# Patient Record
Sex: Male | Born: 1965
Health system: Southern US, Community
[De-identification: ages and names within clinical notes are randomized; demographics above are authoritative.]

## PROBLEM LIST (undated history)

## (undated) DIAGNOSIS — L309 Dermatitis, unspecified: Secondary | ICD-10-CM

## (undated) DIAGNOSIS — E11621 Type 2 diabetes mellitus with foot ulcer: Secondary | ICD-10-CM

## (undated) DIAGNOSIS — I219 Acute myocardial infarction, unspecified: Secondary | ICD-10-CM

## (undated) DIAGNOSIS — M86171 Other acute osteomyelitis, right ankle and foot: Secondary | ICD-10-CM

## (undated) DIAGNOSIS — A419 Sepsis, unspecified organism: Secondary | ICD-10-CM

## (undated) DIAGNOSIS — T8859XA Other complications of anesthesia, initial encounter: Secondary | ICD-10-CM

## (undated) DIAGNOSIS — K219 Gastro-esophageal reflux disease without esophagitis: Secondary | ICD-10-CM

## (undated) DIAGNOSIS — M869 Osteomyelitis, unspecified: Secondary | ICD-10-CM

## (undated) DIAGNOSIS — Z22322 Carrier or suspected carrier of Methicillin resistant Staphylococcus aureus: Secondary | ICD-10-CM

## (undated) DIAGNOSIS — E162 Hypoglycemia, unspecified: Secondary | ICD-10-CM

## (undated) DIAGNOSIS — I739 Peripheral vascular disease, unspecified: Secondary | ICD-10-CM

## (undated) DIAGNOSIS — L97514 Non-pressure chronic ulcer of other part of right foot with necrosis of bone: Secondary | ICD-10-CM

## (undated) DIAGNOSIS — D649 Anemia, unspecified: Secondary | ICD-10-CM

## (undated) DIAGNOSIS — L03115 Cellulitis of right lower limb: Secondary | ICD-10-CM

## (undated) DIAGNOSIS — R5383 Other fatigue: Secondary | ICD-10-CM

## (undated) DIAGNOSIS — L97909 Non-pressure chronic ulcer of unspecified part of unspecified lower leg with unspecified severity: Secondary | ICD-10-CM

## (undated) DIAGNOSIS — T4145XA Adverse effect of unspecified anesthetic, initial encounter: Secondary | ICD-10-CM

## (undated) DIAGNOSIS — R6883 Chills (without fever): Secondary | ICD-10-CM

## (undated) DIAGNOSIS — N186 End stage renal disease: Secondary | ICD-10-CM

## (undated) DIAGNOSIS — I350 Nonrheumatic aortic (valve) stenosis: Secondary | ICD-10-CM

## (undated) DIAGNOSIS — I2510 Atherosclerotic heart disease of native coronary artery without angina pectoris: Secondary | ICD-10-CM

## (undated) DIAGNOSIS — E11628 Type 2 diabetes mellitus with other skin complications: Secondary | ICD-10-CM

## (undated) DIAGNOSIS — N289 Disorder of kidney and ureter, unspecified: Secondary | ICD-10-CM

## (undated) DIAGNOSIS — M719 Bursopathy, unspecified: Secondary | ICD-10-CM

## (undated) DIAGNOSIS — R112 Nausea with vomiting, unspecified: Secondary | ICD-10-CM

## (undated) DIAGNOSIS — R011 Cardiac murmur, unspecified: Secondary | ICD-10-CM

## (undated) DIAGNOSIS — Z9889 Other specified postprocedural states: Secondary | ICD-10-CM

## (undated) DIAGNOSIS — L089 Local infection of the skin and subcutaneous tissue, unspecified: Secondary | ICD-10-CM

## (undated) DIAGNOSIS — R0989 Other specified symptoms and signs involving the circulatory and respiratory systems: Secondary | ICD-10-CM

## (undated) DIAGNOSIS — I10 Essential (primary) hypertension: Secondary | ICD-10-CM

## (undated) DIAGNOSIS — I96 Gangrene, not elsewhere classified: Secondary | ICD-10-CM

## (undated) DIAGNOSIS — I272 Pulmonary hypertension, unspecified: Secondary | ICD-10-CM

## (undated) DIAGNOSIS — Z992 Dependence on renal dialysis: Secondary | ICD-10-CM

## (undated) DIAGNOSIS — N185 Chronic kidney disease, stage 5: Secondary | ICD-10-CM

## (undated) DIAGNOSIS — H338 Other retinal detachments: Secondary | ICD-10-CM

## (undated) DIAGNOSIS — R51 Headache: Secondary | ICD-10-CM

## (undated) DIAGNOSIS — Z8489 Family history of other specified conditions: Secondary | ICD-10-CM

## (undated) DIAGNOSIS — Z9289 Personal history of other medical treatment: Secondary | ICD-10-CM

## (undated) DIAGNOSIS — R9431 Abnormal electrocardiogram [ECG] [EKG]: Secondary | ICD-10-CM

## (undated) DIAGNOSIS — N2581 Secondary hyperparathyroidism of renal origin: Secondary | ICD-10-CM

## (undated) DIAGNOSIS — E785 Hyperlipidemia, unspecified: Secondary | ICD-10-CM

## (undated) DIAGNOSIS — L02611 Cutaneous abscess of right foot: Secondary | ICD-10-CM

## (undated) DIAGNOSIS — B957 Other staphylococcus as the cause of diseases classified elsewhere: Secondary | ICD-10-CM

## (undated) DIAGNOSIS — E669 Obesity, unspecified: Secondary | ICD-10-CM

## (undated) DIAGNOSIS — N259 Disorder resulting from impaired renal tubular function, unspecified: Secondary | ICD-10-CM

## (undated) DIAGNOSIS — E11319 Type 2 diabetes mellitus with unspecified diabetic retinopathy without macular edema: Secondary | ICD-10-CM

## (undated) DIAGNOSIS — M1990 Unspecified osteoarthritis, unspecified site: Secondary | ICD-10-CM

## (undated) DIAGNOSIS — E119 Type 2 diabetes mellitus without complications: Secondary | ICD-10-CM

## (undated) DIAGNOSIS — R079 Chest pain, unspecified: Secondary | ICD-10-CM

## (undated) DIAGNOSIS — R739 Hyperglycemia, unspecified: Secondary | ICD-10-CM

## (undated) HISTORY — DX: Cutaneous abscess of right foot: L02.611

## (undated) HISTORY — DX: Essential (primary) hypertension: I10

## (undated) HISTORY — DX: Carrier or suspected carrier of methicillin resistant Staphylococcus aureus: Z22.322

## (undated) HISTORY — DX: Other fatigue: R53.83

## (undated) HISTORY — PX: CARDIAC CATHETERIZATION: SHX172

## (undated) HISTORY — DX: Other specified symptoms and signs involving the circulatory and respiratory systems: R09.89

## (undated) HISTORY — DX: Non-pressure chronic ulcer of other part of right foot with necrosis of bone: L97.514

## (undated) HISTORY — DX: Chills (without fever): R68.83

## (undated) HISTORY — PX: EYE SURGERY: SHX253

## (undated) HISTORY — PX: SOFT TISSUE MASS EXCISION: SHX2419

## (undated) HISTORY — DX: Gangrene, not elsewhere classified: I96

## (undated) HISTORY — DX: Type 2 diabetes mellitus with other skin complications: E11.628

## (undated) HISTORY — DX: Type 2 diabetes mellitus with other skin complications: L08.9

## (undated) HISTORY — DX: Local infection of the skin and subcutaneous tissue, unspecified: L08.9

## (undated) SURGERY — Surgical Case
Anesthesia: *Unknown

---
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
## 2007-02-24 ENCOUNTER — Encounter: Payer: Self-pay | Admitting: Endocrinology

## 2007-08-16 ENCOUNTER — Ambulatory Visit (HOSPITAL_COMMUNITY): Admission: RE | Admit: 2007-08-16 | Discharge: 2007-08-16 | Payer: Self-pay | Admitting: Ophthalmology

## 2007-08-30 ENCOUNTER — Ambulatory Visit (HOSPITAL_COMMUNITY): Admission: RE | Admit: 2007-08-30 | Discharge: 2007-08-31 | Payer: Self-pay | Admitting: Ophthalmology

## 2007-09-01 ENCOUNTER — Emergency Department (HOSPITAL_COMMUNITY): Admission: EM | Admit: 2007-09-01 | Discharge: 2007-09-01 | Payer: Self-pay | Admitting: Emergency Medicine

## 2007-09-02 ENCOUNTER — Encounter: Payer: Self-pay | Admitting: Endocrinology

## 2007-09-02 ENCOUNTER — Emergency Department (HOSPITAL_COMMUNITY): Admission: EM | Admit: 2007-09-02 | Discharge: 2007-09-02 | Payer: Self-pay | Admitting: Family Medicine

## 2007-10-18 ENCOUNTER — Ambulatory Visit: Payer: Self-pay | Admitting: Endocrinology

## 2007-10-18 DIAGNOSIS — E119 Type 2 diabetes mellitus without complications: Secondary | ICD-10-CM

## 2007-10-18 DIAGNOSIS — I10 Essential (primary) hypertension: Secondary | ICD-10-CM | POA: Insufficient documentation

## 2007-10-18 DIAGNOSIS — B957 Other staphylococcus as the cause of diseases classified elsewhere: Secondary | ICD-10-CM

## 2007-10-18 HISTORY — DX: Essential (primary) hypertension: I10

## 2007-10-18 HISTORY — DX: Other staphylococcus as the cause of diseases classified elsewhere: B95.7

## 2007-10-18 LAB — CONVERTED CEMR LAB
BUN: 31 mg/dL — ABNORMAL HIGH (ref 6–23)
Chloride: 103 meq/L (ref 96–112)
Cholesterol: 226 mg/dL (ref 0–200)
Direct LDL: 92.5 mg/dL
GFR calc Af Amer: 57 mL/min
Glucose, Bld: 305 mg/dL — ABNORMAL HIGH (ref 70–99)
HDL: 42.5 mg/dL (ref >39.0)
Hgb A1c MFr Bld: 11.2 % — ABNORMAL HIGH (ref 4.6–6.0)
Potassium: 4.9 meq/L (ref 3.5–5.1)
TSH: 1.17 microintl units/mL (ref 0.35–5.50)
Total CHOL/HDL Ratio: 5.3

## 2007-11-01 ENCOUNTER — Ambulatory Visit: Payer: Self-pay | Admitting: Endocrinology

## 2007-11-01 LAB — CONVERTED CEMR LAB
Protein, U semiquant: >=300
WBC Urine, dipstick: NEGATIVE
pH: 5

## 2008-05-10 ENCOUNTER — Encounter: Payer: Self-pay | Admitting: Internal Medicine

## 2008-05-16 ENCOUNTER — Ambulatory Visit: Payer: Self-pay | Admitting: Internal Medicine

## 2008-05-16 DIAGNOSIS — E785 Hyperlipidemia, unspecified: Secondary | ICD-10-CM

## 2008-05-16 DIAGNOSIS — E782 Mixed hyperlipidemia: Secondary | ICD-10-CM | POA: Insufficient documentation

## 2008-05-16 HISTORY — DX: Hyperlipidemia, unspecified: E78.5

## 2008-05-17 LAB — CONVERTED CEMR LAB
ALT: 22 units/L (ref 0–53)
AST: 16 units/L (ref 0–37)
Alkaline Phosphatase: 121 units/L — ABNORMAL HIGH (ref 39–117)
Bacteria, UA: NEGATIVE
Bilirubin Urine: NEGATIVE
Bilirubin, Direct: 0.1 mg/dL (ref 0.0–0.3)
CO2: 27 meq/L (ref 19–32)
Chloride: 102 meq/L (ref 96–112)
Cholesterol: 302 mg/dL (ref 0–200)
GFR calc non Af Amer: 42 mL/min
HDL: 45.5 mg/dL (ref >39.0)
Hemoglobin: 12 g/dL — ABNORMAL LOW (ref 13.0–17.0)
Hgb A1c MFr Bld: 10.1 % — ABNORMAL HIGH (ref 4.6–6.0)
Leukocytes, UA: NEGATIVE
Mucus, UA: NEGATIVE
PSA: 0.21 ng/mL (ref 0.10–4.00)
RBC: 4.37 M/uL (ref 4.22–5.81)
Total Bilirubin: 0.7 mg/dL (ref 0.3–1.2)
Total Protein: 6.9 g/dL (ref 6.0–8.3)
Urobilinogen, UA: 0.2 (ref 0.0–1.0)
VLDL: 127 mg/dL — ABNORMAL HIGH (ref 0–40)
pH: 5.5 (ref 5.0–8.0)

## 2008-05-19 LAB — CONVERTED CEMR LAB: Vit D, 1,25-Dihydroxy: 11 — ABNORMAL LOW (ref 30–89)

## 2008-06-29 ENCOUNTER — Ambulatory Visit: Payer: Self-pay | Admitting: Endocrinology

## 2008-06-29 DIAGNOSIS — L97909 Non-pressure chronic ulcer of unspecified part of unspecified lower leg with unspecified severity: Secondary | ICD-10-CM

## 2008-06-29 HISTORY — DX: Non-pressure chronic ulcer of unspecified part of unspecified lower leg with unspecified severity: L97.909

## 2008-07-03 ENCOUNTER — Telehealth (INDEPENDENT_AMBULATORY_CARE_PROVIDER_SITE_OTHER): Payer: Self-pay | Admitting: *Deleted

## 2008-07-06 ENCOUNTER — Telehealth (INDEPENDENT_AMBULATORY_CARE_PROVIDER_SITE_OTHER): Payer: Self-pay | Admitting: *Deleted

## 2008-07-06 ENCOUNTER — Emergency Department (HOSPITAL_COMMUNITY): Admission: EM | Admit: 2008-07-06 | Discharge: 2008-07-07 | Payer: Self-pay | Admitting: Emergency Medicine

## 2008-07-28 ENCOUNTER — Emergency Department (HOSPITAL_COMMUNITY): Admission: EM | Admit: 2008-07-28 | Discharge: 2008-07-28 | Payer: Self-pay | Admitting: Emergency Medicine

## 2008-07-29 ENCOUNTER — Telehealth (INDEPENDENT_AMBULATORY_CARE_PROVIDER_SITE_OTHER): Payer: Self-pay | Admitting: *Deleted

## 2008-07-29 ENCOUNTER — Emergency Department (HOSPITAL_COMMUNITY): Admission: EM | Admit: 2008-07-29 | Discharge: 2008-07-29 | Payer: Self-pay | Admitting: Emergency Medicine

## 2008-07-31 ENCOUNTER — Telehealth (INDEPENDENT_AMBULATORY_CARE_PROVIDER_SITE_OTHER): Payer: Self-pay | Admitting: *Deleted

## 2008-07-31 ENCOUNTER — Ambulatory Visit: Payer: Self-pay | Admitting: Internal Medicine

## 2008-07-31 DIAGNOSIS — N259 Disorder resulting from impaired renal tubular function, unspecified: Secondary | ICD-10-CM | POA: Insufficient documentation

## 2008-08-01 LAB — CONVERTED CEMR LAB
Cholesterol: 207 mg/dL (ref 0–200)
Direct LDL: 83.6 mg/dL
GFR calc Af Amer: 45 mL/min
GFR calc non Af Amer: 37 mL/min
HDL: 41.8 mg/dL (ref >39.0)
Hgb A1c MFr Bld: 10.6 % — ABNORMAL HIGH (ref 4.6–6.0)
Sodium: 137 meq/L (ref 135–145)
Triglycerides: 421 mg/dL (ref 0–149)

## 2008-08-09 ENCOUNTER — Encounter (INDEPENDENT_AMBULATORY_CARE_PROVIDER_SITE_OTHER): Payer: Self-pay | Admitting: Emergency Medicine

## 2008-08-09 ENCOUNTER — Ambulatory Visit: Payer: Self-pay | Admitting: Internal Medicine

## 2008-08-09 ENCOUNTER — Ambulatory Visit: Payer: Self-pay | Admitting: Vascular Surgery

## 2008-08-09 ENCOUNTER — Inpatient Hospital Stay (HOSPITAL_COMMUNITY): Admission: EM | Admit: 2008-08-09 | Discharge: 2008-08-13 | Payer: Self-pay | Admitting: Emergency Medicine

## 2008-08-13 ENCOUNTER — Encounter: Payer: Self-pay | Admitting: Endocrinology

## 2008-09-13 ENCOUNTER — Telehealth: Payer: Self-pay | Admitting: Internal Medicine

## 2008-10-05 ENCOUNTER — Encounter: Payer: Self-pay | Admitting: Internal Medicine

## 2008-11-13 ENCOUNTER — Ambulatory Visit: Payer: Self-pay | Admitting: Internal Medicine

## 2008-11-13 DIAGNOSIS — E162 Hypoglycemia, unspecified: Secondary | ICD-10-CM

## 2008-11-13 HISTORY — DX: Hypoglycemia, unspecified: E16.2

## 2008-12-11 LAB — CONVERTED CEMR LAB
Cholesterol: 265 mg/dL (ref 0–200)
Creatinine, Ser: 2.2 mg/dL — ABNORMAL HIGH (ref 0.4–1.5)
GFR calc Af Amer: 42 mL/min
GFR calc non Af Amer: 35 mL/min
HDL: 50.5 mg/dL (ref >39.0)
Hgb A1c MFr Bld: 8.5 % — ABNORMAL HIGH (ref 4.6–6.0)

## 2009-02-17 ENCOUNTER — Emergency Department (HOSPITAL_COMMUNITY): Admission: EM | Admit: 2009-02-17 | Discharge: 2009-02-17 | Payer: Self-pay | Admitting: Emergency Medicine

## 2009-02-17 ENCOUNTER — Encounter (INDEPENDENT_AMBULATORY_CARE_PROVIDER_SITE_OTHER): Payer: Self-pay | Admitting: Emergency Medicine

## 2009-02-17 ENCOUNTER — Ambulatory Visit: Payer: Self-pay | Admitting: Vascular Surgery

## 2009-05-22 ENCOUNTER — Telehealth: Payer: Self-pay | Admitting: Internal Medicine

## 2009-08-28 ENCOUNTER — Inpatient Hospital Stay (HOSPITAL_COMMUNITY): Admission: EM | Admit: 2009-08-28 | Discharge: 2009-09-04 | Payer: Self-pay | Admitting: Emergency Medicine

## 2009-09-03 ENCOUNTER — Ambulatory Visit: Payer: Self-pay | Admitting: Vascular Surgery

## 2009-09-03 ENCOUNTER — Encounter (INDEPENDENT_AMBULATORY_CARE_PROVIDER_SITE_OTHER): Payer: Self-pay | Admitting: Nephrology

## 2010-03-20 ENCOUNTER — Telehealth (INDEPENDENT_AMBULATORY_CARE_PROVIDER_SITE_OTHER): Payer: Self-pay | Admitting: *Deleted

## 2010-11-19 NOTE — Progress Notes (Signed)
  Phone Note Other Incoming   Request: Send information Summary of Call: Request for records received from [facility]. Request forwarded to [facility].     

## 2011-01-22 LAB — CBC
HCT: 24.1 % — ABNORMAL LOW (ref 39.0–52.0)
HCT: 25.7 % — ABNORMAL LOW (ref 39.0–52.0)
Hemoglobin: 8.5 g/dL — ABNORMAL LOW (ref 13.0–17.0)
Hemoglobin: 8.2 g/dL — ABNORMAL LOW (ref 13.0–17.0)
Hemoglobin: 8.9 g/dL — ABNORMAL LOW (ref 13.0–17.0)
Hemoglobin: 9.2 g/dL — ABNORMAL LOW (ref 13.0–17.0)
MCHC: 34.2 g/dL (ref 30.0–36.0)
MCHC: 34.3 g/dL (ref 30.0–36.0)
MCHC: 35 g/dL (ref 30.0–36.0)
MCHC: 35.1 g/dL (ref 30.0–36.0)
MCHC: 35.7 g/dL (ref 30.0–36.0)
MCV: 81.2 fL (ref 78.0–100.0)
MCV: 80.3 fL (ref 78.0–100.0)
MCV: 80.9 fL (ref 78.0–100.0)
MCV: 81.3 fL (ref 78.0–100.0)
MCV: 85.3 fL (ref 78.0–100.0)
MCV: 88.5 fL (ref 78.0–100.0)
Platelets: 560 10*3/uL — ABNORMAL HIGH (ref 150–400)
Platelets: 400 10*3/uL (ref 150–400)
Platelets: 418 10*3/uL — ABNORMAL HIGH (ref 150–400)
Platelets: 419 10*3/uL — ABNORMAL HIGH (ref 150–400)
Platelets: 458 10*3/uL — ABNORMAL HIGH (ref 150–400)
RBC: 3.16 MIL/uL — ABNORMAL LOW (ref 4.22–5.81)
RBC: 2.7 MIL/uL — ABNORMAL LOW (ref 4.22–5.81)
RBC: 2.92 MIL/uL — ABNORMAL LOW (ref 4.22–5.81)
RBC: 2.96 MIL/uL — ABNORMAL LOW (ref 4.22–5.81)
RBC: 3.33 MIL/uL — ABNORMAL LOW (ref 4.22–5.81)
RDW: 14.9 % (ref 11.5–15.5)
RDW: 13.9 % (ref 11.5–15.5)
RDW: 14.6 % (ref 11.5–15.5)
RDW: 14.9 % (ref 11.5–15.5)
RDW: 15.2 % (ref 11.5–15.5)
WBC: 10.4 10*3/uL (ref 4.0–10.5)
WBC: 11.7 10*3/uL — ABNORMAL HIGH (ref 4.0–10.5)
WBC: 12.3 10*3/uL — ABNORMAL HIGH (ref 4.0–10.5)
WBC: 14.1 10*3/uL — ABNORMAL HIGH (ref 4.0–10.5)
WBC: 16 10*3/uL — ABNORMAL HIGH (ref 4.0–10.5)

## 2011-01-22 LAB — URINALYSIS, ROUTINE W REFLEX MICROSCOPIC
Leukocytes, UA: NEGATIVE
Nitrite: NEGATIVE
Urobilinogen, UA: 0.2 mg/dL (ref 0.0–1.0)
pH: 5 (ref 5.0–8.0)

## 2011-01-22 LAB — GLUCOSE, CAPILLARY
Glucose-Capillary: 176 mg/dL — ABNORMAL HIGH (ref 70–99)
Glucose-Capillary: 184 mg/dL — ABNORMAL HIGH (ref 70–99)
Glucose-Capillary: 190 mg/dL — ABNORMAL HIGH (ref 70–99)
Glucose-Capillary: 133 mg/dL — ABNORMAL HIGH (ref 70–99)
Glucose-Capillary: 143 mg/dL — ABNORMAL HIGH (ref 70–99)
Glucose-Capillary: 148 mg/dL — ABNORMAL HIGH (ref 70–99)
Glucose-Capillary: 153 mg/dL — ABNORMAL HIGH (ref 70–99)
Glucose-Capillary: 155 mg/dL — ABNORMAL HIGH (ref 70–99)
Glucose-Capillary: 164 mg/dL — ABNORMAL HIGH (ref 70–99)
Glucose-Capillary: 165 mg/dL — ABNORMAL HIGH (ref 70–99)
Glucose-Capillary: 176 mg/dL — ABNORMAL HIGH (ref 70–99)
Glucose-Capillary: 177 mg/dL — ABNORMAL HIGH (ref 70–99)
Glucose-Capillary: 184 mg/dL — ABNORMAL HIGH (ref 70–99)
Glucose-Capillary: 186 mg/dL — ABNORMAL HIGH (ref 70–99)
Glucose-Capillary: 191 mg/dL — ABNORMAL HIGH (ref 70–99)
Glucose-Capillary: 198 mg/dL — ABNORMAL HIGH (ref 70–99)
Glucose-Capillary: 229 mg/dL — ABNORMAL HIGH (ref 70–99)
Glucose-Capillary: 243 mg/dL — ABNORMAL HIGH (ref 70–99)

## 2011-01-22 LAB — POCT I-STAT, CHEM 8
BUN: 49 mg/dL — ABNORMAL HIGH (ref 6–23)
Chloride: 104 mEq/L (ref 96–112)
Glucose, Bld: 183 mg/dL — ABNORMAL HIGH (ref 70–99)
Potassium: 5.2 mEq/L — ABNORMAL HIGH (ref 3.5–5.1)
Sodium: 131 mEq/L — ABNORMAL LOW (ref 135–145)

## 2011-01-22 LAB — COMPREHENSIVE METABOLIC PANEL
ALT: 26 U/L (ref 0–53)
ALT: 26 U/L (ref 0–53)
ALT: 33 U/L (ref 0–53)
ALT: 35 U/L (ref 0–53)
AST: 16 U/L (ref 0–37)
AST: 16 U/L (ref 0–37)
AST: 24 U/L (ref 0–37)
Albumin: 2.3 g/dL — ABNORMAL LOW (ref 3.5–5.2)
Albumin: 1.8 g/dL — ABNORMAL LOW (ref 3.5–5.2)
Albumin: 2.1 g/dL — ABNORMAL LOW (ref 3.5–5.2)
Albumin: 2.2 g/dL — ABNORMAL LOW (ref 3.5–5.2)
Albumin: 2.3 g/dL — ABNORMAL LOW (ref 3.5–5.2)
Alkaline Phosphatase: 166 U/L — ABNORMAL HIGH (ref 39–117)
BUN: 40 mg/dL — ABNORMAL HIGH (ref 6–23)
BUN: 41 mg/dL — ABNORMAL HIGH (ref 6–23)
BUN: 42 mg/dL — ABNORMAL HIGH (ref 6–23)
BUN: 47 mg/dL — ABNORMAL HIGH (ref 6–23)
CO2: 22 mEq/L (ref 19–32)
CO2: 22 mEq/L (ref 19–32)
CO2: 22 mEq/L (ref 19–32)
CO2: 23 mEq/L (ref 19–32)
Calcium: 9.2 mg/dL (ref 8.4–10.5)
Calcium: 8.6 mg/dL (ref 8.4–10.5)
Calcium: 8.7 mg/dL (ref 8.4–10.5)
Calcium: 8.7 mg/dL (ref 8.4–10.5)
Calcium: 8.8 mg/dL (ref 8.4–10.5)
Calcium: 8.8 mg/dL (ref 8.4–10.5)
Chloride: 102 mEq/L (ref 96–112)
Chloride: 104 mEq/L (ref 96–112)
Chloride: 105 mEq/L (ref 96–112)
Creatinine, Ser: 3.65 mg/dL — ABNORMAL HIGH (ref 0.4–1.5)
Creatinine, Ser: 3.87 mg/dL — ABNORMAL HIGH (ref 0.4–1.5)
Creatinine, Ser: 4.26 mg/dL — ABNORMAL HIGH (ref 0.4–1.5)
Creatinine, Ser: 4.34 mg/dL — ABNORMAL HIGH (ref 0.4–1.5)
Creatinine, Ser: 4.51 mg/dL — ABNORMAL HIGH (ref 0.4–1.5)
GFR calc Af Amer: 17 mL/min — ABNORMAL LOW (ref >60)
GFR calc Af Amer: 18 mL/min — ABNORMAL LOW (ref >60)
GFR calc Af Amer: 19 mL/min — ABNORMAL LOW (ref >60)
GFR calc Af Amer: 21 mL/min — ABNORMAL LOW (ref >60)
GFR calc non Af Amer: 14 mL/min — ABNORMAL LOW (ref >60)
GFR calc non Af Amer: 14 mL/min — ABNORMAL LOW (ref >60)
GFR calc non Af Amer: 15 mL/min — ABNORMAL LOW (ref >60)
Glucose, Bld: 191 mg/dL — ABNORMAL HIGH (ref 70–99)
Glucose, Bld: 151 mg/dL — ABNORMAL HIGH (ref 70–99)
Glucose, Bld: 183 mg/dL — ABNORMAL HIGH (ref 70–99)
Glucose, Bld: 217 mg/dL — ABNORMAL HIGH (ref 70–99)
Potassium: 4.4 mEq/L (ref 3.5–5.1)
Potassium: 4.6 mEq/L (ref 3.5–5.1)
Sodium: 131 mEq/L — ABNORMAL LOW (ref 135–145)
Sodium: 132 mEq/L — ABNORMAL LOW (ref 135–145)
Sodium: 133 mEq/L — ABNORMAL LOW (ref 135–145)
Sodium: 134 mEq/L — ABNORMAL LOW (ref 135–145)
Total Bilirubin: 0.5 mg/dL (ref 0.3–1.2)
Total Bilirubin: 0.9 mg/dL (ref 0.3–1.2)
Total Protein: 6.2 g/dL (ref 6.0–8.3)
Total Protein: 6.6 g/dL (ref 6.0–8.3)

## 2011-01-22 LAB — DIFFERENTIAL
Basophils Absolute: 0 10*3/uL (ref 0.0–0.1)
Basophils Relative: 0 % (ref 0–1)
Eosinophils Absolute: 0.3 10*3/uL (ref 0.0–0.7)
Eosinophils Absolute: 0.1 10*3/uL (ref 0.0–0.7)
Eosinophils Absolute: 0.2 10*3/uL (ref 0.0–0.7)
Eosinophils Absolute: 0.3 10*3/uL (ref 0.0–0.7)
Eosinophils Relative: 1 % (ref 0–5)
Eosinophils Relative: 1 % (ref 0–5)
Eosinophils Relative: 2 % (ref 0–5)
Lymphocytes Relative: 11 % — ABNORMAL LOW (ref 12–46)
Lymphocytes Relative: 9 % — ABNORMAL LOW (ref 12–46)
Lymphocytes Relative: 9 % — ABNORMAL LOW (ref 12–46)
Lymphs Abs: 0.8 10*3/uL (ref 0.7–4.0)
Lymphs Abs: 1.2 10*3/uL (ref 0.7–4.0)
Lymphs Abs: 1.4 10*3/uL (ref 0.7–4.0)
Lymphs Abs: 1.4 10*3/uL (ref 0.7–4.0)
Monocytes Absolute: 0.8 10*3/uL (ref 0.1–1.0)
Monocytes Absolute: 0.8 10*3/uL (ref 0.1–1.0)
Monocytes Absolute: 0.9 10*3/uL (ref 0.1–1.0)
Monocytes Absolute: 1.1 10*3/uL — ABNORMAL HIGH (ref 0.1–1.0)
Monocytes Relative: 5 % (ref 3–12)
Monocytes Relative: 6 % (ref 3–12)
Monocytes Relative: 7 % (ref 3–12)
Neutro Abs: 9.4 10*3/uL — ABNORMAL HIGH (ref 1.7–7.7)
Neutro Abs: 10.1 10*3/uL — ABNORMAL HIGH (ref 1.7–7.7)
Neutro Abs: 12 10*3/uL — ABNORMAL HIGH (ref 1.7–7.7)
Neutrophils Relative %: 81 % — ABNORMAL HIGH (ref 43–77)
Neutrophils Relative %: 83 % — ABNORMAL HIGH (ref 43–77)
Neutrophils Relative %: 84 % — ABNORMAL HIGH (ref 43–77)

## 2011-01-22 LAB — MAGNESIUM
Magnesium: 1.7 mg/dL (ref 1.5–2.5)
Magnesium: 1.7 mg/dL (ref 1.5–2.5)

## 2011-01-22 LAB — BRAIN NATRIURETIC PEPTIDE: Pro B Natriuretic peptide (BNP): 48.3 pg/mL (ref 0.0–100.0)

## 2011-01-22 LAB — PHOSPHORUS
Phosphorus: 4.7 mg/dL — ABNORMAL HIGH (ref 2.3–4.6)
Phosphorus: 4.4 mg/dL (ref 2.3–4.6)
Phosphorus: 5 mg/dL — ABNORMAL HIGH (ref 2.3–4.6)

## 2011-01-22 LAB — IRON AND TIBC
Iron: 22 ug/dL — ABNORMAL LOW (ref 42–135)
Saturation Ratios: 18 % — ABNORMAL LOW (ref 20–55)
TIBC: 121 ug/dL — ABNORMAL LOW (ref 215–435)
UIBC: 99 ug/dL

## 2011-01-22 LAB — LIPASE, BLOOD: Lipase: 16 U/L (ref 11–59)

## 2011-01-22 LAB — LIPID PANEL: VLDL: 30 mg/dL (ref 0–40)

## 2011-01-22 LAB — HEMOGLOBIN A1C: Hgb A1c MFr Bld: 7.2 % — ABNORMAL HIGH (ref 4.6–6.1)

## 2011-01-22 LAB — URINE CULTURE
Colony Count: NO GROWTH
Culture: NO GROWTH
Special Requests: NEGATIVE

## 2011-01-22 LAB — PTH, INTACT AND CALCIUM: Calcium, Total (PTH): 7.9 mg/dL — ABNORMAL LOW (ref 8.4–10.5)

## 2011-01-22 LAB — URINE MICROSCOPIC-ADD ON

## 2011-01-22 LAB — FERRITIN: Ferritin: 1050 ng/mL — ABNORMAL HIGH (ref 22–322)

## 2011-01-22 LAB — HEPATIC FUNCTION PANEL
Albumin: 2.5 g/dL — ABNORMAL LOW (ref 3.5–5.2)
Indirect Bilirubin: 0.6 mg/dL (ref 0.3–0.9)
Total Protein: 6.8 g/dL (ref 6.0–8.3)

## 2011-01-22 LAB — TSH: TSH: 1.369 u[IU]/mL (ref 0.350–4.500)

## 2011-01-22 LAB — CREATININE, URINE, RANDOM: Creatinine, Urine: 262.7 mg/dL

## 2011-03-04 NOTE — Op Note (Signed)
NAMERONAV, CLARY NO.:  [id number]   MEDICAL RECORD NO.:  [id number]          PATIENT TYPE:  AMB   LOCATION:  SDS                          FACILITY:  [facility]   PHYSICIAN:  Clent Demark. Rankin, M.D.   DATE OF BIRTH:  08-Nov-1965   DATE OF PROCEDURE:  08/16/2007  DATE OF DISCHARGE:                               OPERATIVE REPORT   PREOPERATIVE DIAGNOSES:  1. Eccentric macular hole, left eye.  2. Epiretinal membrane, which appears to be epiretinal.  3. History of progressive proliferative diabetic retinopathy, left      eye, with previous vitrectomy done elsewhere in [place].   POSTOPERATIVE DIAGNOSES:  1. Eccentric macular hole, left eye.  2. Epiretinal membrane, which appears to be epiretinal.  3. History of progressive proliferative diabetic retinopathy, left      eye, with previous vitrectomy done elsewhere in [place].   PROCEDURES:  1. Posterior vitrectomy and membrane peel, epiretinal membrane, left      eye 25 gauge.  2. Endolaser pan photocoagulation, left eye.  3. Injection of vitreous substitute, 20%, left eye.   SURGEON:  Clent Demark. Rankin, M.D.   ANESTHESIA:  Local retrobulbar anesthesia control.   INDICATIONS FOR PROCEDURE:  The patient is a 45 year old man who has  profound visual loss 20/250 because of eccentric macular hole with  severe topographic distortion from an epiretinal membrane centered over  the fovea with the macular hole along the inferior portion of the  papillomacular bundle.  The hole was approximately 800 microns by 400  microns in size.  There is subretinal fluid through the macular region  and fovea region because of this reason.  The patient understands this  is an attempt to remove the topographical distortion so as to allow  flattening of the foveal region and potential laser photocoagulation  with enlarged scatoma  in an attempt to try to flatten the foveal  region.  The patient understands the risks of anesthesia,  including the  remote occurrence of death, loss of the eye, including but not limited  to hemorrhage, infection, scarring, need for further surgery, no change  in vision, loss of vision, and progression of disease despite  intervention.  Appropriate signed consent was obtained.  The patient  taken to the operating room.   DESCRIPTION OF PROCEDURE:  In the operating room, appropriate monitors  followed by mild sedation.  Marcaine 0.75% was delivered 5 mL  retrobulbar to the left eye, followed by additional 5 mL laterally in  the fashion of a modified [employer].  The left periocular region was  sterilely prepped and draped in the usual ophthalmic fashion.  A lid  speculum was applied.  A 25-gauge trocar placed to inferotemporal  quadrant.  There was some discomfort so 1% Xylocaine, no epinephrine was  placed in the remaining superior quadrant.  The superior trocar was  applied.  Core vitrectomy was not necessary as it had been previously  done.  Vitrector was placed in the eye and there was no residual  vitreous noted.  A 25-guage horseshoe was then used to  engage a very  white, fibrotic, dense epiretinal tissue largely centered on the fovea  but inducing severe topographic distortion and enlargement of the  macular hole eccentrically placed in the fovea.  Excellent complete  removal of the membrane was noted.  This enhanced mobility of the foveal  macular region.  Fluid-air exchange completed.  Subretinal fluid was  aspirated using the flute needle.  At this time, endolaser  photocoagulation placed peripherally as well as around the macula hole  simply to help induce chorioretinal scarring.  Fluid-air exchange was  again completed and then an air-SF6, 20% exchange completed.  Superior  trocars were removed.  The infusion was removed.  Wounds were secure.  Subconjunctival Decadron applied.  A sterile patch and Fox shield  applied.  The patient was taken to the PACU in good and stable   condition.      Clent Demark Rankin, M.D.  Electronically Signed     [name]/MEDQ  D:  08/16/2007  T:  08/17/2007  Job:  [id number]

## 2011-03-04 NOTE — Discharge Summary (Signed)
NAMEJODEN, STOUDT NO.:  [id number]   MEDICAL RECORD NO.:  [id number]          PATIENT TYPE:  INP   LOCATION:  44                         [facility]ACILITY:  [facility]   PHYSICIAN:  Sean A. Loanne Drilling, MD    DATE OF BIRTH:  August 31, 1966   DATE OF ADMISSION:  08/09/2008  DATE OF DISCHARGE:  08/13/2008                               DISCHARGE SUMMARY   REASON FOR ADMISSION:  Cellulitis.   HISTORY OF PRESENT ILLNESS:  A 45 year old man admitted on August 09, 2008 with cellulitis of the left thigh.  Please refer to the dictated  history and physical for details.   HOSPITAL COURSE:  The patient was admitted and treated with intravenous  vancomycin, in view of his previous history of serious MRSA infection of  the left thigh.  He steadily improved and by August 13, 2008 he was  alert, oriented, afebrile, ambulatory and eating his usual diet and was  thus discharged home in good condition.   His diabetes was controlled with insulin during his hospitalization.  I  discussed with him the fact that he would need insulin at home.  And he  says he knows how to inject it.  He will start taking NovoLog 5 units  with each meal and continue his oral agents for now.   Other chronic medical problems were not active during this  hospitalization.   DIAGNOSES AT THE TIME OF DISCHARGE:  1. Cellulitis of the left thigh.  2. Other chronic medical problems as noted in history and physical.   DISCHARGE MEDICATIONS:  1. Doxycycline 100 mg twice a day 10 days.  2. NovoLog 5 units three times a day before meals.  3. Otherwise same as on history and physical.   FOLLOW UP:  Follow-up will be with me, Dr. Loanne Drilling, in 3-5 days.   SPECIAL DIET:  A reasonable diabetic diet is advised.   ACTIVITY:  Increase slowly.   SPECIAL INSTRUCTIONS:  The patient is to check blood sugar twice a day,  varying the time of day      Sean A. Loanne Drilling, MD  Electronically Signed    SAE/MEDQ  D:  08/13/2008   T:  08/13/2008  Job:  [id number]

## 2011-03-04 NOTE — Op Note (Signed)
[name], [name] NO.:  [id number]   MEDICAL RECORD NO.:  [id number]          PATIENT TYPE:  OIB   LOCATION:  5727                         FACILITY:  [facility]   PHYSICIAN:  [name], M.D.   DATE OF BIRTH:  04/10/1966   DATE OF PROCEDURE:  08/30/2007  DATE OF DISCHARGE:                               OPERATIVE REPORT   PREOPERATIVE DIAGNOSES:  1. Rhegmatogenous detachment, macula on, left eye, peripheral.  2. Status post recent successful closure of macular retinal detachment      and closure of large eccentric macular hole, left eye, with      improvement in visual acuity.   PROCEDURES:  1. Posterior vitrectomy and focal laser photocoagulation, air-fluid      exchange to repair complex retinal detachment, 25-gauge.  2. Injection of vitreous substitute, C3F8 10%   SURGEON:  Dominica Severin A. [name], M.D.   ANESTHESIA:  General endotracheal anesthesia.   INDICATIONS FOR PROCEDURE:  The patient is a 45 year old man who has  profound peripheral vision loss, threatening complete retinal detachment  of the left eye on the basis of peripheral retinal detachment.  He does  have excellent panretinal photocoagulation, which limited the spread  posteriorly; however, the peripheral retinal detachment extends from the  4:30 position nasally to the nasal 9 o'clock position in his left eye.  All this is anterior to the equator.  The patient understands this is an  attempt to reattach the retina.  He understands the possibility of  needing a scleral buckle as well as a vitrectomy.  The patient  understands the risks of anesthesia including the rare occurrence of  death but also to the eye from the condition as well as surgical repair,  including but not limited to hemorrhage, infection, scarring, need for  another surgery, no change in vision, loss of vision, and progression of  disease despite intervention.   After appropriate signed consent was obtained, the patient was  taken to  the operating room.  In the operating room appropriate monitoring was  followed by general endotracheal anesthesia.  The left periocular region  was sterilely prepped and draped in the usual ophthalmic fashion.  A lid  speculum applied.  A 25-gauge trocar placed in the inferotemporal  quadrant for the infusion.  Infusion turned on.  Superior trocar was  applied.  A core vitrectomy had been previously done.  At this time  scleral depression was then used inferiorly to assist in trimming the  vitreous base.  Significant pigmentary deposits were noted inferiorly.  Excellent mobilization of the retina was obtained.  Near the posterior  edge of the retinal detachment at the 6 o'clock position and also at the  4 o'clock position, small retinotomies were fashioned with the  vitrectomy port.  No complications occurred.  Fluid-fluid exchange  carried out.  This was followed by fluid-air exchange.  Thereafter a  soft-tip extrusion needle was then used to remove the subretinal fluid.  Preretinal and subretinal fluid was removed on repeated occasions.  This  allowed for excellent laser photocoagulation and retinopexy in the bed  of the attachment as well as around the retinotomy sites.  The retina  reattached nicely.  No complications occurred.  No residual traction was  noted.  At this time the superonasal trocar  was removed.  An air-C3F8 10% exchange completed.  The remaining  superior trocar removed as well as the infusion cannula.  Subconjunctival Decadron applied.  A sterile patch and Fox shield  applied.  Intraocular pressure assessed and found to be adequate.  The  patient was taken to the PACU in good, stable condition.      [name] Demark [name], M.D.  Electronically Signed     [name]/MEDQ  D:  08/30/2007  T:  08/31/2007  Job:  [id number]

## 2011-03-04 NOTE — H&P (Signed)
[name], [name] NO.:  [id number]   MEDICAL RECORD NO.:  [id number]          PATIENT TYPE:  EMS   LOCATION:  ED                           FACILITY:  [facility]   PHYSICIAN:  Farris Has, MDDATE OF BIRTH:  26-Apr-1966   DATE OF ADMISSION:  08/09/2008  DATE OF DISCHARGE:                              HISTORY & PHYSICAL   PRIMARY CARE [name]:  Dr. Jenny Reichmann.   CHIEF COMPLAINT:  Left leg pain and swelling.   The patient is a 45 year old gentleman with history of recurrent MRSA  infections, required abscess drainage in the past, who presents with a 1-  week history of left lower leg swelling and traveling redness.  This  started about 1-week ago.  At first, he had swelling in his distal lower  extremity, then it crept up, up to the mid thigh with now an area of  distinct induration right behind his knee with overall generalized  swelling of his leg, pain, warmth and redness in the particular area.  He has not had any fever, but has had some chills, and the week before  he had some nausea and had overall poor appetite, but otherwise review  of systems unremarkable.  No chest pain, no shortness of breath.   PAST MEDICAL HISTORY:  1. Diabetes.  2. Hypertension.  3. Hyperlipidemia.  4. Recurrent MRSA infections.  5. Chronic renal insufficiency, baseline creatinine around 2.   SOCIAL HISTORY:  The patient never smoked, does not drink.  Lives at  home, currently works from home, has wife.   FAMILY HISTORY:  Noncontributory.   ALLERGIES:  DILAUDID.   MEDICATIONS:  1. Crestor 20 mg p.o. daily.  2. Diltiazem, patient unsure of dose.  3. Metformin 500 twice a day.  4. Januvia 100 once a day.  5. Hyzaar 100/25 once a day.  6. Carvedilol 3.125 twice a day.  7. Glimepiride 4 once a day.   PHYSICAL EXAMINATION:  VITAL SIGNS:  Temperature 97.9, blood pressure  150/87, pulse 101, respirations 18.  Satting 97% on room air, currently  a rate down to 87.  GENERAL:   This is a slightly obese male sitting down in bed.  HEENT:  Head nontraumatic.  Somewhat dry mucous membranes.  SKIN:  Normal skin turgor.  NECK:  Supple.  No lymphadenopathy noted.  LUNGS:  Clear to auscultation bilaterally.  HEART:  Regular rate and rhythm.  No murmurs, rubs or gallops.  ABDOMEN:  Soft, nontender, nondistended.  LOWER EXTREMITIES:  Left side with trace edema.  There is an area of  induration and redness behind the left knee.  On the left foot, there is  a metatarsal head ulcer which does not appear to be infected.  NEUROLOGIC:  Otherwise intact.   LABORATORY DATA:  White blood cell count and 19.2, hemoglobin 11.9,  potassium initially was 5.8, now 4.4.  I think the first level was  hemolyzed.  Creatinine 2.2.   Chest x-ray within normal limits.  Left femur film cannot rule out  potential osteomyelitis in the left femur, possibly a chronic finding.  The patient had lower extremity venous Doppler, which did not show any  evidence of PE or DVT.   ASSESSMENT/PLAN:  This is a 45 year old gentleman with possible  cellulitis, could not rule out chronic osteomyelitis which is underlying  and explained the recurrence of methicillin resistant Staphylococcus  aureus infections.   1. Cellulitis versus osteomyelitis.  Will admit for IV antibiotics,      obtain MRI of the leg.  Obtain sed rate and CRP.  If true      osteomyelitis is noted, may need an infectious disease consult, but      will likely need at least 6 weeks of IV antibiotics and a      peripherally inserted central catheter line.  Will get blood      cultures.  At this point, I do not think there is a drainable      abscess, although some areas of induration in the future may need      surgical consult.  2. Diabetes.  Will do sliding scale.  Hold metformin in case he needs      some kind of imaging studies with contrast.  Hold glipizide as the      patient has had decreased p.o. intake.  Continue Januvia.   3. Hypertension.  Hold Hyzaar as the patient came in with somewhat      elevated potassium and seems to be a little dry, although I cannot      tell if potassium is actually __________.  His creatinine is also      coming up now to 2.2.  He has chronic renal insufficiency.  Will      add Norvasc and continue carvedilol.  I did not continue diltiazem      as he does not know his dose, and it is not as helpful for      hypertension management.  4. Chronic renal insufficiency.  It appears that BUN is slightly above      baseline.  Will give IV fluids and follow creatinine and BUN.  The      patient has been endorsing he had some decreased p.o. intake.  5. Hyperlipidemia.  Continue Crestor.  6. Prophylaxis.  Protonix plus Lovenox.   Dr. Gwendolyn Grant will resume care in the morning.      Farris Has, MD  Electronically Signed     AVD/MEDQ  D:  08/09/2008  T:  08/09/2008  Job:  [id number]   cc:   Jenny Reichmann, MD

## 2011-05-14 ENCOUNTER — Encounter (INDEPENDENT_AMBULATORY_CARE_PROVIDER_SITE_OTHER): Payer: Self-pay | Admitting: Surgery

## 2011-05-14 ENCOUNTER — Ambulatory Visit (INDEPENDENT_AMBULATORY_CARE_PROVIDER_SITE_OTHER): Payer: BC Managed Care – PPO | Admitting: Surgery

## 2011-05-14 VITALS — BP 178/102 | HR 80 | Temp 96.7°F | Ht 70.0 in | Wt 252.6 lb

## 2011-05-14 DIAGNOSIS — N259 Disorder resulting from impaired renal tubular function, unspecified: Secondary | ICD-10-CM

## 2011-05-14 DIAGNOSIS — K4090 Unilateral inguinal hernia, without obstruction or gangrene, not specified as recurrent: Secondary | ICD-10-CM

## 2011-05-14 NOTE — Patient Instructions (Signed)
Peritoneal Dialysis Patient Instructions Dialysis can be done using a machine outside of the body (hemodialysis). Or, it can be done inside the body (peritoneal dialysis). The word "peritoneal" refers to the lining or membrane of the belly (abdominal cavity). The peritoneal membrane is a thin, plastic-like lining inside the belly that covers the organs and fits in the abdominal or peritoneal cavity, such as the stomach, liver and the kidneys. This lining works like a filter. It will allow certain things to pass from your blood through the lining and into a special solution that has been placed into your belly. In this type of dialysis, the peritoneum is used to help clean the blood.  If you need dialysis, your kidneys are not working right. Healthy kidneys take out extra water and waste products, which becomes urine. When the kidneys do not do this, serious problems can develop. The waste and water build up in the blood. Your hands and feet might swell. You may feel tired, weak or sick to your stomach. Also, your blood pressure may rise. If not treated, you could die. Dialysis is a treatment that does the work that your kidneys would do if they were healthy.  It cleans your blood.   It will make sure your body has the right amount of certain chemicals that it needs. They include potassium, sodium and bicarbonate.   It will help control your blood pressure.  UNDERSTANDING PERITONEAL DIALYSIS  Here is how peritoneal dialysis works:   First, you will have surgery to put a soft plastic tube (catheter) into your belly (abdomen). This will allow you to easily connect yourself to special tubing, which will then let a special dialysis solution to be placed into your abdomen.   For each treatment, you will need at least one bag of dialysis solution (a liquid called dialysate). It is a mix of water that is pure and free of germs (sterile), sugar (dextrose) and the nutrients and minerals found in your blood.  Sometimes, more than one bag is needed to get the right amount of fluid for your abdomen. Your caregiver will explain what size and how many bags you will need.   The dialysate is slowly put through the catheter to fill the abdomen (called the peritoneal cavity). This dialysate will need to stay in your body for 3-4 hours. This is known as the dwell time.   The solution is working to clean the blood and remove wastes from your body. At the end of this time, the solution is drained from your body through tubing into an empty bag. It is then replaced with a fresh dialysate.   The draining and replacing of the dialysate is called an exchange or cycle. The catheter is capped after each exchange. Once the solution is in your body, you are then free to do whatever you would like until the next exchange. Most people will need to do 4-5 exchanges each day.   There are two different methods that can be used.   Continuous ambulatory peritoneal dialysis (CAPD): You put the solution into your abdomen, cap your catheter and then go about your day. Several hours later, you reconnect to a tubing set up, drain out the solution and then put more solution in. This is done several times a day. No machine is needed.   Continuous cycler-assisted peritoneal dialysis (CCPD): A machine is used, which fills the abdomen with dialysate and then drains it. This happens several times. It usually is done at night  while you are sleeping. When you wake up, you can disconnect from the machine and are free to go to go about your day.  PREPARING FOR EXCHANGES  Discuss the details of the procedure with your caregivers. You will be working with a nurse who is specially trained in doing dialysis. Make sure you understand:   How to do an exchange.   How much solution you need.   What type of solution you will need.   How often you should do an exchange. Ask:  1. How many times each day?  2. When? At meals? At bedtime?   Always  keep the dialysate bags and other supplies in a cool, clean and dry place.   Keeping everything clean is very important.   The catheter and its cap must be free from germs (sterile)   The adapter also must be sterile. It attaches the dialysis bag and tubing to the catheter.   Clean the area of your body around the catheter every day. Use a chemical that fights infection (antiseptic).   Wash your hands thoroughly before starting an exchange.   You may be taught to wear a mask to cover your nose and mouth. This makes infection less likely to happen.   You may be taught to close doors, windows and turn off any fans before doing an exchange.   Check the dialysate bag very carefully.   Make sure it is the right size bag for you. This information is on the label.   Also, make sure it is the right mixture. For some people, the dialysate contents vary. For instance, the mixture might be a stronger solution for overnight.   Check the expiration date (the last date you can use the bag). It also is on the label. If the date has gone by, throw away the bag.   The solution should be clear. You should be able to see any writing on the side of the bag clearly through the solution. Do not use a cloudy solution.   Gently squeeze the bag to make sure there are no leaks.   Use a dry heating pad to warm the dialysate in the bag. Leave the cover on the bag while you do this.   This is for comfort. You can skip this step if you want.   Never place the bag of solution under warm or hot water. Water from a faucet is not sterile and could cause germs to get into the bag. Infection could then result.  PERFORMING AN EXCHANGE  For continuous ambulatory dialysis:   Attach the dialysis bag and tubing to your catheter. Hang the bag so that gravity (the natural downward pull) draws the solution down and into your abdomen once the clamps are opened. This should take about 10 minutes.   Remove the bag and  tubing from the catheter. Cap the catheter.   The solution stays in the abdomen for 3-4 hours (dwell time). The solution is working to clean the blood and remove wastes from your body.   When you are ready to drain the solution for another exchange, take the cap off the catheter. Then, attach the catheter to tubing, which is attached to an empty bag. Place this empty bag below the abdomen or on the floor or stool and undo the clamps.   Gravity helps pull the fluid out of the abdomen and into the bag. The fluid in the bag may look yellow and clear, like urine. It usually takes  about 20 minutes to drain the fluid out of the abdomen.   When the solution has drained, start the process again by infusing a new bag of dialysate and then capping the catheter.   This should continue until you have used all of the solution that you are to use each day.   Sometimes, a small machine is used overnight. It is called a mini-cycler. This is done if the body cannot go all night without an exchange. The machine lets you sleep without having to get up and do an exchange.   For continuous cycler-assisted dialysis:   You will be taught how to set up or program your machine.   When you are ready for bed, put the dialysate bags onto the cycler machine. Put on exactly the number of bags that your caregiver said to use.   Connect your catheter to the machine and turn the cycler machine on.   Overnight, the cycler will do several exchanges. It often does three to five, sometimes more.   Solution that is in your abdomen in the morning will stay during the day. The machine is set to make the daytime solution stronger, if that is needed.   In the morning, you will disconnect from the machine and cap your catheter and go about your day.   Sometimes, an extra exchange is done during the day. This may be needed to remove excess waste or fluid.  IMPORTANT REMINDERS  You will need to follow a very strict schedule.  Every step of the dialysis procedure must be done every day. Sometimes, several times a day. Altogether, this might take an extra 2 hours or more. However, you must stick to the routine. Do not skip a day. Do not skip a procedure.   Some people find it helpful to work with a Social worker or Education officer, museum in addition to the renal (kidney) nurse. They can help you figure out how to change your daily routine to fit in the dialysis sessions.   You may need to change your diet. Ask your caregiver for advice, or talk with a nutritionist about what you should and should not eat.   You will need to weigh yourself every day and keep track of what your weight is.   You may be taught how to check your blood pressure before every exchange. Your blood pressure reading will help determine what type of solution to use. If your blood pressure is too high, you may need a stronger solution.  RISKS AND COMPLICATIONS Possible problems vary, depending on the method you use. Your overall health also can have an effect. Problems that could develop because of dialysis include:  Infection. This is the most common problem. It could occur:   In the peritoneum. This is called peritonitis.   Around the catheter.   Weight gain. The dialysate contains a type of sugar known as dextrose. Dextrose has a lot of calories. The body takes in several hundred calories from this sugar each day.   Weakened muscles in the abdomen. This can result from all of the fluid that your body has to hold in the abdomen.   Catheter replacement. Sometimes, a new one has to be put in.   Change in dialysis method. Due to some complications, you may need to change to hemodialysis for a short time and have your dialysis done at a center.   Trouble adjusting to your new lifestyle. In some people, this leads to depression.   Sleep problems.  Dialysis-related amyloidosis. This sometimes occurs after 5 years of dialysis. Protein builds up in the  blood. This can cause painful deposits on bones, joints and tendons (which connect muscle to bone). Or, it can cause hollow spots in bones that make them more likely to break.   Excess fluid. Your body may absorb too much of the fluid that is held in the abdomen. This can lead to heart or lung problems.  SEEK MEDICAL CARE IF:  You have any problems with an exchange.   The area around the catheter becomes red or painful.   The catheter seems loose, or it feels like it is coming out.   A bag of dialysate looks cloudy. Or, the liquid is an unusual color.   Abdominal pain or discomfort.   You feel sick to your stomach (nauseous) or throw up (vomit).   You develop a fever of more than 101.  SEEK IMMEDIATE MEDICAL CARE IF:  You develop a fever of more than 101.  Document Released: 08/03/2009 Document Re-Released: 10/28/2009 [facility] Patient Information 2011 [place].

## 2011-05-14 NOTE — Progress Notes (Signed)
Subjective:     Patient ID: [name], male   DOB: 25-May-1966, 45 y.o.   MRN: [id number]  HPI  The patient was sent to me from by Dr. Edrick Oh with nephrology for consideration of perineal dialysis catheter placement.  Patient has a history of hypertension, diabetes, nephritic syndrome. He's had worsening kidney function.  He is trying to get on the kidney transplant was [facility]. He had a mother who died of kidney failure and wishes to avoid any hemodialysis at this time. However he is interested in considering peritoneal dialysis. Therefore Dr. Justin Mend sent the patient for evaluation.  The patient has not had any abdominal surgeries. He does make urine. He occasionally of some mild chest pains but it is not exertional. He can walk 30 minutes without difficulty. He does have moderate leg swelling, but he uses diuretics. He comes today with his wife and children.  Review of Systems  Constitutional: Negative for fever, chills and diaphoresis.  HENT: Negative for sore throat and trouble swallowing.   Eyes: Negative for photophobia and visual disturbance.  Respiratory: Negative for choking and shortness of breath.   Cardiovascular: Negative for chest pain and palpitations.  Gastrointestinal: Negative for nausea, vomiting, abdominal pain, diarrhea, constipation, blood in stool, abdominal distention, anal bleeding and rectal pain.  Genitourinary: Negative for dysuria, urgency, frequency, hematuria, decreased urine volume, difficulty urinating and testicular pain.  Musculoskeletal: Positive for back pain. Negative for myalgias, arthralgias and gait problem.       Chronic LL back pain  Skin: Negative for color change and rash.       MRSA R arm infection 2007.  No infections since  Neurological: Negative for dizziness, speech difficulty, weakness and numbness.  Hematological: Negative for adenopathy.  Psychiatric/Behavioral: Negative for hallucinations, confusion and agitation.      Objective:   Physical Exam  Constitutional: He is oriented to person, place, and time. He appears well-developed and well-nourished. No distress.  HENT:  Head: Normocephalic.  Mouth/Throat: Oropharynx is clear and moist. No oropharyngeal exudate.  Eyes: Conjunctivae and EOM are normal. Pupils are equal, round, and reactive to light. No scleral icterus.  Neck: Normal range of motion. Neck supple. No tracheal deviation present.  Cardiovascular: Normal rate, regular rhythm and intact distal pulses.   Pulmonary/Chest: Effort normal and breath sounds normal. No respiratory distress.  Abdominal: Soft. He exhibits no distension. There is no tenderness. Hernia confirmed negative in the right inguinal area.       Beltline infraumb ~3cm at ASIS  Genitourinary:       Probable left inguinal hernia on Valsalva  Musculoskeletal: Normal range of motion. He exhibits edema. He exhibits no tenderness.       R forearm old scar.  No cellulitis.  3+ pitting edema BLE  Lymphadenopathy:    He has no cervical adenopathy.       Right: No inguinal adenopathy present.       Left: No inguinal adenopathy present.  Neurological: He is alert and oriented to person, place, and time. No cranial nerve deficit. He exhibits normal muscle tone. Coordination normal.  Skin: Skin is warm and dry. No rash noted. He is not diaphoretic. No erythema. No pallor.  Psychiatric: He has a normal mood and affect. His behavior is normal. Judgment and thought content normal.       Assessment:     Chronic kidney disease with worsening renal function. Need for dialysis or transplant imminent. Reasonable candidate for peritoneal dialysis.  Plan:     The patient Has received video education. I think he is a reasonable candidate can consider placement of a peritoneal dialysis catheter. I discussed the placement. I usually do this laparoscopically. Technique for this, risks  benefits alternatives were discussed. I stressed that the  long-term risks are associated with occlusion and infection. Need for hemodialysis as temporary or permanent support as is often needed. I cautioned him that he may need hemodialysis some point.   With the possibility of having a hernia I would start out with diagnostic laparoscopy. If he does have a hernia, I could repair it laparoscopically. Hopefully just on the left side or nonexistent.  If I do not repair that, then it will get worse with CAPD.  Given the fact that he is not dialysis dependent yet, hopefully we can get this set up and ready before he needs hemodialysis to save his life.   Continue to pursue possibility of transplant. Work on improving compliance with medications to minimize the progression of his kidney disease.Marland Kitchen

## 2011-05-23 ENCOUNTER — Other Ambulatory Visit (INDEPENDENT_AMBULATORY_CARE_PROVIDER_SITE_OTHER): Payer: Self-pay | Admitting: Surgery

## 2011-05-23 ENCOUNTER — Encounter (HOSPITAL_COMMUNITY)
Admission: RE | Admit: 2011-05-23 | Discharge: 2011-05-23 | Disposition: A | Discharge status: Home / Self Care | Payer: BC Managed Care – PPO | Source: Ambulatory Visit | Attending: Surgery | Admitting: Surgery

## 2011-05-23 ENCOUNTER — Ambulatory Visit (HOSPITAL_COMMUNITY)
Admission: RE | Admit: 2011-05-23 | Discharge: 2011-05-23 | Disposition: A | Discharge status: Home / Self Care | Payer: BC Managed Care – PPO | Source: Ambulatory Visit | Attending: Surgery | Admitting: Surgery

## 2011-05-23 DIAGNOSIS — Z01818 Encounter for other preprocedural examination: Secondary | ICD-10-CM | POA: Insufficient documentation

## 2011-05-23 DIAGNOSIS — Z01812 Encounter for preprocedural laboratory examination: Secondary | ICD-10-CM | POA: Insufficient documentation

## 2011-05-23 DIAGNOSIS — Q619 Cystic kidney disease, unspecified: Secondary | ICD-10-CM | POA: Insufficient documentation

## 2011-05-23 DIAGNOSIS — N189 Chronic kidney disease, unspecified: Secondary | ICD-10-CM

## 2011-05-23 LAB — BASIC METABOLIC PANEL
BUN: 74 mg/dL — ABNORMAL HIGH (ref 6–23)
CO2: 21 mEq/L (ref 19–32)
Calcium: 8.1 mg/dL — ABNORMAL LOW (ref 8.4–10.5)
Creatinine, Ser: 9.17 mg/dL — ABNORMAL HIGH (ref 0.50–1.35)
Glucose, Bld: 82 mg/dL (ref 70–99)

## 2011-05-23 LAB — CBC
MCH: 26.1 pg (ref 26.0–34.0)
MCV: 79 fL (ref 78.0–100.0)
Platelets: 220 10*3/uL (ref 150–400)
RBC: 2.72 MIL/uL — ABNORMAL LOW (ref 4.22–5.81)

## 2011-05-23 LAB — SURGICAL PCR SCREEN: MRSA, PCR: POSITIVE — AB

## 2011-05-26 ENCOUNTER — Other Ambulatory Visit: Payer: Self-pay | Admitting: Nephrology

## 2011-05-26 ENCOUNTER — Ambulatory Visit (HOSPITAL_COMMUNITY): Payer: BC Managed Care – PPO

## 2011-05-27 ENCOUNTER — Ambulatory Visit (HOSPITAL_COMMUNITY)
Admission: RE | Admit: 2011-05-27 | Discharge: 2011-05-27 | Disposition: A | Discharge status: Home / Self Care | Payer: BC Managed Care – PPO | Source: Ambulatory Visit | Attending: Surgery | Admitting: Surgery

## 2011-05-27 ENCOUNTER — Other Ambulatory Visit: Payer: Self-pay | Admitting: Nephrology

## 2011-05-27 DIAGNOSIS — I129 Hypertensive chronic kidney disease with stage 1 through stage 4 chronic kidney disease, or unspecified chronic kidney disease: Secondary | ICD-10-CM | POA: Insufficient documentation

## 2011-05-27 DIAGNOSIS — Z01810 Encounter for preprocedural cardiovascular examination: Secondary | ICD-10-CM | POA: Insufficient documentation

## 2011-05-27 DIAGNOSIS — Z01812 Encounter for preprocedural laboratory examination: Secondary | ICD-10-CM | POA: Insufficient documentation

## 2011-05-27 DIAGNOSIS — E119 Type 2 diabetes mellitus without complications: Secondary | ICD-10-CM | POA: Insufficient documentation

## 2011-05-27 DIAGNOSIS — N186 End stage renal disease: Secondary | ICD-10-CM

## 2011-05-27 DIAGNOSIS — Z01818 Encounter for other preprocedural examination: Secondary | ICD-10-CM | POA: Insufficient documentation

## 2011-05-27 DIAGNOSIS — N189 Chronic kidney disease, unspecified: Secondary | ICD-10-CM | POA: Insufficient documentation

## 2011-05-27 LAB — CROSSMATCH: ABO/RH(D): O POS

## 2011-05-27 LAB — POCT I-STAT 4, (NA,K, GLUC, HGB,HCT)
Glucose, Bld: 86 mg/dL (ref 70–99)
HCT: 21 % — ABNORMAL LOW (ref 39.0–52.0)
Hemoglobin: 7.1 g/dL — ABNORMAL LOW (ref 13.0–17.0)
Potassium: 4.8 mEq/L (ref 3.5–5.1)

## 2011-05-27 LAB — GLUCOSE, CAPILLARY: Glucose-Capillary: 119 mg/dL — ABNORMAL HIGH (ref 70–99)

## 2011-05-30 NOTE — Op Note (Signed)
[name], [name] NO.:  [id number]  MEDICAL RECORD NO.:  [id number]  LOCATION:  [facility]                         FACILITY:  [place] [facility]  PHYSICIAN:  Adin Hector, MD     DATE OF BIRTH:  Nov 06, 1965  DATE OF PROCEDURE:  05/27/2011 DATE OF DISCHARGE:                              OPERATIVE REPORT   NEPHROLOGIST:  Dr. Edrick Oh with [facility].  DIALYSIS NURSE:  Charlynn Court.  SURGEON:  Adin Hector, MD  ASSISTANT:  RN.  PREOPERATIVE DIAGNOSES: 1. 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 2. Possible left inguinal hernia.  POSTOPERATIVE DIAGNOSES: 1. 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 2. Possible left inguinal hernia. 3. No evidence of hernias.  PROCEDURE PERFORMED: 1. Diagnostic laparoscopy. 2. Laparoscopic-assisted placement of peritoneal dialysis catheter     ([place] shepherd's crook, right lower quadrant to pelvis).  SPECIMENS:  None.  DRAINS:  Peritoneal dialysis catheter as described above.  ESTIMATED BLOOD LOSS:  Minimal.  COMPLICATIONS:  None apparent.  INDICATIONS:  Mr. Schartner is a 45 year old male with hypertension, diabetes, and nephrotic syndrome who has had worsening kidney function over the years.  He is trying to get a kidney transplant through [facility].  He is trying to avoid hemodialysis.  He was interested in peritoneal dialysis.  He received education through [employer] and was felt to be a good candidate for placement of peritoneal dialysis catheter.  TECHNIQUE:  We discussed risk of injury, bleeding, infection, possible need for hemodialysis is discussed.  On exam, I was concerned of bulging that may be a hernia.  I recommended strongly diagnostic laparoscopy to make sure there are no hernias and if so they will need to be repaired as peritoneal dialysis will make this worse.  Risks, benefits, and alternatives discussed.   Questions answered.  He and his family agreed to proceed.  OPERATIVE FINDINGS:  No evidence of any inguinal hernias.  [place] peritoneal dialysis catheter.  Sheperd's crook rest in the true pelvis.  DESCRIPTION OF PROCEDURE:  Informed consent was confirmed.  The patient received IV cefazolin as he had not a MRSA infection in over 5 years. He underwent anesthesia without any difficulty.  He was positioned supine with arms tucked.  His abdomen and mons pubis were clipped, prepped, and draped in sterile fashion.  A surgical time-out confirmed our plan.  I placed a #5 mm port in the left mid abdomen using optical entry technique.  Entry was clean.  I reduced carbon dioxide insufflation. Under position, I placed a 5-mm port in the right mid abdomen.  I placed the patient in head down.  Diagnostic laparoscopy revealed no intraabdominal adhesions.  He had symmetrical direct space mild laxity but no true hernias.  No indirect hernias.  No femoral or obturator hernia.  Therefore I did not feel he required any hernia repairs with mesh.  I placed a 10-mm port in the right lower quadrant in the midclavicular line.  I angled it inferiorly and medially.  Tunnel was in oblique angle.  I placed a [place] catheter after it was flushed and placed that through the 10-mm dilating port  and emptied down to the pelvis.  It easily reached.  I had the cuff in the port and pulled that down.  I gently milked the catheter out such that the white ball rested in the peritoneal cavity and the wider cuff rested in the preperitoneal space with known exposure into the peritoneal cavity.  The catheter rested well.  I took a tunneler and tunneled the catheter from the right lower quadrant to the right suprapubic region and area premarked below his belt line.  I did have to open up the skin incision little bit to get the catheter come through as it would not come through with the tunneler by itself and I had to  grab it with a clamp.    The catheter aspirated and flushed well.  I made sure the small intestines were out of the pelvis and the catheter rested in the cul-de-sac and the true pelvis well. The catheter was angled way such that it would stay down there and not slip back up.  I went ahead and attached the rest of the catheter to the tubing.    I evacuated carbon dioxide.  I removed the 5- mm and 10-mm ports.  The 10-mm port was plugged up with the cuff and the catheter and I did not do more aggressive closure around it.  It was dilating the port anyway.  I closed the skin using 4-0 Monocryl stitch at the port site but put no stitches around the tubing.  I placed sterile dressings at the port site and at the exit point of the tubing. I then wrapped the catheter around with some gauze and placed Tegaderm on top of that.  The patient is being extubated and taken to recovery room in stable condition.  Because he is not on hemodialysis and has normal potassium levels and his anemia is stable and there is no bleeding; I feel safe for him to be discharged home as long as pain is under control.  He wishes that as well.  Anesthesia seem to agree at this point.  We will have him follow up with the peritoneal dialysis team, especially with Ms. Idolina Primer and their phone number which is [phone].     Adin Hector, MD     SCG/MEDQ  D:  05/27/2011  T:  05/27/2011  Job:  [id number]  Electronically Signed by Michael Boston MD on 05/30/2011 07:58:02 AM

## 2011-06-01 ENCOUNTER — Emergency Department (HOSPITAL_COMMUNITY): Payer: BC Managed Care – PPO

## 2011-06-01 ENCOUNTER — Emergency Department (HOSPITAL_COMMUNITY)
Admission: EM | Admit: 2011-06-01 | Discharge: 2011-06-02 | Disposition: A | Discharge status: Home / Self Care | Payer: BC Managed Care – PPO | Attending: Emergency Medicine | Admitting: Emergency Medicine

## 2011-06-01 DIAGNOSIS — N509 Disorder of male genital organs, unspecified: Secondary | ICD-10-CM | POA: Insufficient documentation

## 2011-06-01 DIAGNOSIS — N189 Chronic kidney disease, unspecified: Secondary | ICD-10-CM | POA: Insufficient documentation

## 2011-06-01 DIAGNOSIS — I129 Hypertensive chronic kidney disease with stage 1 through stage 4 chronic kidney disease, or unspecified chronic kidney disease: Secondary | ICD-10-CM | POA: Insufficient documentation

## 2011-06-01 DIAGNOSIS — R109 Unspecified abdominal pain: Secondary | ICD-10-CM | POA: Insufficient documentation

## 2011-06-01 DIAGNOSIS — E7800 Pure hypercholesterolemia, unspecified: Secondary | ICD-10-CM | POA: Insufficient documentation

## 2011-06-01 DIAGNOSIS — E119 Type 2 diabetes mellitus without complications: Secondary | ICD-10-CM | POA: Insufficient documentation

## 2011-06-01 DIAGNOSIS — R110 Nausea: Secondary | ICD-10-CM | POA: Insufficient documentation

## 2011-06-01 LAB — BASIC METABOLIC PANEL
BUN: 85 mg/dL — ABNORMAL HIGH (ref 6–23)
CO2: 19 mEq/L (ref 19–32)
Calcium: 8.4 mg/dL (ref 8.4–10.5)
GFR calc non Af Amer: 6 mL/min — ABNORMAL LOW (ref >60)
Glucose, Bld: 100 mg/dL — ABNORMAL HIGH (ref 70–99)

## 2011-06-01 LAB — URINALYSIS, ROUTINE W REFLEX MICROSCOPIC
Glucose, UA: NEGATIVE mg/dL
Ketones, ur: NEGATIVE mg/dL
Leukocytes, UA: NEGATIVE
Protein, ur: >300 mg/dL — AB
pH: 5.5 (ref 5.0–8.0)

## 2011-06-01 LAB — CBC
MCH: 27.4 pg (ref 26.0–34.0)
Platelets: 250 10*3/uL (ref 150–400)
RBC: 2.7 MIL/uL — ABNORMAL LOW (ref 4.22–5.81)
RDW: 15.5 % (ref 11.5–15.5)
WBC: 11.4 10*3/uL — ABNORMAL HIGH (ref 4.0–10.5)

## 2011-06-01 LAB — DIFFERENTIAL
Basophils Absolute: 0 10*3/uL (ref 0.0–0.1)
Eosinophils Absolute: 0 10*3/uL (ref 0.0–0.7)
Lymphocytes Relative: 4 % — ABNORMAL LOW (ref 12–46)
Monocytes Absolute: 0.5 10*3/uL (ref 0.1–1.0)
Neutrophils Relative %: 92 % — ABNORMAL HIGH (ref 43–77)

## 2011-06-01 LAB — URINE MICROSCOPIC-ADD ON

## 2011-06-01 LAB — HEPATIC FUNCTION PANEL
ALT: 5 U/L (ref 0–53)
AST: 14 U/L (ref 0–37)
Total Protein: 6.8 g/dL (ref 6.0–8.3)

## 2011-06-03 ENCOUNTER — Telehealth (INDEPENDENT_AMBULATORY_CARE_PROVIDER_SITE_OTHER): Payer: Self-pay

## 2011-06-03 NOTE — Telephone Encounter (Signed)
Called pt to find out how he was doing after being seen at the ER over the weekend with abdominal pain. The pt states he is feeling better and he doesn't feel that he needs an appt with Dr Johney Maine this wk but the pt has an appt with the dialysis center on Friday. I told the pt that I would talk to Dr Johney Maine this week and let him know how he is doing now and that if Dr Johney Maine wants to see him I went ahead and scheduled an appt for 06-10-11.Freida Busman

## 2011-06-05 ENCOUNTER — Telehealth (INDEPENDENT_AMBULATORY_CARE_PROVIDER_SITE_OTHER): Payer: Self-pay

## 2011-06-05 NOTE — Telephone Encounter (Signed)
LMOM for pt notifying him that I did discuss his sitiuation that occurred last weekend with going to the ER with abdominal pain. Per Dr Johney Maine if the catherter is working well and the dialysis ctr doesn't have any concerns about the cath then the pt doesn't need to see Dr Johney Maine next wk. I just left it up to the pt to call and cancel appt next wk after he is seen at the dialysis ctr.Freida Busman

## 2011-06-06 ENCOUNTER — Inpatient Hospital Stay (HOSPITAL_COMMUNITY)
Admission: EM | Admit: 2011-06-06 | Discharge: 2011-06-19 | DRG: 583 | Disposition: A | Discharge status: Home / Self Care | Payer: BC Managed Care – PPO | Attending: Nephrology | Admitting: Nephrology

## 2011-06-06 ENCOUNTER — Emergency Department (HOSPITAL_COMMUNITY): Payer: BC Managed Care – PPO

## 2011-06-06 DIAGNOSIS — D631 Anemia in chronic kidney disease: Secondary | ICD-10-CM | POA: Diagnosis present

## 2011-06-06 DIAGNOSIS — I120 Hypertensive chronic kidney disease with stage 5 chronic kidney disease or end stage renal disease: Secondary | ICD-10-CM | POA: Diagnosis present

## 2011-06-06 DIAGNOSIS — B9689 Other specified bacterial agents as the cause of diseases classified elsewhere: Secondary | ICD-10-CM | POA: Diagnosis present

## 2011-06-06 DIAGNOSIS — Y849 Medical procedure, unspecified as the cause of abnormal reaction of the patient, or of later complication, without mention of misadventure at the time of the procedure: Secondary | ICD-10-CM | POA: Diagnosis present

## 2011-06-06 DIAGNOSIS — E119 Type 2 diabetes mellitus without complications: Secondary | ICD-10-CM | POA: Diagnosis present

## 2011-06-06 DIAGNOSIS — N186 End stage renal disease: Secondary | ICD-10-CM | POA: Diagnosis present

## 2011-06-06 DIAGNOSIS — N2581 Secondary hyperparathyroidism of renal origin: Secondary | ICD-10-CM | POA: Diagnosis present

## 2011-06-06 DIAGNOSIS — K658 Other peritonitis: Secondary | ICD-10-CM | POA: Diagnosis present

## 2011-06-06 DIAGNOSIS — T8571XA Infection and inflammatory reaction due to peritoneal dialysis catheter, initial encounter: Principal | ICD-10-CM | POA: Diagnosis present

## 2011-06-06 DIAGNOSIS — H109 Unspecified conjunctivitis: Secondary | ICD-10-CM | POA: Diagnosis present

## 2011-06-06 DIAGNOSIS — B965 Pseudomonas (aeruginosa) (mallei) (pseudomallei) as the cause of diseases classified elsewhere: Secondary | ICD-10-CM | POA: Diagnosis present

## 2011-06-06 DIAGNOSIS — E785 Hyperlipidemia, unspecified: Secondary | ICD-10-CM | POA: Diagnosis present

## 2011-06-06 DIAGNOSIS — K660 Peritoneal adhesions (postprocedural) (postinfection): Secondary | ICD-10-CM | POA: Diagnosis present

## 2011-06-06 LAB — URINE MICROSCOPIC-ADD ON

## 2011-06-06 LAB — DIFFERENTIAL
Basophils Absolute: 0 10*3/uL (ref 0.0–0.1)
Basophils Relative: 0 % (ref 0–1)
Eosinophils Absolute: 0.1 10*3/uL (ref 0.0–0.7)
Neutro Abs: 3.3 10*3/uL (ref 1.7–7.7)
Neutrophils Relative %: 85 % — ABNORMAL HIGH (ref 43–77)

## 2011-06-06 LAB — POCT I-STAT, CHEM 8
BUN: 107 mg/dL — ABNORMAL HIGH (ref 6–23)
Calcium, Ion: 1.05 mmol/L — ABNORMAL LOW (ref 1.12–1.32)
HCT: 25 % — ABNORMAL LOW (ref 39.0–52.0)
Sodium: 138 mEq/L (ref 135–145)
TCO2: 20 mmol/L (ref 0–100)

## 2011-06-06 LAB — URINALYSIS, ROUTINE W REFLEX MICROSCOPIC
Bilirubin Urine: NEGATIVE
Nitrite: NEGATIVE
Specific Gravity, Urine: 1.021 (ref 1.005–1.030)
Urobilinogen, UA: 0.2 mg/dL (ref 0.0–1.0)
pH: 5 (ref 5.0–8.0)

## 2011-06-06 LAB — CBC
Hemoglobin: 8.2 g/dL — ABNORMAL LOW (ref 13.0–17.0)
Platelets: 345 10*3/uL (ref 150–400)
RBC: 3.08 MIL/uL — ABNORMAL LOW (ref 4.22–5.81)
WBC: 3.9 10*3/uL — ABNORMAL LOW (ref 4.0–10.5)

## 2011-06-06 LAB — GLUCOSE, CAPILLARY: Glucose-Capillary: 86 mg/dL (ref 70–99)

## 2011-06-06 LAB — MRSA PCR SCREENING: MRSA by PCR: NEGATIVE

## 2011-06-07 LAB — BASIC METABOLIC PANEL
Calcium: 8.4 mg/dL (ref 8.4–10.5)
GFR calc Af Amer: 6 mL/min — ABNORMAL LOW (ref >60)
GFR calc non Af Amer: 5 mL/min — ABNORMAL LOW (ref >60)
Sodium: 140 mEq/L (ref 135–145)

## 2011-06-07 LAB — CBC
HCT: 21.4 % — ABNORMAL LOW (ref 39.0–52.0)
Hemoglobin: 7 g/dL — ABNORMAL LOW (ref 13.0–17.0)
MCH: 27.2 pg (ref 26.0–34.0)
MCHC: 32.7 g/dL (ref 30.0–36.0)
MCHC: 33.9 g/dL (ref 30.0–36.0)
MCV: 80.1 fL (ref 78.0–100.0)
Platelets: 315 10*3/uL (ref 150–400)
RBC: 2.79 MIL/uL — ABNORMAL LOW (ref 4.22–5.81)

## 2011-06-07 LAB — GLUCOSE, CAPILLARY: Glucose-Capillary: 103 mg/dL — ABNORMAL HIGH (ref 70–99)

## 2011-06-07 LAB — FERRITIN: Ferritin: 365 ng/mL — ABNORMAL HIGH (ref 22–322)

## 2011-06-07 LAB — COMPREHENSIVE METABOLIC PANEL
ALT: 5 U/L (ref 0–53)
Alkaline Phosphatase: 122 U/L — ABNORMAL HIGH (ref 39–117)
BUN: 89 mg/dL — ABNORMAL HIGH (ref 6–23)
CO2: 18 mEq/L — ABNORMAL LOW (ref 19–32)
Calcium: 7.8 mg/dL — ABNORMAL LOW (ref 8.4–10.5)
GFR calc Af Amer: 6 mL/min — ABNORMAL LOW (ref >60)
GFR calc non Af Amer: 5 mL/min — ABNORMAL LOW (ref >60)
Glucose, Bld: 114 mg/dL — ABNORMAL HIGH (ref 70–99)
Sodium: 138 mEq/L (ref 135–145)

## 2011-06-07 LAB — HEPATITIS B CORE ANTIBODY, IGM: Hep B C IgM: NEGATIVE

## 2011-06-07 LAB — IRON: Iron: <10 ug/dL — ABNORMAL LOW (ref 42–135)

## 2011-06-08 ENCOUNTER — Inpatient Hospital Stay (HOSPITAL_COMMUNITY): Payer: BC Managed Care – PPO

## 2011-06-08 LAB — IRON AND TIBC
Iron: 15 ug/dL — ABNORMAL LOW (ref 42–135)
UIBC: 106 ug/dL

## 2011-06-08 LAB — BASIC METABOLIC PANEL
CO2: 21 mEq/L (ref 19–32)
Calcium: 8.2 mg/dL — ABNORMAL LOW (ref 8.4–10.5)
GFR calc non Af Amer: 5 mL/min — ABNORMAL LOW (ref >60)
Sodium: 140 mEq/L (ref 135–145)

## 2011-06-08 LAB — GLUCOSE, CAPILLARY: Glucose-Capillary: 93 mg/dL (ref 70–99)

## 2011-06-08 LAB — CBC
MCH: 26.7 pg (ref 26.0–34.0)
Platelets: 266 10*3/uL (ref 150–400)
RBC: 2.36 MIL/uL — ABNORMAL LOW (ref 4.22–5.81)

## 2011-06-08 LAB — VANCOMYCIN, RANDOM: Vancomycin Rm: 15.4 ug/mL

## 2011-06-09 LAB — CBC
Platelets: 300 10*3/uL (ref 150–400)
RBC: 2.36 MIL/uL — ABNORMAL LOW (ref 4.22–5.81)
WBC: 10.6 10*3/uL — ABNORMAL HIGH (ref 4.0–10.5)

## 2011-06-09 LAB — URINE CULTURE: Special Requests: NEGATIVE

## 2011-06-09 LAB — RENAL FUNCTION PANEL
CO2: 19 mEq/L (ref 19–32)
Chloride: 105 mEq/L (ref 96–112)
GFR calc Af Amer: 6 mL/min — ABNORMAL LOW (ref >60)
GFR calc non Af Amer: 5 mL/min — ABNORMAL LOW (ref >60)
Potassium: 4.8 mEq/L (ref 3.5–5.1)
Sodium: 138 mEq/L (ref 135–145)

## 2011-06-10 ENCOUNTER — Encounter (INDEPENDENT_AMBULATORY_CARE_PROVIDER_SITE_OTHER): Payer: BC Managed Care – PPO | Admitting: Surgery

## 2011-06-10 ENCOUNTER — Inpatient Hospital Stay (HOSPITAL_COMMUNITY): Payer: BC Managed Care – PPO

## 2011-06-10 DIAGNOSIS — R109 Unspecified abdominal pain: Secondary | ICD-10-CM

## 2011-06-10 DIAGNOSIS — K650 Generalized (acute) peritonitis: Secondary | ICD-10-CM

## 2011-06-10 DIAGNOSIS — K660 Peritoneal adhesions (postprocedural) (postinfection): Secondary | ICD-10-CM

## 2011-06-10 LAB — PROTIME-INR: INR: 1.4 (ref 0.00–1.49)

## 2011-06-10 LAB — CBC
HCT: 20.3 % — ABNORMAL LOW (ref 39.0–52.0)
Hemoglobin: 6.8 g/dL — CL (ref 13.0–17.0)
MCH: 26.9 pg (ref 26.0–34.0)
MCHC: 33.5 g/dL (ref 30.0–36.0)

## 2011-06-10 LAB — GLUCOSE, CAPILLARY: Glucose-Capillary: 118 mg/dL — ABNORMAL HIGH (ref 70–99)

## 2011-06-10 LAB — VANCOMYCIN, RANDOM: Vancomycin Rm: 29.4 ug/mL

## 2011-06-11 ENCOUNTER — Inpatient Hospital Stay (HOSPITAL_COMMUNITY): Payer: BC Managed Care – PPO

## 2011-06-11 LAB — CBC
MCH: 26.3 pg (ref 26.0–34.0)
MCHC: 32.7 g/dL (ref 30.0–36.0)
Platelets: 397 10*3/uL (ref 150–400)

## 2011-06-11 LAB — RENAL FUNCTION PANEL
Calcium: 8.5 mg/dL (ref 8.4–10.5)
GFR calc Af Amer: 6 mL/min — ABNORMAL LOW (ref >60)
GFR calc non Af Amer: 5 mL/min — ABNORMAL LOW (ref >60)
Glucose, Bld: 75 mg/dL (ref 70–99)
Phosphorus: 7.5 mg/dL — ABNORMAL HIGH (ref 2.3–4.6)
Sodium: 139 mEq/L (ref 135–145)

## 2011-06-11 LAB — BODY FLUID CULTURE

## 2011-06-12 ENCOUNTER — Inpatient Hospital Stay (HOSPITAL_COMMUNITY): Payer: BC Managed Care – PPO

## 2011-06-12 DIAGNOSIS — N186 End stage renal disease: Secondary | ICD-10-CM

## 2011-06-12 DIAGNOSIS — I120 Hypertensive chronic kidney disease with stage 5 chronic kidney disease or end stage renal disease: Secondary | ICD-10-CM

## 2011-06-12 LAB — CROSSMATCH
ABO/RH(D): O POS
Unit division: 0

## 2011-06-12 LAB — RENAL FUNCTION PANEL
Albumin: 2.2 g/dL — ABNORMAL LOW (ref 3.5–5.2)
Calcium: 8.5 mg/dL (ref 8.4–10.5)
Chloride: 104 mEq/L (ref 96–112)
Creatinine, Ser: 10.56 mg/dL — ABNORMAL HIGH (ref 0.50–1.35)
GFR calc non Af Amer: 5 mL/min — ABNORMAL LOW (ref >60)
Phosphorus: 7 mg/dL — ABNORMAL HIGH (ref 2.3–4.6)

## 2011-06-12 LAB — BODY FLUID CULTURE

## 2011-06-12 LAB — GLUCOSE, CAPILLARY: Glucose-Capillary: 105 mg/dL — ABNORMAL HIGH (ref 70–99)

## 2011-06-12 LAB — CBC
MCV: 81.3 fL (ref 78.0–100.0)
Platelets: 373 10*3/uL (ref 150–400)
RDW: 15.3 % (ref 11.5–15.5)
WBC: 12.2 10*3/uL — ABNORMAL HIGH (ref 4.0–10.5)

## 2011-06-13 ENCOUNTER — Inpatient Hospital Stay (HOSPITAL_COMMUNITY): Payer: BC Managed Care – PPO

## 2011-06-13 DIAGNOSIS — N186 End stage renal disease: Secondary | ICD-10-CM

## 2011-06-13 DIAGNOSIS — T82898A Other specified complication of vascular prosthetic devices, implants and grafts, initial encounter: Secondary | ICD-10-CM

## 2011-06-13 DIAGNOSIS — I120 Hypertensive chronic kidney disease with stage 5 chronic kidney disease or end stage renal disease: Secondary | ICD-10-CM

## 2011-06-13 DIAGNOSIS — N19 Unspecified kidney failure: Secondary | ICD-10-CM

## 2011-06-13 LAB — RENAL FUNCTION PANEL
Albumin: 2.3 g/dL — ABNORMAL LOW (ref 3.5–5.2)
BUN: 51 mg/dL — ABNORMAL HIGH (ref 6–23)
Calcium: 8.5 mg/dL (ref 8.4–10.5)
Creatinine, Ser: 8.91 mg/dL — ABNORMAL HIGH (ref 0.50–1.35)
Phosphorus: 6.2 mg/dL — ABNORMAL HIGH (ref 2.3–4.6)

## 2011-06-13 LAB — CBC
HCT: 24.4 % — ABNORMAL LOW (ref 39.0–52.0)
MCH: 26.2 pg (ref 26.0–34.0)
MCHC: 32 g/dL (ref 30.0–36.0)
MCV: 81.9 fL (ref 78.0–100.0)
RDW: 15 % (ref 11.5–15.5)

## 2011-06-13 LAB — GLUCOSE, CAPILLARY
Glucose-Capillary: 112 mg/dL — ABNORMAL HIGH (ref 70–99)
Glucose-Capillary: 115 mg/dL — ABNORMAL HIGH (ref 70–99)

## 2011-06-13 LAB — CARDIAC PANEL(CRET KIN+CKTOT+MB+TROPI): Relative Index: INVALID (ref 0.0–2.5)

## 2011-06-14 DIAGNOSIS — N186 End stage renal disease: Secondary | ICD-10-CM

## 2011-06-14 DIAGNOSIS — I120 Hypertensive chronic kidney disease with stage 5 chronic kidney disease or end stage renal disease: Secondary | ICD-10-CM

## 2011-06-14 LAB — CARDIAC PANEL(CRET KIN+CKTOT+MB+TROPI)
Relative Index: INVALID (ref 0.0–2.5)
Total CK: 44 U/L (ref 7–232)
Troponin I: <0.3 ng/mL (ref <0.30)
Troponin I: <0.3 ng/mL (ref <0.30)

## 2011-06-14 LAB — GLUCOSE, CAPILLARY: Glucose-Capillary: 118 mg/dL — ABNORMAL HIGH (ref 70–99)

## 2011-06-15 LAB — GLUCOSE, CAPILLARY
Glucose-Capillary: 94 mg/dL (ref 70–99)
Glucose-Capillary: 86 mg/dL (ref 70–99)

## 2011-06-15 LAB — CATH TIP CULTURE

## 2011-06-16 ENCOUNTER — Inpatient Hospital Stay (HOSPITAL_COMMUNITY): Payer: BC Managed Care – PPO

## 2011-06-16 ENCOUNTER — Telehealth (INDEPENDENT_AMBULATORY_CARE_PROVIDER_SITE_OTHER): Payer: Self-pay

## 2011-06-16 LAB — BASIC METABOLIC PANEL
BUN: 44 mg/dL — ABNORMAL HIGH (ref 6–23)
Calcium: 8.7 mg/dL (ref 8.4–10.5)
GFR calc Af Amer: 8 mL/min — ABNORMAL LOW (ref >60)
GFR calc non Af Amer: 6 mL/min — ABNORMAL LOW (ref >60)
Glucose, Bld: 92 mg/dL (ref 70–99)
Potassium: 3.9 mEq/L (ref 3.5–5.1)

## 2011-06-16 LAB — CBC
HCT: 24.1 % — ABNORMAL LOW (ref 39.0–52.0)
MCH: 26.5 pg (ref 26.0–34.0)
MCHC: 32 g/dL (ref 30.0–36.0)
RDW: 14.8 % (ref 11.5–15.5)

## 2011-06-16 NOTE — Telephone Encounter (Signed)
LMOM trying to check on pt per Dr Clyda Greener request b/c pt had to have his PD cath removed last wk.Requested pt to call me.Freida Busman

## 2011-06-16 NOTE — Consult Note (Addendum)
  [name], [name] NO.:  [id number]  MEDICAL RECORD NO.:  [id number]  LOCATION:  [id number]                         FACILITY:  [facility]  PHYSICIAN:  Theotis Burrow IV, MDDATE OF BIRTH:  02/02/1966  DATE OF CONSULTATION:  06/12/2011 DATE OF DISCHARGE:                                CONSULTATION   CHIEF COMPLAINT:  End-stage renal disease and peritonitis from PD catheter.  HISTORY OF PRESENT ILLNESS:  [name] is a 45 year old gentleman who is now on hemodialysis for end-stage renal disease through a right IJ tunneled dialysis catheter put in by Interventional Radiology.  The patient was admitted with peritonitis and had his PD catheter removed on June 10, 2011.  The patient was now amenable to hemodialysis and will need a more permanent access.  The patient is right-hand dominant.  He has a large surgical scar on the right forearm which he says is secondary to a MRSA infection.  PAST MEDICAL HISTORY: 1. Type 2 diabetes. 2. Hypertension. 3. End-stage renal disease. 4. Hypercholesterolemia. 5. History of MRSA.  MEDICATIONS:  As listed in HPI.  ALLERGIES:  PERCOCET causes itching.  SOCIAL HISTORY:  He is married.  He does not smoke, use alcohol, or drugs.  FAMILY HISTORY:  Positive for diabetes and hypertension.  REVIEW OF SYSTEMS:  On admission was positive for abdominal pain, nausea, fatigue, diarrhea, and lower extremity edema.  Negative for fever, chills, shortness of breath, chest pain, melena, or numbness or tingling in upper or lower extremities.  PHYSICAL EXAMINATION:  GENERAL:  This is a well-developed, well- nourished gentleman in no acute distress. VITAL SIGNS:  Temperature was 98.4, his blood pressure was 156/50, sats were 96% on room air. HEART:  Rate and rhythm was regular. EXTREMITIES:  The right forearm had a large surgical scar which was well- healed in the forearm.  Left upper extremity was warm and well-perfused. He had 2+ radial  pulse.  ASSESSMENT AND PLAN:  End-stage renal disease in a patient with peritonitis in the peritoneal dialysis catheter which was removed on this admission.  The patient has a history of methicillin-resistant Staphylococcus aureus infection with a large surgical scar in the anterior forearm on the right side.  We would like to use vein for an arteriovenous fistula if possible and secondary to the patient's history of methicillin-resistant Staphylococcus aureus infection and also his diabetes, vein mapping has been ordered for the left upper extremity and right upper arm.  Once the vein mapping is done, we will be able to assess whether the patient will need an arteriovenous fistula versus arteriovenous graft.  All of his and his wife's questions were answered regarding these procedures.  He will continue to dialyze through his tunnel catheter.  We will schedule this procedure once vein mapping is done.  This can also be scheduled as an outpatient as well.     Wray Kearns, PA-C   ______________________________ Clayton Bibles. Leia Alf, MD    RR/MEDQ  D:  06/12/2011  T:  06/13/2011  Job:  [id number]  Electronically Signed by Wray Kearns PA on 06/16/2011 10:23:24 AM Electronically Signed by Orvan Falconer IV MD on 06/20/2011 12:03:17 AM

## 2011-06-17 ENCOUNTER — Inpatient Hospital Stay (HOSPITAL_COMMUNITY): Payer: BC Managed Care – PPO

## 2011-06-17 DIAGNOSIS — I120 Hypertensive chronic kidney disease with stage 5 chronic kidney disease or end stage renal disease: Secondary | ICD-10-CM

## 2011-06-17 DIAGNOSIS — N186 End stage renal disease: Secondary | ICD-10-CM

## 2011-06-17 LAB — GLUCOSE, CAPILLARY: Glucose-Capillary: 104 mg/dL — ABNORMAL HIGH (ref 70–99)

## 2011-06-17 NOTE — H&P (Signed)
[name], VENZOR NO.:  [id number]  MEDICAL RECORD NO.:  [id number]  LOCATION:  [id number]                         FACILITY:  [facility]  PHYSICIAN:  Louis Meckel, M.D.DATE OF BIRTH:  08-23-1966  DATE OF ADMISSION:  06/06/2011 DATE OF DISCHARGE:                             HISTORY & PHYSICAL   HISTORY OF PRESENT ILLNESS:  [name] is a 45 year old black male with past medical history significant for diabetes mellitus, hypertension, hyperlipidemia, progressive CKD who underwent placement of a PDA catheter per [name] on May 27, 2011, in preparation to start PD. Apparently when the patient presented for flushing either dressing or something was not intact, started on p.o. antibiotics.  He then presented to the emergency department with abdominal pain on the 12th, had a CT scan which showed some edema along with mesenteric edema, probably just related to PDA catheter placement, but could not rule out diverticulitis.  His antibiotics were switched to Cipro and Flagyl. Today, when the patient presented for flushing, had excruciating pain with attempted flush.  Able to flush with fluid, but then unable to drain.  He has continued abdominal pain, went to the emergency department.  He denies fevers, but is having chills.  White blood count is 3.9.  The patient is anemic with a hemoglobin in the 8s and also has an elevated BUN and creatinine.  PAST MEDICAL HISTORY: 1. Diabetes mellitus, no meds. 2. Hypertension. 3. Hyperlipidemia. 4. New ESRD as above.  MEDICATIONS AT HOME: 1. Amlodipine 10 mg b.i.d. 2. Coreg 25 b.i.d. 3. Lasix 80 b.i.d. 4. Hydralazine 50 t.i.d. 5. Zemplar 1 mcg p.o. daily. 6. Kayexalate p.r.n. 7. Reglan 10 mg q.6 h. p.r.n. 8. Cipro. 9. Flagyl.  ALLERGIES:  To PERCOCET gives itching.  SOCIAL HISTORY:  No tobacco, alcohol or drug use.  FAMILY HISTORY:  Positive for diabetes and hypertension.  REVIEW OF SYSTEMS:  Positive for  abdominal pain, nausea, fatigue, diarrhea and lower extremity edema.  Negative for fevers, chills, shortness of breath, chest pain, melena or hematochezia.  Otherwise, review of systems is negative.  PHYSICAL EXAMINATION:  VITAL SIGNS:  Temperature 98.6, blood pressure 132/71, heart rate 75, respirations 18 and oxygen saturation is 100% on room air. GENERAL:  The patient seems to be uncomfortable, mental status plus 8 mg of morphine, still sleepy. HEENT:  Pupils are equal, round and reactive to light.  Extraocular motions are intact.  Mucous membranes are moist. NECK:  There is no jugular venous distention. LUNGS:  Mostly clear. CARDIOVASCULAR:  Tachycardic.  No murmur, gallop or rub. ABDOMEN:  Decreased breath sounds, diffusely tender, but mostly right lower quadrant in the area PDA cath exit site.  No fluctuation, no drainage. EXTREMITIES:  Mild lower extremity edema.  LABORATORY DATA:  White blood count 3.9, hemoglobin 8.2, potassium 5.3, BUN and creatinine 107 and 11.9.  ASSESSMENT:  A 45 year old black male with new end-stage renal disease now with nonfunctional PD catheter which may be infected?  He is also fairly uremic. 1. PDA cath unsure if infected, cannot give fluid to check.  We will     broaden antibiotics in attempt again in a.m. to see if we  can get     PDA catheter to flush and drain.  I called Dr. [name] with     [facility] surgery.  He was stated that it would not be     possible to manipulate PDA catheter this weekend and suggested to     follow up with [name]. 2. Anemia.  We will check iron stores, start Aranesp, the patient     trying to get transplant also.  We will try to avoid transfusion. 3. Uremia.  I am not sure indications to begin dialysis.  We will     reassess in the a.m. keep n.p.o. for possible p.c. placement. 4. Gastrointestinal will continue with p.r.n. pain and nausea     medications.           ______________________________ Louis Meckel, M.D.     KAG/MEDQ  D:  06/06/2011  T:  06/07/2011  Job:  [id number]  Electronically Signed by Corliss Parish M.D. on 06/17/2011 07:55:33 PM

## 2011-06-18 DIAGNOSIS — I120 Hypertensive chronic kidney disease with stage 5 chronic kidney disease or end stage renal disease: Secondary | ICD-10-CM

## 2011-06-18 DIAGNOSIS — N186 End stage renal disease: Secondary | ICD-10-CM

## 2011-06-18 HISTORY — PX: AV FISTULA PLACEMENT: SHX1204

## 2011-06-18 LAB — BASIC METABOLIC PANEL
BUN: 19 mg/dL (ref 6–23)
Chloride: 102 mEq/L (ref 96–112)
GFR calc Af Amer: 13 mL/min — ABNORMAL LOW (ref >60)
Glucose, Bld: 95 mg/dL (ref 70–99)
Potassium: 3.7 mEq/L (ref 3.5–5.1)

## 2011-06-18 LAB — CBC
HCT: 27 % — ABNORMAL LOW (ref 39.0–52.0)
Hemoglobin: 8.8 g/dL — ABNORMAL LOW (ref 13.0–17.0)
WBC: 10 10*3/uL (ref 4.0–10.5)

## 2011-06-18 LAB — GLUCOSE, CAPILLARY

## 2011-06-18 LAB — PROTIME-INR: INR: 1.14 (ref 0.00–1.49)

## 2011-06-18 LAB — APTT: aPTT: 46 seconds — ABNORMAL HIGH (ref 24–37)

## 2011-06-18 LAB — HEPATIC FUNCTION PANEL
AST: 14 U/L (ref 0–37)
Albumin: 2.4 g/dL — ABNORMAL LOW (ref 3.5–5.2)
Total Protein: 6.5 g/dL (ref 6.0–8.3)

## 2011-06-19 ENCOUNTER — Inpatient Hospital Stay (HOSPITAL_COMMUNITY): Payer: BC Managed Care – PPO

## 2011-06-19 LAB — CBC
Hemoglobin: 8.3 g/dL — ABNORMAL LOW (ref 13.0–17.0)
MCH: 26.3 pg (ref 26.0–34.0)
MCHC: 32.2 g/dL (ref 30.0–36.0)

## 2011-06-19 LAB — RENAL FUNCTION PANEL
BUN: 24 mg/dL — ABNORMAL HIGH (ref 6–23)
Calcium: 9 mg/dL (ref 8.4–10.5)
Creatinine, Ser: 6.78 mg/dL — ABNORMAL HIGH (ref 0.50–1.35)
GFR calc Af Amer: 11 mL/min — ABNORMAL LOW (ref >60)
Glucose, Bld: 88 mg/dL (ref 70–99)
Phosphorus: 4.9 mg/dL — ABNORMAL HIGH (ref 2.3–4.6)
Sodium: 140 mEq/L (ref 135–145)

## 2011-06-19 LAB — GLUCOSE, CAPILLARY: Glucose-Capillary: 93 mg/dL (ref 70–99)

## 2011-06-19 NOTE — Op Note (Signed)
[name], [name] NO.:  [id number]  MEDICAL RECORD NO.:  [id number]  LOCATION:  [id number]                         FACILITY:  [facility]  PHYSICIAN:  Adin Hector, MD     DATE OF BIRTH:  [birth year]  DATE OF PROCEDURE: DATE OF DISCHARGE:                              OPERATIVE REPORT   NEPHROLOGIST:  Sherril Croon, MD  SUBJECTIVE:  Adin Hector, MD  ASSISTANT:  RN.  PREOPERATIVE DIAGNOSIS:  Functional peritoneal dialysis catheter with pain probable peritonitis.  POSTOPERATIVE DIAGNOSIS:  Functional peritoneal dialysis catheter with pain probable peritonitis.  PROCEDURE PERFORMED: 1. Diagnostic laparoscopy. 2. Removal of peritoneal dialysis catheter. 3. Abdominal washout and lysis of adhesion.  ANESTHESIA: 1. General anesthesia. 2. Local anesthetic and field block.  SPECIMENS:  Peritoneal dialysis catheter sent for culture.  DRAINS:  None.  ESTIMATED BLOOD LOSS:  Minimal.  COMPLICATIONS:  None apparent.INDICATIONS:  Mr. Hippler is a 45 year old male who has had worsening renal function due to diabetes and hypertension.  His need for dialysis is deemed to be eminent by the [employer] primarily through Edrick Oh, MD.  The patient refused hemodialysis, but wished to have peritoneal dialysis.  After a long discussion, I placed it in laparoscopically a few weeks ago.  He had problems with catheter cap leaks and pain and inflammation.  While he is culture negative, he has purulence and he has not been able to tolerate any flushes.  At this point, they are concerned clinically to have infection and after not being able to preserve the catheter, with persistent inflammation after several days.  Dr. Justin Mend requested removal and the patient relented. Technique and removal was discussed.  Risks, benefits, alternatives were discussed.  Questions were answered, and the patient agreed to proceed.  OPERATIVE FINDINGS:  He had some adhesions on the  cecal mesentery near the peritoneal dialysis catheter.  The corkscrew rested in the true pelvis.  There were some inflammation and inflammatory pill suspicious for peritonitis.  He had no significant intraabdominal peritonitis, nor any major adhesions above the pelvic brim.  His colon was not involved. There was no evidence of any perforation or other abnormality.  He did not have any large abscess pocket.  DESCRIPTION OF THE PROCEDURE:  Informed consent was confirmed.  The patient underwent general anesthesia without any difficulty.  He was already on IV antibiotics.  He was positioned supine with left arm tucked.  His abdomen was prepped and draped and the peritoneal dialysis catheter was prepped and sealed.  A surgical time-out was placed and confirmed our plan.  I placed a #5-mm port in the left mid abdomen through the prior 5 mm puncture site using optimal entry technique.  Entry was clean.  I introduced carbon dioxide for insufflation.  Under direct visualization, I placed 5-mm ports through the umbilicus and 5-mm in the  right midabdomen through the prior port side.  I could see cecum adherent to the right anterior pelvis bladder.  I peeled that off, I could see the exit point at the entry point.  A peritoneal dialysis catheter coming through the abdominal wall, and easily pulled that out without any  resistance at all.  He had a little mild inflammation with the terminal ileum mesentry adherent more to sigmoid colon in an attempt to sort of wall off the lower pelvis.  I gently broke this up using more dissection and general blunt dissection due to free this off.  I irrigated the pelvis with over 6 liters of crystalloid.  He had minimal oozing.  I did not have any major abscess pocket, but I grossly irrigated where the catheter had been just to help clear it up.  I closed the PD catheter port site using 0 Vicryl  stitches and laparoscopic suture __________ leaking of air just to  ensure no later hernias.  I did camera inspection, there was no injury.  I evacuated carbon dioxide and removed the rest of the ports.  I closed the skin using 4-0 Monocryl stitch. Sterile dressing is applied.  The patient is being extubated and sent to recovery room in stable condition.  I am about to discuss the operative findings with the patient's wife.     Adin Hector, MD     SCG/MEDQ  D:  06/10/2011  T:  06/11/2011  Job:  [id number]  cc:   Sherril Croon, M.D.  Electronically Signed by Michael Boston MD on 06/19/2011 01:36:59 PM

## 2011-06-21 DIAGNOSIS — D509 Iron deficiency anemia, unspecified: Secondary | ICD-10-CM | POA: Insufficient documentation

## 2011-06-23 DIAGNOSIS — E7800 Pure hypercholesterolemia, unspecified: Secondary | ICD-10-CM | POA: Insufficient documentation

## 2011-06-23 NOTE — Op Note (Signed)
NAMECORNELIUS, ZEITZ NO.:  [id number]  MEDICAL RECORD NO.:  [id number]  LOCATION:  [id number]                         FACILITY:  [facility]  PHYSICIAN:  Conrad [place], MD       DATE OF BIRTH:  May 19, 1966  DATE OF PROCEDURE:  06/18/2011 DATE OF DISCHARGE:                              OPERATIVE REPORT   PROCEDURE:  Right brachiocephalic arteriovenous fistula placement.  PREOPERATIVE DIAGNOSES:  End-stage renal disease, requiring hemodialysis.  POSTOPERATIVE DIAGNOSES:  End-stage renal disease, requiring hemodialysis.  SURGEON:  Aaron Edelman L. Bridgett Larsson, M.D.  ASSISTANTS: 1. Evorn Gong, PA-C. 2. Wray Kearns, PA-C.  FINDINGS:  In this case included a palpable radial pulse and a palpable upper arm thrill at the end of the case.  SPECIMENS:  None.  ESTIMATED BLOOD LOSS:  Minimal.  INDICATIONS:  This is a 45 year old patient with end-stage renal disease.  Previously, he has already had his right tunneled dialysis catheter placed.  Vein mapping demonstrated only possibility of a fistula to the right upper arm.  He was subsequently recommended to proceed forward with a right brachiocephalic arteriovenous fistula.  He is aware of the risks of this procedure including bleeding, infection, possible steal syndrome, possible ischemic monomeric neuropathy, possible nerve damage, possible failure to mature, and possible need for additional procedures.  He is aware of these risks and agreed to proceed forward.  DESCRIPTION OF OPERATION:  After full informed written consent was obtained from the patient, he was brought back to the operating and placed supine upon the operating table.  Prior to induction, he received IV antibiotics, then prepped and draped in standard fashion for right arm access procedure.  I turned my attention first to the antecubital. I had previously marked out the brachial artery and what I saw was a cubital branch raising towards his cephalic vein.   Distance between the two was too wide to do this procedure with a single longitudinal incision, subsequently I elected to make a transverse incision on antecubital after injecting in total about 20 mL of a 1:1 mixture of 0.5% Marcaine without epinephrine 1% lidocaine with epinephrine.  I made the incision at the antecubital that was allowed me exposure to these two vessels.  Using blunt dissection and electrocautery, I developed some veins down to the brachial arteries.  This dissected out proximally and distally.  Note, there was a significant mount of scar tissue in this area. The adjacent brachial veins were quite small and thus a forearm loop graft will not be successful in this gentleman.  I then controlled this artery with vessel loops proximally and distally.  The brachial artery was about 4 mm in diameter externally.  I then dissected the subcutaneous tissue more laterally eventually came across the cubital vein.  This was dissected proximally and distally.  Note, there was a significant amount of scar tissue around this vein.  I basically had a cut it out of this scar, dissected down to the forearm to get some adequate length and then I clamped it distally, transected and tied off the distal vein with 2-0 silk then dissected this vein more proximally. Note that there was absolutely no blood  coming from this vein.  I serially interrogated this vein with serial dilators.  I accepted up to a 4 mm with room to spare.  Subsequently, I felt it was acceptable for a outflow vein, then subsequently reset my exposure to visualize the brachial artery.  I placed the brachial artery in tension proximally and distally with vessel loops and made arteriotomy with 11 blade and then I extended this arteriotomy proximally and distally for about a 4.5-mm arteriotomy.  The vein was then sewn in an end-to-side configuration with running stitch of 7-0 Prolene.  Prior to completing this anastomosis,  allowed the artery to back bleed proximally and distally. There was no obvious clot.  There continued to be no venous backbleeding.  I irrigated out this vein with heparinized saline and anastomosis completed.  Anastomosis was in the usual fashion and then I released the clamp.  Immediately, there was a strong thrill in the venous outflow.  Distally, there was a palpable radial pulse.  I placed thrombin and Gelfoam in the wound.  Controlled the few bleeding points with electrocautery;; but at the end of the case, there were no more active bleedings.  Subcutaneous tissue was reapproximated with running stitch of 3-0 Vicryl.  The skin was then reapproximated running subcuticular 4-0 Monocryl.  Skin closure was then reinforced with Dermabond.  The patient tolerated the procedure fine.  CONDITION:  Stable.  COMPLICATIONS:  None.     Conrad [place]ton, MD     BLC/MEDQ  D:  06/18/2011  T:  06/18/2011  Job:  [id number]  Electronically Signed by Adele Barthel MD on 06/23/2011 09:45:33 PM

## 2011-07-17 ENCOUNTER — Encounter: Payer: Self-pay | Admitting: Vascular Surgery

## 2011-07-18 ENCOUNTER — Encounter: Payer: Self-pay | Admitting: Vascular Surgery

## 2011-07-18 ENCOUNTER — Ambulatory Visit (INDEPENDENT_AMBULATORY_CARE_PROVIDER_SITE_OTHER): Payer: BC Managed Care – PPO | Admitting: Vascular Surgery

## 2011-07-18 VITALS — BP 134/80 | HR 76 | Temp 97.7°F | Ht 70.0 in | Wt 209.0 lb

## 2011-07-18 DIAGNOSIS — N186 End stage renal disease: Secondary | ICD-10-CM | POA: Insufficient documentation

## 2011-07-18 DIAGNOSIS — Z992 Dependence on renal dialysis: Secondary | ICD-10-CM

## 2011-07-18 HISTORY — DX: End stage renal disease: Z99.2

## 2011-07-18 HISTORY — DX: End stage renal disease: N18.6

## 2011-07-18 NOTE — Progress Notes (Signed)
[facility]  Postoperative Access Visit  History of Present Illness  [name] is a 45 y.o. year old male who presents for postoperative follow-up for: R BC AVF (Date: 06/18/2011 ).  The patient's wounds are  healed.  The patient notes no steal symptoms.  The patient is  able to complete their activities of daily living.  The patient's current symptoms are: none.  Physical Examination  Filed Vitals:   07/18/11 1321  BP: 134/80  Pulse: 76  Temp: 97.7 F (36.5 C)   RUE: Incision is healed, skin feels warm, hand grip is 5/5, sensation in digits is  intact, palpable thrill, bruit can  be auscultated, L BC AVF is > 6.0 mm throughout  [name] is a 45 y.o. year old male who presents s/p successful R BC AVF.  The patient's access will be ready for use in 2 weeks.  The patient's tunneled dialysis catheter can be removed after two successful cannulations and completed dialysis treatments.  Thank you for allowing [place] to participate in this patient's care.  Adele Barthel, MD [facility] Office: [phone] Pager: [phone]

## 2011-07-21 LAB — CBC
HCT: 34.2 — ABNORMAL LOW
HCT: 32.7 — ABNORMAL LOW
Hemoglobin: 10.8 — ABNORMAL LOW
MCHC: 34
MCHC: 33.2
MCV: 79.4
MCV: 80.5
Platelets: 199
Platelets: 262
RBC: 4.06 — ABNORMAL LOW
RDW: 14.6
RDW: 14.4
WBC: 8.9

## 2011-07-21 LAB — DIFFERENTIAL
Basophils Absolute: 0
Basophils Relative: 0
Eosinophils Absolute: 0
Eosinophils Relative: 0
Lymphocytes Relative: 6 — ABNORMAL LOW
Lymphocytes Relative: 6 — ABNORMAL LOW
Lymphs Abs: 0.6 — ABNORMAL LOW
Monocytes Absolute: 0.6
Monocytes Absolute: 0.3
Monocytes Relative: 4
Monocytes Relative: 3
Neutro Abs: 16.1 — ABNORMAL HIGH
Neutro Abs: 8.1 — ABNORMAL HIGH
Neutrophils Relative %: 91 — ABNORMAL HIGH
Neutrophils Relative %: 90 — ABNORMAL HIGH

## 2011-07-21 LAB — POCT I-STAT, CHEM 8
BUN: 25 — ABNORMAL HIGH
Chloride: 106
Creatinine, Ser: 1.9 — ABNORMAL HIGH
Potassium: 5.6 — ABNORMAL HIGH
Sodium: 135

## 2011-07-21 LAB — COMPREHENSIVE METABOLIC PANEL
Albumin: 3.6
BUN: 31 — ABNORMAL HIGH
Calcium: 9.3
Creatinine, Ser: 2.16 — ABNORMAL HIGH
Total Protein: 7.6

## 2011-07-21 LAB — BASIC METABOLIC PANEL
BUN: 22
CO2: 24
Calcium: 9.1
Chloride: 101
Creatinine, Ser: 1.73 — ABNORMAL HIGH
GFR calc Af Amer: 53 — ABNORMAL LOW
GFR calc non Af Amer: 44 — ABNORMAL LOW
Glucose, Bld: 354 — ABNORMAL HIGH
Potassium: 5.3 — ABNORMAL HIGH
Sodium: 132 — ABNORMAL LOW

## 2011-07-21 LAB — GLUCOSE, CAPILLARY
Glucose-Capillary: 278 — ABNORMAL HIGH
Glucose-Capillary: 292 — ABNORMAL HIGH

## 2011-07-21 LAB — URINALYSIS, ROUTINE W REFLEX MICROSCOPIC
Bilirubin Urine: NEGATIVE
Leukocytes, UA: NEGATIVE
Nitrite: NEGATIVE
Specific Gravity, Urine: 1.025
Urobilinogen, UA: 0.2

## 2011-07-21 LAB — URINE MICROSCOPIC-ADD ON

## 2011-07-22 LAB — POCT I-STAT, CHEM 8
Chloride: 110
Creatinine, Ser: 2.2 — ABNORMAL HIGH
Glucose, Bld: 114 — ABNORMAL HIGH
HCT: 35 — ABNORMAL LOW
Hemoglobin: 11.9 — ABNORMAL LOW
Potassium: 5.8 — ABNORMAL HIGH
Sodium: 137

## 2011-07-22 LAB — CULTURE, BLOOD (ROUTINE X 2): Culture: NO GROWTH

## 2011-07-22 LAB — GLUCOSE, CAPILLARY
Glucose-Capillary: 116 — ABNORMAL HIGH
Glucose-Capillary: 142 — ABNORMAL HIGH
Glucose-Capillary: 162 — ABNORMAL HIGH
Glucose-Capillary: 164 — ABNORMAL HIGH
Glucose-Capillary: 165 — ABNORMAL HIGH
Glucose-Capillary: 166 — ABNORMAL HIGH
Glucose-Capillary: 170 — ABNORMAL HIGH
Glucose-Capillary: 189 — ABNORMAL HIGH
Glucose-Capillary: 195 — ABNORMAL HIGH

## 2011-07-22 LAB — CK: Total CK: 47

## 2011-07-22 LAB — COMPREHENSIVE METABOLIC PANEL
ALT: 17
AST: 13
Albumin: 3.3 — ABNORMAL LOW
Alkaline Phosphatase: 86
Chloride: 104
GFR calc Af Amer: 48 — ABNORMAL LOW
Potassium: 4.4
Sodium: 135
Total Protein: 6.6

## 2011-07-22 LAB — CBC
Hemoglobin: 11.3 — ABNORMAL LOW
Hemoglobin: 9.5 — ABNORMAL LOW
MCHC: 33.2
MCHC: 34.4
Platelets: 270
RBC: 3.63 — ABNORMAL LOW
RBC: 4.23
RDW: 14.4
WBC: 7.6

## 2011-07-22 LAB — DIFFERENTIAL
Basophils Absolute: 0.2 — ABNORMAL HIGH
Eosinophils Relative: 0
Lymphocytes Relative: 9 — ABNORMAL LOW
Lymphs Abs: 1.7
Monocytes Relative: 2 — ABNORMAL LOW
Neutro Abs: 16.9 — ABNORMAL HIGH

## 2011-07-22 LAB — PROTIME-INR: INR: 1

## 2011-07-22 LAB — LIPID PANEL
Cholesterol: 139
HDL: 31 — ABNORMAL LOW
LDL Cholesterol: UNDETERMINED
Triglycerides: 497 — ABNORMAL HIGH

## 2011-07-22 LAB — VANCOMYCIN, TROUGH: Vancomycin Tr: 28

## 2011-07-22 LAB — BASIC METABOLIC PANEL
Calcium: 8.7
GFR calc Af Amer: 53 — ABNORMAL LOW
GFR calc non Af Amer: 44 — ABNORMAL LOW
Glucose, Bld: 169 — ABNORMAL HIGH
Potassium: 4.7
Sodium: 139

## 2011-07-29 LAB — CBC
Hemoglobin: 12.4 — ABNORMAL LOW
MCHC: 34.6
Platelets: 266
RDW: 15.3

## 2011-07-29 LAB — POCT I-STAT CREATININE: Creatinine, Ser: 2.1 — ABNORMAL HIGH

## 2011-07-29 LAB — I-STAT 8, (EC8 V) (CONVERTED LAB)
BUN: 47 — ABNORMAL HIGH
Bicarbonate: 27.1 — ABNORMAL HIGH
Glucose, Bld: 352 — ABNORMAL HIGH
Operator id: 247071
Sodium: 132 — ABNORMAL LOW
TCO2: 29
pCO2, Ven: 49.1

## 2011-07-29 LAB — BASIC METABOLIC PANEL
BUN: 41 — ABNORMAL HIGH
CO2: 25
Calcium: 9.3
GFR calc non Af Amer: 41 — ABNORMAL LOW
Glucose, Bld: 365 — ABNORMAL HIGH
Sodium: 130 — ABNORMAL LOW

## 2011-07-30 LAB — BASIC METABOLIC PANEL
CO2: 27
Chloride: 103
GFR calc Af Amer: 56 — ABNORMAL LOW
Potassium: 5.1

## 2011-07-30 LAB — CBC
HCT: 35.6 — ABNORMAL LOW
Hemoglobin: 12.1 — ABNORMAL LOW
MCHC: 34
MCV: 80
RBC: 4.46
WBC: 5.4

## 2011-08-13 NOTE — Discharge Summary (Signed)
[name], [name] NO.:  [id number]  MEDICAL RECORD NO.:  [id number]  LOCATION:  6712                         FACILITY:  [facility]  PHYSICIAN:  Donato Heinz, M.D.DATE OF BIRTH:  1966-07-05  DATE OF ADMISSION:  06/06/2011 DATE OF DISCHARGE:  06/19/2011                              DISCHARGE SUMMARY   DISCHARGE DIAGNOSES: 1. End-stage renal disease. 2. Peritonitis secondary to peritoneal dialysis catheter infection. 3. Diabetes mellitus type 2. 4. Hypertension. 5. Hyperlipidemia. 6. Conjunctivitis in the left eye.  DISCHARGE MEDICATIONS: 1. Tylenol 325 mg tablets, take 1-2 tablets every 6 hours as needed     for pain. 2. Calcium acetate 667 mg, take 1 tablet by mouth 3 times daily with     meals. 3. Ceftazidime 1 gram injection give with Tuesday, Thursday, Saturday     dialysis, last dose to be given on June 28, 2011 at dialysis. 4. Gatifloxacin 0.5% drop ophthalmology solution, 1 drop in left eye     twice daily, last dose on June 23, 2011. 5. Nepro 1 can 3 times daily as needed. 6. Protonix 40 mg tablets, take 1 tablet daily by mouth. 7. Tramadol 50 mg tablets, take 1-2 tablets by mouth every 6 hours as     needed for pain. 8. Amlodipine 10 mg tablet, take 1 tablet twice daily. 9. Coreg 25 mg, take 1 tablet twice daily. 10.Hydralazine 50 mg tablets, take 1 tablet 3 times daily by mouth. 11.Zemplar 1 mcg 1 tablet by mouth daily.  DISPOSITION AND FOLLOWUP:  [name] was discharged from [facility] in stable and improved condition.  He will have dialysis as an outpatient, his first treatment on Saturday, June 21, 2011 at the [facility].  At that time, he should be receiving his Tressie Ellis 1 gram with each dialysis session with the last dose given on June 28, 2011.  He has a followup appointment with Dr. Michael Boston in 2 weeks for followup of his PD catheter removal.  He also has a followup in 6  weeks with Dr. Bridgett Larsson in his office for a followup on his AV fistula placement.  He also should have a followup p.r.n. as needed with his nephrologist.  CONSULTATIONS DURING THIS ADMISSION: 1. [facility], Dr. Bridgett Larsson. 2. Dr. Michael Boston.  PROCEDURES PERFORMED:  He had a placement of a central catheter line by Dr. Bridgett Larsson done on June 10, 2011.  He had a portable abdominal x-ray which noted the peritoneal dialysis catheter coiled in the pelvis and unchanged in position since his prior CT.  He also had a portable chest x-ray done which showed markedly suboptimal inspiration and no acute cardiopulmonary disease.  He also underwent placement of an AV fistula in his right upper arm on June 18, 2011 by Dr. Bridgett Larsson.  ADMISSION HISTORY OF PRESENT ILLNESS:  [name] is a 45 year old black man with a past medical history significant for diabetes mellitus, hypertension, hyperlipidemia, and progressive CKD who underwent placement of a peritoneal dialysis catheter per [name] on May 27, 2011 in preparation for study properitoneal dialysis.  Apparently, when the patient presented for flushing, either the dressing or  stumping was not intact and was started on p.o. antibiotics.  He then presented to the emergency room with abdominal pain on June 01, 2011, had a CT scan which showed edema along with mesenteric edema probably related to the PD catheter placement, could not rule out diverticulitis.  His antibiotics were switched to Cipro and Flagyl.  Today, when the patient presented for flushing, had excruciating pain with attempted flush fluid but unable to drain.  He had continued abdominal pain and went to the emergency department.  He denied any fevers but does have chills.  White count is 3.9.  The patient is anemic with the hemoglobin in the 8s and also has an elevated BUN and creatinine.  ADMISSION PHYSICAL EXAMINATION:  VITAL SIGNS:  Temperature is 98.6, blood pressure is  132/71, heart rate is 75, respirations 18, oxygen saturation is 100% on room air. GENERAL:  The patient is uncomfortable. MENTAL STATUS:  Alert and oriented even with 8 mg of morphine and he is mildly sleepy. HEENT:  Pupils are equal, round, and reactive to light.  Extraocular movements are intact.  Mucous membranes are moist. NECK:  No JVD. LUNGS:  Mostly clear. CARDIOVASCULAR:  Tachycardic.  No murmurs, rubs, or gallops. ABDOMEN:  Decreased sounds, diffusely tender but centered mostly in the right lower quadrant to the area of the PD catheter exit site.  There is no fluctuance or drainage. EXTREMITIES:  Mild lower extremity edema.  ADMISSION LABORATORY DATA:  White count was 3.9 and hemoglobin 8.2. Potassium 5.3, BUN was 170, and creatinine was 11.9.  HOSPITAL COURSE BY PROBLEMS: 1. End-stage renal disease.  [name] on presentation was planning on     getting ready to start peritoneal analysis but was having a problem     with malfunctioning catheter.  He had previously refused to start     hemodialysis secondary to family members he has seen go through it     and did not want to go through it.  Thus, upon discussion with his     primary nephrologist, Dr. Justin Mend, he did relent and agreed to have a     central catheter placed for dialysis which was started on June 11, 2011.  He has undergone dialysis 6 times since admission with     the last on the morning of discharge and has tolerated dialysis     quite well.  He originally was refusing to have the AV fistula     placed for permanent access site for dialysis but when he was told     that he would not be accepted at a dialysis center here in     [place] without the AV fistula in place, he relented and allowed     [place] to place the AV fistula in his right upper arm on June 18, 2011.  He will be discharged to dialysis at the [facility] on a Tuesday, Thursday, Saturday schedule with his     first  dialysis treatment being June 21, 2011.  He is aware of     this.  He will be followed by his nephrologist there. 2. Peritonitis secondary to peritoneal dialysis catheter infection.     On admission, he was having exquisite pain in his right lower     quadrant and there was some purulence that was noted in the PD     catheter site.  Aspirates from the PD catheter  did not actually     grow anything but they continued to have problems with flushing and     pain, so the catheter was actually removed on June 10, 2011.     Catheter tip and cultures as well as cultures taken at the time of     removal of the catheter grew up abundant Serratia and Pseudomonas,     both of which were sensitive to ceftazidime.  He was originally     started on vancomycin and Zosyn on presentation but once the     sensitivities were back was changed over to [place].  He will plan     on getting Fortaz on Tuesday, Thursday, Saturday with dialysis 1     gram with his last dose being June 28, 2011 for treatment of     the infection that was noted.  At the time of discharge, his     abdominal pain has completely resolved and his wounds from the PD     catheter site have been healing quite well. 3. Diabetes.  He was monitored with q.6 t.i.d. and at bedtime blood     sugars and actually has not required any insulin during his stay.     He is not on any outside medications, so he is basically diet     controlled. 4. Hypertension.  Hypertension has improved greatly with dialysis.  He     continues on Norvasc 10 mg b.i.d., Coreg 25 mg b.i.d. as well as     hydralazine 50 mg t.i.d. p.o.  His blood pressures have been     fluctuating with his dialysis and likely to improve as more fluid     was removed.  He is down to 99.2 kg which is down from 114 kg on     admission. 5. Conjunctivitis.  On the morning of June 17, 2011, he complained     of some left eye pain with exudate as well as some itching.     Conjunctiva  was red and consistent with a conjunctivitis.  He was     started on gatifloxacin eye drops and this resolved by the next     day.  He is to continue the gatifloxacin eye drops 1 drop into his     left eye b.i.d. with his last dose being on June 23, 2011. 6. Secondary hyperparathyroidism.  He was maintained on Zemplar     throughout his admission 1 mcg and will be continued to monitor as     to address his dialysis unit. 7. Anemia secondary to chronic kidney disease.  On admission, his     hemoglobin was 8.2.  His iron stores were decreased and he was     given iron during his dialysis treatment IV and will need to have     this continually monitored at his outpatient dialysis center.  DISCHARGE VITAL SIGNS:  T-max was 98.6, blood pressure was 160/101, pulse was 89, respirations 20, and saturating 97% on room air.  DISCHARGE LABORATORY DATA:  A renal panel which showed sodium of 140, potassium 3.8, chloride 102, bicarb 28, glucose 88, BUN was 24, creatinine 6.78, albumin 2.5, calcium 9.0, and phosphorus 4.9.  CBC with white count of 9.0, hemoglobin 8.3, hematocrit 25.8, and the platelet count of 400.    ______________________________ Trish Fountain, MD   ______________________________ Donato Heinz, M.D.    CP/MEDQ  D:  06/19/2011  T:  06/19/2011  Job:  [id number]  cc:  [place]  Electronically Signed by Trish Fountain MD on 08/06/2011 01:38:32 PM Electronically Signed by Donato Heinz M.D. on 08/13/2011 08:39:12 AM

## 2011-10-15 ENCOUNTER — Other Ambulatory Visit: Payer: Self-pay

## 2011-10-15 DIAGNOSIS — T82898A Other specified complication of vascular prosthetic devices, implants and grafts, initial encounter: Secondary | ICD-10-CM

## 2011-10-29 ENCOUNTER — Encounter: Payer: Self-pay | Admitting: Thoracic Diseases

## 2011-10-30 ENCOUNTER — Ambulatory Visit (INDEPENDENT_AMBULATORY_CARE_PROVIDER_SITE_OTHER): Payer: BC Managed Care – PPO | Admitting: Vascular Surgery

## 2011-10-30 ENCOUNTER — Ambulatory Visit (INDEPENDENT_AMBULATORY_CARE_PROVIDER_SITE_OTHER): Payer: BC Managed Care – PPO | Admitting: Thoracic Diseases

## 2011-10-30 ENCOUNTER — Encounter: Payer: Self-pay | Admitting: Thoracic Diseases

## 2011-10-30 VITALS — BP 151/81 | HR 78 | Resp 20 | Ht 70.0 in | Wt 221.5 lb

## 2011-10-30 DIAGNOSIS — T82898A Other specified complication of vascular prosthetic devices, implants and grafts, initial encounter: Secondary | ICD-10-CM

## 2011-10-30 DIAGNOSIS — N186 End stage renal disease: Secondary | ICD-10-CM

## 2011-10-30 NOTE — Progress Notes (Signed)
[facility] [place]  Follow-Up hemodialysis access H&P   Date of Surgery: 06/18/11 Right brachiocephalic AVF Surgeon: Eudelia Bunch, MD HD Center: [address], MWF  HPI: THALES TREADWELL is a 46 y.o. male who is 4.5 months S/P creation/revision of right upper extremity Hemodialysis access. The patient states that some of the technicians have had difficulty accessing the fistula and caused a hematoma. He does have a catheter through which they can dialyze him. The patient denies symptoms of numbness, tingling, weakness and denies pain in the operative limb. Patient is here for post -op evaluation to assess healing and maturation of right AVF .  Pt is on hemodialysis through  right [facility] AVF or IJ catheter on MWF  Past Medical History  Diagnosis Date  . Chills     at night - sometimes  . Fatigue     loss of fatigue  . MRSA (methicillin resistant staph aureus) culture positive   . Diabetes mellitus   . Hypertension   . Poor circulation    Current Outpatient Prescriptions  Medication Sig Dispense Refill  . amLODipine (NORVASC) 10 MG tablet Take 10 mg by mouth 2 (two) times daily.        . carvedilol (COREG) 25 MG tablet Take 25 mg by mouth 2 (two) times daily with a meal.        . Cholecalciferol (VITAMIN D PO) Take 5,000 Int'l Units by mouth.        . fish oil-omega-3 fatty acids 1000 MG capsule Take 1,500 mg by mouth daily.        . hydrALAZINE (APRESOLINE) 50 MG tablet Take 50 mg by mouth 3 (three) times daily.        Marland Kitchen amoxicillin-clavulanate (AUGMENTIN) 500-125 MG per tablet       . ciprofloxacin (CIPRO) 500 MG tablet       . furosemide (LASIX) 80 MG tablet Take 80 mg by mouth 2 (two) times daily.        Marland Kitchen gentamicin (GARAMYCIN) 0.1 % cream       . HYDROcodone-acetaminophen (NORCO) 5-325 MG per tablet       . metoCLOPramide (REGLAN) 10 MG tablet       . metroNIDAZOLE (FLAGYL) 500 MG tablet       . mupirocin (BACTROBAN) 2 % ointment       . pantoprazole (PROTONIX)  40 MG tablet       . Sodium Polystyrene Sulfonate (KALEXATE) POWD       . traMADol (ULTRAM) 50 MG tablet        No Changes in social, family history or ROS since last surgical intervention.Marland Kitchen  Physical Examination  Filed Vitals:   10/30/11 1611  BP: 151/81  Pulse: 78  Resp: 20   A&O x 3 WDWN male in NAD. Gait Normal HEENT WNL Lungs clear ,no wheezes, rales or rhonchi Heart RRR without murmur or rub right upper extremity Incision is healed Skin color is normal, no cyanosis, jaundice, pallor or bruising, normal   2+ radial pulse - palpable Right hand is warm and well perfused Hand grip is 5/5 and sensation in digits is intact; There is a good thrill and good bruit in the AVF. The graft/fistula is easily palpable and of adequate size  Duplex 10/30/2011  Diameter > 0.7 Depth  < 0.42  There are 2 stenoses with increased velocities 1,007 just above the anastomosis and 594 - mid fistula with one branch of 35mm  More proximally  Assessment/Plan MUHAMMED STRUB is a 46 y.o. year old who is s/p creation/revision of left upper extremity Hemodialysis access. Difficulty accessing fistula per Dr. Marval Regal in this fistula with one competing branch and 2 areas of stenosis Will Schedule fistulogram of RUE AVF with Dr. Bridgett Larsson next week - may need revision vs angioplasty HD to use catheter until after Fistulogram  Clinic MD: [facility]

## 2011-10-30 NOTE — Progress Notes (Signed)
Rt UE brachiocephalic AVF duplex performed @ [facility] on 10/30/2011

## 2011-10-31 ENCOUNTER — Other Ambulatory Visit: Payer: Self-pay

## 2011-11-05 NOTE — Procedures (Unsigned)
VASCULAR LAB EXAM  INDICATION:  Nonmaturing arterial venous fistula.  HISTORY: Diabetes: Cardiac: Hypertension:  EXAM:  Right brachiocephalic arteriovenous fistula duplex.  IMPRESSION: 1. Elevated velocities present involving the distal upper arm venous     outflow with peak systolic velocity of A999333 cm/s. 2. Several areas of elevated velocity at the mid upper arm venous     outflow involving valve sinuses. 3. Branch present measuring 0.25 cm in diameter with peak systolic     velocity of 123456 cm/s. 4. Remainder of venous outflow appears patent.  ___________________________________________ Conrad Waynoka, MD  SH/MEDQ  D:  10/30/2011  T:  10/30/2011  Job:  [id number]

## 2011-11-06 ENCOUNTER — Ambulatory Visit (HOSPITAL_COMMUNITY)
Admission: RE | Admit: 2011-11-06 | Discharge: 2011-11-06 | Disposition: A | Discharge status: Home / Self Care | Payer: BC Managed Care – PPO | Source: Ambulatory Visit | Attending: Vascular Surgery | Admitting: Vascular Surgery

## 2011-11-06 ENCOUNTER — Other Ambulatory Visit: Payer: Self-pay

## 2011-11-06 ENCOUNTER — Encounter (HOSPITAL_COMMUNITY)
Admission: RE | Disposition: A | Discharge status: Home / Self Care | Payer: Self-pay | Source: Ambulatory Visit | Attending: Vascular Surgery

## 2011-11-06 DIAGNOSIS — I871 Compression of vein: Secondary | ICD-10-CM | POA: Insufficient documentation

## 2011-11-06 DIAGNOSIS — Y832 Surgical operation with anastomosis, bypass or graft as the cause of abnormal reaction of the patient, or of later complication, without mention of misadventure at the time of the procedure: Secondary | ICD-10-CM | POA: Insufficient documentation

## 2011-11-06 DIAGNOSIS — T82898A Other specified complication of vascular prosthetic devices, implants and grafts, initial encounter: Secondary | ICD-10-CM

## 2011-11-06 DIAGNOSIS — T82598A Other mechanical complication of other cardiac and vascular devices and implants, initial encounter: Secondary | ICD-10-CM | POA: Insufficient documentation

## 2011-11-06 HISTORY — PX: FISTULOGRAM: SHX5832

## 2011-11-06 SURGERY — PTA VENOUS
Laterality: Right

## 2011-11-06 MED ORDER — SODIUM CHLORIDE 0.9 % IV SOLN: 250.0000 mL | INTRAVENOUS | Status: DC | PRN

## 2011-11-06 MED ORDER — ACETAMINOPHEN 325 MG PO TABS: 650.0000 mg | ORAL_TABLET | ORAL | Status: DC | PRN

## 2011-11-06 MED ORDER — LIDOCAINE HCL (PF) 1 % IJ SOLN
INTRAMUSCULAR | Status: AC
  Filled 2011-11-06: qty 30

## 2011-11-06 MED ORDER — SODIUM CHLORIDE 0.9 % IJ SOLN: 3.0000 mL | INTRAMUSCULAR | Status: DC | PRN

## 2011-11-06 MED ORDER — HEPARIN (PORCINE) IN NACL 2-0.9 UNIT/ML-% IJ SOLN
INTRAMUSCULAR | Status: AC
  Filled 2011-11-06: qty 1000

## 2011-11-06 MED ORDER — SODIUM CHLORIDE 0.9 % IJ SOLN: 3.0000 mL | Freq: Two times a day (BID) | INTRAMUSCULAR | Status: DC

## 2011-11-06 MED ORDER — ONDANSETRON HCL 4 MG/2ML IJ SOLN: 4.0000 mg | Freq: Four times a day (QID) | INTRAMUSCULAR | Status: DC | PRN

## 2011-11-06 NOTE — H&P (View-Only) (Signed)
[facility] [place]  Follow-Up hemodialysis access H&P   Date of Surgery: 06/18/11 Right brachiocephalic AVF Surgeon: Eudelia Bunch, MD HD Center: [address], MWF  HPI: [name] is a 46 y.o. male who is 4.5 months S/P creation/revision of right upper extremity Hemodialysis access. The patient states that some of the technicians have had difficulty accessing the fistula and caused a hematoma. He does have a catheter through which they can dialyze him. The patient denies symptoms of numbness, tingling, weakness and denies pain in the operative limb. Patient is here for post -op evaluation to assess healing and maturation of right AVF .  Pt is on hemodialysis through  right [facility] AVF or IJ catheter on MWF  Past Medical History  Diagnosis Date  . Chills     at night - sometimes  . Fatigue     loss of fatigue  . MRSA (methicillin resistant staph aureus) culture positive   . Diabetes mellitus   . Hypertension   . Poor circulation    Current Outpatient Prescriptions  Medication Sig Dispense Refill  . amLODipine (NORVASC) 10 MG tablet Take 10 mg by mouth 2 (two) times daily.        . carvedilol (COREG) 25 MG tablet Take 25 mg by mouth 2 (two) times daily with a meal.        . Cholecalciferol (VITAMIN D PO) Take 5,000 Int'l Units by mouth.        . fish oil-omega-3 fatty acids 1000 MG capsule Take 1,500 mg by mouth daily.        . hydrALAZINE (APRESOLINE) 50 MG tablet Take 50 mg by mouth 3 (three) times daily.        Marland Kitchen amoxicillin-clavulanate (AUGMENTIN) 500-125 MG per tablet       . ciprofloxacin (CIPRO) 500 MG tablet       . furosemide (LASIX) 80 MG tablet Take 80 mg by mouth 2 (two) times daily.        Marland Kitchen gentamicin (GARAMYCIN) 0.1 % cream       . HYDROcodone-acetaminophen (NORCO) 5-325 MG per tablet       . metoCLOPramide (REGLAN) 10 MG tablet       . metroNIDAZOLE (FLAGYL) 500 MG tablet       . mupirocin (BACTROBAN) 2 % ointment       . pantoprazole (PROTONIX)  40 MG tablet       . Sodium Polystyrene Sulfonate (KALEXATE) POWD       . traMADol (ULTRAM) 50 MG tablet        No Changes in social, family history or ROS since last surgical intervention.Marland Kitchen  Physical Examination  Filed Vitals:   10/30/11 1611  BP: 151/81  Pulse: 78  Resp: 20   A&O x 3 WDWN male in NAD. Gait Normal HEENT WNL Lungs clear ,no wheezes, rales or rhonchi Heart RRR without murmur or rub right upper extremity Incision is healed Skin color is normal, no cyanosis, jaundice, pallor or bruising, normal   2+ radial pulse - palpable Right hand is warm and well perfused Hand grip is 5/5 and sensation in digits is intact; There is a good thrill and good bruit in the AVF. The graft/fistula is easily palpable and of adequate size  Duplex 10/30/2011  Diameter > 0.7 Depth  < 0.42  There are 2 stenoses with increased velocities 1,007 just above the anastomosis and 594 - mid fistula with one branch of 14mm  More proximally  Assessment/Plan [name] is a 46 y.o. year old who is s/p creation/revision of left upper extremity Hemodialysis access. Difficulty accessing fistula per Dr. Marval Regal in this fistula with one competing branch and 2 areas of stenosis Will Schedule fistulogram of RUE AVF with Dr. Bridgett Larsson next week - may need revision vs angioplasty HD to use catheter until after Fistulogram  Clinic MD: [facility]

## 2011-11-06 NOTE — Interval H&P Note (Signed)
--    [facility] [place]  History and Physical Update  The patient was interviewed and re-examined.  The patient's History and Physical has been reviewed and is unchanged.  There is no change in the plan of care.  Adele Barthel, MD [facility] [place] Office: [phone] Pager: [phone]  11/06/2011, 7:07 AM

## 2011-11-06 NOTE — Op Note (Addendum)
OPERATIVE NOTE   PROCEDURE: 1. right brachiocephalic arm arteriovenous fistula cannulation under ultrasound guidance 2. right arm fistulogram 3. Angioplasty of cephalic vein x 3 (6 mm x 40 mm, 8 mm x 20 mm)  PRE-OPERATIVE DIAGNOSIS: Malfunctioning right arteriovenous fistula  POST-OPERATIVE DIAGNOSIS: same as above   SURGEON: Adele Barthel, MD  ANESTHESIA: local  ESTIMATED BLOOD LOSS: 5 cc  FINDING(S): 1. Patent brachiocephalic fistula >6 mm throughout except one stenosis in mid-segment (resolved after angioplasty) 2. Two competing side branches  3. Widely patent right axillary, subclavian, and innominate veins 4. Patent superior vena cava  SPECIMEN(S):  None  CONTRAST: 40 cc  INDICATIONS: [name] is a 46 y.o. male who  presents with malfunctioning right brachiocephalic arteriovenous fistula.  The patient is scheduled for right arm fistulogram.  The patient is aware the risks include but are not limited to: bleeding, infection, thrombosis of the cannulated access, and possible anaphylactic reaction to the contrast.  The patient is aware of the risks of the procedure and elects to proceed forward.  DESCRIPTION: After full informed written consent was obtained, the patient was brought back to the angiography suite and placed supine upon the angiography table.  The patient was connected to monitoring equipment.  The right arm was prepped and draped in the standard fashion for a right arm fistulogram.  Under ultrasound guidance, the right brachiocephalic arteriovenous fistula was cannulated with a micropuncture needle.  The microwire was advanced into the fistula and the needle was exchanged for the a microsheath, which was lodged 2 cm into the access.  The wire was removed and the sheath was connected to the IV extension tubing.  Hand injections were completed to image the access from the antecubitum up to the level of axilla.  The central venous structures were also imaged by hand  injections.  Based on the images, this patient will need: possibly angioplasty mid-segment stenosis.  A Benson wire was passed through the sheath into the axillary vein and the sheath was exchanged for a 6-Fr short sheath.  The mid-segment stenosis was treated with a 6 mm x 40 mm angioplasty balloon at 15 atm for 1 minute.  There was residual stenosis >30% so I exchanged the balloon for 8 mm x 20 mm which was used to treat the stenosis at 10 atm for 1 minute.  There was residual stenosis >30% so I reinflated the balloon at 18 atm for 1 minute.  The stenosis was resolved on repeat hand injection.  A 4-0 Monocryl purse-string suture was sewn around the sheath.  The sheath was removed while tying down the suture.  A sterile bandage was applied to the puncture site.  Based on the images, I don't see any defects which make it difficult to cannulate this fistula.  COMPLICATIONS: none  CONDITION: stable  Adele Barthel, MD [facility] [place] Office: [phone] Pager: [phone]  11/06/2011 7:56 AM

## 2011-11-07 ENCOUNTER — Encounter (HOSPITAL_COMMUNITY): Payer: Self-pay

## 2011-11-07 LAB — POCT I-STAT, CHEM 8
Chloride: 103 mEq/L (ref 96–112)
HCT: 32 % — ABNORMAL LOW (ref 39.0–52.0)
Hemoglobin: 10.9 g/dL — ABNORMAL LOW (ref 13.0–17.0)
Potassium: 4.7 mEq/L (ref 3.5–5.1)
Sodium: 138 mEq/L (ref 135–145)

## 2011-12-19 ENCOUNTER — Other Ambulatory Visit (HOSPITAL_COMMUNITY): Payer: Self-pay | Admitting: Nephrology

## 2011-12-19 DIAGNOSIS — N186 End stage renal disease: Secondary | ICD-10-CM

## 2011-12-23 ENCOUNTER — Ambulatory Visit (HOSPITAL_COMMUNITY)
Admission: RE | Admit: 2011-12-23 | Discharge: 2011-12-23 | Disposition: A | Discharge status: Home / Self Care | Payer: BC Managed Care – PPO | Source: Ambulatory Visit | Attending: Nephrology | Admitting: Nephrology

## 2011-12-23 DIAGNOSIS — N186 End stage renal disease: Secondary | ICD-10-CM | POA: Insufficient documentation

## 2011-12-23 DIAGNOSIS — Z4901 Encounter for fitting and adjustment of extracorporeal dialysis catheter: Secondary | ICD-10-CM | POA: Insufficient documentation

## 2011-12-23 MED ORDER — CHLORHEXIDINE GLUCONATE 4 % EX LIQD
CUTANEOUS | Status: AC
  Filled 2011-12-23: qty 30

## 2011-12-23 NOTE — Procedures (Signed)
Right internal jugular tunneled hemodialysis catheter removal performed without immediate complications. Medications utilized- 1% lidocaine into skin and subcutaneous tissue. Gauze dressing applied to site following removal.

## 2011-12-25 ENCOUNTER — Telehealth (HOSPITAL_COMMUNITY): Payer: Self-pay

## 2012-01-20 ENCOUNTER — Encounter (HOSPITAL_COMMUNITY): Payer: Self-pay | Admitting: *Deleted

## 2012-01-20 ENCOUNTER — Other Ambulatory Visit: Payer: Self-pay

## 2012-01-20 ENCOUNTER — Emergency Department (HOSPITAL_COMMUNITY)
Admission: EM | Admit: 2012-01-20 | Discharge: 2012-01-20 | Disposition: A | Discharge status: Home / Self Care | Payer: BC Managed Care – PPO | Attending: Emergency Medicine | Admitting: Emergency Medicine

## 2012-01-20 DIAGNOSIS — I10 Essential (primary) hypertension: Secondary | ICD-10-CM | POA: Insufficient documentation

## 2012-01-20 DIAGNOSIS — N289 Disorder of kidney and ureter, unspecified: Secondary | ICD-10-CM | POA: Insufficient documentation

## 2012-01-20 DIAGNOSIS — R109 Unspecified abdominal pain: Secondary | ICD-10-CM | POA: Insufficient documentation

## 2012-01-20 DIAGNOSIS — R51 Headache: Secondary | ICD-10-CM

## 2012-01-20 DIAGNOSIS — R112 Nausea with vomiting, unspecified: Secondary | ICD-10-CM | POA: Insufficient documentation

## 2012-01-20 DIAGNOSIS — E119 Type 2 diabetes mellitus without complications: Secondary | ICD-10-CM | POA: Insufficient documentation

## 2012-01-20 DIAGNOSIS — Z79899 Other long term (current) drug therapy: Secondary | ICD-10-CM | POA: Insufficient documentation

## 2012-01-20 DIAGNOSIS — R0682 Tachypnea, not elsewhere classified: Secondary | ICD-10-CM | POA: Insufficient documentation

## 2012-01-20 HISTORY — DX: Disorder of kidney and ureter, unspecified: N28.9

## 2012-01-20 MED ORDER — ONDANSETRON 8 MG PO TBDP: 8.0000 mg | ORAL_TABLET | Freq: Two times a day (BID) | ORAL | Status: AC | PRN

## 2012-01-20 MED ORDER — DIPHENHYDRAMINE HCL 50 MG/ML IJ SOLN
25.0000 mg | Freq: Once | INTRAMUSCULAR | Status: AC
  Administered 2012-01-20: 25 mg via INTRAVENOUS
  Filled 2012-01-20: qty 1

## 2012-01-20 MED ORDER — METOCLOPRAMIDE HCL 5 MG/ML IJ SOLN
10.0000 mg | Freq: Once | INTRAMUSCULAR | Status: AC
  Administered 2012-01-20: 10 mg via INTRAVENOUS
  Filled 2012-01-20: qty 2

## 2012-01-20 MED ORDER — SODIUM CHLORIDE 0.9 % IV BOLUS (SEPSIS)
250.0000 mL | Freq: Once | INTRAVENOUS | Status: AC
  Administered 2012-01-20: 16:00:00 via INTRAVENOUS

## 2012-01-20 MED ORDER — MORPHINE SULFATE 2 MG/ML IJ SOLN
2.0000 mg | Freq: Once | INTRAMUSCULAR | Status: AC
  Administered 2012-01-20: 2 mg via INTRAVENOUS
  Filled 2012-01-20: qty 1

## 2012-01-20 MED ORDER — MORPHINE SULFATE 4 MG/ML IJ SOLN
4.0000 mg | Freq: Once | INTRAMUSCULAR | Status: AC
  Administered 2012-01-20: 4 mg via INTRAVENOUS
  Filled 2012-01-20: qty 1

## 2012-01-20 NOTE — ED Notes (Signed)
Pt also has dialysis

## 2012-01-20 NOTE — ED Notes (Signed)
Pt has dialysis M, W, F.  Pt did go today b/c he missed dialysis yesterday.  States he had 10lbs pulled off.

## 2012-01-20 NOTE — ED Notes (Signed)
Pt states he started to have a migraine this morning. Pt state he has not had a migraine since about 2010. Pt state he is having light sensativity . Pt also states he had several emesis this morning

## 2012-01-20 NOTE — ED Notes (Addendum)
States headache is returning. 4.5/10 on pain scale. Reported to Dr. Marisa Cyphers

## 2012-01-20 NOTE — ED Notes (Signed)
Given  Ginger  Ale

## 2012-01-20 NOTE — ED Notes (Signed)
Given saltine crackers and ginger ale

## 2012-01-20 NOTE — Discharge Instructions (Signed)
Nausea and Vomiting  Nausea is a sick feeling that often comes before throwing up (vomiting). Vomiting is a reflex where stomach contents come out of your mouth. Vomiting can cause severe loss of body fluids (dehydration). Children and elderly adults can become dehydrated quickly, especially if they also have diarrhea. Nausea and vomiting are symptoms of a condition or disease. It is important to find the cause of your symptoms.  CAUSES    Direct irritation of the stomach lining. This irritation can result from increased acid production (gastroesophageal reflux disease), infection, food poisoning, taking certain medicines (such as nonsteroidal anti-inflammatory drugs), alcohol use, or tobacco use.   Signals from the brain.These signals could be caused by a headache, heat exposure, an inner ear disturbance, increased pressure in the brain from injury, infection, a tumor, or a concussion, pain, emotional stimulus, or metabolic problems.   An obstruction in the gastrointestinal tract (bowel obstruction).   Illnesses such as diabetes, hepatitis, gallbladder problems, appendicitis, kidney problems, cancer, sepsis, atypical symptoms of a heart attack, or eating disorders.   Medical treatments such as chemotherapy and radiation.   Receiving medicine that makes you sleep (general anesthetic) during surgery.  DIAGNOSIS  Your caregiver may ask for tests to be done if the problems do not improve after a few days. Tests may also be done if symptoms are severe or if the reason for the nausea and vomiting is not clear. Tests may include:   Urine tests.   Blood tests.   Stool tests.   Cultures (to look for evidence of infection).   X-rays or other imaging studies.  Test results can help your caregiver make decisions about treatment or the need for additional tests.  TREATMENT  You need to stay well hydrated. Drink frequently but in small amounts.You may wish to drink water, sports drinks, clear broth, or eat frozen  ice pops or gelatin dessert to help stay hydrated.When you eat, eating slowly may help prevent nausea.There are also some antinausea medicines that may help prevent nausea.  HOME CARE INSTRUCTIONS    Take all medicine as directed by your caregiver.   If you do not have an appetite, do not force yourself to eat. However, you must continue to drink fluids.   If you have an appetite, eat a normal diet unless your caregiver tells you differently.   Eat a variety of complex carbohydrates (rice, wheat, potatoes, bread), lean meats, yogurt, fruits, and vegetables.   Avoid high-fat foods because they are more difficult to digest.   Drink enough water and fluids to keep your urine clear or pale yellow.   If you are dehydrated, ask your caregiver for specific rehydration instructions. Signs of dehydration may include:   Severe thirst.   Dry lips and mouth.   Dizziness.   Dark urine.   Decreasing urine frequency and amount.   Confusion.   Rapid breathing or pulse.  SEEK IMMEDIATE MEDICAL CARE IF:    You have blood or brown flecks (like coffee grounds) in your vomit.   You have black or bloody stools.   You have a severe headache or stiff neck.   You are confused.   You have severe abdominal pain.   You have chest pain or trouble breathing.   You do not urinate at least once every 8 hours.   You develop cold or clammy skin.   You continue to vomit for longer than 24 to 48 hours.   You have a fever.  MAKE SURE YOU:      Understand these instructions.   Will watch your condition.   Will get help right away if you are not doing well or get worse.  Document Released: 10/06/2005 Document Revised: 09/25/2011 Document Reviewed: 03/05/2011  [employer] Patient Information 2012 [employer].

## 2012-01-20 NOTE — ED Provider Notes (Signed)
History     CSN: [id number]  Arrival date & time 01/20/12  1345   First MD Initiated Contact with Patient 01/20/12 1505      Chief Complaint  Patient presents with  . Migraine  . Nausea  . Emesis    (Consider location/radiation/quality/duration/timing/severity/associated sxs/prior treatment) HPI Comments: Level 5 caveat due to patient condition. Patient reportedly woke up feeling "sick". Patient began having nausea and vomiting that has persisted through most of today. Patient apparently was dialyzed and took approximately 10 pounds off earlier today. He missed his session yesterday, is normally done on Monday Wednesday and Friday. Patient was begun on dialysis a few months ago. He developed worsening kidney disease to 2 history of hypertension and diabetes. Patient denied chest pain, back pain, abdominal pain until he arrived here at the [facility], he started to have some upper abdominal discomfort. Denies fever chills denies diarrhea. Patient is actively retching in the emergency department currently.  Patient is a 46 y.o. male presenting with migraine and vomiting. The history is provided by the patient and the spouse.  Migraine  Emesis     Past Medical History  Diagnosis Date  . Chills     at night - sometimes  . Fatigue     loss of fatigue  . MRSA (methicillin resistant staph aureus) culture positive   . Diabetes mellitus   . Hypertension   . Poor circulation   . Renal disorder     Past Surgical History  Procedure Date  . Av fistula placement 06-18-11    Right brachiocephalic AVF  . Soft tissue mass excision     Right arm, Left leg  for MRSA infection  . Eye surgery     left eye for Laser, diabetic retinopathy    History reviewed. No pertinent family history.  History  Substance Use Topics  . Smoking status: Never Smoker   . Smokeless tobacco: Not on file  . Alcohol Use: No      Review of Systems  Unable to perform ROS: Other  Gastrointestinal:  Positive for vomiting.    Allergies  Percocet  Home Medications   Current Outpatient Rx  Name Route Sig Dispense Refill  . AMLODIPINE BESYLATE 10 MG PO TABS Oral Take 10 mg by mouth at bedtime.     Marland Kitchen CALCIUM ACETATE 667 MG PO CAPS Oral Take 667 mg by mouth 3 (three) times daily with meals.    Marland Kitchen CARVEDILOL 25 MG PO TABS Oral Take 25 mg by mouth 2 (two) times daily with a meal.      . OMEGA-3 FATTY ACIDS 1000 MG PO CAPS Oral Take 1 g by mouth daily.     Marland Kitchen HYDRALAZINE HCL 50 MG PO TABS Oral Take 50 mg by mouth 3 (three) times daily.      Marland Kitchen ONDANSETRON 8 MG PO TBDP Oral Take 1 tablet (8 mg total) by mouth every 12 (twelve) hours as needed for nausea. 20 tablet 0    BP 183/93  Pulse 87  Temp(Src) 98.3 F (36.8 C) (Oral)  Resp 14  SpO2 97%  Physical Exam  Nursing note and vitals reviewed. Constitutional: He appears well-developed and well-nourished. He appears distressed.  HENT:  Head: Normocephalic and atraumatic.  Eyes: Pupils are equal, round, and reactive to light.  Neck: Normal range of motion. Neck supple.  Cardiovascular: Normal rate and regular rhythm.   Pulmonary/Chest: Tachypnea noted. He has no decreased breath sounds.  Abdominal: Soft. There is no rebound and  no guarding.       Actively vomiting, retching.  Neurological: He is alert. No cranial nerve deficit. Coordination normal.  Skin: Skin is warm and dry. No rash noted. He is not diaphoretic.  Psychiatric: He has a normal mood and affect.    ED Course  Procedures (including critical care time)  Labs Reviewed - No data to display No results found.   1. Headache   2. Nausea and vomiting     RA sat is 100% and normal.  EKG at time 1608, shows sinus rhythm at a rate of 84. There is nonspecific flattened T waves noted. Borderline QT prolongation is noted. No change compared to EKG from November 06, 2011   6:07 PM After IV morphine, HA is improved, he reports no further nausea.  abd is soft.  No guard or  rebound.  No CP.  If he can tolerate some PO's, will d/c home.    MDM   After IV Reglan and a small normal saline bolus since he is a dialysis patient, the patient's vomiting has improved dramatically. Unable to obtain more history. The patient has self diagnosed himself with migraines. He reports no photophobia or phonophobia, however he has a throbbing, pounding generalized headache associated with nausea and vomiting in the past. He has never been treated with specific migraine medications. Patient did wake up with a generalized headache that was mild has still gotten worse throughout the day. The patient did have a longer dialysis session than usual today having had 10 pounds removed because he missed his session yesterday. Patient reports the nausea and vomiting did not begin until towards the end of his dialysis session no diarrhea. Patient is afebrile here. No obvious known sick contacts. The patient did not have any chest pain or abdominal pain, his abdomen is soft. My plan is to continue to treat his symptoms, if his headache and nausea are improved and is able to tolerate by mouth's, I feel he is stable for discharge to home. Patient and family are agreeable to this plan        Saddie Benders. Dorna Mai, MD 01/20/12 [id number]

## 2012-01-20 NOTE — ED Notes (Signed)
Pt states nausea has improved but HA continues. Reported to Dr.Ghimm

## 2012-07-26 ENCOUNTER — Other Ambulatory Visit (HOSPITAL_COMMUNITY): Payer: Self-pay | Admitting: Nephrology

## 2012-07-26 DIAGNOSIS — N186 End stage renal disease: Secondary | ICD-10-CM

## 2012-08-03 ENCOUNTER — Ambulatory Visit (HOSPITAL_COMMUNITY): Admission: RE | Admit: 2012-08-03 | Payer: BC Managed Care – PPO | Source: Ambulatory Visit

## 2012-08-06 DIAGNOSIS — T82898D Other specified complication of vascular prosthetic devices, implants and grafts, subsequent encounter: Secondary | ICD-10-CM | POA: Insufficient documentation

## 2012-10-26 ENCOUNTER — Other Ambulatory Visit (HOSPITAL_COMMUNITY): Payer: Self-pay | Admitting: Nephrology

## 2012-10-26 DIAGNOSIS — N186 End stage renal disease: Secondary | ICD-10-CM

## 2012-10-28 ENCOUNTER — Other Ambulatory Visit (HOSPITAL_COMMUNITY): Payer: Self-pay | Admitting: Nephrology

## 2012-10-28 ENCOUNTER — Ambulatory Visit (HOSPITAL_COMMUNITY)
Admission: RE | Admit: 2012-10-28 | Discharge: 2012-10-28 | Disposition: A | Discharge status: Home / Self Care | Payer: BC Managed Care – PPO | Source: Ambulatory Visit | Attending: Nephrology | Admitting: Nephrology

## 2012-10-28 DIAGNOSIS — N186 End stage renal disease: Secondary | ICD-10-CM

## 2012-10-28 DIAGNOSIS — Y832 Surgical operation with anastomosis, bypass or graft as the cause of abnormal reaction of the patient, or of later complication, without mention of misadventure at the time of the procedure: Secondary | ICD-10-CM | POA: Insufficient documentation

## 2012-10-28 DIAGNOSIS — E119 Type 2 diabetes mellitus without complications: Secondary | ICD-10-CM | POA: Insufficient documentation

## 2012-10-28 DIAGNOSIS — T82898A Other specified complication of vascular prosthetic devices, implants and grafts, initial encounter: Secondary | ICD-10-CM | POA: Insufficient documentation

## 2012-10-28 DIAGNOSIS — I871 Compression of vein: Secondary | ICD-10-CM | POA: Insufficient documentation

## 2012-10-28 DIAGNOSIS — I120 Hypertensive chronic kidney disease with stage 5 chronic kidney disease or end stage renal disease: Secondary | ICD-10-CM | POA: Insufficient documentation

## 2012-10-28 DIAGNOSIS — Z992 Dependence on renal dialysis: Secondary | ICD-10-CM | POA: Insufficient documentation

## 2012-10-28 MED ORDER — IOHEXOL 300 MG/ML  SOLN
100.0000 mL | Freq: Once | INTRAMUSCULAR | Status: AC | PRN
  Administered 2012-10-28: 50 mL via INTRAVENOUS

## 2012-10-28 NOTE — H&P (Signed)
[name] is an 47 y.o. male.   Chief Complaint: pain and spasm of dialysis fistula HPI: Patient with history of ESRD , recent pain and spasm of right arm AVF and fistulagram revealing venous stenosis presents today for angioplasty/possible stenting of stenosis or placement of new catheter if needed.  Past Medical History  Diagnosis Date  . Chills     at night - sometimes  . Fatigue     loss of fatigue  . MRSA (methicillin resistant staph aureus) culture positive   . Diabetes mellitus   . Hypertension   . Poor circulation   . Renal disorder     Past Surgical History  Procedure Date  . Av fistula placement 06-18-11    Right brachiocephalic AVF  . Soft tissue mass excision     Right arm, Left leg  for MRSA infection  . Eye surgery     left eye for Laser, diabetic retinopathy    No family history on file. Social History:  reports that he has never smoked. He does not have any smokeless tobacco history on file. He reports that he does not drink alcohol or use illicit drugs.  Allergies:  Allergies  Allergen Reactions  . Percocet (Oxycodone-Acetaminophen) Itching and Other (See Comments)    Legs only. Itching and burning feeling.    Current outpatient prescriptions:amLODipine (NORVASC) 10 MG tablet, Take 10 mg by mouth at bedtime. , Disp: , Rfl: ;  calcium acetate (PHOSLO) 667 MG capsule, Take 667 mg by mouth 3 (three) times daily with meals., Disp: , Rfl: ;  carvedilol (COREG) 25 MG tablet, Take 25 mg by mouth 2 (two) times daily with a meal.  , Disp: , Rfl: ;  fish oil-omega-3 fatty acids 1000 MG capsule, Take 1 g by mouth daily. , Disp: , Rfl:  hydrALAZINE (APRESOLINE) 50 MG tablet, Take 50 mg by mouth 3 (three) times daily.  , Disp: , Rfl:   No results found for this or any previous visit (from the past 48 hour(s)). No results found.  Review of Systems  Constitutional: Negative for fever and chills.  Respiratory: Negative for cough and shortness of breath.     Cardiovascular: Negative for chest pain.  Gastrointestinal: Negative for nausea, vomiting and abdominal pain.  Musculoskeletal: Negative for back pain.  Neurological: Negative for headaches.  There were no vitals filed for this visit.  Physical Exam  Constitutional: He is oriented to person, place, and time. He appears well-developed and well-nourished.  Cardiovascular: Normal rate and regular rhythm.   Respiratory: Effort normal and breath sounds normal.  GI: Soft. Bowel sounds are normal. There is no tenderness.  Musculoskeletal: Normal range of motion. He exhibits no edema.  Neurological: He is alert and oriented to person, place, and time.     Assessment/Plan Pt with ESRD and venous stenosis of right arm AVF. Plan is for angioplasty/possible stenting of stenosis or placement of new catheter if needed. Details/risks of procedure d/w pt with his understanding and consent.  Jun Rightmyer,D KEVIN 10/28/2012, 9:42 AM

## 2012-10-28 NOTE — Procedures (Signed)
Fistulagram, 40mm venous PTA No complication No blood loss. See complete dictation in [facility].

## 2012-11-08 ENCOUNTER — Other Ambulatory Visit: Payer: Self-pay

## 2012-11-08 ENCOUNTER — Encounter: Payer: Self-pay | Admitting: Vascular Surgery

## 2012-11-08 DIAGNOSIS — N186 End stage renal disease: Secondary | ICD-10-CM

## 2012-11-08 DIAGNOSIS — T82598A Other mechanical complication of other cardiac and vascular devices and implants, initial encounter: Secondary | ICD-10-CM

## 2012-11-18 ENCOUNTER — Encounter: Payer: Self-pay | Admitting: Vascular Surgery

## 2012-11-19 ENCOUNTER — Encounter (INDEPENDENT_AMBULATORY_CARE_PROVIDER_SITE_OTHER): Payer: BC Managed Care – PPO | Admitting: *Deleted

## 2012-11-19 ENCOUNTER — Ambulatory Visit (INDEPENDENT_AMBULATORY_CARE_PROVIDER_SITE_OTHER): Payer: BC Managed Care – PPO | Admitting: Vascular Surgery

## 2012-11-19 ENCOUNTER — Encounter: Payer: Self-pay | Admitting: Vascular Surgery

## 2012-11-19 VITALS — BP 115/81 | HR 95 | Ht 70.0 in | Wt 233.6 lb

## 2012-11-19 DIAGNOSIS — N186 End stage renal disease: Secondary | ICD-10-CM

## 2012-11-19 DIAGNOSIS — T82598A Other mechanical complication of other cardiac and vascular devices and implants, initial encounter: Secondary | ICD-10-CM

## 2012-11-19 NOTE — Progress Notes (Signed)
[facility]   Established Dialysis Access  History of Present Illness  [name] is a 47 y.o. (01-19-1966) male who presents for re-evaluation of his R BC AVF.  Most recently, he had a R arm fistulogram with venoplasty of the cephalic vein.  Unfortunately, he continues to have poor flow rates on dialysis.  They have difficulties cannulating his fistula.  Past Medical History  Diagnosis Date  . Chills     at night - sometimes  . Fatigue     loss of fatigue  . MRSA (methicillin resistant staph aureus) culture positive   . Diabetes mellitus   . Hypertension   . Poor circulation   . Renal disorder     Past Surgical History  Procedure Date  . Av fistula placement 06-18-11    Right brachiocephalic AVF  . Soft tissue mass excision     Right arm, Left leg  for MRSA infection  . Eye surgery     left eye for Laser, diabetic retinopathy    History   Social History  . Marital Status: Married    Spouse Name: N/A    Number of Children: N/A  . Years of Education: N/A   Occupational History  . Not on file.   Social History Main Topics  . Smoking status: Never Smoker   . Smokeless tobacco: Not on file  . Alcohol Use: No  . Drug Use: No  . Sexually Active: Not on file   Other Topics Concern  . Not on file   Social History Narrative  . No narrative on file    History reviewed. No pertinent family history.  Current Outpatient Prescriptions on File Prior to Visit  Medication Sig Dispense Refill  . amLODipine (NORVASC) 10 MG tablet Take 10 mg by mouth at bedtime.       Marland Kitchen antiseptic oral rinse (BIOTENE) LIQD 15 mLs by Mouth Rinse route as needed.      Marland Kitchen atorvastatin (LIPITOR) 20 MG tablet Take 20 mg by mouth daily.      . B Complex-C-Folic Acid (NEPHRO-VITE PO) Take 1 tablet by mouth daily.      . calcium acetate (PHOSLO) 667 MG capsule Take 667 mg by mouth 3 (three) times daily with meals.      . carvedilol (COREG) 25 MG tablet Take 25 mg by  mouth 2 (two) times daily with a meal.        . cinacalcet (SENSIPAR) 30 MG tablet Take 30 mg by mouth daily.      . fish oil-omega-3 fatty acids 1000 MG capsule Take 1 g by mouth daily.       Marland Kitchen diltiazem (CARDIZEM CD) 240 MG 24 hr capsule Take 240 mg by mouth daily.      . hydrALAZINE (APRESOLINE) 50 MG tablet Take 50 mg by mouth 3 (three) times daily.        Marland Kitchen lanthanum (FOSRENOL) 1000 MG chewable tablet Chew 1,000 mg by mouth. 2 tablets tid with meals      . methocarbamol (ROBAXIN) 500 MG tablet Take 500 mg by mouth 2 (two) times daily.        Allergies  Allergen Reactions  . Amoxicillin Itching  . Percocet (Oxycodone-Acetaminophen) Itching and Other (See Comments)    Legs only. Itching and burning feeling.    Review of Systems (Positive items checked otherwise negative)  General: [ ]  Weight loss, [ ]  Weight gain, [ ]   Loss of appetite, [ ]  Fever  Neurologic: [ ]  Dizziness, [ ]  Blackouts, [ ]  Headaches, [ ]  Seizure  Ear/Nose/Throat: [ ]  Change in eyesight, [ ]  Change in hearing, [ ]  Nose bleeds, [ ]  Sore throat  Vascular: [ ]  Pain in legs with walking, [ ]  Pain in feet while lying flat, [ ]  Non-healing ulcer, Stroke, [ ]  "Mini stroke", [ ]  Slurred speech, [ ]  Temporary blindness, [ ]  Blood clot in vein, [ ]  Phlebitis  Pulmonary: [ ]  Home oxygen, [ ]  Productive cough, [ ]  Bronchitis, [ ]  Coughing up blood,  [ ]  Asthma, [ ]  Wheezing  Musculoskeletal: [ ]  Arthritis, [ ]  Joint pain, [ ]  Muscle pain  Cardiac: [ ]  Chest pain, [ ]  Chest tightness/pressure, [ ]  Shortness of breath when lying flat, [ ]  Shortness of breath with exertion, [ ]  Palpitations, [ ]  Heart murmur, [ ]  Arrythmia,  [ ]  Atrial fibrillation  Hematologic: [ ]  Bleeding problems, [ ]  Clotting disorder, [ ]  Anemia  Psychiatric:  [ ]  Depression, [ ]  Anxiety, [ ]  Attention deficit disorder  Gastrointestinal:  [ ]  Black stool,[ ]   Blood in stool, [ ]  Peptic ulcer disease, [ ]  Reflux, [ ]  Hiatal hernia, [ ]  Trouble  swallowing, [ ]  Diarrhea, [ ]  Constipation  Urinary:  [x]  Kidney disease, [ ]  Burning with urination, [ ]  Frequent urination, [ ]  Difficulty urinating  Skin: [ ]  Ulcers, [ ]  Rashes   Physical Examination  Filed Vitals:   11/19/12 1526  BP: 115/81  Pulse: 95  Height: 5\' 10"  (1.778 m)  Weight: 233 lb 9.6 oz (105.96 kg)  SpO2: 100%   Body mass index is 33.52 kg/(m^2).  General: A&O x 3, WDWN  Pulmonary: Sym exp, good air movt, CTAB, no rales, rhonchi, & wheezing  Cardiac: RRR, Nl S1, S2, no Murmurs, rubs or gallops  Gastrointestinal: soft, NTND, -G/R, - HSM, - masses, - CVAT B  Musculoskeletal: M/S 5/5 throughout , Extremities without  ischemic changes , prior cannulation scar clustered into two areas  Neurologic: CN 2-12 intact , Pain and light touch intact in extremities , Motor exam as listed above  Medical Decision Making  [name] is a 47 y.o. 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   After reviewing the fistulogram, I recommend replacing the stenotic segment with a short segment prosthetic graft versus vs. Excision of the stenotic segment and proximization of anastomosis via revision.     Depending on length of fistula lost, this second option may be a better option. Risk, benefits, and alternatives to access surgery were discussed.  The patient is aware the risks include but are not limited to: bleeding, infection, steal syndrome, nerve damage, ischemic monomelic neuropathy, failure to mature, need for additional procedures, death and stroke.   The patient agrees to proceed forward with the procedure on 18 FEB 14.  Adele Barthel, MD [facility] [place] Office: [phone] Pager: [phone]  11/19/2012, 4:49 PM

## 2012-11-23 ENCOUNTER — Other Ambulatory Visit: Payer: Self-pay

## 2012-12-03 ENCOUNTER — Encounter (HOSPITAL_COMMUNITY): Payer: Self-pay | Admitting: *Deleted

## 2012-12-06 MED ORDER — VANCOMYCIN HCL IN DEXTROSE 1-5 GM/200ML-% IV SOLN: 1000.0000 mg | INTRAVENOUS | Status: DC

## 2012-12-06 MED ORDER — SODIUM CHLORIDE 0.9 % IV SOLN: INTRAVENOUS | Status: DC

## 2012-12-07 ENCOUNTER — Ambulatory Visit (HOSPITAL_COMMUNITY)
Admission: RE | Admit: 2012-12-07 | Discharge: 2012-12-07 | Disposition: A | Discharge status: Home / Self Care | Payer: BC Managed Care – PPO | Source: Ambulatory Visit | Attending: Vascular Surgery | Admitting: Vascular Surgery

## 2012-12-07 ENCOUNTER — Encounter (HOSPITAL_COMMUNITY): Payer: Self-pay | Admitting: Anesthesiology

## 2012-12-07 ENCOUNTER — Encounter (HOSPITAL_COMMUNITY)
Admission: RE | Disposition: A | Discharge status: Home / Self Care | Payer: Self-pay | Source: Ambulatory Visit | Attending: Vascular Surgery

## 2012-12-07 ENCOUNTER — Ambulatory Visit (HOSPITAL_COMMUNITY): Payer: BC Managed Care – PPO

## 2012-12-07 DIAGNOSIS — I120 Hypertensive chronic kidney disease with stage 5 chronic kidney disease or end stage renal disease: Secondary | ICD-10-CM | POA: Insufficient documentation

## 2012-12-07 DIAGNOSIS — N186 End stage renal disease: Secondary | ICD-10-CM | POA: Insufficient documentation

## 2012-12-07 DIAGNOSIS — Z538 Procedure and treatment not carried out for other reasons: Secondary | ICD-10-CM | POA: Insufficient documentation

## 2012-12-07 DIAGNOSIS — E8779 Other fluid overload: Secondary | ICD-10-CM | POA: Insufficient documentation

## 2012-12-07 DIAGNOSIS — E119 Type 2 diabetes mellitus without complications: Secondary | ICD-10-CM | POA: Insufficient documentation

## 2012-12-07 HISTORY — DX: Other complications of anesthesia, initial encounter: T88.59XA

## 2012-12-07 HISTORY — DX: Dermatitis, unspecified: L30.9

## 2012-12-07 HISTORY — DX: Adverse effect of unspecified anesthetic, initial encounter: T41.45XA

## 2012-12-07 HISTORY — DX: Cardiac murmur, unspecified: R01.1

## 2012-12-07 HISTORY — DX: Nausea with vomiting, unspecified: R11.2

## 2012-12-07 HISTORY — DX: Personal history of other medical treatment: Z92.89

## 2012-12-07 HISTORY — DX: Headache: R51

## 2012-12-07 HISTORY — DX: Other specified postprocedural states: Z98.890

## 2012-12-07 LAB — POTASSIUM: Potassium: 7 mEq/L (ref 3.5–5.1)

## 2012-12-07 LAB — POCT I-STAT 4, (NA,K, GLUC, HGB,HCT)
HCT: 35 % — ABNORMAL LOW (ref 39.0–52.0)
Sodium: 135 mEq/L (ref 135–145)

## 2012-12-07 LAB — SURGICAL PCR SCREEN: Staphylococcus aureus: POSITIVE — AB

## 2012-12-07 SURGERY — CANCELLED PROCEDURE
Site: Arm Lower | Laterality: Right

## 2012-12-07 MED ORDER — DEXTROSE 50 % IV SOLN
25.0000 mL | Freq: Once | INTRAVENOUS | Status: DC
  Filled 2012-12-07: qty 50

## 2012-12-07 MED ORDER — SODIUM POLYSTYRENE SULFONATE 15 GM/60ML PO SUSP
15.0000 g | Freq: Once | ORAL | Status: DC
  Filled 2012-12-07: qty 60

## 2012-12-07 MED ORDER — MUPIROCIN 2 % EX OINT
TOPICAL_OINTMENT | Freq: Two times a day (BID) | CUTANEOUS | Status: DC
  Administered 2012-12-07: 07:00:00 via NASAL
  Filled 2012-12-07 (×2): qty 22

## 2012-12-07 MED ORDER — THROMBIN 20000 UNITS EX SOLR
CUTANEOUS | Status: AC
  Filled 2012-12-07: qty 20000

## 2012-12-07 MED ORDER — SODIUM CHLORIDE 0.9 % IR SOLN: Status: DC | PRN

## 2012-12-07 MED ORDER — DEXTROSE 50 % IV SOLN
INTRAVENOUS | Status: AC
  Filled 2012-12-07: qty 50

## 2012-12-07 MED ORDER — VANCOMYCIN HCL IN DEXTROSE 1-5 GM/200ML-% IV SOLN
INTRAVENOUS | Status: AC
  Filled 2012-12-07: qty 200

## 2012-12-07 MED ORDER — INSULIN REGULAR HUMAN 100 UNIT/ML IJ SOLN
10.0000 [IU] | Freq: Once | INTRAMUSCULAR | Status: DC
  Filled 2012-12-07: qty 0.1

## 2012-12-07 MED ORDER — LIDOCAINE-EPINEPHRINE (PF) 1 %-1:200000 IJ SOLN
INTRAMUSCULAR | Status: AC
  Filled 2012-12-07: qty 10

## 2012-12-07 MED ORDER — BUPIVACAINE HCL (PF) 0.5 % IJ SOLN
INTRAMUSCULAR | Status: AC
  Filled 2012-12-07: qty 30

## 2012-12-07 SURGICAL SUPPLY — 31 items
CANISTER SUCTION 2500CC (MISCELLANEOUS) ×2 IMPLANT
CLIP TI MEDIUM 6 (CLIP) ×2 IMPLANT
CLIP TI WIDE RED SMALL 6 (CLIP) ×2 IMPLANT
CLOTH BEACON ORANGE TIMEOUT ST (SAFETY) ×2 IMPLANT
COVER PROBE W GEL 5X96 (DRAPES) IMPLANT
COVER SURGICAL LIGHT HANDLE (MISCELLANEOUS) ×2 IMPLANT
DECANTER SPIKE VIAL GLASS SM (MISCELLANEOUS) ×2 IMPLANT
DERMABOND ADVANCED (GAUZE/BANDAGES/DRESSINGS) ×1
DERMABOND ADVANCED .7 DNX12 (GAUZE/BANDAGES/DRESSINGS) ×1 IMPLANT
DRAIN PENROSE 1/2X12 LTX STRL (WOUND CARE) IMPLANT
ELECT REM PT RETURN 9FT ADLT (ELECTROSURGICAL) ×2
ELECTRODE REM PT RTRN 9FT ADLT (ELECTROSURGICAL) ×1 IMPLANT
GLOVE BIO SURGEON STRL SZ7 (GLOVE) ×2 IMPLANT
GLOVE BIOGEL PI IND STRL 7.5 (GLOVE) ×1 IMPLANT
GLOVE BIOGEL PI INDICATOR 7.5 (GLOVE) ×1
GOWN STRL NON-REIN LRG LVL3 (GOWN DISPOSABLE) ×4 IMPLANT
KIT BASIN OR (CUSTOM PROCEDURE TRAY) ×2 IMPLANT
KIT ROOM TURNOVER OR (KITS) ×2 IMPLANT
NS IRRIG 1000ML POUR BTL (IV SOLUTION) ×2 IMPLANT
PACK CV ACCESS (CUSTOM PROCEDURE TRAY) ×2 IMPLANT
PAD ARMBOARD 7.5X6 YLW CONV (MISCELLANEOUS) ×4 IMPLANT
SPONGE SURGIFOAM ABS GEL 100 (HEMOSTASIS) IMPLANT
SUT MNCRL AB 4-0 PS2 18 (SUTURE) ×2 IMPLANT
SUT PROLENE 6 0 BV (SUTURE) IMPLANT
SUT PROLENE 7 0 BV 1 (SUTURE) ×2 IMPLANT
SUT VIC AB 3-0 SH 27 (SUTURE) ×1
SUT VIC AB 3-0 SH 27X BRD (SUTURE) ×1 IMPLANT
TOWEL OR 17X24 6PK STRL BLUE (TOWEL DISPOSABLE) ×2 IMPLANT
TOWEL OR 17X26 10 PK STRL BLUE (TOWEL DISPOSABLE) ×2 IMPLANT
UNDERPAD 30X30 INCONTINENT (UNDERPADS AND DIAPERS) ×2 IMPLANT
WATER STERILE IRR 1000ML POUR (IV SOLUTION) ×2 IMPLANT

## 2012-12-07 NOTE — Progress Notes (Addendum)
[facility]   Laboratory CBC    Component Value Date/Time   WBC 9.0 06/19/2011 0809   HGB 11.9* 12/07/2012 0641   HCT 35.0* 12/07/2012 0641   PLT 400 06/19/2011 0809    BMET    Component Value Date/Time   NA 135 12/07/2012 0641   K 6.9* 12/07/2012 0641   CL 103 11/06/2011 0618   CO2 28 06/19/2011 0810   GLUCOSE 110* 12/07/2012 0641   BUN 43* 11/06/2011 0618   CREATININE 7.50* 11/06/2011 0618   CALCIUM 9.0 06/19/2011 0810   CALCIUM 7.9* 08/31/2009 1524   GFRNONAA 9* 06/19/2011 0810   GFRAA 11* 06/19/2011 0810   - Will give pt 1/2 amp D50W and 10 U regular insulin - Kayexelate 15 g PO once - I have discussed with his dialysis center getting dialyzed ASAP - we will d/c pt to his dialysis center on this is setup - case will be rescheduled for the coming week  Adele Barthel, MD [facility] [place] Office: [phone] Pager: [phone]  12/07/2012, 7:40 AM

## 2012-12-07 NOTE — Progress Notes (Signed)
POTASSIUM WAS SENT DOWN TO BE VERIFIED AND WAS 7.0 (PER SHORTSTAY JAY AND SHAWN IN MAIN LAB).  RESULT WAS CALLED TO BOTH  DR. Linna Caprice AND DR. CHEN.  PATIENT WAS GIVEN IV 10 UNITS NOVOLOG INSULIN, 1/2 AMP D 50 IV, AND PO 15 GM KAEXALATE.  PER DR. CHEN PATIENT DID NOT NEED TO HAVE ANY LABS  AND PATIENT NEEDS TO GO TO HEMODIALYSIS FOR 1115 APPT.  PATIENT WAS INSTRUCTED TO GO ON STRAIGHT TO DIALYSIS FROM [facility].

## 2012-12-07 NOTE — Interval H&P Note (Signed)
History and Physical Interval Note:  12/07/2012 7:26 AM  [name]  has presented today for surgery, with the diagnosis of End Stage Renal Disease  The various methods of treatment have been discussed with the patient and family. After consideration of risks, benefits and other options for treatment, the patient has consented to  Procedure(s) with comments: REVISON OF ARTERIOVENOUS FISTULA (Right) - with Interposition as a surgical intervention .  The patient's history has been reviewed, patient examined, no change in status, stable for surgery.  I have reviewed the patient's chart and labs.  Questions were answered to the patient's satisfaction.     [name] LIANG-YU

## 2012-12-07 NOTE — Anesthesia Preprocedure Evaluation (Deleted)
Anesthesia Evaluation  Patient identified by MRN, date of birth, ID band Patient awake    Reviewed: Allergy & Precautions, H&P , NPO status , Patient's Chart, lab work & pertinent test results  History of Anesthesia Complications (+) PONV  Airway       Dental   Pulmonary neg pulmonary ROS,          Cardiovascular hypertension, Pt. on medications + Peripheral Vascular Disease     Neuro/Psych  Headaches, negative psych ROS   GI/Hepatic   Endo/Other  diabetes  Renal/GU ESRFRenal disease  negative genitourinary   Musculoskeletal negative musculoskeletal ROS (+)   Abdominal   Peds  Hematology  (+) Blood dyscrasia, anemia ,   Anesthesia Other Findings   Reproductive/Obstetrics                         Anesthesia Physical Anesthesia Plan Anesthesia Quick Evaluation

## 2012-12-07 NOTE — H&P (View-Only) (Signed)
[facility]  Established Dialysis Access  History of Present Illness  [name] is a 47 y.o. (09/05/1966) male who presents for re-evaluation of his R BC AVF.  Most recently, he had a R arm fistulogram with venoplasty of the cephalic vein.  Unfortunately, he continues to have poor flow rates on dialysis.  They have difficulties cannulating his fistula.  Past Medical History  Diagnosis Date  . Chills     at night - sometimes  . Fatigue     loss of fatigue  . MRSA (methicillin resistant staph aureus) culture positive   . Diabetes mellitus   . Hypertension   . Poor circulation   . Renal disorder     Past Surgical History  Procedure Date  . Av fistula placement 06-18-11    Right brachiocephalic AVF  . Soft tissue mass excision     Right arm, Left leg  for MRSA infection  . Eye surgery     left eye for Laser, diabetic retinopathy    History   Social History  . Marital Status: Married    Spouse Name: N/A    Number of Children: N/A  . Years of Education: N/A   Occupational History  . Not on file.   Social History Main Topics  . Smoking status: Never Smoker   . Smokeless tobacco: Not on file  . Alcohol Use: No  . Drug Use: No  . Sexually Active: Not on file   Other Topics Concern  . Not on file   Social History Narrative  . No narrative on file    History reviewed. No pertinent family history.  Current Outpatient Prescriptions on File Prior to Visit  Medication Sig Dispense Refill  . amLODipine (NORVASC) 10 MG tablet Take 10 mg by mouth at bedtime.       Marland Kitchen antiseptic oral rinse (BIOTENE) LIQD 15 mLs by Mouth Rinse route as needed.      Marland Kitchen atorvastatin (LIPITOR) 20 MG tablet Take 20 mg by mouth daily.      . B Complex-C-Folic Acid (NEPHRO-VITE PO) Take 1 tablet by mouth daily.      . calcium acetate (PHOSLO) 667 MG capsule Take 667 mg by mouth 3 (three) times daily with meals.      . carvedilol (COREG) 25 MG tablet Take 25 mg by  mouth 2 (two) times daily with a meal.        . cinacalcet (SENSIPAR) 30 MG tablet Take 30 mg by mouth daily.      . fish oil-omega-3 fatty acids 1000 MG capsule Take 1 g by mouth daily.       Marland Kitchen diltiazem (CARDIZEM CD) 240 MG 24 hr capsule Take 240 mg by mouth daily.      . hydrALAZINE (APRESOLINE) 50 MG tablet Take 50 mg by mouth 3 (three) times daily.        Marland Kitchen lanthanum (FOSRENOL) 1000 MG chewable tablet Chew 1,000 mg by mouth. 2 tablets tid with meals      . methocarbamol (ROBAXIN) 500 MG tablet Take 500 mg by mouth 2 (two) times daily.        Allergies  Allergen Reactions  . Amoxicillin Itching  . Percocet (Oxycodone-Acetaminophen) Itching and Other (See Comments)    Legs only. Itching and burning feeling.    Review of Systems (Positive items checked otherwise negative)  General: [ ]  Weight loss, [ ]  Weight gain, [ ]   Loss of appetite, [ ]  Fever  Neurologic: [ ]  Dizziness, [ ]  Blackouts, [ ]  Headaches, [ ]  Seizure  Ear/Nose/Throat: [ ]  Change in eyesight, [ ]  Change in hearing, [ ]  Nose bleeds, [ ]  Sore throat  Vascular: [ ]  Pain in legs with walking, [ ]  Pain in feet while lying flat, [ ]  Non-healing ulcer, Stroke, [ ]  "Mini stroke", [ ]  Slurred speech, [ ]  Temporary blindness, [ ]  Blood clot in vein, [ ]  Phlebitis  Pulmonary: [ ]  Home oxygen, [ ]  Productive cough, [ ]  Bronchitis, [ ]  Coughing up blood,  [ ]  Asthma, [ ]  Wheezing  Musculoskeletal: [ ]  Arthritis, [ ]  Joint pain, [ ]  Muscle pain  Cardiac: [ ]  Chest pain, [ ]  Chest tightness/pressure, [ ]  Shortness of breath when lying flat, [ ]  Shortness of breath with exertion, [ ]  Palpitations, [ ]  Heart murmur, [ ]  Arrythmia,  [ ]  Atrial fibrillation  Hematologic: [ ]  Bleeding problems, [ ]  Clotting disorder, [ ]  Anemia  Psychiatric:  [ ]  Depression, [ ]  Anxiety, [ ]  Attention deficit disorder  Gastrointestinal:  [ ]  Black stool,[ ]   Blood in stool, [ ]  Peptic ulcer disease, [ ]  Reflux, [ ]  Hiatal hernia, [ ]  Trouble  swallowing, [ ]  Diarrhea, [ ]  Constipation  Urinary:  [x]  Kidney disease, [ ]  Burning with urination, [ ]  Frequent urination, [ ]  Difficulty urinating  Skin: [ ]  Ulcers, [ ]  Rashes   Physical Examination  Filed Vitals:   11/19/12 1526  BP: 115/81  Pulse: 95  Height: 5\' 10"  (1.778 m)  Weight: 233 lb 9.6 oz (105.96 kg)  SpO2: 100%   Body mass index is 33.52 kg/(m^2).  General: A&O x 3, WDWN  Pulmonary: Sym exp, good air movt, CTAB, no rales, rhonchi, & wheezing  Cardiac: RRR, Nl S1, S2, no Murmurs, rubs or gallops  Gastrointestinal: soft, NTND, -G/R, - HSM, - masses, - CVAT B  Musculoskeletal: M/S 5/5 throughout , Extremities without  ischemic changes , prior cannulation scar clustered into two areas  Neurologic: CN 2-12 intact , Pain and light touch intact in extremities , Motor exam as listed above  Medical Decision Making  [name] is a 47 y.o. 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   After reviewing the fistulogram, I recommend replacing the stenotic segment with a short segment prosthetic graft versus vs. Excision of the stenotic segment and proximization of anastomosis via revision.     Depending on length of fistula lost, this second option may be a better option. Risk, benefits, and alternatives to access surgery were discussed.  The patient is aware the risks include but are not limited to: bleeding, infection, steal syndrome, nerve damage, ischemic monomelic neuropathy, failure to mature, need for additional procedures, death and stroke.   The patient agrees to proceed forward with the procedure on 18 FEB 14.  Adele Barthel, MD [facility] Office: [phone] Pager: [phone]  11/19/2012, 4:49 PM

## 2012-12-07 NOTE — Progress Notes (Signed)
PHARMACY IS UPDATING LIST OF MEDS.  PATIENT STATES HE IS ONLY TAKING AMLODIPINE, CALCIUM ACET, AND LIPITOR.

## 2012-12-07 NOTE — Progress Notes (Addendum)
CALLED AND LEFT MESSAGE FOR MR. [name] TO CALL RE + NASAL SWAB.  Mr. Plano returned call and will [pick up mupirocin and complete doses, inst sent with medicine.

## 2012-12-08 ENCOUNTER — Other Ambulatory Visit: Payer: Self-pay | Admitting: *Deleted

## 2012-12-13 ENCOUNTER — Encounter (HOSPITAL_COMMUNITY): Payer: Self-pay

## 2012-12-13 MED ORDER — VANCOMYCIN HCL 10 G IV SOLR
1500.0000 mg | INTRAVENOUS | Status: AC
  Administered 2012-12-14: 1500 mg via INTRAVENOUS
  Filled 2012-12-13: qty 1500

## 2012-12-14 ENCOUNTER — Encounter (HOSPITAL_COMMUNITY): Payer: Self-pay | Admitting: Anesthesiology

## 2012-12-14 ENCOUNTER — Telehealth: Payer: Self-pay | Admitting: Vascular Surgery

## 2012-12-14 ENCOUNTER — Ambulatory Visit (HOSPITAL_COMMUNITY): Payer: BC Managed Care – PPO

## 2012-12-14 ENCOUNTER — Encounter (HOSPITAL_COMMUNITY): Payer: Self-pay | Admitting: *Deleted

## 2012-12-14 ENCOUNTER — Encounter (HOSPITAL_COMMUNITY)
Admission: RE | Disposition: A | Discharge status: Home / Self Care | Payer: Self-pay | Source: Ambulatory Visit | Attending: Vascular Surgery

## 2012-12-14 ENCOUNTER — Ambulatory Visit (HOSPITAL_COMMUNITY)
Admission: RE | Admit: 2012-12-14 | Discharge: 2012-12-14 | Disposition: A | Discharge status: Home / Self Care | Payer: BC Managed Care – PPO | Source: Ambulatory Visit | Attending: Vascular Surgery | Admitting: Vascular Surgery

## 2012-12-14 ENCOUNTER — Ambulatory Visit (HOSPITAL_COMMUNITY): Payer: BC Managed Care – PPO | Admitting: Anesthesiology

## 2012-12-14 DIAGNOSIS — Y832 Surgical operation with anastomosis, bypass or graft as the cause of abnormal reaction of the patient, or of later complication, without mention of misadventure at the time of the procedure: Secondary | ICD-10-CM | POA: Insufficient documentation

## 2012-12-14 DIAGNOSIS — M7989 Other specified soft tissue disorders: Secondary | ICD-10-CM | POA: Insufficient documentation

## 2012-12-14 DIAGNOSIS — IMO0002 Reserved for concepts with insufficient information to code with codable children: Secondary | ICD-10-CM

## 2012-12-14 DIAGNOSIS — I120 Hypertensive chronic kidney disease with stage 5 chronic kidney disease or end stage renal disease: Secondary | ICD-10-CM | POA: Insufficient documentation

## 2012-12-14 DIAGNOSIS — Z992 Dependence on renal dialysis: Secondary | ICD-10-CM | POA: Insufficient documentation

## 2012-12-14 DIAGNOSIS — T82898A Other specified complication of vascular prosthetic devices, implants and grafts, initial encounter: Secondary | ICD-10-CM

## 2012-12-14 DIAGNOSIS — N186 End stage renal disease: Secondary | ICD-10-CM

## 2012-12-14 DIAGNOSIS — E119 Type 2 diabetes mellitus without complications: Secondary | ICD-10-CM | POA: Insufficient documentation

## 2012-12-14 DIAGNOSIS — I871 Compression of vein: Secondary | ICD-10-CM | POA: Insufficient documentation

## 2012-12-14 HISTORY — PX: HEMATOMA EVACUATION: SHX5118

## 2012-12-14 HISTORY — PX: REVISON OF ARTERIOVENOUS FISTULA: SHX6074

## 2012-12-14 HISTORY — PX: INSERTION OF DIALYSIS CATHETER: SHX1324

## 2012-12-14 LAB — GLUCOSE, CAPILLARY: Glucose-Capillary: 164 mg/dL — ABNORMAL HIGH (ref 70–99)

## 2012-12-14 LAB — POCT I-STAT 4, (NA,K, GLUC, HGB,HCT)
Glucose, Bld: 227 mg/dL — ABNORMAL HIGH (ref 70–99)
HCT: 33 % — ABNORMAL LOW (ref 39.0–52.0)
Hemoglobin: 11.2 g/dL — ABNORMAL LOW (ref 13.0–17.0)

## 2012-12-14 SURGERY — REVISON OF ARTERIOVENOUS FISTULA
Anesthesia: General | Site: Arm Lower | Laterality: Right | Wound class: Clean

## 2012-12-14 SURGERY — EVACUATION HEMATOMA
Anesthesia: General | Site: Arm Upper | Laterality: Right | Wound class: Clean

## 2012-12-14 MED ORDER — HEPARIN SODIUM (PORCINE) 1000 UNIT/ML IJ SOLN
INTRAMUSCULAR | Status: DC | PRN
  Administered 2012-12-14: 10 mL

## 2012-12-14 MED ORDER — SUCCINYLCHOLINE CHLORIDE 20 MG/ML IJ SOLN
INTRAMUSCULAR | Status: DC | PRN
  Administered 2012-12-14: 100 mg via INTRAVENOUS

## 2012-12-14 MED ORDER — THROMBIN 20000 UNITS EX SOLR
CUTANEOUS | Status: AC
  Filled 2012-12-14: qty 20000

## 2012-12-14 MED ORDER — ARTIFICIAL TEARS OP OINT
TOPICAL_OINTMENT | OPHTHALMIC | Status: DC | PRN
  Administered 2012-12-14: 1 via OPHTHALMIC

## 2012-12-14 MED ORDER — DIPHENHYDRAMINE HCL 50 MG/ML IJ SOLN
12.5000 mg | Freq: Once | INTRAMUSCULAR | Status: AC
  Administered 2012-12-14: 12.5 mg via INTRAVENOUS

## 2012-12-14 MED ORDER — ONDANSETRON HCL 4 MG/2ML IJ SOLN
4.0000 mg | Freq: Four times a day (QID) | INTRAMUSCULAR | Status: AC | PRN
  Administered 2012-12-14: 4 mg via INTRAVENOUS

## 2012-12-14 MED ORDER — MIDAZOLAM HCL 5 MG/5ML IJ SOLN
INTRAMUSCULAR | Status: DC | PRN
  Administered 2012-12-14: 1 mg via INTRAVENOUS

## 2012-12-14 MED ORDER — LIDOCAINE HCL 4 % MT SOLN
OROMUCOSAL | Status: DC | PRN
  Administered 2012-12-14: 4 mL via TOPICAL

## 2012-12-14 MED ORDER — ONDANSETRON HCL 4 MG/2ML IJ SOLN
INTRAMUSCULAR | Status: AC
  Filled 2012-12-14: qty 2

## 2012-12-14 MED ORDER — THROMBIN 20000 UNITS EX KIT
PACK | CUTANEOUS | Status: DC | PRN
  Administered 2012-12-14: 11:00:00 via TOPICAL

## 2012-12-14 MED ORDER — FENTANYL CITRATE 0.05 MG/ML IJ SOLN: 25.0000 ug | INTRAMUSCULAR | Status: DC | PRN

## 2012-12-14 MED ORDER — HYDROCODONE-ACETAMINOPHEN 5-325 MG PO TABS: 1.0000 | ORAL_TABLET | Freq: Four times a day (QID) | ORAL | Status: DC | PRN

## 2012-12-14 MED ORDER — PROTAMINE SULFATE 10 MG/ML IV SOLN
INTRAVENOUS | Status: DC | PRN
  Administered 2012-12-14: 15 mg via INTRAVENOUS

## 2012-12-14 MED ORDER — PROMETHAZINE HCL 25 MG/ML IJ SOLN
12.5000 mg | Freq: Once | INTRAMUSCULAR | Status: AC
  Administered 2012-12-14: 12.5 mg via INTRAVENOUS

## 2012-12-14 MED ORDER — SODIUM CHLORIDE 0.9 % IR SOLN
Status: DC | PRN
  Administered 2012-12-14: 1000 mL

## 2012-12-14 MED ORDER — LIDOCAINE-EPINEPHRINE (PF) 1 %-1:200000 IJ SOLN
INTRAMUSCULAR | Status: DC | PRN
  Administered 2012-12-14: 30 mL via INTRADERMAL

## 2012-12-14 MED ORDER — HEPARIN SODIUM (PORCINE) 1000 UNIT/ML IJ SOLN
INTRAMUSCULAR | Status: DC | PRN
  Administered 2012-12-14: 3000 [IU] via INTRAVENOUS

## 2012-12-14 MED ORDER — SODIUM CHLORIDE 0.9 % IV SOLN
INTRAVENOUS | Status: DC | PRN
  Administered 2012-12-14 (×2): via INTRAVENOUS

## 2012-12-14 MED ORDER — BUPIVACAINE HCL (PF) 0.5 % IJ SOLN
INTRAMUSCULAR | Status: AC
  Filled 2012-12-14: qty 30

## 2012-12-14 MED ORDER — ONDANSETRON HCL 4 MG/2ML IJ SOLN
INTRAMUSCULAR | Status: DC | PRN
  Administered 2012-12-14: 4 mg via INTRAVENOUS

## 2012-12-14 MED ORDER — FENTANYL CITRATE 0.05 MG/ML IJ SOLN
INTRAMUSCULAR | Status: DC | PRN
  Administered 2012-12-14: 25 ug via INTRAVENOUS
  Administered 2012-12-14: 50 ug via INTRAVENOUS
  Administered 2012-12-14: 25 ug via INTRAVENOUS
  Administered 2012-12-14 (×2): 50 ug via INTRAVENOUS
  Administered 2012-12-14 (×2): 25 ug via INTRAVENOUS

## 2012-12-14 MED ORDER — PROPOFOL 10 MG/ML IV BOLUS
INTRAVENOUS | Status: DC | PRN
  Administered 2012-12-14: 150 mg via INTRAVENOUS
  Administered 2012-12-14 (×3): 50 mg via INTRAVENOUS
  Administered 2012-12-14: 200 mg via INTRAVENOUS

## 2012-12-14 MED ORDER — ONDANSETRON HCL 4 MG/2ML IJ SOLN: 4.0000 mg | Freq: Four times a day (QID) | INTRAMUSCULAR | Status: DC | PRN

## 2012-12-14 MED ORDER — FENTANYL CITRATE 0.05 MG/ML IJ SOLN
INTRAMUSCULAR | Status: DC | PRN
  Administered 2012-12-14 (×2): 50 ug via INTRAVENOUS

## 2012-12-14 MED ORDER — LIDOCAINE-EPINEPHRINE (PF) 1 %-1:200000 IJ SOLN
INTRAMUSCULAR | Status: AC
  Filled 2012-12-14: qty 10

## 2012-12-14 MED ORDER — LIDOCAINE HCL (CARDIAC) 20 MG/ML IV SOLN
INTRAVENOUS | Status: DC | PRN
  Administered 2012-12-14: 80 mg via INTRAVENOUS
  Administered 2012-12-14: 10 mg via INTRAVENOUS

## 2012-12-14 MED ORDER — SODIUM CHLORIDE 0.9 % IV SOLN
INTRAVENOUS | Status: DC
  Administered 2012-12-14 (×3): via INTRAVENOUS

## 2012-12-14 MED ORDER — PROPOFOL 10 MG/ML IV BOLUS
INTRAVENOUS | Status: DC | PRN
  Administered 2012-12-14: 200 mg via INTRAVENOUS
  Administered 2012-12-14: 20 mg via INTRAVENOUS

## 2012-12-14 MED ORDER — LIDOCAINE HCL (CARDIAC) 20 MG/ML IV SOLN
INTRAVENOUS | Status: DC | PRN
  Administered 2012-12-14: 60 mg via INTRAVENOUS

## 2012-12-14 MED ORDER — DIPHENHYDRAMINE HCL 50 MG/ML IJ SOLN
INTRAMUSCULAR | Status: AC
  Filled 2012-12-14: qty 1

## 2012-12-14 MED ORDER — SODIUM CHLORIDE 0.9 % IR SOLN
Status: DC | PRN
  Administered 2012-12-14: 09:00:00

## 2012-12-14 MED ORDER — HEPARIN SODIUM (PORCINE) 1000 UNIT/ML IJ SOLN
INTRAMUSCULAR | Status: AC
  Filled 2012-12-14: qty 1

## 2012-12-14 MED ORDER — PROMETHAZINE HCL 25 MG/ML IJ SOLN
INTRAMUSCULAR | Status: AC
  Filled 2012-12-14: qty 1

## 2012-12-14 MED ORDER — THROMBIN 20000 UNITS EX SOLR
CUTANEOUS | Status: DC | PRN
  Administered 2012-12-14: 15:00:00 via TOPICAL

## 2012-12-14 SURGICAL SUPPLY — 53 items
CANISTER SUCTION 2500CC (MISCELLANEOUS) ×3 IMPLANT
CATH CANNON HEMO 15FR 23CM (HEMODIALYSIS SUPPLIES) IMPLANT
CATH CANNON HEMO 15FR 32CM (HEMODIALYSIS SUPPLIES) ×3 IMPLANT
CLIP TI MEDIUM 6 (CLIP) ×3 IMPLANT
CLIP TI WIDE RED SMALL 6 (CLIP) ×3 IMPLANT
CLOTH BEACON ORANGE TIMEOUT ST (SAFETY) ×3 IMPLANT
COVER PROBE W GEL 5X96 (DRAPES) ×6 IMPLANT
COVER SURGICAL LIGHT HANDLE (MISCELLANEOUS) ×3 IMPLANT
DECANTER SPIKE VIAL GLASS SM (MISCELLANEOUS) IMPLANT
DERMABOND ADHESIVE PROPEN (GAUZE/BANDAGES/DRESSINGS) ×1
DERMABOND ADVANCED (GAUZE/BANDAGES/DRESSINGS) ×1
DERMABOND ADVANCED .7 DNX12 (GAUZE/BANDAGES/DRESSINGS) ×2 IMPLANT
DERMABOND ADVANCED .7 DNX6 (GAUZE/BANDAGES/DRESSINGS) ×2 IMPLANT
DRAIN PENROSE 1/2X12 LTX STRL (WOUND CARE) IMPLANT
DRAPE C-ARM 42X72 X-RAY (DRAPES) ×3 IMPLANT
DRAPE CHEST BREAST 15X10 FENES (DRAPES) ×3 IMPLANT
ELECT REM PT RETURN 9FT ADLT (ELECTROSURGICAL) ×3
ELECTRODE REM PT RTRN 9FT ADLT (ELECTROSURGICAL) ×2 IMPLANT
GAUZE SPONGE 4X4 16PLY XRAY LF (GAUZE/BANDAGES/DRESSINGS) ×3 IMPLANT
GLOVE BIO SURGEON STRL SZ7 (GLOVE) ×3 IMPLANT
GLOVE BIOGEL PI IND STRL 6.5 (GLOVE) ×8 IMPLANT
GLOVE BIOGEL PI IND STRL 7.5 (GLOVE) ×4 IMPLANT
GLOVE BIOGEL PI INDICATOR 6.5 (GLOVE) ×4
GLOVE BIOGEL PI INDICATOR 7.5 (GLOVE) ×2
GLOVE ECLIPSE 6.5 STRL STRAW (GLOVE) ×3 IMPLANT
GLOVE SS BIOGEL STRL SZ 6 (GLOVE) ×4 IMPLANT
GLOVE SS BIOGEL STRL SZ 7 (GLOVE) ×2 IMPLANT
GLOVE SUPERSENSE BIOGEL SZ 6 (GLOVE) ×2
GLOVE SUPERSENSE BIOGEL SZ 7 (GLOVE) ×1
GOWN STRL NON-REIN LRG LVL3 (GOWN DISPOSABLE) ×6 IMPLANT
GRAFT GORETEX 6X10 (Vascular Products) ×3 IMPLANT
KIT BASIN OR (CUSTOM PROCEDURE TRAY) ×3 IMPLANT
KIT ROOM TURNOVER OR (KITS) ×3 IMPLANT
NEEDLE 18GX1X1/2 (RX/OR ONLY) (NEEDLE) ×3 IMPLANT
NEEDLE 22X1 1/2 (OR ONLY) (NEEDLE) ×3 IMPLANT
NS IRRIG 1000ML POUR BTL (IV SOLUTION) ×3 IMPLANT
PACK CV ACCESS (CUSTOM PROCEDURE TRAY) ×3 IMPLANT
PAD ARMBOARD 7.5X6 YLW CONV (MISCELLANEOUS) ×6 IMPLANT
SLEEVE SURGEON STRL (DRAPES) ×3 IMPLANT
SPONGE SURGIFOAM ABS GEL 100 (HEMOSTASIS) ×3 IMPLANT
SUT ETHILON 3 0 PS 1 (SUTURE) ×3 IMPLANT
SUT MNCRL AB 4-0 PS2 18 (SUTURE) ×6 IMPLANT
SUT PROLENE 6 0 BV (SUTURE) ×9 IMPLANT
SUT PROLENE 7 0 BV 1 (SUTURE) ×3 IMPLANT
SUT VIC AB 3-0 SH 27 (SUTURE) ×1
SUT VIC AB 3-0 SH 27X BRD (SUTURE) ×2 IMPLANT
SYR 3ML LL SCALE MARK (SYRINGE) ×3 IMPLANT
SYR 5ML LL (SYRINGE) ×3 IMPLANT
SYRINGE 10CC LL (SYRINGE) ×3 IMPLANT
TOWEL OR 17X24 6PK STRL BLUE (TOWEL DISPOSABLE) ×6 IMPLANT
TOWEL OR 17X26 10 PK STRL BLUE (TOWEL DISPOSABLE) ×3 IMPLANT
UNDERPAD 30X30 INCONTINENT (UNDERPADS AND DIAPERS) ×3 IMPLANT
WATER STERILE IRR 1000ML POUR (IV SOLUTION) ×3 IMPLANT

## 2012-12-14 SURGICAL SUPPLY — 49 items
BANDAGE ELASTIC 4 VELCRO ST LF (GAUZE/BANDAGES/DRESSINGS) ×2 IMPLANT
BANDAGE ESMARK 6X9 LF (GAUZE/BANDAGES/DRESSINGS) IMPLANT
BANDAGE GAUZE ELAST BULKY 4 IN (GAUZE/BANDAGES/DRESSINGS) ×2 IMPLANT
BNDG ESMARK 6X9 LF (GAUZE/BANDAGES/DRESSINGS)
CANISTER SUCTION 2500CC (MISCELLANEOUS) ×2 IMPLANT
CLOTH BEACON ORANGE TIMEOUT ST (SAFETY) ×2 IMPLANT
COVER SURGICAL LIGHT HANDLE (MISCELLANEOUS) ×2 IMPLANT
CUFF TOURNIQUET SINGLE 18IN (TOURNIQUET CUFF) IMPLANT
CUFF TOURNIQUET SINGLE 24IN (TOURNIQUET CUFF) IMPLANT
CUFF TOURNIQUET SINGLE 34IN LL (TOURNIQUET CUFF) IMPLANT
CUFF TOURNIQUET SINGLE 44IN (TOURNIQUET CUFF) IMPLANT
DERMABOND ADVANCED (GAUZE/BANDAGES/DRESSINGS) ×1
DERMABOND ADVANCED .7 DNX12 (GAUZE/BANDAGES/DRESSINGS) ×1 IMPLANT
DRAIN CHANNEL 15F RND FF W/TCR (WOUND CARE) IMPLANT
DRSG COVADERM 4X10 (GAUZE/BANDAGES/DRESSINGS) IMPLANT
DRSG COVADERM 4X8 (GAUZE/BANDAGES/DRESSINGS) IMPLANT
ELECT REM PT RETURN 9FT ADLT (ELECTROSURGICAL) ×2
ELECTRODE REM PT RTRN 9FT ADLT (ELECTROSURGICAL) ×1 IMPLANT
EVACUATOR SILICONE 100CC (DRAIN) IMPLANT
GLOVE BIO SURGEON STRL SZ 6.5 (GLOVE) ×4 IMPLANT
GLOVE BIO SURGEON STRL SZ7 (GLOVE) ×2 IMPLANT
GLOVE BIOGEL PI IND STRL 6.5 (GLOVE) ×6 IMPLANT
GLOVE BIOGEL PI IND STRL 7.5 (GLOVE) ×1 IMPLANT
GLOVE BIOGEL PI INDICATOR 6.5 (GLOVE) ×6
GLOVE BIOGEL PI INDICATOR 7.5 (GLOVE) ×1
GLOVE ECLIPSE 6.5 STRL STRAW (GLOVE) ×2 IMPLANT
GOWN STRL NON-REIN LRG LVL3 (GOWN DISPOSABLE) ×8 IMPLANT
KIT BASIN OR (CUSTOM PROCEDURE TRAY) ×2 IMPLANT
KIT ROOM TURNOVER OR (KITS) ×2 IMPLANT
NS IRRIG 1000ML POUR BTL (IV SOLUTION) ×2 IMPLANT
PACK CV ACCESS (CUSTOM PROCEDURE TRAY) ×2 IMPLANT
PACK PERIPHERAL VASCULAR (CUSTOM PROCEDURE TRAY) IMPLANT
PAD ARMBOARD 7.5X6 YLW CONV (MISCELLANEOUS) ×4 IMPLANT
PADDING CAST COTTON 6X4 STRL (CAST SUPPLIES) IMPLANT
SPONGE SURGIFOAM ABS GEL 100 (HEMOSTASIS) ×2 IMPLANT
STAPLER VISISTAT 35W (STAPLE) IMPLANT
SUT MNCRL AB 4-0 PS2 18 (SUTURE) ×2 IMPLANT
SUT PROLENE 5 0 C 1 24 (SUTURE) IMPLANT
SUT PROLENE 6 0 BV (SUTURE) ×2 IMPLANT
SUT PROLENE 7 0 BV 1 (SUTURE) ×2 IMPLANT
SUT VIC AB 2-0 CT1 27 (SUTURE)
SUT VIC AB 2-0 CT1 TAPERPNT 27 (SUTURE) IMPLANT
SUT VIC AB 3-0 SH 27 (SUTURE) ×2
SUT VIC AB 3-0 SH 27X BRD (SUTURE) ×2 IMPLANT
TOWEL OR 17X24 6PK STRL BLUE (TOWEL DISPOSABLE) ×4 IMPLANT
TOWEL OR 17X26 10 PK STRL BLUE (TOWEL DISPOSABLE) ×2 IMPLANT
TRAY FOLEY CATH 14FRSI W/METER (CATHETERS) IMPLANT
UNDERPAD 30X30 INCONTINENT (UNDERPADS AND DIAPERS) ×2 IMPLANT
WATER STERILE IRR 1000ML POUR (IV SOLUTION) IMPLANT

## 2012-12-14 NOTE — Anesthesia Postprocedure Evaluation (Signed)
Anesthesia Post Note  Patient: [name]  Procedure(s) Performed: Procedure(s) (LRB): REVISON OF upper arm ARTERIOVENOUS FISTULA using 81mmx10cm gortex graft (Right) INSERTION OF DIALYSIS CATHETER  Anesthesia type: General  Patient location: PACU  Post pain: Pain level controlled and Adequate analgesia  Post assessment: Post-op Vital signs reviewed, Patient's Cardiovascular Status Stable, Respiratory Function Stable, Patent Airway and Pain level controlled  Last Vitals:  Filed Vitals:   12/14/12 1400  BP:   Pulse:   Temp: 36.4 C  Resp:     Post vital signs: Reviewed and stable  Level of consciousness: awake, alert  and oriented  Complications: No apparent anesthesia complications

## 2012-12-14 NOTE — H&P (View-Only) (Signed)
[facility]  Established Dialysis Access  History of Present Illness  [name] is a 47 y.o. (29-Jun-1966) male who presents for re-evaluation of his R BC AVF.  Most recently, he had a R arm fistulogram with venoplasty of the cephalic vein.  Unfortunately, he continues to have poor flow rates on dialysis.  They have difficulties cannulating his fistula.  Past Medical History  Diagnosis Date  . Chills     at night - sometimes  . Fatigue     loss of fatigue  . MRSA (methicillin resistant staph aureus) culture positive   . Diabetes mellitus   . Hypertension   . Poor circulation   . Renal disorder     Past Surgical History  Procedure Date  . Av fistula placement 06-18-11    Right brachiocephalic AVF  . Soft tissue mass excision     Right arm, Left leg  for MRSA infection  . Eye surgery     left eye for Laser, diabetic retinopathy    History   Social History  . Marital Status: Married    Spouse Name: N/A    Number of Children: N/A  . Years of Education: N/A   Occupational History  . Not on file.   Social History Main Topics  . Smoking status: Never Smoker   . Smokeless tobacco: Not on file  . Alcohol Use: No  . Drug Use: No  . Sexually Active: Not on file   Other Topics Concern  . Not on file   Social History Narrative  . No narrative on file    History reviewed. No pertinent family history.  Current Outpatient Prescriptions on File Prior to Visit  Medication Sig Dispense Refill  . amLODipine (NORVASC) 10 MG tablet Take 10 mg by mouth at bedtime.       Marland Kitchen antiseptic oral rinse (BIOTENE) LIQD 15 mLs by Mouth Rinse route as needed.      Marland Kitchen atorvastatin (LIPITOR) 20 MG tablet Take 20 mg by mouth daily.      . B Complex-C-Folic Acid (NEPHRO-VITE PO) Take 1 tablet by mouth daily.      . calcium acetate (PHOSLO) 667 MG capsule Take 667 mg by mouth 3 (three) times daily with meals.      . carvedilol (COREG) 25 MG tablet Take 25 mg by  mouth 2 (two) times daily with a meal.        . cinacalcet (SENSIPAR) 30 MG tablet Take 30 mg by mouth daily.      . fish oil-omega-3 fatty acids 1000 MG capsule Take 1 g by mouth daily.       Marland Kitchen diltiazem (CARDIZEM CD) 240 MG 24 hr capsule Take 240 mg by mouth daily.      . hydrALAZINE (APRESOLINE) 50 MG tablet Take 50 mg by mouth 3 (three) times daily.        Marland Kitchen lanthanum (FOSRENOL) 1000 MG chewable tablet Chew 1,000 mg by mouth. 2 tablets tid with meals      . methocarbamol (ROBAXIN) 500 MG tablet Take 500 mg by mouth 2 (two) times daily.        Allergies  Allergen Reactions  . Amoxicillin Itching  . Percocet (Oxycodone-Acetaminophen) Itching and Other (See Comments)    Legs only. Itching and burning feeling.    Review of Systems (Positive items checked otherwise negative)  General: [ ]  Weight loss, [ ]  Weight gain, [ ]   Loss of appetite, [ ]  Fever  Neurologic: [ ]  Dizziness, [ ]  Blackouts, [ ]  Headaches, [ ]  Seizure  Ear/Nose/Throat: [ ]  Change in eyesight, [ ]  Change in hearing, [ ]  Nose bleeds, [ ]  Sore throat  Vascular: [ ]  Pain in legs with walking, [ ]  Pain in feet while lying flat, [ ]  Non-healing ulcer, Stroke, [ ]  "Mini stroke", [ ]  Slurred speech, [ ]  Temporary blindness, [ ]  Blood clot in vein, [ ]  Phlebitis  Pulmonary: [ ]  Home oxygen, [ ]  Productive cough, [ ]  Bronchitis, [ ]  Coughing up blood,  [ ]  Asthma, [ ]  Wheezing  Musculoskeletal: [ ]  Arthritis, [ ]  Joint pain, [ ]  Muscle pain  Cardiac: [ ]  Chest pain, [ ]  Chest tightness/pressure, [ ]  Shortness of breath when lying flat, [ ]  Shortness of breath with exertion, [ ]  Palpitations, [ ]  Heart murmur, [ ]  Arrythmia,  [ ]  Atrial fibrillation  Hematologic: [ ]  Bleeding problems, [ ]  Clotting disorder, [ ]  Anemia  Psychiatric:  [ ]  Depression, [ ]  Anxiety, [ ]  Attention deficit disorder  Gastrointestinal:  [ ]  Black stool,[ ]   Blood in stool, [ ]  Peptic ulcer disease, [ ]  Reflux, [ ]  Hiatal hernia, [ ]  Trouble  swallowing, [ ]  Diarrhea, [ ]  Constipation  Urinary:  [x]  Kidney disease, [ ]  Burning with urination, [ ]  Frequent urination, [ ]  Difficulty urinating  Skin: [ ]  Ulcers, [ ]  Rashes   Physical Examination  Filed Vitals:   11/19/12 1526  BP: 115/81  Pulse: 95  Height: 5\' 10"  (1.778 m)  Weight: 233 lb 9.6 oz (105.96 kg)  SpO2: 100%   Body mass index is 33.52 kg/(m^2).  General: A&O x 3, WDWN  Pulmonary: Sym exp, good air movt, CTAB, no rales, rhonchi, & wheezing  Cardiac: RRR, Nl S1, S2, no Murmurs, rubs or gallops  Gastrointestinal: soft, NTND, -G/R, - HSM, - masses, - CVAT B  Musculoskeletal: M/S 5/5 throughout , Extremities without  ischemic changes , prior cannulation scar clustered into two areas  Neurologic: CN 2-12 intact , Pain and light touch intact in extremities , Motor exam as listed above  Medical Decision Making  [name] is a 47 y.o. 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   After reviewing the fistulogram, I recommend replacing the stenotic segment with a short segment prosthetic graft versus vs. Excision of the stenotic segment and proximization of anastomosis via revision.     Depending on length of fistula lost, this second option may be a better option. Risk, benefits, and alternatives to access surgery were discussed.  The patient is aware the risks include but are not limited to: bleeding, infection, steal syndrome, nerve damage, ischemic monomelic neuropathy, failure to mature, need for additional procedures, death and stroke.   The patient agrees to proceed forward with the procedure on 18 FEB 14.  Adele Barthel, MD [facility] Office: [phone] Pager: [phone]  11/19/2012, 4:49 PM

## 2012-12-14 NOTE — Progress Notes (Signed)
CLIENT C/O RIGHT UPPER ARM FEELING MORE SWOLLEN AND RIGHT HAND PAINFUL; RIGHT RADIAL PULSE 2+; RIGHT HAND COOL AND COLOR PINK; CAPILLARY REFILL LESS THAN 3SEC; DR DICKSON NOTIFIED OF ABOVE AND NO NEW ORDERS NOTED AND OK TO D/C HOME AND CLIENT ADVISED PER DR DICKSON THAT HE CAN CALL THE OFFICE TOMORROW AND GET FOLLOWUP APPT WITH DR Bridgett Larsson AND CLIENT VOICED UNDERSTANDING

## 2012-12-14 NOTE — Interval H&P Note (Signed)
History and Physical Interval Note:  12/14/2012 8:14 AM  [name]  has presented today for surgery, with the diagnosis of ESRD  The various methods of treatment have been discussed with the patient and family. After consideration of risks, benefits and other options for treatment, the patient has consented to  Procedure(s) with comments: [employer] (Right) - POSSIBLE INTERPOSITION as a surgical intervention .  The patient's history has been reviewed, patient examined, no change in status, stable for surgery.  I have reviewed the patient's chart and labs.  Questions were answered to the patient's satisfaction.     CHEN,BRIAN LIANG-YU

## 2012-12-14 NOTE — Anesthesia Postprocedure Evaluation (Signed)
  Anesthesia Post-op Note  Patient: [name]  Procedure(s) Performed: Procedure(s): EVACUATION HEMATOMA (Right)  Patient Location: PACU  Anesthesia Type:General  Level of Consciousness: awake  Airway and Oxygen Therapy: Patient Spontanous Breathing  Post-op Pain: mild  Post-op Assessment: Post-op Vital signs reviewed  Post-op Vital Signs: Reviewed  Complications: No apparent anesthesia complications

## 2012-12-14 NOTE — Op Note (Signed)
OPERATIVE NOTE   PROCEDURE: 1. Right arm exploration  PRE-OPERATIVE DIAGNOSIS: Post-operative hematoma  POST-OPERATIVE DIAGNOSIS: Symptomatic soft tissue swelling  SURGEON: Adele Barthel, MD  ASSISTANT(S): Leontine Locket, PAC   ANESTHESIA: general  ESTIMATED BLOOD LOSS: 50 cc  FINDING(S): 1. No hematoma and little bleeding 2. Swelling in soft tissue with serous leakage from Goretex graft  SPECIMEN(S):  none  INDICATIONS:   [name] is a 47 y.o. male who earlier underwent revision of his right brachiocephalic arteriovenous fistula with an interposition graft.  In the holding area, he began developing swelling in the antecubitum suspicious for a symptomatic hematoma.  Due to the presence of fresh graft, I felt the risk of possibly seeding infection to the graft from adjacent hematoma merited evacuation of the hematoma.  Risk, benefits, and alternatives to access surgery were discussed.  The patient is aware the risks include but are not limited to: bleeding, infection, steal syndrome, nerve damage, ischemic monomelic neuropathy, failure to mature, need for additional procedures, death and stroke.  The patient agrees to proceed forward with the procedure.  DESCRIPTION: After obtaining full informed written consent, the patient was brought back to the operating room and placed supine upon the operating table.  The patient received IV antibiotics prior to induction.  After obtaining adequate anesthesia, the patient was prepped and draped in the standard fashion for: right arm access procedure.  I took off the previous Dermabond.  I sharply transected the three layer closure.  In this process, I drained no hematoma and minimal bleeding was noted.  The subcutaneous tissue looked more edematous that previously.  The graft demonstrated transgraft leak of serous fluid but nothing consistent with a frank graft allergy.  I washed out the entire surgical wound and applied thrombin and gelfoam.  I  washed out the gelfoam and no further active bleeding was noted.  The subcutaneous tissue immediately over the graft was reapproximated.  I then reapproximated the more superficial subcutaneous tissue.  The skin was reapproximated with a running subcuticular of 4-0 Monocryl.  The skin was clean, dried, and reapproximated with Dermabond.  The arm was wrapped with a Kerlix and then the upper arm was gently wrapped with an ACE wrap.  There was palpable thrill at the end of the case with dopplerable radial signal.  COMPLICATIONS: none  CONDITION: stable  Adele Barthel, MD [facility] Office: [phone] Pager: [phone]  12/14/2012, 3:28 PM

## 2012-12-14 NOTE — Anesthesia Preprocedure Evaluation (Signed)
Anesthesia Evaluation  Patient identified by MRN, date of birth, ID band Patient awake    Reviewed: Allergy & Precautions, H&P , NPO status , Patient's Chart, lab work & pertinent test results  History of Anesthesia Complications (+) PONV  Airway Mallampati: II  Neck ROM: full    Dental   Pulmonary          Cardiovascular hypertension, + Peripheral Vascular Disease     Neuro/Psych  Headaches,    GI/Hepatic   Endo/Other  diabetes, Type 2obese  Renal/GU ESRFRenal disease     Musculoskeletal   Abdominal   Peds  Hematology   Anesthesia Other Findings   Reproductive/Obstetrics                           Anesthesia Physical Anesthesia Plan  ASA: III  Anesthesia Plan: General   Post-op Pain Management:    Induction: Intravenous  Airway Management Planned: LMA  Additional Equipment:   Intra-op Plan:   Post-operative Plan:   Informed Consent: I have reviewed the patients History and Physical, chart, labs and discussed the procedure including the risks, benefits and alternatives for the proposed anesthesia with the patient or authorized representative who has indicated his/her understanding and acceptance.     Plan Discussed with: CRNA and Surgeon  Anesthesia Plan Comments:         Anesthesia Quick Evaluation

## 2012-12-14 NOTE — Op Note (Signed)
OPERATIVE NOTE   PROCEDURE: 1.  Right arm exploration 2.  Revision of fistula with interposition graft 3. Left internal jugular vein vein tunneled dialysis catheter placement 4. Left internal jugular vein vein cannulation under ultrasound guidance   PRE-OPERATIVE DIAGNOSIS: end stage renal disease, poor flow rates and cannulation of left brachiocephalic arteriovenous fistula    POST-OPERATIVE DIAGNOSIS: same as above   SURGEON: Adele Barthel, MD  ASSISTANT(S): Gerri Lins, PAC   ANESTHESIA: general  ESTIMATED BLOOD LOSS: 50 cc  FINDING(S): 1. Severe stenosis in distal fistula 2. Neointimal hyperplasia in angioplastied segment 3. Pseudoaneurysmal distal 1/3 of fistula 4. Palpable thrill and dopplerable radial signal at end of case 5. No pneumothorax on fluoroscopy 6. Tips of catheter in right atrium  SPECIMEN(S):  none  INDICATIONS:   [name] is a 47 y.o. male who presents with recurrent stenosis in mid-segment of right brachiocephalic arteriovenous fistula.  Despite successful venoplasty, the patient continues to have poor flow rates and difficulty cannulating this access.  I recommend an attempt at revision of the right brachiocephalic arteriovenous fistula with an interposition graft as an attempt at salvaging this fistula.  Risk, benefits, and alternatives to access surgery were discussed.  The patient is aware the risks include but are not limited to: bleeding, infection, steal syndrome, nerve damage, ischemic monomelic neuropathy, failure to mature, need for additional procedures, death and stroke.  The patient agrees to proceed forward with the procedure.   DESCRIPTION: After obtaining full informed written consent, the patient was brought back to the operating room and placed supine upon the operating table.  The patient received IV antibiotics prior to induction.  After obtaining adequate anesthesia, the patient was prepped and draped in the standard fashion for:  right access procedure.  I marked out the stenotic segment in the fistula under sonosite guidance.  I made an incision over this segment and using blunt dissection and electrocautery, I dissected out the right brachiocephalic arteriovenous fistula.  Immediately, it became evident there was severe inflow compromise, as I could essentially feel very little flow in the fistula even with compression.  I extended the incision distally and dissected out the distal segment of this fistula.  The distal segment of this fistula was severely stenosed and scarred.  I could barely feel a pulse in the fistula despite a normal blood pressure at this time.  I felt that revision of this fistula with an interposition graft with new anastomosis was going to be necessary.  I then dissected out the brachial artery more proximally with blunt dissection and electrocautery with some difficulty.  The brachial artery appeared externally 3 mm without significant disease.  I placed vessel loops proximally and distally.  I then gave the patient 3000 units of Heparin to obtain some degree of anticoagulation.  I tied off the fistula distally, leaving essentially a vein patch on the artery, with two 2-0 silk ties.  I then transected this fistula distally.  There was minimal lumen in this distal segment with extensive thickening.  I open the fistula longitudinally until I reached a segment that was relatively disease free.  I dissected just proximal to this segment and spatulated the vein to facilitate an end-to-end anastomosis.  I obtained a 6 mm Goretex graft and spatulated one end to facilitate the end-to-end anastomosis.  The graft was sewn to the proximal fistula with a running stitch of 6-0 prolene.  I released the clamp and there was excellent backbleeding present.  I reclamped the  graft distal to the anastomosis.  I then placed the brachial artery under tension proximally and distally with vessel loops.  I made an arteriotomy with a  11-blade and extended it with a Potts scissor.  The distal end of the graft was was spatulated to the geometry of the graft.  The graft was sewn to the brachial artery in an end-to-side configuration with a running stitch of 6-0 Prolene.  Prior to completing this anastomosis, I backbled the two ends of the brachial artery and allowed the graft to backbleed.  No thrombus was noted in any vessel.  I completed the anastomosis in the usual fashion.  I placed thrombin and gelfoam along the entire incision.  I made a 6 mm thick skin flap to cover the graft.  I washed out the incision and controlled residual bleeding with electrocautery.  I reapproximated the subcutaneous tissue over the graft and fistula with a double layer of 3-0 Vicryl.  The skin was then reapproximated with a running subcuticular of 4-0 Monocryl.  The skin was cleaned, dried, and reinforced with Dermabond.  At the end of this portion of this case, there was a palpable thrill and a dopperable radial signal.  The drapes were taken down and the surgical table was repositioned for a tunneled dialysis catheter placement.  The patient was prepped and draped in the standard fashion for a chest or neck tunneled dialysis catheter placement. Under ultrasound guidance, the left internal jugular vein vein was cannulated with the 18 gauge needle.  A J-wire was then placed down in the right ventricle under fluroscopic guidance.  The wire was then secured in place with a clamp to the drapes.  I then made stab incisions at the neck and exit sites.  I dissected from the chest to the neck and dilated the subcutaneous tunnel with a plastic dilator.  The wire was then unclamped and I removed the needle.  An end-hole catheter was loaded over the wire and advanced into the superior vena cava.  The wire was then exchanged for an Amplatz super stiff wire.  The catheter was then removed.  Over the wire, the skin tract and venotomy was dilated serially with dilators.   Finally, the dilator-sheath was placed over the wire under fluroscopic guidance into the superior vena cava.  The dilator was removed.  A 27 cm Diatek catheter was woven over the wire and advanced under fluoroscopic guidance down into the right atrium.  The wire was then removed, and the sheath was broken and peeled away while holding the catheter cuff at the level of the skin.  The back end of this catheter was transected, revealing the two lumens of this catheter.  The ports were docked onto these two lumens.  The catheter hub was then screwed into place.  Each port was tested by aspirating and flushing.  No resistance was noted.  Each port was then thoroughly flushed with heparinized saline.  The catheter was secured in placed with two interrupted stitches of 3-0 Nylon tied to the catheter.  The neck incision was closed with a U-stitch of 4-0 Monocryl.  The neck and chest incision were cleaned and sterile bandages applied.  Each port was then loaded with concentrated heparin (1000 Units/mL) at the manufacturer recommended volumes to each port.  Sterile caps were applied to each port.  On completion fluoroscopy, the tips of the catheter were in the right atrium, and there was no evidence of pneumothorax.  COMPLICATIONS: none   CONDITION:  stable  Adele Barthel, MD [facility] Office: [phone] Pager: [phone]  12/14/2012, 11:48 AM

## 2012-12-14 NOTE — Progress Notes (Signed)
Spoke with Melissa in Infection control. Pt. Stated he used mupirocin ointment once a day for 5 days. She stated to tell pt. When he returned home to use the ointment 2x a day for 5 days. She stated no treatment was needed prior to surgery.

## 2012-12-14 NOTE — Telephone Encounter (Addendum)
12/14/12- sent letter, dpm   Message copied by Gena Fray on Tue Dec 14, 2012  1:36 PM ------      Message from: Alfonso Patten      Created: Tue Dec 14, 2012  1:16 PM                   ----- Message -----         From: Conrad Inkster, MD         Sent: 12/14/2012  12:07 PM           To: Patrici Ranks, Alfonso Patten, RN            KHY RYBA      [id number]      [birth year]            PROCEDURE:      1.  Right arm exploration      2.  Revision of fistula with interposition graft      3. Left internal jugular vein vein tunneled dialysis catheter placement      4. Left internal jugular vein vein cannulation under ultrasound guidance            Asst: Gerri Lins, PAC             Follow-up: 4 weeks       ------

## 2012-12-14 NOTE — Transfer of Care (Signed)
Immediate Anesthesia Transfer of Care Note  Patient: [name]  Procedure(s) Performed: Procedure(s): EVACUATION HEMATOMA (Right)  Patient Location: PACU  Anesthesia Type:General  Level of Consciousness: patient cooperative, lethargic and responds to stimulation  Airway & Oxygen Therapy: Patient Spontanous Breathing and Patient connected to nasal cannula oxygen  Post-op Assessment: Report given to PACU RN, Post -op Vital signs reviewed and stable and Patient moving all extremities X 4  Post vital signs: Reviewed and stable  Complications: No apparent anesthesia complications

## 2012-12-14 NOTE — Anesthesia Preprocedure Evaluation (Signed)
Anesthesia Evaluation  Patient identified by MRN, date of birth, ID band Patient awake    Reviewed: Allergy & Precautions, H&P , NPO status , Patient's Chart, lab work & pertinent test results  History of Anesthesia Complications (+) PONV  Airway Mallampati: II  Neck ROM: full    Dental   Pulmonary          Cardiovascular hypertension, + Peripheral Vascular Disease     Neuro/Psych  Headaches,    GI/Hepatic   Endo/Other  diabetes, Type 2obese  Renal/GU ESRFRenal disease     Musculoskeletal   Abdominal   Peds  Hematology   Anesthesia Other Findings   Reproductive/Obstetrics                           Anesthesia Physical  Anesthesia Plan  ASA: III  Anesthesia Plan: General   Post-op Pain Management:    Induction: Intravenous  Airway Management Planned: LMA  Additional Equipment:   Intra-op Plan:   Post-operative Plan:   Informed Consent: I have reviewed the patients History and Physical, chart, labs and discussed the procedure including the risks, benefits and alternatives for the proposed anesthesia with the patient or authorized representative who has indicated his/her understanding and acceptance.     Plan Discussed with: CRNA and Surgeon  Anesthesia Plan Comments:         Anesthesia Quick Evaluation

## 2012-12-14 NOTE — Transfer of Care (Signed)
Immediate Anesthesia Transfer of Care Note  Patient: [name]  Procedure(s) Performed: Procedure(s): REVISON OF upper arm ARTERIOVENOUS FISTULA using 62mmx10cm gortex graft (Right) INSERTION OF DIALYSIS CATHETER  Patient Location: PACU  Anesthesia Type:General  Level of Consciousness: awake, oriented, patient cooperative and responds to stimulation  Airway & Oxygen Therapy: Patient Spontanous Breathing and Patient connected to nasal cannula oxygen  Post-op Assessment: Report given to PACU RN, Post -op Vital signs reviewed and stable and Patient moving all extremities X 4  Post vital signs: Reviewed and stable  Complications: No apparent anesthesia complications

## 2012-12-14 NOTE — OR Nursing (Addendum)
Revision of fistula Procedure 1: start time 0902, end time 1120  Procedure 2 Time out preformed @ 1129, Procedure 2: Start time 1131

## 2012-12-15 DIAGNOSIS — T829XXA Unspecified complication of cardiac and vascular prosthetic device, implant and graft, initial encounter: Secondary | ICD-10-CM | POA: Insufficient documentation

## 2012-12-16 ENCOUNTER — Encounter: Payer: Self-pay | Admitting: Neurosurgery

## 2012-12-16 ENCOUNTER — Ambulatory Visit (INDEPENDENT_AMBULATORY_CARE_PROVIDER_SITE_OTHER): Payer: BC Managed Care – PPO | Admitting: Neurosurgery

## 2012-12-16 VITALS — BP 123/84 | HR 102 | Temp 97.8°F | Resp 18 | Ht 70.5 in | Wt 239.4 lb

## 2012-12-16 DIAGNOSIS — N186 End stage renal disease: Secondary | ICD-10-CM

## 2012-12-16 NOTE — Progress Notes (Signed)
Subjective:     Patient ID: [name], male   DOB: 02-Sep-1966, 47 y.o.   MRN: [id number]  HPI: 47 year old male patient that underwent revision of a right brachiocephalic AV fistula with interposition graft and subsequently attempted evacuation for what is believed to be a hematoma in the operative area the same day which was favored 25th 2014 by Dr. Bridgett Larsson. The patient of the office due to some drainage and swelling was brought in for evaluation. The patient denies acute pain but does have a small amount of edema along the suture line.   Review of Systems: 12 point review of systems is notable for the difficulties described above otherwise unremarkable     Objective:   Physical Exam: Afebrile, vital signs are stable, the surgical incision is intact Dermabond is in place there is very mild redness at the distal portion of the incision line and currently no drainage. The patient reports "a few drops" of what appeared to be serous fluid from the mid incision line early today. Nothing since that time. There is audible flow through the fistula itself     Assessment:     Dr. Oneida Alar spoke with the patient at length and explained that his wound does look good considering he had 2 procedures in one day. He was instructed to keep the wound clean with soap and water and keep the arm elevated and followup with Dr. Bridgett Larsson in 2 weeks.    Plan:     The patient knows to call the office if he has increasing redness or edema. Otherwise she will followup with Dr. Bridgett Larsson for a wound check in 2 weeks. The patient's in agreement with this, his questions were encouraged and answered.  Beatris Ship ANP  Clinic M.D.: [place]

## 2012-12-30 ENCOUNTER — Encounter: Payer: Self-pay | Admitting: Vascular Surgery

## 2012-12-31 ENCOUNTER — Encounter: Payer: Self-pay | Admitting: Vascular Surgery

## 2012-12-31 ENCOUNTER — Ambulatory Visit (INDEPENDENT_AMBULATORY_CARE_PROVIDER_SITE_OTHER): Payer: BC Managed Care – PPO | Admitting: Vascular Surgery

## 2012-12-31 VITALS — BP 127/80 | HR 107 | Resp 16 | Ht 71.0 in | Wt 234.0 lb

## 2012-12-31 DIAGNOSIS — N186 End stage renal disease: Secondary | ICD-10-CM

## 2012-12-31 NOTE — Progress Notes (Signed)
[facility]  Postoperative Access Visit  History of Present Illness  [name] is a 47 y.o. year old male who presents for postoperative follow-up for:  1. Right arm exploration  2. Revision of fistula with interposition graft  3. Left internal jugular vein vein tunneled dialysis catheter placement  4. Left internal jugular vein vein cannulation under ultrasound guidance  (Date: 12/14/12).  The distal fistula severely compromised, requiring replacement with a segment of graft. The patient's wounds are healed.  The patient notes no steal symptoms.  The patient is able to complete their activities of daily living.  The patient's current symptoms are: none.  Physical Examination  Filed Vitals:   12/31/12 1559  BP: 127/80  Pulse: 107  Resp: 16   RUE: Incision is nearly healed, skin feels warm, hand grip is 5/5, sensation in digits is intact, strongly palpable thrill, strong bruit can be auscultated   Medical Decision Making  [name] is a 47 y.o. year old male who presents s/p revision of R BC AVF with interposition graft.  The patient's access will be ready for use in 1-2 weeks.  The patient's tunneled dialysis catheter can be removed after two successful cannulations and completed dialysis treatments.  Thank you for allowing [place] to participate in this patient's care.  Adele Barthel, MD [facility] Office: [phone] Pager: [phone]

## 2013-01-06 ENCOUNTER — Other Ambulatory Visit: Payer: Self-pay | Admitting: Nephrology

## 2013-01-06 ENCOUNTER — Ambulatory Visit
Admission: RE | Admit: 2013-01-06 | Discharge: 2013-01-06 | Disposition: A | Discharge status: Home / Self Care | Payer: BC Managed Care – PPO | Source: Ambulatory Visit | Attending: Nephrology | Admitting: Nephrology

## 2013-01-06 DIAGNOSIS — R059 Cough, unspecified: Secondary | ICD-10-CM

## 2013-01-06 DIAGNOSIS — R6883 Chills (without fever): Secondary | ICD-10-CM

## 2013-01-06 DIAGNOSIS — Z992 Dependence on renal dialysis: Secondary | ICD-10-CM

## 2013-01-09 ENCOUNTER — Ambulatory Visit: Payer: BC Managed Care – PPO | Admitting: Family Medicine

## 2013-01-09 VITALS — BP 139/74 | HR 72 | Temp 98.9°F | Resp 18 | Ht 69.25 in | Wt 231.0 lb

## 2013-01-09 DIAGNOSIS — J309 Allergic rhinitis, unspecified: Secondary | ICD-10-CM

## 2013-01-09 DIAGNOSIS — J019 Acute sinusitis, unspecified: Secondary | ICD-10-CM

## 2013-01-09 DIAGNOSIS — R05 Cough: Secondary | ICD-10-CM

## 2013-01-09 DIAGNOSIS — R059 Cough, unspecified: Secondary | ICD-10-CM

## 2013-01-09 MED ORDER — FLUTICASONE PROPIONATE 50 MCG/ACT NA SUSP: 2.0000 | Freq: Every day | NASAL | Status: DC

## 2013-01-09 MED ORDER — HYDROCODONE-HOMATROPINE 5-1.5 MG/5ML PO SYRP: 5.0000 mL | ORAL_SOLUTION | Freq: Three times a day (TID) | ORAL | Status: DC | PRN

## 2013-01-09 MED ORDER — PREDNISONE 10 MG PO TABS: ORAL_TABLET | ORAL | Status: DC

## 2013-01-09 MED ORDER — AZITHROMYCIN 250 MG PO TABS: ORAL_TABLET | ORAL | Status: DC

## 2013-01-09 NOTE — Progress Notes (Signed)
[facility]:  Office Visit  Chief Complaint:  Chief Complaint  Patient presents with  . Cough    all symptoms since tuesday  . Sore Throat  . Nasal Congestion    HPI: [name] is a 47 y.o. male who complains of  Sinus tenderness, low grade fever. Sorethroat. Feels tired. Also has productive white/yellow cough and allergy symptoms. Has tried otc cough meds and also tylenol for sinus HA. Has stage 3 renal disease from diabetes.   Past Medical History  Diagnosis Date  . Chills     at night - sometimes  . Fatigue     loss of fatigue  . MRSA (methicillin resistant staph aureus) culture positive   . Poor circulation   . Renal disorder   . Complication of anesthesia   . PONV (postoperative nausea and vomiting)   . Heart murmur     years ago  . History of blood transfusion   . Eczema   . Headache     Years ago  . Hypertension     sees Dr. Jaci Standard  . Diabetes mellitus     controlled by diet   Past Surgical History  Procedure Laterality Date  . Av fistula placement  06-18-11    Right brachiocephalic AVF  . Soft tissue mass excision      Right arm, Left leg  for MRSA infection  . Eye surgery      left eye for Laser, diabetic retinopathy  . Hematoma evacuation Right Feb. 25, 2014  . Revison of arteriovenous fistula Right 12/14/2012    Procedure: REVISON OF upper arm ARTERIOVENOUS FISTULA using 52mmx10cm gortex graft;  Surgeon: Conrad Moca, MD;  Location: [facility];  Service: Vascular;  Laterality: Right;  . Insertion of dialysis catheter  12/14/2012    Procedure: INSERTION OF DIALYSIS CATHETER;  Surgeon: Conrad [place], MD;  Location: [facility];  Service: Vascular;;  . Hematoma evacuation Right 12/14/2012    Procedure: EVACUATION HEMATOMA;  Surgeon: Conrad [place], MD;  Location: [facility];  Service: Vascular;  Laterality: Right;   History   Social History  . Marital Status: Married    Spouse Name: N/A    Number of Children: N/A  . Years of Education: N/A    Social History Main Topics  . Smoking status: Never Smoker   . Smokeless tobacco: Never Used  . Alcohol Use: No  . Drug Use: No  . Sexually Active: Yes   Other Topics Concern  . None   Social History Narrative  . None   Family History  Problem Relation Age of Onset  . Diabetes Mother    Allergies  Allergen Reactions  . Amoxicillin Itching  . Percocet (Oxycodone-Acetaminophen) Itching and Other (See Comments)    Legs only. Itching and burning feeling.   Prior to Admission medications   Medication Sig Start Date End Date Taking? Authorizing Provider  amLODipine (NORVASC) 10 MG tablet Take 10 mg by mouth at bedtime.    Yes Historical Provider, MD  atorvastatin (LIPITOR) 20 MG tablet daily. 11/16/12  Yes Historical Provider, MD  calcium acetate (PHOSLO) 667 MG capsule Take 667 mg by mouth 3 (three) times daily with meals.   Yes Historical Provider, MD  antiseptic oral rinse (BIOTENE) LIQD 15 mLs by Mouth Rinse route as needed.    Historical Provider, MD  B Complex-C-Folic Acid (NEPHRO-VITE PO) Take 1 tablet by mouth daily.    Historical Provider, MD  carvedilol (COREG) 25  MG tablet Take 25 mg by mouth 2 (two) times daily with a meal.      Historical Provider, MD  cinacalcet (SENSIPAR) 30 MG tablet Take 30 mg by mouth daily.    Historical Provider, MD  diltiazem (CARDIZEM CD) 240 MG 24 hr capsule Take 240 mg by mouth daily.    Historical Provider, MD  hydrALAZINE (APRESOLINE) 50 MG tablet Take 50 mg by mouth 3 (three) times daily.      Historical Provider, MD  HYDROcodone-acetaminophen (NORCO) 5-325 MG per tablet Take 1 tablet by mouth every 6 (six) hours as needed for pain. 12/14/12   Ulyses Amor, PA-C  lanthanum (FOSRENOL) 1000 MG chewable tablet Chew 1,000 mg by mouth. 2 tablets tid with meals    Historical Provider, MD  methocarbamol (ROBAXIN) 500 MG tablet Take 500 mg by mouth 2 (two) times daily.    Historical Provider, MD     ROS: The patient denies  night sweats,  unintentional weight loss, chest pain, palpitations, wheezing, dyspnea on exertion, nausea, vomiting, abdominal pain, dysuria, hematuria, melena, numbness, weakness, or tingling.   All other systems have been reviewed and were otherwise negative with the exception of those mentioned in the HPI and as above.    PHYSICAL EXAM: Filed Vitals:   01/09/13 0847  BP: 139/74  Pulse: 72  Temp: 98.9 F (37.2 C)  Resp: 18   Filed Vitals:   01/09/13 0847  Height: 5' 9.25" (1.759 m)  Weight: 231 lb (104.781 kg)   Body mass index is 33.86 kg/(m^2).  General: Alert, no acute distress HEENT:  Normocephalic, atraumatic, oropharynx patent. + PND, +max sinus tenderness, boggy nares.  Cardiovascular:  Regular rate and rhythm, no rubs murmurs or gallops.  No Carotid bruits, radial pulse intact. No pedal edema.  Respiratory: Clear to auscultation bilaterally.  No wheezes, rales, or rhonchi.  No cyanosis, no use of accessory musculature GI: No organomegaly, abdomen is soft and non-tender, positive bowel sounds.  No masses. Skin: No rashes. Neurologic: Facial musculature symmetric. Psychiatric: Patient is appropriate throughout our interaction. Lymphatic: No cervical lymphadenopathy Musculoskeletal: Gait intact.   LABS: Results for orders placed during the hospital encounter of 12/14/12  GLUCOSE, CAPILLARY      Result Value Range   Glucose-Capillary 212 (*) 70 - 99 mg/dL  GLUCOSE, CAPILLARY      Result Value Range   Glucose-Capillary 164 (*) 70 - 99 mg/dL   Comment 1 Notify RN    POCT I-STAT 4, (NA,K, GLUC, HGB,HCT)      Result Value Range   Sodium 134 (*) 135 - 145 mEq/L   Potassium 4.9  3.5 - 5.1 mEq/L   Glucose, Bld 227 (*) 70 - 99 mg/dL   HCT 33.0 (*) 39.0 - 52.0 %   Hemoglobin 11.2 (*) 13.0 - 17.0 g/dL     EKG/XRAY:   Primary read interpreted by Dr. Marin Comment at [facility].   ASSESSMENT/PLAN: Encounter Diagnoses  Name Primary?  . Allergic rhinitis   . Acute sinusitis Yes  . Cough    Rx  Prednisone, Z pack Rx Flonase Rx Hydromet No NSAIDs, may take tylenol F/u prn    Ethelyne Erich, [place], DO 01/09/2013 9:28 AM

## 2013-01-09 NOTE — Patient Instructions (Addendum)

## 2013-01-13 ENCOUNTER — Encounter: Payer: Self-pay | Admitting: Vascular Surgery

## 2013-01-14 ENCOUNTER — Ambulatory Visit: Payer: BC Managed Care – PPO | Admitting: Vascular Surgery

## 2013-02-10 ENCOUNTER — Other Ambulatory Visit: Payer: Self-pay | Admitting: *Deleted

## 2013-02-15 ENCOUNTER — Ambulatory Visit (HOSPITAL_COMMUNITY)
Admission: RE | Admit: 2013-02-15 | Discharge: 2013-02-15 | Disposition: A | Discharge status: Home / Self Care | Payer: BC Managed Care – PPO | Source: Ambulatory Visit | Attending: Vascular Surgery | Admitting: Vascular Surgery

## 2013-02-15 DIAGNOSIS — L259 Unspecified contact dermatitis, unspecified cause: Secondary | ICD-10-CM | POA: Insufficient documentation

## 2013-02-15 DIAGNOSIS — N186 End stage renal disease: Secondary | ICD-10-CM

## 2013-02-15 DIAGNOSIS — Z452 Encounter for adjustment and management of vascular access device: Secondary | ICD-10-CM | POA: Insufficient documentation

## 2013-02-15 DIAGNOSIS — IMO0002 Reserved for concepts with insufficient information to code with codable children: Secondary | ICD-10-CM | POA: Insufficient documentation

## 2013-02-15 DIAGNOSIS — E119 Type 2 diabetes mellitus without complications: Secondary | ICD-10-CM | POA: Insufficient documentation

## 2013-02-15 DIAGNOSIS — Z79899 Other long term (current) drug therapy: Secondary | ICD-10-CM | POA: Insufficient documentation

## 2013-02-15 DIAGNOSIS — Z992 Dependence on renal dialysis: Secondary | ICD-10-CM | POA: Insufficient documentation

## 2013-02-15 NOTE — Progress Notes (Signed)
[facility] Catheter Removal Procedure Note  Diagnosis: ESRD  Plan:  Remove left diatek catheter  Consent signed:  yes Time out completed:  yes Coumadin:  no PT/INR (if applicable):   Other labs:  Procedure: 1.  Sterile prepping and draping over catheter area 2. 6 ml 2% lidocaine plain instilled at removal site. 3.  left catheter removed in its entirety with cuff in tact. 4.  Complications:  None 5. Tip of catheter sent for culture:  no   Patient tolerated procedure well:  yes Pressure held, no bleeding noted, dressing applied Instructions given to the pt regarding wound care and bleeding.  Other:  Leontine Locket, PA-C 02/15/2013 12:16 PM

## 2013-02-15 NOTE — Progress Notes (Signed)
[facility] Catheter Removal Procedure Note  Diagnosis: ESRD with Functioning AVF/AVGG  Plan:  Remove left diatek catheter  Consent signed:  yes Time out completed:  yes Coumadin:  no PT/INR (if applicable):   Other labs:   Procedure: 1.  Sterile prepping and draping over catheter area 2. 3 ml 2% lidocaine plain instilled at removal site. 3.  left catheter removed in its entirety with cuff in tact. 4.  Complications: none  5. Tip of catheter sent for culture:  no   Patient tolerated procedure well:  yes Pressure held, no bleeding noted, dressing applied Instructions given to the pt regarding wound care and bleeding.  Other: Catheter removed by Leontine Locket, PA-C  Jewett J 02/15/2013 11:37 AM

## 2013-02-15 NOTE — H&P (Signed)
[facility] SHORT STAY H&P  CC: ESRD   HPI: [name] is a 47 y.o. male who has been on HD through  functioning Hemodialysis access in the right upper extremity. They are here for HD catheter removal. Pt. denies signs of steal syndrome. He states they have been successfully using fistula for 2 weeks. He states a "hard Knot" came up in the middle of fistula and they are avoiding this area during HD  Past Medical History  Diagnosis Date  . Chills     at night - sometimes  . Fatigue     loss of fatigue  . MRSA (methicillin resistant staph aureus) culture positive   . Poor circulation   . Renal disorder   . Complication of anesthesia   . PONV (postoperative nausea and vomiting)   . Heart murmur     years ago  . History of blood transfusion   . Eczema   . Headache     Years ago  . Hypertension     sees Dr. Jaci Standard  . Diabetes mellitus     controlled by diet    FH:  Non-Contributory  Social HX History  Substance Use Topics  . Smoking status: Never Smoker   . Smokeless tobacco: Never Used  . Alcohol Use: No    Allergies Allergies  Allergen Reactions  . Amoxicillin Itching  . Percocet (Oxycodone-Acetaminophen) Itching and Other (See Comments)    Legs only. Itching and burning feeling.    Medications Current Outpatient Prescriptions  Medication Sig Dispense Refill  . amLODipine (NORVASC) 10 MG tablet Take 10 mg by mouth at bedtime.       Marland Kitchen atorvastatin (LIPITOR) 20 MG tablet daily.      Marland Kitchen azithromycin (ZITHROMAX) 250 MG tablet Take 2 tabs po now then 1 tab po daily for 4 days  6 tablet  0  . calcium acetate (PHOSLO) 667 MG capsule Take 667 mg by mouth 3 (three) times daily with meals.      . fluticasone (FLONASE) 50 MCG/ACT nasal spray Place 2 sprays into the nose daily.  16 g  6  . HYDROcodone-homatropine (HYCODAN) 5-1.5 MG/5ML syrup Take 5 mLs by mouth every 8 (eight) hours as needed for cough.  120 mL  0  . predniSONE (DELTASONE) 10 MG tablet  Take 3 tabs po daily for 3 days, then 2 tabs po daily for 3 days, then 1 tab po daily for 3 days.  18 tablet  0   No current facility-administered medications for this encounter.    Labs COAG Lab Results  Component Value Date   INR 1.14 06/18/2011   INR 1.40 06/10/2011   INR 1.0 08/10/2008   No results found for this basename: PTT    PHYSICAL EXAM  Filed Vitals:   02/15/13 1056  BP: 153/86  Pulse: 91  Temp: 98.3 F (36.8 C)    General:  WDWN in NAD HENT: WNL Eyes: Pupils equal Pulmonary: normal non-labored breathing  Cardiac: RRR, Skin: normal, no cyanosis, jaundice, pallor or bruising Vascular Exam/Pulses: 2+ radial pulses in RIGHT upper extremity. Extremities without ischemic changes, no Gangrene , no cellulitis; no open wounds;   There is a good thrill and good bruit in the BVT. Hand grip is 5/5 and sensation in digits is intact;   Impression: This is a 47 y.o. male who has a functioning HD access.  Plan: Removal of Left IJ HD catheter Elver Stadler J 02/15/2013  11:34 AM

## 2013-02-17 NOTE — H&P (Signed)
Agree with above.  Deitra Mayo, MD, [place] [phone] 02/17/2013

## 2013-03-16 ENCOUNTER — Encounter (HOSPITAL_COMMUNITY): Payer: Self-pay | Admitting: Emergency Medicine

## 2013-03-16 ENCOUNTER — Emergency Department (HOSPITAL_COMMUNITY)
Admission: EM | Admit: 2013-03-16 | Discharge: 2013-03-17 | Disposition: A | Discharge status: Home / Self Care | Payer: BC Managed Care – PPO | Attending: Emergency Medicine | Admitting: Emergency Medicine

## 2013-03-16 DIAGNOSIS — Z79899 Other long term (current) drug therapy: Secondary | ICD-10-CM | POA: Insufficient documentation

## 2013-03-16 DIAGNOSIS — Z992 Dependence on renal dialysis: Secondary | ICD-10-CM | POA: Insufficient documentation

## 2013-03-16 DIAGNOSIS — R011 Cardiac murmur, unspecified: Secondary | ICD-10-CM | POA: Insufficient documentation

## 2013-03-16 DIAGNOSIS — E119 Type 2 diabetes mellitus without complications: Secondary | ICD-10-CM | POA: Insufficient documentation

## 2013-03-16 DIAGNOSIS — N186 End stage renal disease: Secondary | ICD-10-CM

## 2013-03-16 DIAGNOSIS — R519 Headache, unspecified: Secondary | ICD-10-CM

## 2013-03-16 DIAGNOSIS — Z880 Allergy status to penicillin: Secondary | ICD-10-CM | POA: Insufficient documentation

## 2013-03-16 DIAGNOSIS — R51 Headache: Secondary | ICD-10-CM | POA: Insufficient documentation

## 2013-03-16 DIAGNOSIS — Z9889 Other specified postprocedural states: Secondary | ICD-10-CM | POA: Insufficient documentation

## 2013-03-16 DIAGNOSIS — Z8614 Personal history of Methicillin resistant Staphylococcus aureus infection: Secondary | ICD-10-CM | POA: Insufficient documentation

## 2013-03-16 DIAGNOSIS — R109 Unspecified abdominal pain: Secondary | ICD-10-CM | POA: Insufficient documentation

## 2013-03-16 DIAGNOSIS — I120 Hypertensive chronic kidney disease with stage 5 chronic kidney disease or end stage renal disease: Secondary | ICD-10-CM | POA: Insufficient documentation

## 2013-03-16 DIAGNOSIS — Z872 Personal history of diseases of the skin and subcutaneous tissue: Secondary | ICD-10-CM | POA: Insufficient documentation

## 2013-03-16 DIAGNOSIS — IMO0002 Reserved for concepts with insufficient information to code with codable children: Secondary | ICD-10-CM | POA: Insufficient documentation

## 2013-03-16 DIAGNOSIS — R112 Nausea with vomiting, unspecified: Secondary | ICD-10-CM

## 2013-03-16 LAB — BASIC METABOLIC PANEL
BUN: 31 mg/dL — ABNORMAL HIGH (ref 6–23)
Creatinine, Ser: 8.67 mg/dL — ABNORMAL HIGH (ref 0.50–1.35)
GFR calc Af Amer: 7 mL/min — ABNORMAL LOW (ref >90)
GFR calc non Af Amer: 6 mL/min — ABNORMAL LOW (ref >90)
Glucose, Bld: 186 mg/dL — ABNORMAL HIGH (ref 70–99)

## 2013-03-16 LAB — HEPATIC FUNCTION PANEL
ALT: 16 U/L (ref 0–53)
Albumin: 4.2 g/dL (ref 3.5–5.2)
Alkaline Phosphatase: 102 U/L (ref 39–117)
Total Protein: 8.9 g/dL — ABNORMAL HIGH (ref 6.0–8.3)

## 2013-03-16 LAB — CBC WITH DIFFERENTIAL/PLATELET
Basophils Relative: 0 % (ref 0–1)
Eosinophils Absolute: 0 10*3/uL (ref 0.0–0.7)
HCT: 38.1 % — ABNORMAL LOW (ref 39.0–52.0)
Hemoglobin: 12.2 g/dL — ABNORMAL LOW (ref 13.0–17.0)
MCH: 28.5 pg (ref 26.0–34.0)
MCHC: 32 g/dL (ref 30.0–36.0)
MCV: 89 fL (ref 78.0–100.0)
Monocytes Absolute: 0.2 10*3/uL (ref 0.1–1.0)
Monocytes Relative: 4 % (ref 3–12)

## 2013-03-16 MED ORDER — MORPHINE SULFATE 4 MG/ML IJ SOLN
2.0000 mg | Freq: Once | INTRAMUSCULAR | Status: AC
  Administered 2013-03-16: 2 mg via INTRAVENOUS
  Filled 2013-03-16: qty 1

## 2013-03-16 MED ORDER — METOCLOPRAMIDE HCL 5 MG/ML IJ SOLN
10.0000 mg | Freq: Once | INTRAMUSCULAR | Status: AC
  Administered 2013-03-16: 10 mg via INTRAVENOUS
  Filled 2013-03-16: qty 2

## 2013-03-16 MED ORDER — HYDROCODONE-ACETAMINOPHEN 5-325 MG PO TABS: 1.0000 | ORAL_TABLET | ORAL | Status: DC | PRN

## 2013-03-16 MED ORDER — ONDANSETRON HCL 4 MG PO TABS: 4.0000 mg | ORAL_TABLET | Freq: Four times a day (QID) | ORAL | Status: DC

## 2013-03-16 NOTE — ED Notes (Signed)
Iv team at bedside  

## 2013-03-16 NOTE — ED Notes (Signed)
Pt here for c/o n/v/d x24 hrs

## 2013-03-16 NOTE — ED Notes (Signed)
IV attempted unsuccessful iv team called.

## 2013-03-16 NOTE — ED Notes (Signed)
Pt states pain and nausea are better  Ginger ale and crackers given per pt's request and EDP permission

## 2013-03-16 NOTE — ED Provider Notes (Signed)
History     CSN: [id number]  Arrival date & time 03/16/13  [id number]   First MD Initiated Contact with Patient 03/16/13 1839      Chief Complaint  Patient presents with  . Nausea  . Emesis  . Diarrhea    (Consider location/radiation/quality/duration/timing/severity/associated sxs/prior treatment) HPI  Patient is a 47 year old male past medical history significant for ESRD currently on dialysis Monday Wednesday, Friday presented to the emergency department for gradual onset of headache with nausea and non-bloody non-bilious vomiting that started while he was undergoing dialysis near the end of the appointment. Describes headache as a band of pressure around his forehead with gradual increase in pain up to 10 of 10. Has associated abdominal pain worse after episode of vomiting or dry heaving. Pt has been unable to tolerate PO d/t nausea and vomiting. No alleviating factors. Laying down aggravates nausea.  Patient receives his dialysis at [facility]. Patient states he did not go to his dialysis appointments last week and this Monday because he was out of town. Patient makes little to no urine. His nephrologist is Denies fevers, chill, diarrhea, sick contacts.    Past Medical History  Diagnosis Date  . Chills     at night - sometimes  . Fatigue     loss of fatigue  . MRSA (methicillin resistant staph aureus) culture positive   . Poor circulation   . Renal disorder   . Complication of anesthesia   . PONV (postoperative nausea and vomiting)   . Heart murmur     years ago  . History of blood transfusion   . Eczema   . Headache(784.0)     Years ago  . Hypertension     sees Dr. Jaci Standard  . Diabetes mellitus     controlled by diet    Past Surgical History  Procedure Laterality Date  . Av fistula placement  06-18-11    Right brachiocephalic AVF  . Soft tissue mass excision      Right arm, Left leg  for MRSA infection  . Eye surgery      left eye for Laser,  diabetic retinopathy  . Hematoma evacuation Right Feb. 25, 2014  . Revison of arteriovenous fistula Right 12/14/2012    Procedure: REVISON OF upper arm ARTERIOVENOUS FISTULA using 12mmx10cm gortex graft;  Surgeon: Conrad Kilmichael, MD;  Location: [facility] [facility];  Service: Vascular;  Laterality: Right;  . Insertion of dialysis catheter  12/14/2012    Procedure: INSERTION OF DIALYSIS CATHETER;  Surgeon: Conrad Livingston, MD;  Location: [place];  Service: Vascular;;  . Hematoma evacuation Right 12/14/2012    Procedure: EVACUATION HEMATOMA;  Surgeon: Conrad Purdin, MD;  Location: [place];  Service: Vascular;  Laterality: Right;    Family History  Problem Relation Age of [place]  . Diabetes Mother     History  Substance Use Topics  . Smoking status: Never Smoker   . Smokeless tobacco: Never Used  . Alcohol Use: No      Review of Systems  Constitutional: Negative for fever and chills.  HENT: Negative for neck pain and neck stiffness.   Eyes: Negative for photophobia, pain and visual disturbance.  Respiratory: Negative for shortness of breath.   Cardiovascular: Negative for chest pain.  Gastrointestinal: Positive for nausea, vomiting and abdominal pain. Negative for diarrhea and blood in stool.  Genitourinary: Negative.   Musculoskeletal: Negative for back pain.  Skin: Negative.   Neurological: Positive for headaches. Negative for dizziness,  syncope, weakness and numbness.    Allergies  Amoxicillin and Percocet  Home Medications   Current Outpatient Rx  Name  Route  Sig  Dispense  Refill  . amLODipine (NORVASC) 10 MG tablet   Oral   Take 10 mg by mouth at bedtime.          . calcium acetate (PHOSLO) 667 MG capsule   Oral   Take 667 mg by mouth 3 (three) times daily with meals.         . fluticasone (FLONASE) 50 MCG/ACT nasal spray   Nasal   Place 2 sprays into the nose daily.   16 g   6   . HYDROcodone-acetaminophen (NORCO/VICODIN) 5-325 MG per tablet   Oral   Take 1 tablet by mouth  every 4 (four) hours as needed for pain.   6 tablet   0   . ondansetron (ZOFRAN) 4 MG tablet   Oral   Take 1 tablet (4 mg total) by mouth every 6 (six) hours.   12 tablet   0     BP 135/80  Pulse 94  Temp(Src) 98.4 F (36.9 C) (Oral)  Resp 18  SpO2 100%  Physical Exam  Constitutional: He is oriented to person, place, and time. He appears well-developed and well-nourished.  HENT:  Head: Normocephalic and atraumatic.  Mouth/Throat: Oropharynx is clear and moist.  Eyes: Conjunctivae and EOM are normal. Pupils are equal, round, and reactive to light.  Neck: Neck supple.  Cardiovascular: Normal rate, regular rhythm, normal heart sounds and intact distal pulses.   Pulmonary/Chest: Effort normal and breath sounds normal. No respiratory distress.  Abdominal: Soft. Bowel sounds are normal.  Musculoskeletal: He exhibits no edema.  Neurological: He is alert and oriented to person, place, and time. He has normal strength. No cranial nerve deficit or sensory deficit. Coordination and gait normal.  Skin: Skin is warm and dry.  Psychiatric: He has a normal mood and affect.    ED Course  Procedures (including critical care time)  Medications  metoCLOPramide (REGLAN) injection 10 mg (10 mg Intravenous Given 03/16/13 1932)  morphine 4 MG/ML injection 2 mg (2 mg Intravenous Given 03/16/13 1929)  morphine 4 MG/ML injection 2 mg (2 mg Intravenous Given 03/16/13 2207)  ondansetron (ZOFRAN) injection 4 mg (4 mg Intravenous Given 03/17/13 0014)     Labs Reviewed  CBC WITH DIFFERENTIAL - Abnormal; Notable for the following:    Hemoglobin 12.2 (*)    HCT 38.1 (*)    RDW 16.0 (*)    Neutrophils Relative % 81 (*)    All other components within normal limits  BASIC METABOLIC PANEL - Abnormal; Notable for the following:    Potassium 6.1 (*)    Chloride 91 (*)    Glucose, Bld 186 (*)    BUN 31 (*)    Creatinine, Ser 8.67 (*)    GFR calc non Af Amer 6 (*)    GFR calc Af Amer 7 (*)    All  other components within normal limits  HEPATIC FUNCTION PANEL - Abnormal; Notable for the following:    Total Protein 8.9 (*)    AST 38 (*)    All other components within normal limits  LIPASE, BLOOD  POTASSIUM    Initial potassium elevated d/t hemolysis, repeat potassium wnl.   No results found.   1. Nausea & vomiting   2. Headache   3. ESRD (end stage renal disease) on dialysis  MDM  Pt HA and nausea treated and improved while in ED.  Presentation is like previous HAs for patient and non concerning for [facility], ICH, Meningitis, or temporal arteritis. Pt is afebrile with no focal neuro deficits, nuchal rigidity, or change in vision. Abdomen S/NT/ND with bowel sounds present. No concern for acute abdomen. VSS. Labs reviewed. Pt is advised to follow up with PCP. Pt verbalizes understanding and is agreeable with plan to dc. Patient d/w with Dr. Alvino Chapel, agrees with plan. Patient is stable at time of discharge.           [place], PA-C 03/17/13 0127

## 2013-03-17 MED ORDER — ONDANSETRON HCL 4 MG/2ML IJ SOLN
4.0000 mg | Freq: Once | INTRAMUSCULAR | Status: AC
  Administered 2013-03-17: 4 mg via INTRAVENOUS
  Filled 2013-03-17: qty 2

## 2013-03-18 NOTE — ED Provider Notes (Signed)
Medical screening examination/treatment/procedure(s) were performed by non-physician practitioner and as supervising physician I was immediately available for consultation/collaboration.  Jasper Riling. Alvino Chapel, MD 03/18/13 [phone]

## 2013-08-23 ENCOUNTER — Ambulatory Visit (INDEPENDENT_AMBULATORY_CARE_PROVIDER_SITE_OTHER): Payer: Medicare Other | Admitting: Cardiology

## 2013-08-23 ENCOUNTER — Encounter: Payer: Self-pay | Admitting: Cardiology

## 2013-08-23 VITALS — BP 140/84 | HR 107 | Ht 71.0 in | Wt 238.6 lb

## 2013-08-23 DIAGNOSIS — E669 Obesity, unspecified: Secondary | ICD-10-CM | POA: Insufficient documentation

## 2013-08-23 DIAGNOSIS — Z992 Dependence on renal dialysis: Secondary | ICD-10-CM

## 2013-08-23 DIAGNOSIS — R079 Chest pain, unspecified: Secondary | ICD-10-CM | POA: Insufficient documentation

## 2013-08-23 DIAGNOSIS — E119 Type 2 diabetes mellitus without complications: Secondary | ICD-10-CM | POA: Insufficient documentation

## 2013-08-23 DIAGNOSIS — E1129 Type 2 diabetes mellitus with other diabetic kidney complication: Secondary | ICD-10-CM

## 2013-08-23 DIAGNOSIS — N185 Chronic kidney disease, stage 5: Secondary | ICD-10-CM | POA: Insufficient documentation

## 2013-08-23 HISTORY — DX: Type 2 diabetes mellitus without complications: E11.9

## 2013-08-23 HISTORY — DX: Obesity, unspecified: E66.9

## 2013-08-23 HISTORY — DX: Chest pain, unspecified: R07.9

## 2013-08-23 NOTE — Patient Instructions (Addendum)
Your physician has requested that you have a lexiscan myoview. For further information please visit [email or web address]. Please follow instruction sheet, as given.  Follow-up based on procedure results.  Your physician recommends that you continue on your current medications as directed. Please refer to the Current Medication list given to you today.

## 2013-08-23 NOTE — Progress Notes (Signed)
[name]. [address], [address], [place]  [address] Phone: [phone] Fax:  [phone]  Date:  08/23/2013   ID:  [name], DOB 1966-07-14, MRN [id number]  PCP:  [name] Cower, MD   History of Present Illness: [name] is a 47 y.o. male here for further evaluation of chest pain. Has comorbidities of end-stage renal disease on dialysis secondary to diabetes and hypertension followed by Dr. Justin [name]. Also has anemia of chronic disease, secondary hyperparathyroidism and hyperlipidemia.  When walking in cold, chest seems to hurt. Trying to loose 30 pounds he states. Trying to get renal transplant. When straining can have a moderate chest tightness. No SOB, no palpiations, no syncope.   Mother - DM Father - ? Died  Non smoker. +DM now diet control.   Wt Readings from Last 3 Encounters:  08/23/13 238 lb 9.6 oz (108.228 kg)  02/15/13 230 lb (104.327 kg)  01/09/13 231 lb (104.781 kg)     Past Medical History  Diagnosis Date  . Chills     at night - sometimes  . Fatigue     loss of fatigue  . MRSA (methicillin resistant staph aureus) culture positive   . Poor circulation   . Renal disorder   . Complication of anesthesia   . PONV (postoperative nausea and vomiting)   . Heart murmur     years ago  . History of blood transfusion   . Eczema   . Headache(784.0)     Years ago  . Hypertension     sees Dr. Jaci [name]  . Diabetes mellitus     controlled by diet    Past Surgical History  Procedure Laterality Date  . Av fistula placement  06-18-11    Right brachiocephalic AVF  . Soft tissue mass excision      Right arm, Left leg  for MRSA infection  . Eye surgery      left eye for Laser, diabetic retinopathy  . Hematoma evacuation Right Feb. 25, 2014  . Revison of arteriovenous fistula Right 12/14/2012    Procedure: REVISON OF upper arm ARTERIOVENOUS FISTULA using 68mmx10cm gortex graft;  Surgeon: [name] Camp [place], MD;  Location: [facility];  Service: Vascular;  Laterality:  Right;  . Insertion of dialysis catheter  12/14/2012    Procedure: INSERTION OF DIALYSIS CATHETER;  Surgeon: [name] [place], MD;  Location: [place];  Service: Vascular;;  . Hematoma evacuation Right 12/14/2012    Procedure: EVACUATION HEMATOMA;  Surgeon: [name] [place], MD;  Location: [place];  Service: Vascular;  Laterality: Right;    Current Outpatient Prescriptions  Medication Sig Dispense Refill  . amLODipine (NORVASC) 10 MG tablet Take 10 mg by mouth at bedtime.       . sevelamer carbonate (RENVELA) 800 MG tablet Take 800 mg by mouth 3 (three) times daily with meals.       No current facility-administered medications for this visit.    Allergies:    Allergies  Allergen Reactions  . Amoxicillin Itching  . Percocet [Oxycodone-Acetaminophen] Itching and Other (See Comments)    Legs only. Itching and burning feeling.  Marland Kitchen Hydromorphone Itching    Patient states he may take with benadryl. Makes feet itch.  . Oxycodone-Acetaminophen Rash    Social History:  The patient  reports that he has never smoked. He has never used smokeless tobacco. He reports that he does not drink alcohol or use illicit drugs.   Family History  Problem Relation Age of [place]  . Diabetes Mother     ROS:  Please see the history of present illness.   Denies any strokelike symptoms, no fevers, no orthopnea, no PND, no rashes, no edema.   All other systems reviewed and negative.   PHYSICAL EXAM: VS:  BP 140/84  Pulse 107  Ht 5\' 11"  (1.803 m)  Wt 238 lb 9.6 oz (108.228 kg)  BMI 33.29 kg/m2 Well nourished, well developed, in no acute distress HEENT: normal, [name]/AT, EOMI Neck: no JVD, normal carotid upstroke, no bruit Cardiac:  normal S1, S2; RRR; no murmur Lungs:  clear to auscultation bilaterally, no wheezing, rhonchi or rales Abd: soft, nontender, no hepatomegaly, no bruits Ext: no edema, 2+ distal pulses, shunt intact Skin: warm and dry GU: deferred Neuro: no focal abnormalities noted, AAO x 3  EKG:  Sinus  tachycardia 107, nonspecific T-wave changes with T-wave inversion in the 2, 3, aVF as well as V5, V6. Prior medical records reviewed.     Hemoglobin 10.3, platelets 246, HDL 35, triglycerides 643  ASSESSMENT AND PLAN:  1. Chest pain-possible angina-with T-wave inversion on EKG and risk factors such as diet-controlled diabetes, hypertension, end-stage renal disease on hemodialysis, I will proceed with nuclear stress test for further risk stratification, detection of possible ischemia. He will  need this regardless for possible renal transplant in the future. 2. End-stage renal disease on hemodialysis-likely secondary from diabetes/hypertension. Per nephrology 3. Hypertension-he states that his blood pressures are quite labile and during hemodialysis they can drop quite significantly. No further agents added today. 4. Abnormal EKG/tachycardia-if stress test becomes high-risk, consider beta blocker. 5. Obesity-encourage weight loss. This will be helpful for him to see what his stress test shows a that he can safely go forward with exercise. 6. Hypertriglyceridemia-low fat diet, exercise, consider fish oil. With triglycerides greater than 500, at risk for pancreatitis.  Signed, Candee Furbish, MD [facility]  08/23/2013 10:17 AM

## 2013-09-08 ENCOUNTER — Ambulatory Visit (HOSPITAL_COMMUNITY): Payer: BC Managed Care – PPO | Attending: Cardiology | Admitting: Radiology

## 2013-09-08 VITALS — BP 146/93 | HR 82 | Ht 71.0 in | Wt 244.0 lb

## 2013-09-08 DIAGNOSIS — R9431 Abnormal electrocardiogram [ECG] [EKG]: Secondary | ICD-10-CM | POA: Insufficient documentation

## 2013-09-08 DIAGNOSIS — R079 Chest pain, unspecified: Secondary | ICD-10-CM | POA: Insufficient documentation

## 2013-09-08 DIAGNOSIS — E119 Type 2 diabetes mellitus without complications: Secondary | ICD-10-CM | POA: Insufficient documentation

## 2013-09-08 DIAGNOSIS — Z01810 Encounter for preprocedural cardiovascular examination: Secondary | ICD-10-CM | POA: Insufficient documentation

## 2013-09-08 DIAGNOSIS — E785 Hyperlipidemia, unspecified: Secondary | ICD-10-CM | POA: Insufficient documentation

## 2013-09-08 DIAGNOSIS — I10 Essential (primary) hypertension: Secondary | ICD-10-CM | POA: Insufficient documentation

## 2013-09-08 MED ORDER — TECHNETIUM TC 99M SESTAMIBI GENERIC - CARDIOLITE
11.0000 | Freq: Once | INTRAVENOUS | Status: AC | PRN
  Administered 2013-09-08: 11 via INTRAVENOUS

## 2013-09-08 MED ORDER — REGADENOSON 0.4 MG/5ML IV SOLN
0.4000 mg | Freq: Once | INTRAVENOUS | Status: AC
  Administered 2013-09-08: 0.4 mg via INTRAVENOUS

## 2013-09-08 MED ORDER — TECHNETIUM TC 99M SESTAMIBI GENERIC - CARDIOLITE
33.0000 | Freq: Once | INTRAVENOUS | Status: AC | PRN
  Administered 2013-09-08: 33 via INTRAVENOUS

## 2013-09-08 NOTE — Progress Notes (Signed)
[facility] [address] [place], [place] [address] [phone]    Cardiology Nuclear Med Study  [name] is a 47 y.o. male     MRN : [id number]     DOB: [birth year]  Procedure Date: 09/08/2013  Nuclear Med Background Indication for Stress Test:  Evaluation for Ischemia, Pending Surgical Clearance for Renal Transplant,and Abnormal EKG History:  No prior known history of CAD Cardiac Risk Factors: Hypertension, lipids, and NIDDM  Symptoms:  Chest Pain with/without exertion (last occurrence was last week)   Nuclear Pre-Procedure Caffeine/Decaff Intake:  None 12 hours NPO After: 7:00pm   Lungs:  clear O2 Sat: 94% on room air. IV 0.9% NS with Angio Cath:  22g  IV Site: L Antecubital  IV Started by:  Jennelle Human, CNMT  Chest Size (in):  46 Cup Size: n/a  Height: 5\' 11"  (1.803 m)  Weight:  244 lb (110.678 kg)  BMI:  Body mass index is 34.05 kg/(m^2). Tech Comments:  Norvasc last night    Nuclear Med Study 1 or 2 day study: 1 day  Stress Test Type:  Lexiscan  Reading MD: Candee Furbish, MD  Order Authorizing Provider:Devyne Hauger Marlou Porch, MD  Resting Radionuclide: Technetium 37m Sestamibi  Resting Radionuclide Dose: 11 mCi   Stress Radionuclide:  Technetium 91m Sestamibi  Stress Radionuclide Dose: 33.0 mCi           Stress Protocol Rest HR: 82 Stress HR: 103  Rest BP: 146/93 Stress BP: 137/71  Exercise Time (min): n/a METS: n/a   Predicted Max HR: 173 bpm % Max HR: 59.54 bpm Rate Pressure Product: 14111   Dose of Adenosine (mg):  n/a Dose of Lexiscan: 0.4 mg  Dose of Atropine (mg): n/a Dose of Dobutamine: n/a mcg/kg/min (at max HR)  Stress Test Technologist: Irven Baltimore, RN  Nuclear Technologist:  Charlton Amor, CNMT     Rest Procedure:  Myocardial perfusion imaging was performed at rest 45 minutes following the intravenous administration of Technetium 75m Sestamibi. Rest ECG: NSR - Normal EKG  Stress Procedure:  The patient received IV  Lexiscan 0.4 mg over 15-seconds.  Technetium 71m Sestamibi injected at 30-seconds.  The patient complained of chest tightness, and headache with Lexiscan. Quantitative spect images were obtained after a 45 minute delay. Stress ECG: Non specific T wave flattening.   QPS Raw Data Images:  Mild diaphragmatic attenuation.  Normal left ventricular size. Stress Images:  There is mild decrease uptake in inferior wall. (Diaphragmatic) Rest Images:  There is decreased uptake in the inferior wall. Subtraction (SDS):  Normal Transient Ischemic Dilatation (Normal <1.22):  0.98 Lung/Heart Ratio (Normal <0.45):  0.35  Quantitative Gated Spect Images QGS EDV:  139 ml QGS ESV:  59 ml  Impression Exercise Capacity:  Lexiscan with no exercise. BP Response:  Normal blood pressure response. Clinical Symptoms:  Typical with Lexiscan ECG Impression:  No significant ST segment change suggestive of ischemia. Comparison with Prior Nuclear Study: No previous nuclear study performed  Overall Impression:  Low risk stress nuclear study with inferior wall changes consistent with diaphragmatic attenuation. .  LV Ejection Fraction: 57%.  LV Wall Motion:  NL LV Function; NL Wall Motion

## 2013-10-16 ENCOUNTER — Encounter (HOSPITAL_COMMUNITY): Payer: Self-pay | Admitting: Emergency Medicine

## 2013-10-16 ENCOUNTER — Emergency Department (HOSPITAL_COMMUNITY): Payer: BC Managed Care – PPO

## 2013-10-16 ENCOUNTER — Emergency Department (HOSPITAL_COMMUNITY)
Admission: EM | Admit: 2013-10-16 | Discharge: 2013-10-16 | Disposition: A | Discharge status: Home / Self Care | Payer: BC Managed Care – PPO | Attending: Emergency Medicine | Admitting: Emergency Medicine

## 2013-10-16 DIAGNOSIS — N186 End stage renal disease: Secondary | ICD-10-CM | POA: Insufficient documentation

## 2013-10-16 DIAGNOSIS — E1129 Type 2 diabetes mellitus with other diabetic kidney complication: Secondary | ICD-10-CM | POA: Insufficient documentation

## 2013-10-16 DIAGNOSIS — Z992 Dependence on renal dialysis: Secondary | ICD-10-CM | POA: Insufficient documentation

## 2013-10-16 DIAGNOSIS — Z8614 Personal history of Methicillin resistant Staphylococcus aureus infection: Secondary | ICD-10-CM | POA: Insufficient documentation

## 2013-10-16 DIAGNOSIS — R198 Other specified symptoms and signs involving the digestive system and abdomen: Secondary | ICD-10-CM | POA: Insufficient documentation

## 2013-10-16 DIAGNOSIS — R197 Diarrhea, unspecified: Secondary | ICD-10-CM | POA: Insufficient documentation

## 2013-10-16 DIAGNOSIS — R112 Nausea with vomiting, unspecified: Secondary | ICD-10-CM | POA: Insufficient documentation

## 2013-10-16 DIAGNOSIS — R195 Other fecal abnormalities: Secondary | ICD-10-CM

## 2013-10-16 DIAGNOSIS — R109 Unspecified abdominal pain: Secondary | ICD-10-CM

## 2013-10-16 DIAGNOSIS — Z79899 Other long term (current) drug therapy: Secondary | ICD-10-CM | POA: Insufficient documentation

## 2013-10-16 DIAGNOSIS — Z880 Allergy status to penicillin: Secondary | ICD-10-CM | POA: Insufficient documentation

## 2013-10-16 DIAGNOSIS — R011 Cardiac murmur, unspecified: Secondary | ICD-10-CM | POA: Insufficient documentation

## 2013-10-16 DIAGNOSIS — R51 Headache: Secondary | ICD-10-CM | POA: Insufficient documentation

## 2013-10-16 DIAGNOSIS — I120 Hypertensive chronic kidney disease with stage 5 chronic kidney disease or end stage renal disease: Secondary | ICD-10-CM | POA: Insufficient documentation

## 2013-10-16 DIAGNOSIS — Z872 Personal history of diseases of the skin and subcutaneous tissue: Secondary | ICD-10-CM | POA: Insufficient documentation

## 2013-10-16 DIAGNOSIS — R1011 Right upper quadrant pain: Secondary | ICD-10-CM | POA: Insufficient documentation

## 2013-10-16 LAB — CBC WITH DIFFERENTIAL/PLATELET
Eosinophils Absolute: 0 10*3/uL (ref 0.0–0.7)
Eosinophils Relative: 0 % (ref 0–5)
Hemoglobin: 11.8 g/dL — ABNORMAL LOW (ref 13.0–17.0)
Lymphs Abs: 0.4 10*3/uL — ABNORMAL LOW (ref 0.7–4.0)
MCH: 28.7 pg (ref 26.0–34.0)
MCV: 88.6 fL (ref 78.0–100.0)
Monocytes Relative: 6 % (ref 3–12)
RBC: 4.11 MIL/uL — ABNORMAL LOW (ref 4.22–5.81)

## 2013-10-16 LAB — COMPREHENSIVE METABOLIC PANEL
ALT: 12 U/L (ref 0–53)
CO2: 22 mEq/L (ref 19–32)
Calcium: 8.2 mg/dL — ABNORMAL LOW (ref 8.4–10.5)
Creatinine, Ser: 10.51 mg/dL — ABNORMAL HIGH (ref 0.50–1.35)
GFR calc Af Amer: 6 mL/min — ABNORMAL LOW (ref >90)
GFR calc non Af Amer: 5 mL/min — ABNORMAL LOW (ref >90)
Glucose, Bld: 115 mg/dL — ABNORMAL HIGH (ref 70–99)

## 2013-10-16 MED ORDER — PROMETHAZINE HCL 25 MG PO TABS: 25.0000 mg | ORAL_TABLET | Freq: Four times a day (QID) | ORAL | Status: DC | PRN

## 2013-10-16 MED ORDER — DIPHENHYDRAMINE HCL 50 MG/ML IJ SOLN
12.5000 mg | Freq: Once | INTRAMUSCULAR | Status: AC
  Administered 2013-10-16: 12.5 mg via INTRAVENOUS
  Filled 2013-10-16: qty 1

## 2013-10-16 MED ORDER — HYDROMORPHONE HCL PF 1 MG/ML IJ SOLN
1.0000 mg | Freq: Once | INTRAMUSCULAR | Status: AC
  Administered 2013-10-16: 1 mg via INTRAVENOUS
  Filled 2013-10-16: qty 1

## 2013-10-16 MED ORDER — SODIUM CHLORIDE 0.9 % IV BOLUS (SEPSIS)
1000.0000 mL | Freq: Once | INTRAVENOUS | Status: AC
  Administered 2013-10-16: 500 mL via INTRAVENOUS

## 2013-10-16 MED ORDER — HYDROCODONE-ACETAMINOPHEN 5-325 MG PO TABS: 1.0000 | ORAL_TABLET | Freq: Four times a day (QID) | ORAL | Status: DC | PRN

## 2013-10-16 MED ORDER — PROMETHAZINE HCL 25 MG/ML IJ SOLN
25.0000 mg | Freq: Four times a day (QID) | INTRAMUSCULAR | Status: DC | PRN
  Administered 2013-10-16: 25 mg via INTRAVENOUS
  Filled 2013-10-16: qty 1

## 2013-10-16 NOTE — ED Notes (Signed)
Patient transported to Ultrasound 

## 2013-10-16 NOTE — ED Provider Notes (Signed)
CSN: [id number]     Arrival date & time 10/16/13  1117 History   First MD Initiated Contact with Patient 10/16/13 1143     Chief Complaint  Patient presents with  . Nausea  . Emesis   (Consider location/radiation/quality/duration/timing/severity/associated sxs/prior Treatment) HPI This is a 47 year old male with a past medical history of end-stage renal disease on hemodialysis who presents the emergency department chief complaint of nausea vomiting and diarrhea.  She was dialyzed today.  His dry weight is 107 kg.  He was up to 114 today and had a 5 kg of fluid removed.  The patient complains of nausea vomiting diarrhea starting 2 days ago.  He states that his vomiting is intermittent.  He denies any inciting factors such as foods.  No known contacts with similar symptoms, no recent foreign travel or ingestion of suspicious foods.  He states that he has bouts of severe nausea followed by several episodes of vomiting at which time his symptoms resolved for a short period of time.  Patient has been able to hold down ginger ale.  He has intermittent tenesmus.  He has had loose stools that are non-watery, non-bloody, no melena.  Vomitus is non-bilious and nonbloody. He also complains of global aching headache.  He has had similar symptoms at the ED before however he denies a history of loose stools.  The patient did not have any medications at home to treat his nausea.  Patient is tachycardic and hypertensive at triage  Past Medical History  Diagnosis Date  . Chills     at night - sometimes  . Fatigue     loss of fatigue  . MRSA (methicillin resistant staph aureus) culture positive   . Poor circulation   . Renal disorder   . Complication of anesthesia   . PONV (postoperative nausea and vomiting)   . Heart murmur     years ago  . History of blood transfusion   . Eczema   . Headache(784.0)     Years ago  . Hypertension     sees Dr. Jaci Standard  . Diabetes mellitus     controlled by diet  .  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 Av fistula placement  06-18-11    Right brachiocephalic AVF  . Soft tissue mass excision      Right arm, Left leg  for MRSA infection  . Eye surgery      left eye for Laser, diabetic retinopathy  . Hematoma evacuation Right Feb. 25, 2014  . Revison of arteriovenous fistula Right 12/14/2012    Procedure: REVISON OF upper arm ARTERIOVENOUS FISTULA using 39mmx10cm gortex graft;  Surgeon: Conrad Hatt[place]burg, MD;  Location: [place];  Service: Vascular;  Laterality: Right;  . Insertion of dialysis catheter  12/14/2012    Procedure: INSERTION OF DIALYSIS CATHETER;  Surgeon: Conrad Whitewater, MD;  Location: [place];  Service: Vascular;;  . Hematoma evacuation Right 12/14/2012    Procedure: EVACUATION HEMATOMA;  Surgeon: Conrad Grayson, MD;  Location: [place];  Service: Vascular;  Laterality: Right;   Family History  Problem Relation Age of Onset  . Diabetes Mother    History  Substance Use Topics  . Smoking status: Never Smoker   . Smokeless tobacco: Never Used  . Alcohol Use: No    Review of Systems Ten systems reviewed and are negative for acute change, except as noted in the HPI.   Allergies  Amoxicillin; Percocet; Hydromorphone; and Oxycodone-acetaminophen  Home Medications   Current Outpatient Rx  Name  Route  Sig  Dispense  Refill  . amLODipine (NORVASC) 10 MG tablet   Oral   Take 10 mg by mouth at bedtime.          . sevelamer carbonate (RENVELA) 800 MG tablet   Oral   Take 800 mg by mouth 3 (three) times daily with meals.          BP 179/86  Pulse 111  Temp(Src) 98 F (36.7 C) (Oral)  Resp 16  Ht 5\' 11"  (1.803 m)  Wt 240 lb (108.863 kg)  BMI 33.49 kg/m2  SpO2 100% Physical Exam Physical Exam  Nursing note and vitals reviewed. Constitutional: He appears well-developed and well-nourished. No distress.  HENT:  Head: Normocephalic and atraumatic.  Eyes: Conjunctivae normal  are normal. No scleral icterus.  Neck: Normal range of motion. Neck supple.  Cardiovascular: Normal rate, regular rhythm and normal heart sounds.   AV graft and right forearm with palpable thrill. Pulmonary/Chest: Effort normal and breath sounds normal. No respiratory distress.  Abdominal: Soft. tender to palpation right upper quadrant.  Negative Murphy's sign.  No guarding, rigidity, or peritoneal signs.   Musculoskeletal: He exhibits no edema.  Neurological: He is alert.  Skin: Skin is warm and dry. He is not diaphoretic.  Psychiatric: His behavior is normal.    ED Course  Procedures (including critical care time) Labs Review Labs Reviewed  CBC WITH DIFFERENTIAL  LIPASE, BLOOD  COMPREHENSIVE METABOLIC PANEL   Imaging Review No results found.  EKG Interpretation   None       MDM   1. Abdominal pain   2. Nausea and vomiting   3. Passage of loose stools    Patient here with complaint of nausea, vomiting, loose stools, headache.  He is tachycardic.  Feel he is mildly dehydrated.  She was not dialyzed to dry weight today due to his dehydration.  Patient also has right upper quadrant abdominal pain, nausea and vomiting.  No history of abdominal surgeries.  Will obtain ultrasound abdomen to rule out biliary colic or cholelithiasis.   2:21 PM Patient [place] positive for gallstones. Patient's wife states that he did eat fried fish, chocolate lasagna and whipped cream prior to this incident. I feel his symptoms are consistent with biliary colic. Patient c/o sever RUQ pain currently.   4:20 PM Filed Vitals:   10/16/13 1230 10/16/13 1458 10/16/13 1600 10/16/13 1615  BP: 163/89 159/96 179/96 175/104  Pulse: 103 110 103 102  Temp:      TempSrc:      Resp: 29 22 29 31   Height:      Weight:      SpO2: 99% 95% 96% 97%   Feel the patient sxs consistent with biliary colic. HR consistently above 100, but coming down after pain meds. Will d/c with pain meds/antiemetics and f/u with  [facility]. Patient is nontoxic, nonseptic appearing, in no apparent distress.  Patient's pain and other symptoms adequately managed in emergency department.  Fluid bolus given.  Labs, imaging and vitals reviewed.  Patient does not meet the SIRS or Sepsis criteria.  On repeat exam patient does not have a surgical abdomin and there are nor peritoneal signs.  No indication of appendicitis, bowel obstruction, bowel perforation, cholecystitis, diverticulitis,  Patient discharged home with symptomatic treatment and given strict instructions for follow-up with their primary care physician.  I have also discussed reasons to return immediately  to the ER.  Patient expresses understanding and agrees with plan.     Margarita Mail, PA-C 10/16/13 1628

## 2013-10-16 NOTE — ED Notes (Signed)
Pt reports n/v/d and headache x 2 days with chills. Denies body pain, sore throat, or cough. Dialysis pt, last tx was this am.

## 2013-10-19 NOTE — ED Provider Notes (Signed)
Medical screening examination/treatment/procedure(s) were performed by non-physician practitioner and as supervising physician I was immediately available for consultation/collaboration.  EKG Interpretation   None         Alfonzo Feller, DO 10/19/13 1235

## 2014-02-17 DIAGNOSIS — D631 Anemia in chronic kidney disease: Secondary | ICD-10-CM | POA: Diagnosis not present

## 2014-02-17 DIAGNOSIS — D509 Iron deficiency anemia, unspecified: Secondary | ICD-10-CM | POA: Diagnosis not present

## 2014-02-17 DIAGNOSIS — N2581 Secondary hyperparathyroidism of renal origin: Secondary | ICD-10-CM | POA: Diagnosis not present

## 2014-02-17 DIAGNOSIS — E1129 Type 2 diabetes mellitus with other diabetic kidney complication: Secondary | ICD-10-CM | POA: Diagnosis not present

## 2014-02-17 DIAGNOSIS — N186 End stage renal disease: Secondary | ICD-10-CM | POA: Diagnosis not present

## 2014-03-19 DIAGNOSIS — N186 End stage renal disease: Secondary | ICD-10-CM | POA: Diagnosis not present

## 2014-03-20 DIAGNOSIS — N2581 Secondary hyperparathyroidism of renal origin: Secondary | ICD-10-CM | POA: Diagnosis not present

## 2014-03-20 DIAGNOSIS — E1129 Type 2 diabetes mellitus with other diabetic kidney complication: Secondary | ICD-10-CM | POA: Diagnosis not present

## 2014-03-20 DIAGNOSIS — N186 End stage renal disease: Secondary | ICD-10-CM | POA: Diagnosis not present

## 2014-03-20 DIAGNOSIS — D509 Iron deficiency anemia, unspecified: Secondary | ICD-10-CM | POA: Diagnosis not present

## 2014-04-18 DIAGNOSIS — N186 End stage renal disease: Secondary | ICD-10-CM | POA: Diagnosis not present

## 2014-04-19 DIAGNOSIS — Z23 Encounter for immunization: Secondary | ICD-10-CM | POA: Insufficient documentation

## 2014-04-19 DIAGNOSIS — N2581 Secondary hyperparathyroidism of renal origin: Secondary | ICD-10-CM | POA: Diagnosis not present

## 2014-04-19 DIAGNOSIS — D631 Anemia in chronic kidney disease: Secondary | ICD-10-CM | POA: Diagnosis not present

## 2014-04-19 DIAGNOSIS — N186 End stage renal disease: Secondary | ICD-10-CM | POA: Diagnosis not present

## 2014-04-19 DIAGNOSIS — D509 Iron deficiency anemia, unspecified: Secondary | ICD-10-CM | POA: Diagnosis not present

## 2014-04-19 DIAGNOSIS — Z01810 Encounter for preprocedural cardiovascular examination: Secondary | ICD-10-CM | POA: Insufficient documentation

## 2014-04-25 DIAGNOSIS — D509 Iron deficiency anemia, unspecified: Secondary | ICD-10-CM | POA: Diagnosis not present

## 2014-04-25 DIAGNOSIS — N2581 Secondary hyperparathyroidism of renal origin: Secondary | ICD-10-CM | POA: Diagnosis not present

## 2014-04-25 DIAGNOSIS — N186 End stage renal disease: Secondary | ICD-10-CM | POA: Diagnosis not present

## 2014-04-27 DIAGNOSIS — N2581 Secondary hyperparathyroidism of renal origin: Secondary | ICD-10-CM | POA: Diagnosis not present

## 2014-04-27 DIAGNOSIS — N186 End stage renal disease: Secondary | ICD-10-CM | POA: Diagnosis not present

## 2014-04-27 DIAGNOSIS — D509 Iron deficiency anemia, unspecified: Secondary | ICD-10-CM | POA: Diagnosis not present

## 2014-05-10 DIAGNOSIS — E1129 Type 2 diabetes mellitus with other diabetic kidney complication: Secondary | ICD-10-CM | POA: Diagnosis not present

## 2014-05-19 DIAGNOSIS — N186 End stage renal disease: Secondary | ICD-10-CM | POA: Diagnosis not present

## 2014-05-22 DIAGNOSIS — N039 Chronic nephritic syndrome with unspecified morphologic changes: Secondary | ICD-10-CM | POA: Diagnosis not present

## 2014-05-22 DIAGNOSIS — N2581 Secondary hyperparathyroidism of renal origin: Secondary | ICD-10-CM | POA: Diagnosis not present

## 2014-05-22 DIAGNOSIS — Z23 Encounter for immunization: Secondary | ICD-10-CM | POA: Diagnosis not present

## 2014-05-22 DIAGNOSIS — N186 End stage renal disease: Secondary | ICD-10-CM | POA: Diagnosis not present

## 2014-05-22 DIAGNOSIS — E1129 Type 2 diabetes mellitus with other diabetic kidney complication: Secondary | ICD-10-CM | POA: Diagnosis not present

## 2014-05-22 DIAGNOSIS — D631 Anemia in chronic kidney disease: Secondary | ICD-10-CM | POA: Diagnosis not present

## 2014-05-24 DIAGNOSIS — D631 Anemia in chronic kidney disease: Secondary | ICD-10-CM | POA: Diagnosis not present

## 2014-05-24 DIAGNOSIS — Z23 Encounter for immunization: Secondary | ICD-10-CM | POA: Diagnosis not present

## 2014-05-24 DIAGNOSIS — N186 End stage renal disease: Secondary | ICD-10-CM | POA: Diagnosis not present

## 2014-05-24 DIAGNOSIS — E1129 Type 2 diabetes mellitus with other diabetic kidney complication: Secondary | ICD-10-CM | POA: Diagnosis not present

## 2014-05-24 DIAGNOSIS — N2581 Secondary hyperparathyroidism of renal origin: Secondary | ICD-10-CM | POA: Diagnosis not present

## 2014-05-26 DIAGNOSIS — E1129 Type 2 diabetes mellitus with other diabetic kidney complication: Secondary | ICD-10-CM | POA: Diagnosis not present

## 2014-05-26 DIAGNOSIS — N2581 Secondary hyperparathyroidism of renal origin: Secondary | ICD-10-CM | POA: Diagnosis not present

## 2014-05-26 DIAGNOSIS — Z23 Encounter for immunization: Secondary | ICD-10-CM | POA: Diagnosis not present

## 2014-05-26 DIAGNOSIS — N186 End stage renal disease: Secondary | ICD-10-CM | POA: Diagnosis not present

## 2014-05-26 DIAGNOSIS — D631 Anemia in chronic kidney disease: Secondary | ICD-10-CM | POA: Diagnosis not present

## 2014-05-29 DIAGNOSIS — D631 Anemia in chronic kidney disease: Secondary | ICD-10-CM | POA: Diagnosis not present

## 2014-05-29 DIAGNOSIS — E1129 Type 2 diabetes mellitus with other diabetic kidney complication: Secondary | ICD-10-CM | POA: Diagnosis not present

## 2014-05-29 DIAGNOSIS — N186 End stage renal disease: Secondary | ICD-10-CM | POA: Diagnosis not present

## 2014-05-29 DIAGNOSIS — N039 Chronic nephritic syndrome with unspecified morphologic changes: Secondary | ICD-10-CM | POA: Diagnosis not present

## 2014-05-29 DIAGNOSIS — N2581 Secondary hyperparathyroidism of renal origin: Secondary | ICD-10-CM | POA: Diagnosis not present

## 2014-05-29 DIAGNOSIS — Z23 Encounter for immunization: Secondary | ICD-10-CM | POA: Diagnosis not present

## 2014-05-30 DIAGNOSIS — T82898A Other specified complication of vascular prosthetic devices, implants and grafts, initial encounter: Secondary | ICD-10-CM | POA: Diagnosis not present

## 2014-05-30 DIAGNOSIS — N186 End stage renal disease: Secondary | ICD-10-CM | POA: Diagnosis not present

## 2014-05-30 DIAGNOSIS — Z4802 Encounter for removal of sutures: Secondary | ICD-10-CM | POA: Insufficient documentation

## 2014-05-30 DIAGNOSIS — I871 Compression of vein: Secondary | ICD-10-CM | POA: Diagnosis not present

## 2014-05-31 DIAGNOSIS — N039 Chronic nephritic syndrome with unspecified morphologic changes: Secondary | ICD-10-CM | POA: Diagnosis not present

## 2014-05-31 DIAGNOSIS — Z23 Encounter for immunization: Secondary | ICD-10-CM | POA: Diagnosis not present

## 2014-05-31 DIAGNOSIS — N2581 Secondary hyperparathyroidism of renal origin: Secondary | ICD-10-CM | POA: Diagnosis not present

## 2014-05-31 DIAGNOSIS — N186 End stage renal disease: Secondary | ICD-10-CM | POA: Diagnosis not present

## 2014-05-31 DIAGNOSIS — D631 Anemia in chronic kidney disease: Secondary | ICD-10-CM | POA: Diagnosis not present

## 2014-05-31 DIAGNOSIS — E1129 Type 2 diabetes mellitus with other diabetic kidney complication: Secondary | ICD-10-CM | POA: Diagnosis not present

## 2014-06-02 DIAGNOSIS — D631 Anemia in chronic kidney disease: Secondary | ICD-10-CM | POA: Diagnosis not present

## 2014-06-02 DIAGNOSIS — N186 End stage renal disease: Secondary | ICD-10-CM | POA: Diagnosis not present

## 2014-06-02 DIAGNOSIS — Z23 Encounter for immunization: Secondary | ICD-10-CM | POA: Diagnosis not present

## 2014-06-02 DIAGNOSIS — N2581 Secondary hyperparathyroidism of renal origin: Secondary | ICD-10-CM | POA: Diagnosis not present

## 2014-06-02 DIAGNOSIS — E1129 Type 2 diabetes mellitus with other diabetic kidney complication: Secondary | ICD-10-CM | POA: Diagnosis not present

## 2014-06-05 DIAGNOSIS — N186 End stage renal disease: Secondary | ICD-10-CM | POA: Diagnosis not present

## 2014-06-05 DIAGNOSIS — N2581 Secondary hyperparathyroidism of renal origin: Secondary | ICD-10-CM | POA: Diagnosis not present

## 2014-06-05 DIAGNOSIS — E1129 Type 2 diabetes mellitus with other diabetic kidney complication: Secondary | ICD-10-CM | POA: Diagnosis not present

## 2014-06-05 DIAGNOSIS — Z23 Encounter for immunization: Secondary | ICD-10-CM | POA: Diagnosis not present

## 2014-06-05 DIAGNOSIS — D631 Anemia in chronic kidney disease: Secondary | ICD-10-CM | POA: Diagnosis not present

## 2014-06-07 DIAGNOSIS — N186 End stage renal disease: Secondary | ICD-10-CM | POA: Diagnosis not present

## 2014-06-07 DIAGNOSIS — Z23 Encounter for immunization: Secondary | ICD-10-CM | POA: Diagnosis not present

## 2014-06-07 DIAGNOSIS — N2581 Secondary hyperparathyroidism of renal origin: Secondary | ICD-10-CM | POA: Diagnosis not present

## 2014-06-07 DIAGNOSIS — D631 Anemia in chronic kidney disease: Secondary | ICD-10-CM | POA: Diagnosis not present

## 2014-06-07 DIAGNOSIS — E1129 Type 2 diabetes mellitus with other diabetic kidney complication: Secondary | ICD-10-CM | POA: Diagnosis not present

## 2014-06-09 DIAGNOSIS — N186 End stage renal disease: Secondary | ICD-10-CM | POA: Diagnosis not present

## 2014-06-09 DIAGNOSIS — D631 Anemia in chronic kidney disease: Secondary | ICD-10-CM | POA: Diagnosis not present

## 2014-06-09 DIAGNOSIS — E1129 Type 2 diabetes mellitus with other diabetic kidney complication: Secondary | ICD-10-CM | POA: Diagnosis not present

## 2014-06-09 DIAGNOSIS — Z23 Encounter for immunization: Secondary | ICD-10-CM | POA: Diagnosis not present

## 2014-06-09 DIAGNOSIS — N2581 Secondary hyperparathyroidism of renal origin: Secondary | ICD-10-CM | POA: Diagnosis not present

## 2014-06-12 DIAGNOSIS — N2581 Secondary hyperparathyroidism of renal origin: Secondary | ICD-10-CM | POA: Diagnosis not present

## 2014-06-12 DIAGNOSIS — Z23 Encounter for immunization: Secondary | ICD-10-CM | POA: Diagnosis not present

## 2014-06-12 DIAGNOSIS — E1129 Type 2 diabetes mellitus with other diabetic kidney complication: Secondary | ICD-10-CM | POA: Diagnosis not present

## 2014-06-12 DIAGNOSIS — N186 End stage renal disease: Secondary | ICD-10-CM | POA: Diagnosis not present

## 2014-06-12 DIAGNOSIS — D631 Anemia in chronic kidney disease: Secondary | ICD-10-CM | POA: Diagnosis not present

## 2014-06-14 DIAGNOSIS — N186 End stage renal disease: Secondary | ICD-10-CM | POA: Diagnosis not present

## 2014-06-14 DIAGNOSIS — N2581 Secondary hyperparathyroidism of renal origin: Secondary | ICD-10-CM | POA: Diagnosis not present

## 2014-06-14 DIAGNOSIS — E1129 Type 2 diabetes mellitus with other diabetic kidney complication: Secondary | ICD-10-CM | POA: Diagnosis not present

## 2014-06-14 DIAGNOSIS — Z23 Encounter for immunization: Secondary | ICD-10-CM | POA: Diagnosis not present

## 2014-06-14 DIAGNOSIS — D631 Anemia in chronic kidney disease: Secondary | ICD-10-CM | POA: Diagnosis not present

## 2014-06-16 DIAGNOSIS — N186 End stage renal disease: Secondary | ICD-10-CM | POA: Diagnosis not present

## 2014-06-16 DIAGNOSIS — E1129 Type 2 diabetes mellitus with other diabetic kidney complication: Secondary | ICD-10-CM | POA: Diagnosis not present

## 2014-06-16 DIAGNOSIS — Z23 Encounter for immunization: Secondary | ICD-10-CM | POA: Diagnosis not present

## 2014-06-16 DIAGNOSIS — D631 Anemia in chronic kidney disease: Secondary | ICD-10-CM | POA: Diagnosis not present

## 2014-06-16 DIAGNOSIS — N2581 Secondary hyperparathyroidism of renal origin: Secondary | ICD-10-CM | POA: Diagnosis not present

## 2014-06-19 DIAGNOSIS — D631 Anemia in chronic kidney disease: Secondary | ICD-10-CM | POA: Diagnosis not present

## 2014-06-19 DIAGNOSIS — E1129 Type 2 diabetes mellitus with other diabetic kidney complication: Secondary | ICD-10-CM | POA: Diagnosis not present

## 2014-06-19 DIAGNOSIS — Z23 Encounter for immunization: Secondary | ICD-10-CM | POA: Diagnosis not present

## 2014-06-19 DIAGNOSIS — N186 End stage renal disease: Secondary | ICD-10-CM | POA: Diagnosis not present

## 2014-06-19 DIAGNOSIS — N2581 Secondary hyperparathyroidism of renal origin: Secondary | ICD-10-CM | POA: Diagnosis not present

## 2014-06-21 DIAGNOSIS — E1129 Type 2 diabetes mellitus with other diabetic kidney complication: Secondary | ICD-10-CM | POA: Diagnosis not present

## 2014-06-21 DIAGNOSIS — Z23 Encounter for immunization: Secondary | ICD-10-CM | POA: Diagnosis not present

## 2014-06-21 DIAGNOSIS — N2581 Secondary hyperparathyroidism of renal origin: Secondary | ICD-10-CM | POA: Diagnosis not present

## 2014-06-21 DIAGNOSIS — N039 Chronic nephritic syndrome with unspecified morphologic changes: Secondary | ICD-10-CM | POA: Diagnosis not present

## 2014-06-21 DIAGNOSIS — N186 End stage renal disease: Secondary | ICD-10-CM | POA: Diagnosis not present

## 2014-06-21 DIAGNOSIS — D631 Anemia in chronic kidney disease: Secondary | ICD-10-CM | POA: Diagnosis not present

## 2014-06-23 DIAGNOSIS — E1129 Type 2 diabetes mellitus with other diabetic kidney complication: Secondary | ICD-10-CM | POA: Diagnosis not present

## 2014-06-23 DIAGNOSIS — N039 Chronic nephritic syndrome with unspecified morphologic changes: Secondary | ICD-10-CM | POA: Diagnosis not present

## 2014-06-23 DIAGNOSIS — Z23 Encounter for immunization: Secondary | ICD-10-CM | POA: Diagnosis not present

## 2014-06-23 DIAGNOSIS — N2581 Secondary hyperparathyroidism of renal origin: Secondary | ICD-10-CM | POA: Diagnosis not present

## 2014-06-23 DIAGNOSIS — N186 End stage renal disease: Secondary | ICD-10-CM | POA: Diagnosis not present

## 2014-06-23 DIAGNOSIS — D631 Anemia in chronic kidney disease: Secondary | ICD-10-CM | POA: Diagnosis not present

## 2014-06-26 DIAGNOSIS — N186 End stage renal disease: Secondary | ICD-10-CM | POA: Diagnosis not present

## 2014-06-26 DIAGNOSIS — N2581 Secondary hyperparathyroidism of renal origin: Secondary | ICD-10-CM | POA: Diagnosis not present

## 2014-06-28 DIAGNOSIS — N2581 Secondary hyperparathyroidism of renal origin: Secondary | ICD-10-CM | POA: Diagnosis not present

## 2014-06-28 DIAGNOSIS — N186 End stage renal disease: Secondary | ICD-10-CM | POA: Diagnosis not present

## 2014-07-03 DIAGNOSIS — Z23 Encounter for immunization: Secondary | ICD-10-CM | POA: Diagnosis not present

## 2014-07-03 DIAGNOSIS — N186 End stage renal disease: Secondary | ICD-10-CM | POA: Diagnosis not present

## 2014-07-03 DIAGNOSIS — D631 Anemia in chronic kidney disease: Secondary | ICD-10-CM | POA: Diagnosis not present

## 2014-07-03 DIAGNOSIS — E1129 Type 2 diabetes mellitus with other diabetic kidney complication: Secondary | ICD-10-CM | POA: Diagnosis not present

## 2014-07-03 DIAGNOSIS — N2581 Secondary hyperparathyroidism of renal origin: Secondary | ICD-10-CM | POA: Diagnosis not present

## 2014-07-04 ENCOUNTER — Emergency Department (HOSPITAL_COMMUNITY)
Admission: EM | Admit: 2014-07-04 | Discharge: 2014-07-04 | Disposition: A | Discharge status: Home / Self Care | Payer: Medicare Other | Attending: Emergency Medicine | Admitting: Emergency Medicine

## 2014-07-04 ENCOUNTER — Encounter (HOSPITAL_COMMUNITY): Payer: Self-pay | Admitting: Emergency Medicine

## 2014-07-04 DIAGNOSIS — E1129 Type 2 diabetes mellitus with other diabetic kidney complication: Secondary | ICD-10-CM | POA: Insufficient documentation

## 2014-07-04 DIAGNOSIS — N186 End stage renal disease: Secondary | ICD-10-CM | POA: Diagnosis not present

## 2014-07-04 DIAGNOSIS — R112 Nausea with vomiting, unspecified: Secondary | ICD-10-CM | POA: Diagnosis not present

## 2014-07-04 DIAGNOSIS — Z79899 Other long term (current) drug therapy: Secondary | ICD-10-CM | POA: Diagnosis not present

## 2014-07-04 DIAGNOSIS — Z992 Dependence on renal dialysis: Secondary | ICD-10-CM | POA: Insufficient documentation

## 2014-07-04 DIAGNOSIS — R109 Unspecified abdominal pain: Secondary | ICD-10-CM | POA: Diagnosis not present

## 2014-07-04 DIAGNOSIS — Z880 Allergy status to penicillin: Secondary | ICD-10-CM | POA: Insufficient documentation

## 2014-07-04 DIAGNOSIS — Z872 Personal history of diseases of the skin and subcutaneous tissue: Secondary | ICD-10-CM | POA: Diagnosis not present

## 2014-07-04 DIAGNOSIS — R011 Cardiac murmur, unspecified: Secondary | ICD-10-CM | POA: Diagnosis not present

## 2014-07-04 DIAGNOSIS — Z8614 Personal history of Methicillin resistant Staphylococcus aureus infection: Secondary | ICD-10-CM | POA: Insufficient documentation

## 2014-07-04 DIAGNOSIS — E875 Hyperkalemia: Secondary | ICD-10-CM | POA: Diagnosis not present

## 2014-07-04 DIAGNOSIS — I10 Essential (primary) hypertension: Secondary | ICD-10-CM | POA: Diagnosis not present

## 2014-07-04 DIAGNOSIS — I120 Hypertensive chronic kidney disease with stage 5 chronic kidney disease or end stage renal disease: Secondary | ICD-10-CM | POA: Diagnosis not present

## 2014-07-04 LAB — CBC WITH DIFFERENTIAL/PLATELET
BASOS ABS: 0 10*3/uL (ref 0.0–0.1)
Basophils Relative: 1 % (ref 0–1)
EOS ABS: 0.1 10*3/uL (ref 0.0–0.7)
EOS PCT: 1 % (ref 0–5)
HEMATOCRIT: 31.1 % — AB (ref 39.0–52.0)
Hemoglobin: 10 g/dL — ABNORMAL LOW (ref 13.0–17.0)
LYMPHS ABS: 1 10*3/uL (ref 0.7–4.0)
LYMPHS PCT: 20 % (ref 12–46)
MCH: 27.9 pg (ref 26.0–34.0)
MCHC: 32.2 g/dL (ref 30.0–36.0)
MCV: 86.6 fL (ref 78.0–100.0)
MONO ABS: 0.2 10*3/uL (ref 0.1–1.0)
Monocytes Relative: 4 % (ref 3–12)
Neutro Abs: 3.6 10*3/uL (ref 1.7–7.7)
Neutrophils Relative %: 74 % (ref 43–77)
Platelets: 280 10*3/uL (ref 150–400)
RBC: 3.59 MIL/uL — ABNORMAL LOW (ref 4.22–5.81)
RDW: 16.1 % — AB (ref 11.5–15.5)
WBC: 4.8 10*3/uL (ref 4.0–10.5)

## 2014-07-04 LAB — COMPREHENSIVE METABOLIC PANEL
ALT: 23 U/L (ref 0–53)
ANION GAP: 22 — AB (ref 5–15)
AST: 13 U/L (ref 0–37)
Albumin: 4 g/dL (ref 3.5–5.2)
Alkaline Phosphatase: 89 U/L (ref 39–117)
BUN: 61 mg/dL — ABNORMAL HIGH (ref 6–23)
CALCIUM: 9.3 mg/dL (ref 8.4–10.5)
CO2: 23 meq/L (ref 19–32)
CREATININE: 12.3 mg/dL — AB (ref 0.50–1.35)
Chloride: 90 mEq/L — ABNORMAL LOW (ref 96–112)
GFR, EST AFRICAN AMERICAN: 5 mL/min — AB (ref >90)
GFR, EST NON AFRICAN AMERICAN: 4 mL/min — AB (ref >90)
GLUCOSE: 95 mg/dL (ref 70–99)
Potassium: 6.3 mEq/L — ABNORMAL HIGH (ref 3.7–5.3)
Sodium: 135 mEq/L — ABNORMAL LOW (ref 137–147)
TOTAL PROTEIN: 8.1 g/dL (ref 6.0–8.3)
Total Bilirubin: 0.4 mg/dL (ref 0.3–1.2)

## 2014-07-04 LAB — I-STAT CHEM 8, ED
BUN: 80 mg/dL — ABNORMAL HIGH (ref 6–23)
CALCIUM ION: 1 mmol/L — AB (ref 1.12–1.23)
Chloride: 97 mEq/L (ref 96–112)
Creatinine, Ser: 13.4 mg/dL — ABNORMAL HIGH (ref 0.50–1.35)
GLUCOSE: 69 mg/dL — AB (ref 70–99)
HCT: 33 % — ABNORMAL LOW (ref 39.0–52.0)
Hemoglobin: 11.2 g/dL — ABNORMAL LOW (ref 13.0–17.0)
Potassium: 6.1 mEq/L — ABNORMAL HIGH (ref 3.7–5.3)
Sodium: 133 mEq/L — ABNORMAL LOW (ref 137–147)
TCO2: 27 mmol/L (ref 0–100)

## 2014-07-04 LAB — I-STAT TROPONIN, ED
Troponin i, poc: 0 ng/mL (ref 0.00–0.08)
Troponin i, poc: 0.01 ng/mL (ref 0.00–0.08)

## 2014-07-04 LAB — LIPASE, BLOOD: LIPASE: 16 U/L (ref 11–59)

## 2014-07-04 MED ORDER — MORPHINE SULFATE 2 MG/ML IJ SOLN
2.0000 mg | Freq: Once | INTRAMUSCULAR | Status: AC
  Administered 2014-07-04: 2 mg via INTRAVENOUS
  Filled 2014-07-04: qty 1

## 2014-07-04 MED ORDER — ONDANSETRON 8 MG PO TBDP: 8.0000 mg | ORAL_TABLET | Freq: Three times a day (TID) | ORAL | Status: DC | PRN

## 2014-07-04 MED ORDER — SODIUM POLYSTYRENE SULFONATE 15 GM/60ML PO SUSP
60.0000 g | Freq: Once | ORAL | Status: AC
  Administered 2014-07-04: 60 g via ORAL
  Filled 2014-07-04: qty 240

## 2014-07-04 MED ORDER — ONDANSETRON HCL 4 MG/2ML IJ SOLN
4.0000 mg | Freq: Once | INTRAMUSCULAR | Status: AC
  Administered 2014-07-04: 4 mg via INTRAVENOUS
  Filled 2014-07-04: qty 2

## 2014-07-04 MED ORDER — ONDANSETRON 4 MG PO TBDP
8.0000 mg | ORAL_TABLET | Freq: Once | ORAL | Status: AC
  Administered 2014-07-04: 8 mg via ORAL
  Filled 2014-07-04: qty 2

## 2014-07-04 MED ORDER — SODIUM CHLORIDE 0.9 % IV BOLUS (SEPSIS)
500.0000 mL | Freq: Once | INTRAVENOUS | Status: AC
  Administered 2014-07-04: 500 mL via INTRAVENOUS

## 2014-07-04 NOTE — ED Notes (Signed)
Apologized to pt for wait time. Pt denies nausea. Pt in NAD.

## 2014-07-04 NOTE — ED Notes (Signed)
IV NURSE NOTIFIED TO START IV AFTER UNSUCCESSFUL ATTEMPTS TO ACCESS PERIPHERAL IV.

## 2014-07-04 NOTE — Discharge Instructions (Signed)
Hyperkalemia Hyperkalemia means you have too much potassium in your blood. Potassium is a type of salt in the blood (electrolyte). Normally, your kidneys remove potassium from the body. Too much potassium can be life-threatening. HOME CARE  Only take medicine as told by your doctor.  Do not take vitamins or natural products unless your doctor says they are okay.  Keep all doctor visits as told.  Follow diet instructions as told by your doctor. GET HELP RIGHT AWAY IF:  Your heartbeat is not regular or very slow.  You feel dizzy (lightheaded).  You feel weak.  You are short of breath.  You have chest pain.  You pass out (faint). MAKE SURE YOU:   Understand these instructions.  Will watch your condition.  Will get help right away if you are not doing well or get worse. Document Released: 10/06/2005 Document Revised: 12/29/2011 Document Reviewed: 01/11/2014 [facility] Patient Information 2015 [place], [place]. This information is not intended to replace advice given to you by your health care provider. Make sure you discuss any questions you have with your health care provider. Nausea and Vomiting Nausea means you feel sick to your stomach. Throwing up (vomiting) is a reflex where stomach contents come out of your mouth. HOME CARE   Take medicine as told by your doctor.  Do not force yourself to eat. However, you do need to drink fluids.  If you feel like eating, eat a normal diet as told by your doctor.  Eat rice, wheat, potatoes, bread, lean meats, yogurt, fruits, and vegetables.  Avoid high-fat foods.  Drink enough fluids to keep your pee (urine) clear or pale yellow.  Ask your doctor how to replace body fluid losses (rehydrate). Signs of body fluid loss (dehydration) include:  Feeling very thirsty.  Dry lips and mouth.  Feeling dizzy.  Dark pee.  Peeing less than normal.  Feeling confused.  Fast breathing or heart rate. GET HELP RIGHT AWAY IF:   You have  blood in your throw up.  You have black or bloody poop (stool).  You have a bad headache or stiff neck.  You feel confused.  You have bad belly (abdominal) pain.  You have chest pain or trouble breathing.  You do not pee at least once every 8 hours.  You have cold, clammy skin.  You keep throwing up after 24 to 48 hours.  You have a fever. MAKE SURE YOU:   Understand these instructions.  Will watch your condition.  Will get help right away if you are not doing well or get worse. Document Released: 03/24/2008 Document Revised: 12/29/2011 Document Reviewed: 03/07/2011 [facility] Patient Information 2015 [place], [place]. This information is not intended to replace advice given to you by your health care provider. Make sure you discuss any questions you have with your health care provider.

## 2014-07-04 NOTE — ED Provider Notes (Signed)
CSN: [id number]     Arrival date & time 07/04/14  1528 History   First MD Initiated Contact with Patient 07/04/14 2020     Chief Complaint  Patient presents with  . Nausea  . Abdominal Pain  . Emesis     (Consider location/radiation/quality/duration/timing/severity/associated sxs/prior Treatment) HPI 48 year old male on hemodialysis presents today complaining of nausea, vomiting, and abdominal cramping. He states that the symptoms began yesterday after dialysis. He has vomited multiple times and has been unable to keep down fluids by mouth. He describes the pain as diffuse and crampy and intermittent. It has improved. He has not vomited since he was given Zofran in the waiting. He makes very little urine and has not had any UTI symptoms. He denies having fever or chills. Denies lightheadedness or weakness. He states that his blood sugars have been controlled with diet. Past Medical History  Diagnosis Date  . Chills     at night - sometimes  . Fatigue     loss of fatigue  . MRSA (methicillin resistant staph aureus) culture positive   . Poor circulation   . Renal disorder   . Complication of anesthesia   . PONV (postoperative nausea and vomiting)   . Heart murmur     years ago  . History of blood transfusion   . Eczema   . Headache(784.0)     Years ago  . Hypertension     sees Dr. Jaci Standard  . Diabetes mellitus     controlled by diet  . 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 Av fistula placement  06-18-11    Right brachiocephalic AVF  . Soft tissue mass excision      Right arm, Left leg  for MRSA infection  . Eye surgery      left eye for Laser, diabetic retinopathy  . Hematoma evacuation Right Feb. 25, 2014  . Revison of arteriovenous fistula Right 12/14/2012    Procedure: REVISON OF upper arm ARTERIOVENOUS FISTULA using 6mmx10cm gortex graft;  Surgeon: Conrad Hemet, MD;  Location: [place];  Service: Vascular;   Laterality: Right;  . Insertion of dialysis catheter  12/14/2012    Procedure: INSERTION OF DIALYSIS CATHETER;  Surgeon: Conrad Mulat, MD;  Location: [place];  Service: Vascular;;  . Hematoma evacuation Right 12/14/2012    Procedure: EVACUATION HEMATOMA;  Surgeon: Conrad Bergoo, MD;  Location: [place];  Service: Vascular;  Laterality: Right;   Family History  Problem Relation Age of Onset  . Diabetes Mother    History  Substance Use Topics  . Smoking status: Never Smoker   . Smokeless tobacco: Never Used  . Alcohol Use: No    Review of Systems  All other systems reviewed and are negative.     Allergies  Amoxicillin; Percocet; Hydromorphone; and Oxycodone-acetaminophen  Home Medications   Prior to Admission medications   Medication Sig Start Date End Date Taking? Authorizing Provider  amLODipine (NORVASC) 10 MG tablet Take 10 mg by mouth at bedtime.    Yes Historical Provider, MD  calcium acetate (PHOSLO) 667 MG capsule Take 667 mg by mouth 3 (three) times daily.   Yes Historical Provider, MD  carvedilol (COREG) 25 MG tablet Take 25 mg by mouth 2 (two) times daily.   Yes Historical Provider, MD  sevelamer carbonate (RENVELA) 800 MG tablet Take 800 mg by mouth 3 (three) times daily with meals.   Yes Historical Provider, MD  BP 164/88  Pulse 97  Temp(Src) 97.8 F (36.6 C) (Oral)  Resp 16  SpO2 99% Physical Exam  Nursing note and vitals reviewed. Constitutional: He is oriented to person, place, and time. He appears well-developed and well-nourished.  HENT:  Head: Normocephalic and atraumatic.  Right Ear: External ear normal.  Left Ear: External ear normal.  Nose: Nose normal.  Mouth/Throat: Oropharynx is clear and moist.  Eyes: Conjunctivae and EOM are normal. Pupils are equal, round, and reactive to light.  Neck: Normal range of motion. Neck supple.  Cardiovascular: Normal rate, regular rhythm, normal heart sounds and intact distal pulses.   Pulmonary/Chest: Effort normal  and breath sounds normal. No respiratory distress. He has no wheezes. He exhibits no tenderness.  Abdominal: Soft. Bowel sounds are normal. He exhibits no distension and no mass. There is no tenderness. There is no guarding.  Musculoskeletal: Normal range of motion.  Patient with graft right upper extremity for dialysis.  Neurological: He is alert and oriented to person, place, and time. He has normal reflexes. He exhibits normal muscle tone. Coordination normal.  Skin: Skin is warm and dry.  Psychiatric: He has a normal mood and affect. His behavior is normal. Judgment and thought content normal.    ED Course  Procedures (including critical care time) Labs Review Labs Reviewed  CBC WITH DIFFERENTIAL - Abnormal; Notable for the following:    RBC 3.59 (*)    Hemoglobin 10.0 (*)    HCT 31.1 (*)    RDW 16.1 (*)    All other components within normal limits  COMPREHENSIVE METABOLIC PANEL - Abnormal; Notable for the following:    Sodium 135 (*)    Potassium 6.3 (*)    Chloride 90 (*)    BUN 61 (*)    Creatinine, Ser 12.30 (*)    GFR calc non Af Amer 4 (*)    GFR calc Af Amer 5 (*)    Anion gap 22 (*)    All other components within normal limits  LIPASE, BLOOD  URINALYSIS, ROUTINE W REFLEX MICROSCOPIC  I-STAT TROPOININ, ED  I-STAT TROPOININ, ED  I-STAT CHEM 8, ED    Imaging Review No results found.   EKG Interpretation   Date/Time:  Tuesday July 04 2014 20:05:09 EDT Ventricular Rate:  98 PR Interval:  157 QRS Duration: 81 QT Interval:  370 QTC Calculation: 472 R Axis:   48 Text Interpretation:  Normal sinus rhythm No significant change since last  tracing Confirmed by Thurma Priego MD, Andee Poles [id number]) on 07/04/2014 8:29:56 PM      MDM   Final diagnoses:  Non-intractable vomiting with nausea, vomiting of unspecified type  Hyperkalemia    Patient given IV fluids 500cc here. He has been taking by mouth without vomiting. Potassium is elevated here at 6.3. EKG is without  any acute changes consistent with hyperkalemia. His received IV fluids and has had Kayexalate by mouth. Repeat potassium is decreased to 6.1  He is due for dialysis in the morning. He has had no further vomiting and has tolerated fluids without difficulty. Patient is advised regarding return precautions and is discharged home in improved condition.   Shaune Pollack, MD 07/04/14 [phone]

## 2014-07-04 NOTE — ED Notes (Addendum)
Pt c/o mid upper abd pain n/v since yesterday. Denies diarrhea/fever. Pt sts he has also had a slight HA. sts that certain movements make the pain and nausea worse. Denies blood in emesis. Pt goes to dialysis MWF, last went yesterday and finished his treatment. sts that right after it is when he started to feel bad. Nad, skin warm and dry, resp e/u.

## 2014-07-04 NOTE — ED Notes (Addendum)
This tech requested urine sample from pt. Pt currently on Dialysis and makes little urine. Unable to give sample. RN notified.

## 2014-07-05 ENCOUNTER — Emergency Department (HOSPITAL_COMMUNITY)
Admission: EM | Admit: 2014-07-05 | Discharge: 2014-07-05 | Disposition: A | Discharge status: Home / Self Care | Payer: Medicare Other | Attending: Emergency Medicine | Admitting: Emergency Medicine

## 2014-07-05 ENCOUNTER — Encounter (HOSPITAL_COMMUNITY): Payer: Self-pay | Admitting: Emergency Medicine

## 2014-07-05 DIAGNOSIS — Z79899 Other long term (current) drug therapy: Secondary | ICD-10-CM | POA: Insufficient documentation

## 2014-07-05 DIAGNOSIS — R51 Headache: Secondary | ICD-10-CM | POA: Insufficient documentation

## 2014-07-05 DIAGNOSIS — Z880 Allergy status to penicillin: Secondary | ICD-10-CM | POA: Diagnosis not present

## 2014-07-05 DIAGNOSIS — E1129 Type 2 diabetes mellitus with other diabetic kidney complication: Secondary | ICD-10-CM | POA: Insufficient documentation

## 2014-07-05 DIAGNOSIS — Z23 Encounter for immunization: Secondary | ICD-10-CM | POA: Diagnosis not present

## 2014-07-05 DIAGNOSIS — Z8614 Personal history of Methicillin resistant Staphylococcus aureus infection: Secondary | ICD-10-CM | POA: Diagnosis not present

## 2014-07-05 DIAGNOSIS — D631 Anemia in chronic kidney disease: Secondary | ICD-10-CM | POA: Diagnosis not present

## 2014-07-05 DIAGNOSIS — N186 End stage renal disease: Secondary | ICD-10-CM | POA: Insufficient documentation

## 2014-07-05 DIAGNOSIS — Z992 Dependence on renal dialysis: Secondary | ICD-10-CM | POA: Diagnosis not present

## 2014-07-05 DIAGNOSIS — R109 Unspecified abdominal pain: Secondary | ICD-10-CM | POA: Insufficient documentation

## 2014-07-05 DIAGNOSIS — R011 Cardiac murmur, unspecified: Secondary | ICD-10-CM | POA: Diagnosis not present

## 2014-07-05 DIAGNOSIS — I120 Hypertensive chronic kidney disease with stage 5 chronic kidney disease or end stage renal disease: Secondary | ICD-10-CM | POA: Insufficient documentation

## 2014-07-05 DIAGNOSIS — I10 Essential (primary) hypertension: Secondary | ICD-10-CM | POA: Diagnosis not present

## 2014-07-05 DIAGNOSIS — N039 Chronic nephritic syndrome with unspecified morphologic changes: Secondary | ICD-10-CM | POA: Diagnosis not present

## 2014-07-05 DIAGNOSIS — N2581 Secondary hyperparathyroidism of renal origin: Secondary | ICD-10-CM | POA: Diagnosis not present

## 2014-07-05 DIAGNOSIS — Z872 Personal history of diseases of the skin and subcutaneous tissue: Secondary | ICD-10-CM | POA: Diagnosis not present

## 2014-07-05 DIAGNOSIS — G43909 Migraine, unspecified, not intractable, without status migrainosus: Secondary | ICD-10-CM | POA: Diagnosis not present

## 2014-07-05 DIAGNOSIS — G43809 Other migraine, not intractable, without status migrainosus: Secondary | ICD-10-CM | POA: Diagnosis not present

## 2014-07-05 LAB — COMPREHENSIVE METABOLIC PANEL
ALT: 16 U/L (ref 0–53)
AST: 9 U/L (ref 0–37)
Albumin: 3.9 g/dL (ref 3.5–5.2)
Alkaline Phosphatase: 84 U/L (ref 39–117)
Anion gap: 19 — ABNORMAL HIGH (ref 5–15)
BUN: 29 mg/dL — ABNORMAL HIGH (ref 6–23)
CALCIUM: 8.9 mg/dL (ref 8.4–10.5)
CO2: 25 mEq/L (ref 19–32)
Chloride: 91 mEq/L — ABNORMAL LOW (ref 96–112)
Creatinine, Ser: 8.51 mg/dL — ABNORMAL HIGH (ref 0.50–1.35)
GFR, EST AFRICAN AMERICAN: 8 mL/min — AB (ref >90)
GFR, EST NON AFRICAN AMERICAN: 7 mL/min — AB (ref >90)
GLUCOSE: 91 mg/dL (ref 70–99)
Potassium: 4.2 mEq/L (ref 3.7–5.3)
Sodium: 135 mEq/L — ABNORMAL LOW (ref 137–147)
Total Bilirubin: 0.3 mg/dL (ref 0.3–1.2)
Total Protein: 8 g/dL (ref 6.0–8.3)

## 2014-07-05 LAB — CBC WITH DIFFERENTIAL/PLATELET
BASOS ABS: 0 10*3/uL (ref 0.0–0.1)
BASOS PCT: 1 % (ref 0–1)
EOS PCT: 0 % (ref 0–5)
Eosinophils Absolute: 0 10*3/uL (ref 0.0–0.7)
HCT: 31 % — ABNORMAL LOW (ref 39.0–52.0)
Hemoglobin: 10.1 g/dL — ABNORMAL LOW (ref 13.0–17.0)
Lymphocytes Relative: 26 % (ref 12–46)
Lymphs Abs: 1.1 10*3/uL (ref 0.7–4.0)
MCH: 28.5 pg (ref 26.0–34.0)
MCHC: 32.6 g/dL (ref 30.0–36.0)
MCV: 87.6 fL (ref 78.0–100.0)
Monocytes Absolute: 0.2 10*3/uL (ref 0.1–1.0)
Monocytes Relative: 6 % (ref 3–12)
Neutro Abs: 2.8 10*3/uL (ref 1.7–7.7)
Neutrophils Relative %: 67 % (ref 43–77)
Platelets: 237 10*3/uL (ref 150–400)
RBC: 3.54 MIL/uL — ABNORMAL LOW (ref 4.22–5.81)
RDW: 15.8 % — AB (ref 11.5–15.5)
WBC: 4.1 10*3/uL (ref 4.0–10.5)

## 2014-07-05 LAB — LIPASE, BLOOD: Lipase: 15 U/L (ref 11–59)

## 2014-07-05 MED ORDER — METOCLOPRAMIDE HCL 5 MG/ML IJ SOLN
10.0000 mg | Freq: Once | INTRAMUSCULAR | Status: AC
  Administered 2014-07-05: 10 mg via INTRAVENOUS
  Filled 2014-07-05: qty 2

## 2014-07-05 MED ORDER — ONDANSETRON HCL 4 MG/2ML IJ SOLN
4.0000 mg | Freq: Once | INTRAMUSCULAR | Status: AC
  Administered 2014-07-05: 4 mg via INTRAVENOUS
  Filled 2014-07-05: qty 2

## 2014-07-05 MED ORDER — VALPROATE SODIUM 500 MG/5ML IV SOLN
500.0000 mg | Freq: Once | INTRAVENOUS | Status: AC
  Administered 2014-07-05: 500 mg via INTRAVENOUS
  Filled 2014-07-05: qty 5

## 2014-07-05 MED ORDER — DIPHENHYDRAMINE HCL 50 MG/ML IJ SOLN
25.0000 mg | Freq: Once | INTRAMUSCULAR | Status: AC
  Administered 2014-07-05: 25 mg via INTRAVENOUS
  Filled 2014-07-05: qty 1

## 2014-07-05 MED ORDER — METOCLOPRAMIDE HCL 10 MG PO TABS: 10.0000 mg | ORAL_TABLET | Freq: Four times a day (QID) | ORAL | Status: DC | PRN

## 2014-07-05 NOTE — ED Notes (Signed)
Pt reports being seen here last night for headache, was given iv meds which helped but then pt had dialysis today and then return of severe headache and n/v. No acute distress noted at triage.

## 2014-07-05 NOTE — ED Notes (Addendum)
Pt c/o abd pain and n/v. sts he was seen here yesterday for the same and was given medications then sent home. sts he was sent home with a prescription but hasn't gotten it filled yet. Pt sts he went to dialysis today and made it through the entire treatment. sts that after dialysis he started to feel the same way as yesterday. sts that he did miss his dialysis appointment this past Friday and in the past when he has missed his dialysis appointments he has had similar symptoms. Pt also c/o HA. Nad, skin warm and dry, resp e/u.

## 2014-07-05 NOTE — ED Provider Notes (Signed)
CSN: [id number]     Arrival date & time 07/05/14  1806 History   First MD Initiated Contact with Patient 07/05/14 1859     Chief Complaint  Patient presents with  . Headache  . Vomiting      HPI  Patient presents with headache and nausea. He states that Monday after dialysis he developed headache throbbing left-sided retro-orbital headache. Monday and Tuesday developed vomiting. States he was seen last night but mostly because of vomiting his abdomen has become sore "from all the vomiting". He had treatment here last night was feeling much improved. He went to dialysis morning. By the end of the session he felt like he was having the same symptoms again. Had recurrent headache. Had developing nausea and has had several episodes of emesis take his abdomen is "sore again". No hematemesis. No fevers. No neck pain. Has had prior similar throbbing headaches in the past.  Past Medical History  Diagnosis Date  . Chills     at night - sometimes  . Fatigue     loss of fatigue  . MRSA (methicillin resistant staph aureus) culture positive   . Poor circulation   . Renal disorder   . Complication of anesthesia   . PONV (postoperative nausea and vomiting)   . Heart murmur     years ago  . History of blood transfusion   . Eczema   . Headache(784.0)     Years ago  . Hypertension     sees Dr. Jaci Standard  . Diabetes mellitus     controlled by diet  . 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 Av fistula placement  06-18-11    Right brachiocephalic AVF  . Soft tissue mass excision      Right arm, Left leg  for MRSA infection  . Eye surgery      left eye for Laser, diabetic retinopathy  . Hematoma evacuation Right Feb. 25, 2014  . Revison of arteriovenous fistula Right 12/14/2012    Procedure: REVISON OF upper arm ARTERIOVENOUS FISTULA using 44mmx10cm gortex graft;  Surgeon: Conrad Alpharetta, MD;  Location: [place];  Service: Vascular;   Laterality: Right;  . Insertion of dialysis catheter  12/14/2012    Procedure: INSERTION OF DIALYSIS CATHETER;  Surgeon: Conrad Patchogue, MD;  Location: [place];  Service: Vascular;;  . Hematoma evacuation Right 12/14/2012    Procedure: EVACUATION HEMATOMA;  Surgeon: Conrad Mauriceville, MD;  Location: [place];  Service: Vascular;  Laterality: Right;   Family History  Problem Relation Age of Onset  . Diabetes Mother    History  Substance Use Topics  . Smoking status: Never Smoker   . Smokeless tobacco: Never Used  . Alcohol Use: No    Review of Systems  Constitutional: Negative for fever, chills, diaphoresis, appetite change and fatigue.  HENT: Negative for mouth sores, sore throat and trouble swallowing.   Eyes: Negative for visual disturbance.  Respiratory: Negative for cough, chest tightness, shortness of breath and wheezing.   Cardiovascular: Negative for chest pain.  Gastrointestinal: Positive for nausea, vomiting and abdominal pain. Negative for diarrhea and abdominal distention.  Endocrine: Negative for polydipsia, polyphagia and polyuria.  Genitourinary: Negative for dysuria, frequency and hematuria.  Musculoskeletal: Negative for gait problem.  Skin: Negative for color change, pallor and rash.  Neurological: Positive for headaches. Negative for dizziness, syncope and light-headedness.  Hematological: Does not bruise/bleed easily.  Psychiatric/Behavioral: Negative for behavioral problems  and confusion.      Allergies  Amoxicillin; Hydromorphone; and Percocet  Home Medications   Prior to Admission medications   Medication Sig Start Date End Date Taking? Authorizing Provider  calcium acetate (PHOSLO) 667 MG capsule Take 667 mg by mouth 3 (three) times daily.   Yes Historical Provider, MD  FOSRENOL 1000 MG chewable tablet Chew 1,000 mg by mouth 3 (three) times daily with meals. 03/30/14  Yes Historical Provider, MD  sevelamer carbonate (RENVELA) 800 MG tablet Take 800 mg by mouth 3  (three) times daily with meals.   Yes Historical Provider, MD  metoCLOPramide (REGLAN) 10 MG tablet Take 1 tablet (10 mg total) by mouth every 6 (six) hours as needed (Headache or nausea). 07/05/14   Tanna Furry, MD   BP 137/75  Pulse 96  Temp(Src) 98.7 F (37.1 C) (Oral)  Resp 13  Ht 5\' 10"  (1.778 m)  Wt 230 lb (104.327 kg)  BMI 33.00 kg/m2  SpO2 94% Physical Exam  Constitutional: He is oriented to person, place, and time. He appears well-developed and well-nourished. No distress.  HENT:  Head: Normocephalic.  Eyes: Conjunctivae are normal. Pupils are equal, round, and reactive to light. No scleral icterus.  Neck: Normal range of motion. Neck supple. No thyromegaly present.  Cardiovascular: Normal rate and regular rhythm.  Exam reveals no gallop and no friction rub.   No murmur heard. Pulmonary/Chest: Effort normal and breath sounds normal. No respiratory distress. He has no wheezes. He has no rales.  Abdominal: Soft. Bowel sounds are normal. He exhibits no distension. There is no tenderness. There is no rebound.  Soft. No specific area of tenderness. No peritoneal irritation. Negative Murphy sign.  Musculoskeletal: Normal range of motion.  Neurological: He is alert and oriented to person, place, and time.  Normal cranial nerves. No neck stiffness. Not altered. Normal extremity strength and sensation.  Skin: Skin is warm and dry. No rash noted.  Psychiatric: He has a normal mood and affect. His behavior is normal.    ED Course  Procedures (including critical care time) Labs Review Labs Reviewed  COMPREHENSIVE METABOLIC PANEL - Abnormal; Notable for the following:    Sodium 135 (*)    Chloride 91 (*)    BUN 29 (*)    Creatinine, Ser 8.51 (*)    GFR calc non Af Amer 7 (*)    GFR calc Af Amer 8 (*)    Anion gap 19 (*)    All other components within normal limits  CBC WITH DIFFERENTIAL - Abnormal; Notable for the following:    RBC 3.54 (*)    Hemoglobin 10.1 (*)    HCT 31.0  (*)    RDW 15.8 (*)    All other components within normal limits  LIPASE, BLOOD    Imaging Review No results found.   EKG Interpretation None      MDM   Final diagnoses:  Other migraine without status migrainosus, not intractable    Patient with complete relief of headache and nausea. Reassuring labs. Appropriate for discharge home. Follow up with primary care physician. When necessary Reglan with recurrence.    Tanna Furry, MD 07/05/14 2236

## 2014-07-05 NOTE — Discharge Instructions (Signed)

## 2014-07-07 DIAGNOSIS — N186 End stage renal disease: Secondary | ICD-10-CM | POA: Diagnosis not present

## 2014-07-07 DIAGNOSIS — N039 Chronic nephritic syndrome with unspecified morphologic changes: Secondary | ICD-10-CM | POA: Diagnosis not present

## 2014-07-07 DIAGNOSIS — E1129 Type 2 diabetes mellitus with other diabetic kidney complication: Secondary | ICD-10-CM | POA: Diagnosis not present

## 2014-07-07 DIAGNOSIS — D631 Anemia in chronic kidney disease: Secondary | ICD-10-CM | POA: Diagnosis not present

## 2014-07-07 DIAGNOSIS — N2581 Secondary hyperparathyroidism of renal origin: Secondary | ICD-10-CM | POA: Diagnosis not present

## 2014-07-07 DIAGNOSIS — Z23 Encounter for immunization: Secondary | ICD-10-CM | POA: Diagnosis not present

## 2014-07-07 DIAGNOSIS — D689 Coagulation defect, unspecified: Secondary | ICD-10-CM | POA: Insufficient documentation

## 2014-07-10 DIAGNOSIS — N186 End stage renal disease: Secondary | ICD-10-CM | POA: Diagnosis not present

## 2014-07-10 DIAGNOSIS — N039 Chronic nephritic syndrome with unspecified morphologic changes: Secondary | ICD-10-CM | POA: Diagnosis not present

## 2014-07-10 DIAGNOSIS — E1129 Type 2 diabetes mellitus with other diabetic kidney complication: Secondary | ICD-10-CM | POA: Diagnosis not present

## 2014-07-10 DIAGNOSIS — N2581 Secondary hyperparathyroidism of renal origin: Secondary | ICD-10-CM | POA: Diagnosis not present

## 2014-07-10 DIAGNOSIS — D631 Anemia in chronic kidney disease: Secondary | ICD-10-CM | POA: Diagnosis not present

## 2014-07-10 DIAGNOSIS — Z23 Encounter for immunization: Secondary | ICD-10-CM | POA: Diagnosis not present

## 2014-07-12 DIAGNOSIS — E1129 Type 2 diabetes mellitus with other diabetic kidney complication: Secondary | ICD-10-CM | POA: Diagnosis not present

## 2014-07-12 DIAGNOSIS — N2581 Secondary hyperparathyroidism of renal origin: Secondary | ICD-10-CM | POA: Diagnosis not present

## 2014-07-12 DIAGNOSIS — N186 End stage renal disease: Secondary | ICD-10-CM | POA: Diagnosis not present

## 2014-07-12 DIAGNOSIS — D631 Anemia in chronic kidney disease: Secondary | ICD-10-CM | POA: Diagnosis not present

## 2014-07-12 DIAGNOSIS — Z23 Encounter for immunization: Secondary | ICD-10-CM | POA: Diagnosis not present

## 2014-07-12 DIAGNOSIS — N039 Chronic nephritic syndrome with unspecified morphologic changes: Secondary | ICD-10-CM | POA: Diagnosis not present

## 2014-07-14 DIAGNOSIS — N186 End stage renal disease: Secondary | ICD-10-CM | POA: Diagnosis not present

## 2014-07-14 DIAGNOSIS — Z23 Encounter for immunization: Secondary | ICD-10-CM | POA: Diagnosis not present

## 2014-07-14 DIAGNOSIS — D631 Anemia in chronic kidney disease: Secondary | ICD-10-CM | POA: Diagnosis not present

## 2014-07-14 DIAGNOSIS — E1129 Type 2 diabetes mellitus with other diabetic kidney complication: Secondary | ICD-10-CM | POA: Diagnosis not present

## 2014-07-14 DIAGNOSIS — N2581 Secondary hyperparathyroidism of renal origin: Secondary | ICD-10-CM | POA: Diagnosis not present

## 2014-07-17 DIAGNOSIS — N186 End stage renal disease: Secondary | ICD-10-CM | POA: Diagnosis not present

## 2014-07-17 DIAGNOSIS — E1129 Type 2 diabetes mellitus with other diabetic kidney complication: Secondary | ICD-10-CM | POA: Diagnosis not present

## 2014-07-17 DIAGNOSIS — N039 Chronic nephritic syndrome with unspecified morphologic changes: Secondary | ICD-10-CM | POA: Diagnosis not present

## 2014-07-17 DIAGNOSIS — N2581 Secondary hyperparathyroidism of renal origin: Secondary | ICD-10-CM | POA: Diagnosis not present

## 2014-07-17 DIAGNOSIS — D631 Anemia in chronic kidney disease: Secondary | ICD-10-CM | POA: Diagnosis not present

## 2014-07-17 DIAGNOSIS — Z23 Encounter for immunization: Secondary | ICD-10-CM | POA: Diagnosis not present

## 2014-07-19 DIAGNOSIS — N2581 Secondary hyperparathyroidism of renal origin: Secondary | ICD-10-CM | POA: Diagnosis not present

## 2014-07-19 DIAGNOSIS — D631 Anemia in chronic kidney disease: Secondary | ICD-10-CM | POA: Diagnosis not present

## 2014-07-19 DIAGNOSIS — N186 End stage renal disease: Secondary | ICD-10-CM | POA: Diagnosis not present

## 2014-07-19 DIAGNOSIS — N039 Chronic nephritic syndrome with unspecified morphologic changes: Secondary | ICD-10-CM | POA: Diagnosis not present

## 2014-07-19 DIAGNOSIS — Z23 Encounter for immunization: Secondary | ICD-10-CM | POA: Diagnosis not present

## 2014-07-19 DIAGNOSIS — E1129 Type 2 diabetes mellitus with other diabetic kidney complication: Secondary | ICD-10-CM | POA: Diagnosis not present

## 2014-07-21 DIAGNOSIS — D631 Anemia in chronic kidney disease: Secondary | ICD-10-CM | POA: Diagnosis not present

## 2014-07-21 DIAGNOSIS — N2581 Secondary hyperparathyroidism of renal origin: Secondary | ICD-10-CM | POA: Diagnosis not present

## 2014-07-21 DIAGNOSIS — N186 End stage renal disease: Secondary | ICD-10-CM | POA: Diagnosis not present

## 2014-08-19 DIAGNOSIS — N186 End stage renal disease: Secondary | ICD-10-CM | POA: Diagnosis not present

## 2014-08-19 DIAGNOSIS — Z992 Dependence on renal dialysis: Secondary | ICD-10-CM | POA: Diagnosis not present

## 2014-08-21 DIAGNOSIS — N2581 Secondary hyperparathyroidism of renal origin: Secondary | ICD-10-CM | POA: Diagnosis not present

## 2014-08-21 DIAGNOSIS — N186 End stage renal disease: Secondary | ICD-10-CM | POA: Diagnosis not present

## 2014-08-21 DIAGNOSIS — D631 Anemia in chronic kidney disease: Secondary | ICD-10-CM | POA: Diagnosis not present

## 2014-08-21 DIAGNOSIS — D509 Iron deficiency anemia, unspecified: Secondary | ICD-10-CM | POA: Diagnosis not present

## 2014-09-18 DIAGNOSIS — N186 End stage renal disease: Secondary | ICD-10-CM | POA: Diagnosis not present

## 2014-09-18 DIAGNOSIS — Z992 Dependence on renal dialysis: Secondary | ICD-10-CM | POA: Diagnosis not present

## 2014-09-20 DIAGNOSIS — Z23 Encounter for immunization: Secondary | ICD-10-CM | POA: Diagnosis not present

## 2014-09-20 DIAGNOSIS — D509 Iron deficiency anemia, unspecified: Secondary | ICD-10-CM | POA: Diagnosis not present

## 2014-09-20 DIAGNOSIS — N186 End stage renal disease: Secondary | ICD-10-CM | POA: Diagnosis not present

## 2014-09-20 DIAGNOSIS — N2581 Secondary hyperparathyroidism of renal origin: Secondary | ICD-10-CM | POA: Diagnosis not present

## 2014-09-20 DIAGNOSIS — D631 Anemia in chronic kidney disease: Secondary | ICD-10-CM | POA: Diagnosis not present

## 2014-09-28 ENCOUNTER — Encounter (HOSPITAL_COMMUNITY): Payer: Self-pay | Admitting: Vascular Surgery

## 2014-10-19 DIAGNOSIS — N186 End stage renal disease: Secondary | ICD-10-CM | POA: Diagnosis not present

## 2014-10-19 DIAGNOSIS — Z992 Dependence on renal dialysis: Secondary | ICD-10-CM | POA: Diagnosis not present

## 2014-10-21 DIAGNOSIS — N2581 Secondary hyperparathyroidism of renal origin: Secondary | ICD-10-CM | POA: Diagnosis not present

## 2014-10-21 DIAGNOSIS — D509 Iron deficiency anemia, unspecified: Secondary | ICD-10-CM | POA: Diagnosis not present

## 2014-10-21 DIAGNOSIS — N186 End stage renal disease: Secondary | ICD-10-CM | POA: Diagnosis not present

## 2014-10-21 DIAGNOSIS — D631 Anemia in chronic kidney disease: Secondary | ICD-10-CM | POA: Diagnosis not present

## 2014-11-02 ENCOUNTER — Encounter (HOSPITAL_COMMUNITY): Payer: Self-pay | Admitting: Vascular Surgery

## 2014-11-19 DIAGNOSIS — N186 End stage renal disease: Secondary | ICD-10-CM | POA: Diagnosis not present

## 2014-11-19 DIAGNOSIS — Z992 Dependence on renal dialysis: Secondary | ICD-10-CM | POA: Diagnosis not present

## 2014-11-20 DIAGNOSIS — E1129 Type 2 diabetes mellitus with other diabetic kidney complication: Secondary | ICD-10-CM | POA: Diagnosis not present

## 2014-11-20 DIAGNOSIS — N186 End stage renal disease: Secondary | ICD-10-CM | POA: Diagnosis not present

## 2014-11-20 DIAGNOSIS — N2581 Secondary hyperparathyroidism of renal origin: Secondary | ICD-10-CM | POA: Diagnosis not present

## 2014-11-20 DIAGNOSIS — D631 Anemia in chronic kidney disease: Secondary | ICD-10-CM | POA: Diagnosis not present

## 2014-12-13 DIAGNOSIS — E1129 Type 2 diabetes mellitus with other diabetic kidney complication: Secondary | ICD-10-CM | POA: Diagnosis not present

## 2014-12-18 DIAGNOSIS — N186 End stage renal disease: Secondary | ICD-10-CM | POA: Diagnosis not present

## 2014-12-18 DIAGNOSIS — Z992 Dependence on renal dialysis: Secondary | ICD-10-CM | POA: Diagnosis not present

## 2014-12-20 DIAGNOSIS — N186 End stage renal disease: Secondary | ICD-10-CM | POA: Diagnosis not present

## 2014-12-20 DIAGNOSIS — N2581 Secondary hyperparathyroidism of renal origin: Secondary | ICD-10-CM | POA: Diagnosis not present

## 2014-12-20 DIAGNOSIS — D631 Anemia in chronic kidney disease: Secondary | ICD-10-CM | POA: Diagnosis not present

## 2014-12-20 DIAGNOSIS — E1129 Type 2 diabetes mellitus with other diabetic kidney complication: Secondary | ICD-10-CM | POA: Diagnosis not present

## 2015-01-18 DIAGNOSIS — Z992 Dependence on renal dialysis: Secondary | ICD-10-CM | POA: Diagnosis not present

## 2015-01-18 DIAGNOSIS — N186 End stage renal disease: Secondary | ICD-10-CM | POA: Diagnosis not present

## 2015-01-19 DIAGNOSIS — N2581 Secondary hyperparathyroidism of renal origin: Secondary | ICD-10-CM | POA: Diagnosis not present

## 2015-01-19 DIAGNOSIS — N186 End stage renal disease: Secondary | ICD-10-CM | POA: Diagnosis not present

## 2015-01-19 DIAGNOSIS — D631 Anemia in chronic kidney disease: Secondary | ICD-10-CM | POA: Diagnosis not present

## 2015-01-22 DIAGNOSIS — N2581 Secondary hyperparathyroidism of renal origin: Secondary | ICD-10-CM | POA: Diagnosis not present

## 2015-01-22 DIAGNOSIS — D631 Anemia in chronic kidney disease: Secondary | ICD-10-CM | POA: Diagnosis not present

## 2015-01-22 DIAGNOSIS — N186 End stage renal disease: Secondary | ICD-10-CM | POA: Diagnosis not present

## 2015-01-24 DIAGNOSIS — N186 End stage renal disease: Secondary | ICD-10-CM | POA: Diagnosis not present

## 2015-01-24 DIAGNOSIS — D631 Anemia in chronic kidney disease: Secondary | ICD-10-CM | POA: Diagnosis not present

## 2015-01-24 DIAGNOSIS — N2581 Secondary hyperparathyroidism of renal origin: Secondary | ICD-10-CM | POA: Diagnosis not present

## 2015-01-26 DIAGNOSIS — D631 Anemia in chronic kidney disease: Secondary | ICD-10-CM | POA: Diagnosis not present

## 2015-01-26 DIAGNOSIS — N186 End stage renal disease: Secondary | ICD-10-CM | POA: Diagnosis not present

## 2015-01-26 DIAGNOSIS — N2581 Secondary hyperparathyroidism of renal origin: Secondary | ICD-10-CM | POA: Diagnosis not present

## 2015-01-29 DIAGNOSIS — N2581 Secondary hyperparathyroidism of renal origin: Secondary | ICD-10-CM | POA: Diagnosis not present

## 2015-01-29 DIAGNOSIS — D631 Anemia in chronic kidney disease: Secondary | ICD-10-CM | POA: Diagnosis not present

## 2015-01-29 DIAGNOSIS — N186 End stage renal disease: Secondary | ICD-10-CM | POA: Diagnosis not present

## 2015-01-31 DIAGNOSIS — N2581 Secondary hyperparathyroidism of renal origin: Secondary | ICD-10-CM | POA: Diagnosis not present

## 2015-01-31 DIAGNOSIS — D631 Anemia in chronic kidney disease: Secondary | ICD-10-CM | POA: Diagnosis not present

## 2015-01-31 DIAGNOSIS — N186 End stage renal disease: Secondary | ICD-10-CM | POA: Diagnosis not present

## 2015-02-01 DIAGNOSIS — R197 Diarrhea, unspecified: Secondary | ICD-10-CM | POA: Insufficient documentation

## 2015-02-01 DIAGNOSIS — R52 Pain, unspecified: Secondary | ICD-10-CM | POA: Insufficient documentation

## 2015-02-01 DIAGNOSIS — R509 Fever, unspecified: Secondary | ICD-10-CM | POA: Insufficient documentation

## 2015-02-01 DIAGNOSIS — L299 Pruritus, unspecified: Secondary | ICD-10-CM | POA: Insufficient documentation

## 2015-02-02 DIAGNOSIS — D631 Anemia in chronic kidney disease: Secondary | ICD-10-CM | POA: Diagnosis not present

## 2015-02-02 DIAGNOSIS — N186 End stage renal disease: Secondary | ICD-10-CM | POA: Diagnosis not present

## 2015-02-02 DIAGNOSIS — N2581 Secondary hyperparathyroidism of renal origin: Secondary | ICD-10-CM | POA: Diagnosis not present

## 2015-02-05 DIAGNOSIS — N2581 Secondary hyperparathyroidism of renal origin: Secondary | ICD-10-CM | POA: Diagnosis not present

## 2015-02-05 DIAGNOSIS — D631 Anemia in chronic kidney disease: Secondary | ICD-10-CM | POA: Diagnosis not present

## 2015-02-05 DIAGNOSIS — N186 End stage renal disease: Secondary | ICD-10-CM | POA: Diagnosis not present

## 2015-02-07 DIAGNOSIS — N2581 Secondary hyperparathyroidism of renal origin: Secondary | ICD-10-CM | POA: Diagnosis not present

## 2015-02-07 DIAGNOSIS — D631 Anemia in chronic kidney disease: Secondary | ICD-10-CM | POA: Diagnosis not present

## 2015-02-07 DIAGNOSIS — N186 End stage renal disease: Secondary | ICD-10-CM | POA: Diagnosis not present

## 2015-02-09 DIAGNOSIS — D631 Anemia in chronic kidney disease: Secondary | ICD-10-CM | POA: Diagnosis not present

## 2015-02-09 DIAGNOSIS — N2581 Secondary hyperparathyroidism of renal origin: Secondary | ICD-10-CM | POA: Diagnosis not present

## 2015-02-09 DIAGNOSIS — N186 End stage renal disease: Secondary | ICD-10-CM | POA: Diagnosis not present

## 2015-02-12 DIAGNOSIS — N2581 Secondary hyperparathyroidism of renal origin: Secondary | ICD-10-CM | POA: Diagnosis not present

## 2015-02-12 DIAGNOSIS — N186 End stage renal disease: Secondary | ICD-10-CM | POA: Diagnosis not present

## 2015-02-12 DIAGNOSIS — D631 Anemia in chronic kidney disease: Secondary | ICD-10-CM | POA: Diagnosis not present

## 2015-02-13 DIAGNOSIS — I871 Compression of vein: Secondary | ICD-10-CM | POA: Diagnosis not present

## 2015-02-13 DIAGNOSIS — Z992 Dependence on renal dialysis: Secondary | ICD-10-CM | POA: Diagnosis not present

## 2015-02-13 DIAGNOSIS — T82858D Stenosis of vascular prosthetic devices, implants and grafts, subsequent encounter: Secondary | ICD-10-CM | POA: Diagnosis not present

## 2015-02-13 DIAGNOSIS — N186 End stage renal disease: Secondary | ICD-10-CM | POA: Diagnosis not present

## 2015-02-14 DIAGNOSIS — N2581 Secondary hyperparathyroidism of renal origin: Secondary | ICD-10-CM | POA: Diagnosis not present

## 2015-02-14 DIAGNOSIS — E1129 Type 2 diabetes mellitus with other diabetic kidney complication: Secondary | ICD-10-CM | POA: Diagnosis not present

## 2015-02-14 DIAGNOSIS — D631 Anemia in chronic kidney disease: Secondary | ICD-10-CM | POA: Diagnosis not present

## 2015-02-14 DIAGNOSIS — N186 End stage renal disease: Secondary | ICD-10-CM | POA: Diagnosis not present

## 2015-02-15 DIAGNOSIS — D631 Anemia in chronic kidney disease: Secondary | ICD-10-CM | POA: Diagnosis not present

## 2015-02-15 DIAGNOSIS — N186 End stage renal disease: Secondary | ICD-10-CM | POA: Diagnosis not present

## 2015-02-15 DIAGNOSIS — N2581 Secondary hyperparathyroidism of renal origin: Secondary | ICD-10-CM | POA: Diagnosis not present

## 2015-02-17 DIAGNOSIS — Z992 Dependence on renal dialysis: Secondary | ICD-10-CM | POA: Diagnosis not present

## 2015-02-17 DIAGNOSIS — E1129 Type 2 diabetes mellitus with other diabetic kidney complication: Secondary | ICD-10-CM | POA: Diagnosis not present

## 2015-02-17 DIAGNOSIS — N186 End stage renal disease: Secondary | ICD-10-CM | POA: Diagnosis not present

## 2015-02-19 DIAGNOSIS — N186 End stage renal disease: Secondary | ICD-10-CM | POA: Diagnosis not present

## 2015-02-19 DIAGNOSIS — D631 Anemia in chronic kidney disease: Secondary | ICD-10-CM | POA: Diagnosis not present

## 2015-02-19 DIAGNOSIS — N2581 Secondary hyperparathyroidism of renal origin: Secondary | ICD-10-CM | POA: Diagnosis not present

## 2015-03-20 DIAGNOSIS — E1129 Type 2 diabetes mellitus with other diabetic kidney complication: Secondary | ICD-10-CM | POA: Diagnosis not present

## 2015-03-20 DIAGNOSIS — N186 End stage renal disease: Secondary | ICD-10-CM | POA: Diagnosis not present

## 2015-03-20 DIAGNOSIS — Z992 Dependence on renal dialysis: Secondary | ICD-10-CM | POA: Diagnosis not present

## 2015-03-21 DIAGNOSIS — E1129 Type 2 diabetes mellitus with other diabetic kidney complication: Secondary | ICD-10-CM | POA: Diagnosis not present

## 2015-03-21 DIAGNOSIS — D631 Anemia in chronic kidney disease: Secondary | ICD-10-CM | POA: Diagnosis not present

## 2015-03-21 DIAGNOSIS — N186 End stage renal disease: Secondary | ICD-10-CM | POA: Diagnosis not present

## 2015-03-21 DIAGNOSIS — N2581 Secondary hyperparathyroidism of renal origin: Secondary | ICD-10-CM | POA: Diagnosis not present

## 2015-03-23 DIAGNOSIS — E1129 Type 2 diabetes mellitus with other diabetic kidney complication: Secondary | ICD-10-CM | POA: Diagnosis not present

## 2015-03-23 DIAGNOSIS — N2581 Secondary hyperparathyroidism of renal origin: Secondary | ICD-10-CM | POA: Diagnosis not present

## 2015-03-23 DIAGNOSIS — N186 End stage renal disease: Secondary | ICD-10-CM | POA: Diagnosis not present

## 2015-03-23 DIAGNOSIS — D631 Anemia in chronic kidney disease: Secondary | ICD-10-CM | POA: Diagnosis not present

## 2015-03-26 DIAGNOSIS — D631 Anemia in chronic kidney disease: Secondary | ICD-10-CM | POA: Diagnosis not present

## 2015-03-26 DIAGNOSIS — N186 End stage renal disease: Secondary | ICD-10-CM | POA: Diagnosis not present

## 2015-03-26 DIAGNOSIS — E1129 Type 2 diabetes mellitus with other diabetic kidney complication: Secondary | ICD-10-CM | POA: Diagnosis not present

## 2015-03-26 DIAGNOSIS — N2581 Secondary hyperparathyroidism of renal origin: Secondary | ICD-10-CM | POA: Diagnosis not present

## 2015-03-28 DIAGNOSIS — D631 Anemia in chronic kidney disease: Secondary | ICD-10-CM | POA: Diagnosis not present

## 2015-03-28 DIAGNOSIS — N186 End stage renal disease: Secondary | ICD-10-CM | POA: Diagnosis not present

## 2015-03-28 DIAGNOSIS — E1129 Type 2 diabetes mellitus with other diabetic kidney complication: Secondary | ICD-10-CM | POA: Diagnosis not present

## 2015-03-28 DIAGNOSIS — N2581 Secondary hyperparathyroidism of renal origin: Secondary | ICD-10-CM | POA: Diagnosis not present

## 2015-03-30 DIAGNOSIS — E1129 Type 2 diabetes mellitus with other diabetic kidney complication: Secondary | ICD-10-CM | POA: Diagnosis not present

## 2015-03-30 DIAGNOSIS — N186 End stage renal disease: Secondary | ICD-10-CM | POA: Diagnosis not present

## 2015-03-30 DIAGNOSIS — N2581 Secondary hyperparathyroidism of renal origin: Secondary | ICD-10-CM | POA: Diagnosis not present

## 2015-03-30 DIAGNOSIS — D631 Anemia in chronic kidney disease: Secondary | ICD-10-CM | POA: Diagnosis not present

## 2015-04-02 DIAGNOSIS — D631 Anemia in chronic kidney disease: Secondary | ICD-10-CM | POA: Diagnosis not present

## 2015-04-02 DIAGNOSIS — E1129 Type 2 diabetes mellitus with other diabetic kidney complication: Secondary | ICD-10-CM | POA: Diagnosis not present

## 2015-04-02 DIAGNOSIS — N186 End stage renal disease: Secondary | ICD-10-CM | POA: Diagnosis not present

## 2015-04-02 DIAGNOSIS — N2581 Secondary hyperparathyroidism of renal origin: Secondary | ICD-10-CM | POA: Diagnosis not present

## 2015-04-04 DIAGNOSIS — E1129 Type 2 diabetes mellitus with other diabetic kidney complication: Secondary | ICD-10-CM | POA: Diagnosis not present

## 2015-04-04 DIAGNOSIS — N2581 Secondary hyperparathyroidism of renal origin: Secondary | ICD-10-CM | POA: Diagnosis not present

## 2015-04-04 DIAGNOSIS — D631 Anemia in chronic kidney disease: Secondary | ICD-10-CM | POA: Diagnosis not present

## 2015-04-04 DIAGNOSIS — N186 End stage renal disease: Secondary | ICD-10-CM | POA: Diagnosis not present

## 2015-04-06 DIAGNOSIS — D631 Anemia in chronic kidney disease: Secondary | ICD-10-CM | POA: Diagnosis not present

## 2015-04-06 DIAGNOSIS — N186 End stage renal disease: Secondary | ICD-10-CM | POA: Diagnosis not present

## 2015-04-06 DIAGNOSIS — E1129 Type 2 diabetes mellitus with other diabetic kidney complication: Secondary | ICD-10-CM | POA: Diagnosis not present

## 2015-04-06 DIAGNOSIS — N2581 Secondary hyperparathyroidism of renal origin: Secondary | ICD-10-CM | POA: Diagnosis not present

## 2015-04-09 DIAGNOSIS — E1129 Type 2 diabetes mellitus with other diabetic kidney complication: Secondary | ICD-10-CM | POA: Diagnosis not present

## 2015-04-09 DIAGNOSIS — N2581 Secondary hyperparathyroidism of renal origin: Secondary | ICD-10-CM | POA: Diagnosis not present

## 2015-04-09 DIAGNOSIS — N186 End stage renal disease: Secondary | ICD-10-CM | POA: Diagnosis not present

## 2015-04-09 DIAGNOSIS — D631 Anemia in chronic kidney disease: Secondary | ICD-10-CM | POA: Diagnosis not present

## 2015-04-11 DIAGNOSIS — N186 End stage renal disease: Secondary | ICD-10-CM | POA: Diagnosis not present

## 2015-04-11 DIAGNOSIS — N2581 Secondary hyperparathyroidism of renal origin: Secondary | ICD-10-CM | POA: Diagnosis not present

## 2015-04-11 DIAGNOSIS — E1129 Type 2 diabetes mellitus with other diabetic kidney complication: Secondary | ICD-10-CM | POA: Diagnosis not present

## 2015-04-11 DIAGNOSIS — D631 Anemia in chronic kidney disease: Secondary | ICD-10-CM | POA: Diagnosis not present

## 2015-04-13 DIAGNOSIS — E1129 Type 2 diabetes mellitus with other diabetic kidney complication: Secondary | ICD-10-CM | POA: Diagnosis not present

## 2015-04-13 DIAGNOSIS — N186 End stage renal disease: Secondary | ICD-10-CM | POA: Diagnosis not present

## 2015-04-13 DIAGNOSIS — N2581 Secondary hyperparathyroidism of renal origin: Secondary | ICD-10-CM | POA: Diagnosis not present

## 2015-04-13 DIAGNOSIS — D631 Anemia in chronic kidney disease: Secondary | ICD-10-CM | POA: Diagnosis not present

## 2015-04-16 DIAGNOSIS — E1129 Type 2 diabetes mellitus with other diabetic kidney complication: Secondary | ICD-10-CM | POA: Diagnosis not present

## 2015-04-16 DIAGNOSIS — N2581 Secondary hyperparathyroidism of renal origin: Secondary | ICD-10-CM | POA: Diagnosis not present

## 2015-04-16 DIAGNOSIS — D631 Anemia in chronic kidney disease: Secondary | ICD-10-CM | POA: Diagnosis not present

## 2015-04-16 DIAGNOSIS — N186 End stage renal disease: Secondary | ICD-10-CM | POA: Diagnosis not present

## 2015-04-18 DIAGNOSIS — D631 Anemia in chronic kidney disease: Secondary | ICD-10-CM | POA: Diagnosis not present

## 2015-04-18 DIAGNOSIS — E1129 Type 2 diabetes mellitus with other diabetic kidney complication: Secondary | ICD-10-CM | POA: Diagnosis not present

## 2015-04-18 DIAGNOSIS — N2581 Secondary hyperparathyroidism of renal origin: Secondary | ICD-10-CM | POA: Diagnosis not present

## 2015-04-18 DIAGNOSIS — N186 End stage renal disease: Secondary | ICD-10-CM | POA: Diagnosis not present

## 2015-04-19 DIAGNOSIS — N186 End stage renal disease: Secondary | ICD-10-CM | POA: Diagnosis not present

## 2015-04-19 DIAGNOSIS — Z992 Dependence on renal dialysis: Secondary | ICD-10-CM | POA: Diagnosis not present

## 2015-04-19 DIAGNOSIS — D631 Anemia in chronic kidney disease: Secondary | ICD-10-CM | POA: Diagnosis not present

## 2015-04-19 DIAGNOSIS — E1129 Type 2 diabetes mellitus with other diabetic kidney complication: Secondary | ICD-10-CM | POA: Diagnosis not present

## 2015-04-19 DIAGNOSIS — N2581 Secondary hyperparathyroidism of renal origin: Secondary | ICD-10-CM | POA: Diagnosis not present

## 2015-04-23 DIAGNOSIS — N186 End stage renal disease: Secondary | ICD-10-CM | POA: Diagnosis not present

## 2015-04-23 DIAGNOSIS — N2581 Secondary hyperparathyroidism of renal origin: Secondary | ICD-10-CM | POA: Diagnosis not present

## 2015-04-25 DIAGNOSIS — N186 End stage renal disease: Secondary | ICD-10-CM | POA: Diagnosis not present

## 2015-04-25 DIAGNOSIS — N2581 Secondary hyperparathyroidism of renal origin: Secondary | ICD-10-CM | POA: Diagnosis not present

## 2015-04-27 DIAGNOSIS — N2581 Secondary hyperparathyroidism of renal origin: Secondary | ICD-10-CM | POA: Diagnosis not present

## 2015-04-27 DIAGNOSIS — N186 End stage renal disease: Secondary | ICD-10-CM | POA: Diagnosis not present

## 2015-04-30 DIAGNOSIS — D631 Anemia in chronic kidney disease: Secondary | ICD-10-CM | POA: Diagnosis not present

## 2015-04-30 DIAGNOSIS — E1129 Type 2 diabetes mellitus with other diabetic kidney complication: Secondary | ICD-10-CM | POA: Diagnosis not present

## 2015-04-30 DIAGNOSIS — N2581 Secondary hyperparathyroidism of renal origin: Secondary | ICD-10-CM | POA: Diagnosis not present

## 2015-04-30 DIAGNOSIS — N186 End stage renal disease: Secondary | ICD-10-CM | POA: Diagnosis not present

## 2015-05-02 DIAGNOSIS — D631 Anemia in chronic kidney disease: Secondary | ICD-10-CM | POA: Diagnosis not present

## 2015-05-02 DIAGNOSIS — N2581 Secondary hyperparathyroidism of renal origin: Secondary | ICD-10-CM | POA: Diagnosis not present

## 2015-05-02 DIAGNOSIS — N186 End stage renal disease: Secondary | ICD-10-CM | POA: Diagnosis not present

## 2015-05-02 DIAGNOSIS — E1129 Type 2 diabetes mellitus with other diabetic kidney complication: Secondary | ICD-10-CM | POA: Diagnosis not present

## 2015-05-04 DIAGNOSIS — Z885 Allergy status to narcotic agent status: Secondary | ICD-10-CM

## 2015-05-04 DIAGNOSIS — E1122 Type 2 diabetes mellitus with diabetic chronic kidney disease: Secondary | ICD-10-CM | POA: Diagnosis present

## 2015-05-04 DIAGNOSIS — E11319 Type 2 diabetes mellitus with unspecified diabetic retinopathy without macular edema: Secondary | ICD-10-CM | POA: Diagnosis present

## 2015-05-04 DIAGNOSIS — I120 Hypertensive chronic kidney disease with stage 5 chronic kidney disease or end stage renal disease: Secondary | ICD-10-CM | POA: Diagnosis present

## 2015-05-04 DIAGNOSIS — Z9111 Patient's noncompliance with dietary regimen: Secondary | ICD-10-CM | POA: Diagnosis present

## 2015-05-04 DIAGNOSIS — Z881 Allergy status to other antibiotic agents status: Secondary | ICD-10-CM

## 2015-05-04 DIAGNOSIS — E1165 Type 2 diabetes mellitus with hyperglycemia: Secondary | ICD-10-CM | POA: Diagnosis not present

## 2015-05-04 DIAGNOSIS — N186 End stage renal disease: Secondary | ICD-10-CM | POA: Diagnosis not present

## 2015-05-04 DIAGNOSIS — R079 Chest pain, unspecified: Secondary | ICD-10-CM | POA: Diagnosis not present

## 2015-05-04 DIAGNOSIS — E1129 Type 2 diabetes mellitus with other diabetic kidney complication: Secondary | ICD-10-CM | POA: Diagnosis not present

## 2015-05-04 DIAGNOSIS — E875 Hyperkalemia: Secondary | ICD-10-CM | POA: Diagnosis present

## 2015-05-04 DIAGNOSIS — D631 Anemia in chronic kidney disease: Secondary | ICD-10-CM | POA: Diagnosis present

## 2015-05-04 DIAGNOSIS — Z992 Dependence on renal dialysis: Secondary | ICD-10-CM

## 2015-05-04 DIAGNOSIS — R0789 Other chest pain: Principal | ICD-10-CM | POA: Diagnosis present

## 2015-05-04 DIAGNOSIS — E871 Hypo-osmolality and hyponatremia: Secondary | ICD-10-CM | POA: Diagnosis present

## 2015-05-04 DIAGNOSIS — N2581 Secondary hyperparathyroidism of renal origin: Secondary | ICD-10-CM | POA: Diagnosis not present

## 2015-05-05 ENCOUNTER — Encounter (HOSPITAL_COMMUNITY): Payer: Self-pay | Admitting: Emergency Medicine

## 2015-05-05 ENCOUNTER — Emergency Department (HOSPITAL_COMMUNITY): Payer: Medicare Other

## 2015-05-05 ENCOUNTER — Inpatient Hospital Stay (HOSPITAL_COMMUNITY)
Admission: EM | Admit: 2015-05-05 | Discharge: 2015-05-06 | DRG: 313 | Disposition: A | Discharge status: Home / Self Care | Payer: Medicare Other | Attending: Internal Medicine | Admitting: Internal Medicine

## 2015-05-05 DIAGNOSIS — R739 Hyperglycemia, unspecified: Secondary | ICD-10-CM | POA: Diagnosis not present

## 2015-05-05 DIAGNOSIS — Z992 Dependence on renal dialysis: Secondary | ICD-10-CM | POA: Diagnosis not present

## 2015-05-05 DIAGNOSIS — E11319 Type 2 diabetes mellitus with unspecified diabetic retinopathy without macular edema: Secondary | ICD-10-CM | POA: Diagnosis present

## 2015-05-05 DIAGNOSIS — N186 End stage renal disease: Secondary | ICD-10-CM | POA: Diagnosis not present

## 2015-05-05 DIAGNOSIS — E0821 Diabetes mellitus due to underlying condition with diabetic nephropathy: Secondary | ICD-10-CM | POA: Diagnosis not present

## 2015-05-05 DIAGNOSIS — N189 Chronic kidney disease, unspecified: Secondary | ICD-10-CM | POA: Diagnosis not present

## 2015-05-05 DIAGNOSIS — E1122 Type 2 diabetes mellitus with diabetic chronic kidney disease: Secondary | ICD-10-CM | POA: Diagnosis not present

## 2015-05-05 DIAGNOSIS — R079 Chest pain, unspecified: Secondary | ICD-10-CM | POA: Diagnosis not present

## 2015-05-05 DIAGNOSIS — E871 Hypo-osmolality and hyponatremia: Secondary | ICD-10-CM | POA: Diagnosis present

## 2015-05-05 DIAGNOSIS — E875 Hyperkalemia: Secondary | ICD-10-CM | POA: Diagnosis present

## 2015-05-05 DIAGNOSIS — D631 Anemia in chronic kidney disease: Secondary | ICD-10-CM | POA: Diagnosis present

## 2015-05-05 DIAGNOSIS — E1129 Type 2 diabetes mellitus with other diabetic kidney complication: Secondary | ICD-10-CM | POA: Diagnosis not present

## 2015-05-05 DIAGNOSIS — Z9111 Patient's noncompliance with dietary regimen: Secondary | ICD-10-CM | POA: Diagnosis present

## 2015-05-05 DIAGNOSIS — E1165 Type 2 diabetes mellitus with hyperglycemia: Secondary | ICD-10-CM | POA: Diagnosis present

## 2015-05-05 DIAGNOSIS — Z881 Allergy status to other antibiotic agents status: Secondary | ICD-10-CM | POA: Diagnosis not present

## 2015-05-05 DIAGNOSIS — I120 Hypertensive chronic kidney disease with stage 5 chronic kidney disease or end stage renal disease: Secondary | ICD-10-CM | POA: Diagnosis present

## 2015-05-05 DIAGNOSIS — Z885 Allergy status to narcotic agent status: Secondary | ICD-10-CM | POA: Diagnosis not present

## 2015-05-05 DIAGNOSIS — R0789 Other chest pain: Secondary | ICD-10-CM | POA: Diagnosis not present

## 2015-05-05 DIAGNOSIS — E119 Type 2 diabetes mellitus without complications: Secondary | ICD-10-CM | POA: Diagnosis present

## 2015-05-05 HISTORY — DX: Hyperglycemia, unspecified: R73.9

## 2015-05-05 LAB — BASIC METABOLIC PANEL
Anion gap: 12 (ref 5–15)
Anion gap: 14 (ref 5–15)
BUN: 32 mg/dL — AB (ref 6–20)
BUN: 34 mg/dL — AB (ref 6–20)
CHLORIDE: 86 mmol/L — AB (ref 101–111)
CO2: 26 mmol/L (ref 22–32)
CO2: 26 mmol/L (ref 22–32)
CREATININE: 8.63 mg/dL — AB (ref 0.61–1.24)
Calcium: 8.5 mg/dL — ABNORMAL LOW (ref 8.9–10.3)
Calcium: 9.1 mg/dL (ref 8.9–10.3)
Chloride: 94 mmol/L — ABNORMAL LOW (ref 101–111)
Creatinine, Ser: 7.8 mg/dL — ABNORMAL HIGH (ref 0.61–1.24)
GFR calc Af Amer: 8 mL/min — ABNORMAL LOW (ref >60)
GFR calc Af Amer: 7 mL/min — ABNORMAL LOW (ref >60)
GFR calc non Af Amer: 7 mL/min — ABNORMAL LOW (ref >60)
GFR calc non Af Amer: 6 mL/min — ABNORMAL LOW (ref >60)
GLUCOSE: 156 mg/dL — AB (ref 65–99)
GLUCOSE: 824 mg/dL — AB (ref 65–99)
POTASSIUM: 3.8 mmol/L (ref 3.5–5.1)
Potassium: 5.5 mmol/L — ABNORMAL HIGH (ref 3.5–5.1)
Sodium: 124 mmol/L — ABNORMAL LOW (ref 135–145)
Sodium: 134 mmol/L — ABNORMAL LOW (ref 135–145)

## 2015-05-05 LAB — GLUCOSE, CAPILLARY
GLUCOSE-CAPILLARY: 123 mg/dL — AB (ref 65–99)
GLUCOSE-CAPILLARY: 117 mg/dL — AB (ref 65–99)
Glucose-Capillary: 533 mg/dL — ABNORMAL HIGH (ref 65–99)
Glucose-Capillary: 379 mg/dL — ABNORMAL HIGH (ref 65–99)
Glucose-Capillary: 169 mg/dL — ABNORMAL HIGH (ref 65–99)
Glucose-Capillary: 153 mg/dL — ABNORMAL HIGH (ref 65–99)
Glucose-Capillary: 121 mg/dL — ABNORMAL HIGH (ref 65–99)
Glucose-Capillary: 146 mg/dL — ABNORMAL HIGH (ref 65–99)
Glucose-Capillary: 155 mg/dL — ABNORMAL HIGH (ref 65–99)
Glucose-Capillary: 156 mg/dL — ABNORMAL HIGH (ref 65–99)
Glucose-Capillary: 154 mg/dL — ABNORMAL HIGH (ref 65–99)

## 2015-05-05 LAB — I-STAT TROPONIN, ED: Troponin i, poc: 0.01 ng/mL (ref 0.00–0.08)

## 2015-05-05 LAB — TROPONIN I
Troponin I: <0.03 ng/mL (ref <0.031)
Troponin I: <0.03 ng/mL (ref <0.031)
Troponin I: <0.03 ng/mL (ref <0.031)

## 2015-05-05 LAB — CBG MONITORING, ED: Glucose-Capillary: >600 mg/dL (ref 65–99)

## 2015-05-05 LAB — CBC
HCT: 32.1 % — ABNORMAL LOW (ref 39.0–52.0)
Hemoglobin: 10.5 g/dL — ABNORMAL LOW (ref 13.0–17.0)
MCH: 29.7 pg (ref 26.0–34.0)
MCHC: 32.7 g/dL (ref 30.0–36.0)
MCV: 90.7 fL (ref 78.0–100.0)
PLATELETS: 247 10*3/uL (ref 150–400)
RBC: 3.54 MIL/uL — ABNORMAL LOW (ref 4.22–5.81)
RDW: 15.5 % (ref 11.5–15.5)
WBC: 7.8 10*3/uL (ref 4.0–10.5)

## 2015-05-05 LAB — MRSA PCR SCREENING: MRSA BY PCR: NEGATIVE

## 2015-05-05 MED ORDER — NITROGLYCERIN 0.4 MG SL SUBL
0.4000 mg | SUBLINGUAL_TABLET | SUBLINGUAL | Status: DC | PRN
  Filled 2015-05-05: qty 1

## 2015-05-05 MED ORDER — INSULIN ASPART 100 UNIT/ML ~~LOC~~ SOLN: 0.0000 [IU] | Freq: Every day | SUBCUTANEOUS | Status: DC

## 2015-05-05 MED ORDER — SODIUM CHLORIDE 0.9 % IV SOLN
INTRAVENOUS | Status: DC
  Administered 2015-05-05: 5.4 [IU]/h via INTRAVENOUS
  Administered 2015-05-05: 6.4 [IU]/h via INTRAVENOUS
  Filled 2015-05-05: qty 2.5

## 2015-05-05 MED ORDER — ONDANSETRON HCL 4 MG/2ML IJ SOLN
4.0000 mg | Freq: Four times a day (QID) | INTRAMUSCULAR | Status: DC | PRN
Start: 2015-05-05 — End: 2015-05-06

## 2015-05-05 MED ORDER — HEPARIN SODIUM (PORCINE) 5000 UNIT/ML IJ SOLN
5000.0000 [IU] | Freq: Three times a day (TID) | INTRAMUSCULAR | Status: DC
Start: 2015-05-05 — End: 2015-05-06
  Administered 2015-05-05 (×2): 5000 [IU] via SUBCUTANEOUS
  Filled 2015-05-05 (×7): qty 1

## 2015-05-05 MED ORDER — DEXTROSE 50 % IV SOLN: 25.0000 mL | INTRAVENOUS | Status: DC | PRN

## 2015-05-05 MED ORDER — INSULIN REGULAR BOLUS VIA INFUSION
0.0000 [IU] | Freq: Three times a day (TID) | INTRAVENOUS | Status: DC
  Administered 2015-05-05: 3 [IU] via INTRAVENOUS
  Filled 2015-05-05: qty 10

## 2015-05-05 MED ORDER — DEXTROSE-NACL 5-0.45 % IV SOLN
INTRAVENOUS | Status: DC
  Administered 2015-05-05: 07:00:00 via INTRAVENOUS

## 2015-05-05 MED ORDER — SODIUM CHLORIDE 0.9 % IV SOLN
INTRAVENOUS | Status: DC
Start: 2015-05-05 — End: 2015-05-05
  Administered 2015-05-05: 05:00:00 via INTRAVENOUS

## 2015-05-05 MED ORDER — ASPIRIN 81 MG PO CHEW
324.0000 mg | CHEWABLE_TABLET | Freq: Once | ORAL | Status: AC
  Administered 2015-05-05: 324 mg via ORAL
  Filled 2015-05-05: qty 4

## 2015-05-05 MED ORDER — INSULIN ASPART 100 UNIT/ML ~~LOC~~ SOLN
0.0000 [IU] | Freq: Three times a day (TID) | SUBCUTANEOUS | Status: DC
  Administered 2015-05-05 (×2): 2 [IU] via SUBCUTANEOUS
  Administered 2015-05-06: 1 [IU] via SUBCUTANEOUS

## 2015-05-05 MED ORDER — ACETAMINOPHEN 325 MG PO TABS
650.0000 mg | ORAL_TABLET | ORAL | Status: DC | PRN
  Administered 2015-05-05: 650 mg via ORAL
  Filled 2015-05-05: qty 2

## 2015-05-05 MED ORDER — INSULIN GLARGINE 100 UNIT/ML ~~LOC~~ SOLN
18.0000 [IU] | Freq: Every day | SUBCUTANEOUS | Status: DC
Start: 2015-05-05 — End: 2015-05-06
  Administered 2015-05-05 – 2015-05-06 (×2): 18 [IU] via SUBCUTANEOUS
  Filled 2015-05-05 (×4): qty 0.18

## 2015-05-05 NOTE — Progress Notes (Signed)
Pt insulin drip stopped per order. [name] Gaines Chaston Bradburn RN.

## 2015-05-05 NOTE — Consult Note (Signed)
CONSULTATION NOTE  Reason for Consult: Chest pain  Requesting Physician: Dr. Algis Liming  Cardiologist: Dr. Marlou Porch  HPI: This is a 49 y.o. male with a past medical history significant for type 2 diabetes and hypertension with the development of end-stage renal disease on dialysis. He is followed by Dr. Emmie Niemann and recently has been undergoing workup for renal transplant. He previously saw Dr. Marlou Porch and cardiology in 2014 and was having chest pain at that time. He underwent a nuclear stress test which was negative for ischemia. He's had recurrent sharp chest pain symptoms. Last year he underwent a stress echocardiogram as part of a renal transplant workup which was also negative for ischemia. He now presents with a sharp left chest pain which radiated to the left shoulder. It was worse when laying on his left side and worse when moving his left arm. He said he also felt bad laying on the stomach. It was associated with some shortness of breath, but that is now completely resolved. Troponins overnight were negative. EKG shows sinus rhythm with LVH by voltage but no ischemic changes. It is unchanged from prior EKG. Cardiology is asked to consult regarding management of chest pain.  PMHx:  Past Medical History  Diagnosis Date  . Chills     at night - sometimes  . Fatigue     loss of fatigue  . MRSA (methicillin resistant staph aureus) culture positive   . Poor circulation   . Renal disorder   . Complication of anesthesia   . PONV (postoperative nausea and vomiting)   . Heart murmur     years ago  . History of blood transfusion   . Eczema   . Headache(784.0)     Years ago  . Hypertension     sees Dr. Jaci Standard  . Diabetes mellitus     controlled by diet  . 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 Av fistula placement  06-18-11    Right brachiocephalic AVF  . Soft tissue mass excision      Right arm, Left leg  for MRSA  infection  . Eye surgery      left eye for Laser, diabetic retinopathy  . Hematoma evacuation Right Feb. 25, 2014  . Revison of arteriovenous fistula Right 12/14/2012    Procedure: REVISON OF upper arm ARTERIOVENOUS FISTULA using 32mx10cm gortex graft;  Surgeon: BConrad Lennox MD;  [place]  Service: Vascular;  Laterality: Right;  . Insertion of dialysis catheter  12/14/2012    Procedure: INSERTION OF DIALYSIS CATHETER;  Surgeon: BConrad Doffing MD;  [place]  Service: Vascular;;  . Hematoma evacuation Right 12/14/2012    Procedure: EVACUATION HEMATOMA;  Surgeon: BConrad Palmyra MD;  [place]  Service: Vascular;  Laterality: Right;  . Fistulogram Right 11/06/2011    Procedure: FISTULOGRAM;  Surgeon: BConrad Scotland MD;  [facility];  Service: Cardiovascular;  Laterality: Right;    FAMHx: Family History  Problem Relation Age of Onset  . Diabetes Mother     SOCHx:  reports that he has never smoked. He has never used smokeless tobacco. He reports that he does not drink alcohol or use illicit drugs.  ALLERGIES: Allergies  Allergen Reactions  . Amoxicillin Itching  . Hydromorphone Itching    Patient states he may take with benadryl. Makes feet itch.  .Marland KitchenPercocet [Oxycodone-Acetaminophen] Itching and Other (See Comments)    Legs  only. Itching and burning feeling.    ROS: A comprehensive review of systems was negative except for: Cardiovascular: positive for chest pain  HOME MEDICATIONS: Prescriptions prior to admission  Medication Sig Dispense Refill Last Dose  . sevelamer carbonate (RENVELA) 800 MG tablet Take 800 mg by mouth 3 (three) times daily with meals.   05/04/2015 at Unknown time  . metoCLOPramide (REGLAN) 10 MG tablet Take 1 tablet (10 mg total) by mouth every 6 (six) hours as needed (Headache or nausea). (Patient not taking: Reported on 05/05/2015) 30 tablet 0 Not Taking at Unknown time    HOSPITAL MEDICATIONS: I have reviewed the patient's current  medications.  VITALS: Blood pressure 146/75, pulse 91, temperature 97.8 F (36.6 C), temperature source Oral, resp. rate 18, height 5' 11" (1.803 m), weight 244 lb 8 oz (110.904 kg), SpO2 100 %.  PHYSICAL EXAM: General appearance: alert and no distress Neck: no carotid bruit and no JVD Lungs: clear to auscultation bilaterally Heart: regular rate and rhythm, S1, S2 normal, no murmur, click, rub or gallop Abdomen: soft, non-tender; bowel sounds normal; no masses,  no organomegaly Extremities: extremities normal, atraumatic, no cyanosis or edema Pulses: 2+ and symmetric Skin: Skin color, texture, turgor normal. No rashes or lesions Neurologic: Grossly normal Psych: Pleasant  LABS: Results for orders placed or performed during the hospital encounter of 05/05/15 (from the past 48 hour(s))  Basic metabolic panel     Status: Abnormal   Collection Time: 05/05/15 12:19 AM  Result Value Ref Range   Sodium 124 (L) 135 - 145 mmol/L   Potassium 5.5 (H) 3.5 - 5.1 mmol/L   Chloride 86 (L) 101 - 111 mmol/L   CO2 26 22 - 32 mmol/L   Glucose, Bld 824 (HH) 65 - 99 mg/dL    Comment: REPEATED TO VERIFY CRITICAL RESULT CALLED TO, READ BACK BY AND VERIFIED WITH: B BELCHER,RN [id number] WILDERK    BUN 32 (H) 6 - 20 mg/dL   Creatinine, Ser 7.80 (H) 0.61 - 1.24 mg/dL   Calcium 8.5 (L) 8.9 - 10.3 mg/dL   GFR calc non Af Amer 7 (L) >60 mL/min   GFR calc Af Amer 8 (L) >60 mL/min    Comment: (NOTE) The eGFR has been calculated using the CKD EPI equation. This calculation has not been validated in all clinical situations. eGFR's persistently <60 mL/min signify possible Chronic Kidney Disease.    Anion gap 12 5 - 15  CBC     Status: Abnormal   Collection Time: 05/05/15 12:19 AM  Result Value Ref Range   WBC 7.8 4.0 - 10.5 K/uL   RBC 3.54 (L) 4.22 - 5.81 MIL/uL   Hemoglobin 10.5 (L) 13.0 - 17.0 g/dL   HCT 32.1 (L) 39.0 - 52.0 %   MCV 90.7 78.0 - 100.0 fL   MCH 29.7 26.0 - 34.0 pg   MCHC 32.7  30.0 - 36.0 g/dL   RDW 15.5 11.5 - 15.5 %   Platelets 247 150 - 400 K/uL  I-stat troponin, ED     Status: None   Collection Time: 05/05/15 12:25 AM  Result Value Ref Range   Troponin i, poc 0.01 0.00 - 0.08 ng/mL   Comment 3            Comment: Due to the release kinetics of cTnI, a negative result within the first hours of the onset of symptoms does not rule out myocardial infarction with certainty. If myocardial infarction is still suspected, repeat the test  at appropriate intervals.   CBG monitoring, ED     Status: Abnormal   Collection Time: 05/05/15  1:16 AM  Result Value Ref Range   Glucose-Capillary >600 (HH) 65 - 99 mg/dL  Glucose, capillary     Status: Abnormal   Collection Time: 05/05/15  4:14 AM  Result Value Ref Range   Glucose-Capillary 533 (H) 65 - 99 mg/dL  Troponin I-serum (0, 3, 6 hours)     Status: None   Collection Time: 05/05/15  4:30 AM  Result Value Ref Range   Troponin I <0.03 <0.031 ng/mL    Comment:        NO INDICATION OF MYOCARDIAL INJURY.   Glucose, capillary     Status: Abnormal   Collection Time: 05/05/15  5:22 AM  Result Value Ref Range   Glucose-Capillary 379 (H) 65 - 99 mg/dL  Glucose, capillary     Status: Abnormal   Collection Time: 05/05/15  6:20 AM  Result Value Ref Range   Glucose-Capillary 169 (H) 65 - 99 mg/dL  MRSA PCR Screening     Status: None   Collection Time: 05/05/15  6:39 AM  Result Value Ref Range   MRSA by PCR NEGATIVE NEGATIVE    Comment:        The GeneXpert MRSA Assay (FDA approved for NASAL specimens only), is one component of a comprehensive MRSA colonization surveillance program. It is not intended to diagnose MRSA infection nor to guide or monitor treatment for MRSA infections.   Troponin I-serum (0, 3, 6 hours)     Status: None   Collection Time: 05/05/15  7:00 AM  Result Value Ref Range   Troponin I <0.03 <0.031 ng/mL    Comment:        NO INDICATION OF MYOCARDIAL INJURY.   Basic metabolic panel      Status: Abnormal   Collection Time: 05/05/15  7:00 AM  Result Value Ref Range   Sodium 134 (L) 135 - 145 mmol/L   Potassium 3.8 3.5 - 5.1 mmol/L   Chloride 94 (L) 101 - 111 mmol/L   CO2 26 22 - 32 mmol/L   Glucose, Bld 156 (H) 65 - 99 mg/dL   BUN 34 (H) 6 - 20 mg/dL   Creatinine, Ser 8.63 (H) 0.61 - 1.24 mg/dL   Calcium 9.1 8.9 - 10.3 mg/dL   GFR calc non Af Amer 6 (L) >60 mL/min   GFR calc Af Amer 7 (L) >60 mL/min    Comment: (NOTE) The eGFR has been calculated using the CKD EPI equation. This calculation has not been validated in all clinical situations. eGFR's persistently <60 mL/min signify possible Chronic Kidney Disease.    Anion gap 14 5 - 15  Glucose, capillary     Status: Abnormal   Collection Time: 05/05/15  7:25 AM  Result Value Ref Range   Glucose-Capillary 123 (H) 65 - 99 mg/dL  Glucose, capillary     Status: Abnormal   Collection Time: 05/05/15  8:35 AM  Result Value Ref Range   Glucose-Capillary 153 (H) 65 - 99 mg/dL   Comment 1 Notify RN     IMAGING: Dg Chest 2 View  05/05/2015   CLINICAL DATA:  49 year old male with chest pain and shortness of breath  EXAM: CHEST  2 VIEW  COMPARISON:  Chest radiograph dated 01/06/2013  FINDINGS: The heart size and mediastinal contours are within normal limits. Both lungs are clear. The visualized skeletal structures are unremarkable.  IMPRESSION: No active cardiopulmonary  disease.   Electronically Signed   By: Anner Crete M.D.   On: 05/05/2015 00:52    HOSPITAL DIAGNOSES: Principal Problem:   Hyperglycemia Active Problems:   End stage renal disease   Chest pain   Diabetes mellitus with renal manifestation   IMPRESSION: 1. Atypical, likely musculoskeletal chest pain  RECOMMENDATION: 1. Mr. Dahlen is describing what sounds like atypical, musculoskeletal chest pain which is worse with positional changes and left arm movement. Troponins of been negative 2. He had a negative Myoview in 2014 and a low risk stress  echocardiogram in 2015. This by cardiac risk factors, the symptoms are not typical for angina. No further cardiac workup is necessary at this time. I recommend follow-up with Dr. Marlou Porch in the office after discharge.  Thanks for the consultation.  Time Spent Directly with Patient: 30 minutes  Pixie Casino, MD, [facility] Attending Cardiologist [place] 05/05/2015, 9:21 AM

## 2015-05-05 NOTE — H&P (Signed)
[facility] History and Physical  ELLIS COWELL [id number] DOB: 1966/06/20 DOA: 05/05/2015  Referring physician: EDP PCP: Cathlean Cower, MD   Chief Complaint: Chest pain   HPI: [name] is a 49 y.o. male h/o DM2, ESRD on dialysis MWF.  Patient presents to the ED with sudden onset chest pain.  Located central left chest.  Onset at 6pm this afternoon.  CP worse with strenuous activity or palpation of his left chest.  Had dialysis earlier in the day prior to onset.  Does have pain radiation to left arm and SOB.  Patient does have a negative stress echo done in sept of last year at [facility] (results in care everywhere tab on Epic) as part of his work up as candidate for kidney transplant.  Regarding his DM2 he is currently "diet" controlled prior to today (although he is drinking a non-diet ginger ale in the room).  His BGL today is 824.  Review of Systems: Systems reviewed.  As above, otherwise negative  Past Medical History  Diagnosis Date  . Chills     at night - sometimes  . Fatigue     loss of fatigue  . MRSA (methicillin resistant staph aureus) culture positive   . Poor circulation   . Renal disorder   . Complication of anesthesia   . PONV (postoperative nausea and vomiting)   . Heart murmur     years ago  . History of blood transfusion   . Eczema   . Headache(784.0)     Years ago  . Hypertension     sees Dr. Jaci Standard  . Diabetes mellitus     controlled by diet  . 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 Av fistula placement  06-18-11    Right brachiocephalic AVF  . Soft tissue mass excision      Right arm, Left leg  for MRSA infection  . Eye surgery      left eye for Laser, diabetic retinopathy  . Hematoma evacuation Right Feb. 25, 2014  . Revison of arteriovenous fistula Right 12/14/2012    Procedure: REVISON OF upper arm ARTERIOVENOUS FISTULA using 51mmx10cm gortex graft;  Surgeon: Conrad Monroe,  MD;  Location: [place];  Service: Vascular;  Laterality: Right;  . Insertion of dialysis catheter  12/14/2012    Procedure: INSERTION OF DIALYSIS CATHETER;  Surgeon: Conrad [place], MD;  Location: [name];  Service: Vascular;;  . Hematoma evacuation Right 12/14/2012    Procedure: EVACUATION HEMATOMA;  Surgeon: Conrad [place]-Leto, MD;  Location: [place];  Service: Vascular;  Laterality: Right;  . Fistulogram Right 11/06/2011    Procedure: FISTULOGRAM;  Surgeon: Conrad Glendon, MD;  [facility];  Service: Cardiovascular;  Laterality: Right;   Social History:  reports that he has never smoked. He has never used smokeless tobacco. He reports that he does not drink alcohol or use illicit drugs.  Allergies  Allergen Reactions  . Amoxicillin Itching  . Hydromorphone Itching    Patient states he may take with benadryl. Makes feet itch.  Marland Kitchen Percocet [Oxycodone-Acetaminophen] Itching and Other (See Comments)    Legs only. Itching and burning feeling.    Family History  Problem Relation Age of Onset  . Diabetes Mother      Prior to Admission medications   Medication Sig Start Date End Date Taking? Authorizing Provider  sevelamer carbonate (RENVELA) 800 MG tablet Take 800 mg by mouth  3 (three) times daily with meals.   Yes Historical Provider, MD  metoCLOPramide (REGLAN) 10 MG tablet Take 1 tablet (10 mg total) by mouth every 6 (six) hours as needed (Headache or nausea). Patient not taking: Reported on 05/05/2015 07/05/14   Tanna Furry, MD   Physical Exam: Filed Vitals:   05/05/15 0230  BP: 149/83  Pulse: 87  Temp:   Resp: 18    BP 149/83 mmHg  Pulse 87  Temp(Src) 97.7 F (36.5 C) (Oral)  Resp 18  SpO2 100%  General Appearance:    Alert, oriented, no distress, appears stated age  Head:    Normocephalic, atraumatic  Eyes:    PERRL, EOMI, sclera non-icteric        Nose:   Nares without drainage or epistaxis. Mucosa, turbinates normal  Throat:   Moist mucous membranes. Oropharynx without  erythema or exudate.  Neck:   Supple. No carotid bruits.  No thyromegaly.  No lymphadenopathy.   Back:     No CVA tenderness, no spinal tenderness  Lungs:     Clear to auscultation bilaterally, without wheezes, rhonchi or rales  Chest wall:    No tenderness to palpitation  Heart:    Regular rate and rhythm without murmurs, gallops, rubs  Abdomen:     Soft, non-tender, nondistended, normal bowel sounds, no organomegaly  Genitalia:    deferred  Rectal:    deferred  Extremities:   No clubbing, cyanosis or edema.  Pulses:   2+ and symmetric all extremities  Skin:   Skin color, texture, turgor normal, no rashes or lesions  Lymph nodes:   Cervical, supraclavicular, and axillary nodes normal  Neurologic:   CNII-XII intact. Normal strength, sensation and reflexes      throughout    Labs on Admission:  Basic Metabolic Panel:  Recent Labs Lab 05/05/15 0019  NA 124*  K 5.5*  CL 86*  CO2 26  GLUCOSE 824*  BUN 32*  CREATININE 7.80*  CALCIUM 8.5*   Liver Function Tests: No results for input(s): AST, ALT, ALKPHOS, BILITOT, PROT, ALBUMIN in the last 168 hours. No results for input(s): LIPASE, AMYLASE in the last 168 hours. No results for input(s): AMMONIA in the last 168 hours. CBC:  Recent Labs Lab 05/05/15 0019  WBC 7.8  HGB 10.5*  HCT 32.1*  MCV 90.7  PLT 247   Cardiac Enzymes: No results for input(s): CKTOTAL, CKMB, CKMBINDEX, TROPONINI in the last 168 hours.  BNP (last 3 results) No results for input(s): PROBNP in the last 8760 hours. CBG:  Recent Labs Lab 05/05/15 0116  GLUCAP >600*    Radiological Exams on Admission: Dg Chest 2 View  05/05/2015   CLINICAL DATA:  49 year old male with chest pain and shortness of breath  EXAM: CHEST  2 VIEW  COMPARISON:  Chest radiograph dated 01/06/2013  FINDINGS: The heart size and mediastinal contours are within normal limits. Both lungs are clear. The visualized skeletal structures are unremarkable.  IMPRESSION: No active  cardiopulmonary disease.   Electronically Signed   By: Anner Crete M.D.   On: 05/05/2015 00:52    EKG: Independently reviewed.  Assessment/Plan Principal Problem:   Hyperglycemia Active Problems:   End stage renal disease   Chest pain   Diabetes mellitus with renal manifestation   1. Hyperglycemia - BGL 824 1. glucostabilizer 2. Q4H BMPs 3. Replace K PRN (currently slightly high at 5.5) 4. DM coordinator consult 5. Very light hydration given ESRD 2. ESRD -  1. Dialysis  MWF 2. Call nephrology if patient remains in hospital through Sunday for Monday dialysis 3. Chest pain - 1. Chest pain obs pathway 2. Serial trops 3. Tele monitor 4. Does have risk factors but also has negative stress test 10 months ago. 5. Consider talking with cards once issue #1 fixed above 6. Currently pain free    Code Status: Full Code  Family Communication: Family at bedside Disposition Plan: Admit to inpatient   Time spent: 68 min  GARDNER, [name] M. [facility] Pager [phone]  If 7AM-7PM, please contact the day team taking care of the patient [email or web address] Password [email or web address] 05/05/2015, 3:02 AM      ] \

## 2015-05-05 NOTE — ED Notes (Signed)
C/o pain to center of chest and L chest since 6pm with sob and nausea.  States pain is relieved at this time but returns with movement and exertion.

## 2015-05-05 NOTE — ED Notes (Addendum)
Admitting physician at bedside

## 2015-05-05 NOTE — Progress Notes (Addendum)
Inpatient Diabetes Program Recommendations  AACE/ADA: New Consensus Statement on Inpatient Glycemic Control (2013)  Target Ranges:  Prepandial:   less than 140 mg/dL      Peak postprandial:   less than 180 mg/dL (1-2 hours)      Critically ill patients:  140 - 180 mg/dL   Reason for Admission: CP/Hyperglycemia  Diabetes history: DM 2 Outpatient Diabetes medications: Diet Controlled Current orders for Inpatient glycemic control: IV insulin  Inpatient Diabetes Program Recommendations Insulin - Basal: When the patient is appropriate to transition off IV insulin, please consider starting with Levemir 18-20 units (less than 0.2 units/kg) HgbA1C: Due to admission glucose of 824, please consider ordering an A1c to assess glucose control over the past 2-3 months.  Note: Will call patient today  1115 am: Called patient to discuss glucose control at home. Patient reports that he goes to the clinic for management but he has not been in awhile. He also mentions that he use to be vegan and also weighed over 300 pounds. He lost weight and really controlled his glucose levels through his diet. He also was walking 5 miles a day and was very active. Here recently he stopped being so active and was not eating very well. He reports he knows what to do . He does mention that he does need a meter to better monitor his progress. Patient reports he was on a cloudy insulin (70/30) and had been on Humalog. He is familiar with how to administer insulin. Patient reports he has Medicare and has a Nurse, mental health supplement. Spoke with patient about being discharged on insulin for a short time until he can control his glucose through diet and exercise. Discussed with patient that A1c is ordered to be drawn and to find out the results before discharge if available.  MD Please order glucose meter kit at discharge (order # [id number])  Thanks,  [name] Headings RN, MSN, [facility] Inpatient Diabetes Coordinator Team Pager  [phone]

## 2015-05-05 NOTE — ED Notes (Signed)
IV Team at bedside 

## 2015-05-05 NOTE — ED Notes (Signed)
MD at bedside. 

## 2015-05-05 NOTE — Progress Notes (Signed)
PROGRESS NOTE    [name] [id number] DOB: June 26, 1966 DOA: 05/05/2015 PCP: Cathlean Cower, MD  HPI/Brief narrative 49 year old male patient with history of ESRD on MWF HD (dialyzed 05/04/15), type II DM (used to be on Lantus & Humalog and then on glimepiride while in [place] up to 2008-self discontinued-states that he used to have some low blood sugars) which she was trying to control by diet and exercise but has been noncompliant with this for greater than 2 years, has not checked his CBGs in greater than 2 years, HTN, presented with sharp left-sided chest pain with radiating to left shoulder. Patient gives history of strenuous activity/lifting lawn mover and mowing with brother a day prior. Transient dyspnea which resolved. In the ED, blood glucose 824, sodium 124, potassium 5.5, creatinine 7.8, hemoglobin 10.5 and normal anion gap. He was placed on insulin drip and admitted for management of poorly controlled type II DM and chest pain evaluation. Cardiology has seen and cleared-do not recommend any workup at this time.   Assessment/Plan:   Atypical/musculoskeletal type chest pain - Likely brought on by physical activity i.e. lifting and mowing lawn day prior chest pain onset - EKG without acute changes. Troponin 3 negative. - Cardiology consultation appreciated: Nuclear stress test 2014 negative. Stress echo last year as part of transplant workup negative. No further cardiac workup recommended. Recommend outpatient follow-up with Dr. Marlou Porch after discharge. - Chest pain resolved.  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 No DKA. - Improved after IV insulin drip. Transitioned to Lantus 18 units daily and NovoLog SSI. - Monitor additional 24 hours and make necessary adjustments to insulin's. - Patient has been counseled extensively regarding importance of compliance with  diet, medications and PCP follow-up.  - Diabetes education. - Follow A1c.  Hyponatremia - Most likely secondary to hyperglycemia. Improved after diabetes controlled  ESRD on MWF HD - Nephrology consulted  Essential hypertension - Mildly uncontrolled.  Mild hyperkalemia - Resolved.  Chronic anemia in CKD - Stable.   DVT prophylaxis: Subcutaneous heparin. Code Status:  Full Family Communication:  none at bedside Disposition Plan:  DC home possibly 7/17.   Consultants:  Cardiology  Nephrology  Procedures:   None  Antibiotics:   None   Subjective:  no further chest pains. Denies complaints.  Objective: Filed Vitals:   05/05/15 0018 05/05/15 0100 05/05/15 0230 05/05/15 0358  BP: 168/90 166/84 149/83 146/75  Pulse: 95 93 87 91  Temp: 97.7 F (36.5 C)   97.8 F (36.6 C)  TempSrc: Oral   Oral  Resp: 16 17 18 18   Height:    5\' 11"  (1.803 m)  Weight:    110.904 kg (244 lb 8 oz)  SpO2: 100% 100% 100% 100%   No intake or output data in the 24 hours ending 05/05/15 1302 Filed Weights   05/05/15 0358  Weight: 110.904 kg (244 lb 8 oz)     Exam:  General exam:  pleasant [place] male sitting up comfortably in bed. Respiratory system: Clear. No increased work of breathing. Cardiovascular system: S1 & S2 heard, RRR. No JVD, murmurs, gallops, clicks or pedal edema. telemetry: Sinus rhythm  Gastrointestinal system: Abdomen is nondistended, soft and nontender. Normal bowel sounds heard. Central nervous system: Alert and oriented. No focal neurological deficits. Extremities: Symmetric 5 x 5 power.   Data Reviewed: Basic Metabolic Panel:  Recent Labs Lab 05/05/15 0019 05/05/15  0700  NA 124* 134*  K 5.5* 3.8  CL 86* 94*  CO2 26 26  GLUCOSE 824* 156*  BUN 32* 34*  CREATININE 7.80* 8.63*  CALCIUM 8.5* 9.1   Liver Function Tests: No results for input(s): AST, ALT, ALKPHOS, BILITOT, PROT, ALBUMIN in the last 168 hours. No results for input(s): LIPASE,  AMYLASE in the last 168 hours. No results for input(s): AMMONIA in the last 168 hours. CBC:  Recent Labs Lab 05/05/15 0019  WBC 7.8  HGB 10.5*  HCT 32.1*  MCV 90.7  PLT 247   Cardiac Enzymes:  Recent Labs Lab 05/05/15 0430 05/05/15 0700 05/05/15 0949  TROPONINI <0.03 <0.03 <0.03   BNP (last 3 results) No results for input(s): PROBNP in the last 8760 hours. CBG:  Recent Labs Lab 05/05/15 0835 05/05/15 0933 05/05/15 1036 05/05/15 1147 05/05/15 1237  GLUCAP 153* 121* 146* 155* 117*    Recent Results (from the past 240 hour(s))  MRSA PCR Screening     Status: None   Collection Time: 05/05/15  6:39 AM  Result Value Ref Range Status   MRSA by PCR NEGATIVE NEGATIVE Final    Comment:        The GeneXpert MRSA Assay (FDA approved for NASAL specimens only), is one component of a comprehensive MRSA colonization surveillance program. It is not intended to diagnose MRSA infection nor to guide or monitor treatment for MRSA infections.          Studies: Dg Chest 2 View  05/05/2015   CLINICAL DATA:  49 year old male with chest pain and shortness of breath  EXAM: CHEST  2 VIEW  COMPARISON:  Chest radiograph dated 01/06/2013  FINDINGS: The heart size and mediastinal contours are within normal limits. Both lungs are clear. The visualized skeletal structures are unremarkable.  IMPRESSION: No active cardiopulmonary disease.   Electronically Signed   By: Anner Crete M.D.   On: 05/05/2015 00:52        Scheduled Meds: . heparin  5,000 Units Subcutaneous 3 times per day  . insulin aspart  0-5 Units Subcutaneous QHS  . insulin aspart  0-9 Units Subcutaneous TID WC  . insulin glargine  18 Units Subcutaneous Daily  . insulin regular  0-10 Units Intravenous TID WC   Continuous Infusions: . sodium chloride 75 mL/hr at 05/05/15 0508  . dextrose 5 % and 0.45% NaCl 50 mL/hr at 05/05/15 0637  . insulin (NOVOLIN-R) infusion 0.5 Units/hr (05/05/15 1040)    Principal  Problem:   Hyperglycemia Active Problems:   End stage renal disease   Chest pain   Diabetes mellitus with renal manifestation    Time spent:  40 minutes    [name],ANAND, MD, FACP, FHM. [facility] Pager [phone]  If 7PM-7AM, please contact night-coverage [email or web address] Password [email or web address] 05/05/2015, 1:02 PM    LOS: 0 days

## 2015-05-05 NOTE — ED Provider Notes (Signed)
TIME SEEN: 12:28 AM  CHIEF COMPLAINT: Chest pain  HPI:  HPI Comments: [name] is a 49 y.o. male with hx renal disorder on dialysis MWF who presents to the Emergency Department complaining of sudden onset, central chest pain that began this afternoon around 6 PM (approximately 6.5 hours ago). He notes that the chest pain is exacerbated with strenuous activity. Pt states that he felt fine upon waking up this morning. Reports he had full dialysis yesterday. Pt notes pain radiating to his left arm. He also complains of shortness of breath, mild diaphoresis, nausea, and vomiting. He notes that he has been coughing recently as well. Pt states he had stress test recently for kidney transplant workup with no acute findings. He has never had cardiac catheterization. Denies fever or any other symptoms. He is nonsmoker. Pt is unsure if there is a positive fhx of cardiac issues.   ROS: See HPI Constitutional: Diaphoresis. no fever  Eyes: no drainage  ENT: no runny nose   Cardiovascular:  chest pain  Resp: SOB  GI: Nausea. vomiting GU: no dysuria Integumentary: no rash  Allergy: no hives  Musculoskeletal: no leg swelling  Neurological: no slurred speech ROS otherwise negative  PAST MEDICAL HISTORY/PAST SURGICAL HISTORY:  Past Medical History  Diagnosis Date  . Chills     at night - sometimes  . Fatigue     loss of fatigue  . MRSA (methicillin resistant staph aureus) culture positive   . Poor circulation   . Renal disorder   . Complication of anesthesia   . PONV (postoperative nausea and vomiting)   . Heart murmur     years ago  . History of blood transfusion   . Eczema   . Headache(784.0)     Years ago  . Hypertension     sees Dr. Jaci Standard  . Diabetes mellitus     controlled by diet  . Diabetes with renal manifestations(250.4) 08/23/2013    MEDICATIONS:  Prior to Admission medications   Medication Sig Start Date End Date Taking? Authorizing Provider  calcium acetate (PHOSLO)  667 MG capsule Take 667 mg by mouth 3 (three) times daily.    Historical Provider, MD  FOSRENOL 1000 MG chewable tablet Chew 1,000 mg by mouth 3 (three) times daily with meals. 03/30/14   Historical Provider, MD  metoCLOPramide (REGLAN) 10 MG tablet Take 1 tablet (10 mg total) by mouth every 6 (six) hours as needed (Headache or nausea). 07/05/14   Tanna Furry, MD  sevelamer carbonate (RENVELA) 800 MG tablet Take 800 mg by mouth 3 (three) times daily with meals.    Historical Provider, MD    ALLERGIES:  Allergies  Allergen Reactions  . Amoxicillin Itching  . Hydromorphone Itching    Patient states he may take with benadryl. Makes feet itch.  Marland Kitchen Percocet [Oxycodone-Acetaminophen] Itching and Other (See Comments)    Legs only. Itching and burning feeling.    SOCIAL HISTORY:  History  Substance Use Topics  . Smoking status: Never Smoker   . Smokeless tobacco: Never Used  . Alcohol Use: No    FAMILY HISTORY: Family History  Problem Relation Age of Onset  . Diabetes Mother     EXAM: Triage Vitals: BP 168/90 mmHg  Pulse 95  Temp(Src) 97.7 F (36.5 C) (Oral)  Resp 16  SpO2 100%   CONSTITUTIONAL: Alert and oriented and responds appropriately to questions. Well-appearing; well-nourished HEAD: Normocephalic EYES: Conjunctivae clear, PERRL ENT: normal nose; no rhinorrhea; moist mucous membranes; pharynx  without lesions noted NECK: Supple, no meningismus, no LAD  CARD: RRR; S1 and S2 appreciated; no murmurs, no clicks, no rubs, no gallops RESP: Normal chest excursion without splinting or tachypnea; breath sounds clear and equal bilaterally; no wheezes, no rhonchi, no rales, no hypoxia or respiratory distress, speaking full sentences ABD/GI: Normal bowel sounds; non-distended; soft, non-tender, no rebound, no guarding, no peritoneal signs BACK:  The back appears normal and is non-tender to palpation, there is no CVA tenderness EXT: Normal ROM in all joints; non-tender to palpation; no  edema; normal capillary refill; no cyanosis, no calf tenderness or swelling. Right upper extremity fistula with good thrill and bruit. 2+ radial pulse on right side.  SKIN: Normal color for age and race; warm NEURO: Moves all extremities equally, sensation to light touch intact diffusely, cranial nerves II through XII intact PSYCH: The patient's mood and manner are appropriate. Grooming and personal hygiene are appropriate.  MEDICAL DECISION MAKING: Patient here with chest pain. Had recent negative stress test but has multiple risk factors for ACS. We'll obtain cardiac labs, chest x-ray. He is currently chest pain-free. We'll give aspirin.  ED PROGRESS: Patient's labs show glucose of 824. Normal anion gap. Troponin negative. We'll start insulin drip. He is not in DKA. Discussed with Dr. Alcario Drought with hospitalist service for admission. Will admit to step down.   EKG Interpretation  Date/Time:  Saturday May 05 2015 00:07:00 EDT Ventricular Rate:  97 PR Interval:  156 QRS Duration: 98 QT Interval:  358 QTC Calculation: [id number] R Axis:   21 Text Interpretation:  Normal sinus rhythm Minimal voltage criteria for LVH, may be normal variant Borderline ECG No significant change since last tracing Confirmed by Erdine Hulen,  DO, Harlan Vinal [id number]) on 05/05/2015 12:22:42 AM        CRITICAL CARE Performed by: Nyra Jabs   Total critical care time: 35 minutes  Critical care time was exclusive of separately billable procedures and treating other patients.  Critical care was necessary to treat or prevent imminent or life-threatening deterioration.  Critical care was time spent personally by me on the following activities: development of treatment plan with patient and/or surrogate as well as nursing, discussions with consultants, evaluation of patient's response to treatment, examination of patient, obtaining history from patient or surrogate, ordering and performing treatments and interventions, ordering  and review of laboratory studies, ordering and review of radiographic studies, pulse oximetry and re-evaluation of patient's condition.  I personally performed the services described in this documentation, which was scribed in my presence. The recorded information has been reviewed and is accurate.    [place], DO 05/05/15 [phone]

## 2015-05-05 NOTE — Progress Notes (Signed)
Utilization Review Completed.  

## 2015-05-06 DIAGNOSIS — R0789 Other chest pain: Principal | ICD-10-CM

## 2015-05-06 DIAGNOSIS — E1129 Type 2 diabetes mellitus with other diabetic kidney complication: Secondary | ICD-10-CM

## 2015-05-06 DIAGNOSIS — E1165 Type 2 diabetes mellitus with hyperglycemia: Secondary | ICD-10-CM

## 2015-05-06 DIAGNOSIS — D631 Anemia in chronic kidney disease: Secondary | ICD-10-CM

## 2015-05-06 DIAGNOSIS — N189 Chronic kidney disease, unspecified: Secondary | ICD-10-CM

## 2015-05-06 DIAGNOSIS — Z992 Dependence on renal dialysis: Secondary | ICD-10-CM

## 2015-05-06 DIAGNOSIS — N186 End stage renal disease: Secondary | ICD-10-CM

## 2015-05-06 LAB — GLUCOSE, CAPILLARY: GLUCOSE-CAPILLARY: 127 mg/dL — AB (ref 65–99)

## 2015-05-06 MED ORDER — INSULIN DETEMIR 100 UNIT/ML ~~LOC~~ SOLN: 18.0000 [IU] | Freq: Every day | SUBCUTANEOUS | Status: DC

## 2015-05-06 MED ORDER — BLOOD GLUCOSE METER KIT: PACK | Status: DC

## 2015-05-06 MED ORDER — INSULIN ASPART 100 UNIT/ML ~~LOC~~ SOLN: 0.0000 [IU] | Freq: Three times a day (TID) | SUBCUTANEOUS | Status: DC

## 2015-05-06 NOTE — Progress Notes (Signed)
Pt discharge education instructions completed with pt and spouse at bedside; both voices understanding and denies any questions. Pt IV and telemetry removed; pt handed his prescriptions for glucose meter; insulin detemir and insulin aspart. Pt discharge home with spouse to transport him home. Pt transported off unit via wheelchair with wife and belongings to the side. [name] Gaines Zakary Kimura RN.

## 2015-05-06 NOTE — Discharge Instructions (Signed)

## 2015-05-06 NOTE — Discharge Summary (Addendum)
Physician Discharge Summary  DAVIEON STOCKHAM ZRA:[id number] DOB: 20-Dec-1965 DOA: 05/05/2015  PCP: Cathlean Cower, MD  Admit date: 05/05/2015 Discharge date: 05/06/2015  Time spent: Greater than 30 minutes  Recommendations for Outpatient Follow-up:  1. Dr. Candee Furbish, Cardiology 2. Hemodialysis Center: patient advised to keep appointment for HD on MWF 3. [facility] in 5-7 days with repeat labs (CBC & BMP). Please follow up A1C that was sent from the hospital. CM assisting with appointment and medications. 4. Dr. Cathlean Cower, PCP: has not seen in a while and he will try to make appointment with him.  Discharge Diagnoses:  Principal Problem:   Hyperglycemia Active Problems:   End stage renal disease   Chest pain   Diabetes mellitus with renal manifestation   Discharge Condition: Improved & Stable  Diet recommendation: Heart Healthy & Diabetic diet.  Filed Weights   05/05/15 0358 05/06/15 0630  Weight: 110.904 kg (244 lb 8 oz) 110.8 kg (244 lb 4.3 oz)    History of present illness:  49 year old male patient with history of ESRD on MWF HD (dialyzed 05/04/15), type II DM (used to be on Lantus & Humalog and then on glimepiride while in [place] up to 2008-self discontinued-states that he used to have some low blood sugars) which she was trying to control by diet and exercise but has been noncompliant with this for greater than 2 years, has not checked his CBGs in greater than 2 years, HTN, presented with sharp left-sided chest pain with radiating to left shoulder. Patient gives history of strenuous activity/lifting lawn mover and mowing with brother a day prior. Transient dyspnea which resolved. In the ED, blood glucose 824, sodium 124, potassium 5.5, creatinine 7.8, hemoglobin 10.5 and normal anion gap. He was placed on insulin drip and admitted for management of poorly controlled type II DM and chest pain evaluation. Cardiology has seen and cleared-do not  recommend any workup at this time.  Hospital Course:   Atypical/musculoskeletal type chest pain - Likely brought on by physical activity i.e. lifting and mowing lawn day prior chest pain onset - EKG without acute changes. Troponin 3 negative. - Cardiology consultation appreciated: Nuclear stress test 2014 negative. Stress echo last year as part of transplant workup negative. No further cardiac workup recommended. Recommend outpatient follow-up with Dr. Marlou Porch after discharge. - Chest pain resolved.  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 No DKA. - Improved after IV insulin drip. Transitioned to Lantus 18 units daily and NovoLog SSI. - Patient has been counseled extensively regarding importance of compliance with diet, medications and PCP follow-up.  - Diabetes education. Diabetes Coordinator input appreciated. - Follow A1c: pending. - Reasonable inpatient control. - Will DC on Levemir (advised by DM coordinator) and Novolog SSI. Patient is well versed with all aspects of DM Mx including Insulin Mx.   Hyponatremia - Most likely secondary to hyperglycemia. Improved after diabetes controlled  ESRD on MWF HD - No need for Nephrology consult in patient. No acute HD needs. He can follow OP re his regular HD needs on MWF.  Essential hypertension - reasonably controlled.  Mild hyperkalemia - Resolved.  Chronic anemia in CKD - Stable.   Consultants:  Cardiology  Procedures:  None  Discharge Exam:  Complaints: Denies complaints and anxious to go home. Told nurse that if he was not discharged today, he would sign out AMA.  Filed Vitals:   05/05/15 1328 05/05/15 2104 05/06/15 0427 05/06/15 0630  BP: 117/73 137/89 154/84   Pulse: 88 85 85   Temp: 98 F (36.7 C) 98.3 F (36.8 C) 98.1 F (36.7 C)   TempSrc: Oral Oral Oral   Resp: '19 16 18   ' Height:       Weight:    110.8 kg (244 lb 4.3 oz)  SpO2: 100% 100% 98%     General exam: pleasant [place] male sitting up comfortably in chair this morning. Respiratory system: Clear. No increased work of breathing. Cardiovascular system: S1 & S2 heard, RRR. No JVD, murmurs, gallops, clicks or pedal edema. telemetry: Sinus rhythm  Gastrointestinal system: Abdomen is nondistended, soft and nontender. Normal bowel sounds heard. Central nervous system: Alert and oriented. No focal neurological deficits. Extremities: Symmetric 5 x 5 power.  Discharge Instructions      Discharge Instructions    Activity as tolerated - No restrictions    Complete by:  As directed      Call MD for:  difficulty breathing, headache or visual disturbances    Complete by:  As directed      Call MD for:  extreme fatigue    Complete by:  As directed      Call MD for:  persistant dizziness or light-headedness    Complete by:  As directed      Call MD for:  persistant nausea and vomiting    Complete by:  As directed      Call MD for:  severe uncontrolled pain    Complete by:  As directed      Call MD for:  temperature >100.4    Complete by:  As directed      Diet - low sodium heart healthy    Complete by:  As directed      Diet Carb Modified    Complete by:  As directed             Medication List    STOP taking these medications        metoCLOPramide 10 MG tablet  Commonly known as:  REGLAN      TAKE these medications        blood glucose meter kit and supplies  Dispense based on patient and insurance preference. Use up to four times daily as directed. (FOR ICD-9 250.00, 250.01).     insulin aspart 100 UNIT/ML injection  Commonly known as:  novoLOG  Inject 0-9 Units into the skin 3 (three) times daily with meals. CBG < 70: eat or drink something sweet and recheck, CBG 70 - 120: 0 units CBG 121 - 150: 1 unit CBG 151 - 200: 2 units CBG 201 - 250: 3 units CBG 251 - 300: 5 units CBG 301 - 350: 7 units CBG 351  - 400: 9 units CBG > 400: call MD.     insulin detemir 100 UNIT/ML injection  Commonly known as:  LEVEMIR  Inject 0.18 mLs (18 Units total) into the skin daily.  Start taking on:  05/07/2015     sevelamer carbonate 800 MG tablet  Commonly known as:  RENVELA  Take 800 mg by mouth 3 (three) times daily with meals.       Follow-up Information    Schedule an appointment as soon as possible for a visit with Candee Furbish, MD.   Specialty:  Cardiology   Contact information:   [address] [place] [address] [phone]  Follow up with [facility].   Why:  Keep regular dialysis appointments on Mondays/Weds/Fridays.      Follow up with [facility]    .   Why:  To be seen in 5-7 days with repeat labs (CBC & BMP).   Contact information:   [address] [place] [address] [phone]       The results of significant diagnostics from this hospitalization (including imaging, microbiology, ancillary and laboratory) are listed below for reference.    Significant Diagnostic Studies: Dg Chest 2 View  05/05/2015   CLINICAL DATA:  49 year old male with chest pain and shortness of breath  EXAM: CHEST  2 VIEW  COMPARISON:  Chest radiograph dated 01/06/2013  FINDINGS: The heart size and mediastinal contours are within normal limits. Both lungs are clear. The visualized skeletal structures are unremarkable.  IMPRESSION: No active cardiopulmonary disease.   Electronically Signed   By: Anner Crete M.D.   On: 05/05/2015 00:52    Microbiology: Recent Results (from the past 240 hour(s))  MRSA PCR Screening     Status: None   Collection Time: 05/05/15  6:39 AM  Result Value Ref Range Status   MRSA by PCR NEGATIVE NEGATIVE Final    Comment:        The GeneXpert MRSA Assay (FDA approved for NASAL specimens only), is one component of a comprehensive MRSA colonization surveillance program. It is  not intended to diagnose MRSA infection nor to guide or monitor treatment for MRSA infections.      Labs: Basic Metabolic Panel:  Recent Labs Lab 05/05/15 0019 05/05/15 0700  NA 124* 134*  K 5.5* 3.8  CL 86* 94*  CO2 26 26  GLUCOSE 824* 156*  BUN 32* 34*  CREATININE 7.80* 8.63*  CALCIUM 8.5* 9.1   Liver Function Tests: No results for input(s): AST, ALT, ALKPHOS, BILITOT, PROT, ALBUMIN in the last 168 hours. No results for input(s): LIPASE, AMYLASE in the last 168 hours. No results for input(s): AMMONIA in the last 168 hours. CBC:  Recent Labs Lab 05/05/15 0019  WBC 7.8  HGB 10.5*  HCT 32.1*  MCV 90.7  PLT 247   Cardiac Enzymes:  Recent Labs Lab 05/05/15 0430 05/05/15 0700 05/05/15 0949  TROPONINI <0.03 <0.03 <0.03   BNP: BNP (last 3 results) No results for input(s): BNP in the last 8760 hours.  ProBNP (last 3 results) No results for input(s): PROBNP in the last 8760 hours.  CBG:  Recent Labs Lab 05/05/15 1147 05/05/15 1237 05/05/15 1615 05/05/15 2103 05/06/15 0628  GLUCAP 155* 117* 156* 154* 127*        Signed:  Vernell Leep, MD, FACP, FHM. [facility] Pager [phone]  If 7PM-7AM, please contact night-coverage [email or web address] Password [facility] 05/06/2015, 12:05 PM

## 2015-05-07 DIAGNOSIS — N186 End stage renal disease: Secondary | ICD-10-CM | POA: Diagnosis not present

## 2015-05-07 DIAGNOSIS — N2581 Secondary hyperparathyroidism of renal origin: Secondary | ICD-10-CM | POA: Diagnosis not present

## 2015-05-07 DIAGNOSIS — D631 Anemia in chronic kidney disease: Secondary | ICD-10-CM | POA: Diagnosis not present

## 2015-05-07 DIAGNOSIS — E1129 Type 2 diabetes mellitus with other diabetic kidney complication: Secondary | ICD-10-CM | POA: Diagnosis not present

## 2015-05-07 LAB — HEMOGLOBIN A1C
Hgb A1c MFr Bld: 8.8 % — ABNORMAL HIGH (ref 4.8–5.6)
MEAN PLASMA GLUCOSE: 206 mg/dL

## 2015-05-09 DIAGNOSIS — N2581 Secondary hyperparathyroidism of renal origin: Secondary | ICD-10-CM | POA: Diagnosis not present

## 2015-05-09 DIAGNOSIS — N186 End stage renal disease: Secondary | ICD-10-CM | POA: Diagnosis not present

## 2015-05-09 DIAGNOSIS — D631 Anemia in chronic kidney disease: Secondary | ICD-10-CM | POA: Diagnosis not present

## 2015-05-09 DIAGNOSIS — E1129 Type 2 diabetes mellitus with other diabetic kidney complication: Secondary | ICD-10-CM | POA: Diagnosis not present

## 2015-05-10 ENCOUNTER — Ambulatory Visit: Payer: Medicare Other | Attending: Family Medicine | Admitting: Family Medicine

## 2015-05-10 ENCOUNTER — Encounter: Payer: Self-pay | Admitting: Family Medicine

## 2015-05-10 VITALS — BP 130/87 | HR 87 | Temp 98.2°F | Ht 70.0 in | Wt 235.0 lb

## 2015-05-10 DIAGNOSIS — E1122 Type 2 diabetes mellitus with diabetic chronic kidney disease: Secondary | ICD-10-CM

## 2015-05-10 DIAGNOSIS — E1129 Type 2 diabetes mellitus with other diabetic kidney complication: Secondary | ICD-10-CM | POA: Diagnosis not present

## 2015-05-10 DIAGNOSIS — N189 Chronic kidney disease, unspecified: Secondary | ICD-10-CM

## 2015-05-10 DIAGNOSIS — N185 Chronic kidney disease, stage 5: Secondary | ICD-10-CM

## 2015-05-10 DIAGNOSIS — Z992 Dependence on renal dialysis: Secondary | ICD-10-CM | POA: Diagnosis not present

## 2015-05-10 DIAGNOSIS — I10 Essential (primary) hypertension: Secondary | ICD-10-CM

## 2015-05-10 LAB — LIPID PANEL
Cholesterol: 355 mg/dL — ABNORMAL HIGH (ref 0–200)
HDL: 37 mg/dL — AB (ref >=40)
Total CHOL/HDL Ratio: 9.6 Ratio
Triglycerides: 801 mg/dL — ABNORMAL HIGH (ref <150)

## 2015-05-10 LAB — GLUCOSE, POCT (MANUAL RESULT ENTRY): POC GLUCOSE: 133 mg/dL — AB (ref 70–99)

## 2015-05-10 MED ORDER — GLUCOSE BLOOD VI STRP: ORAL_STRIP | Status: DC

## 2015-05-10 MED ORDER — ACCU-CHEK AVIVA DEVI: Status: AC

## 2015-05-10 MED ORDER — ACCU-CHEK SOFTCLIX LANCET DEV MISC: Status: DC

## 2015-05-10 NOTE — Progress Notes (Signed)
Patient ID: LAMARKUS NEBEL, male   DOB: 1966-08-31, 49 y.o.   MRN: [id number]    Naren Benally, is a 49 y.o. male  YHC:[id number]  DVV:[id number]  DOB - September 08, 1966   Admit date: 05/05/15 Discharge date: 05/06/15  CC:  Chief Complaint  Patient presents with  . Hospitalization Follow-up  . Diabetes       HPI: Lucious Zou is a 49 y.o. male with a history of type 2 diabetes mellitus (who stopped his medications and was attempting diet control), end-stage renal disease on hemodialysis, hypertension who presented to the ED with sharp left-sided chest pain with radiating to left shoulder.  He gave a history of strenuous activity/lifting lawn mover and mowing with brother a day prior. He had transient dyspnea which resolved. In the ED, blood glucose 824, sodium 124, potassium 5.5, creatinine 7.8, hemoglobin 10.5 and normal anion gap. He was placed on insulin drip and admitted for management of poorly controlled type II DM and chest pain evaluation. Troponins were negative 3, EKG showed no acute changes, cardiology consult was placed and due to and negative nuclear stress from 2014 and normal stress echo from 2015 (as part of transplant work up) no further cardiac workup was recommended and chest pain was thought to be atypical versus musculoskeletal. For his diabetes he was placed in an insulin drip and later commenced on Lantus and NovoLog sliding scale. He was seen by the diabetic coordinator, Lantus was switched to Levemir and advised to follow-up in the outpatient clinic.  Interval history: She reports doing fine today and has no complaints at this time. He will need a prescription for new meter and test strips and is motivated to work on his lifestyle changes and lose a few pounds. He is up-to-date with his eye exams but refuses a Pneumovax. Was previously followed by [facility]- Dr Jeneen Rinks who is his PCP but hasn't seen him in 2 years. Patient has No headache, No chest pain, No abdominal pain - No  Nausea, No new weakness tingling or numbness, No Cough - SOB.  Allergies  Allergen Reactions  . Amoxicillin Itching  . Hydromorphone Itching    Patient states he may take with benadryl. Makes feet itch.  Marland Kitchen Percocet [Oxycodone-Acetaminophen] Itching and Other (See Comments)    Legs only. Itching and burning feeling.   Past Medical History  Diagnosis Date  . Chills     at night - sometimes  . Fatigue     loss of fatigue  . MRSA (methicillin resistant staph aureus) culture positive   . Poor circulation   . Renal disorder   . Complication of anesthesia   . PONV (postoperative nausea and vomiting)   . Heart murmur     years ago  . History of blood transfusion   . Eczema   . Headache(784.0)     Years ago  . Hypertension     sees Dr. Jaci Standard  . Diabetes mellitus     controlled by diet  . Diabetes with renal manifestations(250.4) 08/23/2013   Current Outpatient Prescriptions on File Prior to Visit  Medication Sig Dispense Refill  . insulin aspart (NOVOLOG) 100 UNIT/ML injection Inject 0-9 Units into the skin 3 (three) times daily with meals. CBG < 70: eat or drink something sweet and recheck, CBG 70 - 120: 0 units CBG 121 - 150: 1 unit CBG 151 - 200: 2 units CBG 201 - 250: 3 units CBG 251 - 300: 5 units CBG 301 - 350: 7 units  CBG 351 - 400: 9 units CBG > 400: call MD. 10 mL 0  . insulin detemir (LEVEMIR) 100 UNIT/ML injection Inject 0.18 mLs (18 Units total) into the skin daily. 10 mL 0  . sevelamer carbonate (RENVELA) 800 MG tablet Take 800 mg by mouth 3 (three) times daily with meals.    . blood glucose meter kit and supplies Dispense based on patient and insurance preference. Use up to four times daily as directed. (FOR ICD-9 250.00, 250.01). (Patient not taking: Reported on 05/10/2015) 1 each 0   No current facility-administered medications on file prior to visit.   Family History  Problem Relation Age of Onset  . Diabetes Mother    History   Social History  .  Marital Status: Married    Spouse Name: N/A  . Number of Children: N/A  . Years of Education: N/A   Occupational History  . Not on file.   Social History Main Topics  . Smoking status: Never Smoker   . Smokeless tobacco: Never Used  . Alcohol Use: No  . Drug Use: No  . Sexual Activity: Yes   Other Topics Concern  . Not on file   Social History Narrative    Review of Systems: Constitutional: Negative for fever, chills, diaphoresis, activity change, appetite change and fatigue. HENT: Negative for ear pain, nosebleeds, congestion, facial swelling, rhinorrhea, neck pain, neck stiffness and ear discharge.  Eyes: Negative for pain, discharge, redness, itching and visual disturbance. Respiratory: Negative for cough, choking, chest tightness, shortness of breath, wheezing and stridor.  Cardiovascular: Negative for chest pain, palpitations and leg swelling. Gastrointestinal: Negative for abdominal distention. Genitourinary: Negative for dysuria, urgency, frequency, hematuria, flank pain, decreased urine volume, difficulty urinating and dyspareunia.  Musculoskeletal: Negative for back pain, joint swelling, arthralgia and gait problem. Neurological: Negative for dizziness, tremors, seizures, syncope, facial asymmetry, speech difficulty, weakness, light-headedness, numbness and headaches.  Hematological: Negative for adenopathy. Does not bruise/bleed easily. Skin: Negative for rash, ulcer. Psychiatric/Behavioral: Negative for hallucinations, behavioral problems, confusion, dysphoric mood, decreased concentration and agitation.    Objective:   Filed Vitals:   05/10/15 1057  BP: 130/87  Pulse: 87  Temp: 98.2 F (36.8 C)    Physical Exam: Constitutional: Patient appears well-developed and well-nourished. No distress. HENT: Normocephalic, atraumatic, External right and left ear normal. Oropharynx is clear and moist.  Eyes: Conjunctivae and EOM are normal. PERRLA, no scleral  icterus. Neck: Normal ROM, No JVD. No tracheal deviation. No thyromegaly. CVS: RRR, S1/S2 +, no murmurs, no gallops, no carotid bruit.  Pulmonary: Effort and breath sounds normal, no stridor, rhonchi, wheezes, rales.  Abdominal: Soft. BS +, no distension, tenderness, rebound or guarding.  Musculoskeletal: Normal range of motion. No edema and no tenderness.  Lymphadenopathy: No lymphadenopathy noted, cervical, inguinal or axillary Neuro: Alert. Normal reflexes, muscle tone coordination. No cranial nerve deficit. Skin: AV fistula in right upper medial arm with palpable thrill.  Psychiatric: Normal mood and affect. Behavior, judgment, thought content normal.  Lab Results  Component Value Date   WBC 7.8 05/05/2015   HGB 10.5* 05/05/2015   HCT 32.1* 05/05/2015   MCV 90.7 05/05/2015   PLT 247 05/05/2015   Lab Results  Component Value Date   CREATININE 8.63* 05/05/2015   BUN 34* 05/05/2015   NA 134* 05/05/2015   K 3.8 05/05/2015   CL 94* 05/05/2015   CO2 26 05/05/2015    Lab Results  Component Value Date   HGBA1C 8.8* 05/05/2015   Lipid Panel  Component Value Date/Time   CHOL  08/29/2009 0535    99        ATP III CLASSIFICATION:  <200     mg/dL   Desirable  200-239  mg/dL   Borderline High  >=240    mg/dL   High          TRIG 151* 08/29/2009 0535   HDL 22* 08/29/2009 0535   CHOLHDL 4.5 08/29/2009 0535   VLDL 30 08/29/2009 0535   LDLCALC  08/29/2009 0535    47        Total Cholesterol/HDL:CHD Risk Coronary Heart Disease Risk Table                     Men   Women  1/2 Average Risk   3.4   3.3  Average Risk       5.0   4.4  2 X Average Risk   9.6   7.1  3 X Average Risk  23.4   11.0        Use the calculated Patient Ratio above and the CHD Risk Table to determine the patient's CHD Risk.        ATP III CLASSIFICATION (LDL):  <100     mg/dL   Optimal  100-129  mg/dL   Near or Above                    Optimal  130-159  mg/dL   Borderline  160-189  mg/dL    High  >190     mg/dL   Very High       Assessment and plan:  49 year old male with a history of type 2 diabetes mellitus ( previously non compliant with medications), end-stage renal disease on hemodialysis, recently hospitalized for atypical chest pain and hyperglycemia.   Type 2 diabetes mellitus: A1c of 8.8, CBG of 133 He is noncompliant with his medications at this and so presented some improvement. Testing supplies sent to the pharmacy. He has expressed a desire to follow-up with his primary care physician Dr. Cecilio Asper who will be referring him to an endocrinologist if he so desires. Lipid panel sent off. . End-stage renal disease on hemodialysis: Continue hemodialysis Monday Wednesday and Friday as per protocol.   Hypertension: Controlled on diet.     This note has been created with Surveyor, quantity. Any transcriptional errors are unintentional.    Arnoldo Morale, MD. [facility] [phone] 05/10/2015, 11:17 AM

## 2015-05-10 NOTE — Patient Instructions (Signed)
Diabetes Mellitus and Food It is important for you to manage your blood sugar (glucose) level. Your blood glucose level can be greatly affected by what you eat. Eating healthier foods in the appropriate amounts throughout the day at about the same time each day will help you control your blood glucose level. It can also help slow or prevent worsening of your diabetes mellitus. Healthy eating may even help you improve the level of your blood pressure and reach or maintain a healthy weight.  HOW CAN FOOD AFFECT ME? Carbohydrates Carbohydrates affect your blood glucose level more than any other type of food. Your dietitian will help you determine how many carbohydrates to eat at each meal and teach you how to count carbohydrates. Counting carbohydrates is important to keep your blood glucose at a healthy level, especially if you are using insulin or taking certain medicines for diabetes mellitus. Alcohol Alcohol can cause sudden decreases in blood glucose (hypoglycemia), especially if you use insulin or take certain medicines for diabetes mellitus. Hypoglycemia can be a life-threatening condition. Symptoms of hypoglycemia (sleepiness, dizziness, and disorientation) are similar to symptoms of having too much alcohol.  If your health care provider has given you approval to drink alcohol, do so in moderation and use the following guidelines:  Women should not have more than one drink per day, and men should not have more than two drinks per day. One drink is equal to:  12 oz of beer.  5 oz of wine.  1 oz of hard liquor.  Do not drink on an empty stomach.  Keep yourself hydrated. Have water, diet soda, or unsweetened iced tea.  Regular soda, juice, and other mixers might contain a lot of carbohydrates and should be counted. WHAT FOODS ARE NOT RECOMMENDED? As you make food choices, it is important to remember that all foods are not the same. Some foods have fewer nutrients per serving than other  foods, even though they might have the same number of calories or carbohydrates. It is difficult to get your body what it needs when you eat foods with fewer nutrients. Examples of foods that you should avoid that are high in calories and carbohydrates but low in nutrients include:  Trans fats (most processed foods list trans fats on the Nutrition Facts label).  Regular soda.  Juice.  Candy.  Sweets, such as cake, pie, doughnuts, and cookies.  Fried foods. WHAT FOODS CAN I EAT? Have nutrient-rich foods, which will nourish your body and keep you healthy. The food you should eat also will depend on several factors, including:  The calories you need.  The medicines you take.  Your weight.  Your blood glucose level.  Your blood pressure level.  Your cholesterol level. You also should eat a variety of foods, including:  Protein, such as meat, poultry, fish, tofu, nuts, and seeds (lean animal proteins are best).  Fruits.  Vegetables.  Dairy products, such as milk, cheese, and yogurt (low fat is best).  Breads, grains, pasta, cereal, rice, and beans.  Fats such as olive oil, trans fat-free margarine, canola oil, avocado, and olives. DOES EVERYONE WITH DIABETES MELLITUS HAVE THE SAME MEAL PLAN? Because every person with diabetes mellitus is different, there is not one meal plan that works for everyone. It is very important that you meet with a dietitian who will help you create a meal plan that is just right for you. Document Released: 07/03/2005 Document Revised: 10/11/2013 Document Reviewed: 09/02/2013 Exit[employer] Patient Information 2015 [employer]. This   information is not intended to replace advice given to you by your health care provider. Make sure you discuss any questions you have with your health care provider.  

## 2015-05-10 NOTE — Progress Notes (Signed)
Patient here for follow up on his DM2 He is on dialysis MWF Bis CBG is 133 today He needs new meter and strips Patient asking for referral to endocrinologist

## 2015-05-11 ENCOUNTER — Other Ambulatory Visit: Payer: Self-pay | Admitting: Family Medicine

## 2015-05-11 ENCOUNTER — Telehealth: Payer: Self-pay | Admitting: *Deleted

## 2015-05-11 DIAGNOSIS — E1129 Type 2 diabetes mellitus with other diabetic kidney complication: Secondary | ICD-10-CM | POA: Diagnosis not present

## 2015-05-11 DIAGNOSIS — N2581 Secondary hyperparathyroidism of renal origin: Secondary | ICD-10-CM | POA: Diagnosis not present

## 2015-05-11 DIAGNOSIS — N186 End stage renal disease: Secondary | ICD-10-CM | POA: Diagnosis not present

## 2015-05-11 DIAGNOSIS — E785 Hyperlipidemia, unspecified: Secondary | ICD-10-CM | POA: Insufficient documentation

## 2015-05-11 DIAGNOSIS — D631 Anemia in chronic kidney disease: Secondary | ICD-10-CM | POA: Diagnosis not present

## 2015-05-11 MED ORDER — ATORVASTATIN CALCIUM 40 MG PO TABS: 40.0000 mg | ORAL_TABLET | Freq: Every day | ORAL | Status: DC

## 2015-05-11 NOTE — Telephone Encounter (Signed)
-----   Message from Arnoldo Morale, MD sent at 05/11/2015  8:40 AM EDT ----- Please inform him he has Hyperlipidemia for which i have sent a script to his pharmacy; advised to comply with low cholesterol diets.

## 2015-05-11 NOTE — Telephone Encounter (Signed)
Verified name and date of birth and gave information regarding hyperlipidemia and healthy food choices.  Patient verbalized understanding.

## 2015-05-14 DIAGNOSIS — N186 End stage renal disease: Secondary | ICD-10-CM | POA: Diagnosis not present

## 2015-05-14 DIAGNOSIS — D631 Anemia in chronic kidney disease: Secondary | ICD-10-CM | POA: Diagnosis not present

## 2015-05-14 DIAGNOSIS — E1129 Type 2 diabetes mellitus with other diabetic kidney complication: Secondary | ICD-10-CM | POA: Diagnosis not present

## 2015-05-14 DIAGNOSIS — N2581 Secondary hyperparathyroidism of renal origin: Secondary | ICD-10-CM | POA: Diagnosis not present

## 2015-05-16 DIAGNOSIS — N186 End stage renal disease: Secondary | ICD-10-CM | POA: Diagnosis not present

## 2015-05-16 DIAGNOSIS — N2581 Secondary hyperparathyroidism of renal origin: Secondary | ICD-10-CM | POA: Diagnosis not present

## 2015-05-16 DIAGNOSIS — E1129 Type 2 diabetes mellitus with other diabetic kidney complication: Secondary | ICD-10-CM | POA: Diagnosis not present

## 2015-05-16 DIAGNOSIS — D631 Anemia in chronic kidney disease: Secondary | ICD-10-CM | POA: Diagnosis not present

## 2015-05-18 DIAGNOSIS — E1129 Type 2 diabetes mellitus with other diabetic kidney complication: Secondary | ICD-10-CM | POA: Diagnosis not present

## 2015-05-18 DIAGNOSIS — N186 End stage renal disease: Secondary | ICD-10-CM | POA: Diagnosis not present

## 2015-05-18 DIAGNOSIS — D631 Anemia in chronic kidney disease: Secondary | ICD-10-CM | POA: Diagnosis not present

## 2015-05-18 DIAGNOSIS — N2581 Secondary hyperparathyroidism of renal origin: Secondary | ICD-10-CM | POA: Diagnosis not present

## 2015-05-20 DIAGNOSIS — Z992 Dependence on renal dialysis: Secondary | ICD-10-CM | POA: Diagnosis not present

## 2015-05-20 DIAGNOSIS — N186 End stage renal disease: Secondary | ICD-10-CM | POA: Diagnosis not present

## 2015-05-20 DIAGNOSIS — E1129 Type 2 diabetes mellitus with other diabetic kidney complication: Secondary | ICD-10-CM | POA: Diagnosis not present

## 2015-05-21 DIAGNOSIS — N186 End stage renal disease: Secondary | ICD-10-CM | POA: Diagnosis not present

## 2015-05-21 DIAGNOSIS — D631 Anemia in chronic kidney disease: Secondary | ICD-10-CM | POA: Diagnosis not present

## 2015-05-21 DIAGNOSIS — E1129 Type 2 diabetes mellitus with other diabetic kidney complication: Secondary | ICD-10-CM | POA: Diagnosis not present

## 2015-05-21 DIAGNOSIS — N2581 Secondary hyperparathyroidism of renal origin: Secondary | ICD-10-CM | POA: Diagnosis not present

## 2015-06-20 DIAGNOSIS — N186 End stage renal disease: Secondary | ICD-10-CM | POA: Diagnosis not present

## 2015-06-20 DIAGNOSIS — E1129 Type 2 diabetes mellitus with other diabetic kidney complication: Secondary | ICD-10-CM | POA: Diagnosis not present

## 2015-06-20 DIAGNOSIS — Z992 Dependence on renal dialysis: Secondary | ICD-10-CM | POA: Diagnosis not present

## 2015-06-22 DIAGNOSIS — E1129 Type 2 diabetes mellitus with other diabetic kidney complication: Secondary | ICD-10-CM | POA: Diagnosis not present

## 2015-06-22 DIAGNOSIS — N186 End stage renal disease: Secondary | ICD-10-CM | POA: Diagnosis not present

## 2015-06-22 DIAGNOSIS — N2581 Secondary hyperparathyroidism of renal origin: Secondary | ICD-10-CM | POA: Diagnosis not present

## 2015-06-22 DIAGNOSIS — D631 Anemia in chronic kidney disease: Secondary | ICD-10-CM | POA: Diagnosis not present

## 2015-06-25 DIAGNOSIS — N2581 Secondary hyperparathyroidism of renal origin: Secondary | ICD-10-CM | POA: Diagnosis not present

## 2015-06-25 DIAGNOSIS — N186 End stage renal disease: Secondary | ICD-10-CM | POA: Diagnosis not present

## 2015-06-27 DIAGNOSIS — N186 End stage renal disease: Secondary | ICD-10-CM | POA: Diagnosis not present

## 2015-06-27 DIAGNOSIS — N2581 Secondary hyperparathyroidism of renal origin: Secondary | ICD-10-CM | POA: Diagnosis not present

## 2015-06-29 DIAGNOSIS — N186 End stage renal disease: Secondary | ICD-10-CM | POA: Diagnosis not present

## 2015-06-29 DIAGNOSIS — N2581 Secondary hyperparathyroidism of renal origin: Secondary | ICD-10-CM | POA: Diagnosis not present

## 2015-07-20 DIAGNOSIS — Z992 Dependence on renal dialysis: Secondary | ICD-10-CM | POA: Diagnosis not present

## 2015-07-20 DIAGNOSIS — E1129 Type 2 diabetes mellitus with other diabetic kidney complication: Secondary | ICD-10-CM | POA: Diagnosis not present

## 2015-07-20 DIAGNOSIS — N186 End stage renal disease: Secondary | ICD-10-CM | POA: Diagnosis not present

## 2015-07-23 DIAGNOSIS — N186 End stage renal disease: Secondary | ICD-10-CM | POA: Diagnosis not present

## 2015-07-23 DIAGNOSIS — E1129 Type 2 diabetes mellitus with other diabetic kidney complication: Secondary | ICD-10-CM | POA: Diagnosis not present

## 2015-07-23 DIAGNOSIS — D509 Iron deficiency anemia, unspecified: Secondary | ICD-10-CM | POA: Diagnosis not present

## 2015-07-23 DIAGNOSIS — N2581 Secondary hyperparathyroidism of renal origin: Secondary | ICD-10-CM | POA: Diagnosis not present

## 2015-07-23 DIAGNOSIS — D631 Anemia in chronic kidney disease: Secondary | ICD-10-CM | POA: Diagnosis not present

## 2015-07-25 DIAGNOSIS — E1129 Type 2 diabetes mellitus with other diabetic kidney complication: Secondary | ICD-10-CM | POA: Diagnosis not present

## 2015-07-25 DIAGNOSIS — D509 Iron deficiency anemia, unspecified: Secondary | ICD-10-CM | POA: Diagnosis not present

## 2015-07-25 DIAGNOSIS — N186 End stage renal disease: Secondary | ICD-10-CM | POA: Diagnosis not present

## 2015-07-25 DIAGNOSIS — N2581 Secondary hyperparathyroidism of renal origin: Secondary | ICD-10-CM | POA: Diagnosis not present

## 2015-07-25 DIAGNOSIS — D631 Anemia in chronic kidney disease: Secondary | ICD-10-CM | POA: Diagnosis not present

## 2015-07-26 DIAGNOSIS — D631 Anemia in chronic kidney disease: Secondary | ICD-10-CM | POA: Diagnosis not present

## 2015-07-26 DIAGNOSIS — D509 Iron deficiency anemia, unspecified: Secondary | ICD-10-CM | POA: Diagnosis not present

## 2015-07-26 DIAGNOSIS — N186 End stage renal disease: Secondary | ICD-10-CM | POA: Diagnosis not present

## 2015-07-26 DIAGNOSIS — N2581 Secondary hyperparathyroidism of renal origin: Secondary | ICD-10-CM | POA: Diagnosis not present

## 2015-07-26 DIAGNOSIS — E1129 Type 2 diabetes mellitus with other diabetic kidney complication: Secondary | ICD-10-CM | POA: Diagnosis not present

## 2015-07-30 DIAGNOSIS — D631 Anemia in chronic kidney disease: Secondary | ICD-10-CM | POA: Diagnosis not present

## 2015-07-30 DIAGNOSIS — N2581 Secondary hyperparathyroidism of renal origin: Secondary | ICD-10-CM | POA: Diagnosis not present

## 2015-07-30 DIAGNOSIS — E1129 Type 2 diabetes mellitus with other diabetic kidney complication: Secondary | ICD-10-CM | POA: Diagnosis not present

## 2015-07-30 DIAGNOSIS — D509 Iron deficiency anemia, unspecified: Secondary | ICD-10-CM | POA: Diagnosis not present

## 2015-07-30 DIAGNOSIS — N186 End stage renal disease: Secondary | ICD-10-CM | POA: Diagnosis not present

## 2015-08-01 DIAGNOSIS — D631 Anemia in chronic kidney disease: Secondary | ICD-10-CM | POA: Diagnosis not present

## 2015-08-01 DIAGNOSIS — N186 End stage renal disease: Secondary | ICD-10-CM | POA: Diagnosis not present

## 2015-08-01 DIAGNOSIS — N2581 Secondary hyperparathyroidism of renal origin: Secondary | ICD-10-CM | POA: Diagnosis not present

## 2015-08-01 DIAGNOSIS — E1129 Type 2 diabetes mellitus with other diabetic kidney complication: Secondary | ICD-10-CM | POA: Diagnosis not present

## 2015-08-01 DIAGNOSIS — D509 Iron deficiency anemia, unspecified: Secondary | ICD-10-CM | POA: Diagnosis not present

## 2015-08-03 DIAGNOSIS — D509 Iron deficiency anemia, unspecified: Secondary | ICD-10-CM | POA: Diagnosis not present

## 2015-08-03 DIAGNOSIS — N186 End stage renal disease: Secondary | ICD-10-CM | POA: Diagnosis not present

## 2015-08-03 DIAGNOSIS — N2581 Secondary hyperparathyroidism of renal origin: Secondary | ICD-10-CM | POA: Diagnosis not present

## 2015-08-03 DIAGNOSIS — D631 Anemia in chronic kidney disease: Secondary | ICD-10-CM | POA: Diagnosis not present

## 2015-08-03 DIAGNOSIS — E1129 Type 2 diabetes mellitus with other diabetic kidney complication: Secondary | ICD-10-CM | POA: Diagnosis not present

## 2015-08-06 DIAGNOSIS — N2581 Secondary hyperparathyroidism of renal origin: Secondary | ICD-10-CM | POA: Diagnosis not present

## 2015-08-06 DIAGNOSIS — D631 Anemia in chronic kidney disease: Secondary | ICD-10-CM | POA: Diagnosis not present

## 2015-08-06 DIAGNOSIS — N186 End stage renal disease: Secondary | ICD-10-CM | POA: Diagnosis not present

## 2015-08-06 DIAGNOSIS — E1129 Type 2 diabetes mellitus with other diabetic kidney complication: Secondary | ICD-10-CM | POA: Diagnosis not present

## 2015-08-06 DIAGNOSIS — D509 Iron deficiency anemia, unspecified: Secondary | ICD-10-CM | POA: Diagnosis not present

## 2015-08-08 DIAGNOSIS — E1129 Type 2 diabetes mellitus with other diabetic kidney complication: Secondary | ICD-10-CM | POA: Diagnosis not present

## 2015-08-08 DIAGNOSIS — D509 Iron deficiency anemia, unspecified: Secondary | ICD-10-CM | POA: Diagnosis not present

## 2015-08-08 DIAGNOSIS — N186 End stage renal disease: Secondary | ICD-10-CM | POA: Diagnosis not present

## 2015-08-08 DIAGNOSIS — N2581 Secondary hyperparathyroidism of renal origin: Secondary | ICD-10-CM | POA: Diagnosis not present

## 2015-08-08 DIAGNOSIS — D631 Anemia in chronic kidney disease: Secondary | ICD-10-CM | POA: Diagnosis not present

## 2015-08-10 DIAGNOSIS — N186 End stage renal disease: Secondary | ICD-10-CM | POA: Diagnosis not present

## 2015-08-10 DIAGNOSIS — D631 Anemia in chronic kidney disease: Secondary | ICD-10-CM | POA: Diagnosis not present

## 2015-08-10 DIAGNOSIS — E1129 Type 2 diabetes mellitus with other diabetic kidney complication: Secondary | ICD-10-CM | POA: Diagnosis not present

## 2015-08-10 DIAGNOSIS — N2581 Secondary hyperparathyroidism of renal origin: Secondary | ICD-10-CM | POA: Diagnosis not present

## 2015-08-10 DIAGNOSIS — D509 Iron deficiency anemia, unspecified: Secondary | ICD-10-CM | POA: Diagnosis not present

## 2015-08-13 DIAGNOSIS — E1129 Type 2 diabetes mellitus with other diabetic kidney complication: Secondary | ICD-10-CM | POA: Diagnosis not present

## 2015-08-13 DIAGNOSIS — N2581 Secondary hyperparathyroidism of renal origin: Secondary | ICD-10-CM | POA: Diagnosis not present

## 2015-08-13 DIAGNOSIS — D631 Anemia in chronic kidney disease: Secondary | ICD-10-CM | POA: Diagnosis not present

## 2015-08-13 DIAGNOSIS — D509 Iron deficiency anemia, unspecified: Secondary | ICD-10-CM | POA: Diagnosis not present

## 2015-08-13 DIAGNOSIS — N186 End stage renal disease: Secondary | ICD-10-CM | POA: Diagnosis not present

## 2015-08-15 DIAGNOSIS — D631 Anemia in chronic kidney disease: Secondary | ICD-10-CM | POA: Diagnosis not present

## 2015-08-15 DIAGNOSIS — N2581 Secondary hyperparathyroidism of renal origin: Secondary | ICD-10-CM | POA: Diagnosis not present

## 2015-08-15 DIAGNOSIS — E1129 Type 2 diabetes mellitus with other diabetic kidney complication: Secondary | ICD-10-CM | POA: Diagnosis not present

## 2015-08-15 DIAGNOSIS — D509 Iron deficiency anemia, unspecified: Secondary | ICD-10-CM | POA: Diagnosis not present

## 2015-08-15 DIAGNOSIS — N186 End stage renal disease: Secondary | ICD-10-CM | POA: Diagnosis not present

## 2015-08-17 DIAGNOSIS — D631 Anemia in chronic kidney disease: Secondary | ICD-10-CM | POA: Diagnosis not present

## 2015-08-17 DIAGNOSIS — N186 End stage renal disease: Secondary | ICD-10-CM | POA: Diagnosis not present

## 2015-08-17 DIAGNOSIS — D509 Iron deficiency anemia, unspecified: Secondary | ICD-10-CM | POA: Diagnosis not present

## 2015-08-17 DIAGNOSIS — E1129 Type 2 diabetes mellitus with other diabetic kidney complication: Secondary | ICD-10-CM | POA: Diagnosis not present

## 2015-08-17 DIAGNOSIS — N2581 Secondary hyperparathyroidism of renal origin: Secondary | ICD-10-CM | POA: Diagnosis not present

## 2015-08-20 DIAGNOSIS — E1129 Type 2 diabetes mellitus with other diabetic kidney complication: Secondary | ICD-10-CM | POA: Diagnosis not present

## 2015-08-20 DIAGNOSIS — D509 Iron deficiency anemia, unspecified: Secondary | ICD-10-CM | POA: Diagnosis not present

## 2015-08-20 DIAGNOSIS — N186 End stage renal disease: Secondary | ICD-10-CM | POA: Diagnosis not present

## 2015-08-20 DIAGNOSIS — D631 Anemia in chronic kidney disease: Secondary | ICD-10-CM | POA: Diagnosis not present

## 2015-08-20 DIAGNOSIS — Z992 Dependence on renal dialysis: Secondary | ICD-10-CM | POA: Diagnosis not present

## 2015-08-20 DIAGNOSIS — N2581 Secondary hyperparathyroidism of renal origin: Secondary | ICD-10-CM | POA: Diagnosis not present

## 2015-08-22 DIAGNOSIS — N2581 Secondary hyperparathyroidism of renal origin: Secondary | ICD-10-CM | POA: Diagnosis not present

## 2015-08-22 DIAGNOSIS — D509 Iron deficiency anemia, unspecified: Secondary | ICD-10-CM | POA: Diagnosis not present

## 2015-08-22 DIAGNOSIS — E1129 Type 2 diabetes mellitus with other diabetic kidney complication: Secondary | ICD-10-CM | POA: Diagnosis not present

## 2015-08-22 DIAGNOSIS — D631 Anemia in chronic kidney disease: Secondary | ICD-10-CM | POA: Diagnosis not present

## 2015-08-22 DIAGNOSIS — N186 End stage renal disease: Secondary | ICD-10-CM | POA: Diagnosis not present

## 2015-08-23 DIAGNOSIS — D509 Iron deficiency anemia, unspecified: Secondary | ICD-10-CM | POA: Diagnosis not present

## 2015-08-23 DIAGNOSIS — N2581 Secondary hyperparathyroidism of renal origin: Secondary | ICD-10-CM | POA: Diagnosis not present

## 2015-08-23 DIAGNOSIS — N186 End stage renal disease: Secondary | ICD-10-CM | POA: Diagnosis not present

## 2015-08-23 DIAGNOSIS — E1129 Type 2 diabetes mellitus with other diabetic kidney complication: Secondary | ICD-10-CM | POA: Diagnosis not present

## 2015-08-23 DIAGNOSIS — D631 Anemia in chronic kidney disease: Secondary | ICD-10-CM | POA: Diagnosis not present

## 2015-08-27 DIAGNOSIS — D631 Anemia in chronic kidney disease: Secondary | ICD-10-CM | POA: Diagnosis not present

## 2015-08-27 DIAGNOSIS — N2581 Secondary hyperparathyroidism of renal origin: Secondary | ICD-10-CM | POA: Diagnosis not present

## 2015-08-27 DIAGNOSIS — N186 End stage renal disease: Secondary | ICD-10-CM | POA: Diagnosis not present

## 2015-08-27 DIAGNOSIS — E1129 Type 2 diabetes mellitus with other diabetic kidney complication: Secondary | ICD-10-CM | POA: Diagnosis not present

## 2015-08-27 DIAGNOSIS — D509 Iron deficiency anemia, unspecified: Secondary | ICD-10-CM | POA: Diagnosis not present

## 2015-08-29 DIAGNOSIS — N2581 Secondary hyperparathyroidism of renal origin: Secondary | ICD-10-CM | POA: Diagnosis not present

## 2015-08-29 DIAGNOSIS — N186 End stage renal disease: Secondary | ICD-10-CM | POA: Diagnosis not present

## 2015-08-29 DIAGNOSIS — D631 Anemia in chronic kidney disease: Secondary | ICD-10-CM | POA: Diagnosis not present

## 2015-08-29 DIAGNOSIS — E1129 Type 2 diabetes mellitus with other diabetic kidney complication: Secondary | ICD-10-CM | POA: Diagnosis not present

## 2015-08-29 DIAGNOSIS — D509 Iron deficiency anemia, unspecified: Secondary | ICD-10-CM | POA: Diagnosis not present

## 2015-08-31 DIAGNOSIS — D631 Anemia in chronic kidney disease: Secondary | ICD-10-CM | POA: Diagnosis not present

## 2015-08-31 DIAGNOSIS — N186 End stage renal disease: Secondary | ICD-10-CM | POA: Diagnosis not present

## 2015-08-31 DIAGNOSIS — N2581 Secondary hyperparathyroidism of renal origin: Secondary | ICD-10-CM | POA: Diagnosis not present

## 2015-08-31 DIAGNOSIS — D509 Iron deficiency anemia, unspecified: Secondary | ICD-10-CM | POA: Diagnosis not present

## 2015-08-31 DIAGNOSIS — E1129 Type 2 diabetes mellitus with other diabetic kidney complication: Secondary | ICD-10-CM | POA: Diagnosis not present

## 2015-09-03 DIAGNOSIS — D631 Anemia in chronic kidney disease: Secondary | ICD-10-CM | POA: Diagnosis not present

## 2015-09-03 DIAGNOSIS — N186 End stage renal disease: Secondary | ICD-10-CM | POA: Diagnosis not present

## 2015-09-03 DIAGNOSIS — D509 Iron deficiency anemia, unspecified: Secondary | ICD-10-CM | POA: Diagnosis not present

## 2015-09-03 DIAGNOSIS — E1129 Type 2 diabetes mellitus with other diabetic kidney complication: Secondary | ICD-10-CM | POA: Diagnosis not present

## 2015-09-03 DIAGNOSIS — N2581 Secondary hyperparathyroidism of renal origin: Secondary | ICD-10-CM | POA: Diagnosis not present

## 2015-09-05 DIAGNOSIS — D631 Anemia in chronic kidney disease: Secondary | ICD-10-CM | POA: Diagnosis not present

## 2015-09-05 DIAGNOSIS — E1129 Type 2 diabetes mellitus with other diabetic kidney complication: Secondary | ICD-10-CM | POA: Diagnosis not present

## 2015-09-05 DIAGNOSIS — N186 End stage renal disease: Secondary | ICD-10-CM | POA: Diagnosis not present

## 2015-09-05 DIAGNOSIS — N2581 Secondary hyperparathyroidism of renal origin: Secondary | ICD-10-CM | POA: Diagnosis not present

## 2015-09-05 DIAGNOSIS — D509 Iron deficiency anemia, unspecified: Secondary | ICD-10-CM | POA: Diagnosis not present

## 2015-09-07 DIAGNOSIS — D509 Iron deficiency anemia, unspecified: Secondary | ICD-10-CM | POA: Diagnosis not present

## 2015-09-07 DIAGNOSIS — N2581 Secondary hyperparathyroidism of renal origin: Secondary | ICD-10-CM | POA: Diagnosis not present

## 2015-09-07 DIAGNOSIS — N186 End stage renal disease: Secondary | ICD-10-CM | POA: Diagnosis not present

## 2015-09-07 DIAGNOSIS — E1129 Type 2 diabetes mellitus with other diabetic kidney complication: Secondary | ICD-10-CM | POA: Diagnosis not present

## 2015-09-07 DIAGNOSIS — D631 Anemia in chronic kidney disease: Secondary | ICD-10-CM | POA: Diagnosis not present

## 2015-09-10 DIAGNOSIS — D509 Iron deficiency anemia, unspecified: Secondary | ICD-10-CM | POA: Diagnosis not present

## 2015-09-10 DIAGNOSIS — N2581 Secondary hyperparathyroidism of renal origin: Secondary | ICD-10-CM | POA: Diagnosis not present

## 2015-09-10 DIAGNOSIS — N186 End stage renal disease: Secondary | ICD-10-CM | POA: Diagnosis not present

## 2015-09-10 DIAGNOSIS — D631 Anemia in chronic kidney disease: Secondary | ICD-10-CM | POA: Diagnosis not present

## 2015-09-10 DIAGNOSIS — E1129 Type 2 diabetes mellitus with other diabetic kidney complication: Secondary | ICD-10-CM | POA: Diagnosis not present

## 2015-09-12 DIAGNOSIS — N186 End stage renal disease: Secondary | ICD-10-CM | POA: Diagnosis not present

## 2015-09-12 DIAGNOSIS — D509 Iron deficiency anemia, unspecified: Secondary | ICD-10-CM | POA: Diagnosis not present

## 2015-09-12 DIAGNOSIS — E1129 Type 2 diabetes mellitus with other diabetic kidney complication: Secondary | ICD-10-CM | POA: Diagnosis not present

## 2015-09-12 DIAGNOSIS — D631 Anemia in chronic kidney disease: Secondary | ICD-10-CM | POA: Diagnosis not present

## 2015-09-12 DIAGNOSIS — N2581 Secondary hyperparathyroidism of renal origin: Secondary | ICD-10-CM | POA: Diagnosis not present

## 2015-09-14 DIAGNOSIS — N2581 Secondary hyperparathyroidism of renal origin: Secondary | ICD-10-CM | POA: Diagnosis not present

## 2015-09-14 DIAGNOSIS — D631 Anemia in chronic kidney disease: Secondary | ICD-10-CM | POA: Diagnosis not present

## 2015-09-14 DIAGNOSIS — E1129 Type 2 diabetes mellitus with other diabetic kidney complication: Secondary | ICD-10-CM | POA: Diagnosis not present

## 2015-09-14 DIAGNOSIS — N186 End stage renal disease: Secondary | ICD-10-CM | POA: Diagnosis not present

## 2015-09-14 DIAGNOSIS — D509 Iron deficiency anemia, unspecified: Secondary | ICD-10-CM | POA: Diagnosis not present

## 2015-09-17 DIAGNOSIS — E1129 Type 2 diabetes mellitus with other diabetic kidney complication: Secondary | ICD-10-CM | POA: Diagnosis not present

## 2015-09-17 DIAGNOSIS — D631 Anemia in chronic kidney disease: Secondary | ICD-10-CM | POA: Diagnosis not present

## 2015-09-17 DIAGNOSIS — N2581 Secondary hyperparathyroidism of renal origin: Secondary | ICD-10-CM | POA: Diagnosis not present

## 2015-09-17 DIAGNOSIS — N186 End stage renal disease: Secondary | ICD-10-CM | POA: Diagnosis not present

## 2015-09-17 DIAGNOSIS — D509 Iron deficiency anemia, unspecified: Secondary | ICD-10-CM | POA: Diagnosis not present

## 2015-09-19 DIAGNOSIS — E1129 Type 2 diabetes mellitus with other diabetic kidney complication: Secondary | ICD-10-CM | POA: Diagnosis not present

## 2015-09-19 DIAGNOSIS — Z992 Dependence on renal dialysis: Secondary | ICD-10-CM | POA: Diagnosis not present

## 2015-09-19 DIAGNOSIS — N2581 Secondary hyperparathyroidism of renal origin: Secondary | ICD-10-CM | POA: Diagnosis not present

## 2015-09-19 DIAGNOSIS — D631 Anemia in chronic kidney disease: Secondary | ICD-10-CM | POA: Diagnosis not present

## 2015-09-19 DIAGNOSIS — N186 End stage renal disease: Secondary | ICD-10-CM | POA: Diagnosis not present

## 2015-09-19 DIAGNOSIS — D509 Iron deficiency anemia, unspecified: Secondary | ICD-10-CM | POA: Diagnosis not present

## 2015-09-21 DIAGNOSIS — E1129 Type 2 diabetes mellitus with other diabetic kidney complication: Secondary | ICD-10-CM | POA: Diagnosis not present

## 2015-09-21 DIAGNOSIS — N186 End stage renal disease: Secondary | ICD-10-CM | POA: Diagnosis not present

## 2015-09-21 DIAGNOSIS — D631 Anemia in chronic kidney disease: Secondary | ICD-10-CM | POA: Diagnosis not present

## 2015-09-21 DIAGNOSIS — N2581 Secondary hyperparathyroidism of renal origin: Secondary | ICD-10-CM | POA: Diagnosis not present

## 2015-10-20 DIAGNOSIS — E1129 Type 2 diabetes mellitus with other diabetic kidney complication: Secondary | ICD-10-CM | POA: Diagnosis not present

## 2015-10-20 DIAGNOSIS — Z992 Dependence on renal dialysis: Secondary | ICD-10-CM | POA: Diagnosis not present

## 2015-10-20 DIAGNOSIS — N186 End stage renal disease: Secondary | ICD-10-CM | POA: Diagnosis not present

## 2015-10-22 DIAGNOSIS — E1129 Type 2 diabetes mellitus with other diabetic kidney complication: Secondary | ICD-10-CM | POA: Diagnosis not present

## 2015-10-22 DIAGNOSIS — N186 End stage renal disease: Secondary | ICD-10-CM | POA: Diagnosis not present

## 2015-10-22 DIAGNOSIS — D631 Anemia in chronic kidney disease: Secondary | ICD-10-CM | POA: Diagnosis not present

## 2015-10-22 DIAGNOSIS — N2581 Secondary hyperparathyroidism of renal origin: Secondary | ICD-10-CM | POA: Diagnosis not present

## 2015-11-14 DIAGNOSIS — E1129 Type 2 diabetes mellitus with other diabetic kidney complication: Secondary | ICD-10-CM | POA: Diagnosis not present

## 2015-11-20 DIAGNOSIS — E1129 Type 2 diabetes mellitus with other diabetic kidney complication: Secondary | ICD-10-CM | POA: Diagnosis not present

## 2015-11-20 DIAGNOSIS — N186 End stage renal disease: Secondary | ICD-10-CM | POA: Diagnosis not present

## 2015-11-20 DIAGNOSIS — Z992 Dependence on renal dialysis: Secondary | ICD-10-CM | POA: Diagnosis not present

## 2015-11-21 DIAGNOSIS — E1129 Type 2 diabetes mellitus with other diabetic kidney complication: Secondary | ICD-10-CM | POA: Diagnosis not present

## 2015-11-21 DIAGNOSIS — N186 End stage renal disease: Secondary | ICD-10-CM | POA: Diagnosis not present

## 2015-11-21 DIAGNOSIS — N2581 Secondary hyperparathyroidism of renal origin: Secondary | ICD-10-CM | POA: Diagnosis not present

## 2015-11-21 DIAGNOSIS — D631 Anemia in chronic kidney disease: Secondary | ICD-10-CM | POA: Diagnosis not present

## 2015-12-18 DIAGNOSIS — Z992 Dependence on renal dialysis: Secondary | ICD-10-CM | POA: Diagnosis not present

## 2015-12-18 DIAGNOSIS — N186 End stage renal disease: Secondary | ICD-10-CM | POA: Diagnosis not present

## 2015-12-18 DIAGNOSIS — E1129 Type 2 diabetes mellitus with other diabetic kidney complication: Secondary | ICD-10-CM | POA: Diagnosis not present

## 2015-12-19 DIAGNOSIS — N2581 Secondary hyperparathyroidism of renal origin: Secondary | ICD-10-CM | POA: Diagnosis not present

## 2015-12-19 DIAGNOSIS — E1129 Type 2 diabetes mellitus with other diabetic kidney complication: Secondary | ICD-10-CM | POA: Diagnosis not present

## 2015-12-19 DIAGNOSIS — N186 End stage renal disease: Secondary | ICD-10-CM | POA: Diagnosis not present

## 2015-12-19 DIAGNOSIS — D631 Anemia in chronic kidney disease: Secondary | ICD-10-CM | POA: Diagnosis not present

## 2016-01-18 DIAGNOSIS — N186 End stage renal disease: Secondary | ICD-10-CM | POA: Diagnosis not present

## 2016-01-18 DIAGNOSIS — Z992 Dependence on renal dialysis: Secondary | ICD-10-CM | POA: Diagnosis not present

## 2016-01-18 DIAGNOSIS — E1129 Type 2 diabetes mellitus with other diabetic kidney complication: Secondary | ICD-10-CM | POA: Diagnosis not present

## 2016-01-21 DIAGNOSIS — E1129 Type 2 diabetes mellitus with other diabetic kidney complication: Secondary | ICD-10-CM | POA: Diagnosis not present

## 2016-01-21 DIAGNOSIS — D631 Anemia in chronic kidney disease: Secondary | ICD-10-CM | POA: Diagnosis not present

## 2016-01-21 DIAGNOSIS — N186 End stage renal disease: Secondary | ICD-10-CM | POA: Diagnosis not present

## 2016-01-21 DIAGNOSIS — N2581 Secondary hyperparathyroidism of renal origin: Secondary | ICD-10-CM | POA: Diagnosis not present

## 2016-02-13 DIAGNOSIS — E1129 Type 2 diabetes mellitus with other diabetic kidney complication: Secondary | ICD-10-CM | POA: Diagnosis not present

## 2016-02-17 DIAGNOSIS — E1129 Type 2 diabetes mellitus with other diabetic kidney complication: Secondary | ICD-10-CM | POA: Diagnosis not present

## 2016-02-17 DIAGNOSIS — N186 End stage renal disease: Secondary | ICD-10-CM | POA: Diagnosis not present

## 2016-02-17 DIAGNOSIS — Z992 Dependence on renal dialysis: Secondary | ICD-10-CM | POA: Diagnosis not present

## 2016-02-18 DIAGNOSIS — E1129 Type 2 diabetes mellitus with other diabetic kidney complication: Secondary | ICD-10-CM | POA: Diagnosis not present

## 2016-02-18 DIAGNOSIS — N2581 Secondary hyperparathyroidism of renal origin: Secondary | ICD-10-CM | POA: Diagnosis not present

## 2016-02-18 DIAGNOSIS — N186 End stage renal disease: Secondary | ICD-10-CM | POA: Diagnosis not present

## 2016-02-18 DIAGNOSIS — D631 Anemia in chronic kidney disease: Secondary | ICD-10-CM | POA: Diagnosis not present

## 2016-02-20 DIAGNOSIS — N186 End stage renal disease: Secondary | ICD-10-CM | POA: Diagnosis not present

## 2016-02-20 DIAGNOSIS — E1129 Type 2 diabetes mellitus with other diabetic kidney complication: Secondary | ICD-10-CM | POA: Diagnosis not present

## 2016-02-20 DIAGNOSIS — D631 Anemia in chronic kidney disease: Secondary | ICD-10-CM | POA: Diagnosis not present

## 2016-02-20 DIAGNOSIS — N2581 Secondary hyperparathyroidism of renal origin: Secondary | ICD-10-CM | POA: Diagnosis not present

## 2016-02-22 DIAGNOSIS — N2581 Secondary hyperparathyroidism of renal origin: Secondary | ICD-10-CM | POA: Diagnosis not present

## 2016-02-22 DIAGNOSIS — D631 Anemia in chronic kidney disease: Secondary | ICD-10-CM | POA: Diagnosis not present

## 2016-02-22 DIAGNOSIS — E1129 Type 2 diabetes mellitus with other diabetic kidney complication: Secondary | ICD-10-CM | POA: Diagnosis not present

## 2016-02-22 DIAGNOSIS — N186 End stage renal disease: Secondary | ICD-10-CM | POA: Diagnosis not present

## 2016-02-25 DIAGNOSIS — N2581 Secondary hyperparathyroidism of renal origin: Secondary | ICD-10-CM | POA: Diagnosis not present

## 2016-02-25 DIAGNOSIS — D631 Anemia in chronic kidney disease: Secondary | ICD-10-CM | POA: Diagnosis not present

## 2016-02-25 DIAGNOSIS — N186 End stage renal disease: Secondary | ICD-10-CM | POA: Diagnosis not present

## 2016-02-25 DIAGNOSIS — E1129 Type 2 diabetes mellitus with other diabetic kidney complication: Secondary | ICD-10-CM | POA: Diagnosis not present

## 2016-02-27 DIAGNOSIS — D631 Anemia in chronic kidney disease: Secondary | ICD-10-CM | POA: Diagnosis not present

## 2016-02-27 DIAGNOSIS — N2581 Secondary hyperparathyroidism of renal origin: Secondary | ICD-10-CM | POA: Diagnosis not present

## 2016-02-27 DIAGNOSIS — E1129 Type 2 diabetes mellitus with other diabetic kidney complication: Secondary | ICD-10-CM | POA: Diagnosis not present

## 2016-02-27 DIAGNOSIS — N186 End stage renal disease: Secondary | ICD-10-CM | POA: Diagnosis not present

## 2016-02-29 DIAGNOSIS — N2581 Secondary hyperparathyroidism of renal origin: Secondary | ICD-10-CM | POA: Diagnosis not present

## 2016-02-29 DIAGNOSIS — D631 Anemia in chronic kidney disease: Secondary | ICD-10-CM | POA: Diagnosis not present

## 2016-02-29 DIAGNOSIS — E1129 Type 2 diabetes mellitus with other diabetic kidney complication: Secondary | ICD-10-CM | POA: Diagnosis not present

## 2016-02-29 DIAGNOSIS — N186 End stage renal disease: Secondary | ICD-10-CM | POA: Diagnosis not present

## 2016-03-03 DIAGNOSIS — N186 End stage renal disease: Secondary | ICD-10-CM | POA: Diagnosis not present

## 2016-03-03 DIAGNOSIS — E1129 Type 2 diabetes mellitus with other diabetic kidney complication: Secondary | ICD-10-CM | POA: Diagnosis not present

## 2016-03-03 DIAGNOSIS — N2581 Secondary hyperparathyroidism of renal origin: Secondary | ICD-10-CM | POA: Diagnosis not present

## 2016-03-03 DIAGNOSIS — D631 Anemia in chronic kidney disease: Secondary | ICD-10-CM | POA: Diagnosis not present

## 2016-03-05 DIAGNOSIS — E1129 Type 2 diabetes mellitus with other diabetic kidney complication: Secondary | ICD-10-CM | POA: Diagnosis not present

## 2016-03-05 DIAGNOSIS — D631 Anemia in chronic kidney disease: Secondary | ICD-10-CM | POA: Diagnosis not present

## 2016-03-05 DIAGNOSIS — N186 End stage renal disease: Secondary | ICD-10-CM | POA: Diagnosis not present

## 2016-03-05 DIAGNOSIS — N2581 Secondary hyperparathyroidism of renal origin: Secondary | ICD-10-CM | POA: Diagnosis not present

## 2016-03-07 DIAGNOSIS — N186 End stage renal disease: Secondary | ICD-10-CM | POA: Diagnosis not present

## 2016-03-07 DIAGNOSIS — E1129 Type 2 diabetes mellitus with other diabetic kidney complication: Secondary | ICD-10-CM | POA: Diagnosis not present

## 2016-03-07 DIAGNOSIS — D631 Anemia in chronic kidney disease: Secondary | ICD-10-CM | POA: Diagnosis not present

## 2016-03-07 DIAGNOSIS — N2581 Secondary hyperparathyroidism of renal origin: Secondary | ICD-10-CM | POA: Diagnosis not present

## 2016-03-10 DIAGNOSIS — D631 Anemia in chronic kidney disease: Secondary | ICD-10-CM | POA: Diagnosis not present

## 2016-03-10 DIAGNOSIS — N186 End stage renal disease: Secondary | ICD-10-CM | POA: Diagnosis not present

## 2016-03-10 DIAGNOSIS — E1129 Type 2 diabetes mellitus with other diabetic kidney complication: Secondary | ICD-10-CM | POA: Diagnosis not present

## 2016-03-10 DIAGNOSIS — N2581 Secondary hyperparathyroidism of renal origin: Secondary | ICD-10-CM | POA: Diagnosis not present

## 2016-03-12 DIAGNOSIS — N2581 Secondary hyperparathyroidism of renal origin: Secondary | ICD-10-CM | POA: Diagnosis not present

## 2016-03-12 DIAGNOSIS — E1129 Type 2 diabetes mellitus with other diabetic kidney complication: Secondary | ICD-10-CM | POA: Diagnosis not present

## 2016-03-12 DIAGNOSIS — N186 End stage renal disease: Secondary | ICD-10-CM | POA: Diagnosis not present

## 2016-03-12 DIAGNOSIS — D631 Anemia in chronic kidney disease: Secondary | ICD-10-CM | POA: Diagnosis not present

## 2016-03-14 DIAGNOSIS — D631 Anemia in chronic kidney disease: Secondary | ICD-10-CM | POA: Diagnosis not present

## 2016-03-14 DIAGNOSIS — N186 End stage renal disease: Secondary | ICD-10-CM | POA: Diagnosis not present

## 2016-03-14 DIAGNOSIS — N2581 Secondary hyperparathyroidism of renal origin: Secondary | ICD-10-CM | POA: Diagnosis not present

## 2016-03-14 DIAGNOSIS — E1129 Type 2 diabetes mellitus with other diabetic kidney complication: Secondary | ICD-10-CM | POA: Diagnosis not present

## 2016-03-17 DIAGNOSIS — N186 End stage renal disease: Secondary | ICD-10-CM | POA: Diagnosis not present

## 2016-03-17 DIAGNOSIS — D631 Anemia in chronic kidney disease: Secondary | ICD-10-CM | POA: Diagnosis not present

## 2016-03-17 DIAGNOSIS — N2581 Secondary hyperparathyroidism of renal origin: Secondary | ICD-10-CM | POA: Diagnosis not present

## 2016-03-17 DIAGNOSIS — E1129 Type 2 diabetes mellitus with other diabetic kidney complication: Secondary | ICD-10-CM | POA: Diagnosis not present

## 2016-03-19 DIAGNOSIS — D631 Anemia in chronic kidney disease: Secondary | ICD-10-CM | POA: Diagnosis not present

## 2016-03-19 DIAGNOSIS — Z992 Dependence on renal dialysis: Secondary | ICD-10-CM | POA: Diagnosis not present

## 2016-03-19 DIAGNOSIS — N2581 Secondary hyperparathyroidism of renal origin: Secondary | ICD-10-CM | POA: Diagnosis not present

## 2016-03-19 DIAGNOSIS — N186 End stage renal disease: Secondary | ICD-10-CM | POA: Diagnosis not present

## 2016-03-19 DIAGNOSIS — E1129 Type 2 diabetes mellitus with other diabetic kidney complication: Secondary | ICD-10-CM | POA: Diagnosis not present

## 2016-03-21 DIAGNOSIS — N2581 Secondary hyperparathyroidism of renal origin: Secondary | ICD-10-CM | POA: Diagnosis not present

## 2016-03-21 DIAGNOSIS — D631 Anemia in chronic kidney disease: Secondary | ICD-10-CM | POA: Diagnosis not present

## 2016-03-21 DIAGNOSIS — E1129 Type 2 diabetes mellitus with other diabetic kidney complication: Secondary | ICD-10-CM | POA: Diagnosis not present

## 2016-03-21 DIAGNOSIS — N186 End stage renal disease: Secondary | ICD-10-CM | POA: Diagnosis not present

## 2016-04-18 DIAGNOSIS — N186 End stage renal disease: Secondary | ICD-10-CM | POA: Diagnosis not present

## 2016-04-18 DIAGNOSIS — Z992 Dependence on renal dialysis: Secondary | ICD-10-CM | POA: Diagnosis not present

## 2016-04-18 DIAGNOSIS — E1129 Type 2 diabetes mellitus with other diabetic kidney complication: Secondary | ICD-10-CM | POA: Diagnosis not present

## 2016-04-21 DIAGNOSIS — N2581 Secondary hyperparathyroidism of renal origin: Secondary | ICD-10-CM | POA: Diagnosis not present

## 2016-04-21 DIAGNOSIS — N186 End stage renal disease: Secondary | ICD-10-CM | POA: Diagnosis not present

## 2016-04-23 DIAGNOSIS — N2581 Secondary hyperparathyroidism of renal origin: Secondary | ICD-10-CM | POA: Diagnosis not present

## 2016-04-23 DIAGNOSIS — N186 End stage renal disease: Secondary | ICD-10-CM | POA: Diagnosis not present

## 2016-04-25 DIAGNOSIS — N186 End stage renal disease: Secondary | ICD-10-CM | POA: Diagnosis not present

## 2016-04-25 DIAGNOSIS — N2581 Secondary hyperparathyroidism of renal origin: Secondary | ICD-10-CM | POA: Diagnosis not present

## 2016-04-28 DIAGNOSIS — N2581 Secondary hyperparathyroidism of renal origin: Secondary | ICD-10-CM | POA: Diagnosis not present

## 2016-04-28 DIAGNOSIS — E1129 Type 2 diabetes mellitus with other diabetic kidney complication: Secondary | ICD-10-CM | POA: Diagnosis not present

## 2016-04-28 DIAGNOSIS — D631 Anemia in chronic kidney disease: Secondary | ICD-10-CM | POA: Diagnosis not present

## 2016-04-28 DIAGNOSIS — N186 End stage renal disease: Secondary | ICD-10-CM | POA: Diagnosis not present

## 2016-04-30 DIAGNOSIS — N186 End stage renal disease: Secondary | ICD-10-CM | POA: Diagnosis not present

## 2016-04-30 DIAGNOSIS — N2581 Secondary hyperparathyroidism of renal origin: Secondary | ICD-10-CM | POA: Diagnosis not present

## 2016-04-30 DIAGNOSIS — E1129 Type 2 diabetes mellitus with other diabetic kidney complication: Secondary | ICD-10-CM | POA: Diagnosis not present

## 2016-04-30 DIAGNOSIS — D631 Anemia in chronic kidney disease: Secondary | ICD-10-CM | POA: Diagnosis not present

## 2016-05-02 DIAGNOSIS — N2581 Secondary hyperparathyroidism of renal origin: Secondary | ICD-10-CM | POA: Diagnosis not present

## 2016-05-02 DIAGNOSIS — E1129 Type 2 diabetes mellitus with other diabetic kidney complication: Secondary | ICD-10-CM | POA: Diagnosis not present

## 2016-05-02 DIAGNOSIS — N186 End stage renal disease: Secondary | ICD-10-CM | POA: Diagnosis not present

## 2016-05-02 DIAGNOSIS — D631 Anemia in chronic kidney disease: Secondary | ICD-10-CM | POA: Diagnosis not present

## 2016-05-05 DIAGNOSIS — N2581 Secondary hyperparathyroidism of renal origin: Secondary | ICD-10-CM | POA: Diagnosis not present

## 2016-05-05 DIAGNOSIS — E1129 Type 2 diabetes mellitus with other diabetic kidney complication: Secondary | ICD-10-CM | POA: Diagnosis not present

## 2016-05-05 DIAGNOSIS — N186 End stage renal disease: Secondary | ICD-10-CM | POA: Diagnosis not present

## 2016-05-05 DIAGNOSIS — D631 Anemia in chronic kidney disease: Secondary | ICD-10-CM | POA: Diagnosis not present

## 2016-05-07 DIAGNOSIS — N2581 Secondary hyperparathyroidism of renal origin: Secondary | ICD-10-CM | POA: Diagnosis not present

## 2016-05-07 DIAGNOSIS — N186 End stage renal disease: Secondary | ICD-10-CM | POA: Diagnosis not present

## 2016-05-07 DIAGNOSIS — D631 Anemia in chronic kidney disease: Secondary | ICD-10-CM | POA: Diagnosis not present

## 2016-05-07 DIAGNOSIS — E1129 Type 2 diabetes mellitus with other diabetic kidney complication: Secondary | ICD-10-CM | POA: Diagnosis not present

## 2016-05-09 DIAGNOSIS — E1129 Type 2 diabetes mellitus with other diabetic kidney complication: Secondary | ICD-10-CM | POA: Diagnosis not present

## 2016-05-09 DIAGNOSIS — N2581 Secondary hyperparathyroidism of renal origin: Secondary | ICD-10-CM | POA: Diagnosis not present

## 2016-05-09 DIAGNOSIS — N186 End stage renal disease: Secondary | ICD-10-CM | POA: Diagnosis not present

## 2016-05-09 DIAGNOSIS — D631 Anemia in chronic kidney disease: Secondary | ICD-10-CM | POA: Diagnosis not present

## 2016-05-12 DIAGNOSIS — D631 Anemia in chronic kidney disease: Secondary | ICD-10-CM | POA: Diagnosis not present

## 2016-05-12 DIAGNOSIS — E1129 Type 2 diabetes mellitus with other diabetic kidney complication: Secondary | ICD-10-CM | POA: Diagnosis not present

## 2016-05-12 DIAGNOSIS — N2581 Secondary hyperparathyroidism of renal origin: Secondary | ICD-10-CM | POA: Diagnosis not present

## 2016-05-12 DIAGNOSIS — N186 End stage renal disease: Secondary | ICD-10-CM | POA: Diagnosis not present

## 2016-05-14 DIAGNOSIS — N2581 Secondary hyperparathyroidism of renal origin: Secondary | ICD-10-CM | POA: Diagnosis not present

## 2016-05-14 DIAGNOSIS — E1129 Type 2 diabetes mellitus with other diabetic kidney complication: Secondary | ICD-10-CM | POA: Diagnosis not present

## 2016-05-14 DIAGNOSIS — N186 End stage renal disease: Secondary | ICD-10-CM | POA: Diagnosis not present

## 2016-05-14 DIAGNOSIS — D631 Anemia in chronic kidney disease: Secondary | ICD-10-CM | POA: Diagnosis not present

## 2016-05-16 DIAGNOSIS — E1129 Type 2 diabetes mellitus with other diabetic kidney complication: Secondary | ICD-10-CM | POA: Diagnosis not present

## 2016-05-16 DIAGNOSIS — N186 End stage renal disease: Secondary | ICD-10-CM | POA: Diagnosis not present

## 2016-05-16 DIAGNOSIS — D631 Anemia in chronic kidney disease: Secondary | ICD-10-CM | POA: Diagnosis not present

## 2016-05-16 DIAGNOSIS — N2581 Secondary hyperparathyroidism of renal origin: Secondary | ICD-10-CM | POA: Diagnosis not present

## 2016-05-19 DIAGNOSIS — Z992 Dependence on renal dialysis: Secondary | ICD-10-CM | POA: Diagnosis not present

## 2016-05-19 DIAGNOSIS — N186 End stage renal disease: Secondary | ICD-10-CM | POA: Diagnosis not present

## 2016-05-19 DIAGNOSIS — N2581 Secondary hyperparathyroidism of renal origin: Secondary | ICD-10-CM | POA: Diagnosis not present

## 2016-05-19 DIAGNOSIS — D631 Anemia in chronic kidney disease: Secondary | ICD-10-CM | POA: Diagnosis not present

## 2016-05-19 DIAGNOSIS — E1129 Type 2 diabetes mellitus with other diabetic kidney complication: Secondary | ICD-10-CM | POA: Diagnosis not present

## 2016-05-21 DIAGNOSIS — D631 Anemia in chronic kidney disease: Secondary | ICD-10-CM | POA: Diagnosis not present

## 2016-05-21 DIAGNOSIS — D509 Iron deficiency anemia, unspecified: Secondary | ICD-10-CM | POA: Diagnosis not present

## 2016-05-21 DIAGNOSIS — N186 End stage renal disease: Secondary | ICD-10-CM | POA: Diagnosis not present

## 2016-05-21 DIAGNOSIS — E1129 Type 2 diabetes mellitus with other diabetic kidney complication: Secondary | ICD-10-CM | POA: Diagnosis not present

## 2016-05-21 DIAGNOSIS — N2581 Secondary hyperparathyroidism of renal origin: Secondary | ICD-10-CM | POA: Diagnosis not present

## 2016-05-23 DIAGNOSIS — D631 Anemia in chronic kidney disease: Secondary | ICD-10-CM | POA: Diagnosis not present

## 2016-05-23 DIAGNOSIS — N186 End stage renal disease: Secondary | ICD-10-CM | POA: Diagnosis not present

## 2016-05-23 DIAGNOSIS — N2581 Secondary hyperparathyroidism of renal origin: Secondary | ICD-10-CM | POA: Diagnosis not present

## 2016-05-23 DIAGNOSIS — E1129 Type 2 diabetes mellitus with other diabetic kidney complication: Secondary | ICD-10-CM | POA: Diagnosis not present

## 2016-05-23 DIAGNOSIS — D509 Iron deficiency anemia, unspecified: Secondary | ICD-10-CM | POA: Diagnosis not present

## 2016-05-26 DIAGNOSIS — N2581 Secondary hyperparathyroidism of renal origin: Secondary | ICD-10-CM | POA: Diagnosis not present

## 2016-05-26 DIAGNOSIS — E1129 Type 2 diabetes mellitus with other diabetic kidney complication: Secondary | ICD-10-CM | POA: Diagnosis not present

## 2016-05-26 DIAGNOSIS — D631 Anemia in chronic kidney disease: Secondary | ICD-10-CM | POA: Diagnosis not present

## 2016-05-26 DIAGNOSIS — D509 Iron deficiency anemia, unspecified: Secondary | ICD-10-CM | POA: Diagnosis not present

## 2016-05-26 DIAGNOSIS — N186 End stage renal disease: Secondary | ICD-10-CM | POA: Diagnosis not present

## 2016-05-28 DIAGNOSIS — D631 Anemia in chronic kidney disease: Secondary | ICD-10-CM | POA: Diagnosis not present

## 2016-05-28 DIAGNOSIS — E1129 Type 2 diabetes mellitus with other diabetic kidney complication: Secondary | ICD-10-CM | POA: Diagnosis not present

## 2016-05-28 DIAGNOSIS — D509 Iron deficiency anemia, unspecified: Secondary | ICD-10-CM | POA: Diagnosis not present

## 2016-05-28 DIAGNOSIS — N186 End stage renal disease: Secondary | ICD-10-CM | POA: Diagnosis not present

## 2016-05-28 DIAGNOSIS — N2581 Secondary hyperparathyroidism of renal origin: Secondary | ICD-10-CM | POA: Diagnosis not present

## 2016-05-30 DIAGNOSIS — D509 Iron deficiency anemia, unspecified: Secondary | ICD-10-CM | POA: Diagnosis not present

## 2016-05-30 DIAGNOSIS — D631 Anemia in chronic kidney disease: Secondary | ICD-10-CM | POA: Diagnosis not present

## 2016-05-30 DIAGNOSIS — N2581 Secondary hyperparathyroidism of renal origin: Secondary | ICD-10-CM | POA: Diagnosis not present

## 2016-05-30 DIAGNOSIS — E1129 Type 2 diabetes mellitus with other diabetic kidney complication: Secondary | ICD-10-CM | POA: Diagnosis not present

## 2016-05-30 DIAGNOSIS — N186 End stage renal disease: Secondary | ICD-10-CM | POA: Diagnosis not present

## 2016-06-02 DIAGNOSIS — N186 End stage renal disease: Secondary | ICD-10-CM | POA: Diagnosis not present

## 2016-06-02 DIAGNOSIS — E1129 Type 2 diabetes mellitus with other diabetic kidney complication: Secondary | ICD-10-CM | POA: Diagnosis not present

## 2016-06-02 DIAGNOSIS — D631 Anemia in chronic kidney disease: Secondary | ICD-10-CM | POA: Diagnosis not present

## 2016-06-02 DIAGNOSIS — D509 Iron deficiency anemia, unspecified: Secondary | ICD-10-CM | POA: Diagnosis not present

## 2016-06-02 DIAGNOSIS — N2581 Secondary hyperparathyroidism of renal origin: Secondary | ICD-10-CM | POA: Diagnosis not present

## 2016-06-04 DIAGNOSIS — N186 End stage renal disease: Secondary | ICD-10-CM | POA: Diagnosis not present

## 2016-06-04 DIAGNOSIS — D631 Anemia in chronic kidney disease: Secondary | ICD-10-CM | POA: Diagnosis not present

## 2016-06-04 DIAGNOSIS — N2581 Secondary hyperparathyroidism of renal origin: Secondary | ICD-10-CM | POA: Diagnosis not present

## 2016-06-04 DIAGNOSIS — D509 Iron deficiency anemia, unspecified: Secondary | ICD-10-CM | POA: Diagnosis not present

## 2016-06-04 DIAGNOSIS — E1129 Type 2 diabetes mellitus with other diabetic kidney complication: Secondary | ICD-10-CM | POA: Diagnosis not present

## 2016-06-06 DIAGNOSIS — E1129 Type 2 diabetes mellitus with other diabetic kidney complication: Secondary | ICD-10-CM | POA: Diagnosis not present

## 2016-06-06 DIAGNOSIS — N2581 Secondary hyperparathyroidism of renal origin: Secondary | ICD-10-CM | POA: Diagnosis not present

## 2016-06-06 DIAGNOSIS — D509 Iron deficiency anemia, unspecified: Secondary | ICD-10-CM | POA: Diagnosis not present

## 2016-06-06 DIAGNOSIS — D631 Anemia in chronic kidney disease: Secondary | ICD-10-CM | POA: Diagnosis not present

## 2016-06-06 DIAGNOSIS — N186 End stage renal disease: Secondary | ICD-10-CM | POA: Diagnosis not present

## 2016-06-09 DIAGNOSIS — D509 Iron deficiency anemia, unspecified: Secondary | ICD-10-CM | POA: Diagnosis not present

## 2016-06-09 DIAGNOSIS — D631 Anemia in chronic kidney disease: Secondary | ICD-10-CM | POA: Diagnosis not present

## 2016-06-09 DIAGNOSIS — E1129 Type 2 diabetes mellitus with other diabetic kidney complication: Secondary | ICD-10-CM | POA: Diagnosis not present

## 2016-06-09 DIAGNOSIS — N2581 Secondary hyperparathyroidism of renal origin: Secondary | ICD-10-CM | POA: Diagnosis not present

## 2016-06-09 DIAGNOSIS — N186 End stage renal disease: Secondary | ICD-10-CM | POA: Diagnosis not present

## 2016-06-11 DIAGNOSIS — D631 Anemia in chronic kidney disease: Secondary | ICD-10-CM | POA: Diagnosis not present

## 2016-06-11 DIAGNOSIS — E1129 Type 2 diabetes mellitus with other diabetic kidney complication: Secondary | ICD-10-CM | POA: Diagnosis not present

## 2016-06-11 DIAGNOSIS — D509 Iron deficiency anemia, unspecified: Secondary | ICD-10-CM | POA: Diagnosis not present

## 2016-06-11 DIAGNOSIS — N186 End stage renal disease: Secondary | ICD-10-CM | POA: Diagnosis not present

## 2016-06-11 DIAGNOSIS — N2581 Secondary hyperparathyroidism of renal origin: Secondary | ICD-10-CM | POA: Diagnosis not present

## 2016-06-13 DIAGNOSIS — E1129 Type 2 diabetes mellitus with other diabetic kidney complication: Secondary | ICD-10-CM | POA: Diagnosis not present

## 2016-06-13 DIAGNOSIS — D509 Iron deficiency anemia, unspecified: Secondary | ICD-10-CM | POA: Diagnosis not present

## 2016-06-13 DIAGNOSIS — N186 End stage renal disease: Secondary | ICD-10-CM | POA: Diagnosis not present

## 2016-06-13 DIAGNOSIS — D631 Anemia in chronic kidney disease: Secondary | ICD-10-CM | POA: Diagnosis not present

## 2016-06-13 DIAGNOSIS — N2581 Secondary hyperparathyroidism of renal origin: Secondary | ICD-10-CM | POA: Diagnosis not present

## 2016-06-16 DIAGNOSIS — E1129 Type 2 diabetes mellitus with other diabetic kidney complication: Secondary | ICD-10-CM | POA: Diagnosis not present

## 2016-06-16 DIAGNOSIS — N186 End stage renal disease: Secondary | ICD-10-CM | POA: Diagnosis not present

## 2016-06-16 DIAGNOSIS — N2581 Secondary hyperparathyroidism of renal origin: Secondary | ICD-10-CM | POA: Diagnosis not present

## 2016-06-16 DIAGNOSIS — D631 Anemia in chronic kidney disease: Secondary | ICD-10-CM | POA: Diagnosis not present

## 2016-06-16 DIAGNOSIS — D509 Iron deficiency anemia, unspecified: Secondary | ICD-10-CM | POA: Diagnosis not present

## 2016-06-18 DIAGNOSIS — N186 End stage renal disease: Secondary | ICD-10-CM | POA: Diagnosis not present

## 2016-06-18 DIAGNOSIS — D509 Iron deficiency anemia, unspecified: Secondary | ICD-10-CM | POA: Diagnosis not present

## 2016-06-18 DIAGNOSIS — D631 Anemia in chronic kidney disease: Secondary | ICD-10-CM | POA: Diagnosis not present

## 2016-06-18 DIAGNOSIS — N2581 Secondary hyperparathyroidism of renal origin: Secondary | ICD-10-CM | POA: Diagnosis not present

## 2016-06-18 DIAGNOSIS — E1129 Type 2 diabetes mellitus with other diabetic kidney complication: Secondary | ICD-10-CM | POA: Diagnosis not present

## 2016-06-19 DIAGNOSIS — Z992 Dependence on renal dialysis: Secondary | ICD-10-CM | POA: Diagnosis not present

## 2016-06-19 DIAGNOSIS — N186 End stage renal disease: Secondary | ICD-10-CM | POA: Diagnosis not present

## 2016-06-19 DIAGNOSIS — E1129 Type 2 diabetes mellitus with other diabetic kidney complication: Secondary | ICD-10-CM | POA: Diagnosis not present

## 2016-06-20 DIAGNOSIS — D631 Anemia in chronic kidney disease: Secondary | ICD-10-CM | POA: Diagnosis not present

## 2016-06-20 DIAGNOSIS — D509 Iron deficiency anemia, unspecified: Secondary | ICD-10-CM | POA: Diagnosis not present

## 2016-06-20 DIAGNOSIS — N186 End stage renal disease: Secondary | ICD-10-CM | POA: Diagnosis not present

## 2016-06-20 DIAGNOSIS — N2581 Secondary hyperparathyroidism of renal origin: Secondary | ICD-10-CM | POA: Diagnosis not present

## 2016-06-20 DIAGNOSIS — E1129 Type 2 diabetes mellitus with other diabetic kidney complication: Secondary | ICD-10-CM | POA: Diagnosis not present

## 2016-06-24 DIAGNOSIS — N186 End stage renal disease: Secondary | ICD-10-CM | POA: Diagnosis not present

## 2016-06-24 DIAGNOSIS — D509 Iron deficiency anemia, unspecified: Secondary | ICD-10-CM | POA: Diagnosis not present

## 2016-06-24 DIAGNOSIS — N2581 Secondary hyperparathyroidism of renal origin: Secondary | ICD-10-CM | POA: Diagnosis not present

## 2016-06-26 DIAGNOSIS — N2581 Secondary hyperparathyroidism of renal origin: Secondary | ICD-10-CM | POA: Diagnosis not present

## 2016-06-26 DIAGNOSIS — N186 End stage renal disease: Secondary | ICD-10-CM | POA: Diagnosis not present

## 2016-06-26 DIAGNOSIS — D509 Iron deficiency anemia, unspecified: Secondary | ICD-10-CM | POA: Diagnosis not present

## 2016-07-19 DIAGNOSIS — E1129 Type 2 diabetes mellitus with other diabetic kidney complication: Secondary | ICD-10-CM | POA: Diagnosis not present

## 2016-07-19 DIAGNOSIS — Z992 Dependence on renal dialysis: Secondary | ICD-10-CM | POA: Diagnosis not present

## 2016-07-19 DIAGNOSIS — N186 End stage renal disease: Secondary | ICD-10-CM | POA: Diagnosis not present

## 2016-07-21 DIAGNOSIS — D509 Iron deficiency anemia, unspecified: Secondary | ICD-10-CM | POA: Diagnosis not present

## 2016-07-21 DIAGNOSIS — N186 End stage renal disease: Secondary | ICD-10-CM | POA: Diagnosis not present

## 2016-07-21 DIAGNOSIS — N2581 Secondary hyperparathyroidism of renal origin: Secondary | ICD-10-CM | POA: Diagnosis not present

## 2016-07-21 DIAGNOSIS — D631 Anemia in chronic kidney disease: Secondary | ICD-10-CM | POA: Diagnosis not present

## 2016-08-13 DIAGNOSIS — E1129 Type 2 diabetes mellitus with other diabetic kidney complication: Secondary | ICD-10-CM | POA: Diagnosis not present

## 2016-08-19 DIAGNOSIS — N186 End stage renal disease: Secondary | ICD-10-CM | POA: Diagnosis not present

## 2016-08-19 DIAGNOSIS — Z992 Dependence on renal dialysis: Secondary | ICD-10-CM | POA: Diagnosis not present

## 2016-08-19 DIAGNOSIS — E1129 Type 2 diabetes mellitus with other diabetic kidney complication: Secondary | ICD-10-CM | POA: Diagnosis not present

## 2016-08-20 DIAGNOSIS — N2581 Secondary hyperparathyroidism of renal origin: Secondary | ICD-10-CM | POA: Diagnosis not present

## 2016-08-20 DIAGNOSIS — E1129 Type 2 diabetes mellitus with other diabetic kidney complication: Secondary | ICD-10-CM | POA: Diagnosis not present

## 2016-08-20 DIAGNOSIS — D509 Iron deficiency anemia, unspecified: Secondary | ICD-10-CM | POA: Diagnosis not present

## 2016-08-20 DIAGNOSIS — D631 Anemia in chronic kidney disease: Secondary | ICD-10-CM | POA: Diagnosis not present

## 2016-08-20 DIAGNOSIS — N186 End stage renal disease: Secondary | ICD-10-CM | POA: Diagnosis not present

## 2016-08-22 DIAGNOSIS — N2581 Secondary hyperparathyroidism of renal origin: Secondary | ICD-10-CM | POA: Diagnosis not present

## 2016-08-22 DIAGNOSIS — E1129 Type 2 diabetes mellitus with other diabetic kidney complication: Secondary | ICD-10-CM | POA: Diagnosis not present

## 2016-08-22 DIAGNOSIS — N186 End stage renal disease: Secondary | ICD-10-CM | POA: Diagnosis not present

## 2016-08-22 DIAGNOSIS — D631 Anemia in chronic kidney disease: Secondary | ICD-10-CM | POA: Diagnosis not present

## 2016-08-22 DIAGNOSIS — D509 Iron deficiency anemia, unspecified: Secondary | ICD-10-CM | POA: Diagnosis not present

## 2016-08-25 DIAGNOSIS — N186 End stage renal disease: Secondary | ICD-10-CM | POA: Diagnosis not present

## 2016-08-25 DIAGNOSIS — E1129 Type 2 diabetes mellitus with other diabetic kidney complication: Secondary | ICD-10-CM | POA: Diagnosis not present

## 2016-08-25 DIAGNOSIS — D509 Iron deficiency anemia, unspecified: Secondary | ICD-10-CM | POA: Diagnosis not present

## 2016-08-25 DIAGNOSIS — N2581 Secondary hyperparathyroidism of renal origin: Secondary | ICD-10-CM | POA: Diagnosis not present

## 2016-08-25 DIAGNOSIS — D631 Anemia in chronic kidney disease: Secondary | ICD-10-CM | POA: Diagnosis not present

## 2016-08-27 DIAGNOSIS — E1129 Type 2 diabetes mellitus with other diabetic kidney complication: Secondary | ICD-10-CM | POA: Diagnosis not present

## 2016-08-27 DIAGNOSIS — D631 Anemia in chronic kidney disease: Secondary | ICD-10-CM | POA: Diagnosis not present

## 2016-08-27 DIAGNOSIS — D509 Iron deficiency anemia, unspecified: Secondary | ICD-10-CM | POA: Diagnosis not present

## 2016-08-27 DIAGNOSIS — N186 End stage renal disease: Secondary | ICD-10-CM | POA: Diagnosis not present

## 2016-08-27 DIAGNOSIS — N2581 Secondary hyperparathyroidism of renal origin: Secondary | ICD-10-CM | POA: Diagnosis not present

## 2016-08-29 DIAGNOSIS — D509 Iron deficiency anemia, unspecified: Secondary | ICD-10-CM | POA: Diagnosis not present

## 2016-08-29 DIAGNOSIS — N2581 Secondary hyperparathyroidism of renal origin: Secondary | ICD-10-CM | POA: Diagnosis not present

## 2016-08-29 DIAGNOSIS — E1129 Type 2 diabetes mellitus with other diabetic kidney complication: Secondary | ICD-10-CM | POA: Diagnosis not present

## 2016-08-29 DIAGNOSIS — D631 Anemia in chronic kidney disease: Secondary | ICD-10-CM | POA: Diagnosis not present

## 2016-08-29 DIAGNOSIS — N186 End stage renal disease: Secondary | ICD-10-CM | POA: Diagnosis not present

## 2016-09-01 DIAGNOSIS — N186 End stage renal disease: Secondary | ICD-10-CM | POA: Diagnosis not present

## 2016-09-01 DIAGNOSIS — D631 Anemia in chronic kidney disease: Secondary | ICD-10-CM | POA: Diagnosis not present

## 2016-09-01 DIAGNOSIS — E1129 Type 2 diabetes mellitus with other diabetic kidney complication: Secondary | ICD-10-CM | POA: Diagnosis not present

## 2016-09-01 DIAGNOSIS — N2581 Secondary hyperparathyroidism of renal origin: Secondary | ICD-10-CM | POA: Diagnosis not present

## 2016-09-01 DIAGNOSIS — D509 Iron deficiency anemia, unspecified: Secondary | ICD-10-CM | POA: Diagnosis not present

## 2016-09-03 DIAGNOSIS — N186 End stage renal disease: Secondary | ICD-10-CM | POA: Diagnosis not present

## 2016-09-03 DIAGNOSIS — N2581 Secondary hyperparathyroidism of renal origin: Secondary | ICD-10-CM | POA: Diagnosis not present

## 2016-09-03 DIAGNOSIS — D509 Iron deficiency anemia, unspecified: Secondary | ICD-10-CM | POA: Diagnosis not present

## 2016-09-03 DIAGNOSIS — D631 Anemia in chronic kidney disease: Secondary | ICD-10-CM | POA: Diagnosis not present

## 2016-09-03 DIAGNOSIS — E1129 Type 2 diabetes mellitus with other diabetic kidney complication: Secondary | ICD-10-CM | POA: Diagnosis not present

## 2016-09-05 DIAGNOSIS — N186 End stage renal disease: Secondary | ICD-10-CM | POA: Diagnosis not present

## 2016-09-05 DIAGNOSIS — D509 Iron deficiency anemia, unspecified: Secondary | ICD-10-CM | POA: Diagnosis not present

## 2016-09-05 DIAGNOSIS — D631 Anemia in chronic kidney disease: Secondary | ICD-10-CM | POA: Diagnosis not present

## 2016-09-05 DIAGNOSIS — N2581 Secondary hyperparathyroidism of renal origin: Secondary | ICD-10-CM | POA: Diagnosis not present

## 2016-09-05 DIAGNOSIS — E1129 Type 2 diabetes mellitus with other diabetic kidney complication: Secondary | ICD-10-CM | POA: Diagnosis not present

## 2016-09-07 DIAGNOSIS — E1129 Type 2 diabetes mellitus with other diabetic kidney complication: Secondary | ICD-10-CM | POA: Diagnosis not present

## 2016-09-07 DIAGNOSIS — D631 Anemia in chronic kidney disease: Secondary | ICD-10-CM | POA: Diagnosis not present

## 2016-09-07 DIAGNOSIS — N186 End stage renal disease: Secondary | ICD-10-CM | POA: Diagnosis not present

## 2016-09-07 DIAGNOSIS — N2581 Secondary hyperparathyroidism of renal origin: Secondary | ICD-10-CM | POA: Diagnosis not present

## 2016-09-07 DIAGNOSIS — D509 Iron deficiency anemia, unspecified: Secondary | ICD-10-CM | POA: Diagnosis not present

## 2016-09-09 DIAGNOSIS — N186 End stage renal disease: Secondary | ICD-10-CM | POA: Diagnosis not present

## 2016-09-09 DIAGNOSIS — E1129 Type 2 diabetes mellitus with other diabetic kidney complication: Secondary | ICD-10-CM | POA: Diagnosis not present

## 2016-09-09 DIAGNOSIS — D509 Iron deficiency anemia, unspecified: Secondary | ICD-10-CM | POA: Diagnosis not present

## 2016-09-09 DIAGNOSIS — D631 Anemia in chronic kidney disease: Secondary | ICD-10-CM | POA: Diagnosis not present

## 2016-09-09 DIAGNOSIS — N2581 Secondary hyperparathyroidism of renal origin: Secondary | ICD-10-CM | POA: Diagnosis not present

## 2016-09-12 DIAGNOSIS — D631 Anemia in chronic kidney disease: Secondary | ICD-10-CM | POA: Diagnosis not present

## 2016-09-12 DIAGNOSIS — N186 End stage renal disease: Secondary | ICD-10-CM | POA: Diagnosis not present

## 2016-09-12 DIAGNOSIS — D509 Iron deficiency anemia, unspecified: Secondary | ICD-10-CM | POA: Diagnosis not present

## 2016-09-12 DIAGNOSIS — N2581 Secondary hyperparathyroidism of renal origin: Secondary | ICD-10-CM | POA: Diagnosis not present

## 2016-09-12 DIAGNOSIS — E1129 Type 2 diabetes mellitus with other diabetic kidney complication: Secondary | ICD-10-CM | POA: Diagnosis not present

## 2016-09-15 DIAGNOSIS — D509 Iron deficiency anemia, unspecified: Secondary | ICD-10-CM | POA: Diagnosis not present

## 2016-09-15 DIAGNOSIS — N186 End stage renal disease: Secondary | ICD-10-CM | POA: Diagnosis not present

## 2016-09-15 DIAGNOSIS — D631 Anemia in chronic kidney disease: Secondary | ICD-10-CM | POA: Diagnosis not present

## 2016-09-15 DIAGNOSIS — E1129 Type 2 diabetes mellitus with other diabetic kidney complication: Secondary | ICD-10-CM | POA: Diagnosis not present

## 2016-09-15 DIAGNOSIS — N2581 Secondary hyperparathyroidism of renal origin: Secondary | ICD-10-CM | POA: Diagnosis not present

## 2016-09-17 DIAGNOSIS — N2581 Secondary hyperparathyroidism of renal origin: Secondary | ICD-10-CM | POA: Diagnosis not present

## 2016-09-17 DIAGNOSIS — E1129 Type 2 diabetes mellitus with other diabetic kidney complication: Secondary | ICD-10-CM | POA: Diagnosis not present

## 2016-09-17 DIAGNOSIS — D631 Anemia in chronic kidney disease: Secondary | ICD-10-CM | POA: Diagnosis not present

## 2016-09-17 DIAGNOSIS — D509 Iron deficiency anemia, unspecified: Secondary | ICD-10-CM | POA: Diagnosis not present

## 2016-09-17 DIAGNOSIS — N186 End stage renal disease: Secondary | ICD-10-CM | POA: Diagnosis not present

## 2016-09-18 DIAGNOSIS — E1129 Type 2 diabetes mellitus with other diabetic kidney complication: Secondary | ICD-10-CM | POA: Diagnosis not present

## 2016-09-18 DIAGNOSIS — N186 End stage renal disease: Secondary | ICD-10-CM | POA: Diagnosis not present

## 2016-09-18 DIAGNOSIS — Z992 Dependence on renal dialysis: Secondary | ICD-10-CM | POA: Diagnosis not present

## 2016-09-19 DIAGNOSIS — N186 End stage renal disease: Secondary | ICD-10-CM | POA: Diagnosis not present

## 2016-09-19 DIAGNOSIS — D631 Anemia in chronic kidney disease: Secondary | ICD-10-CM | POA: Diagnosis not present

## 2016-09-19 DIAGNOSIS — N2581 Secondary hyperparathyroidism of renal origin: Secondary | ICD-10-CM | POA: Diagnosis not present

## 2016-09-19 DIAGNOSIS — E1129 Type 2 diabetes mellitus with other diabetic kidney complication: Secondary | ICD-10-CM | POA: Diagnosis not present

## 2016-09-19 DIAGNOSIS — D509 Iron deficiency anemia, unspecified: Secondary | ICD-10-CM | POA: Diagnosis not present

## 2016-09-22 DIAGNOSIS — D631 Anemia in chronic kidney disease: Secondary | ICD-10-CM | POA: Diagnosis not present

## 2016-09-22 DIAGNOSIS — D509 Iron deficiency anemia, unspecified: Secondary | ICD-10-CM | POA: Diagnosis not present

## 2016-09-22 DIAGNOSIS — N2581 Secondary hyperparathyroidism of renal origin: Secondary | ICD-10-CM | POA: Diagnosis not present

## 2016-09-22 DIAGNOSIS — N186 End stage renal disease: Secondary | ICD-10-CM | POA: Diagnosis not present

## 2016-09-22 DIAGNOSIS — E1129 Type 2 diabetes mellitus with other diabetic kidney complication: Secondary | ICD-10-CM | POA: Diagnosis not present

## 2016-09-24 DIAGNOSIS — N186 End stage renal disease: Secondary | ICD-10-CM | POA: Diagnosis not present

## 2016-09-24 DIAGNOSIS — E1129 Type 2 diabetes mellitus with other diabetic kidney complication: Secondary | ICD-10-CM | POA: Diagnosis not present

## 2016-09-24 DIAGNOSIS — N2581 Secondary hyperparathyroidism of renal origin: Secondary | ICD-10-CM | POA: Diagnosis not present

## 2016-09-24 DIAGNOSIS — D631 Anemia in chronic kidney disease: Secondary | ICD-10-CM | POA: Diagnosis not present

## 2016-09-24 DIAGNOSIS — D509 Iron deficiency anemia, unspecified: Secondary | ICD-10-CM | POA: Diagnosis not present

## 2016-09-26 DIAGNOSIS — N2581 Secondary hyperparathyroidism of renal origin: Secondary | ICD-10-CM | POA: Diagnosis not present

## 2016-09-26 DIAGNOSIS — E1129 Type 2 diabetes mellitus with other diabetic kidney complication: Secondary | ICD-10-CM | POA: Diagnosis not present

## 2016-09-26 DIAGNOSIS — N186 End stage renal disease: Secondary | ICD-10-CM | POA: Diagnosis not present

## 2016-09-26 DIAGNOSIS — D631 Anemia in chronic kidney disease: Secondary | ICD-10-CM | POA: Diagnosis not present

## 2016-09-26 DIAGNOSIS — D509 Iron deficiency anemia, unspecified: Secondary | ICD-10-CM | POA: Diagnosis not present

## 2016-09-29 DIAGNOSIS — E1129 Type 2 diabetes mellitus with other diabetic kidney complication: Secondary | ICD-10-CM | POA: Diagnosis not present

## 2016-09-29 DIAGNOSIS — D509 Iron deficiency anemia, unspecified: Secondary | ICD-10-CM | POA: Diagnosis not present

## 2016-09-29 DIAGNOSIS — N2581 Secondary hyperparathyroidism of renal origin: Secondary | ICD-10-CM | POA: Diagnosis not present

## 2016-09-29 DIAGNOSIS — D631 Anemia in chronic kidney disease: Secondary | ICD-10-CM | POA: Diagnosis not present

## 2016-09-29 DIAGNOSIS — N186 End stage renal disease: Secondary | ICD-10-CM | POA: Diagnosis not present

## 2016-10-01 DIAGNOSIS — N2581 Secondary hyperparathyroidism of renal origin: Secondary | ICD-10-CM | POA: Diagnosis not present

## 2016-10-01 DIAGNOSIS — D631 Anemia in chronic kidney disease: Secondary | ICD-10-CM | POA: Diagnosis not present

## 2016-10-01 DIAGNOSIS — N186 End stage renal disease: Secondary | ICD-10-CM | POA: Diagnosis not present

## 2016-10-01 DIAGNOSIS — D509 Iron deficiency anemia, unspecified: Secondary | ICD-10-CM | POA: Diagnosis not present

## 2016-10-01 DIAGNOSIS — E1129 Type 2 diabetes mellitus with other diabetic kidney complication: Secondary | ICD-10-CM | POA: Diagnosis not present

## 2016-10-03 DIAGNOSIS — D631 Anemia in chronic kidney disease: Secondary | ICD-10-CM | POA: Diagnosis not present

## 2016-10-03 DIAGNOSIS — N2581 Secondary hyperparathyroidism of renal origin: Secondary | ICD-10-CM | POA: Diagnosis not present

## 2016-10-03 DIAGNOSIS — N186 End stage renal disease: Secondary | ICD-10-CM | POA: Diagnosis not present

## 2016-10-03 DIAGNOSIS — E1129 Type 2 diabetes mellitus with other diabetic kidney complication: Secondary | ICD-10-CM | POA: Diagnosis not present

## 2016-10-03 DIAGNOSIS — D509 Iron deficiency anemia, unspecified: Secondary | ICD-10-CM | POA: Diagnosis not present

## 2016-10-06 DIAGNOSIS — D631 Anemia in chronic kidney disease: Secondary | ICD-10-CM | POA: Diagnosis not present

## 2016-10-06 DIAGNOSIS — N2581 Secondary hyperparathyroidism of renal origin: Secondary | ICD-10-CM | POA: Diagnosis not present

## 2016-10-06 DIAGNOSIS — N186 End stage renal disease: Secondary | ICD-10-CM | POA: Diagnosis not present

## 2016-10-06 DIAGNOSIS — D509 Iron deficiency anemia, unspecified: Secondary | ICD-10-CM | POA: Diagnosis not present

## 2016-10-06 DIAGNOSIS — E1129 Type 2 diabetes mellitus with other diabetic kidney complication: Secondary | ICD-10-CM | POA: Diagnosis not present

## 2016-10-08 DIAGNOSIS — D509 Iron deficiency anemia, unspecified: Secondary | ICD-10-CM | POA: Diagnosis not present

## 2016-10-08 DIAGNOSIS — N186 End stage renal disease: Secondary | ICD-10-CM | POA: Diagnosis not present

## 2016-10-08 DIAGNOSIS — D631 Anemia in chronic kidney disease: Secondary | ICD-10-CM | POA: Diagnosis not present

## 2016-10-08 DIAGNOSIS — N2581 Secondary hyperparathyroidism of renal origin: Secondary | ICD-10-CM | POA: Diagnosis not present

## 2016-10-08 DIAGNOSIS — E1129 Type 2 diabetes mellitus with other diabetic kidney complication: Secondary | ICD-10-CM | POA: Diagnosis not present

## 2016-10-10 DIAGNOSIS — N186 End stage renal disease: Secondary | ICD-10-CM | POA: Diagnosis not present

## 2016-10-10 DIAGNOSIS — N2581 Secondary hyperparathyroidism of renal origin: Secondary | ICD-10-CM | POA: Diagnosis not present

## 2016-10-10 DIAGNOSIS — D509 Iron deficiency anemia, unspecified: Secondary | ICD-10-CM | POA: Diagnosis not present

## 2016-10-10 DIAGNOSIS — D631 Anemia in chronic kidney disease: Secondary | ICD-10-CM | POA: Diagnosis not present

## 2016-10-10 DIAGNOSIS — E1129 Type 2 diabetes mellitus with other diabetic kidney complication: Secondary | ICD-10-CM | POA: Diagnosis not present

## 2016-10-14 DIAGNOSIS — E1129 Type 2 diabetes mellitus with other diabetic kidney complication: Secondary | ICD-10-CM | POA: Diagnosis not present

## 2016-10-14 DIAGNOSIS — D509 Iron deficiency anemia, unspecified: Secondary | ICD-10-CM | POA: Diagnosis not present

## 2016-10-14 DIAGNOSIS — D631 Anemia in chronic kidney disease: Secondary | ICD-10-CM | POA: Diagnosis not present

## 2016-10-14 DIAGNOSIS — N186 End stage renal disease: Secondary | ICD-10-CM | POA: Diagnosis not present

## 2016-10-14 DIAGNOSIS — N2581 Secondary hyperparathyroidism of renal origin: Secondary | ICD-10-CM | POA: Diagnosis not present

## 2016-10-15 DIAGNOSIS — D631 Anemia in chronic kidney disease: Secondary | ICD-10-CM | POA: Diagnosis not present

## 2016-10-15 DIAGNOSIS — N2581 Secondary hyperparathyroidism of renal origin: Secondary | ICD-10-CM | POA: Diagnosis not present

## 2016-10-15 DIAGNOSIS — E1129 Type 2 diabetes mellitus with other diabetic kidney complication: Secondary | ICD-10-CM | POA: Diagnosis not present

## 2016-10-15 DIAGNOSIS — N186 End stage renal disease: Secondary | ICD-10-CM | POA: Diagnosis not present

## 2016-10-15 DIAGNOSIS — D509 Iron deficiency anemia, unspecified: Secondary | ICD-10-CM | POA: Diagnosis not present

## 2016-10-17 DIAGNOSIS — D631 Anemia in chronic kidney disease: Secondary | ICD-10-CM | POA: Diagnosis not present

## 2016-10-17 DIAGNOSIS — N2581 Secondary hyperparathyroidism of renal origin: Secondary | ICD-10-CM | POA: Diagnosis not present

## 2016-10-17 DIAGNOSIS — D509 Iron deficiency anemia, unspecified: Secondary | ICD-10-CM | POA: Diagnosis not present

## 2016-10-17 DIAGNOSIS — E1129 Type 2 diabetes mellitus with other diabetic kidney complication: Secondary | ICD-10-CM | POA: Diagnosis not present

## 2016-10-17 DIAGNOSIS — N186 End stage renal disease: Secondary | ICD-10-CM | POA: Diagnosis not present

## 2016-10-19 DIAGNOSIS — N186 End stage renal disease: Secondary | ICD-10-CM | POA: Diagnosis not present

## 2016-10-19 DIAGNOSIS — Z992 Dependence on renal dialysis: Secondary | ICD-10-CM | POA: Diagnosis not present

## 2016-10-19 DIAGNOSIS — N2581 Secondary hyperparathyroidism of renal origin: Secondary | ICD-10-CM | POA: Diagnosis not present

## 2016-10-19 DIAGNOSIS — E1129 Type 2 diabetes mellitus with other diabetic kidney complication: Secondary | ICD-10-CM | POA: Diagnosis not present

## 2016-10-19 DIAGNOSIS — D509 Iron deficiency anemia, unspecified: Secondary | ICD-10-CM | POA: Diagnosis not present

## 2016-10-19 DIAGNOSIS — D631 Anemia in chronic kidney disease: Secondary | ICD-10-CM | POA: Diagnosis not present

## 2016-10-22 DIAGNOSIS — E1129 Type 2 diabetes mellitus with other diabetic kidney complication: Secondary | ICD-10-CM | POA: Diagnosis not present

## 2016-10-22 DIAGNOSIS — D631 Anemia in chronic kidney disease: Secondary | ICD-10-CM | POA: Diagnosis not present

## 2016-10-22 DIAGNOSIS — N2581 Secondary hyperparathyroidism of renal origin: Secondary | ICD-10-CM | POA: Diagnosis not present

## 2016-10-22 DIAGNOSIS — N186 End stage renal disease: Secondary | ICD-10-CM | POA: Diagnosis not present

## 2016-10-24 DIAGNOSIS — E1129 Type 2 diabetes mellitus with other diabetic kidney complication: Secondary | ICD-10-CM | POA: Diagnosis not present

## 2016-10-24 DIAGNOSIS — D631 Anemia in chronic kidney disease: Secondary | ICD-10-CM | POA: Diagnosis not present

## 2016-10-24 DIAGNOSIS — N186 End stage renal disease: Secondary | ICD-10-CM | POA: Diagnosis not present

## 2016-10-24 DIAGNOSIS — N2581 Secondary hyperparathyroidism of renal origin: Secondary | ICD-10-CM | POA: Diagnosis not present

## 2016-10-27 DIAGNOSIS — E1129 Type 2 diabetes mellitus with other diabetic kidney complication: Secondary | ICD-10-CM | POA: Diagnosis not present

## 2016-10-27 DIAGNOSIS — N186 End stage renal disease: Secondary | ICD-10-CM | POA: Diagnosis not present

## 2016-10-27 DIAGNOSIS — N2581 Secondary hyperparathyroidism of renal origin: Secondary | ICD-10-CM | POA: Diagnosis not present

## 2016-10-27 DIAGNOSIS — D631 Anemia in chronic kidney disease: Secondary | ICD-10-CM | POA: Diagnosis not present

## 2016-10-29 DIAGNOSIS — N186 End stage renal disease: Secondary | ICD-10-CM | POA: Diagnosis not present

## 2016-10-29 DIAGNOSIS — D631 Anemia in chronic kidney disease: Secondary | ICD-10-CM | POA: Diagnosis not present

## 2016-10-29 DIAGNOSIS — E1129 Type 2 diabetes mellitus with other diabetic kidney complication: Secondary | ICD-10-CM | POA: Diagnosis not present

## 2016-10-29 DIAGNOSIS — N2581 Secondary hyperparathyroidism of renal origin: Secondary | ICD-10-CM | POA: Diagnosis not present

## 2016-10-31 DIAGNOSIS — N2581 Secondary hyperparathyroidism of renal origin: Secondary | ICD-10-CM | POA: Diagnosis not present

## 2016-10-31 DIAGNOSIS — N186 End stage renal disease: Secondary | ICD-10-CM | POA: Diagnosis not present

## 2016-10-31 DIAGNOSIS — E1129 Type 2 diabetes mellitus with other diabetic kidney complication: Secondary | ICD-10-CM | POA: Diagnosis not present

## 2016-10-31 DIAGNOSIS — D631 Anemia in chronic kidney disease: Secondary | ICD-10-CM | POA: Diagnosis not present

## 2016-11-03 DIAGNOSIS — D631 Anemia in chronic kidney disease: Secondary | ICD-10-CM | POA: Diagnosis not present

## 2016-11-03 DIAGNOSIS — N2581 Secondary hyperparathyroidism of renal origin: Secondary | ICD-10-CM | POA: Diagnosis not present

## 2016-11-03 DIAGNOSIS — E1129 Type 2 diabetes mellitus with other diabetic kidney complication: Secondary | ICD-10-CM | POA: Diagnosis not present

## 2016-11-03 DIAGNOSIS — N186 End stage renal disease: Secondary | ICD-10-CM | POA: Diagnosis not present

## 2016-11-05 DIAGNOSIS — D631 Anemia in chronic kidney disease: Secondary | ICD-10-CM | POA: Diagnosis not present

## 2016-11-05 DIAGNOSIS — N186 End stage renal disease: Secondary | ICD-10-CM | POA: Diagnosis not present

## 2016-11-05 DIAGNOSIS — E1129 Type 2 diabetes mellitus with other diabetic kidney complication: Secondary | ICD-10-CM | POA: Diagnosis not present

## 2016-11-05 DIAGNOSIS — N2581 Secondary hyperparathyroidism of renal origin: Secondary | ICD-10-CM | POA: Diagnosis not present

## 2016-11-06 DIAGNOSIS — N2581 Secondary hyperparathyroidism of renal origin: Secondary | ICD-10-CM | POA: Diagnosis not present

## 2016-11-06 DIAGNOSIS — E1129 Type 2 diabetes mellitus with other diabetic kidney complication: Secondary | ICD-10-CM | POA: Diagnosis not present

## 2016-11-06 DIAGNOSIS — D631 Anemia in chronic kidney disease: Secondary | ICD-10-CM | POA: Diagnosis not present

## 2016-11-06 DIAGNOSIS — N186 End stage renal disease: Secondary | ICD-10-CM | POA: Diagnosis not present

## 2016-11-10 DIAGNOSIS — N2581 Secondary hyperparathyroidism of renal origin: Secondary | ICD-10-CM | POA: Diagnosis not present

## 2016-11-10 DIAGNOSIS — E1129 Type 2 diabetes mellitus with other diabetic kidney complication: Secondary | ICD-10-CM | POA: Diagnosis not present

## 2016-11-10 DIAGNOSIS — D631 Anemia in chronic kidney disease: Secondary | ICD-10-CM | POA: Diagnosis not present

## 2016-11-10 DIAGNOSIS — N186 End stage renal disease: Secondary | ICD-10-CM | POA: Diagnosis not present

## 2016-11-12 DIAGNOSIS — N186 End stage renal disease: Secondary | ICD-10-CM | POA: Diagnosis not present

## 2016-11-12 DIAGNOSIS — D631 Anemia in chronic kidney disease: Secondary | ICD-10-CM | POA: Diagnosis not present

## 2016-11-12 DIAGNOSIS — N2581 Secondary hyperparathyroidism of renal origin: Secondary | ICD-10-CM | POA: Diagnosis not present

## 2016-11-12 DIAGNOSIS — E1129 Type 2 diabetes mellitus with other diabetic kidney complication: Secondary | ICD-10-CM | POA: Diagnosis not present

## 2016-11-14 DIAGNOSIS — E1129 Type 2 diabetes mellitus with other diabetic kidney complication: Secondary | ICD-10-CM | POA: Diagnosis not present

## 2016-11-14 DIAGNOSIS — N2581 Secondary hyperparathyroidism of renal origin: Secondary | ICD-10-CM | POA: Diagnosis not present

## 2016-11-14 DIAGNOSIS — D631 Anemia in chronic kidney disease: Secondary | ICD-10-CM | POA: Diagnosis not present

## 2016-11-14 DIAGNOSIS — N186 End stage renal disease: Secondary | ICD-10-CM | POA: Diagnosis not present

## 2016-11-17 DIAGNOSIS — E1129 Type 2 diabetes mellitus with other diabetic kidney complication: Secondary | ICD-10-CM | POA: Diagnosis not present

## 2016-11-17 DIAGNOSIS — N2581 Secondary hyperparathyroidism of renal origin: Secondary | ICD-10-CM | POA: Diagnosis not present

## 2016-11-17 DIAGNOSIS — N186 End stage renal disease: Secondary | ICD-10-CM | POA: Diagnosis not present

## 2016-11-17 DIAGNOSIS — D631 Anemia in chronic kidney disease: Secondary | ICD-10-CM | POA: Diagnosis not present

## 2016-11-19 DIAGNOSIS — Z992 Dependence on renal dialysis: Secondary | ICD-10-CM | POA: Diagnosis not present

## 2016-11-19 DIAGNOSIS — N2581 Secondary hyperparathyroidism of renal origin: Secondary | ICD-10-CM | POA: Diagnosis not present

## 2016-11-19 DIAGNOSIS — E1129 Type 2 diabetes mellitus with other diabetic kidney complication: Secondary | ICD-10-CM | POA: Diagnosis not present

## 2016-11-19 DIAGNOSIS — D631 Anemia in chronic kidney disease: Secondary | ICD-10-CM | POA: Diagnosis not present

## 2016-11-19 DIAGNOSIS — N186 End stage renal disease: Secondary | ICD-10-CM | POA: Diagnosis not present

## 2016-11-21 DIAGNOSIS — N186 End stage renal disease: Secondary | ICD-10-CM | POA: Diagnosis not present

## 2016-11-21 DIAGNOSIS — N2581 Secondary hyperparathyroidism of renal origin: Secondary | ICD-10-CM | POA: Diagnosis not present

## 2016-11-21 DIAGNOSIS — D631 Anemia in chronic kidney disease: Secondary | ICD-10-CM | POA: Diagnosis not present

## 2016-11-21 DIAGNOSIS — E1129 Type 2 diabetes mellitus with other diabetic kidney complication: Secondary | ICD-10-CM | POA: Diagnosis not present

## 2016-11-24 DIAGNOSIS — N2581 Secondary hyperparathyroidism of renal origin: Secondary | ICD-10-CM | POA: Diagnosis not present

## 2016-11-24 DIAGNOSIS — N186 End stage renal disease: Secondary | ICD-10-CM | POA: Diagnosis not present

## 2016-11-24 DIAGNOSIS — E1129 Type 2 diabetes mellitus with other diabetic kidney complication: Secondary | ICD-10-CM | POA: Diagnosis not present

## 2016-11-24 DIAGNOSIS — D631 Anemia in chronic kidney disease: Secondary | ICD-10-CM | POA: Diagnosis not present

## 2016-11-26 DIAGNOSIS — N186 End stage renal disease: Secondary | ICD-10-CM | POA: Diagnosis not present

## 2016-11-26 DIAGNOSIS — E1129 Type 2 diabetes mellitus with other diabetic kidney complication: Secondary | ICD-10-CM | POA: Diagnosis not present

## 2016-11-26 DIAGNOSIS — D631 Anemia in chronic kidney disease: Secondary | ICD-10-CM | POA: Diagnosis not present

## 2016-11-26 DIAGNOSIS — N2581 Secondary hyperparathyroidism of renal origin: Secondary | ICD-10-CM | POA: Diagnosis not present

## 2016-11-28 DIAGNOSIS — D631 Anemia in chronic kidney disease: Secondary | ICD-10-CM | POA: Diagnosis not present

## 2016-11-28 DIAGNOSIS — E1129 Type 2 diabetes mellitus with other diabetic kidney complication: Secondary | ICD-10-CM | POA: Diagnosis not present

## 2016-11-28 DIAGNOSIS — N2581 Secondary hyperparathyroidism of renal origin: Secondary | ICD-10-CM | POA: Diagnosis not present

## 2016-11-28 DIAGNOSIS — N186 End stage renal disease: Secondary | ICD-10-CM | POA: Diagnosis not present

## 2016-12-01 DIAGNOSIS — N2581 Secondary hyperparathyroidism of renal origin: Secondary | ICD-10-CM | POA: Diagnosis not present

## 2016-12-01 DIAGNOSIS — N186 End stage renal disease: Secondary | ICD-10-CM | POA: Diagnosis not present

## 2016-12-01 DIAGNOSIS — D631 Anemia in chronic kidney disease: Secondary | ICD-10-CM | POA: Diagnosis not present

## 2016-12-01 DIAGNOSIS — E1129 Type 2 diabetes mellitus with other diabetic kidney complication: Secondary | ICD-10-CM | POA: Diagnosis not present

## 2016-12-03 DIAGNOSIS — N2581 Secondary hyperparathyroidism of renal origin: Secondary | ICD-10-CM | POA: Diagnosis not present

## 2016-12-03 DIAGNOSIS — D631 Anemia in chronic kidney disease: Secondary | ICD-10-CM | POA: Diagnosis not present

## 2016-12-03 DIAGNOSIS — E1129 Type 2 diabetes mellitus with other diabetic kidney complication: Secondary | ICD-10-CM | POA: Diagnosis not present

## 2016-12-03 DIAGNOSIS — N186 End stage renal disease: Secondary | ICD-10-CM | POA: Diagnosis not present

## 2016-12-05 DIAGNOSIS — N186 End stage renal disease: Secondary | ICD-10-CM | POA: Diagnosis not present

## 2016-12-05 DIAGNOSIS — E1129 Type 2 diabetes mellitus with other diabetic kidney complication: Secondary | ICD-10-CM | POA: Diagnosis not present

## 2016-12-05 DIAGNOSIS — N2581 Secondary hyperparathyroidism of renal origin: Secondary | ICD-10-CM | POA: Diagnosis not present

## 2016-12-05 DIAGNOSIS — D631 Anemia in chronic kidney disease: Secondary | ICD-10-CM | POA: Diagnosis not present

## 2016-12-08 DIAGNOSIS — D631 Anemia in chronic kidney disease: Secondary | ICD-10-CM | POA: Diagnosis not present

## 2016-12-08 DIAGNOSIS — N2581 Secondary hyperparathyroidism of renal origin: Secondary | ICD-10-CM | POA: Diagnosis not present

## 2016-12-08 DIAGNOSIS — N186 End stage renal disease: Secondary | ICD-10-CM | POA: Diagnosis not present

## 2016-12-08 DIAGNOSIS — E1129 Type 2 diabetes mellitus with other diabetic kidney complication: Secondary | ICD-10-CM | POA: Diagnosis not present

## 2016-12-10 DIAGNOSIS — D631 Anemia in chronic kidney disease: Secondary | ICD-10-CM | POA: Diagnosis not present

## 2016-12-10 DIAGNOSIS — N186 End stage renal disease: Secondary | ICD-10-CM | POA: Diagnosis not present

## 2016-12-10 DIAGNOSIS — N2581 Secondary hyperparathyroidism of renal origin: Secondary | ICD-10-CM | POA: Diagnosis not present

## 2016-12-10 DIAGNOSIS — E1129 Type 2 diabetes mellitus with other diabetic kidney complication: Secondary | ICD-10-CM | POA: Diagnosis not present

## 2016-12-12 DIAGNOSIS — E1129 Type 2 diabetes mellitus with other diabetic kidney complication: Secondary | ICD-10-CM | POA: Diagnosis not present

## 2016-12-12 DIAGNOSIS — N186 End stage renal disease: Secondary | ICD-10-CM | POA: Diagnosis not present

## 2016-12-12 DIAGNOSIS — D631 Anemia in chronic kidney disease: Secondary | ICD-10-CM | POA: Diagnosis not present

## 2016-12-12 DIAGNOSIS — N2581 Secondary hyperparathyroidism of renal origin: Secondary | ICD-10-CM | POA: Diagnosis not present

## 2016-12-15 DIAGNOSIS — D631 Anemia in chronic kidney disease: Secondary | ICD-10-CM | POA: Diagnosis not present

## 2016-12-15 DIAGNOSIS — N186 End stage renal disease: Secondary | ICD-10-CM | POA: Diagnosis not present

## 2016-12-15 DIAGNOSIS — N2581 Secondary hyperparathyroidism of renal origin: Secondary | ICD-10-CM | POA: Diagnosis not present

## 2016-12-15 DIAGNOSIS — E1129 Type 2 diabetes mellitus with other diabetic kidney complication: Secondary | ICD-10-CM | POA: Diagnosis not present

## 2016-12-17 DIAGNOSIS — D631 Anemia in chronic kidney disease: Secondary | ICD-10-CM | POA: Diagnosis not present

## 2016-12-17 DIAGNOSIS — N2581 Secondary hyperparathyroidism of renal origin: Secondary | ICD-10-CM | POA: Diagnosis not present

## 2016-12-17 DIAGNOSIS — N186 End stage renal disease: Secondary | ICD-10-CM | POA: Diagnosis not present

## 2016-12-17 DIAGNOSIS — E1129 Type 2 diabetes mellitus with other diabetic kidney complication: Secondary | ICD-10-CM | POA: Diagnosis not present

## 2016-12-17 DIAGNOSIS — Z992 Dependence on renal dialysis: Secondary | ICD-10-CM | POA: Diagnosis not present

## 2016-12-19 DIAGNOSIS — E1129 Type 2 diabetes mellitus with other diabetic kidney complication: Secondary | ICD-10-CM | POA: Diagnosis not present

## 2016-12-19 DIAGNOSIS — D631 Anemia in chronic kidney disease: Secondary | ICD-10-CM | POA: Diagnosis not present

## 2016-12-19 DIAGNOSIS — N186 End stage renal disease: Secondary | ICD-10-CM | POA: Diagnosis not present

## 2016-12-19 DIAGNOSIS — Z23 Encounter for immunization: Secondary | ICD-10-CM | POA: Diagnosis not present

## 2016-12-19 DIAGNOSIS — N2581 Secondary hyperparathyroidism of renal origin: Secondary | ICD-10-CM | POA: Diagnosis not present

## 2016-12-22 DIAGNOSIS — E1129 Type 2 diabetes mellitus with other diabetic kidney complication: Secondary | ICD-10-CM | POA: Diagnosis not present

## 2016-12-22 DIAGNOSIS — N186 End stage renal disease: Secondary | ICD-10-CM | POA: Diagnosis not present

## 2016-12-22 DIAGNOSIS — N2581 Secondary hyperparathyroidism of renal origin: Secondary | ICD-10-CM | POA: Diagnosis not present

## 2016-12-22 DIAGNOSIS — Z23 Encounter for immunization: Secondary | ICD-10-CM | POA: Diagnosis not present

## 2016-12-22 DIAGNOSIS — D631 Anemia in chronic kidney disease: Secondary | ICD-10-CM | POA: Diagnosis not present

## 2016-12-24 DIAGNOSIS — E1129 Type 2 diabetes mellitus with other diabetic kidney complication: Secondary | ICD-10-CM | POA: Diagnosis not present

## 2016-12-24 DIAGNOSIS — D631 Anemia in chronic kidney disease: Secondary | ICD-10-CM | POA: Diagnosis not present

## 2016-12-24 DIAGNOSIS — Z23 Encounter for immunization: Secondary | ICD-10-CM | POA: Diagnosis not present

## 2016-12-24 DIAGNOSIS — N186 End stage renal disease: Secondary | ICD-10-CM | POA: Diagnosis not present

## 2016-12-24 DIAGNOSIS — N2581 Secondary hyperparathyroidism of renal origin: Secondary | ICD-10-CM | POA: Diagnosis not present

## 2016-12-26 DIAGNOSIS — Z23 Encounter for immunization: Secondary | ICD-10-CM | POA: Diagnosis not present

## 2016-12-26 DIAGNOSIS — N2581 Secondary hyperparathyroidism of renal origin: Secondary | ICD-10-CM | POA: Diagnosis not present

## 2016-12-26 DIAGNOSIS — E1129 Type 2 diabetes mellitus with other diabetic kidney complication: Secondary | ICD-10-CM | POA: Diagnosis not present

## 2016-12-26 DIAGNOSIS — N186 End stage renal disease: Secondary | ICD-10-CM | POA: Diagnosis not present

## 2016-12-26 DIAGNOSIS — D631 Anemia in chronic kidney disease: Secondary | ICD-10-CM | POA: Diagnosis not present

## 2016-12-29 DIAGNOSIS — Z23 Encounter for immunization: Secondary | ICD-10-CM | POA: Diagnosis not present

## 2016-12-29 DIAGNOSIS — D631 Anemia in chronic kidney disease: Secondary | ICD-10-CM | POA: Diagnosis not present

## 2016-12-29 DIAGNOSIS — E1129 Type 2 diabetes mellitus with other diabetic kidney complication: Secondary | ICD-10-CM | POA: Diagnosis not present

## 2016-12-29 DIAGNOSIS — N2581 Secondary hyperparathyroidism of renal origin: Secondary | ICD-10-CM | POA: Diagnosis not present

## 2016-12-29 DIAGNOSIS — N186 End stage renal disease: Secondary | ICD-10-CM | POA: Diagnosis not present

## 2016-12-31 DIAGNOSIS — N2581 Secondary hyperparathyroidism of renal origin: Secondary | ICD-10-CM | POA: Diagnosis not present

## 2016-12-31 DIAGNOSIS — E1129 Type 2 diabetes mellitus with other diabetic kidney complication: Secondary | ICD-10-CM | POA: Diagnosis not present

## 2016-12-31 DIAGNOSIS — Z23 Encounter for immunization: Secondary | ICD-10-CM | POA: Diagnosis not present

## 2016-12-31 DIAGNOSIS — N186 End stage renal disease: Secondary | ICD-10-CM | POA: Diagnosis not present

## 2016-12-31 DIAGNOSIS — D631 Anemia in chronic kidney disease: Secondary | ICD-10-CM | POA: Diagnosis not present

## 2017-01-02 DIAGNOSIS — D631 Anemia in chronic kidney disease: Secondary | ICD-10-CM | POA: Diagnosis not present

## 2017-01-02 DIAGNOSIS — E1129 Type 2 diabetes mellitus with other diabetic kidney complication: Secondary | ICD-10-CM | POA: Diagnosis not present

## 2017-01-02 DIAGNOSIS — N186 End stage renal disease: Secondary | ICD-10-CM | POA: Diagnosis not present

## 2017-01-02 DIAGNOSIS — Z23 Encounter for immunization: Secondary | ICD-10-CM | POA: Diagnosis not present

## 2017-01-02 DIAGNOSIS — N2581 Secondary hyperparathyroidism of renal origin: Secondary | ICD-10-CM | POA: Diagnosis not present

## 2017-01-05 DIAGNOSIS — Z23 Encounter for immunization: Secondary | ICD-10-CM | POA: Diagnosis not present

## 2017-01-05 DIAGNOSIS — D631 Anemia in chronic kidney disease: Secondary | ICD-10-CM | POA: Diagnosis not present

## 2017-01-05 DIAGNOSIS — N186 End stage renal disease: Secondary | ICD-10-CM | POA: Diagnosis not present

## 2017-01-05 DIAGNOSIS — E1129 Type 2 diabetes mellitus with other diabetic kidney complication: Secondary | ICD-10-CM | POA: Diagnosis not present

## 2017-01-05 DIAGNOSIS — N2581 Secondary hyperparathyroidism of renal origin: Secondary | ICD-10-CM | POA: Diagnosis not present

## 2017-01-07 DIAGNOSIS — N2581 Secondary hyperparathyroidism of renal origin: Secondary | ICD-10-CM | POA: Diagnosis not present

## 2017-01-07 DIAGNOSIS — D631 Anemia in chronic kidney disease: Secondary | ICD-10-CM | POA: Diagnosis not present

## 2017-01-07 DIAGNOSIS — E1129 Type 2 diabetes mellitus with other diabetic kidney complication: Secondary | ICD-10-CM | POA: Diagnosis not present

## 2017-01-07 DIAGNOSIS — Z23 Encounter for immunization: Secondary | ICD-10-CM | POA: Diagnosis not present

## 2017-01-07 DIAGNOSIS — N186 End stage renal disease: Secondary | ICD-10-CM | POA: Diagnosis not present

## 2017-01-09 DIAGNOSIS — N2581 Secondary hyperparathyroidism of renal origin: Secondary | ICD-10-CM | POA: Diagnosis not present

## 2017-01-09 DIAGNOSIS — N186 End stage renal disease: Secondary | ICD-10-CM | POA: Diagnosis not present

## 2017-01-09 DIAGNOSIS — Z23 Encounter for immunization: Secondary | ICD-10-CM | POA: Diagnosis not present

## 2017-01-09 DIAGNOSIS — E1129 Type 2 diabetes mellitus with other diabetic kidney complication: Secondary | ICD-10-CM | POA: Diagnosis not present

## 2017-01-09 DIAGNOSIS — D631 Anemia in chronic kidney disease: Secondary | ICD-10-CM | POA: Diagnosis not present

## 2017-01-12 DIAGNOSIS — N2581 Secondary hyperparathyroidism of renal origin: Secondary | ICD-10-CM | POA: Diagnosis not present

## 2017-01-12 DIAGNOSIS — D631 Anemia in chronic kidney disease: Secondary | ICD-10-CM | POA: Diagnosis not present

## 2017-01-12 DIAGNOSIS — E1129 Type 2 diabetes mellitus with other diabetic kidney complication: Secondary | ICD-10-CM | POA: Diagnosis not present

## 2017-01-12 DIAGNOSIS — Z23 Encounter for immunization: Secondary | ICD-10-CM | POA: Diagnosis not present

## 2017-01-12 DIAGNOSIS — N186 End stage renal disease: Secondary | ICD-10-CM | POA: Diagnosis not present

## 2017-01-14 DIAGNOSIS — N2581 Secondary hyperparathyroidism of renal origin: Secondary | ICD-10-CM | POA: Diagnosis not present

## 2017-01-14 DIAGNOSIS — Z23 Encounter for immunization: Secondary | ICD-10-CM | POA: Diagnosis not present

## 2017-01-14 DIAGNOSIS — D631 Anemia in chronic kidney disease: Secondary | ICD-10-CM | POA: Diagnosis not present

## 2017-01-14 DIAGNOSIS — E1129 Type 2 diabetes mellitus with other diabetic kidney complication: Secondary | ICD-10-CM | POA: Diagnosis not present

## 2017-01-14 DIAGNOSIS — N186 End stage renal disease: Secondary | ICD-10-CM | POA: Diagnosis not present

## 2017-01-16 DIAGNOSIS — Z23 Encounter for immunization: Secondary | ICD-10-CM | POA: Diagnosis not present

## 2017-01-16 DIAGNOSIS — N186 End stage renal disease: Secondary | ICD-10-CM | POA: Diagnosis not present

## 2017-01-16 DIAGNOSIS — N2581 Secondary hyperparathyroidism of renal origin: Secondary | ICD-10-CM | POA: Diagnosis not present

## 2017-01-16 DIAGNOSIS — E1129 Type 2 diabetes mellitus with other diabetic kidney complication: Secondary | ICD-10-CM | POA: Diagnosis not present

## 2017-01-16 DIAGNOSIS — D631 Anemia in chronic kidney disease: Secondary | ICD-10-CM | POA: Diagnosis not present

## 2017-01-17 DIAGNOSIS — N186 End stage renal disease: Secondary | ICD-10-CM | POA: Diagnosis not present

## 2017-01-17 DIAGNOSIS — E1129 Type 2 diabetes mellitus with other diabetic kidney complication: Secondary | ICD-10-CM | POA: Diagnosis not present

## 2017-01-17 DIAGNOSIS — Z992 Dependence on renal dialysis: Secondary | ICD-10-CM | POA: Diagnosis not present

## 2017-01-19 DIAGNOSIS — N2581 Secondary hyperparathyroidism of renal origin: Secondary | ICD-10-CM | POA: Diagnosis not present

## 2017-01-19 DIAGNOSIS — N186 End stage renal disease: Secondary | ICD-10-CM | POA: Diagnosis not present

## 2017-01-19 DIAGNOSIS — D631 Anemia in chronic kidney disease: Secondary | ICD-10-CM | POA: Diagnosis not present

## 2017-01-19 DIAGNOSIS — E1129 Type 2 diabetes mellitus with other diabetic kidney complication: Secondary | ICD-10-CM | POA: Diagnosis not present

## 2017-01-21 DIAGNOSIS — N2581 Secondary hyperparathyroidism of renal origin: Secondary | ICD-10-CM | POA: Diagnosis not present

## 2017-01-21 DIAGNOSIS — E1129 Type 2 diabetes mellitus with other diabetic kidney complication: Secondary | ICD-10-CM | POA: Diagnosis not present

## 2017-01-21 DIAGNOSIS — D631 Anemia in chronic kidney disease: Secondary | ICD-10-CM | POA: Diagnosis not present

## 2017-01-21 DIAGNOSIS — N186 End stage renal disease: Secondary | ICD-10-CM | POA: Diagnosis not present

## 2017-01-23 DIAGNOSIS — N2581 Secondary hyperparathyroidism of renal origin: Secondary | ICD-10-CM | POA: Diagnosis not present

## 2017-01-23 DIAGNOSIS — E1129 Type 2 diabetes mellitus with other diabetic kidney complication: Secondary | ICD-10-CM | POA: Diagnosis not present

## 2017-01-23 DIAGNOSIS — D631 Anemia in chronic kidney disease: Secondary | ICD-10-CM | POA: Diagnosis not present

## 2017-01-23 DIAGNOSIS — N186 End stage renal disease: Secondary | ICD-10-CM | POA: Diagnosis not present

## 2017-01-26 DIAGNOSIS — E1129 Type 2 diabetes mellitus with other diabetic kidney complication: Secondary | ICD-10-CM | POA: Diagnosis not present

## 2017-01-26 DIAGNOSIS — N2581 Secondary hyperparathyroidism of renal origin: Secondary | ICD-10-CM | POA: Diagnosis not present

## 2017-01-26 DIAGNOSIS — D631 Anemia in chronic kidney disease: Secondary | ICD-10-CM | POA: Diagnosis not present

## 2017-01-26 DIAGNOSIS — N186 End stage renal disease: Secondary | ICD-10-CM | POA: Diagnosis not present

## 2017-01-28 DIAGNOSIS — D631 Anemia in chronic kidney disease: Secondary | ICD-10-CM | POA: Diagnosis not present

## 2017-01-28 DIAGNOSIS — E1129 Type 2 diabetes mellitus with other diabetic kidney complication: Secondary | ICD-10-CM | POA: Diagnosis not present

## 2017-01-28 DIAGNOSIS — N186 End stage renal disease: Secondary | ICD-10-CM | POA: Diagnosis not present

## 2017-01-28 DIAGNOSIS — N2581 Secondary hyperparathyroidism of renal origin: Secondary | ICD-10-CM | POA: Diagnosis not present

## 2017-01-30 DIAGNOSIS — E1129 Type 2 diabetes mellitus with other diabetic kidney complication: Secondary | ICD-10-CM | POA: Diagnosis not present

## 2017-01-30 DIAGNOSIS — N186 End stage renal disease: Secondary | ICD-10-CM | POA: Diagnosis not present

## 2017-01-30 DIAGNOSIS — N2581 Secondary hyperparathyroidism of renal origin: Secondary | ICD-10-CM | POA: Diagnosis not present

## 2017-01-30 DIAGNOSIS — D631 Anemia in chronic kidney disease: Secondary | ICD-10-CM | POA: Diagnosis not present

## 2017-02-02 DIAGNOSIS — D631 Anemia in chronic kidney disease: Secondary | ICD-10-CM | POA: Diagnosis not present

## 2017-02-02 DIAGNOSIS — N2581 Secondary hyperparathyroidism of renal origin: Secondary | ICD-10-CM | POA: Diagnosis not present

## 2017-02-02 DIAGNOSIS — N186 End stage renal disease: Secondary | ICD-10-CM | POA: Diagnosis not present

## 2017-02-02 DIAGNOSIS — E1129 Type 2 diabetes mellitus with other diabetic kidney complication: Secondary | ICD-10-CM | POA: Diagnosis not present

## 2017-02-04 DIAGNOSIS — D631 Anemia in chronic kidney disease: Secondary | ICD-10-CM | POA: Diagnosis not present

## 2017-02-04 DIAGNOSIS — E1129 Type 2 diabetes mellitus with other diabetic kidney complication: Secondary | ICD-10-CM | POA: Diagnosis not present

## 2017-02-04 DIAGNOSIS — N2581 Secondary hyperparathyroidism of renal origin: Secondary | ICD-10-CM | POA: Diagnosis not present

## 2017-02-04 DIAGNOSIS — N186 End stage renal disease: Secondary | ICD-10-CM | POA: Diagnosis not present

## 2017-02-06 DIAGNOSIS — N2581 Secondary hyperparathyroidism of renal origin: Secondary | ICD-10-CM | POA: Diagnosis not present

## 2017-02-06 DIAGNOSIS — N186 End stage renal disease: Secondary | ICD-10-CM | POA: Diagnosis not present

## 2017-02-06 DIAGNOSIS — E1129 Type 2 diabetes mellitus with other diabetic kidney complication: Secondary | ICD-10-CM | POA: Diagnosis not present

## 2017-02-06 DIAGNOSIS — D631 Anemia in chronic kidney disease: Secondary | ICD-10-CM | POA: Diagnosis not present

## 2017-02-09 DIAGNOSIS — E1129 Type 2 diabetes mellitus with other diabetic kidney complication: Secondary | ICD-10-CM | POA: Diagnosis not present

## 2017-02-09 DIAGNOSIS — D631 Anemia in chronic kidney disease: Secondary | ICD-10-CM | POA: Diagnosis not present

## 2017-02-09 DIAGNOSIS — N186 End stage renal disease: Secondary | ICD-10-CM | POA: Diagnosis not present

## 2017-02-09 DIAGNOSIS — N2581 Secondary hyperparathyroidism of renal origin: Secondary | ICD-10-CM | POA: Diagnosis not present

## 2017-02-11 DIAGNOSIS — D631 Anemia in chronic kidney disease: Secondary | ICD-10-CM | POA: Diagnosis not present

## 2017-02-11 DIAGNOSIS — E1129 Type 2 diabetes mellitus with other diabetic kidney complication: Secondary | ICD-10-CM | POA: Diagnosis not present

## 2017-02-11 DIAGNOSIS — N2581 Secondary hyperparathyroidism of renal origin: Secondary | ICD-10-CM | POA: Diagnosis not present

## 2017-02-11 DIAGNOSIS — N186 End stage renal disease: Secondary | ICD-10-CM | POA: Diagnosis not present

## 2017-02-13 DIAGNOSIS — N2581 Secondary hyperparathyroidism of renal origin: Secondary | ICD-10-CM | POA: Diagnosis not present

## 2017-02-13 DIAGNOSIS — E1129 Type 2 diabetes mellitus with other diabetic kidney complication: Secondary | ICD-10-CM | POA: Diagnosis not present

## 2017-02-13 DIAGNOSIS — N186 End stage renal disease: Secondary | ICD-10-CM | POA: Diagnosis not present

## 2017-02-13 DIAGNOSIS — D631 Anemia in chronic kidney disease: Secondary | ICD-10-CM | POA: Diagnosis not present

## 2017-02-16 DIAGNOSIS — N2581 Secondary hyperparathyroidism of renal origin: Secondary | ICD-10-CM | POA: Diagnosis not present

## 2017-02-16 DIAGNOSIS — D631 Anemia in chronic kidney disease: Secondary | ICD-10-CM | POA: Diagnosis not present

## 2017-02-16 DIAGNOSIS — N186 End stage renal disease: Secondary | ICD-10-CM | POA: Diagnosis not present

## 2017-02-16 DIAGNOSIS — E1129 Type 2 diabetes mellitus with other diabetic kidney complication: Secondary | ICD-10-CM | POA: Diagnosis not present

## 2017-02-16 DIAGNOSIS — Z992 Dependence on renal dialysis: Secondary | ICD-10-CM | POA: Diagnosis not present

## 2017-02-18 DIAGNOSIS — E1129 Type 2 diabetes mellitus with other diabetic kidney complication: Secondary | ICD-10-CM | POA: Diagnosis not present

## 2017-02-18 DIAGNOSIS — D631 Anemia in chronic kidney disease: Secondary | ICD-10-CM | POA: Diagnosis not present

## 2017-02-18 DIAGNOSIS — N2581 Secondary hyperparathyroidism of renal origin: Secondary | ICD-10-CM | POA: Diagnosis not present

## 2017-02-18 DIAGNOSIS — N186 End stage renal disease: Secondary | ICD-10-CM | POA: Diagnosis not present

## 2017-02-20 DIAGNOSIS — D631 Anemia in chronic kidney disease: Secondary | ICD-10-CM | POA: Diagnosis not present

## 2017-02-20 DIAGNOSIS — N186 End stage renal disease: Secondary | ICD-10-CM | POA: Diagnosis not present

## 2017-02-20 DIAGNOSIS — E1129 Type 2 diabetes mellitus with other diabetic kidney complication: Secondary | ICD-10-CM | POA: Diagnosis not present

## 2017-02-20 DIAGNOSIS — N2581 Secondary hyperparathyroidism of renal origin: Secondary | ICD-10-CM | POA: Diagnosis not present

## 2017-02-23 DIAGNOSIS — D631 Anemia in chronic kidney disease: Secondary | ICD-10-CM | POA: Diagnosis not present

## 2017-02-23 DIAGNOSIS — E1129 Type 2 diabetes mellitus with other diabetic kidney complication: Secondary | ICD-10-CM | POA: Diagnosis not present

## 2017-02-23 DIAGNOSIS — N2581 Secondary hyperparathyroidism of renal origin: Secondary | ICD-10-CM | POA: Diagnosis not present

## 2017-02-23 DIAGNOSIS — N186 End stage renal disease: Secondary | ICD-10-CM | POA: Diagnosis not present

## 2017-02-25 DIAGNOSIS — N2581 Secondary hyperparathyroidism of renal origin: Secondary | ICD-10-CM | POA: Diagnosis not present

## 2017-02-25 DIAGNOSIS — D631 Anemia in chronic kidney disease: Secondary | ICD-10-CM | POA: Diagnosis not present

## 2017-02-25 DIAGNOSIS — N186 End stage renal disease: Secondary | ICD-10-CM | POA: Diagnosis not present

## 2017-02-25 DIAGNOSIS — E1129 Type 2 diabetes mellitus with other diabetic kidney complication: Secondary | ICD-10-CM | POA: Diagnosis not present

## 2017-02-27 DIAGNOSIS — D631 Anemia in chronic kidney disease: Secondary | ICD-10-CM | POA: Diagnosis not present

## 2017-02-27 DIAGNOSIS — E1129 Type 2 diabetes mellitus with other diabetic kidney complication: Secondary | ICD-10-CM | POA: Diagnosis not present

## 2017-02-27 DIAGNOSIS — N2581 Secondary hyperparathyroidism of renal origin: Secondary | ICD-10-CM | POA: Diagnosis not present

## 2017-02-27 DIAGNOSIS — N186 End stage renal disease: Secondary | ICD-10-CM | POA: Diagnosis not present

## 2017-03-02 DIAGNOSIS — N2581 Secondary hyperparathyroidism of renal origin: Secondary | ICD-10-CM | POA: Diagnosis not present

## 2017-03-02 DIAGNOSIS — N186 End stage renal disease: Secondary | ICD-10-CM | POA: Diagnosis not present

## 2017-03-02 DIAGNOSIS — E1129 Type 2 diabetes mellitus with other diabetic kidney complication: Secondary | ICD-10-CM | POA: Diagnosis not present

## 2017-03-02 DIAGNOSIS — D631 Anemia in chronic kidney disease: Secondary | ICD-10-CM | POA: Diagnosis not present

## 2017-03-04 DIAGNOSIS — N186 End stage renal disease: Secondary | ICD-10-CM | POA: Diagnosis not present

## 2017-03-04 DIAGNOSIS — E1129 Type 2 diabetes mellitus with other diabetic kidney complication: Secondary | ICD-10-CM | POA: Diagnosis not present

## 2017-03-04 DIAGNOSIS — D631 Anemia in chronic kidney disease: Secondary | ICD-10-CM | POA: Diagnosis not present

## 2017-03-04 DIAGNOSIS — N2581 Secondary hyperparathyroidism of renal origin: Secondary | ICD-10-CM | POA: Diagnosis not present

## 2017-03-06 DIAGNOSIS — N2581 Secondary hyperparathyroidism of renal origin: Secondary | ICD-10-CM | POA: Diagnosis not present

## 2017-03-06 DIAGNOSIS — E1129 Type 2 diabetes mellitus with other diabetic kidney complication: Secondary | ICD-10-CM | POA: Diagnosis not present

## 2017-03-06 DIAGNOSIS — D631 Anemia in chronic kidney disease: Secondary | ICD-10-CM | POA: Diagnosis not present

## 2017-03-06 DIAGNOSIS — N186 End stage renal disease: Secondary | ICD-10-CM | POA: Diagnosis not present

## 2017-03-09 DIAGNOSIS — D631 Anemia in chronic kidney disease: Secondary | ICD-10-CM | POA: Diagnosis not present

## 2017-03-09 DIAGNOSIS — N186 End stage renal disease: Secondary | ICD-10-CM | POA: Diagnosis not present

## 2017-03-09 DIAGNOSIS — E1129 Type 2 diabetes mellitus with other diabetic kidney complication: Secondary | ICD-10-CM | POA: Diagnosis not present

## 2017-03-09 DIAGNOSIS — N2581 Secondary hyperparathyroidism of renal origin: Secondary | ICD-10-CM | POA: Diagnosis not present

## 2017-03-11 DIAGNOSIS — D631 Anemia in chronic kidney disease: Secondary | ICD-10-CM | POA: Diagnosis not present

## 2017-03-11 DIAGNOSIS — E1129 Type 2 diabetes mellitus with other diabetic kidney complication: Secondary | ICD-10-CM | POA: Diagnosis not present

## 2017-03-11 DIAGNOSIS — N2581 Secondary hyperparathyroidism of renal origin: Secondary | ICD-10-CM | POA: Diagnosis not present

## 2017-03-11 DIAGNOSIS — N186 End stage renal disease: Secondary | ICD-10-CM | POA: Diagnosis not present

## 2017-03-13 DIAGNOSIS — D631 Anemia in chronic kidney disease: Secondary | ICD-10-CM | POA: Diagnosis not present

## 2017-03-13 DIAGNOSIS — N186 End stage renal disease: Secondary | ICD-10-CM | POA: Diagnosis not present

## 2017-03-13 DIAGNOSIS — N2581 Secondary hyperparathyroidism of renal origin: Secondary | ICD-10-CM | POA: Diagnosis not present

## 2017-03-13 DIAGNOSIS — E1129 Type 2 diabetes mellitus with other diabetic kidney complication: Secondary | ICD-10-CM | POA: Diagnosis not present

## 2017-03-16 DIAGNOSIS — N186 End stage renal disease: Secondary | ICD-10-CM | POA: Diagnosis not present

## 2017-03-16 DIAGNOSIS — D631 Anemia in chronic kidney disease: Secondary | ICD-10-CM | POA: Diagnosis not present

## 2017-03-16 DIAGNOSIS — E1129 Type 2 diabetes mellitus with other diabetic kidney complication: Secondary | ICD-10-CM | POA: Diagnosis not present

## 2017-03-16 DIAGNOSIS — N2581 Secondary hyperparathyroidism of renal origin: Secondary | ICD-10-CM | POA: Diagnosis not present

## 2017-03-18 DIAGNOSIS — N2581 Secondary hyperparathyroidism of renal origin: Secondary | ICD-10-CM | POA: Diagnosis not present

## 2017-03-18 DIAGNOSIS — D631 Anemia in chronic kidney disease: Secondary | ICD-10-CM | POA: Diagnosis not present

## 2017-03-18 DIAGNOSIS — N186 End stage renal disease: Secondary | ICD-10-CM | POA: Diagnosis not present

## 2017-03-18 DIAGNOSIS — E1129 Type 2 diabetes mellitus with other diabetic kidney complication: Secondary | ICD-10-CM | POA: Diagnosis not present

## 2017-03-19 DIAGNOSIS — N186 End stage renal disease: Secondary | ICD-10-CM | POA: Diagnosis not present

## 2017-03-19 DIAGNOSIS — E1129 Type 2 diabetes mellitus with other diabetic kidney complication: Secondary | ICD-10-CM | POA: Diagnosis not present

## 2017-03-19 DIAGNOSIS — Z992 Dependence on renal dialysis: Secondary | ICD-10-CM | POA: Diagnosis not present

## 2017-03-20 DIAGNOSIS — E1129 Type 2 diabetes mellitus with other diabetic kidney complication: Secondary | ICD-10-CM | POA: Diagnosis not present

## 2017-03-20 DIAGNOSIS — N2581 Secondary hyperparathyroidism of renal origin: Secondary | ICD-10-CM | POA: Diagnosis not present

## 2017-03-20 DIAGNOSIS — N186 End stage renal disease: Secondary | ICD-10-CM | POA: Diagnosis not present

## 2017-03-20 DIAGNOSIS — D631 Anemia in chronic kidney disease: Secondary | ICD-10-CM | POA: Diagnosis not present

## 2017-03-23 DIAGNOSIS — E1129 Type 2 diabetes mellitus with other diabetic kidney complication: Secondary | ICD-10-CM | POA: Diagnosis not present

## 2017-03-23 DIAGNOSIS — N2581 Secondary hyperparathyroidism of renal origin: Secondary | ICD-10-CM | POA: Diagnosis not present

## 2017-03-23 DIAGNOSIS — D631 Anemia in chronic kidney disease: Secondary | ICD-10-CM | POA: Diagnosis not present

## 2017-03-23 DIAGNOSIS — N186 End stage renal disease: Secondary | ICD-10-CM | POA: Diagnosis not present

## 2017-03-25 DIAGNOSIS — E1129 Type 2 diabetes mellitus with other diabetic kidney complication: Secondary | ICD-10-CM | POA: Diagnosis not present

## 2017-03-25 DIAGNOSIS — D631 Anemia in chronic kidney disease: Secondary | ICD-10-CM | POA: Diagnosis not present

## 2017-03-25 DIAGNOSIS — N2581 Secondary hyperparathyroidism of renal origin: Secondary | ICD-10-CM | POA: Diagnosis not present

## 2017-03-25 DIAGNOSIS — N186 End stage renal disease: Secondary | ICD-10-CM | POA: Diagnosis not present

## 2017-03-27 DIAGNOSIS — E1129 Type 2 diabetes mellitus with other diabetic kidney complication: Secondary | ICD-10-CM | POA: Diagnosis not present

## 2017-03-27 DIAGNOSIS — D631 Anemia in chronic kidney disease: Secondary | ICD-10-CM | POA: Diagnosis not present

## 2017-03-27 DIAGNOSIS — N186 End stage renal disease: Secondary | ICD-10-CM | POA: Diagnosis not present

## 2017-03-27 DIAGNOSIS — N2581 Secondary hyperparathyroidism of renal origin: Secondary | ICD-10-CM | POA: Diagnosis not present

## 2017-03-30 DIAGNOSIS — N2581 Secondary hyperparathyroidism of renal origin: Secondary | ICD-10-CM | POA: Diagnosis not present

## 2017-03-30 DIAGNOSIS — E1129 Type 2 diabetes mellitus with other diabetic kidney complication: Secondary | ICD-10-CM | POA: Diagnosis not present

## 2017-03-30 DIAGNOSIS — N186 End stage renal disease: Secondary | ICD-10-CM | POA: Diagnosis not present

## 2017-03-30 DIAGNOSIS — D631 Anemia in chronic kidney disease: Secondary | ICD-10-CM | POA: Diagnosis not present

## 2017-04-01 DIAGNOSIS — N2581 Secondary hyperparathyroidism of renal origin: Secondary | ICD-10-CM | POA: Diagnosis not present

## 2017-04-01 DIAGNOSIS — N186 End stage renal disease: Secondary | ICD-10-CM | POA: Diagnosis not present

## 2017-04-01 DIAGNOSIS — E1129 Type 2 diabetes mellitus with other diabetic kidney complication: Secondary | ICD-10-CM | POA: Diagnosis not present

## 2017-04-01 DIAGNOSIS — D631 Anemia in chronic kidney disease: Secondary | ICD-10-CM | POA: Diagnosis not present

## 2017-04-03 DIAGNOSIS — E1129 Type 2 diabetes mellitus with other diabetic kidney complication: Secondary | ICD-10-CM | POA: Diagnosis not present

## 2017-04-03 DIAGNOSIS — N2581 Secondary hyperparathyroidism of renal origin: Secondary | ICD-10-CM | POA: Diagnosis not present

## 2017-04-03 DIAGNOSIS — N186 End stage renal disease: Secondary | ICD-10-CM | POA: Diagnosis not present

## 2017-04-03 DIAGNOSIS — D631 Anemia in chronic kidney disease: Secondary | ICD-10-CM | POA: Diagnosis not present

## 2017-04-06 DIAGNOSIS — N186 End stage renal disease: Secondary | ICD-10-CM | POA: Diagnosis not present

## 2017-04-06 DIAGNOSIS — D631 Anemia in chronic kidney disease: Secondary | ICD-10-CM | POA: Diagnosis not present

## 2017-04-06 DIAGNOSIS — N2581 Secondary hyperparathyroidism of renal origin: Secondary | ICD-10-CM | POA: Diagnosis not present

## 2017-04-06 DIAGNOSIS — E1129 Type 2 diabetes mellitus with other diabetic kidney complication: Secondary | ICD-10-CM | POA: Diagnosis not present

## 2017-04-08 DIAGNOSIS — N2581 Secondary hyperparathyroidism of renal origin: Secondary | ICD-10-CM | POA: Diagnosis not present

## 2017-04-08 DIAGNOSIS — E1129 Type 2 diabetes mellitus with other diabetic kidney complication: Secondary | ICD-10-CM | POA: Diagnosis not present

## 2017-04-08 DIAGNOSIS — N186 End stage renal disease: Secondary | ICD-10-CM | POA: Diagnosis not present

## 2017-04-08 DIAGNOSIS — D631 Anemia in chronic kidney disease: Secondary | ICD-10-CM | POA: Diagnosis not present

## 2017-04-10 DIAGNOSIS — E1129 Type 2 diabetes mellitus with other diabetic kidney complication: Secondary | ICD-10-CM | POA: Diagnosis not present

## 2017-04-10 DIAGNOSIS — N186 End stage renal disease: Secondary | ICD-10-CM | POA: Diagnosis not present

## 2017-04-10 DIAGNOSIS — N2581 Secondary hyperparathyroidism of renal origin: Secondary | ICD-10-CM | POA: Diagnosis not present

## 2017-04-10 DIAGNOSIS — D631 Anemia in chronic kidney disease: Secondary | ICD-10-CM | POA: Diagnosis not present

## 2017-04-13 DIAGNOSIS — N2581 Secondary hyperparathyroidism of renal origin: Secondary | ICD-10-CM | POA: Diagnosis not present

## 2017-04-13 DIAGNOSIS — E1129 Type 2 diabetes mellitus with other diabetic kidney complication: Secondary | ICD-10-CM | POA: Diagnosis not present

## 2017-04-13 DIAGNOSIS — D631 Anemia in chronic kidney disease: Secondary | ICD-10-CM | POA: Diagnosis not present

## 2017-04-13 DIAGNOSIS — N186 End stage renal disease: Secondary | ICD-10-CM | POA: Diagnosis not present

## 2017-04-15 DIAGNOSIS — D631 Anemia in chronic kidney disease: Secondary | ICD-10-CM | POA: Diagnosis not present

## 2017-04-15 DIAGNOSIS — N186 End stage renal disease: Secondary | ICD-10-CM | POA: Diagnosis not present

## 2017-04-15 DIAGNOSIS — N2581 Secondary hyperparathyroidism of renal origin: Secondary | ICD-10-CM | POA: Diagnosis not present

## 2017-04-15 DIAGNOSIS — E1129 Type 2 diabetes mellitus with other diabetic kidney complication: Secondary | ICD-10-CM | POA: Diagnosis not present

## 2017-04-17 DIAGNOSIS — E1129 Type 2 diabetes mellitus with other diabetic kidney complication: Secondary | ICD-10-CM | POA: Diagnosis not present

## 2017-04-17 DIAGNOSIS — D631 Anemia in chronic kidney disease: Secondary | ICD-10-CM | POA: Diagnosis not present

## 2017-04-17 DIAGNOSIS — N2581 Secondary hyperparathyroidism of renal origin: Secondary | ICD-10-CM | POA: Diagnosis not present

## 2017-04-17 DIAGNOSIS — N186 End stage renal disease: Secondary | ICD-10-CM | POA: Diagnosis not present

## 2017-04-18 DIAGNOSIS — Z992 Dependence on renal dialysis: Secondary | ICD-10-CM | POA: Diagnosis not present

## 2017-04-18 DIAGNOSIS — N186 End stage renal disease: Secondary | ICD-10-CM | POA: Diagnosis not present

## 2017-04-18 DIAGNOSIS — E1129 Type 2 diabetes mellitus with other diabetic kidney complication: Secondary | ICD-10-CM | POA: Diagnosis not present

## 2017-04-20 DIAGNOSIS — E1129 Type 2 diabetes mellitus with other diabetic kidney complication: Secondary | ICD-10-CM | POA: Diagnosis not present

## 2017-04-20 DIAGNOSIS — D631 Anemia in chronic kidney disease: Secondary | ICD-10-CM | POA: Diagnosis not present

## 2017-04-20 DIAGNOSIS — N186 End stage renal disease: Secondary | ICD-10-CM | POA: Diagnosis not present

## 2017-04-20 DIAGNOSIS — N2581 Secondary hyperparathyroidism of renal origin: Secondary | ICD-10-CM | POA: Diagnosis not present

## 2017-04-22 DIAGNOSIS — N186 End stage renal disease: Secondary | ICD-10-CM | POA: Diagnosis not present

## 2017-04-22 DIAGNOSIS — N2581 Secondary hyperparathyroidism of renal origin: Secondary | ICD-10-CM | POA: Diagnosis not present

## 2017-04-22 DIAGNOSIS — D631 Anemia in chronic kidney disease: Secondary | ICD-10-CM | POA: Diagnosis not present

## 2017-04-22 DIAGNOSIS — E1129 Type 2 diabetes mellitus with other diabetic kidney complication: Secondary | ICD-10-CM | POA: Diagnosis not present

## 2017-04-24 DIAGNOSIS — N186 End stage renal disease: Secondary | ICD-10-CM | POA: Diagnosis not present

## 2017-04-24 DIAGNOSIS — N2581 Secondary hyperparathyroidism of renal origin: Secondary | ICD-10-CM | POA: Diagnosis not present

## 2017-04-24 DIAGNOSIS — D631 Anemia in chronic kidney disease: Secondary | ICD-10-CM | POA: Diagnosis not present

## 2017-04-24 DIAGNOSIS — E1129 Type 2 diabetes mellitus with other diabetic kidney complication: Secondary | ICD-10-CM | POA: Diagnosis not present

## 2017-04-27 DIAGNOSIS — N2581 Secondary hyperparathyroidism of renal origin: Secondary | ICD-10-CM | POA: Diagnosis not present

## 2017-04-27 DIAGNOSIS — N186 End stage renal disease: Secondary | ICD-10-CM | POA: Diagnosis not present

## 2017-04-27 DIAGNOSIS — E1129 Type 2 diabetes mellitus with other diabetic kidney complication: Secondary | ICD-10-CM | POA: Diagnosis not present

## 2017-04-27 DIAGNOSIS — D631 Anemia in chronic kidney disease: Secondary | ICD-10-CM | POA: Diagnosis not present

## 2017-04-29 DIAGNOSIS — N186 End stage renal disease: Secondary | ICD-10-CM | POA: Diagnosis not present

## 2017-04-29 DIAGNOSIS — N2581 Secondary hyperparathyroidism of renal origin: Secondary | ICD-10-CM | POA: Diagnosis not present

## 2017-04-29 DIAGNOSIS — D631 Anemia in chronic kidney disease: Secondary | ICD-10-CM | POA: Diagnosis not present

## 2017-04-29 DIAGNOSIS — E1129 Type 2 diabetes mellitus with other diabetic kidney complication: Secondary | ICD-10-CM | POA: Diagnosis not present

## 2017-05-01 DIAGNOSIS — N2581 Secondary hyperparathyroidism of renal origin: Secondary | ICD-10-CM | POA: Diagnosis not present

## 2017-05-01 DIAGNOSIS — D631 Anemia in chronic kidney disease: Secondary | ICD-10-CM | POA: Diagnosis not present

## 2017-05-01 DIAGNOSIS — N186 End stage renal disease: Secondary | ICD-10-CM | POA: Diagnosis not present

## 2017-05-01 DIAGNOSIS — E1129 Type 2 diabetes mellitus with other diabetic kidney complication: Secondary | ICD-10-CM | POA: Diagnosis not present

## 2017-05-04 DIAGNOSIS — N2581 Secondary hyperparathyroidism of renal origin: Secondary | ICD-10-CM | POA: Diagnosis not present

## 2017-05-04 DIAGNOSIS — D631 Anemia in chronic kidney disease: Secondary | ICD-10-CM | POA: Diagnosis not present

## 2017-05-04 DIAGNOSIS — E1129 Type 2 diabetes mellitus with other diabetic kidney complication: Secondary | ICD-10-CM | POA: Diagnosis not present

## 2017-05-04 DIAGNOSIS — N186 End stage renal disease: Secondary | ICD-10-CM | POA: Diagnosis not present

## 2017-05-06 DIAGNOSIS — N186 End stage renal disease: Secondary | ICD-10-CM | POA: Diagnosis not present

## 2017-05-06 DIAGNOSIS — N2581 Secondary hyperparathyroidism of renal origin: Secondary | ICD-10-CM | POA: Diagnosis not present

## 2017-05-06 DIAGNOSIS — D631 Anemia in chronic kidney disease: Secondary | ICD-10-CM | POA: Diagnosis not present

## 2017-05-06 DIAGNOSIS — E1129 Type 2 diabetes mellitus with other diabetic kidney complication: Secondary | ICD-10-CM | POA: Diagnosis not present

## 2017-05-08 DIAGNOSIS — D631 Anemia in chronic kidney disease: Secondary | ICD-10-CM | POA: Diagnosis not present

## 2017-05-08 DIAGNOSIS — N186 End stage renal disease: Secondary | ICD-10-CM | POA: Diagnosis not present

## 2017-05-08 DIAGNOSIS — E1129 Type 2 diabetes mellitus with other diabetic kidney complication: Secondary | ICD-10-CM | POA: Diagnosis not present

## 2017-05-08 DIAGNOSIS — N2581 Secondary hyperparathyroidism of renal origin: Secondary | ICD-10-CM | POA: Diagnosis not present

## 2017-05-11 DIAGNOSIS — N186 End stage renal disease: Secondary | ICD-10-CM | POA: Diagnosis not present

## 2017-05-11 DIAGNOSIS — N2581 Secondary hyperparathyroidism of renal origin: Secondary | ICD-10-CM | POA: Diagnosis not present

## 2017-05-11 DIAGNOSIS — D631 Anemia in chronic kidney disease: Secondary | ICD-10-CM | POA: Diagnosis not present

## 2017-05-11 DIAGNOSIS — E1129 Type 2 diabetes mellitus with other diabetic kidney complication: Secondary | ICD-10-CM | POA: Diagnosis not present

## 2017-05-13 DIAGNOSIS — N2581 Secondary hyperparathyroidism of renal origin: Secondary | ICD-10-CM | POA: Diagnosis not present

## 2017-05-13 DIAGNOSIS — D631 Anemia in chronic kidney disease: Secondary | ICD-10-CM | POA: Diagnosis not present

## 2017-05-13 DIAGNOSIS — N186 End stage renal disease: Secondary | ICD-10-CM | POA: Diagnosis not present

## 2017-05-13 DIAGNOSIS — E1129 Type 2 diabetes mellitus with other diabetic kidney complication: Secondary | ICD-10-CM | POA: Diagnosis not present

## 2017-05-15 DIAGNOSIS — N2581 Secondary hyperparathyroidism of renal origin: Secondary | ICD-10-CM | POA: Diagnosis not present

## 2017-05-15 DIAGNOSIS — D631 Anemia in chronic kidney disease: Secondary | ICD-10-CM | POA: Diagnosis not present

## 2017-05-15 DIAGNOSIS — E1129 Type 2 diabetes mellitus with other diabetic kidney complication: Secondary | ICD-10-CM | POA: Diagnosis not present

## 2017-05-15 DIAGNOSIS — N186 End stage renal disease: Secondary | ICD-10-CM | POA: Diagnosis not present

## 2017-05-18 DIAGNOSIS — D631 Anemia in chronic kidney disease: Secondary | ICD-10-CM | POA: Diagnosis not present

## 2017-05-18 DIAGNOSIS — E1129 Type 2 diabetes mellitus with other diabetic kidney complication: Secondary | ICD-10-CM | POA: Diagnosis not present

## 2017-05-18 DIAGNOSIS — N186 End stage renal disease: Secondary | ICD-10-CM | POA: Diagnosis not present

## 2017-05-18 DIAGNOSIS — N2581 Secondary hyperparathyroidism of renal origin: Secondary | ICD-10-CM | POA: Diagnosis not present

## 2017-05-19 DIAGNOSIS — Z992 Dependence on renal dialysis: Secondary | ICD-10-CM | POA: Diagnosis not present

## 2017-05-19 DIAGNOSIS — E1129 Type 2 diabetes mellitus with other diabetic kidney complication: Secondary | ICD-10-CM | POA: Diagnosis not present

## 2017-05-19 DIAGNOSIS — N186 End stage renal disease: Secondary | ICD-10-CM | POA: Diagnosis not present

## 2017-05-20 DIAGNOSIS — E1129 Type 2 diabetes mellitus with other diabetic kidney complication: Secondary | ICD-10-CM | POA: Diagnosis not present

## 2017-05-20 DIAGNOSIS — N186 End stage renal disease: Secondary | ICD-10-CM | POA: Diagnosis not present

## 2017-05-20 DIAGNOSIS — D631 Anemia in chronic kidney disease: Secondary | ICD-10-CM | POA: Diagnosis not present

## 2017-05-20 DIAGNOSIS — N2581 Secondary hyperparathyroidism of renal origin: Secondary | ICD-10-CM | POA: Diagnosis not present

## 2017-05-22 DIAGNOSIS — N186 End stage renal disease: Secondary | ICD-10-CM | POA: Diagnosis not present

## 2017-05-22 DIAGNOSIS — D631 Anemia in chronic kidney disease: Secondary | ICD-10-CM | POA: Diagnosis not present

## 2017-05-22 DIAGNOSIS — E1129 Type 2 diabetes mellitus with other diabetic kidney complication: Secondary | ICD-10-CM | POA: Diagnosis not present

## 2017-05-22 DIAGNOSIS — N2581 Secondary hyperparathyroidism of renal origin: Secondary | ICD-10-CM | POA: Diagnosis not present

## 2017-05-25 DIAGNOSIS — D631 Anemia in chronic kidney disease: Secondary | ICD-10-CM | POA: Diagnosis not present

## 2017-05-25 DIAGNOSIS — N186 End stage renal disease: Secondary | ICD-10-CM | POA: Diagnosis not present

## 2017-05-25 DIAGNOSIS — N2581 Secondary hyperparathyroidism of renal origin: Secondary | ICD-10-CM | POA: Diagnosis not present

## 2017-05-25 DIAGNOSIS — E1129 Type 2 diabetes mellitus with other diabetic kidney complication: Secondary | ICD-10-CM | POA: Diagnosis not present

## 2017-05-27 DIAGNOSIS — E1129 Type 2 diabetes mellitus with other diabetic kidney complication: Secondary | ICD-10-CM | POA: Diagnosis not present

## 2017-05-27 DIAGNOSIS — N186 End stage renal disease: Secondary | ICD-10-CM | POA: Diagnosis not present

## 2017-05-27 DIAGNOSIS — D631 Anemia in chronic kidney disease: Secondary | ICD-10-CM | POA: Diagnosis not present

## 2017-05-27 DIAGNOSIS — N2581 Secondary hyperparathyroidism of renal origin: Secondary | ICD-10-CM | POA: Diagnosis not present

## 2017-05-29 DIAGNOSIS — N2581 Secondary hyperparathyroidism of renal origin: Secondary | ICD-10-CM | POA: Diagnosis not present

## 2017-05-29 DIAGNOSIS — N186 End stage renal disease: Secondary | ICD-10-CM | POA: Diagnosis not present

## 2017-05-29 DIAGNOSIS — D631 Anemia in chronic kidney disease: Secondary | ICD-10-CM | POA: Diagnosis not present

## 2017-05-29 DIAGNOSIS — E1129 Type 2 diabetes mellitus with other diabetic kidney complication: Secondary | ICD-10-CM | POA: Diagnosis not present

## 2017-06-01 DIAGNOSIS — N2581 Secondary hyperparathyroidism of renal origin: Secondary | ICD-10-CM | POA: Diagnosis not present

## 2017-06-01 DIAGNOSIS — D631 Anemia in chronic kidney disease: Secondary | ICD-10-CM | POA: Diagnosis not present

## 2017-06-01 DIAGNOSIS — E1129 Type 2 diabetes mellitus with other diabetic kidney complication: Secondary | ICD-10-CM | POA: Diagnosis not present

## 2017-06-01 DIAGNOSIS — N186 End stage renal disease: Secondary | ICD-10-CM | POA: Diagnosis not present

## 2017-06-03 DIAGNOSIS — E1129 Type 2 diabetes mellitus with other diabetic kidney complication: Secondary | ICD-10-CM | POA: Diagnosis not present

## 2017-06-03 DIAGNOSIS — D631 Anemia in chronic kidney disease: Secondary | ICD-10-CM | POA: Diagnosis not present

## 2017-06-03 DIAGNOSIS — N2581 Secondary hyperparathyroidism of renal origin: Secondary | ICD-10-CM | POA: Diagnosis not present

## 2017-06-03 DIAGNOSIS — N186 End stage renal disease: Secondary | ICD-10-CM | POA: Diagnosis not present

## 2017-06-05 DIAGNOSIS — D631 Anemia in chronic kidney disease: Secondary | ICD-10-CM | POA: Diagnosis not present

## 2017-06-05 DIAGNOSIS — N2581 Secondary hyperparathyroidism of renal origin: Secondary | ICD-10-CM | POA: Diagnosis not present

## 2017-06-05 DIAGNOSIS — E1129 Type 2 diabetes mellitus with other diabetic kidney complication: Secondary | ICD-10-CM | POA: Diagnosis not present

## 2017-06-05 DIAGNOSIS — N186 End stage renal disease: Secondary | ICD-10-CM | POA: Diagnosis not present

## 2017-06-08 DIAGNOSIS — D631 Anemia in chronic kidney disease: Secondary | ICD-10-CM | POA: Diagnosis not present

## 2017-06-08 DIAGNOSIS — N186 End stage renal disease: Secondary | ICD-10-CM | POA: Diagnosis not present

## 2017-06-08 DIAGNOSIS — N2581 Secondary hyperparathyroidism of renal origin: Secondary | ICD-10-CM | POA: Diagnosis not present

## 2017-06-08 DIAGNOSIS — E1129 Type 2 diabetes mellitus with other diabetic kidney complication: Secondary | ICD-10-CM | POA: Diagnosis not present

## 2017-06-10 DIAGNOSIS — E1129 Type 2 diabetes mellitus with other diabetic kidney complication: Secondary | ICD-10-CM | POA: Diagnosis not present

## 2017-06-10 DIAGNOSIS — N186 End stage renal disease: Secondary | ICD-10-CM | POA: Diagnosis not present

## 2017-06-10 DIAGNOSIS — N2581 Secondary hyperparathyroidism of renal origin: Secondary | ICD-10-CM | POA: Diagnosis not present

## 2017-06-10 DIAGNOSIS — D631 Anemia in chronic kidney disease: Secondary | ICD-10-CM | POA: Diagnosis not present

## 2017-06-12 DIAGNOSIS — E1129 Type 2 diabetes mellitus with other diabetic kidney complication: Secondary | ICD-10-CM | POA: Diagnosis not present

## 2017-06-12 DIAGNOSIS — N186 End stage renal disease: Secondary | ICD-10-CM | POA: Diagnosis not present

## 2017-06-12 DIAGNOSIS — N2581 Secondary hyperparathyroidism of renal origin: Secondary | ICD-10-CM | POA: Diagnosis not present

## 2017-06-12 DIAGNOSIS — D631 Anemia in chronic kidney disease: Secondary | ICD-10-CM | POA: Diagnosis not present

## 2017-06-15 DIAGNOSIS — N2581 Secondary hyperparathyroidism of renal origin: Secondary | ICD-10-CM | POA: Diagnosis not present

## 2017-06-15 DIAGNOSIS — N186 End stage renal disease: Secondary | ICD-10-CM | POA: Diagnosis not present

## 2017-06-15 DIAGNOSIS — D631 Anemia in chronic kidney disease: Secondary | ICD-10-CM | POA: Diagnosis not present

## 2017-06-15 DIAGNOSIS — E1129 Type 2 diabetes mellitus with other diabetic kidney complication: Secondary | ICD-10-CM | POA: Diagnosis not present

## 2017-06-17 DIAGNOSIS — N2581 Secondary hyperparathyroidism of renal origin: Secondary | ICD-10-CM | POA: Diagnosis not present

## 2017-06-17 DIAGNOSIS — D631 Anemia in chronic kidney disease: Secondary | ICD-10-CM | POA: Diagnosis not present

## 2017-06-17 DIAGNOSIS — N186 End stage renal disease: Secondary | ICD-10-CM | POA: Diagnosis not present

## 2017-06-17 DIAGNOSIS — E1129 Type 2 diabetes mellitus with other diabetic kidney complication: Secondary | ICD-10-CM | POA: Diagnosis not present

## 2017-06-19 DIAGNOSIS — N186 End stage renal disease: Secondary | ICD-10-CM | POA: Diagnosis not present

## 2017-06-19 DIAGNOSIS — D631 Anemia in chronic kidney disease: Secondary | ICD-10-CM | POA: Diagnosis not present

## 2017-06-19 DIAGNOSIS — Z992 Dependence on renal dialysis: Secondary | ICD-10-CM | POA: Diagnosis not present

## 2017-06-19 DIAGNOSIS — N2581 Secondary hyperparathyroidism of renal origin: Secondary | ICD-10-CM | POA: Diagnosis not present

## 2017-06-19 DIAGNOSIS — E1129 Type 2 diabetes mellitus with other diabetic kidney complication: Secondary | ICD-10-CM | POA: Diagnosis not present

## 2017-06-22 DIAGNOSIS — D631 Anemia in chronic kidney disease: Secondary | ICD-10-CM | POA: Diagnosis not present

## 2017-06-22 DIAGNOSIS — E1129 Type 2 diabetes mellitus with other diabetic kidney complication: Secondary | ICD-10-CM | POA: Diagnosis not present

## 2017-06-22 DIAGNOSIS — N2581 Secondary hyperparathyroidism of renal origin: Secondary | ICD-10-CM | POA: Diagnosis not present

## 2017-06-22 DIAGNOSIS — N186 End stage renal disease: Secondary | ICD-10-CM | POA: Diagnosis not present

## 2017-06-24 DIAGNOSIS — D631 Anemia in chronic kidney disease: Secondary | ICD-10-CM | POA: Diagnosis not present

## 2017-06-24 DIAGNOSIS — N2581 Secondary hyperparathyroidism of renal origin: Secondary | ICD-10-CM | POA: Diagnosis not present

## 2017-06-24 DIAGNOSIS — N186 End stage renal disease: Secondary | ICD-10-CM | POA: Diagnosis not present

## 2017-06-24 DIAGNOSIS — E1129 Type 2 diabetes mellitus with other diabetic kidney complication: Secondary | ICD-10-CM | POA: Diagnosis not present

## 2017-06-25 DIAGNOSIS — N2581 Secondary hyperparathyroidism of renal origin: Secondary | ICD-10-CM | POA: Diagnosis not present

## 2017-06-25 DIAGNOSIS — D631 Anemia in chronic kidney disease: Secondary | ICD-10-CM | POA: Diagnosis not present

## 2017-06-25 DIAGNOSIS — E1129 Type 2 diabetes mellitus with other diabetic kidney complication: Secondary | ICD-10-CM | POA: Diagnosis not present

## 2017-06-25 DIAGNOSIS — N186 End stage renal disease: Secondary | ICD-10-CM | POA: Diagnosis not present

## 2017-06-29 DIAGNOSIS — N2581 Secondary hyperparathyroidism of renal origin: Secondary | ICD-10-CM | POA: Diagnosis not present

## 2017-06-29 DIAGNOSIS — N186 End stage renal disease: Secondary | ICD-10-CM | POA: Diagnosis not present

## 2017-07-01 DIAGNOSIS — N2581 Secondary hyperparathyroidism of renal origin: Secondary | ICD-10-CM | POA: Diagnosis not present

## 2017-07-01 DIAGNOSIS — N186 End stage renal disease: Secondary | ICD-10-CM | POA: Diagnosis not present

## 2017-07-01 DIAGNOSIS — D631 Anemia in chronic kidney disease: Secondary | ICD-10-CM | POA: Diagnosis not present

## 2017-07-01 DIAGNOSIS — E1129 Type 2 diabetes mellitus with other diabetic kidney complication: Secondary | ICD-10-CM | POA: Diagnosis not present

## 2017-07-03 DIAGNOSIS — N186 End stage renal disease: Secondary | ICD-10-CM | POA: Diagnosis not present

## 2017-07-03 DIAGNOSIS — D631 Anemia in chronic kidney disease: Secondary | ICD-10-CM | POA: Diagnosis not present

## 2017-07-03 DIAGNOSIS — E1129 Type 2 diabetes mellitus with other diabetic kidney complication: Secondary | ICD-10-CM | POA: Diagnosis not present

## 2017-07-03 DIAGNOSIS — N2581 Secondary hyperparathyroidism of renal origin: Secondary | ICD-10-CM | POA: Diagnosis not present

## 2017-07-06 DIAGNOSIS — N2581 Secondary hyperparathyroidism of renal origin: Secondary | ICD-10-CM | POA: Diagnosis not present

## 2017-07-06 DIAGNOSIS — E1129 Type 2 diabetes mellitus with other diabetic kidney complication: Secondary | ICD-10-CM | POA: Diagnosis not present

## 2017-07-06 DIAGNOSIS — N186 End stage renal disease: Secondary | ICD-10-CM | POA: Diagnosis not present

## 2017-07-06 DIAGNOSIS — D631 Anemia in chronic kidney disease: Secondary | ICD-10-CM | POA: Diagnosis not present

## 2017-07-08 DIAGNOSIS — E1129 Type 2 diabetes mellitus with other diabetic kidney complication: Secondary | ICD-10-CM | POA: Diagnosis not present

## 2017-07-08 DIAGNOSIS — N186 End stage renal disease: Secondary | ICD-10-CM | POA: Diagnosis not present

## 2017-07-08 DIAGNOSIS — D631 Anemia in chronic kidney disease: Secondary | ICD-10-CM | POA: Diagnosis not present

## 2017-07-08 DIAGNOSIS — N2581 Secondary hyperparathyroidism of renal origin: Secondary | ICD-10-CM | POA: Diagnosis not present

## 2017-07-10 DIAGNOSIS — D631 Anemia in chronic kidney disease: Secondary | ICD-10-CM | POA: Diagnosis not present

## 2017-07-10 DIAGNOSIS — N2581 Secondary hyperparathyroidism of renal origin: Secondary | ICD-10-CM | POA: Diagnosis not present

## 2017-07-10 DIAGNOSIS — E1129 Type 2 diabetes mellitus with other diabetic kidney complication: Secondary | ICD-10-CM | POA: Diagnosis not present

## 2017-07-10 DIAGNOSIS — N186 End stage renal disease: Secondary | ICD-10-CM | POA: Diagnosis not present

## 2017-07-13 DIAGNOSIS — N2581 Secondary hyperparathyroidism of renal origin: Secondary | ICD-10-CM | POA: Diagnosis not present

## 2017-07-13 DIAGNOSIS — E1129 Type 2 diabetes mellitus with other diabetic kidney complication: Secondary | ICD-10-CM | POA: Diagnosis not present

## 2017-07-13 DIAGNOSIS — D631 Anemia in chronic kidney disease: Secondary | ICD-10-CM | POA: Diagnosis not present

## 2017-07-13 DIAGNOSIS — N186 End stage renal disease: Secondary | ICD-10-CM | POA: Diagnosis not present

## 2017-07-15 DIAGNOSIS — N186 End stage renal disease: Secondary | ICD-10-CM | POA: Diagnosis not present

## 2017-07-15 DIAGNOSIS — E1129 Type 2 diabetes mellitus with other diabetic kidney complication: Secondary | ICD-10-CM | POA: Diagnosis not present

## 2017-07-15 DIAGNOSIS — N2581 Secondary hyperparathyroidism of renal origin: Secondary | ICD-10-CM | POA: Diagnosis not present

## 2017-07-15 DIAGNOSIS — D631 Anemia in chronic kidney disease: Secondary | ICD-10-CM | POA: Diagnosis not present

## 2017-07-17 DIAGNOSIS — N186 End stage renal disease: Secondary | ICD-10-CM | POA: Diagnosis not present

## 2017-07-17 DIAGNOSIS — D631 Anemia in chronic kidney disease: Secondary | ICD-10-CM | POA: Diagnosis not present

## 2017-07-17 DIAGNOSIS — N2581 Secondary hyperparathyroidism of renal origin: Secondary | ICD-10-CM | POA: Diagnosis not present

## 2017-07-17 DIAGNOSIS — E1129 Type 2 diabetes mellitus with other diabetic kidney complication: Secondary | ICD-10-CM | POA: Diagnosis not present

## 2017-07-19 DIAGNOSIS — Z992 Dependence on renal dialysis: Secondary | ICD-10-CM | POA: Diagnosis not present

## 2017-07-19 DIAGNOSIS — E1129 Type 2 diabetes mellitus with other diabetic kidney complication: Secondary | ICD-10-CM | POA: Diagnosis not present

## 2017-07-19 DIAGNOSIS — N186 End stage renal disease: Secondary | ICD-10-CM | POA: Diagnosis not present

## 2017-07-20 DIAGNOSIS — N2581 Secondary hyperparathyroidism of renal origin: Secondary | ICD-10-CM | POA: Diagnosis not present

## 2017-07-20 DIAGNOSIS — Z992 Dependence on renal dialysis: Secondary | ICD-10-CM | POA: Diagnosis not present

## 2017-07-20 DIAGNOSIS — N186 End stage renal disease: Secondary | ICD-10-CM | POA: Diagnosis not present

## 2017-07-20 DIAGNOSIS — D631 Anemia in chronic kidney disease: Secondary | ICD-10-CM | POA: Diagnosis not present

## 2017-07-20 DIAGNOSIS — E1129 Type 2 diabetes mellitus with other diabetic kidney complication: Secondary | ICD-10-CM | POA: Diagnosis not present

## 2017-07-22 DIAGNOSIS — D631 Anemia in chronic kidney disease: Secondary | ICD-10-CM | POA: Diagnosis not present

## 2017-07-22 DIAGNOSIS — E1129 Type 2 diabetes mellitus with other diabetic kidney complication: Secondary | ICD-10-CM | POA: Diagnosis not present

## 2017-07-22 DIAGNOSIS — N186 End stage renal disease: Secondary | ICD-10-CM | POA: Diagnosis not present

## 2017-07-22 DIAGNOSIS — Z992 Dependence on renal dialysis: Secondary | ICD-10-CM | POA: Diagnosis not present

## 2017-07-22 DIAGNOSIS — N2581 Secondary hyperparathyroidism of renal origin: Secondary | ICD-10-CM | POA: Diagnosis not present

## 2017-07-23 DIAGNOSIS — N186 End stage renal disease: Secondary | ICD-10-CM | POA: Diagnosis not present

## 2017-07-23 DIAGNOSIS — Z992 Dependence on renal dialysis: Secondary | ICD-10-CM | POA: Diagnosis not present

## 2017-07-23 DIAGNOSIS — T82858A Stenosis of vascular prosthetic devices, implants and grafts, initial encounter: Secondary | ICD-10-CM | POA: Diagnosis not present

## 2017-07-23 DIAGNOSIS — I871 Compression of vein: Secondary | ICD-10-CM | POA: Diagnosis not present

## 2017-07-24 DIAGNOSIS — E1129 Type 2 diabetes mellitus with other diabetic kidney complication: Secondary | ICD-10-CM | POA: Diagnosis not present

## 2017-07-24 DIAGNOSIS — Z992 Dependence on renal dialysis: Secondary | ICD-10-CM | POA: Diagnosis not present

## 2017-07-24 DIAGNOSIS — D631 Anemia in chronic kidney disease: Secondary | ICD-10-CM | POA: Diagnosis not present

## 2017-07-24 DIAGNOSIS — N2581 Secondary hyperparathyroidism of renal origin: Secondary | ICD-10-CM | POA: Diagnosis not present

## 2017-07-24 DIAGNOSIS — N186 End stage renal disease: Secondary | ICD-10-CM | POA: Diagnosis not present

## 2017-07-27 DIAGNOSIS — N186 End stage renal disease: Secondary | ICD-10-CM | POA: Diagnosis not present

## 2017-07-27 DIAGNOSIS — D631 Anemia in chronic kidney disease: Secondary | ICD-10-CM | POA: Diagnosis not present

## 2017-07-27 DIAGNOSIS — Z992 Dependence on renal dialysis: Secondary | ICD-10-CM | POA: Diagnosis not present

## 2017-07-27 DIAGNOSIS — N2581 Secondary hyperparathyroidism of renal origin: Secondary | ICD-10-CM | POA: Diagnosis not present

## 2017-07-27 DIAGNOSIS — E1129 Type 2 diabetes mellitus with other diabetic kidney complication: Secondary | ICD-10-CM | POA: Diagnosis not present

## 2017-07-29 DIAGNOSIS — D631 Anemia in chronic kidney disease: Secondary | ICD-10-CM | POA: Diagnosis not present

## 2017-07-29 DIAGNOSIS — N2581 Secondary hyperparathyroidism of renal origin: Secondary | ICD-10-CM | POA: Diagnosis not present

## 2017-07-29 DIAGNOSIS — N186 End stage renal disease: Secondary | ICD-10-CM | POA: Diagnosis not present

## 2017-07-29 DIAGNOSIS — E1129 Type 2 diabetes mellitus with other diabetic kidney complication: Secondary | ICD-10-CM | POA: Diagnosis not present

## 2017-07-29 DIAGNOSIS — Z992 Dependence on renal dialysis: Secondary | ICD-10-CM | POA: Diagnosis not present

## 2017-07-31 DIAGNOSIS — Z992 Dependence on renal dialysis: Secondary | ICD-10-CM | POA: Diagnosis not present

## 2017-07-31 DIAGNOSIS — N2581 Secondary hyperparathyroidism of renal origin: Secondary | ICD-10-CM | POA: Diagnosis not present

## 2017-07-31 DIAGNOSIS — D631 Anemia in chronic kidney disease: Secondary | ICD-10-CM | POA: Diagnosis not present

## 2017-07-31 DIAGNOSIS — N186 End stage renal disease: Secondary | ICD-10-CM | POA: Diagnosis not present

## 2017-07-31 DIAGNOSIS — E1129 Type 2 diabetes mellitus with other diabetic kidney complication: Secondary | ICD-10-CM | POA: Diagnosis not present

## 2017-08-03 DIAGNOSIS — Z992 Dependence on renal dialysis: Secondary | ICD-10-CM | POA: Diagnosis not present

## 2017-08-03 DIAGNOSIS — D631 Anemia in chronic kidney disease: Secondary | ICD-10-CM | POA: Diagnosis not present

## 2017-08-03 DIAGNOSIS — N186 End stage renal disease: Secondary | ICD-10-CM | POA: Diagnosis not present

## 2017-08-03 DIAGNOSIS — N2581 Secondary hyperparathyroidism of renal origin: Secondary | ICD-10-CM | POA: Diagnosis not present

## 2017-08-03 DIAGNOSIS — E1129 Type 2 diabetes mellitus with other diabetic kidney complication: Secondary | ICD-10-CM | POA: Diagnosis not present

## 2017-08-05 DIAGNOSIS — E1129 Type 2 diabetes mellitus with other diabetic kidney complication: Secondary | ICD-10-CM | POA: Diagnosis not present

## 2017-08-05 DIAGNOSIS — Z992 Dependence on renal dialysis: Secondary | ICD-10-CM | POA: Diagnosis not present

## 2017-08-05 DIAGNOSIS — D631 Anemia in chronic kidney disease: Secondary | ICD-10-CM | POA: Diagnosis not present

## 2017-08-05 DIAGNOSIS — N186 End stage renal disease: Secondary | ICD-10-CM | POA: Diagnosis not present

## 2017-08-05 DIAGNOSIS — N2581 Secondary hyperparathyroidism of renal origin: Secondary | ICD-10-CM | POA: Diagnosis not present

## 2017-08-07 DIAGNOSIS — D631 Anemia in chronic kidney disease: Secondary | ICD-10-CM | POA: Diagnosis not present

## 2017-08-07 DIAGNOSIS — E1129 Type 2 diabetes mellitus with other diabetic kidney complication: Secondary | ICD-10-CM | POA: Diagnosis not present

## 2017-08-07 DIAGNOSIS — N186 End stage renal disease: Secondary | ICD-10-CM | POA: Diagnosis not present

## 2017-08-07 DIAGNOSIS — Z992 Dependence on renal dialysis: Secondary | ICD-10-CM | POA: Diagnosis not present

## 2017-08-07 DIAGNOSIS — N2581 Secondary hyperparathyroidism of renal origin: Secondary | ICD-10-CM | POA: Diagnosis not present

## 2017-08-10 DIAGNOSIS — E1129 Type 2 diabetes mellitus with other diabetic kidney complication: Secondary | ICD-10-CM | POA: Diagnosis not present

## 2017-08-10 DIAGNOSIS — N2581 Secondary hyperparathyroidism of renal origin: Secondary | ICD-10-CM | POA: Diagnosis not present

## 2017-08-10 DIAGNOSIS — N186 End stage renal disease: Secondary | ICD-10-CM | POA: Diagnosis not present

## 2017-08-10 DIAGNOSIS — Z992 Dependence on renal dialysis: Secondary | ICD-10-CM | POA: Diagnosis not present

## 2017-08-10 DIAGNOSIS — D631 Anemia in chronic kidney disease: Secondary | ICD-10-CM | POA: Diagnosis not present

## 2017-08-12 DIAGNOSIS — D631 Anemia in chronic kidney disease: Secondary | ICD-10-CM | POA: Diagnosis not present

## 2017-08-12 DIAGNOSIS — N186 End stage renal disease: Secondary | ICD-10-CM | POA: Diagnosis not present

## 2017-08-12 DIAGNOSIS — E1129 Type 2 diabetes mellitus with other diabetic kidney complication: Secondary | ICD-10-CM | POA: Diagnosis not present

## 2017-08-12 DIAGNOSIS — N2581 Secondary hyperparathyroidism of renal origin: Secondary | ICD-10-CM | POA: Diagnosis not present

## 2017-08-12 DIAGNOSIS — Z992 Dependence on renal dialysis: Secondary | ICD-10-CM | POA: Diagnosis not present

## 2017-08-14 DIAGNOSIS — E1129 Type 2 diabetes mellitus with other diabetic kidney complication: Secondary | ICD-10-CM | POA: Diagnosis not present

## 2017-08-14 DIAGNOSIS — D631 Anemia in chronic kidney disease: Secondary | ICD-10-CM | POA: Diagnosis not present

## 2017-08-14 DIAGNOSIS — N2581 Secondary hyperparathyroidism of renal origin: Secondary | ICD-10-CM | POA: Diagnosis not present

## 2017-08-14 DIAGNOSIS — Z992 Dependence on renal dialysis: Secondary | ICD-10-CM | POA: Diagnosis not present

## 2017-08-14 DIAGNOSIS — N186 End stage renal disease: Secondary | ICD-10-CM | POA: Diagnosis not present

## 2017-08-17 DIAGNOSIS — N186 End stage renal disease: Secondary | ICD-10-CM | POA: Diagnosis not present

## 2017-08-17 DIAGNOSIS — N2581 Secondary hyperparathyroidism of renal origin: Secondary | ICD-10-CM | POA: Diagnosis not present

## 2017-08-17 DIAGNOSIS — Z992 Dependence on renal dialysis: Secondary | ICD-10-CM | POA: Diagnosis not present

## 2017-08-17 DIAGNOSIS — D631 Anemia in chronic kidney disease: Secondary | ICD-10-CM | POA: Diagnosis not present

## 2017-08-17 DIAGNOSIS — E1129 Type 2 diabetes mellitus with other diabetic kidney complication: Secondary | ICD-10-CM | POA: Diagnosis not present

## 2017-08-19 DIAGNOSIS — Z992 Dependence on renal dialysis: Secondary | ICD-10-CM | POA: Diagnosis not present

## 2017-08-19 DIAGNOSIS — N2581 Secondary hyperparathyroidism of renal origin: Secondary | ICD-10-CM | POA: Diagnosis not present

## 2017-08-19 DIAGNOSIS — E1129 Type 2 diabetes mellitus with other diabetic kidney complication: Secondary | ICD-10-CM | POA: Diagnosis not present

## 2017-08-19 DIAGNOSIS — N186 End stage renal disease: Secondary | ICD-10-CM | POA: Diagnosis not present

## 2017-08-19 DIAGNOSIS — D631 Anemia in chronic kidney disease: Secondary | ICD-10-CM | POA: Diagnosis not present

## 2017-08-21 DIAGNOSIS — D631 Anemia in chronic kidney disease: Secondary | ICD-10-CM | POA: Diagnosis not present

## 2017-08-21 DIAGNOSIS — N2581 Secondary hyperparathyroidism of renal origin: Secondary | ICD-10-CM | POA: Diagnosis not present

## 2017-08-21 DIAGNOSIS — D509 Iron deficiency anemia, unspecified: Secondary | ICD-10-CM | POA: Diagnosis not present

## 2017-08-21 DIAGNOSIS — E1129 Type 2 diabetes mellitus with other diabetic kidney complication: Secondary | ICD-10-CM | POA: Diagnosis not present

## 2017-08-21 DIAGNOSIS — N186 End stage renal disease: Secondary | ICD-10-CM | POA: Diagnosis not present

## 2017-08-24 DIAGNOSIS — E1129 Type 2 diabetes mellitus with other diabetic kidney complication: Secondary | ICD-10-CM | POA: Diagnosis not present

## 2017-08-24 DIAGNOSIS — D631 Anemia in chronic kidney disease: Secondary | ICD-10-CM | POA: Diagnosis not present

## 2017-08-24 DIAGNOSIS — N186 End stage renal disease: Secondary | ICD-10-CM | POA: Diagnosis not present

## 2017-08-24 DIAGNOSIS — N2581 Secondary hyperparathyroidism of renal origin: Secondary | ICD-10-CM | POA: Diagnosis not present

## 2017-08-24 DIAGNOSIS — D509 Iron deficiency anemia, unspecified: Secondary | ICD-10-CM | POA: Diagnosis not present

## 2017-08-26 DIAGNOSIS — D509 Iron deficiency anemia, unspecified: Secondary | ICD-10-CM | POA: Diagnosis not present

## 2017-08-26 DIAGNOSIS — N186 End stage renal disease: Secondary | ICD-10-CM | POA: Diagnosis not present

## 2017-08-26 DIAGNOSIS — E1129 Type 2 diabetes mellitus with other diabetic kidney complication: Secondary | ICD-10-CM | POA: Diagnosis not present

## 2017-08-26 DIAGNOSIS — N2581 Secondary hyperparathyroidism of renal origin: Secondary | ICD-10-CM | POA: Diagnosis not present

## 2017-08-26 DIAGNOSIS — D631 Anemia in chronic kidney disease: Secondary | ICD-10-CM | POA: Diagnosis not present

## 2017-08-28 DIAGNOSIS — D509 Iron deficiency anemia, unspecified: Secondary | ICD-10-CM | POA: Diagnosis not present

## 2017-08-28 DIAGNOSIS — N186 End stage renal disease: Secondary | ICD-10-CM | POA: Diagnosis not present

## 2017-08-28 DIAGNOSIS — N2581 Secondary hyperparathyroidism of renal origin: Secondary | ICD-10-CM | POA: Diagnosis not present

## 2017-08-28 DIAGNOSIS — D631 Anemia in chronic kidney disease: Secondary | ICD-10-CM | POA: Diagnosis not present

## 2017-08-28 DIAGNOSIS — E1129 Type 2 diabetes mellitus with other diabetic kidney complication: Secondary | ICD-10-CM | POA: Diagnosis not present

## 2017-08-31 DIAGNOSIS — E1129 Type 2 diabetes mellitus with other diabetic kidney complication: Secondary | ICD-10-CM | POA: Diagnosis not present

## 2017-08-31 DIAGNOSIS — D631 Anemia in chronic kidney disease: Secondary | ICD-10-CM | POA: Diagnosis not present

## 2017-08-31 DIAGNOSIS — N2581 Secondary hyperparathyroidism of renal origin: Secondary | ICD-10-CM | POA: Diagnosis not present

## 2017-08-31 DIAGNOSIS — N186 End stage renal disease: Secondary | ICD-10-CM | POA: Diagnosis not present

## 2017-08-31 DIAGNOSIS — D509 Iron deficiency anemia, unspecified: Secondary | ICD-10-CM | POA: Diagnosis not present

## 2017-09-02 DIAGNOSIS — D509 Iron deficiency anemia, unspecified: Secondary | ICD-10-CM | POA: Diagnosis not present

## 2017-09-02 DIAGNOSIS — N2581 Secondary hyperparathyroidism of renal origin: Secondary | ICD-10-CM | POA: Diagnosis not present

## 2017-09-02 DIAGNOSIS — D631 Anemia in chronic kidney disease: Secondary | ICD-10-CM | POA: Diagnosis not present

## 2017-09-02 DIAGNOSIS — N186 End stage renal disease: Secondary | ICD-10-CM | POA: Diagnosis not present

## 2017-09-02 DIAGNOSIS — E1129 Type 2 diabetes mellitus with other diabetic kidney complication: Secondary | ICD-10-CM | POA: Diagnosis not present

## 2017-09-04 DIAGNOSIS — D631 Anemia in chronic kidney disease: Secondary | ICD-10-CM | POA: Diagnosis not present

## 2017-09-04 DIAGNOSIS — D509 Iron deficiency anemia, unspecified: Secondary | ICD-10-CM | POA: Diagnosis not present

## 2017-09-04 DIAGNOSIS — N186 End stage renal disease: Secondary | ICD-10-CM | POA: Diagnosis not present

## 2017-09-04 DIAGNOSIS — E1129 Type 2 diabetes mellitus with other diabetic kidney complication: Secondary | ICD-10-CM | POA: Diagnosis not present

## 2017-09-04 DIAGNOSIS — N2581 Secondary hyperparathyroidism of renal origin: Secondary | ICD-10-CM | POA: Diagnosis not present

## 2017-09-06 DIAGNOSIS — N186 End stage renal disease: Secondary | ICD-10-CM | POA: Diagnosis not present

## 2017-09-06 DIAGNOSIS — D509 Iron deficiency anemia, unspecified: Secondary | ICD-10-CM | POA: Diagnosis not present

## 2017-09-06 DIAGNOSIS — E1129 Type 2 diabetes mellitus with other diabetic kidney complication: Secondary | ICD-10-CM | POA: Diagnosis not present

## 2017-09-06 DIAGNOSIS — D631 Anemia in chronic kidney disease: Secondary | ICD-10-CM | POA: Diagnosis not present

## 2017-09-06 DIAGNOSIS — N2581 Secondary hyperparathyroidism of renal origin: Secondary | ICD-10-CM | POA: Diagnosis not present

## 2017-09-08 DIAGNOSIS — N186 End stage renal disease: Secondary | ICD-10-CM | POA: Diagnosis not present

## 2017-09-08 DIAGNOSIS — N2581 Secondary hyperparathyroidism of renal origin: Secondary | ICD-10-CM | POA: Diagnosis not present

## 2017-09-08 DIAGNOSIS — E1129 Type 2 diabetes mellitus with other diabetic kidney complication: Secondary | ICD-10-CM | POA: Diagnosis not present

## 2017-09-08 DIAGNOSIS — D509 Iron deficiency anemia, unspecified: Secondary | ICD-10-CM | POA: Diagnosis not present

## 2017-09-08 DIAGNOSIS — D631 Anemia in chronic kidney disease: Secondary | ICD-10-CM | POA: Diagnosis not present

## 2017-09-11 DIAGNOSIS — N2581 Secondary hyperparathyroidism of renal origin: Secondary | ICD-10-CM | POA: Diagnosis not present

## 2017-09-11 DIAGNOSIS — N186 End stage renal disease: Secondary | ICD-10-CM | POA: Diagnosis not present

## 2017-09-11 DIAGNOSIS — D631 Anemia in chronic kidney disease: Secondary | ICD-10-CM | POA: Diagnosis not present

## 2017-09-11 DIAGNOSIS — E1129 Type 2 diabetes mellitus with other diabetic kidney complication: Secondary | ICD-10-CM | POA: Diagnosis not present

## 2017-09-11 DIAGNOSIS — D509 Iron deficiency anemia, unspecified: Secondary | ICD-10-CM | POA: Diagnosis not present

## 2017-09-14 DIAGNOSIS — N2581 Secondary hyperparathyroidism of renal origin: Secondary | ICD-10-CM | POA: Diagnosis not present

## 2017-09-14 DIAGNOSIS — E1129 Type 2 diabetes mellitus with other diabetic kidney complication: Secondary | ICD-10-CM | POA: Diagnosis not present

## 2017-09-14 DIAGNOSIS — D509 Iron deficiency anemia, unspecified: Secondary | ICD-10-CM | POA: Diagnosis not present

## 2017-09-14 DIAGNOSIS — D631 Anemia in chronic kidney disease: Secondary | ICD-10-CM | POA: Diagnosis not present

## 2017-09-14 DIAGNOSIS — N186 End stage renal disease: Secondary | ICD-10-CM | POA: Diagnosis not present

## 2017-09-16 DIAGNOSIS — N186 End stage renal disease: Secondary | ICD-10-CM | POA: Diagnosis not present

## 2017-09-16 DIAGNOSIS — D509 Iron deficiency anemia, unspecified: Secondary | ICD-10-CM | POA: Diagnosis not present

## 2017-09-16 DIAGNOSIS — E1129 Type 2 diabetes mellitus with other diabetic kidney complication: Secondary | ICD-10-CM | POA: Diagnosis not present

## 2017-09-16 DIAGNOSIS — N2581 Secondary hyperparathyroidism of renal origin: Secondary | ICD-10-CM | POA: Diagnosis not present

## 2017-09-16 DIAGNOSIS — D631 Anemia in chronic kidney disease: Secondary | ICD-10-CM | POA: Diagnosis not present

## 2017-09-18 DIAGNOSIS — N186 End stage renal disease: Secondary | ICD-10-CM | POA: Diagnosis not present

## 2017-09-18 DIAGNOSIS — D631 Anemia in chronic kidney disease: Secondary | ICD-10-CM | POA: Diagnosis not present

## 2017-09-18 DIAGNOSIS — E1129 Type 2 diabetes mellitus with other diabetic kidney complication: Secondary | ICD-10-CM | POA: Diagnosis not present

## 2017-09-18 DIAGNOSIS — N2581 Secondary hyperparathyroidism of renal origin: Secondary | ICD-10-CM | POA: Diagnosis not present

## 2017-09-18 DIAGNOSIS — D509 Iron deficiency anemia, unspecified: Secondary | ICD-10-CM | POA: Diagnosis not present

## 2017-09-18 DIAGNOSIS — Z992 Dependence on renal dialysis: Secondary | ICD-10-CM | POA: Diagnosis not present

## 2017-09-21 DIAGNOSIS — N2581 Secondary hyperparathyroidism of renal origin: Secondary | ICD-10-CM | POA: Diagnosis not present

## 2017-09-21 DIAGNOSIS — N186 End stage renal disease: Secondary | ICD-10-CM | POA: Diagnosis not present

## 2017-09-21 DIAGNOSIS — E1129 Type 2 diabetes mellitus with other diabetic kidney complication: Secondary | ICD-10-CM | POA: Diagnosis not present

## 2017-09-21 DIAGNOSIS — D631 Anemia in chronic kidney disease: Secondary | ICD-10-CM | POA: Diagnosis not present

## 2017-09-23 DIAGNOSIS — D631 Anemia in chronic kidney disease: Secondary | ICD-10-CM | POA: Diagnosis not present

## 2017-09-23 DIAGNOSIS — N2581 Secondary hyperparathyroidism of renal origin: Secondary | ICD-10-CM | POA: Diagnosis not present

## 2017-09-23 DIAGNOSIS — E1129 Type 2 diabetes mellitus with other diabetic kidney complication: Secondary | ICD-10-CM | POA: Diagnosis not present

## 2017-09-23 DIAGNOSIS — N186 End stage renal disease: Secondary | ICD-10-CM | POA: Diagnosis not present

## 2017-09-25 DIAGNOSIS — N2581 Secondary hyperparathyroidism of renal origin: Secondary | ICD-10-CM | POA: Diagnosis not present

## 2017-09-25 DIAGNOSIS — N186 End stage renal disease: Secondary | ICD-10-CM | POA: Diagnosis not present

## 2017-09-25 DIAGNOSIS — D631 Anemia in chronic kidney disease: Secondary | ICD-10-CM | POA: Diagnosis not present

## 2017-09-25 DIAGNOSIS — E1129 Type 2 diabetes mellitus with other diabetic kidney complication: Secondary | ICD-10-CM | POA: Diagnosis not present

## 2017-09-28 DIAGNOSIS — N2581 Secondary hyperparathyroidism of renal origin: Secondary | ICD-10-CM | POA: Diagnosis not present

## 2017-09-28 DIAGNOSIS — N186 End stage renal disease: Secondary | ICD-10-CM | POA: Diagnosis not present

## 2017-09-28 DIAGNOSIS — D631 Anemia in chronic kidney disease: Secondary | ICD-10-CM | POA: Diagnosis not present

## 2017-09-28 DIAGNOSIS — E1129 Type 2 diabetes mellitus with other diabetic kidney complication: Secondary | ICD-10-CM | POA: Diagnosis not present

## 2017-09-30 DIAGNOSIS — N2581 Secondary hyperparathyroidism of renal origin: Secondary | ICD-10-CM | POA: Diagnosis not present

## 2017-09-30 DIAGNOSIS — N186 End stage renal disease: Secondary | ICD-10-CM | POA: Diagnosis not present

## 2017-09-30 DIAGNOSIS — D631 Anemia in chronic kidney disease: Secondary | ICD-10-CM | POA: Diagnosis not present

## 2017-09-30 DIAGNOSIS — E1129 Type 2 diabetes mellitus with other diabetic kidney complication: Secondary | ICD-10-CM | POA: Diagnosis not present

## 2017-10-02 DIAGNOSIS — D631 Anemia in chronic kidney disease: Secondary | ICD-10-CM | POA: Diagnosis not present

## 2017-10-02 DIAGNOSIS — E1129 Type 2 diabetes mellitus with other diabetic kidney complication: Secondary | ICD-10-CM | POA: Diagnosis not present

## 2017-10-02 DIAGNOSIS — N2581 Secondary hyperparathyroidism of renal origin: Secondary | ICD-10-CM | POA: Diagnosis not present

## 2017-10-02 DIAGNOSIS — N186 End stage renal disease: Secondary | ICD-10-CM | POA: Diagnosis not present

## 2017-10-05 DIAGNOSIS — N186 End stage renal disease: Secondary | ICD-10-CM | POA: Diagnosis not present

## 2017-10-05 DIAGNOSIS — N2581 Secondary hyperparathyroidism of renal origin: Secondary | ICD-10-CM | POA: Diagnosis not present

## 2017-10-05 DIAGNOSIS — D631 Anemia in chronic kidney disease: Secondary | ICD-10-CM | POA: Diagnosis not present

## 2017-10-05 DIAGNOSIS — E1129 Type 2 diabetes mellitus with other diabetic kidney complication: Secondary | ICD-10-CM | POA: Diagnosis not present

## 2017-10-07 DIAGNOSIS — E1129 Type 2 diabetes mellitus with other diabetic kidney complication: Secondary | ICD-10-CM | POA: Diagnosis not present

## 2017-10-07 DIAGNOSIS — N186 End stage renal disease: Secondary | ICD-10-CM | POA: Diagnosis not present

## 2017-10-07 DIAGNOSIS — N2581 Secondary hyperparathyroidism of renal origin: Secondary | ICD-10-CM | POA: Diagnosis not present

## 2017-10-07 DIAGNOSIS — D631 Anemia in chronic kidney disease: Secondary | ICD-10-CM | POA: Diagnosis not present

## 2017-10-09 DIAGNOSIS — E1129 Type 2 diabetes mellitus with other diabetic kidney complication: Secondary | ICD-10-CM | POA: Diagnosis not present

## 2017-10-09 DIAGNOSIS — N186 End stage renal disease: Secondary | ICD-10-CM | POA: Diagnosis not present

## 2017-10-09 DIAGNOSIS — D631 Anemia in chronic kidney disease: Secondary | ICD-10-CM | POA: Diagnosis not present

## 2017-10-09 DIAGNOSIS — N2581 Secondary hyperparathyroidism of renal origin: Secondary | ICD-10-CM | POA: Diagnosis not present

## 2017-10-11 DIAGNOSIS — E1129 Type 2 diabetes mellitus with other diabetic kidney complication: Secondary | ICD-10-CM | POA: Diagnosis not present

## 2017-10-11 DIAGNOSIS — N2581 Secondary hyperparathyroidism of renal origin: Secondary | ICD-10-CM | POA: Diagnosis not present

## 2017-10-11 DIAGNOSIS — N186 End stage renal disease: Secondary | ICD-10-CM | POA: Diagnosis not present

## 2017-10-11 DIAGNOSIS — D631 Anemia in chronic kidney disease: Secondary | ICD-10-CM | POA: Diagnosis not present

## 2017-10-14 DIAGNOSIS — N186 End stage renal disease: Secondary | ICD-10-CM | POA: Diagnosis not present

## 2017-10-14 DIAGNOSIS — E1129 Type 2 diabetes mellitus with other diabetic kidney complication: Secondary | ICD-10-CM | POA: Diagnosis not present

## 2017-10-14 DIAGNOSIS — N2581 Secondary hyperparathyroidism of renal origin: Secondary | ICD-10-CM | POA: Diagnosis not present

## 2017-10-14 DIAGNOSIS — D631 Anemia in chronic kidney disease: Secondary | ICD-10-CM | POA: Diagnosis not present

## 2017-10-16 DIAGNOSIS — E1129 Type 2 diabetes mellitus with other diabetic kidney complication: Secondary | ICD-10-CM | POA: Diagnosis not present

## 2017-10-16 DIAGNOSIS — N2581 Secondary hyperparathyroidism of renal origin: Secondary | ICD-10-CM | POA: Diagnosis not present

## 2017-10-16 DIAGNOSIS — D631 Anemia in chronic kidney disease: Secondary | ICD-10-CM | POA: Diagnosis not present

## 2017-10-16 DIAGNOSIS — N186 End stage renal disease: Secondary | ICD-10-CM | POA: Diagnosis not present

## 2017-10-18 DIAGNOSIS — N186 End stage renal disease: Secondary | ICD-10-CM | POA: Diagnosis not present

## 2017-10-18 DIAGNOSIS — N2581 Secondary hyperparathyroidism of renal origin: Secondary | ICD-10-CM | POA: Diagnosis not present

## 2017-10-18 DIAGNOSIS — D631 Anemia in chronic kidney disease: Secondary | ICD-10-CM | POA: Diagnosis not present

## 2017-10-18 DIAGNOSIS — E1129 Type 2 diabetes mellitus with other diabetic kidney complication: Secondary | ICD-10-CM | POA: Diagnosis not present

## 2017-10-19 DIAGNOSIS — N186 End stage renal disease: Secondary | ICD-10-CM | POA: Diagnosis not present

## 2017-10-19 DIAGNOSIS — Z992 Dependence on renal dialysis: Secondary | ICD-10-CM | POA: Diagnosis not present

## 2017-10-19 DIAGNOSIS — E1129 Type 2 diabetes mellitus with other diabetic kidney complication: Secondary | ICD-10-CM | POA: Diagnosis not present

## 2017-10-21 DIAGNOSIS — D631 Anemia in chronic kidney disease: Secondary | ICD-10-CM | POA: Diagnosis not present

## 2017-10-21 DIAGNOSIS — E1129 Type 2 diabetes mellitus with other diabetic kidney complication: Secondary | ICD-10-CM | POA: Diagnosis not present

## 2017-10-21 DIAGNOSIS — N186 End stage renal disease: Secondary | ICD-10-CM | POA: Diagnosis not present

## 2017-10-21 DIAGNOSIS — D509 Iron deficiency anemia, unspecified: Secondary | ICD-10-CM | POA: Diagnosis not present

## 2017-10-21 DIAGNOSIS — N2581 Secondary hyperparathyroidism of renal origin: Secondary | ICD-10-CM | POA: Diagnosis not present

## 2017-10-23 DIAGNOSIS — D509 Iron deficiency anemia, unspecified: Secondary | ICD-10-CM | POA: Diagnosis not present

## 2017-10-23 DIAGNOSIS — N2581 Secondary hyperparathyroidism of renal origin: Secondary | ICD-10-CM | POA: Diagnosis not present

## 2017-10-23 DIAGNOSIS — N186 End stage renal disease: Secondary | ICD-10-CM | POA: Diagnosis not present

## 2017-10-23 DIAGNOSIS — D631 Anemia in chronic kidney disease: Secondary | ICD-10-CM | POA: Diagnosis not present

## 2017-10-23 DIAGNOSIS — E1129 Type 2 diabetes mellitus with other diabetic kidney complication: Secondary | ICD-10-CM | POA: Diagnosis not present

## 2017-10-26 DIAGNOSIS — N186 End stage renal disease: Secondary | ICD-10-CM | POA: Diagnosis not present

## 2017-10-26 DIAGNOSIS — N2581 Secondary hyperparathyroidism of renal origin: Secondary | ICD-10-CM | POA: Diagnosis not present

## 2017-10-26 DIAGNOSIS — D631 Anemia in chronic kidney disease: Secondary | ICD-10-CM | POA: Diagnosis not present

## 2017-10-26 DIAGNOSIS — D509 Iron deficiency anemia, unspecified: Secondary | ICD-10-CM | POA: Diagnosis not present

## 2017-10-26 DIAGNOSIS — E1129 Type 2 diabetes mellitus with other diabetic kidney complication: Secondary | ICD-10-CM | POA: Diagnosis not present

## 2017-10-28 DIAGNOSIS — N2581 Secondary hyperparathyroidism of renal origin: Secondary | ICD-10-CM | POA: Diagnosis not present

## 2017-10-28 DIAGNOSIS — D631 Anemia in chronic kidney disease: Secondary | ICD-10-CM | POA: Diagnosis not present

## 2017-10-28 DIAGNOSIS — D509 Iron deficiency anemia, unspecified: Secondary | ICD-10-CM | POA: Diagnosis not present

## 2017-10-28 DIAGNOSIS — N186 End stage renal disease: Secondary | ICD-10-CM | POA: Diagnosis not present

## 2017-10-28 DIAGNOSIS — E1129 Type 2 diabetes mellitus with other diabetic kidney complication: Secondary | ICD-10-CM | POA: Diagnosis not present

## 2017-10-30 DIAGNOSIS — D631 Anemia in chronic kidney disease: Secondary | ICD-10-CM | POA: Diagnosis not present

## 2017-10-30 DIAGNOSIS — N186 End stage renal disease: Secondary | ICD-10-CM | POA: Diagnosis not present

## 2017-10-30 DIAGNOSIS — D509 Iron deficiency anemia, unspecified: Secondary | ICD-10-CM | POA: Diagnosis not present

## 2017-10-30 DIAGNOSIS — E1129 Type 2 diabetes mellitus with other diabetic kidney complication: Secondary | ICD-10-CM | POA: Diagnosis not present

## 2017-10-30 DIAGNOSIS — N2581 Secondary hyperparathyroidism of renal origin: Secondary | ICD-10-CM | POA: Diagnosis not present

## 2017-11-02 DIAGNOSIS — D509 Iron deficiency anemia, unspecified: Secondary | ICD-10-CM | POA: Diagnosis not present

## 2017-11-02 DIAGNOSIS — E1129 Type 2 diabetes mellitus with other diabetic kidney complication: Secondary | ICD-10-CM | POA: Diagnosis not present

## 2017-11-02 DIAGNOSIS — N2581 Secondary hyperparathyroidism of renal origin: Secondary | ICD-10-CM | POA: Diagnosis not present

## 2017-11-02 DIAGNOSIS — N186 End stage renal disease: Secondary | ICD-10-CM | POA: Diagnosis not present

## 2017-11-02 DIAGNOSIS — D631 Anemia in chronic kidney disease: Secondary | ICD-10-CM | POA: Diagnosis not present

## 2017-11-04 DIAGNOSIS — D631 Anemia in chronic kidney disease: Secondary | ICD-10-CM | POA: Diagnosis not present

## 2017-11-04 DIAGNOSIS — N2581 Secondary hyperparathyroidism of renal origin: Secondary | ICD-10-CM | POA: Diagnosis not present

## 2017-11-04 DIAGNOSIS — E1129 Type 2 diabetes mellitus with other diabetic kidney complication: Secondary | ICD-10-CM | POA: Diagnosis not present

## 2017-11-04 DIAGNOSIS — D509 Iron deficiency anemia, unspecified: Secondary | ICD-10-CM | POA: Diagnosis not present

## 2017-11-04 DIAGNOSIS — N186 End stage renal disease: Secondary | ICD-10-CM | POA: Diagnosis not present

## 2017-11-06 DIAGNOSIS — N186 End stage renal disease: Secondary | ICD-10-CM | POA: Diagnosis not present

## 2017-11-06 DIAGNOSIS — D509 Iron deficiency anemia, unspecified: Secondary | ICD-10-CM | POA: Diagnosis not present

## 2017-11-06 DIAGNOSIS — E1129 Type 2 diabetes mellitus with other diabetic kidney complication: Secondary | ICD-10-CM | POA: Diagnosis not present

## 2017-11-06 DIAGNOSIS — N2581 Secondary hyperparathyroidism of renal origin: Secondary | ICD-10-CM | POA: Diagnosis not present

## 2017-11-06 DIAGNOSIS — D631 Anemia in chronic kidney disease: Secondary | ICD-10-CM | POA: Diagnosis not present

## 2017-11-09 DIAGNOSIS — E1129 Type 2 diabetes mellitus with other diabetic kidney complication: Secondary | ICD-10-CM | POA: Diagnosis not present

## 2017-11-09 DIAGNOSIS — N2581 Secondary hyperparathyroidism of renal origin: Secondary | ICD-10-CM | POA: Diagnosis not present

## 2017-11-09 DIAGNOSIS — N186 End stage renal disease: Secondary | ICD-10-CM | POA: Diagnosis not present

## 2017-11-09 DIAGNOSIS — D509 Iron deficiency anemia, unspecified: Secondary | ICD-10-CM | POA: Diagnosis not present

## 2017-11-09 DIAGNOSIS — D631 Anemia in chronic kidney disease: Secondary | ICD-10-CM | POA: Diagnosis not present

## 2017-11-11 DIAGNOSIS — D509 Iron deficiency anemia, unspecified: Secondary | ICD-10-CM | POA: Diagnosis not present

## 2017-11-11 DIAGNOSIS — D631 Anemia in chronic kidney disease: Secondary | ICD-10-CM | POA: Diagnosis not present

## 2017-11-11 DIAGNOSIS — E1129 Type 2 diabetes mellitus with other diabetic kidney complication: Secondary | ICD-10-CM | POA: Diagnosis not present

## 2017-11-11 DIAGNOSIS — N186 End stage renal disease: Secondary | ICD-10-CM | POA: Diagnosis not present

## 2017-11-11 DIAGNOSIS — N2581 Secondary hyperparathyroidism of renal origin: Secondary | ICD-10-CM | POA: Diagnosis not present

## 2017-11-13 DIAGNOSIS — N186 End stage renal disease: Secondary | ICD-10-CM | POA: Diagnosis not present

## 2017-11-13 DIAGNOSIS — D631 Anemia in chronic kidney disease: Secondary | ICD-10-CM | POA: Diagnosis not present

## 2017-11-13 DIAGNOSIS — E1129 Type 2 diabetes mellitus with other diabetic kidney complication: Secondary | ICD-10-CM | POA: Diagnosis not present

## 2017-11-13 DIAGNOSIS — D509 Iron deficiency anemia, unspecified: Secondary | ICD-10-CM | POA: Diagnosis not present

## 2017-11-13 DIAGNOSIS — N2581 Secondary hyperparathyroidism of renal origin: Secondary | ICD-10-CM | POA: Diagnosis not present

## 2017-11-16 DIAGNOSIS — D631 Anemia in chronic kidney disease: Secondary | ICD-10-CM | POA: Diagnosis not present

## 2017-11-16 DIAGNOSIS — E1129 Type 2 diabetes mellitus with other diabetic kidney complication: Secondary | ICD-10-CM | POA: Diagnosis not present

## 2017-11-16 DIAGNOSIS — N186 End stage renal disease: Secondary | ICD-10-CM | POA: Diagnosis not present

## 2017-11-16 DIAGNOSIS — N2581 Secondary hyperparathyroidism of renal origin: Secondary | ICD-10-CM | POA: Diagnosis not present

## 2017-11-16 DIAGNOSIS — D509 Iron deficiency anemia, unspecified: Secondary | ICD-10-CM | POA: Diagnosis not present

## 2017-11-18 DIAGNOSIS — E1129 Type 2 diabetes mellitus with other diabetic kidney complication: Secondary | ICD-10-CM | POA: Diagnosis not present

## 2017-11-18 DIAGNOSIS — D509 Iron deficiency anemia, unspecified: Secondary | ICD-10-CM | POA: Diagnosis not present

## 2017-11-18 DIAGNOSIS — N2581 Secondary hyperparathyroidism of renal origin: Secondary | ICD-10-CM | POA: Diagnosis not present

## 2017-11-18 DIAGNOSIS — N186 End stage renal disease: Secondary | ICD-10-CM | POA: Diagnosis not present

## 2017-11-18 DIAGNOSIS — D631 Anemia in chronic kidney disease: Secondary | ICD-10-CM | POA: Diagnosis not present

## 2017-11-19 DIAGNOSIS — N186 End stage renal disease: Secondary | ICD-10-CM | POA: Diagnosis not present

## 2017-11-19 DIAGNOSIS — Z992 Dependence on renal dialysis: Secondary | ICD-10-CM | POA: Diagnosis not present

## 2017-11-19 DIAGNOSIS — E1129 Type 2 diabetes mellitus with other diabetic kidney complication: Secondary | ICD-10-CM | POA: Diagnosis not present

## 2017-11-20 DIAGNOSIS — N186 End stage renal disease: Secondary | ICD-10-CM | POA: Diagnosis not present

## 2017-11-20 DIAGNOSIS — N2581 Secondary hyperparathyroidism of renal origin: Secondary | ICD-10-CM | POA: Diagnosis not present

## 2017-11-20 DIAGNOSIS — D631 Anemia in chronic kidney disease: Secondary | ICD-10-CM | POA: Diagnosis not present

## 2017-11-20 DIAGNOSIS — Z992 Dependence on renal dialysis: Secondary | ICD-10-CM | POA: Diagnosis not present

## 2017-11-20 DIAGNOSIS — E1129 Type 2 diabetes mellitus with other diabetic kidney complication: Secondary | ICD-10-CM | POA: Diagnosis not present

## 2017-11-20 DIAGNOSIS — D509 Iron deficiency anemia, unspecified: Secondary | ICD-10-CM | POA: Diagnosis not present

## 2017-11-23 DIAGNOSIS — N186 End stage renal disease: Secondary | ICD-10-CM | POA: Diagnosis not present

## 2017-11-23 DIAGNOSIS — N2581 Secondary hyperparathyroidism of renal origin: Secondary | ICD-10-CM | POA: Diagnosis not present

## 2017-11-23 DIAGNOSIS — E1129 Type 2 diabetes mellitus with other diabetic kidney complication: Secondary | ICD-10-CM | POA: Diagnosis not present

## 2017-11-23 DIAGNOSIS — D631 Anemia in chronic kidney disease: Secondary | ICD-10-CM | POA: Diagnosis not present

## 2017-11-23 DIAGNOSIS — Z992 Dependence on renal dialysis: Secondary | ICD-10-CM | POA: Diagnosis not present

## 2017-11-23 DIAGNOSIS — D509 Iron deficiency anemia, unspecified: Secondary | ICD-10-CM | POA: Diagnosis not present

## 2017-11-25 DIAGNOSIS — Z992 Dependence on renal dialysis: Secondary | ICD-10-CM | POA: Diagnosis not present

## 2017-11-25 DIAGNOSIS — D631 Anemia in chronic kidney disease: Secondary | ICD-10-CM | POA: Diagnosis not present

## 2017-11-25 DIAGNOSIS — E1129 Type 2 diabetes mellitus with other diabetic kidney complication: Secondary | ICD-10-CM | POA: Diagnosis not present

## 2017-11-25 DIAGNOSIS — N2581 Secondary hyperparathyroidism of renal origin: Secondary | ICD-10-CM | POA: Diagnosis not present

## 2017-11-25 DIAGNOSIS — N186 End stage renal disease: Secondary | ICD-10-CM | POA: Diagnosis not present

## 2017-11-25 DIAGNOSIS — D509 Iron deficiency anemia, unspecified: Secondary | ICD-10-CM | POA: Diagnosis not present

## 2017-11-27 DIAGNOSIS — D509 Iron deficiency anemia, unspecified: Secondary | ICD-10-CM | POA: Diagnosis not present

## 2017-11-27 DIAGNOSIS — D631 Anemia in chronic kidney disease: Secondary | ICD-10-CM | POA: Diagnosis not present

## 2017-11-27 DIAGNOSIS — Z992 Dependence on renal dialysis: Secondary | ICD-10-CM | POA: Diagnosis not present

## 2017-11-27 DIAGNOSIS — N2581 Secondary hyperparathyroidism of renal origin: Secondary | ICD-10-CM | POA: Diagnosis not present

## 2017-11-27 DIAGNOSIS — N186 End stage renal disease: Secondary | ICD-10-CM | POA: Diagnosis not present

## 2017-11-27 DIAGNOSIS — E1129 Type 2 diabetes mellitus with other diabetic kidney complication: Secondary | ICD-10-CM | POA: Diagnosis not present

## 2017-11-30 DIAGNOSIS — N2581 Secondary hyperparathyroidism of renal origin: Secondary | ICD-10-CM | POA: Diagnosis not present

## 2017-11-30 DIAGNOSIS — N186 End stage renal disease: Secondary | ICD-10-CM | POA: Diagnosis not present

## 2017-11-30 DIAGNOSIS — Z992 Dependence on renal dialysis: Secondary | ICD-10-CM | POA: Diagnosis not present

## 2017-11-30 DIAGNOSIS — E1129 Type 2 diabetes mellitus with other diabetic kidney complication: Secondary | ICD-10-CM | POA: Diagnosis not present

## 2017-11-30 DIAGNOSIS — D631 Anemia in chronic kidney disease: Secondary | ICD-10-CM | POA: Diagnosis not present

## 2017-11-30 DIAGNOSIS — D509 Iron deficiency anemia, unspecified: Secondary | ICD-10-CM | POA: Diagnosis not present

## 2017-12-02 DIAGNOSIS — D631 Anemia in chronic kidney disease: Secondary | ICD-10-CM | POA: Diagnosis not present

## 2017-12-02 DIAGNOSIS — N186 End stage renal disease: Secondary | ICD-10-CM | POA: Diagnosis not present

## 2017-12-02 DIAGNOSIS — D509 Iron deficiency anemia, unspecified: Secondary | ICD-10-CM | POA: Diagnosis not present

## 2017-12-02 DIAGNOSIS — N2581 Secondary hyperparathyroidism of renal origin: Secondary | ICD-10-CM | POA: Diagnosis not present

## 2017-12-02 DIAGNOSIS — Z992 Dependence on renal dialysis: Secondary | ICD-10-CM | POA: Diagnosis not present

## 2017-12-02 DIAGNOSIS — E1129 Type 2 diabetes mellitus with other diabetic kidney complication: Secondary | ICD-10-CM | POA: Diagnosis not present

## 2017-12-04 DIAGNOSIS — N186 End stage renal disease: Secondary | ICD-10-CM | POA: Diagnosis not present

## 2017-12-04 DIAGNOSIS — E1129 Type 2 diabetes mellitus with other diabetic kidney complication: Secondary | ICD-10-CM | POA: Diagnosis not present

## 2017-12-04 DIAGNOSIS — Z992 Dependence on renal dialysis: Secondary | ICD-10-CM | POA: Diagnosis not present

## 2017-12-04 DIAGNOSIS — D509 Iron deficiency anemia, unspecified: Secondary | ICD-10-CM | POA: Diagnosis not present

## 2017-12-04 DIAGNOSIS — D631 Anemia in chronic kidney disease: Secondary | ICD-10-CM | POA: Diagnosis not present

## 2017-12-04 DIAGNOSIS — N2581 Secondary hyperparathyroidism of renal origin: Secondary | ICD-10-CM | POA: Diagnosis not present

## 2017-12-07 DIAGNOSIS — E1129 Type 2 diabetes mellitus with other diabetic kidney complication: Secondary | ICD-10-CM | POA: Diagnosis not present

## 2017-12-07 DIAGNOSIS — N186 End stage renal disease: Secondary | ICD-10-CM | POA: Diagnosis not present

## 2017-12-07 DIAGNOSIS — Z992 Dependence on renal dialysis: Secondary | ICD-10-CM | POA: Diagnosis not present

## 2017-12-07 DIAGNOSIS — D509 Iron deficiency anemia, unspecified: Secondary | ICD-10-CM | POA: Diagnosis not present

## 2017-12-07 DIAGNOSIS — D631 Anemia in chronic kidney disease: Secondary | ICD-10-CM | POA: Diagnosis not present

## 2017-12-07 DIAGNOSIS — N2581 Secondary hyperparathyroidism of renal origin: Secondary | ICD-10-CM | POA: Diagnosis not present

## 2017-12-09 DIAGNOSIS — D509 Iron deficiency anemia, unspecified: Secondary | ICD-10-CM | POA: Diagnosis not present

## 2017-12-09 DIAGNOSIS — N186 End stage renal disease: Secondary | ICD-10-CM | POA: Diagnosis not present

## 2017-12-09 DIAGNOSIS — Z992 Dependence on renal dialysis: Secondary | ICD-10-CM | POA: Diagnosis not present

## 2017-12-09 DIAGNOSIS — E1129 Type 2 diabetes mellitus with other diabetic kidney complication: Secondary | ICD-10-CM | POA: Diagnosis not present

## 2017-12-09 DIAGNOSIS — D631 Anemia in chronic kidney disease: Secondary | ICD-10-CM | POA: Diagnosis not present

## 2017-12-09 DIAGNOSIS — N2581 Secondary hyperparathyroidism of renal origin: Secondary | ICD-10-CM | POA: Diagnosis not present

## 2017-12-11 DIAGNOSIS — Z992 Dependence on renal dialysis: Secondary | ICD-10-CM | POA: Diagnosis not present

## 2017-12-11 DIAGNOSIS — E1129 Type 2 diabetes mellitus with other diabetic kidney complication: Secondary | ICD-10-CM | POA: Diagnosis not present

## 2017-12-11 DIAGNOSIS — N186 End stage renal disease: Secondary | ICD-10-CM | POA: Diagnosis not present

## 2017-12-11 DIAGNOSIS — D509 Iron deficiency anemia, unspecified: Secondary | ICD-10-CM | POA: Diagnosis not present

## 2017-12-11 DIAGNOSIS — N2581 Secondary hyperparathyroidism of renal origin: Secondary | ICD-10-CM | POA: Diagnosis not present

## 2017-12-11 DIAGNOSIS — D631 Anemia in chronic kidney disease: Secondary | ICD-10-CM | POA: Diagnosis not present

## 2017-12-14 DIAGNOSIS — Z992 Dependence on renal dialysis: Secondary | ICD-10-CM | POA: Diagnosis not present

## 2017-12-14 DIAGNOSIS — N186 End stage renal disease: Secondary | ICD-10-CM | POA: Diagnosis not present

## 2017-12-14 DIAGNOSIS — D509 Iron deficiency anemia, unspecified: Secondary | ICD-10-CM | POA: Diagnosis not present

## 2017-12-14 DIAGNOSIS — D631 Anemia in chronic kidney disease: Secondary | ICD-10-CM | POA: Diagnosis not present

## 2017-12-14 DIAGNOSIS — E1129 Type 2 diabetes mellitus with other diabetic kidney complication: Secondary | ICD-10-CM | POA: Diagnosis not present

## 2017-12-14 DIAGNOSIS — N2581 Secondary hyperparathyroidism of renal origin: Secondary | ICD-10-CM | POA: Diagnosis not present

## 2017-12-16 DIAGNOSIS — E1129 Type 2 diabetes mellitus with other diabetic kidney complication: Secondary | ICD-10-CM | POA: Diagnosis not present

## 2017-12-16 DIAGNOSIS — N186 End stage renal disease: Secondary | ICD-10-CM | POA: Diagnosis not present

## 2017-12-16 DIAGNOSIS — N2581 Secondary hyperparathyroidism of renal origin: Secondary | ICD-10-CM | POA: Diagnosis not present

## 2017-12-16 DIAGNOSIS — D631 Anemia in chronic kidney disease: Secondary | ICD-10-CM | POA: Diagnosis not present

## 2017-12-16 DIAGNOSIS — Z992 Dependence on renal dialysis: Secondary | ICD-10-CM | POA: Diagnosis not present

## 2017-12-16 DIAGNOSIS — D509 Iron deficiency anemia, unspecified: Secondary | ICD-10-CM | POA: Diagnosis not present

## 2017-12-18 DIAGNOSIS — D631 Anemia in chronic kidney disease: Secondary | ICD-10-CM | POA: Diagnosis not present

## 2017-12-18 DIAGNOSIS — N186 End stage renal disease: Secondary | ICD-10-CM | POA: Diagnosis not present

## 2017-12-18 DIAGNOSIS — Z992 Dependence on renal dialysis: Secondary | ICD-10-CM | POA: Diagnosis not present

## 2017-12-18 DIAGNOSIS — N2581 Secondary hyperparathyroidism of renal origin: Secondary | ICD-10-CM | POA: Diagnosis not present

## 2017-12-18 DIAGNOSIS — E1129 Type 2 diabetes mellitus with other diabetic kidney complication: Secondary | ICD-10-CM | POA: Diagnosis not present

## 2017-12-21 DIAGNOSIS — N186 End stage renal disease: Secondary | ICD-10-CM | POA: Diagnosis not present

## 2017-12-21 DIAGNOSIS — Z992 Dependence on renal dialysis: Secondary | ICD-10-CM | POA: Diagnosis not present

## 2017-12-21 DIAGNOSIS — D631 Anemia in chronic kidney disease: Secondary | ICD-10-CM | POA: Diagnosis not present

## 2017-12-21 DIAGNOSIS — N2581 Secondary hyperparathyroidism of renal origin: Secondary | ICD-10-CM | POA: Diagnosis not present

## 2017-12-21 DIAGNOSIS — E1129 Type 2 diabetes mellitus with other diabetic kidney complication: Secondary | ICD-10-CM | POA: Diagnosis not present

## 2017-12-23 DIAGNOSIS — N2581 Secondary hyperparathyroidism of renal origin: Secondary | ICD-10-CM | POA: Diagnosis not present

## 2017-12-23 DIAGNOSIS — E1129 Type 2 diabetes mellitus with other diabetic kidney complication: Secondary | ICD-10-CM | POA: Diagnosis not present

## 2017-12-23 DIAGNOSIS — N186 End stage renal disease: Secondary | ICD-10-CM | POA: Diagnosis not present

## 2017-12-23 DIAGNOSIS — D631 Anemia in chronic kidney disease: Secondary | ICD-10-CM | POA: Diagnosis not present

## 2017-12-23 DIAGNOSIS — Z992 Dependence on renal dialysis: Secondary | ICD-10-CM | POA: Diagnosis not present

## 2017-12-26 DIAGNOSIS — N186 End stage renal disease: Secondary | ICD-10-CM | POA: Diagnosis not present

## 2017-12-28 DIAGNOSIS — D631 Anemia in chronic kidney disease: Secondary | ICD-10-CM | POA: Diagnosis not present

## 2017-12-28 DIAGNOSIS — E1129 Type 2 diabetes mellitus with other diabetic kidney complication: Secondary | ICD-10-CM | POA: Diagnosis not present

## 2017-12-28 DIAGNOSIS — N2581 Secondary hyperparathyroidism of renal origin: Secondary | ICD-10-CM | POA: Diagnosis not present

## 2017-12-28 DIAGNOSIS — Z992 Dependence on renal dialysis: Secondary | ICD-10-CM | POA: Diagnosis not present

## 2017-12-28 DIAGNOSIS — N186 End stage renal disease: Secondary | ICD-10-CM | POA: Diagnosis not present

## 2017-12-30 DIAGNOSIS — E1129 Type 2 diabetes mellitus with other diabetic kidney complication: Secondary | ICD-10-CM | POA: Diagnosis not present

## 2017-12-30 DIAGNOSIS — N186 End stage renal disease: Secondary | ICD-10-CM | POA: Diagnosis not present

## 2017-12-30 DIAGNOSIS — Z992 Dependence on renal dialysis: Secondary | ICD-10-CM | POA: Diagnosis not present

## 2017-12-30 DIAGNOSIS — D631 Anemia in chronic kidney disease: Secondary | ICD-10-CM | POA: Diagnosis not present

## 2017-12-30 DIAGNOSIS — N2581 Secondary hyperparathyroidism of renal origin: Secondary | ICD-10-CM | POA: Diagnosis not present

## 2018-01-01 DIAGNOSIS — D631 Anemia in chronic kidney disease: Secondary | ICD-10-CM | POA: Diagnosis not present

## 2018-01-01 DIAGNOSIS — E1129 Type 2 diabetes mellitus with other diabetic kidney complication: Secondary | ICD-10-CM | POA: Diagnosis not present

## 2018-01-01 DIAGNOSIS — Z992 Dependence on renal dialysis: Secondary | ICD-10-CM | POA: Diagnosis not present

## 2018-01-01 DIAGNOSIS — N2581 Secondary hyperparathyroidism of renal origin: Secondary | ICD-10-CM | POA: Diagnosis not present

## 2018-01-01 DIAGNOSIS — N186 End stage renal disease: Secondary | ICD-10-CM | POA: Diagnosis not present

## 2018-01-04 DIAGNOSIS — D631 Anemia in chronic kidney disease: Secondary | ICD-10-CM | POA: Diagnosis not present

## 2018-01-04 DIAGNOSIS — N186 End stage renal disease: Secondary | ICD-10-CM | POA: Diagnosis not present

## 2018-01-04 DIAGNOSIS — E1129 Type 2 diabetes mellitus with other diabetic kidney complication: Secondary | ICD-10-CM | POA: Diagnosis not present

## 2018-01-04 DIAGNOSIS — Z992 Dependence on renal dialysis: Secondary | ICD-10-CM | POA: Diagnosis not present

## 2018-01-04 DIAGNOSIS — N2581 Secondary hyperparathyroidism of renal origin: Secondary | ICD-10-CM | POA: Diagnosis not present

## 2018-01-06 DIAGNOSIS — N186 End stage renal disease: Secondary | ICD-10-CM | POA: Diagnosis not present

## 2018-01-06 DIAGNOSIS — Z992 Dependence on renal dialysis: Secondary | ICD-10-CM | POA: Diagnosis not present

## 2018-01-06 DIAGNOSIS — E1129 Type 2 diabetes mellitus with other diabetic kidney complication: Secondary | ICD-10-CM | POA: Diagnosis not present

## 2018-01-06 DIAGNOSIS — D631 Anemia in chronic kidney disease: Secondary | ICD-10-CM | POA: Diagnosis not present

## 2018-01-06 DIAGNOSIS — N2581 Secondary hyperparathyroidism of renal origin: Secondary | ICD-10-CM | POA: Diagnosis not present

## 2018-01-08 DIAGNOSIS — N2581 Secondary hyperparathyroidism of renal origin: Secondary | ICD-10-CM | POA: Diagnosis not present

## 2018-01-08 DIAGNOSIS — E1129 Type 2 diabetes mellitus with other diabetic kidney complication: Secondary | ICD-10-CM | POA: Diagnosis not present

## 2018-01-08 DIAGNOSIS — Z992 Dependence on renal dialysis: Secondary | ICD-10-CM | POA: Diagnosis not present

## 2018-01-08 DIAGNOSIS — N186 End stage renal disease: Secondary | ICD-10-CM | POA: Diagnosis not present

## 2018-01-08 DIAGNOSIS — D631 Anemia in chronic kidney disease: Secondary | ICD-10-CM | POA: Diagnosis not present

## 2018-01-11 DIAGNOSIS — N2581 Secondary hyperparathyroidism of renal origin: Secondary | ICD-10-CM | POA: Diagnosis not present

## 2018-01-11 DIAGNOSIS — Z992 Dependence on renal dialysis: Secondary | ICD-10-CM | POA: Diagnosis not present

## 2018-01-11 DIAGNOSIS — E039 Hypothyroidism, unspecified: Secondary | ICD-10-CM | POA: Insufficient documentation

## 2018-01-11 DIAGNOSIS — N186 End stage renal disease: Secondary | ICD-10-CM | POA: Diagnosis not present

## 2018-01-11 DIAGNOSIS — E1129 Type 2 diabetes mellitus with other diabetic kidney complication: Secondary | ICD-10-CM | POA: Diagnosis not present

## 2018-01-11 DIAGNOSIS — D631 Anemia in chronic kidney disease: Secondary | ICD-10-CM | POA: Diagnosis not present

## 2018-01-13 DIAGNOSIS — Z992 Dependence on renal dialysis: Secondary | ICD-10-CM | POA: Diagnosis not present

## 2018-01-13 DIAGNOSIS — E039 Hypothyroidism, unspecified: Secondary | ICD-10-CM | POA: Diagnosis not present

## 2018-01-13 DIAGNOSIS — D631 Anemia in chronic kidney disease: Secondary | ICD-10-CM | POA: Diagnosis not present

## 2018-01-13 DIAGNOSIS — E1129 Type 2 diabetes mellitus with other diabetic kidney complication: Secondary | ICD-10-CM | POA: Diagnosis not present

## 2018-01-13 DIAGNOSIS — N2581 Secondary hyperparathyroidism of renal origin: Secondary | ICD-10-CM | POA: Diagnosis not present

## 2018-01-13 DIAGNOSIS — N186 End stage renal disease: Secondary | ICD-10-CM | POA: Diagnosis not present

## 2018-01-15 DIAGNOSIS — D631 Anemia in chronic kidney disease: Secondary | ICD-10-CM | POA: Diagnosis not present

## 2018-01-15 DIAGNOSIS — E1129 Type 2 diabetes mellitus with other diabetic kidney complication: Secondary | ICD-10-CM | POA: Diagnosis not present

## 2018-01-15 DIAGNOSIS — Z992 Dependence on renal dialysis: Secondary | ICD-10-CM | POA: Diagnosis not present

## 2018-01-15 DIAGNOSIS — N2581 Secondary hyperparathyroidism of renal origin: Secondary | ICD-10-CM | POA: Diagnosis not present

## 2018-01-15 DIAGNOSIS — N186 End stage renal disease: Secondary | ICD-10-CM | POA: Diagnosis not present

## 2018-01-18 DIAGNOSIS — E1129 Type 2 diabetes mellitus with other diabetic kidney complication: Secondary | ICD-10-CM | POA: Diagnosis not present

## 2018-01-18 DIAGNOSIS — N2581 Secondary hyperparathyroidism of renal origin: Secondary | ICD-10-CM | POA: Diagnosis not present

## 2018-01-18 DIAGNOSIS — D631 Anemia in chronic kidney disease: Secondary | ICD-10-CM | POA: Diagnosis not present

## 2018-01-18 DIAGNOSIS — N186 End stage renal disease: Secondary | ICD-10-CM | POA: Diagnosis not present

## 2018-01-18 DIAGNOSIS — Z992 Dependence on renal dialysis: Secondary | ICD-10-CM | POA: Diagnosis not present

## 2018-01-20 DIAGNOSIS — N2581 Secondary hyperparathyroidism of renal origin: Secondary | ICD-10-CM | POA: Diagnosis not present

## 2018-01-20 DIAGNOSIS — E1129 Type 2 diabetes mellitus with other diabetic kidney complication: Secondary | ICD-10-CM | POA: Diagnosis not present

## 2018-01-20 DIAGNOSIS — N186 End stage renal disease: Secondary | ICD-10-CM | POA: Diagnosis not present

## 2018-01-20 DIAGNOSIS — D631 Anemia in chronic kidney disease: Secondary | ICD-10-CM | POA: Diagnosis not present

## 2018-01-20 DIAGNOSIS — Z992 Dependence on renal dialysis: Secondary | ICD-10-CM | POA: Diagnosis not present

## 2018-01-22 DIAGNOSIS — D631 Anemia in chronic kidney disease: Secondary | ICD-10-CM | POA: Diagnosis not present

## 2018-01-22 DIAGNOSIS — N186 End stage renal disease: Secondary | ICD-10-CM | POA: Diagnosis not present

## 2018-01-22 DIAGNOSIS — N2581 Secondary hyperparathyroidism of renal origin: Secondary | ICD-10-CM | POA: Diagnosis not present

## 2018-01-22 DIAGNOSIS — E1129 Type 2 diabetes mellitus with other diabetic kidney complication: Secondary | ICD-10-CM | POA: Diagnosis not present

## 2018-01-22 DIAGNOSIS — Z992 Dependence on renal dialysis: Secondary | ICD-10-CM | POA: Diagnosis not present

## 2018-01-25 DIAGNOSIS — N2581 Secondary hyperparathyroidism of renal origin: Secondary | ICD-10-CM | POA: Diagnosis not present

## 2018-01-25 DIAGNOSIS — E1129 Type 2 diabetes mellitus with other diabetic kidney complication: Secondary | ICD-10-CM | POA: Diagnosis not present

## 2018-01-25 DIAGNOSIS — N186 End stage renal disease: Secondary | ICD-10-CM | POA: Diagnosis not present

## 2018-01-25 DIAGNOSIS — Z992 Dependence on renal dialysis: Secondary | ICD-10-CM | POA: Diagnosis not present

## 2018-01-25 DIAGNOSIS — D631 Anemia in chronic kidney disease: Secondary | ICD-10-CM | POA: Diagnosis not present

## 2018-01-27 DIAGNOSIS — N2581 Secondary hyperparathyroidism of renal origin: Secondary | ICD-10-CM | POA: Diagnosis not present

## 2018-01-27 DIAGNOSIS — N186 End stage renal disease: Secondary | ICD-10-CM | POA: Diagnosis not present

## 2018-01-27 DIAGNOSIS — D631 Anemia in chronic kidney disease: Secondary | ICD-10-CM | POA: Diagnosis not present

## 2018-01-27 DIAGNOSIS — Z992 Dependence on renal dialysis: Secondary | ICD-10-CM | POA: Diagnosis not present

## 2018-01-27 DIAGNOSIS — E1129 Type 2 diabetes mellitus with other diabetic kidney complication: Secondary | ICD-10-CM | POA: Diagnosis not present

## 2018-01-29 DIAGNOSIS — D631 Anemia in chronic kidney disease: Secondary | ICD-10-CM | POA: Diagnosis not present

## 2018-01-29 DIAGNOSIS — N186 End stage renal disease: Secondary | ICD-10-CM | POA: Diagnosis not present

## 2018-01-29 DIAGNOSIS — N2581 Secondary hyperparathyroidism of renal origin: Secondary | ICD-10-CM | POA: Diagnosis not present

## 2018-01-29 DIAGNOSIS — E1129 Type 2 diabetes mellitus with other diabetic kidney complication: Secondary | ICD-10-CM | POA: Diagnosis not present

## 2018-01-29 DIAGNOSIS — Z992 Dependence on renal dialysis: Secondary | ICD-10-CM | POA: Diagnosis not present

## 2018-02-01 DIAGNOSIS — N2581 Secondary hyperparathyroidism of renal origin: Secondary | ICD-10-CM | POA: Diagnosis not present

## 2018-02-01 DIAGNOSIS — N186 End stage renal disease: Secondary | ICD-10-CM | POA: Diagnosis not present

## 2018-02-01 DIAGNOSIS — D631 Anemia in chronic kidney disease: Secondary | ICD-10-CM | POA: Diagnosis not present

## 2018-02-01 DIAGNOSIS — Z992 Dependence on renal dialysis: Secondary | ICD-10-CM | POA: Diagnosis not present

## 2018-02-01 DIAGNOSIS — E1129 Type 2 diabetes mellitus with other diabetic kidney complication: Secondary | ICD-10-CM | POA: Diagnosis not present

## 2018-02-03 DIAGNOSIS — E1129 Type 2 diabetes mellitus with other diabetic kidney complication: Secondary | ICD-10-CM | POA: Diagnosis not present

## 2018-02-03 DIAGNOSIS — D631 Anemia in chronic kidney disease: Secondary | ICD-10-CM | POA: Diagnosis not present

## 2018-02-03 DIAGNOSIS — N2581 Secondary hyperparathyroidism of renal origin: Secondary | ICD-10-CM | POA: Diagnosis not present

## 2018-02-03 DIAGNOSIS — N186 End stage renal disease: Secondary | ICD-10-CM | POA: Diagnosis not present

## 2018-02-03 DIAGNOSIS — Z992 Dependence on renal dialysis: Secondary | ICD-10-CM | POA: Diagnosis not present

## 2018-02-05 DIAGNOSIS — D631 Anemia in chronic kidney disease: Secondary | ICD-10-CM | POA: Diagnosis not present

## 2018-02-05 DIAGNOSIS — N2581 Secondary hyperparathyroidism of renal origin: Secondary | ICD-10-CM | POA: Diagnosis not present

## 2018-02-05 DIAGNOSIS — E1129 Type 2 diabetes mellitus with other diabetic kidney complication: Secondary | ICD-10-CM | POA: Diagnosis not present

## 2018-02-05 DIAGNOSIS — Z992 Dependence on renal dialysis: Secondary | ICD-10-CM | POA: Diagnosis not present

## 2018-02-05 DIAGNOSIS — N186 End stage renal disease: Secondary | ICD-10-CM | POA: Diagnosis not present

## 2018-02-08 DIAGNOSIS — N186 End stage renal disease: Secondary | ICD-10-CM | POA: Diagnosis not present

## 2018-02-08 DIAGNOSIS — E1129 Type 2 diabetes mellitus with other diabetic kidney complication: Secondary | ICD-10-CM | POA: Diagnosis not present

## 2018-02-08 DIAGNOSIS — Z992 Dependence on renal dialysis: Secondary | ICD-10-CM | POA: Diagnosis not present

## 2018-02-08 DIAGNOSIS — N2581 Secondary hyperparathyroidism of renal origin: Secondary | ICD-10-CM | POA: Diagnosis not present

## 2018-02-08 DIAGNOSIS — D631 Anemia in chronic kidney disease: Secondary | ICD-10-CM | POA: Diagnosis not present

## 2018-02-10 DIAGNOSIS — N186 End stage renal disease: Secondary | ICD-10-CM | POA: Diagnosis not present

## 2018-02-10 DIAGNOSIS — E1129 Type 2 diabetes mellitus with other diabetic kidney complication: Secondary | ICD-10-CM | POA: Diagnosis not present

## 2018-02-10 DIAGNOSIS — Z992 Dependence on renal dialysis: Secondary | ICD-10-CM | POA: Diagnosis not present

## 2018-02-10 DIAGNOSIS — N2581 Secondary hyperparathyroidism of renal origin: Secondary | ICD-10-CM | POA: Diagnosis not present

## 2018-02-10 DIAGNOSIS — D631 Anemia in chronic kidney disease: Secondary | ICD-10-CM | POA: Diagnosis not present

## 2018-02-12 DIAGNOSIS — N186 End stage renal disease: Secondary | ICD-10-CM | POA: Diagnosis not present

## 2018-02-12 DIAGNOSIS — D631 Anemia in chronic kidney disease: Secondary | ICD-10-CM | POA: Diagnosis not present

## 2018-02-12 DIAGNOSIS — Z992 Dependence on renal dialysis: Secondary | ICD-10-CM | POA: Diagnosis not present

## 2018-02-12 DIAGNOSIS — E1129 Type 2 diabetes mellitus with other diabetic kidney complication: Secondary | ICD-10-CM | POA: Diagnosis not present

## 2018-02-12 DIAGNOSIS — N2581 Secondary hyperparathyroidism of renal origin: Secondary | ICD-10-CM | POA: Diagnosis not present

## 2018-02-15 DIAGNOSIS — N2581 Secondary hyperparathyroidism of renal origin: Secondary | ICD-10-CM | POA: Diagnosis not present

## 2018-02-15 DIAGNOSIS — Z992 Dependence on renal dialysis: Secondary | ICD-10-CM | POA: Diagnosis not present

## 2018-02-15 DIAGNOSIS — D631 Anemia in chronic kidney disease: Secondary | ICD-10-CM | POA: Diagnosis not present

## 2018-02-15 DIAGNOSIS — N186 End stage renal disease: Secondary | ICD-10-CM | POA: Diagnosis not present

## 2018-02-15 DIAGNOSIS — E1129 Type 2 diabetes mellitus with other diabetic kidney complication: Secondary | ICD-10-CM | POA: Diagnosis not present

## 2018-02-17 DIAGNOSIS — N2581 Secondary hyperparathyroidism of renal origin: Secondary | ICD-10-CM | POA: Diagnosis not present

## 2018-02-17 DIAGNOSIS — Z992 Dependence on renal dialysis: Secondary | ICD-10-CM | POA: Diagnosis not present

## 2018-02-17 DIAGNOSIS — E1129 Type 2 diabetes mellitus with other diabetic kidney complication: Secondary | ICD-10-CM | POA: Diagnosis not present

## 2018-02-17 DIAGNOSIS — N186 End stage renal disease: Secondary | ICD-10-CM | POA: Diagnosis not present

## 2018-02-19 DIAGNOSIS — N186 End stage renal disease: Secondary | ICD-10-CM | POA: Diagnosis not present

## 2018-02-19 DIAGNOSIS — N2581 Secondary hyperparathyroidism of renal origin: Secondary | ICD-10-CM | POA: Diagnosis not present

## 2018-02-19 DIAGNOSIS — E1129 Type 2 diabetes mellitus with other diabetic kidney complication: Secondary | ICD-10-CM | POA: Diagnosis not present

## 2018-02-19 DIAGNOSIS — Z992 Dependence on renal dialysis: Secondary | ICD-10-CM | POA: Diagnosis not present

## 2018-02-22 DIAGNOSIS — N2581 Secondary hyperparathyroidism of renal origin: Secondary | ICD-10-CM | POA: Diagnosis not present

## 2018-02-22 DIAGNOSIS — Z992 Dependence on renal dialysis: Secondary | ICD-10-CM | POA: Diagnosis not present

## 2018-02-22 DIAGNOSIS — N186 End stage renal disease: Secondary | ICD-10-CM | POA: Diagnosis not present

## 2018-02-22 DIAGNOSIS — E1129 Type 2 diabetes mellitus with other diabetic kidney complication: Secondary | ICD-10-CM | POA: Diagnosis not present

## 2018-02-24 DIAGNOSIS — N2581 Secondary hyperparathyroidism of renal origin: Secondary | ICD-10-CM | POA: Diagnosis not present

## 2018-02-24 DIAGNOSIS — E1129 Type 2 diabetes mellitus with other diabetic kidney complication: Secondary | ICD-10-CM | POA: Diagnosis not present

## 2018-02-24 DIAGNOSIS — N186 End stage renal disease: Secondary | ICD-10-CM | POA: Diagnosis not present

## 2018-02-24 DIAGNOSIS — Z992 Dependence on renal dialysis: Secondary | ICD-10-CM | POA: Diagnosis not present

## 2018-02-26 DIAGNOSIS — E1129 Type 2 diabetes mellitus with other diabetic kidney complication: Secondary | ICD-10-CM | POA: Diagnosis not present

## 2018-02-26 DIAGNOSIS — Z992 Dependence on renal dialysis: Secondary | ICD-10-CM | POA: Diagnosis not present

## 2018-02-26 DIAGNOSIS — N186 End stage renal disease: Secondary | ICD-10-CM | POA: Diagnosis not present

## 2018-02-26 DIAGNOSIS — N2581 Secondary hyperparathyroidism of renal origin: Secondary | ICD-10-CM | POA: Diagnosis not present

## 2018-03-01 DIAGNOSIS — N186 End stage renal disease: Secondary | ICD-10-CM | POA: Diagnosis not present

## 2018-03-01 DIAGNOSIS — N2581 Secondary hyperparathyroidism of renal origin: Secondary | ICD-10-CM | POA: Diagnosis not present

## 2018-03-01 DIAGNOSIS — Z992 Dependence on renal dialysis: Secondary | ICD-10-CM | POA: Diagnosis not present

## 2018-03-01 DIAGNOSIS — E1129 Type 2 diabetes mellitus with other diabetic kidney complication: Secondary | ICD-10-CM | POA: Diagnosis not present

## 2018-03-05 DIAGNOSIS — N2581 Secondary hyperparathyroidism of renal origin: Secondary | ICD-10-CM | POA: Diagnosis not present

## 2018-03-05 DIAGNOSIS — N186 End stage renal disease: Secondary | ICD-10-CM | POA: Diagnosis not present

## 2018-03-05 DIAGNOSIS — Z992 Dependence on renal dialysis: Secondary | ICD-10-CM | POA: Diagnosis not present

## 2018-03-05 DIAGNOSIS — E1129 Type 2 diabetes mellitus with other diabetic kidney complication: Secondary | ICD-10-CM | POA: Diagnosis not present

## 2018-03-08 DIAGNOSIS — E1129 Type 2 diabetes mellitus with other diabetic kidney complication: Secondary | ICD-10-CM | POA: Diagnosis not present

## 2018-03-08 DIAGNOSIS — Z992 Dependence on renal dialysis: Secondary | ICD-10-CM | POA: Diagnosis not present

## 2018-03-08 DIAGNOSIS — N186 End stage renal disease: Secondary | ICD-10-CM | POA: Diagnosis not present

## 2018-03-08 DIAGNOSIS — N2581 Secondary hyperparathyroidism of renal origin: Secondary | ICD-10-CM | POA: Diagnosis not present

## 2018-03-10 DIAGNOSIS — N186 End stage renal disease: Secondary | ICD-10-CM | POA: Diagnosis not present

## 2018-03-10 DIAGNOSIS — E1129 Type 2 diabetes mellitus with other diabetic kidney complication: Secondary | ICD-10-CM | POA: Diagnosis not present

## 2018-03-10 DIAGNOSIS — N2581 Secondary hyperparathyroidism of renal origin: Secondary | ICD-10-CM | POA: Diagnosis not present

## 2018-03-10 DIAGNOSIS — Z992 Dependence on renal dialysis: Secondary | ICD-10-CM | POA: Diagnosis not present

## 2018-03-12 DIAGNOSIS — Z992 Dependence on renal dialysis: Secondary | ICD-10-CM | POA: Diagnosis not present

## 2018-03-12 DIAGNOSIS — E1129 Type 2 diabetes mellitus with other diabetic kidney complication: Secondary | ICD-10-CM | POA: Diagnosis not present

## 2018-03-12 DIAGNOSIS — N186 End stage renal disease: Secondary | ICD-10-CM | POA: Diagnosis not present

## 2018-03-12 DIAGNOSIS — N2581 Secondary hyperparathyroidism of renal origin: Secondary | ICD-10-CM | POA: Diagnosis not present

## 2018-03-15 DIAGNOSIS — E1129 Type 2 diabetes mellitus with other diabetic kidney complication: Secondary | ICD-10-CM | POA: Diagnosis not present

## 2018-03-15 DIAGNOSIS — Z992 Dependence on renal dialysis: Secondary | ICD-10-CM | POA: Diagnosis not present

## 2018-03-15 DIAGNOSIS — N2581 Secondary hyperparathyroidism of renal origin: Secondary | ICD-10-CM | POA: Diagnosis not present

## 2018-03-15 DIAGNOSIS — N186 End stage renal disease: Secondary | ICD-10-CM | POA: Diagnosis not present

## 2018-03-17 DIAGNOSIS — E1129 Type 2 diabetes mellitus with other diabetic kidney complication: Secondary | ICD-10-CM | POA: Diagnosis not present

## 2018-03-17 DIAGNOSIS — N186 End stage renal disease: Secondary | ICD-10-CM | POA: Diagnosis not present

## 2018-03-17 DIAGNOSIS — N2581 Secondary hyperparathyroidism of renal origin: Secondary | ICD-10-CM | POA: Diagnosis not present

## 2018-03-17 DIAGNOSIS — Z992 Dependence on renal dialysis: Secondary | ICD-10-CM | POA: Diagnosis not present

## 2018-03-19 DIAGNOSIS — N2581 Secondary hyperparathyroidism of renal origin: Secondary | ICD-10-CM | POA: Diagnosis not present

## 2018-03-19 DIAGNOSIS — N186 End stage renal disease: Secondary | ICD-10-CM | POA: Diagnosis not present

## 2018-03-19 DIAGNOSIS — E1129 Type 2 diabetes mellitus with other diabetic kidney complication: Secondary | ICD-10-CM | POA: Diagnosis not present

## 2018-03-19 DIAGNOSIS — Z992 Dependence on renal dialysis: Secondary | ICD-10-CM | POA: Diagnosis not present

## 2018-03-20 DIAGNOSIS — N186 End stage renal disease: Secondary | ICD-10-CM | POA: Diagnosis not present

## 2018-03-20 DIAGNOSIS — E1129 Type 2 diabetes mellitus with other diabetic kidney complication: Secondary | ICD-10-CM | POA: Diagnosis not present

## 2018-03-20 DIAGNOSIS — Z992 Dependence on renal dialysis: Secondary | ICD-10-CM | POA: Diagnosis not present

## 2018-03-22 DIAGNOSIS — Z992 Dependence on renal dialysis: Secondary | ICD-10-CM | POA: Diagnosis not present

## 2018-03-22 DIAGNOSIS — D631 Anemia in chronic kidney disease: Secondary | ICD-10-CM | POA: Diagnosis not present

## 2018-03-22 DIAGNOSIS — N2581 Secondary hyperparathyroidism of renal origin: Secondary | ICD-10-CM | POA: Diagnosis not present

## 2018-03-22 DIAGNOSIS — E1129 Type 2 diabetes mellitus with other diabetic kidney complication: Secondary | ICD-10-CM | POA: Diagnosis not present

## 2018-03-22 DIAGNOSIS — N186 End stage renal disease: Secondary | ICD-10-CM | POA: Diagnosis not present

## 2018-03-24 DIAGNOSIS — D631 Anemia in chronic kidney disease: Secondary | ICD-10-CM | POA: Diagnosis not present

## 2018-03-24 DIAGNOSIS — N2581 Secondary hyperparathyroidism of renal origin: Secondary | ICD-10-CM | POA: Diagnosis not present

## 2018-03-24 DIAGNOSIS — Z992 Dependence on renal dialysis: Secondary | ICD-10-CM | POA: Diagnosis not present

## 2018-03-24 DIAGNOSIS — N186 End stage renal disease: Secondary | ICD-10-CM | POA: Diagnosis not present

## 2018-03-24 DIAGNOSIS — E1129 Type 2 diabetes mellitus with other diabetic kidney complication: Secondary | ICD-10-CM | POA: Diagnosis not present

## 2018-03-26 DIAGNOSIS — E1129 Type 2 diabetes mellitus with other diabetic kidney complication: Secondary | ICD-10-CM | POA: Diagnosis not present

## 2018-03-26 DIAGNOSIS — D631 Anemia in chronic kidney disease: Secondary | ICD-10-CM | POA: Diagnosis not present

## 2018-03-26 DIAGNOSIS — N2581 Secondary hyperparathyroidism of renal origin: Secondary | ICD-10-CM | POA: Diagnosis not present

## 2018-03-26 DIAGNOSIS — N186 End stage renal disease: Secondary | ICD-10-CM | POA: Diagnosis not present

## 2018-03-26 DIAGNOSIS — Z992 Dependence on renal dialysis: Secondary | ICD-10-CM | POA: Diagnosis not present

## 2018-03-29 DIAGNOSIS — N2581 Secondary hyperparathyroidism of renal origin: Secondary | ICD-10-CM | POA: Diagnosis not present

## 2018-03-29 DIAGNOSIS — E1129 Type 2 diabetes mellitus with other diabetic kidney complication: Secondary | ICD-10-CM | POA: Diagnosis not present

## 2018-03-29 DIAGNOSIS — Z992 Dependence on renal dialysis: Secondary | ICD-10-CM | POA: Diagnosis not present

## 2018-03-29 DIAGNOSIS — N186 End stage renal disease: Secondary | ICD-10-CM | POA: Diagnosis not present

## 2018-03-29 DIAGNOSIS — D631 Anemia in chronic kidney disease: Secondary | ICD-10-CM | POA: Diagnosis not present

## 2018-03-31 DIAGNOSIS — N2581 Secondary hyperparathyroidism of renal origin: Secondary | ICD-10-CM | POA: Diagnosis not present

## 2018-03-31 DIAGNOSIS — Z992 Dependence on renal dialysis: Secondary | ICD-10-CM | POA: Diagnosis not present

## 2018-03-31 DIAGNOSIS — D631 Anemia in chronic kidney disease: Secondary | ICD-10-CM | POA: Diagnosis not present

## 2018-03-31 DIAGNOSIS — E1129 Type 2 diabetes mellitus with other diabetic kidney complication: Secondary | ICD-10-CM | POA: Diagnosis not present

## 2018-03-31 DIAGNOSIS — N186 End stage renal disease: Secondary | ICD-10-CM | POA: Diagnosis not present

## 2018-04-02 DIAGNOSIS — E1129 Type 2 diabetes mellitus with other diabetic kidney complication: Secondary | ICD-10-CM | POA: Diagnosis not present

## 2018-04-02 DIAGNOSIS — D631 Anemia in chronic kidney disease: Secondary | ICD-10-CM | POA: Diagnosis not present

## 2018-04-02 DIAGNOSIS — N186 End stage renal disease: Secondary | ICD-10-CM | POA: Diagnosis not present

## 2018-04-02 DIAGNOSIS — N2581 Secondary hyperparathyroidism of renal origin: Secondary | ICD-10-CM | POA: Diagnosis not present

## 2018-04-02 DIAGNOSIS — Z992 Dependence on renal dialysis: Secondary | ICD-10-CM | POA: Diagnosis not present

## 2018-04-07 DIAGNOSIS — N186 End stage renal disease: Secondary | ICD-10-CM | POA: Diagnosis not present

## 2018-04-07 DIAGNOSIS — Z992 Dependence on renal dialysis: Secondary | ICD-10-CM | POA: Diagnosis not present

## 2018-04-07 DIAGNOSIS — E1129 Type 2 diabetes mellitus with other diabetic kidney complication: Secondary | ICD-10-CM | POA: Diagnosis not present

## 2018-04-07 DIAGNOSIS — D631 Anemia in chronic kidney disease: Secondary | ICD-10-CM | POA: Diagnosis not present

## 2018-04-07 DIAGNOSIS — N2581 Secondary hyperparathyroidism of renal origin: Secondary | ICD-10-CM | POA: Diagnosis not present

## 2018-04-09 DIAGNOSIS — E1129 Type 2 diabetes mellitus with other diabetic kidney complication: Secondary | ICD-10-CM | POA: Diagnosis not present

## 2018-04-09 DIAGNOSIS — Z992 Dependence on renal dialysis: Secondary | ICD-10-CM | POA: Diagnosis not present

## 2018-04-09 DIAGNOSIS — N2581 Secondary hyperparathyroidism of renal origin: Secondary | ICD-10-CM | POA: Diagnosis not present

## 2018-04-09 DIAGNOSIS — N186 End stage renal disease: Secondary | ICD-10-CM | POA: Diagnosis not present

## 2018-04-09 DIAGNOSIS — D631 Anemia in chronic kidney disease: Secondary | ICD-10-CM | POA: Diagnosis not present

## 2018-04-12 DIAGNOSIS — Z992 Dependence on renal dialysis: Secondary | ICD-10-CM | POA: Diagnosis not present

## 2018-04-12 DIAGNOSIS — D631 Anemia in chronic kidney disease: Secondary | ICD-10-CM | POA: Diagnosis not present

## 2018-04-12 DIAGNOSIS — N186 End stage renal disease: Secondary | ICD-10-CM | POA: Diagnosis not present

## 2018-04-12 DIAGNOSIS — N2581 Secondary hyperparathyroidism of renal origin: Secondary | ICD-10-CM | POA: Diagnosis not present

## 2018-04-12 DIAGNOSIS — E1129 Type 2 diabetes mellitus with other diabetic kidney complication: Secondary | ICD-10-CM | POA: Diagnosis not present

## 2018-04-14 DIAGNOSIS — N2581 Secondary hyperparathyroidism of renal origin: Secondary | ICD-10-CM | POA: Diagnosis not present

## 2018-04-14 DIAGNOSIS — Z992 Dependence on renal dialysis: Secondary | ICD-10-CM | POA: Diagnosis not present

## 2018-04-14 DIAGNOSIS — N186 End stage renal disease: Secondary | ICD-10-CM | POA: Diagnosis not present

## 2018-04-14 DIAGNOSIS — D631 Anemia in chronic kidney disease: Secondary | ICD-10-CM | POA: Diagnosis not present

## 2018-04-14 DIAGNOSIS — E1129 Type 2 diabetes mellitus with other diabetic kidney complication: Secondary | ICD-10-CM | POA: Diagnosis not present

## 2018-04-16 DIAGNOSIS — D631 Anemia in chronic kidney disease: Secondary | ICD-10-CM | POA: Diagnosis not present

## 2018-04-16 DIAGNOSIS — N2581 Secondary hyperparathyroidism of renal origin: Secondary | ICD-10-CM | POA: Diagnosis not present

## 2018-04-16 DIAGNOSIS — Z992 Dependence on renal dialysis: Secondary | ICD-10-CM | POA: Diagnosis not present

## 2018-04-16 DIAGNOSIS — N186 End stage renal disease: Secondary | ICD-10-CM | POA: Diagnosis not present

## 2018-04-16 DIAGNOSIS — E1129 Type 2 diabetes mellitus with other diabetic kidney complication: Secondary | ICD-10-CM | POA: Diagnosis not present

## 2018-04-19 DIAGNOSIS — D631 Anemia in chronic kidney disease: Secondary | ICD-10-CM | POA: Diagnosis not present

## 2018-04-19 DIAGNOSIS — Z992 Dependence on renal dialysis: Secondary | ICD-10-CM | POA: Diagnosis not present

## 2018-04-19 DIAGNOSIS — N186 End stage renal disease: Secondary | ICD-10-CM | POA: Diagnosis not present

## 2018-04-19 DIAGNOSIS — E1129 Type 2 diabetes mellitus with other diabetic kidney complication: Secondary | ICD-10-CM | POA: Diagnosis not present

## 2018-04-19 DIAGNOSIS — N2581 Secondary hyperparathyroidism of renal origin: Secondary | ICD-10-CM | POA: Diagnosis not present

## 2018-04-21 DIAGNOSIS — Z992 Dependence on renal dialysis: Secondary | ICD-10-CM | POA: Diagnosis not present

## 2018-04-21 DIAGNOSIS — N2581 Secondary hyperparathyroidism of renal origin: Secondary | ICD-10-CM | POA: Diagnosis not present

## 2018-04-21 DIAGNOSIS — D631 Anemia in chronic kidney disease: Secondary | ICD-10-CM | POA: Diagnosis not present

## 2018-04-21 DIAGNOSIS — N186 End stage renal disease: Secondary | ICD-10-CM | POA: Diagnosis not present

## 2018-04-21 DIAGNOSIS — E1129 Type 2 diabetes mellitus with other diabetic kidney complication: Secondary | ICD-10-CM | POA: Diagnosis not present

## 2018-04-23 DIAGNOSIS — Z992 Dependence on renal dialysis: Secondary | ICD-10-CM | POA: Diagnosis not present

## 2018-04-23 DIAGNOSIS — N2581 Secondary hyperparathyroidism of renal origin: Secondary | ICD-10-CM | POA: Diagnosis not present

## 2018-04-23 DIAGNOSIS — N186 End stage renal disease: Secondary | ICD-10-CM | POA: Diagnosis not present

## 2018-04-23 DIAGNOSIS — D631 Anemia in chronic kidney disease: Secondary | ICD-10-CM | POA: Diagnosis not present

## 2018-04-23 DIAGNOSIS — E1129 Type 2 diabetes mellitus with other diabetic kidney complication: Secondary | ICD-10-CM | POA: Diagnosis not present

## 2018-04-26 DIAGNOSIS — D631 Anemia in chronic kidney disease: Secondary | ICD-10-CM | POA: Diagnosis not present

## 2018-04-26 DIAGNOSIS — N186 End stage renal disease: Secondary | ICD-10-CM | POA: Diagnosis not present

## 2018-04-26 DIAGNOSIS — E1129 Type 2 diabetes mellitus with other diabetic kidney complication: Secondary | ICD-10-CM | POA: Diagnosis not present

## 2018-04-26 DIAGNOSIS — N2581 Secondary hyperparathyroidism of renal origin: Secondary | ICD-10-CM | POA: Diagnosis not present

## 2018-04-26 DIAGNOSIS — Z992 Dependence on renal dialysis: Secondary | ICD-10-CM | POA: Diagnosis not present

## 2018-04-28 DIAGNOSIS — D631 Anemia in chronic kidney disease: Secondary | ICD-10-CM | POA: Diagnosis not present

## 2018-04-28 DIAGNOSIS — N2581 Secondary hyperparathyroidism of renal origin: Secondary | ICD-10-CM | POA: Diagnosis not present

## 2018-04-28 DIAGNOSIS — E1129 Type 2 diabetes mellitus with other diabetic kidney complication: Secondary | ICD-10-CM | POA: Diagnosis not present

## 2018-04-28 DIAGNOSIS — N186 End stage renal disease: Secondary | ICD-10-CM | POA: Diagnosis not present

## 2018-04-28 DIAGNOSIS — Z992 Dependence on renal dialysis: Secondary | ICD-10-CM | POA: Diagnosis not present

## 2018-04-30 DIAGNOSIS — N2581 Secondary hyperparathyroidism of renal origin: Secondary | ICD-10-CM | POA: Diagnosis not present

## 2018-04-30 DIAGNOSIS — Z992 Dependence on renal dialysis: Secondary | ICD-10-CM | POA: Diagnosis not present

## 2018-04-30 DIAGNOSIS — N186 End stage renal disease: Secondary | ICD-10-CM | POA: Diagnosis not present

## 2018-04-30 DIAGNOSIS — E1129 Type 2 diabetes mellitus with other diabetic kidney complication: Secondary | ICD-10-CM | POA: Diagnosis not present

## 2018-04-30 DIAGNOSIS — D631 Anemia in chronic kidney disease: Secondary | ICD-10-CM | POA: Diagnosis not present

## 2018-05-03 DIAGNOSIS — Z992 Dependence on renal dialysis: Secondary | ICD-10-CM | POA: Diagnosis not present

## 2018-05-03 DIAGNOSIS — N2581 Secondary hyperparathyroidism of renal origin: Secondary | ICD-10-CM | POA: Diagnosis not present

## 2018-05-03 DIAGNOSIS — E1129 Type 2 diabetes mellitus with other diabetic kidney complication: Secondary | ICD-10-CM | POA: Diagnosis not present

## 2018-05-03 DIAGNOSIS — D631 Anemia in chronic kidney disease: Secondary | ICD-10-CM | POA: Diagnosis not present

## 2018-05-03 DIAGNOSIS — N186 End stage renal disease: Secondary | ICD-10-CM | POA: Diagnosis not present

## 2018-05-05 DIAGNOSIS — N186 End stage renal disease: Secondary | ICD-10-CM | POA: Diagnosis not present

## 2018-05-05 DIAGNOSIS — E1129 Type 2 diabetes mellitus with other diabetic kidney complication: Secondary | ICD-10-CM | POA: Diagnosis not present

## 2018-05-05 DIAGNOSIS — D631 Anemia in chronic kidney disease: Secondary | ICD-10-CM | POA: Diagnosis not present

## 2018-05-05 DIAGNOSIS — Z992 Dependence on renal dialysis: Secondary | ICD-10-CM | POA: Diagnosis not present

## 2018-05-05 DIAGNOSIS — N2581 Secondary hyperparathyroidism of renal origin: Secondary | ICD-10-CM | POA: Diagnosis not present

## 2018-05-07 DIAGNOSIS — E1129 Type 2 diabetes mellitus with other diabetic kidney complication: Secondary | ICD-10-CM | POA: Diagnosis not present

## 2018-05-07 DIAGNOSIS — Z992 Dependence on renal dialysis: Secondary | ICD-10-CM | POA: Diagnosis not present

## 2018-05-07 DIAGNOSIS — N2581 Secondary hyperparathyroidism of renal origin: Secondary | ICD-10-CM | POA: Diagnosis not present

## 2018-05-07 DIAGNOSIS — D631 Anemia in chronic kidney disease: Secondary | ICD-10-CM | POA: Diagnosis not present

## 2018-05-07 DIAGNOSIS — N186 End stage renal disease: Secondary | ICD-10-CM | POA: Diagnosis not present

## 2018-05-10 DIAGNOSIS — D631 Anemia in chronic kidney disease: Secondary | ICD-10-CM | POA: Diagnosis not present

## 2018-05-10 DIAGNOSIS — E1129 Type 2 diabetes mellitus with other diabetic kidney complication: Secondary | ICD-10-CM | POA: Diagnosis not present

## 2018-05-10 DIAGNOSIS — Z992 Dependence on renal dialysis: Secondary | ICD-10-CM | POA: Diagnosis not present

## 2018-05-10 DIAGNOSIS — N2581 Secondary hyperparathyroidism of renal origin: Secondary | ICD-10-CM | POA: Diagnosis not present

## 2018-05-10 DIAGNOSIS — N186 End stage renal disease: Secondary | ICD-10-CM | POA: Diagnosis not present

## 2018-05-11 DIAGNOSIS — T82858A Stenosis of vascular prosthetic devices, implants and grafts, initial encounter: Secondary | ICD-10-CM | POA: Diagnosis not present

## 2018-05-11 DIAGNOSIS — I871 Compression of vein: Secondary | ICD-10-CM | POA: Diagnosis not present

## 2018-05-11 DIAGNOSIS — N186 End stage renal disease: Secondary | ICD-10-CM | POA: Diagnosis not present

## 2018-05-11 DIAGNOSIS — Z992 Dependence on renal dialysis: Secondary | ICD-10-CM | POA: Diagnosis not present

## 2018-05-12 DIAGNOSIS — D631 Anemia in chronic kidney disease: Secondary | ICD-10-CM | POA: Diagnosis not present

## 2018-05-12 DIAGNOSIS — E1129 Type 2 diabetes mellitus with other diabetic kidney complication: Secondary | ICD-10-CM | POA: Diagnosis not present

## 2018-05-12 DIAGNOSIS — N186 End stage renal disease: Secondary | ICD-10-CM | POA: Diagnosis not present

## 2018-05-12 DIAGNOSIS — N2581 Secondary hyperparathyroidism of renal origin: Secondary | ICD-10-CM | POA: Diagnosis not present

## 2018-05-12 DIAGNOSIS — Z992 Dependence on renal dialysis: Secondary | ICD-10-CM | POA: Diagnosis not present

## 2018-05-14 DIAGNOSIS — Z992 Dependence on renal dialysis: Secondary | ICD-10-CM | POA: Diagnosis not present

## 2018-05-14 DIAGNOSIS — N186 End stage renal disease: Secondary | ICD-10-CM | POA: Diagnosis not present

## 2018-05-14 DIAGNOSIS — D631 Anemia in chronic kidney disease: Secondary | ICD-10-CM | POA: Diagnosis not present

## 2018-05-14 DIAGNOSIS — N2581 Secondary hyperparathyroidism of renal origin: Secondary | ICD-10-CM | POA: Diagnosis not present

## 2018-05-14 DIAGNOSIS — E1129 Type 2 diabetes mellitus with other diabetic kidney complication: Secondary | ICD-10-CM | POA: Diagnosis not present

## 2018-05-17 DIAGNOSIS — N186 End stage renal disease: Secondary | ICD-10-CM | POA: Diagnosis not present

## 2018-05-17 DIAGNOSIS — N2581 Secondary hyperparathyroidism of renal origin: Secondary | ICD-10-CM | POA: Diagnosis not present

## 2018-05-17 DIAGNOSIS — D631 Anemia in chronic kidney disease: Secondary | ICD-10-CM | POA: Diagnosis not present

## 2018-05-17 DIAGNOSIS — Z992 Dependence on renal dialysis: Secondary | ICD-10-CM | POA: Diagnosis not present

## 2018-05-17 DIAGNOSIS — E1129 Type 2 diabetes mellitus with other diabetic kidney complication: Secondary | ICD-10-CM | POA: Diagnosis not present

## 2018-05-19 DIAGNOSIS — N2581 Secondary hyperparathyroidism of renal origin: Secondary | ICD-10-CM | POA: Diagnosis not present

## 2018-05-19 DIAGNOSIS — Z992 Dependence on renal dialysis: Secondary | ICD-10-CM | POA: Diagnosis not present

## 2018-05-19 DIAGNOSIS — E1129 Type 2 diabetes mellitus with other diabetic kidney complication: Secondary | ICD-10-CM | POA: Diagnosis not present

## 2018-05-19 DIAGNOSIS — D631 Anemia in chronic kidney disease: Secondary | ICD-10-CM | POA: Diagnosis not present

## 2018-05-19 DIAGNOSIS — N186 End stage renal disease: Secondary | ICD-10-CM | POA: Diagnosis not present

## 2018-05-20 DIAGNOSIS — N186 End stage renal disease: Secondary | ICD-10-CM | POA: Diagnosis not present

## 2018-05-20 DIAGNOSIS — E1129 Type 2 diabetes mellitus with other diabetic kidney complication: Secondary | ICD-10-CM | POA: Diagnosis not present

## 2018-05-20 DIAGNOSIS — Z992 Dependence on renal dialysis: Secondary | ICD-10-CM | POA: Diagnosis not present

## 2018-05-21 DIAGNOSIS — N2581 Secondary hyperparathyroidism of renal origin: Secondary | ICD-10-CM | POA: Diagnosis not present

## 2018-05-21 DIAGNOSIS — E1129 Type 2 diabetes mellitus with other diabetic kidney complication: Secondary | ICD-10-CM | POA: Diagnosis not present

## 2018-05-21 DIAGNOSIS — D631 Anemia in chronic kidney disease: Secondary | ICD-10-CM | POA: Diagnosis not present

## 2018-05-21 DIAGNOSIS — Z992 Dependence on renal dialysis: Secondary | ICD-10-CM | POA: Diagnosis not present

## 2018-05-21 DIAGNOSIS — N186 End stage renal disease: Secondary | ICD-10-CM | POA: Diagnosis not present

## 2018-05-24 DIAGNOSIS — N186 End stage renal disease: Secondary | ICD-10-CM | POA: Diagnosis not present

## 2018-05-24 DIAGNOSIS — N2581 Secondary hyperparathyroidism of renal origin: Secondary | ICD-10-CM | POA: Diagnosis not present

## 2018-05-24 DIAGNOSIS — Z992 Dependence on renal dialysis: Secondary | ICD-10-CM | POA: Diagnosis not present

## 2018-05-24 DIAGNOSIS — E1129 Type 2 diabetes mellitus with other diabetic kidney complication: Secondary | ICD-10-CM | POA: Diagnosis not present

## 2018-05-24 DIAGNOSIS — D631 Anemia in chronic kidney disease: Secondary | ICD-10-CM | POA: Diagnosis not present

## 2018-05-26 DIAGNOSIS — N186 End stage renal disease: Secondary | ICD-10-CM | POA: Diagnosis not present

## 2018-05-26 DIAGNOSIS — E1129 Type 2 diabetes mellitus with other diabetic kidney complication: Secondary | ICD-10-CM | POA: Diagnosis not present

## 2018-05-26 DIAGNOSIS — D631 Anemia in chronic kidney disease: Secondary | ICD-10-CM | POA: Diagnosis not present

## 2018-05-26 DIAGNOSIS — N2581 Secondary hyperparathyroidism of renal origin: Secondary | ICD-10-CM | POA: Diagnosis not present

## 2018-05-26 DIAGNOSIS — Z992 Dependence on renal dialysis: Secondary | ICD-10-CM | POA: Diagnosis not present

## 2018-05-28 DIAGNOSIS — N2581 Secondary hyperparathyroidism of renal origin: Secondary | ICD-10-CM | POA: Diagnosis not present

## 2018-05-28 DIAGNOSIS — Z992 Dependence on renal dialysis: Secondary | ICD-10-CM | POA: Diagnosis not present

## 2018-05-28 DIAGNOSIS — E1129 Type 2 diabetes mellitus with other diabetic kidney complication: Secondary | ICD-10-CM | POA: Diagnosis not present

## 2018-05-28 DIAGNOSIS — N186 End stage renal disease: Secondary | ICD-10-CM | POA: Diagnosis not present

## 2018-05-28 DIAGNOSIS — D631 Anemia in chronic kidney disease: Secondary | ICD-10-CM | POA: Diagnosis not present

## 2018-05-31 DIAGNOSIS — Z992 Dependence on renal dialysis: Secondary | ICD-10-CM | POA: Diagnosis not present

## 2018-05-31 DIAGNOSIS — E1129 Type 2 diabetes mellitus with other diabetic kidney complication: Secondary | ICD-10-CM | POA: Diagnosis not present

## 2018-05-31 DIAGNOSIS — N186 End stage renal disease: Secondary | ICD-10-CM | POA: Diagnosis not present

## 2018-05-31 DIAGNOSIS — D631 Anemia in chronic kidney disease: Secondary | ICD-10-CM | POA: Diagnosis not present

## 2018-05-31 DIAGNOSIS — N2581 Secondary hyperparathyroidism of renal origin: Secondary | ICD-10-CM | POA: Diagnosis not present

## 2018-06-02 DIAGNOSIS — N186 End stage renal disease: Secondary | ICD-10-CM | POA: Diagnosis not present

## 2018-06-02 DIAGNOSIS — D631 Anemia in chronic kidney disease: Secondary | ICD-10-CM | POA: Diagnosis not present

## 2018-06-02 DIAGNOSIS — E1129 Type 2 diabetes mellitus with other diabetic kidney complication: Secondary | ICD-10-CM | POA: Diagnosis not present

## 2018-06-02 DIAGNOSIS — Z992 Dependence on renal dialysis: Secondary | ICD-10-CM | POA: Diagnosis not present

## 2018-06-02 DIAGNOSIS — N2581 Secondary hyperparathyroidism of renal origin: Secondary | ICD-10-CM | POA: Diagnosis not present

## 2018-06-04 DIAGNOSIS — Z992 Dependence on renal dialysis: Secondary | ICD-10-CM | POA: Diagnosis not present

## 2018-06-04 DIAGNOSIS — N2581 Secondary hyperparathyroidism of renal origin: Secondary | ICD-10-CM | POA: Diagnosis not present

## 2018-06-04 DIAGNOSIS — D631 Anemia in chronic kidney disease: Secondary | ICD-10-CM | POA: Diagnosis not present

## 2018-06-04 DIAGNOSIS — E1129 Type 2 diabetes mellitus with other diabetic kidney complication: Secondary | ICD-10-CM | POA: Diagnosis not present

## 2018-06-04 DIAGNOSIS — N186 End stage renal disease: Secondary | ICD-10-CM | POA: Diagnosis not present

## 2018-06-07 DIAGNOSIS — E1129 Type 2 diabetes mellitus with other diabetic kidney complication: Secondary | ICD-10-CM | POA: Diagnosis not present

## 2018-06-07 DIAGNOSIS — D631 Anemia in chronic kidney disease: Secondary | ICD-10-CM | POA: Diagnosis not present

## 2018-06-07 DIAGNOSIS — Z992 Dependence on renal dialysis: Secondary | ICD-10-CM | POA: Diagnosis not present

## 2018-06-07 DIAGNOSIS — N2581 Secondary hyperparathyroidism of renal origin: Secondary | ICD-10-CM | POA: Diagnosis not present

## 2018-06-07 DIAGNOSIS — N186 End stage renal disease: Secondary | ICD-10-CM | POA: Diagnosis not present

## 2018-06-09 DIAGNOSIS — E1129 Type 2 diabetes mellitus with other diabetic kidney complication: Secondary | ICD-10-CM | POA: Diagnosis not present

## 2018-06-09 DIAGNOSIS — Z992 Dependence on renal dialysis: Secondary | ICD-10-CM | POA: Diagnosis not present

## 2018-06-09 DIAGNOSIS — N186 End stage renal disease: Secondary | ICD-10-CM | POA: Diagnosis not present

## 2018-06-09 DIAGNOSIS — D631 Anemia in chronic kidney disease: Secondary | ICD-10-CM | POA: Diagnosis not present

## 2018-06-09 DIAGNOSIS — N2581 Secondary hyperparathyroidism of renal origin: Secondary | ICD-10-CM | POA: Diagnosis not present

## 2018-06-11 DIAGNOSIS — D631 Anemia in chronic kidney disease: Secondary | ICD-10-CM | POA: Diagnosis not present

## 2018-06-11 DIAGNOSIS — N186 End stage renal disease: Secondary | ICD-10-CM | POA: Diagnosis not present

## 2018-06-11 DIAGNOSIS — Z992 Dependence on renal dialysis: Secondary | ICD-10-CM | POA: Diagnosis not present

## 2018-06-11 DIAGNOSIS — E1129 Type 2 diabetes mellitus with other diabetic kidney complication: Secondary | ICD-10-CM | POA: Diagnosis not present

## 2018-06-11 DIAGNOSIS — N2581 Secondary hyperparathyroidism of renal origin: Secondary | ICD-10-CM | POA: Diagnosis not present

## 2018-06-14 DIAGNOSIS — D631 Anemia in chronic kidney disease: Secondary | ICD-10-CM | POA: Diagnosis not present

## 2018-06-14 DIAGNOSIS — N186 End stage renal disease: Secondary | ICD-10-CM | POA: Diagnosis not present

## 2018-06-14 DIAGNOSIS — E1129 Type 2 diabetes mellitus with other diabetic kidney complication: Secondary | ICD-10-CM | POA: Diagnosis not present

## 2018-06-14 DIAGNOSIS — Z992 Dependence on renal dialysis: Secondary | ICD-10-CM | POA: Diagnosis not present

## 2018-06-14 DIAGNOSIS — N2581 Secondary hyperparathyroidism of renal origin: Secondary | ICD-10-CM | POA: Diagnosis not present

## 2018-06-16 DIAGNOSIS — N2581 Secondary hyperparathyroidism of renal origin: Secondary | ICD-10-CM | POA: Diagnosis not present

## 2018-06-16 DIAGNOSIS — E1129 Type 2 diabetes mellitus with other diabetic kidney complication: Secondary | ICD-10-CM | POA: Diagnosis not present

## 2018-06-16 DIAGNOSIS — N186 End stage renal disease: Secondary | ICD-10-CM | POA: Diagnosis not present

## 2018-06-16 DIAGNOSIS — D631 Anemia in chronic kidney disease: Secondary | ICD-10-CM | POA: Diagnosis not present

## 2018-06-16 DIAGNOSIS — Z992 Dependence on renal dialysis: Secondary | ICD-10-CM | POA: Diagnosis not present

## 2018-06-18 DIAGNOSIS — Z992 Dependence on renal dialysis: Secondary | ICD-10-CM | POA: Diagnosis not present

## 2018-06-18 DIAGNOSIS — E1129 Type 2 diabetes mellitus with other diabetic kidney complication: Secondary | ICD-10-CM | POA: Diagnosis not present

## 2018-06-18 DIAGNOSIS — D631 Anemia in chronic kidney disease: Secondary | ICD-10-CM | POA: Diagnosis not present

## 2018-06-18 DIAGNOSIS — N2581 Secondary hyperparathyroidism of renal origin: Secondary | ICD-10-CM | POA: Diagnosis not present

## 2018-06-18 DIAGNOSIS — N186 End stage renal disease: Secondary | ICD-10-CM | POA: Diagnosis not present

## 2018-06-20 DIAGNOSIS — N186 End stage renal disease: Secondary | ICD-10-CM | POA: Diagnosis not present

## 2018-06-20 DIAGNOSIS — Z992 Dependence on renal dialysis: Secondary | ICD-10-CM | POA: Diagnosis not present

## 2018-06-20 DIAGNOSIS — E1129 Type 2 diabetes mellitus with other diabetic kidney complication: Secondary | ICD-10-CM | POA: Diagnosis not present

## 2018-06-21 DIAGNOSIS — Z992 Dependence on renal dialysis: Secondary | ICD-10-CM | POA: Diagnosis not present

## 2018-06-21 DIAGNOSIS — N2581 Secondary hyperparathyroidism of renal origin: Secondary | ICD-10-CM | POA: Diagnosis not present

## 2018-06-21 DIAGNOSIS — E1129 Type 2 diabetes mellitus with other diabetic kidney complication: Secondary | ICD-10-CM | POA: Diagnosis not present

## 2018-06-21 DIAGNOSIS — N186 End stage renal disease: Secondary | ICD-10-CM | POA: Diagnosis not present

## 2018-06-21 DIAGNOSIS — D631 Anemia in chronic kidney disease: Secondary | ICD-10-CM | POA: Diagnosis not present

## 2018-06-23 DIAGNOSIS — E1129 Type 2 diabetes mellitus with other diabetic kidney complication: Secondary | ICD-10-CM | POA: Diagnosis not present

## 2018-06-23 DIAGNOSIS — N2581 Secondary hyperparathyroidism of renal origin: Secondary | ICD-10-CM | POA: Diagnosis not present

## 2018-06-23 DIAGNOSIS — Z992 Dependence on renal dialysis: Secondary | ICD-10-CM | POA: Diagnosis not present

## 2018-06-23 DIAGNOSIS — N186 End stage renal disease: Secondary | ICD-10-CM | POA: Diagnosis not present

## 2018-06-23 DIAGNOSIS — D631 Anemia in chronic kidney disease: Secondary | ICD-10-CM | POA: Diagnosis not present

## 2018-06-25 DIAGNOSIS — N186 End stage renal disease: Secondary | ICD-10-CM | POA: Diagnosis not present

## 2018-06-25 DIAGNOSIS — Z992 Dependence on renal dialysis: Secondary | ICD-10-CM | POA: Diagnosis not present

## 2018-06-25 DIAGNOSIS — D631 Anemia in chronic kidney disease: Secondary | ICD-10-CM | POA: Diagnosis not present

## 2018-06-25 DIAGNOSIS — N2581 Secondary hyperparathyroidism of renal origin: Secondary | ICD-10-CM | POA: Diagnosis not present

## 2018-06-25 DIAGNOSIS — E1129 Type 2 diabetes mellitus with other diabetic kidney complication: Secondary | ICD-10-CM | POA: Diagnosis not present

## 2018-06-28 DIAGNOSIS — D631 Anemia in chronic kidney disease: Secondary | ICD-10-CM | POA: Diagnosis not present

## 2018-06-28 DIAGNOSIS — Z992 Dependence on renal dialysis: Secondary | ICD-10-CM | POA: Diagnosis not present

## 2018-06-28 DIAGNOSIS — N2581 Secondary hyperparathyroidism of renal origin: Secondary | ICD-10-CM | POA: Diagnosis not present

## 2018-06-28 DIAGNOSIS — E1129 Type 2 diabetes mellitus with other diabetic kidney complication: Secondary | ICD-10-CM | POA: Diagnosis not present

## 2018-06-28 DIAGNOSIS — N186 End stage renal disease: Secondary | ICD-10-CM | POA: Diagnosis not present

## 2018-06-30 DIAGNOSIS — N186 End stage renal disease: Secondary | ICD-10-CM | POA: Diagnosis not present

## 2018-06-30 DIAGNOSIS — Z992 Dependence on renal dialysis: Secondary | ICD-10-CM | POA: Diagnosis not present

## 2018-06-30 DIAGNOSIS — N2581 Secondary hyperparathyroidism of renal origin: Secondary | ICD-10-CM | POA: Diagnosis not present

## 2018-06-30 DIAGNOSIS — D631 Anemia in chronic kidney disease: Secondary | ICD-10-CM | POA: Diagnosis not present

## 2018-06-30 DIAGNOSIS — E1129 Type 2 diabetes mellitus with other diabetic kidney complication: Secondary | ICD-10-CM | POA: Diagnosis not present

## 2018-07-02 DIAGNOSIS — D631 Anemia in chronic kidney disease: Secondary | ICD-10-CM | POA: Diagnosis not present

## 2018-07-02 DIAGNOSIS — N2581 Secondary hyperparathyroidism of renal origin: Secondary | ICD-10-CM | POA: Diagnosis not present

## 2018-07-02 DIAGNOSIS — Z992 Dependence on renal dialysis: Secondary | ICD-10-CM | POA: Diagnosis not present

## 2018-07-02 DIAGNOSIS — E1129 Type 2 diabetes mellitus with other diabetic kidney complication: Secondary | ICD-10-CM | POA: Diagnosis not present

## 2018-07-02 DIAGNOSIS — N186 End stage renal disease: Secondary | ICD-10-CM | POA: Diagnosis not present

## 2018-07-05 DIAGNOSIS — Z992 Dependence on renal dialysis: Secondary | ICD-10-CM | POA: Diagnosis not present

## 2018-07-05 DIAGNOSIS — N2581 Secondary hyperparathyroidism of renal origin: Secondary | ICD-10-CM | POA: Diagnosis not present

## 2018-07-05 DIAGNOSIS — N186 End stage renal disease: Secondary | ICD-10-CM | POA: Diagnosis not present

## 2018-07-05 DIAGNOSIS — E1129 Type 2 diabetes mellitus with other diabetic kidney complication: Secondary | ICD-10-CM | POA: Diagnosis not present

## 2018-07-05 DIAGNOSIS — D631 Anemia in chronic kidney disease: Secondary | ICD-10-CM | POA: Diagnosis not present

## 2018-07-07 DIAGNOSIS — E1129 Type 2 diabetes mellitus with other diabetic kidney complication: Secondary | ICD-10-CM | POA: Diagnosis not present

## 2018-07-07 DIAGNOSIS — N186 End stage renal disease: Secondary | ICD-10-CM | POA: Diagnosis not present

## 2018-07-07 DIAGNOSIS — N2581 Secondary hyperparathyroidism of renal origin: Secondary | ICD-10-CM | POA: Diagnosis not present

## 2018-07-07 DIAGNOSIS — D631 Anemia in chronic kidney disease: Secondary | ICD-10-CM | POA: Diagnosis not present

## 2018-07-07 DIAGNOSIS — Z992 Dependence on renal dialysis: Secondary | ICD-10-CM | POA: Diagnosis not present

## 2018-07-09 DIAGNOSIS — Z992 Dependence on renal dialysis: Secondary | ICD-10-CM | POA: Diagnosis not present

## 2018-07-09 DIAGNOSIS — N2581 Secondary hyperparathyroidism of renal origin: Secondary | ICD-10-CM | POA: Diagnosis not present

## 2018-07-09 DIAGNOSIS — N186 End stage renal disease: Secondary | ICD-10-CM | POA: Diagnosis not present

## 2018-07-09 DIAGNOSIS — D631 Anemia in chronic kidney disease: Secondary | ICD-10-CM | POA: Diagnosis not present

## 2018-07-09 DIAGNOSIS — E1129 Type 2 diabetes mellitus with other diabetic kidney complication: Secondary | ICD-10-CM | POA: Diagnosis not present

## 2018-07-12 DIAGNOSIS — Z992 Dependence on renal dialysis: Secondary | ICD-10-CM | POA: Diagnosis not present

## 2018-07-12 DIAGNOSIS — N2581 Secondary hyperparathyroidism of renal origin: Secondary | ICD-10-CM | POA: Diagnosis not present

## 2018-07-12 DIAGNOSIS — N186 End stage renal disease: Secondary | ICD-10-CM | POA: Diagnosis not present

## 2018-07-12 DIAGNOSIS — E1129 Type 2 diabetes mellitus with other diabetic kidney complication: Secondary | ICD-10-CM | POA: Diagnosis not present

## 2018-07-12 DIAGNOSIS — D631 Anemia in chronic kidney disease: Secondary | ICD-10-CM | POA: Diagnosis not present

## 2018-07-14 DIAGNOSIS — N2581 Secondary hyperparathyroidism of renal origin: Secondary | ICD-10-CM | POA: Diagnosis not present

## 2018-07-14 DIAGNOSIS — D631 Anemia in chronic kidney disease: Secondary | ICD-10-CM | POA: Diagnosis not present

## 2018-07-14 DIAGNOSIS — Z992 Dependence on renal dialysis: Secondary | ICD-10-CM | POA: Diagnosis not present

## 2018-07-14 DIAGNOSIS — E1129 Type 2 diabetes mellitus with other diabetic kidney complication: Secondary | ICD-10-CM | POA: Diagnosis not present

## 2018-07-14 DIAGNOSIS — N186 End stage renal disease: Secondary | ICD-10-CM | POA: Diagnosis not present

## 2018-07-16 DIAGNOSIS — N2581 Secondary hyperparathyroidism of renal origin: Secondary | ICD-10-CM | POA: Diagnosis not present

## 2018-07-16 DIAGNOSIS — E1129 Type 2 diabetes mellitus with other diabetic kidney complication: Secondary | ICD-10-CM | POA: Diagnosis not present

## 2018-07-16 DIAGNOSIS — D631 Anemia in chronic kidney disease: Secondary | ICD-10-CM | POA: Diagnosis not present

## 2018-07-16 DIAGNOSIS — Z992 Dependence on renal dialysis: Secondary | ICD-10-CM | POA: Diagnosis not present

## 2018-07-16 DIAGNOSIS — N186 End stage renal disease: Secondary | ICD-10-CM | POA: Diagnosis not present

## 2018-07-19 DIAGNOSIS — D631 Anemia in chronic kidney disease: Secondary | ICD-10-CM | POA: Diagnosis not present

## 2018-07-19 DIAGNOSIS — N186 End stage renal disease: Secondary | ICD-10-CM | POA: Diagnosis not present

## 2018-07-19 DIAGNOSIS — N2581 Secondary hyperparathyroidism of renal origin: Secondary | ICD-10-CM | POA: Diagnosis not present

## 2018-07-19 DIAGNOSIS — Z992 Dependence on renal dialysis: Secondary | ICD-10-CM | POA: Diagnosis not present

## 2018-07-19 DIAGNOSIS — E1129 Type 2 diabetes mellitus with other diabetic kidney complication: Secondary | ICD-10-CM | POA: Diagnosis not present

## 2018-07-20 DIAGNOSIS — Z992 Dependence on renal dialysis: Secondary | ICD-10-CM | POA: Diagnosis not present

## 2018-07-20 DIAGNOSIS — N186 End stage renal disease: Secondary | ICD-10-CM | POA: Diagnosis not present

## 2018-07-20 DIAGNOSIS — E1129 Type 2 diabetes mellitus with other diabetic kidney complication: Secondary | ICD-10-CM | POA: Diagnosis not present

## 2018-07-21 DIAGNOSIS — N186 End stage renal disease: Secondary | ICD-10-CM | POA: Diagnosis not present

## 2018-07-21 DIAGNOSIS — N2581 Secondary hyperparathyroidism of renal origin: Secondary | ICD-10-CM | POA: Diagnosis not present

## 2018-07-21 DIAGNOSIS — Z992 Dependence on renal dialysis: Secondary | ICD-10-CM | POA: Diagnosis not present

## 2018-07-21 DIAGNOSIS — D631 Anemia in chronic kidney disease: Secondary | ICD-10-CM | POA: Diagnosis not present

## 2018-07-21 DIAGNOSIS — E1129 Type 2 diabetes mellitus with other diabetic kidney complication: Secondary | ICD-10-CM | POA: Diagnosis not present

## 2018-07-23 DIAGNOSIS — N186 End stage renal disease: Secondary | ICD-10-CM | POA: Diagnosis not present

## 2018-07-23 DIAGNOSIS — D631 Anemia in chronic kidney disease: Secondary | ICD-10-CM | POA: Diagnosis not present

## 2018-07-23 DIAGNOSIS — N2581 Secondary hyperparathyroidism of renal origin: Secondary | ICD-10-CM | POA: Diagnosis not present

## 2018-07-23 DIAGNOSIS — E1129 Type 2 diabetes mellitus with other diabetic kidney complication: Secondary | ICD-10-CM | POA: Diagnosis not present

## 2018-07-23 DIAGNOSIS — Z992 Dependence on renal dialysis: Secondary | ICD-10-CM | POA: Diagnosis not present

## 2018-07-26 DIAGNOSIS — E1129 Type 2 diabetes mellitus with other diabetic kidney complication: Secondary | ICD-10-CM | POA: Diagnosis not present

## 2018-07-26 DIAGNOSIS — Z992 Dependence on renal dialysis: Secondary | ICD-10-CM | POA: Diagnosis not present

## 2018-07-26 DIAGNOSIS — D631 Anemia in chronic kidney disease: Secondary | ICD-10-CM | POA: Diagnosis not present

## 2018-07-26 DIAGNOSIS — N186 End stage renal disease: Secondary | ICD-10-CM | POA: Diagnosis not present

## 2018-07-26 DIAGNOSIS — N2581 Secondary hyperparathyroidism of renal origin: Secondary | ICD-10-CM | POA: Diagnosis not present

## 2018-07-28 DIAGNOSIS — E1129 Type 2 diabetes mellitus with other diabetic kidney complication: Secondary | ICD-10-CM | POA: Diagnosis not present

## 2018-07-28 DIAGNOSIS — D631 Anemia in chronic kidney disease: Secondary | ICD-10-CM | POA: Diagnosis not present

## 2018-07-28 DIAGNOSIS — Z992 Dependence on renal dialysis: Secondary | ICD-10-CM | POA: Diagnosis not present

## 2018-07-28 DIAGNOSIS — N186 End stage renal disease: Secondary | ICD-10-CM | POA: Diagnosis not present

## 2018-07-28 DIAGNOSIS — N2581 Secondary hyperparathyroidism of renal origin: Secondary | ICD-10-CM | POA: Diagnosis not present

## 2018-07-30 DIAGNOSIS — D631 Anemia in chronic kidney disease: Secondary | ICD-10-CM | POA: Diagnosis not present

## 2018-07-30 DIAGNOSIS — N2581 Secondary hyperparathyroidism of renal origin: Secondary | ICD-10-CM | POA: Diagnosis not present

## 2018-07-30 DIAGNOSIS — E1129 Type 2 diabetes mellitus with other diabetic kidney complication: Secondary | ICD-10-CM | POA: Diagnosis not present

## 2018-07-30 DIAGNOSIS — Z992 Dependence on renal dialysis: Secondary | ICD-10-CM | POA: Diagnosis not present

## 2018-07-30 DIAGNOSIS — N186 End stage renal disease: Secondary | ICD-10-CM | POA: Diagnosis not present

## 2018-08-02 DIAGNOSIS — D631 Anemia in chronic kidney disease: Secondary | ICD-10-CM | POA: Diagnosis not present

## 2018-08-02 DIAGNOSIS — Z992 Dependence on renal dialysis: Secondary | ICD-10-CM | POA: Diagnosis not present

## 2018-08-02 DIAGNOSIS — N186 End stage renal disease: Secondary | ICD-10-CM | POA: Diagnosis not present

## 2018-08-02 DIAGNOSIS — E1129 Type 2 diabetes mellitus with other diabetic kidney complication: Secondary | ICD-10-CM | POA: Diagnosis not present

## 2018-08-02 DIAGNOSIS — N2581 Secondary hyperparathyroidism of renal origin: Secondary | ICD-10-CM | POA: Diagnosis not present

## 2018-08-04 DIAGNOSIS — N2581 Secondary hyperparathyroidism of renal origin: Secondary | ICD-10-CM | POA: Diagnosis not present

## 2018-08-04 DIAGNOSIS — E1129 Type 2 diabetes mellitus with other diabetic kidney complication: Secondary | ICD-10-CM | POA: Diagnosis not present

## 2018-08-04 DIAGNOSIS — N186 End stage renal disease: Secondary | ICD-10-CM | POA: Diagnosis not present

## 2018-08-04 DIAGNOSIS — D631 Anemia in chronic kidney disease: Secondary | ICD-10-CM | POA: Diagnosis not present

## 2018-08-04 DIAGNOSIS — Z992 Dependence on renal dialysis: Secondary | ICD-10-CM | POA: Diagnosis not present

## 2018-08-05 DIAGNOSIS — Z992 Dependence on renal dialysis: Secondary | ICD-10-CM | POA: Diagnosis not present

## 2018-08-05 DIAGNOSIS — D631 Anemia in chronic kidney disease: Secondary | ICD-10-CM | POA: Diagnosis not present

## 2018-08-05 DIAGNOSIS — N186 End stage renal disease: Secondary | ICD-10-CM | POA: Diagnosis not present

## 2018-08-05 DIAGNOSIS — E1129 Type 2 diabetes mellitus with other diabetic kidney complication: Secondary | ICD-10-CM | POA: Diagnosis not present

## 2018-08-05 DIAGNOSIS — N2581 Secondary hyperparathyroidism of renal origin: Secondary | ICD-10-CM | POA: Diagnosis not present

## 2018-08-09 DIAGNOSIS — D631 Anemia in chronic kidney disease: Secondary | ICD-10-CM | POA: Diagnosis not present

## 2018-08-09 DIAGNOSIS — N2581 Secondary hyperparathyroidism of renal origin: Secondary | ICD-10-CM | POA: Diagnosis not present

## 2018-08-09 DIAGNOSIS — E1129 Type 2 diabetes mellitus with other diabetic kidney complication: Secondary | ICD-10-CM | POA: Diagnosis not present

## 2018-08-09 DIAGNOSIS — Z992 Dependence on renal dialysis: Secondary | ICD-10-CM | POA: Diagnosis not present

## 2018-08-09 DIAGNOSIS — N186 End stage renal disease: Secondary | ICD-10-CM | POA: Diagnosis not present

## 2018-08-11 DIAGNOSIS — E1129 Type 2 diabetes mellitus with other diabetic kidney complication: Secondary | ICD-10-CM | POA: Diagnosis not present

## 2018-08-11 DIAGNOSIS — N186 End stage renal disease: Secondary | ICD-10-CM | POA: Diagnosis not present

## 2018-08-11 DIAGNOSIS — N2581 Secondary hyperparathyroidism of renal origin: Secondary | ICD-10-CM | POA: Diagnosis not present

## 2018-08-11 DIAGNOSIS — D631 Anemia in chronic kidney disease: Secondary | ICD-10-CM | POA: Diagnosis not present

## 2018-08-11 DIAGNOSIS — Z992 Dependence on renal dialysis: Secondary | ICD-10-CM | POA: Diagnosis not present

## 2018-08-13 DIAGNOSIS — N186 End stage renal disease: Secondary | ICD-10-CM | POA: Diagnosis not present

## 2018-08-13 DIAGNOSIS — N2581 Secondary hyperparathyroidism of renal origin: Secondary | ICD-10-CM | POA: Diagnosis not present

## 2018-08-13 DIAGNOSIS — D631 Anemia in chronic kidney disease: Secondary | ICD-10-CM | POA: Diagnosis not present

## 2018-08-13 DIAGNOSIS — Z992 Dependence on renal dialysis: Secondary | ICD-10-CM | POA: Diagnosis not present

## 2018-08-13 DIAGNOSIS — E1129 Type 2 diabetes mellitus with other diabetic kidney complication: Secondary | ICD-10-CM | POA: Diagnosis not present

## 2018-08-16 DIAGNOSIS — E1129 Type 2 diabetes mellitus with other diabetic kidney complication: Secondary | ICD-10-CM | POA: Diagnosis not present

## 2018-08-16 DIAGNOSIS — N2581 Secondary hyperparathyroidism of renal origin: Secondary | ICD-10-CM | POA: Diagnosis not present

## 2018-08-16 DIAGNOSIS — D631 Anemia in chronic kidney disease: Secondary | ICD-10-CM | POA: Diagnosis not present

## 2018-08-16 DIAGNOSIS — N186 End stage renal disease: Secondary | ICD-10-CM | POA: Diagnosis not present

## 2018-08-16 DIAGNOSIS — Z992 Dependence on renal dialysis: Secondary | ICD-10-CM | POA: Diagnosis not present

## 2018-08-18 DIAGNOSIS — N186 End stage renal disease: Secondary | ICD-10-CM | POA: Diagnosis not present

## 2018-08-18 DIAGNOSIS — E1129 Type 2 diabetes mellitus with other diabetic kidney complication: Secondary | ICD-10-CM | POA: Diagnosis not present

## 2018-08-18 DIAGNOSIS — Z992 Dependence on renal dialysis: Secondary | ICD-10-CM | POA: Diagnosis not present

## 2018-08-18 DIAGNOSIS — N2581 Secondary hyperparathyroidism of renal origin: Secondary | ICD-10-CM | POA: Diagnosis not present

## 2018-08-18 DIAGNOSIS — D631 Anemia in chronic kidney disease: Secondary | ICD-10-CM | POA: Diagnosis not present

## 2018-08-20 DIAGNOSIS — N2581 Secondary hyperparathyroidism of renal origin: Secondary | ICD-10-CM | POA: Diagnosis not present

## 2018-08-20 DIAGNOSIS — D631 Anemia in chronic kidney disease: Secondary | ICD-10-CM | POA: Diagnosis not present

## 2018-08-20 DIAGNOSIS — N186 End stage renal disease: Secondary | ICD-10-CM | POA: Diagnosis not present

## 2018-08-20 DIAGNOSIS — E1129 Type 2 diabetes mellitus with other diabetic kidney complication: Secondary | ICD-10-CM | POA: Diagnosis not present

## 2018-08-20 DIAGNOSIS — Z992 Dependence on renal dialysis: Secondary | ICD-10-CM | POA: Diagnosis not present

## 2018-08-23 DIAGNOSIS — E1129 Type 2 diabetes mellitus with other diabetic kidney complication: Secondary | ICD-10-CM | POA: Diagnosis not present

## 2018-08-23 DIAGNOSIS — N2581 Secondary hyperparathyroidism of renal origin: Secondary | ICD-10-CM | POA: Diagnosis not present

## 2018-08-23 DIAGNOSIS — N186 End stage renal disease: Secondary | ICD-10-CM | POA: Diagnosis not present

## 2018-08-23 DIAGNOSIS — D631 Anemia in chronic kidney disease: Secondary | ICD-10-CM | POA: Diagnosis not present

## 2018-08-23 DIAGNOSIS — Z992 Dependence on renal dialysis: Secondary | ICD-10-CM | POA: Diagnosis not present

## 2018-08-25 DIAGNOSIS — D631 Anemia in chronic kidney disease: Secondary | ICD-10-CM | POA: Diagnosis not present

## 2018-08-25 DIAGNOSIS — E1129 Type 2 diabetes mellitus with other diabetic kidney complication: Secondary | ICD-10-CM | POA: Diagnosis not present

## 2018-08-25 DIAGNOSIS — Z992 Dependence on renal dialysis: Secondary | ICD-10-CM | POA: Diagnosis not present

## 2018-08-25 DIAGNOSIS — N2581 Secondary hyperparathyroidism of renal origin: Secondary | ICD-10-CM | POA: Diagnosis not present

## 2018-08-25 DIAGNOSIS — N186 End stage renal disease: Secondary | ICD-10-CM | POA: Diagnosis not present

## 2018-08-26 ENCOUNTER — Ambulatory Visit
Admission: RE | Admit: 2018-08-26 | Discharge: 2018-08-26 | Disposition: A | Discharge status: Home / Self Care | Payer: Medicare Other | Source: Ambulatory Visit | Attending: Family Medicine | Admitting: Family Medicine

## 2018-08-26 ENCOUNTER — Other Ambulatory Visit: Payer: Self-pay | Admitting: Family Medicine

## 2018-08-26 DIAGNOSIS — M19012 Primary osteoarthritis, left shoulder: Secondary | ICD-10-CM | POA: Diagnosis not present

## 2018-08-26 DIAGNOSIS — M25511 Pain in right shoulder: Secondary | ICD-10-CM

## 2018-08-26 DIAGNOSIS — M25512 Pain in left shoulder: Principal | ICD-10-CM

## 2018-08-26 DIAGNOSIS — M19011 Primary osteoarthritis, right shoulder: Secondary | ICD-10-CM | POA: Diagnosis not present

## 2018-08-27 DIAGNOSIS — D631 Anemia in chronic kidney disease: Secondary | ICD-10-CM | POA: Diagnosis not present

## 2018-08-27 DIAGNOSIS — N2581 Secondary hyperparathyroidism of renal origin: Secondary | ICD-10-CM | POA: Diagnosis not present

## 2018-08-27 DIAGNOSIS — E1129 Type 2 diabetes mellitus with other diabetic kidney complication: Secondary | ICD-10-CM | POA: Diagnosis not present

## 2018-08-27 DIAGNOSIS — Z992 Dependence on renal dialysis: Secondary | ICD-10-CM | POA: Diagnosis not present

## 2018-08-27 DIAGNOSIS — N186 End stage renal disease: Secondary | ICD-10-CM | POA: Diagnosis not present

## 2018-08-30 DIAGNOSIS — N186 End stage renal disease: Secondary | ICD-10-CM | POA: Diagnosis not present

## 2018-08-30 DIAGNOSIS — N2581 Secondary hyperparathyroidism of renal origin: Secondary | ICD-10-CM | POA: Diagnosis not present

## 2018-08-30 DIAGNOSIS — Z992 Dependence on renal dialysis: Secondary | ICD-10-CM | POA: Diagnosis not present

## 2018-08-30 DIAGNOSIS — E1129 Type 2 diabetes mellitus with other diabetic kidney complication: Secondary | ICD-10-CM | POA: Diagnosis not present

## 2018-08-30 DIAGNOSIS — D631 Anemia in chronic kidney disease: Secondary | ICD-10-CM | POA: Diagnosis not present

## 2018-09-01 DIAGNOSIS — Z992 Dependence on renal dialysis: Secondary | ICD-10-CM | POA: Diagnosis not present

## 2018-09-01 DIAGNOSIS — E1129 Type 2 diabetes mellitus with other diabetic kidney complication: Secondary | ICD-10-CM | POA: Diagnosis not present

## 2018-09-01 DIAGNOSIS — N2581 Secondary hyperparathyroidism of renal origin: Secondary | ICD-10-CM | POA: Diagnosis not present

## 2018-09-01 DIAGNOSIS — D631 Anemia in chronic kidney disease: Secondary | ICD-10-CM | POA: Diagnosis not present

## 2018-09-01 DIAGNOSIS — N186 End stage renal disease: Secondary | ICD-10-CM | POA: Diagnosis not present

## 2018-09-03 DIAGNOSIS — D631 Anemia in chronic kidney disease: Secondary | ICD-10-CM | POA: Diagnosis not present

## 2018-09-03 DIAGNOSIS — Z992 Dependence on renal dialysis: Secondary | ICD-10-CM | POA: Diagnosis not present

## 2018-09-03 DIAGNOSIS — N186 End stage renal disease: Secondary | ICD-10-CM | POA: Diagnosis not present

## 2018-09-03 DIAGNOSIS — E1129 Type 2 diabetes mellitus with other diabetic kidney complication: Secondary | ICD-10-CM | POA: Diagnosis not present

## 2018-09-03 DIAGNOSIS — N2581 Secondary hyperparathyroidism of renal origin: Secondary | ICD-10-CM | POA: Diagnosis not present

## 2018-09-06 DIAGNOSIS — D631 Anemia in chronic kidney disease: Secondary | ICD-10-CM | POA: Diagnosis not present

## 2018-09-06 DIAGNOSIS — N186 End stage renal disease: Secondary | ICD-10-CM | POA: Diagnosis not present

## 2018-09-06 DIAGNOSIS — E1129 Type 2 diabetes mellitus with other diabetic kidney complication: Secondary | ICD-10-CM | POA: Diagnosis not present

## 2018-09-06 DIAGNOSIS — N2581 Secondary hyperparathyroidism of renal origin: Secondary | ICD-10-CM | POA: Diagnosis not present

## 2018-09-06 DIAGNOSIS — Z992 Dependence on renal dialysis: Secondary | ICD-10-CM | POA: Diagnosis not present

## 2018-09-08 DIAGNOSIS — Z992 Dependence on renal dialysis: Secondary | ICD-10-CM | POA: Diagnosis not present

## 2018-09-08 DIAGNOSIS — E1129 Type 2 diabetes mellitus with other diabetic kidney complication: Secondary | ICD-10-CM | POA: Diagnosis not present

## 2018-09-08 DIAGNOSIS — N186 End stage renal disease: Secondary | ICD-10-CM | POA: Diagnosis not present

## 2018-09-08 DIAGNOSIS — N2581 Secondary hyperparathyroidism of renal origin: Secondary | ICD-10-CM | POA: Diagnosis not present

## 2018-09-08 DIAGNOSIS — D631 Anemia in chronic kidney disease: Secondary | ICD-10-CM | POA: Diagnosis not present

## 2018-09-10 DIAGNOSIS — E1129 Type 2 diabetes mellitus with other diabetic kidney complication: Secondary | ICD-10-CM | POA: Diagnosis not present

## 2018-09-10 DIAGNOSIS — N186 End stage renal disease: Secondary | ICD-10-CM | POA: Diagnosis not present

## 2018-09-10 DIAGNOSIS — Z992 Dependence on renal dialysis: Secondary | ICD-10-CM | POA: Diagnosis not present

## 2018-09-10 DIAGNOSIS — N2581 Secondary hyperparathyroidism of renal origin: Secondary | ICD-10-CM | POA: Diagnosis not present

## 2018-09-10 DIAGNOSIS — D631 Anemia in chronic kidney disease: Secondary | ICD-10-CM | POA: Diagnosis not present

## 2018-09-12 DIAGNOSIS — Z992 Dependence on renal dialysis: Secondary | ICD-10-CM | POA: Diagnosis not present

## 2018-09-12 DIAGNOSIS — N186 End stage renal disease: Secondary | ICD-10-CM | POA: Diagnosis not present

## 2018-09-12 DIAGNOSIS — D631 Anemia in chronic kidney disease: Secondary | ICD-10-CM | POA: Diagnosis not present

## 2018-09-12 DIAGNOSIS — E1129 Type 2 diabetes mellitus with other diabetic kidney complication: Secondary | ICD-10-CM | POA: Diagnosis not present

## 2018-09-12 DIAGNOSIS — N2581 Secondary hyperparathyroidism of renal origin: Secondary | ICD-10-CM | POA: Diagnosis not present

## 2018-09-14 DIAGNOSIS — D631 Anemia in chronic kidney disease: Secondary | ICD-10-CM | POA: Diagnosis not present

## 2018-09-14 DIAGNOSIS — Z992 Dependence on renal dialysis: Secondary | ICD-10-CM | POA: Diagnosis not present

## 2018-09-14 DIAGNOSIS — N186 End stage renal disease: Secondary | ICD-10-CM | POA: Diagnosis not present

## 2018-09-14 DIAGNOSIS — N2581 Secondary hyperparathyroidism of renal origin: Secondary | ICD-10-CM | POA: Diagnosis not present

## 2018-09-14 DIAGNOSIS — E1129 Type 2 diabetes mellitus with other diabetic kidney complication: Secondary | ICD-10-CM | POA: Diagnosis not present

## 2018-09-17 DIAGNOSIS — N2581 Secondary hyperparathyroidism of renal origin: Secondary | ICD-10-CM | POA: Diagnosis not present

## 2018-09-17 DIAGNOSIS — D631 Anemia in chronic kidney disease: Secondary | ICD-10-CM | POA: Diagnosis not present

## 2018-09-17 DIAGNOSIS — E1129 Type 2 diabetes mellitus with other diabetic kidney complication: Secondary | ICD-10-CM | POA: Diagnosis not present

## 2018-09-17 DIAGNOSIS — N186 End stage renal disease: Secondary | ICD-10-CM | POA: Diagnosis not present

## 2018-09-17 DIAGNOSIS — Z992 Dependence on renal dialysis: Secondary | ICD-10-CM | POA: Diagnosis not present

## 2018-09-19 DIAGNOSIS — Z992 Dependence on renal dialysis: Secondary | ICD-10-CM | POA: Diagnosis not present

## 2018-09-19 DIAGNOSIS — E1129 Type 2 diabetes mellitus with other diabetic kidney complication: Secondary | ICD-10-CM | POA: Diagnosis not present

## 2018-09-19 DIAGNOSIS — N186 End stage renal disease: Secondary | ICD-10-CM | POA: Diagnosis not present

## 2018-09-20 DIAGNOSIS — D631 Anemia in chronic kidney disease: Secondary | ICD-10-CM | POA: Diagnosis not present

## 2018-09-20 DIAGNOSIS — N186 End stage renal disease: Secondary | ICD-10-CM | POA: Diagnosis not present

## 2018-09-20 DIAGNOSIS — N2581 Secondary hyperparathyroidism of renal origin: Secondary | ICD-10-CM | POA: Diagnosis not present

## 2018-09-20 DIAGNOSIS — E1129 Type 2 diabetes mellitus with other diabetic kidney complication: Secondary | ICD-10-CM | POA: Diagnosis not present

## 2018-09-20 DIAGNOSIS — Z992 Dependence on renal dialysis: Secondary | ICD-10-CM | POA: Diagnosis not present

## 2018-09-22 DIAGNOSIS — D631 Anemia in chronic kidney disease: Secondary | ICD-10-CM | POA: Diagnosis not present

## 2018-09-22 DIAGNOSIS — Z992 Dependence on renal dialysis: Secondary | ICD-10-CM | POA: Diagnosis not present

## 2018-09-22 DIAGNOSIS — N2581 Secondary hyperparathyroidism of renal origin: Secondary | ICD-10-CM | POA: Diagnosis not present

## 2018-09-22 DIAGNOSIS — E1129 Type 2 diabetes mellitus with other diabetic kidney complication: Secondary | ICD-10-CM | POA: Diagnosis not present

## 2018-09-22 DIAGNOSIS — N186 End stage renal disease: Secondary | ICD-10-CM | POA: Diagnosis not present

## 2018-09-24 DIAGNOSIS — N186 End stage renal disease: Secondary | ICD-10-CM | POA: Diagnosis not present

## 2018-09-24 DIAGNOSIS — Z992 Dependence on renal dialysis: Secondary | ICD-10-CM | POA: Diagnosis not present

## 2018-09-24 DIAGNOSIS — E1129 Type 2 diabetes mellitus with other diabetic kidney complication: Secondary | ICD-10-CM | POA: Diagnosis not present

## 2018-09-24 DIAGNOSIS — D631 Anemia in chronic kidney disease: Secondary | ICD-10-CM | POA: Diagnosis not present

## 2018-09-24 DIAGNOSIS — N2581 Secondary hyperparathyroidism of renal origin: Secondary | ICD-10-CM | POA: Diagnosis not present

## 2018-09-27 DIAGNOSIS — D631 Anemia in chronic kidney disease: Secondary | ICD-10-CM | POA: Diagnosis not present

## 2018-09-27 DIAGNOSIS — E1129 Type 2 diabetes mellitus with other diabetic kidney complication: Secondary | ICD-10-CM | POA: Diagnosis not present

## 2018-09-27 DIAGNOSIS — Z992 Dependence on renal dialysis: Secondary | ICD-10-CM | POA: Diagnosis not present

## 2018-09-27 DIAGNOSIS — N2581 Secondary hyperparathyroidism of renal origin: Secondary | ICD-10-CM | POA: Diagnosis not present

## 2018-09-27 DIAGNOSIS — N186 End stage renal disease: Secondary | ICD-10-CM | POA: Diagnosis not present

## 2018-09-29 DIAGNOSIS — N186 End stage renal disease: Secondary | ICD-10-CM | POA: Diagnosis not present

## 2018-09-29 DIAGNOSIS — Z992 Dependence on renal dialysis: Secondary | ICD-10-CM | POA: Diagnosis not present

## 2018-09-29 DIAGNOSIS — D631 Anemia in chronic kidney disease: Secondary | ICD-10-CM | POA: Diagnosis not present

## 2018-09-29 DIAGNOSIS — N2581 Secondary hyperparathyroidism of renal origin: Secondary | ICD-10-CM | POA: Diagnosis not present

## 2018-09-29 DIAGNOSIS — E1129 Type 2 diabetes mellitus with other diabetic kidney complication: Secondary | ICD-10-CM | POA: Diagnosis not present

## 2018-10-01 DIAGNOSIS — D631 Anemia in chronic kidney disease: Secondary | ICD-10-CM | POA: Diagnosis not present

## 2018-10-01 DIAGNOSIS — E1129 Type 2 diabetes mellitus with other diabetic kidney complication: Secondary | ICD-10-CM | POA: Diagnosis not present

## 2018-10-01 DIAGNOSIS — N186 End stage renal disease: Secondary | ICD-10-CM | POA: Diagnosis not present

## 2018-10-01 DIAGNOSIS — N2581 Secondary hyperparathyroidism of renal origin: Secondary | ICD-10-CM | POA: Diagnosis not present

## 2018-10-01 DIAGNOSIS — Z992 Dependence on renal dialysis: Secondary | ICD-10-CM | POA: Diagnosis not present

## 2018-10-04 DIAGNOSIS — N186 End stage renal disease: Secondary | ICD-10-CM | POA: Diagnosis not present

## 2018-10-04 DIAGNOSIS — N2581 Secondary hyperparathyroidism of renal origin: Secondary | ICD-10-CM | POA: Diagnosis not present

## 2018-10-04 DIAGNOSIS — D631 Anemia in chronic kidney disease: Secondary | ICD-10-CM | POA: Diagnosis not present

## 2018-10-04 DIAGNOSIS — Z992 Dependence on renal dialysis: Secondary | ICD-10-CM | POA: Diagnosis not present

## 2018-10-04 DIAGNOSIS — E1129 Type 2 diabetes mellitus with other diabetic kidney complication: Secondary | ICD-10-CM | POA: Diagnosis not present

## 2018-10-06 DIAGNOSIS — N2581 Secondary hyperparathyroidism of renal origin: Secondary | ICD-10-CM | POA: Diagnosis not present

## 2018-10-06 DIAGNOSIS — Z992 Dependence on renal dialysis: Secondary | ICD-10-CM | POA: Diagnosis not present

## 2018-10-06 DIAGNOSIS — E1129 Type 2 diabetes mellitus with other diabetic kidney complication: Secondary | ICD-10-CM | POA: Diagnosis not present

## 2018-10-06 DIAGNOSIS — N186 End stage renal disease: Secondary | ICD-10-CM | POA: Diagnosis not present

## 2018-10-06 DIAGNOSIS — D631 Anemia in chronic kidney disease: Secondary | ICD-10-CM | POA: Diagnosis not present

## 2018-10-08 DIAGNOSIS — D631 Anemia in chronic kidney disease: Secondary | ICD-10-CM | POA: Diagnosis not present

## 2018-10-08 DIAGNOSIS — N186 End stage renal disease: Secondary | ICD-10-CM | POA: Diagnosis not present

## 2018-10-08 DIAGNOSIS — E1129 Type 2 diabetes mellitus with other diabetic kidney complication: Secondary | ICD-10-CM | POA: Diagnosis not present

## 2018-10-08 DIAGNOSIS — Z992 Dependence on renal dialysis: Secondary | ICD-10-CM | POA: Diagnosis not present

## 2018-10-08 DIAGNOSIS — N2581 Secondary hyperparathyroidism of renal origin: Secondary | ICD-10-CM | POA: Diagnosis not present

## 2018-10-12 DIAGNOSIS — D631 Anemia in chronic kidney disease: Secondary | ICD-10-CM | POA: Diagnosis not present

## 2018-10-12 DIAGNOSIS — N186 End stage renal disease: Secondary | ICD-10-CM | POA: Diagnosis not present

## 2018-10-12 DIAGNOSIS — N2581 Secondary hyperparathyroidism of renal origin: Secondary | ICD-10-CM | POA: Diagnosis not present

## 2018-10-12 DIAGNOSIS — E1129 Type 2 diabetes mellitus with other diabetic kidney complication: Secondary | ICD-10-CM | POA: Diagnosis not present

## 2018-10-12 DIAGNOSIS — Z992 Dependence on renal dialysis: Secondary | ICD-10-CM | POA: Diagnosis not present

## 2018-10-15 DIAGNOSIS — N2581 Secondary hyperparathyroidism of renal origin: Secondary | ICD-10-CM | POA: Diagnosis not present

## 2018-10-15 DIAGNOSIS — E1129 Type 2 diabetes mellitus with other diabetic kidney complication: Secondary | ICD-10-CM | POA: Diagnosis not present

## 2018-10-15 DIAGNOSIS — N186 End stage renal disease: Secondary | ICD-10-CM | POA: Diagnosis not present

## 2018-10-15 DIAGNOSIS — D631 Anemia in chronic kidney disease: Secondary | ICD-10-CM | POA: Diagnosis not present

## 2018-10-15 DIAGNOSIS — Z992 Dependence on renal dialysis: Secondary | ICD-10-CM | POA: Diagnosis not present

## 2018-10-17 DIAGNOSIS — Z992 Dependence on renal dialysis: Secondary | ICD-10-CM | POA: Diagnosis not present

## 2018-10-17 DIAGNOSIS — N186 End stage renal disease: Secondary | ICD-10-CM | POA: Diagnosis not present

## 2018-10-17 DIAGNOSIS — E1129 Type 2 diabetes mellitus with other diabetic kidney complication: Secondary | ICD-10-CM | POA: Diagnosis not present

## 2018-10-17 DIAGNOSIS — N2581 Secondary hyperparathyroidism of renal origin: Secondary | ICD-10-CM | POA: Diagnosis not present

## 2018-10-17 DIAGNOSIS — D631 Anemia in chronic kidney disease: Secondary | ICD-10-CM | POA: Diagnosis not present

## 2018-10-19 DIAGNOSIS — N2581 Secondary hyperparathyroidism of renal origin: Secondary | ICD-10-CM | POA: Diagnosis not present

## 2018-10-19 DIAGNOSIS — E1129 Type 2 diabetes mellitus with other diabetic kidney complication: Secondary | ICD-10-CM | POA: Diagnosis not present

## 2018-10-19 DIAGNOSIS — Z992 Dependence on renal dialysis: Secondary | ICD-10-CM | POA: Diagnosis not present

## 2018-10-19 DIAGNOSIS — D631 Anemia in chronic kidney disease: Secondary | ICD-10-CM | POA: Diagnosis not present

## 2018-10-19 DIAGNOSIS — N186 End stage renal disease: Secondary | ICD-10-CM | POA: Diagnosis not present

## 2018-10-20 DIAGNOSIS — E1129 Type 2 diabetes mellitus with other diabetic kidney complication: Secondary | ICD-10-CM | POA: Diagnosis not present

## 2018-10-20 DIAGNOSIS — Z992 Dependence on renal dialysis: Secondary | ICD-10-CM | POA: Diagnosis not present

## 2018-10-20 DIAGNOSIS — N186 End stage renal disease: Secondary | ICD-10-CM | POA: Diagnosis not present

## 2018-10-22 DIAGNOSIS — N2581 Secondary hyperparathyroidism of renal origin: Secondary | ICD-10-CM | POA: Diagnosis not present

## 2018-10-22 DIAGNOSIS — D631 Anemia in chronic kidney disease: Secondary | ICD-10-CM | POA: Diagnosis not present

## 2018-10-22 DIAGNOSIS — E1129 Type 2 diabetes mellitus with other diabetic kidney complication: Secondary | ICD-10-CM | POA: Diagnosis not present

## 2018-10-22 DIAGNOSIS — N186 End stage renal disease: Secondary | ICD-10-CM | POA: Diagnosis not present

## 2018-10-22 DIAGNOSIS — D509 Iron deficiency anemia, unspecified: Secondary | ICD-10-CM | POA: Diagnosis not present

## 2018-10-22 DIAGNOSIS — Z992 Dependence on renal dialysis: Secondary | ICD-10-CM | POA: Diagnosis not present

## 2018-10-25 DIAGNOSIS — N186 End stage renal disease: Secondary | ICD-10-CM | POA: Diagnosis not present

## 2018-10-25 DIAGNOSIS — D631 Anemia in chronic kidney disease: Secondary | ICD-10-CM | POA: Diagnosis not present

## 2018-10-25 DIAGNOSIS — N2581 Secondary hyperparathyroidism of renal origin: Secondary | ICD-10-CM | POA: Diagnosis not present

## 2018-10-25 DIAGNOSIS — Z992 Dependence on renal dialysis: Secondary | ICD-10-CM | POA: Diagnosis not present

## 2018-10-25 DIAGNOSIS — E1129 Type 2 diabetes mellitus with other diabetic kidney complication: Secondary | ICD-10-CM | POA: Diagnosis not present

## 2018-10-25 DIAGNOSIS — D509 Iron deficiency anemia, unspecified: Secondary | ICD-10-CM | POA: Diagnosis not present

## 2018-10-27 DIAGNOSIS — D631 Anemia in chronic kidney disease: Secondary | ICD-10-CM | POA: Diagnosis not present

## 2018-10-27 DIAGNOSIS — D509 Iron deficiency anemia, unspecified: Secondary | ICD-10-CM | POA: Diagnosis not present

## 2018-10-27 DIAGNOSIS — E1129 Type 2 diabetes mellitus with other diabetic kidney complication: Secondary | ICD-10-CM | POA: Diagnosis not present

## 2018-10-27 DIAGNOSIS — N2581 Secondary hyperparathyroidism of renal origin: Secondary | ICD-10-CM | POA: Diagnosis not present

## 2018-10-27 DIAGNOSIS — Z992 Dependence on renal dialysis: Secondary | ICD-10-CM | POA: Diagnosis not present

## 2018-10-27 DIAGNOSIS — N186 End stage renal disease: Secondary | ICD-10-CM | POA: Diagnosis not present

## 2018-10-29 DIAGNOSIS — D631 Anemia in chronic kidney disease: Secondary | ICD-10-CM | POA: Diagnosis not present

## 2018-10-29 DIAGNOSIS — D509 Iron deficiency anemia, unspecified: Secondary | ICD-10-CM | POA: Diagnosis not present

## 2018-10-29 DIAGNOSIS — N186 End stage renal disease: Secondary | ICD-10-CM | POA: Diagnosis not present

## 2018-10-29 DIAGNOSIS — Z992 Dependence on renal dialysis: Secondary | ICD-10-CM | POA: Diagnosis not present

## 2018-10-29 DIAGNOSIS — N2581 Secondary hyperparathyroidism of renal origin: Secondary | ICD-10-CM | POA: Diagnosis not present

## 2018-10-29 DIAGNOSIS — E1129 Type 2 diabetes mellitus with other diabetic kidney complication: Secondary | ICD-10-CM | POA: Diagnosis not present

## 2018-11-01 DIAGNOSIS — N186 End stage renal disease: Secondary | ICD-10-CM | POA: Diagnosis not present

## 2018-11-01 DIAGNOSIS — D631 Anemia in chronic kidney disease: Secondary | ICD-10-CM | POA: Diagnosis not present

## 2018-11-01 DIAGNOSIS — E1129 Type 2 diabetes mellitus with other diabetic kidney complication: Secondary | ICD-10-CM | POA: Diagnosis not present

## 2018-11-01 DIAGNOSIS — D509 Iron deficiency anemia, unspecified: Secondary | ICD-10-CM | POA: Diagnosis not present

## 2018-11-01 DIAGNOSIS — N2581 Secondary hyperparathyroidism of renal origin: Secondary | ICD-10-CM | POA: Diagnosis not present

## 2018-11-01 DIAGNOSIS — Z992 Dependence on renal dialysis: Secondary | ICD-10-CM | POA: Diagnosis not present

## 2018-11-03 DIAGNOSIS — E1129 Type 2 diabetes mellitus with other diabetic kidney complication: Secondary | ICD-10-CM | POA: Diagnosis not present

## 2018-11-03 DIAGNOSIS — D509 Iron deficiency anemia, unspecified: Secondary | ICD-10-CM | POA: Diagnosis not present

## 2018-11-03 DIAGNOSIS — D631 Anemia in chronic kidney disease: Secondary | ICD-10-CM | POA: Diagnosis not present

## 2018-11-03 DIAGNOSIS — N186 End stage renal disease: Secondary | ICD-10-CM | POA: Diagnosis not present

## 2018-11-03 DIAGNOSIS — Z992 Dependence on renal dialysis: Secondary | ICD-10-CM | POA: Diagnosis not present

## 2018-11-03 DIAGNOSIS — N2581 Secondary hyperparathyroidism of renal origin: Secondary | ICD-10-CM | POA: Diagnosis not present

## 2018-11-05 DIAGNOSIS — N186 End stage renal disease: Secondary | ICD-10-CM | POA: Diagnosis not present

## 2018-11-05 DIAGNOSIS — Z992 Dependence on renal dialysis: Secondary | ICD-10-CM | POA: Diagnosis not present

## 2018-11-05 DIAGNOSIS — E1129 Type 2 diabetes mellitus with other diabetic kidney complication: Secondary | ICD-10-CM | POA: Diagnosis not present

## 2018-11-05 DIAGNOSIS — N2581 Secondary hyperparathyroidism of renal origin: Secondary | ICD-10-CM | POA: Diagnosis not present

## 2018-11-05 DIAGNOSIS — D509 Iron deficiency anemia, unspecified: Secondary | ICD-10-CM | POA: Diagnosis not present

## 2018-11-05 DIAGNOSIS — D631 Anemia in chronic kidney disease: Secondary | ICD-10-CM | POA: Diagnosis not present

## 2018-11-08 DIAGNOSIS — D509 Iron deficiency anemia, unspecified: Secondary | ICD-10-CM | POA: Diagnosis not present

## 2018-11-08 DIAGNOSIS — D631 Anemia in chronic kidney disease: Secondary | ICD-10-CM | POA: Diagnosis not present

## 2018-11-08 DIAGNOSIS — N2581 Secondary hyperparathyroidism of renal origin: Secondary | ICD-10-CM | POA: Diagnosis not present

## 2018-11-08 DIAGNOSIS — E1129 Type 2 diabetes mellitus with other diabetic kidney complication: Secondary | ICD-10-CM | POA: Diagnosis not present

## 2018-11-08 DIAGNOSIS — N186 End stage renal disease: Secondary | ICD-10-CM | POA: Diagnosis not present

## 2018-11-08 DIAGNOSIS — Z992 Dependence on renal dialysis: Secondary | ICD-10-CM | POA: Diagnosis not present

## 2018-11-10 DIAGNOSIS — D631 Anemia in chronic kidney disease: Secondary | ICD-10-CM | POA: Diagnosis not present

## 2018-11-10 DIAGNOSIS — Z992 Dependence on renal dialysis: Secondary | ICD-10-CM | POA: Diagnosis not present

## 2018-11-10 DIAGNOSIS — E1129 Type 2 diabetes mellitus with other diabetic kidney complication: Secondary | ICD-10-CM | POA: Diagnosis not present

## 2018-11-10 DIAGNOSIS — D509 Iron deficiency anemia, unspecified: Secondary | ICD-10-CM | POA: Diagnosis not present

## 2018-11-10 DIAGNOSIS — N2581 Secondary hyperparathyroidism of renal origin: Secondary | ICD-10-CM | POA: Diagnosis not present

## 2018-11-10 DIAGNOSIS — N186 End stage renal disease: Secondary | ICD-10-CM | POA: Diagnosis not present

## 2018-11-12 DIAGNOSIS — D631 Anemia in chronic kidney disease: Secondary | ICD-10-CM | POA: Diagnosis not present

## 2018-11-12 DIAGNOSIS — N186 End stage renal disease: Secondary | ICD-10-CM | POA: Diagnosis not present

## 2018-11-12 DIAGNOSIS — Z992 Dependence on renal dialysis: Secondary | ICD-10-CM | POA: Diagnosis not present

## 2018-11-12 DIAGNOSIS — D509 Iron deficiency anemia, unspecified: Secondary | ICD-10-CM | POA: Diagnosis not present

## 2018-11-12 DIAGNOSIS — N2581 Secondary hyperparathyroidism of renal origin: Secondary | ICD-10-CM | POA: Diagnosis not present

## 2018-11-12 DIAGNOSIS — E1129 Type 2 diabetes mellitus with other diabetic kidney complication: Secondary | ICD-10-CM | POA: Diagnosis not present

## 2018-11-15 DIAGNOSIS — D631 Anemia in chronic kidney disease: Secondary | ICD-10-CM | POA: Diagnosis not present

## 2018-11-15 DIAGNOSIS — D509 Iron deficiency anemia, unspecified: Secondary | ICD-10-CM | POA: Diagnosis not present

## 2018-11-15 DIAGNOSIS — N186 End stage renal disease: Secondary | ICD-10-CM | POA: Diagnosis not present

## 2018-11-15 DIAGNOSIS — E1129 Type 2 diabetes mellitus with other diabetic kidney complication: Secondary | ICD-10-CM | POA: Diagnosis not present

## 2018-11-15 DIAGNOSIS — N2581 Secondary hyperparathyroidism of renal origin: Secondary | ICD-10-CM | POA: Diagnosis not present

## 2018-11-15 DIAGNOSIS — Z992 Dependence on renal dialysis: Secondary | ICD-10-CM | POA: Diagnosis not present

## 2018-11-17 DIAGNOSIS — D631 Anemia in chronic kidney disease: Secondary | ICD-10-CM | POA: Diagnosis not present

## 2018-11-17 DIAGNOSIS — E1129 Type 2 diabetes mellitus with other diabetic kidney complication: Secondary | ICD-10-CM | POA: Diagnosis not present

## 2018-11-17 DIAGNOSIS — N186 End stage renal disease: Secondary | ICD-10-CM | POA: Diagnosis not present

## 2018-11-17 DIAGNOSIS — D509 Iron deficiency anemia, unspecified: Secondary | ICD-10-CM | POA: Diagnosis not present

## 2018-11-17 DIAGNOSIS — N2581 Secondary hyperparathyroidism of renal origin: Secondary | ICD-10-CM | POA: Diagnosis not present

## 2018-11-17 DIAGNOSIS — Z992 Dependence on renal dialysis: Secondary | ICD-10-CM | POA: Diagnosis not present

## 2018-11-19 DIAGNOSIS — D509 Iron deficiency anemia, unspecified: Secondary | ICD-10-CM | POA: Diagnosis not present

## 2018-11-19 DIAGNOSIS — E1129 Type 2 diabetes mellitus with other diabetic kidney complication: Secondary | ICD-10-CM | POA: Diagnosis not present

## 2018-11-19 DIAGNOSIS — D631 Anemia in chronic kidney disease: Secondary | ICD-10-CM | POA: Diagnosis not present

## 2018-11-19 DIAGNOSIS — Z992 Dependence on renal dialysis: Secondary | ICD-10-CM | POA: Diagnosis not present

## 2018-11-19 DIAGNOSIS — N186 End stage renal disease: Secondary | ICD-10-CM | POA: Diagnosis not present

## 2018-11-19 DIAGNOSIS — N2581 Secondary hyperparathyroidism of renal origin: Secondary | ICD-10-CM | POA: Diagnosis not present

## 2018-11-20 DIAGNOSIS — E1129 Type 2 diabetes mellitus with other diabetic kidney complication: Secondary | ICD-10-CM | POA: Diagnosis not present

## 2018-11-20 DIAGNOSIS — N186 End stage renal disease: Secondary | ICD-10-CM | POA: Diagnosis not present

## 2018-11-20 DIAGNOSIS — Z992 Dependence on renal dialysis: Secondary | ICD-10-CM | POA: Diagnosis not present

## 2018-11-22 DIAGNOSIS — E1129 Type 2 diabetes mellitus with other diabetic kidney complication: Secondary | ICD-10-CM | POA: Diagnosis not present

## 2018-11-22 DIAGNOSIS — Z992 Dependence on renal dialysis: Secondary | ICD-10-CM | POA: Diagnosis not present

## 2018-11-22 DIAGNOSIS — D509 Iron deficiency anemia, unspecified: Secondary | ICD-10-CM | POA: Diagnosis not present

## 2018-11-22 DIAGNOSIS — N186 End stage renal disease: Secondary | ICD-10-CM | POA: Diagnosis not present

## 2018-11-22 DIAGNOSIS — N2581 Secondary hyperparathyroidism of renal origin: Secondary | ICD-10-CM | POA: Diagnosis not present

## 2018-11-24 DIAGNOSIS — D509 Iron deficiency anemia, unspecified: Secondary | ICD-10-CM | POA: Diagnosis not present

## 2018-11-24 DIAGNOSIS — Z992 Dependence on renal dialysis: Secondary | ICD-10-CM | POA: Diagnosis not present

## 2018-11-24 DIAGNOSIS — N2581 Secondary hyperparathyroidism of renal origin: Secondary | ICD-10-CM | POA: Diagnosis not present

## 2018-11-24 DIAGNOSIS — N186 End stage renal disease: Secondary | ICD-10-CM | POA: Diagnosis not present

## 2018-11-24 DIAGNOSIS — E1129 Type 2 diabetes mellitus with other diabetic kidney complication: Secondary | ICD-10-CM | POA: Diagnosis not present

## 2018-11-26 DIAGNOSIS — Z992 Dependence on renal dialysis: Secondary | ICD-10-CM | POA: Diagnosis not present

## 2018-11-26 DIAGNOSIS — E1129 Type 2 diabetes mellitus with other diabetic kidney complication: Secondary | ICD-10-CM | POA: Diagnosis not present

## 2018-11-26 DIAGNOSIS — N186 End stage renal disease: Secondary | ICD-10-CM | POA: Diagnosis not present

## 2018-11-26 DIAGNOSIS — D509 Iron deficiency anemia, unspecified: Secondary | ICD-10-CM | POA: Diagnosis not present

## 2018-11-26 DIAGNOSIS — N2581 Secondary hyperparathyroidism of renal origin: Secondary | ICD-10-CM | POA: Diagnosis not present

## 2018-11-29 DIAGNOSIS — E1129 Type 2 diabetes mellitus with other diabetic kidney complication: Secondary | ICD-10-CM | POA: Diagnosis not present

## 2018-11-29 DIAGNOSIS — N2581 Secondary hyperparathyroidism of renal origin: Secondary | ICD-10-CM | POA: Diagnosis not present

## 2018-11-29 DIAGNOSIS — D509 Iron deficiency anemia, unspecified: Secondary | ICD-10-CM | POA: Diagnosis not present

## 2018-11-29 DIAGNOSIS — Z992 Dependence on renal dialysis: Secondary | ICD-10-CM | POA: Diagnosis not present

## 2018-11-29 DIAGNOSIS — N186 End stage renal disease: Secondary | ICD-10-CM | POA: Diagnosis not present

## 2018-12-01 DIAGNOSIS — N186 End stage renal disease: Secondary | ICD-10-CM | POA: Diagnosis not present

## 2018-12-01 DIAGNOSIS — N2581 Secondary hyperparathyroidism of renal origin: Secondary | ICD-10-CM | POA: Diagnosis not present

## 2018-12-01 DIAGNOSIS — D509 Iron deficiency anemia, unspecified: Secondary | ICD-10-CM | POA: Diagnosis not present

## 2018-12-01 DIAGNOSIS — Z992 Dependence on renal dialysis: Secondary | ICD-10-CM | POA: Diagnosis not present

## 2018-12-01 DIAGNOSIS — E1129 Type 2 diabetes mellitus with other diabetic kidney complication: Secondary | ICD-10-CM | POA: Diagnosis not present

## 2018-12-03 DIAGNOSIS — D509 Iron deficiency anemia, unspecified: Secondary | ICD-10-CM | POA: Diagnosis not present

## 2018-12-03 DIAGNOSIS — E1129 Type 2 diabetes mellitus with other diabetic kidney complication: Secondary | ICD-10-CM | POA: Diagnosis not present

## 2018-12-03 DIAGNOSIS — N2581 Secondary hyperparathyroidism of renal origin: Secondary | ICD-10-CM | POA: Diagnosis not present

## 2018-12-03 DIAGNOSIS — Z992 Dependence on renal dialysis: Secondary | ICD-10-CM | POA: Diagnosis not present

## 2018-12-03 DIAGNOSIS — N186 End stage renal disease: Secondary | ICD-10-CM | POA: Diagnosis not present

## 2018-12-04 ENCOUNTER — Other Ambulatory Visit: Payer: Self-pay

## 2018-12-04 ENCOUNTER — Encounter (HOSPITAL_COMMUNITY): Payer: Self-pay

## 2018-12-04 ENCOUNTER — Ambulatory Visit (HOSPITAL_COMMUNITY)
Admission: EM | Admit: 2018-12-04 | Discharge: 2018-12-04 | Disposition: A | Discharge status: Home / Self Care | Payer: Medicare Other | Attending: Family Medicine | Admitting: Family Medicine

## 2018-12-04 DIAGNOSIS — K6289 Other specified diseases of anus and rectum: Secondary | ICD-10-CM | POA: Diagnosis not present

## 2018-12-04 MED ORDER — LIDOCAINE HCL 2 % EX GEL: 1.0000 "application " | CUTANEOUS | 1 refills | Status: DC | PRN

## 2018-12-04 NOTE — Discharge Instructions (Addendum)
Two things you can do at home to help with a possible anal fissure:  Fiber -- Fiber therapy prevents hard bowel movements, which could reinjure a healing fissure. Increasing dietary fiber and water intake is the best way to soften and bulk the stool. The recommended dietary fiber intake is between 20 and 35 grams per day.  Sitz Bath -- Warm sitz baths, which can relax the anal sphincter and improve blood flow to the anal mucosa, are recommended for patients with anal fissures. During a sitz bath, the anus is immersed in warm water for approximately 10 to 15 minutes two to three times daily. Sitz bath kits are available in most drugstores and portable bowls can be used at work or school. At home, it is also possible to use a bathtub for sitz bath by filling it with two to three inches of warm water. Additives such as soap and bubble bath are not recommended. After a sitz bath, it is important to towel or blow dry (with a hair dryer on low heat setting) the anal area well.

## 2018-12-04 NOTE — ED Provider Notes (Addendum)
[place]   [id number] 12/04/18 Arrival Time: [place] PLAN:  1. Rectal pain    Suspect anal fissure. No external hemorrhoids seen. No gross bleeding. Difficult exam secondary to reported pain. Benign abdominal exam.  Trial of: Meds ordered this encounter  Medications  . lidocaine (XYLOCAINE) 2 % jelly    Sig: Apply 1 application topically as needed.    Dispense:  30 mL    Refill:  1   Follow-up Information    Schedule an appointment as soon as possible for a visit  with [facility], [place].   Specialty:  General Surgery Contact information: [place] [address] [phone]            Discharge Instructions     Two things you can do at home to help with a possible anal fissure:  Fiber - Fiber therapy prevents hard bowel movements, which could reinjure a healing fissure. Increasing dietary fiber and water intake is the best way to soften and bulk the stool. The recommended dietary fiber intake is between 20 and 35 grams per day.  Sitz Bath - Warm sitz baths, which can relax the anal sphincter and improve blood flow to the anal mucosa, are recommended for patients with anal fissures. During a sitz bath, the anus is immersed in warm water for approximately 10 to 15 minutes two to three times daily. Sitz bath kits are available in most drugstores and portable bowls can be used at work or school. At home, it is also possible to use a bathtub for sitz bath by filling it with two to three inches of warm water. Additives such as soap and bubble bath are not recommended. After a sitz bath, it is important to towel or blow dry (with a hair dryer on low heat setting) the anal area well.   Reviewed expectations re: course of current medical issues. Questions answered. Outlined signs and symptoms indicating need for more acute intervention. Patient verbalized understanding. After Visit Summary given.   SUBJECTIVE: History  from: patient. [name] is a 53 y.o. male with DM and ESRD requiring dialysis who presents with complaint of persistent rectal pain since this morning. Reports intermittent rectal pain over the past two weeks. Usually after bowel movements and with fairly quick resolution. No rectal bleeding. Larger bowel movement this morning with the same pain that has not resolved. Described as sharp without radiation.  Admits hard bowel movements that cause him to strain and to sit on toilet for long periods of time. Fever: absent. Aggravating factors: include hard stools. Alleviating factors: have not been identified. He denies arthralgias, chills, diarrhea, dysuria, myalgias, nausea, sweats and vomiting. Appetite: normal. PO intake: normal. Ambulatory without assistance. Urinary symptoms: none. No specific abdominal pain or back pain reported. History of similar: no. OTC treatment: none reported.  Past Surgical History:  Procedure Laterality Date  . AV FISTULA PLACEMENT  06-18-11   Right brachiocephalic AVF  . EYE SURGERY     left eye for Laser, diabetic retinopathy  . FISTULOGRAM Right 11/06/2011   Procedure: FISTULOGRAM;  Surgeon: Conrad Donahue, MD;  Location: [facility] CATH LAB;  Service: Cardiovascular;  Laterality: Right;  . HEMATOMA EVACUATION Right Feb. 25, 2014  . HEMATOMA EVACUATION Right 12/14/2012   Procedure: EVACUATION HEMATOMA;  Surgeon: Conrad , MD;  Location: [place] [facility];  Service: Vascular;  Laterality: Right;  . INSERTION OF DIALYSIS CATHETER  12/14/2012   Procedure: INSERTION OF DIALYSIS  CATHETER;  Surgeon: Conrad Beason, MD;  Location: [place];  Service: Vascular;;  . REVISON OF ARTERIOVENOUS FISTULA Right 12/14/2012   Procedure: REVISON OF upper arm ARTERIOVENOUS FISTULA using 83mmx10cm gortex graft;  Surgeon: Conrad Coke, MD;  Location: [facility];  Service: Vascular;  Laterality: Right;  . SOFT TISSUE MASS EXCISION     Right arm, Left leg  for MRSA infection   ROS: As per HPI. All other systems  negative.  OBJECTIVE:  Vitals:   12/04/18 1013  Pulse: 98  Resp: 18  Temp: 97.8 F (36.6 C)  TempSrc: Oral  SpO2: 100%    General appearance: alert, oriented, no acute distress Lungs: clear to auscultation bilaterally; unlabored respirations Heart: regular rate and rhythm Abdomen: soft; without distention; no tenderness; normal bowel sounds; without masses or organomegaly; without guarding or rebound tenderness Rectal: (difficult exam secondary to reported intense pain) rectum appears normal; very painful to palpation anteriorly; no active bleeding; could not perform adequate digital rectal exam secondary to his pain Back: without CVA tenderness; FROM at waist Extremities: without LE edema; symmetrical; without gross deformities Skin: warm and dry Neurologic: normal gait Psychological: alert and cooperative; normal mood and affect   Allergies  Allergen Reactions  . Amoxicillin Itching  . Hydromorphone Itching    Patient states he may take with benadryl. Makes feet itch.  Marland Kitchen Percocet [Oxycodone-Acetaminophen] Itching and Other (See Comments)    Legs only. Itching and burning feeling.                                               Past Medical History:  Diagnosis Date  . Chills    at night - sometimes  . Complication of anesthesia   . Diabetes mellitus    controlled by diet  . 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 Eczema   . Fatigue    loss of fatigue  . Headache(784.0)    Years ago  . Heart murmur    years ago  . History of blood transfusion   . Hypertension    sees Dr. Jaci Standard  . MRSA (methicillin resistant staph aureus) culture positive   . PONV (postoperative nausea and vomiting)   . Poor circulation   . Renal disorder    Social History   Socioeconomic History  . Marital status: Married    Spouse name: Not on file  . Number of children: Not on file  . Years of education: Not on file  . Highest education level: Not on file  Occupational  History  . Not on file  Social Needs  . Financial resource strain: Not on file  . Food insecurity:    Worry: Not on file    Inability: Not on file  . Transportation needs:    Medical: Not on file    Non-medical: Not on file  Tobacco Use  . Smoking status: Never Smoker  . Smokeless tobacco: Never Used  Substance and Sexual Activity  . Alcohol use: No  . Drug use: No  . Sexual activity: Yes  Lifestyle  . Physical activity:    Days per week: Not on file    Minutes per session: Not on file  . Stress: Not on file  Relationships  . Social connections:    Talks on phone: Not on file    Gets together: Not on file  Attends religious service: Not on file    Active member of club or organization: Not on file    Attends meetings of clubs or organizations: Not on file    Relationship status: Not on file  . Intimate partner violence:    Fear of current or ex partner: Not on file    Emotionally abused: Not on file    Physically abused: Not on file    Forced sexual activity: Not on file  Other Topics Concern  . Not on file  Social History Narrative  . Not on file   Family History  Problem Relation Age of Onset  . Diabetes Mother      Vanessa Kick, MD 12/04/18 1131    Vanessa Kick, MD 12/04/18 1137

## 2018-12-04 NOTE — ED Triage Notes (Signed)
Pt cc hemorrhoids x 3 week. Pt pain is unable.

## 2018-12-06 DIAGNOSIS — N2581 Secondary hyperparathyroidism of renal origin: Secondary | ICD-10-CM | POA: Diagnosis not present

## 2018-12-06 DIAGNOSIS — E1129 Type 2 diabetes mellitus with other diabetic kidney complication: Secondary | ICD-10-CM | POA: Diagnosis not present

## 2018-12-06 DIAGNOSIS — D509 Iron deficiency anemia, unspecified: Secondary | ICD-10-CM | POA: Diagnosis not present

## 2018-12-06 DIAGNOSIS — Z992 Dependence on renal dialysis: Secondary | ICD-10-CM | POA: Diagnosis not present

## 2018-12-06 DIAGNOSIS — N186 End stage renal disease: Secondary | ICD-10-CM | POA: Diagnosis not present

## 2018-12-08 DIAGNOSIS — N2581 Secondary hyperparathyroidism of renal origin: Secondary | ICD-10-CM | POA: Diagnosis not present

## 2018-12-08 DIAGNOSIS — Z992 Dependence on renal dialysis: Secondary | ICD-10-CM | POA: Diagnosis not present

## 2018-12-08 DIAGNOSIS — N186 End stage renal disease: Secondary | ICD-10-CM | POA: Diagnosis not present

## 2018-12-08 DIAGNOSIS — D509 Iron deficiency anemia, unspecified: Secondary | ICD-10-CM | POA: Diagnosis not present

## 2018-12-08 DIAGNOSIS — E1129 Type 2 diabetes mellitus with other diabetic kidney complication: Secondary | ICD-10-CM | POA: Diagnosis not present

## 2018-12-10 DIAGNOSIS — N186 End stage renal disease: Secondary | ICD-10-CM | POA: Diagnosis not present

## 2018-12-10 DIAGNOSIS — N2581 Secondary hyperparathyroidism of renal origin: Secondary | ICD-10-CM | POA: Diagnosis not present

## 2018-12-10 DIAGNOSIS — D509 Iron deficiency anemia, unspecified: Secondary | ICD-10-CM | POA: Diagnosis not present

## 2018-12-10 DIAGNOSIS — E1129 Type 2 diabetes mellitus with other diabetic kidney complication: Secondary | ICD-10-CM | POA: Diagnosis not present

## 2018-12-10 DIAGNOSIS — Z992 Dependence on renal dialysis: Secondary | ICD-10-CM | POA: Diagnosis not present

## 2018-12-13 DIAGNOSIS — Z992 Dependence on renal dialysis: Secondary | ICD-10-CM | POA: Diagnosis not present

## 2018-12-13 DIAGNOSIS — D509 Iron deficiency anemia, unspecified: Secondary | ICD-10-CM | POA: Diagnosis not present

## 2018-12-13 DIAGNOSIS — N186 End stage renal disease: Secondary | ICD-10-CM | POA: Diagnosis not present

## 2018-12-13 DIAGNOSIS — N2581 Secondary hyperparathyroidism of renal origin: Secondary | ICD-10-CM | POA: Diagnosis not present

## 2018-12-13 DIAGNOSIS — E1129 Type 2 diabetes mellitus with other diabetic kidney complication: Secondary | ICD-10-CM | POA: Diagnosis not present

## 2018-12-15 DIAGNOSIS — D509 Iron deficiency anemia, unspecified: Secondary | ICD-10-CM | POA: Diagnosis not present

## 2018-12-15 DIAGNOSIS — Z992 Dependence on renal dialysis: Secondary | ICD-10-CM | POA: Diagnosis not present

## 2018-12-15 DIAGNOSIS — E1129 Type 2 diabetes mellitus with other diabetic kidney complication: Secondary | ICD-10-CM | POA: Diagnosis not present

## 2018-12-15 DIAGNOSIS — N2581 Secondary hyperparathyroidism of renal origin: Secondary | ICD-10-CM | POA: Diagnosis not present

## 2018-12-15 DIAGNOSIS — N186 End stage renal disease: Secondary | ICD-10-CM | POA: Diagnosis not present

## 2018-12-17 DIAGNOSIS — Z992 Dependence on renal dialysis: Secondary | ICD-10-CM | POA: Diagnosis not present

## 2018-12-17 DIAGNOSIS — N2581 Secondary hyperparathyroidism of renal origin: Secondary | ICD-10-CM | POA: Diagnosis not present

## 2018-12-17 DIAGNOSIS — D509 Iron deficiency anemia, unspecified: Secondary | ICD-10-CM | POA: Diagnosis not present

## 2018-12-17 DIAGNOSIS — E1129 Type 2 diabetes mellitus with other diabetic kidney complication: Secondary | ICD-10-CM | POA: Diagnosis not present

## 2018-12-17 DIAGNOSIS — N186 End stage renal disease: Secondary | ICD-10-CM | POA: Diagnosis not present

## 2018-12-19 DIAGNOSIS — Z992 Dependence on renal dialysis: Secondary | ICD-10-CM | POA: Diagnosis not present

## 2018-12-19 DIAGNOSIS — E1129 Type 2 diabetes mellitus with other diabetic kidney complication: Secondary | ICD-10-CM | POA: Diagnosis not present

## 2018-12-19 DIAGNOSIS — N186 End stage renal disease: Secondary | ICD-10-CM | POA: Diagnosis not present

## 2018-12-20 DIAGNOSIS — D509 Iron deficiency anemia, unspecified: Secondary | ICD-10-CM | POA: Diagnosis not present

## 2018-12-20 DIAGNOSIS — Z992 Dependence on renal dialysis: Secondary | ICD-10-CM | POA: Diagnosis not present

## 2018-12-20 DIAGNOSIS — N2581 Secondary hyperparathyroidism of renal origin: Secondary | ICD-10-CM | POA: Diagnosis not present

## 2018-12-20 DIAGNOSIS — E1129 Type 2 diabetes mellitus with other diabetic kidney complication: Secondary | ICD-10-CM | POA: Diagnosis not present

## 2018-12-20 DIAGNOSIS — N186 End stage renal disease: Secondary | ICD-10-CM | POA: Diagnosis not present

## 2018-12-22 DIAGNOSIS — N186 End stage renal disease: Secondary | ICD-10-CM | POA: Diagnosis not present

## 2018-12-22 DIAGNOSIS — E1129 Type 2 diabetes mellitus with other diabetic kidney complication: Secondary | ICD-10-CM | POA: Diagnosis not present

## 2018-12-22 DIAGNOSIS — N2581 Secondary hyperparathyroidism of renal origin: Secondary | ICD-10-CM | POA: Diagnosis not present

## 2018-12-22 DIAGNOSIS — D509 Iron deficiency anemia, unspecified: Secondary | ICD-10-CM | POA: Diagnosis not present

## 2018-12-22 DIAGNOSIS — Z992 Dependence on renal dialysis: Secondary | ICD-10-CM | POA: Diagnosis not present

## 2018-12-24 DIAGNOSIS — D509 Iron deficiency anemia, unspecified: Secondary | ICD-10-CM | POA: Diagnosis not present

## 2018-12-24 DIAGNOSIS — N2581 Secondary hyperparathyroidism of renal origin: Secondary | ICD-10-CM | POA: Diagnosis not present

## 2018-12-24 DIAGNOSIS — N186 End stage renal disease: Secondary | ICD-10-CM | POA: Diagnosis not present

## 2018-12-24 DIAGNOSIS — Z992 Dependence on renal dialysis: Secondary | ICD-10-CM | POA: Diagnosis not present

## 2018-12-24 DIAGNOSIS — E1129 Type 2 diabetes mellitus with other diabetic kidney complication: Secondary | ICD-10-CM | POA: Diagnosis not present

## 2018-12-27 DIAGNOSIS — D509 Iron deficiency anemia, unspecified: Secondary | ICD-10-CM | POA: Diagnosis not present

## 2018-12-27 DIAGNOSIS — N186 End stage renal disease: Secondary | ICD-10-CM | POA: Diagnosis not present

## 2018-12-27 DIAGNOSIS — N2581 Secondary hyperparathyroidism of renal origin: Secondary | ICD-10-CM | POA: Diagnosis not present

## 2018-12-27 DIAGNOSIS — Z992 Dependence on renal dialysis: Secondary | ICD-10-CM | POA: Diagnosis not present

## 2018-12-27 DIAGNOSIS — E1129 Type 2 diabetes mellitus with other diabetic kidney complication: Secondary | ICD-10-CM | POA: Diagnosis not present

## 2018-12-29 DIAGNOSIS — E1129 Type 2 diabetes mellitus with other diabetic kidney complication: Secondary | ICD-10-CM | POA: Diagnosis not present

## 2018-12-29 DIAGNOSIS — N186 End stage renal disease: Secondary | ICD-10-CM | POA: Diagnosis not present

## 2018-12-29 DIAGNOSIS — D509 Iron deficiency anemia, unspecified: Secondary | ICD-10-CM | POA: Diagnosis not present

## 2018-12-29 DIAGNOSIS — Z992 Dependence on renal dialysis: Secondary | ICD-10-CM | POA: Diagnosis not present

## 2018-12-29 DIAGNOSIS — N2581 Secondary hyperparathyroidism of renal origin: Secondary | ICD-10-CM | POA: Diagnosis not present

## 2018-12-31 DIAGNOSIS — Z992 Dependence on renal dialysis: Secondary | ICD-10-CM | POA: Diagnosis not present

## 2018-12-31 DIAGNOSIS — N2581 Secondary hyperparathyroidism of renal origin: Secondary | ICD-10-CM | POA: Diagnosis not present

## 2018-12-31 DIAGNOSIS — D509 Iron deficiency anemia, unspecified: Secondary | ICD-10-CM | POA: Diagnosis not present

## 2018-12-31 DIAGNOSIS — N186 End stage renal disease: Secondary | ICD-10-CM | POA: Diagnosis not present

## 2018-12-31 DIAGNOSIS — E1129 Type 2 diabetes mellitus with other diabetic kidney complication: Secondary | ICD-10-CM | POA: Diagnosis not present

## 2019-01-03 DIAGNOSIS — Z992 Dependence on renal dialysis: Secondary | ICD-10-CM | POA: Diagnosis not present

## 2019-01-03 DIAGNOSIS — E1129 Type 2 diabetes mellitus with other diabetic kidney complication: Secondary | ICD-10-CM | POA: Diagnosis not present

## 2019-01-03 DIAGNOSIS — N2581 Secondary hyperparathyroidism of renal origin: Secondary | ICD-10-CM | POA: Diagnosis not present

## 2019-01-03 DIAGNOSIS — N186 End stage renal disease: Secondary | ICD-10-CM | POA: Diagnosis not present

## 2019-01-03 DIAGNOSIS — D509 Iron deficiency anemia, unspecified: Secondary | ICD-10-CM | POA: Diagnosis not present

## 2019-01-05 DIAGNOSIS — E1129 Type 2 diabetes mellitus with other diabetic kidney complication: Secondary | ICD-10-CM | POA: Diagnosis not present

## 2019-01-05 DIAGNOSIS — Z992 Dependence on renal dialysis: Secondary | ICD-10-CM | POA: Diagnosis not present

## 2019-01-05 DIAGNOSIS — N2581 Secondary hyperparathyroidism of renal origin: Secondary | ICD-10-CM | POA: Diagnosis not present

## 2019-01-05 DIAGNOSIS — N186 End stage renal disease: Secondary | ICD-10-CM | POA: Diagnosis not present

## 2019-01-05 DIAGNOSIS — D509 Iron deficiency anemia, unspecified: Secondary | ICD-10-CM | POA: Diagnosis not present

## 2019-01-07 DIAGNOSIS — N2581 Secondary hyperparathyroidism of renal origin: Secondary | ICD-10-CM | POA: Diagnosis not present

## 2019-01-07 DIAGNOSIS — Z992 Dependence on renal dialysis: Secondary | ICD-10-CM | POA: Diagnosis not present

## 2019-01-07 DIAGNOSIS — N186 End stage renal disease: Secondary | ICD-10-CM | POA: Diagnosis not present

## 2019-01-07 DIAGNOSIS — E1129 Type 2 diabetes mellitus with other diabetic kidney complication: Secondary | ICD-10-CM | POA: Diagnosis not present

## 2019-01-07 DIAGNOSIS — D509 Iron deficiency anemia, unspecified: Secondary | ICD-10-CM | POA: Diagnosis not present

## 2019-01-10 DIAGNOSIS — E1129 Type 2 diabetes mellitus with other diabetic kidney complication: Secondary | ICD-10-CM | POA: Diagnosis not present

## 2019-01-10 DIAGNOSIS — N186 End stage renal disease: Secondary | ICD-10-CM | POA: Diagnosis not present

## 2019-01-10 DIAGNOSIS — N2581 Secondary hyperparathyroidism of renal origin: Secondary | ICD-10-CM | POA: Diagnosis not present

## 2019-01-10 DIAGNOSIS — D509 Iron deficiency anemia, unspecified: Secondary | ICD-10-CM | POA: Diagnosis not present

## 2019-01-10 DIAGNOSIS — Z992 Dependence on renal dialysis: Secondary | ICD-10-CM | POA: Diagnosis not present

## 2019-01-12 DIAGNOSIS — Z992 Dependence on renal dialysis: Secondary | ICD-10-CM | POA: Diagnosis not present

## 2019-01-12 DIAGNOSIS — D509 Iron deficiency anemia, unspecified: Secondary | ICD-10-CM | POA: Diagnosis not present

## 2019-01-12 DIAGNOSIS — E1129 Type 2 diabetes mellitus with other diabetic kidney complication: Secondary | ICD-10-CM | POA: Diagnosis not present

## 2019-01-12 DIAGNOSIS — N186 End stage renal disease: Secondary | ICD-10-CM | POA: Diagnosis not present

## 2019-01-12 DIAGNOSIS — N2581 Secondary hyperparathyroidism of renal origin: Secondary | ICD-10-CM | POA: Diagnosis not present

## 2019-01-14 DIAGNOSIS — N186 End stage renal disease: Secondary | ICD-10-CM | POA: Diagnosis not present

## 2019-01-14 DIAGNOSIS — D509 Iron deficiency anemia, unspecified: Secondary | ICD-10-CM | POA: Diagnosis not present

## 2019-01-14 DIAGNOSIS — N2581 Secondary hyperparathyroidism of renal origin: Secondary | ICD-10-CM | POA: Diagnosis not present

## 2019-01-14 DIAGNOSIS — E1129 Type 2 diabetes mellitus with other diabetic kidney complication: Secondary | ICD-10-CM | POA: Diagnosis not present

## 2019-01-14 DIAGNOSIS — Z992 Dependence on renal dialysis: Secondary | ICD-10-CM | POA: Diagnosis not present

## 2019-01-17 DIAGNOSIS — N2581 Secondary hyperparathyroidism of renal origin: Secondary | ICD-10-CM | POA: Diagnosis not present

## 2019-01-17 DIAGNOSIS — E1129 Type 2 diabetes mellitus with other diabetic kidney complication: Secondary | ICD-10-CM | POA: Diagnosis not present

## 2019-01-17 DIAGNOSIS — Z992 Dependence on renal dialysis: Secondary | ICD-10-CM | POA: Diagnosis not present

## 2019-01-17 DIAGNOSIS — N186 End stage renal disease: Secondary | ICD-10-CM | POA: Diagnosis not present

## 2019-01-17 DIAGNOSIS — D509 Iron deficiency anemia, unspecified: Secondary | ICD-10-CM | POA: Diagnosis not present

## 2019-01-19 DIAGNOSIS — D509 Iron deficiency anemia, unspecified: Secondary | ICD-10-CM | POA: Diagnosis not present

## 2019-01-19 DIAGNOSIS — Z992 Dependence on renal dialysis: Secondary | ICD-10-CM | POA: Diagnosis not present

## 2019-01-19 DIAGNOSIS — D631 Anemia in chronic kidney disease: Secondary | ICD-10-CM | POA: Diagnosis not present

## 2019-01-19 DIAGNOSIS — E1129 Type 2 diabetes mellitus with other diabetic kidney complication: Secondary | ICD-10-CM | POA: Diagnosis not present

## 2019-01-19 DIAGNOSIS — N186 End stage renal disease: Secondary | ICD-10-CM | POA: Diagnosis not present

## 2019-01-19 DIAGNOSIS — N2581 Secondary hyperparathyroidism of renal origin: Secondary | ICD-10-CM | POA: Diagnosis not present

## 2019-01-21 DIAGNOSIS — E1129 Type 2 diabetes mellitus with other diabetic kidney complication: Secondary | ICD-10-CM | POA: Diagnosis not present

## 2019-01-21 DIAGNOSIS — N2581 Secondary hyperparathyroidism of renal origin: Secondary | ICD-10-CM | POA: Diagnosis not present

## 2019-01-21 DIAGNOSIS — N186 End stage renal disease: Secondary | ICD-10-CM | POA: Diagnosis not present

## 2019-01-21 DIAGNOSIS — Z992 Dependence on renal dialysis: Secondary | ICD-10-CM | POA: Diagnosis not present

## 2019-01-21 DIAGNOSIS — D631 Anemia in chronic kidney disease: Secondary | ICD-10-CM | POA: Diagnosis not present

## 2019-01-21 DIAGNOSIS — D509 Iron deficiency anemia, unspecified: Secondary | ICD-10-CM | POA: Diagnosis not present

## 2019-01-24 DIAGNOSIS — E1129 Type 2 diabetes mellitus with other diabetic kidney complication: Secondary | ICD-10-CM | POA: Diagnosis not present

## 2019-01-24 DIAGNOSIS — D631 Anemia in chronic kidney disease: Secondary | ICD-10-CM | POA: Diagnosis not present

## 2019-01-24 DIAGNOSIS — N2581 Secondary hyperparathyroidism of renal origin: Secondary | ICD-10-CM | POA: Diagnosis not present

## 2019-01-24 DIAGNOSIS — D509 Iron deficiency anemia, unspecified: Secondary | ICD-10-CM | POA: Diagnosis not present

## 2019-01-24 DIAGNOSIS — N186 End stage renal disease: Secondary | ICD-10-CM | POA: Diagnosis not present

## 2019-01-24 DIAGNOSIS — Z992 Dependence on renal dialysis: Secondary | ICD-10-CM | POA: Diagnosis not present

## 2019-01-26 DIAGNOSIS — D509 Iron deficiency anemia, unspecified: Secondary | ICD-10-CM | POA: Diagnosis not present

## 2019-01-26 DIAGNOSIS — N2581 Secondary hyperparathyroidism of renal origin: Secondary | ICD-10-CM | POA: Diagnosis not present

## 2019-01-26 DIAGNOSIS — E1129 Type 2 diabetes mellitus with other diabetic kidney complication: Secondary | ICD-10-CM | POA: Diagnosis not present

## 2019-01-26 DIAGNOSIS — Z992 Dependence on renal dialysis: Secondary | ICD-10-CM | POA: Diagnosis not present

## 2019-01-26 DIAGNOSIS — D631 Anemia in chronic kidney disease: Secondary | ICD-10-CM | POA: Diagnosis not present

## 2019-01-26 DIAGNOSIS — N186 End stage renal disease: Secondary | ICD-10-CM | POA: Diagnosis not present

## 2019-01-28 DIAGNOSIS — N2581 Secondary hyperparathyroidism of renal origin: Secondary | ICD-10-CM | POA: Diagnosis not present

## 2019-01-28 DIAGNOSIS — Z992 Dependence on renal dialysis: Secondary | ICD-10-CM | POA: Diagnosis not present

## 2019-01-28 DIAGNOSIS — E1129 Type 2 diabetes mellitus with other diabetic kidney complication: Secondary | ICD-10-CM | POA: Diagnosis not present

## 2019-01-28 DIAGNOSIS — D509 Iron deficiency anemia, unspecified: Secondary | ICD-10-CM | POA: Diagnosis not present

## 2019-01-28 DIAGNOSIS — D631 Anemia in chronic kidney disease: Secondary | ICD-10-CM | POA: Diagnosis not present

## 2019-01-28 DIAGNOSIS — N186 End stage renal disease: Secondary | ICD-10-CM | POA: Diagnosis not present

## 2019-01-31 DIAGNOSIS — N2581 Secondary hyperparathyroidism of renal origin: Secondary | ICD-10-CM | POA: Diagnosis not present

## 2019-01-31 DIAGNOSIS — D509 Iron deficiency anemia, unspecified: Secondary | ICD-10-CM | POA: Diagnosis not present

## 2019-01-31 DIAGNOSIS — E1129 Type 2 diabetes mellitus with other diabetic kidney complication: Secondary | ICD-10-CM | POA: Diagnosis not present

## 2019-01-31 DIAGNOSIS — Z992 Dependence on renal dialysis: Secondary | ICD-10-CM | POA: Diagnosis not present

## 2019-01-31 DIAGNOSIS — D631 Anemia in chronic kidney disease: Secondary | ICD-10-CM | POA: Diagnosis not present

## 2019-01-31 DIAGNOSIS — N186 End stage renal disease: Secondary | ICD-10-CM | POA: Diagnosis not present

## 2019-02-02 DIAGNOSIS — E1129 Type 2 diabetes mellitus with other diabetic kidney complication: Secondary | ICD-10-CM | POA: Diagnosis not present

## 2019-02-02 DIAGNOSIS — N2581 Secondary hyperparathyroidism of renal origin: Secondary | ICD-10-CM | POA: Diagnosis not present

## 2019-02-02 DIAGNOSIS — N186 End stage renal disease: Secondary | ICD-10-CM | POA: Diagnosis not present

## 2019-02-02 DIAGNOSIS — D509 Iron deficiency anemia, unspecified: Secondary | ICD-10-CM | POA: Diagnosis not present

## 2019-02-02 DIAGNOSIS — Z992 Dependence on renal dialysis: Secondary | ICD-10-CM | POA: Diagnosis not present

## 2019-02-02 DIAGNOSIS — D631 Anemia in chronic kidney disease: Secondary | ICD-10-CM | POA: Diagnosis not present

## 2019-02-04 DIAGNOSIS — D631 Anemia in chronic kidney disease: Secondary | ICD-10-CM | POA: Diagnosis not present

## 2019-02-04 DIAGNOSIS — Z992 Dependence on renal dialysis: Secondary | ICD-10-CM | POA: Diagnosis not present

## 2019-02-04 DIAGNOSIS — N186 End stage renal disease: Secondary | ICD-10-CM | POA: Diagnosis not present

## 2019-02-04 DIAGNOSIS — D509 Iron deficiency anemia, unspecified: Secondary | ICD-10-CM | POA: Diagnosis not present

## 2019-02-04 DIAGNOSIS — N2581 Secondary hyperparathyroidism of renal origin: Secondary | ICD-10-CM | POA: Diagnosis not present

## 2019-02-04 DIAGNOSIS — E1129 Type 2 diabetes mellitus with other diabetic kidney complication: Secondary | ICD-10-CM | POA: Diagnosis not present

## 2019-02-07 DIAGNOSIS — N186 End stage renal disease: Secondary | ICD-10-CM | POA: Diagnosis not present

## 2019-02-07 DIAGNOSIS — D509 Iron deficiency anemia, unspecified: Secondary | ICD-10-CM | POA: Diagnosis not present

## 2019-02-07 DIAGNOSIS — D631 Anemia in chronic kidney disease: Secondary | ICD-10-CM | POA: Diagnosis not present

## 2019-02-07 DIAGNOSIS — Z992 Dependence on renal dialysis: Secondary | ICD-10-CM | POA: Diagnosis not present

## 2019-02-07 DIAGNOSIS — E1129 Type 2 diabetes mellitus with other diabetic kidney complication: Secondary | ICD-10-CM | POA: Diagnosis not present

## 2019-02-07 DIAGNOSIS — N2581 Secondary hyperparathyroidism of renal origin: Secondary | ICD-10-CM | POA: Diagnosis not present

## 2019-02-09 DIAGNOSIS — N186 End stage renal disease: Secondary | ICD-10-CM | POA: Diagnosis not present

## 2019-02-09 DIAGNOSIS — E1129 Type 2 diabetes mellitus with other diabetic kidney complication: Secondary | ICD-10-CM | POA: Diagnosis not present

## 2019-02-09 DIAGNOSIS — N2581 Secondary hyperparathyroidism of renal origin: Secondary | ICD-10-CM | POA: Diagnosis not present

## 2019-02-09 DIAGNOSIS — D631 Anemia in chronic kidney disease: Secondary | ICD-10-CM | POA: Diagnosis not present

## 2019-02-09 DIAGNOSIS — D509 Iron deficiency anemia, unspecified: Secondary | ICD-10-CM | POA: Diagnosis not present

## 2019-02-09 DIAGNOSIS — Z992 Dependence on renal dialysis: Secondary | ICD-10-CM | POA: Diagnosis not present

## 2019-02-11 DIAGNOSIS — N186 End stage renal disease: Secondary | ICD-10-CM | POA: Diagnosis not present

## 2019-02-11 DIAGNOSIS — Z992 Dependence on renal dialysis: Secondary | ICD-10-CM | POA: Diagnosis not present

## 2019-02-11 DIAGNOSIS — D631 Anemia in chronic kidney disease: Secondary | ICD-10-CM | POA: Diagnosis not present

## 2019-02-11 DIAGNOSIS — N2581 Secondary hyperparathyroidism of renal origin: Secondary | ICD-10-CM | POA: Diagnosis not present

## 2019-02-11 DIAGNOSIS — D509 Iron deficiency anemia, unspecified: Secondary | ICD-10-CM | POA: Diagnosis not present

## 2019-02-11 DIAGNOSIS — E1129 Type 2 diabetes mellitus with other diabetic kidney complication: Secondary | ICD-10-CM | POA: Diagnosis not present

## 2019-02-14 DIAGNOSIS — Z992 Dependence on renal dialysis: Secondary | ICD-10-CM | POA: Diagnosis not present

## 2019-02-14 DIAGNOSIS — D631 Anemia in chronic kidney disease: Secondary | ICD-10-CM | POA: Diagnosis not present

## 2019-02-14 DIAGNOSIS — E1129 Type 2 diabetes mellitus with other diabetic kidney complication: Secondary | ICD-10-CM | POA: Diagnosis not present

## 2019-02-14 DIAGNOSIS — D509 Iron deficiency anemia, unspecified: Secondary | ICD-10-CM | POA: Diagnosis not present

## 2019-02-14 DIAGNOSIS — N2581 Secondary hyperparathyroidism of renal origin: Secondary | ICD-10-CM | POA: Diagnosis not present

## 2019-02-14 DIAGNOSIS — N186 End stage renal disease: Secondary | ICD-10-CM | POA: Diagnosis not present

## 2019-02-16 DIAGNOSIS — N186 End stage renal disease: Secondary | ICD-10-CM | POA: Diagnosis not present

## 2019-02-16 DIAGNOSIS — E1129 Type 2 diabetes mellitus with other diabetic kidney complication: Secondary | ICD-10-CM | POA: Diagnosis not present

## 2019-02-16 DIAGNOSIS — D509 Iron deficiency anemia, unspecified: Secondary | ICD-10-CM | POA: Diagnosis not present

## 2019-02-16 DIAGNOSIS — N2581 Secondary hyperparathyroidism of renal origin: Secondary | ICD-10-CM | POA: Diagnosis not present

## 2019-02-16 DIAGNOSIS — Z992 Dependence on renal dialysis: Secondary | ICD-10-CM | POA: Diagnosis not present

## 2019-02-16 DIAGNOSIS — D631 Anemia in chronic kidney disease: Secondary | ICD-10-CM | POA: Diagnosis not present

## 2019-02-18 DIAGNOSIS — Z992 Dependence on renal dialysis: Secondary | ICD-10-CM | POA: Diagnosis not present

## 2019-02-18 DIAGNOSIS — N2581 Secondary hyperparathyroidism of renal origin: Secondary | ICD-10-CM | POA: Diagnosis not present

## 2019-02-18 DIAGNOSIS — E1129 Type 2 diabetes mellitus with other diabetic kidney complication: Secondary | ICD-10-CM | POA: Diagnosis not present

## 2019-02-18 DIAGNOSIS — D509 Iron deficiency anemia, unspecified: Secondary | ICD-10-CM | POA: Diagnosis not present

## 2019-02-18 DIAGNOSIS — N186 End stage renal disease: Secondary | ICD-10-CM | POA: Diagnosis not present

## 2019-02-21 DIAGNOSIS — Z992 Dependence on renal dialysis: Secondary | ICD-10-CM | POA: Diagnosis not present

## 2019-02-21 DIAGNOSIS — N2581 Secondary hyperparathyroidism of renal origin: Secondary | ICD-10-CM | POA: Diagnosis not present

## 2019-02-21 DIAGNOSIS — D509 Iron deficiency anemia, unspecified: Secondary | ICD-10-CM | POA: Diagnosis not present

## 2019-02-21 DIAGNOSIS — N186 End stage renal disease: Secondary | ICD-10-CM | POA: Diagnosis not present

## 2019-02-21 DIAGNOSIS — E1129 Type 2 diabetes mellitus with other diabetic kidney complication: Secondary | ICD-10-CM | POA: Diagnosis not present

## 2019-02-23 DIAGNOSIS — N2581 Secondary hyperparathyroidism of renal origin: Secondary | ICD-10-CM | POA: Diagnosis not present

## 2019-02-23 DIAGNOSIS — E1129 Type 2 diabetes mellitus with other diabetic kidney complication: Secondary | ICD-10-CM | POA: Diagnosis not present

## 2019-02-23 DIAGNOSIS — Z992 Dependence on renal dialysis: Secondary | ICD-10-CM | POA: Diagnosis not present

## 2019-02-23 DIAGNOSIS — N186 End stage renal disease: Secondary | ICD-10-CM | POA: Diagnosis not present

## 2019-02-23 DIAGNOSIS — D509 Iron deficiency anemia, unspecified: Secondary | ICD-10-CM | POA: Diagnosis not present

## 2019-02-25 DIAGNOSIS — N186 End stage renal disease: Secondary | ICD-10-CM | POA: Diagnosis not present

## 2019-02-25 DIAGNOSIS — N2581 Secondary hyperparathyroidism of renal origin: Secondary | ICD-10-CM | POA: Diagnosis not present

## 2019-02-25 DIAGNOSIS — Z992 Dependence on renal dialysis: Secondary | ICD-10-CM | POA: Diagnosis not present

## 2019-02-25 DIAGNOSIS — D509 Iron deficiency anemia, unspecified: Secondary | ICD-10-CM | POA: Diagnosis not present

## 2019-02-25 DIAGNOSIS — E1129 Type 2 diabetes mellitus with other diabetic kidney complication: Secondary | ICD-10-CM | POA: Diagnosis not present

## 2019-02-28 DIAGNOSIS — Z992 Dependence on renal dialysis: Secondary | ICD-10-CM | POA: Diagnosis not present

## 2019-02-28 DIAGNOSIS — N2581 Secondary hyperparathyroidism of renal origin: Secondary | ICD-10-CM | POA: Diagnosis not present

## 2019-02-28 DIAGNOSIS — D509 Iron deficiency anemia, unspecified: Secondary | ICD-10-CM | POA: Diagnosis not present

## 2019-02-28 DIAGNOSIS — E1129 Type 2 diabetes mellitus with other diabetic kidney complication: Secondary | ICD-10-CM | POA: Diagnosis not present

## 2019-02-28 DIAGNOSIS — N186 End stage renal disease: Secondary | ICD-10-CM | POA: Diagnosis not present

## 2019-03-02 DIAGNOSIS — N186 End stage renal disease: Secondary | ICD-10-CM | POA: Diagnosis not present

## 2019-03-02 DIAGNOSIS — E1129 Type 2 diabetes mellitus with other diabetic kidney complication: Secondary | ICD-10-CM | POA: Diagnosis not present

## 2019-03-02 DIAGNOSIS — D509 Iron deficiency anemia, unspecified: Secondary | ICD-10-CM | POA: Diagnosis not present

## 2019-03-02 DIAGNOSIS — N2581 Secondary hyperparathyroidism of renal origin: Secondary | ICD-10-CM | POA: Diagnosis not present

## 2019-03-02 DIAGNOSIS — Z992 Dependence on renal dialysis: Secondary | ICD-10-CM | POA: Diagnosis not present

## 2019-03-04 DIAGNOSIS — N2581 Secondary hyperparathyroidism of renal origin: Secondary | ICD-10-CM | POA: Diagnosis not present

## 2019-03-04 DIAGNOSIS — E1129 Type 2 diabetes mellitus with other diabetic kidney complication: Secondary | ICD-10-CM | POA: Diagnosis not present

## 2019-03-04 DIAGNOSIS — N186 End stage renal disease: Secondary | ICD-10-CM | POA: Diagnosis not present

## 2019-03-04 DIAGNOSIS — D509 Iron deficiency anemia, unspecified: Secondary | ICD-10-CM | POA: Diagnosis not present

## 2019-03-04 DIAGNOSIS — Z992 Dependence on renal dialysis: Secondary | ICD-10-CM | POA: Diagnosis not present

## 2019-03-07 DIAGNOSIS — N186 End stage renal disease: Secondary | ICD-10-CM | POA: Diagnosis not present

## 2019-03-07 DIAGNOSIS — E1129 Type 2 diabetes mellitus with other diabetic kidney complication: Secondary | ICD-10-CM | POA: Diagnosis not present

## 2019-03-07 DIAGNOSIS — N2581 Secondary hyperparathyroidism of renal origin: Secondary | ICD-10-CM | POA: Diagnosis not present

## 2019-03-07 DIAGNOSIS — Z992 Dependence on renal dialysis: Secondary | ICD-10-CM | POA: Diagnosis not present

## 2019-03-07 DIAGNOSIS — D509 Iron deficiency anemia, unspecified: Secondary | ICD-10-CM | POA: Diagnosis not present

## 2019-03-09 DIAGNOSIS — D509 Iron deficiency anemia, unspecified: Secondary | ICD-10-CM | POA: Diagnosis not present

## 2019-03-09 DIAGNOSIS — N2581 Secondary hyperparathyroidism of renal origin: Secondary | ICD-10-CM | POA: Diagnosis not present

## 2019-03-09 DIAGNOSIS — E1129 Type 2 diabetes mellitus with other diabetic kidney complication: Secondary | ICD-10-CM | POA: Diagnosis not present

## 2019-03-09 DIAGNOSIS — N186 End stage renal disease: Secondary | ICD-10-CM | POA: Diagnosis not present

## 2019-03-09 DIAGNOSIS — Z992 Dependence on renal dialysis: Secondary | ICD-10-CM | POA: Diagnosis not present

## 2019-03-11 DIAGNOSIS — N2581 Secondary hyperparathyroidism of renal origin: Secondary | ICD-10-CM | POA: Diagnosis not present

## 2019-03-11 DIAGNOSIS — N186 End stage renal disease: Secondary | ICD-10-CM | POA: Diagnosis not present

## 2019-03-11 DIAGNOSIS — Z992 Dependence on renal dialysis: Secondary | ICD-10-CM | POA: Diagnosis not present

## 2019-03-11 DIAGNOSIS — D509 Iron deficiency anemia, unspecified: Secondary | ICD-10-CM | POA: Diagnosis not present

## 2019-03-11 DIAGNOSIS — E1129 Type 2 diabetes mellitus with other diabetic kidney complication: Secondary | ICD-10-CM | POA: Diagnosis not present

## 2019-03-14 DIAGNOSIS — D509 Iron deficiency anemia, unspecified: Secondary | ICD-10-CM | POA: Diagnosis not present

## 2019-03-14 DIAGNOSIS — N2581 Secondary hyperparathyroidism of renal origin: Secondary | ICD-10-CM | POA: Diagnosis not present

## 2019-03-14 DIAGNOSIS — E1129 Type 2 diabetes mellitus with other diabetic kidney complication: Secondary | ICD-10-CM | POA: Diagnosis not present

## 2019-03-14 DIAGNOSIS — N186 End stage renal disease: Secondary | ICD-10-CM | POA: Diagnosis not present

## 2019-03-14 DIAGNOSIS — Z992 Dependence on renal dialysis: Secondary | ICD-10-CM | POA: Diagnosis not present

## 2019-03-16 DIAGNOSIS — N186 End stage renal disease: Secondary | ICD-10-CM | POA: Diagnosis not present

## 2019-03-16 DIAGNOSIS — E1129 Type 2 diabetes mellitus with other diabetic kidney complication: Secondary | ICD-10-CM | POA: Diagnosis not present

## 2019-03-16 DIAGNOSIS — Z992 Dependence on renal dialysis: Secondary | ICD-10-CM | POA: Diagnosis not present

## 2019-03-16 DIAGNOSIS — N2581 Secondary hyperparathyroidism of renal origin: Secondary | ICD-10-CM | POA: Diagnosis not present

## 2019-03-16 DIAGNOSIS — D509 Iron deficiency anemia, unspecified: Secondary | ICD-10-CM | POA: Diagnosis not present

## 2019-03-18 DIAGNOSIS — N186 End stage renal disease: Secondary | ICD-10-CM | POA: Diagnosis not present

## 2019-03-18 DIAGNOSIS — E1129 Type 2 diabetes mellitus with other diabetic kidney complication: Secondary | ICD-10-CM | POA: Diagnosis not present

## 2019-03-18 DIAGNOSIS — Z992 Dependence on renal dialysis: Secondary | ICD-10-CM | POA: Diagnosis not present

## 2019-03-18 DIAGNOSIS — D509 Iron deficiency anemia, unspecified: Secondary | ICD-10-CM | POA: Diagnosis not present

## 2019-03-18 DIAGNOSIS — N2581 Secondary hyperparathyroidism of renal origin: Secondary | ICD-10-CM | POA: Diagnosis not present

## 2019-03-21 DIAGNOSIS — E1129 Type 2 diabetes mellitus with other diabetic kidney complication: Secondary | ICD-10-CM | POA: Diagnosis not present

## 2019-03-21 DIAGNOSIS — N2581 Secondary hyperparathyroidism of renal origin: Secondary | ICD-10-CM | POA: Diagnosis not present

## 2019-03-21 DIAGNOSIS — Z992 Dependence on renal dialysis: Secondary | ICD-10-CM | POA: Diagnosis not present

## 2019-03-21 DIAGNOSIS — N186 End stage renal disease: Secondary | ICD-10-CM | POA: Diagnosis not present

## 2019-03-21 DIAGNOSIS — D631 Anemia in chronic kidney disease: Secondary | ICD-10-CM | POA: Diagnosis not present

## 2019-03-23 DIAGNOSIS — N186 End stage renal disease: Secondary | ICD-10-CM | POA: Diagnosis not present

## 2019-03-23 DIAGNOSIS — D631 Anemia in chronic kidney disease: Secondary | ICD-10-CM | POA: Diagnosis not present

## 2019-03-23 DIAGNOSIS — E1129 Type 2 diabetes mellitus with other diabetic kidney complication: Secondary | ICD-10-CM | POA: Diagnosis not present

## 2019-03-23 DIAGNOSIS — Z992 Dependence on renal dialysis: Secondary | ICD-10-CM | POA: Diagnosis not present

## 2019-03-23 DIAGNOSIS — N2581 Secondary hyperparathyroidism of renal origin: Secondary | ICD-10-CM | POA: Diagnosis not present

## 2019-03-25 DIAGNOSIS — D631 Anemia in chronic kidney disease: Secondary | ICD-10-CM | POA: Diagnosis not present

## 2019-03-25 DIAGNOSIS — N186 End stage renal disease: Secondary | ICD-10-CM | POA: Diagnosis not present

## 2019-03-25 DIAGNOSIS — Z992 Dependence on renal dialysis: Secondary | ICD-10-CM | POA: Diagnosis not present

## 2019-03-25 DIAGNOSIS — N2581 Secondary hyperparathyroidism of renal origin: Secondary | ICD-10-CM | POA: Diagnosis not present

## 2019-03-25 DIAGNOSIS — E1129 Type 2 diabetes mellitus with other diabetic kidney complication: Secondary | ICD-10-CM | POA: Diagnosis not present

## 2019-03-28 DIAGNOSIS — Z992 Dependence on renal dialysis: Secondary | ICD-10-CM | POA: Diagnosis not present

## 2019-03-28 DIAGNOSIS — N186 End stage renal disease: Secondary | ICD-10-CM | POA: Diagnosis not present

## 2019-03-28 DIAGNOSIS — N2581 Secondary hyperparathyroidism of renal origin: Secondary | ICD-10-CM | POA: Diagnosis not present

## 2019-03-28 DIAGNOSIS — D631 Anemia in chronic kidney disease: Secondary | ICD-10-CM | POA: Diagnosis not present

## 2019-03-28 DIAGNOSIS — E1129 Type 2 diabetes mellitus with other diabetic kidney complication: Secondary | ICD-10-CM | POA: Diagnosis not present

## 2019-03-30 DIAGNOSIS — Z992 Dependence on renal dialysis: Secondary | ICD-10-CM | POA: Diagnosis not present

## 2019-03-30 DIAGNOSIS — N186 End stage renal disease: Secondary | ICD-10-CM | POA: Diagnosis not present

## 2019-03-30 DIAGNOSIS — N2581 Secondary hyperparathyroidism of renal origin: Secondary | ICD-10-CM | POA: Diagnosis not present

## 2019-03-30 DIAGNOSIS — D631 Anemia in chronic kidney disease: Secondary | ICD-10-CM | POA: Diagnosis not present

## 2019-03-30 DIAGNOSIS — E1129 Type 2 diabetes mellitus with other diabetic kidney complication: Secondary | ICD-10-CM | POA: Diagnosis not present

## 2019-04-01 DIAGNOSIS — D631 Anemia in chronic kidney disease: Secondary | ICD-10-CM | POA: Diagnosis not present

## 2019-04-01 DIAGNOSIS — N2581 Secondary hyperparathyroidism of renal origin: Secondary | ICD-10-CM | POA: Diagnosis not present

## 2019-04-01 DIAGNOSIS — N186 End stage renal disease: Secondary | ICD-10-CM | POA: Diagnosis not present

## 2019-04-01 DIAGNOSIS — Z992 Dependence on renal dialysis: Secondary | ICD-10-CM | POA: Diagnosis not present

## 2019-04-01 DIAGNOSIS — E1129 Type 2 diabetes mellitus with other diabetic kidney complication: Secondary | ICD-10-CM | POA: Diagnosis not present

## 2019-04-04 DIAGNOSIS — N186 End stage renal disease: Secondary | ICD-10-CM | POA: Diagnosis not present

## 2019-04-04 DIAGNOSIS — D631 Anemia in chronic kidney disease: Secondary | ICD-10-CM | POA: Diagnosis not present

## 2019-04-04 DIAGNOSIS — N2581 Secondary hyperparathyroidism of renal origin: Secondary | ICD-10-CM | POA: Diagnosis not present

## 2019-04-04 DIAGNOSIS — E1129 Type 2 diabetes mellitus with other diabetic kidney complication: Secondary | ICD-10-CM | POA: Diagnosis not present

## 2019-04-04 DIAGNOSIS — Z992 Dependence on renal dialysis: Secondary | ICD-10-CM | POA: Diagnosis not present

## 2019-04-06 DIAGNOSIS — N186 End stage renal disease: Secondary | ICD-10-CM | POA: Diagnosis not present

## 2019-04-06 DIAGNOSIS — D631 Anemia in chronic kidney disease: Secondary | ICD-10-CM | POA: Diagnosis not present

## 2019-04-06 DIAGNOSIS — Z992 Dependence on renal dialysis: Secondary | ICD-10-CM | POA: Diagnosis not present

## 2019-04-06 DIAGNOSIS — N2581 Secondary hyperparathyroidism of renal origin: Secondary | ICD-10-CM | POA: Diagnosis not present

## 2019-04-06 DIAGNOSIS — E1129 Type 2 diabetes mellitus with other diabetic kidney complication: Secondary | ICD-10-CM | POA: Diagnosis not present

## 2019-04-08 DIAGNOSIS — N2581 Secondary hyperparathyroidism of renal origin: Secondary | ICD-10-CM | POA: Diagnosis not present

## 2019-04-08 DIAGNOSIS — Z992 Dependence on renal dialysis: Secondary | ICD-10-CM | POA: Diagnosis not present

## 2019-04-08 DIAGNOSIS — D631 Anemia in chronic kidney disease: Secondary | ICD-10-CM | POA: Diagnosis not present

## 2019-04-08 DIAGNOSIS — N186 End stage renal disease: Secondary | ICD-10-CM | POA: Diagnosis not present

## 2019-04-08 DIAGNOSIS — E1129 Type 2 diabetes mellitus with other diabetic kidney complication: Secondary | ICD-10-CM | POA: Diagnosis not present

## 2019-04-11 DIAGNOSIS — E1129 Type 2 diabetes mellitus with other diabetic kidney complication: Secondary | ICD-10-CM | POA: Diagnosis not present

## 2019-04-11 DIAGNOSIS — D631 Anemia in chronic kidney disease: Secondary | ICD-10-CM | POA: Diagnosis not present

## 2019-04-11 DIAGNOSIS — Z992 Dependence on renal dialysis: Secondary | ICD-10-CM | POA: Diagnosis not present

## 2019-04-11 DIAGNOSIS — N186 End stage renal disease: Secondary | ICD-10-CM | POA: Diagnosis not present

## 2019-04-11 DIAGNOSIS — N2581 Secondary hyperparathyroidism of renal origin: Secondary | ICD-10-CM | POA: Diagnosis not present

## 2019-04-13 DIAGNOSIS — E1129 Type 2 diabetes mellitus with other diabetic kidney complication: Secondary | ICD-10-CM | POA: Diagnosis not present

## 2019-04-13 DIAGNOSIS — Z992 Dependence on renal dialysis: Secondary | ICD-10-CM | POA: Diagnosis not present

## 2019-04-13 DIAGNOSIS — N2581 Secondary hyperparathyroidism of renal origin: Secondary | ICD-10-CM | POA: Diagnosis not present

## 2019-04-13 DIAGNOSIS — N186 End stage renal disease: Secondary | ICD-10-CM | POA: Diagnosis not present

## 2019-04-13 DIAGNOSIS — D631 Anemia in chronic kidney disease: Secondary | ICD-10-CM | POA: Diagnosis not present

## 2019-04-15 ENCOUNTER — Telehealth: Payer: Self-pay

## 2019-04-15 DIAGNOSIS — N2581 Secondary hyperparathyroidism of renal origin: Secondary | ICD-10-CM | POA: Diagnosis not present

## 2019-04-15 DIAGNOSIS — E1129 Type 2 diabetes mellitus with other diabetic kidney complication: Secondary | ICD-10-CM | POA: Diagnosis not present

## 2019-04-15 DIAGNOSIS — N186 End stage renal disease: Secondary | ICD-10-CM | POA: Diagnosis not present

## 2019-04-15 DIAGNOSIS — D631 Anemia in chronic kidney disease: Secondary | ICD-10-CM | POA: Diagnosis not present

## 2019-04-15 DIAGNOSIS — Z992 Dependence on renal dialysis: Secondary | ICD-10-CM | POA: Diagnosis not present

## 2019-04-15 NOTE — Telephone Encounter (Signed)
[place] [phone], SENT TO [facility]

## 2019-04-18 DIAGNOSIS — N2581 Secondary hyperparathyroidism of renal origin: Secondary | ICD-10-CM | POA: Diagnosis not present

## 2019-04-18 DIAGNOSIS — E1129 Type 2 diabetes mellitus with other diabetic kidney complication: Secondary | ICD-10-CM | POA: Diagnosis not present

## 2019-04-18 DIAGNOSIS — Z992 Dependence on renal dialysis: Secondary | ICD-10-CM | POA: Diagnosis not present

## 2019-04-18 DIAGNOSIS — N186 End stage renal disease: Secondary | ICD-10-CM | POA: Diagnosis not present

## 2019-04-18 DIAGNOSIS — D631 Anemia in chronic kidney disease: Secondary | ICD-10-CM | POA: Diagnosis not present

## 2019-04-20 DIAGNOSIS — E1129 Type 2 diabetes mellitus with other diabetic kidney complication: Secondary | ICD-10-CM | POA: Diagnosis not present

## 2019-04-20 DIAGNOSIS — Z992 Dependence on renal dialysis: Secondary | ICD-10-CM | POA: Diagnosis not present

## 2019-04-20 DIAGNOSIS — N186 End stage renal disease: Secondary | ICD-10-CM | POA: Diagnosis not present

## 2019-04-20 DIAGNOSIS — D509 Iron deficiency anemia, unspecified: Secondary | ICD-10-CM | POA: Diagnosis not present

## 2019-04-20 DIAGNOSIS — D631 Anemia in chronic kidney disease: Secondary | ICD-10-CM | POA: Diagnosis not present

## 2019-04-20 DIAGNOSIS — N2581 Secondary hyperparathyroidism of renal origin: Secondary | ICD-10-CM | POA: Diagnosis not present

## 2019-04-22 DIAGNOSIS — Z992 Dependence on renal dialysis: Secondary | ICD-10-CM | POA: Diagnosis not present

## 2019-04-22 DIAGNOSIS — N2581 Secondary hyperparathyroidism of renal origin: Secondary | ICD-10-CM | POA: Diagnosis not present

## 2019-04-22 DIAGNOSIS — D509 Iron deficiency anemia, unspecified: Secondary | ICD-10-CM | POA: Diagnosis not present

## 2019-04-22 DIAGNOSIS — N186 End stage renal disease: Secondary | ICD-10-CM | POA: Diagnosis not present

## 2019-04-22 DIAGNOSIS — D631 Anemia in chronic kidney disease: Secondary | ICD-10-CM | POA: Diagnosis not present

## 2019-04-22 DIAGNOSIS — E1129 Type 2 diabetes mellitus with other diabetic kidney complication: Secondary | ICD-10-CM | POA: Diagnosis not present

## 2019-04-25 DIAGNOSIS — N2581 Secondary hyperparathyroidism of renal origin: Secondary | ICD-10-CM | POA: Diagnosis not present

## 2019-04-25 DIAGNOSIS — D631 Anemia in chronic kidney disease: Secondary | ICD-10-CM | POA: Diagnosis not present

## 2019-04-25 DIAGNOSIS — D509 Iron deficiency anemia, unspecified: Secondary | ICD-10-CM | POA: Diagnosis not present

## 2019-04-25 DIAGNOSIS — N186 End stage renal disease: Secondary | ICD-10-CM | POA: Diagnosis not present

## 2019-04-25 DIAGNOSIS — E1129 Type 2 diabetes mellitus with other diabetic kidney complication: Secondary | ICD-10-CM | POA: Diagnosis not present

## 2019-04-25 DIAGNOSIS — Z992 Dependence on renal dialysis: Secondary | ICD-10-CM | POA: Diagnosis not present

## 2019-04-27 DIAGNOSIS — N2581 Secondary hyperparathyroidism of renal origin: Secondary | ICD-10-CM | POA: Diagnosis not present

## 2019-04-27 DIAGNOSIS — N186 End stage renal disease: Secondary | ICD-10-CM | POA: Diagnosis not present

## 2019-04-27 DIAGNOSIS — E1129 Type 2 diabetes mellitus with other diabetic kidney complication: Secondary | ICD-10-CM | POA: Diagnosis not present

## 2019-04-27 DIAGNOSIS — D509 Iron deficiency anemia, unspecified: Secondary | ICD-10-CM | POA: Diagnosis not present

## 2019-04-27 DIAGNOSIS — D631 Anemia in chronic kidney disease: Secondary | ICD-10-CM | POA: Diagnosis not present

## 2019-04-27 DIAGNOSIS — Z992 Dependence on renal dialysis: Secondary | ICD-10-CM | POA: Diagnosis not present

## 2019-04-29 DIAGNOSIS — N186 End stage renal disease: Secondary | ICD-10-CM | POA: Diagnosis not present

## 2019-04-29 DIAGNOSIS — N2581 Secondary hyperparathyroidism of renal origin: Secondary | ICD-10-CM | POA: Diagnosis not present

## 2019-04-29 DIAGNOSIS — D509 Iron deficiency anemia, unspecified: Secondary | ICD-10-CM | POA: Diagnosis not present

## 2019-04-29 DIAGNOSIS — Z992 Dependence on renal dialysis: Secondary | ICD-10-CM | POA: Diagnosis not present

## 2019-04-29 DIAGNOSIS — D631 Anemia in chronic kidney disease: Secondary | ICD-10-CM | POA: Diagnosis not present

## 2019-04-29 DIAGNOSIS — E1129 Type 2 diabetes mellitus with other diabetic kidney complication: Secondary | ICD-10-CM | POA: Diagnosis not present

## 2019-05-02 DIAGNOSIS — N186 End stage renal disease: Secondary | ICD-10-CM | POA: Diagnosis not present

## 2019-05-02 DIAGNOSIS — N2581 Secondary hyperparathyroidism of renal origin: Secondary | ICD-10-CM | POA: Diagnosis not present

## 2019-05-02 DIAGNOSIS — D509 Iron deficiency anemia, unspecified: Secondary | ICD-10-CM | POA: Diagnosis not present

## 2019-05-02 DIAGNOSIS — E1129 Type 2 diabetes mellitus with other diabetic kidney complication: Secondary | ICD-10-CM | POA: Diagnosis not present

## 2019-05-02 DIAGNOSIS — D631 Anemia in chronic kidney disease: Secondary | ICD-10-CM | POA: Diagnosis not present

## 2019-05-02 DIAGNOSIS — Z992 Dependence on renal dialysis: Secondary | ICD-10-CM | POA: Diagnosis not present

## 2019-05-04 DIAGNOSIS — N186 End stage renal disease: Secondary | ICD-10-CM | POA: Diagnosis not present

## 2019-05-04 DIAGNOSIS — N2581 Secondary hyperparathyroidism of renal origin: Secondary | ICD-10-CM | POA: Diagnosis not present

## 2019-05-04 DIAGNOSIS — Z992 Dependence on renal dialysis: Secondary | ICD-10-CM | POA: Diagnosis not present

## 2019-05-04 DIAGNOSIS — E1129 Type 2 diabetes mellitus with other diabetic kidney complication: Secondary | ICD-10-CM | POA: Diagnosis not present

## 2019-05-04 DIAGNOSIS — D509 Iron deficiency anemia, unspecified: Secondary | ICD-10-CM | POA: Diagnosis not present

## 2019-05-04 DIAGNOSIS — D631 Anemia in chronic kidney disease: Secondary | ICD-10-CM | POA: Diagnosis not present

## 2019-05-06 ENCOUNTER — Ambulatory Visit (INDEPENDENT_AMBULATORY_CARE_PROVIDER_SITE_OTHER): Payer: Medicare Other

## 2019-05-06 ENCOUNTER — Ambulatory Visit (INDEPENDENT_AMBULATORY_CARE_PROVIDER_SITE_OTHER): Payer: Medicare Other | Admitting: Podiatry

## 2019-05-06 ENCOUNTER — Other Ambulatory Visit: Payer: Self-pay | Admitting: Podiatry

## 2019-05-06 ENCOUNTER — Encounter: Payer: Self-pay | Admitting: Podiatry

## 2019-05-06 ENCOUNTER — Other Ambulatory Visit: Payer: Self-pay

## 2019-05-06 VITALS — BP 162/109 | Temp 99.5°F

## 2019-05-06 DIAGNOSIS — M25571 Pain in right ankle and joints of right foot: Secondary | ICD-10-CM

## 2019-05-06 DIAGNOSIS — M25371 Other instability, right ankle: Secondary | ICD-10-CM | POA: Diagnosis not present

## 2019-05-06 DIAGNOSIS — D509 Iron deficiency anemia, unspecified: Secondary | ICD-10-CM | POA: Diagnosis not present

## 2019-05-06 DIAGNOSIS — L97514 Non-pressure chronic ulcer of other part of right foot with necrosis of bone: Secondary | ICD-10-CM

## 2019-05-06 DIAGNOSIS — N186 End stage renal disease: Secondary | ICD-10-CM | POA: Diagnosis not present

## 2019-05-06 DIAGNOSIS — D631 Anemia in chronic kidney disease: Secondary | ICD-10-CM | POA: Diagnosis not present

## 2019-05-06 DIAGNOSIS — M8580 Other specified disorders of bone density and structure, unspecified site: Secondary | ICD-10-CM

## 2019-05-06 DIAGNOSIS — Z992 Dependence on renal dialysis: Secondary | ICD-10-CM | POA: Diagnosis not present

## 2019-05-06 DIAGNOSIS — M8610 Other acute osteomyelitis, unspecified site: Secondary | ICD-10-CM | POA: Diagnosis not present

## 2019-05-06 DIAGNOSIS — N2581 Secondary hyperparathyroidism of renal origin: Secondary | ICD-10-CM | POA: Diagnosis not present

## 2019-05-06 DIAGNOSIS — M869 Osteomyelitis, unspecified: Secondary | ICD-10-CM | POA: Diagnosis not present

## 2019-05-06 DIAGNOSIS — E1129 Type 2 diabetes mellitus with other diabetic kidney complication: Secondary | ICD-10-CM | POA: Diagnosis not present

## 2019-05-06 DIAGNOSIS — M86171 Other acute osteomyelitis, right ankle and foot: Secondary | ICD-10-CM | POA: Diagnosis not present

## 2019-05-06 MED ORDER — CLINDAMYCIN HCL 300 MG PO CAPS: 300.0000 mg | ORAL_CAPSULE | Freq: Two times a day (BID) | ORAL | 0 refills | Status: DC

## 2019-05-06 NOTE — Patient Instructions (Signed)
Pre-Operative Instructions  Congratulations, you have decided to take an important step towards improving your quality of life.  You can be assured that the doctors and staff at [facility] will be with you every step of the way.  Here are some important things you should know:  1. Plan to be at the surgery center/hospital at least 1 (one) hour prior to your scheduled time, unless otherwise directed by the surgical center/hospital staff.  You must have a responsible adult accompany you, remain during the surgery and drive you home.  Make sure you have directions to the surgical center/hospital to ensure you arrive on time. 2. If you are having surgery at [facility] or [facility] hospitals, you will need a copy of your medical history and physical form from your family physician within one month prior to the date of surgery. We will give you a form for your primary physician to complete.  3. We make every effort to accommodate the date you request for surgery.  However, there are times where surgery dates or times have to be moved.  We will contact you as soon as possible if a change in schedule is required.   4. No aspirin/ibuprofen for one week before surgery.  If you are on aspirin, any non-steroidal anti-inflammatory medications (Mobic, Aleve, Ibuprofen) should not be taken seven (7) days prior to your surgery.  You make take Tylenol for pain prior to surgery.  5. Medications - If you are taking daily heart and blood pressure medications, seizure, reflux, allergy, asthma, anxiety, pain or diabetes medications, make sure you notify the surgery center/hospital before the day of surgery so they can tell you which medications you should take or avoid the day of surgery. 6. No food or drink after midnight the night before surgery unless directed otherwise by surgical center/hospital staff. 7. No alcoholic beverages 24-hours prior to surgery.  No smoking 24-hours prior or 24-hours after  surgery. 8. Wear loose pants or shorts. They should be loose enough to fit over bandages, boots, and casts. 9. Don't wear slip-on shoes. Sneakers are preferred. 10. Bring your boot with you to the surgery center/hospital.  Also bring crutches or a walker if your physician has prescribed it for you.  If you do not have this equipment, it will be provided for you after surgery. 11. If you have not been contacted by the surgery center/hospital by the day before your surgery, call to confirm the date and time of your surgery. 12. Leave-time from work may vary depending on the type of surgery you have.  Appropriate arrangements should be made prior to surgery with your employer. 13. Prescriptions will be provided immediately following surgery by your doctor.  Fill these as soon as possible after surgery and take the medication as directed. Pain medications will not be refilled on weekends and must be approved by the doctor. 14. Remove nail polish on the operative foot and avoid getting pedicures prior to surgery. 15. Wash the night before surgery.  The night before surgery wash the foot and leg well with water and the antibacterial soap provided. Be sure to pay special attention to beneath the toenails and in between the toes.  Wash for at least three (3) minutes. Rinse thoroughly with water and dry well with a towel.  Perform this wash unless told not to do so by your physician.  Enclosed: 1 Ice pack (please put in freezer the night before surgery)   1 Hibiclens skin cleaner     Pre-op instructions  If you have any questions regarding the instructions, please do not hesitate to call our office.  [place]: [address], [place], [place] [address] -- [phone]  [place]: [address] [place], [place] [address] -- [phone]  [place]: [address]  [place], [place] [address] -- [phone]  [place]: [address], [address], [place], [place] [address] -- [phone]  Website:  [email or web address] 

## 2019-05-06 NOTE — Progress Notes (Signed)
Subjective:  Patient ID: [name], male    DOB: [birth year],  MRN: [id number]  No chief complaint on file.  53 y.o. male presents for wound care.  Reports history of wounds to the right great toe and second toe states that the wound started from injury sustained while mowing his grass.  Occurred about 2 weeks ago.  Has been treating the wounds himself.  Review of Systems: Negative except as noted in the HPI. Denies N/V/F/Ch.  Past Medical History:  Diagnosis Date  . Chills    at night - sometimes  . Complication of anesthesia   . Diabetes mellitus    controlled by diet  . 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 Eczema   . Fatigue    loss of fatigue  . Headache(784.0)    Years ago  . Heart murmur    years ago  . History of blood transfusion   . Hypertension    sees Dr. Jaci Standard  . MRSA (methicillin resistant staph aureus) culture positive   . PONV (postoperative nausea and vomiting)   . Poor circulation   . Renal disorder     Current Outpatient Medications:  .  blood glucose meter kit and supplies, Dispense based on patient and insurance preference. Use up to four times daily as directed. (FOR ICD-9 250.00, 250.01)., Disp: 1 each, Rfl: 0 .  glucose blood (ACCU-CHEK AVIVA PLUS) test strip, Use as instructed, Disp: 100 each, Rfl: 12 .  Lancet Devices (ACCU-CHEK SOFTCLIX) lancets, Use as instructed, Disp: 100 each, Rfl: 5 .  lidocaine (XYLOCAINE) 2 % jelly, Apply 1 application topically as needed. (Patient not taking: Reported on 05/12/2019), Disp: 30 mL, Rfl: 1 .  acetaminophen (TYLENOL) 500 MG tablet, Take 1,000 mg by mouth every 6 (six) hours as needed for moderate pain or headache., Disp: , Rfl:  .  clindamycin (CLEOCIN) 300 MG capsule, Take 1 capsule (300 mg total) by mouth 2 (two) times daily., Disp: 14 capsule, Rfl: 0 .  ferric citrate (AURYXIA) 1 GM 210 MG(Fe) tablet, Take 210-630 mg by mouth See admin instructions. Take 420 to 630 mg with each meal  depending on the size of the meal, and take 210 mg with each snack, Disp: , Rfl:  .  loratadine (CLARITIN) 10 MG tablet, Take 10 mg by mouth daily as needed for allergies., Disp: , Rfl:  .  Melatonin 5 MG CAPS, Take 10 mg by mouth at bedtime as needed (sleep)., Disp: , Rfl:  .  multivitamin (RENA-VIT) TABS tablet, Take 1 tablet by mouth every Monday, Wednesday, and Friday. After dialysis, Disp: , Rfl:   Social History   Tobacco Use  Smoking Status Never Smoker  Smokeless Tobacco Never Used    Allergies  Allergen Reactions  . Amoxicillin Itching    Did it involve swelling of the face/tongue/throat, SOB, or low BP? Unknown Did it involve sudden or severe rash/hives, skin peeling, or any reaction on the inside of your mouth or nose? Unknown Did you need to seek medical attention at a hospital or doctor's office? Unknown When did it last happen?20 years ago If all above answers are "NO", may proceed with cephalosporin use.   Marland Kitchen Hydromorphone Itching    Patient states he may take with benadryl. Makes feet itch.  Marland Kitchen Percocet [Oxycodone-Acetaminophen] Itching and Other (See Comments)    Legs only. Itching and burning feeling.   Objective:   Vitals:   05/06/19 1612  BP: (!) 162/109  Temp: 99.5 F (37.5  C)   There is no height or weight on file to calculate BMI. Constitutional Well developed. Well nourished.  Vascular Dorsalis pedis pulses palpable bilaterally. Posterior tibial pulses palpable bilaterally. Capillary refill normal to all digits.  No cyanosis or clubbing noted. Pedal hair growth normal.  Neurologic Normal speech. Oriented to person, place, and time. Protective sensation absent  Dermatologic Right second toe with distal ulcer with probe to soft bone.   Right hallux with plantar wound without probe to bone granular base with hyperkeratotic rim measuring 3 x 0.5.  Medial interphalangeal hyperkeratosis without open ulcer upon debridement.  Ingrown nail medial  aspect of the right hallux without signs of acute infection. Fluctuance noted at the central aspect of the distal phalanx.  Right hallux without warmth erythema signs of acute infection.  Orthopedic: No pain to palpation either foot. Pain to palpation about the right ATFL   Radiographs: Taken and reviewed.  Osteolysis of the distal phalanx of the hallux noted.  Osteolysis of the distal tuft of the second toe distal phalanx Assessment:   1. Right ankle instability   2. Skin ulcer of second toe of right foot with necrosis of bone ([place])   3. Osteomyelitis of great toe of right foot ([place])   4. Acute osteomyelitis of toe, right ([place])   5. Bone erosion determined by x-ray    Plan:  Patient was evaluated and treated and all questions answered.  Ulcer right second toe with acute osteomyelitis, ulcer right hallux with chronic osteomyelitis -X-rays reviewed as above -Following sterile skin prep with Betadine, the wound was debrided down to level of bone.  Post debridement the wound measured 1 x 1 x 0.5.  The distal phalanx bone was debrided as well with a tisuse nipper.  The distal phalanx bone was soft. -Order [facility] Wound care. Betadine WTD daily to the wound thrice weekly. -Will plan for surgery for partial amputation of the right second toe. Will plan for concomitant distal phalanx biopsy of the right great toe given lysis noted on XR.  -Though radiographically the right great toe appears severely lysed clinically the toe is without acute signs of infection.  Likely quiescent however given recent history concern for possible recurrence.  Will benefit from bone biopsy with plan for antibiotic therapy rather than amputation.  For the second toe however, given the exposed bone today amputation is indicated.  We will plan for the procedure performed follow-up next week to discuss the procedure -Immobilized in walking boot  Procedure: Excisional Debridement of Wound Rationale: Removal of non-viable soft  tissue from the wound to promote healing.  Anesthesia: none Pre-Debridement Wound Measurements: 0.5 cm x 0.5 cm x 0.5 cm  Post-Debridement Wound Measurements: 1 cm x 1 cm x 0.5 cm  Type of Debridement: Sharp Excisional Tissue Removed: Non-viable soft tissue Depth of Debridement: Bone Technique: Sharp excisional debridement to bleeding, viable wound base.  Dressing: Dry, sterile, compression dressing. Disposition: Patient tolerated procedure well. Patient to return in 1 week for follow-up.  Right ankle insufficiency -Pedal patient with a right ATFL.  Will immobilize in cam walker boot  Return in about 1 week (around 05/13/2019).

## 2019-05-08 ENCOUNTER — Telehealth: Payer: Self-pay | Admitting: Podiatry

## 2019-05-08 NOTE — Telephone Encounter (Signed)
Called to check on patient. States dressing came off foot.   States [facility] did not come yesterday and he has not heard from them. Will check in on status Will order IV Abx with HD. Culture not back yet so we can continue POs until results return.

## 2019-05-09 ENCOUNTER — Other Ambulatory Visit: Payer: Self-pay | Admitting: Podiatry

## 2019-05-09 DIAGNOSIS — I120 Hypertensive chronic kidney disease with stage 5 chronic kidney disease or end stage renal disease: Secondary | ICD-10-CM | POA: Diagnosis not present

## 2019-05-09 DIAGNOSIS — N2581 Secondary hyperparathyroidism of renal origin: Secondary | ICD-10-CM | POA: Diagnosis not present

## 2019-05-09 DIAGNOSIS — L97514 Non-pressure chronic ulcer of other part of right foot with necrosis of bone: Secondary | ICD-10-CM

## 2019-05-09 DIAGNOSIS — M86071 Acute hematogenous osteomyelitis, right ankle and foot: Secondary | ICD-10-CM | POA: Diagnosis not present

## 2019-05-09 DIAGNOSIS — Z992 Dependence on renal dialysis: Secondary | ICD-10-CM | POA: Diagnosis not present

## 2019-05-09 DIAGNOSIS — Z794 Long term (current) use of insulin: Secondary | ICD-10-CM | POA: Diagnosis not present

## 2019-05-09 DIAGNOSIS — E1129 Type 2 diabetes mellitus with other diabetic kidney complication: Secondary | ICD-10-CM | POA: Diagnosis not present

## 2019-05-09 DIAGNOSIS — L97516 Non-pressure chronic ulcer of other part of right foot with bone involvement without evidence of necrosis: Secondary | ICD-10-CM | POA: Diagnosis not present

## 2019-05-09 DIAGNOSIS — E1122 Type 2 diabetes mellitus with diabetic chronic kidney disease: Secondary | ICD-10-CM | POA: Diagnosis not present

## 2019-05-09 DIAGNOSIS — D509 Iron deficiency anemia, unspecified: Secondary | ICD-10-CM | POA: Diagnosis not present

## 2019-05-09 DIAGNOSIS — Z4800 Encounter for change or removal of nonsurgical wound dressing: Secondary | ICD-10-CM | POA: Diagnosis not present

## 2019-05-09 DIAGNOSIS — E1169 Type 2 diabetes mellitus with other specified complication: Secondary | ICD-10-CM | POA: Diagnosis not present

## 2019-05-09 DIAGNOSIS — N186 End stage renal disease: Secondary | ICD-10-CM | POA: Diagnosis not present

## 2019-05-09 DIAGNOSIS — E11621 Type 2 diabetes mellitus with foot ulcer: Secondary | ICD-10-CM | POA: Diagnosis not present

## 2019-05-09 DIAGNOSIS — D631 Anemia in chronic kidney disease: Secondary | ICD-10-CM | POA: Diagnosis not present

## 2019-05-09 LAB — WOUND CULTURE
MICRO NUMBER:: 680463
SPECIMEN QUALITY:: ADEQUATE

## 2019-05-09 LAB — CLIENT EDUCATION TRACKING

## 2019-05-10 ENCOUNTER — Telehealth: Payer: Self-pay

## 2019-05-10 LAB — PATHOLOGY REPORT

## 2019-05-10 LAB — TISSUE SPECIMEN

## 2019-05-10 NOTE — Telephone Encounter (Signed)
Pt. Called stating he is out of wound supplies and [facility] are coming until tomorrow. I advised the patient to come in the office to pick up the supplies he needs for his dressing change today. Also, patient states [facility] needs an order for wound supplies from Dr. March Rummage.

## 2019-05-11 ENCOUNTER — Telehealth: Payer: Self-pay | Admitting: *Deleted

## 2019-05-11 DIAGNOSIS — N186 End stage renal disease: Secondary | ICD-10-CM | POA: Diagnosis not present

## 2019-05-11 DIAGNOSIS — Z992 Dependence on renal dialysis: Secondary | ICD-10-CM | POA: Diagnosis not present

## 2019-05-11 DIAGNOSIS — M86071 Acute hematogenous osteomyelitis, right ankle and foot: Secondary | ICD-10-CM | POA: Diagnosis not present

## 2019-05-11 DIAGNOSIS — E1169 Type 2 diabetes mellitus with other specified complication: Secondary | ICD-10-CM | POA: Diagnosis not present

## 2019-05-11 DIAGNOSIS — D509 Iron deficiency anemia, unspecified: Secondary | ICD-10-CM | POA: Diagnosis not present

## 2019-05-11 DIAGNOSIS — Z4800 Encounter for change or removal of nonsurgical wound dressing: Secondary | ICD-10-CM | POA: Diagnosis not present

## 2019-05-11 DIAGNOSIS — E1129 Type 2 diabetes mellitus with other diabetic kidney complication: Secondary | ICD-10-CM | POA: Diagnosis not present

## 2019-05-11 DIAGNOSIS — N2581 Secondary hyperparathyroidism of renal origin: Secondary | ICD-10-CM | POA: Diagnosis not present

## 2019-05-11 DIAGNOSIS — L97516 Non-pressure chronic ulcer of other part of right foot with bone involvement without evidence of necrosis: Secondary | ICD-10-CM | POA: Diagnosis not present

## 2019-05-11 DIAGNOSIS — Z01818 Encounter for other preprocedural examination: Secondary | ICD-10-CM

## 2019-05-11 DIAGNOSIS — D631 Anemia in chronic kidney disease: Secondary | ICD-10-CM | POA: Diagnosis not present

## 2019-05-11 DIAGNOSIS — E1122 Type 2 diabetes mellitus with diabetic chronic kidney disease: Secondary | ICD-10-CM | POA: Diagnosis not present

## 2019-05-11 DIAGNOSIS — E11621 Type 2 diabetes mellitus with foot ulcer: Secondary | ICD-10-CM | POA: Diagnosis not present

## 2019-05-11 NOTE — Telephone Encounter (Signed)
I left him a message that his bag is at the front desk.  It has all the information in it.  I also informed him he must have a Covid test done on Friday.  I told him a pre-admission scheduler would call him to set up that appointment.

## 2019-05-11 NOTE — Telephone Encounter (Signed)
I am calling you in regards to your surgery.  Dr. March Rummage wants to know if Tues, July 28 will be okay.  "I don't see why it wouldn't.  I don't have dialysis that day."  Have you had a physical recently from your primary care doctor?  "My kidney doctor takes care of me."  Can you get him to complete your history and physical form?  "Yes, I can.  Where do I get the forms?"  You were not given the forms when you were here?  "I wasn't given anything but some soap and a gel pack.  I wasn't given anything about my surgery and no forms for [facility].  I was even there yesterday."  I'll leave the forms at the front desk for you.  I left a surgical kit, Covid-19 test instructions, and the history & physical forms at the front desk.

## 2019-05-12 ENCOUNTER — Telehealth: Payer: Self-pay | Admitting: *Deleted

## 2019-05-12 ENCOUNTER — Telehealth: Payer: Self-pay

## 2019-05-12 NOTE — Telephone Encounter (Signed)
[name] came by to pick up his surgical kit and forms.  He was not happy in regards to the lack of communication about his surgery.  He stated he was concerned about the whole process and was not feeling very secure about having Dr. March Rummage perform the surgery.  I reassured him that Dr. March Rummage would take care of him.  I apologized for the lack of communication from Dr. Eleanora Neighbor assistant for that day, for failing to give him the needed items and information to prepare for the surgery.  I told him I would address his concerns with Dr. March Rummage.  He's scheduled to have his physical on Monday.

## 2019-05-12 NOTE — Telephone Encounter (Addendum)
DOS 05/17/2019; 20220 - OPEN SUPERFICIAL BONE BIOPSY HALLUX LT. FOOT AND 11464 - AMPUTATION TOE INTERPHALANGEAL 2ND RT FOOT  [employer] MEDICARE: Effective Date - 09/19/2018 - 09/19/2019   Individual In-Network (Service Year) Deductible Deductible has been met  $0.00 remaining  $3,000.00 Plan Amt.   Out-of-Pocket Out-of-Pocket Maximum has been met  $0.00 remaining $7,900.00   This Passenger transport manager plan does not currently require a prior authorization for these services. If you have general questions about the prior authorization requirements, please call us at [phone] or visit [email or web address] > Clinician Resources > Advance and Admission Notification Requirements. The number above acknowledges your notification. Please write this number down for future reference. Notification is not a guarantee of coverage or payment.  Decision ID #:[id number]

## 2019-05-12 NOTE — Telephone Encounter (Signed)
Pt called stating he has a vein bulging from calf to hurt ankle and it's extremely sore. Please advise.

## 2019-05-12 NOTE — Telephone Encounter (Signed)
Called patient - will have him come in 4:15 tomorrow for follow-up

## 2019-05-12 NOTE — Telephone Encounter (Signed)
Can we have him come in tomorrow so we can check him out before surgery?

## 2019-05-13 ENCOUNTER — Ambulatory Visit (INDEPENDENT_AMBULATORY_CARE_PROVIDER_SITE_OTHER): Payer: Medicare Other | Admitting: Podiatry

## 2019-05-13 ENCOUNTER — Other Ambulatory Visit: Payer: Self-pay

## 2019-05-13 ENCOUNTER — Inpatient Hospital Stay (HOSPITAL_COMMUNITY)
Admission: EM | Admit: 2019-05-13 | Discharge: 2019-05-18 | DRG: 853 | Disposition: A | Discharge status: Home Health | Payer: Medicare Other | Attending: Internal Medicine | Admitting: Internal Medicine

## 2019-05-13 ENCOUNTER — Other Ambulatory Visit (HOSPITAL_COMMUNITY)
Admission: RE | Admit: 2019-05-13 | Discharge: 2019-05-13 | Disposition: A | Discharge status: Home / Self Care | Payer: Medicare Other | Source: Ambulatory Visit | Attending: Podiatry | Admitting: Podiatry

## 2019-05-13 VITALS — BP 194/103 | HR 100 | Temp 101.2°F

## 2019-05-13 DIAGNOSIS — K30 Functional dyspepsia: Secondary | ICD-10-CM | POA: Diagnosis not present

## 2019-05-13 DIAGNOSIS — I120 Hypertensive chronic kidney disease with stage 5 chronic kidney disease or end stage renal disease: Secondary | ICD-10-CM | POA: Diagnosis not present

## 2019-05-13 DIAGNOSIS — M86671 Other chronic osteomyelitis, right ankle and foot: Secondary | ICD-10-CM | POA: Diagnosis present

## 2019-05-13 DIAGNOSIS — E08621 Diabetes mellitus due to underlying condition with foot ulcer: Secondary | ICD-10-CM | POA: Diagnosis not present

## 2019-05-13 DIAGNOSIS — M869 Osteomyelitis, unspecified: Secondary | ICD-10-CM

## 2019-05-13 DIAGNOSIS — M86171 Other acute osteomyelitis, right ankle and foot: Secondary | ICD-10-CM | POA: Diagnosis not present

## 2019-05-13 DIAGNOSIS — Z8614 Personal history of Methicillin resistant Staphylococcus aureus infection: Secondary | ICD-10-CM

## 2019-05-13 DIAGNOSIS — Z881 Allergy status to other antibiotic agents status: Secondary | ICD-10-CM

## 2019-05-13 DIAGNOSIS — L97514 Non-pressure chronic ulcer of other part of right foot with necrosis of bone: Secondary | ICD-10-CM

## 2019-05-13 DIAGNOSIS — I10 Essential (primary) hypertension: Secondary | ICD-10-CM | POA: Diagnosis not present

## 2019-05-13 DIAGNOSIS — D509 Iron deficiency anemia, unspecified: Secondary | ICD-10-CM | POA: Diagnosis not present

## 2019-05-13 DIAGNOSIS — E669 Obesity, unspecified: Secondary | ICD-10-CM | POA: Diagnosis present

## 2019-05-13 DIAGNOSIS — Z6831 Body mass index (BMI) 31.0-31.9, adult: Secondary | ICD-10-CM | POA: Diagnosis not present

## 2019-05-13 DIAGNOSIS — I70203 Unspecified atherosclerosis of native arteries of extremities, bilateral legs: Secondary | ICD-10-CM | POA: Diagnosis present

## 2019-05-13 DIAGNOSIS — M79661 Pain in right lower leg: Secondary | ICD-10-CM | POA: Diagnosis present

## 2019-05-13 DIAGNOSIS — L0390 Cellulitis, unspecified: Secondary | ICD-10-CM | POA: Diagnosis not present

## 2019-05-13 DIAGNOSIS — E875 Hyperkalemia: Secondary | ICD-10-CM | POA: Diagnosis not present

## 2019-05-13 DIAGNOSIS — Z20828 Contact with and (suspected) exposure to other viral communicable diseases: Secondary | ICD-10-CM | POA: Diagnosis present

## 2019-05-13 DIAGNOSIS — A419 Sepsis, unspecified organism: Secondary | ICD-10-CM | POA: Diagnosis not present

## 2019-05-13 DIAGNOSIS — E785 Hyperlipidemia, unspecified: Secondary | ICD-10-CM | POA: Diagnosis present

## 2019-05-13 DIAGNOSIS — Z833 Family history of diabetes mellitus: Secondary | ICD-10-CM

## 2019-05-13 DIAGNOSIS — D631 Anemia in chronic kidney disease: Secondary | ICD-10-CM | POA: Diagnosis present

## 2019-05-13 DIAGNOSIS — K5900 Constipation, unspecified: Secondary | ICD-10-CM | POA: Diagnosis not present

## 2019-05-13 DIAGNOSIS — Z79899 Other long term (current) drug therapy: Secondary | ICD-10-CM

## 2019-05-13 DIAGNOSIS — E1152 Type 2 diabetes mellitus with diabetic peripheral angiopathy with gangrene: Secondary | ICD-10-CM | POA: Diagnosis not present

## 2019-05-13 DIAGNOSIS — Z880 Allergy status to penicillin: Secondary | ICD-10-CM

## 2019-05-13 DIAGNOSIS — D62 Acute posthemorrhagic anemia: Secondary | ICD-10-CM | POA: Diagnosis not present

## 2019-05-13 DIAGNOSIS — E11621 Type 2 diabetes mellitus with foot ulcer: Secondary | ICD-10-CM | POA: Diagnosis present

## 2019-05-13 DIAGNOSIS — E119 Type 2 diabetes mellitus without complications: Secondary | ICD-10-CM | POA: Diagnosis present

## 2019-05-13 DIAGNOSIS — L089 Local infection of the skin and subcutaneous tissue, unspecified: Secondary | ICD-10-CM | POA: Diagnosis not present

## 2019-05-13 DIAGNOSIS — M79609 Pain in unspecified limb: Secondary | ICD-10-CM | POA: Diagnosis not present

## 2019-05-13 DIAGNOSIS — S98921A Partial traumatic amputation of right foot, level unspecified, initial encounter: Secondary | ICD-10-CM | POA: Diagnosis not present

## 2019-05-13 DIAGNOSIS — N186 End stage renal disease: Secondary | ICD-10-CM | POA: Diagnosis not present

## 2019-05-13 DIAGNOSIS — I96 Gangrene, not elsewhere classified: Secondary | ICD-10-CM

## 2019-05-13 DIAGNOSIS — N2581 Secondary hyperparathyroidism of renal origin: Secondary | ICD-10-CM | POA: Diagnosis present

## 2019-05-13 DIAGNOSIS — B9620 Unspecified Escherichia coli [E. coli] as the cause of diseases classified elsewhere: Secondary | ICD-10-CM | POA: Diagnosis present

## 2019-05-13 DIAGNOSIS — L97509 Non-pressure chronic ulcer of other part of unspecified foot with unspecified severity: Secondary | ICD-10-CM | POA: Diagnosis present

## 2019-05-13 DIAGNOSIS — E1169 Type 2 diabetes mellitus with other specified complication: Secondary | ICD-10-CM | POA: Diagnosis present

## 2019-05-13 DIAGNOSIS — E1122 Type 2 diabetes mellitus with diabetic chronic kidney disease: Secondary | ICD-10-CM | POA: Diagnosis present

## 2019-05-13 DIAGNOSIS — E1129 Type 2 diabetes mellitus with other diabetic kidney complication: Secondary | ICD-10-CM | POA: Diagnosis not present

## 2019-05-13 DIAGNOSIS — Z03818 Encounter for observation for suspected exposure to other biological agents ruled out: Secondary | ICD-10-CM | POA: Diagnosis not present

## 2019-05-13 DIAGNOSIS — I82401 Acute embolism and thrombosis of unspecified deep veins of right lower extremity: Secondary | ICD-10-CM | POA: Diagnosis not present

## 2019-05-13 DIAGNOSIS — Z9889 Other specified postprocedural states: Secondary | ICD-10-CM

## 2019-05-13 DIAGNOSIS — M7989 Other specified soft tissue disorders: Secondary | ICD-10-CM | POA: Diagnosis not present

## 2019-05-13 DIAGNOSIS — M86071 Acute hematogenous osteomyelitis, right ankle and foot: Secondary | ICD-10-CM | POA: Diagnosis not present

## 2019-05-13 DIAGNOSIS — L97516 Non-pressure chronic ulcer of other part of right foot with bone involvement without evidence of necrosis: Secondary | ICD-10-CM | POA: Diagnosis not present

## 2019-05-13 DIAGNOSIS — Z4800 Encounter for change or removal of nonsurgical wound dressing: Secondary | ICD-10-CM | POA: Diagnosis not present

## 2019-05-13 DIAGNOSIS — Z992 Dependence on renal dialysis: Secondary | ICD-10-CM

## 2019-05-13 DIAGNOSIS — E11319 Type 2 diabetes mellitus with unspecified diabetic retinopathy without macular edema: Secondary | ICD-10-CM | POA: Diagnosis present

## 2019-05-13 DIAGNOSIS — Z1159 Encounter for screening for other viral diseases: Secondary | ICD-10-CM | POA: Insufficient documentation

## 2019-05-13 DIAGNOSIS — M868X7 Other osteomyelitis, ankle and foot: Secondary | ICD-10-CM | POA: Diagnosis not present

## 2019-05-13 DIAGNOSIS — Z885 Allergy status to narcotic agent status: Secondary | ICD-10-CM

## 2019-05-13 LAB — CBC WITH DIFFERENTIAL/PLATELET
Abs Immature Granulocytes: 0.03 10*3/uL (ref 0.00–0.07)
Basophils Absolute: 0 10*3/uL (ref 0.0–0.1)
Basophils Relative: 0 %
Eosinophils Absolute: 0 10*3/uL (ref 0.0–0.5)
Eosinophils Relative: 0 %
HCT: 28.7 % — ABNORMAL LOW (ref 39.0–52.0)
Hemoglobin: 8.6 g/dL — ABNORMAL LOW (ref 13.0–17.0)
Immature Granulocytes: 0 %
Lymphocytes Relative: 10 %
Lymphs Abs: 1 10*3/uL (ref 0.7–4.0)
MCH: 27.8 pg (ref 26.0–34.0)
MCHC: 30 g/dL (ref 30.0–36.0)
MCV: 92.9 fL (ref 80.0–100.0)
Monocytes Absolute: 0.7 10*3/uL (ref 0.1–1.0)
Monocytes Relative: 7 %
Neutro Abs: 8 10*3/uL — ABNORMAL HIGH (ref 1.7–7.7)
Neutrophils Relative %: 83 %
Platelets: 354 10*3/uL (ref 150–400)
RBC: 3.09 MIL/uL — ABNORMAL LOW (ref 4.22–5.81)
RDW: 16.9 % — ABNORMAL HIGH (ref 11.5–15.5)
WBC: 9.9 10*3/uL (ref 4.0–10.5)
nRBC: 0 % (ref 0.0–0.2)

## 2019-05-13 LAB — COMPREHENSIVE METABOLIC PANEL
ALT: 14 U/L (ref 0–44)
AST: 14 U/L — ABNORMAL LOW (ref 15–41)
Albumin: 3.1 g/dL — ABNORMAL LOW (ref 3.5–5.0)
Alkaline Phosphatase: 86 U/L (ref 38–126)
Anion gap: 14 (ref 5–15)
BUN: 17 mg/dL (ref 6–20)
CO2: 31 mmol/L (ref 22–32)
Calcium: 9.2 mg/dL (ref 8.9–10.3)
Chloride: 92 mmol/L — ABNORMAL LOW (ref 98–111)
Creatinine, Ser: 7.69 mg/dL — ABNORMAL HIGH (ref 0.61–1.24)
GFR calc Af Amer: 8 mL/min — ABNORMAL LOW (ref >60)
GFR calc non Af Amer: 7 mL/min — ABNORMAL LOW (ref >60)
Glucose, Bld: 136 mg/dL — ABNORMAL HIGH (ref 70–99)
Potassium: 4.2 mmol/L (ref 3.5–5.1)
Sodium: 137 mmol/L (ref 135–145)
Total Bilirubin: 0.6 mg/dL (ref 0.3–1.2)
Total Protein: 7.7 g/dL (ref 6.5–8.1)

## 2019-05-13 LAB — LACTIC ACID, PLASMA: Lactic Acid, Venous: 0.6 mmol/L (ref 0.5–1.9)

## 2019-05-13 NOTE — Progress Notes (Addendum)
Subjective:  Patient ID: [name], male    DOB: 1966/08/19,  MRN: [id number]  No chief complaint on file.  53 y.o. male presents for wound care. Complains of new odor to the right 2nd toe wound. States he noticed worsening odor starting last night otherwise the toe has been doing fine. Has had [facility] coming to dress the wound.  Complains of new warmth to the right leg and complaint of bulging vein.   Review of Systems: Negative except as noted in the HPI. Denies N/V/F/Ch.  Past Medical History:  Diagnosis Date  . Chills    at night - sometimes  . Complication of anesthesia   . Diabetes mellitus    controlled by diet  . 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 Eczema   . Fatigue    loss of fatigue  . Headache(784.0)    Years ago  . Heart murmur    years ago  . History of blood transfusion   . Hypertension    sees Dr. Jaci Standard  . MRSA (methicillin resistant staph aureus) culture positive   . PONV (postoperative nausea and vomiting)   . Poor circulation   . Renal disorder     Current Outpatient Medications:  .  acetaminophen (TYLENOL) 500 MG tablet, Take 1,000 mg by mouth every 6 (six) hours as needed for moderate pain or headache., Disp: , Rfl:  .  blood glucose meter kit and supplies, Dispense based on patient and insurance preference. Use up to four times daily as directed. (FOR ICD-9 250.00, 250.01)., Disp: 1 each, Rfl: 0 .  clindamycin (CLEOCIN) 300 MG capsule, Take 1 capsule (300 mg total) by mouth 2 (two) times daily., Disp: 14 capsule, Rfl: 0 .  ferric citrate (AURYXIA) 1 GM 210 MG(Fe) tablet, Take 210-630 mg by mouth See admin instructions. Take 420 to 630 mg with each meal depending on the size of the meal, and take 210 mg with each snack, Disp: , Rfl:  .  glucose blood (ACCU-CHEK AVIVA PLUS) test strip, Use as instructed, Disp: 100 each, Rfl: 12 .  Lancet Devices (ACCU-CHEK SOFTCLIX) lancets, Use as instructed, Disp: 100 each, Rfl: 5 .  lidocaine  (XYLOCAINE) 2 % jelly, Apply 1 application topically as needed. (Patient not taking: Reported on 05/12/2019), Disp: 30 mL, Rfl: 1 .  loratadine (CLARITIN) 10 MG tablet, Take 10 mg by mouth daily as needed for allergies., Disp: , Rfl:  .  Melatonin 5 MG CAPS, Take 10 mg by mouth at bedtime as needed (sleep)., Disp: , Rfl:  .  multivitamin (RENA-VIT) TABS tablet, Take 1 tablet by mouth every Monday, Wednesday, and Friday. After dialysis, Disp: , Rfl:   Social History   Tobacco Use  Smoking Status Never Smoker  Smokeless Tobacco Never Used    Allergies  Allergen Reactions  . Amoxicillin Itching    Did it involve swelling of the face/tongue/throat, SOB, or low BP? Unknown Did it involve sudden or severe rash/hives, skin peeling, or any reaction on the inside of your mouth or nose? Unknown Did you need to seek medical attention at a hospital or doctor's office? Unknown When did it last happen?20 years ago If all above answers are "NO", may proceed with cephalosporin use.   Marland Kitchen Hydromorphone Itching    Patient states he may take with benadryl. Makes feet itch.  Marland Kitchen Percocet [Oxycodone-Acetaminophen] Itching and Other (See Comments)    Legs only. Itching and burning feeling.   Objective:   Vitals:   05/13/19  1749  BP: (!) 194/103  Pulse: 100  Temp: (!) 101.2 F (38.4 C)   There is no height or weight on file to calculate BMI. Constitutional Well developed. Well nourished.  Vascular Dorsalis pedis pulses palpable bilaterally. Posterior tibial pulses palpable bilaterally. Capillary refill normal to all digits.  No cyanosis or clubbing noted. Pedal hair growth normal.  Neurologic Normal speech. Oriented to person, place, and time. Protective sensation absent  Dermatologic Right 2nd toe wound with significant distal necrosis, exposed distal phalanx. Desquamation. No purulence. No erythema. No excessive warmth.  Right hallux without evident open wounds.  Orthopedic: No pain to  palpation either foot.   Radiographs: none today. Assessment:   1. Osteomyelitis of toe of right foot ([place])   2. Ulcer of great toe, right, with necrosis of bone ([facility])   3. Gangrene of toe of right foot ([place])   4. Chronic osteomyelitis of toe of right foot ([place])   5. Acute deep vein thrombosis (DVT) of right lower extremity, unspecified vein ([facility])    Plan:  Patient was evaluated and treated and all questions answered.  Ulcer Right 2nd Toe -Worsening distal noted today. -Debrided and flushed with Dakin's solution. Dressed with Betadine WTD. -Given that patient is febrile and tachycardic will advise to present to ED for admission. Will plan for surgery in the AM. -Plan for partial vs total amputation right 2nd toe.  Procedure: Excisional Debridement of Wound Rationale: Removal of non-viable soft tissue from the wound to promote healing.  Anesthesia: none Pre-Debridement Wound Measurements: 1 cm x 1 cm x 0.5 cm  Post-Debridement Wound Measurements: 1.2 cm x 1 cm x 0.5 cm  Type of Debridement: Sharp Excisional Tissue Removed: Non-viable soft tissue Depth of Debridement: bone Technique: Sharp excisional debridement to bleeding, viable wound base.  Dressing: Dry, sterile, compression dressing. Disposition: Patient tolerated procedure well. Patient to return in 1 week for follow-up.  Chronic OM Right Hallux -Stable. Planning for bone biopsy at time of surgery.  Right leg edema -Will have patient worked up in the ED for possible DVT RLE  30 minutes of face to face time were spent with the patient. >50% of this was spent on counseling and coordination of care. Specifically discussed with patient the above diagnoses and overall treatment plan. This was exclusive of procedural time for debridement of the ulceration.  No follow-ups on file.

## 2019-05-13 NOTE — Addendum Note (Signed)
Addended by: Hardie Pulley on: 05/13/2019 06:06 PM   Modules accepted: Level of Service

## 2019-05-13 NOTE — ED Triage Notes (Signed)
C/o right foot pain + swelling; reported currently on ABX; reported hx of DM but not taking meds for it; HD q MWF

## 2019-05-14 ENCOUNTER — Ambulatory Visit (HOSPITAL_COMMUNITY): Admission: RE | Admit: 2019-05-14 | Payer: Medicare Other | Source: Home / Self Care | Admitting: Podiatry

## 2019-05-14 ENCOUNTER — Observation Stay (HOSPITAL_COMMUNITY): Payer: Medicare Other

## 2019-05-14 ENCOUNTER — Encounter (HOSPITAL_COMMUNITY)
Admission: EM | Disposition: A | Discharge status: Home Health | Payer: Self-pay | Source: Home / Self Care | Attending: Internal Medicine

## 2019-05-14 ENCOUNTER — Encounter (HOSPITAL_COMMUNITY): Payer: Self-pay | Admitting: Certified Registered"

## 2019-05-14 ENCOUNTER — Observation Stay (HOSPITAL_COMMUNITY): Payer: Medicare Other | Admitting: Certified Registered"

## 2019-05-14 ENCOUNTER — Observation Stay (HOSPITAL_BASED_OUTPATIENT_CLINIC_OR_DEPARTMENT_OTHER): Payer: Medicare Other

## 2019-05-14 ENCOUNTER — Other Ambulatory Visit: Payer: Self-pay

## 2019-05-14 DIAGNOSIS — L97514 Non-pressure chronic ulcer of other part of right foot with necrosis of bone: Secondary | ICD-10-CM | POA: Diagnosis not present

## 2019-05-14 DIAGNOSIS — M869 Osteomyelitis, unspecified: Secondary | ICD-10-CM | POA: Diagnosis not present

## 2019-05-14 DIAGNOSIS — N186 End stage renal disease: Secondary | ICD-10-CM | POA: Diagnosis not present

## 2019-05-14 DIAGNOSIS — E1129 Type 2 diabetes mellitus with other diabetic kidney complication: Secondary | ICD-10-CM

## 2019-05-14 DIAGNOSIS — M868X7 Other osteomyelitis, ankle and foot: Secondary | ICD-10-CM | POA: Diagnosis not present

## 2019-05-14 DIAGNOSIS — M79609 Pain in unspecified limb: Secondary | ICD-10-CM

## 2019-05-14 DIAGNOSIS — M7989 Other specified soft tissue disorders: Secondary | ICD-10-CM | POA: Diagnosis not present

## 2019-05-14 DIAGNOSIS — N2581 Secondary hyperparathyroidism of renal origin: Secondary | ICD-10-CM | POA: Diagnosis not present

## 2019-05-14 DIAGNOSIS — E08621 Diabetes mellitus due to underlying condition with foot ulcer: Secondary | ICD-10-CM

## 2019-05-14 DIAGNOSIS — I120 Hypertensive chronic kidney disease with stage 5 chronic kidney disease or end stage renal disease: Secondary | ICD-10-CM | POA: Diagnosis not present

## 2019-05-14 DIAGNOSIS — M86671 Other chronic osteomyelitis, right ankle and foot: Secondary | ICD-10-CM | POA: Diagnosis not present

## 2019-05-14 DIAGNOSIS — E11621 Type 2 diabetes mellitus with foot ulcer: Secondary | ICD-10-CM

## 2019-05-14 DIAGNOSIS — Z6831 Body mass index (BMI) 31.0-31.9, adult: Secondary | ICD-10-CM | POA: Diagnosis not present

## 2019-05-14 DIAGNOSIS — E1169 Type 2 diabetes mellitus with other specified complication: Secondary | ICD-10-CM | POA: Diagnosis not present

## 2019-05-14 DIAGNOSIS — D62 Acute posthemorrhagic anemia: Secondary | ICD-10-CM | POA: Diagnosis not present

## 2019-05-14 DIAGNOSIS — Z20828 Contact with and (suspected) exposure to other viral communicable diseases: Secondary | ICD-10-CM | POA: Diagnosis not present

## 2019-05-14 DIAGNOSIS — D631 Anemia in chronic kidney disease: Secondary | ICD-10-CM | POA: Diagnosis not present

## 2019-05-14 DIAGNOSIS — L089 Local infection of the skin and subcutaneous tissue, unspecified: Secondary | ICD-10-CM | POA: Diagnosis not present

## 2019-05-14 DIAGNOSIS — M86171 Other acute osteomyelitis, right ankle and foot: Secondary | ICD-10-CM | POA: Diagnosis not present

## 2019-05-14 DIAGNOSIS — I10 Essential (primary) hypertension: Secondary | ICD-10-CM

## 2019-05-14 DIAGNOSIS — K30 Functional dyspepsia: Secondary | ICD-10-CM | POA: Diagnosis not present

## 2019-05-14 DIAGNOSIS — A419 Sepsis, unspecified organism: Secondary | ICD-10-CM

## 2019-05-14 DIAGNOSIS — E1152 Type 2 diabetes mellitus with diabetic peripheral angiopathy with gangrene: Secondary | ICD-10-CM | POA: Diagnosis not present

## 2019-05-14 DIAGNOSIS — S98921A Partial traumatic amputation of right foot, level unspecified, initial encounter: Secondary | ICD-10-CM | POA: Diagnosis not present

## 2019-05-14 HISTORY — PX: BONE BIOPSY: SHX375

## 2019-05-14 HISTORY — DX: Sepsis, unspecified organism: A41.9

## 2019-05-14 HISTORY — DX: Type 2 diabetes mellitus with foot ulcer: E11.621

## 2019-05-14 HISTORY — DX: Osteomyelitis, unspecified: M86.9

## 2019-05-14 HISTORY — PX: AMPUTATION TOE: SHX6595

## 2019-05-14 LAB — POCT I-STAT 4, (NA,K, GLUC, HGB,HCT)
Glucose, Bld: 117 mg/dL — ABNORMAL HIGH (ref 70–99)
HCT: 29 % — ABNORMAL LOW (ref 39.0–52.0)
Hemoglobin: 9.9 g/dL — ABNORMAL LOW (ref 13.0–17.0)
Potassium: 4.3 mmol/L (ref 3.5–5.1)
Sodium: 134 mmol/L — ABNORMAL LOW (ref 135–145)

## 2019-05-14 LAB — GLUCOSE, CAPILLARY
Glucose-Capillary: 129 mg/dL — ABNORMAL HIGH (ref 70–99)
Glucose-Capillary: 112 mg/dL — ABNORMAL HIGH (ref 70–99)
Glucose-Capillary: 126 mg/dL — ABNORMAL HIGH (ref 70–99)
Glucose-Capillary: 118 mg/dL — ABNORMAL HIGH (ref 70–99)
Glucose-Capillary: 200 mg/dL — ABNORMAL HIGH (ref 70–99)
Glucose-Capillary: 153 mg/dL — ABNORMAL HIGH (ref 70–99)

## 2019-05-14 LAB — CBC
HCT: 32.1 % — ABNORMAL LOW (ref 39.0–52.0)
Hemoglobin: 9.7 g/dL — ABNORMAL LOW (ref 13.0–17.0)
MCH: 27.6 pg (ref 26.0–34.0)
MCHC: 30.2 g/dL (ref 30.0–36.0)
MCV: 91.2 fL (ref 80.0–100.0)
Platelets: 367 10*3/uL (ref 150–400)
RBC: 3.52 MIL/uL — ABNORMAL LOW (ref 4.22–5.81)
RDW: 16.8 % — ABNORMAL HIGH (ref 11.5–15.5)
WBC: 9.4 10*3/uL (ref 4.0–10.5)
nRBC: 0 % (ref 0.0–0.2)

## 2019-05-14 LAB — SARS CORONAVIRUS 2 BY RT PCR (HOSPITAL ORDER, PERFORMED IN ~~LOC~~ HOSPITAL LAB): SARS Coronavirus 2: NEGATIVE

## 2019-05-14 LAB — C-REACTIVE PROTEIN: CRP: 22.6 mg/dL — ABNORMAL HIGH (ref <1.0)

## 2019-05-14 LAB — SEDIMENTATION RATE: Sed Rate: 127 mm/hr — ABNORMAL HIGH (ref 0–16)

## 2019-05-14 LAB — MRSA PCR SCREENING: MRSA by PCR: NEGATIVE

## 2019-05-14 LAB — SARS CORONAVIRUS 2 (TAT 6-24 HRS): SARS Coronavirus 2: NEGATIVE

## 2019-05-14 SURGERY — AMPUTATION, TOE
Anesthesia: General | Site: Toe | Laterality: Right

## 2019-05-14 MED ORDER — FENTANYL CITRATE (PF) 100 MCG/2ML IJ SOLN: 25.0000 ug | INTRAMUSCULAR | Status: DC | PRN

## 2019-05-14 MED ORDER — MUSCLE RUB 10-15 % EX CREA
1.0000 "application " | TOPICAL_CREAM | CUTANEOUS | Status: DC | PRN
  Filled 2019-05-14: qty 85

## 2019-05-14 MED ORDER — 0.9 % SODIUM CHLORIDE (POUR BTL) OPTIME
TOPICAL | Status: DC | PRN
  Administered 2019-05-14: 1000 mL

## 2019-05-14 MED ORDER — PHENOL 1.4 % MT LIQD: 1.0000 | OROMUCOSAL | Status: DC | PRN

## 2019-05-14 MED ORDER — LIDOCAINE HCL 2 % IJ SOLN
INTRAMUSCULAR | Status: AC
  Filled 2019-05-14: qty 20

## 2019-05-14 MED ORDER — INSULIN ASPART 100 UNIT/ML ~~LOC~~ SOLN
0.0000 [IU] | Freq: Three times a day (TID) | SUBCUTANEOUS | Status: DC
  Administered 2019-05-14 – 2019-05-15 (×2): 2 [IU] via SUBCUTANEOUS

## 2019-05-14 MED ORDER — FERRIC CITRATE 1 GM 210 MG(FE) PO TABS
420.0000 mg | ORAL_TABLET | Freq: Three times a day (TID) | ORAL | Status: DC
  Filled 2019-05-14 (×5): qty 3

## 2019-05-14 MED ORDER — POLYETHYLENE GLYCOL 3350 17 G PO PACK: 17.0000 g | PACK | Freq: Every day | ORAL | Status: DC | PRN

## 2019-05-14 MED ORDER — ACETAMINOPHEN 500 MG PO TABS: 1000.0000 mg | ORAL_TABLET | Freq: Once | ORAL | Status: DC | PRN

## 2019-05-14 MED ORDER — DIPHENHYDRAMINE HCL 25 MG PO CAPS
25.0000 mg | ORAL_CAPSULE | Freq: Four times a day (QID) | ORAL | Status: DC | PRN
  Administered 2019-05-14 – 2019-05-18 (×8): 25 mg via ORAL
  Filled 2019-05-14 (×9): qty 1

## 2019-05-14 MED ORDER — SODIUM CHLORIDE 0.9 % IV SOLN
1.0000 g | INTRAVENOUS | Status: DC
  Administered 2019-05-14 – 2019-05-17 (×4): 1 g via INTRAVENOUS
  Filled 2019-05-14 (×2): qty 10
  Filled 2019-05-14: qty 1
  Filled 2019-05-14 (×3): qty 10

## 2019-05-14 MED ORDER — RENA-VITE PO TABS
1.0000 | ORAL_TABLET | ORAL | Status: DC
  Administered 2019-05-16: 1 via ORAL
  Filled 2019-05-14: qty 1

## 2019-05-14 MED ORDER — BUPIVACAINE HCL (PF) 0.5 % IJ SOLN
INTRAMUSCULAR | Status: DC | PRN
  Administered 2019-05-14: 5 mL

## 2019-05-14 MED ORDER — MIDAZOLAM HCL 5 MG/5ML IJ SOLN
INTRAMUSCULAR | Status: DC | PRN
  Administered 2019-05-14: 2 mg via INTRAVENOUS

## 2019-05-14 MED ORDER — EPHEDRINE SULFATE-NACL 50-0.9 MG/10ML-% IV SOSY
PREFILLED_SYRINGE | INTRAVENOUS | Status: DC | PRN
  Administered 2019-05-14 (×2): 5 mg via INTRAVENOUS

## 2019-05-14 MED ORDER — MORPHINE SULFATE (PF) 4 MG/ML IV SOLN
4.0000 mg | Freq: Once | INTRAVENOUS | Status: AC
  Administered 2019-05-14: 4 mg via INTRAVENOUS
  Filled 2019-05-14: qty 1

## 2019-05-14 MED ORDER — MORPHINE SULFATE (PF) 2 MG/ML IV SOLN
2.0000 mg | INTRAVENOUS | Status: DC | PRN
  Administered 2019-05-14 – 2019-05-16 (×6): 2 mg via INTRAVENOUS
  Filled 2019-05-14 (×7): qty 1

## 2019-05-14 MED ORDER — LIP MEDEX EX OINT
1.0000 "application " | TOPICAL_OINTMENT | CUTANEOUS | Status: DC | PRN
  Filled 2019-05-14: qty 7

## 2019-05-14 MED ORDER — BUPIVACAINE HCL (PF) 0.5 % IJ SOLN
INTRAMUSCULAR | Status: AC
  Filled 2019-05-14: qty 30

## 2019-05-14 MED ORDER — FERRIC CITRATE 1 GM 210 MG(FE) PO TABS
210.0000 mg | ORAL_TABLET | ORAL | Status: DC | PRN
  Filled 2019-05-14: qty 1

## 2019-05-14 MED ORDER — SODIUM CHLORIDE 0.9 % IR SOLN
Status: DC | PRN
  Administered 2019-05-14 (×2): 3000 mL

## 2019-05-14 MED ORDER — ONDANSETRON HCL 4 MG/2ML IJ SOLN: 4.0000 mg | Freq: Four times a day (QID) | INTRAMUSCULAR | Status: DC | PRN

## 2019-05-14 MED ORDER — ACETAMINOPHEN 160 MG/5ML PO SOLN: 1000.0000 mg | Freq: Once | ORAL | Status: DC | PRN

## 2019-05-14 MED ORDER — MELATONIN 3 MG PO TABS
9.0000 mg | ORAL_TABLET | Freq: Every evening | ORAL | Status: DC | PRN
  Filled 2019-05-14: qty 3

## 2019-05-14 MED ORDER — HYDROCORTISONE 1 % EX CREA
1.0000 "application " | TOPICAL_CREAM | Freq: Three times a day (TID) | CUTANEOUS | Status: DC | PRN
  Filled 2019-05-14: qty 28

## 2019-05-14 MED ORDER — SODIUM CHLORIDE 0.9 % IV SOLN
1.0000 g | Freq: Once | INTRAVENOUS | Status: AC
  Administered 2019-05-14: 1 g via INTRAVENOUS
  Filled 2019-05-14: qty 10

## 2019-05-14 MED ORDER — SALINE SPRAY 0.65 % NA SOLN
1.0000 | NASAL | Status: DC | PRN
  Filled 2019-05-14: qty 44

## 2019-05-14 MED ORDER — DEXAMETHASONE SODIUM PHOSPHATE 4 MG/ML IJ SOLN
INTRAMUSCULAR | Status: DC | PRN
  Administered 2019-05-14: 4 mg via INTRAVENOUS

## 2019-05-14 MED ORDER — INSULIN ASPART 100 UNIT/ML ~~LOC~~ SOLN
0.0000 [IU] | Freq: Every day | SUBCUTANEOUS | Status: DC
  Administered 2019-05-17: 2 [IU] via SUBCUTANEOUS

## 2019-05-14 MED ORDER — POLYVINYL ALCOHOL 1.4 % OP SOLN
1.0000 [drp] | OPHTHALMIC | Status: DC | PRN
  Filled 2019-05-14: qty 15

## 2019-05-14 MED ORDER — ONDANSETRON HCL 4 MG PO TABS: 4.0000 mg | ORAL_TABLET | Freq: Four times a day (QID) | ORAL | Status: DC | PRN

## 2019-05-14 MED ORDER — VANCOMYCIN HCL 10 G IV SOLR
2000.0000 mg | Freq: Once | INTRAVENOUS | Status: AC
  Administered 2019-05-14: 2000 mg via INTRAVENOUS
  Filled 2019-05-14: qty 2000

## 2019-05-14 MED ORDER — ACETAMINOPHEN 10 MG/ML IV SOLN: 1000.0000 mg | Freq: Once | INTRAVENOUS | Status: DC | PRN

## 2019-05-14 MED ORDER — SENNOSIDES-DOCUSATE SODIUM 8.6-50 MG PO TABS
1.0000 | ORAL_TABLET | Freq: Every evening | ORAL | Status: DC | PRN
  Filled 2019-05-14: qty 1

## 2019-05-14 MED ORDER — HYDROCORTISONE (PERIANAL) 2.5 % EX CREA
1.0000 "application " | TOPICAL_CREAM | Freq: Four times a day (QID) | CUTANEOUS | Status: DC | PRN
  Filled 2019-05-14: qty 28.35

## 2019-05-14 MED ORDER — LIDOCAINE 2% (20 MG/ML) 5 ML SYRINGE
INTRAMUSCULAR | Status: DC | PRN
  Administered 2019-05-14: 80 mg via INTRAVENOUS

## 2019-05-14 MED ORDER — FENTANYL CITRATE (PF) 100 MCG/2ML IJ SOLN
INTRAMUSCULAR | Status: DC | PRN
  Administered 2019-05-14: 50 ug via INTRAVENOUS

## 2019-05-14 MED ORDER — VANCOMYCIN HCL 1000 MG IV SOLR
INTRAVENOUS | Status: DC | PRN
  Administered 2019-05-14: 1000 mg via TOPICAL

## 2019-05-14 MED ORDER — HYDROCODONE-ACETAMINOPHEN 7.5-325 MG PO TABS: 1.0000 | ORAL_TABLET | Freq: Once | ORAL | Status: DC | PRN

## 2019-05-14 MED ORDER — SODIUM CHLORIDE 0.9 % IV SOLN
INTRAVENOUS | Status: DC
  Administered 2019-05-14: 10:00:00 via INTRAVENOUS

## 2019-05-14 MED ORDER — PHENYLEPHRINE 40 MCG/ML (10ML) SYRINGE FOR IV PUSH (FOR BLOOD PRESSURE SUPPORT)
PREFILLED_SYRINGE | INTRAVENOUS | Status: DC | PRN
  Administered 2019-05-14 (×7): 80 ug via INTRAVENOUS

## 2019-05-14 MED ORDER — ALUM & MAG HYDROXIDE-SIMETH 200-200-20 MG/5ML PO SUSP
30.0000 mL | ORAL | Status: DC | PRN
  Administered 2019-05-14 – 2019-05-17 (×3): 30 mL via ORAL
  Filled 2019-05-14 (×3): qty 30

## 2019-05-14 MED ORDER — ONDANSETRON HCL 4 MG/2ML IJ SOLN
INTRAMUSCULAR | Status: DC | PRN
  Administered 2019-05-14: 4 mg via INTRAVENOUS

## 2019-05-14 MED ORDER — LIDOCAINE HCL 2 % IJ SOLN
INTRAMUSCULAR | Status: DC | PRN
  Administered 2019-05-14: 5 mL

## 2019-05-14 MED ORDER — ONDANSETRON HCL 4 MG/2ML IJ SOLN
4.0000 mg | Freq: Once | INTRAMUSCULAR | Status: AC
  Administered 2019-05-14: 4 mg via INTRAVENOUS
  Filled 2019-05-14: qty 2

## 2019-05-14 MED ORDER — GLYCOPYRROLATE PF 0.2 MG/ML IJ SOSY
PREFILLED_SYRINGE | INTRAMUSCULAR | Status: DC | PRN
  Administered 2019-05-14: .1 mg via INTRAVENOUS

## 2019-05-14 MED ORDER — HYDRALAZINE HCL 20 MG/ML IJ SOLN: 5.0000 mg | INTRAMUSCULAR | Status: DC | PRN

## 2019-05-14 MED ORDER — PROPOFOL 10 MG/ML IV BOLUS
INTRAVENOUS | Status: DC | PRN
  Administered 2019-05-14: 140 mg via INTRAVENOUS

## 2019-05-14 MED ORDER — ACETAMINOPHEN 325 MG PO TABS
650.0000 mg | ORAL_TABLET | Freq: Four times a day (QID) | ORAL | Status: DC | PRN
  Administered 2019-05-16 (×2): 650 mg via ORAL
  Filled 2019-05-14 (×2): qty 2

## 2019-05-14 MED ORDER — LORATADINE 10 MG PO TABS: 10.0000 mg | ORAL_TABLET | Freq: Every day | ORAL | Status: DC | PRN

## 2019-05-14 MED ORDER — ACETAMINOPHEN 650 MG RE SUPP: 650.0000 mg | Freq: Four times a day (QID) | RECTAL | Status: DC | PRN

## 2019-05-14 SURGICAL SUPPLY — 29 items
BNDG ELASTIC 4X5.8 VLCR STR LF (GAUZE/BANDAGES/DRESSINGS) ×3 IMPLANT
BNDG ELASTIC 6X15 VLCR STRL LF (GAUZE/BANDAGES/DRESSINGS) ×3 IMPLANT
BNDG GAUZE ELAST 4 BULKY (GAUZE/BANDAGES/DRESSINGS) ×3 IMPLANT
CONT SPEC 4OZ CLIKSEAL STRL BL (MISCELLANEOUS) ×12 IMPLANT
COVER SURGICAL LIGHT HANDLE (MISCELLANEOUS) ×3 IMPLANT
COVER WAND RF STERILE (DRAPES) ×3 IMPLANT
ELECT REM PT RETURN 9FT ADLT (ELECTROSURGICAL) ×3
ELECTRODE REM PT RTRN 9FT ADLT (ELECTROSURGICAL) ×2 IMPLANT
GAUZE SPONGE 4X4 12PLY STRL (GAUZE/BANDAGES/DRESSINGS) ×3 IMPLANT
GAUZE XEROFORM 1X8 LF (GAUZE/BANDAGES/DRESSINGS) ×6 IMPLANT
GLOVE BIO SURGEON STRL SZ8 (GLOVE) ×3 IMPLANT
GLOVE BIOGEL PI IND STRL 8 (GLOVE) ×2 IMPLANT
GLOVE BIOGEL PI INDICATOR 8 (GLOVE) ×1
GOWN STRL REUS W/ TWL LRG LVL3 (GOWN DISPOSABLE) ×4 IMPLANT
GOWN STRL REUS W/TWL LRG LVL3 (GOWN DISPOSABLE) ×2
KIT BASIN OR (CUSTOM PROCEDURE TRAY) ×3 IMPLANT
KIT TURNOVER KIT B (KITS) ×3 IMPLANT
NEEDLE PRECISIONGLIDE 27X1.5 (NEEDLE) ×3 IMPLANT
NS IRRIG 1000ML POUR BTL (IV SOLUTION) ×3 IMPLANT
PACK ORTHO EXTREMITY (CUSTOM PROCEDURE TRAY) ×3 IMPLANT
SET CYSTO W/LG BORE CLAMP LF (SET/KITS/TRAYS/PACK) ×3 IMPLANT
SOL PREP POV-IOD 4OZ 10% (MISCELLANEOUS) ×6 IMPLANT
STAPLER VISISTAT 35W (STAPLE) ×3 IMPLANT
SUT ETHILON 4 0 PS 2 18 (SUTURE) ×3 IMPLANT
SYR CONTROL 10ML LL (SYRINGE) ×6 IMPLANT
TOWEL GREEN STERILE (TOWEL DISPOSABLE) ×3 IMPLANT
TOWEL GREEN STERILE FF (TOWEL DISPOSABLE) ×3 IMPLANT
TUBE CONNECTING 12X1/4 (SUCTIONS) ×3 IMPLANT
YANKAUER SUCT BULB TIP NO VENT (SUCTIONS) ×3 IMPLANT

## 2019-05-14 NOTE — Anesthesia Preprocedure Evaluation (Signed)
Anesthesia Evaluation  Patient identified by MRN, date of birth, ID band Patient awake    Reviewed: Allergy & Precautions, NPO status , Patient's Chart, lab work & pertinent test results  History of Anesthesia Complications (+) PONV and history of anesthetic complications  Airway Mallampati: III  TM Distance: >3 FB Neck ROM: Full    Dental  (+) Teeth Intact, Dental Advisory Given   Pulmonary neg shortness of breath, neg COPD, neg recent URI,    breath sounds clear to auscultation       Cardiovascular hypertension, (-) angina(-) Past MI and (-) CHF  Rhythm:Regular     Neuro/Psych  Headaches, negative psych ROS   GI/Hepatic negative GI ROS, Neg liver ROS,   Endo/Other  diabetes, Type 2  Renal/GU ESRF and DialysisRenal disease     Musculoskeletal   Abdominal   Peds  Hematology   Anesthesia Other Findings   Reproductive/Obstetrics                             Anesthesia Physical Anesthesia Plan  ASA: IV  Anesthesia Plan: General   Post-op Pain Management:    Induction: Intravenous  PONV Risk Score and Plan: 3 and Ondansetron and Dexamethasone  Airway Management Planned: Oral ETT and LMA  Additional Equipment: None  Intra-op Plan:   Post-operative Plan:   Informed Consent: I have reviewed the patients History and Physical, chart, labs and discussed the procedure including the risks, benefits and alternatives for the proposed anesthesia with the patient or authorized representative who has indicated his/her understanding and acceptance.     Dental advisory given  Plan Discussed with: CRNA and Surgeon  Anesthesia Plan Comments:         Anesthesia Quick Evaluation

## 2019-05-14 NOTE — Progress Notes (Signed)
  Subjective:  Patient ID: [name], male    DOB: 1966-01-11,  MRN: [id number]  No overnight events just had DVT ultrasound performed.  Denies pain or concerns this a.m.  Objective:   Vitals:   05/14/19 0400 05/14/19 0506  BP: 130/68 (!) 141/78  Pulse: 94 95  Resp:    Temp:  98.8 F (37.1 C)  SpO2: 98% 100%   General AA&O x3. Normal mood and affect.  Vascular Dorsalis pedis and posterior tibial pulses 2/4 bilat. Brisk capillary refill to all digits. Pedal hair present.  Neurologic Epicritic sensation grossly diminished.  Dermatologic  ulceration of distal tip of second toe right foot with exposed bone  Orthopedic: Motor intact distally   Assessment & Plan:  Patient was evaluated and treated and all questions answered.  R Leg Wet Gangrene Right 2nd Toe, Osteomyelitis, Osteomyelitis Right Great Toe -To OR today for right second toe partial versus total amputation, right great toe bone biopsy -DVT ultrasound reviewed noted no evidence of DVT  -Continue empiric antibiotics. -We will take cultures intraoperatively  -Will continue to follow.  Evelina Bucy, DPM  Accessible via secure chat for questions or concerns.

## 2019-05-14 NOTE — H&P (Signed)
History and Physical    [name] ZOX:[id number] DOB: [birth year] DOA: 05/13/2019  Referring MD/NP/PA:   PCP: Hayden Rasmussen, MD   Patient coming from:  The patient is coming from home.  At baseline, pt is independent for most of ADL.        Chief Complaint: right foot pain  HPI: [name] is a 53 y.o. male with medical history significant of hypertension, hyperlipidemia, diabetes mellitus, ESRD-HD (MWF), obesity, iron deficiency anemia, who presents with right foot pain.  Patient states that he injured second toe of left foot on July 4, since then he developed a blister which has been progressively worsening, and formed an ulcer, and got infected. He was treated with clindamycin from 7/17-7/14 by podiatrist, without significant improvement.  He has worsened pain with draining. The pain is constant, 8 out of 10 severity, sharp, nonradiating.  He was seen by podiatrist, Dr. March Rummage, who recommended patient be admitted and Dr. March Rummage will do surgery in the morning.  Patient does not have chills, but has fever of 101.2 today.  Denies chest pain, shortness breath, cough.  No nausea vomiting, diarrhea, abdominal pain, symptoms of UTI and unilateral weakness.  Patient also reports right ankle and calf pain recently.  ED Course: pt was found to have WBC 9.9, lactic acid of 0.6, pending COVID-19 test, potassium 4.2, bicarbonate 31, creatinine 7.69, BUN 17, temperature well 1.2, blood pressure 138/72, tachycardia, oxygen saturation 97 to 99% on room air.  Patient is placed on MedSurg bed for observation.  Review of Systems:   General: has fevers, no chills, no body weight gain, fatigue HEENT: no blurry vision, hearing changes or sore throat Respiratory: no dyspnea, coughing, wheezing CV: no chest pain, no palpitations GI: no nausea, vomiting, abdominal pain, diarrhea, constipation GU: no dysuria, burning on urination, increased urinary frequency, hematuria  Ext: no leg edema Neuro: no  unilateral weakness, numbness, or tingling, no vision change or hearing loss Skin: no rash, no skin tear. Has right second toe ulcer MSK: No muscle spasm, no deformity, no limitation of range of movement in spin Heme: No easy bruising.  Travel history: No recent long distant travel.  Allergy:  Allergies  Allergen Reactions  . Amoxicillin Itching    Did it involve swelling of the face/tongue/throat, SOB, or low BP? Unknown Did it involve sudden or severe rash/hives, skin peeling, or any reaction on the inside of your mouth or nose? Unknown Did you need to seek medical attention at a hospital or doctor's office? Unknown When did it last happen?20 years ago If all above answers are "NO", may proceed with cephalosporin use.   Marland Kitchen Hydromorphone Itching    Patient states he may take with benadryl. Makes feet itch.  Marland Kitchen Percocet [Oxycodone-Acetaminophen] Itching and Other (See Comments)    Legs only. Itching and burning feeling.    Past Medical History:  Diagnosis Date  . Chills    at night - sometimes  . Complication of anesthesia   . Diabetes mellitus    controlled by diet  . 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 Eczema   . Fatigue    loss of fatigue  . Headache(784.0)    Years ago  . Heart murmur    years ago  . History of blood transfusion   . Hypertension    sees Dr. Jaci Standard  . MRSA (methicillin resistant staph aureus) culture positive   . PONV (postoperative nausea and vomiting)   . Poor circulation   .  Renal disorder     Past Surgical History:  Procedure Laterality Date  . AV FISTULA PLACEMENT  06-18-11   Right brachiocephalic AVF  . EYE SURGERY     left eye for Laser, diabetic retinopathy  . FISTULOGRAM Right 11/06/2011   Procedure: FISTULOGRAM;  Surgeon: Conrad Benton, MD;  Location: [facility] CATH LAB;  Service: Cardiovascular;  Laterality: Right;  . HEMATOMA EVACUATION Right Feb. 25, 2014  . HEMATOMA EVACUATION Right 12/14/2012   Procedure:  EVACUATION HEMATOMA;  Surgeon: Conrad Sand Hill, MD;  Location: [place];  Service: Vascular;  Laterality: Right;  . INSERTION OF DIALYSIS CATHETER  12/14/2012   Procedure: INSERTION OF DIALYSIS CATHETER;  Surgeon: Conrad [place]ville, MD;  Location: [place];  Service: Vascular;;  . REVISON OF ARTERIOVENOUS FISTULA Right 12/14/2012   Procedure: REVISON OF upper arm ARTERIOVENOUS FISTULA using 56mx10cm gortex graft;  Surgeon: BConrad Richfield MD;  [place]  Service: Vascular;  Laterality: Right;  . SOFT TISSUE MASS EXCISION     Right arm, Left leg  for MRSA infection    Social History:  reports that he has never smoked. He has never used smokeless tobacco. He reports that he does not drink alcohol or use drugs.  Family History:  Family History  Problem Relation Age of Onset  . Diabetes Mother      Prior to Admission medications   Medication Sig Start Date End Date Taking? Authorizing Provider  acetaminophen (TYLENOL) 500 MG tablet Take 1,000 mg by mouth every 6 (six) hours as needed for moderate pain or headache.    [provider]  blood glucose meter kit and supplies Dispense based on patient and insurance preference. Use up to four times daily as directed. (FOR ICD-9 250.00, 250.01). 05/06/15   Hongalgi, ALenis Dickinson MD  clindamycin (CLEOCIN) 300 MG capsule Take 1 capsule (300 mg total) by mouth 2 (two) times daily. 05/06/19   PEvelina Bucy DPM  ferric citrate (AURYXIA) 1 GM 210 MG(Fe) tablet Take 210-630 mg by mouth See admin instructions. Take 420 to 630 mg with each meal depending on the size of the meal, and take 210 mg with each snack    [provider]  glucose blood (ACCU-CHEK AVIVA PLUS) test strip Use as instructed 05/10/15   NCharlott Rakes MD  Lancet Devices ([facility]K [facility] lancets Use as instructed 05/10/15   NCharlott Rakes MD  lidocaine (XYLOCAINE) 2 % jelly Apply 1 application topically as needed. Patient not taking: Reported on 05/12/2019 12/04/18   HVanessa Kick MD   loratadine (CLARITIN) 10 MG tablet Take 10 mg by mouth daily as needed for allergies.    [provider]  Melatonin 5 MG CAPS Take 10 mg by mouth at bedtime as needed (sleep).    [provider]  multivitamin (RENA-VIT) TABS tablet Take 1 tablet by mouth every Monday, Wednesday, and Friday. After dialysis    [provider]    Physical Exam: Vitals:   05/14/19 0300 05/14/19 0350 05/14/19 0400 05/14/19 0506  BP:  119/72 130/68 (!) 141/78  Pulse: 90 91 94 95  Resp:      Temp:    98.8 F (37.1 C)  TempSrc:    Oral  SpO2: 93% 92% 98% 100%  Weight:      Height:       General: Not in acute distress HEENT:       Eyes: PERRL, EOMI, no scleral icterus.       ENT: No discharge  from the ears and nose, no pharynx injection, no tonsillar enlargement.        Neck: No JVD, no bruit, no mass felt. Heme: No neck lymph node enlargement. Cardiac: S1/S2, RRR, No murmurs, No gallops or rubs. Respiratory: No rales, wheezing, rhonchi or rubs. GI: Soft, nondistended, nontender, no rebound pain, no organomegaly, BS present. GU: No hematuria Ext: No pitting leg edema bilaterally. 2+DP/PT pulse bilaterally. Musculoskeletal: No joint deformities, No joint redness or warmth, no limitation of ROM in spin. Skin: has ulcer in right great toe with purulent drainage, appears to have necrosis at the tip of the right second toe.  Patient also has swelling, tenderness to right ankle and left calf. Neuro: Alert, oriented X3, cranial nerves II-XII grossly intact, moves all extremities normally.  Psych: Patient is not psychotic, no suicidal or hemocidal ideation.  Labs on Admission: I have personally reviewed following labs and imaging studies  CBC: Recent Labs  Lab 05/13/19 1928  WBC 9.9  NEUTROABS 8.0*  HGB 8.6*  HCT 28.7*  MCV 92.9  PLT 295   Basic Metabolic Panel: Recent Labs  Lab 05/13/19 1928  NA 137  K 4.2  CL 92*  CO2 31  GLUCOSE 136*  BUN 17  CREATININE 7.69*   CALCIUM 9.2   GFR: Estimated Creatinine Clearance: 13 mL/min (A) (by C-G formula based on SCr of 7.69 mg/dL (H)). Liver Function Tests: Recent Labs  Lab 05/13/19 1928  AST 14*  ALT 14  ALKPHOS 86  BILITOT 0.6  PROT 7.7  ALBUMIN 3.1*   No results for input(s): LIPASE, AMYLASE in the last 168 hours. No results for input(s): AMMONIA in the last 168 hours. Coagulation Profile: No results for input(s): INR, PROTIME in the last 168 hours. Cardiac Enzymes: No results for input(s): CKTOTAL, CKMB, CKMBINDEX, TROPONINI in the last 168 hours. BNP (last 3 results) No results for input(s): PROBNP in the last 8760 hours. HbA1C: No results for input(s): HGBA1C in the last 72 hours. CBG: Recent Labs  Lab 05/14/19 0533  GLUCAP 129*   Lipid Profile: No results for input(s): CHOL, HDL, LDLCALC, TRIG, CHOLHDL, LDLDIRECT in the last 72 hours. Thyroid Function Tests: No results for input(s): TSH, T4TOTAL, FREET4, T3FREE, THYROIDAB in the last 72 hours. Anemia Panel: No results for input(s): VITAMINB12, FOLATE, FERRITIN, TIBC, IRON, RETICCTPCT in the last 72 hours. Urine analysis:    Component Value Date/Time   COLORURINE YELLOW 06/06/2011 2300   APPEARANCEUR CLOUDY (A) 06/06/2011 2300   LABSPEC 1.021 06/06/2011 2300   PHURINE 5.0 06/06/2011 2300   GLUCOSEU NEGATIVE 06/06/2011 2300   GLUCOSEU > or = 1000 mg/dL (AA) 05/16/2008 1215   HGBUR SMALL (A) 06/06/2011 2300   HGBUR moderate 11/01/2007 0931   BILIRUBINUR NEGATIVE 06/06/2011 2300   KETONESUR NEGATIVE 06/06/2011 2300   PROTEINUR >300 (A) 06/06/2011 2300   UROBILINOGEN 0.2 06/06/2011 2300   NITRITE NEGATIVE 06/06/2011 2300   LEUKOCYTESUR SMALL (A) 06/06/2011 2300   Sepsis Labs: _0 (procalcitonin:4,lacticidven:4) ) Recent Results (from the past 240 hour(s))  WOUND CULTURE     Status: Abnormal   Collection Time: 05/06/19  5:16 PM  Result Value Ref Range Status   MICRO NUMBER: [id number]  Final   SPECIMEN QUALITY:  Adequate  Final   SOURCE: NOT GIVEN  Final   STATUS: FINAL  Final   GRAM STAIN: Gram positive cocci in chains  Final    Comment: No white blood cells seen No epithelial cells seen Many Gram negative bacilli Few Gram positive  cocci in chains   ISOLATE 1: Escherichia coli (A)  Final    Comment: Heavy growth of Escherichia coli   ISOLATE 2: Streptococcus agalactiae (A)  Final    Comment: Moderate growth of Group B Streptococcus isolated Beta-hemolytic Streptococci are predictably susceptible to penicillin and other beta-lactams. Susceptibility testing not routinely performed.      Susceptibility   Escherichia coli - AEROBIC CULT, GRAM STAIN NEGATIVE 1    AMOX/CLAVULANIC 4 Sensitive     AMPICILLIN 4 Sensitive     AMPICILLIN/SULBACTAM 4 Sensitive     CEFAZOLIN* <=4 Not Reportable      * For infections other than uncomplicated UTIcaused by E. coli, K. pneumoniae or P. mirabilis:Cefazolin is resistant if MIC > or = 8 mcg/mL.(Distinguishing susceptible versus intermediatefor isolates with MIC < or = 4 mcg/mL requiresadditional testing.)    CEFEPIME <=1 Sensitive     CEFTRIAXONE <=1 Sensitive     CIPROFLOXACIN <=0.25 Sensitive     LEVOFLOXACIN <=0.12 Sensitive     ERTAPENEM <=0.5 Sensitive     GENTAMICIN <=1 Sensitive     IMIPENEM <=0.25 Sensitive     PIP/TAZO <=4 Sensitive     TOBRAMYCIN <=1 Sensitive     TRIMETH/SULFA* <=20 Sensitive      * For infections other than uncomplicated UTIcaused by E. coli, K. pneumoniae or P. mirabilis:Cefazolin is resistant if MIC > or = 8 mcg/mL.(Distinguishing susceptible versus intermediatefor isolates with MIC < or = 4 mcg/mL requiresadditional testing.)Legend:S = Susceptible  I = IntermediateR = Resistant  NS = Not susceptible* = Not tested  NR = Not reported**NN = See antimicrobic comments  SARS Coronavirus 2 (Performed in [place] hospital lab)     Status: None   Collection Time: 05/13/19  1:00 PM   Specimen: Nasal Swab  Result Value Ref Range Status    SARS Coronavirus 2 NEGATIVE NEGATIVE Final    Comment: (NOTE) SARS-CoV-2 target nucleic acids are NOT DETECTED. The SARS-CoV-2 RNA is generally detectable in upper and lower respiratory specimens during the acute phase of infection. Negative results do not preclude SARS-CoV-2 infection, do not rule out co-infections with other pathogens, and should not be used as the sole basis for treatment or other patient management decisions. Negative results must be combined with clinical observations, patient history, and epidemiological information. The expected result is Negative. Fact Sheet for Patients: SugarRoll.be Fact Sheet for Healthcare Providers: https://www.woods-mathews.com/ This test is not yet approved or cleared by the [place] FDA and  has been authorized for detection and/or diagnosis of SARS-CoV-2 by FDA under an Emergency Use Authorization (EUA). This EUA will remain  in effect (meaning this test can be used) for the duration of the COVID-19 declaration under Section 56 4(b)(1) of the Act, 21 U.S.C. section 360bbb-3(b)(1), unless the authorization is terminated or revoked sooner. Performed at [facility], [place] [address] [place], [place] [address]   SARS Coronavirus 2 (CEPHEID - Performed in [place] hospital lab), Hosp Order     Status: None   Collection Time: 05/14/19  2:32 AM   Specimen: Nasopharyngeal Swab  Result Value Ref Range Status   SARS Coronavirus 2 NEGATIVE NEGATIVE Final    Comment: (NOTE) If result is NEGATIVE SARS-CoV-2 target nucleic acids are NOT DETECTED. The SARS-CoV-2 RNA is generally detectable in upper and lower  respiratory specimens during the acute phase of infection. The lowest  concentration of SARS-CoV-2 viral copies this assay can detect is 250  copies / mL. A negative  result does not preclude SARS-CoV-2 infection  and should not be used as the sole basis for treatment or other  patient  management decisions.  A negative result may occur with  improper specimen collection / handling, submission of specimen other  than nasopharyngeal swab, presence of viral mutation(s) within the  areas targeted by this assay, and inadequate number of viral copies  (<250 copies / mL). A negative result must be combined with clinical  observations, patient history, and epidemiological information. If result is POSITIVE SARS-CoV-2 target nucleic acids are DETECTED. The SARS-CoV-2 RNA is generally detectable in upper and lower  respiratory specimens dur ing the acute phase of infection.  Positive  results are indicative of active infection with SARS-CoV-2.  Clinical  correlation with patient history and other diagnostic information is  necessary to determine patient infection status.  Positive results do  not rule out bacterial infection or co-infection with other viruses. If result is PRESUMPTIVE POSTIVE SARS-CoV-2 nucleic acids MAY BE PRESENT.   A presumptive positive result was obtained on the submitted specimen  and confirmed on repeat testing.  While 2019 novel coronavirus  (SARS-CoV-2) nucleic acids may be present in the submitted sample  additional confirmatory testing may be necessary for epidemiological  and / or clinical management purposes  to differentiate between  SARS-CoV-2 and other Sarbecovirus currently known to infect humans.  If clinically indicated additional testing with an alternate test  methodology [phone]) is advised. The SARS-CoV-2 RNA is generally  detectable in upper and lower respiratory sp ecimens during the acute  phase of infection. The expected result is Negative. Fact Sheet for Patients:  StrictlyIdeas.no Fact Sheet for Healthcare Providers: BankingDealers.co.za This test is not yet approved or cleared by the [place] FDA and has been authorized for detection and/or diagnosis of SARS-CoV-2 by FDA under  an Emergency Use Authorization (EUA).  This EUA will remain in effect (meaning this test can be used) for the duration of the COVID-19 declaration under Section 564(b)(1) of the Act, 21 U.S.C. section 360bbb-3(b)(1), unless the authorization is terminated or revoked sooner. Performed at [facility], [place] [address] [place], [place] [address]      Radiological Exams on Admission: No results found.   EKG: Not done in ED, will get one.   Assessment/Plan Principal Problem:   Right foot infection Active Problems:   Essential hypertension   ESRD on dialysis ([place])   Diabetes mellitus with renal manifestation ([facility])   Diabetic foot ulcer- right second toe   Sepsis ([place])  Sepsis due to right foot Infection and diabetic foot ulcer- right second toe: Has fever, no leukocytosis.  Patient meets critical for sepsis with fever and tachycardia. Lactic acid normal 0.6.  Currently hemodynamically stable.  Dr. March Rummage is planning to do surgery in the morning.  - will place on Med-surg bed for obs - Empiric antimicrobial treatment with vancomycin Rocephin - PRN Zofran for nausea, morphine for pain - Blood cultures x 2  - ESR and CRP - wound care consult - will get Procalcitonin and trend lactic acid levels per sepsis protocol. - IVF: Will not give any IV fluids due to normal lactic acid and ESRD - f/u right LE venous doppler to r/o DVT  Essential hypertension: Not taking medications at home.  Blood pressure 138/72 -IV hydralazine as needed  ESRD on dialysis ([facility]): -please consult renal in AM  Diabetes mellitus with renal manifestation ([place]): Last A1c 8.8, poorly controled. Patient is not taking medications at home.  Blood sugar 136. -SSI    DVT ppx: SCD only to left leg Code Status: Full code Family Communication: None at bed side.    Disposition Plan:  Anticipate discharge back to previous home environment Consults called: Podiatrist, Dr. March Rummage Admission status: medical  floor/obs      Date of Service 05/14/2019    [place]   If 7PM-7AM, please contact night-coverage [email or web address] Password [facility] 05/14/2019, 6:03 AM

## 2019-05-14 NOTE — Progress Notes (Addendum)
0900 Pt is A&O x4,  to short stay, NPO maint. Report was given to [place]. 10 Pt's wife Otila Kluver, updated of plan of care. 1230 Received pt from PACU, A&O x4. Right foot dressing dry and intact. Able to move toes.

## 2019-05-14 NOTE — Progress Notes (Signed)
Asked to see patient for HD Monday.  Labs / pt are stable, has not missed any HD. Will see tomorrow if going to be here for HD on Monday.   Kelly Splinter, MD 05/14/2019, 2:33 PM

## 2019-05-14 NOTE — Plan of Care (Signed)
  Problem: Education: Goal: Knowledge of General Education information will improve Description: Including pain rating scale, medication(s)/side effects and non-pharmacologic comfort measures Outcome: Progressing   Problem: Health Behavior/Discharge Planning: Goal: Ability to manage health-related needs will improve Outcome: Progressing   Problem: Clinical Measurements: Goal: Will remain free from infection Outcome: Progressing   Problem: Activity: Goal: Risk for activity intolerance will decrease Outcome: Progressing   Problem: Pain Managment: Goal: General experience of comfort will improve Outcome: Progressing   Problem: Skin Integrity: Goal: Risk for impaired skin integrity will decrease Outcome: Progressing

## 2019-05-14 NOTE — Progress Notes (Signed)
Pharmacy Antibiotic Note  [name] is a 53 y.o. male admitted on 05/13/2019 with diabetic foot infection after L foot injury. Pharmacy has been consulted for vancomycin dosing. Pt has hx of ESRD on HD MWF, last HD unknown.  Plan: -Vancomycin 2000mg  IV x1 already ordered by EDP -Vancomycin 1000mg  IV qHD - not scheduled, F/U HD schedule -Ceftriaxone 1g x1 by EDP   Height: 5\' 10"  (177.8 cm) Weight: 216 lb 0.8 oz (98 kg) IBW/kg (Calculated) : 73  Temp (24hrs), Avg:100.3 F (37.9 C), Min:99.3 F (37.4 C), Max:101.2 F (38.4 C)  Recent Labs  Lab 05/13/19 1928 05/13/19 1940  WBC 9.9  --   CREATININE 7.69*  --   LATICACIDVEN  --  0.6    Estimated Creatinine Clearance: 13 mL/min (A) (by C-G formula based on SCr of 7.69 mg/dL (H)).    Allergies  Allergen Reactions  . Amoxicillin Itching    Did it involve swelling of the face/tongue/throat, SOB, or low BP? Unknown Did it involve sudden or severe rash/hives, skin peeling, or any reaction on the inside of your mouth or nose? Unknown Did you need to seek medical attention at a hospital or doctor's office? Unknown When did it last happen?20 years ago If all above answers are "NO", may proceed with cephalosporin use.   Marland Kitchen Hydromorphone Itching    Patient states he may take with benadryl. Makes feet itch.  Marland Kitchen Percocet [Oxycodone-Acetaminophen] Itching and Other (See Comments)    Legs only. Itching and burning feeling.    Antimicrobials this admission: Ceftriaxone 7/24 x1 Vancomycin 7/25 >>   Dose adjustments this admission: none  Microbiology results: sent  Thank you for allowing pharmacy to be a part of this patient's care.   Arrie Senate, PharmD, BCPS Clinical Pharmacist Please check AMION for all [facility] Pharmacy numbers 05/14/2019

## 2019-05-14 NOTE — Progress Notes (Signed)
Patient admitted by Dr Blaine Hamper.  Patient admitted for osteomyelitis of the right great toe, second toe with some necrotic bone.  On IV antibiotics. Taken for bone biopsy and amputation of the great toe.  Vital signs stable  Postop require pain control.  Continue IV antibiotics until culture data becomes available.  Will tailor antibiotics accordingly.  Call with questions as needed.  [name] Ren MD [facility]

## 2019-05-14 NOTE — Transfer of Care (Signed)
Immediate Anesthesia Transfer of Care Note  Patient: [name]  Procedure(s) Performed: PARTIAL AMPUTATION SECOND TOE RIGHT FOOT (Right ) OPEN SUPERFICIAL BONE BIOPSY GREAT TOE (Right Toe)  Patient Location: PACU  Anesthesia Type:General  Level of Consciousness: awake and patient cooperative  Airway & Oxygen Therapy: Patient Spontanous Breathing and Patient connected to face mask oxygen  Post-op Assessment: Report given to RN and Post -op Vital signs reviewed and stable  Post vital signs: Reviewed and stable  Last Vitals:  Vitals Value Taken Time  BP    Temp    Pulse    Resp    SpO2      Last Pain:  Vitals:   05/14/19 0506  TempSrc: Oral  PainSc:          Complications: No apparent anesthesia complications

## 2019-05-14 NOTE — Op Note (Signed)
Patient Name: [name] DOB: 1966/05/05  MRN: [id number]   Date of Service: 05/13/2019 - 05/14/2019  Surgeon: Dr. Hardie Pulley, DPM Assistants: None Pre-operative Diagnosis:  1) Osteomyelitis Right Great Toe 2) Osteomyelitis Right 2nd Toe 3) Ulcer Right 2nd Toe with Necrosis of Bone Post-operative Diagnosis:  Sane Procedures:  1) right great toe bone biopsy  2) right great toe amputation interphalangeal joint  Pathology/Specimens: ID Type Source Tests Collected by Time Destination  1 : Bone 1st Phalanx Distal  Tissue Bone SURGICAL PATHOLOGY Evelina Bucy, DPM 05/14/2019 1116   2 : Right 2nd Toe  Amputation Toe, Right SURGICAL PATHOLOGY Evelina Bucy, DPM 05/14/2019 1118   A : Bone 1st Distal Phalanx Culture  Tissue Bone AEROBIC/ANAEROBIC CULTURE (SURGICAL/DEEP WOUND) Evelina Bucy, DPM 05/14/2019 1120   B : Soft Tissue Right Second Toe  Tissue Soft Tissue, Other AEROBIC/ANAEROBIC CULTURE (SURGICAL/DEEP WOUND) Evelina Bucy, DPM 05/14/2019 1121    Anesthesia: General Hemostasis: * No tourniquets in log * Estimated Blood Loss: 10 mL Materials: * No implants in log * Medications: 1 g vancomycin powder  Complications: None  Indications for Procedure:  This is a 53 y.o. male with with a necrotic wound to the right second toe with osteomyelitis and chronic osteomyelitis of the right great toe.  It was discussed the patient would benefit from amputation of the right second toe.  We discussed partial versus total amputation of the digit patient was amenable to either however we discussed trying to save part of the digit if possible.  We additionally discussed given signs of chronic osteomyelitis on x-ray performing a bone biopsy of the great toe for possible antibiotic therapy   Procedure in Detail: Patient was identified in pre-operative holding area. Formal consent was signed and the right lower extremity was marked. Patient was brought back to the operating room. Anesthesia was  induced. The extremity was prepped and draped in the usual sterile fashion. Timeout was taken to confirm patient name, laterality, and procedure prior to incision.   Attention was then directed to the right great toe.  A linear incision was made at the medial aspect of the distal phalanx the incision was opened with a hemostat.  A Jamshidi needle was used to take a plug of the distal phalanx bone.  The distal phalanx bone was incredibly soft and readily gave way to the Jamshidi needle.  The counts of the needle were emptied and additional sample was taken.  The samples were divided for both micro and pathology.  The incision was then copiously irrigated and closed with 4-0 nylon.  Attention was then directed to the right second toe.  A fishmouth incision was made about the distal aspect of the toe.  Dissection was carried down to skin subcu tissue with care to avoid all vital neurovascular structures.  The distal phalanx was black necrotic under significant necrotic tissue.  There was scant purulence.  Dissection carried down to level of the proximal phalangeal joint.  There was some necrosis of the subcutaneous tissues around the phalangeal joint however the proximal phalanx appeared healthy and viable.  The soft tissue was excisionally debrided with a rongeur.  The wound was irrigated with 3 L of normal saline.  A soft tissue culture was then collected with a rondure soft tissue.  The area was then further irrigated with additional 1.5 L of normal saline.  The wound was then packed with vancomycin powder and the skin edges were then remodeled.  Good  bleeding viable edges of the skin were noted.  The skin edges were then closed with 4-0 nylon and skin staples.  The foot was then dressed with Xeroform 4 x 4 Kerlix and Ace bandage. Patient tolerated the procedure well.   Disposition: Following a period of post-operative monitoring, patient will be transferred back to the floor.  He will benefit from  continued IV antibiotics.  We will follow the cultures for determination of what antibiotics he needs to be on for long-term antibiotic therapy

## 2019-05-14 NOTE — Anesthesia Procedure Notes (Signed)
Procedure Name: LMA Insertion Date/Time: 05/14/2019 10:24 AM Performed by: Orlie Dakin, CRNA Pre-anesthesia Checklist: Patient identified, Emergency Drugs available, Suction available and Patient being monitored Patient Re-evaluated:Patient Re-evaluated prior to induction Oxygen Delivery Method: Circle system utilized Preoxygenation: Pre-oxygenation with 100% oxygen Induction Type: IV induction Ventilation: Mask ventilation without difficulty LMA: LMA inserted LMA Size: 4.0 Tube type: Oral Number of attempts: 1 Placement Confirmation: positive ETCO2 Tube secured with: Tape Dental Injury: Teeth and Oropharynx as per pre-operative assessment

## 2019-05-14 NOTE — ED Provider Notes (Signed)
TIME SEEN: 2:21 AM  CHIEF COMPLAINT: Right foot pain, right calf pain  HPI: Patient is a 53 year old male with history of hypertension, diabetes controlled by diet, end-stage renal disease on hemodialysis who presents to the emergency department with complaints of right foot pain, right calf pain.  States on July 4 he injured the right second toe and had a blister that popped.  States that the wound progressively was getting worse and the patient's foot is hot, red and swollen with pain in the ankle and pain in the posterior calf.  He was seen by his podiatrist Dr. Hardie Pulley today who sent him to the emergency department for admission and plans to take him to the operating room for amputation of this toe tomorrow per patient's report.  He states he had x-rays in the office today that were unremarkable.  He has never had a DVT.  No chest pain or shortness of breath.  Did have fever of 101 today.  No cough.  ROS: See HPI Constitutional: no fever  Eyes: no drainage  ENT: no runny nose   Cardiovascular:  no chest pain  Resp: no SOB  GI: no vomiting GU: no dysuria Integumentary: no rash  Allergy: no hives  Musculoskeletal: no leg swelling  Neurological: no slurred speech ROS otherwise negative  PAST MEDICAL HISTORY/PAST SURGICAL HISTORY:  Past Medical History:  Diagnosis Date  . Chills    at night - sometimes  . Complication of anesthesia   . Diabetes mellitus    controlled by diet  . 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 Eczema   . Fatigue    loss of fatigue  . Headache(784.0)    Years ago  . Heart murmur    years ago  . History of blood transfusion   . Hypertension    sees Dr. Jaci Standard  . MRSA (methicillin resistant staph aureus) culture positive   . PONV (postoperative nausea and vomiting)   . Poor circulation   . Renal disorder     MEDICATIONS:  Prior to Admission medications   Medication Sig Start Date End Date Taking? Authorizing Provider   acetaminophen (TYLENOL) 500 MG tablet Take 1,000 mg by mouth every 6 (six) hours as needed for moderate pain or headache.    [provider]  blood glucose meter kit and supplies Dispense based on patient and insurance preference. Use up to four times daily as directed. (FOR ICD-9 250.00, 250.01). 05/06/15   Hongalgi, Lenis Dickinson, MD  clindamycin (CLEOCIN) 300 MG capsule Take 1 capsule (300 mg total) by mouth 2 (two) times daily. 05/06/19   Evelina Bucy, DPM  ferric citrate (AURYXIA) 1 GM 210 MG(Fe) tablet Take 210-630 mg by mouth See admin instructions. Take 420 to 630 mg with each meal depending on the size of the meal, and take 210 mg with each snack    [provider]  glucose blood (ACCU-CHEK AVIVA PLUS) test strip Use as instructed 05/10/15   Charlott Rakes, MD  Lancet Devices (ACCU-CHEK [facility]) lancets Use as instructed 05/10/15   Charlott Rakes, MD  lidocaine (XYLOCAINE) 2 % jelly Apply 1 application topically as needed. Patient not taking: Reported on 05/12/2019 12/04/18   Vanessa Kick, MD  loratadine (CLARITIN) 10 MG tablet Take 10 mg by mouth daily as needed for allergies.    [provider]  Melatonin 5 MG CAPS Take 10 mg by mouth at bedtime as needed (sleep).    [provider]  multivitamin (RENA-VIT) TABS tablet  Take 1 tablet by mouth every Monday, Wednesday, and Friday. After dialysis    [provider]    ALLERGIES:  Allergies  Allergen Reactions  . Amoxicillin Itching    Did it involve swelling of the face/tongue/throat, SOB, or low BP? Unknown Did it involve sudden or severe rash/hives, skin peeling, or any reaction on the inside of your mouth or nose? Unknown Did you need to seek medical attention at a hospital or doctor's office? Unknown When did it last happen?20 years ago If all above answers are "NO", may proceed with cephalosporin use.   Marland Kitchen Hydromorphone Itching    Patient states he may take with benadryl. Makes feet  itch.  Marland Kitchen Percocet [Oxycodone-Acetaminophen] Itching and Other (See Comments)    Legs only. Itching and burning feeling.    SOCIAL HISTORY:  Social History   Tobacco Use  . Smoking status: Never Smoker  . Smokeless tobacco: Never Used  Substance Use Topics  . Alcohol use: No    FAMILY HISTORY: Family History  Problem Relation Age of Onset  . Diabetes Mother     EXAM: BP 138/72   Pulse 90   Temp 99.3 F (37.4 C) (Oral)   Resp 18   Ht '5\' 10"'  (1.778 m)   Wt 98 kg   SpO2 99%   BMI 31.00 kg/m  CONSTITUTIONAL: Alert and oriented and responds appropriately to questions. Well-appearing; well-nourished HEAD: Normocephalic EYES: Conjunctivae clear, pupils appear equal, EOMI ENT: normal nose; moist mucous membranes NECK: Supple, no meningismus, no nuchal rigidity, no LAD  CARD: RRR; S1 and S2 appreciated; no murmurs, no clicks, no rubs, no gallops RESP: Normal chest excursion without splinting or tachypnea; breath sounds clear and equal bilaterally; no wheezes, no rhonchi, no rales, no hypoxia or respiratory distress, speaking full sentences ABD/GI: Normal bowel sounds; non-distended; soft, non-tender, no rebound, no guarding, no peritoneal signs, no hepatosplenomegaly BACK:  The back appears normal and is non-tender to palpation, there is no CVA tenderness EXT: Patient has significant swelling to the right foot, ankle and tenderness throughout the right posterior calf.  This area is red and warm.  He has superficial wound to the dorsal proximal aspect of the right second toe and the gauze that was surrounding his toe is covered in foul-smelling purulent and bloody drainage.  He does have dopplerable DP and PT pulses in this right leg.  He appears to have necrosis at the tip of the right second toe. SKIN: Normal color for age and race; warm; no rash NEURO: Moves all extremities equally PSYCH: The patient's mood and manner are appropriate. Grooming and personal hygiene are  appropriate.  MEDICAL DECISION MAKING: Patient here with what appears to be wet gangrene and cellulitis.  Sent here for admission and IV antibiotics.  Labs unremarkable other than chronic kidney disease and anemia of chronic kidney disease.  Will give vancomycin, Rocephin.  Will obtain blood cultures.  He reports x-rays were performed by his podiatrist today.  Will admit to medicine.  Will obtain venous Doppler of his right lower extremity in the morning when vascular ultrasound is available.  Given morphine for pain control.  ED PROGRESS:    2:48 AM Discussed patient's case with hospitalist, Dr. Blaine Hamper.  I have recommended admission and patient (and family if present) agree with this plan. Admitting physician will place admission orders.   I reviewed all nursing notes, vitals, pertinent previous records, EKGs, lab and urine results, imaging (as available).      Nataleigh Griffin,  Delice Bison, DO 05/14/19 [phone]

## 2019-05-14 NOTE — Progress Notes (Signed)
VASCULAR LAB PRELIMINARY  PRELIMINARY  PRELIMINARY  PRELIMINARY  Right lower extremity venous duplex completed.    Preliminary report:  See CV proc for preliminary results.   Artemis Loyal, RVT 05/14/2019, 9:04 AM

## 2019-05-15 ENCOUNTER — Inpatient Hospital Stay (HOSPITAL_COMMUNITY): Payer: Medicare Other

## 2019-05-15 DIAGNOSIS — Z20828 Contact with and (suspected) exposure to other viral communicable diseases: Secondary | ICD-10-CM | POA: Diagnosis present

## 2019-05-15 DIAGNOSIS — M79661 Pain in right lower leg: Secondary | ICD-10-CM | POA: Diagnosis present

## 2019-05-15 DIAGNOSIS — B9620 Unspecified Escherichia coli [E. coli] as the cause of diseases classified elsewhere: Secondary | ICD-10-CM | POA: Diagnosis present

## 2019-05-15 DIAGNOSIS — K30 Functional dyspepsia: Secondary | ICD-10-CM | POA: Diagnosis not present

## 2019-05-15 DIAGNOSIS — E785 Hyperlipidemia, unspecified: Secondary | ICD-10-CM | POA: Diagnosis present

## 2019-05-15 DIAGNOSIS — N2581 Secondary hyperparathyroidism of renal origin: Secondary | ICD-10-CM | POA: Diagnosis present

## 2019-05-15 DIAGNOSIS — E669 Obesity, unspecified: Secondary | ICD-10-CM | POA: Diagnosis present

## 2019-05-15 DIAGNOSIS — Z992 Dependence on renal dialysis: Secondary | ICD-10-CM | POA: Diagnosis not present

## 2019-05-15 DIAGNOSIS — M86171 Other acute osteomyelitis, right ankle and foot: Secondary | ICD-10-CM | POA: Diagnosis present

## 2019-05-15 DIAGNOSIS — L0390 Cellulitis, unspecified: Secondary | ICD-10-CM

## 2019-05-15 DIAGNOSIS — E11621 Type 2 diabetes mellitus with foot ulcer: Secondary | ICD-10-CM | POA: Diagnosis present

## 2019-05-15 DIAGNOSIS — Z6831 Body mass index (BMI) 31.0-31.9, adult: Secondary | ICD-10-CM | POA: Diagnosis not present

## 2019-05-15 DIAGNOSIS — E1169 Type 2 diabetes mellitus with other specified complication: Secondary | ICD-10-CM | POA: Diagnosis present

## 2019-05-15 DIAGNOSIS — M869 Osteomyelitis, unspecified: Secondary | ICD-10-CM | POA: Diagnosis not present

## 2019-05-15 DIAGNOSIS — D631 Anemia in chronic kidney disease: Secondary | ICD-10-CM | POA: Diagnosis present

## 2019-05-15 DIAGNOSIS — I10 Essential (primary) hypertension: Secondary | ICD-10-CM | POA: Diagnosis not present

## 2019-05-15 DIAGNOSIS — A419 Sepsis, unspecified organism: Secondary | ICD-10-CM | POA: Diagnosis present

## 2019-05-15 DIAGNOSIS — K5900 Constipation, unspecified: Secondary | ICD-10-CM | POA: Diagnosis not present

## 2019-05-15 DIAGNOSIS — M86071 Acute hematogenous osteomyelitis, right ankle and foot: Secondary | ICD-10-CM | POA: Diagnosis not present

## 2019-05-15 DIAGNOSIS — I70203 Unspecified atherosclerosis of native arteries of extremities, bilateral legs: Secondary | ICD-10-CM | POA: Diagnosis present

## 2019-05-15 DIAGNOSIS — N186 End stage renal disease: Secondary | ICD-10-CM | POA: Diagnosis present

## 2019-05-15 DIAGNOSIS — D509 Iron deficiency anemia, unspecified: Secondary | ICD-10-CM | POA: Diagnosis present

## 2019-05-15 DIAGNOSIS — M86671 Other chronic osteomyelitis, right ankle and foot: Secondary | ICD-10-CM | POA: Diagnosis present

## 2019-05-15 DIAGNOSIS — I120 Hypertensive chronic kidney disease with stage 5 chronic kidney disease or end stage renal disease: Secondary | ICD-10-CM | POA: Diagnosis present

## 2019-05-15 DIAGNOSIS — E1129 Type 2 diabetes mellitus with other diabetic kidney complication: Secondary | ICD-10-CM | POA: Diagnosis not present

## 2019-05-15 DIAGNOSIS — I96 Gangrene, not elsewhere classified: Secondary | ICD-10-CM

## 2019-05-15 DIAGNOSIS — D62 Acute posthemorrhagic anemia: Secondary | ICD-10-CM | POA: Diagnosis not present

## 2019-05-15 DIAGNOSIS — E875 Hyperkalemia: Secondary | ICD-10-CM | POA: Diagnosis not present

## 2019-05-15 DIAGNOSIS — E11319 Type 2 diabetes mellitus with unspecified diabetic retinopathy without macular edema: Secondary | ICD-10-CM | POA: Diagnosis present

## 2019-05-15 DIAGNOSIS — E1122 Type 2 diabetes mellitus with diabetic chronic kidney disease: Secondary | ICD-10-CM | POA: Diagnosis present

## 2019-05-15 DIAGNOSIS — E1152 Type 2 diabetes mellitus with diabetic peripheral angiopathy with gangrene: Secondary | ICD-10-CM | POA: Diagnosis present

## 2019-05-15 HISTORY — DX: Other acute osteomyelitis, right ankle and foot: M86.171

## 2019-05-15 LAB — CBC
HCT: 23.4 % — ABNORMAL LOW (ref 39.0–52.0)
Hemoglobin: 7.5 g/dL — ABNORMAL LOW (ref 13.0–17.0)
MCH: 28 pg (ref 26.0–34.0)
MCHC: 32.1 g/dL (ref 30.0–36.0)
MCV: 87.3 fL (ref 80.0–100.0)
Platelets: 303 10*3/uL (ref 150–400)
RBC: 2.68 MIL/uL — ABNORMAL LOW (ref 4.22–5.81)
RDW: 16.5 % — ABNORMAL HIGH (ref 11.5–15.5)
WBC: 8.6 10*3/uL (ref 4.0–10.5)
nRBC: 0 % (ref 0.0–0.2)

## 2019-05-15 LAB — HEMOGLOBIN A1C
Hgb A1c MFr Bld: 8 % — ABNORMAL HIGH (ref 4.8–5.6)
Mean Plasma Glucose: 182.9 mg/dL

## 2019-05-15 LAB — LIPID PANEL
Cholesterol: 231 mg/dL — ABNORMAL HIGH (ref 0–200)
HDL: 43 mg/dL (ref >40)
LDL Cholesterol: 141 mg/dL — ABNORMAL HIGH (ref 0–99)
Total CHOL/HDL Ratio: 5.4 RATIO
Triglycerides: 236 mg/dL — ABNORMAL HIGH (ref <150)
VLDL: 47 mg/dL — ABNORMAL HIGH (ref 0–40)

## 2019-05-15 LAB — HIV ANTIBODY (ROUTINE TESTING W REFLEX): HIV Screen 4th Generation wRfx: NONREACTIVE

## 2019-05-15 LAB — MAGNESIUM: Magnesium: 2.6 mg/dL — ABNORMAL HIGH (ref 1.7–2.4)

## 2019-05-15 LAB — GLUCOSE, CAPILLARY
Glucose-Capillary: 162 mg/dL — ABNORMAL HIGH (ref 70–99)
Glucose-Capillary: 108 mg/dL — ABNORMAL HIGH (ref 70–99)
Glucose-Capillary: 117 mg/dL — ABNORMAL HIGH (ref 70–99)
Glucose-Capillary: 151 mg/dL — ABNORMAL HIGH (ref 70–99)

## 2019-05-15 MED ORDER — VANCOMYCIN HCL IN DEXTROSE 1-5 GM/200ML-% IV SOLN
1000.0000 mg | INTRAVENOUS | Status: DC
  Administered 2019-05-16: 1000 mg via INTRAVENOUS
  Filled 2019-05-15: qty 200

## 2019-05-15 MED ORDER — DOCUSATE SODIUM 100 MG PO CAPS
100.0000 mg | ORAL_CAPSULE | Freq: Two times a day (BID) | ORAL | Status: DC
  Administered 2019-05-15 – 2019-05-16 (×3): 100 mg via ORAL
  Filled 2019-05-15 (×3): qty 1

## 2019-05-15 MED ORDER — DOXERCALCIFEROL 4 MCG/2ML IV SOLN
6.0000 ug | INTRAVENOUS | Status: DC
  Filled 2019-05-15 (×2): qty 4

## 2019-05-15 MED ORDER — HEPARIN SODIUM (PORCINE) 5000 UNIT/ML IJ SOLN
5000.0000 [IU] | Freq: Three times a day (TID) | INTRAMUSCULAR | Status: DC
  Administered 2019-05-15: 5000 [IU] via SUBCUTANEOUS
  Filled 2019-05-15 (×3): qty 1

## 2019-05-15 MED ORDER — PRO-STAT SUGAR FREE PO LIQD
30.0000 mL | Freq: Two times a day (BID) | ORAL | Status: DC
  Administered 2019-05-15 – 2019-05-16 (×2): 30 mL via ORAL
  Filled 2019-05-15 (×4): qty 30

## 2019-05-15 MED ORDER — SEVELAMER CARBONATE 2.4 G PO PACK
2.4000 g | PACK | Freq: Three times a day (TID) | ORAL | Status: DC
  Administered 2019-05-15 – 2019-05-18 (×3): 2.4 g via ORAL
  Filled 2019-05-15 (×11): qty 1

## 2019-05-15 MED ORDER — DARBEPOETIN ALFA 100 MCG/0.5ML IJ SOSY
100.0000 ug | PREFILLED_SYRINGE | INTRAMUSCULAR | Status: DC
  Filled 2019-05-15: qty 0.5

## 2019-05-15 MED ORDER — CHLORHEXIDINE GLUCONATE CLOTH 2 % EX PADS
6.0000 | MEDICATED_PAD | Freq: Every day | CUTANEOUS | Status: DC
  Administered 2019-05-15 – 2019-05-18 (×3): 6 via TOPICAL

## 2019-05-15 NOTE — Plan of Care (Signed)
  Problem: Education: Goal: Knowledge of General Education information will improve Description: Including pain rating scale, medication(s)/side effects and non-pharmacologic comfort measures Outcome: Progressing   Problem: Health Behavior/Discharge Planning: Goal: Ability to manage health-related needs will improve Outcome: Progressing   Problem: Clinical Measurements: Goal: Will remain free from infection Outcome: Progressing   Problem: Activity: Goal: Risk for activity intolerance will decrease Outcome: Progressing   Problem: Pain Managment: Goal: General experience of comfort will improve Outcome: Progressing   Problem: Skin Integrity: Goal: Risk for impaired skin integrity will decrease Outcome: Progressing   Problem: Safety: Goal: Ability to remain free from injury will improve Outcome: Progressing

## 2019-05-15 NOTE — Consult Note (Signed)
[facility] Nurse wound consult note Reason for Consult: Right foot second toe with wet gangrene, osteomyelitis or RGT.  Seen by Dr. March Rummage (Podiatric Medicine) and taken to OR yesterday. No role for WOC nursing at this time.  Please refer to Dr. March Rummage for post operative care and recommendations.  [facility] nursing team will not follow, but will remain available to this patient, the nursing and medical teams.  Please re-consult if needed. Thanks, Maudie Flakes, MSN, RN, Tat Momoli, Arther Abbott  Pager# [phone]

## 2019-05-15 NOTE — Consult Note (Addendum)
[facility] Renal Consultation Note    Indication for Consultation:  Management of ESRD/hemodialysis, anemia, hypertension/volume, and secondary hyperparathyroidism. PCP:  HPI: [name] is a 53 y.o. 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  Pt reports that he has been having R foot pains for nearly 1 mo now. Around the weekend of July 4th, he noted a "blister" on his R 2nd toe. Continued to walk on it and "doctor on it by myself". He was evaluated by podiatry on 7/17 and noted to have R 1st and 2nd toe ulcerations. Xray showed osteolysis of distal 1st/2nd phalanx. This wound was debrided and plan was for home health dressing changes. On 7/24, he returned for f/u appointment. Toe was worse in appearance with odor, and he was noted to be febrile and tachycardic -> he was sent to the ED for admission and planned amputation. Of note, he did not inform any of his dialysis care team of the above despite being evaluated by MD + PA's multiple times during this time frame and he refused RN monthly foot check - he reports "I didn't want any of the other patients knowing my business" regarding lack of privacy to discuss sensitive issues while on dialysis machine.  In the ED, labs showed Na 137, K 4.2, Ca 9.2, WBC 9.9, Hgb 8.6, CRP 22.6. COVID-19 testing negative. LE u/s negative for DVT. Blood and wound Cx collected and he was started on Vanc and Ceftriaxone.  He underwent partial amputation of R 2nd toe and R great toe bone biopsy on 7/25.  Today, he is having some R foot pain and he is upset with himself that he let it get this bad. Wants to make sure 100% that he has good blood flow to feet, worried about future amputations. Denies CP, dyspnea, N/V, diarrhea, or fever.  Dialyzes on MWF schedule at [facility] center. He is very compliant with his treatments, last HD Fri 7/24. He is due for next HD  tomorrow.  Past Medical History:  Diagnosis Date  . Chills    at night - sometimes  . Complication of anesthesia   . Diabetes mellitus    controlled by diet  . 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 Eczema   . Fatigue    loss of fatigue  . Headache(784.0)    Years ago  . Heart murmur    years ago  . History of blood transfusion   . Hypertension    sees Dr. Jaci Standard  . MRSA (methicillin resistant staph aureus) culture positive   . PONV (postoperative nausea and vomiting)   . Poor circulation   . Renal disorder    Past Surgical History:  Procedure Laterality Date  . AV FISTULA PLACEMENT  06-18-11   Right brachiocephalic AVF  . EYE SURGERY     left eye for Laser, diabetic retinopathy  . FISTULOGRAM Right 11/06/2011   Procedure: FISTULOGRAM;  Surgeon: Conrad Paint, MD;  Location: [facility] CATH LAB;  Service: Cardiovascular;  Laterality: Right;  . HEMATOMA EVACUATION Right Feb. 25, 2014  . HEMATOMA EVACUATION Right 12/14/2012   Procedure: EVACUATION HEMATOMA;  Surgeon: Conrad Belleville, MD;  Location: [place];  Service: Vascular;  Laterality: Right;  . INSERTION OF DIALYSIS CATHETER  12/14/2012   Procedure: INSERTION OF DIALYSIS CATHETER;  Surgeon: Conrad Dover, MD;  Location: [place];  Service: Vascular;;  . REVISON OF  ARTERIOVENOUS FISTULA Right 12/14/2012   Procedure: REVISON OF upper arm ARTERIOVENOUS FISTULA using 89mx10cm gortex graft;  Surgeon: BConrad Lake Holiday MD;  [place]  Service: Vascular;  Laterality: Right;  . SOFT TISSUE MASS EXCISION     Right arm, Left leg  for MRSA infection   Family History  Problem Relation Age of Onset  . Diabetes Mother    Social History:  reports that he has never smoked. He has never used smokeless tobacco. He reports that he does not drink alcohol or use drugs.  ROS: As per HPI otherwise negative.  Physical Exam: Vitals:   05/14/19 1924 05/15/19 0013 05/15/19 0420 05/15/19 0737  BP: 134/71 (!) 148/78 (!) 157/83 136/67   Pulse: (!) 101 (!) 102 (!) 102 95  Resp: _0 Temp: 98.8 F (37.1 C) 99.1 F (37.3 C) 98.4 F (36.9 C) 99.2 F (37.3 C)  TempSrc: Oral Oral Oral Oral  SpO2: 100% 97% 94% 97%  Weight:      Height:         General: Well developed, well nourished, in no acute distress. Head: Normocephalic, atraumatic, sclera non-icteric, mucus membranes are moist. Neck: Supple without lymphadenopathy/masses. JVD not elevated. Lungs: Clear bilaterally to auscultation without wheezes, rales, or rhonchi. Breathing is unlabored. Heart: RRR with normal S1, S2. No murmurs, rubs, or gallops appreciated. Abdomen: Soft, non-tender, non-distended with normoactive bowel sounds. No rebound/guarding. Musculoskeletal:  Strength and tone appear normal for age. Lower extremities: No LLE edema or wounds noted. RLE bandaged s/p partial R toe amp. Neuro: Alert and oriented X 3. Moves all extremities spontaneously. Psych:  Responds to questions appropriately with a normal affect. Dialysis Access: AVF + bruit/thrill  Allergies  Allergen Reactions  . Amoxicillin Itching    Did it involve swelling of the face/tongue/throat, SOB, or low BP? Unknown Did it involve sudden or severe rash/hives, skin peeling, or any reaction on the inside of your mouth or nose? Unknown Did you need to seek medical attention at a hospital or doctor's office? Unknown When did it last happen?20 years ago If all above answers are "NO", may proceed with cephalosporin use.   .Marland KitchenHydromorphone Itching    Patient states he may take with benadryl. Makes feet itch.  .Marland KitchenPercocet [Oxycodone-Acetaminophen] Itching and Other (See Comments)    Legs only. Itching and burning feeling.   Prior to Admission medications   Medication Sig Start Date End Date Taking? Authorizing Provider  acetaminophen (TYLENOL) 500 MG tablet Take 1,000 mg by mouth every 6 (six) hours as needed for moderate pain or headache.   Yes [provider]   clindamycin (CLEOCIN) 300 MG capsule Take 1 capsule (300 mg total) by mouth 2 (two) times daily. 05/06/19  Yes PEvelina Bucy DPM  ferric citrate (AURYXIA) 1 GM 210 MG(Fe) tablet Take 210-630 mg by mouth See admin instructions. Take 420 to 630 mg with each meal depending on the size of the meal, and take 210 mg with each snack   Yes [provider]  loratadine (CLARITIN) 10 MG tablet Take 10 mg by mouth daily as needed for allergies.   Yes [provider]  Melatonin 5 MG CAPS Take 10 mg by mouth at bedtime as needed (sleep).   Yes [provider]  multivitamin (RENA-VIT) TABS tablet Take 1 tablet by mouth every Monday, Wednesday, and Friday. After dialysis   Yes [provider]  blood glucose meter kit and supplies Dispense based on patient  and insurance preference. Use up to four times daily as directed. (FOR ICD-9 250.00, 250.01). 05/06/15   Hongalgi, Everlene Farrier D, MD  glucose blood (ACCU-CHEK AVIVA PLUS) test strip Use as instructed 05/10/15   Charlott Rakes, MD  Lancet Devices (ACCU-CHEK [facility]) lancets Use as instructed 05/10/15   Charlott Rakes, MD  lidocaine (XYLOCAINE) 2 % jelly Apply 1 application topically as needed. Patient not taking: Reported on 05/12/2019 12/04/18   Vanessa Kick, MD   Current Facility-Administered Medications  Medication Dose Route Frequency Provider Last Rate Last Dose  . acetaminophen (TYLENOL) tablet 650 mg  650 mg Oral Q6H PRN Evelina Bucy, DPM       Or  . acetaminophen (TYLENOL) suppository 650 mg  650 mg Rectal Q6H PRN Evelina Bucy, DPM      . alum & mag hydroxide-simeth (MAALOX/MYLANTA) 200-200-20 MG/5ML suspension 30 mL  30 mL Oral Q4H PRN Damita Lack, MD   30 mL at 05/15/19 0119  . cefTRIAXone (ROCEPHIN) 1 g in sodium chloride 0.9 % 100 mL IVPB  1 g Intravenous Q24H Evelina Bucy, DPM 200 mL/hr at 05/14/19 2246 1 g at 05/14/19 2246  . diphenhydrAMINE (BENADRYL) capsule 25 mg  25 mg Oral Q6H PRN Evelina Bucy, DPM   25 mg at 05/14/19 2239  . hydrALAZINE (APRESOLINE) injection 5 mg  5 mg Intravenous Q2H PRN Evelina Bucy, DPM      . hydrocortisone (ANUSOL-HC) 2.5 % rectal cream 1 application  1 application Topical QID PRN Amin, Ankit Chirag, MD      . hydrocortisone cream 1 % 1 application  1 application Topical TID PRN Amin, Ankit Chirag, MD      . insulin aspart (novoLOG) injection 0-5 Units  0-5 Units Subcutaneous QHS Evelina Bucy, DPM      . insulin aspart (novoLOG) injection 0-9 Units  0-9 Units Subcutaneous TID WC Evelina Bucy, DPM   2 Units at 05/15/19 [phone]  . lip balm (CARMEX) ointment 1 application  1 application Topical PRN Amin, Ankit Chirag, MD      . loratadine (CLARITIN) tablet 10 mg  10 mg Oral Daily PRN Evelina Bucy, DPM      . Melatonin TABS 9 mg  9 mg Oral QHS PRN Evelina Bucy, DPM      . morphine 2 MG/ML injection 2 mg  2 mg Intravenous Q4H PRN Evelina Bucy, DPM   2 mg at 05/14/19 2242  . [START ON 05/16/2019] multivitamin (RENA-VIT) tablet 1 tablet  1 tablet Oral Q M,W,F-1800 Price, Christian Mate, DPM      . Muscle Rub CREA 1 application  1 application Topical PRN Amin, Ankit Chirag, MD      . ondansetron (ZOFRAN) tablet 4 mg  4 mg Oral Q6H PRN Evelina Bucy, DPM       Or  . ondansetron (ZOFRAN) injection 4 mg  4 mg Intravenous Q6H PRN Evelina Bucy, DPM      . phenol (CHLORASEPTIC) mouth spray 1 spray  1 spray Mouth/Throat PRN Amin, Ankit Chirag, MD      . polyethylene glycol (MIRALAX / GLYCOLAX) packet 17 g  17 g Oral Daily PRN Amin, Ankit Chirag, MD      . polyvinyl alcohol (LIQUIFILM TEARS) 1.4 % ophthalmic solution 1 drop  1 drop Both Eyes PRN Amin, Ankit Chirag, MD      . senna-docusate (Senokot-S) tablet 1 tablet  1 tablet Oral QHS PRN Hardie Pulley  J, DPM      . sevelamer carbonate (RENVELA) powder PACK 2.4 g  2.4 g Oral TID WC Stovall, Kathryn R, PA-C      . sodium chloride (OCEAN) 0.65 % nasal spray 1 spray  1 spray Each Nare PRN Damita Lack, MD       Labs: Basic Metabolic Panel: Recent Labs  Lab 05/13/19 1928 05/14/19 0943 05/15/19 0507  NA 137 134* 133*  K 4.2 4.3 4.3  CL 92*  --  90*  CO2 31  --  27  GLUCOSE 136* 117* 190*  BUN 17  --  39*  CREATININE 7.69*  --  11.88*  CALCIUM 9.2  --  8.9   Liver Function Tests: Recent Labs  Lab 05/13/19 1928 05/15/19 0507  AST 14* 11*  ALT 14 13  ALKPHOS 86 80  BILITOT 0.6 0.6  PROT 7.7 6.1*  ALBUMIN 3.1* 2.4*   No results for input(s): LIPASE, AMYLASE in the last 168 hours. No results for input(s): AMMONIA in the last 168 hours. CBC: Recent Labs  Lab 05/13/19 1928 05/14/19 0736 05/14/19 0943 05/15/19 0507  WBC 9.9 9.4  --  8.6  NEUTROABS 8.0*  --   --   --   HGB 8.6* 9.7* 9.9* 7.5*  HCT 28.7* 32.1* 29.0* 23.4*  MCV 92.9 91.2  --  87.3  PLT 354 367  --  303   Cardiac Enzymes: No results for input(s): CKTOTAL, CKMB, CKMBINDEX, TROPONINI in the last 168 hours. CBG: Recent Labs  Lab 05/14/19 1120 05/14/19 1238 05/14/19 1634 05/14/19 2127 05/15/19 0729  GLUCAP 126* 118* 200* 153* 162*   Iron Studies: No results for input(s): IRON, TIBC, TRANSFERRIN, FERRITIN in the last 72 hours. Studies/Results: Dg Foot 2 Views Right  Result Date: 05/14/2019 CLINICAL DATA:  S/p partial amputation of the 2nd toe. EXAM: RIGHT FOOT - 2 VIEW COMPARISON:  05/06/2011 FINDINGS: Amputation of the 2nd digit from the PIP noted. Irregularity of the great toe distal phalanx is unchanged. No other significant abnormalities identified. 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 Electronically Signed   By: Margarette Canada M.D.   On: 05/14/2019 12:23   Vas [place] Lower Extremity Venous (dvt) (only [facility])  Result Date: 05/14/2019  Lower Venous Study Indications: Pain, Swelling, and infection of foot.  Limitations: Edema, shadowing, and leg elevation. Comparison Study: No prior study on file for comparison. Performing Technologist: Sharion Dove RVS  Examination  Guidelines: A complete evaluation includes B-mode imaging, spectral Doppler, color Doppler, and power Doppler as needed of all accessible portions of each vessel. Bilateral testing is considered an integral part of a complete examination. Limited examinations for reoccurring indications may be performed as noted.  +---------+---------------+---------+-----------+----------+-------------------+ RIGHT    CompressibilityPhasicitySpontaneityPropertiesSummary             +---------+---------------+---------+-----------+----------+-------------------+ CFV      Full           Yes      Yes                                      +---------+---------------+---------+-----------+----------+-------------------+ SFJ      Full                                                             +---------+---------------+---------+-----------+----------+-------------------+  FV Prox  Full                                                             +---------+---------------+---------+-----------+----------+-------------------+ FV Mid   Full                                                             +---------+---------------+---------+-----------+----------+-------------------+ FV DistalFull                                                             +---------+---------------+---------+-----------+----------+-------------------+ PFV      Full                                                             +---------+---------------+---------+-----------+----------+-------------------+ POP      Full           Yes      Yes                                      +---------+---------------+---------+-----------+----------+-------------------+ PTV                                                   visualized by color +---------+---------------+---------+-----------+----------+-------------------+ PERO                                                  visualized by color  +---------+---------------+---------+-----------+----------+-------------------+   +----+---------------+---------+-----------+----------+-------+ LEFTCompressibilityPhasicitySpontaneityPropertiesSummary +----+---------------+---------+-----------+----------+-------+ CFV Full           Yes      Yes                          +----+---------------+---------+-----------+----------+-------+     Summary: Right: There is no evidence of deep vein thrombosis in the lower extremity. However, portions of this examination were limited- see technologist comments above. Left: No evidence of common femoral vein obstruction.  *See table(s) above for measurements and observations. Electronically signed by Deitra Mayo MD on 05/14/2019 at 9:54:30 AM.    Final     Dialysis Orders:  MWF at [facility] 4hr, 450/800, EDW 98kg, 2K/2.25Ca, UFP #3, AVF, heparin 2000 bolus - Hectoral 76mg IV q HD - Mircera 523m IV q 2 weeks (last 7/15)  Assessment/Plan: 1.  R 1st/2nd toe osteomyelitis: S/p partial 2nd toe amputation and great toe bone Bx 7/25 -  pending. On empiric Vanc/Ceftriaxone. He is certainly high risk for PAD - consider vascular eval while here. 2.  ESRD:  Continue HD per usual MWF schedule - next HD 7/27. Holding heparin. 3.  Hypertension/volume: BP reasonable, will continue home EDW for now. 4.  Anemia: Hgb 7.5 - nearly due for ESA, last dose was fairly low - re-dose a little early with next HD. Transfuse prn. 5.  Metabolic bone disease: Ca ok, Phos pending. Lorin Picket on med list - says giving him constipation, requesting change to Renvela pwdr which is fine - ordered.  6.  Nutrition: Alb low, will order pro-stat supplements. 7.  Type 2 DM: Insulin per primary. 8.  Hyperlipidemia: Consider statin in future.  Veneta Penton, PA-C 05/15/2019, 10:57 AM  [facility] Pager: [phone]  Pt seen, examined and agree w A/P as above.  Kelly Splinter  MD 05/15/2019, 2:55 PM

## 2019-05-15 NOTE — Progress Notes (Signed)
Pharmacy Antibiotic Note  KAHLEEL FADELEY is a 53 y.o. male admitted on 05/13/2019 with diabetic foot infection after L foot injury. Pharmacy has been consulted for vancomycin dosing. Pt has hx of ESRD on HD MWF, last HD unknown.  Plan: -Vancomycin 2000mg  IV x1 already ordered by EDP -Vancomycin 1000mg  IV qHD -scheduled for MWF -Ceftriaxone 1g q24   Height: 5\' 10"  (177.8 cm) Weight: 216 lb 0.8 oz (98 kg) IBW/kg (Calculated) : 73  Temp (24hrs), Avg:98.9 F (37.2 C), Min:98.4 F (36.9 C), Max:99.2 F (37.3 C)  Recent Labs  Lab 05/13/19 1928 05/13/19 1940 05/14/19 0736 05/15/19 0507  WBC 9.9  --  9.4 8.6  CREATININE 7.69*  --   --  11.88*  LATICACIDVEN  --  0.6  --   --     Estimated Creatinine Clearance: 8.4 mL/min (A) (by C-G formula based on SCr of 11.88 mg/dL (H)).    Allergies  Allergen Reactions  . Amoxicillin Itching    Did it involve swelling of the face/tongue/throat, SOB, or low BP? Unknown Did it involve sudden or severe rash/hives, skin peeling, or any reaction on the inside of your mouth or nose? Unknown Did you need to seek medical attention at a hospital or doctor's office? Unknown When did it last happen?20 years ago If all above answers are "NO", may proceed with cephalosporin use.   Marland Kitchen Hydromorphone Itching    Patient states he may take with benadryl. Makes feet itch.  Marland Kitchen Percocet [Oxycodone-Acetaminophen] Itching and Other (See Comments)    Legs only. Itching and burning feeling.    Antimicrobials this admission: Ceftriaxone 7/24 >> Vancomycin 7/25 >>   Dose adjustments this admission: none  Microbiology results: 7/25 R toe tissueCx: rare GNR 7/25 Bone 1st distal toe: no organism on gram stain 7/25 MRSA PCR: neg 7/25 BCx: ngtd  Prev Cx: 7/17 WoundCx: E coli, Group B Strep  Thank you for allowing pharmacy to be a part of this patient's care.  Minda Ditto PharmD Clinical Pharmacist Please check AMION for all [facility]  numbers 05/15/2019

## 2019-05-15 NOTE — Progress Notes (Signed)
Subjective:  Patient ID: [name], male    DOB: 06-Dec-1965,  MRN: [id number]  Had some gas and indigestion after surgery. Otherwise pain controlled, denies complaints. Objective:   Vitals:   05/15/19 0420 05/15/19 0737  BP: (!) 157/83 136/67  Pulse: (!) 102 95  Resp: 14 16  Temp: 98.4 F (36.9 C) 99.2 F (37.3 C)  SpO2: 94% 97%   General AA&O x3. Normal mood and affect.  Vascular Right foot warm and well perfused.  Neurologic Epicritic sensation grossly intact.  Dermatologic Surgical wound healing well. Skin edges viable. No warmth, erythema, signs of acute infection.  Orthopedic: MMT 5/5 in dorsiflexion, plantarflexion, inversion, and eversion. Normal joint ROM without pain or crepitus.   Results for orders placed or performed during the hospital encounter of 05/13/19  SARS Coronavirus 2 (CEPHEID - Performed in [facility] hospital lab), Hosp Order     Status: None   Collection Time: 05/14/19  2:32 AM   Specimen: Nasopharyngeal Swab  Result Value Ref Range Status   SARS Coronavirus 2 NEGATIVE NEGATIVE Final    Comment: (NOTE) If result is NEGATIVE SARS-CoV-2 target nucleic acids are NOT DETECTED. The SARS-CoV-2 RNA is generally detectable in upper and lower  respiratory specimens during the acute phase of infection. The lowest  concentration of SARS-CoV-2 viral copies this assay can detect is 250  copies / mL. A negative result does not preclude SARS-CoV-2 infection  and should not be used as the sole basis for treatment or other  patient management decisions.  A negative result may occur with  improper specimen collection / handling, submission of specimen other  than nasopharyngeal swab, presence of viral mutation(s) within the  areas targeted by this assay, and inadequate number of viral copies  (<250 copies / mL). A negative result must be combined with clinical  observations, patient history, and epidemiological information. If result is POSITIVE SARS-CoV-2  target nucleic acids are DETECTED. The SARS-CoV-2 RNA is generally detectable in upper and lower  respiratory specimens dur ing the acute phase of infection.  Positive  results are indicative of active infection with SARS-CoV-2.  Clinical  correlation with patient history and other diagnostic information is  necessary to determine patient infection status.  Positive results do  not rule out bacterial infection or co-infection with other viruses. If result is PRESUMPTIVE POSTIVE SARS-CoV-2 nucleic acids MAY BE PRESENT.   A presumptive positive result was obtained on the submitted specimen  and confirmed on repeat testing.  While 2019 novel coronavirus  (SARS-CoV-2) nucleic acids may be present in the submitted sample  additional confirmatory testing may be necessary for epidemiological  and / or clinical management purposes  to differentiate between  SARS-CoV-2 and other Sarbecovirus currently known to infect humans.  If clinically indicated additional testing with an alternate test  methodology [phone]) is advised. The SARS-CoV-2 RNA is generally  detectable in upper and lower respiratory sp ecimens during the acute  phase of infection. The expected result is Negative. Fact Sheet for Patients:  StrictlyIdeas.no Fact Sheet for Healthcare Providers: BankingDealers.co.za This test is not yet approved or cleared by the [place] FDA and has been authorized for detection and/or diagnosis of SARS-CoV-2 by FDA under an Emergency Use Authorization (EUA).  This EUA will remain in effect (meaning this test can be used) for the duration of the COVID-19 declaration under Section 564(b)(1) of the Act, 21 U.S.C. section 360bbb-3(b)(1), unless the authorization is terminated or revoked sooner. Performed at [facility]  [facility], [address] [place], [place] [address]   Blood culture (routine x 2)     Status: None (Preliminary result)    Collection Time: 05/14/19  2:48 AM   Specimen: BLOOD  Result Value Ref Range Status   Specimen Description BLOOD LEFT ARM  Final   Special Requests   Final    BOTTLES DRAWN AEROBIC AND ANAEROBIC Blood Culture results may not be optimal due to an inadequate volume of blood received in culture bottles   Culture   Final    NO GROWTH 1 DAY Performed at [facility], [place] [address] [place], [place] [address]    Report Status PENDING  Incomplete  Blood culture (routine x 2)     Status: None (Preliminary result)   Collection Time: 05/14/19  2:55 AM   Specimen: BLOOD LEFT HAND  Result Value Ref Range Status   Specimen Description BLOOD LEFT HAND  Final   Special Requests   Final    BOTTLES DRAWN AEROBIC AND ANAEROBIC Blood Culture adequate volume   Culture   Final    NO GROWTH 1 DAY Performed at [facility], [place] [address], [place], [place] [address]    Report Status PENDING  Incomplete  MRSA PCR Screening     Status: None   Collection Time: 05/14/19  5:29 AM   Specimen: Nasal Mucosa; Nasopharyngeal  Result Value Ref Range Status   MRSA by PCR NEGATIVE NEGATIVE Final    Comment:        The GeneXpert MRSA Assay (FDA approved for NASAL specimens only), is one component of a comprehensive MRSA colonization surveillance program. It is not intended to diagnose MRSA infection nor to guide or monitor treatment for MRSA infections. Performed at [facility], [place] [address] [place], [place] [address]   Aerobic/Anaerobic Culture (surgical/deep wound)     Status: None (Preliminary result)   Collection Time: 05/14/19 11:20 AM   Specimen: Bone; Tissue  Result Value Ref Range Status   Specimen Description BONE 1ST DISTAL PHALANX  Final   Special Requests NONE  Final   Gram Stain   Final    RARE WBC PRESENT, PREDOMINANTLY PMN NO ORGANISMS SEEN Performed at [facility], [place] [address] [place], [place] [address]    Culture PENDING  Incomplete   Report Status  PENDING  Incomplete  Aerobic/Anaerobic Culture (surgical/deep wound)     Status: None (Preliminary result)   Collection Time: 05/14/19 11:21 AM   Specimen: Soft Tissue, Other  Result Value Ref Range Status   Specimen Description TISSUE RIGHT TOE 2ND  Final   Special Requests NONE  Final   Gram Stain   Final    RARE WBC PRESENT, PREDOMINANTLY PMN NO ORGANISMS SEEN Performed at [facility], [place] [address], [place], [place] [address]    Culture PENDING  Incomplete   Report Status PENDING  Incomplete    Results for orders placed or performed during the hospital encounter of 05/13/19 (from the past 24 hour(s))  Aerobic/Anaerobic Culture (surgical/deep wound)     Status: None (Preliminary result)   Collection Time: 05/14/19 11:20 AM   Specimen: Bone; Tissue  Result Value Ref Range   Specimen Description BONE 1ST DISTAL PHALANX    Special Requests NONE    Gram Stain      RARE WBC PRESENT, PREDOMINANTLY PMN NO ORGANISMS SEEN Performed at [place] [address], [place], [place] [address]  Culture PENDING    Report Status PENDING   Glucose, capillary     Status: Abnormal   Collection Time: 05/14/19 11:20 AM  Result Value Ref Range   Glucose-Capillary 126 (H) 70 - 99 mg/dL  Aerobic/Anaerobic Culture (surgical/deep wound)     Status: None (Preliminary result)   Collection Time: 05/14/19 11:21 AM   Specimen: Soft Tissue, Other  Result Value Ref Range   Specimen Description TISSUE RIGHT TOE 2ND    Special Requests NONE    Gram Stain      RARE WBC PRESENT, PREDOMINANTLY PMN NO ORGANISMS SEEN Performed at [facility], [place] [address], [place], [place] [address]    Culture PENDING    Report Status PENDING   Glucose, capillary     Status: Abnormal   Collection Time: 05/14/19 12:38 PM  Result Value Ref Range   Glucose-Capillary 118 (H) 70 - 99 mg/dL  Glucose, capillary     Status: Abnormal   Collection Time: 05/14/19  4:34 PM  Result Value Ref Range    Glucose-Capillary 200 (H) 70 - 99 mg/dL  Glucose, capillary     Status: Abnormal   Collection Time: 05/14/19  9:27 PM  Result Value Ref Range   Glucose-Capillary 153 (H) 70 - 99 mg/dL  CBC     Status: Abnormal   Collection Time: 05/15/19  5:07 AM  Result Value Ref Range   WBC 8.6 4.0 - 10.5 K/uL   RBC 2.68 (L) 4.22 - 5.81 MIL/uL   Hemoglobin 7.5 (L) 13.0 - 17.0 g/dL   HCT 23.4 (L) 39.0 - 52.0 %   MCV 87.3 80.0 - 100.0 fL   MCH 28.0 26.0 - 34.0 pg   MCHC 32.1 30.0 - 36.0 g/dL   RDW 16.5 (H) 11.5 - 15.5 %   Platelets 303 150 - 400 K/uL   nRBC 0.0 0.0 - 0.2 %  Magnesium     Status: Abnormal   Collection Time: 05/15/19  5:07 AM  Result Value Ref Range   Magnesium 2.6 (H) 1.7 - 2.4 mg/dL  Comprehensive metabolic panel     Status: Abnormal   Collection Time: 05/15/19  5:07 AM  Result Value Ref Range   Sodium 133 (L) 135 - 145 mmol/L   Potassium 4.3 3.5 - 5.1 mmol/L   Chloride 90 (L) 98 - 111 mmol/L   CO2 27 22 - 32 mmol/L   Glucose, Bld 190 (H) 70 - 99 mg/dL   BUN 39 (H) 6 - 20 mg/dL   Creatinine, Ser 11.88 (H) 0.61 - 1.24 mg/dL   Calcium 8.9 8.9 - 10.3 mg/dL   Total Protein 6.1 (L) 6.5 - 8.1 g/dL   Albumin 2.4 (L) 3.5 - 5.0 g/dL   AST 11 (L) 15 - 41 U/L   ALT 13 0 - 44 U/L   Alkaline Phosphatase 80 38 - 126 U/L   Total Bilirubin 0.6 0.3 - 1.2 mg/dL   GFR calc non Af Amer 4 (L) >60 mL/min   GFR calc Af Amer 5 (L) >60 mL/min   Anion gap 16 (H) 5 - 15  Glucose, capillary     Status: Abnormal   Collection Time: 05/15/19  7:29 AM  Result Value Ref Range   Glucose-Capillary 162 (H) 70 - 99 mg/dL  Hemoglobin A1c     Status: Abnormal   Collection Time: 05/15/19  9:31 AM  Result Value Ref Range   Hgb A1c MFr Bld 8.0 (H) 4.8 - 5.6 %  Mean Plasma Glucose 182.9 mg/dL  Lipid panel     Status: Abnormal   Collection Time: 05/15/19  9:31 AM  Result Value Ref Range   Cholesterol 231 (H) 0 - 200 mg/dL   Triglycerides 236 (H) <150 mg/dL   HDL 43 >40 mg/dL   Total CHOL/HDL Ratio 5.4  RATIO   VLDL 47 (H) 0 - 40 mg/dL   LDL Cholesterol 141 (H) 0 - 99 mg/dL    Assessment & Plan:  Patient was evaluated and treated and all questions answered.  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 NGTD -Continue Empiric Abx. Await cultures. 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 Will likely need extended IV abx with HD for distal phalanx OM depending upon culture. -Leave dressing intact to be changed only by surgeon at this time. -Continue HHC at D/c. Betadine WTD dressing qOD. -Pending ABI/TBI -Will continue to follow. -WBAT in boot.  Evelina Bucy, DPM  Accessible via secure chat for questions or concerns.

## 2019-05-15 NOTE — Progress Notes (Signed)
VASCULAR LAB PRELIMINARY  PRELIMINARY  PRELIMINARY  PRELIMINARY  ABIs completed.    Preliminary report:  See CV proc for preliminary results.   Burdette Forehand, RVT 05/15/2019, 4:47 PM

## 2019-05-15 NOTE — Progress Notes (Signed)
PROGRESS NOTE    [name]  HKV:[id number] DOB: 10-Sep-1966 DOA: 05/13/2019 PCP: Hayden Rasmussen, MD   Brief Narrative:  53 year old with essential hypertension, hyperlipidemia, diabetes mellitus type 2, ESRD on Monday Wednesday Friday hemodialysis, iron deficiency anemia came to the hospital with complains of right lower foot pain diagnosed with osteomyelitis.  Underwent right great toe bone biopsy and amputation of interphalangeal joint on 7/25.  Right lower extremity Dopplers was negative for DVT.  IV antibiotics were continued.   Assessment & Plan:   Principal Problem:   Osteomyelitis of great toe of right foot (HCC) Active Problems:   Essential hypertension   ESRD on dialysis ([place])   Diabetes mellitus with renal manifestation (HCC)   Diabetic foot ulcer- right second toe   Sepsis ([place])   Right foot osteomyelitis status post partial second toe amputation with bone biopsy -Culture data currently is pending. -Meantime continue IV antibiotics IV vancomycin and Rocephin. -Supportive care, pain control. - Suspecting peripheral vascular disease therefore will order ABI/TBI -Check hemoglobin A1c and lipid panel -Local dressing per podiatry. -Weightbearing as tolerated in the boot. -Right lower extremity Dopplers-negative  ESRD on hemodialysis Monday Wednesday Friday -Nephrology team following. - Continue iron asp during dialysis, Renvela and multivitamin.  Essential hypertension -Does not appear to be any home medications.  Anemia of chronic disease - Check his iron studies, B12, folate, TSH  Diabetes mellitus type 2 -Insulin sliding scale Accu-Chek. -Check hemoglobin A1c  Hyperlipidemia -Statin  DVT prophylaxis: Subcutaneous heparin Code Status: Full code Family Communication: None at bedside Disposition Plan: Patient has osteomyelitis requiring IV antibiotics, he is inpatient appropriate.  Needing further investigation for peripheral vascular  disease.  Consultants:   Podiatry  Procedures:   Right second toe amputation 7/25  Antimicrobials:   Vancomycin  Rocephin   Subjective: Reports of right lower extremity claudication prior to surgery.  No other complaints besides the pain at the surgical site.  Review of Systems Otherwise negative except as per HPI, including: General: Denies fever, chills, night sweats or unintended weight loss. Resp: Denies cough, wheezing, shortness of breath. Cardiac: Denies chest pain, palpitations, orthopnea, paroxysmal nocturnal dyspnea. GI: Denies abdominal pain, nausea, vomiting, diarrhea or constipation GU: Denies dysuria, frequency, hesitancy or incontinence MS: Denies muscle aches, joint pain or swelling Neuro: Denies headache, neurologic deficits (focal weakness, numbness, tingling), abnormal gait Psych: Denies anxiety, depression, SI/HI/AVH Skin: Denies new rashes or lesions ID: Denies sick contacts, exotic exposures, travel  Objective: Vitals:   05/14/19 1924 05/15/19 0013 05/15/19 0420 05/15/19 0737  BP: 134/71 (!) 148/78 (!) 157/83 136/67  Pulse: (!) 101 (!) 102 (!) 102 95  Resp: 14 16 14 16   Temp: 98.8 F (37.1 C) 99.1 F (37.3 C) 98.4 F (36.9 C) 99.2 F (37.3 C)  TempSrc: Oral Oral Oral Oral  SpO2: 100% 97% 94% 97%  Weight:      Height:        Intake/Output Summary (Last 24 hours) at 05/15/2019 1319 Last data filed at 05/15/2019 1300 Gross per 24 hour  Intake 1200 ml  Output 400 ml  Net 800 ml   Filed Weights   05/13/19 1905  Weight: 98 kg    Examination:  General exam: Appears calm and comfortable  Respiratory system: Clear to auscultation. Respiratory effort normal. Cardiovascular system: S1 & S2 heard, RRR. No JVD, murmurs, rubs, gallops or clicks. No pedal edema. Gastrointestinal system: Abdomen is nondistended, soft and nontender. No organomegaly or masses felt. Normal bowel sounds heard. Central  nervous system: Alert and oriented. No focal  neurological deficits. Extremities: Symmetric 5 x 5 power. Skin: Right lower extremity dressing noted with bandages in place without any evidence of obvious bleeding at this time. Psychiatry: Judgement and insight appear normal. Mood & affect appropriate.     Data Reviewed:   CBC: Recent Labs  Lab 05/13/19 1928 05/14/19 0736 05/14/19 0943 05/15/19 0507  WBC 9.9 9.4  --  8.6  NEUTROABS 8.0*  --   --   --   HGB 8.6* 9.7* 9.9* 7.5*  HCT 28.7* 32.1* 29.0* 23.4*  MCV 92.9 91.2  --  87.3  PLT 354 367  --  295   Basic Metabolic Panel: Recent Labs  Lab 05/13/19 1928 05/14/19 0943 05/15/19 0507  NA 137 134* 133*  K 4.2 4.3 4.3  CL 92*  --  90*  CO2 31  --  27  GLUCOSE 136* 117* 190*  BUN 17  --  39*  CREATININE 7.69*  --  11.88*  CALCIUM 9.2  --  8.9  MG  --   --  2.6*   GFR: Estimated Creatinine Clearance: 8.4 mL/min (A) (by C-G formula based on SCr of 11.88 mg/dL (H)). Liver Function Tests: Recent Labs  Lab 05/13/19 1928 05/15/19 0507  AST 14* 11*  ALT 14 13  ALKPHOS 86 80  BILITOT 0.6 0.6  PROT 7.7 6.1*  ALBUMIN 3.1* 2.4*   No results for input(s): LIPASE, AMYLASE in the last 168 hours. No results for input(s): AMMONIA in the last 168 hours. Coagulation Profile: No results for input(s): INR, PROTIME in the last 168 hours. Cardiac Enzymes: No results for input(s): CKTOTAL, CKMB, CKMBINDEX, TROPONINI in the last 168 hours. BNP (last 3 results) No results for input(s): PROBNP in the last 8760 hours. HbA1C: Recent Labs    05/15/19 0931  HGBA1C 8.0*   CBG: Recent Labs  Lab 05/14/19 1238 05/14/19 1634 05/14/19 2127 05/15/19 0729 05/15/19 1121  GLUCAP 118* 200* 153* 162* 108*   Lipid Profile: Recent Labs    05/15/19 0931  CHOL 231*  HDL 43  LDLCALC 141*  TRIG 236*  CHOLHDL 5.4   Thyroid Function Tests: No results for input(s): TSH, T4TOTAL, FREET4, T3FREE, THYROIDAB in the last 72 hours. Anemia Panel: No results for input(s): VITAMINB12,  FOLATE, FERRITIN, TIBC, IRON, RETICCTPCT in the last 72 hours. Sepsis Labs: Recent Labs  Lab 05/13/19 1940  LATICACIDVEN 0.6    Recent Results (from the past 240 hour(s))  WOUND CULTURE     Status: Abnormal   Collection Time: 05/06/19  5:16 PM  Result Value Ref Range Status   MICRO NUMBER: [id number]  Final   SPECIMEN QUALITY: Adequate  Final   SOURCE: NOT GIVEN  Final   STATUS: FINAL  Final   GRAM STAIN: Gram positive cocci in chains  Final    Comment: No white blood cells seen No epithelial cells seen Many Gram negative bacilli Few Gram positive cocci in chains   ISOLATE 1: Escherichia coli (A)  Final    Comment: Heavy growth of Escherichia coli   ISOLATE 2: Streptococcus agalactiae (A)  Final    Comment: Moderate growth of Group B Streptococcus isolated Beta-hemolytic Streptococci are predictably susceptible to penicillin and other beta-lactams. Susceptibility testing not routinely performed.      Susceptibility   Escherichia coli - AEROBIC CULT, GRAM STAIN NEGATIVE 1    AMOX/CLAVULANIC 4 Sensitive     AMPICILLIN 4 Sensitive     AMPICILLIN/SULBACTAM 4 Sensitive  CEFAZOLIN* <=4 Not Reportable      * For infections other than uncomplicated UTIcaused by E. coli, K. pneumoniae or P. mirabilis:Cefazolin is resistant if MIC > or = 8 mcg/mL.(Distinguishing susceptible versus intermediatefor isolates with MIC < or = 4 mcg/mL requiresadditional testing.)    CEFEPIME <=1 Sensitive     CEFTRIAXONE <=1 Sensitive     CIPROFLOXACIN <=0.25 Sensitive     LEVOFLOXACIN <=0.12 Sensitive     ERTAPENEM <=0.5 Sensitive     GENTAMICIN <=1 Sensitive     IMIPENEM <=0.25 Sensitive     PIP/TAZO <=4 Sensitive     TOBRAMYCIN <=1 Sensitive     TRIMETH/SULFA* <=20 Sensitive      * For infections other than uncomplicated UTIcaused by E. coli, K. pneumoniae or P. mirabilis:Cefazolin is resistant if MIC > or = 8 mcg/mL.(Distinguishing susceptible versus intermediatefor isolates with MIC < or = 4 mcg/mL  requiresadditional testing.)Legend:S = Susceptible  I = IntermediateR = Resistant  NS = Not susceptible* = Not tested  NR = Not reported**NN = See antimicrobic comments  SARS Coronavirus 2 (Performed in [facility] lab)     Status: None   Collection Time: 05/13/19  1:00 PM   Specimen: Nasal Swab  Result Value Ref Range Status   SARS Coronavirus 2 NEGATIVE NEGATIVE Final    Comment: (NOTE) SARS-CoV-2 target nucleic acids are NOT DETECTED. The SARS-CoV-2 RNA is generally detectable in upper and lower respiratory specimens during the acute phase of infection. Negative results do not preclude SARS-CoV-2 infection, do not rule out co-infections with other pathogens, and should not be used as the sole basis for treatment or other patient management decisions. Negative results must be combined with clinical observations, patient history, and epidemiological information. The expected result is Negative. Fact Sheet for Patients: SugarRoll.be Fact Sheet for Healthcare Providers: https://www.woods-mathews.com/ This test is not yet approved or cleared by the [place] FDA and  has been authorized for detection and/or diagnosis of SARS-CoV-2 by FDA under an Emergency Use Authorization (EUA). This EUA will remain  in effect (meaning this test can be used) for the duration of the COVID-19 declaration under Section 56 4(b)(1) of the Act, 21 U.S.C. section 360bbb-3(b)(1), unless the authorization is terminated or revoked sooner. Performed at [facility], [place] [address] [place], [place] [address]   SARS Coronavirus 2 (CEPHEID - Performed in [facility] lab), Hosp Order     Status: None   Collection Time: 05/14/19  2:32 AM   Specimen: Nasopharyngeal Swab  Result Value Ref Range Status   SARS Coronavirus 2 NEGATIVE NEGATIVE Final    Comment: (NOTE) If result is NEGATIVE SARS-CoV-2 target nucleic acids are NOT DETECTED. The  SARS-CoV-2 RNA is generally detectable in upper and lower  respiratory specimens during the acute phase of infection. The lowest  concentration of SARS-CoV-2 viral copies this assay can detect is 250  copies / mL. A negative result does not preclude SARS-CoV-2 infection  and should not be used as the sole basis for treatment or other  patient management decisions.  A negative result may occur with  improper specimen collection / handling, submission of specimen other  than nasopharyngeal swab, presence of viral mutation(s) within the  areas targeted by this assay, and inadequate number of viral copies  (<250 copies / mL). A negative result must be combined with clinical  observations, patient history, and epidemiological information. If result is POSITIVE SARS-CoV-2 target nucleic acids are DETECTED. The SARS-CoV-2 RNA  is generally detectable in upper and lower  respiratory specimens dur ing the acute phase of infection.  Positive  results are indicative of active infection with SARS-CoV-2.  Clinical  correlation with patient history and other diagnostic information is  necessary to determine patient infection status.  Positive results do  not rule out bacterial infection or co-infection with other viruses. If result is PRESUMPTIVE POSTIVE SARS-CoV-2 nucleic acids MAY BE PRESENT.   A presumptive positive result was obtained on the submitted specimen  and confirmed on repeat testing.  While 2019 novel coronavirus  (SARS-CoV-2) nucleic acids may be present in the submitted sample  additional confirmatory testing may be necessary for epidemiological  and / or clinical management purposes  to differentiate between  SARS-CoV-2 and other Sarbecovirus currently known to infect humans.  If clinically indicated additional testing with an alternate test  methodology [phone]) is advised. The SARS-CoV-2 RNA is generally  detectable in upper and lower respiratory sp ecimens during the acute    phase of infection. The expected result is Negative. Fact Sheet for Patients:  StrictlyIdeas.no Fact Sheet for Healthcare Providers: BankingDealers.co.za This test is not yet approved or cleared by the [place] FDA and has been authorized for detection and/or diagnosis of SARS-CoV-2 by FDA under an Emergency Use Authorization (EUA).  This EUA will remain in effect (meaning this test can be used) for the duration of the COVID-19 declaration under Section 564(b)(1) of the Act, 21 U.S.C. section 360bbb-3(b)(1), unless the authorization is terminated or revoked sooner. Performed at [facility], [place] [address] [place], [place] [address]   Blood culture (routine x 2)     Status: None (Preliminary result)   Collection Time: 05/14/19  2:48 AM   Specimen: BLOOD  Result Value Ref Range Status   Specimen Description BLOOD LEFT ARM  Final   Special Requests   Final    BOTTLES DRAWN AEROBIC AND ANAEROBIC Blood Culture results may not be optimal due to an inadequate volume of blood received in culture bottles   Culture   Final    NO GROWTH 1 DAY Performed at [facility], [place] [address], [place], [place] [address]    Report Status PENDING  Incomplete  Blood culture (routine x 2)     Status: None (Preliminary result)   Collection Time: 05/14/19  2:55 AM   Specimen: BLOOD LEFT HAND  Result Value Ref Range Status   Specimen Description BLOOD LEFT HAND  Final   Special Requests   Final    BOTTLES DRAWN AEROBIC AND ANAEROBIC Blood Culture adequate volume   Culture   Final    NO GROWTH 1 DAY Performed at [facility], [place] [address] [place], [place] [address]    Report Status PENDING  Incomplete  MRSA PCR Screening     Status: None   Collection Time: 05/14/19  5:29 AM   Specimen: Nasal Mucosa; Nasopharyngeal  Result Value Ref Range Status   MRSA by PCR NEGATIVE NEGATIVE Final    Comment:        The GeneXpert MRSA Assay  (FDA approved for NASAL specimens only), is one component of a comprehensive MRSA colonization surveillance program. It is not intended to diagnose MRSA infection nor to guide or monitor treatment for MRSA infections. Performed at [facility], [place] [address], [place], [place] [address]   Aerobic/Anaerobic Culture (surgical/deep wound)     Status: None (Preliminary result)   Collection Time: 05/14/19 11:20 AM  Specimen: Bone; Tissue  Result Value Ref Range Status   Specimen Description BONE 1ST DISTAL PHALANX  Final   Special Requests NONE  Final   Gram Stain   Final    RARE WBC PRESENT, PREDOMINANTLY PMN NO ORGANISMS SEEN    Culture   Final    NO GROWTH 1 DAY Performed at [facility], [address], [place], [place] [address]    Report Status PENDING  Incomplete  Aerobic/Anaerobic Culture (surgical/deep wound)     Status: None (Preliminary result)   Collection Time: 05/14/19 11:21 AM   Specimen: Soft Tissue, Other  Result Value Ref Range Status   Specimen Description TISSUE RIGHT TOE 2ND  Final   Special Requests NONE  Final   Gram Stain   Final    RARE WBC PRESENT, PREDOMINANTLY PMN RARE GRAM NEGATIVE RODS Performed at [facility], [place] [address], [place], [place] [address]    Culture RARE GRAM NEGATIVE RODS  Final   Report Status PENDING  Incomplete         Radiology Studies: Dg Foot 2 Views Right  Result Date: 05/14/2019 CLINICAL DATA:  S/p partial amputation of the 2nd toe. EXAM: RIGHT FOOT - 2 VIEW COMPARISON:  05/06/2011 FINDINGS: Amputation of the 2nd digit from the PIP noted. Irregularity of the great toe distal phalanx is unchanged. No other significant abnormalities identified. 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 Electronically Signed   By: Margarette Canada M.D.   On: 05/14/2019 12:23   Vas [place] Lower Extremity Venous (dvt) (only [facility])  Result Date: 05/14/2019  Lower Venous Study Indications: Pain, Swelling, and infection  of foot.  Limitations: Edema, shadowing, and leg elevation. Comparison Study: No prior study on file for comparison. Performing Technologist: Sharion Dove RVS  Examination Guidelines: A complete evaluation includes B-mode imaging, spectral Doppler, color Doppler, and power Doppler as needed of all accessible portions of each vessel. Bilateral testing is considered an integral part of a complete examination. Limited examinations for reoccurring indications may be performed as noted.  +---------+---------------+---------+-----------+----------+-------------------+  RIGHT     Compressibility Phasicity Spontaneity Properties Summary              +---------+---------------+---------+-----------+----------+-------------------+  CFV       Full            Yes       Yes                                         +---------+---------------+---------+-----------+----------+-------------------+  SFJ       Full                                                                  +---------+---------------+---------+-----------+----------+-------------------+  FV Prox   Full                                                                  +---------+---------------+---------+-----------+----------+-------------------+  FV  Mid    Full                                                                  +---------+---------------+---------+-----------+----------+-------------------+  FV Distal Full                                                                  +---------+---------------+---------+-----------+----------+-------------------+  PFV       Full                                                                  +---------+---------------+---------+-----------+----------+-------------------+  POP       Full            Yes       Yes                                         +---------+---------------+---------+-----------+----------+-------------------+  PTV                                                        visualized by color   +---------+---------------+---------+-----------+----------+-------------------+  PERO                                                       visualized by color  +---------+---------------+---------+-----------+----------+-------------------+   +----+---------------+---------+-----------+----------+-------+  LEFT Compressibility Phasicity Spontaneity Properties Summary  +----+---------------+---------+-----------+----------+-------+  CFV  Full            Yes       Yes                             +----+---------------+---------+-----------+----------+-------+     Summary: Right: There is no evidence of deep vein thrombosis in the lower extremity. However, portions of this examination were limited- see technologist comments above. Left: No evidence of common femoral vein obstruction.  *See table(s) above for measurements and observations. Electronically signed by Deitra Mayo MD on 05/14/2019 at 9:54:30 AM.    Final         Scheduled Meds:  Chlorhexidine Gluconate Cloth  6 each Topical Q0600   [START ON 05/16/2019] darbepoetin (ARANESP) injection - DIALYSIS  100 mcg Intravenous Q Mon-HD   [START ON 05/16/2019] doxercalciferol  6 mcg Intravenous Q M,W,F-HD   feeding supplement (PRO-STAT SUGAR FREE 64)  30 mL Oral BID   insulin aspart  0-5 Units  Subcutaneous QHS   insulin aspart  0-9 Units Subcutaneous TID WC   [START ON 05/16/2019] multivitamin  1 tablet Oral Q M,W,F-1800   sevelamer carbonate  2.4 g Oral TID WC   Continuous Infusions:  cefTRIAXone (ROCEPHIN)  IV 1 g (05/14/19 2246)     LOS: 0 days   Time spent= 35 mins    Danyela Posas Arsenio Loader, MD [facility]  If 7PM-7AM, please contact night-coverage [email or web address] 05/15/2019, 1:19 PM

## 2019-05-16 ENCOUNTER — Encounter (HOSPITAL_COMMUNITY): Payer: Self-pay | Admitting: Podiatry

## 2019-05-16 ENCOUNTER — Inpatient Hospital Stay (HOSPITAL_COMMUNITY): Payer: Medicare Other

## 2019-05-16 DIAGNOSIS — M86071 Acute hematogenous osteomyelitis, right ankle and foot: Secondary | ICD-10-CM

## 2019-05-16 DIAGNOSIS — I10 Essential (primary) hypertension: Secondary | ICD-10-CM

## 2019-05-16 DIAGNOSIS — Z992 Dependence on renal dialysis: Secondary | ICD-10-CM

## 2019-05-16 DIAGNOSIS — L0390 Cellulitis, unspecified: Secondary | ICD-10-CM

## 2019-05-16 DIAGNOSIS — N186 End stage renal disease: Secondary | ICD-10-CM

## 2019-05-16 DIAGNOSIS — I96 Gangrene, not elsewhere classified: Secondary | ICD-10-CM

## 2019-05-16 LAB — COMPREHENSIVE METABOLIC PANEL
ALT: 13 U/L (ref 0–44)
ALT: 18 U/L (ref 0–44)
AST: 11 U/L — ABNORMAL LOW (ref 15–41)
AST: 21 U/L (ref 15–41)
Albumin: 2.4 g/dL — ABNORMAL LOW (ref 3.5–5.0)
Albumin: 2.7 g/dL — ABNORMAL LOW (ref 3.5–5.0)
Alkaline Phosphatase: 80 U/L (ref 38–126)
Alkaline Phosphatase: 132 U/L — ABNORMAL HIGH (ref 38–126)
Anion gap: 16 — ABNORMAL HIGH (ref 5–15)
Anion gap: 18 — ABNORMAL HIGH (ref 5–15)
BUN: 39 mg/dL — ABNORMAL HIGH (ref 6–20)
BUN: 60 mg/dL — ABNORMAL HIGH (ref 6–20)
CO2: 27 mmol/L (ref 22–32)
CO2: 27 mmol/L (ref 22–32)
Calcium: 8.9 mg/dL (ref 8.9–10.3)
Calcium: 8.9 mg/dL (ref 8.9–10.3)
Chloride: 90 mmol/L — ABNORMAL LOW (ref 98–111)
Chloride: 89 mmol/L — ABNORMAL LOW (ref 98–111)
Creatinine, Ser: 11.88 mg/dL — ABNORMAL HIGH (ref 0.61–1.24)
Creatinine, Ser: 15.37 mg/dL — ABNORMAL HIGH (ref 0.61–1.24)
GFR calc Af Amer: 5 mL/min — ABNORMAL LOW (ref >60)
GFR calc Af Amer: 4 mL/min — ABNORMAL LOW (ref >60)
GFR calc non Af Amer: 4 mL/min — ABNORMAL LOW (ref >60)
GFR calc non Af Amer: 3 mL/min — ABNORMAL LOW (ref >60)
Glucose, Bld: 190 mg/dL — ABNORMAL HIGH (ref 70–99)
Glucose, Bld: 108 mg/dL — ABNORMAL HIGH (ref 70–99)
Potassium: 4.3 mmol/L (ref 3.5–5.1)
Potassium: 5.2 mmol/L — ABNORMAL HIGH (ref 3.5–5.1)
Sodium: 133 mmol/L — ABNORMAL LOW (ref 135–145)
Sodium: 134 mmol/L — ABNORMAL LOW (ref 135–145)
Total Bilirubin: 0.6 mg/dL (ref 0.3–1.2)
Total Bilirubin: 0.8 mg/dL (ref 0.3–1.2)
Total Protein: 6.1 g/dL — ABNORMAL LOW (ref 6.5–8.1)
Total Protein: 6 g/dL — ABNORMAL LOW (ref 6.5–8.1)

## 2019-05-16 LAB — GLUCOSE, CAPILLARY
Glucose-Capillary: 107 mg/dL — ABNORMAL HIGH (ref 70–99)
Glucose-Capillary: 91 mg/dL (ref 70–99)
Glucose-Capillary: 108 mg/dL — ABNORMAL HIGH (ref 70–99)
Glucose-Capillary: 106 mg/dL — ABNORMAL HIGH (ref 70–99)

## 2019-05-16 LAB — PHOSPHORUS: Phosphorus: 7.5 mg/dL — ABNORMAL HIGH (ref 2.5–4.6)

## 2019-05-16 LAB — CBC
HCT: 25.3 % — ABNORMAL LOW (ref 39.0–52.0)
Hemoglobin: 8.1 g/dL — ABNORMAL LOW (ref 13.0–17.0)
MCH: 27.7 pg (ref 26.0–34.0)
MCHC: 32 g/dL (ref 30.0–36.0)
MCV: 86.6 fL (ref 80.0–100.0)
Platelets: 389 10*3/uL (ref 150–400)
RBC: 2.92 MIL/uL — ABNORMAL LOW (ref 4.22–5.81)
RDW: 16.5 % — ABNORMAL HIGH (ref 11.5–15.5)
WBC: 9.7 10*3/uL (ref 4.0–10.5)
nRBC: 0 % (ref 0.0–0.2)

## 2019-05-16 LAB — IRON AND TIBC
Iron: 18 ug/dL — ABNORMAL LOW (ref 45–182)
Saturation Ratios: 17 % — ABNORMAL LOW (ref 17.9–39.5)
TIBC: 104 ug/dL — ABNORMAL LOW (ref 250–450)
UIBC: 86 ug/dL

## 2019-05-16 LAB — MAGNESIUM: Magnesium: 2.7 mg/dL — ABNORMAL HIGH (ref 1.7–2.4)

## 2019-05-16 LAB — FERRITIN: Ferritin: 1270 ng/mL — ABNORMAL HIGH (ref 24–336)

## 2019-05-16 LAB — TSH: TSH: 1.013 u[IU]/mL (ref 0.350–4.500)

## 2019-05-16 LAB — VITAMIN B12: Vitamin B-12: 1094 pg/mL — ABNORMAL HIGH (ref 180–914)

## 2019-05-16 MED ORDER — ATORVASTATIN CALCIUM 40 MG PO TABS
40.0000 mg | ORAL_TABLET | Freq: Every day | ORAL | Status: DC
  Filled 2019-05-16 (×2): qty 1

## 2019-05-16 MED ORDER — DARBEPOETIN ALFA 100 MCG/0.5ML IJ SOSY: 100.0000 ug | PREFILLED_SYRINGE | INTRAMUSCULAR | Status: DC

## 2019-05-16 MED ORDER — LIDOCAINE-PRILOCAINE 2.5-2.5 % EX CREA
1.0000 "application " | TOPICAL_CREAM | CUTANEOUS | Status: DC | PRN
  Filled 2019-05-16: qty 5

## 2019-05-16 MED ORDER — SODIUM CHLORIDE 0.9 % IV SOLN: 100.0000 mL | INTRAVENOUS | Status: DC | PRN

## 2019-05-16 MED ORDER — LIDOCAINE HCL (PF) 1 % IJ SOLN: 5.0000 mL | INTRAMUSCULAR | Status: DC | PRN

## 2019-05-16 MED ORDER — PENTAFLUOROPROP-TETRAFLUOROETH EX AERO: 1.0000 "application " | INHALATION_SPRAY | CUTANEOUS | Status: DC | PRN

## 2019-05-16 MED ORDER — LIVING WELL WITH DIABETES BOOK
Freq: Once | Status: AC
  Administered 2019-05-16: 17:00:00
  Filled 2019-05-16: qty 1

## 2019-05-16 MED ORDER — ALTEPLASE 2 MG IJ SOLR: 2.0000 mg | Freq: Once | INTRAMUSCULAR | Status: DC | PRN

## 2019-05-16 MED ORDER — SODIUM POLYSTYRENE SULFONATE 15 GM/60ML PO SUSP
15.0000 g | Freq: Once | ORAL | Status: DC
  Filled 2019-05-16: qty 60

## 2019-05-16 MED ORDER — HEPARIN SODIUM (PORCINE) 1000 UNIT/ML DIALYSIS
1000.0000 [IU] | INTRAMUSCULAR | Status: DC | PRN
  Filled 2019-05-16: qty 1

## 2019-05-16 MED ORDER — ASPIRIN EC 81 MG PO TBEC
81.0000 mg | DELAYED_RELEASE_TABLET | Freq: Every day | ORAL | Status: DC
  Administered 2019-05-16: 81 mg via ORAL
  Filled 2019-05-16: qty 1

## 2019-05-16 MED ORDER — VANCOMYCIN HCL IN DEXTROSE 1-5 GM/200ML-% IV SOLN
1000.0000 mg | INTRAVENOUS | Status: DC
  Filled 2019-05-16: qty 200

## 2019-05-16 NOTE — Progress Notes (Signed)
Living well with diabetes book was given to the patient.

## 2019-05-16 NOTE — Progress Notes (Signed)
Renal Navigator notified OP HD clinic/[facility] of patient negative COVID rapid test to provide continuity of care and safety.  Alphonzo Cruise, [place] Renal Navigator [phone]

## 2019-05-16 NOTE — Progress Notes (Signed)
[facility] Progress Note  Name: [name] MRN: [id number] DOB: 06/16/1966  Chief Complaint:  Right foot pain   Subjective:  Labs appear ordered but not obtained yet.  States that he asked if these could be drawn on HD.    Review of systems:  Denies shortness of breath or chest pain; no n/v  --------- Background on consult:  [name] is a 53 y.o. 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  Pt reports that he has been having R foot pains for nearly 1 mo now. Around the weekend of July 4th, he noted a "blister" on his R 2nd toe. Continued to walk on it and "doctor on it by myself". He was evaluated by podiatry on 7/17 and noted to have R 1st and 2nd toe ulcerations. Xray showed osteolysis of distal 1st/2nd phalanx. This wound was debrided and plan was for home health dressing changes. On 7/24, he returned for f/u appointment. Toe was worse in appearance with odor, and he was noted to be febrile and tachycardic -> he was sent to the ED for admission and planned amputation. Of note, he did not inform any of his dialysis care team of the above despite being evaluated by MD + PA's multiple times during this time frame and he refused RN monthly foot check - he reports "I didn't want any of the other patients knowing my business" regarding lack of privacy to discuss sensitive issues while on dialysis machine.  In the ED, labs showed Na 137, K 4.2, Ca 9.2, WBC 9.9, Hgb 8.6, CRP 22.6. COVID-19 testing negative. LE u/s negative for DVT. Blood and wound Cx collected and he was started on Vanc and Ceftriaxone.  He underwent partial amputation of R 2nd toe and R great toe bone biopsy on 7/25.  Today, he is having some R foot pain and he is upset with himself that he let it get this bad. Wants to make sure 100% that he has good blood flow to feet, worried about future amputations. Denies CP, dyspnea, N/V, diarrhea, or  fever.  Dialyzes on MWF schedule at [facility] center. He is very compliant with his treatments, last HD Fri 7/24. He is due for next HD tomorrow.   Intake/Output Summary (Last 24 hours) at 05/16/2019 1049 Last data filed at 05/16/2019 1037 Gross per 24 hour  Intake 480 ml  Output 200 ml  Net 280 ml    Vitals:  Vitals:   05/15/19 1936 05/15/19 1938 05/16/19 0431 05/16/19 0900  BP: (!) 175/84 (!) 171/87 (!) 141/79 (!) 158/89  Pulse: (!) 105 (!) 105 (!) 102 98  Resp: 14 14 14 18   Temp: 100 F (37.8 C) 99.5 F (37.5 C) 99.6 F (37.6 C) 99.1 F (37.3 C)  TempSrc: Oral Oral Oral Oral  SpO2:   98% 98%  Weight:      Height:         Physical Exam:  General adult male in bed in no acute distress HEENT normocephalic atraumatic extraocular movements intact sclera anicteric Neck supple trachea midline Lungs clear to auscultation bilaterally normal work of breathing at rest  Heart tachycardia; regular; no rub Abdomen soft nontender nondistended Extremities no lower extremity edema  Psych normal mood and affect Access RUE AVF with bruit and thrill    Medications reviewed   Labs:  BMP Latest Ref Rng & Units 05/15/2019 05/14/2019 05/13/2019  Glucose 70 - 99 mg/dL 190(H) 117(H) 136(H)  BUN 6 - 20 mg/dL 39(H) - 17  Creatinine 0.61 - 1.24 mg/dL 11.88(H) - 7.69(H)  Sodium 135 - 145 mmol/L 133(L) 134(L) 137  Potassium 3.5 - 5.1 mmol/L 4.3 4.3 4.2  Chloride 98 - 111 mmol/L 90(L) - 92(L)  CO2 22 - 32 mmol/L 27 - 31  Calcium 8.9 - 10.3 mg/dL 8.9 - 9.2   Dialysis Orders:  MWF at [facility] 4hr, 450/800, EDW 98kg, 2K/2.25Ca, UFP #3, AVF, heparin 2000 bolus - Hectoral 76mcg IV q HD - Mircera 64mcg IV q 2 weeks (last 7/15)   Assessment/Plan:   1.  R 1st/2nd toe osteomyelitis: S/p partial 2nd toe amputation and great toe bone Bx 7/25 - pending. On empiric Vanc/Ceftriaxone. He is certainly high risk for PAD - consider vascular eval while here. Please transition to alternate pain regimen  (instead of morphine) given his ESRD - he will accumulate metabolites from same  2.  ESRD:  Continue HD per usual MWF schedule.  Holding heparin.   3.  Hypertension/volume:  continue home EDW for now. 4.  Anemia: Hgb 7.5 - nearly due for ESA, last dose was fairly low - Aranesp is ordered. 5.  Secondary hyperparathyroidism calcium acceptable; phos ordered with renal profile - not obtained.   Auryxia on med list - says giving him constipation, requested to change to Renvela pwdr which is fine - ordered.  6.  Nutrition: Alb low, on pro-stat supplements. 7.  Type 2 DM: Insulin per primary. 8.  Hyperlipidemia: Consider statin in future.   Claudia Desanctis, MD 05/16/2019 10:49 AM

## 2019-05-16 NOTE — Anesthesia Postprocedure Evaluation (Signed)
Anesthesia Post Note  Patient: [name]  Procedure(s) Performed: PARTIAL AMPUTATION SECOND TOE RIGHT FOOT (Right ) OPEN SUPERFICIAL BONE BIOPSY GREAT TOE (Right Toe)     Patient location during evaluation: PACU Anesthesia Type: General Level of consciousness: awake and alert Pain management: pain level controlled Vital Signs Assessment: post-procedure vital signs reviewed and stable Respiratory status: spontaneous breathing, nonlabored ventilation, respiratory function stable and patient connected to nasal cannula oxygen Cardiovascular status: blood pressure returned to baseline and stable Postop Assessment: no apparent nausea or vomiting Anesthetic complications: no    Last Vitals:  Vitals:   05/16/19 0431 05/16/19 0900  BP: (!) 141/79 (!) 158/89  Pulse: (!) 102 98  Resp: 14 18  Temp: 37.6 C 37.3 C  SpO2: 98% 98%    Last Pain:  Vitals:   05/16/19 0909  TempSrc:   PainSc: 7                  Tamara Kenyon

## 2019-05-16 NOTE — Progress Notes (Signed)
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  Underwent right great toe bone biopsy and amputation of interphalangeal joint on 7/25.  Right lower extremity Dopplers was negative for DVT.  IV antibiotics were continued.   Assessment & Plan:   Principal Problem:   Osteomyelitis of great toe of right foot (HCC) Active Problems:   Essential hypertension   ESRD on dialysis ([place])   Diabetes mellitus with renal manifestation (HCC)   Diabetic foot ulcer- right second toe   Sepsis ([place])   Acute osteomyelitis of right foot ([place])   Right foot osteomyelitis status post partial second toe amputation with bone biopsy -Culture data identification is pending -Meantime continue IV antibiotics IV vancomycin and Rocephin. -Supportive care, pain control. - Abnormal ABI-discussed with vascular surgery, arterial Dopplers ordered.  They will follow-up. -Hemoglobin A1c 8.0, LDL 141. -Local dressing per podiatry. -Weightbearing as tolerated in the boot. -Right lower extremity Dopplers-negative  Peripheral vascular disease -Right-sided abnormal arterial Dopplers ordered -Consulted vascular surgery. -Aspirin and statin  Hyperlipidemia -Started atorvastatin 40 mg daily.  ESRD on hemodialysis Monday Wednesday Friday -Nephrology team following. - Continue iron asp during dialysis, Renvela and multivitamin.  Essential hypertension -Does not appear to be any home medications.  Anemia of chronic disease - Check his iron studies, B12, folate, TSH-pending  Diabetes mellitus type 2 -Insulin sliding scale Accu-Chek. -Hemoglobin A1c 8.1 -Diabetic coordinator consulted   DVT  prophylaxis: Subcutaneous heparin Code Status: Full code Family Communication: Spoke with his sister over the phone Disposition Plan: Patient has osteomyelitis requiring IV antibiotics, he is inpatient appropriate.  Needing further investigation for peripheral vascular disease.  Consultants:   Podiatry  Procedures:   Right second toe amputation 7/25  Antimicrobials:   Vancomycin  Rocephin   Subjective: Reports of lower extremity claudication with ambulation.  No other complaints.  He tells me that his cholesterol is high because of recent surgery despite of me explaining it does not have anything to do with his surgery.  Review of Systems Otherwise negative except as per HPI, including: General = no fevers, chills, dizziness, malaise, fatigue HEENT/EYES = negative for pain, redness, loss of vision, double vision, blurred vision, loss of hearing, sore throat, hoarseness, dysphagia Cardiovascular= negative for chest pain, palpitation, murmurs, lower extremity swelling Respiratory/lungs= negative for shortness of breath, cough, hemoptysis, wheezing, mucus production Gastrointestinal= negative for nausea, vomiting,, abdominal pain, melena, hematemesis Genitourinary= negative for Dysuria, Hematuria, Change in Urinary Frequency MSK = Negative for arthralgia, myalgias, Back Pain, Joint swelling  Neurology= Negative for headache, seizures, numbness, tingling  Psychiatry= Negative for anxiety, depression, suicidal and homocidal ideation Allergy/Immunology= Medication/Food allergy as listed  Skin= Negative for Rash, lesions, ulcers, itching  Objective: Vitals:   05/15/19 1938 05/16/19 0431 05/16/19 0900 05/16/19 1321  BP: (!) 171/87 (!) 141/79 (!) 158/89 (!) 154/88  Pulse: (!) 105 (!) 102 98 97  Resp: 14 14 18 18   Temp: 99.5 F (37.5 C) 99.6 F (37.6 C) 99.1 F (37.3 C) 98.2 F (36.8 C)  TempSrc: Oral Oral Oral Oral  SpO2:  98% 98% 100%  Weight:      Height:         Intake/Output Summary (Last 24 hours) at 05/16/2019 1357 Last data  filed at 05/16/2019 1037 Gross per 24 hour  Intake 240 ml  Output 200 ml  Net 40 ml   Filed Weights   05/13/19 1905  Weight: 98 kg    Examination: Constitutional: NAD, calm, comfortable Eyes: PERRL, lids and conjunctivae normal ENMT: Mucous membranes are moist. Posterior pharynx clear of any exudate or lesions.Normal dentition.  Neck: normal, supple, no masses, no thyromegaly Respiratory: clear to auscultation bilaterally, no wheezing, no crackles. Normal respiratory effort. No accessory muscle use.  Cardiovascular: Regular rate and rhythm, no murmurs / rubs / gallops. No extremity edema. 2+ pedal pulses. No carotid bruits.  Abdomen: no tenderness, no masses palpated. No hepatosplenomegaly. Bowel sounds positive.  Musculoskeletal: no clubbing / cyanosis. No joint deformity upper and lower extremities. Good ROM, no contractures. Normal muscle tone.  Skin: Right lower extremity dressing noted Neurologic: CN 2-12 grossly intact. Sensation intact, DTR normal. Strength 5/5 in all 4.  Psychiatric: Normal judgment and insight. Alert and oriented x 3. Normal mood.   Data Reviewed:   CBC: Recent Labs  Lab 05/13/19 1928 05/14/19 0736 05/14/19 0943 05/15/19 0507  WBC 9.9 9.4  --  8.6  NEUTROABS 8.0*  --   --   --   HGB 8.6* 9.7* 9.9* 7.5*  HCT 28.7* 32.1* 29.0* 23.4*  MCV 92.9 91.2  --  87.3  PLT 354 367  --  093   Basic Metabolic Panel: Recent Labs  Lab 05/13/19 1928 05/14/19 0943 05/15/19 0507  NA 137 134* 133*  K 4.2 4.3 4.3  CL 92*  --  90*  CO2 31  --  27  GLUCOSE 136* 117* 190*  BUN 17  --  39*  CREATININE 7.69*  --  11.88*  CALCIUM 9.2  --  8.9  MG  --   --  2.6*   GFR: Estimated Creatinine Clearance: 8.4 mL/min (A) (by C-G formula based on SCr of 11.88 mg/dL (H)). Liver Function Tests: Recent Labs  Lab 05/13/19 1928 05/15/19 0507  AST 14* 11*  ALT 14 13  ALKPHOS 86 80  BILITOT 0.6 0.6   PROT 7.7 6.1*  ALBUMIN 3.1* 2.4*   No results for input(s): LIPASE, AMYLASE in the last 168 hours. No results for input(s): AMMONIA in the last 168 hours. Coagulation Profile: No results for input(s): INR, PROTIME in the last 168 hours. Cardiac Enzymes: No results for input(s): CKTOTAL, CKMB, CKMBINDEX, TROPONINI in the last 168 hours. BNP (last 3 results) No results for input(s): PROBNP in the last 8760 hours. HbA1C: Recent Labs    05/15/19 0931  HGBA1C 8.0*   CBG: Recent Labs  Lab 05/15/19 1121 05/15/19 1554 05/15/19 2119 05/16/19 0635 05/16/19 1146  GLUCAP 108* 117* 151* 107* 91   Lipid Profile: Recent Labs    05/15/19 0931  CHOL 231*  HDL 43  LDLCALC 141*  TRIG 236*  CHOLHDL 5.4   Thyroid Function Tests: No results for input(s): TSH, T4TOTAL, FREET4, T3FREE, THYROIDAB in the last 72 hours. Anemia Panel: No results for input(s): VITAMINB12, FOLATE, FERRITIN, TIBC, IRON, RETICCTPCT in the last 72 hours. Sepsis Labs: Recent Labs  Lab 05/13/19 1940  LATICACIDVEN 0.6    Recent Results (from the past 240 hour(s))  WOUND CULTURE     Status: Abnormal   Collection Time: 05/06/19  5:16 PM  Result Value Ref Range Status   MICRO NUMBER: [id number]  Final   SPECIMEN QUALITY: Adequate  Final   SOURCE: NOT GIVEN  Final   STATUS: FINAL  Final  GRAM STAIN: Gram positive cocci in chains  Final    Comment: No white blood cells seen No epithelial cells seen Many Gram negative bacilli Few Gram positive cocci in chains   ISOLATE 1: Escherichia coli (A)  Final    Comment: Heavy growth of Escherichia coli   ISOLATE 2: Streptococcus agalactiae (A)  Final    Comment: Moderate growth of Group B Streptococcus isolated Beta-hemolytic Streptococci are predictably susceptible to penicillin and other beta-lactams. Susceptibility testing not routinely performed.      Susceptibility   Escherichia coli - AEROBIC CULT, GRAM STAIN NEGATIVE 1    AMOX/CLAVULANIC 4 Sensitive      AMPICILLIN 4 Sensitive     AMPICILLIN/SULBACTAM 4 Sensitive     CEFAZOLIN* <=4 Not Reportable      * For infections other than uncomplicated UTIcaused by E. coli, K. pneumoniae or P. mirabilis:Cefazolin is resistant if MIC > or = 8 mcg/mL.(Distinguishing susceptible versus intermediatefor isolates with MIC < or = 4 mcg/mL requiresadditional testing.)    CEFEPIME <=1 Sensitive     CEFTRIAXONE <=1 Sensitive     CIPROFLOXACIN <=0.25 Sensitive     LEVOFLOXACIN <=0.12 Sensitive     ERTAPENEM <=0.5 Sensitive     GENTAMICIN <=1 Sensitive     IMIPENEM <=0.25 Sensitive     PIP/TAZO <=4 Sensitive     TOBRAMYCIN <=1 Sensitive     TRIMETH/SULFA* <=20 Sensitive      * For infections other than uncomplicated UTIcaused by E. coli, K. pneumoniae or P. mirabilis:Cefazolin is resistant if MIC > or = 8 mcg/mL.(Distinguishing susceptible versus intermediatefor isolates with MIC < or = 4 mcg/mL requiresadditional testing.)Legend:S = Susceptible  I = IntermediateR = Resistant  NS = Not susceptible* = Not tested  NR = Not reported**NN = See antimicrobic comments  SARS Coronavirus 2 (Performed in [place] hospital lab)     Status: None   Collection Time: 05/13/19  1:00 PM   Specimen: Nasal Swab  Result Value Ref Range Status   SARS Coronavirus 2 NEGATIVE NEGATIVE Final    Comment: (NOTE) SARS-CoV-2 target nucleic acids are NOT DETECTED. The SARS-CoV-2 RNA is generally detectable in upper and lower respiratory specimens during the acute phase of infection. Negative results do not preclude SARS-CoV-2 infection, do not rule out co-infections with other pathogens, and should not be used as the sole basis for treatment or other patient management decisions. Negative results must be combined with clinical observations, patient history, and epidemiological information. The expected result is Negative. Fact Sheet for Patients: SugarRoll.be Fact Sheet for Healthcare Providers:  https://www.woods-mathews.com/ This test is not yet approved or cleared by the [place] FDA and  has been authorized for detection and/or diagnosis of SARS-CoV-2 by FDA under an Emergency Use Authorization (EUA). This EUA will remain  in effect (meaning this test can be used) for the duration of the COVID-19 declaration under Section 56 4(b)(1) of the Act, 21 U.S.C. section 360bbb-3(b)(1), unless the authorization is terminated or revoked sooner. Performed at [facility], [place] [address], [place], [place] [address]   SARS Coronavirus 2 (CEPHEID - Performed in [place] hospital lab), Hosp Order     Status: None   Collection Time: 05/14/19  2:32 AM   Specimen: Nasopharyngeal Swab  Result Value Ref Range Status   SARS Coronavirus 2 NEGATIVE NEGATIVE Final    Comment: (NOTE) If result is NEGATIVE SARS-CoV-2 target nucleic acids are NOT DETECTED. The SARS-CoV-2 RNA is generally detectable in upper and  lower  respiratory specimens during the acute phase of infection. The lowest  concentration of SARS-CoV-2 viral copies this assay can detect is 250  copies / mL. A negative result does not preclude SARS-CoV-2 infection  and should not be used as the sole basis for treatment or other  patient management decisions.  A negative result may occur with  improper specimen collection / handling, submission of specimen other  than nasopharyngeal swab, presence of viral mutation(s) within the  areas targeted by this assay, and inadequate number of viral copies  (<250 copies / mL). A negative result must be combined with clinical  observations, patient history, and epidemiological information. If result is POSITIVE SARS-CoV-2 target nucleic acids are DETECTED. The SARS-CoV-2 RNA is generally detectable in upper and lower  respiratory specimens dur ing the acute phase of infection.  Positive  results are indicative of active infection with SARS-CoV-2.  Clinical  correlation  with patient history and other diagnostic information is  necessary to determine patient infection status.  Positive results do  not rule out bacterial infection or co-infection with other viruses. If result is PRESUMPTIVE POSTIVE SARS-CoV-2 nucleic acids MAY BE PRESENT.   A presumptive positive result was obtained on the submitted specimen  and confirmed on repeat testing.  While 2019 novel coronavirus  (SARS-CoV-2) nucleic acids may be present in the submitted sample  additional confirmatory testing may be necessary for epidemiological  and / or clinical management purposes  to differentiate between  SARS-CoV-2 and other Sarbecovirus currently known to infect humans.  If clinically indicated additional testing with an alternate test  methodology [id number]) is advised. The SARS-CoV-2 RNA is generally  detectable in upper and lower respiratory sp ecimens during the acute  phase of infection. The expected result is Negative. Fact Sheet for Patients:  StrictlyIdeas.no Fact Sheet for Healthcare Providers: BankingDealers.co.za This test is not yet approved or cleared by the [place] FDA and has been authorized for detection and/or diagnosis of SARS-CoV-2 by FDA under an Emergency Use Authorization (EUA).  This EUA will remain in effect (meaning this test can be used) for the duration of the COVID-19 declaration under Section 564(b)(1) of the Act, 21 U.S.C. section 360bbb-3(b)(1), unless the authorization is terminated or revoked sooner. Performed at [facility], [place] [address], [place], [place] [address]   Blood culture (routine x 2)     Status: None (Preliminary result)   Collection Time: 05/14/19  2:48 AM   Specimen: BLOOD  Result Value Ref Range Status   Specimen Description BLOOD LEFT ARM  Final   Special Requests   Final    BOTTLES DRAWN AEROBIC AND ANAEROBIC Blood Culture results may not be optimal due to an inadequate  volume of blood received in culture bottles   Culture   Final    NO GROWTH 2 DAYS Performed at [facility], [place] [address], [place], [place] [address]    Report Status PENDING  Incomplete  Blood culture (routine x 2)     Status: None (Preliminary result)   Collection Time: 05/14/19  2:55 AM   Specimen: BLOOD LEFT HAND  Result Value Ref Range Status   Specimen Description BLOOD LEFT HAND  Final   Special Requests   Final    BOTTLES DRAWN AEROBIC AND ANAEROBIC Blood Culture adequate volume   Culture   Final    NO GROWTH 2 DAYS Performed at [facility], [place] [address], [place], [place] [address]  Report Status PENDING  Incomplete  MRSA PCR Screening     Status: None   Collection Time: 05/14/19  5:29 AM   Specimen: Nasal Mucosa; Nasopharyngeal  Result Value Ref Range Status   MRSA by PCR NEGATIVE NEGATIVE Final    Comment:        The GeneXpert MRSA Assay (FDA approved for NASAL specimens only), is one component of a comprehensive MRSA colonization surveillance program. It is not intended to diagnose MRSA infection nor to guide or monitor treatment for MRSA infections. Performed at [facility], [place] [address] [place], [place] [address]   Aerobic/Anaerobic Culture (surgical/deep wound)     Status: None (Preliminary result)   Collection Time: 05/14/19 11:20 AM   Specimen: Bone; Tissue  Result Value Ref Range Status   Specimen Description BONE 1ST DISTAL PHALANX  Final   Special Requests NONE  Final   Gram Stain   Final    RARE WBC PRESENT, PREDOMINANTLY PMN NO ORGANISMS SEEN    Culture   Final    NO GROWTH 2 DAYS NO ANAEROBES ISOLATED; CULTURE IN PROGRESS FOR 5 DAYS Performed at [facility], [place] [address] [place],  [address]    Report Status PENDING  Incomplete  Aerobic/Anaerobic Culture (surgical/deep wound)     Status: None (Preliminary result)   Collection Time: 05/14/19 11:21 AM   Specimen: Soft Tissue, Other  Result Value Ref  Range Status   Specimen Description TISSUE RIGHT TOE 2ND  Final   Special Requests NONE  Final   Gram Stain   Final    RARE WBC PRESENT, PREDOMINANTLY PMN RARE GRAM NEGATIVE RODS    Culture   Final    RARE ESCHERICHIA COLI CULTURE REINCUBATED FOR BETTER GROWTH HOLDING FOR POSSIBLE ANAEROBE Performed at [facility], [place] [address] [place], [place] [address]    Report Status PENDING  Incomplete   Organism ID, Bacteria ESCHERICHIA COLI  Final      Susceptibility   Escherichia coli - MIC*    AMPICILLIN 8 SENSITIVE Sensitive     CEFAZOLIN <=4 SENSITIVE Sensitive     CEFEPIME <=1 SENSITIVE Sensitive     CEFTAZIDIME <=1 SENSITIVE Sensitive     CEFTRIAXONE <=1 SENSITIVE Sensitive     CIPROFLOXACIN <=0.25 SENSITIVE Sensitive     GENTAMICIN <=1 SENSITIVE Sensitive     IMIPENEM <=0.25 SENSITIVE Sensitive     TRIMETH/SULFA <=20 SENSITIVE Sensitive     AMPICILLIN/SULBACTAM 4 SENSITIVE Sensitive     PIP/TAZO <=4 SENSITIVE Sensitive     Extended ESBL NEGATIVE Sensitive     * RARE ESCHERICHIA COLI         Radiology Studies: Vas [place] Abi With/wo Tbi  Result Date: 05/15/2019 LOWER EXTREMITY DOPPLER STUDY Indications: Ulceration, gangrene, and Osteomyelitis. Status post toe amputation              and right hallux bone biopsy 05/14/19 High Risk Factors: Hypertension, hyperlipidemia, Diabetes. Other Factors: ESRD on dialysis.  Limitations: Bandages, surgery 05/14/19 Comparison Study: No prior study on file for comparison. Performing Technologist: Sharion Dove RVS  Examination Guidelines: A complete evaluation includes at minimum, Doppler waveform signals and systolic blood pressure reading at the level of bilateral brachial, anterior tibial, and posterior tibial arteries, when vessel segments are accessible. Bilateral testing is considered an integral part of a complete examination. Photoelectric Plethysmograph (PPG) waveforms and toe systolic pressure readings are included as required and  additional duplex testing as needed. Limited examinations for  reoccurring indications may be performed as noted.  ABI Findings: +---------+------------------+-----+--------+---------------+ Right    Rt Pressure (mmHg)IndexWaveformComment         +---------+------------------+-----+--------+---------------+ Brachial                                Dialysis access +---------+------------------+-----+--------+---------------+ PTA      255               1.42 biphasic                +---------+------------------+-----+--------+---------------+ DP                                      bandages        +---------+------------------+-----+--------+---------------+ Great Toe                               bandages        +---------+------------------+-----+--------+---------------+ +---------+------------------+-----+----------+-------+ Left     Lt Pressure (mmHg)IndexWaveform  Comment +---------+------------------+-----+----------+-------+ Brachial 180                    triphasic         +---------+------------------+-----+----------+-------+ PTA      254               1.41 biphasic          +---------+------------------+-----+----------+-------+ DP       119               0.66 monophasic        +---------+------------------+-----+----------+-------+ Great Toe86                0.48                   +---------+------------------+-----+----------+-------+ +-------+-----------+-----------+------------+------------+ ABI/TBIToday's ABIToday's TBIPrevious ABIPrevious TBI +-------+-----------+-----------+------------+------------+ Right  1.4        bandages                            +-------+-----------+-----------+------------+------------+ Left   1.4        0.48                                +-------+-----------+-----------+------------+------------+ Arterial wall calcification precludes accurate ankle pressures and ABIs. No prior study on file for  comparison  Summary: Right: Resting right ankle-brachial index indicates noncompressible right lower extremity arteries. Left: Resting left ankle-brachial index indicates noncompressible left lower extremity arteries.The left toe-brachial index is abnormal.  *See table(s) above for measurements and observations.  Electronically signed by Deitra Mayo MD on 05/15/2019 at 7:07:21 PM.    Final    Vas [place] Lower Extremity Arterial Duplex  Result Date: 05/16/2019 LOWER EXTREMITY ARTERIAL DUPLEX STUDY Indications: Ulceration, gangrene, and Osteomyelitis. Status post toe amputation              and right hallux bone biopsy 05/14/19. High Risk Factors: Hypertension, hyperlipidemia, Diabetes.  Current ABI: 05/15/19 non compressible Comparison Study: no prior Performing Technologist: June Leap RDMS, RVT  Examination Guidelines: A complete evaluation includes B-mode imaging, spectral Doppler, color Doppler, and power Doppler as needed of all accessible portions of each vessel. Bilateral testing is considered an integral part of a complete examination. Limited examinations  for reoccurring indications may be performed as noted.  +-----------+--------+-----+--------+----------+-------------------------------+ RIGHT      PSV cm/sRatioStenosisWaveform  Comments                        +-----------+--------+-----+--------+----------+-------------------------------+ CFA Prox   100                  triphasic mild heterogenous plaque        +-----------+--------+-----+--------+----------+-------------------------------+ DFA        78                   triphasic                                 +-----------+--------+-----+--------+----------+-------------------------------+ SFA Prox   125                  triphasic                                 +-----------+--------+-----+--------+----------+-------------------------------+ SFA Mid    125                  triphasic                                  +-----------+--------+-----+--------+----------+-------------------------------+ SFA Distal 140                  triphasic calcified plaque with shadowing +-----------+--------+-----+--------+----------+-------------------------------+ POP Prox   112                  triphasic mild heterogenous plaque        +-----------+--------+-----+--------+----------+-------------------------------+ POP Distal 84                   biphasic                                  +-----------+--------+-----+--------+----------+-------------------------------+ ATA Distal 19                   biphasic  calcification of vessel walls   +-----------+--------+-----+--------+----------+-------------------------------+ PTA Distal 90                   monophasiccalcification of vessel walls   +-----------+--------+-----+--------+----------+-------------------------------+ PERO Distal                     absent    calcification of vessel walls   +-----------+--------+-----+--------+----------+-------------------------------+  Summary: Right: Calcification of pedal arteries with dampened flow. Absent peroneal artery flow. No focal stenosis or occlusion noted.  See table(s) above for measurements and observations.    Preliminary         Scheduled Meds: . Chlorhexidine Gluconate Cloth  6 each Topical Q0600  . darbepoetin (ARANESP) injection - DIALYSIS  100 mcg Intravenous Q Mon-HD  . docusate sodium  100 mg Oral BID  . doxercalciferol  6 mcg Intravenous Q M,W,F-HD  . feeding supplement (PRO-STAT SUGAR FREE 64)  30 mL Oral BID  . heparin injection (subcutaneous)  5,000 Units Subcutaneous Q8H  . insulin aspart  0-5 Units Subcutaneous QHS  . insulin aspart  0-9 Units Subcutaneous TID WC  . multivitamin  1 tablet Oral Q M,W,F-1800  . sevelamer carbonate  2.4 g  Oral TID WC   Continuous Infusions: . sodium chloride    . sodium chloride    . cefTRIAXone (ROCEPHIN)  IV 1 g (05/15/19 2140)   . vancomycin 1,000 mg (05/16/19 1200)     LOS: 1 day   Time spent= 35 mins    Dray Dente Arsenio Loader, MD [facility]  If 7PM-7AM, please contact night-coverage [email or web address] 05/16/2019, 1:57 PM

## 2019-05-16 NOTE — Progress Notes (Signed)
HD order modified to 05/17/2019 by Dr Royce Macadamia, Notified primary RN Caren Griffins at [phone]

## 2019-05-16 NOTE — H&P (View-Only) (Signed)
Referring Physician: Dr Reesa Chew hospitalist service  Patient name: [name] MRN: [id number] DOB: 29-Oct-1965 Sex: male  REASON FOR CONSULT: Osteomyelitis right foot with abnormal ABIs  HPI: [name] is a 53 y.o. male, POD2 right 2nd toe amp and first toe bone biopsy by Dr March Rummage.  I was called to see the patient after the patient had recent abnormal ABIs.  Patient states that he does not really know how his second toe became injured but he had some pain just above his right ankle and then a wound appeared on his right second toe.  He has no history of atrial fibrillation.  He has not had any prior procedures on his feet.  He has been on hemodialysis for 8 years.  He has had diabetes for similar amount of time.  He is a non-smoker.  He does not really describe claudication symptoms. Other medical problems include end-stage renal disease Monday Wednesday Friday dialysis, diabetes, hypertension all of which are currently stable.  He dialyzes via right upper arm AV fistula.  Past Medical History:  Diagnosis Date  . Chills    at night - sometimes  . Complication of anesthesia   . Diabetes mellitus    controlled by diet  . 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 Eczema   . Fatigue    loss of fatigue  . Headache(784.0)    Years ago  . Heart murmur    years ago  . History of blood transfusion   . Hypertension    sees Dr. Jaci Standard  . MRSA (methicillin resistant staph aureus) culture positive   . PONV (postoperative nausea and vomiting)   . Poor circulation   . Renal disorder    Past Surgical History:  Procedure Laterality Date  . AV FISTULA PLACEMENT  06-18-11   Right brachiocephalic AVF  . EYE SURGERY     left eye for Laser, diabetic retinopathy  . FISTULOGRAM Right 11/06/2011   Procedure: FISTULOGRAM;  Surgeon: Conrad Russell, MD;  Location: [facility] CATH LAB;  Service: Cardiovascular;  Laterality: Right;  . HEMATOMA EVACUATION Right Feb. 25, 2014  . HEMATOMA EVACUATION  Right 12/14/2012   Procedure: EVACUATION HEMATOMA;  Surgeon: Conrad Zeb, MD;  Location: [place];  Service: Vascular;  Laterality: Right;  . INSERTION OF DIALYSIS CATHETER  12/14/2012   Procedure: INSERTION OF DIALYSIS CATHETER;  Surgeon: Conrad [place], MD;  Location: [place];  Service: Vascular;;  . REVISON OF ARTERIOVENOUS FISTULA Right 12/14/2012   Procedure: REVISON OF upper arm ARTERIOVENOUS FISTULA using 51mmx10cm gortex graft;  Surgeon: Conrad , MD;  Location: [place];  Service: Vascular;  Laterality: Right;  . SOFT TISSUE MASS EXCISION     Right arm, Left leg  for MRSA infection    Family History  Problem Relation Age of Onset  . Diabetes Mother     SOCIAL HISTORY: Social History   Socioeconomic History  . Marital status: Married    Spouse name: Not on file  . Number of children: Not on file  . Years of education: Not on file  . Highest education level: Not on file  Occupational History  . Not on file  Social Needs  . Financial resource strain: Not on file  . Food insecurity    Worry: Not on file    Inability: Not on file  . Transportation needs    Medical: Not on file    Non-medical: Not on file  Tobacco Use  .  Smoking status: Never Smoker  . Smokeless tobacco: Never Used  Substance and Sexual Activity  . Alcohol use: No  . Drug use: No  . Sexual activity: Yes  Lifestyle  . Physical activity    Days per week: Not on file    Minutes per session: Not on file  . Stress: Not on file  Relationships  . Social Herbalist on phone: Not on file    Gets together: Not on file    Attends religious service: Not on file    Active member of club or organization: Not on file    Attends meetings of clubs or organizations: Not on file    Relationship status: Not on file  . Intimate partner violence    Fear of current or ex partner: Not on file    Emotionally abused: Not on file    Physically abused: Not on file    Forced sexual activity: Not on file  Other  Topics Concern  . Not on file  Social History Narrative  . Not on file    Allergies  Allergen Reactions  . Amoxicillin Itching    Did it involve swelling of the face/tongue/throat, SOB, or low BP? Unknown Did it involve sudden or severe rash/hives, skin peeling, or any reaction on the inside of your mouth or nose? Unknown Did you need to seek medical attention at a hospital or doctor's office? Unknown When did it last happen?20 years ago If all above answers are "NO", may proceed with cephalosporin use.   Marland Kitchen Hydromorphone Itching    Patient states he may take with benadryl. Makes feet itch.  Marland Kitchen Percocet [Oxycodone-Acetaminophen] Itching and Other (See Comments)    Legs only. Itching and burning feeling.    Current Facility-Administered Medications  Medication Dose Route Frequency Provider Last Rate Last Dose  . 0.9 %  sodium chloride infusion  100 mL Intravenous PRN Loren Racer, PA-C      . 0.9 %  sodium chloride infusion  100 mL Intravenous PRN Loren Racer, PA-C      . acetaminophen (TYLENOL) tablet 650 mg  650 mg Oral Q6H PRN Evelina Bucy, DPM   650 mg at 05/16/19 0919   Or  . acetaminophen (TYLENOL) suppository 650 mg  650 mg Rectal Q6H PRN Evelina Bucy, DPM      . alteplase (CATHFLO ACTIVASE) injection 2 mg  2 mg Intracatheter Once PRN Loren Racer, PA-C      . alum & mag hydroxide-simeth (MAALOX/MYLANTA) 200-200-20 MG/5ML suspension 30 mL  30 mL Oral Q4H PRN Damita Lack, MD   30 mL at 05/15/19 0119  . aspirin EC tablet 81 mg  81 mg Oral Daily Amin, Ankit Chirag, MD      . atorvastatin (LIPITOR) tablet 40 mg  40 mg Oral q1800 Amin, Ankit Chirag, MD      . cefTRIAXone (ROCEPHIN) 1 g in sodium chloride 0.9 % 100 mL IVPB  1 g Intravenous Q24H Evelina Bucy, DPM 200 mL/hr at 05/15/19 2140 1 g at 05/15/19 2140  . Chlorhexidine Gluconate Cloth 2 % PADS 6 each  6 each Topical Q0600 Loren Racer, PA-C   6 each at 05/15/19 1249  .  Darbepoetin Alfa (ARANESP) injection 100 mcg  100 mcg Intravenous Q Mon-HD Loren Racer, PA-C      . diphenhydrAMINE (BENADRYL) capsule 25 mg  25 mg Oral Q6H PRN Evelina Bucy, DPM   25 mg  at 05/15/19 1848  . docusate sodium (COLACE) capsule 100 mg  100 mg Oral BID Amin, Ankit Chirag, MD   100 mg at 05/16/19 0919  . doxercalciferol (HECTOROL) injection 6 mcg  6 mcg Intravenous Q M,W,F-HD Stovall, Woodfin Ganja, PA-C      . feeding supplement (PRO-STAT SUGAR FREE 64) liquid 30 mL  30 mL Oral BID Loren Racer, PA-C   30 mL at 05/16/19 0919  . heparin injection 1,000 Units  1,000 Units Dialysis PRN Loren Racer, PA-C      . heparin injection 5,000 Units  5,000 Units Subcutaneous Q8H Damita Lack, MD   5,000 Units at 05/15/19 1502  . hydrALAZINE (APRESOLINE) injection 5 mg  5 mg Intravenous Q2H PRN Evelina Bucy, DPM      . hydrocortisone (ANUSOL-HC) 2.5 % rectal cream 1 application  1 application Topical QID PRN Amin, Ankit Chirag, MD      . hydrocortisone cream 1 % 1 application  1 application Topical TID PRN Amin, Ankit Chirag, MD      . insulin aspart (novoLOG) injection 0-5 Units  0-5 Units Subcutaneous QHS Evelina Bucy, DPM      . insulin aspart (novoLOG) injection 0-9 Units  0-9 Units Subcutaneous TID WC Evelina Bucy, DPM   2 Units at 05/15/19 [phone]  . lidocaine (PF) (XYLOCAINE) 1 % injection 5 mL  5 mL Intradermal PRN Loren Racer, PA-C      . lidocaine-prilocaine (EMLA) cream 1 application  1 application Topical PRN Stovall, Woodfin Ganja, PA-C      . lip balm (CARMEX) ointment 1 application  1 application Topical PRN Amin, Ankit Chirag, MD      . loratadine (CLARITIN) tablet 10 mg  10 mg Oral Daily PRN Evelina Bucy, DPM      . Melatonin TABS 9 mg  9 mg Oral QHS PRN Evelina Bucy, DPM      . morphine 2 MG/ML injection 2 mg  2 mg Intravenous Q4H PRN Evelina Bucy, DPM   2 mg at 05/16/19 8588  . multivitamin (RENA-VIT) tablet 1 tablet  1 tablet Oral Q  M,W,F-1800 Evelina Bucy, DPM      . Muscle Rub CREA 1 application  1 application Topical PRN Amin, Ankit Chirag, MD      . ondansetron (ZOFRAN) tablet 4 mg  4 mg Oral Q6H PRN Evelina Bucy, DPM       Or  . ondansetron (ZOFRAN) injection 4 mg  4 mg Intravenous Q6H PRN Evelina Bucy, DPM      . pentafluoroprop-tetrafluoroeth (GEBAUERS) aerosol 1 application  1 application Topical PRN Stovall, Woodfin Ganja, PA-C      . phenol (CHLORASEPTIC) mouth spray 1 spray  1 spray Mouth/Throat PRN Amin, Ankit Chirag, MD      . polyethylene glycol (MIRALAX / GLYCOLAX) packet 17 g  17 g Oral Daily PRN Amin, Ankit Chirag, MD      . polyvinyl alcohol (LIQUIFILM TEARS) 1.4 % ophthalmic solution 1 drop  1 drop Both Eyes PRN Amin, Ankit Chirag, MD      . senna-docusate (Senokot-S) tablet 1 tablet  1 tablet Oral QHS PRN Evelina Bucy, DPM      . sevelamer carbonate (RENVELA) powder PACK 2.4 g  2.4 g Oral TID WC Stephania Fragmin R, PA-C   2.4 g at 05/15/19 1248  . sodium chloride (OCEAN) 0.65 % nasal spray 1 spray  1 spray Each Nare  PRN Damita Lack, MD      . vancomycin (VANCOCIN) IVPB 1000 mg/200 mL premix  1,000 mg Intravenous Q M,W,F-HD Nyoka Cowden, Terri L, RPH 200 mL/hr at 05/16/19 1200 1,000 mg at 05/16/19 1200    ROS:   General:  No weight loss, Fever, chills  HEENT: No recent headaches, no nasal bleeding, no visual changes, no sore throat  Neurologic: No dizziness, blackouts, seizures. No recent symptoms of stroke or mini- stroke. No recent episodes of slurred speech, or temporary blindness.  Cardiac: No recent episodes of chest pain/pressure, no shortness of breath at rest.  No shortness of breath with exertion.  Denies history of atrial fibrillation or irregular heartbeat  Vascular: No history of rest pain in feet.  No history of claudication.  No history of non-healing ulcer, No history of DVT   Pulmonary: No home oxygen, no productive cough, no hemoptysis,  No asthma or wheezing   Musculoskeletal:  [ ]  Arthritis, [ ]  Low back pain,  [ ]  Joint pain  Hematologic:No history of hypercoagulable state.  No history of easy bleeding.  No history of anemia  Gastrointestinal: No hematochezia or melena,  No gastroesophageal reflux, no trouble swallowing  Urinary: [X]  chronic Kidney disease, [X]  on HD - [X]  MWF or [ ]  TTHS, [ ]  Burning with urination, [ ]  Frequent urination, [ ]  Difficulty urinating;   Skin: No rashes  Psychological: No history of anxiety,  No history of depression   Physical Examination  Vitals:   05/15/19 1938 05/16/19 0431 05/16/19 0900 05/16/19 1321  BP: (!) 171/87 (!) 141/79 (!) 158/89 (!) 154/88  Pulse: (!) 105 (!) 102 98 97  Resp: 14 14 18 18   Temp: 99.5 F (37.5 C) 99.6 F (37.6 C) 99.1 F (37.3 C) 98.2 F (36.8 C)  TempSrc: Oral Oral Oral Oral  SpO2:  98% 98% 100%  Weight:      Height:        Body mass index is 31 kg/m.  General:  Alert and oriented, no acute distress HEENT: Normal Cardiac: Regular Rate and Rhythm without murmur Skin: No rash, suture lateral aspect right first toe, recent 2nd toe amp with staples all other digits intact Extremity Pulses:  2+ radial, brachial, femoral, 2+ right popliteal absent left popliteal pulse absent dorsalis pedis, posterior tibial pulses bilaterally Musculoskeletal: No deformity or edema other than above  Neurologic: Upper and lower extremity motor 5/5 and symmetric  DATA:  CBC    Component Value Date/Time   WBC 8.6 05/15/2019 0507   RBC 2.68 (L) 05/15/2019 0507   HGB 7.5 (L) 05/15/2019 0507   HCT 23.4 (L) 05/15/2019 0507   PLT 303 05/15/2019 0507   MCV 87.3 05/15/2019 0507   MCH 28.0 05/15/2019 0507   MCHC 32.1 05/15/2019 0507   RDW 16.5 (H) 05/15/2019 0507   LYMPHSABS 1.0 05/13/2019 1928   MONOABS 0.7 05/13/2019 1928   EOSABS 0.0 05/13/2019 1928   BASOSABS 0.0 05/13/2019 1928    BMET    Component Value Date/Time   NA 133 (L) 05/15/2019 0507   K 4.3 05/15/2019 0507   CL 90  (L) 05/15/2019 0507   CO2 27 05/15/2019 0507   GLUCOSE 190 (H) 05/15/2019 0507   BUN 39 (H) 05/15/2019 0507   CREATININE 11.88 (H) 05/15/2019 0507   CALCIUM 8.9 05/15/2019 0507   CALCIUM 7.9 (L) 08/31/2009 1524   GFRNONAA 4 (L) 05/15/2019 0507   GFRAA 5 (L) 05/15/2019 0507    ABI: 1.4 bilaterally  noncompressible calcified nondiagnostic  Duplex right leg triphasic flow all the way to the popliteal artery and biphasic to monophasic flow distal  ASSESSMENT: Patient with recent second toe amputation calcified vessels ABIs nondiagnostic duplex ultrasound suggest vessels are patent but none determining of whether or not he has significant distal tibial disease   PLAN: Abdominal aortogram lower extremity runoff possible intervention tomorrow  N.p.o. after midnight  Consent  Risk benefits possible complications of procedure details were discussed with the patient and his wife by phone today.  She understands he understands they both agree to proceed.   Ruta Hinds, MD [facility] Office: [phone] Pager: [phone]

## 2019-05-16 NOTE — Plan of Care (Signed)
  Problem: Education: Goal: Knowledge of General Education information will improve Description: Including pain rating scale, medication(s)/side effects and non-pharmacologic comfort measures 05/16/2019 0044 by Claire Shown, RN Outcome: Progressing 05/15/2019 2338 by Claire Shown, RN Outcome: Progressing   Problem: Health Behavior/Discharge Planning: Goal: Ability to manage health-related needs will improve 05/16/2019 0044 by Claire Shown, RN Outcome: Progressing 05/15/2019 2338 by Claire Shown, RN Outcome: Progressing   Problem: Clinical Measurements: Goal: Will remain free from infection 05/16/2019 0044 by Claire Shown, RN Outcome: Progressing 05/15/2019 2338 by Claire Shown, RN Outcome: Progressing   Problem: Activity: Goal: Risk for activity intolerance will decrease 05/16/2019 0044 by Claire Shown, RN Outcome: Progressing 05/15/2019 2338 by Claire Shown, RN Outcome: Progressing   Problem: Pain Managment: Goal: General experience of comfort will improve 05/16/2019 0044 by Claire Shown, RN Outcome: Progressing 05/15/2019 2338 by Claire Shown, RN Outcome: Progressing   Problem: Safety: Goal: Ability to remain free from injury will improve 05/16/2019 0044 by Claire Shown, RN Outcome: Progressing 05/15/2019 2338 by Claire Shown, RN Outcome: Progressing   Problem: Skin Integrity: Goal: Risk for impaired skin integrity will decrease 05/16/2019 0044 by Claire Shown, RN Outcome: Progressing 05/15/2019 2338 by Claire Shown, RN Outcome: Progressing

## 2019-05-16 NOTE — Progress Notes (Signed)
Dr Reesa Chew has been sent a text message chat regarding the patient refusing heparin today.  The patient seems irritated that his dialysis has been cancelled today.  He states that he does not want heparin because it will make him bleed more tomorrow in dialysis.  Lab is at the bedside- awaiting blood draw from the IV team person.

## 2019-05-16 NOTE — Progress Notes (Signed)
IV team consulted due to the patient co having painfu IV site.  Patient awaiting dialysis treatment

## 2019-05-16 NOTE — Progress Notes (Signed)
Inpatient Diabetes Program Recommendations  AACE/ADA: New Consensus Statement on Inpatient Glycemic Control (2015)  Target Ranges:  Prepandial:   less than 140 mg/dL      Peak postprandial:   less than 180 mg/dL (1-2 hours)      Critically ill patients:  140 - 180 mg/dL   Lab Results  Component Value Date   GLUCAP 91 05/16/2019   HGBA1C 8.0 (H) 05/15/2019    Review of Glycemic Control  Diabetes history: DM2 Outpatient Diabetes medications: Diet controlled Current orders for Inpatient glycemic control: Novolog correction sensitive tid  Inpatient Diabetes Program Recommendations:   Received consult. Noted A1c 8.0 and patient has been diet controlled. Ordered Living well with diabetes and plan to see pt in am.  Thank you, Nani Gasser. Billy Rocco, RN, MSN, CDE  Diabetes Coordinator Inpatient Glycemic Control Team Team Pager [phone] (8am-5pm) 05/16/2019 4:27 PM

## 2019-05-16 NOTE — Progress Notes (Signed)
Subjective:  Patient ID: [name], male    DOB: [birth year],  MRN: [id number]  Seen this AM. Denies complaints. Seen prior to HD. Objective:   Vitals:   05/16/19 1321 05/16/19 2020  BP: (!) 154/88 (!) 174/80  Pulse: 97 (!) 103  Resp: 18 18  Temp: 98.2 F (36.8 C) 99 F (37.2 C)  SpO2: 100% 98%   General AA&O x3. Normal mood and affect.  Vascular Right foot warm and well perfused.  Neurologic Epicritic sensation grossly intact.  Dermatologic Surgical wound appears to be healing without necrosis. No warmth, erythema, signs of acute infection.   Orthopedic: MMT 5/5 in dorsiflexion, plantarflexion, inversion, and eversion. Normal joint ROM without pain or crepitus.   Results for orders placed or performed during the hospital encounter of 05/13/19  SARS Coronavirus 2 (CEPHEID - Performed in [place] hospital lab), Hosp Order     Status: None   Collection Time: 05/14/19  2:32 AM   Specimen: Nasopharyngeal Swab  Result Value Ref Range Status   SARS Coronavirus 2 NEGATIVE NEGATIVE Final    Comment: (NOTE) If result is NEGATIVE SARS-CoV-2 target nucleic acids are NOT DETECTED. The SARS-CoV-2 RNA is generally detectable in upper and lower  respiratory specimens during the acute phase of infection. The lowest  concentration of SARS-CoV-2 viral copies this assay can detect is 250  copies / mL. A negative result does not preclude SARS-CoV-2 infection  and should not be used as the sole basis for treatment or other  patient management decisions.  A negative result may occur with  improper specimen collection / handling, submission of specimen other  than nasopharyngeal swab, presence of viral mutation(s) within the  areas targeted by this assay, and inadequate number of viral copies  (<250 copies / mL). A negative result must be combined with clinical  observations, patient history, and epidemiological information. If result is POSITIVE SARS-CoV-2 target nucleic acids are  DETECTED. The SARS-CoV-2 RNA is generally detectable in upper and lower  respiratory specimens dur ing the acute phase of infection.  Positive  results are indicative of active infection with SARS-CoV-2.  Clinical  correlation with patient history and other diagnostic information is  necessary to determine patient infection status.  Positive results do  not rule out bacterial infection or co-infection with other viruses. If result is PRESUMPTIVE POSTIVE SARS-CoV-2 nucleic acids MAY BE PRESENT.   A presumptive positive result was obtained on the submitted specimen  and confirmed on repeat testing.  While 2019 novel coronavirus  (SARS-CoV-2) nucleic acids may be present in the submitted sample  additional confirmatory testing may be necessary for epidemiological  and / or clinical management purposes  to differentiate between  SARS-CoV-2 and other Sarbecovirus currently known to infect humans.  If clinically indicated additional testing with an alternate test  methodology [id number]) is advised. The SARS-CoV-2 RNA is generally  detectable in upper and lower respiratory sp ecimens during the acute  phase of infection. The expected result is Negative. Fact Sheet for Patients:  StrictlyIdeas.no Fact Sheet for Healthcare Providers: BankingDealers.co.za This test is not yet approved or cleared by the [place] FDA and has been authorized for detection and/or diagnosis of SARS-CoV-2 by FDA under an Emergency Use Authorization (EUA).  This EUA will remain in effect (meaning this test can be used) for the duration of the COVID-19 declaration under Section 564(b)(1) of the Act, 21 U.S.C. section 360bbb-3(b)(1), unless the authorization is terminated or revoked sooner. Performed at [facility]  Lab, [address] [place], [place] [address]   Blood culture (routine x 2)     Status: None (Preliminary result)   Collection Time: 05/14/19  2:48  AM   Specimen: BLOOD  Result Value Ref Range Status   Specimen Description BLOOD LEFT ARM  Final   Special Requests   Final    BOTTLES DRAWN AEROBIC AND ANAEROBIC Blood Culture results may not be optimal due to an inadequate volume of blood received in culture bottles   Culture   Final    NO GROWTH 2 DAYS Performed at [facility], [place] [address], [place], [place] [address]    Report Status PENDING  Incomplete  Blood culture (routine x 2)     Status: None (Preliminary result)   Collection Time: 05/14/19  2:55 AM   Specimen: BLOOD LEFT HAND  Result Value Ref Range Status   Specimen Description BLOOD LEFT HAND  Final   Special Requests   Final    BOTTLES DRAWN AEROBIC AND ANAEROBIC Blood Culture adequate volume   Culture   Final    NO GROWTH 2 DAYS Performed at [facility], [place] [address] [place], [place] [address]    Report Status PENDING  Incomplete  MRSA PCR Screening     Status: None   Collection Time: 05/14/19  5:29 AM   Specimen: Nasal Mucosa; Nasopharyngeal  Result Value Ref Range Status   MRSA by PCR NEGATIVE NEGATIVE Final    Comment:        The GeneXpert MRSA Assay (FDA approved for NASAL specimens only), is one component of a comprehensive MRSA colonization surveillance program. It is not intended to diagnose MRSA infection nor to guide or monitor treatment for MRSA infections. Performed at [facility], [place] [address] [place], [place] [address]   Aerobic/Anaerobic Culture (surgical/deep wound)     Status: None (Preliminary result)   Collection Time: 05/14/19 11:20 AM   Specimen: Bone; Tissue  Result Value Ref Range Status   Specimen Description BONE 1ST DISTAL PHALANX  Final   Special Requests NONE  Final   Gram Stain   Final    RARE WBC PRESENT, PREDOMINANTLY PMN NO ORGANISMS SEEN    Culture   Final    NO GROWTH 2 DAYS NO ANAEROBES ISOLATED; CULTURE IN PROGRESS FOR 5 DAYS Performed at [facility], [place] [address]  [place], [place] [address]    Report Status PENDING  Incomplete  Aerobic/Anaerobic Culture (surgical/deep wound)     Status: None (Preliminary result)   Collection Time: 05/14/19 11:21 AM   Specimen: Soft Tissue, Other  Result Value Ref Range Status   Specimen Description TISSUE RIGHT TOE 2ND  Final   Special Requests NONE  Final   Gram Stain   Final    RARE WBC PRESENT, PREDOMINANTLY PMN RARE GRAM NEGATIVE RODS    Culture   Final    RARE ESCHERICHIA COLI RARE HAEMOPHILUS PARAINFLUENZAE BETA LACTAMASE NEGATIVE HOLDING FOR POSSIBLE ANAEROBE Performed at [facility], [place] [address], [place], [place] [address]    Report Status PENDING  Incomplete   Organism ID, Bacteria ESCHERICHIA COLI  Final      Susceptibility   Escherichia coli - MIC*    AMPICILLIN 8 SENSITIVE Sensitive     CEFAZOLIN <=4 SENSITIVE Sensitive     CEFEPIME <=1 SENSITIVE Sensitive     CEFTAZIDIME <=1 SENSITIVE Sensitive     CEFTRIAXONE <=1 SENSITIVE Sensitive  CIPROFLOXACIN <=0.25 SENSITIVE Sensitive     GENTAMICIN <=1 SENSITIVE Sensitive     IMIPENEM <=0.25 SENSITIVE Sensitive     TRIMETH/SULFA <=20 SENSITIVE Sensitive     AMPICILLIN/SULBACTAM 4 SENSITIVE Sensitive     PIP/TAZO <=4 SENSITIVE Sensitive     Extended ESBL NEGATIVE Sensitive     * RARE ESCHERICHIA COLI    Results for orders placed or performed during the hospital encounter of 05/13/19 (from the past 24 hour(s))  Glucose, capillary     Status: Abnormal   Collection Time: 05/15/19  9:19 PM  Result Value Ref Range   Glucose-Capillary 151 (H) 70 - 99 mg/dL  Glucose, capillary     Status: Abnormal   Collection Time: 05/16/19  6:35 AM  Result Value Ref Range   Glucose-Capillary 107 (H) 70 - 99 mg/dL  Glucose, capillary     Status: None   Collection Time: 05/16/19 11:46 AM  Result Value Ref Range   Glucose-Capillary 91 70 - 99 mg/dL  TSH     Status: None   Collection Time: 05/16/19  3:21 PM  Result Value Ref Range   TSH 1.013 0.350 -  4.500 uIU/mL  Ferritin     Status: Abnormal   Collection Time: 05/16/19  3:22 PM  Result Value Ref Range   Ferritin 1,270 (H) 24 - 336 ng/mL  Vitamin B12     Status: Abnormal   Collection Time: 05/16/19  3:22 PM  Result Value Ref Range   Vitamin B-12 1,094 (H) 180 - 914 pg/mL  Iron and TIBC     Status: Abnormal   Collection Time: 05/16/19  3:22 PM  Result Value Ref Range   Iron 18 (L) 45 - 182 ug/dL   TIBC 104 (L) 250 - 450 ug/dL   Saturation Ratios 17 (L) 17.9 - 39.5 %   UIBC 86 ug/dL  CBC     Status: Abnormal   Collection Time: 05/16/19  3:22 PM  Result Value Ref Range   WBC 9.7 4.0 - 10.5 K/uL   RBC 2.92 (L) 4.22 - 5.81 MIL/uL   Hemoglobin 8.1 (L) 13.0 - 17.0 g/dL   HCT 25.3 (L) 39.0 - 52.0 %   MCV 86.6 80.0 - 100.0 fL   MCH 27.7 26.0 - 34.0 pg   MCHC 32.0 30.0 - 36.0 g/dL   RDW 16.5 (H) 11.5 - 15.5 %   Platelets 389 150 - 400 K/uL   nRBC 0.0 0.0 - 0.2 %  Magnesium     Status: Abnormal   Collection Time: 05/16/19  3:22 PM  Result Value Ref Range   Magnesium 2.7 (H) 1.7 - 2.4 mg/dL  Comprehensive metabolic panel     Status: Abnormal   Collection Time: 05/16/19  3:22 PM  Result Value Ref Range   Sodium 134 (L) 135 - 145 mmol/L   Potassium 5.2 (H) 3.5 - 5.1 mmol/L   Chloride 89 (L) 98 - 111 mmol/L   CO2 27 22 - 32 mmol/L   Glucose, Bld 108 (H) 70 - 99 mg/dL   BUN 60 (H) 6 - 20 mg/dL   Creatinine, Ser 15.37 (H) 0.61 - 1.24 mg/dL   Calcium 8.9 8.9 - 10.3 mg/dL   Total Protein 6.0 (L) 6.5 - 8.1 g/dL   Albumin 2.7 (L) 3.5 - 5.0 g/dL   AST 21 15 - 41 U/L   ALT 18 0 - 44 U/L   Alkaline Phosphatase 132 (H) 38 - 126 U/L   Total Bilirubin 0.8 0.3 -  1.2 mg/dL   GFR calc non Af Amer 3 (L) >60 mL/min   GFR calc Af Amer 4 (L) >60 mL/min   Anion gap 18 (H) 5 - 15  Phosphorus     Status: Abnormal   Collection Time: 05/16/19  3:22 PM  Result Value Ref Range   Phosphorus 7.5 (H) 2.5 - 4.6 mg/dL  Glucose, capillary     Status: Abnormal   Collection Time: 05/16/19  4:23 PM   Result Value Ref Range   Glucose-Capillary 108 (H) 70 - 99 mg/dL  Glucose, capillary     Status: Abnormal   Collection Time: 05/16/19  8:34 PM  Result Value Ref Range   Glucose-Capillary 106 (H) 70 - 99 mg/dL    Assessment & Plan:  Patient was evaluated and treated and all questions answered.  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 Pan sensitive E coli, H parainfluenzae soft tissue 2nd toe. No growth distal phalanx of hallux. -Path pending. -Continue Empiric Abx. Await cultures. 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 Will likely need extended IV abx with HD for distal phalanx OM depending upon culture. -Daily dressing by nursing for Right foot wound. -Vascular studies reviewed. Vascular planning angio tomorrow. -Will continue to follow. -WBAT in boot.  Evelina Bucy, DPM  Accessible via secure chat for questions or concerns.

## 2019-05-16 NOTE — Consult Note (Signed)
Referring Physician: Dr Reesa Chew hospitalist service  Patient name: [name] MRN: [id number] DOB: 23-Nov-1965 Sex: male  REASON FOR CONSULT: Osteomyelitis right foot with abnormal ABIs  HPI: [name] is a 53 y.o. male, POD2 right 2nd toe amp and first toe bone biopsy by Dr March Rummage.  I was called to see the patient after the patient had recent abnormal ABIs.  Patient states that he does not really know how his second toe became injured but he had some pain just above his right ankle and then a wound appeared on his right second toe.  He has no history of atrial fibrillation.  He has not had any prior procedures on his feet.  He has been on hemodialysis for 8 years.  He has had diabetes for similar amount of time.  He is a non-smoker.  He does not really describe claudication symptoms. Other medical problems include end-stage renal disease Monday Wednesday Friday dialysis, diabetes, hypertension all of which are currently stable.  He dialyzes via right upper arm AV fistula.  Past Medical History:  Diagnosis Date  . Chills    at night - sometimes  . Complication of anesthesia   . Diabetes mellitus    controlled by diet  . 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 Eczema   . Fatigue    loss of fatigue  . Headache(784.0)    Years ago  . Heart murmur    years ago  . History of blood transfusion   . Hypertension    sees Dr. Jaci Standard  . MRSA (methicillin resistant staph aureus) culture positive   . PONV (postoperative nausea and vomiting)   . Poor circulation   . Renal disorder    Past Surgical History:  Procedure Laterality Date  . AV FISTULA PLACEMENT  06-18-11   Right brachiocephalic AVF  . EYE SURGERY     left eye for Laser, diabetic retinopathy  . FISTULOGRAM Right 11/06/2011   Procedure: FISTULOGRAM;  Surgeon: Conrad Nelson, MD;  Location: [facility] CATH LAB;  Service: Cardiovascular;  Laterality: Right;  . HEMATOMA EVACUATION Right Feb. 25, 2014  . HEMATOMA EVACUATION  Right 12/14/2012   Procedure: EVACUATION HEMATOMA;  Surgeon: Conrad [place], MD;  Location: [place];  Service: Vascular;  Laterality: Right;  . INSERTION OF DIALYSIS CATHETER  12/14/2012   Procedure: INSERTION OF DIALYSIS CATHETER;  Surgeon: Conrad Richton, MD;  Location: [place];  Service: Vascular;;  . REVISON OF ARTERIOVENOUS FISTULA Right 12/14/2012   Procedure: REVISON OF upper arm ARTERIOVENOUS FISTULA using 31mmx10cm gortex graft;  Surgeon: Conrad [place], MD;  Location: [place];  Service: Vascular;  Laterality: Right;  . SOFT TISSUE MASS EXCISION     Right arm, Left leg  for MRSA infection    Family History  Problem Relation Age of Onset  . Diabetes Mother     SOCIAL HISTORY: Social History   Socioeconomic History  . Marital status: Married    Spouse name: Not on file  . Number of children: Not on file  . Years of education: Not on file  . Highest education level: Not on file  Occupational History  . Not on file  Social Needs  . Financial resource strain: Not on file  . Food insecurity    Worry: Not on file    Inability: Not on file  . Transportation needs    Medical: Not on file    Non-medical: Not on file  Tobacco Use  .  Smoking status: Never Smoker  . Smokeless tobacco: Never Used  Substance and Sexual Activity  . Alcohol use: No  . Drug use: No  . Sexual activity: Yes  Lifestyle  . Physical activity    Days per week: Not on file    Minutes per session: Not on file  . Stress: Not on file  Relationships  . Social Herbalist on phone: Not on file    Gets together: Not on file    Attends religious service: Not on file    Active member of club or organization: Not on file    Attends meetings of clubs or organizations: Not on file    Relationship status: Not on file  . Intimate partner violence    Fear of current or ex partner: Not on file    Emotionally abused: Not on file    Physically abused: Not on file    Forced sexual activity: Not on file  Other  Topics Concern  . Not on file  Social History Narrative  . Not on file    Allergies  Allergen Reactions  . Amoxicillin Itching    Did it involve swelling of the face/tongue/throat, SOB, or low BP? Unknown Did it involve sudden or severe rash/hives, skin peeling, or any reaction on the inside of your mouth or nose? Unknown Did you need to seek medical attention at a hospital or doctor's office? Unknown When did it last happen?20 years ago If all above answers are "NO", may proceed with cephalosporin use.   Marland Kitchen Hydromorphone Itching    Patient states he may take with benadryl. Makes feet itch.  Marland Kitchen Percocet [Oxycodone-Acetaminophen] Itching and Other (See Comments)    Legs only. Itching and burning feeling.    Current Facility-Administered Medications  Medication Dose Route Frequency Provider Last Rate Last Dose  . 0.9 %  sodium chloride infusion  100 mL Intravenous PRN Loren Racer, PA-C      . 0.9 %  sodium chloride infusion  100 mL Intravenous PRN Loren Racer, PA-C      . acetaminophen (TYLENOL) tablet 650 mg  650 mg Oral Q6H PRN Evelina Bucy, DPM   650 mg at 05/16/19 0919   Or  . acetaminophen (TYLENOL) suppository 650 mg  650 mg Rectal Q6H PRN Evelina Bucy, DPM      . alteplase (CATHFLO ACTIVASE) injection 2 mg  2 mg Intracatheter Once PRN Loren Racer, PA-C      . alum & mag hydroxide-simeth (MAALOX/MYLANTA) 200-200-20 MG/5ML suspension 30 mL  30 mL Oral Q4H PRN Damita Lack, MD   30 mL at 05/15/19 0119  . aspirin EC tablet 81 mg  81 mg Oral Daily Amin, Ankit Chirag, MD      . atorvastatin (LIPITOR) tablet 40 mg  40 mg Oral q1800 Amin, Ankit Chirag, MD      . cefTRIAXone (ROCEPHIN) 1 g in sodium chloride 0.9 % 100 mL IVPB  1 g Intravenous Q24H Evelina Bucy, DPM 200 mL/hr at 05/15/19 2140 1 g at 05/15/19 2140  . Chlorhexidine Gluconate Cloth 2 % PADS 6 each  6 each Topical Q0600 Loren Racer, PA-C   6 each at 05/15/19 1249  .  Darbepoetin Alfa (ARANESP) injection 100 mcg  100 mcg Intravenous Q Mon-HD Loren Racer, PA-C      . diphenhydrAMINE (BENADRYL) capsule 25 mg  25 mg Oral Q6H PRN Evelina Bucy, DPM   25 mg  at 05/15/19 1848  . docusate sodium (COLACE) capsule 100 mg  100 mg Oral BID Amin, Ankit Chirag, MD   100 mg at 05/16/19 0919  . doxercalciferol (HECTOROL) injection 6 mcg  6 mcg Intravenous Q M,W,F-HD Stovall, Woodfin Ganja, PA-C      . feeding supplement (PRO-STAT SUGAR FREE 64) liquid 30 mL  30 mL Oral BID Loren Racer, PA-C   30 mL at 05/16/19 0919  . heparin injection 1,000 Units  1,000 Units Dialysis PRN Loren Racer, PA-C      . heparin injection 5,000 Units  5,000 Units Subcutaneous Q8H Damita Lack, MD   5,000 Units at 05/15/19 1502  . hydrALAZINE (APRESOLINE) injection 5 mg  5 mg Intravenous Q2H PRN Evelina Bucy, DPM      . hydrocortisone (ANUSOL-HC) 2.5 % rectal cream 1 application  1 application Topical QID PRN Amin, Ankit Chirag, MD      . hydrocortisone cream 1 % 1 application  1 application Topical TID PRN Amin, Ankit Chirag, MD      . insulin aspart (novoLOG) injection 0-5 Units  0-5 Units Subcutaneous QHS Evelina Bucy, DPM      . insulin aspart (novoLOG) injection 0-9 Units  0-9 Units Subcutaneous TID WC Evelina Bucy, DPM   2 Units at 05/15/19 [phone]  . lidocaine (PF) (XYLOCAINE) 1 % injection 5 mL  5 mL Intradermal PRN Loren Racer, PA-C      . lidocaine-prilocaine (EMLA) cream 1 application  1 application Topical PRN Stovall, Woodfin Ganja, PA-C      . lip balm (CARMEX) ointment 1 application  1 application Topical PRN Amin, Ankit Chirag, MD      . loratadine (CLARITIN) tablet 10 mg  10 mg Oral Daily PRN Evelina Bucy, DPM      . Melatonin TABS 9 mg  9 mg Oral QHS PRN Evelina Bucy, DPM      . morphine 2 MG/ML injection 2 mg  2 mg Intravenous Q4H PRN Evelina Bucy, DPM   2 mg at 05/16/19 7793  . multivitamin (RENA-VIT) tablet 1 tablet  1 tablet Oral Q  M,W,F-1800 Evelina Bucy, DPM      . Muscle Rub CREA 1 application  1 application Topical PRN Amin, Ankit Chirag, MD      . ondansetron (ZOFRAN) tablet 4 mg  4 mg Oral Q6H PRN Evelina Bucy, DPM       Or  . ondansetron (ZOFRAN) injection 4 mg  4 mg Intravenous Q6H PRN Evelina Bucy, DPM      . pentafluoroprop-tetrafluoroeth (GEBAUERS) aerosol 1 application  1 application Topical PRN Stovall, Woodfin Ganja, PA-C      . phenol (CHLORASEPTIC) mouth spray 1 spray  1 spray Mouth/Throat PRN Amin, Ankit Chirag, MD      . polyethylene glycol (MIRALAX / GLYCOLAX) packet 17 g  17 g Oral Daily PRN Amin, Ankit Chirag, MD      . polyvinyl alcohol (LIQUIFILM TEARS) 1.4 % ophthalmic solution 1 drop  1 drop Both Eyes PRN Amin, Ankit Chirag, MD      . senna-docusate (Senokot-S) tablet 1 tablet  1 tablet Oral QHS PRN Evelina Bucy, DPM      . sevelamer carbonate (RENVELA) powder PACK 2.4 g  2.4 g Oral TID WC Stephania Fragmin R, PA-C   2.4 g at 05/15/19 1248  . sodium chloride (OCEAN) 0.65 % nasal spray 1 spray  1 spray Each Nare  PRN Damita Lack, MD      . vancomycin (VANCOCIN) IVPB 1000 mg/200 mL premix  1,000 mg Intravenous Q M,W,F-HD Nyoka Cowden, Terri L, RPH 200 mL/hr at 05/16/19 1200 1,000 mg at 05/16/19 1200    ROS:   General:  No weight loss, Fever, chills  HEENT: No recent headaches, no nasal bleeding, no visual changes, no sore throat  Neurologic: No dizziness, blackouts, seizures. No recent symptoms of stroke or mini- stroke. No recent episodes of slurred speech, or temporary blindness.  Cardiac: No recent episodes of chest pain/pressure, no shortness of breath at rest.  No shortness of breath with exertion.  Denies history of atrial fibrillation or irregular heartbeat  Vascular: No history of rest pain in feet.  No history of claudication.  No history of non-healing ulcer, No history of DVT   Pulmonary: No home oxygen, no productive cough, no hemoptysis,  No asthma or wheezing   Musculoskeletal:  [ ]  Arthritis, [ ]  Low back pain,  [ ]  Joint pain  Hematologic:No history of hypercoagulable state.  No history of easy bleeding.  No history of anemia  Gastrointestinal: No hematochezia or melena,  No gastroesophageal reflux, no trouble swallowing  Urinary: [X]  chronic Kidney disease, [X]  on HD - [X]  MWF or [ ]  TTHS, [ ]  Burning with urination, [ ]  Frequent urination, [ ]  Difficulty urinating;   Skin: No rashes  Psychological: No history of anxiety,  No history of depression   Physical Examination  Vitals:   05/15/19 1938 05/16/19 0431 05/16/19 0900 05/16/19 1321  BP: (!) 171/87 (!) 141/79 (!) 158/89 (!) 154/88  Pulse: (!) 105 (!) 102 98 97  Resp: 14 14 18 18   Temp: 99.5 F (37.5 C) 99.6 F (37.6 C) 99.1 F (37.3 C) 98.2 F (36.8 C)  TempSrc: Oral Oral Oral Oral  SpO2:  98% 98% 100%  Weight:      Height:        Body mass index is 31 kg/m.  General:  Alert and oriented, no acute distress HEENT: Normal Cardiac: Regular Rate and Rhythm without murmur Skin: No rash, suture lateral aspect right first toe, recent 2nd toe amp with staples all other digits intact Extremity Pulses:  2+ radial, brachial, femoral, 2+ right popliteal absent left popliteal pulse absent dorsalis pedis, posterior tibial pulses bilaterally Musculoskeletal: No deformity or edema other than above  Neurologic: Upper and lower extremity motor 5/5 and symmetric  DATA:  CBC    Component Value Date/Time   WBC 8.6 05/15/2019 0507   RBC 2.68 (L) 05/15/2019 0507   HGB 7.5 (L) 05/15/2019 0507   HCT 23.4 (L) 05/15/2019 0507   PLT 303 05/15/2019 0507   MCV 87.3 05/15/2019 0507   MCH 28.0 05/15/2019 0507   MCHC 32.1 05/15/2019 0507   RDW 16.5 (H) 05/15/2019 0507   LYMPHSABS 1.0 05/13/2019 1928   MONOABS 0.7 05/13/2019 1928   EOSABS 0.0 05/13/2019 1928   BASOSABS 0.0 05/13/2019 1928    BMET    Component Value Date/Time   NA 133 (L) 05/15/2019 0507   K 4.3 05/15/2019 0507   CL 90  (L) 05/15/2019 0507   CO2 27 05/15/2019 0507   GLUCOSE 190 (H) 05/15/2019 0507   BUN 39 (H) 05/15/2019 0507   CREATININE 11.88 (H) 05/15/2019 0507   CALCIUM 8.9 05/15/2019 0507   CALCIUM 7.9 (L) 08/31/2009 1524   GFRNONAA 4 (L) 05/15/2019 0507   GFRAA 5 (L) 05/15/2019 0507    ABI: 1.4 bilaterally  noncompressible calcified nondiagnostic  Duplex right leg triphasic flow all the way to the popliteal artery and biphasic to monophasic flow distal  ASSESSMENT: Patient with recent second toe amputation calcified vessels ABIs nondiagnostic duplex ultrasound suggest vessels are patent but none determining of whether or not he has significant distal tibial disease   PLAN: Abdominal aortogram lower extremity runoff possible intervention tomorrow  N.p.o. after midnight  Consent  Risk benefits possible complications of procedure details were discussed with the patient and his wife by phone today.  She understands he understands they both agree to proceed.   Ruta Hinds, MD [facility] Office: [phone] Pager: [phone]

## 2019-05-16 NOTE — Progress Notes (Signed)
RLE arterial duplex       has been completed. Preliminary results can be found under CV proc through chart review. June Leap, BS, RDMS, RVT

## 2019-05-17 ENCOUNTER — Telehealth (INDEPENDENT_AMBULATORY_CARE_PROVIDER_SITE_OTHER): Payer: Self-pay | Admitting: Podiatry

## 2019-05-17 ENCOUNTER — Encounter (HOSPITAL_COMMUNITY): Payer: Self-pay | Admitting: Vascular Surgery

## 2019-05-17 ENCOUNTER — Encounter (HOSPITAL_COMMUNITY)
Admission: EM | Disposition: A | Discharge status: Home Health | Payer: Self-pay | Source: Home / Self Care | Attending: Internal Medicine

## 2019-05-17 DIAGNOSIS — N186 End stage renal disease: Secondary | ICD-10-CM

## 2019-05-17 DIAGNOSIS — M86171 Other acute osteomyelitis, right ankle and foot: Secondary | ICD-10-CM

## 2019-05-17 DIAGNOSIS — Z992 Dependence on renal dialysis: Secondary | ICD-10-CM

## 2019-05-17 HISTORY — PX: ABDOMINAL AORTOGRAM: CATH118222

## 2019-05-17 HISTORY — PX: LOWER EXTREMITY ANGIOGRAPHY: CATH118251

## 2019-05-17 LAB — CBC
HCT: 28.2 % — ABNORMAL LOW (ref 39.0–52.0)
Hemoglobin: 8.9 g/dL — ABNORMAL LOW (ref 13.0–17.0)
MCH: 27.5 pg (ref 26.0–34.0)
MCHC: 31.6 g/dL (ref 30.0–36.0)
MCV: 87 fL (ref 80.0–100.0)
Platelets: 362 10*3/uL (ref 150–400)
RBC: 3.24 MIL/uL — ABNORMAL LOW (ref 4.22–5.81)
RDW: 16.6 % — ABNORMAL HIGH (ref 11.5–15.5)
WBC: 9.5 10*3/uL (ref 4.0–10.5)
nRBC: 0 % (ref 0.0–0.2)

## 2019-05-17 LAB — AEROBIC/ANAEROBIC CULTURE W GRAM STAIN (SURGICAL/DEEP WOUND)

## 2019-05-17 LAB — COMPREHENSIVE METABOLIC PANEL
ALT: 19 U/L (ref 0–44)
AST: 26 U/L (ref 15–41)
Albumin: 2.6 g/dL — ABNORMAL LOW (ref 3.5–5.0)
Alkaline Phosphatase: 124 U/L (ref 38–126)
Anion gap: 19 — ABNORMAL HIGH (ref 5–15)
BUN: 69 mg/dL — ABNORMAL HIGH (ref 6–20)
CO2: 23 mmol/L (ref 22–32)
Calcium: 9 mg/dL (ref 8.9–10.3)
Chloride: 90 mmol/L — ABNORMAL LOW (ref 98–111)
Creatinine, Ser: 16.62 mg/dL — ABNORMAL HIGH (ref 0.61–1.24)
GFR calc Af Amer: 3 mL/min — ABNORMAL LOW (ref >60)
GFR calc non Af Amer: 3 mL/min — ABNORMAL LOW (ref >60)
Glucose, Bld: 93 mg/dL (ref 70–99)
Potassium: 5.8 mmol/L — ABNORMAL HIGH (ref 3.5–5.1)
Sodium: 132 mmol/L — ABNORMAL LOW (ref 135–145)
Total Bilirubin: 0.9 mg/dL (ref 0.3–1.2)
Total Protein: 6.8 g/dL (ref 6.5–8.1)

## 2019-05-17 LAB — FOLATE RBC
Folate, Hemolysate: 574 ng/mL
Folate, RBC: 2402 ng/mL (ref >498)
Hematocrit: 23.9 % — ABNORMAL LOW (ref 37.5–51.0)

## 2019-05-17 LAB — MAGNESIUM: Magnesium: 2.9 mg/dL — ABNORMAL HIGH (ref 1.7–2.4)

## 2019-05-17 LAB — GLUCOSE, CAPILLARY
Glucose-Capillary: 114 mg/dL — ABNORMAL HIGH (ref 70–99)
Glucose-Capillary: 133 mg/dL — ABNORMAL HIGH (ref 70–99)
Glucose-Capillary: 105 mg/dL — ABNORMAL HIGH (ref 70–99)
Glucose-Capillary: 227 mg/dL — ABNORMAL HIGH (ref 70–99)

## 2019-05-17 SURGERY — LOWER EXTREMITY ANGIOGRAPHY
Anesthesia: LOCAL

## 2019-05-17 MED ORDER — SODIUM CHLORIDE 0.9% FLUSH
3.0000 mL | Freq: Two times a day (BID) | INTRAVENOUS | Status: DC
  Administered 2019-05-17: 3 mL via INTRAVENOUS

## 2019-05-17 MED ORDER — LIDOCAINE HCL (PF) 1 % IJ SOLN
INTRAMUSCULAR | Status: AC
  Filled 2019-05-17: qty 30

## 2019-05-17 MED ORDER — SODIUM CHLORIDE 0.9 % IV SOLN: 250.0000 mL | INTRAVENOUS | Status: DC | PRN

## 2019-05-17 MED ORDER — DIPHENHYDRAMINE HCL 50 MG/ML IJ SOLN
INTRAMUSCULAR | Status: DC | PRN
  Administered 2019-05-17: 25 mg via INTRAVENOUS

## 2019-05-17 MED ORDER — HYDRALAZINE HCL 20 MG/ML IJ SOLN: 5.0000 mg | INTRAMUSCULAR | Status: DC | PRN

## 2019-05-17 MED ORDER — HEPARIN (PORCINE) IN NACL 1000-0.9 UT/500ML-% IV SOLN
INTRAVENOUS | Status: AC
  Filled 2019-05-17: qty 1000

## 2019-05-17 MED ORDER — FENTANYL CITRATE (PF) 100 MCG/2ML IJ SOLN
INTRAMUSCULAR | Status: AC
  Filled 2019-05-17: qty 2

## 2019-05-17 MED ORDER — LABETALOL HCL 5 MG/ML IV SOLN: 10.0000 mg | INTRAVENOUS | Status: DC | PRN

## 2019-05-17 MED ORDER — DIPHENHYDRAMINE HCL 50 MG/ML IJ SOLN
INTRAMUSCULAR | Status: AC
  Filled 2019-05-17: qty 1

## 2019-05-17 MED ORDER — SIMETHICONE 80 MG PO CHEW
160.0000 mg | CHEWABLE_TABLET | Freq: Three times a day (TID) | ORAL | Status: DC | PRN
  Administered 2019-05-17: 160 mg via ORAL
  Filled 2019-05-17: qty 2

## 2019-05-17 MED ORDER — FENTANYL CITRATE (PF) 100 MCG/2ML IJ SOLN
INTRAMUSCULAR | Status: DC | PRN
  Administered 2019-05-17: 25 ug via INTRAVENOUS

## 2019-05-17 MED ORDER — MIDAZOLAM HCL 2 MG/2ML IJ SOLN
INTRAMUSCULAR | Status: DC | PRN
  Administered 2019-05-17: 1 mg via INTRAVENOUS

## 2019-05-17 MED ORDER — LIDOCAINE HCL (PF) 1 % IJ SOLN
INTRAMUSCULAR | Status: DC | PRN
  Administered 2019-05-17: 18 mL

## 2019-05-17 MED ORDER — DIPHENHYDRAMINE HCL 25 MG PO CAPS
ORAL_CAPSULE | ORAL | Status: AC
  Administered 2019-05-17: 25 mg via ORAL
  Filled 2019-05-17: qty 1

## 2019-05-17 MED ORDER — LABETALOL HCL 5 MG/ML IV SOLN
INTRAVENOUS | Status: AC
  Filled 2019-05-17: qty 4

## 2019-05-17 MED ORDER — IODIXANOL 320 MG/ML IV SOLN
INTRAVENOUS | Status: DC | PRN
  Administered 2019-05-17: 155 mL via INTRAVENOUS

## 2019-05-17 MED ORDER — SODIUM CHLORIDE 0.9% FLUSH: 3.0000 mL | INTRAVENOUS | Status: DC | PRN

## 2019-05-17 MED ORDER — MIDAZOLAM HCL 2 MG/2ML IJ SOLN
INTRAMUSCULAR | Status: AC
  Filled 2019-05-17: qty 2

## 2019-05-17 MED ORDER — LABETALOL HCL 5 MG/ML IV SOLN
INTRAVENOUS | Status: DC | PRN
  Administered 2019-05-17: 10 mg via INTRAVENOUS

## 2019-05-17 MED ORDER — HYDROCODONE-ACETAMINOPHEN 10-325 MG PO TABS: 1.0000 | ORAL_TABLET | ORAL | 0 refills | Status: DC | PRN

## 2019-05-17 MED ORDER — HEPARIN (PORCINE) IN NACL 1000-0.9 UT/500ML-% IV SOLN
INTRAVENOUS | Status: DC | PRN
  Administered 2019-05-17 (×2): 500 mL

## 2019-05-17 SURGICAL SUPPLY — 10 items
CATH ANGIO 5F PIGTAIL 65CM (CATHETERS) ×1 IMPLANT
CATH CROSS OVER TEMPO 5F (CATHETERS) ×1 IMPLANT
CATH STRAIGHT 5FR 65CM (CATHETERS) ×1 IMPLANT
GUIDEWIRE ANGLED .035X150CM (WIRE) ×1 IMPLANT
KIT PV (KITS) ×3 IMPLANT
SHEATH PINNACLE 5F 10CM (SHEATH) ×1 IMPLANT
SYR MEDRAD MARK V 150ML (SYRINGE) ×1 IMPLANT
TRANSDUCER W/STOPCOCK (MISCELLANEOUS) ×3 IMPLANT
TRAY PV CATH (CUSTOM PROCEDURE TRAY) ×3 IMPLANT
WIRE HITORQ VERSACORE ST 145CM (WIRE) ×1 IMPLANT

## 2019-05-17 NOTE — Progress Notes (Signed)
[facility] Progress Note  Name: [name] MRN: [id number] DOB: 1966-01-28  Chief Complaint:  Right foot pain   Subjective:  Seen on HD at 4:00 pm.  Procedure supervised.  BP 140/68 and HR 97.  RUE AVF in use.  Tolerating treatment.  He states that he has been eating salads and vegetables and thinks this is why potassium is high.    Patient states that he did not refuse HD yesterday AM but he asked to have a few minutes to get ready yesterday AM.  His treatment was postponed to another shift and ultimately later moved to TTS schedule per inpatient volume.  He didn't take the ordered kayexalate last night because he knew he had treatment today.  He does not want to go HD two days in a row so will not come Wednesday, his regular day.  He thinks he may be discharged tomorrow.  He isn't willing to have treatment here or outpatient tomorrow (if discharged) due to coming today.  He has some kayexalate at home and is willing to take kayexalate 30 gram tomorrow if needed.   Spoke with primary team - he may be discharged tomorrow - they are not sure and narrowing down his antibiotics.     Review of systems:  Denies shortness of breath or chest pain; no n/v  --------- Background on consult:  [name] is a 53 y.o. 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  Pt reports that he has been having R foot pains for nearly 1 mo now. Around the weekend of July 4th, he noted a "blister" on his R 2nd toe. Continued to walk on it and "doctor on it by myself". He was evaluated by podiatry on 7/17 and noted to have R 1st and 2nd toe ulcerations. Xray showed osteolysis of distal 1st/2nd phalanx. This wound was debrided and plan was for home health dressing changes. On 7/24, he returned for f/u appointment. Toe was worse in appearance with odor, and he was noted to be febrile and tachycardic -> he was sent to the ED for  admission and planned amputation. Of note, he did not inform any of his dialysis care team of the above despite being evaluated by MD + PA's multiple times during this time frame and he refused RN monthly foot check - he reports "I didn't want any of the other patients knowing my business" regarding lack of privacy to discuss sensitive issues while on dialysis machine.  In the ED, labs showed Na 137, K 4.2, Ca 9.2, WBC 9.9, Hgb 8.6, CRP 22.6. COVID-19 testing negative. LE u/s negative for DVT. Blood and wound Cx collected and he was started on Vanc and Ceftriaxone.  He underwent partial amputation of R 2nd toe and R great toe bone biopsy on 7/25.  Today, he is having some R foot pain and he is upset with himself that he let it get this bad. Wants to make sure 100% that he has good blood flow to feet, worried about future amputations. Denies CP, dyspnea, N/V, diarrhea, or fever.  Dialyzes on MWF schedule at [facility] center. He is very compliant with his treatments, last HD Fri 7/24. He is due for next HD tomorrow.   Intake/Output Summary (Last 24 hours) at 05/17/2019 1601 Last data filed at 05/16/2019 1829 Gross per 24 hour  Intake 442.2 ml  Output -  Net 442.2  ml    Vitals:  Vitals:   05/17/19 1400 05/17/19 1430 05/17/19 1500 05/17/19 1530  BP: (!) 152/86 (!) 145/84 (!) 149/83 132/78  Pulse: 92 94 94   Resp:      Temp:      TempSrc:      SpO2:      Weight:      Height:         Physical Exam:  General adult male in bed in no acute distress HEENT normocephalic atraumatic extraocular movements intact sclera anicteric Neck supple trachea midline Lungs clear to auscultation bilaterally normal work of breathing at rest  Heart tachycardia; regular; no rub Abdomen soft nontender nondistended Extremities no lower extremity edema  Psych normal mood and affect Access RUE AVF in use    Medications reviewed   Labs:  BMP Latest Ref Rng & Units 05/17/2019 05/16/2019 05/15/2019  Glucose 70  - 99 mg/dL 93 108(H) 190(H)  BUN 6 - 20 mg/dL 69(H) 60(H) 39(H)  Creatinine 0.61 - 1.24 mg/dL 16.62(H) 15.37(H) 11.88(H)  Sodium 135 - 145 mmol/L 132(L) 134(L) 133(L)  Potassium 3.5 - 5.1 mmol/L 5.8(H) 5.2(H) 4.3  Chloride 98 - 111 mmol/L 90(L) 89(L) 90(L)  CO2 22 - 32 mmol/L 23 27 27   Calcium 8.9 - 10.3 mg/dL 9.0 8.9 8.9   Dialysis Orders:  MWF at [facility] 4hr, 450/800, EDW 98kg, 2K/2.25Ca, UFP #3, AVF, heparin 2000 bolus - Hectoral 80mcg IV q HD - Mircera 52mcg IV q 2 weeks (last 7/15)   Assessment/Plan:   1.  R 1st/2nd toe osteomyelitis: S/p partial 2nd toe amputation and great toe bone Bx 7/25 - pending. On empiric Vanc/Ceftriaxone.  Spoke with primary team and they are narrowing his antibiotics.  Please transition to alternate pain regimen (instead of morphine) given his ESRD - he will accumulate metabolites from same  2.  ESRD:  Normally MWF schedule but missed treatment yesterday ultimately 2/2 unit volume.  HD today.  Will plan for additional HD on 7/30 if he remains inpatient.  He is not willing to dialyze here tomorrow but is willing to go to his home unit if he is to be discharged.  Holding heparin.   3. Hyperkalemia - HD today, 7/28.  Refused kayexalate yesterday  4.  Hypertension/volume:  continue home EDW for now. 5.  Anemia: chronic disease and acute blood loss.  Aranesp is ordered. 6.  Secondary hyperparathyroidism calcium acceptable; hyperphos  Auryxia on med list - says giving him constipation, requested to change to Renvela pwdr which is fine - ordered.  7.  Nutrition: Alb low, on pro-stat supplements. 8.  Type 2 DM: Insulin per primary. 9.  Hyperlipidemia: Consider statin in future.   Claudia Desanctis, MD 05/17/2019 4:01 PM

## 2019-05-17 NOTE — Progress Notes (Signed)
Site area: Left groin a 5 french arterial sheath was removed  Site Prior to Removal:  Level 0  Pressure Applied For 20 MINUTES    Bedrest Beginning at 0900am  Manual:   Yes.    Patient Status During Pull:  stable  Post Pull Groin Site:  Level 0  Post Pull Instructions Given:  Yes.    Post Pull Pulses Present:  Yes.    Dressing Applied:  Yes.    Comments:  VS remain stable

## 2019-05-17 NOTE — Interval H&P Note (Signed)
History and Physical Interval Note:  05/17/2019 7:31 AM  [name]  has presented today for surgery, with the diagnosis of pvd.  The various methods of treatment have been discussed with the patient and family. After consideration of risks, benefits and other options for treatment, the patient has consented to  Procedure(s): LOWER EXTREMITY ANGIOGRAPHY (N/A) as a surgical intervention.  The patient's history has been reviewed, patient examined, no change in status, stable for surgery.  I have reviewed the patient's chart and labs.  Questions were answered to the patient's satisfaction.     Ruta Hinds

## 2019-05-17 NOTE — Progress Notes (Signed)
Pt picked up by cath lab. Hemodialysis postponed until after aortogram. Consent signed. Pt left floor on stretcher.

## 2019-05-17 NOTE — Progress Notes (Signed)
Pt refused heparin x2.  Educated about the purpose of blood thinners.  MD notified.

## 2019-05-17 NOTE — Progress Notes (Signed)
Renal Navigator notified by Nephrologist/Dr. Royce Macadamia that per Primary, patient may be discharged tomorrow, 05/18/19. Renal Navigator has arranged appointment at patient's [facility] HD clinic[facility] at 12:55pm. He needs to arrive at 12:45pm for treatment.  Renal Navigator will follow up in the am of 05/18/19 to confirm and assist with transportation arrangements as needed.  Alphonzo Cruise, [place] Renal Navigator  [phone]

## 2019-05-17 NOTE — Progress Notes (Signed)
Pt received from HD. VSS. LG level 0. CHG complete. Pt oriented to room and unit. Call light in reach. Will continue to monitor.  Clyde Canterbury, RN

## 2019-05-17 NOTE — Telephone Encounter (Signed)
Rx for Norco 10 sent to patient's pharmacy.

## 2019-05-17 NOTE — Progress Notes (Signed)
Subjective:  Patient ID: [name], male    DOB: 1966-07-20,  MRN: [id number]  Atttempted to see this AM was off floor for procedure. Seen this evening doing ok having gas again but pain controlled to foot. Had vascular study today told he only has one vessel supplying blood to his foot. Objective:   Vitals:   05/17/19 1814 05/17/19 2004  BP: 135/72 (!) 147/78  Pulse: (!) 110 (!) 112  Resp: 20 17  Temp: 98.8 F (37.1 C) 99.4 F (37.4 C)  SpO2: 97% 95%   General AA&O x3. Normal mood and affect.  Vascular Right foot warm and well perfused.  Neurologic Epicritic sensation grossly intact.  Dermatologic Surgical wound appears to be healing without necrosis. No warmth, erythema, signs of acute infection.  Orthopedic: MMT 5/5 in dorsiflexion, plantarflexion, inversion, and eversion. Normal joint ROM without pain or crepitus.   Results for orders placed or performed during the hospital encounter of 05/13/19  SARS Coronavirus 2 (CEPHEID - Performed in [place] hospital lab), Hosp Order     Status: None   Collection Time: 05/14/19  2:32 AM   Specimen: Nasopharyngeal Swab  Result Value Ref Range Status   SARS Coronavirus 2 NEGATIVE NEGATIVE Final    Comment: (NOTE) If result is NEGATIVE SARS-CoV-2 target nucleic acids are NOT DETECTED. The SARS-CoV-2 RNA is generally detectable in upper and lower  respiratory specimens during the acute phase of infection. The lowest  concentration of SARS-CoV-2 viral copies this assay can detect is 250  copies / mL. A negative result does not preclude SARS-CoV-2 infection  and should not be used as the sole basis for treatment or other  patient management decisions.  A negative result may occur with  improper specimen collection / handling, submission of specimen other  than nasopharyngeal swab, presence of viral mutation(s) within the  areas targeted by this assay, and inadequate number of viral copies  (<250 copies / mL). A negative result must  be combined with clinical  observations, patient history, and epidemiological information. If result is POSITIVE SARS-CoV-2 target nucleic acids are DETECTED. The SARS-CoV-2 RNA is generally detectable in upper and lower  respiratory specimens dur ing the acute phase of infection.  Positive  results are indicative of active infection with SARS-CoV-2.  Clinical  correlation with patient history and other diagnostic information is  necessary to determine patient infection status.  Positive results do  not rule out bacterial infection or co-infection with other viruses. If result is PRESUMPTIVE POSTIVE SARS-CoV-2 nucleic acids MAY BE PRESENT.   A presumptive positive result was obtained on the submitted specimen  and confirmed on repeat testing.  While 2019 novel coronavirus  (SARS-CoV-2) nucleic acids may be present in the submitted sample  additional confirmatory testing may be necessary for epidemiological  and / or clinical management purposes  to differentiate between  SARS-CoV-2 and other Sarbecovirus currently known to infect humans.  If clinically indicated additional testing with an alternate test  methodology [phone]) is advised. The SARS-CoV-2 RNA is generally  detectable in upper and lower respiratory sp ecimens during the acute  phase of infection. The expected result is Negative. Fact Sheet for Patients:  StrictlyIdeas.no Fact Sheet for Healthcare Providers: BankingDealers.co.za This test is not yet approved or cleared by the [place] FDA and has been authorized for detection and/or diagnosis of SARS-CoV-2 by FDA under an Emergency Use Authorization (EUA).  This EUA will remain in effect (meaning this test can be used) for the  duration of the COVID-19 declaration under Section 564(b)(1) of the Act, 21 U.S.C. section 360bbb-3(b)(1), unless the authorization is terminated or revoked sooner. Performed at [facility], [place] [address], [place], [place] [address]   Blood culture (routine x 2)     Status: None (Preliminary result)   Collection Time: 05/14/19  2:48 AM   Specimen: BLOOD  Result Value Ref Range Status   Specimen Description BLOOD LEFT ARM  Final   Special Requests   Final    BOTTLES DRAWN AEROBIC AND ANAEROBIC Blood Culture results may not be optimal due to an inadequate volume of blood received in culture bottles   Culture   Final    NO GROWTH 3 DAYS Performed at [facility], [place] [address], [place], [place] [address]    Report Status PENDING  Incomplete  Blood culture (routine x 2)     Status: None (Preliminary result)   Collection Time: 05/14/19  2:55 AM   Specimen: BLOOD LEFT HAND  Result Value Ref Range Status   Specimen Description BLOOD LEFT HAND  Final   Special Requests   Final    BOTTLES DRAWN AEROBIC AND ANAEROBIC Blood Culture adequate volume   Culture   Final    NO GROWTH 3 DAYS Performed at [facility], [name] [address] [place], [place] [address]    Report Status PENDING  Incomplete  MRSA PCR Screening     Status: None   Collection Time: 05/14/19  5:29 AM   Specimen: Nasal Mucosa; Nasopharyngeal  Result Value Ref Range Status   MRSA by PCR NEGATIVE NEGATIVE Final    Comment:        The GeneXpert MRSA Assay (FDA approved for NASAL specimens only), is one component of a comprehensive MRSA colonization surveillance program. It is not intended to diagnose MRSA infection nor to guide or monitor treatment for MRSA infections. Performed at [facility], [place] [address], [place], [place] [address]   Aerobic/Anaerobic Culture (surgical/deep wound)     Status: None (Preliminary result)   Collection Time: 05/14/19 11:20 AM   Specimen: Bone; Tissue  Result Value Ref Range Status   Specimen Description BONE 1ST DISTAL PHALANX  Final   Special Requests NONE  Final   Gram Stain   Final    RARE WBC PRESENT, PREDOMINANTLY PMN NO ORGANISMS SEEN     Culture   Final    NO GROWTH 3 DAYS NO ANAEROBES ISOLATED; CULTURE IN PROGRESS FOR 5 DAYS Performed at [facility], [place] [address], [place], [place] [address]    Report Status PENDING  Incomplete  Aerobic/Anaerobic Culture (surgical/deep wound)     Status: None   Collection Time: 05/14/19 11:21 AM   Specimen: Soft Tissue, Other  Result Value Ref Range Status   Specimen Description TISSUE RIGHT TOE 2ND  Final   Special Requests NONE  Final   Gram Stain   Final    RARE WBC PRESENT, PREDOMINANTLY PMN RARE GRAM NEGATIVE RODS    Culture   Final    RARE ESCHERICHIA COLI RARE HAEMOPHILUS PARAINFLUENZAE BETA LACTAMASE NEGATIVE FEW BACTEROIDES VULGATUS BETA LACTAMASE POSITIVE Performed at [facility], [place] [address] [place], [place] [address]    Report Status 05/17/2019 FINAL  Final   Organism ID, Bacteria ESCHERICHIA COLI  Final      Susceptibility   Escherichia coli - MIC*    AMPICILLIN 8 SENSITIVE Sensitive     CEFAZOLIN <=4  SENSITIVE Sensitive     CEFEPIME <=1 SENSITIVE Sensitive     CEFTAZIDIME <=1 SENSITIVE Sensitive     CEFTRIAXONE <=1 SENSITIVE Sensitive     CIPROFLOXACIN <=0.25 SENSITIVE Sensitive     GENTAMICIN <=1 SENSITIVE Sensitive     IMIPENEM <=0.25 SENSITIVE Sensitive     TRIMETH/SULFA <=20 SENSITIVE Sensitive     AMPICILLIN/SULBACTAM 4 SENSITIVE Sensitive     PIP/TAZO <=4 SENSITIVE Sensitive     Extended ESBL NEGATIVE Sensitive     * RARE ESCHERICHIA COLI    Results for orders placed or performed during the hospital encounter of 05/13/19 (from the past 24 hour(s))  CBC     Status: Abnormal   Collection Time: 05/17/19  3:53 AM  Result Value Ref Range   WBC 9.5 4.0 - 10.5 K/uL   RBC 3.24 (L) 4.22 - 5.81 MIL/uL   Hemoglobin 8.9 (L) 13.0 - 17.0 g/dL   HCT 28.2 (L) 39.0 - 52.0 %   MCV 87.0 80.0 - 100.0 fL   MCH 27.5 26.0 - 34.0 pg   MCHC 31.6 30.0 - 36.0 g/dL   RDW 16.6 (H) 11.5 - 15.5 %   Platelets 362 150 - 400 K/uL   nRBC 0.0 0.0 - 0.2 %   Magnesium     Status: Abnormal   Collection Time: 05/17/19  3:53 AM  Result Value Ref Range   Magnesium 2.9 (H) 1.7 - 2.4 mg/dL  Comprehensive metabolic panel     Status: Abnormal   Collection Time: 05/17/19  3:53 AM  Result Value Ref Range   Sodium 132 (L) 135 - 145 mmol/L   Potassium 5.8 (H) 3.5 - 5.1 mmol/L   Chloride 90 (L) 98 - 111 mmol/L   CO2 23 22 - 32 mmol/L   Glucose, Bld 93 70 - 99 mg/dL   BUN 69 (H) 6 - 20 mg/dL   Creatinine, Ser 16.62 (H) 0.61 - 1.24 mg/dL   Calcium 9.0 8.9 - 10.3 mg/dL   Total Protein 6.8 6.5 - 8.1 g/dL   Albumin 2.6 (L) 3.5 - 5.0 g/dL   AST 26 15 - 41 U/L   ALT 19 0 - 44 U/L   Alkaline Phosphatase 124 38 - 126 U/L   Total Bilirubin 0.9 0.3 - 1.2 mg/dL   GFR calc non Af Amer 3 (L) >60 mL/min   GFR calc Af Amer 3 (L) >60 mL/min   Anion gap 19 (H) 5 - 15  Glucose, capillary     Status: Abnormal   Collection Time: 05/17/19  8:49 AM  Result Value Ref Range   Glucose-Capillary 114 (H) 70 - 99 mg/dL  Glucose, capillary     Status: Abnormal   Collection Time: 05/17/19  1:00 PM  Result Value Ref Range   Glucose-Capillary 133 (H) 70 - 99 mg/dL  Glucose, capillary     Status: Abnormal   Collection Time: 05/17/19  6:41 PM  Result Value Ref Range   Glucose-Capillary 105 (H) 70 - 99 mg/dL  Glucose, capillary     Status: Abnormal   Collection Time: 05/17/19  9:42 PM  Result Value Ref Range   Glucose-Capillary 227 (H) 70 - 99 mg/dL   Comment 1 Notify RN    Comment 2 Document in Chart     Assessment & Plan:  Patient was evaluated and treated and all questions answered.  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 Pan sensitive E coli, H parainfluenzae soft tissue 2nd toe.  No growth distal phalanx of hallux. -Path pending.  -Continue Empiric Abx. Await cultures. 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 Will likely need extended IV abx with HD for distal phalanx OM depending  upon culture.  -Vascular status reviewed - one vessel runoff bilat. No plan for further intervention.  -Continue dressing changes per nursing. -WBAT in boot -Recommend ID consult for Abx regimen -F/u in the office this week for eval.  Podiatry to sign off. Please contact with questions or concerns.  Evelina Bucy, DPM  Accessible via secure chat for questions or concerns.

## 2019-05-17 NOTE — Op Note (Signed)
Procedure: Abdominal aortogram with bilateral lower extremity runoff, ultrasound left groin  Preoperative diagnosis: Nonhealing wound right foot  Postoperative diagnosis: Same  Anesthesia: Local with IV sedation  Operative findings: #1 one-vessel posterior tibial artery runoff bilateral foot  2.  Left femoral access 5 French sheath  Operative details: After team informed consent, the patient taken to the [place] lab.  The patient is placed in supine position Angie table.  Both groins were prepped and draped in usual sterile fashion.  Ultrasound was used to identify the left common femoral artery and femoral bifurcation.  These were patent.  Image was obtained for the patient's record.  Local anesthesia was infiltrated over the left common femoral artery.  Introducer needle was used to cannulate the left common femoral artery and an 035 versa core wire threaded up the abdominal aorta under fluoroscopic guidance.  Next 5 French sheath placed over the guidewire and left common femoral artery.  This was thoroughly flushed with heparinized saline.  5 French pigtail catheter was then advanced over the guidewire to the level of the renal arteries.  Abdominal aortogram was obtained in AP projection.  Left and right renal arteries are patent.  Infrarenal abdominal aorta is patent.  Left and right internal and external common iliac arteries are all widely patent.  At this point pigtail catheter was pulled down despite the aortic bifurcation.  Bilateral lower extremity runoff views were obtained through the pigtail catheter.  In the left lower extremity, the left common femoral profunda femoris and superficial femoral and popliteal arteries are all widely patent.  There is moderate calcification.  The anterior tibial artery is occluded just after its origin.  The tibioperoneal trunk is patent there is a stenosis at the origin of the posterior tibial and peroneal arteries.  The peroneal artery occludes in the mid  leg.  The posterior tibial artery is the only runoff vessel to the left foot.  In the right lower extremity the right common femoral profundofemoral superficial femoral-popliteal arteries are all widely patent.  Due to contrast timing initially there was not good opacification of all the tibial vessels in the right leg.  Therefore the pigtail catheter was removed and the versa core wire was placed through a 5 [place] crossover catheter to selectively catheterize first the right common external and then superficial femoral artery.  This was done with 035 angled Glidewire.  The crossover catheter was removed in place with a 5 French straight catheter and parked in the mid right superficial femoral artery.  Right lower extremity runoff views including lateral foot view were then performed.  The right anterior tibial artery is occluded 5 cm after its origin.  The tibioperoneal trunk is patent.  The peroneal artery occludes in the mid leg.  The posterior tibial artery is the single runoff vessel to the right foot.  There is an 80% stenosis extending over a very short distance of about 8 mm which is well collateralized in the posterior tibial artery mid leg.  I did not feel that this had flow-limiting significance that would change the amount of blood flow to the patient's right foot.  At this point the 5 French straight catheter was removed.  The 5 French sheath was left in place to be pulled in the holding area.  The patient tolerated procedure well and there were no complications.  The patient was taken the holding area in stable condition.  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  I do not believe angioplasty of the short 8 mm length segment in the posterior tibial artery would have significant flow benefits to the right foot.  Would continue IV antibiotics for  wound healing of his osteomyelitis.  If this fails to heal most likely he will need a below-knee amputation.   Ruta Hinds, MD [facility] Office: [phone] Pager: [phone]

## 2019-05-17 NOTE — Progress Notes (Signed)
Dressing change on pt's right foot, second toe. Applied aquacel, gauze between toes, wrapped first with kerlix, then with coban. Pt tolerated procedure well.

## 2019-05-17 NOTE — Progress Notes (Signed)
PROGRESS NOTE    [name]  GHW:[id number] DOB: [birth year] DOA: 05/13/2019 PCP: Hayden Rasmussen, MD   Brief Narrative:  53 year old with essential hypertension, hyperlipidemia, diabetes mellitus type 2, ESRD on Monday Wednesday Friday hemodialysis, iron deficiency anemia came to the hospital with complains of right lower foot pain diagnosed with osteomyelitis.  Underwent right great toe bone biopsy and amputation of interphalangeal joint on 7/25.  Right lower extremity Dopplers was negative for DVT.  IV antibiotics were continued. Assessment & Plan   Right foot osteomyelitis  -Presented with right foot pain and diagnosed with osteomyelitis -Blood culture showed no growth to date -Podiatry consulted and appreciated, status post partial second toe amputation with bone biopsy.  Weightbearing as tolerated -Culture showing rare E. coli, rare Haemophilus parainfluenza, beta-lactamase negative and negative, few bacteroids vulgatus-pansensitive -Hemoglobin A1c 8, LDL 141 -Patient placed on IV vancomycin and Rocephin -Continue pain control -ABI: Right indicates noncompressible right lower extremity arteries.  Left noncompressible left lower extremity arteries.  Left toe brachial index is abnormal.  Peripheral vascular disease -Lower extremity Dopplers: Right no evidence of DVT however portions of examination were limited.  Left no evidence of common femoral vein obstruction. -ABI as above -Right vascular lower extremity arterial duplex: Calcification of pedal arteries with dampened flow.  Absent peroneal artery flow.  No focal stenosis or occlusion. -Continue aspirin, statin -Vascular surgery consulted and appreciated.  Status post one-vessel posterior tibial artery runoff bilateral foot.  Vascular surgery recommended continuation of IV antibiotics for wound healing of his osteomyelitis.  If this fails, patient will likely need below-knee amputation  Hyperlipidemia -Lipid panel total  cholesterol 231, HDL 43, LDL 141, triglycerides 236 -Continue statin  ESRD  -Patient dialyzes Monday, Wednesday, Friday -Nephrology consulted and appreciated  Essential hypertension -Does not appear to be any home medications.  Anemia of chronic disease -Hemoglobin currently stable, 8.9 -Anemia panel shows iron of 18, ferritin 1270, saturation ratio of 17, folate 2402, vitamin B12 1094 -TSH 1.013 -Continue to monitor CBC  Diabetes mellitus type 2 -Hemoglobin A1c 8 -Continue insulin sliding scale CBG monitoring -Diabetes coordinator consulted and appreciated  DVT Prophylaxis  heparin  Code Status: Full  Family Communication: None at bedside  Disposition Plan: Admitted. Pending further recommendations from vascular and podiatry services.  Continue IV antibiotics.  Pending culture sensitivities.  Consultants Podiatry Vascular surgery  Procedures  Right second toe amputation on 05/14/2019 Lower extremity Doppler ABI Right lower extremity arterial duplex One-vessel posterior tibial artery runoff bilateral foot  Antibiotics   Anti-infectives (From admission, onward)   Start     Dose/Rate Route Frequency Ordered Stop   05/18/19 1200  vancomycin (VANCOCIN) IVPB 1000 mg/200 mL premix     1,000 mg 200 mL/hr over 60 Minutes Intravenous Every M-W-F (Hemodialysis) 05/16/19 1459     05/16/19 1200  vancomycin (VANCOCIN) IVPB 1000 mg/200 mL premix  Status:  Discontinued     1,000 mg 200 mL/hr over 60 Minutes Intravenous Every M-W-F (Hemodialysis) 05/15/19 1331 05/16/19 1459   05/14/19 2200  cefTRIAXone (ROCEPHIN) 1 g in sodium chloride 0.9 % 100 mL IVPB     1 g 200 mL/hr over 30 Minutes Intravenous Every 24 hours 05/14/19 0505     05/14/19 1053  vancomycin (VANCOCIN) powder  Status:  Discontinued       As needed 05/14/19 1057 05/14/19 1115   05/14/19 0230  cefTRIAXone (ROCEPHIN) 1 g in sodium chloride 0.9 % 100 mL IVPB     1 g 200 mL/hr  over 30 Minutes Intravenous  Once  05/14/19 0223 05/14/19 0351   05/14/19 0230  vancomycin (VANCOCIN) 2,000 mg in sodium chloride 0.9 % 500 mL IVPB     2,000 mg 250 mL/hr over 120 Minutes Intravenous  Once 05/14/19 0226 05/14/19 6712      Subjective:   [name] seen and examined today.  Patient seen in hemodialysis. Denies current chest pain, shortness of breath, abdominal pain, nausea, vomiting, dizziness or headache. Had some swelling in his groin after his procedure today.   Objective:   Vitals:   05/17/19 1400 05/17/19 1430 05/17/19 1500 05/17/19 1530  BP: (!) 152/86 (!) 145/84 (!) 149/83 132/78  Pulse: 92 94 94   Resp:      Temp:      TempSrc:      SpO2:      Weight:      Height:        Intake/Output Summary (Last 24 hours) at 05/17/2019 1605 Last data filed at 05/16/2019 1829 Gross per 24 hour  Intake 442.2 ml  Output --  Net 442.2 ml   Filed Weights   05/13/19 1905  Weight: 98 kg    Exam  General: Well developed, well nourished, NAD, appears stated age  HEENT: NCAT, mucous membranes moist.   Cardiovascular: S1 S2 auscultated, RRR, no murmur  Respiratory: Clear to auscultation bilaterally with equal chest rise  Abdomen: Soft, nontender, nondistended, + bowel sounds  Extremities: warm dry without cyanosis clubbing or edema. RLE dressing in place  Neuro: AAOx3, nonfocal  Psych: Normal affect and demeanor    Data Reviewed: I have personally reviewed following labs and imaging studies  CBC: Recent Labs  Lab 05/13/19 1928 05/14/19 0736 05/14/19 0943 05/15/19 0507 05/16/19 1522 05/17/19 0353  WBC 9.9 9.4  --  8.6 9.7 9.5  NEUTROABS 8.0*  --   --   --   --   --   HGB 8.6* 9.7* 9.9* 7.5* 8.1* 8.9*  HCT 28.7* 32.1* 29.0* 23.4* 25.3*   23.9* 28.2*  MCV 92.9 91.2  --  87.3 86.6 87.0  PLT 354 367  --  303 389 458   Basic Metabolic Panel: Recent Labs  Lab 05/13/19 1928 05/14/19 0943 05/15/19 0507 05/16/19 1522 05/17/19 0353  NA 137 134* 133* 134* 132*  K 4.2 4.3 4.3 5.2*  5.8*  CL 92*  --  90* 89* 90*  CO2 31  --  27 27 23   GLUCOSE 136* 117* 190* 108* 93  BUN 17  --  39* 60* 69*  CREATININE 7.69*  --  11.88* 15.37* 16.62*  CALCIUM 9.2  --  8.9 8.9 9.0  MG  --   --  2.6* 2.7* 2.9*  PHOS  --   --   --  7.5*  --    GFR: Estimated Creatinine Clearance: 6 mL/min (A) (by C-G formula based on SCr of 16.62 mg/dL (H)). Liver Function Tests: Recent Labs  Lab 05/13/19 1928 05/15/19 0507 05/16/19 1522 05/17/19 0353  AST 14* 11* 21 26  ALT 14 13 18 19   ALKPHOS 86 80 132* 124  BILITOT 0.6 0.6 0.8 0.9  PROT 7.7 6.1* 6.0* 6.8  ALBUMIN 3.1* 2.4* 2.7* 2.6*   No results for input(s): LIPASE, AMYLASE in the last 168 hours. No results for input(s): AMMONIA in the last 168 hours. Coagulation Profile: No results for input(s): INR, PROTIME in the last 168 hours. Cardiac Enzymes: No results for input(s): CKTOTAL, CKMB, CKMBINDEX, TROPONINI in the last  168 hours. BNP (last 3 results) No results for input(s): PROBNP in the last 8760 hours. HbA1C: Recent Labs    05/15/19 0931  HGBA1C 8.0*   CBG: Recent Labs  Lab 05/16/19 1146 05/16/19 1623 05/16/19 2034 05/17/19 0849 05/17/19 1300  GLUCAP 91 108* 106* 114* 133*   Lipid Profile: Recent Labs    05/15/19 0931  CHOL 231*  HDL 43  LDLCALC 141*  TRIG 236*  CHOLHDL 5.4   Thyroid Function Tests: Recent Labs    05/16/19 1521  TSH 1.013   Anemia Panel: Recent Labs    05/16/19 1522  VITAMINB12 1,094*  FERRITIN 1,270*  TIBC 104*  IRON 18*   Urine analysis:    Component Value Date/Time   COLORURINE YELLOW 06/06/2011 2300   APPEARANCEUR CLOUDY (A) 06/06/2011 2300   LABSPEC 1.021 06/06/2011 2300   PHURINE 5.0 06/06/2011 2300   GLUCOSEU NEGATIVE 06/06/2011 2300   GLUCOSEU > or = 1000 mg/dL (AA) 05/16/2008 1215   HGBUR SMALL (A) 06/06/2011 2300   HGBUR moderate 11/01/2007 0931   BILIRUBINUR NEGATIVE 06/06/2011 2300   KETONESUR NEGATIVE 06/06/2011 2300   PROTEINUR >300 (A) 06/06/2011 2300    UROBILINOGEN 0.2 06/06/2011 2300   NITRITE NEGATIVE 06/06/2011 2300   LEUKOCYTESUR SMALL (A) 06/06/2011 2300   Sepsis Labs: @LABRCNTIP (procalcitonin:4,lacticidven:4)  ) Recent Results (from the past 240 hour(s))  SARS Coronavirus 2 (Performed in [place] hospital lab)     Status: None   Collection Time: 05/13/19  1:00 PM   Specimen: Nasal Swab  Result Value Ref Range Status   SARS Coronavirus 2 NEGATIVE NEGATIVE Final    Comment: (NOTE) SARS-CoV-2 target nucleic acids are NOT DETECTED. The SARS-CoV-2 RNA is generally detectable in upper and lower respiratory specimens during the acute phase of infection. Negative results do not preclude SARS-CoV-2 infection, do not rule out co-infections with other pathogens, and should not be used as the sole basis for treatment or other patient management decisions. Negative results must be combined with clinical observations, patient history, and epidemiological information. The expected result is Negative. Fact Sheet for Patients: SugarRoll.be Fact Sheet for Healthcare Providers: https://www.woods-mathews.com/ This test is not yet approved or cleared by the [place] FDA and  has been authorized for detection and/or diagnosis of SARS-CoV-2 by FDA under an Emergency Use Authorization (EUA). This EUA will remain  in effect (meaning this test can be used) for the duration of the COVID-19 declaration under Section 56 4(b)(1) of the Act, 21 U.S.C. section 360bbb-3(b)(1), unless the authorization is terminated or revoked sooner. Performed at [facility], [place] [address] [place], [place] [address]   SARS Coronavirus 2 (CEPHEID - Performed in [place] hospital lab), Hosp Order     Status: None   Collection Time: 05/14/19  2:32 AM   Specimen: Nasopharyngeal Swab  Result Value Ref Range Status   SARS Coronavirus 2 NEGATIVE NEGATIVE Final    Comment: (NOTE) If result is  NEGATIVE SARS-CoV-2 target nucleic acids are NOT DETECTED. The SARS-CoV-2 RNA is generally detectable in upper and lower  respiratory specimens during the acute phase of infection. The lowest  concentration of SARS-CoV-2 viral copies this assay can detect is 250  copies / mL. A negative result does not preclude SARS-CoV-2 infection  and should not be used as the sole basis for treatment or other  patient management decisions.  A negative result may occur with  improper specimen collection / handling, submission of specimen other  than nasopharyngeal swab, presence  of viral mutation(s) within the  areas targeted by this assay, and inadequate number of viral copies  (<250 copies / mL). A negative result must be combined with clinical  observations, patient history, and epidemiological information. If result is POSITIVE SARS-CoV-2 target nucleic acids are DETECTED. The SARS-CoV-2 RNA is generally detectable in upper and lower  respiratory specimens dur ing the acute phase of infection.  Positive  results are indicative of active infection with SARS-CoV-2.  Clinical  correlation with patient history and other diagnostic information is  necessary to determine patient infection status.  Positive results do  not rule out bacterial infection or co-infection with other viruses. If result is PRESUMPTIVE POSTIVE SARS-CoV-2 nucleic acids MAY BE PRESENT.   A presumptive positive result was obtained on the submitted specimen  and confirmed on repeat testing.  While 2019 novel coronavirus  (SARS-CoV-2) nucleic acids may be present in the submitted sample  additional confirmatory testing may be necessary for epidemiological  and / or clinical management purposes  to differentiate between  SARS-CoV-2 and other Sarbecovirus currently known to infect humans.  If clinically indicated additional testing with an alternate test  methodology [phone]) is advised. The SARS-CoV-2 RNA is generally  detectable  in upper and lower respiratory sp ecimens during the acute  phase of infection. The expected result is Negative. Fact Sheet for Patients:  StrictlyIdeas.no Fact Sheet for Healthcare Providers: BankingDealers.co.za This test is not yet approved or cleared by the [place] FDA and has been authorized for detection and/or diagnosis of SARS-CoV-2 by FDA under an Emergency Use Authorization (EUA).  This EUA will remain in effect (meaning this test can be used) for the duration of the COVID-19 declaration under Section 564(b)(1) of the Act, 21 U.S.C. section 360bbb-3(b)(1), unless the authorization is terminated or revoked sooner. Performed at [facility], [place] [address] [place], [place] [address]   Blood culture (routine x 2)     Status: None (Preliminary result)   Collection Time: 05/14/19  2:48 AM   Specimen: BLOOD  Result Value Ref Range Status   Specimen Description BLOOD LEFT ARM  Final   Special Requests   Final    BOTTLES DRAWN AEROBIC AND ANAEROBIC Blood Culture results may not be optimal due to an inadequate volume of blood received in culture bottles   Culture   Final    NO GROWTH 3 DAYS Performed at [facility], [place] [address] [place], [place] [address]    Report Status PENDING  Incomplete  Blood culture (routine x 2)     Status: None (Preliminary result)   Collection Time: 05/14/19  2:55 AM   Specimen: BLOOD LEFT HAND  Result Value Ref Range Status   Specimen Description BLOOD LEFT HAND  Final   Special Requests   Final    BOTTLES DRAWN AEROBIC AND ANAEROBIC Blood Culture adequate volume   Culture   Final    NO GROWTH 3 DAYS Performed at [facility], [place] [address], [place],  [address]    Report Status PENDING  Incomplete  MRSA PCR Screening     Status: None   Collection Time: 05/14/19  5:29 AM   Specimen: Nasal Mucosa; Nasopharyngeal  Result Value Ref Range Status   MRSA by PCR  NEGATIVE NEGATIVE Final    Comment:        The GeneXpert MRSA Assay (FDA approved for NASAL specimens only), is one component of a comprehensive MRSA colonization surveillance program. It is  not intended to diagnose MRSA infection nor to guide or monitor treatment for MRSA infections. Performed at [facility], [place] [address] [place], [place] [address]   Aerobic/Anaerobic Culture (surgical/deep wound)     Status: None (Preliminary result)   Collection Time: 05/14/19 11:20 AM   Specimen: Bone; Tissue  Result Value Ref Range Status   Specimen Description BONE 1ST DISTAL PHALANX  Final   Special Requests NONE  Final   Gram Stain   Final    RARE WBC PRESENT, PREDOMINANTLY PMN NO ORGANISMS SEEN    Culture   Final    NO GROWTH 3 DAYS NO ANAEROBES ISOLATED; CULTURE IN PROGRESS FOR 5 DAYS Performed at [facility], [place] [address], [place], [place] [address]    Report Status PENDING  Incomplete  Aerobic/Anaerobic Culture (surgical/deep wound)     Status: None   Collection Time: 05/14/19 11:21 AM   Specimen: Soft Tissue, Other  Result Value Ref Range Status   Specimen Description TISSUE RIGHT TOE 2ND  Final   Special Requests NONE  Final   Gram Stain   Final    RARE WBC PRESENT, PREDOMINANTLY PMN RARE GRAM NEGATIVE RODS    Culture   Final    RARE ESCHERICHIA COLI RARE HAEMOPHILUS PARAINFLUENZAE BETA LACTAMASE NEGATIVE FEW BACTEROIDES VULGATUS BETA LACTAMASE POSITIVE Performed at [facility], [place] [address], [place], [place] [address]    Report Status 05/17/2019 FINAL  Final   Organism ID, Bacteria ESCHERICHIA COLI  Final      Susceptibility   Escherichia coli - MIC*    AMPICILLIN 8 SENSITIVE Sensitive     CEFAZOLIN <=4 SENSITIVE Sensitive     CEFEPIME <=1 SENSITIVE Sensitive     CEFTAZIDIME <=1 SENSITIVE Sensitive     CEFTRIAXONE <=1 SENSITIVE Sensitive     CIPROFLOXACIN <=0.25 SENSITIVE Sensitive     GENTAMICIN <=1 SENSITIVE Sensitive     IMIPENEM  <=0.25 SENSITIVE Sensitive     TRIMETH/SULFA <=20 SENSITIVE Sensitive     AMPICILLIN/SULBACTAM 4 SENSITIVE Sensitive     PIP/TAZO <=4 SENSITIVE Sensitive     Extended ESBL NEGATIVE Sensitive     * RARE ESCHERICHIA COLI      Radiology Studies: Vas [place] Abi With/wo Tbi  Result Date: 05/15/2019 LOWER EXTREMITY DOPPLER STUDY Indications: Ulceration, gangrene, and Osteomyelitis. Status post toe amputation              and right hallux bone biopsy 05/14/19 High Risk Factors: Hypertension, hyperlipidemia, Diabetes. Other Factors: ESRD on dialysis.  Limitations: Bandages, surgery 05/14/19 Comparison Study: No prior study on file for comparison. Performing Technologist: Sharion Dove RVS  Examination Guidelines: A complete evaluation includes at minimum, Doppler waveform signals and systolic blood pressure reading at the level of bilateral brachial, anterior tibial, and posterior tibial arteries, when vessel segments are accessible. Bilateral testing is considered an integral part of a complete examination. Photoelectric Plethysmograph (PPG) waveforms and toe systolic pressure readings are included as required and additional duplex testing as needed. Limited examinations for reoccurring indications may be performed as noted.  ABI Findings: +---------+------------------+-----+--------+---------------+  Right     Rt Pressure (mmHg) Index Waveform Comment          +---------+------------------+-----+--------+---------------+  Brachial                                    Dialysis access  +---------+------------------+-----+--------+---------------+  PTA  255                1.42  biphasic                  +---------+------------------+-----+--------+---------------+  DP                                          bandages         +---------+------------------+-----+--------+---------------+  Great Toe                                   bandages         +---------+------------------+-----+--------+---------------+  +---------+------------------+-----+----------+-------+  Left      Lt Pressure (mmHg) Index Waveform   Comment  +---------+------------------+-----+----------+-------+  Brachial  180                      triphasic           +---------+------------------+-----+----------+-------+  PTA       254                1.41  biphasic            +---------+------------------+-----+----------+-------+  DP        119                0.66  monophasic          +---------+------------------+-----+----------+-------+  Great Toe 86                 0.48                      +---------+------------------+-----+----------+-------+ +-------+-----------+-----------+------------+------------+  ABI/TBI Today's ABI Today's TBI Previous ABI Previous TBI  +-------+-----------+-----------+------------+------------+  Right   1.4         bandages                               +-------+-----------+-----------+------------+------------+  Left    1.4         0.48                                   +-------+-----------+-----------+------------+------------+ Arterial wall calcification precludes accurate ankle pressures and ABIs. No prior study on file for comparison  Summary: Right: Resting right ankle-brachial index indicates noncompressible right lower extremity arteries. Left: Resting left ankle-brachial index indicates noncompressible left lower extremity arteries.The left toe-brachial index is abnormal.  *See table(s) above for measurements and observations.  Electronically signed by Deitra Mayo MD on 05/15/2019 at 7:07:21 PM.    Final    Vas [place] Lower Extremity Arterial Duplex  Result Date: 05/16/2019 LOWER EXTREMITY ARTERIAL DUPLEX STUDY Indications: Ulceration, gangrene, and Osteomyelitis. Status post toe amputation              and right hallux bone biopsy 05/14/19. High Risk Factors: Hypertension, hyperlipidemia, Diabetes.  Current ABI: 05/15/19 non compressible Comparison Study: no prior Performing Technologist: June Leap RDMS, RVT   Examination Guidelines: A complete evaluation includes B-mode imaging, spectral Doppler, color Doppler, and power Doppler as needed of all accessible portions of each vessel. Bilateral testing is considered an integral part of a complete examination. Limited examinations for reoccurring indications  may be performed as noted.  +-----------+--------+-----+--------+----------+-------------------------------+  RIGHT       PSV cm/s Ratio Stenosis Waveform   Comments                         +-----------+--------+-----+--------+----------+-------------------------------+  CFA Prox    100                     triphasic  mild heterogenous plaque         +-----------+--------+-----+--------+----------+-------------------------------+  DFA         78                      triphasic                                   +-----------+--------+-----+--------+----------+-------------------------------+  SFA Prox    125                     triphasic                                   +-----------+--------+-----+--------+----------+-------------------------------+  SFA Mid     125                     triphasic                                   +-----------+--------+-----+--------+----------+-------------------------------+  SFA Distal  140                     triphasic  calcified plaque with shadowing  +-----------+--------+-----+--------+----------+-------------------------------+  POP Prox    112                     triphasic  mild heterogenous plaque         +-----------+--------+-----+--------+----------+-------------------------------+  POP Distal  84                      biphasic                                    +-----------+--------+-----+--------+----------+-------------------------------+  ATA Distal  19                      biphasic   calcification of vessel walls    +-----------+--------+-----+--------+----------+-------------------------------+  PTA Distal  90                      monophasic calcification of vessel walls     +-----------+--------+-----+--------+----------+-------------------------------+  PERO Distal                         absent     calcification of vessel walls    +-----------+--------+-----+--------+----------+-------------------------------+  Summary: Right: Calcification of pedal arteries with dampened flow. Absent peroneal artery flow. No focal stenosis or occlusion noted.  See table(s) above for measurements and observations. Electronically signed by Ruta Hinds MD on 05/16/2019 at 6:00:07 PM.    Final      Scheduled Meds:  aspirin EC  81 mg Oral Daily   atorvastatin  40 mg Oral q1800  Chlorhexidine Gluconate Cloth  6 each Topical Q0600   darbepoetin (ARANESP) injection - DIALYSIS  100 mcg Intravenous Q Mon-HD   docusate sodium  100 mg Oral BID   doxercalciferol  6 mcg Intravenous Q M,W,F-HD   feeding supplement (PRO-STAT SUGAR FREE 64)  30 mL Oral BID   heparin injection (subcutaneous)  5,000 Units Subcutaneous Q8H   insulin aspart  0-5 Units Subcutaneous QHS   insulin aspart  0-9 Units Subcutaneous TID WC   multivitamin  1 tablet Oral Q M,W,F-1800   sevelamer carbonate  2.4 g Oral TID WC   sodium chloride flush  3 mL Intravenous Q12H   sodium polystyrene  15 g Oral Once   Continuous Infusions:  sodium chloride     sodium chloride     cefTRIAXone (ROCEPHIN)  IV 1 g (05/16/19 2205)   [START ON 05/18/2019] vancomycin       LOS: 2 days   Time Spent in minutes   30 minutes  Yazir Koerber D.O. on 05/17/2019 at 4:05 PM  Between 7am to 7pm - Please see pager noted on [email or web address]  After 7pm go to [email or web address]  And look for the night coverage person covering for me after hours  [facility] Office  [phone]

## 2019-05-17 NOTE — Progress Notes (Signed)
Pt c/o gas verbal order received from [facility] MD  for simethicone 160mg  TID PRN, will continue to monitor.

## 2019-05-17 NOTE — Plan of Care (Signed)
Attempted to see patient, off floor for vascular procedure. Will f/u results.  Evelina Bucy, DPM  8:50 AM

## 2019-05-18 ENCOUNTER — Telehealth: Payer: Self-pay | Admitting: *Deleted

## 2019-05-18 ENCOUNTER — Other Ambulatory Visit: Payer: Self-pay | Admitting: Podiatry

## 2019-05-18 DIAGNOSIS — Z992 Dependence on renal dialysis: Secondary | ICD-10-CM | POA: Diagnosis not present

## 2019-05-18 DIAGNOSIS — D509 Iron deficiency anemia, unspecified: Secondary | ICD-10-CM | POA: Diagnosis not present

## 2019-05-18 DIAGNOSIS — N186 End stage renal disease: Secondary | ICD-10-CM | POA: Diagnosis not present

## 2019-05-18 DIAGNOSIS — D631 Anemia in chronic kidney disease: Secondary | ICD-10-CM | POA: Diagnosis not present

## 2019-05-18 DIAGNOSIS — E1129 Type 2 diabetes mellitus with other diabetic kidney complication: Secondary | ICD-10-CM | POA: Diagnosis not present

## 2019-05-18 DIAGNOSIS — N2581 Secondary hyperparathyroidism of renal origin: Secondary | ICD-10-CM | POA: Diagnosis not present

## 2019-05-18 LAB — RENAL FUNCTION PANEL
Albumin: 2.2 g/dL — ABNORMAL LOW (ref 3.5–5.0)
Anion gap: 13 (ref 5–15)
BUN: 31 mg/dL — ABNORMAL HIGH (ref 6–20)
CO2: 28 mmol/L (ref 22–32)
Calcium: 8.7 mg/dL — ABNORMAL LOW (ref 8.9–10.3)
Chloride: 94 mmol/L — ABNORMAL LOW (ref 98–111)
Creatinine, Ser: 9.91 mg/dL — ABNORMAL HIGH (ref 0.61–1.24)
GFR calc Af Amer: 6 mL/min — ABNORMAL LOW (ref >60)
GFR calc non Af Amer: 5 mL/min — ABNORMAL LOW (ref >60)
Glucose, Bld: 115 mg/dL — ABNORMAL HIGH (ref 70–99)
Phosphorus: 5.5 mg/dL — ABNORMAL HIGH (ref 2.5–4.6)
Potassium: 4.7 mmol/L (ref 3.5–5.1)
Sodium: 135 mmol/L (ref 135–145)

## 2019-05-18 LAB — CBC
HCT: 22.2 % — ABNORMAL LOW (ref 39.0–52.0)
Hemoglobin: 7 g/dL — ABNORMAL LOW (ref 13.0–17.0)
MCH: 27.7 pg (ref 26.0–34.0)
MCHC: 31.5 g/dL (ref 30.0–36.0)
MCV: 87.7 fL (ref 80.0–100.0)
Platelets: 336 10*3/uL (ref 150–400)
RBC: 2.53 MIL/uL — ABNORMAL LOW (ref 4.22–5.81)
RDW: 16.7 % — ABNORMAL HIGH (ref 11.5–15.5)
WBC: 8.7 10*3/uL (ref 4.0–10.5)
nRBC: 0 % (ref 0.0–0.2)

## 2019-05-18 LAB — GLUCOSE, CAPILLARY: Glucose-Capillary: 109 mg/dL — ABNORMAL HIGH (ref 70–99)

## 2019-05-18 LAB — HEMOGLOBIN AND HEMATOCRIT, BLOOD
HCT: 24.4 % — ABNORMAL LOW (ref 39.0–52.0)
Hemoglobin: 7.7 g/dL — ABNORMAL LOW (ref 13.0–17.0)

## 2019-05-18 LAB — MAGNESIUM: Magnesium: 2.6 mg/dL — ABNORMAL HIGH (ref 1.7–2.4)

## 2019-05-18 LAB — PREPARE RBC (CROSSMATCH)

## 2019-05-18 MED ORDER — BLOOD GLUCOSE METER KIT: PACK | 0 refills | Status: DC

## 2019-05-18 MED ORDER — ACCU-CHEK AVIVA PLUS VI STRP: ORAL_STRIP | 1 refills | Status: DC

## 2019-05-18 MED ORDER — HYDROCODONE-ACETAMINOPHEN 10-325 MG PO TABS: 1.0000 | ORAL_TABLET | Freq: Three times a day (TID) | ORAL | 0 refills | Status: DC | PRN

## 2019-05-18 MED ORDER — SODIUM CHLORIDE 0.9% IV SOLUTION: Freq: Once | INTRAVENOUS | Status: DC

## 2019-05-18 MED ORDER — CEPHALEXIN 500 MG PO CAPS: 500.0000 mg | ORAL_CAPSULE | Freq: Every day | ORAL | 0 refills | Status: DC

## 2019-05-18 MED ORDER — ACCU-CHEK SOFTCLIX LANCET DEV MISC: 5 refills | Status: DC

## 2019-05-18 MED ORDER — ASPIRIN 81 MG PO TBEC: 81.0000 mg | DELAYED_RELEASE_TABLET | Freq: Every day | ORAL | 2 refills | Status: DC

## 2019-05-18 MED ORDER — HYDROCODONE-ACETAMINOPHEN 10-325 MG PO TABS: 1.0000 | ORAL_TABLET | ORAL | Status: DC | PRN

## 2019-05-18 MED ORDER — ATORVASTATIN CALCIUM 40 MG PO TABS: 40.0000 mg | ORAL_TABLET | Freq: Every day | ORAL | 1 refills | Status: DC

## 2019-05-18 MED ORDER — PRO-STAT SUGAR FREE PO LIQD: 30.0000 mL | Freq: Two times a day (BID) | ORAL | 0 refills | Status: DC

## 2019-05-18 NOTE — Progress Notes (Signed)
[facility] Progress Note  Name: [name] MRN: [id number] DOB: [birth year]  Chief Complaint:  Right foot pain   Subjective:  Spoke with primary team and they are discharging today and they are planning on oral antibiotics after consulting with ID.  The patient is ok with getting a unit of blood today.   Review of systems:  Denies shortness of breath or chest pain; no n/v .  Denies blood per rectum or dark stools.   --------- Background on consult:  [name] is a 53 y.o. 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  Pt reports that he has been having R foot pains for nearly 1 mo now. Around the weekend of July 4th, he noted a "blister" on his R 2nd toe. Continued to walk on it and "doctor on it by myself". He was evaluated by podiatry on 7/17 and noted to have R 1st and 2nd toe ulcerations. Xray showed osteolysis of distal 1st/2nd phalanx. This wound was debrided and plan was for home health dressing changes. On 7/24, he returned for f/u appointment. Toe was worse in appearance with odor, and he was noted to be febrile and tachycardic -> he was sent to the ED for admission and planned amputation. Of note, he did not inform any of his dialysis care team of the above despite being evaluated by MD + PA's multiple times during this time frame and he refused RN monthly foot check - he reports "I didn't want any of the other patients knowing my business" regarding lack of privacy to discuss sensitive issues while on dialysis machine.  In the ED, labs showed Na 137, K 4.2, Ca 9.2, WBC 9.9, Hgb 8.6, CRP 22.6. COVID-19 testing negative. LE u/s negative for DVT. Blood and wound Cx collected and he was started on Vanc and Ceftriaxone.  He underwent partial amputation of R 2nd toe and R great toe bone biopsy on 7/25.  Today, he is having some R foot pain and he is upset with himself that he let it get this bad. Wants to  make sure 100% that he has good blood flow to feet, worried about future amputations. Denies CP, dyspnea, N/V, diarrhea, or fever.  Dialyzes on MWF schedule at [facility] center. He is very compliant with his treatments, last HD Fri 7/24. He is due for next HD tomorrow.   Intake/Output Summary (Last 24 hours) at 05/18/2019 0844 Last data filed at 05/18/2019 0000 Gross per 24 hour  Intake 408.16 ml  Output 1878 ml  Net -1469.84 ml    Vitals:  Vitals:   05/17/19 1814 05/17/19 2004 05/18/19 0004 05/18/19 0505  BP: 135/72 (!) 147/78 (!) 150/83 (!) 146/81  Pulse: (!) 110 (!) 112 (!) 105 99  Resp: 20 17 17 20   Temp: 98.8 F (37.1 C) 99.4 F (37.4 C) 99.2 F (37.3 C) 98.6 F (37 C)  TempSrc: Oral Oral Oral Oral  SpO2: 97% 95% 93% 97%  Weight:      Height:         Physical Exam:  General adult male in bed in no acute distress  HEENT normocephalic atraumatic extraocular movements intact sclera anicteric Neck supple trachea midline Lungs clear to auscultation bilaterally normal work of breathing at rest  Heart tachycardia; regular; no rub Abdomen soft nontender nondistended Extremities no lower extremity edema  Psych normal mood and affect Access RUE AVF  Medications reviewed   Labs:  BMP Latest Ref Rng & Units 05/18/2019 05/17/2019 05/16/2019  Glucose 70 - 99 mg/dL 115(H) 93 108(H)  BUN 6 - 20 mg/dL 31(H) 69(H) 60(H)  Creatinine 0.61 - 1.24 mg/dL 9.91(H) 16.62(H) 15.37(H)  Sodium 135 - 145 mmol/L 135 132(L) 134(L)  Potassium 3.5 - 5.1 mmol/L 4.7 5.8(H) 5.2(H)  Chloride 98 - 111 mmol/L 94(L) 90(L) 89(L)  CO2 22 - 32 mmol/L 28 23 27   Calcium 8.9 - 10.3 mg/dL 8.7(L) 9.0 8.9   Dialysis Orders:  MWF at [facility] 4hr, 450/800, EDW 98kg, 2K/2.25Ca, UFP #3, AVF, heparin 2000 bolus - Hectoral 81mcg IV q HD - Mircera 68mcg IV q 2 weeks (last 7/15)   Assessment/Plan:   1.  R 1st/2nd toe osteomyelitis: S/p partial 2nd toe amputation and great toe bone.  Spoke with primary team  and they have consulted with ID and are planning for oral antibiotic regimen.   2.  ESRD:  s/p HD on 7/28 off schedule. HD today planned at outpatient unit.  Holding heparin.   3. Hyperkalemia - resolved with HD 4.  Hypertension/volume:  continue home EDW for now. 5.  Anemia: chronic disease and acute blood loss. Packed red blood cells x 1 unit today - spoke with hospitalist and she has ordered stat as discharge pending.  Will initiate iron IV x 5 doses at outpatient dialysis.  Repeat iron profile at outpatient HD as ferritin may be elevated due to acute illness.  Due for mircera as he was not given his ordered dose of aranesp on 7/27.  Increase dose to 100 mcg.  Spoke with renal PA re: these plans, as well. 6.  Secondary hyperparathyroidism calcium acceptable; hyperphos  Auryxia on med list - says giving him constipation, requested to change to Renvela pwdr which is fine - ordered.  7.  Nutrition: Alb low, on pro-stat supplements. 8.  Type 2 DM: Insulin per primary. 9.  Hyperlipidemia: Consider statin in future.   Claudia Desanctis, MD 05/18/2019 8:44 AM

## 2019-05-18 NOTE — Telephone Encounter (Signed)
Changed please inform

## 2019-05-18 NOTE — Progress Notes (Signed)
Patient with discharge home. Blood transfusion completed without any reactions. IV and telemetry removed. IV catheter intact no swelling/redness/pain at site. Discharge instructions given with verbal understanding of follow-up appointment received. When going over discharge medications informed that several prescriptions called into home pharmacy. Patient informed that several discharge medications he will not take such as Aspirin and Atorvastatin. Wife informed of discharge earlier. Personal belongings packed by patient. Patient very appreciative of care received.

## 2019-05-18 NOTE — Progress Notes (Signed)
Norco prescribed again, changed to q8 hours

## 2019-05-18 NOTE — Telephone Encounter (Signed)
[place] pharmacist states the mmEq is greater than 50 mmEq of morphine and can not be filled, can be written for no more than 3 Norco 10/325mg  per day.

## 2019-05-18 NOTE — Progress Notes (Signed)
Inpatient Diabetes Program Recommendations  AACE/ADA: New Consensus Statement on Inpatient Glycemic Control (2015)  Target Ranges:  Prepandial:   less than 140 mg/dL      Peak postprandial:   less than 180 mg/dL (1-2 hours)      Critically ill patients:  140 - 180 mg/dL   Lab Results  Component Value Date   GLUCAP 109 (H) 05/18/2019   HGBA1C 8.0 (H) 05/15/2019    Review of Glycemic Control Results for DAIN, LASETER (MRN [id number]) as of 05/18/2019 11:50  Ref. Range 05/17/2019 18:41 05/17/2019 21:42 05/18/2019 06:19  Glucose-Capillary Latest Ref Range: 70 - 99 mg/dL 105 (H) 227 (H) 109 (H)   Diabetes history: Type 2 DM Outpatient Diabetes medications: none, diet controlled Current orders for Inpatient glycemic control: Novolog 0-5 units QHS, Novolog 0-9 units TID  Inpatient Diabetes Program Recommendations:    Spoke with patient regarding diabetes management. Patient has been diet controlled, however, he feels that his A1C is up because of infection.  Reviewed patient's current A1c of 8.0%. Explained what a A1c is and what it measures. Also reviewed goal A1c with patient, importance of good glucose control @ home, and blood sugar goals. Reviewed patho of DM, role of pancreas, glycemic control in setting of infection and risk of repetitive infections with poor glycemic control, vascular changes, neuropathy, and other comorbidites. Patient needs a glucose meter kit. Encouraged to begin checking 3-4 times per day while diet controlled; focusing on FSBG and with/2 hours following meals. Admits to drinking an occasional soda and noted how high this made his blood sugar. Also, reports how difficult exercise has been with Covid closing down gyms. Patient plans to get more active when able. Briefly reviewed carb counting and the plate method.  Freestyle Libre applied to patient prior to discharge. Product reviewed, benefits vs risk, how to use and independently apply, obtaining more sensors and cost.   Discussed the necessity of follow up with PCP in the next month to ensure blood sugars are within target goals. Patient agrees and has no further questions at this time.   Thanks, Bronson Curb, MSN, RNC-OB Diabetes Coordinator [phone] (8a-5p)

## 2019-05-18 NOTE — Discharge Instructions (Signed)
Osteomyelitis, Adult  Bone infections (osteomyelitis) occur when bacteria or other germs get inside a bone. This can happen if you have an infection in another part of your body that spreads through your blood. Germs from your skin or from outside of your body can also cause this type of infection if you have a wound or a broken bone (fracture) that breaks the skin. Bone infections need to be treated quickly to prevent bone damage and to prevent the infection from spreading to other areas of your body. What are the causes? Most bone infections are caused by bacteria. They can also be caused by other germs, such as viruses and funguses. What increases the risk? You are more likely to develop this condition if you:  Recently had surgery, especially bone or joint surgery.  Have a long-term (chronic) disease, such as: ? Diabetes. ? HIV (human immunodeficiency virus). ? Rheumatoid arthritis. ? Sickle cell anemia. ? Kidney disease that requires dialysis.  Are aged 60 years or older.  Have a condition or take medicines that block or weaken your body's defense system (immune system).  Have a condition that reduces your blood flow.  Have an artificial joint.  Have had a joint or bone repaired with plates or screws (surgical hardware).  Use IV drugs.  Have a central line for IV access.  Have had trauma, such as stepping on a nail or a broken bone that came through the skin. What are the signs or symptoms? Symptoms vary depending on the type and location of your infection. Common symptoms of bone infections include:  Fever and chills.  Skin redness and warmth.  Swelling.  Pain and stiffness.  Drainage of fluid or pus near the infection. How is this diagnosed? This condition may be diagnosed based on:  Your symptoms and medical history.  A physical exam.  Tests, such as: ? A sample of tissue, fluid, or blood taken to be examined under a microscope. ? Pus or discharge swabbed  from a wound for testing to identify germs and to determine what type of medicine will kill them (culture and sensitivity). ? Blood tests.  Imaging studies. These may include: ? X-rays. ? MRI. ? CT scan. ? Bone scan. ? Ultrasound. How is this treated? Treatment for this condition depends on the cause and type of infection. Antibiotic medicines are usually the first treatment for a bone infection. This may be done in a hospital at first. You may have to continue IV antibiotics at home or take antibiotics by mouth for several weeks after that. Other treatments may include surgery to remove:  Dead or dying tissue from a bone.  An infected artificial joint.  Infected plates or screws that were used to repair a broken bone. Follow these instructions at home: Medicines   Take over-the-counter and prescription medicines only as told by your health care provider.  Take your antibiotic medicine as told by your health care provider. Do not stop taking the antibiotic even if you start to feel better.  Follow instructions from your health care provider about how to take IV antibiotics at home. You may need to have a nurse come to your home to give you the IV antibiotics. General instructions   Ask your health care provider if you have any restrictions on your activities.  If directed, put ice on the affected area: ? Put ice in a plastic bag. ? Place a towel between your skin and the bag. ? Leave the ice on for 20   minutes, 2-3 times a day.  Wash your hands often with soap and water. If soap and water are not available, use hand sanitizer.  Do not use any products that contain nicotine or tobacco, such as cigarettes and e-cigarettes. These can delay bone healing. If you need help quitting, ask your health care provider.  Keep all follow-up visits as told by your health care provider. This is important. Contact a health care provider if:  You develop a fever or chills.  You have  redness, warmth, pain, or swelling that returns after treatment. Get help right away if:  You have rapid breathing or you have trouble breathing.  You have chest pain.  You cannot drink fluids or make urine.  The affected area swells, changes color, or turns blue.  You have numbness or severe pain in the affected area. Summary  Bone infections (osteomyelitis) occur when bacteria or other germs get inside a bone.  You may be more likely to get this type of infection if you have a condition, such as diabetes, that lowers your ability to fight infection or increases your chances of getting an infection.  Most bone infections are caused by bacteria. They can also be caused by other germs, such as viruses and funguses.  Treatment for this condition usually starts with taking antibiotics. Further treatment depends on the cause and type of infection. This information is not intended to replace advice given to you by your health care provider. Make sure you discuss any questions you have with your health care provider. Document Released: 10/06/2005 Document Revised: 10/22/2017 Document Reviewed: 10/15/2017 Elsevier Patient Education  2020 [employer]  

## 2019-05-18 NOTE — Telephone Encounter (Signed)
I informed [name] Dr. Eleanora Neighbor change of orders, and to call [facility] to see when it was available for pick up.

## 2019-05-18 NOTE — Discharge Summary (Signed)
Physician Discharge Summary  [name] XTG:[id number] DOB: 05-18-1966 DOA: 05/13/2019  PCP: Hayden Rasmussen, MD  Admit date: 05/13/2019 Discharge date: 05/18/2019  Time spent: 45 minutes  Recommendations for Outpatient Follow-up:  Patient will be discharged to home.  Patient will need to follow up with primary care provider within one week of discharge.  Continue hemodialysis. Follow up with podiatry, Dr. March Rummage. Patient should continue medications as prescribed.  Patient should follow a renal/carb modified diet.   Discharge Diagnoses:  Right foot osteomyelitis  Peripheral vascular disease Hyperlipidemia ESRD  Essential hypertension Anemia of chronic disease Diabetes mellitus type 2  Discharge Condition: Stable  Diet recommendation: Renal/carb modified   Filed Weights   05/13/19 1905 05/17/19 1743  Weight: 98 kg 98.5 kg    History of present illness:  On 05/14/2019 by Dr. Garen Grams is a 53 y.o. male with medical history significant of hypertension, hyperlipidemia, diabetes mellitus, ESRD-HD (MWF), obesity, iron deficiency anemia, who presents with right foot pain.  Patient states that he injured second toe of left foot on July 4, since then he developed a blister which has been progressively worsening, and formed an ulcer, and got infected. He was treated with clindamycin from 7/17-7/14 by podiatrist, without significant improvement.  He has worsened pain with draining. The pain is constant, 8 out of 10 severity, sharp, nonradiating.  He was seen by podiatrist, Dr. March Rummage, who recommended patient be admitted and Dr. March Rummage will do surgery in the morning.  Patient does not have chills, but has fever of 101.2 today.  Denies chest pain, shortness breath, cough.  No nausea vomiting, diarrhea, abdominal pain, symptoms of UTI and unilateral weakness.  Patient also reports right ankle and calf pain recently.  Hospital Course:  Right foot osteomyelitis  -Presented with  right foot pain and diagnosed with osteomyelitis -Blood culture showed no growth to date -Podiatry consulted and appreciated, status post partial second toe amputation with bone biopsy.  Weightbearing as tolerated -Culture showing rare E. coli, rare Haemophilus parainfluenza, beta-lactamase negative and negative, few bacteroids vulgatus-pansensitive -Hemoglobin A1c 8, LDL 141 -Patient was placed on IV vancomycin and Rocephin -Continue pain control -ABI: Right indicates noncompressible right lower extremity arteries.  Left noncompressible left lower extremity arteries.  Left toe brachial index is abnormal. 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 -Surgical patho pending -Discussed with Infectious disease, given that OM cured of 2nd toe, and chronic of great toe, oral antibiotics (augmentin) for a few days following surgery should be enough. Would not place on IV antibiotics as there is not other OM. If IV antibiotics were needed, ancef would cover Ecoli. -Will discharge with Keflex 556m q24 hours (patient has an allergy to augmentin but has received cephalosporin). On dialysis days, should be taken after completion of hemodialysis.  Discussed this with Dr. PMarch Rummage(podiatry), he is concerned about soft tissue infection and would like 2 weeks of antibiotics and will follow up closely with the patient.   Peripheral vascular disease -Lower extremity Dopplers: Right no evidence of DVT however portions of examination were limited.  Left no evidence of common femoral vein obstruction. -ABI as above -Right vascular lower extremity arterial duplex: Calcification of pedal arteries with dampened flow.  Absent peroneal artery flow.  No focal stenosis or occlusion. -Continue aspirin, statin -Vascular surgery consulted and appreciated.  Status post one-vessel posterior tibial artery runoff bilateral foot.  Vascular surgery recommended  continuation of IV antibiotics for wound healing of his osteomyelitis.  If this fails, patient will likely need below-knee amputation  Hyperlipidemia -Lipid panel total cholesterol 231, HDL 43, LDL 141, triglycerides 236 -Continue statin  ESRD  -Patient dialyzes Monday, Wednesday, Friday -Nephrology consulted and appreciated  Essential hypertension -Does not appear to be any home medications.  Acute on chronic anemia/ Anemia of chronic disease -Hemoglobin currently stable, 7 (repeat hemoglobin 7.7) -Anemia panel shows iron of 18, ferritin 1270, saturation ratio of 17, folate 2402, vitamin B12 1094 -TSH 1.013 -Transfused 1u PRBCs -Discussed iron with nephrology, will get iron with outpatient HD  Diabetes mellitus type 2 -Hemoglobin A1c 8- appears to be diet controlled -discussed with patient- he declined insulin or pills. Feels he can manage his diabetes via diet. Does want a refill on his meter, strips, etc -Diabetes coordinator consulted and appreciated  [facility]ult[facility] Podiatry Vascular surgery Infectious disease, Dr. Johnnye Sima, via phone  Procedures  Right second toe amputation on 05/14/2019 Lower extremity Doppler ABI Right lower extremity arterial duplex One-vessel posterior tibial artery runoff bilateral foot  Discharge Exam: Vitals:   05/18/19 0505 05/18/19 1030  BP: (!) 146/81 129/83  Pulse: 99 (!) 104  Resp: 20   Temp: 98.6 F (37 C) 99.1 F (37.3 C)  SpO2: 97% 97%     General: Well developed, well nourished, NAD, appears stated age  HEENT: NCAT, mucous membranes moist.  Cardiovascular: S1 S2 auscultated, RRR, no murmur  Respiratory: Clear to auscultation bilaterally   Abdomen: Soft, nontender, nondistended, + bowel sounds  Extremities: warm dry without cyanosis clubbing or edema. Right foot dressing in place  Neuro: AAOx3, nonfocal  Psych: Appropriate mood and affect  Discharge Instructions Discharge Instructions    Discharge instructions    Complete by: As directed    Patient will be discharged to home.  Patient will need to follow up with primary care provider within one week of discharge.  Continue hemodialysis. Follow up with podiatry, Dr. March Rummage. Patient should continue medications as prescribed.  Patient should follow a renal/carb modified diet.     Allergies as of 05/18/2019      Reactions   Amoxicillin Itching   Did it involve swelling of the face/tongue/throat, SOB, or low BP? Unknown Did it involve sudden or severe rash/hives, skin peeling, or any reaction on the inside of your mouth or nose? Unknown Did you need to seek medical attention at a hospital or doctor's office? Unknown When did it last happen?20 years ago If all above answers are NO, may proceed with cephalosporin use.   Hydromorphone Itching   Patient states he may take with benadryl. Makes feet itch.   Percocet [oxycodone-acetaminophen] Itching, Other (See Comments)   Legs only. Itching and burning feeling.      Medication List    STOP taking these medications   clindamycin 300 MG capsule Commonly known as: Cleocin     TAKE these medications   Accu-Chek Aviva Plus test strip Generic drug: glucose blood Use as instructed   accu-chek softclix lancets Use as instructed   acetaminophen 500 MG tablet Commonly known as: TYLENOL Take 1,000 mg by mouth every 6 (six) hours as needed for moderate pain or headache.   aspirin 81 MG EC tablet Take 1 tablet (81 mg total) by mouth daily.   atorvastatin 40 MG tablet Commonly known as: LIPITOR Take 1 tablet (40 mg total) by mouth daily at 6 PM.   Auryxia 1 GM 210 MG(Fe) tablet Generic drug: ferric citrate  Take 210-630 mg by mouth See admin instructions. Take 420 to 630 mg with each meal depending on the size of the meal, and take 210 mg with each snack   blood glucose meter kit and supplies Dispense based on patient and insurance preference. Use up to four times daily as directed. (FOR  ICD-10 E10.9, E11.9). What changed: You were already taking a medication with the same name, and this prescription was added. Make sure you understand how and when to take each.   blood glucose meter kit and supplies Dispense based on patient and insurance preference. Use up to four times daily as directed. (FOR ICD-9 250.00, 250.01). What changed: Another medication with the same name was added. Make sure you understand how and when to take each.   cephALEXin 500 MG capsule Commonly known as: KEFLEX Take 1 capsule (500 mg total) by mouth daily for 14 days. On dialysis days, take after completion of dialysis.   feeding supplement (PRO-STAT SUGAR FREE 64) Liqd Take 30 mLs by mouth 2 (two) times daily.   HYDROcodone-acetaminophen 10-325 MG tablet Commonly known as: NORCO Take 1 tablet by mouth every 4 (four) hours as needed.   lidocaine 2 % jelly Commonly known as: XYLOCAINE Apply 1 application topically as needed.   loratadine 10 MG tablet Commonly known as: CLARITIN Take 10 mg by mouth daily as needed for allergies.   Melatonin 5 MG Caps Take 10 mg by mouth at bedtime as needed (sleep).   multivitamin Tabs tablet Take 1 tablet by mouth every Monday, Wednesday, and Friday. After dialysis      Allergies  Allergen Reactions   Amoxicillin Itching    Did it involve swelling of the face/tongue/throat, SOB, or low BP? Unknown Did it involve sudden or severe rash/hives, skin peeling, or any reaction on the inside of your mouth or nose? Unknown Did you need to seek medical attention at a hospital or doctor's office? Unknown When did it last happen?20 years ago If all above answers are NO, may proceed with cephalosporin use.    Hydromorphone Itching    Patient states he may take with benadryl. Makes feet itch.   Percocet [Oxycodone-Acetaminophen] Itching and Other (See Comments)    Legs only. Itching and burning feeling.   Follow-up Information    Hayden Rasmussen,  MD. Schedule an appointment as soon as possible for a visit in 1 week(s).   Specialty: Family Medicine Why: Hospital follow up Contact information: [place] [address] [phone]        Evelina Bucy, DPM. Schedule an appointment as soon as possible for a visit in 1 week(s).   Specialty: Podiatry Why: Hospital follow up Contact information: [address] [place] [address] [phone]            The results of significant diagnostics from this hospitalization (including imaging, microbiology, ancillary and laboratory) are listed below for reference.    Significant Diagnostic Studies: Dg Ankle Complete Right  Result Date: 05/09/2019 Please see detailed radiograph report in office note.  Dg Foot 2 Views Right  Result Date: 05/14/2019 CLINICAL DATA:  S/p partial amputation of the 2nd toe. EXAM: RIGHT FOOT - 2 VIEW COMPARISON:  05/06/2011 FINDINGS: Amputation of the 2nd digit from the PIP noted. Irregularity of the great toe distal phalanx is unchanged. No other significant abnormalities identified. 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 Electronically Signed   By: Margarette Canada M.D.   On: 05/14/2019  12:23   Dg Foot 2 Views Right  Result Date: 05/09/2019 Please see detailed radiograph report in office note.  Vas [place] Burnard Bunting With/wo Tbi  Result Date: 05/15/2019 LOWER EXTREMITY DOPPLER STUDY Indications: Ulceration, gangrene, and Osteomyelitis. Status post toe amputation              and right hallux bone biopsy 05/14/19 High Risk Factors: Hypertension, hyperlipidemia, Diabetes. Other Factors: ESRD on dialysis.  Limitations: Bandages, surgery 05/14/19 Comparison Study: No prior study on file for comparison. Performing Technologist: Sharion Dove RVS  Examination Guidelines: A complete evaluation includes at minimum, Doppler waveform signals and systolic blood pressure reading at the level of bilateral brachial, anterior tibial, and  posterior tibial arteries, when vessel segments are accessible. Bilateral testing is considered an integral part of a complete examination. Photoelectric Plethysmograph (PPG) waveforms and toe systolic pressure readings are included as required and additional duplex testing as needed. Limited examinations for reoccurring indications may be performed as noted.  ABI Findings: +---------+------------------+-----+--------+---------------+  Right     Rt Pressure (mmHg) Index Waveform Comment          +---------+------------------+-----+--------+---------------+  Brachial                                    Dialysis access  +---------+------------------+-----+--------+---------------+  PTA       255                1.42  biphasic                  +---------+------------------+-----+--------+---------------+  DP                                          bandages         +---------+------------------+-----+--------+---------------+  Great Toe                                   bandages         +---------+------------------+-----+--------+---------------+ +---------+------------------+-----+----------+-------+  Left      Lt Pressure (mmHg) Index Waveform   Comment  +---------+------------------+-----+----------+-------+  Brachial  180                      triphasic           +---------+------------------+-----+----------+-------+  PTA       254                1.41  biphasic            +---------+------------------+-----+----------+-------+  DP        119                0.66  monophasic          +---------+------------------+-----+----------+-------+  Great Toe 86                 0.48                      +---------+------------------+-----+----------+-------+ +-------+-----------+-----------+------------+------------+  ABI/TBI Today's ABI Today's TBI Previous ABI Previous TBI  +-------+-----------+-----------+------------+------------+  Right   1.4         bandages                                +-------+-----------+-----------+------------+------------+  Left    1.4         0.48                                   +-------+-----------+-----------+------------+------------+ Arterial wall calcification precludes accurate ankle pressures and ABIs. No prior study on file for comparison  Summary: Right: Resting right ankle-brachial index indicates noncompressible right lower extremity arteries. Left: Resting left ankle-brachial index indicates noncompressible left lower extremity arteries.The left toe-brachial index is abnormal.  *See table(s) above for measurements and observations.  Electronically signed by Deitra Mayo MD on 05/15/2019 at 7:07:21 PM.    Final    Vas [place] Lower Extremity Arterial Duplex  Result Date: 05/16/2019 LOWER EXTREMITY ARTERIAL DUPLEX STUDY Indications: Ulceration, gangrene, and Osteomyelitis. Status post toe amputation              and right hallux bone biopsy 05/14/19. High Risk Factors: Hypertension, hyperlipidemia, Diabetes.  Current ABI: 05/15/19 non compressible Comparison Study: no prior Performing Technologist: June Leap RDMS, RVT  Examination Guidelines: A complete evaluation includes B-mode imaging, spectral Doppler, color Doppler, and power Doppler as needed of all accessible portions of each vessel. Bilateral testing is considered an integral part of a complete examination. Limited examinations for reoccurring indications may be performed as noted.  +-----------+--------+-----+--------+----------+-------------------------------+  RIGHT       PSV cm/s Ratio Stenosis Waveform   Comments                         +-----------+--------+-----+--------+----------+-------------------------------+  CFA Prox    100                     triphasic  mild heterogenous plaque         +-----------+--------+-----+--------+----------+-------------------------------+  DFA         78                      triphasic                                    +-----------+--------+-----+--------+----------+-------------------------------+  SFA Prox    125                     triphasic                                   +-----------+--------+-----+--------+----------+-------------------------------+  SFA Mid     125                     triphasic                                   +-----------+--------+-----+--------+----------+-------------------------------+  SFA Distal  140                     triphasic  calcified plaque with shadowing  +-----------+--------+-----+--------+----------+-------------------------------+  POP Prox    112                     triphasic  mild heterogenous plaque         +-----------+--------+-----+--------+----------+-------------------------------+  POP Distal  84  biphasic                                    +-----------+--------+-----+--------+----------+-------------------------------+  ATA Distal  19                      biphasic   calcification of vessel walls    +-----------+--------+-----+--------+----------+-------------------------------+  PTA Distal  90                      monophasic calcification of vessel walls    +-----------+--------+-----+--------+----------+-------------------------------+  PERO Distal                         absent     calcification of vessel walls    +-----------+--------+-----+--------+----------+-------------------------------+  Summary: Right: Calcification of pedal arteries with dampened flow. Absent peroneal artery flow. No focal stenosis or occlusion noted.  See table(s) above for measurements and observations. Electronically signed by Ruta Hinds MD on 05/16/2019 at 6:00:07 PM.    Final    Vas [place] Lower Extremity Venous (dvt) (only [place])  Result Date: 05/14/2019  Lower Venous Study Indications: Pain, Swelling, and infection of foot.  Limitations: Edema, shadowing, and leg elevation. Comparison Study: No prior study on file for comparison. Performing Technologist: Sharion Dove RVS  Examination Guidelines: A complete evaluation includes B-mode imaging, spectral Doppler, color Doppler, and power Doppler as needed of all accessible portions of each vessel. Bilateral testing is considered an integral part of a complete examination. Limited examinations for reoccurring indications may be performed as noted.  +---------+---------------+---------+-----------+----------+-------------------+  RIGHT     Compressibility Phasicity Spontaneity Properties Summary              +---------+---------------+---------+-----------+----------+-------------------+  CFV       Full            Yes       Yes                                         +---------+---------------+---------+-----------+----------+-------------------+  SFJ       Full                                                                  +---------+---------------+---------+-----------+----------+-------------------+  FV Prox   Full                                                                  +---------+---------------+---------+-----------+----------+-------------------+  FV Mid    Full                                                                  +---------+---------------+---------+-----------+----------+-------------------+  FV Distal Full                                                                  +---------+---------------+---------+-----------+----------+-------------------+  PFV       Full                                                                  +---------+---------------+---------+-----------+----------+-------------------+  POP       Full            Yes       Yes                                         +---------+---------------+---------+-----------+----------+-------------------+  PTV                                                        visualized by color  +---------+---------------+---------+-----------+----------+-------------------+  PERO                                                       visualized  by color  +---------+---------------+---------+-----------+----------+-------------------+   +----+---------------+---------+-----------+----------+-------+  LEFT Compressibility Phasicity Spontaneity Properties Summary  +----+---------------+---------+-----------+----------+-------+  CFV  Full            Yes       Yes                             +----+---------------+---------+-----------+----------+-------+     Summary: Right: There is no evidence of deep vein thrombosis in the lower extremity. However, portions of this examination were limited- see technologist comments above. Left: No evidence of common femoral vein obstruction.  *See table(s) above for measurements and observations. Electronically signed by Deitra Mayo MD on 05/14/2019 at 9:54:30 AM.    Final     Microbiology: Recent Results (from the past 240 hour(s))  SARS Coronavirus 2 (Performed in [place] hospital lab)     Status: None   Collection Time: 05/13/19  1:00 PM   Specimen: Nasal Swab  Result Value Ref Range Status   SARS Coronavirus 2 NEGATIVE NEGATIVE Final    Comment: (NOTE) SARS-CoV-2 target nucleic acids are NOT DETECTED. The SARS-CoV-2 RNA is generally detectable in upper and lower respiratory specimens during the acute phase of infection. Negative results do not preclude SARS-CoV-2 infection, do not rule out co-infections with other pathogens, and should not be used as the sole basis for treatment or other patient management decisions. Negative results must be combined with clinical observations, patient history, and epidemiological information. The expected result is Negative. Fact Sheet for Patients: SugarRoll.be  Fact Sheet for Healthcare Providers: https://www.woods-mathews.com/ This test is not yet approved or cleared by the [place] FDA and  has been authorized for detection and/or diagnosis of SARS-CoV-2 by FDA under an Emergency Use Authorization  (EUA). This EUA will remain  in effect (meaning this test can be used) for the duration of the COVID-19 declaration under Section 56 4(b)(1) of the Act, 21 U.S.C. section 360bbb-3(b)(1), unless the authorization is terminated or revoked sooner. Performed at [facility], [place] [address] [place], [place] [address]   SARS Coronavirus 2 (CEPHEID - Performed in [place] hospital lab), Hosp Order     Status: None   Collection Time: 05/14/19  2:32 AM   Specimen: Nasopharyngeal Swab  Result Value Ref Range Status   SARS Coronavirus 2 NEGATIVE NEGATIVE Final    Comment: (NOTE) If result is NEGATIVE SARS-CoV-2 target nucleic acids are NOT DETECTED. The SARS-CoV-2 RNA is generally detectable in upper and lower  respiratory specimens during the acute phase of infection. The lowest  concentration of SARS-CoV-2 viral copies this assay can detect is 250  copies / mL. A negative result does not preclude SARS-CoV-2 infection  and should not be used as the sole basis for treatment or other  patient management decisions.  A negative result may occur with  improper specimen collection / handling, submission of specimen other  than nasopharyngeal swab, presence of viral mutation(s) within the  areas targeted by this assay, and inadequate number of viral copies  (<250 copies / mL). A negative result must be combined with clinical  observations, patient history, and epidemiological information. If result is POSITIVE SARS-CoV-2 target nucleic acids are DETECTED. The SARS-CoV-2 RNA is generally detectable in upper and lower  respiratory specimens dur ing the acute phase of infection.  Positive  results are indicative of active infection with SARS-CoV-2.  Clinical  correlation with patient history and other diagnostic information is  necessary to determine patient infection status.  Positive results do  not rule out bacterial infection or co-infection with other viruses. If result is PRESUMPTIVE  POSTIVE SARS-CoV-2 nucleic acids MAY BE PRESENT.   A presumptive positive result was obtained on the submitted specimen  and confirmed on repeat testing.  While 2019 novel coronavirus  (SARS-CoV-2) nucleic acids may be present in the submitted sample  additional confirmatory testing may be necessary for epidemiological  and / or clinical management purposes  to differentiate between  SARS-CoV-2 and other Sarbecovirus currently known to infect humans.  If clinically indicated additional testing with an alternate test  methodology [phone]) is advised. The SARS-CoV-2 RNA is generally  detectable in upper and lower respiratory sp ecimens during the acute  phase of infection. The expected result is Negative. Fact Sheet for Patients:  StrictlyIdeas.no Fact Sheet for Healthcare Providers: BankingDealers.co.za This test is not yet approved or cleared by the [place] FDA and has been authorized for detection and/or diagnosis of SARS-CoV-2 by FDA under an Emergency Use Authorization (EUA).  This EUA will remain in effect (meaning this test can be used) for the duration of the COVID-19 declaration under Section 564(b)(1) of the Act, 21 U.S.C. section 360bbb-3(b)(1), unless the authorization is terminated or revoked sooner. Performed at [facility], [place] [address] [place], [place] [address]   Blood culture (routine x 2)     Status: None (Preliminary result)   Collection Time: 05/14/19  2:48 AM   Specimen: BLOOD  Result Value Ref Range Status   Specimen Description  BLOOD LEFT ARM  Final   Special Requests   Final    BOTTLES DRAWN AEROBIC AND ANAEROBIC Blood Culture results may not be optimal due to an inadequate volume of blood received in culture bottles   Culture   Final    NO GROWTH 4 DAYS Performed at [facility], [place] [address], [place], [place] [address]    Report Status PENDING  Incomplete  Blood culture (routine x 2)      Status: None (Preliminary result)   Collection Time: 05/14/19  2:55 AM   Specimen: BLOOD LEFT HAND  Result Value Ref Range Status   Specimen Description BLOOD LEFT HAND  Final   Special Requests   Final    BOTTLES DRAWN AEROBIC AND ANAEROBIC Blood Culture adequate volume   Culture   Final    NO GROWTH 4 DAYS Performed at [facility], [place] [address], [place], [place] [address]    Report Status PENDING  Incomplete  MRSA PCR Screening     Status: None   Collection Time: 05/14/19  5:29 AM   Specimen: Nasal Mucosa; Nasopharyngeal  Result Value Ref Range Status   MRSA by PCR NEGATIVE NEGATIVE Final    Comment:        The GeneXpert MRSA Assay (FDA approved for NASAL specimens only), is one component of a comprehensive MRSA colonization surveillance program. It is not intended to diagnose MRSA infection nor to guide or monitor treatment for MRSA infections. Performed at [facility], [place] [address], [place], [place] [address]   Aerobic/Anaerobic Culture (surgical/deep wound)     Status: None (Preliminary result)   Collection Time: 05/14/19 11:20 AM   Specimen: Bone; Tissue  Result Value Ref Range Status   Specimen Description BONE 1ST DISTAL PHALANX  Final   Special Requests NONE  Final   Gram Stain   Final    RARE WBC PRESENT, PREDOMINANTLY PMN NO ORGANISMS SEEN    Culture   Final    NO GROWTH 4 DAYS NO ANAEROBES ISOLATED; CULTURE IN PROGRESS FOR 5 DAYS Performed at [facility], [place] [address], [place], [place] [address]    Report Status PENDING  Incomplete  Aerobic/Anaerobic Culture (surgical/deep wound)     Status: None   Collection Time: 05/14/19 11:21 AM   Specimen: Soft Tissue, Other  Result Value Ref Range Status   Specimen Description TISSUE RIGHT TOE 2ND  Final   Special Requests NONE  Final   Gram Stain   Final    RARE WBC PRESENT, PREDOMINANTLY PMN RARE GRAM NEGATIVE RODS    Culture   Final    RARE ESCHERICHIA COLI RARE HAEMOPHILUS  PARAINFLUENZAE BETA LACTAMASE NEGATIVE FEW BACTEROIDES VULGATUS BETA LACTAMASE POSITIVE Performed at [facility], [place] [address], [place], [place] [address]    Report Status 05/17/2019 FINAL  Final   Organism ID, Bacteria ESCHERICHIA COLI  Final      Susceptibility   Escherichia coli - MIC*    AMPICILLIN 8 SENSITIVE Sensitive     CEFAZOLIN <=4 SENSITIVE Sensitive     CEFEPIME <=1 SENSITIVE Sensitive     CEFTAZIDIME <=1 SENSITIVE Sensitive     CEFTRIAXONE <=1 SENSITIVE Sensitive     CIPROFLOXACIN <=0.25 SENSITIVE Sensitive     GENTAMICIN <=1 SENSITIVE Sensitive     IMIPENEM <=0.25 SENSITIVE Sensitive     TRIMETH/SULFA <=20 SENSITIVE Sensitive     AMPICILLIN/SULBACTAM 4 SENSITIVE Sensitive     PIP/TAZO <=4 SENSITIVE  Sensitive     Extended ESBL NEGATIVE Sensitive     * RARE ESCHERICHIA COLI     Labs: Basic Metabolic Panel: Recent Labs  Lab 05/13/19 1928 05/14/19 0943 05/15/19 0507 05/16/19 1522 05/17/19 0353 05/18/19 0354  NA 137 134* 133* 134* 132* 135  K 4.2 4.3 4.3 5.2* 5.8* 4.7  CL 92*  --  90* 89* 90* 94*  CO2 31  --  '27 27 23 28  ' GLUCOSE 136* 117* 190* 108* 93 115*  BUN 17  --  39* 60* 69* 31*  CREATININE 7.69*  --  11.88* 15.37* 16.62* 9.91*  CALCIUM 9.2  --  8.9 8.9 9.0 8.7*  MG  --   --  2.6* 2.7* 2.9* 2.6*  PHOS  --   --   --  7.5*  --  5.5*   Liver Function Tests: Recent Labs  Lab 05/13/19 1928 05/15/19 0507 05/16/19 1522 05/17/19 0353 05/18/19 0354  AST 14* 11* 21 26  --   ALT '14 13 18 19  ' --   ALKPHOS 86 80 132* 124  --   BILITOT 0.6 0.6 0.8 0.9  --   PROT 7.7 6.1* 6.0* 6.8  --   ALBUMIN 3.1* 2.4* 2.7* 2.6* 2.2*   No results for input(s): LIPASE, AMYLASE in the last 168 hours. No results for input(s): AMMONIA in the last 168 hours. CBC: Recent Labs  Lab 05/13/19 1928 05/14/19 0736  05/15/19 0507 05/16/19 1522 05/17/19 0353 05/18/19 0354 05/18/19 0851  WBC 9.9 9.4  --  8.6 9.7 9.5 8.7  --   NEUTROABS 8.0*  --   --   --   --    --   --   --   HGB 8.6* 9.7*   < > 7.5* 8.1* 8.9* 7.0* 7.7*  HCT 28.7* 32.1*   < > 23.4* 25.3*   23.9* 28.2* 22.2* 24.4*  MCV 92.9 91.2  --  87.3 86.6 87.0 87.7  --   PLT 354 367  --  303 389 362 336  --    < > = values in this interval not displayed.   Cardiac Enzymes: No results for input(s): CKTOTAL, CKMB, CKMBINDEX, TROPONINI in the last 168 hours. BNP: BNP (last 3 results) No results for input(s): BNP in the last 8760 hours.  ProBNP (last 3 results) No results for input(s): PROBNP in the last 8760 hours.  CBG: Recent Labs  Lab 05/17/19 0849 05/17/19 1300 05/17/19 1841 05/17/19 2142 05/18/19 0619  GLUCAP 114* 133* 105* 227* 109*       Signed:  Lockie Bothun  [facility] 05/18/2019, 11:03 AM

## 2019-05-18 NOTE — Progress Notes (Signed)
Renal Navigator met with patient at bedside to ensure transportation to and from rescheduled OP HD appointment today. Patient states his wife can transport him and that he is very pleased that he will be able to receive his HD treatment at him home clinic today. He reports no concerns or needs at this time and understands that his HD clinic appointment is today at 12:55pm and should arrive 15-20 minutes early if possible.  Alphonzo Cruise, [place] Renal Navigator [phone]

## 2019-05-18 NOTE — Telephone Encounter (Signed)
Pt states [facility] states Dr. Eleanora Neighbor pain medication rx is over the recommended amount and they need a call from Dr. Eleanora Neighbor office.

## 2019-05-19 LAB — CULTURE, BLOOD (ROUTINE X 2)
Culture: NO GROWTH
Culture: NO GROWTH
Special Requests: ADEQUATE

## 2019-05-19 LAB — TYPE AND SCREEN
ABO/RH(D): O POS
Antibody Screen: NEGATIVE
Unit division: 0

## 2019-05-19 LAB — BPAM RBC
Blood Product Expiration Date: 202008242359
ISSUE DATE / TIME: 202007291037
Unit Type and Rh: 5100

## 2019-05-19 LAB — AEROBIC/ANAEROBIC CULTURE W GRAM STAIN (SURGICAL/DEEP WOUND): Culture: NO GROWTH

## 2019-05-20 ENCOUNTER — Ambulatory Visit (INDEPENDENT_AMBULATORY_CARE_PROVIDER_SITE_OTHER): Payer: Self-pay | Admitting: Podiatry

## 2019-05-20 ENCOUNTER — Other Ambulatory Visit: Payer: Self-pay

## 2019-05-20 DIAGNOSIS — Z4800 Encounter for change or removal of nonsurgical wound dressing: Secondary | ICD-10-CM | POA: Diagnosis not present

## 2019-05-20 DIAGNOSIS — E1122 Type 2 diabetes mellitus with diabetic chronic kidney disease: Secondary | ICD-10-CM | POA: Diagnosis not present

## 2019-05-20 DIAGNOSIS — D631 Anemia in chronic kidney disease: Secondary | ICD-10-CM | POA: Diagnosis not present

## 2019-05-20 DIAGNOSIS — E1169 Type 2 diabetes mellitus with other specified complication: Secondary | ICD-10-CM | POA: Diagnosis not present

## 2019-05-20 DIAGNOSIS — L97516 Non-pressure chronic ulcer of other part of right foot with bone involvement without evidence of necrosis: Secondary | ICD-10-CM | POA: Diagnosis not present

## 2019-05-20 DIAGNOSIS — M869 Osteomyelitis, unspecified: Secondary | ICD-10-CM

## 2019-05-20 DIAGNOSIS — Z992 Dependence on renal dialysis: Secondary | ICD-10-CM | POA: Diagnosis not present

## 2019-05-20 DIAGNOSIS — N186 End stage renal disease: Secondary | ICD-10-CM | POA: Diagnosis not present

## 2019-05-20 DIAGNOSIS — M86671 Other chronic osteomyelitis, right ankle and foot: Secondary | ICD-10-CM

## 2019-05-20 DIAGNOSIS — D509 Iron deficiency anemia, unspecified: Secondary | ICD-10-CM | POA: Diagnosis not present

## 2019-05-20 DIAGNOSIS — N2581 Secondary hyperparathyroidism of renal origin: Secondary | ICD-10-CM | POA: Diagnosis not present

## 2019-05-20 DIAGNOSIS — M86071 Acute hematogenous osteomyelitis, right ankle and foot: Secondary | ICD-10-CM | POA: Diagnosis not present

## 2019-05-20 DIAGNOSIS — E1129 Type 2 diabetes mellitus with other diabetic kidney complication: Secondary | ICD-10-CM | POA: Diagnosis not present

## 2019-05-20 DIAGNOSIS — E11621 Type 2 diabetes mellitus with foot ulcer: Secondary | ICD-10-CM | POA: Diagnosis not present

## 2019-05-20 MED ORDER — CLINDAMYCIN HCL 300 MG PO CAPS: 300.0000 mg | ORAL_CAPSULE | Freq: Two times a day (BID) | ORAL | 0 refills | Status: DC

## 2019-05-21 DIAGNOSIS — E1129 Type 2 diabetes mellitus with other diabetic kidney complication: Secondary | ICD-10-CM | POA: Diagnosis not present

## 2019-05-21 DIAGNOSIS — Z992 Dependence on renal dialysis: Secondary | ICD-10-CM | POA: Diagnosis not present

## 2019-05-21 DIAGNOSIS — N186 End stage renal disease: Secondary | ICD-10-CM | POA: Diagnosis not present

## 2019-05-22 ENCOUNTER — Inpatient Hospital Stay (HOSPITAL_COMMUNITY): Payer: Medicare Other

## 2019-05-22 ENCOUNTER — Encounter: Payer: Self-pay | Admitting: Podiatry

## 2019-05-22 ENCOUNTER — Inpatient Hospital Stay (HOSPITAL_COMMUNITY)
Admission: AD | Admit: 2019-05-22 | Discharge: 2019-05-31 | DRG: 617 | Disposition: A | Discharge status: Home Health | Payer: Medicare Other | Source: Ambulatory Visit | Attending: Internal Medicine | Admitting: Internal Medicine

## 2019-05-22 DIAGNOSIS — E1129 Type 2 diabetes mellitus with other diabetic kidney complication: Secondary | ICD-10-CM | POA: Diagnosis not present

## 2019-05-22 DIAGNOSIS — E11621 Type 2 diabetes mellitus with foot ulcer: Secondary | ICD-10-CM | POA: Diagnosis present

## 2019-05-22 DIAGNOSIS — E739 Lactose intolerance, unspecified: Secondary | ICD-10-CM | POA: Diagnosis present

## 2019-05-22 DIAGNOSIS — N186 End stage renal disease: Secondary | ICD-10-CM

## 2019-05-22 DIAGNOSIS — Z79899 Other long term (current) drug therapy: Secondary | ICD-10-CM

## 2019-05-22 DIAGNOSIS — Z992 Dependence on renal dialysis: Secondary | ICD-10-CM

## 2019-05-22 DIAGNOSIS — E1122 Type 2 diabetes mellitus with diabetic chronic kidney disease: Secondary | ICD-10-CM | POA: Diagnosis not present

## 2019-05-22 DIAGNOSIS — L97519 Non-pressure chronic ulcer of other part of right foot with unspecified severity: Secondary | ICD-10-CM | POA: Diagnosis not present

## 2019-05-22 DIAGNOSIS — M898X9 Other specified disorders of bone, unspecified site: Secondary | ICD-10-CM | POA: Diagnosis present

## 2019-05-22 DIAGNOSIS — E1152 Type 2 diabetes mellitus with diabetic peripheral angiopathy with gangrene: Secondary | ICD-10-CM | POA: Diagnosis not present

## 2019-05-22 DIAGNOSIS — L089 Local infection of the skin and subcutaneous tissue, unspecified: Secondary | ICD-10-CM | POA: Diagnosis not present

## 2019-05-22 DIAGNOSIS — E1169 Type 2 diabetes mellitus with other specified complication: Secondary | ICD-10-CM | POA: Diagnosis not present

## 2019-05-22 DIAGNOSIS — Z880 Allergy status to penicillin: Secondary | ICD-10-CM

## 2019-05-22 DIAGNOSIS — I10 Essential (primary) hypertension: Secondary | ICD-10-CM | POA: Diagnosis present

## 2019-05-22 DIAGNOSIS — E119 Type 2 diabetes mellitus without complications: Secondary | ICD-10-CM

## 2019-05-22 DIAGNOSIS — D631 Anemia in chronic kidney disease: Secondary | ICD-10-CM | POA: Diagnosis not present

## 2019-05-22 DIAGNOSIS — D62 Acute posthemorrhagic anemia: Secondary | ICD-10-CM | POA: Diagnosis not present

## 2019-05-22 DIAGNOSIS — L03115 Cellulitis of right lower limb: Secondary | ICD-10-CM | POA: Diagnosis present

## 2019-05-22 DIAGNOSIS — K8020 Calculus of gallbladder without cholecystitis without obstruction: Secondary | ICD-10-CM | POA: Diagnosis not present

## 2019-05-22 DIAGNOSIS — Z20828 Contact with and (suspected) exposure to other viral communicable diseases: Secondary | ICD-10-CM | POA: Diagnosis not present

## 2019-05-22 DIAGNOSIS — K219 Gastro-esophageal reflux disease without esophagitis: Secondary | ICD-10-CM | POA: Diagnosis present

## 2019-05-22 DIAGNOSIS — B961 Klebsiella pneumoniae [K. pneumoniae] as the cause of diseases classified elsewhere: Secondary | ICD-10-CM | POA: Diagnosis present

## 2019-05-22 DIAGNOSIS — E08621 Diabetes mellitus due to underlying condition with foot ulcer: Secondary | ICD-10-CM

## 2019-05-22 DIAGNOSIS — E785 Hyperlipidemia, unspecified: Secondary | ICD-10-CM | POA: Diagnosis not present

## 2019-05-22 DIAGNOSIS — I96 Gangrene, not elsewhere classified: Secondary | ICD-10-CM

## 2019-05-22 DIAGNOSIS — B9620 Unspecified Escherichia coli [E. coli] as the cause of diseases classified elsewhere: Secondary | ICD-10-CM | POA: Diagnosis present

## 2019-05-22 DIAGNOSIS — Z6831 Body mass index (BMI) 31.0-31.9, adult: Secondary | ICD-10-CM

## 2019-05-22 DIAGNOSIS — R748 Abnormal levels of other serum enzymes: Secondary | ICD-10-CM | POA: Diagnosis not present

## 2019-05-22 DIAGNOSIS — L03119 Cellulitis of unspecified part of limb: Secondary | ICD-10-CM | POA: Diagnosis not present

## 2019-05-22 DIAGNOSIS — E669 Obesity, unspecified: Secondary | ICD-10-CM | POA: Diagnosis present

## 2019-05-22 DIAGNOSIS — E871 Hypo-osmolality and hyponatremia: Secondary | ICD-10-CM | POA: Diagnosis present

## 2019-05-22 DIAGNOSIS — E878 Other disorders of electrolyte and fluid balance, not elsewhere classified: Secondary | ICD-10-CM | POA: Diagnosis present

## 2019-05-22 DIAGNOSIS — Z885 Allergy status to narcotic agent status: Secondary | ICD-10-CM

## 2019-05-22 DIAGNOSIS — Z7982 Long term (current) use of aspirin: Secondary | ICD-10-CM

## 2019-05-22 DIAGNOSIS — R945 Abnormal results of liver function studies: Secondary | ICD-10-CM

## 2019-05-22 DIAGNOSIS — Z89421 Acquired absence of other right toe(s): Secondary | ICD-10-CM

## 2019-05-22 DIAGNOSIS — M868X7 Other osteomyelitis, ankle and foot: Secondary | ICD-10-CM | POA: Diagnosis not present

## 2019-05-22 DIAGNOSIS — N2581 Secondary hyperparathyroidism of renal origin: Secondary | ICD-10-CM | POA: Diagnosis present

## 2019-05-22 DIAGNOSIS — I120 Hypertensive chronic kidney disease with stage 5 chronic kidney disease or end stage renal disease: Secondary | ICD-10-CM | POA: Diagnosis present

## 2019-05-22 DIAGNOSIS — M86171 Other acute osteomyelitis, right ankle and foot: Secondary | ICD-10-CM | POA: Diagnosis not present

## 2019-05-22 DIAGNOSIS — E11628 Type 2 diabetes mellitus with other skin complications: Principal | ICD-10-CM | POA: Diagnosis present

## 2019-05-22 DIAGNOSIS — Z9889 Other specified postprocedural states: Secondary | ICD-10-CM

## 2019-05-22 DIAGNOSIS — R188 Other ascites: Secondary | ICD-10-CM | POA: Diagnosis not present

## 2019-05-22 DIAGNOSIS — L02611 Cutaneous abscess of right foot: Secondary | ICD-10-CM | POA: Diagnosis not present

## 2019-05-22 DIAGNOSIS — Z833 Family history of diabetes mellitus: Secondary | ICD-10-CM

## 2019-05-22 DIAGNOSIS — L97514 Non-pressure chronic ulcer of other part of right foot with necrosis of bone: Secondary | ICD-10-CM

## 2019-05-22 DIAGNOSIS — R7989 Other specified abnormal findings of blood chemistry: Secondary | ICD-10-CM

## 2019-05-22 HISTORY — DX: Cellulitis of right lower limb: L03.115

## 2019-05-22 LAB — COMPREHENSIVE METABOLIC PANEL
ALT: 39 U/L (ref 0–44)
AST: 34 U/L (ref 15–41)
Albumin: 2.6 g/dL — ABNORMAL LOW (ref 3.5–5.0)
Alkaline Phosphatase: 192 U/L — ABNORMAL HIGH (ref 38–126)
Anion gap: 21 — ABNORMAL HIGH (ref 5–15)
BUN: 71 mg/dL — ABNORMAL HIGH (ref 6–20)
CO2: 23 mmol/L (ref 22–32)
Calcium: 9.4 mg/dL (ref 8.9–10.3)
Chloride: 89 mmol/L — ABNORMAL LOW (ref 98–111)
Creatinine, Ser: 13.26 mg/dL — ABNORMAL HIGH (ref 0.61–1.24)
GFR calc Af Amer: 4 mL/min — ABNORMAL LOW (ref >60)
GFR calc non Af Amer: 4 mL/min — ABNORMAL LOW (ref >60)
Glucose, Bld: 105 mg/dL — ABNORMAL HIGH (ref 70–99)
Potassium: 4.5 mmol/L (ref 3.5–5.1)
Sodium: 133 mmol/L — ABNORMAL LOW (ref 135–145)
Total Bilirubin: 0.7 mg/dL (ref 0.3–1.2)
Total Protein: 7.6 g/dL (ref 6.5–8.1)

## 2019-05-22 LAB — CBC
HCT: 38.6 % — ABNORMAL LOW (ref 39.0–52.0)
Hemoglobin: 12 g/dL — ABNORMAL LOW (ref 13.0–17.0)
MCH: 27.6 pg (ref 26.0–34.0)
MCHC: 31.1 g/dL (ref 30.0–36.0)
MCV: 88.7 fL (ref 80.0–100.0)
Platelets: 384 10*3/uL (ref 150–400)
RBC: 4.35 MIL/uL (ref 4.22–5.81)
RDW: 16.5 % — ABNORMAL HIGH (ref 11.5–15.5)
WBC: 12.3 10*3/uL — ABNORMAL HIGH (ref 4.0–10.5)
nRBC: 0 % (ref 0.0–0.2)

## 2019-05-22 LAB — SARS CORONAVIRUS 2 BY RT PCR (HOSPITAL ORDER, PERFORMED IN ~~LOC~~ HOSPITAL LAB): SARS Coronavirus 2: NEGATIVE

## 2019-05-22 LAB — SEDIMENTATION RATE: Sed Rate: 122 mm/hr — ABNORMAL HIGH (ref 0–16)

## 2019-05-22 LAB — C-REACTIVE PROTEIN: CRP: 41.3 mg/dL — ABNORMAL HIGH (ref <1.0)

## 2019-05-22 LAB — LACTIC ACID, PLASMA: Lactic Acid, Venous: 1 mmol/L (ref 0.5–1.9)

## 2019-05-22 MED ORDER — HYDRALAZINE HCL 20 MG/ML IJ SOLN: 5.0000 mg | INTRAMUSCULAR | Status: DC | PRN

## 2019-05-22 MED ORDER — HYDROCODONE-ACETAMINOPHEN 5-325 MG PO TABS
1.0000 | ORAL_TABLET | Freq: Once | ORAL | Status: AC
  Administered 2019-05-22: 1 via ORAL
  Filled 2019-05-22: qty 1

## 2019-05-22 MED ORDER — HYDRALAZINE HCL 20 MG/ML IJ SOLN: 10.0000 mg | INTRAMUSCULAR | Status: DC | PRN

## 2019-05-22 MED ORDER — SODIUM CHLORIDE 0.9 % IV SOLN: 2.0000 g | INTRAVENOUS | Status: DC

## 2019-05-22 MED ORDER — SIMETHICONE 80 MG PO CHEW
80.0000 mg | CHEWABLE_TABLET | Freq: Once | ORAL | Status: AC
  Administered 2019-05-22: 80 mg via ORAL
  Filled 2019-05-22: qty 1

## 2019-05-22 MED ORDER — CLINDAMYCIN PHOSPHATE 600 MG/50ML IV SOLN
600.0000 mg | Freq: Three times a day (TID) | INTRAVENOUS | Status: DC
  Administered 2019-05-22 – 2019-05-23 (×2): 600 mg via INTRAVENOUS
  Filled 2019-05-22 (×2): qty 50

## 2019-05-22 MED ORDER — VANCOMYCIN HCL 10 G IV SOLR
2000.0000 mg | Freq: Once | INTRAVENOUS | Status: AC
  Administered 2019-05-23: 2000 mg via INTRAVENOUS
  Filled 2019-05-22: qty 2000

## 2019-05-22 MED ORDER — INSULIN ASPART 100 UNIT/ML ~~LOC~~ SOLN
0.0000 [IU] | SUBCUTANEOUS | Status: DC
  Administered 2019-05-24 (×2): 2 [IU] via SUBCUTANEOUS
  Administered 2019-05-26 (×2): 1 [IU] via SUBCUTANEOUS

## 2019-05-22 MED ORDER — VANCOMYCIN HCL 10 G IV SOLR
2000.0000 mg | Freq: Once | INTRAVENOUS | Status: DC
  Filled 2019-05-22: qty 2000

## 2019-05-22 MED ORDER — ACETAMINOPHEN 650 MG RE SUPP: 650.0000 mg | Freq: Four times a day (QID) | RECTAL | Status: DC | PRN

## 2019-05-22 MED ORDER — SODIUM CHLORIDE 0.9 % IV SOLN
2.0000 g | INTRAVENOUS | Status: DC
  Administered 2019-05-22 – 2019-05-26 (×5): 2 g via INTRAVENOUS
  Filled 2019-05-22 (×5): qty 20

## 2019-05-22 MED ORDER — SODIUM CHLORIDE 0.9 % IV SOLN
INTRAVENOUS | Status: AC
  Administered 2019-05-22: 22:00:00 via INTRAVENOUS

## 2019-05-22 MED ORDER — DIPHENHYDRAMINE HCL 25 MG PO CAPS
25.0000 mg | ORAL_CAPSULE | Freq: Once | ORAL | Status: AC
  Administered 2019-05-22: 25 mg via ORAL
  Filled 2019-05-22: qty 1

## 2019-05-22 MED ORDER — CLINDAMYCIN PHOSPHATE 600 MG/50ML IV SOLN: 600.0000 mg | Freq: Three times a day (TID) | INTRAVENOUS | Status: DC

## 2019-05-22 MED ORDER — ACETAMINOPHEN 325 MG PO TABS: 650.0000 mg | ORAL_TABLET | Freq: Four times a day (QID) | ORAL | Status: DC | PRN

## 2019-05-22 NOTE — Progress Notes (Signed)
Subjective:  Patient ID: [name], male    DOB: [birth year],  MRN: [id number]  No chief complaint on file.  DOS: 05/14/2019 Procedures:              1) right great toe bone biopsy             2) right great toe amputation interphalangeal joint  53 y.o. male returns for post-op check.  Has been running a fever all day today.  Pain controlled otherwise feeling fine denies nausea vomiting fever chills  Review of Systems: Negative except as noted in the HPI. Denies N/V/F/Ch.  Past Medical History:  Diagnosis Date  . Chills    at night - sometimes  . Complication of anesthesia   . Diabetes mellitus    controlled by diet  . 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 Eczema   . Fatigue    loss of fatigue  . Headache(784.0)    Years ago  . Heart murmur    years ago  . History of blood transfusion   . Hypertension    sees Dr. Jaci Standard  . MRSA (methicillin resistant staph aureus) culture positive   . PONV (postoperative nausea and vomiting)   . Poor circulation   . Renal disorder     Current Outpatient Medications:  .  acetaminophen (TYLENOL) 500 MG tablet, Take 1,000 mg by mouth every 6 (six) hours as needed for moderate pain or headache., Disp: , Rfl:  .  Amino Acids-Protein Hydrolys (FEEDING SUPPLEMENT, PRO-STAT SUGAR FREE 64,) LIQD, Take 30 mLs by mouth 2 (two) times daily., Disp: 887 mL, Rfl: 0 .  aspirin EC 81 MG EC tablet, Take 1 tablet (81 mg total) by mouth daily., Disp: 30 tablet, Rfl: 2 .  atorvastatin (LIPITOR) 40 MG tablet, Take 1 tablet (40 mg total) by mouth daily at 6 PM., Disp: 30 tablet, Rfl: 1 .  blood glucose meter kit and supplies, Dispense based on patient and insurance preference. Use up to four times daily as directed. (FOR ICD-10 E10.9, E11.9)., Disp: 1 each, Rfl: 0 .  blood glucose meter kit and supplies, Dispense based on patient and insurance preference. Use up to four times daily as directed. (FOR ICD-9 250.00, 250.01)., Disp: 1 each, Rfl:  0 .  cephALEXin (KEFLEX) 500 MG capsule, Take 1 capsule (500 mg total) by mouth daily for 14 days. On dialysis days, take after completion of dialysis., Disp: 14 capsule, Rfl: 0 .  clindamycin (CLEOCIN) 300 MG capsule, Take 1 capsule (300 mg total) by mouth 2 (two) times a day., Disp: 14 capsule, Rfl: 0 .  ferric citrate (AURYXIA) 1 GM 210 MG(Fe) tablet, Take 210-630 mg by mouth See admin instructions. Take 420 to 630 mg with each meal depending on the size of the meal, and take 210 mg with each snack, Disp: , Rfl:  .  glucose blood (ACCU-CHEK AVIVA PLUS) test strip, Use as instructed, Disp: 100 each, Rfl: 1 .  HYDROcodone-acetaminophen (NORCO) 10-325 MG tablet, Take 1 tablet by mouth every 8 (eight) hours as needed., Disp: 20 tablet, Rfl: 0 .  Lancet Devices (ACCU-CHEK SOFTCLIX) lancets, Use as instructed, Disp: 100 each, Rfl: 5 .  lidocaine (XYLOCAINE) 2 % jelly, Apply 1 application topically as needed. (Patient not taking: Reported on 05/12/2019), Disp: 30 mL, Rfl: 1 .  loratadine (CLARITIN) 10 MG tablet, Take 10 mg by mouth daily as needed for allergies., Disp: , Rfl:  .  Melatonin 5 MG CAPS, Take 10 mg  by mouth at bedtime as needed (sleep)., Disp: , Rfl:  .  multivitamin (RENA-VIT) TABS tablet, Take 1 tablet by mouth every Monday, Wednesday, and Friday. After dialysis, Disp: , Rfl:   Social History   Tobacco Use  Smoking Status Never Smoker  Smokeless Tobacco Never Used    Allergies  Allergen Reactions  . Amoxicillin Itching    Did it involve swelling of the face/tongue/throat, SOB, or low BP? Unknown Did it involve sudden or severe rash/hives, skin peeling, or any reaction on the inside of your mouth or nose? Unknown Did you need to seek medical attention at a hospital or doctor's office? Unknown When did it last happen?20 years ago If all above answers are "NO", may proceed with cephalosporin use.   Marland Kitchen Hydromorphone Itching    Patient states he may take with benadryl. Makes  feet itch.  Marland Kitchen Percocet [Oxycodone-Acetaminophen] Itching and Other (See Comments)    Legs only. Itching and burning feeling.   Objective:  There were no vitals filed for this visit. There is no height or weight on file to calculate BMI. Constitutional Well developed. Well nourished.  Vascular Foot warm and well perfused. Capillary refill normal to all digits.   Neurologic Normal speech. Oriented to person, place, and time. Epicritic sensation to light touch grossly present bilaterally.  Dermatologic Right second toe surgical site appears with without excessive warmth erythema signs of acute infection skin edges appear viable without gangrenous changes.  Right great toe incision well coapted with intact suture  Orthopedic: Tenderness to palpation noted about the surgical site.   Radiographs: none Assessment:   1. Chronic osteomyelitis of toe of right foot ([place])   2. Osteomyelitis of great toe of right foot ([place])    Plan:  Patient was evaluated and treated and all questions answered.  S/p foot surgery right with chronic osteomyelitis -Progressing as expected post-operatively. -XR: none -WB Status: WBAT in boot -Sutures: intact. -I am concerned today about the patient running a fever.  Patient was concerned about his antibiotic regimen that he was not getting antibiotics at dialysis.  I reviewed his records in the hospital and discussed with him that this was as planned as per ID recommendations.  I am concerned that the antibiotic regimen he is on is not sufficiently controlling the anaerobes that previously were growing from his wound.  I spent considerable time calling dialysis center to see what IV options they had available and compared them to cultures.  Unfortunately there was not a good IV option for which to which we could switch that would offer extended anaerobic coverage.  We could not switch to Augmentin due to allergy. I did discuss with him adding on clindamycin today for  better anaerobic coverage which we sent to his pharmacy. -Medications: Rx clindamycin. -Foot redressed with betadine WTD.  -I advised that he call the on-call number should his fever reach 100.5 or greater, or should he develop systemic signs of infection.  Patient verbalized understanding.  Follow-up in 1 week for recheck  No follow-ups on file.

## 2019-05-22 NOTE — H&P (Signed)
History and Physical    [name] DDU:[id number] DOB: [birth year] DOA: 05/22/2019  PCP: Hayden Rasmussen, MD Patient coming from: Podiatry office  Chief Complaint: Cellulitis of right foot  HPI: [name] is a 53 y.o. male with medical history significant of diet-controlled type 2 diabetes, hypertension, ESRD on HD, obesity, iron deficiency anemia presenting to the hospital as a direct admit by Dr. March Rummage from podiatry.  Status post recent right foot second toe partial amputation for osteomyelitis on 7/25.  He was discharged with a 2-week course of Keflex.  ABIs revealed noncompressible right lower extremity arteries and noncompressible left lower extremity arteries.  Left toe brachial index abnormal.  Patient also underwent one-vessel posterior tibial artery runoff of bilateral foot.  Patient was seen by Dr. March Rummage today and found to have temperature 101.6 F.  Surgical site with delayed healing but without frank purulence.  He had erythema and swelling of the right lower extremity.  Given signs of infection he was sent to the hospital for IV antibiotics and possible surgical debridement.  Patient states he has been having low-grade fevers since he left the hospital.  Last night his temperature went up to 101 F range.  He started noticing erythema and swelling extending up from his foot to his ankle.  He is having some discomfort in his foot from wearing an Haematologist.  States he has not been getting Keflex at dialysis as planned.  States 2 days ago Dr. March Rummage switched him to clindamycin and he has been taking this medication.  Denies any shortness of breath, cough, nausea, vomiting, or abdominal pain.  Review of Systems:  All systems reviewed and apart from history of presenting illness, are negative.  Past Medical History:  Diagnosis Date  . Chills    at night - sometimes  . Complication of anesthesia   . Diabetes mellitus    controlled by diet  . Diabetes with renal manifestations(250.4)  08/23/2013  . Eczema   . Fatigue    loss of fatigue  . Headache(784.0)    Years ago  . Heart murmur    years ago  . History of blood transfusion   . Hypertension    sees Dr. Jaci Standard  . MRSA (methicillin resistant staph aureus) culture positive   . PONV (postoperative nausea and vomiting)   . Poor circulation   . Renal disorder     Past Surgical History:  Procedure Laterality Date  . ABDOMINAL AORTOGRAM N/A 05/17/2019   Procedure: ABDOMINAL AORTOGRAM;  Surgeon: Elam Dutch, MD;  Location: [facility];  Service: Cardiovascular;  Laterality: N/A;  . AMPUTATION TOE Right 05/14/2019   Procedure: PARTIAL AMPUTATION SECOND TOE RIGHT FOOT;  Surgeon: Evelina Bucy, DPM;  Location: [place];  Service: Podiatry;  Laterality: Right;  . AV FISTULA PLACEMENT  06-18-11   Right brachiocephalic AVF  . BONE BIOPSY Right 05/14/2019   Procedure: OPEN SUPERFICIAL BONE BIOPSY GREAT TOE;  Surgeon: Evelina Bucy, DPM;  Location: [place] [facility];  Service: Podiatry;  Laterality: Right;  . EYE SURGERY     left eye for Laser, diabetic retinopathy  . FISTULOGRAM Right 11/06/2011   Procedure: FISTULOGRAM;  Surgeon: Conrad Conover, MD;  Location: [facility];  Service: Cardiovascular;  Laterality: Right;  . HEMATOMA EVACUATION Right Feb. 25, 2014  . HEMATOMA EVACUATION Right 12/14/2012   Procedure: EVACUATION HEMATOMA;  Surgeon: Conrad Purcellville, MD;  Location: [place];  Service: Vascular;  Laterality: Right;  .  INSERTION OF DIALYSIS CATHETER  12/14/2012   Procedure: INSERTION OF DIALYSIS CATHETER;  Surgeon: Conrad Thurston, MD;  Location: [place];  Service: Vascular;;  . LOWER EXTREMITY ANGIOGRAPHY Bilateral 05/17/2019   Procedure: LOWER EXTREMITY ANGIOGRAPHY;  Surgeon: Elam Dutch, MD;  Location: [facility];  Service: Cardiovascular;  Laterality: Bilateral;  . REVISON OF ARTERIOVENOUS FISTULA Right 12/14/2012   Procedure: REVISON OF upper arm ARTERIOVENOUS FISTULA using 41mx10cm gortex graft;  Surgeon: BConrad Allen MD;  [place]  Service: Vascular;  Laterality: Right;  . SOFT TISSUE MASS EXCISION     Right arm, Left leg  for MRSA infection     reports that he has never smoked. He has never used smokeless tobacco. He reports that he does not drink alcohol or use drugs.  Allergies  Allergen Reactions  . Amoxicillin Itching    Did it involve swelling of the face/tongue/throat, SOB, or low BP? Unknown Did it involve sudden or severe rash/hives, skin peeling, or any reaction on the inside of your mouth or nose? Unknown Did you need to seek medical attention at a hospital or doctor's office? Unknown When did it last happen?20 years ago If all above answers are "NO", may proceed with cephalosporin use.   .Marland KitchenHydromorphone Itching    Patient states he may take with benadryl. Makes feet itch.  .Marland KitchenPercocet [Oxycodone-Acetaminophen] Itching and Other (See Comments)    Legs only. Itching and burning feeling.    Family History  Problem Relation Age of Onset  . Diabetes Mother     Prior to Admission medications   Medication Sig Start Date End Date Taking? Authorizing Provider  acetaminophen (TYLENOL) 500 MG tablet Take 1,000 mg by mouth every 6 (six) hours as needed for moderate pain or headache.    [provider]  Amino Acids-Protein Hydrolys (FEEDING SUPPLEMENT, PRO-STAT SUGAR FREE 64,) LIQD Take 30 mLs by mouth 2 (two) times daily. 05/18/19   MCristal Ford DO  aspirin EC 81 MG EC tablet Take 1 tablet (81 mg total) by mouth daily. 05/18/19   Mikhail, MVelta Addison DO  atorvastatin (LIPITOR) 40 MG tablet Take 1 tablet (40 mg total) by mouth daily at 6 PM. 05/18/19   MCristal Ford DO  blood glucose meter kit and supplies Dispense based on patient and insurance preference. Use up to four times daily as directed. (FOR ICD-10 E10.9, E11.9). 05/18/19   Mikhail, MVelta Addison DO  blood glucose meter kit and supplies Dispense based on patient and insurance preference. Use up to four times  daily as directed. (FOR ICD-9 250.00, 250.01). 05/18/19   MCristal Ford DO  cephALEXin (KEFLEX) 500 MG capsule Take 1 capsule (500 mg total) by mouth daily for 14 days. On dialysis days, take after completion of dialysis. 05/18/19 06/01/19  MCristal Ford DO  clindamycin (CLEOCIN) 300 MG capsule Take 1 capsule (300 mg total) by mouth 2 (two) times a day. 05/20/19   PEvelina Bucy DPM  ferric citrate (AURYXIA) 1 GM 210 MG(Fe) tablet Take 210-630 mg by mouth See admin instructions. Take 420 to 630 mg with each meal depending on the size of the meal, and take 210 mg with each snack    [provider]  glucose blood (ACCU-CHEK AVIVA PLUS) test strip Use as instructed 05/18/19   MCristal Ford DO  HYDROcodone-acetaminophen (NORCO) 10-325 MG tablet Take 1 tablet by mouth every 8 (eight) hours as needed. 05/18/19   PEvelina Bucy DPM  Lancet Devices (ACCU-CHEK  SOFTCLIX) lancets Use as instructed 05/18/19   Cristal Ford, DO  lidocaine (XYLOCAINE) 2 % jelly Apply 1 application topically as needed. Patient not taking: Reported on 05/12/2019 12/04/18   Vanessa Kick, MD  loratadine (CLARITIN) 10 MG tablet Take 10 mg by mouth daily as needed for allergies.    [provider]  Melatonin 5 MG CAPS Take 10 mg by mouth at bedtime as needed (sleep).    [provider]  multivitamin (RENA-VIT) TABS tablet Take 1 tablet by mouth every Monday, Wednesday, and Friday. After dialysis    [provider]    Physical Exam: Vitals:   05/22/19 2055  BP: (!) 193/90  Pulse: (!) 106  Temp: (!) 100.5 F (38.1 C)  TempSrc: Oral  SpO2: 100%    Physical Exam  Constitutional: He is oriented to person, place, and time. He appears well-developed and well-nourished. No distress.  HENT:  Head: Normocephalic.  Mouth/Throat: Oropharynx is clear and moist.  Eyes: Right eye exhibits no discharge. Left eye exhibits no discharge.  Neck: Neck supple.  Cardiovascular: Normal rate,  regular rhythm and intact distal pulses.  Pulmonary/Chest: Effort normal and breath sounds normal. No respiratory distress. He has no wheezes. He has no rales.  Abdominal: Soft. Bowel sounds are normal. He exhibits no distension. There is no abdominal tenderness. There is no guarding.  Musculoskeletal:     Comments: Right foot: Great toe appears swollen.  Amputation site of second toe without significant drainage.  Erythema extending from the dorsum of the foot to ankle/lower leg.  Neurological: He is alert and oriented to person, place, and time.  Skin: Skin is warm and dry. He is not diaphoretic.     Labs on Admission: I have personally reviewed following labs and imaging studies  CBC: Recent Labs  Lab 05/16/19 1522 05/17/19 0353 05/18/19 0354 05/18/19 0851  WBC 9.7 9.5 8.7  --   HGB 8.1* 8.9* 7.0* 7.7*  HCT 25.3*  23.9* 28.2* 22.2* 24.4*  MCV 86.6 87.0 87.7  --   PLT 389 362 336  --    Basic Metabolic Panel: Recent Labs  Lab 05/16/19 1522 05/17/19 0353 05/18/19 0354  NA 134* 132* 135  K 5.2* 5.8* 4.7  CL 89* 90* 94*  CO2 _0 GLUCOSE 108* 93 115*  BUN 60* 69* 31*  CREATININE 15.37* 16.62* 9.91*  CALCIUM 8.9 9.0 8.7*  MG 2.7* 2.9* 2.6*  PHOS 7.5*  --  5.5*   GFR: Estimated Creatinine Clearance: 10.1 mL/min (A) (by C-G formula based on SCr of 9.91 mg/dL (H)). Liver Function Tests: Recent Labs  Lab 05/16/19 1522 05/17/19 0353 05/18/19 0354  AST 21 26  --   ALT 18 19  --   ALKPHOS 132* 124  --   BILITOT 0.8 0.9  --   PROT 6.0* 6.8  --   ALBUMIN 2.7* 2.6* 2.2*   No results for input(s): LIPASE, AMYLASE in the last 168 hours. No results for input(s): AMMONIA in the last 168 hours. Coagulation Profile: No results for input(s): INR, PROTIME in the last 168 hours. Cardiac Enzymes: No results for input(s): CKTOTAL, CKMB, CKMBINDEX, TROPONINI in the last 168 hours. BNP (last 3 results) No results for input(s): PROBNP in the last 8760 hours. HbA1C: No  results for input(s): HGBA1C in the last 72 hours. CBG: Recent Labs  Lab 05/17/19 0849 05/17/19 1300 05/17/19 1841 05/17/19 2142 05/18/19 0619  GLUCAP 114* 133* 105* 227* 109*   Lipid Profile: No  results for input(s): CHOL, HDL, LDLCALC, TRIG, CHOLHDL, LDLDIRECT in the last 72 hours. Thyroid Function Tests: No results for input(s): TSH, T4TOTAL, FREET4, T3FREE, THYROIDAB in the last 72 hours. Anemia Panel: No results for input(s): VITAMINB12, FOLATE, FERRITIN, TIBC, IRON, RETICCTPCT in the last 72 hours. Urine analysis:    Component Value Date/Time   COLORURINE YELLOW 06/06/2011 2300   APPEARANCEUR CLOUDY (A) 06/06/2011 2300   LABSPEC 1.021 06/06/2011 2300   PHURINE 5.0 06/06/2011 2300   GLUCOSEU NEGATIVE 06/06/2011 2300   GLUCOSEU > or = 1000 mg/dL (AA) 05/16/2008 1215   HGBUR SMALL (A) 06/06/2011 2300   HGBUR moderate 11/01/2007 0931   BILIRUBINUR NEGATIVE 06/06/2011 2300   KETONESUR NEGATIVE 06/06/2011 2300   PROTEINUR >300 (A) 06/06/2011 2300   UROBILINOGEN 0.2 06/06/2011 2300   NITRITE NEGATIVE 06/06/2011 2300   LEUKOCYTESUR SMALL (A) 06/06/2011 2300    Radiological Exams on Admission: No results found.  Assessment/Plan Principal Problem:   Cellulitis of right foot Active Problems:   Essential hypertension   ESRD on dialysis ([place])   Type 2 diabetes mellitus (HCC)   Right foot cellulitis/ infection in the setting of recent second toe partial amputation for osteomyelitis on 7/25 Patient was seen by Dr. March Rummage today and noted to have erythema and swelling of his right foot.  Sent here as a direct admission for IV antibiotics and possible surgical debridement.  At podiatry office today, noted to have temperature 101.6 F, blood pressure 150/86, and pulse 99. -IV Vancomycin, ceftriaxone, and clindamycin based on patient's allergy profile.  Discussed with pharmacy. -Keep n.p.o. IV fluid hydration. -Tylenol PRN -CBC with differential -Lactic acid -ESR, CRP -Stat  right foot x-ray -Podiatry following, appreciate recommendations -Wound care   Type 2 diabetes Currently diet controlled.  A1c 8.0 on 7/26. -Sliding scale insulin and CBG checks every 4 hours as patient is currently n.p.o.  ESRD on HD MWF No signs of volume overload at this time. -Check electrolytes -Consult nephrology for routine dialysis in a.m.  Hypertension Not on any home medications. -Monitor blood pressure -Hydralazine PRN  Pharmacy medication reconciliation pending.  DVT prophylaxis: SCDs at this time Code Status: Full code Family Communication: Wife at bedside. Disposition Plan: Anticipate discharge after clinical improvement. Consults called: Podiatry Admission status: It is my clinical opinion that admission to INPATIENT is reasonable and necessary in this 53 y.o. male presenting with right foot cellulitis/ infection in the setting of recent second toe partial amputation for osteomyelitis on 7/25.  Needs IV antibiotics and possible surgical debridement.  Given the aforementioned, the predictability of an adverse outcome is felt to be significant. I expect that the patient will require at least 2 midnights in the hospital to treat this condition.   The medical decision making on this patient was of high complexity and the patient is at high risk for clinical deterioration, therefore this is a level 3 visit.  Shela Leff MD [facility] Pager [phone]  If 7PM-7AM, please contact night-coverage [email or web address] Password [email or web address]  05/22/2019, 8:59 PM

## 2019-05-22 NOTE — Progress Notes (Addendum)
I called patient this early evening as a follow-up to our visit this Friday to see how he was doing.  Unbeknownst to me he had just paged to the on-call provider as he has had persistent fevers today.  He states that they have been up to 101.6 as going up and down throughout the day.  He otherwise feels fine other than a bit of swelling in his foot.  I asked him to come to the office for evaluation.  Upon meeting with the office today I checked his temperature and it was 101.6.  Vitals 150/86. Pulse 99.  I removed the dressing. The surgical site does appear to have delayed healing but without frank purulence .  He has erythema and swelling of the right lower extremity.  Given signs of infection I would like to get him admitted to the hospital again for failure of outpatient therapy.  I think he needs to get started again on IV antibiotics and possible surgical debridement.  Patient amenable.  He last ate a protein shake at about 11:30 this morning.  I paged hospitalist on-call for possible admission.  Evelina Bucy, DPM  Addendum: spoke with Dr. Marthenia Rolling who agreed to admit.

## 2019-05-22 NOTE — Progress Notes (Signed)
Pharmacy Antibiotic Note  [name] is a 53 y.o. male admitted on 05/22/2019 with cellulitis.   Pharmacy has been consulted for Vancomycin and Zosyn dosing.  Plan: Rocephin 2g IV q24h Clindamycin 600 mg IV q8h Vancomycin Loading dose 2000 mg per HD protocol Follow nephrology for HD plan and post-HD Vanc dosing  D/c zosyn consult - augmentin allergy per podiatry    Temp (24hrs), Avg:100.5 F (38.1 C), Min:100.5 F (38.1 C), Max:100.5 F (38.1 C)  Recent Labs  Lab 05/16/19 1522 05/17/19 0353 05/18/19 0354  WBC 9.7 9.5 8.7  CREATININE 15.37* 16.62* 9.91*    Estimated Creatinine Clearance: 10.1 mL/min (A) (by C-G formula based on SCr of 9.91 mg/dL (H)).    Allergies  Allergen Reactions  . Amoxicillin Itching    Did it involve swelling of the face/tongue/throat, SOB, or low BP? Unknown Did it involve sudden or severe rash/hives, skin peeling, or any reaction on the inside of your mouth or nose? Unknown Did you need to seek medical attention at a hospital or doctor's office? Unknown When did it last happen?20 years ago If all above answers are "NO", may proceed with cephalosporin use.   Marland Kitchen Hydromorphone Itching    Patient states he may take with benadryl. Makes feet itch.  Marland Kitchen Percocet [Oxycodone-Acetaminophen] Itching and Other (See Comments)    Legs only. Itching and burning feeling.    Antimicrobials this admission:   Dose adjustments this admission:   Microbiology results:   Thank you for allowing pharmacy to be a part of this patient's care.  Lorel Monaco, PharmD PGY1 Ambulatory Care Resident [employer] # [phone]

## 2019-05-22 NOTE — Plan of Care (Signed)
XR reviewed. No SQ air or acute osteo. No urgent intervention tonight. Will plan for surgical debridement, possible conversion to MPJ amputation on Tuesday. Will continue to follow.

## 2019-05-23 ENCOUNTER — Encounter (HOSPITAL_COMMUNITY): Payer: Self-pay

## 2019-05-23 ENCOUNTER — Encounter (HOSPITAL_COMMUNITY)
Admission: AD | Disposition: A | Discharge status: Home Health | Payer: Self-pay | Source: Ambulatory Visit | Attending: Internal Medicine

## 2019-05-23 ENCOUNTER — Other Ambulatory Visit: Payer: Self-pay

## 2019-05-23 DIAGNOSIS — E1129 Type 2 diabetes mellitus with other diabetic kidney complication: Secondary | ICD-10-CM

## 2019-05-23 DIAGNOSIS — L03119 Cellulitis of unspecified part of limb: Secondary | ICD-10-CM

## 2019-05-23 DIAGNOSIS — I10 Essential (primary) hypertension: Secondary | ICD-10-CM

## 2019-05-23 DIAGNOSIS — N186 End stage renal disease: Secondary | ICD-10-CM

## 2019-05-23 DIAGNOSIS — Z992 Dependence on renal dialysis: Secondary | ICD-10-CM

## 2019-05-23 HISTORY — PX: AMPUTATION TOE: SHX6595

## 2019-05-23 HISTORY — PX: INCISION AND DRAINAGE: SHX5863

## 2019-05-23 LAB — COMPREHENSIVE METABOLIC PANEL
ALT: 77 U/L — ABNORMAL HIGH (ref 0–44)
AST: 82 U/L — ABNORMAL HIGH (ref 15–41)
Albumin: 2.2 g/dL — ABNORMAL LOW (ref 3.5–5.0)
Alkaline Phosphatase: 248 U/L — ABNORMAL HIGH (ref 38–126)
Anion gap: 17 — ABNORMAL HIGH (ref 5–15)
BUN: 81 mg/dL — ABNORMAL HIGH (ref 6–20)
CO2: 23 mmol/L (ref 22–32)
Calcium: 8.9 mg/dL (ref 8.9–10.3)
Chloride: 94 mmol/L — ABNORMAL LOW (ref 98–111)
Creatinine, Ser: 14.52 mg/dL — ABNORMAL HIGH (ref 0.61–1.24)
GFR calc Af Amer: 4 mL/min — ABNORMAL LOW (ref >60)
GFR calc non Af Amer: 3 mL/min — ABNORMAL LOW (ref >60)
Glucose, Bld: 144 mg/dL — ABNORMAL HIGH (ref 70–99)
Potassium: 4.7 mmol/L (ref 3.5–5.1)
Sodium: 134 mmol/L — ABNORMAL LOW (ref 135–145)
Total Bilirubin: 1.1 mg/dL (ref 0.3–1.2)
Total Protein: 6.6 g/dL (ref 6.5–8.1)

## 2019-05-23 LAB — CBC WITH DIFFERENTIAL/PLATELET
Abs Immature Granulocytes: 0.17 10*3/uL — ABNORMAL HIGH (ref 0.00–0.07)
Basophils Absolute: 0 10*3/uL (ref 0.0–0.1)
Basophils Relative: 0 %
Eosinophils Absolute: 0.3 10*3/uL (ref 0.0–0.5)
Eosinophils Relative: 2 %
HCT: 23.3 % — ABNORMAL LOW (ref 39.0–52.0)
Hemoglobin: 7.5 g/dL — ABNORMAL LOW (ref 13.0–17.0)
Immature Granulocytes: 1 %
Lymphocytes Relative: 6 %
Lymphs Abs: 0.9 10*3/uL (ref 0.7–4.0)
MCH: 28.2 pg (ref 26.0–34.0)
MCHC: 32.2 g/dL (ref 30.0–36.0)
MCV: 87.6 fL (ref 80.0–100.0)
Monocytes Absolute: 0.7 10*3/uL (ref 0.1–1.0)
Monocytes Relative: 4 %
Neutro Abs: 14.3 10*3/uL — ABNORMAL HIGH (ref 1.7–7.7)
Neutrophils Relative %: 87 %
Platelets: 386 10*3/uL (ref 150–400)
RBC: 2.66 MIL/uL — ABNORMAL LOW (ref 4.22–5.81)
RDW: 16.5 % — ABNORMAL HIGH (ref 11.5–15.5)
WBC: 16.4 10*3/uL — ABNORMAL HIGH (ref 4.0–10.5)
nRBC: 0 % (ref 0.0–0.2)

## 2019-05-23 LAB — PHOSPHORUS: Phosphorus: 7.9 mg/dL — ABNORMAL HIGH (ref 2.5–4.6)

## 2019-05-23 LAB — MAGNESIUM: Magnesium: 2.6 mg/dL — ABNORMAL HIGH (ref 1.7–2.4)

## 2019-05-23 SURGERY — INCISION AND DRAINAGE
Anesthesia: General | Site: Toe | Laterality: Right

## 2019-05-23 MED ORDER — VANCOMYCIN HCL IN DEXTROSE 750-5 MG/150ML-% IV SOLN
INTRAVENOUS | Status: AC
  Administered 2019-05-23: 750 mg via INTRAVENOUS
  Filled 2019-05-23: qty 150

## 2019-05-23 MED ORDER — DIPHENHYDRAMINE HCL 25 MG PO CAPS
25.0000 mg | ORAL_CAPSULE | Freq: Once | ORAL | Status: AC
  Administered 2019-05-23: 25 mg via ORAL
  Filled 2019-05-23: qty 1

## 2019-05-23 MED ORDER — METRONIDAZOLE IN NACL 5-0.79 MG/ML-% IV SOLN
500.0000 mg | Freq: Three times a day (TID) | INTRAVENOUS | Status: DC
  Administered 2019-05-23 – 2019-05-31 (×20): 500 mg via INTRAVENOUS
  Filled 2019-05-23 (×21): qty 100

## 2019-05-23 MED ORDER — VANCOMYCIN HCL IN DEXTROSE 750-5 MG/150ML-% IV SOLN
750.0000 mg | INTRAVENOUS | Status: AC
  Administered 2019-05-23: 750 mg via INTRAVENOUS

## 2019-05-23 MED ORDER — HYDROCODONE-ACETAMINOPHEN 5-325 MG PO TABS
1.0000 | ORAL_TABLET | Freq: Once | ORAL | Status: AC
  Administered 2019-05-23: 1 via ORAL
  Filled 2019-05-23: qty 1

## 2019-05-23 MED ORDER — SUFENTANIL CITRATE 50 MCG/ML IV SOLN
INTRAVENOUS | Status: AC
  Filled 2019-05-23: qty 1

## 2019-05-23 MED ORDER — MIDAZOLAM HCL 2 MG/2ML IJ SOLN
INTRAMUSCULAR | Status: AC
  Filled 2019-05-23: qty 2

## 2019-05-23 MED ORDER — CALCIUM CARBONATE ANTACID 1250 MG/5ML PO SUSP
500.0000 mg | Freq: Four times a day (QID) | ORAL | Status: DC | PRN
  Administered 2019-05-31: 500 mg via ORAL
  Filled 2019-05-23 (×3): qty 5

## 2019-05-23 MED ORDER — CHLORHEXIDINE GLUCONATE CLOTH 2 % EX PADS
6.0000 | MEDICATED_PAD | Freq: Every day | CUTANEOUS | Status: DC
  Administered 2019-05-27 – 2019-05-29 (×2): 6 via TOPICAL

## 2019-05-23 MED ORDER — ALUM & MAG HYDROXIDE-SIMETH 200-200-20 MG/5ML PO SUSP
30.0000 mL | Freq: Four times a day (QID) | ORAL | Status: DC | PRN
  Administered 2019-05-23: 30 mL via ORAL
  Filled 2019-05-23: qty 30

## 2019-05-23 MED ORDER — PROPOFOL 10 MG/ML IV BOLUS
INTRAVENOUS | Status: AC
  Filled 2019-05-23: qty 20

## 2019-05-23 MED ORDER — CHLORHEXIDINE GLUCONATE 4 % EX LIQD
CUTANEOUS | Status: AC
  Administered 2019-05-23: 21:00:00
  Filled 2019-05-23: qty 15

## 2019-05-23 MED ORDER — DIPHENHYDRAMINE HCL 25 MG PO CAPS
25.0000 mg | ORAL_CAPSULE | Freq: Four times a day (QID) | ORAL | Status: DC | PRN
  Administered 2019-05-23 – 2019-05-29 (×12): 25 mg via ORAL
  Filled 2019-05-23 (×11): qty 1

## 2019-05-23 MED ORDER — SODIUM CHLORIDE 0.9 % IV SOLN
INTRAVENOUS | Status: DC | PRN
  Administered 2019-05-23: via INTRAVENOUS

## 2019-05-23 MED ORDER — VANCOMYCIN VARIABLE DOSE PER UNSTABLE RENAL FUNCTION (PHARMACIST DOSING): Status: DC

## 2019-05-23 MED ORDER — SEVELAMER CARBONATE 2.4 G PO PACK
2.4000 g | PACK | Freq: Three times a day (TID) | ORAL | Status: DC
  Administered 2019-05-23 – 2019-05-31 (×11): 2.4 g via ORAL
  Filled 2019-05-23 (×25): qty 1

## 2019-05-23 MED ORDER — 0.9 % SODIUM CHLORIDE (POUR BTL) OPTIME
TOPICAL | Status: DC | PRN
  Administered 2019-05-23: 1000 mL

## 2019-05-23 MED ORDER — HEPARIN SODIUM (PORCINE) 1000 UNIT/ML DIALYSIS: 2000.0000 [IU] | Freq: Once | INTRAMUSCULAR | Status: DC

## 2019-05-23 SURGICAL SUPPLY — 48 items
BNDG ELASTIC 4X5.8 VLCR STR LF (GAUZE/BANDAGES/DRESSINGS) ×1 IMPLANT
BNDG ESMARK 4X9 LF (GAUZE/BANDAGES/DRESSINGS) IMPLANT
BNDG GAUZE ELAST 4 BULKY (GAUZE/BANDAGES/DRESSINGS) ×1 IMPLANT
CHLORAPREP W/TINT 26 (MISCELLANEOUS) ×2 IMPLANT
CONT SPEC STER OR (MISCELLANEOUS) ×1 IMPLANT
COVER SURGICAL LIGHT HANDLE (MISCELLANEOUS) ×3 IMPLANT
COVER WAND RF STERILE (DRAPES) ×2 IMPLANT
CUFF TOURN SGL QUICK 18X4 (TOURNIQUET CUFF) IMPLANT
CUFF TOURN SGL QUICK 34 (TOURNIQUET CUFF)
CUFF TRNQT CYL 34X4.125X (TOURNIQUET CUFF) IMPLANT
DRAPE U-SHAPE 47X51 STRL (DRAPES) ×2 IMPLANT
ELECT CAUTERY BLADE 6.4 (BLADE) ×2 IMPLANT
ELECT REM PT RETURN 9FT ADLT (ELECTROSURGICAL) ×3
ELECTRODE REM PT RTRN 9FT ADLT (ELECTROSURGICAL) ×2 IMPLANT
GAUZE PACKING IODOFORM 1/4X15 (GAUZE/BANDAGES/DRESSINGS) ×1 IMPLANT
GAUZE SPONGE 4X4 12PLY STRL (GAUZE/BANDAGES/DRESSINGS) IMPLANT
GAUZE SPONGE 4X4 12PLY STRL LF (GAUZE/BANDAGES/DRESSINGS) ×2 IMPLANT
GAUZE XEROFORM 5X9 LF (GAUZE/BANDAGES/DRESSINGS) ×1 IMPLANT
GLOVE BIO SURGEON STRL SZ7.5 (GLOVE) ×3 IMPLANT
GLOVE BIOGEL PI IND STRL 8 (GLOVE) ×2 IMPLANT
GLOVE BIOGEL PI INDICATOR 8 (GLOVE) ×3
GOWN STRL REUS W/ TWL LRG LVL3 (GOWN DISPOSABLE) ×2 IMPLANT
GOWN STRL REUS W/ TWL XL LVL3 (GOWN DISPOSABLE) ×2 IMPLANT
GOWN STRL REUS W/TWL LRG LVL3 (GOWN DISPOSABLE) ×1
GOWN STRL REUS W/TWL XL LVL3 (GOWN DISPOSABLE) ×1
HANDPIECE INTERPULSE COAX TIP (DISPOSABLE) ×1
KIT BASIN OR (CUSTOM PROCEDURE TRAY) ×3 IMPLANT
KIT TURNOVER KIT B (KITS) ×3 IMPLANT
MANIFOLD NEPTUNE II (INSTRUMENTS) ×3 IMPLANT
NDL BIOPSY JAMSHIDI 8X6 (NEEDLE) IMPLANT
NDL HYPO 25GX1X1/2 BEV (NEEDLE) IMPLANT
NEEDLE BIOPSY JAMSHIDI 8X6 (NEEDLE) IMPLANT
NEEDLE HYPO 25GX1X1/2 BEV (NEEDLE) ×6 IMPLANT
NS IRRIG 1000ML POUR BTL (IV SOLUTION) ×3 IMPLANT
PACK ORTHO EXTREMITY (CUSTOM PROCEDURE TRAY) ×3 IMPLANT
PAD ARMBOARD 7.5X6 YLW CONV (MISCELLANEOUS) ×5 IMPLANT
PROBE DEBRIDE SONICVAC MISONIX (TIP) IMPLANT
SET CYSTO W/LG BORE CLAMP LF (SET/KITS/TRAYS/PACK) ×2 IMPLANT
SET HNDPC FAN SPRY TIP SCT (DISPOSABLE) IMPLANT
SOL PREP POV-IOD 4OZ 10% (MISCELLANEOUS) ×5 IMPLANT
SUT ETHILON 2 0 FS 18 (SUTURE) ×2 IMPLANT
SWAB COLLECTION DEVICE MRSA (MISCELLANEOUS) ×1 IMPLANT
SWAB CULTURE ESWAB REG 1ML (MISCELLANEOUS) ×1 IMPLANT
SYR CONTROL 10ML LL (SYRINGE) ×2 IMPLANT
TOWEL GREEN STERILE (TOWEL DISPOSABLE) ×3 IMPLANT
TOWEL GREEN STERILE FF (TOWEL DISPOSABLE) ×2 IMPLANT
TUBE CONNECTING 12X1/4 (SUCTIONS) ×3 IMPLANT
YANKAUER SUCT BULB TIP NO VENT (SUCTIONS) ×3 IMPLANT

## 2019-05-23 NOTE — Procedures (Signed)
   I was present at this dialysis session, have reviewed the session itself and made  appropriate changes Kelly Splinter MD [facility] pager [phone]   05/23/2019, 1:46 PM

## 2019-05-23 NOTE — Anesthesia Preprocedure Evaluation (Addendum)
Anesthesia Evaluation  Patient identified by MRN, date of birth, ID band Patient awake    Reviewed: Allergy & Precautions, NPO status , Patient's Chart, lab work & pertinent test results  History of Anesthesia Complications (+) PONV and history of anesthetic complications  Airway Mallampati: II  TM Distance: >3 FB Neck ROM: Full    Dental  (+) Teeth Intact   Pulmonary neg pulmonary ROS,    Pulmonary exam normal        Cardiovascular hypertension, Normal cardiovascular exam     Neuro/Psych  Headaches, negative psych ROS   GI/Hepatic negative GI ROS,  Elevated LFTs    Endo/Other  diabetes, Type 2 Obesity   Renal/GU ESRF and DialysisRenal disease     Musculoskeletal negative musculoskeletal ROS (+)   Abdominal   Peds  Hematology  (+) anemia ,  Leukocytosis    Anesthesia Other Findings   Reproductive/Obstetrics                            Anesthesia Physical Anesthesia Plan  ASA: III and emergent  Anesthesia Plan: MAC   Post-op Pain Management:    Induction: Intravenous  PONV Risk Score and Plan: 2 and Treatment may vary due to age or medical condition, Ondansetron and Midazolam  Airway Management Planned: Natural Airway and Simple Face Mask  Additional Equipment:   Intra-op Plan:   Post-operative Plan:   Informed Consent: I have reviewed the patients History and Physical, chart, labs and discussed the procedure including the risks, benefits and alternatives for the proposed anesthesia with the patient or authorized representative who has indicated his/her understanding and acceptance.       Plan Discussed with: CRNA, Anesthesiologist and Surgeon  Anesthesia Plan Comments:        Anesthesia Quick Evaluation

## 2019-05-23 NOTE — Progress Notes (Signed)
Pharmacy Antibiotic Note  [name] is a 53 y.o. male admitted on 05/22/2019 with cellulitis.   Pharmacy has been consulted for Vancomycin dosing along with Rocephin + Clinda per MD.   The patient was recently admitted and received Vancomycin with only 2 outpatient HD sessions prior to readmission. Concerned for residual Vancomycin on board - will lower the maintenance dose today and check a Vancomycin random in the morning.   Plan: - Vancomycin 750 mg x 1 dose post HD today - No standing Vanc, will get a Vanc random in the morning - Rocephin + Clinda per MD - Will continue to follow HD schedule/duration, culture results, LOT, and antibiotic de-escalation plans    Temp (24hrs), Avg:99.2 F (37.3 C), Min:98.1 F (36.7 C), Max:100.5 F (38.1 C)  Recent Labs  Lab 05/16/19 1522 05/17/19 0353 05/18/19 0354 05/22/19 2016 05/22/19 2046 05/23/19 1139  WBC 9.7 9.5 8.7 12.3*  --  16.4*  CREATININE 15.37* 16.62* 9.91* 13.26*  --  14.52*  LATICACIDVEN  --   --   --   --  1.0  --     Estimated Creatinine Clearance: 7 mL/min (A) (by C-G formula based on SCr of 14.52 mg/dL (H)).    Allergies  Allergen Reactions  . Amoxicillin Itching    Did it involve swelling of the face/tongue/throat, SOB, or low BP? Unknown Did it involve sudden or severe rash/hives, skin peeling, or any reaction on the inside of your mouth or nose? Unknown Did you need to seek medical attention at a hospital or doctor's office? Unknown When did it last happen?20 years ago If all above answers are "NO", may proceed with cephalosporin use.   Marland Kitchen Hydromorphone Itching    Patient states he may take with benadryl. Makes feet itch.  Marland Kitchen Percocet [Oxycodone-Acetaminophen] Itching and Other (See Comments)    Legs only. Itching and burning feeling.   Keflex/Clinda PTA Vanc 7/25 >> 7/27 (last admit); restart 8/3 CTX 8/2 >> Clinda 8/2 >>  7/17 WCx >> E.coli (pan-S) + Strep agalactiae 7/25 WCx (R-toe) >> E.coli, H  influ (B-lactamase neg), B vulgatus (B-lactamase positive)  8/2 COVID >> neg 8/2 BCx >>  Thank you for allowing pharmacy to be a part of this patient's care.  Alycia Rossetti, PharmD, BCPS Clinical Pharmacist Clinical phone for 05/23/2019: [phone] 05/23/2019 2:26 PM   **Pharmacist phone directory can now be found on [email or web address] (PW [email or web address]).  Listed under [place].

## 2019-05-23 NOTE — Progress Notes (Addendum)
Subjective:  Patient ID: [name], male    DOB: 1966/07/29,  MRN: [id number]  Seen at bedside. Underwent emergent I&D last night. Swelling and pain in leg much better today. Objective:   Vitals:   05/23/19 1629 05/23/19 1707  BP: (!) 174/91 (!) 179/86  Pulse: (!) 104 (!) 103  Resp:    Temp:  98.5 F (36.9 C)  SpO2:  96%   General AA&O x3. Normal mood and affect.  Vascular Dorsalis pedis pulse palpable.  PT nonpalpable Right foot warm to touch.  Neurologic Epicritic sensation grossly diminished.  Dermatologic R foot incision and drainage site with ss drainage and without purulence. R foot and leg still edematous, cellulitic.   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   Results for orders placed or performed during the hospital encounter of 05/22/19 (from the past 24 hour(s))  CBC     Status: Abnormal   Collection Time: 05/22/19  8:16 PM  Result Value Ref Range   WBC 12.3 (H) 4.0 - 10.5 K/uL   RBC 4.35 4.22 - 5.81 MIL/uL   Hemoglobin 12.0 (L) 13.0 - 17.0 g/dL   HCT 38.6 (L) 39.0 - 52.0 %   MCV 88.7 80.0 - 100.0 fL   MCH 27.6 26.0 - 34.0 pg   MCHC 31.1 30.0 - 36.0 g/dL   RDW 16.5 (H) 11.5 - 15.5 %   Platelets 384 150 - 400 K/uL   nRBC 0.0 0.0 - 0.2 %  Comprehensive metabolic panel     Status: Abnormal   Collection Time: 05/22/19  8:16 PM  Result Value Ref Range   Sodium 133 (L) 135 - 145 mmol/L   Potassium 4.5 3.5 - 5.1 mmol/L   Chloride 89 (L) 98 - 111 mmol/L   CO2 23 22 - 32 mmol/L   Glucose, Bld 105 (H) 70 - 99 mg/dL   BUN 71 (H) 6 - 20 mg/dL   Creatinine, Ser 13.26 (H) 0.61 - 1.24 mg/dL   Calcium 9.4 8.9 - 10.3 mg/dL   Total Protein 7.6 6.5 - 8.1 g/dL   Albumin 2.6 (L) 3.5 - 5.0 g/dL   AST 34 15 - 41 U/L   ALT 39 0 - 44 U/L   Alkaline Phosphatase 192 (H) 38 - 126 U/L   Total Bilirubin 0.7 0.3 - 1.2 mg/dL   GFR calc non Af Amer 4 (L) >60 mL/min   GFR calc Af Amer 4 (L) >60 mL/min   Anion gap 21 (H) 5 - 15  Lactic acid, plasma     Status: None   Collection Time: 05/22/19  8:46 PM  Result Value Ref Range   Lactic Acid, Venous 1.0 0.5 - 1.9 mmol/L  Sedimentation rate     Status: Abnormal   Collection Time: 05/22/19  8:47 PM  Result Value Ref Range   Sed Rate 122 (H) 0 - 16 mm/hr  C-reactive protein     Status: Abnormal   Collection Time: 05/22/19  8:47 PM  Result Value Ref Range   CRP 41.3 (H) <1.0 mg/dL  SARS Coronavirus 2 [facility] order, Performed in [place] hospital lab)     Status: None   Collection Time: 05/22/19  8:48 PM  Result Value Ref Range   SARS Coronavirus 2 NEGATIVE NEGATIVE  CBC with Differential/Platelet     Status: Abnormal   Collection Time: 05/23/19 11:39 AM  Result Value Ref Range   WBC 16.4 (H) 4.0 - 10.5 K/uL   RBC 2.66 (L) 4.22 - 5.81  MIL/uL   Hemoglobin 7.5 (L) 13.0 - 17.0 g/dL   HCT 23.3 (L) 39.0 - 52.0 %   MCV 87.6 80.0 - 100.0 fL   MCH 28.2 26.0 - 34.0 pg   MCHC 32.2 30.0 - 36.0 g/dL   RDW 16.5 (H) 11.5 - 15.5 %   Platelets 386 150 - 400 K/uL   nRBC 0.0 0.0 - 0.2 %   Neutrophils Relative % 87 %   Neutro Abs 14.3 (H) 1.7 - 7.7 K/uL   Lymphocytes Relative 6 %   Lymphs Abs 0.9 0.7 - 4.0 K/uL   Monocytes Relative 4 %   Monocytes Absolute 0.7 0.1 - 1.0 K/uL   Eosinophils Relative 2 %   Eosinophils Absolute 0.3 0.0 - 0.5 K/uL   Basophils Relative 0 %   Basophils Absolute 0.0 0.0 - 0.1 K/uL   Immature Granulocytes 1 %   Abs Immature Granulocytes 0.17 (H) 0.00 - 0.07 K/uL  Comprehensive metabolic panel     Status: Abnormal   Collection Time: 05/23/19 11:39 AM  Result Value Ref Range   Sodium 134 (L) 135 - 145 mmol/L   Potassium 4.7 3.5 - 5.1 mmol/L   Chloride 94 (L) 98 - 111 mmol/L   CO2 23 22 - 32 mmol/L   Glucose, Bld 144 (H) 70 - 99 mg/dL   BUN 81 (H) 6 - 20 mg/dL   Creatinine, Ser 14.52 (H) 0.61 - 1.24 mg/dL   Calcium 8.9 8.9 - 10.3 mg/dL   Total Protein 6.6 6.5 - 8.1 g/dL   Albumin 2.2 (L) 3.5 - 5.0 g/dL   AST 82 (H) 15 - 41 U/L   ALT 77 (H) 0 - 44 U/L   Alkaline Phosphatase  248 (H) 38 - 126 U/L   Total Bilirubin 1.1 0.3 - 1.2 mg/dL   GFR calc non Af Amer 3 (L) >60 mL/min   GFR calc Af Amer 4 (L) >60 mL/min   Anion gap 17 (H) 5 - 15  Magnesium     Status: Abnormal   Collection Time: 05/23/19 11:39 AM  Result Value Ref Range   Magnesium 2.6 (H) 1.7 - 2.4 mg/dL  Phosphorus     Status: Abnormal   Collection Time: 05/23/19 11:39 AM  Result Value Ref Range   Phosphorus 7.9 (H) 2.5 - 4.6 mg/dL   Results for orders placed or performed during the hospital encounter of 05/22/19  SARS Coronavirus 2 [facility] order, Performed in [place] hospital lab)     Status: None   Collection Time: 05/22/19  8:48 PM  Result Value Ref Range Status   SARS Coronavirus 2 NEGATIVE NEGATIVE Final    Comment: (NOTE) If result is NEGATIVE SARS-CoV-2 target nucleic acids are NOT DETECTED. The SARS-CoV-2 RNA is generally detectable in upper and lower  respiratory specimens during the acute phase of infection. The lowest  concentration of SARS-CoV-2 viral copies this assay can detect is 250  copies / mL. A negative result does not preclude SARS-CoV-2 infection  and should not be used as the sole basis for treatment or other  patient management decisions.  A negative result may occur with  improper specimen collection / handling, submission of specimen other  than nasopharyngeal swab, presence of viral mutation(s) within the  areas targeted by this assay, and inadequate number of viral copies  (<250 copies / mL). A negative result must be combined with clinical  observations, patient history, and epidemiological information. If result is POSITIVE SARS-CoV-2 target nucleic acids are  DETECTED. The SARS-CoV-2 RNA is generally detectable in upper and lower  respiratory specimens dur ing the acute phase of infection.  Positive  results are indicative of active infection with SARS-CoV-2.  Clinical  correlation with patient history and other diagnostic information is  necessary to  determine patient infection status.  Positive results do  not rule out bacterial infection or co-infection with other viruses. If result is PRESUMPTIVE POSTIVE SARS-CoV-2 nucleic acids MAY BE PRESENT.   A presumptive positive result was obtained on the submitted specimen  and confirmed on repeat testing.  While 2019 novel coronavirus  (SARS-CoV-2) nucleic acids may be present in the submitted sample  additional confirmatory testing may be necessary for epidemiological  and / or clinical management purposes  to differentiate between  SARS-CoV-2 and other Sarbecovirus currently known to infect humans.  If clinically indicated additional testing with an alternate test  methodology [phone]) is advised. The SARS-CoV-2 RNA is generally  detectable in upper and lower respiratory sp ecimens during the acute  phase of infection. The expected result is Negative. Fact Sheet for Patients:  StrictlyIdeas.no Fact Sheet for Healthcare Providers: BankingDealers.co.za This test is not yet approved or cleared by the [place] FDA and has been authorized for detection and/or diagnosis of SARS-CoV-2 by FDA under an Emergency Use Authorization (EUA).  This EUA will remain in effect (meaning this test can be used) for the duration of the COVID-19 declaration under Section 564(b)(1) of the Act, 21 U.S.C. section 360bbb-3(b)(1), unless the authorization is terminated or revoked sooner. Performed at [facility], [place] [address] [place], [place] [address]   Aerobic/Anaerobic Culture (surgical/deep wound)     Status: None (Preliminary result)   Collection Time: 05/24/19 12:26 AM   Specimen: Wound; Abscess  Result Value Ref Range Status   Specimen Description WOUND RIGHT FOOT  Final   Special Requests ID A  Final   Gram Stain   Final    NO WBC SEEN RARE GRAM NEGATIVE RODS Performed at [facility], [place] [address], [place], [place] [address]     Culture PENDING  Incomplete   Report Status PENDING  Incomplete    Assessment & Plan:  Patient was evaluated and treated and all questions answered.  Right Foot Cellulitis, Abscess new deep space infection -Labs reviewed. WBC up today but could be 2/2 surgery. Will trend. -Cultures pending. GNRs on GS. -Incision and drainage site without continued purulence.Wound thoroughly flushed and packed with 1/4 in iodoform packing. -To OR tomorrow for repeat debridement and irrigation. Patient understands plan. Discussed with wife as well  Evelina Bucy, DPM  Accessible via secure chat for questions or concerns.

## 2019-05-23 NOTE — Progress Notes (Signed)
PROGRESS NOTE    [name]  FMB:[id number] DOB: [birth year] DOA: 05/22/2019 PCP: Hayden Rasmussen, MD   Brief Narrative:  HPI per Dr. Shela Leff on 05/22/2019 [name] is a 53 y.o. male with medical history significant of diet-controlled type 2 diabetes, hypertension, ESRD on HD, obesity, iron deficiency anemia presenting to the hospital as a direct admit by Dr. [place] Rummage from podiatry.  Status post recent right foot second toe partial amputation for osteomyelitis on 7/25.  He was discharged with a 2-week course of Keflex.  ABIs revealed noncompressible right lower extremity arteries and noncompressible left lower extremity arteries.  Left toe brachial index abnormal.  Patient also underwent one-vessel posterior tibial artery runoff of bilateral foot.  Patient was seen by Dr. [place] Rummage today and found to have temperature 101.6 F.  Surgical site with delayed healing but without frank purulence.  He had erythema and swelling of the right lower extremity.  Given signs of infection he was sent to the hospital for IV antibiotics and possible surgical debridement.  Patient states he has been having low-grade fevers since he left the hospital.  Last night his temperature went up to 101 F range.  He started noticing erythema and swelling extending up from his foot to his ankle.  He is having some discomfort in his foot from wearing an Haematologist.  States he has not been getting Keflex at dialysis as planned.  States 2 days ago Dr. [place] Rummage switched him to clindamycin and he has been taking this medication.  Denies any shortness of breath, cough, nausea, vomiting, or abdominal pain.  **Interim History  Patient's foot is improved per the patient but he is having significant amount of gas due to his lactose intolerance.  Patient is to be dialyzed today and likely will undergo surgical intervention.  Will defer further imaging studies to podiatry and may consider an MRI of the foot if patient does not improve  significantly.  Assessment & Plan:   Principal Problem:   Cellulitis of right foot Active Problems:   Essential hypertension   ESRD on dialysis ([place])   Type 2 diabetes mellitus ([place])    Right Foot Cellulitis/ infection in the setting of recent second toe partial amputation with concern for osteomyelitis on 7/25 -Patient was seen by Dr. [place] Rummage yesterday and noted to have erythema and swelling of his right foot.  Sent here as a direct admission for IV antibiotics and possible surgical debridement.   -At podiatry office today, noted to have temperature 101.6 F, blood pressure 150/86, and pulse 99. -IV Vancomycin, Ceftriaxone, and clindamycin based on patient's allergy profile started on Admission and will continue  -Keep n.p.o. IV fluid hydration now stopped; Was receiving 125 mL/hr x 12 hours -C/w Acetaminophen 650 mg po q6hprn Mild Pain/Fever >/= 101 -CBC with differential showed WBC of 12.3 and worsened to 16.4 -Lactic acid was 1.0 -ESR was 122, CRP was 41.3 -Stat right foot x-ray showed "Prior amputation of the right 2nd toe at the PIP joint. No acute bony abnormality. Specifically, no fracture, subluxation, or dislocation. No bone destruction to suggest osteomyelitis." -Podiatry following, appreciate recommendations -Wound care consult appreciated  -Podiatry recommending no urgent intervention and will Plan for Surgical Debridement and possible coversion to MPJ Ampuation on Tuesday 05/24/2019 -Patient had blood cultures done on 05/14/2019 but none repeated when he presented to the hospital yesterday. -We will obtain blood cultures now; patient's tissue culture of the right second toe grew out rare E. coli, rare  Haemophilus parainfluenza, beta-lactamase negative, few Bacteriodes Vulgatus, and Beta-Lactamase positive with E. coli that was pansensitive  Type 2 Diabetes -Currently diet controlled.  Blood Sugar on CMP this AM was 144 -A1c was 8.0 on 7/26. -C/w Sensitive Novolog SSI q4h for  now; on a Renal/Carb Modified Diet with 1200 mL Fluid Restriction   ESRD on HD MWF Elevated Anion Gap of 17 Hyperphosphatemia and Hypermagnesemia  -No signs of volume overload at this time.  Has a right upper arm fistula -Patient's BUN/Cr went from 31/9.91 -> 71/13.26 -> 81/14.52 -Continue sevelamer carbonate 2.4 g p.o. 3 times daily with meals -Nephrology consulted for Maintenance of Dialysis   Abnormal LFT's -AST went from 34 -> 82 -ALT went from 39 -> 77 -Check RUQ U/S and Acute Hepatitis Panel in AM -Continue to Monitor and Trend Hepatic Fxn Panel -Repeat CMP in AM   Hypertension -Not on any home medications. -Continue to Monitor blood pressure -C/w Hydralazine 5 mg q4hprn for SBP>160 -BP was 146/81 this AM   Normocytic Anemia/Anemia of Chronic Kidney Disease/ESRD -Patient's Hb/Hct on admission was 12.0/38.6 and ? If this was a spurious result and ? Dilutional Drop -Repeat Hgb/Hct was 7.5//23.3 -Check Anemia Panel in the AM -Continue to Monitor for S/Sx of Bleeding; Currently no Overt bleeding noted -Repeat CBC in AM   Obesity -Estimated body mass index is 31.16 kg/m as calculated from the following:   Height as of 05/13/19: _0  (1.778 m).   Weight as of 05/17/19: 98.5 kg. -Weight Loss and Dietary Counseling given  GERD/Reflux/Indigestion -C/w Calcium Carbonate 500 mg po q6hprn Indigestion and Heartburn -Given Simethicone 80 mg po x1  Hyponatremia/Hypochloremia -Patient's Na+ was 133 on admission and Chloride was 89 -Na+ today is 134 and repeat Chloride was 94 -Likely to be corrected in Dialysis -Repeat CMP in AM   DVT prophylaxis: SCDs Code Status: FULL CODE Family Communication: No family present at bedside Disposition Plan: Pending further workup and evaluation by Podiatry   Consultants:   Podiatry   Procedures:  None  Antimicrobials:  Anti-infectives (From admission, onward)   Start     Dose/Rate Route Frequency Ordered Stop   05/22/19 2200   cefTRIAXone (ROCEPHIN) 2 g in sodium chloride 0.9 % 100 mL IVPB  Status:  Discontinued     2 g 200 mL/hr over 30 Minutes Intravenous Every 24 hours 05/22/19 2105 05/22/19 2110   05/22/19 2200  clindamycin (CLEOCIN) IVPB 600 mg  Status:  Discontinued     600 mg 100 mL/hr over 30 Minutes Intravenous Every 8 hours 05/22/19 2105 05/22/19 2110   05/22/19 2200  vancomycin (VANCOCIN) 2,000 mg in sodium chloride 0.9 % 500 mL IVPB  Status:  Discontinued     2,000 mg 250 mL/hr over 120 Minutes Intravenous  Once 05/22/19 2105 05/22/19 2109   05/22/19 2200  vancomycin (VANCOCIN) 2,000 mg in sodium chloride 0.9 % 500 mL IVPB     2,000 mg 250 mL/hr over 120 Minutes Intravenous  Once 05/22/19 2109 05/23/19 0236   05/22/19 2200  clindamycin (CLEOCIN) IVPB 600 mg     600 mg 100 mL/hr over 30 Minutes Intravenous Every 8 hours 05/22/19 2110     05/22/19 2200  cefTRIAXone (ROCEPHIN) 2 g in sodium chloride 0.9 % 100 mL IVPB     2 g 200 mL/hr over 30 Minutes Intravenous Every 24 hours 05/22/19 2110       Subjective: And examined at bedside and states his right foot was extremely swollen and tender  yesterday but is improved today.  No nausea or vomiting.  Passing a lot of indigestion and gas from his breakfast.  No nausea or vomiting.  States he developed a rash from a CHG wipe last night.  No other concerns or complaints at this time.  Objective: Vitals:   05/22/19 2055 05/23/19 0117 05/23/19 0430  BP: (!) 193/90 136/73 (!) 146/81  Pulse: (!) 106 93 92  Resp:  16   Temp: (!) 100.5 F (38.1 C) 99.3 F (37.4 C) 98.8 F (37.1 C)  TempSrc: Oral Oral Oral  SpO2: 100% 93% 96%    Intake/Output Summary (Last 24 hours) at 05/23/2019 0810 Last data filed at 05/22/2019 2200 Gross per 24 hour  Intake 240 ml  Output -  Net 240 ml   There were no vitals filed for this visit.  Examination: Physical Exam:  Constitutional: WN/WD obese AAM in NAD and appears calm but slightly uncomfortable Eyes: Lids and  conjunctivae normal, sclerae anicteric  ENMT: External Ears, Nose appear normal. Grossly normal hearing. Mucous membranes are moist.  Neck: Appears normal, supple, no cervical masses, normal ROM, no appreciable thyromegaly; no JVD Respiratory: Diminished to auscultation bilaterally, no wheezing, rales, rhonchi or crackles. Normal respiratory effort and patient is not tachypenic. No accessory muscle use.  Cardiovascular: RRR but slightly on the faster side, no murmurs / rubs / gallops. S1 and S2 auscultated. 1+ LE extremity edema.  Abdomen: Soft, non-tender, Distended 2/2 body habitus. No masses palpated. No appreciable hepatosplenomegaly. Bowel sounds positive x4.  GU: Deferred. Musculoskeletal: No clubbing / cyanosis of digits/nails. Has a Right Upper Arm AVF with a palpable Thrill and Auscultated Bruit Skin: Right 2/2 Toe with Dry gangrene and Foot with Cellulitic appearance with warmth and erythema Neurologic: CN 2-12 grossly intact with no focal deficits. Romberg sign and cerebellar reflexes not assessed.  Psychiatric: Normal judgment and insight. Alert and oriented x 3. Slightly Anxious mood and appropriate affect.   Data Reviewed: I have personally reviewed following labs and imaging studies  CBC: Recent Labs  Lab 05/16/19 1522 05/17/19 0353 05/18/19 0354 05/18/19 0851 05/22/19 2016  WBC 9.7 9.5 8.7  --  12.3*  HGB 8.1* 8.9* 7.0* 7.7* 12.0*  HCT 25.3*  23.9* 28.2* 22.2* 24.4* 38.6*  MCV 86.6 87.0 87.7  --  88.7  PLT 389 362 336  --  903   Basic Metabolic Panel: Recent Labs  Lab 05/16/19 1522 05/17/19 0353 05/18/19 0354 05/22/19 2016  NA 134* 132* 135 133*  K 5.2* 5.8* 4.7 4.5  CL 89* 90* 94* 89*  CO2 _0 GLUCOSE 108* 93 115* 105*  BUN 60* 69* 31* 71*  CREATININE 15.37* 16.62* 9.91* 13.26*  CALCIUM 8.9 9.0 8.7* 9.4  MG 2.7* 2.9* 2.6*  --   PHOS 7.5*  --  5.5*  --    GFR: Estimated Creatinine Clearance: 7.6 mL/min (A) (by C-G formula based on SCr of 13.26  mg/dL (H)). Liver Function Tests: Recent Labs  Lab 05/16/19 1522 05/17/19 0353 05/18/19 0354 05/22/19 2016  AST 21 26  --  34  ALT 18 19  --  39  ALKPHOS 132* 124  --  192*  BILITOT 0.8 0.9  --  0.7  PROT 6.0* 6.8  --  7.6  ALBUMIN 2.7* 2.6* 2.2* 2.6*   No results for input(s): LIPASE, AMYLASE in the last 168 hours. No results for input(s): AMMONIA in the last 168 hours. Coagulation Profile: No results for input(s): INR, PROTIME  in the last 168 hours. Cardiac Enzymes: No results for input(s): CKTOTAL, CKMB, CKMBINDEX, TROPONINI in the last 168 hours. BNP (last 3 results) No results for input(s): PROBNP in the last 8760 hours. HbA1C: No results for input(s): HGBA1C in the last 72 hours. CBG: Recent Labs  Lab 05/17/19 0849 05/17/19 1300 05/17/19 1841 05/17/19 2142 05/18/19 0619  GLUCAP 114* 133* 105* 227* 109*   Lipid Profile: No results for input(s): CHOL, HDL, LDLCALC, TRIG, CHOLHDL, LDLDIRECT in the last 72 hours. Thyroid Function Tests: No results for input(s): TSH, T4TOTAL, FREET4, T3FREE, THYROIDAB in the last 72 hours. Anemia Panel: No results for input(s): VITAMINB12, FOLATE, FERRITIN, TIBC, IRON, RETICCTPCT in the last 72 hours. Sepsis Labs: Recent Labs  Lab 05/22/19 2046  LATICACIDVEN 1.0    Recent Results (from the past 240 hour(s))  SARS Coronavirus 2 (Performed in [place] hospital lab)     Status: None   Collection Time: 05/13/19  1:00 PM   Specimen: Nasal Swab  Result Value Ref Range Status   SARS Coronavirus 2 NEGATIVE NEGATIVE Final    Comment: (NOTE) SARS-CoV-2 target nucleic acids are NOT DETECTED. The SARS-CoV-2 RNA is generally detectable in upper and lower respiratory specimens during the acute phase of infection. Negative results do not preclude SARS-CoV-2 infection, do not rule out co-infections with other pathogens, and should not be used as the sole basis for treatment or other patient management decisions. Negative results must  be combined with clinical observations, patient history, and epidemiological information. The expected result is Negative. Fact Sheet for Patients: SugarRoll.be Fact Sheet for Healthcare Providers: https://www.woods-mathews.com/ This test is not yet approved or cleared by the [place] FDA and  has been authorized for detection and/or diagnosis of SARS-CoV-2 by FDA under an Emergency Use Authorization (EUA). This EUA will remain  in effect (meaning this test can be used) for the duration of the COVID-19 declaration under Section 56 4(b)(1) of the Act, 21 U.S.C. section 360bbb-3(b)(1), unless the authorization is terminated or revoked sooner. Performed at [facility], [place] [address], [place], [place] [address]   SARS Coronavirus 2 (CEPHEID - Performed in [place] hospital lab), Hosp Order     Status: None   Collection Time: 05/14/19  2:32 AM   Specimen: Nasopharyngeal Swab  Result Value Ref Range Status   SARS Coronavirus 2 NEGATIVE NEGATIVE Final    Comment: (NOTE) If result is NEGATIVE SARS-CoV-2 target nucleic acids are NOT DETECTED. The SARS-CoV-2 RNA is generally detectable in upper and lower  respiratory specimens during the acute phase of infection. The lowest  concentration of SARS-CoV-2 viral copies this assay can detect is 250  copies / mL. A negative result does not preclude SARS-CoV-2 infection  and should not be used as the sole basis for treatment or other  patient management decisions.  A negative result may occur with  improper specimen collection / handling, submission of specimen other  than nasopharyngeal swab, presence of viral mutation(s) within the  areas targeted by this assay, and inadequate number of viral copies  (<250 copies / mL). A negative result must be combined with clinical  observations, patient history, and epidemiological information. If result is POSITIVE SARS-CoV-2 target nucleic acids are  DETECTED. The SARS-CoV-2 RNA is generally detectable in upper and lower  respiratory specimens dur ing the acute phase of infection.  Positive  results are indicative of active infection with SARS-CoV-2.  Clinical  correlation with patient history and other diagnostic information is  necessary to determine patient infection status.  Positive results do  not rule out bacterial infection or co-infection with other viruses. If result is PRESUMPTIVE POSTIVE SARS-CoV-2 nucleic acids MAY BE PRESENT.   A presumptive positive result was obtained on the submitted specimen  and confirmed on repeat testing.  While 2019 novel coronavirus  (SARS-CoV-2) nucleic acids may be present in the submitted sample  additional confirmatory testing may be necessary for epidemiological  and / or clinical management purposes  to differentiate between  SARS-CoV-2 and other Sarbecovirus currently known to infect humans.  If clinically indicated additional testing with an alternate test  methodology [phone]) is advised. The SARS-CoV-2 RNA is generally  detectable in upper and lower respiratory sp ecimens during the acute  phase of infection. The expected result is Negative. Fact Sheet for Patients:  StrictlyIdeas.no Fact Sheet for Healthcare Providers: BankingDealers.co.za This test is not yet approved or cleared by the [place] FDA and has been authorized for detection and/or diagnosis of SARS-CoV-2 by FDA under an Emergency Use Authorization (EUA).  This EUA will remain in effect (meaning this test can be used) for the duration of the COVID-19 declaration under Section 564(b)(1) of the Act, 21 U.S.C. section 360bbb-3(b)(1), unless the authorization is terminated or revoked sooner. Performed at [facility], [place] [address], [place], [place] [address]   Blood culture (routine x 2)     Status: None   Collection Time: 05/14/19  2:48 AM   Specimen: BLOOD   Result Value Ref Range Status   Specimen Description BLOOD LEFT ARM  Final   Special Requests   Final    BOTTLES DRAWN AEROBIC AND ANAEROBIC Blood Culture results may not be optimal due to an inadequate volume of blood received in culture bottles   Culture   Final    NO GROWTH 5 DAYS Performed at [facility], [place] [address], [place], [place] [address]    Report Status 05/19/2019 FINAL  Final  Blood culture (routine x 2)     Status: None   Collection Time: 05/14/19  2:55 AM   Specimen: BLOOD LEFT HAND  Result Value Ref Range Status   Specimen Description BLOOD LEFT HAND  Final   Special Requests   Final    BOTTLES DRAWN AEROBIC AND ANAEROBIC Blood Culture adequate volume   Culture   Final    NO GROWTH 5 DAYS Performed at [facility], [place] [address] [place], [place] [address]    Report Status 05/19/2019 FINAL  Final  MRSA PCR Screening     Status: None   Collection Time: 05/14/19  5:29 AM   Specimen: Nasal Mucosa; Nasopharyngeal  Result Value Ref Range Status   MRSA by PCR NEGATIVE NEGATIVE Final    Comment:        The GeneXpert MRSA Assay (FDA approved for NASAL specimens only), is one component of a comprehensive MRSA colonization surveillance program. It is not intended to diagnose MRSA infection nor to guide or monitor treatment for MRSA infections. Performed at [facility], [place] [address] [place], [place] [address]   Aerobic/Anaerobic Culture (surgical/deep wound)     Status: None   Collection Time: 05/14/19 11:20 AM   Specimen: Bone; Tissue  Result Value Ref Range Status   Specimen Description BONE 1ST DISTAL PHALANX  Final   Special Requests NONE  Final   Gram Stain   Final    RARE WBC PRESENT, PREDOMINANTLY PMN NO ORGANISMS SEEN  Culture   Final    No growth aerobically or anaerobically. Performed at [facility], [place] [address] [place], [place] [address]    Report Status 05/19/2019 FINAL  Final  Aerobic/Anaerobic Culture  (surgical/deep wound)     Status: None   Collection Time: 05/14/19 11:21 AM   Specimen: Soft Tissue, Other  Result Value Ref Range Status   Specimen Description TISSUE RIGHT TOE 2ND  Final   Special Requests NONE  Final   Gram Stain   Final    RARE WBC PRESENT, PREDOMINANTLY PMN RARE GRAM NEGATIVE RODS    Culture   Final    RARE ESCHERICHIA COLI RARE HAEMOPHILUS PARAINFLUENZAE BETA LACTAMASE NEGATIVE FEW BACTEROIDES VULGATUS BETA LACTAMASE POSITIVE Performed at [facility], [place] [address], [place], [place] [address]    Report Status 05/17/2019 FINAL  Final   Organism ID, Bacteria ESCHERICHIA COLI  Final      Susceptibility   Escherichia coli - MIC*    AMPICILLIN 8 SENSITIVE Sensitive     CEFAZOLIN <=4 SENSITIVE Sensitive     CEFEPIME <=1 SENSITIVE Sensitive     CEFTAZIDIME <=1 SENSITIVE Sensitive     CEFTRIAXONE <=1 SENSITIVE Sensitive     CIPROFLOXACIN <=0.25 SENSITIVE Sensitive     GENTAMICIN <=1 SENSITIVE Sensitive     IMIPENEM <=0.25 SENSITIVE Sensitive     TRIMETH/SULFA <=20 SENSITIVE Sensitive     AMPICILLIN/SULBACTAM 4 SENSITIVE Sensitive     PIP/TAZO <=4 SENSITIVE Sensitive     Extended ESBL NEGATIVE Sensitive     * RARE ESCHERICHIA COLI  SARS Coronavirus 2 [facility] order, Performed in [place] hospital lab)     Status: None   Collection Time: 05/22/19  8:48 PM  Result Value Ref Range Status   SARS Coronavirus 2 NEGATIVE NEGATIVE Final    Comment: (NOTE) If result is NEGATIVE SARS-CoV-2 target nucleic acids are NOT DETECTED. The SARS-CoV-2 RNA is generally detectable in upper and lower  respiratory specimens during the acute phase of infection. The lowest  concentration of SARS-CoV-2 viral copies this assay can detect is 250  copies / mL. A negative result does not preclude SARS-CoV-2 infection  and should not be used as the sole basis for treatment or other  patient management decisions.  A negative result may occur with  improper specimen  collection / handling, submission of specimen other  than nasopharyngeal swab, presence of viral mutation(s) within the  areas targeted by this assay, and inadequate number of viral copies  (<250 copies / mL). A negative result must be combined with clinical  observations, patient history, and epidemiological information. If result is POSITIVE SARS-CoV-2 target nucleic acids are DETECTED. The SARS-CoV-2 RNA is generally detectable in upper and lower  respiratory specimens dur ing the acute phase of infection.  Positive  results are indicative of active infection with SARS-CoV-2.  Clinical  correlation with patient history and other diagnostic information is  necessary to determine patient infection status.  Positive results do  not rule out bacterial infection or co-infection with other viruses. If result is PRESUMPTIVE POSTIVE SARS-CoV-2 nucleic acids MAY BE PRESENT.   A presumptive positive result was obtained on the submitted specimen  and confirmed on repeat testing.  While 2019 novel coronavirus  (SARS-CoV-2) nucleic acids may be present in the submitted sample  additional confirmatory testing may be necessary for epidemiological  and / or clinical management purposes  to differentiate between  SARS-CoV-2 and other Sarbecovirus currently known to infect  humans.  If clinically indicated additional testing with an alternate test  methodology [phone]) is advised. The SARS-CoV-2 RNA is generally  detectable in upper and lower respiratory sp ecimens during the acute  phase of infection. The expected result is Negative. Fact Sheet for Patients:  StrictlyIdeas.no Fact Sheet for Healthcare Providers: BankingDealers.co.za This test is not yet approved or cleared by the [place] FDA and has been authorized for detection and/or diagnosis of SARS-CoV-2 by FDA under an Emergency Use Authorization (EUA).  This EUA will remain in effect  (meaning this test can be used) for the duration of the COVID-19 declaration under Section 564(b)(1) of the Act, 21 U.S.C. section 360bbb-3(b)(1), unless the authorization is terminated or revoked sooner. Performed at [facility], [place] [address] [place], [place] [address]     Radiology Studies: Dg Foot 2 Views Right  Result Date: 05/22/2019 CLINICAL DATA:  Diabetic ulcer of right foot EXAM: RIGHT FOOT - 2 VIEW COMPARISON:  05/14/2019 FINDINGS: Prior amputation of the right 2nd toe at the PIP joint. No acute bony abnormality. Specifically, no fracture, subluxation, or dislocation. No bone destruction to suggest osteomyelitis. IMPRESSION: Prior right toe amputation.  No acute bony abnormality. Electronically Signed   By: Rolm Baptise M.D.   On: 05/22/2019 23:20   Scheduled Meds: . Chlorhexidine Gluconate Cloth  6 each Topical Q0600  . insulin aspart  0-9 Units Subcutaneous Q4H   Continuous Infusions: . sodium chloride 125 mL/hr at 05/22/19 2217  . cefTRIAXone (ROCEPHIN)  IV 2 g (05/22/19 2222)  . clindamycin (CLEOCIN) IV 600 mg (05/23/19 0603)    LOS: 1 day   Kerney Elbe, DO [facility] PAGER is on [place]  If 7PM-7AM, please contact night-coverage [email or web address] Password [email or web address] 05/23/2019, 8:10 AM

## 2019-05-23 NOTE — Progress Notes (Signed)
Pt transported to OR via bed. Pt stable.

## 2019-05-23 NOTE — Consult Note (Addendum)
[facility] Renal Consultation Note    Indication for Consultation:  Management of ESRD/hemodialysis, anemia, hypertension/volume, and secondary hyperparathyroidism.  HPI: [name] is a 53 y.o. male with a history of ESRD on dialysis, DM, and HTN who prevented to the ED on 8/2 for R foot swelling and fevers. He was evaluated by podiatry on 7/17 and noted to have R 1st and 2nd toe ulcerations. Xray showed osteolysis of distal 1st/2nd phalanx. This wound was debrided and plan was for home health dressing changes. On 7/24, he returned for f/u appointment. Toe was worse in appearance with odor, and he was noted to be febrile and tachycardic -> he was sent to the ED for admission and planned amputation. Patient was previously admitted for osteomyelitis and underwent R second toe partial amputation on 7/25. He was initially discharged on keflex, then reportedly changed to clindamycin by Dr. March Rummage. However, he continued to experience low grade fevers and erythema/swelling of the R foot. T 101/6 on admission. Patient was started on vancomycin, ceftriaxone, and clindamycin. Per podiatry, plan for surgical debridement and possible MPJ amputation on Tuesday.  Patient normally dialyzes MWF at [facility]. He reports he did attend his regular dialysis on Friday but treatment was 30 minutes short due to clotting. He denies SOB, dyspnea, CP, palpitations, N/V/D. Reports he has some pressure in his abdomen from gas (reportedly ate butter accidentally this AM), but otherwise no abdominal pain. He feels the swelling in his foot is significantly improved. Wound care nurse currently present and wrapping foot.   Past Medical History:  Diagnosis Date  . Chills    at night - sometimes  . Complication of anesthesia   . Diabetes mellitus    controlled by diet  . 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 Eczema   . Fatigue    loss of fatigue  . Headache(784.0)    Years  ago  . Heart murmur    years ago  . History of blood transfusion   . Hypertension    sees Dr. Jaci Standard  . MRSA (methicillin resistant staph aureus) culture positive   . PONV (postoperative nausea and vomiting)   . Poor circulation   . Renal disorder    Past Surgical History:  Procedure Laterality Date  . ABDOMINAL AORTOGRAM N/A 05/17/2019   Procedure: ABDOMINAL AORTOGRAM;  Surgeon: Elam Dutch, MD;  Location: [place] [facility];  Service: Cardiovascular;  Laterality: N/A;  . AMPUTATION TOE Right 05/14/2019   Procedure: PARTIAL AMPUTATION SECOND TOE RIGHT FOOT;  Surgeon: Evelina Bucy, DPM;  Location: [place];  Service: Podiatry;  Laterality: Right;  . AV FISTULA PLACEMENT  06-18-11   Right brachiocephalic AVF  . BONE BIOPSY Right 05/14/2019   Procedure: OPEN SUPERFICIAL BONE BIOPSY GREAT TOE;  Surgeon: Evelina Bucy, DPM;  Location: [place];  Service: Podiatry;  Laterality: Right;  . EYE SURGERY     left eye for Laser, diabetic retinopathy  . FISTULOGRAM Right 11/06/2011   Procedure: FISTULOGRAM;  Surgeon: Conrad Milwaukee, MD;  Location: [facility];  Service: Cardiovascular;  Laterality: Right;  . HEMATOMA EVACUATION Right Feb. 25, 2014  . HEMATOMA EVACUATION Right 12/14/2012   Procedure: EVACUATION HEMATOMA;  Surgeon: Conrad Chapin, MD;  Location: [place];  Service: Vascular;  Laterality: Right;  . INSERTION OF DIALYSIS CATHETER  12/14/2012   Procedure: INSERTION OF DIALYSIS CATHETER;  Surgeon: Conrad Bandon, MD;  Location: [place];  Service: Vascular;;  .  LOWER EXTREMITY ANGIOGRAPHY Bilateral 05/17/2019   Procedure: LOWER EXTREMITY ANGIOGRAPHY;  Surgeon: Elam Dutch, MD;  Location: [name] [facility];  Service: Cardiovascular;  Laterality: Bilateral;  . REVISON OF ARTERIOVENOUS FISTULA Right 12/14/2012   Procedure: REVISON OF upper arm ARTERIOVENOUS FISTULA using 17mx10cm gortex graft;  Surgeon: BConrad Sherrill MD;  [place]  Service: Vascular;  Laterality: Right;  . SOFT TISSUE  MASS EXCISION     Right arm, Left leg  for MRSA infection   Family History  Problem Relation Age of Onset  . Diabetes Mother    Social History:  reports that he has never smoked. He has never used smokeless tobacco. He reports that he does not drink alcohol or use drugs.  ROS: As per HPI otherwise negative.   Physical Exam: Vitals:   05/22/19 2055 05/23/19 0117 05/23/19 0430  BP: (!) 193/90 136/73 (!) 146/81  Pulse: (!) 106 93 92  Resp:  16   Temp: (!) 100.5 F (38.1 C) 99.3 F (37.4 C) 98.8 F (37.1 C)  TempSrc: Oral Oral Oral  SpO2: 100% 93% 96%     General: Well developed, well nourished, in no acute distress. Head: Normocephalic, atraumatic, sclera non-icteric, mucus membranes are moist. Neck:  JVD not elevated. Lungs: Clear bilaterally to auscultation without wheezes, rales, or rhonchi. Breathing is unlabored. Heart: RRR with normal S1, S2. No murmurs, rubs, or gallops appreciated. Abdomen: Soft, non-tender ("pressure" on palpation), non-distended with normoactive bowel sounds. No rebound/guarding. No obvious abdominal masses. Musculoskeletal:  Strength and tone appear normal for age. Lower extremities: 1+ pedal edema R foot, erythema R second toe partial amputation Neuro: Alert and oriented X 3. Moves all extremities spontaneously. Psych:  Responds to questions appropriately with a normal affect. Dialysis Access: RUE AVF + thrill   Allergies  Allergen Reactions  . Amoxicillin Itching    Did it involve swelling of the face/tongue/throat, SOB, or low BP? Unknown Did it involve sudden or severe rash/hives, skin peeling, or any reaction on the inside of your mouth or nose? Unknown Did you need to seek medical attention at a hospital or doctor's office? Unknown When did it last happen?20 years ago If all above answers are "NO", may proceed with cephalosporin use.   .Marland KitchenHydromorphone Itching    Patient states he may take with benadryl. Makes feet itch.  .Marland KitchenPercocet  [Oxycodone-Acetaminophen] Itching and Other (See Comments)    Legs only. Itching and burning feeling.   Prior to Admission medications   Medication Sig Start Date End Date Taking? Authorizing Provider  acetaminophen (TYLENOL) 500 MG tablet Take 1,000 mg by mouth every 6 (six) hours as needed for moderate pain or headache.   Yes [provider]  cephALEXin (KEFLEX) 500 MG capsule Take 1 capsule (500 mg total) by mouth daily for 14 days. On dialysis days, take after completion of dialysis. Patient taking differently: Take 500 mg by mouth See admin instructions. Take one capsule (500 mg) by mouth on Monday, Wednesday, Friday after completion of dialysis 05/18/19 06/01/19 Yes Mikhail, [place] DO  clindamycin (CLEOCIN) 300 MG capsule Take 1 capsule (300 mg total) by mouth 2 (two) times a day. 05/20/19  Yes PEvelina Bucy DPM  ferric citrate (AURYXIA) 1 GM 210 MG(Fe) tablet Take 210-630 mg by mouth See admin instructions. Take 3 tablets (630 mg) by mouth up to three times daily with meals, take 1 tablet (210 mg) with snacks   Yes [provider]  HYDROcodone-acetaminophen (NMoclips 10-325  MG tablet Take 1 tablet by mouth every 8 (eight) hours as needed. Patient taking differently: Take 1 tablet by mouth every 8 (eight) hours as needed (pain).  05/18/19  Yes Evelina Bucy, DPM  Lanthanum Carbonate (FOSRENOL) 1000 MG PACK Take 1,000 mg by mouth 3 (three) times daily with meals.    Yes [provider]  loratadine (CLARITIN) 10 MG tablet Take 10 mg by mouth daily as needed for allergies.   Yes [provider]  Melatonin 5 MG CAPS Take 10 mg by mouth at bedtime as needed (sleep).   Yes [provider]  multivitamin (RENA-VIT) TABS tablet Take 1 tablet by mouth See admin instructions. Take one tablet by mouth on Monday, Wednesday, Friday after dialysis   Yes [provider]  OVER THE COUNTER MEDICATION Take 1 Bottle by mouth 2 (two) times a day. Vegan  Protein Shake   Yes [provider]  Amino Acids-Protein Hydrolys (FEEDING SUPPLEMENT, PRO-STAT SUGAR FREE 64,) LIQD Take 30 mLs by mouth 2 (two) times daily. Patient not taking: Reported on 05/22/2019 05/18/19   Cristal Ford, DO  aspirin EC 81 MG EC tablet Take 1 tablet (81 mg total) by mouth daily. Patient not taking: Reported on 05/22/2019 05/18/19   Cristal Ford, DO  atorvastatin (LIPITOR) 40 MG tablet Take 1 tablet (40 mg total) by mouth daily at 6 PM. Patient not taking: Reported on 05/22/2019 05/18/19   Cristal Ford, DO  blood glucose meter kit and supplies Dispense based on patient and insurance preference. Use up to four times daily as directed. (FOR ICD-10 E10.9, E11.9). 05/18/19   Mikhail, Velta Addison, DO  blood glucose meter kit and supplies Dispense based on patient and insurance preference. Use up to four times daily as directed. (FOR ICD-9 250.00, 250.01). 05/18/19   Cristal Ford, DO  glucose blood (ACCU-CHEK AVIVA PLUS) test strip Use as instructed 05/18/19   Cristal Ford, DO  Lancet Devices ([place]) lancets Use as instructed 05/18/19   Cristal Ford, DO  lidocaine (XYLOCAINE) 2 % jelly Apply 1 application topically as needed. Patient not taking: Reported on 05/12/2019 12/04/18   Vanessa Kick, MD   Current Facility-Administered Medications  Medication Dose Route Frequency Provider Last Rate Last Dose  . acetaminophen (TYLENOL) tablet 650 mg  650 mg Oral Q6H PRN Shela Leff, MD       Or  . acetaminophen (TYLENOL) suppository 650 mg  650 mg Rectal Q6H PRN Shela Leff, MD      . alum & mag hydroxide-simeth (MAALOX/MYLANTA) 200-200-20 MG/5ML suspension 30 mL  30 mL Oral Q6H PRN Raiford Noble Latif, DO   30 mL at 05/23/19 1013  . cefTRIAXone (ROCEPHIN) 2 g in sodium chloride 0.9 % 100 mL IVPB  2 g Intravenous Q24H Shela Leff, MD 200 mL/hr at 05/22/19 2222 2 g at 05/22/19 2222  . Chlorhexidine Gluconate Cloth 2 % PADS 6 each  6 each  Topical Q0600 Roney Jaffe, MD      . clindamycin (CLEOCIN) IVPB 600 mg  600 mg Intravenous Quay Burow, MD 100 mL/hr at 05/23/19 0603 600 mg at 05/23/19 0603  . diphenhydrAMINE (BENADRYL) capsule 25 mg  25 mg Oral Q6H PRN Raiford Noble [place], DO   25 mg at 05/23/19 0913  . hydrALAZINE (APRESOLINE) injection 5 mg  5 mg Intravenous Q4H PRN Shela Leff, MD      . insulin aspart (novoLOG) injection 0-9 Units  0-9 Units Subcutaneous Q4H Shela Leff, MD  Labs: Basic Metabolic Panel: Recent Labs  Lab 05/16/19 1522 05/17/19 0353 05/18/19 0354 05/22/19 2016  NA 134* 132* 135 133*  K 5.2* 5.8* 4.7 4.5  CL 89* 90* 94* 89*  CO2 '27 23 28 23  ' GLUCOSE 108* 93 115* 105*  BUN 60* 69* 31* 71*  CREATININE 15.37* 16.62* 9.91* 13.26*  CALCIUM 8.9 9.0 8.7* 9.4  PHOS 7.5*  --  5.5*  --    Liver Function Tests: Recent Labs  Lab 05/16/19 1522 05/17/19 0353 05/18/19 0354 05/22/19 2016  AST 21 26  --  34  ALT 18 19  --  39  ALKPHOS 132* 124  --  192*  BILITOT 0.8 0.9  --  0.7  PROT 6.0* 6.8  --  7.6  ALBUMIN 2.7* 2.6* 2.2* 2.6*   No results for input(s): LIPASE, AMYLASE in the last 168 hours. No results for input(s): AMMONIA in the last 168 hours. CBC: Recent Labs  Lab 05/16/19 1522 05/17/19 0353 05/18/19 0354 05/18/19 0851 05/22/19 2016  WBC 9.7 9.5 8.7  --  12.3*  HGB 8.1* 8.9* 7.0* 7.7* 12.0*  HCT 25.3*  23.9* 28.2* 22.2* 24.4* 38.6*  MCV 86.6 87.0 87.7  --  88.7  PLT 389 362 336  --  384   Cardiac Enzymes: No results for input(s): CKTOTAL, CKMB, CKMBINDEX, TROPONINI in the last 168 hours. CBG: Recent Labs  Lab 05/17/19 0849 05/17/19 1300 05/17/19 1841 05/17/19 2142 05/18/19 0619  GLUCAP 114* 133* 105* 227* 109*   Iron Studies: No results for input(s): IRON, TIBC, TRANSFERRIN, FERRITIN in the last 72 hours. Studies/Results: Dg Foot 2 Views Right  Result Date: 05/22/2019 CLINICAL DATA:  Diabetic ulcer of right foot EXAM: RIGHT FOOT - 2  VIEW COMPARISON:  05/14/2019 FINDINGS: Prior amputation of the right 2nd toe at the PIP joint. No acute bony abnormality. Specifically, no fracture, subluxation, or dislocation. No bone destruction to suggest osteomyelitis. IMPRESSION: Prior right toe amputation.  No acute bony abnormality. Electronically Signed   By: Rolm Baptise M.D.   On: 05/22/2019 23:20    Dialysis Orders:  MWF at [facility] 4hr, 450/800, EDW 98kg, 2K/2.25Ca, UFP #3, AVF, heparin 2000 bolus - Hectoral 15mg IV q HD - Mircera 584m IV q 2 weeks (last 7/15)  Assessment/Plan: 1. R foot cellulitis: S/p second toe partial amputation on 7/25. On IV antibiotics per primary. Planned for surgical debridement and possible amputation by podiatry tomorrow.  2.  ESRD:  Continue MWF schedule, next dialysis today. K+ 4.7.  3.  Hypertension/volume: BP elevated on presentation, now controlled. No SOB and minimal edema on exam. Planned for HD today with UF as tolerated.  4.  Anemia: Hgb 12.0. Increased compared to baseline, did receive 1 unit PRBC prior to last admission. No ESA indicated at this time.  5.  Metabolic bone disease: Calcium 9.4, corrected 10.5. Will hold hectorol, follow calcium. Phos 5.5, switched to renvela powder during last admission- continue same.  6.  Nutrition:  Renal diet with fluid restriction.  7. T2DM: Insulin per primary  SaAnice PaganiniPA-C 05/23/2019, 10:31 AM  [facility] Pager: [phone] seen, examined and agree w A/P as above.  RoKelly SplinterMD 05/23/2019, 1:45 PM

## 2019-05-23 NOTE — Plan of Care (Signed)
  Problem: Activity: Goal: Risk for activity intolerance will decrease Outcome: Progressing   Problem: Coping: Goal: Level of anxiety will decrease Outcome: Progressing   Problem: Pain Managment: Goal: General experience of comfort will improve Outcome: Progressing   Problem: Safety: Goal: Ability to remain free from injury will improve Outcome: Progressing   Problem: Clinical Measurements: Goal: Ability to avoid or minimize complications of infection will improve Outcome: Progressing

## 2019-05-24 ENCOUNTER — Inpatient Hospital Stay (HOSPITAL_COMMUNITY): Payer: Medicare Other

## 2019-05-24 ENCOUNTER — Telehealth: Payer: Self-pay | Admitting: Podiatry

## 2019-05-24 ENCOUNTER — Encounter (HOSPITAL_COMMUNITY): Payer: Self-pay | Admitting: Podiatry

## 2019-05-24 ENCOUNTER — Inpatient Hospital Stay (HOSPITAL_COMMUNITY): Payer: Medicare Other | Admitting: Certified Registered Nurse Anesthetist

## 2019-05-24 ENCOUNTER — Encounter (HOSPITAL_COMMUNITY)
Admission: AD | Disposition: A | Discharge status: Home Health | Payer: Self-pay | Source: Ambulatory Visit | Attending: Internal Medicine

## 2019-05-24 DIAGNOSIS — L089 Local infection of the skin and subcutaneous tissue, unspecified: Secondary | ICD-10-CM

## 2019-05-24 DIAGNOSIS — E11628 Type 2 diabetes mellitus with other skin complications: Secondary | ICD-10-CM

## 2019-05-24 DIAGNOSIS — L97514 Non-pressure chronic ulcer of other part of right foot with necrosis of bone: Secondary | ICD-10-CM

## 2019-05-24 DIAGNOSIS — L02611 Cutaneous abscess of right foot: Secondary | ICD-10-CM

## 2019-05-24 LAB — COMPREHENSIVE METABOLIC PANEL
ALT: 92 U/L — ABNORMAL HIGH (ref 0–44)
AST: 75 U/L — ABNORMAL HIGH (ref 15–41)
Albumin: 2.1 g/dL — ABNORMAL LOW (ref 3.5–5.0)
Alkaline Phosphatase: 330 U/L — ABNORMAL HIGH (ref 38–126)
Anion gap: 18 — ABNORMAL HIGH (ref 5–15)
BUN: 42 mg/dL — ABNORMAL HIGH (ref 6–20)
CO2: 25 mmol/L (ref 22–32)
Calcium: 8.9 mg/dL (ref 8.9–10.3)
Chloride: 92 mmol/L — ABNORMAL LOW (ref 98–111)
Creatinine, Ser: 9.5 mg/dL — ABNORMAL HIGH (ref 0.61–1.24)
GFR calc Af Amer: 7 mL/min — ABNORMAL LOW (ref >60)
GFR calc non Af Amer: 6 mL/min — ABNORMAL LOW (ref >60)
Glucose, Bld: 131 mg/dL — ABNORMAL HIGH (ref 70–99)
Potassium: 4.3 mmol/L (ref 3.5–5.1)
Sodium: 135 mmol/L (ref 135–145)
Total Bilirubin: 0.7 mg/dL (ref 0.3–1.2)
Total Protein: 6.4 g/dL — ABNORMAL LOW (ref 6.5–8.1)

## 2019-05-24 LAB — CBC WITH DIFFERENTIAL/PLATELET
Abs Immature Granulocytes: 0.25 10*3/uL — ABNORMAL HIGH (ref 0.00–0.07)
Basophils Absolute: 0 10*3/uL (ref 0.0–0.1)
Basophils Relative: 0 %
Eosinophils Absolute: 0.2 10*3/uL (ref 0.0–0.5)
Eosinophils Relative: 1 %
HCT: 25 % — ABNORMAL LOW (ref 39.0–52.0)
Hemoglobin: 7.9 g/dL — ABNORMAL LOW (ref 13.0–17.0)
Immature Granulocytes: 1 %
Lymphocytes Relative: 5 %
Lymphs Abs: 1 10*3/uL (ref 0.7–4.0)
MCH: 28 pg (ref 26.0–34.0)
MCHC: 31.6 g/dL (ref 30.0–36.0)
MCV: 88.7 fL (ref 80.0–100.0)
Monocytes Absolute: 1 10*3/uL (ref 0.1–1.0)
Monocytes Relative: 5 %
Neutro Abs: 16.4 10*3/uL — ABNORMAL HIGH (ref 1.7–7.7)
Neutrophils Relative %: 88 %
Platelets: 340 10*3/uL (ref 150–400)
RBC: 2.82 MIL/uL — ABNORMAL LOW (ref 4.22–5.81)
RDW: 16.6 % — ABNORMAL HIGH (ref 11.5–15.5)
WBC: 18.8 10*3/uL — ABNORMAL HIGH (ref 4.0–10.5)
nRBC: 0 % (ref 0.0–0.2)

## 2019-05-24 LAB — IRON AND TIBC: Iron: 16 ug/dL — ABNORMAL LOW (ref 45–182)

## 2019-05-24 LAB — FERRITIN: Ferritin: 2749 ng/mL — ABNORMAL HIGH (ref 24–336)

## 2019-05-24 LAB — FOLATE: Folate: 22.1 ng/mL (ref >5.9)

## 2019-05-24 LAB — RETICULOCYTES
Immature Retic Fract: 13.5 % (ref 2.3–15.9)
RBC.: 2.82 MIL/uL — ABNORMAL LOW (ref 4.22–5.81)
Retic Count, Absolute: 36.7 10*3/uL (ref 19.0–186.0)
Retic Ct Pct: 1.3 % (ref 0.4–3.1)

## 2019-05-24 LAB — VANCOMYCIN, RANDOM: Vancomycin Rm: 31

## 2019-05-24 LAB — MAGNESIUM: Magnesium: 2.3 mg/dL (ref 1.7–2.4)

## 2019-05-24 LAB — VITAMIN B12: Vitamin B-12: 1936 pg/mL — ABNORMAL HIGH (ref 180–914)

## 2019-05-24 LAB — GLUCOSE, CAPILLARY: Glucose-Capillary: 154 mg/dL — ABNORMAL HIGH (ref 70–99)

## 2019-05-24 LAB — PHOSPHORUS: Phosphorus: 6.6 mg/dL — ABNORMAL HIGH (ref 2.5–4.6)

## 2019-05-24 SURGERY — DEBRIDEMENT, WOUND
Anesthesia: Monitor Anesthesia Care | Laterality: Right

## 2019-05-24 MED ORDER — FENTANYL CITRATE (PF) 100 MCG/2ML IJ SOLN
INTRAMUSCULAR | Status: AC
  Administered 2019-05-24: 50 ug via INTRAVENOUS
  Filled 2019-05-24: qty 2

## 2019-05-24 MED ORDER — STERILE WATER FOR IRRIGATION IR SOLN
Status: DC | PRN
  Administered 2019-05-23: 200 mL

## 2019-05-24 MED ORDER — DIPHENHYDRAMINE HCL 50 MG/ML IJ SOLN
INTRAMUSCULAR | Status: DC | PRN
  Administered 2019-05-24: 25 mg via INTRAVENOUS

## 2019-05-24 MED ORDER — ONDANSETRON HCL 4 MG/2ML IJ SOLN: 4.0000 mg | Freq: Once | INTRAMUSCULAR | Status: DC | PRN

## 2019-05-24 MED ORDER — DIPHENHYDRAMINE HCL 50 MG/ML IJ SOLN
INTRAMUSCULAR | Status: AC
  Filled 2019-05-24: qty 1

## 2019-05-24 MED ORDER — SUFENTANIL CITRATE 50 MCG/ML IV SOLN
INTRAVENOUS | Status: DC | PRN
  Administered 2019-05-24 (×3): 10 ug via INTRAVENOUS

## 2019-05-24 MED ORDER — MIDAZOLAM HCL 2 MG/2ML IJ SOLN
INTRAMUSCULAR | Status: DC | PRN
  Administered 2019-05-24: 2 mg via INTRAVENOUS

## 2019-05-24 MED ORDER — ONDANSETRON HCL 4 MG/2ML IJ SOLN
INTRAMUSCULAR | Status: DC | PRN
  Administered 2019-05-24: 4 mg via INTRAVENOUS

## 2019-05-24 MED ORDER — VANCOMYCIN HCL 1000 MG IV SOLR
INTRAVENOUS | Status: AC
  Filled 2019-05-24: qty 1000

## 2019-05-24 MED ORDER — GLYCOPYRROLATE PF 0.2 MG/ML IJ SOSY
PREFILLED_SYRINGE | INTRAMUSCULAR | Status: AC
  Filled 2019-05-24: qty 1

## 2019-05-24 MED ORDER — VANCOMYCIN HCL IN DEXTROSE 1-5 GM/200ML-% IV SOLN
1000.0000 mg | INTRAVENOUS | Status: DC
  Administered 2019-05-25 (×2): 1000 mg via INTRAVENOUS
  Filled 2019-05-24 (×2): qty 200

## 2019-05-24 MED ORDER — BUPIVACAINE HCL (PF) 0.5 % IJ SOLN
INTRAMUSCULAR | Status: DC | PRN
  Administered 2019-05-24: 30 mL

## 2019-05-24 MED ORDER — BUPIVACAINE HCL (PF) 0.5 % IJ SOLN
INTRAMUSCULAR | Status: AC
  Filled 2019-05-24: qty 30

## 2019-05-24 MED ORDER — FENTANYL CITRATE (PF) 100 MCG/2ML IJ SOLN
25.0000 ug | INTRAMUSCULAR | Status: DC | PRN
  Administered 2019-05-24 (×2): 50 ug via INTRAVENOUS

## 2019-05-24 MED ORDER — SODIUM CHLORIDE (PF) 0.9 % IJ SOLN
INTRAMUSCULAR | Status: AC
  Filled 2019-05-24: qty 10

## 2019-05-24 MED ORDER — TRAMADOL HCL 50 MG PO TABS
50.0000 mg | ORAL_TABLET | Freq: Once | ORAL | Status: AC
  Administered 2019-05-24: 50 mg via ORAL
  Filled 2019-05-24: qty 1

## 2019-05-24 MED ORDER — ONDANSETRON HCL 4 MG/2ML IJ SOLN
INTRAMUSCULAR | Status: AC
  Filled 2019-05-24: qty 2

## 2019-05-24 MED ORDER — HYDROCODONE-ACETAMINOPHEN 10-325 MG PO TABS
1.0000 | ORAL_TABLET | Freq: Four times a day (QID) | ORAL | Status: DC | PRN
  Administered 2019-05-24 – 2019-05-31 (×18): 1 via ORAL
  Filled 2019-05-24 (×18): qty 1

## 2019-05-24 MED ORDER — VANCOMYCIN HCL 1000 MG IV SOLR
INTRAVENOUS | Status: DC | PRN
  Administered 2019-05-24: 1000 mg via TOPICAL

## 2019-05-24 MED ORDER — SODIUM CHLORIDE 0.9 % IR SOLN
Status: DC | PRN
  Administered 2019-05-23 – 2019-05-24 (×2): 3000 mL

## 2019-05-24 NOTE — Progress Notes (Signed)
Pharmacy Antibiotic Note  ANTWANN PREZIOSI is a 53 y.o. male admitted on 05/22/2019 with cellulitis.   Pharmacy has been consulted for Vancomycin dosing along with Rocephin + Flagyl per MD.  S/p second right toe amputation overnight. Gram stain from operative culture shows rare GNR.  The patient was recently admitted and received Vancomycin with only 2 outpatient HD sessions prior to readmission. Concerned for residual Vancomycin on board, so lower dose of  Vancomycin 750 mg IV was given after HD on 8/3 and random vanc level was done this am.    Random Vanc level is 31 mcg/ml.  Plan HD on 8/5.   Estimate Vanc level will drop to ~27 mcg/ml by 8/5 am, appropriate pre-HD level for re-dosing.  Plan:  Vancomycin 1gm IV after HD on MWF  Ceftriaxone 1 gm IV q24hrs  Flagyl 500 mg IV q8hrs  Follow culture date, HD schedule, antibiotic plans.  Temp (24hrs), Avg:99.3 F (37.4 C), Min:98.5 F (36.9 C), Max:100.5 F (38.1 C)  Recent Labs  Lab 05/18/19 0354 05/22/19 2016 05/22/19 2046 05/23/19 1139 05/24/19 0722  WBC 8.7 12.3*  --  16.4* 18.8*  CREATININE 9.91* 13.26*  --  14.52* 9.50*  LATICACIDVEN  --   --  1.0  --   --   VANCORANDOM  --   --   --   --  31    Estimated Creatinine Clearance: 10.6 mL/min (A) (by C-G formula based on SCr of 9.5 mg/dL (H)).    Allergies  Allergen Reactions  . Amoxicillin Itching    Did it involve swelling of the face/tongue/throat, SOB, or low BP? Unknown Did it involve sudden or severe rash/hives, skin peeling, or any reaction on the inside of your mouth or nose? Unknown Did you need to seek medical attention at a hospital or doctor's office? Unknown When did it last happen?20 years ago If all above answers are "NO", may proceed with cephalosporin use.   Marland Kitchen Hydromorphone Itching    Patient states he may take with benadryl. Makes feet itch.  Marland Kitchen Percocet [Oxycodone-Acetaminophen] Itching and Other (See Comments)    Legs only. Itching and burning feeling.    Keflex/Clinda PTA Vancomycin 7/25 >> 7/27 (last admit); restart 8/3>> Ceftriaxone 8/2 >> Clindamycin 8/2 >> 8/3 Metronidazole 8/3>>  7/17 WCx >> E.coli (pan-S) + Strep agalactiae 7/25 WCx (R-toe) >> E.coli, H influ (B-lactamase neg), B vulgatus (B-lactamase positive)   8/4 right foot wound (surgical culture): rare GNR  8/4 blood x 2:   8/2 COVID: negative  Thank you for allowing pharmacy to be a part of this patient's care.  Arty Baumgartner, [place] Pager; [phone] or phone: [phone] 05/24/2019 1:33 PM   **Pharmacist phone directory can now be found on [email or web address] ([email or web address]).  Listed under [place].

## 2019-05-24 NOTE — Plan of Care (Signed)
  Problem: Safety: Goal: Ability to remain free from injury will improve Outcome: Progressing   Problem: Skin Integrity: Goal: Risk for impaired skin integrity will decrease Outcome: Progressing   Problem: Skin Integrity: Goal: Skin integrity will improve Outcome: Progressing

## 2019-05-24 NOTE — Telephone Encounter (Signed)
Called wife and gave her post-op update.

## 2019-05-24 NOTE — Anesthesia Postprocedure Evaluation (Signed)
Anesthesia Post Note  Patient: [name]  Procedure(s) Performed: INCISION AND DRAINAGE OF RIGHT FOOT DEEP SPACE ABSCESS (Right Foot) AMPUTATION OF SECOND TOE METATARSAL PHALANGEAL JOINT (Right Toe)     Patient location during evaluation: PACU Anesthesia Type: MAC Level of consciousness: awake and alert Pain management: pain level controlled Vital Signs Assessment: post-procedure vital signs reviewed and stable Respiratory status: spontaneous breathing, nonlabored ventilation and respiratory function stable Cardiovascular status: stable, blood pressure returned to baseline and tachycardic Anesthetic complications: no    Last Vitals:  Vitals:   05/24/19 0217 05/24/19 0419  BP: 138/81 123/68  Pulse: (!) 102 94  Resp: 19 19  Temp: 37.5 C 37.1 C  SpO2: 95% 100%    Last Pain:  Vitals:   05/24/19 0419  TempSrc: Oral  PainSc:                  Audry Pili

## 2019-05-24 NOTE — Progress Notes (Signed)
PROGRESS NOTE    [name]  SKA:[id number] DOB: 05-26-1966 DOA: 05/22/2019 PCP: Hayden Rasmussen, MD   Brief Narrative:  HPI per Dr. Shela Leff on 05/22/2019 [name] is a 53 y.o. male with medical history significant of diet-controlled type 2 diabetes, hypertension, ESRD on HD, obesity, iron deficiency anemia presenting to the hospital as a direct admit by Dr. March Rummage from podiatry.  Status post recent right foot second toe partial amputation for osteomyelitis on 7/25.  He was discharged with a 2-week course of Keflex.  ABIs revealed noncompressible right lower extremity arteries and noncompressible left lower extremity arteries.  Left toe brachial index abnormal.  Patient also underwent one-vessel posterior tibial artery runoff of bilateral foot.  Patient was seen by Dr. March Rummage today and found to have temperature 101.6 F.  Surgical site with delayed healing but without frank purulence.  He had erythema and swelling of the right lower extremity.  Given signs of infection he was sent to the hospital for IV antibiotics and possible surgical debridement.  Patient states he has been having low-grade fevers since he left the hospital.  Last night his temperature went up to 101 F range.  He started noticing erythema and swelling extending up from his foot to his ankle.  He is having some discomfort in his foot from wearing an Haematologist.  States he has not been getting Keflex at dialysis as planned.  States 2 days ago Dr. March Rummage switched him to clindamycin and he has been taking this medication.  Denies any shortness of breath, cough, nausea, vomiting, or abdominal pain.  **Interim History  Patient's foot is improved per the patient but he is having significant amount of gas due to his lactose intolerance.  Patient was dialyzed today and likely will undergo surgical intervention.  Will defer further imaging studies to podiatry and may consider an MRI of the foot if patient does not improve  significantly.  Podiatry evaluated last evening and Dr. March Rummage felt that the patient had an abscess so he underwent emergent I&D last night with improved swelling and pain in the leg today.  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 March Rummage the day before yesterday and noted to have erythema and swelling of his right foot.  Sent here as a direct admission for IV antibiotics and possible surgical debridement.  Dr. March Rummage evaluated last night and felt the patient had an abscess and did not improve with antibiotics so he took the patient for emergent I&D  -At podiatry office today, noted to have temperature 101.6 F, blood pressure 150/86, and pulse 99. -IV Vancomycin, Ceftriaxone, and clindamycin based on patient's allergy profile started on Admission and will continue for now based on sensitivities -Keep n.p.o. IV fluid hydration now stopped; Was receiving 125 mL/hr x 12 hours -C/w Acetaminophen 650 mg po q6hprn Mild Pain/Fever >/= 101 -CBC with differential showed WBC of 12.3 and worsened to 16.4 -  Lactic acid was 1.0 -ESR was 122, CRP was 41.3 -WBC worsened and went from 16.4 is now 18.8 -Stat right foot x-ray showed "Prior amputation of the right 2nd toe at the PIP joint. No acute bony abnormality. Specifically, no fracture, subluxation, or dislocation. No bone destruction to suggest osteomyelitis." -Podiatry following, appreciate recommendations -Wound care  consult appreciated  -Patient had blood cultures done on 05/14/2019 but none repeated when he presented to the hospital yesterday. -We will obtain blood cultures now; patient's tissue culture of the right second toe grew out rare E. coli, rare Haemophilus parainfluenza, beta-lactamase negative, few Bacteriodes Vulgatus, and Beta-Lactamase positive with E. coli that was pansensitive -Blood cultures were not done until this morning and repeat aerobic/anaerobic Gram stain showed rare gram-negative rods with culture still pending -Continue IV antibiotics and de-escalate accordingly -Dr. March Rummage recommends repeating the debridement later this week and he has been made n.p.o. at midnight again  Type 2 Diabetes -Currently diet controlled.  Blood Sugar on CMP this AM was 131 -A1c was 8.0 on 7/26. -C/w Sensitive Novolog SSI q4h for now; on a Renal/Carb Modified Diet with 1200 mL Fluid Restriction  -Continue monitor CBGs carefully  ESRD on HD MWF Elevated Anion Gap of 18 Hyperphosphatemia Hypermagnesemia  -No signs of volume overload at this time.  Has a right upper arm fistula -Patient's BUN/Cr went from 31/9.91 -> 71/13.26 -> 81/14.52 -> 42/9.50 -She was dialyzed yesterday and will continue dialysis maintenance schedule per nephrology -Continue sevelamer carbonate 2.4 g p.o. 3 times daily with meals -Nephrology consulted for Maintenance of Dialysis   Abnormal LFT's, Elevated Alk Phos -AST went from 34 -> 82 -> 75 -ALT went from 39 -> 77 -> 92 Alk phos went from 248 is now 330 -Check RUQ U/S and Acute Hepatitis Panel in AM -Continue to Monitor and Trend Hepatic Fxn Panel -Repeat CMP in AM   Hypertension -Not on any home medications. -Continue to Monitor blood pressure -C/w Hydralazine 5 mg q4hprn for SBP>160 -BP was 145/81 this AM   Normocytic Anemia/Anemia of Chronic Kidney Disease/ESRD -Patient's Hb/Hct on admission was 12.0/38.6 and ? If this was a spurious result and ? Dilutional  Drop -Repeat Hgb/Hct was 7.9/25.0 -Check Anemia Panel in the AM and incomplete as iron level 16, ferritin levels 2749, folate levels to 21.1, and vitamin B12 levels 1936 -Continue to Monitor for S/Sx of Bleeding; Currently no Overt bleeding noted -Repeat CBC in AM   Obesity -Estimated body mass index is 31.06 kg/m as calculated from the following:   Height as of 05/13/19: _0  (1.778 m).   Weight as of this encounter: 98.2 kg. -Weight Loss and Dietary Counseling given  GERD/Reflux/Indigestion -C/w Calcium Carbonate 500 mg po q6hprn Indigestion and Heartburn -Given Simethicone 80 mg po x1  Hyponatremia/Hypochloremia -Patient's Na+ was 133 on admission and Chloride was 89 -Na+ today is 135 and repeat Chloride was 92 -Likely to be corrected in Dialysis -Repeat CMP in AM   DVT prophylaxis: SCDs Code Status: FULL CODE Family Communication: Discussed with wife at bedside Disposition Plan: Pending further workup and evaluation by Podiatry   Consultants:   Podiatry   Procedures: Done by Dr. March Rummage on 8 4        1) Incision and drainage of deep space abscess of the right foot             2) Right second toe amputation at the metatarsophalangeal joint  Antimicrobials:  Anti-infectives (From admission, onward)   Start  Dose/Rate Route Frequency Ordered Stop   05/25/19 1200  vancomycin (VANCOCIN) IVPB 1000 mg/200 mL premix     1,000 mg 200 mL/hr over 60 Minutes Intravenous Every M-W-F (Hemodialysis) 05/24/19 1325     05/24/19 0013  vancomycin (VANCOCIN) powder  Status:  Discontinued       As needed 05/24/19 0119 05/24/19 0124   05/23/19 1800  metroNIDAZOLE (FLAGYL) IVPB 500 mg     500 mg 100 mL/hr over 60 Minutes Intravenous Every 8 hours 05/23/19 1433     05/23/19 1425  vancomycin variable dose per unstable renal function (pharmacist dosing)  Status:  Discontinued      Does not apply See admin instructions 05/23/19 1425 05/24/19 1325   05/23/19 1415  vancomycin (VANCOCIN)  IVPB 750 mg/150 ml premix     750 mg 150 mL/hr over 60 Minutes Intravenous Every Mon (Hemodialysis) 05/23/19 1414 05/23/19 1723   05/22/19 2200  cefTRIAXone (ROCEPHIN) 2 g in sodium chloride 0.9 % 100 mL IVPB  Status:  Discontinued     2 g 200 mL/hr over 30 Minutes Intravenous Every 24 hours 05/22/19 2105 05/22/19 2110   05/22/19 2200  clindamycin (CLEOCIN) IVPB 600 mg  Status:  Discontinued     600 mg 100 mL/hr over 30 Minutes Intravenous Every 8 hours 05/22/19 2105 05/22/19 2110   05/22/19 2200  vancomycin (VANCOCIN) 2,000 mg in sodium chloride 0.9 % 500 mL IVPB  Status:  Discontinued     2,000 mg 250 mL/hr over 120 Minutes Intravenous  Once 05/22/19 2105 05/22/19 2109   05/22/19 2200  vancomycin (VANCOCIN) 2,000 mg in sodium chloride 0.9 % 500 mL IVPB     2,000 mg 250 mL/hr over 120 Minutes Intravenous  Once 05/22/19 2109 05/23/19 0236   05/22/19 2200  clindamycin (CLEOCIN) IVPB 600 mg  Status:  Discontinued     600 mg 100 mL/hr over 30 Minutes Intravenous Every 8 hours 05/22/19 2110 05/23/19 1433   05/22/19 2200  cefTRIAXone (ROCEPHIN) 2 g in sodium chloride 0.9 % 100 mL IVPB     2 g 200 mL/hr over 30 Minutes Intravenous Every 24 hours 05/22/19 2110       Subjective: Seen and examined at bedside states that he had a rough night and underwent surgical intervention early this morning.  Wanted to rest.  Was complaining of pain 7 out of 10 in severity.  No nausea or vomiting.  No other concerns or complaints at this time and states that Benadryl has helped his itching.  Objective: Vitals:   05/24/19 0419 05/24/19 0822 05/24/19 1422 05/24/19 2012  BP: 123/68 (!) 145/81 124/73 124/74  Pulse: 94 96 98 93  Resp: 19   19  Temp: 98.7 F (37.1 C) 98.5 F (36.9 C) 98.9 F (37.2 C) 98.5 F (36.9 C)  TempSrc: Oral Oral Oral Oral  SpO2: 100% 100% 96% 97%  Weight:        Intake/Output Summary (Last 24 hours) at 05/24/2019 2124 Last data filed at 05/24/2019 1721 Gross per 24 hour  Intake  1455 ml  Output 5 ml  Net 1450 ml   Filed Weights   05/23/19 1248 05/23/19 1707  Weight: 100.4 kg 98.2 kg    Examination: Physical Exam:  Constitutional: Well-nourished, well-developed obese African-American male who is appearing somewhat uncomfortable secondary to pain. Eyes: The conjunctive are normal.  Sclera anicteric ENMT: External ears nose appear normal.  Grossly normal hearing Neck: Appears supple no JVD Respiratory: Diminished auscultation bilaterally no patient wheezing,  rales or rhonchi.  Patient not tachypneic wheezing accessory muscle breathe Cardiovascular: Regular rate and rhythm.  Has 1+ lower extremity edema noted right before where his foot is wrapped Abdomen: Soft, nontender, distended second body habitus.  Vessels present GU: Deferred Musculoskeletal: As a right arm AV fistula with a palpable thrill and auscultated bruit Skin: Skin is warm and dry.  Right second toe is wrapped and could not evaluate the swelling and warmth  Neurologic: Cranial nerves II through XII gross intact no appreciable focal deficits. Psychiatric: Normal judgment insight.  Appears a little sleepy.  Not as anxious.  He is awake and oriented x3  Data Reviewed: I have personally reviewed following labs and imaging studies  CBC: Recent Labs  Lab 05/18/19 0354 05/18/19 0851 05/22/19 2016 05/23/19 1139 05/24/19 0722  WBC 8.7  --  12.3* 16.4* 18.8*  NEUTROABS  --   --   --  14.3* 16.4*  HGB 7.0* 7.7* 12.0* 7.5* 7.9*  HCT 22.2* 24.4* 38.6* 23.3* 25.0*  MCV 87.7  --  88.7 87.6 88.7  PLT 336  --  384 386 657   Basic Metabolic Panel: Recent Labs  Lab 05/18/19 0354 05/22/19 2016 05/23/19 1139 05/24/19 0722  NA 135 133* 134* 135  K 4.7 4.5 4.7 4.3  CL 94* 89* 94* 92*  CO2 _0 GLUCOSE 115* 105* 144* 131*  BUN 31* 71* 81* 42*  CREATININE 9.91* 13.26* 14.52* 9.50*  CALCIUM 8.7* 9.4 8.9 8.9  MG 2.6*  --  2.6* 2.3  PHOS 5.5*  --  7.9* 6.6*   GFR: Estimated Creatinine  Clearance: 10.6 mL/min (A) (by C-G formula based on SCr of 9.5 mg/dL (H)). Liver Function Tests: Recent Labs  Lab 05/18/19 0354 05/22/19 2016 05/23/19 1139 05/24/19 0722  AST  --  34 82* 75*  ALT  --  39 77* 92*  ALKPHOS  --  192* 248* 330*  BILITOT  --  0.7 1.1 0.7  PROT  --  7.6 6.6 6.4*  ALBUMIN 2.2* 2.6* 2.2* 2.1*   No results for input(s): LIPASE, AMYLASE in the last 168 hours. No results for input(s): AMMONIA in the last 168 hours. Coagulation Profile: No results for input(s): INR, PROTIME in the last 168 hours. Cardiac Enzymes: No results for input(s): CKTOTAL, CKMB, CKMBINDEX, TROPONINI in the last 168 hours. BNP (last 3 results) No results for input(s): PROBNP in the last 8760 hours. HbA1C: No results for input(s): HGBA1C in the last 72 hours. CBG: Recent Labs  Lab 05/17/19 2142 05/18/19 0619 05/24/19 0117  GLUCAP 227* 109* 154*   Lipid Profile: No results for input(s): CHOL, HDL, LDLCALC, TRIG, CHOLHDL, LDLDIRECT in the last 72 hours. Thyroid Function Tests: No results for input(s): TSH, T4TOTAL, FREET4, T3FREE, THYROIDAB in the last 72 hours. Anemia Panel: Recent Labs    05/24/19 0722  VITAMINB12 1,936*  FOLATE 22.1  FERRITIN 2,749*  TIBC NOT CALCULATED  IRON 16*  RETICCTPCT 1.3   Sepsis Labs: Recent Labs  Lab 05/22/19 2046  LATICACIDVEN 1.0    Recent Results (from the past 240 hour(s))  SARS Coronavirus 2 [facility] order, Performed in [place] hospital lab)     Status: None   Collection Time: 05/22/19  8:48 PM  Result Value Ref Range Status   SARS Coronavirus 2 NEGATIVE NEGATIVE Final    Comment: (NOTE) If result is NEGATIVE SARS-CoV-2 target nucleic acids are NOT DETECTED. The SARS-CoV-2 RNA is generally detectable in upper and lower  respiratory  specimens during the acute phase of infection. The lowest  concentration of SARS-CoV-2 viral copies this assay can detect is 250  copies / mL. A negative result does not preclude SARS-CoV-2  infection  and should not be used as the sole basis for treatment or other  patient management decisions.  A negative result may occur with  improper specimen collection / handling, submission of specimen other  than nasopharyngeal swab, presence of viral mutation(s) within the  areas targeted by this assay, and inadequate number of viral copies  (<250 copies / mL). A negative result must be combined with clinical  observations, patient history, and epidemiological information. If result is POSITIVE SARS-CoV-2 target nucleic acids are DETECTED. The SARS-CoV-2 RNA is generally detectable in upper and lower  respiratory specimens dur ing the acute phase of infection.  Positive  results are indicative of active infection with SARS-CoV-2.  Clinical  correlation with patient history and other diagnostic information is  necessary to determine patient infection status.  Positive results do  not rule out bacterial infection or co-infection with other viruses. If result is PRESUMPTIVE POSTIVE SARS-CoV-2 nucleic acids MAY BE PRESENT.   A presumptive positive result was obtained on the submitted specimen  and confirmed on repeat testing.  While 2019 novel coronavirus  (SARS-CoV-2) nucleic acids may be present in the submitted sample  additional confirmatory testing may be necessary for epidemiological  and / or clinical management purposes  to differentiate between  SARS-CoV-2 and other Sarbecovirus currently known to infect humans.  If clinically indicated additional testing with an alternate test  methodology [id number]) is advised. The SARS-CoV-2 RNA is generally  detectable in upper and lower respiratory sp ecimens during the acute  phase of infection. The expected result is Negative. Fact Sheet for Patients:  StrictlyIdeas.no Fact Sheet for Healthcare Providers: BankingDealers.co.za This test is not yet approved or cleared by the [place]  FDA and has been authorized for detection and/or diagnosis of SARS-CoV-2 by FDA under an Emergency Use Authorization (EUA).  This EUA will remain in effect (meaning this test can be used) for the duration of the COVID-19 declaration under Section 564(b)(1) of the Act, 21 U.S.C. section 360bbb-3(b)(1), unless the authorization is terminated or revoked sooner. Performed at [facility], [place] [address] [place], [place] [address]   Aerobic/Anaerobic Culture (surgical/deep wound)     Status: None (Preliminary result)   Collection Time: 05/24/19 12:26 AM   Specimen: Wound; Abscess  Result Value Ref Range Status   Specimen Description WOUND RIGHT FOOT  Final   Special Requests ID A  Final   Gram Stain   Final    NO WBC SEEN RARE GRAM NEGATIVE RODS Performed at [facility], [place] [address], [place], [place] [address]    Culture PENDING  Incomplete   Report Status PENDING  Incomplete    Radiology Studies: Dg Foot 2 Views Right  Result Date: 05/24/2019 CLINICAL DATA:  Cellulitis.  Abscess.  Second toe amputation. EXAM: RIGHT FOOT - 2 VIEW COMPARISON:  05/22/2019.  05/14/2019. FINDINGS: Right second digit amputation. Distal right second metatarsal head intact. Overlying gauze present. Deformity of the distal phalanx of the right great toe unchanged. Diffuse osteopenia and degenerative change. No radiopaque foreign body. IMPRESSION: 1. Right second digit amputation. Distal right second metatarsal head is intact. 2. Deformity of the distal phalanx of the right great toe again noted. Fracture and/or osteomyelitis of the distal phalanx of the right great could present in this  fashion. 3.  No radiopaque foreign body. Electronically Signed   By: Marcello Moores  Register   On: 05/24/2019 07:52   Dg Foot 2 Views Right  Result Date: 05/22/2019 CLINICAL DATA:  Diabetic ulcer of right foot EXAM: RIGHT FOOT - 2 VIEW COMPARISON:  05/14/2019 FINDINGS: Prior amputation of the right 2nd toe at the PIP joint.  No acute bony abnormality. Specifically, no fracture, subluxation, or dislocation. No bone destruction to suggest osteomyelitis. IMPRESSION: Prior right toe amputation.  No acute bony abnormality. Electronically Signed   By: Rolm Baptise M.D.   On: 05/22/2019 23:20   Scheduled Meds: . Chlorhexidine Gluconate Cloth  6 each Topical Q0600  . insulin aspart  0-9 Units Subcutaneous Q4H  . sevelamer carbonate  2.4 g Oral TID WC   Continuous Infusions: . cefTRIAXone (ROCEPHIN)  IV 2 g (05/23/19 2212)  . metronidazole 500 mg (05/24/19 1830)  . [START ON 05/25/2019] vancomycin      LOS: 2 days   Kerney Elbe, DO [facility] PAGER is on [place]  If 7PM-7AM, please contact night-coverage [email or web address] Password [facility] 05/24/2019, 9:24 PM

## 2019-05-24 NOTE — Transfer of Care (Signed)
Immediate Anesthesia Transfer of Care Note  Patient: [name]  Procedure(s) Performed: IRRIGATION AND DEBRIDEMENT EXTREMITY (Right Foot)  Patient Location: PACU  Anesthesia Type:MAC  Level of Consciousness: awake, alert , oriented, drowsy and patient cooperative  Airway & Oxygen Therapy: Patient Spontanous Breathing  Post-op Assessment: Report given to RN, Post -op Vital signs reviewed and stable and Patient moving all extremities X 4  Post vital signs: Reviewed and stable  Last Vitals:  Vitals Value Taken Time  BP    Temp    Pulse 117 05/24/19 0118  Resp 16 05/24/19 0118  SpO2 96 % 05/24/19 0118  Vitals shown include unvalidated device data.  Last Pain:  Vitals:   05/23/19 2017  TempSrc: Oral  PainSc:          Complications: No apparent anesthesia complications

## 2019-05-24 NOTE — Anesthesia Procedure Notes (Signed)
Procedure Name: MAC Date/Time: 05/24/2019 12:05 AM Performed by: Claris Che, CRNA Pre-anesthesia Checklist: Patient identified, Emergency Drugs available, Suction available, Patient being monitored and Timeout performed Oxygen Delivery Method: Simple face mask

## 2019-05-24 NOTE — Progress Notes (Addendum)
[name] Progress Note   Subjective:   Patient seen in room. Had urgent I&D or R foot abscess and R second toe amputation last night.  Patient is still coping with recent events and is upset with himself for "letting it get this bad." Emotional support provided. Otherwise feeling well and denies SOB, dyspnea, CP, palpitations, abdominal pain, N/V/D, dizziness, peripheral edema.   Objective Vitals:   05/24/19 0201 05/24/19 0217 05/24/19 0419 05/24/19 0822  BP: (!) 146/83 138/81 123/68 (!) 145/81  Pulse: (!) 102 (!) 102 94 96  Resp: (!) 21 19 19    Temp: 99.1 F (37.3 C) 99.5 F (37.5 C) 98.7 F (37.1 C) 98.5 F (36.9 C)  TempSrc:  Oral Oral Oral  SpO2: 95% 95% 100% 100%  Weight:       Physical Exam General: Well developed, well nourished male in NAD Heart: RRR, no murmurs, rubs or gallops appreciated Lungs: CTA bilaterally without wheezing, rhonchi or rales Abdomen: Soft, non-tender, non-distended. +BS Extremities: R foot wrapped. No peripheral edema L leg Dialysis Access:  RUE AVF + thrill  Additional Objective Labs: Basic Metabolic Panel: Recent Labs  Lab 05/18/19 0354 05/22/19 2016 05/23/19 1139 05/24/19 0722  NA 135 133* 134* 135  K 4.7 4.5 4.7 4.3  CL 94* 89* 94* 92*  CO2 28 23 23 25   GLUCOSE 115* 105* 144* 131*  BUN 31* 71* 81* 42*  CREATININE 9.91* 13.26* 14.52* 9.50*  CALCIUM 8.7* 9.4 8.9 8.9  PHOS 5.5*  --  7.9* 6.6*   Liver Function Tests: Recent Labs  Lab 05/22/19 2016 05/23/19 1139 05/24/19 0722  AST 34 82* 75*  ALT 39 77* 92*  ALKPHOS 192* 248* 330*  BILITOT 0.7 1.1 0.7  PROT 7.6 6.6 6.4*  ALBUMIN 2.6* 2.2* 2.1*   CBC: Recent Labs  Lab 05/18/19 0354  05/22/19 2016 05/23/19 1139 05/24/19 0722  WBC 8.7  --  12.3* 16.4* 18.8*  NEUTROABS  --   --   --  14.3* 16.4*  HGB 7.0*   < > 12.0* 7.5* 7.9*  HCT 22.2*   < > 38.6* 23.3* 25.0*  MCV 87.7  --  88.7 87.6 88.7  PLT 336  --  384 386 340   < > = values in this interval  not displayed.   Blood Culture    Component Value Date/Time   SDES WOUND RIGHT FOOT 05/24/2019 0026   SPECREQUEST ID A 05/24/2019 0026   CULT PENDING 05/24/2019 0026   REPTSTATUS PENDING 05/24/2019 0026    CBG: Recent Labs  Lab 05/17/19 1300 05/17/19 1841 05/17/19 2142 05/18/19 0619 05/24/19 0117  GLUCAP 133* 105* 227* 109* 154*   Iron Studies:  Recent Labs    05/24/19 0722  IRON 16*  TIBC NOT CALCULATED  FERRITIN 2,749*    Studies/Results: Dg Foot 2 Views Right  Result Date: 05/24/2019 CLINICAL DATA:  Cellulitis.  Abscess.  Second toe amputation. EXAM: RIGHT FOOT - 2 VIEW COMPARISON:  05/22/2019.  05/14/2019. FINDINGS: Right second digit amputation. Distal right second metatarsal head intact. Overlying gauze present. Deformity of the distal phalanx of the right great toe unchanged. Diffuse osteopenia and degenerative change. No radiopaque foreign body. IMPRESSION: 1. Right second digit amputation. Distal right second metatarsal head is intact. 2. Deformity of the distal phalanx of the right great toe again noted. Fracture and/or osteomyelitis of the distal phalanx of the right great could present in this fashion. 3.  No radiopaque foreign body. Electronically Signed   By:  Rotan   On: 05/24/2019 07:52   Dg Foot 2 Views Right  Result Date: 05/22/2019 CLINICAL DATA:  Diabetic ulcer of right foot EXAM: RIGHT FOOT - 2 VIEW COMPARISON:  05/14/2019 FINDINGS: Prior amputation of the right 2nd toe at the PIP joint. No acute bony abnormality. Specifically, no fracture, subluxation, or dislocation. No bone destruction to suggest osteomyelitis. IMPRESSION: Prior right toe amputation.  No acute bony abnormality. Electronically Signed   By: Rolm Baptise M.D.   On: 05/22/2019 23:20   Medications: . cefTRIAXone (ROCEPHIN)  IV 2 g (05/23/19 2212)  . metronidazole 500 mg (05/24/19 0915)   . Chlorhexidine Gluconate Cloth  6 each Topical Q0600  . insulin aspart  0-9 Units  Subcutaneous Q4H  . sevelamer carbonate  2.4 g Oral TID WC  . vancomycin variable dose per unstable renal function (pharmacist dosing)   Does not apply See admin instructions    Dialysis Orders: MWF at [facility] 4hr, 450/800, EDW 98kg, 2K/2.25Ca, UFP #3, AVF, heparin 2000 bolus - Hectoral 74mcg IV q HD - Mircera 68mcg IV q 2 weeks (last 7/15)   Assessment/Plan: 1. R foot cellulitis: S/p second toe partial amputation on 7/25, urgent I&D and second toe amputation on 05/23/2019. On IV antibiotics per primary. Per podiatry.  2.  ESRD:  Continue MWF schedule, next dialysis 8/5. K+ 4.3.  3.  Hypertension/volume: BP elevated on presentation, now controlled/slightly elevated. 0.2kg above his EDW. No SOB and minimal edema on exam. Planned for HD tomorrow with UF as tolerated.  4.  Anemia: Hgb 12.0 on admission- Increased compared to baseline, did receive 1 unit PRBC prior to last admission. Hemoglobin now 7.9 post-op. Will start aransep 87mcg IV q Wednesday. No iron due to infection/IV antibiotics. Transfuse PRN.  5.  Metabolic bone disease: Calcium 8.9, corrected 10.4. Will hold hectorol, follow calcium. Phos 5.5, switched to renvela powder during last admission- continue same. Phos 7.9 on admission, now improved to 6.6.  6.  Nutrition:  Renal diet with fluid restriction.  7. T2DM: Insulin per primary   Anice Paganini, PA-C 05/24/2019, 10:03 AM  [name] Pager: [phone]  Pt seen, examined and agree w A/P as above.  Kelly Splinter  MD 05/24/2019, 12:22 PM

## 2019-05-24 NOTE — Op Note (Signed)
Patient Name: [name] DOB: [birth year]  MRN: [id number]   Date of Service: 05/24/2019  Surgeon: Dr. Hardie Pulley, DPM Assistants: None Pre-operative Diagnosis:  Cellulitis and abscess of the right foot Post-operative Diagnosis:  Same, plus complex deep space infection Procedures:  1) Incision and drainage of deep space abscess of the right foot  2) Right second toe amputation at the metatarsophalangeal joint Pathology/Specimens: ID Type Source Tests Collected by Time Destination  1 : RIGHT SECOND TOE Amputation Toe, Right SURGICAL PATHOLOGY Evelina Bucy, DPM 05/24/2019 0032   A : AEROBIC AND ANAEROBIC CULTURES OF RIGHT FOOT Abscess Wound AEROBIC/ANAEROBIC CULTURE (SURGICAL/DEEP WOUND) Evelina Bucy, DPM 05/24/2019 0026    Anesthesia: MAC/local with 30 ccs marcaine 0.5% plain Hemostasis: * No tourniquets in log * Estimated Blood Loss: 5 mL Materials: * No implants in log * Medications: 1g Vancomycin powder Complications: none  Indications for Procedure:  This is a 53 y.o. male who previously underwent second amputation.  He was given p.o. antibiotics at discharge for this infection.  At his last postop visit he had fevers and the antibiotics were slightly broadened.  He had continued fevers and then he was admitted for observation.  On admission the foot did not have any evidence of abscess.  He was on antibiotics and on his hospital day 1 stay his white blood cell count was markedly elevated and he had developed a large abscess formation to the plantar aspect the foot.  I discussed the patient he would benefit from emergent incision and drainage due to the rapid progression and failure despite IV antibiotics.  All risk benefits alternatives surgery discussed the patient.   Procedure in Detail: Patient was identified in pre-operative holding area. Formal consent was signed and the right lower extremity was marked. Patient was brought back to the operating room. Anesthesia was  induced. The extremity was prepped and draped in the usual sterile fashion. Timeout was taken to confirm patient name, laterality, and procedure prior to incision.   Attention was then directed to the second toe.  An incision was made from the dorsal aspect of the second toe about the metatarsophalangeal joint to the distal aspect the toe.  Dissection was carried down to the metatarsophalangeal joint.  The toe was disarticulated at the metatarsophalangeal joint and passed for pathology.  The remaining second metatarsal head appeared healthy and viable.  An additional incision was made from the plantar aspect of the distal aspect of the toe to the fluctuant area of the sulcus proximately to the most proximal area of the fluctuance.  The area was then bluntly dissected with curved scissors.  Significant purulent drainage was noted at this area.  The wound was thoroughly explored to evacuate all purulence.  Manual expression was used to express all purulence.  The purulence was collected for culture.  The abscess pocket was then explored for communicating components. This abscess pocket communicated to the medial first MPJ area but did not appear to violate the joint capsule.  This was thoroughly explored and evacuated. The abscess pocket additionally communicated laterally about the superficial plane of the level third and fourth MPJs but additionally did not appear to violate any joint capsules.  These were similarly thoroughly explored and evacuated. The wound was then thoroughly irrigated with 6 L of normal saline via pulse lavage.  1 g vancomycin powder was then applied topically to the wound. The wound edges were loosely brought together with 2-0 nylon.  A large area of gapping  was left open plantarly for drainage and for packing. The wound was thoroughly packed with quarter inch iodoform plain packing material. The foot was then dressed with Xeroform 4 x 4 Kerlix and Ace bandage. Patient tolerated the  procedure well.   Disposition: Following a period of post-operative monitoring, patient will be transferred back to the floor.  He will benefit from return to the operating room for repeat debridement.  He may benefit from serial debridements prior to closure depending upon the improvement of the wound and any continued purulence.  At this time there does not appear to be any residual osteomyelitis.  He did have a severe soft tissue infection and thus continue IV antibiotics are warranted.  Based upon failure of previous p.o. antibiotics I think he would likely still need IV antibiotics with dialysis even at discharge.  We will continue to follow plan for repeat debridement later this week.

## 2019-05-25 ENCOUNTER — Inpatient Hospital Stay (HOSPITAL_COMMUNITY): Payer: Medicare Other

## 2019-05-25 ENCOUNTER — Telehealth: Payer: Self-pay | Admitting: Podiatry

## 2019-05-25 ENCOUNTER — Inpatient Hospital Stay (HOSPITAL_COMMUNITY): Payer: Medicare Other | Admitting: Certified Registered Nurse Anesthetist

## 2019-05-25 ENCOUNTER — Encounter (HOSPITAL_COMMUNITY)
Admission: AD | Disposition: A | Discharge status: Home Health | Payer: Self-pay | Source: Ambulatory Visit | Attending: Internal Medicine

## 2019-05-25 ENCOUNTER — Encounter (HOSPITAL_COMMUNITY): Payer: Self-pay | Admitting: Certified Registered Nurse Anesthetist

## 2019-05-25 DIAGNOSIS — L03115 Cellulitis of right lower limb: Secondary | ICD-10-CM

## 2019-05-25 DIAGNOSIS — E08621 Diabetes mellitus due to underlying condition with foot ulcer: Secondary | ICD-10-CM

## 2019-05-25 HISTORY — PX: IRRIGATION AND DEBRIDEMENT FOOT: SHX6602

## 2019-05-25 LAB — HEPATITIS PANEL, ACUTE
HCV Ab: <0.1 s/co ratio (ref 0.0–0.9)
Hep A IgM: NEGATIVE
Hep B C IgM: NEGATIVE
Hepatitis B Surface Ag: NEGATIVE

## 2019-05-25 LAB — CBC
HCT: 26.8 % — ABNORMAL LOW (ref 39.0–52.0)
Hemoglobin: 8.4 g/dL — ABNORMAL LOW (ref 13.0–17.0)
MCH: 27.7 pg (ref 26.0–34.0)
MCHC: 31.3 g/dL (ref 30.0–36.0)
MCV: 88.4 fL (ref 80.0–100.0)
Platelets: 334 10*3/uL (ref 150–400)
RBC: 3.03 MIL/uL — ABNORMAL LOW (ref 4.22–5.81)
RDW: 16.9 % — ABNORMAL HIGH (ref 11.5–15.5)
WBC: 11.3 10*3/uL — ABNORMAL HIGH (ref 4.0–10.5)
nRBC: 0 % (ref 0.0–0.2)

## 2019-05-25 LAB — RENAL FUNCTION PANEL
Albumin: 2.1 g/dL — ABNORMAL LOW (ref 3.5–5.0)
Anion gap: 17 — ABNORMAL HIGH (ref 5–15)
BUN: 67 mg/dL — ABNORMAL HIGH (ref 6–20)
CO2: 23 mmol/L (ref 22–32)
Calcium: 8.9 mg/dL (ref 8.9–10.3)
Chloride: 93 mmol/L — ABNORMAL LOW (ref 98–111)
Creatinine, Ser: 12.43 mg/dL — ABNORMAL HIGH (ref 0.61–1.24)
GFR calc Af Amer: 5 mL/min — ABNORMAL LOW (ref >60)
GFR calc non Af Amer: 4 mL/min — ABNORMAL LOW (ref >60)
Glucose, Bld: 132 mg/dL — ABNORMAL HIGH (ref 70–99)
Phosphorus: 7.7 mg/dL — ABNORMAL HIGH (ref 2.5–4.6)
Potassium: 4.2 mmol/L (ref 3.5–5.1)
Sodium: 133 mmol/L — ABNORMAL LOW (ref 135–145)

## 2019-05-25 LAB — GLUCOSE, CAPILLARY: Glucose-Capillary: 91 mg/dL (ref 70–99)

## 2019-05-25 LAB — MAGNESIUM: Magnesium: 2.5 mg/dL — ABNORMAL HIGH (ref 1.7–2.4)

## 2019-05-25 SURGERY — IRRIGATION AND DEBRIDEMENT FOOT
Anesthesia: Monitor Anesthesia Care | Laterality: Right

## 2019-05-25 MED ORDER — PROPOFOL 10 MG/ML IV BOLUS
INTRAVENOUS | Status: AC
  Filled 2019-05-25: qty 20

## 2019-05-25 MED ORDER — FENTANYL CITRATE (PF) 250 MCG/5ML IJ SOLN
INTRAMUSCULAR | Status: AC
  Filled 2019-05-25: qty 5

## 2019-05-25 MED ORDER — ONDANSETRON HCL 4 MG/2ML IJ SOLN
INTRAMUSCULAR | Status: AC
  Filled 2019-05-25: qty 2

## 2019-05-25 MED ORDER — PROPOFOL 10 MG/ML IV BOLUS
INTRAVENOUS | Status: DC | PRN
  Administered 2019-05-25: 30 mg via INTRAVENOUS

## 2019-05-25 MED ORDER — PROPOFOL 500 MG/50ML IV EMUL
INTRAVENOUS | Status: DC | PRN
  Administered 2019-05-25: 75 ug/kg/min via INTRAVENOUS

## 2019-05-25 MED ORDER — ONDANSETRON HCL 4 MG/2ML IJ SOLN
INTRAMUSCULAR | Status: DC | PRN
  Administered 2019-05-25: 4 mg via INTRAVENOUS

## 2019-05-25 MED ORDER — VANCOMYCIN HCL IN DEXTROSE 1-5 GM/200ML-% IV SOLN
INTRAVENOUS | Status: AC
  Filled 2019-05-25: qty 200

## 2019-05-25 MED ORDER — SODIUM CHLORIDE 0.9 % IR SOLN
Status: DC | PRN
  Administered 2019-05-25 (×2): 3000 mL

## 2019-05-25 MED ORDER — PROPOFOL 1000 MG/100ML IV EMUL
INTRAVENOUS | Status: AC
  Filled 2019-05-25: qty 100

## 2019-05-25 MED ORDER — LIDOCAINE 2% (20 MG/ML) 5 ML SYRINGE
INTRAMUSCULAR | Status: DC | PRN
  Administered 2019-05-25: 60 mg via INTRAVENOUS

## 2019-05-25 MED ORDER — VANCOMYCIN HCL 1000 MG IV SOLR
INTRAVENOUS | Status: AC
  Filled 2019-05-25: qty 1000

## 2019-05-25 MED ORDER — BACID PO TABS
2.0000 | ORAL_TABLET | Freq: Three times a day (TID) | ORAL | Status: DC
  Administered 2019-05-26 – 2019-05-29 (×4): 2 via ORAL
  Filled 2019-05-25 (×16): qty 2

## 2019-05-25 MED ORDER — BUPIVACAINE HCL (PF) 0.5 % IJ SOLN
INTRAMUSCULAR | Status: AC
  Filled 2019-05-25: qty 30

## 2019-05-25 MED ORDER — FENTANYL CITRATE (PF) 100 MCG/2ML IJ SOLN: 25.0000 ug | INTRAMUSCULAR | Status: DC | PRN

## 2019-05-25 MED ORDER — BUPIVACAINE HCL (PF) 0.5 % IJ SOLN
INTRAMUSCULAR | Status: DC | PRN
  Administered 2019-05-25: 10 mL

## 2019-05-25 MED ORDER — VANCOMYCIN HCL 1000 MG IV SOLR
INTRAVENOUS | Status: DC | PRN
  Administered 2019-05-25: 1000 mg

## 2019-05-25 MED ORDER — SODIUM CHLORIDE 0.9 % IV SOLN
INTRAVENOUS | Status: DC | PRN
  Administered 2019-05-25: 09:00:00 via INTRAVENOUS

## 2019-05-25 MED ORDER — LIDOCAINE 2% (20 MG/ML) 5 ML SYRINGE
INTRAMUSCULAR | Status: AC
  Filled 2019-05-25: qty 5

## 2019-05-25 MED ORDER — ONDANSETRON HCL 4 MG/2ML IJ SOLN: 4.0000 mg | Freq: Once | INTRAMUSCULAR | Status: DC | PRN

## 2019-05-25 MED ORDER — DIPHENHYDRAMINE HCL 25 MG PO CAPS
ORAL_CAPSULE | ORAL | Status: AC
  Filled 2019-05-25: qty 1

## 2019-05-25 MED ORDER — MIDAZOLAM HCL 2 MG/2ML IJ SOLN
INTRAMUSCULAR | Status: AC
  Filled 2019-05-25: qty 2

## 2019-05-25 MED ORDER — HYDROCODONE-ACETAMINOPHEN 5-325 MG PO TABS
2.0000 | ORAL_TABLET | Freq: Once | ORAL | Status: AC
  Administered 2019-05-25: 2 via ORAL

## 2019-05-25 MED ORDER — MIDAZOLAM HCL 5 MG/5ML IJ SOLN
INTRAMUSCULAR | Status: DC | PRN
  Administered 2019-05-25: 2 mg via INTRAVENOUS

## 2019-05-25 MED ORDER — HYDROCODONE-ACETAMINOPHEN 5-325 MG PO TABS
ORAL_TABLET | ORAL | Status: AC
  Filled 2019-05-25: qty 2

## 2019-05-25 SURGICAL SUPPLY — 44 items
BNDG ELASTIC 4X5.8 VLCR STR LF (GAUZE/BANDAGES/DRESSINGS) ×2 IMPLANT
BNDG ELASTIC 6X5.8 VLCR STR LF (GAUZE/BANDAGES/DRESSINGS) ×2 IMPLANT
BNDG ESMARK 4X9 LF (GAUZE/BANDAGES/DRESSINGS) IMPLANT
BNDG GAUZE ELAST 4 BULKY (GAUZE/BANDAGES/DRESSINGS) IMPLANT
CHLORAPREP W/TINT 26 (MISCELLANEOUS) ×2 IMPLANT
COVER SURGICAL LIGHT HANDLE (MISCELLANEOUS) ×2 IMPLANT
COVER WAND RF STERILE (DRAPES) ×2 IMPLANT
CUFF TOURN SGL QUICK 18X4 (TOURNIQUET CUFF) IMPLANT
CUFF TOURN SGL QUICK 34 (TOURNIQUET CUFF)
CUFF TRNQT CYL 34X4.125X (TOURNIQUET CUFF) IMPLANT
DRAPE U-SHAPE 47X51 STRL (DRAPES) ×2 IMPLANT
ELECT CAUTERY BLADE 6.4 (BLADE) ×2 IMPLANT
ELECT REM PT RETURN 9FT ADLT (ELECTROSURGICAL) ×2
ELECTRODE REM PT RTRN 9FT ADLT (ELECTROSURGICAL) ×1 IMPLANT
GAUZE IODOFORM PACK 1/2 7832 (GAUZE/BANDAGES/DRESSINGS) ×4 IMPLANT
GAUZE SPONGE 4X4 12PLY STRL (GAUZE/BANDAGES/DRESSINGS) ×2 IMPLANT
GLOVE BIO SURGEON STRL SZ 6.5 (GLOVE) ×2 IMPLANT
GLOVE BIO SURGEON STRL SZ7.5 (GLOVE) ×2 IMPLANT
GLOVE BIOGEL PI IND STRL 7.0 (GLOVE) ×1 IMPLANT
GLOVE BIOGEL PI IND STRL 8 (GLOVE) ×1 IMPLANT
GLOVE BIOGEL PI INDICATOR 7.0 (GLOVE) ×1
GLOVE BIOGEL PI INDICATOR 8 (GLOVE) ×1
GOWN STRL REUS W/ TWL LRG LVL3 (GOWN DISPOSABLE) ×1 IMPLANT
GOWN STRL REUS W/ TWL XL LVL3 (GOWN DISPOSABLE) ×1 IMPLANT
GOWN STRL REUS W/TWL LRG LVL3 (GOWN DISPOSABLE) ×1
GOWN STRL REUS W/TWL XL LVL3 (GOWN DISPOSABLE) ×1
KIT BASIN OR (CUSTOM PROCEDURE TRAY) ×2 IMPLANT
KIT TURNOVER KIT B (KITS) ×2 IMPLANT
MANIFOLD NEPTUNE II (INSTRUMENTS) ×2 IMPLANT
NEEDLE BIOPSY JAMSHIDI 8X6 (NEEDLE) IMPLANT
NEEDLE HYPO 25GX1X1/2 BEV (NEEDLE) IMPLANT
NS IRRIG 1000ML POUR BTL (IV SOLUTION) ×2 IMPLANT
PACK ORTHO EXTREMITY (CUSTOM PROCEDURE TRAY) ×2 IMPLANT
PAD ABD 8X10 STRL (GAUZE/BANDAGES/DRESSINGS) ×2 IMPLANT
PAD ARMBOARD 7.5X6 YLW CONV (MISCELLANEOUS) ×4 IMPLANT
PROBE DEBRIDE SONICVAC MISONIX (TIP) IMPLANT
SET CYSTO W/LG BORE CLAMP LF (SET/KITS/TRAYS/PACK) ×2 IMPLANT
SOL PREP POV-IOD 4OZ 10% (MISCELLANEOUS) ×4 IMPLANT
SUCTION FRAZIER TIP 10 FR DISP (SUCTIONS) ×2 IMPLANT
SYR CONTROL 10ML LL (SYRINGE) IMPLANT
TOWEL GREEN STERILE (TOWEL DISPOSABLE) ×2 IMPLANT
TOWEL GREEN STERILE FF (TOWEL DISPOSABLE) ×2 IMPLANT
TUBE CONNECTING 12X1/4 (SUCTIONS) ×2 IMPLANT
YANKAUER SUCT BULB TIP NO VENT (SUCTIONS) ×2 IMPLANT

## 2019-05-25 NOTE — Anesthesia Preprocedure Evaluation (Addendum)
Anesthesia Evaluation  Patient identified by MRN, date of birth, ID band Patient awake    Reviewed: Allergy & Precautions, NPO status , Patient's Chart, lab work & pertinent test results  History of Anesthesia Complications (+) PONV and history of anesthetic complications  Airway Mallampati: II  TM Distance: >3 FB Neck ROM: Full    Dental  (+) Dental Advisory Given   Pulmonary neg pulmonary ROS,    Pulmonary exam normal        Cardiovascular hypertension, Normal cardiovascular exam     Neuro/Psych  Headaches, negative psych ROS   GI/Hepatic negative GI ROS,  Elevated LFTs    Endo/Other  diabetes, Type 2 Obesity   Renal/GU ESRF and DialysisRenal disease     Musculoskeletal negative musculoskeletal ROS (+)   Abdominal   Peds  Hematology  (+) anemia ,  Leukocytosis    Anesthesia Other Findings   Reproductive/Obstetrics                            Anesthesia Physical  Anesthesia Plan  ASA: III  Anesthesia Plan: MAC   Post-op Pain Management:    Induction: Intravenous  PONV Risk Score and Plan: 2 and Treatment may vary due to age or medical condition and Propofol infusion  Airway Management Planned: Natural Airway and Simple Face Mask  Additional Equipment: None  Intra-op Plan:   Post-operative Plan:   Informed Consent: I have reviewed the patients History and Physical, chart, labs and discussed the procedure including the risks, benefits and alternatives for the proposed anesthesia with the patient or authorized representative who has indicated his/her understanding and acceptance.       Plan Discussed with: CRNA, Anesthesiologist and Surgeon  Anesthesia Plan Comments:      Anesthesia Quick Evaluation

## 2019-05-25 NOTE — Progress Notes (Signed)
Patient transported to HD via bed °

## 2019-05-25 NOTE — Op Note (Signed)
Patient Name: [name] DOB: 22-May-1966  MRN: [id number]   Date of Service: 05/22/2019 - 05/25/2019  Surgeon: Dr. Hardie Pulley, DPM Assistants: None Pre-operative Diagnosis:  Cellulitis and abscess of right foot, ulcer right foot right Post-operative Diagnosis:  Same Procedures:  1) Incision and drainage of right foot infection, multiple bursa Pathology/Specimens: ID Type Source Tests Collected by Time Destination  A : cultures right foot wound Wound Wound AEROBIC/ANAEROBIC CULTURE (SURGICAL/DEEP WOUND) Evelina Bucy, DPM 05/25/2019 9563    Anesthesia: MAC local Hemostasis: * No tourniquets in log * Estimated Blood Loss: 10 mLs Materials: * No implants in log * Medications: 1 g vancomycin powder Complications: None  Indications for Procedure:  This is a 53 y.o. male with a right lower extremity wound with cellulitis.  He presents today for repeat debridement after incision and drainage a couple days ago.   Procedure in Detail: Patient was identified in pre-operative holding area. Formal consent was signed and the right lower extremity was marked. Patient was brought back to the operating room. Anesthesia was induced. The extremity was prepped and draped in the usual sterile fashion. Timeout was taken to confirm patient name, laterality, and procedure prior to incision.   Attention was then directed to the right foot.  The retention sutures were incised and the area was inspected.  The proximal aspect of the wound was manually compressed and there was some purulence expressed with this area.  This was collected for culture.  The wound was then copiously irrigated with 3 L normal saline via pulse lavage.  After for thorough pulse lavage the proximal lateral incision was incised and bluntly explored via blunt dissection.  There was some continued purulence which was evacuated.  The surrounding tissues were thoroughly excisionally debrided with a 15 blade and forceps.  There was a tendon  tract that was inspected but no purulence was noted to be in this area.  The wound was sharply excisionally debrided of all nonviable tissue.  The lateral lesser MPJ area was additionally excisionally debrided of nonviable tissue.  The medial lateral wound edges of the previous incision and drainage were sharply incised until bleeding viable tissue was noted.  Following thorough excisional debridement the wound was again copiously irrigated with 3 L of normal saline via pulse lavage.  The tendon tract and the wound bed were then packed with half-inch iodoform packing. The foot was then dressed with ABD 4 x 4 Kerlix and Ace bandage. Patient tolerated the procedure well.   Disposition: Following a period of post-operative monitoring, patient will be transferred back to the floor.  He will benefit from repeat debridement to ensure that the wound bed is clean.  We will plan for the procedure likely Friday he will likely need wound VAC therapy to assist in closure.

## 2019-05-25 NOTE — Progress Notes (Signed)
CBG 91 in PACU.

## 2019-05-25 NOTE — Anesthesia Postprocedure Evaluation (Signed)
Anesthesia Post Note  Patient: [name]  Procedure(s) Performed: INCISION AND DRAINAGE FOOT (Right )     Patient location during evaluation: PACU Anesthesia Type: MAC Level of consciousness: awake and alert Pain management: pain level controlled Vital Signs Assessment: post-procedure vital signs reviewed and stable Respiratory status: spontaneous breathing, nonlabored ventilation and respiratory function stable Cardiovascular status: stable and blood pressure returned to baseline Anesthetic complications: no    Last Vitals:  Vitals:   05/25/19 1015 05/25/19 1026  BP: 128/76 (!) 143/82  Pulse: 94 94  Resp: 20 15  Temp: (!) 36.1 C (!) 36.2 C  SpO2: 96% 97%    Last Pain:  Vitals:   05/25/19 1100  TempSrc:   PainSc: 0-No pain                 Audry Pili

## 2019-05-25 NOTE — Telephone Encounter (Signed)
Called and gave wife post-op update.

## 2019-05-25 NOTE — Progress Notes (Addendum)
[facility] Progress Note   Subjective:   Patient seen in room. Planned for HD today per regular schedule. Patient is concerned because he was told a RUQ [place] was completed this AM but he does not think it was actually completed. His floor nurse is looking into this. He would also like labs to be drawn in HD instead of by phlebotomy when possible. Had I&D of R foot this AM. He denies SOB, dyspnea, CP, palpitations, abdominal pain, N/V/D, edema.   Objective Vitals:   05/24/19 2012 05/25/19 0422 05/25/19 1015 05/25/19 1026  BP: 124/74 132/80 128/76 (!) 143/82  Pulse: 93 93 94 94  Resp: 19  20 15   Temp: 98.5 F (36.9 C) 98.6 F (37 C) (!) 97 F (36.1 C) (!) 97.2 F (36.2 C)  TempSrc: Oral Oral    SpO2: 97% 91% 96% 97%  Weight:       Physical Exam General: Well developed, well nourished male in NAD Heart: RRR, no murmurs, rubs or gallops appreciated Lungs: CTA bilaterally without wheezing, rhonchi or rales Abdomen: Soft, non-tender, non-distended. +BS Extremities: R foot wrapped. No peripheral edema L leg Dialysis Access:  RUE AVF + thrill  Additional Objective Labs: Basic Metabolic Panel: Recent Labs  Lab 05/22/19 2016 05/23/19 1139 05/24/19 0722  NA 133* 134* 135  K 4.5 4.7 4.3  CL 89* 94* 92*  CO2 23 23 25   GLUCOSE 105* 144* 131*  BUN 71* 81* 42*  CREATININE 13.26* 14.52* 9.50*  CALCIUM 9.4 8.9 8.9  PHOS  --  7.9* 6.6*   Liver Function Tests: Recent Labs  Lab 05/22/19 2016 05/23/19 1139 05/24/19 0722  AST 34 82* 75*  ALT 39 77* 92*  ALKPHOS 192* 248* 330*  BILITOT 0.7 1.1 0.7  PROT 7.6 6.6 6.4*  ALBUMIN 2.6* 2.2* 2.1*   CBC: Recent Labs  Lab 05/22/19 2016 05/23/19 1139 05/24/19 0722  WBC 12.3* 16.4* 18.8*  NEUTROABS  --  14.3* 16.4*  HGB 12.0* 7.5* 7.9*  HCT 38.6* 23.3* 25.0*  MCV 88.7 87.6 88.7  PLT 384 386 340   Blood Culture    Component Value Date/Time   SDES WOUND RIGHT FOOT 05/25/2019 0925   SPECREQUEST NONE 05/25/2019  0925   CULT PENDING 05/25/2019 0925   REPTSTATUS PENDING 05/25/2019 0925    CBG: Recent Labs  Lab 05/24/19 0117  GLUCAP 154*   Iron Studies:  Recent Labs    05/24/19 0722  IRON 16*  TIBC NOT CALCULATED  FERRITIN 2,749*   @[email or web address] @ Studies/Results: Dg Foot 2 Views Right  Result Date: 05/24/2019 CLINICAL DATA:  Cellulitis.  Abscess.  Second toe amputation. EXAM: RIGHT FOOT - 2 VIEW COMPARISON:  05/22/2019.  05/14/2019. FINDINGS: Right second digit amputation. Distal right second metatarsal head intact. Overlying gauze present. Deformity of the distal phalanx of the right great toe unchanged. Diffuse osteopenia and degenerative change. No radiopaque foreign body. IMPRESSION: 1. Right second digit amputation. Distal right second metatarsal head is intact. 2. Deformity of the distal phalanx of the right great toe again noted. Fracture and/or osteomyelitis of the distal phalanx of the right great could present in this fashion. 3.  No radiopaque foreign body. Electronically Signed   By: Marcello Moores  Register   On: 05/24/2019 07:52   US Abdomen Limited Ruq  Result Date: 05/25/2019 CLINICAL DATA:  Abnormal LFTs. EXAM: ULTRASOUND ABDOMEN LIMITED RIGHT UPPER QUADRANT COMPARISON:  Ultrasound 10/06/2013. FINDINGS: Gallbladder: Multiple gallstones measuring up to 1.1 cm. Gallbladder wall thickness 2.6 mm. Negative  Murphy sign. Common bile duct: Diameter: 4.1 mm Liver: No focal lesion identified. Within normal limits in parenchymal echogenicity. Portal vein is patent on color Doppler imaging with normal direction of blood flow towards the liver. Other: Trace ascites. IMPRESSION: 1. Multiple gallstones. Gallbladder wall thickness 2.6 mm. Negative Murphy sign. No biliary distention. 2. Trace ascites. Electronically Signed   By: Marcello Moores  Register   On: 05/25/2019 08:44   Medications: . cefTRIAXone (ROCEPHIN)  IV 2 g (05/24/19 2303)  . metronidazole 500 mg (05/25/19 0208)  . vancomycin     . Chlorhexidine  Gluconate Cloth  6 each Topical Q0600  . insulin aspart  0-9 Units Subcutaneous Q4H  . lactobacillus acidophilus  2 tablet Oral TID  . sevelamer carbonate  2.4 g Oral TID WC    Dialysis Orders: MWF at [facility] 4hr, 450/800, EDW 98kg, 2K/2.25Ca, UFP #3, AVF, heparin 2000 bolus - Hectoral 64mcg IV q HD - Mircera 28mcg IV q 2 weeks (last 7/15)  Assessment/Plan: 1. R foot cellulitis:S/p second toe partial amputation on 7/25, urgent I&D and second toe amputation on 05/23/2019 and further debridement today. On IV ceftriaxone and vancomycin. Per podiatry.  2. ESRD:Continue MWF schedule, next dialysis 8/5. K+ 4.3 (no labs done this AM, will draw RFP with HD) 3. Hypertension/volume:BP elevated on presentation, now controlled/slightly elevated. 0.2kg above his EDW by weights yesterday. No SOB and minimal edema on exam. Planned for HD tomorrow with UF as tolerated. 4. Anemia:Hgb 12.0 on admission- Increased compared to baseline, did receive 1 unit PRBC prior to last admission. Hemoglobin now 7.9 yesterday. Will start aransep 37mcg IV q Wednesday. No iron due to infection/IV antibiotics. Transfuse PRN.  5. Metabolic bone disease:Calcium 8.9, corrected 10.4. Will hold hectorol, follow calcium. Phos 5.5, switched to renvela powder during last admission- continue same.Phos 7.9 on admission, now improved to 6.6.  6. Nutrition:Renal diet with fluid restriction.  7. T2DM: Insulin per primary  8. Elevated LFTs: LFTs trending upwards. RUQ [place] was ordered which was reportedly done this AM, but patient has no memory of this and thinks there was a mistake. His floor nurse is looking into this. Per primary.   Anice Paganini, PA-C 05/25/2019, 12:23 PM  [facility] Pager: [phone]  Pt seen, examined and agree w A/P as above.  Kelly Splinter  MD 05/25/2019, 2:27 PM

## 2019-05-25 NOTE — Progress Notes (Signed)
Pt stated to RN and MD this morning he does not remember being taken downstairs for ultrasound this morning. However, results were posted at [phone]. This RN called the supervisor for ultrasound Donavan Foil) to express patient's concern as he is alert and oriented x4. The supervisor stated that according to teletracking he was picked up at 0624 and dropped back off in room at 0638.

## 2019-05-25 NOTE — Progress Notes (Signed)
PROGRESS NOTE    [name]  BDZ:[id number] DOB: 13-Jan-1966 DOA: 05/22/2019 PCP: Hayden Rasmussen, MD   Brief Narrative:  HPI per Dr. Shela Leff on 05/22/2019 [name] is a 53 y.o. male with medical history significant of diet-controlled type 2 diabetes, hypertension, ESRD on HD, obesity, iron deficiency anemia presenting to the hospital as a direct admit by Dr. March Rummage from podiatry.  Status post recent right foot second toe partial amputation for osteomyelitis on 7/25.  He was discharged with a 2-week course of Keflex.  ABIs revealed noncompressible right lower extremity arteries and noncompressible left lower extremity arteries.  Left toe brachial index abnormal.  Patient also underwent one-vessel posterior tibial artery runoff of bilateral foot.  Patient was seen by Dr. March Rummage today and found to have temperature 101.6 F.  Surgical site with delayed healing but without frank purulence.  He had erythema and swelling of the right lower extremity.  Given signs of infection he was sent to the hospital for IV antibiotics and possible surgical debridement.  Patient states he has been having low-grade fevers since he left the hospital.  Last night his temperature went up to 101 F range.  He started noticing erythema and swelling extending up from his foot to his ankle.  He is having some discomfort in his foot from wearing an Haematologist.  States he has not been getting Keflex at dialysis as planned.  States 2 days ago Dr. March Rummage switched him to clindamycin and he has been taking this medication.  Denies any shortness of breath, cough, nausea, vomiting, or abdominal pain.  **Interim History  Patient's foot is improved per the patient but he is having significant amount of gas due to his lactose intolerance.  Patient was dialyzed today and likely will undergo surgical intervention.  Will defer further imaging studies to podiatry and may consider an MRI of the foot if patient does not improve  significantly.  Podiatry evaluated last evening and Dr. March Rummage felt that the patient had an abscess so he underwent emergent I&D last night with improved swelling and pain in the leg today.  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 March Rummage the day before yesterday and noted to have erythema and swelling of his right foot.  Sent here as a direct admission for IV antibiotics and possible surgical debridement.  Dr. March Rummage evaluated last night and felt the patient had an abscess and did not improve with antibiotics so he took the patient for emergent I&D  -At podiatry office today, noted to have temperature 101.6 F, blood pressure 150/86, and pulse 99. -IV Vancomycin, Ceftriaxone, and clindamycin based on patient's allergy profile started on Admission and will continue for now based on sensitivities   -Afebrile, normotensive -ESR was 122, CRP was 41.3,  -WBC worsened and went from 16.4 >>> 18.8 >. -Stat right foot x-ray showed "Prior amputation of the right 2nd toe at the PIP joint. No acute bony abnormality. Specifically, no fracture, subluxation, or dislocation. No  bone destruction to suggest osteomyelitis."  -Wound care consult appreciated  -Patient had blood cultures done on 05/14/2019 but none repeated when he presented to the hospital yesterday. --Repeat blood culture 05/24/2019  >> - patient's tissue culture of the right second toe grew out rare E. coli, rare Haemophilus parainfluenza, beta-lactamase negative, few  Bacteriodes Vulgatus, and Beta-Lactamase positive with E. coli that was pansensitive  -  Type 2 Diabetes -Checking blood sugar QA CHS, SSI -A1c was 8.0 on 7/26. - -Continue monitor CBGs carefully  ESRD  - on HD MWF Elevated Anion Gap of 18 -Nephrology Dr. Tessa Lerner following -Anticipating hemodialysis today  Hyperphosphatemia / Hypermagnesemia  -Monitoring anticipating improvement with hemodialysis  -Continue sevelamer carbonate 2.4 g p.o. 3 times daily with meals    Abnormal LFT's, Elevated Alk Phos -AST went from 34 -> 82 -> 75 -ALT went from 39 -> 77 -> 92 Alk phos went from 248 is now 330 -RUQ U/S and Acute Hepatitis :   IMPRESSION: 1. Multiple gallstones. Gallbladder wall thickness 2.6 mm. Negative Murphy sign. No biliary distention. 2. Trace ascites.  -Continue to Monitor and Trend Hepatic Fxn Panel   Hypertension -Not on any home medications. -Continue to Monitor blood pressure -C/w Hydralazine 5 mg q4hprn for SBP>160 -BP was 145/81 this AM   Normocytic Anemia/Anemia of Chronic Kidney Disease/ESRD -Patient's Hb/Hct on admission was 12.0/38.6 and ? If this was a spurious result and ? Dilutional Drop -Repeat Hgb/Hct was 7.9/25.0 -Check Anemia Panel in the AM and incomplete as iron level 16, ferritin levels 2749, folate levels to 21.1, and vitamin B12 levels 1936 -Continue to Monitor for S/Sx of Bleeding; Currently no Overt bleeding noted -Repeat CBC in AM   Obesity -Estimated body mass index is 31.06 kg/m as calculated from the following:   Height as of 05/13/19: 5' 10" (1.778 m).   Weight as of this encounter: 98.2 kg. -Weight Loss and Dietary Counseling given  GERD/Reflux/Indigestion -C/w Calcium Carbonate 500 mg po q6hprn Indigestion and Heartburn -Given Simethicone 80 mg po x1  Hyponatremia/Hypochloremia -Patient's Na+ was 133 on admission and Chloride was 89 -Na+ today is 135 and repeat Chloride was 92 -Likely to be corrected in  Dialysis -Repeat CMP in AM   DVT prophylaxis: SCDs Code Status: FULL CODE Family Communication: Discussed with wife at bedside Disposition Plan: Pending further workup and evaluation by Podiatry   Consultants:   Podiatry   Procedures:   Done by Dr. March Rummage on 8/  4        1) Incision and drainage of deep space abscess of the right foot             2) Right second toe amputation at the metatarsophalangeal joint  -05/14/2019 S/P second toe partial amputation -05/23/2019 urgent I&D, second toe amputation, debridement -05/25/2019 further debridement, irrigation of her right foot woun   RUQ U/S and Acute Hepatitis :   IMPRESSION: 1. Multiple gallstones. Gallbladder wall thickness 2.6 mm. Negative Murphy sign. No biliary distention. 2. Trace as  Antimicrobials:  Anti-infectives (From admission, onward)   Start     Dose/Rate Route Frequency Ordered Stop   05/25/19 1200  vancomycin (VANCOCIN) IVPB 1000 mg/200 mL premix     1,000 mg 200 mL/hr over 60 Minutes Intravenous Every M-W-F (Hemodialysis) 05/24/19 1325     05/25/19 0829  vancomycin (VANCOCIN) powder  Status:  Discontinued       As needed 05/25/19 0830 05/25/19 1006   05/24/19 0013  vancomycin (VANCOCIN) powder  Status:  Discontinued       As needed 05/24/19 0119 05/24/19 0124   05/23/19 1800  metroNIDAZOLE (FLAGYL) IVPB 500 mg     500 mg 100 mL/hr over 60 Minutes Intravenous Every 8 hours 05/23/19 1433     05/23/19 1425  vancomycin variable dose per unstable renal function (pharmacist dosing)  Status:  Discontinued      Does not apply See admin instructions 05/23/19 1425 05/24/19 1325   05/23/19 1415  vancomycin (VANCOCIN) IVPB 750 mg/150 ml premix     750 mg 150 mL/hr over 60 Minutes Intravenous Every Mon (Hemodialysis) 05/23/19 1414 05/23/19 1723   05/22/19 2200  cefTRIAXone (ROCEPHIN) 2 g in sodium chloride 0.9 % 100 mL IVPB  Status:  Discontinued     2 g 200 mL/hr over 30 Minutes Intravenous Every 24 hours 05/22/19 2105  05/22/19 2110   05/22/19 2200  clindamycin (CLEOCIN) IVPB 600 mg  Status:  Discontinued     600 mg 100 mL/hr over 30 Minutes Intravenous Every 8 hours 05/22/19 2105 05/22/19 2110   05/22/19 2200  vancomycin (VANCOCIN) 2,000 mg in sodium chloride 0.9 % 500 mL IVPB  Status:  Discontinued     2,000 mg 250 mL/hr over 120 Minutes Intravenous  Once 05/22/19 2105 05/22/19 2109   05/22/19 2200  vancomycin (VANCOCIN) 2,000 mg in sodium chloride 0.9 % 500 mL IVPB     2,000 mg 250 mL/hr over 120 Minutes Intravenous  Once 05/22/19 2109 05/23/19 0236   05/22/19 2200  clindamycin (CLEOCIN) IVPB 600 mg  Status:  Discontinued     600 mg 100 mL/hr over 30 Minutes Intravenous Every 8 hours 05/22/19 2110 05/23/19 1433   05/22/19 2200  cefTRIAXone (ROCEPHIN) 2 g in sodium chloride 0.9 % 100 mL IVPB     2 g 200 mL/hr over 30 Minutes Intravenous Every 24 hours 05/22/19 2110       Subjective:  Patient was seen and examined, stable Sleepy,  Postop day #0  Objective: Vitals:   05/24/19 2012 05/25/19 0422 05/25/19 1015 05/25/19 1026  BP: 124/74 132/80 128/76 (!) 143/82  Pulse: 93 93 94 94  Resp: _0 Temp: 98.5 F (36.9 C) 98.6 F (37 C) (!) 97 F (36.1 C) (!) 97.2 F (36.2 C)  TempSrc: Oral Oral    SpO2: 97% 91% 96% 97%  Weight:        Intake/Output Summary (Last 24 hours) at 05/25/2019 1459 Last data filed at 05/25/2019 1003 Gross per 24 hour  Intake 760 ml  Output 5 ml  Net 755 ml   Filed Weights   05/23/19 1248 05/23/19 1707  Weight: 100.4 kg 98.2 kg    Examination: BP (!) 143/82 (BP Location: Right Arm)    Pulse 94    Temp (!) 97.2 F (36.2 C)    Resp 15    Wt 98.2 kg    SpO2 97%    BMI 31.06 kg/m    Physical Exam  Constitution:  Alert, cooperative, no distress,  Psychiatric: Normal and stable mood and affect, cognition intact,   HEENT: Normocephalic, PERRL, otherwise with in Normal limits  Chest:Chest symmetric Cardio vascular:  S1/S2, RRR, No murmure, No Rubs or Gallops   pulmonary: Clear to auscultation bilaterally, respirations unlabored, negative wheezes / crackles Abdomen: Soft, non-tender, non-distended, bowel sounds,no masses, no organomegaly Neuro: CNII-XII intact. , normal motor and sensation, reflexes intact  Musculoskeletal: As a right arm AV fistula with a palpable thrill and auscultated bruit Skin:  Skin is warm and dry.  Right second toe is wrapped and could not evaluate the swelling and warmth  .  Data Reviewed: I have personally reviewed following labs and imaging studies  CBC: Recent Labs  Lab 05/22/19 2016 05/23/19 1139 05/24/19 0722  WBC 12.3* 16.4* 18.8*  NEUTROABS  --  14.3* 16.4*  HGB 12.0* 7.5* 7.9*  HCT 38.6* 23.3* 25.0*  MCV 88.7 87.6 88.7  PLT 384 386 564   Basic Metabolic Panel: Recent Labs  Lab 05/22/19 2016 05/23/19 1139 05/24/19 0722  NA 133* 134* 135  K 4.5 4.7 4.3  CL 89* 94* 92*  CO2 _0 GLUCOSE 105* 144* 131*  BUN 71* 81* 42*  CREATININE 13.26* 14.52* 9.50*  CALCIUM 9.4 8.9 8.9  MG  --  2.6* 2.3  PHOS  --  7.9* 6.6*   GFR: Estimated Creatinine Clearance: 10.6 mL/min (A) (by C-G formula based on SCr of 9.5 mg/dL (H)). Liver Function Tests: Recent Labs  Lab 05/22/19 2016 05/23/19 1139 05/24/19 0722  AST 34 82* 75*  ALT 39 77* 92*  ALKPHOS 192* 248* 330*  BILITOT 0.7 1.1 0.7  PROT 7.6 6.6 6.4*  ALBUMIN 2.6* 2.2* 2.1*  CBG: Recent Labs  Lab 05/24/19 0117  GLUCAP 154*    Recent Labs    05/24/19 0722  VITAMINB12 1,936*  FOLATE 22.1  FERRITIN 2,749*  TIBC NOT CALCULATED  IRON 16*  RETICCTPCT 1.3   Sepsis Labs: Recent Labs  Lab 05/22/19 2046  LATICACIDVEN 1.0    Recent Results (from the past 240 hour(s))  SARS Coronavirus 2 [facility] order, Performed in [place] hospital lab)     Status: None   Collection Time: 05/22/19  8:48 PM  Result Value Ref Range Status   SARS Coronavirus 2 NEGATIVE NEGATIVE Final    Comment: (NOTE) If result is NEGATIVE SARS-CoV-2 target  nucleic acids are NOT DETECTED. The SARS-CoV-2 RNA is generally detectable in upper and lower  respiratory specimens during the acute phase of infection. The lowest  concentration of SARS-CoV-2 viral copies this assay can detect is 250  copies / mL. A negative result does not preclude SARS-CoV-2 infection  and should not be used as the sole basis for treatment or other  patient management decisions.  A negative result may occur with  improper specimen collection / handling, submission of specimen other  than nasopharyngeal swab, presence of viral mutation(s) within the  areas targeted by this assay, and inadequate number of viral copies  (<250 copies / mL). A negative result must be combined with clinical  observations, patient history, and epidemiological information. If result is POSITIVE SARS-CoV-2 target nucleic acids are DETECTED. The SARS-CoV-2 RNA is generally detectable in upper and lower  respiratory specimens dur ing the acute phase of infection.  Positive  results are indicative of active infection with SARS-CoV-2.  Clinical  correlation with patient history and other diagnostic information is  necessary to determine patient infection status.  Positive results do  not rule out bacterial infection or co-infection with other viruses. If result is PRESUMPTIVE POSTIVE SARS-CoV-2 nucleic acids MAY BE PRESENT.   A presumptive positive result was obtained on the submitted specimen  and confirmed on repeat testing.  While 2019 novel coronavirus  (SARS-CoV-2) nucleic acids may be present in the submitted sample  additional confirmatory testing may be necessary for epidemiological  and / or clinical management purposes  to differentiate between  SARS-CoV-2 and other Sarbecovirus currently known to infect humans.  If clinically indicated additional testing with an alternate test  methodology [phone]) is advised. The SARS-CoV-2 RNA is generally  detectable in upper and lower  respiratory sp ecimens during the acute  phase of infection. The expected result is Negative. Fact Sheet for Patients:  StrictlyIdeas.no Fact Sheet for Healthcare Providers: BankingDealers.co.za This test is not yet approved or cleared by the [place] FDA and has been authorized for detection and/or diagnosis of SARS-CoV-2 by FDA under an Emergency Use Authorization (EUA).  This EUA will remain in effect (meaning this test can be used) for the duration of the COVID-19 declaration under Section 564(b)(1) of the Act, 21 U.S.C. section 360bbb-3(b)(1), unless the authorization is terminated or revoked sooner. Performed at [facility], [place] [address], [place], [place] [address]   Aerobic/Anaerobic Culture (surgical/deep wound)     Status: None (Preliminary result)   Collection Time: 05/24/19 12:26 AM   Specimen: Wound; Abscess  Result Value Ref Range Status   Specimen Description WOUND RIGHT FOOT  Final   Special Requests ID A  Final   Gram Stain NO WBC SEEN RARE GRAM NEGATIVE RODS   Final   Culture   Final    FEW ESCHERICHIA COLI SUSCEPTIBILITIES TO FOLLOW Performed at [facility], [place] [address], [place], [place] [address]    Report Status PENDING  Incomplete  Culture, blood (Routine X 2) w Reflex to ID Panel     Status: None (Preliminary result)   Collection Time: 05/24/19  7:25 AM   Specimen: BLOOD LEFT HAND  Result Value Ref Range Status   Specimen Description BLOOD LEFT HAND  Final   Special Requests   Final    BOTTLES DRAWN AEROBIC ONLY Blood Culture results may not be optimal due to an inadequate volume of blood received in culture bottles   Culture   Final    NO GROWTH 1 DAY Performed at [facility], [place] [address] [place], [place] [address]    Report Status PENDING  Incomplete  Culture, blood (Routine X 2) w Reflex to ID Panel     Status: None (Preliminary result)   Collection Time: 05/24/19   7:29 AM   Specimen: BLOOD LEFT HAND  Result Value Ref Range Status   Specimen Description BLOOD LEFT HAND  Final   Special Requests   Final    BOTTLES DRAWN AEROBIC ONLY Blood Culture results may not be optimal due to an inadequate volume of blood received in culture bottles   Culture   Final    NO GROWTH 1 DAY Performed at [facility], [place] [address] [place], [place] [address]    Report Status PENDING  Incomplete  Aerobic/Anaerobic Culture (surgical/deep wound)     Status: None (Preliminary result)   Collection Time: 05/25/19  9:25 AM   Specimen: Wound  Result Value Ref Range Status   Specimen Description WOUND RIGHT FOOT  Final   Special Requests NONE  Final   Gram Stain   Final    NO WBC SEEN NO ORGANISMS SEEN Performed at [facility], [address], [place], [place] [address]    Culture PENDING  Incomplete   Report Status PENDING  Incomplete    Radiology Studies: Dg Foot 2 Views Right  Result Date: 05/24/2019 CLINICAL DATA:  Cellulitis.  Abscess.  Second toe amputation. EXAM: RIGHT FOOT - 2 VIEW COMPARISON:  05/22/2019.  05/14/2019. FINDINGS: Right second digit amputation. Distal right second metatarsal head intact. Overlying  gauze present. Deformity of the distal phalanx of the right great toe unchanged. Diffuse osteopenia and degenerative change. No radiopaque foreign body. IMPRESSION: 1. Right second digit amputation. Distal right second metatarsal head is intact. 2. Deformity of the distal phalanx of the right great toe again noted. Fracture and/or osteomyelitis of the distal phalanx of the right great could present in this fashion. 3.  No radiopaque foreign body. Electronically Signed   By: Marcello Moores  Register   On: 05/24/2019 07:52   US Abdomen Limited Ruq  Result Date: 05/25/2019 CLINICAL DATA:  Abnormal LFTs. EXAM: ULTRASOUND ABDOMEN LIMITED RIGHT UPPER QUADRANT COMPARISON:  Ultrasound 10/06/2013. FINDINGS: Gallbladder: Multiple gallstones measuring up to 1.1  cm. Gallbladder wall thickness 2.6 mm. Negative Murphy sign. Common bile duct: Diameter: 4.1 mm Liver: No focal lesion identified. Within normal limits in parenchymal echogenicity. Portal vein is patent on color Doppler imaging with normal direction of blood flow towards the liver. Other: Trace ascites. IMPRESSION: 1. Multiple gallstones. Gallbladder wall thickness 2.6 mm. Negative Murphy sign. No biliary distention. 2. Trace ascites. Electronically Signed   By: Marcello Moores  Register   On: 05/25/2019 08:44   Scheduled Meds:  Chlorhexidine Gluconate Cloth  6 each Topical Q0600   insulin aspart  0-9 Units Subcutaneous Q4H   lactobacillus acidophilus  2 tablet Oral TID   sevelamer carbonate  2.4 g Oral TID WC   Continuous Infusions:  cefTRIAXone (ROCEPHIN)  IV 2 g (05/24/19 2303)   metronidazole 500 mg (05/25/19 0208)   vancomycin      LOS: 3 days   Deatra Jaylan, MD  [facility] PAGER is on AMION  If 7PM-7AM, please contact night-coverage [email or web address] Password [email or web address] 05/25/2019, 2:59 PM

## 2019-05-25 NOTE — Progress Notes (Signed)
Lab came to do blood work.  Patient became irritated because he said that his left arm is not a good place to get blood. Right arm is restricted with fistula.  He did allow lab to try but they were unsuccessful.  Patient refused any further sticking.  Labs were not done this morning.  Patient would like labs done when he goes to HD after surgery.

## 2019-05-25 NOTE — Transfer of Care (Signed)
Immediate Anesthesia Transfer of Care Note  Patient: [name]  Procedure(s) Performed: INCISION AND DRAINAGE FOOT (Right )  Patient Location: PACU  Anesthesia Type:MAC  Level of Consciousness: awake, alert  and oriented  Airway & Oxygen Therapy: Patient Spontanous Breathing and Patient connected to face mask oxygen  Post-op Assessment: Report given to RN and Post -op Vital signs reviewed and stable  Post vital signs: Reviewed and stable  Last Vitals:  Vitals Value Taken Time  BP 128/76 05/25/19 1012  Temp    Pulse 96 05/25/19 1012  Resp 18 05/25/19 1012  SpO2 96 % 05/25/19 1012  Vitals shown include unvalidated device data.  Last Pain:  Vitals:   05/25/19 0422  TempSrc: Oral  PainSc:       Patients Stated Pain Goal: 3 (20/80/22 3361)  Complications: No apparent anesthesia complications

## 2019-05-25 NOTE — Progress Notes (Signed)
Subjective:  Patient ID: [name], male    DOB: 11/20/1965,  MRN: [id number]  Seen in pre-op. Endorses compliance with NPO status. Understands plan for OR today. Pain continues to improve to the RLE. Objective:   Vitals:   05/24/19 2012 05/25/19 0422  BP: 124/74 132/80  Pulse: 93 93  Resp: 19   Temp: 98.5 F (36.9 C) 98.6 F (37 C)  SpO2: 97% 91%   General AA&O x3. Normal mood and affect.  Vascular Dorsalis pedis pulse palpable.  PT nonpalpable Right foot warm to touch.  Neurologic Epicritic sensation grossly diminished.  Dermatologic R foot dressing intact without strikethrough.  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   No results found for this or any previous visit (from the past 24 hour(s)). Results for orders placed or performed during the hospital encounter of 05/22/19  SARS Coronavirus 2 [facility] order, Performed in [place] hospital lab)     Status: None   Collection Time: 05/22/19  8:48 PM  Result Value Ref Range Status   SARS Coronavirus 2 NEGATIVE NEGATIVE Final    Comment: (NOTE) If result is NEGATIVE SARS-CoV-2 target nucleic acids are NOT DETECTED. The SARS-CoV-2 RNA is generally detectable in upper and lower  respiratory specimens during the acute phase of infection. The lowest  concentration of SARS-CoV-2 viral copies this assay can detect is 250  copies / mL. A negative result does not preclude SARS-CoV-2 infection  and should not be used as the sole basis for treatment or other  patient management decisions.  A negative result may occur with  improper specimen collection / handling, submission of specimen other  than nasopharyngeal swab, presence of viral mutation(s) within the  areas targeted by this assay, and inadequate number of viral copies  (<250 copies / mL). A negative result must be combined with clinical  observations, patient history, and epidemiological information. If result is POSITIVE SARS-CoV-2 target nucleic acids are  DETECTED. The SARS-CoV-2 RNA is generally detectable in upper and lower  respiratory specimens dur ing the acute phase of infection.  Positive  results are indicative of active infection with SARS-CoV-2.  Clinical  correlation with patient history and other diagnostic information is  necessary to determine patient infection status.  Positive results do  not rule out bacterial infection or co-infection with other viruses. If result is PRESUMPTIVE POSTIVE SARS-CoV-2 nucleic acids MAY BE PRESENT.   A presumptive positive result was obtained on the submitted specimen  and confirmed on repeat testing.  While 2019 novel coronavirus  (SARS-CoV-2) nucleic acids may be present in the submitted sample  additional confirmatory testing may be necessary for epidemiological  and / or clinical management purposes  to differentiate between  SARS-CoV-2 and other Sarbecovirus currently known to infect humans.  If clinically indicated additional testing with an alternate test  methodology [phone]) is advised. The SARS-CoV-2 RNA is generally  detectable in upper and lower respiratory sp ecimens during the acute  phase of infection. The expected result is Negative. Fact Sheet for Patients:  StrictlyIdeas.no Fact Sheet for Healthcare Providers: BankingDealers.co.za This test is not yet approved or cleared by the [place] FDA and has been authorized for detection and/or diagnosis of SARS-CoV-2 by FDA under an Emergency Use Authorization (EUA).  This EUA will remain in effect (meaning this test can be used) for the duration of the COVID-19 declaration under Section 564(b)(1) of the Act, 21 U.S.C. section 360bbb-3(b)(1), unless the authorization is terminated or revoked sooner. Performed at [facility], [place] [address], [place], [place] [address]   Aerobic/Anaerobic Culture (surgical/deep wound)     Status: None (Preliminary result)   Collection  Time: 05/24/19 12:26 AM   Specimen: Wound; Abscess  Result Value Ref Range Status   Specimen Description WOUND RIGHT FOOT  Final   Special Requests ID A  Final   Gram Stain   Final    NO WBC SEEN RARE GRAM NEGATIVE RODS Performed at [facility], [place] [address], [place], [place] [address]    Culture PENDING  Incomplete   Report Status PENDING  Incomplete    Assessment & Plan:  Patient was evaluated and treated and all questions answered.  Right Foot Cellulitis, Abscess new deep space infection -No new labs today, per patient they were unable to draw blood this AM. -Cultures pending. GNRs on GS. -To OR today for debridement and irrigation of right foot wounds. Right lower extremity marked. Consent reviewed and signed. -Will continue to follow -Continue empiric IV abx.  Evelina Bucy, DPM  Accessible via secure chat for questions or concerns.

## 2019-05-26 ENCOUNTER — Encounter (HOSPITAL_COMMUNITY): Payer: Self-pay | Admitting: Podiatry

## 2019-05-26 ENCOUNTER — Other Ambulatory Visit: Payer: Medicare Other

## 2019-05-26 LAB — COMPREHENSIVE METABOLIC PANEL
ALT: 51 U/L — ABNORMAL HIGH (ref 0–44)
AST: 18 U/L (ref 15–41)
Albumin: 2 g/dL — ABNORMAL LOW (ref 3.5–5.0)
Alkaline Phosphatase: 283 U/L — ABNORMAL HIGH (ref 38–126)
Anion gap: 17 — ABNORMAL HIGH (ref 5–15)
BUN: 34 mg/dL — ABNORMAL HIGH (ref 6–20)
CO2: 25 mmol/L (ref 22–32)
Calcium: 8.8 mg/dL — ABNORMAL LOW (ref 8.9–10.3)
Chloride: 91 mmol/L — ABNORMAL LOW (ref 98–111)
Creatinine, Ser: 7.87 mg/dL — ABNORMAL HIGH (ref 0.61–1.24)
GFR calc Af Amer: 8 mL/min — ABNORMAL LOW (ref >60)
GFR calc non Af Amer: 7 mL/min — ABNORMAL LOW (ref >60)
Glucose, Bld: 141 mg/dL — ABNORMAL HIGH (ref 70–99)
Potassium: 3.7 mmol/L (ref 3.5–5.1)
Sodium: 133 mmol/L — ABNORMAL LOW (ref 135–145)
Total Bilirubin: 0.8 mg/dL (ref 0.3–1.2)
Total Protein: 6.6 g/dL (ref 6.5–8.1)

## 2019-05-26 LAB — CBC WITH DIFFERENTIAL/PLATELET
Abs Immature Granulocytes: 0.31 10*3/uL — ABNORMAL HIGH (ref 0.00–0.07)
Basophils Absolute: 0 10*3/uL (ref 0.0–0.1)
Basophils Relative: 0 %
Eosinophils Absolute: 0.6 10*3/uL — ABNORMAL HIGH (ref 0.0–0.5)
Eosinophils Relative: 4 %
HCT: 24.6 % — ABNORMAL LOW (ref 39.0–52.0)
Hemoglobin: 7.7 g/dL — ABNORMAL LOW (ref 13.0–17.0)
Immature Granulocytes: 2 %
Lymphocytes Relative: 6 %
Lymphs Abs: 0.9 10*3/uL (ref 0.7–4.0)
MCH: 28.1 pg (ref 26.0–34.0)
MCHC: 31.3 g/dL (ref 30.0–36.0)
MCV: 89.8 fL (ref 80.0–100.0)
Monocytes Absolute: 0.8 10*3/uL (ref 0.1–1.0)
Monocytes Relative: 5 %
Neutro Abs: 12.9 10*3/uL — ABNORMAL HIGH (ref 1.7–7.7)
Neutrophils Relative %: 83 %
Platelets: 361 10*3/uL (ref 150–400)
RBC: 2.74 MIL/uL — ABNORMAL LOW (ref 4.22–5.81)
RDW: 16.9 % — ABNORMAL HIGH (ref 11.5–15.5)
WBC: 15.6 10*3/uL — ABNORMAL HIGH (ref 4.0–10.5)
nRBC: 0 % (ref 0.0–0.2)

## 2019-05-26 LAB — HEPATITIS PANEL, ACUTE
HCV Ab: 0.1 s/co ratio (ref 0.0–0.9)
Hep A IgM: NEGATIVE
Hep B C IgM: NEGATIVE
Hepatitis B Surface Ag: NEGATIVE

## 2019-05-26 LAB — GLUCOSE, CAPILLARY: Glucose-Capillary: 129 mg/dL — ABNORMAL HIGH (ref 70–99)

## 2019-05-26 MED ORDER — DAKINS (1/4 STRENGTH) 0.125 % EX SOLN
Freq: Once | CUTANEOUS | Status: AC
  Administered 2019-05-26: 20:00:00
  Filled 2019-05-26: qty 473

## 2019-05-26 NOTE — Progress Notes (Signed)
Met with patient to offer support and evaluate how he did last week with plan for HD at OP clinic on DOD. Patient states DOD felt rushed, but that he made it to his clinic a little late.  He spent at least 30 minutes talking about his health and what he has learned about caring for himself through his disease. He seemed to appreciate space given by Renal Navigator to share his experiences and feelings. He is adamant about his commitment to healing after this foot surgery and states he knows what he needs to do by resting and allowing others to care for him. It sounds like he has a good support system through his wife. He told Renal Navigator that they have a 55 year old son at home also. He enjoyed being able to go to his OP HD clinic at discharge and would like to do this again if he is to be discharged on a dialysis day again. Renal Navigator will arrange if there is knowledge of discharge in time and explained that this is what the goal is for all HD patients. Patient agrees to have Renal Navigator check back in on Monday if he is still in the hospital and thanked Renal Navigator for coming by to see him.  Alphonzo Cruise, [place] Renal Navigator [phone]

## 2019-05-26 NOTE — Progress Notes (Signed)
PROGRESS NOTE    [name]  EGB:[id number] DOB: [birth year] DOA: 05/22/2019 PCP: Hayden Rasmussen, MD   Brief Narrative:  HPI per Dr. Shela Leff on 05/22/2019 [name] is a 53 y.o. male with medical history significant of diet-controlled type 2 diabetes, hypertension, ESRD on HD, obesity, iron deficiency anemia presenting to the hospital as a direct admit by Dr. March Rummage from podiatry.  Status post recent right foot second toe partial amputation for osteomyelitis on 7/25.  He was discharged with a 2-week course of Keflex.  ABIs revealed noncompressible right lower extremity arteries and noncompressible left lower extremity arteries.  Left toe brachial index abnormal.  Patient also underwent one-vessel posterior tibial artery runoff of bilateral foot.  Patient was seen by Dr. March Rummage today and found to have temperature 101.6 F.  Surgical site with delayed healing but without frank purulence.  He had erythema and swelling of the right lower extremity.  Given signs of infection he was sent to the hospital for IV antibiotics and possible surgical debridement.  Patient states he has been having low-grade fevers since he left the hospital.  Last night his temperature went up to 101 F range.  He started noticing erythema and swelling extending up from his foot to his ankle.  He is having some discomfort in his foot from wearing an Haematologist.  States he has not been getting Keflex at dialysis as planned.  States 2 days ago Dr. March Rummage switched him to clindamycin and he has been taking this medication.  Denies any shortness of breath, cough, nausea, vomiting, or abdominal pain.  **Interim History  Patient's foot is improved per the patient but he is having significant amount of gas due to his lactose intolerance.  Patient was dialyzed today and likely will undergo surgical intervention.  Will defer further imaging studies to podiatry and may consider an MRI of the foot if patient does not improve  significantly.  Podiatry evaluated last evening and Dr. March Rummage felt that the patient had an abscess so he underwent emergent I&D last night with improved swelling and pain in the leg today.  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 March Rummage: s/p successful surgical debridement, further I&D. Intraoperative cultures-pending  -Continue-IV Vancomycin, Ceftriaxone, and clindamycin based on patient's allergy profile   -Afebrile, normotensive -ESR was 122, CRP was 41.3,  -WBC worsened and went from 16.4 >>> 18.8 >> 15.6 today . -Stat right foot x-ray showed "Prior amputation of the right 2nd toe at the PIP joint. No acute bony abnormality. Specifically, no fracture, subluxation, or dislocation. No bone destruction to suggest osteomyelitis."  -Wound care consult appreciated  -Patient had blood cultures done on 05/14/2019 but none repeated when he presented to the hospital yesterday. --Repeat blood culture 05/24/2019  >> - patient's tissue culture of the right second toe grew out rare E. coli, rare Haemophilus parainfluenza, beta-lactamase negative, few Bacteriodes Vulgatus, and Beta-Lactamase positive with  E. coli that was pansensitive  -  Type 2 Diabetes -Checking blood sugar QA CHS, SSI -A1c was 8.0 on 7/26. - -Continue monitor CBGs carefully  ESRD  - on HD MWF Elevated Anion Gap of 18 -Nephrology Dr. Tessa Lerner following -Anticipating  hemodialysis 04/24/2019  Hyperphosphatemia / Hypermagnesemia  -Monitoring anticipating improvement with hemodialysis  -Continue sevelamer carbonate 2.4 g p.o. 3 times daily with meals    Abnormal LFT's, -Steadily improving, avoiding nephrotoxins -AST went from 34 -> 82 -> 75 > 18 -ALT went from 39 -> 77 -> 92 >>51 Alk phos went from 248 >>> 330 >>>283 -Hepatitis panel A/B/C negative  -RUQ U/S and Acute Hepatitis :   IMPRESSION: 1. Multiple gallstones. Gallbladder wall thickness 2.6 mm. Negative Murphy sign. No biliary distention. 2. Trace ascites.  -Continue to Monitor and Trend Hepatic Fxn Panel   Hypertension -Not on any home medications. -Continue to Monitor blood pressure -C/w Hydralazine 5 mg q4hprn for SBP>160 -BP was 145/81 this AM   Normocytic Anemia/Anemia of Chronic Kidney Disease/ESRD -Patient's Hb/Hct on admission was 12.0/38.6 and ? If this was a spurious result and ? Dilutional Drop -Repeat Hgb/Hct was 7.9/25.0 -Check Anemia Panel in the AM and incomplete as iron level 16, ferritin levels 2749, folate levels to 21.1, and vitamin B12 levels 1936 -Continue to Monitor for S/Sx of Bleeding; Currently no Overt bleeding noted -Repeat CBC in AM   Obesity -Estimated body mass index is 30.94 kg/m as calculated from the following:   Height as of 05/13/19: _0  (1.778 m).   Weight as of this encounter: 97.8 kg. -Weight Loss and Dietary Counseling given  GERD/Reflux/Indigestion -C/w Calcium Carbonate 500 mg po q6hprn Indigestion and Heartburn -Given Simethicone 80 mg po x1  Hyponatremia/Hypochloremia -Patient's Na+ was 133 on admission and Chloride was 89 -Na+ today is 135 and repeat Chloride was 92 -Likely to be corrected in Dialysis -Repeat CMP in AM   DVT prophylaxis: SCDs Code Status: FULL CODE Family Communication: Discussed with wife at bedside Disposition Plan: Pending further workup and evaluation by Podiatry   Consultants:   Podiatry   Procedures:   Done by Dr. March Rummage on 8/ 4/20        1) Incision and drainage of deep space abscess of the right foot             2) Right second toe amputation at the metatarsophalangeal joint  -05/14/2019 S/P second toe partial amputation -05/23/2019 urgent I&D, second toe amputation, debridement -05/25/2019 further debridement, irrigation of her right foot woun   RUQ U/S for  Hepatitis :   IMPRESSION: 1. Multiple gallstones. Gallbladder wall thickness 2.6 mm. Negative Murphy sign. No biliary distention. 2. Trace as  Antimicrobials:  Anti-infectives (From admission, onward)   Start     Dose/Rate Route Frequency Ordered Stop   05/25/19 1747  vancomycin (VANCOCIN) 1-5 GM/200ML-% IVPB    Note to Pharmacy: Rodell Perna   : cabinet override      05/25/19 1747 05/25/19 1752   05/25/19 1200  vancomycin (VANCOCIN) IVPB 1000 mg/200 mL premix     1,000 mg 200 mL/hr over 60 Minutes Intravenous Every M-W-F (Hemodialysis) 05/24/19 1325     05/25/19 0829  vancomycin (VANCOCIN) powder  Status:  Discontinued       As needed 05/25/19 0830 05/25/19 1006   05/24/19 0013  vancomycin (VANCOCIN) powder  Status:  Discontinued       As needed 05/24/19 0119 05/24/19 0124   05/23/19 1800  metroNIDAZOLE (FLAGYL)  IVPB 500 mg     500 mg 100 mL/hr over 60 Minutes Intravenous Every 8 hours 05/23/19 1433     05/23/19 1425  vancomycin variable dose per unstable renal function (pharmacist dosing)  Status:  Discontinued      Does not apply See admin instructions 05/23/19 1425 05/24/19 1325   05/23/19 1415  vancomycin (VANCOCIN) IVPB 750 mg/150 ml premix     750 mg 150 mL/hr over 60 Minutes Intravenous Every Mon (Hemodialysis) 05/23/19 1414 05/23/19 1723   05/22/19 2200  cefTRIAXone (ROCEPHIN) 2 g in sodium chloride 0.9 % 100 mL IVPB  Status:  Discontinued     2 g 200 mL/hr over 30 Minutes Intravenous Every 24 hours 05/22/19 2105 05/22/19 2110   05/22/19 2200  clindamycin (CLEOCIN) IVPB 600 mg  Status:   Discontinued     600 mg 100 mL/hr over 30 Minutes Intravenous Every 8 hours 05/22/19 2105 05/22/19 2110   05/22/19 2200  vancomycin (VANCOCIN) 2,000 mg in sodium chloride 0.9 % 500 mL IVPB  Status:  Discontinued     2,000 mg 250 mL/hr over 120 Minutes Intravenous  Once 05/22/19 2105 05/22/19 2109   05/22/19 2200  vancomycin (VANCOCIN) 2,000 mg in sodium chloride 0.9 % 500 mL IVPB     2,000 mg 250 mL/hr over 120 Minutes Intravenous  Once 05/22/19 2109 05/23/19 0236   05/22/19 2200  clindamycin (CLEOCIN) IVPB 600 mg  Status:  Discontinued     600 mg 100 mL/hr over 30 Minutes Intravenous Every 8 hours 05/22/19 2110 05/23/19 1433   05/22/19 2200  cefTRIAXone (ROCEPHIN) 2 g in sodium chloride 0.9 % 100 mL IVPB     2 g 200 mL/hr over 30 Minutes Intravenous Every 24 hours 05/22/19 2110       Subjective:  Patient was seen and examined, stable Sleepy,  Postop day #0  Objective: Vitals:   05/25/19 1845 05/25/19 1942 05/26/19 0405 05/26/19 0801  BP: (!) 154/65 136/75 132/80 116/64  Pulse: 99 100 94 96  Resp: _0 Temp: 97.8 F (36.6 C) 98.1 F (36.7 C) 98.4 F (36.9 C) 98.2 F (36.8 C)  TempSrc: Oral Oral Oral Oral  SpO2: 95% (!) 57% 100% 99%  Weight: 97.8 kg       Intake/Output Summary (Last 24 hours) at 05/26/2019 1103 Last data filed at 05/26/2019 0900 Gross per 24 hour  Intake 480 ml  Output 2900 ml  Net -2420 ml   Filed Weights   05/23/19 1707 05/25/19 1450 05/25/19 1845  Weight: 98.2 kg 99.8 kg 97.8 kg   BP 116/64 (BP Location: Left Arm)   Pulse 96   Temp 98.2 F (36.8 C) (Oral)   Resp 16   Wt 97.8 kg   SpO2 99%   BMI 30.94 kg/m    Physical Exam  Constitution:  Alert, cooperative, no distress,  Psychiatric: Normal and stable mood and affect, cognition intact,   HEENT: Normocephalic, PERRL, otherwise with in Normal limits  Chest:Chest symmetric Cardio vascular:  S1/S2, RRR, No murmure, No Rubs or Gallops  pulmonary: Clear to auscultation bilaterally,  respirations unlabored, negative wheezes / crackles Abdomen: Soft, non-tender, non-distended, bowel sounds,no masses, no organomegaly Neuro: CNII-XII intact. , normal motor and sensation, reflexes intact  Musculoskeletal:  No changes. As a right arm AV fistula with a palpable thrill and auscultated bruit Skin: Dry warm to touch, right foot/second toe surgical wound, wrapped    .  Data Reviewed: I have personally reviewed following  labs and imaging studies  CBC: Recent Labs  Lab 05/22/19 2016 05/23/19 1139 05/24/19 0722 05/25/19 1606 05/26/19 0434  WBC 12.3* 16.4* 18.8* 11.3* 15.6*  NEUTROABS  --  14.3* 16.4*  --  12.9*  HGB 12.0* 7.5* 7.9* 8.4* 7.7*  HCT 38.6* 23.3* 25.0* 26.8* 24.6*  MCV 88.7 87.6 88.7 88.4 89.8  PLT 384 386 340 334 448   Basic Metabolic Panel: Recent Labs  Lab 05/22/19 2016 05/23/19 1139 05/24/19 0722 05/25/19 1605 05/26/19 0434  NA 133* 134* 135 133* 133*  K 4.5 4.7 4.3 4.2 3.7  CL 89* 94* 92* 93* 91*  CO2 _0 GLUCOSE 105* 144* 131* 132* 141*  BUN 71* 81* 42* 67* 34*  CREATININE 13.26* 14.52* 9.50* 12.43* 7.87*  CALCIUM 9.4 8.9 8.9 8.9 8.8*  MG  --  2.6* 2.3 2.5*  --   PHOS  --  7.9* 6.6* 7.7*  --    GFR: Estimated Creatinine Clearance: 12.7 mL/min (A) (by C-G formula based on SCr of 7.87 mg/dL (H)). Liver Function Tests: Recent Labs  Lab 05/22/19 2016 05/23/19 1139 05/24/19 0722 05/25/19 1605 05/26/19 0434  AST 34 82* 75*  --  18  ALT 39 77* 92*  --  51*  ALKPHOS 192* 248* 330*  --  283*  BILITOT 0.7 1.1 0.7  --  0.8  PROT 7.6 6.6 6.4*  --  6.6  ALBUMIN 2.6* 2.2* 2.1* 2.1* 2.0*  CBG: Recent Labs  Lab 05/24/19 0117 05/25/19 1021 05/26/19 0003  GLUCAP 154* 91 129*    Recent Labs    05/24/19 0722  VITAMINB12 1,936*  FOLATE 22.1  FERRITIN 2,749*  TIBC NOT CALCULATED  IRON 16*  RETICCTPCT 1.3   Sepsis Labs: Recent Labs  Lab 05/22/19 2046  LATICACIDVEN 1.0    Recent Results (from the past 240 hour(s))   SARS Coronavirus 2 [facility] order, Performed in [place] hospital lab)     Status: None   Collection Time: 05/22/19  8:48 PM  Result Value Ref Range Status   SARS Coronavirus 2 NEGATIVE NEGATIVE Final    Comment: (NOTE) If result is NEGATIVE SARS-CoV-2 target nucleic acids are NOT DETECTED. The SARS-CoV-2 RNA is generally detectable in upper and lower  respiratory specimens during the acute phase of infection. The lowest  concentration of SARS-CoV-2 viral copies this assay can detect is 250  copies / mL. A negative result does not preclude SARS-CoV-2 infection  and should not be used as the sole basis for treatment or other  patient management decisions.  A negative result may occur with  improper specimen collection / handling, submission of specimen other  than nasopharyngeal swab, presence of viral mutation(s) within the  areas targeted by this assay, and inadequate number of viral copies  (<250 copies / mL). A negative result must be combined with clinical  observations, patient history, and epidemiological information. If result is POSITIVE SARS-CoV-2 target nucleic acids are DETECTED. The SARS-CoV-2 RNA is generally detectable in upper and lower  respiratory specimens dur ing the acute phase of infection.  Positive  results are indicative of active infection with SARS-CoV-2.  Clinical  correlation with patient history and other diagnostic information is  necessary to determine patient infection status.  Positive results do  not rule out bacterial infection or co-infection with other viruses. If result is PRESUMPTIVE POSTIVE SARS-CoV-2 nucleic acids MAY BE PRESENT.   A presumptive positive result was obtained on the submitted specimen  and confirmed  on repeat testing.  While 2019 novel coronavirus  (SARS-CoV-2) nucleic acids may be present in the submitted sample  additional confirmatory testing may be necessary for epidemiological  and / or clinical management purposes  to  differentiate between  SARS-CoV-2 and other Sarbecovirus currently known to infect humans.  If clinically indicated additional testing with an alternate test  methodology [id number]) is advised. The SARS-CoV-2 RNA is generally  detectable in upper and lower respiratory sp ecimens during the acute  phase of infection. The expected result is Negative. Fact Sheet for Patients:  StrictlyIdeas.no Fact Sheet for Healthcare Providers: BankingDealers.co.za This test is not yet approved or cleared by the [place] FDA and has been authorized for detection and/or diagnosis of SARS-CoV-2 by FDA under an Emergency Use Authorization (EUA).  This EUA will remain in effect (meaning this test can be used) for the duration of the COVID-19 declaration under Section 564(b)(1) of the Act, 21 U.S.C. section 360bbb-3(b)(1), unless the authorization is terminated or revoked sooner. Performed at [facility], [place] [address] [place], [place] [address]   Aerobic/Anaerobic Culture (surgical/deep wound)     Status: None (Preliminary result)   Collection Time: 05/24/19 12:26 AM   Specimen: Wound; Abscess  Result Value Ref Range Status   Specimen Description WOUND RIGHT FOOT  Final   Special Requests ID A  Final   Gram Stain NO WBC SEEN RARE GRAM NEGATIVE RODS   Final   Culture   Final    FEW ESCHERICHIA COLI SUSCEPTIBILITIES TO FOLLOW Performed at [facility], [place] [address], [place], [place] [address]    Report Status PENDING  Incomplete  Culture, blood (Routine X 2) w Reflex to ID Panel     Status: None (Preliminary result)   Collection Time: 05/24/19  7:25 AM   Specimen: BLOOD LEFT HAND  Result Value Ref Range Status   Specimen Description BLOOD LEFT HAND  Final   Special Requests   Final    BOTTLES DRAWN AEROBIC ONLY Blood Culture results may not be optimal due to an inadequate volume of blood received in culture bottles   Culture    Final    NO GROWTH 1 DAY Performed at [facility], [place] [address], [place], [place] [address]    Report Status PENDING  Incomplete  Culture, blood (Routine X 2) w Reflex to ID Panel     Status: None (Preliminary result)   Collection Time: 05/24/19  7:29 AM   Specimen: BLOOD LEFT HAND  Result Value Ref Range Status   Specimen Description BLOOD LEFT HAND  Final   Special Requests   Final    BOTTLES DRAWN AEROBIC ONLY Blood Culture results may not be optimal due to an inadequate volume of blood received in culture bottles   Culture   Final    NO GROWTH 1 DAY Performed at [facility], [place] [address] [place], [place] [address]    Report Status PENDING  Incomplete  Aerobic/Anaerobic Culture (surgical/deep wound)     Status: None (Preliminary result)   Collection Time: 05/25/19  9:25 AM   Specimen: Wound  Result Value Ref Range Status   Specimen Description WOUND RIGHT FOOT  Final   Special Requests NONE  Final   Gram Stain   Final    NO WBC SEEN NO ORGANISMS SEEN Performed at [facility], [address], [place], [place] [address]    Culture RARE GRAM NEGATIVE RODS  Final  Report Status PENDING  Incomplete    Radiology Studies: US Abdomen Limited Ruq  Result Date: 05/25/2019 CLINICAL DATA:  Abnormal LFTs. EXAM: ULTRASOUND ABDOMEN LIMITED RIGHT UPPER QUADRANT COMPARISON:  Ultrasound 10/06/2013. FINDINGS: Gallbladder: Multiple gallstones measuring up to 1.1 cm. Gallbladder wall thickness 2.6 mm. Negative Murphy sign. Common bile duct: Diameter: 4.1 mm Liver: No focal lesion identified. Within normal limits in parenchymal echogenicity. Portal vein is patent on color Doppler imaging with normal direction of blood flow towards the liver. Other: Trace ascites. IMPRESSION: 1. Multiple gallstones. Gallbladder wall thickness 2.6 mm. Negative Murphy sign. No biliary distention. 2. Trace ascites. Electronically Signed   By: [place]   On: 05/25/2019 08:44   Scheduled  Meds: . Chlorhexidine Gluconate Cloth  6 each Topical Q0600  . insulin aspart  0-9 Units Subcutaneous Q4H  . lactobacillus acidophilus  2 tablet Oral TID  . sevelamer carbonate  2.4 g Oral TID WC   Continuous Infusions: . cefTRIAXone (ROCEPHIN)  IV 2 g (05/25/19 2240)  . metronidazole 500 mg (05/26/19 1021)  . vancomycin 1,000 mg (05/25/19 2110)    LOS: 4 days   Deatra Toddrick, MD  [facility] PAGER is on AMION  If 7PM-7AM, please contact night-coverage [email or web address] Password [email or web address] 05/26/2019, 11:03 AM

## 2019-05-26 NOTE — Care Management Important Message (Signed)
Important Message  Patient Details  Name: [name] MRN: [id number] Date of Birth: 1966/07/05   Medicare Important Message Given:  Yes     Waverley Krempasky 05/26/2019, 1:48 PM

## 2019-05-26 NOTE — Progress Notes (Signed)
Subjective:  Patient ID: CHRISTEPHER FRONHEISER, male    DOB: Sep 19, 1966,  MRN: [id number]  Still having pain in the foot. Some sharp pains. Occasional itching in foot. Objective:   Vitals:   05/26/19 0801 05/26/19 1504  BP: 116/64 (!) 141/74  Pulse: 96 95  Resp: 16 16  Temp: 98.2 F (36.8 C) 99.1 F (37.3 C)  SpO2: 99% 100%   General AA&O x3. Normal mood and affect.  Vascular Dorsalis pedis pulse palpable.  PT nonpalpable Right foot warm to touch.  Neurologic Epicritic sensation grossly diminished.  Dermatologic Right foot - wound bed appears with mixed fibrogranular base, mostly viable, no significant necrosis. There is a scant amount of purulence at the proximal aspect with local erythema.  Right 3rd toe with distal pallor, delayed capillary refill.  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   Results for orders placed or performed during the hospital encounter of 05/22/19 (from the past 24 hour(s))  Glucose, capillary     Status: Abnormal   Collection Time: 05/26/19 12:03 AM  Result Value Ref Range   Glucose-Capillary 129 (H) 70 - 99 mg/dL  CBC with Differential/Platelet     Status: Abnormal   Collection Time: 05/26/19  4:34 AM  Result Value Ref Range   WBC 15.6 (H) 4.0 - 10.5 K/uL   RBC 2.74 (L) 4.22 - 5.81 MIL/uL   Hemoglobin 7.7 (L) 13.0 - 17.0 g/dL   HCT 24.6 (L) 39.0 - 52.0 %   MCV 89.8 80.0 - 100.0 fL   MCH 28.1 26.0 - 34.0 pg   MCHC 31.3 30.0 - 36.0 g/dL   RDW 16.9 (H) 11.5 - 15.5 %   Platelets 361 150 - 400 K/uL   nRBC 0.0 0.0 - 0.2 %   Neutrophils Relative % 83 %   Neutro Abs 12.9 (H) 1.7 - 7.7 K/uL   Lymphocytes Relative 6 %   Lymphs Abs 0.9 0.7 - 4.0 K/uL   Monocytes Relative 5 %   Monocytes Absolute 0.8 0.1 - 1.0 K/uL   Eosinophils Relative 4 %   Eosinophils Absolute 0.6 (H) 0.0 - 0.5 K/uL   Basophils Relative 0 %   Basophils Absolute 0.0 0.0 - 0.1 K/uL   Immature Granulocytes 2 %   Abs Immature Granulocytes 0.31 (H) 0.00 - 0.07 K/uL  Comprehensive  metabolic panel     Status: Abnormal   Collection Time: 05/26/19  4:34 AM  Result Value Ref Range   Sodium 133 (L) 135 - 145 mmol/L   Potassium 3.7 3.5 - 5.1 mmol/L   Chloride 91 (L) 98 - 111 mmol/L   CO2 25 22 - 32 mmol/L   Glucose, Bld 141 (H) 70 - 99 mg/dL   BUN 34 (H) 6 - 20 mg/dL   Creatinine, Ser 7.87 (H) 0.61 - 1.24 mg/dL   Calcium 8.8 (L) 8.9 - 10.3 mg/dL   Total Protein 6.6 6.5 - 8.1 g/dL   Albumin 2.0 (L) 3.5 - 5.0 g/dL   AST 18 15 - 41 U/L   ALT 51 (H) 0 - 44 U/L   Alkaline Phosphatase 283 (H) 38 - 126 U/L   Total Bilirubin 0.8 0.3 - 1.2 mg/dL   GFR calc non Af Amer 7 (L) >60 mL/min   GFR calc Af Amer 8 (L) >60 mL/min   Anion gap 17 (H) 5 - 15   Results for orders placed or performed during the hospital encounter of 05/22/19  SARS Coronavirus 2 [facility] order, Performed in [facility]  hospital lab)     Status: None   Collection Time: 05/22/19  8:48 PM  Result Value Ref Range Status   SARS Coronavirus 2 NEGATIVE NEGATIVE Final    Comment: (NOTE) If result is NEGATIVE SARS-CoV-2 target nucleic acids are NOT DETECTED. The SARS-CoV-2 RNA is generally detectable in upper and lower  respiratory specimens during the acute phase of infection. The lowest  concentration of SARS-CoV-2 viral copies this assay can detect is 250  copies / mL. A negative result does not preclude SARS-CoV-2 infection  and should not be used as the sole basis for treatment or other  patient management decisions.  A negative result may occur with  improper specimen collection / handling, submission of specimen other  than nasopharyngeal swab, presence of viral mutation(s) within the  areas targeted by this assay, and inadequate number of viral copies  (<250 copies / mL). A negative result must be combined with clinical  observations, patient history, and epidemiological information. If result is POSITIVE SARS-CoV-2 target nucleic acids are DETECTED. The SARS-CoV-2 RNA is generally detectable in  upper and lower  respiratory specimens dur ing the acute phase of infection.  Positive  results are indicative of active infection with SARS-CoV-2.  Clinical  correlation with patient history and other diagnostic information is  necessary to determine patient infection status.  Positive results do  not rule out bacterial infection or co-infection with other viruses. If result is PRESUMPTIVE POSTIVE SARS-CoV-2 nucleic acids MAY BE PRESENT.   A presumptive positive result was obtained on the submitted specimen  and confirmed on repeat testing.  While 2019 novel coronavirus  (SARS-CoV-2) nucleic acids may be present in the submitted sample  additional confirmatory testing may be necessary for epidemiological  and / or clinical management purposes  to differentiate between  SARS-CoV-2 and other Sarbecovirus currently known to infect humans.  If clinically indicated additional testing with an alternate test  methodology [phone]) is advised. The SARS-CoV-2 RNA is generally  detectable in upper and lower respiratory sp ecimens during the acute  phase of infection. The expected result is Negative. Fact Sheet for Patients:  StrictlyIdeas.no Fact Sheet for Healthcare Providers: BankingDealers.co.za This test is not yet approved or cleared by the [place] FDA and has been authorized for detection and/or diagnosis of SARS-CoV-2 by FDA under an Emergency Use Authorization (EUA).  This EUA will remain in effect (meaning this test can be used) for the duration of the COVID-19 declaration under Section 564(b)(1) of the Act, 21 U.S.C. section 360bbb-3(b)(1), unless the authorization is terminated or revoked sooner. Performed at [facility], [place] [address], [place], [place] [address]   Aerobic/Anaerobic Culture (surgical/deep wound)     Status: None (Preliminary result)   Collection Time: 05/24/19 12:26 AM   Specimen: Wound; Abscess   Result Value Ref Range Status   Specimen Description WOUND RIGHT FOOT  Final   Special Requests ID A  Final   Gram Stain NO WBC SEEN RARE GRAM NEGATIVE RODS   Final   Culture   Final    FEW ESCHERICHIA COLI CULTURE REINCUBATED FOR BETTER GROWTH Performed at [facility], [address], [place], [place] [address]    Report Status PENDING  Incomplete   Organism ID, Bacteria ESCHERICHIA COLI  Final      Susceptibility   Escherichia coli - MIC*    AMPICILLIN 16 INTERMEDIATE Intermediate     CEFAZOLIN <=4 SENSITIVE Sensitive     CEFEPIME <=1 SENSITIVE Sensitive  CEFTAZIDIME <=1 SENSITIVE Sensitive     CEFTRIAXONE <=1 SENSITIVE Sensitive     CIPROFLOXACIN <=0.25 SENSITIVE Sensitive     GENTAMICIN <=1 SENSITIVE Sensitive     IMIPENEM <=0.25 SENSITIVE Sensitive     TRIMETH/SULFA <=20 SENSITIVE Sensitive     AMPICILLIN/SULBACTAM 4 SENSITIVE Sensitive     PIP/TAZO <=4 SENSITIVE Sensitive     Extended ESBL NEGATIVE Sensitive     * FEW ESCHERICHIA COLI  Culture, blood (Routine X 2) w Reflex to ID Panel     Status: None (Preliminary result)   Collection Time: 05/24/19  7:25 AM   Specimen: BLOOD LEFT HAND  Result Value Ref Range Status   Specimen Description BLOOD LEFT HAND  Final   Special Requests   Final    BOTTLES DRAWN AEROBIC ONLY Blood Culture results may not be optimal due to an inadequate volume of blood received in culture bottles   Culture   Final    NO GROWTH 2 DAYS Performed at [facility], [place] [address], [place], [place] [address]    Report Status PENDING  Incomplete  Culture, blood (Routine X 2) w Reflex to ID Panel     Status: None (Preliminary result)   Collection Time: 05/24/19  7:29 AM   Specimen: BLOOD LEFT HAND  Result Value Ref Range Status   Specimen Description BLOOD LEFT HAND  Final   Special Requests   Final    BOTTLES DRAWN AEROBIC ONLY Blood Culture results may not be optimal due to an inadequate volume of blood received in culture  bottles   Culture   Final    NO GROWTH 2 DAYS Performed at [facility], [place] [address] [place], [place] [address]    Report Status PENDING  Incomplete  Aerobic/Anaerobic Culture (surgical/deep wound)     Status: None (Preliminary result)   Collection Time: 05/25/19  9:25 AM   Specimen: Wound  Result Value Ref Range Status   Specimen Description WOUND RIGHT FOOT  Final   Special Requests NONE  Final   Gram Stain   Final    NO WBC SEEN NO ORGANISMS SEEN Performed at [facility], [address] [place], [place] [address]    Culture RARE GRAM NEGATIVE RODS  Final   Report Status PENDING  Incomplete    Assessment & Plan:  Patient was evaluated and treated and all questions answered.  Right Foot Cellulitis, abscess; complex right diabetic foot infection -Labs reviewed. WBC still elevated. -Cultures reviewed -Still with purulence and aiming for source control. As the infection does not appear to be under control plan for repeat debridement tomorrow. Discussed need for further proximal incision as the wound has continued purulence proximally. Discussed the new finding today of pallor to the third toe, concern for ischemia due to subcutaneous necrosis. Discussed possible need for amputation tomorrow. Will evaluate and plan accordingly. -Ultimate plan is once the wound bed is healthy and viable plan for wound VAC application. Once wound VAC is applied he can be discharged home with [facility] to assist in changing. -Continue empiric IV abx.  Evelina Bucy, DPM  Accessible via secure chat for questions or concerns.

## 2019-05-26 NOTE — Progress Notes (Addendum)
[facility] Progress Note   Subjective:   Patient seen in room. Reports he tolerated dailysis well yesterday. Denies edema, SOB, dyspnea, CP, palpitations, abdominal pain, N/V/D. Afebrile and reports foot pain is well controlled. He is still concerned that he does not remember RUQ occurring, and asks to limit blood draws to dialysis when possible. These concerns were discussed with his hospitalist.   Objective Vitals:   05/25/19 1845 05/25/19 1942 05/26/19 0405 05/26/19 0801  BP: (!) 154/65 136/75 132/80 116/64  Pulse: 99 100 94 96  Resp: 18 14 16 16   Temp: 97.8 F (36.6 C) 98.1 F (36.7 C) 98.4 F (36.9 C) 98.2 F (36.8 C)  TempSrc: Oral Oral Oral Oral  SpO2: 95% (!) 57% 100% 99%  Weight: 97.8 kg      Physical Exam General: Well developed male, sitting up in bed, in NAD Heart: RRR, no murmurs, rubs or gallops Lungs: CTA bilaterally without wheezing, rhonchi or rales Abdomen: Soft, non-tender, non-distended. + BS Extremities: R foot wrapped. No peripheral edema Dialysis Access:  RUE AVF + thrill  Additional Objective Labs: Basic Metabolic Panel: Recent Labs  Lab 05/23/19 1139 05/24/19 0722 05/25/19 1605 05/26/19 0434  NA 134* 135 133* 133*  K 4.7 4.3 4.2 3.7  CL 94* 92* 93* 91*  CO2 23 25 23 25   GLUCOSE 144* 131* 132* 141*  BUN 81* 42* 67* 34*  CREATININE 14.52* 9.50* 12.43* 7.87*  CALCIUM 8.9 8.9 8.9 8.8*  PHOS 7.9* 6.6* 7.7*  --    Liver Function Tests: Recent Labs  Lab 05/23/19 1139 05/24/19 0722 05/25/19 1605 05/26/19 0434  AST 82* 75*  --  18  ALT 77* 92*  --  51*  ALKPHOS 248* 330*  --  283*  BILITOT 1.1 0.7  --  0.8  PROT 6.6 6.4*  --  6.6  ALBUMIN 2.2* 2.1* 2.1* 2.0*   CBC: Recent Labs  Lab 05/22/19 2016 05/23/19 1139 05/24/19 0722 05/25/19 1606 05/26/19 0434  WBC 12.3* 16.4* 18.8* 11.3* 15.6*  NEUTROABS  --  14.3* 16.4*  --  12.9*  HGB 12.0* 7.5* 7.9* 8.4* 7.7*  HCT 38.6* 23.3* 25.0* 26.8* 24.6*  MCV 88.7 87.6 88.7  88.4 89.8  PLT 384 386 340 334 361   Blood Culture    Component Value Date/Time   SDES WOUND RIGHT FOOT 05/25/2019 0925   SPECREQUEST NONE 05/25/2019 0925   CULT PENDING 05/25/2019 0925   REPTSTATUS PENDING 05/25/2019 0925    Cardiac Enzymes: No results for input(s): CKTOTAL, CKMB, CKMBINDEX, TROPONINI in the last 168 hours. CBG: Recent Labs  Lab 05/24/19 0117 05/25/19 1021 05/26/19 0003  GLUCAP 154* 91 129*   Iron Studies:  Recent Labs    05/24/19 0722  IRON 16*  TIBC NOT CALCULATED  FERRITIN 2,749*   @lablastinr3 @ Studies/Results: US Abdomen Limited Ruq  Result Date: 05/25/2019 CLINICAL DATA:  Abnormal LFTs. EXAM: ULTRASOUND ABDOMEN LIMITED RIGHT UPPER QUADRANT COMPARISON:  Ultrasound 10/06/2013. FINDINGS: Gallbladder: Multiple gallstones measuring up to 1.1 cm. Gallbladder wall thickness 2.6 mm. Negative Murphy sign. Common bile duct: Diameter: 4.1 mm Liver: No focal lesion identified. Within normal limits in parenchymal echogenicity. Portal vein is patent on color Doppler imaging with normal direction of blood flow towards the liver. Other: Trace ascites. IMPRESSION: 1. Multiple gallstones. Gallbladder wall thickness 2.6 mm. Negative Murphy sign. No biliary distention. 2. Trace ascites. Electronically Signed   By: Marcello Moores  Register   On: 05/25/2019 08:44   Medications: . cefTRIAXone (ROCEPHIN)  IV  2 g (05/25/19 2240)  . metronidazole 500 mg (05/26/19 0222)  . vancomycin 1,000 mg (05/25/19 2110)   . Chlorhexidine Gluconate Cloth  6 each Topical Q0600  . insulin aspart  0-9 Units Subcutaneous Q4H  . lactobacillus acidophilus  2 tablet Oral TID  . sevelamer carbonate  2.4 g Oral TID WC    Dialysis Orders: MWF at [facility] 4hr, 450/800, EDW 98kg, 2K/2.25Ca, UFP #3, AVF, heparin 2000 bolus - Hectoral 80mcg IV q HD - Mircera 67mcg IV q 2 weeks (last 7/15)  Assessment/Plan: 1. R foot cellulitis:S/p second toe partial amputation on 7/25, urgent I&D and second toe amputation  on 05/23/2019 and further debridement 8/5. Feeling well, denies pain. On IV ceftriaxone, metronidazole and vancomycin. Per podiatry. 2. ESRD:Continue MWF schedule, next dialysis8/7. K+ 3.7. 3. Hypertension/volume:BP elevated on presentation, now controlled/slightly elevated.0.2kg below his EDW by weights yesterday after HD.No SOB or edema on exam. Planned for HD 8/7with UF as tolerated. 4. Anemia:Hgb 12.0on admission-Increased compared to baseline, did receive 1 unit PRBC prior to last admission.Hemoglobin now 7.7. Continue aransep 21mcg IV q Wednesday. No iron due to infection/IV antibiotics. Transfuse PRN. 5. Metabolic bone 99991111, corrected 10.4. Will hold hectorol, follow calcium. Phos 5.5, switched to renvela powder during last admission- continue same.Phos 7.7. Consider increasing renvela dose if phos does not improve.Requests labs be limited to HD when possible.  6. Nutrition:Renal diet with fluid restriction. 7.T2DM: Insulin per primary 8. Elevated LFTs: LFTs initially trending up. RUQ [place] was ordered which was reportedly completed, but patient has no memory of this and thinks there was a mistake.  Per primary.   Anice Paganini, PA-C 05/26/2019, 9:33 AM  [facility] Pager: [phone]  Pt seen, examined and agree w A/P as above.  Kelly Splinter  MD 05/26/2019, 1:19 PM

## 2019-05-27 ENCOUNTER — Inpatient Hospital Stay (HOSPITAL_COMMUNITY): Payer: Medicare Other | Admitting: Anesthesiology

## 2019-05-27 ENCOUNTER — Encounter (HOSPITAL_COMMUNITY)
Admission: AD | Disposition: A | Discharge status: Home Health | Payer: Self-pay | Source: Ambulatory Visit | Attending: Internal Medicine

## 2019-05-27 ENCOUNTER — Telehealth: Payer: Self-pay | Admitting: Podiatry

## 2019-05-27 ENCOUNTER — Encounter (HOSPITAL_COMMUNITY): Payer: Self-pay | Admitting: Orthopedic Surgery

## 2019-05-27 DIAGNOSIS — L97514 Non-pressure chronic ulcer of other part of right foot with necrosis of bone: Secondary | ICD-10-CM

## 2019-05-27 DIAGNOSIS — I96 Gangrene, not elsewhere classified: Secondary | ICD-10-CM

## 2019-05-27 HISTORY — PX: AMPUTATION TOE: SHX6595

## 2019-05-27 HISTORY — PX: IRRIGATION AND DEBRIDEMENT FOOT: SHX6602

## 2019-05-27 HISTORY — PX: APPLICATION OF WOUND VAC: SHX5189

## 2019-05-27 LAB — CBC WITH DIFFERENTIAL/PLATELET
Abs Immature Granulocytes: 0.44 10*3/uL — ABNORMAL HIGH (ref 0.00–0.07)
Basophils Absolute: 0 10*3/uL (ref 0.0–0.1)
Basophils Relative: 0 %
Eosinophils Absolute: 0.6 10*3/uL — ABNORMAL HIGH (ref 0.0–0.5)
Eosinophils Relative: 5 %
HCT: 22.5 % — ABNORMAL LOW (ref 39.0–52.0)
Hemoglobin: 6.9 g/dL — CL (ref 13.0–17.0)
Immature Granulocytes: 3 %
Lymphocytes Relative: 7 %
Lymphs Abs: 0.9 10*3/uL (ref 0.7–4.0)
MCH: 27.8 pg (ref 26.0–34.0)
MCHC: 30.7 g/dL (ref 30.0–36.0)
MCV: 90.7 fL (ref 80.0–100.0)
Monocytes Absolute: 0.7 10*3/uL (ref 0.1–1.0)
Monocytes Relative: 6 %
Neutro Abs: 10.3 10*3/uL — ABNORMAL HIGH (ref 1.7–7.7)
Neutrophils Relative %: 79 %
Platelets: 369 10*3/uL (ref 150–400)
RBC: 2.48 MIL/uL — ABNORMAL LOW (ref 4.22–5.81)
RDW: 16.7 % — ABNORMAL HIGH (ref 11.5–15.5)
WBC: 13 10*3/uL — ABNORMAL HIGH (ref 4.0–10.5)
nRBC: 0 % (ref 0.0–0.2)

## 2019-05-27 LAB — PREPARE RBC (CROSSMATCH)

## 2019-05-27 LAB — COMPREHENSIVE METABOLIC PANEL
ALT: 36 U/L (ref 0–44)
AST: 13 U/L — ABNORMAL LOW (ref 15–41)
Albumin: 2.1 g/dL — ABNORMAL LOW (ref 3.5–5.0)
Alkaline Phosphatase: 236 U/L — ABNORMAL HIGH (ref 38–126)
Anion gap: 18 — ABNORMAL HIGH (ref 5–15)
BUN: 51 mg/dL — ABNORMAL HIGH (ref 6–20)
CO2: 22 mmol/L (ref 22–32)
Calcium: 8.9 mg/dL (ref 8.9–10.3)
Chloride: 92 mmol/L — ABNORMAL LOW (ref 98–111)
Creatinine, Ser: 11.07 mg/dL — ABNORMAL HIGH (ref 0.61–1.24)
GFR calc Af Amer: 5 mL/min — ABNORMAL LOW (ref >60)
GFR calc non Af Amer: 5 mL/min — ABNORMAL LOW (ref >60)
Glucose, Bld: 134 mg/dL — ABNORMAL HIGH (ref 70–99)
Potassium: 3.5 mmol/L (ref 3.5–5.1)
Sodium: 132 mmol/L — ABNORMAL LOW (ref 135–145)
Total Bilirubin: 0.9 mg/dL (ref 0.3–1.2)
Total Protein: 6.5 g/dL (ref 6.5–8.1)

## 2019-05-27 LAB — GLUCOSE, CAPILLARY: Glucose-Capillary: 114 mg/dL — ABNORMAL HIGH (ref 70–99)

## 2019-05-27 SURGERY — IRRIGATION AND DEBRIDEMENT FOOT
Anesthesia: Monitor Anesthesia Care | Site: Foot | Laterality: Right

## 2019-05-27 MED ORDER — ONDANSETRON HCL 4 MG/2ML IJ SOLN
INTRAMUSCULAR | Status: DC | PRN
  Administered 2019-05-27: 4 mg via INTRAVENOUS

## 2019-05-27 MED ORDER — BUPIVACAINE HCL (PF) 0.5 % IJ SOLN
INTRAMUSCULAR | Status: AC
  Filled 2019-05-27: qty 30

## 2019-05-27 MED ORDER — FENTANYL CITRATE (PF) 100 MCG/2ML IJ SOLN
INTRAMUSCULAR | Status: AC
  Filled 2019-05-27: qty 2

## 2019-05-27 MED ORDER — FENTANYL CITRATE (PF) 250 MCG/5ML IJ SOLN
INTRAMUSCULAR | Status: AC
  Filled 2019-05-27: qty 5

## 2019-05-27 MED ORDER — CEFAZOLIN SODIUM-DEXTROSE 2-4 GM/100ML-% IV SOLN
2.0000 g | INTRAVENOUS | Status: DC
  Filled 2019-05-27 (×2): qty 100

## 2019-05-27 MED ORDER — DIPHENHYDRAMINE HCL 50 MG/ML IJ SOLN
25.0000 mg | Freq: Once | INTRAMUSCULAR | Status: AC
  Administered 2019-05-27: 25 mg via INTRAVENOUS

## 2019-05-27 MED ORDER — PROMETHAZINE HCL 25 MG/ML IJ SOLN
6.2500 mg | INTRAMUSCULAR | Status: DC | PRN
  Administered 2019-05-27: 12.5 mg via INTRAVENOUS

## 2019-05-27 MED ORDER — DIPHENHYDRAMINE HCL 50 MG/ML IJ SOLN
INTRAMUSCULAR | Status: AC
  Filled 2019-05-27: qty 1

## 2019-05-27 MED ORDER — DEXMEDETOMIDINE HCL IN NACL 200 MCG/50ML IV SOLN
INTRAVENOUS | Status: DC | PRN
  Administered 2019-05-27: 20 ug via INTRAVENOUS

## 2019-05-27 MED ORDER — BUPIVACAINE HCL (PF) 0.5 % IJ SOLN
INTRAMUSCULAR | Status: DC | PRN
  Administered 2019-05-27: 10 mL

## 2019-05-27 MED ORDER — FENTANYL CITRATE (PF) 100 MCG/2ML IJ SOLN
25.0000 ug | INTRAMUSCULAR | Status: DC | PRN
  Administered 2019-05-27 (×2): 25 ug via INTRAVENOUS
  Administered 2019-05-27: 50 ug via INTRAVENOUS

## 2019-05-27 MED ORDER — FENTANYL CITRATE (PF) 100 MCG/2ML IJ SOLN
INTRAMUSCULAR | Status: DC | PRN
  Administered 2019-05-27: 100 ug via INTRAVENOUS
  Administered 2019-05-27: 50 ug via INTRAVENOUS

## 2019-05-27 MED ORDER — PROMETHAZINE HCL 25 MG/ML IJ SOLN
INTRAMUSCULAR | Status: AC
  Filled 2019-05-27: qty 1

## 2019-05-27 MED ORDER — VANCOMYCIN VARIABLE DOSE PER UNSTABLE RENAL FUNCTION (PHARMACIST DOSING): Status: DC

## 2019-05-27 MED ORDER — VANCOMYCIN HCL 1000 MG IV SOLR
INTRAVENOUS | Status: AC
  Filled 2019-05-27: qty 1000

## 2019-05-27 MED ORDER — ACETAMINOPHEN 500 MG PO TABS
1000.0000 mg | ORAL_TABLET | Freq: Once | ORAL | Status: AC
  Administered 2019-05-27: 1000 mg via ORAL

## 2019-05-27 MED ORDER — 0.9 % SODIUM CHLORIDE (POUR BTL) OPTIME
TOPICAL | Status: DC | PRN
  Administered 2019-05-27: 1000 mL

## 2019-05-27 MED ORDER — SODIUM CHLORIDE 0.9 % IR SOLN
Status: DC | PRN
  Administered 2019-05-27: 1000 mL
  Administered 2019-05-27: 3000 mL

## 2019-05-27 MED ORDER — MIDAZOLAM HCL 2 MG/2ML IJ SOLN
0.5000 mg | INTRAMUSCULAR | Status: AC | PRN
  Administered 2019-05-27: 1 mg via INTRAVENOUS

## 2019-05-27 MED ORDER — SODIUM CHLORIDE 0.9 % IV SOLN
INTRAVENOUS | Status: DC
  Administered 2019-05-27: 22:00:00 via INTRAVENOUS

## 2019-05-27 MED ORDER — PROPOFOL 500 MG/50ML IV EMUL
INTRAVENOUS | Status: DC | PRN
  Administered 2019-05-27: 75 ug/kg/min via INTRAVENOUS

## 2019-05-27 MED ORDER — VANCOMYCIN HCL 1000 MG IV SOLR
INTRAVENOUS | Status: DC | PRN
  Administered 2019-05-27: 1000 mg via TOPICAL

## 2019-05-27 MED ORDER — SODIUM CHLORIDE 0.9% IV SOLUTION: Freq: Once | INTRAVENOUS | Status: DC

## 2019-05-27 MED ORDER — ACETAMINOPHEN 500 MG PO TABS
ORAL_TABLET | ORAL | Status: AC
  Administered 2019-05-27: 1000 mg via ORAL
  Filled 2019-05-27: qty 2

## 2019-05-27 MED ORDER — MIDAZOLAM HCL 2 MG/2ML IJ SOLN
INTRAMUSCULAR | Status: AC
  Filled 2019-05-27: qty 2

## 2019-05-27 MED ORDER — SODIUM CHLORIDE 0.9 % IV SOLN
INTRAVENOUS | Status: DC
  Administered 2019-05-27: 17:00:00 via INTRAVENOUS

## 2019-05-27 SURGICAL SUPPLY — 47 items
BNDG ELASTIC 4X5.8 VLCR STR LF (GAUZE/BANDAGES/DRESSINGS) ×1 IMPLANT
BNDG ESMARK 4X9 LF (GAUZE/BANDAGES/DRESSINGS) IMPLANT
BNDG GAUZE ELAST 4 BULKY (GAUZE/BANDAGES/DRESSINGS) ×1 IMPLANT
CANISTER WOUNDNEG PRESSURE 500 (CANNISTER) ×1 IMPLANT
CHLORAPREP W/TINT 26 (MISCELLANEOUS) ×2 IMPLANT
CONT SPEC 4OZ CLIKSEAL STRL BL (MISCELLANEOUS) ×1 IMPLANT
COVER SURGICAL LIGHT HANDLE (MISCELLANEOUS) ×2 IMPLANT
COVER WAND RF STERILE (DRAPES) ×1 IMPLANT
CUFF TOURN SGL QUICK 18X4 (TOURNIQUET CUFF) IMPLANT
CUFF TOURN SGL QUICK 34 (TOURNIQUET CUFF)
CUFF TRNQT CYL 34X4.125X (TOURNIQUET CUFF) IMPLANT
DRAPE U-SHAPE 47X51 STRL (DRAPES) ×2 IMPLANT
DRSG VAC ATS MED SENSATRAC (GAUZE/BANDAGES/DRESSINGS) ×1 IMPLANT
DRSG VERSA FOAM LRG 10X15 (GAUZE/BANDAGES/DRESSINGS) ×1 IMPLANT
ELECT CAUTERY BLADE 6.4 (BLADE) ×2 IMPLANT
ELECT REM PT RETURN 9FT ADLT (ELECTROSURGICAL) ×2
ELECTRODE REM PT RTRN 9FT ADLT (ELECTROSURGICAL) ×1 IMPLANT
GAUZE SPONGE 4X4 12PLY STRL (GAUZE/BANDAGES/DRESSINGS) ×1 IMPLANT
GLOVE BIO SURGEON STRL SZ7.5 (GLOVE) ×3 IMPLANT
GLOVE BIOGEL PI IND STRL 8 (GLOVE) ×1 IMPLANT
GLOVE BIOGEL PI INDICATOR 8 (GLOVE) ×1
GOWN STRL REUS W/ TWL LRG LVL3 (GOWN DISPOSABLE) ×1 IMPLANT
GOWN STRL REUS W/ TWL XL LVL3 (GOWN DISPOSABLE) ×1 IMPLANT
GOWN STRL REUS W/TWL LRG LVL3 (GOWN DISPOSABLE) ×1
GOWN STRL REUS W/TWL XL LVL3 (GOWN DISPOSABLE) ×1
HANDPIECE INTERPULSE COAX TIP (DISPOSABLE) ×1
KIT BASIN OR (CUSTOM PROCEDURE TRAY) ×2 IMPLANT
KIT TURNOVER KIT B (KITS) ×2 IMPLANT
MANIFOLD NEPTUNE II (INSTRUMENTS) ×2 IMPLANT
NDL BIOPSY JAMSHIDI 8X6 (NEEDLE) IMPLANT
NDL HYPO 25GX1X1/2 BEV (NEEDLE) IMPLANT
NEEDLE BIOPSY JAMSHIDI 8X6 (NEEDLE) IMPLANT
NEEDLE HYPO 25GX1X1/2 BEV (NEEDLE) IMPLANT
NS IRRIG 1000ML POUR BTL (IV SOLUTION) ×2 IMPLANT
PACK ORTHO EXTREMITY (CUSTOM PROCEDURE TRAY) ×2 IMPLANT
PAD ARMBOARD 7.5X6 YLW CONV (MISCELLANEOUS) ×4 IMPLANT
PROBE DEBRIDE SONICVAC MISONIX (TIP) ×1 IMPLANT
SET CYSTO W/LG BORE CLAMP LF (SET/KITS/TRAYS/PACK) ×1 IMPLANT
SET HNDPC FAN SPRY TIP SCT (DISPOSABLE) IMPLANT
SOL PREP POV-IOD 4OZ 10% (MISCELLANEOUS) ×4 IMPLANT
SYR CONTROL 10ML LL (SYRINGE) ×1 IMPLANT
TIP PROBE CYLINDRICAL MISONIX (TIP) ×1 IMPLANT
TOWEL GREEN STERILE (TOWEL DISPOSABLE) ×2 IMPLANT
TOWEL GREEN STERILE FF (TOWEL DISPOSABLE) ×2 IMPLANT
TUBE CONNECTING 12X1/4 (SUCTIONS) ×2 IMPLANT
TUBE IRRIGATION SET MISONIX (TUBING) ×1 IMPLANT
YANKAUER SUCT BULB TIP NO VENT (SUCTIONS) ×2 IMPLANT

## 2019-05-27 NOTE — Anesthesia Procedure Notes (Signed)
Procedure Name: MAC Date/Time: 05/27/2019 5:35 PM Performed by: Eligha Bridegroom, CRNA Pre-anesthesia Checklist: Patient identified, Emergency Drugs available, Suction available, Patient being monitored and Timeout performed Patient Re-evaluated:Patient Re-evaluated prior to induction Oxygen Delivery Method: Nasal cannula Preoxygenation: Pre-oxygenation with 100% oxygen Induction Type: IV induction

## 2019-05-27 NOTE — Progress Notes (Addendum)
[facility] Progress Note   Subjective: Seen on HD. Very pleasant, concerned about getting PRBCs. Going back to OR later this afternoon. Very optimistic for good outcome. No C/O pain R foot.      Objective Vitals:   05/27/19 0649 05/27/19 0700 05/27/19 0730 05/27/19 0800  BP: (!) 156/86 (!) 166/89 (!) 159/85 (!) 155/82  Pulse: 92 89 85 84  Resp:      Temp: 98.5 F (36.9 C)     TempSrc: Oral     SpO2:      Weight: 98.3 kg      Physical Exam General: Pleasant, NAD Heart: S1,S2 RRR Lungs: CTAB A/P Abdomen: S, NT Extremities: No LE edema. Gauze drsg R foot.  Dialysis Access: R AVF + bruit   Additional Objective Labs: Basic Metabolic Panel: Recent Labs  Lab 05/23/19 1139 05/24/19 0722 05/25/19 1605 05/26/19 0434 05/27/19 0500  NA 134* 135 133* 133* 132*  K 4.7 4.3 4.2 3.7 3.5  CL 94* 92* 93* 91* 92*  CO2 23 25 23 25 22   GLUCOSE 144* 131* 132* 141* 134*  BUN 81* 42* 67* 34* 51*  CREATININE 14.52* 9.50* 12.43* 7.87* 11.07*  CALCIUM 8.9 8.9 8.9 8.8* 8.9  PHOS 7.9* 6.6* 7.7*  --   --    Liver Function Tests: Recent Labs  Lab 05/24/19 0722 05/25/19 1605 05/26/19 0434 05/27/19 0500  AST 75*  --  18 13*  ALT 92*  --  51* 36  ALKPHOS 330*  --  283* 236*  BILITOT 0.7  --  0.8 0.9  PROT 6.4*  --  6.6 6.5  ALBUMIN 2.1* 2.1* 2.0* 2.1*   No results for input(s): LIPASE, AMYLASE in the last 168 hours. CBC: Recent Labs  Lab 05/23/19 1139 05/24/19 0722 05/25/19 1606 05/26/19 0434 05/27/19 0500  WBC 16.4* 18.8* 11.3* 15.6* 13.0*  NEUTROABS 14.3* 16.4*  --  12.9* 10.3*  HGB 7.5* 7.9* 8.4* 7.7* 6.9*  HCT 23.3* 25.0* 26.8* 24.6* 22.5*  MCV 87.6 88.7 88.4 89.8 90.7  PLT 386 340 334 361 369   Blood Culture    Component Value Date/Time   SDES WOUND RIGHT FOOT 05/25/2019 0925   SPECREQUEST NONE 05/25/2019 0925   CULT RARE GRAM NEGATIVE RODS 05/25/2019 0925   REPTSTATUS PENDING 05/25/2019 0925    Cardiac Enzymes: No results for input(s):  CKTOTAL, CKMB, CKMBINDEX, TROPONINI in the last 168 hours. CBG: Recent Labs  Lab 05/24/19 0117 05/25/19 1021 05/26/19 0003  GLUCAP 154* 91 129*   Iron Studies: No results for input(s): IRON, TIBC, TRANSFERRIN, FERRITIN in the last 72 hours. @lablastinr3 @ Studies/Results: No results found. Medications: . cefTRIAXone (ROCEPHIN)  IV 2 g (05/26/19 2240)  . metronidazole 500 mg (05/27/19 0155)  . vancomycin 1,000 mg (05/25/19 2110)   . sodium chloride   Intravenous Once  . Chlorhexidine Gluconate Cloth  6 each Topical Q0600  . insulin aspart  0-9 Units Subcutaneous Q4H  . lactobacillus acidophilus  2 tablet Oral TID  . sevelamer carbonate  2.4 g Oral TID WC     Dialysis Orders: MWF at [facility] 4hr, 450/800, EDW 98kg, 2K/2.25Ca, UFP #3, AVF, heparin 2000 bolus - Hectoral 16mcg IV q HD - Mircera 21mcg IV q 2 weeks (last 7/15)  Assessment/Plan: 1. R foot cellulitis:S/p second toe partial amputation on 7/25, urgent I&D and second toe amputation on 8/3/2020and further debridement 8/5. Supposed to go back to OR today for further debridement/wound vac placement. Feeling well, denies pain. On IVceftriaxone, metronidazole  and vancomycin. Per podiatry. 2. ESRD: HD MWF via AVF. K+ 3.5 Change to 4.0 k bath. Hold heparin.  3. Hypertension/volume:Not eating well in hospital, probably losing wt. BP high agrees to challenge current UFG 0.5 kg. Will need lower EDW on DC.  4. Anemia:Hgb 12.0on admission-HGB 6.9 getting 2 units of PRBCs with HD today. Continue ESA.  5. Metabolic bone 99991111, corrected 10.4. Will hold hectorol, follow calcium. Phos 5.5, switched to renvela powder during last admission- continue same.Phos 7.7. Consider increasing renvela dose if phos does not improve.Requests labs be limited to HD when possible.  6. Nutrition:Renal diet with fluid restriction. 7. T2DM: Insulin per primary 8.    Elevated LFTs: LFTs initially trending up now improving. Per  primary  Jimmye Norman. Brown NP-C 05/27/2019, 9:35 AM  [facility] [phone]  Pt seen, examined and agree w A/P as above.  Kelly Splinter  MD 05/27/2019, 2:40 PM

## 2019-05-27 NOTE — Progress Notes (Signed)
CRITICAL VALUE ALERT  Critical Value:  HGB 6.9  Date & Time Notied:  0748  Provider Notified: [phone]  Orders Received/Actions taken: Per Md Schertz ordered 2 units of blood

## 2019-05-27 NOTE — Anesthesia Postprocedure Evaluation (Signed)
Anesthesia Post Note  Patient: [name]  Procedure(s) Performed: IRRIGATION AND DEBRIDEMENT FOOT (Right Foot) Amputation Toe (Right Foot) Application Of Wound Vac (Right Foot)     Patient location during evaluation: PACU Anesthesia Type: MAC Level of consciousness: awake and alert Pain management: pain level controlled Vital Signs Assessment: post-procedure vital signs reviewed and stable Respiratory status: spontaneous breathing, nonlabored ventilation, respiratory function stable and patient connected to nasal cannula oxygen Cardiovascular status: stable and blood pressure returned to baseline Postop Assessment: no apparent nausea or vomiting Anesthetic complications: no    Last Vitals:  Vitals:   05/27/19 1947 05/27/19 2011  BP: (!) 160/70 (!) 158/88  Pulse: 90 88  Resp: 19 14  Temp: 37 C (!) 36.4 C  SpO2: 100% 100%    Last Pain:  Vitals:   05/27/19 2011  TempSrc: Oral  PainSc: 0-No pain                 Tiajuana Amass

## 2019-05-27 NOTE — Progress Notes (Signed)
Subjective:  Patient ID: [name], male    DOB: Sep 01, 1966,  MRN: [id number]  Seen in pre-op. Last ate at 0930. Denies complaints. Objective:   Vitals:   05/27/19 1123 05/27/19 1512  BP: (!) 168/83 (!) 168/93  Pulse: (!) 103 98  Resp:  16  Temp: 98.4 F (36.9 C) 98.4 F (36.9 C)  SpO2: 100% 100%   General AA&O x3. Normal mood and affect.  Vascular Dorsalis pedis pulse palpable.  PT nonpalpable Right foot warm to touch.  Neurologic Epicritic sensation grossly diminished.  Dermatologic Right foot dressing intact but slightly disheveled, third toe worsening ischemia today. Without capillary refill.  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   Results for orders placed or performed during the hospital encounter of 05/22/19 (from the past 24 hour(s))  CBC with Differential/Platelet     Status: Abnormal   Collection Time: 05/27/19  5:00 AM  Result Value Ref Range   WBC 13.0 (H) 4.0 - 10.5 K/uL   RBC 2.48 (L) 4.22 - 5.81 MIL/uL   Hemoglobin 6.9 (LL) 13.0 - 17.0 g/dL   HCT 22.5 (L) 39.0 - 52.0 %   MCV 90.7 80.0 - 100.0 fL   MCH 27.8 26.0 - 34.0 pg   MCHC 30.7 30.0 - 36.0 g/dL   RDW 16.7 (H) 11.5 - 15.5 %   Platelets 369 150 - 400 K/uL   nRBC 0.0 0.0 - 0.2 %   Neutrophils Relative % 79 %   Neutro Abs 10.3 (H) 1.7 - 7.7 K/uL   Lymphocytes Relative 7 %   Lymphs Abs 0.9 0.7 - 4.0 K/uL   Monocytes Relative 6 %   Monocytes Absolute 0.7 0.1 - 1.0 K/uL   Eosinophils Relative 5 %   Eosinophils Absolute 0.6 (H) 0.0 - 0.5 K/uL   Basophils Relative 0 %   Basophils Absolute 0.0 0.0 - 0.1 K/uL   Immature Granulocytes 3 %   Abs Immature Granulocytes 0.44 (H) 0.00 - 0.07 K/uL  Comprehensive metabolic panel     Status: Abnormal   Collection Time: 05/27/19  5:00 AM  Result Value Ref Range   Sodium 132 (L) 135 - 145 mmol/L   Potassium 3.5 3.5 - 5.1 mmol/L   Chloride 92 (L) 98 - 111 mmol/L   CO2 22 22 - 32 mmol/L   Glucose, Bld 134 (H) 70 - 99 mg/dL   BUN 51 (H) 6 - 20 mg/dL   Creatinine, Ser 11.07 (H) 0.61 - 1.24 mg/dL   Calcium 8.9 8.9 - 10.3 mg/dL   Total Protein 6.5 6.5 - 8.1 g/dL   Albumin 2.1 (L) 3.5 - 5.0 g/dL   AST 13 (L) 15 - 41 U/L   ALT 36 0 - 44 U/L   Alkaline Phosphatase 236 (H) 38 - 126 U/L   Total Bilirubin 0.9 0.3 - 1.2 mg/dL   GFR calc non Af Amer 5 (L) >60 mL/min   GFR calc Af Amer 5 (L) >60 mL/min   Anion gap 18 (H) 5 - 15  Type and screen Sharon     Status: None (Preliminary result)   Collection Time: 05/27/19  8:11 AM  Result Value Ref Range   ABO/RH(D) O POS    Antibody Screen NEG    Sample Expiration 05/30/2019,2359    Unit Number [id number]    Blood Component Type RED CELLS,LR    Unit division 00    Status of Unit ISSUED    Transfusion Status OK TO TRANSFUSE  Crossmatch Result      Compatible Performed at [facility], [place] [address] [place], [place] [address]    Unit Number [id number]    Blood Component Type RED CELLS,LR    Unit division 00    Status of Unit ISSUED    Transfusion Status OK TO TRANSFUSE    Crossmatch Result Compatible   Prepare RBC     Status: None   Collection Time: 05/27/19  8:11 AM  Result Value Ref Range   Order Confirmation      ORDER PROCESSED BY BLOOD BANK Performed at [facility], [place] [address], [place], [place] [address]    Results for orders placed or performed during the hospital encounter of 05/22/19  SARS Coronavirus 2 [facility] order, Performed in [place] hospital lab)     Status: None   Collection Time: 05/22/19  8:48 PM  Result Value Ref Range Status   SARS Coronavirus 2 NEGATIVE NEGATIVE Final    Comment: (NOTE) If result is NEGATIVE SARS-CoV-2 target nucleic acids are NOT DETECTED. The SARS-CoV-2 RNA is generally detectable in upper and lower  respiratory specimens during the acute phase of infection. The lowest  concentration of SARS-CoV-2 viral copies this assay can detect is 250  copies / mL. A negative result does not preclude  SARS-CoV-2 infection  and should not be used as the sole basis for treatment or other  patient management decisions.  A negative result may occur with  improper specimen collection / handling, submission of specimen other  than nasopharyngeal swab, presence of viral mutation(s) within the  areas targeted by this assay, and inadequate number of viral copies  (<250 copies / mL). A negative result must be combined with clinical  observations, patient history, and epidemiological information. If result is POSITIVE SARS-CoV-2 target nucleic acids are DETECTED. The SARS-CoV-2 RNA is generally detectable in upper and lower  respiratory specimens dur ing the acute phase of infection.  Positive  results are indicative of active infection with SARS-CoV-2.  Clinical  correlation with patient history and other diagnostic information is  necessary to determine patient infection status.  Positive results do  not rule out bacterial infection or co-infection with other viruses. If result is PRESUMPTIVE POSTIVE SARS-CoV-2 nucleic acids MAY BE PRESENT.   A presumptive positive result was obtained on the submitted specimen  and confirmed on repeat testing.  While 2019 novel coronavirus  (SARS-CoV-2) nucleic acids may be present in the submitted sample  additional confirmatory testing may be necessary for epidemiological  and / or clinical management purposes  to differentiate between  SARS-CoV-2 and other Sarbecovirus currently known to infect humans.  If clinically indicated additional testing with an alternate test  methodology [phone]) is advised. The SARS-CoV-2 RNA is generally  detectable in upper and lower respiratory sp ecimens during the acute  phase of infection. The expected result is Negative. Fact Sheet for Patients:  StrictlyIdeas.no Fact Sheet for Healthcare Providers: BankingDealers.co.za This test is not yet approved or cleared by the  [place] FDA and has been authorized for detection and/or diagnosis of SARS-CoV-2 by FDA under an Emergency Use Authorization (EUA).  This EUA will remain in effect (meaning this test can be used) for the duration of the COVID-19 declaration under Section 564(b)(1) of the Act, 21 U.S.C. section 360bbb-3(b)(1), unless the authorization is terminated or revoked sooner. Performed at [facility], [place] [address] [place], [place] [address]   Aerobic/Anaerobic Culture (surgical/deep wound)  Status: None (Preliminary result)   Collection Time: 05/24/19 12:26 AM   Specimen: Wound; Abscess  Result Value Ref Range Status   Specimen Description WOUND RIGHT FOOT  Final   Special Requests ID A  Final   Gram Stain NO WBC SEEN RARE GRAM NEGATIVE RODS   Final   Culture   Final    FEW ESCHERICHIA COLI FEW KLEBSIELLA PNEUMONIAE HOLDING FOR POSSIBLE ANAEROBE Performed at [facility], [place] [address] [place], [place] [address]    Report Status PENDING  Incomplete   Organism ID, Bacteria ESCHERICHIA COLI  Final   Organism ID, Bacteria KLEBSIELLA PNEUMONIAE  Final      Susceptibility   Escherichia coli - MIC*    AMPICILLIN 16 INTERMEDIATE Intermediate     CEFAZOLIN <=4 SENSITIVE Sensitive     CEFEPIME <=1 SENSITIVE Sensitive     CEFTAZIDIME <=1 SENSITIVE Sensitive     CEFTRIAXONE <=1 SENSITIVE Sensitive     CIPROFLOXACIN <=0.25 SENSITIVE Sensitive     GENTAMICIN <=1 SENSITIVE Sensitive     IMIPENEM <=0.25 SENSITIVE Sensitive     TRIMETH/SULFA <=20 SENSITIVE Sensitive     AMPICILLIN/SULBACTAM 4 SENSITIVE Sensitive     PIP/TAZO <=4 SENSITIVE Sensitive     Extended ESBL NEGATIVE Sensitive     * FEW ESCHERICHIA COLI   Klebsiella pneumoniae - MIC*    AMPICILLIN >=32 RESISTANT Resistant     CEFAZOLIN <=4 SENSITIVE Sensitive     CEFEPIME <=1 SENSITIVE Sensitive     CEFTAZIDIME <=1 SENSITIVE Sensitive     CEFTRIAXONE <=1 SENSITIVE Sensitive     CIPROFLOXACIN <=0.25 SENSITIVE  Sensitive     GENTAMICIN <=1 SENSITIVE Sensitive     IMIPENEM <=0.25 SENSITIVE Sensitive     TRIMETH/SULFA <=20 SENSITIVE Sensitive     AMPICILLIN/SULBACTAM 4 SENSITIVE Sensitive     PIP/TAZO 8 SENSITIVE Sensitive     Extended ESBL NEGATIVE Sensitive     * FEW KLEBSIELLA PNEUMONIAE  Culture, blood (Routine X 2) w Reflex to ID Panel     Status: None (Preliminary result)   Collection Time: 05/24/19  7:25 AM   Specimen: BLOOD LEFT HAND  Result Value Ref Range Status   Specimen Description BLOOD LEFT HAND  Final   Special Requests   Final    BOTTLES DRAWN AEROBIC ONLY Blood Culture results may not be optimal due to an inadequate volume of blood received in culture bottles   Culture   Final    NO GROWTH 3 DAYS Performed at [facility], [address], [place], [place] [address]    Report Status PENDING  Incomplete  Culture, blood (Routine X 2) w Reflex to ID Panel     Status: None (Preliminary result)   Collection Time: 05/24/19  7:29 AM   Specimen: BLOOD LEFT HAND  Result Value Ref Range Status   Specimen Description BLOOD LEFT HAND  Final   Special Requests   Final    BOTTLES DRAWN AEROBIC ONLY Blood Culture results may not be optimal due to an inadequate volume of blood received in culture bottles   Culture   Final    NO GROWTH 3 DAYS Performed at [facility], [place] [address], [place], [place] [address]    Report Status PENDING  Incomplete  Aerobic/Anaerobic Culture (surgical/deep wound)     Status: None (Preliminary result)   Collection Time: 05/25/19  9:25 AM   Specimen: Wound  Result Value Ref Range Status   Specimen Description WOUND  RIGHT FOOT  Final   Special Requests NONE  Final   Gram Stain   Final    NO WBC SEEN NO ORGANISMS SEEN Performed at [facility], [place] [address] [place], [place] [address]    Culture   Final    RARE ESCHERICHIA COLI RARE KLEBSIELLA PNEUMONIAE    Report Status PENDING  Incomplete   Organism ID, Bacteria ESCHERICHIA  COLI  Final      Susceptibility   Escherichia coli - MIC*    AMPICILLIN 16 INTERMEDIATE Intermediate     CEFAZOLIN <=4 SENSITIVE Sensitive     CEFEPIME <=1 SENSITIVE Sensitive     CEFTAZIDIME <=1 SENSITIVE Sensitive     CEFTRIAXONE <=1 SENSITIVE Sensitive     CIPROFLOXACIN <=0.25 SENSITIVE Sensitive     GENTAMICIN <=1 SENSITIVE Sensitive     IMIPENEM <=0.25 SENSITIVE Sensitive     TRIMETH/SULFA <=20 SENSITIVE Sensitive     AMPICILLIN/SULBACTAM 4 SENSITIVE Sensitive     PIP/TAZO <=4 SENSITIVE Sensitive     Extended ESBL NEGATIVE Sensitive     * RARE ESCHERICHIA COLI    Assessment & Plan:  Patient was evaluated and treated and all questions answered.  Right Foot Cellulitis, abscess; complex right diabetic foot infection -Right 3rd toe more ischemic today. Discussed possible need for amputation of the right 3rd toe today. Added on to consent. Patient initialed, witnessed by RN.  -To OR today for right foot debridement and irrigation, possible wound VAC application, possible 3rd toe amputation. -Will continue to follow post-operatively.  Evelina Bucy, DPM  Accessible via secure chat for questions or concerns.

## 2019-05-27 NOTE — Brief Op Note (Signed)
05/27/2019  6:41 PM  PATIENT:  [name]  53 y.o. male  PRE-OPERATIVE DIAGNOSIS:  Abscess foot right, ulcer right foot  POST-OPERATIVE DIAGNOSIS:  cellulitis and abscess, right foot  PROCEDURE:  Procedure(s) with comments: IRRIGATION AND DEBRIDEMENT FOOT (Right) Amputation Toe (Right) - right third toe Application Of Wound Vac (Right)  SURGEON:  Surgeon(s) and Role:    Evelina Bucy, DPM - Primary  PHYSICIAN ASSISTANT:   ASSISTANTS: none   ANESTHESIA:   none  EBL:  30 mL   BLOOD ADMINISTERED:none  DRAINS: Wound VAC   LOCAL MEDICATIONS USED:  MARCAINE    and Amount: 10 ml  SPECIMEN:   ID Type Source Tests Collected by Time Destination  1 : right third toe Amputation Toe, Right SURGICAL PATHOLOGY Evelina Bucy, DPM 05/27/2019 1758       DISPOSITION OF SPECIMEN:  PATHOLOGY  COUNTS:  YES  TOURNIQUET:  * No tourniquets in log *  DICTATION: .Dragon Dictation  PLAN OF CARE: transfer to floor  PATIENT DISPOSITION:  PACU - hemodynamically stable.   Delay start of Pharmacological VTE agent (>24hrs) due to surgical blood loss or risk of bleeding: not applicable

## 2019-05-27 NOTE — Progress Notes (Signed)
Pharmacy Antibiotic Note  [name] is a 53 y.o. male admitted on 05/22/2019 with cellulitis.   Discussed antibiotics with the MD today - given E.coli and Klebsiella isolated with noted sensitivies, the plan is to d/c the Vancomycin and Rocephin and transition to Cefazolin, to continue Flagyl for now.   Plan: - D/c Vanc/CTX - Start Cefazolin 2g on MWF @ 1800 - Continue Flagyl 500 mg IV every 8 hours - Will continue to follow HD schedule/duration, culture results, LOT, and antibiotic de-escalation plans    Temp (24hrs), Avg:98.5 F (36.9 C), Min:98.1 F (36.7 C), Max:99.1 F (37.3 C)  Recent Labs  Lab 05/22/19 2046 05/23/19 1139 05/24/19 0722 05/25/19 1605 05/25/19 1606 05/26/19 0434 05/27/19 0500  WBC  --  16.4* 18.8*  --  11.3* 15.6* 13.0*  CREATININE  --  14.52* 9.50* 12.43*  --  7.87* 11.07*  LATICACIDVEN 1.0  --   --   --   --   --   --   VANCORANDOM  --   --  31  --   --   --   --     Estimated Creatinine Clearance: 9.1 mL/min (A) (by C-G formula based on SCr of 11.07 mg/dL (H)).    Allergies  Allergen Reactions  . Amoxicillin Itching    Did it involve swelling of the face/tongue/throat, SOB, or low BP? Unknown Did it involve sudden or severe rash/hives, skin peeling, or any reaction on the inside of your mouth or nose? Unknown Did you need to seek medical attention at a hospital or doctor's office? Unknown When did it last happen?20 years ago If all above answers are "NO", may proceed with cephalosporin use.   Marland Kitchen Hydromorphone Itching    Patient states he may take with benadryl. Makes feet itch.  Marland Kitchen Percocet [Oxycodone-Acetaminophen] Itching and Other (See Comments)    Legs only. Itching and burning feeling.   Keflex/Clinda PTA Vanc 8/3 >>  8/7 CTX 8/2 >> 8/6 Clinda 8/2 >> 8/3 Flagyl 8/3 >> Cefazolin 8/7 >>  Last admit:  7/17 WCx >> E.coli (pan-S) + Strep agalactiae 7/25 WCx (R-toe) >> E.coli, H influ (B-lactamase neg), B vulgatus (B-lactamase  positive)  8/2 COVID >> neg 8/2 BCx >> ngtd 8/4 R-foot WCx (surgical) >> E.coli (pan-S except I-amp), K PNA (pan-S except R-amp), holding for anaerobes 8/5 R-foot WCx (surgical) >> E.coli (pan-S except I-amp)  Thank you for allowing pharmacy to be a part of this patient's care.  Alycia Rossetti, PharmD, BCPS Clinical Pharmacist Clinical phone for 05/27/2019: [phone] 05/27/2019 10:03 AM   **Pharmacist phone directory can now be found on [email or web address] ([email or web address]).  Listed under [place].

## 2019-05-27 NOTE — Anesthesia Preprocedure Evaluation (Signed)
Anesthesia Evaluation  Patient identified by MRN, date of birth, ID band Patient awake    Reviewed: Allergy & Precautions, NPO status , Patient's Chart, lab work & pertinent test results  History of Anesthesia Complications (+) PONV and history of anesthetic complications  Airway Mallampati: II  TM Distance: >3 FB Neck ROM: Full    Dental  (+) Dental Advisory Given   Pulmonary neg pulmonary ROS,    Pulmonary exam normal        Cardiovascular hypertension, Normal cardiovascular exam     Neuro/Psych  Headaches, negative psych ROS   GI/Hepatic negative GI ROS,  Elevated LFTs    Endo/Other  diabetes, Type 2 Obesity   Renal/GU ESRF and DialysisRenal disease     Musculoskeletal negative musculoskeletal ROS (+)   Abdominal   Peds  Hematology  (+) anemia ,  Leukocytosis    Anesthesia Other Findings   Reproductive/Obstetrics                             Anesthesia Physical  Anesthesia Plan  ASA: III  Anesthesia Plan: MAC   Post-op Pain Management:    Induction: Intravenous  PONV Risk Score and Plan: 2 and Treatment may vary due to age or medical condition and Propofol infusion  Airway Management Planned: Natural Airway and Simple Face Mask  Additional Equipment: None  Intra-op Plan:   Post-operative Plan:   Informed Consent: I have reviewed the patients History and Physical, chart, labs and discussed the procedure including the risks, benefits and alternatives for the proposed anesthesia with the patient or authorized representative who has indicated his/her understanding and acceptance.       Plan Discussed with: Anesthesiologist  Anesthesia Plan Comments:         Anesthesia Quick Evaluation

## 2019-05-27 NOTE — Op Note (Signed)
Patient Name: [name] DOB: 1966/01/17  MRN: [id number]   Date of Service: 05/27/2019 Surgeon: Dr. Hardie Pulley, DPM Assistants: None Pre-operative Diagnosis:  Cellulitis and abscess of right foot, gangrene of right third toe Post-operative Diagnosis:  Same Procedures:  1) Amputation right third toe metatarsophalangeal joint  2) Debridement irrigation of right foot down to bone  3) Application of wound VAC Pathology/Specimens: ID Type Source Tests Collected by Time Destination  1 : right third toe Amputation Toe, Right SURGICAL PATHOLOGY Evelina Bucy, DPM 05/27/2019 1758    Anesthesia: MAC local Hemostasis: * No tourniquets in log * Estimated Blood Loss: 30 mL Materials: * No implants in log * Medications: 1g Vancomycin powder Complications: none  Indications for Procedure:  This is a 53 y.o. male with a complex right foot infection.  He had continued purulence yesterday and discussed he would benefit from repeat debridement.  In preop the third toe was noted to have worsening discoloration and this was additionally discussed to proceed with amputation today.    Procedure in Detail: Patient was identified in pre-operative holding area. Formal consent was signed and the right lower extremity was marked. Patient was brought back to the operating room. Anesthesia was induced. The extremity was prepped and draped in the usual sterile fashion. Timeout was taken to confirm patient name, laterality, and procedure prior to incision.    Debridement of Right Foot Plantar Wound Attention was then directed to the right foot.  The previous sutures removed.  The foot was manually expressed but no purulence was expressible today.  The large plantar wound was copiously irrigated with pulse lavage.  The wound was then thoroughly sharply excisionally debrided with blade and forcep.  Debridement was carried down to the second metatarsal bone.  The plantar plates and tendons were excised from the  plantar aspect of the metatarsal head.  It was further copiously debrided with an ultrasonic debrider.  Following thorough debridement the wound measured 5 x 8 cm.  The wound was debrided down to level of the bone.  Right 3rd Toe Amputation Attention was then directed to the right third toe.  The toe appeared devitalized.  The surrounding tissue appeared friable and did bleed during debridement.  The toe was disarticulated the proximal interphalangeal joint.  No bleeding was noted.  The toe was then disarticulated the metatarsal phalangeal joint.  Better bleeding was noted at the skin margin.  The metatarsal head appeared healthy and viable.  1 g of vancomycin powder was then applied topically to the wound.  Application of Wound VAC The foot was then dressed with a wound VAC.  White foam was applied to the cover the metatarsals.  Black foam was then applied to the entire wound base.  This was adhered with transparent dressing.  The VAC was bridged to the left the pad was applied dorsally.  Patient tolerated the procedure well.   Disposition: Following a period of post-operative monitoring, patient will be transferred back to the floor.  No purulence was noted today although the third toe was devitalized and there was necrotic tissue at the third toe and surrounding area. At this point we are not planning for another surgery and instead will monitor for progress with wound VAC..  I will leave open the possibility for further surgery if he is amenable as I think he would benefit from either transmetatarsal amputation or second third metatarsal resection with possible closure.  In discussing today surgery with him in the postoperative area  he states he does not want to have more surgery and would rather go to hospice to die.  I am hopeful that this is just perioperative stress and the effects of anesthesia.

## 2019-05-27 NOTE — Plan of Care (Signed)
  Problem: Education: Goal: Knowledge of General Education information will improve Description Including pain rating scale, medication(s)/side effects and non-pharmacologic comfort measures Outcome: Progressing   

## 2019-05-27 NOTE — Progress Notes (Signed)
Gave report to [employer], RN in Hemodialysis

## 2019-05-27 NOTE — Telephone Encounter (Signed)
Called patient's wife for post-op update

## 2019-05-27 NOTE — Transfer of Care (Signed)
Immediate Anesthesia Transfer of Care Note  Patient: [name]  Procedure(s) Performed: IRRIGATION AND DEBRIDEMENT FOOT (Right Foot) Amputation Toe (Right Foot) Application Of Wound Vac (Right Foot)  Patient Location: PACU  Anesthesia Type:MAC  Level of Consciousness: awake and alert   Airway & Oxygen Therapy: Patient Spontanous Breathing  Post-op Assessment: Report given to RN and Post -op Vital signs reviewed and stable  Post vital signs: Reviewed and stable  Last Vitals:  Vitals Value Taken Time  BP 145/77 05/27/19 1847  Temp 36.6 C 05/27/19 1847  Pulse    Resp 20 05/27/19 1847  SpO2 99 % 05/27/19 1847    Last Pain:  Vitals:   05/27/19 1847  TempSrc:   PainSc: 5       Patients Stated Pain Goal: 3 (89/38/10 1751)  Complications: No apparent anesthesia complications

## 2019-05-27 NOTE — Progress Notes (Signed)
Pt transported off floor via bed to Dialysis.

## 2019-05-27 NOTE — Progress Notes (Signed)
Pt is insistent that MD told him not to drink anything prior to surgery. Declined to drink the gatorade.

## 2019-05-27 NOTE — Progress Notes (Signed)
PROGRESS NOTE    [name]  NAT:[id number] DOB: Feb 28, 1966 DOA: 05/22/2019 PCP: Hayden Rasmussen, MD   Brief Narrative:  HPI per Dr. Shela Leff on 05/22/2019 [name] is a 53 y.o. male with medical history significant of diet-controlled type 2 diabetes, hypertension, ESRD on HD, obesity, iron deficiency anemia presenting to the hospital as a direct admit by Dr. March Rummage from podiatry.  Status post recent right foot second toe partial amputation for osteomyelitis on 7/25.  He was discharged with a 2-week course of Keflex.  ABIs revealed noncompressible right lower extremity arteries and noncompressible left lower extremity arteries.  Left toe brachial index abnormal.  Patient also underwent one-vessel posterior tibial artery runoff of bilateral foot.  Patient was seen by Dr. March Rummage today and found to have temperature 101.6 F.  Surgical site with delayed healing but without frank purulence.  He had erythema and swelling of the right lower extremity.  Given signs of infection he was sent to the hospital for IV antibiotics and possible surgical debridement.  Patient states he has been having low-grade fevers since he left the hospital.  Last night his temperature went up to 101 F range.  He started noticing erythema and swelling extending up from his foot to his ankle.  He is having some discomfort in his foot from wearing an Haematologist.  States he has not been getting Keflex at dialysis as planned.  States 2 days ago Dr. March Rummage switched him to clindamycin and he has been taking this medication.  Denies any shortness of breath, cough, nausea, vomiting, or abdominal pain.  Interim History  Patient's foot is improved per the patient but he is having significant amount of gas due to his lactose intolerance.  Patient was dialyzed today and likely will undergo surgical intervention.  Will defer further imaging studies to podiatry and may consider an MRI of the foot if patient does not improve  significantly.  Podiatry evaluated last evening and Dr. March Rummage felt that the patient had an abscess so he underwent emergent I&D last night with improved swelling and pain in the leg .   Subjective:  Status post repeat debridement and I&D and wound placement on 05/25/2019. Patient hemoglobin noted to be 6.9 today, tachycardic at 103, blood pressure 168/83 Patient was ordered for 2 units of packed red blood cells to be transfused with dialysis.  He is currently on hemodialysis.  No issues overnight, currently remains stable denies any shortness of breath or chest pain.   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 March Rummage: s/p successful surgical debridement, further I&D. Intraoperative cultures-pending  -Continue-IV Vancomycin, Ceftriaxone, and clindamycin based on patient's allergy profile  Will be DC'd today 05/27/2019 -Preliminary wound cultures are growing Klebsiella pneumonia, E. Coli Due to presumed sensitivity, antibiotics will be adjusted >> to cefazolin and Flagyl Will be initiated today 05/27/2019   -WBC16.4 >>> 18.8 >> 15.6 >>13.0 today . -Stat  right foot x-ray showed "Prior amputation of the right 2nd toe. No bone destruction to suggest osteomyelitis."   -Patient had blood cultures done on 05/14/2019 and repeated  --Repeat blood culture 05/24/2019  >> no growth to date - patient's tissue culture of the right second toe grew  out rare E. coli, rare Haemophilus parainfluenza, beta-lactamase negative, few Bacteriodes Vulgatus, and Beta-Lactamase positive with E. coli that was pansensitive  Normocytic Anemia/Anemia of Chronic Kidney Disease/ESRD -Monitoring H&H -Hemoglobin 7.7>> 6.9 -transfusing 2U PRBC with HD today 05/27/2019>>   -Anemia Panel  iron level 16, ferritin levels 2749, folate levels to 21.1, and vitamin B12 levels 1936   Type 2 Diabetes -Checking blood sugar QA CHS, SSI -A1c was 8.0 on 7/26. - -Continue monitor CBGs carefully  ESRD  - on HD MWF Elevated Anion Gap of 18 -Nephrology Dr. Tessa Lerner following -On hemodialysis 05/27/2019  Hyperphosphatemia / Hypermagnesemia / Hyponatremia/Hypochloremia -Monitoring anticipating improvement with hemodialysis -Improving  -Continue sevelamer carbonate 2.4 g p.o. 3 times daily with meals    Abnormal LFT's, -Monitoring LFTs: AST/ALT/alk phos continue to improve -Hepatitis panel A/B/C negative  -RUQ U/S and Acute Hepatitis :   IMPRESSION: 1. Multiple gallstones. Gallbladder wall thickness 2.6 mm. Negative Murphy sign. No biliary distention. 2. Trace ascites.  -Continue to Monitor and Trend Hepatic Fxn Panel   Hypertension -Not on any home medications. -Continue to Monitor blood pressure -C/w Hydralazine 5 mg q4hprn for SBP>160 -BP was 145/81 this AM     Obesity -Estimated body mass index is 30.59 kg/m as calculated from the following:   Height as of 05/13/19: _0  (1.778 m).   Weight as of this encounter: 96.7 kg. -Weight Loss and Dietary Counseling given  GERD/Reflux/Indigestion -C/w Calcium Carbonate 500 mg po q6hprn Indigestion and Heartburn -Given Simethicone 80 mg po x1    DVT prophylaxis: SCDs Code Status: FULL CODE Family Communication: Discussed with wife at bedside Disposition Plan: Pending further workup and evaluation by Podiatry   Consultants:   Podiatry  Procedures:   Done by Dr. March Rummage on 8/ 4/20        1)  Incision and drainage of deep space abscess of the right foot             2) Right second toe amputation at the metatarsophalangeal joint  -05/14/2019 S/P second toe partial amputation -05/23/2019 urgent I&D, second toe amputation, debridement -05/25/2019 further debridement, irrigation of her right foot woun   RUQ U/S for  Hepatitis :   IMPRESSION: 1. Multiple gallstones. Gallbladder wall thickness 2.6 mm. Negative Murphy sign. No biliary distention. 2. Trace as  Antimicrobials:  -IV Vancomycin, Ceftriaxone, and clindamycin based on patient's allergy profile  Will be DC'd today 05/27/2019/  -05/27/2019 ABX changed to IV cefazolin and Flagyl  Blood/wound cultures: 05/25/2019 wound culture >> growing Klebsiella pneumonia, E. Coli  patient's tissue culture of the right second toe grew out rare E. coli, rare Haemophilus parainfluenza, beta-lactamase negative, few Bacteriodes Vulgatus, and Beta-Lactamase positive with E. coli that was pansensitive    Anti-infectives (From admission, onward)   Start     Dose/Rate Route Frequency Ordered Stop   05/27/19 2000  ceFAZolin (ANCEF) IVPB 2g/100 mL premix     2 g 200 mL/hr over 30 Minutes Intravenous Every M-W-F (2000) 05/27/19 1122     05/27/19 1055  vancomycin variable dose per unstable renal function (pharmacist dosing)  Status:  Discontinued      Does not apply See admin instructions 05/27/19 1055 05/27/19 1119  05/25/19 1747  vancomycin (VANCOCIN) 1-5 GM/200ML-% IVPB    Note to Pharmacy: Rodell Perna   : cabinet override      05/25/19 1747 05/25/19 1752   05/25/19 1200  vancomycin (VANCOCIN) IVPB 1000 mg/200 mL premix  Status:  Discontinued     1,000 mg 200 mL/hr over 60 Minutes Intravenous Every M-W-F (Hemodialysis) 05/24/19 1325 05/27/19 0958   05/25/19 0829  vancomycin (VANCOCIN) powder  Status:  Discontinued       As needed 05/25/19 0830 05/25/19 1006   05/24/19 0013  vancomycin (VANCOCIN) powder  Status:  Discontinued       As needed  05/24/19 0119 05/24/19 0124   05/23/19 1800  metroNIDAZOLE (FLAGYL) IVPB 500 mg     500 mg 100 mL/hr over 60 Minutes Intravenous Every 8 hours 05/23/19 1433     05/23/19 1425  vancomycin variable dose per unstable renal function (pharmacist dosing)  Status:  Discontinued      Does not apply See admin instructions 05/23/19 1425 05/24/19 1325   05/23/19 1415  vancomycin (VANCOCIN) IVPB 750 mg/150 ml premix     750 mg 150 mL/hr over 60 Minutes Intravenous Every Mon (Hemodialysis) 05/23/19 1414 05/23/19 1723   05/22/19 2200  cefTRIAXone (ROCEPHIN) 2 g in sodium chloride 0.9 % 100 mL IVPB  Status:  Discontinued     2 g 200 mL/hr over 30 Minutes Intravenous Every 24 hours 05/22/19 2105 05/22/19 2110   05/22/19 2200  clindamycin (CLEOCIN) IVPB 600 mg  Status:  Discontinued     600 mg 100 mL/hr over 30 Minutes Intravenous Every 8 hours 05/22/19 2105 05/22/19 2110   05/22/19 2200  vancomycin (VANCOCIN) 2,000 mg in sodium chloride 0.9 % 500 mL IVPB  Status:  Discontinued     2,000 mg 250 mL/hr over 120 Minutes Intravenous  Once 05/22/19 2105 05/22/19 2109   05/22/19 2200  vancomycin (VANCOCIN) 2,000 mg in sodium chloride 0.9 % 500 mL IVPB     2,000 mg 250 mL/hr over 120 Minutes Intravenous  Once 05/22/19 2109 05/23/19 0236   05/22/19 2200  clindamycin (CLEOCIN) IVPB 600 mg  Status:  Discontinued     600 mg 100 mL/hr over 30 Minutes Intravenous Every 8 hours 05/22/19 2110 05/23/19 1433   05/22/19 2200  cefTRIAXone (ROCEPHIN) 2 g in sodium chloride 0.9 % 100 mL IVPB  Status:  Discontinued     2 g 200 mL/hr over 30 Minutes Intravenous Every 24 hours 05/22/19 2110 05/27/19 1119       Objective: Vitals:   05/27/19 1020 05/27/19 1030 05/27/19 1100 05/27/19 1123  BP: (!) 162/83 136/66 (!) 160/78 (!) 168/83  Pulse: 94 92 81 (!) 103  Resp:      Temp: 98.5 F (36.9 C)   98.4 F (36.9 C)  TempSrc: Oral   Oral  SpO2:    100%  Weight:    96.7 kg    Intake/Output Summary (Last 24 hours) at  05/27/2019 1152 Last data filed at 05/27/2019 1107 Gross per 24 hour  Intake 1627.92 ml  Output 2304 ml  Net -676.08 ml   Filed Weights   05/25/19 1845 05/27/19 0649 05/27/19 1123  Weight: 97.8 kg 98.3 kg 96.7 kg   BP (!) 168/83 (BP Location: Left Arm)    Pulse (!) 103    Temp 98.4 F (36.9 C) (Oral)    Resp 16    Wt 96.7 kg Comment: stood to scale    SpO2 100%    BMI  30.59 kg/m    Physical Exam  Constitution:  Alert, cooperative, no distress,  Psychiatric: Normal and stable mood and affect, cognition intact,   HEENT: Normocephalic, PERRL, otherwise with in Normal limits  Chest:Chest symmetric Cardio vascular:  S1/S2, RRR, No murmure, No Rubs or Gallops  pulmonary: Clear to auscultation bilaterally, respirations unlabored, negative wheezes / crackles Abdomen: Soft, non-tender, non-distended, bowel sounds,no masses, no organomegaly Muscular skeletal: Limited exam - in bed, able to move all 4 extremities, Normal strength,  Neuro: CNII-XII intact. , normal motor and sensation, reflexes intact  Musculoskeletal: No changes, wound dressing still in place right foot. As a right arm AV fistula with a palpable thrill and auscultated bruit Skin: Dry warm to touch, right foot/second toe surgical wound, wrapped    .  Data Reviewed: I have personally reviewed following labs and imaging studies  CBC: Recent Labs  Lab 05/23/19 1139 05/24/19 0722 05/25/19 1606 05/26/19 0434 05/27/19 0500  WBC 16.4* 18.8* 11.3* 15.6* 13.0*  NEUTROABS 14.3* 16.4*  --  12.9* 10.3*  HGB 7.5* 7.9* 8.4* 7.7* 6.9*  HCT 23.3* 25.0* 26.8* 24.6* 22.5*  MCV 87.6 88.7 88.4 89.8 90.7  PLT 386 340 334 361 972   Basic Metabolic Panel: Recent Labs  Lab 05/23/19 1139 05/24/19 0722 05/25/19 1605 05/26/19 0434 05/27/19 0500  NA 134* 135 133* 133* 132*  K 4.7 4.3 4.2 3.7 3.5  CL 94* 92* 93* 91* 92*  CO2 _0 GLUCOSE 144* 131* 132* 141* 134*  BUN 81* 42* 67* 34* 51*  CREATININE 14.52* 9.50* 12.43*  7.87* 11.07*  CALCIUM 8.9 8.9 8.9 8.8* 8.9  MG 2.6* 2.3 2.5*  --   --   PHOS 7.9* 6.6* 7.7*  --   --    GFR: Estimated Creatinine Clearance: 9 mL/min (A) (by C-G formula based on SCr of 11.07 mg/dL (H)). Liver Function Tests: Recent Labs  Lab 05/22/19 2016 05/23/19 1139 05/24/19 0722 05/25/19 1605 05/26/19 0434 05/27/19 0500  AST 34 82* 75*  --  18 13*  ALT 39 77* 92*  --  51* 36  ALKPHOS 192* 248* 330*  --  283* 236*  BILITOT 0.7 1.1 0.7  --  0.8 0.9  PROT 7.6 6.6 6.4*  --  6.6 6.5  ALBUMIN 2.6* 2.2* 2.1* 2.1* 2.0* 2.1*  CBG: Recent Labs  Lab 05/24/19 0117 05/25/19 1021 05/26/19 0003  GLUCAP 154* 91 129*    No results for input(s): VITAMINB12, FOLATE, FERRITIN, TIBC, IRON, RETICCTPCT in the last 72 hours. Sepsis Labs: Recent Labs  Lab 05/22/19 2046  LATICACIDVEN 1.0    Recent Results (from the past 240 hour(s))  SARS Coronavirus 2 [facility] order, Performed in [place] hospital lab)     Status: None   Collection Time: 05/22/19  8:48 PM  Result Value Ref Range Status   SARS Coronavirus 2 NEGATIVE NEGATIVE Final       Aerobic/Anaerobic Culture (surgical/deep wound)     Status: None (Preliminary result)   Collection Time: 05/24/19 12:26 AM   Specimen: Wound; Abscess  Result Value Ref Range Status   Specimen Description WOUND RIGHT FOOT  Final   Special Requests ID A  Final   Gram Stain NO WBC SEEN RARE GRAM NEGATIVE RODS   Final   Culture   Final    FEW ESCHERICHIA COLI FEW KLEBSIELLA PNEUMONIAE HOLDING FOR POSSIBLE ANAEROBE Performed at [facility], [place] [address] [place], [place] [address]    Report Status  PENDING  Incomplete   Organism ID, Bacteria ESCHERICHIA COLI  Final   Organism ID, Bacteria KLEBSIELLA PNEUMONIAE  Final      Susceptibility   Escherichia coli - MIC*    AMPICILLIN 16 INTERMEDIATE Intermediate     CEFAZOLIN <=4 SENSITIVE Sensitive     CEFEPIME <=1 SENSITIVE Sensitive     CEFTAZIDIME <=1 SENSITIVE Sensitive      CEFTRIAXONE <=1 SENSITIVE Sensitive     CIPROFLOXACIN <=0.25 SENSITIVE Sensitive     GENTAMICIN <=1 SENSITIVE Sensitive     IMIPENEM <=0.25 SENSITIVE Sensitive     TRIMETH/SULFA <=20 SENSITIVE Sensitive     AMPICILLIN/SULBACTAM 4 SENSITIVE Sensitive     PIP/TAZO <=4 SENSITIVE Sensitive     Extended ESBL NEGATIVE Sensitive     * FEW ESCHERICHIA COLI   Klebsiella pneumoniae - MIC*    AMPICILLIN >=32 RESISTANT Resistant     CEFAZOLIN <=4 SENSITIVE Sensitive     CEFEPIME <=1 SENSITIVE Sensitive     CEFTAZIDIME <=1 SENSITIVE Sensitive     CEFTRIAXONE <=1 SENSITIVE Sensitive     CIPROFLOXACIN <=0.25 SENSITIVE Sensitive     GENTAMICIN <=1 SENSITIVE Sensitive     IMIPENEM <=0.25 SENSITIVE Sensitive     TRIMETH/SULFA <=20 SENSITIVE Sensitive     AMPICILLIN/SULBACTAM 4 SENSITIVE Sensitive     PIP/TAZO 8 SENSITIVE Sensitive     Extended ESBL NEGATIVE Sensitive     * FEW KLEBSIELLA PNEUMONIAE  Culture, blood (Routine X 2) w Reflex to ID Panel     Status: None (Preliminary result)   Collection Time: 05/24/19  7:25 AM   Specimen: BLOOD LEFT HAND  Result Value Ref Range Status   Specimen Description BLOOD LEFT HAND  Final   Special Requests   Final    BOTTLES DRAWN AEROBIC ONLY Blood Culture results may not be optimal due to an inadequate volume of blood received in culture bottles   Culture   Final    NO GROWTH 3 DAYS Performed at [facility], [address], [place], [place] [address]    Report Status PENDING  Incomplete  Culture, blood (Routine X 2) w Reflex to ID Panel     Status: None (Preliminary result)   Collection Time: 05/24/19  7:29 AM   Specimen: BLOOD LEFT HAND  Result Value Ref Range Status   Specimen Description BLOOD LEFT HAND  Final   Special Requests   Final    BOTTLES DRAWN AEROBIC ONLY Blood Culture results may not be optimal due to an inadequate volume of blood received in culture bottles   Culture   Final    NO GROWTH 3 DAYS Performed at [place], [place] [address] [place], [place] [address]    Report Status PENDING  Incomplete  Aerobic/Anaerobic Culture (surgical/deep wound)     Status: None (Preliminary result)   Collection Time: 05/25/19  9:25 AM   Specimen: Wound  Result Value Ref Range Status   Specimen Description WOUND RIGHT FOOT  Final   Special Requests NONE  Final   Gram Stain   Final    NO WBC SEEN NO ORGANISMS SEEN Performed at [facility], [address] [place],  [address]    Culture RARE ESCHERICHIA COLI  Final   Report Status PENDING  Incomplete   Organism ID, Bacteria ESCHERICHIA COLI  Final      Susceptibility   Escherichia coli - MIC*    AMPICILLIN 16 INTERMEDIATE Intermediate     CEFAZOLIN <=  4 SENSITIVE Sensitive     CEFEPIME <=1 SENSITIVE Sensitive     CEFTAZIDIME <=1 SENSITIVE Sensitive     CEFTRIAXONE <=1 SENSITIVE Sensitive     CIPROFLOXACIN <=0.25 SENSITIVE Sensitive     GENTAMICIN <=1 SENSITIVE Sensitive     IMIPENEM <=0.25 SENSITIVE Sensitive     TRIMETH/SULFA <=20 SENSITIVE Sensitive     AMPICILLIN/SULBACTAM 4 SENSITIVE Sensitive     PIP/TAZO <=4 SENSITIVE Sensitive     Extended ESBL NEGATIVE Sensitive     * RARE ESCHERICHIA COLI    Radiology Studies: No results found. Scheduled Meds:  sodium chloride   Intravenous Once   Chlorhexidine Gluconate Cloth  6 each Topical Q0600   insulin aspart  0-9 Units Subcutaneous Q4H   lactobacillus acidophilus  2 tablet Oral TID   sevelamer carbonate  2.4 g Oral TID WC   Continuous Infusions:   ceFAZolin (ANCEF) IV     metronidazole 500 mg (05/27/19 0155)    LOS: 5 days   Deatra Shayne, MD  [facility] PAGER is on AMION  If 7PM-7AM, please contact night-coverage [email or web address] Password [email or web address] 05/27/2019, 11:52 AM

## 2019-05-28 ENCOUNTER — Encounter (HOSPITAL_COMMUNITY): Payer: Self-pay | Admitting: Podiatry

## 2019-05-28 LAB — BPAM RBC
Blood Product Expiration Date: 202008142359
Blood Product Expiration Date: 202008312359
ISSUE DATE / TIME: 202008070927
ISSUE DATE / TIME: 202008070927
Unit Type and Rh: 5100
Unit Type and Rh: 5100

## 2019-05-28 LAB — TYPE AND SCREEN
ABO/RH(D): O POS
Antibody Screen: NEGATIVE
Unit division: 0
Unit division: 0

## 2019-05-28 MED ORDER — MORPHINE SULFATE (PF) 2 MG/ML IV SOLN
1.0000 mg | Freq: Once | INTRAVENOUS | Status: AC
  Administered 2019-05-28: 1 mg via INTRAVENOUS
  Filled 2019-05-28: qty 1

## 2019-05-28 NOTE — Progress Notes (Addendum)
[facility] Progress Note   Subjective:   Patient seen in room. Underwent amputation of R third toe and debridement of R foot yesterday. Says he is still adjusting- was not mentally prepared for this. Ongoing gas pain. Denies SOB, dyspnea, cough, CP, N/V/D.  Objective Vitals:   05/27/19 2011 05/28/19 0014 05/28/19 0347 05/28/19 0903  BP: (!) 158/88 (!) 160/89 (!) 154/80 (!) 151/85  Pulse: 88 91 95 91  Resp: 14 14 14    Temp: (!) 97.5 F (36.4 C) 98.4 F (36.9 C) 98.6 F (37 C) 98.8 F (37.1 C)  TempSrc: Oral Oral Oral Oral  SpO2: 100% 96% 98% 98%  Weight:      Height:       Physical Exam General: Well developed, alert and in NAD Heart: RRR, no murmurs, rubs or gallops Lungs: CTA bilaterally without wheezing, rhonchi or rales Abdomen: Soft, non-tender, non-distended Extremities: No edema. R foot wrapped Dialysis Access:  RUE AVF + bruit  Additional Objective Labs: Basic Metabolic Panel: Recent Labs  Lab 05/23/19 1139 05/24/19 0722 05/25/19 1605 05/26/19 0434 05/27/19 0500  NA 134* 135 133* 133* 132*  K 4.7 4.3 4.2 3.7 3.5  CL 94* 92* 93* 91* 92*  CO2 23 25 23 25 22   GLUCOSE 144* 131* 132* 141* 134*  BUN 81* 42* 67* 34* 51*  CREATININE 14.52* 9.50* 12.43* 7.87* 11.07*  CALCIUM 8.9 8.9 8.9 8.8* 8.9  PHOS 7.9* 6.6* 7.7*  --   --    Liver Function Tests: Recent Labs  Lab 05/24/19 0722 05/25/19 1605 05/26/19 0434 05/27/19 0500  AST 75*  --  18 13*  ALT 92*  --  51* 36  ALKPHOS 330*  --  283* 236*  BILITOT 0.7  --  0.8 0.9  PROT 6.4*  --  6.6 6.5  ALBUMIN 2.1* 2.1* 2.0* 2.1*   No results for input(s): LIPASE, AMYLASE in the last 168 hours. CBC: Recent Labs  Lab 05/23/19 1139 05/24/19 0722 05/25/19 1606 05/26/19 0434 05/27/19 0500  WBC 16.4* 18.8* 11.3* 15.6* 13.0*  NEUTROABS 14.3* 16.4*  --  12.9* 10.3*  HGB 7.5* 7.9* 8.4* 7.7* 6.9*  HCT 23.3* 25.0* 26.8* 24.6* 22.5*  MCV 87.6 88.7 88.4 89.8 90.7  PLT 386 340 334 361 369   Blood  Culture    Component Value Date/Time   SDES WOUND RIGHT FOOT 05/25/2019 0925   SPECREQUEST NONE 05/25/2019 0925   CULT  05/25/2019 0925    RARE ESCHERICHIA COLI RARE KLEBSIELLA PNEUMONIAE    REPTSTATUS PENDING 05/25/2019 [id number]    Cardiac Enzymes: No results for input(s): CKTOTAL, CKMB, CKMBINDEX, TROPONINI in the last 168 hours. CBG: Recent Labs  Lab 05/24/19 0117 05/25/19 1021 05/26/19 0003 05/27/19 1845  GLUCAP 154* 91 129* 114*   Iron Studies: No results for input(s): IRON, TIBC, TRANSFERRIN, FERRITIN in the last 72 hours. @lablastinr3 @ Studies/Results: No results found. Medications: . sodium chloride Stopped (05/28/19 0205)  . sodium chloride 10 mL/hr at 05/27/19 1714  .  ceFAZolin (ANCEF) IV    . metronidazole 500 mg (05/28/19 1024)   . sodium chloride   Intravenous Once  . Chlorhexidine Gluconate Cloth  6 each Topical Q0600  . insulin aspart  0-9 Units Subcutaneous Q4H  . lactobacillus acidophilus  2 tablet Oral TID  . sevelamer carbonate  2.4 g Oral TID WC    Dialysis Orders: MWF at [facility] 4hr, 450/800, EDW 98kg, 2K/2.25Ca, UFP #3, AVF, heparin 2000 bolus - Hectoral 69mcg IV q HD -  Mircera 73mcg IV q 2 weeks (last 7/15)   Assessment/Plan: 1. R foot cellulitis:S/p second toe partial amputation on 7/25, urgent I&D and second toe amputation on 8/3/2020and further debridement8/5.Further debridement and third toe amputated 8/7. Feeling well, denies pain.On IVceftriaxone, metronidazole. Per podiatry. 2. ESRD: HD MWF via AVF. K+ 3.5. Next HD 05/30/2019. Hold heparin.  3. Hypertension/volume:Not eating well in hospital, probably losing wt. BP high, now 1.3kg below EDW. Continue to challenge as tolerated with HD Monday.  4. Anemia:Hgb 12.0on admission-HGB 6.9 on 8/7 and received 2 units of PRBCs with HD - refused labs this AM, says he only wants them to be drawn with dialysis. Explained purpose of labs but says they are going to damage his veins. Continue  ESA.  5. Metabolic bone 99991111, corrected 10.4. Will hold hectorol, follow calcium. Phos 5.5, switched to renvela powder during last admission- continue same.Phos 7.7. Consider increasing renvela dose if phos does not improve.Requests labs be limited to HD when possible. 6. Nutrition:Renal diet with fluid restriction. 7. T2DM: Insulin per primary 8.    Elevated LFTs: LFTsinitiallytrending up now improving. Per primary  Anice Paganini, PA-C 05/28/2019, 10:54 AM  [facility] Pager: [phone]  Pt seen, examined and agree w A/P as above.  Kelly Splinter  MD 05/28/2019, 1:51 PM

## 2019-05-28 NOTE — Progress Notes (Signed)
Patient took his blood sugar 127.  No insulin needed at this time.  C/O #10 pain, not time for vicodin.  [facility] on call notified with orders to give morphine 1mg  x 1.  To follow progress.

## 2019-05-28 NOTE — Plan of Care (Signed)
  Problem: Skin Integrity: Goal: Skin integrity will improve Outcome: Progressing   Problem: Pain Managment: Goal: General experience of comfort will improve Outcome: Progressing

## 2019-05-28 NOTE — Progress Notes (Signed)
PROGRESS NOTE    [name]  VZC:[id number] DOB: [birth year] DOA: 05/22/2019 PCP: Hayden Rasmussen, MD   Brief Narrative:  [name] is a 53 y.o. male with medical history significant of diet-controlled type 2 diabetes, hypertension, ESRD on HD, obesity, iron deficiency anemia presenting to the hospital as a direct admit by Dr. March Rummage from podiatry.  Status post recent right foot second toe partial amputation for osteomyelitis on 7/25.  He was discharged with a 2-week course of Keflex.  ABIs revealed noncompressible right lower extremity arteries and noncompressible left lower extremity arteries.  Left toe brachial index abnormal.  Patient also underwent one-vessel posterior tibial artery runoff of bilateral foot.  Patient was seen by Dr. March Rummage today and found to have temperature 101.6 F.  Surgical site with delayed healing but without frank purulence.  He had erythema and swelling of the right lower extremity.  Given signs of infection he was sent to the hospital for IV antibiotics and possible surgical debridement. Patient states he has been having low-grade fevers since he left the hospital.  Last night his temperature went up to 101 F range.  He started noticing erythema and swelling extending up from his foot to his ankle.  He is having some discomfort in his foot from wearing an Haematologist.  States he has not been getting Keflex at dialysis as planned.  States 2 days ago Dr. March Rummage switched him to clindamycin and he has been taking this medication.  Denies any shortness of breath, cough, nausea, vomiting, or abdominal pain.  Interim History  Patient's foot is improved per the patient but he is having significant amount of gas due to his lactose intolerance.  Patient was dialyzed today and likely will undergo surgical intervention.  Will defer further imaging studies to podiatry and may consider an MRI of the foot if patient does not improve significantly.  Podiatry evaluated last evening and Dr.  March Rummage felt that the patient had an abscess so he underwent emergent I&D last night with improved swelling and pain in the leg .   Subjective: No acute issues or events overnight, patient's pain is moderately well controlled, denies any bleeding, chest pain, shortness of breath, nausea, vomiting, diarrhea, constipation, headache, fevers, chills.  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)   Gangrene of toe of right foot (HCC)   Ulcer of right foot, with necrosis of bone (HCC)    Right Foot Cellulitis, POA, failure of outpatient therapy -05/14/2019 S/P second toe partial amputation -05/23/2019 urgent I&D, second toe amputation, debridement -05/25/2019 further debridement, irrigation of her right foot wound -Wound cultures pending -Patient does not meet sepsis criteria - reportedly on clindamycin/cephalexin per outpatient documentation -Placed on Vancomycin/ceftriaxone/clindamycin at admission - now discontinued -De-escalated to cefazolin/flagyl per previous documentation -Preliminary wound cultures are growing Klebsiella pneumonia/E. Coli -WBC16.4 >>> 18.8 >> 15.6 >>13.0 -Stat right foot x-ray showed "Prior amputation of the right 2nd toe. No bone destruction to suggest osteomyelitis." -Blood culture 05/24/2019  >> no growth to date  Normocytic Anemia/Anemia of Chronic Kidney Disease/ESRD Likely post-op blood loss anemia concurrently -Monitoring with labs on dialysis days only per patient request to limit sticks -Hemoglobin downtrending - s/p 2u  PRBC 8/7 with dialysis -Anemia Panel  iron level 16, ferritin levels 2749, folate levels to 21.1, and vitamin B12 levels 1936  Type 2 Diabetes, non insulin dependent -Checking blood sugar QA CHS,  SSI -A1c was 8.0 on 7/26. -Continue monitor CBGs carefully  ESRD Dialysis MWF via RUE fistula -HD MWF Elevated Anion Gap of 18 - appears chronically elevated 2/2 BUN -Nephrology Dr. Tessa Lerner following  Hyperphosphatemia / Hypermagnesemia / Hyponatremia/Hypochloremia -Monitoring anticipating improvement with hemodialysis -Improving -Continue sevelamer carbonate 2.4 g p.o. 3 times daily with meals per nephro   Abnormal LFT's -Monitoring LFTs: AST/ALT/alk phos continue to improve -Hepatitis panel A/B/C negative -RUQ U/S and Acute Hepatitis :   IMPRESSION: 1. Multiple gallstones. Gallbladder wall thickness 2.6 mm. Negative Murphy sign. No biliary distention. 2. Trace ascites. -Downtrending - follow up outpatient for further evaluation/treatment as necessary  Hypertension -Not on any home medications. -Questionably 2/2 ESRD and volume status as above given improvement previously with dialysis earlier this week   Obesity -Estimated body mass index is 30.59 kg/m as calculated from the following:   Height as of this encounter: '5\' 10"'  (1.778 m).   Weight as of this encounter: 96.7 kg. -Weight Loss and Dietary Counseling given  GERD/Reflux/Indigestion -C/w Calcium Carbonate 500 mg po q6hprn Indigestion and Heartburn -Previously given simethicone 80 mg po x1  DVT prophylaxis: SCDs Code Status: FULL CODE Disposition Plan: Pending further workup and evaluation by Podiatry; possible need for prolonged IV abx  Consultants:   Podiatry  Procedures:   -05/25/2019 further debridement, irrigation of her right foot wound -05/24/19: 1) Incision and drainage of deep space abscess of the right foot 2) Right second toe amputation at the metatarsophalangeal joint -05/14/2019 S/P second toe partial amputation -05/23/2019 urgent I&D, second toe amputation, debridement   RUQ U/S for  Hepatitis :   IMPRESSION: 1. Multiple gallstones. Gallbladder wall thickness 2.6 mm. Negative Murphy sign. No  biliary distention. 2. Trace as  Microbiology: Recent Results (from the past 240 hour(s))  SARS Coronavirus 2 [facility] order, Performed in [place] hospital lab)     Status: None   Collection Time: 05/22/19  8:48 PM  Result Value Ref Range Status   SARS Coronavirus 2 NEGATIVE NEGATIVE Final    Comment: (NOTE) If result is NEGATIVE SARS-CoV-2 target nucleic acids are NOT DETECTED. The SARS-CoV-2 RNA is generally detectable in upper and lower  respiratory specimens during the acute phase of infection. The lowest  concentration of SARS-CoV-2 viral copies this assay can detect is 250  copies / mL. A negative result does not preclude SARS-CoV-2 infection  and should not be used as the sole basis for treatment or other  patient management decisions.  A negative result may occur with  improper specimen collection / handling, submission of specimen other  than nasopharyngeal swab, presence of viral mutation(s) within the  areas targeted by this assay, and inadequate number of viral copies  (<250 copies / mL). A negative result must be combined with clinical  observations, patient history, and epidemiological information. If result is POSITIVE SARS-CoV-2 target nucleic acids are DETECTED. The SARS-CoV-2 RNA is generally detectable in upper and lower  respiratory specimens dur ing the acute phase of infection.  Positive  results are indicative of active infection with SARS-CoV-2.  Clinical  correlation with patient history and other diagnostic information is  necessary to determine patient infection status.  Positive results do  not rule out bacterial infection or co-infection with other viruses. If result is PRESUMPTIVE POSTIVE  SARS-CoV-2 nucleic acids MAY BE PRESENT.   A presumptive positive result was obtained on the submitted specimen  and confirmed on repeat testing.  While 2019 novel coronavirus  (SARS-CoV-2) nucleic acids may be present in the submitted sample  additional  confirmatory testing may be necessary for epidemiological  and / or clinical management purposes  to differentiate between  SARS-CoV-2 and other Sarbecovirus currently known to infect humans.  If clinically indicated additional testing with an alternate test  methodology [phone]) is advised. The SARS-CoV-2 RNA is generally  detectable in upper and lower respiratory sp ecimens during the acute  phase of infection. The expected result is Negative. Fact Sheet for Patients:  StrictlyIdeas.no Fact Sheet for Healthcare Providers: BankingDealers.co.za This test is not yet approved or cleared by the [place] FDA and has been authorized for detection and/or diagnosis of SARS-CoV-2 by FDA under an Emergency Use Authorization (EUA).  This EUA will remain in effect (meaning this test can be used) for the duration of the COVID-19 declaration under Section 564(b)(1) of the Act, 21 U.S.C. section 360bbb-3(b)(1), unless the authorization is terminated or revoked sooner. Performed at [facility], [place] [address] [place], [place] [address]   Aerobic/Anaerobic Culture (surgical/deep wound)     Status: None (Preliminary result)   Collection Time: 05/24/19 12:26 AM   Specimen: Wound; Abscess  Result Value Ref Range Status   Specimen Description WOUND RIGHT FOOT  Final   Special Requests ID A  Final   Gram Stain NO WBC SEEN RARE GRAM NEGATIVE RODS   Final   Culture   Final    FEW ESCHERICHIA COLI FEW KLEBSIELLA PNEUMONIAE HOLDING FOR POSSIBLE ANAEROBE Performed at [facility], [place] [address], [place], [place] [address]    Report Status PENDING  Incomplete   Organism ID, Bacteria ESCHERICHIA COLI  Final   Organism ID, Bacteria KLEBSIELLA PNEUMONIAE  Final      Susceptibility   Escherichia coli - MIC*    AMPICILLIN 16 INTERMEDIATE Intermediate     CEFAZOLIN <=4 SENSITIVE Sensitive     CEFEPIME <=1 SENSITIVE Sensitive      CEFTAZIDIME <=1 SENSITIVE Sensitive     CEFTRIAXONE <=1 SENSITIVE Sensitive     CIPROFLOXACIN <=0.25 SENSITIVE Sensitive     GENTAMICIN <=1 SENSITIVE Sensitive     IMIPENEM <=0.25 SENSITIVE Sensitive     TRIMETH/SULFA <=20 SENSITIVE Sensitive     AMPICILLIN/SULBACTAM 4 SENSITIVE Sensitive     PIP/TAZO <=4 SENSITIVE Sensitive     Extended ESBL NEGATIVE Sensitive     * FEW ESCHERICHIA COLI   Klebsiella pneumoniae - MIC*    AMPICILLIN >=32 RESISTANT Resistant     CEFAZOLIN <=4 SENSITIVE Sensitive     CEFEPIME <=1 SENSITIVE Sensitive     CEFTAZIDIME <=1 SENSITIVE Sensitive     CEFTRIAXONE <=1 SENSITIVE Sensitive     CIPROFLOXACIN <=0.25 SENSITIVE Sensitive     GENTAMICIN <=1 SENSITIVE Sensitive     IMIPENEM <=0.25 SENSITIVE Sensitive     TRIMETH/SULFA <=20 SENSITIVE Sensitive     AMPICILLIN/SULBACTAM 4 SENSITIVE Sensitive     PIP/TAZO 8 SENSITIVE Sensitive     Extended ESBL NEGATIVE Sensitive     * FEW KLEBSIELLA PNEUMONIAE  Culture, blood (Routine X 2) w Reflex to ID Panel     Status: None (Preliminary result)   Collection Time: 05/24/19  7:25 AM   Specimen: BLOOD LEFT HAND  Result Value Ref Range Status   Specimen Description BLOOD LEFT HAND  Final  Special Requests   Final    BOTTLES DRAWN AEROBIC ONLY Blood Culture results may not be optimal due to an inadequate volume of blood received in culture bottles   Culture   Final    NO GROWTH 4 DAYS Performed at [facility], [place] [address] [place], [place] [address]    Report Status PENDING  Incomplete  Culture, blood (Routine X 2) w Reflex to ID Panel     Status: None (Preliminary result)   Collection Time: 05/24/19  7:29 AM   Specimen: BLOOD LEFT HAND  Result Value Ref Range Status   Specimen Description BLOOD LEFT HAND  Final   Special Requests   Final    BOTTLES DRAWN AEROBIC ONLY Blood Culture results may not be optimal due to an inadequate volume of blood received in culture bottles   Culture   Final    NO GROWTH 4  DAYS Performed at [facility], [place] [address], [place], [place] [address]    Report Status PENDING  Incomplete  Aerobic/Anaerobic Culture (surgical/deep wound)     Status: None (Preliminary result)   Collection Time: 05/25/19  9:25 AM   Specimen: Wound  Result Value Ref Range Status   Specimen Description WOUND RIGHT FOOT  Final   Special Requests NONE  Final   Gram Stain   Final    NO WBC SEEN NO ORGANISMS SEEN Performed at [facility], [address], [place], [place] [address]    Culture   Final    RARE ESCHERICHIA COLI RARE KLEBSIELLA PNEUMONIAE    Report Status PENDING  Incomplete   Organism ID, Bacteria ESCHERICHIA COLI  Final      Susceptibility   Escherichia coli - MIC*    AMPICILLIN 16 INTERMEDIATE Intermediate     CEFAZOLIN <=4 SENSITIVE Sensitive     CEFEPIME <=1 SENSITIVE Sensitive     CEFTAZIDIME <=1 SENSITIVE Sensitive     CEFTRIAXONE <=1 SENSITIVE Sensitive     CIPROFLOXACIN <=0.25 SENSITIVE Sensitive     GENTAMICIN <=1 SENSITIVE Sensitive     IMIPENEM <=0.25 SENSITIVE Sensitive     TRIMETH/SULFA <=20 SENSITIVE Sensitive     AMPICILLIN/SULBACTAM 4 SENSITIVE Sensitive     PIP/TAZO <=4 SENSITIVE Sensitive     Extended ESBL NEGATIVE Sensitive     * RARE ESCHERICHIA COLI    Anti-infectives (From admission, onward)   Start     Dose/Rate Route Frequency Ordered Stop   05/27/19 2000  ceFAZolin (ANCEF) IVPB 2g/100 mL premix     2 g 200 mL/hr over 30 Minutes Intravenous Every M-W-F (2000) 05/27/19 1122     05/27/19 1755  vancomycin (VANCOCIN) powder  Status:  Discontinued       As needed 05/27/19 1756 05/27/19 1843   05/27/19 1055  vancomycin variable dose per unstable renal function (pharmacist dosing)  Status:  Discontinued      Does not apply See admin instructions 05/27/19 1055 05/27/19 1119   05/25/19 1747  vancomycin (VANCOCIN) 1-5 GM/200ML-% IVPB    Note to Pharmacy: Rodell Perna   : cabinet override      05/25/19 1747 05/25/19 1752    05/25/19 1200  vancomycin (VANCOCIN) IVPB 1000 mg/200 mL premix  Status:  Discontinued     1,000 mg 200 mL/hr over 60 Minutes Intravenous Every M-W-F (Hemodialysis) 05/24/19 1325 05/27/19 0958   05/25/19 0829  vancomycin (VANCOCIN) powder  Status:  Discontinued       As needed 05/25/19 0830 05/25/19  1006   05/24/19 0013  vancomycin (VANCOCIN) powder  Status:  Discontinued       As needed 05/24/19 0119 05/24/19 0124   05/23/19 1800  metroNIDAZOLE (FLAGYL) IVPB 500 mg     500 mg 100 mL/hr over 60 Minutes Intravenous Every 8 hours 05/23/19 1433     05/23/19 1425  vancomycin variable dose per unstable renal function (pharmacist dosing)  Status:  Discontinued      Does not apply See admin instructions 05/23/19 1425 05/24/19 1325   05/23/19 1415  vancomycin (VANCOCIN) IVPB 750 mg/150 ml premix     750 mg 150 mL/hr over 60 Minutes Intravenous Every Mon (Hemodialysis) 05/23/19 1414 05/23/19 1723   05/22/19 2200  cefTRIAXone (ROCEPHIN) 2 g in sodium chloride 0.9 % 100 mL IVPB  Status:  Discontinued     2 g 200 mL/hr over 30 Minutes Intravenous Every 24 hours 05/22/19 2105 05/22/19 2110   05/22/19 2200  clindamycin (CLEOCIN) IVPB 600 mg  Status:  Discontinued     600 mg 100 mL/hr over 30 Minutes Intravenous Every 8 hours 05/22/19 2105 05/22/19 2110   05/22/19 2200  vancomycin (VANCOCIN) 2,000 mg in sodium chloride 0.9 % 500 mL IVPB  Status:  Discontinued     2,000 mg 250 mL/hr over 120 Minutes Intravenous  Once 05/22/19 2105 05/22/19 2109   05/22/19 2200  vancomycin (VANCOCIN) 2,000 mg in sodium chloride 0.9 % 500 mL IVPB     2,000 mg 250 mL/hr over 120 Minutes Intravenous  Once 05/22/19 2109 05/23/19 0236   05/22/19 2200  clindamycin (CLEOCIN) IVPB 600 mg  Status:  Discontinued     600 mg 100 mL/hr over 30 Minutes Intravenous Every 8 hours 05/22/19 2110 05/23/19 1433   05/22/19 2200  cefTRIAXone (ROCEPHIN) 2 g in sodium chloride 0.9 % 100 mL IVPB  Status:  Discontinued     2 g 200 mL/hr over  30 Minutes Intravenous Every 24 hours 05/22/19 2110 05/27/19 1119       Objective: Vitals:   05/27/19 2011 05/28/19 0014 05/28/19 0347 05/28/19 0903  BP: (!) 158/88 (!) 160/89 (!) 154/80 (!) 151/85  Pulse: 88 91 95 91  Resp: '14 14 14   ' Temp: (!) 97.5 F (36.4 C) 98.4 F (36.9 C) 98.6 F (37 C) 98.8 F (37.1 C)  TempSrc: Oral Oral Oral Oral  SpO2: 100% 96% 98% 98%  Weight:      Height:        Intake/Output Summary (Last 24 hours) at 05/28/2019 1018 Last data filed at 05/28/2019 0600 Gross per 24 hour  Intake 1022.02 ml  Output 2134 ml  Net -1111.98 ml   Filed Weights   05/27/19 0649 05/27/19 1123 05/27/19 1557  Weight: 98.3 kg 96.7 kg 96.7 kg   BP (!) 151/85 (BP Location: Left Arm)    Pulse 91    Temp 98.8 F (37.1 C) (Oral)    Resp 14    Ht '5\' 10"'  (1.778 m)    Wt 96.7 kg    SpO2 98%    BMI 30.59 kg/m    Physical Exam  Constitution:  Alert, cooperative, no distress,  Psychiatric: Normal and stable mood and affect, cognition intact,   HEENT: Normocephalic, PERRL, otherwise with in Normal limits  Chest:Chest symmetric Cardio vascular:  S1/S2, RRR, No murmure, No Rubs or Gallops  pulmonary: Clear to auscultation bilaterally, respirations unlabored, negative wheezes / crackles Abdomen: Soft, non-tender, non-distended, bowel sounds,no masses, no organomegaly Muscular skeletal: Limited exam -  in bed, able to move all 4 extremities, Normal strength,  Neuro: CNII-XII intact. , normal motor and sensation, reflexes intact  Musculoskeletal: No changes, wound dressing still in place right foot. As a right arm AV fistula with a palpable thrill and auscultated bruit Skin: Dry warm to touch, right foot/second toe surgical wound, wrapped     Data Reviewed: I have personally reviewed following labs and imaging studies  CBC: Recent Labs  Lab 05/23/19 1139 05/24/19 0722 05/25/19 1606 05/26/19 0434 05/27/19 0500  WBC 16.4* 18.8* 11.3* 15.6* 13.0*  NEUTROABS 14.3* 16.4*  --   12.9* 10.3*  HGB 7.5* 7.9* 8.4* 7.7* 6.9*  HCT 23.3* 25.0* 26.8* 24.6* 22.5*  MCV 87.6 88.7 88.4 89.8 90.7  PLT 386 340 334 361 076   Basic Metabolic Panel: Recent Labs  Lab 05/23/19 1139 05/24/19 0722 05/25/19 1605 05/26/19 0434 05/27/19 0500  NA 134* 135 133* 133* 132*  K 4.7 4.3 4.2 3.7 3.5  CL 94* 92* 93* 91* 92*  CO2 '23 25 23 25 22  ' GLUCOSE 144* 131* 132* 141* 134*  BUN 81* 42* 67* 34* 51*  CREATININE 14.52* 9.50* 12.43* 7.87* 11.07*  CALCIUM 8.9 8.9 8.9 8.8* 8.9  MG 2.6* 2.3 2.5*  --   --   PHOS 7.9* 6.6* 7.7*  --   --    GFR: Estimated Creatinine Clearance: 9 mL/min (A) (by C-G formula based on SCr of 11.07 mg/dL (H)). Liver Function Tests: Recent Labs  Lab 05/22/19 2016 05/23/19 1139 05/24/19 0722 05/25/19 1605 05/26/19 0434 05/27/19 0500  AST 34 82* 75*  --  18 13*  ALT 39 77* 92*  --  51* 36  ALKPHOS 192* 248* 330*  --  283* 236*  BILITOT 0.7 1.1 0.7  --  0.8 0.9  PROT 7.6 6.6 6.4*  --  6.6 6.5  ALBUMIN 2.6* 2.2* 2.1* 2.1* 2.0* 2.1*  CBG: Recent Labs  Lab 05/24/19 0117 05/25/19 1021 05/26/19 0003 05/27/19 1845  GLUCAP 154* 91 129* 114*    No results for input(s): VITAMINB12, FOLATE, FERRITIN, TIBC, IRON, RETICCTPCT in the last 72 hours.  Radiology Studies: No results found. Scheduled Meds:  sodium chloride   Intravenous Once   Chlorhexidine Gluconate Cloth  6 each Topical Q0600   insulin aspart  0-9 Units Subcutaneous Q4H   lactobacillus acidophilus  2 tablet Oral TID   sevelamer carbonate  2.4 g Oral TID WC   Continuous Infusions:  sodium chloride Stopped (05/28/19 0205)   sodium chloride 10 mL/hr at 05/27/19 1714    ceFAZolin (ANCEF) IV     metronidazole 100 mL/hr at 05/28/19 0300    LOS: 6 days   Little Ishikawa, DO  [facility] PAGER is on AMION  If 7PM-7AM, please contact night-coverage [email or web address] Password [email or web address] 05/28/2019, 10:18 AM

## 2019-05-28 NOTE — Progress Notes (Signed)
Covering MD Threasa Alpha made aware that patient refused morning labs. Pt prefers Labs to drawn in HD, however HD days are MWF. This was explained to patient and he verbalized understanding. Still refused to let lab draw CMP, CBC with diff. Day shift Nurse made aware.

## 2019-05-28 NOTE — Progress Notes (Signed)
Subjective:  Patient ID: [name], male    DOB: June 17, 1966,  MRN: [id number]  Seen bedside. Better mental spirits today. Pain controlled, did have pain and itching post-procedure. Objective:   Vitals:   05/28/19 0347 05/28/19 0903  BP: (!) 154/80 (!) 151/85  Pulse: 95 91  Resp: 14   Temp: 98.6 F (37 C) 98.8 F (37.1 C)  SpO2: 98% 98%   General AA&O x3. Normal mood and affect.  Vascular Dorsalis pedis pulse palpable.  PT nonpalpable Right foot warm to touch.  Neurologic Epicritic sensation grossly diminished.  Dermatologic Dressing intact. Wound VAC intact, minimal SS drainage in cannister. Discoloration distal tip of right 4th toe but the remainder of the toe warm with capillary refill  Orthopedic: S/p Right 2nd toe amputation.   Results for orders placed or performed during the hospital encounter of 05/22/19 (from the past 24 hour(s))  Glucose, capillary     Status: Abnormal   Collection Time: 05/27/19  6:45 PM  Result Value Ref Range   Glucose-Capillary 114 (H) 70 - 99 mg/dL   Results for orders placed or performed during the hospital encounter of 05/22/19  SARS Coronavirus 2 [facility] order, Performed in [place] hospital lab)     Status: None   Collection Time: 05/22/19  8:48 PM  Result Value Ref Range Status   SARS Coronavirus 2 NEGATIVE NEGATIVE Final    Comment: (NOTE) If result is NEGATIVE SARS-CoV-2 target nucleic acids are NOT DETECTED. The SARS-CoV-2 RNA is generally detectable in upper and lower  respiratory specimens during the acute phase of infection. The lowest  concentration of SARS-CoV-2 viral copies this assay can detect is 250  copies / mL. A negative result does not preclude SARS-CoV-2 infection  and should not be used as the sole basis for treatment or other  patient management decisions.  A negative result may occur with  improper specimen collection / handling, submission of specimen other  than nasopharyngeal swab, presence of viral  mutation(s) within the  areas targeted by this assay, and inadequate number of viral copies  (<250 copies / mL). A negative result must be combined with clinical  observations, patient history, and epidemiological information. If result is POSITIVE SARS-CoV-2 target nucleic acids are DETECTED. The SARS-CoV-2 RNA is generally detectable in upper and lower  respiratory specimens dur ing the acute phase of infection.  Positive  results are indicative of active infection with SARS-CoV-2.  Clinical  correlation with patient history and other diagnostic information is  necessary to determine patient infection status.  Positive results do  not rule out bacterial infection or co-infection with other viruses. If result is PRESUMPTIVE POSTIVE SARS-CoV-2 nucleic acids MAY BE PRESENT.   A presumptive positive result was obtained on the submitted specimen  and confirmed on repeat testing.  While 2019 novel coronavirus  (SARS-CoV-2) nucleic acids may be present in the submitted sample  additional confirmatory testing may be necessary for epidemiological  and / or clinical management purposes  to differentiate between  SARS-CoV-2 and other Sarbecovirus currently known to infect humans.  If clinically indicated additional testing with an alternate test  methodology [phone]) is advised. The SARS-CoV-2 RNA is generally  detectable in upper and lower respiratory sp ecimens during the acute  phase of infection. The expected result is Negative. Fact Sheet for Patients:  StrictlyIdeas.no Fact Sheet for Healthcare Providers: BankingDealers.co.za This test is not yet approved or cleared by the [place] FDA and has been authorized for detection and/or  diagnosis of SARS-CoV-2 by FDA under an Emergency Use Authorization (EUA).  This EUA will remain in effect (meaning this test can be used) for the duration of the COVID-19 declaration under Section 564(b)(1)  of the Act, 21 U.S.C. section 360bbb-3(b)(1), unless the authorization is terminated or revoked sooner. Performed at [facility], [place] [address] [place], [place] [address]   Aerobic/Anaerobic Culture (surgical/deep wound)     Status: None (Preliminary result)   Collection Time: 05/24/19 12:26 AM   Specimen: Wound; Abscess  Result Value Ref Range Status   Specimen Description WOUND RIGHT FOOT  Final   Special Requests ID A  Final   Gram Stain NO WBC SEEN RARE GRAM NEGATIVE RODS   Final   Culture   Final    FEW ESCHERICHIA COLI FEW KLEBSIELLA PNEUMONIAE HOLDING FOR POSSIBLE ANAEROBE Performed at [facility], [place] [address], [place], [place] [address]    Report Status PENDING  Incomplete   Organism ID, Bacteria ESCHERICHIA COLI  Final   Organism ID, Bacteria KLEBSIELLA PNEUMONIAE  Final      Susceptibility   Escherichia coli - MIC*    AMPICILLIN 16 INTERMEDIATE Intermediate     CEFAZOLIN <=4 SENSITIVE Sensitive     CEFEPIME <=1 SENSITIVE Sensitive     CEFTAZIDIME <=1 SENSITIVE Sensitive     CEFTRIAXONE <=1 SENSITIVE Sensitive     CIPROFLOXACIN <=0.25 SENSITIVE Sensitive     GENTAMICIN <=1 SENSITIVE Sensitive     IMIPENEM <=0.25 SENSITIVE Sensitive     TRIMETH/SULFA <=20 SENSITIVE Sensitive     AMPICILLIN/SULBACTAM 4 SENSITIVE Sensitive     PIP/TAZO <=4 SENSITIVE Sensitive     Extended ESBL NEGATIVE Sensitive     * FEW ESCHERICHIA COLI   Klebsiella pneumoniae - MIC*    AMPICILLIN >=32 RESISTANT Resistant     CEFAZOLIN <=4 SENSITIVE Sensitive     CEFEPIME <=1 SENSITIVE Sensitive     CEFTAZIDIME <=1 SENSITIVE Sensitive     CEFTRIAXONE <=1 SENSITIVE Sensitive     CIPROFLOXACIN <=0.25 SENSITIVE Sensitive     GENTAMICIN <=1 SENSITIVE Sensitive     IMIPENEM <=0.25 SENSITIVE Sensitive     TRIMETH/SULFA <=20 SENSITIVE Sensitive     AMPICILLIN/SULBACTAM 4 SENSITIVE Sensitive     PIP/TAZO 8 SENSITIVE Sensitive     Extended ESBL NEGATIVE Sensitive     * FEW  KLEBSIELLA PNEUMONIAE  Culture, blood (Routine X 2) w Reflex to ID Panel     Status: None (Preliminary result)   Collection Time: 05/24/19  7:25 AM   Specimen: BLOOD LEFT HAND  Result Value Ref Range Status   Specimen Description BLOOD LEFT HAND  Final   Special Requests   Final    BOTTLES DRAWN AEROBIC ONLY Blood Culture results may not be optimal due to an inadequate volume of blood received in culture bottles   Culture   Final    NO GROWTH 4 DAYS Performed at [facility] [address], [place], [place] [address]    Report Status PENDING  Incomplete  Culture, blood (Routine X 2) w Reflex to ID Panel     Status: None (Preliminary result)   Collection Time: 05/24/19  7:29 AM   Specimen: BLOOD LEFT HAND  Result Value Ref Range Status   Specimen Description BLOOD LEFT HAND  Final   Special Requests   Final    BOTTLES DRAWN AEROBIC ONLY Blood Culture results may not be optimal due to an inadequate volume of blood received  in culture bottles   Culture   Final    NO GROWTH 4 DAYS Performed at [facility], [place] [address] [place], [place] [address]    Report Status PENDING  Incomplete  Aerobic/Anaerobic Culture (surgical/deep wound)     Status: None (Preliminary result)   Collection Time: 05/25/19  9:25 AM   Specimen: Wound  Result Value Ref Range Status   Specimen Description WOUND RIGHT FOOT  Final   Special Requests NONE  Final   Gram Stain   Final    NO WBC SEEN NO ORGANISMS SEEN Performed at [facility], [address], [place],  [place] [address]    Culture   Final    RARE ESCHERICHIA COLI RARE KLEBSIELLA PNEUMONIAE    Report Status PENDING  Incomplete   Organism ID, Bacteria ESCHERICHIA COLI  Final      Susceptibility   Escherichia coli - MIC*    AMPICILLIN 16 INTERMEDIATE Intermediate     CEFAZOLIN <=4 SENSITIVE Sensitive     CEFEPIME <=1 SENSITIVE Sensitive     CEFTAZIDIME <=1 SENSITIVE Sensitive     CEFTRIAXONE <=1 SENSITIVE Sensitive      CIPROFLOXACIN <=0.25 SENSITIVE Sensitive     GENTAMICIN <=1 SENSITIVE Sensitive     IMIPENEM <=0.25 SENSITIVE Sensitive     TRIMETH/SULFA <=20 SENSITIVE Sensitive     AMPICILLIN/SULBACTAM 4 SENSITIVE Sensitive     PIP/TAZO <=4 SENSITIVE Sensitive     Extended ESBL NEGATIVE Sensitive     * RARE ESCHERICHIA COLI    Assessment & Plan:  Patient was evaluated and treated and all questions answered.  Right Foot Cellulitis, abscess; complex right diabetic foot infection -Discussed again the operative findings from yesterday. The infection does now appear under control. No further purulence was noted. Wound VAC intact. Will plan to remove tomorrow to eval wound site. If healthy and viable will plan for d/c Monday with [facility] for Wound Vac changes. Will need extended abx. No further surgical intervention planned at this time. There is some distal ischemic changes to the 4th toe however this may recover. Will monitor 4th toe closely for signs of further ischemia. -No new labs to trend today. -Will continue to monitor. -PT/OT for WB assistance and with ADLs. He can WB with boot to the RLE.   Plan for wound VAC change tomorrow.  Evelina Bucy, DPM  Accessible via secure chat for questions or concerns.

## 2019-05-28 NOTE — Plan of Care (Signed)
  Problem: Activity: Goal: Risk for activity intolerance will decrease Outcome: Progressing   Problem: Coping: Goal: Level of anxiety will decrease Outcome: Progressing   Problem: Pain Managment: Goal: General experience of comfort will improve Outcome: Progressing   Problem: Safety: Goal: Ability to remain free from injury will improve Outcome: Progressing   

## 2019-05-29 ENCOUNTER — Encounter (HOSPITAL_COMMUNITY): Payer: Self-pay | Admitting: *Deleted

## 2019-05-29 LAB — CULTURE, BLOOD (ROUTINE X 2)
Culture: NO GROWTH
Culture: NO GROWTH

## 2019-05-29 LAB — AEROBIC/ANAEROBIC CULTURE W GRAM STAIN (SURGICAL/DEEP WOUND): Gram Stain: NONE SEEN

## 2019-05-29 MED ORDER — CHLORHEXIDINE GLUCONATE CLOTH 2 % EX PADS
6.0000 | MEDICATED_PAD | Freq: Every day | CUTANEOUS | Status: DC
  Administered 2019-05-31: 6 via TOPICAL

## 2019-05-29 MED ORDER — BACID PO TABS
2.0000 | ORAL_TABLET | Freq: Three times a day (TID) | ORAL | Status: DC
  Administered 2019-05-29 – 2019-05-31 (×3): 2 via ORAL
  Filled 2019-05-29 (×7): qty 2

## 2019-05-29 NOTE — Progress Notes (Signed)
PROGRESS NOTE    [name]  MVH:[id number] DOB: 11-05-1965 DOA: 05/22/2019 PCP: Hayden Rasmussen, MD   Brief Narrative:  [name] is a 53 y.o. male with medical history significant of diet-controlled type 2 diabetes, hypertension, ESRD on HD, obesity, iron deficiency anemia presenting to the hospital as a direct admit by Dr. March Rummage from podiatry.  Status post recent right foot second toe partial amputation for osteomyelitis on 7/25.  He was discharged with a 2-week course of Keflex.  ABIs revealed noncompressible right lower extremity arteries and noncompressible left lower extremity arteries.  Left toe brachial index abnormal.  Patient also underwent one-vessel posterior tibial artery runoff of bilateral foot.  Patient was seen by Dr. March Rummage today and found to have temperature 101.6 F.  Surgical site with delayed healing but without frank purulence.  He had erythema and swelling of the right lower extremity.  Given signs of infection he was sent to the hospital for IV antibiotics and possible surgical debridement. Patient states he has been having low-grade fevers since he left the hospital.  Last night his temperature went up to 101 F range.  He started noticing erythema and swelling extending up from his foot to his ankle.  He is having some discomfort in his foot from wearing an Haematologist.  States he has not been getting Keflex at dialysis as planned.  States 2 days ago Dr. March Rummage switched him to clindamycin and he has been taking this medication.  Denies any shortness of breath, cough, nausea, vomiting, or abdominal pain.  Interim History  Patient's foot is improved per the patient but he is having significant amount of gas due to his lactose intolerance.  Patient was dialyzed today and likely will undergo surgical intervention.  Will defer further imaging studies to podiatry and may consider an MRI of the foot if patient does not improve significantly.  Podiatry evaluated and Dr. March Rummage felt  that the patient had an abscess so he underwent emergent I&D 8/3-8/5 with improved swelling and pain in the leg .  Subjective: No acute issues or events overnight, patient's pain is moderately well controlled, denies any bleeding, chest pain, shortness of breath, nausea, vomiting, diarrhea, constipation, headache, fevers, chills.  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)   Gangrene of toe of right foot (HCC)   Ulcer of right foot, with necrosis of bone (HCC)  Right Foot Cellulitis, POA, failure of outpatient therapy -05/14/2019 S/P second toe partial amputation -05/23/2019 urgent I&D, second toe amputation, debridement -05/25/2019 further debridement, irrigation of her right foot wound -Wound cultures pending -Patient does not meet sepsis criteria - reportedly on clindamycin/cephalexin per outpatient documentation prior to admission -Placed on Vancomycin/ceftriaxone/clindamycin at admission - now discontinued -De-escalated to cefazolin/flagyl per previous documentation -Preliminary wound cultures are growing Klebsiella pneumonia/E. Coli -WBC downtrending appropriately -Stat right foot x-ray showed "Prior amputation of the right 2nd toe. No bone destruction to suggest osteomyelitis" -Blood culture 05/24/2019  >> no growth to date -Podiatry to coordinate antibiotics with dialysis per patient. Happy to assist if necessary; likely to require at least 10 to 14 days of abx pending clean margins - if suspicious for osteomyelitis would likely recommend 4-6 weeks  of antibiotics.  Normocytic Anemia/Anemia of Chronic Kidney Disease/ESRD Likely post-op blood loss anemia concurrently -Monitoring with labs on dialysis days only per patient request to limit  sticks given poor vasculature -Hemoglobin downtrending previously - s/p 2u PRBC 8/7 with dialysis -Anemia Panel  iron level 16, ferritin levels 2749, folate levels to 21.1, and vitamin B12 levels 1936  Type 2 Diabetes, non insulin dependent -Checking blood sugar QA CHS, SSI -A1c was 8.0 on 7/26. -Continue monitor CBGs carefully  ESRD Dialysis MWF via RUE fistula -HD MWF Elevated Anion Gap of 18 - appears chronically elevated 2/2 BUN -Nephrology Dr. Tessa Lerner following  Hyperphosphatemia / Hypermagnesemia / Hyponatremia/Hypochloremia -Monitoring anticipating improvement with hemodialysis -Improving -Continue sevelamer carbonate 2.4 g p.o. 3 times daily with meals per nephro   Abnormal LFT's, resolving -AST/ALT/alk phos continue to improve -Hepatitis panel A/B/C negative -RUQ U/S and Acute Hepatitis :   IMPRESSION: 1. Multiple gallstones. Gallbladder wall thickness 2.6 mm. Negative Murphy sign. No biliary distention. 2. Trace ascites. -Downtrending - follow up outpatient for further evaluation/treatment as necessary - patient does indicate occasional post-prandial abdominal discomfort but this is non-specific, RUQ remains benign on exam  Hypertension -Not on any home medications. -Questionably 2/2 ESRD and volume status as above given improvement previously with dialysis earlier this week   Obesity -Estimated body mass index is 30.59 kg/m as calculated from the following:   Height as of this encounter: '5\' 10"'  (1.778 m).   Weight as of this encounter: 96.7 kg. -Weight Loss and Dietary Counseling given  GERD/Reflux/Indigestion -C/w Calcium Carbonate 500 mg po q6hprn Indigestion and Heartburn -Previously given simethicone 80 mg po x1  DVT prophylaxis: SCDs Code Status: FULL CODE Disposition Plan: Pending further workup and evaluation by Podiatry; possible need for prolonged IV abx  Consultants:   Podiatry  Procedures:   -05/25/2019 further debridement, irrigation of her  right foot wound -05/24/19: 1) Incision and drainage of deep space abscess of the right foot 2) Right second toe amputation at the metatarsophalangeal joint -05/23/2019 urgent I&D, second toe amputation, debridement -05/14/2019 S/P second toe partial amputation  RUQ U/S for  Hepatitis :   IMPRESSION: 1. Multiple gallstones. Gallbladder wall thickness 2.6 mm. Negative Murphy sign. No biliary distention.  2. Trace ascites  Microbiology: Recent Results (from the past 240 hour(s))  SARS Coronavirus 2 [facility] order, Performed in [place] hospital lab)     Status: None   Collection Time: 05/22/19  8:48 PM  Result Value Ref Range Status   SARS Coronavirus 2 NEGATIVE NEGATIVE Final    Comment: (NOTE) If result is NEGATIVE SARS-CoV-2 target nucleic acids are NOT DETECTED. The SARS-CoV-2 RNA is generally detectable in upper and lower  respiratory specimens during the acute phase of infection. The lowest  concentration of SARS-CoV-2 viral copies this assay can detect is 250  copies / mL. A negative result does not preclude SARS-CoV-2 infection  and should not be used as the sole basis for treatment or other  patient management decisions.  A negative result may occur with  improper specimen collection / handling, submission of specimen other  than nasopharyngeal swab, presence of viral mutation(s) within the  areas targeted by this assay, and inadequate number of viral copies  (<250 copies / mL). A negative result must be combined with clinical  observations, patient history, and epidemiological information. If result is POSITIVE SARS-CoV-2 target nucleic acids are DETECTED. The SARS-CoV-2 RNA is generally detectable in upper and lower  respiratory specimens dur ing the acute  phase of infection.  Positive  results are indicative of active infection with SARS-CoV-2.  Clinical  correlation with patient history and other diagnostic information is  necessary to determine patient infection  status.  Positive results do  not rule out bacterial infection or co-infection with other viruses. If result is PRESUMPTIVE POSTIVE SARS-CoV-2 nucleic acids MAY BE PRESENT.   A presumptive positive result was obtained on the submitted specimen  and confirmed on repeat testing.  While 2019 novel coronavirus  (SARS-CoV-2) nucleic acids may be present in the submitted sample  additional confirmatory testing may be necessary for epidemiological  and / or clinical management purposes  to differentiate between  SARS-CoV-2 and other Sarbecovirus currently known to infect humans.  If clinically indicated additional testing with an alternate test  methodology [phone]) is advised. The SARS-CoV-2 RNA is generally  detectable in upper and lower respiratory sp ecimens during the acute  phase of infection. The expected result is Negative. Fact Sheet for Patients:  StrictlyIdeas.no Fact Sheet for Healthcare Providers: BankingDealers.co.za This test is not yet approved or cleared by the [place] FDA and has been authorized for detection and/or diagnosis of SARS-CoV-2 by FDA under an Emergency Use Authorization (EUA).  This EUA will remain in effect (meaning this test can be used) for the duration of the COVID-19 declaration under Section 564(b)(1) of the Act, 21 U.S.C. section 360bbb-3(b)(1), unless the authorization is terminated or revoked sooner. Performed at [facility], [place] [address], [place], [name] [address]   Aerobic/Anaerobic Culture (surgical/deep wound)     Status: None (Preliminary result)   Collection Time: 05/24/19 12:26 AM   Specimen: Wound; Abscess  Result Value Ref Range Status   Specimen Description WOUND RIGHT FOOT  Final   Special Requests ID A  Final   Gram Stain NO WBC SEEN RARE GRAM NEGATIVE RODS   Final   Culture   Final    FEW ESCHERICHIA COLI FEW KLEBSIELLA PNEUMONIAE MODERATE BACTEROIDES VULGATUS BETA  LACTAMASE POSITIVE Performed at [facility], [place] [address] [place], [place] [address]    Report Status PENDING  Incomplete   Organism ID, Bacteria ESCHERICHIA COLI  Final   Organism ID, Bacteria KLEBSIELLA PNEUMONIAE  Final      Susceptibility   Escherichia coli - MIC*    AMPICILLIN 16 INTERMEDIATE Intermediate     CEFAZOLIN <=4 SENSITIVE Sensitive     CEFEPIME <=1 SENSITIVE Sensitive     CEFTAZIDIME <=1 SENSITIVE Sensitive     CEFTRIAXONE <=1 SENSITIVE Sensitive     CIPROFLOXACIN <=0.25 SENSITIVE Sensitive     GENTAMICIN <=1 SENSITIVE Sensitive     IMIPENEM <=0.25 SENSITIVE Sensitive     TRIMETH/SULFA <=20 SENSITIVE Sensitive     AMPICILLIN/SULBACTAM 4 SENSITIVE Sensitive     PIP/TAZO <=4 SENSITIVE Sensitive     Extended ESBL NEGATIVE Sensitive     * FEW ESCHERICHIA COLI   Klebsiella pneumoniae - MIC*    AMPICILLIN >=32 RESISTANT Resistant     CEFAZOLIN <=4 SENSITIVE Sensitive     CEFEPIME <=1 SENSITIVE Sensitive     CEFTAZIDIME <=1 SENSITIVE Sensitive     CEFTRIAXONE <=1 SENSITIVE Sensitive     CIPROFLOXACIN <=0.25 SENSITIVE Sensitive     GENTAMICIN <=1 SENSITIVE Sensitive     IMIPENEM <=0.25 SENSITIVE Sensitive     TRIMETH/SULFA <=20 SENSITIVE Sensitive     AMPICILLIN/SULBACTAM 4 SENSITIVE Sensitive     PIP/TAZO 8 SENSITIVE Sensitive     Extended ESBL NEGATIVE Sensitive     *  FEW KLEBSIELLA PNEUMONIAE  Culture, blood (Routine X 2) w Reflex to ID Panel     Status: None (Preliminary result)   Collection Time: 05/24/19  7:25 AM   Specimen: BLOOD LEFT HAND  Result Value Ref Range Status   Specimen Description BLOOD LEFT HAND  Final   Special Requests   Final    BOTTLES DRAWN AEROBIC ONLY Blood Culture results may not be optimal due to an inadequate volume of blood received in culture bottles   Culture   Final    NO GROWTH 4 DAYS Performed at [facility], [place] [address], [place], [place] [address]    Report Status PENDING  Incomplete  Culture, blood  (Routine X 2) w Reflex to ID Panel     Status: None (Preliminary result)   Collection Time: 05/24/19  7:29 AM   Specimen: BLOOD LEFT HAND  Result Value Ref Range Status   Specimen Description BLOOD LEFT HAND  Final   Special Requests   Final    BOTTLES DRAWN AEROBIC ONLY Blood Culture results may not be optimal due to an inadequate volume of blood received in culture bottles   Culture   Final    NO GROWTH 4 DAYS Performed at [facility], [place] [address], [place], [place] [address]    Report Status PENDING  Incomplete  Aerobic/Anaerobic Culture (surgical/deep wound)     Status: None (Preliminary result)   Collection Time: 05/25/19  9:25 AM   Specimen: Wound  Result Value Ref Range Status   Specimen Description WOUND RIGHT FOOT  Final   Special Requests NONE  Final   Gram Stain   Final    NO WBC SEEN NO ORGANISMS SEEN Performed at [facility], [address] [place], [place] [address]    Culture   Final    RARE ESCHERICHIA COLI RARE KLEBSIELLA PNEUMONIAE NO ANAEROBES ISOLATED; CULTURE IN PROGRESS FOR 5 DAYS    Report Status PENDING  Incomplete   Organism ID, Bacteria ESCHERICHIA COLI  Final   Organism ID, Bacteria KLEBSIELLA PNEUMONIAE  Final      Susceptibility   Escherichia coli - MIC*    AMPICILLIN 16 INTERMEDIATE Intermediate     CEFAZOLIN <=4 SENSITIVE Sensitive     CEFEPIME <=1 SENSITIVE Sensitive     CEFTAZIDIME <=1 SENSITIVE Sensitive     CEFTRIAXONE <=1 SENSITIVE Sensitive     CIPROFLOXACIN <=0.25 SENSITIVE Sensitive     GENTAMICIN <=1 SENSITIVE Sensitive     IMIPENEM <=0.25 SENSITIVE Sensitive     TRIMETH/SULFA <=20 SENSITIVE Sensitive     AMPICILLIN/SULBACTAM 4 SENSITIVE Sensitive     PIP/TAZO <=4 SENSITIVE Sensitive     Extended ESBL NEGATIVE Sensitive     * RARE ESCHERICHIA COLI   Klebsiella pneumoniae - MIC*    AMPICILLIN >=32 RESISTANT Resistant     CEFAZOLIN <=4 SENSITIVE Sensitive     CEFEPIME <=1 SENSITIVE Sensitive     CEFTAZIDIME <=1  SENSITIVE Sensitive     CEFTRIAXONE <=1 SENSITIVE Sensitive     CIPROFLOXACIN <=0.25 SENSITIVE Sensitive     GENTAMICIN <=1 SENSITIVE Sensitive     IMIPENEM <=0.25 SENSITIVE Sensitive     TRIMETH/SULFA <=20 SENSITIVE Sensitive     AMPICILLIN/SULBACTAM 4 SENSITIVE Sensitive     PIP/TAZO 8 SENSITIVE Sensitive     Extended ESBL NEGATIVE Sensitive     * RARE KLEBSIELLA PNEUMONIAE    Anti-infectives (From admission, onward)   Start     Dose/Rate Route  Frequency Ordered Stop   05/27/19 2000  ceFAZolin (ANCEF) IVPB 2g/100 mL premix     2 g 200 mL/hr over 30 Minutes Intravenous Every M-W-F (2000) 05/27/19 1122     05/27/19 1755  vancomycin (VANCOCIN) powder  Status:  Discontinued       As needed 05/27/19 1756 05/27/19 1843   05/27/19 1055  vancomycin variable dose per unstable renal function (pharmacist dosing)  Status:  Discontinued      Does not apply See admin instructions 05/27/19 1055 05/27/19 1119   05/25/19 1747  vancomycin (VANCOCIN) 1-5 GM/200ML-% IVPB    Note to Pharmacy: Rodell Perna   : cabinet override      05/25/19 1747 05/25/19 1752   05/25/19 1200  vancomycin (VANCOCIN) IVPB 1000 mg/200 mL premix  Status:  Discontinued     1,000 mg 200 mL/hr over 60 Minutes Intravenous Every M-W-F (Hemodialysis) 05/24/19 1325 05/27/19 0958   05/25/19 0829  vancomycin (VANCOCIN) powder  Status:  Discontinued       As needed 05/25/19 0830 05/25/19 1006   05/24/19 0013  vancomycin (VANCOCIN) powder  Status:  Discontinued       As needed 05/24/19 0119 05/24/19 0124   05/23/19 1800  metroNIDAZOLE (FLAGYL) IVPB 500 mg     500 mg 100 mL/hr over 60 Minutes Intravenous Every 8 hours 05/23/19 1433     05/23/19 1425  vancomycin variable dose per unstable renal function (pharmacist dosing)  Status:  Discontinued      Does not apply See admin instructions 05/23/19 1425 05/24/19 1325   05/23/19 1415  vancomycin (VANCOCIN) IVPB 750 mg/150 ml premix     750 mg 150 mL/hr over 60 Minutes Intravenous  Every Mon (Hemodialysis) 05/23/19 1414 05/23/19 1723   05/22/19 2200  cefTRIAXone (ROCEPHIN) 2 g in sodium chloride 0.9 % 100 mL IVPB  Status:  Discontinued     2 g 200 mL/hr over 30 Minutes Intravenous Every 24 hours 05/22/19 2105 05/22/19 2110   05/22/19 2200  clindamycin (CLEOCIN) IVPB 600 mg  Status:  Discontinued     600 mg 100 mL/hr over 30 Minutes Intravenous Every 8 hours 05/22/19 2105 05/22/19 2110   05/22/19 2200  vancomycin (VANCOCIN) 2,000 mg in sodium chloride 0.9 % 500 mL IVPB  Status:  Discontinued     2,000 mg 250 mL/hr over 120 Minutes Intravenous  Once 05/22/19 2105 05/22/19 2109   05/22/19 2200  vancomycin (VANCOCIN) 2,000 mg in sodium chloride 0.9 % 500 mL IVPB     2,000 mg 250 mL/hr over 120 Minutes Intravenous  Once 05/22/19 2109 05/23/19 0236   05/22/19 2200  clindamycin (CLEOCIN) IVPB 600 mg  Status:  Discontinued     600 mg 100 mL/hr over 30 Minutes Intravenous Every 8 hours 05/22/19 2110 05/23/19 1433   05/22/19 2200  cefTRIAXone (ROCEPHIN) 2 g in sodium chloride 0.9 % 100 mL IVPB  Status:  Discontinued     2 g 200 mL/hr over 30 Minutes Intravenous Every 24 hours 05/22/19 2110 05/27/19 1119       Objective: Vitals:   05/28/19 1813 05/28/19 1819 05/28/19 2046 05/29/19 0445  BP: (!) 206/107 (!) 168/85 (!) 148/81 (!) 151/76  Pulse: 93 90 93 88  Resp:   16 16  Temp: 98.1 F (36.7 C)  97.9 F (36.6 C) 97.9 F (36.6 C)  TempSrc: Oral  Oral Oral  SpO2: 99%  100% 99%  Weight:      Height:  Intake/Output Summary (Last 24 hours) at 05/29/2019 0726 Last data filed at 05/29/2019 0305 Gross per 24 hour  Intake 1077.7 ml  Output -  Net 1077.7 ml   Filed Weights   05/27/19 0649 05/27/19 1123 05/27/19 1557  Weight: 98.3 kg 96.7 kg 96.7 kg   BP (!) 151/76 (BP Location: Right Arm)   Pulse 88   Temp 97.9 F (36.6 C) (Oral)   Resp 16   Ht '5\' 10"'  (1.778 m)   Wt 96.7 kg   SpO2 99%   BMI 30.59 kg/m    Physical Exam  Constitution:  Alert, cooperative,  no distress,  Psychiatric: Normal and stable mood and affect, cognition intact,   HEENT: Normocephalic, PERRL, otherwise with in Normal limits  Chest:Chest symmetric Cardio vascular:  S1/S2, RRR, No murmure, No Rubs or Gallops  pulmonary: Clear to auscultation bilaterally, respirations unlabored, negative wheezes / crackles Abdomen: Soft, non-tender, non-distended, bowel sounds,no masses, no organomegaly, negative murphy's Muscular skeletal: Limited exam - in bed, able to move all 4 extremities, Normal strength,  Neuro: CNII-XII intact. , normal motor and sensation, reflexes intact  Musculoskeletal: No changes, wound vac/dressing still in place right foot. Right upper arm AV fistula with a palpable thrill and auscultated bruit Skin: Dry warm to touch, right foot/second toe surgical wound, wrapped     Data Reviewed: I have personally reviewed following labs and imaging studies  CBC: Recent Labs  Lab 05/23/19 1139 05/24/19 0722 05/25/19 1606 05/26/19 0434 05/27/19 0500  WBC 16.4* 18.8* 11.3* 15.6* 13.0*  NEUTROABS 14.3* 16.4*  --  12.9* 10.3*  HGB 7.5* 7.9* 8.4* 7.7* 6.9*  HCT 23.3* 25.0* 26.8* 24.6* 22.5*  MCV 87.6 88.7 88.4 89.8 90.7  PLT 386 340 334 361 962   Basic Metabolic Panel: Recent Labs  Lab 05/23/19 1139 05/24/19 0722 05/25/19 1605 05/26/19 0434 05/27/19 0500  NA 134* 135 133* 133* 132*  K 4.7 4.3 4.2 3.7 3.5  CL 94* 92* 93* 91* 92*  CO2 '23 25 23 25 22  ' GLUCOSE 144* 131* 132* 141* 134*  BUN 81* 42* 67* 34* 51*  CREATININE 14.52* 9.50* 12.43* 7.87* 11.07*  CALCIUM 8.9 8.9 8.9 8.8* 8.9  MG 2.6* 2.3 2.5*  --   --   PHOS 7.9* 6.6* 7.7*  --   --    GFR: Estimated Creatinine Clearance: 9 mL/min (A) (by C-G formula based on SCr of 11.07 mg/dL (H)). Liver Function Tests: Recent Labs  Lab 05/22/19 2016 05/23/19 1139 05/24/19 0722 05/25/19 1605 05/26/19 0434 05/27/19 0500  AST 34 82* 75*  --  18 13*  ALT 39 77* 92*  --  51* 36  ALKPHOS 192* 248* 330*  --   283* 236*  BILITOT 0.7 1.1 0.7  --  0.8 0.9  PROT 7.6 6.6 6.4*  --  6.6 6.5  ALBUMIN 2.6* 2.2* 2.1* 2.1* 2.0* 2.1*  CBG: Recent Labs  Lab 05/24/19 0117 05/25/19 1021 05/26/19 0003 05/27/19 1845  GLUCAP 154* 91 129* 114*    No results for input(s): VITAMINB12, FOLATE, FERRITIN, TIBC, IRON, RETICCTPCT in the last 72 hours.  Radiology Studies: No results found. Scheduled Meds: . sodium chloride   Intravenous Once  . Chlorhexidine Gluconate Cloth  6 each Topical Q0600  . insulin aspart  0-9 Units Subcutaneous Q4H  . lactobacillus acidophilus  2 tablet Oral TID  . sevelamer carbonate  2.4 g Oral TID WC   Continuous Infusions: . sodium chloride Stopped (05/29/19 0305)  . sodium chloride 10  mL/hr at 05/27/19 1714  .  ceFAZolin (ANCEF) IV    . metronidazole 500 mg (05/29/19 0305)    LOS: 7 days   Little Ishikawa, DO  [facility] PAGER is on [place]  If 7PM-7AM, please contact night-coverage [email or web address] Password [email or web address] 05/29/2019, 7:26 AM

## 2019-05-29 NOTE — Progress Notes (Addendum)
[facility] Progress Note   Subjective:   Patient is seen in room. No new concerns. Denies SOB, dyspnea, cough, CP, palpitations, abdominal pain, N/V/D. Afebrile.   Objective Vitals:   05/28/19 1819 05/28/19 2046 05/29/19 0445 05/29/19 1107  BP: (!) 168/85 (!) 148/81 (!) 151/76 (!) 158/95  Pulse: 90 93 88 89  Resp:  16 16   Temp:  97.9 F (36.6 C) 97.9 F (36.6 C) 98.4 F (36.9 C)  TempSrc:  Oral Oral Oral  SpO2:  100% 99% 100%  Weight:      Height:       Physical Exam General: Well developed, alert and in NAD Heart: RRR, no murmurs, rubs or gallops Lungs: CTA bilaterally without wheezing, rhonchi or rales Abdomen: Soft, non-tender, non-distended Extremities: No edema. R foot wrapped Dialysis Access:  RUE AVF + bruit   Additional Objective Labs: Basic Metabolic Panel: Recent Labs  Lab 05/23/19 1139 05/24/19 0722 05/25/19 1605 05/26/19 0434 05/27/19 0500  NA 134* 135 133* 133* 132*  K 4.7 4.3 4.2 3.7 3.5  CL 94* 92* 93* 91* 92*  CO2 23 25 23 25 22   GLUCOSE 144* 131* 132* 141* 134*  BUN 81* 42* 67* 34* 51*  CREATININE 14.52* 9.50* 12.43* 7.87* 11.07*  CALCIUM 8.9 8.9 8.9 8.8* 8.9  PHOS 7.9* 6.6* 7.7*  --   --    Liver Function Tests: Recent Labs  Lab 05/24/19 0722 05/25/19 1605 05/26/19 0434 05/27/19 0500  AST 75*  --  18 13*  ALT 92*  --  51* 36  ALKPHOS 330*  --  283* 236*  BILITOT 0.7  --  0.8 0.9  PROT 6.4*  --  6.6 6.5  ALBUMIN 2.1* 2.1* 2.0* 2.1*   No results for input(s): LIPASE, AMYLASE in the last 168 hours. CBC: Recent Labs  Lab 05/23/19 1139 05/24/19 0722 05/25/19 1606 05/26/19 0434 05/27/19 0500  WBC 16.4* 18.8* 11.3* 15.6* 13.0*  NEUTROABS 14.3* 16.4*  --  12.9* 10.3*  HGB 7.5* 7.9* 8.4* 7.7* 6.9*  HCT 23.3* 25.0* 26.8* 24.6* 22.5*  MCV 87.6 88.7 88.4 89.8 90.7  PLT 386 340 334 361 369   Blood Culture    Component Value Date/Time   SDES WOUND RIGHT FOOT 05/25/2019 0925   SPECREQUEST NONE 05/25/2019 0925   CULT  05/25/2019 0925    RARE ESCHERICHIA COLI RARE KLEBSIELLA PNEUMONIAE NO ANAEROBES ISOLATED; CULTURE IN PROGRESS FOR 5 DAYS    REPTSTATUS PENDING 05/25/2019 [id number]    Cardiac Enzymes: No results for input(s): CKTOTAL, CKMB, CKMBINDEX, TROPONINI in the last 168 hours. CBG: Recent Labs  Lab 05/24/19 0117 05/25/19 1021 05/26/19 0003 05/27/19 1845  GLUCAP 154* 91 129* 114*   Iron Studies: No results for input(s): IRON, TIBC, TRANSFERRIN, FERRITIN in the last 72 hours. @lablastinr3 @ Studies/Results: No results found. Medications: . sodium chloride Stopped (05/29/19 0305)  . sodium chloride 10 mL/hr at 05/27/19 1714  .  ceFAZolin (ANCEF) IV    . metronidazole 500 mg (05/29/19 1108)   . sodium chloride   Intravenous Once  . Chlorhexidine Gluconate Cloth  6 each Topical Q0600  . insulin aspart  0-9 Units Subcutaneous Q4H  . lactobacillus acidophilus  2 tablet Oral TID  . sevelamer carbonate  2.4 g Oral TID WC    Dialysis Orders: MWF at [facility] 4hr, 450/800, EDW 98kg, 2K/2.25Ca, UFP #3, AVF, heparin 2000 bolus - Hectoral 67mcg IV q HD - Mircera 66mcg IV q 2 weeks (last 7/15)  Assessment/Plan: 1. R  foot cellulitis:S/p second toe partial amputation on 7/25, urgent I&D and second toe amputation on 8/3/2020and further debridement8/5.Further debridement and third toe amputated 8/7.Feeling well, denies pain.On IVcefazolin and  metronidazole. Per podiatry. 2. ESRD:HD MWF via AVF. K+ 3.5. Next HD 05/30/2019. Hold heparin.No labs since 8/7- patient only allowing labs to be drawn on HD. 3. Hypertension/volume:Not eating well in hospital, probably losing wt. BP high, now 1.3kg below EDW. Continue to challenge as tolerated with HD Monday.  4. Anemia:Hgb 12.0on admission-HGB 6.9 on 8/7 and received 2 units of PRBCs with HD on 8/7- refused labs this AM, says he only wants them to be drawn with dialysis.  Continue ESA. 5. Metabolic bone [id number], corrected 10.4.  Will hold hectorol, follow calcium. Phos 5.5, switched to renvela powder during last admission- continue same.Phos 7.7. Consider increasing renvela dose if phos does not improve.Requests labs be limited to HD when possible. 6. Nutrition:Renal diet with fluid restriction. 7. T2DM: Insulin per primary 8. Elevated LFTs: LFTsinitiallytrending upnow improving. Per primary    Anice Paganini, PA-C 05/29/2019, 12:54 PM  [facility] Pager: [phone]  Pt seen, examined and agree w A/P as above.  Kelly Splinter  MD 05/29/2019, 3:39 PM

## 2019-05-29 NOTE — Procedures (Signed)
Wound VAC applied to the right foot.  Procedure: Wound VAC Application Location: Right plantar foot Wound Measurement: 8 cm x 5.5 cm x 3 cm  Technique: White foam to exposed bone, Black foam to wound base, followed by adherent dressing. Set to 125 mmHg with good seal noted. Disposition: Patient tolerated procedure well.

## 2019-05-29 NOTE — Evaluation (Signed)
Physical Therapy Evaluation Patient Details Name: [name] MRN: [id number] DOB: Apr 28, 1966 Today's Date: 05/29/2019   History of Present Illness  NICHLOUS HORNUNG is a 53 y.o. male with medical history significant of diet-controlled type 2 diabetes, hypertension, ESRD on HD MWF, obesity, iron deficiency anemia presenting to the hospital as a direct admit by Dr. March Rummage from podiatry.  Status post recent right foot second toe partial amputation for osteomyelitis on 7/25.  He was discharged with a 2-week course of Keflex; Admitted 8/2 with fever; 8/7 3rd toe amputation;   Clinical Impression   Patient is s/p above surgery resulting in functional limitations due to the deficits listed below (see PT Problem List). Independent prior to admission; Presents with modestly decr functional mobility, but Mr. Ketchem clearly has thought ahead and problem-solved re: managing mobility and ADLs; Overall moving well; will likely meet PT goals within the next 1-2 sessions;  Patient will benefit from skilled PT to increase their independence and safety with mobility to allow discharge to the venue listed below.       Follow Up Recommendations No PT follow up;Supervision - Intermittent  Eventual Oupt PT; The potential need for Outpatient PT can be addressed at Podiatry follow-up appointments.     Equipment Recommendations  Rolling walker with 5" wheels;3in1 (PT)    Recommendations for Other Services       Precautions / Restrictions Precautions Precautions: Fall Restrictions Weight Bearing Restrictions: Yes RLE Weight Bearing: Weight bearing as tolerated Other Position/Activity Restrictions: CAM boot      Mobility  Bed Mobility Overal bed mobility: Modified Independent                Transfers Overall transfer level: Needs assistance Equipment used: None Transfers: Sit to/from Stand Sit to Stand: Supervision         General transfer comment: supervision for safety, and management of  lines  Ambulation/Gait Ambulation/Gait assistance: Supervision Gait Distance (Feet): 150 Feet Assistive device: Rolling walker (2 wheeled);None Gait Pattern/deviations: Step-through pattern     General Gait Details: Tends to keep R foot externally rotated; Walked with and without the RW; RW seemed helpful for smoothness of gait and gave a bit more support to [place] R foot if it got painful  Stairs Stairs: Yes Stairs assistance: Supervision(for lines) Stair Management: One rail Left;Alternating pattern;Forwards(Step-to pattern descending) Number of Stairs: 5(x2)    Wheelchair Mobility    Modified Rankin (Stroke Patients Only)       Balance Overall balance assessment: Mild deficits observed, not formally tested                                           Pertinent Vitals/Pain Pain Assessment: Faces Faces Pain Scale: Hurts a little bit Pain Location: Right foot Pain Descriptors / Indicators: Constant;Discomfort Pain Intervention(s): Monitored during session;Premedicated before session    [place] expects to be discharged to:: Private residence Living Arrangements: Spouse/significant other Available Help at Discharge: Family;Available 24 hours/day Type of Home: House Home Access: Stairs to enter Entrance Stairs-Rails: None Entrance Stairs-Number of Steps: 2-3 Home Layout: Two level Home Equipment: None      Prior Function Level of Independence: Independent               Hand Dominance        Extremity/Trunk Assessment   Upper Extremity Assessment Upper Extremity Assessment: Defer to OT  evaluation;Overall [facility] for tasks assessed    Lower Extremity Assessment Lower Extremity Assessment: RLE deficits/detail RLE Deficits / Details: 8/7 3rd toe amputation; R foot dressed and Ace Wrapped; gave cues for helping with Cam boot fit; reiterated teh boot is for protection    Cervical / Trunk Assessment Cervical / Trunk  Assessment: Normal  Communication   Communication: No difficulties  Cognition Arousal/Alertness: Awake/alert Behavior During Therapy: WFL for tasks assessed/performed Overall Cognitive Status: Within Functional Limits for tasks assessed                                        General Comments General comments (skin integrity, edema, etc.): Daughter present    Exercises     Assessment/Plan    PT Assessment Patient needs continued PT services  PT Problem List Decreased activity tolerance;Decreased balance;Decreased knowledge of use of DME;Pain;Decreased skin integrity       PT Treatment Interventions DME instruction;Gait training;Stair training;Functional mobility training;Therapeutic activities;Therapeutic exercise;Balance training;Patient/family education    PT Goals (Current goals can be found in the Care Plan section)  Acute Rehab PT Goals Patient Stated Goal: "Go home" PT Goal Formulation: With patient Time For Goal Achievement: 06/12/19(Will likely meet goals and can dc acute PT next session) Potential to Achieve Goals: Good    Frequency Min 3X/week   Barriers to discharge        Co-evaluation PT/OT/SLP Co-Evaluation/Treatment: Yes Reason for Co-Treatment: To address functional/ADL transfers PT goals addressed during session: Mobility/safety with mobility         AM-PAC PT "6 Clicks" Mobility  Outcome Measure Help needed turning from your back to your side while in a flat bed without using bedrails?: None Help needed moving from lying on your back to sitting on the side of a flat bed without using bedrails?: None Help needed moving to and from a bed to a chair (including a wheelchair)?: None Help needed standing up from a chair using your arms (e.g., wheelchair or bedside chair)?: None Help needed to walk in hospital room?: None Help needed climbing 3-5 steps with a railing? : A Little 6 Click Score: 23    End of Session Equipment Utilized  During Treatment: Gait belt Activity Tolerance: Patient tolerated treatment well Patient left: in bed;with call bell/phone within reach Nurse Communication: Mobility status PT Visit Diagnosis: Other abnormalities of gait and mobility (R26.89)    Time: [id number] PT Time Calculation (min) (ACUTE ONLY): 36 min   Charges:   PT Evaluation $PT Eval Low Complexity: [place], PT  Acute Rehabilitation Services Pager [phone] Office [place] 05/29/2019, 1:42 PM

## 2019-05-29 NOTE — Progress Notes (Signed)
Subjective:  Patient ID: [name], male    DOB: [birth year],  MRN: [id number]  Seen bedside. Pain controlled, occasional itching to RLE Objective:   Vitals:   05/29/19 0445 05/29/19 1107  BP: (!) 151/76 (!) 158/95  Pulse: 88 89  Resp: 16   Temp: 97.9 F (36.6 C) 98.4 F (36.9 C)  SpO2: 99% 100%   General AA&O x3. Normal mood and affect.  Vascular Dorsalis pedis pulse palpable.  PT nonpalpable Right foot warm to touch.  Neurologic Epicritic sensation grossly diminished.  Dermatologic Dressing removed, wound bed healing, mostly granular. Exposed 2nd/third metatarsals. Appear viable. Right 4th toe plantar and medial eschar but toe warm and not gangrenous. Slight necrosis medial aspect of the 4th toe. Wound slightly macerated.  Orthopedic: S/p Right 2nd/3 toe amputation.   No results found for this or any previous visit (from the past 24 hour(s)). Results for orders placed or performed during the hospital encounter of 05/22/19  SARS Coronavirus 2 [name] order, Performed in [place] hospital lab)     Status: None   Collection Time: 05/22/19  8:48 PM  Result Value Ref Range Status   SARS Coronavirus 2 NEGATIVE NEGATIVE Final    Comment: (NOTE) If result is NEGATIVE SARS-CoV-2 target nucleic acids are NOT DETECTED. The SARS-CoV-2 RNA is generally detectable in upper and lower  respiratory specimens during the acute phase of infection. The lowest  concentration of SARS-CoV-2 viral copies this assay can detect is 250  copies / mL. A negative result does not preclude SARS-CoV-2 infection  and should not be used as the sole basis for treatment or other  patient management decisions.  A negative result may occur with  improper specimen collection / handling, submission of specimen other  than nasopharyngeal swab, presence of viral mutation(s) within the  areas targeted by this assay, and inadequate number of viral copies  (<250 copies / mL). A negative result must be combined  with clinical  observations, patient history, and epidemiological information. If result is POSITIVE SARS-CoV-2 target nucleic acids are DETECTED. The SARS-CoV-2 RNA is generally detectable in upper and lower  respiratory specimens dur ing the acute phase of infection.  Positive  results are indicative of active infection with SARS-CoV-2.  Clinical  correlation with patient history and other diagnostic information is  necessary to determine patient infection status.  Positive results do  not rule out bacterial infection or co-infection with other viruses. If result is PRESUMPTIVE POSTIVE SARS-CoV-2 nucleic acids MAY BE PRESENT.   A presumptive positive result was obtained on the submitted specimen  and confirmed on repeat testing.  While 2019 novel coronavirus  (SARS-CoV-2) nucleic acids may be present in the submitted sample  additional confirmatory testing may be necessary for epidemiological  and / or clinical management purposes  to differentiate between  SARS-CoV-2 and other Sarbecovirus currently known to infect humans.  If clinically indicated additional testing with an alternate test  methodology [phone]) is advised. The SARS-CoV-2 RNA is generally  detectable in upper and lower respiratory sp ecimens during the acute  phase of infection. The expected result is Negative. Fact Sheet for Patients:  StrictlyIdeas.no Fact Sheet for Healthcare Providers: BankingDealers.co.za This test is not yet approved or cleared by the [place] FDA and has been authorized for detection and/or diagnosis of SARS-CoV-2 by FDA under an Emergency Use Authorization (EUA).  This EUA will remain in effect (meaning this test can be used) for the duration of the COVID-19 declaration under Section  564(b)(1) of the Act, 21 U.S.C. section 360bbb-3(b)(1), unless the authorization is terminated or revoked sooner. Performed at [facility], [place] [address], [place], [place] [address]   Aerobic/Anaerobic Culture (surgical/deep wound)     Status: None   Collection Time: 05/24/19 12:26 AM   Specimen: Wound; Abscess  Result Value Ref Range Status   Specimen Description WOUND RIGHT FOOT  Final   Special Requests ID A  Final   Gram Stain NO WBC SEEN RARE GRAM NEGATIVE RODS   Final   Culture   Final    FEW ESCHERICHIA COLI FEW KLEBSIELLA PNEUMONIAE MODERATE BACTEROIDES VULGATUS BETA LACTAMASE POSITIVE Performed at [facility], [place] [address] [place], [place] [address]    Report Status 05/29/2019 FINAL  Final   Organism ID, Bacteria ESCHERICHIA COLI  Final   Organism ID, Bacteria KLEBSIELLA PNEUMONIAE  Final      Susceptibility   Escherichia coli - MIC*    AMPICILLIN 16 INTERMEDIATE Intermediate     CEFAZOLIN <=4 SENSITIVE Sensitive     CEFEPIME <=1 SENSITIVE Sensitive     CEFTAZIDIME <=1 SENSITIVE Sensitive     CEFTRIAXONE <=1 SENSITIVE Sensitive     CIPROFLOXACIN <=0.25 SENSITIVE Sensitive     GENTAMICIN <=1 SENSITIVE Sensitive     IMIPENEM <=0.25 SENSITIVE Sensitive     TRIMETH/SULFA <=20 SENSITIVE Sensitive     AMPICILLIN/SULBACTAM 4 SENSITIVE Sensitive     PIP/TAZO <=4 SENSITIVE Sensitive     Extended ESBL NEGATIVE Sensitive     * FEW ESCHERICHIA COLI   Klebsiella pneumoniae - MIC*    AMPICILLIN >=32 RESISTANT Resistant     CEFAZOLIN <=4 SENSITIVE Sensitive     CEFEPIME <=1 SENSITIVE Sensitive     CEFTAZIDIME <=1 SENSITIVE Sensitive     CEFTRIAXONE <=1 SENSITIVE Sensitive     CIPROFLOXACIN <=0.25 SENSITIVE Sensitive     GENTAMICIN <=1 SENSITIVE Sensitive     IMIPENEM <=0.25 SENSITIVE Sensitive     TRIMETH/SULFA <=20 SENSITIVE Sensitive     AMPICILLIN/SULBACTAM 4 SENSITIVE Sensitive     PIP/TAZO 8 SENSITIVE Sensitive     Extended ESBL NEGATIVE Sensitive     * FEW KLEBSIELLA PNEUMONIAE  Culture, blood (Routine X 2) w Reflex to ID Panel     Status: None   Collection Time: 05/24/19  7:25 AM   Specimen: BLOOD  LEFT HAND  Result Value Ref Range Status   Specimen Description BLOOD LEFT HAND  Final   Special Requests   Final    BOTTLES DRAWN AEROBIC ONLY Blood Culture results may not be optimal due to an inadequate volume of blood received in culture bottles   Culture   Final    NO GROWTH 5 DAYS Performed at [place] [address] [place], [place] [address]    Report Status 05/29/2019 FINAL  Final  Culture, blood (Routine X 2) w Reflex to ID Panel     Status: None   Collection Time: 05/24/19  7:29 AM   Specimen: BLOOD LEFT HAND  Result Value Ref Range Status   Specimen Description BLOOD LEFT HAND  Final   Special Requests   Final    BOTTLES DRAWN AEROBIC ONLY Blood Culture results may not be optimal due to an inadequate volume of blood received in culture bottles   Culture   Final    NO GROWTH 5 DAYS Performed at [facility], [place] [address], [place], [place] [address]    Report Status 05/29/2019  FINAL  Final  Aerobic/Anaerobic Culture (surgical/deep wound)     Status: None (Preliminary result)   Collection Time: 05/25/19  9:25 AM   Specimen: Wound  Result Value Ref Range Status   Specimen Description WOUND RIGHT FOOT  Final   Special Requests NONE  Final   Gram Stain   Final    NO WBC SEEN NO ORGANISMS SEEN Performed at [facility], [address], [place], [place] [address]    Culture   Final    RARE ESCHERICHIA COLI RARE KLEBSIELLA PNEUMONIAE NO ANAEROBES ISOLATED; CULTURE IN PROGRESS FOR 5 DAYS    Report Status PENDING  Incomplete   Organism ID, Bacteria ESCHERICHIA COLI  Final   Organism ID, Bacteria KLEBSIELLA PNEUMONIAE  Final      Susceptibility   Escherichia coli - MIC*    AMPICILLIN 16 INTERMEDIATE Intermediate     CEFAZOLIN <=4 SENSITIVE Sensitive     CEFEPIME <=1 SENSITIVE Sensitive     CEFTAZIDIME <=1 SENSITIVE Sensitive     CEFTRIAXONE <=1 SENSITIVE Sensitive     CIPROFLOXACIN <=0.25 SENSITIVE Sensitive     GENTAMICIN <=1 SENSITIVE  Sensitive     IMIPENEM <=0.25 SENSITIVE Sensitive     TRIMETH/SULFA <=20 SENSITIVE Sensitive     AMPICILLIN/SULBACTAM 4 SENSITIVE Sensitive     PIP/TAZO <=4 SENSITIVE Sensitive     Extended ESBL NEGATIVE Sensitive     * RARE ESCHERICHIA COLI   Klebsiella pneumoniae - MIC*    AMPICILLIN >=32 RESISTANT Resistant     CEFAZOLIN <=4 SENSITIVE Sensitive     CEFEPIME <=1 SENSITIVE Sensitive     CEFTAZIDIME <=1 SENSITIVE Sensitive     CEFTRIAXONE <=1 SENSITIVE Sensitive     CIPROFLOXACIN <=0.25 SENSITIVE Sensitive     GENTAMICIN <=1 SENSITIVE Sensitive     IMIPENEM <=0.25 SENSITIVE Sensitive     TRIMETH/SULFA <=20 SENSITIVE Sensitive     AMPICILLIN/SULBACTAM 4 SENSITIVE Sensitive     PIP/TAZO 8 SENSITIVE Sensitive     Extended ESBL NEGATIVE Sensitive     * RARE KLEBSIELLA PNEUMONIAE    Assessment & Plan:  Patient was evaluated and treated and all questions answered.  Right Foot Cellulitis, abscess; complex right diabetic foot infection -Infection does appear controlled. No continued necrosis. No excessive warmth, cellulitis decreasing. No new labs to trend.  -Recommend IV abx at discharge. Due to extensive infection and that he will be on extended wound VAC therapy with open wound would consider 4 weeks of IV Ancef with HD with PO flagyl given similar bioavailability. Patient has [facility] Dr. Marland KitchenPlaced order for Wound VAC reapplication. Will plan for Tues/Sat application with [place] with reapplication in my office on Thursdays. Order placed for [facility] -Order home Wound VAC. -Will send post-op pain medication for discharge to patient's pharmacy. -Will round tomorrow on patient if still in house.  Evelina Bucy, DPM  Accessible via secure chat for questions or concerns.

## 2019-05-29 NOTE — Consult Note (Signed)
[place] Nurse wound consult note Reason for Consult:Consult received for placement of NPWT  Wound type:Surgical Pressure Injury POA: N/A  Dr. March Rummage contacted via Secure Chat regarding orders for NPWT dressing placement for today as well as process for obtaining home unit for NPWT. He plans to place NPWT this afternoon. I have assisted the bedside RN Edd Arbour) with obtaining black and white foam dressing kits (x2) and new cannister for the hospital NPWT unit.  [employer] representative [employer] notified of need for home NPWT unit and two types of dressing kits (black foam and white foam). Bedside RN is notified of need to involve Case Manager so that paperwork can be completed and a home unit delivered to the hospital prior to discharge.  [place] nursing team will not follow, but will remain available to this patient, the nursing and medical teams.   Thanks, Maudie Flakes, MSN, RN, [place], Arther Abbott  Pager# [phone]

## 2019-05-29 NOTE — Evaluation (Signed)
Occupational Therapy Evaluation Patient Details Name: [name] MRN: [id number] DOB: 1966-07-31 Today's Date: 05/29/2019    History of Present Illness [name] is a 53 y.o. male with medical history significant of diet-controlled type 2 diabetes, hypertension, ESRD on HD MWF, obesity, iron deficiency anemia presenting to the hospital as a direct admit by Dr. March Rummage from podiatry.  Status post recent right foot second toe partial amputation for osteomyelitis on 7/25.  He was discharged with a 2-week course of Keflex; Admitted 8/2 with fever; 8/7 3rd toe amputation;    Clinical Impression   PTA, pt was living with his significant other and was independent. Pt currently performing ADLs and functional mobility at supervision level. Providing education on LB ADLs, toileting, and shower transfer with 3N1; pt demonstrated understanding. Answered all pt questions. Recommend dc home once medically stable per physician. All acute OT needs met and will sign off. Thank you.     Follow Up Recommendations  No OT follow up    Equipment Recommendations  3 in 1 bedside commode;Other (comment)(RW)    Recommendations for Other Services       Precautions / Restrictions Precautions Precautions: Fall Restrictions Weight Bearing Restrictions: Yes RLE Weight Bearing: Weight bearing as tolerated Other Position/Activity Restrictions: CAM boot      Mobility Bed Mobility Overal bed mobility: Modified Independent                Transfers Overall transfer level: Needs assistance Equipment used: None Transfers: Sit to/from Stand Sit to Stand: Supervision         General transfer comment: supervision for safety    Balance Overall balance assessment: Mild deficits observed, not formally tested                                         ADL either performed or assessed with clinical judgement   ADL Overall ADL's : Needs assistance/impaired                                       General ADL Comments: Pt performing ADLs and functional mobility with RW to Supervision level. Providing education on compensatory techniques for LB ADLs (with wound vac), toileting, and shower transfer with 3N1     Vision         Perception     Praxis      Pertinent Vitals/Pain Pain Assessment: Faces Faces Pain Scale: Hurts a little bit Pain Location: Right foot Pain Descriptors / Indicators: Constant;Discomfort Pain Intervention(s): Monitored during session;Limited activity within patient's tolerance;Repositioned     Hand Dominance     Extremity/Trunk Assessment Upper Extremity Assessment Upper Extremity Assessment: Overall WFL for tasks assessed   Lower Extremity Assessment Lower Extremity Assessment: Defer to PT evaluation;RLE deficits/detail RLE Deficits / Details: 8/7 3rd toe amputation   Cervical / Trunk Assessment Cervical / Trunk Assessment: Normal   Communication Communication Communication: No difficulties   Cognition Arousal/Alertness: Awake/alert Behavior During Therapy: WFL for tasks assessed/performed Overall Cognitive Status: Within Functional Limits for tasks assessed                                     General Comments  Daughter present    Exercises  Shoulder Instructions      Home Living Family/patient expects to be discharged to:: Private residence Living Arrangements: Spouse/significant other Available Help at Discharge: Family;Available 24 hours/day Type of Home: House Home Access: Stairs to enter [employer] of Steps: 2-3 Entrance Stairs-Rails: None Home Layout: Two level Alternate Level Stairs-Number of Steps: 15   Bathroom Shower/Tub: Occupational psychologist: Standard     Home Equipment: None          Prior Functioning/Environment Level of Independence: Independent                 OT Problem List: Decreased activity tolerance;Impaired balance (sitting  and/or standing);Decreased knowledge of precautions;Decreased knowledge of use of DME or AE;Pain      OT Treatment/Interventions:      OT Goals(Current goals can be found in the care plan section) Acute Rehab OT Goals Patient Stated Goal: "Go home" OT Goal Formulation: All assessment and education complete, DC therapy  OT Frequency:     Barriers to D/C:            Co-evaluation              AM-PAC OT "6 Clicks" Daily Activity     Outcome Measure Help from another person eating meals?: None Help from another person taking care of personal grooming?: None Help from another person toileting, which includes using toliet, bedpan, or urinal?: None Help from another person bathing (including washing, rinsing, drying)?: None Help from another person to put on and taking off regular upper body clothing?: None Help from another person to put on and taking off regular lower body clothing?: None 6 Click Score: 24   End of Session Equipment Utilized During Treatment: Rolling walker Nurse Communication: Mobility status;Weight bearing status  Activity Tolerance: Patient tolerated treatment well Patient left: in bed;with call bell/phone within reach;with family/visitor present  OT Visit Diagnosis: Unsteadiness on feet (R26.81);Other abnormalities of gait and mobility (R26.89);Pain Pain - Right/Left: Right Pain - part of body: Ankle and joints of foot                Time: 1164-3539 OT Time Calculation (min): 33 min Charges:  OT General Charges $OT Visit: 1 Visit OT Evaluation $OT Eval Low Complexity: 1 Low  Ori Trejos MSOT, OTR/L Acute Rehab Pager: [phone] Office: [place] 05/29/2019, 1:12 PM

## 2019-05-30 ENCOUNTER — Other Ambulatory Visit: Payer: Self-pay | Admitting: Podiatry

## 2019-05-30 LAB — CBC
HCT: 25.5 % — ABNORMAL LOW (ref 39.0–52.0)
Hemoglobin: 8 g/dL — ABNORMAL LOW (ref 13.0–17.0)
MCH: 27.7 pg (ref 26.0–34.0)
MCHC: 31.4 g/dL (ref 30.0–36.0)
MCV: 88.2 fL (ref 80.0–100.0)
Platelets: 352 10*3/uL (ref 150–400)
RBC: 2.89 MIL/uL — ABNORMAL LOW (ref 4.22–5.81)
RDW: 16.9 % — ABNORMAL HIGH (ref 11.5–15.5)
WBC: 9.7 10*3/uL (ref 4.0–10.5)
nRBC: 0 % (ref 0.0–0.2)

## 2019-05-30 LAB — COMPREHENSIVE METABOLIC PANEL
ALT: 12 U/L (ref 0–44)
AST: 24 U/L (ref 15–41)
Albumin: 2.2 g/dL — ABNORMAL LOW (ref 3.5–5.0)
Alkaline Phosphatase: 221 U/L — ABNORMAL HIGH (ref 38–126)
Anion gap: 19 — ABNORMAL HIGH (ref 5–15)
BUN: 52 mg/dL — ABNORMAL HIGH (ref 6–20)
CO2: 20 mmol/L — ABNORMAL LOW (ref 22–32)
Calcium: 8.9 mg/dL (ref 8.9–10.3)
Chloride: 97 mmol/L — ABNORMAL LOW (ref 98–111)
Creatinine, Ser: 11.88 mg/dL — ABNORMAL HIGH (ref 0.61–1.24)
GFR calc Af Amer: 5 mL/min — ABNORMAL LOW (ref >60)
GFR calc non Af Amer: 4 mL/min — ABNORMAL LOW (ref >60)
Glucose, Bld: 107 mg/dL — ABNORMAL HIGH (ref 70–99)
Potassium: 3.9 mmol/L (ref 3.5–5.1)
Sodium: 136 mmol/L (ref 135–145)
Total Bilirubin: 0.5 mg/dL (ref 0.3–1.2)
Total Protein: 6.4 g/dL — ABNORMAL LOW (ref 6.5–8.1)

## 2019-05-30 LAB — AEROBIC/ANAEROBIC CULTURE W GRAM STAIN (SURGICAL/DEEP WOUND): Gram Stain: NONE SEEN

## 2019-05-30 LAB — GLUCOSE, CAPILLARY
Glucose-Capillary: 105 mg/dL — ABNORMAL HIGH (ref 70–99)
Glucose-Capillary: 114 mg/dL — ABNORMAL HIGH (ref 70–99)
Glucose-Capillary: 131 mg/dL — ABNORMAL HIGH (ref 70–99)
Glucose-Capillary: 197 mg/dL — ABNORMAL HIGH (ref 70–99)
Glucose-Capillary: 87 mg/dL (ref 70–99)

## 2019-05-30 LAB — PHOSPHORUS: Phosphorus: 7.6 mg/dL — ABNORMAL HIGH (ref 2.5–4.6)

## 2019-05-30 MED ORDER — METRONIDAZOLE 500 MG PO TABS: 500.0000 mg | ORAL_TABLET | Freq: Three times a day (TID) | ORAL | 0 refills | Status: AC

## 2019-05-30 MED ORDER — CEFAZOLIN SODIUM-DEXTROSE 2-4 GM/100ML-% IV SOLN: 2.0000 g | INTRAVENOUS | 0 refills | Status: AC

## 2019-05-30 MED ORDER — CEFAZOLIN SODIUM-DEXTROSE 2-4 GM/100ML-% IV SOLN
2.0000 g | INTRAVENOUS | Status: DC
  Administered 2019-05-30 (×2): 2 g via INTRAVENOUS
  Filled 2019-05-30 (×2): qty 100

## 2019-05-30 MED ORDER — HYDROMORPHONE HCL 2 MG PO TABS: 2.0000 mg | ORAL_TABLET | ORAL | 0 refills | Status: DC | PRN

## 2019-05-30 NOTE — Progress Notes (Signed)
PT Cancellation Note  Patient Details Name: [name] MRN: [id number] DOB: August 15, 1966   Cancelled Treatment:    Reason Eval/Treat Not Completed: Patient declined, no reason specified- patient reports he is leaving, feels good about his mobility. No concerns or needs at this time.    Tabitha Tupper 05/30/2019, 3:18 PM

## 2019-05-30 NOTE — Discharge Summary (Addendum)
Physician Discharge Summary  TAMMIE ELLSWORTH ZOX:[id number] DOB: 1966/08/20 DOA: 05/22/2019  PCP: Hayden Rasmussen, MD  Admit date: 05/22/2019 Discharge date: 05/30/2019  Admitted From: Home Disposition: Home  Recommendations for Outpatient Follow-up:  1. Follow up with PCP in 1-2 weeks; podiatry as scheduled, dialysis Monday Wednesday Friday as scheduled 2. Please obtain BMP/CBC in one week  Equipment/Devices: Wound VAC  Discharge Condition: Guarded CODE STATUS: Full Diet recommendation: Renal, diabetic diet  Brief/Interim Summary: Dorothyann Peng a 53 y.o.malewith medical history significant ofdiet-controlled type 2 diabetes, hypertension, ESRD on HD, obesity, iron deficiency anemia presenting to the hospital as a direct admit by Dr. March Rummage from podiatry. Status post recent right foot second toe partial amputation for osteomyelitis on 7/25. He was discharged with a 2-week course of Keflex.ABIs revealed noncompressible right lower extremity arteries and noncompressible left lower extremity arteries. Left toe brachial index abnormal. Patient also underwent one-vessel posterior tibial artery runoff of bilateral foot. Patient was seen by Dr. March Rummage today and found to have temperature 101.6 F. Surgical site with delayed healing but without frank purulence. He had erythema and swelling of the right lower extremity. Given signs of infection he was sent to the hospital for IV antibiotics and possible surgical debridement. Patient states he has been having low-grade fevers since he left the hospital. Last night his temperature went up to 101 F range. He started noticing erythema and swelling extending up from his foot to his ankle. He is having some discomfort in his foot from wearing an Haematologist. States he has not been getting Keflex at dialysis as planned. States 2 days ago Dr. March Rummage switched him to clindamycin and he has been taking this medication. Denies any shortness of breath, cough,  nausea, vomiting, or abdominal pain.  Patient's foot is improved per the patient but he is having significant amount of gas due to his lactose intolerance.  Patient was dialyzed today and likely will undergo surgical intervention.  Will defer further imaging studies to podiatry and may consider an MRI of the foot if patient does not improve significantly.  Podiatry evaluated and Dr. March Rummage felt that the patient had an abscess so he underwent I&D w/ second toe amputation on 8/3 and subsequent debridement/irrigation on 8/5 with improved swelling and pain in the leg.  Patient's cultures were positive for E. coli and Klebsiella, sensitive to cefazolin and will be continued on this regiment Monday Wednesday Friday for a minimum of 4 weeks, podiatry requesting an additional 4 weeks of flagyl coverage as well - podiatry/nephro/PCP will continue to follow in the outpatient setting, may be necessary to prolong patient's antibiotic duration pending clinical improvement and wound evaluation in the future.  Patient otherwise stable and agreeable for discharge home.  Patient has wound VAC provided by podiatry will be followed in their clinic.  *Patient unable to discharge today due to equipment issue. Currently awaiting home wound vac delivery, otherwise medically stable for discharge.  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 Unknown Did it involve sudden or severe rash/hives, skin peeling, or any reaction on the inside of your mouth or nose?  Unknown Did you need to seek medical attention at a hospital or doctor's office? Unknown When did it last happen?20 years ago If all above answers are "NO", may proceed with cephalosporin use.   Hydromorphone Itching   Patient states he may take with benadryl. Makes feet itch.   Percocet [oxycodone-acetaminophen] Itching, Other (See Comments)   Legs only. Itching and burning feeling.      Medication List    STOP taking these medications   aspirin 81 MG EC tablet   atorvastatin 40 MG tablet Commonly known as: LIPITOR   cephALEXin 500 MG capsule Commonly known as: KEFLEX   clindamycin 300 MG capsule Commonly known as: Cleocin   feeding supplement (PRO-STAT SUGAR FREE 64) Liqd   lidocaine 2 % jelly Commonly known as: XYLOCAINE     TAKE these medications   Accu-Chek Aviva Plus test strip Generic drug: glucose blood Use as instructed   accu-chek softclix lancets Use as instructed   acetaminophen 500 MG tablet Commonly known as: TYLENOL Take 1,000 mg by mouth every 6 (six) hours as needed for moderate pain or headache.   Auryxia 1 GM 210 MG(Fe) tablet Generic drug: ferric citrate Take 210-630 mg by mouth See admin instructions. Take 3 tablets (630 mg) by mouth up to three times daily with meals, take 1 tablet (210 mg) with snacks   blood glucose meter kit and supplies Dispense based on patient and insurance preference. Use up to four times daily as directed. (FOR ICD-10 E10.9, E11.9).   blood glucose meter kit and supplies Dispense based on patient and insurance preference. Use up to four times daily as directed. (FOR ICD-9 250.00, 250.01).   ceFAZolin 2-4 GM/100ML-% IVPB Commonly known as: ANCEF Inject 100 mLs (2 g total) into the vein every Monday, Wednesday, and Friday at 8 PM for 12 doses. After dialysis   Fosrenol 1000 MG Pack Generic drug: Lanthanum Carbonate Take 1,000 mg by mouth 3 (three) times daily with meals.   HYDROcodone-acetaminophen 10-325  MG tablet Commonly known as: NORCO Take 1 tablet by mouth every 8 (eight) hours as needed. What changed: reasons to take this   loratadine 10 MG tablet Commonly known as: CLARITIN Take 10 mg by mouth daily as needed for allergies.   Melatonin 5 MG Caps Take 10 mg by mouth at bedtime as needed (sleep).   multivitamin Tabs tablet Take 1 tablet by mouth See admin instructions. Take one tablet by mouth on Monday, Wednesday, Friday after dialysis   OVER THE COUNTER MEDICATION Take 1 Bottle by mouth 2 (two) times a day. Vegan Protein Shake       Allergies  Allergen Reactions  . Amoxicillin Itching    Did it involve swelling of the face/tongue/throat, SOB, or low BP? Unknown Did it involve sudden or severe rash/hives, skin peeling, or any reaction on the inside of your mouth or nose? Unknown Did you need to seek medical attention at a hospital or doctor's office? Unknown When did it last happen?20 years ago If all above answers are "NO", may proceed with cephalosporin use.   Marland Kitchen Hydromorphone Itching    Patient states he may take with benadryl. Makes feet itch.  Marland Kitchen Percocet [Oxycodone-Acetaminophen] Itching and Other (See Comments)    Legs only. Itching and burning feeling.    Consultations:  Podiatry, Nephrology   Procedures/Studies: Dg Ankle Complete Right  Result Date: 05/09/2019 Please see detailed radiograph report in office note.  Dg Foot 2 Views Right  Result Date: 05/24/2019 CLINICAL DATA:  Cellulitis.  Abscess.  Second toe amputation. EXAM: RIGHT FOOT - 2 VIEW COMPARISON:  05/22/2019.  05/14/2019. FINDINGS: Right second digit amputation. Distal right second metatarsal head intact. Overlying gauze present. Deformity of the distal phalanx of the right great toe unchanged. Diffuse osteopenia and degenerative change. No radiopaque foreign body. IMPRESSION: 1. Right second digit amputation. Distal right second metatarsal head is intact. 2. Deformity of the distal  phalanx of the right great toe again noted. Fracture and/or osteomyelitis of the distal phalanx of the right great could present in this fashion. 3.  No radiopaque foreign body. Electronically Signed   By: Marcello Moores  Register   On: 05/24/2019 07:52   Dg Foot 2 Views Right  Result Date: 05/22/2019 CLINICAL DATA:  Diabetic ulcer of right foot EXAM: RIGHT FOOT - 2 VIEW COMPARISON:  05/14/2019 FINDINGS: Prior amputation of the right 2nd toe at the PIP joint. No acute bony abnormality. Specifically, no fracture, subluxation, or dislocation. No bone destruction to suggest osteomyelitis. IMPRESSION: Prior right toe amputation.  No acute bony abnormality. Electronically Signed   By: Rolm Baptise M.D.   On: 05/22/2019 23:20   Dg Foot 2 Views Right  Result Date: 05/14/2019 CLINICAL DATA:  S/p partial amputation of the 2nd toe. EXAM: RIGHT FOOT - 2 VIEW COMPARISON:  05/06/2011 FINDINGS: Amputation of the 2nd digit from the PIP noted. Irregularity of the great toe distal phalanx is unchanged. No other significant abnormalities identified. 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 Electronically Signed   By: Margarette Canada M.D.   On: 05/14/2019 12:23   Dg Foot 2 Views Right  Result Date: 05/09/2019 Please see detailed radiograph report in office note.  Vas [place] Burnard Bunting With/wo Tbi  Result Date: 05/15/2019 LOWER EXTREMITY DOPPLER STUDY Indications: Ulceration, gangrene, and Osteomyelitis. Status post toe amputation              and right hallux bone biopsy 05/14/19 High Risk Factors: Hypertension, hyperlipidemia, Diabetes. Other Factors: ESRD on dialysis.  Limitations: Bandages, surgery 05/14/19 Comparison Study: No prior study on file for comparison. Performing Technologist: Sharion Dove RVS  Examination Guidelines: A complete evaluation includes at minimum, Doppler waveform signals and systolic blood pressure reading at the level of bilateral brachial, anterior tibial, and posterior tibial arteries, when vessel  segments are accessible. Bilateral testing is considered an integral part of a complete examination. Photoelectric Plethysmograph (PPG) waveforms and toe systolic pressure readings are included as required and additional duplex testing as needed. Limited examinations for reoccurring indications may be performed as noted.  ABI Findings: +---------+------------------+-----+--------+---------------+ Right    Rt Pressure (mmHg)IndexWaveformComment         +---------+------------------+-----+--------+---------------+ Brachial                                Dialysis access +---------+------------------+-----+--------+---------------+ PTA      255               1.42 biphasic                +---------+------------------+-----+--------+---------------+ DP  bandages        +---------+------------------+-----+--------+---------------+ Great Toe                               bandages        +---------+------------------+-----+--------+---------------+ +---------+------------------+-----+----------+-------+ Left     Lt Pressure (mmHg)IndexWaveform  Comment +---------+------------------+-----+----------+-------+ Brachial 180                    triphasic         +---------+------------------+-----+----------+-------+ PTA      254               1.41 biphasic          +---------+------------------+-----+----------+-------+ DP       119               0.66 monophasic        +---------+------------------+-----+----------+-------+ Great Toe86                0.48                   +---------+------------------+-----+----------+-------+ +-------+-----------+-----------+------------+------------+ ABI/TBIToday's ABIToday's TBIPrevious ABIPrevious TBI +-------+-----------+-----------+------------+------------+ Right  1.4        bandages                            +-------+-----------+-----------+------------+------------+ Left   1.4         0.48                                +-------+-----------+-----------+------------+------------+ Arterial wall calcification precludes accurate ankle pressures and ABIs. No prior study on file for comparison  Summary: Right: Resting right ankle-brachial index indicates noncompressible right lower extremity arteries. Left: Resting left ankle-brachial index indicates noncompressible left lower extremity arteries.The left toe-brachial index is abnormal.  *See table(s) above for measurements and observations.  Electronically signed by Deitra Mayo MD on 05/15/2019 at 7:07:21 PM.    Final    Vas [place] Lower Extremity Arterial Duplex  Result Date: 05/16/2019 LOWER EXTREMITY ARTERIAL DUPLEX STUDY Indications: Ulceration, gangrene, and Osteomyelitis. Status post toe amputation              and right hallux bone biopsy 05/14/19. High Risk Factors: Hypertension, hyperlipidemia, Diabetes.  Current ABI: 05/15/19 non compressible Comparison Study: no prior Performing Technologist: June Leap RDMS, RVT  Examination Guidelines: A complete evaluation includes B-mode imaging, spectral Doppler, color Doppler, and power Doppler as needed of all accessible portions of each vessel. Bilateral testing is considered an integral part of a complete examination. Limited examinations for reoccurring indications may be performed as noted.  +-----------+--------+-----+--------+----------+-------------------------------+ RIGHT      PSV cm/sRatioStenosisWaveform  Comments                        +-----------+--------+-----+--------+----------+-------------------------------+ CFA Prox   100                  triphasic mild heterogenous plaque        +-----------+--------+-----+--------+----------+-------------------------------+ DFA        78                   triphasic                                 +-----------+--------+-----+--------+----------+-------------------------------+  SFA Prox   125                   triphasic                                 +-----------+--------+-----+--------+----------+-------------------------------+ SFA Mid    125                  triphasic                                 +-----------+--------+-----+--------+----------+-------------------------------+ SFA Distal 140                  triphasic calcified plaque with shadowing +-----------+--------+-----+--------+----------+-------------------------------+ POP Prox   112                  triphasic mild heterogenous plaque        +-----------+--------+-----+--------+----------+-------------------------------+ POP Distal 84                   biphasic                                  +-----------+--------+-----+--------+----------+-------------------------------+ ATA Distal 19                   biphasic  calcification of vessel walls   +-----------+--------+-----+--------+----------+-------------------------------+ PTA Distal 90                   monophasiccalcification of vessel walls   +-----------+--------+-----+--------+----------+-------------------------------+ PERO Distal                     absent    calcification of vessel walls   +-----------+--------+-----+--------+----------+-------------------------------+  Summary: Right: Calcification of pedal arteries with dampened flow. Absent peroneal artery flow. No focal stenosis or occlusion noted.  See table(s) above for measurements and observations. Electronically signed by Ruta Hinds MD on 05/16/2019 at 6:00:07 PM.    Final    Vas [place] Lower Extremity Venous (dvt) (only [place])  Result Date: 05/14/2019  Lower Venous Study Indications: Pain, Swelling, and infection of foot.  Limitations: Edema, shadowing, and leg elevation. Comparison Study: No prior study on file for comparison. Performing Technologist: Sharion Dove RVS  Examination Guidelines: A complete evaluation includes B-mode imaging, spectral Doppler, color Doppler, and  power Doppler as needed of all accessible portions of each vessel. Bilateral testing is considered an integral part of a complete examination. Limited examinations for reoccurring indications may be performed as noted.  +---------+---------------+---------+-----------+----------+-------------------+ RIGHT    CompressibilityPhasicitySpontaneityPropertiesSummary             +---------+---------------+---------+-----------+----------+-------------------+ CFV      Full           Yes      Yes                                      +---------+---------------+---------+-----------+----------+-------------------+ SFJ      Full                                                             +---------+---------------+---------+-----------+----------+-------------------+  FV Prox  Full                                                             +---------+---------------+---------+-----------+----------+-------------------+ FV Mid   Full                                                             +---------+---------------+---------+-----------+----------+-------------------+ FV DistalFull                                                             +---------+---------------+---------+-----------+----------+-------------------+ PFV      Full                                                             +---------+---------------+---------+-----------+----------+-------------------+ POP      Full           Yes      Yes                                      +---------+---------------+---------+-----------+----------+-------------------+ PTV                                                   visualized by color +---------+---------------+---------+-----------+----------+-------------------+ PERO                                                  visualized by color +---------+---------------+---------+-----------+----------+-------------------+    +----+---------------+---------+-----------+----------+-------+ LEFTCompressibilityPhasicitySpontaneityPropertiesSummary +----+---------------+---------+-----------+----------+-------+ CFV Full           Yes      Yes                          +----+---------------+---------+-----------+----------+-------+     Summary: Right: There is no evidence of deep vein thrombosis in the lower extremity. However, portions of this examination were limited- see technologist comments above. Left: No evidence of common femoral vein obstruction.  *See table(s) above for measurements and observations. Electronically signed by Deitra Mayo MD on 05/14/2019 at 9:54:30 AM.    Final    US Abdomen Limited Ruq  Result Date: 05/25/2019 CLINICAL DATA:  Abnormal LFTs. EXAM: ULTRASOUND ABDOMEN LIMITED RIGHT UPPER QUADRANT COMPARISON:  Ultrasound 10/06/2013. FINDINGS: Gallbladder: Multiple gallstones measuring up to 1.1 cm. Gallbladder wall thickness 2.6 mm. Negative Murphy sign. Common bile duct: Diameter: 4.1 mm Liver: No focal lesion identified. Within  normal limits in parenchymal echogenicity. Portal vein is patent on color Doppler imaging with normal direction of blood flow towards the liver. Other: Trace ascites. IMPRESSION: 1. Multiple gallstones. Gallbladder wall thickness 2.6 mm. Negative Murphy sign. No biliary distention. 2. Trace ascites. Electronically Signed   By: Marcello Moores  Register   On: 05/25/2019 08:44    Subjective: No acute issues or events overnight, tolerated bandage change and wound VAC placement quite well, tolerating dialysis now without issue.  Otherwise stable and agreeable for discharge home.   Discharge Exam: Vitals:   05/30/19 1030 05/30/19 1100  BP: (!) 163/102 (!) 156/99  Pulse: 92 93  Resp: 17 16  Temp:    SpO2:     Vitals:   05/30/19 0930 05/30/19 1000 05/30/19 1030 05/30/19 1100  BP: (!) 165/87 (!) 149/86 (!) 163/102 (!) 156/99  Pulse: 99 90 92 93  Resp: '16 17 17 16   ' Temp:      TempSrc:      SpO2:      Weight:      Height:       Physical Exam Constitution:  Alert, cooperative, no distress,  Psychiatric: Normal and stable mood and affect, cognition intact,   HEENT: Normocephalic, PERRL, otherwise with in Normal limits  Chest:Chest symmetric Cardio vascular: S1/S2, RRR, No murmure, No Rubs or Gallops  pulmonary: Clear to auscultation bilaterally, respirations unlabored, negative wheezes / crackles Abdomen: Soft, non-tender, non-distended, bowel sounds,no masses, no organomegaly, negative murphy's sign Muscular skeletal:Limited exam - in bed, able to move all 4 extremities, Normal strength,  Neuro: CNII-XII intact. , normal motor and sensation, reflexes intact  Musculoskeletal: No changes, wound vac/dressing still in place right foot. Right upper arm AV fistula currently accessed for dialysis Skin: Dry warm to touch, right foot/second toe surgical wound, wrapped -wound vac intact   The results of significant diagnostics from this hospitalization (including imaging, microbiology, ancillary and laboratory) are listed below for reference.     Microbiology: Recent Results (from the past 240 hour(s))  SARS Coronavirus 2 [facility] order, Performed in [place] hospital lab)     Status: None   Collection Time: 05/22/19  8:48 PM  Result Value Ref Range Status   SARS Coronavirus 2 NEGATIVE NEGATIVE Final    Comment: (NOTE) If result is NEGATIVE SARS-CoV-2 target nucleic acids are NOT DETECTED. The SARS-CoV-2 RNA is generally detectable in upper and lower  respiratory specimens during the acute phase of infection. The lowest  concentration of SARS-CoV-2 viral copies this assay can detect is 250  copies / mL. A negative result does not preclude SARS-CoV-2 infection  and should not be used as the sole basis for treatment or other  patient management decisions.  A negative result may occur with  improper specimen collection / handling, submission of  specimen other  than nasopharyngeal swab, presence of viral mutation(s) within the  areas targeted by this assay, and inadequate number of viral copies  (<250 copies / mL). A negative result must be combined with clinical  observations, patient history, and epidemiological information. If result is POSITIVE SARS-CoV-2 target nucleic acids are DETECTED. The SARS-CoV-2 RNA is generally detectable in upper and lower  respiratory specimens dur ing the acute phase of infection.  Positive  results are indicative of active infection with SARS-CoV-2.  Clinical  correlation with patient history and other diagnostic information is  necessary to determine patient infection status.  Positive results do  not rule out bacterial infection or co-infection with other viruses.  If result is PRESUMPTIVE POSTIVE SARS-CoV-2 nucleic acids MAY BE PRESENT.   A presumptive positive result was obtained on the submitted specimen  and confirmed on repeat testing.  While 2019 novel coronavirus  (SARS-CoV-2) nucleic acids may be present in the submitted sample  additional confirmatory testing may be necessary for epidemiological  and / or clinical management purposes  to differentiate between  SARS-CoV-2 and other Sarbecovirus currently known to infect humans.  If clinically indicated additional testing with an alternate test  methodology [phone]) is advised. The SARS-CoV-2 RNA is generally  detectable in upper and lower respiratory sp ecimens during the acute  phase of infection. The expected result is Negative. Fact Sheet for Patients:  StrictlyIdeas.no Fact Sheet for Healthcare Providers: BankingDealers.co.za This test is not yet approved or cleared by the [place] FDA and has been authorized for detection and/or diagnosis of SARS-CoV-2 by FDA under an Emergency Use Authorization (EUA).  This EUA will remain in effect (meaning this test can be used) for the  duration of the COVID-19 declaration under Section 564(b)(1) of the Act, 21 U.S.C. section 360bbb-3(b)(1), unless the authorization is terminated or revoked sooner. Performed at [facility], [place] [address], [place], [place] [address]   Aerobic/Anaerobic Culture (surgical/deep wound)     Status: None   Collection Time: 05/24/19 12:26 AM   Specimen: Wound; Abscess  Result Value Ref Range Status   Specimen Description WOUND RIGHT FOOT  Final   Special Requests ID A  Final   Gram Stain NO WBC SEEN RARE GRAM NEGATIVE RODS   Final   Culture   Final    FEW ESCHERICHIA COLI FEW KLEBSIELLA PNEUMONIAE MODERATE BACTEROIDES VULGATUS BETA LACTAMASE POSITIVE Performed at [facility], [place] [address], [place], [place] [address]    Report Status 05/29/2019 FINAL  Final   Organism ID, Bacteria ESCHERICHIA COLI  Final   Organism ID, Bacteria KLEBSIELLA PNEUMONIAE  Final      Susceptibility   Escherichia coli - MIC*    AMPICILLIN 16 INTERMEDIATE Intermediate     CEFAZOLIN <=4 SENSITIVE Sensitive     CEFEPIME <=1 SENSITIVE Sensitive     CEFTAZIDIME <=1 SENSITIVE Sensitive     CEFTRIAXONE <=1 SENSITIVE Sensitive     CIPROFLOXACIN <=0.25 SENSITIVE Sensitive     GENTAMICIN <=1 SENSITIVE Sensitive     IMIPENEM <=0.25 SENSITIVE Sensitive     TRIMETH/SULFA <=20 SENSITIVE Sensitive     AMPICILLIN/SULBACTAM 4 SENSITIVE Sensitive     PIP/TAZO <=4 SENSITIVE Sensitive     Extended ESBL NEGATIVE Sensitive     * FEW ESCHERICHIA COLI   Klebsiella pneumoniae - MIC*    AMPICILLIN >=32 RESISTANT Resistant     CEFAZOLIN <=4 SENSITIVE Sensitive     CEFEPIME <=1 SENSITIVE Sensitive     CEFTAZIDIME <=1 SENSITIVE Sensitive     CEFTRIAXONE <=1 SENSITIVE Sensitive     CIPROFLOXACIN <=0.25 SENSITIVE Sensitive     GENTAMICIN <=1 SENSITIVE Sensitive     IMIPENEM <=0.25 SENSITIVE Sensitive     TRIMETH/SULFA <=20 SENSITIVE Sensitive     AMPICILLIN/SULBACTAM 4 SENSITIVE Sensitive     PIP/TAZO 8  SENSITIVE Sensitive     Extended ESBL NEGATIVE Sensitive     * FEW KLEBSIELLA PNEUMONIAE  Culture, blood (Routine X 2) w Reflex to ID Panel     Status: None   Collection Time: 05/24/19  7:25 AM   Specimen: BLOOD LEFT HAND  Result Value Ref Range Status   Specimen Description BLOOD  LEFT HAND  Final   Special Requests   Final    BOTTLES DRAWN AEROBIC ONLY Blood Culture results may not be optimal due to an inadequate volume of blood received in culture bottles   Culture   Final    NO GROWTH 5 DAYS Performed at [facility], [place] [address] [place], [place] [address]    Report Status 05/29/2019 FINAL  Final  Culture, blood (Routine X 2) w Reflex to ID Panel     Status: None   Collection Time: 05/24/19  7:29 AM   Specimen: BLOOD LEFT HAND  Result Value Ref Range Status   Specimen Description BLOOD LEFT HAND  Final   Special Requests   Final    BOTTLES DRAWN AEROBIC ONLY Blood Culture results may not be optimal due to an inadequate volume of blood received in culture bottles   Culture   Final    NO GROWTH 5 DAYS Performed at [facility], [place] [address], [place], [place] [address]    Report Status 05/29/2019 FINAL  Final  Aerobic/Anaerobic Culture (surgical/deep wound)     Status: None (Preliminary result)   Collection Time: 05/25/19  9:25 AM   Specimen: Wound  Result Value Ref Range Status   Specimen Description WOUND RIGHT FOOT  Final   Special Requests NONE  Final   Gram Stain   Final    NO WBC SEEN NO ORGANISMS SEEN Performed at [facility], [place] [address] [place], [place] [address]    Culture   Final    RARE ESCHERICHIA COLI RARE KLEBSIELLA PNEUMONIAE NO ANAEROBES ISOLATED; CULTURE IN PROGRESS FOR 5 DAYS    Report Status PENDING  Incomplete   Organism ID, Bacteria ESCHERICHIA COLI  Final   Organism ID, Bacteria KLEBSIELLA PNEUMONIAE  Final      Susceptibility   Escherichia coli - MIC*    AMPICILLIN 16 INTERMEDIATE Intermediate     CEFAZOLIN <=4  SENSITIVE Sensitive     CEFEPIME <=1 SENSITIVE Sensitive     CEFTAZIDIME <=1 SENSITIVE Sensitive     CEFTRIAXONE <=1 SENSITIVE Sensitive     CIPROFLOXACIN <=0.25 SENSITIVE Sensitive     GENTAMICIN <=1 SENSITIVE Sensitive     IMIPENEM <=0.25 SENSITIVE Sensitive     TRIMETH/SULFA <=20 SENSITIVE Sensitive     AMPICILLIN/SULBACTAM 4 SENSITIVE Sensitive     PIP/TAZO <=4 SENSITIVE Sensitive     Extended ESBL NEGATIVE Sensitive     * RARE ESCHERICHIA COLI   Klebsiella pneumoniae - MIC*    AMPICILLIN >=32 RESISTANT Resistant     CEFAZOLIN <=4 SENSITIVE Sensitive     CEFEPIME <=1 SENSITIVE Sensitive     CEFTAZIDIME <=1 SENSITIVE Sensitive     CEFTRIAXONE <=1 SENSITIVE Sensitive     CIPROFLOXACIN <=0.25 SENSITIVE Sensitive     GENTAMICIN <=1 SENSITIVE Sensitive     IMIPENEM <=0.25 SENSITIVE Sensitive     TRIMETH/SULFA <=20 SENSITIVE Sensitive     AMPICILLIN/SULBACTAM 4 SENSITIVE Sensitive     PIP/TAZO 8 SENSITIVE Sensitive     Extended ESBL NEGATIVE Sensitive     * RARE KLEBSIELLA PNEUMONIAE     Labs: BNP (last 3 results) No results for input(s): BNP in the last 8760 hours. Basic Metabolic Panel: Recent Labs  Lab 05/23/19 1139 05/24/19 0722 05/25/19 1605 05/26/19 0434 05/27/19 0500 05/30/19 0837  NA 134* 135 133* 133* 132* 136  K 4.7 4.3 4.2 3.7 3.5 3.9  CL 94* 92* 93* 91* 92* 97*  CO2  '23 25 23 25 22 ' 20*  GLUCOSE 144* 131* 132* 141* 134* 107*  BUN 81* 42* 67* 34* 51* 52*  CREATININE 14.52* 9.50* 12.43* 7.87* 11.07* 11.88*  CALCIUM 8.9 8.9 8.9 8.8* 8.9 8.9  MG 2.6* 2.3 2.5*  --   --   --   PHOS 7.9* 6.6* 7.7*  --   --  7.6*   Liver Function Tests: Recent Labs  Lab 05/23/19 1139 05/24/19 0722 05/25/19 1605 05/26/19 0434 05/27/19 0500 05/30/19 0837  AST 82* 75*  --  18 13* 24  ALT 77* 92*  --  51* 36 12  ALKPHOS 248* 330*  --  283* 236* 221*  BILITOT 1.1 0.7  --  0.8 0.9 0.5  PROT 6.6 6.4*  --  6.6 6.5 6.4*  ALBUMIN 2.2* 2.1* 2.1* 2.0* 2.1* 2.2*   No results for  input(s): LIPASE, AMYLASE in the last 168 hours. No results for input(s): AMMONIA in the last 168 hours. CBC: Recent Labs  Lab 05/23/19 1139 05/24/19 0722 05/25/19 1606 05/26/19 0434 05/27/19 0500 05/30/19 0837  WBC 16.4* 18.8* 11.3* 15.6* 13.0* 9.7  NEUTROABS 14.3* 16.4*  --  12.9* 10.3*  --   HGB 7.5* 7.9* 8.4* 7.7* 6.9* 8.0*  HCT 23.3* 25.0* 26.8* 24.6* 22.5* 25.5*  MCV 87.6 88.7 88.4 89.8 90.7 88.2  PLT 386 340 334 361 369 352   Cardiac Enzymes: No results for input(s): CKTOTAL, CKMB, CKMBINDEX, TROPONINI in the last 168 hours. BNP: Invalid input(s): POCBNP CBG: Recent Labs  Lab 05/26/19 0003 05/27/19 1845 05/30/19 0002 05/30/19 0357 05/30/19 0657  GLUCAP 129* 114* 114* 87 105*   Urinalysis    Component Value Date/Time   COLORURINE YELLOW 06/06/2011 2300   APPEARANCEUR CLOUDY (A) 06/06/2011 2300   LABSPEC 1.021 06/06/2011 2300   PHURINE 5.0 06/06/2011 2300   GLUCOSEU NEGATIVE 06/06/2011 2300   GLUCOSEU > or = 1000 mg/dL (AA) 05/16/2008 1215   HGBUR SMALL (A) 06/06/2011 2300   HGBUR moderate 11/01/2007 0931   BILIRUBINUR NEGATIVE 06/06/2011 2300   KETONESUR NEGATIVE 06/06/2011 2300   PROTEINUR >300 (A) 06/06/2011 2300   UROBILINOGEN 0.2 06/06/2011 2300   NITRITE NEGATIVE 06/06/2011 2300   LEUKOCYTESUR SMALL (A) 06/06/2011 2300   Sepsis Labs Invalid input(s): PROCALCITONIN,  WBC,  LACTICIDVEN Microbiology Recent Results (from the past 240 hour(s))  SARS Coronavirus 2 [facility] order, Performed in [place] hospital lab)     Status: None   Collection Time: 05/22/19  8:48 PM  Result Value Ref Range Status   SARS Coronavirus 2 NEGATIVE NEGATIVE Final    Comment: (NOTE) If result is NEGATIVE SARS-CoV-2 target nucleic acids are NOT DETECTED. The SARS-CoV-2 RNA is generally detectable in upper and lower  respiratory specimens during the acute phase of infection. The lowest  concentration of SARS-CoV-2 viral copies this assay can detect is 250  copies /  mL. A negative result does not preclude SARS-CoV-2 infection  and should not be used as the sole basis for treatment or other  patient management decisions.  A negative result may occur with  improper specimen collection / handling, submission of specimen other  than nasopharyngeal swab, presence of viral mutation(s) within the  areas targeted by this assay, and inadequate number of viral copies  (<250 copies / mL). A negative result must be combined with clinical  observations, patient history, and epidemiological information. If result is POSITIVE SARS-CoV-2 target nucleic acids are DETECTED. The SARS-CoV-2 RNA is generally detectable in upper and lower  respiratory specimens dur ing the acute phase of infection.  Positive  results are indicative of active infection with SARS-CoV-2.  Clinical  correlation with patient history and other diagnostic information is  necessary to determine patient infection status.  Positive results do  not rule out bacterial infection or co-infection with other viruses. If result is PRESUMPTIVE POSTIVE SARS-CoV-2 nucleic acids MAY BE PRESENT.   A presumptive positive result was obtained on the submitted specimen  and confirmed on repeat testing.  While 2019 novel coronavirus  (SARS-CoV-2) nucleic acids may be present in the submitted sample  additional confirmatory testing may be necessary for epidemiological  and / or clinical management purposes  to differentiate between  SARS-CoV-2 and other Sarbecovirus currently known to infect humans.  If clinically indicated additional testing with an alternate test  methodology [phone]) is advised. The SARS-CoV-2 RNA is generally  detectable in upper and lower respiratory sp ecimens during the acute  phase of infection. The expected result is Negative. Fact Sheet for Patients:  StrictlyIdeas.no Fact Sheet for Healthcare Providers: BankingDealers.co.za This test is  not yet approved or cleared by the [place] FDA and has been authorized for detection and/or diagnosis of SARS-CoV-2 by FDA under an Emergency Use Authorization (EUA).  This EUA will remain in effect (meaning this test can be used) for the duration of the COVID-19 declaration under Section 564(b)(1) of the Act, 21 U.S.C. section 360bbb-3(b)(1), unless the authorization is terminated or revoked sooner. Performed at [facility], [place] [address] [place], [place] [address]   Aerobic/Anaerobic Culture (surgical/deep wound)     Status: None   Collection Time: 05/24/19 12:26 AM   Specimen: Wound; Abscess  Result Value Ref Range Status   Specimen Description WOUND RIGHT FOOT  Final   Special Requests ID A  Final   Gram Stain NO WBC SEEN RARE GRAM NEGATIVE RODS   Final   Culture   Final    FEW ESCHERICHIA COLI FEW KLEBSIELLA PNEUMONIAE MODERATE BACTEROIDES VULGATUS BETA LACTAMASE POSITIVE Performed at [facility], [place] [address], [place], [place] [address]    Report Status 05/29/2019 FINAL  Final   Organism ID, Bacteria ESCHERICHIA COLI  Final   Organism ID, Bacteria KLEBSIELLA PNEUMONIAE  Final      Susceptibility   Escherichia coli - MIC*    AMPICILLIN 16 INTERMEDIATE Intermediate     CEFAZOLIN <=4 SENSITIVE Sensitive     CEFEPIME <=1 SENSITIVE Sensitive     CEFTAZIDIME <=1 SENSITIVE Sensitive     CEFTRIAXONE <=1 SENSITIVE Sensitive     CIPROFLOXACIN <=0.25 SENSITIVE Sensitive     GENTAMICIN <=1 SENSITIVE Sensitive     IMIPENEM <=0.25 SENSITIVE Sensitive     TRIMETH/SULFA <=20 SENSITIVE Sensitive     AMPICILLIN/SULBACTAM 4 SENSITIVE Sensitive     PIP/TAZO <=4 SENSITIVE Sensitive     Extended ESBL NEGATIVE Sensitive     * FEW ESCHERICHIA COLI   Klebsiella pneumoniae - MIC*    AMPICILLIN >=32 RESISTANT Resistant     CEFAZOLIN <=4 SENSITIVE Sensitive     CEFEPIME <=1 SENSITIVE Sensitive     CEFTAZIDIME <=1 SENSITIVE Sensitive     CEFTRIAXONE <=1 SENSITIVE  Sensitive     CIPROFLOXACIN <=0.25 SENSITIVE Sensitive     GENTAMICIN <=1 SENSITIVE Sensitive     IMIPENEM <=0.25 SENSITIVE Sensitive     TRIMETH/SULFA <=20 SENSITIVE Sensitive     AMPICILLIN/SULBACTAM 4 SENSITIVE Sensitive     PIP/TAZO 8 SENSITIVE Sensitive  Extended ESBL NEGATIVE Sensitive     * FEW KLEBSIELLA PNEUMONIAE  Culture, blood (Routine X 2) w Reflex to ID Panel     Status: None   Collection Time: 05/24/19  7:25 AM   Specimen: BLOOD LEFT HAND  Result Value Ref Range Status   Specimen Description BLOOD LEFT HAND  Final   Special Requests   Final    BOTTLES DRAWN AEROBIC ONLY Blood Culture results may not be optimal due to an inadequate volume of blood received in culture bottles   Culture   Final    NO GROWTH 5 DAYS Performed at [facility], [place] [address], [place], [place] [address]    Report Status 05/29/2019 FINAL  Final  Culture, blood (Routine X 2) w Reflex to ID Panel     Status: None   Collection Time: 05/24/19  7:29 AM   Specimen: BLOOD LEFT HAND  Result Value Ref Range Status   Specimen Description BLOOD LEFT HAND  Final   Special Requests   Final    BOTTLES DRAWN AEROBIC ONLY Blood Culture results may not be optimal due to an inadequate volume of blood received in culture bottles   Culture   Final    NO GROWTH 5 DAYS Performed at [facility], [place] [address], [place], [place] [address]    Report Status 05/29/2019 FINAL  Final  Aerobic/Anaerobic Culture (surgical/deep wound)     Status: None (Preliminary result)   Collection Time: 05/25/19  9:25 AM   Specimen: Wound  Result Value Ref Range Status   Specimen Description WOUND RIGHT FOOT  Final   Special Requests NONE  Final   Gram Stain   Final    NO WBC SEEN NO ORGANISMS SEEN Performed at [facility], [place] [address], [place], [place] [address]    Culture   Final    RARE ESCHERICHIA COLI RARE KLEBSIELLA PNEUMONIAE NO ANAEROBES ISOLATED; CULTURE IN PROGRESS FOR 5 DAYS    Report  Status PENDING  Incomplete   Organism ID, Bacteria ESCHERICHIA COLI  Final   Organism ID, Bacteria KLEBSIELLA PNEUMONIAE  Final      Susceptibility   Escherichia coli - MIC*    AMPICILLIN 16 INTERMEDIATE Intermediate     CEFAZOLIN <=4 SENSITIVE Sensitive     CEFEPIME <=1 SENSITIVE Sensitive     CEFTAZIDIME <=1 SENSITIVE Sensitive     CEFTRIAXONE <=1 SENSITIVE Sensitive     CIPROFLOXACIN <=0.25 SENSITIVE Sensitive     GENTAMICIN <=1 SENSITIVE Sensitive     IMIPENEM <=0.25 SENSITIVE Sensitive     TRIMETH/SULFA <=20 SENSITIVE Sensitive     AMPICILLIN/SULBACTAM 4 SENSITIVE Sensitive     PIP/TAZO <=4 SENSITIVE Sensitive     Extended ESBL NEGATIVE Sensitive     * RARE ESCHERICHIA COLI   Klebsiella pneumoniae - MIC*    AMPICILLIN >=32 RESISTANT Resistant     CEFAZOLIN <=4 SENSITIVE Sensitive     CEFEPIME <=1 SENSITIVE Sensitive     CEFTAZIDIME <=1 SENSITIVE Sensitive     CEFTRIAXONE <=1 SENSITIVE Sensitive     CIPROFLOXACIN <=0.25 SENSITIVE Sensitive     GENTAMICIN <=1 SENSITIVE Sensitive     IMIPENEM <=0.25 SENSITIVE Sensitive     TRIMETH/SULFA <=20 SENSITIVE Sensitive     AMPICILLIN/SULBACTAM 4 SENSITIVE Sensitive     PIP/TAZO 8 SENSITIVE Sensitive     Extended ESBL NEGATIVE Sensitive     * RARE KLEBSIELLA PNEUMONIAE   Time coordinating discharge: Over 30 minutes  SIGNED:  Little Ishikawa, DO [facility] 05/30/2019, 11:21 AM Pager   If 7PM-7AM, please contact night-coverage [email or web address] Password [email or web address]

## 2019-05-30 NOTE — TOC Progression Note (Signed)
Transition of Care [facility]) - Progression Note    Patient Details  Name: [name] MRN: [id number] Date of Birth: April 12, 1966  [facility] CM/SW Contact  Loletha Grayer Beverely Pace, RN Phone Number: [phone] (working remotely) 05/30/2019, 5:15 PM  Clinical Narrative:   53 yr old gentleman s/p I & D of right foot abscess/ulcer with application of wound vac. Patient is active with [facility]. Case manager contacted Pollie Meyer with [facility] to confirm and to update on discharge plans. Case manager and bedside RN completed wound vac authorization form, Secretary faxed it to [employer] Fax: [phone]. Wound Vac requested for delivery on 8/11 AM. CM will follow.    Expected Discharge Plan: [place] Barriers to Discharge: No Barriers Identified  Expected Discharge Plan and Services Expected Discharge Plan: [place]   Discharge Planning Services: CM Consult Post Acute Care Choice: Durable Medical Equipment, Home Health Living arrangements for the past 2 months: Single Family Home Expected Discharge Date: 05/30/19               DME Arranged: Negative pressure wound device   Date DME Agency Contacted: 05/30/19 Time DME Agency Contacted: 87 Representative spoke with at DME Agency: Tommi Emery HH Arranged: RN [place] Agency: [facility] [place] Date [place]: 05/30/19 Time [place]: [place] Representative spoke with at [place]: [place] ([place]) Interventions    Readmission Risk Interventions No flowsheet data found.

## 2019-05-30 NOTE — Progress Notes (Signed)
Pt agreeable to finger stick CBG's he  Is concerned of going over his number of tests allowed per his monitoring sysem

## 2019-05-30 NOTE — Progress Notes (Signed)
Declined to use CHG wipes- states is "alergic to them, causes him to breakout."

## 2019-05-30 NOTE — Progress Notes (Signed)
Pt returned to room 5N08 after dialysis. Received report from Rodell Perna, RN in HD. Will continue to monitor.

## 2019-05-30 NOTE — Progress Notes (Signed)
Pain Rx sent to pharmacy.

## 2019-05-30 NOTE — Progress Notes (Signed)
[facility] Progress Note   Subjective: Seen on HD. Says he is losing weight, wanting to challenge EDW.    Objective Vitals:   05/29/19 1107 05/29/19 1815 05/29/19 1928 05/30/19 0356  BP: (!) 158/95 134/79 (!) 171/94 (!) 161/90  Pulse: 89 88 91 90  Resp:   16 16  Temp: 98.4 F (36.9 C) 97.6 F (36.4 C) 97.8 F (36.6 C) 97.8 F (36.6 C)  TempSrc: Oral Oral Oral Oral  SpO2: 100% 100% 100% 100%  Weight:      Height:       Physical Exam General: Pleasant, NAD Heart: S1,S2 RRR Lungs: CTAB A/P Abdomen: S, NT Extremities: No LE edema. Gauze drsg R foot with wound vac intact.  Dialysis Access: R AVF blood lines connected.     Additional Objective Labs: Basic Metabolic Panel: Recent Labs  Lab 05/23/19 1139 05/24/19 0722 05/25/19 1605 05/26/19 0434 05/27/19 0500  NA 134* 135 133* 133* 132*  K 4.7 4.3 4.2 3.7 3.5  CL 94* 92* 93* 91* 92*  CO2 23 25 23 25 22   GLUCOSE 144* 131* 132* 141* 134*  BUN 81* 42* 67* 34* 51*  CREATININE 14.52* 9.50* 12.43* 7.87* 11.07*  CALCIUM 8.9 8.9 8.9 8.8* 8.9  PHOS 7.9* 6.6* 7.7*  --   --    Liver Function Tests: Recent Labs  Lab 05/24/19 0722 05/25/19 1605 05/26/19 0434 05/27/19 0500  AST 75*  --  18 13*  ALT 92*  --  51* 36  ALKPHOS 330*  --  283* 236*  BILITOT 0.7  --  0.8 0.9  PROT 6.4*  --  6.6 6.5  ALBUMIN 2.1* 2.1* 2.0* 2.1*   No results for input(s): LIPASE, AMYLASE in the last 168 hours. CBC: Recent Labs  Lab 05/23/19 1139 05/24/19 0722 05/25/19 1606 05/26/19 0434 05/27/19 0500  WBC 16.4* 18.8* 11.3* 15.6* 13.0*  NEUTROABS 14.3* 16.4*  --  12.9* 10.3*  HGB 7.5* 7.9* 8.4* 7.7* 6.9*  HCT 23.3* 25.0* 26.8* 24.6* 22.5*  MCV 87.6 88.7 88.4 89.8 90.7  PLT 386 340 334 361 369   Blood Culture    Component Value Date/Time   SDES WOUND RIGHT FOOT 05/25/2019 0925   SPECREQUEST NONE 05/25/2019 0925   CULT  05/25/2019 0925    RARE ESCHERICHIA COLI RARE KLEBSIELLA PNEUMONIAE NO ANAEROBES ISOLATED;  CULTURE IN PROGRESS FOR 5 DAYS    REPTSTATUS PENDING 05/25/2019 [id number]    Cardiac Enzymes: No results for input(s): CKTOTAL, CKMB, CKMBINDEX, TROPONINI in the last 168 hours. CBG: Recent Labs  Lab 05/26/19 0003 05/27/19 1845 05/30/19 0002 05/30/19 0357 05/30/19 0657  GLUCAP 129* 114* 114* 87 105*   Iron Studies: No results for input(s): IRON, TIBC, TRANSFERRIN, FERRITIN in the last 72 hours. @lablastinr3 @ Studies/Results: No results found. Medications: . sodium chloride Stopped (05/29/19 0305)  . sodium chloride 10 mL/hr at 05/27/19 1714  .  ceFAZolin (ANCEF) IV    . metronidazole 500 mg (05/30/19 0210)   . sodium chloride   Intravenous Once  . Chlorhexidine Gluconate Cloth  6 each Topical Q0600  . insulin aspart  0-9 Units Subcutaneous Q4H  . lactobacillus acidophilus  2 tablet Oral TID  . sevelamer carbonate  2.4 g Oral TID WC     Dialysis Orders: MWF at [facility] 4hr, 450/800, EDW 98kg, 2K/2.25Ca, UFP #3, AVF, heparin 2000 bolus - Hectoral 84mcg IV q HD - Mircera 23mcg IV q 2 weeks (last 7/15)  Assessment/Plan: 1. R foot cellulitis:S/p second toe partial  amputation on 7/25, urgent I&D and second toe amputation on 8/3/2020and further debridement8/5.Further debridement and third toe amputated 8/7.Feeling well, denies pain.On IVcefazolin and  metronidazole. Per podiatry. 2. ESRD:HD MWF via AVF. K+ 3.5. HD today on schedule.  Hold heparin.No labs since 8/7- patient only allowing labs to be drawn on HD. Labs pending today.  3. Hypertension/volume:Not eating well in hospital, probably losing wt. BP high, now 1.3kg below EDW. Continue to challenge as tolerated with HD today. STANDING WT 98.8 kg UFG 2.5 liters. Lower EDW on DC.  4. Anemia:Hgb 12.0on admission-HGB 6.9on 8/7 and received2 units of PRBCs with HD on 8/7- refused labs this AM, says he only wants them to be drawn with dialysis.Continue ESA. 5. Metabolic bone [id number], corrected 10.4.  Will hold hectorol, follow calcium. Phos 5.5, switched to renvela powder during last admission- continue same.Phos 7.7. Consider increasing renvela dose if phos does not improve.Requests labs be limited to HD when possible. 6. Nutrition:Renal diet with fluid restriction. 7. T2DM: Insulin per primary 8. Elevated LFTs: LFTsinitiallytrending upnow improving. Per primary    Jimmye Norman.  NP-C 05/30/2019, 8:42 AM  [employer] [phone]

## 2019-05-30 NOTE — Progress Notes (Signed)
Pharmacy Antibiotic Note  [name] is a 53 y.o. male admitted on 05/22/2019 with cellulitis.   The patient has received several I&D and partial amputations this admission with podiatry plans to continue antibiotics for 4 weeks. Pharmacy is consulted for Cefazolin dosing along with Flagyl per MD.  The patient is ESRD-MWF, on schedule and receiving this AM. The patient is noted to have missed the Cefazolin dose on 8/7 due to going to the OR. SZP reported - scheduled to receive today on schedule. Appears stable despite missed dose.   Plan: - Continue Cefazolin 2g on MWF @ 2000 - Continue Flagyl 500 mg IV every 8 hours - Will continue to follow HD schedule/duration, culture results, LOT, and antibiotic de-escalation plans    Temp (24hrs), Avg:97.9 F (36.6 C), Min:97.6 F (36.4 C), Max:98.4 F (36.9 C)  Recent Labs  Lab 05/23/19 1139 05/24/19 0722 05/25/19 1605 05/25/19 1606 05/26/19 0434 05/27/19 0500 05/30/19 0837  WBC 16.4* 18.8*  --  11.3* 15.6* 13.0* 9.7  CREATININE 14.52* 9.50* 12.43*  --  7.87* 11.07*  --   VANCORANDOM  --  31  --   --   --   --   --     Estimated Creatinine Clearance: 9.1 mL/min (A) (by C-G formula based on SCr of 11.07 mg/dL (H)).    Allergies  Allergen Reactions  . Amoxicillin Itching    Did it involve swelling of the face/tongue/throat, SOB, or low BP? Unknown Did it involve sudden or severe rash/hives, skin peeling, or any reaction on the inside of your mouth or nose? Unknown Did you need to seek medical attention at a hospital or doctor's office? Unknown When did it last happen?20 years ago If all above answers are "NO", may proceed with cephalosporin use.   Marland Kitchen Hydromorphone Itching    Patient states he may take with benadryl. Makes feet itch.  Marland Kitchen Percocet [Oxycodone-Acetaminophen] Itching and Other (See Comments)    Legs only. Itching and burning feeling.   Keflex/Clinda PTA Vanc 8/3 >>  8/7 CTX 8/2 >> 8/6 Clinda 8/2 >> 8/3 Flagyl  8/3 >> Cefazolin 8/7 >>  Last admit:  7/17 WCx >> E.coli (pan-S) + Strep agalactiae 7/25 WCx (R-toe) >> E.coli, H influ (B-lactamase neg), B vulgatus (B-lactamase positive)  8/2 COVID >> neg 8/2 BCx >> ngtd 8/4 R-foot WCx (surgical) >> E.coli (pan-S except I-amp), K PNA (pan-S except R-amp),  Bacteroides vulgatus - B lactamase pos 8/5 R-foot WCx (surgical) >> E.coli (pan-S except I-amp)  Thank you for allowing pharmacy to be a part of this patient's care.  Alycia Rossetti, PharmD, BCPS Clinical Pharmacist Clinical phone for 05/30/2019: [phone] 05/30/2019 9:26 AM   **Pharmacist phone directory can now be found on [email or web address] (PW [email or web address]).  Listed under [place].

## 2019-05-30 NOTE — Plan of Care (Signed)

## 2019-05-30 NOTE — Progress Notes (Signed)
Subjective:  Patient ID: [name], male    DOB: [birth year],  MRN: [id number]  Seen bedside. Did not get wound VAC delivered today. Objective:   Vitals:   05/30/19 1212 05/30/19 1407  BP: (!) 158/98 (!) 124/93  Pulse: 95 97  Resp: 18 18  Temp: 98.4 F (36.9 C) 98.4 F (36.9 C)  SpO2: 99% 100%   General AA&O x3. Normal mood and affect.  Vascular Dorsalis pedis pulse palpable.  PT nonpalpable Right foot warm to touch.  Neurologic Epicritic sensation grossly diminished.  Dermatologic [place] intact. Minimal drainage in canister.  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   Results for orders placed or performed during the hospital encounter of 05/22/19 (from the past 24 hour(s))  Glucose, capillary     Status: Abnormal   Collection Time: 05/30/19 12:02 AM  Result Value Ref Range   Glucose-Capillary 114 (H) 70 - 99 mg/dL  Glucose, capillary     Status: None   Collection Time: 05/30/19  3:57 AM  Result Value Ref Range   Glucose-Capillary 87 70 - 99 mg/dL  Glucose, capillary     Status: Abnormal   Collection Time: 05/30/19  6:57 AM  Result Value Ref Range   Glucose-Capillary 105 (H) 70 - 99 mg/dL  CBC     Status: Abnormal   Collection Time: 05/30/19  8:37 AM  Result Value Ref Range   WBC 9.7 4.0 - 10.5 K/uL   RBC 2.89 (L) 4.22 - 5.81 MIL/uL   Hemoglobin 8.0 (L) 13.0 - 17.0 g/dL   HCT 25.5 (L) 39.0 - 52.0 %   MCV 88.2 80.0 - 100.0 fL   MCH 27.7 26.0 - 34.0 pg   MCHC 31.4 30.0 - 36.0 g/dL   RDW 16.9 (H) 11.5 - 15.5 %   Platelets 352 150 - 400 K/uL   nRBC 0.0 0.0 - 0.2 %  Comprehensive metabolic panel     Status: Abnormal   Collection Time: 05/30/19  8:37 AM  Result Value Ref Range   Sodium 136 135 - 145 mmol/L   Potassium 3.9 3.5 - 5.1 mmol/L   Chloride 97 (L) 98 - 111 mmol/L   CO2 20 (L) 22 - 32 mmol/L   Glucose, Bld 107 (H) 70 - 99 mg/dL   BUN 52 (H) 6 - 20 mg/dL   Creatinine, Ser 11.88 (H) 0.61 - 1.24 mg/dL   Calcium 8.9 8.9  - 10.3 mg/dL   Total Protein 6.4 (L) 6.5 - 8.1 g/dL   Albumin 2.2 (L) 3.5 - 5.0 g/dL   AST 24 15 - 41 U/L   ALT 12 0 - 44 U/L   Alkaline Phosphatase 221 (H) 38 - 126 U/L   Total Bilirubin 0.5 0.3 - 1.2 mg/dL   GFR calc non Af Amer 4 (L) >60 mL/min   GFR calc Af Amer 5 (L) >60 mL/min   Anion gap 19 (H) 5 - 15  Phosphorus     Status: Abnormal   Collection Time: 05/30/19  8:37 AM  Result Value Ref Range   Phosphorus 7.6 (H) 2.5 - 4.6 mg/dL  Glucose, capillary     Status: Abnormal   Collection Time: 05/30/19  5:02 PM  Result Value Ref Range   Glucose-Capillary 131 (H) 70 - 99 mg/dL   Results for orders placed or performed during the hospital encounter of 05/22/19  SARS Coronavirus 2 [facility] order, Performed in [facility] hospital lab)  Status: None   Collection Time: 05/22/19  8:48 PM  Result Value Ref Range Status   SARS Coronavirus 2 NEGATIVE NEGATIVE Final    Comment: (NOTE) If result is NEGATIVE SARS-CoV-2 target nucleic acids are NOT DETECTED. The SARS-CoV-2 RNA is generally detectable in upper and lower  respiratory specimens during the acute phase of infection. The lowest  concentration of SARS-CoV-2 viral copies this assay can detect is 250  copies / mL. A negative result does not preclude SARS-CoV-2 infection  and should not be used as the sole basis for treatment or other  patient management decisions.  A negative result may occur with  improper specimen collection / handling, submission of specimen other  than nasopharyngeal swab, presence of viral mutation(s) within the  areas targeted by this assay, and inadequate number of viral copies  (<250 copies / mL). A negative result must be combined with clinical  observations, patient history, and epidemiological information. If result is POSITIVE SARS-CoV-2 target nucleic acids are DETECTED. The SARS-CoV-2 RNA is generally detectable in upper and lower  respiratory specimens dur ing the acute phase of infection.   Positive  results are indicative of active infection with SARS-CoV-2.  Clinical  correlation with patient history and other diagnostic information is  necessary to determine patient infection status.  Positive results do  not rule out bacterial infection or co-infection with other viruses. If result is PRESUMPTIVE POSTIVE SARS-CoV-2 nucleic acids MAY BE PRESENT.   A presumptive positive result was obtained on the submitted specimen  and confirmed on repeat testing.  While 2019 novel coronavirus  (SARS-CoV-2) nucleic acids may be present in the submitted sample  additional confirmatory testing may be necessary for epidemiological  and / or clinical management purposes  to differentiate between  SARS-CoV-2 and other Sarbecovirus currently known to infect humans.  If clinically indicated additional testing with an alternate test  methodology [phone]) is advised. The SARS-CoV-2 RNA is generally  detectable in upper and lower respiratory sp ecimens during the acute  phase of infection. The expected result is Negative. Fact Sheet for Patients:  StrictlyIdeas.no Fact Sheet for Healthcare Providers: BankingDealers.co.za This test is not yet approved or cleared by the [place] FDA and has been authorized for detection and/or diagnosis of SARS-CoV-2 by FDA under an Emergency Use Authorization (EUA).  This EUA will remain in effect (meaning this test can be used) for the duration of the COVID-19 declaration under Section 564(b)(1) of the Act, 21 U.S.C. section 360bbb-3(b)(1), unless the authorization is terminated or revoked sooner. Performed at [facility], [place] [address], [place], [place] [address]   Aerobic/Anaerobic Culture (surgical/deep wound)     Status: None   Collection Time: 05/24/19 12:26 AM   Specimen: Wound; Abscess  Result Value Ref Range Status   Specimen Description WOUND RIGHT FOOT  Final   Special Requests ID A   Final   Gram Stain NO WBC SEEN RARE GRAM NEGATIVE RODS   Final   Culture   Final    FEW ESCHERICHIA COLI FEW KLEBSIELLA PNEUMONIAE MODERATE BACTEROIDES VULGATUS BETA LACTAMASE POSITIVE Performed at [facility], [place] [address], [place], [place] [address]    Report Status 05/29/2019 FINAL  Final   Organism ID, Bacteria ESCHERICHIA COLI  Final   Organism ID, Bacteria KLEBSIELLA PNEUMONIAE  Final      Susceptibility   Escherichia coli - MIC*    AMPICILLIN 16 INTERMEDIATE Intermediate     CEFAZOLIN <=4 SENSITIVE Sensitive  CEFEPIME <=1 SENSITIVE Sensitive     CEFTAZIDIME <=1 SENSITIVE Sensitive     CEFTRIAXONE <=1 SENSITIVE Sensitive     CIPROFLOXACIN <=0.25 SENSITIVE Sensitive     GENTAMICIN <=1 SENSITIVE Sensitive     IMIPENEM <=0.25 SENSITIVE Sensitive     TRIMETH/SULFA <=20 SENSITIVE Sensitive     AMPICILLIN/SULBACTAM 4 SENSITIVE Sensitive     PIP/TAZO <=4 SENSITIVE Sensitive     Extended ESBL NEGATIVE Sensitive     * FEW ESCHERICHIA COLI   Klebsiella pneumoniae - MIC*    AMPICILLIN >=32 RESISTANT Resistant     CEFAZOLIN <=4 SENSITIVE Sensitive     CEFEPIME <=1 SENSITIVE Sensitive     CEFTAZIDIME <=1 SENSITIVE Sensitive     CEFTRIAXONE <=1 SENSITIVE Sensitive     CIPROFLOXACIN <=0.25 SENSITIVE Sensitive     GENTAMICIN <=1 SENSITIVE Sensitive     IMIPENEM <=0.25 SENSITIVE Sensitive     TRIMETH/SULFA <=20 SENSITIVE Sensitive     AMPICILLIN/SULBACTAM 4 SENSITIVE Sensitive     PIP/TAZO 8 SENSITIVE Sensitive     Extended ESBL NEGATIVE Sensitive     * FEW KLEBSIELLA PNEUMONIAE  Culture, blood (Routine X 2) w Reflex to ID Panel     Status: None   Collection Time: 05/24/19  7:25 AM   Specimen: BLOOD LEFT HAND  Result Value Ref Range Status   Specimen Description BLOOD LEFT HAND  Final   Special Requests   Final    BOTTLES DRAWN AEROBIC ONLY Blood Culture results may not be optimal due to an inadequate volume of blood received in culture bottles   Culture   Final     NO GROWTH 5 DAYS Performed at [facility], [address], [place], [place] [address]    Report Status 05/29/2019 FINAL  Final  Culture, blood (Routine X 2) w Reflex to ID Panel     Status: None   Collection Time: 05/24/19  7:29 AM   Specimen: BLOOD LEFT HAND  Result Value Ref Range Status   Specimen Description BLOOD LEFT HAND  Final   Special Requests   Final    BOTTLES DRAWN AEROBIC ONLY Blood Culture results may not be optimal due to an inadequate volume of blood received in culture bottles   Culture   Final    NO GROWTH 5 DAYS Performed at [facility], [place] [address] [place], [place] [address]    Report Status 05/29/2019 FINAL  Final  Aerobic/Anaerobic Culture (surgical/deep wound)     Status: None   Collection Time: 05/25/19  9:25 AM   Specimen: Wound  Result Value Ref Range Status   Specimen Description WOUND RIGHT FOOT  Final   Special Requests NONE  Final   Gram Stain NO WBC SEEN NO ORGANISMS SEEN   Final   Culture   Final    RARE ESCHERICHIA COLI RARE KLEBSIELLA PNEUMONIAE NO ANAEROBES ISOLATED Performed at [facility], [place] [address], [place], [place] [address]    Report Status 05/30/2019 FINAL  Final   Organism ID, Bacteria ESCHERICHIA COLI  Final   Organism ID, Bacteria KLEBSIELLA PNEUMONIAE  Final      Susceptibility   Escherichia coli - MIC*    AMPICILLIN 16 INTERMEDIATE Intermediate     CEFAZOLIN <=4 SENSITIVE Sensitive     CEFEPIME <=1 SENSITIVE Sensitive     CEFTAZIDIME <=1 SENSITIVE Sensitive     CEFTRIAXONE <=1 SENSITIVE Sensitive     CIPROFLOXACIN <=0.25 SENSITIVE Sensitive     GENTAMICIN <=  1 SENSITIVE Sensitive     IMIPENEM <=0.25 SENSITIVE Sensitive     TRIMETH/SULFA <=20 SENSITIVE Sensitive     AMPICILLIN/SULBACTAM 4 SENSITIVE Sensitive     PIP/TAZO <=4 SENSITIVE Sensitive     Extended ESBL NEGATIVE Sensitive     * RARE ESCHERICHIA COLI   Klebsiella pneumoniae - MIC*    AMPICILLIN >=32 RESISTANT Resistant     CEFAZOLIN  <=4 SENSITIVE Sensitive     CEFEPIME <=1 SENSITIVE Sensitive     CEFTAZIDIME <=1 SENSITIVE Sensitive     CEFTRIAXONE <=1 SENSITIVE Sensitive     CIPROFLOXACIN <=0.25 SENSITIVE Sensitive     GENTAMICIN <=1 SENSITIVE Sensitive     IMIPENEM <=0.25 SENSITIVE Sensitive     TRIMETH/SULFA <=20 SENSITIVE Sensitive     AMPICILLIN/SULBACTAM 4 SENSITIVE Sensitive     PIP/TAZO 8 SENSITIVE Sensitive     Extended ESBL NEGATIVE Sensitive     * RARE KLEBSIELLA PNEUMONIAE    Assessment & Plan:  Patient was evaluated and treated and all questions answered.  Right Foot Cellulitis, abscess; complex right diabetic foot infection -Labs reviewed.  White blood cell count finally normalized -Recommend IV abx at discharge. Due to extensive infection and that he will be on extended wound VAC therapy with open wound would consider 4 weeks of IV Ancef with HD with PO flagyl given similar bioavailability. Patient has Dialysis at [facility] [address]. -Orders in for wound [facility] application with home health care.  To be changed Tuesdays and Thursdays with home health care. -Pending home wound VAC -Postop pain medication sent to patient's pharmacy.  Evelina Bucy, DPM  Accessible via secure chat for questions or concerns.

## 2019-05-31 DIAGNOSIS — M869 Osteomyelitis, unspecified: Secondary | ICD-10-CM | POA: Insufficient documentation

## 2019-05-31 LAB — GLUCOSE, CAPILLARY
Glucose-Capillary: 123 mg/dL — ABNORMAL HIGH (ref 70–99)
Glucose-Capillary: 105 mg/dL — ABNORMAL HIGH (ref 70–99)

## 2019-05-31 NOTE — Consult Note (Signed)
[place] Nurse wound consult note Consultation was completed by review of records, images and assistance from the bedside nurse/clinical staff.   Reason for Consult:change NPWT dressing to surgical wound on patient's foot Wound type:surgical  Pressure Injury POA: NA  Dressing procedure/placement/frequency: Discussed 4th toe with Dr. March Rummage via secure chat. Verified with CM that patient will receive home VAC unit today for DC. Discussed dressing change with bedside nurse, supplies in the room. She is comfortable with dressing change. Contact information given for [place] nurse should bedside nurse need to discuss further.  Requested CM make sure HHRN is aware of discoloration and skin changes on the 4th toe to avoid concern at the time of Dc.   [place], [place], [place]

## 2019-05-31 NOTE — Progress Notes (Signed)
PT Cancellation Note  Patient Details Name: [name] MRN: [id number] DOB: Nov 07, 1965   Cancelled Treatment:    Reason Eval/Treat Not Completed: Patient declined, no reason specified.  States he is fine, ready to go home.  Follow up at another time as pt will allow.   Ramond Dial 05/31/2019, 11:02 AM   Mee Hives, PT MS Acute Rehab Dept. Number: [place] and [place]

## 2019-05-31 NOTE — Progress Notes (Signed)
Physician Discharge Summary  AISEN CAPATI [id number] DOB: 02/15/1966 DOA: 05/22/2019  PCP: Hayden Rasmussen, MD  Admit date: 05/22/2019 Discharge date: 05/31/2019  Admitted From: Home Disposition: Home  Recommendations for Outpatient Follow-up:  1. Follow up with PCP in 1-2 weeks; podiatry as scheduled, dialysis Monday Wednesday Friday as scheduled 2. Please obtain BMP/CBC in one week  Equipment/Devices: Wound VAC  Discharge Condition: Guarded CODE STATUS: Full Diet recommendation: Renal, diabetic diet  Brief/Interim Summary: Dorothyann Peng a 53 y.o.malewith medical history significant ofdiet-controlled type 2 diabetes, hypertension, ESRD on HD, obesity, iron deficiency anemia presenting to the hospital as a direct admit by Dr. March Rummage from podiatry. Status post recent right foot second toe partial amputation for osteomyelitis on 7/25. He was discharged with a 2-week course of Keflex.ABIs revealed noncompressible right lower extremity arteries and noncompressible left lower extremity arteries. Left toe brachial index abnormal. Patient also underwent one-vessel posterior tibial artery runoff of bilateral foot. Patient was seen by Dr. March Rummage today and found to have temperature 101.6 F. Surgical site with delayed healing but without frank purulence. He had erythema and swelling of the right lower extremity. Given signs of infection he was sent to the hospital for IV antibiotics and possible surgical debridement. Patient states he has been having low-grade fevers since he left the hospital. Last night his temperature went up to 101 F range. He started noticing erythema and swelling extending up from his foot to his ankle. He is having some discomfort in his foot from wearing an Haematologist. States he has not been getting Keflex at dialysis as planned. States 2 days ago Dr. March Rummage switched him to clindamycin and he has been taking this medication. Denies any shortness of breath, cough,  nausea, vomiting, or abdominal pain.  Patient's foot is improved per the patient but he is having significant amount of gas due to his lactose intolerance.  Patient was dialyzed today and likely will undergo surgical intervention.  Will defer further imaging studies to podiatry and may consider an MRI of the foot if patient does not improve significantly.  Podiatry evaluated and Dr. March Rummage felt that the patient had an abscess so he underwent I&D w/ second toe amputation on 8/3 and subsequent debridement/irrigation on 8/5 with improved swelling and pain in the leg.  Patient's cultures were positive for E. coli and Klebsiella, sensitive to cefazolin and will be continued on this regiment Monday Wednesday Friday for a minimum of 4 weeks, podiatry requesting an additional 4 weeks of flagyl coverage as well - podiatry/nephro/PCP will continue to follow in the outpatient setting, may be necessary to prolong patient's antibiotic duration pending clinical improvement and wound evaluation in the future.  Patient otherwise stable and agreeable for discharge home.  Patient has wound VAC provided by podiatry will be followed in their clinic.  Patient remains medically stable for discharge, pending equipment delivery.   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 Amoxicillin Itching    Did it involve swelling of the face/tongue/throat, SOB, or low BP? Unknown Did it involve sudden or severe rash/hives, skin peeling, or any reaction on the inside of your mouth or nose? Unknown Did you need to seek medical attention at a hospital or doctor's office? Unknown When did it last happen?20 years ago If all above answers are "NO", may proceed with cephalosporin use.   Marland Kitchen Hydromorphone Itching    Patient states he may take with benadryl. Makes feet itch.  Marland Kitchen Percocet [Oxycodone-Acetaminophen] Itching and Other (See Comments)    Legs only. Itching and burning feeling.    Consultations:  Podiatry, Nephrology   Procedures/Studies: Dg Ankle Complete Right  Result Date: 05/09/2019 Please see detailed radiograph report in office note.  Dg Foot 2 Views Right  Result Date: 05/24/2019 CLINICAL DATA:  Cellulitis.  Abscess.  Second toe amputation. EXAM: RIGHT FOOT - 2 VIEW COMPARISON:  05/22/2019.  05/14/2019. FINDINGS: Right second digit amputation. Distal right second metatarsal head intact. Overlying gauze present. Deformity of the distal phalanx of the right great toe unchanged. Diffuse osteopenia and degenerative change. No radiopaque foreign body. IMPRESSION: 1. Right second digit amputation. Distal right second metatarsal head is intact. 2. Deformity of the distal phalanx of the right great toe again noted. Fracture and/or osteomyelitis of the distal phalanx of the right great could present in this fashion. 3.  No radiopaque foreign body. Electronically Signed   By: Marcello Moores  Register   On: 05/24/2019 07:52   Dg Foot 2 Views Right  Result Date: 05/22/2019 CLINICAL DATA:  Diabetic ulcer of right foot EXAM: RIGHT FOOT - 2 VIEW COMPARISON:  05/14/2019 FINDINGS: Prior amputation of the right  2nd toe at the PIP joint. No acute bony abnormality. Specifically, no fracture, subluxation, or dislocation. No bone destruction to suggest osteomyelitis. IMPRESSION: Prior right toe amputation.  No acute bony abnormality. Electronically Signed   By: Rolm Baptise M.D.   On: 05/22/2019 23:20   Dg Foot 2 Views Right  Result Date: 05/14/2019 CLINICAL DATA:  S/p partial amputation of the 2nd toe. EXAM: RIGHT FOOT - 2 VIEW COMPARISON:  05/06/2011 FINDINGS: Amputation of the 2nd digit from the PIP noted. Irregularity of the great toe distal phalanx is unchanged. No other significant abnormalities identified. 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 Electronically Signed   By: Margarette Canada M.D.   On: 05/14/2019 12:23   Dg Foot 2 Views Right  Result Date: 05/09/2019 Please see detailed radiograph report in office note.  Vas [place] Burnard Bunting With/wo Tbi  Result Date: 05/15/2019 LOWER EXTREMITY DOPPLER STUDY Indications: Ulceration, gangrene, and Osteomyelitis. Status post toe amputation              and right hallux bone biopsy 05/14/19 High Risk Factors: Hypertension, hyperlipidemia, Diabetes. Other Factors: ESRD on dialysis.  Limitations: Bandages, surgery 05/14/19 Comparison Study: No prior study on file for comparison. Performing Technologist: Sharion Dove RVS  Examination Guidelines: A complete evaluation includes at minimum, Doppler waveform signals and systolic blood pressure reading at the level  of bilateral brachial, anterior tibial, and posterior tibial arteries, when vessel segments are accessible. Bilateral testing is considered an integral part of a complete examination. Photoelectric Plethysmograph (PPG) waveforms and toe systolic pressure readings are included as required and additional duplex testing as needed. Limited examinations for reoccurring indications may be performed as noted.  ABI Findings: +---------+------------------+-----+--------+---------------+ Right    Rt Pressure  (mmHg)IndexWaveformComment         +---------+------------------+-----+--------+---------------+ Brachial                                Dialysis access +---------+------------------+-----+--------+---------------+ PTA      255               1.42 biphasic                +---------+------------------+-----+--------+---------------+ DP                                      bandages        +---------+------------------+-----+--------+---------------+ Great Toe                               bandages        +---------+------------------+-----+--------+---------------+ +---------+------------------+-----+----------+-------+ Left     Lt Pressure (mmHg)IndexWaveform  Comment +---------+------------------+-----+----------+-------+ Brachial 180                    triphasic         +---------+------------------+-----+----------+-------+ PTA      254               1.41 biphasic          +---------+------------------+-----+----------+-------+ DP       119               0.66 monophasic        +---------+------------------+-----+----------+-------+ Great Toe86                0.48                   +---------+------------------+-----+----------+-------+ +-------+-----------+-----------+------------+------------+ ABI/TBIToday's ABIToday's TBIPrevious ABIPrevious TBI +-------+-----------+-----------+------------+------------+ Right  1.4        bandages                            +-------+-----------+-----------+------------+------------+ Left   1.4        0.48                                +-------+-----------+-----------+------------+------------+ Arterial wall calcification precludes accurate ankle pressures and ABIs. No prior study on file for comparison  Summary: Right: Resting right ankle-brachial index indicates noncompressible right lower extremity arteries. Left: Resting left ankle-brachial index indicates noncompressible left lower extremity  arteries.The left toe-brachial index is abnormal.  *See table(s) above for measurements and observations.  Electronically signed by Deitra Mayo MD on 05/15/2019 at 7:07:21 PM.    Final    Vas [place] Lower Extremity Arterial Duplex  Result Date: 05/16/2019 LOWER EXTREMITY ARTERIAL DUPLEX STUDY Indications: Ulceration, gangrene, and Osteomyelitis. Status post toe amputation              and right hallux bone biopsy 05/14/19. High Risk Factors: Hypertension, hyperlipidemia, Diabetes.  Current ABI: 05/15/19 non compressible  Comparison Study: no prior Performing Technologist: June Leap RDMS, RVT  Examination Guidelines: A complete evaluation includes B-mode imaging, spectral Doppler, color Doppler, and power Doppler as needed of all accessible portions of each vessel. Bilateral testing is considered an integral part of a complete examination. Limited examinations for reoccurring indications may be performed as noted.  +-----------+--------+-----+--------+----------+-------------------------------+ RIGHT      PSV cm/sRatioStenosisWaveform  Comments                        +-----------+--------+-----+--------+----------+-------------------------------+ CFA Prox   100                  triphasic mild heterogenous plaque        +-----------+--------+-----+--------+----------+-------------------------------+ DFA        78                   triphasic                                 +-----------+--------+-----+--------+----------+-------------------------------+ SFA Prox   125                  triphasic                                 +-----------+--------+-----+--------+----------+-------------------------------+ SFA Mid    125                  triphasic                                 +-----------+--------+-----+--------+----------+-------------------------------+ SFA Distal 140                  triphasic calcified plaque with shadowing  +-----------+--------+-----+--------+----------+-------------------------------+ POP Prox   112                  triphasic mild heterogenous plaque        +-----------+--------+-----+--------+----------+-------------------------------+ POP Distal 84                   biphasic                                  +-----------+--------+-----+--------+----------+-------------------------------+ ATA Distal 19                   biphasic  calcification of vessel walls   +-----------+--------+-----+--------+----------+-------------------------------+ PTA Distal 90                   monophasiccalcification of vessel walls   +-----------+--------+-----+--------+----------+-------------------------------+ PERO Distal                     absent    calcification of vessel walls   +-----------+--------+-----+--------+----------+-------------------------------+  Summary: Right: Calcification of pedal arteries with dampened flow. Absent peroneal artery flow. No focal stenosis or occlusion noted.  See table(s) above for measurements and observations. Electronically signed by Ruta Hinds MD on 05/16/2019 at 6:00:07 PM.    Final    Vas [place] Lower Extremity Venous (dvt) (only [place])  Result Date: 05/14/2019  Lower Venous Study Indications: Pain, Swelling, and infection of foot.  Limitations: Edema, shadowing, and leg elevation. Comparison Study: No prior study on file for comparison. Performing Technologist: Sharion Dove RVS  Examination Guidelines: A complete evaluation includes B-mode imaging, spectral Doppler, color Doppler, and power Doppler as needed of all accessible portions of each vessel. Bilateral testing is considered an integral part of a complete examination. Limited examinations for reoccurring indications may be performed as noted.  +---------+---------------+---------+-----------+----------+-------------------+ RIGHT    CompressibilityPhasicitySpontaneityPropertiesSummary              +---------+---------------+---------+-----------+----------+-------------------+ CFV      Full           Yes      Yes                                      +---------+---------------+---------+-----------+----------+-------------------+ SFJ      Full                                                             +---------+---------------+---------+-----------+----------+-------------------+ FV Prox  Full                                                             +---------+---------------+---------+-----------+----------+-------------------+ FV Mid   Full                                                             +---------+---------------+---------+-----------+----------+-------------------+ FV DistalFull                                                             +---------+---------------+---------+-----------+----------+-------------------+ PFV      Full                                                             +---------+---------------+---------+-----------+----------+-------------------+ POP      Full           Yes      Yes                                      +---------+---------------+---------+-----------+----------+-------------------+ PTV                                                   visualized by color +---------+---------------+---------+-----------+----------+-------------------+ PERO  visualized by color +---------+---------------+---------+-----------+----------+-------------------+   +----+---------------+---------+-----------+----------+-------+ LEFTCompressibilityPhasicitySpontaneityPropertiesSummary +----+---------------+---------+-----------+----------+-------+ CFV Full           Yes      Yes                          +----+---------------+---------+-----------+----------+-------+     Summary: Right: There is no evidence of deep vein thrombosis in the lower  extremity. However, portions of this examination were limited- see technologist comments above. Left: No evidence of common femoral vein obstruction.  *See table(s) above for measurements and observations. Electronically signed by Deitra Mayo MD on 05/14/2019 at 9:54:30 AM.    Final    US Abdomen Limited Ruq  Result Date: 05/25/2019 CLINICAL DATA:  Abnormal LFTs. EXAM: ULTRASOUND ABDOMEN LIMITED RIGHT UPPER QUADRANT COMPARISON:  Ultrasound 10/06/2013. FINDINGS: Gallbladder: Multiple gallstones measuring up to 1.1 cm. Gallbladder wall thickness 2.6 mm. Negative Murphy sign. Common bile duct: Diameter: 4.1 mm Liver: No focal lesion identified. Within normal limits in parenchymal echogenicity. Portal vein is patent on color Doppler imaging with normal direction of blood flow towards the liver. Other: Trace ascites. IMPRESSION: 1. Multiple gallstones. Gallbladder wall thickness 2.6 mm. Negative Murphy sign. No biliary distention. 2. Trace ascites. Electronically Signed   By: Marcello Moores  Register   On: 05/25/2019 08:44    Subjective: No acute issues or events overnight, tolerated bandage change and wound VAC placement quite well, tolerating dialysis now without issue.  Otherwise stable and agreeable for discharge home.   Discharge Exam: Vitals:   05/31/19 0437 05/31/19 0745  BP: (!) 153/84 (!) 162/87  Pulse: 92 91  Resp: 16 16  Temp: 97.8 F (36.6 C) (!) 97.4 F (36.3 C)  SpO2: 99% 100%   Vitals:   05/30/19 1407 05/30/19 2122 05/31/19 0437 05/31/19 0745  BP: (!) 124/93 (!) 155/81 (!) 153/84 (!) 162/87  Pulse: 97 97 92 91  Resp: 18 16 16 16   Temp: 98.4 F (36.9 C) 98.7 F (37.1 C) 97.8 F (36.6 C) (!) 97.4 F (36.3 C)  TempSrc: Oral Oral Oral Oral  SpO2: 100% 100% 99% 100%  Weight:      Height:       Physical Exam Constitution:  Alert, cooperative, no distress,  Psychiatric: Normal and stable mood and affect, cognition intact,   HEENT: Normocephalic, PERRL, otherwise with in  Normal limits  Chest:Chest symmetric Cardio vascular: S1/S2, RRR, No murmure, No Rubs or Gallops  pulmonary: Clear to auscultation bilaterally, respirations unlabored, negative wheezes / crackles Abdomen: Soft, non-tender, non-distended, bowel sounds,no masses, no organomegaly, negative murphy's sign Muscular skeletal:Limited exam - in bed, able to move all 4 extremities, Normal strength,  Neuro: CNII-XII intact. , normal motor and sensation, reflexes intact  Musculoskeletal: No changes, wound vac/dressing still in place right foot. Right upper arm AV fistula currently accessed for dialysis Skin: Dry warm to touch, right foot/second toe surgical wound, wrapped -wound vac intact   The results of significant diagnostics from this hospitalization (including imaging, microbiology, ancillary and laboratory) are listed below for reference.     Microbiology: Recent Results (from the past 240 hour(s))  SARS Coronavirus 2 [facility] order, Performed in [place] hospital lab)     Status: None   Collection Time: 05/22/19  8:48 PM  Result Value Ref Range Status   SARS Coronavirus 2 NEGATIVE NEGATIVE Final    Comment: (NOTE) If result is NEGATIVE SARS-CoV-2 target nucleic acids are NOT DETECTED. The SARS-CoV-2 RNA is generally detectable in  upper and lower  respiratory specimens during the acute phase of infection. The lowest  concentration of SARS-CoV-2 viral copies this assay can detect is 250  copies / mL. A negative result does not preclude SARS-CoV-2 infection  and should not be used as the sole basis for treatment or other  patient management decisions.  A negative result may occur with  improper specimen collection / handling, submission of specimen other  than nasopharyngeal swab, presence of viral mutation(s) within the  areas targeted by this assay, and inadequate number of viral copies  (<250 copies / mL). A negative result must be combined with clinical  observations, patient  history, and epidemiological information. If result is POSITIVE SARS-CoV-2 target nucleic acids are DETECTED. The SARS-CoV-2 RNA is generally detectable in upper and lower  respiratory specimens dur ing the acute phase of infection.  Positive  results are indicative of active infection with SARS-CoV-2.  Clinical  correlation with patient history and other diagnostic information is  necessary to determine patient infection status.  Positive results do  not rule out bacterial infection or co-infection with other viruses. If result is PRESUMPTIVE POSTIVE SARS-CoV-2 nucleic acids MAY BE PRESENT.   A presumptive positive result was obtained on the submitted specimen  and confirmed on repeat testing.  While 2019 novel coronavirus  (SARS-CoV-2) nucleic acids may be present in the submitted sample  additional confirmatory testing may be necessary for epidemiological  and / or clinical management purposes  to differentiate between  SARS-CoV-2 and other Sarbecovirus currently known to infect humans.  If clinically indicated additional testing with an alternate test  methodology [phone]) is advised. The SARS-CoV-2 RNA is generally  detectable in upper and lower respiratory sp ecimens during the acute  phase of infection. The expected result is Negative. Fact Sheet for Patients:  StrictlyIdeas.no Fact Sheet for Healthcare Providers: BankingDealers.co.za This test is not yet approved or cleared by the [place] FDA and has been authorized for detection and/or diagnosis of SARS-CoV-2 by FDA under an Emergency Use Authorization (EUA).  This EUA will remain in effect (meaning this test can be used) for the duration of the COVID-19 declaration under Section 564(b)(1) of the Act, 21 U.S.C. section 360bbb-3(b)(1), unless the authorization is terminated or revoked sooner. Performed at [facility], [place] [address] [place], [place] [address]    Aerobic/Anaerobic Culture (surgical/deep wound)     Status: None   Collection Time: 05/24/19 12:26 AM   Specimen: Wound; Abscess  Result Value Ref Range Status   Specimen Description WOUND RIGHT FOOT  Final   Special Requests ID A  Final   Gram Stain NO WBC SEEN RARE GRAM NEGATIVE RODS   Final   Culture   Final    FEW ESCHERICHIA COLI FEW KLEBSIELLA PNEUMONIAE MODERATE BACTEROIDES VULGATUS BETA LACTAMASE POSITIVE Performed at [facility], [place] [address], [place], [place] [address]    Report Status 05/29/2019 FINAL  Final   Organism ID, Bacteria ESCHERICHIA COLI  Final   Organism ID, Bacteria KLEBSIELLA PNEUMONIAE  Final      Susceptibility   Escherichia coli - MIC*    AMPICILLIN 16 INTERMEDIATE Intermediate     CEFAZOLIN <=4 SENSITIVE Sensitive     CEFEPIME <=1 SENSITIVE Sensitive     CEFTAZIDIME <=1 SENSITIVE Sensitive     CEFTRIAXONE <=1 SENSITIVE Sensitive     CIPROFLOXACIN <=0.25 SENSITIVE Sensitive     GENTAMICIN <=1 SENSITIVE Sensitive     IMIPENEM <=0.25 SENSITIVE Sensitive  TRIMETH/SULFA <=20 SENSITIVE Sensitive     AMPICILLIN/SULBACTAM 4 SENSITIVE Sensitive     PIP/TAZO <=4 SENSITIVE Sensitive     Extended ESBL NEGATIVE Sensitive     * FEW ESCHERICHIA COLI   Klebsiella pneumoniae - MIC*    AMPICILLIN >=32 RESISTANT Resistant     CEFAZOLIN <=4 SENSITIVE Sensitive     CEFEPIME <=1 SENSITIVE Sensitive     CEFTAZIDIME <=1 SENSITIVE Sensitive     CEFTRIAXONE <=1 SENSITIVE Sensitive     CIPROFLOXACIN <=0.25 SENSITIVE Sensitive     GENTAMICIN <=1 SENSITIVE Sensitive     IMIPENEM <=0.25 SENSITIVE Sensitive     TRIMETH/SULFA <=20 SENSITIVE Sensitive     AMPICILLIN/SULBACTAM 4 SENSITIVE Sensitive     PIP/TAZO 8 SENSITIVE Sensitive     Extended ESBL NEGATIVE Sensitive     * FEW KLEBSIELLA PNEUMONIAE  Culture, blood (Routine X 2) w Reflex to ID Panel     Status: None   Collection Time: 05/24/19  7:25 AM   Specimen: BLOOD LEFT HAND  Result Value Ref Range  Status   Specimen Description BLOOD LEFT HAND  Final   Special Requests   Final    BOTTLES DRAWN AEROBIC ONLY Blood Culture results may not be optimal due to an inadequate volume of blood received in culture bottles   Culture   Final    NO GROWTH 5 DAYS Performed at [facility], [place] [address] [place], [place] [address]    Report Status 05/29/2019 FINAL  Final  Culture, blood (Routine X 2) w Reflex to ID Panel     Status: None   Collection Time: 05/24/19  7:29 AM   Specimen: BLOOD LEFT HAND  Result Value Ref Range Status   Specimen Description BLOOD LEFT HAND  Final   Special Requests   Final    BOTTLES DRAWN AEROBIC ONLY Blood Culture results may not be optimal due to an inadequate volume of blood received in culture bottles   Culture   Final    NO GROWTH 5 DAYS Performed at [facility], [place] [address] [place], [place] [address]    Report Status 05/29/2019 FINAL  Final  Aerobic/Anaerobic Culture (surgical/deep wound)     Status: None   Collection Time: 05/25/19  9:25 AM   Specimen: Wound  Result Value Ref Range Status   Specimen Description WOUND RIGHT FOOT  Final   Special Requests NONE  Final   Gram Stain NO WBC SEEN NO ORGANISMS SEEN   Final   Culture   Final    RARE ESCHERICHIA COLI RARE KLEBSIELLA PNEUMONIAE NO ANAEROBES ISOLATED Performed at [facility], [place] [address] [place], [place] [address]    Report Status 05/30/2019 FINAL  Final   Organism ID, Bacteria ESCHERICHIA COLI  Final   Organism ID, Bacteria KLEBSIELLA PNEUMONIAE  Final      Susceptibility   Escherichia coli - MIC*    AMPICILLIN 16 INTERMEDIATE Intermediate     CEFAZOLIN <=4 SENSITIVE Sensitive     CEFEPIME <=1 SENSITIVE Sensitive     CEFTAZIDIME <=1 SENSITIVE Sensitive     CEFTRIAXONE <=1 SENSITIVE Sensitive     CIPROFLOXACIN <=0.25 SENSITIVE Sensitive     GENTAMICIN <=1 SENSITIVE Sensitive     IMIPENEM <=0.25 SENSITIVE Sensitive     TRIMETH/SULFA <=20 SENSITIVE Sensitive      AMPICILLIN/SULBACTAM 4 SENSITIVE Sensitive     PIP/TAZO <=4 SENSITIVE Sensitive     Extended ESBL NEGATIVE Sensitive     *  RARE ESCHERICHIA COLI   Klebsiella pneumoniae - MIC*    AMPICILLIN >=32 RESISTANT Resistant     CEFAZOLIN <=4 SENSITIVE Sensitive     CEFEPIME <=1 SENSITIVE Sensitive     CEFTAZIDIME <=1 SENSITIVE Sensitive     CEFTRIAXONE <=1 SENSITIVE Sensitive     CIPROFLOXACIN <=0.25 SENSITIVE Sensitive     GENTAMICIN <=1 SENSITIVE Sensitive     IMIPENEM <=0.25 SENSITIVE Sensitive     TRIMETH/SULFA <=20 SENSITIVE Sensitive     AMPICILLIN/SULBACTAM 4 SENSITIVE Sensitive     PIP/TAZO 8 SENSITIVE Sensitive     Extended ESBL NEGATIVE Sensitive     * RARE KLEBSIELLA PNEUMONIAE     Labs: BNP (last 3 results) No results for input(s): BNP in the last 8760 hours. Basic Metabolic Panel: Recent Labs  Lab 05/25/19 1605 05/26/19 0434 05/27/19 0500 05/30/19 0837  NA 133* 133* 132* 136  K 4.2 3.7 3.5 3.9  CL 93* 91* 92* 97*  CO2 23 25 22  20*  GLUCOSE 132* 141* 134* 107*  BUN 67* 34* 51* 52*  CREATININE 12.43* 7.87* 11.07* 11.88*  CALCIUM 8.9 8.8* 8.9 8.9  MG 2.5*  --   --   --   PHOS 7.7*  --   --  7.6*   Liver Function Tests: Recent Labs  Lab 05/25/19 1605 05/26/19 0434 05/27/19 0500 05/30/19 0837  AST  --  18 13* 24  ALT  --  51* 36 12  ALKPHOS  --  283* 236* 221*  BILITOT  --  0.8 0.9 0.5  PROT  --  6.6 6.5 6.4*  ALBUMIN 2.1* 2.0* 2.1* 2.2*   No results for input(s): LIPASE, AMYLASE in the last 168 hours. No results for input(s): AMMONIA in the last 168 hours. CBC: Recent Labs  Lab 05/25/19 1606 05/26/19 0434 05/27/19 0500 05/30/19 0837  WBC 11.3* 15.6* 13.0* 9.7  NEUTROABS  --  12.9* 10.3*  --   HGB 8.4* 7.7* 6.9* 8.0*  HCT 26.8* 24.6* 22.5* 25.5*  MCV 88.4 89.8 90.7 88.2  PLT 334 361 369 352   Cardiac Enzymes: No results for input(s): CKTOTAL, CKMB, CKMBINDEX, TROPONINI in the last 168 hours. BNP: Invalid input(s): POCBNP CBG: Recent Labs   Lab 05/30/19 0357 05/30/19 0657 05/30/19 1702 05/30/19 2122 05/31/19 0741  GLUCAP 87 105* 131* 197* 123*   Urinalysis    Component Value Date/Time   COLORURINE YELLOW 06/06/2011 2300   APPEARANCEUR CLOUDY (A) 06/06/2011 2300   LABSPEC 1.021 06/06/2011 2300   PHURINE 5.0 06/06/2011 2300   GLUCOSEU NEGATIVE 06/06/2011 2300   GLUCOSEU > or = 1000 mg/dL (AA) 05/16/2008 1215   HGBUR SMALL (A) 06/06/2011 2300   HGBUR moderate 11/01/2007 0931   BILIRUBINUR NEGATIVE 06/06/2011 2300   KETONESUR NEGATIVE 06/06/2011 2300   PROTEINUR >300 (A) 06/06/2011 2300   UROBILINOGEN 0.2 06/06/2011 2300   NITRITE NEGATIVE 06/06/2011 2300   LEUKOCYTESUR SMALL (A) 06/06/2011 2300   Sepsis Labs Invalid input(s): PROCALCITONIN,  WBC,  LACTICIDVEN Microbiology Recent Results (from the past 240 hour(s))  SARS Coronavirus 2 [facility] order, Performed in [place] hospital lab)     Status: None   Collection Time: 05/22/19  8:48 PM  Result Value Ref Range Status   SARS Coronavirus 2 NEGATIVE NEGATIVE Final    Comment: (NOTE) If result is NEGATIVE SARS-CoV-2 target nucleic acids are NOT DETECTED. The SARS-CoV-2 RNA is generally detectable in upper and lower  respiratory specimens during the acute phase of infection. The lowest  concentration of  SARS-CoV-2 viral copies this assay can detect is 250  copies / mL. A negative result does not preclude SARS-CoV-2 infection  and should not be used as the sole basis for treatment or other  patient management decisions.  A negative result may occur with  improper specimen collection / handling, submission of specimen other  than nasopharyngeal swab, presence of viral mutation(s) within the  areas targeted by this assay, and inadequate number of viral copies  (<250 copies / mL). A negative result must be combined with clinical  observations, patient history, and epidemiological information. If result is POSITIVE SARS-CoV-2 target nucleic acids are  DETECTED. The SARS-CoV-2 RNA is generally detectable in upper and lower  respiratory specimens dur ing the acute phase of infection.  Positive  results are indicative of active infection with SARS-CoV-2.  Clinical  correlation with patient history and other diagnostic information is  necessary to determine patient infection status.  Positive results do  not rule out bacterial infection or co-infection with other viruses. If result is PRESUMPTIVE POSTIVE SARS-CoV-2 nucleic acids MAY BE PRESENT.   A presumptive positive result was obtained on the submitted specimen  and confirmed on repeat testing.  While 2019 novel coronavirus  (SARS-CoV-2) nucleic acids may be present in the submitted sample  additional confirmatory testing may be necessary for epidemiological  and / or clinical management purposes  to differentiate between  SARS-CoV-2 and other Sarbecovirus currently known to infect humans.  If clinically indicated additional testing with an alternate test  methodology [phone]) is advised. The SARS-CoV-2 RNA is generally  detectable in upper and lower respiratory sp ecimens during the acute  phase of infection. The expected result is Negative. Fact Sheet for Patients:  StrictlyIdeas.no Fact Sheet for Healthcare Providers: BankingDealers.co.za This test is not yet approved or cleared by the [place] FDA and has been authorized for detection and/or diagnosis of SARS-CoV-2 by FDA under an Emergency Use Authorization (EUA).  This EUA will remain in effect (meaning this test can be used) for the duration of the COVID-19 declaration under Section 564(b)(1) of the Act, 21 U.S.C. section 360bbb-3(b)(1), unless the authorization is terminated or revoked sooner. Performed at [facility], [place] [address] [place], [place] [address]   Aerobic/Anaerobic Culture (surgical/deep wound)     Status: None   Collection Time: 05/24/19 12:26 AM    Specimen: Wound; Abscess  Result Value Ref Range Status   Specimen Description WOUND RIGHT FOOT  Final   Special Requests ID A  Final   Gram Stain NO WBC SEEN RARE GRAM NEGATIVE RODS   Final   Culture   Final    FEW ESCHERICHIA COLI FEW KLEBSIELLA PNEUMONIAE MODERATE BACTEROIDES VULGATUS BETA LACTAMASE POSITIVE Performed at [facility], [place] [address] [place], [place] [address]    Report Status 05/29/2019 FINAL  Final   Organism ID, Bacteria ESCHERICHIA COLI  Final   Organism ID, Bacteria KLEBSIELLA PNEUMONIAE  Final      Susceptibility   Escherichia coli - MIC*    AMPICILLIN 16 INTERMEDIATE Intermediate     CEFAZOLIN <=4 SENSITIVE Sensitive     CEFEPIME <=1 SENSITIVE Sensitive     CEFTAZIDIME <=1 SENSITIVE Sensitive     CEFTRIAXONE <=1 SENSITIVE Sensitive     CIPROFLOXACIN <=0.25 SENSITIVE Sensitive     GENTAMICIN <=1 SENSITIVE Sensitive     IMIPENEM <=0.25 SENSITIVE Sensitive     TRIMETH/SULFA <=20 SENSITIVE Sensitive     AMPICILLIN/SULBACTAM 4 SENSITIVE Sensitive  PIP/TAZO <=4 SENSITIVE Sensitive     Extended ESBL NEGATIVE Sensitive     * FEW ESCHERICHIA COLI   Klebsiella pneumoniae - MIC*    AMPICILLIN >=32 RESISTANT Resistant     CEFAZOLIN <=4 SENSITIVE Sensitive     CEFEPIME <=1 SENSITIVE Sensitive     CEFTAZIDIME <=1 SENSITIVE Sensitive     CEFTRIAXONE <=1 SENSITIVE Sensitive     CIPROFLOXACIN <=0.25 SENSITIVE Sensitive     GENTAMICIN <=1 SENSITIVE Sensitive     IMIPENEM <=0.25 SENSITIVE Sensitive     TRIMETH/SULFA <=20 SENSITIVE Sensitive     AMPICILLIN/SULBACTAM 4 SENSITIVE Sensitive     PIP/TAZO 8 SENSITIVE Sensitive     Extended ESBL NEGATIVE Sensitive     * FEW KLEBSIELLA PNEUMONIAE  Culture, blood (Routine X 2) w Reflex to ID Panel     Status: None   Collection Time: 05/24/19  7:25 AM   Specimen: BLOOD LEFT HAND  Result Value Ref Range Status   Specimen Description BLOOD LEFT HAND  Final   Special Requests   Final    BOTTLES DRAWN AEROBIC  ONLY Blood Culture results may not be optimal due to an inadequate volume of blood received in culture bottles   Culture   Final    NO GROWTH 5 DAYS Performed at [facility], [place] [address], [place], [place] [address]    Report Status 05/29/2019 FINAL  Final  Culture, blood (Routine X 2) w Reflex to ID Panel     Status: None   Collection Time: 05/24/19  7:29 AM   Specimen: BLOOD LEFT HAND  Result Value Ref Range Status   Specimen Description BLOOD LEFT HAND  Final   Special Requests   Final    BOTTLES DRAWN AEROBIC ONLY Blood Culture results may not be optimal due to an inadequate volume of blood received in culture bottles   Culture   Final    NO GROWTH 5 DAYS Performed at [facility], [place] [address], [place], [place] [address]    Report Status 05/29/2019 FINAL  Final  Aerobic/Anaerobic Culture (surgical/deep wound)     Status: None   Collection Time: 05/25/19  9:25 AM   Specimen: Wound  Result Value Ref Range Status   Specimen Description WOUND RIGHT FOOT  Final   Special Requests NONE  Final   Gram Stain NO WBC SEEN NO ORGANISMS SEEN   Final   Culture   Final    RARE ESCHERICHIA COLI RARE KLEBSIELLA PNEUMONIAE NO ANAEROBES ISOLATED Performed at [facility], [place] [address] [place],  [address]    Report Status 05/30/2019 FINAL  Final   Organism ID, Bacteria ESCHERICHIA COLI  Final   Organism ID, Bacteria KLEBSIELLA PNEUMONIAE  Final      Susceptibility   Escherichia coli - MIC*    AMPICILLIN 16 INTERMEDIATE Intermediate     CEFAZOLIN <=4 SENSITIVE Sensitive     CEFEPIME <=1 SENSITIVE Sensitive     CEFTAZIDIME <=1 SENSITIVE Sensitive     CEFTRIAXONE <=1 SENSITIVE Sensitive     CIPROFLOXACIN <=0.25 SENSITIVE Sensitive     GENTAMICIN <=1 SENSITIVE Sensitive     IMIPENEM <=0.25 SENSITIVE Sensitive     TRIMETH/SULFA <=20 SENSITIVE Sensitive     AMPICILLIN/SULBACTAM 4 SENSITIVE Sensitive     PIP/TAZO <=4 SENSITIVE Sensitive     Extended ESBL  NEGATIVE Sensitive     * RARE ESCHERICHIA COLI   Klebsiella pneumoniae - MIC*    AMPICILLIN >=32 RESISTANT  Resistant     CEFAZOLIN <=4 SENSITIVE Sensitive     CEFEPIME <=1 SENSITIVE Sensitive     CEFTAZIDIME <=1 SENSITIVE Sensitive     CEFTRIAXONE <=1 SENSITIVE Sensitive     CIPROFLOXACIN <=0.25 SENSITIVE Sensitive     GENTAMICIN <=1 SENSITIVE Sensitive     IMIPENEM <=0.25 SENSITIVE Sensitive     TRIMETH/SULFA <=20 SENSITIVE Sensitive     AMPICILLIN/SULBACTAM 4 SENSITIVE Sensitive     PIP/TAZO 8 SENSITIVE Sensitive     Extended ESBL NEGATIVE Sensitive     * RARE KLEBSIELLA PNEUMONIAE   Time coordinating discharge: Over 30 minutes  SIGNED:   Little Ishikawa, DO [facility] 05/31/2019, 7:54 AM Pager   If 7PM-7AM, please contact night-coverage [email or web address] Password [email or web address]

## 2019-05-31 NOTE — Progress Notes (Signed)
Wound vac changed per orders. Is functioning with no leaks. Pt tolerated well. Waiting on home vac and DME to be delivered.

## 2019-05-31 NOTE — Progress Notes (Signed)
Pt given discharge instructions and gone over with him. Pt verbalized understanding. [employer] vac and supplies delivered to room along with walker and 3n1. All belongings gathered to be sent home, pt in no distress at discharge.

## 2019-05-31 NOTE — Plan of Care (Signed)
  Problem: Health Behavior/Discharge Planning: °Goal: Ability to manage health-related needs will improve °Outcome: Adequate for Discharge °  °Problem: Activity: °Goal: Risk for activity intolerance will decrease °Outcome: Adequate for Discharge °  °Problem: Pain Managment: °Goal: General experience of comfort will improve °Outcome: Adequate for Discharge °  °

## 2019-05-31 NOTE — Progress Notes (Signed)
[facility] Progress Note   Subjective:   Patient seen in room. Says "I'm good, I'm going home today." Denies and SOB, dyspnea, CP, abdominal pain, N/V/D. Afebrile today.   Objective Vitals:   05/30/19 1407 05/30/19 2122 05/31/19 0437 05/31/19 0745  BP: (!) 124/93 (!) 155/81 (!) 153/84 (!) 162/87  Pulse: 97 97 92 91  Resp: 18 16 16 16   Temp: 98.4 F (36.9 C) 98.7 F (37.1 C) 97.8 F (36.6 C) (!) 97.4 F (36.3 C)  TempSrc: Oral Oral Oral Oral  SpO2: 100% 100% 99% 100%  Weight:      Height:       Physical Exam General: well developed male, alert and in NAD Heart: RRR, no murmurs, rubs or gallops Lungs: CTA bilaterally without wheezing, rhonchi or rales Abdomen: Soft, non-tender, non-distended. + BS Extremities: Wound vac/dressing intact R foot. No peripheral edema Dialysis Access: RUE AVF + thrill  Additional Objective Labs: Basic Metabolic Panel: Recent Labs  Lab 05/25/19 1605 05/26/19 0434 05/27/19 0500 05/30/19 0837  NA 133* 133* 132* 136  K 4.2 3.7 3.5 3.9  CL 93* 91* 92* 97*  CO2 23 25 22  20*  GLUCOSE 132* 141* 134* 107*  BUN 67* 34* 51* 52*  CREATININE 12.43* 7.87* 11.07* 11.88*  CALCIUM 8.9 8.8* 8.9 8.9  PHOS 7.7*  --   --  7.6*   Liver Function Tests: Recent Labs  Lab 05/26/19 0434 05/27/19 0500 05/30/19 0837  AST 18 13* 24  ALT 51* 36 12  ALKPHOS 283* 236* 221*  BILITOT 0.8 0.9 0.5  PROT 6.6 6.5 6.4*  ALBUMIN 2.0* 2.1* 2.2*   No results for input(s): LIPASE, AMYLASE in the last 168 hours. CBC: Recent Labs  Lab 05/25/19 1606 05/26/19 0434 05/27/19 0500 05/30/19 0837  WBC 11.3* 15.6* 13.0* 9.7  NEUTROABS  --  12.9* 10.3*  --   HGB 8.4* 7.7* 6.9* 8.0*  HCT 26.8* 24.6* 22.5* 25.5*  MCV 88.4 89.8 90.7 88.2  PLT 334 361 369 352   Blood Culture    Component Value Date/Time   SDES WOUND RIGHT FOOT 05/25/2019 0925   SPECREQUEST NONE 05/25/2019 0925   CULT  05/25/2019 0925    RARE ESCHERICHIA COLI RARE KLEBSIELLA  PNEUMONIAE NO ANAEROBES ISOLATED Performed at [facility], [place] [address], [place], [place] [address]    REPTSTATUS 05/30/2019 FINAL 05/25/2019 [id number]    Cardiac Enzymes: No results for input(s): CKTOTAL, CKMB, CKMBINDEX, TROPONINI in the last 168 hours. CBG: Recent Labs  Lab 05/30/19 0357 05/30/19 0657 05/30/19 1702 05/30/19 2122 05/31/19 0741  GLUCAP 87 105* 131* 197* 123*   Medications: . sodium chloride Stopped (05/30/19 0513)  . sodium chloride 10 mL/hr at 05/27/19 1714  .  ceFAZolin (ANCEF) IV 2 g (05/30/19 2013)  . metronidazole 500 mg (05/31/19 0842)   . sodium chloride   Intravenous Once  . Chlorhexidine Gluconate Cloth  6 each Topical Q0600  . insulin aspart  0-9 Units Subcutaneous Q4H  . lactobacillus acidophilus  2 tablet Oral TID  . sevelamer carbonate  2.4 g Oral TID WC    Dialysis Orders: MWF at [facility] 4hr, 450/800, EDW 98kg, 2K/2.25Ca, UFP #3, AVF, heparin 2000 bolus - Hectoral 24mcg IV q HD - Mircera 33mcg IV q 2 weeks (last 7/15)  Assessment/Plan: 1. R foot cellulitis:S/p second toe partial amputation on 7/25, urgent I&D and second toe amputation on 8/3/2020and further debridement8/5.Further debridement and third toe amputated 8/7.Feeling well, denies pain.On IVcefazolin andmetronidazole. Per  podiatry.Per podiatry, continue IV cefazolin 2g with HD for 4 weeks, and PO flagyl for 4 weeks. Per podiatry notes, wound vac changes twice weekly by home health.  2. ESRD: MWF. Last HD yesterday. K+ 3.9. Continue dialysis at outpatient clinic tomorrow.  3. HTN/volume:   BP moderately elevated. He is now below his outpatient EDW, will continue to titrate down as tolerated at discharge.  4. Anemia:Hgb 12.0on admission-HGB 6.9on 8/7 and received2 units of PRBCs with HDon 8/7- refused labs this AM, says he only wants them to be drawn with dialysis.Continue ESA. 5. Metabolic bone [id number], corrected 10.4. Will hold hectorol, follow  calcium. Switched to renvela powder during last admission- continue same.Phos 7.7. Consider increasing renvela dose if phos does not improve.Requests labs be limited to HD when possible. 6. Nutrition:Renal diet with fluid restriction. 7. T2DM: Insulin per primary 8. Elevated LFTs: LFTsinitiallytrending up,now improving. Per primary  Anice Paganini, PA-C 05/31/2019, 9:19 AM  [facility] Pager: [phone]

## 2019-06-01 ENCOUNTER — Telehealth: Payer: Self-pay | Admitting: Podiatry

## 2019-06-01 ENCOUNTER — Other Ambulatory Visit: Payer: Self-pay | Admitting: Podiatry

## 2019-06-01 DIAGNOSIS — N186 End stage renal disease: Secondary | ICD-10-CM | POA: Diagnosis not present

## 2019-06-01 DIAGNOSIS — D509 Iron deficiency anemia, unspecified: Secondary | ICD-10-CM | POA: Diagnosis not present

## 2019-06-01 DIAGNOSIS — Z992 Dependence on renal dialysis: Secondary | ICD-10-CM | POA: Diagnosis not present

## 2019-06-01 DIAGNOSIS — D631 Anemia in chronic kidney disease: Secondary | ICD-10-CM | POA: Diagnosis not present

## 2019-06-01 DIAGNOSIS — M869 Osteomyelitis, unspecified: Secondary | ICD-10-CM | POA: Diagnosis not present

## 2019-06-01 DIAGNOSIS — N2581 Secondary hyperparathyroidism of renal origin: Secondary | ICD-10-CM | POA: Diagnosis not present

## 2019-06-01 NOTE — Telephone Encounter (Signed)
Called to check on patient no answer, VM left.

## 2019-06-02 ENCOUNTER — Encounter: Payer: Self-pay | Admitting: Podiatry

## 2019-06-02 ENCOUNTER — Telehealth: Payer: Self-pay | Admitting: *Deleted

## 2019-06-02 ENCOUNTER — Other Ambulatory Visit: Payer: Self-pay

## 2019-06-02 ENCOUNTER — Ambulatory Visit (INDEPENDENT_AMBULATORY_CARE_PROVIDER_SITE_OTHER): Payer: Medicare Other | Admitting: Podiatry

## 2019-06-02 VITALS — Temp 98.2°F

## 2019-06-02 DIAGNOSIS — E1169 Type 2 diabetes mellitus with other specified complication: Secondary | ICD-10-CM | POA: Diagnosis not present

## 2019-06-02 DIAGNOSIS — M869 Osteomyelitis, unspecified: Secondary | ICD-10-CM

## 2019-06-02 DIAGNOSIS — Z4800 Encounter for change or removal of nonsurgical wound dressing: Secondary | ICD-10-CM | POA: Diagnosis not present

## 2019-06-02 DIAGNOSIS — M86071 Acute hematogenous osteomyelitis, right ankle and foot: Secondary | ICD-10-CM | POA: Diagnosis not present

## 2019-06-02 DIAGNOSIS — E1122 Type 2 diabetes mellitus with diabetic chronic kidney disease: Secondary | ICD-10-CM | POA: Diagnosis not present

## 2019-06-02 DIAGNOSIS — E11621 Type 2 diabetes mellitus with foot ulcer: Secondary | ICD-10-CM | POA: Diagnosis not present

## 2019-06-02 DIAGNOSIS — Z09 Encounter for follow-up examination after completed treatment for conditions other than malignant neoplasm: Secondary | ICD-10-CM

## 2019-06-02 DIAGNOSIS — L97516 Non-pressure chronic ulcer of other part of right foot with bone involvement without evidence of necrosis: Secondary | ICD-10-CM | POA: Diagnosis not present

## 2019-06-02 NOTE — Telephone Encounter (Signed)
Left message for pt to bring the black and white sponges to appt 11:30am today. I informed pt I had left 2 messages.

## 2019-06-02 NOTE — Telephone Encounter (Signed)
Dr. March Rummage sent message by Secure Chat, requesting pt bring white sponge and black sponge. Left message requesting pt to bring white and black sponges to his 11:30am appt.

## 2019-06-03 ENCOUNTER — Ambulatory Visit (INDEPENDENT_AMBULATORY_CARE_PROVIDER_SITE_OTHER): Payer: Medicare Other | Admitting: Podiatry

## 2019-06-03 DIAGNOSIS — D509 Iron deficiency anemia, unspecified: Secondary | ICD-10-CM | POA: Diagnosis not present

## 2019-06-03 DIAGNOSIS — E08621 Diabetes mellitus due to underlying condition with foot ulcer: Secondary | ICD-10-CM | POA: Diagnosis not present

## 2019-06-03 DIAGNOSIS — M869 Osteomyelitis, unspecified: Secondary | ICD-10-CM | POA: Diagnosis not present

## 2019-06-03 DIAGNOSIS — L97513 Non-pressure chronic ulcer of other part of right foot with necrosis of muscle: Secondary | ICD-10-CM

## 2019-06-03 DIAGNOSIS — D631 Anemia in chronic kidney disease: Secondary | ICD-10-CM | POA: Diagnosis not present

## 2019-06-03 DIAGNOSIS — N2581 Secondary hyperparathyroidism of renal origin: Secondary | ICD-10-CM | POA: Diagnosis not present

## 2019-06-03 DIAGNOSIS — N186 End stage renal disease: Secondary | ICD-10-CM | POA: Diagnosis not present

## 2019-06-03 DIAGNOSIS — Z992 Dependence on renal dialysis: Secondary | ICD-10-CM | POA: Diagnosis not present

## 2019-06-03 NOTE — Progress Notes (Signed)
Subjective:  Patient ID: [name], male    DOB: [birth year],  MRN: [id number]  Chief Complaint  Patient presents with  . Routine Post Op    DOS 05/14/2019 AMPUTATION TOE INTERPHALANGEAL 2ND RT AND OPEN SUPERFICIAL BONE BIOPSY HALLUX LT " my foot seems to feel ok, I am concerned with the way the wound vac was put on in the hiospital"    Date of Service: 05/14/2019 Procedures:             1) right great toe bone biopsy             2) right great toe amputation interphalangeal joint  Date of Service: 05/24/2019 Procedures:             1) Incision and drainage of deep space abscess of the right foot             2) Right second toe amputation at the metatarsophalangeal joint  Date of Service: 05/25/2019 Procedures:             1) Incision and drainage of right foot infection, multiple bursa  Date of Service: 05/27/2019 Procedures:             1) Amputation right third toe metatarsophalangeal joint             2) Debridement irrigation of right foot down to bone             3) Application of wound VAC  53 y.o. male presents for wound care. Was concerned that his wound VAC was not put on the same way it was put on by the hospital RN as it was previously done by Dr. March Rummage. States the wound was not cleansed prior to application.   Could not get dose of IV abx at dialysis yesterday as his session was shortened because his fistula started to bleed and he was removed from the machine early, thus didn't get his Abx.  States he got his foot wet in the shower this AM prior to his visit.  Review of Systems: Negative except as noted in the HPI. Denies N/V/F/Ch.  Past Medical History:  Diagnosis Date  . Chills    at night - sometimes  . Complication of anesthesia   . Diabetes mellitus    controlled by diet  . 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 Eczema   . Fatigue    loss of fatigue  . Headache(784.0)    Years ago  . Heart murmur    years ago  . History of blood  transfusion   . Hypertension    sees Dr. Jaci Standard  . MRSA (methicillin resistant staph aureus) culture positive   . PONV (postoperative nausea and vomiting)   . Poor circulation   . Renal disorder     Current Outpatient Medications:  .  acetaminophen (TYLENOL) 500 MG tablet, Take 1,000 mg by mouth every 6 (six) hours as needed for moderate pain or headache., Disp: , Rfl:  .  blood glucose meter kit and supplies, Dispense based on patient and insurance preference. Use up to four times daily as directed. (FOR ICD-10 E10.9, E11.9)., Disp: 1 each, Rfl: 0 .  blood glucose meter kit and supplies, Dispense based on patient and insurance preference. Use up to four times daily as directed. (FOR ICD-9 250.00, 250.01)., Disp: 1 each, Rfl: 0 .  ceFAZolin (ANCEF) 2-4 GM/100ML-% IVPB, Inject 100 mLs (2 g total) into the vein every Monday, Wednesday, and Friday at  8 PM for 12 doses. After dialysis, Disp: 1 each, Rfl: 0 .  ferric citrate (AURYXIA) 1 GM 210 MG(Fe) tablet, Take 210-630 mg by mouth See admin instructions. Take 3 tablets (630 mg) by mouth up to three times daily with meals, take 1 tablet (210 mg) with snacks, Disp: , Rfl:  .  glucose blood (ACCU-CHEK AVIVA PLUS) test strip, Use as instructed, Disp: 100 each, Rfl: 1 .  HYDROcodone-acetaminophen (NORCO) 10-325 MG tablet, Take 1 tablet by mouth every 8 (eight) hours as needed. (Patient taking differently: Take 1 tablet by mouth every 8 (eight) hours as needed (pain). ), Disp: 20 tablet, Rfl: 0 .  Lancet Devices (ACCU-CHEK SOFTCLIX) lancets, Use as instructed, Disp: 100 each, Rfl: 5 .  Lanthanum Carbonate (FOSRENOL) 1000 MG PACK, Take 1,000 mg by mouth 3 (three) times daily with meals. , Disp: , Rfl:  .  loratadine (CLARITIN) 10 MG tablet, Take 10 mg by mouth daily as needed for allergies., Disp: , Rfl:  .  Melatonin 5 MG CAPS, Take 10 mg by mouth at bedtime as needed (sleep)., Disp: , Rfl:  .  metroNIDAZOLE (FLAGYL) 500 MG tablet, Take 1 tablet (500 mg  total) by mouth 3 (three) times daily for 28 days., Disp: 84 tablet, Rfl: 0 .  multivitamin (RENA-VIT) TABS tablet, Take 1 tablet by mouth See admin instructions. Take one tablet by mouth on Monday, Wednesday, Friday after dialysis, Disp: , Rfl:  .  OVER THE COUNTER MEDICATION, Take 1 Bottle by mouth 2 (two) times a day. Vegan Protein Shake, Disp: , Rfl:   Social History   Tobacco Use  Smoking Status Never Smoker  Smokeless Tobacco Never Used    Allergies  Allergen Reactions  . Amoxicillin Itching    Did it involve swelling of the face/tongue/throat, SOB, or low BP? Unknown Did it involve sudden or severe rash/hives, skin peeling, or any reaction on the inside of your mouth or nose? Unknown Did you need to seek medical attention at a hospital or doctor's office? Unknown When did it last happen?20 years ago If all above answers are "NO", may proceed with cephalosporin use.   Marland Kitchen Hydromorphone Itching    Patient states he may take with benadryl. Makes feet itch.  Marland Kitchen Percocet [Oxycodone-Acetaminophen] Itching and Other (See Comments)    Legs only. Itching and burning feeling.   Objective:   Vitals:   06/02/19 1204  Temp: 98.2 F (36.8 C)   There is no height or weight on file to calculate BMI. Constitutional Well developed. Well nourished.  Vascular Dorsalis pedis pulse palpable right. No cyanosis or clubbing noted. Pedal hair growth absent. Right 4th toe appears with darkened skin plantarly but healthy viable skin dorsally.  Neurologic Normal speech. Oriented to person, place, and time. Protective sensation absent  Dermatologic Right foot healing surgical wound, no purulence, no erythema, no signs of acute infection. Exposed metatarsal heads but they appear viable. Maceration noted about the 4th/5th toes.  Orthopedic: Slight pain to palpation about the wound.   Radiographs: None today. Assessment:   1. Osteomyelitis of great toe of right foot (Louise)   2. Surgery  follow-up    Plan:  Patient was evaluated and treated and all questions answered.  S/p Incision and Drainage of R Foot Infection, Amputation R 2nd/3rd Toes -Discussed that the wound VAC appeared to be applied correctly and there is variance in application. His HHC will likely apply the VAC slightly differently than I do. Discussed this is normal though  I would prefer they cleanse the wound prior to application. -Wound granulating in, some devitalized tissue today but no signs of infection. -Advised to take PO keflex he has since he did not get Abx at HD. -Continue IV Abx with HD. -Continue PO flagyl -Wound gently excisionally debrided today with tissue nipper and 312 blade. Covered under global. -Betadine WTD applied today. Betadine applied for maceration of the 4th/5th toes likely due to showering. Less likely from [facility] as good seal was noted. -Return tomorrow for wound VAC application. -Continue HHC. Wound VAC likely to be reapplied Tuesday.  No follow-ups on file.

## 2019-06-06 ENCOUNTER — Other Ambulatory Visit: Payer: Self-pay

## 2019-06-06 ENCOUNTER — Ambulatory Visit: Payer: Medicare Other

## 2019-06-06 DIAGNOSIS — D631 Anemia in chronic kidney disease: Secondary | ICD-10-CM | POA: Diagnosis not present

## 2019-06-06 DIAGNOSIS — D509 Iron deficiency anemia, unspecified: Secondary | ICD-10-CM | POA: Diagnosis not present

## 2019-06-06 DIAGNOSIS — Z992 Dependence on renal dialysis: Secondary | ICD-10-CM | POA: Diagnosis not present

## 2019-06-06 DIAGNOSIS — M869 Osteomyelitis, unspecified: Secondary | ICD-10-CM | POA: Diagnosis not present

## 2019-06-06 DIAGNOSIS — E08621 Diabetes mellitus due to underlying condition with foot ulcer: Secondary | ICD-10-CM

## 2019-06-06 DIAGNOSIS — Z09 Encounter for follow-up examination after completed treatment for conditions other than malignant neoplasm: Secondary | ICD-10-CM

## 2019-06-06 DIAGNOSIS — N2581 Secondary hyperparathyroidism of renal origin: Secondary | ICD-10-CM | POA: Diagnosis not present

## 2019-06-06 DIAGNOSIS — N186 End stage renal disease: Secondary | ICD-10-CM | POA: Diagnosis not present

## 2019-06-06 MED ORDER — HYDROCODONE-ACETAMINOPHEN 10-325 MG PO TABS: 1.0000 | ORAL_TABLET | Freq: Three times a day (TID) | ORAL | 0 refills | Status: DC | PRN

## 2019-06-07 DIAGNOSIS — M86071 Acute hematogenous osteomyelitis, right ankle and foot: Secondary | ICD-10-CM | POA: Diagnosis not present

## 2019-06-07 DIAGNOSIS — L97516 Non-pressure chronic ulcer of other part of right foot with bone involvement without evidence of necrosis: Secondary | ICD-10-CM | POA: Diagnosis not present

## 2019-06-07 DIAGNOSIS — E1122 Type 2 diabetes mellitus with diabetic chronic kidney disease: Secondary | ICD-10-CM | POA: Diagnosis not present

## 2019-06-07 DIAGNOSIS — E1169 Type 2 diabetes mellitus with other specified complication: Secondary | ICD-10-CM | POA: Diagnosis not present

## 2019-06-07 DIAGNOSIS — Z4800 Encounter for change or removal of nonsurgical wound dressing: Secondary | ICD-10-CM | POA: Diagnosis not present

## 2019-06-07 DIAGNOSIS — E11621 Type 2 diabetes mellitus with foot ulcer: Secondary | ICD-10-CM | POA: Diagnosis not present

## 2019-06-08 ENCOUNTER — Ambulatory Visit: Payer: Medicare Other

## 2019-06-08 ENCOUNTER — Other Ambulatory Visit: Payer: Self-pay

## 2019-06-08 DIAGNOSIS — Z4781 Encounter for orthopedic aftercare following surgical amputation: Secondary | ICD-10-CM | POA: Diagnosis not present

## 2019-06-08 DIAGNOSIS — Z4800 Encounter for change or removal of nonsurgical wound dressing: Secondary | ICD-10-CM | POA: Diagnosis not present

## 2019-06-08 DIAGNOSIS — E1169 Type 2 diabetes mellitus with other specified complication: Secondary | ICD-10-CM | POA: Diagnosis not present

## 2019-06-08 DIAGNOSIS — Z794 Long term (current) use of insulin: Secondary | ICD-10-CM | POA: Diagnosis not present

## 2019-06-08 DIAGNOSIS — M86071 Acute hematogenous osteomyelitis, right ankle and foot: Secondary | ICD-10-CM | POA: Diagnosis not present

## 2019-06-08 DIAGNOSIS — Z89421 Acquired absence of other right toe(s): Secondary | ICD-10-CM | POA: Diagnosis not present

## 2019-06-08 DIAGNOSIS — D631 Anemia in chronic kidney disease: Secondary | ICD-10-CM | POA: Diagnosis not present

## 2019-06-08 DIAGNOSIS — E1151 Type 2 diabetes mellitus with diabetic peripheral angiopathy without gangrene: Secondary | ICD-10-CM | POA: Diagnosis not present

## 2019-06-08 DIAGNOSIS — D509 Iron deficiency anemia, unspecified: Secondary | ICD-10-CM | POA: Diagnosis not present

## 2019-06-08 DIAGNOSIS — N186 End stage renal disease: Secondary | ICD-10-CM | POA: Diagnosis not present

## 2019-06-08 DIAGNOSIS — M869 Osteomyelitis, unspecified: Secondary | ICD-10-CM | POA: Diagnosis not present

## 2019-06-08 DIAGNOSIS — Z09 Encounter for follow-up examination after completed treatment for conditions other than malignant neoplasm: Secondary | ICD-10-CM

## 2019-06-08 DIAGNOSIS — E1122 Type 2 diabetes mellitus with diabetic chronic kidney disease: Secondary | ICD-10-CM | POA: Diagnosis not present

## 2019-06-08 DIAGNOSIS — Z992 Dependence on renal dialysis: Secondary | ICD-10-CM | POA: Diagnosis not present

## 2019-06-08 DIAGNOSIS — E08621 Diabetes mellitus due to underlying condition with foot ulcer: Secondary | ICD-10-CM

## 2019-06-08 DIAGNOSIS — I120 Hypertensive chronic kidney disease with stage 5 chronic kidney disease or end stage renal disease: Secondary | ICD-10-CM | POA: Diagnosis not present

## 2019-06-08 DIAGNOSIS — N2581 Secondary hyperparathyroidism of renal origin: Secondary | ICD-10-CM | POA: Diagnosis not present

## 2019-06-09 ENCOUNTER — Ambulatory Visit (INDEPENDENT_AMBULATORY_CARE_PROVIDER_SITE_OTHER): Payer: Medicare Other | Admitting: Podiatry

## 2019-06-09 DIAGNOSIS — Z09 Encounter for follow-up examination after completed treatment for conditions other than malignant neoplasm: Secondary | ICD-10-CM | POA: Diagnosis not present

## 2019-06-09 DIAGNOSIS — M86671 Other chronic osteomyelitis, right ankle and foot: Secondary | ICD-10-CM | POA: Diagnosis not present

## 2019-06-09 DIAGNOSIS — M869 Osteomyelitis, unspecified: Secondary | ICD-10-CM

## 2019-06-09 DIAGNOSIS — E08621 Diabetes mellitus due to underlying condition with foot ulcer: Secondary | ICD-10-CM

## 2019-06-09 DIAGNOSIS — L97513 Non-pressure chronic ulcer of other part of right foot with necrosis of muscle: Secondary | ICD-10-CM | POA: Diagnosis not present

## 2019-06-10 ENCOUNTER — Encounter: Payer: Medicare Other | Admitting: Podiatry

## 2019-06-10 DIAGNOSIS — N186 End stage renal disease: Secondary | ICD-10-CM | POA: Diagnosis not present

## 2019-06-10 DIAGNOSIS — D509 Iron deficiency anemia, unspecified: Secondary | ICD-10-CM | POA: Diagnosis not present

## 2019-06-10 DIAGNOSIS — M869 Osteomyelitis, unspecified: Secondary | ICD-10-CM | POA: Diagnosis not present

## 2019-06-10 DIAGNOSIS — D631 Anemia in chronic kidney disease: Secondary | ICD-10-CM | POA: Diagnosis not present

## 2019-06-10 DIAGNOSIS — N2581 Secondary hyperparathyroidism of renal origin: Secondary | ICD-10-CM | POA: Diagnosis not present

## 2019-06-10 DIAGNOSIS — Z992 Dependence on renal dialysis: Secondary | ICD-10-CM | POA: Diagnosis not present

## 2019-06-11 DIAGNOSIS — E1169 Type 2 diabetes mellitus with other specified complication: Secondary | ICD-10-CM | POA: Diagnosis not present

## 2019-06-11 DIAGNOSIS — E1122 Type 2 diabetes mellitus with diabetic chronic kidney disease: Secondary | ICD-10-CM | POA: Diagnosis not present

## 2019-06-11 DIAGNOSIS — M86071 Acute hematogenous osteomyelitis, right ankle and foot: Secondary | ICD-10-CM | POA: Diagnosis not present

## 2019-06-11 DIAGNOSIS — E1151 Type 2 diabetes mellitus with diabetic peripheral angiopathy without gangrene: Secondary | ICD-10-CM | POA: Diagnosis not present

## 2019-06-11 DIAGNOSIS — Z4800 Encounter for change or removal of nonsurgical wound dressing: Secondary | ICD-10-CM | POA: Diagnosis not present

## 2019-06-11 DIAGNOSIS — Z4781 Encounter for orthopedic aftercare following surgical amputation: Secondary | ICD-10-CM | POA: Diagnosis not present

## 2019-06-12 DIAGNOSIS — E1169 Type 2 diabetes mellitus with other specified complication: Secondary | ICD-10-CM | POA: Diagnosis not present

## 2019-06-12 DIAGNOSIS — M86071 Acute hematogenous osteomyelitis, right ankle and foot: Secondary | ICD-10-CM | POA: Diagnosis not present

## 2019-06-12 DIAGNOSIS — E1122 Type 2 diabetes mellitus with diabetic chronic kidney disease: Secondary | ICD-10-CM | POA: Diagnosis not present

## 2019-06-12 DIAGNOSIS — E1151 Type 2 diabetes mellitus with diabetic peripheral angiopathy without gangrene: Secondary | ICD-10-CM | POA: Diagnosis not present

## 2019-06-12 DIAGNOSIS — Z4781 Encounter for orthopedic aftercare following surgical amputation: Secondary | ICD-10-CM | POA: Diagnosis not present

## 2019-06-12 DIAGNOSIS — Z4800 Encounter for change or removal of nonsurgical wound dressing: Secondary | ICD-10-CM | POA: Diagnosis not present

## 2019-06-13 DIAGNOSIS — D509 Iron deficiency anemia, unspecified: Secondary | ICD-10-CM | POA: Diagnosis not present

## 2019-06-13 DIAGNOSIS — M869 Osteomyelitis, unspecified: Secondary | ICD-10-CM | POA: Diagnosis not present

## 2019-06-13 DIAGNOSIS — Z992 Dependence on renal dialysis: Secondary | ICD-10-CM | POA: Diagnosis not present

## 2019-06-13 DIAGNOSIS — N186 End stage renal disease: Secondary | ICD-10-CM | POA: Diagnosis not present

## 2019-06-13 DIAGNOSIS — N2581 Secondary hyperparathyroidism of renal origin: Secondary | ICD-10-CM | POA: Diagnosis not present

## 2019-06-13 DIAGNOSIS — D631 Anemia in chronic kidney disease: Secondary | ICD-10-CM | POA: Diagnosis not present

## 2019-06-14 ENCOUNTER — Other Ambulatory Visit: Payer: Medicare Other

## 2019-06-14 DIAGNOSIS — E1122 Type 2 diabetes mellitus with diabetic chronic kidney disease: Secondary | ICD-10-CM | POA: Diagnosis not present

## 2019-06-14 DIAGNOSIS — Z4781 Encounter for orthopedic aftercare following surgical amputation: Secondary | ICD-10-CM | POA: Diagnosis not present

## 2019-06-14 DIAGNOSIS — Z4800 Encounter for change or removal of nonsurgical wound dressing: Secondary | ICD-10-CM | POA: Diagnosis not present

## 2019-06-14 DIAGNOSIS — E1151 Type 2 diabetes mellitus with diabetic peripheral angiopathy without gangrene: Secondary | ICD-10-CM | POA: Diagnosis not present

## 2019-06-14 DIAGNOSIS — M86071 Acute hematogenous osteomyelitis, right ankle and foot: Secondary | ICD-10-CM | POA: Diagnosis not present

## 2019-06-14 DIAGNOSIS — E1169 Type 2 diabetes mellitus with other specified complication: Secondary | ICD-10-CM | POA: Diagnosis not present

## 2019-06-15 DIAGNOSIS — M869 Osteomyelitis, unspecified: Secondary | ICD-10-CM | POA: Diagnosis not present

## 2019-06-15 DIAGNOSIS — N2581 Secondary hyperparathyroidism of renal origin: Secondary | ICD-10-CM | POA: Diagnosis not present

## 2019-06-15 DIAGNOSIS — Z992 Dependence on renal dialysis: Secondary | ICD-10-CM | POA: Diagnosis not present

## 2019-06-15 DIAGNOSIS — N186 End stage renal disease: Secondary | ICD-10-CM | POA: Diagnosis not present

## 2019-06-15 DIAGNOSIS — D509 Iron deficiency anemia, unspecified: Secondary | ICD-10-CM | POA: Diagnosis not present

## 2019-06-15 DIAGNOSIS — D631 Anemia in chronic kidney disease: Secondary | ICD-10-CM | POA: Diagnosis not present

## 2019-06-16 ENCOUNTER — Encounter (HOSPITAL_BASED_OUTPATIENT_CLINIC_OR_DEPARTMENT_OTHER): Payer: Medicare Other | Attending: Internal Medicine

## 2019-06-16 ENCOUNTER — Other Ambulatory Visit: Payer: Self-pay

## 2019-06-16 ENCOUNTER — Ambulatory Visit (INDEPENDENT_AMBULATORY_CARE_PROVIDER_SITE_OTHER): Payer: Medicare Other | Admitting: Podiatry

## 2019-06-16 ENCOUNTER — Telehealth: Payer: Self-pay | Admitting: *Deleted

## 2019-06-16 DIAGNOSIS — Z992 Dependence on renal dialysis: Secondary | ICD-10-CM | POA: Insufficient documentation

## 2019-06-16 DIAGNOSIS — Z89421 Acquired absence of other right toe(s): Secondary | ICD-10-CM | POA: Diagnosis not present

## 2019-06-16 DIAGNOSIS — E1151 Type 2 diabetes mellitus with diabetic peripheral angiopathy without gangrene: Secondary | ICD-10-CM | POA: Insufficient documentation

## 2019-06-16 DIAGNOSIS — I96 Gangrene, not elsewhere classified: Secondary | ICD-10-CM | POA: Diagnosis not present

## 2019-06-16 DIAGNOSIS — E1152 Type 2 diabetes mellitus with diabetic peripheral angiopathy with gangrene: Secondary | ICD-10-CM | POA: Diagnosis not present

## 2019-06-16 DIAGNOSIS — M86471 Chronic osteomyelitis with draining sinus, right ankle and foot: Secondary | ICD-10-CM | POA: Diagnosis not present

## 2019-06-16 DIAGNOSIS — I120 Hypertensive chronic kidney disease with stage 5 chronic kidney disease or end stage renal disease: Secondary | ICD-10-CM | POA: Diagnosis not present

## 2019-06-16 DIAGNOSIS — T8131XA Disruption of external operation (surgical) wound, not elsewhere classified, initial encounter: Secondary | ICD-10-CM | POA: Insufficient documentation

## 2019-06-16 DIAGNOSIS — E1169 Type 2 diabetes mellitus with other specified complication: Secondary | ICD-10-CM | POA: Insufficient documentation

## 2019-06-16 DIAGNOSIS — Y838 Other surgical procedures as the cause of abnormal reaction of the patient, or of later complication, without mention of misadventure at the time of the procedure: Secondary | ICD-10-CM | POA: Diagnosis not present

## 2019-06-16 DIAGNOSIS — T8189XA Other complications of procedures, not elsewhere classified, initial encounter: Secondary | ICD-10-CM | POA: Diagnosis not present

## 2019-06-16 DIAGNOSIS — E1122 Type 2 diabetes mellitus with diabetic chronic kidney disease: Secondary | ICD-10-CM | POA: Insufficient documentation

## 2019-06-16 DIAGNOSIS — N186 End stage renal disease: Secondary | ICD-10-CM | POA: Diagnosis not present

## 2019-06-16 DIAGNOSIS — Z09 Encounter for follow-up examination after completed treatment for conditions other than malignant neoplasm: Secondary | ICD-10-CM

## 2019-06-16 MED ORDER — HYDROCODONE-ACETAMINOPHEN 10-325 MG PO TABS: 1.0000 | ORAL_TABLET | Freq: Three times a day (TID) | ORAL | 0 refills | Status: DC | PRN

## 2019-06-16 NOTE — Telephone Encounter (Addendum)
Pt states he is still established with [facility]. Faxed orders for Discontinue wound vac, cleanse wound, apply Hydrogel over wound base only not to the periwound, cover with 4 x 4 gauze, kerlix, tape, no ace wrap, two x week, to be seen in office once week.

## 2019-06-16 NOTE — Progress Notes (Signed)
Patient returns today for wound VAC application. Was seen yesterday but wound was too macerated for application. See yesterday's note for full details. Wound not macerated today. No signs of acute infection noted. VAC applied with good seal noted.  Procedure: Wound VAC Application Location: right foot Wound Measurement: 7 cm x 5.5 cm x 3 cm  Technique: white foam to exposed metatarsals, black foam to wound base, followed by adherent dressing. Set to 125 mmHg with good seal noted. Disposition: Patient tolerated procedure well.

## 2019-06-16 NOTE — Progress Notes (Signed)
Subjective:  Patient ID: [name], male    DOB: 1966/08/17,  MRN: [id number]  No chief complaint on file.  Date of Service: 05/14/2019 Procedures:             1) right great toe bone biopsy             2) right great toe amputation interphalangeal joint  Date of Service: 05/24/2019 Procedures:             1) Incision and drainage of deep space abscess of the right foot             2) Right second toe amputation at the metatarsophalangeal joint  Date of Service: 05/25/2019 Procedures:             1) Incision and drainage of right foot infection, multiple bursa  Date of Service: 05/27/2019 Procedures:             1) Amputation right third toe metatarsophalangeal joint             2) Debridement irrigation of right foot down to bone             3) Application of wound VAC  53 y.o. male presents for wound care.  States that his home health care has had difficulty forming a seal except the wound VAC machine itself is not functioning well.  Receiving antibiotics at dialysis.  Denies other complaints.  Review of Systems: Negative except as noted in the HPI. Denies N/V/F/Ch.  Past Medical History:  Diagnosis Date  . Chills    at night - sometimes  . Complication of anesthesia   . Diabetes mellitus    controlled by diet  . 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 Eczema   . Fatigue    loss of fatigue  . Headache(784.0)    Years ago  . Heart murmur    years ago  . History of blood transfusion   . Hypertension    sees Dr. Jaci Standard  . MRSA (methicillin resistant staph aureus) culture positive   . PONV (postoperative nausea and vomiting)   . Poor circulation   . Renal disorder     Current Outpatient Medications:  .  acetaminophen (TYLENOL) 500 MG tablet, Take 1,000 mg by mouth every 6 (six) hours as needed for moderate pain or headache., Disp: , Rfl:  .  blood glucose meter kit and supplies, Dispense based on patient and insurance preference. Use up to four times  daily as directed. (FOR ICD-10 E10.9, E11.9)., Disp: 1 each, Rfl: 0 .  blood glucose meter kit and supplies, Dispense based on patient and insurance preference. Use up to four times daily as directed. (FOR ICD-9 250.00, 250.01)., Disp: 1 each, Rfl: 0 .  ceFAZolin (ANCEF) 2-4 GM/100ML-% IVPB, Inject 100 mLs (2 g total) into the vein every Monday, Wednesday, and Friday at 8 PM for 12 doses. After dialysis, Disp: 1 each, Rfl: 0 .  ferric citrate (AURYXIA) 1 GM 210 MG(Fe) tablet, Take 210-630 mg by mouth See admin instructions. Take 3 tablets (630 mg) by mouth up to three times daily with meals, take 1 tablet (210 mg) with snacks, Disp: , Rfl:  .  glucose blood (ACCU-CHEK AVIVA PLUS) test strip, Use as instructed, Disp: 100 each, Rfl: 1 .  HYDROcodone-acetaminophen (NORCO) 10-325 MG tablet, Take 1 tablet by mouth every 8 (eight) hours as needed (pain)., Disp: 20 tablet, Rfl: 0 .  Lancet Devices (ACCU-CHEK SOFTCLIX) lancets, Use as  instructed, Disp: 100 each, Rfl: 5 .  Lanthanum Carbonate (FOSRENOL) 1000 MG PACK, Take 1,000 mg by mouth 3 (three) times daily with meals. , Disp: , Rfl:  .  loratadine (CLARITIN) 10 MG tablet, Take 10 mg by mouth daily as needed for allergies., Disp: , Rfl:  .  Melatonin 5 MG CAPS, Take 10 mg by mouth at bedtime as needed (sleep)., Disp: , Rfl:  .  metroNIDAZOLE (FLAGYL) 500 MG tablet, Take 1 tablet (500 mg total) by mouth 3 (three) times daily for 28 days., Disp: 84 tablet, Rfl: 0 .  multivitamin (RENA-VIT) TABS tablet, Take 1 tablet by mouth See admin instructions. Take one tablet by mouth on Monday, Wednesday, Friday after dialysis, Disp: , Rfl:  .  OVER THE COUNTER MEDICATION, Take 1 Bottle by mouth 2 (two) times a day. Vegan Protein Shake, Disp: , Rfl:   Social History   Tobacco Use  Smoking Status Never Smoker  Smokeless Tobacco Never Used    Allergies  Allergen Reactions  . Amoxicillin Itching    Did it involve swelling of the face/tongue/throat, SOB, or low  BP? Unknown Did it involve sudden or severe rash/hives, skin peeling, or any reaction on the inside of your mouth or nose? Unknown Did you need to seek medical attention at a hospital or doctor's office? Unknown When did it last happen?20 years ago If all above answers are "NO", may proceed with cephalosporin use.   Marland Kitchen Hydromorphone Itching    Patient states he may take with benadryl. Makes feet itch.  Marland Kitchen Percocet [Oxycodone-Acetaminophen] Itching and Other (See Comments)    Legs only. Itching and burning feeling.   Objective:   There were no vitals filed for this visit. There is no height or weight on file to calculate BMI. Constitutional Well developed. Well nourished.  Vascular Dorsalis pedis pulse palpable right. No cyanosis or clubbing noted. Pedal hair growth absent. Right 4th toe appears with darkened skin plantarly but healthy viable skin dorsally.  Neurologic Normal speech. Oriented to person, place, and time. Protective sensation absent  Dermatologic Right foot surgical wound, no purulence, no erythema, no signs of acute infection. Wound with devitalized areas plantar to the 4th toe. Exposed metatarsal heads but they appear viable.  Progressive darkening of the right fourth and fifth toes plantarly.  Dorsally did appear with viable skin and warm to touch but cool plantarly.  Orthopedic: Slight pain to palpation about the wound.   Radiographs: None today. Assessment:   1. Osteomyelitis of great toe of right foot ([place])   2. Surgery follow-up   3. Chronic osteomyelitis of toe of right foot ([place])   4. 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  S/p Incision and Drainage of R Foot Infection, Amputation R 2nd/3rd Toes -Continue IV Abx with HD. -Continue PO flagyl  -Wound gently excisionally debrided today  with tissue nipper and 312 blade. Covered under global. -Wound VAC applied but ultimately removed as seal was not able to be applied despite multiple attempts at taping. The wound is in a different area and the devascularized toes are making it difficult to seal the area off without risking further vascular insult. -Ordered change of wound VAC machine. Called [employer] to facilitate. While I do think it is difficult to form a seal due to the location of his wound I  think the canister is not holding to the machine well which is contributory to the lack of a good seal. -Discussed with patient that due to respiratory new digits he ultimately may benefit from conversion to transmetatarsal amputation.  Calcified vessels were noted during debridement of the wound.  Discussed with patient poor healing due to microvascular disease.  Discussed the conversion to transmetatarsal incision may provide more stable foot type and resolution of the wound as he has a palpable dorsalis pedis pulse and bringing the amputation back may help to heal the area where the vessels are larger.  Discussed that as long as there are no acute signs of infection he can think this over and we can discuss further next week. -Continue HHC.  Wound VAC to be reapplied over the weekend  No follow-ups on file.

## 2019-06-17 DIAGNOSIS — N186 End stage renal disease: Secondary | ICD-10-CM | POA: Diagnosis not present

## 2019-06-17 DIAGNOSIS — D631 Anemia in chronic kidney disease: Secondary | ICD-10-CM | POA: Diagnosis not present

## 2019-06-17 DIAGNOSIS — M869 Osteomyelitis, unspecified: Secondary | ICD-10-CM | POA: Diagnosis not present

## 2019-06-17 DIAGNOSIS — N2581 Secondary hyperparathyroidism of renal origin: Secondary | ICD-10-CM | POA: Diagnosis not present

## 2019-06-17 DIAGNOSIS — Z992 Dependence on renal dialysis: Secondary | ICD-10-CM | POA: Diagnosis not present

## 2019-06-17 DIAGNOSIS — D509 Iron deficiency anemia, unspecified: Secondary | ICD-10-CM | POA: Diagnosis not present

## 2019-06-18 DIAGNOSIS — E1169 Type 2 diabetes mellitus with other specified complication: Secondary | ICD-10-CM | POA: Diagnosis not present

## 2019-06-18 DIAGNOSIS — Z4800 Encounter for change or removal of nonsurgical wound dressing: Secondary | ICD-10-CM | POA: Diagnosis not present

## 2019-06-18 DIAGNOSIS — E1122 Type 2 diabetes mellitus with diabetic chronic kidney disease: Secondary | ICD-10-CM | POA: Diagnosis not present

## 2019-06-18 DIAGNOSIS — M86071 Acute hematogenous osteomyelitis, right ankle and foot: Secondary | ICD-10-CM | POA: Diagnosis not present

## 2019-06-18 DIAGNOSIS — Z4781 Encounter for orthopedic aftercare following surgical amputation: Secondary | ICD-10-CM | POA: Diagnosis not present

## 2019-06-18 DIAGNOSIS — E1151 Type 2 diabetes mellitus with diabetic peripheral angiopathy without gangrene: Secondary | ICD-10-CM | POA: Diagnosis not present

## 2019-06-20 DIAGNOSIS — N2581 Secondary hyperparathyroidism of renal origin: Secondary | ICD-10-CM | POA: Diagnosis not present

## 2019-06-20 DIAGNOSIS — Z992 Dependence on renal dialysis: Secondary | ICD-10-CM | POA: Diagnosis not present

## 2019-06-20 DIAGNOSIS — N186 End stage renal disease: Secondary | ICD-10-CM | POA: Diagnosis not present

## 2019-06-20 DIAGNOSIS — D509 Iron deficiency anemia, unspecified: Secondary | ICD-10-CM | POA: Diagnosis not present

## 2019-06-20 DIAGNOSIS — M869 Osteomyelitis, unspecified: Secondary | ICD-10-CM | POA: Diagnosis not present

## 2019-06-20 DIAGNOSIS — D631 Anemia in chronic kidney disease: Secondary | ICD-10-CM | POA: Diagnosis not present

## 2019-06-20 NOTE — Progress Notes (Signed)
Subjective:  Patient ID: [name], male    DOB: June 28, 1966,  MRN: [id number]  Chief Complaint  Patient presents with   Foot Pain    right foot f/u for wound care, pt states that his left toes has become blackpt states that he is having a hard time maintaining blood flow, pt is concerned that he is not healing right    Date of Service: 05/14/2019 Procedures:             1) right great toe bone biopsy             2) right great toe amputation interphalangeal joint  Date of Service: 05/24/2019 Procedures:             1) Incision and drainage of deep space abscess of the right foot             2) Right second toe amputation at the metatarsophalangeal joint  Date of Service: 05/25/2019 Procedures:             1) Incision and drainage of right foot infection, multiple bursa  Date of Service: 05/27/2019 Procedures:             1) Amputation right third toe metatarsophalangeal joint             2) Debridement irrigation of right foot down to bone             3) Application of wound VAC  53 y.o. male presents for wound care. Says that fourth and 5th toes have become darker.  States that he was seen at the wound care center as sent by his nephrologist for second opinion.  Says that he was told that he may need to see another vascular surgeon for a look at the blood flow.  Had issues with the wound VAC sealing and was additionally obtained hydrocolloid dressing and skin barrier prep which he states did help to which he sealed.  States that his wound VAC machine was exchanged out.    Review of Systems: Negative except as noted in the HPI. Denies N/V/F/Ch.  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 Jaci Standard   MRSA (methicillin resistant staph aureus) culture positive    PONV (postoperative nausea and vomiting)    Poor circulation    Renal disorder     Current Outpatient Medications:    acetaminophen (TYLENOL) 500 MG tablet, Take 1,000 mg by mouth every 6 (six) hours as needed for moderate pain or headache., Disp: , Rfl:    blood glucose meter kit and supplies, Dispense based on patient and insurance preference. Use up to four times daily as directed. (FOR ICD-10 E10.9, E11.9)., Disp: 1 each, Rfl: 0   blood glucose meter kit and supplies, Dispense based on patient and insurance preference. Use up to four times daily as directed. (FOR ICD-9 250.00, 250.01)., Disp: 1 each, Rfl: 0   ceFAZolin (ANCEF) 2-4 GM/100ML-% IVPB, Inject 100 mLs (2 g total) into the vein every Monday, Wednesday, and Friday at 8 PM for  12 doses. After dialysis, Disp: 1 each, Rfl: 0   ferric citrate (AURYXIA) 1 GM 210 MG(Fe) tablet, Take 210-630 mg by mouth See admin instructions. Take 3 tablets (630 mg) by mouth up to three times daily with meals, take 1 tablet (210 mg) with snacks, Disp: , Rfl:    glucose blood (ACCU-CHEK AVIVA PLUS) test strip, Use as instructed, Disp: 100 each, Rfl: 1   HYDROcodone-acetaminophen (NORCO) 10-325 MG tablet, Take 1 tablet by mouth every 8 (eight) hours as needed (pain)., Disp: 20 tablet, Rfl: 0   Lancet Devices (ACCU-CHEK SOFTCLIX) lancets, Use as instructed, Disp: 100 each, Rfl: 5   Lanthanum Carbonate (FOSRENOL) 1000 MG PACK, Take 1,000 mg by mouth 3 (three) times daily with meals. , Disp: , Rfl:    loratadine (CLARITIN) 10 MG tablet, Take 10 mg by mouth daily as needed for allergies., Disp: , Rfl:    Melatonin 5 MG CAPS, Take 10 mg by mouth at bedtime as needed (sleep)., Disp: , Rfl:    metroNIDAZOLE (FLAGYL) 500 MG tablet, Take 1 tablet (500 mg total) by mouth 3 (three) times daily for 28 days., Disp: 84 tablet, Rfl: 0    multivitamin (RENA-VIT) TABS tablet, Take 1 tablet by mouth See admin instructions. Take one tablet by mouth on Monday, Wednesday, Friday after dialysis, Disp: , Rfl:    OVER THE COUNTER MEDICATION, Take 1 Bottle by mouth 2 (two) times a day. Vegan Protein Shake, Disp: , Rfl:   Social History   Tobacco Use  Smoking Status Never Smoker  Smokeless Tobacco Never Used    Allergies  Allergen Reactions   Amoxicillin Itching    Did it involve swelling of the face/tongue/throat, SOB, or low BP? Unknown Did it involve sudden or severe rash/hives, skin peeling, or any reaction on the inside of your mouth or nose? Unknown Did you need to seek medical attention at a hospital or doctor's office? Unknown When did it last happen?20 years ago If all above answers are NO, may proceed with cephalosporin use.    Hydromorphone Itching    Patient states he may take with benadryl. Makes feet itch.   Percocet [Oxycodone-Acetaminophen] Itching and Other (See Comments)    Legs only. Itching and burning feeling.   Objective:   There were no vitals filed for this visit. There is no height or weight on file to calculate BMI. Constitutional Well developed. Well nourished.  Vascular Dorsalis pedis pulse palpable right. No cyanosis or clubbing noted. Pedal hair growth absent. Progressive gangrene   Neurologic Normal speech. Oriented to person, place, and time. Protective sensation absent  Dermatologic Right foot wound without warmth, erythema, or signs of acute infection.  Right fourth and fifth toes with progressive gangrene compared to prior plantar aspect of the wound without purulence and local signs of infection.  No significant healing since previous visit.    Orthopedic: Slight pain to palpation about the wound.   Radiographs: None today. Assessment:   1. Surgery follow-up   2. Diabetes mellitus type 2 with peripheral artery disease ([place])   3. ESRD (end stage renal disease) ([place])     Plan:  Patient was evaluated and treated and all questions answered.  Right foot wound status post right second and third toe amputation  - I had a lengthy discussion with patient that I agree with Dr. Dellia Nims findings that he may benefit from additional vascular eval.  I reviewed the vascular studies that he had previously.  I discussed with the patient  that he did have a short posterior tibial occlusion which did reconstitute and the posterior tibial tendon was patent at the ankle.  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  I discussed with patient that I do think he would benefit from at least evaluation by another vascular provider to see if they are interested in trying to improve his blood flow.  Ultimately, as I did discuss last visit I think that his issue was a vascular one; however, I am concerned about the microvasculature.  I think macrovascularly he does have some components of healing, although surely increasing the macrovascularture would be conducive to healing.  Ultimately, I think he will still benefit from transmetatarsal amputation in an attempt to prevent further or more proximal amputation.  We had lengthy discussion about discontinuing the wound VAC today as I do not think the wound VAC is going to provide much assistance as his wound is not being adequately perfused and tissue will not re-grow under the circumstances.  The patient is in agreement.  We discontinued and return to the wound VAC today.  The patients [employer] rep was present at the visit today and did accept the wound VAC for return.  Instead I applied hydrogel to the wound to moisten the wound bed as it was severely desiccated.  No hydrogel was applied to the fourth or fifth toes.  I discussed the patient that I will discuss with Dr. Dellia Nims his case and I do think he ultimately will benefit from vascular eval.  We  will discuss who he can see for evaluation although I allowed the patient for followup in one week for recheck.  Discussed that at this time though the foot does appear to be slow to heal due to vascular status, he does not have a sign of acute infection and no need for urgent surgical intervention.    Return in about 1 week (around 06/23/2019).

## 2019-06-21 ENCOUNTER — Telehealth: Payer: Self-pay | Admitting: *Deleted

## 2019-06-21 DIAGNOSIS — E1122 Type 2 diabetes mellitus with diabetic chronic kidney disease: Secondary | ICD-10-CM | POA: Diagnosis not present

## 2019-06-21 DIAGNOSIS — M86071 Acute hematogenous osteomyelitis, right ankle and foot: Secondary | ICD-10-CM | POA: Diagnosis not present

## 2019-06-21 DIAGNOSIS — E1129 Type 2 diabetes mellitus with other diabetic kidney complication: Secondary | ICD-10-CM | POA: Diagnosis not present

## 2019-06-21 DIAGNOSIS — D509 Iron deficiency anemia, unspecified: Secondary | ICD-10-CM | POA: Diagnosis not present

## 2019-06-21 DIAGNOSIS — E1169 Type 2 diabetes mellitus with other specified complication: Secondary | ICD-10-CM | POA: Diagnosis not present

## 2019-06-21 DIAGNOSIS — M869 Osteomyelitis, unspecified: Secondary | ICD-10-CM | POA: Diagnosis not present

## 2019-06-21 DIAGNOSIS — Z4800 Encounter for change or removal of nonsurgical wound dressing: Secondary | ICD-10-CM | POA: Diagnosis not present

## 2019-06-21 DIAGNOSIS — Z4781 Encounter for orthopedic aftercare following surgical amputation: Secondary | ICD-10-CM | POA: Diagnosis not present

## 2019-06-21 DIAGNOSIS — N2581 Secondary hyperparathyroidism of renal origin: Secondary | ICD-10-CM | POA: Diagnosis not present

## 2019-06-21 DIAGNOSIS — E1151 Type 2 diabetes mellitus with diabetic peripheral angiopathy without gangrene: Secondary | ICD-10-CM | POA: Diagnosis not present

## 2019-06-21 DIAGNOSIS — Z992 Dependence on renal dialysis: Secondary | ICD-10-CM | POA: Diagnosis not present

## 2019-06-21 DIAGNOSIS — D631 Anemia in chronic kidney disease: Secondary | ICD-10-CM | POA: Diagnosis not present

## 2019-06-21 DIAGNOSIS — N186 End stage renal disease: Secondary | ICD-10-CM | POA: Diagnosis not present

## 2019-06-21 NOTE — Telephone Encounter (Addendum)
Dr. March Rummage called states pt care may be transferred to [facility], unless pt requires more foot surgery. Letter faxed to [place] stating the above.

## 2019-06-21 NOTE — Telephone Encounter (Signed)
[place] states pt was referred from dialysis center, and is within the 90 day global period of surgery with Dr. March Rummage, they will need a letter from Dr. March Rummage transferring his care to [place].

## 2019-06-22 DIAGNOSIS — Z992 Dependence on renal dialysis: Secondary | ICD-10-CM | POA: Diagnosis not present

## 2019-06-22 DIAGNOSIS — N186 End stage renal disease: Secondary | ICD-10-CM | POA: Diagnosis not present

## 2019-06-22 DIAGNOSIS — N2581 Secondary hyperparathyroidism of renal origin: Secondary | ICD-10-CM | POA: Diagnosis not present

## 2019-06-22 DIAGNOSIS — D509 Iron deficiency anemia, unspecified: Secondary | ICD-10-CM | POA: Diagnosis not present

## 2019-06-22 DIAGNOSIS — M869 Osteomyelitis, unspecified: Secondary | ICD-10-CM | POA: Diagnosis not present

## 2019-06-22 DIAGNOSIS — D631 Anemia in chronic kidney disease: Secondary | ICD-10-CM | POA: Diagnosis not present

## 2019-06-23 ENCOUNTER — Encounter (HOSPITAL_BASED_OUTPATIENT_CLINIC_OR_DEPARTMENT_OTHER): Payer: Medicare Other | Attending: Internal Medicine

## 2019-06-23 ENCOUNTER — Ambulatory Visit (INDEPENDENT_AMBULATORY_CARE_PROVIDER_SITE_OTHER): Payer: Self-pay | Admitting: Podiatry

## 2019-06-23 ENCOUNTER — Other Ambulatory Visit: Payer: Self-pay

## 2019-06-23 VITALS — Temp 98.2°F

## 2019-06-23 DIAGNOSIS — M86671 Other chronic osteomyelitis, right ankle and foot: Secondary | ICD-10-CM

## 2019-06-23 DIAGNOSIS — N2581 Secondary hyperparathyroidism of renal origin: Secondary | ICD-10-CM

## 2019-06-23 DIAGNOSIS — T8189XA Other complications of procedures, not elsewhere classified, initial encounter: Secondary | ICD-10-CM | POA: Diagnosis not present

## 2019-06-23 DIAGNOSIS — D649 Anemia, unspecified: Secondary | ICD-10-CM | POA: Insufficient documentation

## 2019-06-23 DIAGNOSIS — E1151 Type 2 diabetes mellitus with diabetic peripheral angiopathy without gangrene: Secondary | ICD-10-CM | POA: Insufficient documentation

## 2019-06-23 DIAGNOSIS — E1169 Type 2 diabetes mellitus with other specified complication: Secondary | ICD-10-CM | POA: Insufficient documentation

## 2019-06-23 DIAGNOSIS — Z992 Dependence on renal dialysis: Secondary | ICD-10-CM | POA: Diagnosis not present

## 2019-06-23 DIAGNOSIS — E11621 Type 2 diabetes mellitus with foot ulcer: Secondary | ICD-10-CM | POA: Diagnosis not present

## 2019-06-23 DIAGNOSIS — E11319 Type 2 diabetes mellitus with unspecified diabetic retinopathy without macular edema: Secondary | ICD-10-CM

## 2019-06-23 DIAGNOSIS — H338 Other retinal detachments: Secondary | ICD-10-CM

## 2019-06-23 DIAGNOSIS — L97816 Non-pressure chronic ulcer of other part of right lower leg with bone involvement without evidence of necrosis: Secondary | ICD-10-CM | POA: Diagnosis not present

## 2019-06-23 DIAGNOSIS — Z09 Encounter for follow-up examination after completed treatment for conditions other than malignant neoplasm: Secondary | ICD-10-CM

## 2019-06-23 DIAGNOSIS — M86471 Chronic osteomyelitis with draining sinus, right ankle and foot: Secondary | ICD-10-CM | POA: Diagnosis not present

## 2019-06-23 HISTORY — DX: Type 2 diabetes mellitus with unspecified diabetic retinopathy without macular edema: E11.319

## 2019-06-23 HISTORY — DX: Anemia, unspecified: D64.9

## 2019-06-23 HISTORY — DX: Other retinal detachments: H33.8

## 2019-06-23 HISTORY — DX: Secondary hyperparathyroidism of renal origin: N25.81

## 2019-06-24 ENCOUNTER — Encounter: Payer: Self-pay | Admitting: Cardiovascular Disease

## 2019-06-24 DIAGNOSIS — M869 Osteomyelitis, unspecified: Secondary | ICD-10-CM | POA: Diagnosis not present

## 2019-06-24 DIAGNOSIS — N2581 Secondary hyperparathyroidism of renal origin: Secondary | ICD-10-CM | POA: Diagnosis not present

## 2019-06-24 DIAGNOSIS — Z992 Dependence on renal dialysis: Secondary | ICD-10-CM | POA: Diagnosis not present

## 2019-06-24 DIAGNOSIS — D631 Anemia in chronic kidney disease: Secondary | ICD-10-CM | POA: Diagnosis not present

## 2019-06-24 DIAGNOSIS — N186 End stage renal disease: Secondary | ICD-10-CM | POA: Diagnosis not present

## 2019-06-24 DIAGNOSIS — D509 Iron deficiency anemia, unspecified: Secondary | ICD-10-CM | POA: Diagnosis not present

## 2019-06-25 DIAGNOSIS — E1169 Type 2 diabetes mellitus with other specified complication: Secondary | ICD-10-CM | POA: Diagnosis not present

## 2019-06-25 DIAGNOSIS — E1151 Type 2 diabetes mellitus with diabetic peripheral angiopathy without gangrene: Secondary | ICD-10-CM | POA: Diagnosis not present

## 2019-06-25 DIAGNOSIS — E1122 Type 2 diabetes mellitus with diabetic chronic kidney disease: Secondary | ICD-10-CM | POA: Diagnosis not present

## 2019-06-25 DIAGNOSIS — M86071 Acute hematogenous osteomyelitis, right ankle and foot: Secondary | ICD-10-CM | POA: Diagnosis not present

## 2019-06-25 DIAGNOSIS — Z4800 Encounter for change or removal of nonsurgical wound dressing: Secondary | ICD-10-CM | POA: Diagnosis not present

## 2019-06-25 DIAGNOSIS — Z4781 Encounter for orthopedic aftercare following surgical amputation: Secondary | ICD-10-CM | POA: Diagnosis not present

## 2019-06-26 NOTE — Progress Notes (Signed)
Subjective:  Patient ID: [name], male    DOB: 16-Aug-1966,  MRN: [id number]  Chief Complaint  Patient presents with  . Wound Check    Right foot wound check. Pt states improvement. Denies fever/nausea/vomiting/chills. Pt denies drainage. Has no specific concerns.   Date of Service: 05/14/2019 Procedures:             1) right great toe bone biopsy             2) right great toe amputation interphalangeal joint  Date of Service: 05/24/2019 Procedures:             1) Incision and drainage of deep space abscess of the right foot             2) Right second toe amputation at the metatarsophalangeal joint  Date of Service: 05/25/2019 Procedures:             1) Incision and drainage of right foot infection, multiple bursa  Date of Service: 05/27/2019 Procedures:             1) Amputation right third toe metatarsophalangeal joint             2) Debridement irrigation of right foot down to bone             3) Application of wound VAC  53 y.o. male presents for wound care. States he is scheduled for an angiogram with anterograde access Tuesday. States he has come to terms with having a below knee amputation if the angiogram should not increase blood flow meaningfully.  Review of Systems: Negative except as noted in the HPI. Denies N/V/F/Ch.  Past Medical History:  Diagnosis Date  . Chills    at night - sometimes  . Complication of anesthesia   . Diabetes mellitus    controlled by diet  . 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 Eczema   . Fatigue    loss of fatigue  . Headache(784.0)    Years ago  . Heart murmur    years ago  . History of blood transfusion   . Hypertension    sees Dr. Jaci Standard  . MRSA (methicillin resistant staph aureus) culture positive   . PONV (postoperative nausea and vomiting)   . Poor circulation   . Renal disorder     Current Outpatient Medications:  .  acetaminophen (TYLENOL) 500 MG tablet, Take 1,000 mg by mouth every 6 (six) hours  as needed for moderate pain or headache., Disp: , Rfl:  .  blood glucose meter kit and supplies, Dispense based on patient and insurance preference. Use up to four times daily as directed. (FOR ICD-10 E10.9, E11.9)., Disp: 1 each, Rfl: 0 .  blood glucose meter kit and supplies, Dispense based on patient and insurance preference. Use up to four times daily as directed. (FOR ICD-9 250.00, 250.01)., Disp: 1 each, Rfl: 0 .  Continuous Blood Gluc Sensor (FREESTYLE LIBRE 14 DAY SENSOR) MISC, See admin instructions., Disp: , Rfl:  .  ferric citrate (AURYXIA) 1 GM 210 MG(Fe) tablet, Take 210-630 mg by mouth See admin instructions. Take 3 tablets (630 mg) by mouth up to three times daily with meals, take 1 tablet (210 mg) with snacks, Disp: , Rfl:  .  glucose blood (ACCU-CHEK AVIVA PLUS) test strip, Use as instructed, Disp: 100 each, Rfl: 1 .  HYDROcodone-acetaminophen (NORCO) 10-325 MG tablet, Take 1 tablet by mouth every 8 (eight) hours as needed (pain)., Disp: 20 tablet, Rfl:  0 .  HYDROmorphone (DILAUDID) 2 MG tablet, TAKE 1 TABLET BY MOUTH EVERY 4 HOURS AS NEEDED FOR SEVERE PAIN, Disp: , Rfl:  .  Lancet Devices (ACCU-CHEK SOFTCLIX) lancets, Use as instructed, Disp: 100 each, Rfl: 5 .  Lanthanum Carbonate (FOSRENOL) 1000 MG PACK, Take 1,000 mg by mouth 3 (three) times daily with meals. , Disp: , Rfl:  .  loratadine (CLARITIN) 10 MG tablet, Take 10 mg by mouth daily as needed for allergies., Disp: , Rfl:  .  Melatonin 5 MG CAPS, Take 10 mg by mouth at bedtime as needed (sleep)., Disp: , Rfl:  .  metroNIDAZOLE (FLAGYL) 500 MG tablet, Take 1 tablet (500 mg total) by mouth 3 (three) times daily for 28 days., Disp: 84 tablet, Rfl: 0 .  multivitamin (RENA-VIT) TABS tablet, Take 1 tablet by mouth See admin instructions. Take one tablet by mouth on Monday, Wednesday, Friday after dialysis, Disp: , Rfl:  .  OVER THE COUNTER MEDICATION, Take 1 Bottle by mouth 2 (two) times a day. Vegan Protein Shake, Disp: , Rfl:    Social History   Tobacco Use  Smoking Status Never Smoker  Smokeless Tobacco Never Used    Allergies  Allergen Reactions  . Amoxicillin Itching    Did it involve swelling of the face/tongue/throat, SOB, or low BP? Unknown Did it involve sudden or severe rash/hives, skin peeling, or any reaction on the inside of your mouth or nose? Unknown Did you need to seek medical attention at a hospital or doctor's office? Unknown When did it last happen?20 years ago If all above answers are "NO", may proceed with cephalosporin use.   Marland Kitchen Hydromorphone Itching    Patient states he may take with benadryl. Makes feet itch.  Marland Kitchen Percocet [Oxycodone-Acetaminophen] Itching and Other (See Comments)    Legs only. Itching and burning feeling.   Objective:   Vitals:   06/23/19 1113  Temp: 98.2 F (36.8 C)   There is no height or weight on file to calculate BMI. Constitutional Well developed. Well nourished.  Vascular Dorsalis pedis pulse palpable right. No cyanosis or clubbing noted. Pedal hair growth absent. Progressive gangrene   Neurologic Normal speech. Oriented to person, place, and time. Protective sensation absent  Dermatologic Right foot wound with exposed 2nd/3rd metatarsals, gangrenous 4th/5th toes.  Orthopedic: Slight pain to palpation about the wound.   Radiographs: None today. Assessment:   1. Surgery follow-up   2. Diabetes mellitus type 2 with peripheral artery disease ([place])   3. Chronic osteomyelitis of toe of right foot ([place])    Plan:  Patient was evaluated and treated and all questions answered.  Right foot wound status post right second and third toe amputation  - Will release patient to care with Dr. Dellia Nims at the [facility] ([address] - Pending vascular intervention by Drs. Berrey/Arrida. - F/u for surgical concerns should he wish to salvage part of his foot.  Left foot onychomycosis -Nails debrided x9 Return in about 9 weeks (around 08/25/2019) for Diabetic Foot Care.

## 2019-06-27 DIAGNOSIS — N2581 Secondary hyperparathyroidism of renal origin: Secondary | ICD-10-CM | POA: Diagnosis not present

## 2019-06-27 DIAGNOSIS — D509 Iron deficiency anemia, unspecified: Secondary | ICD-10-CM | POA: Diagnosis not present

## 2019-06-27 DIAGNOSIS — M869 Osteomyelitis, unspecified: Secondary | ICD-10-CM | POA: Diagnosis not present

## 2019-06-27 DIAGNOSIS — Z992 Dependence on renal dialysis: Secondary | ICD-10-CM | POA: Diagnosis not present

## 2019-06-27 DIAGNOSIS — N186 End stage renal disease: Secondary | ICD-10-CM | POA: Diagnosis not present

## 2019-06-27 DIAGNOSIS — D631 Anemia in chronic kidney disease: Secondary | ICD-10-CM | POA: Diagnosis not present

## 2019-06-28 ENCOUNTER — Ambulatory Visit (INDEPENDENT_AMBULATORY_CARE_PROVIDER_SITE_OTHER): Payer: Medicare Other | Admitting: Cardiovascular Disease

## 2019-06-28 ENCOUNTER — Other Ambulatory Visit: Payer: Self-pay

## 2019-06-28 ENCOUNTER — Telehealth: Payer: Self-pay | Admitting: Cardiovascular Disease

## 2019-06-28 ENCOUNTER — Encounter: Payer: Self-pay | Admitting: Cardiovascular Disease

## 2019-06-28 VITALS — BP 172/86 | HR 90 | Temp 97.2°F | Ht 70.0 in | Wt 213.5 lb

## 2019-06-28 DIAGNOSIS — I739 Peripheral vascular disease, unspecified: Secondary | ICD-10-CM | POA: Diagnosis not present

## 2019-06-28 DIAGNOSIS — E1122 Type 2 diabetes mellitus with diabetic chronic kidney disease: Secondary | ICD-10-CM | POA: Diagnosis not present

## 2019-06-28 DIAGNOSIS — M86071 Acute hematogenous osteomyelitis, right ankle and foot: Secondary | ICD-10-CM | POA: Diagnosis not present

## 2019-06-28 DIAGNOSIS — E785 Hyperlipidemia, unspecified: Secondary | ICD-10-CM

## 2019-06-28 DIAGNOSIS — Z4800 Encounter for change or removal of nonsurgical wound dressing: Secondary | ICD-10-CM | POA: Diagnosis not present

## 2019-06-28 DIAGNOSIS — Z4781 Encounter for orthopedic aftercare following surgical amputation: Secondary | ICD-10-CM | POA: Diagnosis not present

## 2019-06-28 DIAGNOSIS — Z01818 Encounter for other preprocedural examination: Secondary | ICD-10-CM

## 2019-06-28 DIAGNOSIS — E1151 Type 2 diabetes mellitus with diabetic peripheral angiopathy without gangrene: Secondary | ICD-10-CM | POA: Diagnosis not present

## 2019-06-28 DIAGNOSIS — E1169 Type 2 diabetes mellitus with other specified complication: Secondary | ICD-10-CM | POA: Diagnosis not present

## 2019-06-28 MED ORDER — ASPIRIN EC 81 MG PO TBEC: 81.0000 mg | DELAYED_RELEASE_TABLET | Freq: Every day | ORAL | 3 refills | Status: AC

## 2019-06-28 MED ORDER — ATORVASTATIN CALCIUM 40 MG PO TABS: 40.0000 mg | ORAL_TABLET | Freq: Every day | ORAL | 3 refills | Status: DC

## 2019-06-28 NOTE — Patient Instructions (Addendum)
Medication Instructions:   - START ASPIRIN  ( ENTERIC COATED ) 81 MG ONE TABLET DAILY - START TAKING ATORVASTATIN 40 MG  ONE TABLET AT BEDTIME   If you need a refill on your cardiac medications before your next appointment, please call your pharmacy.   Lab work: [place]  If you have labs (blood work) drawn today and your tests are completely normal, you will receive your results only by: Marland Kitchen MyChart Message (if you have MyChart) OR . A paper copy in the mail If you have any lab test that is abnormal or we need to change your treatment, we will call you to review the results.  Testing/Procedures: Will be schedule at [id number] . Your physician has requested that you have a peripheral vascular angiogram. This exam is performed at the hospital. During this exam IV contrast is used to look at arterial blood flow. Please review the information sheet given for details.  Follow-Up: At [facility], you and your health needs are our priority.  As part of our continuing mission to provide you with exceptional heart care, we have created designated Provider Care Teams.  These Care Teams include your primary Cardiologist (physician) and Advanced Practice Providers (APPs -  Physician Assistants and Nurse Practitioners) who all work together to provide you with the care you need, when you need it. You will need a follow up appointment in  [place].  Please call our office 2 months in advance to schedule this appointment.  You may see Dr [name] or one of the following Advanced Practice Providers on your designated Care Team:   Kerin Ransom, PA-C [place], [place] . Sande Rives, PA-C  Any Other Special Instructions Will Be Listed Below (If Applicable).      [place] [address] [place] [address] Dept: [phone] Loc: [place]  06/28/2019  You are scheduled for a Peripheral Angiogram on Wednesday, September 16 with Dr. Kathlyn Sacramento.  1. Please arrive at the [facility] (Main Entrance A) at [facility]: [address] [place], [place] [address] at 6:30 AM (This time is two hours before your procedure to ensure your preparation). Free valet parking service is available.   Special note: Every effort is made to have your procedure done on time. Please understand that emergencies sometimes delay scheduled procedures.  2. Diet: Do not eat solid foods after midnight.  The patient may have clear liquids until 5am upon the day of the procedure.  3. Labs: You will need to have blood drawn TODAY -- BMP,CBC  COVID TEST SCHEDULE ON 07/05/19 AT 1:40 PM - AT [place] 4. Medication instructions in preparation for your procedure:   On the morning of your procedure, take your Aspirin 81  and any morning medicines NOT listed above.  You may use sips of water.  5. Plan for one night stay--bring personal belongings. 6. Bring a current list of your medications and current insurance cards. 7. You MUST have a responsible person to drive you home. 8. Someone MUST be with you the first 24 hours after you arrive home or your discharge will be delayed. 9. Please wear clothes that are easy to get on and off and wear slip-on shoes.  Thank you for allowing [place] to care for you!   -- [facility]

## 2019-06-28 NOTE — Telephone Encounter (Signed)
New Message    Patient calling to find out about cholesterol.  States today the doctor told him it was high.  He would like to discuss the good and the bad of it so he can get it under control.  Please call patient back.

## 2019-06-28 NOTE — Telephone Encounter (Signed)
Spoke to patient - discuss medication aspirin and atorvastatin   patient also wanted to let Dr [name] be aware he is taking a medication over the counter called  Zymessec  - ( systemic enzyme)  Placed on medication list

## 2019-06-28 NOTE — Progress Notes (Signed)
Cardiology Office Note   Date:  06/28/2019   ID:  [name], DOB Jul 02, 1966, MRN [id number]  PCP:  Wellington Hampshire, MD  Cardiologist:   Kathlyn Sacramento, MD   Chief Complaint  Patient presents with   New Patient (Initial Visit)    referred by wound center. Meds reviewed verbally with paitent.       History of Present Illness: [name] is a 53 y.o. male who was referred by Dr. Dellia Nims for a second opinion regarding the management of peripheral arterial disease.  The patient has no prior cardiac history.  He has extensive medical problems including end-stage renal disease on hemodialysis Monday Wednesday and Friday for the last 8 to 9 years, diabetes mellitus, hypertension and hyperlipidemia.  He is not a smoker.  Family history is negative for coronary artery disease but his father did have a stroke. The patient cut his right toenail in May which led to bleeding followed by blister formation and then callus.  He remove the callus and developed an infection in that area.  He then saw Dr. March Rummage and was noted to have a black tip of the second toe.  Both second and third toe had to be resected but the patient did not heal the wound area.  He was seen by Dr. Oneida Alar for vascular evaluation.  He underwent an angiogram in July which showed no significant SFA or popliteal artery disease.  The right anterior and peroneal arteries were occluded.  The posterior tibial artery was patent with 90% stenosis in the midsegment.  The pedal arch reconstituted by the posterior tibial artery giving collaterals to the distal anterior tibial artery.  No revascularization was performed as it was felt that angioplasty of the posterior tibial artery would not increase the blood flow sufficiently enough to improve healing.  Wound care was advised with the possibility of amputation if healing does not happen.  The patient requested a second opinion and does not want to go through amputation at the present time if at  all possible.  He has no significant discomfort.    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 Jaci Standard   MRSA (methicillin resistant staph aureus) culture positive    PONV (postoperative nausea and vomiting)    Poor circulation    Renal disorder     Past Surgical History:  Procedure Laterality Date   ABDOMINAL AORTOGRAM N/A 05/17/2019   Procedure: ABDOMINAL AORTOGRAM;  Surgeon: Elam Dutch, MD;  Location: [place] [facility];  Service: Cardiovascular;  Laterality: N/A;   AMPUTATION TOE Right 05/14/2019   Procedure: PARTIAL AMPUTATION SECOND TOE RIGHT FOOT;  Surgeon: Evelina Bucy, DPM;  Location: [place];  Service: Podiatry;  Laterality: Right;   AMPUTATION TOE Right 05/23/2019   Procedure: AMPUTATION OF SECOND TOE METATARSAL PHALANGEAL JOINT;  Surgeon: Evelina Bucy, DPM;  Location: [place];  Service: Podiatry;  Laterality: Right;   AMPUTATION TOE Right 05/27/2019   Procedure: Amputation Toe;  Surgeon: Evelina Bucy, DPM;  Location: [place];  Service: Podiatry;  Laterality: Right;  right third toe   APPLICATION OF WOUND VAC Right 05/27/2019   Procedure: Application Of Wound Vac;  Surgeon: Evelina Bucy, DPM;  Location: [place];  Service: Podiatry;  Laterality: Right;   AV FISTULA PLACEMENT  06-18-11   Right brachiocephalic AVF   BONE BIOPSY Right 05/14/2019   Procedure: OPEN SUPERFICIAL BONE BIOPSY GREAT TOE;  Surgeon: Evelina Bucy, DPM;  Location: [place];  Service: Podiatry;  Laterality: Right;   EYE SURGERY     left eye for Laser, diabetic retinopathy   FISTULOGRAM Right 11/06/2011   Procedure: FISTULOGRAM;  Surgeon: Conrad Shirley, MD;   Location: [facility];  Service: Cardiovascular;  Laterality: Right;   HEMATOMA EVACUATION Right Feb. 25, 2014   HEMATOMA EVACUATION Right 12/14/2012   Procedure: EVACUATION HEMATOMA;  Surgeon: Conrad [place]field, MD;  Location: [place];  Service: Vascular;  Laterality: Right;   INCISION AND DRAINAGE Right 05/23/2019   Procedure: INCISION AND DRAINAGE OF RIGHT FOOT DEEP SPACE ABSCESS;  Surgeon: Evelina Bucy, DPM;  Location: [place];  Service: Podiatry;  Laterality: Right;   INSERTION OF DIALYSIS CATHETER  12/14/2012   Procedure: INSERTION OF DIALYSIS CATHETER;  Surgeon: Conrad [place], MD;  Location: [place];  Service: Vascular;;   IRRIGATION AND DEBRIDEMENT FOOT Right 05/25/2019   Procedure: INCISION AND DRAINAGE FOOT;  Surgeon: Evelina Bucy, DPM;  Location: [place];  Service: Podiatry;  Laterality: Right;   IRRIGATION AND DEBRIDEMENT FOOT Right 05/27/2019   Procedure: IRRIGATION AND DEBRIDEMENT FOOT;  Surgeon: Evelina Bucy, DPM;  Location: [place];  Service: Podiatry;  Laterality: Right;   LOWER EXTREMITY ANGIOGRAPHY Bilateral 05/17/2019   Procedure: LOWER EXTREMITY ANGIOGRAPHY;  Surgeon: Elam Dutch, MD;  Location: [place] [facility];  Service: Cardiovascular;  Laterality: Bilateral;   REVISON OF ARTERIOVENOUS FISTULA Right 12/14/2012   Procedure: REVISON OF upper arm ARTERIOVENOUS FISTULA using 44mx10cm gortex graft;  Surgeon: BConrad [place] MD;  Location: [facility];  Service: Vascular;  Laterality: Right;   SOFT TISSUE MASS EXCISION     Right arm, Left leg  for MRSA infection     Current Outpatient Medications  Medication Sig Dispense Refill   blood glucose meter kit and supplies Dispense based on patient and insurance preference. Use up to four times daily as directed. (FOR ICD-10 E10.9, E11.9). 1 each 0   blood glucose meter kit and supplies Dispense based on patient and insurance preference. Use up to four times daily as directed. (FOR ICD-9 250.00, 250.01). 1 each 0   Continuous Blood  Gluc Sensor (FREESTYLE LIBRE 14 DAY SENSOR) MISC See admin instructions.     ferric citrate (AURYXIA) 1 GM 210 MG(Fe) tablet Take 210-630 mg by mouth See admin instructions. Take 3 tablets (630 mg) by mouth up to three times daily with meals, take 1 tablet (210 mg) with snacks     glucose blood (ACCU-CHEK AVIVA PLUS) test strip Use as instructed 100 each 1   HYDROcodone-acetaminophen (NORCO) 10-325 MG tablet Take 1 tablet by mouth every 8 (eight) hours as needed (pain). 20 tablet 0   HYDROmorphone (DILAUDID) 2 MG tablet TAKE 1 TABLET BY MOUTH EVERY 4 HOURS AS NEEDED FOR SEVERE PAIN     Lancet Devices (ACCU-CHEK SOFTCLIX) lancets Use as instructed 100 each 5   Lanthanum Carbonate (FOSRENOL) 1000 MG PACK Take 1,000 mg by mouth 3 (three) times daily with meals.      loratadine (CLARITIN) 10  MG tablet Take 10 mg by mouth daily as needed for allergies.     Melatonin 5 MG CAPS Take 10 mg by mouth at bedtime as needed (sleep).     multivitamin (RENA-VIT) TABS tablet Take 1 tablet by mouth See admin instructions. Take one tablet by mouth on Monday, Wednesday, Friday after dialysis     OVER THE COUNTER MEDICATION Take 1 Bottle by mouth 2 (two) times a day. Vegan Protein Shake     No current facility-administered medications for this visit.     Allergies:   Amoxicillin, Hydromorphone, and Percocet [oxycodone-acetaminophen]    Social History:  The patient  reports that he has never smoked. He has never used smokeless tobacco. He reports that he does not drink alcohol or use drugs.   Family History:  The patient's family history includes Diabetes in his mother.    ROS:  Please see the history of present illness.   Otherwise, review of systems are positive for none.   All other systems are reviewed and negative.    PHYSICAL EXAM: VS:  BP (!) 172/86 (BP Location: Left Arm, Patient Position: Sitting, Cuff Size: Normal)    Pulse 90    Temp (!) 97.2 F (36.2 C)    Ht _0  (1.778 m)    Wt 213  lb 8 oz (96.8 kg)    BMI 30.63 kg/m  , BMI Body mass index is 30.63 kg/m. GEN: Well nourished, well developed, in no acute distress  HEENT: normal  Neck: no JVD, carotid bruits, or masses Cardiac: RRR; no rubs, or gallops, 2 out of 6 systolic murmur at the base. Respiratory:  clear to auscultation bilaterally, normal work of breathing GI: soft, nontender, nondistended, + BS MS: no deformity or atrophy  Skin: warm and dry, no rash Neuro:  Strength and sensation are intact Psych: euthymic mood, full affect Vascular: The patient has a fistula on the right arm for dialysis.  Femoral pulses normal bilaterally.  Distal pulses are not palpable.  I reviewed pictures of the wounds on his right lower extremity.  Both second and third toe were resected with an open area   EKG:  EKG is not ordered today.    Recent Labs: 05/16/2019: TSH 1.013 05/25/2019: Magnesium 2.5 05/30/2019: ALT 12; BUN 52; Creatinine, Ser 11.88; Hemoglobin 8.0; Platelets 352; Potassium 3.9; Sodium 136    Lipid Panel    Component Value Date/Time   CHOL 231 (H) 05/15/2019 0931   TRIG 236 (H) 05/15/2019 0931   HDL 43 05/15/2019 0931   CHOLHDL 5.4 05/15/2019 0931   VLDL 47 (H) 05/15/2019 0931   LDLCALC 141 (H) 05/15/2019 0931   LDLDIRECT 166.1 11/13/2008 1202      Wt Readings from Last 3 Encounters:  06/28/19 213 lb 8 oz (96.8 kg)  05/30/19 213 lb 6.5 oz (96.8 kg)  05/17/19 217 lb 2.5 oz (98.5 kg)       No flowsheet data found.    ASSESSMENT AND PLAN:  1.  Critical limb ischemia with gangrene and nonhealing surgical wound on the right foot: This is a limb threatening situation and given how deep the wound is, there is a possibility he might require below the knee amputation.  However, I do think it is worth attempting revascularization to give him at least a chance of healing.  The posterior tibial artery is approachable percutaneously.  Ideally, we should also attempt to open the anterior tibial artery which  will require retrograde access.  I discussed  with the patient that even with successful revascularization, we might not be able to salvage his right leg given other factors such as being on dialysis and also diabetic status.  The patient understands and he wants to proceed.  He understands all risks and benefits. Start aspirin 81 mg once daily.  2.  Hyperlipidemia: His LDL was 141.  Given diabetes status and peripheral arterial disease, I recommend treatment with a statin with a target LDL of less than 70.  I started atorvastatin 40 mg once daily.  3.  End-stage renal disease on hemodialysis on Monday Wednesday Friday.  I asked him to notify his dialysis center about his upcoming procedure.  His nephrologist is Dr. Hollie Salk.  4.  Elevated blood pressure: He reports that his blood pressure usually is not high.  He is currently not on antihypertensive medications.    Disposition:   FU with me in 1 month  Signed,  Kathlyn Sacramento, MD  06/28/2019 9:17 AM    [place]

## 2019-06-29 DIAGNOSIS — Z992 Dependence on renal dialysis: Secondary | ICD-10-CM | POA: Diagnosis not present

## 2019-06-29 DIAGNOSIS — D509 Iron deficiency anemia, unspecified: Secondary | ICD-10-CM | POA: Diagnosis not present

## 2019-06-29 DIAGNOSIS — N186 End stage renal disease: Secondary | ICD-10-CM | POA: Diagnosis not present

## 2019-06-29 DIAGNOSIS — M869 Osteomyelitis, unspecified: Secondary | ICD-10-CM | POA: Diagnosis not present

## 2019-06-29 DIAGNOSIS — N2581 Secondary hyperparathyroidism of renal origin: Secondary | ICD-10-CM | POA: Diagnosis not present

## 2019-06-29 DIAGNOSIS — D631 Anemia in chronic kidney disease: Secondary | ICD-10-CM | POA: Diagnosis not present

## 2019-06-29 LAB — BASIC METABOLIC PANEL
BUN/Creatinine Ratio: 3 — ABNORMAL LOW (ref 9–20)
BUN: 28 mg/dL — ABNORMAL HIGH (ref 6–24)
CO2: 26 mmol/L (ref 20–29)
Calcium: 9.2 mg/dL (ref 8.7–10.2)
Chloride: 95 mmol/L — ABNORMAL LOW (ref 96–106)
Creatinine, Ser: 8.91 mg/dL — ABNORMAL HIGH (ref 0.76–1.27)
GFR calc Af Amer: 7 mL/min/{1.73_m2} — ABNORMAL LOW (ref >59)
GFR calc non Af Amer: 6 mL/min/{1.73_m2} — ABNORMAL LOW (ref >59)
Glucose: 87 mg/dL (ref 65–99)
Potassium: 4.6 mmol/L (ref 3.5–5.2)
Sodium: 141 mmol/L (ref 134–144)

## 2019-06-29 LAB — CBC
Hematocrit: 27.8 % — ABNORMAL LOW (ref 37.5–51.0)
Hemoglobin: 8.6 g/dL — ABNORMAL LOW (ref 13.0–17.7)
MCH: 26.1 pg — ABNORMAL LOW (ref 26.6–33.0)
MCHC: 30.9 g/dL — ABNORMAL LOW (ref 31.5–35.7)
MCV: 85 fL (ref 79–97)
Platelets: 308 10*3/uL (ref 150–450)
RBC: 3.29 x10E6/uL — ABNORMAL LOW (ref 4.14–5.80)
RDW: 17.3 % — ABNORMAL HIGH (ref 11.6–15.4)
WBC: 5.9 10*3/uL (ref 3.4–10.8)

## 2019-06-30 DIAGNOSIS — L97512 Non-pressure chronic ulcer of other part of right foot with fat layer exposed: Secondary | ICD-10-CM | POA: Diagnosis not present

## 2019-06-30 DIAGNOSIS — I739 Peripheral vascular disease, unspecified: Secondary | ICD-10-CM | POA: Diagnosis not present

## 2019-06-30 DIAGNOSIS — I10 Essential (primary) hypertension: Secondary | ICD-10-CM | POA: Diagnosis not present

## 2019-06-30 DIAGNOSIS — E785 Hyperlipidemia, unspecified: Secondary | ICD-10-CM | POA: Diagnosis not present

## 2019-06-30 DIAGNOSIS — E11622 Type 2 diabetes mellitus with other skin ulcer: Secondary | ICD-10-CM | POA: Diagnosis not present

## 2019-07-01 ENCOUNTER — Telehealth: Payer: Self-pay | Admitting: Cardiovascular Disease

## 2019-07-01 DIAGNOSIS — Z992 Dependence on renal dialysis: Secondary | ICD-10-CM | POA: Diagnosis not present

## 2019-07-01 DIAGNOSIS — D631 Anemia in chronic kidney disease: Secondary | ICD-10-CM | POA: Diagnosis not present

## 2019-07-01 DIAGNOSIS — M869 Osteomyelitis, unspecified: Secondary | ICD-10-CM | POA: Diagnosis not present

## 2019-07-01 DIAGNOSIS — D509 Iron deficiency anemia, unspecified: Secondary | ICD-10-CM | POA: Diagnosis not present

## 2019-07-01 DIAGNOSIS — N2581 Secondary hyperparathyroidism of renal origin: Secondary | ICD-10-CM | POA: Diagnosis not present

## 2019-07-01 DIAGNOSIS — N186 End stage renal disease: Secondary | ICD-10-CM | POA: Diagnosis not present

## 2019-07-01 NOTE — Telephone Encounter (Signed)
Patient called to cancel his cardiac cath that is scheduled for 9/16. Does want to reschedule at this time.

## 2019-07-01 NOTE — Telephone Encounter (Signed)
Call returned to the patient. He stated that he has decided to get care at [facility] and will no longer need services with [facility]. The procedure on 9/16 has been canceled with Dr. [name].

## 2019-07-02 DIAGNOSIS — Z4800 Encounter for change or removal of nonsurgical wound dressing: Secondary | ICD-10-CM | POA: Diagnosis not present

## 2019-07-02 DIAGNOSIS — E1122 Type 2 diabetes mellitus with diabetic chronic kidney disease: Secondary | ICD-10-CM | POA: Diagnosis not present

## 2019-07-02 DIAGNOSIS — E1151 Type 2 diabetes mellitus with diabetic peripheral angiopathy without gangrene: Secondary | ICD-10-CM | POA: Diagnosis not present

## 2019-07-02 DIAGNOSIS — E1169 Type 2 diabetes mellitus with other specified complication: Secondary | ICD-10-CM | POA: Diagnosis not present

## 2019-07-02 DIAGNOSIS — M86071 Acute hematogenous osteomyelitis, right ankle and foot: Secondary | ICD-10-CM | POA: Diagnosis not present

## 2019-07-02 DIAGNOSIS — Z4781 Encounter for orthopedic aftercare following surgical amputation: Secondary | ICD-10-CM | POA: Diagnosis not present

## 2019-07-04 DIAGNOSIS — M869 Osteomyelitis, unspecified: Secondary | ICD-10-CM | POA: Diagnosis not present

## 2019-07-04 DIAGNOSIS — D509 Iron deficiency anemia, unspecified: Secondary | ICD-10-CM | POA: Diagnosis not present

## 2019-07-04 DIAGNOSIS — Z992 Dependence on renal dialysis: Secondary | ICD-10-CM | POA: Diagnosis not present

## 2019-07-04 DIAGNOSIS — N186 End stage renal disease: Secondary | ICD-10-CM | POA: Diagnosis not present

## 2019-07-04 DIAGNOSIS — N2581 Secondary hyperparathyroidism of renal origin: Secondary | ICD-10-CM | POA: Diagnosis not present

## 2019-07-04 DIAGNOSIS — D631 Anemia in chronic kidney disease: Secondary | ICD-10-CM | POA: Diagnosis not present

## 2019-07-05 ENCOUNTER — Other Ambulatory Visit (HOSPITAL_COMMUNITY): Payer: Medicare Other

## 2019-07-05 DIAGNOSIS — E1169 Type 2 diabetes mellitus with other specified complication: Secondary | ICD-10-CM | POA: Diagnosis not present

## 2019-07-05 DIAGNOSIS — E1122 Type 2 diabetes mellitus with diabetic chronic kidney disease: Secondary | ICD-10-CM | POA: Diagnosis not present

## 2019-07-05 DIAGNOSIS — E1151 Type 2 diabetes mellitus with diabetic peripheral angiopathy without gangrene: Secondary | ICD-10-CM | POA: Diagnosis not present

## 2019-07-05 DIAGNOSIS — M869 Osteomyelitis, unspecified: Secondary | ICD-10-CM | POA: Diagnosis not present

## 2019-07-05 DIAGNOSIS — Z992 Dependence on renal dialysis: Secondary | ICD-10-CM | POA: Diagnosis not present

## 2019-07-05 DIAGNOSIS — N186 End stage renal disease: Secondary | ICD-10-CM | POA: Diagnosis not present

## 2019-07-05 DIAGNOSIS — D631 Anemia in chronic kidney disease: Secondary | ICD-10-CM | POA: Diagnosis not present

## 2019-07-05 DIAGNOSIS — Z4781 Encounter for orthopedic aftercare following surgical amputation: Secondary | ICD-10-CM | POA: Diagnosis not present

## 2019-07-05 DIAGNOSIS — D509 Iron deficiency anemia, unspecified: Secondary | ICD-10-CM | POA: Diagnosis not present

## 2019-07-05 DIAGNOSIS — M86071 Acute hematogenous osteomyelitis, right ankle and foot: Secondary | ICD-10-CM | POA: Diagnosis not present

## 2019-07-05 DIAGNOSIS — N2581 Secondary hyperparathyroidism of renal origin: Secondary | ICD-10-CM | POA: Diagnosis not present

## 2019-07-05 DIAGNOSIS — Z4800 Encounter for change or removal of nonsurgical wound dressing: Secondary | ICD-10-CM | POA: Diagnosis not present

## 2019-07-06 ENCOUNTER — Ambulatory Visit (HOSPITAL_COMMUNITY): Admission: RE | Admit: 2019-07-06 | Payer: 59 | Source: Home / Self Care | Admitting: Cardiovascular Disease

## 2019-07-06 ENCOUNTER — Encounter (HOSPITAL_COMMUNITY): Admission: RE | Payer: Self-pay | Source: Home / Self Care

## 2019-07-06 DIAGNOSIS — E785 Hyperlipidemia, unspecified: Secondary | ICD-10-CM | POA: Diagnosis not present

## 2019-07-06 DIAGNOSIS — Z20828 Contact with and (suspected) exposure to other viral communicable diseases: Secondary | ICD-10-CM | POA: Diagnosis not present

## 2019-07-06 DIAGNOSIS — I998 Other disorder of circulatory system: Secondary | ICD-10-CM | POA: Diagnosis not present

## 2019-07-06 DIAGNOSIS — D631 Anemia in chronic kidney disease: Secondary | ICD-10-CM | POA: Diagnosis not present

## 2019-07-06 DIAGNOSIS — I70261 Atherosclerosis of native arteries of extremities with gangrene, right leg: Secondary | ICD-10-CM | POA: Diagnosis not present

## 2019-07-06 DIAGNOSIS — E11622 Type 2 diabetes mellitus with other skin ulcer: Secondary | ICD-10-CM | POA: Diagnosis not present

## 2019-07-06 DIAGNOSIS — I739 Peripheral vascular disease, unspecified: Secondary | ICD-10-CM | POA: Diagnosis not present

## 2019-07-06 DIAGNOSIS — Z9115 Patient's noncompliance with renal dialysis: Secondary | ICD-10-CM | POA: Diagnosis not present

## 2019-07-06 DIAGNOSIS — N250 Renal osteodystrophy: Secondary | ICD-10-CM | POA: Diagnosis not present

## 2019-07-06 DIAGNOSIS — I96 Gangrene, not elsewhere classified: Secondary | ICD-10-CM | POA: Diagnosis not present

## 2019-07-06 DIAGNOSIS — Z79899 Other long term (current) drug therapy: Secondary | ICD-10-CM | POA: Diagnosis not present

## 2019-07-06 DIAGNOSIS — E11621 Type 2 diabetes mellitus with foot ulcer: Secondary | ICD-10-CM | POA: Diagnosis not present

## 2019-07-06 DIAGNOSIS — Z992 Dependence on renal dialysis: Secondary | ICD-10-CM | POA: Diagnosis not present

## 2019-07-06 DIAGNOSIS — M86171 Other acute osteomyelitis, right ankle and foot: Secondary | ICD-10-CM | POA: Diagnosis not present

## 2019-07-06 DIAGNOSIS — I120 Hypertensive chronic kidney disease with stage 5 chronic kidney disease or end stage renal disease: Secondary | ICD-10-CM | POA: Diagnosis not present

## 2019-07-06 DIAGNOSIS — D62 Acute posthemorrhagic anemia: Secondary | ICD-10-CM | POA: Diagnosis not present

## 2019-07-06 DIAGNOSIS — Z7982 Long term (current) use of aspirin: Secondary | ICD-10-CM | POA: Diagnosis not present

## 2019-07-06 DIAGNOSIS — N186 End stage renal disease: Secondary | ICD-10-CM | POA: Diagnosis not present

## 2019-07-06 DIAGNOSIS — E1169 Type 2 diabetes mellitus with other specified complication: Secondary | ICD-10-CM | POA: Diagnosis not present

## 2019-07-06 DIAGNOSIS — G8918 Other acute postprocedural pain: Secondary | ICD-10-CM | POA: Diagnosis not present

## 2019-07-06 DIAGNOSIS — E11628 Type 2 diabetes mellitus with other skin complications: Secondary | ICD-10-CM | POA: Diagnosis not present

## 2019-07-06 DIAGNOSIS — E1152 Type 2 diabetes mellitus with diabetic peripheral angiopathy with gangrene: Secondary | ICD-10-CM | POA: Diagnosis not present

## 2019-07-06 DIAGNOSIS — M96810 Intraoperative hemorrhage and hematoma of a musculoskeletal structure complicating a musculoskeletal system procedure: Secondary | ICD-10-CM | POA: Diagnosis not present

## 2019-07-06 DIAGNOSIS — L97512 Non-pressure chronic ulcer of other part of right foot with fat layer exposed: Secondary | ICD-10-CM | POA: Diagnosis not present

## 2019-07-06 DIAGNOSIS — Z880 Allergy status to penicillin: Secondary | ICD-10-CM | POA: Diagnosis not present

## 2019-07-06 DIAGNOSIS — E1122 Type 2 diabetes mellitus with diabetic chronic kidney disease: Secondary | ICD-10-CM | POA: Diagnosis not present

## 2019-07-06 DIAGNOSIS — Z89421 Acquired absence of other right toe(s): Secondary | ICD-10-CM | POA: Diagnosis not present

## 2019-07-06 SURGERY — LOWER EXTREMITY ANGIOGRAPHY
Anesthesia: LOCAL | Laterality: Right

## 2019-07-12 DIAGNOSIS — E46 Unspecified protein-calorie malnutrition: Secondary | ICD-10-CM | POA: Insufficient documentation

## 2019-07-13 MED ORDER — Medication
1.00 | Status: DC
Start: ? — End: 2019-07-13

## 2019-07-13 MED ORDER — Medication
Status: DC
Start: ? — End: 2019-07-13

## 2019-07-13 MED ORDER — UPSPRINGBABY MULTIVITAMIN/IRON PO LIQD
6000.00 | ORAL | Status: DC
Start: ? — End: 2019-07-13

## 2019-07-13 MED ORDER — Medication
5.00 | Status: DC
Start: ? — End: 2019-07-13

## 2019-07-13 MED ORDER — EQUATE NICOTINE 4 MG MT GUM
4.00 | CHEWING_GUM | OROMUCOSAL | Status: DC
Start: ? — End: 2019-07-13

## 2019-07-13 MED ORDER — PROMETHAZINE HCL (BULK CHEMICALS - P'S)
25.00 | Status: DC
Start: ? — End: 2019-07-13

## 2019-07-13 MED ORDER — LOVENOX 150 MG/ML ~~LOC~~ SOLN
0.50 | SUBCUTANEOUS | Status: DC
Start: ? — End: 2019-07-13

## 2019-07-13 MED ORDER — TUSSI PRES-B 2-15-200 MG/5ML PO LIQD
0.50 | ORAL | Status: DC
Start: ? — End: 2019-07-13

## 2019-07-13 MED ORDER — SUBDUE PLUS PO LIQD
75.00 | ORAL | Status: DC
Start: 2019-07-14 — End: 2019-07-13

## 2019-07-13 MED ORDER — NUTREN RENAL PO LIQD
2400.00 | ORAL | Status: DC
Start: 2019-07-13 — End: 2019-07-13

## 2019-07-13 MED ORDER — CVS KIDPANT BOYS X-LARGE MISC
40.00 | Status: DC
Start: 2019-07-13 — End: 2019-07-13

## 2019-07-13 MED ORDER — PHENYLEPHRINE-GUAIFENESIN 20-375 MG PO CP12
10.00 | ORAL_CAPSULE | ORAL | Status: DC
Start: ? — End: 2019-07-13

## 2019-07-13 MED ORDER — PHENYLEPH-POT GUAIACOLSULF
81.00 | Status: DC
Start: 2019-07-13 — End: 2019-07-13

## 2019-07-13 MED ORDER — Medication
2.00 | Status: DC
Start: ? — End: 2019-07-13

## 2019-07-13 MED ORDER — Medication
3.38 | Status: DC
Start: 2019-07-13 — End: 2019-07-13

## 2019-07-13 MED ORDER — QUINERVA 260 MG PO TABS
650.00 | ORAL_TABLET | ORAL | Status: DC
Start: ? — End: 2019-07-13

## 2019-07-15 DIAGNOSIS — Z7982 Long term (current) use of aspirin: Secondary | ICD-10-CM | POA: Diagnosis not present

## 2019-07-15 DIAGNOSIS — M869 Osteomyelitis, unspecified: Secondary | ICD-10-CM | POA: Diagnosis not present

## 2019-07-15 DIAGNOSIS — N2581 Secondary hyperparathyroidism of renal origin: Secondary | ICD-10-CM | POA: Diagnosis not present

## 2019-07-15 DIAGNOSIS — D631 Anemia in chronic kidney disease: Secondary | ICD-10-CM | POA: Diagnosis not present

## 2019-07-15 DIAGNOSIS — D509 Iron deficiency anemia, unspecified: Secondary | ICD-10-CM | POA: Diagnosis not present

## 2019-07-15 DIAGNOSIS — Z7902 Long term (current) use of antithrombotics/antiplatelets: Secondary | ICD-10-CM | POA: Diagnosis not present

## 2019-07-15 DIAGNOSIS — E1151 Type 2 diabetes mellitus with diabetic peripheral angiopathy without gangrene: Secondary | ICD-10-CM | POA: Diagnosis not present

## 2019-07-15 DIAGNOSIS — Z992 Dependence on renal dialysis: Secondary | ICD-10-CM | POA: Diagnosis not present

## 2019-07-15 DIAGNOSIS — I120 Hypertensive chronic kidney disease with stage 5 chronic kidney disease or end stage renal disease: Secondary | ICD-10-CM | POA: Diagnosis not present

## 2019-07-15 DIAGNOSIS — E1122 Type 2 diabetes mellitus with diabetic chronic kidney disease: Secondary | ICD-10-CM | POA: Diagnosis not present

## 2019-07-15 DIAGNOSIS — Z4781 Encounter for orthopedic aftercare following surgical amputation: Secondary | ICD-10-CM | POA: Diagnosis not present

## 2019-07-15 DIAGNOSIS — Z79891 Long term (current) use of opiate analgesic: Secondary | ICD-10-CM | POA: Diagnosis not present

## 2019-07-15 DIAGNOSIS — Z89421 Acquired absence of other right toe(s): Secondary | ICD-10-CM | POA: Diagnosis not present

## 2019-07-15 DIAGNOSIS — Z792 Long term (current) use of antibiotics: Secondary | ICD-10-CM | POA: Diagnosis not present

## 2019-07-15 DIAGNOSIS — N186 End stage renal disease: Secondary | ICD-10-CM | POA: Diagnosis not present

## 2019-07-15 DIAGNOSIS — Z9181 History of falling: Secondary | ICD-10-CM | POA: Diagnosis not present

## 2019-07-15 NOTE — Progress Notes (Signed)
Patient is here today for postoperative dressing change.  Removed soiled bandages, applied dry sterile bandage.  He is to keep his current postop appointment.

## 2019-07-18 DIAGNOSIS — Z4781 Encounter for orthopedic aftercare following surgical amputation: Secondary | ICD-10-CM | POA: Diagnosis not present

## 2019-07-18 DIAGNOSIS — I120 Hypertensive chronic kidney disease with stage 5 chronic kidney disease or end stage renal disease: Secondary | ICD-10-CM | POA: Diagnosis not present

## 2019-07-18 DIAGNOSIS — N186 End stage renal disease: Secondary | ICD-10-CM | POA: Diagnosis not present

## 2019-07-18 DIAGNOSIS — M869 Osteomyelitis, unspecified: Secondary | ICD-10-CM | POA: Diagnosis not present

## 2019-07-18 DIAGNOSIS — E1151 Type 2 diabetes mellitus with diabetic peripheral angiopathy without gangrene: Secondary | ICD-10-CM | POA: Diagnosis not present

## 2019-07-18 DIAGNOSIS — E1122 Type 2 diabetes mellitus with diabetic chronic kidney disease: Secondary | ICD-10-CM | POA: Diagnosis not present

## 2019-07-18 DIAGNOSIS — D509 Iron deficiency anemia, unspecified: Secondary | ICD-10-CM | POA: Diagnosis not present

## 2019-07-18 DIAGNOSIS — Z992 Dependence on renal dialysis: Secondary | ICD-10-CM | POA: Diagnosis not present

## 2019-07-18 DIAGNOSIS — N2581 Secondary hyperparathyroidism of renal origin: Secondary | ICD-10-CM | POA: Diagnosis not present

## 2019-07-18 DIAGNOSIS — D631 Anemia in chronic kidney disease: Secondary | ICD-10-CM | POA: Diagnosis not present

## 2019-07-20 ENCOUNTER — Other Ambulatory Visit: Payer: Self-pay

## 2019-07-20 ENCOUNTER — Ambulatory Visit (INDEPENDENT_AMBULATORY_CARE_PROVIDER_SITE_OTHER): Payer: Medicare Other | Admitting: Cardiology

## 2019-07-20 ENCOUNTER — Encounter: Payer: Self-pay | Admitting: Cardiology

## 2019-07-20 ENCOUNTER — Encounter: Payer: Self-pay | Admitting: *Deleted

## 2019-07-20 VITALS — BP 148/80 | HR 89 | Ht 70.0 in | Wt 207.0 lb

## 2019-07-20 DIAGNOSIS — M869 Osteomyelitis, unspecified: Secondary | ICD-10-CM | POA: Diagnosis not present

## 2019-07-20 DIAGNOSIS — N186 End stage renal disease: Secondary | ICD-10-CM

## 2019-07-20 DIAGNOSIS — D509 Iron deficiency anemia, unspecified: Secondary | ICD-10-CM | POA: Diagnosis not present

## 2019-07-20 DIAGNOSIS — E1151 Type 2 diabetes mellitus with diabetic peripheral angiopathy without gangrene: Secondary | ICD-10-CM | POA: Diagnosis not present

## 2019-07-20 DIAGNOSIS — R079 Chest pain, unspecified: Secondary | ICD-10-CM | POA: Diagnosis not present

## 2019-07-20 DIAGNOSIS — N2581 Secondary hyperparathyroidism of renal origin: Secondary | ICD-10-CM | POA: Diagnosis not present

## 2019-07-20 DIAGNOSIS — I10 Essential (primary) hypertension: Secondary | ICD-10-CM

## 2019-07-20 DIAGNOSIS — Z4781 Encounter for orthopedic aftercare following surgical amputation: Secondary | ICD-10-CM | POA: Diagnosis not present

## 2019-07-20 DIAGNOSIS — D631 Anemia in chronic kidney disease: Secondary | ICD-10-CM | POA: Diagnosis not present

## 2019-07-20 DIAGNOSIS — I739 Peripheral vascular disease, unspecified: Secondary | ICD-10-CM

## 2019-07-20 DIAGNOSIS — E785 Hyperlipidemia, unspecified: Secondary | ICD-10-CM

## 2019-07-20 DIAGNOSIS — E119 Type 2 diabetes mellitus without complications: Secondary | ICD-10-CM

## 2019-07-20 DIAGNOSIS — R0789 Other chest pain: Secondary | ICD-10-CM

## 2019-07-20 DIAGNOSIS — I120 Hypertensive chronic kidney disease with stage 5 chronic kidney disease or end stage renal disease: Secondary | ICD-10-CM | POA: Diagnosis not present

## 2019-07-20 DIAGNOSIS — E1122 Type 2 diabetes mellitus with diabetic chronic kidney disease: Secondary | ICD-10-CM | POA: Diagnosis not present

## 2019-07-20 DIAGNOSIS — Z992 Dependence on renal dialysis: Secondary | ICD-10-CM | POA: Diagnosis not present

## 2019-07-20 NOTE — Progress Notes (Signed)
Cardiology Office Note:    Date:  07/20/2019   ID:  [name], DOB Jun 12, 1966, MRN [id number]  PCP:  Wellington Hampshire, MD  Cardiologist:  Candee Furbish, MD  Electrophysiologist:  None   Referring MD: Donato Heinz, MD     History of Present Illness:    [name] is a 53 y.o. male here for the evaluation of chest pain and palpitations at the request of Dr. Marval Regal.   He was recently seen on 06/28/2019 by Dr. [name] in our practice for peripheral arterial disease.  He has end-stage renal disease on hemodialysis Monday Wednesday Friday for the last 9 years diabetes with hypertension and hyperlipidemia, non-smoker with family history is negative for CAD but his father had a stroke.  Toe infection.  Angiogram in July showed no significant SFA or popliteal disease.  The right anterior and peroneal arteries were occluded.  It was felt that revascularization of the posterior tibial artery which was 90% would be insufficient.  He saw Dr. Velva Harman for possible second opinion and the approach was to try to open the anterior tibial the retrograde access.  He then called and canceled the procedure.  He called and decided get his care done at [facility] and stated that he no longer needs the services with heart care.  The procedure was canceled.  [facility] vascular team was able to open up the anterior lower extremity vessels he states.  He still quite upset about his previous amputations of his toes.  Chest pain occurred with some exertion, he thought maybe it was trapped gas.  Felt as though he was breathing in cold air, moderate in intensity type sensation.  Has been feeling better.  Because of his other vascular disease, he was asked to come back here for evaluation.  I saw him last in 2014 where a stress test was performed and was low risk but no ischemia.  Non-smoker, no early family history of CAD Takes atorvastatin and aspirin.  Denies any fevers chills nausea vomiting syncope bleeding   Past Medical History:  Diagnosis Date  . Abscess of right foot   . Acute osteomyelitis of right foot ([place]) 05/15/2019  . Anemia 06/23/2019  . Cellulitis of right foot 05/22/2019  . Chest pain 08/23/2013  . Chills    at night - sometimes  . Chronic kidney disease, stage V ([place]) 08/23/2013  . Complication of anesthesia   . Diabetes mellitus    controlled by diet  . 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 Diabetic foot ulcer- right second toe 05/14/2019  . Diabetic infection of right foot ([place])   . Diabetic retinopathy associated with type 2 diabetes mellitus ([place]) 06/23/2019  . Eczema   . ESRD on dialysis ([place]) 07/18/2011  . Essential hypertension 10/18/2007   Qualifier: Diagnosis of  By: Loanne Drilling MD, Jacelyn Pi   . Fatigue    loss of fatigue  . FOOT ULCER, LEFT 06/29/2008   Qualifier: Diagnosis of  By: Loanne Drilling MD, Sean A   . Gangrene of toe of right foot ([place])   . Headache(784.0)    Years ago  . Heart murmur    years ago  . Hemodialysis status ([place]) 08/23/2013  . History of blood transfusion   . Hyperglycemia 05/05/2015  . HYPERLIPIDEMIA 05/16/2008   Qualifier: Diagnosis of  By: Jenny Reichmann MD, Hunt Oris   . Hypertension    sees Dr. Jaci Standard  . Hypoglycemia, unspecified 11/13/2008   Qualifier: Diagnosis of  By: Jenny Reichmann MD,  Hunt Oris   . MRSA (methicillin resistant staph aureus) culture positive   . MRSA INFECTION 10/18/2007   Qualifier: Diagnosis of  By: Marca Ancona RMA, Lucy    . Obesity, unspecified 08/23/2013  . Osteomyelitis of great toe of right foot ([place]) 05/14/2019  . Other forms of retinal detachment(361.89) 06/23/2019  . PONV (postoperative nausea and vomiting)   . Poor circulation   . Renal disorder   . RENAL INSUFFICIENCY 07/31/2008   Qualifier: Diagnosis of  By: Jenny Reichmann MD, Hunt Oris   . Secondary hyperparathyroidism ([place]) 06/23/2019  . Sepsis ([place]) 05/14/2019  . Type 2 diabetes mellitus ([place]) 08/23/2013  . Ulcer of right foot, with necrosis of bone ([place])   . Ulcer of right second toe with  necrosis of bone [facility])     Past Surgical History:  Procedure Laterality Date  . ABDOMINAL AORTOGRAM N/A 05/17/2019   Procedure: ABDOMINAL AORTOGRAM;  Surgeon: Elam Dutch, MD;  Location: [place] [facility];  Service: Cardiovascular;  Laterality: N/A;  . AMPUTATION TOE Right 05/14/2019   Procedure: PARTIAL AMPUTATION SECOND TOE RIGHT FOOT;  Surgeon: Evelina Bucy, DPM;  Location: [place];  Service: Podiatry;  Laterality: Right;  . AMPUTATION TOE Right 05/23/2019   Procedure: AMPUTATION OF SECOND TOE METATARSAL PHALANGEAL JOINT;  Surgeon: Evelina Bucy, DPM;  Location: [place];  Service: Podiatry;  Laterality: Right;  . AMPUTATION TOE Right 05/27/2019   Procedure: Amputation Toe;  Surgeon: Evelina Bucy, DPM;  Location: [place];  Service: Podiatry;  Laterality: Right;  right third toe  . APPLICATION OF WOUND VAC Right 05/27/2019   Procedure: Application Of Wound Vac;  Surgeon: Evelina Bucy, DPM;  Location: [place];  Service: Podiatry;  Laterality: Right;  . AV FISTULA PLACEMENT  06-18-11   Right brachiocephalic AVF  . BONE BIOPSY Right 05/14/2019   Procedure: OPEN SUPERFICIAL BONE BIOPSY GREAT TOE;  Surgeon: Evelina Bucy, DPM;  Location: [place];  Service: Podiatry;  Laterality: Right;  . EYE SURGERY     left eye for Laser, diabetic retinopathy  . FISTULOGRAM Right 11/06/2011   Procedure: FISTULOGRAM;  Surgeon: Conrad [place]ville, MD;  Location: [facility] C[facility];  Service: Cardiovascular;  Laterality: Right;  . HEMATOMA EVACUATION Right Feb. 25, 2014  . HEMATOMA EVACUATION Right 12/14/2012   Procedure: EVACUATION HEMATOMA;  Surgeon: Conrad Clifton, MD;  Location: [place];  Service: Vascular;  Laterality: Right;  . INCISION AND DRAINAGE Right 05/23/2019   Procedure: INCISION AND DRAINAGE OF RIGHT FOOT DEEP SPACE ABSCESS;  Surgeon: Evelina Bucy, DPM;  Location: [place];  Service: Podiatry;  Laterality: Right;  . INSERTION OF DIALYSIS CATHETER  12/14/2012   Procedure: INSERTION OF DIALYSIS CATHETER;   Surgeon: Conrad [place], MD;  Location: [place];  Service: Vascular;;  . IRRIGATION AND DEBRIDEMENT FOOT Right 05/25/2019   Procedure: INCISION AND DRAINAGE FOOT;  Surgeon: Evelina Bucy, DPM;  Location: [place];  Service: Podiatry;  Laterality: Right;  . IRRIGATION AND DEBRIDEMENT FOOT Right 05/27/2019   Procedure: IRRIGATION AND DEBRIDEMENT FOOT;  Surgeon: Evelina Bucy, DPM;  Location: [place];  Service: Podiatry;  Laterality: Right;  . LOWER EXTREMITY ANGIOGRAPHY Bilateral 05/17/2019   Procedure: LOWER EXTREMITY ANGIOGRAPHY;  Surgeon: Elam Dutch, MD;  Location: [place] [facility];  Service: Cardiovascular;  Laterality: Bilateral;  . REVISON OF ARTERIOVENOUS FISTULA Right 12/14/2012   Procedure: REVISON OF upper arm ARTERIOVENOUS FISTULA using 17mx10cm gortex graft;  Surgeon: BConrad Wagram MD;  Location: [facility];  Service: Vascular;  Laterality: Right;  . SOFT TISSUE MASS EXCISION     Right arm, Left leg  for MRSA infection    Current Medications: Current Meds  Medication Sig  . aspirin EC 81 MG tablet Take 1 tablet (81 mg total) by mouth daily.  Marland Kitchen atorvastatin (LIPITOR) 40 MG tablet Take 1 tablet (40 mg total) by mouth daily.  . blood glucose meter kit and supplies Dispense based on patient and insurance preference. Use up to four times daily as directed. (FOR ICD-10 E10.9, E11.9).  . blood glucose meter kit and supplies Dispense based on patient and insurance preference. Use up to four times daily as directed. (FOR ICD-9 250.00, 250.01).  Marland Kitchen clopidogrel (PLAVIX) 75 MG tablet Take 75 mg by mouth daily.  . Continuous Blood Gluc Sensor (FREESTYLE LIBRE 14 DAY SENSOR) MISC See admin instructions.  Marland Kitchen doxycycline (VIBRAMYCIN) 100 MG capsule Take 100 mg by mouth 2 (two) times daily.  . ferric citrate (AURYXIA) 1 GM 210 MG(Fe) tablet Take 210-630 mg by mouth See admin instructions. Take 3 tablets (630 mg) by mouth up to three times daily with meals, take 1 tablet (210 mg) with snacks  . glucose  blood (ACCU-CHEK AVIVA PLUS) test strip Use as instructed  . HYDROcodone-acetaminophen (NORCO) 10-325 MG tablet Take 1 tablet by mouth every 8 (eight) hours as needed (pain).  Marland Kitchen HYDROmorphone (DILAUDID) 2 MG tablet TAKE 1 TABLET BY MOUTH EVERY 4 HOURS AS NEEDED FOR SEVERE PAIN  . Lancet Devices (ACCU-CHEK SOFTCLIX) lancets Use as instructed  . Lanthanum Carbonate (FOSRENOL) 1000 MG PACK Take 1,000 mg by mouth 3 (three) times daily with meals.   Marland Kitchen loratadine (CLARITIN) 10 MG tablet Take 10 mg by mouth daily as needed for allergies.  . Melatonin 5 MG CAPS Take 10 mg by mouth at bedtime as needed (sleep).  . multivitamin (RENA-VIT) TABS tablet Take 1 tablet by mouth See admin instructions. Take one tablet by mouth on Monday, Wednesday, Friday after dialysis  . OVER THE COUNTER MEDICATION Take 1 Bottle by mouth 2 (two) times a day. Vegan Protein Shake  . OVER THE COUNTER MEDICATION Take 1 tablet by mouth daily. ZYMESSEC (cholesterol reducing enzyme)     Allergies:   Amoxicillin, Hydromorphone, and Percocet [oxycodone-acetaminophen]   Social History   Socioeconomic History  . Marital status: Married    Spouse name: Not on file  . Number of children: Not on file  . Years of education: Not on file  . Highest education level: Not on file  Occupational History  . Not on file  Social Needs  . Financial resource strain: Not on file  . Food insecurity    Worry: Not on file    Inability: Not on file  . Transportation needs    Medical: Not on file    Non-medical: Not on file  Tobacco Use  . Smoking status: Never Smoker  . Smokeless tobacco: Never Used  Substance and Sexual Activity  . Alcohol use: No  . Drug use: No  . Sexual activity: Yes  Lifestyle  . Physical activity    Days per week: Not on file    Minutes per session: Not on file  . Stress: Not on file  Relationships  . Social Herbalist on phone: Not on file    Gets together: Not on file    Attends religious  service: Not on file    Active member of club or organization:  Not on file    Attends meetings of clubs or organizations: Not on file    Relationship status: Not on file  Other Topics Concern  . Not on file  Social History Narrative  . Not on file     Family History: The patient's family history includes Diabetes in his mother.  ROS:   Please see the history of present illness.     All other systems reviewed and are negative.  EKGs/Labs/Other Studies Reviewed:    The following studies were reviewed today: Prior angiogram stress test EKG  EKG: 05/14/2019-sinus rhythm 90 with no abnormalities personally reviewed  Recent Labs: 05/16/2019: TSH 1.013 05/25/2019: Magnesium 2.5 05/30/2019: ALT 12 06/28/2019: BUN 28; Creatinine, Ser 8.91; Hemoglobin 8.6; Platelets 308; Potassium 4.6; Sodium 141  Recent Lipid Panel    Component Value Date/Time   CHOL 231 (H) 05/15/2019 0931   TRIG 236 (H) 05/15/2019 0931   HDL 43 05/15/2019 0931   CHOLHDL 5.4 05/15/2019 0931   VLDL 47 (H) 05/15/2019 0931   LDLCALC 141 (H) 05/15/2019 0931   LDLDIRECT 166.1 11/13/2008 1202    Physical Exam:    VS:  BP (!) 148/80   Pulse 89   Ht '5\' 10"'  (1.778 m)   Wt 207 lb (93.9 kg)   SpO2 99%   BMI 29.70 kg/m     Wt Readings from Last 3 Encounters:  07/20/19 207 lb (93.9 kg)  06/28/19 213 lb 8 oz (96.8 kg)  05/30/19 213 lb 6.5 oz (96.8 kg)     GEN:  Well nourished, well developed in no acute distress HEENT: Normal NECK: No JVD; No carotid bruits LYMPHATICS: No lymphadenopathy CARDIAC: RRR, you can hear the amplification of the right arm of her fistula in the upper regions of his venous return of his heart.  No valvular murmurs, rubs, gallops RESPIRATORY:  Clear to auscultation without rales, wheezing or rhonchi  ABDOMEN: Soft, non-tender, non-distended MUSCULOSKELETAL: Lower extremity toe amputations noted, boot in place SKIN: Warm and dry NEUROLOGIC:  Alert and oriented x 3 PSYCHIATRIC:  Normal  affect   ASSESSMENT:    1. Atypical chest pain   2. Chest pain, unspecified type   3. PAD (peripheral artery disease) ([name])   4. Hyperlipidemia, unspecified hyperlipidemia type   5. Diabetes mellitus with coincident hypertension ([place])   6. End stage renal disease ([place])    PLAN:    In order of problems listed above:  Atypical chest pain -We will go ahead and perform a pharmacologic nuclear stress test.  He is unable to walk the treadmill.  Prior amputation of toes.  Risk factors include severe peripheral vascular disease, diabetes with hypertension, end-stage renal disease.  Prior stress test in 2014 was reassuring with no ischemia.  Peripheral vascular disease -Continue with aggressive secondary risk factor prevention, aspirin, statin.  Currently receiving [facility] with vascular at [facility].  End-stage renal disease - Followed by nephrology team, right upper arm fistula noted  Diabetes with hypertension -Under good control, primary team.  We will follow-up with results of study.   Medication Adjustments/Labs and Tests Ordered: Current medicines are reviewed at length with the patient today.  Concerns regarding medicines are outlined above.  Orders Placed This Encounter  Procedures  . MYOCARDIAL PERFUSION IMAGING   No orders of the defined types were placed in this encounter.   Patient Instructions  Medication Instructions:  No changes If you need a refill on your cardiac medications before your next appointment, please call your pharmacy.  Lab work: none If you have labs (blood work) drawn today and your tests are completely normal, you will receive your results only by: Marland Kitchen MyChart Message (if you have MyChart) OR . A paper copy in the mail If you have any lab test that is abnormal or we need to change your treatment, we will call you to review the results.  Testing/Procedures: Your physician has requested that you have a lexiscan myoview. For further information please  visit [email or web address]. Please follow instruction sheet, as given.   Follow-Up: Follow up with your physician will depend on test results.  Any Other Special Instructions Will Be Listed Below (If Applicable).       Signed, Candee Furbish, MD  07/20/2019 2:40 PM    [facility]

## 2019-07-20 NOTE — Patient Instructions (Signed)
Medication Instructions:  No changes If you need a refill on your cardiac medications before your next appointment, please call your pharmacy.   Lab work: none If you have labs (blood work) drawn today and your tests are completely normal, you will receive your results only by: Marland Kitchen MyChart Message (if you have MyChart) OR . A paper copy in the mail If you have any lab test that is abnormal or we need to change your treatment, we will call you to review the results.  Testing/Procedures: Your physician has requested that you have a lexiscan myoview. For further information please visit [email or web address]. Please follow instruction sheet, as given.   Follow-Up: Follow up with your physician will depend on test results.  Any Other Special Instructions Will Be Listed Below (If Applicable).

## 2019-07-21 DIAGNOSIS — Z992 Dependence on renal dialysis: Secondary | ICD-10-CM | POA: Diagnosis not present

## 2019-07-21 DIAGNOSIS — N186 End stage renal disease: Secondary | ICD-10-CM | POA: Diagnosis not present

## 2019-07-21 DIAGNOSIS — E1129 Type 2 diabetes mellitus with other diabetic kidney complication: Secondary | ICD-10-CM | POA: Diagnosis not present

## 2019-07-22 DIAGNOSIS — N186 End stage renal disease: Secondary | ICD-10-CM | POA: Diagnosis not present

## 2019-07-22 DIAGNOSIS — Z992 Dependence on renal dialysis: Secondary | ICD-10-CM | POA: Diagnosis not present

## 2019-07-22 DIAGNOSIS — N2581 Secondary hyperparathyroidism of renal origin: Secondary | ICD-10-CM | POA: Diagnosis not present

## 2019-07-22 DIAGNOSIS — D509 Iron deficiency anemia, unspecified: Secondary | ICD-10-CM | POA: Diagnosis not present

## 2019-07-22 DIAGNOSIS — E1122 Type 2 diabetes mellitus with diabetic chronic kidney disease: Secondary | ICD-10-CM | POA: Diagnosis not present

## 2019-07-22 DIAGNOSIS — D631 Anemia in chronic kidney disease: Secondary | ICD-10-CM | POA: Diagnosis not present

## 2019-07-22 DIAGNOSIS — I120 Hypertensive chronic kidney disease with stage 5 chronic kidney disease or end stage renal disease: Secondary | ICD-10-CM | POA: Diagnosis not present

## 2019-07-22 DIAGNOSIS — Z4781 Encounter for orthopedic aftercare following surgical amputation: Secondary | ICD-10-CM | POA: Diagnosis not present

## 2019-07-22 DIAGNOSIS — E1151 Type 2 diabetes mellitus with diabetic peripheral angiopathy without gangrene: Secondary | ICD-10-CM | POA: Diagnosis not present

## 2019-07-25 ENCOUNTER — Telehealth (HOSPITAL_COMMUNITY): Payer: Self-pay

## 2019-07-25 DIAGNOSIS — I120 Hypertensive chronic kidney disease with stage 5 chronic kidney disease or end stage renal disease: Secondary | ICD-10-CM | POA: Diagnosis not present

## 2019-07-25 DIAGNOSIS — N186 End stage renal disease: Secondary | ICD-10-CM | POA: Diagnosis not present

## 2019-07-25 DIAGNOSIS — E1122 Type 2 diabetes mellitus with diabetic chronic kidney disease: Secondary | ICD-10-CM | POA: Diagnosis not present

## 2019-07-25 DIAGNOSIS — N2581 Secondary hyperparathyroidism of renal origin: Secondary | ICD-10-CM | POA: Diagnosis not present

## 2019-07-25 DIAGNOSIS — Z4781 Encounter for orthopedic aftercare following surgical amputation: Secondary | ICD-10-CM | POA: Diagnosis not present

## 2019-07-25 DIAGNOSIS — E1151 Type 2 diabetes mellitus with diabetic peripheral angiopathy without gangrene: Secondary | ICD-10-CM | POA: Diagnosis not present

## 2019-07-25 DIAGNOSIS — D509 Iron deficiency anemia, unspecified: Secondary | ICD-10-CM | POA: Diagnosis not present

## 2019-07-25 DIAGNOSIS — Z992 Dependence on renal dialysis: Secondary | ICD-10-CM | POA: Diagnosis not present

## 2019-07-25 DIAGNOSIS — D631 Anemia in chronic kidney disease: Secondary | ICD-10-CM | POA: Diagnosis not present

## 2019-07-25 NOTE — Telephone Encounter (Signed)
Patient contacted, instructions given. The patient stated that he would be here. Asked to call back with any questions. S.Glendia Olshefski EMTP

## 2019-07-26 ENCOUNTER — Encounter (HOSPITAL_COMMUNITY): Payer: Medicare Other

## 2019-07-27 DIAGNOSIS — Z992 Dependence on renal dialysis: Secondary | ICD-10-CM | POA: Diagnosis not present

## 2019-07-27 DIAGNOSIS — N186 End stage renal disease: Secondary | ICD-10-CM | POA: Diagnosis not present

## 2019-07-27 DIAGNOSIS — N2581 Secondary hyperparathyroidism of renal origin: Secondary | ICD-10-CM | POA: Diagnosis not present

## 2019-07-27 DIAGNOSIS — E1129 Type 2 diabetes mellitus with other diabetic kidney complication: Secondary | ICD-10-CM | POA: Diagnosis not present

## 2019-07-27 DIAGNOSIS — D509 Iron deficiency anemia, unspecified: Secondary | ICD-10-CM | POA: Diagnosis not present

## 2019-07-27 DIAGNOSIS — E1151 Type 2 diabetes mellitus with diabetic peripheral angiopathy without gangrene: Secondary | ICD-10-CM | POA: Diagnosis not present

## 2019-07-27 DIAGNOSIS — Z4781 Encounter for orthopedic aftercare following surgical amputation: Secondary | ICD-10-CM | POA: Diagnosis not present

## 2019-07-27 DIAGNOSIS — D631 Anemia in chronic kidney disease: Secondary | ICD-10-CM | POA: Diagnosis not present

## 2019-07-27 DIAGNOSIS — I120 Hypertensive chronic kidney disease with stage 5 chronic kidney disease or end stage renal disease: Secondary | ICD-10-CM | POA: Diagnosis not present

## 2019-07-27 DIAGNOSIS — E1122 Type 2 diabetes mellitus with diabetic chronic kidney disease: Secondary | ICD-10-CM | POA: Diagnosis not present

## 2019-07-29 DIAGNOSIS — Z992 Dependence on renal dialysis: Secondary | ICD-10-CM | POA: Diagnosis not present

## 2019-07-29 DIAGNOSIS — N186 End stage renal disease: Secondary | ICD-10-CM | POA: Diagnosis not present

## 2019-07-29 DIAGNOSIS — N2581 Secondary hyperparathyroidism of renal origin: Secondary | ICD-10-CM | POA: Diagnosis not present

## 2019-07-29 DIAGNOSIS — D509 Iron deficiency anemia, unspecified: Secondary | ICD-10-CM | POA: Diagnosis not present

## 2019-07-29 DIAGNOSIS — E1122 Type 2 diabetes mellitus with diabetic chronic kidney disease: Secondary | ICD-10-CM | POA: Diagnosis not present

## 2019-07-29 DIAGNOSIS — D631 Anemia in chronic kidney disease: Secondary | ICD-10-CM | POA: Diagnosis not present

## 2019-07-29 DIAGNOSIS — E1151 Type 2 diabetes mellitus with diabetic peripheral angiopathy without gangrene: Secondary | ICD-10-CM | POA: Diagnosis not present

## 2019-07-29 DIAGNOSIS — I120 Hypertensive chronic kidney disease with stage 5 chronic kidney disease or end stage renal disease: Secondary | ICD-10-CM | POA: Diagnosis not present

## 2019-07-29 DIAGNOSIS — Z4781 Encounter for orthopedic aftercare following surgical amputation: Secondary | ICD-10-CM | POA: Diagnosis not present

## 2019-08-01 DIAGNOSIS — E1122 Type 2 diabetes mellitus with diabetic chronic kidney disease: Secondary | ICD-10-CM | POA: Diagnosis not present

## 2019-08-01 DIAGNOSIS — Z992 Dependence on renal dialysis: Secondary | ICD-10-CM | POA: Diagnosis not present

## 2019-08-01 DIAGNOSIS — E1151 Type 2 diabetes mellitus with diabetic peripheral angiopathy without gangrene: Secondary | ICD-10-CM | POA: Diagnosis not present

## 2019-08-01 DIAGNOSIS — N186 End stage renal disease: Secondary | ICD-10-CM | POA: Diagnosis not present

## 2019-08-01 DIAGNOSIS — D631 Anemia in chronic kidney disease: Secondary | ICD-10-CM | POA: Diagnosis not present

## 2019-08-01 DIAGNOSIS — N2581 Secondary hyperparathyroidism of renal origin: Secondary | ICD-10-CM | POA: Diagnosis not present

## 2019-08-01 DIAGNOSIS — I120 Hypertensive chronic kidney disease with stage 5 chronic kidney disease or end stage renal disease: Secondary | ICD-10-CM | POA: Diagnosis not present

## 2019-08-01 DIAGNOSIS — Z4781 Encounter for orthopedic aftercare following surgical amputation: Secondary | ICD-10-CM | POA: Diagnosis not present

## 2019-08-01 DIAGNOSIS — D509 Iron deficiency anemia, unspecified: Secondary | ICD-10-CM | POA: Diagnosis not present

## 2019-08-03 DIAGNOSIS — Z4781 Encounter for orthopedic aftercare following surgical amputation: Secondary | ICD-10-CM | POA: Diagnosis not present

## 2019-08-03 DIAGNOSIS — I120 Hypertensive chronic kidney disease with stage 5 chronic kidney disease or end stage renal disease: Secondary | ICD-10-CM | POA: Diagnosis not present

## 2019-08-03 DIAGNOSIS — E1122 Type 2 diabetes mellitus with diabetic chronic kidney disease: Secondary | ICD-10-CM | POA: Diagnosis not present

## 2019-08-03 DIAGNOSIS — D631 Anemia in chronic kidney disease: Secondary | ICD-10-CM | POA: Diagnosis not present

## 2019-08-03 DIAGNOSIS — D509 Iron deficiency anemia, unspecified: Secondary | ICD-10-CM | POA: Diagnosis not present

## 2019-08-03 DIAGNOSIS — N186 End stage renal disease: Secondary | ICD-10-CM | POA: Diagnosis not present

## 2019-08-03 DIAGNOSIS — N2581 Secondary hyperparathyroidism of renal origin: Secondary | ICD-10-CM | POA: Diagnosis not present

## 2019-08-03 DIAGNOSIS — Z992 Dependence on renal dialysis: Secondary | ICD-10-CM | POA: Diagnosis not present

## 2019-08-03 DIAGNOSIS — E1151 Type 2 diabetes mellitus with diabetic peripheral angiopathy without gangrene: Secondary | ICD-10-CM | POA: Diagnosis not present

## 2019-08-05 ENCOUNTER — Emergency Department (HOSPITAL_COMMUNITY)
Admission: EM | Admit: 2019-08-05 | Discharge: 2019-08-05 | Disposition: A | Discharge status: Home / Self Care | Payer: Medicare Other | Attending: Emergency Medicine | Admitting: Emergency Medicine

## 2019-08-05 ENCOUNTER — Emergency Department (HOSPITAL_COMMUNITY): Payer: Medicare Other

## 2019-08-05 ENCOUNTER — Other Ambulatory Visit: Payer: Self-pay

## 2019-08-05 DIAGNOSIS — Z7902 Long term (current) use of antithrombotics/antiplatelets: Secondary | ICD-10-CM | POA: Insufficient documentation

## 2019-08-05 DIAGNOSIS — D509 Iron deficiency anemia, unspecified: Secondary | ICD-10-CM | POA: Diagnosis not present

## 2019-08-05 DIAGNOSIS — E1122 Type 2 diabetes mellitus with diabetic chronic kidney disease: Secondary | ICD-10-CM | POA: Diagnosis not present

## 2019-08-05 DIAGNOSIS — E11319 Type 2 diabetes mellitus with unspecified diabetic retinopathy without macular edema: Secondary | ICD-10-CM | POA: Diagnosis not present

## 2019-08-05 DIAGNOSIS — I120 Hypertensive chronic kidney disease with stage 5 chronic kidney disease or end stage renal disease: Secondary | ICD-10-CM | POA: Diagnosis not present

## 2019-08-05 DIAGNOSIS — R079 Chest pain, unspecified: Secondary | ICD-10-CM | POA: Insufficient documentation

## 2019-08-05 DIAGNOSIS — Z7982 Long term (current) use of aspirin: Secondary | ICD-10-CM | POA: Insufficient documentation

## 2019-08-05 DIAGNOSIS — N186 End stage renal disease: Secondary | ICD-10-CM | POA: Diagnosis not present

## 2019-08-05 DIAGNOSIS — Z992 Dependence on renal dialysis: Secondary | ICD-10-CM | POA: Diagnosis not present

## 2019-08-05 DIAGNOSIS — Z79899 Other long term (current) drug therapy: Secondary | ICD-10-CM | POA: Insufficient documentation

## 2019-08-05 DIAGNOSIS — R0789 Other chest pain: Secondary | ICD-10-CM | POA: Diagnosis not present

## 2019-08-05 DIAGNOSIS — D631 Anemia in chronic kidney disease: Secondary | ICD-10-CM | POA: Diagnosis not present

## 2019-08-05 DIAGNOSIS — N2581 Secondary hyperparathyroidism of renal origin: Secondary | ICD-10-CM | POA: Diagnosis not present

## 2019-08-05 LAB — BASIC METABOLIC PANEL
Anion gap: 11 (ref 5–15)
BUN: 37 mg/dL — ABNORMAL HIGH (ref 6–20)
CO2: 31 mmol/L (ref 22–32)
Calcium: 9.2 mg/dL (ref 8.9–10.3)
Chloride: 96 mmol/L — ABNORMAL LOW (ref 98–111)
Creatinine, Ser: 9.64 mg/dL — ABNORMAL HIGH (ref 0.61–1.24)
GFR calc Af Amer: 6 mL/min — ABNORMAL LOW (ref >60)
GFR calc non Af Amer: 6 mL/min — ABNORMAL LOW (ref >60)
Glucose, Bld: 142 mg/dL — ABNORMAL HIGH (ref 70–99)
Potassium: 5.2 mmol/L — ABNORMAL HIGH (ref 3.5–5.1)
Sodium: 138 mmol/L (ref 135–145)

## 2019-08-05 LAB — CBC
HCT: 31.7 % — ABNORMAL LOW (ref 39.0–52.0)
Hemoglobin: 9.1 g/dL — ABNORMAL LOW (ref 13.0–17.0)
MCH: 25.1 pg — ABNORMAL LOW (ref 26.0–34.0)
MCHC: 28.7 g/dL — ABNORMAL LOW (ref 30.0–36.0)
MCV: 87.3 fL (ref 80.0–100.0)
Platelets: 338 10*3/uL (ref 150–400)
RBC: 3.63 MIL/uL — ABNORMAL LOW (ref 4.22–5.81)
RDW: 18.9 % — ABNORMAL HIGH (ref 11.5–15.5)
WBC: 6 10*3/uL (ref 4.0–10.5)
nRBC: 0 % (ref 0.0–0.2)

## 2019-08-05 LAB — TROPONIN I (HIGH SENSITIVITY)
Troponin I (High Sensitivity): 18 ng/L — ABNORMAL HIGH (ref <18)
Troponin I (High Sensitivity): 15 ng/L (ref <18)

## 2019-08-05 MED ORDER — SODIUM CHLORIDE 0.9% FLUSH: 3.0000 mL | Freq: Once | INTRAVENOUS | Status: DC

## 2019-08-05 NOTE — ED Notes (Signed)
Cheick Vert (pts wife) wants to be called when patient gets a room [phone].

## 2019-08-05 NOTE — Discharge Instructions (Signed)
Return here as needed.  Follow-up with

## 2019-08-05 NOTE — ED Provider Notes (Signed)
[facility] EMERGENCY DEPARTMENT Provider Note   CSN: [id number] Arrival date & time: 08/05/19  0507     History   Chief Complaint Chief Complaint  Patient presents with  . Chest Pain    HPI [name] is [name] 53 y.o. male.     HPI Patient presents to the emergency department with chest pain that started earlier this morning while eating breakfast.  The patient states the pain lasted for about 15 minutes.  He states he felt like his heart was pounding.  The patient states that he did not have any other symptoms.  Patient states that he wanted to come get checked out because he was going to dialysis but decided to come here instead.  Patient did not take any medications prior to arrival for his symptoms.  The patient denies  shortness of breath, headache,blurred vision, neck pain, fever, cough, weakness, numbness, dizziness, anorexia, edema, abdominal pain, nausea, vomiting, diarrhea, rash, back pain, dysuria, hematemesis, bloody stool, near syncope, or syncope. Past Medical History:  Diagnosis Date  . Abscess of right foot   . Acute osteomyelitis of right foot ([place]) 05/15/2019  . Anemia 06/23/2019  . Cellulitis of right foot 05/22/2019  . Chest pain 08/23/2013  . Chills    at night - sometimes  . Chronic kidney disease, stage V ([place]) 08/23/2013  . Complication of anesthesia   . Diabetes mellitus    controlled by diet  . 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 Diabetic foot ulcer- right second toe 05/14/2019  . Diabetic infection of right foot ([place])   . Diabetic retinopathy associated with type 2 diabetes mellitus ([place]) 06/23/2019  . Eczema   . ESRD on dialysis ([place]) 07/18/2011  . Essential hypertension 10/18/2007   Qualifier: Diagnosis of  By: [name] Drilling MD, [name]   . Fatigue    loss of fatigue  . FOOT ULCER, LEFT 06/29/2008   Qualifier: Diagnosis of  By: [name] Drilling MD, [name]   . Gangrene of toe of right foot ([place])   . Headache(784.0)    Years ago   . Heart murmur    years ago  . Hemodialysis status ([place]) 08/23/2013  . History of blood transfusion   . Hyperglycemia 05/05/2015  . HYPERLIPIDEMIA 05/16/2008   Qualifier: Diagnosis of  By: [name] Reichmann MD, Hunt Oris   . Hypertension    sees Dr. Jaci Standard  . Hypoglycemia, unspecified 11/13/2008   Qualifier: Diagnosis of  By: [name] Reichmann MD, Hunt Oris   . MRSA (methicillin resistant staph aureus) culture positive   . MRSA INFECTION 10/18/2007   Qualifier: Diagnosis of  By: [name] RMA, Lucy    . Obesity, unspecified 08/23/2013  . Osteomyelitis of great toe of right foot ([place]) 05/14/2019  . Other forms of retinal detachment(361.89) 06/23/2019  . PONV (postoperative nausea and vomiting)   . Poor circulation   . Renal disorder   . RENAL INSUFFICIENCY 07/31/2008   Qualifier: Diagnosis of  By: [name] Reichmann MD, Hunt Oris   . Secondary hyperparathyroidism ([place]) 06/23/2019  . Sepsis ([place]) 05/14/2019  . Type 2 diabetes mellitus ([place]) 08/23/2013  . Ulcer of right foot, with necrosis of bone ([place])   . Ulcer of right second toe with necrosis of bone [facility])     Patient Active Problem List   Diagnosis Date Noted  . Anemia 06/23/2019  . Diabetic retinopathy associated with type 2 diabetes mellitus ([place]) 06/23/2019  . Other forms of retinal detachment(361.89) 06/23/2019  . Secondary hyperparathyroidism ([name]) 06/23/2019  .  Gangrene of toe of right foot ([name])   . Ulcer of right foot, with necrosis of bone ([place])   . Abscess of right foot   . Diabetic infection of right foot ([place])   . Ulcer of right second toe with necrosis of bone ([place])   . Cellulitis of right foot 05/22/2019  . Acute osteomyelitis of right foot ([place]) 05/15/2019  . Osteomyelitis of great toe of right foot ([place]) 05/14/2019  . Diabetic foot ulcer- right second toe 05/14/2019  . Sepsis ([place]) 05/14/2019  . Hyperlipidemia 05/11/2015  . Hyperglycemia 05/05/2015  . Chest pain 08/23/2013  . Type 2 diabetes mellitus ([name]) 08/23/2013  . Hemodialysis status ([place]) 08/23/2013   . Chronic kidney disease, stage V ([place]) 08/23/2013  . Obesity, unspecified 08/23/2013  . ESRD on dialysis ([name]) 07/18/2011  . HYPOGLYCEMIA, UNSPECIFIED 11/13/2008  . RENAL INSUFFICIENCY 07/31/2008  . FOOT ULCER, LEFT 06/29/2008  . HYPERLIPIDEMIA 05/16/2008  . MRSA INFECTION 10/18/2007  . Essential hypertension 10/18/2007    Past Surgical History:  Procedure Laterality Date  . ABDOMINAL AORTOGRAM N/[name] 05/17/2019   Procedure: ABDOMINAL AORTOGRAM;  Surgeon: Elam Dutch, MD;  Location: [facility];  Service: Cardiovascular;  Laterality: N/[name];  . AMPUTATION TOE Right 05/14/2019   Procedure: PARTIAL AMPUTATION SECOND TOE RIGHT FOOT;  Surgeon: Evelina Bucy, DPM;  Location: [facility];  Service: Podiatry;  Laterality: Right;  . AMPUTATION TOE Right 05/23/2019   Procedure: AMPUTATION OF SECOND TOE METATARSAL PHALANGEAL JOINT;  Surgeon: Evelina Bucy, DPM;  Location: [facility];  Service: Podiatry;  Laterality: Right;  . AMPUTATION TOE Right 05/27/2019   Procedure: Amputation Toe;  Surgeon: Evelina Bucy, DPM;  Location: [facility];  Service: Podiatry;  Laterality: Right;  right third toe  . APPLICATION OF WOUND VAC Right 05/27/2019   Procedure: Application Of Wound Vac;  Surgeon: Evelina Bucy, DPM;  Location: [facility];  Service: Podiatry;  Laterality: Right;  . AV FISTULA PLACEMENT  06-18-11   Right brachiocephalic AVF  . BONE BIOPSY Right 05/14/2019   Procedure: OPEN SUPERFICIAL BONE BIOPSY GREAT TOE;  Surgeon: Evelina Bucy, DPM;  Location: [facility];  Service: Podiatry;  Laterality: Right;  . EYE SURGERY     left eye for Laser, diabetic retinopathy  . FISTULOGRAM Right 11/06/2011   Procedure: FISTULOGRAM;  Surgeon: Conrad [place], MD;  Location: [facility] CATH LAB;  Service: Cardiovascular;  Laterality: Right;  . HEMATOMA EVACUATION Right Feb. 25, 2014  . HEMATOMA EVACUATION Right 12/14/2012   Procedure: EVACUATION HEMATOMA;  Surgeon: Conrad [place], MD;  Location: [facility];  Service: Vascular;  Laterality:  Right;  . INCISION AND DRAINAGE Right 05/23/2019   Procedure: INCISION AND DRAINAGE OF RIGHT FOOT DEEP SPACE ABSCESS;  Surgeon: Evelina Bucy, DPM;  Location: [facility];  Service: Podiatry;  Laterality: Right;  . INSERTION OF DIALYSIS CATHETER  12/14/2012   Procedure: INSERTION OF DIALYSIS CATHETER;  Surgeon: Conrad [place], MD;  Location: [facility];  Service: Vascular;;  . IRRIGATION AND DEBRIDEMENT FOOT Right 05/25/2019   Procedure: INCISION AND DRAINAGE FOOT;  Surgeon: Evelina Bucy, DPM;  Location: [facility];  Service: Podiatry;  Laterality: Right;  . IRRIGATION AND DEBRIDEMENT FOOT Right 05/27/2019   Procedure: IRRIGATION AND DEBRIDEMENT FOOT;  Surgeon: Evelina Bucy, DPM;  Location: [facility];  Service: Podiatry;  Laterality: Right;  . LOWER EXTREMITY ANGIOGRAPHY Bilateral 05/17/2019   Procedure: LOWER EXTREMITY ANGIOGRAPHY;  Surgeon: Elam Dutch, MD;  Location: [name]  CV LAB;  Service: Cardiovascular;  Laterality: Bilateral;  . REVISON OF ARTERIOVENOUS FISTULA Right 12/14/2012   Procedure: REVISON OF upper arm ARTERIOVENOUS FISTULA using 29mx10cm gortex graft;  Surgeon: BConrad Cameron MD;  [place] [facility]  Service: Vascular;  Laterality: Right;  . SOFT TISSUE MASS EXCISION     Right arm, Left leg  for MRSA infection        Home Medications    Prior to Admission medications   Medication Sig Start Date End Date Taking? Authorizing Provider  aspirin EC 81 MG tablet Take 1 tablet (81 mg total) by mouth daily. 06/28/19   AWellington Hampshire MD  atorvastatin (LIPITOR) 40 MG tablet Take 1 tablet (40 mg total) by mouth daily. 06/28/19 09/26/19  AWellington Hampshire MD  blood glucose meter kit and supplies Dispense based on patient and insurance preference. Use up to four times daily as directed. (FOR ICD-10 E10.9, E11.9). 05/18/19   Mikhail, MVelta Addison DO  blood glucose meter kit and supplies Dispense based on patient and insurance preference. Use up to four times daily as directed. (FOR ICD-9 250.00,  250.01). 05/18/19   MCristal Ford DO  clopidogrel (PLAVIX) 75 MG tablet Take 75 mg by mouth daily. 07/12/19   [provider]  Continuous Blood Gluc Sensor (FREESTYLE LIBRE 14 DAY SENSOR) MISC See admin instructions. 06/20/19   [provider]  doxycycline (VIBRAMYCIN) 100 MG capsule Take 100 mg by mouth 2 (two) times daily. 07/12/19   [provider]  ferric citrate (AURYXIA) 1 GM 210 MG(Fe) tablet Take 210-630 mg by mouth See admin instructions. Take 3 tablets (630 mg) by mouth up to three times daily with meals, take 1 tablet (210 mg) with snacks    [provider]  glucose blood (ACCU-CHEK AVIVA PLUS) test strip Use as instructed 05/18/19   MCristal Ford DO  HYDROcodone-acetaminophen (NORCO) 10-325 MG tablet Take 1 tablet by mouth every 8 (eight) hours as needed (pain). 06/16/19   PEvelina Bucy DPM  HYDROmorphone (DILAUDID) 2 MG tablet TAKE 1 TABLET BY MOUTH EVERY 4 HOURS AS NEEDED FOR SEVERE PAIN 05/31/19   [provider]  Lancet Devices ([facility] lancets Use as instructed 05/18/19   MCristal Ford DO  Lanthanum Carbonate (FOSRENOL) 1000 MG PACK Take 1,000 mg by mouth 3 (three) times daily with meals.     [provider]  loratadine (CLARITIN) 10 MG tablet Take 10 mg by mouth daily as needed for allergies.    [provider]  Melatonin 5 MG CAPS Take 10 mg by mouth at bedtime as needed (sleep).    [provider]  multivitamin (RENA-VIT) TABS tablet Take 1 tablet by mouth See admin instructions. Take one tablet by mouth on Monday, Wednesday, Friday after dialysis    [provider]  OVER THE COUNTER MEDICATION Take 1 Bottle by mouth 2 (two) times [name] day. Vegan Protein Shake    [provider]  OVER THE COUNTER MEDICATION Take 1 tablet by mouth daily. ZYMESSEC (cholesterol reducing enzyme)    [provider]    Family History Family History  Problem Relation Age of Onset  .  Diabetes Mother     Social History Social History   Tobacco Use  . Smoking status: Never Smoker  . Smokeless tobacco: Never Used  Substance Use Topics  . Alcohol use: No  . Drug use: No     Allergies   Amoxicillin, Hydromorphone, and Percocet [oxycodone-acetaminophen]   Review of Systems  Review of Systems All other systems negative except as documented in the HPI. All pertinent positives and negatives as reviewed in the HPI.  Physical Exam Updated Vital Signs BP (!) 178/103   Pulse 88   Temp 98.5 F (36.9 C) (Oral)   Resp 13   SpO2 97%   Physical Exam Vitals signs and nursing note reviewed.  Constitutional:      General: He is not in acute distress.    Appearance: He is well-developed.  HENT:     Head: Normocephalic and atraumatic.  Eyes:     Pupils: Pupils are equal, round, and reactive to light.  Neck:     Musculoskeletal: Normal range of motion and neck supple.  Cardiovascular:     Rate and Rhythm: Normal rate and regular rhythm.     Heart sounds: Normal heart sounds. No murmur. No friction rub. No gallop.   Pulmonary:     Effort: Pulmonary effort is normal. No respiratory distress.     Breath sounds: Normal breath sounds. No wheezing.  Abdominal:     General: Bowel sounds are normal. There is no distension.     Palpations: Abdomen is soft.     Tenderness: There is no abdominal tenderness.  Skin:    General: Skin is warm and dry.     Capillary Refill: Capillary refill takes less than 2 seconds.     Findings: No erythema or rash.  Neurological:     Mental Status: He is alert and oriented to person, place, and time.     Motor: No abnormal muscle tone.     Coordination: Coordination normal.  Psychiatric:        Behavior: Behavior normal.      ED Treatments / Results  Labs (all labs ordered are listed, but only abnormal results are displayed) Labs Reviewed  BASIC METABOLIC PANEL - Abnormal; Notable for the following components:      Result Value    Potassium 5.2 (*)    Chloride 96 (*)    Glucose, Bld 142 (*)    BUN 37 (*)    Creatinine, Ser 9.64 (*)    GFR calc non Af Amer 6 (*)    GFR calc Af Amer 6 (*)    All other components within normal limits  CBC - Abnormal; Notable for the following components:   RBC 3.63 (*)    Hemoglobin 9.1 (*)    HCT 31.7 (*)    MCH 25.1 (*)    MCHC 28.7 (*)    RDW 18.9 (*)    All other components within normal limits  TROPONIN I (HIGH SENSITIVITY) - Abnormal; Notable for the following components:   Troponin I (High Sensitivity) 18 (*)    All other components within normal limits  TROPONIN I (HIGH SENSITIVITY)    EKG EKG Interpretation  Date/Time:  Friday August 05 2019 05:29:03 EDT Ventricular Rate:  97 PR Interval:  114 QRS Duration: 84 QT Interval:  342 QTC Calculation: 434 R Axis:   49 Text Interpretation:  Normal sinus rhythm with sinus arrhythmia Normal ECG Confirmed by Madalyn Rob [phone]) on 08/05/2019 8:50:42 AM   Radiology Dg Chest 2 View  Result Date: 08/05/2019 CLINICAL DATA:  Chest pain EXAM: CHEST - 2 VIEW COMPARISON:  05/05/2015 FINDINGS: Low volume chest, stable. Mild interstitial coarsening without Kerley lines, air bronchogram, effusion, or pneumothorax. Venous stenting the right axilla/subclavian. IMPRESSION: No evidence of acute disease. Electronically Signed   By: Monte Fantasia M.D.   On:  08/05/2019 05:52    Procedures Procedures (including critical care time)  Medications Ordered in ED Medications - No data to display   Initial Impression / Assessment and Plan / ED Course  I have reviewed the triage vital signs and the nursing notes.  Pertinent labs & imaging results that were available during my care of the patient were reviewed by me and considered in my medical decision making (see chart for details).        Patient's chest pain was atypical and has not reoccurred.  Patient will be discharged home and have advised him to follow-up with his  primary doctor soon as possible.  The patient agrees the plan and all questions were answered.  Dr. Roslynn Amble saw the patient as well and agrees with this plan.  The patient has 2 sets of negative high-sensitivity troponins.  Final Clinical Impressions(s) / ED Diagnoses   Final diagnoses:  Nonspecific chest pain    ED Discharge Orders    None       Dalia Heading, PA-C 08/05/19 1627    Lucrezia Starch, MD 08/06/19 1205

## 2019-08-05 NOTE — ED Triage Notes (Signed)
Patient reports sudden onset non-radiating, L-sided chest pain about 1 hour ago, lasting approximately 10-15 minutes along with feeling like his heart was beating hard. He denies history of same. States he was eating breakfast at table before his dialysis appointment. Resp e/u, skin w/d. Denies shortness of breath, dizziness, N/V.

## 2019-08-06 DIAGNOSIS — N186 End stage renal disease: Secondary | ICD-10-CM | POA: Diagnosis not present

## 2019-08-06 DIAGNOSIS — I120 Hypertensive chronic kidney disease with stage 5 chronic kidney disease or end stage renal disease: Secondary | ICD-10-CM | POA: Diagnosis not present

## 2019-08-06 DIAGNOSIS — E1122 Type 2 diabetes mellitus with diabetic chronic kidney disease: Secondary | ICD-10-CM | POA: Diagnosis not present

## 2019-08-06 DIAGNOSIS — Z4781 Encounter for orthopedic aftercare following surgical amputation: Secondary | ICD-10-CM | POA: Diagnosis not present

## 2019-08-06 DIAGNOSIS — E1151 Type 2 diabetes mellitus with diabetic peripheral angiopathy without gangrene: Secondary | ICD-10-CM | POA: Diagnosis not present

## 2019-08-06 DIAGNOSIS — Z992 Dependence on renal dialysis: Secondary | ICD-10-CM | POA: Diagnosis not present

## 2019-08-08 DIAGNOSIS — D631 Anemia in chronic kidney disease: Secondary | ICD-10-CM | POA: Diagnosis not present

## 2019-08-08 DIAGNOSIS — Z4781 Encounter for orthopedic aftercare following surgical amputation: Secondary | ICD-10-CM | POA: Diagnosis not present

## 2019-08-08 DIAGNOSIS — E1151 Type 2 diabetes mellitus with diabetic peripheral angiopathy without gangrene: Secondary | ICD-10-CM | POA: Diagnosis not present

## 2019-08-08 DIAGNOSIS — D509 Iron deficiency anemia, unspecified: Secondary | ICD-10-CM | POA: Diagnosis not present

## 2019-08-08 DIAGNOSIS — I120 Hypertensive chronic kidney disease with stage 5 chronic kidney disease or end stage renal disease: Secondary | ICD-10-CM | POA: Diagnosis not present

## 2019-08-08 DIAGNOSIS — N2581 Secondary hyperparathyroidism of renal origin: Secondary | ICD-10-CM | POA: Diagnosis not present

## 2019-08-08 DIAGNOSIS — N186 End stage renal disease: Secondary | ICD-10-CM | POA: Diagnosis not present

## 2019-08-08 DIAGNOSIS — Z992 Dependence on renal dialysis: Secondary | ICD-10-CM | POA: Diagnosis not present

## 2019-08-08 DIAGNOSIS — E1122 Type 2 diabetes mellitus with diabetic chronic kidney disease: Secondary | ICD-10-CM | POA: Diagnosis not present

## 2019-08-10 DIAGNOSIS — D631 Anemia in chronic kidney disease: Secondary | ICD-10-CM | POA: Diagnosis not present

## 2019-08-10 DIAGNOSIS — E1122 Type 2 diabetes mellitus with diabetic chronic kidney disease: Secondary | ICD-10-CM | POA: Diagnosis not present

## 2019-08-10 DIAGNOSIS — N2581 Secondary hyperparathyroidism of renal origin: Secondary | ICD-10-CM | POA: Diagnosis not present

## 2019-08-10 DIAGNOSIS — Z4781 Encounter for orthopedic aftercare following surgical amputation: Secondary | ICD-10-CM | POA: Diagnosis not present

## 2019-08-10 DIAGNOSIS — N186 End stage renal disease: Secondary | ICD-10-CM | POA: Diagnosis not present

## 2019-08-10 DIAGNOSIS — Z992 Dependence on renal dialysis: Secondary | ICD-10-CM | POA: Diagnosis not present

## 2019-08-10 DIAGNOSIS — E1151 Type 2 diabetes mellitus with diabetic peripheral angiopathy without gangrene: Secondary | ICD-10-CM | POA: Diagnosis not present

## 2019-08-10 DIAGNOSIS — I120 Hypertensive chronic kidney disease with stage 5 chronic kidney disease or end stage renal disease: Secondary | ICD-10-CM | POA: Diagnosis not present

## 2019-08-10 DIAGNOSIS — D509 Iron deficiency anemia, unspecified: Secondary | ICD-10-CM | POA: Diagnosis not present

## 2019-08-12 DIAGNOSIS — Z4781 Encounter for orthopedic aftercare following surgical amputation: Secondary | ICD-10-CM | POA: Diagnosis not present

## 2019-08-12 DIAGNOSIS — E1122 Type 2 diabetes mellitus with diabetic chronic kidney disease: Secondary | ICD-10-CM | POA: Diagnosis not present

## 2019-08-12 DIAGNOSIS — N2581 Secondary hyperparathyroidism of renal origin: Secondary | ICD-10-CM | POA: Diagnosis not present

## 2019-08-12 DIAGNOSIS — E1151 Type 2 diabetes mellitus with diabetic peripheral angiopathy without gangrene: Secondary | ICD-10-CM | POA: Diagnosis not present

## 2019-08-12 DIAGNOSIS — D509 Iron deficiency anemia, unspecified: Secondary | ICD-10-CM | POA: Diagnosis not present

## 2019-08-12 DIAGNOSIS — N186 End stage renal disease: Secondary | ICD-10-CM | POA: Diagnosis not present

## 2019-08-12 DIAGNOSIS — I120 Hypertensive chronic kidney disease with stage 5 chronic kidney disease or end stage renal disease: Secondary | ICD-10-CM | POA: Diagnosis not present

## 2019-08-12 DIAGNOSIS — Z992 Dependence on renal dialysis: Secondary | ICD-10-CM | POA: Diagnosis not present

## 2019-08-12 DIAGNOSIS — D631 Anemia in chronic kidney disease: Secondary | ICD-10-CM | POA: Diagnosis not present

## 2019-08-14 DIAGNOSIS — Z7902 Long term (current) use of antithrombotics/antiplatelets: Secondary | ICD-10-CM | POA: Diagnosis not present

## 2019-08-14 DIAGNOSIS — I120 Hypertensive chronic kidney disease with stage 5 chronic kidney disease or end stage renal disease: Secondary | ICD-10-CM | POA: Diagnosis not present

## 2019-08-14 DIAGNOSIS — Z7982 Long term (current) use of aspirin: Secondary | ICD-10-CM | POA: Diagnosis not present

## 2019-08-14 DIAGNOSIS — Z79891 Long term (current) use of opiate analgesic: Secondary | ICD-10-CM | POA: Diagnosis not present

## 2019-08-14 DIAGNOSIS — Z992 Dependence on renal dialysis: Secondary | ICD-10-CM | POA: Diagnosis not present

## 2019-08-14 DIAGNOSIS — N186 End stage renal disease: Secondary | ICD-10-CM | POA: Diagnosis not present

## 2019-08-14 DIAGNOSIS — Z4781 Encounter for orthopedic aftercare following surgical amputation: Secondary | ICD-10-CM | POA: Diagnosis not present

## 2019-08-14 DIAGNOSIS — Z792 Long term (current) use of antibiotics: Secondary | ICD-10-CM | POA: Diagnosis not present

## 2019-08-14 DIAGNOSIS — Z89421 Acquired absence of other right toe(s): Secondary | ICD-10-CM | POA: Diagnosis not present

## 2019-08-14 DIAGNOSIS — Z9181 History of falling: Secondary | ICD-10-CM | POA: Diagnosis not present

## 2019-08-14 DIAGNOSIS — E1122 Type 2 diabetes mellitus with diabetic chronic kidney disease: Secondary | ICD-10-CM | POA: Diagnosis not present

## 2019-08-14 DIAGNOSIS — E1151 Type 2 diabetes mellitus with diabetic peripheral angiopathy without gangrene: Secondary | ICD-10-CM | POA: Diagnosis not present

## 2019-08-15 DIAGNOSIS — Z992 Dependence on renal dialysis: Secondary | ICD-10-CM | POA: Diagnosis not present

## 2019-08-15 DIAGNOSIS — Z4781 Encounter for orthopedic aftercare following surgical amputation: Secondary | ICD-10-CM | POA: Diagnosis not present

## 2019-08-15 DIAGNOSIS — N186 End stage renal disease: Secondary | ICD-10-CM | POA: Diagnosis not present

## 2019-08-15 DIAGNOSIS — D509 Iron deficiency anemia, unspecified: Secondary | ICD-10-CM | POA: Diagnosis not present

## 2019-08-15 DIAGNOSIS — N2581 Secondary hyperparathyroidism of renal origin: Secondary | ICD-10-CM | POA: Diagnosis not present

## 2019-08-15 DIAGNOSIS — E1122 Type 2 diabetes mellitus with diabetic chronic kidney disease: Secondary | ICD-10-CM | POA: Diagnosis not present

## 2019-08-15 DIAGNOSIS — I120 Hypertensive chronic kidney disease with stage 5 chronic kidney disease or end stage renal disease: Secondary | ICD-10-CM | POA: Diagnosis not present

## 2019-08-15 DIAGNOSIS — E1151 Type 2 diabetes mellitus with diabetic peripheral angiopathy without gangrene: Secondary | ICD-10-CM | POA: Diagnosis not present

## 2019-08-15 DIAGNOSIS — D631 Anemia in chronic kidney disease: Secondary | ICD-10-CM | POA: Diagnosis not present

## 2019-08-16 DIAGNOSIS — G8918 Other acute postprocedural pain: Secondary | ICD-10-CM | POA: Diagnosis not present

## 2019-08-16 DIAGNOSIS — L97509 Non-pressure chronic ulcer of other part of unspecified foot with unspecified severity: Secondary | ICD-10-CM | POA: Diagnosis not present

## 2019-08-16 DIAGNOSIS — Z89431 Acquired absence of right foot: Secondary | ICD-10-CM | POA: Diagnosis not present

## 2019-08-16 DIAGNOSIS — M79671 Pain in right foot: Secondary | ICD-10-CM | POA: Diagnosis not present

## 2019-08-16 DIAGNOSIS — I739 Peripheral vascular disease, unspecified: Secondary | ICD-10-CM | POA: Diagnosis not present

## 2019-08-16 DIAGNOSIS — I10 Essential (primary) hypertension: Secondary | ICD-10-CM | POA: Diagnosis not present

## 2019-08-16 DIAGNOSIS — Z4889 Encounter for other specified surgical aftercare: Secondary | ICD-10-CM | POA: Insufficient documentation

## 2019-08-16 DIAGNOSIS — L97512 Non-pressure chronic ulcer of other part of right foot with fat layer exposed: Secondary | ICD-10-CM | POA: Diagnosis not present

## 2019-08-16 DIAGNOSIS — L98499 Non-pressure chronic ulcer of skin of other sites with unspecified severity: Secondary | ICD-10-CM | POA: Diagnosis not present

## 2019-08-16 DIAGNOSIS — E11621 Type 2 diabetes mellitus with foot ulcer: Secondary | ICD-10-CM | POA: Diagnosis not present

## 2019-08-16 DIAGNOSIS — E11622 Type 2 diabetes mellitus with other skin ulcer: Secondary | ICD-10-CM | POA: Diagnosis not present

## 2019-08-17 DIAGNOSIS — N2581 Secondary hyperparathyroidism of renal origin: Secondary | ICD-10-CM | POA: Diagnosis not present

## 2019-08-17 DIAGNOSIS — E1122 Type 2 diabetes mellitus with diabetic chronic kidney disease: Secondary | ICD-10-CM | POA: Diagnosis not present

## 2019-08-17 DIAGNOSIS — D509 Iron deficiency anemia, unspecified: Secondary | ICD-10-CM | POA: Diagnosis not present

## 2019-08-17 DIAGNOSIS — Z4781 Encounter for orthopedic aftercare following surgical amputation: Secondary | ICD-10-CM | POA: Diagnosis not present

## 2019-08-17 DIAGNOSIS — E1151 Type 2 diabetes mellitus with diabetic peripheral angiopathy without gangrene: Secondary | ICD-10-CM | POA: Diagnosis not present

## 2019-08-17 DIAGNOSIS — I120 Hypertensive chronic kidney disease with stage 5 chronic kidney disease or end stage renal disease: Secondary | ICD-10-CM | POA: Diagnosis not present

## 2019-08-17 DIAGNOSIS — Z992 Dependence on renal dialysis: Secondary | ICD-10-CM | POA: Diagnosis not present

## 2019-08-17 DIAGNOSIS — D631 Anemia in chronic kidney disease: Secondary | ICD-10-CM | POA: Diagnosis not present

## 2019-08-17 DIAGNOSIS — N186 End stage renal disease: Secondary | ICD-10-CM | POA: Diagnosis not present

## 2019-08-19 DIAGNOSIS — D631 Anemia in chronic kidney disease: Secondary | ICD-10-CM | POA: Diagnosis not present

## 2019-08-19 DIAGNOSIS — Z992 Dependence on renal dialysis: Secondary | ICD-10-CM | POA: Diagnosis not present

## 2019-08-19 DIAGNOSIS — N186 End stage renal disease: Secondary | ICD-10-CM | POA: Diagnosis not present

## 2019-08-19 DIAGNOSIS — E1151 Type 2 diabetes mellitus with diabetic peripheral angiopathy without gangrene: Secondary | ICD-10-CM | POA: Diagnosis not present

## 2019-08-19 DIAGNOSIS — E1122 Type 2 diabetes mellitus with diabetic chronic kidney disease: Secondary | ICD-10-CM | POA: Diagnosis not present

## 2019-08-19 DIAGNOSIS — N2581 Secondary hyperparathyroidism of renal origin: Secondary | ICD-10-CM | POA: Diagnosis not present

## 2019-08-19 DIAGNOSIS — Z4781 Encounter for orthopedic aftercare following surgical amputation: Secondary | ICD-10-CM | POA: Diagnosis not present

## 2019-08-19 DIAGNOSIS — D509 Iron deficiency anemia, unspecified: Secondary | ICD-10-CM | POA: Diagnosis not present

## 2019-08-19 DIAGNOSIS — I120 Hypertensive chronic kidney disease with stage 5 chronic kidney disease or end stage renal disease: Secondary | ICD-10-CM | POA: Diagnosis not present

## 2019-08-21 DIAGNOSIS — N186 End stage renal disease: Secondary | ICD-10-CM | POA: Diagnosis not present

## 2019-08-21 DIAGNOSIS — Z992 Dependence on renal dialysis: Secondary | ICD-10-CM | POA: Diagnosis not present

## 2019-08-21 DIAGNOSIS — E1129 Type 2 diabetes mellitus with other diabetic kidney complication: Secondary | ICD-10-CM | POA: Diagnosis not present

## 2019-08-22 DIAGNOSIS — E1122 Type 2 diabetes mellitus with diabetic chronic kidney disease: Secondary | ICD-10-CM | POA: Diagnosis not present

## 2019-08-22 DIAGNOSIS — I120 Hypertensive chronic kidney disease with stage 5 chronic kidney disease or end stage renal disease: Secondary | ICD-10-CM | POA: Diagnosis not present

## 2019-08-22 DIAGNOSIS — D509 Iron deficiency anemia, unspecified: Secondary | ICD-10-CM | POA: Diagnosis not present

## 2019-08-22 DIAGNOSIS — E1151 Type 2 diabetes mellitus with diabetic peripheral angiopathy without gangrene: Secondary | ICD-10-CM | POA: Diagnosis not present

## 2019-08-22 DIAGNOSIS — N2581 Secondary hyperparathyroidism of renal origin: Secondary | ICD-10-CM | POA: Diagnosis not present

## 2019-08-22 DIAGNOSIS — Z4781 Encounter for orthopedic aftercare following surgical amputation: Secondary | ICD-10-CM | POA: Diagnosis not present

## 2019-08-22 DIAGNOSIS — D631 Anemia in chronic kidney disease: Secondary | ICD-10-CM | POA: Diagnosis not present

## 2019-08-22 DIAGNOSIS — Z992 Dependence on renal dialysis: Secondary | ICD-10-CM | POA: Diagnosis not present

## 2019-08-22 DIAGNOSIS — N186 End stage renal disease: Secondary | ICD-10-CM | POA: Diagnosis not present

## 2019-08-23 ENCOUNTER — Ambulatory Visit: Payer: Medicare Other | Admitting: Cardiovascular Disease

## 2019-08-24 DIAGNOSIS — E1151 Type 2 diabetes mellitus with diabetic peripheral angiopathy without gangrene: Secondary | ICD-10-CM | POA: Diagnosis not present

## 2019-08-24 DIAGNOSIS — I120 Hypertensive chronic kidney disease with stage 5 chronic kidney disease or end stage renal disease: Secondary | ICD-10-CM | POA: Diagnosis not present

## 2019-08-24 DIAGNOSIS — Z992 Dependence on renal dialysis: Secondary | ICD-10-CM | POA: Diagnosis not present

## 2019-08-24 DIAGNOSIS — N2581 Secondary hyperparathyroidism of renal origin: Secondary | ICD-10-CM | POA: Diagnosis not present

## 2019-08-24 DIAGNOSIS — Z4781 Encounter for orthopedic aftercare following surgical amputation: Secondary | ICD-10-CM | POA: Diagnosis not present

## 2019-08-24 DIAGNOSIS — N186 End stage renal disease: Secondary | ICD-10-CM | POA: Diagnosis not present

## 2019-08-24 DIAGNOSIS — E1122 Type 2 diabetes mellitus with diabetic chronic kidney disease: Secondary | ICD-10-CM | POA: Diagnosis not present

## 2019-08-24 DIAGNOSIS — D631 Anemia in chronic kidney disease: Secondary | ICD-10-CM | POA: Diagnosis not present

## 2019-08-24 DIAGNOSIS — D509 Iron deficiency anemia, unspecified: Secondary | ICD-10-CM | POA: Diagnosis not present

## 2019-08-25 ENCOUNTER — Ambulatory Visit: Payer: Medicare Other | Admitting: Podiatry

## 2019-08-26 DIAGNOSIS — N186 End stage renal disease: Secondary | ICD-10-CM | POA: Diagnosis not present

## 2019-08-26 DIAGNOSIS — E1151 Type 2 diabetes mellitus with diabetic peripheral angiopathy without gangrene: Secondary | ICD-10-CM | POA: Diagnosis not present

## 2019-08-26 DIAGNOSIS — Z992 Dependence on renal dialysis: Secondary | ICD-10-CM | POA: Diagnosis not present

## 2019-08-26 DIAGNOSIS — Z4781 Encounter for orthopedic aftercare following surgical amputation: Secondary | ICD-10-CM | POA: Diagnosis not present

## 2019-08-26 DIAGNOSIS — N2581 Secondary hyperparathyroidism of renal origin: Secondary | ICD-10-CM | POA: Diagnosis not present

## 2019-08-26 DIAGNOSIS — D509 Iron deficiency anemia, unspecified: Secondary | ICD-10-CM | POA: Diagnosis not present

## 2019-08-26 DIAGNOSIS — E1122 Type 2 diabetes mellitus with diabetic chronic kidney disease: Secondary | ICD-10-CM | POA: Diagnosis not present

## 2019-08-26 DIAGNOSIS — D631 Anemia in chronic kidney disease: Secondary | ICD-10-CM | POA: Diagnosis not present

## 2019-08-26 DIAGNOSIS — I120 Hypertensive chronic kidney disease with stage 5 chronic kidney disease or end stage renal disease: Secondary | ICD-10-CM | POA: Diagnosis not present

## 2019-08-29 DIAGNOSIS — I120 Hypertensive chronic kidney disease with stage 5 chronic kidney disease or end stage renal disease: Secondary | ICD-10-CM | POA: Diagnosis not present

## 2019-08-29 DIAGNOSIS — N2581 Secondary hyperparathyroidism of renal origin: Secondary | ICD-10-CM | POA: Diagnosis not present

## 2019-08-29 DIAGNOSIS — E1122 Type 2 diabetes mellitus with diabetic chronic kidney disease: Secondary | ICD-10-CM | POA: Diagnosis not present

## 2019-08-29 DIAGNOSIS — N186 End stage renal disease: Secondary | ICD-10-CM | POA: Diagnosis not present

## 2019-08-29 DIAGNOSIS — D631 Anemia in chronic kidney disease: Secondary | ICD-10-CM | POA: Diagnosis not present

## 2019-08-29 DIAGNOSIS — Z4781 Encounter for orthopedic aftercare following surgical amputation: Secondary | ICD-10-CM | POA: Diagnosis not present

## 2019-08-29 DIAGNOSIS — E1151 Type 2 diabetes mellitus with diabetic peripheral angiopathy without gangrene: Secondary | ICD-10-CM | POA: Diagnosis not present

## 2019-08-29 DIAGNOSIS — Z992 Dependence on renal dialysis: Secondary | ICD-10-CM | POA: Diagnosis not present

## 2019-08-29 DIAGNOSIS — D509 Iron deficiency anemia, unspecified: Secondary | ICD-10-CM | POA: Diagnosis not present

## 2019-08-31 DIAGNOSIS — N186 End stage renal disease: Secondary | ICD-10-CM | POA: Diagnosis not present

## 2019-08-31 DIAGNOSIS — I120 Hypertensive chronic kidney disease with stage 5 chronic kidney disease or end stage renal disease: Secondary | ICD-10-CM | POA: Diagnosis not present

## 2019-08-31 DIAGNOSIS — Z4781 Encounter for orthopedic aftercare following surgical amputation: Secondary | ICD-10-CM | POA: Diagnosis not present

## 2019-08-31 DIAGNOSIS — D631 Anemia in chronic kidney disease: Secondary | ICD-10-CM | POA: Diagnosis not present

## 2019-08-31 DIAGNOSIS — D509 Iron deficiency anemia, unspecified: Secondary | ICD-10-CM | POA: Diagnosis not present

## 2019-08-31 DIAGNOSIS — N2581 Secondary hyperparathyroidism of renal origin: Secondary | ICD-10-CM | POA: Diagnosis not present

## 2019-08-31 DIAGNOSIS — E1151 Type 2 diabetes mellitus with diabetic peripheral angiopathy without gangrene: Secondary | ICD-10-CM | POA: Diagnosis not present

## 2019-08-31 DIAGNOSIS — Z992 Dependence on renal dialysis: Secondary | ICD-10-CM | POA: Diagnosis not present

## 2019-08-31 DIAGNOSIS — E1122 Type 2 diabetes mellitus with diabetic chronic kidney disease: Secondary | ICD-10-CM | POA: Diagnosis not present

## 2019-09-02 DIAGNOSIS — Z992 Dependence on renal dialysis: Secondary | ICD-10-CM | POA: Diagnosis not present

## 2019-09-02 DIAGNOSIS — N2581 Secondary hyperparathyroidism of renal origin: Secondary | ICD-10-CM | POA: Diagnosis not present

## 2019-09-02 DIAGNOSIS — N186 End stage renal disease: Secondary | ICD-10-CM | POA: Diagnosis not present

## 2019-09-02 DIAGNOSIS — D631 Anemia in chronic kidney disease: Secondary | ICD-10-CM | POA: Diagnosis not present

## 2019-09-02 DIAGNOSIS — D509 Iron deficiency anemia, unspecified: Secondary | ICD-10-CM | POA: Diagnosis not present

## 2019-09-03 DIAGNOSIS — E1151 Type 2 diabetes mellitus with diabetic peripheral angiopathy without gangrene: Secondary | ICD-10-CM | POA: Diagnosis not present

## 2019-09-03 DIAGNOSIS — Z4781 Encounter for orthopedic aftercare following surgical amputation: Secondary | ICD-10-CM | POA: Diagnosis not present

## 2019-09-03 DIAGNOSIS — I120 Hypertensive chronic kidney disease with stage 5 chronic kidney disease or end stage renal disease: Secondary | ICD-10-CM | POA: Diagnosis not present

## 2019-09-03 DIAGNOSIS — N186 End stage renal disease: Secondary | ICD-10-CM | POA: Diagnosis not present

## 2019-09-03 DIAGNOSIS — E1122 Type 2 diabetes mellitus with diabetic chronic kidney disease: Secondary | ICD-10-CM | POA: Diagnosis not present

## 2019-09-03 DIAGNOSIS — Z992 Dependence on renal dialysis: Secondary | ICD-10-CM | POA: Diagnosis not present

## 2019-09-05 DIAGNOSIS — E1122 Type 2 diabetes mellitus with diabetic chronic kidney disease: Secondary | ICD-10-CM | POA: Diagnosis not present

## 2019-09-05 DIAGNOSIS — D509 Iron deficiency anemia, unspecified: Secondary | ICD-10-CM | POA: Diagnosis not present

## 2019-09-05 DIAGNOSIS — E1151 Type 2 diabetes mellitus with diabetic peripheral angiopathy without gangrene: Secondary | ICD-10-CM | POA: Diagnosis not present

## 2019-09-05 DIAGNOSIS — Z4781 Encounter for orthopedic aftercare following surgical amputation: Secondary | ICD-10-CM | POA: Diagnosis not present

## 2019-09-05 DIAGNOSIS — N2581 Secondary hyperparathyroidism of renal origin: Secondary | ICD-10-CM | POA: Diagnosis not present

## 2019-09-05 DIAGNOSIS — N186 End stage renal disease: Secondary | ICD-10-CM | POA: Diagnosis not present

## 2019-09-05 DIAGNOSIS — I120 Hypertensive chronic kidney disease with stage 5 chronic kidney disease or end stage renal disease: Secondary | ICD-10-CM | POA: Diagnosis not present

## 2019-09-05 DIAGNOSIS — D631 Anemia in chronic kidney disease: Secondary | ICD-10-CM | POA: Diagnosis not present

## 2019-09-05 DIAGNOSIS — Z992 Dependence on renal dialysis: Secondary | ICD-10-CM | POA: Diagnosis not present

## 2019-09-06 DIAGNOSIS — Z1211 Encounter for screening for malignant neoplasm of colon: Secondary | ICD-10-CM | POA: Diagnosis not present

## 2019-09-06 DIAGNOSIS — Z789 Other specified health status: Secondary | ICD-10-CM | POA: Diagnosis not present

## 2019-09-07 DIAGNOSIS — E1122 Type 2 diabetes mellitus with diabetic chronic kidney disease: Secondary | ICD-10-CM | POA: Diagnosis not present

## 2019-09-07 DIAGNOSIS — I120 Hypertensive chronic kidney disease with stage 5 chronic kidney disease or end stage renal disease: Secondary | ICD-10-CM | POA: Diagnosis not present

## 2019-09-07 DIAGNOSIS — D631 Anemia in chronic kidney disease: Secondary | ICD-10-CM | POA: Diagnosis not present

## 2019-09-07 DIAGNOSIS — N186 End stage renal disease: Secondary | ICD-10-CM | POA: Diagnosis not present

## 2019-09-07 DIAGNOSIS — N2581 Secondary hyperparathyroidism of renal origin: Secondary | ICD-10-CM | POA: Diagnosis not present

## 2019-09-07 DIAGNOSIS — Z4781 Encounter for orthopedic aftercare following surgical amputation: Secondary | ICD-10-CM | POA: Diagnosis not present

## 2019-09-07 DIAGNOSIS — Z992 Dependence on renal dialysis: Secondary | ICD-10-CM | POA: Diagnosis not present

## 2019-09-07 DIAGNOSIS — D509 Iron deficiency anemia, unspecified: Secondary | ICD-10-CM | POA: Diagnosis not present

## 2019-09-07 DIAGNOSIS — E1151 Type 2 diabetes mellitus with diabetic peripheral angiopathy without gangrene: Secondary | ICD-10-CM | POA: Diagnosis not present

## 2019-09-09 DIAGNOSIS — N186 End stage renal disease: Secondary | ICD-10-CM | POA: Diagnosis not present

## 2019-09-09 DIAGNOSIS — D509 Iron deficiency anemia, unspecified: Secondary | ICD-10-CM | POA: Diagnosis not present

## 2019-09-09 DIAGNOSIS — D631 Anemia in chronic kidney disease: Secondary | ICD-10-CM | POA: Diagnosis not present

## 2019-09-09 DIAGNOSIS — N2581 Secondary hyperparathyroidism of renal origin: Secondary | ICD-10-CM | POA: Diagnosis not present

## 2019-09-09 DIAGNOSIS — Z992 Dependence on renal dialysis: Secondary | ICD-10-CM | POA: Diagnosis not present

## 2019-09-11 DIAGNOSIS — D509 Iron deficiency anemia, unspecified: Secondary | ICD-10-CM | POA: Diagnosis not present

## 2019-09-11 DIAGNOSIS — D631 Anemia in chronic kidney disease: Secondary | ICD-10-CM | POA: Diagnosis not present

## 2019-09-11 DIAGNOSIS — Z992 Dependence on renal dialysis: Secondary | ICD-10-CM | POA: Diagnosis not present

## 2019-09-11 DIAGNOSIS — N2581 Secondary hyperparathyroidism of renal origin: Secondary | ICD-10-CM | POA: Diagnosis not present

## 2019-09-11 DIAGNOSIS — N186 End stage renal disease: Secondary | ICD-10-CM | POA: Diagnosis not present

## 2019-09-12 DIAGNOSIS — I120 Hypertensive chronic kidney disease with stage 5 chronic kidney disease or end stage renal disease: Secondary | ICD-10-CM | POA: Diagnosis not present

## 2019-09-12 DIAGNOSIS — E1151 Type 2 diabetes mellitus with diabetic peripheral angiopathy without gangrene: Secondary | ICD-10-CM | POA: Diagnosis not present

## 2019-09-12 DIAGNOSIS — E1122 Type 2 diabetes mellitus with diabetic chronic kidney disease: Secondary | ICD-10-CM | POA: Diagnosis not present

## 2019-09-12 DIAGNOSIS — N186 End stage renal disease: Secondary | ICD-10-CM | POA: Diagnosis not present

## 2019-09-12 DIAGNOSIS — L97512 Non-pressure chronic ulcer of other part of right foot with fat layer exposed: Secondary | ICD-10-CM | POA: Diagnosis not present

## 2019-09-12 DIAGNOSIS — Z4781 Encounter for orthopedic aftercare following surgical amputation: Secondary | ICD-10-CM | POA: Diagnosis not present

## 2019-09-12 DIAGNOSIS — B351 Tinea unguium: Secondary | ICD-10-CM | POA: Diagnosis not present

## 2019-09-12 DIAGNOSIS — Z992 Dependence on renal dialysis: Secondary | ICD-10-CM | POA: Diagnosis not present

## 2019-09-12 DIAGNOSIS — E1159 Type 2 diabetes mellitus with other circulatory complications: Secondary | ICD-10-CM | POA: Diagnosis not present

## 2019-09-12 DIAGNOSIS — Z89431 Acquired absence of right foot: Secondary | ICD-10-CM | POA: Diagnosis not present

## 2019-09-13 DIAGNOSIS — Z7902 Long term (current) use of antithrombotics/antiplatelets: Secondary | ICD-10-CM | POA: Diagnosis not present

## 2019-09-13 DIAGNOSIS — Z792 Long term (current) use of antibiotics: Secondary | ICD-10-CM | POA: Diagnosis not present

## 2019-09-13 DIAGNOSIS — Z4781 Encounter for orthopedic aftercare following surgical amputation: Secondary | ICD-10-CM | POA: Diagnosis not present

## 2019-09-13 DIAGNOSIS — Z89421 Acquired absence of other right toe(s): Secondary | ICD-10-CM | POA: Diagnosis not present

## 2019-09-13 DIAGNOSIS — E1122 Type 2 diabetes mellitus with diabetic chronic kidney disease: Secondary | ICD-10-CM | POA: Diagnosis not present

## 2019-09-13 DIAGNOSIS — Z7982 Long term (current) use of aspirin: Secondary | ICD-10-CM | POA: Diagnosis not present

## 2019-09-13 DIAGNOSIS — E1151 Type 2 diabetes mellitus with diabetic peripheral angiopathy without gangrene: Secondary | ICD-10-CM | POA: Diagnosis not present

## 2019-09-13 DIAGNOSIS — N2581 Secondary hyperparathyroidism of renal origin: Secondary | ICD-10-CM | POA: Diagnosis not present

## 2019-09-13 DIAGNOSIS — D509 Iron deficiency anemia, unspecified: Secondary | ICD-10-CM | POA: Diagnosis not present

## 2019-09-13 DIAGNOSIS — D631 Anemia in chronic kidney disease: Secondary | ICD-10-CM | POA: Diagnosis not present

## 2019-09-13 DIAGNOSIS — I120 Hypertensive chronic kidney disease with stage 5 chronic kidney disease or end stage renal disease: Secondary | ICD-10-CM | POA: Diagnosis not present

## 2019-09-13 DIAGNOSIS — Z79891 Long term (current) use of opiate analgesic: Secondary | ICD-10-CM | POA: Diagnosis not present

## 2019-09-13 DIAGNOSIS — Z992 Dependence on renal dialysis: Secondary | ICD-10-CM | POA: Diagnosis not present

## 2019-09-13 DIAGNOSIS — N186 End stage renal disease: Secondary | ICD-10-CM | POA: Diagnosis not present

## 2019-09-14 DIAGNOSIS — N2581 Secondary hyperparathyroidism of renal origin: Secondary | ICD-10-CM | POA: Diagnosis not present

## 2019-09-14 DIAGNOSIS — Z992 Dependence on renal dialysis: Secondary | ICD-10-CM | POA: Diagnosis not present

## 2019-09-14 DIAGNOSIS — D631 Anemia in chronic kidney disease: Secondary | ICD-10-CM | POA: Diagnosis not present

## 2019-09-14 DIAGNOSIS — D509 Iron deficiency anemia, unspecified: Secondary | ICD-10-CM | POA: Diagnosis not present

## 2019-09-14 DIAGNOSIS — N186 End stage renal disease: Secondary | ICD-10-CM | POA: Diagnosis not present

## 2019-09-19 DIAGNOSIS — D631 Anemia in chronic kidney disease: Secondary | ICD-10-CM | POA: Diagnosis not present

## 2019-09-19 DIAGNOSIS — Z992 Dependence on renal dialysis: Secondary | ICD-10-CM | POA: Diagnosis not present

## 2019-09-19 DIAGNOSIS — N186 End stage renal disease: Secondary | ICD-10-CM | POA: Diagnosis not present

## 2019-09-19 DIAGNOSIS — N2581 Secondary hyperparathyroidism of renal origin: Secondary | ICD-10-CM | POA: Diagnosis not present

## 2019-09-19 DIAGNOSIS — D509 Iron deficiency anemia, unspecified: Secondary | ICD-10-CM | POA: Diagnosis not present

## 2019-09-20 DIAGNOSIS — E11621 Type 2 diabetes mellitus with foot ulcer: Secondary | ICD-10-CM | POA: Diagnosis not present

## 2019-09-20 DIAGNOSIS — Z992 Dependence on renal dialysis: Secondary | ICD-10-CM | POA: Diagnosis not present

## 2019-09-20 DIAGNOSIS — L97512 Non-pressure chronic ulcer of other part of right foot with fat layer exposed: Secondary | ICD-10-CM | POA: Diagnosis not present

## 2019-09-20 DIAGNOSIS — E1129 Type 2 diabetes mellitus with other diabetic kidney complication: Secondary | ICD-10-CM | POA: Diagnosis not present

## 2019-09-20 DIAGNOSIS — N186 End stage renal disease: Secondary | ICD-10-CM | POA: Diagnosis not present

## 2019-09-21 DIAGNOSIS — D631 Anemia in chronic kidney disease: Secondary | ICD-10-CM | POA: Diagnosis not present

## 2019-09-21 DIAGNOSIS — Z4781 Encounter for orthopedic aftercare following surgical amputation: Secondary | ICD-10-CM | POA: Diagnosis not present

## 2019-09-21 DIAGNOSIS — I120 Hypertensive chronic kidney disease with stage 5 chronic kidney disease or end stage renal disease: Secondary | ICD-10-CM | POA: Diagnosis not present

## 2019-09-21 DIAGNOSIS — N186 End stage renal disease: Secondary | ICD-10-CM | POA: Diagnosis not present

## 2019-09-21 DIAGNOSIS — D509 Iron deficiency anemia, unspecified: Secondary | ICD-10-CM | POA: Diagnosis not present

## 2019-09-21 DIAGNOSIS — Z992 Dependence on renal dialysis: Secondary | ICD-10-CM | POA: Diagnosis not present

## 2019-09-21 DIAGNOSIS — E1151 Type 2 diabetes mellitus with diabetic peripheral angiopathy without gangrene: Secondary | ICD-10-CM | POA: Diagnosis not present

## 2019-09-21 DIAGNOSIS — N2581 Secondary hyperparathyroidism of renal origin: Secondary | ICD-10-CM | POA: Diagnosis not present

## 2019-09-21 DIAGNOSIS — E1122 Type 2 diabetes mellitus with diabetic chronic kidney disease: Secondary | ICD-10-CM | POA: Diagnosis not present

## 2019-09-23 DIAGNOSIS — Z992 Dependence on renal dialysis: Secondary | ICD-10-CM | POA: Diagnosis not present

## 2019-09-23 DIAGNOSIS — I120 Hypertensive chronic kidney disease with stage 5 chronic kidney disease or end stage renal disease: Secondary | ICD-10-CM | POA: Diagnosis not present

## 2019-09-23 DIAGNOSIS — E1151 Type 2 diabetes mellitus with diabetic peripheral angiopathy without gangrene: Secondary | ICD-10-CM | POA: Diagnosis not present

## 2019-09-23 DIAGNOSIS — Z4781 Encounter for orthopedic aftercare following surgical amputation: Secondary | ICD-10-CM | POA: Diagnosis not present

## 2019-09-23 DIAGNOSIS — N186 End stage renal disease: Secondary | ICD-10-CM | POA: Diagnosis not present

## 2019-09-23 DIAGNOSIS — E1122 Type 2 diabetes mellitus with diabetic chronic kidney disease: Secondary | ICD-10-CM | POA: Diagnosis not present

## 2019-09-26 DIAGNOSIS — Z4781 Encounter for orthopedic aftercare following surgical amputation: Secondary | ICD-10-CM | POA: Diagnosis not present

## 2019-09-26 DIAGNOSIS — Z992 Dependence on renal dialysis: Secondary | ICD-10-CM | POA: Diagnosis not present

## 2019-09-26 DIAGNOSIS — E1122 Type 2 diabetes mellitus with diabetic chronic kidney disease: Secondary | ICD-10-CM | POA: Diagnosis not present

## 2019-09-26 DIAGNOSIS — N186 End stage renal disease: Secondary | ICD-10-CM | POA: Diagnosis not present

## 2019-09-26 DIAGNOSIS — E1151 Type 2 diabetes mellitus with diabetic peripheral angiopathy without gangrene: Secondary | ICD-10-CM | POA: Diagnosis not present

## 2019-09-26 DIAGNOSIS — I120 Hypertensive chronic kidney disease with stage 5 chronic kidney disease or end stage renal disease: Secondary | ICD-10-CM | POA: Diagnosis not present

## 2019-09-27 NOTE — Progress Notes (Signed)
[place], [place] ([id number]) Visit Report for 06/23/2019 Arrival Information Details Patient Name: Date of Service: [name], [name] 06/23/2019 9:00 AM Medical Record [id number] Patient Account Number: [id number] Date of Birth/Sex: Treating RN: September 22, 1966 (53 y.o. [name]) Dolores Lory, Morey Hummingbird Primary Care Atheena Spano: Hayden Rasmussen Other Clinician: Referring Conita Amenta: Treating Lynell Greenhouse/Extender:Robson, Shade Flood, Penelope Galas in Treatment: 1 Visit Information History Since Last Visit All ordered tests and consults were completed: No Patient Arrived: Ambulatory Added or deleted any medications: No Arrival Time: 09:17 Any new allergies or adverse reactions: No Accompanied By: self Had a fall or experienced change in No Transfer Assistance: None activities of daily living that may affect Patient Identification Verified: Yes risk of falls: Secondary Verification Process Yes Signs or symptoms of abuse/neglect since last No Completed: visito Patient Requires Transmission- No Hospitalized since last visit: No Based Precautions: Implantable device outside of the clinic excluding No Patient Has Alerts: Yes cellular tissue based products placed in the center Patient Alerts: R ABI non since last visit: compressible Has Dressing in Place as Prescribed: Yes Has Compression in Place as Prescribed: Yes Pain Present Now: No Electronic Signature(s) Signed: 09/27/2019 3:04:22 PM By: [name] RN Entered By: [name] on 06/23/2019 09:18:20 -------------------------------------------------------------------------------- Clinic Level of Care Assessment Details Patient Name: Date of Service: Giovonni, Zaugg 06/23/2019 9:00 AM Medical Record [id number] Patient Account Number: [id number] Date of Birth/Sex: Treating RN: January 02, 1966 (53 y.o. Lorette Ang, Tammi Klippel Primary Care Jasneet Schobert: Hayden Rasmussen Other Clinician: Referring Amire Leazer: Treating Margarett Viti/Extender:Robson, Shade Flood,  Penelope Galas in Treatment: 1 Clinic Level of Care Assessment Items TOOL 4 Quantity Score X - Use when only an EandM is performed on FOLLOW-UP visit 1 0 ASSESSMENTS - Nursing Assessment / Reassessment X - Reassessment of Co-morbidities (includes updates in patient status) 1 10 X - Reassessment of Adherence to Treatment Plan 1 5 ASSESSMENTS - Wound and Skin Assessment / Reassessment X - Simple Wound Assessment / Reassessment - one wound 1 5 []  - Complex Wound Assessment / Reassessment - multiple wounds 0 X - Dermatologic / Skin Assessment (not related to wound area) 1 10 ASSESSMENTS - Focused Assessment X - Circumferential Edema Measurements - multi extremities 1 5 []  - Nutritional Assessment / Counseling / Intervention 0 []  - Lower Extremity Assessment (monofilament, tuning fork, pulses) 0 []  - Peripheral Arterial Disease Assessment (using hand held doppler) 0 ASSESSMENTS - Ostomy and/or Continence Assessment and Care []  - Incontinence Assessment and Management 0 []  - Ostomy Care Assessment and Management (repouching, etc.) 0 PROCESS - Coordination of Care X - Simple Patient / Family Education for ongoing care 1 15 []  - Complex (extensive) Patient / Family Education for ongoing care 0 X - Staff obtains Programmer, systems, Records, Test Results / Process Orders 1 10 X - Staff telephones HHA, Nursing Homes / Clarify orders / etc 1 10 []  - Routine Transfer to another Facility (non-emergent condition) 0 []  - Routine Hospital Admission (non-emergent condition) 0 []  - New Admissions / Biomedical engineer / Ordering NPWT, Apligraf, etc. 0 []  - Emergency Hospital Admission (emergent condition) 0 X - Simple Discharge Coordination 1 10 []  - Complex (extensive) Discharge Coordination 0 PROCESS - Special Needs []  - Pediatric / Minor Patient Management 0 []  - Isolation Patient Management 0 []  - Hearing / Language / Visual special needs 0 []  - Assessment of Community assistance (transportation, D/C  planning, etc.) 0 []  - Additional assistance / Altered mentation 0 []  - Support Surface(s) Assessment (bed, cushion, seat, etc.) 0 INTERVENTIONS -  Wound Cleansing / Measurement X - Simple Wound Cleansing - one wound 1 5 []  - Complex Wound Cleansing - multiple wounds 0 X - Wound Imaging (photographs - any number of wounds) 1 5 []  - Wound Tracing (instead of photographs) 0 X - Simple Wound Measurement - one wound 1 5 []  - Complex Wound Measurement - multiple wounds 0 INTERVENTIONS - Wound Dressings []  - Small Wound Dressing one or multiple wounds 0 X - Medium Wound Dressing one or multiple wounds 1 15 []  - Large Wound Dressing one or multiple wounds 0 []  - Application of Medications - topical 0 []  - Application of Medications - injection 0 INTERVENTIONS - Miscellaneous []  - External ear exam 0 []  - Specimen Collection (cultures, biopsies, blood, body fluids, etc.) 0 []  - Specimen(s) / Culture(s) sent or taken to Lab for analysis 0 []  - Patient Transfer (multiple staff / Civil Service fast streamer / Similar devices) 0 []  - Simple Staple / Suture removal (25 or less) 0 []  - Complex Staple / Suture removal (26 or more) 0 []  - Hypo / Hyperglycemic Management (close monitor of Blood Glucose) 0 []  - Ankle / Brachial Index (ABI) - do not check if billed separately 0 X - Vital Signs 1 5 Has the patient been seen at the hospital within the last three years: Yes Total Score: 115 Level Of Care: New/Established - Level 3 Electronic Signature(s) Signed: 06/23/2019 5:44:03 PM By: Deon Pilling Entered By: Deon Pilling on 06/23/2019 09:35:52 -------------------------------------------------------------------------------- Lower Extremity Assessment Details Patient Name: Date of Service: Boden, Valleau 06/23/2019 9:00 AM Medical Record [id number] Patient Account Number: [id number] Date of Birth/Sex: Treating RN: 27-Jul-1966 (53 y.o. [name] Primary Care Ronell Boldin: Hayden Rasmussen Other  Clinician: Referring Jarah Pember: Treating Shyenne Maggard/Extender:Robson, Shade Flood, Penelope Galas in Treatment: 1 Edema Assessment Assessed: [Left: No] [Right: No] Edema: [Left: Ye] [Right: s] Calf Left: Right: Point of Measurement: cm From Medial Instep cm 39 cm Ankle Left: Right: Point of Measurement: cm From Medial Instep cm 26 cm Electronic Signature(s) Signed: 09/27/2019 3:04:22 PM By: [name] RN Entered By: [name] on 06/23/2019 09:19:58 -------------------------------------------------------------------------------- Multi Wound Chart Details Patient Name: Date of Service: Valla Leaver 06/23/2019 9:00 AM Medical Record [id number] Patient Account Number: [id number] Date of Birth/Sex: Treating RN: 1966/06/25 (53 y.o. Lorette Ang, Tammi Klippel Primary Care Alasdair Kleve: Hayden Rasmussen Other Clinician: Referring Michaella Imai: Treating Marrio Scribner/Extender:Robson, Shade Flood, Penelope Galas in Treatment: 1 Vital Signs Height(in): 70 Capillary Blood 95 Glucose(mg/dl): Weight(lbs): 210 Pulse(bpm): 101 Body Mass Index(BMI): 30 Blood Pressure(mmHg): 196/91 Temperature(F): 98.4 Respiratory 18 Rate(breaths/min): Photos: [1:No Photos] [N/A:N/A] Wound Location: [1:Right Foot - Plantar] [N/A:N/A] Wounding Event: [1:Surgical Injury] [N/A:N/A] Primary Etiology: [1:Diabetic Wound/Ulcer of the N/A Lower Extremity] Comorbid History: [1:Hypertension, Type II Diabetes, End Stage Renal Disease, Osteomyelitis, Neuropathy] [N/A:N/A] Date Acquired: [1:05/27/2019] [N/A:N/A] Weeks of Treatment: [1:1] [N/A:N/A] Wound Status: [1:Open] [N/A:N/A] Measurements L x W x D 9.5x5x2.7 [N/A:N/A] (cm) Area (cm) : [1:37.306] [N/A:N/A] Volume (cm) : [1:100.727] [N/A:N/A] % Reduction in Area: [1:-34.60%] [N/A:N/A] % Reduction in Volume: -179.60% [N/A:N/A] Starting Position 1 12 (o'clock): Ending Position 1 [1:12] (o'clock): Maximum Distance 1 [1:2] (cm): Undermining: [1:Yes]  [N/A:N/A] Classification: [1:Grade 4] [N/A:N/A] Exudate Amount: [1:Medium] [N/A:N/A] Exudate Type: [1:Serosanguineous] [N/A:N/A] Exudate Color: [1:red, brown] [N/A:N/A] Wound Margin: [1:Well defined, not attached N/A] Granulation Amount: [1:Small (1-33%)] [N/A:N/A] Granulation Quality: [1:Pink] [N/A:N/A] Necrotic Amount: [1:Large (67-100%)] [N/A:N/A] Necrotic Tissue: [1:Eschar, Adherent Slough N/A] Exposed Structures: [1:Fat Layer (Subcutaneous N/A Tissue) Exposed: Yes Tendon: Yes Muscle: Yes  Bone: Yes Fascia: No Joint: No None] [N/A:N/A] Treatment Notes Electronic Signature(s) Signed: 06/23/2019 5:10:59 PM By: Linton Ham MD Signed: 06/23/2019 5:44:03 PM By: Deon Pilling Entered By: Linton Ham on 06/23/2019 09:47:59 -------------------------------------------------------------------------------- Multi-Disciplinary Care Plan Details Patient Name: Date of Service: Valla Leaver. 06/23/2019 9:00 AM Medical Record [id number] Patient Account Number: [id number] Date of Birth/Sex: Treating RN: [birth year] (53 y.o. Hessie Diener Primary Care Cliffard Hair: Hayden Rasmussen Other Clinician: Referring Audia Amick: Treating Seville Brick/Extender:Robson, Shade Flood, Penelope Galas in Treatment: 1 Active Inactive Electronic Signature(s) Signed: 07/06/2019 4:26:44 PM By: Deon Pilling Previous Signature: 06/23/2019 5:44:03 PM Version By: Deon Pilling Entered By: Deon Pilling on 07/06/2019 16:26:44 -------------------------------------------------------------------------------- Pain Assessment Details Patient Name: Date of Service: Ermil, Saxman 06/23/2019 9:00 AM Medical Record [id number] Patient Account Number: [id number] Date of Birth/Sex: Treating RN: 09/27/1966 (53 y.o. [name] Primary Care Milus Fritze: Hayden Rasmussen Other Clinician: Referring Alydia Gosser: Treating Hassie Mandt/Extender:Robson, Shade Flood, Penelope Galas in Treatment: 1 Active Problems Location of  Pain Severity and Description of Pain Patient Has Paino No Site Locations Pain Management and Medication Current Pain Management: Electronic Signature(s) Signed: 09/27/2019 3:04:22 PM By: [name] RN Entered By: [name] on 06/23/2019 09:19:14 -------------------------------------------------------------------------------- Patient/Caregiver Education Details Patient Name: Date of Service: Valla Leaver 9/3/2020andnbsp9:00 AM Medical Record [id number] Patient Account Number: [id number] Date of Birth/Gender: Treating RN: Mar 08, 1966 (53 y.o. Hessie Diener Primary Care Physician: Hayden Rasmussen Other Clinician: Referring Physician: Treating Physician/Extender:Robson, Shade Flood, Penelope Galas in Treatment: 1 Education Assessment Education Provided To: Patient Education Topics Provided Elevated Blood Sugar/ Impact on Healing: Handouts: Elevated Blood Sugars: How Do They Affect Wound Healing Methods: Explain/Verbal, Printed Responses: Reinforcements needed Electronic Signature(s) Signed: 06/23/2019 5:44:03 PM By: Deon Pilling Entered By: Deon Pilling on 06/23/2019 08:55:19 -------------------------------------------------------------------------------- Wound Assessment Details Patient Name: Date of Service: Xadrian, Maloof 06/23/2019 9:00 AM Medical Record [id number] Patient Account Number: [id number] Date of Birth/Sex: Treating RN: 1966/03/22 (53 y.o. [name]) [name] Primary Care Tandy Grawe: Hayden Rasmussen Other Clinician: Referring Kayana Thoen: Treating Mable Dara/Extender:Robson, Shade Flood, Penelope Galas in Treatment: 1 Wound Status Wound Number: 1 Primary Diabetic Wound/Ulcer of the Lower Extremity Etiology: Wound Location: Right Foot - Plantar Wound Open Wounding Event: Surgical Injury Status: Date Acquired: 05/27/2019 Comorbid Hypertension, Type II Diabetes, End Stage Weeks Of Treatment: 1 History: Renal Disease, Osteomyelitis,  Neuropathy Clustered Wound: No Photos Wound Measurements Length: (cm) 9.5 Width: (cm) 5 Depth: (cm) 2.7 Area: (cm) 37.306 Volume: (cm) 100.727 % Reduction in Area: -34.6% % Reduction in Volume: -179.6% Epithelialization: None Tunneling: No Undermining: Yes Starting Position (o'clock): 12 Ending Position (o'clock): 12 Maximum Distance: (cm) 2 Wound Description Classification: Grade 4 Foul Odor A Wound Margin: Well defined, not attached Slough/Fibr Exudate Amount: Medium Exudate Type: Serosanguineous Exudate Color: red, brown Wound Bed Granulation Amount: Small (1-33%) Granulation Quality: Pink Fascia Expos Necrotic Amount: Large (67-100%) Fat Layer (S Necrotic Quality: Eschar, Adherent Slough Tendon Expos Muscle Expos Necrosi Joint Expose Bone Exposed fter Cleansing: No ino Yes Exposed Structure ed: No ubcutaneous Tissue) Exposed: Yes ed: Yes ed: Yes s of Muscle: No d: No : Yes Electronic Signature(s) Signed: 06/24/2019 4:32:29 PM By: Mikeal Hawthorne EMT/HBOT Signed: 09/27/2019 3:04:22 PM By: [name] RN Entered By: Mikeal Hawthorne on 06/24/2019 09:29:59 -------------------------------------------------------------------------------- Vitals Details Patient Name: Date of Service: Valla Leaver 06/23/2019 9:00 AM Medical Record [id number] Patient Account Number: [id number] Date of Birth/Sex: Treating RN: 07/16/1966 (53 y.o. [name] Primary Care Jerin Franzel: Horald Pollen  L Other Clinician: Referring Rilley Poulter: Treating Azara Gemme/Extender:Robson, Shade Flood, Penelope Galas in Treatment: 1 Vital Signs Time Taken: 09:18 Temperature (F): 98.4 Height (in): 70 Pulse (bpm): 101 Weight (lbs): 210 Respiratory Rate (breaths/min): 18 Body Mass Index (BMI): 30.1 Blood Pressure (mmHg): 196/91 Capillary Blood Glucose (mg/dl): 95 Reference Range: 80 - 120 mg / dl Notes CBG per patient Electronic Signature(s) Signed: 09/27/2019 3:04:22 PM By: [name] RN Entered By: [name] on 06/23/2019 09:19:05

## 2019-09-28 DIAGNOSIS — E1122 Type 2 diabetes mellitus with diabetic chronic kidney disease: Secondary | ICD-10-CM | POA: Diagnosis not present

## 2019-09-28 DIAGNOSIS — N186 End stage renal disease: Secondary | ICD-10-CM | POA: Diagnosis not present

## 2019-09-28 DIAGNOSIS — Z4781 Encounter for orthopedic aftercare following surgical amputation: Secondary | ICD-10-CM | POA: Diagnosis not present

## 2019-09-28 DIAGNOSIS — I120 Hypertensive chronic kidney disease with stage 5 chronic kidney disease or end stage renal disease: Secondary | ICD-10-CM | POA: Diagnosis not present

## 2019-09-28 DIAGNOSIS — E1151 Type 2 diabetes mellitus with diabetic peripheral angiopathy without gangrene: Secondary | ICD-10-CM | POA: Diagnosis not present

## 2019-09-28 DIAGNOSIS — Z992 Dependence on renal dialysis: Secondary | ICD-10-CM | POA: Diagnosis not present

## 2019-09-29 DIAGNOSIS — L97512 Non-pressure chronic ulcer of other part of right foot with fat layer exposed: Secondary | ICD-10-CM | POA: Diagnosis not present

## 2019-09-29 DIAGNOSIS — E11621 Type 2 diabetes mellitus with foot ulcer: Secondary | ICD-10-CM | POA: Diagnosis not present

## 2019-09-30 DIAGNOSIS — E1122 Type 2 diabetes mellitus with diabetic chronic kidney disease: Secondary | ICD-10-CM | POA: Diagnosis not present

## 2019-09-30 DIAGNOSIS — Z4781 Encounter for orthopedic aftercare following surgical amputation: Secondary | ICD-10-CM | POA: Diagnosis not present

## 2019-09-30 DIAGNOSIS — N186 End stage renal disease: Secondary | ICD-10-CM | POA: Diagnosis not present

## 2019-09-30 DIAGNOSIS — I120 Hypertensive chronic kidney disease with stage 5 chronic kidney disease or end stage renal disease: Secondary | ICD-10-CM | POA: Diagnosis not present

## 2019-09-30 DIAGNOSIS — E1151 Type 2 diabetes mellitus with diabetic peripheral angiopathy without gangrene: Secondary | ICD-10-CM | POA: Diagnosis not present

## 2019-09-30 DIAGNOSIS — Z992 Dependence on renal dialysis: Secondary | ICD-10-CM | POA: Diagnosis not present

## 2019-10-01 ENCOUNTER — Other Ambulatory Visit: Payer: Self-pay

## 2019-10-01 DIAGNOSIS — Z20822 Contact with and (suspected) exposure to covid-19: Secondary | ICD-10-CM

## 2019-10-02 LAB — NOVEL CORONAVIRUS, NAA: SARS-CoV-2, NAA: NOT DETECTED

## 2019-10-03 DIAGNOSIS — E1151 Type 2 diabetes mellitus with diabetic peripheral angiopathy without gangrene: Secondary | ICD-10-CM | POA: Diagnosis not present

## 2019-10-03 DIAGNOSIS — Z992 Dependence on renal dialysis: Secondary | ICD-10-CM | POA: Diagnosis not present

## 2019-10-03 DIAGNOSIS — I120 Hypertensive chronic kidney disease with stage 5 chronic kidney disease or end stage renal disease: Secondary | ICD-10-CM | POA: Diagnosis not present

## 2019-10-03 DIAGNOSIS — E1122 Type 2 diabetes mellitus with diabetic chronic kidney disease: Secondary | ICD-10-CM | POA: Diagnosis not present

## 2019-10-03 DIAGNOSIS — N186 End stage renal disease: Secondary | ICD-10-CM | POA: Diagnosis not present

## 2019-10-03 DIAGNOSIS — Z4781 Encounter for orthopedic aftercare following surgical amputation: Secondary | ICD-10-CM | POA: Diagnosis not present

## 2019-10-06 DIAGNOSIS — L97512 Non-pressure chronic ulcer of other part of right foot with fat layer exposed: Secondary | ICD-10-CM | POA: Diagnosis not present

## 2019-10-06 DIAGNOSIS — E11621 Type 2 diabetes mellitus with foot ulcer: Secondary | ICD-10-CM | POA: Diagnosis not present

## 2019-10-07 DIAGNOSIS — N186 End stage renal disease: Secondary | ICD-10-CM | POA: Diagnosis not present

## 2019-10-07 DIAGNOSIS — Z992 Dependence on renal dialysis: Secondary | ICD-10-CM | POA: Diagnosis not present

## 2019-10-07 DIAGNOSIS — I120 Hypertensive chronic kidney disease with stage 5 chronic kidney disease or end stage renal disease: Secondary | ICD-10-CM | POA: Diagnosis not present

## 2019-10-07 DIAGNOSIS — E1151 Type 2 diabetes mellitus with diabetic peripheral angiopathy without gangrene: Secondary | ICD-10-CM | POA: Diagnosis not present

## 2019-10-07 DIAGNOSIS — Z4781 Encounter for orthopedic aftercare following surgical amputation: Secondary | ICD-10-CM | POA: Diagnosis not present

## 2019-10-07 DIAGNOSIS — E1122 Type 2 diabetes mellitus with diabetic chronic kidney disease: Secondary | ICD-10-CM | POA: Diagnosis not present

## 2019-10-10 DIAGNOSIS — N186 End stage renal disease: Secondary | ICD-10-CM | POA: Diagnosis not present

## 2019-10-10 DIAGNOSIS — E1151 Type 2 diabetes mellitus with diabetic peripheral angiopathy without gangrene: Secondary | ICD-10-CM | POA: Diagnosis not present

## 2019-10-10 DIAGNOSIS — E1122 Type 2 diabetes mellitus with diabetic chronic kidney disease: Secondary | ICD-10-CM | POA: Diagnosis not present

## 2019-10-10 DIAGNOSIS — Z992 Dependence on renal dialysis: Secondary | ICD-10-CM | POA: Diagnosis not present

## 2019-10-10 DIAGNOSIS — I120 Hypertensive chronic kidney disease with stage 5 chronic kidney disease or end stage renal disease: Secondary | ICD-10-CM | POA: Diagnosis not present

## 2019-10-10 DIAGNOSIS — Z4781 Encounter for orthopedic aftercare following surgical amputation: Secondary | ICD-10-CM | POA: Diagnosis not present

## 2019-10-13 DIAGNOSIS — Z7982 Long term (current) use of aspirin: Secondary | ICD-10-CM | POA: Diagnosis not present

## 2019-10-13 DIAGNOSIS — E11621 Type 2 diabetes mellitus with foot ulcer: Secondary | ICD-10-CM | POA: Diagnosis not present

## 2019-10-13 DIAGNOSIS — L97512 Non-pressure chronic ulcer of other part of right foot with fat layer exposed: Secondary | ICD-10-CM | POA: Diagnosis not present

## 2019-10-13 DIAGNOSIS — E1122 Type 2 diabetes mellitus with diabetic chronic kidney disease: Secondary | ICD-10-CM | POA: Diagnosis not present

## 2019-10-13 DIAGNOSIS — Z7902 Long term (current) use of antithrombotics/antiplatelets: Secondary | ICD-10-CM | POA: Diagnosis not present

## 2019-10-13 DIAGNOSIS — Z89421 Acquired absence of other right toe(s): Secondary | ICD-10-CM | POA: Diagnosis not present

## 2019-10-13 DIAGNOSIS — Z992 Dependence on renal dialysis: Secondary | ICD-10-CM | POA: Diagnosis not present

## 2019-10-13 DIAGNOSIS — I120 Hypertensive chronic kidney disease with stage 5 chronic kidney disease or end stage renal disease: Secondary | ICD-10-CM | POA: Diagnosis not present

## 2019-10-13 DIAGNOSIS — Z792 Long term (current) use of antibiotics: Secondary | ICD-10-CM | POA: Diagnosis not present

## 2019-10-13 DIAGNOSIS — Z4781 Encounter for orthopedic aftercare following surgical amputation: Secondary | ICD-10-CM | POA: Diagnosis not present

## 2019-10-13 DIAGNOSIS — N186 End stage renal disease: Secondary | ICD-10-CM | POA: Diagnosis not present

## 2019-10-13 DIAGNOSIS — E1151 Type 2 diabetes mellitus with diabetic peripheral angiopathy without gangrene: Secondary | ICD-10-CM | POA: Diagnosis not present

## 2019-10-13 DIAGNOSIS — Z79891 Long term (current) use of opiate analgesic: Secondary | ICD-10-CM | POA: Diagnosis not present

## 2019-10-17 DIAGNOSIS — I120 Hypertensive chronic kidney disease with stage 5 chronic kidney disease or end stage renal disease: Secondary | ICD-10-CM | POA: Diagnosis not present

## 2019-10-17 DIAGNOSIS — E1122 Type 2 diabetes mellitus with diabetic chronic kidney disease: Secondary | ICD-10-CM | POA: Diagnosis not present

## 2019-10-17 DIAGNOSIS — Z992 Dependence on renal dialysis: Secondary | ICD-10-CM | POA: Diagnosis not present

## 2019-10-17 DIAGNOSIS — E1151 Type 2 diabetes mellitus with diabetic peripheral angiopathy without gangrene: Secondary | ICD-10-CM | POA: Diagnosis not present

## 2019-10-17 DIAGNOSIS — N186 End stage renal disease: Secondary | ICD-10-CM | POA: Diagnosis not present

## 2019-10-17 DIAGNOSIS — Z4781 Encounter for orthopedic aftercare following surgical amputation: Secondary | ICD-10-CM | POA: Diagnosis not present

## 2019-10-19 DIAGNOSIS — Z4781 Encounter for orthopedic aftercare following surgical amputation: Secondary | ICD-10-CM | POA: Diagnosis not present

## 2019-10-19 DIAGNOSIS — E1151 Type 2 diabetes mellitus with diabetic peripheral angiopathy without gangrene: Secondary | ICD-10-CM | POA: Diagnosis not present

## 2019-10-19 DIAGNOSIS — Z992 Dependence on renal dialysis: Secondary | ICD-10-CM | POA: Diagnosis not present

## 2019-10-19 DIAGNOSIS — E1122 Type 2 diabetes mellitus with diabetic chronic kidney disease: Secondary | ICD-10-CM | POA: Diagnosis not present

## 2019-10-19 DIAGNOSIS — N186 End stage renal disease: Secondary | ICD-10-CM | POA: Diagnosis not present

## 2019-10-19 DIAGNOSIS — I120 Hypertensive chronic kidney disease with stage 5 chronic kidney disease or end stage renal disease: Secondary | ICD-10-CM | POA: Diagnosis not present

## 2019-10-20 DIAGNOSIS — N186 End stage renal disease: Secondary | ICD-10-CM | POA: Diagnosis not present

## 2019-10-20 DIAGNOSIS — N2581 Secondary hyperparathyroidism of renal origin: Secondary | ICD-10-CM | POA: Diagnosis not present

## 2019-10-20 DIAGNOSIS — E8779 Other fluid overload: Secondary | ICD-10-CM | POA: Diagnosis not present

## 2019-10-20 DIAGNOSIS — Z992 Dependence on renal dialysis: Secondary | ICD-10-CM | POA: Diagnosis not present

## 2019-10-21 DIAGNOSIS — E1129 Type 2 diabetes mellitus with other diabetic kidney complication: Secondary | ICD-10-CM | POA: Diagnosis not present

## 2019-10-21 DIAGNOSIS — D509 Iron deficiency anemia, unspecified: Secondary | ICD-10-CM | POA: Diagnosis not present

## 2019-10-21 DIAGNOSIS — E1122 Type 2 diabetes mellitus with diabetic chronic kidney disease: Secondary | ICD-10-CM | POA: Diagnosis not present

## 2019-10-21 DIAGNOSIS — N186 End stage renal disease: Secondary | ICD-10-CM | POA: Diagnosis not present

## 2019-10-21 DIAGNOSIS — Z992 Dependence on renal dialysis: Secondary | ICD-10-CM | POA: Diagnosis not present

## 2019-10-21 DIAGNOSIS — E1151 Type 2 diabetes mellitus with diabetic peripheral angiopathy without gangrene: Secondary | ICD-10-CM | POA: Diagnosis not present

## 2019-10-21 DIAGNOSIS — N2581 Secondary hyperparathyroidism of renal origin: Secondary | ICD-10-CM | POA: Diagnosis not present

## 2019-10-21 DIAGNOSIS — I120 Hypertensive chronic kidney disease with stage 5 chronic kidney disease or end stage renal disease: Secondary | ICD-10-CM | POA: Diagnosis not present

## 2019-10-21 DIAGNOSIS — D631 Anemia in chronic kidney disease: Secondary | ICD-10-CM | POA: Diagnosis not present

## 2019-10-21 DIAGNOSIS — Z4781 Encounter for orthopedic aftercare following surgical amputation: Secondary | ICD-10-CM | POA: Diagnosis not present

## 2019-10-24 DIAGNOSIS — D631 Anemia in chronic kidney disease: Secondary | ICD-10-CM | POA: Diagnosis not present

## 2019-10-24 DIAGNOSIS — E1122 Type 2 diabetes mellitus with diabetic chronic kidney disease: Secondary | ICD-10-CM | POA: Diagnosis not present

## 2019-10-24 DIAGNOSIS — N2581 Secondary hyperparathyroidism of renal origin: Secondary | ICD-10-CM | POA: Diagnosis not present

## 2019-10-24 DIAGNOSIS — N186 End stage renal disease: Secondary | ICD-10-CM | POA: Diagnosis not present

## 2019-10-24 DIAGNOSIS — I120 Hypertensive chronic kidney disease with stage 5 chronic kidney disease or end stage renal disease: Secondary | ICD-10-CM | POA: Diagnosis not present

## 2019-10-24 DIAGNOSIS — Z4781 Encounter for orthopedic aftercare following surgical amputation: Secondary | ICD-10-CM | POA: Diagnosis not present

## 2019-10-24 DIAGNOSIS — D509 Iron deficiency anemia, unspecified: Secondary | ICD-10-CM | POA: Diagnosis not present

## 2019-10-24 DIAGNOSIS — E1151 Type 2 diabetes mellitus with diabetic peripheral angiopathy without gangrene: Secondary | ICD-10-CM | POA: Diagnosis not present

## 2019-10-24 DIAGNOSIS — Z992 Dependence on renal dialysis: Secondary | ICD-10-CM | POA: Diagnosis not present

## 2019-10-26 DIAGNOSIS — N2581 Secondary hyperparathyroidism of renal origin: Secondary | ICD-10-CM | POA: Diagnosis not present

## 2019-10-26 DIAGNOSIS — E1151 Type 2 diabetes mellitus with diabetic peripheral angiopathy without gangrene: Secondary | ICD-10-CM | POA: Diagnosis not present

## 2019-10-26 DIAGNOSIS — Z4781 Encounter for orthopedic aftercare following surgical amputation: Secondary | ICD-10-CM | POA: Diagnosis not present

## 2019-10-26 DIAGNOSIS — D509 Iron deficiency anemia, unspecified: Secondary | ICD-10-CM | POA: Diagnosis not present

## 2019-10-26 DIAGNOSIS — D631 Anemia in chronic kidney disease: Secondary | ICD-10-CM | POA: Diagnosis not present

## 2019-10-26 DIAGNOSIS — N186 End stage renal disease: Secondary | ICD-10-CM | POA: Diagnosis not present

## 2019-10-26 DIAGNOSIS — I120 Hypertensive chronic kidney disease with stage 5 chronic kidney disease or end stage renal disease: Secondary | ICD-10-CM | POA: Diagnosis not present

## 2019-10-26 DIAGNOSIS — E1129 Type 2 diabetes mellitus with other diabetic kidney complication: Secondary | ICD-10-CM | POA: Diagnosis not present

## 2019-10-26 DIAGNOSIS — E1122 Type 2 diabetes mellitus with diabetic chronic kidney disease: Secondary | ICD-10-CM | POA: Diagnosis not present

## 2019-10-26 DIAGNOSIS — Z992 Dependence on renal dialysis: Secondary | ICD-10-CM | POA: Diagnosis not present

## 2019-10-27 DIAGNOSIS — E11621 Type 2 diabetes mellitus with foot ulcer: Secondary | ICD-10-CM | POA: Diagnosis not present

## 2019-10-27 DIAGNOSIS — L97512 Non-pressure chronic ulcer of other part of right foot with fat layer exposed: Secondary | ICD-10-CM | POA: Diagnosis not present

## 2019-10-28 DIAGNOSIS — N2581 Secondary hyperparathyroidism of renal origin: Secondary | ICD-10-CM | POA: Diagnosis not present

## 2019-10-28 DIAGNOSIS — Z992 Dependence on renal dialysis: Secondary | ICD-10-CM | POA: Diagnosis not present

## 2019-10-28 DIAGNOSIS — E1122 Type 2 diabetes mellitus with diabetic chronic kidney disease: Secondary | ICD-10-CM | POA: Diagnosis not present

## 2019-10-28 DIAGNOSIS — D509 Iron deficiency anemia, unspecified: Secondary | ICD-10-CM | POA: Diagnosis not present

## 2019-10-28 DIAGNOSIS — N186 End stage renal disease: Secondary | ICD-10-CM | POA: Diagnosis not present

## 2019-10-28 DIAGNOSIS — Z4781 Encounter for orthopedic aftercare following surgical amputation: Secondary | ICD-10-CM | POA: Diagnosis not present

## 2019-10-28 DIAGNOSIS — D631 Anemia in chronic kidney disease: Secondary | ICD-10-CM | POA: Diagnosis not present

## 2019-10-28 DIAGNOSIS — E1151 Type 2 diabetes mellitus with diabetic peripheral angiopathy without gangrene: Secondary | ICD-10-CM | POA: Diagnosis not present

## 2019-10-28 DIAGNOSIS — I120 Hypertensive chronic kidney disease with stage 5 chronic kidney disease or end stage renal disease: Secondary | ICD-10-CM | POA: Diagnosis not present

## 2019-10-31 DIAGNOSIS — E1151 Type 2 diabetes mellitus with diabetic peripheral angiopathy without gangrene: Secondary | ICD-10-CM | POA: Diagnosis not present

## 2019-10-31 DIAGNOSIS — N2581 Secondary hyperparathyroidism of renal origin: Secondary | ICD-10-CM | POA: Diagnosis not present

## 2019-10-31 DIAGNOSIS — N186 End stage renal disease: Secondary | ICD-10-CM | POA: Diagnosis not present

## 2019-10-31 DIAGNOSIS — I120 Hypertensive chronic kidney disease with stage 5 chronic kidney disease or end stage renal disease: Secondary | ICD-10-CM | POA: Diagnosis not present

## 2019-10-31 DIAGNOSIS — E1122 Type 2 diabetes mellitus with diabetic chronic kidney disease: Secondary | ICD-10-CM | POA: Diagnosis not present

## 2019-10-31 DIAGNOSIS — Z992 Dependence on renal dialysis: Secondary | ICD-10-CM | POA: Diagnosis not present

## 2019-10-31 DIAGNOSIS — D509 Iron deficiency anemia, unspecified: Secondary | ICD-10-CM | POA: Diagnosis not present

## 2019-10-31 DIAGNOSIS — D631 Anemia in chronic kidney disease: Secondary | ICD-10-CM | POA: Diagnosis not present

## 2019-10-31 DIAGNOSIS — Z4781 Encounter for orthopedic aftercare following surgical amputation: Secondary | ICD-10-CM | POA: Diagnosis not present

## 2019-10-31 DIAGNOSIS — T82858A Stenosis of vascular prosthetic devices, implants and grafts, initial encounter: Secondary | ICD-10-CM | POA: Diagnosis not present

## 2019-10-31 DIAGNOSIS — I871 Compression of vein: Secondary | ICD-10-CM | POA: Diagnosis not present

## 2019-11-02 DIAGNOSIS — Z4781 Encounter for orthopedic aftercare following surgical amputation: Secondary | ICD-10-CM | POA: Diagnosis not present

## 2019-11-02 DIAGNOSIS — D631 Anemia in chronic kidney disease: Secondary | ICD-10-CM | POA: Diagnosis not present

## 2019-11-02 DIAGNOSIS — N2581 Secondary hyperparathyroidism of renal origin: Secondary | ICD-10-CM | POA: Diagnosis not present

## 2019-11-02 DIAGNOSIS — I120 Hypertensive chronic kidney disease with stage 5 chronic kidney disease or end stage renal disease: Secondary | ICD-10-CM | POA: Diagnosis not present

## 2019-11-02 DIAGNOSIS — D509 Iron deficiency anemia, unspecified: Secondary | ICD-10-CM | POA: Diagnosis not present

## 2019-11-02 DIAGNOSIS — N186 End stage renal disease: Secondary | ICD-10-CM | POA: Diagnosis not present

## 2019-11-02 DIAGNOSIS — Z992 Dependence on renal dialysis: Secondary | ICD-10-CM | POA: Diagnosis not present

## 2019-11-02 DIAGNOSIS — E1122 Type 2 diabetes mellitus with diabetic chronic kidney disease: Secondary | ICD-10-CM | POA: Diagnosis not present

## 2019-11-02 DIAGNOSIS — E1151 Type 2 diabetes mellitus with diabetic peripheral angiopathy without gangrene: Secondary | ICD-10-CM | POA: Diagnosis not present

## 2019-11-03 DIAGNOSIS — L97512 Non-pressure chronic ulcer of other part of right foot with fat layer exposed: Secondary | ICD-10-CM | POA: Diagnosis not present

## 2019-11-03 DIAGNOSIS — E11621 Type 2 diabetes mellitus with foot ulcer: Secondary | ICD-10-CM | POA: Diagnosis not present

## 2019-11-04 DIAGNOSIS — D631 Anemia in chronic kidney disease: Secondary | ICD-10-CM | POA: Diagnosis not present

## 2019-11-04 DIAGNOSIS — Z992 Dependence on renal dialysis: Secondary | ICD-10-CM | POA: Diagnosis not present

## 2019-11-04 DIAGNOSIS — I120 Hypertensive chronic kidney disease with stage 5 chronic kidney disease or end stage renal disease: Secondary | ICD-10-CM | POA: Diagnosis not present

## 2019-11-04 DIAGNOSIS — E1122 Type 2 diabetes mellitus with diabetic chronic kidney disease: Secondary | ICD-10-CM | POA: Diagnosis not present

## 2019-11-04 DIAGNOSIS — Z4781 Encounter for orthopedic aftercare following surgical amputation: Secondary | ICD-10-CM | POA: Diagnosis not present

## 2019-11-04 DIAGNOSIS — E1151 Type 2 diabetes mellitus with diabetic peripheral angiopathy without gangrene: Secondary | ICD-10-CM | POA: Diagnosis not present

## 2019-11-04 DIAGNOSIS — N186 End stage renal disease: Secondary | ICD-10-CM | POA: Diagnosis not present

## 2019-11-04 DIAGNOSIS — N2581 Secondary hyperparathyroidism of renal origin: Secondary | ICD-10-CM | POA: Diagnosis not present

## 2019-11-04 DIAGNOSIS — D509 Iron deficiency anemia, unspecified: Secondary | ICD-10-CM | POA: Diagnosis not present

## 2019-11-06 ENCOUNTER — Other Ambulatory Visit: Payer: Self-pay

## 2019-11-06 ENCOUNTER — Ambulatory Visit (HOSPITAL_COMMUNITY)
Admission: EM | Admit: 2019-11-06 | Discharge: 2019-11-06 | Disposition: A | Discharge status: Home / Self Care | Payer: 59 | Attending: Family Medicine | Admitting: Family Medicine

## 2019-11-06 ENCOUNTER — Encounter (HOSPITAL_COMMUNITY): Payer: Self-pay | Admitting: Emergency Medicine

## 2019-11-06 DIAGNOSIS — K121 Other forms of stomatitis: Secondary | ICD-10-CM

## 2019-11-06 MED ORDER — MUPIROCIN CALCIUM 2 % NA OINT: TOPICAL_OINTMENT | NASAL | 1 refills | Status: DC

## 2019-11-06 NOTE — ED Provider Notes (Signed)
[place]    CSN: [id number] Arrival date & time: 11/06/19  1013      History   Chief Complaint Chief Complaint  Patient presents with  . Oral Swelling    HPI [name] is a 54 y.o. male.   HPI Patient has a painful swelling on his lip, left side lower lip, for the last several days.  It is not getting bigger.  It is not going away.  He has been trying Chapstick and some lubricants to keep it from cracking. He does not recall any trauma, biting his lip, burning from hot food. He does have a history of some new medications prior to this breaking out, he did have a procedure done with IV dye.  He only has the 1 lesion. He has never had cold sores or herpes lesions of any kind. He has no other rash, no fever chills, no body aches, no other symptoms. He is on dialysis and goes Monday Wednesday and Friday.  His blood pressure is elevated.  He states his blood pressure is always elevated.  He has concerns about taking blood pressure medication and does not always take them.  He will address this with his nephrologist at dialysis tomorrow Past Medical History:  Diagnosis Date  . Abscess of right foot   . Acute osteomyelitis of right foot ([place]) 05/15/2019  . Anemia 06/23/2019  . Cellulitis of right foot 05/22/2019  . Chest pain 08/23/2013  . Chills    at night - sometimes  . Chronic kidney disease, stage V ([place]) 08/23/2013  . Complication of anesthesia   . Diabetes mellitus    controlled by diet  . 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 Diabetic foot ulcer- right second toe 05/14/2019  . Diabetic infection of right foot ([place])   . Diabetic retinopathy associated with type 2 diabetes mellitus ([place]) 06/23/2019  . Eczema   . ESRD on dialysis ([place]) 07/18/2011  . Essential hypertension 10/18/2007   Qualifier: Diagnosis of  By: Loanne Drilling MD, Jacelyn Pi   . Fatigue    loss of fatigue  . FOOT ULCER, LEFT 06/29/2008   Qualifier: Diagnosis of  By: Loanne Drilling MD, Sean A   .  Gangrene of toe of right foot ([place])   . Headache(784.0)    Years ago  . Heart murmur    years ago  . Hemodialysis status ([place]) 08/23/2013  . History of blood transfusion   . Hyperglycemia 05/05/2015  . HYPERLIPIDEMIA 05/16/2008   Qualifier: Diagnosis of  By: Jenny Reichmann MD, Hunt Oris   . Hypertension    sees Dr. Jaci Standard  . Hypoglycemia, unspecified 11/13/2008   Qualifier: Diagnosis of  By: Jenny Reichmann MD, Hunt Oris   . MRSA (methicillin resistant staph aureus) culture positive   . MRSA INFECTION 10/18/2007   Qualifier: Diagnosis of  By: Marca Ancona RMA, Lucy    . Obesity, unspecified 08/23/2013  . Osteomyelitis of great toe of right foot ([place]) 05/14/2019  . Other forms of retinal detachment(361.89) 06/23/2019  . PONV (postoperative nausea and vomiting)   . Poor circulation   . Renal disorder   . RENAL INSUFFICIENCY 07/31/2008   Qualifier: Diagnosis of  By: Jenny Reichmann MD, Hunt Oris   . Secondary hyperparathyroidism ([place]) 06/23/2019  . Sepsis ([place]) 05/14/2019  . Type 2 diabetes mellitus ([place]) 08/23/2013  . Ulcer of right foot, with necrosis of bone ([place])   . Ulcer of right second toe with necrosis of bone [facility])     Patient Active  Problem List   Diagnosis Date Noted  . Anemia 06/23/2019  . Diabetic retinopathy associated with type 2 diabetes mellitus ([place]) 06/23/2019  . Other forms of retinal detachment(361.89) 06/23/2019  . Secondary hyperparathyroidism ([place]) 06/23/2019  . Gangrene of toe of right foot ([place])   . Ulcer of right foot, with necrosis of bone ([place])   . Abscess of right foot   . Diabetic infection of right foot ([place])   . Ulcer of right second toe with necrosis of bone ([place])   . Cellulitis of right foot 05/22/2019  . Acute osteomyelitis of right foot ([place]) 05/15/2019  . Osteomyelitis of great toe of right foot ([place]) 05/14/2019  . Diabetic foot ulcer- right second toe 05/14/2019  . Sepsis ([place]) 05/14/2019  . Hyperlipidemia 05/11/2015  . Hyperglycemia 05/05/2015  . Chest pain 08/23/2013  . Type 2  diabetes mellitus ([place]) 08/23/2013  . Hemodialysis status ([place]) 08/23/2013  . Chronic kidney disease, stage V ([place]) 08/23/2013  . Obesity, unspecified 08/23/2013  . ESRD on dialysis ([place]) 07/18/2011  . HYPOGLYCEMIA, UNSPECIFIED 11/13/2008  . RENAL INSUFFICIENCY 07/31/2008  . FOOT ULCER, LEFT 06/29/2008  . HYPERLIPIDEMIA 05/16/2008  . MRSA INFECTION 10/18/2007  . Essential hypertension 10/18/2007    Past Surgical History:  Procedure Laterality Date  . ABDOMINAL AORTOGRAM N/A 05/17/2019   Procedure: ABDOMINAL AORTOGRAM;  Surgeon: Elam Dutch, MD;  Location: [place] [facility];  Service: Cardiovascular;  Laterality: N/A;  . AMPUTATION TOE Right 05/14/2019   Procedure: PARTIAL AMPUTATION SECOND TOE RIGHT FOOT;  Surgeon: Evelina Bucy, DPM;  Location: [place];  Service: Podiatry;  Laterality: Right;  . AMPUTATION TOE Right 05/23/2019   Procedure: AMPUTATION OF SECOND TOE METATARSAL PHALANGEAL JOINT;  Surgeon: Evelina Bucy, DPM;  Location: [place];  Service: Podiatry;  Laterality: Right;  . AMPUTATION TOE Right 05/27/2019   Procedure: Amputation Toe;  Surgeon: Evelina Bucy, DPM;  Location: [place];  Service: Podiatry;  Laterality: Right;  right third toe  . APPLICATION OF WOUND VAC Right 05/27/2019   Procedure: Application Of Wound Vac;  Surgeon: Evelina Bucy, DPM;  Location: [place];  Service: Podiatry;  Laterality: Right;  . AV FISTULA PLACEMENT  06-18-11   Right brachiocephalic AVF  . BONE BIOPSY Right 05/14/2019   Procedure: OPEN SUPERFICIAL BONE BIOPSY GREAT TOE;  Surgeon: Evelina Bucy, DPM;  Location: [place];  Service: Podiatry;  Laterality: Right;  . EYE SURGERY     left eye for Laser, diabetic retinopathy  . FISTULOGRAM Right 11/06/2011   Procedure: FISTULOGRAM;  Surgeon: Conrad Dumas, MD;  [facility];  Service: Cardiovascular;  Laterality: Right;  . HEMATOMA EVACUATION Right Feb. 25, 2014  . HEMATOMA EVACUATION Right 12/14/2012   Procedure: EVACUATION HEMATOMA;   Surgeon: Conrad Berthold, MD;  Location: [place];  Service: Vascular;  Laterality: Right;  . INCISION AND DRAINAGE Right 05/23/2019   Procedure: INCISION AND DRAINAGE OF RIGHT FOOT DEEP SPACE ABSCESS;  Surgeon: Evelina Bucy, DPM;  Location: [place];  Service: Podiatry;  Laterality: Right;  . INSERTION OF DIALYSIS CATHETER  12/14/2012   Procedure: INSERTION OF DIALYSIS CATHETER;  Surgeon: Conrad Hanna, MD;  Location: [place];  Service: Vascular;;  . IRRIGATION AND DEBRIDEMENT FOOT Right 05/25/2019   Procedure: INCISION AND DRAINAGE FOOT;  Surgeon: Evelina Bucy, DPM;  Location: [place];  Service: Podiatry;  Laterality: Right;  . IRRIGATION AND DEBRIDEMENT FOOT Right 05/27/2019   Procedure: IRRIGATION AND DEBRIDEMENT FOOT;  Surgeon: Evelina Bucy, DPM;  Location: [place];  Service: Podiatry;  Laterality: Right;  . LOWER EXTREMITY ANGIOGRAPHY Bilateral 05/17/2019   Procedure: LOWER EXTREMITY ANGIOGRAPHY;  Surgeon: Elam Dutch, MD;  Location: [place] [facility];  Service: Cardiovascular;  Laterality: Bilateral;  . REVISON OF ARTERIOVENOUS FISTULA Right 12/14/2012   Procedure: REVISON OF upper arm ARTERIOVENOUS FISTULA using 67mx10cm gortex graft;  Surgeon: BConrad Gregory MD;  [place]  Service: Vascular;  Laterality: Right;  . SOFT TISSUE MASS EXCISION     Right arm, Left leg  for MRSA infection       Home Medications    Prior to Admission medications   Medication Sig Start Date End Date Taking? Authorizing Provider  ferric citrate (AURYXIA) 1 GM 210 MG(Fe) tablet Take 210-630 mg by mouth See admin instructions. Take 3 tablets (630 mg) by mouth up to three times daily with meals, take 1 tablet (210 mg) with snacks   Yes [provider]  Lanthanum Carbonate (FOSRENOL) 1000 MG PACK Take 1,000 mg by mouth 3 (three) times daily with meals.    Yes [provider]  aspirin EC 81 MG tablet Take 1 tablet (81 mg total) by mouth daily. 06/28/19   AWellington Hampshire MD  atorvastatin  (LIPITOR) 40 MG tablet Take 1 tablet (40 mg total) by mouth daily. 06/28/19 09/26/19  AWellington Hampshire MD  blood glucose meter kit and supplies Dispense based on patient and insurance preference. Use up to four times daily as directed. (FOR ICD-10 E10.9, E11.9). 05/18/19   Mikhail, MVelta Addison DO  blood glucose meter kit and supplies Dispense based on patient and insurance preference. Use up to four times daily as directed. (FOR ICD-9 250.00, 250.01). 05/18/19   MCristal Ford DO  Continuous Blood Gluc Sensor (FREESTYLE LIBRE 14 DAY SENSOR) MISC See admin instructions. 06/20/19   [provider]  glucose blood (ACCU-CHEK AVIVA PLUS) test strip Use as instructed 05/18/19   MCristal Ford DO  HYDROcodone-acetaminophen (NORCO) 10-325 MG tablet Take 1 tablet by mouth every 8 (eight) hours as needed (pain). 06/16/19   PEvelina Bucy DPM  Lancet Devices (ACCU-CHEK [facility] lancets Use as instructed 05/18/19   MCristal Ford DO  Melatonin 5 MG CAPS Take 10 mg by mouth at bedtime as needed (sleep).    [provider]  mupirocin nasal ointment (BACTROBAN) 2 % Apply small amount to lip 1 - 2 x a day 11/06/19   NRaylene Everts MD  OVER THE COUNTER MEDICATION Take 1 Bottle by mouth 2 (two) times a day. Vegan Protein Shake    [provider]  OVER THE COUNTER MEDICATION Take 1 tablet by mouth daily. ZYMESSEC (cholesterol reducing enzyme)    [provider]  loratadine (CLARITIN) 10 MG tablet Take 10 mg by mouth daily as needed for allergies.  11/06/19  [provider]    Family History Family History  Problem Relation Age of Onset  . Diabetes Mother     Social History Social History   Tobacco Use  . Smoking status: Never Smoker  . Smokeless tobacco: Never Used  Substance Use Topics  . Alcohol use: No  . Drug use: No     Allergies   Amoxicillin, Hydromorphone, and Percocet [oxycodone-acetaminophen]   Review of Systems Review of Systems    Constitutional: Negative for chills and fever.  HENT: Positive for mouth sores.   Musculoskeletal: Negative for myalgias.  Neurological: Negative for headaches.  Physical Exam Triage Vital Signs ED Triage Vitals  Enc Vitals Group     BP 11/06/19 1040 (!) 191/102     Pulse Rate 11/06/19 1040 (!) 106     Resp 11/06/19 1040 (!) 23     Temp 11/06/19 1040 97.6 F (36.4 C)     Temp Source 11/06/19 1040 Oral     SpO2 11/06/19 1040 98 %     Weight --      Height --      Head Circumference --      Peak Flow --      Pain Score 11/06/19 1035 0     Pain Loc --      Pain Edu? --      Excl. in [place]? --    No data found.  Updated Vital Signs BP (!) 191/102 (BP Location: Left Arm) Comment: patient reports this is typical  Pulse (!) 106   Temp 97.6 F (36.4 C) (Oral)   Resp (!) 23   SpO2 98%       Physical Exam Constitutional:      General: He is not in acute distress.    Appearance: He is well-developed.  HENT:     Head: Normocephalic and atraumatic.     Mouth/Throat:     Comments: See photo Cardiovascular:     Rate and Rhythm: Normal rate.  Pulmonary:     Effort: Pulmonary effort is normal. No respiratory distress.  Musculoskeletal:        General: Normal range of motion.     Cervical back: Normal range of motion.  Skin:    General: Skin is warm and dry.     Findings: Lesion present.  Neurological:     Mental Status: He is alert.  Psychiatric:        Mood and Affect: Mood normal.        Behavior: Behavior normal.        UC Treatments / Results  Labs (all labs ordered are listed, but only abnormal results are displayed) Labs Reviewed - No data to display  EKG   Radiology No results found.  Procedures Procedures (including critical care time)  Medications Ordered in UC Medications - No data to display  Initial Impression / Assessment and Plan / UC Course  I have reviewed the triage vital signs and the nursing notes.  Pertinent labs & imaging  results that were available during my care of the patient were reviewed by me and considered in my medical decision making (see chart for details).     This looks like a ulcer with an eschar.  Because its moistness has a yellow appearance.  I do not see any obvious infection but will treat with mupirocin to prevent, history of MRSA.  Patient should see his PCP if not improved by next week Final Clinical Impressions(s) / UC Diagnoses   Final diagnoses:  Mouth ulcer     Discharge Instructions     Apply mupirocin to lip to reduce infection Call or return for problems     ED Prescriptions    Medication Sig Dispense Auth. Provider   mupirocin nasal ointment (BACTROBAN) 2 % Apply small amount to lip 1 - 2 x a day 1 g Raylene Everts, MD     PDMP not reviewed this encounter.   Raylene Everts, MD 11/06/19 1135

## 2019-11-06 NOTE — Discharge Instructions (Addendum)
Apply mupirocin to lip to reduce infection Call or return for problems

## 2019-11-06 NOTE — ED Triage Notes (Signed)
Patient had graft surgery in right arm on Monday.  Tuesday morning, patient reports swelling of left side of mouth and since then it dries and cracks.  Area is lower lip, left side of lip that is cracking and scabbing

## 2019-11-07 DIAGNOSIS — N186 End stage renal disease: Secondary | ICD-10-CM | POA: Diagnosis not present

## 2019-11-07 DIAGNOSIS — Z992 Dependence on renal dialysis: Secondary | ICD-10-CM | POA: Diagnosis not present

## 2019-11-07 DIAGNOSIS — D631 Anemia in chronic kidney disease: Secondary | ICD-10-CM | POA: Diagnosis not present

## 2019-11-07 DIAGNOSIS — E1122 Type 2 diabetes mellitus with diabetic chronic kidney disease: Secondary | ICD-10-CM | POA: Diagnosis not present

## 2019-11-07 DIAGNOSIS — E1151 Type 2 diabetes mellitus with diabetic peripheral angiopathy without gangrene: Secondary | ICD-10-CM | POA: Diagnosis not present

## 2019-11-07 DIAGNOSIS — N2581 Secondary hyperparathyroidism of renal origin: Secondary | ICD-10-CM | POA: Diagnosis not present

## 2019-11-07 DIAGNOSIS — I120 Hypertensive chronic kidney disease with stage 5 chronic kidney disease or end stage renal disease: Secondary | ICD-10-CM | POA: Diagnosis not present

## 2019-11-07 DIAGNOSIS — Z4781 Encounter for orthopedic aftercare following surgical amputation: Secondary | ICD-10-CM | POA: Diagnosis not present

## 2019-11-07 DIAGNOSIS — D509 Iron deficiency anemia, unspecified: Secondary | ICD-10-CM | POA: Diagnosis not present

## 2019-11-09 DIAGNOSIS — D509 Iron deficiency anemia, unspecified: Secondary | ICD-10-CM | POA: Diagnosis not present

## 2019-11-09 DIAGNOSIS — E1151 Type 2 diabetes mellitus with diabetic peripheral angiopathy without gangrene: Secondary | ICD-10-CM | POA: Diagnosis not present

## 2019-11-09 DIAGNOSIS — Z992 Dependence on renal dialysis: Secondary | ICD-10-CM | POA: Diagnosis not present

## 2019-11-09 DIAGNOSIS — I120 Hypertensive chronic kidney disease with stage 5 chronic kidney disease or end stage renal disease: Secondary | ICD-10-CM | POA: Diagnosis not present

## 2019-11-09 DIAGNOSIS — E1122 Type 2 diabetes mellitus with diabetic chronic kidney disease: Secondary | ICD-10-CM | POA: Diagnosis not present

## 2019-11-09 DIAGNOSIS — N2581 Secondary hyperparathyroidism of renal origin: Secondary | ICD-10-CM | POA: Diagnosis not present

## 2019-11-09 DIAGNOSIS — Z4781 Encounter for orthopedic aftercare following surgical amputation: Secondary | ICD-10-CM | POA: Diagnosis not present

## 2019-11-09 DIAGNOSIS — N186 End stage renal disease: Secondary | ICD-10-CM | POA: Diagnosis not present

## 2019-11-09 DIAGNOSIS — D631 Anemia in chronic kidney disease: Secondary | ICD-10-CM | POA: Diagnosis not present

## 2019-11-10 DIAGNOSIS — E11621 Type 2 diabetes mellitus with foot ulcer: Secondary | ICD-10-CM | POA: Diagnosis not present

## 2019-11-10 DIAGNOSIS — L97512 Non-pressure chronic ulcer of other part of right foot with fat layer exposed: Secondary | ICD-10-CM | POA: Diagnosis not present

## 2019-11-11 DIAGNOSIS — Z4781 Encounter for orthopedic aftercare following surgical amputation: Secondary | ICD-10-CM | POA: Diagnosis not present

## 2019-11-11 DIAGNOSIS — N186 End stage renal disease: Secondary | ICD-10-CM | POA: Diagnosis not present

## 2019-11-11 DIAGNOSIS — Z992 Dependence on renal dialysis: Secondary | ICD-10-CM | POA: Diagnosis not present

## 2019-11-11 DIAGNOSIS — D509 Iron deficiency anemia, unspecified: Secondary | ICD-10-CM | POA: Diagnosis not present

## 2019-11-11 DIAGNOSIS — I120 Hypertensive chronic kidney disease with stage 5 chronic kidney disease or end stage renal disease: Secondary | ICD-10-CM | POA: Diagnosis not present

## 2019-11-11 DIAGNOSIS — E1151 Type 2 diabetes mellitus with diabetic peripheral angiopathy without gangrene: Secondary | ICD-10-CM | POA: Diagnosis not present

## 2019-11-11 DIAGNOSIS — D631 Anemia in chronic kidney disease: Secondary | ICD-10-CM | POA: Diagnosis not present

## 2019-11-11 DIAGNOSIS — E1122 Type 2 diabetes mellitus with diabetic chronic kidney disease: Secondary | ICD-10-CM | POA: Diagnosis not present

## 2019-11-11 DIAGNOSIS — N2581 Secondary hyperparathyroidism of renal origin: Secondary | ICD-10-CM | POA: Diagnosis not present

## 2019-11-12 DIAGNOSIS — E1151 Type 2 diabetes mellitus with diabetic peripheral angiopathy without gangrene: Secondary | ICD-10-CM | POA: Diagnosis not present

## 2019-11-12 DIAGNOSIS — Z89421 Acquired absence of other right toe(s): Secondary | ICD-10-CM | POA: Diagnosis not present

## 2019-11-12 DIAGNOSIS — I120 Hypertensive chronic kidney disease with stage 5 chronic kidney disease or end stage renal disease: Secondary | ICD-10-CM | POA: Diagnosis not present

## 2019-11-12 DIAGNOSIS — Z4781 Encounter for orthopedic aftercare following surgical amputation: Secondary | ICD-10-CM | POA: Diagnosis not present

## 2019-11-12 DIAGNOSIS — N186 End stage renal disease: Secondary | ICD-10-CM | POA: Diagnosis not present

## 2019-11-12 DIAGNOSIS — Z7902 Long term (current) use of antithrombotics/antiplatelets: Secondary | ICD-10-CM | POA: Diagnosis not present

## 2019-11-12 DIAGNOSIS — Z992 Dependence on renal dialysis: Secondary | ICD-10-CM | POA: Diagnosis not present

## 2019-11-12 DIAGNOSIS — E1122 Type 2 diabetes mellitus with diabetic chronic kidney disease: Secondary | ICD-10-CM | POA: Diagnosis not present

## 2019-11-14 DIAGNOSIS — D631 Anemia in chronic kidney disease: Secondary | ICD-10-CM | POA: Diagnosis not present

## 2019-11-14 DIAGNOSIS — N2581 Secondary hyperparathyroidism of renal origin: Secondary | ICD-10-CM | POA: Diagnosis not present

## 2019-11-14 DIAGNOSIS — D509 Iron deficiency anemia, unspecified: Secondary | ICD-10-CM | POA: Diagnosis not present

## 2019-11-14 DIAGNOSIS — N186 End stage renal disease: Secondary | ICD-10-CM | POA: Diagnosis not present

## 2019-11-14 DIAGNOSIS — Z992 Dependence on renal dialysis: Secondary | ICD-10-CM | POA: Diagnosis not present

## 2019-11-16 DIAGNOSIS — N2581 Secondary hyperparathyroidism of renal origin: Secondary | ICD-10-CM | POA: Diagnosis not present

## 2019-11-16 DIAGNOSIS — N186 End stage renal disease: Secondary | ICD-10-CM | POA: Diagnosis not present

## 2019-11-16 DIAGNOSIS — D509 Iron deficiency anemia, unspecified: Secondary | ICD-10-CM | POA: Diagnosis not present

## 2019-11-16 DIAGNOSIS — Z992 Dependence on renal dialysis: Secondary | ICD-10-CM | POA: Diagnosis not present

## 2019-11-16 DIAGNOSIS — D631 Anemia in chronic kidney disease: Secondary | ICD-10-CM | POA: Diagnosis not present

## 2019-11-18 DIAGNOSIS — Z992 Dependence on renal dialysis: Secondary | ICD-10-CM | POA: Diagnosis not present

## 2019-11-18 DIAGNOSIS — E1122 Type 2 diabetes mellitus with diabetic chronic kidney disease: Secondary | ICD-10-CM | POA: Diagnosis not present

## 2019-11-18 DIAGNOSIS — D509 Iron deficiency anemia, unspecified: Secondary | ICD-10-CM | POA: Diagnosis not present

## 2019-11-18 DIAGNOSIS — D631 Anemia in chronic kidney disease: Secondary | ICD-10-CM | POA: Diagnosis not present

## 2019-11-18 DIAGNOSIS — E1151 Type 2 diabetes mellitus with diabetic peripheral angiopathy without gangrene: Secondary | ICD-10-CM | POA: Diagnosis not present

## 2019-11-18 DIAGNOSIS — Z4781 Encounter for orthopedic aftercare following surgical amputation: Secondary | ICD-10-CM | POA: Diagnosis not present

## 2019-11-18 DIAGNOSIS — N186 End stage renal disease: Secondary | ICD-10-CM | POA: Diagnosis not present

## 2019-11-18 DIAGNOSIS — I120 Hypertensive chronic kidney disease with stage 5 chronic kidney disease or end stage renal disease: Secondary | ICD-10-CM | POA: Diagnosis not present

## 2019-11-18 DIAGNOSIS — N2581 Secondary hyperparathyroidism of renal origin: Secondary | ICD-10-CM | POA: Diagnosis not present

## 2019-11-21 DIAGNOSIS — E1122 Type 2 diabetes mellitus with diabetic chronic kidney disease: Secondary | ICD-10-CM | POA: Diagnosis not present

## 2019-11-21 DIAGNOSIS — N2581 Secondary hyperparathyroidism of renal origin: Secondary | ICD-10-CM | POA: Diagnosis not present

## 2019-11-21 DIAGNOSIS — D509 Iron deficiency anemia, unspecified: Secondary | ICD-10-CM | POA: Diagnosis not present

## 2019-11-21 DIAGNOSIS — I120 Hypertensive chronic kidney disease with stage 5 chronic kidney disease or end stage renal disease: Secondary | ICD-10-CM | POA: Diagnosis not present

## 2019-11-21 DIAGNOSIS — E1129 Type 2 diabetes mellitus with other diabetic kidney complication: Secondary | ICD-10-CM | POA: Diagnosis not present

## 2019-11-21 DIAGNOSIS — N186 End stage renal disease: Secondary | ICD-10-CM | POA: Diagnosis not present

## 2019-11-21 DIAGNOSIS — L03115 Cellulitis of right lower limb: Secondary | ICD-10-CM | POA: Diagnosis not present

## 2019-11-21 DIAGNOSIS — D631 Anemia in chronic kidney disease: Secondary | ICD-10-CM | POA: Diagnosis not present

## 2019-11-21 DIAGNOSIS — Z992 Dependence on renal dialysis: Secondary | ICD-10-CM | POA: Diagnosis not present

## 2019-11-21 DIAGNOSIS — E1151 Type 2 diabetes mellitus with diabetic peripheral angiopathy without gangrene: Secondary | ICD-10-CM | POA: Diagnosis not present

## 2019-11-21 DIAGNOSIS — Z4781 Encounter for orthopedic aftercare following surgical amputation: Secondary | ICD-10-CM | POA: Diagnosis not present

## 2019-11-23 DIAGNOSIS — N186 End stage renal disease: Secondary | ICD-10-CM | POA: Diagnosis not present

## 2019-11-23 DIAGNOSIS — Z992 Dependence on renal dialysis: Secondary | ICD-10-CM | POA: Diagnosis not present

## 2019-11-23 DIAGNOSIS — E1122 Type 2 diabetes mellitus with diabetic chronic kidney disease: Secondary | ICD-10-CM | POA: Diagnosis not present

## 2019-11-23 DIAGNOSIS — Z4781 Encounter for orthopedic aftercare following surgical amputation: Secondary | ICD-10-CM | POA: Diagnosis not present

## 2019-11-23 DIAGNOSIS — E1151 Type 2 diabetes mellitus with diabetic peripheral angiopathy without gangrene: Secondary | ICD-10-CM | POA: Diagnosis not present

## 2019-11-23 DIAGNOSIS — N2581 Secondary hyperparathyroidism of renal origin: Secondary | ICD-10-CM | POA: Diagnosis not present

## 2019-11-23 DIAGNOSIS — L03115 Cellulitis of right lower limb: Secondary | ICD-10-CM | POA: Diagnosis not present

## 2019-11-23 DIAGNOSIS — D631 Anemia in chronic kidney disease: Secondary | ICD-10-CM | POA: Diagnosis not present

## 2019-11-23 DIAGNOSIS — D509 Iron deficiency anemia, unspecified: Secondary | ICD-10-CM | POA: Diagnosis not present

## 2019-11-23 DIAGNOSIS — I120 Hypertensive chronic kidney disease with stage 5 chronic kidney disease or end stage renal disease: Secondary | ICD-10-CM | POA: Diagnosis not present

## 2019-11-24 DIAGNOSIS — E11621 Type 2 diabetes mellitus with foot ulcer: Secondary | ICD-10-CM | POA: Diagnosis not present

## 2019-11-24 DIAGNOSIS — L97512 Non-pressure chronic ulcer of other part of right foot with fat layer exposed: Secondary | ICD-10-CM | POA: Diagnosis not present

## 2019-11-25 DIAGNOSIS — D509 Iron deficiency anemia, unspecified: Secondary | ICD-10-CM | POA: Diagnosis not present

## 2019-11-25 DIAGNOSIS — D631 Anemia in chronic kidney disease: Secondary | ICD-10-CM | POA: Diagnosis not present

## 2019-11-25 DIAGNOSIS — N186 End stage renal disease: Secondary | ICD-10-CM | POA: Diagnosis not present

## 2019-11-25 DIAGNOSIS — L03115 Cellulitis of right lower limb: Secondary | ICD-10-CM | POA: Diagnosis not present

## 2019-11-25 DIAGNOSIS — E1151 Type 2 diabetes mellitus with diabetic peripheral angiopathy without gangrene: Secondary | ICD-10-CM | POA: Diagnosis not present

## 2019-11-25 DIAGNOSIS — N2581 Secondary hyperparathyroidism of renal origin: Secondary | ICD-10-CM | POA: Diagnosis not present

## 2019-11-25 DIAGNOSIS — I120 Hypertensive chronic kidney disease with stage 5 chronic kidney disease or end stage renal disease: Secondary | ICD-10-CM | POA: Diagnosis not present

## 2019-11-25 DIAGNOSIS — Z992 Dependence on renal dialysis: Secondary | ICD-10-CM | POA: Diagnosis not present

## 2019-11-25 DIAGNOSIS — E1122 Type 2 diabetes mellitus with diabetic chronic kidney disease: Secondary | ICD-10-CM | POA: Diagnosis not present

## 2019-11-25 DIAGNOSIS — Z4781 Encounter for orthopedic aftercare following surgical amputation: Secondary | ICD-10-CM | POA: Diagnosis not present

## 2019-11-28 DIAGNOSIS — E1151 Type 2 diabetes mellitus with diabetic peripheral angiopathy without gangrene: Secondary | ICD-10-CM | POA: Diagnosis not present

## 2019-11-28 DIAGNOSIS — Z992 Dependence on renal dialysis: Secondary | ICD-10-CM | POA: Diagnosis not present

## 2019-11-28 DIAGNOSIS — I120 Hypertensive chronic kidney disease with stage 5 chronic kidney disease or end stage renal disease: Secondary | ICD-10-CM | POA: Diagnosis not present

## 2019-11-28 DIAGNOSIS — E1122 Type 2 diabetes mellitus with diabetic chronic kidney disease: Secondary | ICD-10-CM | POA: Diagnosis not present

## 2019-11-28 DIAGNOSIS — N2581 Secondary hyperparathyroidism of renal origin: Secondary | ICD-10-CM | POA: Diagnosis not present

## 2019-11-28 DIAGNOSIS — L03115 Cellulitis of right lower limb: Secondary | ICD-10-CM | POA: Diagnosis not present

## 2019-11-28 DIAGNOSIS — N186 End stage renal disease: Secondary | ICD-10-CM | POA: Diagnosis not present

## 2019-11-28 DIAGNOSIS — D631 Anemia in chronic kidney disease: Secondary | ICD-10-CM | POA: Diagnosis not present

## 2019-11-28 DIAGNOSIS — Z4781 Encounter for orthopedic aftercare following surgical amputation: Secondary | ICD-10-CM | POA: Diagnosis not present

## 2019-11-28 DIAGNOSIS — D509 Iron deficiency anemia, unspecified: Secondary | ICD-10-CM | POA: Diagnosis not present

## 2019-11-30 DIAGNOSIS — Z992 Dependence on renal dialysis: Secondary | ICD-10-CM | POA: Diagnosis not present

## 2019-11-30 DIAGNOSIS — N186 End stage renal disease: Secondary | ICD-10-CM | POA: Diagnosis not present

## 2019-11-30 DIAGNOSIS — L03115 Cellulitis of right lower limb: Secondary | ICD-10-CM | POA: Diagnosis not present

## 2019-11-30 DIAGNOSIS — D631 Anemia in chronic kidney disease: Secondary | ICD-10-CM | POA: Diagnosis not present

## 2019-11-30 DIAGNOSIS — N2581 Secondary hyperparathyroidism of renal origin: Secondary | ICD-10-CM | POA: Diagnosis not present

## 2019-11-30 DIAGNOSIS — D509 Iron deficiency anemia, unspecified: Secondary | ICD-10-CM | POA: Diagnosis not present

## 2019-12-02 DIAGNOSIS — Z4781 Encounter for orthopedic aftercare following surgical amputation: Secondary | ICD-10-CM | POA: Diagnosis not present

## 2019-12-02 DIAGNOSIS — N186 End stage renal disease: Secondary | ICD-10-CM | POA: Diagnosis not present

## 2019-12-02 DIAGNOSIS — I120 Hypertensive chronic kidney disease with stage 5 chronic kidney disease or end stage renal disease: Secondary | ICD-10-CM | POA: Diagnosis not present

## 2019-12-02 DIAGNOSIS — N2581 Secondary hyperparathyroidism of renal origin: Secondary | ICD-10-CM | POA: Diagnosis not present

## 2019-12-02 DIAGNOSIS — D631 Anemia in chronic kidney disease: Secondary | ICD-10-CM | POA: Diagnosis not present

## 2019-12-02 DIAGNOSIS — L03115 Cellulitis of right lower limb: Secondary | ICD-10-CM | POA: Diagnosis not present

## 2019-12-02 DIAGNOSIS — E1151 Type 2 diabetes mellitus with diabetic peripheral angiopathy without gangrene: Secondary | ICD-10-CM | POA: Diagnosis not present

## 2019-12-02 DIAGNOSIS — E1122 Type 2 diabetes mellitus with diabetic chronic kidney disease: Secondary | ICD-10-CM | POA: Diagnosis not present

## 2019-12-02 DIAGNOSIS — D509 Iron deficiency anemia, unspecified: Secondary | ICD-10-CM | POA: Diagnosis not present

## 2019-12-02 DIAGNOSIS — Z992 Dependence on renal dialysis: Secondary | ICD-10-CM | POA: Diagnosis not present

## 2019-12-05 DIAGNOSIS — I120 Hypertensive chronic kidney disease with stage 5 chronic kidney disease or end stage renal disease: Secondary | ICD-10-CM | POA: Diagnosis not present

## 2019-12-05 DIAGNOSIS — E1122 Type 2 diabetes mellitus with diabetic chronic kidney disease: Secondary | ICD-10-CM | POA: Diagnosis not present

## 2019-12-05 DIAGNOSIS — D509 Iron deficiency anemia, unspecified: Secondary | ICD-10-CM | POA: Diagnosis not present

## 2019-12-05 DIAGNOSIS — L03115 Cellulitis of right lower limb: Secondary | ICD-10-CM | POA: Diagnosis not present

## 2019-12-05 DIAGNOSIS — D631 Anemia in chronic kidney disease: Secondary | ICD-10-CM | POA: Diagnosis not present

## 2019-12-05 DIAGNOSIS — N2581 Secondary hyperparathyroidism of renal origin: Secondary | ICD-10-CM | POA: Diagnosis not present

## 2019-12-05 DIAGNOSIS — E1151 Type 2 diabetes mellitus with diabetic peripheral angiopathy without gangrene: Secondary | ICD-10-CM | POA: Diagnosis not present

## 2019-12-05 DIAGNOSIS — Z992 Dependence on renal dialysis: Secondary | ICD-10-CM | POA: Diagnosis not present

## 2019-12-05 DIAGNOSIS — Z4781 Encounter for orthopedic aftercare following surgical amputation: Secondary | ICD-10-CM | POA: Diagnosis not present

## 2019-12-05 DIAGNOSIS — N186 End stage renal disease: Secondary | ICD-10-CM | POA: Diagnosis not present

## 2019-12-07 DIAGNOSIS — N2581 Secondary hyperparathyroidism of renal origin: Secondary | ICD-10-CM | POA: Diagnosis not present

## 2019-12-07 DIAGNOSIS — D509 Iron deficiency anemia, unspecified: Secondary | ICD-10-CM | POA: Diagnosis not present

## 2019-12-07 DIAGNOSIS — N186 End stage renal disease: Secondary | ICD-10-CM | POA: Diagnosis not present

## 2019-12-07 DIAGNOSIS — D631 Anemia in chronic kidney disease: Secondary | ICD-10-CM | POA: Diagnosis not present

## 2019-12-07 DIAGNOSIS — L03115 Cellulitis of right lower limb: Secondary | ICD-10-CM | POA: Diagnosis not present

## 2019-12-07 DIAGNOSIS — Z992 Dependence on renal dialysis: Secondary | ICD-10-CM | POA: Diagnosis not present

## 2019-12-09 DIAGNOSIS — L03115 Cellulitis of right lower limb: Secondary | ICD-10-CM | POA: Diagnosis not present

## 2019-12-09 DIAGNOSIS — D631 Anemia in chronic kidney disease: Secondary | ICD-10-CM | POA: Diagnosis not present

## 2019-12-09 DIAGNOSIS — Z4781 Encounter for orthopedic aftercare following surgical amputation: Secondary | ICD-10-CM | POA: Diagnosis not present

## 2019-12-09 DIAGNOSIS — Z992 Dependence on renal dialysis: Secondary | ICD-10-CM | POA: Diagnosis not present

## 2019-12-09 DIAGNOSIS — N2581 Secondary hyperparathyroidism of renal origin: Secondary | ICD-10-CM | POA: Diagnosis not present

## 2019-12-09 DIAGNOSIS — N186 End stage renal disease: Secondary | ICD-10-CM | POA: Diagnosis not present

## 2019-12-09 DIAGNOSIS — E1151 Type 2 diabetes mellitus with diabetic peripheral angiopathy without gangrene: Secondary | ICD-10-CM | POA: Diagnosis not present

## 2019-12-09 DIAGNOSIS — D509 Iron deficiency anemia, unspecified: Secondary | ICD-10-CM | POA: Diagnosis not present

## 2019-12-09 DIAGNOSIS — E1122 Type 2 diabetes mellitus with diabetic chronic kidney disease: Secondary | ICD-10-CM | POA: Diagnosis not present

## 2019-12-09 DIAGNOSIS — I120 Hypertensive chronic kidney disease with stage 5 chronic kidney disease or end stage renal disease: Secondary | ICD-10-CM | POA: Diagnosis not present

## 2019-12-10 DIAGNOSIS — Z89421 Acquired absence of other right toe(s): Secondary | ICD-10-CM | POA: Diagnosis not present

## 2019-12-10 DIAGNOSIS — Z992 Dependence on renal dialysis: Secondary | ICD-10-CM | POA: Diagnosis not present

## 2019-12-10 DIAGNOSIS — L03119 Cellulitis of unspecified part of limb: Secondary | ICD-10-CM | POA: Diagnosis not present

## 2019-12-10 DIAGNOSIS — Z885 Allergy status to narcotic agent status: Secondary | ICD-10-CM | POA: Diagnosis not present

## 2019-12-10 DIAGNOSIS — N250 Renal osteodystrophy: Secondary | ICD-10-CM | POA: Diagnosis not present

## 2019-12-10 DIAGNOSIS — Z20822 Contact with and (suspected) exposure to covid-19: Secondary | ICD-10-CM | POA: Diagnosis not present

## 2019-12-10 DIAGNOSIS — I120 Hypertensive chronic kidney disease with stage 5 chronic kidney disease or end stage renal disease: Secondary | ICD-10-CM | POA: Diagnosis not present

## 2019-12-10 DIAGNOSIS — Z89431 Acquired absence of right foot: Secondary | ICD-10-CM | POA: Diagnosis not present

## 2019-12-10 DIAGNOSIS — N186 End stage renal disease: Secondary | ICD-10-CM | POA: Diagnosis not present

## 2019-12-10 DIAGNOSIS — E785 Hyperlipidemia, unspecified: Secondary | ICD-10-CM | POA: Diagnosis not present

## 2019-12-10 DIAGNOSIS — A419 Sepsis, unspecified organism: Secondary | ICD-10-CM | POA: Diagnosis not present

## 2019-12-10 DIAGNOSIS — L299 Pruritus, unspecified: Secondary | ICD-10-CM | POA: Diagnosis not present

## 2019-12-10 DIAGNOSIS — Z881 Allergy status to other antibiotic agents status: Secondary | ICD-10-CM | POA: Diagnosis not present

## 2019-12-10 DIAGNOSIS — E1122 Type 2 diabetes mellitus with diabetic chronic kidney disease: Secondary | ICD-10-CM | POA: Diagnosis not present

## 2019-12-10 DIAGNOSIS — N189 Chronic kidney disease, unspecified: Secondary | ICD-10-CM | POA: Diagnosis not present

## 2019-12-10 DIAGNOSIS — L02419 Cutaneous abscess of limb, unspecified: Secondary | ICD-10-CM | POA: Diagnosis not present

## 2019-12-10 DIAGNOSIS — E1151 Type 2 diabetes mellitus with diabetic peripheral angiopathy without gangrene: Secondary | ICD-10-CM | POA: Diagnosis not present

## 2019-12-10 DIAGNOSIS — L03115 Cellulitis of right lower limb: Secondary | ICD-10-CM | POA: Diagnosis not present

## 2019-12-10 DIAGNOSIS — Z7902 Long term (current) use of antithrombotics/antiplatelets: Secondary | ICD-10-CM | POA: Diagnosis not present

## 2019-12-10 DIAGNOSIS — Z4781 Encounter for orthopedic aftercare following surgical amputation: Secondary | ICD-10-CM | POA: Diagnosis not present

## 2019-12-10 DIAGNOSIS — D631 Anemia in chronic kidney disease: Secondary | ICD-10-CM | POA: Diagnosis not present

## 2019-12-10 DIAGNOSIS — R600 Localized edema: Secondary | ICD-10-CM | POA: Diagnosis not present

## 2019-12-10 DIAGNOSIS — I739 Peripheral vascular disease, unspecified: Secondary | ICD-10-CM | POA: Diagnosis not present

## 2019-12-10 DIAGNOSIS — M726 Necrotizing fasciitis: Secondary | ICD-10-CM | POA: Diagnosis not present

## 2019-12-12 DIAGNOSIS — N186 End stage renal disease: Secondary | ICD-10-CM | POA: Diagnosis not present

## 2019-12-12 DIAGNOSIS — E1122 Type 2 diabetes mellitus with diabetic chronic kidney disease: Secondary | ICD-10-CM | POA: Diagnosis not present

## 2019-12-12 DIAGNOSIS — Z89421 Acquired absence of other right toe(s): Secondary | ICD-10-CM | POA: Diagnosis not present

## 2019-12-12 DIAGNOSIS — E1151 Type 2 diabetes mellitus with diabetic peripheral angiopathy without gangrene: Secondary | ICD-10-CM | POA: Diagnosis not present

## 2019-12-12 DIAGNOSIS — Z992 Dependence on renal dialysis: Secondary | ICD-10-CM | POA: Diagnosis not present

## 2019-12-12 DIAGNOSIS — Z4781 Encounter for orthopedic aftercare following surgical amputation: Secondary | ICD-10-CM | POA: Diagnosis not present

## 2019-12-12 DIAGNOSIS — I120 Hypertensive chronic kidney disease with stage 5 chronic kidney disease or end stage renal disease: Secondary | ICD-10-CM | POA: Diagnosis not present

## 2019-12-12 DIAGNOSIS — Z7902 Long term (current) use of antithrombotics/antiplatelets: Secondary | ICD-10-CM | POA: Diagnosis not present

## 2019-12-19 DIAGNOSIS — N186 End stage renal disease: Secondary | ICD-10-CM | POA: Diagnosis not present

## 2019-12-19 DIAGNOSIS — E1129 Type 2 diabetes mellitus with other diabetic kidney complication: Secondary | ICD-10-CM | POA: Diagnosis not present

## 2019-12-19 DIAGNOSIS — Z992 Dependence on renal dialysis: Secondary | ICD-10-CM | POA: Diagnosis not present

## 2019-12-19 DIAGNOSIS — D631 Anemia in chronic kidney disease: Secondary | ICD-10-CM | POA: Diagnosis not present

## 2019-12-19 DIAGNOSIS — L03115 Cellulitis of right lower limb: Secondary | ICD-10-CM | POA: Diagnosis not present

## 2019-12-19 DIAGNOSIS — D509 Iron deficiency anemia, unspecified: Secondary | ICD-10-CM | POA: Diagnosis not present

## 2019-12-19 DIAGNOSIS — N2581 Secondary hyperparathyroidism of renal origin: Secondary | ICD-10-CM | POA: Diagnosis not present

## 2019-12-20 DIAGNOSIS — N186 End stage renal disease: Secondary | ICD-10-CM | POA: Diagnosis not present

## 2019-12-20 DIAGNOSIS — H6123 Impacted cerumen, bilateral: Secondary | ICD-10-CM | POA: Diagnosis not present

## 2019-12-20 DIAGNOSIS — Z992 Dependence on renal dialysis: Secondary | ICD-10-CM | POA: Diagnosis not present

## 2019-12-20 DIAGNOSIS — I10 Essential (primary) hypertension: Secondary | ICD-10-CM | POA: Diagnosis not present

## 2019-12-21 DIAGNOSIS — N2581 Secondary hyperparathyroidism of renal origin: Secondary | ICD-10-CM | POA: Diagnosis not present

## 2019-12-21 DIAGNOSIS — L03115 Cellulitis of right lower limb: Secondary | ICD-10-CM | POA: Diagnosis not present

## 2019-12-21 DIAGNOSIS — D631 Anemia in chronic kidney disease: Secondary | ICD-10-CM | POA: Diagnosis not present

## 2019-12-21 DIAGNOSIS — D509 Iron deficiency anemia, unspecified: Secondary | ICD-10-CM | POA: Diagnosis not present

## 2019-12-21 DIAGNOSIS — N186 End stage renal disease: Secondary | ICD-10-CM | POA: Diagnosis not present

## 2019-12-21 DIAGNOSIS — Z992 Dependence on renal dialysis: Secondary | ICD-10-CM | POA: Diagnosis not present

## 2019-12-23 DIAGNOSIS — Z4781 Encounter for orthopedic aftercare following surgical amputation: Secondary | ICD-10-CM | POA: Diagnosis not present

## 2019-12-23 DIAGNOSIS — N2581 Secondary hyperparathyroidism of renal origin: Secondary | ICD-10-CM | POA: Diagnosis not present

## 2019-12-23 DIAGNOSIS — E1122 Type 2 diabetes mellitus with diabetic chronic kidney disease: Secondary | ICD-10-CM | POA: Diagnosis not present

## 2019-12-23 DIAGNOSIS — D631 Anemia in chronic kidney disease: Secondary | ICD-10-CM | POA: Diagnosis not present

## 2019-12-23 DIAGNOSIS — E1151 Type 2 diabetes mellitus with diabetic peripheral angiopathy without gangrene: Secondary | ICD-10-CM | POA: Diagnosis not present

## 2019-12-23 DIAGNOSIS — Z992 Dependence on renal dialysis: Secondary | ICD-10-CM | POA: Diagnosis not present

## 2019-12-23 DIAGNOSIS — I120 Hypertensive chronic kidney disease with stage 5 chronic kidney disease or end stage renal disease: Secondary | ICD-10-CM | POA: Diagnosis not present

## 2019-12-23 DIAGNOSIS — D509 Iron deficiency anemia, unspecified: Secondary | ICD-10-CM | POA: Diagnosis not present

## 2019-12-23 DIAGNOSIS — L03115 Cellulitis of right lower limb: Secondary | ICD-10-CM | POA: Diagnosis not present

## 2019-12-23 DIAGNOSIS — N186 End stage renal disease: Secondary | ICD-10-CM | POA: Diagnosis not present

## 2019-12-26 DIAGNOSIS — D631 Anemia in chronic kidney disease: Secondary | ICD-10-CM | POA: Diagnosis not present

## 2019-12-26 DIAGNOSIS — D509 Iron deficiency anemia, unspecified: Secondary | ICD-10-CM | POA: Diagnosis not present

## 2019-12-26 DIAGNOSIS — N186 End stage renal disease: Secondary | ICD-10-CM | POA: Diagnosis not present

## 2019-12-26 DIAGNOSIS — Z992 Dependence on renal dialysis: Secondary | ICD-10-CM | POA: Diagnosis not present

## 2019-12-26 DIAGNOSIS — L03115 Cellulitis of right lower limb: Secondary | ICD-10-CM | POA: Diagnosis not present

## 2019-12-26 DIAGNOSIS — N2581 Secondary hyperparathyroidism of renal origin: Secondary | ICD-10-CM | POA: Diagnosis not present

## 2019-12-28 DIAGNOSIS — Z992 Dependence on renal dialysis: Secondary | ICD-10-CM | POA: Diagnosis not present

## 2019-12-28 DIAGNOSIS — L03115 Cellulitis of right lower limb: Secondary | ICD-10-CM | POA: Diagnosis not present

## 2019-12-28 DIAGNOSIS — D509 Iron deficiency anemia, unspecified: Secondary | ICD-10-CM | POA: Diagnosis not present

## 2019-12-28 DIAGNOSIS — N186 End stage renal disease: Secondary | ICD-10-CM | POA: Diagnosis not present

## 2019-12-28 DIAGNOSIS — N2581 Secondary hyperparathyroidism of renal origin: Secondary | ICD-10-CM | POA: Diagnosis not present

## 2019-12-28 DIAGNOSIS — D631 Anemia in chronic kidney disease: Secondary | ICD-10-CM | POA: Diagnosis not present

## 2019-12-29 DIAGNOSIS — Z992 Dependence on renal dialysis: Secondary | ICD-10-CM | POA: Diagnosis not present

## 2019-12-29 DIAGNOSIS — L97512 Non-pressure chronic ulcer of other part of right foot with fat layer exposed: Secondary | ICD-10-CM | POA: Diagnosis not present

## 2019-12-29 DIAGNOSIS — N186 End stage renal disease: Secondary | ICD-10-CM | POA: Diagnosis not present

## 2019-12-29 DIAGNOSIS — E11621 Type 2 diabetes mellitus with foot ulcer: Secondary | ICD-10-CM | POA: Diagnosis not present

## 2019-12-29 DIAGNOSIS — I739 Peripheral vascular disease, unspecified: Secondary | ICD-10-CM | POA: Diagnosis not present

## 2019-12-29 DIAGNOSIS — L97509 Non-pressure chronic ulcer of other part of unspecified foot with unspecified severity: Secondary | ICD-10-CM | POA: Diagnosis not present

## 2019-12-29 DIAGNOSIS — I890 Lymphedema, not elsewhere classified: Secondary | ICD-10-CM | POA: Diagnosis not present

## 2019-12-29 DIAGNOSIS — Z89431 Acquired absence of right foot: Secondary | ICD-10-CM | POA: Diagnosis not present

## 2019-12-30 DIAGNOSIS — D509 Iron deficiency anemia, unspecified: Secondary | ICD-10-CM | POA: Diagnosis not present

## 2019-12-30 DIAGNOSIS — N2581 Secondary hyperparathyroidism of renal origin: Secondary | ICD-10-CM | POA: Diagnosis not present

## 2019-12-30 DIAGNOSIS — D631 Anemia in chronic kidney disease: Secondary | ICD-10-CM | POA: Diagnosis not present

## 2019-12-30 DIAGNOSIS — L03115 Cellulitis of right lower limb: Secondary | ICD-10-CM | POA: Diagnosis not present

## 2019-12-30 DIAGNOSIS — Z992 Dependence on renal dialysis: Secondary | ICD-10-CM | POA: Diagnosis not present

## 2019-12-30 DIAGNOSIS — N186 End stage renal disease: Secondary | ICD-10-CM | POA: Diagnosis not present

## 2020-01-02 DIAGNOSIS — Z992 Dependence on renal dialysis: Secondary | ICD-10-CM | POA: Diagnosis not present

## 2020-01-02 DIAGNOSIS — N2581 Secondary hyperparathyroidism of renal origin: Secondary | ICD-10-CM | POA: Diagnosis not present

## 2020-01-02 DIAGNOSIS — D509 Iron deficiency anemia, unspecified: Secondary | ICD-10-CM | POA: Diagnosis not present

## 2020-01-02 DIAGNOSIS — L03115 Cellulitis of right lower limb: Secondary | ICD-10-CM | POA: Diagnosis not present

## 2020-01-02 DIAGNOSIS — N186 End stage renal disease: Secondary | ICD-10-CM | POA: Diagnosis not present

## 2020-01-02 DIAGNOSIS — D631 Anemia in chronic kidney disease: Secondary | ICD-10-CM | POA: Diagnosis not present

## 2020-01-03 DIAGNOSIS — M79661 Pain in right lower leg: Secondary | ICD-10-CM | POA: Diagnosis not present

## 2020-01-03 DIAGNOSIS — R2689 Other abnormalities of gait and mobility: Secondary | ICD-10-CM | POA: Diagnosis not present

## 2020-01-03 DIAGNOSIS — M25471 Effusion, right ankle: Secondary | ICD-10-CM | POA: Diagnosis not present

## 2020-01-03 DIAGNOSIS — R531 Weakness: Secondary | ICD-10-CM | POA: Diagnosis not present

## 2020-01-04 DIAGNOSIS — N186 End stage renal disease: Secondary | ICD-10-CM | POA: Diagnosis not present

## 2020-01-04 DIAGNOSIS — Z992 Dependence on renal dialysis: Secondary | ICD-10-CM | POA: Diagnosis not present

## 2020-01-04 DIAGNOSIS — D509 Iron deficiency anemia, unspecified: Secondary | ICD-10-CM | POA: Diagnosis not present

## 2020-01-04 DIAGNOSIS — N2581 Secondary hyperparathyroidism of renal origin: Secondary | ICD-10-CM | POA: Diagnosis not present

## 2020-01-04 DIAGNOSIS — D631 Anemia in chronic kidney disease: Secondary | ICD-10-CM | POA: Diagnosis not present

## 2020-01-04 DIAGNOSIS — L03115 Cellulitis of right lower limb: Secondary | ICD-10-CM | POA: Diagnosis not present

## 2020-01-06 DIAGNOSIS — E1122 Type 2 diabetes mellitus with diabetic chronic kidney disease: Secondary | ICD-10-CM | POA: Diagnosis not present

## 2020-01-06 DIAGNOSIS — Z4781 Encounter for orthopedic aftercare following surgical amputation: Secondary | ICD-10-CM | POA: Diagnosis not present

## 2020-01-06 DIAGNOSIS — I120 Hypertensive chronic kidney disease with stage 5 chronic kidney disease or end stage renal disease: Secondary | ICD-10-CM | POA: Diagnosis not present

## 2020-01-06 DIAGNOSIS — D509 Iron deficiency anemia, unspecified: Secondary | ICD-10-CM | POA: Diagnosis not present

## 2020-01-06 DIAGNOSIS — Z992 Dependence on renal dialysis: Secondary | ICD-10-CM | POA: Diagnosis not present

## 2020-01-06 DIAGNOSIS — E1151 Type 2 diabetes mellitus with diabetic peripheral angiopathy without gangrene: Secondary | ICD-10-CM | POA: Diagnosis not present

## 2020-01-06 DIAGNOSIS — D631 Anemia in chronic kidney disease: Secondary | ICD-10-CM | POA: Diagnosis not present

## 2020-01-06 DIAGNOSIS — N2581 Secondary hyperparathyroidism of renal origin: Secondary | ICD-10-CM | POA: Diagnosis not present

## 2020-01-06 DIAGNOSIS — L03115 Cellulitis of right lower limb: Secondary | ICD-10-CM | POA: Diagnosis not present

## 2020-01-06 DIAGNOSIS — N186 End stage renal disease: Secondary | ICD-10-CM | POA: Diagnosis not present

## 2020-01-09 DIAGNOSIS — E1122 Type 2 diabetes mellitus with diabetic chronic kidney disease: Secondary | ICD-10-CM | POA: Diagnosis not present

## 2020-01-09 DIAGNOSIS — N186 End stage renal disease: Secondary | ICD-10-CM | POA: Diagnosis not present

## 2020-01-09 DIAGNOSIS — I120 Hypertensive chronic kidney disease with stage 5 chronic kidney disease or end stage renal disease: Secondary | ICD-10-CM | POA: Diagnosis not present

## 2020-01-09 DIAGNOSIS — D631 Anemia in chronic kidney disease: Secondary | ICD-10-CM | POA: Diagnosis not present

## 2020-01-09 DIAGNOSIS — E1151 Type 2 diabetes mellitus with diabetic peripheral angiopathy without gangrene: Secondary | ICD-10-CM | POA: Diagnosis not present

## 2020-01-09 DIAGNOSIS — Z4781 Encounter for orthopedic aftercare following surgical amputation: Secondary | ICD-10-CM | POA: Diagnosis not present

## 2020-01-09 DIAGNOSIS — D509 Iron deficiency anemia, unspecified: Secondary | ICD-10-CM | POA: Diagnosis not present

## 2020-01-09 DIAGNOSIS — L03115 Cellulitis of right lower limb: Secondary | ICD-10-CM | POA: Diagnosis not present

## 2020-01-09 DIAGNOSIS — N2581 Secondary hyperparathyroidism of renal origin: Secondary | ICD-10-CM | POA: Diagnosis not present

## 2020-01-09 DIAGNOSIS — Z992 Dependence on renal dialysis: Secondary | ICD-10-CM | POA: Diagnosis not present

## 2020-01-11 DIAGNOSIS — Z992 Dependence on renal dialysis: Secondary | ICD-10-CM | POA: Diagnosis not present

## 2020-01-11 DIAGNOSIS — Z7982 Long term (current) use of aspirin: Secondary | ICD-10-CM | POA: Diagnosis not present

## 2020-01-11 DIAGNOSIS — L03115 Cellulitis of right lower limb: Secondary | ICD-10-CM | POA: Diagnosis not present

## 2020-01-11 DIAGNOSIS — Z89421 Acquired absence of other right toe(s): Secondary | ICD-10-CM | POA: Diagnosis not present

## 2020-01-11 DIAGNOSIS — D509 Iron deficiency anemia, unspecified: Secondary | ICD-10-CM | POA: Diagnosis not present

## 2020-01-11 DIAGNOSIS — N186 End stage renal disease: Secondary | ICD-10-CM | POA: Diagnosis not present

## 2020-01-11 DIAGNOSIS — N2581 Secondary hyperparathyroidism of renal origin: Secondary | ICD-10-CM | POA: Diagnosis not present

## 2020-01-11 DIAGNOSIS — D631 Anemia in chronic kidney disease: Secondary | ICD-10-CM | POA: Diagnosis not present

## 2020-01-11 DIAGNOSIS — I120 Hypertensive chronic kidney disease with stage 5 chronic kidney disease or end stage renal disease: Secondary | ICD-10-CM | POA: Diagnosis not present

## 2020-01-11 DIAGNOSIS — Z4781 Encounter for orthopedic aftercare following surgical amputation: Secondary | ICD-10-CM | POA: Diagnosis not present

## 2020-01-11 DIAGNOSIS — Z7902 Long term (current) use of antithrombotics/antiplatelets: Secondary | ICD-10-CM | POA: Diagnosis not present

## 2020-01-11 DIAGNOSIS — E1151 Type 2 diabetes mellitus with diabetic peripheral angiopathy without gangrene: Secondary | ICD-10-CM | POA: Diagnosis not present

## 2020-01-11 DIAGNOSIS — E1122 Type 2 diabetes mellitus with diabetic chronic kidney disease: Secondary | ICD-10-CM | POA: Diagnosis not present

## 2020-01-12 DIAGNOSIS — L97512 Non-pressure chronic ulcer of other part of right foot with fat layer exposed: Secondary | ICD-10-CM | POA: Diagnosis not present

## 2020-01-12 DIAGNOSIS — E11621 Type 2 diabetes mellitus with foot ulcer: Secondary | ICD-10-CM | POA: Diagnosis not present

## 2020-01-13 DIAGNOSIS — N186 End stage renal disease: Secondary | ICD-10-CM | POA: Diagnosis not present

## 2020-01-13 DIAGNOSIS — L03115 Cellulitis of right lower limb: Secondary | ICD-10-CM | POA: Diagnosis not present

## 2020-01-13 DIAGNOSIS — D509 Iron deficiency anemia, unspecified: Secondary | ICD-10-CM | POA: Diagnosis not present

## 2020-01-13 DIAGNOSIS — D631 Anemia in chronic kidney disease: Secondary | ICD-10-CM | POA: Diagnosis not present

## 2020-01-13 DIAGNOSIS — N2581 Secondary hyperparathyroidism of renal origin: Secondary | ICD-10-CM | POA: Diagnosis not present

## 2020-01-13 DIAGNOSIS — I120 Hypertensive chronic kidney disease with stage 5 chronic kidney disease or end stage renal disease: Secondary | ICD-10-CM | POA: Diagnosis not present

## 2020-01-13 DIAGNOSIS — E1151 Type 2 diabetes mellitus with diabetic peripheral angiopathy without gangrene: Secondary | ICD-10-CM | POA: Diagnosis not present

## 2020-01-13 DIAGNOSIS — E1122 Type 2 diabetes mellitus with diabetic chronic kidney disease: Secondary | ICD-10-CM | POA: Diagnosis not present

## 2020-01-13 DIAGNOSIS — Z4781 Encounter for orthopedic aftercare following surgical amputation: Secondary | ICD-10-CM | POA: Diagnosis not present

## 2020-01-13 DIAGNOSIS — Z992 Dependence on renal dialysis: Secondary | ICD-10-CM | POA: Diagnosis not present

## 2020-01-16 DIAGNOSIS — L03115 Cellulitis of right lower limb: Secondary | ICD-10-CM | POA: Diagnosis not present

## 2020-01-16 DIAGNOSIS — Z992 Dependence on renal dialysis: Secondary | ICD-10-CM | POA: Diagnosis not present

## 2020-01-16 DIAGNOSIS — D631 Anemia in chronic kidney disease: Secondary | ICD-10-CM | POA: Diagnosis not present

## 2020-01-16 DIAGNOSIS — N2581 Secondary hyperparathyroidism of renal origin: Secondary | ICD-10-CM | POA: Diagnosis not present

## 2020-01-16 DIAGNOSIS — N186 End stage renal disease: Secondary | ICD-10-CM | POA: Diagnosis not present

## 2020-01-16 DIAGNOSIS — D509 Iron deficiency anemia, unspecified: Secondary | ICD-10-CM | POA: Diagnosis not present

## 2020-01-18 DIAGNOSIS — D631 Anemia in chronic kidney disease: Secondary | ICD-10-CM | POA: Diagnosis not present

## 2020-01-18 DIAGNOSIS — L03115 Cellulitis of right lower limb: Secondary | ICD-10-CM | POA: Diagnosis not present

## 2020-01-18 DIAGNOSIS — Z992 Dependence on renal dialysis: Secondary | ICD-10-CM | POA: Diagnosis not present

## 2020-01-18 DIAGNOSIS — D509 Iron deficiency anemia, unspecified: Secondary | ICD-10-CM | POA: Diagnosis not present

## 2020-01-18 DIAGNOSIS — N2581 Secondary hyperparathyroidism of renal origin: Secondary | ICD-10-CM | POA: Diagnosis not present

## 2020-01-18 DIAGNOSIS — N186 End stage renal disease: Secondary | ICD-10-CM | POA: Diagnosis not present

## 2020-01-19 DIAGNOSIS — N186 End stage renal disease: Secondary | ICD-10-CM | POA: Diagnosis not present

## 2020-01-19 DIAGNOSIS — Z992 Dependence on renal dialysis: Secondary | ICD-10-CM | POA: Diagnosis not present

## 2020-01-19 DIAGNOSIS — E1129 Type 2 diabetes mellitus with other diabetic kidney complication: Secondary | ICD-10-CM | POA: Diagnosis not present

## 2020-01-19 DIAGNOSIS — D631 Anemia in chronic kidney disease: Secondary | ICD-10-CM | POA: Diagnosis not present

## 2020-01-19 DIAGNOSIS — N2581 Secondary hyperparathyroidism of renal origin: Secondary | ICD-10-CM | POA: Diagnosis not present

## 2020-01-23 DIAGNOSIS — N2581 Secondary hyperparathyroidism of renal origin: Secondary | ICD-10-CM | POA: Diagnosis not present

## 2020-01-23 DIAGNOSIS — Z992 Dependence on renal dialysis: Secondary | ICD-10-CM | POA: Diagnosis not present

## 2020-01-23 DIAGNOSIS — N186 End stage renal disease: Secondary | ICD-10-CM | POA: Diagnosis not present

## 2020-01-23 DIAGNOSIS — D631 Anemia in chronic kidney disease: Secondary | ICD-10-CM | POA: Diagnosis not present

## 2020-01-25 DIAGNOSIS — N186 End stage renal disease: Secondary | ICD-10-CM | POA: Diagnosis not present

## 2020-01-25 DIAGNOSIS — D631 Anemia in chronic kidney disease: Secondary | ICD-10-CM | POA: Diagnosis not present

## 2020-01-25 DIAGNOSIS — N2581 Secondary hyperparathyroidism of renal origin: Secondary | ICD-10-CM | POA: Diagnosis not present

## 2020-01-25 DIAGNOSIS — E1129 Type 2 diabetes mellitus with other diabetic kidney complication: Secondary | ICD-10-CM | POA: Diagnosis not present

## 2020-01-25 DIAGNOSIS — Z992 Dependence on renal dialysis: Secondary | ICD-10-CM | POA: Diagnosis not present

## 2020-01-26 DIAGNOSIS — E1159 Type 2 diabetes mellitus with other circulatory complications: Secondary | ICD-10-CM | POA: Diagnosis not present

## 2020-01-26 DIAGNOSIS — I739 Peripheral vascular disease, unspecified: Secondary | ICD-10-CM | POA: Diagnosis not present

## 2020-01-26 DIAGNOSIS — E11621 Type 2 diabetes mellitus with foot ulcer: Secondary | ICD-10-CM | POA: Diagnosis not present

## 2020-01-26 DIAGNOSIS — Z89431 Acquired absence of right foot: Secondary | ICD-10-CM | POA: Diagnosis not present

## 2020-01-26 DIAGNOSIS — L97521 Non-pressure chronic ulcer of other part of left foot limited to breakdown of skin: Secondary | ICD-10-CM | POA: Diagnosis not present

## 2020-01-26 DIAGNOSIS — L97512 Non-pressure chronic ulcer of other part of right foot with fat layer exposed: Secondary | ICD-10-CM | POA: Diagnosis not present

## 2020-01-27 DIAGNOSIS — N2581 Secondary hyperparathyroidism of renal origin: Secondary | ICD-10-CM | POA: Diagnosis not present

## 2020-01-27 DIAGNOSIS — Z992 Dependence on renal dialysis: Secondary | ICD-10-CM | POA: Diagnosis not present

## 2020-01-27 DIAGNOSIS — N186 End stage renal disease: Secondary | ICD-10-CM | POA: Diagnosis not present

## 2020-01-27 DIAGNOSIS — D631 Anemia in chronic kidney disease: Secondary | ICD-10-CM | POA: Diagnosis not present

## 2020-01-30 DIAGNOSIS — D631 Anemia in chronic kidney disease: Secondary | ICD-10-CM | POA: Diagnosis not present

## 2020-01-30 DIAGNOSIS — N2581 Secondary hyperparathyroidism of renal origin: Secondary | ICD-10-CM | POA: Diagnosis not present

## 2020-01-30 DIAGNOSIS — N186 End stage renal disease: Secondary | ICD-10-CM | POA: Diagnosis not present

## 2020-01-30 DIAGNOSIS — Z992 Dependence on renal dialysis: Secondary | ICD-10-CM | POA: Diagnosis not present

## 2020-02-01 DIAGNOSIS — D631 Anemia in chronic kidney disease: Secondary | ICD-10-CM | POA: Diagnosis not present

## 2020-02-01 DIAGNOSIS — N186 End stage renal disease: Secondary | ICD-10-CM | POA: Diagnosis not present

## 2020-02-01 DIAGNOSIS — Z992 Dependence on renal dialysis: Secondary | ICD-10-CM | POA: Diagnosis not present

## 2020-02-01 DIAGNOSIS — N2581 Secondary hyperparathyroidism of renal origin: Secondary | ICD-10-CM | POA: Diagnosis not present

## 2020-02-02 DIAGNOSIS — D631 Anemia in chronic kidney disease: Secondary | ICD-10-CM | POA: Diagnosis not present

## 2020-02-02 DIAGNOSIS — N2581 Secondary hyperparathyroidism of renal origin: Secondary | ICD-10-CM | POA: Diagnosis not present

## 2020-02-02 DIAGNOSIS — N186 End stage renal disease: Secondary | ICD-10-CM | POA: Diagnosis not present

## 2020-02-02 DIAGNOSIS — Z992 Dependence on renal dialysis: Secondary | ICD-10-CM | POA: Diagnosis not present

## 2020-02-06 DIAGNOSIS — Z992 Dependence on renal dialysis: Secondary | ICD-10-CM | POA: Diagnosis not present

## 2020-02-06 DIAGNOSIS — N186 End stage renal disease: Secondary | ICD-10-CM | POA: Diagnosis not present

## 2020-02-06 DIAGNOSIS — N2581 Secondary hyperparathyroidism of renal origin: Secondary | ICD-10-CM | POA: Diagnosis not present

## 2020-02-06 DIAGNOSIS — D631 Anemia in chronic kidney disease: Secondary | ICD-10-CM | POA: Diagnosis not present

## 2020-02-08 DIAGNOSIS — D631 Anemia in chronic kidney disease: Secondary | ICD-10-CM | POA: Diagnosis not present

## 2020-02-08 DIAGNOSIS — N2581 Secondary hyperparathyroidism of renal origin: Secondary | ICD-10-CM | POA: Diagnosis not present

## 2020-02-08 DIAGNOSIS — N186 End stage renal disease: Secondary | ICD-10-CM | POA: Diagnosis not present

## 2020-02-08 DIAGNOSIS — Z992 Dependence on renal dialysis: Secondary | ICD-10-CM | POA: Diagnosis not present

## 2020-02-09 DIAGNOSIS — Z992 Dependence on renal dialysis: Secondary | ICD-10-CM | POA: Diagnosis not present

## 2020-02-09 DIAGNOSIS — E11621 Type 2 diabetes mellitus with foot ulcer: Secondary | ICD-10-CM | POA: Diagnosis not present

## 2020-02-09 DIAGNOSIS — L97512 Non-pressure chronic ulcer of other part of right foot with fat layer exposed: Secondary | ICD-10-CM | POA: Diagnosis not present

## 2020-02-09 DIAGNOSIS — D631 Anemia in chronic kidney disease: Secondary | ICD-10-CM | POA: Diagnosis not present

## 2020-02-09 DIAGNOSIS — N186 End stage renal disease: Secondary | ICD-10-CM | POA: Diagnosis not present

## 2020-02-09 DIAGNOSIS — N2581 Secondary hyperparathyroidism of renal origin: Secondary | ICD-10-CM | POA: Diagnosis not present

## 2020-02-10 DIAGNOSIS — Z4781 Encounter for orthopedic aftercare following surgical amputation: Secondary | ICD-10-CM | POA: Diagnosis not present

## 2020-02-13 DIAGNOSIS — D631 Anemia in chronic kidney disease: Secondary | ICD-10-CM | POA: Diagnosis not present

## 2020-02-13 DIAGNOSIS — N186 End stage renal disease: Secondary | ICD-10-CM | POA: Diagnosis not present

## 2020-02-13 DIAGNOSIS — Z992 Dependence on renal dialysis: Secondary | ICD-10-CM | POA: Diagnosis not present

## 2020-02-13 DIAGNOSIS — N2581 Secondary hyperparathyroidism of renal origin: Secondary | ICD-10-CM | POA: Diagnosis not present

## 2020-02-15 DIAGNOSIS — N186 End stage renal disease: Secondary | ICD-10-CM | POA: Diagnosis not present

## 2020-02-15 DIAGNOSIS — D631 Anemia in chronic kidney disease: Secondary | ICD-10-CM | POA: Diagnosis not present

## 2020-02-15 DIAGNOSIS — Z992 Dependence on renal dialysis: Secondary | ICD-10-CM | POA: Diagnosis not present

## 2020-02-15 DIAGNOSIS — N2581 Secondary hyperparathyroidism of renal origin: Secondary | ICD-10-CM | POA: Diagnosis not present

## 2020-02-17 DIAGNOSIS — N2581 Secondary hyperparathyroidism of renal origin: Secondary | ICD-10-CM | POA: Diagnosis not present

## 2020-02-17 DIAGNOSIS — Z992 Dependence on renal dialysis: Secondary | ICD-10-CM | POA: Diagnosis not present

## 2020-02-17 DIAGNOSIS — N186 End stage renal disease: Secondary | ICD-10-CM | POA: Diagnosis not present

## 2020-02-17 DIAGNOSIS — D631 Anemia in chronic kidney disease: Secondary | ICD-10-CM | POA: Diagnosis not present

## 2020-02-18 DIAGNOSIS — Z992 Dependence on renal dialysis: Secondary | ICD-10-CM | POA: Diagnosis not present

## 2020-02-18 DIAGNOSIS — E1129 Type 2 diabetes mellitus with other diabetic kidney complication: Secondary | ICD-10-CM | POA: Diagnosis not present

## 2020-02-18 DIAGNOSIS — N186 End stage renal disease: Secondary | ICD-10-CM | POA: Diagnosis not present

## 2020-02-20 DIAGNOSIS — D631 Anemia in chronic kidney disease: Secondary | ICD-10-CM | POA: Diagnosis not present

## 2020-02-20 DIAGNOSIS — Z992 Dependence on renal dialysis: Secondary | ICD-10-CM | POA: Diagnosis not present

## 2020-02-20 DIAGNOSIS — N186 End stage renal disease: Secondary | ICD-10-CM | POA: Diagnosis not present

## 2020-02-20 DIAGNOSIS — N2581 Secondary hyperparathyroidism of renal origin: Secondary | ICD-10-CM | POA: Diagnosis not present

## 2020-02-22 DIAGNOSIS — D631 Anemia in chronic kidney disease: Secondary | ICD-10-CM | POA: Diagnosis not present

## 2020-02-22 DIAGNOSIS — N186 End stage renal disease: Secondary | ICD-10-CM | POA: Diagnosis not present

## 2020-02-22 DIAGNOSIS — Z992 Dependence on renal dialysis: Secondary | ICD-10-CM | POA: Diagnosis not present

## 2020-02-22 DIAGNOSIS — N2581 Secondary hyperparathyroidism of renal origin: Secondary | ICD-10-CM | POA: Diagnosis not present

## 2020-02-23 DIAGNOSIS — L97521 Non-pressure chronic ulcer of other part of left foot limited to breakdown of skin: Secondary | ICD-10-CM | POA: Diagnosis not present

## 2020-02-23 DIAGNOSIS — L97512 Non-pressure chronic ulcer of other part of right foot with fat layer exposed: Secondary | ICD-10-CM | POA: Diagnosis not present

## 2020-02-24 DIAGNOSIS — N2581 Secondary hyperparathyroidism of renal origin: Secondary | ICD-10-CM | POA: Diagnosis not present

## 2020-02-24 DIAGNOSIS — N186 End stage renal disease: Secondary | ICD-10-CM | POA: Diagnosis not present

## 2020-02-24 DIAGNOSIS — Z992 Dependence on renal dialysis: Secondary | ICD-10-CM | POA: Diagnosis not present

## 2020-02-24 DIAGNOSIS — D631 Anemia in chronic kidney disease: Secondary | ICD-10-CM | POA: Diagnosis not present

## 2020-02-27 DIAGNOSIS — Z992 Dependence on renal dialysis: Secondary | ICD-10-CM | POA: Diagnosis not present

## 2020-02-27 DIAGNOSIS — D631 Anemia in chronic kidney disease: Secondary | ICD-10-CM | POA: Diagnosis not present

## 2020-02-27 DIAGNOSIS — N186 End stage renal disease: Secondary | ICD-10-CM | POA: Diagnosis not present

## 2020-02-27 DIAGNOSIS — N2581 Secondary hyperparathyroidism of renal origin: Secondary | ICD-10-CM | POA: Diagnosis not present

## 2020-02-29 DIAGNOSIS — Z992 Dependence on renal dialysis: Secondary | ICD-10-CM | POA: Diagnosis not present

## 2020-02-29 DIAGNOSIS — D631 Anemia in chronic kidney disease: Secondary | ICD-10-CM | POA: Diagnosis not present

## 2020-02-29 DIAGNOSIS — N186 End stage renal disease: Secondary | ICD-10-CM | POA: Diagnosis not present

## 2020-02-29 DIAGNOSIS — N2581 Secondary hyperparathyroidism of renal origin: Secondary | ICD-10-CM | POA: Diagnosis not present

## 2020-03-02 DIAGNOSIS — N186 End stage renal disease: Secondary | ICD-10-CM | POA: Diagnosis not present

## 2020-03-02 DIAGNOSIS — N2581 Secondary hyperparathyroidism of renal origin: Secondary | ICD-10-CM | POA: Diagnosis not present

## 2020-03-02 DIAGNOSIS — Z992 Dependence on renal dialysis: Secondary | ICD-10-CM | POA: Diagnosis not present

## 2020-03-02 DIAGNOSIS — D631 Anemia in chronic kidney disease: Secondary | ICD-10-CM | POA: Diagnosis not present

## 2020-03-05 DIAGNOSIS — N2581 Secondary hyperparathyroidism of renal origin: Secondary | ICD-10-CM | POA: Diagnosis not present

## 2020-03-05 DIAGNOSIS — Z992 Dependence on renal dialysis: Secondary | ICD-10-CM | POA: Diagnosis not present

## 2020-03-05 DIAGNOSIS — N186 End stage renal disease: Secondary | ICD-10-CM | POA: Diagnosis not present

## 2020-03-05 DIAGNOSIS — D631 Anemia in chronic kidney disease: Secondary | ICD-10-CM | POA: Diagnosis not present

## 2020-03-06 DIAGNOSIS — Z992 Dependence on renal dialysis: Secondary | ICD-10-CM | POA: Diagnosis not present

## 2020-03-06 DIAGNOSIS — N2581 Secondary hyperparathyroidism of renal origin: Secondary | ICD-10-CM | POA: Diagnosis not present

## 2020-03-06 DIAGNOSIS — N186 End stage renal disease: Secondary | ICD-10-CM | POA: Diagnosis not present

## 2020-03-06 DIAGNOSIS — D631 Anemia in chronic kidney disease: Secondary | ICD-10-CM | POA: Diagnosis not present

## 2020-03-09 DIAGNOSIS — N186 End stage renal disease: Secondary | ICD-10-CM | POA: Diagnosis not present

## 2020-03-09 DIAGNOSIS — Z992 Dependence on renal dialysis: Secondary | ICD-10-CM | POA: Diagnosis not present

## 2020-03-09 DIAGNOSIS — N2581 Secondary hyperparathyroidism of renal origin: Secondary | ICD-10-CM | POA: Diagnosis not present

## 2020-03-09 DIAGNOSIS — D631 Anemia in chronic kidney disease: Secondary | ICD-10-CM | POA: Diagnosis not present

## 2020-03-12 DIAGNOSIS — N186 End stage renal disease: Secondary | ICD-10-CM | POA: Diagnosis not present

## 2020-03-12 DIAGNOSIS — Z992 Dependence on renal dialysis: Secondary | ICD-10-CM | POA: Diagnosis not present

## 2020-03-12 DIAGNOSIS — N2581 Secondary hyperparathyroidism of renal origin: Secondary | ICD-10-CM | POA: Diagnosis not present

## 2020-03-12 DIAGNOSIS — D631 Anemia in chronic kidney disease: Secondary | ICD-10-CM | POA: Diagnosis not present

## 2020-03-14 DIAGNOSIS — Z992 Dependence on renal dialysis: Secondary | ICD-10-CM | POA: Diagnosis not present

## 2020-03-14 DIAGNOSIS — D631 Anemia in chronic kidney disease: Secondary | ICD-10-CM | POA: Diagnosis not present

## 2020-03-14 DIAGNOSIS — N186 End stage renal disease: Secondary | ICD-10-CM | POA: Diagnosis not present

## 2020-03-14 DIAGNOSIS — N2581 Secondary hyperparathyroidism of renal origin: Secondary | ICD-10-CM | POA: Diagnosis not present

## 2020-03-16 DIAGNOSIS — N2581 Secondary hyperparathyroidism of renal origin: Secondary | ICD-10-CM | POA: Diagnosis not present

## 2020-03-16 DIAGNOSIS — N186 End stage renal disease: Secondary | ICD-10-CM | POA: Diagnosis not present

## 2020-03-16 DIAGNOSIS — D631 Anemia in chronic kidney disease: Secondary | ICD-10-CM | POA: Diagnosis not present

## 2020-03-16 DIAGNOSIS — Z992 Dependence on renal dialysis: Secondary | ICD-10-CM | POA: Diagnosis not present

## 2020-03-19 DIAGNOSIS — D631 Anemia in chronic kidney disease: Secondary | ICD-10-CM | POA: Diagnosis not present

## 2020-03-19 DIAGNOSIS — N2581 Secondary hyperparathyroidism of renal origin: Secondary | ICD-10-CM | POA: Diagnosis not present

## 2020-03-19 DIAGNOSIS — Z992 Dependence on renal dialysis: Secondary | ICD-10-CM | POA: Diagnosis not present

## 2020-03-19 DIAGNOSIS — N186 End stage renal disease: Secondary | ICD-10-CM | POA: Diagnosis not present

## 2020-03-20 DIAGNOSIS — N186 End stage renal disease: Secondary | ICD-10-CM | POA: Diagnosis not present

## 2020-03-20 DIAGNOSIS — Z992 Dependence on renal dialysis: Secondary | ICD-10-CM | POA: Diagnosis not present

## 2020-03-20 DIAGNOSIS — E1129 Type 2 diabetes mellitus with other diabetic kidney complication: Secondary | ICD-10-CM | POA: Diagnosis not present

## 2020-03-21 DIAGNOSIS — N186 End stage renal disease: Secondary | ICD-10-CM | POA: Diagnosis not present

## 2020-03-21 DIAGNOSIS — Z992 Dependence on renal dialysis: Secondary | ICD-10-CM | POA: Diagnosis not present

## 2020-03-21 DIAGNOSIS — N2581 Secondary hyperparathyroidism of renal origin: Secondary | ICD-10-CM | POA: Diagnosis not present

## 2020-03-21 DIAGNOSIS — D631 Anemia in chronic kidney disease: Secondary | ICD-10-CM | POA: Diagnosis not present

## 2020-03-22 DIAGNOSIS — L97512 Non-pressure chronic ulcer of other part of right foot with fat layer exposed: Secondary | ICD-10-CM | POA: Diagnosis not present

## 2020-04-05 DIAGNOSIS — L97512 Non-pressure chronic ulcer of other part of right foot with fat layer exposed: Secondary | ICD-10-CM | POA: Diagnosis not present

## 2020-04-05 DIAGNOSIS — Z89431 Acquired absence of right foot: Secondary | ICD-10-CM | POA: Diagnosis not present

## 2020-04-05 DIAGNOSIS — L97525 Non-pressure chronic ulcer of other part of left foot with muscle involvement without evidence of necrosis: Secondary | ICD-10-CM | POA: Diagnosis not present

## 2020-04-05 DIAGNOSIS — E1159 Type 2 diabetes mellitus with other circulatory complications: Secondary | ICD-10-CM | POA: Diagnosis not present

## 2020-04-12 DIAGNOSIS — L03119 Cellulitis of unspecified part of limb: Secondary | ICD-10-CM | POA: Diagnosis not present

## 2020-04-12 DIAGNOSIS — L97525 Non-pressure chronic ulcer of other part of left foot with muscle involvement without evidence of necrosis: Secondary | ICD-10-CM | POA: Diagnosis not present

## 2020-04-19 DIAGNOSIS — L97512 Non-pressure chronic ulcer of other part of right foot with fat layer exposed: Secondary | ICD-10-CM | POA: Diagnosis not present

## 2020-04-19 DIAGNOSIS — L97525 Non-pressure chronic ulcer of other part of left foot with muscle involvement without evidence of necrosis: Secondary | ICD-10-CM | POA: Diagnosis not present

## 2020-04-19 DIAGNOSIS — N186 End stage renal disease: Secondary | ICD-10-CM | POA: Diagnosis not present

## 2020-04-19 DIAGNOSIS — E1129 Type 2 diabetes mellitus with other diabetic kidney complication: Secondary | ICD-10-CM | POA: Diagnosis not present

## 2020-04-19 DIAGNOSIS — Z992 Dependence on renal dialysis: Secondary | ICD-10-CM | POA: Diagnosis not present

## 2020-04-19 DIAGNOSIS — Z89431 Acquired absence of right foot: Secondary | ICD-10-CM | POA: Diagnosis not present

## 2020-04-19 DIAGNOSIS — E1159 Type 2 diabetes mellitus with other circulatory complications: Secondary | ICD-10-CM | POA: Diagnosis not present

## 2020-04-20 DIAGNOSIS — Z992 Dependence on renal dialysis: Secondary | ICD-10-CM | POA: Diagnosis not present

## 2020-04-20 DIAGNOSIS — N186 End stage renal disease: Secondary | ICD-10-CM | POA: Diagnosis not present

## 2020-04-20 DIAGNOSIS — N2581 Secondary hyperparathyroidism of renal origin: Secondary | ICD-10-CM | POA: Diagnosis not present

## 2020-04-20 DIAGNOSIS — D631 Anemia in chronic kidney disease: Secondary | ICD-10-CM | POA: Diagnosis not present

## 2020-04-23 DIAGNOSIS — N2581 Secondary hyperparathyroidism of renal origin: Secondary | ICD-10-CM | POA: Diagnosis not present

## 2020-04-23 DIAGNOSIS — N186 End stage renal disease: Secondary | ICD-10-CM | POA: Diagnosis not present

## 2020-04-23 DIAGNOSIS — Z992 Dependence on renal dialysis: Secondary | ICD-10-CM | POA: Diagnosis not present

## 2020-04-23 DIAGNOSIS — D631 Anemia in chronic kidney disease: Secondary | ICD-10-CM | POA: Diagnosis not present

## 2020-04-25 DIAGNOSIS — Z992 Dependence on renal dialysis: Secondary | ICD-10-CM | POA: Diagnosis not present

## 2020-04-25 DIAGNOSIS — N186 End stage renal disease: Secondary | ICD-10-CM | POA: Diagnosis not present

## 2020-04-25 DIAGNOSIS — D631 Anemia in chronic kidney disease: Secondary | ICD-10-CM | POA: Diagnosis not present

## 2020-04-25 DIAGNOSIS — N2581 Secondary hyperparathyroidism of renal origin: Secondary | ICD-10-CM | POA: Diagnosis not present

## 2020-04-25 DIAGNOSIS — E1129 Type 2 diabetes mellitus with other diabetic kidney complication: Secondary | ICD-10-CM | POA: Diagnosis not present

## 2020-04-26 DIAGNOSIS — L97511 Non-pressure chronic ulcer of other part of right foot limited to breakdown of skin: Secondary | ICD-10-CM | POA: Diagnosis not present

## 2020-04-26 DIAGNOSIS — L97525 Non-pressure chronic ulcer of other part of left foot with muscle involvement without evidence of necrosis: Secondary | ICD-10-CM | POA: Diagnosis not present

## 2020-04-27 DIAGNOSIS — N186 End stage renal disease: Secondary | ICD-10-CM | POA: Diagnosis not present

## 2020-04-27 DIAGNOSIS — D631 Anemia in chronic kidney disease: Secondary | ICD-10-CM | POA: Diagnosis not present

## 2020-04-27 DIAGNOSIS — Z992 Dependence on renal dialysis: Secondary | ICD-10-CM | POA: Diagnosis not present

## 2020-04-27 DIAGNOSIS — N2581 Secondary hyperparathyroidism of renal origin: Secondary | ICD-10-CM | POA: Diagnosis not present

## 2020-04-30 DIAGNOSIS — D631 Anemia in chronic kidney disease: Secondary | ICD-10-CM | POA: Diagnosis not present

## 2020-04-30 DIAGNOSIS — Z992 Dependence on renal dialysis: Secondary | ICD-10-CM | POA: Diagnosis not present

## 2020-04-30 DIAGNOSIS — N186 End stage renal disease: Secondary | ICD-10-CM | POA: Diagnosis not present

## 2020-04-30 DIAGNOSIS — N2581 Secondary hyperparathyroidism of renal origin: Secondary | ICD-10-CM | POA: Diagnosis not present

## 2020-05-02 DIAGNOSIS — N186 End stage renal disease: Secondary | ICD-10-CM | POA: Diagnosis not present

## 2020-05-02 DIAGNOSIS — Z992 Dependence on renal dialysis: Secondary | ICD-10-CM | POA: Diagnosis not present

## 2020-05-02 DIAGNOSIS — N2581 Secondary hyperparathyroidism of renal origin: Secondary | ICD-10-CM | POA: Diagnosis not present

## 2020-05-02 DIAGNOSIS — D631 Anemia in chronic kidney disease: Secondary | ICD-10-CM | POA: Diagnosis not present

## 2020-05-03 DIAGNOSIS — L97525 Non-pressure chronic ulcer of other part of left foot with muscle involvement without evidence of necrosis: Secondary | ICD-10-CM | POA: Diagnosis not present

## 2020-05-04 DIAGNOSIS — Z992 Dependence on renal dialysis: Secondary | ICD-10-CM | POA: Diagnosis not present

## 2020-05-04 DIAGNOSIS — D631 Anemia in chronic kidney disease: Secondary | ICD-10-CM | POA: Diagnosis not present

## 2020-05-04 DIAGNOSIS — N186 End stage renal disease: Secondary | ICD-10-CM | POA: Diagnosis not present

## 2020-05-04 DIAGNOSIS — N2581 Secondary hyperparathyroidism of renal origin: Secondary | ICD-10-CM | POA: Diagnosis not present

## 2020-05-07 DIAGNOSIS — Z992 Dependence on renal dialysis: Secondary | ICD-10-CM | POA: Diagnosis not present

## 2020-05-07 DIAGNOSIS — N186 End stage renal disease: Secondary | ICD-10-CM | POA: Diagnosis not present

## 2020-05-07 DIAGNOSIS — N2581 Secondary hyperparathyroidism of renal origin: Secondary | ICD-10-CM | POA: Diagnosis not present

## 2020-05-07 DIAGNOSIS — D631 Anemia in chronic kidney disease: Secondary | ICD-10-CM | POA: Diagnosis not present

## 2020-05-09 DIAGNOSIS — N2581 Secondary hyperparathyroidism of renal origin: Secondary | ICD-10-CM | POA: Diagnosis not present

## 2020-05-09 DIAGNOSIS — Z992 Dependence on renal dialysis: Secondary | ICD-10-CM | POA: Diagnosis not present

## 2020-05-09 DIAGNOSIS — D631 Anemia in chronic kidney disease: Secondary | ICD-10-CM | POA: Diagnosis not present

## 2020-05-09 DIAGNOSIS — N186 End stage renal disease: Secondary | ICD-10-CM | POA: Diagnosis not present

## 2020-05-10 DIAGNOSIS — L97525 Non-pressure chronic ulcer of other part of left foot with muscle involvement without evidence of necrosis: Secondary | ICD-10-CM | POA: Diagnosis not present

## 2020-05-11 DIAGNOSIS — D631 Anemia in chronic kidney disease: Secondary | ICD-10-CM | POA: Diagnosis not present

## 2020-05-11 DIAGNOSIS — N186 End stage renal disease: Secondary | ICD-10-CM | POA: Diagnosis not present

## 2020-05-11 DIAGNOSIS — Z992 Dependence on renal dialysis: Secondary | ICD-10-CM | POA: Diagnosis not present

## 2020-05-11 DIAGNOSIS — N2581 Secondary hyperparathyroidism of renal origin: Secondary | ICD-10-CM | POA: Diagnosis not present

## 2020-05-14 DIAGNOSIS — Z992 Dependence on renal dialysis: Secondary | ICD-10-CM | POA: Diagnosis not present

## 2020-05-14 DIAGNOSIS — N186 End stage renal disease: Secondary | ICD-10-CM | POA: Diagnosis not present

## 2020-05-14 DIAGNOSIS — N2581 Secondary hyperparathyroidism of renal origin: Secondary | ICD-10-CM | POA: Diagnosis not present

## 2020-05-14 DIAGNOSIS — D631 Anemia in chronic kidney disease: Secondary | ICD-10-CM | POA: Diagnosis not present

## 2020-05-16 DIAGNOSIS — Z992 Dependence on renal dialysis: Secondary | ICD-10-CM | POA: Diagnosis not present

## 2020-05-16 DIAGNOSIS — D631 Anemia in chronic kidney disease: Secondary | ICD-10-CM | POA: Diagnosis not present

## 2020-05-16 DIAGNOSIS — N2581 Secondary hyperparathyroidism of renal origin: Secondary | ICD-10-CM | POA: Diagnosis not present

## 2020-05-16 DIAGNOSIS — N186 End stage renal disease: Secondary | ICD-10-CM | POA: Diagnosis not present

## 2020-05-17 DIAGNOSIS — L97511 Non-pressure chronic ulcer of other part of right foot limited to breakdown of skin: Secondary | ICD-10-CM | POA: Diagnosis not present

## 2020-05-17 DIAGNOSIS — L97525 Non-pressure chronic ulcer of other part of left foot with muscle involvement without evidence of necrosis: Secondary | ICD-10-CM | POA: Diagnosis not present

## 2020-05-18 DIAGNOSIS — D631 Anemia in chronic kidney disease: Secondary | ICD-10-CM | POA: Diagnosis not present

## 2020-05-18 DIAGNOSIS — N186 End stage renal disease: Secondary | ICD-10-CM | POA: Diagnosis not present

## 2020-05-18 DIAGNOSIS — Z992 Dependence on renal dialysis: Secondary | ICD-10-CM | POA: Diagnosis not present

## 2020-05-18 DIAGNOSIS — N2581 Secondary hyperparathyroidism of renal origin: Secondary | ICD-10-CM | POA: Diagnosis not present

## 2020-05-20 DIAGNOSIS — N186 End stage renal disease: Secondary | ICD-10-CM | POA: Diagnosis not present

## 2020-05-20 DIAGNOSIS — Z992 Dependence on renal dialysis: Secondary | ICD-10-CM | POA: Diagnosis not present

## 2020-05-20 DIAGNOSIS — E1129 Type 2 diabetes mellitus with other diabetic kidney complication: Secondary | ICD-10-CM | POA: Diagnosis not present

## 2020-05-21 DIAGNOSIS — Z992 Dependence on renal dialysis: Secondary | ICD-10-CM | POA: Diagnosis not present

## 2020-05-21 DIAGNOSIS — N186 End stage renal disease: Secondary | ICD-10-CM | POA: Diagnosis not present

## 2020-05-21 DIAGNOSIS — D631 Anemia in chronic kidney disease: Secondary | ICD-10-CM | POA: Diagnosis not present

## 2020-05-21 DIAGNOSIS — N2581 Secondary hyperparathyroidism of renal origin: Secondary | ICD-10-CM | POA: Diagnosis not present

## 2020-05-23 DIAGNOSIS — N186 End stage renal disease: Secondary | ICD-10-CM | POA: Diagnosis not present

## 2020-05-23 DIAGNOSIS — D631 Anemia in chronic kidney disease: Secondary | ICD-10-CM | POA: Diagnosis not present

## 2020-05-23 DIAGNOSIS — N2581 Secondary hyperparathyroidism of renal origin: Secondary | ICD-10-CM | POA: Diagnosis not present

## 2020-05-23 DIAGNOSIS — Z992 Dependence on renal dialysis: Secondary | ICD-10-CM | POA: Diagnosis not present

## 2020-05-25 DIAGNOSIS — N2581 Secondary hyperparathyroidism of renal origin: Secondary | ICD-10-CM | POA: Diagnosis not present

## 2020-05-25 DIAGNOSIS — D631 Anemia in chronic kidney disease: Secondary | ICD-10-CM | POA: Diagnosis not present

## 2020-05-25 DIAGNOSIS — N186 End stage renal disease: Secondary | ICD-10-CM | POA: Diagnosis not present

## 2020-05-25 DIAGNOSIS — Z992 Dependence on renal dialysis: Secondary | ICD-10-CM | POA: Diagnosis not present

## 2020-05-28 DIAGNOSIS — N186 End stage renal disease: Secondary | ICD-10-CM | POA: Diagnosis not present

## 2020-05-28 DIAGNOSIS — N2581 Secondary hyperparathyroidism of renal origin: Secondary | ICD-10-CM | POA: Diagnosis not present

## 2020-05-28 DIAGNOSIS — D631 Anemia in chronic kidney disease: Secondary | ICD-10-CM | POA: Diagnosis not present

## 2020-05-28 DIAGNOSIS — Z992 Dependence on renal dialysis: Secondary | ICD-10-CM | POA: Diagnosis not present

## 2020-05-30 DIAGNOSIS — D631 Anemia in chronic kidney disease: Secondary | ICD-10-CM | POA: Diagnosis not present

## 2020-05-30 DIAGNOSIS — Z992 Dependence on renal dialysis: Secondary | ICD-10-CM | POA: Diagnosis not present

## 2020-05-30 DIAGNOSIS — N186 End stage renal disease: Secondary | ICD-10-CM | POA: Diagnosis not present

## 2020-05-30 DIAGNOSIS — N2581 Secondary hyperparathyroidism of renal origin: Secondary | ICD-10-CM | POA: Diagnosis not present

## 2020-06-01 DIAGNOSIS — N2581 Secondary hyperparathyroidism of renal origin: Secondary | ICD-10-CM | POA: Diagnosis not present

## 2020-06-01 DIAGNOSIS — N186 End stage renal disease: Secondary | ICD-10-CM | POA: Diagnosis not present

## 2020-06-01 DIAGNOSIS — Z992 Dependence on renal dialysis: Secondary | ICD-10-CM | POA: Diagnosis not present

## 2020-06-01 DIAGNOSIS — D631 Anemia in chronic kidney disease: Secondary | ICD-10-CM | POA: Diagnosis not present

## 2020-06-04 DIAGNOSIS — Z992 Dependence on renal dialysis: Secondary | ICD-10-CM | POA: Diagnosis not present

## 2020-06-04 DIAGNOSIS — N2581 Secondary hyperparathyroidism of renal origin: Secondary | ICD-10-CM | POA: Diagnosis not present

## 2020-06-04 DIAGNOSIS — D631 Anemia in chronic kidney disease: Secondary | ICD-10-CM | POA: Diagnosis not present

## 2020-06-04 DIAGNOSIS — N186 End stage renal disease: Secondary | ICD-10-CM | POA: Diagnosis not present

## 2020-06-06 DIAGNOSIS — N2581 Secondary hyperparathyroidism of renal origin: Secondary | ICD-10-CM | POA: Diagnosis not present

## 2020-06-06 DIAGNOSIS — D631 Anemia in chronic kidney disease: Secondary | ICD-10-CM | POA: Diagnosis not present

## 2020-06-06 DIAGNOSIS — N186 End stage renal disease: Secondary | ICD-10-CM | POA: Diagnosis not present

## 2020-06-06 DIAGNOSIS — Z992 Dependence on renal dialysis: Secondary | ICD-10-CM | POA: Diagnosis not present

## 2020-06-07 DIAGNOSIS — E1159 Type 2 diabetes mellitus with other circulatory complications: Secondary | ICD-10-CM | POA: Diagnosis not present

## 2020-06-07 DIAGNOSIS — L97525 Non-pressure chronic ulcer of other part of left foot with muscle involvement without evidence of necrosis: Secondary | ICD-10-CM | POA: Diagnosis not present

## 2020-06-08 DIAGNOSIS — D631 Anemia in chronic kidney disease: Secondary | ICD-10-CM | POA: Diagnosis not present

## 2020-06-08 DIAGNOSIS — Z992 Dependence on renal dialysis: Secondary | ICD-10-CM | POA: Diagnosis not present

## 2020-06-08 DIAGNOSIS — N2581 Secondary hyperparathyroidism of renal origin: Secondary | ICD-10-CM | POA: Diagnosis not present

## 2020-06-08 DIAGNOSIS — N186 End stage renal disease: Secondary | ICD-10-CM | POA: Diagnosis not present

## 2020-06-11 DIAGNOSIS — Z992 Dependence on renal dialysis: Secondary | ICD-10-CM | POA: Diagnosis not present

## 2020-06-11 DIAGNOSIS — D631 Anemia in chronic kidney disease: Secondary | ICD-10-CM | POA: Diagnosis not present

## 2020-06-11 DIAGNOSIS — N2581 Secondary hyperparathyroidism of renal origin: Secondary | ICD-10-CM | POA: Diagnosis not present

## 2020-06-11 DIAGNOSIS — N186 End stage renal disease: Secondary | ICD-10-CM | POA: Diagnosis not present

## 2020-06-13 DIAGNOSIS — N2581 Secondary hyperparathyroidism of renal origin: Secondary | ICD-10-CM | POA: Diagnosis not present

## 2020-06-13 DIAGNOSIS — N186 End stage renal disease: Secondary | ICD-10-CM | POA: Diagnosis not present

## 2020-06-13 DIAGNOSIS — D631 Anemia in chronic kidney disease: Secondary | ICD-10-CM | POA: Diagnosis not present

## 2020-06-13 DIAGNOSIS — Z992 Dependence on renal dialysis: Secondary | ICD-10-CM | POA: Diagnosis not present

## 2020-06-14 DIAGNOSIS — Z89431 Acquired absence of right foot: Secondary | ICD-10-CM | POA: Diagnosis not present

## 2020-06-14 DIAGNOSIS — E1159 Type 2 diabetes mellitus with other circulatory complications: Secondary | ICD-10-CM | POA: Diagnosis not present

## 2020-06-14 DIAGNOSIS — L97525 Non-pressure chronic ulcer of other part of left foot with muscle involvement without evidence of necrosis: Secondary | ICD-10-CM | POA: Diagnosis not present

## 2020-06-15 DIAGNOSIS — N186 End stage renal disease: Secondary | ICD-10-CM | POA: Diagnosis not present

## 2020-06-15 DIAGNOSIS — D631 Anemia in chronic kidney disease: Secondary | ICD-10-CM | POA: Diagnosis not present

## 2020-06-15 DIAGNOSIS — N2581 Secondary hyperparathyroidism of renal origin: Secondary | ICD-10-CM | POA: Diagnosis not present

## 2020-06-15 DIAGNOSIS — Z992 Dependence on renal dialysis: Secondary | ICD-10-CM | POA: Diagnosis not present

## 2020-06-20 DIAGNOSIS — Z992 Dependence on renal dialysis: Secondary | ICD-10-CM | POA: Diagnosis not present

## 2020-06-20 DIAGNOSIS — N2581 Secondary hyperparathyroidism of renal origin: Secondary | ICD-10-CM | POA: Diagnosis not present

## 2020-06-20 DIAGNOSIS — E1129 Type 2 diabetes mellitus with other diabetic kidney complication: Secondary | ICD-10-CM | POA: Diagnosis not present

## 2020-06-20 DIAGNOSIS — N186 End stage renal disease: Secondary | ICD-10-CM | POA: Diagnosis not present

## 2020-06-22 DIAGNOSIS — Z992 Dependence on renal dialysis: Secondary | ICD-10-CM | POA: Diagnosis not present

## 2020-06-22 DIAGNOSIS — E1129 Type 2 diabetes mellitus with other diabetic kidney complication: Secondary | ICD-10-CM | POA: Diagnosis not present

## 2020-06-22 DIAGNOSIS — N186 End stage renal disease: Secondary | ICD-10-CM | POA: Diagnosis not present

## 2020-06-22 DIAGNOSIS — N2581 Secondary hyperparathyroidism of renal origin: Secondary | ICD-10-CM | POA: Diagnosis not present

## 2020-06-26 DIAGNOSIS — Z992 Dependence on renal dialysis: Secondary | ICD-10-CM | POA: Diagnosis not present

## 2020-06-26 DIAGNOSIS — N186 End stage renal disease: Secondary | ICD-10-CM | POA: Diagnosis not present

## 2020-06-28 DIAGNOSIS — T782XXD Anaphylactic shock, unspecified, subsequent encounter: Secondary | ICD-10-CM | POA: Insufficient documentation

## 2020-06-28 DIAGNOSIS — T7840XD Allergy, unspecified, subsequent encounter: Secondary | ICD-10-CM | POA: Insufficient documentation

## 2020-06-29 DIAGNOSIS — N186 End stage renal disease: Secondary | ICD-10-CM | POA: Diagnosis not present

## 2020-06-29 DIAGNOSIS — Z992 Dependence on renal dialysis: Secondary | ICD-10-CM | POA: Diagnosis not present

## 2020-07-02 DIAGNOSIS — N186 End stage renal disease: Secondary | ICD-10-CM | POA: Diagnosis not present

## 2020-07-02 DIAGNOSIS — N2581 Secondary hyperparathyroidism of renal origin: Secondary | ICD-10-CM | POA: Diagnosis not present

## 2020-07-02 DIAGNOSIS — Z992 Dependence on renal dialysis: Secondary | ICD-10-CM | POA: Diagnosis not present

## 2020-07-02 DIAGNOSIS — E1129 Type 2 diabetes mellitus with other diabetic kidney complication: Secondary | ICD-10-CM | POA: Diagnosis not present

## 2020-07-04 DIAGNOSIS — N2581 Secondary hyperparathyroidism of renal origin: Secondary | ICD-10-CM | POA: Diagnosis not present

## 2020-07-04 DIAGNOSIS — Z992 Dependence on renal dialysis: Secondary | ICD-10-CM | POA: Diagnosis not present

## 2020-07-04 DIAGNOSIS — E1129 Type 2 diabetes mellitus with other diabetic kidney complication: Secondary | ICD-10-CM | POA: Diagnosis not present

## 2020-07-04 DIAGNOSIS — N186 End stage renal disease: Secondary | ICD-10-CM | POA: Diagnosis not present

## 2020-07-05 DIAGNOSIS — L97525 Non-pressure chronic ulcer of other part of left foot with muscle involvement without evidence of necrosis: Secondary | ICD-10-CM | POA: Diagnosis not present

## 2020-07-05 DIAGNOSIS — I739 Peripheral vascular disease, unspecified: Secondary | ICD-10-CM | POA: Diagnosis not present

## 2020-07-06 DIAGNOSIS — E1129 Type 2 diabetes mellitus with other diabetic kidney complication: Secondary | ICD-10-CM | POA: Diagnosis not present

## 2020-07-06 DIAGNOSIS — N186 End stage renal disease: Secondary | ICD-10-CM | POA: Diagnosis not present

## 2020-07-06 DIAGNOSIS — N2581 Secondary hyperparathyroidism of renal origin: Secondary | ICD-10-CM | POA: Diagnosis not present

## 2020-07-06 DIAGNOSIS — Z992 Dependence on renal dialysis: Secondary | ICD-10-CM | POA: Diagnosis not present

## 2020-07-09 DIAGNOSIS — N186 End stage renal disease: Secondary | ICD-10-CM | POA: Diagnosis not present

## 2020-07-09 DIAGNOSIS — N2581 Secondary hyperparathyroidism of renal origin: Secondary | ICD-10-CM | POA: Diagnosis not present

## 2020-07-09 DIAGNOSIS — Z992 Dependence on renal dialysis: Secondary | ICD-10-CM | POA: Diagnosis not present

## 2020-07-09 DIAGNOSIS — E1129 Type 2 diabetes mellitus with other diabetic kidney complication: Secondary | ICD-10-CM | POA: Diagnosis not present

## 2020-07-11 DIAGNOSIS — Z992 Dependence on renal dialysis: Secondary | ICD-10-CM | POA: Diagnosis not present

## 2020-07-11 DIAGNOSIS — E1129 Type 2 diabetes mellitus with other diabetic kidney complication: Secondary | ICD-10-CM | POA: Diagnosis not present

## 2020-07-11 DIAGNOSIS — N186 End stage renal disease: Secondary | ICD-10-CM | POA: Diagnosis not present

## 2020-07-11 DIAGNOSIS — N2581 Secondary hyperparathyroidism of renal origin: Secondary | ICD-10-CM | POA: Diagnosis not present

## 2020-07-12 DIAGNOSIS — I739 Peripheral vascular disease, unspecified: Secondary | ICD-10-CM | POA: Diagnosis not present

## 2020-07-12 DIAGNOSIS — L97525 Non-pressure chronic ulcer of other part of left foot with muscle involvement without evidence of necrosis: Secondary | ICD-10-CM | POA: Diagnosis not present

## 2020-07-13 DIAGNOSIS — Z992 Dependence on renal dialysis: Secondary | ICD-10-CM | POA: Diagnosis not present

## 2020-07-13 DIAGNOSIS — E1129 Type 2 diabetes mellitus with other diabetic kidney complication: Secondary | ICD-10-CM | POA: Diagnosis not present

## 2020-07-13 DIAGNOSIS — N186 End stage renal disease: Secondary | ICD-10-CM | POA: Diagnosis not present

## 2020-07-13 DIAGNOSIS — N2581 Secondary hyperparathyroidism of renal origin: Secondary | ICD-10-CM | POA: Diagnosis not present

## 2020-07-16 DIAGNOSIS — N2581 Secondary hyperparathyroidism of renal origin: Secondary | ICD-10-CM | POA: Diagnosis not present

## 2020-07-16 DIAGNOSIS — Z992 Dependence on renal dialysis: Secondary | ICD-10-CM | POA: Diagnosis not present

## 2020-07-16 DIAGNOSIS — N186 End stage renal disease: Secondary | ICD-10-CM | POA: Diagnosis not present

## 2020-07-16 DIAGNOSIS — E1129 Type 2 diabetes mellitus with other diabetic kidney complication: Secondary | ICD-10-CM | POA: Diagnosis not present

## 2020-07-18 DIAGNOSIS — N2581 Secondary hyperparathyroidism of renal origin: Secondary | ICD-10-CM | POA: Diagnosis not present

## 2020-07-18 DIAGNOSIS — N186 End stage renal disease: Secondary | ICD-10-CM | POA: Diagnosis not present

## 2020-07-18 DIAGNOSIS — E1129 Type 2 diabetes mellitus with other diabetic kidney complication: Secondary | ICD-10-CM | POA: Diagnosis not present

## 2020-07-18 DIAGNOSIS — Z992 Dependence on renal dialysis: Secondary | ICD-10-CM | POA: Diagnosis not present

## 2020-07-20 DIAGNOSIS — N186 End stage renal disease: Secondary | ICD-10-CM | POA: Diagnosis not present

## 2020-07-20 DIAGNOSIS — N2581 Secondary hyperparathyroidism of renal origin: Secondary | ICD-10-CM | POA: Diagnosis not present

## 2020-07-20 DIAGNOSIS — Z992 Dependence on renal dialysis: Secondary | ICD-10-CM | POA: Diagnosis not present

## 2020-07-20 DIAGNOSIS — E1129 Type 2 diabetes mellitus with other diabetic kidney complication: Secondary | ICD-10-CM | POA: Diagnosis not present

## 2020-07-20 DIAGNOSIS — E8779 Other fluid overload: Secondary | ICD-10-CM | POA: Diagnosis not present

## 2020-07-20 DIAGNOSIS — D631 Anemia in chronic kidney disease: Secondary | ICD-10-CM | POA: Diagnosis not present

## 2020-07-23 DIAGNOSIS — Z992 Dependence on renal dialysis: Secondary | ICD-10-CM | POA: Diagnosis not present

## 2020-07-23 DIAGNOSIS — E8779 Other fluid overload: Secondary | ICD-10-CM | POA: Diagnosis not present

## 2020-07-23 DIAGNOSIS — D631 Anemia in chronic kidney disease: Secondary | ICD-10-CM | POA: Diagnosis not present

## 2020-07-23 DIAGNOSIS — N186 End stage renal disease: Secondary | ICD-10-CM | POA: Diagnosis not present

## 2020-07-23 DIAGNOSIS — N2581 Secondary hyperparathyroidism of renal origin: Secondary | ICD-10-CM | POA: Diagnosis not present

## 2020-07-25 DIAGNOSIS — N2581 Secondary hyperparathyroidism of renal origin: Secondary | ICD-10-CM | POA: Diagnosis not present

## 2020-07-25 DIAGNOSIS — E8779 Other fluid overload: Secondary | ICD-10-CM | POA: Diagnosis not present

## 2020-07-25 DIAGNOSIS — N186 End stage renal disease: Secondary | ICD-10-CM | POA: Diagnosis not present

## 2020-07-25 DIAGNOSIS — Z992 Dependence on renal dialysis: Secondary | ICD-10-CM | POA: Diagnosis not present

## 2020-07-25 DIAGNOSIS — D631 Anemia in chronic kidney disease: Secondary | ICD-10-CM | POA: Diagnosis not present

## 2020-07-25 DIAGNOSIS — E1129 Type 2 diabetes mellitus with other diabetic kidney complication: Secondary | ICD-10-CM | POA: Diagnosis not present

## 2020-07-26 DIAGNOSIS — L97525 Non-pressure chronic ulcer of other part of left foot with muscle involvement without evidence of necrosis: Secondary | ICD-10-CM | POA: Diagnosis not present

## 2020-07-27 DIAGNOSIS — Z992 Dependence on renal dialysis: Secondary | ICD-10-CM | POA: Diagnosis not present

## 2020-07-27 DIAGNOSIS — E8779 Other fluid overload: Secondary | ICD-10-CM | POA: Diagnosis not present

## 2020-07-27 DIAGNOSIS — D631 Anemia in chronic kidney disease: Secondary | ICD-10-CM | POA: Diagnosis not present

## 2020-07-27 DIAGNOSIS — N2581 Secondary hyperparathyroidism of renal origin: Secondary | ICD-10-CM | POA: Diagnosis not present

## 2020-07-27 DIAGNOSIS — N186 End stage renal disease: Secondary | ICD-10-CM | POA: Diagnosis not present

## 2020-08-01 DIAGNOSIS — Z992 Dependence on renal dialysis: Secondary | ICD-10-CM | POA: Diagnosis not present

## 2020-08-01 DIAGNOSIS — D631 Anemia in chronic kidney disease: Secondary | ICD-10-CM | POA: Diagnosis not present

## 2020-08-01 DIAGNOSIS — E8779 Other fluid overload: Secondary | ICD-10-CM | POA: Diagnosis not present

## 2020-08-01 DIAGNOSIS — N186 End stage renal disease: Secondary | ICD-10-CM | POA: Diagnosis not present

## 2020-08-01 DIAGNOSIS — N2581 Secondary hyperparathyroidism of renal origin: Secondary | ICD-10-CM | POA: Diagnosis not present

## 2020-08-02 DIAGNOSIS — Z89431 Acquired absence of right foot: Secondary | ICD-10-CM | POA: Diagnosis not present

## 2020-08-02 DIAGNOSIS — R9431 Abnormal electrocardiogram [ECG] [EKG]: Secondary | ICD-10-CM | POA: Diagnosis not present

## 2020-08-02 DIAGNOSIS — R011 Cardiac murmur, unspecified: Secondary | ICD-10-CM | POA: Diagnosis not present

## 2020-08-02 DIAGNOSIS — D631 Anemia in chronic kidney disease: Secondary | ICD-10-CM | POA: Diagnosis not present

## 2020-08-02 DIAGNOSIS — Z992 Dependence on renal dialysis: Secondary | ICD-10-CM | POA: Diagnosis not present

## 2020-08-02 DIAGNOSIS — I10 Essential (primary) hypertension: Secondary | ICD-10-CM | POA: Diagnosis not present

## 2020-08-02 DIAGNOSIS — I358 Other nonrheumatic aortic valve disorders: Secondary | ICD-10-CM | POA: Diagnosis not present

## 2020-08-02 DIAGNOSIS — Z01818 Encounter for other preprocedural examination: Secondary | ICD-10-CM | POA: Diagnosis not present

## 2020-08-02 DIAGNOSIS — N186 End stage renal disease: Secondary | ICD-10-CM | POA: Diagnosis not present

## 2020-08-02 DIAGNOSIS — R0683 Snoring: Secondary | ICD-10-CM | POA: Diagnosis not present

## 2020-08-02 DIAGNOSIS — I361 Nonrheumatic tricuspid (valve) insufficiency: Secondary | ICD-10-CM | POA: Diagnosis not present

## 2020-08-02 DIAGNOSIS — E11621 Type 2 diabetes mellitus with foot ulcer: Secondary | ICD-10-CM | POA: Diagnosis not present

## 2020-08-02 DIAGNOSIS — E1122 Type 2 diabetes mellitus with diabetic chronic kidney disease: Secondary | ICD-10-CM | POA: Diagnosis not present

## 2020-08-02 DIAGNOSIS — L97509 Non-pressure chronic ulcer of other part of unspecified foot with unspecified severity: Secondary | ICD-10-CM | POA: Diagnosis not present

## 2020-08-02 DIAGNOSIS — I739 Peripheral vascular disease, unspecified: Secondary | ICD-10-CM | POA: Diagnosis not present

## 2020-08-03 ENCOUNTER — Telehealth: Payer: Self-pay | Admitting: *Deleted

## 2020-08-03 DIAGNOSIS — N2581 Secondary hyperparathyroidism of renal origin: Secondary | ICD-10-CM | POA: Diagnosis not present

## 2020-08-03 DIAGNOSIS — Z992 Dependence on renal dialysis: Secondary | ICD-10-CM | POA: Diagnosis not present

## 2020-08-03 DIAGNOSIS — E8779 Other fluid overload: Secondary | ICD-10-CM | POA: Diagnosis not present

## 2020-08-03 DIAGNOSIS — D631 Anemia in chronic kidney disease: Secondary | ICD-10-CM | POA: Diagnosis not present

## 2020-08-03 DIAGNOSIS — N186 End stage renal disease: Secondary | ICD-10-CM | POA: Diagnosis not present

## 2020-08-03 NOTE — Telephone Encounter (Signed)
° °  [facility] Pre-operative Risk Assessment    HEARTCARE STAFF: - Please ensure there is not already an duplicate clearance open for this procedure. - Under Visit Info/Reason for Call, type in Other and utilize the format Clearance MM/DD/YY or Clearance TBD. Do not use dashes or single digits. - If request is for dental extraction, please clarify the # of teeth to be extracted.  Request for surgical clearance: WILL PLACE PAPER NOTES IN DR. Marlou Porch BOX   1. What type of surgery is being performed? LOWER EXTREMITY ANGIOGRAPHY   2. When is this surgery scheduled? 08/13/20   3. What type of clearance is required (medical clearance vs. Pharmacy clearance to hold med vs. Both)? MEDICAL  4. Are there any medications that need to be held prior to surgery and how long? ASA    5. Practice name and name of physician performing surgery? [facility]; IVETTE ALVAREZ, PAC  6. What is the office phone number? [phone]   7.   What is the office fax number? [place]: IVETTE  8.   Anesthesia type (None, local, MAC, general) ? NOT LISTED; GENERAL?   Julaine Hua 08/03/2020, 4:59 PM  _________________________________________________________________   (provider comments below)

## 2020-08-05 ENCOUNTER — Observation Stay (HOSPITAL_COMMUNITY): Payer: Medicare Other

## 2020-08-05 ENCOUNTER — Other Ambulatory Visit: Payer: Self-pay

## 2020-08-05 ENCOUNTER — Encounter (HOSPITAL_COMMUNITY): Payer: Self-pay | Admitting: Internal Medicine

## 2020-08-05 ENCOUNTER — Inpatient Hospital Stay (HOSPITAL_COMMUNITY)
Admission: EM | Admit: 2020-08-05 | Discharge: 2020-08-09 | DRG: 286 | Disposition: A | Discharge status: Home / Self Care | Payer: Medicare Other | Attending: Internal Medicine | Admitting: Internal Medicine

## 2020-08-05 ENCOUNTER — Emergency Department (HOSPITAL_COMMUNITY): Payer: Medicare Other

## 2020-08-05 DIAGNOSIS — L97525 Non-pressure chronic ulcer of other part of left foot with muscle involvement without evidence of necrosis: Secondary | ICD-10-CM | POA: Diagnosis not present

## 2020-08-05 DIAGNOSIS — Z7982 Long term (current) use of aspirin: Secondary | ICD-10-CM | POA: Diagnosis not present

## 2020-08-05 DIAGNOSIS — K130 Diseases of lips: Secondary | ICD-10-CM | POA: Diagnosis present

## 2020-08-05 DIAGNOSIS — G43909 Migraine, unspecified, not intractable, without status migrainosus: Secondary | ICD-10-CM | POA: Diagnosis present

## 2020-08-05 DIAGNOSIS — I25110 Atherosclerotic heart disease of native coronary artery with unstable angina pectoris: Secondary | ICD-10-CM | POA: Diagnosis not present

## 2020-08-05 DIAGNOSIS — N186 End stage renal disease: Secondary | ICD-10-CM | POA: Diagnosis not present

## 2020-08-05 DIAGNOSIS — R079 Chest pain, unspecified: Secondary | ICD-10-CM | POA: Diagnosis present

## 2020-08-05 DIAGNOSIS — R110 Nausea: Secondary | ICD-10-CM | POA: Diagnosis not present

## 2020-08-05 DIAGNOSIS — T82838A Hemorrhage of vascular prosthetic devices, implants and grafts, initial encounter: Secondary | ICD-10-CM | POA: Diagnosis not present

## 2020-08-05 DIAGNOSIS — Z9861 Coronary angioplasty status: Secondary | ICD-10-CM | POA: Diagnosis not present

## 2020-08-05 DIAGNOSIS — Z20822 Contact with and (suspected) exposure to covid-19: Secondary | ICD-10-CM | POA: Diagnosis present

## 2020-08-05 DIAGNOSIS — Z79899 Other long term (current) drug therapy: Secondary | ICD-10-CM | POA: Diagnosis not present

## 2020-08-05 DIAGNOSIS — L309 Dermatitis, unspecified: Secondary | ICD-10-CM | POA: Diagnosis present

## 2020-08-05 DIAGNOSIS — I2510 Atherosclerotic heart disease of native coronary artery without angina pectoris: Secondary | ICD-10-CM

## 2020-08-05 DIAGNOSIS — Z885 Allergy status to narcotic agent status: Secondary | ICD-10-CM

## 2020-08-05 DIAGNOSIS — Z880 Allergy status to penicillin: Secondary | ICD-10-CM

## 2020-08-05 DIAGNOSIS — E7800 Pure hypercholesterolemia, unspecified: Secondary | ICD-10-CM | POA: Diagnosis present

## 2020-08-05 DIAGNOSIS — Z7901 Long term (current) use of anticoagulants: Secondary | ICD-10-CM | POA: Diagnosis not present

## 2020-08-05 DIAGNOSIS — K219 Gastro-esophageal reflux disease without esophagitis: Secondary | ICD-10-CM | POA: Diagnosis present

## 2020-08-05 DIAGNOSIS — I10 Essential (primary) hypertension: Secondary | ICD-10-CM | POA: Diagnosis not present

## 2020-08-05 DIAGNOSIS — R1031 Right lower quadrant pain: Secondary | ICD-10-CM | POA: Diagnosis not present

## 2020-08-05 DIAGNOSIS — E1122 Type 2 diabetes mellitus with diabetic chronic kidney disease: Secondary | ICD-10-CM

## 2020-08-05 DIAGNOSIS — I120 Hypertensive chronic kidney disease with stage 5 chronic kidney disease or end stage renal disease: Secondary | ICD-10-CM | POA: Diagnosis not present

## 2020-08-05 DIAGNOSIS — E11319 Type 2 diabetes mellitus with unspecified diabetic retinopathy without macular edema: Secondary | ICD-10-CM | POA: Diagnosis present

## 2020-08-05 DIAGNOSIS — N2581 Secondary hyperparathyroidism of renal origin: Secondary | ICD-10-CM | POA: Diagnosis present

## 2020-08-05 DIAGNOSIS — E1151 Type 2 diabetes mellitus with diabetic peripheral angiopathy without gangrene: Secondary | ICD-10-CM | POA: Diagnosis present

## 2020-08-05 DIAGNOSIS — Z89422 Acquired absence of other left toe(s): Secondary | ICD-10-CM

## 2020-08-05 DIAGNOSIS — Z833 Family history of diabetes mellitus: Secondary | ICD-10-CM | POA: Diagnosis not present

## 2020-08-05 DIAGNOSIS — H5462 Unqualified visual loss, left eye, normal vision right eye: Secondary | ICD-10-CM | POA: Diagnosis not present

## 2020-08-05 DIAGNOSIS — E1139 Type 2 diabetes mellitus with other diabetic ophthalmic complication: Secondary | ICD-10-CM

## 2020-08-05 DIAGNOSIS — M898X9 Other specified disorders of bone, unspecified site: Secondary | ICD-10-CM | POA: Diagnosis present

## 2020-08-05 DIAGNOSIS — R011 Cardiac murmur, unspecified: Secondary | ICD-10-CM | POA: Diagnosis not present

## 2020-08-05 DIAGNOSIS — Z992 Dependence on renal dialysis: Secondary | ICD-10-CM

## 2020-08-05 DIAGNOSIS — I9789 Other postprocedural complications and disorders of the circulatory system, not elsewhere classified: Secondary | ICD-10-CM | POA: Diagnosis not present

## 2020-08-05 DIAGNOSIS — I739 Peripheral vascular disease, unspecified: Secondary | ICD-10-CM

## 2020-08-05 DIAGNOSIS — I272 Pulmonary hypertension, unspecified: Secondary | ICD-10-CM | POA: Diagnosis present

## 2020-08-05 DIAGNOSIS — D61818 Other pancytopenia: Secondary | ICD-10-CM

## 2020-08-05 DIAGNOSIS — D631 Anemia in chronic kidney disease: Secondary | ICD-10-CM | POA: Diagnosis present

## 2020-08-05 DIAGNOSIS — G8918 Other acute postprocedural pain: Secondary | ICD-10-CM | POA: Diagnosis not present

## 2020-08-05 DIAGNOSIS — E785 Hyperlipidemia, unspecified: Secondary | ICD-10-CM

## 2020-08-05 DIAGNOSIS — R600 Localized edema: Secondary | ICD-10-CM

## 2020-08-05 DIAGNOSIS — E119 Type 2 diabetes mellitus without complications: Secondary | ICD-10-CM

## 2020-08-05 DIAGNOSIS — E8770 Fluid overload, unspecified: Secondary | ICD-10-CM | POA: Diagnosis not present

## 2020-08-05 LAB — COMPREHENSIVE METABOLIC PANEL
ALT: 28 U/L (ref 0–44)
AST: 26 U/L (ref 15–41)
Albumin: 3.5 g/dL (ref 3.5–5.0)
Alkaline Phosphatase: 138 U/L — ABNORMAL HIGH (ref 38–126)
Anion gap: 17 — ABNORMAL HIGH (ref 5–15)
BUN: 73 mg/dL — ABNORMAL HIGH (ref 6–20)
CO2: 22 mmol/L (ref 22–32)
Calcium: 9.1 mg/dL (ref 8.9–10.3)
Chloride: 99 mmol/L (ref 98–111)
Creatinine, Ser: 10.58 mg/dL — ABNORMAL HIGH (ref 0.61–1.24)
GFR, Estimated: 5 mL/min — ABNORMAL LOW (ref >60)
Glucose, Bld: 127 mg/dL — ABNORMAL HIGH (ref 70–99)
Potassium: 5.9 mmol/L — ABNORMAL HIGH (ref 3.5–5.1)
Sodium: 138 mmol/L (ref 135–145)
Total Bilirubin: 1.4 mg/dL — ABNORMAL HIGH (ref 0.3–1.2)
Total Protein: 7.1 g/dL (ref 6.5–8.1)

## 2020-08-05 LAB — RESPIRATORY PANEL BY RT PCR (FLU A&B, COVID)
Influenza A by PCR: NEGATIVE
Influenza B by PCR: NEGATIVE
SARS Coronavirus 2 by RT PCR: NEGATIVE

## 2020-08-05 LAB — CBC
HCT: 33.4 % — ABNORMAL LOW (ref 39.0–52.0)
Hemoglobin: 10.3 g/dL — ABNORMAL LOW (ref 13.0–17.0)
MCH: 27.8 pg (ref 26.0–34.0)
MCHC: 30.8 g/dL (ref 30.0–36.0)
MCV: 90 fL (ref 80.0–100.0)
Platelets: 99 10*3/uL — ABNORMAL LOW (ref 150–400)
RBC: 3.71 MIL/uL — ABNORMAL LOW (ref 4.22–5.81)
RDW: 21.5 % — ABNORMAL HIGH (ref 11.5–15.5)
WBC: 3.2 10*3/uL — ABNORMAL LOW (ref 4.0–10.5)
nRBC: 0 % (ref 0.0–0.2)

## 2020-08-05 LAB — TROPONIN I (HIGH SENSITIVITY)
Troponin I (High Sensitivity): 31 ng/L — ABNORMAL HIGH (ref <18)
Troponin I (High Sensitivity): 31 ng/L — ABNORMAL HIGH (ref <18)
Troponin I (High Sensitivity): 27 ng/L — ABNORMAL HIGH (ref <18)
Troponin I (High Sensitivity): 27 ng/L — ABNORMAL HIGH (ref <18)

## 2020-08-05 LAB — BASIC METABOLIC PANEL
Anion gap: 15 (ref 5–15)
BUN: 68 mg/dL — ABNORMAL HIGH (ref 6–20)
CO2: 27 mmol/L (ref 22–32)
Calcium: 9.4 mg/dL (ref 8.9–10.3)
Chloride: 97 mmol/L — ABNORMAL LOW (ref 98–111)
Creatinine, Ser: 9.92 mg/dL — ABNORMAL HIGH (ref 0.61–1.24)
GFR, Estimated: 5 mL/min — ABNORMAL LOW (ref >60)
Glucose, Bld: 117 mg/dL — ABNORMAL HIGH (ref 70–99)
Potassium: 5 mmol/L (ref 3.5–5.1)
Sodium: 139 mmol/L (ref 135–145)

## 2020-08-05 LAB — CBG MONITORING, ED: Glucose-Capillary: 111 mg/dL — ABNORMAL HIGH (ref 70–99)

## 2020-08-05 LAB — HIV ANTIBODY (ROUTINE TESTING W REFLEX): HIV Screen 4th Generation wRfx: NONREACTIVE

## 2020-08-05 LAB — D-DIMER, QUANTITATIVE: D-Dimer, Quant: 0.61 ug/mL-FEU — ABNORMAL HIGH (ref 0.00–0.50)

## 2020-08-05 LAB — GLUCOSE, CAPILLARY: Glucose-Capillary: 147 mg/dL — ABNORMAL HIGH (ref 70–99)

## 2020-08-05 LAB — LIPASE, BLOOD: Lipase: 25 U/L (ref 11–51)

## 2020-08-05 MED ORDER — SEVELAMER CARBONATE 800 MG PO TABS
800.0000 mg | ORAL_TABLET | Freq: Three times a day (TID) | ORAL | Status: DC
  Administered 2020-08-05 – 2020-08-06 (×3): 800 mg via ORAL
  Filled 2020-08-05 (×4): qty 1

## 2020-08-05 MED ORDER — ASPIRIN EC 81 MG PO TBEC
81.0000 mg | DELAYED_RELEASE_TABLET | Freq: Every day | ORAL | Status: DC
  Administered 2020-08-05 – 2020-08-09 (×5): 81 mg via ORAL
  Filled 2020-08-05 (×5): qty 1

## 2020-08-05 MED ORDER — ACETAMINOPHEN 325 MG PO TABS
650.0000 mg | ORAL_TABLET | Freq: Four times a day (QID) | ORAL | Status: DC | PRN
  Administered 2020-08-05 (×2): 650 mg via ORAL
  Filled 2020-08-05: qty 2

## 2020-08-05 MED ORDER — RAMELTEON 8 MG PO TABS
8.0000 mg | ORAL_TABLET | Freq: Every day | ORAL | Status: DC
  Administered 2020-08-06 – 2020-08-08 (×4): 8 mg via ORAL
  Filled 2020-08-05 (×5): qty 1

## 2020-08-05 MED ORDER — SEVELAMER CARBONATE 800 MG PO TABS: 800.0000 mg | ORAL_TABLET | Freq: Three times a day (TID) | ORAL | Status: DC

## 2020-08-05 MED ORDER — SODIUM ZIRCONIUM CYCLOSILICATE 10 G PO PACK
10.0000 g | PACK | Freq: Once | ORAL | Status: AC
  Administered 2020-08-05: 10 g via ORAL
  Filled 2020-08-05: qty 1

## 2020-08-05 MED ORDER — INSULIN ASPART 100 UNIT/ML ~~LOC~~ SOLN: 0.0000 [IU] | Freq: Three times a day (TID) | SUBCUTANEOUS | Status: DC

## 2020-08-05 MED ORDER — MELATONIN 5 MG PO TABS
10.0000 mg | ORAL_TABLET | Freq: Every evening | ORAL | Status: DC | PRN
  Administered 2020-08-05: 10 mg via ORAL
  Filled 2020-08-05: qty 2

## 2020-08-05 MED ORDER — INSULIN ASPART 100 UNIT/ML ~~LOC~~ SOLN: 0.0000 [IU] | Freq: Every day | SUBCUTANEOUS | Status: DC

## 2020-08-05 MED ORDER — ONDANSETRON HCL 4 MG/2ML IJ SOLN: 4.0000 mg | Freq: Four times a day (QID) | INTRAMUSCULAR | Status: DC | PRN

## 2020-08-05 MED ORDER — AMLODIPINE BESYLATE 10 MG PO TABS
10.0000 mg | ORAL_TABLET | Freq: Every day | ORAL | Status: DC
  Administered 2020-08-05 – 2020-08-09 (×5): 10 mg via ORAL
  Filled 2020-08-05: qty 2
  Filled 2020-08-05 (×5): qty 1

## 2020-08-05 MED ORDER — IOHEXOL 350 MG/ML SOLN
100.0000 mL | Freq: Once | INTRAVENOUS | Status: AC | PRN
  Administered 2020-08-05: 100 mL via INTRAVENOUS

## 2020-08-05 MED ORDER — RAMELTEON 8 MG PO TABS: 8.0000 mg | ORAL_TABLET | Freq: Every day | ORAL | Status: DC

## 2020-08-05 MED ORDER — NITROGLYCERIN 0.4 MG SL SUBL
0.4000 mg | SUBLINGUAL_TABLET | Freq: Once | SUBLINGUAL | Status: AC
  Administered 2020-08-05: 0.4 mg via SUBLINGUAL
  Filled 2020-08-05: qty 1

## 2020-08-05 MED ORDER — CARVEDILOL 3.125 MG PO TABS
3.1250 mg | ORAL_TABLET | Freq: Two times a day (BID) | ORAL | Status: DC
  Administered 2020-08-05: 3.125 mg via ORAL
  Filled 2020-08-05 (×3): qty 1

## 2020-08-05 MED ORDER — ATORVASTATIN CALCIUM 40 MG PO TABS
40.0000 mg | ORAL_TABLET | Freq: Every day | ORAL | Status: DC
  Administered 2020-08-05 – 2020-08-09 (×5): 40 mg via ORAL
  Filled 2020-08-05 (×5): qty 1

## 2020-08-05 NOTE — ED Notes (Signed)
Admitting provider notified about pain medication request, admitting provider directly tall to pt over the phone no orders for pain medication gotten at this time from provider.

## 2020-08-05 NOTE — H&P (Addendum)
Date: 08/05/2020               Patient Name:  [name] MRN: [id number]  DOB: May 31, 1966 Age / Sex: 54 y.o., male   PCP: Wellington Hampshire, MD         Medical Service: Internal Medicine Teaching Service         Attending Physician: Dr. Rebeca Alert Raynaldo Opitz, MD    First Contact: Dr. Candie Chroman Pager: [phone]  Second Contact: Dr. Court Joy Pager: [phone]       After Hours (After 5p/  First Contact Pager: [phone]  weekends / holidays): Second Contact Pager: [phone]   Chief Complaint: Left sided chest pain  History of Present Illness: [name] is a 54yo male with PMH of T2DM, ESRD w/ HD MWF, transmetatarsal amputation of the R foot, HTN, PAD, and HLD who presented with left side chest pain. Patient reports that he has been having intermittent chest pain for the past 3 days. He reports that the pain is worse at night and when he is resting. The pain radiates down his left arm and he also has left leg pain simultaneously. He reports that during the worse moments his left arm and leg feel "heavy and dead." Patient has had decreased appetite and increased early satiety recently. He has lost weight over the past year. While some of it may have been intentional he thinks this weight change has been more than expected.  Patient has also reported that he has been dry heaving over the past week and this usually occurs after eating; however he has noted increased nausea that happens intermittently. Nausea is associated with dry heaving and also happens throughout the day. Patient has had no emesis or diarrhea. 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    Patient wife is in the room and assist in providing HPI. She reports that last week the patient was called and notified that he has "a leaky valve."  Patient sees Cardiology at [facility]. Per report his echo  08/02/2020 noted calculated echo of 63% w/ Grade 3 DD. His Left Atrium and Right ventricle were noted to be mildly enlarged. No effusion was noted with trivial MR, trivial PR regurgitation, and moderate tricuspid valve regurgitation. He was started on amlodopine a few days ago. He has history of high blood pressure, and describes a history of white coat syndrome. Patient notes he has been recommended to get a sleep study as he has had increased difficulty breathing during the night over the past month. He feels better sleeping upright. Per wife she does not feel that the patient snores often, but does corroborate that he has had decreased sleep.  - Patient is Covid (-) this admission and unvaccinated.  Meds:  Current Meds  Medication Sig  . amLODipine (NORVASC) 10 MG tablet Take 10 mg by mouth daily.     Allergies: Allergies as of 08/05/2020 - Review Complete 08/05/2020  Allergen Reaction Noted  . Amoxicillin Itching 11/08/2012  . Hydromorphone Itching 08/23/2013  . Percocet [oxycodone-acetaminophen] Itching and Other (See Comments) 05/14/2011   Past Medical History:  Diagnosis Date  . Abscess of right foot   . Acute osteomyelitis of right foot ([place]) 05/15/2019  . Anemia 06/23/2019  . Cellulitis of right foot 05/22/2019  . Chest pain 08/23/2013  . Chills    at  night - sometimes  . Chronic kidney disease, stage V ([place]) 08/23/2013  . Complication of anesthesia   . Diabetes mellitus    controlled by diet  . 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 Diabetic foot ulcer- right second toe 05/14/2019  . Diabetic infection of right foot ([place])   . Diabetic retinopathy associated with type 2 diabetes mellitus ([place]) 06/23/2019  . Eczema   . ESRD on dialysis ([place]) 07/18/2011  . Essential hypertension 10/18/2007   Qualifier: Diagnosis of  By: Loanne Drilling MD, Jacelyn Pi   . Fatigue    loss of fatigue  . FOOT ULCER, LEFT 06/29/2008   Qualifier: Diagnosis of  By: Loanne Drilling MD, Sean A   . Gangrene  of toe of right foot ([place])   . Headache(784.0)    Years ago  . Heart murmur    years ago  . Hemodialysis status ([place]) 08/23/2013  . History of blood transfusion   . Hyperglycemia 05/05/2015  . HYPERLIPIDEMIA 05/16/2008   Qualifier: Diagnosis of  By: Jenny Reichmann MD, Hunt Oris   . Hypertension    sees Dr. Jaci Standard  . Hypoglycemia, unspecified 11/13/2008   Qualifier: Diagnosis of  By: Jenny Reichmann MD, Hunt Oris   . MRSA (methicillin resistant staph aureus) culture positive   . MRSA INFECTION 10/18/2007   Qualifier: Diagnosis of  By: Marca Ancona RMA, Lucy    . Obesity, unspecified 08/23/2013  . Osteomyelitis of great toe of right foot ([place]) 05/14/2019  . Other forms of retinal detachment(361.89) 06/23/2019  . PONV (postoperative nausea and vomiting)   . Poor circulation   . Renal disorder   . RENAL INSUFFICIENCY 07/31/2008   Qualifier: Diagnosis of  By: Jenny Reichmann MD, Hunt Oris   . Secondary hyperparathyroidism ([place]) 06/23/2019  . Sepsis ([place]) 05/14/2019  . Type 2 diabetes mellitus ([place]) 08/23/2013  . Ulcer of right foot, with necrosis of bone ([place])   . Ulcer of right second toe with necrosis of bone ([place])     Family History:  Family History  Problem Relation Age of Onset  . Diabetes Mother     Social History:    Social Connections:   . Frequency of Communication with Friends and Family: Not on file  . Frequency of Social Gatherings with Friends and Family: Not on file  . Attends Religious Services: Not on file  . Active Member of Clubs or Organizations: Not on file  . Attends Archivist Meetings: Not on file  . Marital Status: Married    Review of Systems: A complete ROS was negative except as per HPI.   Physical Exam: Blood pressure (!) 166/97, pulse 74, temperature 97.9 F (36.6 C), temperature source Oral, resp. rate 15, height 5\' 10"  (1.778 m), weight 88.5 kg, SpO2 98 %. Physical Exam Constitutional:      Appearance: He is well-developed.     Comments: Murmur noted loudest at 5th intercostal  space.  Chest pain is not reproducible with palpation  HENT:     Head: Normocephalic and atraumatic.  Eyes:     Pupils: Pupils are equal, round, and reactive to light.  Cardiovascular:     Rate and Rhythm: Normal rate and regular rhythm.     Heart sounds: Murmur heard.   Pulmonary:     Effort: Pulmonary effort is normal. No respiratory distress.     Breath sounds: Normal breath sounds.  Abdominal:     General: Bowel sounds are normal.     Palpations: Abdomen is soft.  Tenderness: There is no abdominal tenderness.  Musculoskeletal:     Cervical back: Normal range of motion.     Right lower leg: Edema present.     Left lower leg: Edema present.     Comments: 2+ pitting ot R leg to the knee. R lower is darker, per patient hx of cellulitis in this leg 2/21. R foot s/p transmetatarsal amputation  1+ pitting of the Left leg to the mid shin  5/5 BUE strength 5/5 BLE strength 5/5 Bil dorsi and plantarflexion  Skin:    General: Skin is warm and dry.  Neurological:     General: No focal deficit present.     Mental Status: He is alert and oriented to person, place, and time.  Psychiatric:        Mood and Affect: Mood normal.        Behavior: Behavior normal.     EKG: personally reviewed my interpretation is sinus rhythm with regular rate and new T wave inversions V2, V3,V6.  CXR: personally reviewed my interpretation is no bony abnormalities, good chest expansion, no abnormalites in the lung fields, gastric bubble is visible, and both costovertebral angles are visible.  Assessment & Plan by Problem: Active Problems:   Chest pain  Left sided chest pain Patient reports intermittent left sided, non- reproducible chest pain. His EKG shows new changes with T wave inversions. Per EMR patient as noted to have T wave abnomralities 08/03/2020 concerning for inferior ischemia. Patient also has hypertension with sys 140-180. Trp were stable 31>31 decreasing concern for ACS. At this time  CXR has ruled out PNA or pneumothorax. Will do PE workup however patient does not report SOB and his chest pain has been ongoing for 3 days and EKG was negative for S1Q3T3.   - Admit to Obv w/ IMTS, Dr. Rebeca Alert - Repeat EKG - Recommend patient f/u with his Cardiologist outpatient for further ischemic workup - F/u D-dimer - Continue Atorvastatin 40mg   HTN Patient history of white coat syndrome, his cardiologist recently started on amlodipine 10mg  daily. Per all of his available office notes patient was hypertensive.  Patient has been elevated sys 140-180 in the ED. - Continue Amlodipine 10mg   PAD Patient has a history of PAD. He is scheduled to have a stent placed in his L leg within the next 2 weeks. On exam today both lower extremities were well perfused. Patient likely is suffering from a ischemia and neuropathy causing his lower leg numbness and pain. This may also be causing patient to have Left side arm pain as well.  - Patient should follow up with his vascular surgery physicians outpatient, scheduled for L leg angiogram OP - Continue ASA  T2DM Patient has a history of initially poorly controlled T2DM. He has managed his DM better over the past years, but unfortunately has sequelae from his years uncontrolled. BGL at admission 117. 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 Concern that patient dry heaving and early satiety may be signs of new onset gastroparesis 2/2 T2DM. If patient is unable to tolerate PO take may consider GI consult; otherwise will recommend that patient talk with his GI doctor outpatient. - Monitor PO intake  Nausea Patient reports nausea with no emesis. Concern that patient may be developing gastroparesis vs GERD. Na and K were WNL.  Will continue to assess. - Zofran 4mg  - Heart Healthy diet - GI cocktail  ESRD MWF Patient is ESRD and compliant with HD. Last HD was Friday. Patient is noted  to have mild pitting edema of the BLE w/ R>L.  - Consult nephro  Pancytopenia Patient noted to have low WBC;s, Hgb, and Plt. Baseline WBC appears to be around 6 and Plts tend to be >300. Patient has ESRD likely causing anemia of chronic disease.  - Continue to monitor  Dispo: Admit patient to Observation with expected length of stay less than 2 midnights. Signed: Freida Busman, MD 08/05/2020, 11:42 AM  Pager: [phone] After 5pm on weekdays and 1pm on weekends: On Call pager: [phone]

## 2020-08-05 NOTE — ED Triage Notes (Signed)
Pt reports central chest pain with radiation to L jaw and L arm x 3 days. Has not been sleeping well, nauseas, and has shob x 1 week but began to feel acutely worse at dialysis on Friday. Completed whole tx.

## 2020-08-05 NOTE — ED Notes (Signed)
Lunch Tray Ordered @ [id number].

## 2020-08-05 NOTE — ED Notes (Signed)
Pt and family co poor pain control, family member over the phone yelling that this pt didn't have pain control since 3 am in the morning and requesting stat action on this, pt and family oriented this is the very first time he is complaining of pain with this RN and that I sent a message to provider as soon as I get orders for pain medication I will give it to him. [place] sandwich given as requested until he gets food from [employer].

## 2020-08-05 NOTE — ED Notes (Signed)
Tylenol given as ordered by admitting provider

## 2020-08-05 NOTE — ED Provider Notes (Addendum)
[facility] EMERGENCY DEPARTMENT Provider Note   CSN: [id number] Arrival date & time: 08/05/20  0947     History Chief Complaint  Patient presents with  . Chest Pain    [name] is a 54 y.o. male.  The history is provided by the patient.  Chest Pain Pain location:  L chest Pain quality: pressure   Pain radiates to:  L arm Pain severity:  Mild Onset quality:  Gradual Duration:  3 days Timing:  Intermittent Progression:  Waxing and waning Chronicity:  New Context: at rest   Relieved by:  Nothing Worsened by:  Exertion Associated symptoms: no abdominal pain, no back pain, no cough, no fever, no palpitations, no shortness of breath and no vomiting   Risk factors: diabetes mellitus, high cholesterol and hypertension        Past Medical History:  Diagnosis Date  . Abscess of right foot   . Acute osteomyelitis of right foot ([place]) 05/15/2019  . Anemia 06/23/2019  . Cellulitis of right foot 05/22/2019  . Chest pain 08/23/2013  . Chills    at night - sometimes  . Chronic kidney disease, stage V ([place]) 08/23/2013  . Complication of anesthesia   . Diabetes mellitus    controlled by diet  . 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 Diabetic foot ulcer- right second toe 05/14/2019  . Diabetic infection of right foot ([place])   . Diabetic retinopathy associated with type 2 diabetes mellitus ([place]) 06/23/2019  . Eczema   . ESRD on dialysis ([place]) 07/18/2011  . Essential hypertension 10/18/2007   Qualifier: Diagnosis of  By: Loanne Drilling MD, Jacelyn Pi   . Fatigue    loss of fatigue  . FOOT ULCER, LEFT 06/29/2008   Qualifier: Diagnosis of  By: Loanne Drilling MD, Sean A   . Gangrene of toe of right foot ([place])   . Headache(784.0)    Years ago  . Heart murmur    years ago  . Hemodialysis status ([place]) 08/23/2013  . History of blood transfusion   . Hyperglycemia 05/05/2015  . HYPERLIPIDEMIA 05/16/2008   Qualifier: Diagnosis of  By: Jenny Reichmann MD, Hunt Oris   . Hypertension     sees Dr. Jaci Standard  . Hypoglycemia, unspecified 11/13/2008   Qualifier: Diagnosis of  By: Jenny Reichmann MD, Hunt Oris   . MRSA (methicillin resistant staph aureus) culture positive   . MRSA INFECTION 10/18/2007   Qualifier: Diagnosis of  By: Marca Ancona RMA, Lucy    . Obesity, unspecified 08/23/2013  . Osteomyelitis of great toe of right foot ([place]) 05/14/2019  . Other forms of retinal detachment(361.89) 06/23/2019  . PONV (postoperative nausea and vomiting)   . Poor circulation   . Renal disorder   . RENAL INSUFFICIENCY 07/31/2008   Qualifier: Diagnosis of  By: Jenny Reichmann MD, Hunt Oris   . Secondary hyperparathyroidism ([place]) 06/23/2019  . Sepsis ([place]) 05/14/2019  . Type 2 diabetes mellitus ([place]) 08/23/2013  . Ulcer of right foot, with necrosis of bone ([place])   . Ulcer of right second toe with necrosis of bone [facility])     Patient Active Problem List   Diagnosis Date Noted  . Anemia 06/23/2019  . Diabetic retinopathy associated with type 2 diabetes mellitus ([place]) 06/23/2019  . Other forms of retinal detachment(361.89) 06/23/2019  . Secondary hyperparathyroidism ([place]) 06/23/2019  . Gangrene of toe of right foot ([place])   . Ulcer of right foot, with necrosis of bone ([place])   . Abscess of right foot   .  Diabetic infection of right foot ([place])   . Ulcer of right second toe with necrosis of bone ([place])   . Cellulitis of right foot 05/22/2019  . Acute osteomyelitis of right foot ([place]) 05/15/2019  . Osteomyelitis of great toe of right foot ([place]) 05/14/2019  . Diabetic foot ulcer- right second toe 05/14/2019  . Sepsis ([place]) 05/14/2019  . Hyperlipidemia 05/11/2015  . Hyperglycemia 05/05/2015  . Chest pain 08/23/2013  . Type 2 diabetes mellitus ([place]) 08/23/2013  . Hemodialysis status ([place]) 08/23/2013  . Chronic kidney disease, stage V ([place]) 08/23/2013  . Obesity, unspecified 08/23/2013  . ESRD on dialysis ([place]) 07/18/2011  . HYPOGLYCEMIA, UNSPECIFIED 11/13/2008  . RENAL INSUFFICIENCY 07/31/2008  . FOOT ULCER, LEFT  06/29/2008  . HYPERLIPIDEMIA 05/16/2008  . MRSA INFECTION 10/18/2007  . Essential hypertension 10/18/2007    Past Surgical History:  Procedure Laterality Date  . ABDOMINAL AORTOGRAM N/A 05/17/2019   Procedure: ABDOMINAL AORTOGRAM;  Surgeon: Elam Dutch, MD;  Location: [facility];  Service: Cardiovascular;  Laterality: N/A;  . AMPUTATION TOE Right 05/14/2019   Procedure: PARTIAL AMPUTATION SECOND TOE RIGHT FOOT;  Surgeon: Evelina Bucy, DPM;  Location: [facility];  Service: Podiatry;  Laterality: Right;  . AMPUTATION TOE Right 05/23/2019   Procedure: AMPUTATION OF SECOND TOE METATARSAL PHALANGEAL JOINT;  Surgeon: Evelina Bucy, DPM;  Location: [facility];  Service: Podiatry;  Laterality: Right;  . AMPUTATION TOE Right 05/27/2019   Procedure: Amputation Toe;  Surgeon: Evelina Bucy, DPM;  Location: [facility];  Service: Podiatry;  Laterality: Right;  right third toe  . APPLICATION OF WOUND VAC Right 05/27/2019   Procedure: Application Of Wound Vac;  Surgeon: Evelina Bucy, DPM;  Location: [facility];  Service: Podiatry;  Laterality: Right;  . AV FISTULA PLACEMENT  06-18-11   Right brachiocephalic AVF  . BONE BIOPSY Right 05/14/2019   Procedure: OPEN SUPERFICIAL BONE BIOPSY GREAT TOE;  Surgeon: Evelina Bucy, DPM;  Location: [facility];  Service: Podiatry;  Laterality: Right;  . EYE SURGERY     left eye for Laser, diabetic retinopathy  . FISTULOGRAM Right 11/06/2011   Procedure: FISTULOGRAM;  Surgeon: Conrad Athens, MD;  Location: [facility] CATH LAB;  Service: Cardiovascular;  Laterality: Right;  . HEMATOMA EVACUATION Right Feb. 25, 2014  . HEMATOMA EVACUATION Right 12/14/2012   Procedure: EVACUATION HEMATOMA;  Surgeon: Conrad [place], MD;  Location: [facility];  Service: Vascular;  Laterality: Right;  . INCISION AND DRAINAGE Right 05/23/2019   Procedure: INCISION AND DRAINAGE OF RIGHT FOOT DEEP SPACE ABSCESS;  Surgeon: Evelina Bucy, DPM;  Location: [facility];  Service: Podiatry;  Laterality: Right;  . INSERTION  OF DIALYSIS CATHETER  12/14/2012   Procedure: INSERTION OF DIALYSIS CATHETER;  Surgeon: Conrad Mills, MD;  Location: [facility];  Service: Vascular;;  . IRRIGATION AND DEBRIDEMENT FOOT Right 05/25/2019   Procedure: INCISION AND DRAINAGE FOOT;  Surgeon: Evelina Bucy, DPM;  Location: [facility];  Service: Podiatry;  Laterality: Right;  . IRRIGATION AND DEBRIDEMENT FOOT Right 05/27/2019   Procedure: IRRIGATION AND DEBRIDEMENT FOOT;  Surgeon: Evelina Bucy, DPM;  Location: [facility];  Service: Podiatry;  Laterality: Right;  . LOWER EXTREMITY ANGIOGRAPHY Bilateral 05/17/2019   Procedure: LOWER EXTREMITY ANGIOGRAPHY;  Surgeon: Elam Dutch, MD;  Location: [name] LAB;  Service: Cardiovascular;  Laterality: Bilateral;  . REVISON OF ARTERIOVENOUS FISTULA Right 12/14/2012   Procedure: REVISON OF upper arm ARTERIOVENOUS FISTULA using 64mx10cm gortex graft;  Surgeon: Conrad Wells, MD;  Location: [facility];  Service: Vascular;  Laterality: Right;  . SOFT TISSUE MASS EXCISION     Right arm, Left leg  for MRSA infection       Family History  Problem Relation Age of Onset  . Diabetes Mother     Social History   Tobacco Use  . Smoking status: Never Smoker  . Smokeless tobacco: Never Used  Vaping Use  . Vaping Use: Never used  Substance Use Topics  . Alcohol use: No  . Drug use: No    Home Medications Prior to Admission medications   Medication Sig Start Date End Date Taking? Authorizing Provider  aspirin EC 81 MG tablet Take 1 tablet (81 mg total) by mouth daily. 06/28/19   Wellington Hampshire, MD  atorvastatin (LIPITOR) 40 MG tablet Take 1 tablet (40 mg total) by mouth daily. 06/28/19 09/26/19  Wellington Hampshire, MD  blood glucose meter kit and supplies Dispense based on patient and insurance preference. Use up to four times daily as directed. (FOR ICD-10 E10.9, E11.9). 05/18/19   Mikhail, Velta Addison, DO  blood glucose meter kit and supplies Dispense based on patient and insurance preference. Use up to four  times daily as directed. (FOR ICD-9 250.00, 250.01). 05/18/19   Cristal Ford, DO  Continuous Blood Gluc Sensor (FREESTYLE LIBRE 14 DAY SENSOR) MISC See admin instructions. 06/20/19   [provider]  ferric citrate (AURYXIA) 1 GM 210 MG(Fe) tablet Take 210-630 mg by mouth See admin instructions. Take 3 tablets (630 mg) by mouth up to three times daily with meals, take 1 tablet (210 mg) with snacks    [provider]  glucose blood (ACCU-CHEK AVIVA PLUS) test strip Use as instructed 05/18/19   Cristal Ford, DO  HYDROcodone-acetaminophen (NORCO) 10-325 MG tablet Take 1 tablet by mouth every 8 (eight) hours as needed (pain). 06/16/19   Evelina Bucy, DPM  Lancet Devices (ACCU-CHEK [facility]) lancets Use as instructed 05/18/19   Cristal Ford, DO  Lanthanum Carbonate (FOSRENOL) 1000 MG PACK Take 1,000 mg by mouth 3 (three) times daily with meals.     [provider]  Melatonin 5 MG CAPS Take 10 mg by mouth at bedtime as needed (sleep).    [provider]  mupirocin nasal ointment (BACTROBAN) 2 % Apply small amount to lip 1 - 2 x a day 11/06/19   Raylene Everts, MD  OVER THE COUNTER MEDICATION Take 1 Bottle by mouth 2 (two) times a day. Vegan Protein Shake    [provider]  OVER THE COUNTER MEDICATION Take 1 tablet by mouth daily. ZYMESSEC (cholesterol reducing enzyme)    [provider]  loratadine (CLARITIN) 10 MG tablet Take 10 mg by mouth daily as needed for allergies.  11/06/19  [provider]    Allergies    Amoxicillin, Hydromorphone, and Percocet [oxycodone-acetaminophen]  Review of Systems   Review of Systems  Constitutional: Negative for chills and fever.  HENT: Negative for ear pain and sore throat.   Eyes: Negative for pain and visual disturbance.  Respiratory: Negative for cough and shortness of breath.   Cardiovascular: Positive for chest pain. Negative for palpitations.  Gastrointestinal: Negative for  abdominal pain and vomiting.  Genitourinary: Negative for dysuria and hematuria.  Musculoskeletal: Negative for arthralgias and back pain.  Skin: Negative for color change and rash.  Neurological: Negative for seizures and syncope.  All other systems reviewed and are negative.   Physical Exam  Updated Vital Signs  ED Triage Vitals  Enc Vitals Group     BP 08/05/20 0535 (!) 168/93     Pulse Rate 08/05/20 0535 79     Resp 08/05/20 0535 14     Temp 08/05/20 0535 97.9 F (36.6 C)     Temp Source 08/05/20 0535 Oral     SpO2 08/05/20 0535 100 %     Weight 08/05/20 0845 195 lb (88.5 kg)     Height 08/05/20 0845 '5\' 10"'  (1.778 m)     Head Circumference --      Peak Flow --      Pain Score 08/05/20 0536 7     Pain Loc --      Pain Edu? --      Excl. in [place]? --     Physical Exam Vitals and nursing note reviewed.  Constitutional:      General: He is not in acute distress.    Appearance: He is well-developed. He is not ill-appearing.  HENT:     Head: Normocephalic and atraumatic.  Eyes:     Conjunctiva/sclera: Conjunctivae normal.  Cardiovascular:     Rate and Rhythm: Normal rate and regular rhythm.     Pulses:          Radial pulses are 2+ on the right side and 2+ on the left side.     Heart sounds: Normal heart sounds. No murmur heard.   Pulmonary:     Effort: Pulmonary effort is normal. No respiratory distress.     Breath sounds: Normal breath sounds. No decreased breath sounds.  Abdominal:     Palpations: Abdomen is soft.     Tenderness: There is no abdominal tenderness.  Musculoskeletal:        General: Normal range of motion.     Cervical back: Neck supple.     Right lower leg: Edema (trace) present.     Left lower leg: Edema (trace) present.  Skin:    General: Skin is warm and dry.     Capillary Refill: Capillary refill takes less than 2 seconds.  Neurological:     General: No focal deficit present.     Mental Status: He is alert.  Psychiatric:        Mood and  Affect: Mood normal.     ED Results / Procedures / Treatments   Labs (all labs ordered are listed, but only abnormal results are displayed) Labs Reviewed  BASIC METABOLIC PANEL - Abnormal; Notable for the following components:      Result Value   Chloride 97 (*)    Glucose, Bld 117 (*)    BUN 68 (*)    Creatinine, Ser 9.92 (*)    GFR, Estimated 5 (*)    All other components within normal limits  CBC - Abnormal; Notable for the following components:   WBC 3.2 (*)    RBC 3.71 (*)    Hemoglobin 10.3 (*)    HCT 33.4 (*)    RDW 21.5 (*)    Platelets 99 (*)    All other components within normal limits  TROPONIN I (HIGH SENSITIVITY) - Abnormal; Notable for the following components:   Troponin I (High Sensitivity) 31 (*)    All other components within normal limits  RESPIRATORY PANEL BY RT PCR (FLU A&B, COVID)  TROPONIN I (HIGH SENSITIVITY)    EKG EKG Interpretation  Date/Time:  Sunday August 05 2020 05:39:37 EDT Ventricular Rate:  78 PR Interval:  140 QRS  Duration: 84 QT Interval:  430 QTC Calculation: 490 R Axis:   21 Text Interpretation: Unusual P axis, possible ectopic atrial rhythm T wave abnormality, consider inferior ischemia Prolonged QT Abnormal ECG Reconfirmed by Lennice Sites [id number]) on 08/05/2020 8:55:16 AM   Radiology DG Chest 2 View  Result Date: 08/05/2020 CLINICAL DATA:  Chest pain EXAM: CHEST - 2 VIEW COMPARISON:  08/05/2019 FINDINGS: Lungs are clear.  No pleural effusion or pneumothorax. The heart is normal in size. Visualized osseous structures are within normal limits. IMPRESSION: Normal chest radiographs. Electronically Signed   By: Julian Hy M.D.   On: 08/05/2020 06:10    Procedures Procedures (including critical care time)  Medications Ordered in ED Medications  nitroGLYCERIN (NITROSTAT) SL tablet 0.4 mg (has no administration in time range)    ED Course  I have reviewed the triage vital signs and the nursing notes.  Pertinent  labs & imaging results that were available during my care of the patient were reviewed by me and considered in my medical decision making (see chart for details).    MDM Rules/Calculators/A&P                          [name] a 54 year old male with history of end-stage renal disease on hemodialysis, high cholesterol, hypertension, diabetes who presents to the ED with chest pain.  EKG shows T wave inversions inferiorly and laterally that appear new from prior EKGs.  Having intermittent chest pain for the last 3 days possibly exertional radiates to the left arm.  Multiple cardiac risk factors.  Does not appear to have had an ischemic cardiac work-up recently.  Denies any major shortness of breath.  No signs of major volume overload on exam.  Patient did have full dialysis on Friday.  Electrolytes overall her unremarkable and at baseline.  Troponin mildly elevated at 31.  Chest x-ray with no signs of pneumonia, no pneumothorax.  Talked with cardiology given concerning EKG.  They recommend medicine admission for further ACS rule out.  Will get repeat troponin and have patient admitted to medicine.  Pain at this time is about a 1 or 2.  Will order nitroglycerin as needed.  This chart was dictated using voice recognition software.  Despite best efforts to proofread,  errors can occur which can change the documentation meaning.    Final Clinical Impression(s) / ED Diagnoses Final diagnoses:  Chest pain, unspecified type    Rx / DC Orders ED Discharge Orders    None       Lennice Sites, DO 08/05/20 [place], [place], DO 08/05/20 [phone]

## 2020-08-06 ENCOUNTER — Encounter (HOSPITAL_COMMUNITY): Payer: Self-pay | Admitting: Internal Medicine

## 2020-08-06 DIAGNOSIS — I25110 Atherosclerotic heart disease of native coronary artery with unstable angina pectoris: Secondary | ICD-10-CM | POA: Diagnosis not present

## 2020-08-06 DIAGNOSIS — Z20822 Contact with and (suspected) exposure to covid-19: Secondary | ICD-10-CM | POA: Diagnosis not present

## 2020-08-06 DIAGNOSIS — N2581 Secondary hyperparathyroidism of renal origin: Secondary | ICD-10-CM | POA: Diagnosis not present

## 2020-08-06 DIAGNOSIS — D61818 Other pancytopenia: Secondary | ICD-10-CM | POA: Diagnosis not present

## 2020-08-06 DIAGNOSIS — I120 Hypertensive chronic kidney disease with stage 5 chronic kidney disease or end stage renal disease: Secondary | ICD-10-CM | POA: Diagnosis not present

## 2020-08-06 DIAGNOSIS — Z992 Dependence on renal dialysis: Secondary | ICD-10-CM | POA: Diagnosis not present

## 2020-08-06 DIAGNOSIS — R600 Localized edema: Secondary | ICD-10-CM | POA: Diagnosis not present

## 2020-08-06 DIAGNOSIS — E1151 Type 2 diabetes mellitus with diabetic peripheral angiopathy without gangrene: Secondary | ICD-10-CM | POA: Diagnosis not present

## 2020-08-06 DIAGNOSIS — E8770 Fluid overload, unspecified: Secondary | ICD-10-CM | POA: Diagnosis not present

## 2020-08-06 DIAGNOSIS — R110 Nausea: Secondary | ICD-10-CM | POA: Diagnosis not present

## 2020-08-06 DIAGNOSIS — L309 Dermatitis, unspecified: Secondary | ICD-10-CM | POA: Diagnosis not present

## 2020-08-06 DIAGNOSIS — R079 Chest pain, unspecified: Secondary | ICD-10-CM

## 2020-08-06 DIAGNOSIS — E7800 Pure hypercholesterolemia, unspecified: Secondary | ICD-10-CM | POA: Diagnosis not present

## 2020-08-06 DIAGNOSIS — N186 End stage renal disease: Secondary | ICD-10-CM | POA: Diagnosis not present

## 2020-08-06 DIAGNOSIS — D631 Anemia in chronic kidney disease: Secondary | ICD-10-CM | POA: Diagnosis not present

## 2020-08-06 DIAGNOSIS — E11319 Type 2 diabetes mellitus with unspecified diabetic retinopathy without macular edema: Secondary | ICD-10-CM | POA: Diagnosis not present

## 2020-08-06 DIAGNOSIS — E1122 Type 2 diabetes mellitus with diabetic chronic kidney disease: Secondary | ICD-10-CM | POA: Diagnosis not present

## 2020-08-06 DIAGNOSIS — E785 Hyperlipidemia, unspecified: Secondary | ICD-10-CM | POA: Diagnosis not present

## 2020-08-06 LAB — CBC WITH DIFFERENTIAL/PLATELET
Abs Immature Granulocytes: 0 10*3/uL (ref 0.00–0.07)
Basophils Absolute: 0.1 10*3/uL (ref 0.0–0.1)
Basophils Relative: 1 %
Eosinophils Absolute: 0.2 10*3/uL (ref 0.0–0.5)
Eosinophils Relative: 5 %
HCT: 31.9 % — ABNORMAL LOW (ref 39.0–52.0)
Hemoglobin: 10.2 g/dL — ABNORMAL LOW (ref 13.0–17.0)
Immature Granulocytes: 0 %
Lymphocytes Relative: 15 %
Lymphs Abs: 0.5 10*3/uL — ABNORMAL LOW (ref 0.7–4.0)
MCH: 28 pg (ref 26.0–34.0)
MCHC: 32 g/dL (ref 30.0–36.0)
MCV: 87.6 fL (ref 80.0–100.0)
Monocytes Absolute: 0.3 10*3/uL (ref 0.1–1.0)
Monocytes Relative: 9 %
Neutro Abs: 2.6 10*3/uL (ref 1.7–7.7)
Neutrophils Relative %: 70 %
Platelets: 96 10*3/uL — ABNORMAL LOW (ref 150–400)
RBC: 3.64 MIL/uL — ABNORMAL LOW (ref 4.22–5.81)
RDW: 21.2 % — ABNORMAL HIGH (ref 11.5–15.5)
WBC: 3.7 10*3/uL — ABNORMAL LOW (ref 4.0–10.5)
nRBC: 0 % (ref 0.0–0.2)

## 2020-08-06 LAB — RENAL FUNCTION PANEL
Albumin: 3.4 g/dL — ABNORMAL LOW (ref 3.5–5.0)
Anion gap: 19 — ABNORMAL HIGH (ref 5–15)
BUN: 87 mg/dL — ABNORMAL HIGH (ref 6–20)
CO2: 21 mmol/L — ABNORMAL LOW (ref 22–32)
Calcium: 9.2 mg/dL (ref 8.9–10.3)
Chloride: 100 mmol/L (ref 98–111)
Creatinine, Ser: 11.2 mg/dL — ABNORMAL HIGH (ref 0.61–1.24)
GFR, Estimated: 5 mL/min — ABNORMAL LOW (ref >60)
Glucose, Bld: 115 mg/dL — ABNORMAL HIGH (ref 70–99)
Phosphorus: 10.9 mg/dL — ABNORMAL HIGH (ref 2.5–4.6)
Potassium: 5.5 mmol/L — ABNORMAL HIGH (ref 3.5–5.1)
Sodium: 140 mmol/L (ref 135–145)

## 2020-08-06 LAB — GLUCOSE, CAPILLARY
Glucose-Capillary: 112 mg/dL — ABNORMAL HIGH (ref 70–99)
Glucose-Capillary: 143 mg/dL — ABNORMAL HIGH (ref 70–99)
Glucose-Capillary: 106 mg/dL — ABNORMAL HIGH (ref 70–99)

## 2020-08-06 LAB — HEPATITIS B SURFACE ANTIGEN: Hepatitis B Surface Ag: NONREACTIVE

## 2020-08-06 LAB — HEMOGLOBIN A1C
Hgb A1c MFr Bld: 6.2 % — ABNORMAL HIGH (ref 4.8–5.6)
Mean Plasma Glucose: 131 mg/dL

## 2020-08-06 LAB — LACTATE DEHYDROGENASE: LDH: 167 U/L (ref 98–192)

## 2020-08-06 LAB — RETICULOCYTES
Immature Retic Fract: 5.9 % (ref 2.3–15.9)
RBC.: 3.63 MIL/uL — ABNORMAL LOW (ref 4.22–5.81)
Retic Count, Absolute: 40.7 10*3/uL (ref 19.0–186.0)
Retic Ct Pct: 1.1 % (ref 0.4–3.1)

## 2020-08-06 LAB — SAVE SMEAR(SSMR), FOR PROVIDER SLIDE REVIEW

## 2020-08-06 MED ORDER — NITROGLYCERIN 0.4 MG SL SUBL: 0.4000 mg | SUBLINGUAL_TABLET | SUBLINGUAL | Status: DC | PRN

## 2020-08-06 MED ORDER — ASPIRIN 81 MG PO CHEW
81.0000 mg | CHEWABLE_TABLET | ORAL | Status: AC
  Administered 2020-08-07: 81 mg via ORAL
  Filled 2020-08-06: qty 1

## 2020-08-06 MED ORDER — FERRIC CITRATE 1 GM 210 MG(FE) PO TABS
630.0000 mg | ORAL_TABLET | Freq: Three times a day (TID) | ORAL | Status: DC
  Administered 2020-08-07 – 2020-08-09 (×2): 630 mg via ORAL
  Filled 2020-08-06 (×10): qty 3

## 2020-08-06 MED ORDER — SODIUM CHLORIDE 0.9 % IV SOLN: INTRAVENOUS | Status: DC

## 2020-08-06 MED ORDER — SODIUM CHLORIDE 0.9 % IV SOLN: 250.0000 mL | INTRAVENOUS | Status: DC | PRN

## 2020-08-06 MED ORDER — SODIUM CHLORIDE 0.9% FLUSH
3.0000 mL | Freq: Two times a day (BID) | INTRAVENOUS | Status: DC
  Administered 2020-08-07 (×2): 3 mL via INTRAVENOUS

## 2020-08-06 MED ORDER — ACETAMINOPHEN 325 MG PO TABS: 325.0000 mg | ORAL_TABLET | Freq: Once | ORAL | Status: DC

## 2020-08-06 MED ORDER — FAMOTIDINE 20 MG PO TABS
10.0000 mg | ORAL_TABLET | ORAL | Status: DC
  Administered 2020-08-08: 10 mg via ORAL
  Filled 2020-08-06: qty 1

## 2020-08-06 MED ORDER — HEPARIN SODIUM (PORCINE) 1000 UNIT/ML DIALYSIS: 2000.0000 [IU] | Freq: Once | INTRAMUSCULAR | Status: DC

## 2020-08-06 MED ORDER — CHLORHEXIDINE GLUCONATE CLOTH 2 % EX PADS: 6.0000 | MEDICATED_PAD | Freq: Every day | CUTANEOUS | Status: DC

## 2020-08-06 MED ORDER — DARBEPOETIN ALFA 100 MCG/0.5ML IJ SOSY
100.0000 ug | PREFILLED_SYRINGE | INTRAMUSCULAR | Status: DC
  Administered 2020-08-06: 100 ug via INTRAVENOUS
  Filled 2020-08-06: qty 0.5

## 2020-08-06 MED ORDER — ACETAMINOPHEN-CODEINE #3 300-30 MG PO TABS
1.0000 | ORAL_TABLET | Freq: Every day | ORAL | Status: DC | PRN
  Administered 2020-08-06: 1 via ORAL
  Filled 2020-08-06: qty 1

## 2020-08-06 MED ORDER — DOXERCALCIFEROL 4 MCG/2ML IV SOLN
6.0000 ug | INTRAVENOUS | Status: DC
  Filled 2020-08-06: qty 4

## 2020-08-06 MED ORDER — ACETAMINOPHEN-CODEINE #3 300-30 MG PO TABS
1.0000 | ORAL_TABLET | Freq: Once | ORAL | Status: AC
  Administered 2020-08-07: 1 via ORAL
  Filled 2020-08-06 (×2): qty 1

## 2020-08-06 MED ORDER — SODIUM CHLORIDE 0.9% FLUSH: 3.0000 mL | INTRAVENOUS | Status: DC | PRN

## 2020-08-06 NOTE — Consult Note (Signed)
Renal Service Consult Note [name]  [name] 08/06/2020 [name], MD Requesting Physician:  Dr Rebeca Alert, A.   Reason for Consult:  ESRD pt w/ chest pain episode HPI: The patient is a 54 y.o. year-old w/ hx of DM2, ESRD on HD, HTN, HL, PAD w/ right foot toe amp(s) presented to ED on 10/17 w/ chest pain that started in R shoulder and spread to the L. In ED CTA was neg for PE. Trops were only slightly up. Pt continued to have chest pains over the weekend. ECHO showed restrictive filling pattern (G3DD) w/ mod TR. Cardiology seeing patient. Pt having no pain now. Asked to see for dialysis.   Pt states he came off at 89kg on Friday, doesn't feel the scales are right here at 93.5 kg.  Has lost a lot of body wt down from 300 lbs to 200 lbs approx.  Had a bad episode of feeling like "coding, or passing out", on HD Friday, they were trying to pull "about 5kg". States he used to gain 9kg on weekends and now tries to keep wt gain on the w/e at 5kg, states he was told "2kg per day" fluid gain is okay.  I told him actually it's 1kg per day recommende.d     ROS  denies CP  no joint pain   no HA  no blurry vision  no rash  no diarrhea  no nausea/ vomiting     Past Medical History  Past Medical History:  Diagnosis Date  . Abscess of right foot   . Acute osteomyelitis of right foot ([place]) 05/15/2019  . Anemia 06/23/2019  . Cellulitis of right foot 05/22/2019  . Chest pain 08/23/2013  . Chills    at night - sometimes  . Chronic kidney disease, stage V ([place]) 08/23/2013  . Complication of anesthesia   . Diabetes mellitus    controlled by diet  . 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 Diabetic foot ulcer- right second toe 05/14/2019  . Diabetic infection of right foot ([place])   . Diabetic retinopathy associated with type 2 diabetes mellitus ([place]) 06/23/2019  . Eczema   . ESRD on dialysis ([place]) 07/18/2011  . Essential hypertension 10/18/2007   Qualifier:  Diagnosis of  By: Loanne Drilling MD, Jacelyn Pi   . Fatigue    loss of fatigue  . FOOT ULCER, LEFT 06/29/2008   Qualifier: Diagnosis of  By: Loanne Drilling MD, Sean A   . Gangrene of toe of right foot ([place])   . Headache(784.0)    Years ago  . Heart murmur    years ago  . Hemodialysis status ([place]) 08/23/2013  . History of blood transfusion   . Hyperglycemia 05/05/2015  . HYPERLIPIDEMIA 05/16/2008   Qualifier: Diagnosis of  By: Jenny Reichmann MD, Hunt Oris   . Hypertension    sees Dr. Jaci Standard  . Hypoglycemia, unspecified 11/13/2008   Qualifier: Diagnosis of  By: Jenny Reichmann MD, Hunt Oris   . MRSA (methicillin resistant staph aureus) culture positive   . MRSA INFECTION 10/18/2007   Qualifier: Diagnosis of  By: Marca Ancona RMA, Lucy    . Obesity, unspecified 08/23/2013  . Osteomyelitis of great toe of right foot ([place]) 05/14/2019  . Other forms of retinal detachment(361.89) 06/23/2019  . PONV (postoperative nausea and vomiting)   . Poor circulation   . Renal disorder   . RENAL INSUFFICIENCY 07/31/2008   Qualifier: Diagnosis of  By: Jenny Reichmann MD, Hunt Oris   . Secondary hyperparathyroidism ([place]) 06/23/2019  .  Sepsis ([place]) 05/14/2019  . Type 2 diabetes mellitus ([place]) 08/23/2013  . Ulcer of right foot, with necrosis of bone ([place])   . Ulcer of right second toe with necrosis of bone [facility])    Past Surgical History  Past Surgical History:  Procedure Laterality Date  . ABDOMINAL AORTOGRAM N/A 05/17/2019   Procedure: ABDOMINAL AORTOGRAM;  Surgeon: Elam Dutch, MD;  Location: [place] CV [facility];  Service: Cardiovascular;  Laterality: N/A;  . AMPUTATION TOE Right 05/14/2019   Procedure: PARTIAL AMPUTATION SECOND TOE RIGHT FOOT;  Surgeon: Evelina Bucy, DPM;  Location: [place];  Service: Podiatry;  Laterality: Right;  . AMPUTATION TOE Right 05/23/2019   Procedure: AMPUTATION OF SECOND TOE METATARSAL PHALANGEAL JOINT;  Surgeon: Evelina Bucy, DPM;  Location: [place];  Service: Podiatry;  Laterality: Right;  . AMPUTATION TOE Right 05/27/2019    Procedure: Amputation Toe;  Surgeon: Evelina Bucy, DPM;  Location: [place];  Service: Podiatry;  Laterality: Right;  right third toe  . APPLICATION OF WOUND VAC Right 05/27/2019   Procedure: Application Of Wound Vac;  Surgeon: Evelina Bucy, DPM;  Location: [place];  Service: Podiatry;  Laterality: Right;  . AV FISTULA PLACEMENT  06-18-11   Right brachiocephalic AVF  . BONE BIOPSY Right 05/14/2019   Procedure: OPEN SUPERFICIAL BONE BIOPSY GREAT TOE;  Surgeon: Evelina Bucy, DPM;  Location: [place];  Service: Podiatry;  Laterality: Right;  . EYE SURGERY     left eye for Laser, diabetic retinopathy  . FISTULOGRAM Right 11/06/2011   Procedure: FISTULOGRAM;  Surgeon: Conrad [place]olah, MD;  Location: [facility] CATH LAB;  Service: Cardiovascular;  Laterality: Right;  . HEMATOMA EVACUATION Right Feb. 25, 2014  . HEMATOMA EVACUATION Right 12/14/2012   Procedure: EVACUATION HEMATOMA;  Surgeon: Conrad [place], MD;  Location: [place];  Service: Vascular;  Laterality: Right;  . INCISION AND DRAINAGE Right 05/23/2019   Procedure: INCISION AND DRAINAGE OF RIGHT FOOT DEEP SPACE ABSCESS;  Surgeon: Evelina Bucy, DPM;  Location: [place];  Service: Podiatry;  Laterality: Right;  . INSERTION OF DIALYSIS CATHETER  12/14/2012   Procedure: INSERTION OF DIALYSIS CATHETER;  Surgeon: Conrad [place], MD;  Location: [place];  Service: Vascular;;  . IRRIGATION AND DEBRIDEMENT FOOT Right 05/25/2019   Procedure: INCISION AND DRAINAGE FOOT;  Surgeon: Evelina Bucy, DPM;  Location: [place];  Service: Podiatry;  Laterality: Right;  . IRRIGATION AND DEBRIDEMENT FOOT Right 05/27/2019   Procedure: IRRIGATION AND DEBRIDEMENT FOOT;  Surgeon: Evelina Bucy, DPM;  Location: [place];  Service: Podiatry;  Laterality: Right;  . LOWER EXTREMITY ANGIOGRAPHY Bilateral 05/17/2019   Procedure: LOWER EXTREMITY ANGIOGRAPHY;  Surgeon: Elam Dutch, MD;  Location: [place] CV [facility];  Service: Cardiovascular;  Laterality: Bilateral;  . REVISON OF ARTERIOVENOUS  FISTULA Right 12/14/2012   Procedure: REVISON OF upper arm ARTERIOVENOUS FISTULA using 64mmx10cm gortex graft;  Surgeon: Conrad Volta, MD;  Location: [place];  Service: Vascular;  Laterality: Right;  . SOFT TISSUE MASS EXCISION     Right arm, Left leg  for MRSA infection   Family History  Family History  Problem Relation Age of Onset  . Diabetes Mother    Social History  reports that he has never smoked. He has never used smokeless tobacco. He reports that he does not drink alcohol and does not use drugs. Allergies  Allergies  Allergen Reactions  . Amoxicillin Itching    Did it involve swelling of  the face/tongue/throat, SOB, or low BP? Unknown Did it involve sudden or severe rash/hives, skin peeling, or any reaction on the inside of your mouth or nose? Unknown Did you need to seek medical attention at a hospital or doctor's office? Unknown When did it last happen?20 years ago If all above answers are "NO", may proceed with cephalosporin use.   Marland Kitchen Hydromorphone Itching    Patient states he may take with benadryl. Makes feet itch.  Marland Kitchen Percocet [Oxycodone-Acetaminophen] Itching and Other (See Comments)    Legs only. Itching and burning feeling.  . Gabapentin Nausea And Vomiting    Stomach issues    Home medications Prior to Admission medications   Medication Sig Start Date End Date Taking? Authorizing Provider  amLODipine (NORVASC) 10 MG tablet Take 10 mg by mouth at bedtime.  08/03/20  Yes [provider]  aspirin EC 81 MG tablet Take 1 tablet (81 mg total) by mouth daily. 06/28/19  Yes Wellington Hampshire, MD  atorvastatin (LIPITOR) 40 MG tablet Take 1 tablet (40 mg total) by mouth daily. 06/28/19 08/05/20 Yes Wellington Hampshire, MD  blood glucose meter kit and supplies Dispense based on patient and insurance preference. Use up to four times daily as directed. (FOR ICD-10 E10.9, E11.9). 05/18/19  Yes Mikhail, [place], DO  blood glucose meter kit and supplies Dispense based on  patient and insurance preference. Use up to four times daily as directed. (FOR ICD-9 250.00, 250.01). 05/18/19  Yes Mikhail, [place], DO  Continuous Blood Gluc Sensor (FREESTYLE LIBRE 14 DAY SENSOR) MISC See admin instructions. 06/20/19  Yes [provider]  ferric citrate (AURYXIA) 1 GM 210 MG(Fe) tablet Take 210-630 mg by mouth See admin instructions. Take 3 tablets (630 mg) by mouth up to three times daily with meals, take 1 tablet (210 mg) with snacks   Yes [provider]  glucose blood (ACCU-CHEK AVIVA PLUS) test strip Use as instructed 05/18/19  Yes Mikhail, [place], DO  Lancet Devices ([place]) lancets Use as instructed 05/18/19  Yes Mikhail, [place], DO  Melatonin 5 MG CAPS Take 10 mg by mouth at bedtime as needed (sleep).   Yes [provider]  sevelamer carbonate (RENVELA) 800 MG tablet Take 800 mg by mouth 3 (three) times daily with meals. Just takes with meals 07/25/20  Yes [provider]  HYDROcodone-acetaminophen (NORCO) 10-325 MG tablet Take 1 tablet by mouth every 8 (eight) hours as needed (pain). Patient not taking: Reported on 08/05/2020 06/16/19   Evelina Bucy, DPM  mupirocin nasal ointment (BACTROBAN) 2 % Apply small amount to lip 1 - 2 x a day 11/06/19   Raylene Everts, MD  loratadine (CLARITIN) 10 MG tablet Take 10 mg by mouth daily as needed for allergies.  11/06/19  [provider]     Vitals:   08/05/20 2004 08/06/20 0325 08/06/20 0852 08/06/20 1210  BP: (!) 167/88 (!) 176/92 (!) 171/97 (!) 171/96  Pulse: 71 71 99 70  Resp: _0 Temp: 98.2 F (36.8 C) 97.9 F (36.6 C) 98.3 F (36.8 C) 98.3 F (36.8 C)  TempSrc: Oral Oral Oral Oral  SpO2: 95% 100% 99% 98%  Weight:  93.9 kg    Height:       Exam Gen alert, no distress, calm No rash, cyanosis or gangrene Sclera anicteric, throat clear  No jvd or bruits Chest clear bilat to bases, no rales or wheezing RRR no MRG Abd soft ntnd no mass or ascites  +  bs GU normal male MS no joint effusions, R TMA mild pedal edema bilat Ext no wounds or ulcers Neuro is alert, Ox 3 , nf RUA AVF +bruit   Home meds:  - norvasc 10/ asa 81/ lipitor 40  - norco prn qid  - auryxia 3 tabs ac tid / renvela 800 tid ac    OP HD: [place] MWF   4h  450/ 800  88.5kg  2/2.25 bath  Hep 2000   RUA AVF  - hect 6 ug  - mircera 150ug q 2 wks, last 10/4, due today   Assessment/ Plan: 1. Chest pain - cardiology evaluating. Trop's slightly ^. CTA chest negative.  CXR neg.  2. ESRD - on HD since 2012.  HD this evening.  3. HTN/volume - up 4kg by wts, will recheck pre HD tonight. Max UF 2.5 tonight given chest pains. Cont norvasc.  4. MBD ckd - cont binders x 2, hect  5. Anemia ckd - due for esa today, wrote for darbe 100 q wk give w/ hd tonight 6. PAD - hx R toe amps      Kelly Splinter  MD 08/06/2020, 3:21 PM  Recent Labs  Lab 08/05/20 0547 08/06/20 0320  WBC 3.2* 3.7*  HGB 10.3* 10.2*   Recent Labs  Lab 08/05/20 1437 08/06/20 0320  K 5.9* 5.5*  BUN 73* 87*  CREATININE 10.58* 11.20*  CALCIUM 9.1 9.2  PHOS  --  10.9*

## 2020-08-06 NOTE — Progress Notes (Deleted)
Cardiology Office Note    Date:  08/06/2020   ID:  [name], DOB 04-30-1966, MRN [id number]  PCP:  [name] Hampshire, MD  Cardiologist: Candee Furbish, MD EPS: None  No chief complaint on file.   History of Present Illness:  [name] is a 54 y.o. male with history of hypertension, PAD, DM type II, ESRD on HD Patient initially placed on my schedule for cardiac clearance before undergoing lower extremity angiography 08/13/2020 but was seen at [facility] cardiology 08/02/2020 for similar clearance and echo was done.  This showed normal LV function with mild LVH grade 3 DD, moderate TR he then went to [facility] ER 08/05/2020 with chest pain felt to be atypical but relieved with Tylenol.  Troponins were not checked.  CTA was negative for PE.  Discussed with Dr. Harrington Challenger who said he could have an outpatient ischemic work-up.  Past Medical History:  Diagnosis Date  . Abscess of right foot   . Acute osteomyelitis of right foot ([name]) 05/15/2019  . Anemia 06/23/2019  . Cellulitis of right foot 05/22/2019  . Chest pain 08/23/2013  . Chills    at night - sometimes  . Chronic kidney disease, stage V ([name]) 08/23/2013  . Complication of anesthesia   . Diabetes mellitus    controlled by diet  . 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 Diabetic foot ulcer- right second toe 05/14/2019  . Diabetic infection of right foot ([name])   . Diabetic retinopathy associated with type 2 diabetes mellitus ([place]) 06/23/2019  . Eczema   . ESRD on dialysis ([name]) 07/18/2011  . Essential hypertension 10/18/2007   Qualifier: Diagnosis of  By: Loanne Drilling MD, Jacelyn Pi   . Fatigue    loss of fatigue  . FOOT ULCER, LEFT 06/29/2008   Qualifier: Diagnosis of  By: Loanne Drilling MD, Sean A   . Gangrene of toe of right foot ([name])   . Headache(784.0)    Years ago  . Heart murmur    years ago  . Hemodialysis status ([place]) 08/23/2013  . History of blood transfusion   . Hyperglycemia 05/05/2015  . HYPERLIPIDEMIA 05/16/2008   Qualifier:  Diagnosis of  By: Jenny Reichmann MD, Hunt Oris   . Hypertension    sees Dr. Jaci Standard  . Hypoglycemia, unspecified 11/13/2008   Qualifier: Diagnosis of  By: Jenny Reichmann MD, Hunt Oris   . MRSA (methicillin resistant staph aureus) culture positive   . MRSA INFECTION 10/18/2007   Qualifier: Diagnosis of  By: Marca Ancona RMA, Lucy    . Obesity, unspecified 08/23/2013  . Osteomyelitis of great toe of right foot ([name]) 05/14/2019  . Other forms of retinal detachment(361.89) 06/23/2019  . PONV (postoperative nausea and vomiting)   . Poor circulation   . Renal disorder   . RENAL INSUFFICIENCY 07/31/2008   Qualifier: Diagnosis of  By: Jenny Reichmann MD, Hunt Oris   . Secondary hyperparathyroidism ([name]) 06/23/2019  . Sepsis ([name]) 05/14/2019  . Type 2 diabetes mellitus ([name]) 08/23/2013  . Ulcer of right foot, with necrosis of bone ([name])   . Ulcer of right second toe with necrosis of bone [facility])     Past Surgical History:  Procedure Laterality Date  . ABDOMINAL AORTOGRAM N/A 05/17/2019   Procedure: ABDOMINAL AORTOGRAM;  Surgeon: Elam Dutch, MD;  Location: [place] [facility];  Service: Cardiovascular;  Laterality: N/A;  . AMPUTATION TOE Right 05/14/2019   Procedure: PARTIAL AMPUTATION SECOND TOE RIGHT FOOT;  Surgeon: Evelina Bucy, DPM;  Location: [place];  Service: Podiatry;  Laterality: Right;  . AMPUTATION TOE Right 05/23/2019   Procedure: AMPUTATION OF SECOND TOE METATARSAL PHALANGEAL JOINT;  Surgeon: Evelina Bucy, DPM;  Location: [place];  Service: Podiatry;  Laterality: Right;  . AMPUTATION TOE Right 05/27/2019   Procedure: Amputation Toe;  Surgeon: Evelina Bucy, DPM;  Location: [place];  Service: Podiatry;  Laterality: Right;  right third toe  . APPLICATION OF WOUND VAC Right 05/27/2019   Procedure: Application Of Wound Vac;  Surgeon: Evelina Bucy, DPM;  Location: [place];  Service: Podiatry;  Laterality: Right;  . AV FISTULA PLACEMENT  06-18-11   Right brachiocephalic AVF  . BONE BIOPSY Right 05/14/2019   Procedure: OPEN  SUPERFICIAL BONE BIOPSY GREAT TOE;  Surgeon: Evelina Bucy, DPM;  Location: [place];  Service: Podiatry;  Laterality: Right;  . EYE SURGERY     left eye for Laser, diabetic retinopathy  . FISTULOGRAM Right 11/06/2011   Procedure: FISTULOGRAM;  Surgeon: Conrad Gobles, MD;  Location: [facility] CATH LAB;  Service: Cardiovascular;  Laterality: Right;  . HEMATOMA EVACUATION Right Feb. 25, 2014  . HEMATOMA EVACUATION Right 12/14/2012   Procedure: EVACUATION HEMATOMA;  Surgeon: Conrad Tabernash, MD;  Location: [place];  Service: Vascular;  Laterality: Right;  . INCISION AND DRAINAGE Right 05/23/2019   Procedure: INCISION AND DRAINAGE OF RIGHT FOOT DEEP SPACE ABSCESS;  Surgeon: Evelina Bucy, DPM;  Location: [place];  Service: Podiatry;  Laterality: Right;  . INSERTION OF DIALYSIS CATHETER  12/14/2012   Procedure: INSERTION OF DIALYSIS CATHETER;  Surgeon: Conrad Catarina, MD;  Location: [place];  Service: Vascular;;  . IRRIGATION AND DEBRIDEMENT FOOT Right 05/25/2019   Procedure: INCISION AND DRAINAGE FOOT;  Surgeon: Evelina Bucy, DPM;  Location: [place];  Service: Podiatry;  Laterality: Right;  . IRRIGATION AND DEBRIDEMENT FOOT Right 05/27/2019   Procedure: IRRIGATION AND DEBRIDEMENT FOOT;  Surgeon: Evelina Bucy, DPM;  Location: [place];  Service: Podiatry;  Laterality: Right;  . LOWER EXTREMITY ANGIOGRAPHY Bilateral 05/17/2019   Procedure: LOWER EXTREMITY ANGIOGRAPHY;  Surgeon: Elam Dutch, MD;  Location: [place] [facility];  Service: Cardiovascular;  Laterality: Bilateral;  . REVISON OF ARTERIOVENOUS FISTULA Right 12/14/2012   Procedure: REVISON OF upper arm ARTERIOVENOUS FISTULA using 84mmx10cm gortex graft;  Surgeon: Conrad Millston, MD;  Location: [place];  Service: Vascular;  Laterality: Right;  . SOFT TISSUE MASS EXCISION     Right arm, Left leg  for MRSA infection    Current Medications: No outpatient medications have been marked as taking for the 08/07/20 encounter (Appointment) with Imogene Burn, PA-C.      Allergies:   Amoxicillin, Hydromorphone, Percocet [oxycodone-acetaminophen], and Gabapentin   Social History   Socioeconomic History  . Marital status: Married    Spouse name: Not on file  . Number of children: Not on file  . Years of education: Not on file  . Highest education level: Not on file  Occupational History  . Not on file  Tobacco Use  . Smoking status: Never Smoker  . Smokeless tobacco: Never Used  Vaping Use  . Vaping Use: Never used  Substance and Sexual Activity  . Alcohol use: No  . Drug use: No  . Sexual activity: Yes  Other Topics Concern  . Not on file  Social History Narrative  . Not on file   Social Determinants of Health   Financial Resource Strain:   . Difficulty of Paying Living Expenses:  Not on file  Food Insecurity:   . Worried About Charity fundraiser in the Last Year: Not on file  . Ran Out of Food in the Last Year: Not on file  Transportation Needs:   . Lack of Transportation (Medical): Not on file  . Lack of Transportation (Non-Medical): Not on file  Physical Activity:   . Days of Exercise per Week: Not on file  . Minutes of Exercise per Session: Not on file  Stress:   . Feeling of Stress : Not on file  Social Connections:   . Frequency of Communication with Friends and Family: Not on file  . Frequency of Social Gatherings with Friends and Family: Not on file  . Attends Religious Services: Not on file  . Active Member of Clubs or Organizations: Not on file  . Attends Archivist Meetings: Not on file  . Marital Status: Not on file     Family History:  The patient's ***family history includes Diabetes in his mother.   ROS:   Please see the history of present illness.    ROS All other systems reviewed and are negative.   PHYSICAL EXAM:   VS:  There were no vitals taken for this visit.  Physical Exam  GEN: Well nourished, well developed, in no acute distress  HEENT: normal  Neck: no JVD, carotid bruits, or  masses Cardiac:RRR; no murmurs, rubs, or gallops  Respiratory:  clear to auscultation bilaterally, normal work of breathing GI: soft, nontender, nondistended, + BS Ext: without cyanosis, clubbing, or edema, Good distal pulses bilaterally MS: no deformity or atrophy  Skin: warm and dry, no rash Neuro:  Alert and Oriented x 3, Strength and sensation are intact Psych: euthymic mood, full affect  Wt Readings from Last 3 Encounters:  08/06/20 207 lb (93.9 kg)  07/20/19 207 lb (93.9 kg)  06/28/19 213 lb 8 oz (96.8 kg)      Studies/Labs Reviewed:   EKG:  EKG is*** ordered today.  The ekg ordered today demonstrates ***  Recent Labs: 08/05/2020: ALT 28 08/06/2020: BUN 87; Creatinine, Ser 11.20; Hemoglobin 10.2; Platelets 96; Potassium 5.5; Sodium 140   Lipid Panel    Component Value Date/Time   CHOL 231 (H) 05/15/2019 0931   TRIG 236 (H) 05/15/2019 0931   HDL 43 05/15/2019 0931   CHOLHDL 5.4 05/15/2019 0931   VLDL 47 (H) 05/15/2019 0931   LDLCALC 141 (H) 05/15/2019 0931   LDLDIRECT 166.1 11/13/2008 1202    Additional studies/ records that were reviewed today include:  2D echo 08/02/20 [facility] OTHER ORDERS - 08/02/2020 3:38 PM EDT             [facility]            [place], [place]                                                     [id number]  DOB: 16-Jul-1966               CARDIAC DIAGNOSTIC UNIT               Date: 08/02/2020 13:00:00  Adult     Male   Age: 46                 ECHO-DOPPLER REPORT                 Outpatient                                                     MPDC ---------------------------------------------------- MD1: Everlena Cooper Gise    STUDY: Chest Wall          TAPE: 0000:00:0:00:00 BP: 181/98     ECHO: Yes   DOPPLER: Yes  FILE: [id number]:   HR: 76    COLOR: Yes  CONTRAST: No      MACHINE: EpiQ #6   Height: 70 in RV BIOPSY: No         3D: Yes  SOUND QLTY: Moderate  Weight: 196 lbs    MEDIUM: None                                      BSA: 2.10,  BMI: 28.10 ------------------------------------------------------------------------------   HISTORY:  Renal dialysis    REASON: Assess LV function INDICATION: Z01.818 - Encounter for other preprocedural examination. N18.6 -            End stage renal disease (CMS-HCC). Z99.2 - Dependence on renal            dialysis (CMS-HCC). R01.1 - Cardiac murmur, unspecified.   ECHOCARDIOGRAPHIC MEASUREMENTS ----------------------------------------------- 2D DIMENSIONS AORTA          Values     Normal RangeMAIN PA      Values     Normal Range     Annulus:  nm*  cm    [2 - 3.2]      PA Main:   2.1 cm    [1.5 - 2.1]   Aorta Sin:   2.7 cm    [2.8 - 4]   RIGHT VENTRICLE ST Junction:  nm*  cm    [2.3 - 3.5]    RV Base:   4.8 cm    [2.5 - 4.1]   Asc.Aorta:   2.9 cm    [2.2 - 3.8]     RV Mid:   3.5 cm    [1.9 - 3.5] LEFT VENTRICLE                         RV Length:  nm*  cm    [  ]       LVIDd:   4.9 cm    [4.2 - 5.8] RIGHT ATRIUM       LVIDs:   3.4 cm    [2.4 - 4]      RA Area:  17   cm2   [ <= 20]      LVEDVi:  81.0 ml/m2 [34 - 74]         RAVi:  22   ml/m2 [11 - 39]      LVESVi:  30.0 ml/m2 [11 - 31]   INFERIOR VENA CAVA          FS:  31   %     [ >= 25]       Max.IVC:  1.8 cm    [ <= 2.1]         SWT:   1.2 cm    [0.6 - 1]      Min.IVC:   1.7 cm    [ <= 1.7]         PWT:   1.2 cm    [0.6 - 1]   __________________ LEFT ATRIUM                           nm* - not measured     LA Diam:   4.7 cm    [3 - 4]     LA Area:  20   cm2   [ <= 20]   LA Volume:  80   mL    [18 - 58]        LAVi:  38   ml/m2 [16 - 34]  ECHOCARDIOGRAPHIC DESCRIPTIONS ----------------------------------------------- AORTIC ROOT        Size: Normal  Dissection: INDETERM FOR DISSECTION  AORTIC VALVE    Leaflets: Tricuspid             Morphology: MILDLY THICKENED    Mobility: PARTIALLY MOBILE  LEFT VENTRICLE                                      Anterior: Normal         Size: Normal                                 Lateral: Normal Contraction: Normal                                  Septal: Normal  Closest EF: >55%(Estimated)  Calc.EF: 63% (3D)      Apical: Normal   LV masses: No Masses                             Inferior: Normal         LVH: MILD LVH CONCENTRIC                  Posterior: Normal LV GLS(LOL): -12.1% Dias.FxClass: RESTRICTIVE FILLING PATTERN (GRADE 3) CORRESPONDS TO REVERSIBLE              RESTRICTIVE PATTERN  MITRAL VALVE    Leaflets: Normal                  Mobility: Fully mobile  Morphology: Normal  LEFT ATRIUM        Size: MILDLY ENLARGED   LA masses: No masses              Normal IAS  MAIN PA        Size: Normal     PA Note: PA acceleration time = 62msec  PULMONIC VALVE  Morphology: Normal    Mobility: Fully Mobile  RIGHT VENTRICLE        Size: MILDLY ENLARGED           Free wall: Normal Contraction: Normal                    RV masses: No Masses       TAPSE:  1.4 cm,  Normal Range [>= 1.6 cm]  TRICUSPID VALVE    Leaflets: Normal                  Mobility: Fully mobile  Morphology: Normal  RIGHT ATRIUM        Size: Normal                     RA Other: None   RA masses: No masses  PERICARDIUM       Fluid: No effusion  INFERIOR VENACAVA        Size: Normal     ABNORMAL RESPIRATORY COLLAPSE  DOPPLER ECHO and OTHER SPECIAL PROCEDURES ------------------------------------   Aortic: No AR                  No AS    Mitral: TRIVIAL MR             No MS   MV Inflow E Vel.= 125.0 cm/s  MV Annulus E'Vel.= 9.0 cm/s  E/E'Ratio= 14  Tricuspid: MODERATE TR            No TS           3.6 m/s peak TR vel   59 mmHg peak RV pressure  Pulmonary: TRIVIAL PR             No PS     Other:  INTERPRETATION ---------------------------------------------------------------  NORMAL LEFT VENTRICULAR SYSTOLIC FUNCTION WITH MILD LVH  ELEVATED LA PRESSURES WITH DIASTOLIC DYSFUNCTION  NORMAL RIGHT VENTRICULAR SYSTOLIC FUNCTION   VALVULAR REGURGITATION: TRIVIAL MR, TRIVIAL PR, MODERATE TR  NO VALVULAR STENOSIS  Aortic valve sclerosis present  NO PRIOR STUDY FOR COMPARISON  3D acquisition and reconstructions were performed as part of this  examination to more accurately quantify the effects of identified  structural abnormalities as part of the exam. (post-processing on an  Independent workstation).   (Report version 3.0)                    Interpreted and Electronically signed Perform. by: Jacqualine Mau, RCS              by: Ladell Heads, MD Resp.Person: Domenic Schwab, BS, RDCS             On: 08/02/2020 15:38:23            ASSESSMENT:    No diagnosis found.   PLAN:  In order of problems listed above:  Chest pain in the ER 08/05/2020 with chest pain relieved with Tylenol.  Troponins were not checked.  Needs ischemic work-up.  Preoperative clearance for angiography of the lower extremities 08/13/2020.  Was seen by [facility] cardiology and 2D echo done 08/02/2020 normal LVEF with mild LVH grade 3 DD moderate TR  ESRD on HD  Hypertension  Diabetes mellitus     Medication Adjustments/Labs and Tests Ordered: Current medicines are reviewed at length with the patient today.  Concerns regarding medicines are outlined above.  Medication changes, Labs and Tests ordered today are listed in the Patient Instructions below. There are no Patient Instructions on file for this visit.   Sumner Boast, PA-C  08/06/2020 12:23 PM    [facility] [place], [place], [place]  [address] Phone: [phone]; Fax: [phone]

## 2020-08-06 NOTE — Progress Notes (Addendum)
Subjective: Patient reports feeling better this AM. Overnight he reported chest pain that started on the right shoulder/chest area and spread to the left. Patient reports that Tylenol 3 significantly relieved his pain. He is not currently having chest pain. He is reporting some epigastric pain that radiates up his throat and is relieved when he burps.   Objective:  Vital signs in last 24 hours: Vitals:   08/05/20 2004 08/06/20 0325 08/06/20 0852 08/06/20 1210  BP: (!) 167/88 (!) 176/92 (!) 171/97 (!) 171/96  Pulse: 71 71 99 70  Resp: 18 20 20 18   Temp: 98.2 F (36.8 C) 97.9 F (36.6 C) 98.3 F (36.8 C) 98.3 F (36.8 C)  TempSrc: Oral Oral Oral Oral  SpO2: 95% 100% 99% 98%  Weight:  93.9 kg    Height:       Physical Exam Constitutional:      Appearance: He is well-developed. He is not ill-appearing.  HENT:     Head: Normocephalic and atraumatic.  Cardiovascular:     Rate and Rhythm: Normal rate and regular rhythm.     Heart sounds: Murmur heard.   Pulmonary:     Effort: Pulmonary effort is normal.     Breath sounds: Normal breath sounds.  Abdominal:     General: Bowel sounds are normal.     Palpations: Abdomen is soft.  Neurological:     General: No focal deficit present.     Mental Status: He is alert.  Psychiatric:        Mood and Affect: Mood normal.     Assessment/Plan:  Principal Problem:   Chest pain  Left sided chest pain Patient continued to have intermittent chest pain that radiated into both arms overnight. Patient received Tylenol 3 that relieved his pain. Concern that with patient's hx of PAD he may also have CAD and is having ischemia of coronary vessels. Patient likely needs stress test and/or cath. D dimer was mildly elevated. CTA was negative for PE. - Cardiology, Dr.Ross, has been consulted, patient has OP tom with [facility] 08/06/2020 - Recommend patient f/u with his Cardiologist outpatient for further ischemic workup - Continue Atorvastatin  40mg  -Nitroglycerin 0.4 PRN x3 is limit  HTN Patient history of HTN. - Continue Amlodipine 10mg   PAD Patient has a history of PAD. He is scheduled to have a stent placed in his L leg within the next 2 weeks. On exam today both lower extremities were well perfused. Patient likely is suffering from a ischemia and neuropathy causing his lower leg numbness and pain. This may also be causing patient to have Left side arm pain as well.  - Patient should follow up with his vascular surgery physicians outpatient, scheduled for L leg angiogram OP - Continue ASA  T2DM Today patient is more clear that he has been actively trying to loose weight. He reports that he was 300+ lbs in the past and has been trying to have a healthier liefstyle. Patient reports that he was able to eat dinner well. - Monitor PO intake  Nausea Patient reports nausea with no emesis. Concern that patient may be developing gastroparesis vs GERD. Na and K were WNL.  Will continue to assess. Patient is reporting some reflux. - Zofran 4mg  - Heart Healthy diet - GI cocktail - Pepcid 10mg  MWF after HD   ESRD MWF Patient is ESRD and compliant with HD. Last HD was Friday.  - Nephro has been consulted. Patient should be receiving HD today.  Pancytopenia Patient  noted to have low WBC;s, Hgb, and Plt on repeat CBC. Baseline WBC appears to be around 6 and Plts tend to be >300. Patient has ESRD likely causing anemia of chronic disease. Concern for Aplastic anemia vs MDS vs possible malignancy. -F/u Blood smear, saved smear -F/u Fe studies - F/u B12 -F/u Retic studies -F/u haptoglobin - hepatitis serologies   Prior to Admission Living Arrangement: Home Anticipated Discharge Location: Home Barriers to Discharge: Treatment Dispo: Anticipated discharge in approximately 1-2 day(s).   Freida Busman, MD 08/06/2020, 12:54 PM Pager: [phone] After 5pm on weekdays and 1pm on weekends: On Call pager [phone]

## 2020-08-06 NOTE — Consult Note (Signed)
Cardiology Consultation:   Patient ID: [name] MRN: [id number]; DOB: 31-Dec-1965  Admit date: 08/05/2020 Date of Consult: 08/06/2020  Primary Care Provider: Wellington Hampshire, MD [facility] Cardiologist: Candee Furbish, MD . [name]   Patient Profile:   [name] is a 54 y.o. male with a hx of PAD who is being seen today for the evaluation of CP at the request of Dr Rebeca Alert    History of Present Illness:   [name] is a 54 yo who has a hx of ESRD (on dialyssi), DM, HT, HL, PAD. He had a stress test in 2014 that showed no ischemia   He was last seen in cardiology clinic in September 2020  At that time had chst discomfort  Felt atypical but a myovue was recommendd   He did not have done    Pt recnetly seen in vascular clinic at [facility]  Being evaluated for L leg revascularlization (percutaneous)   ANd echo was ordered and performed on 08/02/20 This showed LVEF normal  Restrictive filling pattern (Gr III diastolic dysfunction)  Moderate TR   The pt says he has not had his full energy since his toe amputation on R which was 2020  Over the past few weeks he has not slept well   When he is in bed he wakes up with chest pressure (across chest ) radiating to L arm and l leg   Has to sit up    Symptmos are not exacerbated or relieved by change in positio or with deep breath  With spell yesterday the pt presented to [name]    The pt says that last Friday dialysis did not go well   He just felt "strange" during this     ROS signfi for decreased appetite and dry heaves    Past Medical History:  Diagnosis Date  . Abscess of right foot   . Acute osteomyelitis of right foot ([place]) 05/15/2019  . Anemia 06/23/2019  . Cellulitis of right foot 05/22/2019  . Chest pain 08/23/2013  . Chills    at night - sometimes  . Chronic kidney disease, stage V ([place]) 08/23/2013  . Complication of anesthesia   . Diabetes mellitus    controlled by diet  . Diabetes with renal manifestations(250.4)  08/23/2013  . Diabetic foot ulcer- right second toe 05/14/2019  . Diabetic infection of right foot ([place])   . Diabetic retinopathy associated with type 2 diabetes mellitus ([place]) 06/23/2019  . Eczema   . ESRD on dialysis ([place]) 07/18/2011  . Essential hypertension 10/18/2007   Qualifier: Diagnosis of  By: Loanne Drilling MD, Jacelyn Pi   . Fatigue    loss of fatigue  . FOOT ULCER, LEFT 06/29/2008   Qualifier: Diagnosis of  By: Loanne Drilling MD, Sean A   . Gangrene of toe of right foot ([place])   . Headache(784.0)    Years ago  . Heart murmur    years ago  . Hemodialysis status ([place]) 08/23/2013  . History of blood transfusion   . Hyperglycemia 05/05/2015  . HYPERLIPIDEMIA 05/16/2008   Qualifier: Diagnosis of  By: Jenny Reichmann MD, Hunt Oris   . Hypertension    sees Dr. Jaci Standard  . Hypoglycemia, unspecified 11/13/2008   Qualifier: Diagnosis of  By: Jenny Reichmann MD, Hunt Oris   . MRSA (methicillin resistant staph aureus) culture positive   . MRSA INFECTION 10/18/2007   Qualifier: Diagnosis of  By: Marca Ancona RMA, Lucy    . Obesity, unspecified 08/23/2013  .  Osteomyelitis of great toe of right foot ([place]) 05/14/2019  . Other forms of retinal detachment(361.89) 06/23/2019  . PONV (postoperative nausea and vomiting)   . Poor circulation   . Renal disorder   . RENAL INSUFFICIENCY 07/31/2008   Qualifier: Diagnosis of  By: Jenny Reichmann MD, Hunt Oris   . Secondary hyperparathyroidism ([place]) 06/23/2019  . Sepsis ([place]) 05/14/2019  . Type 2 diabetes mellitus ([place]) 08/23/2013  . Ulcer of right foot, with necrosis of bone ([place])   . Ulcer of right second toe with necrosis of bone [facility])     Past Surgical History:  Procedure Laterality Date  . ABDOMINAL AORTOGRAM N/A 05/17/2019   Procedure: ABDOMINAL AORTOGRAM;  Surgeon: Elam Dutch, MD;  Location: [place] [facility];  Service: Cardiovascular;  Laterality: N/A;  . AMPUTATION TOE Right 05/14/2019   Procedure: PARTIAL AMPUTATION SECOND TOE RIGHT FOOT;  Surgeon: Evelina Bucy, DPM;  Location: [place];  Service:  Podiatry;  Laterality: Right;  . AMPUTATION TOE Right 05/23/2019   Procedure: AMPUTATION OF SECOND TOE METATARSAL PHALANGEAL JOINT;  Surgeon: Evelina Bucy, DPM;  Location: [place];  Service: Podiatry;  Laterality: Right;  . AMPUTATION TOE Right 05/27/2019   Procedure: Amputation Toe;  Surgeon: Evelina Bucy, DPM;  Location: [place];  Service: Podiatry;  Laterality: Right;  right third toe  . APPLICATION OF WOUND VAC Right 05/27/2019   Procedure: Application Of Wound Vac;  Surgeon: Evelina Bucy, DPM;  Location: [place];  Service: Podiatry;  Laterality: Right;  . AV FISTULA PLACEMENT  06-18-11   Right brachiocephalic AVF  . BONE BIOPSY Right 05/14/2019   Procedure: OPEN SUPERFICIAL BONE BIOPSY GREAT TOE;  Surgeon: Evelina Bucy, DPM;  Location: [place];  Service: Podiatry;  Laterality: Right;  . EYE SURGERY     left eye for Laser, diabetic retinopathy  . FISTULOGRAM Right 11/06/2011   Procedure: FISTULOGRAM;  Surgeon: Conrad [place], MD;  Location: [facility] CATH LAB;  Service: Cardiovascular;  Laterality: Right;  . HEMATOMA EVACUATION Right Feb. 25, 2014  . HEMATOMA EVACUATION Right 12/14/2012   Procedure: EVACUATION HEMATOMA;  Surgeon: Conrad [place], MD;  Location: [place];  Service: Vascular;  Laterality: Right;  . INCISION AND DRAINAGE Right 05/23/2019   Procedure: INCISION AND DRAINAGE OF RIGHT FOOT DEEP SPACE ABSCESS;  Surgeon: Evelina Bucy, DPM;  Location: [place];  Service: Podiatry;  Laterality: Right;  . INSERTION OF DIALYSIS CATHETER  12/14/2012   Procedure: INSERTION OF DIALYSIS CATHETER;  Surgeon: Conrad [place], MD;  Location: [place];  Service: Vascular;;  . IRRIGATION AND DEBRIDEMENT FOOT Right 05/25/2019   Procedure: INCISION AND DRAINAGE FOOT;  Surgeon: Evelina Bucy, DPM;  Location: [place];  Service: Podiatry;  Laterality: Right;  . IRRIGATION AND DEBRIDEMENT FOOT Right 05/27/2019   Procedure: IRRIGATION AND DEBRIDEMENT FOOT;  Surgeon: Evelina Bucy, DPM;  Location: [place];  Service: Podiatry;   Laterality: Right;  . LOWER EXTREMITY ANGIOGRAPHY Bilateral 05/17/2019   Procedure: LOWER EXTREMITY ANGIOGRAPHY;  Surgeon: Elam Dutch, MD;  Location: [place] [facility];  Service: Cardiovascular;  Laterality: Bilateral;  . REVISON OF ARTERIOVENOUS FISTULA Right 12/14/2012   Procedure: REVISON OF upper arm ARTERIOVENOUS FISTULA using 5mmx10cm gortex graft;  Surgeon: Conrad [place], MD;  Location: [place];  Service: Vascular;  Laterality: Right;  . SOFT TISSUE MASS EXCISION     Right arm, Left leg  for MRSA infection       Inpatient Medications: Scheduled  Meds: . amLODipine  10 mg Oral Daily  . aspirin EC  81 mg Oral Daily  . atorvastatin  40 mg Oral Daily  . [START ON 08/08/2020] famotidine  10 mg Oral Q M,W,F-HD  . insulin aspart  0-5 Units Subcutaneous QHS  . insulin aspart  0-6 Units Subcutaneous TID WC  . ramelteon  8 mg Oral QHS  . sevelamer carbonate  800 mg Oral TID WC   Continuous Infusions:  PRN Meds: acetaminophen, acetaminophen-codeine, melatonin, nitroGLYCERIN, ondansetron (ZOFRAN) IV  Allergies:    Allergies  Allergen Reactions  . Amoxicillin Itching    Did it involve swelling of the face/tongue/throat, SOB, or low BP? Unknown Did it involve sudden or severe rash/hives, skin peeling, or any reaction on the inside of your mouth or nose? Unknown Did you need to seek medical attention at a hospital or doctor's office? Unknown When did it last happen?20 years ago If all above answers are "NO", may proceed with cephalosporin use.   Marland Kitchen Hydromorphone Itching    Patient states he may take with benadryl. Makes feet itch.  Marland Kitchen Percocet [Oxycodone-Acetaminophen] Itching and Other (See Comments)    Legs only. Itching and burning feeling.  . Gabapentin Nausea And Vomiting    Stomach issues     Social History:   Social History   Socioeconomic History  . Marital status: Married    Spouse name: Not on file  . Number of children: Not on file  . Years of education:  Not on file  . Highest education level: Not on file  Occupational History  . Not on file  Tobacco Use  . Smoking status: Never Smoker  . Smokeless tobacco: Never Used  Vaping Use  . Vaping Use: Never used  Substance and Sexual Activity  . Alcohol use: No  . Drug use: No  . Sexual activity: Yes  Other Topics Concern  . Not on file  Social History Narrative  . Not on file   Social Determinants of Health   Financial Resource Strain:   . Difficulty of Paying Living Expenses: Not on file  Food Insecurity:   . Worried About Charity fundraiser in the Last Year: Not on file  . Ran Out of Food in the Last Year: Not on file  Transportation Needs:   . Lack of Transportation (Medical): Not on file  . Lack of Transportation (Non-Medical): Not on file  Physical Activity:   . Days of Exercise per Week: Not on file  . Minutes of Exercise per Session: Not on file  Stress:   . Feeling of Stress : Not on file  Social Connections:   . Frequency of Communication with Friends and Family: Not on file  . Frequency of Social Gatherings with Friends and Family: Not on file  . Attends Religious Services: Not on file  . Active Member of Clubs or Organizations: Not on file  . Attends Archivist Meetings: Not on file  . Marital Status: Not on file  Intimate Partner Violence:   . Fear of Current or Ex-Partner: Not on file  . Emotionally Abused: Not on file  . Physically Abused: Not on file  . Sexually Abused: Not on file    Family History:   Family History  Problem Relation Age of Onset  . Diabetes Mother      ROS:  Please see the history of present illness.   All other ROS reviewed and negative.     Physical Exam/Data:  Vitals:   08/05/20 2004 08/06/20 0325 08/06/20 0852 08/06/20 1210  BP: (!) 167/88 (!) 176/92 (!) 171/97 (!) 171/96  Pulse: 71 71 99 70  Resp: 18 20 20 18   Temp: 98.2 F (36.8 C) 97.9 F (36.6 C) 98.3 F (36.8 C) 98.3 F (36.8 C)  TempSrc: Oral Oral  Oral Oral  SpO2: 95% 100% 99% 98%  Weight:  93.9 kg    Height:        Intake/Output Summary (Last 24 hours) at 08/06/2020 1354 Last data filed at 08/06/2020 1000 Gross per 24 hour  Intake 340 ml  Output --  Net 340 ml   Last 3 Weights 08/06/2020 08/05/2020 07/20/2019  Weight (lbs) 207 lb 195 lb 207 lb  Weight (kg) 93.895 kg 88.451 kg 93.895 kg     Body mass index is 29.7 kg/m.  General:  Well nourished, well developed, in no acute distres HEENT: normal Lymph: no adenopathy Neck: JVP is 10 cm   L bruit   Endocrine:  No thryomegaly Vascular; FA pulses 2+ bilaterally without bruits  Cardiac:  normal S1, S2; RRR; Gr III/VI systolic murmur LSB    Lungs:  clear to auscultation bilaterally, no wheezing, rhonchi or rales  Abd: soft,  LIver distended with mild RUQ tenderness  Ext: Tr edema   S/p R transmet amputation   Musculoskeletal:  Amputation  Skin: warm and dry   Bandage on R foot   Neuro:  CNs 2-12 intact, no focal abnormalities noted Psych:  Normal affect  EKG:  The EKG was personally reviewed and demonstrates:  UYesterday:  SR    SL ST depression with T wave inversion V5, V6, II, III, AVF   Telemetry:  Telemetry was personally reviewed and demonstrates: SR   Relevant CV Studies:  Echo  08/02/20  [facility]  INTERPRETATION ---------------------------------------------------------------  NORMAL LEFT VENTRICULAR SYSTOLIC FUNCTION WITH MILD LVH  ELEVATED LA PRESSURES WITH DIASTOLIC DYSFUNCTION  NORMAL RIGHT VENTRICULAR SYSTOLIC FUNCTION  VALVULAR REGURGITATION: TRIVIAL MR, TRIVIAL PR, MODERATE TR  NO VALVULAR STENOSIS  Aortic valve sclerosis present  NO PRIOR STUDY FOR COMPARISON  3D acquisition and reconstructions were performed as part of this  examination to more accurately quantify the effects of identified  structural abnormalities as part of the exam. (post-processing on an  Independent workstation).    Laboratory Data:  High Sensitivity  Troponin:   Recent Labs  Lab 08/05/20 0547 08/05/20 0855 08/05/20 1437 08/05/20 1610  TROPONINIHS 31* 31* 27* 27*     Chemistry Recent Labs  Lab 08/05/20 0547 08/05/20 1437 08/06/20 0320  NA 139 138 140  K 5.0 5.9* 5.5*  CL 97* 99 100  CO2 27 22 21*  GLUCOSE 117* 127* 115*  BUN 68* 73* 87*  CREATININE 9.92* 10.58* 11.20*  CALCIUM 9.4 9.1 9.2  GFRNONAA 5* 5* 5*  ANIONGAP 15 17* 19*    Recent Labs  Lab 08/05/20 1437 08/06/20 0320  PROT 7.1  --   ALBUMIN 3.5 3.4*  AST 26  --   ALT 28  --   ALKPHOS 138*  --   BILITOT 1.4*  --    Hematology Recent Labs  Lab 08/05/20 0547 08/06/20 0320  WBC 3.2* 3.7*  RBC 3.71* 3.64*  HGB 10.3* 10.2*  HCT 33.4* 31.9*  MCV 90.0 87.6  MCH 27.8 28.0  MCHC 30.8 32.0  RDW 21.5* 21.2*  PLT 99* 96*   BNPNo results for input(s): BNP, PROBNP in the last 168 hours.  DDimer  Recent Labs  Lab 08/05/20 1437  DDIMER 0.61*     Radiology/Studies:  DG Chest 2 View  Result Date: 08/05/2020 CLINICAL DATA:  Chest pain EXAM: CHEST - 2 VIEW COMPARISON:  08/05/2019 FINDINGS: Lungs are clear.  No pleural effusion or pneumothorax. The heart is normal in size. Visualized osseous structures are within normal limits. IMPRESSION: Normal chest radiographs. Electronically Signed   By: Julian Hy M.D.   On: 08/05/2020 06:10   CT ANGIO CHEST AORTA W/CM & OR WO/CM  Result Date: 08/05/2020 CLINICAL DATA:  Chest pain for several days EXAM: CT ANGIOGRAPHY CHEST WITH CONTRAST TECHNIQUE: Multidetector CT imaging of the chest was performed using the standard protocol during bolus administration of intravenous contrast. Multiplanar CT image reconstructions and MIPs were obtained to evaluate the vascular anatomy. CONTRAST:  17mL OMNIPAQUE IOHEXOL 350 MG/ML SOLN COMPARISON:  Chest x-ray from earlier in the same day. FINDINGS: Cardiovascular: Initial precontrast images show atherosclerotic calcification without hyperdense crescent to suggest acute aortic  injury. Post-contrast images demonstrate the ascending aorta to be within normal limits. No dissection or aneurysmal dilatation is seen. Heart is mildly enlarged. Pulmonary artery shows a normal branching pattern. No definitive filling defect to suggest pulmonary embolism is seen. There is a calcification in the midportion of the right innominate vein likely related to prior dialysis catheter placement and calcified thrombus. This is not flow limiting. Patent stent is noted within the subclavian vein system on the right Mediastinum/Nodes: Thoracic inlet is within normal limits. No sizable hilar or mediastinal adenopathy is noted. The esophagus as visualized is within normal limits. Lungs/Pleura: The lungs are well aerated bilaterally. A few calcified granulomas are noted. Mild dependent atelectatic changes are seen. No sizable parenchymal nodule is noted. Upper Abdomen: Vicarious excretion of contrast is noted within the gallbladder. The liver is diffusely decreased in attenuation which may be related to the timing of the contrast bolus. Very mild ascites is seen surrounding the liver. Musculoskeletal: No chest wall abnormality. No acute or significant osseous findings. Review of the MIP images confirms the above findings. IMPRESSION: No evidence of aortic dissection or aneurysmal dilatation. No large central pulmonary emboli are seen. Mild ascites in the abdomen. Aortic Atherosclerosis (ICD10-I70.0). Electronically Signed   By: Inez Catalina M.D.   On: 08/05/2020 20:05     Assessment and Plan:   1  Chest pain  Pt with known vascular disease of legs   Prsents with a 3 wk history of chest /shoulder discomfort    Pain when in bed    ? If represents PND like event . CT yesterday shows extensive calcifications of the coronary arteries consistent with CAD  Given significant change in symtpoms over past few weeksand  Progressive/increased DOE along with problem with dialysis on Friday I would recomm further  evaluation with Right and Left heart catheterization    Myovue still with signif false negative rate   CT could be difficult given extent of Ca    Risks and benefits.  Pt understands and agrees to proceed     WIll plan on this for tomorrow  2  LIpids   Last lipids were 1 year ago   LDL 141  HDL 43  Trig 236  For questions or updates, please contact [place] Please consult [email or web address] for contact info under    Signed, [name] Carnes, MD  08/06/2020 1:54 PM

## 2020-08-06 NOTE — H&P (View-Only) (Signed)
Cardiology Consultation:   Patient ID: [name] MRN: [id number]; DOB: 08-Mar-1966  Admit date: 08/05/2020 Date of Consult: 08/06/2020  Primary Care Provider: [place], [name] Cardiologist: [name] Furbish, [name] . [name]   Patient Profile:   [name] is [name] 54 y.o. male with [name] hx of PAD who is being seen today for the evaluation of CP at the request of Dr [name]    History of Present Illness:   [name] is [name] 54 yo who has [name] hx of ESRD (on dialyssi), DM, HT, HL, PAD. He had [name] stress test in 2014 that showed no ischemia   He was last seen in cardiology clinic in September 2020  At that time had chst discomfort  Felt atypical but [name] myovue was recommendd   He did not have done    Pt recnetly seen in vascular clinic at [facility]  Being evaluated for L leg revascularlization (percutaneous)   ANd echo was ordered and performed on 08/02/20 This showed LVEF normal  Restrictive filling pattern (Gr III diastolic dysfunction)  Moderate TR   The pt says he has not had his full energy since his toe amputation on R which was 2020  Over the past few weeks he has not slept well   When he is in bed he wakes up with chest pressure (across chest ) radiating to L arm and l leg   Has to sit up    Symptmos are not exacerbated or relieved by change in positio or with deep breath  With spell yesterday the pt presented to [name]    The pt says that last Friday dialysis did not go well   He just felt "strange" during this     ROS signfi for decreased appetite and dry heaves    Past Medical History:  Diagnosis Date  . Abscess of right foot   . Acute osteomyelitis of right foot ([place]) 05/15/2019  . Anemia 06/23/2019  . Cellulitis of right foot 05/22/2019  . Chest pain 08/23/2013  . Chills    at night - sometimes  . Chronic kidney disease, stage V ([place]) 08/23/2013  . Complication of anesthesia   . Diabetes mellitus    controlled by diet  . Diabetes with renal manifestations(250.4)  08/23/2013  . Diabetic foot ulcer- right second toe 05/14/2019  . Diabetic infection of right foot ([place])   . Diabetic retinopathy associated with type 2 diabetes mellitus ([place]) 06/23/2019  . Eczema   . ESRD on dialysis ([place]) 07/18/2011  . Essential hypertension 10/18/2007   Qualifier: Diagnosis of  By: [name] [name], [name]   . Fatigue    loss of fatigue  . FOOT ULCER, LEFT 06/29/2008   Qualifier: Diagnosis of  By: [name] [name], [name]   . Gangrene of toe of right foot ([place])   . Headache(784.0)    Years ago  . Heart murmur    years ago  . Hemodialysis status ([place]) 08/23/2013  . History of blood transfusion   . Hyperglycemia 05/05/2015  . HYPERLIPIDEMIA 05/16/2008   Qualifier: Diagnosis of  By: [name] Reichmann [name], [name]   . Hypertension    sees Dr. Jaci [name]  . Hypoglycemia, unspecified 11/13/2008   Qualifier: Diagnosis of  By: [name] Reichmann [name], [name]   . MRSA (methicillin resistant staph aureus) culture positive   . MRSA INFECTION 10/18/2007   Qualifier: Diagnosis of  By: [name], [name]    . Obesity, unspecified 08/23/2013  .  Osteomyelitis of great toe of right foot ([place]) 05/14/2019  . Other forms of retinal detachment(361.89) 06/23/2019  . PONV (postoperative nausea and vomiting)   . Poor circulation   . Renal disorder   . RENAL INSUFFICIENCY 07/31/2008   Qualifier: Diagnosis of  By: [name] Reichmann [name], [name]   . Secondary hyperparathyroidism ([place]) 06/23/2019  . Sepsis ([place]) 05/14/2019  . Type 2 diabetes mellitus ([place]) 08/23/2013  . Ulcer of right foot, with necrosis of bone ([place])   . Ulcer of right second toe with necrosis of bone [place])     Past Surgical History:  Procedure Laterality Date  . ABDOMINAL AORTOGRAM N/[name] 05/17/2019   Procedure: ABDOMINAL AORTOGRAM;  Surgeon: [name] Dutch, [name];  Location: [place] CV [facility];  Service: Cardiovascular;  Laterality: N/[name];  . AMPUTATION TOE Right 05/14/2019   Procedure: PARTIAL AMPUTATION SECOND TOE RIGHT FOOT;  Surgeon: [name], DPM;  Location: [place];  Service:  Podiatry;  Laterality: Right;  . AMPUTATION TOE Right 05/23/2019   Procedure: AMPUTATION OF SECOND TOE METATARSAL PHALANGEAL JOINT;  Surgeon: [name], DPM;  Location: [place] ;  Service: Podiatry;  Laterality: Right;  . AMPUTATION TOE Right 05/27/2019   Procedure: Amputation Toe;  Surgeon: [name], DPM;  Location: [place];  Service: Podiatry;  Laterality: Right;  right third toe  . APPLICATION OF WOUND VAC Right 05/27/2019   Procedure: Application Of Wound Vac;  Surgeon: [name], DPM;  Location: [place];  Service: Podiatry;  Laterality: Right;  . AV FISTULA PLACEMENT  06-18-11   Right brachiocephalic AVF  . BONE BIOPSY Right 05/14/2019   Procedure: OPEN SUPERFICIAL BONE BIOPSY GREAT TOE;  Surgeon: [name], DPM;  Location: [place];  Service: Podiatry;  Laterality: Right;  . EYE SURGERY     left eye for Laser, diabetic retinopathy  . FISTULOGRAM Right 11/06/2011   Procedure: FISTULOGRAM;  Surgeon: [name] Iuka, [name];  Location: [facility] CATH LAB;  Service: Cardiovascular;  Laterality: Right;  . HEMATOMA EVACUATION Right Feb. 25, 2014  . HEMATOMA EVACUATION Right 12/14/2012   Procedure: EVACUATION HEMATOMA;  Surgeon: [name] Waterloo, [name];  Location: [place];  Service: Vascular;  Laterality: Right;  . INCISION AND DRAINAGE Right 05/23/2019   Procedure: INCISION AND DRAINAGE OF RIGHT FOOT DEEP SPACE ABSCESS;  Surgeon: [name], DPM;  Location: [place];  Service: Podiatry;  Laterality: Right;  . INSERTION OF DIALYSIS CATHETER  12/14/2012   Procedure: INSERTION OF DIALYSIS CATHETER;  Surgeon: [name] Maplewood, [name];  Location: [place];  Service: Vascular;;  . IRRIGATION AND DEBRIDEMENT FOOT Right 05/25/2019   Procedure: INCISION AND DRAINAGE FOOT;  Surgeon: [name], DPM;  Location: [place];  Service: Podiatry;  Laterality: Right;  . IRRIGATION AND DEBRIDEMENT FOOT Right 05/27/2019   Procedure: IRRIGATION AND DEBRIDEMENT FOOT;  Surgeon: [name], DPM;  Location: [place];  Service: Podiatry;   Laterality: Right;  . LOWER EXTREMITY ANGIOGRAPHY Bilateral 05/17/2019   Procedure: LOWER EXTREMITY ANGIOGRAPHY;  Surgeon: [name] Dutch, [name];  Location: [place] CV [facility];  Service: Cardiovascular;  Laterality: Bilateral;  . REVISON OF ARTERIOVENOUS FISTULA Right 12/14/2012   Procedure: REVISON OF upper arm ARTERIOVENOUS FISTULA using 76mmx10cm gortex graft;  Surgeon: [name] , [name];  Location: [place];  Service: Vascular;  Laterality: Right;  . SOFT TISSUE MASS EXCISION     Right arm, Left leg  for MRSA infection       Inpatient Medications: Scheduled  Meds: . amLODipine  10 mg Oral Daily  . aspirin EC  81 mg Oral Daily  . atorvastatin  40 mg Oral Daily  . [START ON 08/08/2020] famotidine  10 mg Oral Q M,W,F-HD  . insulin aspart  0-5 Units Subcutaneous QHS  . insulin aspart  0-6 Units Subcutaneous TID WC  . ramelteon  8 mg Oral QHS  . sevelamer carbonate  800 mg Oral TID WC   Continuous Infusions:  PRN Meds: acetaminophen, acetaminophen-codeine, melatonin, nitroGLYCERIN, ondansetron (ZOFRAN) IV  Allergies:    Allergies  Allergen Reactions  . Amoxicillin Itching    Did it involve swelling of the face/tongue/throat, SOB, or low BP? Unknown Did it involve sudden or severe rash/hives, skin peeling, or any reaction on the inside of your mouth or nose? Unknown Did you need to seek medical attention at [name] hospital or doctor's office? Unknown When did it last happen?20 years ago If all above answers are "NO", may proceed with cephalosporin use.   Marland Kitchen Hydromorphone Itching    Patient states he may take with benadryl. Makes feet itch.  Marland Kitchen Percocet [Oxycodone-Acetaminophen] Itching and Other (See Comments)    Legs only. Itching and burning feeling.  . Gabapentin Nausea And Vomiting    Stomach issues     Social History:   Social History   Socioeconomic History  . Marital status: Married    Spouse name: Not on file  . Number of children: Not on file  . Years of education:  Not on file  . Highest education level: Not on file  Occupational History  . Not on file  Tobacco Use  . Smoking status: Never Smoker  . Smokeless tobacco: Never Used  Vaping Use  . Vaping Use: Never used  Substance and Sexual Activity  . Alcohol use: No  . Drug use: No  . Sexual activity: Yes  Other Topics Concern  . Not on file  Social History Narrative  . Not on file   Social Determinants of Health   Financial Resource Strain:   . Difficulty of Paying Living Expenses: Not on file  Food Insecurity:   . Worried About Charity fundraiser in the Last Year: Not on file  . Ran Out of Food in the Last Year: Not on file  Transportation Needs:   . Lack of Transportation (Medical): Not on file  . Lack of Transportation (Non-Medical): Not on file  Physical Activity:   . Days of Exercise per Week: Not on file  . Minutes of Exercise per Session: Not on file  Stress:   . Feeling of Stress : Not on file  Social Connections:   . Frequency of Communication with Friends and Family: Not on file  . Frequency of Social Gatherings with Friends and Family: Not on file  . Attends Religious Services: Not on file  . Active Member of Clubs or Organizations: Not on file  . Attends Archivist Meetings: Not on file  . Marital Status: Not on file  Intimate Partner Violence:   . Fear of Current or Ex-Partner: Not on file  . Emotionally Abused: Not on file  . Physically Abused: Not on file  . Sexually Abused: Not on file    Family History:   Family History  Problem Relation Age of Onset  . Diabetes Mother      ROS:  Please see the history of present illness.   All other ROS reviewed and negative.     Physical Exam/Data:  Vitals:   08/05/20 2004 08/06/20 0325 08/06/20 0852 08/06/20 1210  BP: (!) 167/88 (!) 176/92 (!) 171/97 (!) 171/96  Pulse: 71 71 99 70  Resp: 18 20 20 18   Temp: 98.2 F (36.8 C) 97.9 F (36.6 C) 98.3 F (36.8 C) 98.3 F (36.8 C)  TempSrc: Oral Oral  Oral Oral  SpO2: 95% 100% 99% 98%  Weight:  93.9 kg    Height:        Intake/Output Summary (Last 24 hours) at 08/06/2020 1354 Last data filed at 08/06/2020 1000 Gross per 24 hour  Intake 340 ml  Output --  Net 340 ml   Last 3 Weights 08/06/2020 08/05/2020 07/20/2019  Weight (lbs) 207 lb 195 lb 207 lb  Weight (kg) 93.895 kg 88.451 kg 93.895 kg     Body mass index is 29.7 kg/m.  General:  Well nourished, well developed, in no acute distres HEENT: normal Lymph: no adenopathy Neck: JVP is 10 cm   L bruit   Endocrine:  No thryomegaly Vascular; FA pulses 2+ bilaterally without bruits  Cardiac:  normal S1, S2; RRR; Gr III/VI systolic murmur LSB    Lungs:  clear to auscultation bilaterally, no wheezing, rhonchi or rales  Abd: soft,  LIver distended with mild RUQ tenderness  Ext: Tr edema   S/p R transmet amputation   Musculoskeletal:  Amputation  Skin: warm and dry   Bandage on R foot   Neuro:  CNs 2-12 intact, no focal abnormalities noted Psych:  Normal affect  EKG:  The EKG was personally reviewed and demonstrates:  UYesterday:  SR    SL ST depression with T wave inversion V5, V6, II, III, AVF   Telemetry:  Telemetry was personally reviewed and demonstrates: SR   Relevant CV Studies:  Echo  08/02/20  [facility]  INTERPRETATION ---------------------------------------------------------------  NORMAL LEFT VENTRICULAR SYSTOLIC FUNCTION WITH MILD LVH  ELEVATED LA PRESSURES WITH DIASTOLIC DYSFUNCTION  NORMAL RIGHT VENTRICULAR SYSTOLIC FUNCTION  VALVULAR REGURGITATION: TRIVIAL MR, TRIVIAL PR, MODERATE TR  NO VALVULAR STENOSIS  Aortic valve sclerosis present  NO PRIOR STUDY FOR COMPARISON  3D acquisition and reconstructions were performed as part of this  examination to more accurately quantify the effects of identified  structural abnormalities as part of the exam. (post-processing on an  Independent workstation).    Laboratory Data:  High Sensitivity  Troponin:   Recent Labs  Lab 08/05/20 0547 08/05/20 0855 08/05/20 1437 08/05/20 1610  TROPONINIHS 31* 31* 27* 27*     Chemistry Recent Labs  Lab 08/05/20 0547 08/05/20 1437 08/06/20 0320  NA 139 138 140  K 5.0 5.9* 5.5*  CL 97* 99 100  CO2 27 22 21*  GLUCOSE 117* 127* 115*  BUN 68* 73* 87*  CREATININE 9.92* 10.58* 11.20*  CALCIUM 9.4 9.1 9.2  GFRNONAA 5* 5* 5*  ANIONGAP 15 17* 19*    Recent Labs  Lab 08/05/20 1437 08/06/20 0320  PROT 7.1  --   ALBUMIN 3.5 3.4*  AST 26  --   ALT 28  --   ALKPHOS 138*  --   BILITOT 1.4*  --    Hematology Recent Labs  Lab 08/05/20 0547 08/06/20 0320  WBC 3.2* 3.7*  RBC 3.71* 3.64*  HGB 10.3* 10.2*  HCT 33.4* 31.9*  MCV 90.0 87.6  MCH 27.8 28.0  MCHC 30.8 32.0  RDW 21.5* 21.2*  PLT 99* 96*   BNPNo results for input(s): BNP, PROBNP in the last 168 hours.  DDimer  Recent Labs  Lab 08/05/20 1437  DDIMER 0.61*     Radiology/Studies:  DG Chest 2 View  Result Date: 08/05/2020 CLINICAL DATA:  Chest pain EXAM: CHEST - 2 VIEW COMPARISON:  08/05/2019 FINDINGS: Lungs are clear.  No pleural effusion or pneumothorax. The heart is normal in size. Visualized osseous structures are within normal limits. IMPRESSION: Normal chest radiographs. Electronically Signed   By: Julian Hy M.D.   On: 08/05/2020 06:10   CT ANGIO CHEST AORTA W/CM & OR WO/CM  Result Date: 08/05/2020 CLINICAL DATA:  Chest pain for several days EXAM: CT ANGIOGRAPHY CHEST WITH CONTRAST TECHNIQUE: Multidetector CT imaging of the chest was performed using the [name] protocol during bolus administration of intravenous contrast. Multiplanar CT image reconstructions and MIPs were obtained to evaluate the vascular anatomy. CONTRAST:  172mL OMNIPAQUE IOHEXOL 350 MG/ML SOLN COMPARISON:  Chest x-ray from earlier in the same day. FINDINGS: Cardiovascular: Initial precontrast images show atherosclerotic calcification without hyperdense crescent to suggest acute aortic  injury. Post-contrast images demonstrate the ascending aorta to be within normal limits. No dissection or aneurysmal dilatation is seen. Heart is mildly enlarged. Pulmonary artery shows [name] normal branching pattern. No definitive filling defect to suggest pulmonary embolism is seen. There is [name] calcification in the midportion of the right innominate vein likely related to prior dialysis catheter placement and calcified thrombus. This is not flow limiting. Patent stent is noted within the subclavian vein system on the right Mediastinum/Nodes: Thoracic inlet is within normal limits. No sizable hilar or mediastinal adenopathy is noted. The esophagus as visualized is within normal limits. Lungs/Pleura: The lungs are well aerated bilaterally. [name] few calcified granulomas are noted. Mild dependent atelectatic changes are seen. No sizable parenchymal nodule is noted. Upper Abdomen: Vicarious excretion of contrast is noted within the gallbladder. The liver is diffusely decreased in attenuation which may be related to the timing of the contrast bolus. Very mild ascites is seen surrounding the liver. Musculoskeletal: No chest wall abnormality. No acute or significant osseous findings. Review of the MIP images confirms the above findings. IMPRESSION: No evidence of aortic dissection or aneurysmal dilatation. No large central pulmonary emboli are seen. Mild ascites in the abdomen. Aortic Atherosclerosis (ICD10-I70.0). Electronically Signed   By: Inez Catalina M.D.   On: 08/05/2020 20:05     Assessment and Plan:   1  Chest pain  Pt with known vascular disease of legs   Prsents with [name] 3 wk history of chest /shoulder discomfort    Pain when in bed    ? If represents PND like event . CT yesterday shows extensive calcifications of the coronary arteries consistent with CAD  Given significant change in symtpoms over past few weeksand  Progressive/increased DOE along with problem with dialysis on Friday I would recomm further  evaluation with Right and Left heart catheterization    Myovue still with signif false negative rate   CT could be difficult given extent of Ca    Risks and benefits.  Pt understands and agrees to proceed     WIll plan on this for tomorrow  2  LIpids   Last lipids were 1 year ago   LDL 141  HDL 43  Trig 236  For questions or updates, please contact [place] Please consult [email or web address] for contact info under    Signed, [name] Carnes, [name]  08/06/2020 1:54 PM

## 2020-08-06 NOTE — Progress Notes (Signed)
Patient c/o 8/10 pain to bilateral arms not relieved by PRN Tylenol. He stated Tylenol #3 works better for him. On call IM resident made aware and ordered Tylenol #3. Med given per order and it was effective. Will continue to monitor patient.

## 2020-08-07 ENCOUNTER — Encounter (HOSPITAL_COMMUNITY): Payer: Self-pay | Admitting: Internal Medicine

## 2020-08-07 ENCOUNTER — Inpatient Hospital Stay (HOSPITAL_COMMUNITY)
Admission: EM | Disposition: A | Discharge status: Home / Self Care | Payer: Self-pay | Source: Home / Self Care | Attending: Internal Medicine

## 2020-08-07 ENCOUNTER — Ambulatory Visit: Payer: Medicare Other | Admitting: Physician Assistant

## 2020-08-07 DIAGNOSIS — E11319 Type 2 diabetes mellitus with unspecified diabetic retinopathy without macular edema: Secondary | ICD-10-CM | POA: Diagnosis present

## 2020-08-07 DIAGNOSIS — L309 Dermatitis, unspecified: Secondary | ICD-10-CM | POA: Diagnosis present

## 2020-08-07 DIAGNOSIS — M898X9 Other specified disorders of bone, unspecified site: Secondary | ICD-10-CM | POA: Diagnosis present

## 2020-08-07 DIAGNOSIS — R600 Localized edema: Secondary | ICD-10-CM | POA: Diagnosis not present

## 2020-08-07 DIAGNOSIS — K219 Gastro-esophageal reflux disease without esophagitis: Secondary | ICD-10-CM | POA: Diagnosis present

## 2020-08-07 DIAGNOSIS — R1031 Right lower quadrant pain: Secondary | ICD-10-CM | POA: Diagnosis not present

## 2020-08-07 DIAGNOSIS — Z9861 Coronary angioplasty status: Secondary | ICD-10-CM | POA: Diagnosis not present

## 2020-08-07 DIAGNOSIS — N2581 Secondary hyperparathyroidism of renal origin: Secondary | ICD-10-CM | POA: Diagnosis present

## 2020-08-07 DIAGNOSIS — T82838A Hemorrhage of vascular prosthetic devices, implants and grafts, initial encounter: Secondary | ICD-10-CM | POA: Diagnosis not present

## 2020-08-07 DIAGNOSIS — I272 Pulmonary hypertension, unspecified: Secondary | ICD-10-CM

## 2020-08-07 DIAGNOSIS — Z880 Allergy status to penicillin: Secondary | ICD-10-CM | POA: Diagnosis not present

## 2020-08-07 DIAGNOSIS — G8918 Other acute postprocedural pain: Secondary | ICD-10-CM | POA: Diagnosis not present

## 2020-08-07 DIAGNOSIS — I25110 Atherosclerotic heart disease of native coronary artery with unstable angina pectoris: Secondary | ICD-10-CM | POA: Diagnosis present

## 2020-08-07 DIAGNOSIS — G43909 Migraine, unspecified, not intractable, without status migrainosus: Secondary | ICD-10-CM | POA: Diagnosis present

## 2020-08-07 DIAGNOSIS — E785 Hyperlipidemia, unspecified: Secondary | ICD-10-CM | POA: Diagnosis present

## 2020-08-07 DIAGNOSIS — R110 Nausea: Secondary | ICD-10-CM | POA: Diagnosis not present

## 2020-08-07 DIAGNOSIS — I10 Essential (primary) hypertension: Secondary | ICD-10-CM | POA: Diagnosis not present

## 2020-08-07 DIAGNOSIS — L97525 Non-pressure chronic ulcer of other part of left foot with muscle involvement without evidence of necrosis: Secondary | ICD-10-CM | POA: Diagnosis not present

## 2020-08-07 DIAGNOSIS — Z79899 Other long term (current) drug therapy: Secondary | ICD-10-CM | POA: Diagnosis not present

## 2020-08-07 DIAGNOSIS — Z20822 Contact with and (suspected) exposure to covid-19: Secondary | ICD-10-CM | POA: Diagnosis present

## 2020-08-07 DIAGNOSIS — Z7982 Long term (current) use of aspirin: Secondary | ICD-10-CM | POA: Diagnosis not present

## 2020-08-07 DIAGNOSIS — D631 Anemia in chronic kidney disease: Secondary | ICD-10-CM | POA: Diagnosis present

## 2020-08-07 DIAGNOSIS — N186 End stage renal disease: Secondary | ICD-10-CM | POA: Diagnosis present

## 2020-08-07 DIAGNOSIS — Z992 Dependence on renal dialysis: Secondary | ICD-10-CM | POA: Diagnosis not present

## 2020-08-07 DIAGNOSIS — I120 Hypertensive chronic kidney disease with stage 5 chronic kidney disease or end stage renal disease: Secondary | ICD-10-CM | POA: Diagnosis present

## 2020-08-07 DIAGNOSIS — E8770 Fluid overload, unspecified: Secondary | ICD-10-CM | POA: Diagnosis not present

## 2020-08-07 DIAGNOSIS — R079 Chest pain, unspecified: Secondary | ICD-10-CM | POA: Diagnosis present

## 2020-08-07 DIAGNOSIS — E1122 Type 2 diabetes mellitus with diabetic chronic kidney disease: Secondary | ICD-10-CM | POA: Diagnosis present

## 2020-08-07 DIAGNOSIS — Z885 Allergy status to narcotic agent status: Secondary | ICD-10-CM | POA: Diagnosis not present

## 2020-08-07 DIAGNOSIS — E1151 Type 2 diabetes mellitus with diabetic peripheral angiopathy without gangrene: Secondary | ICD-10-CM | POA: Diagnosis present

## 2020-08-07 DIAGNOSIS — Z833 Family history of diabetes mellitus: Secondary | ICD-10-CM | POA: Diagnosis not present

## 2020-08-07 DIAGNOSIS — Z7901 Long term (current) use of anticoagulants: Secondary | ICD-10-CM | POA: Diagnosis not present

## 2020-08-07 DIAGNOSIS — E7800 Pure hypercholesterolemia, unspecified: Secondary | ICD-10-CM | POA: Diagnosis present

## 2020-08-07 DIAGNOSIS — R220 Localized swelling, mass and lump, head: Secondary | ICD-10-CM

## 2020-08-07 DIAGNOSIS — I9789 Other postprocedural complications and disorders of the circulatory system, not elsewhere classified: Secondary | ICD-10-CM | POA: Diagnosis not present

## 2020-08-07 DIAGNOSIS — K130 Diseases of lips: Secondary | ICD-10-CM | POA: Diagnosis present

## 2020-08-07 DIAGNOSIS — D61818 Other pancytopenia: Secondary | ICD-10-CM | POA: Diagnosis present

## 2020-08-07 HISTORY — PX: RIGHT/LEFT HEART CATH AND CORONARY ANGIOGRAPHY: CATH118266

## 2020-08-07 LAB — POCT I-STAT 7, (LYTES, BLD GAS, ICA,H+H)
Acid-Base Excess: 5 mmol/L — ABNORMAL HIGH (ref 0.0–2.0)
Bicarbonate: 30.1 mmol/L — ABNORMAL HIGH (ref 20.0–28.0)
Calcium, Ion: 1.16 mmol/L (ref 1.15–1.40)
HCT: 30 % — ABNORMAL LOW (ref 39.0–52.0)
Hemoglobin: 10.2 g/dL — ABNORMAL LOW (ref 13.0–17.0)
O2 Saturation: 96 %
Potassium: 4.4 mmol/L (ref 3.5–5.1)
Sodium: 137 mmol/L (ref 135–145)
TCO2: 32 mmol/L (ref 22–32)
pCO2 arterial: 48.5 mmHg — ABNORMAL HIGH (ref 32.0–48.0)
pH, Arterial: 7.4 (ref 7.350–7.450)
pO2, Arterial: 86 mmHg (ref 83.0–108.0)

## 2020-08-07 LAB — CBC WITH DIFFERENTIAL/PLATELET
Abs Immature Granulocytes: 0 10*3/uL (ref 0.00–0.07)
Basophils Absolute: 0 10*3/uL (ref 0.0–0.1)
Basophils Relative: 1 %
Eosinophils Absolute: 0.2 10*3/uL (ref 0.0–0.5)
Eosinophils Relative: 6 %
HCT: 31.5 % — ABNORMAL LOW (ref 39.0–52.0)
Hemoglobin: 10.2 g/dL — ABNORMAL LOW (ref 13.0–17.0)
Immature Granulocytes: 0 %
Lymphocytes Relative: 17 %
Lymphs Abs: 0.5 10*3/uL — ABNORMAL LOW (ref 0.7–4.0)
MCH: 27.9 pg (ref 26.0–34.0)
MCHC: 32.4 g/dL (ref 30.0–36.0)
MCV: 86.1 fL (ref 80.0–100.0)
Monocytes Absolute: 0.3 10*3/uL (ref 0.1–1.0)
Monocytes Relative: 10 %
Neutro Abs: 1.9 10*3/uL (ref 1.7–7.7)
Neutrophils Relative %: 66 %
Platelets: 98 10*3/uL — ABNORMAL LOW (ref 150–400)
RBC: 3.66 MIL/uL — ABNORMAL LOW (ref 4.22–5.81)
RDW: 20.9 % — ABNORMAL HIGH (ref 11.5–15.5)
WBC: 2.8 10*3/uL — ABNORMAL LOW (ref 4.0–10.5)
nRBC: 0 % (ref 0.0–0.2)

## 2020-08-07 LAB — POCT I-STAT EG7
Acid-Base Excess: 5 mmol/L — ABNORMAL HIGH (ref 0.0–2.0)
Bicarbonate: 31 mmol/L — ABNORMAL HIGH (ref 20.0–28.0)
Calcium, Ion: 1.16 mmol/L (ref 1.15–1.40)
HCT: 31 % — ABNORMAL LOW (ref 39.0–52.0)
Hemoglobin: 10.5 g/dL — ABNORMAL LOW (ref 13.0–17.0)
O2 Saturation: 78 %
Potassium: 4.4 mmol/L (ref 3.5–5.1)
Sodium: 137 mmol/L (ref 135–145)
TCO2: 32 mmol/L (ref 22–32)
pCO2, Ven: 49.4 mmHg (ref 44.0–60.0)
pH, Ven: 7.405 (ref 7.250–7.430)
pO2, Ven: 43 mmHg (ref 32.0–45.0)

## 2020-08-07 LAB — COMPREHENSIVE METABOLIC PANEL
ALT: 31 U/L (ref 0–44)
AST: 25 U/L (ref 15–41)
Albumin: 3.3 g/dL — ABNORMAL LOW (ref 3.5–5.0)
Alkaline Phosphatase: 150 U/L — ABNORMAL HIGH (ref 38–126)
Anion gap: 14 (ref 5–15)
BUN: 47 mg/dL — ABNORMAL HIGH (ref 6–20)
CO2: 27 mmol/L (ref 22–32)
Calcium: 9.2 mg/dL (ref 8.9–10.3)
Chloride: 97 mmol/L — ABNORMAL LOW (ref 98–111)
Creatinine, Ser: 7.9 mg/dL — ABNORMAL HIGH (ref 0.61–1.24)
GFR, Estimated: 7 mL/min — ABNORMAL LOW (ref >60)
Glucose, Bld: 95 mg/dL (ref 70–99)
Potassium: 4.2 mmol/L (ref 3.5–5.1)
Sodium: 138 mmol/L (ref 135–145)
Total Bilirubin: 1.1 mg/dL (ref 0.3–1.2)
Total Protein: 6.8 g/dL (ref 6.5–8.1)

## 2020-08-07 LAB — LIPID PANEL
Cholesterol: 167 mg/dL (ref 0–200)
HDL: 91 mg/dL (ref >40)
LDL Cholesterol: 61 mg/dL (ref 0–99)
Total CHOL/HDL Ratio: 1.8 RATIO
Triglycerides: 73 mg/dL (ref <150)
VLDL: 15 mg/dL (ref 0–40)

## 2020-08-07 LAB — VITAMIN B12: Vitamin B-12: 736 pg/mL (ref 180–914)

## 2020-08-07 LAB — GLUCOSE, CAPILLARY
Glucose-Capillary: 101 mg/dL — ABNORMAL HIGH (ref 70–99)
Glucose-Capillary: 160 mg/dL — ABNORMAL HIGH (ref 70–99)
Glucose-Capillary: 167 mg/dL — ABNORMAL HIGH (ref 70–99)
Glucose-Capillary: 92 mg/dL (ref 70–99)
Glucose-Capillary: 95 mg/dL (ref 70–99)

## 2020-08-07 LAB — IRON AND TIBC
Iron: 41 ug/dL — ABNORMAL LOW (ref 45–182)
Saturation Ratios: 21 % (ref 17.9–39.5)
TIBC: 196 ug/dL — ABNORMAL LOW (ref 250–450)
UIBC: 155 ug/dL

## 2020-08-07 LAB — HEPATITIS C ANTIBODY: HCV Ab: NONREACTIVE

## 2020-08-07 LAB — HEPATITIS B SURFACE ANTIGEN: Hepatitis B Surface Ag: NONREACTIVE

## 2020-08-07 LAB — HEPATITIS B CORE ANTIBODY, IGM: Hep B C IgM: NONREACTIVE

## 2020-08-07 SURGERY — RIGHT/LEFT HEART CATH AND CORONARY ANGIOGRAPHY
Anesthesia: LOCAL

## 2020-08-07 MED ORDER — MIDAZOLAM HCL 2 MG/2ML IJ SOLN
INTRAMUSCULAR | Status: DC | PRN
  Administered 2020-08-07 (×2): 1 mg via INTRAVENOUS

## 2020-08-07 MED ORDER — VERAPAMIL HCL 2.5 MG/ML IV SOLN
INTRAVENOUS | Status: AC
  Filled 2020-08-07: qty 2

## 2020-08-07 MED ORDER — SODIUM CHLORIDE 0.9% FLUSH
3.0000 mL | Freq: Two times a day (BID) | INTRAVENOUS | Status: DC
  Administered 2020-08-07 – 2020-08-08 (×3): 3 mL via INTRAVENOUS

## 2020-08-07 MED ORDER — FENTANYL CITRATE (PF) 100 MCG/2ML IJ SOLN
INTRAMUSCULAR | Status: AC
  Filled 2020-08-07: qty 2

## 2020-08-07 MED ORDER — CARVEDILOL 12.5 MG PO TABS
12.5000 mg | ORAL_TABLET | Freq: Two times a day (BID) | ORAL | Status: DC
  Administered 2020-08-07: 12.5 mg via ORAL
  Filled 2020-08-07: qty 1

## 2020-08-07 MED ORDER — MIDAZOLAM HCL 2 MG/2ML IJ SOLN
INTRAMUSCULAR | Status: AC
  Filled 2020-08-07: qty 2

## 2020-08-07 MED ORDER — HEPARIN SODIUM (PORCINE) 1000 UNIT/ML IJ SOLN
INTRAMUSCULAR | Status: AC
  Filled 2020-08-07: qty 1

## 2020-08-07 MED ORDER — DIPHENHYDRAMINE HCL 50 MG/ML IJ SOLN
INTRAMUSCULAR | Status: AC
  Filled 2020-08-07: qty 1

## 2020-08-07 MED ORDER — CARVEDILOL 6.25 MG PO TABS: 6.2500 mg | ORAL_TABLET | Freq: Two times a day (BID) | ORAL | Status: DC

## 2020-08-07 MED ORDER — HEPARIN SODIUM (PORCINE) 5000 UNIT/ML IJ SOLN
5000.0000 [IU] | Freq: Three times a day (TID) | INTRAMUSCULAR | Status: DC
  Administered 2020-08-07 – 2020-08-08 (×2): 5000 [IU] via SUBCUTANEOUS
  Filled 2020-08-07 (×4): qty 1

## 2020-08-07 MED ORDER — SEVELAMER CARBONATE 800 MG PO TABS
1600.0000 mg | ORAL_TABLET | Freq: Three times a day (TID) | ORAL | Status: DC
  Administered 2020-08-07 – 2020-08-09 (×2): 1600 mg via ORAL
  Filled 2020-08-07 (×4): qty 2

## 2020-08-07 MED ORDER — LIDOCAINE HCL (PF) 1 % IJ SOLN
INTRAMUSCULAR | Status: DC | PRN
  Administered 2020-08-07: 10 mL

## 2020-08-07 MED ORDER — HEPARIN (PORCINE) IN NACL 1000-0.9 UT/500ML-% IV SOLN
INTRAVENOUS | Status: AC
  Filled 2020-08-07: qty 1000

## 2020-08-07 MED ORDER — HEPARIN (PORCINE) IN NACL 1000-0.9 UT/500ML-% IV SOLN
INTRAVENOUS | Status: DC | PRN
  Administered 2020-08-07 (×2): 500 mL

## 2020-08-07 MED ORDER — IOHEXOL 350 MG/ML SOLN
INTRAVENOUS | Status: DC | PRN
  Administered 2020-08-07: 60 mL

## 2020-08-07 MED ORDER — FENTANYL CITRATE (PF) 100 MCG/2ML IJ SOLN
INTRAMUSCULAR | Status: DC | PRN
Start: 2020-08-07 — End: 2020-08-07
  Administered 2020-08-07 (×2): 25 ug via INTRAVENOUS

## 2020-08-07 MED ORDER — DIPHENHYDRAMINE HCL 50 MG/ML IJ SOLN
INTRAMUSCULAR | Status: DC | PRN
  Administered 2020-08-07 (×2): 25 mg via INTRAVENOUS

## 2020-08-07 MED ORDER — HYDRALAZINE HCL 20 MG/ML IJ SOLN
10.0000 mg | INTRAMUSCULAR | Status: AC | PRN
  Administered 2020-08-07: 10 mg via INTRAVENOUS

## 2020-08-07 MED ORDER — SODIUM CHLORIDE 0.9% FLUSH: 3.0000 mL | INTRAVENOUS | Status: DC | PRN

## 2020-08-07 MED ORDER — DIPHENHYDRAMINE HCL 50 MG/ML IJ SOLN
25.0000 mg | Freq: Four times a day (QID) | INTRAMUSCULAR | Status: DC | PRN
  Administered 2020-08-08: 25 mg via INTRAVENOUS

## 2020-08-07 MED ORDER — SODIUM CHLORIDE 0.9 % IV SOLN: 250.0000 mL | INTRAVENOUS | Status: DC | PRN

## 2020-08-07 MED ORDER — LIDOCAINE HCL (PF) 1 % IJ SOLN
INTRAMUSCULAR | Status: AC
  Filled 2020-08-07: qty 30

## 2020-08-07 MED ORDER — HYDRALAZINE HCL 20 MG/ML IJ SOLN
INTRAMUSCULAR | Status: AC
  Filled 2020-08-07: qty 1

## 2020-08-07 SURGICAL SUPPLY — 12 items
CATH INFINITI 5FR MULTPACK ANG (CATHETERS) ×1 IMPLANT
CATH SWAN GANZ 7F STRAIGHT (CATHETERS) ×1 IMPLANT
KIT HEART LEFT (KITS) ×2 IMPLANT
PACK CARDIAC CATHETERIZATION (CUSTOM PROCEDURE TRAY) ×2 IMPLANT
SHEATH PINNACLE 5F 10CM (SHEATH) ×1 IMPLANT
SHEATH PINNACLE 6F 10CM (SHEATH) IMPLANT
SHEATH PINNACLE 7F 10CM (SHEATH) ×1 IMPLANT
SHEATH PROBE COVER 6X72 (BAG) ×1 IMPLANT
TRANSDUCER W/STOPCOCK (MISCELLANEOUS) ×2 IMPLANT
TUBING CIL FLEX 10 FLL-RA (TUBING) ×2 IMPLANT
WIRE EMERALD 3MM-J .035X150CM (WIRE) ×1 IMPLANT
WIRE EMERALD 3MM-J .035X260CM (WIRE) IMPLANT

## 2020-08-07 NOTE — Progress Notes (Signed)
Subjective: The patient is doing well this AM. Received page from RN that patient has swollen lip. Patient is not having pain at his lip and does not report SOB or tongue swelling. Per patient recount he was wiping his face when a piece of skin on lip exfoliated and he noticed that his lip was swollen. Otherwise patient does not report chest pain at this time. Patient wife was in room this AM. She reported that patient had put on some vaseline that she just happened to throw away when she got there. Wife was kind enough to dig through the trash to find the vaseline. Patient was not allergic to any of the ingredients after going through.  Objective:  Vital signs in last 24 hours: Vitals:   08/07/20 1040 08/07/20 1045 08/07/20 1050 08/07/20 1113  BP: (!) 153/89 (!) 159/88 (!) 165/86 (!) 152/85  Pulse: 81 81 79 83  Resp: 15 16 17 17   Temp:    98.2 F (36.8 C)  TempSrc:    Oral  SpO2: 97% 99% 100% 99%  Weight:      Height:       Physical Exam Constitutional:      Appearance: He is well-developed.  HENT:     Head: Normocephalic and atraumatic.     Comments: Swollen bottom lip, blistering of bottom lip with exfoliation of 1 blister on the l side of the lip. No tenderness to palpation. Earlier stage blister in the middle of the bottom lip. Inside of lip appeared normal and well perfused.  * Photo available in media  Tongue was normal size Cardiovascular:     Rate and Rhythm: Normal rate and regular rhythm.     Heart sounds: Murmur heard.   Pulmonary:     Effort: Pulmonary effort is normal.     Breath sounds: Normal breath sounds.     Comments: No wheeze Abdominal:     General: Bowel sounds are normal.     Palpations: Abdomen is soft. There is no mass.  Skin:    General: Skin is warm and dry.  Neurological:     General: No focal deficit present.     Mental Status: He is alert.     Assessment/Plan:  Principal Problem:   Chest pain Active Problems:   ESRD on dialysis  ([place])   Type 2 diabetes mellitus ([place])   Pancytopenia ([place]) Mr. Davoli is a 54 yo male who presented to the ED with intermitted chest pain that occurred more often at rest. Patient has a PMH  ESRD on HD, PVD with TMA of right foot, T2DM, HTN, and HLD.  CAD Patient did not report Left sided chest pain this AM. Patient was evaluated by cardiology who recommended a R and LHC this AM. Patient underwent cath and was noted to have severe 3 vessel disease involving multiple branches. Patient was also noted to have severely elevated LV filling pressures and severe Pulm HTN. Patient was poorly suited for revascularization due to small branch disease. At this time Card recommends a lower dry weight and tighter control of BP with addition of Coreg. Unclear if patient is candidate for CABG at this time. - Cardiology consulted, appreciate recc's - Recommend patient f/u with his Cardiologist outpatient  - Continue Atorvastatin 40mg  -Nitroglycerin 0.4 PRN x3 is limit - Coreg 12.5 BID  HTN Patient history of HTN. Will require tighter control of his BP given severe CAD disease. - Continue Amlodipine 10mg   - Start Coreg 12.5  PAD  Patient has a history of PAD. He is scheduled to have a stent placed in his L leg within the next 2 weeks. On exam today both lower extremities were well perfused. Patient likely is suffering from a ischemia and neuropathy causing his lower leg numbness and pain. This may also be causing patient to have Left side arm pain as well.  - Patient should follow up with his vascular surgery physicians outpatient, scheduled for L leg angiogram OP - Continue ASA  T2DM CBG this AM 92. - SAI ac& qhs  Nausea Patient reports nausea with no emesis. Concern that patient may be developing gastroparesis vs GERD. Na and K were WNL. Will continue to assess. Patient is reporting some reflux. - Zofran 4mg  - Heart Healthy diet - GI cocktail - Pepcid 10mg  MWF after HD   ESRD MWF Patient  underwent HD yesterday. Per cardiology recommendations this AM, patient may need extra HD to get lower dry weight. Concern from nephro that patient does not tolerate large UF at one time.  - Nephro has been consulted. Patient should be receiving HD today.  Pancytopenia Patient noted to have low WBC;s, Hgb, and Plt on repeat CBC. Baseline WBC appears to be around 6 and Plts tend to be >300. Patient has ESRD likely causing anemia of chronic disease. Concern for Aplastic anemia has increased Retic count is suggests hypoproliferation. Patient B12, LDH, and Hep B Surface Antigen were normal. Iron studies suggested anemia of chronic disease as expected. At this time will recommend referral to Hematology OP.  -F/u Blood smear, saved smear -F/u haptoglobin - hepatitis serologies   Prior to Admission Living Arrangement: Home Anticipated Discharge Location: Home Barriers to Discharge: Treatment Dispo: Anticipated discharge in approximately 1-2 day(s).  Freida Busman, MD 08/07/2020, 2:06 PM Pager: [phone] After 5pm on weekdays and 1pm on weekends: On Call pager [phone]

## 2020-08-07 NOTE — Interval H&P Note (Signed)
History and Physical Interval Note:  08/07/2020 9:11 AM  [name]  has presented today for surgery, with the diagnosis of chest pain.  The various methods of treatment have been discussed with the patient and family. After consideration of risks, benefits and other options for treatment, the patient has consented to  Procedure(s): RIGHT/LEFT HEART CATH AND CORONARY ANGIOGRAPHY (N/A) as a surgical intervention.  The patient's history has been reviewed, patient examined, no change in status, stable for surgery.  I have reviewed the patient's chart and labs.  Questions were answered to the patient's satisfaction.    Cath Lab Visit (complete for each Cath Lab visit)  Clinical Evaluation Leading to the Procedure:   ACS: Yes.    Non-ACS:    Anginal Classification: CCS III  Anti-ischemic medical therapy: Maximal Therapy (2 or more classes of medications)  Non-Invasive Test Results: No non-invasive testing performed  Prior CABG: No previous CABG       Collier [facility] 08/07/2020 9:11 AM

## 2020-08-07 NOTE — Hospital Course (Addendum)
[name] is a 54 yo male with history of end-stage renal disease on hemodialysis, high cholesterol, hypertension, diabetes who presents to the hospital with chest pain.  CAD Patient presented with significant chest pain. ACS workup was negative. Patient continued on atorvastatin. Cardiology was consulted. Patient taking to cath for R and LHC and found to have severe 3 vessel obstructive disease with no targets for revascularization. Patient started on carvedilol and hydralazine per cardiology rec's. Carvedilol was changed to propanolol at discharge to attempt to also provide migraine prophylaxis.  Migraine Patient had migraine during HD when nephro attempted to pull 4L off patient. Patient had emesis associated with his migraine. Patient received Excedrin for the migraine.  HTN Patient continued on home medication amlodipine and remained hypertensive. After R and LHC cardiology recommended tighter control of BP and added hydralazine and carvedilol. Carvedilol was later changed to Propanolol at discharge.  PAD Patient reported previous stent in his R leg and had an upcoming stent scheduled for his left leg. Due to new CAD diagnosis Cardiology spoke with patient OP Cardiologist who canceled patient's upcoming left leg cath.   T2DM Patient did not require insulin inpatient.  Nausea Patient reported significant nausea with his chest pain. Placed on Zofran. Patient also noted reflux and was provided Pepcid.   ESRD MWF Patient dialyzed according to schedule. Cardiology recommended that due to patient's severe vessel disease his dry weight should be decreased. Attempted to increase UF to pull 4L off; however patient was unable to handle it. Recommend gradually increasing UF.  Incidental Pancytopenia Patient noted to have new onset pancytopenia. Workup was concerning for Aplastic anemia. Reticulocyte count Pct 1.1.Recommend that patient be referred to Hematology.

## 2020-08-07 NOTE — Progress Notes (Signed)
Site area: rt groin fa and fv sheaths Site Prior to Removal:  Level 0 Pressure Applied For: 20 minutes Manual:    yes Patient Status During Pull:  stable Post Pull Site:  Level 0 Post Pull Instructions Given:  yes Post Pull Pulses Present: rt dp dopplered Dressing Applied:  Gauze and tegaderm Bedrest begins @ 4136 Comments:

## 2020-08-07 NOTE — Telephone Encounter (Signed)
Please set up for NUC stress test for pre op evaluation Candee Furbish, MD

## 2020-08-07 NOTE — Progress Notes (Signed)
[facility] Progress Note   Subjective:  Seen in room. For right/left heart cath today. Says HD went ok yesterday. BP high - denies CP or dyspnea this morning.  Objective Vitals:   08/07/20 1003 08/07/20 1015 08/07/20 1020 08/07/20 1025  BP:  (!) 217/111 (!) 201/105 (!) 203/98  Pulse: (!) 0 97 93 89  Resp: (!) 40 18 17 15   Temp:      TempSrc:      SpO2: (!) 0% 99% 98% 98%  Weight:      Height:       Physical Exam General: Well appearing man, NAD. Room air. Heart: RRR; 2/6 murmur Lungs: CTAB; no rales Abdomen: soft Extremities: 2+ LE edema, Hx R TMA Dialysis Access: RUE AVF + bruit  Additional Objective Labs: Basic Metabolic Panel: Recent Labs  Lab 08/05/20 1437 08/06/20 0320 08/07/20 0555  NA 138 140 138  K 5.9* 5.5* 4.2  CL 99 100 97*  CO2 22 21* 27  GLUCOSE 127* 115* 95  BUN 73* 87* 47*  CREATININE 10.58* 11.20* 7.90*  CALCIUM 9.1 9.2 9.2  PHOS  --  10.9*  --    Liver Function Tests: Recent Labs  Lab 08/05/20 1437 08/06/20 0320 08/07/20 0555  AST 26  --  25  ALT 28  --  31  ALKPHOS 138*  --  150*  BILITOT 1.4*  --  1.1  PROT 7.1  --  6.8  ALBUMIN 3.5 3.4* 3.3*   Recent Labs  Lab 08/05/20 1437  LIPASE 25   CBC: Recent Labs  Lab 08/05/20 0547 08/06/20 0320 08/07/20 0555  WBC 3.2* 3.7* 2.8*  NEUTROABS  --  2.6 1.9  HGB 10.3* 10.2* 10.2*  HCT 33.4* 31.9* 31.5*  MCV 90.0 87.6 86.1  PLT 99* 96* 98*   CBG: Recent Labs  Lab 08/06/20 0854 08/06/20 1212 08/06/20 1612 08/07/20 0005 08/07/20 0730  GLUCAP 112* 143* 106* 160* 95   Iron Studies:  Recent Labs    08/06/20 1938  IRON 41*  TIBC 196*    Studies/Results: CARDIAC CATHETERIZATION  Result Date: 08/07/2020  Mid LAD lesion is 50% stenosed.  Dist LAD lesion is 80% stenosed.  2nd Diag lesion is 95% stenosed.  Lat 2nd Diag lesion is 100% stenosed.  1st Diag lesion is 90% stenosed.  Ramus lesion is 90% stenosed.  1st Mrg lesion is 95% stenosed.  2nd Mrg  lesion is 90% stenosed.  RPAV lesion is 80% stenosed.  Hemodynamic findings consistent with severe pulmonary hypertension.  LV end diastolic pressure is severely elevated.  1. Severe 3 vessel obstructive disease involving multiple branch vessels. 2. Severely elevated LV filling pressures. 30-33 mmHg 3. Severe pulmonary HTN- mean PAP 54 mm Hg 4. Good cardiac output. Index 5.33. Plan: need to optimize medical therapy and volume status.  His anatomy is poorly suited for revascularization due to small branch vessel disease. Only the second OM looks suitable for PCI and this alone would not significantly improve his outcome.   CT ANGIO CHEST AORTA W/CM & OR WO/CM  Result Date: 08/05/2020 CLINICAL DATA:  Chest pain for several days EXAM: CT ANGIOGRAPHY CHEST WITH CONTRAST TECHNIQUE: Multidetector CT imaging of the chest was performed using the standard protocol during bolus administration of intravenous contrast. Multiplanar CT image reconstructions and MIPs were obtained to evaluate the vascular anatomy. CONTRAST:  164mL OMNIPAQUE IOHEXOL 350 MG/ML SOLN COMPARISON:  Chest x-ray from earlier in the same day. FINDINGS: Cardiovascular: Initial precontrast images show atherosclerotic calcification  without hyperdense crescent to suggest acute aortic injury. Post-contrast images demonstrate the ascending aorta to be within normal limits. No dissection or aneurysmal dilatation is seen. Heart is mildly enlarged. Pulmonary artery shows a normal branching pattern. No definitive filling defect to suggest pulmonary embolism is seen. There is a calcification in the midportion of the right innominate vein likely related to prior dialysis catheter placement and calcified thrombus. This is not flow limiting. Patent stent is noted within the subclavian vein system on the right Mediastinum/Nodes: Thoracic inlet is within normal limits. No sizable hilar or mediastinal adenopathy is noted. The esophagus as visualized is within  normal limits. Lungs/Pleura: The lungs are well aerated bilaterally. A few calcified granulomas are noted. Mild dependent atelectatic changes are seen. No sizable parenchymal nodule is noted. Upper Abdomen: Vicarious excretion of contrast is noted within the gallbladder. The liver is diffusely decreased in attenuation which may be related to the timing of the contrast bolus. Very mild ascites is seen surrounding the liver. Musculoskeletal: No chest wall abnormality. No acute or significant osseous findings. Review of the MIP images confirms the above findings. IMPRESSION: No evidence of aortic dissection or aneurysmal dilatation. No large central pulmonary emboli are seen. Mild ascites in the abdomen. Aortic Atherosclerosis (ICD10-I70.0). Electronically Signed   By: Inez Catalina M.D.   On: 08/05/2020 20:05   Medications: . sodium chloride     . [MAR Hold] amLODipine  10 mg Oral Daily  . [MAR Hold] aspirin EC  81 mg Oral Daily  . [MAR Hold] atorvastatin  40 mg Oral Daily  . [MAR Hold] carvedilol  6.25 mg Oral BID WC  . [MAR Hold] Chlorhexidine Gluconate Cloth  6 each Topical Q0600  . [MAR Hold] darbepoetin (ARANESP) injection - DIALYSIS  100 mcg Intravenous Q Mon-HD  . [MAR Hold] doxercalciferol  6 mcg Intravenous Q M,W,F-HD  . [MAR Hold] famotidine  10 mg Oral Q M,W,F-HD  . [MAR Hold] ferric citrate  630 mg Oral TID WC  . heparin  5,000 Units Subcutaneous Q8H  . [MAR Hold] insulin aspart  0-5 Units Subcutaneous QHS  . [MAR Hold] insulin aspart  0-6 Units Subcutaneous TID WC  . [MAR Hold] ramelteon  8 mg Oral QHS  . [MAR Hold] sevelamer carbonate  800 mg Oral TID WC  . sodium chloride flush  3 mL Intravenous Q12H    Dialysis Orders: MWF @ [facility] 4h  450/ 800  88.5kg  2/2.25 bath  Hep 2000   RUA AVF  - hect 6 ug  - mircera 150ug q 2 wks, last 10/4, due today  Assessment/Plan: 1. Chest pain: CXR/CTA negative for PE but showed diffuse calcifications to coronary arteries. Cardiology consulted  - s/p [facility] today. Results with severe multivessel CAD + severely pulm HTN and elevated LV filling pressures. Unclear if he is a candidate for CABG? Needs volume offloading - will continue to address with dialysis -> the problem is he has chronically & adamantly refused to allow [place] to pull more fluid to get his EDW down - maybe the above findings will help convince him of the necessity. 2. ESRD: Continue HD on MWF schedule - next HD tomorrow (10/20) with ^ UFG. 3. HTN/volume: Up per weights, and EDW needs to be lowered too - continue to work on this. Cont norvasc.  4. Secondary Hyperparathyroidism: Ca ok, Phos high - resume binders (Auryxia + Renvela) , ^ dose. 5. Anemia of ESRD: Hgb 10.2 - continue Aranesp q Monday. 6. PAD +  prior toe amputations 7. T2DM: per primary.  Veneta Penton, PA-C 08/07/2020, 10:38 AM  [employer]

## 2020-08-07 NOTE — Progress Notes (Addendum)
R / L heart catheterization  Per P. [place]:    Mid LAD lesion is 50% stenosed.  Dist LAD lesion is 80% stenosed.  2nd Diag lesion is 95% stenosed.  Lat 2nd Diag lesion is 100% stenosed.  1st Diag lesion is 90% stenosed.  Ramus lesion is 90% stenosed.  1st Mrg lesion is 95% stenosed.  2nd Mrg lesion is 90% stenosed.  RPAV lesion is 80% stenosed.  Hemodynamic findings consistent with severe pulmonary hypertension.  LV end diastolic pressure is severely elevated.   1. Severe 3 vessel obstructive disease involving multiple branch vessels.  2. Severely elevated LV filling pressures. 30-33 mmHg 3. Severe pulmonary HTN- mean PAP 54 mm Hg 4. Good cardiac output. Index 5.33.    RA 19, RV 77/16   PA 76/39  PCWP 29   LVEDP 28     Plan: need to optimize medical therapy and volume status.  His anatomy is poorly suited for revascularization due to small branch vessel disease. Only the second OM looks suitable for PCI and this alone would not significantly improve his outcome.   Will review with renal.  Needs more drawn off at dialysis   Dry weight is lower.   Needs tighter control of BP  Will increase Coreg furhter    [name] Carnes MD

## 2020-08-07 NOTE — Progress Notes (Addendum)
Progress Note  Patient Name: [name] Date of Encounter: 08/07/2020  Primary Cardiologist: Candee Furbish, MD  Subjective   Denies any further chest pressure or dyspnea, plan for cath today. He has developed a peeling sore on his lower lip associated with some swelling that primary team is investigating. No tongue or airway compromise, no wheezing.  Inpatient Medications    Scheduled Meds: . amLODipine  10 mg Oral Daily  . aspirin EC  81 mg Oral Daily  . atorvastatin  40 mg Oral Daily  . Chlorhexidine Gluconate Cloth  6 each Topical Q0600  . darbepoetin (ARANESP) injection - DIALYSIS  100 mcg Intravenous Q Mon-HD  . [START ON 08/08/2020] doxercalciferol  6 mcg Intravenous Q M,W,F-HD  . [START ON 08/08/2020] famotidine  10 mg Oral Q M,W,F-HD  . ferric citrate  630 mg Oral TID WC  . insulin aspart  0-5 Units Subcutaneous QHS  . insulin aspart  0-6 Units Subcutaneous TID WC  . ramelteon  8 mg Oral QHS  . sevelamer carbonate  800 mg Oral TID WC  . sodium chloride flush  3 mL Intravenous Q12H   Continuous Infusions: . sodium chloride    . sodium chloride 50 mL/hr at 08/07/20 0614   PRN Meds: sodium chloride, acetaminophen, acetaminophen-codeine, melatonin, nitroGLYCERIN, ondansetron (ZOFRAN) IV, sodium chloride flush   Vital Signs    Vitals:   08/06/20 2345 08/07/20 0128 08/07/20 0600 08/07/20 0731  BP: (!) 176/93  (!) 177/91 (!) 170/93  Pulse: 82  77 77  Resp: 18  18 18   Temp: 97.9 F (36.6 C)  97.9 F (36.6 C) 98 F (36.7 C)  TempSrc: Oral  Oral Oral  SpO2:  98% 96% 96%  Weight: 94.3 kg  92.4 kg   Height:        Intake/Output Summary (Last 24 hours) at 08/07/2020 0826 Last data filed at 08/07/2020 0710 Gross per 24 hour  Intake 146.26 ml  Output 3000 ml  Net -2853.74 ml   Last 3 Weights 08/07/2020 08/06/2020 08/06/2020  Weight (lbs) 203 lb 11.3 oz 207 lb 12.8 oz 215 lb 9.8 oz  Weight (kg) 92.4 kg 94.257 kg 97.8 kg     Telemetry    NSR, occasional  PVCs (brief trigeminy yesterday) - Personally Reviewed  Physical Exam   GEN: No acute distress.  HEENT: Normocephalic, atraumatic, sclera non-icteric. Lower lip prominence noted in general with focal lesion left lower lip with associated peeling, nonbleeding. No airway compromise Neck: No JVD or bruits. Cardiac: RRR, 2/6 SEM LSB, no rubs or gallops.   Respiratory: Clear to auscultation bilaterally. Breathing is unlabored. GI: Soft, nontender, non-distended, BS +x 4. MS: normal tone, prior right transmetatarsal amputation Extremities: No clubbing or cyanosis. Trace edema bilaterally with generally taut skin tone Neuro:  AAOx3. Follows commands. Psych:  Responds to questions appropriately with a normal affect.  Labs    High Sensitivity Troponin:   Recent Labs  Lab 08/05/20 0547 08/05/20 0855 08/05/20 1437 08/05/20 1610  TROPONINIHS 31* 31* 27* 27*      Cardiac EnzymesNo results for input(s): TROPONINI in the last 168 hours. No results for input(s): TROPIPOC in the last 168 hours.   Chemistry Recent Labs  Lab 08/05/20 1437 08/06/20 0320 08/07/20 0555  NA 138 140 138  K 5.9* 5.5* 4.2  CL 99 100 97*  CO2 22 21* 27  GLUCOSE 127* 115* 95  BUN 73* 87* 47*  CREATININE 10.58* 11.20* 7.90*  CALCIUM 9.1 9.2 9.2  PROT 7.1  --  6.8  ALBUMIN 3.5 3.4* 3.3*  AST 26  --  25  ALT 28  --  31  ALKPHOS 138*  --  150*  BILITOT 1.4*  --  1.1  GFRNONAA 5* 5* 7*  ANIONGAP 17* 19* 14     Hematology Recent Labs  Lab 08/05/20 0547 08/06/20 0320 08/07/20 0555  WBC 3.2* 3.7* 2.8*  RBC 3.71* 3.64*  3.63* 3.66*  HGB 10.3* 10.2* 10.2*  HCT 33.4* 31.9* 31.5*  MCV 90.0 87.6 86.1  MCH 27.8 28.0 27.9  MCHC 30.8 32.0 32.4  RDW 21.5* 21.2* 20.9*  PLT 99* 96* 98*    BNPNo results for input(s): BNP, PROBNP in the last 168 hours.   DDimer  Recent Labs  Lab 08/05/20 1437  DDIMER 0.61*     Radiology    CT ANGIO CHEST AORTA W/CM & OR WO/CM  Result Date: 08/05/2020 CLINICAL  DATA:  Chest pain for several days EXAM: CT ANGIOGRAPHY CHEST WITH CONTRAST TECHNIQUE: Multidetector CT imaging of the chest was performed using the standard protocol during bolus administration of intravenous contrast. Multiplanar CT image reconstructions and MIPs were obtained to evaluate the vascular anatomy. CONTRAST:  174mL OMNIPAQUE IOHEXOL 350 MG/ML SOLN COMPARISON:  Chest x-ray from earlier in the same day. FINDINGS: Cardiovascular: Initial precontrast images show atherosclerotic calcification without hyperdense crescent to suggest acute aortic injury. Post-contrast images demonstrate the ascending aorta to be within normal limits. No dissection or aneurysmal dilatation is seen. Heart is mildly enlarged. Pulmonary artery shows a normal branching pattern. No definitive filling defect to suggest pulmonary embolism is seen. There is a calcification in the midportion of the right innominate vein likely related to prior dialysis catheter placement and calcified thrombus. This is not flow limiting. Patent stent is noted within the subclavian vein system on the right Mediastinum/Nodes: Thoracic inlet is within normal limits. No sizable hilar or mediastinal adenopathy is noted. The esophagus as visualized is within normal limits. Lungs/Pleura: The lungs are well aerated bilaterally. A few calcified granulomas are noted. Mild dependent atelectatic changes are seen. No sizable parenchymal nodule is noted. Upper Abdomen: Vicarious excretion of contrast is noted within the gallbladder. The liver is diffusely decreased in attenuation which may be related to the timing of the contrast bolus. Very mild ascites is seen surrounding the liver. Musculoskeletal: No chest wall abnormality. No acute or significant osseous findings. Review of the MIP images confirms the above findings. IMPRESSION: No evidence of aortic dissection or aneurysmal dilatation. No large central pulmonary emboli are seen. Mild ascites in the abdomen.  Aortic Atherosclerosis (ICD10-I70.0). Electronically Signed   By: Inez Catalina M.D.   On: 08/05/2020 20:05    Cardiac Studies   2D Echo at [facility] 08/02/20 ECHOCARDIOGRAPHIC MEASUREMENTS -----------------------------------------------  2D DIMENSIONS  AORTA     Values   Normal RangeMAIN PA   Values   Normal Range    Annulus: nm* cm  [2 - 3.2]   PA Main:  2.1 cm  [1.5 - 2.1]   Aorta Sin:  2.7 cm  [2.8 - 4]  RIGHT VENTRICLE  ST Junction: nm* cm  [2.3 - 3.5]  RV Base:  4.8 cm  [2.5 - 4.1]   Asc.Aorta:  2.9 cm  [2.2 - 3.8]   RV Mid:  3.5 cm  [1.9 - 3.5]  LEFT VENTRICLE             RV Length: nm* cm  [ ]      LVIDd:  4.9 cm  [4.2 - 5.8]RIGHT ATRIUM     LVIDs:  3.4 cm  [2.4 - 4]   RA Area: 17  cm2  [ <= 20]    LVEDVi: 81.0 ml/m2 [34 - 74]     RAVi: 22  ml/m2 [11 - 39]    LVESVi: 30.0 ml/m2 [11 - 31]  INFERIOR VENA CAVA      FS: 31  %   [ >= 25]   Max.IVC:  1.8 cm  [ <= 2.1]      SWT:  1.2 cm  [0.6 - 1]   Min.IVC:  1.7 cm  [ <= 1.7]      PWT:  1.2 cm  [0.6 - 1]  __________________  LEFT ATRIUM              nm* - not measured    LA Diam:  4.7 cm  [3- 4]    LA Area: 20  cm2  [ <= 20]   LA Volume: 80  mL  [18 - 58]     LAVi: 38  ml/m2 [16 - 34]   ECHOCARDIOGRAPHIC DESCRIPTIONS -----------------------------------------------  AORTIC ROOT     Size: Normal  Dissection: INDETERM FOR DISSECTION   AORTIC VALVE   Leaflets: Tricuspid       Morphology: MILDLY THICKENED   Mobility: PARTIALLY MOBILE   LEFT VENTRICLE                   Anterior: Normal     Size: Normal                 Lateral: Normal  Contraction: Normal                 Septal: Normal  Closest EF: >55%(Estimated) Calc.EF: 63% (3D)   Apical: Normal   LV masses: No Masses                Inferior: Normal      LVH: MILD LVH CONCENTRIC         Posterior: Normal  LV GLS(LOL): -12.1%  Dias.FxClass: RESTRICTIVE FILLING PATTERN (GRADE 3) CORRESPONDS TO REVERSIBLE        RESTRICTIVE PATTERN   MITRAL VALVE   Leaflets: Normal         Mobility: Fully mobile  Morphology: Normal   LEFT ATRIUM     Size: MILDLY ENLARGED   LA masses: No masses        Normal IAS   MAIN PA     Size: Normal    PA Note: PA acceleration time = 30msec   PULMONIC VALVE  Morphology: Normal   Mobility: Fully Mobile   RIGHT VENTRICLE     Size: MILDLY ENLARGED      Free wall: Normal  Contraction: Normal          RV masses: No Masses     TAPSE:  1.4 cm, Normal Range [>= 1.6 cm]   TRICUSPID VALVE   Leaflets: Normal        Mobility: Fully mobile  Morphology: Normal   RIGHT ATRIUM     Size: Normal           RA Other: None   RA masses: No masses   PERICARDIUM     Fluid: No effusion   INFERIOR VENACAVA     Size: Normal   ABNORMAL RESPIRATORY COLLAPSE   DOPPLER ECHO and OTHER SPECIAL PROCEDURES ------------------------------------   Aortic: No AR  No AS    Mitral: TRIVIAL MR       No MS   MV Inflow E Vel.= 125.0 cm/s MV Annulus E'Vel.= 9.0 cm/s E/E'Ratio= 14   Tricuspid: MODERATE TR      No TS       3.6 m/s peak TR vel  59 mmHg peak RV pressure   Pulmonary: TRIVIAL PR       No PS    Other:   INTERPRETATION ---------------------------------------------------------------  NORMAL LEFT VENTRICULAR SYSTOLIC FUNCTION WITH MILD LVH  ELEVATED LA PRESSURES WITH DIASTOLIC DYSFUNCTION  NORMAL RIGHT VENTRICULAR SYSTOLIC FUNCTION  VALVULAR REGURGITATION: TRIVIAL MR, TRIVIAL PR, MODERATE TR  NO VALVULAR STENOSIS  Aortic valve sclerosis present  NO PRIOR STUDY FOR COMPARISON     Patient Profile     54 y.o. male with history of ESRD on HD, DM with renal manifestations, HTN, HLD, PAD (previous toe amputations, recently being evaluated for L leg percutaneous revascularization). He was previously evaluated in 06/2019 for chest pain and stress test recommended but he did not have this done. He was recently seen by vascular at [facility] pending evaluation for left leg percutaneous revascularization. 2D echo showed normal LVEF, grade 3 DD, moderate TR. He presented to [facility] with chest pressure and progressive DOE, concerning for possible angina.  Assessment & Plan    1. Possible unstable angina (chest pressure/DOE) - marginally elevated troponin of 27-27, not clearly NSTEMI range - CTA 08/05/20 without aortic dissection or central PE - plan for [facility] today given high pretest probability of CAD - continue ASA, add BB, continue statin  2. ESRD on HD MWF - nephrology following  3. PAD - undergoing management at [facility], tentatively was planning angiography on Monday 10/25  4. Pancytopenia - IM following, feels he may require referral to hematology  5. HTN - BP remains elevated, continue amlodipine - could be contributing to symptoms so would advocate for stricter control  - I see carvedilol was started on admission then discontinued - per prior records patient was on 25mg  BID at one point but he indicates he must have stopped it at some point in time. Denies any prior allergic reaction with this. D/w MD - will start carvedilol 6.25mg  BID and titrate as needed  6. HLD - LDL 61 by panel yesterday on atorvastatin  7. Lip lesion - reviewed with Dr. Harrington Challenger via phone - no other focal allergic reaction signs, has previously had contrast in other encounters without adverse effects, OK to continue with plan for cath - IM to evaluate  For questions or updates, please contact [facility] Please consult [email or web address] for contact info under Cardiology/STEMI.  Signed, Charlie Pitter,  PA-C 08/07/2020, 8:26 AM    Patient seen and examined  I agree with findings as noted above by D Dunn.   On exam: Lungs are CTA   Cardiac RRR  No S3 Ext are with trace edema  I am not convinced lip lesion represents contrast allergy  Follow   Plan for cath today. BP is extremely elevated Will start carvedilol    [name] Carnes MD

## 2020-08-07 NOTE — Telephone Encounter (Signed)
Patient is currently in the hospital and is on the schedule for cardiac catheterization today.

## 2020-08-07 NOTE — Progress Notes (Signed)
Report given to Sullivan Lone), RN. Patient is alert and oriented x4 sitting up on side of bed with call light in reach. Bed is locked and in lowest position. Elevated BP noted overnight after hemodialysis. RUA fistula WNL. IVF infusing for upcoming heart cath. Hgb 10.2, platelets 98, lipids WNL, K+ 4.2 and Creat 7.90 this AM.

## 2020-08-07 NOTE — Progress Notes (Signed)
Left sided lower lip swelling noted. Patient states no pain or itching but rather a "tight" feeling. AM RN to page MD to assess and possibly order Benadryl. No tongue swelling or airway compromise noted.

## 2020-08-08 DIAGNOSIS — G43909 Migraine, unspecified, not intractable, without status migrainosus: Secondary | ICD-10-CM

## 2020-08-08 LAB — CBC
HCT: 31.3 % — ABNORMAL LOW (ref 39.0–52.0)
Hemoglobin: 10 g/dL — ABNORMAL LOW (ref 13.0–17.0)
MCH: 27.7 pg (ref 26.0–34.0)
MCHC: 31.9 g/dL (ref 30.0–36.0)
MCV: 86.7 fL (ref 80.0–100.0)
Platelets: 108 10*3/uL — ABNORMAL LOW (ref 150–400)
RBC: 3.61 MIL/uL — ABNORMAL LOW (ref 4.22–5.81)
RDW: 21.1 % — ABNORMAL HIGH (ref 11.5–15.5)
WBC: 3.3 10*3/uL — ABNORMAL LOW (ref 4.0–10.5)
nRBC: 0 % (ref 0.0–0.2)

## 2020-08-08 LAB — RENAL FUNCTION PANEL
Albumin: 3.3 g/dL — ABNORMAL LOW (ref 3.5–5.0)
Anion gap: 14 (ref 5–15)
BUN: 61 mg/dL — ABNORMAL HIGH (ref 6–20)
CO2: 26 mmol/L (ref 22–32)
Calcium: 9.2 mg/dL (ref 8.9–10.3)
Chloride: 99 mmol/L (ref 98–111)
Creatinine, Ser: 9.46 mg/dL — ABNORMAL HIGH (ref 0.61–1.24)
GFR, Estimated: 6 mL/min — ABNORMAL LOW (ref >60)
Glucose, Bld: 96 mg/dL (ref 70–99)
Phosphorus: 8.3 mg/dL — ABNORMAL HIGH (ref 2.5–4.6)
Potassium: 5 mmol/L (ref 3.5–5.1)
Sodium: 139 mmol/L (ref 135–145)

## 2020-08-08 LAB — PATHOLOGIST SMEAR REVIEW

## 2020-08-08 LAB — GLUCOSE, CAPILLARY
Glucose-Capillary: 106 mg/dL — ABNORMAL HIGH (ref 70–99)
Glucose-Capillary: 150 mg/dL — ABNORMAL HIGH (ref 70–99)
Glucose-Capillary: 118 mg/dL — ABNORMAL HIGH (ref 70–99)

## 2020-08-08 LAB — HEPATITIS B SURFACE ANTIBODY, QUANTITATIVE: Hep B S AB Quant (Post): 34.9 m[IU]/mL (ref >9.9)

## 2020-08-08 MED ORDER — HEPARIN SODIUM (PORCINE) 1000 UNIT/ML DIALYSIS
20.0000 [IU]/kg | INTRAMUSCULAR | Status: DC | PRN
  Filled 2020-08-08: qty 2

## 2020-08-08 MED ORDER — ONDANSETRON HCL 4 MG/2ML IJ SOLN
INTRAMUSCULAR | Status: AC
  Filled 2020-08-08: qty 2

## 2020-08-08 MED ORDER — DIPHENHYDRAMINE HCL 50 MG/ML IJ SOLN
INTRAMUSCULAR | Status: AC
  Filled 2020-08-08: qty 1

## 2020-08-08 MED ORDER — CARVEDILOL 25 MG PO TABS
25.0000 mg | ORAL_TABLET | Freq: Two times a day (BID) | ORAL | Status: DC
  Administered 2020-08-08 – 2020-08-09 (×2): 25 mg via ORAL
  Filled 2020-08-08 (×2): qty 1

## 2020-08-08 MED ORDER — ONDANSETRON HCL 4 MG/2ML IJ SOLN
4.0000 mg | Freq: Four times a day (QID) | INTRAMUSCULAR | Status: DC | PRN
  Administered 2020-08-08: 4 mg via INTRAVENOUS

## 2020-08-08 MED ORDER — ASPIRIN-ACETAMINOPHEN-CAFFEINE 250-250-65 MG PO TABS
2.0000 | ORAL_TABLET | Freq: Four times a day (QID) | ORAL | Status: DC | PRN
  Administered 2020-08-08 (×2): 2 via ORAL
  Filled 2020-08-08 (×7): qty 2

## 2020-08-08 MED ORDER — DOXERCALCIFEROL 4 MCG/2ML IV SOLN
INTRAVENOUS | Status: AC
  Administered 2020-08-08: 6 ug via INTRAVENOUS
  Filled 2020-08-08: qty 4

## 2020-08-08 MED ORDER — ONDANSETRON HCL 4 MG/2ML IJ SOLN
INTRAMUSCULAR | Status: AC
  Administered 2020-08-08: 4 mg via INTRAVENOUS
  Filled 2020-08-08: qty 2

## 2020-08-08 NOTE — Progress Notes (Signed)
Subjective: Patient is in HD. Patient reports new migraine that started within the past 10-57min before rounds. Patient reports that these have happened in the past, he just has not had on in a while. The migraine is localized to the left side of the head and behind the eye. Otherwise he does not report chest pain.  Objective:  Vital signs in last 24 hours: Vitals:   08/07/20 2100 08/07/20 2125 08/07/20 2300 08/08/20 0500  BP: (!) 170/90  (!) 173/80 (!) 159/87  Pulse: 69  73 73  Resp: 14  15 17   Temp: 97.6 F (36.4 C)  98.1 F (36.7 C) 98 F (36.7 C)  TempSrc: Oral  Oral Oral  SpO2: 96% 97% 95% 96%  Weight:    92.8 kg  Height:       Physical Exam Constitutional:      Appearance: He is well-developed.  HENT:     Head: Normocephalic and atraumatic.  Cardiovascular:     Rate and Rhythm: Normal rate and regular rhythm.  Pulmonary:     Effort: Pulmonary effort is normal.  Abdominal:     General: Bowel sounds are normal.     Palpations: Abdomen is soft.     Tenderness: There is no abdominal tenderness.  Neurological:     General: No focal deficit present.     Mental Status: He is alert.     Assessment/Plan:  Principal Problem:   Chest pain Active Problems:   ESRD on dialysis ([place])   Type 2 diabetes mellitus ([place])   Pancytopenia ([place])   CAD Patient continues to be hypertensive. Patient has not required pain medication for his chest pain over the past 24h.  He will go to HD today. At this time Card recommends a lower dry weight and tighter control of BP with addition of Coreg. Unclear if patient is candidate for CABG at this time. - Cardiology consulted, appreciate recc's - Recommend patient f/u with his Cardiologist outpatient  - Continue Atorvastatin 40mg  -Nitroglycerin 0.4 PRN x3 is limit - Coreg 12.5 BID  Migraine Patient reports a history of similar headaches. He reports successful abortive therapy with Excedrin and Tylenol. Nephrology is okay with patient  receiving either of these medications while in HD. -Tylenol -Excedrin 2 tablets q6h PRN  HTN Patient continues to be hypertensive with sys 152-217. Patient will require tighter control of BP.  - Continue Amlodipine 10mg   - Start Coreg 12.5 - Cardiology recc's appreciated  PAD Patient has a history of PAD. He is scheduled to have a stent placed in his L leg within the next 2 weeks. On exam today both lower extremities were well perfused. Patient likely is suffering from a ischemia and neuropathy causing his lower leg numbness and pain. This may also be causing patient to have Left side arm pain as well.  - Patient should follow up with his vascular surgery physicians outpatient, scheduled for L leg angiogram OP - Continue ASA  T2DM CBG this AM 96. - SAI ac& qhs  Nausea Patient reports nausea with no emesis. Concern that patient may be developing gastroparesis vs GERD. Na and K were WNL. Will continue to assess.Patient is reporting some reflux. - Zofran 4mg  - Heart Healthy diet - GI cocktail - Pepcid 10mg  MWF after HD   ESRD MWF Patient underwent HD yesterday. Patient is scheduled for HD this AM. Per cardiology recommendations, patient may need extra HD to get lower dry weight. Concern from nephro that patient does not tolerate large  UF at one time. Attempting 4L UF this AM. -Nephro has been consulted. Patient  receiving HD today.  Pancytopenia Patient noted to have low WBC;s, Hgb, and Plton repeat CBC. Baseline WBC appears to be around 6 and Plts tend to be >300. Patient has ESRD likely causing anemia of chronic disease.Concern for Aplastic anemia has increased Retic count is suggests hypoproliferation. Hep B core aband  Hep C ab, was normal. Iron studies suggested anemia of chronic disease as expected. At this time will recommend referral to Hematology OP.  -F/u Blood smear, saved smear -F/u haptoglobin  Prior to Admission Living Arrangement:Home Anticipated  Discharge Location:Home Barriers to Discharge:Treatment Dispo: Anticipated discharge in approximately1-2day(s).  Freida Busman, MD 08/08/2020, 6:36 AM Pager: [phone] After 5pm on weekdays and 1pm on weekends: On Call pager [phone]

## 2020-08-08 NOTE — Progress Notes (Signed)
[facility] Progress Note   Subjective:  Seen on HD today - 4L UFG today and tolerating. He is agreeable for dry weight lowering now given his R/LHC findings. Coreg has been added by cardiology - tolerating. No CP today.  Objective Vitals:   08/08/20 0500 08/08/20 0751 08/08/20 0804 08/08/20 0815  BP: (!) 159/87 (!) 164/76 (!) 153/84 (!) 153/82  Pulse: 73 76    Resp: 17 14  14   Temp: 98 F (36.7 C) 98.4 F (36.9 C)    TempSrc: Oral Oral    SpO2: 96% 97%    Weight: 92.8 kg 93.3 kg    Height:       Physical Exam General: Well appearing man, NAD. Room air. Heart: RRR; 2/6 murmur Lungs: CTAB; no rales Abdomen: soft Extremities: 2+ LE edema, Hx R TMA Dialysis Access: RUE AVF + bruit  Additional Objective Labs: Basic Metabolic Panel: Recent Labs  Lab 08/06/20 0320 08/06/20 0320 08/07/20 0555 08/07/20 0555 08/07/20 0942 08/07/20 0943 08/08/20 0256  NA 140   < > 138   < > 137 137 139  K 5.5*   < > 4.2   < > 4.4 4.4 5.0  CL 100  --  97*  --   --   --  99  CO2 21*  --  27  --   --   --  26  GLUCOSE 115*  --  95  --   --   --  96  BUN 87*  --  47*  --   --   --  61*  CREATININE 11.20*  --  7.90*  --   --   --  9.46*  CALCIUM 9.2  --  9.2  --   --   --  9.2  PHOS 10.9*  --   --   --   --   --  8.3*   < > = values in this interval not displayed.   Liver Function Tests: Recent Labs  Lab 08/05/20 1437 08/05/20 1437 08/06/20 0320 08/07/20 0555 08/08/20 0256  AST 26  --   --  25  --   ALT 28  --   --  31  --   ALKPHOS 138*  --   --  150*  --   BILITOT 1.4*  --   --  1.1  --   PROT 7.1  --   --  6.8  --   ALBUMIN 3.5   < > 3.4* 3.3* 3.3*   < > = values in this interval not displayed.   Recent Labs  Lab 08/05/20 1437  LIPASE 25   CBC: Recent Labs  Lab 08/05/20 0547 08/05/20 0547 08/06/20 0320 08/06/20 0320 08/07/20 0555 08/07/20 0555 08/07/20 0942 08/07/20 0943 08/08/20 0700  WBC 3.2*   < > 3.7*  --  2.8*  --   --   --  3.3*  NEUTROABS   --   --  2.6  --  1.9  --   --   --   --   HGB 10.3*   < > 10.2*   < > 10.2*   < > 10.5* 10.2* 10.0*  HCT 33.4*   < > 31.9*   < > 31.5*   < > 31.0* 30.0* 31.3*  MCV 90.0  --  87.6  --  86.1  --   --   --  86.7  PLT 99*   < > 96*  --  98*  --   --   --  108*   < > = values in this interval not displayed.   Studies/Results: CARDIAC CATHETERIZATION  Result Date: 08/07/2020  Mid LAD lesion is 50% stenosed.  Dist LAD lesion is 80% stenosed.  2nd Diag lesion is 95% stenosed.  Lat 2nd Diag lesion is 100% stenosed.  1st Diag lesion is 90% stenosed.  Ramus lesion is 90% stenosed.  1st Mrg lesion is 95% stenosed.  2nd Mrg lesion is 90% stenosed.  RPAV lesion is 80% stenosed.  Hemodynamic findings consistent with severe pulmonary hypertension.  LV end diastolic pressure is severely elevated.  1. Severe 3 vessel obstructive disease involving multiple branch vessels. 2. Severely elevated LV filling pressures. 30-33 mmHg 3. Severe pulmonary HTN- mean PAP 54 mm Hg 4. Good cardiac output. Index 5.33. Plan: need to optimize medical therapy and volume status.  His anatomy is poorly suited for revascularization due to small branch vessel disease. Only the second OM looks suitable for PCI and this alone would not significantly improve his outcome.   Medications: . sodium chloride     . amLODipine  10 mg Oral Daily  . aspirin EC  81 mg Oral Daily  . atorvastatin  40 mg Oral Daily  . carvedilol  12.5 mg Oral BID WC  . Chlorhexidine Gluconate Cloth  6 each Topical Q0600  . darbepoetin (ARANESP) injection - DIALYSIS  100 mcg Intravenous Q Mon-HD  . doxercalciferol  6 mcg Intravenous Q M,W,F-HD  . famotidine  10 mg Oral Q M,W,F-HD  . ferric citrate  630 mg Oral TID WC  . heparin  5,000 Units Subcutaneous Q8H  . insulin aspart  0-5 Units Subcutaneous QHS  . insulin aspart  0-6 Units Subcutaneous TID WC  . ramelteon  8 mg Oral QHS  . sevelamer carbonate  1,600 mg Oral TID WC  . sodium chloride flush   3 mL Intravenous Q12H   Dialysis Orders: MWF @ [place] 4h 450/ 800 88.5kg 2/2.25 bath Hep 2000 RUA AVF - hect 6 ug - mircera 150ug q 2 wks, last 10/4, due today  Assessment/Plan: 1. Chest pain: CXR/CTA negative for PE but showed diffuse calcifications to coronary arteries. Cardiology consulted - s/p [facility] 10/19 showing severe multivessel CAD + severely pulm HTN and elevated LV filling pressures. Unclear if he is a candidate for CABG? Needs volume offloading - will continue to address with dialysis -> now agreeable to this process. 2. ESRD: Continue HD on MWF schedule - HD today, 4L UFG and tolerating. 3. HTN/volume: Up per weights, and EDW needs to be lowered too - continue to work on this. Cont norvasc, Coreg has been added.  4. Secondary Hyperparathyroidism: Ca ok, Phos high - resume binders (Auryxia + Renvela) , ^ dose. 5. Anemia of ESRD: Hgb 10.2 - continue Aranesp q Monday. 6. PAD + prior toe amputations 7. T2DM: per primary. 8. Leukopenia (actually pancytopenia): Follow, consider heme referral as outpatient.  Veneta Penton, PA-C 08/08/2020, 8:50 AM  [employer]

## 2020-08-08 NOTE — Progress Notes (Signed)
Pt refused medications stating that he just did not feel well. Dr. Court Joy paged and notified of pt's refusal times several attempts. Dr. Court Joy called and spoke with pt and asked this writer to try again in approximately 30-40 minutes to administer medications.

## 2020-08-08 NOTE — Progress Notes (Signed)
Pt. Returned from HD.

## 2020-08-08 NOTE — Progress Notes (Signed)
Dc telemetry per Dr. Court Joy.

## 2020-08-08 NOTE — Progress Notes (Signed)
Report given to Vaughan Basta, RN and Conley Rolls, Therapist, sports. Patient is alert and oriented x4 sitting up in bed with call light in reach. VSS. Patient states no pain or distress at this time. Right groin is a level 0 s/p cath. Plan is for patient to go to hemodialysis this AM then DC home this afternoon or tomorrow morning. K+ 5.0, Creat 9.46 this AM. CBC to be drawn in HD.

## 2020-08-08 NOTE — Progress Notes (Signed)
Progress Note  Patient Name: [name] Date of Encounter: 08/08/2020  Primary Cardiologist: Candee Furbish, MD  Subjective   Pt complains of migraine HA  No SOB at rest.    Inpatient Medications    Scheduled Meds: . amLODipine  10 mg Oral Daily  . aspirin EC  81 mg Oral Daily  . atorvastatin  40 mg Oral Daily  . carvedilol  12.5 mg Oral BID WC  . Chlorhexidine Gluconate Cloth  6 each Topical Q0600  . darbepoetin (ARANESP) injection - DIALYSIS  100 mcg Intravenous Q Mon-HD  . doxercalciferol  6 mcg Intravenous Q M,W,F-HD  . famotidine  10 mg Oral Q M,W,F-HD  . ferric citrate  630 mg Oral TID WC  . heparin  5,000 Units Subcutaneous Q8H  . insulin aspart  0-5 Units Subcutaneous QHS  . insulin aspart  0-6 Units Subcutaneous TID WC  . ondansetron      . ramelteon  8 mg Oral QHS  . sevelamer carbonate  1,600 mg Oral TID WC  . sodium chloride flush  3 mL Intravenous Q12H   Continuous Infusions: . sodium chloride     PRN Meds: sodium chloride, aspirin-acetaminophen-caffeine, diphenhydrAMINE, melatonin, nitroGLYCERIN, ondansetron (ZOFRAN) IV, sodium chloride flush   Vital Signs    Vitals:   08/08/20 0845 08/08/20 0915 08/08/20 0945 08/08/20 1015  BP: (!) 156/82 (!) 156/82 (!) 163/88 (!) 188/101  Pulse: 72 70 76 79  Resp: 16 13 13 15   Temp:      TempSrc:      SpO2:      Weight:      Height:        Intake/Output Summary (Last 24 hours) at 08/08/2020 1045 Last data filed at 08/07/2020 1500 Gross per 24 hour  Intake 260 ml  Output --  Net 260 ml   Last 3 Weights 08/08/2020 08/08/2020 08/07/2020  Weight (lbs) 205 lb 11 oz 204 lb 9.4 oz 203 lb 11.3 oz  Weight (kg) 93.3 kg 92.8 kg 92.4 kg     Telemetry    NSR,- Personally Reviewed  Physical Exam   GEN: No acute distress.  HEENT: Normocephalic, atraumatic, sclera non-icteric.  Neck: No JVD Cardiac: RRR, 2/6 SEM LSB, .   Respiratory: Clear to auscultation bilaterally. Breathing is unlabored. GI: Soft,  nontender, non-distended, BS +x 4. MS: normal tone, prior right transmetatarsal amputation Extremities: No clubbing or cyanosis. Triv edema bilaterally with generally taut skin tone Neuro:  AAOx3. Follows commands. Psych:  Responds to questions appropriately with a normal affect.  Labs    High Sensitivity Troponin:   Recent Labs  Lab 08/05/20 0547 08/05/20 0855 08/05/20 1437 08/05/20 1610  TROPONINIHS 31* 31* 27* 27*      Cardiac EnzymesNo results for input(s): TROPONINI in the last 168 hours. No results for input(s): TROPIPOC in the last 168 hours.   Chemistry Recent Labs  Lab 08/05/20 1437 08/05/20 1437 08/06/20 0320 08/06/20 0320 08/07/20 0555 08/07/20 0555 08/07/20 0942 08/07/20 0943 08/08/20 0256  NA 138   < > 140   < > 138   < > 137 137 139  K 5.9*   < > 5.5*   < > 4.2   < > 4.4 4.4 5.0  CL 99   < > 100  --  97*  --   --   --  99  CO2 22   < > 21*  --  27  --   --   --  26  GLUCOSE 127*   < > 115*  --  95  --   --   --  96  BUN 73*   < > 87*  --  47*  --   --   --  61*  CREATININE 10.58*   < > 11.20*  --  7.90*  --   --   --  9.46*  CALCIUM 9.1   < > 9.2  --  9.2  --   --   --  9.2  PROT 7.1  --   --   --  6.8  --   --   --   --   ALBUMIN 3.5   < > 3.4*  --  3.3*  --   --   --  3.3*  AST 26  --   --   --  25  --   --   --   --   ALT 28  --   --   --  31  --   --   --   --   ALKPHOS 138*  --   --   --  150*  --   --   --   --   BILITOT 1.4*  --   --   --  1.1  --   --   --   --   GFRNONAA 5*   < > 5*  --  7*  --   --   --  6*  ANIONGAP 17*   < > 19*  --  14  --   --   --  14   < > = values in this interval not displayed.     Hematology Recent Labs  Lab 08/06/20 0320 08/06/20 0320 08/07/20 0555 08/07/20 0555 08/07/20 0942 08/07/20 0943 08/08/20 0700  WBC 3.7*  --  2.8*  --   --   --  3.3*  RBC 3.64*  3.63*  --  3.66*  --   --   --  3.61*  HGB 10.2*   < > 10.2*   < > 10.5* 10.2* 10.0*  HCT 31.9*   < > 31.5*   < > 31.0* 30.0* 31.3*  MCV 87.6  --   86.1  --   --   --  86.7  MCH 28.0  --  27.9  --   --   --  27.7  MCHC 32.0  --  32.4  --   --   --  31.9  RDW 21.2*  --  20.9*  --   --   --  21.1*  PLT 96*  --  98*  --   --   --  108*   < > = values in this interval not displayed.    BNPNo results for input(s): BNP, PROBNP in the last 168 hours.   DDimer  Recent Labs  Lab 08/05/20 1437  DDIMER 0.61*     Radiology    CARDIAC CATHETERIZATION  Result Date: 08/07/2020  Mid LAD lesion is 50% stenosed.  Dist LAD lesion is 80% stenosed.  2nd Diag lesion is 95% stenosed.  Lat 2nd Diag lesion is 100% stenosed.  1st Diag lesion is 90% stenosed.  Ramus lesion is 90% stenosed.  1st Mrg lesion is 95% stenosed.  2nd Mrg lesion is 90% stenosed.  RPAV lesion is 80% stenosed.  Hemodynamic findings consistent with severe pulmonary hypertension.  LV end diastolic pressure is severely elevated.  1. Severe 3 vessel obstructive  disease involving multiple branch vessels. 2. Severely elevated LV filling pressures. 30-33 mmHg 3. Severe pulmonary HTN- mean PAP 54 mm Hg 4. Good cardiac output. Index 5.33. Plan: need to optimize medical therapy and volume status.  His anatomy is poorly suited for revascularization due to small branch vessel disease. Only the second OM looks suitable for PCI and this alone would not significantly improve his outcome.    Cardiac Studies   2D Echo at [facility] 08/02/20 ECHOCARDIOGRAPHIC MEASUREMENTS -----------------------------------------------  2D DIMENSIONS  AORTA     Values   Normal RangeMAIN PA   Values   Normal Range    Annulus: nm* cm  [2 - 3.2]   PA Main:  2.1 cm  [1.5 - 2.1]   Aorta Sin:  2.7 cm  [2.8 - 4]  RIGHT VENTRICLE  ST Junction: nm* cm  [2.3 - 3.5]  RV Base:  4.8 cm  [2.5 - 4.1]   Asc.Aorta:  2.9 cm  [2.2 - 3.8]   RV Mid:  3.5 cm  [1.9 - 3.5]  LEFT VENTRICLE             RV Length: nm* cm  [ ]      LVIDd:  4.9 cm  [4.2 -  5.8]RIGHT ATRIUM     LVIDs:  3.4 cm  [2.4 - 4]   RA Area: 17  cm2  [ <= 20]    LVEDVi: 81.0 ml/m2 [34 - 74]     RAVi: 22  ml/m2 [11 - 39]    LVESVi: 30.0 ml/m2 [11 - 31]  INFERIOR VENA CAVA      FS: 31  %   [ >= 25]   Max.IVC:  1.8 cm  [ <= 2.1]      SWT:  1.2 cm  [0.6 - 1]   Min.IVC:  1.7 cm  [ <= 1.7]      PWT:  1.2 cm  [0.6 - 1]  __________________  LEFT ATRIUM              nm* - not measured    LA Diam:  4.7 cm  [3- 4]    LA Area: 20  cm2  [ <= 20]   LA Volume: 80  mL  [18 - 58]     LAVi: 38  ml/m2 [16 - 34]   ECHOCARDIOGRAPHIC DESCRIPTIONS -----------------------------------------------  AORTIC ROOT     Size: Normal  Dissection: INDETERM FOR DISSECTION   AORTIC VALVE   Leaflets: Tricuspid       Morphology: MILDLY THICKENED   Mobility: PARTIALLY MOBILE   LEFT VENTRICLE                   Anterior: Normal     Size: Normal                 Lateral: Normal  Contraction: Normal                 Septal: Normal  Closest EF: >55%(Estimated) Calc.EF: 63% (3D)   Apical: Normal   LV masses: No Masses               Inferior: Normal      LVH: MILD LVH CONCENTRIC         Posterior: Normal  LV GLS(LOL): -12.1%  Dias.FxClass: RESTRICTIVE FILLING PATTERN (GRADE 3) CORRESPONDS TO REVERSIBLE        RESTRICTIVE PATTERN   MITRAL VALVE   Leaflets: Normal  Mobility: Fully mobile  Morphology: Normal   LEFT ATRIUM     Size: MILDLY ENLARGED   LA masses: No masses        Normal IAS   MAIN PA     Size: Normal    PA Note: PA acceleration time = 21msec   PULMONIC VALVE  Morphology: Normal   Mobility: Fully Mobile   RIGHT VENTRICLE     Size: MILDLY ENLARGED      Free wall: Normal  Contraction: Normal           RV masses: No Masses     TAPSE:  1.4 cm, Normal Range [>= 1.6 cm]   TRICUSPID VALVE   Leaflets: Normal        Mobility: Fully mobile  Morphology: Normal   RIGHT ATRIUM     Size: Normal           RA Other: None   RA masses: No masses   PERICARDIUM     Fluid: No effusion   INFERIOR VENACAVA     Size: Normal   ABNORMAL RESPIRATORY COLLAPSE   DOPPLER ECHO and OTHER SPECIAL PROCEDURES ------------------------------------   Aortic: No AR         No AS    Mitral: TRIVIAL MR       No MS   MV Inflow E Vel.= 125.0 cm/s MV Annulus E'Vel.= 9.0 cm/s E/E'Ratio= 14   Tricuspid: MODERATE TR      No TS       3.6 m/s peak TR vel  59 mmHg peak RV pressure   Pulmonary: TRIVIAL PR       No PS    Other:   INTERPRETATION ---------------------------------------------------------------  NORMAL LEFT VENTRICULAR SYSTOLIC FUNCTION WITH MILD LVH  ELEVATED LA PRESSURES WITH DIASTOLIC DYSFUNCTION  NORMAL RIGHT VENTRICULAR SYSTOLIC FUNCTION  VALVULAR REGURGITATION: TRIVIAL MR, TRIVIAL PR, MODERATE TR  NO VALVULAR STENOSIS  Aortic valve sclerosis present  NO PRIOR STUDY FOR COMPARISON    Patient Profile     54 y.o. male with history of ESRD on HD, DM with renal manifestations, HTN, HLD, PAD (previous toe amputations, recently being evaluated for L leg percutaneous revascularization). He was previously evaluated in 06/2019 for chest pain and stress test recommended but he did not have this done. He was recently seen by vascular at [facility] pending evaluation for left leg percutaneous revascularization. 2D echo showed normal LVEF, grade 3 DD, moderate TR. He presented to [facility] with chest pressure and progressive DOE, concerning for possible angina.  Assessment & Plan    1. CAD  Pt presented with CP/PND  Went on to have R and L heart cath yesterday  Results above   Pt with severe 3V CAD with  signif dz in branches, not amenable to revascularlizatoin.   R heart cath showed severely elevated filling pressures and pulmonary HTN    WOuld recomm medical Rx with improved control of BP and increased volume removal Pt in dialysis now    2. ESRD on HD MWF - nephrology following  3. PAD - undergoing management at [facility], tentatively was planning angiography on Monday 10/25  With dz noted, pt would be at high risk for periop complication if open procedure done     4. HTN - BP remains elevated   On amlodipine and corege    WIll increase coreg Volume removal at dialysis shouldhhelp  COnsider hydralazine     6. HLD - LDL 61 by panel yesterday on atorvastatin   For  questions or updates, please contact [place] Please consult [email or web address] for contact info under Cardiology/STEMI.  Signed, [name] Carnes, MD 08/08/2020, 10:45 AM

## 2020-08-08 NOTE — Telephone Encounter (Signed)
   Primary Cardiologist: Candee Furbish, MD  Chart reviewed as part of pre-operative protocol coverage.   This patient was initially scheduled to undergo nuclear stress test for preop clearance. However, he presented to [facility] with chest tightness and PND. He underwent heart cath yesterday 08/07/20 that showed severe 3 vessel obstructive disease with no targets for revascularization. 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  In consultation with Dr. Harrington Challenger, the rounding MD today, he is at high risk for procedures at this point, especially if open procedure is required.   Cath report 08/07/20:  Mid LAD lesion is 50% stenosed.  Dist LAD lesion is 80% stenosed.  2nd Diag lesion is 95% stenosed.  Lat 2nd Diag lesion is 100% stenosed.  1st Diag lesion is 90% stenosed.  Ramus lesion is 90% stenosed.  1st Mrg lesion is 95% stenosed.  2nd Mrg lesion is 90% stenosed.  RPAV lesion is 80% stenosed.  Hemodynamic findings consistent with severe pulmonary hypertension.  LV end diastolic pressure is severely elevated.   1. Severe 3 vessel obstructive disease involving multiple branch vessels.  2. Severely elevated LV filling pressures. 30-33 mmHg 3. Severe pulmonary HTN- mean PAP 54 mm Hg 4. Good cardiac output. Index 5.33.   Plan: need to optimize medical therapy and volume status.  His anatomy is poorly suited for revascularization due to small branch vessel disease. Only the second OM looks suitable for PCI and this alone would not significantly improve his outcome.   Diagnostic Dominance: Right      I will route this recommendation to the requesting party via Epic fax function and remove from pre-op pool. Please call with questions.  Tami Lin Norie Latendresse, PA 08/08/2020, 10:31 AM

## 2020-08-08 NOTE — Progress Notes (Signed)
Pt.taken to HD at 7:40a

## 2020-08-09 DIAGNOSIS — Z992 Dependence on renal dialysis: Secondary | ICD-10-CM | POA: Diagnosis not present

## 2020-08-09 DIAGNOSIS — G8918 Other acute postprocedural pain: Secondary | ICD-10-CM | POA: Diagnosis not present

## 2020-08-09 DIAGNOSIS — I2510 Atherosclerotic heart disease of native coronary artery without angina pectoris: Secondary | ICD-10-CM | POA: Insufficient documentation

## 2020-08-09 DIAGNOSIS — Z9861 Coronary angioplasty status: Secondary | ICD-10-CM | POA: Diagnosis not present

## 2020-08-09 DIAGNOSIS — L97525 Non-pressure chronic ulcer of other part of left foot with muscle involvement without evidence of necrosis: Secondary | ICD-10-CM | POA: Diagnosis not present

## 2020-08-09 DIAGNOSIS — I272 Pulmonary hypertension, unspecified: Secondary | ICD-10-CM | POA: Insufficient documentation

## 2020-08-09 DIAGNOSIS — N186 End stage renal disease: Secondary | ICD-10-CM | POA: Diagnosis not present

## 2020-08-09 DIAGNOSIS — Z20822 Contact with and (suspected) exposure to covid-19: Secondary | ICD-10-CM | POA: Diagnosis not present

## 2020-08-09 DIAGNOSIS — E1122 Type 2 diabetes mellitus with diabetic chronic kidney disease: Secondary | ICD-10-CM | POA: Diagnosis not present

## 2020-08-09 DIAGNOSIS — I9789 Other postprocedural complications and disorders of the circulatory system, not elsewhere classified: Secondary | ICD-10-CM | POA: Diagnosis not present

## 2020-08-09 DIAGNOSIS — R1031 Right lower quadrant pain: Secondary | ICD-10-CM | POA: Diagnosis not present

## 2020-08-09 DIAGNOSIS — Z7901 Long term (current) use of anticoagulants: Secondary | ICD-10-CM | POA: Diagnosis not present

## 2020-08-09 DIAGNOSIS — T82838A Hemorrhage of vascular prosthetic devices, implants and grafts, initial encounter: Secondary | ICD-10-CM | POA: Diagnosis not present

## 2020-08-09 LAB — RENAL FUNCTION PANEL
Albumin: 3.1 g/dL — ABNORMAL LOW (ref 3.5–5.0)
Anion gap: 13 (ref 5–15)
BUN: 36 mg/dL — ABNORMAL HIGH (ref 6–20)
CO2: 26 mmol/L (ref 22–32)
Calcium: 9.4 mg/dL (ref 8.9–10.3)
Chloride: 99 mmol/L (ref 98–111)
Creatinine, Ser: 7.56 mg/dL — ABNORMAL HIGH (ref 0.61–1.24)
GFR, Estimated: 7 mL/min — ABNORMAL LOW (ref >60)
Glucose, Bld: 93 mg/dL (ref 70–99)
Phosphorus: 7.1 mg/dL — ABNORMAL HIGH (ref 2.5–4.6)
Potassium: 4.7 mmol/L (ref 3.5–5.1)
Sodium: 138 mmol/L (ref 135–145)

## 2020-08-09 LAB — CBC
HCT: 31.6 % — ABNORMAL LOW (ref 39.0–52.0)
Hemoglobin: 9.9 g/dL — ABNORMAL LOW (ref 13.0–17.0)
MCH: 27.6 pg (ref 26.0–34.0)
MCHC: 31.3 g/dL (ref 30.0–36.0)
MCV: 88 fL (ref 80.0–100.0)
Platelets: 113 10*3/uL — ABNORMAL LOW (ref 150–400)
RBC: 3.59 MIL/uL — ABNORMAL LOW (ref 4.22–5.81)
RDW: 20.3 % — ABNORMAL HIGH (ref 11.5–15.5)
WBC: 2.4 10*3/uL — ABNORMAL LOW (ref 4.0–10.5)
nRBC: 0 % (ref 0.0–0.2)

## 2020-08-09 LAB — HAPTOGLOBIN: Haptoglobin: 111 mg/dL (ref 29–370)

## 2020-08-09 LAB — GLUCOSE, CAPILLARY: Glucose-Capillary: 89 mg/dL (ref 70–99)

## 2020-08-09 MED ORDER — HYDRALAZINE HCL 25 MG PO TABS
25.0000 mg | ORAL_TABLET | Freq: Three times a day (TID) | ORAL | Status: DC
  Administered 2020-08-09: 25 mg via ORAL
  Filled 2020-08-09: qty 1

## 2020-08-09 MED ORDER — PROPRANOLOL HCL 80 MG PO TABS
80.0000 mg | ORAL_TABLET | Freq: Two times a day (BID) | ORAL | Status: DC
  Filled 2020-08-09 (×2): qty 1

## 2020-08-09 MED ORDER — HYDRALAZINE HCL 25 MG PO TABS: 25.0000 mg | ORAL_TABLET | Freq: Three times a day (TID) | ORAL | 0 refills | Status: DC

## 2020-08-09 MED ORDER — PROPRANOLOL HCL 80 MG PO TABS
80.0000 mg | ORAL_TABLET | Freq: Two times a day (BID) | ORAL | 0 refills | Status: DC
Start: 2020-08-09 — End: 2020-09-26

## 2020-08-09 MED FILL — Heparin Sodium (Porcine) Inj 1000 Unit/ML: INTRAMUSCULAR | Qty: 10 | Status: AC

## 2020-08-09 NOTE — Discharge Summary (Addendum)
Name: [name] MRN: [id number] DOB: 1965/12/07 54 y.o. PCP: No primary care provider on file.  Date of Admission: 08/05/2020  5:29 AM Date of Discharge: 08/09/2020 Attending Physician: Dr. Lenice Pressman  Discharge Diagnosis: Principal Problem:   Chest pain Active Problems:   ESRD on dialysis [facility])   Type 2 diabetes mellitus ([place])   Pancytopenia ([place])   Discharge Medications: Allergies as of 08/09/2020       Reactions   Amoxicillin Itching   Did it involve swelling of the face/tongue/throat, SOB, or low BP? Unknown Did it involve sudden or severe rash/hives, skin peeling, or any reaction on the inside of your mouth or nose? Unknown Did you need to seek medical attention at a hospital or doctor's office? Unknown When did it last happen?      20 years ago If all above answers are "NO", may proceed with cephalosporin use.   Hydromorphone Itching   Patient states he may take with benadryl. Makes feet itch.   Percocet [oxycodone-acetaminophen] Itching, Other (See Comments)   Legs only. Itching and burning feeling.   Chlorhexidine Hives   Patient has blistering   Gabapentin Nausea And Vomiting   Stomach issues         Medication List     STOP taking these medications    HYDROcodone-acetaminophen 10-325 MG tablet Commonly known as: NORCO       TAKE these medications    Accu-Chek Aviva Plus test strip Generic drug: glucose blood Use as instructed   accu-chek softclix lancets Use as instructed   amLODipine 10 MG tablet Commonly known as: NORVASC Take 10 mg by mouth at bedtime.   aspirin EC 81 MG tablet Take 1 tablet (81 mg total) by mouth daily.   atorvastatin 40 MG tablet Commonly known as: LIPITOR Take 1 tablet (40 mg total) by mouth daily.   Auryxia 1 GM 210 MG(Fe) tablet Generic drug: ferric citrate Take 210-630 mg by mouth See admin instructions. Take 3 tablets (630 mg) by mouth up to three times daily with meals, take 1 tablet (210 mg)  with snacks   blood glucose meter kit and supplies Dispense based on patient and insurance preference. Use up to four times daily as directed. (FOR ICD-10 E10.9, E11.9).   blood glucose meter kit and supplies Dispense based on patient and insurance preference. Use up to four times daily as directed. (FOR ICD-9 250.00, 250.01).   FreeStyle [employer] 14 Day Sensor Misc See admin instructions.   hydrALAZINE 25 MG tablet Commonly known as: APRESOLINE Take 1 tablet (25 mg total) by mouth every 8 (eight) hours. Do not take morning of dialysis.   Melatonin 5 MG Caps Take 10 mg by mouth at bedtime as needed (sleep).   mupirocin nasal ointment 2 % Commonly known as: BACTROBAN Apply small amount to lip 1 - 2 x a day   propranolol 80 MG tablet Commonly known as: INDERAL Take 1 tablet (80 mg total) by mouth 2 (two) times daily.   sevelamer carbonate 800 MG tablet Commonly known as: RENVELA Take 800 mg by mouth 3 (three) times daily with meals. Just takes with meals        Disposition and follow-up:   Mr.[name] was discharged from [facility] in Stable condition.  At the hospital follow up visit please address:  1.  Please address patient new onset pancytopenia. - Please reassess patient CAD and PAD. - Please follow-up patient BP and his compliance with his  new antihypertensives. - Please reassess patient's night time awakenings due to "sudden loss of breath."  2.  Labs / imaging needed at time of follow-up: None  3.  Pending labs/ test needing follow-up: None  Follow-up Appointments:  Recommend referral to Hematology for new onset Pancytopenia workup.  Patient should follow-up with his Cardiologist. He may need CABG in the future.  Hospital Course by problem list: [name] is a 54 yo male with history of end-stage renal disease on hemodialysis, high cholesterol, hypertension, diabetes who presents to the hospital with chest pain.  CAD Patient  presented with significant chest pain. ACS workup was negative. Patient continued on atorvastatin. Cardiology was consulted. Patient taking to cath for R and LHC and found to have severe 3 vessel obstructive disease with no targets for revascularization. Patient started on carvedilol and hydralazine per cardiology rec's. Carvedilol was changed to propanolol at discharge to attempt to also provide migraine prophylaxis.  Migraine Patient had migraine during HD when nephro attempted to pull 4L off patient. Patient had emesis associated with his migraine. Patient received Excedrin for the migraine.  HTN Patient continued on home medication amlodipine and remained hypertensive. After R and LHC cardiology recommended tighter control of BP and added hydralazine and carvedilol. Carvedilol was later changed to Propanolol at discharge.  PAD Patient reported previous stent in his R leg and had an upcoming stent scheduled for his left leg. Due to new CAD diagnosis Cardiology spoke with patient OP Cardiologist who canceled patient's upcoming left leg cath.   T2DM Patient did not require insulin inpatient.  Nausea Patient reported significant nausea with his chest pain. Placed on Zofran. Patient also noted reflux and was provided Pepcid.   ESRD MWF Patient dialyzed according to schedule. Cardiology recommended that due to patient's severe vessel disease his dry weight should be decreased. Attempted to increase UF to pull 4L off; however patient was unable to handle it. Recommend gradually increasing UF.  Incidental Pancytopenia Patient noted to have new onset pancytopenia. Workup was concerning for Aplastic anemia. Reticulocyte count Pct 1.1.Recommend that patient be referred to Hematology.   Discharge Vitals:   BP (!) 161/87 (BP Location: Left Arm)   Pulse 65   Temp 97.9 F (36.6 C) (Oral)   Resp 18   Ht '5\' 10"'  (1.778 m)   Wt 90.6 kg   SpO2 98%   BMI 28.66 kg/m   Pertinent Labs, Studies, and  Procedures: Reticulocytes Ct Pct:1.1 Hep B surface antigen (-) Hep B surface ab (-) Hep B core ab: (-) Hep C ab:  (-) Iron and TIBC: 41, 196 respectively Smear Review: Mild thrombocytopenia. Mild normocytic anemia with anisocytosis.  Haptoglobin: 111 LDH: 167  Lipid Panel     Component Value Date/Time   CHOL 167 08/07/2020 0555   TRIG 73 08/07/2020 0555   HDL 91 08/07/2020 0555   CHOLHDL 1.8 08/07/2020 0555   VLDL 15 08/07/2020 0555   LDLCALC 61 08/07/2020 0555   LDLDIRECT 166.1 11/13/2008 1202   DG Chest 2 View  Result Date: 08/05/2020 CLINICAL DATA:  Chest pain EXAM: CHEST - 2 VIEW COMPARISON:  08/05/2019 FINDINGS: Lungs are clear.  No pleural effusion or pneumothorax. The heart is normal in size. Visualized osseous structures are within normal limits. IMPRESSION: Normal chest radiographs. Electronically Signed   By: Julian Hy M.D.   On: 08/05/2020 06:10   CARDIAC CATHETERIZATION  Result Date: 08/07/2020  Mid LAD lesion is 50% stenosed.  Dist LAD lesion is 80% stenosed.  2nd Diag lesion is 95% stenosed.  Lat 2nd Diag lesion is 100% stenosed.  1st Diag lesion is 90% stenosed.  Ramus lesion is 90% stenosed.  1st Mrg lesion is 95% stenosed.  2nd Mrg lesion is 90% stenosed.  RPAV lesion is 80% stenosed.  Hemodynamic findings consistent with severe pulmonary hypertension.  LV end diastolic pressure is severely elevated.  1. Severe 3 vessel obstructive disease involving multiple branch vessels. 2. Severely elevated LV filling pressures. 30-33 mmHg 3. Severe pulmonary HTN- mean PAP 54 mm Hg 4. Good cardiac output. Index 5.33. Plan: need to optimize medical therapy and volume status.  His anatomy is poorly suited for revascularization due to small branch vessel disease. Only the second OM looks suitable for PCI and this alone would not significantly improve his outcome.   CT ANGIO CHEST AORTA W/CM & OR WO/CM  Result Date: 08/05/2020 CLINICAL DATA:  Chest pain for  several days EXAM: CT ANGIOGRAPHY CHEST WITH CONTRAST TECHNIQUE: Multidetector CT imaging of the chest was performed using the standard protocol during bolus administration of intravenous contrast. Multiplanar CT image reconstructions and MIPs were obtained to evaluate the vascular anatomy. CONTRAST:  182m OMNIPAQUE IOHEXOL 350 MG/ML SOLN COMPARISON:  Chest x-ray from earlier in the same day. FINDINGS: Cardiovascular: Initial precontrast images show atherosclerotic calcification without hyperdense crescent to suggest acute aortic injury. Post-contrast images demonstrate the ascending aorta to be within normal limits. No dissection or aneurysmal dilatation is seen. Heart is mildly enlarged. Pulmonary artery shows a normal branching pattern. No definitive filling defect to suggest pulmonary embolism is seen. There is a calcification in the midportion of the right innominate vein likely related to prior dialysis catheter placement and calcified thrombus. This is not flow limiting. Patent stent is noted within the subclavian vein system on the right Mediastinum/Nodes: Thoracic inlet is within normal limits. No sizable hilar or mediastinal adenopathy is noted. The esophagus as visualized is within normal limits. Lungs/Pleura: The lungs are well aerated bilaterally. A few calcified granulomas are noted. Mild dependent atelectatic changes are seen. No sizable parenchymal nodule is noted. Upper Abdomen: Vicarious excretion of contrast is noted within the gallbladder. The liver is diffusely decreased in attenuation which may be related to the timing of the contrast bolus. Very mild ascites is seen surrounding the liver. Musculoskeletal: No chest wall abnormality. No acute or significant osseous findings. Review of the MIP images confirms the above findings. IMPRESSION: No evidence of aortic dissection or aneurysmal dilatation. No large central pulmonary emboli are seen. Mild ascites in the abdomen. Aortic Atherosclerosis  (ICD10-I70.0). Electronically Signed   By: MInez CatalinaM.D.   On: 08/05/2020 20:05    Discharge Instructions: Discharge Instructions     Diet - low sodium heart healthy   Complete by: As directed    Increase activity slowly   Complete by: As directed    No wound care   Complete by: As directed        Signed: MFreida Busman MD 08/09/2020, 5:42 PM   Pager: [phone]

## 2020-08-09 NOTE — Progress Notes (Signed)
Report given to [facility], [place]. Patient is alert and oriented x4 resting in bed with call light in reach. VSS. Patient states no pain. Oozing noted to right femoral site overnight and manual pressure held x5 minutes.  Plan is to DC home today. Hgb 9.9, platelets 113, K+ 4.7 and Creat 7.56 this AM.

## 2020-08-09 NOTE — Progress Notes (Addendum)
[facility] Progress Note   Subjective:  Seen in room. No dyspnea today. Has been counseled on need for better BP control and need for get EDW down -- agreeable for this, but wants to go slow. Had rough time with HD yesterday - had migraine with vomiting. Says often happens in Autumn months - usually if takes Excedrin quickly he can stop them, but couldn't get med from pharmacy fast enough yesterday. Only got 1.7L UF with HD yesterday.  Objective Vitals:   08/09/20 0025 08/09/20 0440 08/09/20 0600 08/09/20 0846  BP: (!) 154/84 (!) 153/89  (!) 161/87  Pulse: 70 69  65  Resp: 16 16 16 18   Temp: 98.2 F (36.8 C) 97.9 F (36.6 C)    TempSrc: Oral Oral    SpO2: 97% 98%    Weight:  90.6 kg    Height:       Physical Exam General:Well appearing man, NAD. Room air. Heart:RRR; 2/6 murmur Lungs:CTAB; no rales Abdomen:soft Extremities:2+ LE edema, Hx R TMA Dialysis Access:RUE AVF + bruit  Additional Objective Labs: Basic Metabolic Panel: Recent Labs  Lab 08/06/20 0320 08/06/20 0320 08/07/20 0555 08/07/20 0942 08/07/20 0943 08/08/20 0256 08/09/20 0550  NA 140   < > 138   < > 137 139 138  K 5.5*   < > 4.2   < > 4.4 5.0 4.7  CL 100   < > 97*  --   --  99 99  CO2 21*   < > 27  --   --  26 26  GLUCOSE 115*   < > 95  --   --  96 93  BUN 87*   < > 47*  --   --  61* 36*  CREATININE 11.20*   < > 7.90*  --   --  9.46* 7.56*  CALCIUM 9.2   < > 9.2  --   --  9.2 9.4  PHOS 10.9*  --   --   --   --  8.3* 7.1*   < > = values in this interval not displayed.   Liver Function Tests: Recent Labs  Lab 08/05/20 1437 08/06/20 0320 08/07/20 0555 08/08/20 0256 08/09/20 0550  AST 26  --  25  --   --   ALT 28  --  31  --   --   ALKPHOS 138*  --  150*  --   --   BILITOT 1.4*  --  1.1  --   --   PROT 7.1  --  6.8  --   --   ALBUMIN 3.5   < > 3.3* 3.3* 3.1*   < > = values in this interval not displayed.   Recent Labs  Lab 08/05/20 1437  LIPASE 25   CBC: Recent  Labs  Lab 08/05/20 0547 08/05/20 0547 08/06/20 0320 08/06/20 0320 08/07/20 0555 08/07/20 0942 08/07/20 0943 08/08/20 0700 08/09/20 0550  WBC 3.2*   < > 3.7*   < > 2.8*  --   --  3.3* 2.4*  NEUTROABS  --   --  2.6  --  1.9  --   --   --   --   HGB 10.3*   < > 10.2*   < > 10.2*   < > 10.2* 10.0* 9.9*  HCT 33.4*   < > 31.9*   < > 31.5*   < > 30.0* 31.3* 31.6*  MCV 90.0  --  87.6  --  86.1  --   --  86.7 88.0  PLT 99*   < > 96*   < > 98*  --   --  108* 113*   < > = values in this interval not displayed.   CBG: Recent Labs  Lab 08/07/20 2122 08/08/20 1411 08/08/20 1746 08/08/20 2147 08/09/20 0754  GLUCAP 167* 106* 150* 118* 89   Iron Studies:  Recent Labs    08/06/20 1938  IRON 41*  TIBC 196*   Medications: . sodium chloride     . amLODipine  10 mg Oral Daily  . aspirin EC  81 mg Oral Daily  . atorvastatin  40 mg Oral Daily  . Chlorhexidine Gluconate Cloth  6 each Topical Q0600  . darbepoetin (ARANESP) injection - DIALYSIS  100 mcg Intravenous Q Mon-HD  . doxercalciferol  6 mcg Intravenous Q M,W,F-HD  . famotidine  10 mg Oral Q M,W,F-HD  . ferric citrate  630 mg Oral TID WC  . heparin  5,000 Units Subcutaneous Q8H  . hydrALAZINE  25 mg Oral Q8H  . insulin aspart  0-5 Units Subcutaneous QHS  . insulin aspart  0-6 Units Subcutaneous TID WC  . propranolol  80 mg Oral BID  . ramelteon  8 mg Oral QHS  . sevelamer carbonate  1,600 mg Oral TID WC  . sodium chloride flush  3 mL Intravenous Q12H    Dialysis Orders: MWF @ [facility] 4h 450/ 800 88.5kg 2/2.25 bath Hep 2000 RUA AVF - hect 6 ug - mircera 150ug q 2 wks, last 10/4, due today  Assessment/Plan: 1. Chest pain: CXR/CTA negative for PE but showed diffuse calcifications to coronary arteries. Cardiology consulted - s/p [facility] 10/19 showing severe multivessel CAD + severely pulm HTN and elevated LV filling pressures. Needs volume offloading - will continue to address with dialysis -> now agreeable to this  process, plan gradual lowering of edw in the OP setting.  2. ESRD: Continue HD on MWF schedule - next HD 10/22. 3. HTN/volume: Up per weights, and EDW needs to be lowered too - continue to work on this.Cont norvasc, Coreg + hydralazine have been added - may have to taper back on meds once volume corrected. 4. Secondary Hyperparathyroidism: Ca ok, Phos high - resume binders (Auryxia + Renvela) , ^ dose. 5. Anemiaof ESRD: Hgb 10.2 - continue Aranesp q Monday. 6. PAD+ prior toe amputations 7. T2DM: per primary. 8. Leukopenia (actually pancytopenia): Follow, consider heme referral as outpatient. 9. Dispo: Ok to discharge from renal standpoint, can continue to work on volume/BP as outpatient.  Veneta Penton, PA-C 08/09/2020, 11:45 AM  [facility]  Pt seen, examined and agree w A/P as above.  Kelly Splinter  MD 08/09/2020, 3:03 PM

## 2020-08-09 NOTE — Telephone Encounter (Signed)
Agree.  Lets hold off on further procedures until medically stable. Candee Furbish, MD

## 2020-08-09 NOTE — Discharge Instructions (Signed)
Femoral Site Care This sheet gives you information about how to care for yourself after your procedure. Your health care provider may also give you more specific instructions. If you have problems or questions, contact your health care provider. What can I expect after the procedure? After the procedure, it is common to have:  Bruising that usually fades within 1-2 weeks.  Tenderness at the site. Follow these instructions at home: Wound care  Follow instructions from your health care provider about how to take care of your insertion site. Make sure you: ? Wash your hands with soap and water before you change your bandage (dressing). If soap and water are not available, use hand sanitizer. ? Change your dressing as told by your health care provider. ? Leave stitches (sutures), skin glue, or adhesive strips in place. These skin closures may need to stay in place for 2 weeks or longer. If adhesive strip edges start to loosen and curl up, you may trim the loose edges. Do not remove adhesive strips completely unless your health care provider tells you to do that.  Do not take baths, swim, or use a hot tub until your health care provider approves.  You may shower 24-48 hours after the procedure or as told by your health care provider. ? Gently wash the site with plain soap and water. ? Pat the area dry with a clean towel. ? Do not rub the site. This may cause bleeding.  Do not apply powder or lotion to the site. Keep the site clean and dry.  Check your femoral site every day for signs of infection. Check for: ? Redness, swelling, or pain. ? Fluid or blood. ? Warmth. ? Pus or a bad smell. Activity  For the first 2-3 days after your procedure, or as long as directed: ? Avoid climbing stairs as much as possible. ? Do not squat.  Do not lift anything that is heavier than 10 lb (4.5 kg), or the limit that you are told, until your health care provider says that it is safe.  Rest as  directed. ? Avoid sitting for a long time without moving. Get up to take short walks every 1-2 hours.  Do not drive for 24 hours if you were given a medicine to help you relax (sedative). General instructions  Take over-the-counter and prescription medicines only as told by your health care provider.  Keep all follow-up visits as told by your health care provider. This is important. Contact a health care provider if you have:  A fever or chills.  You have redness, swelling, or pain around your insertion site. Get help right away if:  The catheter insertion area swells very fast.  You pass out.  You suddenly start to sweat or your skin gets clammy.  The catheter insertion area is bleeding, and the bleeding does not stop when you hold steady pressure on the area.  The area near or just beyond the catheter insertion site becomes pale, cool, tingly, or numb. These symptoms may represent a serious problem that is an emergency. Do not wait to see if the symptoms will go away. Get medical help right away. Call your local emergency services (911 in the [place] Do not drive yourself to the hospital. Summary  After the procedure, it is common to have bruising that usually fades within 1-2 weeks.  Check your femoral site every day for signs of infection.  Do not lift anything that is heavier than 10 lb (4.5 kg), or the   limit that you are told, until your health care provider says that it is safe. This information is not intended to replace advice given to you by your health care provider. Make sure you discuss any questions you have with your health care provider. Document Revised: 10/19/2017 Document Reviewed: 10/19/2017 Elsevier Patient Education  2020 [place]. Dear [name],   Thank you so much for allowing [place] to be part of your care!  You were admitted to [facility] for chest pain.   POST-HOSPITAL & CARE INSTRUCTIONS 1. Please limit you intake to 1L of fluids  daily. 2. Please continue taking your new medication  Propanolol 80mg  2x / day and Hydralazine 25mg  every 8hrs.  DO NOT TAKE THE HYDRALAZINE MORNING OF DIALYSIS. 3.  4. Continue to take you amlodipine 10mg  daily. 5. We do recommend colonoscopy and Covid vaccine.  6. Please let PCP/Specialists know of any changes that were made.  7. Please see medications section of this packet for any medication changes.   DOCTOR'S APPOINTMENT & FOLLOW UP CARE INSTRUCTIONS  No future appointments.  RETURN PRECAUTIONS:   Take care and be well!  [facility]  [address] [place], [place] [address] [phone]

## 2020-08-09 NOTE — Progress Notes (Addendum)
Progress Note  Patient Name: [name] Date of Encounter: 08/09/2020  Primary Cardiologist: Candee Furbish, MD  Subjective   Breathing ok, willing to take hydralazine, has had it before Says BP runs ok at home, is high during HD because that stresses him out.  Feels they may d/c him today  Inpatient Medications    Scheduled Meds: . amLODipine  10 mg Oral Daily  . aspirin EC  81 mg Oral Daily  . atorvastatin  40 mg Oral Daily  . carvedilol  25 mg Oral BID WC  . Chlorhexidine Gluconate Cloth  6 each Topical Q0600  . darbepoetin (ARANESP) injection - DIALYSIS  100 mcg Intravenous Q Mon-HD  . doxercalciferol  6 mcg Intravenous Q M,W,F-HD  . famotidine  10 mg Oral Q M,W,F-HD  . ferric citrate  630 mg Oral TID WC  . heparin  5,000 Units Subcutaneous Q8H  . insulin aspart  0-5 Units Subcutaneous QHS  . insulin aspart  0-6 Units Subcutaneous TID WC  . ramelteon  8 mg Oral QHS  . sevelamer carbonate  1,600 mg Oral TID WC  . sodium chloride flush  3 mL Intravenous Q12H   Continuous Infusions: . sodium chloride     PRN Meds: sodium chloride, aspirin-acetaminophen-caffeine, diphenhydrAMINE, heparin, melatonin, nitroGLYCERIN, ondansetron (ZOFRAN) IV, sodium chloride flush   Vital Signs    Vitals:   08/08/20 2150 08/08/20 2322 08/09/20 0025 08/09/20 0440  BP: (!) 159/90  (!) 154/84 (!) 153/89  Pulse: 76  70 69  Resp: 18  16 16   Temp: 98.4 F (36.9 C)  98.2 F (36.8 C) 97.9 F (36.6 C)  TempSrc: Oral  Oral Oral  SpO2: 98% 97% 97% 98%  Weight:    90.6 kg  Height:        Intake/Output Summary (Last 24 hours) at 08/09/2020 0745 Last data filed at 08/08/2020 1211 Gross per 24 hour  Intake --  Output 1767 ml  Net -1767 ml   Last 3 Weights 08/09/2020 08/08/2020 08/08/2020  Weight (lbs) 199 lb 11.8 oz 202 lb 6.1 oz 205 lb 11 oz  Weight (kg) 90.6 kg 91.8 kg 93.3 kg     Telemetry    Atrial fib, rate controlled- Personally Reviewed  Physical Exam   GEN: No acute  distress.   Neck: mild JVD Cardiac: irreg R&R, 2/6 murmur, no rubs, or gallops.  Respiratory: diminished to auscultation bilaterally  GI: Soft, nontender, non-distended  MS: trace edema; No deformity. Neuro:  Nonfocal  Psych: Normal affect   Labs    High Sensitivity Troponin:   Recent Labs  Lab 08/05/20 0547 08/05/20 0855 08/05/20 1437 08/05/20 1610  TROPONINIHS 31* 31* 27* 27*     Chemistry Recent Labs  Lab 08/05/20 1437 08/06/20 0320 08/07/20 0555 08/07/20 0942 08/07/20 0943 08/08/20 0256 08/09/20 0550  NA 138   < > 138   < > 137 139 138  K 5.9*   < > 4.2   < > 4.4 5.0 4.7  CL 99   < > 97*  --   --  99 99  CO2 22   < > 27  --   --  26 26  GLUCOSE 127*   < > 95  --   --  96 93  BUN 73*   < > 47*  --   --  61* 36*  CREATININE 10.58*   < > 7.90*  --   --  9.46* 7.56*  CALCIUM 9.1   < >  9.2  --   --  9.2 9.4  PROT 7.1  --  6.8  --   --   --   --   ALBUMIN 3.5   < > 3.3*  --   --  3.3* 3.1*  AST 26  --  25  --   --   --   --   ALT 28  --  31  --   --   --   --   ALKPHOS 138*  --  150*  --   --   --   --   BILITOT 1.4*  --  1.1  --   --   --   --   GFRNONAA 5*   < > 7*  --   --  6* 7*  ANIONGAP 17*   < > 14  --   --  14 13   < > = values in this interval not displayed.     Hematology Recent Labs  Lab 08/07/20 0555 08/07/20 0942 08/07/20 0943 08/08/20 0700 08/09/20 0550  WBC 2.8*  --   --  3.3* 2.4*  RBC 3.66*  --   --  3.61* 3.59*  HGB 10.2*   < > 10.2* 10.0* 9.9*  HCT 31.5*   < > 30.0* 31.3* 31.6*  MCV 86.1  --   --  86.7 88.0  MCH 27.9  --   --  27.7 27.6  MCHC 32.4  --   --  31.9 31.3  RDW 20.9*  --   --  21.1* 20.3*  PLT 98*  --   --  108* 113*   < > = values in this interval not displayed.    BNPNo results for input(s): BNP, PROBNP in the last 168 hours.   DDimer  Recent Labs  Lab 08/05/20 1437  DDIMER 0.61*    Lab Results  Component Value Date   CHOL 167 08/07/2020   HDL 91 08/07/2020   LDLCALC 61 08/07/2020   LDLDIRECT 166.1  11/13/2008   TRIG 73 08/07/2020   CHOLHDL 1.8 08/07/2020     Radiology    CARDIAC CATHETERIZATION  Result Date: 08/07/2020  Mid LAD lesion is 50% stenosed.  Dist LAD lesion is 80% stenosed.  2nd Diag lesion is 95% stenosed.  Lat 2nd Diag lesion is 100% stenosed.  1st Diag lesion is 90% stenosed.  Ramus lesion is 90% stenosed.  1st Mrg lesion is 95% stenosed.  2nd Mrg lesion is 90% stenosed.  RPAV lesion is 80% stenosed.  Hemodynamic findings consistent with severe pulmonary hypertension.  LV end diastolic pressure is severely elevated.  1. Severe 3 vessel obstructive disease involving multiple branch vessels. 2. Severely elevated LV filling pressures. 30-33 mmHg 3. Severe pulmonary HTN- mean PAP 54 mm Hg 4. Good cardiac output. Index 5.33. Plan: need to optimize medical therapy and volume status.  His anatomy is poorly suited for revascularization due to small branch vessel disease. Only the second OM looks suitable for PCI and this alone would not significantly improve his outcome.    Cardiac Studies   CARDIAC CATH: 08/07/2020  Mid LAD lesion is 50% stenosed.  Dist LAD lesion is 80% stenosed.  2nd Diag lesion is 95% stenosed.  Lat 2nd Diag lesion is 100% stenosed.  1st Diag lesion is 90% stenosed.  Ramus lesion is 90% stenosed.  1st Mrg lesion is 95% stenosed.  2nd Mrg lesion is 90% stenosed.  RPAV lesion is 80% stenosed.  Hemodynamic findings consistent with  severe pulmonary hypertension.  LV end diastolic pressure is severely elevated.   1. Severe 3 vessel obstructive disease involving multiple branch vessels.  2. Severely elevated LV filling pressures. 30-33 mmHg 3. Severe pulmonary HTN- mean PAP 54 mm Hg 4. Good cardiac output. Index 5.33.   RA 19, RV 77/16   PA 76/39  PCWP 29   LVEDP 28     Plan: need to optimize medical therapy and volume status.  His anatomy is poorly suited for revascularization due to small branch vessel disease. Only the second  OM looks suitable for PCI and this alone would not significantly improve his outcome.    2D Echo at [facility] 08/02/20 ECHOCARDIOGRAPHIC MEASUREMENTS -----------------------------------------------  2D DIMENSIONS  AORTA     Values   Normal RangeMAIN PA   Values   Normal Range    Annulus: nm* cm  [2 - 3.2]   PA Main:  2.1 cm  [1.5 - 2.1]   Aorta Sin:  2.7 cm  [2.8 - 4]  RIGHT VENTRICLE  ST Junction: nm* cm  [2.3 - 3.5]  RV Base:  4.8 cm  [2.5 - 4.1]   Asc.Aorta:  2.9 cm  [2.2 - 3.8]   RV Mid:  3.5 cm  [1.9 - 3.5]  LEFT VENTRICLE             RV Length: nm* cm  [ ]      LVIDd:  4.9 cm  [4.2 - 5.8]RIGHT ATRIUM     LVIDs:  3.4 cm  [2.4 - 4]   RA Area: 17  cm2  [ <= 20]    LVEDVi: 81.0 ml/m2 [34 - 74]     RAVi: 22  ml/m2 [11 - 39]    LVESVi: 30.0 ml/m2 [11 - 31]  INFERIOR VENA CAVA      FS: 31  %   [ >= 25]   Max.IVC:  1.8 cm  [ <= 2.1]      SWT:  1.2 cm  [0.6 - 1]   Min.IVC:  1.7 cm  [ <= 1.7]      PWT:  1.2 cm  [0.6 - 1]  __________________  LEFT ATRIUM              nm* - not measured    LA Diam:  4.7 cm  [3- 4]    LA Area: 20  cm2  [ <= 20]   LA Volume: 80  mL  [18 - 58]     LAVi: 38  ml/m2 [16 - 34]   ECHOCARDIOGRAPHIC DESCRIPTIONS -----------------------------------------------  AORTIC ROOT     Size: Normal  Dissection: INDETERM FOR DISSECTION   AORTIC VALVE   Leaflets: Tricuspid       Morphology: MILDLY THICKENED   Mobility: PARTIALLY MOBILE   LEFT VENTRICLE                   Anterior: Normal     Size: Normal                 Lateral: Normal  Contraction: Normal                 Septal: Normal  Closest EF: >55%(Estimated) Calc.EF: 63% (3D)   Apical: Normal   LV masses: No Masses               Inferior:  Normal      LVH: MILD LVH CONCENTRIC         Posterior: Normal  LV  GLS(LOL): -12.1%  Dias.FxClass: RESTRICTIVE FILLING PATTERN (GRADE 3) CORRESPONDS TO REVERSIBLE        RESTRICTIVE PATTERN   MITRAL VALVE   Leaflets: Normal         Mobility: Fully mobile  Morphology: Normal   LEFT ATRIUM     Size: MILDLY ENLARGED   LA masses: No masses        Normal IAS   MAIN PA     Size: Normal    PA Note: PA acceleration time = 53msec   PULMONIC VALVE  Morphology: Normal   Mobility: Fully Mobile   RIGHT VENTRICLE     Size: MILDLY ENLARGED      Free wall: Normal  Contraction: Normal          RV masses: No Masses     TAPSE:  1.4 cm, Normal Range [>= 1.6 cm]   TRICUSPID VALVE   Leaflets: Normal        Mobility: Fully mobile  Morphology: Normal   RIGHT ATRIUM     Size: Normal           RA Other: None   RA masses: No masses   PERICARDIUM     Fluid: No effusion   INFERIOR VENACAVA     Size: Normal   ABNORMAL RESPIRATORY COLLAPSE   DOPPLER ECHO and OTHER SPECIAL PROCEDURES ------------------------------------   Aortic: No AR         No AS    Mitral: TRIVIAL MR       No MS   MV Inflow E Vel.= 125.0 cm/s MV Annulus E'Vel.= 9.0 cm/s E/E'Ratio= 14   Tricuspid: MODERATE TR      No TS       3.6 m/s peak TR vel  59 mmHg peak RV pressure   Pulmonary: TRIVIAL PR       No PS    Other:   INTERPRETATION ---------------------------------------------------------------  NORMAL LEFT VENTRICULAR SYSTOLIC FUNCTION WITH MILD LVH  ELEVATED LA PRESSURES WITH DIASTOLIC DYSFUNCTION  NORMAL RIGHT VENTRICULAR SYSTOLIC FUNCTION  VALVULAR REGURGITATION: TRIVIAL MR, TRIVIAL PR, MODERATE TR  NO VALVULAR STENOSIS  Aortic valve sclerosis present  NO PRIOR STUDY FOR COMPARISON    Patient Profile     54 y.o. male  with history of ESRD on HD, DM with renal manifestations, HTN, HLD, PAD (previous toe amputations, recently being evaluated for L leg percutaneous revascularization). He was previously evaluated in 06/2019 for chest pain and stress test recommended but he did not have this done. He was recently seen by vascular at [facility] pending evaluation for left leg percutaneous revascularization. 2D echo showed normal LVEF, grade 3 DD, moderate TR. He [facility] to [facility] with chest pressure and progressive DOE, concerning for possible angina.  Assessment & Plan    1. Cardiac - Admitted w/ Chest pain/pressure    R/L cath 10/19, results above - severe 3 v dz w/ severe branch vessel dz  Not amenable to intervention  Plan for medical therapy - R heart cath with severely elevated filling pressures, pulmonary HTN REcomm more volume removal at dialysis and aggressive Rx to improve BP control    -   2. ESRD on HD MWF - 2 L off yesterday - per IM/Nephrology  3. PAD - per MDs at [facility] cath planned 10/15 - high risk if surgery needed   4. HTN - SBP 150s after HD - on max dose Coreg and amlodipine -  add hydralazine 25 mg bid, do not take am of  HD    6. HLD - at goal, continue statin  7. Migraines - he is getting more of them - daughter wonders if propranolol would be better BB than Coreg, since it is used for migraines  For questions or updates, please contact [place] Please consult [email or web address] for contact info under Cardiology/STEMI.  Signed, Rosaria Ferries, PA-C 08/09/2020, 7:45 AM     Patient seen and examined  I agree with findings as noted above by R Barrett HA is improved     Lungs are CTA Cardiac  Irreg irreg  II/VI systolic murmur Ext with triv edema  Severe CAD with severely increased filling pressures Pt needs close outpt f/u of BP and strict volume management by HD   Hydralazine has been added for better BP control I have reviewed wht pharmacy   Propranolol may help wit  hmigraines    WOuld recomm switch to propranolol 80 bid from coreg  Watch sodium  WIl make sure pt has f/u appt in cardiology clinic with [facility]    [name] Carnes MD

## 2020-08-09 NOTE — Progress Notes (Addendum)
Subjective: Patient reports feeling well this AM. However, he reports that he is upset with how his migraines was treated yesterday in HD. Patient reports that he did not receive the medication that he had been requesting and was ordered until he started vomiting. He is very hurt and feels like no one in HD believed him when he reported his migraine. Patient communicates that he has a history of mistrust toward the healthcare system, but he continmues to attempt to make adjustments to optimize his possible.  Objective:  Vital signs in last 24 hours: Vitals:   08/09/20 0025 08/09/20 0440 08/09/20 0600 08/09/20 0846  BP: (!) 154/84 (!) 153/89  (!) 161/87  Pulse: 70 69    Resp: 16 16 16    Temp: 98.2 F (36.8 C) 97.9 F (36.6 C)    TempSrc: Oral Oral    SpO2: 97% 98%    Weight:  90.6 kg    Height:       Physical Exam Constitutional:      Appearance: He is well-developed. He is not ill-appearing.     Comments: Very verbose about his treatment in the healthcare system. Patient also talks about trauma of 8 out 16 sibling all dying from similar if not the same illnesses. Patient also talks about traumas from watching other non related individuals die with the same illnesses.   Patient talks about overall optimism and is determined to live a healthy lifestyle to help slow progression of CAD. "Nyoka Cowden is clean" is his motto for his self-imposed new diet.  HENT:     Head: Normocephalic and atraumatic.  Cardiovascular:     Rate and Rhythm: Normal rate and regular rhythm.  Pulmonary:     Effort: Pulmonary effort is normal.     Breath sounds: Normal breath sounds.  Abdominal:     General: Bowel sounds are normal.     Palpations: Abdomen is soft.     Tenderness: There is no abdominal tenderness.  Neurological:     Mental Status: He is alert and oriented to person, place, and time.     Assessment/Plan:  Principal Problem:   Chest pain Active Problems:   ESRD on dialysis ([place])   Type 2  diabetes mellitus ([place])   Pancytopenia ([place])  CAD Patient continues to be hypertensive. Patient has not required pain medication for his chest pain over the past 24h.  At this time Card recommends a lower dry weight and tighter control of BP with addition of Coreg. Unclear if patient is candidate for CABG at this time. Cards increased patient from 12.5mg  BID to 25mg  BID. Cardiology switched patient from Coreg to Propanolol at discharge to help assist with migraine prophylaxis and malignant hypertension. - Cardiology consulted, appreciate recc's - Recommend patient f/u with his Cardiologist outpatient  - Continue Atorvastatin 40mg  -Nitroglycerin 0.4 PRN x3 is limit - discontinue Coreg 25 BID - Start propanolol 80mg  BID   Migraine Patient reports a history of similar headaches. He reports successful abortive therapy with Excedrin and Tylenol. Nephrology is okay with patient receiving either of these medications while in HD. -Tylenol -Excedrin 2 tablets q6h PRN   HTN Patient continues to be hypertensive with sys 152-217. Patient will require tighter control of BP. Cardiology also recommends hydralazine. - Continue Amlodipine 10mg   - Start Propanolol - Cardiology recc's appreciated - Hydralazine 25mg  BID   PAD Patient has a history of PAD. He is scheduled to have a stent placed in his L leg within the next 2  weeks. On exam today both lower extremities were well perfused. Patient likely is suffering from a ischemia and neuropathy causing his lower leg numbness and pain. This may also be causing patient to have Left side arm pain as well.  - Patient should follow up with his vascular surgery physicians outpatient, scheduled for L leg angiogram OP - Continue ASA   T2DM CBG this AM 96. - SAI ac& qhs   Nausea Patient reports nausea with no emesis. Concern that patient may be developing gastroparesis vs GERD. Na and K were WNL.  Will continue to assess. Patient is reporting some reflux. -  Zofran 4mg  - Heart Healthy diet - GI cocktail - Pepcid 10mg  MWF after HD    ESRD MWF Patient underwent HD yesterday. Patient is scheduled for HD this AM. Patient was only able to off load 1.7 L due to migraine with associated emesis.  - Nephro will continue to slowly lower dry weight gradually   Pancytopenia Patient noted to have low WBC's, Hgb, and Plt on repeat CBC. Baseline WBC appears to be around 6 and Plts tend to be >300. Patient has ESRD likely causing anemia of chronic disease. Concern for Aplastic anemia has increased Retic count is suggests hypoproliferation. Blood smear shows mild thrombocytopnia with normocytic anemia with anisocytosis. At this time will recommend referral to Hematology OP.  -F/u haptoglobin   Prior to Admission Living Arrangement: Home Anticipated Discharge Location: Home Barriers to Discharge: Treatment Dispo: Anticipated discharge in approximately 1-2 day(s).   Freida Busman, MD 08/09/2020, 9:38 AM Pager: [phone] After 5pm on weekdays and 1pm on weekends: On Call pager [phone]

## 2020-08-10 DIAGNOSIS — D631 Anemia in chronic kidney disease: Secondary | ICD-10-CM | POA: Diagnosis not present

## 2020-08-10 DIAGNOSIS — R079 Chest pain, unspecified: Secondary | ICD-10-CM | POA: Diagnosis not present

## 2020-08-10 DIAGNOSIS — I2510 Atherosclerotic heart disease of native coronary artery without angina pectoris: Secondary | ICD-10-CM | POA: Diagnosis not present

## 2020-08-10 DIAGNOSIS — Z992 Dependence on renal dialysis: Secondary | ICD-10-CM | POA: Diagnosis not present

## 2020-08-10 DIAGNOSIS — N2581 Secondary hyperparathyroidism of renal origin: Secondary | ICD-10-CM | POA: Diagnosis not present

## 2020-08-10 DIAGNOSIS — N186 End stage renal disease: Secondary | ICD-10-CM | POA: Diagnosis not present

## 2020-08-10 DIAGNOSIS — I10 Essential (primary) hypertension: Secondary | ICD-10-CM | POA: Diagnosis not present

## 2020-08-10 DIAGNOSIS — E8779 Other fluid overload: Secondary | ICD-10-CM | POA: Diagnosis not present

## 2020-08-11 DIAGNOSIS — N186 End stage renal disease: Secondary | ICD-10-CM | POA: Diagnosis not present

## 2020-08-11 DIAGNOSIS — Z992 Dependence on renal dialysis: Secondary | ICD-10-CM | POA: Diagnosis not present

## 2020-08-11 DIAGNOSIS — D631 Anemia in chronic kidney disease: Secondary | ICD-10-CM | POA: Diagnosis not present

## 2020-08-11 DIAGNOSIS — E8779 Other fluid overload: Secondary | ICD-10-CM | POA: Diagnosis not present

## 2020-08-11 DIAGNOSIS — N2581 Secondary hyperparathyroidism of renal origin: Secondary | ICD-10-CM | POA: Diagnosis not present

## 2020-08-13 ENCOUNTER — Other Ambulatory Visit: Payer: Self-pay | Admitting: *Deleted

## 2020-08-13 DIAGNOSIS — D631 Anemia in chronic kidney disease: Secondary | ICD-10-CM | POA: Diagnosis not present

## 2020-08-13 DIAGNOSIS — N2581 Secondary hyperparathyroidism of renal origin: Secondary | ICD-10-CM | POA: Diagnosis not present

## 2020-08-13 DIAGNOSIS — Z992 Dependence on renal dialysis: Secondary | ICD-10-CM | POA: Diagnosis not present

## 2020-08-13 DIAGNOSIS — E8779 Other fluid overload: Secondary | ICD-10-CM | POA: Diagnosis not present

## 2020-08-13 DIAGNOSIS — N186 End stage renal disease: Secondary | ICD-10-CM | POA: Diagnosis not present

## 2020-08-13 NOTE — Patient Outreach (Signed)
[facility]) Care Management  08/13/2020  [name] [birth year] [id number]   EMMI-GENERAL DISCHARGE-RESOLVED RED ON EMMI ALERT Day #1 Date: 08/11/2020 Red Alert Reason: NO FOLLOW UP APPOINTMENT  OUTREACH #1 RN spoke with pt concerning the above EMMI. Pt did not have a follow up appointment based up the procedure that was done however RN offered contact number to both his primary provider Dr. Cathlean Cower and his CAD provider Dr. Marlou Porch. Pt has both contacts with no additional needs.   Case will be closed with no needs and the above emmi as resolved.  Raina Mina, RN Care Management Coordinator [name] Office [phone]

## 2020-08-15 DIAGNOSIS — N186 End stage renal disease: Secondary | ICD-10-CM | POA: Diagnosis not present

## 2020-08-15 DIAGNOSIS — N2581 Secondary hyperparathyroidism of renal origin: Secondary | ICD-10-CM | POA: Diagnosis not present

## 2020-08-15 DIAGNOSIS — Z992 Dependence on renal dialysis: Secondary | ICD-10-CM | POA: Diagnosis not present

## 2020-08-15 DIAGNOSIS — D631 Anemia in chronic kidney disease: Secondary | ICD-10-CM | POA: Diagnosis not present

## 2020-08-15 DIAGNOSIS — E8779 Other fluid overload: Secondary | ICD-10-CM | POA: Diagnosis not present

## 2020-08-17 DIAGNOSIS — Z992 Dependence on renal dialysis: Secondary | ICD-10-CM | POA: Diagnosis not present

## 2020-08-17 DIAGNOSIS — D631 Anemia in chronic kidney disease: Secondary | ICD-10-CM | POA: Diagnosis not present

## 2020-08-17 DIAGNOSIS — N2581 Secondary hyperparathyroidism of renal origin: Secondary | ICD-10-CM | POA: Diagnosis not present

## 2020-08-17 DIAGNOSIS — N186 End stage renal disease: Secondary | ICD-10-CM | POA: Diagnosis not present

## 2020-08-17 DIAGNOSIS — E8779 Other fluid overload: Secondary | ICD-10-CM | POA: Diagnosis not present

## 2020-08-20 DIAGNOSIS — N186 End stage renal disease: Secondary | ICD-10-CM | POA: Diagnosis not present

## 2020-08-20 DIAGNOSIS — D631 Anemia in chronic kidney disease: Secondary | ICD-10-CM | POA: Diagnosis not present

## 2020-08-20 DIAGNOSIS — D509 Iron deficiency anemia, unspecified: Secondary | ICD-10-CM | POA: Diagnosis not present

## 2020-08-20 DIAGNOSIS — E1129 Type 2 diabetes mellitus with other diabetic kidney complication: Secondary | ICD-10-CM | POA: Diagnosis not present

## 2020-08-20 DIAGNOSIS — Z992 Dependence on renal dialysis: Secondary | ICD-10-CM | POA: Diagnosis not present

## 2020-08-20 DIAGNOSIS — N2581 Secondary hyperparathyroidism of renal origin: Secondary | ICD-10-CM | POA: Diagnosis not present

## 2020-08-22 DIAGNOSIS — D509 Iron deficiency anemia, unspecified: Secondary | ICD-10-CM | POA: Diagnosis not present

## 2020-08-22 DIAGNOSIS — N186 End stage renal disease: Secondary | ICD-10-CM | POA: Diagnosis not present

## 2020-08-22 DIAGNOSIS — N2581 Secondary hyperparathyroidism of renal origin: Secondary | ICD-10-CM | POA: Diagnosis not present

## 2020-08-22 DIAGNOSIS — Z992 Dependence on renal dialysis: Secondary | ICD-10-CM | POA: Diagnosis not present

## 2020-08-22 DIAGNOSIS — D631 Anemia in chronic kidney disease: Secondary | ICD-10-CM | POA: Diagnosis not present

## 2020-08-23 DIAGNOSIS — E782 Mixed hyperlipidemia: Secondary | ICD-10-CM | POA: Diagnosis not present

## 2020-08-23 DIAGNOSIS — I739 Peripheral vascular disease, unspecified: Secondary | ICD-10-CM | POA: Diagnosis not present

## 2020-08-23 DIAGNOSIS — L97525 Non-pressure chronic ulcer of other part of left foot with muscle involvement without evidence of necrosis: Secondary | ICD-10-CM | POA: Diagnosis not present

## 2020-08-23 DIAGNOSIS — I2510 Atherosclerotic heart disease of native coronary artery without angina pectoris: Secondary | ICD-10-CM | POA: Diagnosis not present

## 2020-08-23 DIAGNOSIS — I10 Essential (primary) hypertension: Secondary | ICD-10-CM | POA: Diagnosis not present

## 2020-08-24 DIAGNOSIS — D509 Iron deficiency anemia, unspecified: Secondary | ICD-10-CM | POA: Diagnosis not present

## 2020-08-24 DIAGNOSIS — D631 Anemia in chronic kidney disease: Secondary | ICD-10-CM | POA: Diagnosis not present

## 2020-08-24 DIAGNOSIS — N2581 Secondary hyperparathyroidism of renal origin: Secondary | ICD-10-CM | POA: Diagnosis not present

## 2020-08-24 DIAGNOSIS — N186 End stage renal disease: Secondary | ICD-10-CM | POA: Diagnosis not present

## 2020-08-24 DIAGNOSIS — Z992 Dependence on renal dialysis: Secondary | ICD-10-CM | POA: Diagnosis not present

## 2020-08-27 DIAGNOSIS — Z992 Dependence on renal dialysis: Secondary | ICD-10-CM | POA: Diagnosis not present

## 2020-08-27 DIAGNOSIS — N186 End stage renal disease: Secondary | ICD-10-CM | POA: Diagnosis not present

## 2020-08-27 DIAGNOSIS — N2581 Secondary hyperparathyroidism of renal origin: Secondary | ICD-10-CM | POA: Diagnosis not present

## 2020-08-27 DIAGNOSIS — D509 Iron deficiency anemia, unspecified: Secondary | ICD-10-CM | POA: Diagnosis not present

## 2020-08-27 DIAGNOSIS — D631 Anemia in chronic kidney disease: Secondary | ICD-10-CM | POA: Diagnosis not present

## 2020-08-29 DIAGNOSIS — D509 Iron deficiency anemia, unspecified: Secondary | ICD-10-CM | POA: Diagnosis not present

## 2020-08-29 DIAGNOSIS — N186 End stage renal disease: Secondary | ICD-10-CM | POA: Diagnosis not present

## 2020-08-29 DIAGNOSIS — D631 Anemia in chronic kidney disease: Secondary | ICD-10-CM | POA: Diagnosis not present

## 2020-08-29 DIAGNOSIS — Z992 Dependence on renal dialysis: Secondary | ICD-10-CM | POA: Diagnosis not present

## 2020-08-29 DIAGNOSIS — N2581 Secondary hyperparathyroidism of renal origin: Secondary | ICD-10-CM | POA: Diagnosis not present

## 2020-08-31 DIAGNOSIS — D509 Iron deficiency anemia, unspecified: Secondary | ICD-10-CM | POA: Diagnosis not present

## 2020-08-31 DIAGNOSIS — N186 End stage renal disease: Secondary | ICD-10-CM | POA: Diagnosis not present

## 2020-08-31 DIAGNOSIS — D631 Anemia in chronic kidney disease: Secondary | ICD-10-CM | POA: Diagnosis not present

## 2020-08-31 DIAGNOSIS — Z992 Dependence on renal dialysis: Secondary | ICD-10-CM | POA: Diagnosis not present

## 2020-08-31 DIAGNOSIS — N2581 Secondary hyperparathyroidism of renal origin: Secondary | ICD-10-CM | POA: Diagnosis not present

## 2020-09-03 DIAGNOSIS — Z992 Dependence on renal dialysis: Secondary | ICD-10-CM | POA: Diagnosis not present

## 2020-09-03 DIAGNOSIS — N2581 Secondary hyperparathyroidism of renal origin: Secondary | ICD-10-CM | POA: Diagnosis not present

## 2020-09-03 DIAGNOSIS — N186 End stage renal disease: Secondary | ICD-10-CM | POA: Diagnosis not present

## 2020-09-03 DIAGNOSIS — D509 Iron deficiency anemia, unspecified: Secondary | ICD-10-CM | POA: Diagnosis not present

## 2020-09-03 DIAGNOSIS — D631 Anemia in chronic kidney disease: Secondary | ICD-10-CM | POA: Diagnosis not present

## 2020-09-04 DIAGNOSIS — I2510 Atherosclerotic heart disease of native coronary artery without angina pectoris: Secondary | ICD-10-CM | POA: Diagnosis not present

## 2020-09-04 DIAGNOSIS — I739 Peripheral vascular disease, unspecified: Secondary | ICD-10-CM | POA: Diagnosis not present

## 2020-09-04 DIAGNOSIS — H6523 Chronic serous otitis media, bilateral: Secondary | ICD-10-CM | POA: Diagnosis not present

## 2020-09-04 DIAGNOSIS — Z125 Encounter for screening for malignant neoplasm of prostate: Secondary | ICD-10-CM | POA: Diagnosis not present

## 2020-09-04 DIAGNOSIS — I10 Essential (primary) hypertension: Secondary | ICD-10-CM | POA: Diagnosis not present

## 2020-09-04 DIAGNOSIS — E1122 Type 2 diabetes mellitus with diabetic chronic kidney disease: Secondary | ICD-10-CM | POA: Diagnosis not present

## 2020-09-04 DIAGNOSIS — Z0000 Encounter for general adult medical examination without abnormal findings: Secondary | ICD-10-CM | POA: Diagnosis not present

## 2020-09-05 DIAGNOSIS — Z992 Dependence on renal dialysis: Secondary | ICD-10-CM | POA: Diagnosis not present

## 2020-09-05 DIAGNOSIS — N186 End stage renal disease: Secondary | ICD-10-CM | POA: Diagnosis not present

## 2020-09-05 DIAGNOSIS — N2581 Secondary hyperparathyroidism of renal origin: Secondary | ICD-10-CM | POA: Diagnosis not present

## 2020-09-05 DIAGNOSIS — D509 Iron deficiency anemia, unspecified: Secondary | ICD-10-CM | POA: Diagnosis not present

## 2020-09-05 DIAGNOSIS — D631 Anemia in chronic kidney disease: Secondary | ICD-10-CM | POA: Diagnosis not present

## 2020-09-06 DIAGNOSIS — L97525 Non-pressure chronic ulcer of other part of left foot with muscle involvement without evidence of necrosis: Secondary | ICD-10-CM | POA: Diagnosis not present

## 2020-09-07 DIAGNOSIS — D509 Iron deficiency anemia, unspecified: Secondary | ICD-10-CM | POA: Diagnosis not present

## 2020-09-07 DIAGNOSIS — N2581 Secondary hyperparathyroidism of renal origin: Secondary | ICD-10-CM | POA: Diagnosis not present

## 2020-09-07 DIAGNOSIS — Z992 Dependence on renal dialysis: Secondary | ICD-10-CM | POA: Diagnosis not present

## 2020-09-07 DIAGNOSIS — D631 Anemia in chronic kidney disease: Secondary | ICD-10-CM | POA: Diagnosis not present

## 2020-09-07 DIAGNOSIS — N186 End stage renal disease: Secondary | ICD-10-CM | POA: Diagnosis not present

## 2020-09-09 DIAGNOSIS — D631 Anemia in chronic kidney disease: Secondary | ICD-10-CM | POA: Diagnosis not present

## 2020-09-09 DIAGNOSIS — N2581 Secondary hyperparathyroidism of renal origin: Secondary | ICD-10-CM | POA: Diagnosis not present

## 2020-09-09 DIAGNOSIS — Z992 Dependence on renal dialysis: Secondary | ICD-10-CM | POA: Diagnosis not present

## 2020-09-09 DIAGNOSIS — D509 Iron deficiency anemia, unspecified: Secondary | ICD-10-CM | POA: Diagnosis not present

## 2020-09-09 DIAGNOSIS — N186 End stage renal disease: Secondary | ICD-10-CM | POA: Diagnosis not present

## 2020-09-10 ENCOUNTER — Telehealth: Payer: Self-pay

## 2020-09-10 NOTE — Telephone Encounter (Signed)
NOTES [place] EXT 226, SENT REFERRAL TO SCHEDULING

## 2020-09-11 DIAGNOSIS — N2581 Secondary hyperparathyroidism of renal origin: Secondary | ICD-10-CM | POA: Diagnosis not present

## 2020-09-11 DIAGNOSIS — D509 Iron deficiency anemia, unspecified: Secondary | ICD-10-CM | POA: Diagnosis not present

## 2020-09-11 DIAGNOSIS — D631 Anemia in chronic kidney disease: Secondary | ICD-10-CM | POA: Diagnosis not present

## 2020-09-11 DIAGNOSIS — N186 End stage renal disease: Secondary | ICD-10-CM | POA: Diagnosis not present

## 2020-09-11 DIAGNOSIS — Z992 Dependence on renal dialysis: Secondary | ICD-10-CM | POA: Diagnosis not present

## 2020-09-17 DIAGNOSIS — Z992 Dependence on renal dialysis: Secondary | ICD-10-CM | POA: Diagnosis not present

## 2020-09-17 DIAGNOSIS — D509 Iron deficiency anemia, unspecified: Secondary | ICD-10-CM | POA: Diagnosis not present

## 2020-09-17 DIAGNOSIS — N186 End stage renal disease: Secondary | ICD-10-CM | POA: Diagnosis not present

## 2020-09-17 DIAGNOSIS — D631 Anemia in chronic kidney disease: Secondary | ICD-10-CM | POA: Diagnosis not present

## 2020-09-17 DIAGNOSIS — N2581 Secondary hyperparathyroidism of renal origin: Secondary | ICD-10-CM | POA: Diagnosis not present

## 2020-09-19 DIAGNOSIS — E8779 Other fluid overload: Secondary | ICD-10-CM | POA: Diagnosis not present

## 2020-09-19 DIAGNOSIS — D631 Anemia in chronic kidney disease: Secondary | ICD-10-CM | POA: Diagnosis not present

## 2020-09-19 DIAGNOSIS — N2581 Secondary hyperparathyroidism of renal origin: Secondary | ICD-10-CM | POA: Diagnosis not present

## 2020-09-19 DIAGNOSIS — D509 Iron deficiency anemia, unspecified: Secondary | ICD-10-CM | POA: Diagnosis not present

## 2020-09-19 DIAGNOSIS — N186 End stage renal disease: Secondary | ICD-10-CM | POA: Diagnosis not present

## 2020-09-19 DIAGNOSIS — Z992 Dependence on renal dialysis: Secondary | ICD-10-CM | POA: Diagnosis not present

## 2020-09-19 DIAGNOSIS — E1129 Type 2 diabetes mellitus with other diabetic kidney complication: Secondary | ICD-10-CM | POA: Diagnosis not present

## 2020-09-20 DIAGNOSIS — E8779 Other fluid overload: Secondary | ICD-10-CM | POA: Diagnosis not present

## 2020-09-20 DIAGNOSIS — N2581 Secondary hyperparathyroidism of renal origin: Secondary | ICD-10-CM | POA: Diagnosis not present

## 2020-09-20 DIAGNOSIS — N186 End stage renal disease: Secondary | ICD-10-CM | POA: Diagnosis not present

## 2020-09-20 DIAGNOSIS — Z992 Dependence on renal dialysis: Secondary | ICD-10-CM | POA: Diagnosis not present

## 2020-09-21 ENCOUNTER — Emergency Department (HOSPITAL_COMMUNITY): Payer: Medicare Other

## 2020-09-21 ENCOUNTER — Other Ambulatory Visit: Payer: Self-pay

## 2020-09-21 ENCOUNTER — Inpatient Hospital Stay (HOSPITAL_COMMUNITY)
Admission: EM | Admit: 2020-09-21 | Discharge: 2020-09-26 | DRG: 246 | Disposition: A | Discharge status: Home / Self Care | Payer: Medicare Other | Attending: Internal Medicine | Admitting: Internal Medicine

## 2020-09-21 DIAGNOSIS — R079 Chest pain, unspecified: Secondary | ICD-10-CM

## 2020-09-21 DIAGNOSIS — Z89421 Acquired absence of other right toe(s): Secondary | ICD-10-CM | POA: Diagnosis not present

## 2020-09-21 DIAGNOSIS — Z833 Family history of diabetes mellitus: Secondary | ICD-10-CM

## 2020-09-21 DIAGNOSIS — I10 Essential (primary) hypertension: Secondary | ICD-10-CM | POA: Diagnosis not present

## 2020-09-21 DIAGNOSIS — H338 Other retinal detachments: Secondary | ICD-10-CM | POA: Diagnosis not present

## 2020-09-21 DIAGNOSIS — Z6827 Body mass index (BMI) 27.0-27.9, adult: Secondary | ICD-10-CM

## 2020-09-21 DIAGNOSIS — E1122 Type 2 diabetes mellitus with diabetic chronic kidney disease: Secondary | ICD-10-CM | POA: Diagnosis not present

## 2020-09-21 DIAGNOSIS — Z20822 Contact with and (suspected) exposure to covid-19: Secondary | ICD-10-CM | POA: Diagnosis present

## 2020-09-21 DIAGNOSIS — E11319 Type 2 diabetes mellitus with unspecified diabetic retinopathy without macular edema: Secondary | ICD-10-CM | POA: Diagnosis present

## 2020-09-21 DIAGNOSIS — E875 Hyperkalemia: Secondary | ICD-10-CM | POA: Diagnosis not present

## 2020-09-21 DIAGNOSIS — I25110 Atherosclerotic heart disease of native coronary artery with unstable angina pectoris: Secondary | ICD-10-CM | POA: Diagnosis present

## 2020-09-21 DIAGNOSIS — R0902 Hypoxemia: Secondary | ICD-10-CM | POA: Diagnosis not present

## 2020-09-21 DIAGNOSIS — Z992 Dependence on renal dialysis: Secondary | ICD-10-CM

## 2020-09-21 DIAGNOSIS — E113293 Type 2 diabetes mellitus with mild nonproliferative diabetic retinopathy without macular edema, bilateral: Secondary | ICD-10-CM

## 2020-09-21 DIAGNOSIS — Z7982 Long term (current) use of aspirin: Secondary | ICD-10-CM

## 2020-09-21 DIAGNOSIS — E1129 Type 2 diabetes mellitus with other diabetic kidney complication: Secondary | ICD-10-CM

## 2020-09-21 DIAGNOSIS — Z881 Allergy status to other antibiotic agents status: Secondary | ICD-10-CM

## 2020-09-21 DIAGNOSIS — N2581 Secondary hyperparathyroidism of renal origin: Secondary | ICD-10-CM | POA: Diagnosis present

## 2020-09-21 DIAGNOSIS — I214 Non-ST elevation (NSTEMI) myocardial infarction: Secondary | ICD-10-CM | POA: Diagnosis not present

## 2020-09-21 DIAGNOSIS — E11621 Type 2 diabetes mellitus with foot ulcer: Secondary | ICD-10-CM | POA: Diagnosis present

## 2020-09-21 DIAGNOSIS — I119 Hypertensive heart disease without heart failure: Secondary | ICD-10-CM | POA: Diagnosis not present

## 2020-09-21 DIAGNOSIS — I120 Hypertensive chronic kidney disease with stage 5 chronic kidney disease or end stage renal disease: Secondary | ICD-10-CM | POA: Diagnosis not present

## 2020-09-21 DIAGNOSIS — Z8614 Personal history of Methicillin resistant Staphylococcus aureus infection: Secondary | ICD-10-CM

## 2020-09-21 DIAGNOSIS — I358 Other nonrheumatic aortic valve disorders: Secondary | ICD-10-CM | POA: Diagnosis not present

## 2020-09-21 DIAGNOSIS — E1165 Type 2 diabetes mellitus with hyperglycemia: Secondary | ICD-10-CM | POA: Diagnosis present

## 2020-09-21 DIAGNOSIS — Z885 Allergy status to narcotic agent status: Secondary | ICD-10-CM

## 2020-09-21 DIAGNOSIS — R0789 Other chest pain: Secondary | ICD-10-CM | POA: Diagnosis not present

## 2020-09-21 DIAGNOSIS — L97519 Non-pressure chronic ulcer of other part of right foot with unspecified severity: Secondary | ICD-10-CM | POA: Diagnosis not present

## 2020-09-21 DIAGNOSIS — N186 End stage renal disease: Secondary | ICD-10-CM

## 2020-09-21 DIAGNOSIS — E8889 Other specified metabolic disorders: Secondary | ICD-10-CM | POA: Diagnosis present

## 2020-09-21 DIAGNOSIS — D631 Anemia in chronic kidney disease: Secondary | ICD-10-CM | POA: Diagnosis not present

## 2020-09-21 DIAGNOSIS — Z955 Presence of coronary angioplasty implant and graft: Secondary | ICD-10-CM

## 2020-09-21 DIAGNOSIS — I272 Pulmonary hypertension, unspecified: Secondary | ICD-10-CM | POA: Diagnosis not present

## 2020-09-21 DIAGNOSIS — E669 Obesity, unspecified: Secondary | ICD-10-CM | POA: Diagnosis present

## 2020-09-21 DIAGNOSIS — Z79899 Other long term (current) drug therapy: Secondary | ICD-10-CM

## 2020-09-21 DIAGNOSIS — J9811 Atelectasis: Secondary | ICD-10-CM | POA: Diagnosis not present

## 2020-09-21 DIAGNOSIS — Z888 Allergy status to other drugs, medicaments and biological substances status: Secondary | ICD-10-CM

## 2020-09-21 DIAGNOSIS — R0689 Other abnormalities of breathing: Secondary | ICD-10-CM | POA: Diagnosis not present

## 2020-09-21 DIAGNOSIS — I2510 Atherosclerotic heart disease of native coronary artery without angina pectoris: Secondary | ICD-10-CM | POA: Diagnosis not present

## 2020-09-21 DIAGNOSIS — N250 Renal osteodystrophy: Secondary | ICD-10-CM | POA: Diagnosis not present

## 2020-09-21 DIAGNOSIS — L97509 Non-pressure chronic ulcer of other part of unspecified foot with unspecified severity: Secondary | ICD-10-CM | POA: Diagnosis present

## 2020-09-21 DIAGNOSIS — D509 Iron deficiency anemia, unspecified: Secondary | ICD-10-CM | POA: Diagnosis not present

## 2020-09-21 DIAGNOSIS — D649 Anemia, unspecified: Secondary | ICD-10-CM | POA: Diagnosis not present

## 2020-09-21 DIAGNOSIS — E785 Hyperlipidemia, unspecified: Secondary | ICD-10-CM | POA: Diagnosis present

## 2020-09-21 LAB — CBC
HCT: 39.2 % (ref 39.0–52.0)
Hemoglobin: 12.4 g/dL — ABNORMAL LOW (ref 13.0–17.0)
MCH: 28.2 pg (ref 26.0–34.0)
MCHC: 31.6 g/dL (ref 30.0–36.0)
MCV: 89.3 fL (ref 80.0–100.0)
Platelets: 202 10*3/uL (ref 150–400)
RBC: 4.39 MIL/uL (ref 4.22–5.81)
RDW: 19.2 % — ABNORMAL HIGH (ref 11.5–15.5)
WBC: 5.8 10*3/uL (ref 4.0–10.5)
nRBC: 0 % (ref 0.0–0.2)

## 2020-09-21 LAB — BASIC METABOLIC PANEL
Anion gap: 17 — ABNORMAL HIGH (ref 5–15)
BUN: 19 mg/dL (ref 6–20)
CO2: 27 mmol/L (ref 22–32)
Calcium: 9.7 mg/dL (ref 8.9–10.3)
Chloride: 93 mmol/L — ABNORMAL LOW (ref 98–111)
Creatinine, Ser: 5.1 mg/dL — ABNORMAL HIGH (ref 0.61–1.24)
GFR, Estimated: 13 mL/min — ABNORMAL LOW (ref >60)
Glucose, Bld: 256 mg/dL — ABNORMAL HIGH (ref 70–99)
Potassium: 3.8 mmol/L (ref 3.5–5.1)
Sodium: 137 mmol/L (ref 135–145)

## 2020-09-21 LAB — LIPASE, BLOOD: Lipase: 23 U/L (ref 11–51)

## 2020-09-21 LAB — HEPATIC FUNCTION PANEL
ALT: 24 U/L (ref 0–44)
AST: 37 U/L (ref 15–41)
Albumin: 3.6 g/dL (ref 3.5–5.0)
Alkaline Phosphatase: 185 U/L — ABNORMAL HIGH (ref 38–126)
Bilirubin, Direct: 0.3 mg/dL — ABNORMAL HIGH (ref 0.0–0.2)
Indirect Bilirubin: 1.1 mg/dL — ABNORMAL HIGH (ref 0.3–0.9)
Total Bilirubin: 1.4 mg/dL — ABNORMAL HIGH (ref 0.3–1.2)
Total Protein: 7.7 g/dL (ref 6.5–8.1)

## 2020-09-21 LAB — RESP PANEL BY RT-PCR (FLU A&B, COVID) ARPGX2
Influenza A by PCR: NEGATIVE
Influenza B by PCR: NEGATIVE
SARS Coronavirus 2 by RT PCR: NEGATIVE

## 2020-09-21 LAB — TROPONIN I (HIGH SENSITIVITY)
Troponin I (High Sensitivity): 1701 ng/L (ref <18)
Troponin I (High Sensitivity): 5089 ng/L (ref <18)
Troponin I (High Sensitivity): 13653 ng/L (ref <18)
Troponin I (High Sensitivity): 15203 ng/L (ref <18)

## 2020-09-21 LAB — HEPARIN LEVEL (UNFRACTIONATED): Heparin Unfractionated: 0.14 IU/mL — ABNORMAL LOW (ref 0.30–0.70)

## 2020-09-21 LAB — GLUCOSE, CAPILLARY: Glucose-Capillary: 114 mg/dL — ABNORMAL HIGH (ref 70–99)

## 2020-09-21 MED ORDER — AMLODIPINE BESYLATE 10 MG PO TABS
10.0000 mg | ORAL_TABLET | Freq: Every day | ORAL | Status: DC
  Administered 2020-09-21 – 2020-09-25 (×5): 10 mg via ORAL
  Filled 2020-09-21 (×5): qty 1

## 2020-09-21 MED ORDER — NITROGLYCERIN IN D5W 200-5 MCG/ML-% IV SOLN
0.0000 ug/min | INTRAVENOUS | Status: DC
  Administered 2020-09-21: 5 ug/min via INTRAVENOUS
  Filled 2020-09-21: qty 250

## 2020-09-21 MED ORDER — HEPARIN (PORCINE) 25000 UT/250ML-% IV SOLN
1600.0000 [IU]/h | INTRAVENOUS | Status: DC
  Administered 2020-09-21: 1050 [IU]/h via INTRAVENOUS
  Administered 2020-09-22: 1250 [IU]/h via INTRAVENOUS
  Administered 2020-09-23 – 2020-09-24 (×3): 1600 [IU]/h via INTRAVENOUS
  Filled 2020-09-21 (×5): qty 250

## 2020-09-21 MED ORDER — DIPHENHYDRAMINE HCL 50 MG/ML IJ SOLN
12.5000 mg | Freq: Once | INTRAMUSCULAR | Status: AC
  Administered 2020-09-21: 12.5 mg via INTRAVENOUS
  Filled 2020-09-21: qty 1

## 2020-09-21 MED ORDER — ASPIRIN 81 MG PO CHEW
324.0000 mg | CHEWABLE_TABLET | Freq: Once | ORAL | Status: DC
  Filled 2020-09-21: qty 4

## 2020-09-21 MED ORDER — INSULIN ASPART 100 UNIT/ML ~~LOC~~ SOLN
0.0000 [IU] | Freq: Every day | SUBCUTANEOUS | Status: DC
  Administered 2020-09-23: 2 [IU] via SUBCUTANEOUS

## 2020-09-21 MED ORDER — SEVELAMER CARBONATE 800 MG PO TABS
800.0000 mg | ORAL_TABLET | Freq: Three times a day (TID) | ORAL | Status: DC
  Administered 2020-09-22 (×2): 800 mg via ORAL
  Filled 2020-09-21 (×4): qty 1

## 2020-09-21 MED ORDER — HYDRALAZINE HCL 25 MG PO TABS
25.0000 mg | ORAL_TABLET | Freq: Three times a day (TID) | ORAL | Status: DC
  Administered 2020-09-21 – 2020-09-26 (×11): 25 mg via ORAL
  Filled 2020-09-21 (×13): qty 1

## 2020-09-21 MED ORDER — ATORVASTATIN CALCIUM 40 MG PO TABS
40.0000 mg | ORAL_TABLET | Freq: Every day | ORAL | Status: DC
  Administered 2020-09-21 – 2020-09-26 (×6): 40 mg via ORAL
  Filled 2020-09-21 (×7): qty 1

## 2020-09-21 MED ORDER — EZETIMIBE 10 MG PO TABS
10.0000 mg | ORAL_TABLET | Freq: Every day | ORAL | Status: DC
  Administered 2020-09-22 – 2020-09-26 (×5): 10 mg via ORAL
  Filled 2020-09-21 (×6): qty 1

## 2020-09-21 MED ORDER — HEPARIN BOLUS VIA INFUSION
4000.0000 [IU] | Freq: Once | INTRAVENOUS | Status: AC
  Administered 2020-09-21: 4000 [IU] via INTRAVENOUS
  Filled 2020-09-21: qty 4000

## 2020-09-21 MED ORDER — ASPIRIN 325 MG PO TABS
325.0000 mg | ORAL_TABLET | Freq: Every day | ORAL | Status: DC
  Administered 2020-09-22 – 2020-09-23 (×2): 325 mg via ORAL
  Filled 2020-09-21 (×2): qty 1

## 2020-09-21 MED ORDER — MORPHINE SULFATE (PF) 4 MG/ML IV SOLN
4.0000 mg | Freq: Once | INTRAVENOUS | Status: AC
  Administered 2020-09-21: 4 mg via INTRAVENOUS
  Filled 2020-09-21: qty 1

## 2020-09-21 MED ORDER — ACETAMINOPHEN 325 MG PO TABS
650.0000 mg | ORAL_TABLET | ORAL | Status: DC | PRN
  Administered 2020-09-22: 650 mg via ORAL
  Filled 2020-09-21: qty 2

## 2020-09-21 MED ORDER — ONDANSETRON HCL 4 MG/2ML IJ SOLN: 4.0000 mg | Freq: Four times a day (QID) | INTRAMUSCULAR | Status: DC | PRN

## 2020-09-21 MED ORDER — PROPRANOLOL HCL 80 MG PO TABS
80.0000 mg | ORAL_TABLET | Freq: Two times a day (BID) | ORAL | Status: DC
  Administered 2020-09-21 – 2020-09-26 (×10): 80 mg via ORAL
  Filled 2020-09-21 (×14): qty 1

## 2020-09-21 MED ORDER — GUAIFENESIN-DM 100-10 MG/5ML PO SYRP
15.0000 mL | ORAL_SOLUTION | ORAL | Status: DC | PRN
  Administered 2020-09-22 – 2020-09-23 (×4): 15 mL via ORAL
  Filled 2020-09-21 (×4): qty 15

## 2020-09-21 MED ORDER — MAGNESIUM HYDROXIDE 400 MG/5ML PO SUSP
30.0000 mL | Freq: Every day | ORAL | Status: DC | PRN
  Administered 2020-09-21 – 2020-09-23 (×2): 30 mL via ORAL
  Filled 2020-09-21 (×4): qty 30

## 2020-09-21 MED ORDER — INSULIN ASPART 100 UNIT/ML ~~LOC~~ SOLN
0.0000 [IU] | Freq: Three times a day (TID) | SUBCUTANEOUS | Status: DC
  Administered 2020-09-22 – 2020-09-23 (×3): 2 [IU] via SUBCUTANEOUS
  Administered 2020-09-25: 3 [IU] via SUBCUTANEOUS
  Administered 2020-09-26: 2 [IU] via SUBCUTANEOUS

## 2020-09-21 NOTE — ED Notes (Signed)
Attempted to call report

## 2020-09-21 NOTE — Progress Notes (Signed)
[place] for Heparin Indication: chest pain/ACS  Allergies  Allergen Reactions  . Amoxicillin Itching    Did it involve swelling of the face/tongue/throat, SOB, or low BP? Unknown Did it involve sudden or severe rash/hives, skin peeling, or any reaction on the inside of your mouth or nose? Unknown Did you need to seek medical attention at a hospital or doctor's office? Unknown When did it last happen?20 years ago If all above answers are "NO", may proceed with cephalosporin use.   Marland Kitchen Hydromorphone Itching    Patient states he may take with benadryl. Makes feet itch.  Marland Kitchen Percocet [Oxycodone-Acetaminophen] Itching and Other (See Comments)    Legs only. Itching and burning feeling.  . Chlorhexidine Hives    Patient has blistering  . Gabapentin Nausea And Vomiting    Stomach issues     Patient Measurements: Height: 5\' 10"  (177.8 cm) Weight: 87 kg (191 lb 12.8 oz) IBW/kg (Calculated) : 73 Heparin Dosing Weight: 87 kg  Vital Signs: Temp: 97.8 F (36.6 C) (12/03 1300) Temp Source: Oral (12/03 1300) BP: 160/95 (12/03 1515) Pulse Rate: 86 (12/03 1515)  Labs: Recent Labs    09/21/20 1400  HGB 12.4*  HCT 39.2  PLT 202  CREATININE 5.10*  TROPONINIHS 1,701*    Estimated Creatinine Clearance: 17.1 mL/min (A) (by C-G formula based on SCr of 5.1 mg/dL (H)).   Medical History: Past Medical History:  Diagnosis Date  . Abscess of right foot   . Acute osteomyelitis of right foot ([place]) 05/15/2019  . Anemia 06/23/2019  . Cellulitis of right foot 05/22/2019  . Chest pain 08/23/2013  . Chills    at night - sometimes  . Complication of anesthesia   . Diabetes mellitus    controlled by diet  . 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 Diabetic foot ulcer- right second toe 05/14/2019  . Diabetic infection of right foot ([place])   . Diabetic retinopathy associated with type 2 diabetes mellitus ([place]) 06/23/2019  . Eczema   . ESRD on  dialysis ([place]) 07/18/2011  . Essential hypertension 10/18/2007   Qualifier: Diagnosis of  By: Loanne Drilling MD, Jacelyn Pi   . Fatigue    loss of fatigue  . FOOT ULCER, LEFT 06/29/2008   Qualifier: Diagnosis of  By: Loanne Drilling MD, Sean A   . Gangrene of toe of right foot ([place])   . Headache(784.0)    Years ago  . Heart murmur    years ago  . Hemodialysis status ([place]) 08/23/2013  . History of blood transfusion   . Hyperglycemia 05/05/2015  . HYPERLIPIDEMIA 05/16/2008   Qualifier: Diagnosis of  By: Jenny Reichmann MD, Hunt Oris   . Hypertension    sees Dr. Jaci Standard  . Hypoglycemia, unspecified 11/13/2008   Qualifier: Diagnosis of  By: Jenny Reichmann MD, Hunt Oris   . MRSA (methicillin resistant staph aureus) culture positive   . MRSA INFECTION 10/18/2007   Qualifier: Diagnosis of  By: Marca Ancona RMA, Lucy    . Obesity, unspecified 08/23/2013  . Osteomyelitis of great toe of right foot ([place]) 05/14/2019  . Other forms of retinal detachment(361.89) 06/23/2019  . PONV (postoperative nausea and vomiting)   . Poor circulation   . Renal disorder   . RENAL INSUFFICIENCY 07/31/2008   Qualifier: Diagnosis of  By: Jenny Reichmann MD, Hunt Oris   . Secondary hyperparathyroidism ([place]) 06/23/2019  . Sepsis ([place]) 05/14/2019  . Type 2 diabetes mellitus ([place]) 08/23/2013  . Ulcer of right foot, with necrosis of  bone ([place])   . Ulcer of right second toe with necrosis of bone ([facility])     Medications:  Scheduled:  . aspirin  324 mg Oral Once  . heparin  4,000 Units Intravenous Once  .  morphine injection  4 mg Intravenous Once    Assessment: Patient is a 29 yom that is being admitted for chest pain. The patient has a hx of CAD and ESRD on HD (MWF). Pharmacy has been asked to dose heparin at this time for ACS. Trop significantly elevated > 1000.   Goal of Therapy:  Heparin level 0.3-0.7 units/ml Monitor platelets by anticoagulation protocol: Yes   Plan:  - Heparin bolus 4000 units IV x 1 dose - Heparin drip @ 1050 units/hr - Heparin level in ~ 8 hours  -  Monitor patient for s/s of bleeding and CBC while on heparin   Duanne Limerick PharmD. BCPS  09/21/2020,3:38 PM

## 2020-09-21 NOTE — ED Triage Notes (Signed)
Pt BIBA from home. Pt went to have a bowel movement then started to have SOB and chest pain with some dizziness. Pt had similar episode on Thanksgiving. Pt is scheduled to have a CABG at [facility] unknown date. Dialysis patient. Rec'd full dialysis today with no complications. Resp even and unlabored. Pt rec'd 3 rounds of Nitro. Pain decreased from a 9 to a 6.

## 2020-09-21 NOTE — ED Notes (Signed)
X-ray at bedside

## 2020-09-21 NOTE — ED Provider Notes (Signed)
Care assumed from Carmon Sails, PA-C at shift change with labs pending.  In brief, this patient is a 54 y.o. M with PMH/o ESRD (MWF dialysis), HTN, HLD, DM, CAD who presents for evaluation of chest pain that began today at around 10:30-11am. He reports that he was in his bathroom and had a bowel movement. When he went to walk away, he started having chest pain that he said was 10/10. He did not have any associated SOB, diaphoresis, nausea/vomiting. He had 3x nitro en route which improved his pain to a 7/10. He has had some recent nasal congestion, cough. He is not vaccinated for COVID. He had a recent cath by [facility] in Oct 2021 which showed 3 vessel disease. His anatomy was nota good candidate for re-vascularization so they have decided to medically manage it. Please see note from previous provider for full history/physical exam.    Physical Exam  BP (!) 151/84   Pulse 83   Temp 97.8 F (36.6 C) (Oral)   Resp 20   Ht '5\' 10"'  (1.778 m)   Wt 87 kg   SpO2 92%   BMI 27.52 kg/m   Physical Exam  NAD, No evidence of respiratory distress.   ED Course/Procedures   Clinical Course as of Sep 21 1650  Fri Sep 21, 2020  1402 IMPRESSION: Minimal patchy bibasilar opacities, likely atelectasis    DG Chest Portable 1 View [CG]    Clinical Course User Index [CG] Kinnie Feil, PA-C    .Critical Care Performed by: Volanda Napoleon, PA-C Authorized by: Volanda Napoleon, PA-C   Critical care provider statement:    Critical care time (minutes):  45   Critical care was necessary to treat or prevent imminent or life-threatening deterioration of the following conditions: NSTEMI.   Critical care was time spent personally by me on the following activities:  Discussions with consultants, evaluation of patient's response to treatment, examination of patient, ordering and performing treatments and interventions, ordering and review of laboratory studies, ordering and review of radiographic  studies, pulse oximetry, re-evaluation of patient's condition, obtaining history from patient or surrogate and review of old charts     Results for orders placed or performed during the hospital encounter of 09/21/20 (from the past 24 hour(s))  Resp Panel by RT-PCR (Flu A&B, Covid) Nasopharyngeal Swab     Status: None   Collection Time: 09/21/20  1:37 PM   Specimen: Nasopharyngeal Swab; Nasopharyngeal(NP) swabs in vial transport medium  Result Value Ref Range   SARS Coronavirus 2 by RT PCR NEGATIVE NEGATIVE   Influenza A by PCR NEGATIVE NEGATIVE   Influenza B by PCR NEGATIVE NEGATIVE  Basic metabolic panel     Status: Abnormal   Collection Time: 09/21/20  2:00 PM  Result Value Ref Range   Sodium 137 135 - 145 mmol/L   Potassium 3.8 3.5 - 5.1 mmol/L   Chloride 93 (L) 98 - 111 mmol/L   CO2 27 22 - 32 mmol/L   Glucose, Bld 256 (H) 70 - 99 mg/dL   BUN 19 6 - 20 mg/dL   Creatinine, Ser 5.10 (H) 0.61 - 1.24 mg/dL   Calcium 9.7 8.9 - 10.3 mg/dL   GFR, Estimated 13 (L) >60 mL/min   Anion gap 17 (H) 5 - 15  CBC     Status: Abnormal   Collection Time: 09/21/20  2:00 PM  Result Value Ref Range   WBC 5.8 4.0 - 10.5 K/uL   RBC 4.39 4.22 -  5.81 MIL/uL   Hemoglobin 12.4 (L) 13.0 - 17.0 g/dL   HCT 39.2 39 - 52 %   MCV 89.3 80.0 - 100.0 fL   MCH 28.2 26.0 - 34.0 pg   MCHC 31.6 30.0 - 36.0 g/dL   RDW 19.2 (H) 11.5 - 15.5 %   Platelets 202 150 - 400 K/uL   nRBC 0.0 0.0 - 0.2 %  Troponin I (High Sensitivity)     Status: Abnormal   Collection Time: 09/21/20  2:00 PM  Result Value Ref Range   Troponin I (High Sensitivity) 1,701 (HH) <18 ng/L  Hepatic function panel     Status: Abnormal   Collection Time: 09/21/20  2:00 PM  Result Value Ref Range   Total Protein 7.7 6.5 - 8.1 g/dL   Albumin 3.6 3.5 - 5.0 g/dL   AST 37 15 - 41 U/L   ALT 24 0 - 44 U/L   Alkaline Phosphatase 185 (H) 38 - 126 U/L   Total Bilirubin 1.4 (H) 0.3 - 1.2 mg/dL   Bilirubin, Direct 0.3 (H) 0.0 - 0.2 mg/dL   Indirect  Bilirubin 1.1 (H) 0.3 - 0.9 mg/dL  Lipase, blood     Status: None   Collection Time: 09/21/20  2:00 PM  Result Value Ref Range   Lipase 23 11 - 51 U/L  Troponin I (High Sensitivity)     Status: Abnormal   Collection Time: 09/21/20  3:32 PM  Result Value Ref Range   Troponin I (High Sensitivity) 5,089 (HH) <18 ng/L     MDM   PLAN: Patient pending labs.   MDM:  Trop is elevated at 1701. Lipase is normal. LFTs show Alk phos of 185. CBC shows no leukocytosis. Hgb is 12.4. BMP shows BUN of 19, Cr 5.10. COVID is negative.   Discussed with cards who will come see the patient. Patient still with pain at 5/10. Will started on heparin and nitro.   Discussed patient with Bhagat ([facility] PA).  Given patient's complex medical issues, they are requesting medicine to admit.  They will plan to consult and take patient to the cath on Monday.  They are aware of patient's delta troponin of 5000.  Discussed patient with Dr. Vernell Barrier (hospitalist) who accepts patient for admission.    1. Chest pain with high risk for cardiac etiology   2. NSTEMI (non-ST elevated myocardial infarction) [facility])    Portions of this note were generated with Dragon dictation software. Dictation errors may occur despite best attempts at proofreading.     Volanda Napoleon, PA-C 09/21/20 1717    Tegeler, Gwenyth Allegra, MD 09/22/20 0001

## 2020-09-21 NOTE — ED Notes (Signed)
Ria Comment PA at bedside.

## 2020-09-21 NOTE — Progress Notes (Signed)
[facility] for Heparin Indication: chest pain/ACS  Allergies  Allergen Reactions  . Amoxicillin Itching    Did it involve swelling of the face/tongue/throat, SOB, or low BP? Unknown Did it involve sudden or severe rash/hives, skin peeling, or any reaction on the inside of your mouth or nose? Unknown Did you need to seek medical attention at a hospital or doctor's office? Unknown When did it last happen?20 years ago If all above answers are "NO", may proceed with cephalosporin use.   Marland Kitchen Hydromorphone Itching    Patient states he may take with benadryl. Makes feet itch.  Marland Kitchen Percocet [Oxycodone-Acetaminophen] Itching and Other (See Comments)    Legs only. Itching and burning feeling.  . Chlorhexidine Hives    Patient has blistering  . Gabapentin Nausea And Vomiting    Stomach issues     Patient Measurements: Height: 5\' 10"  (177.8 cm) Weight: 87 kg (191 lb 12.8 oz) IBW/kg (Calculated) : 73 Heparin Dosing Weight: 87 kg  Vital Signs: Temp: 98.6 F (37 C) (12/03 1952) Temp Source: Oral (12/03 1952) BP: 153/83 (12/03 2155) Pulse Rate: 84 (12/03 1952)  Labs: Recent Labs    09/21/20 1400 09/21/20 1532 09/21/20 2000 09/21/20 2236  HGB 12.4*  --   --   --   HCT 39.2  --   --   --   PLT 202  --   --   --   HEPARINUNFRC  --   --   --  0.14*  CREATININE 5.10*  --   --   --   TROPONINIHS 1,701* 5,089* 13,653*  --     Estimated Creatinine Clearance: 17.1 mL/min (A) (by C-G formula based on SCr of 5.1 mg/dL (H)).   Medical History: Past Medical History:  Diagnosis Date  . Abscess of right foot   . Acute osteomyelitis of right foot ([place]) 05/15/2019  . Anemia 06/23/2019  . Cellulitis of right foot 05/22/2019  . Chest pain 08/23/2013  . Chills    at night - sometimes  . Complication of anesthesia   . Diabetes mellitus    controlled by diet  . 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 Diabetic foot ulcer- right second toe  05/14/2019  . Diabetic infection of right foot ([place])   . Diabetic retinopathy associated with type 2 diabetes mellitus ([place]) 06/23/2019  . Eczema   . ESRD on dialysis ([place]) 07/18/2011  . Essential hypertension 10/18/2007   Qualifier: Diagnosis of  By: Loanne Drilling MD, Jacelyn Pi   . Fatigue    loss of fatigue  . FOOT ULCER, LEFT 06/29/2008   Qualifier: Diagnosis of  By: Loanne Drilling MD, Sean A   . Gangrene of toe of right foot ([place])   . Headache(784.0)    Years ago  . Heart murmur    years ago  . Hemodialysis status ([place]) 08/23/2013  . History of blood transfusion   . Hyperglycemia 05/05/2015  . HYPERLIPIDEMIA 05/16/2008   Qualifier: Diagnosis of  By: Jenny Reichmann MD, Hunt Oris   . Hypertension    sees Dr. Jaci Standard  . Hypoglycemia, unspecified 11/13/2008   Qualifier: Diagnosis of  By: Jenny Reichmann MD, Hunt Oris   . MRSA (methicillin resistant staph aureus) culture positive   . MRSA INFECTION 10/18/2007   Qualifier: Diagnosis of  By: Marca Ancona RMA, Lucy    . Obesity, unspecified 08/23/2013  . Osteomyelitis of great toe of right foot ([place]) 05/14/2019  . Other forms of retinal detachment(361.89) 06/23/2019  . PONV (postoperative  nausea and vomiting)   . Poor circulation   . Renal disorder   . RENAL INSUFFICIENCY 07/31/2008   Qualifier: Diagnosis of  By: Jenny Reichmann MD, Hunt Oris   . Secondary hyperparathyroidism ([place]) 06/23/2019  . Sepsis ([place]) 05/14/2019  . Type 2 diabetes mellitus ([place]) 08/23/2013  . Ulcer of right foot, with necrosis of bone ([place])   . Ulcer of right second toe with necrosis of bone ([facility])     Medications:  Scheduled:  . amLODipine  10 mg Oral QHS  . aspirin  324 mg Oral Once  . [START ON 09/22/2020] aspirin  325 mg Oral Daily  . atorvastatin  40 mg Oral Daily  . ezetimibe  10 mg Oral Daily  . hydrALAZINE  25 mg Oral Q8H  . insulin aspart  0-5 Units Subcutaneous QHS  . [START ON 09/22/2020] insulin aspart  0-9 Units Subcutaneous TID WC  . propranolol  80 mg Oral BID  . [START ON 09/22/2020] sevelamer carbonate  800  mg Oral TID WC    Assessment: Patient is a 75 yom that is being admitted for chest pain. The patient has a hx of CAD and ESRD on HD (MWF). Pharmacy has been asked to dose heparin at this time for ACS. Trop significantly elevated > 1000.   12/3 PM update: Heparin level low No issues per RN Troponin rising  Goal of Therapy:  Heparin level 0.3-0.7 units/ml Monitor platelets by anticoagulation protocol: Yes   Plan:  -Inc heparin to 1250 units/hr -Re-check heparin level in 6-8 hours  Narda Bonds, PharmD, [place] Pharmacist Phone: [phone]

## 2020-09-21 NOTE — ED Notes (Signed)
Unable to obtain IV access. RN narrating and Casey Burkitt attempted a total of 4 times for access and EMS attempted 3 times as well. Pt in NAD. Pt states understanding for need of IV. IV team consult placed.

## 2020-09-21 NOTE — Consult Note (Addendum)
Cardiology Consultation:   Patient ID: [name] MRN: [id number]; DOB: [birth year]  Admit date: 09/21/2020 Date of Consult: 09/21/2020  Primary Care Provider: [facility] Cardiologist: Candee Furbish, MD    Patient Profile:   [name] is a 54 y.o. male with a hx of  medically managed CAD, hypertension, diabetes mellitus, hyperlipidemia, chronic diastolic dysfunction, end-stage renal disease on hemodialysis Monday Wednesday Friday, peripheral vascular disease with prior toe amputation who is being seen today for the evaluation of Chest pain  at the request of Dr. Sherry Ruffing.   He was previously evaluated in 06/2019 for chest pain and stress test recommended but he did not have this done. He was recently seen by vascular at [facility] pending evaluation for left leg percutaneous revascularization. 2D echo showed normal LVEF, grade 3 DD, moderate TR.  Most recently admitted October 2021 for unstable angina in setting of elevated blood pressure. Underwent cardiac cath as below: 1. Severe 3 vessel obstructive disease involving multiple branch vessels.  2. Severely elevated LV filling pressures. 30-33 mmHg 3. Severe pulmonary HTN- mean PAP 54 mm Hg 4. Good cardiac output. Index 5.33.   Diagnostic Dominance: Right    Plan: need to optimize medical therapy and volume status. His anatomy is poorly suited for revascularization due to small branch vessel disease. Only the second OM looks suitable for PCI and this alone would not significantly improve his outcome.   He was discharged on aspirin 81 mg daily, Lipitor 40 mg daily, amlodipine 10 mg daily and propranolol 80 mg twice daily.  He was seen by [facility] cardiology August 23, 2020 for second opinion of his CAD.  Plan to start medical therapy with aspirin, beta-blocker, statin and addition of Zetia.  Further recommendation after getting cath films from [facility] health.   History of Present Illness:   [name]  has missed dialysis during Thanksgiving while he was traveling to [place].  He underwent 4  dialysis this week on Monday, Wednesday, Thursday and today.   He had a normal sessions.  After dialysis he went home. He tried to have a bowel movement but did not due to constipation.  Then with walking, he started to having substernal chest pressure with diaphoresis and shortness of breath.  No radiation, nausea or vomiting.  EMS was called and he got three sublingual nitroglycerin in route.  Chest pain improved from 8 out of 10 to 6 out of 10 during my evaluation.  He was laying comfortably.  Reported recent nasal congestion and cough.  He has not been vaccinated for Covid.  Patient reports this is his worst episode of chest pain.  High-sensitivity troponin 1701.  Potassium 3.8.  Creatinine 5.1.  Respiratory panel negative for influenza and Covid.  Chest x-ray with likely atelectasis.   Past Medical History:  Diagnosis Date  . Abscess of right foot   . Acute osteomyelitis of right foot ([place]) 05/15/2019  . Anemia 06/23/2019  . Cellulitis of right foot 05/22/2019  . Chest pain 08/23/2013  . Chills    at night - sometimes  . Complication of anesthesia   . Diabetes mellitus    controlled by diet  . 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 Diabetic foot ulcer- right second toe 05/14/2019  . Diabetic infection of right foot ([name])   . Diabetic retinopathy associated with type 2 diabetes mellitus ([place]) 06/23/2019  . Eczema   . ESRD on dialysis ([place]) 07/18/2011  . Essential hypertension 10/18/2007  Qualifier: Diagnosis of  By: Loanne Drilling MD, Jacelyn Pi Fatigue    loss of fatigue  . FOOT ULCER, LEFT 06/29/2008   Qualifier: Diagnosis of  By: Loanne Drilling MD, Sean A   . Gangrene of toe of right foot ([employer])   . Headache(784.0)    Years ago  . Heart murmur    years ago  . Hemodialysis status ([place]) 08/23/2013  . History of blood transfusion   . Hyperglycemia 05/05/2015  . HYPERLIPIDEMIA 05/16/2008    Qualifier: Diagnosis of  By: Jenny Reichmann MD, Hunt Oris   . Hypertension    sees Dr. Jaci Standard  . Hypoglycemia, unspecified 11/13/2008   Qualifier: Diagnosis of  By: Jenny Reichmann MD, Hunt Oris   . MRSA (methicillin resistant staph aureus) culture positive   . MRSA INFECTION 10/18/2007   Qualifier: Diagnosis of  By: Marca Ancona RMA, Lucy    . Obesity, unspecified 08/23/2013  . Osteomyelitis of great toe of right foot ([place]) 05/14/2019  . Other forms of retinal detachment(361.89) 06/23/2019  . PONV (postoperative nausea and vomiting)   . Poor circulation   . Renal disorder   . RENAL INSUFFICIENCY 07/31/2008   Qualifier: Diagnosis of  By: Jenny Reichmann MD, Hunt Oris   . Secondary hyperparathyroidism ([place]) 06/23/2019  . Sepsis ([place]) 05/14/2019  . Type 2 diabetes mellitus ([place]) 08/23/2013  . Ulcer of right foot, with necrosis of bone ([name])   . Ulcer of right second toe with necrosis of bone [facility])     Past Surgical History:  Procedure Laterality Date  . ABDOMINAL AORTOGRAM N/A 05/17/2019   Procedure: ABDOMINAL AORTOGRAM;  Surgeon: Elam Dutch, MD;  Location: [place] [facility];  Service: Cardiovascular;  Laterality: N/A;  . AMPUTATION TOE Right 05/14/2019   Procedure: PARTIAL AMPUTATION SECOND TOE RIGHT FOOT;  Surgeon: Evelina Bucy, DPM;  Location: [place];  Service: Podiatry;  Laterality: Right;  . AMPUTATION TOE Right 05/23/2019   Procedure: AMPUTATION OF SECOND TOE METATARSAL PHALANGEAL JOINT;  Surgeon: Evelina Bucy, DPM;  Location: [place];  Service: Podiatry;  Laterality: Right;  . AMPUTATION TOE Right 05/27/2019   Procedure: Amputation Toe;  Surgeon: Evelina Bucy, DPM;  Location: [place];  Service: Podiatry;  Laterality: Right;  right third toe  . APPLICATION OF WOUND VAC Right 05/27/2019   Procedure: Application Of Wound Vac;  Surgeon: Evelina Bucy, DPM;  Location: [place];  Service: Podiatry;  Laterality: Right;  . AV FISTULA PLACEMENT  06-18-11   Right brachiocephalic AVF  . BONE BIOPSY Right 05/14/2019   Procedure:  OPEN SUPERFICIAL BONE BIOPSY GREAT TOE;  Surgeon: Evelina Bucy, DPM;  Location: [place];  Service: Podiatry;  Laterality: Right;  . EYE SURGERY     left eye for Laser, diabetic retinopathy  . FISTULOGRAM Right 11/06/2011   Procedure: FISTULOGRAM;  Surgeon: Conrad Enterprise, MD;  Location: [facility] CATH LAB;  Service: Cardiovascular;  Laterality: Right;  . HEMATOMA EVACUATION Right Feb. 25, 2014  . HEMATOMA EVACUATION Right 12/14/2012   Procedure: EVACUATION HEMATOMA;  Surgeon: Conrad Osmond, MD;  Location: [place];  Service: Vascular;  Laterality: Right;  . INCISION AND DRAINAGE Right 05/23/2019   Procedure: INCISION AND DRAINAGE OF RIGHT FOOT DEEP SPACE ABSCESS;  Surgeon: Evelina Bucy, DPM;  Location: [place];  Service: Podiatry;  Laterality: Right;  . INSERTION OF DIALYSIS CATHETER  12/14/2012   Procedure: INSERTION OF DIALYSIS CATHETER;  Surgeon: Conrad [place], MD;  Location: [place];  Service: Vascular;;  .  IRRIGATION AND DEBRIDEMENT FOOT Right 05/25/2019   Procedure: INCISION AND DRAINAGE FOOT;  Surgeon: Evelina Bucy, DPM;  Location: [place];  Service: Podiatry;  Laterality: Right;  . IRRIGATION AND DEBRIDEMENT FOOT Right 05/27/2019   Procedure: IRRIGATION AND DEBRIDEMENT FOOT;  Surgeon: Evelina Bucy, DPM;  Location: [place];  Service: Podiatry;  Laterality: Right;  . LOWER EXTREMITY ANGIOGRAPHY Bilateral 05/17/2019   Procedure: LOWER EXTREMITY ANGIOGRAPHY;  Surgeon: Elam Dutch, MD;  Location: [place] [facility];  Service: Cardiovascular;  Laterality: Bilateral;  . REVISON OF ARTERIOVENOUS FISTULA Right 12/14/2012   Procedure: REVISON OF upper arm ARTERIOVENOUS FISTULA using 56mmx10cm gortex graft;  Surgeon: Conrad [place], MD;  Location: [place];  Service: Vascular;  Laterality: Right;  . RIGHT/LEFT HEART CATH AND CORONARY ANGIOGRAPHY N/A 08/07/2020   Procedure: RIGHT/LEFT HEART CATH AND CORONARY ANGIOGRAPHY;  Surgeon: Martinique, Peter M, MD;  Location: [place] [facility];  Service: Cardiovascular;   Laterality: N/A;  . SOFT TISSUE MASS EXCISION     Right arm, Left leg  for MRSA infection     Inpatient Medications: Scheduled Meds: . aspirin  324 mg Oral Once   Continuous Infusions: . heparin 1,050 Units/hr (09/21/20 1615)  . nitroGLYCERIN 5 mcg/min (09/21/20 1626)   PRN Meds:  Allergies:    Allergies  Allergen Reactions  . Amoxicillin Itching    Did it involve swelling of the face/tongue/throat, SOB, or low BP? Unknown Did it involve sudden or severe rash/hives, skin peeling, or any reaction on the inside of your mouth or nose? Unknown Did you need to seek medical attention at a hospital or doctor's office? Unknown When did it last happen?20 years ago If all above answers are "NO", may proceed with cephalosporin use.   Marland Kitchen Hydromorphone Itching    Patient states he may take with benadryl. Makes feet itch.  Marland Kitchen Percocet [Oxycodone-Acetaminophen] Itching and Other (See Comments)    Legs only. Itching and burning feeling.  . Chlorhexidine Hives    Patient has blistering  . Gabapentin Nausea And Vomiting    Stomach issues     Social History:   Social History   Socioeconomic History  . Marital status: Married    Spouse name: Not on file  . Number of children: 2  . Years of education: Not on file  . Highest education level: Not on file  Occupational History  . Not on file  Tobacco Use  . Smoking status: Never Smoker  . Smokeless tobacco: Never Used  Vaping Use  . Vaping Use: Never used  Substance and Sexual Activity  . Alcohol use: No  . Drug use: No  . Sexual activity: Yes  Other Topics Concern  . Not on file  Social History Narrative  . Not on file   Social Determinants of Health   Financial Resource Strain:   . Difficulty of Paying Living Expenses: Not on file  Food Insecurity:   . Worried About Charity fundraiser in the Last Year: Not on file  . Ran Out of Food in the Last Year: Not on file  Transportation Needs:   . Lack of Transportation  (Medical): Not on file  . Lack of Transportation (Non-Medical): Not on file  Physical Activity:   . Days of Exercise per Week: Not on file  . Minutes of Exercise per Session: Not on file  Stress:   . Feeling of Stress : Not on file  Social Connections:   . Frequency of Communication with  Friends and Family: Not on file  . Frequency of Social Gatherings with Friends and Family: Not on file  . Attends Religious Services: Not on file  . Active Member of Clubs or Organizations: Not on file  . Attends Archivist Meetings: Not on file  . Marital Status: Not on file  Intimate Partner Violence:   . Fear of Current or Ex-Partner: Not on file  . Emotionally Abused: Not on file  . Physically Abused: Not on file  . Sexually Abused: Not on file    Family History:   Family History  Problem Relation Age of Onset  . Diabetes Mother      ROS:  Please see the history of present illness.  All other ROS reviewed and negative.     Physical Exam/Data:   Vitals:   09/21/20 1515 09/21/20 1530 09/21/20 1532 09/21/20 1615  BP: (!) 160/95 (!) 163/86  (!) 151/84  Pulse: 86 85  83  Resp: 19 (!) 25  20  Temp:      TempSrc:      SpO2: 94% 96%  92%  Weight:   87 kg   Height:   5\' 10"  (1.778 m)    No intake or output data in the 24 hours ending 09/21/20 1637 Last 3 Weights 09/21/2020 08/09/2020 08/08/2020  Weight (lbs) 191 lb 12.8 oz 199 lb 11.8 oz 202 lb 6.1 oz  Weight (kg) 87 kg 90.6 kg 91.8 kg     Body mass index is 27.52 kg/m.  General:  Well nourished, well developed, in no acute distress HEENT: normal Lymph: no adenopathy Neck: no JVD Endocrine:  No thryomegaly Vascular: No carotid bruits; FA pulses 2+ bilaterally without bruits  Cardiac:  normal S1, S2; RRR; + murmur  Lungs:  clear to auscultation bilaterally, no wheezing, rhonchi or rales  Abd: soft, nontender, no hepatomegaly  Ext: no edema Musculoskeletal:  No deformities, BUE and BLE strength normal and equal Skin:  warm and dry  Neuro:  CNs 2-12 intact, no focal abnormalities noted Psych:  Normal affect   EKG:  The EKG was personally reviewed and demonstrates:  Sinus rhythm with LVH and repolarization   Telemetry:  Telemetry was personally reviewed and demonstrates:  Seen in ER   Relevant CV Studies:  RIGHT/LEFT HEART CATH AND CORONARY ANGIOGRAPHY 07/2020  Conclusion    Mid LAD lesion is 50% stenosed.  Dist LAD lesion is 80% stenosed.  2nd Diag lesion is 95% stenosed.  Lat 2nd Diag lesion is 100% stenosed.  1st Diag lesion is 90% stenosed.  Ramus lesion is 90% stenosed.  1st Mrg lesion is 95% stenosed.  2nd Mrg lesion is 90% stenosed.  RPAV lesion is 80% stenosed.  Hemodynamic findings consistent with severe pulmonary hypertension.  LV end diastolic pressure is severely elevated.  1. Severe 3 vessel obstructive disease involving multiple branch vessels.  2. Severely elevated LV filling pressures. 30-33 mmHg 3. Severe pulmonary HTN- mean PAP 54 mm Hg 4. Good cardiac output. Index 5.33.   Plan: need to optimize medical therapy and volume status. His anatomy is poorly suited for revascularization due to small branch vessel disease. Only the second OM looks suitable for PCI and this alone would not significantly improve his outcome.   Echo 08/02/20 at [facility]     Size: Normal  Dissection: INDETERM FOR DISSECTION   AORTIC VALVE   Leaflets: Tricuspid       Morphology: MILDLY THICKENED   Mobility: PARTIALLY MOBILE  LEFT VENTRICLE                   Anterior: Normal     Size: Normal                 Lateral: Normal  Contraction: Normal                 Septal: Normal  Closest EF: >55%(Estimated) Calc.EF: 63% (3D)   Apical: Normal   LV masses: No Masses               Inferior: Normal      LVH: MILD LVH CONCENTRIC         Posterior: Normal  LV GLS(LOL):  -12.1%  Dias.FxClass: RESTRICTIVE FILLING PATTERN (GRADE 3) CORRESPONDS TO REVERSIBLE        RESTRICTIVE PATTERN   MITRAL VALVE   Leaflets: Normal         Mobility: Fully mobile  Morphology: Normal   LEFT ATRIUM     Size: MILDLY ENLARGED   LA masses: No masses        Normal IAS   MAIN PA     Size: Normal    PA Note: PA acceleration time = 75msec   PULMONIC VALVE  Morphology: Normal   Mobility: Fully Mobile   RIGHT VENTRICLE     Size: MILDLY ENLARGED      Free wall: Normal  Contraction: Normal          RV masses: No Masses     TAPSE:  1.4 cm, Normal Range [>= 1.6 cm]   TRICUSPID VALVE   Leaflets: Normal        Mobility: Fully mobile  Morphology: Normal   RIGHT ATRIUM     Size: Normal           RA Other: None   RA masses: No masses   PERICARDIUM     Fluid: No effusion   INFERIOR VENACAVA     Size: Normal   ABNORMAL RESPIRATORY COLLAPSE   DOPPLER ECHO and OTHER SPECIAL PROCEDURES ------------------------------------   Aortic: No AR         No AS    Mitral: TRIVIAL MR       No MS   MV Inflow E Vel.= 125.0 cm/s MV Annulus E'Vel.= 9.0 cm/s E/E'Ratio= 14   Tricuspid: MODERATE TR      No TS       3.6 m/s peak TR vel  59 mmHg peak RV pressure   Pulmonary: TRIVIAL PR       No PS    Other:   INTERPRETATION ---------------------------------------------------------------  NORMAL LEFT VENTRICULAR SYSTOLIC FUNCTION WITH MILD LVH  ELEVATED LA PRESSURES WITH DIASTOLIC DYSFUNCTION  NORMAL RIGHT VENTRICULAR SYSTOLIC FUNCTION  VALVULAR REGURGITATION: TRIVIAL MR, TRIVIAL PR, MODERATE TR  NO VALVULAR STENOSIS  Aortic valve sclerosis present  NO PRIOR STUDY FOR COMPARISON  3D acquisition and reconstructions were performed as part of this  examination to more accurately quantify the effects of  identified  structural abnormalities as part of the exam. (post-processing on an  Independent workstation).    Laboratory Data:  High Sensitivity Troponin:   Recent Labs  Lab 09/21/20 1400  TROPONINIHS 1,701*     Chemistry Recent Labs  Lab 09/21/20 1400  NA 137  K 3.8  CL 93*  CO2 27  GLUCOSE 256*  BUN 19  CREATININE 5.10*  CALCIUM 9.7  GFRNONAA 13*  ANIONGAP 17*    Recent Labs  Lab 09/21/20  1400  PROT 7.7  ALBUMIN 3.6  AST 37  ALT 24  ALKPHOS 185*  BILITOT 1.4*   Hematology Recent Labs  Lab 09/21/20 1400  WBC 5.8  RBC 4.39  HGB 12.4*  HCT 39.2  MCV 89.3  MCH 28.2  MCHC 31.6  RDW 19.2*  PLT 202   Radiology/Studies:  DG Chest Portable 1 View  Result Date: 09/21/2020 CLINICAL DATA:  chest pain EXAM: PORTABLE CHEST 1 VIEW COMPARISON:  08/05/2020 chest radiograph and prior. FINDINGS: Patchy bibasilar opacities. No pneumothorax or pleural effusion. Cardiomediastinal silhouette is unchanged. No acute osseous abnormality. IMPRESSION: Minimal patchy bibasilar opacities, likely atelectasis Electronically Signed   By: Primitivo Gauze M.D.   On: 09/21/2020 13:58    Assessment and Plan:   1. NSTEMI -Severe substernal chest pressure with shortness of breath and diaphoresis. -Symptoms improved to 6/10 after 3 sublingual nitroglycerin -High-sensitivity troponin 1700 -Recent cardiac catheterization showing three-vessel disease and medical management recommended -EKG without acute ischemic changes - Agree with IV heparin  - Admit by IM and cycle troponin  - Start nitro drip>> this helps with high blood pressure as well - Continue ASA, Statin, Zetia, propranolol (change to coreg?) and amlodipine   2. ESRD on HD -  He underwent 4  dialysis this week on Monday, Wednesday, Thursday and today.  3. HTN - BP elevated - says he hasn't taken his medications today - BP runs normal otherwise - Add nitro drip as above - Continue home antihypertensive  4.  DM - Per primary team   5. HLD -08/07/2020: Cholesterol 167; HDL 91; LDL Cholesterol 61; Triglycerides 73; VLDL 15  - Continue Lipitor and Zetia  TIMI Risk Score for Unstable Angina or Non-ST Elevation MI:   The patient's TIMI risk score is 5, which indicates a 26% risk of all cause mortality, new or recurrent myocardial infarction or need for urgent revascularization in the next 14 days.  For questions or updates, please contact [place] Please consult [email or web address] for contact info under    Jarrett Soho, [place]  09/21/2020 4:37 PM   Patient seen and examined with [facility] PA.  Agree as above, with the following exceptions and changes as noted below. [name] is a 54 yo male with severe 3 vessel obstructive CAD and anatomy that was not suitable for multivessel PCI due to small branch vessel disease, and severely elevated LVEDP and pulmonary hypertension. Echocardiogram performed at [facility] on 08/02/20 shows Normal biventricular function with moderate TR, aortic valve sclerosis, and RVSP 59 mmHg. He presents for chest pain that he describes as the worst he has ever felt. Troponin elevated to 1700, which is a change from prior relatively unremarkable troponins in the setting of ESRD with IHD. Gen: NAD, CV: RRR, 2/6 systolic ejection murmur, Lungs: clear, Abd: soft, Extrem: warm, no edema, Neuro/Psych: alert and oriented x 3, normal mood and affect. All available labs, radiology testing, previous records reviewed.  EKG shows sinus rhythm with LVH and probable ST depressions inferolaterally.  [name] has complex coronary artery disease with challenging targets for PCI.  However he is demonstrating evidence of NSTEMI today.  Given the nature of his coronary artery disease, this will need to be reviewed with interventional cardiology who can review his cath films and determine if cath on Monday is a viable strategy.  We will pursue an early invasive approach for his NSTEMI.  We will  continue IV heparin, and will continue IV nitroglycerin for blood pressure control.  His blood pressure was significantly elevated on presentation.  We will focus on medical management and titrate antianginals as noted above.  Elouise Munroe, MD 09/21/20 6:04 PM

## 2020-09-21 NOTE — H&P (Signed)
[facility] History and Physical  [name] XLK:[id number] DOB: 1966/03/25 DOA: 09/21/2020   PCP: [name] Ebbs, MD  Specialists: Dr. Hollie [name] is his nephrologist.  Followed by Dr. Marlou [name] with cardiology.  Chief Complaint: Chest pain  HPI: [name] is [name] 54 y.o. male with [name] past medical history of end-stage renal disease on hemodialysis on Monday Wednesday Friday, essential hypertension, coronary artery disease, history of diabetes though no longer on medications, chronic diastolic dysfunction, peripheral vascular disease who presented to the hospital complaining of chest pain.  Patient was recently hospitalized in October for unstable angina and underwent cardiac cath at that time which showed severe three-vessel obstructive disease involving multiple branch vessels.  Medical management was pursued.  Patient mentions that he went for his usual dialysis session this morning.  Came back home and was in the bathroom when he started experiencing pain in the left side of his chest.  It was [name] dull aching pain becoming 9 out of 10 in intensity.  Some radiation to the left arm but not to the jaw or the neck.  Felt lightheaded.  Did not have any syncopal episode.  He felt hot and sweaty.  Was short of breath as well.  Did not have any nausea vomiting.  Subsequently decided to seek medical attention.  In the emergency department he underwent evaluation including EKG and blood work.  Troponins were noted to be significantly elevated.  Cardiology was consulted.  Patient was placed on IV heparin and IV nitroglycerin with improvement in symptoms.  Pain is currently 4 out of 10 in intensity.  He will need hospitalization for further management.  Home Medications: Prior to Admission medications   Medication Sig Start Date End Date Taking? Authorizing Provider  amLODipine (NORVASC) 10 MG tablet Take 10 mg by mouth at bedtime.  08/03/20  Yes [provider]  aspirin EC 81 MG tablet Take 1  tablet (81 mg total) by mouth daily. 06/28/19  Yes [name] Hampshire, MD  atorvastatin (LIPITOR) 40 MG tablet Take 1 tablet (40 mg total) by mouth daily. 06/28/19 09/21/20 Yes [name] Hampshire, MD  ezetimibe (ZETIA) 10 MG tablet Take 10 mg by mouth daily. 08/23/20  Yes [provider]  hydrALAZINE (APRESOLINE) 25 MG tablet Take 1 tablet (25 mg total) by mouth every 8 (eight) hours. [name] not take morning of dialysis. Patient taking differently: Take 50 mg by mouth every 8 (eight) hours. [name] not take morning of dialysis. 08/09/20  Yes [name] Busman, MD  isosorbide dinitrate (ISORDIL) 20 MG tablet Take 20 mg by mouth 3 (three) times daily. 08/24/20  Yes [provider]  OVER THE COUNTER MEDICATION Take 2 capsules by mouth 2 (two) times daily. Zymessence   Yes [provider]  propranolol (INDERAL) 80 MG tablet Take 1 tablet (80 mg total) by mouth 2 (two) times daily. 08/09/20  Yes [name] Busman, MD  sevelamer carbonate (RENVELA) 800 MG tablet Take 800 mg by mouth 3 (three) times daily with meals. Just takes with meals 07/25/20  Yes [provider]  blood glucose meter kit and supplies Dispense based on patient and insurance preference. Use up to four times daily as directed. (FOR ICD-10 E10.9, E11.9). 05/18/19   [name], [name] Addison, [name]  blood glucose meter kit and supplies Dispense based on patient and insurance preference. Use up to four times daily as directed. (FOR ICD-9 250.00, 250.01). 05/18/19   [name] Ford, [name]  Continuous Blood Gluc Sensor (FREESTYLE LIBRE 14 DAY  SENSOR) MISC See admin instructions. 06/20/19   [provider]  glucose blood (ACCU-CHEK AVIVA PLUS) test strip Use as instructed 05/18/19   [name] Ford, [name]  Lancet Devices ([place]) lancets Use as instructed 05/18/19   [name] Ford, [name]  loratadine (CLARITIN) 10 MG tablet Take 10 mg by mouth daily as needed for allergies.  11/06/19  [provider]    Allergies:   Allergies  Allergen Reactions  . Amoxicillin Itching    Did it involve swelling of the face/tongue/throat, SOB, or low BP? Unknown Did it involve sudden or severe rash/hives, skin peeling, or any reaction on the inside of your mouth or nose? Unknown Did you need to seek medical attention at [name] hospital or doctor's office? Unknown When did it last happen?20 years ago If all above answers are "NO", may proceed with cephalosporin use.   Marland Kitchen Hydromorphone Itching    Patient states he may take with benadryl. Makes feet itch.  Marland Kitchen Percocet [Oxycodone-Acetaminophen] Itching and Other (See Comments)    Legs only. Itching and burning feeling.  . Chlorhexidine Hives    Patient has blistering  . Gabapentin Nausea And Vomiting    Stomach issues     Past Medical History: Past Medical History:  Diagnosis Date  . Abscess of right foot   . Acute osteomyelitis of right foot ([place]) 05/15/2019  . Anemia 06/23/2019  . Cellulitis of right foot 05/22/2019  . Chest pain 08/23/2013  . Chills    at night - sometimes  . Complication of anesthesia   . Diabetes mellitus    controlled by diet  . 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 Diabetic foot ulcer- right second toe 05/14/2019  . Diabetic infection of right foot ([place])   . Diabetic retinopathy associated with type 2 diabetes mellitus ([place]) 06/23/2019  . Eczema   . ESRD on dialysis ([place]) 07/18/2011  . Essential hypertension 10/18/2007   Qualifier: Diagnosis of  By: [name]   . Fatigue    loss of fatigue  . FOOT ULCER, LEFT 06/29/2008   Qualifier: Diagnosis of  By: [name]   . Gangrene of toe of right foot ([place])   . Headache(784.0)    Years ago  . Heart murmur    years ago  . Hemodialysis status ([place]) 08/23/2013  . History of blood transfusion   . Hyperglycemia 05/05/2015  . HYPERLIPIDEMIA 05/16/2008   Qualifier: Diagnosis of  By: [name]   . Hypertension    sees [name]  . Hypoglycemia, unspecified  11/13/2008   Qualifier: Diagnosis of  By: [name]   . MRSA (methicillin resistant staph aureus) culture positive   . MRSA INFECTION 10/18/2007   Qualifier: Diagnosis of  By: Marca Ancona RMA, Lucy    . Obesity, unspecified 08/23/2013  . Osteomyelitis of great toe of right foot ([place]) 05/14/2019  . Other forms of retinal detachment(361.89) 06/23/2019  . PONV (postoperative nausea and vomiting)   . Poor circulation   . Renal disorder   . RENAL INSUFFICIENCY 07/31/2008   Qualifier: Diagnosis of  By: [name]   . Secondary hyperparathyroidism ([place]) 06/23/2019  . Sepsis ([place]) 05/14/2019  . Type 2 diabetes mellitus ([place]) 08/23/2013  . Ulcer of right foot, with necrosis of bone ([place])   . Ulcer of right second toe with necrosis of bone [facility])     Past Surgical History:  Procedure Laterality Date  . ABDOMINAL AORTOGRAM N/[name] 05/17/2019  Procedure: ABDOMINAL AORTOGRAM;  Surgeon: Elam Dutch, MD;  Location: [place] [facility];  Service: Cardiovascular;  Laterality: N/[name];  . AMPUTATION TOE Right 05/14/2019   Procedure: PARTIAL AMPUTATION SECOND TOE RIGHT FOOT;  Surgeon: Evelina Bucy, DPM;  Location: [place];  Service: Podiatry;  Laterality: Right;  . AMPUTATION TOE Right 05/23/2019   Procedure: AMPUTATION OF SECOND TOE METATARSAL PHALANGEAL JOINT;  Surgeon: Evelina Bucy, DPM;  Location: [place];  Service: Podiatry;  Laterality: Right;  . AMPUTATION TOE Right 05/27/2019   Procedure: Amputation Toe;  Surgeon: Evelina Bucy, DPM;  Location: [place];  Service: Podiatry;  Laterality: Right;  right third toe  . APPLICATION OF WOUND VAC Right 05/27/2019   Procedure: Application Of Wound Vac;  Surgeon: Evelina Bucy, DPM;  Location: [place];  Service: Podiatry;  Laterality: Right;  . AV FISTULA PLACEMENT  06-18-11   Right brachiocephalic AVF  . BONE BIOPSY Right 05/14/2019   Procedure: OPEN SUPERFICIAL BONE BIOPSY GREAT TOE;  Surgeon: Evelina Bucy, DPM;  Location: [place];  Service: Podiatry;   Laterality: Right;  . EYE SURGERY     left eye for Laser, diabetic retinopathy  . FISTULOGRAM Right 11/06/2011   Procedure: FISTULOGRAM;  Surgeon: Conrad Denison, MD;  Location: [facility] CATH LAB;  Service: Cardiovascular;  Laterality: Right;  . HEMATOMA EVACUATION Right Feb. 25, 2014  . HEMATOMA EVACUATION Right 12/14/2012   Procedure: EVACUATION HEMATOMA;  Surgeon: Conrad [place], MD;  Location: [place];  Service: Vascular;  Laterality: Right;  . INCISION AND DRAINAGE Right 05/23/2019   Procedure: INCISION AND DRAINAGE OF RIGHT FOOT DEEP SPACE ABSCESS;  Surgeon: Evelina Bucy, DPM;  Location: [place];  Service: Podiatry;  Laterality: Right;  . INSERTION OF DIALYSIS CATHETER  12/14/2012   Procedure: INSERTION OF DIALYSIS CATHETER;  Surgeon: Conrad Quincy, MD;  Location: [place];  Service: Vascular;;  . IRRIGATION AND DEBRIDEMENT FOOT Right 05/25/2019   Procedure: INCISION AND DRAINAGE FOOT;  Surgeon: Evelina Bucy, DPM;  Location: [place];  Service: Podiatry;  Laterality: Right;  . IRRIGATION AND DEBRIDEMENT FOOT Right 05/27/2019   Procedure: IRRIGATION AND DEBRIDEMENT FOOT;  Surgeon: Evelina Bucy, DPM;  Location: [place];  Service: Podiatry;  Laterality: Right;  . LOWER EXTREMITY ANGIOGRAPHY Bilateral 05/17/2019   Procedure: LOWER EXTREMITY ANGIOGRAPHY;  Surgeon: Elam Dutch, MD;  Location: [place] [facility];  Service: Cardiovascular;  Laterality: Bilateral;  . REVISON OF ARTERIOVENOUS FISTULA Right 12/14/2012   Procedure: REVISON OF upper arm ARTERIOVENOUS FISTULA using 49mx10cm gortex graft;  Surgeon: BConrad [place] MD;  [place]  Service: Vascular;  Laterality: Right;  . RIGHT/LEFT HEART CATH AND CORONARY ANGIOGRAPHY N/[name] 08/07/2020   Procedure: RIGHT/LEFT HEART CATH AND CORONARY ANGIOGRAPHY;  Surgeon: JMartinique Peter M, MD;  [facility];  Service: Cardiovascular;  Laterality: N/[name];  . SOFT TISSUE MASS EXCISION     Right arm, Left leg  for MRSA infection    Social History: Lives  with his wife.  Denies smoking alcohol use or any recreational drug use.  Usually independent with daily activities  Family History:  Family History  Problem Relation Age of Onset  . Diabetes Mother   History of kidney disease also runs in his family  Review of Systems - History obtained from the patient General ROS: positive for  - fatigue Psychological ROS: negative Ophthalmic ROS: negative ENT ROS: negative Allergy and Immunology ROS: negative Hematological and Lymphatic  ROS: negative Endocrine ROS: negative Respiratory ROS: As in HPI Cardiovascular ROS: As in HPI Gastrointestinal ROS: no abdominal pain, change in bowel habits, or black or bloody stools Genito-Urinary ROS: no dysuria, trouble voiding, or hematuria Musculoskeletal ROS: negative Neurological ROS: no TIA or stroke symptoms Dermatological ROS: negative  Physical Examination  Vitals:   09/21/20 1615 09/21/20 1630 09/21/20 1645 09/21/20 1700  BP: (!) 151/84 (!) 159/90 (!) 150/82 (!) 147/81  Pulse: 83 86 82 83  Resp: _0 Temp:      TempSrc:      SpO2: 92% 94%  93%  Weight:      Height:        BP (!) 147/81   Pulse 83   Temp 97.8 F (36.6 C) (Oral)   Resp 15   Ht _1  (1.778 m)   Wt 87 kg   SpO2 93%   BMI 27.52 kg/m   General appearance: alert, cooperative, appears stated age and no distress Head: Normocephalic, without obvious abnormality, atraumatic Eyes: conjunctivae/corneas clear. PERRL, EOM's intact.  Throat: lips, mucosa, and tongue normal; teeth and gums normal Neck: no adenopathy, no carotid bruit, no JVD, supple, symmetrical, trachea midline and thyroid not enlarged, symmetric, no tenderness/mass/nodules Resp: clear to auscultation bilaterally Cardio: S1-S2 is normal regular.  Systolic murmur appreciated over the aortic area.  No S3-S4.  No rubs or bruit. GI: soft, non-tender; bowel sounds normal; no masses,  no organomegaly Extremities: Patient has had amputation of his toes on  the right lower extremity.  Pulses are weak but palpable.  He has [name] dressing over the right foot.  Pulses: Poorly palpable in the lower extremities Skin: Skin color, texture, turgor normal. No rashes or lesions Lymph nodes: Cervical, supraclavicular, and axillary nodes normal. Neurologic: Alert and oriented x3.  No obvious focal neurological deficits noted.    Labs on Admission: I have personally reviewed following labs and imaging studies  CBC: Recent Labs  Lab 09/21/20 1400  WBC 5.8  HGB 12.4*  HCT 39.2  MCV 89.3  PLT 409   Basic Metabolic Panel: Recent Labs  Lab 09/21/20 1400  NA 137  K 3.8  CL 93*  CO2 27  GLUCOSE 256*  BUN 19  CREATININE 5.10*  CALCIUM 9.7   GFR: Estimated Creatinine Clearance: 17.1 mL/min ([name]) (by C-G formula based on SCr of 5.1 mg/dL (H)). Liver Function Tests: Recent Labs  Lab 09/21/20 1400  AST 37  ALT 24  ALKPHOS 185*  BILITOT 1.4*  PROT 7.7  ALBUMIN 3.6   Recent Labs  Lab 09/21/20 1400  LIPASE 23     Radiological Exams on Admission: DG Chest Portable 1 View  Result Date: 09/21/2020 CLINICAL DATA:  chest pain EXAM: PORTABLE CHEST 1 VIEW COMPARISON:  08/05/2020 chest radiograph and prior. FINDINGS: Patchy bibasilar opacities. No pneumothorax or pleural effusion. Cardiomediastinal silhouette is unchanged. No acute osseous abnormality. IMPRESSION: Minimal patchy bibasilar opacities, likely atelectasis Electronically Signed   By: Primitivo Gauze M.D.   On: 09/21/2020 13:58    My interpretation of Electrocardiogram: Sinus rhythm in the 90s.  Normal axis.  QT prolongation is noted.  Subtle ST depression noted in inferior and lateral leads.   Problem List  Principal Problem:   NSTEMI (non-ST elevated myocardial infarction) [facility]) Active Problems:   Essential hypertension   ESRD on dialysis [facility])   Diabetic foot ulcer- right second toe   Assessment: This is [name] 54 year old with past medical history as stated earlier who  comes  in with chest pain.  He is noted to have significant elevation in his troponins.  He has subtle EKG changes.  He is thought to have [name] non-ST elevation MI.  Plan:  1. Non-ST elevation MI in the setting of known coronary artery disease: Patient underwent cardiac catheterization in October which showed severe triple-vessel disease.  Medical management was being pursued.  Comes back and with anginal symptoms.  Cardiology is consulted.  Continue with aspirin statin beta-blocker.  Defer changing his propranolol to another beta-blocker to cardiology.  Continue with amlodipine.  Patient currently on IV heparin and IV nitroglycerin which will be continued as well.  Further management per cardiology.  Discussed with Dr. Margaretann Loveless.  Tentative plan is for another cardiac catheterization early next week.  2.  End-stage renal disease on hemodialysis: Patient is dialyzed Monday Wednesday Friday.  He underwent dialysis this morning.  We will involve nephrology over the weekend as dialysis on Monday will need to be coordinated with his cardiac catheterization.  His electrolytes are all stable.  3.  Hyperglycemia in the setting of known history of diabetes mellitus type 2 with renal complications: Patient with history of diabetes although he mentions that he has not been on any medications due to good control of his diabetes with modification of diet.  HbA1c in October was 6.2.  Glucose level obtained today is not fasting.  We will check [name] fasting level tomorrow.  SSI will be ordered.  Check HbA1c again.  4.  Essential hypertension: Monitor blood pressures closely.  Continue with his home medications.  5.  History of peripheral vascular disease/diabetic foot ulcer: He has [name] dressing over his right foot.  He is followed by podiatry in [place].  Also seen by the vascular team at [facility].  Unclear if he has had any revascularization procedures or angiogram recently.  Cannot find this information on care everywhere.  Will involve  wound care nurse to assist with management of his right foot wound/ulcer.  6. Anemia of chronic kidney disease: Hemoglobin noted to be stable.  Continue to monitor on [name] daily basis.   DVT Prophylaxis: On IV heparin Code Status: Full code Family Communication: Discussed with the patient and his wife Disposition: Hopefully return home when improved Consults called: Cardiology Admission Status: Status is: Inpatient  Remains inpatient appropriate because:Ongoing active pain requiring inpatient pain management, Ongoing diagnostic testing needed not appropriate for outpatient work up, IV treatments appropriate due to intensity of illness or inability to take PO and Inpatient level of care appropriate due to severity of illness   Dispo: The patient is from: Home              Anticipated d/c is to: Home              Anticipated d/c date is: 3 days              Patient currently is not medically stable to d/c.     Severity of Illness: The appropriate patient status for this patient is INPATIENT. Inpatient status is judged to be reasonable and necessary in order to provide the required intensity of service to ensure the patient's safety. The patient's presenting symptoms, physical exam findings, and initial radiographic and laboratory data in the context of their chronic comorbidities is felt to place them at high risk for further clinical deterioration. Furthermore, it is not anticipated that the patient will be medically stable for discharge from the hospital within 2 midnights of admission. The  following factors support the patient status of inpatient.   " The patient's presenting symptoms include chest pain. " The worrisome physical exam findings include elevated blood pressure. " The initial radiographic and laboratory data are worrisome because of non-ST elevation MI. " The chronic co-morbidities include diabetes and hypertension.   * I certify that at the point of admission it is my  clinical judgment that the patient will require inpatient hospital care spanning beyond 2 midnights from the point of admission due to high intensity of service, high risk for further deterioration and high frequency of surveillance required.*    Further management decisions will depend on results of further testing and patient's response to treatment.   Amandalynn Pitz [employer]  [facility] operational officer on [email or web address]  09/21/2020, 5:43 PM

## 2020-09-21 NOTE — ED Provider Notes (Addendum)
[facility] EMERGENCY DEPARTMENT Provider Note   CSN: [id number] Arrival date & time: 09/21/20  1233     History Chief Complaint  Patient presents with  . Chest Pain    [name] is a 54 y.o. male with history of ESRD on MWF HD, hypertension, hyperlipidemia, diabetes, severe three-vessel CAD on recent heart cath, recent hospitalization for chest pain October 2021 presents to the ED for evaluation of chest pain.  This began suddenly at around 1030-11 AM today.  Patient states he had a bowel movement, stood up and started walking away from the bathroom when he noticed sudden, severe 10/10 central chest achiness that radiated to the left chest.  States he has never had such severe pain in his life.  He tried to lay down but states nothing was making his pain any better.  911 was called.  Patient received 3 nitroglycerin SL and reports improvement in chest pain now a 7/10.  Reports for the last 2 to 3 days he has had nasal congestion, postnasal drip and a productive cough.  Has not been vaccinated for COVID-19.  Traveled by car for Thanksgiving last week to see family.  Denies sick contacts.  Patient stays somewhat active. He will clean up the house, garage, rake leaves and states he gets slightly short of breath but has not had exertional CP. He denies fevers, sore throat, shortness of breath.  Denies diaphoresis, neck pain, arm pain, back pain.  He had a full dialysis session earlier today.  No extremity swelling, orthopnea or PND.  Patient states he had an initial consult with [facility] cardiology recently, he is supposed to drop off his heart cath results to the cardiologist there next week.  Reports history of pancreatitis many years ago.  Denies recent issues after eating, acid reflux. No tobacco use.  Patient states cardiology has told him he will likely need a CABG at some point.   HPI     Past Medical History:  Diagnosis Date  . Abscess of right foot   . Acute  osteomyelitis of right foot ([place]) 05/15/2019  . Anemia 06/23/2019  . Cellulitis of right foot 05/22/2019  . Chest pain 08/23/2013  . Chills    at night - sometimes  . Complication of anesthesia   . Diabetes mellitus    controlled by diet  . 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 Diabetic foot ulcer- right second toe 05/14/2019  . Diabetic infection of right foot ([place])   . Diabetic retinopathy associated with type 2 diabetes mellitus ([place]) 06/23/2019  . Eczema   . ESRD on dialysis ([place]) 07/18/2011  . Essential hypertension 10/18/2007   Qualifier: Diagnosis of  By: Loanne Drilling MD, Jacelyn Pi   . Fatigue    loss of fatigue  . FOOT ULCER, LEFT 06/29/2008   Qualifier: Diagnosis of  By: Loanne Drilling MD, Sean A   . Gangrene of toe of right foot ([place])   . Headache(784.0)    Years ago  . Heart murmur    years ago  . Hemodialysis status ([place]) 08/23/2013  . History of blood transfusion   . Hyperglycemia 05/05/2015  . HYPERLIPIDEMIA 05/16/2008   Qualifier: Diagnosis of  By: Jenny Reichmann MD, Hunt Oris   . Hypertension    sees Dr. Jaci Standard  . Hypoglycemia, unspecified 11/13/2008   Qualifier: Diagnosis of  By: Jenny Reichmann MD, Hunt Oris   . MRSA (methicillin resistant staph aureus) culture positive   . MRSA INFECTION 10/18/2007   Qualifier: Diagnosis  of  By: Reatha Armour, Lorre Nick    . Obesity, unspecified 08/23/2013  . Osteomyelitis of great toe of right foot ([place]) 05/14/2019  . Other forms of retinal detachment(361.89) 06/23/2019  . PONV (postoperative nausea and vomiting)   . Poor circulation   . Renal disorder   . RENAL INSUFFICIENCY 07/31/2008   Qualifier: Diagnosis of  By: Jenny Reichmann MD, Hunt Oris   . Secondary hyperparathyroidism ([place]) 06/23/2019  . Sepsis ([place]) 05/14/2019  . Type 2 diabetes mellitus ([place]) 08/23/2013  . Ulcer of right foot, with necrosis of bone ([place])   . Ulcer of right second toe with necrosis of bone [facility])     Patient Active Problem List   Diagnosis Date Noted  . Pancytopenia ([place]) 08/06/2020  . Anemia  06/23/2019  . Diabetic retinopathy associated with type 2 diabetes mellitus ([place]) 06/23/2019  . Other forms of retinal detachment(361.89) 06/23/2019  . Secondary hyperparathyroidism ([place]) 06/23/2019  . Gangrene of toe of right foot ([place])   . Ulcer of right foot, with necrosis of bone ([place])   . Abscess of right foot   . Diabetic infection of right foot ([place])   . Ulcer of right second toe with necrosis of bone ([place])   . Cellulitis of right foot 05/22/2019  . Acute osteomyelitis of right foot ([place]) 05/15/2019  . Osteomyelitis of great toe of right foot ([place]) 05/14/2019  . Diabetic foot ulcer- right second toe 05/14/2019  . Sepsis ([place]) 05/14/2019  . Hyperlipidemia 05/11/2015  . Hyperglycemia 05/05/2015  . Chest pain 08/23/2013  . Type 2 diabetes mellitus ([place]) 08/23/2013  . Hemodialysis status ([place]) 08/23/2013  . Chronic kidney disease, stage V ([place]) 08/23/2013  . Obesity, unspecified 08/23/2013  . ESRD on dialysis ([place]) 07/18/2011  . HYPOGLYCEMIA, UNSPECIFIED 11/13/2008  . RENAL INSUFFICIENCY 07/31/2008  . FOOT ULCER, LEFT 06/29/2008  . HYPERLIPIDEMIA 05/16/2008  . MRSA INFECTION 10/18/2007  . Essential hypertension 10/18/2007    Past Surgical History:  Procedure Laterality Date  . ABDOMINAL AORTOGRAM N/A 05/17/2019   Procedure: ABDOMINAL AORTOGRAM;  Surgeon: Elam Dutch, MD;  Location: [facility];  Service: Cardiovascular;  Laterality: N/A;  . AMPUTATION TOE Right 05/14/2019   Procedure: PARTIAL AMPUTATION SECOND TOE RIGHT FOOT;  Surgeon: Evelina Bucy, DPM;  Location: [facility];  Service: Podiatry;  Laterality: Right;  . AMPUTATION TOE Right 05/23/2019   Procedure: AMPUTATION OF SECOND TOE METATARSAL PHALANGEAL JOINT;  Surgeon: Evelina Bucy, DPM;  Location: [facility];  Service: Podiatry;  Laterality: Right;  . AMPUTATION TOE Right 05/27/2019   Procedure: Amputation Toe;  Surgeon: Evelina Bucy, DPM;  Location: [facility];  Service: Podiatry;  Laterality: Right;  right  third toe  . APPLICATION OF WOUND VAC Right 05/27/2019   Procedure: Application Of Wound Vac;  Surgeon: Evelina Bucy, DPM;  Location: [facility];  Service: Podiatry;  Laterality: Right;  . AV FISTULA PLACEMENT  06-18-11   Right brachiocephalic AVF  . BONE BIOPSY Right 05/14/2019   Procedure: OPEN SUPERFICIAL BONE BIOPSY GREAT TOE;  Surgeon: Evelina Bucy, DPM;  Location: [facility];  Service: Podiatry;  Laterality: Right;  . EYE SURGERY     left eye for Laser, diabetic retinopathy  . FISTULOGRAM Right 11/06/2011   Procedure: FISTULOGRAM;  Surgeon: Conrad [place], MD;  Location: [facility] CATH LAB;  Service: Cardiovascular;  Laterality: Right;  . HEMATOMA EVACUATION Right Feb. 25, 2014  . HEMATOMA EVACUATION Right 12/14/2012   Procedure: EVACUATION HEMATOMA;  Surgeon: Jannette Fogo  Bridgett Larsson, MD;  Location: [facility];  Service: Vascular;  Laterality: Right;  . INCISION AND DRAINAGE Right 05/23/2019   Procedure: INCISION AND DRAINAGE OF RIGHT FOOT DEEP SPACE ABSCESS;  Surgeon: Evelina Bucy, DPM;  Location: [facility];  Service: Podiatry;  Laterality: Right;  . INSERTION OF DIALYSIS CATHETER  12/14/2012   Procedure: INSERTION OF DIALYSIS CATHETER;  Surgeon: Conrad [place], MD;  Location: [address];  Service: Vascular;;  . IRRIGATION AND DEBRIDEMENT FOOT Right 05/25/2019   Procedure: INCISION AND DRAINAGE FOOT;  Surgeon: Evelina Bucy, DPM;  Location: [facility];  Service: Podiatry;  Laterality: Right;  . IRRIGATION AND DEBRIDEMENT FOOT Right 05/27/2019   Procedure: IRRIGATION AND DEBRIDEMENT FOOT;  Surgeon: Evelina Bucy, DPM;  Location: [facility];  Service: Podiatry;  Laterality: Right;  . LOWER EXTREMITY ANGIOGRAPHY Bilateral 05/17/2019   Procedure: LOWER EXTREMITY ANGIOGRAPHY;  Surgeon: Elam Dutch, MD;  Location: [facility];  Service: Cardiovascular;  Laterality: Bilateral;  . REVISON OF ARTERIOVENOUS FISTULA Right 12/14/2012   Procedure: REVISON OF upper arm ARTERIOVENOUS FISTULA using 23mx10cm gortex graft;  Surgeon: BConrad [place] MD;  [place] [facility]  Service: Vascular;  Laterality: Right;  . RIGHT/LEFT HEART CATH AND CORONARY ANGIOGRAPHY N/A 08/07/2020   Procedure: RIGHT/LEFT HEART CATH AND CORONARY ANGIOGRAPHY;  Surgeon: JMartinique Peter M, MD;  [facility];  Service: Cardiovascular;  Laterality: N/A;  . SOFT TISSUE MASS EXCISION     Right arm, Left leg  for MRSA infection       Family History  Problem Relation Age of Onset  . Diabetes Mother     Social History   Tobacco Use  . Smoking status: Never Smoker  . Smokeless tobacco: Never Used  Vaping Use  . Vaping Use: Never used  Substance Use Topics  . Alcohol use: No  . Drug use: No    Home Medications Prior to Admission medications   Medication Sig Start Date End Date Taking? Authorizing Provider  amLODipine (NORVASC) 10 MG tablet Take 10 mg by mouth at bedtime.  08/03/20  Yes [provider]  aspirin EC 81 MG tablet Take 1 tablet (81 mg total) by mouth daily. 06/28/19  Yes AWellington Hampshire MD  atorvastatin (LIPITOR) 40 MG tablet Take 1 tablet (40 mg total) by mouth daily. 06/28/19 09/21/20 Yes AWellington Hampshire MD  ezetimibe (ZETIA) 10 MG tablet Take 10 mg by mouth daily. 08/23/20  Yes [provider]  hydrALAZINE (APRESOLINE) 25 MG tablet Take 1 tablet (25 mg total) by mouth every 8 (eight) hours. Do not take morning of dialysis. Patient taking differently: Take 50 mg by mouth every 8 (eight) hours. Do not take morning of dialysis. 08/09/20  Yes MFreida Busman MD  isosorbide dinitrate (ISORDIL) 20 MG tablet Take 20 mg by mouth 3 (three) times daily. 08/24/20  Yes [provider]  OVER THE COUNTER MEDICATION Take 2 capsules by mouth 2 (two) times daily. Zymessence   Yes [provider]  propranolol (INDERAL) 80 MG tablet Take 1 tablet (80 mg total) by mouth 2 (two) times daily. 08/09/20  Yes MFreida Busman MD  sevelamer carbonate (RENVELA) 800 MG tablet Take 800 mg by mouth 3 (three) times daily  with meals. Just takes with meals 07/25/20  Yes [provider]  blood glucose meter kit and supplies Dispense based on patient and insurance preference. Use up to four times daily as directed. (FOR ICD-10 E10.9, E11.9). 05/18/19  Mikhail, [place], DO  blood glucose meter kit and supplies Dispense based on patient and insurance preference. Use up to four times daily as directed. (FOR ICD-9 250.00, 250.01). 05/18/19   Cristal Ford, DO  Continuous Blood Gluc Sensor (FREESTYLE LIBRE 14 DAY SENSOR) MISC See admin instructions. 06/20/19   [provider]  glucose blood (ACCU-CHEK AVIVA PLUS) test strip Use as instructed 05/18/19   Cristal Ford, DO  Lancet Devices ([place]) lancets Use as instructed 05/18/19   Cristal Ford, DO  loratadine (CLARITIN) 10 MG tablet Take 10 mg by mouth daily as needed for allergies.  11/06/19  [provider]    Allergies    Amoxicillin, Hydromorphone, Percocet [oxycodone-acetaminophen], Chlorhexidine, and Gabapentin  Review of Systems   Review of Systems  HENT: Positive for congestion and postnasal drip.   Respiratory: Positive for cough.   All other systems reviewed and are negative.   Physical Exam Updated Vital Signs BP (!) 175/100   Pulse 88   Temp 97.8 F (36.6 C) (Oral)   Resp 16   SpO2 96%   Physical Exam Constitutional:      Appearance: He is well-developed.     Comments: NAD. Non toxic.   HENT:     Head: Normocephalic and atraumatic.     Nose: Nose normal.  Eyes:     General: Lids are normal.     Conjunctiva/sclera: Conjunctivae normal.  Neck:     Trachea: Trachea normal.     Comments: Trachea midline.  Cardiovascular:     Rate and Rhythm: Normal rate and regular rhythm.     Pulses:          Radial pulses are 1+ on the right side and 1+ on the left side.       Dorsalis pedis pulses are 1+ on the right side and 1+ on the left side.     Heart sounds: S1 normal and S2 normal. Murmur heard.       Comments: Trace edema pretibial, bilateral. No calf tenderness.  Pulmonary:     Effort: Pulmonary effort is normal.     Breath sounds: Normal breath sounds.  Abdominal:     General: Bowel sounds are normal.     Palpations: Abdomen is soft.     Tenderness: There is abdominal tenderness (epigatrium).     Comments: No pulsatile mass. Negative Murphy's and McBurney's. No ascites/fluid wave.   Musculoskeletal:     Cervical back: Normal range of motion.  Skin:    General: Skin is warm and dry.     Capillary Refill: Capillary refill takes less than 2 seconds.     Comments: No rash to chest wall  Neurological:     Mental Status: He is alert.     GCS: GCS eye subscore is 4. GCS verbal subscore is 5. GCS motor subscore is 6.     Comments: Sensation and strength intact in upper/lower extremities  Psychiatric:        Speech: Speech normal.        Behavior: Behavior normal.        Thought Content: Thought content normal.     ED Results / Procedures / Treatments   Labs (all labs ordered are listed, but only abnormal results are displayed) Labs Reviewed  CBC - Abnormal; Notable for the following components:      Result Value   Hemoglobin 12.4 (*)    RDW 19.2 (*)    All other components within normal limits  RESP  PANEL BY RT-PCR (FLU A&B, COVID) [id number]  BASIC METABOLIC PANEL  HEPATIC FUNCTION PANEL  LIPASE, BLOOD  TROPONIN I (HIGH SENSITIVITY)  TROPONIN I (HIGH SENSITIVITY)    EKG None  Radiology DG Chest Portable 1 View  Result Date: 09/21/2020 CLINICAL DATA:  chest pain EXAM: PORTABLE CHEST 1 VIEW COMPARISON:  08/05/2020 chest radiograph and prior. FINDINGS: Patchy bibasilar opacities. No pneumothorax or pleural effusion. Cardiomediastinal silhouette is unchanged. No acute osseous abnormality. IMPRESSION: Minimal patchy bibasilar opacities, likely atelectasis Electronically Signed   By: Primitivo Gauze M.D.   On: 09/21/2020 13:58    Procedures .Critical Care Performed by:  Kinnie Feil, PA-C Authorized by: Kinnie Feil, PA-C   Critical care provider statement:    Critical care time (minutes):  45   Critical care was necessary to treat or prevent imminent or life-threatening deterioration of the following conditions:  Cardiac failure   Critical care was time spent personally by me on the following activities:  Discussions with consultants, evaluation of patient's response to treatment, examination of patient, ordering and performing treatments and interventions, ordering and review of laboratory studies, ordering and review of radiographic studies, pulse oximetry, re-evaluation of patient's condition, obtaining history from patient or surrogate and review of old charts   (including critical care time)  Medications Ordered in ED Medications  morphine 4 MG/ML injection 4 mg (has no administration in time range)  aspirin chewable tablet 324 mg (has no administration in time range)    ED Course  I have reviewed the triage vital signs and the nursing notes.  Pertinent labs & imaging results that were available during my care of the patient were reviewed by me and considered in my medical decision making (see chart for details).  Clinical Course as of Sep 22 1455  Fri Sep 21, 2020  1402 IMPRESSION: Minimal patchy bibasilar opacities, likely atelectasis    DG Chest Portable 1 View [CG]    Clinical Course User Index [CG] Arlean Hopping   MDM Rules/Calculators/A&P                          54 year old male with pertinent PMH of severe three-vessel obstructive disease/CAD, ESRD on MWF HD presents to the ED for sudden onset central left-sided chest pain 10/10 while walking at around 10:30-11 am today.  Chest pain improved after 3 nitroglycerin.  Does report upper respiratory symptoms, unvaccinated for COVID with recent travel for Thanksgiving.  EMR, triage and nursing notes reviewed to obtain more history and assist with MDM  Recent  hospitalization for chest pain on 07/2020.  Cardiac cath on 08/07/2020 showed severe three-vessel obstructive disease, severely elevated LV filling pressures, severe pulmonary HTN.  Per cardiology notes plan was to optimize medical therapy/volume status as he has poor anatomy for revascularization/PCI.  Echo showed EF of 55 to 60%.  Recent medication changes by [facility] cardiology.   DDx includes unstable angina, MI.  He has upper respiratory infection symptoms and unvaccinated, Covid or bacterial pneumonia a possibility as well.  Given cough, upper respiratory symptoms doubt PE.  No distal neuro pulse deficits, back pain and consider dissection less likely.  Lab work, imaging ordered as above.  1500: ER work-up personally visualized and interpreted, only CBC, chest x-ray and EKG available.  No leukocytosis, minimal anemia hemoglobin 12.4.  Chest x-ray with minimal patchy bibasilar opacities question atelectasis.  Patient does have upper respiratory symptoms and unvaccinated for COVID.  EKG shows LVH  with repull abnormality, prolonged QT 534, sinus rhythm.  Transfer of care to oncoming ED PA who will follow up on remaining lab work.  Low threshold to admit given his known CAD.  Consider cardiology consult.  Addendum 1530: Trop 1700. Re-eval patient has 5/10 left sided CP, updated on POC and admission/cardiology consult. Hep/nitro gtt.  Final Clinical Impression(s) / ED Diagnoses Final diagnoses:  Chest pain with high risk for cardiac etiology    Rx / DC Orders ED Discharge Orders    None         Kinnie Feil, PA-C 09/21/20 1531    Charlesetta Shanks, MD 09/28/20 1524

## 2020-09-22 ENCOUNTER — Encounter (HOSPITAL_COMMUNITY): Payer: Self-pay | Admitting: Internal Medicine

## 2020-09-22 DIAGNOSIS — E1165 Type 2 diabetes mellitus with hyperglycemia: Secondary | ICD-10-CM

## 2020-09-22 DIAGNOSIS — N186 End stage renal disease: Secondary | ICD-10-CM | POA: Diagnosis not present

## 2020-09-22 DIAGNOSIS — I10 Essential (primary) hypertension: Secondary | ICD-10-CM | POA: Diagnosis not present

## 2020-09-22 DIAGNOSIS — I214 Non-ST elevation (NSTEMI) myocardial infarction: Secondary | ICD-10-CM | POA: Diagnosis not present

## 2020-09-22 LAB — LIPID PANEL
Cholesterol: 132 mg/dL (ref 0–200)
HDL: 69 mg/dL (ref >40)
LDL Cholesterol: 48 mg/dL (ref 0–99)
Total CHOL/HDL Ratio: 1.9 RATIO
Triglycerides: 73 mg/dL (ref <150)
VLDL: 15 mg/dL (ref 0–40)

## 2020-09-22 LAB — BASIC METABOLIC PANEL
Anion gap: 17 — ABNORMAL HIGH (ref 5–15)
BUN: 25 mg/dL — ABNORMAL HIGH (ref 6–20)
CO2: 29 mmol/L (ref 22–32)
Calcium: 9.7 mg/dL (ref 8.9–10.3)
Chloride: 93 mmol/L — ABNORMAL LOW (ref 98–111)
Creatinine, Ser: 6.72 mg/dL — ABNORMAL HIGH (ref 0.61–1.24)
GFR, Estimated: 9 mL/min — ABNORMAL LOW (ref >60)
Glucose, Bld: 135 mg/dL — ABNORMAL HIGH (ref 70–99)
Potassium: 4.6 mmol/L (ref 3.5–5.1)
Sodium: 139 mmol/L (ref 135–145)

## 2020-09-22 LAB — HEMOGLOBIN A1C
Hgb A1c MFr Bld: 7.4 % — ABNORMAL HIGH (ref 4.8–5.6)
Mean Plasma Glucose: 165.68 mg/dL

## 2020-09-22 LAB — CBC
HCT: 36.7 % — ABNORMAL LOW (ref 39.0–52.0)
Hemoglobin: 11.5 g/dL — ABNORMAL LOW (ref 13.0–17.0)
MCH: 28.2 pg (ref 26.0–34.0)
MCHC: 31.3 g/dL (ref 30.0–36.0)
MCV: 90 fL (ref 80.0–100.0)
Platelets: 184 10*3/uL (ref 150–400)
RBC: 4.08 MIL/uL — ABNORMAL LOW (ref 4.22–5.81)
RDW: 19.1 % — ABNORMAL HIGH (ref 11.5–15.5)
WBC: 5.2 10*3/uL (ref 4.0–10.5)
nRBC: 0 % (ref 0.0–0.2)

## 2020-09-22 LAB — GLUCOSE, CAPILLARY
Glucose-Capillary: 191 mg/dL — ABNORMAL HIGH (ref 70–99)
Glucose-Capillary: 196 mg/dL — ABNORMAL HIGH (ref 70–99)
Glucose-Capillary: 183 mg/dL — ABNORMAL HIGH (ref 70–99)
Glucose-Capillary: 165 mg/dL — ABNORMAL HIGH (ref 70–99)

## 2020-09-22 LAB — HEPARIN LEVEL (UNFRACTIONATED)
Heparin Unfractionated: 0.18 IU/mL — ABNORMAL LOW (ref 0.30–0.70)
Heparin Unfractionated: 0.22 IU/mL — ABNORMAL LOW (ref 0.30–0.70)

## 2020-09-22 MED ORDER — DOCUSATE SODIUM 100 MG PO CAPS
100.0000 mg | ORAL_CAPSULE | Freq: Two times a day (BID) | ORAL | Status: DC
  Administered 2020-09-22 – 2020-09-25 (×5): 100 mg via ORAL
  Filled 2020-09-22 (×9): qty 1

## 2020-09-22 MED ORDER — POLYETHYLENE GLYCOL 3350 17 G PO PACK
17.0000 g | PACK | Freq: Every day | ORAL | Status: DC
  Administered 2020-09-22: 17 g via ORAL
  Filled 2020-09-22 (×5): qty 1

## 2020-09-22 NOTE — Progress Notes (Signed)
[facility] for Heparin Indication: chest pain/ACS  Allergies  Allergen Reactions  . Amoxicillin Itching    Did it involve swelling of the face/tongue/throat, SOB, or low BP? Unknown Did it involve sudden or severe rash/hives, skin peeling, or any reaction on the inside of your mouth or nose? Unknown Did you need to seek medical attention at a hospital or doctor's office? Unknown When did it last happen?20 years ago If all above answers are "NO", may proceed with cephalosporin use.   Marland Kitchen Hydromorphone Itching    Patient states he may take with benadryl. Makes feet itch.  Marland Kitchen Percocet [Oxycodone-Acetaminophen] Itching and Other (See Comments)    Legs only. Itching and burning feeling.  . Chlorhexidine Hives    Patient has blistering  . Gabapentin Nausea And Vomiting    Stomach issues     Patient Measurements: Height: 5\' 10"  (177.8 cm) Weight: 86.6 kg (191 lb) IBW/kg (Calculated) : 73 Heparin Dosing Weight: 87 kg  Vital Signs: Temp: 98 F (36.7 C) (12/04 0455) Temp Source: Oral (12/04 0455) BP: 147/79 (12/04 0657) Pulse Rate: 62 (12/04 0455)  Labs: Recent Labs    09/21/20 1400 09/21/20 1400 09/21/20 1532 09/21/20 2000 09/21/20 2229 09/21/20 2236 09/22/20 0510  HGB 12.4*  --   --   --   --   --  11.5*  HCT 39.2  --   --   --   --   --  36.7*  PLT 202  --   --   --   --   --  184  HEPARINUNFRC  --   --   --   --   --  0.14* 0.18*  CREATININE 5.10*  --   --   --   --   --  6.72*  TROPONINIHS 1,701*   < > 5,089* 13,653* 15,203*  --   --    < > = values in this interval not displayed.    Estimated Creatinine Clearance: 13 mL/min (A) (by C-G formula based on SCr of 6.72 mg/dL (H)).   Medical History: Past Medical History:  Diagnosis Date  . Abscess of right foot   . Acute osteomyelitis of right foot ([place]) 05/15/2019  . Anemia 06/23/2019  . Cellulitis of right foot 05/22/2019  . Chest pain 08/23/2013  . Chills    at night -  sometimes  . Complication of anesthesia   . Diabetes mellitus    controlled by diet  . 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 Diabetic foot ulcer- right second toe 05/14/2019  . Diabetic infection of right foot ([place])   . Diabetic retinopathy associated with type 2 diabetes mellitus ([place]) 06/23/2019  . Eczema   . ESRD on dialysis ([place]) 07/18/2011  . Essential hypertension 10/18/2007   Qualifier: Diagnosis of  By: Loanne Drilling MD, Jacelyn Pi   . Fatigue    loss of fatigue  . FOOT ULCER, LEFT 06/29/2008   Qualifier: Diagnosis of  By: Loanne Drilling MD, Sean A   . Gangrene of toe of right foot ([place])   . Headache(784.0)    Years ago  . Heart murmur    years ago  . Hemodialysis status ([place]) 08/23/2013  . History of blood transfusion   . Hyperglycemia 05/05/2015  . HYPERLIPIDEMIA 05/16/2008   Qualifier: Diagnosis of  By: Jenny Reichmann MD, Hunt Oris   . Hypertension    sees Dr. Jaci Standard  . Hypoglycemia, unspecified 11/13/2008   Qualifier: Diagnosis of  By: Jenny Reichmann MD,  Hunt Oris   . MRSA (methicillin resistant staph aureus) culture positive   . MRSA INFECTION 10/18/2007   Qualifier: Diagnosis of  By: Marca Ancona RMA, Lucy    . Obesity, unspecified 08/23/2013  . Osteomyelitis of great toe of right foot ([place]) 05/14/2019  . Other forms of retinal detachment(361.89) 06/23/2019  . PONV (postoperative nausea and vomiting)   . Poor circulation   . Renal disorder   . RENAL INSUFFICIENCY 07/31/2008   Qualifier: Diagnosis of  By: Jenny Reichmann MD, Hunt Oris   . Secondary hyperparathyroidism ([place]) 06/23/2019  . Sepsis ([place]) 05/14/2019  . Type 2 diabetes mellitus ([place]) 08/23/2013  . Ulcer of right foot, with necrosis of bone ([place])   . Ulcer of right second toe with necrosis of bone ([facility])     Medications:  Scheduled:  . amLODipine  10 mg Oral QHS  . aspirin  324 mg Oral Once  . aspirin  325 mg Oral Daily  . atorvastatin  40 mg Oral Daily  . ezetimibe  10 mg Oral Daily  . hydrALAZINE  25 mg Oral Q8H  . insulin aspart  0-5 Units  Subcutaneous QHS  . insulin aspart  0-9 Units Subcutaneous TID WC  . propranolol  80 mg Oral BID  . sevelamer carbonate  800 mg Oral TID WC    Assessment: Patient is a 44 yom that is being admitted for chest pain. The patient has a hx of CAD and ESRD on HD (MWF). Pharmacy has been asked to dose heparin at this time for ACS. Trop significantly elevated > 1000.   Heparin level remains subtherapeutic s/p rate increase to 1250 units/hr, Chronic anemia stable, troponin uptrend.    Goal of Therapy:  Heparin level 0.3-0.7 units/ml Monitor platelets by anticoagulation protocol: Yes   Plan:  Increase heparin gtt to 1400 units/hr F/u 8 hour heparin level Daily heparin level, CBC, s/s bleeding  Bertis Ruddy, PharmD Clinical Pharmacist Please check AMION for all [place] numbers 09/22/2020 7:22 AM

## 2020-09-22 NOTE — Plan of Care (Signed)
  Problem: Education: Goal: Understanding of CV disease, CV risk reduction, and recovery process will improve Outcome: Progressing   Problem: Education: Goal: Knowledge of General Education information will improve Description: Including pain rating scale, medication(s)/side effects and non-pharmacologic comfort measures Outcome: Progressing   Problem: Coping: Goal: Level of anxiety will decrease Outcome: Progressing   Problem: Pain Managment: Goal: General experience of comfort will improve Outcome: Progressing   Problem: Safety: Goal: Ability to remain free from injury will improve Outcome: Progressing

## 2020-09-22 NOTE — Progress Notes (Signed)
Progress Note  Patient Name: [name] Date of Encounter: 09/22/2020  Primary Cardiologist: Candee Furbish, MD   Subjective   Feels well, no CP  Inpatient Medications    Scheduled Meds: . amLODipine  10 mg Oral QHS  . aspirin  324 mg Oral Once  . aspirin  325 mg Oral Daily  . atorvastatin  40 mg Oral Daily  . docusate sodium  100 mg Oral BID  . ezetimibe  10 mg Oral Daily  . hydrALAZINE  25 mg Oral Q8H  . insulin aspart  0-5 Units Subcutaneous QHS  . insulin aspart  0-9 Units Subcutaneous TID WC  . polyethylene glycol  17 g Oral Daily  . propranolol  80 mg Oral BID  . sevelamer carbonate  800 mg Oral TID WC   Continuous Infusions: . heparin 1,400 Units/hr (09/22/20 0830)  . nitroGLYCERIN 10 mcg/min (09/21/20 2025)   PRN Meds: acetaminophen, guaiFENesin-dextromethorphan, magnesium hydroxide, ondansetron (ZOFRAN) IV   Vital Signs    Vitals:   09/22/20 0455 09/22/20 0657 09/22/20 0800 09/22/20 1308  BP: (!) 150/84 (!) 147/79 109/78 138/83  Pulse: 62     Resp: 18  18   Temp: 98 F (36.7 C)  98.7 F (37.1 C)   TempSrc: Oral  Oral   SpO2: 100%     Weight: 86.6 kg     Height:        Intake/Output Summary (Last 24 hours) at 09/22/2020 1739 Last data filed at 09/22/2020 1100 Gross per 24 hour  Intake 856.83 ml  Output --  Net 856.83 ml   Filed Weights   09/21/20 1532 09/22/20 0455  Weight: 87 kg 86.6 kg    Telemetry    SR - Personally Reviewed  ECG    No new - Personally Reviewed  Physical Exam   GEN: No acute distress.   Neck: No JVD Cardiac: regular rhythm, normal rate, 2/6 SEM, no rubs, or gallops.  Respiratory: Clear to auscultation bilaterally. GI: Soft, nontender, non-distended  MS: No edema; No deformity. Neuro:  Nonfocal  Psych: Normal affect   Labs    Chemistry Recent Labs  Lab 09/21/20 1400 09/22/20 0510  NA 137 139  K 3.8 4.6  CL 93* 93*  CO2 27 29  GLUCOSE 256* 135*  BUN 19 25*  CREATININE 5.10* 6.72*  CALCIUM 9.7 9.7   PROT 7.7  --   ALBUMIN 3.6  --   AST 37  --   ALT 24  --   ALKPHOS 185*  --   BILITOT 1.4*  --   GFRNONAA 13* 9*  ANIONGAP 17* 17*     Hematology Recent Labs  Lab 09/21/20 1400 09/22/20 0510  WBC 5.8 5.2  RBC 4.39 4.08*  HGB 12.4* 11.5*  HCT 39.2 36.7*  MCV 89.3 90.0  MCH 28.2 28.2  MCHC 31.6 31.3  RDW 19.2* 19.1*  PLT 202 184    Cardiac EnzymesNo results for input(s): TROPONINI in the last 168 hours. No results for input(s): TROPIPOC in the last 168 hours.   BNPNo results for input(s): BNP, PROBNP in the last 168 hours.   DDimer No results for input(s): DDIMER in the last 168 hours.   Radiology    DG Chest Portable 1 View  Result Date: 09/21/2020 CLINICAL DATA:  chest pain EXAM: PORTABLE CHEST 1 VIEW COMPARISON:  08/05/2020 chest radiograph and prior. FINDINGS: Patchy bibasilar opacities. No pneumothorax or pleural effusion. Cardiomediastinal silhouette is unchanged. No acute osseous abnormality. IMPRESSION: Minimal  patchy bibasilar opacities, likely atelectasis Electronically Signed   By: Primitivo Gauze M.D.   On: 09/21/2020 13:58    Cardiac Studies   -  Patient Profile     54 yo male with severe 3 vessel obstructive CAD and anatomy that was not suitable for multivessel PCI due to small branch vessel disease, and severely elevated LVEDP and pulmonary hypertension. Echocardiogram performed at [facility] on 08/02/20 shows Normal biventricular function with moderate TR, aortic valve sclerosis, and RVSP 59 mmHg. He presents for chest pain that he describes as the worst he has ever felt. Troponin elevated to 1700, which is a change from prior relatively unremarkable troponins in the setting of ESRD with IHD.    Assessment & Plan   Principal Problem:   NSTEMI (non-ST elevated myocardial infarction) [facility]) Active Problems:   Essential hypertension   ESRD on dialysis [facility])   Diabetic foot ulcer- right second toe   NSTEMI  Feels well today. Discuss on Monday with IC  regarding LHC vs continued medical management given elevated troponin.  - ASA 81 mg daily (does not need 325 mg daily for CV purposes) - continue statin/zetia and BB  HTN - continue BB and amlodipine, hydralazine.      For questions or updates, please contact [place] Please consult [email or web address] for contact info under        Signed, Elouise Munroe, MD  09/22/2020, 5:39 PM

## 2020-09-22 NOTE — Progress Notes (Signed)
[facility] PROGRESS NOTE   [name] EYC:[id number] DOB: 09-12-1966 DOA: 09/21/2020  PCP: [name], MD  Brief History/Interval Summary: 54 y.o. male with a past medical history of end-stage renal disease on hemodialysis on Monday Wednesday Friday, essential hypertension, coronary artery disease, history of diabetes though no longer on medications, chronic diastolic dysfunction, peripheral vascular disease who presented to the hospital complaining of chest pain.  Patient was recently hospitalized in October for unstable angina and underwent cardiac cath at that time which showed severe three-vessel obstructive disease involving multiple branch vessels.  Medical management was pursued.  Patient mentions that he went for his usual dialysis session this morning.  Came back home and was in the bathroom when he started experiencing pain in the left side of his chest.  It was a dull aching pain becoming 9 out of 10 in intensity.  Some radiation to the left arm but not to the jaw or the neck.  Felt lightheaded.  Did not have any syncopal episode.  He felt hot and sweaty.  Was short of breath as well.  Did not have any nausea vomiting.  Subsequently decided to seek medical attention.  In the emergency department he underwent evaluation including EKG and blood work.  Troponins were noted to be significantly elevated.  Cardiology was consulted.  Patient was placed on IV heparin and IV nitroglycerin with improvement in symptoms.  Pain is currently 4 out of 10 in intensity.  He will need hospitalization for further management.   Consultants: Cardiology.  Nephrology notified today.  Procedures: None yet  Antibiotics: Anti-infectives (From admission, onward)   None      Subjective/Interval History: Patient mentions that he has chest pain about 3 out of 10 in intensity.  Feels better compared to yesterday.  No shortness of breath.     Assessment/Plan:  Non-ST elevation MI in the  setting of known coronary artery disease Patient underwent cardiac catheterization in October which showed severe triple-vessel disease.  Medical management was being pursued.  Comes back and with anginal symptoms.   Cardiology is following.  Continue aspirin statin beta-blocker.  LDL 48. Symptoms are better. Continue IV heparin and IV nitroglycerin.   Tentative plan is for another cardiac catheterization early next week.  End-stage renal disease on hemodialysis on Monday Wednesday Friday Underwent dialysis at his dialysis center yesterday.  Stable from renal standpoint.  Will inform nephrology about his hospitalization.  Patient will need to be dialyzed Monday which will need to be coordinated with cardiology if he is going to undergo cardiac catheterization.  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  Patient has not been on any medications for diabetes for a while.  He states that he used to be on insulin previously but now has been able to get his diabetes under good control with dietary modifications.  However HbA1c noted to be 7.4.  Continue SSI for now.  He will need to be started back on medications for better control of his diabetes.  Essential hypertension Home medications being continued.  Continue to monitor closely while he is on IV nitroglycerin.  History of peripheral vascular disease/diabetic foot ulcer He has a dressing over his right foot.  He is followed by podiatry in [place].  Also seen by the vascular team at [facility].  Unclear if he has had any revascularization procedures or angiogram recently. Cannot find this information on care everywhere.  Will involve wound care nurse to  assist with management of his right foot wound/ulcer.  Anemia of chronic kidney disease Hemoglobin low but stable.  No overt bleeding.   Pressure Injury 09/21/20 Foot Anterior;Right Unstageable - Full thickness tissue loss in which the base of the  injury is covered by slough (yellow, tan, gray, green or brown) and/or eschar (tan, brown or black) in the wound bed. (Active)  09/21/20 2000  Location: Foot  Location Orientation: Anterior;Right  Staging: Unstageable - Full thickness tissue loss in which the base of the injury is covered by slough (yellow, tan, gray, green or brown) and/or eschar (tan, brown or black) in the wound bed.  Wound Description (Comments):   Present on Admission: Yes      DVT Prophylaxis: IV heparin Code Status: Full code Family Communication: Discussed with the patient Disposition Plan: Hopefully return home in improved  Status is: Inpatient  Remains inpatient appropriate because:IV treatments appropriate due to intensity of illness or inability to take PO and Inpatient level of care appropriate due to severity of illness   Dispo: The patient is from: Home              Anticipated d/c is to: Home              Anticipated d/c date is: 3 days              Patient currently is not medically stable to d/c.       Medications:  Scheduled: . amLODipine  10 mg Oral QHS  . aspirin  324 mg Oral Once  . aspirin  325 mg Oral Daily  . atorvastatin  40 mg Oral Daily  . docusate sodium  100 mg Oral BID  . ezetimibe  10 mg Oral Daily  . hydrALAZINE  25 mg Oral Q8H  . insulin aspart  0-5 Units Subcutaneous QHS  . insulin aspart  0-9 Units Subcutaneous TID WC  . polyethylene glycol  17 g Oral Daily  . propranolol  80 mg Oral BID  . sevelamer carbonate  800 mg Oral TID WC   Continuous: . heparin 1,400 Units/hr (09/22/20 0830)  . nitroGLYCERIN 10 mcg/min (09/21/20 2025)   RJJ:[id number], guaiFENesin-dextromethorphan, magnesium hydroxide, ondansetron (ZOFRAN) IV   Objective:  Vital Signs  Vitals:   09/21/20 2357 09/22/20 0455 09/22/20 0657 09/22/20 0800  BP: (!) 156/83 (!) 150/84 (!) 147/79 109/78  Pulse: 85 62    Resp: 17 18  18   Temp: 98.7 F (37.1 C) 98 F (36.7 C)  98.7 F (37.1 C)   TempSrc: Oral Oral  Oral  SpO2: 100% 100%    Weight:  86.6 kg    Height:        Intake/Output Summary (Last 24 hours) at 09/22/2020 1122 Last data filed at 09/21/2020 2359 Gross per 24 hour  Intake 496.83 ml  Output --  Net 496.83 ml   Filed Weights   09/21/20 1532 09/22/20 0455  Weight: 87 kg 86.6 kg    General appearance: Awake alert.  In no distress Resp: Clear to auscultation bilaterally.  Normal effort Cardio: S1-S2 is normal regular.  No S3-S4.  No rubs murmurs or bruit GI: Abdomen is soft.  Nontender nondistended.  Bowel sounds are present normal.  No masses organomegaly Extremities: No edema.  Full range of motion of lower extremities. Neurologic: Alert and oriented x3.  No focal neurological deficits.    Lab Results:  Data Reviewed: I have personally reviewed following labs and imaging studies  CBC: Recent Labs  Lab 09/21/20 1400 09/22/20 0510  WBC 5.8 5.2  HGB 12.4* 11.5*  HCT 39.2 36.7*  MCV 89.3 90.0  PLT 202 846    Basic Metabolic Panel: Recent Labs  Lab 09/21/20 1400 09/22/20 0510  NA 137 139  K 3.8 4.6  CL 93* 93*  CO2 27 29  GLUCOSE 256* 135*  BUN 19 25*  CREATININE 5.10* 6.72*  CALCIUM 9.7 9.7    GFR: Estimated Creatinine Clearance: 13 mL/min (A) (by C-G formula based on SCr of 6.72 mg/dL (H)).  Liver Function Tests: Recent Labs  Lab 09/21/20 1400  AST 37  ALT 24  ALKPHOS 185*  BILITOT 1.4*  PROT 7.7  ALBUMIN 3.6    Recent Labs  Lab 09/21/20 1400  LIPASE 23    HbA1C: Recent Labs    09/22/20 0510  HGBA1C 7.4*    CBG: Recent Labs  Lab 09/21/20 2206 09/22/20 0807  GLUCAP 114* 191*    Lipid Profile: Recent Labs    09/22/20 0510  CHOL 132  HDL 69  LDLCALC 48  TRIG 73  CHOLHDL 1.9      Recent Results (from the past 240 hour(s))  Resp Panel by RT-PCR (Flu A&B, Covid) Nasopharyngeal Swab     Status: None   Collection Time: 09/21/20  1:37 PM   Specimen: Nasopharyngeal Swab; Nasopharyngeal(NP) swabs in  vial transport medium  Result Value Ref Range Status   SARS Coronavirus 2 by RT PCR NEGATIVE NEGATIVE Final    Comment: (NOTE) SARS-CoV-2 target nucleic acids are NOT DETECTED.  The SARS-CoV-2 RNA is generally detectable in upper respiratory specimens during the acute phase of infection. The lowest concentration of SARS-CoV-2 viral copies this assay can detect is 138 copies/mL. A negative result does not preclude SARS-Cov-2 infection and should not be used as the sole basis for treatment or other patient management decisions. A negative result may occur with  improper specimen collection/handling, submission of specimen other than nasopharyngeal swab, presence of viral mutation(s) within the areas targeted by this assay, and inadequate number of viral copies(<138 copies/mL). A negative result must be combined with clinical observations, patient history, and epidemiological information. The expected result is Negative.  Fact Sheet for Patients:  EntrepreneurPulse.com.au  Fact Sheet for Healthcare Providers:  IncredibleEmployment.be  This test is no t yet approved or cleared by the [place] FDA and  has been authorized for detection and/or diagnosis of SARS-CoV-2 by FDA under an Emergency Use Authorization (EUA). This EUA will remain  in effect (meaning this test can be used) for the duration of the COVID-19 declaration under Section 564(b)(1) of the Act, 21 U.S.C.section 360bbb-3(b)(1), unless the authorization is terminated  or revoked sooner.       Influenza A by PCR NEGATIVE NEGATIVE Final   Influenza B by PCR NEGATIVE NEGATIVE Final    Comment: (NOTE) The Xpert Xpress SARS-CoV-2/FLU/RSV plus assay is intended as an aid in the diagnosis of influenza from Nasopharyngeal swab specimens and should not be used as a sole basis for treatment. Nasal washings and aspirates are unacceptable for Xpert Xpress SARS-CoV-2/FLU/RSV testing.  Fact  Sheet for Patients: EntrepreneurPulse.com.au  Fact Sheet for Healthcare Providers: IncredibleEmployment.be  This test is not yet approved or cleared by the [place] FDA and has been authorized for detection and/or diagnosis of SARS-CoV-2 by FDA under an Emergency Use Authorization (EUA). This EUA will remain in effect (meaning this test can be used) for the duration of the COVID-19 declaration under Section 564(b)(1) of the  Act, 21 U.S.C. section 360bbb-3(b)(1), unless the authorization is terminated or revoked.  Performed at [facility], [place] [address] [place], [place] [address]       Radiology Studies: DG Chest Portable 1 View  Result Date: 09/21/2020 CLINICAL DATA:  chest pain EXAM: PORTABLE CHEST 1 VIEW COMPARISON:  08/05/2020 chest radiograph and prior. FINDINGS: Patchy bibasilar opacities. No pneumothorax or pleural effusion. Cardiomediastinal silhouette is unchanged. No acute osseous abnormality. IMPRESSION: Minimal patchy bibasilar opacities, likely atelectasis Electronically Signed   By: Primitivo Gauze M.D.   On: 09/21/2020 13:58       LOS: 1 day   [facility] Pager on [email or web address]  09/22/2020, 11:22 AM

## 2020-09-22 NOTE — Progress Notes (Signed)
ANTICOAGULATION CONSULT NOTE  Pharmacy Consult for Heparin Indication: chest pain/ACS  Patient Measurements: Height: 5\' 10"  (177.8 cm) Weight: 86.6 kg (191 lb) IBW/kg (Calculated) : 73 Heparin Dosing Weight: 87 kg  Vital Signs: Temp: 98.7 F (37.1 C) (12/04 0800) Temp Source: Oral (12/04 0800) BP: 138/83 (12/04 1308)  Labs: Recent Labs    09/21/20 1400 09/21/20 1400 09/21/20 1532 09/21/20 2000 09/21/20 2229 09/21/20 2236 09/22/20 0510  HGB 12.4*  --   --   --   --   --  11.5*  HCT 39.2  --   --   --   --   --  36.7*  PLT 202  --   --   --   --   --  184  HEPARINUNFRC  --   --   --   --   --  0.14* 0.18*  CREATININE 5.10*  --   --   --   --   --  6.72*  TROPONINIHS 1,701*   < > 5,089* 13,653* 15,203*  --   --    < > = values in this interval not displayed.    Estimated Creatinine Clearance: 13 mL/min (A) (by C-G formula based on SCr of 6.72 mg/dL (H)).  Medications:  Scheduled:  . amLODipine  10 mg Oral QHS  . aspirin  324 mg Oral Once  . aspirin  325 mg Oral Daily  . atorvastatin  40 mg Oral Daily  . docusate sodium  100 mg Oral BID  . ezetimibe  10 mg Oral Daily  . hydrALAZINE  25 mg Oral Q8H  . insulin aspart  0-5 Units Subcutaneous QHS  . insulin aspart  0-9 Units Subcutaneous TID WC  . polyethylene glycol  17 g Oral Daily  . propranolol  80 mg Oral BID  . sevelamer carbonate  800 mg Oral TID WC    Assessment: Patient is a 55 yom that is being admitted for chest pain. The patient has a hx of CAD and ESRD on HD (MWF). Pharmacy has been asked to dose heparin at this time for ACS. Trop significantly elevated > 1000.   Heparin level this evening remains SUBtherapeutic though trending up after a rate increase earlier today (HL 0.22 << 0.18, goal of 0.3-0.7). No bleeding or issues noted per discussion with RN ([place])  Goal of Therapy:  Heparin level 0.3-0.7 units/ml Monitor platelets by anticoagulation protocol: Yes   Plan:  - Increase Heparin to 1600  units/hr (16 ml/hr) - Will continue to monitor for any signs/symptoms of bleeding and will follow up with heparin level in 8 hours   Thank you for allowing pharmacy to be a part of this patient's care.  Alycia Rossetti, PharmD, BCPS Clinical Pharmacist Clinical phone for 09/22/2020: [phone] 09/22/2020 5:15 PM   **Pharmacist phone directory can now be found on [email or web address] ([email or web address]).  Listed under [place].

## 2020-09-22 NOTE — Consult Note (Incomplete)
Renal Service Consult Note [facility]  [name] 09/22/2020 [name], MD Requesting Physician: Dr Maryland Pink, Darnell Level.   Reason for Consult: ESRD pt w/  HPI: The patient is a 54 y.o. year-old w/ hx of DM2, ESRD on HD, HTN, HL, hx diabetic foot ulcers/ PAD w/ toe amputations  Pt admitted in Oct 2021 w/ chest pain, had R/L heart cath showing severe multivessel CAD w/ no targets for revascularization. Also had severe pHTN and ^filling pressures. Pt agreed to gradual lowering of dry wt in OP setting. Started on hydralazine per cardiology rec's, and coreg changed to propranolol to help w/ migraines.   OP HD showed dry wt lowered 87.5kg to 84.5kg over the last 2-3 months.   ROS  denies CP  no joint pain   no HA  no blurry vision  no rash  no diarrhea  no nausea/ vomiting  no dysuria  no difficulty voiding  no change in urine color    Past Medical History  Past Medical History:  Diagnosis Date  . Abscess of right foot   . Acute osteomyelitis of right foot ([place]) 05/15/2019  . Anemia 06/23/2019  . Cellulitis of right foot 05/22/2019  . Chest pain 08/23/2013  . Chills    at night - sometimes  . Complication of anesthesia   . Diabetes mellitus    controlled by diet  . 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 Diabetic foot ulcer- right second toe 05/14/2019  . Diabetic infection of right foot ([place])   . Diabetic retinopathy associated with type 2 diabetes mellitus ([place]) 06/23/2019  . Eczema   . ESRD on dialysis ([place]) 07/18/2011  . Essential hypertension 10/18/2007   Qualifier: Diagnosis of  By: Loanne Drilling MD, Jacelyn Pi   . Fatigue    loss of fatigue  . FOOT ULCER, LEFT 06/29/2008   Qualifier: Diagnosis of  By: Loanne Drilling MD, Sean A   . Gangrene of toe of right foot ([place])   . Headache(784.0)    Years ago  . Heart murmur    years ago  . Hemodialysis status ([place]) 08/23/2013  . History of blood transfusion   . Hyperglycemia 05/05/2015  . HYPERLIPIDEMIA  05/16/2008   Qualifier: Diagnosis of  By: Jenny Reichmann MD, Hunt Oris   . Hypertension    sees Dr. Jaci Standard  . Hypoglycemia, unspecified 11/13/2008   Qualifier: Diagnosis of  By: Jenny Reichmann MD, Hunt Oris   . MRSA (methicillin resistant staph aureus) culture positive   . MRSA INFECTION 10/18/2007   Qualifier: Diagnosis of  By: Marca Ancona RMA, Lucy    . Obesity, unspecified 08/23/2013  . Osteomyelitis of great toe of right foot ([place]) 05/14/2019  . Other forms of retinal detachment(361.89) 06/23/2019  . PONV (postoperative nausea and vomiting)   . Poor circulation   . Renal disorder   . RENAL INSUFFICIENCY 07/31/2008   Qualifier: Diagnosis of  By: Jenny Reichmann MD, Hunt Oris   . Secondary hyperparathyroidism ([place]) 06/23/2019  . Sepsis ([place]) 05/14/2019  . Type 2 diabetes mellitus ([place]) 08/23/2013  . Ulcer of right foot, with necrosis of bone ([place])   . Ulcer of right second toe with necrosis of bone [facility])    Past Surgical History  Past Surgical History:  Procedure Laterality Date  . ABDOMINAL AORTOGRAM N/A 05/17/2019   Procedure: ABDOMINAL AORTOGRAM;  Surgeon: Elam Dutch, MD;  Location: [place] CV LAB;  Service: Cardiovascular;  Laterality: N/A;  . AMPUTATION TOE Right 05/14/2019   Procedure: PARTIAL AMPUTATION  SECOND TOE RIGHT FOOT;  Surgeon: Evelina Bucy, DPM;  Location: [place];  Service: Podiatry;  Laterality: Right;  . AMPUTATION TOE Right 05/23/2019   Procedure: AMPUTATION OF SECOND TOE METATARSAL PHALANGEAL JOINT;  Surgeon: Evelina Bucy, DPM;  Location: [place];  Service: Podiatry;  Laterality: Right;  . AMPUTATION TOE Right 05/27/2019   Procedure: Amputation Toe;  Surgeon: Evelina Bucy, DPM;  Location: [place];  Service: Podiatry;  Laterality: Right;  right third toe  . APPLICATION OF WOUND VAC Right 05/27/2019   Procedure: Application Of Wound Vac;  Surgeon: Evelina Bucy, DPM;  Location: [place];  Service: Podiatry;  Laterality: Right;  . AV FISTULA PLACEMENT  06-18-11   Right brachiocephalic AVF  . BONE  BIOPSY Right 05/14/2019   Procedure: OPEN SUPERFICIAL BONE BIOPSY GREAT TOE;  Surgeon: Evelina Bucy, DPM;  Location: [place];  Service: Podiatry;  Laterality: Right;  . EYE SURGERY     left eye for Laser, diabetic retinopathy  . FISTULOGRAM Right 11/06/2011   Procedure: FISTULOGRAM;  Surgeon: Conrad [place], MD;  Location[facility];  Service: Cardiovascular;  Laterality: Right;  . HEMATOMA EVACUATION Right Feb. 25, 2014  . HEMATOMA EVACUATION Right 12/14/2012   Procedure: EVACUATION HEMATOMA;  Surgeon: Conrad Musc[place], MD;  Location: [place];  Service: Vascular;  Laterality: Right;  . INCISION AND DRAINAGE Right 05/23/2019   Procedure: INCISION AND DRAINAGE OF RIGHT FOOT DEEP SPACE ABSCESS;  Surgeon: Evelina Bucy, DPM;  Location: [place];  Service: Podiatry;  Laterality: Right;  . INSERTION OF DIALYSIS CATHETER  12/14/2012   Procedure: INSERTION OF DIALYSIS CATHETER;  Surgeon: Conrad [place], MD;  Location: [place];  Service: Vascular;;  . IRRIGATION AND DEBRIDEMENT FOOT Right 05/25/2019   Procedure: INCISION AND DRAINAGE FOOT;  Surgeon: Evelina Bucy, DPM;  Location: [place];  Service: Podiatry;  Laterality: Right;  . IRRIGATION AND DEBRIDEMENT FOOT Right 05/27/2019   Procedure: IRRIGATION AND DEBRIDEMENT FOOT;  Surgeon: Evelina Bucy, DPM;  Location: [place];  Service: Podiatry;  Laterality: Right;  . LOWER EXTREMITY ANGIOGRAPHY Bilateral 05/17/2019   Procedure: LOWER EXTREMITY ANGIOGRAPHY;  Surgeon: Elam Dutch, MD;  Location: [place] CV LAB;  Service: Cardiovascular;  Laterality: Bilateral;  . REVISON OF ARTERIOVENOUS FISTULA Right 12/14/2012   Procedure: REVISON OF upper arm ARTERIOVENOUS FISTULA using 62mx10cm gortex graft;  Surgeon: BConrad [place] MD;  [place]  Service: Vascular;  Laterality: Right;  . RIGHT/LEFT HEART CATH AND CORONARY ANGIOGRAPHY N/A 08/07/2020   Procedure: RIGHT/LEFT HEART CATH AND CORONARY ANGIOGRAPHY;  Surgeon: JMartinique Peter M, MD;  [place];   Service: Cardiovascular;  Laterality: N/A;  . SOFT TISSUE MASS EXCISION     Right arm, Left leg  for MRSA infection   Family History  Family History  Problem Relation Age of Onset  . Diabetes Mother    Social History  reports that he has never smoked. He has never used smokeless tobacco. He reports that he does not drink alcohol and does not use drugs. Allergies  Allergies  Allergen Reactions  . Amoxicillin Itching    Did it involve swelling of the face/tongue/throat, SOB, or low BP? Unknown Did it involve sudden or severe rash/hives, skin peeling, or any reaction on the inside of your mouth or nose? Unknown Did you need to seek medical attention at a hospital or doctor's office? Unknown When did it last happen?20 years ago If all above  answers are "NO", may proceed with cephalosporin use.   Marland Kitchen Hydromorphone Itching    Patient states he may take with benadryl. Makes feet itch.  Marland Kitchen Percocet [Oxycodone-Acetaminophen] Itching and Other (See Comments)    Legs only. Itching and burning feeling.  . Chlorhexidine Hives    Patient has blistering  . Gabapentin Nausea And Vomiting    Stomach issues    Home medications Prior to Admission medications   Medication Sig Start Date End Date Taking? Authorizing Provider  amLODipine (NORVASC) 10 MG tablet Take 10 mg by mouth at bedtime.  08/03/20  Yes [provider]  aspirin EC 81 MG tablet Take 1 tablet (81 mg total) by mouth daily. 06/28/19  Yes Wellington Hampshire, MD  atorvastatin (LIPITOR) 40 MG tablet Take 1 tablet (40 mg total) by mouth daily. 06/28/19 09/21/20 Yes Wellington Hampshire, MD  ezetimibe (ZETIA) 10 MG tablet Take 10 mg by mouth daily. 08/23/20  Yes [provider]  hydrALAZINE (APRESOLINE) 25 MG tablet Take 1 tablet (25 mg total) by mouth every 8 (eight) hours. Do not take morning of dialysis. Patient taking differently: Take 50 mg by mouth every 8 (eight) hours. Do not take morning of dialysis. 08/09/20  Yes  Freida Busman, MD  isosorbide dinitrate (ISORDIL) 20 MG tablet Take 20 mg by mouth 3 (three) times daily. 08/24/20  Yes [provider]  OVER THE COUNTER MEDICATION Take 2 capsules by mouth 2 (two) times daily. Zymessence   Yes [provider]  propranolol (INDERAL) 80 MG tablet Take 1 tablet (80 mg total) by mouth 2 (two) times daily. 08/09/20  Yes Freida Busman, MD  sevelamer carbonate (RENVELA) 800 MG tablet Take 800 mg by mouth 3 (three) times daily with meals. Just takes with meals 07/25/20  Yes [provider]  blood glucose meter kit and supplies Dispense based on patient and insurance preference. Use up to four times daily as directed. (FOR ICD-10 E10.9, E11.9). 05/18/19   Mikhail, Velta Addison, DO  blood glucose meter kit and supplies Dispense based on patient and insurance preference. Use up to four times daily as directed. (FOR ICD-9 250.00, 250.01). 05/18/19   Cristal Ford, DO  Continuous Blood Gluc Sensor (FREESTYLE LIBRE 14 DAY SENSOR) MISC See admin instructions. 06/20/19   [provider]  glucose blood (ACCU-CHEK AVIVA PLUS) test strip Use as instructed 05/18/19   Cristal Ford, DO  Lancet Devices ([place]) lancets Use as instructed 05/18/19   Cristal Ford, DO  loratadine (CLARITIN) 10 MG tablet Take 10 mg by mouth daily as needed for allergies.  11/06/19  [provider]     Vitals:   09/22/20 0455 09/22/20 0657 09/22/20 0800 09/22/20 1308  BP: (!) 150/84 (!) 147/79 109/78 138/83  Pulse: 62     Resp: 18  18   Temp: 98 F (36.7 C)  98.7 F (37.1 C)   TempSrc: Oral  Oral   SpO2: 100%     Weight: 86.6 kg     Height:       Exam Gen *** No rash, cyanosis or gangrene Sclera anicteric, throat clear ***  No jvd or bruits *** Chest clear bilat *** RRR no MRG *** Abd soft ntnd no mass or ascites +bs *** GU normal male *** defer *** MS no joint effusions or deformity *** Ext *** edema, no wounds or ulcers *** Neuro  is alert, Ox 3 , nf ***    Home meds:  - norvasc 10/  hydralazine 50 tid/ inderal 80 bid  - asa 81/ lipitor 40/ isordil 20 tid/ zetia 10  - renvela 800 tid ac  - prn's/ vitamins/ supplements     CXR 12/3- patchy bilat basilar opacities, likely atx, no other acute findings     K 4.6  BUN 25  Cr 6.72   CO2 29  Na 139  WBC 5K Hb 11.5     Trop 1701 > 5089 > 13653 > 15203   [facility]  4h  450/800   2/2.25 bath  84.5kg  RUA AVG   Hep 2000  - mircera 75 > 100 ug q2 wks, last 11/29  - venofer 164m x 10 q HD, 9 of 10 completed  - hect 6ug tiw   Assessment/ Plan: 1. ***      RKelly Splinter MD 09/22/2020, 5:44 PM  Recent Labs  Lab 09/21/20 1400 09/22/20 0510  WBC 5.8 5.2  HGB 12.4* 11.5*   Recent Labs  Lab 09/21/20 1400 09/22/20 0510  K 3.8 4.6  BUN 19 25*  CREATININE 5.10* 6.72*  CALCIUM 9.7 9.7

## 2020-09-23 DIAGNOSIS — I214 Non-ST elevation (NSTEMI) myocardial infarction: Secondary | ICD-10-CM | POA: Diagnosis not present

## 2020-09-23 LAB — CBC
HCT: 34.5 % — ABNORMAL LOW (ref 39.0–52.0)
Hemoglobin: 11.2 g/dL — ABNORMAL LOW (ref 13.0–17.0)
MCH: 28.8 pg (ref 26.0–34.0)
MCHC: 32.5 g/dL (ref 30.0–36.0)
MCV: 88.7 fL (ref 80.0–100.0)
Platelets: 188 10*3/uL (ref 150–400)
RBC: 3.89 MIL/uL — ABNORMAL LOW (ref 4.22–5.81)
RDW: 18.9 % — ABNORMAL HIGH (ref 11.5–15.5)
WBC: 4.3 10*3/uL (ref 4.0–10.5)
nRBC: 0 % (ref 0.0–0.2)

## 2020-09-23 LAB — GLUCOSE, CAPILLARY
Glucose-Capillary: 132 mg/dL — ABNORMAL HIGH (ref 70–99)
Glucose-Capillary: 175 mg/dL — ABNORMAL HIGH (ref 70–99)
Glucose-Capillary: 112 mg/dL — ABNORMAL HIGH (ref 70–99)
Glucose-Capillary: 234 mg/dL — ABNORMAL HIGH (ref 70–99)

## 2020-09-23 LAB — HEPARIN LEVEL (UNFRACTIONATED)
Heparin Unfractionated: 0.35 IU/mL (ref 0.30–0.70)
Heparin Unfractionated: 0.41 IU/mL (ref 0.30–0.70)

## 2020-09-23 LAB — MRSA PCR SCREENING: MRSA by PCR: NEGATIVE

## 2020-09-23 MED ORDER — FERRIC CITRATE 1 GM 210 MG(FE) PO TABS
420.0000 mg | ORAL_TABLET | Freq: Three times a day (TID) | ORAL | Status: DC
  Administered 2020-09-23 – 2020-09-24 (×3): 420 mg via ORAL
  Administered 2020-09-25: 210 mg via ORAL
  Filled 2020-09-23 (×12): qty 2

## 2020-09-23 MED ORDER — DOXERCALCIFEROL 4 MCG/2ML IV SOLN
4.0000 ug | INTRAVENOUS | Status: DC
  Filled 2020-09-23 (×2): qty 2

## 2020-09-23 MED ORDER — SEVELAMER CARBONATE 800 MG PO TABS
800.0000 mg | ORAL_TABLET | Freq: Three times a day (TID) | ORAL | Status: DC
  Administered 2020-09-24 – 2020-09-26 (×4): 800 mg via ORAL
  Filled 2020-09-23 (×7): qty 1

## 2020-09-23 MED ORDER — LANTHANUM CARBONATE 500 MG PO CHEW
1000.0000 mg | CHEWABLE_TABLET | Freq: Three times a day (TID) | ORAL | Status: DC
  Filled 2020-09-23: qty 2

## 2020-09-23 MED ORDER — GUAIFENESIN ER 600 MG PO TB12
1200.0000 mg | ORAL_TABLET | Freq: Two times a day (BID) | ORAL | Status: DC
  Administered 2020-09-23 – 2020-09-25 (×5): 1200 mg via ORAL
  Filled 2020-09-23 (×7): qty 2

## 2020-09-23 NOTE — Progress Notes (Signed)
PROGRESS NOTE  [name] VOZ:[id number] DOB: July 23, 1966 DOA: 09/21/2020 PCP: [name], MD  Brief History   54 y.o.malewith a past medical history of end-stage renal disease on hemodialysis on Monday Wednesday Friday, essential hypertension, coronary artery disease, history of diabetes though no longer on medications, chronic diastolic dysfunction, peripheral vascular disease who presented to the hospital complaining of chest pain. Patient was recently hospitalized in October for unstable angina and underwent cardiac cath at that time which showed severe three-vessel obstructive disease involving multiple branch vessels. Medical management was pursued.  Patient mentions that he went for his usual dialysis session this morning. Came back home andwasinthe bathroom when he started experiencing pain in the left side of his chest. It was a dull aching pain becoming 9 out of 10 in intensity. Some radiation to the left arm but not to the jaw or the neck. Felt lightheaded. Did not have any syncopal episode. He felt hot and sweaty. Was short of breath as well. Did not have any nausea vomiting. Subsequently decided to seek medical attention.  In the emergency department he underwent evaluation including EKG and blood work. Troponins were noted to be significantly elevated. Cardiology was consulted. Patient was placed on IV heparin and IV nitroglycerin with improvement in symptoms. Pain is currently 4 out of 10 in intensity. He will need hospitalization for further management.  The patient has been admitted to a telemetry bed. Nephrology has been consulted. They have evaluated the patient and determined that he had no urgent need for HD.   Cardiology has been consulted. They are evaluating the patient and are weighing LHC vs medical management after communication with the patient's cardiologist at [facility]. He is on a heparin drip.  Consultants   . Cardiology . Nephrology  Procedures  . None  Antibiotics   Anti-infectives (From admission, onward)   None    .  Subjective  The patient is resting comfortably. No further chest pain.  Objective   Vitals:  Vitals:   09/23/20 0942 09/23/20 1132  BP: 133/79 127/71  Pulse: 65 66  Resp:    Temp:  98 F (36.7 C)  SpO2:     Exam:  Constitutional:  . The patient is awake, alert, and oriented x 3. No acute distress. Respiratory:  . No increased work of breathing. . No wheezes, rales, or rhonchi . No tactile fremitus Cardiovascular:  . Regular rate and rhythm . No murmurs, ectopy, or gallups. . No lateral PMI. No thrills. Abdomen:  . Abdomen is soft, non-tender, non-distended . No hernias, masses, or organomegaly . Normoactive bowel sounds.  Musculoskeletal:  . No cyanosis, clubbing, or edema Skin:  . No rashes, lesions, ulcers . palpation of skin: no induration or nodules Neurologic:  . CN 2-12 intact . Sensation all 4 extremities intact Psychiatric:  . Mental status o Mood, affect appropriate o Orientation to person, place, time  . judgment and insight appear intact  I have personally reviewed the following:   Today's Data  . Vitals, Glucoses, BMP  Cardiology Data  . EKG  Scheduled Meds: . amLODipine  10 mg Oral QHS  . aspirin  324 mg Oral Once  . aspirin  325 mg Oral Daily  . atorvastatin  40 mg Oral Daily  . docusate sodium  100 mg Oral BID  . [START ON 09/24/2020] doxercalciferol  4 mcg Intravenous Q M,W,F-HD  . ezetimibe  10 mg Oral Daily  . ferric citrate  420 mg Oral TID WC  .  guaiFENesin  1,200 mg Oral BID  . hydrALAZINE  25 mg Oral Q8H  . insulin aspart  0-5 Units Subcutaneous QHS  . insulin aspart  0-9 Units Subcutaneous TID WC  . polyethylene glycol  17 g Oral Daily  . propranolol  80 mg Oral BID  . sevelamer carbonate  800 mg Oral TID WC   Continuous Infusions: . heparin 1,600 Units/hr (09/23/20 1610)  . nitroGLYCERIN 10  mcg/min (09/21/20 2025)    Principal Problem:   NSTEMI (non-ST elevated myocardial infarction) [facility]) Active Problems:   Essential hypertension   ESRD on dialysis [facility])   Diabetic foot ulcer- right second toe   LOS: 2 days   A & P  Non-ST elevation MI in the setting of known coronary artery disease: Patient underwent cardiac catheterization in October which showed severe triple-vessel disease. Medical management was being pursued. Comes back and with anginal symptoms. Cardiology is following, and the patient has been placed on a heparin drip and a nitro drip. Continue aspirin statin beta-blocker.  LDL 48. He has had no further chest pain today. Cardiology is evaluating the patient and are weighing LHC vs medical management after communication with the patient's cardiologist at [facility].   End-stage renal disease on hemodialysis on Monday Wednesday Friday: Underwent dialysis at his dialysis center on 09/21/2020. Nephrology has been consulted. Patient will need to be dialyzed Monday which will need to be coordinated with cardiology if he is going to undergo cardiac catheterization.  Diabetes mellitus type 2 with renal complications with hyperglycemia: CBGs noted to be elevated.  Patient has not been on any medications for diabetes for a while.  He states that he used to be on insulin previously but now has been able to get his diabetes under good control with dietary modifications.  However HbA1c noted to be 7.4.  Continue SSI for now.  He will need to be started back on medications for better control of his diabetes.  Essential hypertension: Home medications being continued.  Continue to monitor closely while he is on IV nitroglycerin.  History of peripheral vascular disease/diabetic foot ulcer:  He has a dressing over his right foot. He is followed by podiatry in [place]. Also seen by the vascular team at [facility]. Unclear if he has had any revascularization procedures or angiogram recently. Cannot  find this information on care everywhere. Will involve wound care nurse to assist with management of his right foot wound/ulcer.  Anemia of chronic kidney disease:  Hemoglobin low but stable.  No overt bleeding.   Pressure Injury 09/21/20 Foot Anterior;Right Unstageable - Full thickness tissue loss in which the base of the injury is covered by slough (yellow, tan, gray, green or brown) and/or eschar (tan, brown or black) in the wound bed. (Active)  09/21/20 2000  Location: Foot  Location Orientation: Anterior;Right  Staging: Unstageable - Full thickness tissue loss in which the base of the injury is covered by slough (yellow, tan, gray, green or brown) and/or eschar (tan, brown or black) in the wound bed.  Wound Description (Comments):   Present on Admission: Yes   I have seen and examined this patient myself. I have spent 34 minutes in his evaluation and car.  DVT Prophylaxis: IV heparin Code Status: Full code Family Communication: Discussed with the patient Disposition Plan: Hopefully return home in improved  Status is: Inpatient  Remains inpatient appropriate because:IV treatments appropriate due to intensity of illness or inability to take PO and Inpatient level of care appropriate due to  severity of illness   Dispo: The patient is from: Home  Anticipated d/c is to: Home  Anticipated d/c date is: 3 days  Patient currently is not medically stable to d/c.  Katrin Grabel, DO [facility] Direct contact: see [email or web address]  7PM-7AM contact night coverage as above 09/23/2020, 6:12 PM  LOS: 2 days

## 2020-09-23 NOTE — Progress Notes (Signed)
Progress Note  Patient Name: [name] Date of Encounter: 09/23/2020  Primary Cardiologist: Candee Furbish, MD   Subjective   No CP. Concerned about bleeding on heparin, we discussed this.  Inpatient Medications    Scheduled Meds: . amLODipine  10 mg Oral QHS  . aspirin  324 mg Oral Once  . aspirin  325 mg Oral Daily  . atorvastatin  40 mg Oral Daily  . docusate sodium  100 mg Oral BID  . [START ON 09/24/2020] doxercalciferol  4 mcg Intravenous Q M,W,F-HD  . ezetimibe  10 mg Oral Daily  . ferric citrate  420 mg Oral TID WC  . hydrALAZINE  25 mg Oral Q8H  . insulin aspart  0-5 Units Subcutaneous QHS  . insulin aspart  0-9 Units Subcutaneous TID WC  . polyethylene glycol  17 g Oral Daily  . propranolol  80 mg Oral BID  . sevelamer carbonate  800 mg Oral TID WC   Continuous Infusions: . heparin 1,600 Units/hr (09/23/20 0104)  . nitroGLYCERIN 10 mcg/min (09/21/20 2025)   PRN Meds: acetaminophen, guaiFENesin-dextromethorphan, magnesium hydroxide, ondansetron (ZOFRAN) IV   Vital Signs    Vitals:   09/22/20 2259 09/23/20 0459 09/23/20 0741 09/23/20 0942  BP: (!) 156/80 (!) 146/78 130/72 133/79  Pulse: 63 61  65  Resp:  18    Temp:  98.2 F (36.8 C)    TempSrc:  Oral    SpO2:  93%    Weight:      Height:        Intake/Output Summary (Last 24 hours) at 09/23/2020 1127 Last data filed at 09/22/2020 2359 Gross per 24 hour  Intake 762.55 ml  Output -  Net 762.55 ml   Filed Weights   09/21/20 1532 09/22/20 0455  Weight: 87 kg 86.6 kg    Telemetry    SR - Personally Reviewed  ECG    No new - Personally Reviewed  Physical Exam   GEN: No acute distress.   Neck: No JVD Cardiac: RRR, 2/6 SM Respiratory: Clear to auscultation bilaterally. GI: Soft, nontender, non-distended  MS: No edema Neuro:  Nonfocal  Psych: Normal affect   Labs    Chemistry Recent Labs  Lab 09/21/20 1400 09/22/20 0510  NA 137 139  K 3.8 4.6  CL 93* 93*  CO2 27 29  GLUCOSE  256* 135*  BUN 19 25*  CREATININE 5.10* 6.72*  CALCIUM 9.7 9.7  PROT 7.7  --   ALBUMIN 3.6  --   AST 37  --   ALT 24  --   ALKPHOS 185*  --   BILITOT 1.4*  --   GFRNONAA 13* 9*  ANIONGAP 17* 17*     Hematology Recent Labs  Lab 09/21/20 1400 09/22/20 0510 09/23/20 0332  WBC 5.8 5.2 4.3  RBC 4.39 4.08* 3.89*  HGB 12.4* 11.5* 11.2*  HCT 39.2 36.7* 34.5*  MCV 89.3 90.0 88.7  MCH 28.2 28.2 28.8  MCHC 31.6 31.3 32.5  RDW 19.2* 19.1* 18.9*  PLT 202 184 188    Cardiac EnzymesNo results for input(s): TROPONINI in the last 168 hours. No results for input(s): TROPIPOC in the last 168 hours.   BNPNo results for input(s): BNP, PROBNP in the last 168 hours.   DDimer No results for input(s): DDIMER in the last 168 hours.   Radiology    DG Chest Portable 1 View  Result Date: 09/21/2020 CLINICAL DATA:  chest pain EXAM: PORTABLE CHEST 1 VIEW COMPARISON:  08/05/2020 chest radiograph and prior. FINDINGS: Patchy bibasilar opacities. No pneumothorax or pleural effusion. Cardiomediastinal silhouette is unchanged. No acute osseous abnormality. IMPRESSION: Minimal patchy bibasilar opacities, likely atelectasis Electronically Signed   By: Primitivo Gauze M.D.   On: 09/21/2020 13:58    Cardiac Studies   -  Patient Profile     54 yo male with severe 3 vessel obstructive CAD and anatomy that was not suitable for multivessel PCI due to small branch vessel disease, and severely elevated LVEDP and pulmonary hypertension. Echocardiogram performed at [facility] on 08/02/20 shows Normal biventricular function with moderate TR, aortic valve sclerosis, and RVSP 59 mmHg. He presents for chest pain that he describes as the worst he has ever felt. Troponin elevated to 1700, which is a change from prior relatively unremarkable troponins in the setting of ESRD with IHD.    Assessment & Plan   Principal Problem:   NSTEMI (non-ST elevated myocardial infarction) [facility]) Active Problems:   Essential  hypertension   ESRD on dialysis [facility])   Diabetic foot ulcer- right second toe   NSTEMI  - elevated troponin compared to prior. Discuss on Monday with IC regarding LHC vs continued medical management given elevated troponin.  - will make NPO at midnight - ASA 81 mg daily (does not need 325 mg daily for CV purposes) - continue statin/zetia and BB - EF reported normal at [facility] in Oct - continue IV heparin, patient is concerned about bleeding from femoral site, we discussed indication for heparin in setting of current findings.  - he would like communication with his cardiologist at [facility], this would likely be best accomplished by IC MD who can review cath films and make further decisions, anticipate this for weekday team.   HTN - continue BB and amlodipine, hydralazine.    For questions or updates, please contact [place] Please consult [email or web address] for contact info under        Signed, Elouise Munroe, MD  09/23/2020, 11:27 AM

## 2020-09-23 NOTE — Plan of Care (Signed)
  Problem: Education: Goal: Understanding of CV disease, CV risk reduction, and recovery process will improve Outcome: Progressing   Problem: Activity: Goal: Ability to return to baseline activity level will improve Outcome: Progressing   Problem: Education: Goal: Knowledge of General Education information will improve Description: Including pain rating scale, medication(s)/side effects and non-pharmacologic comfort measures Outcome: Progressing   Problem: Clinical Measurements: Goal: Respiratory complications will improve Outcome: Not Applicable Goal: Cardiovascular complication will be avoided Outcome: Progressing   Problem: Nutrition: Goal: Adequate nutrition will be maintained Outcome: Progressing   Problem: Elimination: Goal: Will not experience complications related to bowel motility Outcome: Progressing Goal: Will not experience complications related to urinary retention Outcome: Progressing   Problem: Pain Managment: Goal: General experience of comfort will improve Outcome: Progressing   Problem: Safety: Goal: Ability to remain free from injury will improve Outcome: Progressing

## 2020-09-23 NOTE — Progress Notes (Addendum)
ANTICOAGULATION CONSULT NOTE  Pharmacy Consult for Heparin Indication: chest pain/ACS  Patient Measurements: Height: 5\' 10"  (177.8 cm) Weight: 86.6 kg (191 lb) IBW/kg (Calculated) : 73 Heparin Dosing Weight: 87 kg  Vital Signs: Temp: 98.2 F (36.8 C) (12/05 0459) Temp Source: Oral (12/05 0459) BP: 146/78 (12/05 0459) Pulse Rate: 61 (12/05 0459)  Labs: Recent Labs     0000 09/21/20 1400 09/21/20 1400 09/21/20 1532 09/21/20 2000 09/21/20 2229 09/21/20 2236 09/22/20 0510 09/22/20 1716 09/23/20 0332  HGB   < > 12.4*  --   --   --   --   --  11.5*  --  11.2*  HCT  --  39.2  --   --   --   --   --  36.7*  --  34.5*  PLT  --  202  --   --   --   --   --  184  --  188  HEPARINUNFRC  --   --   --   --   --   --    < > 0.18* 0.22* 0.35  CREATININE  --  5.10*  --   --   --   --   --  6.72*  --   --   TROPONINIHS  --  1,701*   < > 5,089* 13,653* 15,203*  --   --   --   --    < > = values in this interval not displayed.    Estimated Creatinine Clearance: 13 mL/min (A) (by C-G formula based on SCr of 6.72 mg/dL (H)).  Medications:  Scheduled:  . amLODipine  10 mg Oral QHS  . aspirin  324 mg Oral Once  . aspirin  325 mg Oral Daily  . atorvastatin  40 mg Oral Daily  . docusate sodium  100 mg Oral BID  . ezetimibe  10 mg Oral Daily  . hydrALAZINE  25 mg Oral Q8H  . insulin aspart  0-5 Units Subcutaneous QHS  . insulin aspart  0-9 Units Subcutaneous TID WC  . polyethylene glycol  17 g Oral Daily  . propranolol  80 mg Oral BID  . sevelamer carbonate  800 mg Oral TID WC    Assessment: Patient is a 25 yom that is being admitted for chest pain. The patient has a hx of CAD and ESRD on HD (MWF). Pharmacy has been asked to dose heparin at this time for ACS. Trop significantly elevated > 1000.   Heparin level therapeutic s/p rate increase to 1600 units/hr  Goal of Therapy:  Heparin level 0.3-0.7 units/ml Monitor platelets by anticoagulation protocol: Yes   Plan:  Continue  heparin gtt at 1600 units/hr F/u 8 hour heparin level to confirm F/u cards plan on Monday (LHC vs medical management)  Addendum: Confirmatory heparin level remains therapeutic Continue heparin gtt at 1600 units/hr Daily heparin level, CBC, s/s bleeding and cards plan as above   Bertis Ruddy, PharmD Clinical Pharmacist Please check AMION for all [place] numbers 09/23/2020 7:39 AM

## 2020-09-23 NOTE — Progress Notes (Signed)
I went in to give the [place] and Renvela and pt asked for fosrenol. I told him that was d/c'd this morning. Pt had told me that he would not take the chewable tabs because that did not work well with him, that he uses powder at home.  I had let her know this morning that he said he would not take the fosrenol, so she dc'd it.    Then he told me he was only going to take the [place]. He said he chooses 1 of the 3 to take, that he dose not take them all, even though he told me this morning that he took them all at home. I notified Anice Paganini of his updated info.

## 2020-09-23 NOTE — Consult Note (Signed)
[facility] Renal Consultation Note    Indication for Consultation:  Management of ESRD/hemodialysis, anemia, hypertension/volume, and secondary hyperparathyroidism.  HPI: [name] is a 54 y.o. male with PMH including ESRD on dialysis MWF, T2DM, HTN, PVD, CAD, osteomyelitis, and HLD who presented to the ED on 09/21/20 with chest pain. Patient was seen by myself at outpatient HD 12/2. At that time, he was completing a make up treatment and was moderately volume overloaded. He had HD again Friday and left 1.2kg above his EDW. Reports he was constipated but otherwise felt fine during HD. When he got home and was straining to use the bathroom, he began to have chest pain, dizziness, and diaphoresis and came to the ED. Pt recently underwent cardiac cath in October which showed severe three-vessel disease, medical management recommended. Also had severe pHTN and ^filling pressures. Pt agreed to gradual lowering of dry wt in OP setting. Started on hydralazine per cardiology rec's, and coreg changed to propranolol to help w/ migraines.  In the ED, troponins were elevated, Cardiology was consulted and started IV heparin and nitroglycerin with tenative plan for cardiac cath early this week. Labs otherwise notable for K+ 4.6, BUN 25, CR 6.72, WBC 4.3, Hgb 11.2, Plt 188. Slightly hypertensive, VS otherwise stable.  At present, patient reports he is feeling well. He denies SOB, CP, palpitations, dizziness, abdominal pain, nausea, vomiting or diarrhea. He is concerned about bleeding post HD while on heparin drip. Otherwise no concerns at this time.     Past Medical History:  Diagnosis Date  . Abscess of right foot   . Acute osteomyelitis of right foot ([place]) 05/15/2019  . Anemia 06/23/2019  . Cellulitis of right foot 05/22/2019  . Chest pain 08/23/2013  . Chills    at night - sometimes  . Complication of anesthesia   . Diabetes mellitus    controlled by diet  . Diabetes with renal  manifestations(250.4) 08/23/2013  . Diabetic foot ulcer- right second toe 05/14/2019  . Diabetic infection of right foot ([place])   . Diabetic retinopathy associated with type 2 diabetes mellitus ([place]) 06/23/2019  . Eczema   . ESRD on hemodialysis ([place]) 07/18/2011  . Essential hypertension 10/18/2007   Qualifier: Diagnosis of  By: Loanne Drilling MD, Jacelyn Pi   . Fatigue    loss of fatigue  . FOOT ULCER, LEFT 06/29/2008   Qualifier: Diagnosis of  By: Loanne Drilling MD, Sean A   . Gangrene of toe of right foot ([place])   . Headache(784.0)    Years ago  . Heart murmur    years ago  . History of blood transfusion   . Hyperglycemia 05/05/2015  . HYPERLIPIDEMIA 05/16/2008   Qualifier: Diagnosis of  By: Jenny Reichmann MD, Hunt Oris   . Hypoglycemia, unspecified 11/13/2008   Qualifier: Diagnosis of  By: Jenny Reichmann MD, Hunt Oris   . MRSA (methicillin resistant staph aureus) culture positive   . MRSA INFECTION 10/18/2007   Qualifier: Diagnosis of  By: Marca Ancona RMA, Lucy    . Obesity, unspecified 08/23/2013  . Osteomyelitis of great toe of right foot ([place]) 05/14/2019  . Other forms of retinal detachment(361.89) 06/23/2019  . PONV (postoperative nausea and vomiting)   . Poor circulation   . Secondary hyperparathyroidism ([place]) 06/23/2019  . Sepsis ([facility]) 05/14/2019  . Type 2 diabetes mellitus ([place]) 08/23/2013  . Ulcer of right foot, with necrosis of bone ([place])   . Ulcer of right second toe with necrosis of bone ([facility])    Past  Surgical History:  Procedure Laterality Date  . ABDOMINAL AORTOGRAM N/A 05/17/2019   Procedure: ABDOMINAL AORTOGRAM;  Surgeon: Elam Dutch, MD;  Location: [facility];  Service: Cardiovascular;  Laterality: N/A;  . AMPUTATION TOE Right 05/14/2019   Procedure: PARTIAL AMPUTATION SECOND TOE RIGHT FOOT;  Surgeon: Evelina Bucy, DPM;  Location: [facility];  Service: Podiatry;  Laterality: Right;  . AMPUTATION TOE Right 05/23/2019   Procedure: AMPUTATION OF SECOND TOE METATARSAL PHALANGEAL JOINT;  Surgeon: Evelina Bucy,  DPM;  Location: [facility];  Service: Podiatry;  Laterality: Right;  . AMPUTATION TOE Right 05/27/2019   Procedure: Amputation Toe;  Surgeon: Evelina Bucy, DPM;  Location: [facility];  Service: Podiatry;  Laterality: Right;  right third toe  . APPLICATION OF WOUND VAC Right 05/27/2019   Procedure: Application Of Wound Vac;  Surgeon: Evelina Bucy, DPM;  Location: [facility];  Service: Podiatry;  Laterality: Right;  . AV FISTULA PLACEMENT  06-18-11   Right brachiocephalic AVF  . BONE BIOPSY Right 05/14/2019   Procedure: OPEN SUPERFICIAL BONE BIOPSY GREAT TOE;  Surgeon: Evelina Bucy, DPM;  Location: [facility];  Service: Podiatry;  Laterality: Right;  . EYE SURGERY     left eye for Laser, diabetic retinopathy  . FISTULOGRAM Right 11/06/2011   Procedure: FISTULOGRAM;  Surgeon: Conrad Teton, MD;  Location: [facility] C[facility];  Service: Cardiovascular;  Laterality: Right;  . HEMATOMA EVACUATION Right Feb. 25, 2014  . HEMATOMA EVACUATION Right 12/14/2012   Procedure: EVACUATION HEMATOMA;  Surgeon: Conrad [place], MD;  Location: [facility];  Service: Vascular;  Laterality: Right;  . INCISION AND DRAINAGE Right 05/23/2019   Procedure: INCISION AND DRAINAGE OF RIGHT FOOT DEEP SPACE ABSCESS;  Surgeon: Evelina Bucy, DPM;  Location: [place] [facility];  Service: Podiatry;  Laterality: Right;  . INSERTION OF DIALYSIS CATHETER  12/14/2012   Procedure: INSERTION OF DIALYSIS CATHETER;  Surgeon: Conrad Grants, MD;  Location: [facility];  Service: Vascular;;  . IRRIGATION AND DEBRIDEMENT FOOT Right 05/25/2019   Procedure: INCISION AND DRAINAGE FOOT;  Surgeon: Evelina Bucy, DPM;  Location: [facility];  Service: Podiatry;  Laterality: Right;  . IRRIGATION AND DEBRIDEMENT FOOT Right 05/27/2019   Procedure: IRRIGATION AND DEBRIDEMENT FOOT;  Surgeon: Evelina Bucy, DPM;  Location: [place];  Service: Podiatry;  Laterality: Right;  . LOWER EXTREMITY ANGIOGRAPHY Bilateral 05/17/2019   Procedure: LOWER EXTREMITY ANGIOGRAPHY;  Surgeon: Elam Dutch, MD;  Location: [facility];  Service: Cardiovascular;  Laterality: Bilateral;  . REVISON OF ARTERIOVENOUS FISTULA Right 12/14/2012   Procedure: REVISON OF upper arm ARTERIOVENOUS FISTULA using 74mx10cm gortex graft;  Surgeon: BConrad  MD;  [place]  Service: Vascular;  Laterality: Right;  . RIGHT/LEFT HEART CATH AND CORONARY ANGIOGRAPHY N/A 08/07/2020   Procedure: RIGHT/LEFT HEART CATH AND CORONARY ANGIOGRAPHY;  Surgeon: JMartinique Peter M, MD;  [facility];  Service: Cardiovascular;  Laterality: N/A;  . SOFT TISSUE MASS EXCISION     Right arm, Left leg  for MRSA infection   Family History  Problem Relation Age of Onset  . Diabetes Mother    Social History:  reports that he has never smoked. He has never used smokeless tobacco. He reports that he does not drink alcohol and does not use drugs.  ROS: As per HPI otherwise negative.  Physical Exam: Vitals:   09/22/20 1308 09/22/20 2259 09/23/20 0459 09/23/20 0741  BP: 138/83 (!) 156/80 (Marland Kitchen  146/78 130/72  Pulse:  63 61   Resp:   18   Temp:   98.2 F (36.8 C)   TempSrc:   Oral   SpO2:   93%   Weight:      Height:         General: Well developed male, alert and in no acute distress. Head: Normocephalic, atraumatic, sclera non-icteric, mucus membranes are moist. Neck:  JVD not elevated. Lungs: Clear bilaterally to auscultation without wheezes, rales, or rhonchi. Breathing is unlabored. Heart: RRR with normal S1, S2. 2/6 systolic murmur Abdomen: Soft, non-tender, non-distended with normoactive bowel sounds. No rebound/guarding. No obvious abdominal masses. Musculoskeletal:  Strength and tone appear normal for age. Lower extremities: 1+ edema bilateral lower extremities Neuro: Alert and oriented X 3. Moves all extremities spontaneously. Psych:  Responds to questions appropriately with a normal affect. Dialysis Access: RUE AVF + bruit  Allergies  Allergen Reactions  . Amoxicillin Itching    Did it involve swelling of  the face/tongue/throat, SOB, or low BP? Unknown Did it involve sudden or severe rash/hives, skin peeling, or any reaction on the inside of your mouth or nose? Unknown Did you need to seek medical attention at a hospital or doctor's office? Unknown When did it last happen?20 years ago If all above answers are "NO", may proceed with cephalosporin use.   Marland Kitchen Hydromorphone Itching    Patient states he may take with benadryl. Makes feet itch.  Marland Kitchen Percocet [Oxycodone-Acetaminophen] Itching and Other (See Comments)    Legs only. Itching and burning feeling.  . Chlorhexidine Hives    Patient has blistering  . Gabapentin Nausea And Vomiting    Stomach issues    Prior to Admission medications   Medication Sig Start Date End Date Taking? Authorizing Provider  amLODipine (NORVASC) 10 MG tablet Take 10 mg by mouth at bedtime.  08/03/20  Yes [provider]  aspirin EC 81 MG tablet Take 1 tablet (81 mg total) by mouth daily. 06/28/19  Yes Wellington Hampshire, MD  atorvastatin (LIPITOR) 40 MG tablet Take 1 tablet (40 mg total) by mouth daily. 06/28/19 09/21/20 Yes Wellington Hampshire, MD  ezetimibe (ZETIA) 10 MG tablet Take 10 mg by mouth daily. 08/23/20  Yes [provider]  hydrALAZINE (APRESOLINE) 25 MG tablet Take 1 tablet (25 mg total) by mouth every 8 (eight) hours. Do not take morning of dialysis. Patient taking differently: Take 50 mg by mouth every 8 (eight) hours. Do not take morning of dialysis. 08/09/20  Yes Freida Busman, MD  isosorbide dinitrate (ISORDIL) 20 MG tablet Take 20 mg by mouth 3 (three) times daily. 08/24/20  Yes [provider]  OVER THE COUNTER MEDICATION Take 2 capsules by mouth 2 (two) times daily. Zymessence   Yes [provider]  propranolol (INDERAL) 80 MG tablet Take 1 tablet (80 mg total) by mouth 2 (two) times daily. 08/09/20  Yes Freida Busman, MD  sevelamer carbonate (RENVELA) 800 MG tablet Take 800 mg by mouth 3 (three) times daily with  meals. Just takes with meals 07/25/20  Yes [provider]  blood glucose meter kit and supplies Dispense based on patient and insurance preference. Use up to four times daily as directed. (FOR ICD-10 E10.9, E11.9). 05/18/19   Mikhail, Velta Addison, DO  blood glucose meter kit and supplies Dispense based on patient and insurance preference. Use up to four times daily as directed. (FOR ICD-9 250.00, 250.01). 05/18/19   Cristal Ford, DO  Continuous  Blood Gluc Sensor (FREESTYLE LIBRE 14 DAY SENSOR) MISC See admin instructions. 06/20/19   [provider]  glucose blood (ACCU-CHEK AVIVA PLUS) test strip Use as instructed 05/18/19   Cristal Ford, DO  Lancet Devices ([place]) lancets Use as instructed 05/18/19   Cristal Ford, DO  loratadine (CLARITIN) 10 MG tablet Take 10 mg by mouth daily as needed for allergies.  11/06/19  [provider]   Current Facility-Administered Medications  Medication Dose Route Frequency Provider Last Rate Last Admin  . acetaminophen (TYLENOL) tablet 650 mg  650 mg Oral Q4H PRN Bonnielee Haff, MD   650 mg at 09/22/20 0657  . amLODipine (NORVASC) tablet 10 mg  10 mg Oral QHS Bonnielee Haff, MD   10 mg at 09/22/20 2259  . aspirin chewable tablet 324 mg  324 mg Oral Once Bonnielee Haff, MD      . aspirin tablet 325 mg  325 mg Oral Daily Bonnielee Haff, MD   325 mg at 09/22/20 0849  . atorvastatin (LIPITOR) tablet 40 mg  40 mg Oral Daily Bonnielee Haff, MD   40 mg at 09/22/20 2259  . docusate sodium (COLACE) capsule 100 mg  100 mg Oral BID Bonnielee Haff, MD   100 mg at 09/22/20 2259  . ezetimibe (ZETIA) tablet 10 mg  10 mg Oral Daily Bonnielee Haff, MD   10 mg at 09/22/20 0849  . guaiFENesin-dextromethorphan (ROBITUSSIN DM) 100-10 MG/5ML syrup 15 mL  15 mL Oral Q4H PRN Bonnielee Haff, MD   15 mL at 09/22/20 2257  . heparin ADULT infusion 100 units/mL (25000 units/239m sodium chloride 0.45%)  1,600 Units/hr Intravenous Continuous  MRolla Flatten RPH 16 mL/hr at 09/23/20 0104 1,600 Units/hr at 09/23/20 0104  . hydrALAZINE (APRESOLINE) tablet 25 mg  25 mg Oral Q8H KBonnielee Haff MD   25 mg at 09/23/20 0741  . insulin aspart (novoLOG) injection 0-5 Units  0-5 Units Subcutaneous QHS KBonnielee Haff MD      . insulin aspart (novoLOG) injection 0-9 Units  0-9 Units Subcutaneous TID WC KBonnielee Haff MD   2 Units at 09/22/20 1614  . magnesium hydroxide (MILK OF MAGNESIA) suspension 30 mL  30 mL Oral Daily PRN KBonnielee Haff MD   30 mL at 09/21/20 2153  . nitroGLYCERIN 50 mg in dextrose 5 % 250 mL (0.2 mg/mL) infusion  0-200 mcg/min Intravenous Continuous KBonnielee Haff MD 3 mL/hr at 09/21/20 2025 10 mcg/min at 09/21/20 2025  . ondansetron (ZOFRAN) injection 4 mg  4 mg Intravenous Q6H PRN KBonnielee Haff MD      . polyethylene glycol (MIRALAX / GLYCOLAX) packet 17 g  17 g Oral Daily KBonnielee Haff MD   17 g at 09/22/20 0859  . propranolol (INDERAL) tablet 80 mg  80 mg Oral BID KBonnielee Haff MD   80 mg at 09/22/20 2259  . sevelamer carbonate (RENVELA) tablet 800 mg  800 mg Oral TID WC KBonnielee Haff MD   800 mg at 09/22/20 1614   Labs: Basic Metabolic Panel: Recent Labs  Lab 09/21/20 1400 09/22/20 0510  NA 137 139  K 3.8 4.6  CL 93* 93*  CO2 27 29  GLUCOSE 256* 135*  BUN 19 25*  CREATININE 5.10* 6.72*  CALCIUM 9.7 9.7   Liver Function Tests: Recent Labs  Lab 09/21/20 1400  AST 37  ALT 24  ALKPHOS 185*  BILITOT 1.4*  PROT 7.7  ALBUMIN 3.6   Recent Labs  Lab 09/21/20 1400  LIPASE 23  CBC: Recent Labs  Lab 09/21/20 1400 09/22/20 0510 09/23/20 0332  WBC 5.8 5.2 4.3  HGB 12.4* 11.5* 11.2*  HCT 39.2 36.7* 34.5*  MCV 89.3 90.0 88.7  PLT 202 184 188   Cardiac Enzymes: No results for input(s): CKTOTAL, CKMB, CKMBINDEX, TROPONINI in the last 168 hours. CBG: Recent Labs  Lab 09/22/20 0807 09/22/20 1140 09/22/20 1609 09/22/20 2114 09/23/20 0827  GLUCAP 191* 196* 183* 165* 132*    Iron Studies: No results for input(s): IRON, TIBC, TRANSFERRIN, FERRITIN in the last 72 hours. Studies/Results: DG Chest Portable 1 View  Result Date: 09/21/2020 CLINICAL DATA:  chest pain EXAM: PORTABLE CHEST 1 VIEW COMPARISON:  08/05/2020 chest radiograph and prior. FINDINGS: Patchy bibasilar opacities. No pneumothorax or pleural effusion. Cardiomediastinal silhouette is unchanged. No acute osseous abnormality. IMPRESSION: Minimal patchy bibasilar opacities, likely atelectasis Electronically Signed   By: Primitivo Gauze M.D.   On: 09/21/2020 13:58    Outpatient Dialysis Orders:  Center: [facility]  on MWF. 4 hrs 0 min, 180NRe Optiflux, BFR 450, DFR Manual 800 mL/min, EDW 84.5 (kg), Dialysate 2.0 K, 2.25 Ca Heparin 2000 unit bolus Hectorol 6 mcg IVP q HD Mircera 75 mcg IVP q 2 weeks- last dose 15mh on 11/29  Assessment/Plan: 1.  NSTEMI: Hx CAD, troponins elevated, on heparin drip. Cardiology planning LHC vs continued medical management.  2.  ESRD:  Dialyzes on MWF schedule but had HD Thursday and Friday last week. Labs currently stable with no emergent indication for HD today. Next HD Monday, can plan around heart cath if needed. 3.  Hypertension/volume: Volume status improved compared to last week but still with + edema and not to EDW. UFG 2.5-3L with HD tomorrow as tolerated, continue to titrate down volume.  4.  Anemia: Hgb 11.2. No ESA indicated at this time.  5.  Metabolic bone disease: Corrected calcium borderline high. Will reduce hectorol dose. Last phos 9.8 on 12/1. Continue binders. 6.  Nutrition:  Renal/carb modified diet with fluid restrictions.   SAnice Paganini PA-C 09/23/2020, 8:55 AM  [facility] Pager: [phone]

## 2020-09-24 ENCOUNTER — Encounter (HOSPITAL_COMMUNITY)
Admission: EM | Disposition: A | Discharge status: Home / Self Care | Payer: Self-pay | Source: Home / Self Care | Attending: Internal Medicine

## 2020-09-24 DIAGNOSIS — I214 Non-ST elevation (NSTEMI) myocardial infarction: Secondary | ICD-10-CM | POA: Diagnosis not present

## 2020-09-24 DIAGNOSIS — I2510 Atherosclerotic heart disease of native coronary artery without angina pectoris: Secondary | ICD-10-CM

## 2020-09-24 HISTORY — PX: CORONARY BALLOON ANGIOPLASTY: CATH118233

## 2020-09-24 HISTORY — PX: LEFT HEART CATH AND CORONARY ANGIOGRAPHY: CATH118249

## 2020-09-24 HISTORY — PX: CORONARY STENT INTERVENTION: CATH118234

## 2020-09-24 HISTORY — PX: CORONARY PRESSURE/FFR STUDY: CATH118243

## 2020-09-24 LAB — CBC WITH DIFFERENTIAL/PLATELET
Abs Immature Granulocytes: 0.02 10*3/uL (ref 0.00–0.07)
Basophils Absolute: 0 10*3/uL (ref 0.0–0.1)
Basophils Relative: 1 %
Eosinophils Absolute: 0.2 10*3/uL (ref 0.0–0.5)
Eosinophils Relative: 4 %
HCT: 32.1 % — ABNORMAL LOW (ref 39.0–52.0)
Hemoglobin: 10.7 g/dL — ABNORMAL LOW (ref 13.0–17.0)
Immature Granulocytes: 0 %
Lymphocytes Relative: 16 %
Lymphs Abs: 0.8 10*3/uL (ref 0.7–4.0)
MCH: 29.3 pg (ref 26.0–34.0)
MCHC: 33.3 g/dL (ref 30.0–36.0)
MCV: 87.9 fL (ref 80.0–100.0)
Monocytes Absolute: 0.6 10*3/uL (ref 0.1–1.0)
Monocytes Relative: 12 %
Neutro Abs: 3.2 10*3/uL (ref 1.7–7.7)
Neutrophils Relative %: 67 %
Platelets: 149 10*3/uL — ABNORMAL LOW (ref 150–400)
RBC: 3.65 MIL/uL — ABNORMAL LOW (ref 4.22–5.81)
RDW: 18.8 % — ABNORMAL HIGH (ref 11.5–15.5)
WBC: 4.8 10*3/uL (ref 4.0–10.5)
nRBC: 0 % (ref 0.0–0.2)

## 2020-09-24 LAB — BASIC METABOLIC PANEL
Anion gap: 19 — ABNORMAL HIGH (ref 5–15)
BUN: 67 mg/dL — ABNORMAL HIGH (ref 6–20)
CO2: 26 mmol/L (ref 22–32)
Calcium: 9.1 mg/dL (ref 8.9–10.3)
Chloride: 92 mmol/L — ABNORMAL LOW (ref 98–111)
Creatinine, Ser: 10.31 mg/dL — ABNORMAL HIGH (ref 0.61–1.24)
GFR, Estimated: 5 mL/min — ABNORMAL LOW (ref >60)
Glucose, Bld: 100 mg/dL — ABNORMAL HIGH (ref 70–99)
Potassium: 5.6 mmol/L — ABNORMAL HIGH (ref 3.5–5.1)
Sodium: 137 mmol/L (ref 135–145)

## 2020-09-24 LAB — POCT ACTIVATED CLOTTING TIME: Activated Clotting Time: 416 seconds

## 2020-09-24 LAB — GLUCOSE, CAPILLARY
Glucose-Capillary: 110 mg/dL — ABNORMAL HIGH (ref 70–99)
Glucose-Capillary: 111 mg/dL — ABNORMAL HIGH (ref 70–99)
Glucose-Capillary: 84 mg/dL (ref 70–99)
Glucose-Capillary: 200 mg/dL — ABNORMAL HIGH (ref 70–99)

## 2020-09-24 SURGERY — LEFT HEART CATH AND CORONARY ANGIOGRAPHY
Anesthesia: LOCAL

## 2020-09-24 MED ORDER — DIPHENHYDRAMINE HCL 50 MG/ML IJ SOLN: 12.5000 mg | Freq: Four times a day (QID) | INTRAMUSCULAR | Status: DC | PRN

## 2020-09-24 MED ORDER — BIVALIRUDIN TRIFLUOROACETATE 250 MG IV SOLR
INTRAVENOUS | Status: AC
  Filled 2020-09-24: qty 250

## 2020-09-24 MED ORDER — SODIUM CHLORIDE 0.9% FLUSH: 3.0000 mL | Freq: Two times a day (BID) | INTRAVENOUS | Status: DC

## 2020-09-24 MED ORDER — MIDAZOLAM HCL 2 MG/2ML IJ SOLN
INTRAMUSCULAR | Status: AC
  Filled 2020-09-24: qty 2

## 2020-09-24 MED ORDER — TICAGRELOR 90 MG PO TABS
90.0000 mg | ORAL_TABLET | Freq: Two times a day (BID) | ORAL | Status: DC
  Administered 2020-09-25 – 2020-09-26 (×4): 90 mg via ORAL
  Filled 2020-09-24 (×4): qty 1

## 2020-09-24 MED ORDER — SODIUM CHLORIDE 0.9 % IV SOLN: 250.0000 mL | INTRAVENOUS | Status: DC | PRN

## 2020-09-24 MED ORDER — ASPIRIN 81 MG PO CHEW: 81.0000 mg | CHEWABLE_TABLET | ORAL | Status: DC

## 2020-09-24 MED ORDER — HEPARIN (PORCINE) IN NACL 1000-0.9 UT/500ML-% IV SOLN
INTRAVENOUS | Status: DC | PRN
  Administered 2020-09-24 (×2): 500 mL

## 2020-09-24 MED ORDER — SODIUM CHLORIDE 0.9% FLUSH: 3.0000 mL | INTRAVENOUS | Status: DC | PRN

## 2020-09-24 MED ORDER — SODIUM CHLORIDE 0.9 % IV SOLN
INTRAVENOUS | Status: DC | PRN
  Administered 2020-09-24: 1 mg/kg/h via INTRAVENOUS

## 2020-09-24 MED ORDER — IOHEXOL 350 MG/ML SOLN
INTRAVENOUS | Status: DC | PRN
  Administered 2020-09-24: 125 mL

## 2020-09-24 MED ORDER — NITROGLYCERIN IN D5W 200-5 MCG/ML-% IV SOLN
INTRAVENOUS | Status: AC
  Filled 2020-09-24: qty 250

## 2020-09-24 MED ORDER — ASPIRIN EC 81 MG PO TBEC
81.0000 mg | DELAYED_RELEASE_TABLET | Freq: Every day | ORAL | Status: DC
  Administered 2020-09-24 – 2020-09-26 (×3): 81 mg via ORAL
  Filled 2020-09-24 (×3): qty 1

## 2020-09-24 MED ORDER — NITROGLYCERIN 1 MG/10 ML FOR IR/CATH LAB
INTRA_ARTERIAL | Status: AC
  Filled 2020-09-24: qty 10

## 2020-09-24 MED ORDER — MORPHINE SULFATE (PF) 2 MG/ML IV SOLN
2.0000 mg | INTRAVENOUS | Status: DC | PRN
  Administered 2020-09-24: 2 mg via INTRAVENOUS
  Filled 2020-09-24: qty 1

## 2020-09-24 MED ORDER — SODIUM CHLORIDE 0.9% FLUSH
3.0000 mL | Freq: Two times a day (BID) | INTRAVENOUS | Status: DC
  Administered 2020-09-24 – 2020-09-26 (×4): 3 mL via INTRAVENOUS

## 2020-09-24 MED ORDER — SODIUM CHLORIDE 0.9 % IV SOLN: INTRAVENOUS | Status: DC

## 2020-09-24 MED ORDER — DIPHENHYDRAMINE HCL 50 MG/ML IJ SOLN
INTRAMUSCULAR | Status: AC
  Filled 2020-09-24: qty 1

## 2020-09-24 MED ORDER — HYDRALAZINE HCL 20 MG/ML IJ SOLN: 10.0000 mg | INTRAMUSCULAR | Status: AC | PRN

## 2020-09-24 MED ORDER — HEPARIN (PORCINE) IN NACL 1000-0.9 UT/500ML-% IV SOLN
INTRAVENOUS | Status: AC
  Filled 2020-09-24: qty 1000

## 2020-09-24 MED ORDER — LIDOCAINE HCL (PF) 1 % IJ SOLN
INTRAMUSCULAR | Status: DC | PRN
  Administered 2020-09-24: 14 mL

## 2020-09-24 MED ORDER — DIPHENHYDRAMINE HCL 50 MG/ML IJ SOLN
INTRAMUSCULAR | Status: DC | PRN
  Administered 2020-09-24 (×2): 25 mg via INTRAVENOUS

## 2020-09-24 MED ORDER — BIVALIRUDIN BOLUS VIA INFUSION - CUPID
INTRAVENOUS | Status: DC | PRN
  Administered 2020-09-24: 65.925 mg via INTRAVENOUS

## 2020-09-24 MED ORDER — TICAGRELOR 90 MG PO TABS
ORAL_TABLET | ORAL | Status: DC | PRN
  Administered 2020-09-24: 180 mg via ORAL

## 2020-09-24 MED ORDER — MIDAZOLAM HCL 2 MG/2ML IJ SOLN
INTRAMUSCULAR | Status: DC | PRN
  Administered 2020-09-24: 2 mg via INTRAVENOUS
  Administered 2020-09-24: 1 mg via INTRAVENOUS

## 2020-09-24 MED ORDER — LIDOCAINE HCL (PF) 1 % IJ SOLN
INTRAMUSCULAR | Status: AC
  Filled 2020-09-24: qty 30

## 2020-09-24 MED ORDER — DIPHENHYDRAMINE HCL 50 MG/ML IJ SOLN
12.5000 mg | Freq: Four times a day (QID) | INTRAMUSCULAR | Status: DC | PRN
  Filled 2020-09-24: qty 1

## 2020-09-24 MED ORDER — NITROGLYCERIN 1 MG/10 ML FOR IR/CATH LAB
INTRA_ARTERIAL | Status: DC | PRN
  Administered 2020-09-24 (×3): 200 ug via INTRACORONARY

## 2020-09-24 MED ORDER — TICAGRELOR 90 MG PO TABS
ORAL_TABLET | ORAL | Status: AC
  Filled 2020-09-24: qty 2

## 2020-09-24 SURGICAL SUPPLY — 19 items
BALLN SAPPHIRE 2.0X12 (BALLOONS) ×2
BALLN SAPPHIRE 2.5X12 (BALLOONS) ×2
BALLN ~~LOC~~ EUPHORA RX 2.75X8 (BALLOONS) ×2
BALLOON SAPPHIRE 2.0X12 (BALLOONS) IMPLANT
BALLOON SAPPHIRE 2.5X12 (BALLOONS) IMPLANT
BALLOON ~~LOC~~ EUPHORA RX 2.75X8 (BALLOONS) IMPLANT
CATH INFINITI JR4 5F (CATHETERS) ×1 IMPLANT
CATH LAUNCHER 6FR EBU3.5 (CATHETERS) ×1 IMPLANT
GUIDEWIRE PRESSURE COMET II (WIRE) ×1 IMPLANT
KIT ENCORE 26 ADVANTAGE (KITS) ×1 IMPLANT
KIT HEART LEFT (KITS) ×2 IMPLANT
PACK CARDIAC CATHETERIZATION (CUSTOM PROCEDURE TRAY) ×2 IMPLANT
SHEATH PINNACLE 6F 10CM (SHEATH) ×1 IMPLANT
SHEATH PROBE COVER 6X72 (BAG) ×1 IMPLANT
STENT RESOLUTE ONYX 2.5X12 (Permanent Stent) ×1 IMPLANT
TRANSDUCER W/STOPCOCK (MISCELLANEOUS) ×2 IMPLANT
TUBING CIL FLEX 10 FLL-RA (TUBING) ×2 IMPLANT
WIRE ASAHI PROWATER 180CM (WIRE) ×1 IMPLANT
WIRE EMERALD 3MM-J .035X150CM (WIRE) ×1 IMPLANT

## 2020-09-24 NOTE — Progress Notes (Addendum)
Progress Note  Patient Name: DELDRICK [name] Date of Encounter: 09/24/2020  [facility] Cardiologist: Candee Furbish, MD   Subjective   No further chest pain on IV nitro and IV heparin. No complains.   Inpatient Medications    Scheduled Meds: . amLODipine  10 mg Oral QHS  . aspirin  325 mg Oral Daily  . atorvastatin  40 mg Oral Daily  . docusate sodium  100 mg Oral BID  . doxercalciferol  4 mcg Intravenous Q M,W,F-HD  . ezetimibe  10 mg Oral Daily  . ferric citrate  420 mg Oral TID WC  . guaiFENesin  1,200 mg Oral BID  . hydrALAZINE  25 mg Oral Q8H  . insulin aspart  0-5 Units Subcutaneous QHS  . insulin aspart  0-9 Units Subcutaneous TID WC  . polyethylene glycol  17 g Oral Daily  . propranolol  80 mg Oral BID  . sevelamer carbonate  800 mg Oral TID WC   Continuous Infusions: . heparin 1,600 Units/hr (09/24/20 0716)  . nitroGLYCERIN 10 mcg/min (09/21/20 2025)   PRN Meds: acetaminophen, guaiFENesin-dextromethorphan, magnesium hydroxide, ondansetron (ZOFRAN) IV   Vital Signs    Vitals:   09/23/20 1132 09/23/20 2114 09/23/20 2123 09/24/20 0743  BP: 127/71 (!) 142/83  134/67  Pulse: 66  62 (!) 53  Resp:  17  20  Temp: 98 F (36.7 C) 98.2 F (36.8 C)  97.6 F (36.4 C)  TempSrc: Oral Oral  Oral  SpO2:  100%  94%  Weight:      Height:        Intake/Output Summary (Last 24 hours) at 09/24/2020 0833 Last data filed at 09/23/2020 2359 Gross per 24 hour  Intake 806.33 ml  Output --  Net 806.33 ml   Last 3 Weights 09/22/2020 09/21/2020 08/09/2020  Weight (lbs) 191 lb 191 lb 12.8 oz 199 lb 11.8 oz  Weight (kg) 86.637 kg 87 kg 90.6 kg      Telemetry    Sinus bradycardia/rhythm at 50-60s, PACs - Personally Reviewed  ECG    N/A  Physical Exam   GEN: No acute distress.   Neck: No JVD Cardiac: RRR, 2/6 systolic murmurs, rubs, or gallops.  Respiratory: Clear to auscultation bilaterally. GI: Soft, nontender, non-distended  MS: No edema; S/p R MTA Neuro:   Nonfocal  Psych: Normal affect   Labs    High Sensitivity Troponin:   Recent Labs  Lab 09/21/20 1400 09/21/20 1532 09/21/20 2000 09/21/20 2229  TROPONINIHS 1,701* 5,089* 13,653* 15,203*      Chemistry Recent Labs  Lab 09/21/20 1400 09/22/20 0510 09/24/20 0431  NA 137 139 137  K 3.8 4.6 5.6*  CL 93* 93* 92*  CO2 27 29 26   GLUCOSE 256* 135* 100*  BUN 19 25* 67*  CREATININE 5.10* 6.72* 10.31*  CALCIUM 9.7 9.7 9.1  PROT 7.7  --   --   ALBUMIN 3.6  --   --   AST 37  --   --   ALT 24  --   --   ALKPHOS 185*  --   --   BILITOT 1.4*  --   --   GFRNONAA 13* 9* 5*  ANIONGAP 17* 17* 19*     Hematology Recent Labs  Lab 09/22/20 0510 09/23/20 0332 09/24/20 0431  WBC 5.2 4.3 4.8  RBC 4.08* 3.89* 3.65*  HGB 11.5* 11.2* 10.7*  HCT 36.7* 34.5* 32.1*  MCV 90.0 88.7 87.9  MCH 28.2 28.8 29.3  MCHC  31.3 32.5 33.3  RDW 19.1* 18.9* 18.8*  PLT 184 188 149*    Radiology    No results found.  Cardiac Studies   None this admission   Patient Profile     54 y.o. male male with a hx of medically managed CAD, hypertension, diabetes mellitus, hyperlipidemia, chronic diastolic dysfunction, end-stage renal disease on hemodialysis Monday Wednesday Friday, peripheral vascular disease with prior toe amputation admitted with NSTEMI.   Most recently admitted October 2021 for unstable angina in setting of elevated blood pressure.Underwent cardiac cath as below: 1. Severe 3 vessel obstructive disease involving multiple branch vessels.  2. Severely elevated LV filling pressures. 30-33 mmHg 3. Severe pulmonary HTN- mean PAP 54 mm Hg 4. Good cardiac output. Index 5.33.   Plan:need to optimize medical therapy and volume status. His anatomy is poorly suited for revascularization due to small branch vessel disease. Only the second OM looks suitable for PCI and this alone would not significantly improve his outcome.  Assessment & Plan    1. NSTEMI - Hs-troponin peaked above  15,000. Remained chest pain free on IV nitro and IV heparin.  - He is NPO for possible PCI>>> looks PCI of 2nd OM could be done - continue ASA, Statin and BB  2. ESRD  On HD - He is due to dialysis. Needs to co-ordinate dialysis with cath today  3. HLD - 09/22/2020: Cholesterol 132; HDL 69; LDL Cholesterol 48; Triglycerides 73; VLDL 15 - Continue Lipitor and Zetia  4. HTN - Bp stable on current medications  5. Chronic anemia - Hgb at baseline but down from 12.4 at admit to 10.7 today - Follow closely - denies any bleeding  6. DM - Per primary team    For questions or updates, please contact [place] Please consult [email or web address] for contact info under        Signed, Leanor Kail, PA  09/24/2020, 8:33 AM    I have personally seen and examined this patient. I agree with the assessment and plan as outlined above.  Pt admitted with a NSTEMI. Known severe CAD by cath in October 2021. Will plan repeat cardiac cath today with probable PCI. Discussed with Dr. Martinique.  Continue IV heparin.   Lauree Chandler 09/24/2020 10:12 AM

## 2020-09-24 NOTE — Progress Notes (Signed)
Patient and wife wish to defer HD until tomorrow given full day with crdiac cath and late sheath pull, and concern about bleeding risk.  Texted information to nephrology.

## 2020-09-24 NOTE — Interval H&P Note (Signed)
History and Physical Interval Note:  09/24/2020 2:34 PM  [name]  has presented today for surgery, with the diagnosis of nonstemi.  The various methods of treatment have been discussed with the patient and family. After consideration of risks, benefits and other options for treatment, the patient has consented to  Procedure(s): LEFT HEART CATH AND CORONARY ANGIOGRAPHY (N/A) as a surgical intervention.  The patient's history has been reviewed, patient examined, no change in status, stable for surgery.  I have reviewed the patient's chart and labs.  Questions were answered to the patient's satisfaction.   Cath Lab Visit (complete for each Cath Lab visit)  Clinical Evaluation Leading to the Procedure:   ACS: Yes.    Non-ACS:    Anginal Classification: CCS IV  Anti-ischemic medical therapy: Maximal Therapy (2 or more classes of medications)  Non-Invasive Test Results: No non-invasive testing performed  Prior CABG: No previous CABG        [name] [facility] 09/24/2020 2:34 PM

## 2020-09-24 NOTE — H&P (View-Only) (Signed)
Progress Note  Patient Name: [name] Date of Encounter: 09/24/2020  [facility] Cardiologist: Candee Furbish, MD   Subjective   No further chest pain on IV nitro and IV heparin. No complains.   Inpatient Medications    Scheduled Meds: . amLODipine  10 mg Oral QHS  . aspirin  325 mg Oral Daily  . atorvastatin  40 mg Oral Daily  . docusate sodium  100 mg Oral BID  . doxercalciferol  4 mcg Intravenous Q M,W,F-HD  . ezetimibe  10 mg Oral Daily  . ferric citrate  420 mg Oral TID WC  . guaiFENesin  1,200 mg Oral BID  . hydrALAZINE  25 mg Oral Q8H  . insulin aspart  0-5 Units Subcutaneous QHS  . insulin aspart  0-9 Units Subcutaneous TID WC  . polyethylene glycol  17 g Oral Daily  . propranolol  80 mg Oral BID  . sevelamer carbonate  800 mg Oral TID WC   Continuous Infusions: . heparin 1,600 Units/hr (09/24/20 0716)  . nitroGLYCERIN 10 mcg/min (09/21/20 2025)   PRN Meds: acetaminophen, guaiFENesin-dextromethorphan, magnesium hydroxide, ondansetron (ZOFRAN) IV   Vital Signs    Vitals:   09/23/20 1132 09/23/20 2114 09/23/20 2123 09/24/20 0743  BP: 127/71 (!) 142/83  134/67  Pulse: 66  62 (!) 53  Resp:  17  20  Temp: 98 F (36.7 C) 98.2 F (36.8 C)  97.6 F (36.4 C)  TempSrc: Oral Oral  Oral  SpO2:  100%  94%  Weight:      Height:        Intake/Output Summary (Last 24 hours) at 09/24/2020 0833 Last data filed at 09/23/2020 2359 Gross per 24 hour  Intake 806.33 ml  Output --  Net 806.33 ml   Last 3 Weights 09/22/2020 09/21/2020 08/09/2020  Weight (lbs) 191 lb 191 lb 12.8 oz 199 lb 11.8 oz  Weight (kg) 86.637 kg 87 kg 90.6 kg      Telemetry    Sinus bradycardia/rhythm at 50-60s, PACs - Personally Reviewed  ECG    N/A  Physical Exam   GEN: No acute distress.   Neck: No JVD Cardiac: RRR, 2/6 systolic murmurs, rubs, or gallops.  Respiratory: Clear to auscultation bilaterally. GI: Soft, nontender, non-distended  MS: No edema; S/p R MTA Neuro:   Nonfocal  Psych: Normal affect   Labs    High Sensitivity Troponin:   Recent Labs  Lab 09/21/20 1400 09/21/20 1532 09/21/20 2000 09/21/20 2229  TROPONINIHS 1,701* 5,089* 13,653* 15,203*      Chemistry Recent Labs  Lab 09/21/20 1400 09/22/20 0510 09/24/20 0431  NA 137 139 137  K 3.8 4.6 5.6*  CL 93* 93* 92*  CO2 27 29 26   GLUCOSE 256* 135* 100*  BUN 19 25* 67*  CREATININE 5.10* 6.72* 10.31*  CALCIUM 9.7 9.7 9.1  PROT 7.7  --   --   ALBUMIN 3.6  --   --   AST 37  --   --   ALT 24  --   --   ALKPHOS 185*  --   --   BILITOT 1.4*  --   --   GFRNONAA 13* 9* 5*  ANIONGAP 17* 17* 19*     Hematology Recent Labs  Lab 09/22/20 0510 09/23/20 0332 09/24/20 0431  WBC 5.2 4.3 4.8  RBC 4.08* 3.89* 3.65*  HGB 11.5* 11.2* 10.7*  HCT 36.7* 34.5* 32.1*  MCV 90.0 88.7 87.9  MCH 28.2 28.8 29.3  MCHC  31.3 32.5 33.3  RDW 19.1* 18.9* 18.8*  PLT 184 188 149*    Radiology    No results found.  Cardiac Studies   None this admission   Patient Profile     54 y.o. male male with a hx of medically managed CAD, hypertension, diabetes mellitus, hyperlipidemia, chronic diastolic dysfunction, end-stage renal disease on hemodialysis Monday Wednesday Friday, peripheral vascular disease with prior toe amputation admitted with NSTEMI.   Most recently admitted October 2021 for unstable angina in setting of elevated blood pressure.Underwent cardiac cath as below: 1. Severe 3 vessel obstructive disease involving multiple branch vessels.  2. Severely elevated LV filling pressures. 30-33 mmHg 3. Severe pulmonary HTN- mean PAP 54 mm Hg 4. Good cardiac output. Index 5.33.   Plan:need to optimize medical therapy and volume status. His anatomy is poorly suited for revascularization due to small branch vessel disease. Only the second OM looks suitable for PCI and this alone would not significantly improve his outcome.  Assessment & Plan    1. NSTEMI - Hs-troponin peaked above  15,000. Remained chest pain free on IV nitro and IV heparin.  - He is NPO for possible PCI>>> looks PCI of 2nd OM could be done - continue ASA, Statin and BB  2. ESRD  On HD - He is due to dialysis. Needs to co-ordinate dialysis with cath today  3. HLD - 09/22/2020: Cholesterol 132; HDL 69; LDL Cholesterol 48; Triglycerides 73; VLDL 15 - Continue Lipitor and Zetia  4. HTN - Bp stable on current medications  5. Chronic anemia - Hgb at baseline but down from 12.4 at admit to 10.7 today - Follow closely - denies any bleeding  6. DM - Per primary team    For questions or updates, please contact [place] Please consult [email or web address] for contact info under        Signed, Leanor Kail, PA  09/24/2020, 8:33 AM    I have personally seen and examined this patient. I agree with the assessment and plan as outlined above.  Pt admitted with a NSTEMI. Known severe CAD by cath in October 2021. Will plan repeat cardiac cath today with probable PCI. Discussed with Dr. Martinique.  Continue IV heparin.   Lauree Chandler 09/24/2020 10:12 AM

## 2020-09-24 NOTE — Progress Notes (Signed)
Subjective: Seen in room and examined, no chest pain for dialysis today schedule around cardiac cath if today  Objective Vital signs in last 24 hours: Vitals:   09/23/20 1132 09/23/20 2114 09/23/20 2123 09/24/20 0743  BP: 127/71 (!) 142/83  134/67  Pulse: 66  62 (!) 53  Resp:  17  20  Temp: 98 F (36.7 C)   97.6 F (36.4 C)  TempSrc: Oral Oral  Oral  SpO2:  100%  94%  Weight:      Height:       Weight change:   Physical Exam: General: Well developed male, alert and in no acute distress. Neck:  JVD not elevated. Lungs: Clear bilaterally to auscultation without wheezes, rales, or rhonchi. Breathing is unlabored. Heart: RRR with normal S1, S2. 2/6 systolic murmur Abdomen: Soft, non-tender, non-distended with normoactive bowel sounds. Lower extremities: 1+ edema right  lower extrem, trace left Dialysis Access: RUE AVF + bruit   Outpatient Dialysis Orders:  Center: [facility]  on MWF. 4 hrs 0 min, 180NRe Optiflux, BFR 450, DFR Manual 800 mL/min, EDW 84.5 (kg), Dialysate 2.0 K, 2.25 Ca Heparin 2000 unit bolus Hectorol 6 mcg IVP q HD Mircera 75 mcg IVP q 2 weeks- last dose 19mch on 11/29  Problem/Plan:  1.  NSTEMI: Hx CAD, troponins elevated, on heparin drip/IV nitro drip cardiology planning LHC vs continued medical management.  2.  ESRD:  Dialyzes on MWF schedule but had HD Thursday and Friday last week. Labs currently stable with . Next HD today can plan around heart cath if needed. 3.  Hypertension/volume: Volume status improved compared to last week but still with + edema and not to EDW. UFG 2.5-3L with HD today as tolerated, continue to titrate down volume.  4.  Anemia: Hgb 10.7. No ESA indicated at this time.  5.  Metabolic bone disease: Corrected calcium borderline high. Will reduce hectorol dose. Last phos 9.8 on 12/1. Continue binders. 6.  Nutrition:  Renal/carb modified diet with fluid restrictions.   Ernest Haber, PA-C [facility] [phone] 09/24/2020,7:55 AM  LOS: 3 days   Labs: Basic Metabolic Panel: Recent Labs  Lab 09/21/20 1400 09/22/20 0510 09/24/20 0431  NA 137 139 137  K 3.8 4.6 5.6*  CL 93* 93* 92*  CO2 27 29 26   GLUCOSE 256* 135* 100*  BUN 19 25* 67*  CREATININE 5.10* 6.72* 10.31*  CALCIUM 9.7 9.7 9.1   Liver Function Tests: Recent Labs  Lab 09/21/20 1400  AST 37  ALT 24  ALKPHOS 185*  BILITOT 1.4*  PROT 7.7  ALBUMIN 3.6   Recent Labs  Lab 09/21/20 1400  LIPASE 23   No results for input(s): AMMONIA in the last 168 hours. CBC: Recent Labs  Lab 09/21/20 1400 09/21/20 1400 09/22/20 0510 09/23/20 0332 09/24/20 0431  WBC 5.8   < > 5.2 4.3 4.8  NEUTROABS  --   --   --   --  3.2  HGB 12.4*   < > 11.5* 11.2* 10.7*  HCT 39.2   < > 36.7* 34.5* 32.1*  MCV 89.3  --  90.0 88.7 87.9  PLT 202   < > 184 188 149*   < > = values in this interval not displayed.   Cardiac Enzymes: No results for input(s): CKTOTAL, CKMB, CKMBINDEX, TROPONINI in the last 168 hours. CBG: Recent Labs  Lab 09/22/20 2114 09/23/20 0827 09/23/20 1129 09/23/20 1608 09/23/20 2130  GLUCAP 165* 132* 175* 112* 234*  Studies/Results: No results found. Medications: . heparin 1,600 Units/hr (09/24/20 0716)  . nitroGLYCERIN 10 mcg/min (09/21/20 2025)   . amLODipine  10 mg Oral QHS  . aspirin  324 mg Oral Once  . aspirin  325 mg Oral Daily  . atorvastatin  40 mg Oral Daily  . docusate sodium  100 mg Oral BID  . doxercalciferol  4 mcg Intravenous Q M,W,F-HD  . ezetimibe  10 mg Oral Daily  . ferric citrate  420 mg Oral TID WC  . guaiFENesin  1,200 mg Oral BID  . hydrALAZINE  25 mg Oral Q8H  . insulin aspart  0-5 Units Subcutaneous QHS  . insulin aspart  0-9 Units Subcutaneous TID WC  . polyethylene glycol  17 g Oral Daily  . propranolol  80 mg Oral BID  . sevelamer carbonate  800 mg Oral TID WC

## 2020-09-24 NOTE — Progress Notes (Signed)
PROGRESS NOTE    [name]  [name] DOB: [birth year] DOA: 09/21/2020 PCP: [name] Ebbs, MD    Brief Narrative:  54 year old gentleman with history of hemodialysis Monday Wednesday Friday, hypertension, coronary artery disease, history of diabetes no longer medications, peripheral vascular disease, recent unstable angina and cardiac cath with three-vessel obstructive disease opted medical management presented back to the ER after dialysis with left-sided chest pain.  Patient was found to have elevated troponins.  Remains on nitro and heparin infusion and plan for cardiac cath today.   Assessment & Plan:   Principal Problem:   NSTEMI (non-ST elevated myocardial infarction) ([place]) Active Problems:   Essential hypertension   ESRD on dialysis [facility])   Diabetic foot ulcer- right second toe  Non-ST recent MI in the setting of known coronary artery disease and multivessel disease: Currently chest pain-free.  Followed by cardiology. Remains on heparin and nitro infusion.  Hemodynamically stable. Seen by cardiology, going for cardiac cath today.  Already on aspirin, statin and beta-blockers.  Medical management after cath as per cardiology.  ESRD on hemodialysis: Due for dialysis today after cath.  Followed by nephrology.  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  On SSI.  He is not on medications at home.  Essential hypertension: Blood pressure stable.  Anemia of chronic disease: Hemoglobin stable.  Pressure injury of right foot anterior, unstageable, local wound care.   DVT prophylaxis: IV heparin.   Code Status: Full code Family Communication: Wife at the bedside Disposition Plan: Status is: Inpatient  Remains inpatient appropriate because:Inpatient level of care appropriate due to severity of illness   Dispo:  Patient From: Home  Planned Disposition: Home  Expected discharge date: 09/25/20  Medically stable for discharge: No           Consultants:   Cardiology  Nephrology  Procedures:   Cardiac cath planned for today  Antimicrobials:   None   Subjective: Patient seen and examined in the morning rounds.  When I went to examine the patient, his wife was at the bedside.  Cardiology was also at the bedside.  Patient denied any chest pain or shortness of breath or palpitations.  Over last 24 hours he had no symptoms.  He was anxious about the upcoming procedure.  Objective: Vitals:   09/24/20 0743 09/24/20 0900 09/24/20 1124 09/24/20 1438  BP: 134/67  (!) 172/101   Pulse: (!) 53  (!) 51   Resp: 20  20   Temp: 97.6 F (36.4 C)  97.7 F (36.5 C)   TempSrc: Oral  Oral   SpO2: 94%  100% 100%  Weight:  87.9 kg    Height:        Intake/Output Summary (Last 24 hours) at 09/24/2020 1502 Last data filed at 09/23/2020 2359 Gross per 24 hour  Intake 806.33 ml  Output --  Net 806.33 ml   Filed Weights   09/21/20 1532 09/22/20 0455 09/24/20 0900  Weight: 87 kg 86.6 kg 87.9 kg    Examination:  General exam: Appears calm and comfortable  Respiratory system: Clear to auscultation. Respiratory effort normal.  Cardiovascular system: S1 & S2 heard, RRR. No JVD, murmurs, rubs, gallops or clicks. No pedal edema. Gastrointestinal system: Abdomen is nondistended, soft and nontender. No organomegaly or masses felt. Normal bowel sounds heard. Central nervous system: Alert and oriented. No focal neurological deficits. Extremities: Symmetric 5 x 5 power. Right upper extremity AV fistula with thrill. Skin: No rashes, lesions or ulcers Psychiatry: Judgement and  insight appear normal. Mood & affect appropriate.     Data Reviewed: I have personally reviewed following labs and imaging studies  CBC: Recent Labs  Lab 09/21/20 1400 09/22/20 0510 09/23/20 0332 09/24/20 0431  WBC 5.8 5.2 4.3 4.8  NEUTROABS  --   --   --  3.2  HGB 12.4* 11.5* 11.2* 10.7*  HCT 39.2 36.7* 34.5* 32.1*  MCV 89.3 90.0 88.7 87.9  PLT  202 184 188 759*   Basic Metabolic Panel: Recent Labs  Lab 09/21/20 1400 09/22/20 0510 09/24/20 0431  NA 137 139 137  K 3.8 4.6 5.6*  CL 93* 93* 92*  CO2 27 29 26   GLUCOSE 256* 135* 100*  BUN 19 25* 67*  CREATININE 5.10* 6.72* 10.31*  CALCIUM 9.7 9.7 9.1   GFR: Estimated Creatinine Clearance: 9.2 mL/min (A) (by C-G formula based on SCr of 10.31 mg/dL (H)). Liver Function Tests: Recent Labs  Lab 09/21/20 1400  AST 37  ALT 24  ALKPHOS 185*  BILITOT 1.4*  PROT 7.7  ALBUMIN 3.6   Recent Labs  Lab 09/21/20 1400  LIPASE 23   No results for input(s): AMMONIA in the last 168 hours. Coagulation Profile: No results for input(s): INR, PROTIME in the last 168 hours. Cardiac Enzymes: No results for input(s): CKTOTAL, CKMB, CKMBINDEX, TROPONINI in the last 168 hours. BNP (last 3 results) No results for input(s): PROBNP in the last 8760 hours. HbA1C: Recent Labs    09/22/20 0510  HGBA1C 7.4*   CBG: Recent Labs  Lab 09/23/20 1129 09/23/20 1608 09/23/20 2130 09/24/20 0741 09/24/20 1121  GLUCAP 175* 112* 234* 110* 111*   Lipid Profile: Recent Labs    09/22/20 0510  CHOL 132  HDL 69  LDLCALC 48  TRIG 73  CHOLHDL 1.9   Thyroid Function Tests: No results for input(s): TSH, T4TOTAL, FREET4, T3FREE, THYROIDAB in the last 72 hours. Anemia Panel: No results for input(s): VITAMINB12, FOLATE, FERRITIN, TIBC, IRON, RETICCTPCT in the last 72 hours. Sepsis Labs: No results for input(s): PROCALCITON, LATICACIDVEN in the last 168 hours.  Recent Results (from the past 240 hour(s))  Resp Panel by RT-PCR (Flu A&B, Covid) Nasopharyngeal Swab     Status: None   Collection Time: 09/21/20  1:37 PM   Specimen: Nasopharyngeal Swab; Nasopharyngeal(NP) swabs in vial transport medium  Result Value Ref Range Status   SARS Coronavirus 2 by RT PCR NEGATIVE NEGATIVE Final    Comment: (NOTE) SARS-CoV-2 target nucleic acids are NOT DETECTED.  The SARS-CoV-2 RNA is generally  detectable in upper respiratory specimens during the acute phase of infection. The lowest concentration of SARS-CoV-2 viral copies this assay can detect is 138 copies/mL. A negative result does not preclude SARS-Cov-2 infection and should not be used as the sole basis for treatment or other patient management decisions. A negative result may occur with  improper specimen collection/handling, submission of specimen other than nasopharyngeal swab, presence of viral mutation(s) within the areas targeted by this assay, and inadequate number of viral copies(<138 copies/mL). A negative result must be combined with clinical observations, patient history, and epidemiological information. The expected result is Negative.  Fact Sheet for Patients:  EntrepreneurPulse.com.au  Fact Sheet for Healthcare Providers:  IncredibleEmployment.be  This test is no t yet approved or cleared by the [place] FDA and  has been authorized for detection and/or diagnosis of SARS-CoV-2 by FDA under an Emergency Use Authorization (EUA). This EUA will remain  in effect (meaning this test can be used) for  the duration of the COVID-19 declaration under Section 564(b)(1) of the Act, 21 U.S.C.section 360bbb-3(b)(1), unless the authorization is terminated  or revoked sooner.       Influenza A by PCR NEGATIVE NEGATIVE Final   Influenza B by PCR NEGATIVE NEGATIVE Final    Comment: (NOTE) The Xpert Xpress SARS-CoV-2/FLU/RSV plus assay is intended as an aid in the diagnosis of influenza from Nasopharyngeal swab specimens and should not be used as a sole basis for treatment. Nasal washings and aspirates are unacceptable for Xpert Xpress SARS-CoV-2/FLU/RSV testing.  Fact Sheet for Patients: EntrepreneurPulse.com.au  Fact Sheet for Healthcare Providers: IncredibleEmployment.be  This test is not yet approved or cleared by the [place] FDA  and has been authorized for detection and/or diagnosis of SARS-CoV-2 by FDA under an Emergency Use Authorization (EUA). This EUA will remain in effect (meaning this test can be used) for the duration of the COVID-19 declaration under Section 564(b)(1) of the Act, 21 U.S.C. section 360bbb-3(b)(1), unless the authorization is terminated or revoked.  Performed at [facility], [place] [address], [place], [place] [address]   MRSA PCR Screening     Status: None   Collection Time: 09/23/20  7:37 PM   Specimen: Nasal Mucosa; Nasopharyngeal  Result Value Ref Range Status   MRSA by PCR NEGATIVE NEGATIVE Final    Comment:        The GeneXpert MRSA Assay (FDA approved for NASAL specimens only), is one component of a comprehensive MRSA colonization surveillance program. It is not intended to diagnose MRSA infection nor to guide or monitor treatment for MRSA infections. Performed at [facility], [place] [address], [place], [place] [address]          Radiology Studies: No results found.      Scheduled Meds: . [MAR Hold] amLODipine  10 mg Oral QHS  . [MAR Hold] aspirin EC  81 mg Oral Daily  . [MAR Hold] atorvastatin  40 mg Oral Daily  . [MAR Hold] docusate sodium  100 mg Oral BID  . [MAR Hold] doxercalciferol  4 mcg Intravenous Q M,W,F-HD  . [MAR Hold] ezetimibe  10 mg Oral Daily  . [MAR Hold] ferric citrate  420 mg Oral TID WC  . [MAR Hold] guaiFENesin  1,200 mg Oral BID  . [MAR Hold] hydrALAZINE  25 mg Oral Q8H  . [MAR Hold] insulin aspart  0-5 Units Subcutaneous QHS  . [MAR Hold] insulin aspart  0-9 Units Subcutaneous TID WC  . [MAR Hold] polyethylene glycol  17 g Oral Daily  . [MAR Hold] propranolol  80 mg Oral BID  . [MAR Hold] sevelamer carbonate  800 mg Oral TID WC  . [MAR Hold] sodium chloride flush  3 mL Intravenous Q12H   Continuous Infusions: . sodium chloride    . sodium chloride    . heparin Stopped (09/24/20 1350)  . nitroGLYCERIN Stopped (09/24/20  1445)     LOS: 3 days    Time spent: 32 minutes     Barb Merino, MD [facility] Pager [phone]

## 2020-09-24 NOTE — Plan of Care (Signed)
  Problem: Education: Goal: Understanding of CV disease, CV risk reduction, and recovery process will improve Outcome: Progressing   Problem: Education: Goal: Knowledge of General Education information will improve Description: Including pain rating scale, medication(s)/side effects and non-pharmacologic comfort measures Outcome: Progressing   Problem: Nutrition: Goal: Adequate nutrition will be maintained Outcome: Progressing   Problem: Elimination: Goal: Will not experience complications related to bowel motility Outcome: Progressing   Problem: Pain Managment: Goal: General experience of comfort will improve Outcome: Progressing   Problem: Safety: Goal: Ability to remain free from injury will improve Outcome: Progressing

## 2020-09-24 NOTE — Consult Note (Signed)
[facility] Nurse Consult Note: Patient receiving care in [facility]. Reason for Consult: right foot wound Wound type: healing wound to right lateral surgical stump Pressure Injury POA: Yes/No/NA Measurement: 1.1 cm x 1.8 cm x no measureable dtph Wound bed: pink Drainage (amount, consistency, odor) none Periwound: intact Dressing procedure/placement/frequency: Gently rub the right lateral foot wound with gauze moistened with saline. Pat dry. Place a foam dressing over the wound. Monitor the wound area(s) for worsening of condition such as: Signs/symptoms of infection,  Increase in size,  Development of or worsening of odor, Development of pain, or increased pain at the affected locations.  Notify the medical team if any of these develop.  Thank you for the consult.  Discussed plan of care with the patient and bedside nurse.  [place] nurse will not follow at this time.  Please re-consult the [place] team if needed.  Val Riles, RN, MSN, CWOCN, CNS-BC, pager [phone]

## 2020-09-25 ENCOUNTER — Encounter (HOSPITAL_COMMUNITY): Payer: Self-pay | Admitting: Cardiology

## 2020-09-25 DIAGNOSIS — I214 Non-ST elevation (NSTEMI) myocardial infarction: Secondary | ICD-10-CM | POA: Diagnosis not present

## 2020-09-25 LAB — BASIC METABOLIC PANEL
Anion gap: 20 — ABNORMAL HIGH (ref 5–15)
BUN: 85 mg/dL — ABNORMAL HIGH (ref 6–20)
CO2: 25 mmol/L (ref 22–32)
Calcium: 9.1 mg/dL (ref 8.9–10.3)
Chloride: 93 mmol/L — ABNORMAL LOW (ref 98–111)
Creatinine, Ser: 11.94 mg/dL — ABNORMAL HIGH (ref 0.61–1.24)
GFR, Estimated: 5 mL/min — ABNORMAL LOW (ref >60)
Glucose, Bld: 161 mg/dL — ABNORMAL HIGH (ref 70–99)
Potassium: 5.8 mmol/L — ABNORMAL HIGH (ref 3.5–5.1)
Sodium: 138 mmol/L (ref 135–145)

## 2020-09-25 LAB — CBC
HCT: 31.3 % — ABNORMAL LOW (ref 39.0–52.0)
Hemoglobin: 10.3 g/dL — ABNORMAL LOW (ref 13.0–17.0)
MCH: 28.9 pg (ref 26.0–34.0)
MCHC: 32.9 g/dL (ref 30.0–36.0)
MCV: 87.7 fL (ref 80.0–100.0)
Platelets: 152 10*3/uL (ref 150–400)
RBC: 3.57 MIL/uL — ABNORMAL LOW (ref 4.22–5.81)
RDW: 19.4 % — ABNORMAL HIGH (ref 11.5–15.5)
WBC: 4 10*3/uL (ref 4.0–10.5)
nRBC: 0 % (ref 0.0–0.2)

## 2020-09-25 LAB — HEPATITIS B SURFACE ANTIGEN: Hepatitis B Surface Ag: NONREACTIVE

## 2020-09-25 LAB — GLUCOSE, CAPILLARY
Glucose-Capillary: 126 mg/dL — ABNORMAL HIGH (ref 70–99)
Glucose-Capillary: 218 mg/dL — ABNORMAL HIGH (ref 70–99)
Glucose-Capillary: 202 mg/dL — ABNORMAL HIGH (ref 70–99)

## 2020-09-25 MED ORDER — TICAGRELOR 90 MG PO TABS: 90.0000 mg | ORAL_TABLET | Freq: Two times a day (BID) | ORAL | 2 refills | Status: DC

## 2020-09-25 NOTE — Progress Notes (Addendum)
Progress Note  Patient Name: [name] Date of Encounter: 09/25/2020  [facility] Cardiologist: Candee Furbish, MD   Subjective   Seen in HD. Doing well this morning. No chest pain.   Inpatient Medications    Scheduled Meds: . amLODipine  10 mg Oral QHS  . aspirin EC  81 mg Oral Daily  . atorvastatin  40 mg Oral Daily  . docusate sodium  100 mg Oral BID  . doxercalciferol  4 mcg Intravenous Q M,W,F-HD  . ezetimibe  10 mg Oral Daily  . ferric citrate  420 mg Oral TID WC  . guaiFENesin  1,200 mg Oral BID  . hydrALAZINE  25 mg Oral Q8H  . insulin aspart  0-5 Units Subcutaneous QHS  . insulin aspart  0-9 Units Subcutaneous TID WC  . polyethylene glycol  17 g Oral Daily  . propranolol  80 mg Oral BID  . sevelamer carbonate  800 mg Oral TID WC  . sodium chloride flush  3 mL Intravenous Q12H  . ticagrelor  90 mg Oral BID   Continuous Infusions: . sodium chloride     PRN Meds: sodium chloride, acetaminophen, diphenhydrAMINE, guaiFENesin-dextromethorphan, magnesium hydroxide, morphine injection, ondansetron (ZOFRAN) IV, sodium chloride flush   Vital Signs    Vitals:   09/24/20 1950 09/24/20 2110 09/25/20 0010 09/25/20 0351  BP: 134/84 (!) 147/81 (!) 150/78 137/61  Pulse:   64 61  Resp:   18   Temp:   98.1 F (36.7 C) 98.2 F (36.8 C)  TempSrc:      SpO2: 100% 100% 98% 95%  Weight:      Height:        Intake/Output Summary (Last 24 hours) at 09/25/2020 0844 Last data filed at 09/24/2020 1808 Gross per 24 hour  Intake 265.27 ml  Output -  Net 265.27 ml   Last 3 Weights 09/24/2020 09/22/2020 09/21/2020  Weight (lbs) 193 lb 12.8 oz 191 lb 191 lb 12.8 oz  Weight (kg) 87.907 kg 86.637 kg 87 kg      Telemetry    SB while in HD - Personally Reviewed  ECG    SB, nonspecific changes with prolonged QTc - Personally Reviewed  Physical Exam  Pleasant male, watching TV GEN: No acute distress.   Neck: No JVD Cardiac: RRR, 2/6 systolic murmur, no rubs, or  gallops.  Respiratory: Clear to auscultation bilaterally. GI: Soft, nontender, non-distended  MS: No edema; No deformity. Right groin stable. Right MTA Neuro:  Nonfocal  Psych: Normal affect   Labs    High Sensitivity Troponin:   Recent Labs  Lab 09/21/20 1400 09/21/20 1532 09/21/20 2000 09/21/20 2229  TROPONINIHS 1,701* 5,089* 13,653* 15,203*      Chemistry Recent Labs  Lab 09/21/20 1400 09/21/20 1400 09/22/20 0510 09/24/20 0431 09/25/20 0320  NA 137   < > 139 137 138  K 3.8   < > 4.6 5.6* 5.8*  CL 93*   < > 93* 92* 93*  CO2 27   < > 29 26 25   GLUCOSE 256*   < > 135* 100* 161*  BUN 19   < > 25* 67* 85*  CREATININE 5.10*   < > 6.72* 10.31* 11.94*  CALCIUM 9.7   < > 9.7 9.1 9.1  PROT 7.7  --   --   --   --   ALBUMIN 3.6  --   --   --   --   AST 37  --   --   --   --  ALT 24  --   --   --   --   ALKPHOS 185*  --   --   --   --   BILITOT 1.4*  --   --   --   --   GFRNONAA 13*   < > 9* 5* 5*  ANIONGAP 17*   < > 17* 19* 20*   < > = values in this interval not displayed.     Hematology Recent Labs  Lab 09/23/20 0332 09/24/20 0431 09/25/20 0320  WBC 4.3 4.8 4.0  RBC 3.89* 3.65* 3.57*  HGB 11.2* 10.7* 10.3*  HCT 34.5* 32.1* 31.3*  MCV 88.7 87.9 87.7  MCH 28.8 29.3 28.9  MCHC 32.5 33.3 32.9  RDW 18.9* 18.8* 19.4*  PLT 188 149* 152    BNPNo results for input(s): BNP, PROBNP in the last 168 hours.   DDimer No results for input(s): DDIMER in the last 168 hours.   Radiology    CARDIAC CATHETERIZATION  Result Date: 09/24/2020  Mid LAD lesion is 60% stenosed.  Dist LAD lesion is 80% stenosed.  1st Diag lesion is 90% stenosed.  2nd Diag lesion is 95% stenosed.  Balloon angioplasty was performed using a BALLOON SAPPHIRE 2.5X12.  Post intervention, there is a 80% residual stenosis.  Lat 2nd Diag lesion is 100% stenosed.  1st Mrg lesion is 95% stenosed.  2nd Mrg lesion is 90% stenosed.  A drug-eluting stent was successfully placed using a STENT RESOLUTE  ONYX 2.5X12.  Post intervention, there is a 0% residual stenosis.  RPAV lesion is 80% stenosed.  Ramus lesion is 90% stenosed.  LV end diastolic pressure is moderately elevated.  1. Severe 3 vessel obstructive CAD. Involves primarily branch and distal vessels. Anatomy really unchanged from October. The culprit vessel is the second OM. 2. Moderately elevated LVEDP 27 mm Hg 3. Unsuccessful PCI of the second diagonal. This vessel is small and tortuous. Expanded with balloon with some improvement in flow but lesion unyielding. 4. Successful PCI of the second OM with DES x 1 Plan: DAPT for one year.    Cardiac Studies   Cath: 09/24/20   Mid LAD lesion is 60% stenosed.  Dist LAD lesion is 80% stenosed.  1st Diag lesion is 90% stenosed.  2nd Diag lesion is 95% stenosed.  Balloon angioplasty was performed using a BALLOON SAPPHIRE 2.5X12.  Post intervention, there is a 80% residual stenosis.  Lat 2nd Diag lesion is 100% stenosed.  1st Mrg lesion is 95% stenosed.  2nd Mrg lesion is 90% stenosed.  A drug-eluting stent was successfully placed using a STENT RESOLUTE ONYX 2.5X12.  Post intervention, there is a 0% residual stenosis.  RPAV lesion is 80% stenosed.  Ramus lesion is 90% stenosed.  LV end diastolic pressure is moderately elevated.   1. Severe 3 vessel obstructive CAD. Involves primarily branch and distal vessels. Anatomy really unchanged from October. The culprit vessel is the second OM. 2. Moderately elevated LVEDP 27 mm Hg 3. Unsuccessful PCI of the second diagonal. This vessel is small and tortuous. Expanded with balloon with some improvement in flow but lesion unyielding.  4. Successful PCI of the second OM with DES x 1  Plan: DAPT for one year.   Diagnostic Dominance: Right  Intervention     Patient Profile     54 y.o. male with a hx of medically managed CAD, hypertension, diabetes mellitus, hyperlipidemia, chronic diastolic dysfunction, end-stage renal  disease on hemodialysis Monday Wednesday Friday, peripheral vascular disease  with prior toe amputation admitted with NSTEMI.   Assessment & Plan    1. NSTEMI: hsTn peaked >15000. Underwent cardiac cath yesterday noted above with severe 3v disease that primarily involves the branch and distal vessels. Culprit lesion felt to be the 2nd OM with successful PCI/DESx1, along with balloon angioplasty to the 2nd diag with some improvement in flow. Placed on DAPT with ASA/Brilinta for at least one year. Medical therapy for residual disease. Doing well this morning, needs to work with CR once HD completed.  -- on ASA, Brilinta, statin. Unable to add BB with bradycardia.   2. HTN: blood pressures are variable with HD. Treated with amlodipine 10mg  daily, hydralazine 25mg  TID, propranolol 80mg  BID  3. HLD: on Lipitor and Zetia  4. ESRD on HD: receiving HD this morning.  5. Chronic anemia: Hgb stable 10.3 today.  6. Hyperkalemia: K+ 5.8, undergoing HD  For questions or updates, please contact [place] Please consult [email or web address] for contact info under     Signed, Reino Bellis, NP  09/25/2020, 8:44 AM    Patient seen, examined. Available data reviewed. Agree with findings, assessment, and plan as outlined by Reino Bellis, NP.  Patient independently interviewed and examined in the hemodialysis unit this morning.  He is alert, oriented, no distress.  He feels well.  JVP is normal, lungs are clear to auscultation bilaterally, heart is regular rate and rhythm with a 2/6 systolic ejection murmur at the right upper sternal border, abdomen is soft and nontender, extremities have no edema.  The right groin site is clear with no ecchymosis or hematoma.  The dressing is saturated but no active bleeding is seen.  The patient has done very well with PCI.  He appears to be asymptomatic now.  He should continue on dual antiplatelet therapy with aspirin and Brilinta for at least 12 months in the setting of  non-STEMI/PCI.  Importance of medication adherence with dual antiplatelet therapy is reviewed.  From a cardiac standpoint, the patient is stable for discharge.  Will arrange his post hospital cardiology follow-up.  Otherwise as above, further care per nephrology and medicine teams.  Sherren Mocha, M.D. 09/25/2020 11:04 AM

## 2020-09-25 NOTE — Progress Notes (Addendum)
Subjective: Seen on hemodialysis, HD schedule changed 2/2 scheduling emergent patients / no chest pain, cardiac catheter done yesterday noted results.  Hemodialysis tomorrow to keep on schedule as discussed with patient  Objective Vital signs in last 24 hours: Vitals:   09/24/20 1950 09/24/20 2110 09/25/20 0010 09/25/20 0351  BP: 134/84 (!) 147/81 (!) 150/78 137/61  Pulse:   64 61  Resp:   18   Temp:   98.1 F (36.7 C) 98.2 F (36.8 C)  TempSrc:      SpO2: 100% 100% 98% 95%  Weight:      Height:       Weight change:   Physical Exam: General:On hemodialysis NAD  Neck: No JVD  Lungs: Clear bilaterally to auscultation without wheezes, rales, or rhonchi. Breathing is unlabored. Heart:RRR with normal S1, S2. 2/6 systolic murmur Abdomen: Soft, non-tender, non-distended with normoactive bowel sounds. Lower extremities:Trace edema right  lower extrem, trace left Dialysis Access:RUE AVF + bruit   [facility]:  [facility] 0 min, 180NRe Optiflux, BFR 450, DFR Manual 800 mL/min, EDW 84.5 (kg), Dialysate 2.0 K, 2.25 Ca Heparin 2000 unit bolus Hectorol 6 mcg IVP q HD Mircera 75 mcg IVP q 2 weeks- last dose 152mch on 11/29  Problem/Plan:  1. NSTEMI: Hx CAD, troponins elevated, cardiac cath yesterday with CAD and PCI noted further planning per cardiology 2. ESRD:Dialyzes on MWF schedule but working around cath schedule yesterday and thus hemo today/back on hemodialysis  tomorrow.  This can be done as outpatient if patient is discharged. 3. Hypertension/volume:Volume status improved compared to last week /UFG 2.5-3L with HD today as tolerated, continue to titrate down volume. 4. Anemia:Hgb 10.3. No ESA indicated at this time. 5. Metabolic bone disease:Corrected calcium borderline high. Will reduce hectorol dose. Last phos 9.8 on 12/1. Continue binders. 6. Nutrition:Renal/carb modified diet with fluid  restrictions.  Ernest Haber, PA-C [facility] [phone] 09/25/2020,8:28 AM  LOS: 4 days   Labs: Basic Metabolic Panel: Recent Labs  Lab 09/22/20 0510 09/24/20 0431 09/25/20 0320  NA 139 137 138  K 4.6 5.6* 5.8*  CL 93* 92* 93*  CO2 29 26 25   GLUCOSE 135* 100* 161*  BUN 25* 67* 85*  CREATININE 6.72* 10.31* 11.94*  CALCIUM 9.7 9.1 9.1   Liver Function Tests: Recent Labs  Lab 09/21/20 1400  AST 37  ALT 24  ALKPHOS 185*  BILITOT 1.4*  PROT 7.7  ALBUMIN 3.6   Recent Labs  Lab 09/21/20 1400  LIPASE 23   No results for input(s): AMMONIA in the last 168 hours. CBC: Recent Labs  Lab 09/21/20 1400 09/21/20 1400 09/22/20 0510 09/22/20 0510 09/23/20 0332 09/24/20 0431 09/25/20 0320  WBC 5.8   < > 5.2   < > 4.3 4.8 4.0  NEUTROABS  --   --   --   --   --  3.2  --   HGB 12.4*   < > 11.5*   < > 11.2* 10.7* 10.3*  HCT 39.2   < > 36.7*   < > 34.5* 32.1* 31.3*  MCV 89.3  --  90.0  --  88.7 87.9 87.7  PLT 202   < > 184   < > 188 149* 152   < > = values in this interval not displayed.   Cardiac Enzymes: No results for input(s): CKTOTAL, CKMB, CKMBINDEX, TROPONINI in the last 168 hours. CBG: Recent Labs  Lab 09/23/20 2130 09/24/20 0741 09/24/20 1121 09/24/20  1710 09/24/20 2200  GLUCAP 234* 110* 111* 84 200*    Studies/Results: CARDIAC CATHETERIZATION  Result Date: 09/24/2020  Mid LAD lesion is 60% stenosed.  Dist LAD lesion is 80% stenosed.  1st Diag lesion is 90% stenosed.  2nd Diag lesion is 95% stenosed.  Balloon angioplasty was performed using a BALLOON SAPPHIRE 2.5X12.  Post intervention, there is a 80% residual stenosis.  Lat 2nd Diag lesion is 100% stenosed.  1st Mrg lesion is 95% stenosed.  2nd Mrg lesion is 90% stenosed.  A drug-eluting stent was successfully placed using a STENT RESOLUTE ONYX 2.5X12.  Post intervention, there is a 0% residual stenosis.  RPAV lesion is 80% stenosed.  Ramus lesion is 90% stenosed.  LV end  diastolic pressure is moderately elevated.  1. Severe 3 vessel obstructive CAD. Involves primarily branch and distal vessels. Anatomy really unchanged from October. The culprit vessel is the second OM. 2. Moderately elevated LVEDP 27 mm Hg 3. Unsuccessful PCI of the second diagonal. This vessel is small and tortuous. Expanded with balloon with some improvement in flow but lesion unyielding. 4. Successful PCI of the second OM with DES x 1 Plan: DAPT for one year.   Medications: . sodium chloride     . amLODipine  10 mg Oral QHS  . aspirin EC  81 mg Oral Daily  . atorvastatin  40 mg Oral Daily  . docusate sodium  100 mg Oral BID  . doxercalciferol  4 mcg Intravenous Q M,W,F-HD  . ezetimibe  10 mg Oral Daily  . ferric citrate  420 mg Oral TID WC  . guaiFENesin  1,200 mg Oral BID  . hydrALAZINE  25 mg Oral Q8H  . insulin aspart  0-5 Units Subcutaneous QHS  . insulin aspart  0-9 Units Subcutaneous TID WC  . polyethylene glycol  17 g Oral Daily  . propranolol  80 mg Oral BID  . sevelamer carbonate  800 mg Oral TID WC  . sodium chloride flush  3 mL Intravenous Q12H  . ticagrelor  90 mg Oral BID   Nephrology attending: Patient was seen and examined.  Chart reviewed.  I agree with assessment plan as outlined above. Status post cardiac cath yesterday with PCI.  Clinically asymptomatic.  Tolerated dialysis well.  HD today off schedule, regular HD tomorrow.  He is stable to discharge from renal perspective.  Resume outpatient dialysis.

## 2020-09-25 NOTE — Progress Notes (Signed)
PROGRESS NOTE    SADIK PIASCIK  QIW:[id number] DOB: 1966-01-30 DOA: 09/21/2020 PCP: [name], MD    Brief Narrative:  54 year old gentleman with history of hemodialysis Monday Wednesday Friday, hypertension, coronary artery disease, history of diabetes no longer on medications, peripheral vascular disease, recent unstable angina and cardiac cath with three-vessel obstructive disease opted medical management presented back to the ER after dialysis with left-sided chest pain.  Patient was found to have elevated troponins.  Remained on nitro and heparin infusion. Cardiac cath 12/6 HD 12/7 Patient clinically stabilizing.  He had bleeding into his femoral sheath on removal as well as bleeding into AV fistula after dialysis so discharge was discontinued today.  Anticipate discharge tomorrow after dialysis.   Assessment & Plan:   Principal Problem:   NSTEMI (non-ST elevated myocardial infarction) ([place]) Active Problems:   Essential hypertension   ESRD on dialysis [facility])   Diabetic foot ulcer- right second toe  Non-ST recent MI in the setting of known coronary artery disease and multivessel disease: Currently chest pain-free.   Cardiac cath 12/6 with severe three-vessel disease. Second OM PCI with drug-eluting stent, balloon angioplasty to the second diagonal. Clinically improving. Aspirin and Brilinta for 1 year.  Statin.  On propanolol.  ESRD on hemodialysis: Getting dialysis today.  Next dialysis tomorrow.  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  On SSI.  He is not on medications at home.  Essential hypertension: Blood pressure stable.  Anemia of chronic disease: Hemoglobin stable.  Pressure injury of right foot anterior, unstageable, local wound care.   DVT prophylaxis: SCD's Start: 09/24/20 1624IV heparin.   Code Status: Full code Family Communication: Wife at the bedside Disposition Plan: Status is: Inpatient  Remains inpatient appropriate  because:Inpatient level of care appropriate due to severity of illness   Dispo:  Patient From: Home  Planned Disposition: Home  Expected discharge date: 09/25/20  Medically stable for discharge: No          Consultants:   Cardiology  Nephrology  Procedures:   Cardiac cath planned for today  Antimicrobials:   None   Subjective: Patient seen and examined in the morning rounds.  He was getting hemodialysis.  Denies any complaints.  Getting ready to go home, however continues to have oozing from his AV fistula.  Objective: Vitals:   09/25/20 1100 09/25/20 1130 09/25/20 1147 09/25/20 1200  BP: (!) 175/50 (!) 177/53 (!) 183/71 (!) 162/51  Pulse: 62     Resp: 15 16 15 14   Temp:   98.6 F (37 C)   TempSrc:   Oral   SpO2:   96%   Weight:   86.2 kg   Height:        Intake/Output Summary (Last 24 hours) at 09/25/2020 1505 Last data filed at 09/25/2020 1147 Gross per 24 hour  Intake 265.27 ml  Output 3000 ml  Net -2734.73 ml   Filed Weights   09/24/20 0900 09/25/20 0737 09/25/20 1147  Weight: 87.9 kg 89.5 kg 86.2 kg    Examination:  General exam: Appears calm and comfortable  Respiratory system: Clear to auscultation. Respiratory effort normal.  Cardiovascular system: S1 & S2 heard, RRR. No JVD, murmurs, rubs, gallops or clicks. No pedal edema. Gastrointestinal system: Abdomen is nondistended, soft and nontender. No organomegaly or masses felt. Normal bowel sounds heard. Central nervous system: Alert and oriented. No focal neurological deficits. Extremities: Symmetric 5 x 5 power. Right upper extremity AV fistula with thrill. Oozing with pressure dressing applied.  Skin: No rashes, lesions or ulcers Psychiatry: Judgement and insight appear normal. Mood & affect appropriate.     Data Reviewed: I have personally reviewed following labs and imaging studies  CBC: Recent Labs  Lab 09/21/20 1400 09/22/20 0510 09/23/20 0332 09/24/20 0431 09/25/20 0320   WBC 5.8 5.2 4.3 4.8 4.0  NEUTROABS  --   --   --  3.2  --   HGB 12.4* 11.5* 11.2* 10.7* 10.3*  HCT 39.2 36.7* 34.5* 32.1* 31.3*  MCV 89.3 90.0 88.7 87.9 87.7  PLT 202 184 188 149* 268   Basic Metabolic Panel: Recent Labs  Lab 09/21/20 1400 09/22/20 0510 09/24/20 0431 09/25/20 0320  NA 137 139 137 138  K 3.8 4.6 5.6* 5.8*  CL 93* 93* 92* 93*  CO2 27 29 26 25   GLUCOSE 256* 135* 100* 161*  BUN 19 25* 67* 85*  CREATININE 5.10* 6.72* 10.31* 11.94*  CALCIUM 9.7 9.7 9.1 9.1   GFR: Estimated Creatinine Clearance: 7.3 mL/min (A) (by C-G formula based on SCr of 11.94 mg/dL (H)). Liver Function Tests: Recent Labs  Lab 09/21/20 1400  AST 37  ALT 24  ALKPHOS 185*  BILITOT 1.4*  PROT 7.7  ALBUMIN 3.6   Recent Labs  Lab 09/21/20 1400  LIPASE 23   No results for input(s): AMMONIA in the last 168 hours. Coagulation Profile: No results for input(s): INR, PROTIME in the last 168 hours. Cardiac Enzymes: No results for input(s): CKTOTAL, CKMB, CKMBINDEX, TROPONINI in the last 168 hours. BNP (last 3 results) No results for input(s): PROBNP in the last 8760 hours. HbA1C: No results for input(s): HGBA1C in the last 72 hours. CBG: Recent Labs  Lab 09/24/20 0741 09/24/20 1121 09/24/20 1710 09/24/20 2200 09/25/20 1322  GLUCAP 110* 111* 84 200* 126*   Lipid Profile: No results for input(s): CHOL, HDL, LDLCALC, TRIG, CHOLHDL, LDLDIRECT in the last 72 hours. Thyroid Function Tests: No results for input(s): TSH, T4TOTAL, FREET4, T3FREE, THYROIDAB in the last 72 hours. Anemia Panel: No results for input(s): VITAMINB12, FOLATE, FERRITIN, TIBC, IRON, RETICCTPCT in the last 72 hours. Sepsis Labs: No results for input(s): PROCALCITON, LATICACIDVEN in the last 168 hours.  Recent Results (from the past 240 hour(s))  Resp Panel by RT-PCR (Flu A&B, Covid) Nasopharyngeal Swab     Status: None   Collection Time: 09/21/20  1:37 PM   Specimen: Nasopharyngeal Swab; Nasopharyngeal(NP)  swabs in vial transport medium  Result Value Ref Range Status   SARS Coronavirus 2 by RT PCR NEGATIVE NEGATIVE Final    Comment: (NOTE) SARS-CoV-2 target nucleic acids are NOT DETECTED.  The SARS-CoV-2 RNA is generally detectable in upper respiratory specimens during the acute phase of infection. The lowest concentration of SARS-CoV-2 viral copies this assay can detect is 138 copies/mL. A negative result does not preclude SARS-Cov-2 infection and should not be used as the sole basis for treatment or other patient management decisions. A negative result may occur with  improper specimen collection/handling, submission of specimen other than nasopharyngeal swab, presence of viral mutation(s) within the areas targeted by this assay, and inadequate number of viral copies(<138 copies/mL). A negative result must be combined with clinical observations, patient history, and epidemiological information. The expected result is Negative.  Fact Sheet for Patients:  EntrepreneurPulse.com.au  Fact Sheet for Healthcare Providers:  IncredibleEmployment.be  This test is no t yet approved or cleared by the [place] FDA and  has been authorized for detection and/or diagnosis of SARS-CoV-2 by FDA under an  Emergency Use Authorization (EUA). This EUA will remain  in effect (meaning this test can be used) for the duration of the COVID-19 declaration under Section 564(b)(1) of the Act, 21 U.S.C.section 360bbb-3(b)(1), unless the authorization is terminated  or revoked sooner.       Influenza A by PCR NEGATIVE NEGATIVE Final   Influenza B by PCR NEGATIVE NEGATIVE Final    Comment: (NOTE) The Xpert Xpress SARS-CoV-2/FLU/RSV plus assay is intended as an aid in the diagnosis of influenza from Nasopharyngeal swab specimens and should not be used as a sole basis for treatment. Nasal washings and aspirates are unacceptable for Xpert Xpress SARS-CoV-2/FLU/RSV  testing.  Fact Sheet for Patients: EntrepreneurPulse.com.au  Fact Sheet for Healthcare Providers: IncredibleEmployment.be  This test is not yet approved or cleared by the [place] FDA and has been authorized for detection and/or diagnosis of SARS-CoV-2 by FDA under an Emergency Use Authorization (EUA). This EUA will remain in effect (meaning this test can be used) for the duration of the COVID-19 declaration under Section 564(b)(1) of the Act, 21 U.S.C. section 360bbb-3(b)(1), unless the authorization is terminated or revoked.  Performed at [facility], [place] [address] [place], [place] [address]   MRSA PCR Screening     Status: None   Collection Time: 09/23/20  7:37 PM   Specimen: Nasal Mucosa; Nasopharyngeal  Result Value Ref Range Status   MRSA by PCR NEGATIVE NEGATIVE Final    Comment:        The GeneXpert MRSA Assay (FDA approved for NASAL specimens only), is one component of a comprehensive MRSA colonization surveillance program. It is not intended to diagnose MRSA infection nor to guide or monitor treatment for MRSA infections. Performed at [facility], [place] [address] [place], [place] [address]          Radiology Studies: CARDIAC CATHETERIZATION  Result Date: 09/24/2020  Mid LAD lesion is 60% stenosed.  Dist LAD lesion is 80% stenosed.  1st Diag lesion is 90% stenosed.  2nd Diag lesion is 95% stenosed.  Balloon angioplasty was performed using a BALLOON SAPPHIRE 2.5X12.  Post intervention, there is a 80% residual stenosis.  Lat 2nd Diag lesion is 100% stenosed.  1st Mrg lesion is 95% stenosed.  2nd Mrg lesion is 90% stenosed.  A drug-eluting stent was successfully placed using a STENT RESOLUTE ONYX 2.5X12.  Post intervention, there is a 0% residual stenosis.  RPAV lesion is 80% stenosed.  Ramus lesion is 90% stenosed.  LV end diastolic pressure is moderately elevated.  1. Severe 3 vessel  obstructive CAD. Involves primarily branch and distal vessels. Anatomy really unchanged from October. The culprit vessel is the second OM. 2. Moderately elevated LVEDP 27 mm Hg 3. Unsuccessful PCI of the second diagonal. This vessel is small and tortuous. Expanded with balloon with some improvement in flow but lesion unyielding. 4. Successful PCI of the second OM with DES x 1 Plan: DAPT for one year.        Scheduled Meds: . amLODipine  10 mg Oral QHS  . aspirin EC  81 mg Oral Daily  . atorvastatin  40 mg Oral Daily  . docusate sodium  100 mg Oral BID  . doxercalciferol  4 mcg Intravenous Q M,W,F-HD  . ezetimibe  10 mg Oral Daily  . ferric citrate  420 mg Oral TID WC  . guaiFENesin  1,200 mg Oral BID  . hydrALAZINE  25 mg Oral Q8H  . insulin aspart  0-5 Units  Subcutaneous QHS  . insulin aspart  0-9 Units Subcutaneous TID WC  . polyethylene glycol  17 g Oral Daily  . propranolol  80 mg Oral BID  . sevelamer carbonate  800 mg Oral TID WC  . sodium chloride flush  3 mL Intravenous Q12H  . ticagrelor  90 mg Oral BID   Continuous Infusions: . sodium chloride       LOS: 4 days    Time spent: 32 minutes     Barb Merino, MD [facility] Pager [phone]

## 2020-09-25 NOTE — Progress Notes (Signed)
Patient considered for DC home today.  Difficult sheath pull with bleeding last night, and bleeding from fistula post HD today.  Patient and wife concerned about bleed risk if DC's today; MD agreed to observe overnight.

## 2020-09-25 NOTE — Progress Notes (Signed)
8413-2440 MI education completed with pt who voiced understanding. Stressed importance of brilinta with stent. Reviewed NTG use, MI restrictions, walking for exercise(pt knows limitations with right foot), and CRP 2. Encouraged pt to discuss his diet with dietitian at HD since on renal and diabetic diets. Referral to [facility] for CRP 2 but pt prefers to exercise on his own. His dialysis days are MWF and he is limited by right foot. He stated he has a small area that still needs to heal.  Referral to meet protocol. Did not walk pt at this time as he stated he needs to clean up from bleeding from dialysis site. Has bandage on that is clean but gown and bed affected.  Can walk with staff later. Graylon Good RN BSN 09/25/2020 2:23 PM

## 2020-09-25 NOTE — Progress Notes (Signed)
Benefits check in process for Brilinta 90mg  twice a day. Nurse to provide and explain Brilinta copay card. Whitman Hero RN,BSN,CM

## 2020-09-26 ENCOUNTER — Other Ambulatory Visit (HOSPITAL_COMMUNITY): Payer: Self-pay | Admitting: Internal Medicine

## 2020-09-26 DIAGNOSIS — I214 Non-ST elevation (NSTEMI) myocardial infarction: Secondary | ICD-10-CM | POA: Diagnosis not present

## 2020-09-26 DIAGNOSIS — N186 End stage renal disease: Secondary | ICD-10-CM | POA: Diagnosis not present

## 2020-09-26 DIAGNOSIS — Z992 Dependence on renal dialysis: Secondary | ICD-10-CM | POA: Diagnosis not present

## 2020-09-26 DIAGNOSIS — I2510 Atherosclerotic heart disease of native coronary artery without angina pectoris: Secondary | ICD-10-CM | POA: Diagnosis not present

## 2020-09-26 LAB — GLUCOSE, CAPILLARY
Glucose-Capillary: 156 mg/dL — ABNORMAL HIGH (ref 70–99)
Glucose-Capillary: 125 mg/dL — ABNORMAL HIGH (ref 70–99)

## 2020-09-26 MED ORDER — PROPRANOLOL HCL 80 MG PO TABS
80.0000 mg | ORAL_TABLET | Freq: Two times a day (BID) | ORAL | 1 refills | Status: DC
Start: 2020-09-26 — End: 2021-09-12

## 2020-09-26 MED ORDER — TICAGRELOR 90 MG PO TABS: 90.0000 mg | ORAL_TABLET | Freq: Two times a day (BID) | ORAL | 2 refills | Status: DC

## 2020-09-26 MED FILL — BRILINTA 90 MG TABLET: 90 | 30 days supply | Qty: 60 | Fill #0

## 2020-09-26 NOTE — Care Management (Signed)
09-26-20 1417 Patient is aware that the Brilinta 30 day free will be filled in the [facility] and additional medications will be filled at [facility]. Bethena Roys, RN,BSN Case Manager

## 2020-09-26 NOTE — Progress Notes (Signed)
CARDIAC REHAB PHASE I   PRE:  Rate/Rhythm: 63 SR  BP:  Supine:   Sitting: 160/85  Standing:    SaO2: 99%RA  MODE:  Ambulation: 650 ft   POST:  Rate/Rhythm: 71 SR  BP:  Supine:   Sitting: 173/91  Standing:    SaO2: 98%RA 0800-0842 Pt walked 650 ft on RA with steady gait. No c/o CP. Tolerated well.   Graylon Good, RN BSN  09/26/2020 8:38 AM

## 2020-09-26 NOTE — Progress Notes (Signed)
Renal Navigator notes plan for discharge after HD today and spoke with Renal PA and Attending to see if patient can be rescheduled at outpatient clinic. Renal agrees. I have gotten patient arranged at his outpatient clinic either today at 11:30am or tomorrow at 6:30am. Attending notes that today would be difficult to make within this timeframe. Renal Navigator met with patient who states he says he would prefer HD here today, but says that if we cannot get him done here by 5pm when his wife gets off work, he will want to discharge. The HD Charge Nurse, says he will not be able to be called for HD until at least 6pm when the evening RN comes in due to high census. Navigator has updated medical team. Patient can continue to wait in hospital if he chooses to see if anything changes if he prefers, but does have appointment confirmed for tomorrow at 6:30am at [facility].  Alphonzo Cruise, [place] Renal Navigator [phone]

## 2020-09-26 NOTE — Discharge Instructions (Signed)

## 2020-09-26 NOTE — Discharge Summary (Signed)
Physician Discharge Summary  MONTERRIO GERST ZWC:[id number] DOB: May 24, 1966  PCP: [name], MD  Admitted from: Home Discharged to: Home  Admit date: 09/21/2020 Discharge date: 09/26/2020  Recommendations for Outpatient Follow-up:    Follow-up Information    [name], MD. Schedule an appointment as soon as possible for a visit in 1 week(s).   Specialty: Internal Medicine Contact information: [place] [place] [address] [phone]        Hemodialysis center Follow up on 09/27/2020.   Why: Proceed for your prescheduled dialysis appointment.         Jerline Pain, MD. Schedule an appointment as soon as possible for a visit.   Specialty: Cardiology Why: MDs office will also call you with a follow-up appointment. Contact information: [address] [place] [address] [phone]                Home Health: None    Equipment/Devices: None    Discharge Condition: Improved and stable   Code Status: Full Code Diet recommendation:  Discharge Diet Orders (From admission, onward)    Start     Ordered   09/25/20 0000  Diet - low sodium heart healthy        09/25/20 1412   09/25/20 0000  Diet Carb Modified        09/25/20 1412           Discharge Diagnoses:  Principal Problem:   NSTEMI (non-ST elevated myocardial infarction) ([place]) Active Problems:   Essential hypertension   ESRD on dialysis ([place])   Diabetic foot ulcer- right second toe   Brief Summary: 54 year old married male with PMH of ESRD on MWF hemodialysis, hypertension, CAD, DM 2 no longer on medications, PAD, recent unstable angina and cardiac cath with three-vessel obstructive disease opted medical management, presented back to the ED after dialysis with left-sided chest pain. Noted to have elevated troponins. Placed on nitro and heparin infusion. Cardiology was consulted. He underwent cardiac cath on 12/6 followed by hemodialysis on 12/7. Anticipated  discharge yesterday was canceled due to bleeding from his right femoral cath site as well as from his right upper extremity AV fistula site.   Assessment and plan:  1. NSTEMI: HS troponin peaked to >15 K. Cardiology was consulted. He underwent cardiac cath on 09/24/2020, detailed report as below but in summary showed severe three-vessel disease that primarily involved the branch and distal vessels. Culprit lesion felt to be the second OM with successful PCI/DES x1, along with balloon angioplasty to the second diagonal with some improvement in flow. As per cardiology follow-up yesterday, asymptomatic of chest pain, should remain on dual antiplatelet therapy with aspirin and Brilinta for at least 12 months in the setting of non-STEMI/PCI. Importance of medication adherence with dual antiplatelet therapy was reviewed with the patient. Cardiology cleared him for discharge and will arrange outpatient follow-up. As per discussion with TOC team today, prescription for Brilinta was sent to the [facility] so patient can have this medication prior to discharge from the hospital. Subsequent prescriptions have been called into the outside pharmacy as well. Already on beta-blocker/propranolol 2. Essential hypertension: Blood pressures fluctuating as noted below and seem to vary with hemodialysis as well. Continue prior home dose of amlodipine, hydralazine and propranolol. Close outpatient follow-up with PCP/nephrology and cardiology. 3. Hyperlipidemia: 4. Continue prior home dose of atorvastatin and Zetia. 5. ESRD on hemodialysis: Underwent post cath off cycle hemodialysis on 12/7. Reportedly had significant amount of bleeding from  his RUE AV fistula site. According to patient, he had similar significant bleeding from cath site and AV fistula site in the past and eventually had to go to [facility]. Thereby he was reluctant to discharge yesterday. Nephrology follow-up appreciated. I discussed with the nephrology team, they are  unable to dialyze him until in the late evening/night shift, patient does not wish to stay that long and wants to be discharged. Nephrology have arranged for hemodialysis on 09/27/2020 at 6:30 AM and patient is advised to go for the same. He verbalizes understanding. 6. Hyperkalemia: Potassium 5.8 yesterday. This was before dialysis. As per discussion with Dr. Carolin Sicks, Nephrology, this is not concerning since he had dialysis after that result and this can be followed up at outpatient dialysis. 7. Anemia in ESRD: Hemoglobin stable. Outpatient follow-up and labs can be drawn across HD 8. Type II DM with renal complications: E7M 7.4 on 12/4. Reportedly has been taken off of meds. Given that his A1c is still high, recommend close outpatient follow-up with PCP regarding further management. 9. Metabolic bone disease: Outpatient management per nephrology.  Pressure Injury 09/21/20 Foot Anterior;Right Unstageable - Full thickness tissue loss in which the base of the injury is covered by slough (yellow, tan, gray, green or brown) and/or eschar (tan, brown or black) in the wound bed. (Active)  09/21/20 2000  Location: Foot  Location Orientation: Anterior;Right  Staging: Unstageable - Full thickness tissue loss in which the base of the injury is covered by slough (yellow, tan, gray, green or brown) and/or eschar (tan, brown or black) in the wound bed.  Wound Description (Comments):   Present on Admission: Yes       Consultations:  Cardiology  Nephrology  Procedures:  Cardiac catheterization 09/24/2020:  Conclusion:  Mid LAD lesion is 60% stenosed.  Dist LAD lesion is 80% stenosed.  1st Diag lesion is 90% stenosed.  2nd Diag lesion is 95% stenosed.  Balloon angioplasty was performed using a BALLOON SAPPHIRE 2.5X12.  Post intervention, there is a 80% residual stenosis.  Lat 2nd Diag lesion is 100% stenosed.  1st Mrg lesion is 95% stenosed.  2nd Mrg lesion is 90% stenosed.  A  drug-eluting stent was successfully placed using a STENT RESOLUTE ONYX 2.5X12.  Post intervention, there is a 0% residual stenosis.  RPAV lesion is 80% stenosed.  Ramus lesion is 90% stenosed.  LV end diastolic pressure is moderately elevated.   1. Severe 3 vessel obstructive CAD. Involves primarily branch and distal vessels. Anatomy really unchanged from October. The culprit vessel is the second OM. 2. Moderately elevated LVEDP 27 mm Hg 3. Unsuccessful PCI of the second diagonal. This vessel is small and tortuous. Expanded with balloon with some improvement in flow but lesion unyielding.  4. Successful PCI of the second OM with DES x 1  Plan: DAPT for one year.     Discharge Instructions  Discharge Instructions    Amb Referral to Cardiac Rehabilitation   Complete by: As directed    Diagnosis:  Coronary Stents NSTEMI     After initial evaluation and assessments completed: Virtual Based Care may be provided alone or in conjunction with Phase 2 Cardiac Rehab based on patient barriers.: Yes   Call MD for:   Complete by: As directed    Recurrent chest pain.   Call MD for:  difficulty breathing, headache or visual disturbances   Complete by: As directed    Call MD for:  extreme fatigue   Complete by: As directed  Call MD for:  extreme fatigue   Complete by: As directed    Call MD for:  persistant dizziness or light-headedness   Complete by: As directed    Call MD for:  persistant nausea and vomiting   Complete by: As directed    Call MD for:  redness, tenderness, or signs of infection (pain, swelling, redness, odor or green/yellow discharge around incision site)   Complete by: As directed    Call MD for:  severe uncontrolled pain   Complete by: As directed    Call MD for:  temperature >100.4   Complete by: As directed    Diet - low sodium heart healthy   Complete by: As directed    Diet Carb Modified   Complete by: As directed    Discharge wound care:   Complete by:  As directed    Gently rub the right lateral foot wound with gauze moistened with saline. Pat dry. Place a foam dressing over the wound   Discharge wound care:   Complete by: As directed    As per earlier instructions.   Increase activity slowly   Complete by: As directed        Medication List    TAKE these medications   Accu-Chek Aviva Plus test strip Generic drug: glucose blood Use as instructed   accu-chek softclix lancets Use as instructed   amLODipine 10 MG tablet Commonly known as: NORVASC Take 10 mg by mouth at bedtime.   aspirin EC 81 MG tablet Take 1 tablet (81 mg total) by mouth daily.   atorvastatin 40 MG tablet Commonly known as: LIPITOR Take 1 tablet (40 mg total) by mouth daily.   blood glucose meter kit and supplies Dispense based on patient and insurance preference. Use up to four times daily as directed. (FOR ICD-10 E10.9, E11.9).   blood glucose meter kit and supplies Dispense based on patient and insurance preference. Use up to four times daily as directed. (FOR ICD-9 250.00, 250.01).   ezetimibe 10 MG tablet Commonly known as: ZETIA Take 10 mg by mouth daily.   FreeStyle [employer] 14 Day Sensor Misc See admin instructions.   hydrALAZINE 25 MG tablet Commonly known as: APRESOLINE Take 1 tablet (25 mg total) by mouth every 8 (eight) hours. Do not take morning of dialysis. What changed: how much to take   isosorbide dinitrate 20 MG tablet Commonly known as: ISORDIL Take 20 mg by mouth 3 (three) times daily.   OVER THE COUNTER MEDICATION Take 2 capsules by mouth 2 (two) times daily. Zymessence   propranolol 80 MG tablet Commonly known as: INDERAL Take 1 tablet (80 mg total) by mouth 2 (two) times daily.   sevelamer carbonate 800 MG tablet Commonly known as: RENVELA Take 800 mg by mouth 3 (three) times daily with meals. Just takes with meals   ticagrelor 90 MG Tabs tablet Commonly known as: BRILINTA Take 1 tablet (90 mg total) by mouth 2  (two) times daily.      Allergies  Allergen Reactions  . Amoxicillin Itching    Did it involve swelling of the face/tongue/throat, SOB, or low BP? Unknown Did it involve sudden or severe rash/hives, skin peeling, or any reaction on the inside of your mouth or nose? Unknown Did you need to seek medical attention at a hospital or doctor's office? Unknown When did it last happen?20 years ago If all above answers are "NO", may proceed with cephalosporin use.   Marland Kitchen Hydromorphone Itching  Patient states he may take with benadryl. Makes feet itch.  Marland Kitchen Percocet [Oxycodone-Acetaminophen] Itching and Other (See Comments)    Legs only. Itching and burning feeling.  . Chlorhexidine Hives    Patient has blistering  . Gabapentin Nausea And Vomiting    Stomach issues       Procedures/Studies: CARDIAC CATHETERIZATION  Result Date: 09/24/2020  Mid LAD lesion is 60% stenosed.  Dist LAD lesion is 80% stenosed.  1st Diag lesion is 90% stenosed.  2nd Diag lesion is 95% stenosed.  Balloon angioplasty was performed using a BALLOON SAPPHIRE 2.5X12.  Post intervention, there is a 80% residual stenosis.  Lat 2nd Diag lesion is 100% stenosed.  1st Mrg lesion is 95% stenosed.  2nd Mrg lesion is 90% stenosed.  A drug-eluting stent was successfully placed using a STENT RESOLUTE ONYX 2.5X12.  Post intervention, there is a 0% residual stenosis.  RPAV lesion is 80% stenosed.  Ramus lesion is 90% stenosed.  LV end diastolic pressure is moderately elevated.  1. Severe 3 vessel obstructive CAD. Involves primarily branch and distal vessels. Anatomy really unchanged from October. The culprit vessel is the second OM. 2. Moderately elevated LVEDP 27 mm Hg 3. Unsuccessful PCI of the second diagonal. This vessel is small and tortuous. Expanded with balloon with some improvement in flow but lesion unyielding. 4. Successful PCI of the second OM with DES x 1 Plan: DAPT for one year.   DG Chest Portable 1  View  Result Date: 09/21/2020 CLINICAL DATA:  chest pain EXAM: PORTABLE CHEST 1 VIEW COMPARISON:  08/05/2020 chest radiograph and prior. FINDINGS: Patchy bibasilar opacities. No pneumothorax or pleural effusion. Cardiomediastinal silhouette is unchanged. No acute osseous abnormality. IMPRESSION: Minimal patchy bibasilar opacities, likely atelectasis Electronically Signed   By: Primitivo Gauze M.D.   On: 09/21/2020 13:58      Subjective: Patient seen this morning. Denies complaints. No chest pain. No further bleeding. Reports that he actually did not have any bleeding from his right femoral cath site but mostly had bleeding from right upper arm AV fistula site which stopped yesterday evening and has not recurred. As per discussion with multiple personnel, patient does not wish to stay for late evening/night dialysis today and wishes to be discharged home. No dyspnea, dizziness or lightheadedness.  Discharge Exam:  Vitals:   09/25/20 1200 09/25/20 2050 09/26/20 0603 09/26/20 0822  BP: (!) 162/51 (!) 176/89 (!) 163/55 (!) 160/85  Pulse:  61    Resp: '14 16 18   ' Temp:  98.2 F (36.8 C) 98.1 F (36.7 C)   TempSrc:  Oral Oral   SpO2:  100% 100%   Weight:      Height:        General: Pleasant Schatzman male, moderately built and nourished sitting up comfortably in chair this morning. Cardiovascular: S1 & S2 heard, RRR, S1/S2 +. No murmurs, rubs, gallops or clicks. No JVD or pedal edema. Telemetry personally reviewed: Sinus rhythm. Respiratory: Clear to auscultation without wheezing, rhonchi or crackles. No increased work of breathing. Abdominal:  Non distended, non tender & soft. No organomegaly or masses appreciated. Normal bowel sounds heard. CNS: Alert and oriented. No focal deficits. Right upper extremity AV fistula site without acute findings or bleeding. Right groin cardiac cath site without hematoma or active bleeding or acute findings. Extremities: no edema, no cyanosis    The  results of significant diagnostics from this hospitalization (including imaging, microbiology, ancillary and laboratory) are listed below for reference.  Microbiology: Recent Results (from the past 240 hour(s))  Resp Panel by RT-PCR (Flu A&B, Covid) Nasopharyngeal Swab     Status: None   Collection Time: 09/21/20  1:37 PM   Specimen: Nasopharyngeal Swab; Nasopharyngeal(NP) swabs in vial transport medium  Result Value Ref Range Status   SARS Coronavirus 2 by RT PCR NEGATIVE NEGATIVE Final    Comment: (NOTE) SARS-CoV-2 target nucleic acids are NOT DETECTED.  The SARS-CoV-2 RNA is generally detectable in upper respiratory specimens during the acute phase of infection. The lowest concentration of SARS-CoV-2 viral copies this assay can detect is 138 copies/mL. A negative result does not preclude SARS-Cov-2 infection and should not be used as the sole basis for treatment or other patient management decisions. A negative result may occur with  improper specimen collection/handling, submission of specimen other than nasopharyngeal swab, presence of viral mutation(s) within the areas targeted by this assay, and inadequate number of viral copies(<138 copies/mL). A negative result must be combined with clinical observations, patient history, and epidemiological information. The expected result is Negative.  Fact Sheet for Patients:  EntrepreneurPulse.com.au  Fact Sheet for Healthcare Providers:  IncredibleEmployment.be  This test is no t yet approved or cleared by the [place] FDA and  has been authorized for detection and/or diagnosis of SARS-CoV-2 by FDA under an Emergency Use Authorization (EUA). This EUA will remain  in effect (meaning this test can be used) for the duration of the COVID-19 declaration under Section 564(b)(1) of the Act, 21 U.S.C.section 360bbb-3(b)(1), unless the authorization is terminated  or revoked sooner.        Influenza A by PCR NEGATIVE NEGATIVE Final   Influenza B by PCR NEGATIVE NEGATIVE Final    Comment: (NOTE) The Xpert Xpress SARS-CoV-2/FLU/RSV plus assay is intended as an aid in the diagnosis of influenza from Nasopharyngeal swab specimens and should not be used as a sole basis for treatment. Nasal washings and aspirates are unacceptable for Xpert Xpress SARS-CoV-2/FLU/RSV testing.  Fact Sheet for Patients: EntrepreneurPulse.com.au  Fact Sheet for Healthcare Providers: IncredibleEmployment.be  This test is not yet approved or cleared by the [place] FDA and has been authorized for detection and/or diagnosis of SARS-CoV-2 by FDA under an Emergency Use Authorization (EUA). This EUA will remain in effect (meaning this test can be used) for the duration of the COVID-19 declaration under Section 564(b)(1) of the Act, 21 U.S.C. section 360bbb-3(b)(1), unless the authorization is terminated or revoked.  Performed at [facility], [place] [address], [place], [place] [address]   MRSA PCR Screening     Status: None   Collection Time: 09/23/20  7:37 PM   Specimen: Nasal Mucosa; Nasopharyngeal  Result Value Ref Range Status   MRSA by PCR NEGATIVE NEGATIVE Final    Comment:        The GeneXpert MRSA Assay (FDA approved for NASAL specimens only), is one component of a comprehensive MRSA colonization surveillance program. It is not intended to diagnose MRSA infection nor to guide or monitor treatment for MRSA infections. Performed at [facility], [place] [address] [place], [place] [address]      Labs: CBC: Recent Labs  Lab 09/21/20 1400 09/22/20 0510 09/23/20 0332 09/24/20 0431 09/25/20 0320  WBC 5.8 5.2 4.3 4.8 4.0  NEUTROABS  --   --   --  3.2  --   HGB 12.4* 11.5* 11.2* 10.7* 10.3*  HCT 39.2 36.7* 34.5* 32.1* 31.3*  MCV 89.3 90.0 88.7 87.9 87.7  PLT 202  184 188 149* 276    Basic Metabolic Panel: Recent Labs  Lab  09/21/20 1400 09/22/20 0510 09/24/20 0431 09/25/20 0320  NA 137 139 137 138  K 3.8 4.6 5.6* 5.8*  CL 93* 93* 92* 93*  CO2 '27 29 26 25  ' GLUCOSE 256* 135* 100* 161*  BUN 19 25* 67* 85*  CREATININE 5.10* 6.72* 10.31* 11.94*  CALCIUM 9.7 9.7 9.1 9.1    Liver Function Tests: Recent Labs  Lab 09/21/20 1400  AST 37  ALT 24  ALKPHOS 185*  BILITOT 1.4*  PROT 7.7  ALBUMIN 3.6    CBG: Recent Labs  Lab 09/25/20 1322 09/25/20 1641 09/25/20 2053 09/26/20 0732 09/26/20 1130  GLUCAP 126* 218* 202* 156* 125*      Time coordinating discharge: 35 minutes  SIGNED:  Vernell Leep, MD, FACP, [facility]. [facility]  To contact the attending provider between 7A-7P or the covering provider during after hours 7P-7A, please log into the web site [email or web address] and access using universal [place] password for that web site. If you do not have the password, please call the hospital operator.

## 2020-09-26 NOTE — Care Management Important Message (Signed)
Important Message  Patient Details  Name: [name] MRN: [id number] Date of Birth: Mar 01, 1966   Medicare Important Message Given:  Yes     Shelda Altes 09/26/2020, 9:31 AM

## 2020-09-26 NOTE — Care Management (Signed)
09-26-20 1105 Benefits check submitted for Brilinta. Case Manager will follow for cost. Graves-Bigelow, Ocie Cornfield, RN, BSN Case Manager

## 2020-09-26 NOTE — Progress Notes (Signed)
Subjective: Up in chair eating breakfast no complaints, no chest pain said tolerated dialysis yesterday ,for dialysis today on schedule , possible discharge after  Objective Vital signs in last 24 hours: Vitals:   09/25/20 1147 09/25/20 1200 09/25/20 2050 09/26/20 0603  BP: (!) 183/71 (!) 162/51 (!) 176/89 (!) 163/55  Pulse:   61   Resp: 15 14 16 18   Temp: 98.6 F (37 C)  98.2 F (36.8 C) 98.1 F (36.7 C)  TempSrc: Oral  Oral Oral  SpO2: 96%  100% 100%  Weight: 86.2 kg     Height:       Weight change: 1.593 kg  Physical Exam: General: Alert up in chair NAD  Neck: No JVD  Lungs: Clear bilaterally to auscultation without wheezes, rales, or rhonchi. Breathing is unlabored. Heart:RRR with normal S1, S2. 2/6 systolic murmur Abdomen: Soft, non-tender, non-distended with normoactive bowel sounds. Lower extremities:Trace edemarightlower extrem left no edema Dialysis Access:RUE AVF + bruit   [facility]patient[facility]ialysis Orders:  [facility] 0 min, 180NRe Optiflux, BFR 450, DFR Manual 800 mL/min, EDW 84.5 (kg), Dialysate 2.0 K, 2.25 Ca Heparin 2000 unit bolus Hectorol 6 mcg IVP q HD Mircera 75 mcg IVP q 2 weeks- last dose 178mch on 11/29  Problem/Plan:  1. NSTEMI: Hx CAD, troponins elevated, cardiac cath 09/24/20 with CAD and PCI noted further planning per cardiology 2. ESRD:Dialyzes on MWF schedule scheduled today for dialysis potassium 5.8 3. Hypertension/volume:Volume status improved compared to last week /UFG -3L with HD todayas tolerated. 4. Anemia:Hgb10.3. No ESA indicated at this time. Follow-up Hgb trend 5. Metabolic bone disease:Corrected calcium borderline high. Will reduce hectorol dose. Last phos 9.8 on 12/1. Continue binders. 6. Nutrition:Renal/carb modified diet with fluid restrictions  Ernest Haber, PA-C [facility] Bee[facility] [phone] 09/26/2020,9:56 AM  LOS: 5 days   Labs: Basic Metabolic  Panel: Recent Labs  Lab 09/22/20 0510 09/24/20 0431 09/25/20 0320  NA 139 137 138  K 4.6 5.6* 5.8*  CL 93* 92* 93*  CO2 29 26 25   GLUCOSE 135* 100* 161*  BUN 25* 67* 85*  CREATININE 6.72* 10.31* 11.94*  CALCIUM 9.7 9.1 9.1   Liver Function Tests: Recent Labs  Lab 09/21/20 1400  AST 37  ALT 24  ALKPHOS 185*  BILITOT 1.4*  PROT 7.7  ALBUMIN 3.6   Recent Labs  Lab 09/21/20 1400  LIPASE 23   No results for input(s): AMMONIA in the last 168 hours. CBC: Recent Labs  Lab 09/21/20 1400 09/21/20 1400 09/22/20 0510 09/22/20 0510 09/23/20 0332 09/24/20 0431 09/25/20 0320  WBC 5.8   < > 5.2   < > 4.3 4.8 4.0  NEUTROABS  --   --   --   --   --  3.2  --   HGB 12.4*   < > 11.5*   < > 11.2* 10.7* 10.3*  HCT 39.2   < > 36.7*   < > 34.5* 32.1* 31.3*  MCV 89.3  --  90.0  --  88.7 87.9 87.7  PLT 202   < > 184   < > 188 149* 152   < > = values in this interval not displayed.   Cardiac Enzymes: No results for input(s): CKTOTAL, CKMB, CKMBINDEX, TROPONINI in the last 168 hours. CBG: Recent Labs  Lab 09/24/20 2200 09/25/20 1322 09/25/20 1641 09/25/20 2053 09/26/20 0732  GLUCAP 200* 126* 218* 202* 156*    Studies/Results: CARDIAC CATHETERIZATION  Result Date: 09/24/2020  Mid LAD  lesion is 60% stenosed.  Dist LAD lesion is 80% stenosed.  1st Diag lesion is 90% stenosed.  2nd Diag lesion is 95% stenosed.  Balloon angioplasty was performed using a BALLOON SAPPHIRE 2.5X12.  Post intervention, there is a 80% residual stenosis.  Lat 2nd Diag lesion is 100% stenosed.  1st Mrg lesion is 95% stenosed.  2nd Mrg lesion is 90% stenosed.  A drug-eluting stent was successfully placed using a STENT RESOLUTE ONYX 2.5X12.  Post intervention, there is a 0% residual stenosis.  RPAV lesion is 80% stenosed.  Ramus lesion is 90% stenosed.  LV end diastolic pressure is moderately elevated.  1. Severe 3 vessel obstructive CAD. Involves primarily branch and distal vessels. Anatomy  really unchanged from October. The culprit vessel is the second OM. 2. Moderately elevated LVEDP 27 mm Hg 3. Unsuccessful PCI of the second diagonal. This vessel is small and tortuous. Expanded with balloon with some improvement in flow but lesion unyielding. 4. Successful PCI of the second OM with DES x 1 Plan: DAPT for one year.   Medications: . sodium chloride     . amLODipine  10 mg Oral QHS  . aspirin EC  81 mg Oral Daily  . atorvastatin  40 mg Oral Daily  . docusate sodium  100 mg Oral BID  . doxercalciferol  4 mcg Intravenous Q M,W,F-HD  . ezetimibe  10 mg Oral Daily  . ferric citrate  420 mg Oral TID WC  . guaiFENesin  1,200 mg Oral BID  . hydrALAZINE  25 mg Oral Q8H  . insulin aspart  0-5 Units Subcutaneous QHS  . insulin aspart  0-9 Units Subcutaneous TID WC  . polyethylene glycol  17 g Oral Daily  . propranolol  80 mg Oral BID  . sevelamer carbonate  800 mg Oral TID WC  . sodium chloride flush  3 mL Intravenous Q12H  . ticagrelor  90 mg Oral BID

## 2020-09-26 NOTE — Progress Notes (Signed)
Pt's scheduled for HD this afternoon, pt doesn't want to wait any longer, requested to go home. Hongalgi, MD notified.

## 2020-09-26 NOTE — TOC Benefit Eligibility Note (Signed)
Transition of Care [facility]) Benefit Eligibility Note    Patient Details  Name: [name] MRN: [id number] Date of Birth: May 23, 1966   Medication/Dose: BRILINTA  90 MG BID  Covered?: Yes  Tier: 3 Drug  Prescription Coverage Preferred Pharmacy: [facility] and  [facility]  Spoke with Person/Company/Phone Number:: [facility]  @  [employer] # [phone]  Co-Pay: $35.00  Prior Approval: No  Deductible: Met  Additional Notes: TICAGRELTO : [name] Phone Number: 09/26/2020, 1:04 PM

## 2020-09-27 ENCOUNTER — Telehealth: Payer: Self-pay | Admitting: Nephrology

## 2020-09-27 NOTE — Telephone Encounter (Signed)
Transition of Care Contact from [facility]  Date of Discharge: 09/26/20 Date of Contact: 09/27/20 Method of contact: phone - attempted  Attempted to contact patient to discuss transition of care from inpatient admission.  Patient did not answer the phone.  Message was left on patient's voicemail informing them we would attempt to call them again and if unable to reach will follow up at dialysis.  Jen Mow, PA-C [facility] Pager: [phone]

## 2020-09-28 DIAGNOSIS — N2581 Secondary hyperparathyroidism of renal origin: Secondary | ICD-10-CM | POA: Diagnosis not present

## 2020-09-28 DIAGNOSIS — Z992 Dependence on renal dialysis: Secondary | ICD-10-CM | POA: Diagnosis not present

## 2020-09-28 DIAGNOSIS — D509 Iron deficiency anemia, unspecified: Secondary | ICD-10-CM | POA: Diagnosis not present

## 2020-09-28 DIAGNOSIS — N186 End stage renal disease: Secondary | ICD-10-CM | POA: Diagnosis not present

## 2020-09-28 DIAGNOSIS — D631 Anemia in chronic kidney disease: Secondary | ICD-10-CM | POA: Diagnosis not present

## 2020-10-01 DIAGNOSIS — Z992 Dependence on renal dialysis: Secondary | ICD-10-CM | POA: Diagnosis not present

## 2020-10-01 DIAGNOSIS — N186 End stage renal disease: Secondary | ICD-10-CM | POA: Diagnosis not present

## 2020-10-01 DIAGNOSIS — D509 Iron deficiency anemia, unspecified: Secondary | ICD-10-CM | POA: Diagnosis not present

## 2020-10-01 DIAGNOSIS — D631 Anemia in chronic kidney disease: Secondary | ICD-10-CM | POA: Diagnosis not present

## 2020-10-01 DIAGNOSIS — N2581 Secondary hyperparathyroidism of renal origin: Secondary | ICD-10-CM | POA: Diagnosis not present

## 2020-10-02 ENCOUNTER — Ambulatory Visit: Payer: Medicare Other | Admitting: Cardiology

## 2020-10-02 ENCOUNTER — Telehealth (HOSPITAL_COMMUNITY): Payer: Self-pay

## 2020-10-02 NOTE — Telephone Encounter (Signed)
Per phase I pt is not interested in CR at this time.  Closed referral

## 2020-10-03 DIAGNOSIS — Z992 Dependence on renal dialysis: Secondary | ICD-10-CM | POA: Diagnosis not present

## 2020-10-03 DIAGNOSIS — D509 Iron deficiency anemia, unspecified: Secondary | ICD-10-CM | POA: Diagnosis not present

## 2020-10-03 DIAGNOSIS — N2581 Secondary hyperparathyroidism of renal origin: Secondary | ICD-10-CM | POA: Diagnosis not present

## 2020-10-03 DIAGNOSIS — N186 End stage renal disease: Secondary | ICD-10-CM | POA: Diagnosis not present

## 2020-10-03 DIAGNOSIS — D631 Anemia in chronic kidney disease: Secondary | ICD-10-CM | POA: Diagnosis not present

## 2020-10-04 DIAGNOSIS — I2510 Atherosclerotic heart disease of native coronary artery without angina pectoris: Secondary | ICD-10-CM | POA: Diagnosis not present

## 2020-10-04 DIAGNOSIS — L97525 Non-pressure chronic ulcer of other part of left foot with muscle involvement without evidence of necrosis: Secondary | ICD-10-CM | POA: Diagnosis not present

## 2020-10-05 DIAGNOSIS — Z992 Dependence on renal dialysis: Secondary | ICD-10-CM | POA: Diagnosis not present

## 2020-10-05 DIAGNOSIS — N186 End stage renal disease: Secondary | ICD-10-CM | POA: Diagnosis not present

## 2020-10-05 DIAGNOSIS — D631 Anemia in chronic kidney disease: Secondary | ICD-10-CM | POA: Diagnosis not present

## 2020-10-05 DIAGNOSIS — N2581 Secondary hyperparathyroidism of renal origin: Secondary | ICD-10-CM | POA: Diagnosis not present

## 2020-10-05 DIAGNOSIS — D509 Iron deficiency anemia, unspecified: Secondary | ICD-10-CM | POA: Diagnosis not present

## 2020-10-08 DIAGNOSIS — D509 Iron deficiency anemia, unspecified: Secondary | ICD-10-CM | POA: Diagnosis not present

## 2020-10-08 DIAGNOSIS — Z992 Dependence on renal dialysis: Secondary | ICD-10-CM | POA: Diagnosis not present

## 2020-10-08 DIAGNOSIS — N2581 Secondary hyperparathyroidism of renal origin: Secondary | ICD-10-CM | POA: Diagnosis not present

## 2020-10-08 DIAGNOSIS — D631 Anemia in chronic kidney disease: Secondary | ICD-10-CM | POA: Diagnosis not present

## 2020-10-08 DIAGNOSIS — N186 End stage renal disease: Secondary | ICD-10-CM | POA: Diagnosis not present

## 2020-10-10 DIAGNOSIS — D509 Iron deficiency anemia, unspecified: Secondary | ICD-10-CM | POA: Diagnosis not present

## 2020-10-10 DIAGNOSIS — N2581 Secondary hyperparathyroidism of renal origin: Secondary | ICD-10-CM | POA: Diagnosis not present

## 2020-10-10 DIAGNOSIS — N186 End stage renal disease: Secondary | ICD-10-CM | POA: Diagnosis not present

## 2020-10-10 DIAGNOSIS — D631 Anemia in chronic kidney disease: Secondary | ICD-10-CM | POA: Diagnosis not present

## 2020-10-10 DIAGNOSIS — Z992 Dependence on renal dialysis: Secondary | ICD-10-CM | POA: Diagnosis not present

## 2020-10-12 DIAGNOSIS — D631 Anemia in chronic kidney disease: Secondary | ICD-10-CM | POA: Diagnosis not present

## 2020-10-12 DIAGNOSIS — D509 Iron deficiency anemia, unspecified: Secondary | ICD-10-CM | POA: Diagnosis not present

## 2020-10-12 DIAGNOSIS — N186 End stage renal disease: Secondary | ICD-10-CM | POA: Diagnosis not present

## 2020-10-12 DIAGNOSIS — Z992 Dependence on renal dialysis: Secondary | ICD-10-CM | POA: Diagnosis not present

## 2020-10-12 DIAGNOSIS — N2581 Secondary hyperparathyroidism of renal origin: Secondary | ICD-10-CM | POA: Diagnosis not present

## 2020-10-15 DIAGNOSIS — Z992 Dependence on renal dialysis: Secondary | ICD-10-CM | POA: Diagnosis not present

## 2020-10-15 DIAGNOSIS — N2581 Secondary hyperparathyroidism of renal origin: Secondary | ICD-10-CM | POA: Diagnosis not present

## 2020-10-15 DIAGNOSIS — D631 Anemia in chronic kidney disease: Secondary | ICD-10-CM | POA: Diagnosis not present

## 2020-10-15 DIAGNOSIS — D509 Iron deficiency anemia, unspecified: Secondary | ICD-10-CM | POA: Diagnosis not present

## 2020-10-15 DIAGNOSIS — N186 End stage renal disease: Secondary | ICD-10-CM | POA: Diagnosis not present

## 2020-10-16 DIAGNOSIS — Z7982 Long term (current) use of aspirin: Secondary | ICD-10-CM | POA: Diagnosis not present

## 2020-10-16 DIAGNOSIS — I25118 Atherosclerotic heart disease of native coronary artery with other forms of angina pectoris: Secondary | ICD-10-CM | POA: Diagnosis not present

## 2020-10-16 DIAGNOSIS — Z7984 Long term (current) use of oral hypoglycemic drugs: Secondary | ICD-10-CM | POA: Diagnosis not present

## 2020-10-16 DIAGNOSIS — I890 Lymphedema, not elsewhere classified: Secondary | ICD-10-CM | POA: Diagnosis not present

## 2020-10-16 DIAGNOSIS — I10 Essential (primary) hypertension: Secondary | ICD-10-CM | POA: Diagnosis not present

## 2020-10-16 DIAGNOSIS — I2510 Atherosclerotic heart disease of native coronary artery without angina pectoris: Secondary | ICD-10-CM | POA: Diagnosis not present

## 2020-10-16 DIAGNOSIS — Z794 Long term (current) use of insulin: Secondary | ICD-10-CM | POA: Diagnosis not present

## 2020-10-16 DIAGNOSIS — E782 Mixed hyperlipidemia: Secondary | ICD-10-CM | POA: Diagnosis not present

## 2020-10-16 DIAGNOSIS — E1151 Type 2 diabetes mellitus with diabetic peripheral angiopathy without gangrene: Secondary | ICD-10-CM | POA: Diagnosis not present

## 2020-10-16 DIAGNOSIS — I2584 Coronary atherosclerosis due to calcified coronary lesion: Secondary | ICD-10-CM | POA: Diagnosis not present

## 2020-10-16 DIAGNOSIS — N186 End stage renal disease: Secondary | ICD-10-CM | POA: Diagnosis not present

## 2020-10-16 DIAGNOSIS — E1122 Type 2 diabetes mellitus with diabetic chronic kidney disease: Secondary | ICD-10-CM | POA: Diagnosis not present

## 2020-10-16 DIAGNOSIS — I252 Old myocardial infarction: Secondary | ICD-10-CM | POA: Diagnosis not present

## 2020-10-16 DIAGNOSIS — E875 Hyperkalemia: Secondary | ICD-10-CM | POA: Diagnosis not present

## 2020-10-16 DIAGNOSIS — Z955 Presence of coronary angioplasty implant and graft: Secondary | ICD-10-CM | POA: Diagnosis not present

## 2020-10-16 DIAGNOSIS — Z992 Dependence on renal dialysis: Secondary | ICD-10-CM | POA: Diagnosis not present

## 2020-10-16 DIAGNOSIS — D631 Anemia in chronic kidney disease: Secondary | ICD-10-CM | POA: Diagnosis not present

## 2020-10-16 DIAGNOSIS — N250 Renal osteodystrophy: Secondary | ICD-10-CM | POA: Diagnosis not present

## 2020-10-16 DIAGNOSIS — I120 Hypertensive chronic kidney disease with stage 5 chronic kidney disease or end stage renal disease: Secondary | ICD-10-CM | POA: Diagnosis not present

## 2020-10-16 DIAGNOSIS — Z79899 Other long term (current) drug therapy: Secondary | ICD-10-CM | POA: Diagnosis not present

## 2020-10-17 DIAGNOSIS — N186 End stage renal disease: Secondary | ICD-10-CM | POA: Diagnosis not present

## 2020-10-17 DIAGNOSIS — E1151 Type 2 diabetes mellitus with diabetic peripheral angiopathy without gangrene: Secondary | ICD-10-CM | POA: Diagnosis not present

## 2020-10-17 DIAGNOSIS — D631 Anemia in chronic kidney disease: Secondary | ICD-10-CM | POA: Diagnosis not present

## 2020-10-17 DIAGNOSIS — I25118 Atherosclerotic heart disease of native coronary artery with other forms of angina pectoris: Secondary | ICD-10-CM | POA: Diagnosis not present

## 2020-10-17 DIAGNOSIS — I120 Hypertensive chronic kidney disease with stage 5 chronic kidney disease or end stage renal disease: Secondary | ICD-10-CM | POA: Diagnosis not present

## 2020-10-17 DIAGNOSIS — E1122 Type 2 diabetes mellitus with diabetic chronic kidney disease: Secondary | ICD-10-CM | POA: Diagnosis not present

## 2020-10-18 DIAGNOSIS — L97512 Non-pressure chronic ulcer of other part of right foot with fat layer exposed: Secondary | ICD-10-CM | POA: Diagnosis not present

## 2020-10-18 DIAGNOSIS — L97511 Non-pressure chronic ulcer of other part of right foot limited to breakdown of skin: Secondary | ICD-10-CM | POA: Diagnosis not present

## 2020-10-19 DIAGNOSIS — D631 Anemia in chronic kidney disease: Secondary | ICD-10-CM | POA: Diagnosis not present

## 2020-10-19 DIAGNOSIS — Z992 Dependence on renal dialysis: Secondary | ICD-10-CM | POA: Diagnosis not present

## 2020-10-19 DIAGNOSIS — N2581 Secondary hyperparathyroidism of renal origin: Secondary | ICD-10-CM | POA: Diagnosis not present

## 2020-10-19 DIAGNOSIS — D509 Iron deficiency anemia, unspecified: Secondary | ICD-10-CM | POA: Diagnosis not present

## 2020-10-19 DIAGNOSIS — N186 End stage renal disease: Secondary | ICD-10-CM | POA: Diagnosis not present

## 2020-10-20 DIAGNOSIS — Z992 Dependence on renal dialysis: Secondary | ICD-10-CM | POA: Diagnosis not present

## 2020-10-20 DIAGNOSIS — E1129 Type 2 diabetes mellitus with other diabetic kidney complication: Secondary | ICD-10-CM | POA: Diagnosis not present

## 2020-10-20 DIAGNOSIS — N186 End stage renal disease: Secondary | ICD-10-CM | POA: Diagnosis not present

## 2020-10-22 DIAGNOSIS — D509 Iron deficiency anemia, unspecified: Secondary | ICD-10-CM | POA: Diagnosis not present

## 2020-10-22 DIAGNOSIS — D631 Anemia in chronic kidney disease: Secondary | ICD-10-CM | POA: Diagnosis not present

## 2020-10-22 DIAGNOSIS — N2581 Secondary hyperparathyroidism of renal origin: Secondary | ICD-10-CM | POA: Diagnosis not present

## 2020-10-22 DIAGNOSIS — N186 End stage renal disease: Secondary | ICD-10-CM | POA: Diagnosis not present

## 2020-10-22 DIAGNOSIS — Z992 Dependence on renal dialysis: Secondary | ICD-10-CM | POA: Diagnosis not present

## 2020-10-23 DIAGNOSIS — E7849 Other hyperlipidemia: Secondary | ICD-10-CM | POA: Diagnosis not present

## 2020-10-23 DIAGNOSIS — I2510 Atherosclerotic heart disease of native coronary artery without angina pectoris: Secondary | ICD-10-CM | POA: Diagnosis not present

## 2020-10-23 DIAGNOSIS — Z7189 Other specified counseling: Secondary | ICD-10-CM | POA: Diagnosis not present

## 2020-10-23 DIAGNOSIS — N186 End stage renal disease: Secondary | ICD-10-CM | POA: Diagnosis not present

## 2020-10-23 DIAGNOSIS — E1122 Type 2 diabetes mellitus with diabetic chronic kidney disease: Secondary | ICD-10-CM | POA: Diagnosis not present

## 2020-10-23 DIAGNOSIS — S98111D Complete traumatic amputation of right great toe, subsequent encounter: Secondary | ICD-10-CM | POA: Diagnosis not present

## 2020-10-23 DIAGNOSIS — I10 Essential (primary) hypertension: Secondary | ICD-10-CM | POA: Diagnosis not present

## 2020-10-31 ENCOUNTER — Telehealth (HOSPITAL_COMMUNITY): Payer: Self-pay

## 2020-10-31 NOTE — Telephone Encounter (Signed)
Pt insurance is active and benefits verified through Medicare A/B. Co-pay $0.00, DED $233.00/$0.00 met, out of pocket $0.00/$0.00 met, co-insurance 20%. No pre-authorization required. Passport, 10/31/20 @ 9:40AM, REF#[id number]  2ndary insurance is active and benefits verified through [employer]. Co-pay $0.00, DED $1,500.00/$214.82 met, out of pocket $5,000.00/$528.42 met, co-insurance 30%. No pre-authorization required. Passport, 10/31/20 @ 9:43AM, REF#[id number]  Will contact patient to see if he is interested in the Cardiac Rehab Program. Patient will be contacted for scheduling upon review by the RN Navigator. 

## 2020-10-31 NOTE — Telephone Encounter (Signed)
Attempted to call patient in regards to Cardiac Rehab - LM on VM 

## 2020-11-01 ENCOUNTER — Encounter (HOSPITAL_COMMUNITY): Payer: Self-pay | Admitting: *Deleted

## 2020-11-01 NOTE — Progress Notes (Signed)
Received referral notification from Dr. Tobe Sos at [facility] for this pt to participate in cardiac rehab Phase II. On 12/28 pt had DES x 3 per patient.  Reviewed medical history in Care Everywhere.  Pt has upcoming appt with Dr. Tobe Sos on 1/25.  Pt with previous cardiac event on 09/21/20 NSTEMI and DES x 1 to OM1 on 09/24/20 here at [facility].  Pt plans to continue his cardiac care with Dr. Tobe Sos at [facility].  Pt with ESRD and has HD on MWF.  Will seek nephrology clearance from Dr. Hollie Salk for this pt who feels he could exercise mid afternoon as he completes dialysis at 11 am. MD referral form along with request for 12 lead ekg faxed to Dr. Tobe Sos office.  Pt must complete his follow up appt on 1/25,requested documents and nephrology clearance for pt to schedule cardiac rehab. Cherre Huger, BSN Cardiac and Training and development officer

## 2020-11-03 ENCOUNTER — Other Ambulatory Visit: Payer: Self-pay

## 2020-11-03 ENCOUNTER — Ambulatory Visit (HOSPITAL_COMMUNITY)
Admission: EM | Admit: 2020-11-03 | Discharge: 2020-11-03 | Disposition: A | Discharge status: Home / Self Care | Payer: Medicare Other

## 2020-11-03 DIAGNOSIS — M25512 Pain in left shoulder: Secondary | ICD-10-CM | POA: Diagnosis not present

## 2020-11-03 DIAGNOSIS — E875 Hyperkalemia: Secondary | ICD-10-CM | POA: Diagnosis not present

## 2020-11-03 DIAGNOSIS — R079 Chest pain, unspecified: Secondary | ICD-10-CM | POA: Diagnosis not present

## 2020-11-03 DIAGNOSIS — S43032A Inferior subluxation of left humerus, initial encounter: Secondary | ICD-10-CM | POA: Diagnosis not present

## 2020-11-03 DIAGNOSIS — R9431 Abnormal electrocardiogram [ECG] [EKG]: Secondary | ICD-10-CM | POA: Diagnosis not present

## 2020-11-03 DIAGNOSIS — I517 Cardiomegaly: Secondary | ICD-10-CM | POA: Diagnosis not present

## 2020-11-08 DIAGNOSIS — L97512 Non-pressure chronic ulcer of other part of right foot with fat layer exposed: Secondary | ICD-10-CM | POA: Diagnosis not present

## 2020-11-15 DIAGNOSIS — I739 Peripheral vascular disease, unspecified: Secondary | ICD-10-CM | POA: Diagnosis not present

## 2020-11-20 DIAGNOSIS — N186 End stage renal disease: Secondary | ICD-10-CM | POA: Diagnosis not present

## 2020-11-20 DIAGNOSIS — Z992 Dependence on renal dialysis: Secondary | ICD-10-CM | POA: Diagnosis not present

## 2020-11-20 DIAGNOSIS — E1129 Type 2 diabetes mellitus with other diabetic kidney complication: Secondary | ICD-10-CM | POA: Diagnosis not present

## 2020-11-21 DIAGNOSIS — N2581 Secondary hyperparathyroidism of renal origin: Secondary | ICD-10-CM | POA: Diagnosis not present

## 2020-11-21 DIAGNOSIS — N186 End stage renal disease: Secondary | ICD-10-CM | POA: Diagnosis not present

## 2020-11-21 DIAGNOSIS — Z992 Dependence on renal dialysis: Secondary | ICD-10-CM | POA: Diagnosis not present

## 2020-11-21 DIAGNOSIS — D631 Anemia in chronic kidney disease: Secondary | ICD-10-CM | POA: Diagnosis not present

## 2020-11-21 DIAGNOSIS — D509 Iron deficiency anemia, unspecified: Secondary | ICD-10-CM | POA: Diagnosis not present

## 2020-11-22 ENCOUNTER — Telehealth (HOSPITAL_COMMUNITY): Payer: Self-pay | Admitting: *Deleted

## 2020-11-22 DIAGNOSIS — L97511 Non-pressure chronic ulcer of other part of right foot limited to breakdown of skin: Secondary | ICD-10-CM | POA: Diagnosis not present

## 2020-11-22 NOTE — Telephone Encounter (Signed)
Cin returned my call. Asked pt to contact his cardiologist Dr. Tobe Sos to see if at all possible to get an earlier appt.  Pt asked could I call and let him know.  Called Dr. Tobe Sos office and was able to get him an appt on March 10th at 10:30. Asked pt to remind the provider at the dialysis clinic on tomorrow to please complete the clearance to participate in exercise on the same days as dialysis.  Re faxed form again today. Cherre Huger, BSN Cardiac and Training and development officer

## 2020-11-22 NOTE — Telephone Encounter (Addendum)
Pt completed his follow up appt with Vascular MD at [facility].  Reviewed progress note in Care Everywhere.  No plans for intervention at this time.  Received ppwk from Dr. Adron Bene, cardiologist at [facility] await clearance from his nephrologist to participate in cardiac rehab on the same days as dialysis MWF amd complete follow up with cardiology.  Pt had DES placed x 3 at [facility] on 12/28.  Pt does not have a follow up appt until 3/22.  Called and left message (first time I called someone picked up then hung up) second phone call requesting call back.  Would like to see if he could contact his Dr. Tobe Sos to see if an earlier appt could be scheduled/completed at a patient preferred time as to not delay his start in cardiac rehab.  Cherre Huger, BSN Cardiac and Training and development officer

## 2020-12-05 DIAGNOSIS — M7552 Bursitis of left shoulder: Secondary | ICD-10-CM | POA: Diagnosis not present

## 2020-12-05 DIAGNOSIS — E11621 Type 2 diabetes mellitus with foot ulcer: Secondary | ICD-10-CM | POA: Diagnosis not present

## 2020-12-05 DIAGNOSIS — M25512 Pain in left shoulder: Secondary | ICD-10-CM | POA: Diagnosis not present

## 2020-12-05 DIAGNOSIS — E1159 Type 2 diabetes mellitus with other circulatory complications: Secondary | ICD-10-CM | POA: Diagnosis not present

## 2020-12-05 DIAGNOSIS — M25511 Pain in right shoulder: Secondary | ICD-10-CM | POA: Diagnosis not present

## 2020-12-05 DIAGNOSIS — S90425A Blister (nonthermal), left lesser toe(s), initial encounter: Secondary | ICD-10-CM | POA: Diagnosis not present

## 2020-12-06 DIAGNOSIS — T82858A Stenosis of vascular prosthetic devices, implants and grafts, initial encounter: Secondary | ICD-10-CM | POA: Diagnosis not present

## 2020-12-06 DIAGNOSIS — I871 Compression of vein: Secondary | ICD-10-CM | POA: Diagnosis not present

## 2020-12-06 DIAGNOSIS — Z992 Dependence on renal dialysis: Secondary | ICD-10-CM | POA: Diagnosis not present

## 2020-12-06 DIAGNOSIS — N186 End stage renal disease: Secondary | ICD-10-CM | POA: Diagnosis not present

## 2020-12-11 ENCOUNTER — Encounter: Payer: Self-pay | Admitting: Vascular Surgery

## 2020-12-11 ENCOUNTER — Other Ambulatory Visit: Payer: Self-pay

## 2020-12-11 ENCOUNTER — Ambulatory Visit (INDEPENDENT_AMBULATORY_CARE_PROVIDER_SITE_OTHER): Payer: Medicare Other | Admitting: Vascular Surgery

## 2020-12-11 VITALS — BP 219/105 | HR 65 | Temp 97.9°F | Resp 20 | Ht 70.0 in | Wt 198.0 lb

## 2020-12-11 DIAGNOSIS — N186 End stage renal disease: Secondary | ICD-10-CM

## 2020-12-11 NOTE — H&P (View-Only) (Signed)
ASSESSMENT & PLAN:  55 y.o. male with hypopigmentation of right upper extremity arteriovenous brachiocephalic fistula with graft interposition.  The patient is quite concerned about this area, having a acquaintance to died from bleeding from an ulcerated AV fistula.  He would like to proceed with excision of this area.  I counseled him that I would try to excise the skin only, leaving the fistula undisturbed.  I explained that there is always a risk that I would have to revise the fistula itself along with the skin which could require placement of a tunneled dialysis catheter temporarily.  He is understanding and wishes to proceed.  We will plan to do this on the next available nondialysis day, Thursday, 12/20/2020.  CHIEF COMPLAINT:   Thin area of AV fistula  HISTORY:  HISTORY OF PRESENT ILLNESS: [name] is a 55 y.o. male with end-stage renal disease dialyzing Monday, Wednesday, Friday through a right upper extremity fistula/graft hybrid placed in February 2014 by Dr. Bridgett Larsson.  He has developed a area of hyperpigmentation in the mid aspect of the access.  He reports prolonged bleeding when this area was being cannulated.  He does not let the dialysis center cannulate him here anymore.  He is quite concerned because a acquaintance at his dialysis center died from bleeding from an ulcerated AV fistula.  He desires AV revision  Past Medical History:  Diagnosis Date  . Abscess of right foot   . Acute osteomyelitis of right foot ([place]) 05/15/2019  . Anemia 06/23/2019  . Cellulitis of right foot 05/22/2019  . Chest pain 08/23/2013  . Chills    at night - sometimes  . Complication of anesthesia   . Diabetes mellitus    controlled by diet  . 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 Diabetic foot ulcer- right second toe 05/14/2019  . Diabetic infection of right foot ([place])   . Diabetic retinopathy associated with type 2 diabetes mellitus ([place]) 06/23/2019  . Eczema   . ESRD on  hemodialysis ([place]) 07/18/2011  . Essential hypertension 10/18/2007   Qualifier: Diagnosis of  By: Loanne Drilling MD, Jacelyn Pi   . Fatigue    loss of fatigue  . FOOT ULCER, LEFT 06/29/2008   Qualifier: Diagnosis of  By: Loanne Drilling MD, Sean A   . Gangrene of toe of right foot ([place])   . Headache(784.0)    Years ago  . Heart murmur    years ago  . History of blood transfusion   . Hyperglycemia 05/05/2015  . HYPERLIPIDEMIA 05/16/2008   Qualifier: Diagnosis of  By: Jenny Reichmann MD, Hunt Oris   . Hypoglycemia, unspecified 11/13/2008   Qualifier: Diagnosis of  By: Jenny Reichmann MD, Hunt Oris   . MRSA (methicillin resistant staph aureus) culture positive   . MRSA INFECTION 10/18/2007   Qualifier: Diagnosis of  By: Marca Ancona RMA, Lucy    . Obesity, unspecified 08/23/2013  . Osteomyelitis of great toe of right foot ([place]) 05/14/2019  . Other forms of retinal detachment(361.89) 06/23/2019  . PONV (postoperative nausea and vomiting)   . Poor circulation   . Secondary hyperparathyroidism ([place]) 06/23/2019  . Sepsis ([place]) 05/14/2019  . Type 2 diabetes mellitus ([place]) 08/23/2013  . Ulcer of right foot, with necrosis of bone ([place])   . Ulcer of right second toe with necrosis of bone [facility])     Past Surgical History:  Procedure Laterality Date  . ABDOMINAL AORTOGRAM N/A 05/17/2019   Procedure: ABDOMINAL AORTOGRAM;  Surgeon: Elam Dutch, MD;  [facility]: [facility] INV[facility];  Service: Cardiovascular;  Laterality: N/A;  . AMPUTATION TOE Right 05/14/2019   Procedure: PARTIAL AMPUTATION SECOND TOE RIGHT FOOT;  Surgeon: Price, Michael J, DPM;  Location: [facility] OR;  Service: Podiatry;  Laterality: Right;  . AMPUTATION TOE Right 05/23/2019   Procedure: AMPUTATION OF SECOND TOE METATARSAL PHALANGEAL JOINT;  Surgeon: Price, Michael J, DPM;  Location: [facility] OR;  Service: Podiatry;  Laterality: Right;  . AMPUTATION TOE Right 05/27/2019   Procedure: Amputation Toe;  Surgeon: Price, Michael J, DPM;  Location: [facility] OR;  Service: Podiatry;  Laterality: Right;  right third  toe  . APPLICATION OF WOUND VAC Right 05/27/2019   Procedure: Application Of Wound Vac;  Surgeon: Price, Michael J, DPM;  Location: [facility] OR;  Service: Podiatry;  Laterality: Right;  . AV FISTULA PLACEMENT  06-18-11   Right brachiocephalic AVF  . BONE BIOPSY Right 05/14/2019   Procedure: OPEN SUPERFICIAL BONE BIOPSY GREAT TOE;  Surgeon: Price, Michael J, DPM;  Location: [facility];  Service: Podiatry;  Laterality: Right;  . CORONARY BALLOON ANGIOPLASTY N/A 09/24/2020   Procedure: CORONARY BALLOON ANGIOPLASTY;  Surgeon: Jordan, Peter M, MD;  [facility];  Service: Cardiovascular;  Laterality: N/A;  . CORONARY STENT INTERVENTION N/A 09/24/2020   Procedure: CORONARY STENT INTERVENTION;  Surgeon: Jordan, Peter M, MD;  [facility];  Service: Cardiovascular;  Laterality: N/A;  . EYE SURGERY     left eye for Laser, diabetic retinopathy  . FISTULOGRAM Right 11/06/2011   Procedure: FISTULOGRAM;  Surgeon: Brian L Chen, MD;  Location: [facility] CATH [facility];  Service: Cardiovascular;  Laterality: Right;  . HEMATOMA EVACUATION Right Feb. 25, 2014  . HEMATOMA EVACUATION Right 12/14/2012   Procedure: EVACUATION HEMATOMA;  Surgeon: Brian L Chen, MD;  [facility]: [facility];  Service: Vascular;  Laterality: Right;  . INCISION AND DRAINAGE Right 05/23/2019   Procedure: INCISION AND DRAINAGE OF RIGHT FOOT DEEP SPACE ABSCESS;  Surgeon: Price, Michael J, DPM;  [facility];  Service: Podiatry;  Laterality: Right;  . INSERTION OF DIALYSIS CATHETER  12/14/2012   Procedure: INSERTION OF DIALYSIS CATHETER;  Surgeon: Brian L Chen, MD;  [facility]: [facility];  Service: Vascular;;  . INTRAVASCULAR PRESSURE WIRE/FFR STUDY N/A 09/24/2020   Procedure: INTRAVASCULAR PRESSURE WIRE/FFR STUDY;  Surgeon: Jordan, Peter M, MD;  [facility];  Service: Cardiovascular;  Laterality: N/A;  . IRRIGATION AND DEBRIDEMENT FOOT Right 05/25/2019   Procedure: INCISION AND DRAINAGE FOOT;  Surgeon: Price, Michael J, DPM;  Location:  [facility];  Service: Podiatry;  Laterality: Right;  . IRRIGATION AND DEBRIDEMENT FOOT Right 05/27/2019   Procedure: IRRIGATION AND DEBRIDEMENT FOOT;  Surgeon: Price, Michael J, DPM;  [facility];  Service: Podiatry;  Laterality: Right;  . LEFT HEART CATH AND CORONARY ANGIOGRAPHY N/A 09/24/2020   Procedure: LEFT HEART CATH AND CORONARY ANGIOGRAPHY;  Surgeon: Jordan, Peter M, MD;  [facility];  Service: Cardiovascular;  Laterality: N/A;  . LOWER EXTREMITY ANGIOGRAPHY Bilateral 05/17/2019   Procedure: LOWER EXTREMITY ANGIOGRAPHY;  Surgeon: Fields, Charles E, MD;  [facility];  Service: Cardiovascular;  Laterality: Bilateral;  . REVISON OF ARTERIOVENOUS FISTULA Right 12/14/2012   Procedure: REVISON OF upper arm ARTERIOVENOUS FISTULA using 6mmx10cm gortex graft;  Surgeon: Brian L Chen, MD;  [facility];  Service: Vascular;  Laterality: Right;  . RIGHT/LEFT HEART CATH AND CORONARY ANGIOGRAPHY N/A 08/07/2020   Procedure: RIGHT/LEFT HEART CATH AND CORONARY ANGIOGRAPHY;    Surgeon: Jordan, Peter M, MD;  [facility];  Service: Cardiovascular;  Laterality: N/A;  . SOFT TISSUE MASS EXCISION     Right arm, Left leg  for MRSA infection    Family History  Problem Relation Age of Onset  . Diabetes Mother     Social History   Socioeconomic History  . Marital status: Married    Spouse name: Not on file  . Number of children: 2  . Years of education: Not on file  . Highest education level: Not on file  Occupational History  . Not on file  Tobacco Use  . Smoking status: Never Smoker  . Smokeless tobacco: Never Used  Vaping Use  . Vaping Use: Never used  Substance and Sexual Activity  . Alcohol use: No  . Drug use: No  . Sexual activity: Yes  Other Topics Concern  . Not on file  Social History Narrative  . Not on file   Social Determinants of Health   Financial Resource Strain: Not on file  Food Insecurity: Not on file  Transportation Needs:  Not on file  Physical Activity: Not on file  Stress: Not on file  Social Connections: Not on file  Intimate Partner Violence: Not on file    Allergies  Allergen Reactions  . Atorvastatin     Other reaction(s): Muscle Pain  . Rosuvastatin     Other reaction(s): Muscle Pain  . Amoxicillin Itching    Did it involve swelling of the face/tongue/throat, SOB, or low BP? Unknown Did it involve sudden or severe rash/hives, skin peeling, or any reaction on the inside of your mouth or nose? Unknown Did you need to seek medical attention at a hospital or doctor's office? Unknown When did it last happen?20 years ago If all above answers are "NO", may proceed with cephalosporin use.   . Hydromorphone Itching    Patient states he may take with benadryl. Makes feet itch.  . Percocet [Oxycodone-Acetaminophen] Itching and Other (See Comments)    Legs only. Itching and burning feeling.  . Chlorhexidine Hives    Patient has blistering  . Gabapentin Nausea And Vomiting    Stomach issues     Current Outpatient Medications  Medication Sig Dispense Refill  . amLODipine (NORVASC) 10 MG tablet Take 10 mg by mouth at bedtime.     . aspirin EC 81 MG tablet Take 1 tablet (81 mg total) by mouth daily. 90 tablet 3  . blood glucose meter kit and supplies Dispense based on patient and insurance preference. Use up to four times daily as directed. (FOR ICD-10 E10.9, E11.9). 1 each 0  . blood glucose meter kit and supplies Dispense based on patient and insurance preference. Use up to four times daily as directed. (FOR ICD-9 250.00, 250.01). 1 each 0  . Continuous Blood Gluc Sensor (FREESTYLE LIBRE 14 DAY SENSOR) MISC See admin instructions.    . glucose blood (ACCU-CHEK AVIVA PLUS) test strip Use as instructed 100 each 1  . hydrALAZINE (APRESOLINE) 25 MG tablet Take 1 tablet (25 mg total) by mouth every 8 (eight) hours. Do not take morning of dialysis. (Patient taking differently: Take 50 mg by mouth every 8  (eight) hours. Do not take morning of dialysis.) 90 tablet 0  . isosorbide dinitrate (ISORDIL) 20 MG tablet Take 20 mg by mouth 3 (three) times daily.    . Lancet Devices (ACCU-CHEK SOFTCLIX) lancets Use as instructed 100 each 5  . OVER THE COUNTER MEDICATION Take   2 capsules by mouth 2 (two) times daily. Zymessence    . propranolol (INDERAL) 80 MG tablet Take 1 tablet (80 mg total) by mouth 2 (two) times daily. 60 tablet 1  . sevelamer carbonate (RENVELA) 800 MG tablet Take 800 mg by mouth 3 (three) times daily with meals. Just takes with meals    . ticagrelor (BRILINTA) 90 MG TABS tablet Take 1 tablet (90 mg total) by mouth 2 (two) times daily. 60 tablet 2  . ezetimibe (ZETIA) 10 MG tablet Take 10 mg by mouth daily. (Patient not taking: Reported on 12/11/2020)     No current facility-administered medications for this visit.    REVIEW OF SYSTEMS:  [X] denotes positive finding, [ ] denotes negative finding Cardiac  Comments:  Chest pain or chest pressure:    Shortness of breath upon exertion:    Short of breath when lying flat:    Irregular heart rhythm:        Vascular    Pain in calf, thigh, or hip brought on by ambulation:    Pain in feet at night that wakes you up from your sleep:     Blood clot in your veins:    Leg swelling:         Pulmonary    Oxygen at home:    Productive cough:     Wheezing:         Neurologic    Sudden weakness in arms or legs:     Sudden numbness in arms or legs:     Sudden onset of difficulty speaking or slurred speech:    Temporary loss of vision in one eye:     Problems with dizziness:         Gastrointestinal    Blood in stool:     Vomited blood:         Genitourinary    Burning when urinating:     Blood in urine:        Psychiatric    Major depression:         Hematologic    Bleeding problems:    Problems with blood clotting too easily:        Skin    Rashes or ulcers:        Constitutional    Fever or chills:     PHYSICAL  EXAM:   Vitals:   12/11/20 1538  BP: (!) 219/105  Pulse: 65  Resp: 20  Temp: 97.9 F (36.6 C)  SpO2: 100%  Weight: 198 lb (89.8 kg)  Height: 5' 10" (1.778 m)   Constitutional: well appearing in no distress. Appears well nourished.  Neurologic: CN intact. no focal findings. no sensory loss. Psychiatric: Mood and affect symmetric and appropriate. Eyes: No icterus. No conjunctival pallor. Ears, nose, throat: mucous membranes moist. Midline trachea.  Cardiac: regular rate and rhythm.  Respiratory: unlabored. Abdominal: soft, non-tender, non-distended.  Peripheral vascular:  1 sq cm area of hypopigmentation about RUE AV access  Normal thrill in access Extremity: No edema. No cyanosis. No pallor.  Skin: No gangrene. No ulceration.  Lymphatic: No Stemmer's sign. No palpable lymphadenopathy.   DATA REVIEW:    Most recent CBC CBC Latest Ref Rng & Units 09/25/2020 09/24/2020 09/23/2020  WBC 4.0 - 10.5 K/uL 4.0 4.8 4.3  Hemoglobin 13.0 - 17.0 g/dL 10.3(L) 10.7(L) 11.2(L)  Hematocrit 39.0 - 52.0 % 31.3(L) 32.1(L) 34.5(L)  Platelets 150 - 400 K/uL 152 149(L) 188     Most recent CMP   CMP Latest Ref Rng & Units 09/25/2020 09/24/2020 09/22/2020  Glucose 70 - 99 mg/dL 161(H) 100(H) 135(H)  BUN 6 - 20 mg/dL 85(H) 67(H) 25(H)  Creatinine 0.61 - 1.24 mg/dL 11.94(H) 10.31(H) 6.72(H)  Sodium 135 - 145 mmol/L 138 137 139  Potassium 3.5 - 5.1 mmol/L 5.8(H) 5.6(H) 4.6  Chloride 98 - 111 mmol/L 93(L) 92(L) 93(L)  CO2 22 - 32 mmol/L 25 26 29  Calcium 8.9 - 10.3 mg/dL 9.1 9.1 9.7  Total Protein 6.5 - 8.1 g/dL - - -  Total Bilirubin 0.3 - 1.2 mg/dL - - -  Alkaline Phos 38 - 126 U/L - - -  AST 15 - 41 U/L - - -  ALT 0 - 44 U/L - - -    Renal function CrCl cannot be calculated (Patient's most recent lab result is older than the maximum 21 days allowed.).  Hgb A1c MFr Bld (%)  Date Value  09/22/2020 7.4 (H)    LDL Cholesterol  Date Value Ref Range Status  09/22/2020 48 0 - 99 mg/dL Final     Comment:           Total Cholesterol/HDL:CHD Risk Coronary Heart Disease Risk Table                     Men   Women  1/2 Average Risk   3.4   3.3  Average Risk       5.0   4.4  2 X Average Risk   9.6   7.1  3 X Average Risk  23.4   11.0        Use the calculated Patient Ratio above and the CHD Risk Table to determine the patient's CHD Risk.        ATP III CLASSIFICATION (LDL):  <100     mg/dL   Optimal  100-129  mg/dL   Near or Above                    Optimal  130-159  mg/dL   Borderline  160-189  mg/dL   High  >190     mg/dL   Very High Performed at [facility], [address] [place], [place] [address]    Direct LDL  Date Value Ref Range Status  11/13/2008 166.1 mg/dL Final    Comment:    See lab report for associated comment(s)     Yaire Kreher N. Lucia Mccreadie, MD [facility] Office Phone Number: [phone] 12/11/2020 5:09 PM    

## 2020-12-11 NOTE — Progress Notes (Signed)
ASSESSMENT & PLAN:  55 y.o. male with hypopigmentation of right upper extremity arteriovenous brachiocephalic fistula with graft interposition.  The patient is quite concerned about this area, having a acquaintance to died from bleeding from an ulcerated AV fistula.  He would like to proceed with excision of this area.  I counseled him that I would try to excise the skin only, leaving the fistula undisturbed.  I explained that there is always a risk that I would have to revise the fistula itself along with the skin which could require placement of a tunneled dialysis catheter temporarily.  He is understanding and wishes to proceed.  We will plan to do this on the next available nondialysis day, Thursday, 12/20/2020.  CHIEF COMPLAINT:   Thin area of AV fistula  HISTORY:  HISTORY OF PRESENT ILLNESS: [name] is a 55 y.o. male with end-stage renal disease dialyzing Monday, Wednesday, Friday through a right upper extremity fistula/graft hybrid placed in February 2014 by Dr. Bridgett Larsson.  He has developed a area of hyperpigmentation in the mid aspect of the access.  He reports prolonged bleeding when this area was being cannulated.  He does not let the dialysis center cannulate him here anymore.  He is quite concerned because a acquaintance at his dialysis center died from bleeding from an ulcerated AV fistula.  He desires AV revision  Past Medical History:  Diagnosis Date  . Abscess of right foot   . Acute osteomyelitis of right foot ([place]) 05/15/2019  . Anemia 06/23/2019  . Cellulitis of right foot 05/22/2019  . Chest pain 08/23/2013  . Chills    at night - sometimes  . Complication of anesthesia   . Diabetes mellitus    controlled by diet  . 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 Diabetic foot ulcer- right second toe 05/14/2019  . Diabetic infection of right foot ([place])   . Diabetic retinopathy associated with type 2 diabetes mellitus ([place]) 06/23/2019  . Eczema   . ESRD on  hemodialysis ([place]) 07/18/2011  . Essential hypertension 10/18/2007   Qualifier: Diagnosis of  By: Loanne Drilling MD, Jacelyn Pi   . Fatigue    loss of fatigue  . FOOT ULCER, LEFT 06/29/2008   Qualifier: Diagnosis of  By: Loanne Drilling MD, Sean A   . Gangrene of toe of right foot ([place])   . Headache(784.0)    Years ago  . Heart murmur    years ago  . History of blood transfusion   . Hyperglycemia 05/05/2015  . HYPERLIPIDEMIA 05/16/2008   Qualifier: Diagnosis of  By: Jenny Reichmann MD, Hunt Oris   . Hypoglycemia, unspecified 11/13/2008   Qualifier: Diagnosis of  By: Jenny Reichmann MD, Hunt Oris   . MRSA (methicillin resistant staph aureus) culture positive   . MRSA INFECTION 10/18/2007   Qualifier: Diagnosis of  By: Marca Ancona RMA, Lucy    . Obesity, unspecified 08/23/2013  . Osteomyelitis of great toe of right foot ([place]) 05/14/2019  . Other forms of retinal detachment(361.89) 06/23/2019  . PONV (postoperative nausea and vomiting)   . Poor circulation   . Secondary hyperparathyroidism ([place]) 06/23/2019  . Sepsis ([place]) 05/14/2019  . Type 2 diabetes mellitus ([place]) 08/23/2013  . Ulcer of right foot, with necrosis of bone ([place])   . Ulcer of right second toe with necrosis of bone [facility])     Past Surgical History:  Procedure Laterality Date  . ABDOMINAL AORTOGRAM N/A 05/17/2019   Procedure: ABDOMINAL AORTOGRAM;  Surgeon: Elam Dutch, MD;  Location: [place] CV [facility];  Service: Cardiovascular;  Laterality: N/A;  . AMPUTATION TOE Right 05/14/2019   Procedure: PARTIAL AMPUTATION SECOND TOE RIGHT FOOT;  Surgeon: Evelina Bucy, DPM;  Location: [place];  Service: Podiatry;  Laterality: Right;  . AMPUTATION TOE Right 05/23/2019   Procedure: AMPUTATION OF SECOND TOE METATARSAL PHALANGEAL JOINT;  Surgeon: Evelina Bucy, DPM;  Location: [place];  Service: Podiatry;  Laterality: Right;  . AMPUTATION TOE Right 05/27/2019   Procedure: Amputation Toe;  Surgeon: Evelina Bucy, DPM;  Location: [place];  Service: Podiatry;  Laterality: Right;  right third  toe  . APPLICATION OF WOUND VAC Right 05/27/2019   Procedure: Application Of Wound Vac;  Surgeon: Evelina Bucy, DPM;  Location: [place];  Service: Podiatry;  Laterality: Right;  . AV FISTULA PLACEMENT  06-18-11   Right brachiocephalic AVF  . BONE BIOPSY Right 05/14/2019   Procedure: OPEN SUPERFICIAL BONE BIOPSY GREAT TOE;  Surgeon: Evelina Bucy, DPM;  Location: [place];  Service: Podiatry;  Laterality: Right;  . CORONARY BALLOON ANGIOPLASTY N/A 09/24/2020   Procedure: CORONARY BALLOON ANGIOPLASTY;  Surgeon: Martinique, Peter M, MD;  Location: [place] [facility];  Service: Cardiovascular;  Laterality: N/A;  . CORONARY STENT INTERVENTION N/A 09/24/2020   Procedure: CORONARY STENT INTERVENTION;  Surgeon: Martinique, Peter M, MD;  Location: [place] CV LAB;  Service: Cardiovascular;  Laterality: N/A;  . EYE SURGERY     left eye for Laser, diabetic retinopathy  . FISTULOGRAM Right 11/06/2011   Procedure: FISTULOGRAM;  Surgeon: Conrad Lula, MD;  Location[facility];  Service: Cardiovascular;  Laterality: Right;  . HEMATOMA EVACUATION Right Feb. 25, 2014  . HEMATOMA EVACUATION Right 12/14/2012   Procedure: EVACUATION HEMATOMA;  Surgeon: Conrad [place], MD;  Location: [place];  Service: Vascular;  Laterality: Right;  . INCISION AND DRAINAGE Right 05/23/2019   Procedure: INCISION AND DRAINAGE OF RIGHT FOOT DEEP SPACE ABSCESS;  Surgeon: Evelina Bucy, DPM;  Location: [place];  Service: Podiatry;  Laterality: Right;  . INSERTION OF DIALYSIS CATHETER  12/14/2012   Procedure: INSERTION OF DIALYSIS CATHETER;  Surgeon: Conrad Logan, MD;  Location: [place];  Service: Vascular;;  . INTRAVASCULAR PRESSURE WIRE/FFR STUDY N/A 09/24/2020   Procedure: INTRAVASCULAR PRESSURE WIRE/FFR STUDY;  Surgeon: Martinique, Peter M, MD;  Location: [place] CV [facility];  Service: Cardiovascular;  Laterality: N/A;  . IRRIGATION AND DEBRIDEMENT FOOT Right 05/25/2019   Procedure: INCISION AND DRAINAGE FOOT;  Surgeon: Evelina Bucy, DPM;  Location:  [place];  Service: Podiatry;  Laterality: Right;  . IRRIGATION AND DEBRIDEMENT FOOT Right 05/27/2019   Procedure: IRRIGATION AND DEBRIDEMENT FOOT;  Surgeon: Evelina Bucy, DPM;  Location: [place];  Service: Podiatry;  Laterality: Right;  . LEFT HEART CATH AND CORONARY ANGIOGRAPHY N/A 09/24/2020   Procedure: LEFT HEART CATH AND CORONARY ANGIOGRAPHY;  Surgeon: Martinique, Peter M, MD;  Location: [place] CV LAB;  Service: Cardiovascular;  Laterality: N/A;  . LOWER EXTREMITY ANGIOGRAPHY Bilateral 05/17/2019   Procedure: LOWER EXTREMITY ANGIOGRAPHY;  Surgeon: Elam Dutch, MD;  Location: [place] CV LAB;  Service: Cardiovascular;  Laterality: Bilateral;  . REVISON OF ARTERIOVENOUS FISTULA Right 12/14/2012   Procedure: REVISON OF upper arm ARTERIOVENOUS FISTULA using 95mx10cm gortex graft;  Surgeon: BConrad [place] MD;  Location: [place]  Service: Vascular;  Laterality: Right;  . RIGHT/LEFT HEART CATH AND CORONARY ANGIOGRAPHY N/A 08/07/2020   Procedure: RIGHT/LEFT HEART CATH AND CORONARY ANGIOGRAPHY;  Surgeon: Martinique, Peter M, MD;  Location: [place] [facility] LAB;  Service: Cardiovascular;  Laterality: N/A;  . SOFT TISSUE MASS EXCISION     Right arm, Left leg  for MRSA infection    Family History  Problem Relation Age of Onset  . Diabetes Mother     Social History   Socioeconomic History  . Marital status: Married    Spouse name: Not on file  . Number of children: 2  . Years of education: Not on file  . Highest education level: Not on file  Occupational History  . Not on file  Tobacco Use  . Smoking status: Never Smoker  . Smokeless tobacco: Never Used  Vaping Use  . Vaping Use: Never used  Substance and Sexual Activity  . Alcohol use: No  . Drug use: No  . Sexual activity: Yes  Other Topics Concern  . Not on file  Social History Narrative  . Not on file   Social Determinants of Health   Financial Resource Strain: Not on file  Food Insecurity: Not on file  Transportation Needs:  Not on file  Physical Activity: Not on file  Stress: Not on file  Social Connections: Not on file  Intimate Partner Violence: Not on file    Allergies  Allergen Reactions  . Atorvastatin     Other reaction(s): Muscle Pain  . Rosuvastatin     Other reaction(s): Muscle Pain  . Amoxicillin Itching    Did it involve swelling of the face/tongue/throat, SOB, or low BP? Unknown Did it involve sudden or severe rash/hives, skin peeling, or any reaction on the inside of your mouth or nose? Unknown Did you need to seek medical attention at a hospital or doctor's office? Unknown When did it last happen?20 years ago If all above answers are "NO", may proceed with cephalosporin use.   Marland Kitchen Hydromorphone Itching    Patient states he may take with benadryl. Makes feet itch.  Marland Kitchen Percocet [Oxycodone-Acetaminophen] Itching and Other (See Comments)    Legs only. Itching and burning feeling.  . Chlorhexidine Hives    Patient has blistering  . Gabapentin Nausea And Vomiting    Stomach issues     Current Outpatient Medications  Medication Sig Dispense Refill  . amLODipine (NORVASC) 10 MG tablet Take 10 mg by mouth at bedtime.     Marland Kitchen aspirin EC 81 MG tablet Take 1 tablet (81 mg total) by mouth daily. 90 tablet 3  . blood glucose meter kit and supplies Dispense based on patient and insurance preference. Use up to four times daily as directed. (FOR ICD-10 E10.9, E11.9). 1 each 0  . blood glucose meter kit and supplies Dispense based on patient and insurance preference. Use up to four times daily as directed. (FOR ICD-9 250.00, 250.01). 1 each 0  . Continuous Blood Gluc Sensor (FREESTYLE LIBRE 14 DAY SENSOR) MISC See admin instructions.    Marland Kitchen glucose blood (ACCU-CHEK AVIVA PLUS) test strip Use as instructed 100 each 1  . hydrALAZINE (APRESOLINE) 25 MG tablet Take 1 tablet (25 mg total) by mouth every 8 (eight) hours. Do not take morning of dialysis. (Patient taking differently: Take 50 mg by mouth every 8  (eight) hours. Do not take morning of dialysis.) 90 tablet 0  . isosorbide dinitrate (ISORDIL) 20 MG tablet Take 20 mg by mouth 3 (three) times daily.    Elmore Guise Devices (ACCU-CHEK SOFTCLIX) lancets Use as instructed 100 each 5  . OVER THE COUNTER MEDICATION Take  2 capsules by mouth 2 (two) times daily. Zymessence    . propranolol (INDERAL) 80 MG tablet Take 1 tablet (80 mg total) by mouth 2 (two) times daily. 60 tablet 1  . sevelamer carbonate (RENVELA) 800 MG tablet Take 800 mg by mouth 3 (three) times daily with meals. Just takes with meals    . ticagrelor (BRILINTA) 90 MG TABS tablet Take 1 tablet (90 mg total) by mouth 2 (two) times daily. 60 tablet 2  . ezetimibe (ZETIA) 10 MG tablet Take 10 mg by mouth daily. (Patient not taking: Reported on 12/11/2020)     No current facility-administered medications for this visit.    REVIEW OF SYSTEMS:  [X] denotes positive finding, [ ] denotes negative finding Cardiac  Comments:  Chest pain or chest pressure:    Shortness of breath upon exertion:    Short of breath when lying flat:    Irregular heart rhythm:        Vascular    Pain in calf, thigh, or hip brought on by ambulation:    Pain in feet at night that wakes you up from your sleep:     Blood clot in your veins:    Leg swelling:         Pulmonary    Oxygen at home:    Productive cough:     Wheezing:         Neurologic    Sudden weakness in arms or legs:     Sudden numbness in arms or legs:     Sudden onset of difficulty speaking or slurred speech:    Temporary loss of vision in one eye:     Problems with dizziness:         Gastrointestinal    Blood in stool:     Vomited blood:         Genitourinary    Burning when urinating:     Blood in urine:        Psychiatric    Major depression:         Hematologic    Bleeding problems:    Problems with blood clotting too easily:        Skin    Rashes or ulcers:        Constitutional    Fever or chills:     PHYSICAL  EXAM:   Vitals:   12/11/20 1538  BP: (!) 219/105  Pulse: 65  Resp: 20  Temp: 97.9 F (36.6 C)  SpO2: 100%  Weight: 198 lb (89.8 kg)  Height: 5' 10" (1.778 m)   Constitutional: well appearing in no distress. Appears well nourished.  Neurologic: CN intact. no focal findings. no sensory loss. Psychiatric: Mood and affect symmetric and appropriate. Eyes: No icterus. No conjunctival pallor. Ears, nose, throat: mucous membranes moist. Midline trachea.  Cardiac: regular rate and rhythm.  Respiratory: unlabored. Abdominal: soft, non-tender, non-distended.  Peripheral vascular:  1 sq cm area of hypopigmentation about RUE AV access  Normal thrill in access Extremity: No edema. No cyanosis. No pallor.  Skin: No gangrene. No ulceration.  Lymphatic: No Stemmer's sign. No palpable lymphadenopathy.   DATA REVIEW:    Most recent CBC CBC Latest Ref Rng & Units 09/25/2020 09/24/2020 09/23/2020  WBC 4.0 - 10.5 K/uL 4.0 4.8 4.3  Hemoglobin 13.0 - 17.0 g/dL 10.3(L) 10.7(L) 11.2(L)  Hematocrit 39.0 - 52.0 % 31.3(L) 32.1(L) 34.5(L)  Platelets 150 - 400 K/uL 152 149(L) 188     Most recent CMP  CMP Latest Ref Rng & Units 09/25/2020 09/24/2020 09/22/2020  Glucose 70 - 99 mg/dL 161(H) 100(H) 135(H)  BUN 6 - 20 mg/dL 85(H) 67(H) 25(H)  Creatinine 0.61 - 1.24 mg/dL 11.94(H) 10.31(H) 6.72(H)  Sodium 135 - 145 mmol/L 138 137 139  Potassium 3.5 - 5.1 mmol/L 5.8(H) 5.6(H) 4.6  Chloride 98 - 111 mmol/L 93(L) 92(L) 93(L)  CO2 22 - 32 mmol/L _0 Calcium 8.9 - 10.3 mg/dL 9.1 9.1 9.7  Total Protein 6.5 - 8.1 g/dL - - -  Total Bilirubin 0.3 - 1.2 mg/dL - - -  Alkaline Phos 38 - 126 U/L - - -  AST 15 - 41 U/L - - -  ALT 0 - 44 U/L - - -    Renal function CrCl cannot be calculated (Patient's most recent lab result is older than the maximum 21 days allowed.).  Hgb A1c MFr Bld (%)  Date Value  09/22/2020 7.4 (H)    LDL Cholesterol  Date Value Ref Range Status  09/22/2020 48 0 - 99 mg/dL Final     Comment:           Total Cholesterol/HDL:CHD Risk Coronary Heart Disease Risk Table                     Men   Women  1/2 Average Risk   3.4   3.3  Average Risk       5.0   4.4  2 X Average Risk   9.6   7.1  3 X Average Risk  23.4   11.0        Use the calculated Patient Ratio above and the CHD Risk Table to determine the patient's CHD Risk.        ATP III CLASSIFICATION (LDL):  <100     mg/dL   Optimal  100-129  mg/dL   Near or Above                    Optimal  130-159  mg/dL   Borderline  160-189  mg/dL   High  >190     mg/dL   Very High Performed at [facility] [address] [place], [place] [address]    Direct LDL  Date Value Ref Range Status  11/13/2008 166.1 mg/dL Final    Comment:    See lab report for associated comment(s)     [name], MD [facility] Phone Number: [phone] 12/11/2020 5:09 PM

## 2020-12-13 DIAGNOSIS — L97512 Non-pressure chronic ulcer of other part of right foot with fat layer exposed: Secondary | ICD-10-CM | POA: Diagnosis not present

## 2020-12-17 ENCOUNTER — Other Ambulatory Visit: Payer: Self-pay

## 2020-12-18 DIAGNOSIS — E1129 Type 2 diabetes mellitus with other diabetic kidney complication: Secondary | ICD-10-CM | POA: Diagnosis not present

## 2020-12-18 DIAGNOSIS — N186 End stage renal disease: Secondary | ICD-10-CM | POA: Diagnosis not present

## 2020-12-18 DIAGNOSIS — Z992 Dependence on renal dialysis: Secondary | ICD-10-CM | POA: Diagnosis not present

## 2020-12-19 DIAGNOSIS — D631 Anemia in chronic kidney disease: Secondary | ICD-10-CM | POA: Diagnosis not present

## 2020-12-19 DIAGNOSIS — N186 End stage renal disease: Secondary | ICD-10-CM | POA: Diagnosis not present

## 2020-12-19 DIAGNOSIS — D509 Iron deficiency anemia, unspecified: Secondary | ICD-10-CM | POA: Diagnosis not present

## 2020-12-19 DIAGNOSIS — N2581 Secondary hyperparathyroidism of renal origin: Secondary | ICD-10-CM | POA: Diagnosis not present

## 2020-12-19 DIAGNOSIS — Z992 Dependence on renal dialysis: Secondary | ICD-10-CM | POA: Diagnosis not present

## 2020-12-20 DIAGNOSIS — L97512 Non-pressure chronic ulcer of other part of right foot with fat layer exposed: Secondary | ICD-10-CM | POA: Diagnosis not present

## 2020-12-20 DIAGNOSIS — I739 Peripheral vascular disease, unspecified: Secondary | ICD-10-CM | POA: Diagnosis not present

## 2020-12-22 ENCOUNTER — Other Ambulatory Visit (HOSPITAL_COMMUNITY): Payer: Medicare Other

## 2020-12-25 ENCOUNTER — Other Ambulatory Visit (HOSPITAL_COMMUNITY)
Admission: RE | Admit: 2020-12-25 | Discharge: 2020-12-25 | Disposition: A | Discharge status: Home / Self Care | Payer: Medicare Other | Source: Ambulatory Visit | Attending: Vascular Surgery | Admitting: Vascular Surgery

## 2020-12-25 DIAGNOSIS — Z01812 Encounter for preprocedural laboratory examination: Secondary | ICD-10-CM | POA: Insufficient documentation

## 2020-12-25 DIAGNOSIS — Z20822 Contact with and (suspected) exposure to covid-19: Secondary | ICD-10-CM | POA: Insufficient documentation

## 2020-12-25 LAB — SARS CORONAVIRUS 2 (TAT 6-24 HRS): SARS Coronavirus 2: NEGATIVE

## 2020-12-26 ENCOUNTER — Encounter (HOSPITAL_COMMUNITY): Payer: Self-pay | Admitting: Vascular Surgery

## 2020-12-26 DIAGNOSIS — E782 Mixed hyperlipidemia: Secondary | ICD-10-CM | POA: Diagnosis not present

## 2020-12-26 DIAGNOSIS — I739 Peripheral vascular disease, unspecified: Secondary | ICD-10-CM | POA: Diagnosis not present

## 2020-12-26 DIAGNOSIS — I10 Essential (primary) hypertension: Secondary | ICD-10-CM | POA: Diagnosis not present

## 2020-12-26 DIAGNOSIS — I2510 Atherosclerotic heart disease of native coronary artery without angina pectoris: Secondary | ICD-10-CM | POA: Diagnosis not present

## 2020-12-26 NOTE — Progress Notes (Signed)
EKG: 09/27/20 CXR: 09/21/20 ECHO:  Stress Test: 09/09/13 Cardiac Cath: 09/24/20  Fasting Blood Sugar- does not check.  Diet controlled Checks Blood Sugar_0__ times a day  OSA/CPAP:  No  ASA:  Continue Blood Thinners:  No  Covid test 12/25/20 negative  Anesthesia Review:  Yes cardiac history with stents.  Called Dr. Fransisco Beau and discussed.   Patient denies shortness of breath, fever, cough, and chest pain at PAT appointment.  Patient verbalized understanding of instructions provided today at the PAT appointment.  Patient asked to review instructions at home and day of surgery.

## 2020-12-27 ENCOUNTER — Encounter (HOSPITAL_COMMUNITY): Payer: Self-pay | Admitting: Vascular Surgery

## 2020-12-27 ENCOUNTER — Ambulatory Visit (HOSPITAL_COMMUNITY): Payer: Medicare Other | Admitting: Certified Registered Nurse Anesthetist

## 2020-12-27 ENCOUNTER — Ambulatory Visit (HOSPITAL_COMMUNITY)
Admission: RE | Admit: 2020-12-27 | Discharge: 2020-12-27 | Disposition: A | Discharge status: Home / Self Care | Payer: Medicare Other | Attending: Vascular Surgery | Admitting: Vascular Surgery

## 2020-12-27 ENCOUNTER — Encounter (HOSPITAL_COMMUNITY)
Admission: RE | Disposition: A | Discharge status: Home / Self Care | Payer: Self-pay | Source: Home / Self Care | Attending: Vascular Surgery

## 2020-12-27 ENCOUNTER — Other Ambulatory Visit: Payer: Self-pay

## 2020-12-27 DIAGNOSIS — Z7982 Long term (current) use of aspirin: Secondary | ICD-10-CM | POA: Insufficient documentation

## 2020-12-27 DIAGNOSIS — Z881 Allergy status to other antibiotic agents status: Secondary | ICD-10-CM | POA: Diagnosis not present

## 2020-12-27 DIAGNOSIS — Z992 Dependence on renal dialysis: Secondary | ICD-10-CM | POA: Diagnosis not present

## 2020-12-27 DIAGNOSIS — Z888 Allergy status to other drugs, medicaments and biological substances status: Secondary | ICD-10-CM | POA: Diagnosis not present

## 2020-12-27 DIAGNOSIS — T82898A Other specified complication of vascular prosthetic devices, implants and grafts, initial encounter: Secondary | ICD-10-CM | POA: Diagnosis not present

## 2020-12-27 DIAGNOSIS — N186 End stage renal disease: Secondary | ICD-10-CM | POA: Diagnosis not present

## 2020-12-27 DIAGNOSIS — E1122 Type 2 diabetes mellitus with diabetic chronic kidney disease: Secondary | ICD-10-CM | POA: Diagnosis not present

## 2020-12-27 DIAGNOSIS — G8918 Other acute postprocedural pain: Secondary | ICD-10-CM | POA: Diagnosis not present

## 2020-12-27 DIAGNOSIS — Z79899 Other long term (current) drug therapy: Secondary | ICD-10-CM | POA: Insufficient documentation

## 2020-12-27 DIAGNOSIS — D631 Anemia in chronic kidney disease: Secondary | ICD-10-CM | POA: Diagnosis not present

## 2020-12-27 DIAGNOSIS — I120 Hypertensive chronic kidney disease with stage 5 chronic kidney disease or end stage renal disease: Secondary | ICD-10-CM | POA: Diagnosis not present

## 2020-12-27 DIAGNOSIS — Z885 Allergy status to narcotic agent status: Secondary | ICD-10-CM | POA: Diagnosis not present

## 2020-12-27 HISTORY — DX: Acute myocardial infarction, unspecified: I21.9

## 2020-12-27 HISTORY — DX: Atherosclerotic heart disease of native coronary artery without angina pectoris: I25.10

## 2020-12-27 HISTORY — PX: REVISON OF ARTERIOVENOUS FISTULA: SHX6074

## 2020-12-27 LAB — POCT I-STAT, CHEM 8
BUN: 53 mg/dL — ABNORMAL HIGH (ref 6–20)
Calcium, Ion: 1.09 mmol/L — ABNORMAL LOW (ref 1.15–1.40)
Chloride: 96 mmol/L — ABNORMAL LOW (ref 98–111)
Creatinine, Ser: 9 mg/dL — ABNORMAL HIGH (ref 0.61–1.24)
Glucose, Bld: 169 mg/dL — ABNORMAL HIGH (ref 70–99)
HCT: 26 % — ABNORMAL LOW (ref 39.0–52.0)
Hemoglobin: 8.8 g/dL — ABNORMAL LOW (ref 13.0–17.0)
Potassium: 5 mmol/L (ref 3.5–5.1)
Sodium: 134 mmol/L — ABNORMAL LOW (ref 135–145)
TCO2: 28 mmol/L (ref 22–32)

## 2020-12-27 LAB — GLUCOSE, CAPILLARY
Glucose-Capillary: 162 mg/dL — ABNORMAL HIGH (ref 70–99)
Glucose-Capillary: 171 mg/dL — ABNORMAL HIGH (ref 70–99)

## 2020-12-27 SURGERY — REVISON OF ARTERIOVENOUS FISTULA
Anesthesia: Monitor Anesthesia Care | Site: Arm Upper | Laterality: Right

## 2020-12-27 MED ORDER — PROPOFOL 10 MG/ML IV BOLUS
INTRAVENOUS | Status: AC
  Filled 2020-12-27: qty 20

## 2020-12-27 MED ORDER — SODIUM CHLORIDE 0.9 % IV SOLN: INTRAVENOUS | Status: DC

## 2020-12-27 MED ORDER — 0.9 % SODIUM CHLORIDE (POUR BTL) OPTIME
TOPICAL | Status: DC | PRN
  Administered 2020-12-27: 1000 mL

## 2020-12-27 MED ORDER — VANCOMYCIN HCL IN DEXTROSE 1-5 GM/200ML-% IV SOLN
INTRAVENOUS | Status: AC
  Administered 2020-12-27: 1000 mg via INTRAVENOUS
  Filled 2020-12-27: qty 200

## 2020-12-27 MED ORDER — FENTANYL CITRATE (PF) 100 MCG/2ML IJ SOLN: 25.0000 ug | INTRAMUSCULAR | Status: DC | PRN

## 2020-12-27 MED ORDER — DIPHENHYDRAMINE HCL 50 MG/ML IJ SOLN
12.5000 mg | Freq: Once | INTRAMUSCULAR | Status: AC
  Administered 2020-12-27: 12.5 mg via INTRAVENOUS

## 2020-12-27 MED ORDER — CHLORHEXIDINE GLUCONATE 0.12 % MT SOLN: 15.0000 mL | Freq: Once | OROMUCOSAL | Status: DC

## 2020-12-27 MED ORDER — ACETAMINOPHEN-CODEINE #3 300-30 MG PO TABS: 1.0000 | ORAL_TABLET | Freq: Four times a day (QID) | ORAL | 0 refills | Status: DC | PRN

## 2020-12-27 MED ORDER — DIPHENHYDRAMINE HCL 50 MG/ML IJ SOLN
INTRAMUSCULAR | Status: DC | PRN
  Administered 2020-12-27: 12.5 mg via INTRAVENOUS

## 2020-12-27 MED ORDER — LIDOCAINE-EPINEPHRINE (PF) 1.5 %-1:200000 IJ SOLN
INTRAMUSCULAR | Status: DC | PRN
  Administered 2020-12-27: 30 mL via PERINEURAL

## 2020-12-27 MED ORDER — BUPIVACAINE HCL (PF) 0.25 % IJ SOLN
INTRAMUSCULAR | Status: DC | PRN
  Administered 2020-12-27: 10 mL

## 2020-12-27 MED ORDER — MIDAZOLAM HCL 2 MG/2ML IJ SOLN
INTRAMUSCULAR | Status: AC
  Administered 2020-12-27: 2 mg
  Filled 2020-12-27: qty 2

## 2020-12-27 MED ORDER — ORAL CARE MOUTH RINSE: 15.0000 mL | Freq: Once | OROMUCOSAL | Status: DC

## 2020-12-27 MED ORDER — DIPHENHYDRAMINE HCL 50 MG/ML IJ SOLN
INTRAMUSCULAR | Status: AC
  Filled 2020-12-27: qty 1

## 2020-12-27 MED ORDER — PROPOFOL 500 MG/50ML IV EMUL
INTRAVENOUS | Status: DC | PRN
  Administered 2020-12-27: 50 ug/kg/min via INTRAVENOUS

## 2020-12-27 MED ORDER — VANCOMYCIN HCL IN DEXTROSE 1-5 GM/200ML-% IV SOLN: 1000.0000 mg | INTRAVENOUS | Status: AC

## 2020-12-27 MED ORDER — FENTANYL CITRATE (PF) 100 MCG/2ML IJ SOLN
INTRAMUSCULAR | Status: AC
  Administered 2020-12-27: 100 ug
  Filled 2020-12-27: qty 2

## 2020-12-27 SURGICAL SUPPLY — 34 items
ARMBAND PINK RESTRICT EXTREMIT (MISCELLANEOUS) ×2 IMPLANT
BANDAGE ESMARK 6X9 LF (GAUZE/BANDAGES/DRESSINGS) ×1 IMPLANT
BNDG ESMARK 6X9 LF (GAUZE/BANDAGES/DRESSINGS) ×2
CANISTER SUCT 3000ML PPV (MISCELLANEOUS) ×2 IMPLANT
CANNULA VESSEL 3MM 2 BLNT TIP (CANNULA) ×2 IMPLANT
CLIP VESOCCLUDE MED 6/CT (CLIP) ×4 IMPLANT
CLIP VESOCCLUDE SM WIDE 6/CT (CLIP) ×2 IMPLANT
COVER PROBE W GEL 5X96 (DRAPES) IMPLANT
COVER WAND RF STERILE (DRAPES) ×2 IMPLANT
CUFF TOURN SGL QUICK 24 (TOURNIQUET CUFF) ×2
CUFF TRNQT CYL 24X4X16.5-23 (TOURNIQUET CUFF) ×1 IMPLANT
DRSG COVADERM 4X6 (GAUZE/BANDAGES/DRESSINGS) ×2 IMPLANT
DRSG XEROFORM 1X8 (GAUZE/BANDAGES/DRESSINGS) ×2 IMPLANT
ELECT REM PT RETURN 9FT ADLT (ELECTROSURGICAL) ×2
ELECTRODE REM PT RTRN 9FT ADLT (ELECTROSURGICAL) ×1 IMPLANT
GLOVE SURG SS PI 8.0 STRL IVOR (GLOVE) ×2 IMPLANT
GOWN STRL REUS W/ TWL LRG LVL3 (GOWN DISPOSABLE) ×2 IMPLANT
GOWN STRL REUS W/ TWL XL LVL3 (GOWN DISPOSABLE) ×1 IMPLANT
GOWN STRL REUS W/TWL LRG LVL3 (GOWN DISPOSABLE) ×4
GOWN STRL REUS W/TWL XL LVL3 (GOWN DISPOSABLE) ×2
KIT BASIN OR (CUSTOM PROCEDURE TRAY) ×2 IMPLANT
KIT TURNOVER KIT B (KITS) ×2 IMPLANT
NS IRRIG 1000ML POUR BTL (IV SOLUTION) ×2 IMPLANT
PACK CV ACCESS (CUSTOM PROCEDURE TRAY) ×2 IMPLANT
PAD ARMBOARD 7.5X6 YLW CONV (MISCELLANEOUS) ×4 IMPLANT
SUT ETHILON 2 0 PSLX (SUTURE) ×4 IMPLANT
SUT MNCRL AB 4-0 PS2 18 (SUTURE) ×2 IMPLANT
SUT PROLENE 5 0 C 1 24 (SUTURE) ×2 IMPLANT
SUT PROLENE 6 0 BV (SUTURE) ×2 IMPLANT
SUT VIC AB 3-0 SH 27 (SUTURE) ×1
SUT VIC AB 3-0 SH 27X BRD (SUTURE) ×1 IMPLANT
TOWEL GREEN STERILE (TOWEL DISPOSABLE) ×2 IMPLANT
UNDERPAD 30X36 HEAVY ABSORB (UNDERPADS AND DIAPERS) ×2 IMPLANT
WATER STERILE IRR 1000ML POUR (IV SOLUTION) ×2 IMPLANT

## 2020-12-27 NOTE — Anesthesia Preprocedure Evaluation (Signed)
Anesthesia Evaluation  Patient identified by MRN, date of birth, ID band Patient awake    Reviewed: Allergy & Precautions, H&P , NPO status , Patient's Chart, lab work & pertinent test results  History of Anesthesia Complications (+) PONV  Airway Mallampati: II  TM Distance: >3 FB Neck ROM: Full    Dental no notable dental hx. (+) Teeth Intact, Dental Advisory Given   Pulmonary neg pulmonary ROS,    Pulmonary exam normal breath sounds clear to auscultation       Cardiovascular hypertension, Pt. on medications + CAD and + Past MI   Rhythm:Regular Rate:Normal     Neuro/Psych  Headaches, negative psych ROS   GI/Hepatic negative GI ROS, Neg liver ROS,   Endo/Other  diabetes  Renal/GU ESRF and DialysisRenal disease  negative genitourinary   Musculoskeletal   Abdominal   Peds  Hematology  (+) Blood dyscrasia, anemia ,   Anesthesia Other Findings   Reproductive/Obstetrics negative OB ROS                             Anesthesia Physical Anesthesia Plan  ASA: III  Anesthesia Plan: MAC and Regional   Post-op Pain Management:    Induction: Intravenous  PONV Risk Score and Plan: 3 and Propofol infusion, Midazolam and Ondansetron  Airway Management Planned: Simple Face Mask  Additional Equipment:   Intra-op Plan:   Post-operative Plan:   Informed Consent: I have reviewed the patients History and Physical, chart, labs and discussed the procedure including the risks, benefits and alternatives for the proposed anesthesia with the patient or authorized representative who has indicated his/her understanding and acceptance.     Dental advisory given  Plan Discussed with: CRNA  Anesthesia Plan Comments:         Anesthesia Quick Evaluation

## 2020-12-27 NOTE — Interval H&P Note (Signed)
History and Physical Interval Note:  12/27/2020 11:38 AM  [name]  has presented today for surgery, with the diagnosis of ESRD.  The various methods of treatment have been discussed with the patient and family. After consideration of risks, benefits and other options for treatment, the patient has consented to  Procedure(s) with comments: RIGHT UPPER EXTREMITY ARTERIOVENOUS FISTULA REVISiON (Right) - PERIPHERAL NERVE BLOCK as a surgical intervention.  The patient's history has been reviewed, patient examined, no change in status, stable for surgery.  I have reviewed the patient's chart and labs.  Questions were answered to the patient's satisfaction.     Cherre Robins

## 2020-12-27 NOTE — Anesthesia Postprocedure Evaluation (Signed)
Anesthesia Post Note  Patient: [name]  Procedure(s) Performed: RIGHT UPPER EXTREMITY ARTERIOVENOUS FISTULA REVISiON (Right Arm Upper)     Patient location during evaluation: PACU Anesthesia Type: Regional and MAC Level of consciousness: awake and alert Pain management: pain level controlled Vital Signs Assessment: post-procedure vital signs reviewed and stable Respiratory status: spontaneous breathing, nonlabored ventilation and respiratory function stable Cardiovascular status: stable and blood pressure returned to baseline Postop Assessment: no apparent nausea or vomiting Anesthetic complications: no   No complications documented.  Last Vitals:  Vitals:   12/27/20 1330 12/27/20 1345  BP: (!) 151/67 (!) 158/60  Pulse: 63 61  Resp: 20 14  Temp:  36.6 C  SpO2: 93% 94%    Last Pain:  Vitals:   12/27/20 1345  TempSrc:   PainSc: 0-No pain                 Pati Thinnes,W. EDMOND

## 2020-12-27 NOTE — Discharge Instructions (Signed)
Dialysis center should avoid area of incision for 6 weeks

## 2020-12-27 NOTE — Transfer of Care (Signed)
Immediate Anesthesia Transfer of Care Note  Patient: [name]  Procedure(s) Performed: RIGHT UPPER EXTREMITY ARTERIOVENOUS FISTULA REVISiON (Right Arm Upper)  Patient Location: PACU  Anesthesia Type:MAC and Regional  Level of Consciousness: awake, alert  and oriented  Airway & Oxygen Therapy: Patient Spontanous Breathing  Post-op Assessment: Report given to RN and Post -op Vital signs reviewed and stable  Post vital signs: Reviewed and stable  Last Vitals:  Vitals Value Taken Time  BP 163/82 12/27/20 1305  Temp    Pulse 59 12/27/20 1307  Resp 17 12/27/20 1307  SpO2 98 % 12/27/20 1307  Vitals shown include unvalidated device data.  Last Pain:  Vitals:   12/27/20 1034  TempSrc:   PainSc: 7       Patients Stated Pain Goal: 2 (29/56/21 3086)  Complications: No complications documented.

## 2020-12-27 NOTE — Anesthesia Procedure Notes (Signed)
Anesthesia Regional Block: Supraclavicular block   Pre-Anesthetic Checklist: ,, timeout performed, Correct Patient, Correct Site, Correct Laterality, Correct Procedure, Correct Position, site marked, Risks and benefits discussed, pre-op evaluation,  At surgeon's request and post-op pain management  Laterality: Right  Prep: Maximum Sterile Barrier Precautions used, chloraprep       Needles:  Injection technique: Single-shot  Needle Type: Echogenic Stimulator Needle     Needle Length: 5cm  Needle Gauge: 22     Additional Needles:   Procedures:,,,, ultrasound used (permanent image in chart),,,,  Narrative:  Start time: 12/27/2020 11:52 AM End time: 12/27/2020 12:02 PM Injection made incrementally with aspirations every 5 mL. Anesthesiologist: Roderic Palau, MD  Additional Notes: 2% Lidocaine skin wheel. Intercostobrachial block with 10cc of % Bupivicaine plain.

## 2020-12-27 NOTE — Op Note (Signed)
DATE OF SERVICE: 12/27/2020  PATIENT:  [name]  55 y.o. male  PRE-OPERATIVE DIAGNOSIS:  ESRD dialyzing through RUE hybrid AVF / AVG with two areas of hypopigmentation  POST-OPERATIVE DIAGNOSIS:  Same  PROCEDURE:   1) right upper extremity artervio-venous fistula revision  SURGEON:  Surgeon(s) and Role:    * Cherre Robins, MD - Primary  ASSISTANT: Arlee Muslim, PA-C  An assistant was required to facilitate exposure and expedite the case.  ANESTHESIA:   regional and MAC  EBL: min  BLOOD ADMINISTERED:none  DRAINS: none   LOCAL MEDICATIONS USED:  NONE  SPECIMEN:  none  COUNTS: confirmed correct.  TOURNIQUET:    Total Tourniquet Time Documented: Upper Arm (Right) - 9 minutes Total: Upper Arm (Right) - 9 minutes   PATIENT DISPOSITION:  PACU - hemodynamically stable.   Delay start of Pharmacological VTE agent (>24hrs) due to surgical blood loss or risk of bleeding: no  INDICATION FOR PROCEDURE: [name] is a 55 y.o. male with ESRD, dialyzing via RUE AVF/AVG hybrid. He has become concerned about two areas of hypopigmentation about the arm and desires revision. The patient understood and wished to proceed.  OPERATIVE FINDINGS: one area of fistula closely adherent to skin which needed to be excised. This was repaired with a two layer closure of 5-O prolene. This area of the fistula should not be accessed for six weeks.   DESCRIPTION OF PROCEDURE: After identification of the patient in the pre-operative holding area, the patient was transferred to the operating room. The patient was positioned supine on the operating room table. Anesthesia was induced. The right arm was prepped and draped in standard fashion. A surgical pause was performed confirming correct patient, procedure, and operative location.  An elipse incision was planned to excise the hypopigmented skin. A tourniquet was used to exsanguinate the arm.  Pneumatic tourniquet was inflated in the upper arm.   Incision was made with a 10 blade.  The incision was carried down through subcutaneous tissue with Bovie electrocautery.  A roughly 1 x 2 cm area of fistula was densely adherent to the skin.  This was sharply excised along with the affected skin and passed off the table.  The defect in the fistula was repaired with a 2 layer closure of 5-0 Prolene suture.  The pneumatic tourniquet was released.  Hemostasis was achieved in the surgical bed.  The wound was closed using 3-0 Vicryl and 2-0 nylon.  Upon completion of the case instrument and sharps counts were confirmed correct. The patient was transferred to the PACU in good condition. I was present for all portions of the procedure.  [name], MD [facility] Phone Number: [phone] 12/27/2020 12:57 PM

## 2020-12-28 ENCOUNTER — Encounter (HOSPITAL_COMMUNITY): Payer: Self-pay | Admitting: Vascular Surgery

## 2020-12-28 DIAGNOSIS — I739 Peripheral vascular disease, unspecified: Secondary | ICD-10-CM | POA: Diagnosis not present

## 2021-01-01 ENCOUNTER — Other Ambulatory Visit: Payer: Self-pay | Admitting: *Deleted

## 2021-01-01 DIAGNOSIS — N186 End stage renal disease: Secondary | ICD-10-CM

## 2021-01-01 MED ORDER — LIDOCAINE-PRILOCAINE 2.5-2.5 % EX CREA: 1.0000 "application " | TOPICAL_CREAM | CUTANEOUS | 0 refills | Status: DC | PRN

## 2021-01-02 ENCOUNTER — Other Ambulatory Visit: Payer: Self-pay

## 2021-01-02 DIAGNOSIS — N186 End stage renal disease: Secondary | ICD-10-CM

## 2021-01-02 MED ORDER — LIDOCAINE-PRILOCAINE 2.5-2.5 % EX CREA: 1.0000 "application " | TOPICAL_CREAM | CUTANEOUS | 6 refills | Status: DC | PRN

## 2021-01-03 DIAGNOSIS — L97512 Non-pressure chronic ulcer of other part of right foot with fat layer exposed: Secondary | ICD-10-CM | POA: Diagnosis not present

## 2021-01-04 DIAGNOSIS — E1129 Type 2 diabetes mellitus with other diabetic kidney complication: Secondary | ICD-10-CM | POA: Diagnosis not present

## 2021-01-06 ENCOUNTER — Telehealth: Payer: Self-pay | Admitting: Vascular Surgery

## 2021-01-06 NOTE — Telephone Encounter (Signed)
Pt called to state he had infiltration of his AV fistula.  Recent revision by Dr Stanford Breed.  He has a limited area for cannulation.  Will make pt NPO after midnight and schedule for tunneled dialysis catheter tomorrow.  Pt will be added to Dr Stanford Breed schedule but will arrive at 76 in case we have cancellations and earlier slot.  Ruta Hinds, MD [facility] [place] Office: [phone]

## 2021-01-07 ENCOUNTER — Observation Stay (HOSPITAL_COMMUNITY): Payer: Medicare Other

## 2021-01-07 ENCOUNTER — Encounter (HOSPITAL_COMMUNITY): Payer: Self-pay | Admitting: Certified Registered Nurse Anesthetist

## 2021-01-07 ENCOUNTER — Other Ambulatory Visit: Payer: Self-pay

## 2021-01-07 ENCOUNTER — Encounter (HOSPITAL_COMMUNITY): Payer: Self-pay | Admitting: Vascular Surgery

## 2021-01-07 ENCOUNTER — Encounter (HOSPITAL_COMMUNITY)
Admission: RE | Disposition: A | Discharge status: Home / Self Care | Payer: Self-pay | Source: Home / Self Care | Attending: Internal Medicine

## 2021-01-07 ENCOUNTER — Inpatient Hospital Stay (HOSPITAL_COMMUNITY)
Admission: RE | Admit: 2021-01-07 | Discharge: 2021-01-10 | DRG: 314 | Disposition: A | Discharge status: Home / Self Care | Payer: Medicare Other | Attending: Internal Medicine | Admitting: Internal Medicine

## 2021-01-07 DIAGNOSIS — Y712 Prosthetic and other implants, materials and accessory cardiovascular devices associated with adverse incidents: Secondary | ICD-10-CM | POA: Diagnosis present

## 2021-01-07 DIAGNOSIS — Z7982 Long term (current) use of aspirin: Secondary | ICD-10-CM | POA: Diagnosis not present

## 2021-01-07 DIAGNOSIS — Z833 Family history of diabetes mellitus: Secondary | ICD-10-CM

## 2021-01-07 DIAGNOSIS — D61818 Other pancytopenia: Secondary | ICD-10-CM | POA: Diagnosis present

## 2021-01-07 DIAGNOSIS — Z881 Allergy status to other antibiotic agents status: Secondary | ICD-10-CM

## 2021-01-07 DIAGNOSIS — Z89431 Acquired absence of right foot: Secondary | ICD-10-CM | POA: Diagnosis not present

## 2021-01-07 DIAGNOSIS — Z79899 Other long term (current) drug therapy: Secondary | ICD-10-CM | POA: Diagnosis not present

## 2021-01-07 DIAGNOSIS — D631 Anemia in chronic kidney disease: Secondary | ICD-10-CM | POA: Diagnosis present

## 2021-01-07 DIAGNOSIS — I120 Hypertensive chronic kidney disease with stage 5 chronic kidney disease or end stage renal disease: Secondary | ICD-10-CM | POA: Diagnosis present

## 2021-01-07 DIAGNOSIS — Z888 Allergy status to other drugs, medicaments and biological substances status: Secondary | ICD-10-CM

## 2021-01-07 DIAGNOSIS — T82898A Other specified complication of vascular prosthetic devices, implants and grafts, initial encounter: Secondary | ICD-10-CM | POA: Diagnosis not present

## 2021-01-07 DIAGNOSIS — Z419 Encounter for procedure for purposes other than remedying health state, unspecified: Secondary | ICD-10-CM

## 2021-01-07 DIAGNOSIS — R1031 Right lower quadrant pain: Secondary | ICD-10-CM | POA: Diagnosis present

## 2021-01-07 DIAGNOSIS — Z95820 Peripheral vascular angioplasty status with implants and grafts: Secondary | ICD-10-CM

## 2021-01-07 DIAGNOSIS — I10 Essential (primary) hypertension: Secondary | ICD-10-CM | POA: Diagnosis not present

## 2021-01-07 DIAGNOSIS — N186 End stage renal disease: Secondary | ICD-10-CM

## 2021-01-07 DIAGNOSIS — Z452 Encounter for adjustment and management of vascular access device: Secondary | ICD-10-CM | POA: Diagnosis not present

## 2021-01-07 DIAGNOSIS — N2581 Secondary hyperparathyroidism of renal origin: Secondary | ICD-10-CM | POA: Diagnosis present

## 2021-01-07 DIAGNOSIS — Z885 Allergy status to narcotic agent status: Secondary | ICD-10-CM | POA: Diagnosis not present

## 2021-01-07 DIAGNOSIS — E1122 Type 2 diabetes mellitus with diabetic chronic kidney disease: Secondary | ICD-10-CM | POA: Diagnosis present

## 2021-01-07 DIAGNOSIS — T82510A Breakdown (mechanical) of surgically created arteriovenous fistula, initial encounter: Principal | ICD-10-CM | POA: Diagnosis present

## 2021-01-07 DIAGNOSIS — Z992 Dependence on renal dialysis: Secondary | ICD-10-CM | POA: Diagnosis not present

## 2021-01-07 DIAGNOSIS — E785 Hyperlipidemia, unspecified: Secondary | ICD-10-CM | POA: Diagnosis present

## 2021-01-07 DIAGNOSIS — I2510 Atherosclerotic heart disease of native coronary artery without angina pectoris: Secondary | ICD-10-CM | POA: Diagnosis present

## 2021-01-07 DIAGNOSIS — E875 Hyperkalemia: Secondary | ICD-10-CM

## 2021-01-07 DIAGNOSIS — Z7902 Long term (current) use of antithrombotics/antiplatelets: Secondary | ICD-10-CM | POA: Diagnosis not present

## 2021-01-07 DIAGNOSIS — T82590A Other mechanical complication of surgically created arteriovenous fistula, initial encounter: Secondary | ICD-10-CM | POA: Diagnosis present

## 2021-01-07 DIAGNOSIS — E1151 Type 2 diabetes mellitus with diabetic peripheral angiopathy without gangrene: Secondary | ICD-10-CM | POA: Diagnosis present

## 2021-01-07 DIAGNOSIS — N250 Renal osteodystrophy: Secondary | ICD-10-CM | POA: Diagnosis not present

## 2021-01-07 DIAGNOSIS — E1129 Type 2 diabetes mellitus with other diabetic kidney complication: Secondary | ICD-10-CM | POA: Diagnosis not present

## 2021-01-07 DIAGNOSIS — E11319 Type 2 diabetes mellitus with unspecified diabetic retinopathy without macular edema: Secondary | ICD-10-CM | POA: Diagnosis present

## 2021-01-07 DIAGNOSIS — H338 Other retinal detachments: Secondary | ICD-10-CM | POA: Diagnosis present

## 2021-01-07 DIAGNOSIS — Z20822 Contact with and (suspected) exposure to covid-19: Secondary | ICD-10-CM | POA: Diagnosis present

## 2021-01-07 DIAGNOSIS — E119 Type 2 diabetes mellitus without complications: Secondary | ICD-10-CM

## 2021-01-07 DIAGNOSIS — J811 Chronic pulmonary edema: Secondary | ICD-10-CM | POA: Diagnosis not present

## 2021-01-07 DIAGNOSIS — I517 Cardiomegaly: Secondary | ICD-10-CM | POA: Diagnosis not present

## 2021-01-07 DIAGNOSIS — I1311 Hypertensive heart and chronic kidney disease without heart failure, with stage 5 chronic kidney disease, or end stage renal disease: Secondary | ICD-10-CM | POA: Diagnosis not present

## 2021-01-07 HISTORY — PX: INSERTION OF DIALYSIS CATHETER: SHX1324

## 2021-01-07 LAB — BASIC METABOLIC PANEL
Anion gap: 17 — ABNORMAL HIGH (ref 5–15)
Anion gap: 17 — ABNORMAL HIGH (ref 5–15)
BUN: 79 mg/dL — ABNORMAL HIGH (ref 6–20)
BUN: 81 mg/dL — ABNORMAL HIGH (ref 6–20)
CO2: 25 mmol/L (ref 22–32)
CO2: 24 mmol/L (ref 22–32)
Calcium: 9.6 mg/dL (ref 8.9–10.3)
Calcium: 9.7 mg/dL (ref 8.9–10.3)
Chloride: 94 mmol/L — ABNORMAL LOW (ref 98–111)
Chloride: 97 mmol/L — ABNORMAL LOW (ref 98–111)
Creatinine, Ser: 11.88 mg/dL — ABNORMAL HIGH (ref 0.61–1.24)
Creatinine, Ser: 11.79 mg/dL — ABNORMAL HIGH (ref 0.61–1.24)
GFR, Estimated: 5 mL/min — ABNORMAL LOW (ref >60)
GFR, Estimated: 5 mL/min — ABNORMAL LOW (ref >60)
Glucose, Bld: 191 mg/dL — ABNORMAL HIGH (ref 70–99)
Glucose, Bld: 94 mg/dL (ref 70–99)
Potassium: 7.2 mmol/L (ref 3.5–5.1)
Potassium: 6.8 mmol/L (ref 3.5–5.1)
Sodium: 136 mmol/L (ref 135–145)
Sodium: 138 mmol/L (ref 135–145)

## 2021-01-07 LAB — CBC
HCT: 25.5 % — ABNORMAL LOW (ref 39.0–52.0)
Hemoglobin: 7.8 g/dL — ABNORMAL LOW (ref 13.0–17.0)
MCH: 27.5 pg (ref 26.0–34.0)
MCHC: 30.6 g/dL (ref 30.0–36.0)
MCV: 89.8 fL (ref 80.0–100.0)
Platelets: 244 10*3/uL (ref 150–400)
RBC: 2.84 MIL/uL — ABNORMAL LOW (ref 4.22–5.81)
RDW: 19.9 % — ABNORMAL HIGH (ref 11.5–15.5)
WBC: 5.8 10*3/uL (ref 4.0–10.5)
nRBC: 0 % (ref 0.0–0.2)

## 2021-01-07 LAB — GLUCOSE, CAPILLARY
Glucose-Capillary: 200 mg/dL — ABNORMAL HIGH (ref 70–99)
Glucose-Capillary: 176 mg/dL — ABNORMAL HIGH (ref 70–99)
Glucose-Capillary: 135 mg/dL — ABNORMAL HIGH (ref 70–99)
Glucose-Capillary: 100 mg/dL — ABNORMAL HIGH (ref 70–99)

## 2021-01-07 LAB — POCT I-STAT, CHEM 8
BUN: 82 mg/dL — ABNORMAL HIGH (ref 6–20)
Calcium, Ion: 1.1 mmol/L — ABNORMAL LOW (ref 1.15–1.40)
Chloride: 102 mmol/L (ref 98–111)
Creatinine, Ser: 12 mg/dL — ABNORMAL HIGH (ref 0.61–1.24)
Glucose, Bld: 195 mg/dL — ABNORMAL HIGH (ref 70–99)
HCT: 31 % — ABNORMAL LOW (ref 39.0–52.0)
Hemoglobin: 10.5 g/dL — ABNORMAL LOW (ref 13.0–17.0)
Potassium: 7.3 mmol/L (ref 3.5–5.1)
Sodium: 135 mmol/L (ref 135–145)
TCO2: 27 mmol/L (ref 22–32)

## 2021-01-07 LAB — SARS CORONAVIRUS 2 BY RT PCR (HOSPITAL ORDER, PERFORMED IN ~~LOC~~ HOSPITAL LAB): SARS Coronavirus 2: NEGATIVE

## 2021-01-07 SURGERY — INSERTION OF DIALYSIS CATHETER
Anesthesia: LOCAL | Site: Groin | Laterality: Right

## 2021-01-07 MED ORDER — VANCOMYCIN HCL IN DEXTROSE 1-5 GM/200ML-% IV SOLN: 1000.0000 mg | INTRAVENOUS | Status: AC

## 2021-01-07 MED ORDER — CALCIUM GLUCONATE 10 % IV SOLN: 1.0000 g | Freq: Once | INTRAVENOUS | Status: DC

## 2021-01-07 MED ORDER — CALCIUM GLUCONATE-NACL 1-0.675 GM/50ML-% IV SOLN
1.0000 g | Freq: Once | INTRAVENOUS | Status: AC
  Administered 2021-01-07: 1000 mg via INTRAVENOUS
  Filled 2021-01-07: qty 50

## 2021-01-07 MED ORDER — HEPARIN SODIUM (PORCINE) 5000 UNIT/ML IJ SOLN
5000.0000 [IU] | Freq: Three times a day (TID) | INTRAMUSCULAR | Status: DC
  Administered 2021-01-08 – 2021-01-10 (×4): 5000 [IU] via SUBCUTANEOUS
  Filled 2021-01-07 (×5): qty 1

## 2021-01-07 MED ORDER — HYDRALAZINE HCL 50 MG PO TABS
50.0000 mg | ORAL_TABLET | Freq: Three times a day (TID) | ORAL | Status: DC
  Administered 2021-01-08 – 2021-01-10 (×7): 50 mg via ORAL
  Filled 2021-01-07 (×7): qty 1

## 2021-01-07 MED ORDER — DIPHENHYDRAMINE HCL 25 MG PO CAPS
25.0000 mg | ORAL_CAPSULE | Freq: Once | ORAL | Status: AC
  Administered 2021-01-07: 25 mg via ORAL
  Filled 2021-01-07 (×2): qty 1

## 2021-01-07 MED ORDER — CHLORHEXIDINE GLUCONATE 0.12 % MT SOLN: 15.0000 mL | Freq: Once | OROMUCOSAL | Status: DC

## 2021-01-07 MED ORDER — HEPARIN SODIUM (PORCINE) 1000 UNIT/ML IJ SOLN
INTRAMUSCULAR | Status: AC
  Filled 2021-01-07: qty 4

## 2021-01-07 MED ORDER — ISOSORBIDE DINITRATE 10 MG PO TABS
20.0000 mg | ORAL_TABLET | Freq: Three times a day (TID) | ORAL | Status: DC
  Administered 2021-01-07 – 2021-01-10 (×7): 20 mg via ORAL
  Filled 2021-01-07: qty 2
  Filled 2021-01-07 (×2): qty 1
  Filled 2021-01-07: qty 2
  Filled 2021-01-07: qty 1
  Filled 2021-01-07: qty 2
  Filled 2021-01-07: qty 1
  Filled 2021-01-07 (×2): qty 2

## 2021-01-07 MED ORDER — INSULIN ASPART 100 UNIT/ML ~~LOC~~ SOLN
SUBCUTANEOUS | Status: AC
  Administered 2021-01-07: 5 [IU] via INTRAVENOUS
  Filled 2021-01-07: qty 1

## 2021-01-07 MED ORDER — HEPARIN SODIUM (PORCINE) 1000 UNIT/ML IJ SOLN
INTRAMUSCULAR | Status: AC
  Filled 2021-01-07: qty 1

## 2021-01-07 MED ORDER — SODIUM CHLORIDE 0.9 % IV SOLN
INTRAVENOUS | Status: DC | PRN
  Administered 2021-01-07: 500 mL

## 2021-01-07 MED ORDER — SODIUM CHLORIDE 0.9% FLUSH
3.0000 mL | Freq: Two times a day (BID) | INTRAVENOUS | Status: DC
  Administered 2021-01-07 – 2021-01-09 (×5): 3 mL via INTRAVENOUS

## 2021-01-07 MED ORDER — ACETAMINOPHEN-CODEINE #3 300-30 MG PO TABS
1.0000 | ORAL_TABLET | Freq: Four times a day (QID) | ORAL | Status: DC | PRN
  Administered 2021-01-08 – 2021-01-10 (×5): 1 via ORAL
  Filled 2021-01-07 (×5): qty 1

## 2021-01-07 MED ORDER — DEXTROSE 50 % IV SOLN
INTRAVENOUS | Status: AC
  Administered 2021-01-07: 50 mL via INTRAVENOUS
  Filled 2021-01-07: qty 50

## 2021-01-07 MED ORDER — HEPARIN SODIUM (PORCINE) 1000 UNIT/ML IJ SOLN
INTRAMUSCULAR | Status: DC | PRN
  Administered 2021-01-07: 3500 [IU]

## 2021-01-07 MED ORDER — SODIUM CHLORIDE 0.9 % IV SOLN: INTRAVENOUS | Status: DC

## 2021-01-07 MED ORDER — INSULIN ASPART 100 UNIT/ML IV SOLN
5.0000 [IU] | Freq: Once | INTRAVENOUS | Status: AC
  Filled 2021-01-07: qty 0.05

## 2021-01-07 MED ORDER — SODIUM CHLORIDE 0.9 % IV SOLN
INTRAVENOUS | Status: AC
  Filled 2021-01-07: qty 1.2

## 2021-01-07 MED ORDER — DEXTROSE 50 % IV SOLN
1.0000 | Freq: Once | INTRAVENOUS | Status: AC
  Filled 2021-01-07: qty 50

## 2021-01-07 MED ORDER — DIPHENHYDRAMINE HCL 50 MG/ML IJ SOLN
INTRAMUSCULAR | Status: AC
  Administered 2021-01-07: 25 mg via INTRAVENOUS
  Filled 2021-01-07: qty 1

## 2021-01-07 MED ORDER — AMLODIPINE BESYLATE 10 MG PO TABS
10.0000 mg | ORAL_TABLET | Freq: Every day | ORAL | Status: DC
  Administered 2021-01-07 – 2021-01-09 (×3): 10 mg via ORAL
  Filled 2021-01-07 (×3): qty 1

## 2021-01-07 MED ORDER — SEVELAMER CARBONATE 800 MG PO TABS
1600.0000 mg | ORAL_TABLET | Freq: Three times a day (TID) | ORAL | Status: DC
  Administered 2021-01-09 – 2021-01-10 (×3): 1600 mg via ORAL
  Filled 2021-01-07 (×3): qty 2

## 2021-01-07 MED ORDER — LIDOCAINE HCL 2 % IJ SOLN
INTRAMUSCULAR | Status: DC | PRN
  Administered 2021-01-07: 7 mL

## 2021-01-07 MED ORDER — PROPRANOLOL HCL 80 MG PO TABS
80.0000 mg | ORAL_TABLET | Freq: Two times a day (BID) | ORAL | Status: DC
  Administered 2021-01-07 – 2021-01-10 (×6): 80 mg via ORAL
  Filled 2021-01-07 (×7): qty 1

## 2021-01-07 MED ORDER — ASPIRIN EC 81 MG PO TBEC
81.0000 mg | DELAYED_RELEASE_TABLET | Freq: Every day | ORAL | Status: DC
  Administered 2021-01-09 – 2021-01-10 (×2): 81 mg via ORAL
  Filled 2021-01-07 (×2): qty 1

## 2021-01-07 MED ORDER — ORAL CARE MOUTH RINSE
15.0000 mL | Freq: Once | OROMUCOSAL | Status: AC
  Administered 2021-01-07: 15 mL via OROMUCOSAL

## 2021-01-07 MED ORDER — TICAGRELOR 90 MG PO TABS
90.0000 mg | ORAL_TABLET | Freq: Two times a day (BID) | ORAL | Status: DC
  Administered 2021-01-08 – 2021-01-10 (×4): 90 mg via ORAL
  Filled 2021-01-07 (×5): qty 1

## 2021-01-07 MED ORDER — DIPHENHYDRAMINE HCL 50 MG/ML IJ SOLN
25.0000 mg | Freq: Once | INTRAMUSCULAR | Status: AC
  Filled 2021-01-07: qty 0.5

## 2021-01-07 SURGICAL SUPPLY — 29 items
BAG DECANTER FOR FLEXI CONT (MISCELLANEOUS) ×2 IMPLANT
BLADE SURG 11 STRL SS (BLADE) ×2 IMPLANT
CATH TL MAH 12.5FR 24CM (CATHETERS) ×2 IMPLANT
COVER PROBE W GEL 5X96 (DRAPES) ×2 IMPLANT
DECANTER SPIKE VIAL GLASS SM (MISCELLANEOUS) ×2 IMPLANT
DERMABOND ADVANCED (GAUZE/BANDAGES/DRESSINGS) ×1
DERMABOND ADVANCED .7 DNX12 (GAUZE/BANDAGES/DRESSINGS) ×1 IMPLANT
DRAPE CHEST BREAST 15X10 FENES (DRAPES) ×2 IMPLANT
DRAPE HALF SHEET 40X57 (DRAPES) ×6 IMPLANT
GAUZE 4X4 16PLY RFD (DISPOSABLE) ×2 IMPLANT
GLOVE BIO SURGEON STRL SZ7.5 (GLOVE) ×2 IMPLANT
GLOVE SRG 8 PF TXTR STRL LF DI (GLOVE) ×1 IMPLANT
GLOVE SURG UNDER POLY LF SZ8 (GLOVE) ×2
GOWN STRL REUS W/ TWL LRG LVL3 (GOWN DISPOSABLE) ×2 IMPLANT
GOWN STRL REUS W/ TWL XL LVL3 (GOWN DISPOSABLE) ×2 IMPLANT
GOWN STRL REUS W/TWL LRG LVL3 (GOWN DISPOSABLE) ×2
GOWN STRL REUS W/TWL XL LVL3 (GOWN DISPOSABLE) ×2
KIT TURNOVER KIT B (KITS) ×2 IMPLANT
NEEDLE 18GX1X1/2 (RX/OR ONLY) (NEEDLE) ×2 IMPLANT
NEEDLE HYPO 25GX1X1/2 BEV (NEEDLE) ×2 IMPLANT
PAD ARMBOARD 7.5X6 YLW CONV (MISCELLANEOUS) ×4 IMPLANT
SUT SILK 2 0 SH CR/8 (SUTURE) ×2 IMPLANT
SYR 10ML LL (SYRINGE) ×6 IMPLANT
SYR 20ML LL LF (SYRINGE) ×4 IMPLANT
SYR 5ML LL (SYRINGE) ×2 IMPLANT
SYR CONTROL 10ML LL (SYRINGE) ×2 IMPLANT
TOWEL GREEN STERILE (TOWEL DISPOSABLE) ×2 IMPLANT
TOWEL GREEN STERILE FF (TOWEL DISPOSABLE) ×2 IMPLANT
WATER STERILE IRR 1000ML POUR (IV SOLUTION) ×2 IMPLANT

## 2021-01-07 NOTE — H&P (Signed)
HPI  [name] XLK:[id number] DOB: 10-Oct-1966 DOA: 01/07/2021  PCP: [name], MD   Chief Complaint: hyperkalemia   HPI:  27 black male ESRD MWF starting in?  2019-dry weight about 86 kg usually sits on machine for 4 hours 6 to 8 kg per him  CAD status post cath = severe three-vessel disease managed medically 07/2020--myalgias with statin therapy PAD previous stent right leg with right limb salvage and amputation right foot 07/08/2019 DM TY 2 diagnosed >10 years ago off meds since 2006 2/2 of significant hypoglycemia last A1c 7.4  Coincidental pancytopenia referred to hematology previously Readmitted 09/2020 with peak troponin 15,000 and had PCI DES and balloon angioplasty septic second diagonal placed on aspirin Brilinta At that admission he had significant bleeding from right upper extremity fistula and apparently he was sent to [facility]--- he does have hyperkalemia at times per note review  He saw Dr. Clyda Hurdle [facility] ]on 10/16/2020 and underwent staged procedure with PCI  He was brought electively to [facility] for exchange of his malfunctioning right arm fistula-tells me that he has not been able to cannulate it properly over the past 3 attempts-he only had 1-1/2-hour of HD on Friday last  He was found to have a potassium of 7.8 -given calcium gluconate insulin dextrose to temporize Consult was requested by vascular surgery to admit overnight-Dr. Burnett Sheng consulted It appears that they are going to do a temporary dialysis catheter under Dr. Oneida Alar and plan for dialysis possibly in the a.m. At this time that I am seeing him in the PACU    Review of Systems:    Pertinent +'s: Some short of breath Pertinent -"s: Dark stool, tarry stool, unilateral visual loss seizure,, fall, unilateral weakness, blurred vision, double vision, dysuria,  ED Course: Patient seen in PACU   Past Medical History:  Diagnosis Date  . Abscess of right foot   . Acute osteomyelitis of right foot ([place])  05/15/2019  . Anemia 06/23/2019  . Cellulitis of right foot 05/22/2019  . Chest pain 08/23/2013  . Chills    at night - sometimes  . Complication of anesthesia   . Coronary artery disease   . Diabetes mellitus    controlled by diet  . 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 Diabetic foot ulcer- right second toe 05/14/2019  . Diabetic infection of right foot ([place])   . Diabetic retinopathy associated with type 2 diabetes mellitus ([place]) 06/23/2019  . Eczema   . ESRD on hemodialysis ([place]) 07/18/2011  . Essential hypertension 10/18/2007   Qualifier: Diagnosis of  By: Loanne Drilling MD, Jacelyn Pi   . Fatigue    loss of fatigue  . FOOT ULCER, LEFT 06/29/2008   Qualifier: Diagnosis of  By: Loanne Drilling MD, Sean A   . Gangrene of toe of right foot ([place])   . Headache(784.0)    Years ago  . Heart murmur    years ago  . History of blood transfusion   . Hyperglycemia 05/05/2015  . HYPERLIPIDEMIA 05/16/2008   Qualifier: Diagnosis of  By: Jenny Reichmann MD, Hunt Oris   . Hypoglycemia, unspecified 11/13/2008   Qualifier: Diagnosis of  By: Jenny Reichmann MD, Hunt Oris   . MRSA (methicillin resistant staph aureus) culture positive   . MRSA INFECTION 10/18/2007   Qualifier: Diagnosis of  By: Marca Ancona RMA, Lucy    . Myocardial infarction ([place])   . Obesity, unspecified 08/23/2013  . Osteomyelitis of great toe of right foot ([place]) 05/14/2019  . Other forms of retinal  detachment(361.89) 06/23/2019  . PONV (postoperative nausea and vomiting)   . Poor circulation   . Secondary hyperparathyroidism ([place]) 06/23/2019  . Sepsis ([place]) 05/14/2019  . Type 2 diabetes mellitus ([place]) 08/23/2013  . Ulcer of right foot, with necrosis of bone ([place])   . Ulcer of right second toe with necrosis of bone [facility])    Past Surgical History:  Procedure Laterality Date  . ABDOMINAL AORTOGRAM N/A 05/17/2019   Procedure: ABDOMINAL AORTOGRAM;  Surgeon: Elam Dutch, MD;  Location: [place] [facility];  Service: Cardiovascular;  Laterality: N/A;  . AMPUTATION TOE  Right 05/14/2019   Procedure: PARTIAL AMPUTATION SECOND TOE RIGHT FOOT;  Surgeon: Evelina Bucy, DPM;  Location: [place];  Service: Podiatry;  Laterality: Right;  . AMPUTATION TOE Right 05/23/2019   Procedure: AMPUTATION OF SECOND TOE METATARSAL PHALANGEAL JOINT;  Surgeon: Evelina Bucy, DPM;  Location: [place];  Service: Podiatry;  Laterality: Right;  . AMPUTATION TOE Right 05/27/2019   Procedure: Amputation Toe;  Surgeon: Evelina Bucy, DPM;  Location: [place];  Service: Podiatry;  Laterality: Right;  right third toe  . APPLICATION OF WOUND VAC Right 05/27/2019   Procedure: Application Of Wound Vac;  Surgeon: Evelina Bucy, DPM;  Location: [place];  Service: Podiatry;  Laterality: Right;  . AV FISTULA PLACEMENT  06-18-11   Right brachiocephalic AVF  . BONE BIOPSY Right 05/14/2019   Procedure: OPEN SUPERFICIAL BONE BIOPSY GREAT TOE;  Surgeon: Evelina Bucy, DPM;  Location: [place];  Service: Podiatry;  Laterality: Right;  . CORONARY BALLOON ANGIOPLASTY N/A 09/24/2020   Procedure: CORONARY BALLOON ANGIOPLASTY;  Surgeon: Martinique, Peter M, MD;  Location: [place] [facility];  Service: Cardiovascular;  Laterality: N/A;  . CORONARY STENT INTERVENTION N/A 09/24/2020   Procedure: CORONARY STENT INTERVENTION;  Surgeon: Martinique, Peter M, MD;  Location: [place] [facility];  Service: Cardiovascular;  Laterality: N/A;  . EYE SURGERY     left eye for Laser, diabetic retinopathy  . FISTULOGRAM Right 11/06/2011   Procedure: FISTULOGRAM;  Surgeon: Conrad [place], MD;  Location: [facility] CATH LAB;  Service: Cardiovascular;  Laterality: Right;  . HEMATOMA EVACUATION Right Feb. 25, 2014  . HEMATOMA EVACUATION Right 12/14/2012   Procedure: EVACUATION HEMATOMA;  Surgeon: Conrad Coleman, MD;  Location: [place];  Service: Vascular;  Laterality: Right;  . INCISION AND DRAINAGE Right 05/23/2019   Procedure: INCISION AND DRAINAGE OF RIGHT FOOT DEEP SPACE ABSCESS;  Surgeon: Evelina Bucy, DPM;  Location: [place];  Service: Podiatry;   Laterality: Right;  . INSERTION OF DIALYSIS CATHETER  12/14/2012   Procedure: INSERTION OF DIALYSIS CATHETER;  Surgeon: Conrad [place], MD;  Location: [place];  Service: Vascular;;  . INTRAVASCULAR PRESSURE WIRE/FFR STUDY N/A 09/24/2020   Procedure: INTRAVASCULAR PRESSURE WIRE/FFR STUDY;  Surgeon: Martinique, Peter M, MD;  Location: [place] [facility];  Service: Cardiovascular;  Laterality: N/A;  . IRRIGATION AND DEBRIDEMENT FOOT Right 05/25/2019   Procedure: INCISION AND DRAINAGE FOOT;  Surgeon: Evelina Bucy, DPM;  Location: [place];  Service: Podiatry;  Laterality: Right;  . IRRIGATION AND DEBRIDEMENT FOOT Right 05/27/2019   Procedure: IRRIGATION AND DEBRIDEMENT FOOT;  Surgeon: Evelina Bucy, DPM;  Location: [place];  Service: Podiatry;  Laterality: Right;  . LEFT HEART CATH AND CORONARY ANGIOGRAPHY N/A 09/24/2020   Procedure: LEFT HEART CATH AND CORONARY ANGIOGRAPHY;  Surgeon: Martinique, Peter M, MD;  Location: [place] [facility];  Service: Cardiovascular;  Laterality: N/A;  .  LOWER EXTREMITY ANGIOGRAPHY Bilateral 05/17/2019   Procedure: LOWER EXTREMITY ANGIOGRAPHY;  Surgeon: Elam Dutch, MD;  Location: [place] [facility];  Service: Cardiovascular;  Laterality: Bilateral;  . REVISON OF ARTERIOVENOUS FISTULA Right 12/14/2012   Procedure: REVISON OF upper arm ARTERIOVENOUS FISTULA using 17mx10cm gortex graft;  Surgeon: BConrad [place] MD;  [place]  Service: Vascular;  Laterality: Right;  . REVISON OF ARTERIOVENOUS FISTULA Right 12/27/2020   Procedure: RIGHT UPPER EXTREMITY ARTERIOVENOUS FISTULA REVISiON;  Surgeon: HCherre Robins MD;  [place]  Service: Vascular;  Laterality: Right;  PERIPHERAL NERVE BLOCK  . RIGHT/LEFT HEART CATH AND CORONARY ANGIOGRAPHY N/A 08/07/2020   Procedure: RIGHT/LEFT HEART CATH AND CORONARY ANGIOGRAPHY;  Surgeon: JMartinique Peter M, MD;  [place];  Service: Cardiovascular;  Laterality: N/A;  . SOFT TISSUE MASS EXCISION     Right arm, Left leg  for  MRSA infection    reports that he has never smoked. He has never used smokeless tobacco. He reports that he does not drink alcohol and does not use drugs.  Mobility: Independent but is disabled because of HD since  Allergies  Allergen Reactions  . Atorvastatin     Other reaction(s): Muscle Pain  . Rosuvastatin     Other reaction(s): Muscle Pain  . Amoxicillin Itching    Did it involve swelling of the face/tongue/throat, SOB, or low BP? Unknown Did it involve sudden or severe rash/hives, skin peeling, or any reaction on the inside of your mouth or nose? Unknown Did you need to seek medical attention at a hospital or doctor's office? Unknown When did it last happen?20 years ago If all above answers are "NO", may proceed with cephalosporin use.   .Marland KitchenHydromorphone Itching    Patient states he may take with benadryl. Makes feet itch.  .Marland KitchenPercocet [Oxycodone-Acetaminophen] Itching and Other (See Comments)    Legs only. Itching and burning feeling.  . Chlorhexidine Hives    Patient has blistering  . Gabapentin Nausea And Vomiting    Stomach issues    Family History  Problem Relation Age of Onset  . Diabetes Mother    Prior to Admission medications   Medication Sig Start Date End Date Taking? Authorizing Provider  acetaminophen-codeine (TYLENOL #3) 300-30 MG tablet Take 1 tablet by mouth every 6 (six) hours as needed. Patient taking differently: Take 1 tablet by mouth every 6 (six) hours as needed for moderate pain. 12/27/20  Yes EDagoberto Ligas PA-C  amLODipine (NORVASC) 10 MG tablet Take 10 mg by mouth at bedtime.  08/03/20  Yes [provider]  aspirin EC 81 MG tablet Take 1 tablet (81 mg total) by mouth daily. 06/28/19  Yes AWellington Hampshire MD  BRILINTA 90 MG TABS tablet Take 90 mg by mouth 2 (two) times daily. 12/26/20  Yes [provider]  hydrALAZINE (APRESOLINE) 50 MG tablet Take 50 mg by mouth 3 (three) times daily. 12/18/20  Yes [provider]   isosorbide dinitrate (ISORDIL) 20 MG tablet Take 20 mg by mouth 3 (three) times daily. 08/24/20  Yes [provider]  lidocaine-prilocaine (EMLA) cream Apply 1 application topically as needed. Patient taking differently: Apply 1 application topically as needed (port access). 01/02/21  Yes HCherre Robins MD  propranolol (INDERAL) 80 MG tablet Take 1 tablet (80 mg total) by mouth 2 (two) times daily. 09/26/20  Yes Hongalgi, ALenis Dickinson MD  sevelamer carbonate (RENVELA) 800 MG tablet Take 1,600 mg by mouth 3 (  three) times daily with meals. 07/25/20  Yes [provider]  blood glucose meter kit and supplies Dispense based on patient and insurance preference. Use up to four times daily as directed. (FOR ICD-10 E10.9, E11.9). 05/18/19   Mikhail, Velta Addison, DO  blood glucose meter kit and supplies Dispense based on patient and insurance preference. Use up to four times daily as directed. (FOR ICD-9 250.00, 250.01). 05/18/19   Cristal Ford, DO  Continuous Blood Gluc Sensor (FREESTYLE LIBRE 14 DAY SENSOR) MISC See admin instructions. 06/20/19   [provider]  glucose blood (ACCU-CHEK AVIVA PLUS) test strip Use as instructed 05/18/19   Cristal Ford, DO  hydrALAZINE (APRESOLINE) 25 MG tablet Take 1 tablet (25 mg total) by mouth every 8 (eight) hours. Do not take morning of dialysis. Patient not taking: Reported on 01/07/2021 08/09/20   Freida Busman, MD  Lancet Devices [facility]) lancets Use as instructed 05/18/19   Cristal Ford, DO  loratadine (CLARITIN) 10 MG tablet Take 10 mg by mouth daily as needed for allergies.  11/06/19  [provider]    Physical Exam:  Vitals:   01/07/21 1411 01/07/21 1414  BP: (!) 232/92 (!) 202/89  Pulse: 60   Resp:    Temp:    SpO2: 100%      Awake coherent hyperkeratotic area over left eye  Mild JVD at bedside  Chest clear no rales rhonchi  S1-S2 tachycardic 110  Abdomen soft no rebound no  guarding  Fistula   Right lower extremity was not examined beyond dressings as he was about to have a sterile procedure    I have personally reviewed following labs and imaging studies  Labs:   Sodium 138 potassium 7.3-->6.8 BUNs/creatinine 81/11.7 anion gap 17 bicarb 24  Hemoglobin 10.5  Imaging studies:   None  Medical tests:   EKG independently reviewed: Postprocedure EKG ordered  Test discussed with performing physician:  Yes discussed with Dr. Oneida Alar and Dr. Burnett Sheng  Decision to obtain old records:   Yes  Review and summation of old records:   Yes reviewed  Active Problems:   * No active hospital problems. *   Assessment/Plan Malfunctioning right upper arm AV fistula Per vascular surgery-temporary access being placed by Dr. Oneida Alar now ESRD MWF baseline weight 86 kg Getting dialysis tonight for indication of acute hyperkalemia EKG post procedure-May need to repeat insulin calcium gluconate etc. if peaked T waves Nephrology to comment on phosphorus, iron stores etc. Severe PAD status post forefoot amputation right leg Wounds to be managed in the outpatient setting with PCP/wound care Will be reviewed prior to discharge Continue Tylenol 3 as per home dosing CAD with PCI 10/08/2020 Needs DAPT Brilinta aspirin as per restart orders per vascular surgery-I have resumed them Continue Imdur 20 3 times daily hydralazine 50 3 times daily amlodipine 10 at bedtime Inderal 80 twice daily DM TY 2 not on meds-last A1c 7.4 Uses continuous glucose sensor Careful with sliding scale coverage would not use at this time    Severity of Illness: The appropriate patient status for this patient is OBSERVATION. Observation status is judged to be reasonable and necessary in order to provide the required intensity of service to ensure the patient's safety. The patient's presenting symptoms, physical exam findings, and initial radiographic and laboratory data in the context of  their medical condition is felt to place them at decreased risk for further clinical deterioration. Furthermore, it is anticipated that the patient will be medically stable for discharge  from the hospital within 2 midnights of admission. The following factors support the patient status of observation.   " The patient's presenting symptoms include malfunctioning catheter. " The physical exam findings include pain and swelling of upper arm. " The initial radiographic and laboratory data are somewhat reassuring.     DVT prophylaxis: Heparin Code Status: Full Family Communication: None at bedside Consults called: Nephrology  Time spent: 65 minutes  Verlon Au, MD Jerl Mina my NP partners at night for Care related issues] [facility] --[employer] [employer] [facility] , [email or web address]; password [facility]  01/07/2021, 4:29 PM

## 2021-01-07 NOTE — Op Note (Signed)
Procedure: Insertion of right femoral venous temporary dialysis catheter  Preoperative diagnosis: End-stage renal disease  Postoperative diagnosis: Same  Anesthesia: Local  Operative findings: 20 cm temporary dialysis catheter placed right femoral vein ready for use  Operative details: After obtaining form consent, the patient was prepped and draped in usual sterile fashion in both groins.  Ultrasound was used to identify the right common femoral vein.  An introducer needle was used to cannulate the right common femoral vein after infiltration of local anesthesia.  An 035 J-tip guidewire was advanced up into the right femoral vein approximately 30 cm.  Tract was dilated with sequential dilators.  The dialysis catheter was then placed over the guidewire to the 20 cm mark on the catheter.  Catheter was noted to flush and draw easily through all 3 ports.  The catheter was sutured the skin with silk sutures.  Catheter was loaded with concentrated heparin solution with 1.5 ml in each side port and 0.5 ml in the middle port.  This was then capped.  Patient tolerated procedure well and there were no complications.  Ruta Hinds, MD [facility] Office: [phone]

## 2021-01-07 NOTE — Progress Notes (Signed)
Pt here for vasc access procedure but due to hyperkalemia he couldn't have it. K+ was 7.2, he was given temporizing measures and repeat K+ was down to 6.8. Pt seen briefly in Short Stay. Awake, coherent and not in distress. VVS preparing for temp HD cath access. Orders are in for HD upstairs this evening. Will reassess in the morning, or if needed later tonight.   Kelly Splinter, MD 01/07/2021, 4:53 PM

## 2021-01-07 NOTE — Progress Notes (Addendum)
CRITICAL RESULT PROVIDER NOTIFICATION  Test performed and critical result:  K 7.3  Date and time result received:  01/07/21 1007  Provider name/title: Dr. Ola Spurr and Dr. Carlis Abbott  Date and time provider notified: 1013  Date and time provider responded: [id number]  Provider response:  Draw BMET   Patient on monitor.  Will continue to monitor.

## 2021-01-07 NOTE — Progress Notes (Signed)
CRITICAL RESULT PROVIDER NOTIFICATION  Test performed and critical result:  K 7.2  Date and time result received:  01/07/21; 1200  Provider name/title: Dr. Carlis Abbott   Date and time provider notified: 01/07/21 at 1204  Date and time provider responded: 01/07/21 at 1204  Provider response: Dr. Carlis Abbott currently in surgery and is aware; stated that he would return phone call with further orders

## 2021-01-08 ENCOUNTER — Inpatient Hospital Stay (HOSPITAL_COMMUNITY): Payer: Medicare Other

## 2021-01-08 ENCOUNTER — Ambulatory Visit (HOSPITAL_COMMUNITY): Admission: RE | Admit: 2021-01-08 | Payer: Medicare Other | Source: Home / Self Care | Admitting: Vascular Surgery

## 2021-01-08 ENCOUNTER — Inpatient Hospital Stay (HOSPITAL_COMMUNITY): Payer: Medicare Other | Admitting: Certified Registered Nurse Anesthetist

## 2021-01-08 ENCOUNTER — Encounter (HOSPITAL_COMMUNITY)
Admission: RE | Disposition: A | Discharge status: Home / Self Care | Payer: Self-pay | Source: Home / Self Care | Attending: Internal Medicine

## 2021-01-08 ENCOUNTER — Encounter (HOSPITAL_COMMUNITY): Payer: Self-pay | Admitting: Nephrology

## 2021-01-08 DIAGNOSIS — D61818 Other pancytopenia: Secondary | ICD-10-CM | POA: Diagnosis present

## 2021-01-08 DIAGNOSIS — Z89431 Acquired absence of right foot: Secondary | ICD-10-CM | POA: Diagnosis not present

## 2021-01-08 DIAGNOSIS — N186 End stage renal disease: Secondary | ICD-10-CM | POA: Diagnosis not present

## 2021-01-08 DIAGNOSIS — Z20822 Contact with and (suspected) exposure to covid-19: Secondary | ICD-10-CM | POA: Diagnosis present

## 2021-01-08 DIAGNOSIS — Y712 Prosthetic and other implants, materials and accessory cardiovascular devices associated with adverse incidents: Secondary | ICD-10-CM | POA: Diagnosis present

## 2021-01-08 DIAGNOSIS — E785 Hyperlipidemia, unspecified: Secondary | ICD-10-CM | POA: Diagnosis present

## 2021-01-08 DIAGNOSIS — E875 Hyperkalemia: Secondary | ICD-10-CM

## 2021-01-08 DIAGNOSIS — D631 Anemia in chronic kidney disease: Secondary | ICD-10-CM | POA: Diagnosis not present

## 2021-01-08 DIAGNOSIS — Z95820 Peripheral vascular angioplasty status with implants and grafts: Secondary | ICD-10-CM | POA: Diagnosis not present

## 2021-01-08 DIAGNOSIS — Z7982 Long term (current) use of aspirin: Secondary | ICD-10-CM | POA: Diagnosis not present

## 2021-01-08 DIAGNOSIS — E11319 Type 2 diabetes mellitus with unspecified diabetic retinopathy without macular edema: Secondary | ICD-10-CM | POA: Diagnosis present

## 2021-01-08 DIAGNOSIS — Z888 Allergy status to other drugs, medicaments and biological substances status: Secondary | ICD-10-CM | POA: Diagnosis not present

## 2021-01-08 DIAGNOSIS — H338 Other retinal detachments: Secondary | ICD-10-CM | POA: Diagnosis present

## 2021-01-08 DIAGNOSIS — I2510 Atherosclerotic heart disease of native coronary artery without angina pectoris: Secondary | ICD-10-CM | POA: Diagnosis present

## 2021-01-08 DIAGNOSIS — T82590A Other mechanical complication of surgically created arteriovenous fistula, initial encounter: Secondary | ICD-10-CM | POA: Diagnosis not present

## 2021-01-08 DIAGNOSIS — T82510A Breakdown (mechanical) of surgically created arteriovenous fistula, initial encounter: Secondary | ICD-10-CM | POA: Diagnosis present

## 2021-01-08 DIAGNOSIS — I10 Essential (primary) hypertension: Secondary | ICD-10-CM | POA: Diagnosis not present

## 2021-01-08 DIAGNOSIS — I120 Hypertensive chronic kidney disease with stage 5 chronic kidney disease or end stage renal disease: Secondary | ICD-10-CM | POA: Diagnosis present

## 2021-01-08 DIAGNOSIS — Z992 Dependence on renal dialysis: Secondary | ICD-10-CM | POA: Diagnosis not present

## 2021-01-08 DIAGNOSIS — E1122 Type 2 diabetes mellitus with diabetic chronic kidney disease: Secondary | ICD-10-CM | POA: Diagnosis not present

## 2021-01-08 DIAGNOSIS — Z833 Family history of diabetes mellitus: Secondary | ICD-10-CM | POA: Diagnosis not present

## 2021-01-08 DIAGNOSIS — N2581 Secondary hyperparathyroidism of renal origin: Secondary | ICD-10-CM | POA: Diagnosis present

## 2021-01-08 DIAGNOSIS — Z79899 Other long term (current) drug therapy: Secondary | ICD-10-CM | POA: Diagnosis not present

## 2021-01-08 DIAGNOSIS — Z881 Allergy status to other antibiotic agents status: Secondary | ICD-10-CM | POA: Diagnosis not present

## 2021-01-08 DIAGNOSIS — Z885 Allergy status to narcotic agent status: Secondary | ICD-10-CM | POA: Diagnosis not present

## 2021-01-08 DIAGNOSIS — T82898A Other specified complication of vascular prosthetic devices, implants and grafts, initial encounter: Secondary | ICD-10-CM | POA: Diagnosis not present

## 2021-01-08 DIAGNOSIS — Z7902 Long term (current) use of antithrombotics/antiplatelets: Secondary | ICD-10-CM | POA: Diagnosis not present

## 2021-01-08 DIAGNOSIS — E1151 Type 2 diabetes mellitus with diabetic peripheral angiopathy without gangrene: Secondary | ICD-10-CM | POA: Diagnosis present

## 2021-01-08 HISTORY — PX: INSERTION OF DIALYSIS CATHETER: SHX1324

## 2021-01-08 HISTORY — PX: REMOVAL OF A DIALYSIS CATHETER: SHX6053

## 2021-01-08 LAB — CBC
HCT: 26.5 % — ABNORMAL LOW (ref 39.0–52.0)
Hemoglobin: 8.4 g/dL — ABNORMAL LOW (ref 13.0–17.0)
MCH: 28.4 pg (ref 26.0–34.0)
MCHC: 31.7 g/dL (ref 30.0–36.0)
MCV: 89.5 fL (ref 80.0–100.0)
Platelets: 239 10*3/uL (ref 150–400)
RBC: 2.96 MIL/uL — ABNORMAL LOW (ref 4.22–5.81)
RDW: 20.6 % — ABNORMAL HIGH (ref 11.5–15.5)
WBC: 5.9 10*3/uL (ref 4.0–10.5)
nRBC: 0 % (ref 0.0–0.2)

## 2021-01-08 LAB — POCT I-STAT, CHEM 8
BUN: 66 mg/dL — ABNORMAL HIGH (ref 6–20)
BUN: 19 mg/dL (ref 6–20)
Calcium, Ion: 1.12 mmol/L — ABNORMAL LOW (ref 1.15–1.40)
Calcium, Ion: 1.25 mmol/L (ref 1.15–1.40)
Chloride: 101 mmol/L (ref 98–111)
Chloride: 97 mmol/L — ABNORMAL LOW (ref 98–111)
Creatinine, Ser: 10.3 mg/dL — ABNORMAL HIGH (ref 0.61–1.24)
Creatinine, Ser: 3.4 mg/dL — ABNORMAL HIGH (ref 0.61–1.24)
Glucose, Bld: 142 mg/dL — ABNORMAL HIGH (ref 70–99)
Glucose, Bld: 108 mg/dL — ABNORMAL HIGH (ref 70–99)
HCT: 25 % — ABNORMAL LOW (ref 39.0–52.0)
HCT: 32 % — ABNORMAL LOW (ref 39.0–52.0)
Hemoglobin: 8.5 g/dL — ABNORMAL LOW (ref 13.0–17.0)
Hemoglobin: 10.9 g/dL — ABNORMAL LOW (ref 13.0–17.0)
Potassium: 5.8 mmol/L — ABNORMAL HIGH (ref 3.5–5.1)
Potassium: 2.6 mmol/L — CL (ref 3.5–5.1)
Sodium: 136 mmol/L (ref 135–145)
Sodium: 138 mmol/L (ref 135–145)
TCO2: 23 mmol/L (ref 22–32)
TCO2: 32 mmol/L (ref 22–32)

## 2021-01-08 LAB — COMPREHENSIVE METABOLIC PANEL
ALT: 20 U/L (ref 0–44)
AST: 24 U/L (ref 15–41)
Albumin: 3.3 g/dL — ABNORMAL LOW (ref 3.5–5.0)
Alkaline Phosphatase: 167 U/L — ABNORMAL HIGH (ref 38–126)
Anion gap: 15 (ref 5–15)
BUN: 65 mg/dL — ABNORMAL HIGH (ref 6–20)
CO2: 21 mmol/L — ABNORMAL LOW (ref 22–32)
Calcium: 9.1 mg/dL (ref 8.9–10.3)
Chloride: 100 mmol/L (ref 98–111)
Creatinine, Ser: 9.81 mg/dL — ABNORMAL HIGH (ref 0.61–1.24)
GFR, Estimated: 6 mL/min — ABNORMAL LOW (ref >60)
Glucose, Bld: 189 mg/dL — ABNORMAL HIGH (ref 70–99)
Potassium: 6.4 mmol/L (ref 3.5–5.1)
Sodium: 136 mmol/L (ref 135–145)
Total Bilirubin: 1.7 mg/dL — ABNORMAL HIGH (ref 0.3–1.2)
Total Protein: 6.8 g/dL (ref 6.5–8.1)

## 2021-01-08 LAB — GLUCOSE, CAPILLARY
Glucose-Capillary: 154 mg/dL — ABNORMAL HIGH (ref 70–99)
Glucose-Capillary: 112 mg/dL — ABNORMAL HIGH (ref 70–99)

## 2021-01-08 LAB — POTASSIUM
Potassium: 5.5 mmol/L — ABNORMAL HIGH (ref 3.5–5.1)
Potassium: 5.8 mmol/L — ABNORMAL HIGH (ref 3.5–5.1)
Potassium: 6.3 mmol/L (ref 3.5–5.1)
Potassium: 3.4 mmol/L — ABNORMAL LOW (ref 3.5–5.1)
Potassium: 5.7 mmol/L — ABNORMAL HIGH (ref 3.5–5.1)

## 2021-01-08 LAB — SURGICAL PCR SCREEN
MRSA, PCR: NEGATIVE
Staphylococcus aureus: NEGATIVE

## 2021-01-08 SURGERY — INSERTION OF DIALYSIS CATHETER
Anesthesia: Monitor Anesthesia Care | Site: Leg Upper | Laterality: Right

## 2021-01-08 MED ORDER — PROMETHAZINE HCL 25 MG PO TABS
ORAL_TABLET | ORAL | Status: AC
  Filled 2021-01-08: qty 1

## 2021-01-08 MED ORDER — LIDOCAINE-EPINEPHRINE 0.5 %-1:200000 IJ SOLN
INTRAMUSCULAR | Status: AC
  Filled 2021-01-08: qty 1

## 2021-01-08 MED ORDER — ONDANSETRON HCL 4 MG/2ML IJ SOLN
INTRAMUSCULAR | Status: AC
  Filled 2021-01-08: qty 2

## 2021-01-08 MED ORDER — TRAZODONE HCL 50 MG PO TABS
25.0000 mg | ORAL_TABLET | Freq: Every evening | ORAL | Status: DC | PRN
  Administered 2021-01-08 – 2021-01-09 (×3): 25 mg via ORAL
  Filled 2021-01-08 (×3): qty 1

## 2021-01-08 MED ORDER — CEFAZOLIN SODIUM-DEXTROSE 2-3 GM-%(50ML) IV SOLR
INTRAVENOUS | Status: DC | PRN
  Administered 2021-01-08: 2 g via INTRAVENOUS

## 2021-01-08 MED ORDER — FENTANYL CITRATE (PF) 250 MCG/5ML IJ SOLN
INTRAMUSCULAR | Status: DC | PRN
  Administered 2021-01-08: 25 ug via INTRAVENOUS

## 2021-01-08 MED ORDER — HEPARIN SODIUM (PORCINE) 1000 UNIT/ML IJ SOLN
INTRAMUSCULAR | Status: AC
  Filled 2021-01-08: qty 1

## 2021-01-08 MED ORDER — SODIUM CHLORIDE 0.9 % IV SOLN
INTRAVENOUS | Status: AC
  Filled 2021-01-08: qty 1.2

## 2021-01-08 MED ORDER — ONDANSETRON HCL 4 MG/2ML IJ SOLN
4.0000 mg | Freq: Once | INTRAMUSCULAR | Status: AC
  Administered 2021-01-08: 4 mg via INTRAVENOUS

## 2021-01-08 MED ORDER — HEPARIN SODIUM (PORCINE) 1000 UNIT/ML DIALYSIS: 2000.0000 [IU] | Freq: Once | INTRAMUSCULAR | Status: DC

## 2021-01-08 MED ORDER — HEPARIN SODIUM (PORCINE) 1000 UNIT/ML IJ SOLN
INTRAMUSCULAR | Status: DC | PRN
  Administered 2021-01-08: 4200 [IU]

## 2021-01-08 MED ORDER — DIPHENHYDRAMINE HCL 25 MG PO CAPS
25.0000 mg | ORAL_CAPSULE | Freq: Four times a day (QID) | ORAL | Status: DC | PRN
  Administered 2021-01-08 – 2021-01-10 (×4): 25 mg via ORAL
  Filled 2021-01-08 (×4): qty 1

## 2021-01-08 MED ORDER — FENTANYL CITRATE (PF) 100 MCG/2ML IJ SOLN
25.0000 ug | INTRAMUSCULAR | Status: DC | PRN
Start: 2021-01-08 — End: 2021-01-08
  Administered 2021-01-08: 25 ug via INTRAVENOUS

## 2021-01-08 MED ORDER — 0.9 % SODIUM CHLORIDE (POUR BTL) OPTIME
TOPICAL | Status: DC | PRN
  Administered 2021-01-08: 1000 mL

## 2021-01-08 MED ORDER — SODIUM CHLORIDE 0.9 % IV SOLN
INTRAVENOUS | Status: DC | PRN
  Administered 2021-01-08: 500 mL

## 2021-01-08 MED ORDER — PROPOFOL 10 MG/ML IV BOLUS
INTRAVENOUS | Status: DC | PRN
  Administered 2021-01-08: 20 mg via INTRAVENOUS

## 2021-01-08 MED ORDER — PROMETHAZINE HCL 25 MG PO TABS: 12.5000 mg | ORAL_TABLET | Freq: Four times a day (QID) | ORAL | Status: DC | PRN

## 2021-01-08 MED ORDER — DIPHENHYDRAMINE HCL 50 MG/ML IJ SOLN
INTRAMUSCULAR | Status: DC | PRN
  Administered 2021-01-08: 12.5 mg via INTRAVENOUS

## 2021-01-08 MED ORDER — PROMETHAZINE HCL 25 MG/ML IJ SOLN: 6.2500 mg | INTRAMUSCULAR | Status: DC | PRN

## 2021-01-08 MED ORDER — SODIUM CHLORIDE 0.9 % IV SOLN: INTRAVENOUS | Status: DC | PRN

## 2021-01-08 MED ORDER — ONDANSETRON HCL 4 MG/2ML IJ SOLN
INTRAMUSCULAR | Status: DC | PRN
  Administered 2021-01-08: 4 mg via INTRAVENOUS

## 2021-01-08 MED ORDER — PROPOFOL 500 MG/50ML IV EMUL
INTRAVENOUS | Status: DC | PRN
  Administered 2021-01-08: 75 ug/kg/min via INTRAVENOUS

## 2021-01-08 MED ORDER — LIDOCAINE-EPINEPHRINE 0.5 %-1:200000 IJ SOLN
INTRAMUSCULAR | Status: DC | PRN
  Administered 2021-01-08: 17 mL

## 2021-01-08 MED ORDER — HYDROXYZINE HCL 10 MG PO TABS
10.0000 mg | ORAL_TABLET | Freq: Three times a day (TID) | ORAL | Status: DC | PRN
  Administered 2021-01-08: 10 mg via ORAL
  Filled 2021-01-08: qty 1

## 2021-01-08 MED ORDER — FENTANYL CITRATE (PF) 100 MCG/2ML IJ SOLN
INTRAMUSCULAR | Status: AC
  Filled 2021-01-08: qty 2

## 2021-01-08 MED ORDER — DOXERCALCIFEROL 4 MCG/2ML IV SOLN
7.0000 ug | INTRAVENOUS | Status: DC
  Filled 2021-01-08: qty 4

## 2021-01-08 MED ORDER — OXYCODONE-ACETAMINOPHEN 5-325 MG PO TABS: 1.0000 | ORAL_TABLET | ORAL | Status: DC | PRN

## 2021-01-08 MED ORDER — LIDOCAINE HCL (PF) 1 % IJ SOLN
INTRAMUSCULAR | Status: AC
  Filled 2021-01-08: qty 30

## 2021-01-08 SURGICAL SUPPLY — 42 items
BAG DECANTER FOR FLEXI CONT (MISCELLANEOUS) ×3 IMPLANT
BIOPATCH RED 1 DISK 7.0 (GAUZE/BANDAGES/DRESSINGS) ×3 IMPLANT
CATH PALINDROME-P 19CM W/VT (CATHETERS) IMPLANT
CATH PALINDROME-P 23CM W/VT (CATHETERS) IMPLANT
CATH PALINDROME-P 28CM W/VT (CATHETERS) IMPLANT
CHLORAPREP W/TINT 26 (MISCELLANEOUS) ×3 IMPLANT
COVER PROBE W GEL 5X96 (DRAPES) IMPLANT
COVER SURGICAL LIGHT HANDLE (MISCELLANEOUS) ×3 IMPLANT
COVER WAND RF STERILE (DRAPES) ×3 IMPLANT
DERMABOND ADHESIVE PROPEN (GAUZE/BANDAGES/DRESSINGS) ×1
DERMABOND ADVANCED .7 DNX6 (GAUZE/BANDAGES/DRESSINGS) IMPLANT
DRAPE C-ARM 42X72 X-RAY (DRAPES) ×3 IMPLANT
DRAPE CHEST BREAST 15X10 FENES (DRAPES) ×3 IMPLANT
GAUZE 4X4 16PLY RFD (DISPOSABLE) ×3 IMPLANT
GAUZE SPONGE 4X4 12PLY STRL (GAUZE/BANDAGES/DRESSINGS) ×1 IMPLANT
GLOVE BIO SURGEON STRL SZ7.5 (GLOVE) ×3 IMPLANT
GLOVE SRG 8 PF TXTR STRL LF DI (GLOVE) ×2 IMPLANT
GLOVE SURG UNDER POLY LF SZ8 (GLOVE) ×2
GOWN STRL REUS W/ TWL LRG LVL3 (GOWN DISPOSABLE) ×4 IMPLANT
GOWN STRL REUS W/TWL LRG LVL3 (GOWN DISPOSABLE) ×4
KIT BASIN OR (CUSTOM PROCEDURE TRAY) ×3 IMPLANT
KIT MICROPUNCTURE NIT STIFF (SHEATH) ×1 IMPLANT
KIT PALINDROME-P 55CM (CATHETERS) IMPLANT
KIT TURNOVER KIT B (KITS) ×3 IMPLANT
NDL 18GX1X1/2 (RX/OR ONLY) (NEEDLE) ×2 IMPLANT
NDL HYPO 25GX1X1/2 BEV (NEEDLE) ×2 IMPLANT
NEEDLE 18GX1X1/2 (RX/OR ONLY) (NEEDLE) ×3 IMPLANT
NEEDLE HYPO 25GX1X1/2 BEV (NEEDLE) ×3 IMPLANT
NS IRRIG 1000ML POUR BTL (IV SOLUTION) ×3 IMPLANT
PACK SURGICAL SETUP 50X90 (CUSTOM PROCEDURE TRAY) ×3 IMPLANT
PAD ARMBOARD 7.5X6 YLW CONV (MISCELLANEOUS) ×6 IMPLANT
SUT ETHILON 3 0 PS 1 (SUTURE) ×3 IMPLANT
SUT VICRYL 4-0 PS2 18IN ABS (SUTURE) ×3 IMPLANT
SYR 10ML LL (SYRINGE) ×3 IMPLANT
SYR 20ML LL LF (SYRINGE) ×6 IMPLANT
SYR 5ML LL (SYRINGE) ×6 IMPLANT
SYR CONTROL 10ML LL (SYRINGE) ×3 IMPLANT
TAPE CLOTH SURG 4X10 WHT LF (GAUZE/BANDAGES/DRESSINGS) ×1 IMPLANT
TOWEL GREEN STERILE (TOWEL DISPOSABLE) ×6 IMPLANT
TOWEL GREEN STERILE FF (TOWEL DISPOSABLE) ×3 IMPLANT
WATER STERILE IRR 1000ML POUR (IV SOLUTION) ×3 IMPLANT
WIRE TORQFLEX AUST .018X40CM (WIRE) ×1 IMPLANT

## 2021-01-08 NOTE — Interval H&P Note (Signed)
History and Physical Interval Note:  01/08/2021 5:18 PM  [name]  has presented today for surgery, with the diagnosis of renal failure.  The various methods of treatment have been discussed with the patient and family. After consideration of risks, benefits and other options for treatment, the patient has consented to  Procedure(s): INSERTION OF DIALYSIS CATHETER, tunneled (N/A) as a surgical intervention.  The patient's history has been reviewed, patient examined, no change in status, stable for surgery.  I have reviewed the patient's chart and labs.  Questions were answered to the patient's satisfaction.     Deitra Mayo

## 2021-01-08 NOTE — Progress Notes (Signed)
Patient has a potassium of 6.4. paged attending

## 2021-01-08 NOTE — Progress Notes (Signed)
Oozing noted from under dressing at site reinforced. Reports pain to chest insertion site is better

## 2021-01-08 NOTE — Consult Note (Signed)
Renal Service Consult Note [facility]  [name] 01/08/2021 [name], MD Requesting Physician: Dr. Kurtis Bushman  Reason for Consult: ESRD pt w/ hyperkalemia and recent AVF surgery HPI: The patient is a 55 y.o. year-old w/ hx of DM2, CAD/ sp PCI/ MI, HTN, ESRD on HD had recent ulcer resection from his RUA AVF on 12/27/20. The hope was that there would still be enough AVF area w/ which to stick the needles for dialysis. However it turned out that HD couldn't be done successfully on the AVF after the surgery. So patient showed up for [facility] procedure yest but the K+ was high at 7.3. He was treated w/ temporizing meds and surgery was cancelled.  Temp HD cath was placed by Dr Oneida Alar and pt was admitted. He had HD overnight w/ low K+ bath. Today K+ is still high at 5.8 this am, repeated at 6.2. Asked to see for ESRD.    Pt seen in room. Had partial HD last Friday.  Didn't eat anything last night after HD (npo before procedure today). No SOB, cough or CP.    ROS  denies CP  no joint pain   no HA  no blurry vision  no rash  no diarrhea  no nausea/ vomiting    Past Medical History  Past Medical History:  Diagnosis Date  . Abscess of right foot   . Acute osteomyelitis of right foot ([place]) 05/15/2019  . Anemia 06/23/2019  . Cellulitis of right foot 05/22/2019  . Chest pain 08/23/2013  . Chills    at night - sometimes  . Complication of anesthesia   . Coronary artery disease   . Diabetes mellitus    controlled by diet  . 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 Diabetic foot ulcer- right second toe 05/14/2019  . Diabetic infection of right foot ([employer])   . Diabetic retinopathy associated with type 2 diabetes mellitus ([place]) 06/23/2019  . Eczema   . ESRD on hemodialysis ([place]) 07/18/2011  . Essential hypertension 10/18/2007   Qualifier: Diagnosis of  By: Loanne Drilling MD, Jacelyn Pi   . Fatigue    loss of fatigue  . FOOT ULCER, LEFT 06/29/2008   Qualifier: Diagnosis of  By:  Loanne Drilling MD, Sean A   . Gangrene of toe of right foot ([place])   . Headache(784.0)    Years ago  . Heart murmur    years ago  . History of blood transfusion   . Hyperglycemia 05/05/2015  . HYPERLIPIDEMIA 05/16/2008   Qualifier: Diagnosis of  By: Jenny Reichmann MD, Hunt Oris   . Hypoglycemia, unspecified 11/13/2008   Qualifier: Diagnosis of  By: Jenny Reichmann MD, Hunt Oris   . MRSA (methicillin resistant staph aureus) culture positive   . MRSA INFECTION 10/18/2007   Qualifier: Diagnosis of  By: Marca Ancona RMA, Lucy    . Myocardial infarction ([place])   . Obesity, unspecified 08/23/2013  . Osteomyelitis of great toe of right foot ([place]) 05/14/2019  . Other forms of retinal detachment(361.89) 06/23/2019  . PONV (postoperative nausea and vomiting)   . Poor circulation   . Secondary hyperparathyroidism ([place]) 06/23/2019  . Sepsis ([place]) 05/14/2019  . Type 2 diabetes mellitus ([place]) 08/23/2013  . Ulcer of right foot, with necrosis of bone ([place])   . Ulcer of right second toe with necrosis of bone [facility])    Past Surgical History  Past Surgical History:  Procedure Laterality Date  . ABDOMINAL AORTOGRAM N/A 05/17/2019   Procedure: ABDOMINAL AORTOGRAM;  Surgeon: Oneida Alar,  Jessy Oto, MD;  Location: [place] CV LAB;  Service: Cardiovascular;  Laterality: N/A;  . AMPUTATION TOE Right 05/14/2019   Procedure: PARTIAL AMPUTATION SECOND TOE RIGHT FOOT;  Surgeon: Evelina Bucy, DPM;  Location: [place];  Service: Podiatry;  Laterality: Right;  . AMPUTATION TOE Right 05/23/2019   Procedure: AMPUTATION OF SECOND TOE METATARSAL PHALANGEAL JOINT;  Surgeon: Evelina Bucy, DPM;  Location: [place];  Service: Podiatry;  Laterality: Right;  . AMPUTATION TOE Right 05/27/2019   Procedure: Amputation Toe;  Surgeon: Evelina Bucy, DPM;  Location: [place];  Service: Podiatry;  Laterality: Right;  right third toe  . APPLICATION OF WOUND VAC Right 05/27/2019   Procedure: Application Of Wound Vac;  Surgeon: Evelina Bucy, DPM;  Location: [place];  Service: Podiatry;   Laterality: Right;  . AV FISTULA PLACEMENT  06-18-11   Right brachiocephalic AVF  . BONE BIOPSY Right 05/14/2019   Procedure: OPEN SUPERFICIAL BONE BIOPSY GREAT TOE;  Surgeon: Evelina Bucy, DPM;  Location: [place];  Service: Podiatry;  Laterality: Right;  . CORONARY BALLOON ANGIOPLASTY N/A 09/24/2020   Procedure: CORONARY BALLOON ANGIOPLASTY;  Surgeon: Martinique, Peter M, MD;  Location: [place] CV LAB;  Service: Cardiovascular;  Laterality: N/A;  . CORONARY STENT INTERVENTION N/A 09/24/2020   Procedure: CORONARY STENT INTERVENTION;  Surgeon: Martinique, Peter M, MD;  Location: [place] CV LAB;  Service: Cardiovascular;  Laterality: N/A;  . EYE SURGERY     left eye for Laser, diabetic retinopathy  . FISTULOGRAM Right 11/06/2011   Procedure: FISTULOGRAM;  Surgeon: Conrad Jayton, MD;  Location: [facility] CATH LAB;  Service: Cardiovascular;  Laterality: Right;  . HEMATOMA EVACUATION Right Feb. 25, 2014  . HEMATOMA EVACUATION Right 12/14/2012   Procedure: EVACUATION HEMATOMA;  Surgeon: Conrad Ketchum, MD;  Location: [place];  Service: Vascular;  Laterality: Right;  . INCISION AND DRAINAGE Right 05/23/2019   Procedure: INCISION AND DRAINAGE OF RIGHT FOOT DEEP SPACE ABSCESS;  Surgeon: Evelina Bucy, DPM;  Location: [place];  Service: Podiatry;  Laterality: Right;  . INSERTION OF DIALYSIS CATHETER  12/14/2012   Procedure: INSERTION OF DIALYSIS CATHETER;  Surgeon: Conrad Uniondale, MD;  Location: [place];  Service: Vascular;;  . INTRAVASCULAR PRESSURE WIRE/FFR STUDY N/A 09/24/2020   Procedure: INTRAVASCULAR PRESSURE WIRE/FFR STUDY;  Surgeon: Martinique, Peter M, MD;  Location: [place] CV LAB;  Service: Cardiovascular;  Laterality: N/A;  . IRRIGATION AND DEBRIDEMENT FOOT Right 05/25/2019   Procedure: INCISION AND DRAINAGE FOOT;  Surgeon: Evelina Bucy, DPM;  Location: [place];  Service: Podiatry;  Laterality: Right;  . IRRIGATION AND DEBRIDEMENT FOOT Right 05/27/2019   Procedure: IRRIGATION AND DEBRIDEMENT FOOT;  Surgeon: Evelina Bucy, DPM;  Location: [place];  Service: Podiatry;  Laterality: Right;  . LEFT HEART CATH AND CORONARY ANGIOGRAPHY N/A 09/24/2020   Procedure: LEFT HEART CATH AND CORONARY ANGIOGRAPHY;  Surgeon: Martinique, Peter M, MD;  Location: [place] CV LAB;  Service: Cardiovascular;  Laterality: N/A;  . LOWER EXTREMITY ANGIOGRAPHY Bilateral 05/17/2019   Procedure: LOWER EXTREMITY ANGIOGRAPHY;  Surgeon: Elam Dutch, MD;  Location: [place] CV LAB;  Service: Cardiovascular;  Laterality: Bilateral;  . REVISON OF ARTERIOVENOUS FISTULA Right 12/14/2012   Procedure: REVISON OF upper arm ARTERIOVENOUS FISTULA using 85mx10cm gortex graft;  Surgeon: BConrad Horse Shoe MD;  Location[place]  Service: Vascular;  Laterality: Right;  . REVISON OF ARTERIOVENOUS FISTULA Right 12/27/2020   Procedure: RIGHT UPPER EXTREMITY ARTERIOVENOUS  FISTULA REVISiON;  Surgeon: Cherre Robins, MD;  Location: [facility] OR;  Service: Vascular;  Laterality: Right;  PERIPHERAL NERVE BLOCK  . RIGHT/LEFT HEART CATH AND CORONARY ANGIOGRAPHY N/A 08/07/2020   Procedure: RIGHT/LEFT HEART CATH AND CORONARY ANGIOGRAPHY;  Surgeon: Martinique, Peter M, MD;  Location: [place] CV LAB;  Service: Cardiovascular;  Laterality: N/A;  . SOFT TISSUE MASS EXCISION     Right arm, Left leg  for MRSA infection   Family History  Family History  Problem Relation Age of Onset  . Diabetes Mother    Social History  reports that he has never smoked. He has never used smokeless tobacco. He reports that he does not drink alcohol and does not use drugs. Allergies  Allergies  Allergen Reactions  . Atorvastatin     Other reaction(s): Muscle Pain  . Rosuvastatin     Other reaction(s): Muscle Pain  . Amoxicillin Itching    Did it involve swelling of the face/tongue/throat, SOB, or low BP? Unknown Did it involve sudden or severe rash/hives, skin peeling, or any reaction on the inside of your mouth or nose? Unknown Did you need to seek medical attention at a hospital or  doctor's office? Unknown When did it last happen?20 years ago If all above answers are "NO", may proceed with cephalosporin use.   Marland Kitchen Hydromorphone Itching    Patient states he may take with benadryl. Makes feet itch.  Marland Kitchen Percocet [Oxycodone-Acetaminophen] Itching and Other (See Comments)    Legs only. Itching and burning feeling.  . Chlorhexidine Hives    Patient has blistering  . Gabapentin Nausea And Vomiting    Stomach issues    Home medications Prior to Admission medications   Medication Sig Start Date End Date Taking? Authorizing Provider  acetaminophen-codeine (TYLENOL #3) 300-30 MG tablet Take 1 tablet by mouth every 6 (six) hours as needed. Patient taking differently: Take 1 tablet by mouth every 6 (six) hours as needed for moderate pain. 12/27/20  Yes Dagoberto Ligas, PA-C  amLODipine (NORVASC) 10 MG tablet Take 10 mg by mouth at bedtime.  08/03/20  Yes [provider]  aspirin EC 81 MG tablet Take 1 tablet (81 mg total) by mouth daily. 06/28/19  Yes Wellington Hampshire, MD  BRILINTA 90 MG TABS tablet Take 90 mg by mouth 2 (two) times daily. 12/26/20  Yes [provider]  hydrALAZINE (APRESOLINE) 50 MG tablet Take 50 mg by mouth 3 (three) times daily. 12/18/20  Yes [provider]  isosorbide dinitrate (ISORDIL) 20 MG tablet Take 20 mg by mouth 3 (three) times daily. 08/24/20  Yes [provider]  lidocaine-prilocaine (EMLA) cream Apply 1 application topically as needed. Patient taking differently: Apply 1 application topically as needed (port access). 01/02/21  Yes Cherre Robins, MD  propranolol (INDERAL) 80 MG tablet Take 1 tablet (80 mg total) by mouth 2 (two) times daily. 09/26/20  Yes Hongalgi, Lenis Dickinson, MD  sevelamer carbonate (RENVELA) 800 MG tablet Take 1,600 mg by mouth 3 (three) times daily with meals. 07/25/20  Yes [provider]  blood glucose meter kit and supplies Dispense based on patient and insurance preference. Use up to  four times daily as directed. (FOR ICD-10 E10.9, E11.9). 05/18/19   Mikhail, Velta Addison, DO  blood glucose meter kit and supplies Dispense based on patient and insurance preference. Use up to four times daily as directed. (FOR ICD-9 250.00, 250.01). 05/18/19   Cristal Ford, DO  Continuous Blood Gluc Sensor (FREESTYLE LIBRE 14  DAY SENSOR) MISC See admin instructions. 06/20/19   [provider]  glucose blood (ACCU-CHEK AVIVA PLUS) test strip Use as instructed 05/18/19   Cristal Ford, DO  hydrALAZINE (APRESOLINE) 25 MG tablet Take 1 tablet (25 mg total) by mouth every 8 (eight) hours. Do not take morning of dialysis. Patient not taking: Reported on 01/07/2021 08/09/20   Freida Busman, MD  Lancet Devices [facility]) lancets Use as instructed 05/18/19   Cristal Ford, DO  loratadine (CLARITIN) 10 MG tablet Take 10 mg by mouth daily as needed for allergies.  11/06/19  [provider]     Vitals:   01/07/21 2303 01/08/21 0034 01/08/21 0440 01/08/21 0744  BP: (!) 178/81 (!) 173/82 (!) 169/86 (!) 167/89  Pulse: 77 68 68 69  Resp: _0 Temp: 98.3 F (36.8 C) 98 F (36.7 C) 98.3 F (36.8 C) 98.1 F (36.7 C)  TempSrc: Oral Oral Oral Oral  SpO2: 95% 96% 95% 93%  Weight: 89.5 kg  89.8 kg   Height: _1  (1.778 m)      Exam Gen alert, no distress No rash, cyanosis or gangrene Sclera anicteric, throat clear  No jvd or bruits Chest clear bilat to bases, no rales/ wheezing RRR no MRG Abd soft ntnd no mass or ascites +bs GU normal MS no joint effusions or deformity Ext 1+ bilat pretib edema, no wounds or ulcers Neuro is alert, Ox 3 , nf  RUA AVF , healing wound w/ sutures, + bruit   Temp HD cath R fem position       OP HD: [place] MWF  4h  450/800  86kg  2/2.25 bath  Hep none  RUA AVF  - hect 7 ug tiw  - mircera  200 q2 last 12/31/20    Assessment/ Plan: 1. Hyperkalemia - surprised K+ is still. Will need another HD today, again w/ low K+ bath.   2. ESRD - on HD TTS. Extra HD today off schedule. Get f/u K+ 2-4 hr after HD today.  3. Recent R AVF surgery w/ ulcer resection - functioning AVF but unable to use now, sp infiltration. Needs TDC when K+ is controlled.  4. DM2 5. Anemia ckd- Hb 8.4, next esa due 3/28 6. MBD ckd - cont vdra, any binders 7. CAD hx PCI 8. PAD - hx R partial foot amp      Rob Evelyna Folker  MD 01/08/2021, 10:51 AM  Recent Labs  Lab 01/07/21 2316 01/08/21 0839  WBC 5.8 5.9  HGB 7.8* 8.4*   Recent Labs  Lab 01/07/21 1335 01/08/21 0434 01/08/21 0839  K 6.8* 5.8* 6.4*  BUN 81*  --  65*  CREATININE 11.79*  --  9.81*  CALCIUM 9.7  --  9.1

## 2021-01-08 NOTE — Procedures (Signed)
   I was present at this dialysis session, have reviewed the session itself and made  appropriate changes Kelly Splinter MD [place] pager [phone]   01/08/2021, 2:40 PM

## 2021-01-08 NOTE — Op Note (Signed)
    NAME: JAYESH KUCHARSKI    MRN: [id number] DOB: December 12, 1965    DATE OF OPERATION: 01/08/2021  PREOP DIAGNOSIS:    End-stage renal disease  POSTOP DIAGNOSIS:    Same  PROCEDURE:    Ultrasound-guided placement of left IJ 28 cm tunneled dialysis catheter Removal of right temporary femoral catheter  SURGEON: Judeth Cornfield. Scot Dock, MD  ASSIST: None  ANESTHESIA: Local with sedation  EBL: Minimal  INDICATIONS:    [name] is a 55 y.o. male who presented with a poorly functioning fistula and needed a catheter placed.  However he was hyperkalemic and required placement of a temporary femoral catheter.  When she had undergone dialysis and is potassium had improved he presents for placement of a tunneled dialysis catheter.  FINDINGS:   Patent left IJ  TECHNIQUE:   The patient was taken to the operating room and sedated by anesthesia.  The neck and upper chest were prepped and draped in the usual sterile fashion.  Under ultrasound guidance, after the skin was anesthetized, I cannulated the right IJ with a micropuncture needle on 3 occasions but each time I had a hard time threading the wire.  I was concerned there might be a distal obstruction.  Therefore rather than persist I elected to place the catheter on the left side.  Under ultrasound guidance, after the skin was anesthetized, I cannulated the left IJ with a micropuncture needle and a micropuncture sheath was introduced over a wire.  I then advanced the J-wire into the right atrium.  Next the exit site for the catheter was selected and the skin anesthetized between the 2 areas.  The cath was brought through the tunnel.  Next the tract over the wire was dilated sequentially with the dilators and the dilator and peel-away sheath were advanced over the wire and the wire and dilator removed.  The catheter was passed through the peel-away sheath and positioned in the SVC.  Both ports withdrew easily with and flushed with heparinized Saline  and filled with concentrated heparin.  The catheter was secured at its exit site with a 3-0 nylon suture.  The IJ cannulation site was closed with a 4-0 subcuticular stitch.  Sterile dressing was applied.  Attention was then turned to removal of the right femoral catheter.  The sutures were cut.  The catheter was removed.  Pressure was held for hemostasis.  No immediate complications were noted.  Deitra Mayo, MD, FACS [facility]  DATE OF DICTATION:   01/08/2021

## 2021-01-08 NOTE — Anesthesia Postprocedure Evaluation (Addendum)
Anesthesia Post Note  Patient: TIDUS UPCHURCH  Procedure(s) Performed: INSERTION OF LEFT INTERNAL JUGULAR DIALYSIS CATHETER, tunneled (Left Chest) REMOVAL OF TEMPORARY DIALYSIS CATHETER, LEFT FEMORAL VEIN (Right Leg Upper)     Patient location during evaluation: PACU Anesthesia Type: MAC Level of consciousness: awake and alert, patient cooperative and oriented Pain management: pain level controlled (pain at L chest catheter site, relieved with pain med) Vital Signs Assessment: post-procedure vital signs reviewed and stable Respiratory status: nonlabored ventilation, spontaneous breathing and respiratory function stable Cardiovascular status: blood pressure returned to baseline and stable Postop Assessment: no apparent nausea or vomiting Anesthetic complications: no   No complications documented.  Last Vitals:  Vitals:   01/08/21 2006 01/08/21 2027  BP: (!) 179/86 (!) 150/79  Pulse: 68 69  Resp: 16 20  Temp:  37 C  SpO2: 97% 90%    Last Pain:  Vitals:   01/08/21 2027  TempSrc: Oral  PainSc:                  Jermale Crass,E. Yamari Ventola

## 2021-01-08 NOTE — Progress Notes (Signed)
Patient arrived from dialysis with bleeding noted from temporary groin hemodialysis access. Dressing saturated. Dressing removed. Clot noted at the insertion site of the catheter. Catheter cleansed and covered with gauze/tegaderm/medipore dressing. Dr. Sidney Ace notified of bleeding from site. Holding heparin and asa for tonight. Will continue to monitor dressing. Patient educated on keeping right leg from excessive movements. Patient noted with wounds on amputation site to right foot and discoloration to 3rd-5th digits on left foot. Covered with foam dressing. Surgical site for AVG noted to right upper arm. Positive bruit and thrill. Staples intact.

## 2021-01-08 NOTE — H&P (View-Only) (Signed)
  Progress Note    01/08/2021 7:33 AM 1 Day Post-Op  Subjective:  Right groin pain   Vitals:   01/08/21 0034 01/08/21 0440  BP: (!) 173/82 (!) 169/86  Pulse: 68 68  Resp: 16 18  Temp: 98 F (36.7 C) 98.3 F (36.8 C)  SpO2: 96% 95%   Physical Exam: Cardiac:  regular Lungs: non labored Incisions: right AV fistula sutures intact, clean and dry. Dry dressings in place Extremities: well perfused and warm. Right femoral temporary dialysis catheter with bleeding around access site. Large clot present. I did not fully remove dressings or the clot. Neurologic: alert and oriented  CBC    Component Value Date/Time   WBC 5.8 01/07/2021 2316   RBC 2.84 (L) 01/07/2021 2316   HGB 7.8 (L) 01/07/2021 2316   HGB 8.6 (L) 06/28/2019 1042   HCT 25.5 (L) 01/07/2021 2316   HCT 27.8 (L) 06/28/2019 1042   PLT 244 01/07/2021 2316   PLT 308 06/28/2019 1042   MCV 89.8 01/07/2021 2316   MCV 85 06/28/2019 1042   MCH 27.5 01/07/2021 2316   MCHC 30.6 01/07/2021 2316   RDW 19.9 (H) 01/07/2021 2316   RDW 17.3 (H) 06/28/2019 1042   LYMPHSABS 0.8 09/24/2020 0431   MONOABS 0.6 09/24/2020 0431   EOSABS 0.2 09/24/2020 0431   BASOSABS 0.0 09/24/2020 0431    BMET    Component Value Date/Time   NA 138 01/07/2021 1335   NA 141 06/28/2019 1042   K 5.8 (H) 01/08/2021 0434   CL 97 (L) 01/07/2021 1335   CO2 24 01/07/2021 1335   GLUCOSE 94 01/07/2021 1335   BUN 81 (H) 01/07/2021 1335   BUN 28 (H) 06/28/2019 1042   CREATININE 11.79 (H) 01/07/2021 1335   CALCIUM 9.7 01/07/2021 1335   CALCIUM 7.9 (L) 08/31/2009 1524   GFRNONAA 5 (L) 01/07/2021 1335   GFRAA 6 (L) 08/05/2019 0537    INR    Component Value Date/Time   INR 1.14 06/18/2011 0500     Intake/Output Summary (Last 24 hours) at 01/08/2021 [id number] Last data filed at 01/07/2021 2225 Gross per 24 hour  Intake --  Output 2501 ml  Net -2501 ml     Assessment/Plan:  55 y.o. male is s/p insertion of right femoral venous temporary dialysis  catheter 1 Day Post-Op. Bleeding overnight from catheter access site. Catheter worked well at dialysis yesterday. I left dressings as to not disrupt hemostasis. He is planned to go to OR this afternoon for insertion of TDC. Remains hyperkalemic 5.8. Keep NPO. Will repeat labs prior to going to [place], [place] [facility] [phone] 01/08/2021 7:33 AM   I have seen and evaluated the patient. I agree with the PA note as documented above.  Infiltration of right arm AV fistula after recent revision.  Had temporary tunneled dialysis catheter placed in the right groin last night and did get dialysis.  Potassium improved to 5.8 this am.  Plan is to proceed to the OR today now that it is safe for anesthesia for tunneled dialysis catheter this afternoon.  Please keep n.p.o.  Marty Heck, MD [facility] [place] Office: [phone]

## 2021-01-08 NOTE — Plan of Care (Signed)

## 2021-01-08 NOTE — Anesthesia Preprocedure Evaluation (Addendum)
Anesthesia Evaluation    History of Anesthesia Complications (+) PONV  Airway Mallampati: II  TM Distance: >3 FB Neck ROM: Full    Dental no notable dental hx.    Pulmonary neg pulmonary ROS,    Pulmonary exam normal breath sounds clear to auscultation       Cardiovascular hypertension, Pt. on medications and Pt. on home beta blockers + CAD, + Past MI, + Cardiac Stents (12/21 on Brilenta ) and + Peripheral Vascular Disease  Normal cardiovascular exam Rhythm:Regular Rate:Normal     Neuro/Psych  Headaches, negative psych ROS   GI/Hepatic negative GI ROS, Neg liver ROS,   Endo/Other  diabetes, Poorly Controlled, Type 2  Renal/GU CRF and DialysisRenal disease (M/W/F)  negative genitourinary   Musculoskeletal negative musculoskeletal ROS (+)   Abdominal   Peds  Hematology  (+) anemia ,   Anesthesia Other Findings   Reproductive/Obstetrics                           Anesthesia Physical Anesthesia Plan  ASA: IV  Anesthesia Plan: MAC   Post-op Pain Management:    Induction: Intravenous  PONV Risk Score and Plan: 2 and Ondansetron, Propofol infusion and Treatment may vary due to age or medical condition  Airway Management Planned: Simple Face Mask, Natural Airway and Nasal Cannula  Additional Equipment: None  Intra-op Plan:   Post-operative Plan:   Informed Consent: I have reviewed the patients History and Physical, chart, labs and discussed the procedure including the risks, benefits and alternatives for the proposed anesthesia with the patient or authorized representative who has indicated his/her understanding and acceptance.     Dental advisory given  Plan Discussed with: CRNA  Anesthesia Plan Comments: (Lab Results      Component                Value               Date                      WBC                      5.9                 01/08/2021                HGB                       8.5 (L)             01/08/2021                HCT                      25.0 (L)            01/08/2021                MCV                      89.5                01/08/2021                PLT                      239  01/08/2021           Lab Results      Component                Value               Date                      NA                       136                 01/08/2021                K                        5.8 (H)             01/08/2021                CO2                      21 (L)              01/08/2021                GLUCOSE                  142 (H)             01/08/2021                BUN                      66 (H)              01/08/2021                CREATININE               10.30 (H)           01/08/2021                CALCIUM                  9.1                 01/08/2021                GFRNONAA                 6 (L)               01/08/2021                GFRAA                    6 (L)               08/05/2019          )       Anesthesia Quick Evaluation

## 2021-01-08 NOTE — Progress Notes (Signed)
PROGRESS NOTE    [name]  [id number] DOB: 1965/12/10 DOA: 01/07/2021 PCP: [name], MD    Brief Narrative:  23 black male with history of coronary artery disease, three-vessel disease, PAD, diabetes mellitus type 2 was brought in electively to [facility] for exchange of his malfunctioning right arm fistula-tells me that he has not been able to cannulate it properly over the past 3 attempts-he only had 1-1/2-hour of HD on Friday . He was found to have a potassium of 7.8 -given calcium gluconate insulin dextrose to temporize. He was treated w/ temporizing meds and surgery was cancelled.  Temp HD cath was placed by Dr Oneida Alar and pt was admitted. He had HD overnight w/ low K+ bath.    3/22-k UP TO 6.4, spoke to nephrology will dialyze this patient today.  He was planning to go to the OR by vascular but unfortunately he needs to be dialyzed first.   Consultants:   Vascular, nephrology  Procedures: Hemodialysis  Antimicrobials:       Subjective: Denies dizziness, shortness of breath, chest pain.  This has nausea because he takes his medications on empty stomach.  Objective: Vitals:   01/08/21 0034 01/08/21 0440 01/08/21 0744 01/08/21 1118  BP: (!) 173/82 (!) 169/86 (!) 167/89 (!) 171/80  Pulse: 68 68 69 67  Resp: '16 18 19 19  '$ Temp: 98 F (36.7 C) 98.3 F (36.8 C) 98.1 F (36.7 C) 98.2 F (36.8 C)  TempSrc: Oral Oral Oral Oral  SpO2: 96% 95% 93% 98%  Weight:  89.8 kg    Height:        Intake/Output Summary (Last 24 hours) at 01/08/2021 1405 Last data filed at 01/07/2021 2225 Gross per 24 hour  Intake --  Output 2501 ml  Net -2501 ml   Filed Weights   01/07/21 0801 01/07/21 2303 01/08/21 0440  Weight: 88.5 kg 89.5 kg 89.8 kg    Examination:  General exam: Appears calm and comfortable  Respiratory system: Clear to auscultation. Respiratory effort normal. Cardiovascular system: S1 & S2 heard, RRR. No JVD, murmurs, rubs, gallops or clicks.   Gastrointestinal system: Abdomen is nondistended, soft and nontender. Normal bowel sounds heard. Central nervous system: Alert and oriented.  Grossly intact Extremities: No edema Skin: Warm and dry Psychiatry: Judgement and insight appear normal. Mood & affect appropriate.     Data Reviewed: I have personally reviewed following labs and imaging studies  CBC: Recent Labs  Lab 01/07/21 1007 01/07/21 2316 01/08/21 0839  WBC  --  5.8 5.9  HGB 10.5* 7.8* 8.4*  HCT 31.0* 25.5* 26.5*  MCV  --  89.8 89.5  PLT  --  244 [id number]   Basic Metabolic Panel: Recent Labs  Lab 01/07/21 1007 01/07/21 1017 01/07/21 1335 01/07/21 2316 01/08/21 0434 01/08/21 0839  NA 135 136 138  --   --  136  K 7.3* 7.2* 6.8* 5.5* 5.8* 6.4*  CL 102 94* 97*  --   --  100  CO2  --  25 24  --   --  21*  GLUCOSE 195* 191* 94  --   --  189*  BUN 82* 79* 81*  --   --  65*  CREATININE 12.00* 11.88* 11.79*  --   --  9.81*  CALCIUM  --  9.6 9.7  --   --  9.1   GFR: Estimated Creatinine Clearance: 9.6 mL/min (A) (by C-G formula based on SCr of 9.81 mg/dL (H)). Liver Function Tests: Recent  Labs  Lab 01/08/21 0839  AST 24  ALT 20  ALKPHOS 167*  BILITOT 1.7*  PROT 6.8  ALBUMIN 3.3*   No results for input(s): LIPASE, AMYLASE in the last 168 hours. No results for input(s): AMMONIA in the last 168 hours. Coagulation Profile: No results for input(s): INR, PROTIME in the last 168 hours. Cardiac Enzymes: No results for input(s): CKTOTAL, CKMB, CKMBINDEX, TROPONINI in the last 168 hours. BNP (last 3 results) No results for input(s): PROBNP in the last 8760 hours. HbA1C: No results for input(s): HGBA1C in the last 72 hours. CBG: Recent Labs  Lab 01/07/21 0854 01/07/21 1109 01/07/21 1239 01/07/21 1456 01/08/21 1132  GLUCAP 200* 176* 135* 100* 154*   Lipid Profile: No results for input(s): CHOL, HDL, LDLCALC, TRIG, CHOLHDL, LDLDIRECT in the last 72 hours. Thyroid Function Tests: No results for input(s):  TSH, T4TOTAL, FREET4, T3FREE, THYROIDAB in the last 72 hours. Anemia Panel: No results for input(s): VITAMINB12, FOLATE, FERRITIN, TIBC, IRON, RETICCTPCT in the last 72 hours. Sepsis Labs: No results for input(s): PROCALCITON, LATICACIDVEN in the last 168 hours.  Recent Results (from the past 240 hour(s))  SARS Coronavirus 2 by RT PCR (hospital order, performed in [facility] hospital lab) Nasopharyngeal Nasopharyngeal Swab     Status: None   Collection Time: 01/07/21  7:41 AM   Specimen: Nasopharyngeal Swab  Result Value Ref Range Status   SARS Coronavirus 2 NEGATIVE NEGATIVE Final    Comment: (NOTE) SARS-CoV-2 target nucleic acids are NOT DETECTED.  The SARS-CoV-2 RNA is generally detectable in upper and lower respiratory specimens during the acute phase of infection. The lowest concentration of SARS-CoV-2 viral copies this assay can detect is 250 copies / mL. A negative result does not preclude SARS-CoV-2 infection and should not be used as the sole basis for treatment or other patient management decisions.  A negative result may occur with improper specimen collection / handling, submission of specimen other than nasopharyngeal swab, presence of viral mutation(s) within the areas targeted by this assay, and inadequate number of viral copies (<250 copies / mL). A negative result must be combined with clinical observations, patient history, and epidemiological information.  Fact Sheet for Patients:   StrictlyIdeas.no  Fact Sheet for Healthcare Providers: BankingDealers.co.za  This test is not yet approved or  cleared by the [place] FDA and has been authorized for detection and/or diagnosis of SARS-CoV-2 by FDA under an Emergency Use Authorization (EUA).  This EUA will remain in effect (meaning this test can be used) for the duration of the COVID-19 declaration under Section 564(b)(1) of the Act, 21 U.S.C. section  360bbb-3(b)(1), unless the authorization is terminated or revoked sooner.  Performed at [facility], [place] [address] [place], [place] [address]   Surgical pcr screen     Status: None   Collection Time: 01/08/21  3:33 AM   Specimen: Nasal Mucosa; Nasal Swab  Result Value Ref Range Status   MRSA, PCR NEGATIVE NEGATIVE Final   Staphylococcus aureus NEGATIVE NEGATIVE Final    Comment: (NOTE) The Xpert SA Assay (FDA approved for NASAL specimens in patients 32 years of age and older), is one component of a comprehensive surveillance program. It is not intended to diagnose infection nor to guide or monitor treatment. Performed at [facility], [place] [address], [place], [place] [address]          Radiology Studies: DG Abd 1 View  Result Date: 01/07/2021 CLINICAL DATA:  Femoral central line  placement EXAM: ABDOMEN - 1 VIEW COMPARISON:  Radiograph 06/08/2011 FINDINGS: A right femoral line courses towards midline prior redirecting inferiorly towards the pelvis, possibly directed into the internal iliac. Recommend repositioning prior to use. Moderate colonic stool burden. No high-grade obstructive bowel gas pattern. No suspicious abdominal calcifications. Question tubing versus a fecal management system in the deep pelvis, projecting over the level of the rectal vault. Telemetry leads overlie the chest. No acute or worrisome osseous abnormalities. IMPRESSION: 1. Right femoral line courses towards midline prior redirecting inferiorly towards the pelvis, possibly directed into the internal iliac. Recommend repositioning prior to use. Electronically Signed   By: Lovena Le M.D.   On: 01/07/2021 23:36        Scheduled Meds:  amLODipine  10 mg Oral QHS   aspirin EC  81 mg Oral Daily   [START ON 01/09/2021] doxercalciferol  7 mcg Intravenous Q M,W,F-HD   heparin  2,000 Units Dialysis Once in dialysis   heparin  5,000 Units Subcutaneous Q8H   hydrALAZINE  50 mg Oral TID    isosorbide dinitrate  20 mg Oral TID   propranolol  80 mg Oral BID   sevelamer carbonate  1,600 mg Oral TID WC   sodium chloride flush  3 mL Intravenous Q12H   ticagrelor  90 mg Oral BID   Continuous Infusions:  vancomycin      Assessment & Plan:   Active Problems:   Dialysis AV fistula malfunction, initial encounter ([facility])   Malfunctioning right upper arm AV fistula -s/p insertion of Rt femoral venous temporary dialysis cath day 1 post op -Plan to go to OR today for insertion of TDC, however with K up , needs HD today.  ESRD MWF  Got HD yesterday and today since K elevated. 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  Dispo: The patient is from: Home              Anticipated d/c is to: Home              Patient currently is not medically stable to d/c.   Difficult to place patient No            LOS: 1 day   Time spent: 35 minutes with more than 50% on [place], MD [facility] Pager [phone]  If 7PM-7AM, please contact night-coverage 01/08/2021, 2:05 PM prog

## 2021-01-08 NOTE — Transfer of Care (Signed)
Immediate Anesthesia Transfer of Care Note  Patient: [name]  Procedure(s) Performed: INSERTION OF LEFT INTERNAL JUGULAR DIALYSIS CATHETER, tunneled (Left Chest) REMOVAL OF TEMPORARY DIALYSIS CATHETER, LEFT FEMORAL VEIN (Right Leg Upper)  Patient Location: PACU  Anesthesia Type:MAC  Level of Consciousness: awake, alert  and patient cooperative  Airway & Oxygen Therapy: Patient Spontanous Breathing  Post-op Assessment: Report given to RN and Post -op Vital signs reviewed and stable  Post vital signs: Reviewed and stable  Last Vitals:  Vitals Value Taken Time  BP 168/85 01/08/21 1906  Temp    Pulse 66   Resp 18 01/08/21 1907  SpO2 94   Vitals shown include unvalidated device data.  Last Pain:  Vitals:   01/08/21 1708  TempSrc: Oral  PainSc:       Patients Stated Pain Goal: 3 (36/62/94 7654)  Complications: No complications documented.

## 2021-01-08 NOTE — Progress Notes (Addendum)
  Progress Note    01/08/2021 7:33 AM 1 Day Post-Op  Subjective:  Right groin pain   Vitals:   01/08/21 0034 01/08/21 0440  BP: (!) 173/82 (!) 169/86  Pulse: 68 68  Resp: 16 18  Temp: 98 F (36.7 C) 98.3 F (36.8 C)  SpO2: 96% 95%   Physical Exam: Cardiac:  regular Lungs: non labored Incisions: right AV fistula sutures intact, clean and dry. Dry dressings in place Extremities: well perfused and warm. Right femoral temporary dialysis catheter with bleeding around access site. Large clot present. I did not fully remove dressings or the clot. Neurologic: alert and oriented  CBC    Component Value Date/Time   WBC 5.8 01/07/2021 2316   RBC 2.84 (L) 01/07/2021 2316   HGB 7.8 (L) 01/07/2021 2316   HGB 8.6 (L) 06/28/2019 1042   HCT 25.5 (L) 01/07/2021 2316   HCT 27.8 (L) 06/28/2019 1042   PLT 244 01/07/2021 2316   PLT 308 06/28/2019 1042   MCV 89.8 01/07/2021 2316   MCV 85 06/28/2019 1042   MCH 27.5 01/07/2021 2316   MCHC 30.6 01/07/2021 2316   RDW 19.9 (H) 01/07/2021 2316   RDW 17.3 (H) 06/28/2019 1042   LYMPHSABS 0.8 09/24/2020 0431   MONOABS 0.6 09/24/2020 0431   EOSABS 0.2 09/24/2020 0431   BASOSABS 0.0 09/24/2020 0431    BMET    Component Value Date/Time   NA 138 01/07/2021 1335   NA 141 06/28/2019 1042   K 5.8 (H) 01/08/2021 0434   CL 97 (L) 01/07/2021 1335   CO2 24 01/07/2021 1335   GLUCOSE 94 01/07/2021 1335   BUN 81 (H) 01/07/2021 1335   BUN 28 (H) 06/28/2019 1042   CREATININE 11.79 (H) 01/07/2021 1335   CALCIUM 9.7 01/07/2021 1335   CALCIUM 7.9 (L) 08/31/2009 1524   GFRNONAA 5 (L) 01/07/2021 1335   GFRAA 6 (L) 08/05/2019 0537    INR    Component Value Date/Time   INR 1.14 06/18/2011 0500     Intake/Output Summary (Last 24 hours) at 01/08/2021 [id number] Last data filed at 01/07/2021 2225 Gross per 24 hour  Intake --  Output 2501 ml  Net -2501 ml     Assessment/Plan:  55 y.o. male is s/p insertion of right femoral venous temporary dialysis  catheter 1 Day Post-Op. Bleeding overnight from catheter access site. Catheter worked well at dialysis yesterday. I left dressings as to not disrupt hemostasis. He is planned to go to OR this afternoon for insertion of TDC. Remains hyperkalemic 5.8. Keep NPO. Will repeat labs prior to going to [place], [place] [facility] [phone] 01/08/2021 7:33 AM   I have seen and evaluated the patient. I agree with the PA note as documented above.  Infiltration of right arm AV fistula after recent revision.  Had temporary tunneled dialysis catheter placed in the right groin last night and did get dialysis.  Potassium improved to 5.8 this am.  Plan is to proceed to the OR today now that it is safe for anesthesia for tunneled dialysis catheter this afternoon.  Please keep n.p.o.  Marty Heck, MD [facility] [place] Office: [phone]

## 2021-01-09 ENCOUNTER — Encounter (HOSPITAL_COMMUNITY): Payer: Self-pay | Admitting: Vascular Surgery

## 2021-01-09 DIAGNOSIS — I10 Essential (primary) hypertension: Secondary | ICD-10-CM

## 2021-01-09 DIAGNOSIS — T82590A Other mechanical complication of surgically created arteriovenous fistula, initial encounter: Secondary | ICD-10-CM | POA: Diagnosis not present

## 2021-01-09 DIAGNOSIS — E875 Hyperkalemia: Secondary | ICD-10-CM | POA: Diagnosis not present

## 2021-01-09 DIAGNOSIS — N186 End stage renal disease: Secondary | ICD-10-CM

## 2021-01-09 DIAGNOSIS — E1129 Type 2 diabetes mellitus with other diabetic kidney complication: Secondary | ICD-10-CM

## 2021-01-09 LAB — CBC
HCT: 24.8 % — ABNORMAL LOW (ref 39.0–52.0)
Hemoglobin: 7.5 g/dL — ABNORMAL LOW (ref 13.0–17.0)
MCH: 27.7 pg (ref 26.0–34.0)
MCHC: 30.2 g/dL (ref 30.0–36.0)
MCV: 91.5 fL (ref 80.0–100.0)
Platelets: 214 10*3/uL (ref 150–400)
RBC: 2.71 MIL/uL — ABNORMAL LOW (ref 4.22–5.81)
RDW: 20.3 % — ABNORMAL HIGH (ref 11.5–15.5)
WBC: 5.8 10*3/uL (ref 4.0–10.5)
nRBC: 0 % (ref 0.0–0.2)

## 2021-01-09 LAB — BASIC METABOLIC PANEL
Anion gap: 13 (ref 5–15)
BUN: 60 mg/dL — ABNORMAL HIGH (ref 6–20)
CO2: 24 mmol/L (ref 22–32)
Calcium: 9.2 mg/dL (ref 8.9–10.3)
Chloride: 100 mmol/L (ref 98–111)
Creatinine, Ser: 9.91 mg/dL — ABNORMAL HIGH (ref 0.61–1.24)
GFR, Estimated: 6 mL/min — ABNORMAL LOW (ref >60)
Glucose, Bld: 125 mg/dL — ABNORMAL HIGH (ref 70–99)
Potassium: 5.7 mmol/L — ABNORMAL HIGH (ref 3.5–5.1)
Sodium: 137 mmol/L (ref 135–145)

## 2021-01-09 LAB — POTASSIUM
Potassium: 6.6 mmol/L (ref 3.5–5.1)
Potassium: 7.2 mmol/L (ref 3.5–5.1)
Potassium: 4.8 mmol/L (ref 3.5–5.1)

## 2021-01-09 MED ORDER — HEPARIN SODIUM (PORCINE) 1000 UNIT/ML IJ SOLN
INTRAMUSCULAR | Status: AC
  Filled 2021-01-09: qty 4

## 2021-01-09 MED ORDER — ALUM & MAG HYDROXIDE-SIMETH 200-200-20 MG/5ML PO SUSP
30.0000 mL | Freq: Once | ORAL | Status: DC
  Filled 2021-01-09: qty 30

## 2021-01-09 MED ORDER — SODIUM ZIRCONIUM CYCLOSILICATE 10 G PO PACK
10.0000 g | PACK | Freq: Once | ORAL | Status: AC
  Administered 2021-01-09: 10 g via ORAL
  Filled 2021-01-09: qty 1

## 2021-01-09 NOTE — Progress Notes (Signed)
**[name] [name]** [name] [name]  Subjective: pt had new TDC placed last night. K+ was 5.7 this am.   Vitals:   01/08/21 2006 01/08/21 2027 01/09/21 0336 01/09/21 0449  BP: (!) 179/86 (!) 150/79  (!) 162/77  Pulse: 68 69  65  Resp: '16 20  18  '$ Temp:  98.6 F (37 C)  98.4 F (36.9 C)  TempSrc:  Oral  Oral  SpO2: 97% 90%  99%  Weight:   86.7 kg   Height:        Exam: Gen alert, no distress No rash, cyanosis or gangrene Sclera anicteric, throat clear  No jvd or bruits Chest clear bilat to bases, no rales/ wheezing RRR no MRG Abd soft ntnd no mass or ascites +bs GU normal MS no joint effusions or deformity Ext 1+ bilat pretib edema, no wounds or ulcers Neuro is alert, Ox 3 , nf  RUA AVF , healing wound w/ sutures, + bruit  L IJ TDC, new       OP HD: [place] MWF  4h  450/800  86kg  2/2.25 bath  Hep none  RUA AVF  - hect 7 ug tiw  - mircera  200 q2 last 12/31/20    Assessment/ Plan: 1. Hyperkalemia - improved, but still up. Pt refusing HD today or tomorrow. Says he will avoid high K+ foods. Will give lokelma 10 gm now and please give him lokelma 10 gm bid for tomorrow 3/24 while at [name]. [name] [name] today.  2. ESRD - on HD TTS. Extra HD yest off schedule. Refusing further HD here. Won't go to HD until Friday , 1st shift at OP center. See plan as above.  3. Recent R AVF surgery w/ ulcer resection - functioning AVF but unable to use now d/t infiltration and difficulty sticking.  4. DM2 5. Anemia ckd- Hb 8.4, next esa due 3/28 6. MBD ckd - cont vdra, any binders 7. CAD hx PCI 8. PAD - hx R partial foot amp      [name] [name] 01/09/2021, 10:31 AM   Recent Labs  Lab 01/08/21 0839 01/08/21 1230 01/08/21 1622 01/08/21 2059 01/09/21 0137  K 6.4*   < > 2.6* 5.7* 5.7*  BUN 65*   < > 19  --  60*  CREATININE 9.81*   < > 3.40*  --  9.91*  CALCIUM 9.1  --   --   --  9.2  HGB 8.4*   < > 10.9*  --  7.5*   < > = values in this interval not displayed.    Inpatient medications: . amLODipine  10 mg Oral QHS  . aspirin EC  81 mg Oral Daily  . doxercalciferol  7 mcg Intravenous Q M,W,F-HD  . heparin  5,000 Units Subcutaneous Q8H  . hydrALAZINE  50 mg Oral TID  . isosorbide dinitrate  20 mg Oral TID  . propranolol  80 mg Oral BID  . sevelamer carbonate  1,600 mg Oral TID WC  . sodium chloride flush  3 mL Intravenous Q12H  . ticagrelor  90 mg Oral BID    acetaminophen-codeine, diphenhydrAMINE, hydrOXYzine, promethazine, traZODone

## 2021-01-09 NOTE — Progress Notes (Addendum)
  Progress Note    01/09/2021 7:47 AM 1 Day Post-Op  Subjective:  Sleeping-wakes easily.  Concerned about why potassium keeps rising.  Afebrile   Vitals:   01/08/21 2027 01/09/21 0449  BP: (!) 150/79 (!) 162/77  Pulse: 69 65  Resp: 20 18  Temp: 98.6 F (37 C) 98.4 F (36.9 C)  SpO2: 90% 99%    Physical Exam: General:  No distress Lungs:  Non labored Incisions:  Right groin is soft without hematoma; bandage is clean and dry.   CBC    Component Value Date/Time   WBC 5.8 01/09/2021 0137   RBC 2.71 (L) 01/09/2021 0137   HGB 7.5 (L) 01/09/2021 0137   HGB 8.6 (L) 06/28/2019 1042   HCT 24.8 (L) 01/09/2021 0137   HCT 27.8 (L) 06/28/2019 1042   PLT 214 01/09/2021 0137   PLT 308 06/28/2019 1042   MCV 91.5 01/09/2021 0137   MCV 85 06/28/2019 1042   MCH 27.7 01/09/2021 0137   MCHC 30.2 01/09/2021 0137   RDW 20.3 (H) 01/09/2021 0137   RDW 17.3 (H) 06/28/2019 1042   LYMPHSABS 0.8 09/24/2020 0431   MONOABS 0.6 09/24/2020 0431   EOSABS 0.2 09/24/2020 0431   BASOSABS 0.0 09/24/2020 0431    BMET    Component Value Date/Time   NA 137 01/09/2021 0137   NA 141 06/28/2019 1042   K 5.7 (H) 01/09/2021 0137   CL 100 01/09/2021 0137   CO2 24 01/09/2021 0137   GLUCOSE 125 (H) 01/09/2021 0137   BUN 60 (H) 01/09/2021 0137   BUN 28 (H) 06/28/2019 1042   CREATININE 9.91 (H) 01/09/2021 0137   CALCIUM 9.2 01/09/2021 0137   CALCIUM 7.9 (L) 08/31/2009 1524   GFRNONAA 6 (L) 01/09/2021 0137   GFRAA 6 (L) 08/05/2019 0537    INR    Component Value Date/Time   INR 1.14 06/18/2011 0500     Intake/Output Summary (Last 24 hours) at 01/09/2021 0747 Last data filed at 01/08/2021 2200 Gross per 24 hour  Intake 320 ml  Output 3010 ml  Net -2690 ml     Assessment:  55 y.o. male is s/p:  Ultrasound-guided placement of left IJ 28 cm tunneled dialysis catheter Removal of right temporary femoral catheter  1 Day Post-Op  Plan: -right groin where temp cath removed is soft without  hematoma.  Bandage is clean and dry. -pt has appt on 3/29 for follow up with [facility] for suture removal -hyperkalemic-dialysis per renal -will sign off-call if we can be of assistance.    Leontine Locket, PA-C [facility] [phone] 01/09/2021 7:47 AM  I have seen and evaluated the patient. I agree with the PA note as documented above.  55 year old male now postop day 1 status post placement of left IJ tunneled dialysis catheter.  Site looks clean and dry this morning.  His right femoral temporary catheter has been removed.  He has follow-up with Dr. Stanford Breed next week for suture removal of AVF revision.  Vascular will sign off.  Call with questions or concerns.  Marty Heck, MD [facility] [place] Office: [phone]

## 2021-01-09 NOTE — Discharge Summary (Signed)
Physician Discharge Summary  MICHAL CALLICOTT WJX:[id number] DOB: [birth year] DOA: 01/07/2021  PCP: [name], MD  Admit date: 01/07/2021 Discharge date:  Plan for discharge 01/10/21  Admitted From: Home  Disposition:  Home   Recommendations for Outpatient Follow-up and new medication changes:  1. Follow up with Dr. Jeanie Cooks in 7 days.  2. Next HD on 01/11/21.   Home Health: no   Equipment/Devices: no    Discharge Condition: stable  CODE STATUS: full  Diet recommendation: heart healthy and renal prudent.   Brief/Interim Summary: Mr. Porche was admitted to the hospital with the working diagnosis of hyperkalemia.  55 year old male past medical history for end-stage renal disease on hemodialysis, (MWF), coronary artery disease, peripheral artery disease, and type 2 diabetes mellitus. Patient admitted to the hospital for exchange of his malfunctioning right arm fistula.  Apparently unable to cannulate properly as an outpatient, and having short hemodialysis treatments. His potassium was found to be 7.8 and he was admitted for further management. On his initial physical examination his blood pressure 232/92, heart rate 90, oxygen saturation 100%, his lungs were clear to auscultation bilaterally, heart S1-S2, present rhythm, abdomen soft, no lower extremity edema.  Sodium 135, potassium 7.3, chloride 102, bicarb 25, glucose 194, BUN 82, creatinine 12, white count 5.8, hemoglobin 7.8, hematocrit 25.5, platelets 244.Marland Kitchen  Patient received calcium gluconate, dextrose and insulin for acute management of hyperkalemia. He had a temporary hemodialysis catheter placed 03/22 and underwent hemodialysis with low K bath. Underwent hemodialysis 3/21, with 3/22, and 3/23.  1.  End-stage renal disease on hemodialysis/hyperkalemia.  Patient was admitted to the medical ward, he was placed on a telemetry monitor.  Received hemodialysis for potassium correction, along with oral sodium zirconium.  After 2  consecutive sessions of hemodialysis he continued to be hyperkalemic, he is on undergoing his third treatment today, plan to recheck his potassium level in a.m.  2.  Severe peripheral vascular disease status post right forefoot amputation. Follow-up as an outpatient.  Continue pain control with Tylenol.  3.  Coronary artery disease.  Patient remained chest pain-free, continue aspirin, ticagrelor, beta-blockade and isosorbide.  4.  Type 2 diabetes mellitus.  Continue insulin sliding scale for glucose coverage monitoring.   Discharge Diagnoses:  Principal Problem:   Hyperkalemia Active Problems:   Essential hypertension   ESRD on dialysis [facility])   Type 2 diabetes mellitus ([place])   Dialysis AV fistula malfunction, initial encounter [facility])    Discharge Instructions   Allergies as of 01/09/2021      Reactions   Atorvastatin    Other reaction(s): Muscle Pain   Rosuvastatin    Other reaction(s): Muscle Pain   Amoxicillin Itching   Did it involve swelling of the face/tongue/throat, SOB, or low BP? Unknown Did it involve sudden or severe rash/hives, skin peeling, or any reaction on the inside of your mouth or nose? Unknown Did you need to seek medical attention at a hospital or doctor's office? Unknown When did it last happen?20 years ago If all above answers are "NO", may proceed with cephalosporin use.   Chlorhexidine Hives   Patient has blistering   Hydromorphone Itching   Patient states he may take with benadryl. Makes feet itch.   Percocet [oxycodone-acetaminophen] Itching, Other (See Comments)   Legs only. Itching and burning feeling.   Gabapentin Nausea And Vomiting   Stomach issues       Medication List    TAKE these medications   Accu-Chek Aviva Plus test strip Generic drug:  glucose blood Use as instructed   accu-chek softclix lancets Use as instructed   acetaminophen-codeine 300-30 MG tablet Commonly known as: TYLENOL #3 Take 1 tablet by mouth every 6  (six) hours as needed. What changed: reasons to take this   amLODipine 10 MG tablet Commonly known as: NORVASC Take 10 mg by mouth at bedtime.   aspirin EC 81 MG tablet Take 1 tablet (81 mg total) by mouth daily.   blood glucose meter kit and supplies Dispense based on patient and insurance preference. Use up to four times daily as directed. (FOR ICD-10 E10.9, E11.9).   blood glucose meter kit and supplies Dispense based on patient and insurance preference. Use up to four times daily as directed. (FOR ICD-9 250.00, 250.01).   Brilinta 90 MG Tabs tablet Generic drug: ticagrelor Take 90 mg by mouth 2 (two) times daily.   FreeStyle [employer] 14 Day Sensor Misc See admin instructions.   hydrALAZINE 50 MG tablet Commonly known as: APRESOLINE Take 50 mg by mouth 3 (three) times daily. What changed: Another medication with the same name was removed. Continue taking this medication, and follow the directions you see here.   isosorbide dinitrate 20 MG tablet Commonly known as: ISORDIL Take 20 mg by mouth 3 (three) times daily.   lidocaine-prilocaine cream Commonly known as: EMLA Apply 1 application topically as needed. What changed: reasons to take this   propranolol 80 MG tablet Commonly known as: INDERAL Take 1 tablet (80 mg total) by mouth 2 (two) times daily.   sevelamer carbonate 800 MG tablet Commonly known as: RENVELA Take 1,600 mg by mouth 3 (three) times daily with meals.       Allergies  Allergen Reactions  . Atorvastatin     Other reaction(s): Muscle Pain  . Rosuvastatin     Other reaction(s): Muscle Pain  . Amoxicillin Itching    Did it involve swelling of the face/tongue/throat, SOB, or low BP? Unknown Did it involve sudden or severe rash/hives, skin peeling, or any reaction on the inside of your mouth or nose? Unknown Did you need to seek medical attention at a hospital or doctor's office? Unknown When did it last happen?20 years ago If all above  answers are "NO", may proceed with cephalosporin use.   . Chlorhexidine Hives    Patient has blistering  . Hydromorphone Itching    Patient states he may take with benadryl. Makes feet itch.  Marland Kitchen Percocet [Oxycodone-Acetaminophen] Itching and Other (See Comments)    Legs only. Itching and burning feeling.  . Gabapentin Nausea And Vomiting    Stomach issues     Consultations:  Nephrology   Vascular surgery    Procedures/Studies: DG Abd 1 View  Result Date: 01/07/2021 CLINICAL DATA:  Femoral central line placement EXAM: ABDOMEN - 1 VIEW COMPARISON:  Radiograph 06/08/2011 FINDINGS: A right femoral line courses towards midline prior redirecting inferiorly towards the pelvis, possibly directed into the internal iliac. Recommend repositioning prior to use. Moderate colonic stool burden. No high-grade obstructive bowel gas pattern. No suspicious abdominal calcifications. Question tubing versus a fecal management system in the deep pelvis, projecting over the level of the rectal vault. Telemetry leads overlie the chest. No acute or worrisome osseous abnormalities. IMPRESSION: 1. Right femoral line courses towards midline prior redirecting inferiorly towards the pelvis, possibly directed into the internal iliac. Recommend repositioning prior to use. Electronically Signed   By: Lovena Le M.D.   On: 01/07/2021 23:36   DG Chest [facility] [address]  Result Date: 01/08/2021 CLINICAL DATA:  End-stage renal disease EXAM: PORTABLE CHEST 1 VIEW COMPARISON:  September 21, 2020 FINDINGS: The heart size and mediastinal contours are mildly enlarged. A left-sided central venous catheter seen with the tip at the superior cavoatrial junction. There is prominence of the central pulmonary vasculature. No pleural effusion. No acute osseous abnormality. Vascular stent seen within the right axilla. IMPRESSION: Mild cardiomegaly and pulmonary vascular congestion. Electronically Signed   By: Prudencio Pair M.D.   On: 01/08/2021  20:12   DG Fluoro Guide CV Line-No Report  Result Date: 01/08/2021 Fluoroscopy was utilized by the requesting physician.  No radiographic interpretation.      Procedures: right tunled IJ HD catheter placement   Subjective: Patient is on HD at the time of my examination, no dyspnea or chest pain, no nausea or vomiting,   Discharge Exam: Vitals:   01/09/21 1500 01/09/21 1530  BP: (!) 164/89 (!) 165/87  Pulse:    Resp: 14 13  Temp:    SpO2:     Vitals:   01/09/21 1403 01/09/21 1430 01/09/21 1500 01/09/21 1530  BP: 138/77 (!) 150/82 (!) 164/89 (!) 165/87  Pulse:      Resp: _0 Temp:      TempSrc:      SpO2:      Weight:      Height:        General: Not in pain or dyspnea.  Neurology: Awake and alert, non focal  E ENT: no pallor, no icterus, oral mucosa moist Cardiovascular: No JVD. S1-S2 present, rhythmic, no gallops, rubs, or murmurs. No lower extremity edema. Pulmonary: positive breath sounds bilaterally, adequate air movement, no wheezing, rhonchi or rales. Gastrointestinal. Abdomen soft and non tender Skin. No rashes Musculoskeletal: no joint deformities   The results of significant diagnostics from this hospitalization (including imaging, microbiology, ancillary and laboratory) are listed below for reference.     Microbiology: Recent Results (from the past 240 hour(s))  SARS Coronavirus 2 by RT PCR (hospital order, performed in [facility] hospital lab) Nasopharyngeal Nasopharyngeal Swab     Status: None   Collection Time: 01/07/21  7:41 AM   Specimen: Nasopharyngeal Swab  Result Value Ref Range Status   SARS Coronavirus 2 NEGATIVE NEGATIVE Final    Comment: (NOTE) SARS-CoV-2 target nucleic acids are NOT DETECTED.  The SARS-CoV-2 RNA is generally detectable in upper and lower respiratory specimens during the acute phase of infection. The lowest concentration of SARS-CoV-2 viral copies this assay can detect is 250 copies / mL. A negative result  does not preclude SARS-CoV-2 infection and should not be used as the sole basis for treatment or other patient management decisions.  A negative result may occur with improper specimen collection / handling, submission of specimen other than nasopharyngeal swab, presence of viral mutation(s) within the areas targeted by this assay, and inadequate number of viral copies (<250 copies / mL). A negative result must be combined with clinical observations, patient history, and epidemiological information.  Fact Sheet for Patients:   StrictlyIdeas.no  Fact Sheet for Healthcare Providers: BankingDealers.co.za  This test is not yet approved or  cleared by the [place] FDA and has been authorized for detection and/or diagnosis of SARS-CoV-2 by FDA under an Emergency Use Authorization (EUA).  This EUA will remain in effect (meaning this test can be used) for the duration of the COVID-19 declaration under Section 564(b)(1) of the Act, 21 U.S.C. section 360bbb-3(b)(1), unless the authorization is  terminated or revoked sooner.  Performed at [facility], [place] [address] [place], [place] [address]   Surgical pcr screen     Status: None   Collection Time: 01/08/21  3:33 AM   Specimen: Nasal Mucosa; Nasal Swab  Result Value Ref Range Status   MRSA, PCR NEGATIVE NEGATIVE Final   Staphylococcus aureus NEGATIVE NEGATIVE Final    Comment: (NOTE) The Xpert SA Assay (FDA approved for NASAL specimens in patients 26 years of age and older), is one component of a comprehensive surveillance program. It is not intended to diagnose infection nor to guide or monitor treatment. Performed at [facility], [place] [address], [place],  [address]      Labs: BNP (last 3 results) No results for input(s): BNP in the last 8760 hours. Basic Metabolic Panel: Recent Labs  Lab 01/07/21 1017 01/07/21 1335 01/07/21 2316 01/08/21 0839  01/08/21 1230 01/08/21 1437 01/08/21 1610 01/08/21 1622 01/08/21 2059 01/09/21 0137 01/09/21 0950 01/09/21 1312  NA 136 138  --  136  --  136  --  138  --  137  --   --   K 7.2* 6.8*   < > 6.4*   < > 5.8*   < > 2.6* 5.7* 5.7* 6.6* 7.2*  CL 94* 97*  --  100  --  101  --  97*  --  100  --   --   CO2 25 24  --  21*  --   --   --   --   --  24  --   --   GLUCOSE 191* 94  --  189*  --  142*  --  108*  --  125*  --   --   BUN 79* 81*  --  65*  --  66*  --  19  --  60*  --   --   CREATININE 11.88* 11.79*  --  9.81*  --  10.30*  --  3.40*  --  9.91*  --   --   CALCIUM 9.6 9.7  --  9.1  --   --   --   --   --  9.2  --   --    < > = values in this interval not displayed.   Liver Function Tests: Recent Labs  Lab 01/08/21 0839  AST 24  ALT 20  ALKPHOS 167*  BILITOT 1.7*  PROT 6.8  ALBUMIN 3.3*   No results for input(s): LIPASE, AMYLASE in the last 168 hours. No results for input(s): AMMONIA in the last 168 hours. CBC: Recent Labs  Lab 01/07/21 2316 01/08/21 0839 01/08/21 1437 01/08/21 1622 01/09/21 0137  WBC 5.8 5.9  --   --  5.8  HGB 7.8* 8.4* 8.5* 10.9* 7.5*  HCT 25.5* 26.5* 25.0* 32.0* 24.8*  MCV 89.8 89.5  --   --  91.5  PLT 244 239  --   --  214   Cardiac Enzymes: No results for input(s): CKTOTAL, CKMB, CKMBINDEX, TROPONINI in the last 168 hours. BNP: Invalid input(s): POCBNP CBG: Recent Labs  Lab 01/07/21 1109 01/07/21 1239 01/07/21 1456 01/08/21 1132 01/08/21 1906  GLUCAP 176* 135* 100* 154* 112*   D-Dimer No results for input(s): DDIMER in the last 72 hours. Hgb A1c No results for input(s): HGBA1C in the last 72 hours. Lipid Profile No results for input(s): CHOL, HDL, LDLCALC, TRIG, CHOLHDL, LDLDIRECT in the last 72 hours. Thyroid function studies No results  for input(s): TSH, T4TOTAL, T3FREE, THYROIDAB in the last 72 hours.  Invalid input(s): FREET3 Anemia work up No results for input(s): VITAMINB12, FOLATE, FERRITIN, TIBC, IRON, RETICCTPCT in the  last 72 hours. Urinalysis    Component Value Date/Time   COLORURINE YELLOW 06/06/2011 2300   APPEARANCEUR CLOUDY (A) 06/06/2011 2300   LABSPEC 1.021 06/06/2011 2300   PHURINE 5.0 06/06/2011 2300   GLUCOSEU NEGATIVE 06/06/2011 2300   GLUCOSEU > or = 1000 mg/dL (AA) 05/16/2008 1215   HGBUR SMALL (A) 06/06/2011 2300   HGBUR moderate 11/01/2007 0931   BILIRUBINUR NEGATIVE 06/06/2011 2300   KETONESUR NEGATIVE 06/06/2011 2300   PROTEINUR >300 (A) 06/06/2011 2300   UROBILINOGEN 0.2 06/06/2011 2300   NITRITE NEGATIVE 06/06/2011 2300   LEUKOCYTESUR SMALL (A) 06/06/2011 2300   Sepsis Labs Invalid input(s): PROCALCITONIN,  WBC,  LACTICIDVEN Microbiology Recent Results (from the past 240 hour(s))  SARS Coronavirus 2 by RT PCR (hospital order, performed in [place] hospital lab) Nasopharyngeal Nasopharyngeal Swab     Status: None   Collection Time: 01/07/21  7:41 AM   Specimen: Nasopharyngeal Swab  Result Value Ref Range Status   SARS Coronavirus 2 NEGATIVE NEGATIVE Final    Comment: (NOTE) SARS-CoV-2 target nucleic acids are NOT DETECTED.  The SARS-CoV-2 RNA is generally detectable in upper and lower respiratory specimens during the acute phase of infection. The lowest concentration of SARS-CoV-2 viral copies this assay can detect is 250 copies / mL. A negative result does not preclude SARS-CoV-2 infection and should not be used as the sole basis for treatment or other patient management decisions.  A negative result may occur with improper specimen collection / handling, submission of specimen other than nasopharyngeal swab, presence of viral mutation(s) within the areas targeted by this assay, and inadequate number of viral copies (<250 copies / mL). A negative result must be combined with clinical observations, patient history, and epidemiological information.  Fact Sheet for Patients:   StrictlyIdeas.no  Fact Sheet for Healthcare  Providers: BankingDealers.co.za  This test is not yet approved or  cleared by the [place] FDA and has been authorized for detection and/or diagnosis of SARS-CoV-2 by FDA under an Emergency Use Authorization (EUA).  This EUA will remain in effect (meaning this test can be used) for the duration of the COVID-19 declaration under Section 564(b)(1) of the Act, 21 U.S.C. section 360bbb-3(b)(1), unless the authorization is terminated or revoked sooner.  Performed at [facility], [place] [address], [place], [place] [address]   Surgical pcr screen     Status: None   Collection Time: 01/08/21  3:33 AM   Specimen: Nasal Mucosa; Nasal Swab  Result Value Ref Range Status   MRSA, PCR NEGATIVE NEGATIVE Final   Staphylococcus aureus NEGATIVE NEGATIVE Final    Comment: (NOTE) The Xpert SA Assay (FDA approved for NASAL specimens in patients 32 years of age and older), is one component of a comprehensive surveillance program. It is not intended to diagnose infection nor to guide or monitor treatment. Performed at [facility], [place] [address], [place], [place] [address]      Time coordinating discharge: 45 minutes  SIGNED:   Tawni Millers, MD  [facility] 01/09/2021, 4:02 PM

## 2021-01-09 NOTE — Progress Notes (Signed)
Patient complains of chest pressure, EKG done. Patient says he thinks it's gas, so standing order for Maalox ordered as well. MD notified.

## 2021-01-10 LAB — POTASSIUM
Potassium: 4.9 mmol/L (ref 3.5–5.1)
Potassium: 4.9 mmol/L (ref 3.5–5.1)

## 2021-01-10 NOTE — Plan of Care (Signed)

## 2021-01-10 NOTE — Progress Notes (Signed)
D/C instructions given and reviewed. Tele and IV removed, tolerated well. Wife will arrive around noon.

## 2021-01-10 NOTE — TOC Initial Note (Signed)
Transition of Care [facility]) - Initial/Assessment Note    Patient Details  Name: [name] MRN: [id number] Date of Birth: September 09, 1966  Transition of Care [facility]) CM/SW Contact:    Zenon Mayo, RN Phone Number: 01/10/2021, 9:24 AM  Clinical Narrative:                 Patient is for dc today, he he states he thinks he will have transport today, if not he will let this NCM know. He has no issues with medications.    Expected Discharge Plan: Home/Self Care Barriers to Discharge: No Barriers Identified   Patient Goals and CMS Choice Patient states their goals for this hospitalization and ongoing recovery are:: go home   Choice offered to / list presented to : NA  Expected Discharge Plan and Services Expected Discharge Plan: Home/Self Care   Discharge Planning Services: CM Consult Post Acute Care Choice: NA Living arrangements for the past 2 months: Single Family Home Expected Discharge Date: 01/10/21                         [facility] Arranged: NA          Prior Living Arrangements/Services Living arrangements for the past 2 months: Single Family Home Lives with:: Spouse Patient language and need for interpreter reviewed:: Yes          Care giver support system in place?: Yes (comment)      Activities of Daily Living Home Assistive Devices/Equipment: Eyeglasses,Shower chair without back ADL Screening (condition at time of admission) Patient's cognitive ability adequate to safely complete daily activities?: Yes Is the patient deaf or have difficulty hearing?: No Does the patient have difficulty seeing, even when wearing glasses/contacts?: No Does the patient have difficulty concentrating, remembering, or making decisions?: No Patient able to express need for assistance with ADLs?: Yes Does the patient have difficulty dressing or bathing?: Yes Independently performs ADLs?: No Communication: Independent Dressing (OT): Needs assistance Is this a change from  baseline?: Pre-admission baseline Grooming: Independent Feeding: Independent Bathing: Needs assistance Is this a change from baseline?: Pre-admission baseline Toileting: Independent In/Out Bed: Independent Walks in Home: Independent Does the patient have difficulty walking or climbing stairs?: No Weakness of Legs: None Weakness of Arms/Hands: None  Permission Sought/Granted                  Emotional Assessment   Attitude/Demeanor/Rapport: Engaged Affect (typically observed): Appropriate Orientation: : Oriented to Self,Oriented to Place,Oriented to  Time,Oriented to Situation Alcohol / Substance Use: Not Applicable Psych Involvement: No (comment)  Admission diagnosis:  Dialysis AV fistula malfunction, initial encounter [facility]) [T82.590A] Patient Active Problem List   Diagnosis Date Noted  . Hyperkalemia 01/09/2021  . ESRD (end stage renal disease) ([place])   . Dialysis AV fistula malfunction, initial encounter ([place]) 01/07/2021  . NSTEMI (non-ST elevated myocardial infarction) ([place]) 09/21/2020  . Pancytopenia ([place]) 08/06/2020  . Anemia 06/23/2019  . Diabetic retinopathy associated with type 2 diabetes mellitus ([place]) 06/23/2019  . Other forms of retinal detachment(361.89) 06/23/2019  . Secondary hyperparathyroidism ([place]) 06/23/2019  . Gangrene of toe of right foot ([place])   . Ulcer of right foot, with necrosis of bone ([place])   . Abscess of right foot   . Diabetic infection of right foot ([place])   . Ulcer of right second toe with necrosis of bone ([place])   . Cellulitis of right foot 05/22/2019  . Acute osteomyelitis of right foot ([place]) 05/15/2019  .  Osteomyelitis of great toe of right foot ([place]) 05/14/2019  . Diabetic foot ulcer- right second toe 05/14/2019  . Sepsis ([place]) 05/14/2019  . Hyperlipidemia 05/11/2015  . Hyperglycemia 05/05/2015  . Chest pain 08/23/2013  . Type 2 diabetes mellitus ([place]) 08/23/2013  . Hemodialysis status ([place]) 08/23/2013  . Chronic kidney disease,  stage V ([place]) 08/23/2013  . Obesity, unspecified 08/23/2013  . ESRD on dialysis ([place]) 07/18/2011  . HYPOGLYCEMIA, UNSPECIFIED 11/13/2008  . RENAL INSUFFICIENCY 07/31/2008  . FOOT ULCER, LEFT 06/29/2008  . HYPERLIPIDEMIA 05/16/2008  . MRSA INFECTION 10/18/2007  . Essential hypertension 10/18/2007   PCP:  [name], MD Pharmacy:   [place], [place] - [place] [address] Phone: [phone] Fax: [phone]  [name] [place], [place] - [address] [place] [address] Phone: [phone] Fax: [place] [address], [place] [address] [place] [address] Phone: [phone] Fax: [phone]     Social Determinants of Health (SDOH) Interventions    Readmission Risk Interventions Readmission Risk Prevention Plan 01/10/2021  Transportation Screening Complete  PCP or Specialist Appt within 3-5 Days Complete  HRI or [place] Complete  Social Work Consult for [place] Planning/Counseling Complete  Palliative Care Screening Not Applicable  Medication Review [employer]) Complete  Some recent data might be hidden

## 2021-01-10 NOTE — Progress Notes (Signed)
[name] went through hemodialysis yesterday evening with no major complications.  His vital signs are stable, blood pressure 174/82, heart rate 68, respiratory rate 18, oxygen saturation 100% on room air.  His physical examination has not changed. Follow-up serum potassium is 4.9.  Plan for discharge home today, follow-up with hemodialysis on 3/25.

## 2021-01-10 NOTE — TOC Transition Note (Signed)
Transition of Care [facility]) - CM/SW Discharge Note   Patient Details  Name: [name] MRN: [id number] Date of Birth: 1966-02-02  Transition of [facility]) CM/SW Contact:  Zenon Mayo, RN Phone Number: 01/10/2021, 9:26 AM   Clinical Narrative:    Patient is for dc today, he he states he thinks he will have transport today, if not he will let this NCM know. He has no issues with medications.   Final next level of care: Home/Self Care Barriers to Discharge: No Barriers Identified   Patient Goals and CMS Choice Patient states their goals for this hospitalization and ongoing recovery are:: go home   Choice offered to / list presented to : NA  Discharge Placement                       Discharge Plan and Services   Discharge Planning Services: CM Consult Post Acute Care Choice: NA                    HH Arranged: NA          Social Determinants of Health (SDOH) Interventions     Readmission Risk Interventions Readmission Risk Prevention Plan 01/10/2021  Transportation Screening Complete  PCP or Specialist Appt within 3-5 Days Complete  HRI or [place] Complete  Social Work Consult for [place] Planning/Counseling Complete  Palliative Care Screening Not Applicable  Medication Review [employer]) Complete  Some recent data might be hidden

## 2021-01-10 NOTE — Plan of Care (Signed)
  Problem: Clinical Measurements: Goal: Cardiovascular complication will be avoided Outcome: Progressing   Problem: Activity: Goal: Risk for activity intolerance will decrease Outcome: Progressing   Problem: Clinical Measurements: Goal: Ability to maintain clinical measurements within normal limits will improve Outcome: Progressing

## 2021-01-11 ENCOUNTER — Telehealth: Payer: Self-pay | Admitting: Nephrology

## 2021-01-11 NOTE — Telephone Encounter (Signed)
Transition of care contact from inpatient facility  Date of discharge: 01/10/21 Date of contact: 01/11/21 Method: Phone Spoke to: Patient  Patient contacted to discuss transition of care from recent inpatient hospitalization. Patient was admitted to [facility] from 3/21-3/24/22 with discharge diagnosis of hyperkalemia/malfuntion of AVF s/p placement of [facility]    Medication changes were reviewed.  Patient will follow up with his/her outpatient HD unit on: He reports he went to dialysis on schedule today 01/11/21.

## 2021-01-14 DIAGNOSIS — Z992 Dependence on renal dialysis: Secondary | ICD-10-CM | POA: Diagnosis not present

## 2021-01-14 DIAGNOSIS — E875 Hyperkalemia: Secondary | ICD-10-CM | POA: Diagnosis not present

## 2021-01-14 DIAGNOSIS — N186 End stage renal disease: Secondary | ICD-10-CM | POA: Diagnosis not present

## 2021-01-15 ENCOUNTER — Ambulatory Visit (INDEPENDENT_AMBULATORY_CARE_PROVIDER_SITE_OTHER): Payer: Medicare Other | Admitting: Physician Assistant

## 2021-01-15 ENCOUNTER — Other Ambulatory Visit: Payer: Self-pay

## 2021-01-15 VITALS — BP 153/81 | HR 62 | Temp 98.6°F | Resp 20 | Ht 70.0 in | Wt 193.2 lb

## 2021-01-15 DIAGNOSIS — N186 End stage renal disease: Secondary | ICD-10-CM

## 2021-01-15 NOTE — Progress Notes (Signed)
    Postoperative Access Visit   History of Present Illness   [name] is a 55 y.o. year old male who presents for postoperative follow-up for revision of right upper extremity AV fistula by Dr. Stanford Breed on 12/27/20. 01/07/21 Placement of right temporary femoral catheter by Dr. Oneida Alar on 01/07/21 and subsequent placement of left IJ TDC and removal of right femoral temporary on 01/08/21 by Dr. Scot Dock. The patient's wounds are healing well.  The patient notes no steal symptoms.  The patient is able to complete their activities of daily living. He does have some soreness along the fistula from prior to surgery where it was infiltrated but this is slowly improving  He currently dialyzing via left IJ TDC on MWF at the [facility]. Dr. Hollie Salk is his Nephrologist   Physical Examination   Vitals:   01/15/21 0817  BP: (!) 153/81  Pulse: 62  Resp: 20  Temp: 98.6 F (37 C)  TempSrc: Temporal  SpO2: 100%  Weight: 193 lb 3.2 oz (87.6 kg)  Height: '5\' 10"'$  (1.778 m)   Body mass index is 27.72 kg/m.  right arm Incision is healing well, sutures removed, 2+ radial pulse, hand grip is /5, sensation in digits is intact, palpable thrill, bruit can be auscultated     Medical Decision Making   [name] is a 55 y.o. year old male who presents s/p revision of right upper extremity AV fistula by Dr. Stanford Breed on 12/27/20. 01/07/21 placement of right temporary femoral catheter by Dr. Oneida Alar on 01/07/21 and subsequent placement of left IJ TDC and removal of right femoral temporary on 01/08/21 by Dr. Scot Dock.   Patent is without signs or symptoms of steal syndrome  The patient's fistula can be stuck in the revised area after 02/07/21  The patient's tunneled dialysis catheter can be removed when Nephrology is comfortable with the performance of the right AV vistula  The patient may follow up on a prn basis   Karoline Caldwell, PA-C [facility]:  [facility] MD: Roxanne Mins

## 2021-01-16 DIAGNOSIS — M25512 Pain in left shoulder: Secondary | ICD-10-CM | POA: Diagnosis not present

## 2021-01-16 DIAGNOSIS — M25511 Pain in right shoulder: Secondary | ICD-10-CM | POA: Diagnosis not present

## 2021-01-18 DIAGNOSIS — N186 End stage renal disease: Secondary | ICD-10-CM | POA: Diagnosis not present

## 2021-01-18 DIAGNOSIS — D509 Iron deficiency anemia, unspecified: Secondary | ICD-10-CM | POA: Diagnosis not present

## 2021-01-18 DIAGNOSIS — N2581 Secondary hyperparathyroidism of renal origin: Secondary | ICD-10-CM | POA: Diagnosis not present

## 2021-01-18 DIAGNOSIS — E1129 Type 2 diabetes mellitus with other diabetic kidney complication: Secondary | ICD-10-CM | POA: Diagnosis not present

## 2021-01-18 DIAGNOSIS — Z992 Dependence on renal dialysis: Secondary | ICD-10-CM | POA: Diagnosis not present

## 2021-01-18 DIAGNOSIS — D631 Anemia in chronic kidney disease: Secondary | ICD-10-CM | POA: Diagnosis not present

## 2021-01-23 DIAGNOSIS — E1129 Type 2 diabetes mellitus with other diabetic kidney complication: Secondary | ICD-10-CM | POA: Diagnosis not present

## 2021-01-24 DIAGNOSIS — L97512 Non-pressure chronic ulcer of other part of right foot with fat layer exposed: Secondary | ICD-10-CM | POA: Diagnosis not present

## 2021-01-31 DIAGNOSIS — N186 End stage renal disease: Secondary | ICD-10-CM | POA: Diagnosis not present

## 2021-01-31 DIAGNOSIS — Z992 Dependence on renal dialysis: Secondary | ICD-10-CM | POA: Diagnosis not present

## 2021-02-10 ENCOUNTER — Other Ambulatory Visit: Payer: Self-pay

## 2021-02-10 ENCOUNTER — Emergency Department (HOSPITAL_COMMUNITY)
Admission: EM | Admit: 2021-02-10 | Discharge: 2021-02-11 | Disposition: A | Discharge status: Home / Self Care | Payer: Medicare Other | Attending: Emergency Medicine | Admitting: Emergency Medicine

## 2021-02-10 ENCOUNTER — Emergency Department (HOSPITAL_COMMUNITY): Payer: Medicare Other

## 2021-02-10 DIAGNOSIS — Z992 Dependence on renal dialysis: Secondary | ICD-10-CM | POA: Diagnosis not present

## 2021-02-10 DIAGNOSIS — I10 Essential (primary) hypertension: Secondary | ICD-10-CM | POA: Diagnosis not present

## 2021-02-10 DIAGNOSIS — D649 Anemia, unspecified: Secondary | ICD-10-CM | POA: Diagnosis not present

## 2021-02-10 DIAGNOSIS — T8249XA Other complication of vascular dialysis catheter, initial encounter: Secondary | ICD-10-CM | POA: Diagnosis not present

## 2021-02-10 DIAGNOSIS — R002 Palpitations: Secondary | ICD-10-CM | POA: Diagnosis not present

## 2021-02-10 DIAGNOSIS — Z79899 Other long term (current) drug therapy: Secondary | ICD-10-CM | POA: Diagnosis not present

## 2021-02-10 DIAGNOSIS — I2510 Atherosclerotic heart disease of native coronary artery without angina pectoris: Secondary | ICD-10-CM | POA: Diagnosis not present

## 2021-02-10 DIAGNOSIS — E11319 Type 2 diabetes mellitus with unspecified diabetic retinopathy without macular edema: Secondary | ICD-10-CM | POA: Diagnosis not present

## 2021-02-10 DIAGNOSIS — Z7901 Long term (current) use of anticoagulants: Secondary | ICD-10-CM | POA: Insufficient documentation

## 2021-02-10 DIAGNOSIS — I120 Hypertensive chronic kidney disease with stage 5 chronic kidney disease or end stage renal disease: Secondary | ICD-10-CM | POA: Insufficient documentation

## 2021-02-10 DIAGNOSIS — E876 Hypokalemia: Secondary | ICD-10-CM | POA: Diagnosis not present

## 2021-02-10 DIAGNOSIS — R079 Chest pain, unspecified: Secondary | ICD-10-CM | POA: Diagnosis not present

## 2021-02-10 DIAGNOSIS — N186 End stage renal disease: Secondary | ICD-10-CM | POA: Diagnosis not present

## 2021-02-10 DIAGNOSIS — Z7982 Long term (current) use of aspirin: Secondary | ICD-10-CM | POA: Diagnosis not present

## 2021-02-10 DIAGNOSIS — E875 Hyperkalemia: Secondary | ICD-10-CM

## 2021-02-10 DIAGNOSIS — Z452 Encounter for adjustment and management of vascular access device: Secondary | ICD-10-CM | POA: Diagnosis not present

## 2021-02-10 DIAGNOSIS — E1122 Type 2 diabetes mellitus with diabetic chronic kidney disease: Secondary | ICD-10-CM | POA: Diagnosis not present

## 2021-02-10 DIAGNOSIS — R0789 Other chest pain: Secondary | ICD-10-CM | POA: Diagnosis not present

## 2021-02-10 DIAGNOSIS — E039 Hypothyroidism, unspecified: Secondary | ICD-10-CM | POA: Diagnosis not present

## 2021-02-10 DIAGNOSIS — I517 Cardiomegaly: Secondary | ICD-10-CM | POA: Diagnosis not present

## 2021-02-10 DIAGNOSIS — R42 Dizziness and giddiness: Secondary | ICD-10-CM | POA: Diagnosis not present

## 2021-02-10 DIAGNOSIS — Z20822 Contact with and (suspected) exposure to covid-19: Secondary | ICD-10-CM | POA: Insufficient documentation

## 2021-02-10 DIAGNOSIS — T82838A Hemorrhage of vascular prosthetic devices, implants and grafts, initial encounter: Secondary | ICD-10-CM | POA: Insufficient documentation

## 2021-02-10 DIAGNOSIS — R0602 Shortness of breath: Secondary | ICD-10-CM | POA: Diagnosis not present

## 2021-02-10 NOTE — ED Triage Notes (Addendum)
picc line got dislodged while taking a shower. Bleeding from site.   Dialysis MWF. Pt takes Brilinta.

## 2021-02-10 NOTE — ED Provider Notes (Addendum)
[facility] EMERGENCY DEPARTMENT Provider Note   CSN: [id number] Arrival date & time: 02/10/21  2157     History No chief complaint on file.   [name] is a 55 y.o. male.  Patient is a 55 year old male with extensive past medical history including coronary artery disease, diabetes, osteomyelitis of the foot, and end-stage renal disease for which she is on hemodialysis on Mondays, Wednesdays, and Fridays.  Patient presents here with bleeding from his dialysis catheter.  This evening he was getting out of the shower and attempted to remove the Tegaderm over the catheter site when he pulled the catheter and it began bleeding.  Bleeding seems to have improved with direct pressure with a towel at home.  Patient last dialyzed Friday (2 days ago) and is due for dialysis tomorrow.  The catheter was placed in March by Dr. Scot Dock.  The history is provided by the patient.       Past Medical History:  Diagnosis Date  . Abscess of right foot   . Acute osteomyelitis of right foot ([place]) 05/15/2019  . Anemia 06/23/2019  . Cellulitis of right foot 05/22/2019  . Chest pain 08/23/2013  . Chills    at night - sometimes  . Complication of anesthesia   . Coronary artery disease   . Diabetes mellitus    controlled by diet  . 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 Diabetic foot ulcer- right second toe 05/14/2019  . Diabetic infection of right foot ([place])   . Diabetic retinopathy associated with type 2 diabetes mellitus ([place]) 06/23/2019  . Eczema   . ESRD on hemodialysis ([place]) 07/18/2011  . Essential hypertension 10/18/2007   Qualifier: Diagnosis of  By: Loanne Drilling MD, Jacelyn Pi   . Fatigue    loss of fatigue  . FOOT ULCER, LEFT 06/29/2008   Qualifier: Diagnosis of  By: Loanne Drilling MD, Sean A   . Gangrene of toe of right foot ([place])   . Headache(784.0)    Years ago  . Heart murmur    years ago  . History of blood transfusion   . Hyperglycemia 05/05/2015  . HYPERLIPIDEMIA  05/16/2008   Qualifier: Diagnosis of  By: Jenny Reichmann MD, Hunt Oris   . Hypoglycemia, unspecified 11/13/2008   Qualifier: Diagnosis of  By: Jenny Reichmann MD, Hunt Oris   . MRSA (methicillin resistant staph aureus) culture positive   . MRSA INFECTION 10/18/2007   Qualifier: Diagnosis of  By: Marca Ancona RMA, Lucy    . Myocardial infarction ([place])   . Obesity, unspecified 08/23/2013  . Osteomyelitis of great toe of right foot ([place]) 05/14/2019  . Other forms of retinal detachment(361.89) 06/23/2019  . PONV (postoperative nausea and vomiting)   . Poor circulation   . Secondary hyperparathyroidism ([place]) 06/23/2019  . Sepsis ([place]) 05/14/2019  . Type 2 diabetes mellitus ([place]) 08/23/2013  . Ulcer of right foot, with necrosis of bone ([place])   . Ulcer of right second toe with necrosis of bone [facility])     Patient Active Problem List   Diagnosis Date Noted  . Hyperkalemia 01/09/2021  . ESRD (end stage renal disease) ([place])   . Dialysis AV fistula malfunction, initial encounter ([place]) 01/07/2021  . NSTEMI (non-ST elevated myocardial infarction) ([place]) 09/21/2020  . 3-vessel coronary artery disease 08/09/2020  . Pulmonary hypertension, unspecified ([place]) 08/09/2020  . Pancytopenia ([place]) 08/06/2020  . Allergy, unspecified, subsequent encounter 06/28/2020  . Anaphylactic shock, unspecified, subsequent encounter 06/28/2020  . Lymphedema of both lower extremities 12/29/2019  .  Hyperphosphatemia 12/11/2019  . Encounter for post surgical wound check 08/16/2019  . Post-op pain 08/16/2019  . S/P transmetatarsal amputation of foot, right ([place]) 08/16/2019  . Unspecified protein-calorie malnutrition ([place]) 07/12/2019  . PAD (peripheral artery disease) ([place]) 06/30/2019  . Anemia 06/23/2019  . Diabetic retinopathy associated with type 2 diabetes mellitus ([place]) 06/23/2019  . Other forms of retinal detachment(361.89) 06/23/2019  . Secondary hyperparathyroidism ([place]) 06/23/2019  . Osteomyelitis, unspecified ([place]) 05/31/2019  . Gangrene of toe of  right foot ([place])   . Ulcer of right foot, with necrosis of bone ([place])   . Abscess of right foot   . Diabetic infection of right foot ([place])   . Ulcer of right second toe with necrosis of bone ([place])   . Cellulitis of right foot 05/22/2019  . Acute osteomyelitis of right foot ([place]) 05/15/2019  . Osteomyelitis of great toe of right foot ([place]) 05/14/2019  . Diabetic foot ulcer- right second toe 05/14/2019  . Sepsis ([place]) 05/14/2019  . Hypothyroidism, unspecified 01/11/2018  . Hypercalcemia 06/14/2016  . Hyperlipidemia 05/11/2015  . Hyperglycemia 05/05/2015  . Diarrhea, unspecified 02/01/2015  . Fever, unspecified 02/01/2015  . Pain, unspecified 02/01/2015  . Pruritus, unspecified 02/01/2015  . Coagulation defect, unspecified ([place]) 07/07/2014  . Encounter for removal of sutures 05/30/2014  . Encounter for immunization 04/19/2014  . Chest pain 08/23/2013  . Type 2 diabetes mellitus ([place]) 08/23/2013  . Hemodialysis status ([place]) 08/23/2013  . Chronic kidney disease, stage V ([place]) 08/23/2013  . Obesity, unspecified 08/23/2013  . Complication of vascular dialysis catheter 12/15/2012  . Other fluid overload 12/07/2012  . Other specified complication of vascular prosthetic devices, implants and grafts, subsequent encounter 08/06/2012  . ESRD on dialysis ([place]) 07/18/2011  . Pure hypercholesterolemia 06/23/2011  . Iron deficiency anemia, unspecified 06/21/2011  . HYPOGLYCEMIA, UNSPECIFIED 11/13/2008  . RENAL INSUFFICIENCY 07/31/2008  . FOOT ULCER, LEFT 06/29/2008  . HYPERLIPIDEMIA 05/16/2008  . MRSA INFECTION 10/18/2007  . Essential hypertension 10/18/2007    Past Surgical History:  Procedure Laterality Date  . ABDOMINAL AORTOGRAM N/A 05/17/2019   Procedure: ABDOMINAL AORTOGRAM;  Surgeon: Elam Dutch, MD;  Location: [place] CV LAB;  Service: Cardiovascular;  Laterality: N/A;  . AMPUTATION TOE Right 05/14/2019   Procedure: PARTIAL AMPUTATION SECOND TOE RIGHT FOOT;  Surgeon:  Evelina Bucy, DPM;  Location: [place];  Service: Podiatry;  Laterality: Right;  . AMPUTATION TOE Right 05/23/2019   Procedure: AMPUTATION OF SECOND TOE METATARSAL PHALANGEAL JOINT;  Surgeon: Evelina Bucy, DPM;  Location: [place];  Service: Podiatry;  Laterality: Right;  . AMPUTATION TOE Right 05/27/2019   Procedure: Amputation Toe;  Surgeon: Evelina Bucy, DPM;  Location: [place];  Service: Podiatry;  Laterality: Right;  right third toe  . APPLICATION OF WOUND VAC Right 05/27/2019   Procedure: Application Of Wound Vac;  Surgeon: Evelina Bucy, DPM;  Location: [place];  Service: Podiatry;  Laterality: Right;  . AV FISTULA PLACEMENT  06-18-11   Right brachiocephalic AVF  . BONE BIOPSY Right 05/14/2019   Procedure: OPEN SUPERFICIAL BONE BIOPSY GREAT TOE;  Surgeon: Evelina Bucy, DPM;  Location: [place];  Service: Podiatry;  Laterality: Right;  . CORONARY BALLOON ANGIOPLASTY N/A 09/24/2020   Procedure: CORONARY BALLOON ANGIOPLASTY;  Surgeon: Martinique, Peter M, MD;  Location: [place] CV LAB;  Service: Cardiovascular;  Laterality: N/A;  . CORONARY STENT INTERVENTION N/A 09/24/2020   Procedure: CORONARY STENT INTERVENTION;  Surgeon: Martinique, Peter M, MD;  Location: [facility]  INVASIVE CV LAB;  Service: Cardiovascular;  Laterality: N/A;  . EYE SURGERY     left eye for Laser, diabetic retinopathy  . FISTULOGRAM Right 11/06/2011   Procedure: FISTULOGRAM;  Surgeon: Conrad Putnam, MD;  Location: [facility] CATH LAB;  Service: Cardiovascular;  Laterality: Right;  . HEMATOMA EVACUATION Right Feb. 25, 2014  . HEMATOMA EVACUATION Right 12/14/2012   Procedure: EVACUATION HEMATOMA;  Surgeon: Conrad Belleair, MD;  Location: [place];  Service: Vascular;  Laterality: Right;  . INCISION AND DRAINAGE Right 05/23/2019   Procedure: INCISION AND DRAINAGE OF RIGHT FOOT DEEP SPACE ABSCESS;  Surgeon: Evelina Bucy, DPM;  Location: [place];  Service: Podiatry;  Laterality: Right;  . INSERTION OF DIALYSIS CATHETER  12/14/2012   Procedure: INSERTION  OF DIALYSIS CATHETER;  Surgeon: Conrad Plush, MD;  Location: [place];  Service: Vascular;;  . INSERTION OF DIALYSIS CATHETER Right 01/07/2021   Procedure: INSERTION OF DIALYSIS CATHETER;  Surgeon: Elam Dutch, MD;  Location: [place];  Service: Vascular;  Laterality: Right;  . INSERTION OF DIALYSIS CATHETER Left 01/08/2021   Procedure: INSERTION OF LEFT INTERNAL JUGULAR DIALYSIS CATHETER, tunneled;  Surgeon: Angelia Mould, MD;  Location: [place];  Service: Vascular;  Laterality: Left;  . INTRAVASCULAR PRESSURE WIRE/FFR STUDY N/A 09/24/2020   Procedure: INTRAVASCULAR PRESSURE WIRE/FFR STUDY;  Surgeon: Martinique, Peter M, MD;  Location: [place] CV LAB;  Service: Cardiovascular;  Laterality: N/A;  . IRRIGATION AND DEBRIDEMENT FOOT Right 05/25/2019   Procedure: INCISION AND DRAINAGE FOOT;  Surgeon: Evelina Bucy, DPM;  Location: [place];  Service: Podiatry;  Laterality: Right;  . IRRIGATION AND DEBRIDEMENT FOOT Right 05/27/2019   Procedure: IRRIGATION AND DEBRIDEMENT FOOT;  Surgeon: Evelina Bucy, DPM;  Location: [place];  Service: Podiatry;  Laterality: Right;  . LEFT HEART CATH AND CORONARY ANGIOGRAPHY N/A 09/24/2020   Procedure: LEFT HEART CATH AND CORONARY ANGIOGRAPHY;  Surgeon: Martinique, Peter M, MD;  Location: [place] CV LAB;  Service: Cardiovascular;  Laterality: N/A;  . LOWER EXTREMITY ANGIOGRAPHY Bilateral 05/17/2019   Procedure: LOWER EXTREMITY ANGIOGRAPHY;  Surgeon: Elam Dutch, MD;  Location: [place] CV LAB;  Service: Cardiovascular;  Laterality: Bilateral;  . REMOVAL OF A DIALYSIS CATHETER Right 01/08/2021   Procedure: REMOVAL OF TEMPORARY DIALYSIS CATHETER, LEFT FEMORAL VEIN;  Surgeon: Angelia Mould, MD;  Location: [place];  Service: Vascular;  Laterality: Right;  . REVISON OF ARTERIOVENOUS FISTULA Right 12/14/2012   Procedure: REVISON OF upper arm ARTERIOVENOUS FISTULA using 32mx10cm gortex graft;  Surgeon: BConrad [place] MD;  [place]  Service: Vascular;   Laterality: Right;  . REVISON OF ARTERIOVENOUS FISTULA Right 12/27/2020   Procedure: RIGHT UPPER EXTREMITY ARTERIOVENOUS FISTULA REVISiON;  Surgeon: HCherre Robins MD;  [place]  Service: Vascular;  Laterality: Right;  PERIPHERAL NERVE BLOCK  . RIGHT/LEFT HEART CATH AND CORONARY ANGIOGRAPHY N/A 08/07/2020   Procedure: RIGHT/LEFT HEART CATH AND CORONARY ANGIOGRAPHY;  Surgeon: JMartinique Peter M, MD;  [place];  Service: Cardiovascular;  Laterality: N/A;  . SOFT TISSUE MASS EXCISION     Right arm, Left leg  for MRSA infection       Family History  Problem Relation Age of Onset  . Diabetes Mother     Social History   Tobacco Use  . Smoking status: Never Smoker  . Smokeless tobacco: Never Used  Vaping Use  . Vaping Use: Never used  Substance Use Topics  . Alcohol use:  No  . Drug use: No    Home Medications Prior to Admission medications   Medication Sig Start Date End Date Taking? Authorizing Provider  acetaminophen-codeine (TYLENOL #3) 300-30 MG tablet Take 1 tablet by mouth every 6 (six) hours as needed. Patient taking differently: Take 1 tablet by mouth every 6 (six) hours as needed for moderate pain. 12/27/20   Dagoberto Ligas, PA-C  amLODipine (NORVASC) 10 MG tablet Take 10 mg by mouth at bedtime.  08/03/20   [provider]  aspirin EC 81 MG tablet Take 1 tablet (81 mg total) by mouth daily. 06/28/19   Wellington Hampshire, MD  blood glucose meter kit and supplies Dispense based on patient and insurance preference. Use up to four times daily as directed. (FOR ICD-10 E10.9, E11.9). 05/18/19   Mikhail, Velta Addison, DO  blood glucose meter kit and supplies Dispense based on patient and insurance preference. Use up to four times daily as directed. (FOR ICD-9 250.00, 250.01). 05/18/19   Cristal Ford, DO  BRILINTA 90 MG TABS tablet Take 90 mg by mouth 2 (two) times daily. 12/26/20   [provider]  Continuous Blood Gluc Sensor (FREESTYLE LIBRE 14  DAY SENSOR) MISC See admin instructions. 06/20/19   [provider]  glucose blood (ACCU-CHEK AVIVA PLUS) test strip Use as instructed 05/18/19   Cristal Ford, DO  hydrALAZINE (APRESOLINE) 50 MG tablet Take 50 mg by mouth 3 (three) times daily. 12/18/20   [provider]  isosorbide dinitrate (ISORDIL) 20 MG tablet Take 20 mg by mouth 3 (three) times daily. 08/24/20   [provider]  Lancet Devices [facility]) lancets Use as instructed 05/18/19   Cristal Ford, DO  lidocaine-prilocaine (EMLA) cream Apply 1 application topically as needed. Patient taking differently: Apply 1 application topically as needed (port access). 01/02/21   Cherre Robins, MD  LOKELMA 10 g PACK packet SMARTSIG:1 Packet(s) By Mouth 6 Times a Week 01/14/21   [provider]  propranolol (INDERAL) 80 MG tablet Take 1 tablet (80 mg total) by mouth 2 (two) times daily. 09/26/20   Hongalgi, Lenis Dickinson, MD  sevelamer carbonate (RENVELA) 800 MG tablet Take 1,600 mg by mouth 3 (three) times daily with meals. 07/25/20   [provider]  ticagrelor (BRILINTA) 90 MG TABS tablet TAKE 1 TABLET (90 MG TOTAL) BY MOUTH TWO TIMES DAILY. 09/26/20 09/26/21  Modena Jansky, MD  loratadine (CLARITIN) 10 MG tablet Take 10 mg by mouth daily as needed for allergies.  11/06/19  [provider]    Allergies    Atorvastatin, Rosuvastatin, Amoxicillin, Chlorhexidine, Hydromorphone, Percocet [oxycodone-acetaminophen], and Gabapentin  Review of Systems   Review of Systems  All other systems reviewed and are negative.   Physical Exam Updated Vital Signs BP (!) 181/79 (BP Location: Left Arm)   Pulse 70   Temp 99.1 F (37.3 C) (Oral)   Resp 17   Ht '5\' 10"'  (1.778 m)   Wt 87.6 kg   SpO2 100%   BMI 27.71 kg/m   Physical Exam Vitals and nursing note reviewed.  Constitutional:      General: He is not in acute distress.    Appearance: He is well-developed. He is not diaphoretic.   HENT:     Head: Normocephalic and atraumatic.  Cardiovascular:     Rate and Rhythm: Normal rate and regular rhythm.     Heart sounds: No murmur heard. No friction rub.  Pulmonary:     Effort: Pulmonary effort is normal.  No respiratory distress.     Breath sounds: Normal breath sounds. No wheezing or rales.     Comments: The catheter site to the left subclavian with bloody towel in place, but no active bleeding.  The catheter does appear to tunnel around the subclavian and appears in proper positioning. Abdominal:     General: Bowel sounds are normal. There is no distension.     Palpations: Abdomen is soft.     Tenderness: There is no abdominal tenderness.  Musculoskeletal:        General: Normal range of motion.     Cervical back: Normal range of motion and neck supple.  Skin:    General: Skin is warm and dry.  Neurological:     Mental Status: He is alert and oriented to person, place, and time.     Coordination: Coordination normal.     ED Results / Procedures / Treatments   Labs (all labs ordered are listed, but only abnormal results are displayed) Labs Reviewed - No data to display  EKG EKG Interpretation  Date/Time:  Sunday February 10 2021 22:50:41 EDT Ventricular Rate:  70 PR Interval:  164 QRS Duration: 95 QT Interval:  446 QTC Calculation: 482 R Axis:   31 Text Interpretation: Sinus rhythm Borderline prolonged QT interval No significant change since 01/09/2021 Confirmed by Veryl Speak [phone]) on 02/11/2021 3:19:51 AM   Radiology No results found.  Procedures Procedures   Medications Ordered in ED Medications - No data to display  ED Course  I have reviewed the triage vital signs and the nursing notes.  Pertinent labs & imaging results that were available during my care of the patient were reviewed by me and considered in my medical decision making (see chart for details).    MDM Rules/Calculators/A&P  Patient presenting here with complaints of  bleeding from his PICC line site.  He was attempting to take the Tegaderm off after he showered this evening and pulled the line causing it to bleed.  Bleeding has resolved prior to arrival and has not recurred during his ED stay.  An x-ray was obtained showing proper positioning.  This was discussed with Dr. Stanford Breed from vascular surgery.  He does not feel as though the integrity of the catheter has been compromised and that it is okay for use.  He is due for dialysis tomorrow.  After discussing the case with vascular surgery, I returned to the room where the patient informed me he was feeling somewhat strangely.  He felt anxious, but denied any chest pain or difficulty breathing.  I did obtain laboratory studies at that time to rule out hyperkalemia or other electrolyte abnormality as the problem.  He has a potassium of 5.3 with no EKG changes.  His troponin is 37 and I suspect related to his history of renal failure.  He is not complaining of any chest discomfort and the symptoms are different than what he experienced with prior cardiac issues.  Patient is anxious to return home so he can get some rest before dialysis in the morning.  He feels better after receiving Ativan and I feel as though discharge is appropriate.  Patient is to return as needed if his symptoms worsen or change.  Final Clinical Impression(s) / ED Diagnoses Final diagnoses:  None    Rx / DC Orders ED Discharge Orders    None       Veryl Speak, MD 02/11/21 [id number]    Veryl Speak, MD 02/11/21 0320

## 2021-02-11 ENCOUNTER — Emergency Department (HOSPITAL_COMMUNITY)
Admission: EM | Admit: 2021-02-11 | Discharge: 2021-02-11 | Disposition: A | Discharge status: Home / Self Care | Payer: Medicare Other | Source: Home / Self Care | Attending: Emergency Medicine | Admitting: Emergency Medicine

## 2021-02-11 DIAGNOSIS — T829XXA Unspecified complication of cardiac and vascular prosthetic device, implant and graft, initial encounter: Secondary | ICD-10-CM

## 2021-02-11 DIAGNOSIS — I2510 Atherosclerotic heart disease of native coronary artery without angina pectoris: Secondary | ICD-10-CM | POA: Insufficient documentation

## 2021-02-11 DIAGNOSIS — Z7901 Long term (current) use of anticoagulants: Secondary | ICD-10-CM | POA: Insufficient documentation

## 2021-02-11 DIAGNOSIS — E876 Hypokalemia: Secondary | ICD-10-CM | POA: Insufficient documentation

## 2021-02-11 DIAGNOSIS — E039 Hypothyroidism, unspecified: Secondary | ICD-10-CM | POA: Insufficient documentation

## 2021-02-11 DIAGNOSIS — N186 End stage renal disease: Secondary | ICD-10-CM | POA: Insufficient documentation

## 2021-02-11 DIAGNOSIS — E11319 Type 2 diabetes mellitus with unspecified diabetic retinopathy without macular edema: Secondary | ICD-10-CM | POA: Insufficient documentation

## 2021-02-11 DIAGNOSIS — I120 Hypertensive chronic kidney disease with stage 5 chronic kidney disease or end stage renal disease: Secondary | ICD-10-CM | POA: Insufficient documentation

## 2021-02-11 DIAGNOSIS — Z20822 Contact with and (suspected) exposure to covid-19: Secondary | ICD-10-CM | POA: Insufficient documentation

## 2021-02-11 DIAGNOSIS — Y846 Urinary catheterization as the cause of abnormal reaction of the patient, or of later complication, without mention of misadventure at the time of the procedure: Secondary | ICD-10-CM | POA: Insufficient documentation

## 2021-02-11 DIAGNOSIS — Z7982 Long term (current) use of aspirin: Secondary | ICD-10-CM | POA: Insufficient documentation

## 2021-02-11 DIAGNOSIS — E875 Hyperkalemia: Secondary | ICD-10-CM | POA: Diagnosis not present

## 2021-02-11 DIAGNOSIS — E1122 Type 2 diabetes mellitus with diabetic chronic kidney disease: Secondary | ICD-10-CM | POA: Insufficient documentation

## 2021-02-11 DIAGNOSIS — Z79899 Other long term (current) drug therapy: Secondary | ICD-10-CM | POA: Insufficient documentation

## 2021-02-11 DIAGNOSIS — Z992 Dependence on renal dialysis: Secondary | ICD-10-CM | POA: Insufficient documentation

## 2021-02-11 DIAGNOSIS — R58 Hemorrhage, not elsewhere classified: Secondary | ICD-10-CM | POA: Diagnosis not present

## 2021-02-11 DIAGNOSIS — T82838A Hemorrhage of vascular prosthetic devices, implants and grafts, initial encounter: Secondary | ICD-10-CM | POA: Diagnosis not present

## 2021-02-11 DIAGNOSIS — T8249XA Other complication of vascular dialysis catheter, initial encounter: Secondary | ICD-10-CM | POA: Diagnosis not present

## 2021-02-11 DIAGNOSIS — D649 Anemia, unspecified: Secondary | ICD-10-CM | POA: Insufficient documentation

## 2021-02-11 DIAGNOSIS — I10 Essential (primary) hypertension: Secondary | ICD-10-CM | POA: Diagnosis not present

## 2021-02-11 LAB — BASIC METABOLIC PANEL
Anion gap: 16 — ABNORMAL HIGH (ref 5–15)
BUN: 71 mg/dL — ABNORMAL HIGH (ref 6–20)
CO2: 23 mmol/L (ref 22–32)
Calcium: 9.5 mg/dL (ref 8.9–10.3)
Chloride: 99 mmol/L (ref 98–111)
Creatinine, Ser: 9.49 mg/dL — ABNORMAL HIGH (ref 0.61–1.24)
GFR, Estimated: 6 mL/min — ABNORMAL LOW (ref >60)
Glucose, Bld: 135 mg/dL — ABNORMAL HIGH (ref 70–99)
Potassium: 5.3 mmol/L — ABNORMAL HIGH (ref 3.5–5.1)
Sodium: 138 mmol/L (ref 135–145)

## 2021-02-11 LAB — CBC WITH DIFFERENTIAL/PLATELET
Abs Immature Granulocytes: 0.05 10*3/uL (ref 0.00–0.07)
Abs Immature Granulocytes: 0.04 10*3/uL (ref 0.00–0.07)
Basophils Absolute: 0 10*3/uL (ref 0.0–0.1)
Basophils Absolute: 0.1 10*3/uL (ref 0.0–0.1)
Basophils Relative: 1 %
Basophils Relative: 1 %
Eosinophils Absolute: 0.2 10*3/uL (ref 0.0–0.5)
Eosinophils Absolute: 0.2 10*3/uL (ref 0.0–0.5)
Eosinophils Relative: 3 %
Eosinophils Relative: 3 %
HCT: 30.2 % — ABNORMAL LOW (ref 39.0–52.0)
HCT: 32.5 % — ABNORMAL LOW (ref 39.0–52.0)
Hemoglobin: 9 g/dL — ABNORMAL LOW (ref 13.0–17.0)
Hemoglobin: 9.7 g/dL — ABNORMAL LOW (ref 13.0–17.0)
Immature Granulocytes: 1 %
Immature Granulocytes: 1 %
Lymphocytes Relative: 8 %
Lymphocytes Relative: 8 %
Lymphs Abs: 0.5 10*3/uL — ABNORMAL LOW (ref 0.7–4.0)
Lymphs Abs: 0.5 10*3/uL — ABNORMAL LOW (ref 0.7–4.0)
MCH: 26.9 pg (ref 26.0–34.0)
MCH: 26.9 pg (ref 26.0–34.0)
MCHC: 29.8 g/dL — ABNORMAL LOW (ref 30.0–36.0)
MCHC: 29.8 g/dL — ABNORMAL LOW (ref 30.0–36.0)
MCV: 90.1 fL (ref 80.0–100.0)
MCV: 90 fL (ref 80.0–100.0)
Monocytes Absolute: 0.6 10*3/uL (ref 0.1–1.0)
Monocytes Absolute: 0.5 10*3/uL (ref 0.1–1.0)
Monocytes Relative: 9 %
Monocytes Relative: 8 %
Neutro Abs: 4.9 10*3/uL (ref 1.7–7.7)
Neutro Abs: 4.8 10*3/uL (ref 1.7–7.7)
Neutrophils Relative %: 78 %
Neutrophils Relative %: 79 %
Platelets: 191 10*3/uL (ref 150–400)
Platelets: 195 10*3/uL (ref 150–400)
RBC: 3.35 MIL/uL — ABNORMAL LOW (ref 4.22–5.81)
RBC: 3.61 MIL/uL — ABNORMAL LOW (ref 4.22–5.81)
RDW: 19.7 % — ABNORMAL HIGH (ref 11.5–15.5)
RDW: 19.8 % — ABNORMAL HIGH (ref 11.5–15.5)
WBC: 6.2 10*3/uL (ref 4.0–10.5)
WBC: 6 10*3/uL (ref 4.0–10.5)
nRBC: 0 % (ref 0.0–0.2)
nRBC: 0 % (ref 0.0–0.2)

## 2021-02-11 LAB — COMPREHENSIVE METABOLIC PANEL
ALT: 22 U/L (ref 0–44)
AST: 21 U/L (ref 15–41)
Albumin: 3.6 g/dL (ref 3.5–5.0)
Alkaline Phosphatase: 126 U/L (ref 38–126)
Anion gap: 16 — ABNORMAL HIGH (ref 5–15)
BUN: 76 mg/dL — ABNORMAL HIGH (ref 6–20)
CO2: 21 mmol/L — ABNORMAL LOW (ref 22–32)
Calcium: 9.5 mg/dL (ref 8.9–10.3)
Chloride: 100 mmol/L (ref 98–111)
Creatinine, Ser: 10.2 mg/dL — ABNORMAL HIGH (ref 0.61–1.24)
GFR, Estimated: 5 mL/min — ABNORMAL LOW (ref >60)
Glucose, Bld: 150 mg/dL — ABNORMAL HIGH (ref 70–99)
Potassium: 6.3 mmol/L (ref 3.5–5.1)
Sodium: 137 mmol/L (ref 135–145)
Total Bilirubin: 1.9 mg/dL — ABNORMAL HIGH (ref 0.3–1.2)
Total Protein: 7 g/dL (ref 6.5–8.1)

## 2021-02-11 LAB — RESP PANEL BY RT-PCR (FLU A&B, COVID) ARPGX2
Influenza A by PCR: NEGATIVE
Influenza B by PCR: NEGATIVE
SARS Coronavirus 2 by RT PCR: NEGATIVE

## 2021-02-11 LAB — TROPONIN I (HIGH SENSITIVITY): Troponin I (High Sensitivity): 37 ng/L — ABNORMAL HIGH (ref <18)

## 2021-02-11 LAB — MAGNESIUM: Magnesium: 2.5 mg/dL — ABNORMAL HIGH (ref 1.7–2.4)

## 2021-02-11 MED ORDER — LORAZEPAM 1 MG PO TABS
1.0000 mg | ORAL_TABLET | Freq: Once | ORAL | Status: AC
  Administered 2021-02-11: 1 mg via ORAL
  Filled 2021-02-11: qty 1

## 2021-02-11 MED ORDER — LIDOCAINE HCL 2 % IJ SOLN: 0.0000 mL | Freq: Once | INTRAMUSCULAR | Status: DC | PRN

## 2021-02-11 MED ORDER — LIDOCAINE HCL (PF) 2 % IJ SOLN: 0.0000 mL | Freq: Once | INTRAMUSCULAR | Status: DC | PRN

## 2021-02-11 MED ORDER — SODIUM ZIRCONIUM CYCLOSILICATE 10 G PO PACK
10.0000 g | PACK | Freq: Once | ORAL | Status: AC
  Administered 2021-02-11: 10 g via ORAL
  Filled 2021-02-11: qty 1

## 2021-02-11 MED ORDER — LIDOCAINE HCL (PF) 1 % IJ SOLN
5.0000 mL | Freq: Once | INTRAMUSCULAR | Status: AC
  Administered 2021-02-11: 5 mL via INTRADERMAL
  Filled 2021-02-11: qty 5

## 2021-02-11 NOTE — ED Triage Notes (Addendum)
Pt BIB EMS from dialysis. Arrived at [facility] in [facility] ED for bleeding site, d/c, went to dialysis this morning & noted to have bleeding at dialysis catheter site & and came back into ED.  EMS vitals: BP 150 palpated 100% RA Pulse 73

## 2021-02-11 NOTE — Progress Notes (Signed)
[facility] SHORT STAY H&P  CC: ESRD   HPI: [name] is a 55 y.o. male who has been on HD through  functioning Hemodialysis access in the right upper extremity. They are here for HD catheter removal. Pt. denies signs of steal syndrome.  Past Medical History:  Diagnosis Date  . Abscess of right foot   . Acute osteomyelitis of right foot ([place]) 05/15/2019  . Anemia 06/23/2019  . Cellulitis of right foot 05/22/2019  . Chest pain 08/23/2013  . Chills    at night - sometimes  . Complication of anesthesia   . Coronary artery disease   . Diabetes mellitus    controlled by diet  . 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 Diabetic foot ulcer- right second toe 05/14/2019  . Diabetic infection of right foot ([place])   . Diabetic retinopathy associated with type 2 diabetes mellitus ([place]) 06/23/2019  . Eczema   . ESRD on hemodialysis ([place]) 07/18/2011  . Essential hypertension 10/18/2007   Qualifier: Diagnosis of  By: Loanne Drilling MD, Jacelyn Pi   . Fatigue    loss of fatigue  . FOOT ULCER, LEFT 06/29/2008   Qualifier: Diagnosis of  By: Loanne Drilling MD, Sean A   . Gangrene of toe of right foot ([place])   . Headache(784.0)    Years ago  . Heart murmur    years ago  . History of blood transfusion   . Hyperglycemia 05/05/2015  . HYPERLIPIDEMIA 05/16/2008   Qualifier: Diagnosis of  By: Jenny Reichmann MD, Hunt Oris   . Hypoglycemia, unspecified 11/13/2008   Qualifier: Diagnosis of  By: Jenny Reichmann MD, Hunt Oris   . MRSA (methicillin resistant staph aureus) culture positive   . MRSA INFECTION 10/18/2007   Qualifier: Diagnosis of  By: Marca Ancona RMA, Lucy    . Myocardial infarction ([place])   . Obesity, unspecified 08/23/2013  . Osteomyelitis of great toe of right foot ([name]) 05/14/2019  . Other forms of retinal detachment(361.89) 06/23/2019  . PONV (postoperative nausea and vomiting)   . Poor circulation   . Secondary hyperparathyroidism ([place]) 06/23/2019  . Sepsis ([place]) 05/14/2019  . Type 2 diabetes mellitus ([place])  08/23/2013  . Ulcer of right foot, with necrosis of bone ([place])   . Ulcer of right second toe with necrosis of bone ([facility])     Family Hx Family History  Problem Relation Age of Onset  . Diabetes Mother     Social HX Social History   Tobacco Use  . Smoking status: Never Smoker  . Smokeless tobacco: Never Used  Vaping Use  . Vaping Use: Never used  Substance Use Topics  . Alcohol use: No  . Drug use: No    Allergies Allergies  Allergen Reactions  . Atorvastatin     Other reaction(s): Muscle Pain  . Rosuvastatin     Other reaction(s): Muscle Pain  . Amoxicillin Itching    Did it involve swelling of the face/tongue/throat, SOB, or low BP? Unknown Did it involve sudden or severe rash/hives, skin peeling, or any reaction on the inside of your mouth or nose? Unknown Did you need to seek medical attention at a hospital or doctor's office? Unknown When did it last happen?20 years ago If all above answers are "NO", may proceed with cephalosporin use.   . Chlorhexidine Hives    Patient has blistering  . Hydromorphone Itching    Patient states he may take with benadryl. Makes feet itch.  Marland Kitchen Percocet [Oxycodone-Acetaminophen] Itching and Other (See  Comments)    Legs only. Itching and burning feeling.  . Gabapentin Nausea And Vomiting    Stomach issues     Medications Current Facility-Administered Medications  Medication Dose Route Frequency Provider Last Rate Last Admin  . lidocaine (XYLOCAINE) 2 % (with pres) injection 0-400 mg  0-20 mL Intradermal Once PRN Davonna Belling, MD       Current Outpatient Medications  Medication Sig Dispense Refill  . acetaminophen-codeine (TYLENOL #3) 300-30 MG tablet Take 1 tablet by mouth every 6 (six) hours as needed. (Patient taking differently: Take 1 tablet by mouth every 6 (six) hours as needed for moderate pain.) 10 tablet 0  . amLODipine (NORVASC) 10 MG tablet Take 10 mg by mouth at bedtime.     Marland Kitchen aspirin EC 81 MG tablet Take 1  tablet (81 mg total) by mouth daily. 90 tablet 3  . blood glucose meter kit and supplies Dispense based on patient and insurance preference. Use up to four times daily as directed. (FOR ICD-10 E10.9, E11.9). 1 each 0  . blood glucose meter kit and supplies Dispense based on patient and insurance preference. Use up to four times daily as directed. (FOR ICD-9 250.00, 250.01). 1 each 0  . BRILINTA 90 MG TABS tablet Take 90 mg by mouth 2 (two) times daily.    . Continuous Blood Gluc Sensor (FREESTYLE LIBRE 14 DAY SENSOR) MISC See admin instructions.    Marland Kitchen glucose blood (ACCU-CHEK AVIVA PLUS) test strip Use as instructed 100 each 1  . hydrALAZINE (APRESOLINE) 50 MG tablet Take 50 mg by mouth 3 (three) times daily.    . isosorbide dinitrate (ISORDIL) 20 MG tablet Take 20 mg by mouth 3 (three) times daily.    Elmore Guise Devices (ACCU-CHEK SOFTCLIX) lancets Use as instructed 100 each 5  . lidocaine-prilocaine (EMLA) cream Apply 1 application topically as needed. (Patient taking differently: Apply 1 application topically as needed (port access).) 30 g 6  . LOKELMA 10 g PACK packet SMARTSIG:1 Packet(s) By Mouth 6 Times a Week    . propranolol (INDERAL) 80 MG tablet Take 1 tablet (80 mg total) by mouth 2 (two) times daily. 60 tablet 1  . sevelamer carbonate (RENVELA) 800 MG tablet Take 1,600 mg by mouth 3 (three) times daily with meals.    . ticagrelor (BRILINTA) 90 MG TABS tablet TAKE 1 TABLET (90 MG TOTAL) BY MOUTH TWO TIMES DAILY. 60 tablet 2    Labs COAG Lab Results  Component Value Date   INR 1.14 06/18/2011   INR 1.40 06/10/2011   INR 1.0 08/10/2008   No results found for: PTT  PHYSICAL EXAM  Vitals:   02/11/21 0815 02/11/21 0845  BP: (!) 181/94 (!) 167/94  Pulse: 77 79  Resp: 19 20  Temp:    SpO2: 99% 92%    General:  WDWN in NAD HENT: WNL Eyes: Pupils equal Pulmonary: normal non-labored breathing  Cardiac: RRR, Skin: normal, no cyanosis, jaundice, pallor or bruising Vascular  Exam/Pulses: 2+  pulses in Bilateral extremity. Extremities without ischemic changes, no Gangrene , no cellulitis; no open wounds;   There is a good thrill and good bruit in the right UE access. Hand grip is 5/5 and sensation in digits is intact;   Impression: This is a 55 y.o. male who has a functioning HD access.  Plan: Removal of Left IJ HD catheter Roxy Horseman 02/11/2021 9:05 AM  [facility] Catheter Removal Procedure Note  Diagnosis: ESRD with Functioning AVF/AVGG  Plan:  Remove left diatek catheter  Consent signed:  yes Time out completed:  yes Coumadin:  No. PT/INR (if applicable):   Other labs:   Procedure: 1.  Sterile prepping and draping over catheter area 2. 5 ml 2% lidocaine plain instilled at removal site. 3.  left catheter removed in its entirety with cuff in tact. 4.  Complications: none  5. Tip of catheter sent for culture:  no   Patient tolerated procedure well:  yes Pressure held, no bleeding noted, dressing applied Instructions given to the pt regarding wound care and bleeding.  Other:  Roxy Horseman 02/11/2021 9:06 AM

## 2021-02-11 NOTE — Consult Note (Signed)
Requesting consult: [facility] emergency room    Patient is a 55 year old male with end-stage renal disease.  He was having bleeding from his left-sided dialysis catheter earlier today.  It was noted in the ER to have a crack in the material which is external to his skin.  There is currently no ongoing bleeding.  Patient states they tried to cannulate his right upper arm access earlier today and were unsuccessful.  His usual dialysis day is Monday Wednesday Friday.  His potassium today apparently was 5.5.  I spoke with the charge nurse at his dialysis center on [address].  She stated that she thinks the fistula should be usable.  They will try a different cannulation tech later today.  He has an appointment at hemodialysis on [facility] at noon today.  Physical exam:  Vitals:   02/11/21 0631 02/11/21 0635  BP:  (!) 188/97  Pulse:  71  Resp:  16  Temp:  98.3 F (36.8 C)  TempSrc:  Oral  SpO2:  98%  Weight: 86.2 kg   Height: '5\' 10"'$  (1.778 m)     Extremities: Right upper extremity well-healed incisions palpable thrill throughout the entire course of the fistula fistula is obvious across the entire upper segment of the arm  Neck: Left side dialysis catheter exiting the left infraclavicular space.  There is an obvious crack in the catheter with no ongoing bleeding.  Assessment: Broken left side dialysis catheter.  I spoke with the [facility].  They feel that they can successfully cannulate his fistula today.  It has been 7 weeks since his fistula was revised and it should be ready for cannulation at this point.  Plan: We will remove his tunneled dialysis catheter later this morning.  One of our PAs will be by to remove this.  Discussed with the patient that he needs to go back to his hemodialysis center and has an appointment at noon today.  Ruta Hinds, MD [facility] [place] Office: [phone]

## 2021-02-11 NOTE — ED Provider Notes (Signed)
[facility] EMERGENCY DEPARTMENT Provider Note   CSN: [id number] Arrival date & time: 02/11/21  0973     History No chief complaint on file.   [name] is a 55 y.o. male.  HPI   Patient with significant medical history of CAD, diabetes, osteomyelitis of the foot, end-stage renal disease currently on hemodialysis Monday Wednesday Friday presents with chief complaint of bleeding from his catheter site.  Patient endorses that he went to his dialysis center today, they were unable to use his right fistula and attempted to use the catheter up on his left chest.  He had diffuse bleeding from the the catheter, they clamped it and he was sent here for further evaluation, patient has not received his treatment today.  Patient was seen here yesterday for similar complaint, x-ray was performed, catheter was in normal placement,  no bleeding present patient was later discharged.  patient states that yesterday his wife was trying to remove some tape around the catheter site, she used scissors and he thinks she may have cut the end of the tube.  He denies any other trauma to the area, denies drainage or discharge.  Patient has no other complaints.  Patient denies headaches, fevers, chills, shortness of breath, chest pain, abdominal pain, nausea, vomiting, diarrhea, worsening pedal edema.  Past Medical History:  Diagnosis Date  . Abscess of right foot   . Acute osteomyelitis of right foot ([place]) 05/15/2019  . Anemia 06/23/2019  . Cellulitis of right foot 05/22/2019  . Chest pain 08/23/2013  . Chills    at night - sometimes  . Complication of anesthesia   . Coronary artery disease   . Diabetes mellitus    controlled by diet  . 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 Diabetic foot ulcer- right second toe 05/14/2019  . Diabetic infection of right foot ([place])   . Diabetic retinopathy associated with type 2 diabetes mellitus ([place]) 06/23/2019  . Eczema   . ESRD on  hemodialysis ([place]) 07/18/2011  . Essential hypertension 10/18/2007   Qualifier: Diagnosis of  By: Loanne Drilling MD, Jacelyn Pi   . Fatigue    loss of fatigue  . FOOT ULCER, LEFT 06/29/2008   Qualifier: Diagnosis of  By: Loanne Drilling MD, Sean A   . Gangrene of toe of right foot ([place])   . Headache(784.0)    Years ago  . Heart murmur    years ago  . History of blood transfusion   . Hyperglycemia 05/05/2015  . HYPERLIPIDEMIA 05/16/2008   Qualifier: Diagnosis of  By: Jenny Reichmann MD, Hunt Oris   . Hypoglycemia, unspecified 11/13/2008   Qualifier: Diagnosis of  By: Jenny Reichmann MD, Hunt Oris   . MRSA (methicillin resistant staph aureus) culture positive   . MRSA INFECTION 10/18/2007   Qualifier: Diagnosis of  By: Marca Ancona RMA, Lucy    . Myocardial infarction ([place])   . Obesity, unspecified 08/23/2013  . Osteomyelitis of great toe of right foot ([place]) 05/14/2019  . Other forms of retinal detachment(361.89) 06/23/2019  . PONV (postoperative nausea and vomiting)   . Poor circulation   . Secondary hyperparathyroidism ([place]) 06/23/2019  . Sepsis ([place]) 05/14/2019  . Type 2 diabetes mellitus ([place]) 08/23/2013  . Ulcer of right foot, with necrosis of bone ([place])   . Ulcer of right second toe with necrosis of bone [facility])     Patient Active Problem List   Diagnosis Date Noted  . Hyperkalemia 01/09/2021  . ESRD (end stage renal disease) ([place])   .  Dialysis AV fistula malfunction, initial encounter ([place]) 01/07/2021  . NSTEMI (non-ST elevated myocardial infarction) ([place]) 09/21/2020  . 3-vessel coronary artery disease 08/09/2020  . Pulmonary hypertension, unspecified ([place]) 08/09/2020  . Pancytopenia ([place]) 08/06/2020  . Allergy, unspecified, subsequent encounter 06/28/2020  . Anaphylactic shock, unspecified, subsequent encounter 06/28/2020  . Lymphedema of both lower extremities 12/29/2019  . Hyperphosphatemia 12/11/2019  . Encounter for post surgical wound check 08/16/2019  . Post-op pain 08/16/2019  . S/P transmetatarsal amputation of foot,  right ([place]) 08/16/2019  . Unspecified protein-calorie malnutrition ([place]) 07/12/2019  . PAD (peripheral artery disease) ([place]) 06/30/2019  . Anemia 06/23/2019  . Diabetic retinopathy associated with type 2 diabetes mellitus ([place]) 06/23/2019  . Other forms of retinal detachment(361.89) 06/23/2019  . Secondary hyperparathyroidism ([place]) 06/23/2019  . Osteomyelitis, unspecified ([place]) 05/31/2019  . Gangrene of toe of right foot ([place])   . Ulcer of right foot, with necrosis of bone ([place])   . Abscess of right foot   . Diabetic infection of right foot ([place])   . Ulcer of right second toe with necrosis of bone ([place])   . Cellulitis of right foot 05/22/2019  . Acute osteomyelitis of right foot ([place]) 05/15/2019  . Osteomyelitis of great toe of right foot ([place]) 05/14/2019  . Diabetic foot ulcer- right second toe 05/14/2019  . Sepsis ([place]) 05/14/2019  . Hypothyroidism, unspecified 01/11/2018  . Hypercalcemia 06/14/2016  . Hyperlipidemia 05/11/2015  . Hyperglycemia 05/05/2015  . Diarrhea, unspecified 02/01/2015  . Fever, unspecified 02/01/2015  . Pain, unspecified 02/01/2015  . Pruritus, unspecified 02/01/2015  . Coagulation defect, unspecified ([place]) 07/07/2014  . Encounter for removal of sutures 05/30/2014  . Encounter for immunization 04/19/2014  . Chest pain 08/23/2013  . Type 2 diabetes mellitus ([place]) 08/23/2013  . Hemodialysis status ([place]) 08/23/2013  . Chronic kidney disease, stage V ([place]) 08/23/2013  . Obesity, unspecified 08/23/2013  . Complication of vascular dialysis catheter 12/15/2012  . Other fluid overload 12/07/2012  . Other specified complication of vascular prosthetic devices, implants and grafts, subsequent encounter 08/06/2012  . ESRD on dialysis ([place]) 07/18/2011  . Pure hypercholesterolemia 06/23/2011  . Iron deficiency anemia, unspecified 06/21/2011  . HYPOGLYCEMIA, UNSPECIFIED 11/13/2008  . RENAL INSUFFICIENCY 07/31/2008  . FOOT ULCER, LEFT 06/29/2008  . HYPERLIPIDEMIA  05/16/2008  . MRSA INFECTION 10/18/2007  . Essential hypertension 10/18/2007    Past Surgical History:  Procedure Laterality Date  . ABDOMINAL AORTOGRAM N/A 05/17/2019   Procedure: ABDOMINAL AORTOGRAM;  Surgeon: Elam Dutch, MD;  Location: [place] [facility] LAB;  Service: Cardiovascular;  Laterality: N/A;  . AMPUTATION TOE Right 05/14/2019   Procedure: PARTIAL AMPUTATION SECOND TOE RIGHT FOOT;  Surgeon: Evelina Bucy, DPM;  Location: [place];  Service: Podiatry;  Laterality: Right;  . AMPUTATION TOE Right 05/23/2019   Procedure: AMPUTATION OF SECOND TOE METATARSAL PHALANGEAL JOINT;  Surgeon: Evelina Bucy, DPM;  Location: [place];  Service: Podiatry;  Laterality: Right;  . AMPUTATION TOE Right 05/27/2019   Procedure: Amputation Toe;  Surgeon: Evelina Bucy, DPM;  Location: [place];  Service: Podiatry;  Laterality: Right;  right third toe  . APPLICATION OF WOUND VAC Right 05/27/2019   Procedure: Application Of Wound Vac;  Surgeon: Evelina Bucy, DPM;  Location: [place];  Service: Podiatry;  Laterality: Right;  . AV FISTULA PLACEMENT  06-18-11   Right brachiocephalic AVF  . BONE BIOPSY Right 05/14/2019   Procedure: OPEN SUPERFICIAL BONE BIOPSY GREAT TOE;  Surgeon: Evelina Bucy, DPM;  Location:  [place] OR;  Service: Podiatry;  Laterality: Right;  . CORONARY BALLOON ANGIOPLASTY N/A 09/24/2020   Procedure: CORONARY BALLOON ANGIOPLASTY;  Surgeon: Martinique, Peter M, MD;  Location: [place] CV LAB;  Service: Cardiovascular;  Laterality: N/A;  . CORONARY STENT INTERVENTION N/A 09/24/2020   Procedure: CORONARY STENT INTERVENTION;  Surgeon: Martinique, Peter M, MD;  Location: [place] CV LAB;  Service: Cardiovascular;  Laterality: N/A;  . EYE SURGERY     left eye for Laser, diabetic retinopathy  . FISTULOGRAM Right 11/06/2011   Procedure: FISTULOGRAM;  Surgeon: Conrad Everest, MD;  Location: [facility] C[facility] LAB;  Service: Cardiovascular;  Laterality: Right;  . HEMATOMA EVACUATION Right Feb. 25, 2014  . HEMATOMA  EVACUATION Right 12/14/2012   Procedure: EVACUATION HEMATOMA;  Surgeon: Conrad Ringsted, MD;  Location: [place];  Service: Vascular;  Laterality: Right;  . INCISION AND DRAINAGE Right 05/23/2019   Procedure: INCISION AND DRAINAGE OF RIGHT FOOT DEEP SPACE ABSCESS;  Surgeon: Evelina Bucy, DPM;  Location: [place];  Service: Podiatry;  Laterality: Right;  . INSERTION OF DIALYSIS CATHETER  12/14/2012   Procedure: INSERTION OF DIALYSIS CATHETER;  Surgeon: Conrad Gifford, MD;  Location: [place];  Service: Vascular;;  . INSERTION OF DIALYSIS CATHETER Right 01/07/2021   Procedure: INSERTION OF DIALYSIS CATHETER;  Surgeon: Elam Dutch, MD;  Location: [place];  Service: Vascular;  Laterality: Right;  . INSERTION OF DIALYSIS CATHETER Left 01/08/2021   Procedure: INSERTION OF LEFT INTERNAL JUGULAR DIALYSIS CATHETER, tunneled;  Surgeon: Angelia Mould, MD;  Location: [place];  Service: Vascular;  Laterality: Left;  . INTRAVASCULAR PRESSURE WIRE/FFR STUDY N/A 09/24/2020   Procedure: INTRAVASCULAR PRESSURE WIRE/FFR STUDY;  Surgeon: Martinique, Peter M, MD;  Location: [name] CV LAB;  Service: Cardiovascular;  Laterality: N/A;  . IRRIGATION AND DEBRIDEMENT FOOT Right 05/25/2019   Procedure: INCISION AND DRAINAGE FOOT;  Surgeon: Evelina Bucy, DPM;  Location: [place];  Service: Podiatry;  Laterality: Right;  . IRRIGATION AND DEBRIDEMENT FOOT Right 05/27/2019   Procedure: IRRIGATION AND DEBRIDEMENT FOOT;  Surgeon: Evelina Bucy, DPM;  Location: [place];  Service: Podiatry;  Laterality: Right;  . LEFT HEART CATH AND CORONARY ANGIOGRAPHY N/A 09/24/2020   Procedure: LEFT HEART CATH AND CORONARY ANGIOGRAPHY;  Surgeon: Martinique, Peter M, MD;  Location: [place] CV LAB;  Service: Cardiovascular;  Laterality: N/A;  . LOWER EXTREMITY ANGIOGRAPHY Bilateral 05/17/2019   Procedure: LOWER EXTREMITY ANGIOGRAPHY;  Surgeon: Elam Dutch, MD;  Location: [place] [facility] LAB;  Service: Cardiovascular;  Laterality: Bilateral;  . REMOVAL  OF A DIALYSIS CATHETER Right 01/08/2021   Procedure: REMOVAL OF TEMPORARY DIALYSIS CATHETER, LEFT FEMORAL VEIN;  Surgeon: Angelia Mould, MD;  Location: [place];  Service: Vascular;  Laterality: Right;  . REVISON OF ARTERIOVENOUS FISTULA Right 12/14/2012   Procedure: REVISON OF upper arm ARTERIOVENOUS FISTULA using 80mx10cm gortex graft;  Surgeon: BConrad Portsmouth MD;  Location: [place]  Service: Vascular;  Laterality: Right;  . REVISON OF ARTERIOVENOUS FISTULA Right 12/27/2020   Procedure: RIGHT UPPER EXTREMITY ARTERIOVENOUS FISTULA REVISiON;  Surgeon: HCherre Robins MD;  Location: [place]  Service: Vascular;  Laterality: Right;  PERIPHERAL NERVE BLOCK  . RIGHT/LEFT HEART CATH AND CORONARY ANGIOGRAPHY N/A 08/07/2020   Procedure: RIGHT/LEFT HEART CATH AND CORONARY ANGIOGRAPHY;  Surgeon: JMartinique Peter M, MD;  [facility] [place] [facility] [facility];  Service: Cardiovascular;  Laterality: N/A;  . SOFT TISSUE MASS EXCISION     Right arm, Left leg  for MRSA infection       Family History  Problem Relation Age of Onset  . Diabetes Mother     Social History   Tobacco Use  . Smoking status: Never Smoker  . Smokeless tobacco: Never Used  Vaping Use  . Vaping Use: Never used  Substance Use Topics  . Alcohol use: No  . Drug use: No    Home Medications Prior to Admission medications   Medication Sig Start Date End Date Taking? Authorizing Provider  acetaminophen-codeine (TYLENOL #3) 300-30 MG tablet Take 1 tablet by mouth every 6 (six) hours as needed. Patient taking differently: Take 1 tablet by mouth every 6 (six) hours as needed for moderate pain. 12/27/20   Dagoberto Ligas, PA-C  amLODipine (NORVASC) 10 MG tablet Take 10 mg by mouth at bedtime.  08/03/20   [provider]  aspirin EC 81 MG tablet Take 1 tablet (81 mg total) by mouth daily. 06/28/19   Wellington Hampshire, MD  blood glucose meter kit and supplies Dispense based on patient and insurance preference. Use up to four times daily  as directed. (FOR ICD-10 E10.9, E11.9). 05/18/19   Mikhail, Velta Addison, DO  blood glucose meter kit and supplies Dispense based on patient and insurance preference. Use up to four times daily as directed. (FOR ICD-9 250.00, 250.01). 05/18/19   Cristal Ford, DO  BRILINTA 90 MG TABS tablet Take 90 mg by mouth 2 (two) times daily. 12/26/20   [provider]  Continuous Blood Gluc Sensor (FREESTYLE LIBRE 14 DAY SENSOR) MISC See admin instructions. 06/20/19   [provider]  glucose blood (ACCU-CHEK AVIVA PLUS) test strip Use as instructed 05/18/19   Cristal Ford, DO  hydrALAZINE (APRESOLINE) 50 MG tablet Take 50 mg by mouth 3 (three) times daily. 12/18/20   [provider]  isosorbide dinitrate (ISORDIL) 20 MG tablet Take 20 mg by mouth 3 (three) times daily. 08/24/20   [provider]  Lancet Devices [facility]) lancets Use as instructed 05/18/19   Cristal Ford, DO  lidocaine-prilocaine (EMLA) cream Apply 1 application topically as needed. Patient taking differently: Apply 1 application topically as needed (port access). 01/02/21   Cherre Robins, MD  LOKELMA 10 g PACK packet SMARTSIG:1 Packet(s) By Mouth 6 Times a Week 01/14/21   [provider]  propranolol (INDERAL) 80 MG tablet Take 1 tablet (80 mg total) by mouth 2 (two) times daily. 09/26/20   Hongalgi, Lenis Dickinson, MD  sevelamer carbonate (RENVELA) 800 MG tablet Take 1,600 mg by mouth 3 (three) times daily with meals. 07/25/20   [provider]  ticagrelor (BRILINTA) 90 MG TABS tablet TAKE 1 TABLET (90 MG TOTAL) BY MOUTH TWO TIMES DAILY. 09/26/20 09/26/21  Modena Jansky, MD  loratadine (CLARITIN) 10 MG tablet Take 10 mg by mouth daily as needed for allergies.  11/06/19  [provider]    Allergies    Atorvastatin, Rosuvastatin, Amoxicillin, Chlorhexidine, Hydromorphone, Percocet [oxycodone-acetaminophen], and Gabapentin  Review of Systems   Review of Systems   Constitutional: Negative for chills and fever.  HENT: Negative for congestion.   Respiratory: Negative for shortness of breath.   Cardiovascular: Negative for chest pain.  Gastrointestinal: Negative for abdominal pain, diarrhea, nausea and vomiting.  Genitourinary: Negative for enuresis.  Musculoskeletal: Negative for back pain.  Skin: Negative for rash.  Neurological: Negative for headaches.  Hematological: Does not bruise/bleed easily.    Physical Exam Updated Vital Signs BP (!) 151/97   Pulse 80  Temp 98.3 F (36.8 C) (Oral)   Resp 10   Ht _0  (1.778 m)   Wt 86.2 kg   SpO2 93%   BMI 27.26 kg/m   Physical Exam Vitals and nursing note reviewed.  Constitutional:      General: He is not in acute distress.    Appearance: He is not ill-appearing.  HENT:     Head: Normocephalic and atraumatic.     Nose: No congestion.  Eyes:     Conjunctiva/sclera: Conjunctivae normal.  Cardiovascular:     Rate and Rhythm: Normal rate and regular rhythm.     Pulses: Normal pulses.     Heart sounds: No murmur heard. No friction rub. No gallop.   Pulmonary:     Effort: No respiratory distress.     Breath sounds: No wheezing, rhonchi or rales.  Abdominal:     Palpations: Abdomen is soft.     Tenderness: There is no abdominal tenderness.  Musculoskeletal:     Right lower leg: No edema.     Left lower leg: No edema.  Skin:    General: Skin is warm and dry.     Comments: Patient is a noted fistula on his right AC, palpable thrill noted.  No surrounding erythema or edema.  Patient also has a Port-A-Cath upper left chest, there is no surrounding erythema or edema, no drainage or discharge present.  It was not actively bleeding.  There is tape noted around the distal end of the tube, tape was removed and shows that he has a cut that goes to the lumen.  Neurological:     Mental Status: He is alert.  Psychiatric:        Mood and Affect: Mood normal.     ED Results / Procedures /  Treatments   Labs (all labs ordered are listed, but only abnormal results are displayed) Labs Reviewed  COMPREHENSIVE METABOLIC PANEL - Abnormal; Notable for the following components:      Result Value   Potassium 6.3 (*)    CO2 21 (*)    Glucose, Bld 150 (*)    BUN 76 (*)    Creatinine, Ser 10.20 (*)    Total Bilirubin 1.9 (*)    GFR, Estimated 5 (*)    Anion gap 16 (*)    All other components within normal limits  CBC WITH DIFFERENTIAL/PLATELET - Abnormal; Notable for the following components:   RBC 3.61 (*)    Hemoglobin 9.7 (*)    HCT 32.5 (*)    MCHC 29.8 (*)    RDW 19.8 (*)    Lymphs Abs 0.5 (*)    All other components within normal limits  MAGNESIUM - Abnormal; Notable for the following components:   Magnesium 2.5 (*)    All other components within normal limits  RESP PANEL BY RT-PCR (FLU A&B, COVID) [id number]    EKG EKG Interpretation  Date/Time:  Monday February 11 2021 08:11:28 EDT Ventricular Rate:  77 PR Interval:  161 QRS Duration: 86 QT Interval:  446 QTC Calculation: 505 R Axis:   30 Text Interpretation: Sinus rhythm Consider left ventricular hypertrophy Prolonged QT interval T waves more peaked Confirmed by Davonna Belling [id number]) on 02/11/2021 9:24:51 AM   Radiology DG Chest Port 1 View  Result Date: 02/10/2021 CLINICAL DATA:  Prob with dialysis catheter EXAM: PORTABLE CHEST 1 VIEW COMPARISON:  January 08, 2021. FINDINGS: Unchanged enlarged cardiac silhouette and central vascular prominence. Left approach central venous catheter with tip overlying  the superior cavoatrial junction, unchanged. Right axillary vascular stent. Mild bibasilar interstitial thickening. The visualized skeletal structures are unchanged. IMPRESSION: 1. Unchanged position of the left approach central venous catheter. 2. Stable enlarged cardiac silhouette and central vascular prominence with mild bibasilar interstitial thickening, likely mild edema. Electronically Signed   By: Dahlia Bailiff  MD   On: 02/10/2021 23:31    Procedures Procedures   Medications Ordered in ED Medications  lidocaine (XYLOCAINE) 2 % (with pres) injection 0-400 mg (has no administration in time range)  sodium zirconium cyclosilicate (LOKELMA) packet 10 g (has no administration in time range)  lidocaine (PF) (XYLOCAINE) 1 % injection 5 mL (5 mLs Intradermal Given by Other 02/11/21 4268)    ED Course  I have reviewed the triage vital signs and the nursing notes.  Pertinent labs & imaging results that were available during my care of the patient were reviewed by me and considered in my medical decision making (see chart for details).    MDM Rules/Calculators/A&P                         Initial impression-patient presents with bleeding from his catheter site.  He is alert, does not appear in acute distress, vital signs reassuring.  Patient has a noted defect in his tube, will consult with vascular surgery for further recommendations.  Will obtain basic lab work-up.  Work-up-CBC shows normocytic anemia with a hemoglobin 9.7 appears to be baseline for patient.  CMP shows elevated potassium 6.3, decreased CO2 21, hyperglycemia of 150, BUN elevated 76, creatinine 10.2, T bili 1.9, and gap of 16.  Magnesium 2.5.  EKG sinus rhythm without signs of ischemia slightly peaked T waves.  Reassessment-patient was updated on vascular recommendations, patient is agreeable for discharge pending normal lab work.  He will go to dialysis for his treatment today at noon.  Patient was assessed after catheter was removed, site was not actively bleeding, hemodynamically stable at this time.  Lung sounds are clear bilaterally. updated patient on lab work, notified that his potassium is elevated 6.3 explained that this will be corrected after he has his treatment today, it is imperative that he go straight there for his treatment.  If he does not have his treatment today he will have to come back for treatment to correct his  electrolyte derangements.  Also provide patient with a dose of Lokelma for being DC'd.  Consult-spoke with Dr. Oneida Alar of vascular surgery, he will come down and assess the patient for possible port replacement later today.  Per Dr. Oneida Alar recommendations tunneled dialysis catheter will be removed later today and patient will go back to his dialysis treatment at noon appointments for dialysis.    Rule out-low suspicion for ACS as patient denies chest pain, shortness of breath, no signs of ischemia noted on EKG.  Low suspicion patient would need emergent hemodialysis as patient denies shortness of breath, orthopnea, no signs of fluid overloaded on exam.  Patient does have elevated potassium 6.3 with slightly peaked T waves, patient is scheduled for his hemodialysis at noon patient go straight there for treatment.  Low Suspicion for pneumothorax as patient denies chest pain, shortness of breath, lung sounds were clear bilaterally.  Patient had chest x-ray performed yesterday was unremarkable.  Plan-  1. Bleeding catheter-patient's dialysis catheter suspect secondary due to defect in the lumen, catheter was removed.  2. Hyper kalemia and magnesium  suspect secondary due to missed dialysis treatment, patient will  go straight to Center for his appointment at noon for correction of his electrolytes.  Vital signs have remained stable, no indication for hospital admission.  Patient discussed with attending and they agreed with assessment and plan.  Patient given at home care as well strict return precautions.  Patient verbalized that they understood agreed to said plan.   Final Clinical Impression(s) / ED Diagnoses Final diagnoses:  Complication of vascular dialysis catheter, initial encounter  Hyperkalemia  Hypermagnesemia    Rx / DC Orders ED Discharge Orders    None       Marcello Fennel, PA-C 02/11/21 0944    Davonna Belling, MD 02/11/21 1555

## 2021-02-11 NOTE — ED Notes (Signed)
ED PA made aware of K+ level.

## 2021-02-11 NOTE — Discharge Instructions (Addendum)
Go to dialysis tomorrow as scheduled.  Return to the ER if you experience any new and/or concerning symptoms.

## 2021-02-11 NOTE — Discharge Instructions (Addendum)
Your dialysis catheter was removed today, lab work reveals that you have an elevated potassium and magnesium, this will be corrected after your treatment today.  It is important that you go straight to your dialysis appointment at noon as you will need dialysis today.  If you cannot have your treatment done today you must come back here for further evaluation.  Come back to the emergency department if you develop chest pain, shortness of breath, severe abdominal pain, uncontrolled nausea, vomiting, diarrhea.

## 2021-02-11 NOTE — ED Notes (Signed)
Patient is resting comfortably. 

## 2021-02-17 DIAGNOSIS — N186 End stage renal disease: Secondary | ICD-10-CM | POA: Diagnosis not present

## 2021-02-17 DIAGNOSIS — E1129 Type 2 diabetes mellitus with other diabetic kidney complication: Secondary | ICD-10-CM | POA: Diagnosis not present

## 2021-02-17 DIAGNOSIS — Z992 Dependence on renal dialysis: Secondary | ICD-10-CM | POA: Diagnosis not present

## 2021-02-18 DIAGNOSIS — D509 Iron deficiency anemia, unspecified: Secondary | ICD-10-CM | POA: Diagnosis not present

## 2021-02-18 DIAGNOSIS — D631 Anemia in chronic kidney disease: Secondary | ICD-10-CM | POA: Diagnosis not present

## 2021-02-18 DIAGNOSIS — N186 End stage renal disease: Secondary | ICD-10-CM | POA: Diagnosis not present

## 2021-02-18 DIAGNOSIS — Z992 Dependence on renal dialysis: Secondary | ICD-10-CM | POA: Diagnosis not present

## 2021-02-18 DIAGNOSIS — N2581 Secondary hyperparathyroidism of renal origin: Secondary | ICD-10-CM | POA: Diagnosis not present

## 2021-02-21 ENCOUNTER — Encounter (HOSPITAL_COMMUNITY): Payer: Self-pay

## 2021-02-21 DIAGNOSIS — L97511 Non-pressure chronic ulcer of other part of right foot limited to breakdown of skin: Secondary | ICD-10-CM | POA: Diagnosis not present

## 2021-02-21 DIAGNOSIS — L608 Other nail disorders: Secondary | ICD-10-CM | POA: Diagnosis not present

## 2021-02-21 DIAGNOSIS — Z89431 Acquired absence of right foot: Secondary | ICD-10-CM | POA: Diagnosis not present

## 2021-02-21 DIAGNOSIS — E11621 Type 2 diabetes mellitus with foot ulcer: Secondary | ICD-10-CM | POA: Diagnosis not present

## 2021-03-13 ENCOUNTER — Telehealth (HOSPITAL_COMMUNITY): Payer: Self-pay | Admitting: Pharmacist

## 2021-03-13 NOTE — Telephone Encounter (Signed)
Cardiac Rehab Medication Review by a Pharmacist  Does the patient  feel that his/her medications are working for him/her?  yes  Has the patient been experiencing any side effects to the medications prescribed?  no  Does the patient measure his/her own blood pressure or blood glucose at home?  Yes, most recent BP 140/73, most recent BG before bed was 273 (states he ate "too late" and higher than usual, unable to recall additional readings during call)  Does the patient have any problems obtaining medications due to transportation or finances?   no  Understanding of regimen: Patient's wife Otila Kluver manages meds, patients own understanding is fair Understanding of indications: fair Potential of compliance: good  Pharmacist Intervention: Patient's wife unable to confirm how often patient takes [facility] and if he uses Emla cream. Attempted to reach patient again by phone but he did not answer. Please review during patient's cardiac rehab appointment, thank you!  Fara Olden, PharmD PGY-1 Ambulatory Care Pharmacy Resident 03/15/2021 12:57 PM

## 2021-03-15 ENCOUNTER — Telehealth (HOSPITAL_COMMUNITY): Payer: Self-pay

## 2021-03-19 ENCOUNTER — Encounter (HOSPITAL_COMMUNITY)
Admission: RE | Admit: 2021-03-19 | Discharge: 2021-03-19 | Disposition: A | Discharge status: Home / Self Care | Payer: Medicare Other | Source: Ambulatory Visit | Attending: Cardiology | Admitting: Cardiology

## 2021-03-19 ENCOUNTER — Other Ambulatory Visit: Payer: Self-pay

## 2021-03-19 ENCOUNTER — Encounter (HOSPITAL_COMMUNITY): Payer: Self-pay

## 2021-03-19 VITALS — BP 140/80 | HR 72 | Ht 69.5 in | Wt 195.5 lb

## 2021-03-19 DIAGNOSIS — Z955 Presence of coronary angioplasty implant and graft: Secondary | ICD-10-CM | POA: Insufficient documentation

## 2021-03-19 DIAGNOSIS — I214 Non-ST elevation (NSTEMI) myocardial infarction: Secondary | ICD-10-CM | POA: Insufficient documentation

## 2021-03-19 NOTE — Progress Notes (Signed)
Cardiac Individual Treatment Plan  Patient Details  Name: [name] MRN: [id number] Date of Birth: 28-Sep-1966 Referring Provider:   Flowsheet Row CARDIAC REHAB PHASE II ORIENTATION from 03/19/2021 in [facility]  Referring Provider Dr. Redmond Pulling, MD (Covering)      Initial Encounter Date:  [facility] from 03/19/2021 in [facility]  Date 03/19/21      Visit Diagnosis:  09/21/20 NSTEMI    09/24/21 S/P DES OM2, S/P PCI/ DES D2 at [facility]  Patient's Home Medications on Admission:  Current Outpatient Medications:  .  amLODipine (NORVASC) 10 MG tablet, Take 10 mg by mouth at bedtime. , Disp: , Rfl:  .  aspirin EC 81 MG tablet, Take 1 tablet (81 mg total) by mouth daily., Disp: 90 tablet, Rfl: 3 .  blood glucose meter kit and supplies, Dispense based on patient and insurance preference. Use up to four times daily as directed. (FOR ICD-10 E10.9, E11.9)., Disp: 1 each, Rfl: 0 .  blood glucose meter kit and supplies, Dispense based on patient and insurance preference. Use up to four times daily as directed. (FOR ICD-9 250.00, 250.01)., Disp: 1 each, Rfl: 0 .  Continuous Blood Gluc Sensor (FREESTYLE LIBRE 14 DAY SENSOR) MISC, See admin instructions., Disp: , Rfl:  .  glucose blood (ACCU-CHEK AVIVA PLUS) test strip, Use as instructed, Disp: 100 each, Rfl: 1 .  hydrALAZINE (APRESOLINE) 50 MG tablet, Take 50 mg by mouth 3 (three) times daily., Disp: , Rfl:  .  isosorbide dinitrate (ISORDIL) 20 MG tablet, Take 20 mg by mouth 3 (three) times daily., Disp: , Rfl:  .  Lancet Devices (ACCU-CHEK SOFTCLIX) lancets, Use as instructed, Disp: 100 each, Rfl: 5 .  lidocaine-prilocaine (EMLA) cream, Apply 1 application topically as needed. (Patient taking differently: Apply 1 application topically as needed (port access).), Disp: 30 g, Rfl: 6 .  LOKELMA 10 g PACK packet, Take 10  g by mouth. Patient says he takes on HD days, Disp: , Rfl:  .  propranolol (INDERAL) 80 MG tablet, Take 1 tablet (80 mg total) by mouth 2 (two) times daily., Disp: 60 tablet, Rfl: 1 .  sevelamer carbonate (RENVELA) 800 MG tablet, Take 1,600 mg by mouth 3 (three) times daily with meals., Disp: , Rfl:  .  ticagrelor (BRILINTA) 90 MG TABS tablet, TAKE 1 TABLET (90 MG TOTAL) BY MOUTH TWO TIMES DAILY., Disp: 60 tablet, Rfl: 2  Past Medical History: Past Medical History:  Diagnosis Date  . Abscess of right foot   . Acute osteomyelitis of right foot ([place]) 05/15/2019  . Anemia 06/23/2019  . Cellulitis of right foot 05/22/2019  . Chest pain 08/23/2013  . Chills    at night - sometimes  . Complication of anesthesia   . Coronary artery disease   . Diabetes mellitus    controlled by diet  . 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 Diabetic foot ulcer- right second toe 05/14/2019  . Diabetic infection of right foot ([place])   . Diabetic retinopathy associated with type 2 diabetes mellitus ([place]) 06/23/2019  . Eczema   . ESRD on hemodialysis ([place]) 07/18/2011  . Essential hypertension 10/18/2007   Qualifier: Diagnosis of  By: Loanne Drilling MD, Jacelyn Pi   . Fatigue    loss of fatigue  . FOOT ULCER, LEFT 06/29/2008   Qualifier: Diagnosis of  By: Loanne Drilling MD, Sean A   . Gangrene of toe  of right foot ([place])   . Headache(784.0)    Years ago  . Heart murmur    years ago  . History of blood transfusion   . Hyperglycemia 05/05/2015  . HYPERLIPIDEMIA 05/16/2008   Qualifier: Diagnosis of  By: Jenny Reichmann MD, Hunt Oris   . Hypoglycemia, unspecified 11/13/2008   Qualifier: Diagnosis of  By: Jenny Reichmann MD, Hunt Oris   . MRSA (methicillin resistant staph aureus) culture positive   . MRSA INFECTION 10/18/2007   Qualifier: Diagnosis of  By: Marca Ancona RMA, Lucy    . Myocardial infarction ([place])   . Obesity, unspecified 08/23/2013  . Osteomyelitis of great toe of right foot ([place]) 05/14/2019  . Other forms of retinal detachment(361.89)  06/23/2019  . PONV (postoperative nausea and vomiting)   . Poor circulation   . Secondary hyperparathyroidism ([place]) 06/23/2019  . Sepsis ([place]) 05/14/2019  . Type 2 diabetes mellitus ([place]) 08/23/2013  . Ulcer of right foot, with necrosis of bone ([place])   . Ulcer of right second toe with necrosis of bone ([facility])     Tobacco Use: Social History   Tobacco Use  Smoking Status Never Smoker  Smokeless Tobacco Never Used    Labs: Recent Review Flowsheet Data    Labs for ITP Cardiac and Pulmonary Rehab Latest Ref Rng & Units 09/22/2020 12/27/2020 01/07/2021 01/08/2021 01/08/2021   Cholestrol 0 - 200 mg/dL 132 - - - -   LDLCALC 0 - 99 mg/dL 48 - - - -   LDLDIRECT mg/dL - - - - -   HDL >40 mg/dL 69 - - - -   Trlycerides <150 mg/dL 73 - - - -   Hemoglobin A1c 4.8 - 5.6 % 7.4(H) - - - -   PHART 7.350 - 7.450 - - - - -   PCO2ART 32.0 - 48.0 mmHg - - - - -   HCO3 20.0 - 28.0 mmol/L - - - - -   TCO2 22 - 32 mmol/L - _0 32   O2SAT % - - - - -      Capillary Blood Glucose: Lab Results  Component Value Date   GLUCAP 112 (H) 01/08/2021   GLUCAP 154 (H) 01/08/2021   GLUCAP 100 (H) 01/07/2021   GLUCAP 135 (H) 01/07/2021   GLUCAP 176 (H) 01/07/2021     Exercise Target Goals: Exercise Program Goal: Individual exercise prescription set using results from initial 6 min walk test and THRR while considering  patient's activity barriers and safety.   Exercise Prescription Goal: Starting with aerobic activity 30 plus minutes a day, 3 days per week for initial exercise prescription. Provide home exercise prescription and guidelines that participant acknowledges understanding prior to discharge.  Activity Barriers & Risk Stratification:  Activity Barriers & Cardiac Risk Stratification - 03/19/21 1227      Activity Barriers & Cardiac Risk Stratification   Activity Barriers Arthritis;Back Problems;Joint Problems;Deconditioning;Balance Concerns;History of Falls;Other (comment)    Comments  Transmetatarasal amputation of the right foot. Chroinc bilateral shoulder problems/pain using arms above the head. Pt has ESRD on HD. Dizziness with change of position, especially after HD.    Cardiac Risk Stratification High           6 Minute Walk:  6 Minute Walk    Row Name 03/19/21 1246         6 Minute Walk   Phase Initial     Distance 1378 feet  performed on Nustep     Walk Time 6 minutes     #  of Rest Breaks 0     MPH 2.61     METS 3.63     RPE 7     Perceived Dyspnea  0     VO2 Peak 12.71     Symptoms No     Resting HR 71 bpm     Resting BP 140/80     Resting Oxygen Saturation  99 %     Exercise Oxygen Saturation  during 6 min walk 98 %     Max Ex. HR 75 bpm     Max Ex. BP 138/76     2 Minute Post BP 122/74            Oxygen Initial Assessment:   Oxygen Re-Evaluation:   Oxygen Discharge (Final Oxygen Re-Evaluation):   Initial Exercise Prescription:  Initial Exercise Prescription - 03/19/21 1300      Date of Initial Exercise RX and Referring Provider   Date 03/19/21    Referring Provider Dr. Redmond Pulling, MD (Covering)    Expected Discharge Date 05/17/21      Recumbant Bike   Level 2    RPM 60    Minutes 15    METs 2.2      NuStep   Level 3    SPM 75    Minutes 15    METs 2.2      Prescription Details   Frequency (times per week) 3    Duration Progress to 30 minutes of continuous aerobic without signs/symptoms of physical distress      Intensity   THRR 40-80% of Max Heartrate 66-132    Ratings of Perceived Exertion 11-13    Perceived Dyspnea 0-4      Progression   Progression Continue progressive overload as per policy without signs/symptoms or physical distress.      Resistance Training   Training Prescription Yes    Weight 3 lbs    Reps 10-15           Perform Capillary Blood Glucose checks as needed.  Exercise Prescription Changes:   Exercise Comments:   Exercise Goals and Review:  Exercise Goals    Row  Name 03/19/21 1229             Exercise Goals   Increase Physical Activity Yes       Intervention Provide advice, education, support and counseling about physical activity/exercise needs.;Develop an individualized exercise prescription for aerobic and resistive training based on initial evaluation findings, risk stratification, comorbidities and participant's personal goals.       Expected Outcomes Short Term: Attend rehab on a regular basis to increase amount of physical activity.;Long Term: Add in home exercise to make exercise part of routine and to increase amount of physical activity.;Long Term: Exercising regularly at least 3-5 days a week.       Increase Strength and Stamina Yes       Intervention Provide advice, education, support and counseling about physical activity/exercise needs.;Develop an individualized exercise prescription for aerobic and resistive training based on initial evaluation findings, risk stratification, comorbidities and participant's personal goals.       Expected Outcomes Short Term: Increase workloads from initial exercise prescription for resistance, speed, and METs.;Short Term: Perform resistance training exercises routinely during rehab and add in resistance training at home;Long Term: Improve cardiorespiratory fitness, muscular endurance and strength as measured by increased METs and functional capacity (6MWT)       Able to understand and use rate of perceived exertion (RPE) scale Yes  Intervention Provide education and explanation on how to use RPE scale       Expected Outcomes Short Term: Able to use RPE daily in rehab to express subjective intensity level;Long Term:  Able to use RPE to guide intensity level when exercising independently       Knowledge and understanding of Target Heart Rate Range (THRR) Yes       Intervention Provide education and explanation of THRR including how the numbers were predicted and where they are located for reference        Expected Outcomes Short Term: Able to state/look up THRR;Short Term: Able to use daily as guideline for intensity in rehab;Long Term: Able to use THRR to govern intensity when exercising independently       Understanding of Exercise Prescription Yes       Intervention Provide education, explanation, and written materials on patient's individual exercise prescription       Expected Outcomes Short Term: Able to explain program exercise prescription;Long Term: Able to explain home exercise prescription to exercise independently              Exercise Goals Re-Evaluation :    Discharge Exercise Prescription (Final Exercise Prescription Changes):   Nutrition:  Target Goals: Understanding of nutrition guidelines, daily intake of sodium '1500mg'$ , cholesterol '200mg'$ , calories 30% from fat and 7% or less from saturated fats, daily to have 5 or more servings of fruits and vegetables.  Biometrics:  Pre Biometrics - 03/19/21 1023      Pre Biometrics   Waist Circumference 42 inches    Hip Circumference 40.5 inches    Waist to Hip Ratio 1.04 %    Triceps Skinfold 10 mm    % Body Fat 26.6 %    Grip Strength 36 kg    Flexibility 13.5 in    Single Leg Stand 2.32 seconds            Nutrition Therapy Plan and Nutrition Goals:   Nutrition Assessments:  MEDIFICTS Score Key:  ?70 Need to make dietary changes   40-70 Heart Healthy Diet  ? 40 Therapeutic Level Cholesterol Diet   Picture Your Plate Scores:  <66 Unhealthy dietary pattern with much room for improvement.  41-50 Dietary pattern unlikely to meet recommendations for good health and room for improvement.  51-60 More healthful dietary pattern, with some room for improvement.   >60 Healthy dietary pattern, although there may be some specific behaviors that could be improved.    Nutrition Goals Re-Evaluation:   Nutrition Goals Discharge (Final Nutrition Goals Re-Evaluation):   Psychosocial: Target Goals: Acknowledge  presence or absence of significant depression and/or stress, maximize coping skills, provide positive support system. Participant is able to verbalize types and ability to use techniques and skills needed for reducing stress and depression.  Initial Review & Psychosocial Screening:  Initial Psych Review & Screening - 03/19/21 1151      Initial Review   Current issues with Current Stress Concerns    Source of Stress Concerns Chronic Illness;Unable to participate in former interests or hobbies;Unable to perform yard/household activities    Comments Roniel receives HD on Monday, Wednesday and Fridays      [place]? Yes   Jaquaveon has his wife for support     Barriers   Psychosocial barriers to participate in program The patient should benefit from training in stress management and relaxation.      Screening Interventions   Interventions Encouraged to exercise  Expected Outcomes Long Term Goal: Stressors or current issues are controlled or eliminated.           Quality of Life Scores:  Quality of Life - 03/19/21 1245      Quality of Life   Select Quality of Life      Quality of Life Scores   Health/Function Pre 30 %    Socioeconomic Pre 30 %    Psych/Spiritual Pre 30 %    Family Pre 30 %    GLOBAL Pre 30 %          Scores of 19 and below usually indicate a poorer quality of life in these areas.  A difference of  2-3 points is a clinically meaningful difference.  A difference of 2-3 points in the total score of the Quality of Life Index has been associated with significant improvement in overall quality of life, self-image, physical symptoms, and general health in studies assessing change in quality of life.  PHQ-9: Recent Review Flowsheet Data    Depression screen [facility] 2/9 03/19/2021 05/10/2015   Decreased Interest 0 0   Down, Depressed, Hopeless 0 0   PHQ - 2 Score 0 0     Interpretation of Total Score  Total Score Depression Severity:  1-4 =  Minimal depression, 5-9 = Mild depression, 10-14 = Moderate depression, 15-19 = Moderately severe depression, 20-27 = Severe depression   Psychosocial Evaluation and Intervention:   Psychosocial Re-Evaluation:   Psychosocial Discharge (Final Psychosocial Re-Evaluation):   Vocational Rehabilitation: Provide vocational rehab assistance to qualifying candidates.   Vocational Rehab Evaluation & Intervention:  Vocational Rehab - 03/19/21 1155      Initial Vocational Rehab Evaluation & Intervention   Assessment shows need for Vocational Rehabilitation No   Kwadwo is on disabilty and does not need vocational rehab at this time          Education: Education Goals: Education classes will be provided on a weekly basis, covering required topics. Participant will state understanding/return demonstration of topics presented.  Learning Barriers/Preferences:  Learning Barriers/Preferences - 03/19/21 1231      Learning Barriers/Preferences   Learning Barriers Sight   wears glasses   Learning Preferences Audio;Verbal Instruction;Video;Written Material;Computer/Internet;Group Instruction;Individual Instruction;Pictoral;Skilled Demonstration           Education Topics: Hypertension, Hypertension Reduction -Define heart disease and high blood pressure. Discus how high blood pressure affects the body and ways to reduce high blood pressure.   Exercise and Your Heart -Discuss why it is important to exercise, the FITT principles of exercise, normal and abnormal responses to exercise, and how to exercise safely.   Angina -Discuss definition of angina, causes of angina, treatment of angina, and how to decrease risk of having angina.   Cardiac Medications -Review what the following cardiac medications are used for, how they affect the body, and side effects that may occur when taking the medications.  Medications include Aspirin, Beta blockers, calcium channel blockers, ACE Inhibitors,  angiotensin receptor blockers, diuretics, digoxin, and antihyperlipidemics.   Congestive Heart Failure -Discuss the definition of CHF, how to live with CHF, the signs and symptoms of CHF, and how keep track of weight and sodium intake.   Heart Disease and Intimacy -Discus the effect sexual activity has on the heart, how changes occur during intimacy as we age, and safety during sexual activity.   Smoking Cessation / COPD -Discuss different methods to quit smoking, the health benefits of quitting smoking, and the definition of COPD.  Nutrition I: Fats -Discuss the types of cholesterol, what cholesterol does to the heart, and how cholesterol levels can be controlled.   Nutrition II: Labels -Discuss the different components of food labels and how to read food label   Heart Parts/Heart Disease and PAD -Discuss the anatomy of the heart, the pathway of blood circulation through the heart, and these are affected by heart disease.   Stress I: Signs and Symptoms -Discuss the causes of stress, how stress may lead to anxiety and depression, and ways to limit stress.   Stress II: Relaxation -Discuss different types of relaxation techniques to limit stress.   Warning Signs of Stroke / TIA -Discuss definition of a stroke, what the signs and symptoms are of a stroke, and how to identify when someone is having stroke.   Knowledge Questionnaire Score:  Knowledge Questionnaire Score - 03/19/21 1309      Knowledge Questionnaire Score   Pre Score 22/24           Core Components/Risk Factors/Patient Goals at Admission:  Personal Goals and Risk Factors at Admission - 03/19/21 1309      Core Components/Risk Factors/Patient Goals on Admission    Weight Management Yes;Weight Loss    Intervention Weight Management: Develop a combined nutrition and exercise program designed to reach desired caloric intake, while maintaining appropriate intake of nutrient and fiber, sodium and fats, and  appropriate energy expenditure required for the weight goal.;Weight Management: Provide education and appropriate resources to help participant work on and attain dietary goals.;Weight Management/Obesity: Establish reasonable short term and long term weight goals.    Admit Weight 195 lb 8.8 oz (88.7 kg)    Expected Outcomes Short Term: Continue to assess and modify interventions until short term weight is achieved;Long Term: Adherence to nutrition and physical activity/exercise program aimed toward attainment of established weight goal;Weight Maintenance: Understanding of the daily nutrition guidelines, which includes 25-35% calories from fat, 7% or less cal from saturated fats, less than 232m cholesterol, less than 1.5gm of sodium, & 5 or more servings of fruits and vegetables daily;Weight Loss: Understanding of general recommendations for a balanced deficit meal plan, which promotes 1-2 lb weight loss per week and includes a negative energy balance of [phone] kcal/d;Understanding recommendations for meals to include 15-35% energy as protein, 25-35% energy from fat, 35-60% energy from carbohydrates, less than 2087mof dietary cholesterol, 20-35 gm of total fiber daily;Understanding of distribution of calorie intake throughout the day with the consumption of 4-5 meals/snacks    Hypertension Yes    Intervention Provide education on lifestyle modifcations including regular physical activity/exercise, weight management, moderate sodium restriction and increased consumption of fresh fruit, vegetables, and low fat dairy, alcohol moderation, and smoking cessation.;Monitor prescription use compliance.    Expected Outcomes Short Term: Continued assessment and intervention until BP is < 140/9031mG in hypertensive participants. < 130/67m69m in hypertensive participants with diabetes, heart failure or chronic kidney disease.;Long Term: Maintenance of blood pressure at goal levels.    Lipids Yes    Intervention  Provide education and support for participant on nutrition & aerobic/resistive exercise along with prescribed medications to achieve LDL <70mg22mL >40mg.33mExpected Outcomes Short Term: Participant states understanding of desired cholesterol values and is compliant with medications prescribed. Participant is following exercise prescription and nutrition guidelines.;Long Term: Cholesterol controlled with medications as prescribed, with individualized exercise RX and with personalized nutrition plan. Value goals: LDL < 70mg, 24m> 40 mg.  Core Components/Risk Factors/Patient Goals Review:    Core Components/Risk Factors/Patient Goals at Discharge (Final Review):    ITP Comments:  ITP Comments    Row Name 03/19/21 1150           ITP Comments Dr Fransico Him MD, Medical Director              Comments: Patient attended orientation on 03/19/2021 to review rules and guidelines for program.  Completed 6 minute nustep test as the patient says he cannot bear weight on his right foot for long periods of time. Mr Shaler says he is getting a prosthetic for his right shoe on Thursday. , Intitial ITP, and exercise prescription.  VSS. Telemetry-Sinus Rhythm.  Asymptomatic. Safety measures and social distancing in place per CDC guidelines.Barnet Pall, RN,BSN 03/19/2021 4:48 PM

## 2021-03-20 DIAGNOSIS — E1129 Type 2 diabetes mellitus with other diabetic kidney complication: Secondary | ICD-10-CM | POA: Diagnosis not present

## 2021-03-20 DIAGNOSIS — N186 End stage renal disease: Secondary | ICD-10-CM | POA: Diagnosis not present

## 2021-03-20 DIAGNOSIS — N2581 Secondary hyperparathyroidism of renal origin: Secondary | ICD-10-CM | POA: Diagnosis not present

## 2021-03-20 DIAGNOSIS — E782 Mixed hyperlipidemia: Secondary | ICD-10-CM | POA: Diagnosis not present

## 2021-03-20 DIAGNOSIS — Z992 Dependence on renal dialysis: Secondary | ICD-10-CM | POA: Diagnosis not present

## 2021-03-20 DIAGNOSIS — D631 Anemia in chronic kidney disease: Secondary | ICD-10-CM | POA: Diagnosis not present

## 2021-03-20 DIAGNOSIS — D509 Iron deficiency anemia, unspecified: Secondary | ICD-10-CM | POA: Diagnosis not present

## 2021-03-21 DIAGNOSIS — E11621 Type 2 diabetes mellitus with foot ulcer: Secondary | ICD-10-CM | POA: Diagnosis not present

## 2021-03-21 DIAGNOSIS — L97522 Non-pressure chronic ulcer of other part of left foot with fat layer exposed: Secondary | ICD-10-CM | POA: Diagnosis not present

## 2021-03-25 ENCOUNTER — Encounter (HOSPITAL_COMMUNITY)
Admission: RE | Admit: 2021-03-25 | Discharge: 2021-03-25 | Disposition: A | Discharge status: Home / Self Care | Payer: Medicare Other | Source: Ambulatory Visit | Attending: Cardiology | Admitting: Cardiology

## 2021-03-25 ENCOUNTER — Other Ambulatory Visit: Payer: Self-pay

## 2021-03-25 DIAGNOSIS — Z955 Presence of coronary angioplasty implant and graft: Secondary | ICD-10-CM | POA: Insufficient documentation

## 2021-03-25 DIAGNOSIS — I214 Non-ST elevation (NSTEMI) myocardial infarction: Secondary | ICD-10-CM | POA: Diagnosis not present

## 2021-03-25 LAB — GLUCOSE, CAPILLARY: Glucose-Capillary: 182 mg/dL — ABNORMAL HIGH (ref 70–99)

## 2021-03-25 NOTE — Progress Notes (Signed)
Daily Session Note  Patient Details  Name: SAMANTHA OLIVERA MRN: [id number] Date of Birth: [birth year] Referring Provider:   Flowsheet Row CARDIAC REHAB PHASE II ORIENTATION from 03/19/2021 in [facility]  Referring Provider Dr. Redmond Pulling, MD (Covering)      Encounter Date: 03/25/2021  Check In:  Session Check In - 03/25/21 1526      Check-In   Supervising physician immediately available to respond to emergencies [facility] immediately available    Physician(s) Dr Lonny Prude    Location [facility]    Staff Present Barnet Pall, RN, Milus Glazier, MS, ACSM-CEP, CCRP, Exercise Physiologist;Jetta Walker BS, ACSM EP-C, Exercise Physiologist;Other    Virtual Visit No    Medication changes reported     No    Fall or balance concerns reported    No    Tobacco Cessation No Change    Current number of cigarettes/nicotine per day     0    Warm-up and Cool-down Performed on first and last piece of equipment   Cardiac Rehab Orientation   Resistance Training Performed Yes    VAD Patient? No    PAD/SET Patient? No      Pain Assessment   Currently in Pain? No/denies    Pain Score 0-No pain    Multiple Pain Sites No           Capillary Blood Glucose: Results for orders placed or performed during the hospital encounter of 03/25/21 (from the past 24 hour(s))  Glucose, capillary     Status: Abnormal   Collection Time: 03/25/21  3:47 PM  Result Value Ref Range   Glucose-Capillary 182 (H) 70 - 99 mg/dL     Exercise Prescription Changes - 03/25/21 1630      Response to Exercise   Blood Pressure (Admit) 160/80    Blood Pressure (Exercise) 140/78    Blood Pressure (Exit) 128/78    Heart Rate (Admit) 87 bpm    Heart Rate (Exercise) 85 bpm    Heart Rate (Exit) 77 bpm    Rating of Perceived Exertion (Exercise) 9    Symptoms None    Comments Pt's first day in the CRP2 program    Duration Progress to 30 minutes of  aerobic  without signs/symptoms of physical distress    Intensity THRR unchanged      Progression   Progression Continue to progress workloads to maintain intensity without signs/symptoms of physical distress.    Average METs 2      Resistance Training   Training Prescription Yes    Weight 3 lbs    Reps 10-15    Time 10 Minutes      Interval Training   Interval Training No      Recumbant Bike   Level 2    RPM 55    Minutes 15    METs 1.9      NuStep   Level 2    SPM 80    Minutes 15    METs 2           Social History   Tobacco Use  Smoking Status Never Smoker  Smokeless Tobacco Never Used    Goals Met:  Exercise tolerated well No report of cardiac concerns or symptoms Strength training completed today  Goals Unmet:  Not Applicable  Comments: [place] started cardiac rehab today.  Pt tolerated light exercise without difficulty.Entry BP 160/80 exit BP 128/78, telemetry-Sinus Rhythm, asymptomatic.  Medication list reconciled.  Pt denies barriers to medicaiton compliance.  PSYCHOSOCIAL ASSESSMENT:  PHQ-0. Pt exhibits positive coping skills, hopeful outlook with supportive family. No psychosocial needs identified at this time, no psychosocial interventions necessary.    Pt enjoys cooking, home improvement, gardening travelling and going to the beach.   Pt oriented to exercise equipment and routine.    Understanding verbalized.Barnet Pall, RN,BSN 03/26/2021 7:54 AM   Dr. Fransico Him is Medical Director for Cardiac Rehab at [facility].

## 2021-03-26 ENCOUNTER — Encounter (HOSPITAL_COMMUNITY): Payer: Medicare Other

## 2021-03-26 DIAGNOSIS — L97509 Non-pressure chronic ulcer of other part of unspecified foot with unspecified severity: Secondary | ICD-10-CM | POA: Diagnosis not present

## 2021-03-26 DIAGNOSIS — I272 Pulmonary hypertension, unspecified: Secondary | ICD-10-CM | POA: Diagnosis not present

## 2021-03-26 DIAGNOSIS — E782 Mixed hyperlipidemia: Secondary | ICD-10-CM | POA: Diagnosis not present

## 2021-03-26 DIAGNOSIS — Z789 Other specified health status: Secondary | ICD-10-CM | POA: Insufficient documentation

## 2021-03-26 DIAGNOSIS — I071 Rheumatic tricuspid insufficiency: Secondary | ICD-10-CM | POA: Insufficient documentation

## 2021-03-26 DIAGNOSIS — E11621 Type 2 diabetes mellitus with foot ulcer: Secondary | ICD-10-CM | POA: Diagnosis not present

## 2021-03-26 DIAGNOSIS — I10 Essential (primary) hypertension: Secondary | ICD-10-CM | POA: Diagnosis not present

## 2021-03-26 DIAGNOSIS — I2510 Atherosclerotic heart disease of native coronary artery without angina pectoris: Secondary | ICD-10-CM | POA: Diagnosis not present

## 2021-03-26 DIAGNOSIS — N186 End stage renal disease: Secondary | ICD-10-CM | POA: Diagnosis not present

## 2021-03-26 DIAGNOSIS — Z992 Dependence on renal dialysis: Secondary | ICD-10-CM | POA: Diagnosis not present

## 2021-03-27 ENCOUNTER — Encounter (HOSPITAL_COMMUNITY)
Admission: RE | Admit: 2021-03-27 | Discharge: 2021-03-27 | Disposition: A | Discharge status: Home / Self Care | Payer: Medicare Other | Source: Ambulatory Visit | Attending: Cardiology | Admitting: Cardiology

## 2021-03-27 ENCOUNTER — Encounter (HOSPITAL_COMMUNITY): Payer: Medicare Other

## 2021-03-27 ENCOUNTER — Other Ambulatory Visit: Payer: Self-pay

## 2021-03-27 DIAGNOSIS — E782 Mixed hyperlipidemia: Secondary | ICD-10-CM | POA: Diagnosis not present

## 2021-03-27 DIAGNOSIS — Z955 Presence of coronary angioplasty implant and graft: Secondary | ICD-10-CM

## 2021-03-27 DIAGNOSIS — I214 Non-ST elevation (NSTEMI) myocardial infarction: Secondary | ICD-10-CM | POA: Diagnosis not present

## 2021-03-28 DIAGNOSIS — N186 End stage renal disease: Secondary | ICD-10-CM | POA: Diagnosis not present

## 2021-03-28 DIAGNOSIS — E1122 Type 2 diabetes mellitus with diabetic chronic kidney disease: Secondary | ICD-10-CM | POA: Diagnosis not present

## 2021-03-28 DIAGNOSIS — M19019 Primary osteoarthritis, unspecified shoulder: Secondary | ICD-10-CM | POA: Diagnosis not present

## 2021-03-28 DIAGNOSIS — I10 Essential (primary) hypertension: Secondary | ICD-10-CM | POA: Diagnosis not present

## 2021-03-28 DIAGNOSIS — Z1211 Encounter for screening for malignant neoplasm of colon: Secondary | ICD-10-CM | POA: Diagnosis not present

## 2021-03-28 DIAGNOSIS — I2510 Atherosclerotic heart disease of native coronary artery without angina pectoris: Secondary | ICD-10-CM | POA: Diagnosis not present

## 2021-03-29 ENCOUNTER — Encounter (HOSPITAL_COMMUNITY): Payer: Medicare Other

## 2021-04-01 ENCOUNTER — Encounter (HOSPITAL_COMMUNITY): Payer: Medicare Other

## 2021-04-01 ENCOUNTER — Encounter (HOSPITAL_COMMUNITY)
Admission: RE | Admit: 2021-04-01 | Discharge: 2021-04-01 | Disposition: A | Discharge status: Home / Self Care | Payer: Medicare Other | Source: Ambulatory Visit | Attending: Cardiology | Admitting: Cardiology

## 2021-04-01 ENCOUNTER — Other Ambulatory Visit: Payer: Self-pay

## 2021-04-01 DIAGNOSIS — I214 Non-ST elevation (NSTEMI) myocardial infarction: Secondary | ICD-10-CM

## 2021-04-01 DIAGNOSIS — Z955 Presence of coronary angioplasty implant and graft: Secondary | ICD-10-CM

## 2021-04-03 ENCOUNTER — Encounter (HOSPITAL_COMMUNITY): Payer: Medicare Other

## 2021-04-03 ENCOUNTER — Encounter (HOSPITAL_COMMUNITY)
Admission: RE | Admit: 2021-04-03 | Discharge: 2021-04-03 | Disposition: A | Discharge status: Home / Self Care | Payer: Medicare Other | Source: Ambulatory Visit | Attending: Cardiology | Admitting: Cardiology

## 2021-04-03 ENCOUNTER — Other Ambulatory Visit: Payer: Self-pay

## 2021-04-03 DIAGNOSIS — I214 Non-ST elevation (NSTEMI) myocardial infarction: Secondary | ICD-10-CM | POA: Diagnosis not present

## 2021-04-03 DIAGNOSIS — Z955 Presence of coronary angioplasty implant and graft: Secondary | ICD-10-CM

## 2021-04-04 NOTE — Progress Notes (Addendum)
[name] 55 y.o. male Nutrition Note  Diagnosis: NSTEMI, DES  Past Medical History:  Diagnosis Date   Abscess of right foot    Acute osteomyelitis of right foot ([place]) 05/15/2019   Anemia 06/23/2019   Cellulitis of right foot 05/22/2019   Chest pain 08/23/2013   Chills    at night - sometimes   Complication of anesthesia    Coronary artery disease    Diabetes mellitus    controlled by diet   Diabetes with renal manifestations(250.4) 08/23/2013   Diabetic foot ulcer- right second toe 05/14/2019   Diabetic infection of right foot ([place])    Diabetic retinopathy associated with type 2 diabetes mellitus ([place]) 06/23/2019   Eczema    ESRD on hemodialysis ([place]) 07/18/2011   Essential hypertension 10/18/2007   Qualifier: Diagnosis of  By: Loanne Drilling MD, Sean A    Fatigue    loss of fatigue   FOOT ULCER, LEFT 06/29/2008   Qualifier: Diagnosis of  By: Loanne Drilling MD, Sean A    Gangrene of toe of right foot ([place])    Headache(784.0)    Years ago   Heart murmur    years ago   History of blood transfusion    Hyperglycemia 05/05/2015   HYPERLIPIDEMIA 05/16/2008   Qualifier: Diagnosis of  By: Jenny Reichmann MD, Hunt Oris    Hypoglycemia, unspecified 11/13/2008   Qualifier: Diagnosis of  By: Jenny Reichmann MD, Hunt Oris    MRSA (methicillin resistant staph aureus) culture positive    MRSA INFECTION 10/18/2007   Qualifier: Diagnosis of  By: Marca Ancona RMA, Lucy     Myocardial infarction ([place])    Obesity, unspecified 08/23/2013   Osteomyelitis of great toe of right foot ([place]) 05/14/2019   Other forms of retinal detachment(361.89) 06/23/2019   PONV (postoperative nausea and vomiting)    Poor circulation    Secondary hyperparathyroidism ([place]) 06/23/2019   Sepsis ([place]) 05/14/2019   Type 2 diabetes mellitus ([place]) 08/23/2013   Ulcer of right foot, with necrosis of bone ([place])    Ulcer of right second toe with necrosis of bone ([place])      Medications reviewed.   Current Outpatient Medications:    amLODipine (NORVASC) 10 MG tablet, Take  10 mg by mouth at bedtime. , Disp: , Rfl:    aspirin EC 81 MG tablet, Take 1 tablet (81 mg total) by mouth daily., Disp: 90 tablet, Rfl: 3   blood glucose meter kit and supplies, Dispense based on patient and insurance preference. Use up to four times daily as directed. (FOR ICD-10 E10.9, E11.9)., Disp: 1 each, Rfl: 0   blood glucose meter kit and supplies, Dispense based on patient and insurance preference. Use up to four times daily as directed. (FOR ICD-9 250.00, 250.01)., Disp: 1 each, Rfl: 0   Continuous Blood Gluc Sensor (FREESTYLE LIBRE 14 DAY SENSOR) MISC, See admin instructions., Disp: , Rfl:    glucose blood (ACCU-CHEK AVIVA PLUS) test strip, Use as instructed, Disp: 100 each, Rfl: 1   hydrALAZINE (APRESOLINE) 50 MG tablet, Take 50 mg by mouth 3 (three) times daily., Disp: , Rfl:    isosorbide dinitrate (ISORDIL) 20 MG tablet, Take 20 mg by mouth 3 (three) times daily., Disp: , Rfl:    Lancet Devices (ACCU-CHEK SOFTCLIX) lancets, Use as instructed, Disp: 100 each, Rfl: 5   lidocaine-prilocaine (EMLA) cream, Apply 1 application topically as needed. (Patient taking differently: Apply 1 application topically as needed (port access).), Disp: 30 g, Rfl: 6   LOKELMA 10  g PACK packet, Take 10 g by mouth. Patient says he takes on HD days, Disp: , Rfl:    propranolol (INDERAL) 80 MG tablet, Take 1 tablet (80 mg total) by mouth 2 (two) times daily., Disp: 60 tablet, Rfl: 1   sevelamer carbonate (RENVELA) 800 MG tablet, Take 1,600 mg by mouth 3 (three) times daily with meals., Disp: , Rfl:    ticagrelor (BRILINTA) 90 MG TABS tablet, TAKE 1 TABLET (90 MG TOTAL) BY MOUTH TWO TIMES DAILY., Disp: 60 tablet, Rfl: 2   Ht Readings from Last 1 Encounters:  03/19/21 5' 9.5" (1.765 m)     Wt Readings from Last 3 Encounters:  03/19/21 195 lb 8.8 oz (88.7 kg)  02/11/21 190 lb (86.2 kg)  02/10/21 193 lb 2 oz (87.6 kg)     There is no height or weight on file to calculate BMI.   Social History    Tobacco Use  Smoking Status Never  Smokeless Tobacco Never     Lab Results  Component Value Date   CHOL 132 09/22/2020   Lab Results  Component Value Date   HDL 69 09/22/2020   Lab Results  Component Value Date   LDLCALC 48 09/22/2020   Lab Results  Component Value Date   TRIG 73 09/22/2020     Lab Results  Component Value Date   HGBA1C 7.4 (H) 09/22/2020     CBG (last 3)  No results for input(s): GLUCAP in the last 72 hours.   Nutrition Note  Spoke with pt. Nutrition Plan and Nutrition Survey goals reviewed with pt. Pt is following a Heart Healthy diet. Pt with ESRD and on HD. He has been seeing RD at dialysis for past 12 years. He is currently following a renal diet. He is limiting potassium and phosphorus d/t elevated labs. Trying to get adequate protein. He is reducing sodium/trying to avoid processed/high sodium foods. He limits salt shaker. Occasionally uses pink himalayan. Reviewed sodium content in salt. He states being on 2 L fluid restriction. Assessed fat intake. He does not eat red meat/pork. He eats out once per month. He tries to cook with mostly fresh foods.  Pt has Type 2 Diabetes. Last A1c indicates blood glucose well-controlled. Pt has Freestyle libre 2 CGM system. He monitors regularly throughout the day. He reports having lows when he skips meals. He is not on any diabetes medications.  Pt expressed understanding of the information reviewed.    Nutrition Diagnosis Food-and nutrition-related knowledge deficit related to lack of exposure to information as related to diagnosis of: ? CVD ? ESRD on HD  Nutrition Intervention Pt's individual nutrition plan reviewed with pt. Benefits of adopting Heart Healthy diet discussed when Picture Your Plate reviewed.   Pt given handouts for: ? Phosphorus content in foods, reduced sodium nutrition therapy, potassium content in foods Continue client-centered nutrition education by RD, as part of interdisciplinary  care.  Goal(s) Pt to build a healthy plate including vegetables, fruits, whole grains, and low-fat dairy products in a heart healthy meal plan. Pt to continue follow instructions from renal dietitian to modify phos and potassium intake  Plan:  Will provide client-centered nutrition education as part of interdisciplinary care Monitor and evaluate progress toward nutrition goal with team.   Michaele Offer, MS, RDN, LDN

## 2021-04-05 ENCOUNTER — Encounter (HOSPITAL_COMMUNITY): Payer: Medicare Other

## 2021-04-05 DIAGNOSIS — Z992 Dependence on renal dialysis: Secondary | ICD-10-CM | POA: Diagnosis not present

## 2021-04-05 DIAGNOSIS — N186 End stage renal disease: Secondary | ICD-10-CM | POA: Diagnosis not present

## 2021-04-05 DIAGNOSIS — N2581 Secondary hyperparathyroidism of renal origin: Secondary | ICD-10-CM | POA: Diagnosis not present

## 2021-04-07 DIAGNOSIS — E1165 Type 2 diabetes mellitus with hyperglycemia: Secondary | ICD-10-CM | POA: Diagnosis not present

## 2021-04-07 DIAGNOSIS — N2581 Secondary hyperparathyroidism of renal origin: Secondary | ICD-10-CM | POA: Diagnosis not present

## 2021-04-07 DIAGNOSIS — I200 Unstable angina: Secondary | ICD-10-CM | POA: Diagnosis not present

## 2021-04-07 DIAGNOSIS — R2241 Localized swelling, mass and lump, right lower limb: Secondary | ICD-10-CM | POA: Diagnosis not present

## 2021-04-07 DIAGNOSIS — L299 Pruritus, unspecified: Secondary | ICD-10-CM | POA: Diagnosis not present

## 2021-04-07 DIAGNOSIS — E875 Hyperkalemia: Secondary | ICD-10-CM | POA: Diagnosis not present

## 2021-04-07 DIAGNOSIS — R791 Abnormal coagulation profile: Secondary | ICD-10-CM | POA: Diagnosis not present

## 2021-04-07 DIAGNOSIS — R110 Nausea: Secondary | ICD-10-CM | POA: Diagnosis not present

## 2021-04-07 DIAGNOSIS — I5032 Chronic diastolic (congestive) heart failure: Secondary | ICD-10-CM | POA: Diagnosis not present

## 2021-04-07 DIAGNOSIS — E1122 Type 2 diabetes mellitus with diabetic chronic kidney disease: Secondary | ICD-10-CM | POA: Diagnosis not present

## 2021-04-07 DIAGNOSIS — I252 Old myocardial infarction: Secondary | ICD-10-CM | POA: Diagnosis not present

## 2021-04-07 DIAGNOSIS — R079 Chest pain, unspecified: Secondary | ICD-10-CM | POA: Diagnosis not present

## 2021-04-07 DIAGNOSIS — I2510 Atherosclerotic heart disease of native coronary artery without angina pectoris: Secondary | ICD-10-CM | POA: Diagnosis not present

## 2021-04-07 DIAGNOSIS — I130 Hypertensive heart and chronic kidney disease with heart failure and stage 1 through stage 4 chronic kidney disease, or unspecified chronic kidney disease: Secondary | ICD-10-CM | POA: Diagnosis not present

## 2021-04-07 DIAGNOSIS — I21A9 Other myocardial infarction type: Secondary | ICD-10-CM | POA: Diagnosis not present

## 2021-04-07 DIAGNOSIS — I160 Hypertensive urgency: Secondary | ICD-10-CM | POA: Diagnosis not present

## 2021-04-07 DIAGNOSIS — K4090 Unilateral inguinal hernia, without obstruction or gangrene, not specified as recurrent: Secondary | ICD-10-CM | POA: Diagnosis not present

## 2021-04-07 DIAGNOSIS — N186 End stage renal disease: Secondary | ICD-10-CM | POA: Diagnosis not present

## 2021-04-07 DIAGNOSIS — I132 Hypertensive heart and chronic kidney disease with heart failure and with stage 5 chronic kidney disease, or end stage renal disease: Secondary | ICD-10-CM | POA: Diagnosis not present

## 2021-04-07 DIAGNOSIS — Z7982 Long term (current) use of aspirin: Secondary | ICD-10-CM | POA: Diagnosis not present

## 2021-04-07 DIAGNOSIS — I214 Non-ST elevation (NSTEMI) myocardial infarction: Secondary | ICD-10-CM | POA: Diagnosis not present

## 2021-04-07 DIAGNOSIS — Z89431 Acquired absence of right foot: Secondary | ICD-10-CM | POA: Diagnosis not present

## 2021-04-07 DIAGNOSIS — Z955 Presence of coronary angioplasty implant and graft: Secondary | ICD-10-CM | POA: Diagnosis not present

## 2021-04-07 DIAGNOSIS — R0789 Other chest pain: Secondary | ICD-10-CM | POA: Diagnosis not present

## 2021-04-07 DIAGNOSIS — D631 Anemia in chronic kidney disease: Secondary | ICD-10-CM | POA: Diagnosis not present

## 2021-04-07 DIAGNOSIS — I120 Hypertensive chronic kidney disease with stage 5 chronic kidney disease or end stage renal disease: Secondary | ICD-10-CM | POA: Diagnosis not present

## 2021-04-07 DIAGNOSIS — T82855A Stenosis of coronary artery stent, initial encounter: Secondary | ICD-10-CM | POA: Diagnosis not present

## 2021-04-07 DIAGNOSIS — I361 Nonrheumatic tricuspid (valve) insufficiency: Secondary | ICD-10-CM | POA: Diagnosis not present

## 2021-04-07 DIAGNOSIS — J984 Other disorders of lung: Secondary | ICD-10-CM | POA: Diagnosis not present

## 2021-04-07 DIAGNOSIS — Z885 Allergy status to narcotic agent status: Secondary | ICD-10-CM | POA: Diagnosis not present

## 2021-04-07 DIAGNOSIS — I25110 Atherosclerotic heart disease of native coronary artery with unstable angina pectoris: Secondary | ICD-10-CM | POA: Diagnosis not present

## 2021-04-07 DIAGNOSIS — I340 Nonrheumatic mitral (valve) insufficiency: Secondary | ICD-10-CM | POA: Diagnosis not present

## 2021-04-07 DIAGNOSIS — E785 Hyperlipidemia, unspecified: Secondary | ICD-10-CM | POA: Diagnosis not present

## 2021-04-07 DIAGNOSIS — Z992 Dependence on renal dialysis: Secondary | ICD-10-CM | POA: Diagnosis not present

## 2021-04-07 DIAGNOSIS — E1151 Type 2 diabetes mellitus with diabetic peripheral angiopathy without gangrene: Secondary | ICD-10-CM | POA: Diagnosis not present

## 2021-04-08 ENCOUNTER — Encounter (HOSPITAL_COMMUNITY): Payer: Medicare Other

## 2021-04-10 ENCOUNTER — Encounter (HOSPITAL_COMMUNITY): Payer: Medicare Other

## 2021-04-10 ENCOUNTER — Telehealth (HOSPITAL_COMMUNITY): Payer: Self-pay | Admitting: *Deleted

## 2021-04-10 NOTE — Progress Notes (Signed)
Cardiac Individual Treatment Plan  Patient Details  Name: [name] MRN: [id number] Date of Birth: 03-10-1966 Referring Provider:   Flowsheet Row CARDIAC REHAB PHASE II ORIENTATION from 03/19/2021 in [place]  Referring Provider Dr. Redmond Pulling, MD (Covering)       Initial Encounter Date:  [place] from 03/19/2021 in [place]  Date 03/19/21       Visit Diagnosis:  09/21/20 NSTEMI    09/24/21 S/P DES OM2,  S/P PCI/ DES D2 at [facility]  Patient's Home Medications on Admission:  Current Outpatient Medications:    amLODipine (NORVASC) 10 MG tablet, Take 10 mg by mouth at bedtime. , Disp: , Rfl:    aspirin EC 81 MG tablet, Take 1 tablet (81 mg total) by mouth daily., Disp: 90 tablet, Rfl: 3   blood glucose meter kit and supplies, Dispense based on patient and insurance preference. Use up to four times daily as directed. (FOR ICD-10 E10.9, E11.9)., Disp: 1 each, Rfl: 0   blood glucose meter kit and supplies, Dispense based on patient and insurance preference. Use up to four times daily as directed. (FOR ICD-9 250.00, 250.01)., Disp: 1 each, Rfl: 0   Continuous Blood Gluc Sensor (FREESTYLE LIBRE 14 DAY SENSOR) MISC, See admin instructions., Disp: , Rfl:    glucose blood (ACCU-CHEK AVIVA PLUS) test strip, Use as instructed, Disp: 100 each, Rfl: 1   hydrALAZINE (APRESOLINE) 50 MG tablet, Take 50 mg by mouth 3 (three) times daily., Disp: , Rfl:    isosorbide dinitrate (ISORDIL) 20 MG tablet, Take 20 mg by mouth 3 (three) times daily., Disp: , Rfl:    Lancet Devices (ACCU-CHEK SOFTCLIX) lancets, Use as instructed, Disp: 100 each, Rfl: 5   lidocaine-prilocaine (EMLA) cream, Apply 1 application topically as needed. (Patient taking differently: Apply 1 application topically as needed (port access).), Disp: 30 g, Rfl: 6   LOKELMA 10 g PACK packet, Take 10 g by  mouth. Patient says he takes on HD days, Disp: , Rfl:    propranolol (INDERAL) 80 MG tablet, Take 1 tablet (80 mg total) by mouth 2 (two) times daily., Disp: 60 tablet, Rfl: 1   sevelamer carbonate (RENVELA) 800 MG tablet, Take 1,600 mg by mouth 3 (three) times daily with meals., Disp: , Rfl:    ticagrelor (BRILINTA) 90 MG TABS tablet, TAKE 1 TABLET (90 MG TOTAL) BY MOUTH TWO TIMES DAILY., Disp: 60 tablet, Rfl: 2  Past Medical History: Past Medical History:  Diagnosis Date   Abscess of right foot    Acute osteomyelitis of right foot ([place]) 05/15/2019   Anemia 06/23/2019   Cellulitis of right foot 05/22/2019   Chest pain 08/23/2013   Chills    at night - sometimes   Complication of anesthesia    Coronary artery disease    Diabetes mellitus    controlled by diet   Diabetes with renal manifestations(250.4) 08/23/2013   Diabetic foot ulcer- right second toe 05/14/2019   Diabetic infection of right foot ([place])    Diabetic retinopathy associated with type 2 diabetes mellitus ([place]) 06/23/2019   Eczema    ESRD on hemodialysis ([place]) 07/18/2011   Essential hypertension 10/18/2007   Qualifier: Diagnosis of  By: Loanne Drilling MD, Sean A    Fatigue    loss of fatigue   FOOT ULCER, LEFT 06/29/2008   Qualifier: Diagnosis of  By: Loanne Drilling MD, Jacelyn Pi  Gangrene of toe of right foot ([place])    Headache(784.0)    Years ago   Heart murmur    years ago   History of blood transfusion    Hyperglycemia 05/05/2015   HYPERLIPIDEMIA 05/16/2008   Qualifier: Diagnosis of  By: Jenny Reichmann MD, Hunt Oris    Hypoglycemia, unspecified 11/13/2008   Qualifier: Diagnosis of  By: Jenny Reichmann MD, Hunt Oris    MRSA (methicillin resistant staph aureus) culture positive    MRSA INFECTION 10/18/2007   Qualifier: Diagnosis of  By: Marca Ancona RMA, Lucy     Myocardial infarction ([place])    Obesity, unspecified 08/23/2013   Osteomyelitis of great toe of right foot ([place]) 05/14/2019   Other forms of retinal detachment(361.89) 06/23/2019   PONV (postoperative nausea  and vomiting)    Poor circulation    Secondary hyperparathyroidism ([place]) 06/23/2019   Sepsis ([place]) 05/14/2019   Type 2 diabetes mellitus ([place]) 08/23/2013   Ulcer of right foot, with necrosis of bone ([place])    Ulcer of right second toe with necrosis of bone ([place])     Tobacco Use: Social History   Tobacco Use  Smoking Status Never  Smokeless Tobacco Never    Labs: Recent Review Flowsheet Data     Labs for ITP Cardiac and Pulmonary Rehab Latest Ref Rng & Units 09/22/2020 12/27/2020 01/07/2021 01/08/2021 01/08/2021   Cholestrol 0 - 200 mg/dL 132 - - - -   LDLCALC 0 - 99 mg/dL 48 - - - -   LDLDIRECT mg/dL - - - - -   HDL >40 mg/dL 69 - - - -   Trlycerides <150 mg/dL 73 - - - -   Hemoglobin A1c 4.8 - 5.6 % 7.4(H) - - - -   PHART 7.350 - 7.450 - - - - -   PCO2ART 32.0 - 48.0 mmHg - - - - -   HCO3 20.0 - 28.0 mmol/L - - - - -   TCO2 22 - 32 mmol/L - _0 32   O2SAT % - - - - -       Capillary Blood Glucose: Lab Results  Component Value Date   GLUCAP 182 (H) 03/25/2021   GLUCAP 112 (H) 01/08/2021   GLUCAP 154 (H) 01/08/2021   GLUCAP 100 (H) 01/07/2021   GLUCAP 135 (H) 01/07/2021     Exercise Target Goals: Exercise Program Goal: Individual exercise prescription set using results from initial 6 min walk test and THRR while considering  patient's activity barriers and safety.   Exercise Prescription Goal: Starting with aerobic activity 30 plus minutes a day, 3 days per week for initial exercise prescription. Provide home exercise prescription and guidelines that participant acknowledges understanding prior to discharge.  Activity Barriers & Risk Stratification:  Activity Barriers & Cardiac Risk Stratification - 03/19/21 1227       Activity Barriers & Cardiac Risk Stratification   Activity Barriers Arthritis;Back Problems;Joint Problems;Deconditioning;Balance Concerns;History of Falls;Other (comment)    Comments Transmetatarasal amputation of the right foot. [place]ro[place] bilateral  shoulder problems/pain using arms above the head. Pt has ESRD on HD. Dizziness with change of position, especially after HD.    Cardiac Risk Stratification High             6 Minute Walk:  6 Minute Walk     Row Name 03/19/21 1246         6 Minute Walk   Phase Initial     Distance 1378 feet  performed on [place]  Walk Time 6 minutes     # of Rest Breaks 0     MPH 2.61     METS 3.63     RPE 7     Perceived Dyspnea  0     VO2 Peak 12.71     Symptoms No     Resting HR 71 bpm     Resting BP 140/80     Resting Oxygen Saturation  99 %     Exercise Oxygen Saturation  during 6 min walk 98 %     Max Ex. HR 75 bpm     Max Ex. BP 138/76     2 Minute Post BP 122/74              Oxygen Initial Assessment:   Oxygen Re-Evaluation:   Oxygen Discharge (Final Oxygen Re-Evaluation):   Initial Exercise Prescription:  Initial Exercise Prescription - 03/19/21 1300       Date of Initial Exercise RX and Referring Provider   Date 03/19/21    Referring Provider Dr. Redmond Pulling, MD (Covering)    Expected Discharge Date 05/17/21      Recumbant Bike   Level 2    RPM 60    Minutes 15    METs 2.2      NuStep   Level 3    SPM 75    Minutes 15    METs 2.2      Prescription Details   Frequency (times per week) 3    Duration Progress to 30 minutes of continuous aerobic without signs/symptoms of physical distress      Intensity   THRR 40-80% of Max Heartrate 66-132    Ratings of Perceived Exertion 11-13    Perceived Dyspnea 0-4      Progression   Progression Continue progressive overload as per policy without signs/symptoms or physical distress.      Resistance Training   Training Prescription Yes    Weight 3 lbs    Reps 10-15             Perform Capillary Blood Glucose checks as needed.  Exercise Prescription Changes:   Exercise Prescription Changes     Row Name 03/25/21 1630             Response to Exercise   Blood Pressure (Admit)  160/80       Blood Pressure (Exercise) 140/78       Blood Pressure (Exit) 128/78       Heart Rate (Admit) 87 bpm       Heart Rate (Exercise) 85 bpm       Heart Rate (Exit) 77 bpm       Rating of Perceived Exertion (Exercise) 9       Symptoms None       Comments Pt's first day in the CRP2 program       Duration Progress to 30 minutes of  aerobic without signs/symptoms of physical distress       Intensity THRR unchanged               Progression     Progression Continue to progress workloads to maintain intensity without signs/symptoms of physical distress.       Average METs 2               Resistance Training     Training Prescription Yes       Weight 3 lbs       Reps 10-15  Time 10 Minutes               Interval Training     Interval Training No               Recumbant Bike     Level 2       RPM 55       Minutes 15       METs 1.9               NuStep     Level 2       SPM 80       Minutes 15       METs 2               Exercise Comments:   Exercise Comments     Row Name 03/25/21 1625 04/10/21 1619         Exercise Comments Pt first day in the [facility] program. Pt toleratred session well. Pt has chronic shoulder issues that prevent him from using weights above the shouders. Unable to go over MET's and goals because pt has been absent               Exercise Goals and Review:   Exercise Goals     Row Name 03/19/21 1229             Exercise Goals   Increase Physical Activity Yes       Intervention Provide advice, education, support and counseling about physical activity/exercise needs.;Develop an individualized exercise prescription for aerobic and resistive training based on initial evaluation findings, risk stratification, comorbidities and participant's personal goals.       Expected Outcomes Short Term: Attend rehab on a regular basis to increase amount of physical activity.;Long Term: Add in home exercise to make exercise part of routine  and to increase amount of physical activity.;Long Term: Exercising regularly at least 3-5 days a week.       Increase Strength and Stamina Yes       Intervention Provide advice, education, support and counseling about physical activity/exercise needs.;Develop an individualized exercise prescription for aerobic and resistive training based on initial evaluation findings, risk stratification, comorbidities and participant's personal goals.       Expected Outcomes Short Term: Increase workloads from initial exercise prescription for resistance, speed, and METs.;Short Term: Perform resistance training exercises routinely during rehab and add in resistance training at home;Long Term: Improve cardiorespiratory fitness, muscular endurance and strength as measured by increased METs and functional capacity (6MWT)       Able to understand and use rate of perceived exertion (RPE) scale Yes       Intervention Provide education and explanation on how to use RPE scale       Expected Outcomes Short Term: Able to use RPE daily in rehab to express subjective intensity level;Long Term:  Able to use RPE to guide intensity level when exercising independently       Knowledge and understanding of Target Heart Rate Range (THRR) Yes       Intervention Provide education and explanation of THRR including how the numbers were predicted and where they are located for reference       Expected Outcomes Short Term: Able to state/look up THRR;Short Term: Able to use daily as guideline for intensity in rehab;Long Term: Able to use THRR to govern intensity when exercising independently       Understanding of Exercise Prescription Yes  Intervention Provide education, explanation, and written materials on patient's individual exercise prescription       Expected Outcomes Short Term: Able to explain program exercise prescription;Long Term: Able to explain home exercise prescription to exercise independently                 Exercise Goals Re-Evaluation :  Exercise Goals Re-Evaluation     Row Name 03/25/21 1625             Exercise Goal Re-Evaluation   Exercise Goals Review Increase Physical Activity;Increase Strength and Stamina;Able to understand and use rate of perceived exertion (RPE) scale;Knowledge and understanding of Target Heart Rate Range (THRR);Understanding of Exercise Prescription       Comments Pt's first day in the CRP2 program. Pt understnads the Exercise Rx, THRR, and RPE scale.       Expected Outcomes Will continue to monior patient and advance exercise workloads as tolerated.                 Discharge Exercise Prescription (Final Exercise Prescription Changes):  Exercise Prescription Changes - 03/25/21 1630       Response to Exercise   Blood Pressure (Admit) 160/80    Blood Pressure (Exercise) 140/78    Blood Pressure (Exit) 128/78    Heart Rate (Admit) 87 bpm    Heart Rate (Exercise) 85 bpm    Heart Rate (Exit) 77 bpm    Rating of Perceived Exertion (Exercise) 9    Symptoms None    Comments Pt's first day in the CRP2 program    Duration Progress to 30 minutes of  aerobic without signs/symptoms of physical distress    Intensity THRR unchanged      Progression   Progression Continue to progress workloads to maintain intensity without signs/symptoms of physical distress.    Average METs 2      Resistance Training   Training Prescription Yes    Weight 3 lbs    Reps 10-15    Time 10 Minutes      Interval Training   Interval Training No      Recumbant Bike   Level 2    RPM 55    Minutes 15    METs 1.9      NuStep   Level 2    SPM 80    Minutes 15    METs 2             Nutrition:  Target Goals: Understanding of nutrition guidelines, daily intake of sodium <1547m, cholesterol <2089m calories 30% from fat and 7% or less from saturated fats, daily to have 5 or more servings of fruits and vegetables.  Biometrics:  Pre Biometrics - 03/19/21 1023        Pre Biometrics   Waist Circumference 42 inches    Hip Circumference 40.5 inches    Waist to Hip Ratio 1.04 %    Triceps Skinfold 10 mm    % Body Fat 26.6 %    Grip Strength 36 kg    Flexibility 13.5 in    Single Leg Stand 2.32 seconds              Nutrition Therapy Plan and Nutrition Goals:  Nutrition Therapy & Goals - 04/04/21 1006       Nutrition Therapy   Diet TLC; reduced phos and potassium      Personal Nutrition Goals   Nutrition Goal Pt to build a healthy plate including vegetables, fruits, whole grains, and low-fat  dairy products in a heart healthy meal plan.    Personal Goal #2 Pt to continue follow instructions from renal dietitian to modify phos and potassium intake      Intervention Plan   Intervention Prescribe, educate and counsel regarding individualized specific dietary modifications aiming towards targeted core components such as weight, hypertension, lipid management, diabetes, heart failure and other comorbidities.;Nutrition handout(s) given to patient.    Expected Outcomes Short Term Goal: Understand basic principles of dietary content, such as calories, fat, sodium, cholesterol and nutrients.;Long Term Goal: Adherence to prescribed nutrition plan.             Nutrition Assessments:  MEDIFICTS Score Key: ?70 Need to make dietary changes  40-70 Heart Healthy Diet ? 40 Therapeutic Level Cholesterol Diet   Picture Your Plate Scores: <93 Unhealthy dietary pattern with much room for improvement. 41-50 Dietary pattern unlikely to meet recommendations for good health and room for improvement. 51-60 More healthful dietary pattern, with some room for improvement.  >60 Healthy dietary pattern, although there may be some specific behaviors that could be improved.    Nutrition Goals Re-Evaluation:  Nutrition Goals Re-Evaluation     Row Name 04/04/21 1007             Goals   Current Weight 195 lb (88.5 kg)       Nutrition Goal Pt to build  a healthy plate including vegetables, fruits, whole grains, and low-fat dairy products in a heart healthy meal plan.               Personal Goal #2 Re-Evaluation     Personal Goal #2 Pt to continue follow instructions from renal dietitian to modify phos and potassium intake               Nutrition Goals Discharge (Final Nutrition Goals Re-Evaluation):  Nutrition Goals Re-Evaluation - 04/04/21 1007       Goals   Current Weight 195 lb (88.5 kg)    Nutrition Goal Pt to build a healthy plate including vegetables, fruits, whole grains, and low-fat dairy products in a heart healthy meal plan.      Personal Goal #2 Re-Evaluation   Personal Goal #2 Pt to continue follow instructions from renal dietitian to modify phos and potassium intake             Psychosocial: Target Goals: Acknowledge presence or absence of significant depression and/or stress, maximize coping skills, provide positive support system. Participant is able to verbalize types and ability to use techniques and skills needed for reducing stress and depression.  Initial Review & Psychosocial Screening:  Initial Psych Review & Screening - 03/19/21 1151       Initial Review   Current issues with Current Stress Concerns    Source of Stress Concerns Chronic Illness;Unable to participate in former interests or hobbies;Unable to perform yard/household activities    Comments Roark receives HD on Monday, Wednesday and Fridays      [place]? Yes   Antwione has his wife for support     Barriers   Psychosocial barriers to participate in program The patient should benefit from training in stress management and relaxation.      Screening Interventions   Interventions Encouraged to exercise    Expected Outcomes Long Term Goal: Stressors or current issues are controlled or eliminated.             Quality of Life Scores:  Quality of Life -  03/19/21 1245       Quality of Life   Select  Quality of Life      Quality of Life Scores   Health/Function Pre 30 %    Socioeconomic Pre 30 %    Psych/Spiritual Pre 30 %    Family Pre 30 %    GLOBAL Pre 30 %            Scores of 19 and below usually indicate a poorer quality of life in these areas.  A difference of  2-3 points is a clinically meaningful difference.  A difference of 2-3 points in the total score of the Quality of Life Index has been associated with significant improvement in overall quality of life, self-image, physical symptoms, and general health in studies assessing change in quality of life.  PHQ-9: Recent Review Flowsheet Data     Depression screen [facility] 2/9 03/19/2021 05/10/2015   Decreased Interest 0 0   Down, Depressed, Hopeless 0 0   PHQ - 2 Score 0 0      Interpretation of Total Score  Total Score Depression Severity:  1-4 = Minimal depression, 5-9 = Mild depression, 10-14 = Moderate depression, 15-19 = Moderately severe depression, 20-27 = Severe depression   Psychosocial Evaluation and Intervention:   Psychosocial Re-Evaluation:  Psychosocial Re-Evaluation     [place] Name 04/10/21 1703             Psychosocial Re-Evaluation   Current issues with Current Stress Concerns       Comments Niki had a nonstemi the weekend of 04/07/21 at [facility]. Exercise is currently on hold       Continue Psychosocial Services  Follow up required by staff               Initial Review     Source of Stress Concerns Chronic Illness;Unable to perform yard/household activities;Unable to participate in former interests or hobbies               Psychosocial Discharge (Final Psychosocial Re-Evaluation):  Psychosocial Re-Evaluation - 04/10/21 1703       Psychosocial Re-Evaluation   Current issues with Current Stress Concerns    Comments Kyair had a nonstemi the weekend of 04/07/21 at [facility]. Exercise is currently on hold    Continue Psychosocial Services  Follow up required by staff       Initial Review   Source of Stress Concerns Chronic Illness;Unable to perform yard/household activities;Unable to participate in former interests or hobbies             Vocational Rehabilitation: Provide vocational rehab assistance to qualifying candidates.   Vocational Rehab Evaluation & Intervention:  Vocational Rehab - 03/19/21 1155       Initial Vocational Rehab Evaluation & Intervention   Assessment shows need for Vocational Rehabilitation No   Martise is on disabilty and does not need vocational rehab at this time            Education: Education Goals: Education classes will be provided on a weekly basis, covering required topics. Participant will state understanding/return demonstration of topics presented.  Learning Barriers/Preferences:  Learning Barriers/Preferences - 03/19/21 1231       Learning Barriers/Preferences   Learning Barriers Sight   wears glasses   Learning Preferences Audio;Verbal Instruction;Video;Written Material;Computer/Internet;Group Instruction;Individual Instruction;Pictoral;Skilled Demonstration             Education Topics: Hypertension, Hypertension Reduction -Define heart disease and high blood pressure. Discus how high blood pressure  affects the body and ways to reduce high blood pressure.   Exercise and Your Heart -Discuss why it is important to exercise, the FITT principles of exercise, normal and abnormal responses to exercise, and how to exercise safely.   Angina -Discuss definition of angina, causes of angina, treatment of angina, and how to decrease risk of having angina.   Cardiac Medications -Review what the following cardiac medications are used for, how they affect the body, and side effects that may occur when taking the medications.  Medications include Aspirin, Beta blockers, calcium channel blockers, ACE Inhibitors, angiotensin receptor blockers, diuretics, digoxin, and antihyperlipidemics.   Congestive Heart  Failure -Discuss the definition of CHF, how to live with CHF, the signs and symptoms of CHF, and how keep track of weight and sodium intake.   Heart Disease and Intimacy -Discus the effect sexual activity has on the heart, how changes occur during intimacy as we age, and safety during sexual activity.   Smoking Cessation / COPD -Discuss different methods to quit smoking, the health benefits of quitting smoking, and the definition of COPD.   Nutrition I: Fats -Discuss the types of cholesterol, what cholesterol does to the heart, and how cholesterol levels can be controlled.   Nutrition II: Labels -Discuss the different components of food labels and how to read food label   Heart Parts/Heart Disease and PAD -Discuss the anatomy of the heart, the pathway of blood circulation through the heart, and these are affected by heart disease.   Stress I: Signs and Symptoms -Discuss the causes of stress, how stress may lead to anxiety and depression, and ways to limit stress.   Stress II: Relaxation -Discuss different types of relaxation techniques to limit stress.   Warning Signs of Stroke / TIA -Discuss definition of a stroke, what the signs and symptoms are of a stroke, and how to identify when someone is having stroke.   Knowledge Questionnaire Score:  Knowledge Questionnaire Score - 03/19/21 1309       Knowledge Questionnaire Score   Pre Score 22/24             Core Components/Risk Factors/Patient Goals at Admission:  Personal Goals and Risk Factors at Admission - 03/19/21 1309       Core Components/Risk Factors/Patient Goals on Admission    Weight Management Yes;Weight Loss    Intervention Weight Management: Develop a combined nutrition and exercise program designed to reach desired caloric intake, while maintaining appropriate intake of nutrient and fiber, sodium and fats, and appropriate energy expenditure required for the weight goal.;Weight Management: Provide  education and appropriate resources to help participant work on and attain dietary goals.;Weight Management/Obesity: Establish reasonable short term and long term weight goals.    Admit Weight 195 lb 8.8 oz (88.7 kg)    Expected Outcomes Short Term: Continue to assess and modify interventions until short term weight is achieved;Long Term: Adherence to nutrition and physical activity/exercise program aimed toward attainment of established weight goal;Weight Maintenance: Understanding of the daily nutrition guidelines, which includes 25-35% calories from fat, 7% or less cal from saturated fats, less than $RemoveB'200mg'JAAyWrXj$  cholesterol, less than 1.5gm of sodium, & 5 or more servings of fruits and vegetables daily;Weight Loss: Understanding of general recommendations for a balanced deficit meal plan, which promotes 1-2 lb weight loss per week and includes a negative energy balance of [phone] kcal/d;Understanding recommendations for meals to include 15-35% energy as protein, 25-35% energy from fat, 35-60% energy from carbohydrates, less than $RemoveB'200mg'hgqpVIkn$  of dietary  cholesterol, 20-35 gm of total fiber daily;Understanding of distribution of calorie intake throughout the day with the consumption of 4-5 meals/snacks    Hypertension Yes    Intervention Provide education on lifestyle modifcations including regular physical activity/exercise, weight management, moderate sodium restriction and increased consumption of fresh fruit, vegetables, and low fat dairy, alcohol moderation, and smoking cessation.;Monitor prescription use compliance.    Expected Outcomes Short Term: Continued assessment and intervention until BP is < 140/71mm HG in hypertensive participants. < 130/65mm HG in hypertensive participants with diabetes, heart failure or chronic kidney disease.;Long Term: Maintenance of blood pressure at goal levels.    Lipids Yes    Intervention Provide education and support for participant on nutrition & aerobic/resistive exercise along  with prescribed medications to achieve LDL '70mg'$ , HDL >$Remo'40mg'BjcdV$ .    Expected Outcomes Short Term: Participant states understanding of desired cholesterol values and is compliant with medications prescribed. Participant is following exercise prescription and nutrition guidelines.;Long Term: Cholesterol controlled with medications as prescribed, with individualized exercise RX and with personalized nutrition plan. Value goals: LDL < $Rem'70mg'QkSU$ , HDL > 40 mg.             Core Components/Risk Factors/Patient Goals Review:    Core Components/Risk Factors/Patient Goals at Discharge (Final Review):    ITP Comments:  ITP Comments     Row Name 03/19/21 1150 04/10/21 1701         ITP Comments Dr Fransico Him MD, Medical Director 30 Day ITP Review. Micharl has been doing well with exercise at cardiac rehab last session on 04/03/21. Lola had a NSTEMI and stent placement while out of town at [facility]. Exercise is currently on hold.               Comments: See ITP comments.Barnet Pall, RN,BSN 04/10/2021 5:09 PM

## 2021-04-10 NOTE — Telephone Encounter (Signed)
Spoke with [name]. [name] Volkov said that he has a heart attack and stent placed while he was out of town at [facility]. Told [name] Ornellas that he will need clearance and to be seen by his cardiologist before he resumes exercise at cardiologist. Patient states understanding he is at home now.Barnet Pall, RN,BSN 04/10/2021 5:01 PM

## 2021-04-12 ENCOUNTER — Encounter (HOSPITAL_COMMUNITY): Payer: Medicare Other

## 2021-04-14 DIAGNOSIS — Z20822 Contact with and (suspected) exposure to covid-19: Secondary | ICD-10-CM | POA: Diagnosis not present

## 2021-04-15 ENCOUNTER — Encounter (HOSPITAL_COMMUNITY): Payer: Medicare Other

## 2021-04-16 ENCOUNTER — Telehealth (HOSPITAL_COMMUNITY): Payer: Self-pay | Admitting: *Deleted

## 2021-04-16 NOTE — Telephone Encounter (Signed)
Spoke with [employer]. [name] has a follow up appointment on 05/02/21 ost hospitalization/ MI in [place]. Told patient will need clearance from  the cardiologist for the patient to resume exercise. Will also get clearance from the dialysis center.Patient aware.Barnet Pall, RN,BSN 04/16/2021 3:04 PM

## 2021-04-16 NOTE — Telephone Encounter (Signed)
Spoke with the patient he is interested in returning to cardiac rehab but understands he cannot return until he follows up with the cardiologist post MI admission at [facility] on 04/07/21. The patient asked that I call on his behalf to obtain a follow up appointment with his cardiologist at [facility].Barnet Pall, RN,BSN 04/16/2021 12:18 PM

## 2021-04-17 ENCOUNTER — Encounter (HOSPITAL_COMMUNITY): Payer: Medicare Other

## 2021-04-19 ENCOUNTER — Encounter (HOSPITAL_COMMUNITY): Payer: Medicare Other

## 2021-04-19 DIAGNOSIS — D631 Anemia in chronic kidney disease: Secondary | ICD-10-CM | POA: Diagnosis not present

## 2021-04-19 DIAGNOSIS — N2581 Secondary hyperparathyroidism of renal origin: Secondary | ICD-10-CM | POA: Diagnosis not present

## 2021-04-19 DIAGNOSIS — E1129 Type 2 diabetes mellitus with other diabetic kidney complication: Secondary | ICD-10-CM | POA: Diagnosis not present

## 2021-04-19 DIAGNOSIS — N186 End stage renal disease: Secondary | ICD-10-CM | POA: Diagnosis not present

## 2021-04-19 DIAGNOSIS — Z992 Dependence on renal dialysis: Secondary | ICD-10-CM | POA: Diagnosis not present

## 2021-04-19 DIAGNOSIS — D509 Iron deficiency anemia, unspecified: Secondary | ICD-10-CM | POA: Diagnosis not present

## 2021-04-24 ENCOUNTER — Encounter (HOSPITAL_COMMUNITY): Payer: Medicare Other

## 2021-04-24 DIAGNOSIS — E1129 Type 2 diabetes mellitus with other diabetic kidney complication: Secondary | ICD-10-CM | POA: Diagnosis not present

## 2021-04-25 DIAGNOSIS — L97522 Non-pressure chronic ulcer of other part of left foot with fat layer exposed: Secondary | ICD-10-CM | POA: Diagnosis not present

## 2021-04-26 ENCOUNTER — Encounter (HOSPITAL_COMMUNITY): Payer: Medicare Other

## 2021-04-29 ENCOUNTER — Encounter (HOSPITAL_COMMUNITY): Payer: Medicare Other

## 2021-05-01 ENCOUNTER — Encounter (HOSPITAL_COMMUNITY): Payer: Medicare Other

## 2021-05-02 DIAGNOSIS — N186 End stage renal disease: Secondary | ICD-10-CM | POA: Diagnosis not present

## 2021-05-02 DIAGNOSIS — I10 Essential (primary) hypertension: Secondary | ICD-10-CM | POA: Diagnosis not present

## 2021-05-02 DIAGNOSIS — Z992 Dependence on renal dialysis: Secondary | ICD-10-CM | POA: Diagnosis not present

## 2021-05-02 DIAGNOSIS — I2510 Atherosclerotic heart disease of native coronary artery without angina pectoris: Secondary | ICD-10-CM | POA: Diagnosis not present

## 2021-05-02 DIAGNOSIS — E782 Mixed hyperlipidemia: Secondary | ICD-10-CM | POA: Diagnosis not present

## 2021-05-02 DIAGNOSIS — Z789 Other specified health status: Secondary | ICD-10-CM | POA: Diagnosis not present

## 2021-05-03 ENCOUNTER — Encounter (HOSPITAL_COMMUNITY): Payer: Medicare Other

## 2021-05-06 ENCOUNTER — Encounter (HOSPITAL_COMMUNITY): Payer: Medicare Other

## 2021-05-07 ENCOUNTER — Telehealth (HOSPITAL_COMMUNITY): Payer: Self-pay | Admitting: *Deleted

## 2021-05-07 NOTE — Telephone Encounter (Signed)
Spoke with Mr Molzahn he says he is still interested in returning to phase 2 cardiac rehab. Will contact Dr Loren Racer office at [facility] heart center about clearance for the patient to return to exercise.Barnet Pall, RN,BSN 05/07/2021 3:03 PM

## 2021-05-08 ENCOUNTER — Encounter (HOSPITAL_COMMUNITY): Payer: Medicare Other

## 2021-05-09 ENCOUNTER — Encounter (HOSPITAL_COMMUNITY): Payer: Self-pay | Admitting: *Deleted

## 2021-05-09 DIAGNOSIS — I214 Non-ST elevation (NSTEMI) myocardial infarction: Secondary | ICD-10-CM

## 2021-05-09 DIAGNOSIS — I2510 Atherosclerotic heart disease of native coronary artery without angina pectoris: Secondary | ICD-10-CM | POA: Diagnosis not present

## 2021-05-09 DIAGNOSIS — Z955 Presence of coronary angioplasty implant and graft: Secondary | ICD-10-CM

## 2021-05-09 DIAGNOSIS — E1122 Type 2 diabetes mellitus with diabetic chronic kidney disease: Secondary | ICD-10-CM | POA: Diagnosis not present

## 2021-05-09 DIAGNOSIS — L97522 Non-pressure chronic ulcer of other part of left foot with fat layer exposed: Secondary | ICD-10-CM | POA: Diagnosis not present

## 2021-05-09 DIAGNOSIS — N186 End stage renal disease: Secondary | ICD-10-CM | POA: Diagnosis not present

## 2021-05-09 DIAGNOSIS — G4700 Insomnia, unspecified: Secondary | ICD-10-CM | POA: Diagnosis not present

## 2021-05-09 DIAGNOSIS — I10 Essential (primary) hypertension: Secondary | ICD-10-CM | POA: Diagnosis not present

## 2021-05-09 NOTE — Progress Notes (Signed)
Cardiac Individual Treatment Plan  Patient Details  Name: [name] MRN: [id number] Date of Birth: 03-10-1966 Referring Provider:   Flowsheet Row CARDIAC REHAB PHASE II ORIENTATION from 03/19/2021 in [place]  Referring Provider Dr. Redmond Pulling, MD (Covering)       Initial Encounter Date:  [place] from 03/19/2021 in [place]  Date 03/19/21       Visit Diagnosis:  09/21/20 NSTEMI    09/24/21 S/P DES OM2,  S/P PCI/ DES D2 at [facility]  Patient's Home Medications on Admission:  Current Outpatient Medications:    amLODipine (NORVASC) 10 MG tablet, Take 10 mg by mouth at bedtime. , Disp: , Rfl:    aspirin EC 81 MG tablet, Take 1 tablet (81 mg total) by mouth daily., Disp: 90 tablet, Rfl: 3   blood glucose meter kit and supplies, Dispense based on patient and insurance preference. Use up to four times daily as directed. (FOR ICD-10 E10.9, E11.9)., Disp: 1 each, Rfl: 0   blood glucose meter kit and supplies, Dispense based on patient and insurance preference. Use up to four times daily as directed. (FOR ICD-9 250.00, 250.01)., Disp: 1 each, Rfl: 0   Continuous Blood Gluc Sensor (FREESTYLE LIBRE 14 DAY SENSOR) MISC, See admin instructions., Disp: , Rfl:    glucose blood (ACCU-CHEK AVIVA PLUS) test strip, Use as instructed, Disp: 100 each, Rfl: 1   hydrALAZINE (APRESOLINE) 50 MG tablet, Take 50 mg by mouth 3 (three) times daily., Disp: , Rfl:    isosorbide dinitrate (ISORDIL) 20 MG tablet, Take 20 mg by mouth 3 (three) times daily., Disp: , Rfl:    Lancet Devices (ACCU-CHEK SOFTCLIX) lancets, Use as instructed, Disp: 100 each, Rfl: 5   lidocaine-prilocaine (EMLA) cream, Apply 1 application topically as needed. (Patient taking differently: Apply 1 application topically as needed (port access).), Disp: 30 g, Rfl: 6   LOKELMA 10 g PACK packet, Take 10 g by  mouth. Patient says he takes on HD days, Disp: , Rfl:    propranolol (INDERAL) 80 MG tablet, Take 1 tablet (80 mg total) by mouth 2 (two) times daily., Disp: 60 tablet, Rfl: 1   sevelamer carbonate (RENVELA) 800 MG tablet, Take 1,600 mg by mouth 3 (three) times daily with meals., Disp: , Rfl:    ticagrelor (BRILINTA) 90 MG TABS tablet, TAKE 1 TABLET (90 MG TOTAL) BY MOUTH TWO TIMES DAILY., Disp: 60 tablet, Rfl: 2  Past Medical History: Past Medical History:  Diagnosis Date   Abscess of right foot    Acute osteomyelitis of right foot ([place]) 05/15/2019   Anemia 06/23/2019   Cellulitis of right foot 05/22/2019   Chest pain 08/23/2013   Chills    at night - sometimes   Complication of anesthesia    Coronary artery disease    Diabetes mellitus    controlled by diet   Diabetes with renal manifestations(250.4) 08/23/2013   Diabetic foot ulcer- right second toe 05/14/2019   Diabetic infection of right foot ([place])    Diabetic retinopathy associated with type 2 diabetes mellitus ([place]) 06/23/2019   Eczema    ESRD on hemodialysis ([place]) 07/18/2011   Essential hypertension 10/18/2007   Qualifier: Diagnosis of  By: Loanne Drilling MD, Sean A    Fatigue    loss of fatigue   FOOT ULCER, LEFT 06/29/2008   Qualifier: Diagnosis of  By: Loanne Drilling MD, Jacelyn Pi  Gangrene of toe of right foot ([place])    Headache(784.0)    Years ago   Heart murmur    years ago   History of blood transfusion    Hyperglycemia 05/05/2015   HYPERLIPIDEMIA 05/16/2008   Qualifier: Diagnosis of  By: Jenny Reichmann MD, Hunt Oris    Hypoglycemia, unspecified 11/13/2008   Qualifier: Diagnosis of  By: Jenny Reichmann MD, Hunt Oris    MRSA (methicillin resistant staph aureus) culture positive    MRSA INFECTION 10/18/2007   Qualifier: Diagnosis of  By: Marca Ancona RMA, Lucy     Myocardial infarction ([place])    Obesity, unspecified 08/23/2013   Osteomyelitis of great toe of right foot ([place]) 05/14/2019   Other forms of retinal detachment(361.89) 06/23/2019   PONV (postoperative nausea  and vomiting)    Poor circulation    Secondary hyperparathyroidism ([place]) 06/23/2019   Sepsis ([place]) 05/14/2019   Type 2 diabetes mellitus ([place]) 08/23/2013   Ulcer of right foot, with necrosis of bone ([place])    Ulcer of right second toe with necrosis of bone ([place])     Tobacco Use: Social History   Tobacco Use  Smoking Status Never  Smokeless Tobacco Never    Labs: Recent Review Flowsheet Data     Labs for ITP Cardiac and Pulmonary Rehab Latest Ref Rng & Units 09/22/2020 12/27/2020 01/07/2021 01/08/2021 01/08/2021   Cholestrol 0 - 200 mg/dL 132 - - - -   LDLCALC 0 - 99 mg/dL 48 - - - -   LDLDIRECT mg/dL - - - - -   HDL >40 mg/dL 69 - - - -   Trlycerides <150 mg/dL 73 - - - -   Hemoglobin A1c 4.8 - 5.6 % 7.4(H) - - - -   PHART 7.350 - 7.450 - - - - -   PCO2ART 32.0 - 48.0 mmHg - - - - -   HCO3 20.0 - 28.0 mmol/L - - - - -   TCO2 22 - 32 mmol/L - _0 32   O2SAT % - - - - -       Capillary Blood Glucose: Lab Results  Component Value Date   GLUCAP 182 (H) 03/25/2021   GLUCAP 112 (H) 01/08/2021   GLUCAP 154 (H) 01/08/2021   GLUCAP 100 (H) 01/07/2021   GLUCAP 135 (H) 01/07/2021     Exercise Target Goals: Exercise Program Goal: Individual exercise prescription set using results from initial 6 min walk test and THRR while considering  patient's activity barriers and safety.   Exercise Prescription Goal: Starting with aerobic activity 30 plus minutes a day, 3 days per week for initial exercise prescription. Provide home exercise prescription and guidelines that participant acknowledges understanding prior to discharge.  Activity Barriers & Risk Stratification:  Activity Barriers & Cardiac Risk Stratification - 03/19/21 1227       Activity Barriers & Cardiac Risk Stratification   Activity Barriers Arthritis;Back Problems;Joint Problems;Deconditioning;Balance Concerns;History of Falls;Other (comment)    Comments Transmetatarasal amputation of the right foot. [place]ro[place] bilateral  shoulder problems/pain using arms above the head. Pt has ESRD on HD. Dizziness with change of position, especially after HD.    Cardiac Risk Stratification High             6 Minute Walk:  6 Minute Walk     Row Name 03/19/21 1246         6 Minute Walk   Phase Initial     Distance 1378 feet  performed on [place]  Walk Time 6 minutes     # of Rest Breaks 0     MPH 2.61     METS 3.63     RPE 7     Perceived Dyspnea  0     VO2 Peak 12.71     Symptoms No     Resting HR 71 bpm     Resting BP 140/80     Resting Oxygen Saturation  99 %     Exercise Oxygen Saturation  during 6 min walk 98 %     Max Ex. HR 75 bpm     Max Ex. BP 138/76     2 Minute Post BP 122/74              Oxygen Initial Assessment:   Oxygen Re-Evaluation:   Oxygen Discharge (Final Oxygen Re-Evaluation):   Initial Exercise Prescription:  Initial Exercise Prescription - 03/19/21 1300       Date of Initial Exercise RX and Referring Provider   Date 03/19/21    Referring Provider Dr. Redmond Pulling, MD (Covering)    Expected Discharge Date 05/17/21      Recumbant Bike   Level 2    RPM 60    Minutes 15    METs 2.2      NuStep   Level 3    SPM 75    Minutes 15    METs 2.2      Prescription Details   Frequency (times per week) 3    Duration Progress to 30 minutes of continuous aerobic without signs/symptoms of physical distress      Intensity   THRR 40-80% of Max Heartrate 66-132    Ratings of Perceived Exertion 11-13    Perceived Dyspnea 0-4      Progression   Progression Continue progressive overload as per policy without signs/symptoms or physical distress.      Resistance Training   Training Prescription Yes    Weight 3 lbs    Reps 10-15             Perform Capillary Blood Glucose checks as needed.  Exercise Prescription Changes:   Exercise Prescription Changes     Row Name 03/25/21 1630             Response to Exercise   Blood Pressure (Admit)  160/80       Blood Pressure (Exercise) 140/78       Blood Pressure (Exit) 128/78       Heart Rate (Admit) 87 bpm       Heart Rate (Exercise) 85 bpm       Heart Rate (Exit) 77 bpm       Rating of Perceived Exertion (Exercise) 9       Symptoms None       Comments Pt's first day in the CRP2 program       Duration Progress to 30 minutes of  aerobic without signs/symptoms of physical distress       Intensity THRR unchanged               Progression     Progression Continue to progress workloads to maintain intensity without signs/symptoms of physical distress.       Average METs 2               Resistance Training     Training Prescription Yes       Weight 3 lbs       Reps 10-15  Time 10 Minutes               Interval Training     Interval Training No               Recumbant Bike     Level 2       RPM 55       Minutes 15       METs 1.9               NuStep     Level 2       SPM 80       Minutes 15       METs 2               Exercise Comments:   Exercise Comments     Row Name 03/25/21 1625 04/10/21 1619         Exercise Comments Pt first day in the [facility] program. Pt toleratred session well. Pt has chronic shoulder issues that prevent him from using weights above the shouders. Unable to go over MET's and goals because pt has been absent               Exercise Goals and Review:   Exercise Goals     Row Name 03/19/21 1229             Exercise Goals   Increase Physical Activity Yes       Intervention Provide advice, education, support and counseling about physical activity/exercise needs.;Develop an individualized exercise prescription for aerobic and resistive training based on initial evaluation findings, risk stratification, comorbidities and participant's personal goals.       Expected Outcomes Short Term: Attend rehab on a regular basis to increase amount of physical activity.;Long Term: Add in home exercise to make exercise part of routine  and to increase amount of physical activity.;Long Term: Exercising regularly at least 3-5 days a week.       Increase Strength and Stamina Yes       Intervention Provide advice, education, support and counseling about physical activity/exercise needs.;Develop an individualized exercise prescription for aerobic and resistive training based on initial evaluation findings, risk stratification, comorbidities and participant's personal goals.       Expected Outcomes Short Term: Increase workloads from initial exercise prescription for resistance, speed, and METs.;Short Term: Perform resistance training exercises routinely during rehab and add in resistance training at home;Long Term: Improve cardiorespiratory fitness, muscular endurance and strength as measured by increased METs and functional capacity (6MWT)       Able to understand and use rate of perceived exertion (RPE) scale Yes       Intervention Provide education and explanation on how to use RPE scale       Expected Outcomes Short Term: Able to use RPE daily in rehab to express subjective intensity level;Long Term:  Able to use RPE to guide intensity level when exercising independently       Knowledge and understanding of Target Heart Rate Range (THRR) Yes       Intervention Provide education and explanation of THRR including how the numbers were predicted and where they are located for reference       Expected Outcomes Short Term: Able to state/look up THRR;Short Term: Able to use daily as guideline for intensity in rehab;Long Term: Able to use THRR to govern intensity when exercising independently       Understanding of Exercise Prescription Yes  Intervention Provide education, explanation, and written materials on patient's individual exercise prescription       Expected Outcomes Short Term: Able to explain program exercise prescription;Long Term: Able to explain home exercise prescription to exercise independently                 Exercise Goals Re-Evaluation :  Exercise Goals Re-Evaluation     Row Name 03/25/21 1625             Exercise Goal Re-Evaluation   Exercise Goals Review Increase Physical Activity;Increase Strength and Stamina;Able to understand and use rate of perceived exertion (RPE) scale;Knowledge and understanding of Target Heart Rate Range (THRR);Understanding of Exercise Prescription       Comments Pt's first day in the CRP2 program. Pt understnads the Exercise Rx, THRR, and RPE scale.       Expected Outcomes Will continue to monior patient and advance exercise workloads as tolerated.                 Discharge Exercise Prescription (Final Exercise Prescription Changes):  Exercise Prescription Changes - 03/25/21 1630       Response to Exercise   Blood Pressure (Admit) 160/80    Blood Pressure (Exercise) 140/78    Blood Pressure (Exit) 128/78    Heart Rate (Admit) 87 bpm    Heart Rate (Exercise) 85 bpm    Heart Rate (Exit) 77 bpm    Rating of Perceived Exertion (Exercise) 9    Symptoms None    Comments Pt's first day in the CRP2 program    Duration Progress to 30 minutes of  aerobic without signs/symptoms of physical distress    Intensity THRR unchanged      Progression   Progression Continue to progress workloads to maintain intensity without signs/symptoms of physical distress.    Average METs 2      Resistance Training   Training Prescription Yes    Weight 3 lbs    Reps 10-15    Time 10 Minutes      Interval Training   Interval Training No      Recumbant Bike   Level 2    RPM 55    Minutes 15    METs 1.9      NuStep   Level 2    SPM 80    Minutes 15    METs 2             Nutrition:  Target Goals: Understanding of nutrition guidelines, daily intake of sodium <1547m, cholesterol <2089m calories 30% from fat and 7% or less from saturated fats, daily to have 5 or more servings of fruits and vegetables.  Biometrics:  Pre Biometrics - 03/19/21 1023        Pre Biometrics   Waist Circumference 42 inches    Hip Circumference 40.5 inches    Waist to Hip Ratio 1.04 %    Triceps Skinfold 10 mm    % Body Fat 26.6 %    Grip Strength 36 kg    Flexibility 13.5 in    Single Leg Stand 2.32 seconds              Nutrition Therapy Plan and Nutrition Goals:  Nutrition Therapy & Goals - 04/04/21 1006       Nutrition Therapy   Diet TLC; reduced phos and potassium      Personal Nutrition Goals   Nutrition Goal Pt to build a healthy plate including vegetables, fruits, whole grains, and low-fat  dairy products in a heart healthy meal plan.    Personal Goal #2 Pt to continue follow instructions from renal dietitian to modify phos and potassium intake      Intervention Plan   Intervention Prescribe, educate and counsel regarding individualized specific dietary modifications aiming towards targeted core components such as weight, hypertension, lipid management, diabetes, heart failure and other comorbidities.;Nutrition handout(s) given to patient.    Expected Outcomes Short Term Goal: Understand basic principles of dietary content, such as calories, fat, sodium, cholesterol and nutrients.;Long Term Goal: Adherence to prescribed nutrition plan.             Nutrition Assessments:  MEDIFICTS Score Key: ?70 Need to make dietary changes  40-70 Heart Healthy Diet ? 40 Therapeutic Level Cholesterol Diet   Picture Your Plate Scores: <93 Unhealthy dietary pattern with much room for improvement. 41-50 Dietary pattern unlikely to meet recommendations for good health and room for improvement. 51-60 More healthful dietary pattern, with some room for improvement.  >60 Healthy dietary pattern, although there may be some specific behaviors that could be improved.    Nutrition Goals Re-Evaluation:  Nutrition Goals Re-Evaluation     Row Name 04/04/21 1007             Goals   Current Weight 195 lb (88.5 kg)       Nutrition Goal Pt to build  a healthy plate including vegetables, fruits, whole grains, and low-fat dairy products in a heart healthy meal plan.               Personal Goal #2 Re-Evaluation     Personal Goal #2 Pt to continue follow instructions from renal dietitian to modify phos and potassium intake               Nutrition Goals Discharge (Final Nutrition Goals Re-Evaluation):  Nutrition Goals Re-Evaluation - 04/04/21 1007       Goals   Current Weight 195 lb (88.5 kg)    Nutrition Goal Pt to build a healthy plate including vegetables, fruits, whole grains, and low-fat dairy products in a heart healthy meal plan.      Personal Goal #2 Re-Evaluation   Personal Goal #2 Pt to continue follow instructions from renal dietitian to modify phos and potassium intake             Psychosocial: Target Goals: Acknowledge presence or absence of significant depression and/or stress, maximize coping skills, provide positive support system. Participant is able to verbalize types and ability to use techniques and skills needed for reducing stress and depression.  Initial Review & Psychosocial Screening:  Initial Psych Review & Screening - 03/19/21 1151       Initial Review   Current issues with Current Stress Concerns    Source of Stress Concerns Chronic Illness;Unable to participate in former interests or hobbies;Unable to perform yard/household activities    Comments Roark receives HD on Monday, Wednesday and Fridays      [place]? Yes   Antwione has his wife for support     Barriers   Psychosocial barriers to participate in program The patient should benefit from training in stress management and relaxation.      Screening Interventions   Interventions Encouraged to exercise    Expected Outcomes Long Term Goal: Stressors or current issues are controlled or eliminated.             Quality of Life Scores:  Quality of Life -  03/19/21 1245       Quality of Life   Select  Quality of Life      Quality of Life Scores   Health/Function Pre 30 %    Socioeconomic Pre 30 %    Psych/Spiritual Pre 30 %    Family Pre 30 %    GLOBAL Pre 30 %            Scores of 19 and below usually indicate a poorer quality of life in these areas.  A difference of  2-3 points is a clinically meaningful difference.  A difference of 2-3 points in the total score of the Quality of Life Index has been associated with significant improvement in overall quality of life, self-image, physical symptoms, and general health in studies assessing change in quality of life.  PHQ-9: Recent Review Flowsheet Data     Depression screen [facility] 2/9 03/19/2021 05/10/2015   Decreased Interest 0 0   Down, Depressed, Hopeless 0 0   PHQ - 2 Score 0 0      Interpretation of Total Score  Total Score Depression Severity:  1-4 = Minimal depression, 5-9 = Mild depression, 10-14 = Moderate depression, 15-19 = Moderately severe depression, 20-27 = Severe depression   Psychosocial Evaluation and Intervention:   Psychosocial Re-Evaluation:  Psychosocial Re-Evaluation     [place] Name 04/10/21 1703 05/09/21 1040           Psychosocial Re-Evaluation   Current issues with Current Stress Concerns Current Stress Concerns      Comments Latif had a nonstemi the weekend of 04/07/21 at [facility]. Exercise is currently on hold Romelo had a nonstemi the weekend of 04/07/21 at [facility]. Awaiting clearance from [facility] for Gilles to return to exercise at cardiac rehab      Continue Psychosocial Services  Follow up required by staff Follow up required by staff             Initial Review      Source of Stress Concerns Chronic Illness;Unable to perform yard/household activities;Unable to participate in former interests or hobbies Chronic Illness;Unable to perform yard/household activities;Unable to participate in former interests or hobbies      Comments -- Unable to reassess as exercise is currently on hold               Psychosocial Discharge (Final Psychosocial Re-Evaluation):  Psychosocial Re-Evaluation - 05/09/21 1040       Psychosocial Re-Evaluation   Current issues with Current Stress Concerns    Comments Tyjon had a nonstemi the weekend of 04/07/21 at [facility]. Awaiting clearance from [facility] for Netanel to return to exercise at cardiac rehab    Continue Psychosocial Services  Follow up required by staff      Initial Review   Source of Stress Concerns Chronic Illness;Unable to perform yard/household activities;Unable to participate in former interests or hobbies    Comments Unable to reassess as exercise is currently on hold             Vocational Rehabilitation: Provide vocational rehab assistance to qualifying candidates.   Vocational Rehab Evaluation & Intervention:  Vocational Rehab - 03/19/21 1155       Initial Vocational Rehab Evaluation & Intervention   Assessment shows need for Vocational Rehabilitation No   Laymon is on disabilty and does not need vocational rehab at this time            Education: Education Goals: Education classes will be provided on a  weekly basis, covering required topics. Participant will state understanding/return demonstration of topics presented.  Learning Barriers/Preferences:  Learning Barriers/Preferences - 03/19/21 1231       Learning Barriers/Preferences   Learning Barriers Sight   wears glasses   Learning Preferences Audio;Verbal Instruction;Video;Written Material;Computer/Internet;Group Instruction;Individual Instruction;Pictoral;Skilled Demonstration             Education Topics: Hypertension, Hypertension Reduction -Define heart disease and high blood pressure. Discus how high blood pressure affects the body and ways to reduce high blood pressure.   Exercise and Your Heart -Discuss why it is important to exercise, the FITT principles of exercise, normal and abnormal responses to exercise, and how to exercise  safely.   Angina -Discuss definition of angina, causes of angina, treatment of angina, and how to decrease risk of having angina.   Cardiac Medications -Review what the following cardiac medications are used for, how they affect the body, and side effects that may occur when taking the medications.  Medications include Aspirin, Beta blockers, calcium channel blockers, ACE Inhibitors, angiotensin receptor blockers, diuretics, digoxin, and antihyperlipidemics.   Congestive Heart Failure -Discuss the definition of CHF, how to live with CHF, the signs and symptoms of CHF, and how keep track of weight and sodium intake.   Heart Disease and Intimacy -Discus the effect sexual activity has on the heart, how changes occur during intimacy as we age, and safety during sexual activity.   Smoking Cessation / COPD -Discuss different methods to quit smoking, the health benefits of quitting smoking, and the definition of COPD.   Nutrition I: Fats -Discuss the types of cholesterol, what cholesterol does to the heart, and how cholesterol levels can be controlled.   Nutrition II: Labels -Discuss the different components of food labels and how to read food label   Heart Parts/Heart Disease and PAD -Discuss the anatomy of the heart, the pathway of blood circulation through the heart, and these are affected by heart disease.   Stress I: Signs and Symptoms -Discuss the causes of stress, how stress may lead to anxiety and depression, and ways to limit stress.   Stress II: Relaxation -Discuss different types of relaxation techniques to limit stress.   Warning Signs of Stroke / TIA -Discuss definition of a stroke, what the signs and symptoms are of a stroke, and how to identify when someone is having stroke.   Knowledge Questionnaire Score:  Knowledge Questionnaire Score - 03/19/21 1309       Knowledge Questionnaire Score   Pre Score 22/24             Core Components/Risk  Factors/Patient Goals at Admission:  Personal Goals and Risk Factors at Admission - 03/19/21 1309       Core Components/Risk Factors/Patient Goals on Admission    Weight Management Yes;Weight Loss    Intervention Weight Management: Develop a combined nutrition and exercise program designed to reach desired caloric intake, while maintaining appropriate intake of nutrient and fiber, sodium and fats, and appropriate energy expenditure required for the weight goal.;Weight Management: Provide education and appropriate resources to help participant work on and attain dietary goals.;Weight Management/Obesity: Establish reasonable short term and long term weight goals.    Admit Weight 195 lb 8.8 oz (88.7 kg)    Expected Outcomes Short Term: Continue to assess and modify interventions until short term weight is achieved;Long Term: Adherence to nutrition and physical activity/exercise program aimed toward attainment of established weight goal;Weight Maintenance: Understanding of the daily nutrition guidelines, which includes 25-35% calories  from fat, 7% or less cal from saturated fats, less than 250m cholesterol, less than 1.5gm of sodium, & 5 or more servings of fruits and vegetables daily;Weight Loss: Understanding of general recommendations for a balanced deficit meal plan, which promotes 1-2 lb weight loss per week and includes a negative energy balance of [phone] kcal/d;Understanding recommendations for meals to include 15-35% energy as protein, 25-35% energy from fat, 35-60% energy from carbohydrates, less than 2052mof dietary cholesterol, 20-35 gm of total fiber daily;Understanding of distribution of calorie intake throughout the day with the consumption of 4-5 meals/snacks    Hypertension Yes    Intervention Provide education on lifestyle modifcations including regular physical activity/exercise, weight management, moderate sodium restriction and increased consumption of fresh fruit, vegetables, and low  fat dairy, alcohol moderation, and smoking cessation.;Monitor prescription use compliance.    Expected Outcomes Short Term: Continued assessment and intervention until BP is < 140/9031mG in hypertensive participants. < 130/26m28m in hypertensive participants with diabetes, heart failure or chronic kidney disease.;Long Term: Maintenance of blood pressure at goal levels.    Lipids Yes    Intervention Provide education and support for participant on nutrition & aerobic/resistive exercise along with prescribed medications to achieve LDL <70mg64mL >40mg.55mExpected Outcomes Short Term: Participant states understanding of desired cholesterol values and is compliant with medications prescribed. Participant is following exercise prescription and nutrition guidelines.;Long Term: Cholesterol controlled with medications as prescribed, with individualized exercise RX and with personalized nutrition plan. Value goals: LDL < 70mg, 59m> 40 mg.             Core Components/Risk Factors/Patient Goals Review:   Goals and Risk Factor Review     Row Name 04/10/21 1705 05/09/21 1042           Core Components/Risk Factors/Patient Goals Review   Personal Goals Review Weight Management/Obesity;Stress;Hypertension;Lipids Weight Management/Obesity;Stress;Hypertension;Lipids      Review Aden hTamelen doing well with exercise. Vital signs have been stable. Patient had a NSTEMI/ Stent placement, [facility]      Expected Outcomes Exercise is currently on hold will reassess outcomes once patient is cleared to return to exercise. Exercise is currently on hold will reassess outcomes once patient is cleared to return to exercise.               Core Components/Risk Factors/Patient Goals at Discharge (Final Review):   Goals and Risk Factor Review - 05/09/21 1042       Core Components/Risk Factors/Patient Goals  Review   Personal Goals Review Weight Management/Obesity;Stress;Hypertension;Lipids    Review [facility]    Expected Outcomes Exercise is currently on hold will reassess outcomes once patient is cleared to return to exercise.             ITP Comments:  ITP Comments     Row Name 03/19/21 1150 04/10/21 1701 05/09/21 1038       ITP Comments Dr Traci TFransico Himdical Director 30 Day ITP Review. Trevaris hRishaben doing well with exercise at cardiac rehab last session on 04/03/21. Kveon hWaverlyNSTEMI and stent placement [facility]. 30 Day ITP Review. Kwaku exercise is currently on hold pending clearance from [facility]   [facility]       Comments: See  ITP comments.Barnet Pall, RN,BSN 05/09/2021 10:45 AM

## 2021-05-10 ENCOUNTER — Encounter (HOSPITAL_COMMUNITY): Payer: Medicare Other

## 2021-05-13 ENCOUNTER — Encounter (HOSPITAL_COMMUNITY): Payer: Medicare Other

## 2021-05-13 ENCOUNTER — Telehealth (HOSPITAL_COMMUNITY): Payer: Self-pay | Admitting: *Deleted

## 2021-05-13 NOTE — Telephone Encounter (Signed)
Spoke with [name]. Received the referral from [place] cardiology for [name] [name] to resume exercise at cardiac rehab. Also received clearance from [facility] to exercise after dialysis. [name] will return to exercise at cardiac rehab on Wednesday.Barnet Pall, RN,BSN 05/13/2021 3:42 PM

## 2021-05-14 DIAGNOSIS — N186 End stage renal disease: Secondary | ICD-10-CM | POA: Diagnosis not present

## 2021-05-14 DIAGNOSIS — T82858A Stenosis of vascular prosthetic devices, implants and grafts, initial encounter: Secondary | ICD-10-CM | POA: Diagnosis not present

## 2021-05-14 DIAGNOSIS — Z992 Dependence on renal dialysis: Secondary | ICD-10-CM | POA: Diagnosis not present

## 2021-05-14 DIAGNOSIS — I871 Compression of vein: Secondary | ICD-10-CM | POA: Diagnosis not present

## 2021-05-15 ENCOUNTER — Encounter (HOSPITAL_COMMUNITY): Payer: Medicare Other

## 2021-05-15 ENCOUNTER — Other Ambulatory Visit: Payer: Self-pay

## 2021-05-15 ENCOUNTER — Encounter (HOSPITAL_COMMUNITY)
Admission: RE | Admit: 2021-05-15 | Discharge: 2021-05-15 | Disposition: A | Discharge status: Home / Self Care | Payer: Medicare Other | Source: Ambulatory Visit | Attending: Cardiology | Admitting: Cardiology

## 2021-05-15 DIAGNOSIS — I214 Non-ST elevation (NSTEMI) myocardial infarction: Secondary | ICD-10-CM | POA: Diagnosis not present

## 2021-05-15 DIAGNOSIS — Z955 Presence of coronary angioplasty implant and graft: Secondary | ICD-10-CM | POA: Diagnosis not present

## 2021-05-15 NOTE — Progress Notes (Signed)
Michon returned to cardiac rehab and exercised without difficulty.Will continue to monitor the patient throughout  the program. Barnet Pall, RN,BSN 05/15/2021 4:27 PM

## 2021-05-17 ENCOUNTER — Encounter (HOSPITAL_COMMUNITY): Payer: Medicare Other

## 2021-05-17 ENCOUNTER — Other Ambulatory Visit: Payer: Self-pay

## 2021-05-17 ENCOUNTER — Encounter (HOSPITAL_COMMUNITY)
Admission: RE | Admit: 2021-05-17 | Discharge: 2021-05-17 | Disposition: A | Discharge status: Home / Self Care | Payer: Medicare Other | Source: Ambulatory Visit | Attending: Cardiology | Admitting: Cardiology

## 2021-05-17 DIAGNOSIS — I214 Non-ST elevation (NSTEMI) myocardial infarction: Secondary | ICD-10-CM | POA: Diagnosis not present

## 2021-05-17 DIAGNOSIS — Z955 Presence of coronary angioplasty implant and graft: Secondary | ICD-10-CM

## 2021-05-20 ENCOUNTER — Encounter (HOSPITAL_COMMUNITY)
Admission: RE | Admit: 2021-05-20 | Discharge: 2021-05-20 | Disposition: A | Discharge status: Home / Self Care | Payer: Medicare Other | Source: Ambulatory Visit | Attending: Cardiology | Admitting: Cardiology

## 2021-05-20 ENCOUNTER — Other Ambulatory Visit: Payer: Self-pay

## 2021-05-20 DIAGNOSIS — N2581 Secondary hyperparathyroidism of renal origin: Secondary | ICD-10-CM | POA: Diagnosis not present

## 2021-05-20 DIAGNOSIS — D509 Iron deficiency anemia, unspecified: Secondary | ICD-10-CM | POA: Diagnosis not present

## 2021-05-20 DIAGNOSIS — Z5189 Encounter for other specified aftercare: Secondary | ICD-10-CM | POA: Insufficient documentation

## 2021-05-20 DIAGNOSIS — I214 Non-ST elevation (NSTEMI) myocardial infarction: Secondary | ICD-10-CM

## 2021-05-20 DIAGNOSIS — I252 Old myocardial infarction: Secondary | ICD-10-CM | POA: Insufficient documentation

## 2021-05-20 DIAGNOSIS — Z955 Presence of coronary angioplasty implant and graft: Secondary | ICD-10-CM | POA: Diagnosis not present

## 2021-05-20 DIAGNOSIS — Z992 Dependence on renal dialysis: Secondary | ICD-10-CM | POA: Diagnosis not present

## 2021-05-20 DIAGNOSIS — E1129 Type 2 diabetes mellitus with other diabetic kidney complication: Secondary | ICD-10-CM | POA: Diagnosis not present

## 2021-05-20 DIAGNOSIS — D631 Anemia in chronic kidney disease: Secondary | ICD-10-CM | POA: Diagnosis not present

## 2021-05-20 DIAGNOSIS — N186 End stage renal disease: Secondary | ICD-10-CM | POA: Diagnosis not present

## 2021-05-20 NOTE — Progress Notes (Signed)
CARDIAC REHAB PHASE 2  Reviewed home exercise with pt today. Pt states he has a sore on his foot that tends to ooze if he over exerts himself with weight bearing exercises. Pt does not want to return to his gym yet because they do not offer a lot of covid19 precautions and pt would feel more safe at home. Offered pt a printout with 3 chair fitness youtube courses, he states he can do those sessions 2 times a week for 30 minutes. Reinforced warm up, cool down and stretches. Reviewed S/S to stop exercise and when to call MD vs 911. Also advised pt on RPE scale, THRR and temperature precautions. Pt states he feels like he still needs time to reach his goals, but is doing well with exercise so far. Pt verbalized understanding, all questions were answered and he was given a copy.   Kirby Funk ACSM-EP 05/20/2021 4:33 PM

## 2021-05-22 ENCOUNTER — Encounter (HOSPITAL_COMMUNITY)
Admission: RE | Admit: 2021-05-22 | Discharge: 2021-05-22 | Disposition: A | Discharge status: Home / Self Care | Payer: Medicare Other | Source: Ambulatory Visit | Attending: Cardiology | Admitting: Cardiology

## 2021-05-22 ENCOUNTER — Other Ambulatory Visit: Payer: Self-pay

## 2021-05-22 DIAGNOSIS — I214 Non-ST elevation (NSTEMI) myocardial infarction: Secondary | ICD-10-CM

## 2021-05-22 DIAGNOSIS — Z5189 Encounter for other specified aftercare: Secondary | ICD-10-CM | POA: Diagnosis not present

## 2021-05-22 DIAGNOSIS — Z955 Presence of coronary angioplasty implant and graft: Secondary | ICD-10-CM

## 2021-05-23 DIAGNOSIS — N186 End stage renal disease: Secondary | ICD-10-CM | POA: Diagnosis not present

## 2021-05-23 DIAGNOSIS — E1122 Type 2 diabetes mellitus with diabetic chronic kidney disease: Secondary | ICD-10-CM | POA: Diagnosis not present

## 2021-05-23 DIAGNOSIS — T792XXA Traumatic secondary and recurrent hemorrhage and seroma, initial encounter: Secondary | ICD-10-CM | POA: Diagnosis not present

## 2021-05-23 DIAGNOSIS — G4700 Insomnia, unspecified: Secondary | ICD-10-CM | POA: Diagnosis not present

## 2021-05-23 DIAGNOSIS — I2510 Atherosclerotic heart disease of native coronary artery without angina pectoris: Secondary | ICD-10-CM | POA: Diagnosis not present

## 2021-05-23 DIAGNOSIS — L97512 Non-pressure chronic ulcer of other part of right foot with fat layer exposed: Secondary | ICD-10-CM | POA: Diagnosis not present

## 2021-05-23 DIAGNOSIS — I10 Essential (primary) hypertension: Secondary | ICD-10-CM | POA: Diagnosis not present

## 2021-05-23 DIAGNOSIS — Z89431 Acquired absence of right foot: Secondary | ICD-10-CM | POA: Diagnosis not present

## 2021-05-23 DIAGNOSIS — I739 Peripheral vascular disease, unspecified: Secondary | ICD-10-CM | POA: Diagnosis not present

## 2021-05-23 DIAGNOSIS — S98321D Partial traumatic amputation of right midfoot, subsequent encounter: Secondary | ICD-10-CM | POA: Diagnosis not present

## 2021-05-24 ENCOUNTER — Telehealth (HOSPITAL_COMMUNITY): Payer: Self-pay | Admitting: *Deleted

## 2021-05-24 ENCOUNTER — Encounter (HOSPITAL_COMMUNITY)
Admission: RE | Admit: 2021-05-24 | Discharge: 2021-05-24 | Disposition: A | Discharge status: Home / Self Care | Payer: Medicare Other | Source: Ambulatory Visit | Attending: Cardiology | Admitting: Cardiology

## 2021-05-24 ENCOUNTER — Other Ambulatory Visit: Payer: Self-pay

## 2021-05-24 DIAGNOSIS — Z955 Presence of coronary angioplasty implant and graft: Secondary | ICD-10-CM

## 2021-05-24 DIAGNOSIS — I214 Non-ST elevation (NSTEMI) myocardial infarction: Secondary | ICD-10-CM

## 2021-05-24 NOTE — Progress Notes (Signed)
Spoke with Jeneen Rinks regarding which provider placed him in the boot.  Akoni indicated Dr. Berdine Addison in [place]. Called Dr. Berdine Addison at [facility] and spoke with her nurse Kipp Laurence.  Pt seen on yesterday and per office note, ok for pt to return to exercise with cycling as long as he is offloading the pressure off the "ball of his foot" and wear the non weight bearing boot for walking. Will fax the office note for continuity of care. Cherre Huger, BSN Cardiac and Training and development officer

## 2021-05-24 NOTE — Telephone Encounter (Signed)
Called and spoke to [name] regarding conversation I had with Dr. Berdine Addison nurse.  Plan to have him bring an athletic shoe with him to exercise.  Wear the CAM boot in while ambulating once he gets in the gym switch over to his athletic shoe with gauze in the bottom of his shoe to offload any pressure on his foot non -healing area. [name] that he can lower his right leg while on the stepper to allow for breaks.  Once exercise is completed he may place the CAM boot back on for walking out of the gym.  [name] felt he could do the nustep for 30 minutes as opposed to the recumbent bike where he had discomfort due to the strap. Will return on Monday. [name], BSN Cardiac and Training and development officer

## 2021-05-24 NOTE — Progress Notes (Signed)
Pt arrived to the [facility] program with a non-weight bearing boot on his right foot. Pt not allowed to exercise today. The pt's MD will be contacted to obtain permission for patient to resume exercise.

## 2021-05-27 ENCOUNTER — Encounter (HOSPITAL_COMMUNITY): Payer: Medicare Other

## 2021-05-29 ENCOUNTER — Encounter (HOSPITAL_COMMUNITY)
Admission: RE | Admit: 2021-05-29 | Discharge: 2021-05-29 | Disposition: A | Discharge status: Home / Self Care | Payer: Medicare Other | Source: Ambulatory Visit | Attending: Cardiology | Admitting: Cardiology

## 2021-05-29 ENCOUNTER — Other Ambulatory Visit: Payer: Self-pay

## 2021-05-29 DIAGNOSIS — Z955 Presence of coronary angioplasty implant and graft: Secondary | ICD-10-CM

## 2021-05-29 DIAGNOSIS — I214 Non-ST elevation (NSTEMI) myocardial infarction: Secondary | ICD-10-CM

## 2021-05-29 DIAGNOSIS — Z5189 Encounter for other specified aftercare: Secondary | ICD-10-CM | POA: Diagnosis not present

## 2021-05-29 IMAGING — DX RIGHT FOOT - 2 VIEW
2 series · 2 of 2 positions shown · non-contrast
Comparison: 05/06/2011

CLINICAL DATA: S/p partial amputation of the 2nd toe.

EXAM:
RIGHT FOOT - 2 VIEW

[foot]
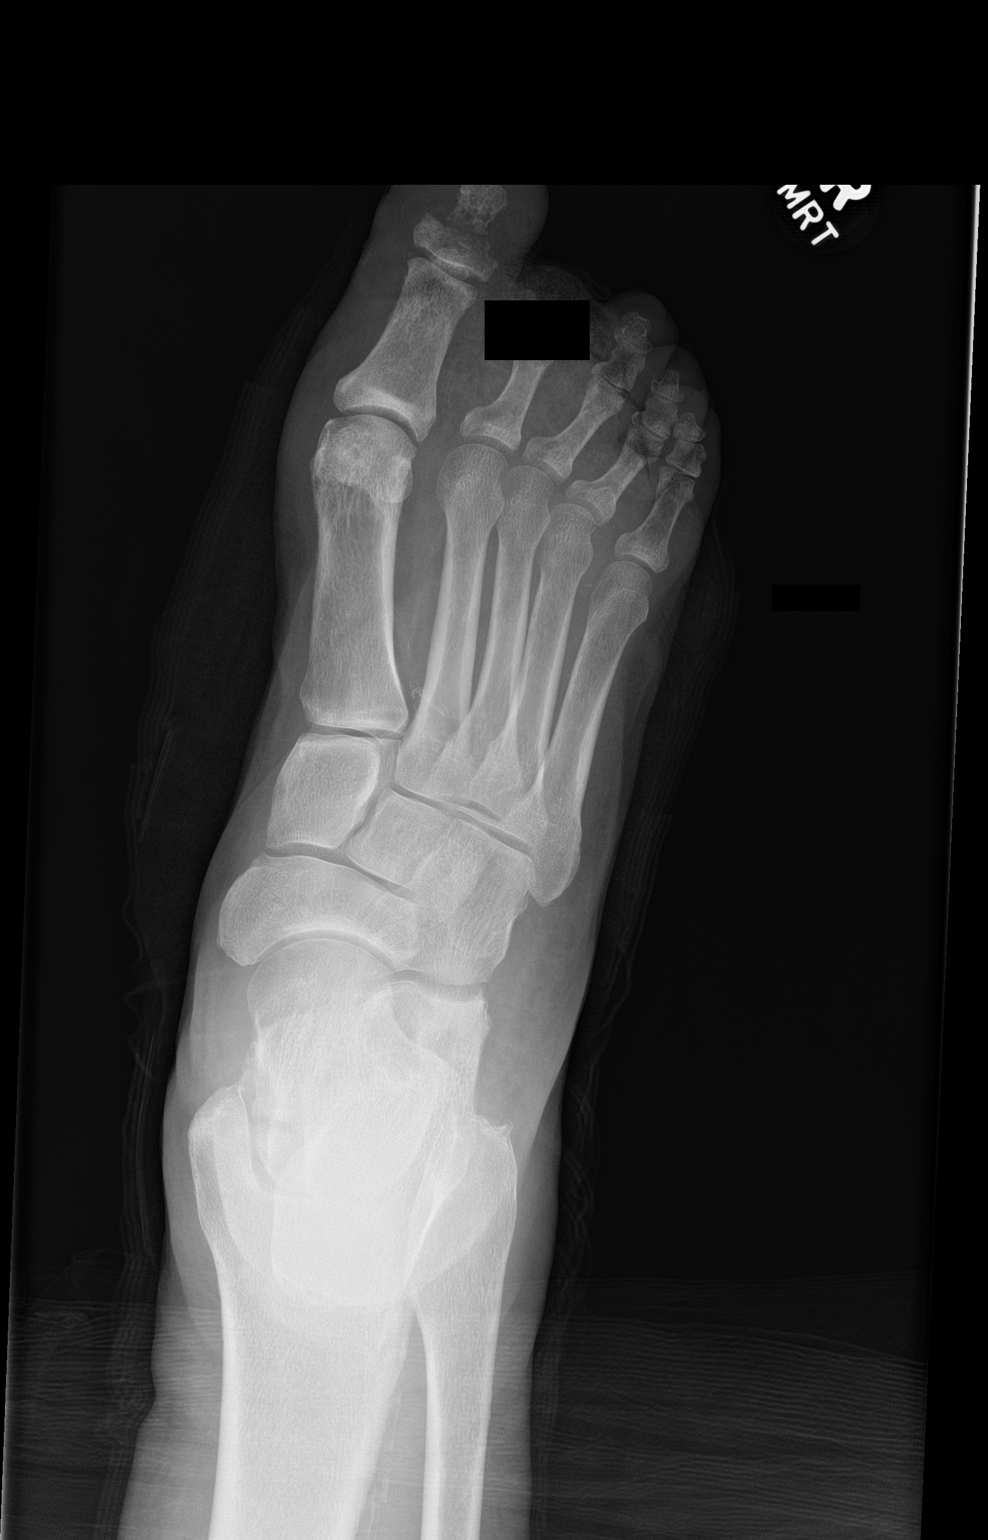

[leg]
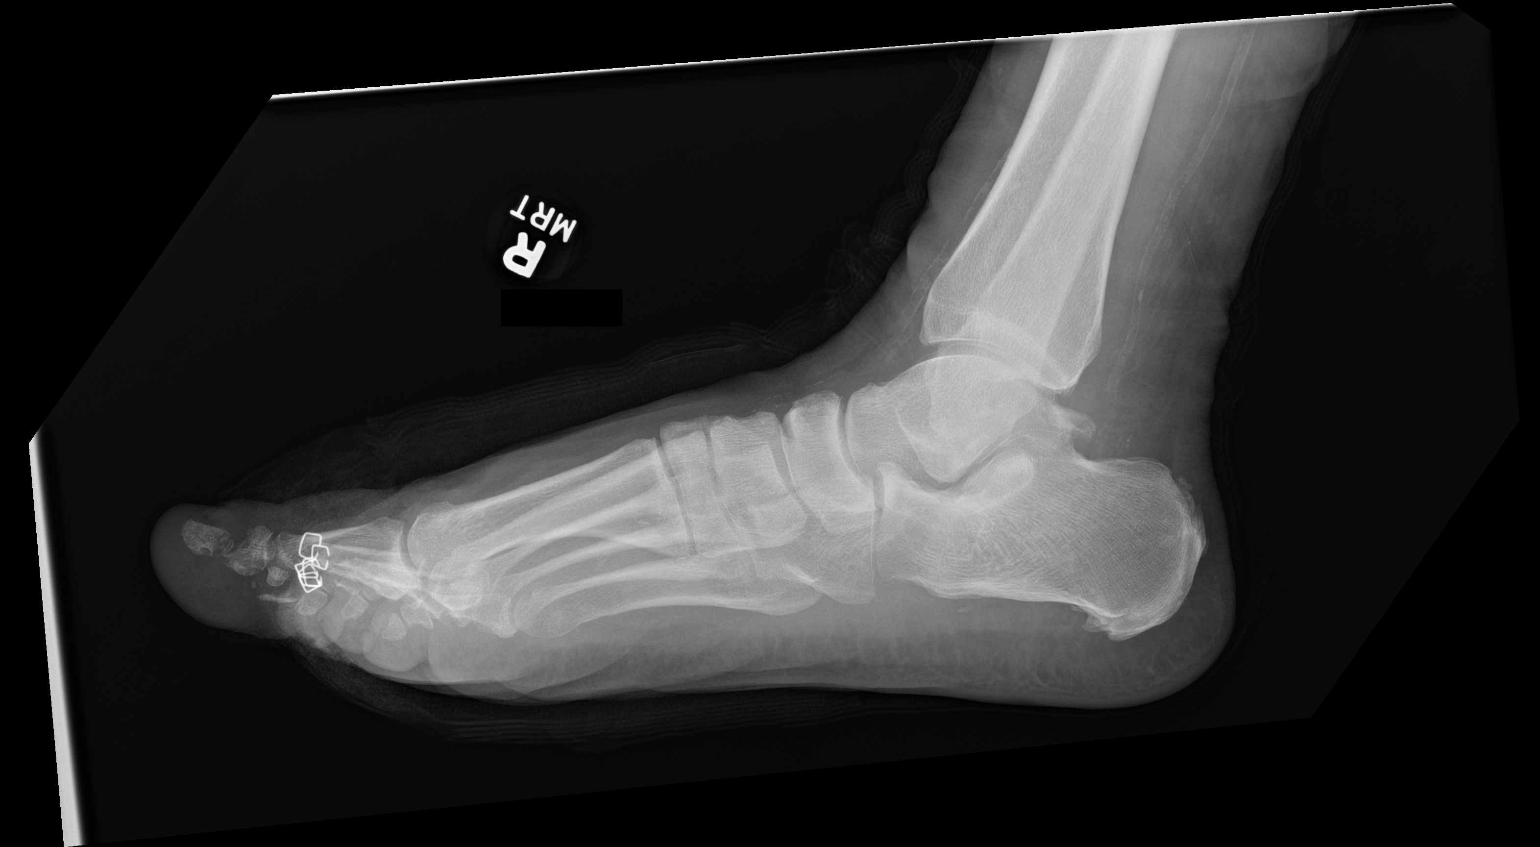

[2 of 2 positions shown; findings below may reference images not displayed]

FINDINGS: Amputation of the 2nd digit from the PIP noted.

Irregularity of the great toe distal phalanx is unchanged.

No other significant abnormalities identified.
IMPRESSION: Partial amputation of the 2nd digit.

## 2021-05-30 NOTE — Progress Notes (Signed)
Cardiac Individual Treatment Plan  Patient Details  Name: [name] MRN: [id number] Date of Birth: 03-10-1966 Referring Provider:   Flowsheet Row CARDIAC REHAB PHASE II ORIENTATION from 03/19/2021 in [place]  Referring Provider Dr. Redmond Pulling, MD (Covering)       Initial Encounter Date:  [place] from 03/19/2021 in [place]  Date 03/19/21       Visit Diagnosis:  09/21/20 NSTEMI    09/24/21 S/P DES OM2,  S/P PCI/ DES D2 at [facility]  Patient's Home Medications on Admission:  Current Outpatient Medications:    amLODipine (NORVASC) 10 MG tablet, Take 10 mg by mouth at bedtime. , Disp: , Rfl:    aspirin EC 81 MG tablet, Take 1 tablet (81 mg total) by mouth daily., Disp: 90 tablet, Rfl: 3   blood glucose meter kit and supplies, Dispense based on patient and insurance preference. Use up to four times daily as directed. (FOR ICD-10 E10.9, E11.9)., Disp: 1 each, Rfl: 0   blood glucose meter kit and supplies, Dispense based on patient and insurance preference. Use up to four times daily as directed. (FOR ICD-9 250.00, 250.01)., Disp: 1 each, Rfl: 0   Continuous Blood Gluc Sensor (FREESTYLE LIBRE 14 DAY SENSOR) MISC, See admin instructions., Disp: , Rfl:    glucose blood (ACCU-CHEK AVIVA PLUS) test strip, Use as instructed, Disp: 100 each, Rfl: 1   hydrALAZINE (APRESOLINE) 50 MG tablet, Take 50 mg by mouth 3 (three) times daily., Disp: , Rfl:    isosorbide dinitrate (ISORDIL) 20 MG tablet, Take 20 mg by mouth 3 (three) times daily., Disp: , Rfl:    Lancet Devices (ACCU-CHEK SOFTCLIX) lancets, Use as instructed, Disp: 100 each, Rfl: 5   lidocaine-prilocaine (EMLA) cream, Apply 1 application topically as needed. (Patient taking differently: Apply 1 application topically as needed (port access).), Disp: 30 g, Rfl: 6   LOKELMA 10 g PACK packet, Take 10 g by  mouth. Patient says he takes on HD days, Disp: , Rfl:    propranolol (INDERAL) 80 MG tablet, Take 1 tablet (80 mg total) by mouth 2 (two) times daily., Disp: 60 tablet, Rfl: 1   sevelamer carbonate (RENVELA) 800 MG tablet, Take 1,600 mg by mouth 3 (three) times daily with meals., Disp: , Rfl:    ticagrelor (BRILINTA) 90 MG TABS tablet, TAKE 1 TABLET (90 MG TOTAL) BY MOUTH TWO TIMES DAILY., Disp: 60 tablet, Rfl: 2  Past Medical History: Past Medical History:  Diagnosis Date   Abscess of right foot    Acute osteomyelitis of right foot ([place]) 05/15/2019   Anemia 06/23/2019   Cellulitis of right foot 05/22/2019   Chest pain 08/23/2013   Chills    at night - sometimes   Complication of anesthesia    Coronary artery disease    Diabetes mellitus    controlled by diet   Diabetes with renal manifestations(250.4) 08/23/2013   Diabetic foot ulcer- right second toe 05/14/2019   Diabetic infection of right foot ([place])    Diabetic retinopathy associated with type 2 diabetes mellitus ([place]) 06/23/2019   Eczema    ESRD on hemodialysis ([place]) 07/18/2011   Essential hypertension 10/18/2007   Qualifier: Diagnosis of  By: Loanne Drilling MD, Sean A    Fatigue    loss of fatigue   FOOT ULCER, LEFT 06/29/2008   Qualifier: Diagnosis of  By: Loanne Drilling MD, Jacelyn Pi  Gangrene of toe of right foot (HCC)    Headache(784.0)    Years ago   Heart murmur    years ago   History of blood transfusion    Hyperglycemia 05/05/2015   HYPERLIPIDEMIA 05/16/2008   Qualifier: Diagnosis of  By: Jonny Ruiz MD, Len Blalock    Hypoglycemia, unspecified 11/13/2008   Qualifier: Diagnosis of  By: Jonny Ruiz MD, Len Blalock    MRSA (methicillin resistant staph aureus) culture positive    MRSA INFECTION 10/18/2007   Qualifier: Diagnosis of  By: Charlsie Quest RMA, Lucy     Myocardial infarction (HCC)    Obesity, unspecified 08/23/2013   Osteomyelitis of great toe of right foot (HCC) 05/14/2019   Other forms of retinal detachment(361.89) 06/23/2019   PONV (postoperative nausea  and vomiting)    Poor circulation    Secondary hyperparathyroidism (HCC) 06/23/2019   Sepsis (HCC) 05/14/2019   Type 2 diabetes mellitus (HCC) 08/23/2013   Ulcer of right foot, with necrosis of bone (HCC)    Ulcer of right second toe with necrosis of bone (HCC)     Tobacco Use: Social History   Tobacco Use  Smoking Status Never  Smokeless Tobacco Never    Labs: Recent Review Flowsheet Data     Labs for ITP Cardiac and Pulmonary Rehab Latest Ref Rng & Units 09/22/2020 12/27/2020 01/07/2021 01/08/2021 01/08/2021   Cholestrol 0 - 200 mg/dL 045 - - - -   LDLCALC 0 - 99 mg/dL 48 - - - -   LDLDIRECT mg/dL - - - - -   HDL >80 mg/dL 69 - - - -   Trlycerides <150 mg/dL 73 - - - -   Hemoglobin A1c 4.8 - 5.6 % 7.4(H) - - - -   PHART 7.350 - 7.450 - - - - -   PCO2ART 32.0 - 48.0 mmHg - - - - -   HCO3 20.0 - 28.0 mmol/L - - - - -   TCO2 22 - 32 mmol/L - 28 27 23  32   O2SAT % - - - - -       Capillary Blood Glucose: Lab Results  Component Value Date   GLUCAP 182 (H) 03/25/2021   GLUCAP 112 (H) 01/08/2021   GLUCAP 154 (H) 01/08/2021   GLUCAP 100 (H) 01/07/2021   GLUCAP 135 (H) 01/07/2021     Exercise Target Goals: Exercise Program Goal: Individual exercise prescription set using results from initial 6 min walk test and THRR while considering  patient's activity barriers and safety.   Exercise Prescription Goal: Initial exercise prescription builds to 30-45 minutes a day of aerobic activity, 2-3 days per week.  Home exercise guidelines will be given to patient during program as part of exercise prescription that the participant will acknowledge.  Activity Barriers & Risk Stratification:  Activity Barriers & Cardiac Risk Stratification - 03/19/21 1227       Activity Barriers & Cardiac Risk Stratification   Activity Barriers Arthritis;Back Problems;Joint Problems;Deconditioning;Balance Concerns;History of Falls;Other (comment)    Comments Transmetatarasal amputation of the right  foot. Chroinc bilateral shoulder problems/pain using arms above the head. Pt has ESRD on HD. Dizziness with change of position, especially after HD.    Cardiac Risk Stratification High             6 Minute Walk:  6 Minute Walk     Row Name 03/19/21 1246         6 Minute Walk   Phase Initial     Distance 1378  feet  performed on [place]     Walk Time 6 minutes     # of Rest Breaks 0     MPH 2.61     METS 3.63     RPE 7     Perceived Dyspnea  0     VO2 Peak 12.71     Symptoms No     Resting HR 71 bpm     Resting BP 140/80     Resting Oxygen Saturation  99 %     Exercise Oxygen Saturation  during 6 min walk 98 %     Max Ex. HR 75 bpm     Max Ex. BP 138/76     2 Minute Post BP 122/74              Oxygen Initial Assessment:   Oxygen Re-Evaluation:   Oxygen Discharge (Final Oxygen Re-Evaluation):   Initial Exercise Prescription:  Initial Exercise Prescription - 03/19/21 1300       Date of Initial Exercise RX and Referring Provider   Date 03/19/21    Referring Provider Dr. Redmond Pulling, MD (Covering)    Expected Discharge Date 05/17/21      Recumbant Bike   Level 2    RPM 60    Minutes 15    METs 2.2      NuStep   Level 3    SPM 75    Minutes 15    METs 2.2      Prescription Details   Frequency (times per week) 3    Duration Progress to 30 minutes of continuous aerobic without signs/symptoms of physical distress      Intensity   THRR 40-80% of Max Heartrate 66-132    Ratings of Perceived Exertion 11-13    Perceived Dyspnea 0-4      Progression   Progression Continue progressive overload as per policy without signs/symptoms or physical distress.      Resistance Training   Training Prescription Yes    Weight 3 lbs    Reps 10-15             Perform Capillary Blood Glucose checks as needed.  Exercise Prescription Changes:   Exercise Prescription Changes     Row Name 03/25/21 1630 05/20/21 1600           Response to  Exercise   Blood Pressure (Admit) 160/80 160/80      Blood Pressure (Exercise) 140/78 144/78      Blood Pressure (Exit) 128/78 128/82      Heart Rate (Admit) 87 bpm 71 bpm      Heart Rate (Exercise) 85 bpm 78 bpm      Heart Rate (Exit) 77 bpm 71 bpm      Rating of Perceived Exertion (Exercise) 9 12      Perceived Dyspnea (Exercise) -- 0      Symptoms None None      Comments Pt's first day in the CRP2 program Reviewed MET's goals and home exercise      Duration Progress to 30 minutes of  aerobic without signs/symptoms of physical distress Progress to 30 minutes of  aerobic without signs/symptoms of physical distress      Intensity THRR unchanged THRR unchanged             Progression   Progression Continue to progress workloads to maintain intensity without signs/symptoms of physical distress. Continue to progress workloads to maintain intensity without signs/symptoms of physical distress.  Average METs 2 2             Resistance Training   Training Prescription Yes Yes      Weight 3 lbs 3 lbs      Reps 10-15 10-15      Time 10 Minutes 10 Minutes             Interval Training   Interval Training No No             Recumbant Bike   Level 2 2      RPM 55 55      Minutes 15 15      METs 1.9 2             NuStep   Level 2 4      SPM 80 80      Minutes 15 15      METs 2 2             Home Exercise Plan   Plans to continue exercise at -- Home (comment)      Frequency -- Add 2 additional days to program exercise sessions.      Initial Home Exercises Provided -- 05/20/21               Exercise Comments:   Exercise Comments     Row Name 03/25/21 1625 04/10/21 1619 05/20/21 1633       Exercise Comments Pt first day in the [place] program. Pt toleratred session well. Pt has chronic shoulder issues that prevent him from using weights above the shouders. Unable to go over MET's and goals because pt has been absent Reviewed MET's, goals and home exercise with pt today.  Pt is tolerating exercise well with an average MET level of 2.0. Pt states he has a sore on his foot that tends to ooze if he over exerts himself with weight bearing exercises. Offered pt a printout with 3 chair fitness youtube courses, he states he can do those sessions 2 times a week for 30 minutes. Pt states he feels like he still needs time to reach his goals, but is doing well with exercise so far.              Exercise Goals and Review:   Exercise Goals     Row Name 03/19/21 1229             Exercise Goals   Increase Physical Activity Yes       Intervention Provide advice, education, support and counseling about physical activity/exercise needs.;Develop an individualized exercise prescription for aerobic and resistive training based on initial evaluation findings, risk stratification, comorbidities and participant's personal goals.       Expected Outcomes Short Term: Attend rehab on a regular basis to increase amount of physical activity.;Long Term: Add in home exercise to make exercise part of routine and to increase amount of physical activity.;Long Term: Exercising regularly at least 3-5 days a week.       Increase Strength and Stamina Yes       Intervention Provide advice, education, support and counseling about physical activity/exercise needs.;Develop an individualized exercise prescription for aerobic and resistive training based on initial evaluation findings, risk stratification, comorbidities and participant's personal goals.       Expected Outcomes Short Term: Increase workloads from initial exercise prescription for resistance, speed, and METs.;Short Term: Perform resistance training exercises routinely during rehab and add in resistance training at home;Long Term: Improve cardiorespiratory fitness, muscular  endurance and strength as measured by increased METs and functional capacity (6MWT)       Able to understand and use rate of perceived exertion (RPE) scale Yes        Intervention Provide education and explanation on how to use RPE scale       Expected Outcomes Short Term: Able to use RPE daily in rehab to express subjective intensity level;Long Term:  Able to use RPE to guide intensity level when exercising independently       Knowledge and understanding of Target Heart Rate Range (THRR) Yes       Intervention Provide education and explanation of THRR including how the numbers were predicted and where they are located for reference       Expected Outcomes Short Term: Able to state/look up THRR;Short Term: Able to use daily as guideline for intensity in rehab;Long Term: Able to use THRR to govern intensity when exercising independently       Understanding of Exercise Prescription Yes       Intervention Provide education, explanation, and written materials on patient's individual exercise prescription       Expected Outcomes Short Term: Able to explain program exercise prescription;Long Term: Able to explain home exercise prescription to exercise independently                Exercise Goals Re-Evaluation :  Exercise Goals Re-Evaluation     Row Name 03/25/21 1625 05/20/21 1625           Exercise Goal Re-Evaluation   Exercise Goals Review Increase Physical Activity;Increase Strength and Stamina;Able to understand and use rate of perceived exertion (RPE) scale;Knowledge and understanding of Target Heart Rate Range (THRR);Understanding of Exercise Prescription Increase Physical Activity;Increase Strength and Stamina;Able to understand and use rate of perceived exertion (RPE) scale;Knowledge and understanding of Target Heart Rate Range (THRR);Understanding of Exercise Prescription      Comments Pt's first day in the CRP2 program. Pt understnads the Exercise Rx, THRR, and RPE scale. Reviewed MET's, goals and home exercise with pt today. Pt is tolerating exercise well with an average MET level of 2.0. Pt states he has a sore on his foot that tends to ooze if he  over exerts himself with weight bearing exercises. Offered pt a printout with 3 chair fitness youtube courses, he states he can do those sessions 2 times a week for 30 minutes. Pt states he feels like he still needs time to reach his goals, but is doing well with exercise so far.      Expected Outcomes Will continue to monior patient and advance exercise workloads as tolerated. Pt will add in 2 days of chair fitness courses that will last about 30 minutes. Will continue to monitor pt and progress workloads as tolerated without sign or symptom.               Discharge Exercise Prescription (Final Exercise Prescription Changes):  Exercise Prescription Changes - 05/20/21 1600       Response to Exercise   Blood Pressure (Admit) 160/80    Blood Pressure (Exercise) 144/78    Blood Pressure (Exit) 128/82    Heart Rate (Admit) 71 bpm    Heart Rate (Exercise) 78 bpm    Heart Rate (Exit) 71 bpm    Rating of Perceived Exertion (Exercise) 12    Perceived Dyspnea (Exercise) 0    Symptoms None    Comments Reviewed MET's goals and home exercise    Duration Progress to 30  minutes of  aerobic without signs/symptoms of physical distress    Intensity THRR unchanged      Progression   Progression Continue to progress workloads to maintain intensity without signs/symptoms of physical distress.    Average METs 2      Resistance Training   Training Prescription Yes    Weight 3 lbs    Reps 10-15    Time 10 Minutes      Interval Training   Interval Training No      Recumbant Bike   Level 2    RPM 55    Minutes 15    METs 2      NuStep   Level 4    SPM 80    Minutes 15    METs 2      Home Exercise Plan   Plans to continue exercise at Home (comment)    Frequency Add 2 additional days to program exercise sessions.    Initial Home Exercises Provided 05/20/21             Nutrition:  Target Goals: Understanding of nutrition guidelines, daily intake of sodium 1500mg , cholesterol  200mg , calories 30% from fat and 7% or less from saturated fats, daily to have 5 or more servings of fruits and vegetables.  Biometrics:  Pre Biometrics - 03/19/21 1023       Pre Biometrics   Waist Circumference 42 inches    Hip Circumference 40.5 inches    Waist to Hip Ratio 1.04 %    Triceps Skinfold 10 mm    % Body Fat 26.6 %    Grip Strength 36 kg    Flexibility 13.5 in    Single Leg Stand 2.32 seconds              Nutrition Therapy Plan and Nutrition Goals:  Nutrition Therapy & Goals - 04/04/21 1006       Nutrition Therapy   Diet TLC; reduced phos and potassium      Personal Nutrition Goals   Nutrition Goal Pt to build a healthy plate including vegetables, fruits, whole grains, and low-fat dairy products in a heart healthy meal plan.    Personal Goal #2 Pt to continue follow instructions from renal dietitian to modify phos and potassium intake      Intervention Plan   Intervention Prescribe, educate and counsel regarding individualized specific dietary modifications aiming towards targeted core components such as weight, hypertension, lipid management, diabetes, heart failure and other comorbidities.;Nutrition handout(s) given to patient.    Expected Outcomes Short Term Goal: Understand basic principles of dietary content, such as calories, fat, sodium, cholesterol and nutrients.;Long Term Goal: Adherence to prescribed nutrition plan.             Nutrition Assessments:  MEDIFICTS Score Key: ?70 Need to make dietary changes  40-70 Heart Healthy Diet ? 40 Therapeutic Level Cholesterol Diet    Picture Your Plate Scores: <67 Unhealthy dietary pattern with much room for improvement. 41-50 Dietary pattern unlikely to meet recommendations for good health and room for improvement. 51-60 More healthful dietary pattern, with some room for improvement.  >60 Healthy dietary pattern, although there may be some specific behaviors that could be improved.    Nutrition  Goals Re-Evaluation:  Nutrition Goals Re-Evaluation     Row Name 04/04/21 1007 05/30/21 1134           Goals   Current Weight 195 lb (88.5 kg) 188 lb 4.4 oz (85.4 kg)  Nutrition Goal Pt to build a healthy plate including vegetables, fruits, whole grains, and low-fat dairy products in a heart healthy meal plan. Pt to build a healthy plate including vegetables, fruits, whole grains, and low-fat dairy products in a heart healthy meal plan.             Personal Goal #2 Re-Evaluation   Personal Goal #2 Pt to continue follow instructions from renal dietitian to modify phos and potassium intake Pt to continue follow instructions from renal dietitian to modify phos and potassium intake               Nutrition Goals Re-Evaluation:  Nutrition Goals Re-Evaluation     [place] Name 04/04/21 1007 05/30/21 1134           Goals   Current Weight 195 lb (88.5 kg) 188 lb 4.4 oz (85.4 kg)      Nutrition Goal Pt to build a healthy plate including vegetables, fruits, whole grains, and low-fat dairy products in a heart healthy meal plan. Pt to build a healthy plate including vegetables, fruits, whole grains, and low-fat dairy products in a heart healthy meal plan.             Personal Goal #2 Re-Evaluation   Personal Goal #2 Pt to continue follow instructions from renal dietitian to modify phos and potassium intake Pt to continue follow instructions from renal dietitian to modify phos and potassium intake               Nutrition Goals Discharge (Final Nutrition Goals Re-Evaluation):  Nutrition Goals Re-Evaluation - 05/30/21 1134       Goals   Current Weight 188 lb 4.4 oz (85.4 kg)    Nutrition Goal Pt to build a healthy plate including vegetables, fruits, whole grains, and low-fat dairy products in a heart healthy meal plan.      Personal Goal #2 Re-Evaluation   Personal Goal #2 Pt to continue follow instructions from renal dietitian to modify phos and potassium intake              Psychosocial: Target Goals: Acknowledge presence or absence of significant depression and/or stress, maximize coping skills, provide positive support system. Participant is able to verbalize types and ability to use techniques and skills needed for reducing stress and depression.  Initial Review & Psychosocial Screening:  Initial Psych Review & Screening - 03/19/21 1151       Initial Review   Current issues with Current Stress Concerns    Source of Stress Concerns Chronic Illness;Unable to participate in former interests or hobbies;Unable to perform yard/household activities    Comments Cecil receives HD on Monday, Wednesday and Fridays      [place]? Yes   Arvid has his wife for support     Barriers   Psychosocial barriers to participate in program The patient should benefit from training in stress management and relaxation.      Screening Interventions   Interventions Encouraged to exercise    Expected Outcomes Long Term Goal: Stressors or current issues are controlled or eliminated.             Quality of Life Scores:  Quality of Life - 03/19/21 1245       Quality of Life   Select Quality of Life      Quality of Life Scores   Health/Function Pre 30 %    Socioeconomic Pre 30 %    Psych/Spiritual Pre 30 %  Family Pre 30 %    GLOBAL Pre 30 %            Scores of 19 and below usually indicate a poorer quality of life in these areas.  A difference of  2-3 points is a clinically meaningful difference.  A difference of 2-3 points in the total score of the Quality of Life Index has been associated with significant improvement in overall quality of life, self-image, physical symptoms, and general health in studies assessing change in quality of life.  PHQ-9: Recent Review Flowsheet Data     Depression screen [facility] 2/9 03/19/2021 05/10/2015   Decreased Interest 0 0   Down, Depressed, Hopeless 0 0   PHQ - 2 Score 0 0       Interpretation of Total Score  Total Score Depression Severity:  1-4 = Minimal depression, 5-9 = Mild depression, 10-14 = Moderate depression, 15-19 = Moderately severe depression, 20-27 = Severe depression   Psychosocial Evaluation and Intervention:   Psychosocial Re-Evaluation:  Psychosocial Re-Evaluation     Row Name 04/10/21 1703 05/09/21 1040 05/30/21 1518         Psychosocial Re-Evaluation   Current issues with Current Stress Concerns Current Stress Concerns Current Stress Concerns     Comments Shawna had a nonstemi the weekend of 04/07/21 at [facility]. Exercise is currently on hold Junius had a nonstemi the weekend of 04/07/21 at [facility]. Awaiting clearance from [facility] for Amol to return to exercise at cardiac rehab Ramello has not voiced any increased concerns or stressors despite his ESRD and recent  NSTEMI. Trindon tries to keep a positive attitude.     Interventions -- -- Encouraged to attend Cardiac Rehabilitation for the exercise;Stress management education     Continue Psychosocial Services  Follow up required by staff Follow up required by staff Follow up required by staff           Initial Review   Source of Stress Concerns Chronic Illness;Unable to perform yard/household activities;Unable to participate in former interests or hobbies Chronic Illness;Unable to perform yard/household activities;Unable to participate in former interests or hobbies Chronic Illness;Unable to perform yard/household activities;Unable to participate in former interests or hobbies     Comments -- Unable to reassess as exercise is currently on hold Will continue to monitor and offer support as needed.              Psychosocial Discharge (Final Psychosocial Re-Evaluation):  Psychosocial Re-Evaluation - 05/30/21 1518       Psychosocial Re-Evaluation   Current issues with Current Stress Concerns    Comments Story has not voiced any increased concerns or stressors despite his ESRD and  recent  NSTEMI. Zhi tries to keep a positive attitude.    Interventions Encouraged to attend Cardiac Rehabilitation for the exercise;Stress management education    Continue Psychosocial Services  Follow up required by staff      Initial Review   Source of Stress Concerns Chronic Illness;Unable to perform yard/household activities;Unable to participate in former interests or hobbies    Comments Will continue to monitor and offer support as needed.             Vocational Rehabilitation: Provide vocational rehab assistance to qualifying candidates.   Vocational Rehab Evaluation & Intervention:  Vocational Rehab - 03/19/21 1155       Initial Vocational Rehab Evaluation & Intervention   Assessment shows need for Vocational Rehabilitation No   Jveon is on disabilty and does not need  vocational rehab at this time            Education: Education Goals: Education classes will be provided on a weekly basis, covering required topics. Participant will state understanding/return demonstration of topics presented.  Learning Barriers/Preferences:  Learning Barriers/Preferences - 03/19/21 1231       Learning Barriers/Preferences   Learning Barriers Sight   wears glasses   Learning Preferences Audio;Verbal Instruction;Video;Written Material;Computer/Internet;Group Instruction;Individual Instruction;Pictoral;Skilled Demonstration             Education Topics: Count Your Pulse:  -Group instruction provided by verbal instruction, demonstration, patient participation and written materials to support subject.  Instructors address importance of being able to find your pulse and how to count your pulse when at home without a heart monitor.  Patients get hands on experience counting their pulse with staff help and individually.   Heart Attack, Angina, and Risk Factor Modification:  -Group instruction provided by verbal instruction, video, and written materials to support subject.   Instructors address signs and symptoms of angina and heart attacks.    Also discuss risk factors for heart disease and how to make changes to improve heart health risk factors.   Functional Fitness:  -Group instruction provided by verbal instruction, demonstration, patient participation, and written materials to support subject.  Instructors address safety measures for doing things around the house.  Discuss how to get up and down off the floor, how to pick things up properly, how to safely get out of a chair without assistance, and balance training.   Meditation and Mindfulness:  -Group instruction provided by verbal instruction, patient participation, and written materials to support subject.  Instructor addresses importance of mindfulness and meditation practice to help reduce stress and improve awareness.  Instructor also leads participants through a meditation exercise.    Stretching for Flexibility and Mobility:  -Group instruction provided by verbal instruction, patient participation, and written materials to support subject.  Instructors lead participants through series of stretches that are designed to increase flexibility thus improving mobility.  These stretches are additional exercise for major muscle groups that are typically performed during regular warm up and cool down.   Hands Only CPR:  -Group verbal, video, and participation provides a basic overview of AHA guidelines for community CPR. Role-play of emergencies allow participants the opportunity to practice calling for help and chest compression technique with discussion of AED use.   Hypertension: -Group verbal and written instruction that provides a basic overview of hypertension including the most recent diagnostic guidelines, risk factor reduction with self-care instructions and medication management.    Nutrition I class: Heart Healthy Eating:  -Group instruction provided by PowerPoint slides, verbal discussion, and  written materials to support subject matter. The instructor gives an explanation and review of the Therapeutic Lifestyle Changes diet recommendations, which includes a discussion on lipid goals, dietary fat, sodium, fiber, plant stanol/sterol esters, sugar, and the components of a well-balanced, healthy diet.   Nutrition II class: Lifestyle Skills:  -Group instruction provided by PowerPoint slides, verbal discussion, and written materials to support subject matter. The instructor gives an explanation and review of label reading, grocery shopping for heart health, heart healthy recipe modifications, and ways to make healthier choices when eating out.   Diabetes Question & Answer:  -Group instruction provided by PowerPoint slides, verbal discussion, and written materials to support subject matter. The instructor gives an explanation and review of diabetes co-morbidities, pre- and post-prandial blood glucose goals, pre-exercise blood glucose goals, signs, symptoms, and  treatment of hypoglycemia and hyperglycemia, and foot care basics.   Diabetes Blitz:  -Group instruction provided by PowerPoint slides, verbal discussion, and written materials to support subject matter. The instructor gives an explanation and review of the physiology behind type 1 and type 2 diabetes, diabetes medications and rational behind using different medications, pre- and post-prandial blood glucose recommendations and Hemoglobin A1c goals, diabetes diet, and exercise including blood glucose guidelines for exercising safely.    Portion Distortion:  -Group instruction provided by PowerPoint slides, verbal discussion, written materials, and food models to support subject matter. The instructor gives an explanation of serving size versus portion size, changes in portions sizes over the last 20 years, and what consists of a serving from each food group.   Stress Management:  -Group instruction provided by verbal instruction,  video, and written materials to support subject matter.  Instructors review role of stress in heart disease and how to cope with stress positively.     Exercising on Your Own:  -Group instruction provided by verbal instruction, power point, and written materials to support subject.  Instructors discuss benefits of exercise, components of exercise, frequency and intensity of exercise, and end points for exercise.  Also discuss use of nitroglycerin and activating EMS.  Review options of places to exercise outside of rehab.  Review guidelines for sex with heart disease.   Cardiac Drugs I:  -Group instruction provided by verbal instruction and written materials to support subject.  Instructor reviews cardiac drug classes: antiplatelets, anticoagulants, beta blockers, and statins.  Instructor discusses reasons, side effects, and lifestyle considerations for each drug class.   Cardiac Drugs II:  -Group instruction provided by verbal instruction and written materials to support subject.  Instructor reviews cardiac drug classes: angiotensin converting enzyme inhibitors (ACE-I), angiotensin II receptor blockers (ARBs), nitrates, and calcium channel blockers.  Instructor discusses reasons, side effects, and lifestyle considerations for each drug class.   Anatomy and Physiology of the Circulatory System:  Group verbal and written instruction and models provide basic cardiac anatomy and physiology, with the coronary electrical and arterial systems. Review of: AMI, Angina, Valve disease, Heart Failure, Peripheral Artery Disease, Cardiac Arrhythmia, Pacemakers, and the ICD.   Other Education:  -Group or individual verbal, written, or video instructions that support the educational goals of the cardiac rehab program.   Holiday Eating Survival Tips:  -Group instruction provided by PowerPoint slides, verbal discussion, and written materials to support subject matter. The instructor gives patients tips,  tricks, and techniques to help them not only survive but enjoy the holidays despite the onslaught of food that accompanies the holidays.   Knowledge Questionnaire Score:  Knowledge Questionnaire Score - 03/19/21 1309       Knowledge Questionnaire Score   Pre Score 22/24             Core Components/Risk Factors/Patient Goals at Admission:  Personal Goals and Risk Factors at Admission - 03/19/21 1309       Core Components/Risk Factors/Patient Goals on Admission    Weight Management Yes;Weight Loss    Intervention Weight Management: Develop a combined nutrition and exercise program designed to reach desired caloric intake, while maintaining appropriate intake of nutrient and fiber, sodium and fats, and appropriate energy expenditure required for the weight goal.;Weight Management: Provide education and appropriate resources to help participant work on and attain dietary goals.;Weight Management/Obesity: Establish reasonable short term and long term weight goals.    Admit Weight 195 lb 8.8 oz (88.7 kg)  Expected Outcomes Short Term: Continue to assess and modify interventions until short term weight is achieved;Long Term: Adherence to nutrition and physical activity/exercise program aimed toward attainment of established weight goal;Weight Maintenance: Understanding of the daily nutrition guidelines, which includes 25-35% calories from fat, 7% or less cal from saturated fats, less than $RemoveB'200mg'MjVpHkSz$  cholesterol, less than 1.5gm of sodium, & 5 or more servings of fruits and vegetables daily;Weight Loss: Understanding of general recommendations for a balanced deficit meal plan, which promotes 1-2 lb weight loss per week and includes a negative energy balance of [phone] kcal/d;Understanding recommendations for meals to include 15-35% energy as protein, 25-35% energy from fat, 35-60% energy from carbohydrates, less than $RemoveB'200mg'lIEemNWZ$  of dietary cholesterol, 20-35 gm of total fiber daily;Understanding of  distribution of calorie intake throughout the day with the consumption of 4-5 meals/snacks    Hypertension Yes    Intervention Provide education on lifestyle modifcations including regular physical activity/exercise, weight management, moderate sodium restriction and increased consumption of fresh fruit, vegetables, and low fat dairy, alcohol moderation, and smoking cessation.;Monitor prescription use compliance.    Expected Outcomes Short Term: Continued assessment and intervention until BP is < 140/91mm HG in hypertensive participants. < 130/50mm HG in hypertensive participants with diabetes, heart failure or chronic kidney disease.;Long Term: Maintenance of blood pressure at goal levels.    Lipids Yes    Intervention Provide education and support for participant on nutrition & aerobic/resistive exercise along with prescribed medications to achieve LDL '70mg'$ , HDL >$Remo'40mg'RoMdg$ .    Expected Outcomes Short Term: Participant states understanding of desired cholesterol values and is compliant with medications prescribed. Participant is following exercise prescription and nutrition guidelines.;Long Term: Cholesterol controlled with medications as prescribed, with individualized exercise RX and with personalized nutrition plan. Value goals: LDL < $Rem'70mg'nHgp$ , HDL > 40 mg.             Core Components/Risk Factors/Patient Goals Review:   Goals and Risk Factor Review     Row Name 04/10/21 1705 05/09/21 1042 05/30/21 1526         Core Components/Risk Factors/Patient Goals Review   Personal Goals Review Weight Management/Obesity;Stress;Hypertension;Lipids Weight Management/Obesity;Stress;Hypertension;Lipids Weight Management/Obesity;Stress;Hypertension;Lipids     Review Melton has been doing well with exercise. Vital signs have been stable. Patient had a NSTEMI/ Stent placement, Hospital admission at [facility] exercise is currently on hold awaiting clearance from [facility]  has good  participation in [place] since his return to exercisse. Will continue to monitor and make sure Buell follow the recommendations per his podiatrist.     Expected Outcomes Exercise is currently on hold will reassess outcomes once patient is cleared to return to exercise. Exercise is currently on hold will reassess outcomes once patient is cleared to return to exercise. Shabazz will continue to partcipation in phase 2 cardiac rehab for exercise, nutrition and lifestyle modifications              Core Components/Risk Factors/Patient Goals at Discharge (Final Review):   Goals and Risk Factor Review - 05/30/21 1526       Core Components/Risk Factors/Patient Goals Review   Personal Goals Review Weight Management/Obesity;Stress;Hypertension;Lipids    Review Damyon  has good participation in CR since his return to exercisse. Will continue to monitor and make sure [place] follow the recommendations per his podiatrist.    Expected Outcomes Roemello will continue to partcipation in phase 2 cardiac rehab for exercise, nutrition and lifestyle modifications  ITP Comments:  ITP Comments     Row Name 03/19/21 1150 04/10/21 1701 05/09/21 1038 05/30/21 1515     ITP Comments Dr Fransico Him MD, Medical Director 30 Day ITP Review. Rmani has been doing well with exercise at cardiac rehab last session on 04/03/21. Johnathan had a NSTEMI and stent placement while out of town at [facility]. Exercise is currently on hold. 30 Day ITP Review. [place] exercise is currently on hold pending clearance from [facility] 30 Day ITP Review. Timtohy returned to exercise at cardiac rehab on 05/15/21 and has been doing well with exercise. Restrictions noted for Right foot non healing ulcer.             Comments: See ITP comment

## 2021-05-31 ENCOUNTER — Other Ambulatory Visit: Payer: Self-pay

## 2021-05-31 ENCOUNTER — Encounter (HOSPITAL_COMMUNITY)
Admission: RE | Admit: 2021-05-31 | Discharge: 2021-05-31 | Disposition: A | Discharge status: Home / Self Care | Payer: Medicare Other | Source: Ambulatory Visit | Attending: Cardiology | Admitting: Cardiology

## 2021-05-31 DIAGNOSIS — Z955 Presence of coronary angioplasty implant and graft: Secondary | ICD-10-CM

## 2021-05-31 DIAGNOSIS — I214 Non-ST elevation (NSTEMI) myocardial infarction: Secondary | ICD-10-CM

## 2021-05-31 DIAGNOSIS — Z5189 Encounter for other specified aftercare: Secondary | ICD-10-CM | POA: Diagnosis not present

## 2021-06-03 ENCOUNTER — Other Ambulatory Visit: Payer: Self-pay

## 2021-06-03 ENCOUNTER — Encounter (HOSPITAL_COMMUNITY)
Admission: RE | Admit: 2021-06-03 | Discharge: 2021-06-03 | Disposition: A | Discharge status: Home / Self Care | Payer: Medicare Other | Source: Ambulatory Visit | Attending: Cardiology | Admitting: Cardiology

## 2021-06-03 DIAGNOSIS — I214 Non-ST elevation (NSTEMI) myocardial infarction: Secondary | ICD-10-CM

## 2021-06-03 DIAGNOSIS — Z5189 Encounter for other specified aftercare: Secondary | ICD-10-CM | POA: Diagnosis not present

## 2021-06-03 DIAGNOSIS — Z955 Presence of coronary angioplasty implant and graft: Secondary | ICD-10-CM

## 2021-06-05 ENCOUNTER — Encounter (HOSPITAL_COMMUNITY)
Admission: RE | Admit: 2021-06-05 | Discharge: 2021-06-05 | Disposition: A | Discharge status: Home / Self Care | Payer: Medicare Other | Source: Ambulatory Visit | Attending: Cardiology | Admitting: Cardiology

## 2021-06-05 ENCOUNTER — Other Ambulatory Visit: Payer: Self-pay

## 2021-06-05 DIAGNOSIS — Z955 Presence of coronary angioplasty implant and graft: Secondary | ICD-10-CM

## 2021-06-05 DIAGNOSIS — Z5189 Encounter for other specified aftercare: Secondary | ICD-10-CM | POA: Diagnosis not present

## 2021-06-05 DIAGNOSIS — I214 Non-ST elevation (NSTEMI) myocardial infarction: Secondary | ICD-10-CM

## 2021-06-07 ENCOUNTER — Other Ambulatory Visit: Payer: Self-pay

## 2021-06-07 ENCOUNTER — Encounter (HOSPITAL_COMMUNITY)
Admission: RE | Admit: 2021-06-07 | Discharge: 2021-06-07 | Disposition: A | Discharge status: Home / Self Care | Payer: Medicare Other | Source: Ambulatory Visit | Attending: Cardiology | Admitting: Cardiology

## 2021-06-07 DIAGNOSIS — Z955 Presence of coronary angioplasty implant and graft: Secondary | ICD-10-CM

## 2021-06-07 DIAGNOSIS — I214 Non-ST elevation (NSTEMI) myocardial infarction: Secondary | ICD-10-CM

## 2021-06-07 DIAGNOSIS — Z5189 Encounter for other specified aftercare: Secondary | ICD-10-CM | POA: Diagnosis not present

## 2021-06-10 ENCOUNTER — Other Ambulatory Visit: Payer: Self-pay

## 2021-06-10 ENCOUNTER — Encounter (HOSPITAL_COMMUNITY)
Admission: RE | Admit: 2021-06-10 | Discharge: 2021-06-10 | Disposition: A | Discharge status: Home / Self Care | Payer: Medicare Other | Source: Ambulatory Visit | Attending: Cardiology | Admitting: Cardiology

## 2021-06-10 VITALS — Ht 69.5 in | Wt 187.8 lb

## 2021-06-10 DIAGNOSIS — Z955 Presence of coronary angioplasty implant and graft: Secondary | ICD-10-CM

## 2021-06-10 DIAGNOSIS — Z5189 Encounter for other specified aftercare: Secondary | ICD-10-CM | POA: Diagnosis not present

## 2021-06-10 DIAGNOSIS — I214 Non-ST elevation (NSTEMI) myocardial infarction: Secondary | ICD-10-CM

## 2021-06-11 ENCOUNTER — Ambulatory Visit (INDEPENDENT_AMBULATORY_CARE_PROVIDER_SITE_OTHER): Payer: Medicare Other | Admitting: Ophthalmology

## 2021-06-11 ENCOUNTER — Encounter (INDEPENDENT_AMBULATORY_CARE_PROVIDER_SITE_OTHER): Payer: Self-pay | Admitting: Ophthalmology

## 2021-06-11 DIAGNOSIS — E113552 Type 2 diabetes mellitus with stable proliferative diabetic retinopathy, left eye: Secondary | ICD-10-CM | POA: Diagnosis not present

## 2021-06-11 DIAGNOSIS — H2511 Age-related nuclear cataract, right eye: Secondary | ICD-10-CM

## 2021-06-11 DIAGNOSIS — H25042 Posterior subcapsular polar age-related cataract, left eye: Secondary | ICD-10-CM

## 2021-06-11 DIAGNOSIS — H2512 Age-related nuclear cataract, left eye: Secondary | ICD-10-CM

## 2021-06-11 DIAGNOSIS — E113532 Type 2 diabetes mellitus with proliferative diabetic retinopathy with traction retinal detachment not involving the macula, left eye: Secondary | ICD-10-CM

## 2021-06-11 DIAGNOSIS — H35371 Puckering of macula, right eye: Secondary | ICD-10-CM | POA: Diagnosis not present

## 2021-06-11 DIAGNOSIS — E113551 Type 2 diabetes mellitus with stable proliferative diabetic retinopathy, right eye: Secondary | ICD-10-CM

## 2021-06-11 NOTE — Assessment & Plan Note (Signed)
OD with stable acuity over the last 13 years of 20/60 however cataract is present and does require surgical intervention.  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  We will observe the PDR that is now quiet

## 2021-06-11 NOTE — Assessment & Plan Note (Signed)
The nature of regressed proliferative diabetic retinopathy was discussed with the patient. The patient was advised to maintain good glucose, blood pressure, monitor kidney function and serum lipid control as advised by personal physician. Rare risk for reactivation of progression exist with untreated severe anemia, untreated renal failure, untreated heart failure, and smoking. Complete avoidance of smoking was recommended. The chance of recurrent proliferative diabetic retinopathy was discussed as well as the chance of vitreous hemorrhage for which further treatments may be necessary.   Explained to the patient that the quiescent  proliferative diabetic retinopathy disease is unlikely to ever worsen.  Worsening factors would include however severe anemia, hypertension out-of-control or impending renal failure.  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  Nonetheless visualization poor due to due to cataract needs cataract extraction and reevaluation

## 2021-06-11 NOTE — Progress Notes (Signed)
06/11/2021     CHIEF COMPLAINT Patient presents for  Chief Complaint  Patient presents with   Diabetic Eye Exam      HISTORY OF PRESENT ILLNESS: [name] is a 55 y.o. male who presents to the clinic today for:   HPI     Diabetic Eye Exam           Vision: is blurred for near, is blurred for distance and is worsening   Associated Symptoms: Floaters (Pt states that he has "a lot of floaters in my left eye.") and Blind Spot (Pt states, "I have no center of my vision in my left eye but it has been that way for years.").  Negative for Distortion   Diabetes Type: Type 2   Onset: years ago   Blood Sugars: is controlled   Last Blood Glucose: 102   Last A1C: 6.2   Associated Diagnosis: Kidney Disease and Dialysis (Pt states that he is on dialysis M, W and Friday)         Comments   NP- Diabetic Eye Exam- Referred by [facility] Pt states, "I have always not been able to see out of the center of my left eye. I have a lot of issues with that eye but I feel like it is getting worse. I have a big floater. 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  Patient not seen in follow-up thereafter      Last edited by Hurman Horn, MD on 06/11/2021  8:54 AM.      Referring physician: [name], MD [place],  [place] [address]  HISTORICAL INFORMATION:   Selected notes from the MEDICAL RECORD NUMBER    Lab Results  Component Value Date   HGBA1C 7.4 (H) 09/22/2020     CURRENT MEDICATIONS: No current outpatient medications on file. (Ophthalmic Drugs)   No current  facility-administered medications for this visit. (Ophthalmic Drugs)   Current Outpatient Medications (Other)  Medication Sig   amLODipine (NORVASC) 10 MG tablet Take 10 mg by mouth at bedtime.    aspirin EC 81 MG tablet Take 1 tablet (81 mg total) by mouth daily.   blood glucose meter kit and supplies Dispense based on patient and insurance preference. Use up to four times daily as directed. (FOR ICD-10 E10.9, E11.9).   blood glucose meter kit and supplies Dispense based on patient and insurance preference. Use up to four times daily as directed. (FOR ICD-9 250.00, 250.01).   Continuous Blood Gluc Sensor (FREESTYLE LIBRE 14 DAY SENSOR) MISC See admin instructions.   glucose blood (ACCU-CHEK AVIVA PLUS) test strip Use as instructed   hydrALAZINE (APRESOLINE) 50 MG tablet Take 50 mg by mouth 3 (three) times daily.   isosorbide dinitrate (ISORDIL) 20 MG tablet Take 20 mg by mouth 3 (three) times daily.   Lancet Devices (ACCU-CHEK SOFTCLIX) lancets Use as instructed   lidocaine-prilocaine (EMLA) cream Apply 1 application topically as needed. (Patient taking differently: Apply 1 application topically as needed (port access).)   LOKELMA 10 g PACK packet Take 10 g by mouth. Patient  says he takes on HD days   propranolol (INDERAL) 80 MG tablet Take 1 tablet (80 mg total) by mouth 2 (two) times daily.   sevelamer carbonate (RENVELA) 800 MG tablet Take 1,600 mg by mouth 3 (three) times daily with meals.   ticagrelor (BRILINTA) 90 MG TABS tablet TAKE 1 TABLET (90 MG TOTAL) BY MOUTH TWO TIMES DAILY.   No current facility-administered medications for this visit. (Other)      REVIEW OF SYSTEMS:    ALLERGIES Allergies  Allergen Reactions   Atorvastatin     Other reaction(s): Muscle Pain   Rosuvastatin     Other reaction(s): Muscle Pain   Amoxicillin Itching    Did it involve swelling of the face/tongue/throat, SOB, or low BP? Unknown Did it involve sudden or severe rash/hives, skin peeling, or  any reaction on the inside of your mouth or nose? Unknown Did you need to seek medical attention at a hospital or doctor's office? Unknown When did it last happen?      20 years ago If all above answers are "NO", may proceed with cephalosporin use.    Chlorhexidine Hives    Patient has blistering   Hydromorphone Itching    Patient states he may take with benadryl. Makes feet itch.   Percocet [Oxycodone-Acetaminophen] Itching and Other (See Comments)    Legs only. Itching and burning feeling.   Gabapentin Nausea And Vomiting    Stomach issues     PAST MEDICAL HISTORY Past Medical History:  Diagnosis Date   Abscess of right foot    Acute osteomyelitis of right foot ([place]) 05/15/2019   Anemia 06/23/2019   Cellulitis of right foot 05/22/2019   Chest pain 08/23/2013   Chills    at night - sometimes   Complication of anesthesia    Coronary artery disease    Diabetes mellitus    controlled by diet   Diabetes with renal manifestations(250.4) 08/23/2013   Diabetic foot ulcer- right second toe 05/14/2019   Diabetic infection of right foot ([place])    Diabetic retinopathy associated with type 2 diabetes mellitus ([place]) 06/23/2019   Eczema    ESRD on hemodialysis ([place]) 07/18/2011   Essential hypertension 10/18/2007   Qualifier: Diagnosis of  By: Loanne Drilling MD, Sean A    Fatigue    loss of fatigue   FOOT ULCER, LEFT 06/29/2008   Qualifier: Diagnosis of  By: Loanne Drilling MD, Sean A    Gangrene of toe of right foot ([place])    Headache(784.0)    Years ago   Heart murmur    years ago   History of blood transfusion    Hyperglycemia 05/05/2015   HYPERLIPIDEMIA 05/16/2008   Qualifier: Diagnosis of  By: Jenny Reichmann MD, Hunt Oris    Hypoglycemia, unspecified 11/13/2008   Qualifier: Diagnosis of  By: Jenny Reichmann MD, Hunt Oris    MRSA (methicillin resistant staph aureus) culture positive    MRSA INFECTION 10/18/2007   Qualifier: Diagnosis of  By: Marca Ancona RMA, Lucy     Myocardial infarction ([place])    Obesity, unspecified 08/23/2013    Osteomyelitis of great toe of right foot ([place]) 05/14/2019   Other forms of retinal detachment(361.89) 06/23/2019   PONV (postoperative nausea and vomiting)    Poor circulation    Secondary hyperparathyroidism ([place]) 06/23/2019   Sepsis ([place]) 05/14/2019   Type 2 diabetes mellitus ([place]) 08/23/2013   Ulcer of right foot, with necrosis of bone ([place])    Ulcer of right second toe with necrosis of bone (  [place])    Past Surgical History:  Procedure Laterality Date   ABDOMINAL AORTOGRAM N/A 05/17/2019   Procedure: ABDOMINAL AORTOGRAM;  Surgeon: Elam Dutch, MD;  Location: [facility];  Service: Cardiovascular;  Laterality: N/A;   AMPUTATION TOE Right 05/14/2019   Procedure: PARTIAL AMPUTATION SECOND TOE RIGHT FOOT;  Surgeon: Evelina Bucy, DPM;  Location: [facility];  Service: Podiatry;  Laterality: Right;   AMPUTATION TOE Right 05/23/2019   Procedure: AMPUTATION OF SECOND TOE METATARSAL PHALANGEAL JOINT;  Surgeon: Evelina Bucy, DPM;  Location: [facility];  Service: Podiatry;  Laterality: Right;   AMPUTATION TOE Right 05/27/2019   Procedure: Amputation Toe;  Surgeon: Evelina Bucy, DPM;  Location: [facility];  Service: Podiatry;  Laterality: Right;  right third toe   APPLICATION OF WOUND VAC Right 05/27/2019   Procedure: Application Of Wound Vac;  Surgeon: Evelina Bucy, DPM;  Location: [facility];  Service: Podiatry;  Laterality: Right;   AV FISTULA PLACEMENT  06-18-11   Right brachiocephalic AVF   BONE BIOPSY Right 05/14/2019   Procedure: OPEN SUPERFICIAL BONE BIOPSY GREAT TOE;  Surgeon: Evelina Bucy, DPM;  Location: [facility];  Service: Podiatry;  Laterality: Right;   CARDIAC CATHETERIZATION     CORONARY BALLOON ANGIOPLASTY N/A 09/24/2020   Procedure: CORONARY BALLOON ANGIOPLASTY;  Surgeon: Martinique, Peter M, MD;  Location: [facility];  Service: Cardiovascular;  Laterality: N/A;   CORONARY STENT INTERVENTION N/A 09/24/2020   Procedure: CORONARY STENT INTERVENTION;  Surgeon: Martinique, Peter M, MD;   Location: [facility];  Service: Cardiovascular;  Laterality: N/A;   EYE SURGERY     left eye for Laser, diabetic retinopathy   FISTULOGRAM Right 11/06/2011   Procedure: FISTULOGRAM;  Surgeon: Conrad Wahkiakum, MD;  Location: [facility];  Service: Cardiovascular;  Laterality: Right;   HEMATOMA EVACUATION Right Feb. 25, 2014   HEMATOMA EVACUATION Right 12/14/2012   Procedure: EVACUATION HEMATOMA;  Surgeon: Conrad Olivet, MD;  Location: [facility];  Service: Vascular;  Laterality: Right;   INCISION AND DRAINAGE Right 05/23/2019   Procedure: INCISION AND DRAINAGE OF RIGHT FOOT DEEP SPACE ABSCESS;  Surgeon: Evelina Bucy, DPM;  Location: [facility];  Service: Podiatry;  Laterality: Right;   INSERTION OF DIALYSIS CATHETER  12/14/2012   Procedure: INSERTION OF DIALYSIS CATHETER;  Surgeon: Conrad Montrose, MD;  Location: [facility];  Service: Vascular;;   INSERTION OF DIALYSIS CATHETER Right 01/07/2021   Procedure: INSERTION OF DIALYSIS CATHETER;  Surgeon: Elam Dutch, MD;  Location: [facility];  Service: Vascular;  Laterality: Right;   INSERTION OF DIALYSIS CATHETER Left 01/08/2021   Procedure: INSERTION OF LEFT INTERNAL JUGULAR DIALYSIS CATHETER, tunneled;  Surgeon: Angelia Mould, MD;  Location: [facility];  Service: Vascular;  Laterality: Left;   INTRAVASCULAR PRESSURE WIRE/FFR STUDY N/A 09/24/2020   Procedure: INTRAVASCULAR PRESSURE WIRE/FFR STUDY;  Surgeon: Martinique, Peter M, MD;  Location: [facility];  Service: Cardiovascular;  Laterality: N/A;   IRRIGATION AND DEBRIDEMENT FOOT Right 05/25/2019   Procedure: INCISION AND DRAINAGE FOOT;  Surgeon: Evelina Bucy, DPM;  Location: [facility];  Service: Podiatry;  Laterality: Right;   IRRIGATION AND DEBRIDEMENT FOOT Right 05/27/2019   Procedure: IRRIGATION AND DEBRIDEMENT FOOT;  Surgeon: Evelina Bucy, DPM;  Location: [facility];  Service: Podiatry;  Laterality: Right;   LEFT HEART CATH AND CORONARY ANGIOGRAPHY N/A 09/24/2020   Procedure: LEFT HEART CATH AND CORONARY  ANGIOGRAPHY;  Surgeon: Martinique, Peter  M, MD;  Location: [facility];  Service: Cardiovascular;  Laterality: N/A;   LOWER EXTREMITY ANGIOGRAPHY Bilateral 05/17/2019   Procedure: LOWER EXTREMITY ANGIOGRAPHY;  Surgeon: Elam Dutch, MD;  Location: [facility];  Service: Cardiovascular;  Laterality: Bilateral;   REMOVAL OF A DIALYSIS CATHETER Right 01/08/2021   Procedure: REMOVAL OF TEMPORARY DIALYSIS CATHETER, LEFT FEMORAL VEIN;  Surgeon: Angelia Mould, MD;  Location: [facility];  Service: Vascular;  Laterality: Right;   REVISON OF ARTERIOVENOUS FISTULA Right 12/14/2012   Procedure: REVISON OF upper arm ARTERIOVENOUS FISTULA using 64mmx10cm gortex graft;  Surgeon: Conrad , MD;  Location: [facility];  Service: Vascular;  Laterality: Right;   REVISON OF ARTERIOVENOUS FISTULA Right 12/27/2020   Procedure: RIGHT UPPER EXTREMITY ARTERIOVENOUS FISTULA REVISiON;  Surgeon: Cherre Robins, MD;  Location[facility];  Service: Vascular;  Laterality: Right;  PERIPHERAL NERVE BLOCK   RIGHT/LEFT HEART CATH AND CORONARY ANGIOGRAPHY N/A 08/07/2020   Procedure: RIGHT/LEFT HEART CATH AND CORONARY ANGIOGRAPHY;  Surgeon: Martinique, Peter M, MD;  Location: [facility];  Service: Cardiovascular;  Laterality: N/A;   SOFT TISSUE MASS EXCISION     Right arm, Left leg  for MRSA infection    FAMILY HISTORY Family History  Problem Relation Age of Onset   Diabetes Mother     SOCIAL HISTORY Social History   Tobacco Use   Smoking status: Never   Smokeless tobacco: Never  Vaping Use   Vaping Use: Never used  Substance Use Topics   Alcohol use: No   Drug use: No         OPHTHALMIC EXAM:  Base Eye Exam     Visual Acuity (ETDRS)       Right Left   Dist [place] 20/60 -2 CF at 3'   Dist ph [place] 20/40 -1          Tonometry (Tonopen, 8:18 AM)       Right Left   Pressure 12 15         Pupils       Dark Light Shape React APD   Right 3 2 Round Slow Trace   Left 3 3 Round Minimal None          Visual Fields       Left Right   Restrictions Partial outer superior temporal, inferior temporal, superior nasal, inferior nasal deficiencies Partial outer inferior nasal deficiency         Neuro/Psych     Oriented x3: Yes         Dilation     Both eyes: 1.0% Mydriacyl, 2.5% Phenylephrine @ 8:18 AM           Slit Lamp and Fundus Exam     External Exam       Right Left   External Normal Normal         Slit Lamp Exam       Right Left   Lids/Lashes Normal Normal   Conjunctiva/Sclera White and quiet White and quiet   Cornea Clear Clear   Anterior Chamber Deep and quiet Deep and quiet   Iris Round and reactive Round and reactive   Lens 2.5+ Nuclear sclerosis 3.5+ Nuclear sclerosis, 3+ Posterior subcapsular cataract located centrally   Anterior Vitreous Normal Normal         Fundus Exam       Right Left   Posterior Vitreous Normal Essentially no details   Disc Old fibrovascular disease, traction,  vitreal papillary Hazy view, no details   C/D Ratio 0.3 No details   Macula Normal Normal   Vessels Normal Normal   Periphery Normal Good peripheral PRP but no details            IMAGING AND PROCEDURES  Imaging and Procedures for 06/11/21  OCT, Retina - OU - Both Eyes       Right Eye Quality was good. Scan locations included subfoveal. Central Foveal Thickness: 241. Progression has no prior data.   Left Eye Progression has no prior data.   Notes OD some medial opacity yet no active CSME.  Minor epiretinal membrane present not distorting of the fovea  OS dense medial opacity precludes visualization     Color Fundus Photography Optos - OU - Both Eyes       Right Eye Progression has no prior data. Disc findings include neovascularization.   Left Eye Progression has no prior data. Disc findings include neovascularization.   Notes Good PRP PDR quiet, good PRP peripherally OD retina attached.  Old fibrovascular proliferations on  the optic nerve And along the superotemporal arcade and preretinal fibrosis which is not tractional change on the macula  OS dense nuclear sclerotic and posterior subcapsular cataract hampers view posteriorly. May have a peripheral detachment temporally not in the macula as noted in 2008 not well seen PRP peripherally     B-Scan Ultrasound - OS - Left Eye       Quality was good. Findings included extensive retinal detachment.   Notes Retinal detachment superior apparently tractional and chronic as intraretinal cysts noted centered at 12 o'clock position superiorly with anterior retinal tractional detachment at equator and anterior roughly 10-1 o'clock positions, no involvement of posterior pole and no involvement of the macular region             ASSESSMENT/PLAN:  Nuclear sclerotic cataract of right eye I explained to the patient that did be important to have cataract surgery with lens and placed on the right eye prior to subsequent health issues developing in his decades to come.  This would also maximize his visual potential for the remainder of life and allow for medical monitoring as well as surgical intervention the right eye should be required for the residual epiretinal membrane from prior active PDR  Nuclear sclerotic cataract of left eye Diabetic cataract in conjunction with dense PSC cataract likely made worse from diabetic eye disease.  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  The patient was advised to maintain good glucose, blood pressure, monitor kidney function and serum lipid control as advised by personal physician. Rare risk for reactivation of progression exist with untreated severe anemia, untreated renal failure, untreated heart failure, and smoking. Complete avoidance of smoking was recommended. The chance of recurrent proliferative diabetic retinopathy was discussed as well as the chance of vitreous hemorrhage for which further treatments may be necessary.   Explained to the patient that the quiescent  proliferative diabetic retinopathy disease is unlikely to ever worsen.  Worsening factors would include however severe anemia, hypertension out-of-control or impending renal  failure.  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  Nonetheless visualization poor due to due to cataract needs cataract extraction and reevaluation  Stable treated proliferative diabetic retinopathy of right eye determined by examination associated with type 2 diabetes mellitus ([place]) OD with stable acuity over the last 13 years of 20/60 however cataract is present and does require surgical intervention.  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  We will observe the PDR that is now quiet  Right epiretinal membrane Minor epiretinal membrane right eye not impactful vision I do recommend observe the right eye     ICD-10-CM   1. Left eye affected by proliferative diabetic retinopathy with traction retinal detachment not involving macula, associated with type 2 diabetes mellitus ([place])  R42.7062 B-Scan Ultrasound - OS - Left Eye    2. Right epiretinal membrane  H35.371 OCT, Retina - OU - Both Eyes    Color Fundus Photography Optos - OU - Both Eyes    3. Stable treated proliferative diabetic retinopathy of right eye determined by examination associated with type 2 diabetes mellitus ([place])  E11.3551 OCT, Retina - OU - Both Eyes    Color  Fundus Photography Optos - OU - Both Eyes    4. 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 Nuclear sclerotic cataract of right eye  H25.11     6. Nuclear sclerotic cataract of left eye  H25.12     7. Posterior subcapsular age-related cataract, left eye  H25.042       1.  OU overall with quiet PDR.  We will likely observe.  We will need to monitor the left eye after cataract extraction to look for signs of old fibrous tractional retinal detachment noted by my examination in 2008 not visible yet at this time in the left eye.  B-scan ultrasonography OS confirms macula is flat  attached  2.  OU will need cataract extraction intraocular lens placement refer to Dr. Herbert Deaner and/or Dr. Kathlen Mody of [facility] eye care, I recommend OS completed first  3.  Follow-up.  Promptly once cataract surgery is completed and maximum visual potential has occurred so that we can monitor the retinopathy  Ophthalmic Meds Ordered this visit:  No orders of the defined types were placed in this encounter.      Return in about 4 months (around 10/11/2021) for DILATE OU, COLOR FP, OCT.  There are no Patient Instructions on file for this visit.   Explained the diagnoses, plan, and follow up with the patient and they expressed understanding.  Patient expressed understanding of the importance of proper follow up care.   Clent Demark Charolette Bultman M.D. Diseases & Surgery of the Retina and Vitreous Retina & Diabetic [place] 06/11/21     Abbreviations: M myopia (nearsighted); A astigmatism; H hyperopia (farsighted); P presbyopia; Mrx spectacle prescription;  CTL contact lenses; OD right eye; OS left eye; OU both eyes  XT exotropia; ET esotropia; PEK punctate epithelial keratitis; PEE punctate epithelial erosions; DES dry eye syndrome; MGD meibomian gland dysfunction; ATs  artificial tears; PFAT's preservative free artificial tears; [place] nuclear sclerotic cataract; PSC posterior subcapsular cataract; ERM epi-retinal membrane; PVD posterior vitreous detachment; RD retinal detachment; DM diabetes mellitus; DR diabetic retinopathy; NPDR non-proliferative diabetic retinopathy; PDR proliferative diabetic retinopathy; CSME clinically significant macular edema; DME  diabetic macular edema; dbh dot blot hemorrhages; CWS cotton wool spot; POAG primary open angle glaucoma; C/D cup-to-disc ratio; HVF humphrey visual field; GVF goldmann visual field; OCT optical coherence tomography; IOP intraocular pressure; BRVO Branch retinal vein occlusion; CRVO central retinal vein occlusion; CRAO central retinal artery occlusion; BRAO branch retinal artery occlusion; RT retinal tear; SB scleral buckle; PPV pars plana vitrectomy; VH Vitreous hemorrhage; PRP panretinal laser photocoagulation; IVK intravitreal kenalog; VMT vitreomacular traction; MH Macular hole;  NVD neovascularization of the disc; NVE neovascularization elsewhere; AREDS age related eye disease study; ARMD age related macular degeneration; POAG primary open angle glaucoma; EBMD epithelial/anterior basement membrane dystrophy; ACIOL anterior chamber intraocular lens; IOL intraocular lens; PCIOL posterior chamber intraocular lens; Phaco/IOL phacoemulsification with intraocular lens placement; [place] photorefractive keratectomy; LASIK laser assisted in situ keratomileusis; HTN hypertension; DM diabetes mellitus; COPD chronic obstructive pulmonary disease

## 2021-06-11 NOTE — Assessment & Plan Note (Signed)
I explained to the patient that did be important to have cataract surgery with lens and placed on the right eye prior to subsequent health issues developing in his decades to come.  This would also maximize his visual potential for the remainder of life and allow for medical monitoring as well as surgical intervention the right eye should be required for the residual epiretinal membrane from prior active PDR

## 2021-06-11 NOTE — Assessment & Plan Note (Signed)
Diabetic cataract in conjunction with dense PSC cataract likely made worse from diabetic eye disease.  Needs cataract surgery to allow to medical monitoring and evaluation of the posterior pole

## 2021-06-11 NOTE — Assessment & Plan Note (Signed)
Very dense central visual axis, see comments regarding [place] left eye  Will need surgical consultation and likely cataract extraction with lens replacement in the near future

## 2021-06-11 NOTE — Assessment & Plan Note (Signed)
Minor epiretinal membrane right eye not impactful vision I do recommend observe the right eye

## 2021-06-12 ENCOUNTER — Encounter (HOSPITAL_COMMUNITY)
Admission: RE | Admit: 2021-06-12 | Discharge: 2021-06-12 | Disposition: A | Discharge status: Home / Self Care | Payer: Medicare Other | Source: Ambulatory Visit | Attending: Cardiology | Admitting: Cardiology

## 2021-06-12 ENCOUNTER — Encounter (INDEPENDENT_AMBULATORY_CARE_PROVIDER_SITE_OTHER): Payer: Self-pay

## 2021-06-12 ENCOUNTER — Other Ambulatory Visit: Payer: Self-pay

## 2021-06-12 DIAGNOSIS — Z955 Presence of coronary angioplasty implant and graft: Secondary | ICD-10-CM

## 2021-06-12 DIAGNOSIS — I214 Non-ST elevation (NSTEMI) myocardial infarction: Secondary | ICD-10-CM

## 2021-06-12 DIAGNOSIS — Z5189 Encounter for other specified aftercare: Secondary | ICD-10-CM | POA: Diagnosis not present

## 2021-06-13 DIAGNOSIS — E1159 Type 2 diabetes mellitus with other circulatory complications: Secondary | ICD-10-CM | POA: Diagnosis not present

## 2021-06-13 DIAGNOSIS — Z89431 Acquired absence of right foot: Secondary | ICD-10-CM | POA: Diagnosis not present

## 2021-06-13 DIAGNOSIS — N186 End stage renal disease: Secondary | ICD-10-CM | POA: Diagnosis not present

## 2021-06-13 DIAGNOSIS — L97511 Non-pressure chronic ulcer of other part of right foot limited to breakdown of skin: Secondary | ICD-10-CM | POA: Diagnosis not present

## 2021-06-14 ENCOUNTER — Encounter (HOSPITAL_COMMUNITY): Payer: Medicare Other

## 2021-06-17 ENCOUNTER — Other Ambulatory Visit: Payer: Self-pay

## 2021-06-17 ENCOUNTER — Encounter (HOSPITAL_COMMUNITY)
Admission: RE | Admit: 2021-06-17 | Discharge: 2021-06-17 | Disposition: A | Discharge status: Home / Self Care | Payer: Medicare Other | Source: Ambulatory Visit | Attending: Cardiology | Admitting: Cardiology

## 2021-06-17 DIAGNOSIS — Z5189 Encounter for other specified aftercare: Secondary | ICD-10-CM | POA: Diagnosis not present

## 2021-06-17 DIAGNOSIS — Z955 Presence of coronary angioplasty implant and graft: Secondary | ICD-10-CM

## 2021-06-17 DIAGNOSIS — I214 Non-ST elevation (NSTEMI) myocardial infarction: Secondary | ICD-10-CM

## 2021-06-19 ENCOUNTER — Encounter (HOSPITAL_COMMUNITY)
Admission: RE | Admit: 2021-06-19 | Discharge: 2021-06-19 | Disposition: A | Discharge status: Home / Self Care | Payer: Medicare Other | Source: Ambulatory Visit | Attending: Cardiology | Admitting: Cardiology

## 2021-06-19 ENCOUNTER — Other Ambulatory Visit: Payer: Self-pay

## 2021-06-19 DIAGNOSIS — Z5189 Encounter for other specified aftercare: Secondary | ICD-10-CM | POA: Diagnosis not present

## 2021-06-19 DIAGNOSIS — I214 Non-ST elevation (NSTEMI) myocardial infarction: Secondary | ICD-10-CM

## 2021-06-19 DIAGNOSIS — Z955 Presence of coronary angioplasty implant and graft: Secondary | ICD-10-CM

## 2021-06-20 DIAGNOSIS — N186 End stage renal disease: Secondary | ICD-10-CM | POA: Diagnosis not present

## 2021-06-20 DIAGNOSIS — Z992 Dependence on renal dialysis: Secondary | ICD-10-CM | POA: Diagnosis not present

## 2021-06-20 DIAGNOSIS — E1129 Type 2 diabetes mellitus with other diabetic kidney complication: Secondary | ICD-10-CM | POA: Diagnosis not present

## 2021-06-21 ENCOUNTER — Encounter (HOSPITAL_COMMUNITY)
Admission: RE | Admit: 2021-06-21 | Discharge: 2021-06-21 | Disposition: A | Discharge status: Home / Self Care | Payer: Medicare Other | Source: Ambulatory Visit | Attending: Cardiology | Admitting: Cardiology

## 2021-06-21 ENCOUNTER — Other Ambulatory Visit: Payer: Self-pay

## 2021-06-21 DIAGNOSIS — Z955 Presence of coronary angioplasty implant and graft: Secondary | ICD-10-CM | POA: Diagnosis not present

## 2021-06-21 DIAGNOSIS — D509 Iron deficiency anemia, unspecified: Secondary | ICD-10-CM | POA: Diagnosis not present

## 2021-06-21 DIAGNOSIS — Z992 Dependence on renal dialysis: Secondary | ICD-10-CM | POA: Diagnosis not present

## 2021-06-21 DIAGNOSIS — N186 End stage renal disease: Secondary | ICD-10-CM | POA: Diagnosis not present

## 2021-06-21 DIAGNOSIS — N2581 Secondary hyperparathyroidism of renal origin: Secondary | ICD-10-CM | POA: Diagnosis not present

## 2021-06-21 DIAGNOSIS — I214 Non-ST elevation (NSTEMI) myocardial infarction: Secondary | ICD-10-CM | POA: Insufficient documentation

## 2021-06-21 DIAGNOSIS — D631 Anemia in chronic kidney disease: Secondary | ICD-10-CM | POA: Diagnosis not present

## 2021-06-25 DIAGNOSIS — N186 End stage renal disease: Secondary | ICD-10-CM | POA: Diagnosis not present

## 2021-06-25 DIAGNOSIS — I10 Essential (primary) hypertension: Secondary | ICD-10-CM | POA: Diagnosis not present

## 2021-06-25 DIAGNOSIS — E1122 Type 2 diabetes mellitus with diabetic chronic kidney disease: Secondary | ICD-10-CM | POA: Diagnosis not present

## 2021-06-25 DIAGNOSIS — I2510 Atherosclerotic heart disease of native coronary artery without angina pectoris: Secondary | ICD-10-CM | POA: Diagnosis not present

## 2021-06-25 DIAGNOSIS — G4700 Insomnia, unspecified: Secondary | ICD-10-CM | POA: Diagnosis not present

## 2021-06-27 DIAGNOSIS — E1159 Type 2 diabetes mellitus with other circulatory complications: Secondary | ICD-10-CM | POA: Diagnosis not present

## 2021-06-27 DIAGNOSIS — Z89431 Acquired absence of right foot: Secondary | ICD-10-CM | POA: Diagnosis not present

## 2021-07-02 DIAGNOSIS — N186 End stage renal disease: Secondary | ICD-10-CM | POA: Diagnosis not present

## 2021-07-02 DIAGNOSIS — N2581 Secondary hyperparathyroidism of renal origin: Secondary | ICD-10-CM | POA: Diagnosis not present

## 2021-07-02 DIAGNOSIS — Z992 Dependence on renal dialysis: Secondary | ICD-10-CM | POA: Diagnosis not present

## 2021-07-02 DIAGNOSIS — D500 Iron deficiency anemia secondary to blood loss (chronic): Secondary | ICD-10-CM | POA: Diagnosis not present

## 2021-07-04 DIAGNOSIS — Z992 Dependence on renal dialysis: Secondary | ICD-10-CM | POA: Diagnosis not present

## 2021-07-04 DIAGNOSIS — N186 End stage renal disease: Secondary | ICD-10-CM | POA: Diagnosis not present

## 2021-07-04 DIAGNOSIS — D500 Iron deficiency anemia secondary to blood loss (chronic): Secondary | ICD-10-CM | POA: Diagnosis not present

## 2021-07-04 DIAGNOSIS — N2581 Secondary hyperparathyroidism of renal origin: Secondary | ICD-10-CM | POA: Diagnosis not present

## 2021-07-04 NOTE — Progress Notes (Signed)
Discharge Progress Report  Patient Details  Name: [name] MRN: [id number] Date of Birth: May 23, 1966 Referring Provider:   Flowsheet Row CARDIAC REHAB PHASE II ORIENTATION from 03/19/2021 in [place]  Referring Provider Dr. Redmond Pulling, MD (Covering)        Number of Visits: 18  Reason for Discharge:  Patient reached a stable level of exercise. Patient has met program and personal goals.  Smoking History:  Social History   Tobacco Use  Smoking Status Never  Smokeless Tobacco Never    Diagnosis:   09/21/20 NSTEMI    09/24/21 S/P DES OM2,  S/P PCI/ DES D2 at [place]:   Initial Exercise Prescription:  Initial Exercise Prescription - 03/19/21 1300       Date of Initial Exercise RX and Referring Provider   Date 03/19/21    Referring Provider Dr. Redmond Pulling, MD (Covering)    Expected Discharge Date 05/17/21      Recumbant Bike   Level 2    RPM 60    Minutes 15    METs 2.2      NuStep   Level 3    SPM 75    Minutes 15    METs 2.2      Prescription Details   Frequency (times per week) 3    Duration Progress to 30 minutes of continuous aerobic without signs/symptoms of physical distress      Intensity   THRR 40-80% of Max Heartrate 66-132    Ratings of Perceived Exertion 11-13    Perceived Dyspnea 0-4      Progression   Progression Continue progressive overload as per policy without signs/symptoms or physical distress.      Resistance Training   Training Prescription Yes    Weight 3 lbs    Reps 10-15             Discharge Exercise Prescription (Final Exercise Prescription Changes):  Exercise Prescription Changes - 06/21/21 1642       Response to Exercise   Blood Pressure (Admit) 108/60    Blood Pressure (Exercise) 120/68    Blood Pressure (Exit) 110/62    Heart Rate (Admit) 84 bpm    Heart Rate (Exercise) 92 bpm    Heart Rate (Exit) 81 bpm    Rating of  Perceived Exertion (Exercise) 12    Perceived Dyspnea (Exercise) 0    Symptoms None    Comments Pt graduated the CRP2 program    Duration Progress to 30 minutes of  aerobic without signs/symptoms of physical distress    Intensity THRR unchanged      Progression   Progression Continue to progress workloads to maintain intensity without signs/symptoms of physical distress.    Average METs 2.6      Resistance Training   Training Prescription Yes    Weight 3 lbs    Reps 10-15    Time 10 Minutes      Interval Training   Interval Training No      NuStep   Level 5    SPM 100    Minutes 30    METs 2.6      Home Exercise Plan   Plans to continue exercise at Home (comment)    Frequency Add 2 additional days to program exercise sessions.    Initial Home Exercises Provided 05/20/21             Functional Capacity:  6 Minute Walk  [place] Name 03/19/21 1246 06/10/21 1635       6 Minute Walk   Phase Initial Discharge  stepper test    Distance 1378 feet  performed on Nustep 1903 feet    Distance % Change -- 38.2 %    Distance Feet Change -- 526 ft    Walk Time 6 minutes 6 minutes    # of Rest Breaks 0 0    MPH 2.61 3.6    METS 3.63 4.69    RPE 7 10    Perceived Dyspnea  0 0    VO2 Peak 12.71 16.43    Symptoms No No    Resting HR 71 bpm 66 bpm    Resting BP 140/80 112/70    Resting Oxygen Saturation  99 % 97 %    Exercise Oxygen Saturation  during 6 min walk 98 % 97 %    Max Ex. HR 75 bpm 77 bpm    Max Ex. BP 138/76 148/68    2 Minute Post BP 122/74 108/60             Psychological, QOL, Others - Outcomes: PHQ 2/9: Depression screen [facility] 2/9 06/21/2021 03/19/2021 05/10/2015  Decreased Interest 0 0 0  Down, Depressed, Hopeless 0 0 0  PHQ - 2 Score 0 0 0  Altered sleeping 1 - -  Tired, decreased energy 0 - -  Change in appetite 0 - -  Feeling bad or failure about yourself  0 - -  Trouble concentrating 0 - -  Moving slowly or fidgety/restless 0 - -  Suicidal  thoughts 0 - -  PHQ-9 Score 1 - -  Difficult doing work/chores Not difficult at all - -  Some recent data might be hidden    Quality of Life:  Quality of Life - 06/12/21 1622       Quality of Life   Select Quality of Life      Quality of Life Scores   Health/Function Post 24.8 %    Socioeconomic Post 24 %    Psych/Spiritual Post 24 %    Family Post 24 %    GLOBAL Post 23.91 %             Personal Goals: Goals established at orientation with interventions provided to work toward goal.  Personal Goals and Risk Factors at Admission - 03/19/21 1309       Core Components/Risk Factors/Patient Goals on Admission    Weight Management Yes;Weight Loss    Intervention Weight Management: Develop a combined nutrition and exercise program designed to reach desired caloric intake, while maintaining appropriate intake of nutrient and fiber, sodium and fats, and appropriate energy expenditure required for the weight goal.;Weight Management: Provide education and appropriate resources to help participant work on and attain dietary goals.;Weight Management/Obesity: Establish reasonable short term and long term weight goals.    Admit Weight 195 lb 8.8 oz (88.7 kg)    Expected Outcomes Short Term: Continue to assess and modify interventions until short term weight is achieved;Long Term: Adherence to nutrition and physical activity/exercise program aimed toward attainment of established weight goal;Weight Maintenance: Understanding of the daily nutrition guidelines, which includes 25-35% calories from fat, 7% or less cal from saturated fats, less than 25m cholesterol, less than 1.5gm of sodium, & 5 or more servings of fruits and vegetables daily;Weight Loss: Understanding of general recommendations for a balanced deficit meal plan, which promotes 1-2 lb weight loss per week and includes a negative energy balance  of [phone] kcal/d;Understanding recommendations for meals to include 15-35% energy as  protein, 25-35% energy from fat, 35-60% energy from carbohydrates, less than 286m of dietary cholesterol, 20-35 gm of total fiber daily;Understanding of distribution of calorie intake throughout the day with the consumption of 4-5 meals/snacks    Hypertension Yes    Intervention Provide education on lifestyle modifcations including regular physical activity/exercise, weight management, moderate sodium restriction and increased consumption of fresh fruit, vegetables, and low fat dairy, alcohol moderation, and smoking cessation.;Monitor prescription use compliance.    Expected Outcomes Short Term: Continued assessment and intervention until BP is < 140/982mHG in hypertensive participants. < 130/8043mG in hypertensive participants with diabetes, heart failure or chronic kidney disease.;Long Term: Maintenance of blood pressure at goal levels.    Lipids Yes    Intervention Provide education and support for participant on nutrition & aerobic/resistive exercise along with prescribed medications to achieve LDL <48m58mDL >40mg2m Expected Outcomes Short Term: Participant states understanding of desired cholesterol values and is compliant with medications prescribed. Participant is following exercise prescription and nutrition guidelines.;Long Term: Cholesterol controlled with medications as prescribed, with individualized exercise RX and with personalized nutrition plan. Value goals: LDL < 48mg,72m > 40 mg.              Personal Goals Discharge:  Goals and Risk Factor Review     Row Name 04/10/21 1705 05/09/21 1042 05/30/21 1526 06/25/21 0926       Core Components/Risk Factors/Patient Goals Review   Personal Goals Review Weight Management/Obesity;Stress;Hypertension;Lipids Weight Management/Obesity;Stress;Hypertension;Lipids Weight Management/Obesity;Stress;Hypertension;Lipids Weight Management/Obesity;Hypertension;Lipids    Review Garrett Gurshaneen doing well with exercise. Vital signs have been  stable. Patient had a NSTEMI/ Stent placement, [facility] his return to exercisse. Will continue to monitor and make sure Adynn Cowenw the recommendations per his podiatrist. Doyel Jeneen Rinksates today with the completion of 18 sessions with fair attendance. Absences due ot his non healing wound to his foot as he has close follow up with his podiatrist.  Dyon Zoltanmliant with his dialyis schedule and maintains acceptable vital signs for  readings regarding his history of kidney disease. Deanta Coaltonstands the importance of following heart heatlhy diet with dietary restrictions for kidney disease this is reflected in his most recent lipid panel reading.    Expected Outcomes Exercise is currently on hold will reassess outcomes once patient is cleared to return to exercise. Exercise is currently on hold will reassess outcomes once patient is cleared to return to exercise. Philopater Junecontinue to partcipation in phase 2 cardiac rehab for exercise, nutrition and lifestyle modifications Heron Johnstoncontinue adoption of heart heatlhy lifestyle through nutrition and exercise with modifications due to his foot wound.             Exercise Goals and Review:  Exercise Goals     Row Name 03/19/21 1229             Exercise Goals   Increase Physical Activity Yes       Intervention Provide advice, education, support and counseling about physical activity/exercise needs.;Develop an individualized exercise prescription for aerobic and resistive training based on initial evaluation findings, risk stratification, comorbidities and participant's personal goals.       Expected Outcomes Short Term: Attend rehab on a regular basis to increase amount of physical activity.;Long Term: Add in home  exercise to make exercise part of routine and to increase amount of physical activity.;Long  Term: Exercising regularly at least 3-5 days a week.       Increase Strength and Stamina Yes       Intervention Provide advice, education, support and counseling about physical activity/exercise needs.;Develop an individualized exercise prescription for aerobic and resistive training based on initial evaluation findings, risk stratification, comorbidities and participant's personal goals.       Expected Outcomes Short Term: Increase workloads from initial exercise prescription for resistance, speed, and METs.;Short Term: Perform resistance training exercises routinely during rehab and add in resistance training at home;Long Term: Improve cardiorespiratory fitness, muscular endurance and strength as measured by increased METs and functional capacity (6MWT)       Able to understand and use rate of perceived exertion (RPE) scale Yes       Intervention Provide education and explanation on how to use RPE scale       Expected Outcomes Short Term: Able to use RPE daily in rehab to express subjective intensity level;Long Term:  Able to use RPE to guide intensity level when exercising independently       Knowledge and understanding of Target Heart Rate Range (THRR) Yes       Intervention Provide education and explanation of THRR including how the numbers were predicted and where they are located for reference       Expected Outcomes Short Term: Able to state/look up THRR;Short Term: Able to use daily as guideline for intensity in rehab;Long Term: Able to use THRR to govern intensity when exercising independently       Understanding of Exercise Prescription Yes       Intervention Provide education, explanation, and written materials on patient's individual exercise prescription       Expected Outcomes Short Term: Able to explain program exercise prescription;Long Term: Able to explain home exercise prescription to exercise independently                Exercise Goals Re-Evaluation:  Exercise Goals  Re-Evaluation     Row Name 03/25/21 1625 05/20/21 1625 06/21/21 1644         Exercise Goal Re-Evaluation   Exercise Goals Review Increase Physical Activity;Increase Strength and Stamina;Able to understand and use rate of perceived exertion (RPE) scale;Knowledge and understanding of Target Heart Rate Range (THRR);Understanding of Exercise Prescription Increase Physical Activity;Increase Strength and Stamina;Able to understand and use rate of perceived exertion (RPE) scale;Knowledge and understanding of Target Heart Rate Range (THRR);Understanding of Exercise Prescription Increase Physical Activity;Increase Strength and Stamina;Able to understand and use rate of perceived exertion (RPE) scale;Knowledge and understanding of Target Heart Rate Range (THRR);Understanding of Exercise Prescription     Comments Pt's first day in the CRP2 program. Pt understnads the Exercise Rx, THRR, and RPE scale. Reviewed MET's, goals and home exercise with pt today. Pt is tolerating exercise well with an average MET level of 2.0. Pt states he has a sore on his foot that tends to ooze if he over exerts himself with weight bearing exercises. Offered pt a printout with 3 chair fitness youtube courses, he states he can do those sessions 2 times a week for 30 minutes. Pt states he feels like he still needs time to reach his goals, but is doing well with exercise so far. Pt graduated the [place] program today. Pt progressed well in the program with an average MET level of 2.6. Pt will continue to exercise on his own at home  by doing youtube chair fitness classes and walking (contingent on his foot sore) 5-7 days for 15-30 minutes until his foot has time to heal     Expected Outcomes Will continue to monior patient and advance exercise workloads as tolerated. Pt will add in 2 days of chair fitness courses that will last about 30 minutes. Will continue to monitor pt and progress workloads as tolerated without sign or symptom. Pt will  continue to exercise on his own at home              Nutrition & Weight - Outcomes:  Pre Biometrics - 03/19/21 1023       Pre Biometrics   Waist Circumference 42 inches    Hip Circumference 40.5 inches    Waist to Hip Ratio 1.04 %    Triceps Skinfold 10 mm    % Body Fat 26.6 %    Grip Strength 36 kg    Flexibility 13.5 in    Single Leg Stand 2.32 seconds             Post Biometrics - 06/10/21 1638        Post  Biometrics   Height 5' 9.5" (1.765 m)    Weight 85.2 kg    Waist Circumference 39 inches    Hip Circumference 39.5 inches    Waist to Hip Ratio 0.99 %    BMI (Calculated) 27.35    Triceps Skinfold 10 mm    % Body Fat 24.7 %    Grip Strength 31 kg    Flexibility 13 in    Single Leg Stand 1.7 seconds             Nutrition:  Nutrition Therapy & Goals - 04/04/21 1006       Nutrition Therapy   Diet TLC; reduced phos and potassium      Personal Nutrition Goals   Nutrition Goal Pt to build a healthy plate including vegetables, fruits, whole grains, and low-fat dairy products in a heart healthy meal plan.    Personal Goal #2 Pt to continue follow instructions from renal dietitian to modify phos and potassium intake      Intervention Plan   Intervention Prescribe, educate and counsel regarding individualized specific dietary modifications aiming towards targeted core components such as weight, hypertension, lipid management, diabetes, heart failure and other comorbidities.;Nutrition handout(s) given to patient.    Expected Outcomes Short Term Goal: Understand basic principles of dietary content, such as calories, fat, sodium, cholesterol and nutrients.;Long Term Goal: Adherence to prescribed nutrition plan.             Nutrition Discharge:   Education Questionnaire Score:  Knowledge Questionnaire Score - 06/12/21 1618       Knowledge Questionnaire Score   Post Score 17/24             Goals reviewed with patient; copy given to  patient.Pt graduated from cardiac rehab program on 06/21/21  with completion of 18 exercise sessions in Phase II. Pt maintained good attendance and progressed nicely during his participation in rehab as evidenced by increased MET level.   Medication list reconciled. Repeat  PHQ score-0  .  Pt has made significant lifestyle changes and should be commended for his success. Pt feels he has achieved his goals during cardiac rehab.   Pt plans to continue exercise in cardiac maintenance program. Kaiyu increased his distance on his post 6 minute nustep test by 526 feet. Jamiah plans to do chair exercises and  will continue to follow any restrictions per his podiatrist for his non healing foot ulcer.Harrell Gave RN BSN

## 2021-07-11 DIAGNOSIS — I2510 Atherosclerotic heart disease of native coronary artery without angina pectoris: Secondary | ICD-10-CM | POA: Diagnosis not present

## 2021-07-11 DIAGNOSIS — N186 End stage renal disease: Secondary | ICD-10-CM | POA: Diagnosis not present

## 2021-07-11 DIAGNOSIS — I10 Essential (primary) hypertension: Secondary | ICD-10-CM | POA: Diagnosis not present

## 2021-07-11 DIAGNOSIS — E1122 Type 2 diabetes mellitus with diabetic chronic kidney disease: Secondary | ICD-10-CM | POA: Diagnosis not present

## 2021-07-11 DIAGNOSIS — G4700 Insomnia, unspecified: Secondary | ICD-10-CM | POA: Diagnosis not present

## 2021-07-18 DIAGNOSIS — E1122 Type 2 diabetes mellitus with diabetic chronic kidney disease: Secondary | ICD-10-CM | POA: Diagnosis not present

## 2021-07-18 DIAGNOSIS — I10 Essential (primary) hypertension: Secondary | ICD-10-CM | POA: Diagnosis not present

## 2021-07-18 DIAGNOSIS — G4700 Insomnia, unspecified: Secondary | ICD-10-CM | POA: Diagnosis not present

## 2021-07-20 DIAGNOSIS — N186 End stage renal disease: Secondary | ICD-10-CM | POA: Diagnosis not present

## 2021-07-20 DIAGNOSIS — Z992 Dependence on renal dialysis: Secondary | ICD-10-CM | POA: Diagnosis not present

## 2021-07-20 DIAGNOSIS — E1129 Type 2 diabetes mellitus with other diabetic kidney complication: Secondary | ICD-10-CM | POA: Diagnosis not present

## 2021-07-22 DIAGNOSIS — D509 Iron deficiency anemia, unspecified: Secondary | ICD-10-CM | POA: Diagnosis not present

## 2021-07-22 DIAGNOSIS — Z992 Dependence on renal dialysis: Secondary | ICD-10-CM | POA: Diagnosis not present

## 2021-07-22 DIAGNOSIS — H2589 Other age-related cataract: Secondary | ICD-10-CM | POA: Diagnosis not present

## 2021-07-22 DIAGNOSIS — H25013 Cortical age-related cataract, bilateral: Secondary | ICD-10-CM | POA: Diagnosis not present

## 2021-07-22 DIAGNOSIS — H2512 Age-related nuclear cataract, left eye: Secondary | ICD-10-CM | POA: Diagnosis not present

## 2021-07-22 DIAGNOSIS — N2581 Secondary hyperparathyroidism of renal origin: Secondary | ICD-10-CM | POA: Diagnosis not present

## 2021-07-22 DIAGNOSIS — D631 Anemia in chronic kidney disease: Secondary | ICD-10-CM | POA: Diagnosis not present

## 2021-07-22 DIAGNOSIS — H52213 Irregular astigmatism, bilateral: Secondary | ICD-10-CM | POA: Diagnosis not present

## 2021-07-22 DIAGNOSIS — H2513 Age-related nuclear cataract, bilateral: Secondary | ICD-10-CM | POA: Diagnosis not present

## 2021-07-22 DIAGNOSIS — N186 End stage renal disease: Secondary | ICD-10-CM | POA: Diagnosis not present

## 2021-07-22 DIAGNOSIS — H25042 Posterior subcapsular polar age-related cataract, left eye: Secondary | ICD-10-CM | POA: Diagnosis not present

## 2021-07-22 DIAGNOSIS — H40013 Open angle with borderline findings, low risk, bilateral: Secondary | ICD-10-CM | POA: Diagnosis not present

## 2021-07-24 DIAGNOSIS — E1129 Type 2 diabetes mellitus with other diabetic kidney complication: Secondary | ICD-10-CM | POA: Diagnosis not present

## 2021-07-25 DIAGNOSIS — L97511 Non-pressure chronic ulcer of other part of right foot limited to breakdown of skin: Secondary | ICD-10-CM | POA: Diagnosis not present

## 2021-07-25 DIAGNOSIS — Z89431 Acquired absence of right foot: Secondary | ICD-10-CM | POA: Diagnosis not present

## 2021-07-25 DIAGNOSIS — E1159 Type 2 diabetes mellitus with other circulatory complications: Secondary | ICD-10-CM | POA: Diagnosis not present

## 2021-08-06 DIAGNOSIS — I10 Essential (primary) hypertension: Secondary | ICD-10-CM | POA: Diagnosis not present

## 2021-08-06 DIAGNOSIS — H2512 Age-related nuclear cataract, left eye: Secondary | ICD-10-CM | POA: Diagnosis not present

## 2021-08-06 DIAGNOSIS — E782 Mixed hyperlipidemia: Secondary | ICD-10-CM | POA: Diagnosis not present

## 2021-08-06 DIAGNOSIS — I2510 Atherosclerotic heart disease of native coronary artery without angina pectoris: Secondary | ICD-10-CM | POA: Diagnosis not present

## 2021-08-06 DIAGNOSIS — I071 Rheumatic tricuspid insufficiency: Secondary | ICD-10-CM | POA: Diagnosis not present

## 2021-08-06 DIAGNOSIS — H25812 Combined forms of age-related cataract, left eye: Secondary | ICD-10-CM | POA: Diagnosis not present

## 2021-08-06 DIAGNOSIS — I739 Peripheral vascular disease, unspecified: Secondary | ICD-10-CM | POA: Diagnosis not present

## 2021-08-06 DIAGNOSIS — Z789 Other specified health status: Secondary | ICD-10-CM | POA: Diagnosis not present

## 2021-08-06 DIAGNOSIS — H25042 Posterior subcapsular polar age-related cataract, left eye: Secondary | ICD-10-CM | POA: Diagnosis not present

## 2021-08-08 DIAGNOSIS — S90822A Blister (nonthermal), left foot, initial encounter: Secondary | ICD-10-CM | POA: Diagnosis not present

## 2021-08-08 DIAGNOSIS — Z89431 Acquired absence of right foot: Secondary | ICD-10-CM | POA: Diagnosis not present

## 2021-08-08 DIAGNOSIS — E1159 Type 2 diabetes mellitus with other circulatory complications: Secondary | ICD-10-CM | POA: Diagnosis not present

## 2021-08-08 DIAGNOSIS — N186 End stage renal disease: Secondary | ICD-10-CM | POA: Diagnosis not present

## 2021-08-12 ENCOUNTER — Encounter (INDEPENDENT_AMBULATORY_CARE_PROVIDER_SITE_OTHER): Payer: Self-pay

## 2021-08-20 ENCOUNTER — Emergency Department: Payer: Medicare Other

## 2021-08-20 ENCOUNTER — Other Ambulatory Visit: Payer: Self-pay

## 2021-08-20 ENCOUNTER — Inpatient Hospital Stay
Admission: EM | Admit: 2021-08-20 | Discharge: 2021-08-21 | DRG: 302 | Disposition: A | Discharge status: Other Acute Inpatient Hospital | Payer: Medicare Other | Attending: Internal Medicine | Admitting: Internal Medicine

## 2021-08-20 DIAGNOSIS — I25110 Atherosclerotic heart disease of native coronary artery with unstable angina pectoris: Principal | ICD-10-CM | POA: Diagnosis present

## 2021-08-20 DIAGNOSIS — Z79899 Other long term (current) drug therapy: Secondary | ICD-10-CM

## 2021-08-20 DIAGNOSIS — E039 Hypothyroidism, unspecified: Secondary | ICD-10-CM | POA: Diagnosis present

## 2021-08-20 DIAGNOSIS — Z833 Family history of diabetes mellitus: Secondary | ICD-10-CM | POA: Diagnosis not present

## 2021-08-20 DIAGNOSIS — Z992 Dependence on renal dialysis: Secondary | ICD-10-CM | POA: Diagnosis not present

## 2021-08-20 DIAGNOSIS — E1129 Type 2 diabetes mellitus with other diabetic kidney complication: Secondary | ICD-10-CM

## 2021-08-20 DIAGNOSIS — N186 End stage renal disease: Secondary | ICD-10-CM | POA: Diagnosis not present

## 2021-08-20 DIAGNOSIS — Z880 Allergy status to penicillin: Secondary | ICD-10-CM

## 2021-08-20 DIAGNOSIS — E119 Type 2 diabetes mellitus without complications: Secondary | ICD-10-CM

## 2021-08-20 DIAGNOSIS — E7800 Pure hypercholesterolemia, unspecified: Secondary | ICD-10-CM | POA: Diagnosis not present

## 2021-08-20 DIAGNOSIS — Z20822 Contact with and (suspected) exposure to covid-19: Secondary | ICD-10-CM | POA: Diagnosis not present

## 2021-08-20 DIAGNOSIS — I252 Old myocardial infarction: Secondary | ICD-10-CM

## 2021-08-20 DIAGNOSIS — I200 Unstable angina: Secondary | ICD-10-CM | POA: Diagnosis present

## 2021-08-20 DIAGNOSIS — Z7982 Long term (current) use of aspirin: Secondary | ICD-10-CM | POA: Diagnosis not present

## 2021-08-20 DIAGNOSIS — I120 Hypertensive chronic kidney disease with stage 5 chronic kidney disease or end stage renal disease: Secondary | ICD-10-CM | POA: Diagnosis present

## 2021-08-20 DIAGNOSIS — Z8614 Personal history of Methicillin resistant Staphylococcus aureus infection: Secondary | ICD-10-CM | POA: Diagnosis not present

## 2021-08-20 DIAGNOSIS — N2581 Secondary hyperparathyroidism of renal origin: Secondary | ICD-10-CM | POA: Diagnosis not present

## 2021-08-20 DIAGNOSIS — Z5329 Procedure and treatment not carried out because of patient's decision for other reasons: Secondary | ICD-10-CM | POA: Diagnosis not present

## 2021-08-20 DIAGNOSIS — D649 Anemia, unspecified: Secondary | ICD-10-CM | POA: Diagnosis present

## 2021-08-20 DIAGNOSIS — D631 Anemia in chronic kidney disease: Secondary | ICD-10-CM | POA: Diagnosis not present

## 2021-08-20 DIAGNOSIS — Z885 Allergy status to narcotic agent status: Secondary | ICD-10-CM | POA: Diagnosis not present

## 2021-08-20 DIAGNOSIS — R079 Chest pain, unspecified: Secondary | ICD-10-CM | POA: Diagnosis not present

## 2021-08-20 DIAGNOSIS — E1122 Type 2 diabetes mellitus with diabetic chronic kidney disease: Secondary | ICD-10-CM | POA: Diagnosis present

## 2021-08-20 DIAGNOSIS — Z955 Presence of coronary angioplasty implant and graft: Secondary | ICD-10-CM

## 2021-08-20 DIAGNOSIS — I2510 Atherosclerotic heart disease of native coronary artery without angina pectoris: Secondary | ICD-10-CM | POA: Diagnosis present

## 2021-08-20 DIAGNOSIS — E782 Mixed hyperlipidemia: Secondary | ICD-10-CM | POA: Diagnosis not present

## 2021-08-20 DIAGNOSIS — I071 Rheumatic tricuspid insufficiency: Secondary | ICD-10-CM | POA: Diagnosis not present

## 2021-08-20 DIAGNOSIS — Z7902 Long term (current) use of antithrombotics/antiplatelets: Secondary | ICD-10-CM | POA: Diagnosis not present

## 2021-08-20 DIAGNOSIS — I10 Essential (primary) hypertension: Secondary | ICD-10-CM | POA: Diagnosis not present

## 2021-08-20 DIAGNOSIS — Z888 Allergy status to other drugs, medicaments and biological substances status: Secondary | ICD-10-CM

## 2021-08-20 DIAGNOSIS — Z789 Other specified health status: Secondary | ICD-10-CM | POA: Diagnosis not present

## 2021-08-20 DIAGNOSIS — R0789 Other chest pain: Secondary | ICD-10-CM | POA: Diagnosis not present

## 2021-08-20 LAB — COMPREHENSIVE METABOLIC PANEL
ALT: 14 U/L (ref 0–44)
AST: 19 U/L (ref 15–41)
Albumin: 3.9 g/dL (ref 3.5–5.0)
Alkaline Phosphatase: 94 U/L (ref 38–126)
Anion gap: 17 — ABNORMAL HIGH (ref 5–15)
BUN: 62 mg/dL — ABNORMAL HIGH (ref 6–20)
CO2: 26 mmol/L (ref 22–32)
Calcium: 9.3 mg/dL (ref 8.9–10.3)
Chloride: 93 mmol/L — ABNORMAL LOW (ref 98–111)
Creatinine, Ser: 9.68 mg/dL — ABNORMAL HIGH (ref 0.61–1.24)
GFR, Estimated: 6 mL/min — ABNORMAL LOW (ref >60)
Glucose, Bld: 115 mg/dL — ABNORMAL HIGH (ref 70–99)
Potassium: 5.3 mmol/L — ABNORMAL HIGH (ref 3.5–5.1)
Sodium: 136 mmol/L (ref 135–145)
Total Bilirubin: 0.8 mg/dL (ref 0.3–1.2)
Total Protein: 7.6 g/dL (ref 6.5–8.1)

## 2021-08-20 LAB — CBC
HCT: 41.1 % (ref 39.0–52.0)
Hemoglobin: 13.4 g/dL (ref 13.0–17.0)
MCH: 28.8 pg (ref 26.0–34.0)
MCHC: 32.6 g/dL (ref 30.0–36.0)
MCV: 88.4 fL (ref 80.0–100.0)
Platelets: 242 10*3/uL (ref 150–400)
RBC: 4.65 MIL/uL (ref 4.22–5.81)
RDW: 19.1 % — ABNORMAL HIGH (ref 11.5–15.5)
WBC: 5.7 10*3/uL (ref 4.0–10.5)
nRBC: 0 % (ref 0.0–0.2)

## 2021-08-20 LAB — TROPONIN I (HIGH SENSITIVITY)
Troponin I (High Sensitivity): 23 ng/L — ABNORMAL HIGH (ref <18)
Troponin I (High Sensitivity): 36 ng/L — ABNORMAL HIGH (ref <18)

## 2021-08-20 MED ORDER — SEVELAMER CARBONATE 800 MG PO TABS
1600.0000 mg | ORAL_TABLET | Freq: Three times a day (TID) | ORAL | Status: DC
  Administered 2021-08-21: 1600 mg via ORAL
  Filled 2021-08-20 (×4): qty 2

## 2021-08-20 MED ORDER — AMLODIPINE BESYLATE 5 MG PO TABS: 10.0000 mg | ORAL_TABLET | Freq: Once | ORAL | Status: DC

## 2021-08-20 MED ORDER — ASPIRIN 81 MG PO CHEW: 324.0000 mg | CHEWABLE_TABLET | Freq: Once | ORAL | Status: DC

## 2021-08-20 MED ORDER — NITROGLYCERIN 0.4 MG SL SUBL
0.4000 mg | SUBLINGUAL_TABLET | SUBLINGUAL | Status: DC | PRN
  Administered 2021-08-20 – 2021-08-21 (×2): 0.4 mg via SUBLINGUAL
  Filled 2021-08-20: qty 1

## 2021-08-20 MED ORDER — LIDOCAINE VISCOUS HCL 2 % MT SOLN
15.0000 mL | Freq: Once | OROMUCOSAL | Status: AC
  Administered 2021-08-20: 15 mL via ORAL
  Filled 2021-08-20: qty 15

## 2021-08-20 MED ORDER — SODIUM CHLORIDE 0.9 % IV SOLN: INTRAVENOUS | Status: DC

## 2021-08-20 MED ORDER — DIPHENHYDRAMINE HCL 50 MG/ML IJ SOLN
12.5000 mg | Freq: Once | INTRAMUSCULAR | Status: AC
  Administered 2021-08-20: 12.5 mg via INTRAVENOUS
  Filled 2021-08-20: qty 1

## 2021-08-20 MED ORDER — ALUM & MAG HYDROXIDE-SIMETH 200-200-20 MG/5ML PO SUSP
30.0000 mL | Freq: Once | ORAL | Status: AC
  Administered 2021-08-20: 30 mL via ORAL
  Filled 2021-08-20: qty 30

## 2021-08-20 MED ORDER — HYDRALAZINE HCL 50 MG PO TABS: 50.0000 mg | ORAL_TABLET | Freq: Once | ORAL | Status: DC

## 2021-08-20 MED ORDER — NITROGLYCERIN 2 % TD OINT
1.0000 [in_us] | TOPICAL_OINTMENT | Freq: Once | TRANSDERMAL | Status: AC
  Administered 2021-08-20: 1 [in_us] via TOPICAL
  Filled 2021-08-20: qty 1

## 2021-08-20 MED ORDER — OXYCODONE-ACETAMINOPHEN 5-325 MG PO TABS
1.0000 | ORAL_TABLET | Freq: Once | ORAL | Status: AC
  Administered 2021-08-20: 1 via ORAL
  Filled 2021-08-20: qty 1

## 2021-08-20 MED ORDER — PANTOPRAZOLE SODIUM 40 MG IV SOLR
40.0000 mg | INTRAVENOUS | Status: DC
  Administered 2021-08-21: 40 mg via INTRAVENOUS
  Filled 2021-08-20: qty 40

## 2021-08-20 NOTE — ED Provider Notes (Signed)
Emergency Medicine Provider Triage Evaluation Note  [name] , a 55 y.o. male  was evaluated in triage.  Pt complains of central chest pain.  Patient presented to the emergency department with chest pain x2 days.  Patient does have a history of MI, 4 stents placed in April of this year.  Patient states that initially he thought he had "eaten something bad" as he had had some mild discomfort.  Today the pain drastically increased, he became diaphoretic, had an episode of emesis.  EMS was called, patient received nitro and did have some improvement of symptoms.  Chest pain still there.  He states it feels similar to his previous MI.  Review of Systems  Positive: Chest pain, shortness of breath, 1 episode of emesis Negative: Fevers, chills, nasal congestion, sore throat, cough, abdominal pain, diarrhea or constipation  Physical Exam  BP (!) 161/90 (BP Location: Left Arm)   Pulse 78   Temp 98.3 F (36.8 C) (Oral)   Resp 20   Ht 5\' 11"  (1.803 m)   Wt 84 kg   SpO2 99%   BMI 25.83 kg/m  Gen:   Awake, no distress   Resp:  Normal effort  MSK:   Moves extremities without difficulty  Other:  No murmurs, rubs, gallops.  Medical Decision Making  Medically screening exam initiated at 4:16 PM.  Appropriate orders placed.  Valla Leaver was informed that the remainder of the evaluation will be completed by another provider, this initial triage assessment does not replace that evaluation, and the importance of remaining in the ED until their evaluation is complete.  Patient presented with chest pain.  History of MI with stents placed 7 months ago.  Patient had a sudden increase in pain today, became diaphoretic, had an episode of emesis.  Chest pain has improved with nitro but is still present.  Patient will have labs, EKG, chest x-ray.   Brynda Peon 08/20/21 1618    Merlyn Lot, MD 08/20/21 1753

## 2021-08-20 NOTE — H&P (Signed)
History and Physical    [name]:[id number] DOB: 04/14/1966 DOA: 08/20/2021  PCP: [name], MD    Patient coming from:  Home  Cardiologist: Karen Kays with [facility] heart association Chief Complaint:  Chest pain.    HPI: [name] is a 55 y.o. male seen in ed with complaints of midsternal chest pain that has been going on for about 2 days.  Patient initially thought he had eaten something with the midsternal nature of the pain.  Pain has become progressively worse and patient reported diaphoresis along with nausea and vomiting.  Chest pain was relieved with nitroglycerin given by EMS.  Patient reports similar symptoms when he had his heart attack in April.     Pt has past medical history of heart disease and in April 2022 patient had a history of CAD/MI/4 stents.  Patient has past medical history of allergies to atorvastatin, rosuvastatin, amoxicillin, hydromorphone, Percocet, gabapentin, chlorhexidine.  Patient also has history of end-stage renal disease on dialysis, osteomyelitis of the right foot, diabetes mellitus type 2, history of gangrene of the foot, hypothyroidism, peripheral vascular disease.   ED Course:  Vitals:   08/20/21 2000 08/20/21 2203 08/20/21 2330 08/21/21 0000  BP: (!) 161/86 (!) 180/94 (!) 146/78 127/70  Pulse: 78 79 81 77  Resp: '16 16 20   ' Temp:      TempSrc:      SpO2: 99% 100% 94% 93%  Weight:      Height:      Emergency room patient is alert awake oriented oxygenating above 95% on room air. Initial blood work shows CMP sodium potassium 5.3 chloride low at 93 glucose 115 creatinine 9.68 anion gap of 17 serum bicarb of 26, troponin initially of 23 followed by 36, CBC normal WBC count and no hemoglobin 13.4 platelets of 242.  Respiratory panel is negative for COVID and influenza.  In the emergency room patient received amlodipine 10, aspirin 324, hydralazine 50, nitrite 2% ointment 1 inch paste, sublingual nitro, Percocet  for pain.  Review of Systems:  Review of Systems  Constitutional: Negative.   HENT: Negative.    Eyes: Negative.   Respiratory: Negative.    Cardiovascular: Negative.   Gastrointestinal: Negative.   Genitourinary: Negative.   Musculoskeletal: Negative.   Skin: Negative.     Past Medical History:  Diagnosis Date   Abscess of right foot    Acute osteomyelitis of right foot ([place]) 05/15/2019   Anemia 06/23/2019   Cellulitis of right foot 05/22/2019   Chest pain 08/23/2013   Chills    at night - sometimes   Complication of anesthesia    Coronary artery disease    Diabetes mellitus    controlled by diet   Diabetes with renal manifestations(250.4) 08/23/2013   Diabetic foot ulcer- right second toe 05/14/2019   Diabetic infection of right foot ([place])    Diabetic retinopathy associated with type 2 diabetes mellitus ([place]) 06/23/2019   Eczema    ESRD on hemodialysis ([place]) 07/18/2011   Essential hypertension 10/18/2007   Qualifier: Diagnosis of  By: Loanne Drilling MD, Sean A    Fatigue    loss of fatigue   FOOT ULCER, LEFT 06/29/2008   Qualifier: Diagnosis of  By: Loanne Drilling MD, Sean A    Gangrene of toe of right foot ([place])    Headache(784.0)    Years ago   Heart murmur    years ago   History of blood transfusion    Hyperglycemia 05/05/2015  HYPERLIPIDEMIA 05/16/2008   Qualifier: Diagnosis of  By: Jenny Reichmann MD, Hunt Oris    Hypoglycemia, unspecified 11/13/2008   Qualifier: Diagnosis of  By: Jenny Reichmann MD, Hunt Oris    MRSA (methicillin resistant staph aureus) culture positive    MRSA INFECTION 10/18/2007   Qualifier: Diagnosis of  By: Marca Ancona RMA, Lucy     Myocardial infarction ([place])    Obesity, unspecified 08/23/2013   Osteomyelitis of great toe of right foot ([place]) 05/14/2019   Other forms of retinal detachment(361.89) 06/23/2019   PONV (postoperative nausea and vomiting)    Poor circulation    Secondary hyperparathyroidism ([place]) 06/23/2019   Sepsis ([place]) 05/14/2019   Type 2 diabetes mellitus ([place]) 08/23/2013    Ulcer of right foot, with necrosis of bone ([place])    Ulcer of right second toe with necrosis of bone [facility])     Past Surgical History:  Procedure Laterality Date   ABDOMINAL AORTOGRAM N/A 05/17/2019   Procedure: ABDOMINAL AORTOGRAM;  Surgeon: Elam Dutch, MD;  Location: [place] [facility];  Service: Cardiovascular;  Laterality: N/A;   AMPUTATION TOE Right 05/14/2019   Procedure: PARTIAL AMPUTATION SECOND TOE RIGHT FOOT;  Surgeon: Evelina Bucy, DPM;  Location: [place];  Service: Podiatry;  Laterality: Right;   AMPUTATION TOE Right 05/23/2019   Procedure: AMPUTATION OF SECOND TOE METATARSAL PHALANGEAL JOINT;  Surgeon: Evelina Bucy, DPM;  Location: [place];  Service: Podiatry;  Laterality: Right;   AMPUTATION TOE Right 05/27/2019   Procedure: Amputation Toe;  Surgeon: Evelina Bucy, DPM;  Location: [place];  Service: Podiatry;  Laterality: Right;  right third toe   APPLICATION OF WOUND VAC Right 05/27/2019   Procedure: Application Of Wound Vac;  Surgeon: Evelina Bucy, DPM;  Location: [place];  Service: Podiatry;  Laterality: Right;   AV FISTULA PLACEMENT  06-18-11   Right brachiocephalic AVF   BONE BIOPSY Right 05/14/2019   Procedure: OPEN SUPERFICIAL BONE BIOPSY GREAT TOE;  Surgeon: Evelina Bucy, DPM;  Location: [place];  Service: Podiatry;  Laterality: Right;   CARDIAC CATHETERIZATION     CORONARY BALLOON ANGIOPLASTY N/A 09/24/2020   Procedure: CORONARY BALLOON ANGIOPLASTY;  Surgeon: Martinique, Peter M, MD;  Location: [place] [facility];  Service: Cardiovascular;  Laterality: N/A;   CORONARY STENT INTERVENTION N/A 09/24/2020   Procedure: CORONARY STENT INTERVENTION;  Surgeon: Martinique, Peter M, MD;  Location: [place] [facility];  Service: Cardiovascular;  Laterality: N/A;   EYE SURGERY     left eye for Laser, diabetic retinopathy   FISTULOGRAM Right 11/06/2011   Procedure: FISTULOGRAM;  Surgeon: Conrad [place], MD;  Location: [facility] CATH LAB;  Service: Cardiovascular;  Laterality: Right;   HEMATOMA  EVACUATION Right Feb. 25, 2014   HEMATOMA EVACUATION Right 12/14/2012   Procedure: EVACUATION HEMATOMA;  Surgeon: Conrad [place], MD;  Location: [place];  Service: Vascular;  Laterality: Right;   INCISION AND DRAINAGE Right 05/23/2019   Procedure: INCISION AND DRAINAGE OF RIGHT FOOT DEEP SPACE ABSCESS;  Surgeon: Evelina Bucy, DPM;  Location: [place];  Service: Podiatry;  Laterality: Right;   INSERTION OF DIALYSIS CATHETER  12/14/2012   Procedure: INSERTION OF DIALYSIS CATHETER;  Surgeon: Conrad , MD;  Location: [place];  Service: Vascular;;   INSERTION OF DIALYSIS CATHETER Right 01/07/2021   Procedure: INSERTION OF DIALYSIS CATHETER;  Surgeon: Elam Dutch, MD;  Location: [place];  Service: Vascular;  Laterality: Right;   INSERTION OF DIALYSIS CATHETER Left 01/08/2021  Procedure: INSERTION OF LEFT INTERNAL JUGULAR DIALYSIS CATHETER, tunneled;  Surgeon: Angelia Mould, MD;  Location: [facility] OR;  Service: Vascular;  Laterality: Left;   INTRAVASCULAR PRESSURE WIRE/FFR STUDY N/A 09/24/2020   Procedure: INTRAVASCULAR PRESSURE WIRE/FFR STUDY;  Surgeon: Martinique, Peter M, MD;  Location: [place] [facility];  Service: Cardiovascular;  Laterality: N/A;   IRRIGATION AND DEBRIDEMENT FOOT Right 05/25/2019   Procedure: INCISION AND DRAINAGE FOOT;  Surgeon: Evelina Bucy, DPM;  Location: [place];  Service: Podiatry;  Laterality: Right;   IRRIGATION AND DEBRIDEMENT FOOT Right 05/27/2019   Procedure: IRRIGATION AND DEBRIDEMENT FOOT;  Surgeon: Evelina Bucy, DPM;  Location: [place];  Service: Podiatry;  Laterality: Right;   LEFT HEART CATH AND CORONARY ANGIOGRAPHY N/A 09/24/2020   Procedure: LEFT HEART CATH AND CORONARY ANGIOGRAPHY;  Surgeon: Martinique, Peter M, MD;  Location: [place] [facility];  Service: Cardiovascular;  Laterality: N/A;   LOWER EXTREMITY ANGIOGRAPHY Bilateral 05/17/2019   Procedure: LOWER EXTREMITY ANGIOGRAPHY;  Surgeon: Elam Dutch, MD;  Location: [place] [facility];  Service: Cardiovascular;   Laterality: Bilateral;   REMOVAL OF A DIALYSIS CATHETER Right 01/08/2021   Procedure: REMOVAL OF TEMPORARY DIALYSIS CATHETER, LEFT FEMORAL VEIN;  Surgeon: Angelia Mould, MD;  Location: [facility] OR;  Service: Vascular;  Laterality: Right;   REVISON OF ARTERIOVENOUS FISTULA Right 12/14/2012   Procedure: REVISON OF upper arm ARTERIOVENOUS FISTULA using 52mx10cm gortex graft;  Surgeon: BConrad [place] MD;  Location: [place]  Service: Vascular;  Laterality: Right;   REVISON OF ARTERIOVENOUS FISTULA Right 12/27/2020   Procedure: RIGHT UPPER EXTREMITY ARTERIOVENOUS FISTULA REVISiON;  Surgeon: HCherre Robins MD;  [facility];  Service: Vascular;  Laterality: Right;  PERIPHERAL NERVE BLOCK   RIGHT/LEFT HEART CATH AND CORONARY ANGIOGRAPHY N/A 08/07/2020   Procedure: RIGHT/LEFT HEART CATH AND CORONARY ANGIOGRAPHY;  Surgeon: JMartinique Peter M, MD;  [facility];  Service: Cardiovascular;  Laterality: N/A;   SOFT TISSUE MASS EXCISION     Right arm, Left leg  for MRSA infection     reports that he has never smoked. He has never used smokeless tobacco. He reports that he does not drink alcohol and does not use drugs.  Allergies  Allergen Reactions   Atorvastatin     Other reaction(s): Muscle Pain   Rosuvastatin     Other reaction(s): Muscle Pain   Amoxicillin Itching    Did it involve swelling of the face/tongue/throat, SOB, or low BP? Unknown Did it involve sudden or severe rash/hives, skin peeling, or any reaction on the inside of your mouth or nose? Unknown Did you need to seek medical attention at a hospital or doctor's office? Unknown When did it last happen?      20 years ago If all above answers are "NO", may proceed with cephalosporin use.    Chlorhexidine Hives    Patient has blistering   Hydromorphone Itching    Patient states he may take with benadryl. Makes feet itch.   Percocet [Oxycodone-Acetaminophen] Itching and Other (See Comments)    Legs only. Itching and  burning feeling.   Gabapentin Nausea And Vomiting    Stomach issues     Family History  Problem Relation Age of Onset   Diabetes Mother     Prior to Admission medications   Medication Sig Start Date End Date Taking? Authorizing Provider  amLODipine (NORVASC) 10 MG tablet Take 10 mg by mouth at bedtime.  08/03/20   [provider]  aspirin EC 81 MG tablet Take 1 tablet (81 mg total) by mouth daily. 06/28/19   Wellington Hampshire, MD  blood glucose meter kit and supplies Dispense based on patient and insurance preference. Use up to four times daily as directed. (FOR ICD-10 E10.9, E11.9). 05/18/19   Mikhail, Velta Addison, DO  blood glucose meter kit and supplies Dispense based on patient and insurance preference. Use up to four times daily as directed. (FOR ICD-9 250.00, 250.01). 05/18/19   Cristal Ford, DO  Continuous Blood Gluc Sensor (FREESTYLE LIBRE 14 DAY SENSOR) MISC See admin instructions. 06/20/19   [provider]  glucose blood (ACCU-CHEK AVIVA PLUS) test strip Use as instructed 05/18/19   Cristal Ford, DO  hydrALAZINE (APRESOLINE) 50 MG tablet Take 50 mg by mouth 3 (three) times daily. 12/18/20   [provider]  isosorbide dinitrate (ISORDIL) 20 MG tablet Take 20 mg by mouth 3 (three) times daily. 08/24/20   [provider]  Lancet Devices [facility]) lancets Use as instructed 05/18/19   Cristal Ford, DO  lidocaine-prilocaine (EMLA) cream Apply 1 application topically as needed. Patient taking differently: Apply 1 application topically as needed (port access). 01/02/21   Cherre Robins, MD  LOKELMA 10 g PACK packet Take 10 g by mouth. Patient says he takes on HD days 01/14/21   [provider]  metoprolol tartrate (LOPRESSOR) 25 MG tablet Take 25 mg by mouth 2 (two) times daily.    [provider]  propranolol (INDERAL) 80 MG tablet Take 1 tablet (80 mg total) by mouth 2 (two) times daily. Patient not taking: Reported on  06/21/2021 09/26/20   Modena Jansky, MD  sevelamer carbonate (RENVELA) 800 MG tablet Take 1,600 mg by mouth 3 (three) times daily with meals. 07/25/20   [provider]  ticagrelor (BRILINTA) 90 MG TABS tablet TAKE 1 TABLET (90 MG TOTAL) BY MOUTH TWO TIMES DAILY. 09/26/20 09/26/21  Modena Jansky, MD  loratadine (CLARITIN) 10 MG tablet Take 10 mg by mouth daily as needed for allergies.  11/06/19  [provider]    Physical Exam: Vitals:   08/20/21 2000 08/20/21 2203 08/20/21 2330 08/21/21 0000  BP: (!) 161/86 (!) 180/94 (!) 146/78 127/70  Pulse: 78 79 81 77  Resp: '16 16 20   ' Temp:      TempSrc:      SpO2: 99% 100% 94% 93%  Weight:      Height:       Physical Exam Vitals and nursing note reviewed.  Constitutional:      General: He is not in acute distress.    Appearance: Normal appearance. He is not ill-appearing, toxic-appearing or diaphoretic.  HENT:     Head: Normocephalic and atraumatic.     Right Ear: External ear normal.     Left Ear: External ear normal.     Nose: Nose normal.  Eyes:     Extraocular Movements: Extraocular movements intact.     Pupils: Pupils are equal, round, and reactive to light.  Cardiovascular:     Rate and Rhythm: Normal rate and regular rhythm.     Pulses: Normal pulses.     Heart sounds: Normal heart sounds.  Pulmonary:     Effort: Pulmonary effort is normal.     Breath sounds: Normal breath sounds.  Abdominal:     General: Bowel sounds are normal. There is no distension.     Palpations: Abdomen is soft. There is no mass.  Tenderness: There is no abdominal tenderness. There is no guarding.     Hernia: No hernia is present.  Neurological:     General: No focal deficit present.     Mental Status: He is alert and oriented to person, place, and time.  Psychiatric:        Mood and Affect: Mood normal.        Behavior: Behavior normal.     Labs on Admission: I have personally reviewed following labs and imaging  studies  No results for input(s): CKTOTAL, CKMB, TROPONINI in the last 72 hours. Lab Results  Component Value Date   WBC 5.7 08/20/2021   HGB 13.4 08/20/2021   HCT 41.1 08/20/2021   MCV 88.4 08/20/2021   PLT 242 08/20/2021    Recent Labs  Lab 08/20/21 1655  NA 136  K 5.3*  CL 93*  CO2 26  BUN 62*  CREATININE 9.68*  CALCIUM 9.3  PROT 7.6  BILITOT 0.8  ALKPHOS 94  ALT 14  AST 19  GLUCOSE 115*    COVID-19 Labs No results for input(s): DDIMER, FERRITIN, LDH, CRP in the last 72 hours. Lab Results  Component Value Date   SARSCOV2NAA NEGATIVE 02/11/2021   [place] NEGATIVE 01/07/2021   [place] NEGATIVE 12/25/2020   [place] NEGATIVE 09/21/2020    Radiological Exams on Admission: DG Chest 2 View  Result Date: 08/20/2021 CLINICAL DATA:  Chest pain EXAM: CHEST - 2 VIEW COMPARISON:  02/10/2021 FINDINGS: A vascular stent is identified in the expected location of the right subclavian vein. Normal heart size. No pleural effusion or edema. No airspace opacities identified. The visualized osseous structures are unremarkable. IMPRESSION: No active cardiopulmonary abnormalities. Electronically Signed   By: Kerby Moors M.D.   On: 08/20/2021 16:35    EKG: Independently reviewed.  EKG is sinus rhythm at rate of 80 ST and T wave abnormalities in inferolateral leads and poor R wave progression. [place] cath 09/24/20: Mid LAD lesion is 60% stenosed. Dist LAD lesion is 80% stenosed. 1st Diag lesion is 90% stenosed. 2nd Diag lesion is 95% stenosed. Balloon angioplasty was performed using a BALLOON SAPPHIRE 2.5X12. Post intervention, there is a 80% residual stenosis. Lat 2nd Diag lesion is 100% stenosed. 1st Mrg lesion is 95% stenosed. 2nd Mrg lesion is 90% stenosed. A drug-eluting stent was successfully placed using a STENT RESOLUTE ONYX 2.5X12. Post intervention, there is a 0% residual stenosis. RPAV lesion is 80% stenosed. Ramus lesion is 90% stenosed. LV end diastolic  pressure is moderately elevated.  1. Severe 3 vessel obstructive CAD. Involves primarily branch and distal vessels. Anatomy really unchanged from October. The culprit vessel is the second OM. 2. Moderately elevated LVEDP 27 mm Hg 3. Unsuccessful PCI of the second diagonal. This vessel is small and tortuous. Expanded with balloon with some improvement in flow but lesion unyielding.  4. Successful PCI of the second OM with DES x 1  Plan: DAPT for one year.     Assessment/Plan Principal Problem:   Unstable angina (HCC) Active Problems:   3-vessel coronary artery disease   Type 2 diabetes mellitus ([place])   ESRD on dialysis ([place]) Unstable angina/CAD: Will admit patient to progressive care unit with continuous cardiac monitoring. Continue patient on his antiplatelet regimen and statin therapy with as needed nitroglycerin and supplemental oxygen as needed.  Continue metoprolol hydralazine amlodipine Imdur Brilinta. Will consult cardiology and defer plan for management of this patient as he is already had cardiac catheterization in April.  Per cardiology  to see if the patient will need a repeat cardiac cath.  Diabetes mellitus type 2: Diet managed. Glycemic protocol.  End-stage renal disease on dialysis: A.m. team to request nephrology consult for hemodialysis.   DVT prophylaxis:  Heparin  Code Status:  Full code  Family Communication:  Jamontae, Thwaites (Spouse)  [phone] (Mobile)   Disposition Plan:  Home  Consults called:  Cardiology consult - Dr. Corky Sox.  Admission status: Inpatient   Para Skeans MD [facility] [phone] How to contact the [facility] Attending or Consulting provider [place] or covering provider during after hours [place], for this patient.    Check the care team in [facility] and look for a) attending/consulting [facility] provider listed and b) the [facility] team listed Log into [email or web address] and use [place]'s universal password to access. If you do not have the  password, please contact the hospital operator. Locate the [facility] provider you are looking for under [facility] and page to a number that you can be directly reached. If you still have difficulty reaching the provider, please page the [facility] (Director on Call) for the Hospitalists listed on amion for assistance. [email or web address] Password [facility] 08/21/2021, 12:56 AM

## 2021-08-20 NOTE — ED Notes (Signed)
Pt c/o chest pain at this time.  Pt states the pain is similar to what it was like when he came in.  Rpt ECG obtained at this time and will give prn SL nitroglycerin.

## 2021-08-20 NOTE — ED Provider Notes (Signed)
[facility] Emergency Department Provider Note    Event Date/Time   First MD Initiated Contact with Patient 08/20/21 1849     (approximate)  I have reviewed the triage vital signs and the nursing notes.   HISTORY  Chief Complaint Chest Pain    HPI [name] is a 55 y.o. male extensive past medical history as listed below on dialysis with last dialysis session yesterday presents to the ER for episode of chest discomfort that occurred around 1:00 after he was eating lunch.  Started having some epigastric discomfort and did need to vomit x1.  Did have some discomfort going up into his chest.  No shortness of breath.  States it feels similar to his previous heart attack.  He has been compliant with his medications.  Currently states symptoms have significantly improved.  No fevers.  Past Medical History:  Diagnosis Date   Abscess of right foot    Acute osteomyelitis of right foot ([place]) 05/15/2019   Anemia 06/23/2019   Cellulitis of right foot 05/22/2019   Chest pain 08/23/2013   Chills    at night - sometimes   Complication of anesthesia    Coronary artery disease    Diabetes mellitus    controlled by diet   Diabetes with renal manifestations(250.4) 08/23/2013   Diabetic foot ulcer- right second toe 05/14/2019   Diabetic infection of right foot ([place])    Diabetic retinopathy associated with type 2 diabetes mellitus ([place]) 06/23/2019   Eczema    ESRD on hemodialysis ([place]) 07/18/2011   Essential hypertension 10/18/2007   Qualifier: Diagnosis of  By: Loanne Drilling MD, Sean A    Fatigue    loss of fatigue   FOOT ULCER, LEFT 06/29/2008   Qualifier: Diagnosis of  By: Loanne Drilling MD, Sean A    Gangrene of toe of right foot ([place])    Headache(784.0)    Years ago   Heart murmur    years ago   History of blood transfusion    Hyperglycemia 05/05/2015   HYPERLIPIDEMIA 05/16/2008   Qualifier: Diagnosis of  By: Jenny Reichmann MD, Hunt Oris    Hypoglycemia, unspecified 11/13/2008    Qualifier: Diagnosis of  By: Jenny Reichmann MD, Hunt Oris    MRSA (methicillin resistant staph aureus) culture positive    MRSA INFECTION 10/18/2007   Qualifier: Diagnosis of  By: Marca Ancona RMA, Lucy     Myocardial infarction ([place])    Obesity, unspecified 08/23/2013   Osteomyelitis of great toe of right foot ([place]) 05/14/2019   Other forms of retinal detachment(361.89) 06/23/2019   PONV (postoperative nausea and vomiting)    Poor circulation    Secondary hyperparathyroidism ([place]) 06/23/2019   Sepsis ([place]) 05/14/2019   Type 2 diabetes mellitus ([place]) 08/23/2013   Ulcer of right foot, with necrosis of bone (HCC)    Ulcer of right second toe with necrosis of bone ([place])    Family History  Problem Relation Age of Onset   Diabetes Mother    Past Surgical History:  Procedure Laterality Date   ABDOMINAL AORTOGRAM N/A 05/17/2019   Procedure: ABDOMINAL AORTOGRAM;  Surgeon: Elam Dutch, MD;  Location: [place] [facility];  Service: Cardiovascular;  Laterality: N/A;   AMPUTATION TOE Right 05/14/2019   Procedure: PARTIAL AMPUTATION SECOND TOE RIGHT FOOT;  Surgeon: Evelina Bucy, DPM;  Location: [place];  Service: Podiatry;  Laterality: Right;   AMPUTATION TOE Right 05/23/2019   Procedure: AMPUTATION OF SECOND TOE METATARSAL PHALANGEAL JOINT;  Surgeon: March Rummage,  Christian Mate, DPM;  Location: [place];  Service: Podiatry;  Laterality: Right;   AMPUTATION TOE Right 05/27/2019   Procedure: Amputation Toe;  Surgeon: Evelina Bucy, DPM;  Location: [place];  Service: Podiatry;  Laterality: Right;  right third toe   APPLICATION OF WOUND VAC Right 05/27/2019   Procedure: Application Of Wound Vac;  Surgeon: Evelina Bucy, DPM;  Location: [place];  Service: Podiatry;  Laterality: Right;   AV FISTULA PLACEMENT  06-18-11   Right brachiocephalic AVF   BONE BIOPSY Right 05/14/2019   Procedure: OPEN SUPERFICIAL BONE BIOPSY GREAT TOE;  Surgeon: Evelina Bucy, DPM;  Location: [place];  Service: Podiatry;  Laterality: Right;   CARDIAC  CATHETERIZATION     CORONARY BALLOON ANGIOPLASTY N/A 09/24/2020   Procedure: CORONARY BALLOON ANGIOPLASTY;  Surgeon: Martinique, Peter M, MD;  Location: [place] [facility];  Service: Cardiovascular;  Laterality: N/A;   CORONARY STENT INTERVENTION N/A 09/24/2020   Procedure: CORONARY STENT INTERVENTION;  Surgeon: Martinique, Peter M, MD;  Location: [place] [facility];  Service: Cardiovascular;  Laterality: N/A;   EYE SURGERY     left eye for Laser, diabetic retinopathy   FISTULOGRAM Right 11/06/2011   Procedure: FISTULOGRAM;  Surgeon: Conrad Centerfield, MD;  Location: [facility] CATH LAB;  Service: Cardiovascular;  Laterality: Right;   HEMATOMA EVACUATION Right Feb. 25, 2014   HEMATOMA EVACUATION Right 12/14/2012   Procedure: EVACUATION HEMATOMA;  Surgeon: Conrad [place], MD;  Location: [place];  Service: Vascular;  Laterality: Right;   INCISION AND DRAINAGE Right 05/23/2019   Procedure: INCISION AND DRAINAGE OF RIGHT FOOT DEEP SPACE ABSCESS;  Surgeon: Evelina Bucy, DPM;  Location: [place];  Service: Podiatry;  Laterality: Right;   INSERTION OF DIALYSIS CATHETER  12/14/2012   Procedure: INSERTION OF DIALYSIS CATHETER;  Surgeon: Conrad [place], MD;  Location: [place];  Service: Vascular;;   INSERTION OF DIALYSIS CATHETER Right 01/07/2021   Procedure: INSERTION OF DIALYSIS CATHETER;  Surgeon: Elam Dutch, MD;  Location: [place];  Service: Vascular;  Laterality: Right;   INSERTION OF DIALYSIS CATHETER Left 01/08/2021   Procedure: INSERTION OF LEFT INTERNAL JUGULAR DIALYSIS CATHETER, tunneled;  Surgeon: Angelia Mould, MD;  Location: [place];  Service: Vascular;  Laterality: Left;   INTRAVASCULAR PRESSURE WIRE/FFR STUDY N/A 09/24/2020   Procedure: INTRAVASCULAR PRESSURE WIRE/FFR STUDY;  Surgeon: Martinique, Peter M, MD;  Location: [place] [facility];  Service: Cardiovascular;  Laterality: N/A;   IRRIGATION AND DEBRIDEMENT FOOT Right 05/25/2019   Procedure: INCISION AND DRAINAGE FOOT;  Surgeon: Evelina Bucy, DPM;  Location: [place];  Service: Podiatry;  Laterality: Right;   IRRIGATION AND DEBRIDEMENT FOOT Right 05/27/2019   Procedure: IRRIGATION AND DEBRIDEMENT FOOT;  Surgeon: Evelina Bucy, DPM;  Location: [place];  Service: Podiatry;  Laterality: Right;   LEFT HEART CATH AND CORONARY ANGIOGRAPHY N/A 09/24/2020   Procedure: LEFT HEART CATH AND CORONARY ANGIOGRAPHY;  Surgeon: Martinique, Peter M, MD;  Location: [place] [facility];  Service: Cardiovascular;  Laterality: N/A;   LOWER EXTREMITY ANGIOGRAPHY Bilateral 05/17/2019   Procedure: LOWER EXTREMITY ANGIOGRAPHY;  Surgeon: Elam Dutch, MD;  Location: [place] [facility];  Service: Cardiovascular;  Laterality: Bilateral;   REMOVAL OF A DIALYSIS CATHETER Right 01/08/2021   Procedure: REMOVAL OF TEMPORARY DIALYSIS CATHETER, LEFT FEMORAL VEIN;  Surgeon: Angelia Mould, MD;  Location: [facility];  Service: Vascular;  Laterality: Right;   REVISON OF ARTERIOVENOUS FISTULA Right 12/14/2012  Procedure: REVISON OF upper arm ARTERIOVENOUS FISTULA using 76mx10cm gortex graft;  Surgeon: BConrad Hartland MD;  [place]  Service: Vascular;  Laterality: Right;   REVISON OF ARTERIOVENOUS FISTULA Right 12/27/2020   Procedure: RIGHT UPPER EXTREMITY ARTERIOVENOUS FISTULA REVISiON;  Surgeon: HCherre Robins MD;  [facility];  Service: Vascular;  Laterality: Right;  PERIPHERAL NERVE BLOCK   RIGHT/LEFT HEART CATH AND CORONARY ANGIOGRAPHY N/A 08/07/2020   Procedure: RIGHT/LEFT HEART CATH AND CORONARY ANGIOGRAPHY;  Surgeon: JMartinique Peter M, MD;  [place] [place] [facility];  Service: Cardiovascular;  Laterality: N/A;   SOFT TISSUE MASS EXCISION     Right arm, Left leg  for MRSA infection   Patient Active Problem List   Diagnosis Date Noted   Nuclear sclerotic cataract of right eye 06/11/2021   Nuclear sclerotic cataract of left eye 06/11/2021   Posterior subcapsular age-related cataract, left eye 06/11/2021   Stable treated proliferative diabetic retinopathy of left eye determined  by examination associated with type 2 diabetes mellitus ([place] 06/11/2021   Right epiretinal membrane 06/11/2021   Stable treated proliferative diabetic retinopathy of right eye determined by examination associated with type 2 diabetes mellitus (H[place] 06/11/2021   Hyperkalemia 01/09/2021   ESRD (end stage renal disease) ([place]at[place]    Dialysis AV fistula malfunction, initial encounter ([place] 01/07/2021   NSTEMI (non-ST elevated myocardial infarction) (HF[place] Y[place] 09/21/2020   3-vessel coronary artery disease 08/09/2020   Pulmonary hypertension, unspecified (H[place] 08/09/2020   Pancytopenia ([place] 08/06/2020   Allergy, unspecified, subsequent encounter 06/28/2020   Anaphylactic shock, unspecified, subsequent encounter 06/28/2020   Lymphedema of both lower extremities 12/29/2019   Hyperphosphatemia 12/11/2019   Encounter for post surgical wound check 08/16/2019   Post-op pain 08/16/2019   S/P transmetatarsal amputation of foot, right ([place] 08/16/2019   Unspecified protein-calorie malnutrition (HDur[place] 07/12/2019   PAD (peripheral artery disease) (H[place] 06/30/2019   Anemia 06/23/2019   Diabetic retinopathy associated with type 2 diabetes mellitus (HF[place] 06/23/2019   Other forms of retinal detachment(361.89) 06/23/2019   Secondary hyperparathyroidism ([place] 06/23/2019   Osteomyelitis, unspecified (H[place] 05/31/2019   Gangrene of toe of right foot ([place]    Ulcer of right foot, with necrosis of bone (HRed[place]    Abscess of right foot    Diabetic infection of right foot (HT[place]    Ulcer of right second toe with necrosis of bone (HMe[place]    Cellulitis of right foot 05/22/2019   Acute osteomyelitis of right foot (H[place] 05/15/2019   Osteomyelitis of great toe of right foot (HP[place] 05/14/2019   Diabetic foot ulcer- right second toe 05/14/2019   Sepsis (H[place] 05/14/2019   Hypothyroidism, unspecified 01/11/2018   Hypercalcemia 06/14/2016   Hyperlipidemia 05/11/2015   Hyperglycemia 05/05/2015   Diarrhea, unspecified 02/01/2015    Fever, unspecified 02/01/2015   Pain, unspecified 02/01/2015   Pruritus, unspecified 02/01/2015   Coagulation defect, unspecified (H[place] 07/07/2014   Encounter for removal of sutures 05/30/2014   Encounter for immunization 04/19/2014   Chest pain 08/23/2013   Type 2 diabetes mellitus (HK[place] 08/23/2013   Hemodialysis status (HCitrus Park 08/23/2013   Chronic kidney disease, stage V (HShip Bottom 08/23/2013   Obesity, unspecified 195/06/3266  Complication of vascular dialysis catheter 12/15/2012   Other fluid overload 12/07/2012   Other specified complication of vascular prosthetic devices, implants and grafts, subsequent encounter 08/06/2012   ESRD on dialysis ([place] 07/18/2011   Pure hypercholesterolemia 06/23/2011   Iron deficiency anemia, unspecified 06/21/2011   HYPOGLYCEMIA, UNSPECIFIED 11/13/2008   RENAL INSUFFICIENCY  07/31/2008   FOOT ULCER, LEFT 06/29/2008   HYPERLIPIDEMIA 05/16/2008   MRSA INFECTION 10/18/2007   Essential hypertension 10/18/2007      Prior to Admission medications   Medication Sig Start Date End Date Taking? Authorizing Provider  amLODipine (NORVASC) 10 MG tablet Take 10 mg by mouth at bedtime.  08/03/20   [provider]  aspirin EC 81 MG tablet Take 1 tablet (81 mg total) by mouth daily. 06/28/19   Wellington Hampshire, MD  blood glucose meter kit and supplies Dispense based on patient and insurance preference. Use up to four times daily as directed. (FOR ICD-10 E10.9, E11.9). 05/18/19   Mikhail, Velta Addison, DO  blood glucose meter kit and supplies Dispense based on patient and insurance preference. Use up to four times daily as directed. (FOR ICD-9 250.00, 250.01). 05/18/19   Cristal Ford, DO  Continuous Blood Gluc Sensor (FREESTYLE LIBRE 14 DAY SENSOR) MISC See admin instructions. 06/20/19   [provider]  glucose blood (ACCU-CHEK AVIVA PLUS) test strip Use as instructed 05/18/19   Cristal Ford, DO  hydrALAZINE (APRESOLINE) 50 MG tablet Take 50 mg by mouth  3 (three) times daily. 12/18/20   [provider]  isosorbide dinitrate (ISORDIL) 20 MG tablet Take 20 mg by mouth 3 (three) times daily. 08/24/20   [provider]  Lancet Devices [facility]) lancets Use as instructed 05/18/19   Cristal Ford, DO  lidocaine-prilocaine (EMLA) cream Apply 1 application topically as needed. Patient taking differently: Apply 1 application topically as needed (port access). 01/02/21   Cherre Robins, MD  LOKELMA 10 g PACK packet Take 10 g by mouth. Patient says he takes on HD days 01/14/21   [provider]  metoprolol tartrate (LOPRESSOR) 25 MG tablet Take 25 mg by mouth 2 (two) times daily.    [provider]  propranolol (INDERAL) 80 MG tablet Take 1 tablet (80 mg total) by mouth 2 (two) times daily. Patient not taking: Reported on 06/21/2021 09/26/20   Modena Jansky, MD  sevelamer carbonate (RENVELA) 800 MG tablet Take 1,600 mg by mouth 3 (three) times daily with meals. 07/25/20   [provider]  ticagrelor (BRILINTA) 90 MG TABS tablet TAKE 1 TABLET (90 MG TOTAL) BY MOUTH TWO TIMES DAILY. 09/26/20 09/26/21  Modena Jansky, MD  loratadine (CLARITIN) 10 MG tablet Take 10 mg by mouth daily as needed for allergies.  11/06/19  [provider]    Allergies Atorvastatin, Rosuvastatin, Amoxicillin, Chlorhexidine, Hydromorphone, Percocet [oxycodone-acetaminophen], and Gabapentin    Social History Social History   Tobacco Use   Smoking status: Never   Smokeless tobacco: Never  Vaping Use   Vaping Use: Never used  Substance Use Topics   Alcohol use: No   Drug use: No    Review of Systems Patient denies headaches, rhinorrhea, blurry vision, numbness, shortness of breath, chest pain, edema, cough, abdominal pain, nausea, vomiting, diarrhea, dysuria, fevers, rashes or hallucinations unless otherwise stated above in HPI. ____________________________________________   PHYSICAL EXAM:  VITAL  SIGNS: Vitals:   08/20/21 1922 08/20/21 2000  BP: (!) 193/105 (!) 161/86  Pulse: 78 78  Resp: 17 16  Temp:    SpO2: 100% 99%    Constitutional: Alert and oriented.  Eyes: Conjunctivae are normal.  Head: Atraumatic. Nose: No congestion/rhinnorhea. Mouth/Throat: Mucous membranes are moist.   Neck: No stridor. Painless ROM.  Cardiovascular: Normal rate, regular rhythm. Grossly normal heart sounds.  Good peripheral circulation. Respiratory: Normal respiratory effort.  No  retractions. Lungs CTAB. Gastrointestinal: Soft and nontender. No distention. No abdominal bruits. No CVA tenderness. Genitourinary:  Musculoskeletal: No lower extremity tenderness nor edema.  No joint effusions. Neurologic:  Normal speech and language. No gross focal neurologic deficits are appreciated. No facial droop Skin:  Skin is warm, dry and intact. No rash noted. Psychiatric: Mood and affect are normal. Speech and behavior are normal.  ____________________________________________   LABS (all labs ordered are listed, but only abnormal results are displayed)  Results for orders placed or performed during the hospital encounter of 08/20/21 (from the past 24 hour(s))  CBC     Status: Abnormal   Collection Time: 08/20/21  4:55 PM  Result Value Ref Range   WBC 5.7 4.0 - 10.5 K/uL   RBC 4.65 4.22 - 5.81 MIL/uL   Hemoglobin 13.4 13.0 - 17.0 g/dL   HCT 41.1 39.0 - 52.0 %   MCV 88.4 80.0 - 100.0 fL   MCH 28.8 26.0 - 34.0 pg   MCHC 32.6 30.0 - 36.0 g/dL   RDW 19.1 (H) 11.5 - 15.5 %   Platelets 242 150 - 400 K/uL   nRBC 0.0 0.0 - 0.2 %  Comprehensive metabolic panel     Status: Abnormal   Collection Time: 08/20/21  4:55 PM  Result Value Ref Range   Sodium 136 135 - 145 mmol/L   Potassium 5.3 (H) 3.5 - 5.1 mmol/L   Chloride 93 (L) 98 - 111 mmol/L   CO2 26 22 - 32 mmol/L   Glucose, Bld 115 (H) 70 - 99 mg/dL   BUN 62 (H) 6 - 20 mg/dL   Creatinine, Ser 9.68 (H) 0.61 - 1.24 mg/dL   Calcium 9.3 8.9 - 10.3  mg/dL   Total Protein 7.6 6.5 - 8.1 g/dL   Albumin 3.9 3.5 - 5.0 g/dL   AST 19 15 - 41 U/L   ALT 14 0 - 44 U/L   Alkaline Phosphatase 94 38 - 126 U/L   Total Bilirubin 0.8 0.3 - 1.2 mg/dL   GFR, Estimated 6 (L) >60 mL/min   Anion gap 17 (H) 5 - 15  Troponin I (High Sensitivity)     Status: Abnormal   Collection Time: 08/20/21  4:55 PM  Result Value Ref Range   Troponin I (High Sensitivity) 23 (H) <18 ng/L  Troponin I (High Sensitivity)     Status: Abnormal   Collection Time: 08/20/21  7:19 PM  Result Value Ref Range   Troponin I (High Sensitivity) 36 (H) <18 ng/L   ____________________________________________  EKG My review and personal interpretation at Time: 16:12   Indication: chest pain  Rate: 80  Rhythm: sinus Axis: normal Other: inferolateral st and twave abn, no stemi, poor r wave progression ____________________________________________  RADIOLOGY  I personally reviewed all radiographic images ordered to evaluate for the above acute complaints and reviewed radiology reports and findings.  These findings were personally discussed with the patient.  Please see medical record for radiology report.  ____________________________________________   PROCEDURES  Procedure(s) performed:  Procedures    Critical Care performed: no ____________________________________________   INITIAL IMPRESSION / ASSESSMENT AND PLAN / ED COURSE  Pertinent labs & imaging results that were available during my care of the patient were reviewed by me and considered in my medical decision making (see chart for details).   DDX: ACS, pericarditis, esophagitis, boerhaaves, pe, dissection, pna, bronchitis, costochondritis   [name] is a 55 y.o. who presents to the ED with presentation as  described above.  Patient appears clinically well but reporting chest pain identical to his previous MI.  He has had 2 catheterizations this year with in-stent stenosis.  States has been compliant with his  medication noted to be hypotensive here.  His abdominal exam soft and benign.  Does not seem consistent with dissection.  EKG with some nonspecific changes no STEMI criteria are clear depressions.  Will observe for serial enzymes.  Will reassess.  Clinical Course as of 08/20/21 2117  Tue Aug 20, 2021  2023 Patient's troponin did rise.  He still having 3 out of 10 chest pain and pressure that he feels is identical to his previous MI.  He has had 2 cardiac caths this year 1 most recently being in June with in-stent stenosis.  He is already on Brilinta we will give aspirin we will give Nitropaste.  As he still having discomfort with slightly rising troponin with his risk factors I do believe that observation in the hospital for further medical management is most appropriate. [PR]    Clinical Course User Index [PR] Merlyn Lot, MD    The patient was evaluated in Emergency Department today for the symptoms described in the history of present illness. He/she was evaluated in the context of the global COVID-19 pandemic, which necessitated consideration that the patient might be at risk for infection with the SARS-CoV-2 virus that causes COVID-19. Institutional protocols and algorithms that pertain to the evaluation of patients at risk for COVID-19 are in a state of rapid change based on information released by regulatory bodies including the CDC and federal and state organizations. These policies and algorithms were followed during the patient's care in the ED.  As part of my medical decision making, I reviewed the following data within the [facility] notes reviewed and incorporated, Labs reviewed, notes from prior ED visits and [place] Controlled Substance Database   ____________________________________________   FINAL CLINICAL IMPRESSION(S) / ED DIAGNOSES  Final diagnoses:  Chest pain, unspecified type      NEW MEDICATIONS STARTED DURING THIS VISIT:  New Prescriptions    No medications on file     Note:  This document was prepared using Dragon voice recognition software and may include unintentional dictation errors.    Merlyn Lot, MD 08/20/21 2117

## 2021-08-20 NOTE — ED Triage Notes (Addendum)
Pt in with co left sided chest pain that started today, hx of MI. States had stents placed 4/22. States symptoms are similar, states did vomit. Pt is on dialysis and had treatment yesterday.

## 2021-08-21 ENCOUNTER — Inpatient Hospital Stay
Admit: 2021-08-21 | Discharge: 2021-08-21 | Disposition: A | Discharge status: Home / Self Care | Payer: Medicare Other | Attending: Cardiology | Admitting: Cardiology

## 2021-08-21 DIAGNOSIS — E039 Hypothyroidism, unspecified: Secondary | ICD-10-CM | POA: Diagnosis not present

## 2021-08-21 DIAGNOSIS — E782 Mixed hyperlipidemia: Secondary | ICD-10-CM | POA: Diagnosis not present

## 2021-08-21 DIAGNOSIS — E1151 Type 2 diabetes mellitus with diabetic peripheral angiopathy without gangrene: Secondary | ICD-10-CM | POA: Diagnosis not present

## 2021-08-21 DIAGNOSIS — I10 Essential (primary) hypertension: Secondary | ICD-10-CM | POA: Diagnosis not present

## 2021-08-21 DIAGNOSIS — I071 Rheumatic tricuspid insufficiency: Secondary | ICD-10-CM | POA: Diagnosis not present

## 2021-08-21 DIAGNOSIS — N186 End stage renal disease: Secondary | ICD-10-CM | POA: Diagnosis not present

## 2021-08-21 DIAGNOSIS — R079 Chest pain, unspecified: Secondary | ICD-10-CM | POA: Diagnosis not present

## 2021-08-21 DIAGNOSIS — N250 Renal osteodystrophy: Secondary | ICD-10-CM | POA: Diagnosis not present

## 2021-08-21 DIAGNOSIS — Z833 Family history of diabetes mellitus: Secondary | ICD-10-CM | POA: Diagnosis not present

## 2021-08-21 DIAGNOSIS — Z886 Allergy status to analgesic agent status: Secondary | ICD-10-CM | POA: Diagnosis not present

## 2021-08-21 DIAGNOSIS — I200 Unstable angina: Secondary | ICD-10-CM | POA: Diagnosis not present

## 2021-08-21 DIAGNOSIS — I214 Non-ST elevation (NSTEMI) myocardial infarction: Secondary | ICD-10-CM | POA: Diagnosis not present

## 2021-08-21 DIAGNOSIS — Z888 Allergy status to other drugs, medicaments and biological substances status: Secondary | ICD-10-CM | POA: Diagnosis not present

## 2021-08-21 DIAGNOSIS — Z885 Allergy status to narcotic agent status: Secondary | ICD-10-CM | POA: Diagnosis not present

## 2021-08-21 DIAGNOSIS — Z20822 Contact with and (suspected) exposure to covid-19: Secondary | ICD-10-CM | POA: Diagnosis not present

## 2021-08-21 DIAGNOSIS — E785 Hyperlipidemia, unspecified: Secondary | ICD-10-CM | POA: Diagnosis not present

## 2021-08-21 DIAGNOSIS — E1121 Type 2 diabetes mellitus with diabetic nephropathy: Secondary | ICD-10-CM | POA: Diagnosis not present

## 2021-08-21 DIAGNOSIS — I272 Pulmonary hypertension, unspecified: Secondary | ICD-10-CM | POA: Diagnosis not present

## 2021-08-21 DIAGNOSIS — Z955 Presence of coronary angioplasty implant and graft: Secondary | ICD-10-CM | POA: Diagnosis not present

## 2021-08-21 DIAGNOSIS — R0789 Other chest pain: Secondary | ICD-10-CM | POA: Diagnosis not present

## 2021-08-21 DIAGNOSIS — Z992 Dependence on renal dialysis: Secondary | ICD-10-CM | POA: Diagnosis not present

## 2021-08-21 DIAGNOSIS — I120 Hypertensive chronic kidney disease with stage 5 chronic kidney disease or end stage renal disease: Secondary | ICD-10-CM | POA: Diagnosis not present

## 2021-08-21 DIAGNOSIS — E11628 Type 2 diabetes mellitus with other skin complications: Secondary | ICD-10-CM | POA: Diagnosis not present

## 2021-08-21 DIAGNOSIS — I2510 Atherosclerotic heart disease of native coronary artery without angina pectoris: Secondary | ICD-10-CM | POA: Diagnosis not present

## 2021-08-21 DIAGNOSIS — R778 Other specified abnormalities of plasma proteins: Secondary | ICD-10-CM | POA: Diagnosis not present

## 2021-08-21 DIAGNOSIS — Z841 Family history of disorders of kidney and ureter: Secondary | ICD-10-CM | POA: Diagnosis not present

## 2021-08-21 DIAGNOSIS — D631 Anemia in chronic kidney disease: Secondary | ICD-10-CM | POA: Diagnosis not present

## 2021-08-21 DIAGNOSIS — E11621 Type 2 diabetes mellitus with foot ulcer: Secondary | ICD-10-CM | POA: Diagnosis not present

## 2021-08-21 DIAGNOSIS — I25110 Atherosclerotic heart disease of native coronary artery with unstable angina pectoris: Secondary | ICD-10-CM | POA: Diagnosis not present

## 2021-08-21 DIAGNOSIS — E1122 Type 2 diabetes mellitus with diabetic chronic kidney disease: Secondary | ICD-10-CM | POA: Diagnosis not present

## 2021-08-21 DIAGNOSIS — G4700 Insomnia, unspecified: Secondary | ICD-10-CM | POA: Diagnosis not present

## 2021-08-21 DIAGNOSIS — T82855A Stenosis of coronary artery stent, initial encounter: Secondary | ICD-10-CM | POA: Diagnosis not present

## 2021-08-21 DIAGNOSIS — L97509 Non-pressure chronic ulcer of other part of unspecified foot with unspecified severity: Secondary | ICD-10-CM | POA: Diagnosis not present

## 2021-08-21 DIAGNOSIS — Z789 Other specified health status: Secondary | ICD-10-CM | POA: Diagnosis not present

## 2021-08-21 DIAGNOSIS — L299 Pruritus, unspecified: Secondary | ICD-10-CM | POA: Diagnosis not present

## 2021-08-21 DIAGNOSIS — E875 Hyperkalemia: Secondary | ICD-10-CM | POA: Diagnosis not present

## 2021-08-21 DIAGNOSIS — Z7982 Long term (current) use of aspirin: Secondary | ICD-10-CM | POA: Diagnosis not present

## 2021-08-21 DIAGNOSIS — N2581 Secondary hyperparathyroidism of renal origin: Secondary | ICD-10-CM | POA: Diagnosis not present

## 2021-08-21 LAB — RESP PANEL BY RT-PCR (FLU A&B, COVID) ARPGX2
Influenza A by PCR: NEGATIVE
Influenza B by PCR: NEGATIVE
SARS Coronavirus 2 by RT PCR: NEGATIVE

## 2021-08-21 MED ORDER — TICAGRELOR 90 MG PO TABS: 90.0000 mg | ORAL_TABLET | Freq: Two times a day (BID) | ORAL | Status: DC

## 2021-08-21 MED ORDER — ISOSORBIDE DINITRATE 20 MG PO TABS
20.0000 mg | ORAL_TABLET | Freq: Three times a day (TID) | ORAL | Status: DC
  Filled 2021-08-21 (×3): qty 1

## 2021-08-21 MED ORDER — HEPARIN SODIUM (PORCINE) 5000 UNIT/ML IJ SOLN
5000.0000 [IU] | Freq: Three times a day (TID) | INTRAMUSCULAR | Status: DC
  Administered 2021-08-21: 5000 [IU] via SUBCUTANEOUS
  Filled 2021-08-21: qty 1

## 2021-08-21 MED ORDER — AMLODIPINE BESYLATE 5 MG PO TABS: 10.0000 mg | ORAL_TABLET | Freq: Every day | ORAL | Status: DC

## 2021-08-21 MED ORDER — ASPIRIN EC 81 MG PO TBEC: 81.0000 mg | DELAYED_RELEASE_TABLET | Freq: Every day | ORAL | Status: DC

## 2021-08-21 MED ORDER — HYDRALAZINE HCL 50 MG PO TABS: 50.0000 mg | ORAL_TABLET | Freq: Three times a day (TID) | ORAL | Status: DC

## 2021-08-21 MED ORDER — METOPROLOL TARTRATE 25 MG PO TABS: 25.0000 mg | ORAL_TABLET | Freq: Two times a day (BID) | ORAL | Status: DC

## 2021-08-21 NOTE — ED Notes (Signed)
This pt refusing to be stuck for labs, IV does not draw back well enough for lab collection, pt states he wants labs drawn at HD, MD Posey Pronto and Corky Sox notified at this time. MD Corky Sox verbalized HD can draw labs when pt goes.

## 2021-08-21 NOTE — Consult Note (Signed)
[facility] Consult Note: 08/21/2021    Date of Admission:  08/20/2021           Reason for Consult: ESRD     Referring Provider: Fritzi Mandes, MD Primary Care Provider: [name], MD   History of Presenting Illness:  [name] is a 55 y.o. male with end-stage renal disease on hemodialysis since 2012.  He has medical problems of hypertension, coronary disease with history of 4 stents.  He still makes some urine maybe 1 time per day. He presented with chest pain that started yesterday.  His last dialysis was on Monday. He dialyzes via right arm AV fistula at [facility] clinic He feels like his clinic is taking extra fluid off of him. At present he does not have any shortness of breath or lower extremity edema   Review of Systems: ROS  Gen: Denies any fevers or chills HEENT: No vision or hearing problems CV: No chest pain or shortness of breath Resp: No cough or sputum production GI: No nausea, vomiting or diarrhea.  No blood in the stool GU : Void once per day. no hematuria.  No previous history of kidney problems MS:  Denies any acute joint pain or swelling Derm:   No complaints Psych: No complaints Heme: No complaints Neuro: No complaints Endocrine: No complaints     Past Medical History:  Diagnosis Date   Abscess of right foot    Acute osteomyelitis of right foot ([place]) 05/15/2019   Anemia 06/23/2019   Cellulitis of right foot 05/22/2019   Chest pain 08/23/2013   Chills    at night - sometimes   Complication of anesthesia    Coronary artery disease    Diabetes mellitus    controlled by diet   Diabetes with renal manifestations(250.4) 08/23/2013   Diabetic foot ulcer- right second toe 05/14/2019   Diabetic infection of right foot ([place])    Diabetic retinopathy associated with type 2 diabetes mellitus ([place]) 06/23/2019   Eczema    ESRD on hemodialysis ([place]) 07/18/2011   Essential hypertension 10/18/2007   Qualifier: Diagnosis of   By: Loanne Drilling MD, Sean A    Fatigue    loss of fatigue   FOOT ULCER, LEFT 06/29/2008   Qualifier: Diagnosis of  By: Loanne Drilling MD, Sean A    Gangrene of toe of right foot ([place])    Headache(784.0)    Years ago   Heart murmur    years ago   History of blood transfusion    Hyperglycemia 05/05/2015   HYPERLIPIDEMIA 05/16/2008   Qualifier: Diagnosis of  By: Jenny Reichmann MD, Hunt Oris    Hypoglycemia, unspecified 11/13/2008   Qualifier: Diagnosis of  By: Jenny Reichmann MD, Hunt Oris    MRSA (methicillin resistant staph aureus) culture positive    MRSA INFECTION 10/18/2007   Qualifier: Diagnosis of  By: Marca Ancona RMA, Lucy     Myocardial infarction ([place])    Obesity, unspecified 08/23/2013   Osteomyelitis of great toe of right foot ([place]) 05/14/2019   Other forms of retinal detachment(361.89) 06/23/2019   PONV (postoperative nausea and vomiting)    Poor circulation    Secondary hyperparathyroidism ([place]) 06/23/2019   Sepsis ([place]) 05/14/2019   Type 2 diabetes mellitus ([place]) 08/23/2013   Ulcer of right foot, with necrosis of bone ([place])    Ulcer of right second toe with necrosis of bone ([place])     Social History   Tobacco Use   Smoking status: Never   Smokeless  tobacco: Never  Vaping Use   Vaping Use: Never used  Substance Use Topics   Alcohol use: No   Drug use: No    Family History  Problem Relation Age of Onset   Diabetes Mother      OBJECTIVE: Blood pressure (!) 187/91, pulse 77, temperature 98.3 F (36.8 C), temperature source Oral, resp. rate 18, height 5\' 11"  (1.803 m), weight 84 kg, SpO2 100 %.  Physical Exam    Physical Exam: General:  No acute distress, laying in the bed  HEENT  anicteric, moist oral mucous membrane  Pulm/lungs  normal breathing effort, lungs are clear to auscultation  CVS/Heart  regular rhythm, no rub or gallop  Abdomen:   Soft, nontender  Extremities:  No peripheral edema  Neurologic:  Alert, oriented, able to follow commands  Skin:  No acute rashes  Right arm AV fistula with  good bruit and thrill    Lab Results Lab Results  Component Value Date   WBC 5.7 08/20/2021   HGB 13.4 08/20/2021   HCT 41.1 08/20/2021   MCV 88.4 08/20/2021   PLT 242 08/20/2021    Lab Results  Component Value Date   CREATININE 9.68 (H) 08/20/2021   BUN 62 (H) 08/20/2021   NA 136 08/20/2021   K 5.3 (H) 08/20/2021   CL 93 (L) 08/20/2021   CO2 26 08/20/2021    Lab Results  Component Value Date   ALT 14 08/20/2021   AST 19 08/20/2021   ALKPHOS 94 08/20/2021   BILITOT 0.8 08/20/2021     Microbiology: Recent Results (from the past 240 hour(s))  Resp Panel by RT-PCR (Flu A&B, Covid) Nasopharyngeal Swab     Status: None   Collection Time: 08/21/21 12:17 AM   Specimen: Nasopharyngeal Swab; Nasopharyngeal(NP) swabs in vial transport medium  Result Value Ref Range Status   SARS Coronavirus 2 by RT PCR NEGATIVE NEGATIVE Final    Comment: (NOTE) SARS-CoV-2 target nucleic acids are NOT DETECTED.  The SARS-CoV-2 RNA is generally detectable in upper respiratory specimens during the acute phase of infection. The lowest concentration of SARS-CoV-2 viral copies this assay can detect is 138 copies/mL. A negative result does not preclude SARS-Cov-2 infection and should not be used as the sole basis for treatment or other patient management decisions. A negative result may occur with  improper specimen collection/handling, submission of specimen other than nasopharyngeal swab, presence of viral mutation(s) within the areas targeted by this assay, and inadequate number of viral copies(<138 copies/mL). A negative result must be combined with clinical observations, patient history, and epidemiological information. The expected result is Negative.  Fact Sheet for Patients:  EntrepreneurPulse.com.au  Fact Sheet for Healthcare Providers:  IncredibleEmployment.be  This test is no t yet approved or cleared by the [place] FDA and  has been  authorized for detection and/or diagnosis of SARS-CoV-2 by FDA under an Emergency Use Authorization (EUA). This EUA will remain  in effect (meaning this test can be used) for the duration of the COVID-19 declaration under Section 564(b)(1) of the Act, 21 U.S.C.section 360bbb-3(b)(1), unless the authorization is terminated  or revoked sooner.       Influenza A by PCR NEGATIVE NEGATIVE Final   Influenza B by PCR NEGATIVE NEGATIVE Final    Comment: (NOTE) The Xpert Xpress SARS-CoV-2/FLU/RSV plus assay is intended as an aid in the diagnosis of influenza from Nasopharyngeal swab specimens and should not be used as a sole basis for treatment. Nasal washings and aspirates are  unacceptable for Xpert Xpress SARS-CoV-2/FLU/RSV testing.  Fact Sheet for Patients: EntrepreneurPulse.com.au  Fact Sheet for Healthcare Providers: IncredibleEmployment.be  This test is not yet approved or cleared by the [place] FDA and has been authorized for detection and/or diagnosis of SARS-CoV-2 by FDA under an Emergency Use Authorization (EUA). This EUA will remain in effect (meaning this test can be used) for the duration of the COVID-19 declaration under Section 564(b)(1) of the Act, 21 U.S.C. section 360bbb-3(b)(1), unless the authorization is terminated or revoked.  Performed at [facility], [address] [place], [place] [address]     Medications: Scheduled Meds:  amLODipine  10 mg Oral Once   amLODipine  10 mg Oral QHS   aspirin  324 mg Oral Once   aspirin EC  81 mg Oral Daily   heparin injection (subcutaneous)  5,000 Units Subcutaneous Q8H   hydrALAZINE  50 mg Oral Once   hydrALAZINE  50 mg Oral TID   isosorbide dinitrate  20 mg Oral TID   metoprolol tartrate  25 mg Oral BID   pantoprazole (PROTONIX) IV  40 mg Intravenous Q24H   sevelamer carbonate  1,600 mg Oral TID WC   ticagrelor  90 mg Oral BID   Continuous Infusions: PRN  Meds:.nitroGLYCERIN  Allergies  Allergen Reactions   Atorvastatin     Other reaction(s): Muscle Pain   Rosuvastatin     Other reaction(s): Muscle Pain   Amoxicillin Itching    Did it involve swelling of the face/tongue/throat, SOB, or low BP? Unknown Did it involve sudden or severe rash/hives, skin peeling, or any reaction on the inside of your mouth or nose? Unknown Did you need to seek medical attention at a hospital or doctor's office? Unknown When did it last happen?      20 years ago If all above answers are "NO", may proceed with cephalosporin use.    Chlorhexidine Hives    Patient has blistering   Hydromorphone Itching    Patient states he may take with benadryl. Makes feet itch.   Percocet [Oxycodone-Acetaminophen] Itching and Other (See Comments)    Legs only. Itching and burning feeling.   Gabapentin Nausea And Vomiting    Stomach issues     Urinalysis: No results for input(s): COLORURINE, LABSPEC, PHURINE, GLUCOSEU, HGBUR, BILIRUBINUR, KETONESUR, PROTEINUR, UROBILINOGEN, NITRITE, LEUKOCYTESUR in the last 72 hours.  Invalid input(s): APPERANCEUR    Imaging: DG Chest 2 View  Result Date: 08/20/2021 CLINICAL DATA:  Chest pain EXAM: CHEST - 2 VIEW COMPARISON:  02/10/2021 FINDINGS: A vascular stent is identified in the expected location of the right subclavian vein. Normal heart size. No pleural effusion or edema. No airspace opacities identified. The visualized osseous structures are unremarkable. IMPRESSION: No active cardiopulmonary abnormalities. Electronically Signed   By: Kerby Moors M.D.   On: 08/20/2021 16:35      Assessment/Plan:  [name] is a 55 y.o. male with medical problems of hypertension, coronary disease, end-stage renal disease on dialysis since 2012  was admitted on 08/20/2021 for :  Unstable angina ([place]) [I20.0]  #End-stage renal disease [facility] Monday Wednesday Friday Plan for hemodialysis today Fluid  removal based on today's standing weight and dry weight of 84.5 kg Patient reports that he does not usually get heparin during dialysis Orders placed and dialysis staff has been alerted  #Anemia of chronic kidney disease Last hemoglobin 13.4 No indication for Epogen therapy at present  #Secondary hyperparathyroidism Monitor calcium and phosphorus during  this hospitalization  #Chest pain/unstable angina Continued on antiplatelet therapy with aspirin, Brilinta Management as per cardiac team   Praise Dolecki 08/21/21

## 2021-08-21 NOTE — ED Notes (Signed)
Pt declined DC vitals

## 2021-08-21 NOTE — Consult Note (Signed)
[facility]  CARDIOLOGY CONSULT NOTE  Patient ID: [name] MRN: [id number] DOB/AGE: 04-15-1966 55 y.o.  Admit date: 08/20/2021 Referring Physician Fritzi Mandes Primary Physician [name], MD Primary Cardiologist Ernesta Amble Reason for Consultation Chest pain, elevated troponin, dialysis patient  HPI:  [name] is a 55 year old male with history of multivessel CAD (s/p MI x3, most recent 10/08/2020 with DES to D2, mid LAD, PDA; ISR 03/2021 s/p DES to mLAD), ESRD, HLD with statin intolerance, and hypertension who presents the hospital with chest pain.  In the setting troponin is mildly elevated.  Cardiology is consulted for further evaluation.  He was doing ok until Friday when he was driving home and he felt his heart fluttering for 10-15 seconds, and this recurred again that evening. He had an appointment yesterday and felt fine then. Yesterday evening he had gas like pain after eating a fish sandwich; eventually started having increased pain. He was driving and this worsened and he was throwing up like crazy. He called EMS. He was given NTG with improvement in his pain.  He had recurrence in his pain after eating a chicken sandwich last night.  This was again improved with nitroglycerin.  He has been going to HD recently like he is supposed to. He says that they are pulling to much fluid and it makes him have chest discomfort.   On arrival to the emergency department his heart rate has been 70s to 80s, blood pressures been elevated to as high as 193/105.  Troponin was elevated to 23, and increased to 36 on repeat.  Review of systems complete and found to be negative unless listed above     Past Medical History:  Diagnosis Date   Abscess of right foot    Acute osteomyelitis of right foot ([place]) 05/15/2019   Anemia 06/23/2019   Cellulitis of right foot 05/22/2019   Chest pain 08/23/2013   Chills    at night - sometimes   Complication of anesthesia    Coronary artery disease     Diabetes mellitus    controlled by diet   Diabetes with renal manifestations(250.4) 08/23/2013   Diabetic foot ulcer- right second toe 05/14/2019   Diabetic infection of right foot ([place])    Diabetic retinopathy associated with type 2 diabetes mellitus ([place]) 06/23/2019   Eczema    ESRD on hemodialysis ([place]) 07/18/2011   Essential hypertension 10/18/2007   Qualifier: Diagnosis of  By: Loanne Drilling MD, Sean A    Fatigue    loss of fatigue   FOOT ULCER, LEFT 06/29/2008   Qualifier: Diagnosis of  By: Loanne Drilling MD, Sean A    Gangrene of toe of right foot ([place])    Headache(784.0)    Years ago   Heart murmur    years ago   History of blood transfusion    Hyperglycemia 05/05/2015   HYPERLIPIDEMIA 05/16/2008   Qualifier: Diagnosis of  By: Jenny Reichmann MD, Hunt Oris    Hypoglycemia, unspecified 11/13/2008   Qualifier: Diagnosis of  By: Jenny Reichmann MD, Hunt Oris    MRSA (methicillin resistant staph aureus) culture positive    MRSA INFECTION 10/18/2007   Qualifier: Diagnosis of  By: Marca Ancona RMA, Lucy     Myocardial infarction ([place])    Obesity, unspecified 08/23/2013   Osteomyelitis of great toe of right foot ([place]) 05/14/2019   Other forms of retinal detachment(361.89) 06/23/2019   PONV (postoperative nausea and vomiting)    Poor circulation    Secondary hyperparathyroidism ([place]) 06/23/2019  Sepsis ([place]) 05/14/2019   Type 2 diabetes mellitus ([place]) 08/23/2013   Ulcer of right foot, with necrosis of bone ([place])    Ulcer of right second toe with necrosis of bone [name])     Past Surgical History:  Procedure Laterality Date   ABDOMINAL AORTOGRAM N/A 05/17/2019   Procedure: ABDOMINAL AORTOGRAM;  Surgeon: Elam Dutch, MD;  Location: [place] CV LAB;  Service: Cardiovascular;  Laterality: N/A;   AMPUTATION TOE Right 05/14/2019   Procedure: PARTIAL AMPUTATION SECOND TOE RIGHT FOOT;  Surgeon: Evelina Bucy, DPM;  Location: [place];  Service: Podiatry;  Laterality: Right;   AMPUTATION TOE Right 05/23/2019   Procedure: AMPUTATION  OF SECOND TOE METATARSAL PHALANGEAL JOINT;  Surgeon: Evelina Bucy, DPM;  Location: [place];  Service: Podiatry;  Laterality: Right;   AMPUTATION TOE Right 05/27/2019   Procedure: Amputation Toe;  Surgeon: Evelina Bucy, DPM;  Location: [place];  Service: Podiatry;  Laterality: Right;  right third toe   APPLICATION OF WOUND VAC Right 05/27/2019   Procedure: Application Of Wound Vac;  Surgeon: Evelina Bucy, DPM;  Location: [place];  Service: Podiatry;  Laterality: Right;   AV FISTULA PLACEMENT  06-18-11   Right brachiocephalic AVF   BONE BIOPSY Right 05/14/2019   Procedure: OPEN SUPERFICIAL BONE BIOPSY GREAT TOE;  Surgeon: Evelina Bucy, DPM;  Location: [place];  Service: Podiatry;  Laterality: Right;   CARDIAC CATHETERIZATION     CORONARY BALLOON ANGIOPLASTY N/A 09/24/2020   Procedure: CORONARY BALLOON ANGIOPLASTY;  Surgeon: Martinique, Peter M, MD;  Location: [place] CV LAB;  Service: Cardiovascular;  Laterality: N/A;   CORONARY STENT INTERVENTION N/A 09/24/2020   Procedure: CORONARY STENT INTERVENTION;  Surgeon: Martinique, Peter M, MD;  Location: [place] CV LAB;  Service: Cardiovascular;  Laterality: N/A;   EYE SURGERY     left eye for Laser, diabetic retinopathy   FISTULOGRAM Right 11/06/2011   Procedure: FISTULOGRAM;  Surgeon: Conrad Unalakleet, MD;  Location: [facility] C[facility] LAB;  Service: Cardiovascular;  Laterality: Right;   HEMATOMA EVACUATION Right Feb. 25, 2014   HEMATOMA EVACUATION Right 12/14/2012   Procedure: EVACUATION HEMATOMA;  Surgeon: Conrad Milford, MD;  Location: [place];  Service: Vascular;  Laterality: Right;   INCISION AND DRAINAGE Right 05/23/2019   Procedure: INCISION AND DRAINAGE OF RIGHT FOOT DEEP SPACE ABSCESS;  Surgeon: Evelina Bucy, DPM;  Location: [place];  Service: Podiatry;  Laterality: Right;   INSERTION OF DIALYSIS CATHETER  12/14/2012   Procedure: INSERTION OF DIALYSIS CATHETER;  Surgeon: Conrad Northumberland, MD;  Location: [place];  Service: Vascular;;   INSERTION OF DIALYSIS CATHETER  Right 01/07/2021   Procedure: INSERTION OF DIALYSIS CATHETER;  Surgeon: Elam Dutch, MD;  Location: [place];  Service: Vascular;  Laterality: Right;   INSERTION OF DIALYSIS CATHETER Left 01/08/2021   Procedure: INSERTION OF LEFT INTERNAL JUGULAR DIALYSIS CATHETER, tunneled;  Surgeon: Angelia Mould, MD;  Location: [place];  Service: Vascular;  Laterality: Left;   INTRAVASCULAR PRESSURE WIRE/FFR STUDY N/A 09/24/2020   Procedure: INTRAVASCULAR PRESSURE WIRE/FFR STUDY;  Surgeon: Martinique, Peter M, MD;  Location: [place] CV LAB;  Service: Cardiovascular;  Laterality: N/A;   IRRIGATION AND DEBRIDEMENT FOOT Right 05/25/2019   Procedure: INCISION AND DRAINAGE FOOT;  Surgeon: Evelina Bucy, DPM;  Location: [place];  Service: Podiatry;  Laterality: Right;   IRRIGATION AND DEBRIDEMENT FOOT Right 05/27/2019   Procedure: IRRIGATION AND DEBRIDEMENT FOOT;  Surgeon: Hardie Pulley  J, DPM;  Location: [place];  Service: Podiatry;  Laterality: Right;   LEFT HEART CATH AND CORONARY ANGIOGRAPHY N/A 09/24/2020   Procedure: LEFT HEART CATH AND CORONARY ANGIOGRAPHY;  Surgeon: Martinique, Peter M, MD;  Location: [place] CV [facility];  Service: Cardiovascular;  Laterality: N/A;   LOWER EXTREMITY ANGIOGRAPHY Bilateral 05/17/2019   Procedure: LOWER EXTREMITY ANGIOGRAPHY;  Surgeon: Elam Dutch, MD;  Location: [place] CV LAB;  Service: Cardiovascular;  Laterality: Bilateral;   REMOVAL OF A DIALYSIS CATHETER Right 01/08/2021   Procedure: REMOVAL OF TEMPORARY DIALYSIS CATHETER, LEFT FEMORAL VEIN;  Surgeon: Angelia Mould, MD;  Location[facility];  Service: Vascular;  Laterality: Right;   REVISON OF ARTERIOVENOUS FISTULA Right 12/14/2012   Procedure: REVISON OF upper arm ARTERIOVENOUS FISTULA using 55mmx10cm gortex graft;  Surgeon: Conrad La Parguera, MD;  Location: [place];  Service: Vascular;  Laterality: Right;   REVISON OF ARTERIOVENOUS FISTULA Right 12/27/2020   Procedure: RIGHT UPPER EXTREMITY ARTERIOVENOUS FISTULA  REVISiON;  Surgeon: Cherre Robins, MD;  Location: [facility];  Service: Vascular;  Laterality: Right;  PERIPHERAL NERVE BLOCK   RIGHT/LEFT HEART CATH AND CORONARY ANGIOGRAPHY N/A 08/07/2020   Procedure: RIGHT/LEFT HEART CATH AND CORONARY ANGIOGRAPHY;  Surgeon: Martinique, Peter M, MD;  Location: [place] [facility];  Service: Cardiovascular;  Laterality: N/A;   SOFT TISSUE MASS EXCISION     Right arm, Left leg  for MRSA infection    (Not in a hospital admission)  Social History   Socioeconomic History   Marital status: Married    Spouse name: Not on file   Number of children: 2   Years of education: 14   Highest education level: Not on file  Occupational History   Occupation: Disabled  Tobacco Use   Smoking status: Never   Smokeless tobacco: Never  Vaping Use   Vaping Use: Never used  Substance and Sexual Activity   Alcohol use: No   Drug use: No   Sexual activity: Yes  Other Topics Concern   Not on file  Social History Narrative   Not on file   Social Determinants of Health   Financial Resource Strain: Not on file  Food Insecurity: Not on file  Transportation Needs: Not on file  Physical Activity: Not on file  Stress: Not on file  Social Connections: Not on file  Intimate Partner Violence: Not on file    Family History  Problem Relation Age of Onset   Diabetes Mother       Review of systems complete and found to be negative unless listed above      PHYSICAL EXAM  General: Well developed, well nourished, in no acute distress HEENT:  Normocephalic and atramatic Neck:  No JVD.  Lungs: Clear bilaterally to auscultation and percussion. Heart: HRRR . Normal S1 and S2 without gallops or murmurs.  Abdomen: Bowel sounds are positive, abdomen soft and non-tender  Msk:  Back normal, normal gait. Normal strength and tone for age. Extremities: No clubbing, cyanosis or edema.  S/p right TMA. Neuro: Alert and oriented X 3. Psych:  Good affect, responds  appropriately  Labs:   Lab Results  Component Value Date   WBC 5.7 08/20/2021   HGB 13.4 08/20/2021   HCT 41.1 08/20/2021   MCV 88.4 08/20/2021   PLT 242 08/20/2021    Recent Labs  Lab 08/20/21 1655  NA 136  K 5.3*  CL 93*  CO2 26  BUN 62*  CREATININE 9.68*  CALCIUM 9.3  PROT 7.6  BILITOT 0.8  ALKPHOS 94  ALT 14  AST 19  GLUCOSE 115*   Lab Results  Component Value Date   CKTOTAL 45 06/14/2011   CKMB 2.7 06/14/2011   TROPONINI <0.03 05/05/2015    Lab Results  Component Value Date   CHOL 132 09/22/2020   CHOL 167 08/07/2020   CHOL 231 (H) 05/15/2019   Lab Results  Component Value Date   HDL 69 09/22/2020   HDL 91 08/07/2020   HDL 43 05/15/2019   Lab Results  Component Value Date   LDLCALC 48 09/22/2020   LDLCALC 61 08/07/2020   LDLCALC 141 (H) 05/15/2019   Lab Results  Component Value Date   TRIG 73 09/22/2020   TRIG 73 08/07/2020   TRIG 236 (H) 05/15/2019   Lab Results  Component Value Date   CHOLHDL 1.9 09/22/2020   CHOLHDL 1.8 08/07/2020   CHOLHDL 5.4 05/15/2019   Lab Results  Component Value Date   LDLDIRECT 166.1 11/13/2008   LDLDIRECT 83.6 07/31/2008   LDLDIRECT 130.0 05/16/2008      Radiology: DG Chest 2 View  Result Date: 08/20/2021 CLINICAL DATA:  Chest pain EXAM: CHEST - 2 VIEW COMPARISON:  02/10/2021 FINDINGS: A vascular stent is identified in the expected location of the right subclavian vein. Normal heart size. No pleural effusion or edema. No airspace opacities identified. The visualized osseous structures are unremarkable. IMPRESSION: No active cardiopulmonary abnormalities. Electronically Signed   By: Kerby Moors M.D.   On: 08/20/2021 16:35    EKG: NSR.  LVH with repolarization abnormalities.  Cath 09/2020- 1. Severe 3 vessel obstructive CAD. Involves primarily branch and distal vessels. Anatomy really unchanged from October. The culprit vessel is the second OM. 2. Moderately elevated LVEDP 27 mm Hg 3. Unsuccessful PCI  of the second diagonal. This vessel is small and tortuous. Expanded with balloon with some improvement in flow but lesion unyielding.  4. Successful PCI of the second OM with DES x 1  ASSESSMENT AND PLAN:  [name] is a 55 year old male with history of multivessel CAD (s/p MI x3, most recent 10/08/2020 with DES to D2, mid LAD, PDA; ISR 03/2021 s/p DES to mLAD), ESRD, HLD with statin intolerance, and hypertension who presents the hospital with chest pain.  In the setting troponin is mildly elevated.  Cardiology is consulted for further evaluation.  # Chest pain, elevated troponin # Multivessel CAD (last DES 03/2021) # ESRD on HD # Hypertension Multiple MIs in the past with at least 4 stents placed, most recently in June 2022.  He is extremely calcified and tortuous vessels. He presents now with atypical pain that started after eating a fish sandwich and was predominantly associated with vomiting.  His pain recurred in the emergency department again only after eating a chicken sandwich.  His troponin was mildly elevated, but in the setting of ESRD this is not as sensitive.  -Continue aspirin and Brilinta for at least 12 months from previous stent -Please continue to trend troponin until peak. -Continue home metoprolol -Continue home hydralazine/Isordil -Continue home amlodipine -HD per inpatient team -Repeat echocardiogram. -Pending the above evaluation we will consider ischemic evaluation, though given his extensive history of CAD would favor conservative approach if possible.  Signed: Andrez Grime MD 08/21/2021, 8:07 AM

## 2021-08-21 NOTE — ED Notes (Signed)
This RN entered this pt's room upon seeing call bell lit. This pt reports they want to sign out AMA and go to [facility]. MDs Anette Riedel notified at this time.

## 2021-08-26 ENCOUNTER — Encounter (INDEPENDENT_AMBULATORY_CARE_PROVIDER_SITE_OTHER): Payer: Self-pay

## 2021-08-26 DIAGNOSIS — H25011 Cortical age-related cataract, right eye: Secondary | ICD-10-CM | POA: Diagnosis not present

## 2021-08-26 DIAGNOSIS — N2581 Secondary hyperparathyroidism of renal origin: Secondary | ICD-10-CM | POA: Diagnosis not present

## 2021-08-26 DIAGNOSIS — L03116 Cellulitis of left lower limb: Secondary | ICD-10-CM | POA: Diagnosis not present

## 2021-08-26 DIAGNOSIS — I96 Gangrene, not elsewhere classified: Secondary | ICD-10-CM | POA: Diagnosis not present

## 2021-08-26 DIAGNOSIS — Z992 Dependence on renal dialysis: Secondary | ICD-10-CM | POA: Diagnosis not present

## 2021-08-26 DIAGNOSIS — D631 Anemia in chronic kidney disease: Secondary | ICD-10-CM | POA: Diagnosis not present

## 2021-08-26 DIAGNOSIS — N186 End stage renal disease: Secondary | ICD-10-CM | POA: Diagnosis not present

## 2021-08-26 DIAGNOSIS — H2511 Age-related nuclear cataract, right eye: Secondary | ICD-10-CM | POA: Diagnosis not present

## 2021-08-28 DIAGNOSIS — E1129 Type 2 diabetes mellitus with other diabetic kidney complication: Secondary | ICD-10-CM | POA: Diagnosis not present

## 2021-08-28 DIAGNOSIS — S90425A Blister (nonthermal), left lesser toe(s), initial encounter: Secondary | ICD-10-CM | POA: Diagnosis not present

## 2021-08-28 DIAGNOSIS — Z992 Dependence on renal dialysis: Secondary | ICD-10-CM | POA: Diagnosis not present

## 2021-08-28 DIAGNOSIS — N186 End stage renal disease: Secondary | ICD-10-CM | POA: Diagnosis not present

## 2021-08-28 DIAGNOSIS — L97521 Non-pressure chronic ulcer of other part of left foot limited to breakdown of skin: Secondary | ICD-10-CM | POA: Diagnosis not present

## 2021-08-28 DIAGNOSIS — E1159 Type 2 diabetes mellitus with other circulatory complications: Secondary | ICD-10-CM | POA: Diagnosis not present

## 2021-08-28 DIAGNOSIS — S90122A Contusion of left lesser toe(s) without damage to nail, initial encounter: Secondary | ICD-10-CM | POA: Diagnosis not present

## 2021-08-31 DIAGNOSIS — L03116 Cellulitis of left lower limb: Secondary | ICD-10-CM | POA: Diagnosis present

## 2021-08-31 DIAGNOSIS — E875 Hyperkalemia: Secondary | ICD-10-CM | POA: Diagnosis present

## 2021-08-31 DIAGNOSIS — M7989 Other specified soft tissue disorders: Secondary | ICD-10-CM | POA: Diagnosis not present

## 2021-08-31 DIAGNOSIS — G546 Phantom limb syndrome with pain: Secondary | ICD-10-CM | POA: Diagnosis not present

## 2021-08-31 DIAGNOSIS — E782 Mixed hyperlipidemia: Secondary | ICD-10-CM | POA: Diagnosis present

## 2021-08-31 DIAGNOSIS — Z992 Dependence on renal dialysis: Secondary | ICD-10-CM | POA: Diagnosis not present

## 2021-08-31 DIAGNOSIS — L97529 Non-pressure chronic ulcer of other part of left foot with unspecified severity: Secondary | ICD-10-CM | POA: Diagnosis not present

## 2021-08-31 DIAGNOSIS — Z89431 Acquired absence of right foot: Secondary | ICD-10-CM | POA: Diagnosis not present

## 2021-08-31 DIAGNOSIS — I252 Old myocardial infarction: Secondary | ICD-10-CM | POA: Diagnosis not present

## 2021-08-31 DIAGNOSIS — L089 Local infection of the skin and subcutaneous tissue, unspecified: Secondary | ICD-10-CM | POA: Diagnosis not present

## 2021-08-31 DIAGNOSIS — I071 Rheumatic tricuspid insufficiency: Secondary | ICD-10-CM | POA: Diagnosis present

## 2021-08-31 DIAGNOSIS — E11649 Type 2 diabetes mellitus with hypoglycemia without coma: Secondary | ICD-10-CM | POA: Diagnosis not present

## 2021-08-31 DIAGNOSIS — I517 Cardiomegaly: Secondary | ICD-10-CM | POA: Diagnosis not present

## 2021-08-31 DIAGNOSIS — L97509 Non-pressure chronic ulcer of other part of unspecified foot with unspecified severity: Secondary | ICD-10-CM | POA: Diagnosis not present

## 2021-08-31 DIAGNOSIS — N186 End stage renal disease: Secondary | ICD-10-CM | POA: Diagnosis present

## 2021-08-31 DIAGNOSIS — Z9889 Other specified postprocedural states: Secondary | ICD-10-CM | POA: Diagnosis not present

## 2021-08-31 DIAGNOSIS — Z955 Presence of coronary angioplasty implant and graft: Secondary | ICD-10-CM | POA: Diagnosis not present

## 2021-08-31 DIAGNOSIS — M869 Osteomyelitis, unspecified: Secondary | ICD-10-CM | POA: Diagnosis not present

## 2021-08-31 DIAGNOSIS — M79672 Pain in left foot: Secondary | ICD-10-CM | POA: Diagnosis not present

## 2021-08-31 DIAGNOSIS — I7589 Atheroembolism of other site: Secondary | ICD-10-CM | POA: Diagnosis present

## 2021-08-31 DIAGNOSIS — I272 Pulmonary hypertension, unspecified: Secondary | ICD-10-CM | POA: Diagnosis present

## 2021-08-31 DIAGNOSIS — I509 Heart failure, unspecified: Secondary | ICD-10-CM | POA: Diagnosis present

## 2021-08-31 DIAGNOSIS — I120 Hypertensive chronic kidney disease with stage 5 chronic kidney disease or end stage renal disease: Secondary | ICD-10-CM | POA: Diagnosis not present

## 2021-08-31 DIAGNOSIS — B952 Enterococcus as the cause of diseases classified elsewhere: Secondary | ICD-10-CM | POA: Diagnosis not present

## 2021-08-31 DIAGNOSIS — I96 Gangrene, not elsewhere classified: Secondary | ICD-10-CM | POA: Diagnosis not present

## 2021-08-31 DIAGNOSIS — I10 Essential (primary) hypertension: Secondary | ICD-10-CM | POA: Diagnosis not present

## 2021-08-31 DIAGNOSIS — N184 Chronic kidney disease, stage 4 (severe): Secondary | ICD-10-CM | POA: Diagnosis not present

## 2021-08-31 DIAGNOSIS — E1152 Type 2 diabetes mellitus with diabetic peripheral angiopathy with gangrene: Secondary | ICD-10-CM | POA: Diagnosis present

## 2021-08-31 DIAGNOSIS — E1122 Type 2 diabetes mellitus with diabetic chronic kidney disease: Secondary | ICD-10-CM | POA: Diagnosis not present

## 2021-08-31 DIAGNOSIS — D631 Anemia in chronic kidney disease: Secondary | ICD-10-CM | POA: Diagnosis present

## 2021-08-31 DIAGNOSIS — Z20822 Contact with and (suspected) exposure to covid-19: Secondary | ICD-10-CM | POA: Diagnosis present

## 2021-08-31 DIAGNOSIS — G8918 Other acute postprocedural pain: Secondary | ICD-10-CM | POA: Diagnosis not present

## 2021-08-31 DIAGNOSIS — I739 Peripheral vascular disease, unspecified: Secondary | ICD-10-CM | POA: Diagnosis not present

## 2021-08-31 DIAGNOSIS — E039 Hypothyroidism, unspecified: Secondary | ICD-10-CM | POA: Diagnosis present

## 2021-08-31 DIAGNOSIS — Z7902 Long term (current) use of antithrombotics/antiplatelets: Secondary | ICD-10-CM | POA: Diagnosis not present

## 2021-08-31 DIAGNOSIS — B964 Proteus (mirabilis) (morganii) as the cause of diseases classified elsewhere: Secondary | ICD-10-CM | POA: Diagnosis not present

## 2021-08-31 DIAGNOSIS — M868X7 Other osteomyelitis, ankle and foot: Secondary | ICD-10-CM | POA: Diagnosis present

## 2021-08-31 DIAGNOSIS — E1169 Type 2 diabetes mellitus with other specified complication: Secondary | ICD-10-CM | POA: Diagnosis present

## 2021-08-31 DIAGNOSIS — N250 Renal osteodystrophy: Secondary | ICD-10-CM | POA: Diagnosis present

## 2021-08-31 DIAGNOSIS — N2581 Secondary hyperparathyroidism of renal origin: Secondary | ICD-10-CM | POA: Diagnosis not present

## 2021-08-31 DIAGNOSIS — E11621 Type 2 diabetes mellitus with foot ulcer: Secondary | ICD-10-CM | POA: Diagnosis not present

## 2021-08-31 DIAGNOSIS — I2510 Atherosclerotic heart disease of native coronary artery without angina pectoris: Secondary | ICD-10-CM | POA: Diagnosis present

## 2021-08-31 DIAGNOSIS — R079 Chest pain, unspecified: Secondary | ICD-10-CM | POA: Diagnosis not present

## 2021-08-31 DIAGNOSIS — I132 Hypertensive heart and chronic kidney disease with heart failure and with stage 5 chronic kidney disease, or end stage renal disease: Secondary | ICD-10-CM | POA: Diagnosis present

## 2021-09-01 DIAGNOSIS — Z992 Dependence on renal dialysis: Secondary | ICD-10-CM | POA: Diagnosis not present

## 2021-09-01 DIAGNOSIS — I7589 Atheroembolism of other site: Secondary | ICD-10-CM | POA: Diagnosis present

## 2021-09-01 DIAGNOSIS — Z7902 Long term (current) use of antithrombotics/antiplatelets: Secondary | ICD-10-CM | POA: Diagnosis not present

## 2021-09-01 DIAGNOSIS — L03116 Cellulitis of left lower limb: Secondary | ICD-10-CM | POA: Diagnosis present

## 2021-09-01 DIAGNOSIS — Z955 Presence of coronary angioplasty implant and graft: Secondary | ICD-10-CM | POA: Diagnosis not present

## 2021-09-01 DIAGNOSIS — I071 Rheumatic tricuspid insufficiency: Secondary | ICD-10-CM | POA: Diagnosis present

## 2021-09-01 DIAGNOSIS — N250 Renal osteodystrophy: Secondary | ICD-10-CM | POA: Diagnosis present

## 2021-09-01 DIAGNOSIS — I272 Pulmonary hypertension, unspecified: Secondary | ICD-10-CM | POA: Diagnosis present

## 2021-09-01 DIAGNOSIS — E1122 Type 2 diabetes mellitus with diabetic chronic kidney disease: Secondary | ICD-10-CM | POA: Diagnosis not present

## 2021-09-01 DIAGNOSIS — I252 Old myocardial infarction: Secondary | ICD-10-CM | POA: Diagnosis not present

## 2021-09-01 DIAGNOSIS — M79672 Pain in left foot: Secondary | ICD-10-CM | POA: Diagnosis not present

## 2021-09-01 DIAGNOSIS — I509 Heart failure, unspecified: Secondary | ICD-10-CM | POA: Diagnosis present

## 2021-09-01 DIAGNOSIS — I132 Hypertensive heart and chronic kidney disease with heart failure and with stage 5 chronic kidney disease, or end stage renal disease: Secondary | ICD-10-CM | POA: Diagnosis present

## 2021-09-01 DIAGNOSIS — Z20822 Contact with and (suspected) exposure to covid-19: Secondary | ICD-10-CM | POA: Diagnosis present

## 2021-09-01 DIAGNOSIS — E875 Hyperkalemia: Secondary | ICD-10-CM | POA: Diagnosis present

## 2021-09-01 DIAGNOSIS — G546 Phantom limb syndrome with pain: Secondary | ICD-10-CM | POA: Diagnosis not present

## 2021-09-01 DIAGNOSIS — D631 Anemia in chronic kidney disease: Secondary | ICD-10-CM | POA: Diagnosis present

## 2021-09-01 DIAGNOSIS — N186 End stage renal disease: Secondary | ICD-10-CM | POA: Diagnosis present

## 2021-09-01 DIAGNOSIS — E1152 Type 2 diabetes mellitus with diabetic peripheral angiopathy with gangrene: Secondary | ICD-10-CM | POA: Diagnosis present

## 2021-09-01 DIAGNOSIS — I2510 Atherosclerotic heart disease of native coronary artery without angina pectoris: Secondary | ICD-10-CM | POA: Diagnosis present

## 2021-09-01 DIAGNOSIS — I96 Gangrene, not elsewhere classified: Secondary | ICD-10-CM | POA: Diagnosis present

## 2021-09-01 DIAGNOSIS — E782 Mixed hyperlipidemia: Secondary | ICD-10-CM | POA: Diagnosis present

## 2021-09-01 DIAGNOSIS — M868X7 Other osteomyelitis, ankle and foot: Secondary | ICD-10-CM | POA: Diagnosis present

## 2021-09-01 DIAGNOSIS — E1169 Type 2 diabetes mellitus with other specified complication: Secondary | ICD-10-CM | POA: Diagnosis present

## 2021-09-01 DIAGNOSIS — E11649 Type 2 diabetes mellitus with hypoglycemia without coma: Secondary | ICD-10-CM | POA: Diagnosis not present

## 2021-09-01 DIAGNOSIS — G8918 Other acute postprocedural pain: Secondary | ICD-10-CM | POA: Diagnosis not present

## 2021-09-01 DIAGNOSIS — E039 Hypothyroidism, unspecified: Secondary | ICD-10-CM | POA: Diagnosis present

## 2021-09-05 DIAGNOSIS — E1151 Type 2 diabetes mellitus with diabetic peripheral angiopathy without gangrene: Secondary | ICD-10-CM | POA: Diagnosis not present

## 2021-09-05 DIAGNOSIS — D631 Anemia in chronic kidney disease: Secondary | ICD-10-CM | POA: Diagnosis not present

## 2021-09-05 DIAGNOSIS — Z7982 Long term (current) use of aspirin: Secondary | ICD-10-CM | POA: Diagnosis not present

## 2021-09-05 DIAGNOSIS — Z9181 History of falling: Secondary | ICD-10-CM | POA: Diagnosis not present

## 2021-09-05 DIAGNOSIS — Z89431 Acquired absence of right foot: Secondary | ICD-10-CM | POA: Diagnosis not present

## 2021-09-05 DIAGNOSIS — I120 Hypertensive chronic kidney disease with stage 5 chronic kidney disease or end stage renal disease: Secondary | ICD-10-CM | POA: Diagnosis not present

## 2021-09-05 DIAGNOSIS — E782 Mixed hyperlipidemia: Secondary | ICD-10-CM | POA: Diagnosis not present

## 2021-09-05 DIAGNOSIS — E1122 Type 2 diabetes mellitus with diabetic chronic kidney disease: Secondary | ICD-10-CM | POA: Diagnosis not present

## 2021-09-05 DIAGNOSIS — E039 Hypothyroidism, unspecified: Secondary | ICD-10-CM | POA: Diagnosis not present

## 2021-09-05 DIAGNOSIS — Z955 Presence of coronary angioplasty implant and graft: Secondary | ICD-10-CM | POA: Diagnosis not present

## 2021-09-05 DIAGNOSIS — Z89412 Acquired absence of left great toe: Secondary | ICD-10-CM | POA: Diagnosis not present

## 2021-09-05 DIAGNOSIS — N186 End stage renal disease: Secondary | ICD-10-CM | POA: Diagnosis not present

## 2021-09-05 DIAGNOSIS — Z992 Dependence on renal dialysis: Secondary | ICD-10-CM | POA: Diagnosis not present

## 2021-09-05 DIAGNOSIS — I2510 Atherosclerotic heart disease of native coronary artery without angina pectoris: Secondary | ICD-10-CM | POA: Diagnosis not present

## 2021-09-05 DIAGNOSIS — Z89422 Acquired absence of other left toe(s): Secondary | ICD-10-CM | POA: Diagnosis not present

## 2021-09-05 DIAGNOSIS — Z4781 Encounter for orthopedic aftercare following surgical amputation: Secondary | ICD-10-CM | POA: Diagnosis not present

## 2021-09-11 DIAGNOSIS — L03116 Cellulitis of left lower limb: Secondary | ICD-10-CM | POA: Diagnosis not present

## 2021-09-12 ENCOUNTER — Other Ambulatory Visit: Payer: Self-pay

## 2021-09-12 ENCOUNTER — Encounter (HOSPITAL_COMMUNITY): Payer: Self-pay

## 2021-09-12 ENCOUNTER — Inpatient Hospital Stay (HOSPITAL_COMMUNITY)
Admission: EM | Admit: 2021-09-12 | Discharge: 2021-09-15 | DRG: 280 | Disposition: A | Discharge status: Home Health | Payer: Medicare Other | Attending: Family Medicine | Admitting: Family Medicine

## 2021-09-12 ENCOUNTER — Emergency Department (HOSPITAL_COMMUNITY): Payer: Medicare Other

## 2021-09-12 DIAGNOSIS — I25110 Atherosclerotic heart disease of native coronary artery with unstable angina pectoris: Secondary | ICD-10-CM | POA: Diagnosis present

## 2021-09-12 DIAGNOSIS — Z89412 Acquired absence of left great toe: Secondary | ICD-10-CM

## 2021-09-12 DIAGNOSIS — Z833 Family history of diabetes mellitus: Secondary | ICD-10-CM

## 2021-09-12 DIAGNOSIS — I214 Non-ST elevation (NSTEMI) myocardial infarction: Secondary | ICD-10-CM | POA: Diagnosis present

## 2021-09-12 DIAGNOSIS — Z28310 Unvaccinated for covid-19: Secondary | ICD-10-CM

## 2021-09-12 DIAGNOSIS — Z880 Allergy status to penicillin: Secondary | ICD-10-CM | POA: Diagnosis not present

## 2021-09-12 DIAGNOSIS — E875 Hyperkalemia: Secondary | ICD-10-CM | POA: Diagnosis present

## 2021-09-12 DIAGNOSIS — R079 Chest pain, unspecified: Secondary | ICD-10-CM | POA: Diagnosis not present

## 2021-09-12 DIAGNOSIS — Z20822 Contact with and (suspected) exposure to covid-19: Secondary | ICD-10-CM | POA: Diagnosis present

## 2021-09-12 DIAGNOSIS — Z885 Allergy status to narcotic agent status: Secondary | ICD-10-CM

## 2021-09-12 DIAGNOSIS — E1152 Type 2 diabetes mellitus with diabetic peripheral angiopathy with gangrene: Secondary | ICD-10-CM | POA: Diagnosis present

## 2021-09-12 DIAGNOSIS — E119 Type 2 diabetes mellitus without complications: Secondary | ICD-10-CM

## 2021-09-12 DIAGNOSIS — R9431 Abnormal electrocardiogram [ECG] [EKG]: Secondary | ICD-10-CM | POA: Diagnosis present

## 2021-09-12 DIAGNOSIS — D631 Anemia in chronic kidney disease: Secondary | ICD-10-CM | POA: Diagnosis present

## 2021-09-12 DIAGNOSIS — Z79899 Other long term (current) drug therapy: Secondary | ICD-10-CM

## 2021-09-12 DIAGNOSIS — I10 Essential (primary) hypertension: Secondary | ICD-10-CM | POA: Diagnosis present

## 2021-09-12 DIAGNOSIS — L299 Pruritus, unspecified: Secondary | ICD-10-CM | POA: Diagnosis not present

## 2021-09-12 DIAGNOSIS — I120 Hypertensive chronic kidney disease with stage 5 chronic kidney disease or end stage renal disease: Secondary | ICD-10-CM | POA: Diagnosis present

## 2021-09-12 DIAGNOSIS — N2581 Secondary hyperparathyroidism of renal origin: Secondary | ICD-10-CM | POA: Diagnosis present

## 2021-09-12 DIAGNOSIS — R0789 Other chest pain: Secondary | ICD-10-CM | POA: Diagnosis not present

## 2021-09-12 DIAGNOSIS — Z992 Dependence on renal dialysis: Secondary | ICD-10-CM

## 2021-09-12 DIAGNOSIS — E785 Hyperlipidemia, unspecified: Secondary | ICD-10-CM | POA: Diagnosis present

## 2021-09-12 DIAGNOSIS — I739 Peripheral vascular disease, unspecified: Secondary | ICD-10-CM | POA: Diagnosis present

## 2021-09-12 DIAGNOSIS — I2510 Atherosclerotic heart disease of native coronary artery without angina pectoris: Secondary | ICD-10-CM | POA: Diagnosis not present

## 2021-09-12 DIAGNOSIS — Z955 Presence of coronary angioplasty implant and graft: Secondary | ICD-10-CM | POA: Diagnosis not present

## 2021-09-12 DIAGNOSIS — E1129 Type 2 diabetes mellitus with other diabetic kidney complication: Secondary | ICD-10-CM | POA: Diagnosis not present

## 2021-09-12 DIAGNOSIS — N186 End stage renal disease: Secondary | ICD-10-CM | POA: Diagnosis present

## 2021-09-12 DIAGNOSIS — Z7982 Long term (current) use of aspirin: Secondary | ICD-10-CM

## 2021-09-12 DIAGNOSIS — Z89422 Acquired absence of other left toe(s): Secondary | ICD-10-CM

## 2021-09-12 DIAGNOSIS — E1151 Type 2 diabetes mellitus with diabetic peripheral angiopathy without gangrene: Secondary | ICD-10-CM | POA: Diagnosis present

## 2021-09-12 DIAGNOSIS — N189 Chronic kidney disease, unspecified: Secondary | ICD-10-CM

## 2021-09-12 DIAGNOSIS — L89890 Pressure ulcer of other site, unstageable: Secondary | ICD-10-CM | POA: Diagnosis present

## 2021-09-12 DIAGNOSIS — I517 Cardiomegaly: Secondary | ICD-10-CM | POA: Diagnosis not present

## 2021-09-12 DIAGNOSIS — E1122 Type 2 diabetes mellitus with diabetic chronic kidney disease: Secondary | ICD-10-CM | POA: Diagnosis present

## 2021-09-12 DIAGNOSIS — R739 Hyperglycemia, unspecified: Secondary | ICD-10-CM | POA: Diagnosis not present

## 2021-09-12 DIAGNOSIS — D649 Anemia, unspecified: Secondary | ICD-10-CM | POA: Diagnosis not present

## 2021-09-12 DIAGNOSIS — I249 Acute ischemic heart disease, unspecified: Secondary | ICD-10-CM | POA: Diagnosis not present

## 2021-09-12 DIAGNOSIS — Z888 Allergy status to other drugs, medicaments and biological substances status: Secondary | ICD-10-CM | POA: Diagnosis not present

## 2021-09-12 DIAGNOSIS — Z7902 Long term (current) use of antithrombotics/antiplatelets: Secondary | ICD-10-CM

## 2021-09-12 DIAGNOSIS — I200 Unstable angina: Secondary | ICD-10-CM | POA: Diagnosis not present

## 2021-09-12 DIAGNOSIS — I1311 Hypertensive heart and chronic kidney disease without heart failure, with stage 5 chronic kidney disease, or end stage renal disease: Secondary | ICD-10-CM | POA: Diagnosis not present

## 2021-09-12 DIAGNOSIS — N250 Renal osteodystrophy: Secondary | ICD-10-CM | POA: Diagnosis not present

## 2021-09-12 DIAGNOSIS — M898X9 Other specified disorders of bone, unspecified site: Secondary | ICD-10-CM | POA: Diagnosis present

## 2021-09-12 LAB — CBC
HCT: 34.2 % — ABNORMAL LOW (ref 39.0–52.0)
Hemoglobin: 10.8 g/dL — ABNORMAL LOW (ref 13.0–17.0)
MCH: 28.3 pg (ref 26.0–34.0)
MCHC: 31.6 g/dL (ref 30.0–36.0)
MCV: 89.5 fL (ref 80.0–100.0)
Platelets: 356 10*3/uL (ref 150–400)
RBC: 3.82 MIL/uL — ABNORMAL LOW (ref 4.22–5.81)
RDW: 17.8 % — ABNORMAL HIGH (ref 11.5–15.5)
WBC: 7.5 10*3/uL (ref 4.0–10.5)
nRBC: 0 % (ref 0.0–0.2)

## 2021-09-12 LAB — BASIC METABOLIC PANEL
Anion gap: 15 (ref 5–15)
BUN: 33 mg/dL — ABNORMAL HIGH (ref 6–20)
CO2: 29 mmol/L (ref 22–32)
Calcium: 9.9 mg/dL (ref 8.9–10.3)
Chloride: 92 mmol/L — ABNORMAL LOW (ref 98–111)
Creatinine, Ser: 6.75 mg/dL — ABNORMAL HIGH (ref 0.61–1.24)
GFR, Estimated: 9 mL/min — ABNORMAL LOW (ref >60)
Glucose, Bld: 276 mg/dL — ABNORMAL HIGH (ref 70–99)
Potassium: 4.5 mmol/L (ref 3.5–5.1)
Sodium: 136 mmol/L (ref 135–145)

## 2021-09-12 LAB — LIPID PANEL
Cholesterol: 144 mg/dL (ref 0–200)
HDL: 24 mg/dL — ABNORMAL LOW (ref >40)
LDL Cholesterol: 50 mg/dL (ref 0–99)
Total CHOL/HDL Ratio: 6 RATIO
Triglycerides: 352 mg/dL — ABNORMAL HIGH (ref <150)
VLDL: 70 mg/dL — ABNORMAL HIGH (ref 0–40)

## 2021-09-12 LAB — HEPARIN LEVEL (UNFRACTIONATED)
Heparin Unfractionated: 0.22 IU/mL — ABNORMAL LOW (ref 0.30–0.70)
Heparin Unfractionated: 0.17 IU/mL — ABNORMAL LOW (ref 0.30–0.70)

## 2021-09-12 LAB — HEMOGLOBIN A1C
Hgb A1c MFr Bld: 6.6 % — ABNORMAL HIGH (ref 4.8–5.6)
Mean Plasma Glucose: 142.72 mg/dL

## 2021-09-12 LAB — TROPONIN I (HIGH SENSITIVITY)
Troponin I (High Sensitivity): 29 ng/L — ABNORMAL HIGH (ref <18)
Troponin I (High Sensitivity): 46 ng/L — ABNORMAL HIGH (ref <18)
Troponin I (High Sensitivity): 4550 ng/L (ref <18)
Troponin I (High Sensitivity): 5495 ng/L (ref <18)

## 2021-09-12 LAB — GLUCOSE, CAPILLARY
Glucose-Capillary: 72 mg/dL (ref 70–99)
Glucose-Capillary: 148 mg/dL — ABNORMAL HIGH (ref 70–99)

## 2021-09-12 LAB — CBG MONITORING, ED: Glucose-Capillary: 237 mg/dL — ABNORMAL HIGH (ref 70–99)

## 2021-09-12 LAB — HIV ANTIBODY (ROUTINE TESTING W REFLEX): HIV Screen 4th Generation wRfx: NONREACTIVE

## 2021-09-12 MED ORDER — METOPROLOL SUCCINATE ER 100 MG PO TB24
100.0000 mg | ORAL_TABLET | Freq: Every day | ORAL | Status: DC
  Administered 2021-09-12 – 2021-09-14 (×3): 100 mg via ORAL
  Filled 2021-09-12 (×3): qty 1

## 2021-09-12 MED ORDER — SODIUM ZIRCONIUM CYCLOSILICATE 10 G PO PACK: 10.0000 g | PACK | ORAL | Status: DC

## 2021-09-12 MED ORDER — SEVELAMER CARBONATE 800 MG PO TABS
1600.0000 mg | ORAL_TABLET | Freq: Three times a day (TID) | ORAL | Status: DC
  Administered 2021-09-12: 1600 mg via ORAL
  Filled 2021-09-12 (×3): qty 2

## 2021-09-12 MED ORDER — ZOLPIDEM TARTRATE 5 MG PO TABS: 5.0000 mg | ORAL_TABLET | Freq: Every evening | ORAL | Status: DC | PRN

## 2021-09-12 MED ORDER — HYDROXYZINE HCL 25 MG PO TABS
25.0000 mg | ORAL_TABLET | Freq: Three times a day (TID) | ORAL | Status: DC | PRN
  Administered 2021-09-12 – 2021-09-14 (×3): 25 mg via ORAL
  Filled 2021-09-12 (×3): qty 1

## 2021-09-12 MED ORDER — LORAZEPAM 2 MG/ML IJ SOLN
0.5000 mg | Freq: Once | INTRAMUSCULAR | Status: AC
  Administered 2021-09-12: 0.5 mg via INTRAVENOUS
  Filled 2021-09-12: qty 1

## 2021-09-12 MED ORDER — FENTANYL CITRATE PF 50 MCG/ML IJ SOSY: 25.0000 ug | PREFILLED_SYRINGE | INTRAMUSCULAR | Status: DC | PRN

## 2021-09-12 MED ORDER — METRONIDAZOLE 500 MG PO TABS
500.0000 mg | ORAL_TABLET | Freq: Two times a day (BID) | ORAL | Status: DC
  Administered 2021-09-12 – 2021-09-15 (×6): 500 mg via ORAL
  Filled 2021-09-12 (×5): qty 1

## 2021-09-12 MED ORDER — NITROGLYCERIN IN D5W 200-5 MCG/ML-% IV SOLN
0.0000 ug/min | INTRAVENOUS | Status: DC
  Administered 2021-09-12: 10 ug/min via INTRAVENOUS
  Administered 2021-09-14: 6 ug/min via INTRAVENOUS
  Filled 2021-09-12 (×2): qty 250

## 2021-09-12 MED ORDER — OXYCODONE HCL 5 MG PO TABS
5.0000 mg | ORAL_TABLET | Freq: Four times a day (QID) | ORAL | Status: DC | PRN
  Administered 2021-09-14: 5 mg via ORAL
  Filled 2021-09-12: qty 1

## 2021-09-12 MED ORDER — CAMPHOR-MENTHOL 0.5-0.5 % EX LOTN
1.0000 "application " | TOPICAL_LOTION | Freq: Three times a day (TID) | CUTANEOUS | Status: DC | PRN
  Filled 2021-09-12: qty 222

## 2021-09-12 MED ORDER — SODIUM CHLORIDE 0.9% FLUSH
3.0000 mL | Freq: Two times a day (BID) | INTRAVENOUS | Status: DC
  Administered 2021-09-13 – 2021-09-15 (×4): 3 mL via INTRAVENOUS

## 2021-09-12 MED ORDER — SODIUM CHLORIDE 0.9% FLUSH: 3.0000 mL | INTRAVENOUS | Status: DC | PRN

## 2021-09-12 MED ORDER — SORBITOL 70 % SOLN
30.0000 mL | Status: DC | PRN
  Filled 2021-09-12: qty 30

## 2021-09-12 MED ORDER — TRAZODONE HCL 50 MG PO TABS
150.0000 mg | ORAL_TABLET | Freq: Every day | ORAL | Status: DC
  Administered 2021-09-12 – 2021-09-14 (×3): 150 mg via ORAL
  Filled 2021-09-12 (×3): qty 1

## 2021-09-12 MED ORDER — DOCUSATE SODIUM 283 MG RE ENEM
1.0000 | ENEMA | RECTAL | Status: DC | PRN
  Filled 2021-09-12: qty 1

## 2021-09-12 MED ORDER — DIPHENHYDRAMINE HCL 50 MG/ML IJ SOLN
25.0000 mg | Freq: Once | INTRAMUSCULAR | Status: AC
  Administered 2021-09-12: 25 mg via INTRAVENOUS
  Filled 2021-09-12: qty 1

## 2021-09-12 MED ORDER — HEPARIN BOLUS VIA INFUSION
2000.0000 [IU] | Freq: Once | INTRAVENOUS | Status: AC
  Administered 2021-09-13: 2000 [IU] via INTRAVENOUS
  Filled 2021-09-12: qty 2000

## 2021-09-12 MED ORDER — TICAGRELOR 90 MG PO TABS
90.0000 mg | ORAL_TABLET | Freq: Two times a day (BID) | ORAL | Status: DC
  Administered 2021-09-12 – 2021-09-15 (×7): 90 mg via ORAL
  Filled 2021-09-12 (×7): qty 1

## 2021-09-12 MED ORDER — INSULIN ASPART 100 UNIT/ML IJ SOLN
0.0000 [IU] | Freq: Three times a day (TID) | INTRAMUSCULAR | Status: DC
  Administered 2021-09-12: 2 [IU] via SUBCUTANEOUS

## 2021-09-12 MED ORDER — HYDRALAZINE HCL 50 MG PO TABS
50.0000 mg | ORAL_TABLET | Freq: Three times a day (TID) | ORAL | Status: DC
  Administered 2021-09-12 – 2021-09-15 (×8): 50 mg via ORAL
  Filled 2021-09-12: qty 1
  Filled 2021-09-12: qty 2
  Filled 2021-09-12 (×7): qty 1

## 2021-09-12 MED ORDER — HYDROMORPHONE HCL 1 MG/ML IJ SOLN
1.0000 mg | Freq: Once | INTRAMUSCULAR | Status: AC
  Administered 2021-09-12: 1 mg via INTRAVENOUS
  Filled 2021-09-12: qty 1

## 2021-09-12 MED ORDER — ONDANSETRON HCL 4 MG/2ML IJ SOLN: 4.0000 mg | Freq: Four times a day (QID) | INTRAMUSCULAR | Status: DC | PRN

## 2021-09-12 MED ORDER — CALCIUM CARBONATE ANTACID 1250 MG/5ML PO SUSP: 500.0000 mg | Freq: Four times a day (QID) | ORAL | Status: DC | PRN

## 2021-09-12 MED ORDER — SODIUM CHLORIDE 0.9 % IV SOLN: 250.0000 mL | INTRAVENOUS | Status: DC | PRN

## 2021-09-12 MED ORDER — NEPRO/CARBSTEADY PO LIQD
237.0000 mL | Freq: Three times a day (TID) | ORAL | Status: DC | PRN
  Filled 2021-09-12: qty 237

## 2021-09-12 MED ORDER — MORPHINE SULFATE (PF) 4 MG/ML IV SOLN
4.0000 mg | Freq: Once | INTRAVENOUS | Status: AC
  Administered 2021-09-12: 4 mg via INTRAVENOUS
  Filled 2021-09-12: qty 1

## 2021-09-12 MED ORDER — AMLODIPINE BESYLATE 10 MG PO TABS
10.0000 mg | ORAL_TABLET | Freq: Every day | ORAL | Status: DC
  Administered 2021-09-12 – 2021-09-13 (×2): 10 mg via ORAL
  Filled 2021-09-12 (×2): qty 1

## 2021-09-12 MED ORDER — ACETAMINOPHEN 325 MG PO TABS
650.0000 mg | ORAL_TABLET | ORAL | Status: DC | PRN
  Administered 2021-09-13: 650 mg via ORAL
  Filled 2021-09-12: qty 2

## 2021-09-12 MED ORDER — HEPARIN BOLUS VIA INFUSION
3000.0000 [IU] | Freq: Once | INTRAVENOUS | Status: AC
  Administered 2021-09-12: 3000 [IU] via INTRAVENOUS
  Filled 2021-09-12: qty 3000

## 2021-09-12 MED ORDER — ASPIRIN EC 81 MG PO TBEC
81.0000 mg | DELAYED_RELEASE_TABLET | Freq: Every day | ORAL | Status: DC
  Administered 2021-09-12 – 2021-09-15 (×4): 81 mg via ORAL
  Filled 2021-09-12 (×4): qty 1

## 2021-09-12 MED ORDER — HEPARIN (PORCINE) 25000 UT/250ML-% IV SOLN
1500.0000 [IU]/h | INTRAVENOUS | Status: DC
  Administered 2021-09-12: 1100 [IU]/h via INTRAVENOUS
  Administered 2021-09-13: 1500 [IU]/h via INTRAVENOUS
  Filled 2021-09-12 (×3): qty 250

## 2021-09-12 NOTE — Plan of Care (Signed)

## 2021-09-12 NOTE — ED Triage Notes (Signed)
Pt comes via [facility] for CP that started last night, PTA received 324 ASA, 3 nitro and 4mg  zofran that helped with the pain, pt is also c/o of itching in his feet.

## 2021-09-12 NOTE — H&P (Addendum)
History and Physical    [name] YTK:[id number] DOB: 07/21/1966 DOA: 09/12/2021  PCP: [name], MD Consultants:  Loanne Drilling - endocrinology; nephrology; Earleen Newport - vascular surgery; Tobe Sos - cardiology; [place] - retina Patient coming from:  Home - lives with wife; NOK: Wife, [name] [phone]  Chief Complaint: Chest pain  HPI: [name] is a 55 y.o. male with medical history significant of PVD s/p toe amputations;  CAD; DM; ESRD on HD; HTN; and HLD presenting with chest pain. He ate some fish steaks and fries yesterday evening.  He was prepping for today when his chest started hurting about 730pm.  He took NTG and it resolved.  He also vomited. It came back and he took another with resolution.  It started again about 10pm and nagged all night.  He started aching and finally decided to come in.  He had a recent cath and had blockages, but they couldn't figure out why he was having those specific symptoms and so he was managed medically.  He lost 2 toes from prior cath that caused embolization of plaque.  He is still having substernal pain.  Somewhat better than when he got here but still present.  Radiates into his back.  He is still nauseated.  Pain is 7/10.  Pain meds helped, pain was previously "a 12."  This is consistent with prior index symptoms.  He did feel better after vomiting initially.  His wife thinks he is allergic to fish since he also had issues prior when he ate fish.  He was last admitted at [facility] with CP from 11/2-5.  In 09/2020 he had PCI and was noted to have in-stent restenosis of the PCI to the LAD in 03/2021.  He underwent LHC on 08/23/21 with multivessel disease and no targets for PCI noted.  There were no medication changes made following this hospitalization.  He returned from 11/12-16 with toe gangrene that was thought to be related to embolic event from [employer].  He underwent amputation of the L 1/2 toes and had a wound vac placed.    ED Course: Recently at [facility] for  CP, cath with multivessel disease, stent with 80% in-stent stenosis, decided on medical management a month ago.  Back with ongoing CP.  Troponin minimally elevated.  Cardiology consulted and will see, recommend admission to [facility].  Review of Systems: As per HPI; otherwise review of systems reviewed and negative.   Ambulatory Status:  Ambulates without assistance  COVID Vaccine Status:  None  Past Medical History:  Diagnosis Date   Acute osteomyelitis of right foot ([place]) 05/15/2019   Anemia 75/17/0017   Complication of anesthesia    Coronary artery disease    Diabetes with renal manifestations(250.4) 08/23/2013   Diabetic infection of right foot ([place])    Diabetic retinopathy associated with type 2 diabetes mellitus ([place]) 06/23/2019   ESRD on hemodialysis ([place]) 07/18/2011   Essential hypertension 10/18/2007   Qualifier: Diagnosis of  By: Loanne Drilling MD, Hilliard Clark A    History of blood transfusion    HYPERLIPIDEMIA 05/16/2008   Qualifier: Diagnosis of  By: Jenny Reichmann MD, Hunt Oris    MRSA INFECTION 10/18/2007   Qualifier: Diagnosis of  By: Marca Ancona RMA, Lucy     Other forms of retinal detachment(361.89) 06/23/2019   PONV (postoperative nausea and vomiting)    Secondary hyperparathyroidism ([place]) 06/23/2019   Sepsis ([place]) 05/14/2019    Past Surgical History:  Procedure Laterality Date   ABDOMINAL AORTOGRAM N/A 05/17/2019   Procedure: ABDOMINAL AORTOGRAM;  Surgeon: Elam Dutch, MD;  Location: [place] CV LAB;  Service: Cardiovascular;  Laterality: N/A;   AMPUTATION TOE Right 05/14/2019   Procedure: PARTIAL AMPUTATION SECOND TOE RIGHT FOOT;  Surgeon: Evelina Bucy, DPM;  Location: [place];  Service: Podiatry;  Laterality: Right;   AMPUTATION TOE Right 05/23/2019   Procedure: AMPUTATION OF SECOND TOE METATARSAL PHALANGEAL JOINT;  Surgeon: Evelina Bucy, DPM;  Location: [place];  Service: Podiatry;  Laterality: Right;   AMPUTATION TOE Right 05/27/2019   Procedure: Amputation Toe;  Surgeon: Evelina Bucy, DPM;  Location: [place];  Service: Podiatry;  Laterality: Right;  right third toe   APPLICATION OF WOUND VAC Right 05/27/2019   Procedure: Application Of Wound Vac;  Surgeon: Evelina Bucy, DPM;  Location: [place];  Service: Podiatry;  Laterality: Right;   AV FISTULA PLACEMENT  06-18-11   Right brachiocephalic AVF   BONE BIOPSY Right 05/14/2019   Procedure: OPEN SUPERFICIAL BONE BIOPSY GREAT TOE;  Surgeon: Evelina Bucy, DPM;  Location: [place];  Service: Podiatry;  Laterality: Right;   CARDIAC CATHETERIZATION     CORONARY BALLOON ANGIOPLASTY N/A 09/24/2020   Procedure: CORONARY BALLOON ANGIOPLASTY;  Surgeon: Martinique, Peter M, MD;  Location: [place] CV LAB;  Service: Cardiovascular;  Laterality: N/A;   CORONARY STENT INTERVENTION N/A 09/24/2020   Procedure: CORONARY STENT INTERVENTION;  Surgeon: Martinique, Peter M, MD;  Location: [place] CV LAB;  Service: Cardiovascular;  Laterality: N/A;   EYE SURGERY     left eye for Laser, diabetic retinopathy   FISTULOGRAM Right 11/06/2011   Procedure: FISTULOGRAM;  Surgeon: Conrad Kinston, MD;  Location: [facility] CATH LAB;  Service: Cardiovascular;  Laterality: Right;   HEMATOMA EVACUATION Right Feb. 25, 2014   HEMATOMA EVACUATION Right 12/14/2012   Procedure: EVACUATION HEMATOMA;  Surgeon: Conrad Burnet, MD;  Location: [place];  Service: Vascular;  Laterality: Right;   INCISION AND DRAINAGE Right 05/23/2019   Procedure: INCISION AND DRAINAGE OF RIGHT FOOT DEEP SPACE ABSCESS;  Surgeon: Evelina Bucy, DPM;  Location: [place];  Service: Podiatry;  Laterality: Right;   INSERTION OF DIALYSIS CATHETER  12/14/2012   Procedure: INSERTION OF DIALYSIS CATHETER;  Surgeon: Conrad Willernie, MD;  Location: [place];  Service: Vascular;;   INSERTION OF DIALYSIS CATHETER Right 01/07/2021   Procedure: INSERTION OF DIALYSIS CATHETER;  Surgeon: Elam Dutch, MD;  Location: [place];  Service: Vascular;  Laterality: Right;   INSERTION OF DIALYSIS CATHETER Left 01/08/2021    Procedure: INSERTION OF LEFT INTERNAL JUGULAR DIALYSIS CATHETER, tunneled;  Surgeon: Angelia Mould, MD;  Location: [place];  Service: Vascular;  Laterality: Left;   INTRAVASCULAR PRESSURE WIRE/FFR STUDY N/A 09/24/2020   Procedure: INTRAVASCULAR PRESSURE WIRE/FFR STUDY;  Surgeon: Martinique, Peter M, MD;  Location: [place] CV LAB;  Service: Cardiovascular;  Laterality: N/A;   IRRIGATION AND DEBRIDEMENT FOOT Right 05/25/2019   Procedure: INCISION AND DRAINAGE FOOT;  Surgeon: Evelina Bucy, DPM;  Location: [place];  Service: Podiatry;  Laterality: Right;   IRRIGATION AND DEBRIDEMENT FOOT Right 05/27/2019   Procedure: IRRIGATION AND DEBRIDEMENT FOOT;  Surgeon: Evelina Bucy, DPM;  Location: [place];  Service: Podiatry;  Laterality: Right;   LEFT HEART CATH AND CORONARY ANGIOGRAPHY N/A 09/24/2020   Procedure: LEFT HEART CATH AND CORONARY ANGIOGRAPHY;  Surgeon: Martinique, Peter M, MD;  Location: [place] CV LAB;  Service: Cardiovascular;  Laterality: N/A;   LOWER EXTREMITY ANGIOGRAPHY Bilateral 05/17/2019  Procedure: LOWER EXTREMITY ANGIOGRAPHY;  Surgeon: Elam Dutch, MD;  Location: [place] CV LAB;  Service: Cardiovascular;  Laterality: Bilateral;   REMOVAL OF A DIALYSIS CATHETER Right 01/08/2021   Procedure: REMOVAL OF TEMPORARY DIALYSIS CATHETER, LEFT FEMORAL VEIN;  Surgeon: Angelia Mould, MD;  Location[facility];  Service: Vascular;  Laterality: Right;   REVISON OF ARTERIOVENOUS FISTULA Right 12/14/2012   Procedure: REVISON OF upper arm ARTERIOVENOUS FISTULA using 82mx10cm gortex graft;  Surgeon: BConrad [place] MD;  [place]  Service: Vascular;  Laterality: Right;   REVISON OF ARTERIOVENOUS FISTULA Right 12/27/2020   Procedure: RIGHT UPPER EXTREMITY ARTERIOVENOUS FISTULA REVISiON;  Surgeon: HCherre Robins MD;  [facility];  Service: Vascular;  Laterality: Right;  PERIPHERAL NERVE BLOCK   RIGHT/LEFT HEART CATH AND CORONARY ANGIOGRAPHY N/A 08/07/2020   Procedure: RIGHT/LEFT  HEART CATH AND CORONARY ANGIOGRAPHY;  Surgeon: JMartinique Peter M, MD;  [place];  Service: Cardiovascular;  Laterality: N/A;   SOFT TISSUE MASS EXCISION     Right arm, Left leg  for MRSA infection    Social History   Socioeconomic History   Marital status: Married    Spouse name: Not on file   Number of children: 2   Years of education: 14   Highest education level: Not on file  Occupational History   Occupation: Disabled  Tobacco Use   Smoking status: Never   Smokeless tobacco: Never  Vaping Use   Vaping Use: Never used  Substance and Sexual Activity   Alcohol use: No   Drug use: No   Sexual activity: Yes  Other Topics Concern   Not on file  Social History Narrative   Not on file   Social Determinants of Health   Financial Resource Strain: Not on file  Food Insecurity: Not on file  Transportation Needs: Not on file  Physical Activity: Not on file  Stress: Not on file  Social Connections: Not on file  Intimate Partner Violence: Not on file    Allergies  Allergen Reactions   Atorvastatin     Other reaction(s): Muscle Pain   Rosuvastatin     Other reaction(s): Muscle Pain   Amoxicillin Itching    Did it involve swelling of the face/tongue/throat, SOB, or low BP? Unknown Did it involve sudden or severe rash/hives, skin peeling, or any reaction on the inside of your mouth or nose? Unknown Did you need to seek medical attention at a hospital or doctor's office? Unknown When did it last happen?      20 years ago If all above answers are "NO", may proceed with cephalosporin use.    Chlorhexidine Hives    Patient has blistering   Hydromorphone Itching    Patient states he may take with benadryl. Makes feet itch.   Percocet [Oxycodone-Acetaminophen] Itching and Other (See Comments)    Legs only. Itching and burning feeling.   Morphine     Other reaction(s): rash/itching All narcotics per pt   Gabapentin Nausea And Vomiting    Stomach issues      Family History  Problem Relation Age of Onset   Diabetes Mother     Prior to Admission medications   Medication Sig Start Date End Date Taking? Authorizing Provider  amLODipine (NORVASC) 10 MG tablet Take 10 mg by mouth at bedtime.  08/03/20  Yes [provider]  aspirin EC 81 MG tablet Take 1 tablet (81 mg total) by mouth daily. 06/28/19  Yes Arida,  Mertie Clause, MD  Continuous Blood Gluc Sensor (FREESTYLE LIBRE 14 DAY SENSOR) MISC See admin instructions. 06/20/19  Yes [provider]  Doxercalciferol (Kaltag IV) Doxercalciferol (Hectorol) 07/31/21 07/30/22 Yes [provider]  ethyl chloride spray 1 application See admin instructions. As needed for pain on dialysis days 03/15/21  Yes [provider]  Evolocumab (REPATHA SURECLICK) 160 MG/ML SOAJ Inject 140 mg into the skin every 14 (fourteen) days. 03/26/21  Yes [provider]  hydrALAZINE (APRESOLINE) 50 MG tablet Take 50 mg by mouth 3 (three) times daily. 12/18/20  Yes [provider]  isosorbide dinitrate (ISORDIL) 20 MG tablet Take 20 mg by mouth 3 (three) times daily. 08/24/20  Yes [provider]  Methoxy PEG-Epoetin Beta (MIRCERA IJ) dialysis 05/06/21 05/05/22 Yes [provider]  metoprolol succinate (TOPROL-XL) 100 MG 24 hr tablet Take 100 mg by mouth at bedtime. 08/06/21  Yes [provider]  nitroGLYCERIN (NITROSTAT) 0.4 MG SL tablet Place 0.4 mg under the tongue every 5 (five) minutes as needed for chest pain. 08/24/21 08/24/22 Yes [provider]  sevelamer carbonate (RENVELA) 800 MG tablet Take 1,600 mg by mouth 3 (three) times daily with meals. 07/25/20  Yes [provider]  sodium zirconium cyclosilicate (LOKELMA) 10 g PACK packet Take 10 g by mouth See admin instructions. Non dialysis days(Tuesday,Thursday,Saturday and Sunday)   Yes [provider]  ticagrelor (BRILINTA) 90 MG TABS tablet TAKE 1 TABLET (90 MG TOTAL) BY MOUTH TWO  TIMES DAILY. Patient taking differently: Take 90 mg by mouth 2 (two) times daily. 09/26/20 09/26/21 Yes Hongalgi, Lenis Dickinson, MD  traZODone (DESYREL) 150 MG tablet Take 150 mg by mouth at bedtime. 07/15/21  Yes [provider]  blood glucose meter kit and supplies Dispense based on patient and insurance preference. Use up to four times daily as directed. (FOR ICD-10 E10.9, E11.9). Patient not taking: Reported on 09/12/2021 05/18/19   Cristal Ford, DO  blood glucose meter kit and supplies Dispense based on patient and insurance preference. Use up to four times daily as directed. (FOR ICD-9 250.00, 250.01). Patient not taking: Reported on 09/12/2021 05/18/19   Cristal Ford, DO  glucose blood (ACCU-CHEK AVIVA PLUS) test strip Use as instructed Patient not taking: Reported on 09/12/2021 05/18/19   Cristal Ford, DO  Lancet Devices [facility]) lancets Use as instructed Patient not taking: Reported on 09/12/2021 05/18/19   Cristal Ford, DO  lidocaine-prilocaine (EMLA) cream Apply 1 application topically as needed. Patient not taking: Reported on 09/12/2021 01/02/21   Cherre Robins, MD  oxyCODONE (OXY IR/ROXICODONE) 5 MG immediate release tablet Take 5 mg by mouth 4 (four) times daily as needed. 09/10/21   [provider]  propranolol (INDERAL) 80 MG tablet Take 1 tablet (80 mg total) by mouth 2 (two) times daily. Patient not taking: Reported on 06/21/2021 09/26/20   Modena Jansky, MD  loratadine (CLARITIN) 10 MG tablet Take 10 mg by mouth daily as needed for allergies.  11/06/19  [provider]    Physical Exam: Vitals:   09/12/21 0715 09/12/21 1021 09/12/21 1030 09/12/21 1056  BP: (!) 184/96 (!) 151/93 (!) 141/84   Pulse: 92 83 80   Resp:  20 18   Temp:    97.7 F (36.5 C)  TempSrc:    Oral  SpO2: 100% 98% 94%   Weight:      Height:         General:  Appears calm and comfortable and is in  NAD Eyes:   EOMI, normal lids, iris ENT:  grossly  normal hearing, lips & tongue, mmm Neck:  no LAD, masses or thyromegaly Cardiovascular:  RRR, no m/r/g. No LE edema.  Respiratory:   CTA bilaterally with no wheezes/rales/rhonchi.  Normal respiratory effort. Abdomen:  soft, NT, ND Skin:  no rash or induration seen on limited exam Musculoskeletal:  s/p remote R TMA, appears to be healing well.  Recent L 1st and 2nd toes amputation with current wound vac in place. Psychiatric:  grossly normal mood and affect, speech fluent and appropriate, AOx3 Neurologic:  CN 2-12 grossly intact, moves all extremities in coordinated fashion    Radiological Exams on Admission: Independently reviewed - see discussion in A/P where applicable  DG Chest 2 View  Result Date: 09/12/2021 CLINICAL DATA:  Chest pain EXAM: CHEST - 2 VIEW COMPARISON:  08/20/2021 FINDINGS: Generous heart size accentuated by low lung volumes. Indistinct perihilar opacity. No pleural fluid or pneumothorax. Right subclavian stenting. IMPRESSION: 1. Indistinct perihilar opacity which could be edema or atelectasis. 2. Chronic cardiomegaly. Electronically Signed   By: Jorje Guild M.D.   On: 09/12/2021 05:36    EKG: Independently reviewed.  NSR with rate 94; prolonged QTc 505; nonspecific ST changes with no evidence of acute ischemia   Labs on Admission: I have personally reviewed the available labs and imaging studies at the time of the admission.  Pertinent labs:   Glucose 276 BUN 33/Creatinine 6.75/GFR 9 HS troponin 36, 29 WBC 7.5 Hgb 10.8 COVID/flu negative   Assessment/Plan Principal Problem:   ACS (acute coronary syndrome) (HCC) Active Problems:   Essential hypertension   ESRD on dialysis (HCC)   Type 2 diabetes mellitus (HCC)   Dyslipidemia   PAD (peripheral artery disease) (HCC)   Prolonged QT interval   ACS -Patient with known multivessel CAD with in-stent stenosis on recent cath in 08/2021 presenting with substernal chest pain that came on acutely yesterday  with waxing and waning, improved with NTG. -CXR unremarkable.   -Initial HS troponin mildly elevated; repeat with negative delta.   -EKG with no apparent STEMI. -TIMI risk score is 5; which predicts a 14 days risk of death, recurrent MI, or urgent revascularization of 26%.  -Will plan to observe in progressive care unit at [facility] on telemetry to further evaluate for ACS.  -Cardiology consultation requested -NPO for now pending cardiology input -Patient appears likely to need invasive evaluation (cardiac catheterization) based on concerning history, pertinent symptoms, known CAD -Continue ASA 81 mg daily as well as Brilinta -NTG drip ordered -Heparin drip ordered -Oxy, fentanyl given -Supplemental O2  ESRD on HD -Patient on chronic MWF HD -Nephrology prn order set utilized -He does not appear to be volume overloaded or otherwise in need of acute HD -Nephrology has been notified that patient will need HD  -Continue Renvela, Lokelma  HTN -Continue Norvasc, Hydralazine, Toprol XL -Will also add prn hydralazine  HLD -Lipids were checked in 09/2020 (132/69/48/73) -Reports prior intolerance to statins -On Repatha as an outpatient  DM -Last A1c was 7.4, will repeat -He does not appear to be taking medications for this issue -Will cover with very sensitive-scale SSI for now -May utilize continuous glucose monitoring, as he uses this at home -Will order diabetes coordinator consult  Prolonged QTc -Will attempt to avoid QT-prolonging medications such as PPI, nausea meds, SSRIs -Repeat EKG in AM   PAD -s/p remote R TMA and recent L 1/2 toe amputations -Wound vac is in place on L  foot     Note: This patient has been tested and is negative for the novel coronavirus COVID-19. The patient has NOT been vaccinated against COVID-19.   Level of care: Progressive  DVT prophylaxis: Heparin drip Code Status:  Full - confirmed with patient Family Communication: None present; he does not  request that I speak with his wife at the time of admission Disposition Plan:  The patient is from: home  Anticipated d/c is to: home without [facility] services   Anticipated d/c date will depend on clinical response to treatment, but possibly as early as tomorrow if he has excellent response to treatment  Patient is currently: acutely ill Consults called: Cardiology; Nephrology; diabetes coordinator  Admission status: It is my clinical opinion that referral for OBSERVATION is reasonable and necessary in this patient based on the above information provided. The aforementioned taken together are felt to place the patient at high risk for further clinical deterioration. However it is anticipated that the patient may be medically stable for discharge from the hospital within 24 to 48 hours.    Karmen Bongo MD [facility]   How to contact the [facility] Attending or Consulting provider [place] or covering provider during after hours [place], for this patient?  Check the care team in [facility] and look for a) attending/consulting [facility] provider listed and b) the [facility] team listed Log into [email or web address] and use [place]'s universal password to access. If you do not have the password, please contact the hospital operator. Locate the [facility] provider you are looking for under [facility] and page to a number that you can be directly reached. If you still have difficulty reaching the provider, please page the [facility] (Director on Call) for the Hospitalists listed on amion for assistance.   09/12/2021, 11:37 AM

## 2021-09-12 NOTE — ED Notes (Signed)
States he is feeling better, still has some chest pain wife at bedside.

## 2021-09-12 NOTE — Progress Notes (Signed)
Called by RN concerning Rx for Flagyl. Pt stated that he is suppose to be on Flagyl bid for 6 weeks for osteomyelitis from [facility]. Started on 09/04/21 after amputation at [facility].  Found it documented in D/C summary from [place].  Flagyl 500 mg po bid ordered

## 2021-09-12 NOTE — Progress Notes (Signed)
Lab called with critical troponin lab value of 4550. Primary RN notified. Paged [facility] to notify.

## 2021-09-12 NOTE — Progress Notes (Signed)
[place] for heparin Indication: chest pain/ACS  Allergies  Allergen Reactions   Atorvastatin     Other reaction(s): Muscle Pain   Rosuvastatin     Other reaction(s): Muscle Pain   Amoxicillin Itching    Did it involve swelling of the face/tongue/throat, SOB, or low BP? Unknown Did it involve sudden or severe rash/hives, skin peeling, or any reaction on the inside of your mouth or nose? Unknown Did you need to seek medical attention at a hospital or doctor's office? Unknown When did it last happen?      20 years ago If all above answers are "NO", may proceed with cephalosporin use.    Chlorhexidine Hives    Patient has blistering   Hydromorphone Itching    Patient states he may take with benadryl. Makes feet itch.   Percocet [Oxycodone-Acetaminophen] Itching and Other (See Comments)    Legs only. Itching and burning feeling.   Morphine     Other reaction(s): rash/itching All narcotics per pt   Gabapentin Nausea And Vomiting    Stomach issues     Patient Measurements: Height: 5\' 10"  (177.8 cm) Weight: 83.9 kg (184 lb 15.5 oz) IBW/kg (Calculated) : 73 Heparin Dosing Weight: 84 kg  Vital Signs: Temp: 97.8 F (36.6 C) (11/24 1900) Temp Source: Oral (11/24 1900) BP: 137/80 (11/24 2138) Pulse Rate: 78 (11/24 1611)  Labs: Recent Labs    09/12/21 0507 09/12/21 0749 09/12/21 1448 09/12/21 1458 09/12/21 1708 09/12/21 2252  HGB 10.8*  --   --   --   --   --   HCT 34.2*  --   --   --   --   --   PLT 356  --   --   --   --   --   HEPARINUNFRC  --   --  0.22*  --   --  0.17*  CREATININE 6.75*  --   --   --   --   --   TROPONINIHS 29* 46*  --  4,550* 5,495*  --      Estimated Creatinine Clearance: 12.8 mL/min (A) (by C-G formula based on SCr of 6.75 mg/dL (H)).  Assessment: 55yo male with chest pain and mildly elevated troponin on admission. Not on anticoagulation prior to admission. Pharmacy consulted to dose heparin.    Heparin level down to 0.17 (subtherapeutic) on gtt at 1300 units/hr. No issues with line or bleeding reported per RN.  Goal of Therapy:  Heparin level 0.3-0.7 units/ml Monitor platelets by anticoagulation protocol: Yes   Plan:  Rebolus heparin 2000 units Increase heparin infusion to 1500 units/hr F/u 8 hour heparin level  Sherlon Handing, PharmD, BCPS Please see amion for complete clinical pharmacist phone list 09/12/2021  11:43 PM

## 2021-09-12 NOTE — Progress Notes (Signed)
[place] for heparin Indication: chest pain/ACS  Allergies  Allergen Reactions   Atorvastatin     Other reaction(s): Muscle Pain   Rosuvastatin     Other reaction(s): Muscle Pain   Amoxicillin Itching    Did it involve swelling of the face/tongue/throat, SOB, or low BP? Unknown Did it involve sudden or severe rash/hives, skin peeling, or any reaction on the inside of your mouth or nose? Unknown Did you need to seek medical attention at a hospital or doctor's office? Unknown When did it last happen?      20 years ago If all above answers are "NO", may proceed with cephalosporin use.    Chlorhexidine Hives    Patient has blistering   Hydromorphone Itching    Patient states he may take with benadryl. Makes feet itch.   Percocet [Oxycodone-Acetaminophen] Itching and Other (See Comments)    Legs only. Itching and burning feeling.   Morphine     Other reaction(s): rash/itching All narcotics per pt   Gabapentin Nausea And Vomiting    Stomach issues     Patient Measurements: Height: 5\' 10"  (177.8 cm) Weight: 83.9 kg (184 lb 15.5 oz) IBW/kg (Calculated) : 73 Heparin Dosing Weight: 84 kg  Vital Signs: Temp: 97.3 F (36.3 C) (11/24 1302) Temp Source: Oral (11/24 1302) BP: 170/88 (11/24 1302) Pulse Rate: 80 (11/24 1030)  Labs: Recent Labs    09/12/21 0507 09/12/21 0749 09/12/21 1448  HGB 10.8*  --   --   HCT 34.2*  --   --   PLT 356  --   --   HEPARINUNFRC  --   --  0.22*  CREATININE 6.75*  --   --   TROPONINIHS 29* 46*  --     Estimated Creatinine Clearance: 12.8 mL/min (A) (by C-G formula based on SCr of 6.75 mg/dL (H)).  Medical History: Past Medical History:  Diagnosis Date   Acute osteomyelitis of right foot ([place]) 05/15/2019   Anemia 59/16/3846   Complication of anesthesia    Coronary artery disease    Diabetes with renal manifestations(250.4) 08/23/2013   Diabetic infection of right foot ([place])    Diabetic retinopathy  associated with type 2 diabetes mellitus ([place]) 06/23/2019   ESRD on hemodialysis ([place]) 07/18/2011   Essential hypertension 10/18/2007   Qualifier: Diagnosis of  By: Loanne Drilling MD, Hilliard Clark A    History of blood transfusion    HYPERLIPIDEMIA 05/16/2008   Qualifier: Diagnosis of  By: Jenny Reichmann MD, Hunt Oris    MRSA INFECTION 10/18/2007   Qualifier: Diagnosis of  By: Marca Ancona RMA, Lucy     Other forms of retinal detachment(361.89) 06/23/2019   PONV (postoperative nausea and vomiting)    Secondary hyperparathyroidism ([place]) 06/23/2019   Sepsis ([place]) 05/14/2019    Medications:  Infusions:   sodium chloride     heparin 1,100 Units/hr (09/12/21 0707)   nitroGLYCERIN 20 mcg/min (09/12/21 6599)    Assessment: 55yo male with chest pain and mildly elevated troponin on admission. Not on anticoagulation prior to admission. Pharmacy consulted to dose heparin.   Heparin level of 0.22 is subtherapeutic after initial 3000 unit bolus and 1100 unit/hr infusion. CBC stable. No signs of bleeding or infusion problems noted. Level drawn appropriately.  Goal of Therapy:  Heparin level 0.3-0.7 units/ml Monitor platelets by anticoagulation protocol: Yes   Plan:  Increase heparin infusion 1300 units/hr 6 hour heparin level Daily CBC, heparin level Monitor for s/sx of bleeding  Laurey Arrow, PharmD PGY1  Pharmacy Resident 09/12/2021  3:40 PM  Please check [email or web address] for unit-specific pharmacy phone numbers.

## 2021-09-12 NOTE — Progress Notes (Signed)
Inpatient Diabetes Program Recommendations  AACE/ADA: New Consensus Statement on Inpatient Glycemic Control (2015)  Target Ranges:  Prepandial:   less than 140 mg/dL      Peak postprandial:   less than 180 mg/dL (1-2 hours)      Critically ill patients:  140 - 180 mg/dL   Lab Results  Component Value Date   GLUCAP 237 (H) 09/12/2021   HGBA1C 6.6 (H) 09/12/2021    Review of Glycemic Control  Latest Reference Range & Units 09/12/21 12:17  Glucose-Capillary 70 - 99 mg/dL 237 (H)  Novolog 2 units given   Diabetes history: DM 2 Outpatient Diabetes medications: None Current orders for Inpatient glycemic control:  Novolog 0-6 units tid  Consult recommendations for Diabetes management  Inpatient Diabetes Program Recommendations:    - agree with current orders, monitor trends today with elevated renal function. Pt without medications outpatient  A1c 6.6% with Hgb of 10.8  Thanks,  [name] Headings RN, MSN, BC-ADM Inpatient Diabetes Coordinator Team Pager [phone] (8a-5p)

## 2021-09-12 NOTE — ED Provider Notes (Addendum)
[facility] EMERGENCY DEPARTMENT Provider Note   CSN: [id number] Arrival date & time: 09/12/21  0447     History Chief Complaint  Patient presents with   Chest Pain    [name] is a 55 y.o. male.  The history is provided by the patient.  Chest Pain He has history of hypertension, diabetes, hyperlipidemia, end-stage renal disease on hemodialysis, coronary artery disease, peripheral vascular disease and comes in with anterior chest pain which started last night.  Pain has been constant.  He is unable to describe it but it is severe and he rates it at 10/10.  It is similar to pain he has had heart problems in the past.  He took aspirin and 3 nitroglycerin without relief.  The nitroglycerin prescription is recent and the nitroglycerin did burn under his tongue.  He did have some associated dyspnea, nausea, diaphoresis.  His last dialysis was yesterday.   Past Medical History:  Diagnosis Date   Abscess of right foot    Acute osteomyelitis of right foot ([place]) 05/15/2019   Anemia 06/23/2019   Cellulitis of right foot 05/22/2019   Chest pain 08/23/2013   Chills    at night - sometimes   Complication of anesthesia    Coronary artery disease    Diabetes mellitus    controlled by diet   Diabetes with renal manifestations(250.4) 08/23/2013   Diabetic foot ulcer- right second toe 05/14/2019   Diabetic infection of right foot ([place])    Diabetic retinopathy associated with type 2 diabetes mellitus ([place]) 06/23/2019   Eczema    ESRD on hemodialysis ([place]) 07/18/2011   Essential hypertension 10/18/2007   Qualifier: Diagnosis of  By: Loanne Drilling MD, Sean A    Fatigue    loss of fatigue   FOOT ULCER, LEFT 06/29/2008   Qualifier: Diagnosis of  By: Loanne Drilling MD, Sean A    Gangrene of toe of right foot ([place])    Headache(784.0)    Years ago   Heart murmur    years ago   History of blood transfusion    Hyperglycemia 05/05/2015   HYPERLIPIDEMIA 05/16/2008   Qualifier: Diagnosis of  By:  Jenny Reichmann MD, Hunt Oris    Hypoglycemia, unspecified 11/13/2008   Qualifier: Diagnosis of  By: Jenny Reichmann MD, Hunt Oris    MRSA (methicillin resistant staph aureus) culture positive    MRSA INFECTION 10/18/2007   Qualifier: Diagnosis of  By: Marca Ancona RMA, Lucy     Myocardial infarction ([place])    Obesity, unspecified 08/23/2013   Osteomyelitis of great toe of right foot ([place]) 05/14/2019   Other forms of retinal detachment(361.89) 06/23/2019   PONV (postoperative nausea and vomiting)    Poor circulation    Secondary hyperparathyroidism ([place]) 06/23/2019   Sepsis ([place]) 05/14/2019   Type 2 diabetes mellitus ([place]) 08/23/2013   Ulcer of right foot, with necrosis of bone ([place])    Ulcer of right second toe with necrosis of bone [facility])     Patient Active Problem List   Diagnosis Date Noted   Unstable angina ([place]) 08/20/2021   Nuclear sclerotic cataract of right eye 06/11/2021   Nuclear sclerotic cataract of left eye 06/11/2021   Posterior subcapsular age-related cataract, left eye 06/11/2021   Stable treated proliferative diabetic retinopathy of left eye determined by examination associated with type 2 diabetes mellitus ([place]) 06/11/2021   Right epiretinal membrane 06/11/2021   Stable treated proliferative diabetic retinopathy of right eye determined by examination associated with type 2 diabetes mellitus (  [place]) 06/11/2021   Hyperkalemia 01/09/2021   ESRD (end stage renal disease) ([place])    Dialysis AV fistula malfunction, initial encounter ([place]) 01/07/2021   NSTEMI (non-ST elevated myocardial infarction) ([place]) 09/21/2020   3-vessel coronary artery disease 08/09/2020   Pulmonary hypertension, unspecified ([place]) 08/09/2020   Pancytopenia ([place]) 08/06/2020   Allergy, unspecified, subsequent encounter 06/28/2020   Anaphylactic shock, unspecified, subsequent encounter 06/28/2020   Lymphedema of both lower extremities 12/29/2019   Hyperphosphatemia 12/11/2019   Encounter for post surgical wound check 08/16/2019   Post-op  pain 08/16/2019   S/P transmetatarsal amputation of foot, right ([place]) 08/16/2019   Unspecified protein-calorie malnutrition ([place]) 07/12/2019   PAD (peripheral artery disease) ([place]) 06/30/2019   Diabetic retinopathy associated with type 2 diabetes mellitus ([place]) 06/23/2019   Other forms of retinal detachment(361.89) 06/23/2019   Secondary hyperparathyroidism ([place]) 06/23/2019   Osteomyelitis, unspecified ([place]) 05/31/2019   Gangrene of toe of right foot (HCC)    Ulcer of right foot, with necrosis of bone ([place])    Abscess of right foot    Diabetic infection of right foot ([place])    Ulcer of right second toe with necrosis of bone ([place])    Cellulitis of right foot 05/22/2019   Acute osteomyelitis of right foot ([place]) 05/15/2019   Osteomyelitis of great toe of right foot ([place]) 05/14/2019   Diabetic foot ulcer- right second toe 05/14/2019   Sepsis ([place]) 05/14/2019   Hypothyroidism, unspecified 01/11/2018   Hypercalcemia 06/14/2016   Hyperlipidemia 05/11/2015   Hyperglycemia 05/05/2015   Diarrhea, unspecified 02/01/2015   Fever, unspecified 02/01/2015   Pain, unspecified 02/01/2015   Pruritus, unspecified 02/01/2015   Coagulation defect, unspecified ([place]) 07/07/2014   Encounter for removal of sutures 05/30/2014   Encounter for immunization 04/19/2014   Chest pain 08/23/2013   Type 2 diabetes mellitus ([place]) 08/23/2013   Hemodialysis status ([place]) 08/23/2013   Chronic kidney disease, stage V ([place]) 08/23/2013   Obesity, unspecified 45/80/9983   Complication of vascular dialysis catheter 12/15/2012   Other fluid overload 12/07/2012   Other specified complication of vascular prosthetic devices, implants and grafts, subsequent encounter 08/06/2012   ESRD on dialysis ([place]) 07/18/2011   Pure hypercholesterolemia 06/23/2011   Iron deficiency anemia, unspecified 06/21/2011   HYPOGLYCEMIA, UNSPECIFIED 11/13/2008   RENAL INSUFFICIENCY 07/31/2008   FOOT ULCER, LEFT 06/29/2008   HYPERLIPIDEMIA  05/16/2008   MRSA INFECTION 10/18/2007   Essential hypertension 10/18/2007    Past Surgical History:  Procedure Laterality Date   ABDOMINAL AORTOGRAM N/A 05/17/2019   Procedure: ABDOMINAL AORTOGRAM;  Surgeon: Elam Dutch, MD;  Location: [place] [facility];  Service: Cardiovascular;  Laterality: N/A;   AMPUTATION TOE Right 05/14/2019   Procedure: PARTIAL AMPUTATION SECOND TOE RIGHT FOOT;  Surgeon: Evelina Bucy, DPM;  Location: [place];  Service: Podiatry;  Laterality: Right;   AMPUTATION TOE Right 05/23/2019   Procedure: AMPUTATION OF SECOND TOE METATARSAL PHALANGEAL JOINT;  Surgeon: Evelina Bucy, DPM;  Location: [place];  Service: Podiatry;  Laterality: Right;   AMPUTATION TOE Right 05/27/2019   Procedure: Amputation Toe;  Surgeon: Evelina Bucy, DPM;  Location: [place];  Service: Podiatry;  Laterality: Right;  right third toe   APPLICATION OF WOUND VAC Right 05/27/2019   Procedure: Application Of Wound Vac;  Surgeon: Evelina Bucy, DPM;  Location: [place];  Service: Podiatry;  Laterality: Right;   AV FISTULA PLACEMENT  06-18-11   Right brachiocephalic AVF   BONE BIOPSY Right 05/14/2019   Procedure: OPEN  SUPERFICIAL BONE BIOPSY GREAT TOE;  Surgeon: Evelina Bucy, DPM;  Location: [place];  Service: Podiatry;  Laterality: Right;   CARDIAC CATHETERIZATION     CORONARY BALLOON ANGIOPLASTY N/A 09/24/2020   Procedure: CORONARY BALLOON ANGIOPLASTY;  Surgeon: Martinique, Peter M, MD;  Location: [place] [facility];  Service: Cardiovascular;  Laterality: N/A;   CORONARY STENT INTERVENTION N/A 09/24/2020   Procedure: CORONARY STENT INTERVENTION;  Surgeon: Martinique, Peter M, MD;  Location: [place] [facility];  Service: Cardiovascular;  Laterality: N/A;   EYE SURGERY     left eye for Laser, diabetic retinopathy   FISTULOGRAM Right 11/06/2011   Procedure: FISTULOGRAM;  Surgeon: Conrad Colusa, MD;  Location: [facility] LAB;  Service: Cardiovascular;  Laterality: Right;   HEMATOMA EVACUATION Right Feb. 25, 2014    HEMATOMA EVACUATION Right 12/14/2012   Procedure: EVACUATION HEMATOMA;  Surgeon: Conrad Penobscot, MD;  Location: [place];  Service: Vascular;  Laterality: Right;   INCISION AND DRAINAGE Right 05/23/2019   Procedure: INCISION AND DRAINAGE OF RIGHT FOOT DEEP SPACE ABSCESS;  Surgeon: Evelina Bucy, DPM;  Location: [place];  Service: Podiatry;  Laterality: Right;   INSERTION OF DIALYSIS CATHETER  12/14/2012   Procedure: INSERTION OF DIALYSIS CATHETER;  Surgeon: Conrad [place] Rock, MD;  Location: [place];  Service: Vascular;;   INSERTION OF DIALYSIS CATHETER Right 01/07/2021   Procedure: INSERTION OF DIALYSIS CATHETER;  Surgeon: Elam Dutch, MD;  Location: [place];  Service: Vascular;  Laterality: Right;   INSERTION OF DIALYSIS CATHETER Left 01/08/2021   Procedure: INSERTION OF LEFT INTERNAL JUGULAR DIALYSIS CATHETER, tunneled;  Surgeon: Angelia Mould, MD;  Location: [place];  Service: Vascular;  Laterality: Left;   INTRAVASCULAR PRESSURE WIRE/FFR STUDY N/A 09/24/2020   Procedure: INTRAVASCULAR PRESSURE WIRE/FFR STUDY;  Surgeon: Martinique, Peter M, MD;  Location: [place] [facility];  Service: Cardiovascular;  Laterality: N/A;   IRRIGATION AND DEBRIDEMENT FOOT Right 05/25/2019   Procedure: INCISION AND DRAINAGE FOOT;  Surgeon: Evelina Bucy, DPM;  Location: [place];  Service: Podiatry;  Laterality: Right;   IRRIGATION AND DEBRIDEMENT FOOT Right 05/27/2019   Procedure: IRRIGATION AND DEBRIDEMENT FOOT;  Surgeon: Evelina Bucy, DPM;  Location: [place];  Service: Podiatry;  Laterality: Right;   LEFT HEART CATH AND CORONARY ANGIOGRAPHY N/A 09/24/2020   Procedure: LEFT HEART CATH AND CORONARY ANGIOGRAPHY;  Surgeon: Martinique, Peter M, MD;  Location: [place] [facility];  Service: Cardiovascular;  Laterality: N/A;   LOWER EXTREMITY ANGIOGRAPHY Bilateral 05/17/2019   Procedure: LOWER EXTREMITY ANGIOGRAPHY;  Surgeon: Elam Dutch, MD;  Location: [place] [facility];  Service: Cardiovascular;  Laterality: Bilateral;    REMOVAL OF A DIALYSIS CATHETER Right 01/08/2021   Procedure: REMOVAL OF TEMPORARY DIALYSIS CATHETER, LEFT FEMORAL VEIN;  Surgeon: Angelia Mould, MD;  Location: [facility];  Service: Vascular;  Laterality: Right;   REVISON OF ARTERIOVENOUS FISTULA Right 12/14/2012   Procedure: REVISON OF upper arm ARTERIOVENOUS FISTULA using 57mx10cm gortex graft;  Surgeon: BConrad Butte MD;  [place]  Service: Vascular;  Laterality: Right;   REVISON OF ARTERIOVENOUS FISTULA Right 12/27/2020   Procedure: RIGHT UPPER EXTREMITY ARTERIOVENOUS FISTULA REVISiON;  Surgeon: HCherre Robins MD;  [facility];  Service: Vascular;  Laterality: Right;  PERIPHERAL NERVE BLOCK   RIGHT/LEFT HEART CATH AND CORONARY ANGIOGRAPHY N/A 08/07/2020   Procedure: RIGHT/LEFT HEART CATH AND CORONARY ANGIOGRAPHY;  Surgeon: JMartinique Peter M, MD;  [facility];  Service: Cardiovascular;  Laterality: N/A;   SOFT TISSUE MASS EXCISION     Right arm, Left leg  for MRSA infection       Family History  Problem Relation Age of Onset   Diabetes Mother     Social History   Tobacco Use   Smoking status: Never   Smokeless tobacco: Never  Vaping Use   Vaping Use: Never used  Substance Use Topics   Alcohol use: No   Drug use: No    Home Medications Prior to Admission medications   Medication Sig Start Date End Date Taking? Authorizing Provider  amLODipine (NORVASC) 10 MG tablet Take 10 mg by mouth at bedtime.  08/03/20   [provider]  aspirin EC 81 MG tablet Take 1 tablet (81 mg total) by mouth daily. 06/28/19   Wellington Hampshire, MD  blood glucose meter kit and supplies Dispense based on patient and insurance preference. Use up to four times daily as directed. (FOR ICD-10 E10.9, E11.9). 05/18/19   Mikhail, Velta Addison, DO  blood glucose meter kit and supplies Dispense based on patient and insurance preference. Use up to four times daily as directed. (FOR ICD-9 250.00, 250.01). 05/18/19   Cristal Ford,  DO  Continuous Blood Gluc Sensor (FREESTYLE LIBRE 14 DAY SENSOR) MISC See admin instructions. 06/20/19   [provider]  glucose blood (ACCU-CHEK AVIVA PLUS) test strip Use as instructed 05/18/19   Cristal Ford, DO  hydrALAZINE (APRESOLINE) 50 MG tablet Take 50 mg by mouth 3 (three) times daily. 12/18/20   [provider]  isosorbide dinitrate (ISORDIL) 20 MG tablet Take 20 mg by mouth 3 (three) times daily. 08/24/20   [provider]  Lancet Devices [facility]) lancets Use as instructed 05/18/19   Cristal Ford, DO  lidocaine-prilocaine (EMLA) cream Apply 1 application topically as needed. Patient taking differently: Apply 1 application topically as needed (port access). 01/02/21   Cherre Robins, MD  LOKELMA 10 g PACK packet Take 10 g by mouth. Patient says he takes on HD days 01/14/21   [provider]  metoprolol tartrate (LOPRESSOR) 25 MG tablet Take 25 mg by mouth 2 (two) times daily.    [provider]  propranolol (INDERAL) 80 MG tablet Take 1 tablet (80 mg total) by mouth 2 (two) times daily. Patient not taking: Reported on 06/21/2021 09/26/20   Modena Jansky, MD  sevelamer carbonate (RENVELA) 800 MG tablet Take 1,600 mg by mouth 3 (three) times daily with meals. 07/25/20   [provider]  ticagrelor (BRILINTA) 90 MG TABS tablet TAKE 1 TABLET (90 MG TOTAL) BY MOUTH TWO TIMES DAILY. 09/26/20 09/26/21  Modena Jansky, MD  loratadine (CLARITIN) 10 MG tablet Take 10 mg by mouth daily as needed for allergies.  11/06/19  [provider]    Allergies    Atorvastatin, Rosuvastatin, Amoxicillin, Chlorhexidine, Hydromorphone, Percocet [oxycodone-acetaminophen], and Gabapentin  Review of Systems   Review of Systems  Cardiovascular:  Positive for chest pain.  All other systems reviewed and are negative.  Physical Exam Updated Vital Signs BP (!) 144/121 (BP Location: Left Arm)   Pulse 96   Temp 98.6 F (37 C)   Resp  17   SpO2 99%   Physical Exam Vitals and nursing note reviewed.  55 year old male, appears uncomfortable, but is in no acute distress. Vital signs are significant for elevated blood pressure. Oxygen saturation is 99%, which is normal. Head is normocephalic and atraumatic. PERRLA, EOMI. Oropharynx  is clear. Neck is nontender and supple without adenopathy or JVD. Back is nontender and there is no CVA tenderness. Lungs are clear without rales, wheezes, or rhonchi. Chest is nontender. Heart has regular rate and rhythm without murmur. Abdomen is soft, flat, nontender. Extremities: AV fistula present right upper arm with thrill present.  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 Skin is warm and dry without rash. Neurologic: Mental status is normal, cranial nerves are intact, moves all extremities equally.  ED Results / Procedures / Treatments   Labs (all labs ordered are listed, but only abnormal results are displayed) Labs Reviewed  BASIC METABOLIC PANEL - Abnormal; Notable for the following components:      Result Value   Chloride 92 (*)    Glucose, Bld 276 (*)    BUN 33 (*)    Creatinine, Ser 6.75 (*)    GFR, Estimated 9 (*)    All other components within normal limits  CBC - Abnormal; Notable for the following components:   RBC 3.82 (*)    Hemoglobin 10.8 (*)    HCT 34.2 (*)    RDW 17.8 (*)    All other components within normal limits  TROPONIN I (HIGH SENSITIVITY) - Abnormal; Notable for the following components:   Troponin I (High Sensitivity) 29 (*)    All other components within normal limits    EKG EKG Interpretation  Date/Time:  Thursday September 12 2021 04:46:41 EST Ventricular Rate:  94 PR Interval:  142 QRS Duration: 104 QT Interval:  404 QTC Calculation: 505 R Axis:   20 Text Interpretation: Normal sinus rhythm Left ventricular hypertrophy with repolarization abnormality ( R in aVL , Cornell product ) Prolonged QT  Abnormal ECG When compared with ECG of 08/20/2021, REPOLARIZATION ABNORMALITY is now present QT has lengthened Confirmed by Delora Fuel ([id number]) on 09/12/2021 5:52:38 AM  Radiology DG Chest 2 View  Result Date: 09/12/2021 CLINICAL DATA:  Chest pain EXAM: CHEST - 2 VIEW COMPARISON:  08/20/2021 FINDINGS: Generous heart size accentuated by low lung volumes. Indistinct perihilar opacity. No pleural fluid or pneumothorax. Right subclavian stenting. IMPRESSION: 1. Indistinct perihilar opacity which could be edema or atelectasis. 2. Chronic cardiomegaly. Electronically Signed   By: Jorje Guild M.D.   On: 09/12/2021 05:36    Procedures Procedures  CRITICAL CARE Performed by: Delora Fuel Total critical care time: 50 minutes Critical care time was exclusive of separately billable procedures and treating other patients. Critical care was necessary to treat or prevent imminent or life-threatening deterioration. Critical care was time spent personally by me on the following activities: development of treatment plan with patient and/or surrogate as well as nursing, discussions with consultants, evaluation of patient's response to treatment, examination of patient, obtaining history from patient or surrogate, ordering and performing treatments and interventions, ordering and review of laboratory studies, ordering and review of radiographic studies, pulse oximetry and re-evaluation of patient's condition.  Medications Ordered in ED Medications  nitroGLYCERIN 50 mg in dextrose 5 % 250 mL (0.2 mg/mL) infusion (10 mcg/min Intravenous New Bag/Given 09/12/21 0704)  heparin ADULT infusion 100 units/mL (25000 units/217m) (1,100 Units/hr Intravenous New Bag/Given 09/12/21 0707)  HYDROmorphone (DILAUDID) injection 1 mg (has no administration in time range)  LORazepam (ATIVAN) injection 0.5 mg (0.5 mg Intravenous Given 09/12/21 0618)  morphine 4 MG/ML injection 4 mg (4 mg Intravenous Given 09/12/21 0618)   diphenhydrAMINE (BENADRYL) injection 25 mg (25 mg Intravenous Given 09/12/21 0618)  heparin bolus  via infusion 3,000 Units (3,000 Units Intravenous Bolus from Bag 09/12/21 0708)    ED Course  I have reviewed the triage vital signs and the nursing notes.  Pertinent labs & imaging results that were available during my care of the patient were reviewed by me and considered in my medical decision making (see chart for details).   MDM Rules/Calculators/A&P                         Chest pain worrisome for ACS.  Old records are reviewed, and he was at [facility] 3 weeks ago at which time cardiac catheterization showed severe three-vessel disease with out any PCI target, but there was a stent in the ostial segment of the second diagonal branch off of the LAD which had 80% in-stent stenosis.  ECG shows some minor repolarization changes which had not been present previously.  Troponin is mildly elevated at 29, but this is similar to her range he has been at in the past, presumably secondary to his underlying renal disease.  Anemia is present which is unchanged from baseline.  Chest x-ray shows chronic cardiomegaly, atelectasis versus edema in the perihilar region.  While I was evaluating him, he had a brief generalized seizure.  He is given a dose of lorazepam and is also given morphine and started on heparin drip and nitroglycerin drip.  Chest pain is modestly improved with morphine and nitroglycerin drip, but he definitely looks a lot more comfortable.  Case has been discussed with Dr. Margaretann Loveless of cardiology service who agrees to see the patient in consultation but wishes the patient be admitted on the internal medicine service.  Hospitalist will be paged.  Final Clinical Impression(s) / ED Diagnoses Final diagnoses:  Acute coronary syndrome ([place])  End-stage renal disease on hemodialysis ([place])  Anemia associated with chronic renal failure    Rx / DC Orders ED Discharge Orders      None        Delora Fuel, MD 35/24/81 [id number]    Delora Fuel, MD 93/11/21 [phone]

## 2021-09-12 NOTE — Progress Notes (Signed)
ANTICOAGULATION CONSULT NOTE - Initial Consult  Pharmacy Consult for heparin Indication: chest pain/ACS  Allergies  Allergen Reactions   Atorvastatin     Other reaction(s): Muscle Pain   Rosuvastatin     Other reaction(s): Muscle Pain   Amoxicillin Itching    Did it involve swelling of the face/tongue/throat, SOB, or low BP? Unknown Did it involve sudden or severe rash/hives, skin peeling, or any reaction on the inside of your mouth or nose? Unknown Did you need to seek medical attention at a hospital or doctor's office? Unknown When did it last happen?      20 years ago If all above answers are "NO", may proceed with cephalosporin use.    Chlorhexidine Hives    Patient has blistering   Hydromorphone Itching    Patient states he may take with benadryl. Makes feet itch.   Percocet [Oxycodone-Acetaminophen] Itching and Other (See Comments)    Legs only. Itching and burning feeling.   Gabapentin Nausea And Vomiting    Stomach issues     Patient Measurements: Height: 5\' 10"  (177.8 cm) Weight: 88 kg (194 lb 0.1 oz) IBW/kg (Calculated) : 73  Vital Signs: Temp: 98.6 F (37 C) (11/24 0500) BP: 144/121 (11/24 0500) Pulse Rate: 96 (11/24 0500)  Labs: Recent Labs    09/12/21 0507  HGB 10.8*  HCT 34.2*  PLT 356  CREATININE 6.75*  TROPONINIHS 29*    Estimated Creatinine Clearance: 13.8 mL/min (A) (by C-G formula based on SCr of 6.75 mg/dL (H)).   Medical History: Past Medical History:  Diagnosis Date   Abscess of right foot    Acute osteomyelitis of right foot ([place]) 05/15/2019   Anemia 06/23/2019   Cellulitis of right foot 05/22/2019   Chest pain 08/23/2013   Chills    at night - sometimes   Complication of anesthesia    Coronary artery disease    Diabetes mellitus    controlled by diet   Diabetes with renal manifestations(250.4) 08/23/2013   Diabetic foot ulcer- right second toe 05/14/2019   Diabetic infection of right foot ([place])    Diabetic retinopathy associated  with type 2 diabetes mellitus ([name]) 06/23/2019   Eczema    ESRD on hemodialysis ([place]) 07/18/2011   Essential hypertension 10/18/2007   Qualifier: Diagnosis of  By: Loanne Drilling MD, Sean A    Fatigue    loss of fatigue   FOOT ULCER, LEFT 06/29/2008   Qualifier: Diagnosis of  By: Loanne Drilling MD, Sean A    Gangrene of toe of right foot ([place])    Headache(784.0)    Years ago   Heart murmur    years ago   History of blood transfusion    Hyperglycemia 05/05/2015   HYPERLIPIDEMIA 05/16/2008   Qualifier: Diagnosis of  By: Jenny Reichmann MD, Hunt Oris    Hypoglycemia, unspecified 11/13/2008   Qualifier: Diagnosis of  By: Jenny Reichmann MD, Hunt Oris    MRSA (methicillin resistant staph aureus) culture positive    MRSA INFECTION 10/18/2007   Qualifier: Diagnosis of  By: Marca Ancona RMA, Lucy     Myocardial infarction ([place])    Obesity, unspecified 08/23/2013   Osteomyelitis of great toe of right foot ([place]) 05/14/2019   Other forms of retinal detachment(361.89) 06/23/2019   PONV (postoperative nausea and vomiting)    Poor circulation    Secondary hyperparathyroidism ([place]) 06/23/2019   Sepsis ([place]) 05/14/2019   Type 2 diabetes mellitus ([place]) 08/23/2013   Ulcer of right foot, with necrosis of bone ([place])  Ulcer of right second toe with necrosis of bone ([facility])     Assessment: 55yo male c/o CP, initial troponin mildly elevated, to begin heparin.  Goal of Therapy:  Heparin level 0.3-0.7 units/ml Monitor platelets by anticoagulation protocol: Yes   Plan:  Heparin 3000 units IV bolus x1 followed by infusion at 1100 units/hr and monitor heparin levels and CBC.  Wynona Neat, PharmD, BCPS  09/12/2021,6:20 AM

## 2021-09-12 NOTE — ED Notes (Signed)
ED TO INPATIENT HANDOFF REPORT  ED Nurse Name and Phone #: Mel Almond ED RN [phone]  S Name/Age/Gender [name] 55 y.o. male Room/Bed: 038C/038C  Code Status   Code Status: Full Code  Home/SNF/Other Home Patient oriented to: self, place, time, and situation Is this baseline? Yes   Triage Complete: Triage complete  Chief Complaint ACS (acute coronary syndrome) ([place]) [I24.9]  Triage Note Pt comes via [facility] for CP that started last night, PTA received 324 ASA, 3 nitro and 4mg  zofran that helped with the pain, pt is also c/o of itching in his feet.    Allergies Allergies  Allergen Reactions   Atorvastatin     Other reaction(s): Muscle Pain   Rosuvastatin     Other reaction(s): Muscle Pain   Amoxicillin Itching    Did it involve swelling of the face/tongue/throat, SOB, or low BP? Unknown Did it involve sudden or severe rash/hives, skin peeling, or any reaction on the inside of your mouth or nose? Unknown Did you need to seek medical attention at a hospital or doctor's office? Unknown When did it last happen?      20 years ago If all above answers are "NO", may proceed with cephalosporin use.    Chlorhexidine Hives    Patient has blistering   Hydromorphone Itching    Patient states he may take with benadryl. Makes feet itch.   Percocet [Oxycodone-Acetaminophen] Itching and Other (See Comments)    Legs only. Itching and burning feeling.   Morphine     Other reaction(s): rash/itching All narcotics per pt   Gabapentin Nausea And Vomiting    Stomach issues     Level of Care/Admitting Diagnosis ED Disposition     ED Disposition  Admit   Condition  --   [place]: [place] [[id number]]  Level of Care: Progressive [102]  Admit to Progressive based on following criteria: CARDIOVASCULAR & THORACIC of moderate stability with acute coronary syndrome symptoms/low risk myocardial infarction/hypertensive urgency/arrhythmias/heart failure  potentially compromising stability and stable post cardiovascular intervention patients.  May place patient in observation at [facility] or [facility] if equivalent level of care is available:: No  Covid Evaluation: Asymptomatic Screening Protocol (No Symptoms)  Diagnosis: ACS (acute coronary syndrome) [facility]) [[id number]]  Admitting Physician: Karmen Bongo [[id number]]  Attending Physician: Karmen Bongo [[id number]]          B Medical/Surgery History Past Medical History:  Diagnosis Date   Acute osteomyelitis of right foot ([place]) 05/15/2019   Anemia 64/40/3474   Complication of anesthesia    Coronary artery disease    Diabetes with renal manifestations(250.4) 08/23/2013   Diabetic infection of right foot ([place])    Diabetic retinopathy associated with type 2 diabetes mellitus ([place]) 06/23/2019   ESRD on hemodialysis ([place]) 07/18/2011   Essential hypertension 10/18/2007   Qualifier: Diagnosis of  By: Loanne Drilling MD, Hilliard Clark A    History of blood transfusion    HYPERLIPIDEMIA 05/16/2008   Qualifier: Diagnosis of  By: Jenny Reichmann MD, Hunt Oris    MRSA INFECTION 10/18/2007   Qualifier: Diagnosis of  By: Marca Ancona RMA, Lucy     Other forms of retinal detachment(361.89) 06/23/2019   PONV (postoperative nausea and vomiting)    Secondary hyperparathyroidism ([place]) 06/23/2019   Sepsis ([place]) 05/14/2019   Past Surgical History:  Procedure Laterality Date   ABDOMINAL AORTOGRAM N/A 05/17/2019   Procedure: ABDOMINAL AORTOGRAM;  Surgeon: Elam Dutch, MD;  Location: [facility];  Service:  Cardiovascular;  Laterality: N/A;   AMPUTATION TOE Right 05/14/2019   Procedure: PARTIAL AMPUTATION SECOND TOE RIGHT FOOT;  Surgeon: Evelina Bucy, DPM;  Location: [place];  Service: Podiatry;  Laterality: Right;   AMPUTATION TOE Right 05/23/2019   Procedure: AMPUTATION OF SECOND TOE METATARSAL PHALANGEAL JOINT;  Surgeon: Evelina Bucy, DPM;  Location: [place];  Service: Podiatry;  Laterality: Right;   AMPUTATION TOE Right  05/27/2019   Procedure: Amputation Toe;  Surgeon: Evelina Bucy, DPM;  Location: [place];  Service: Podiatry;  Laterality: Right;  right third toe   APPLICATION OF WOUND VAC Right 05/27/2019   Procedure: Application Of Wound Vac;  Surgeon: Evelina Bucy, DPM;  Location: [facility];  Service: Podiatry;  Laterality: Right;   AV FISTULA PLACEMENT  06-18-11   Right brachiocephalic AVF   BONE BIOPSY Right 05/14/2019   Procedure: OPEN SUPERFICIAL BONE BIOPSY GREAT TOE;  Surgeon: Evelina Bucy, DPM;  Location: [place];  Service: Podiatry;  Laterality: Right;   CARDIAC CATHETERIZATION     CORONARY BALLOON ANGIOPLASTY N/A 09/24/2020   Procedure: CORONARY BALLOON ANGIOPLASTY;  Surgeon: Martinique, Peter M, MD;  Location: [facility];  Service: Cardiovascular;  Laterality: N/A;   CORONARY STENT INTERVENTION N/A 09/24/2020   Procedure: CORONARY STENT INTERVENTION;  Surgeon: Martinique, Peter M, MD;  Location: [place] [facility];  Service: Cardiovascular;  Laterality: N/A;   EYE SURGERY     left eye for Laser, diabetic retinopathy   FISTULOGRAM Right 11/06/2011   Procedure: FISTULOGRAM;  Surgeon: Conrad Dunnavant, MD;  Location: [facility];  Service: Cardiovascular;  Laterality: Right;   HEMATOMA EVACUATION Right Feb. 25, 2014   HEMATOMA EVACUATION Right 12/14/2012   Procedure: EVACUATION HEMATOMA;  Surgeon: Conrad Fayette, MD;  Location: [place];  Service: Vascular;  Laterality: Right;   INCISION AND DRAINAGE Right 05/23/2019   Procedure: INCISION AND DRAINAGE OF RIGHT FOOT DEEP SPACE ABSCESS;  Surgeon: Evelina Bucy, DPM;  Location: [place];  Service: Podiatry;  Laterality: Right;   INSERTION OF DIALYSIS CATHETER  12/14/2012   Procedure: INSERTION OF DIALYSIS CATHETER;  Surgeon: Conrad Eureka Mill, MD;  Location: [place];  Service: Vascular;;   INSERTION OF DIALYSIS CATHETER Right 01/07/2021   Procedure: INSERTION OF DIALYSIS CATHETER;  Surgeon: Elam Dutch, MD;  Location: [place];  Service: Vascular;  Laterality: Right;    INSERTION OF DIALYSIS CATHETER Left 01/08/2021   Procedure: INSERTION OF LEFT INTERNAL JUGULAR DIALYSIS CATHETER, tunneled;  Surgeon: Angelia Mould, MD;  Location: [place];  Service: Vascular;  Laterality: Left;   INTRAVASCULAR PRESSURE WIRE/FFR STUDY N/A 09/24/2020   Procedure: INTRAVASCULAR PRESSURE WIRE/FFR STUDY;  Surgeon: Martinique, Peter M, MD;  Location: [facility];  Service: Cardiovascular;  Laterality: N/A;   IRRIGATION AND DEBRIDEMENT FOOT Right 05/25/2019   Procedure: INCISION AND DRAINAGE FOOT;  Surgeon: Evelina Bucy, DPM;  Location: [place];  Service: Podiatry;  Laterality: Right;   IRRIGATION AND DEBRIDEMENT FOOT Right 05/27/2019   Procedure: IRRIGATION AND DEBRIDEMENT FOOT;  Surgeon: Evelina Bucy, DPM;  Location: [place];  Service: Podiatry;  Laterality: Right;   LEFT HEART CATH AND CORONARY ANGIOGRAPHY N/A 09/24/2020   Procedure: LEFT HEART CATH AND CORONARY ANGIOGRAPHY;  Surgeon: Martinique, Peter M, MD;  Location: [place] [facility];  Service: Cardiovascular;  Laterality: N/A;   LOWER EXTREMITY ANGIOGRAPHY Bilateral 05/17/2019   Procedure: LOWER EXTREMITY ANGIOGRAPHY;  Surgeon: Elam Dutch, MD;  Location: [facility];  Service: Cardiovascular;  Laterality: Bilateral;   REMOVAL OF A DIALYSIS CATHETER Right 01/08/2021   Procedure: REMOVAL OF TEMPORARY DIALYSIS CATHETER, LEFT FEMORAL VEIN;  Surgeon: Angelia Mould, MD;  Location: [facility];  Service: Vascular;  Laterality: Right;   REVISON OF ARTERIOVENOUS FISTULA Right 12/14/2012   Procedure: REVISON OF upper arm ARTERIOVENOUS FISTULA using 30mmx10cm gortex graft;  Surgeon: Conrad Vance, MD;  Location: [place];  Service: Vascular;  Laterality: Right;   REVISON OF ARTERIOVENOUS FISTULA Right 12/27/2020   Procedure: RIGHT UPPER EXTREMITY ARTERIOVENOUS FISTULA REVISiON;  Surgeon: Cherre Robins, MD;  Location: [facility];  Service: Vascular;  Laterality: Right;  PERIPHERAL NERVE BLOCK   RIGHT/LEFT HEART CATH AND CORONARY  ANGIOGRAPHY N/A 08/07/2020   Procedure: RIGHT/LEFT HEART CATH AND CORONARY ANGIOGRAPHY;  Surgeon: Martinique, Peter M, MD;  Location: [place] [facility];  Service: Cardiovascular;  Laterality: N/A;   SOFT TISSUE MASS EXCISION     Right arm, Left leg  for MRSA infection     A IV Location/Drains/Wounds Patient Lines/Drains/Airways Status     Active Line/Drains/Airways     Name Placement date Placement time Site Days   Peripheral IV 09/12/21 Left Forearm 09/12/21  0457  Forearm  less than 1   Fistula / Graft Right Upper arm Arteriovenous fistula --  --  Upper arm  --   Hemodialysis Catheter Right Femoral vein Triple lumen Temporary (Non-Tunneled) 01/07/21  1656  Femoral vein  248   Hemodialysis Catheter Left Internal jugular Double lumen Permanent (Tunneled) 01/08/21  1827  Internal jugular  247   Negative Pressure Wound Therapy Foot Right 05/27/19  1847  --  839   Incision (Closed) 05/27/19 Foot Right 05/27/19  1816  -- 839   Incision (Closed) 12/27/20 Arm Right 12/27/20  1243  -- 259   Incision (Closed) 01/07/21 Groin Right 01/07/21  1703  -- 248   Incision (Closed) 01/08/21 Chest Left 01/08/21  1832  -- 247   Incision (Closed) 01/08/21 Neck Left 01/08/21  1832  -- 247   Pressure Injury 09/21/20 Foot Anterior;Right Unstageable - Full thickness tissue loss in which the base of the injury is covered by slough (yellow, tan, gray, green or brown) and/or eschar (tan, brown or black) in the wound bed. 09/21/20  2000  -- 356            Intake/Output Last 24 hours No intake or output data in the 24 hours ending 09/12/21 1051  Labs/Imaging Results for orders placed or performed during the hospital encounter of 09/12/21 (from the past 48 hour(s))  Basic metabolic panel     Status: Abnormal   Collection Time: 09/12/21  5:07 AM  Result Value Ref Range   Sodium 136 135 - 145 mmol/L   Potassium 4.5 3.5 - 5.1 mmol/L   Chloride 92 (L) 98 - 111 mmol/L   CO2 29 22 - 32 mmol/L   Glucose, Bld 276  (H) 70 - 99 mg/dL    Comment: Glucose reference range applies only to samples taken after fasting for at least 8 hours.   BUN 33 (H) 6 - 20 mg/dL   Creatinine, Ser 6.75 (H) 0.61 - 1.24 mg/dL   Calcium 9.9 8.9 - 10.3 mg/dL   GFR, Estimated 9 (L) >60 mL/min    Comment: (NOTE) Calculated using the CKD-EPI Creatinine Equation (2021)    Anion gap 15 5 - 15    Comment: Performed at [facility]  [address], [place], [place] [address]  CBC     Status: Abnormal   Collection Time: 09/12/21  5:07 AM  Result Value Ref Range   WBC 7.5 4.0 - 10.5 K/uL   RBC 3.82 (L) 4.22 - 5.81 MIL/uL   Hemoglobin 10.8 (L) 13.0 - 17.0 g/dL   HCT 34.2 (L) 39.0 - 52.0 %   MCV 89.5 80.0 - 100.0 fL   MCH 28.3 26.0 - 34.0 pg   MCHC 31.6 30.0 - 36.0 g/dL   RDW 17.8 (H) 11.5 - 15.5 %   Platelets 356 150 - 400 K/uL   nRBC 0.0 0.0 - 0.2 %    Comment: Performed at [facility], [place] [address], [place], [place] [address]  Troponin I (High Sensitivity)     Status: Abnormal   Collection Time: 09/12/21  5:07 AM  Result Value Ref Range   Troponin I (High Sensitivity) 29 (H) <18 ng/L    Comment: (NOTE) Elevated high sensitivity troponin I (hsTnI) values and significant  changes across serial measurements may suggest ACS but many other  chronic and acute conditions are known to elevate hsTnI results.  Refer to the "Links" section for chest pain algorithms and additional  guidance. Performed at [facility], [place] [address], [place], [place] [address]   Troponin I (High Sensitivity)     Status: Abnormal   Collection Time: 09/12/21  7:49 AM  Result Value Ref Range   Troponin I (High Sensitivity) 46 (H) <18 ng/L    Comment: (NOTE) Elevated high sensitivity troponin I (hsTnI) values and significant  changes across serial measurements may suggest ACS but many other  chronic and acute conditions are known to elevate hsTnI results.  Refer to the "Links" section for chest pain algorithms and additional   guidance. Performed at [facility], [place] [address], [place], [place] [address]    DG Chest 2 View  Result Date: 09/12/2021 CLINICAL DATA:  Chest pain EXAM: CHEST - 2 VIEW COMPARISON:  08/20/2021 FINDINGS: Generous heart size accentuated by low lung volumes. Indistinct perihilar opacity. No pleural fluid or pneumothorax. Right subclavian stenting. IMPRESSION: 1. Indistinct perihilar opacity which could be edema or atelectasis. 2. Chronic cardiomegaly. Electronically Signed   By: Jorje Guild M.D.   On: 09/12/2021 05:36    Pending Labs Unresulted Labs (From admission, onward)     Start     Ordered   09/13/21 0500  Heparin level (unfractionated)  Daily,   R     See Hyperspace for full Linked Orders Report.   09/12/21 0624   09/13/21 0500  CBC  Daily,   R     See Hyperspace for full Linked Orders Report.   09/12/21 0624   09/13/21 3825  Basic metabolic panel  Tomorrow morning,   R        09/12/21 1003   09/13/21 0500  Protime-INR  Tomorrow morning,   R        09/12/21 1003   09/12/21 1500  Heparin level (unfractionated)  Once-Timed,   STAT        09/12/21 0624   09/12/21 1002  Lipid panel  Once,   R        09/12/21 1003   09/12/21 1000  HIV Antibody (routine testing w rflx)  (HIV Antibody (Routine testing w reflex) panel)  Once,   R        09/12/21 1003   09/12/21 1000  Hemoglobin A1c  Once,  R        09/12/21 1003            Vitals/Pain Today's Vitals   09/12/21 0708 09/12/21 0715 09/12/21 1021 09/12/21 1021  BP: (!) 172/101 (!) 184/96 (!) 151/93   Pulse: 92 92 83   Resp: 20  20   Temp:      SpO2: 100% 100% 98%   Weight:      Height:      PainSc: 10-Worst pain ever   0-No pain    Isolation Precautions No active isolations  Medications Medications  nitroGLYCERIN 50 mg in dextrose 5 % 250 mL (0.2 mg/mL) infusion (20 mcg/min Intravenous Rate/Dose Change 09/12/21 0823)  heparin ADULT infusion 100 units/mL (25000 units/244mL) (1,100 Units/hr Intravenous  New Bag/Given 09/12/21 0707)  oxyCODONE (Oxy IR/ROXICODONE) immediate release tablet 5 mg (has no administration in time range)  amLODipine (NORVASC) tablet 10 mg (has no administration in time range)  hydrALAZINE (APRESOLINE) tablet 50 mg (has no administration in time range)  metoprolol succinate (TOPROL-XL) 24 hr tablet 100 mg (has no administration in time range)  traZODone (DESYREL) tablet 150 mg (has no administration in time range)  sevelamer carbonate (RENVELA) tablet 1,600 mg (has no administration in time range)  ticagrelor (BRILINTA) tablet 90 mg (has no administration in time range)  sodium zirconium cyclosilicate (LOKELMA) packet 10 g (has no administration in time range)  sodium chloride flush (NS) 0.9 % injection 3 mL (has no administration in time range)  sodium chloride flush (NS) 0.9 % injection 3 mL (has no administration in time range)  0.9 %  sodium chloride infusion (has no administration in time range)  acetaminophen (TYLENOL) tablet 650 mg (has no administration in time range)  ondansetron (ZOFRAN) injection 4 mg (has no administration in time range)  zolpidem (AMBIEN) tablet 5 mg (has no administration in time range)  sorbitol 70 % solution 30 mL (has no administration in time range)  docusate sodium (ENEMEEZ) enema 283 mg (has no administration in time range)  camphor-menthol (SARNA) lotion 1 application (has no administration in time range)    And  hydrOXYzine (ATARAX/VISTARIL) tablet 25 mg (has no administration in time range)  calcium carbonate (dosed in mg elemental calcium) suspension 500 mg of elemental calcium (has no administration in time range)  feeding supplement (NEPRO CARB STEADY) liquid 237 mL (has no administration in time range)  insulin aspart (novoLOG) injection 0-6 Units (has no administration in time range)  fentaNYL (SUBLIMAZE) injection 25 mcg (has no administration in time range)  aspirin EC tablet 81 mg (has no administration in time range)   LORazepam (ATIVAN) injection 0.5 mg (0.5 mg Intravenous Given 09/12/21 0618)  morphine 4 MG/ML injection 4 mg (4 mg Intravenous Given 09/12/21 0618)  diphenhydrAMINE (BENADRYL) injection 25 mg (25 mg Intravenous Given 09/12/21 0618)  heparin bolus via infusion 3,000 Units (3,000 Units Intravenous Bolus from Bag 09/12/21 0708)  HYDROmorphone (DILAUDID) injection 1 mg (1 mg Intravenous Given 09/12/21 0914)    Mobility walks Low fall risk   Focused Assessments Cardiac Assessment Handoff:  Cardiac Rhythm: Normal sinus rhythm Lab Results  Component Value Date   CKTOTAL 45 06/14/2011   CKMB 2.7 06/14/2011   TROPONINI <0.03 05/05/2015   Lab Results  Component Value Date   DDIMER 0.61 (H) 08/05/2020   Does the Patient currently have chest pain? No    R Recommendations: See Admitting Provider Note  Report given to:   Additional Notes: 1 IV, Heparin 11 mL/hr  and NTG currently at 20 mcg/min. Denies CP. Seizure precautions.

## 2021-09-12 NOTE — Consult Note (Addendum)
Cardiology Consultation:   Patient ID: [name] MRN: [id number]; DOB: 08/21/1966  Admit date: 09/12/2021 Date of Consult: 09/12/2021  PCP:  [name], MD   [facility] Providers Cardiologist:  Candee Furbish, MD (last evaluated patient in 2020); Most recently followed by [facility] - Dr. Tobe Sos  Patient Profile:   [name] is a 55 y.o. male with a past medical history of CAD (s/p cath in 09/2020 showing severe 3-vessel CAD involving mainly branch and distal vessels with DESx1 to the OM2 and unsuccessful PCI of the D2 due to the vessel being small and tortuous with medical management recommended, DES to mid-LAD and DES to D2 in 09/2020 at [facility] with angioplasty of rPDA, NSTEMI in 03/2021 with cath showing 90% distal LAD stent ISR and treated with overlapping DES), HFpEF, HTN, HLD, Type 2 DM and ESRD who is being seen 09/12/2021 for the evaluation of chest pain at the request of Dr. Roxanne Mins.  History of Present Illness:   [name] was most recently admitted to [facility] on 08/21/2021 for evaluation of palpitations and recent chest pain which had mixed features as it occurred after food consumption but had been relieved with NTG. Hs Troponin values were flat at 37, 23 and 36. He left AMA and went to [facility] the same day. Echo showed a preserved EF > 55% with no significant abnormalities. Underwent repeat cath on 08/23/2021 which showed mild diffuse disease along the proximal-LAD with 30-40% ISR along the LAD stent and apical LAD was subtotally occluded. Small D1 was occluded as well and the stent along his D2 had 80% ISR. Inferior part of D2 filled with left to left collaterals. Also had 90% RI stenosis and 90% OM1 stenosis. Stent along PL was patent. There were no good targets for PCI and medical management was recommended and LVEDP was severely elevated with volume management per HD recommended. He did have severe itching of his legs following the cath which was treated with Benadryl and steroids.  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  [facility] for evaluation of chest pain.  He reports that his chest pain was initially controlled after his hospitalization earlier this month but he developed chest pain over the past few days and it has been responsive to nitroglycerin. His current pain started last night and has been persistent overnight and this morning. Says nitroglycerin provides temporary relief and he has been on nitroglycerin drip but is still having pain. He reports associated dyspnea but denies any nausea, vomiting or diaphoresis. Reports occasional palpitations. Says that he has only slept for 2 to 3 hours over the past several days.  Initial labs show WBC 7.5, Hgb 10.8, platelets 356, Na+ 136, K+ 4.5 and creatinine 6.75. Initial Hs Troponin 29 with repeat pending. CXR showing indistinct perihilar opacity which could be edema or atelectasis. EKG shows NSR, HR 94 with LVH and ST depression along the lateral leads which is more prominent when compared to prior tracings.    Past Medical History:  Diagnosis Date   Acute osteomyelitis of right foot (HCC) 0[place]6/2020   Anemia 06/23/75/28/3151plication of anesthesia    Coronary artery disease  Diabetes with renal manifestations(250.4) 08/23/2013   Diabetic infection of right foot ([place])    Diabetic retinopathy associated with type 2 diabetes mellitus ([place]) 06/23/2019   ESRD on hemodialysis ([place]) 07/18/2011   Essential hypertension 10/18/2007   Qualifier: Diagnosis of  By: Loanne Drilling MD, Hilliard Clark A    History of blood transfusion    HYPERLIPIDEMIA 05/16/2008   Qualifier: Diagnosis of  By: Jenny Reichmann MD, Hunt Oris    MRSA INFECTION  10/18/2007   Qualifier: Diagnosis of  By: Marca Ancona RMA, Lucy     Other forms of retinal detachment(361.89) 06/23/2019   PONV (postoperative nausea and vomiting)    Secondary hyperparathyroidism ([place]) 06/23/2019   Sepsis ([place]) 05/14/2019    Past Surgical History:  Procedure Laterality Date   ABDOMINAL AORTOGRAM N/A 05/17/2019   Procedure: ABDOMINAL AORTOGRAM;  Surgeon: Elam Dutch, MD;  Location: [place] CV LAB;  Service: Cardiovascular;  Laterality: N/A;   AMPUTATION TOE Right 05/14/2019   Procedure: PARTIAL AMPUTATION SECOND TOE RIGHT FOOT;  Surgeon: Evelina Bucy, DPM;  Location: [place];  Service: Podiatry;  Laterality: Right;   AMPUTATION TOE Right 05/23/2019   Procedure: AMPUTATION OF SECOND TOE METATARSAL PHALANGEAL JOINT;  Surgeon: Evelina Bucy, DPM;  Location: [place];  Service: Podiatry;  Laterality: Right;   AMPUTATION TOE Right 05/27/2019   Procedure: Amputation Toe;  Surgeon: Evelina Bucy, DPM;  Location: [place];  Service: Podiatry;  Laterality: Right;  right third toe   APPLICATION OF WOUND VAC Right 05/27/2019   Procedure: Application Of Wound Vac;  Surgeon: Evelina Bucy, DPM;  Location: [place];  Service: Podiatry;  Laterality: Right;   AV FISTULA PLACEMENT  06-18-11   Right brachiocephalic AVF   BONE BIOPSY Right 05/14/2019   Procedure: OPEN SUPERFICIAL BONE BIOPSY GREAT TOE;  Surgeon: Evelina Bucy, DPM;  Location: [place];  Service: Podiatry;  Laterality: Right;   CARDIAC CATHETERIZATION     CORONARY BALLOON ANGIOPLASTY N/A 09/24/2020   Procedure: CORONARY BALLOON ANGIOPLASTY;  Surgeon: Martinique, Peter M, MD;  Location: [place] CV [facility];  Service: Cardiovascular;  Laterality: N/A;   CORONARY STENT INTERVENTION N/A 09/24/2020   Procedure: CORONARY STENT INTERVENTION;  Surgeon: Martinique, Peter M, MD;  Location: [place] CV LAB;  Service: Cardiovascular;  Laterality: N/A;   EYE SURGERY     left eye for Laser, diabetic retinopathy   FISTULOGRAM Right 11/06/2011    Procedure: FISTULOGRAM;  Surgeon: Conrad Menard, MD;  Location: [facility] CATH LAB;  Service: Cardiovascular;  Laterality: Right;   HEMATOMA EVACUATION Right Feb. 25, 2014   HEMATOMA EVACUATION Right 12/14/2012   Procedure: EVACUATION HEMATOMA;  Surgeon: Conrad Apollo, MD;  Location: [place];  Service: Vascular;  Laterality: Right;   INCISION AND DRAINAGE Right 05/23/2019   Procedure: INCISION AND DRAINAGE OF RIGHT FOOT DEEP SPACE ABSCESS;  Surgeon: Evelina Bucy, DPM;  Location: [place];  Service: Podiatry;  Laterality: Right;   INSERTION OF DIALYSIS CATHETER  12/14/2012   Procedure: INSERTION OF DIALYSIS CATHETER;  Surgeon: Conrad San Elizario, MD;  Location: [place];  Service: Vascular;;   INSERTION OF DIALYSIS CATHETER Right 01/07/2021   Procedure: INSERTION OF DIALYSIS CATHETER;  Surgeon: Elam Dutch, MD;  Location: [facility] OR;  Service: Vascular;  Laterality: Right;   INSERTION OF DIALYSIS CATHETER Left 01/08/2021   Procedure: INSERTION OF LEFT INTERNAL JUGULAR DIALYSIS CATHETER, tunneled;  Surgeon: Angelia Mould, MD;  Location: [place];  Service: Vascular;  Laterality: Left;  INTRAVASCULAR PRESSURE WIRE/FFR STUDY N/A 09/24/2020   Procedure: INTRAVASCULAR PRESSURE WIRE/FFR STUDY;  Surgeon: Martinique, Peter M, MD;  Location: [place] CV LAB;  Service: Cardiovascular;  Laterality: N/A;   IRRIGATION AND DEBRIDEMENT FOOT Right 05/25/2019   Procedure: INCISION AND DRAINAGE FOOT;  Surgeon: Evelina Bucy, DPM;  Location: [place];  Service: Podiatry;  Laterality: Right;   IRRIGATION AND DEBRIDEMENT FOOT Right 05/27/2019   Procedure: IRRIGATION AND DEBRIDEMENT FOOT;  Surgeon: Evelina Bucy, DPM;  Location: [place];  Service: Podiatry;  Laterality: Right;   LEFT HEART CATH AND CORONARY ANGIOGRAPHY N/A 09/24/2020   Procedure: LEFT HEART CATH AND CORONARY ANGIOGRAPHY;  Surgeon: Martinique, Peter M, MD;  Location: [place] CV LAB;  Service: Cardiovascular;  Laterality: N/A;   LOWER EXTREMITY ANGIOGRAPHY Bilateral 05/17/2019    Procedure: LOWER EXTREMITY ANGIOGRAPHY;  Surgeon: Elam Dutch, MD;  Location: [place] CV LAB;  Service: Cardiovascular;  Laterality: Bilateral;   REMOVAL OF A DIALYSIS CATHETER Right 01/08/2021   Procedure: REMOVAL OF TEMPORARY DIALYSIS CATHETER, LEFT FEMORAL VEIN;  Surgeon: Angelia Mould, MD;  Location: [facility];  Service: Vascular;  Laterality: Right;   REVISON OF ARTERIOVENOUS FISTULA Right 12/14/2012   Procedure: REVISON OF upper arm ARTERIOVENOUS FISTULA using 16mx10cm gortex graft;  Surgeon: BConrad [place] MD;  [place]  Service: Vascular;  Laterality: Right;   REVISON OF ARTERIOVENOUS FISTULA Right 12/27/2020   Procedure: RIGHT UPPER EXTREMITY ARTERIOVENOUS FISTULA REVISiON;  Surgeon: HCherre Robins MD;  Location: [facility] OR;  Service: Vascular;  Laterality: Right;  PERIPHERAL NERVE BLOCK   RIGHT/LEFT HEART CATH AND CORONARY ANGIOGRAPHY N/A 08/07/2020   Procedure: RIGHT/LEFT HEART CATH AND CORONARY ANGIOGRAPHY;  Surgeon: JMartinique Peter M, MD;  [place] [facility];  Service: Cardiovascular;  Laterality: N/A;   SOFT TISSUE MASS EXCISION     Right arm, Left leg  for MRSA infection     Home Medications:  Prior to Admission medications   Medication Sig Start Date End Date Taking? Authorizing Provider  amLODipine (NORVASC) 10 MG tablet Take 10 mg by mouth at bedtime.  08/03/20  Yes [provider]  aspirin EC 81 MG tablet Take 1 tablet (81 mg total) by mouth daily. 06/28/19  Yes AWellington Hampshire MD  Continuous Blood Gluc Sensor (FREESTYLE LIBRE 14 DAY SENSOR) MISC See admin instructions. 06/20/19  Yes [provider]  Doxercalciferol (HLavelleIV) Doxercalciferol (Hectorol) 07/31/21 07/30/22 Yes [provider]  ethyl chloride spray 1 application See admin instructions. As needed for pain on dialysis days 03/15/21  Yes [provider]  Evolocumab (REPATHA SURECLICK) 1245MG/ML SOAJ Inject 140 mg into the skin every 14 (fourteen)  days. 03/26/21  Yes [provider]  hydrALAZINE (APRESOLINE) 50 MG tablet Take 50 mg by mouth 3 (three) times daily. 12/18/20  Yes [provider]  isosorbide dinitrate (ISORDIL) 20 MG tablet Take 20 mg by mouth 3 (three) times daily. 08/24/20  Yes [provider]  Methoxy PEG-Epoetin Beta (MIRCERA IJ) dialysis 05/06/21 05/05/22 Yes [provider]  metoprolol succinate (TOPROL-XL) 100 MG 24 hr tablet Take 100 mg by mouth at bedtime. 08/06/21  Yes [provider]  nitroGLYCERIN (NITROSTAT) 0.4 MG SL tablet Place 0.4 mg under the tongue every 5 (five) minutes as needed for chest pain. 08/24/21 08/24/22 Yes [provider]  sevelamer carbonate (RENVELA) 800 MG tablet Take 1,600 mg by mouth 3 (three) times daily with meals. 07/25/20  Yes [provider]  sodium zirconium cyclosilicate (LOKELMA) 10 g PACK packet Take 10 g by mouth See admin instructions. Non dialysis days(Tuesday,Thursday,Saturday and "Sunday)   Yes [provider]  ticagrelor (BRILINTA) 90 MG TABS tablet TAKE 1 TABLET (90 MG TOTAL) BY MOUTH TWO TIMES DAILY. Patient taking differently: Take 90 mg by mouth 2 (two) times daily. 09/26/20 09/26/21 Yes Hongalgi, Anand D, MD  traZODone (DESYREL) 150 MG tablet Take 150 mg by mouth at bedtime. 07/15/21  Yes [provider]  blood glucose meter kit and supplies Dispense based on patient and insurance preference. Use up to four times daily as directed. (FOR ICD-10 E10.9, E11.9). Patient not taking: Reported on 09/12/2021 05/18/19   Mikhail, Maryann, DO  blood glucose meter kit and supplies Dispense based on patient and insurance preference. Use up to four times daily as directed. (FOR ICD-9 250.00, 250.01). Patient not taking: Reported on 09/12/2021 05/18/19   Mikhail, Maryann, DO  glucose blood (ACCU-CHEK AVIVA PLUS) test strip Use as instructed Patient not taking: Reported on 09/12/2021 05/18/19   Mikhail, Maryann, DO  Lancet  Devices (ACCU-CHEK SOFTCLIX) lancets Use as instructed Patient not taking: Reported on 09/12/2021 05/18/19   Mikhail, Maryann, DO  lidocaine-prilocaine (EMLA) cream Apply 1 application topically as needed. Patient not taking: Reported on 09/12/2021 01/02/21   Hawken, Thomas N, MD  oxyCODONE (OXY IR/ROXICODONE) 5 MG immediate release tablet Take 5 mg by mouth 4 (four) times daily as needed. 09/10/21   [provider]  propranolol (INDERAL) 80 MG tablet Take 1 tablet (80 mg total) by mouth 2 (two) times daily. Patient not taking: Reported on 06/21/2021 09/26/20   Hongalgi, Anand D, MD  loratadine (CLARITIN) 10 MG tablet Take 10 mg by mouth daily as needed for allergies.  11/06/19  [provider]    Inpatient Medications: Scheduled Meds:   Continuous Infusions:  heparin 1,100 Units/hr (09/12/21 0707)   nitroGLYCERIN 20 mcg/min (09/12/21 0823)   PRN Meds:   Allergies:    Allergies  Allergen Reactions   Atorvastatin     Other reaction(s): Muscle Pain   Rosuvastatin     Other reaction(s): Muscle Pain   Amoxicillin Itching    Did it involve swelling of the face/tongue/throat, SOB, or low BP? Unknown Did it involve sudden or severe rash/hives, skin peeling, or any reaction on the inside of your mouth or nose? Unknown Did you need to seek medical attention at a hospital or doctor's office? Unknown When did it last happen?      20"  years ago If all above answers are "NO", may proceed with cephalosporin use.    Chlorhexidine Hives    Patient has blistering   Hydromorphone Itching    Patient states he may take with benadryl. Makes feet itch.   Percocet [Oxycodone-Acetaminophen] Itching and Other (See Comments)    Legs only. Itching and burning feeling.   Morphine     Other reaction(s): rash/itching All narcotics per pt   Gabapentin Nausea And Vomiting    Stomach issues     Social History:   Social History   Socioeconomic History   Marital status: Married    Spouse  name: Not on file   Number of children: 2   Years of education: 14   Highest education level: Not on file  Occupational History   Occupation: Disabled  Tobacco Use   Smoking status: Never   Smokeless tobacco: Never  Vaping Use   Vaping Use: Never used  Substance and Sexual Activity   Alcohol  use: No   Drug use: No   Sexual activity: Yes  Other Topics Concern   Not on file  Social History Narrative   Not on file   Social Determinants of Health   Financial Resource Strain: Not on file  Food Insecurity: Not on file  Transportation Needs: Not on file  Physical Activity: Not on file  Stress: Not on file  Social Connections: Not on file  Intimate Partner Violence: Not on file    Family History:    Family History  Problem Relation Age of Onset   Diabetes Mother      ROS:  Please see the history of present illness.   All other ROS reviewed and negative.     Physical Exam/Data:   Vitals:   09/12/21 0615 09/12/21 0630 09/12/21 0708 09/12/21 0715  BP: (!) 170/85 (!) 170/93 (!) 172/101 (!) 184/96  Pulse: 93 90 92 92  Resp: (!) '22 18 20   ' Temp:      SpO2: 100% 100% 100% 100%  Weight:      Height:       No intake or output data in the 24 hours ending 09/12/21 0941 Last 3 Weights 09/12/2021 08/20/2021 06/10/2021  Weight (lbs) 194 lb 0.1 oz 185 lb 3 oz 187 lb 13.3 oz  Weight (kg) 88 kg 84 kg 85.2 kg     Body mass index is 27.84 kg/m.  General:  Well nourished, well developed male appearing in no acute distress.  HEENT: normal Neck: no JVD Vascular: No carotid bruits; Distal pulses 2+ bilaterally Cardiac:  normal S1, S2; RRR with occasional ectopic beats.  Lungs:  clear to auscultation bilaterally, no wheezing, rhonchi or rales  Abd: soft, nontender, no hepatomegaly  Ext: no pitting edema. Wound vac in place along left leg.  Musculoskeletal:  No deformities, BUE and BLE strength normal and equal Skin: warm and dry  Neuro:  CNs 2-12 intact, no focal abnormalities  noted Psych:  Normal affect   EKG:  The EKG was personally reviewed and demonstrates: NSR, HR 94 with LVH and ST depression along the lateral leads which is more prominent when compared to prior tracing.   Telemetry:  Telemetry was personally reviewed and demonstrates: NSR, HR in 80's to 90's. Frequent PVC's.   Relevant CV Studies:  Cardiac Catheterization: 09/2020 Mid LAD lesion is 60% stenosed. Dist LAD lesion is 80% stenosed. 1st Diag lesion is 90% stenosed. 2nd Diag lesion is 95% stenosed. Balloon angioplasty was performed using a BALLOON SAPPHIRE 2.5X12. Post intervention, there is a 80% residual stenosis. Lat 2nd Diag lesion is 100% stenosed. 1st Mrg lesion is 95% stenosed. 2nd Mrg lesion is 90% stenosed. A drug-eluting stent was successfully placed using a STENT RESOLUTE ONYX 2.5X12. Post intervention, there is a 0% residual stenosis. RPAV lesion is 80% stenosed. Ramus lesion is 90% stenosed. LV end diastolic pressure is moderately elevated.   1. Severe 3 vessel obstructive CAD. Involves primarily branch and distal vessels. Anatomy really unchanged from October. The culprit vessel is the second OM. 2. Moderately elevated LVEDP 27 mm Hg 3. Unsuccessful PCI of the second diagonal. This vessel is small and tortuous. Expanded with balloon with some improvement in flow but lesion unyielding.  4. Successful PCI of the second OM with DES x 1  Cardiac Catheter[facility]: 09/2020 (at [facility]) Lesion #1. 80% D2 (calcified)  XB3.5 guide with [facility] & 6Fr guideliner  2.61m pre-dilation  2.25x156mResolute DES  Post-dilated to high pressure with 2.2531m 2.39m100m  [place] balloon   Lesion #2. 70% midLAD  3.5x22m Resolute DES  Post-dilated with 3.5x119mNC balloon   Lesion #3. 80% rPDA  2.253mre-dilation  2.25x15m52msolute DES  Post-dilated with 2.25mm78mballoon @ high pressure   Excellent result   LFA arteriotomy closed successfully with 6Fr Perclose device after confirming that the L  iliac artery & proximal femoral artery are largely clear of disease.   [facility]:  Non-[facility], hypertension, end-stage renal disease on hemodialysis  [facility], diagonal  2, mid LAD with normal ejection fraction and commitment to  Brilinta.  He has a right-sided arm AV fistula, and some bulging  in his right groin, thus we access them via left common femoral  artery.   Findings/PostOp Diagnosis:  1.  90% distal LAD stent restenosis extending into the native LAD  status post PCI with overlapping 3.5 x 16 mm Synergy, drug-coated  stent dilated up to 3.8 mm with 0% residual and TIMI-3 flow.  2.  Normal left main artery with residual patent diagonal 2  stent, occluded inferior limb of diagonal 2 with collateral  filling, 30-40% mid circumflex stenosis, 50 to 60% distal right  coronary artery stenosis, and patent right posterolateral branch.  3.  LVEDP 23 with no AS-anticipating hemodialysis today.  4.  Left common femoral Angio-Seal  5.  Profound itching of his knees calves and feet during the  procedure requiring Benadryl, Solu-Medrol for resolution.    Consider pretreatment in the future if this is a response to  either sedation versus contrast.   Plan:  1.  Continued aspirin, Brilinta, and meds directed at blood  pressure and cholesterol  2.  Hemodialysis per renal today, and if clinically stable,  anticipate discharge tomorrow with clinical follow-up with his  cardiologist over the next 4 weeks upon return.  3.  Disposition tomorrow if stable.    Echocardiogram: 08/22/2021 (at [facility]) INTERPRETATION  NORMAL LV SYSTOLIC FUNCTION WITH MILD LVH  NO SIGNIFICANT VALVULAR DYSFUNCTION  NORMAL RV SIZE AND FUNCTION  NORMAL RIGHT ATRIAL PRESSURE  COMPARED TO PREVIOUS STUDY ON 08/02/20, MILD AORTIC VALVE STENOSIS NOW PRESENT   Cardiac [facility]20[facility]   1.  The  proximal LAD has diffuse mild disease.  Within the mid LAD there is a long segment of stent with modest 30 to 40% in-stent restenosis.  The apical LAD is subtotally occluded.  It is a very small vessel in the segment.  There is a very small first   diagonal branch which is also subtotally occluded.  A medium sized second diagonal branch has a stent in its ostial segment with approximately 80% in-stent restenosis.  There is an inferior branch of this diagonal branch which is occluded and fills via  left to left collaterals.  There is a small ramus branch which has diffuse 90% disease as well as a small OM1 branch of the left circumflex which has diffuse 90% disease.  Within the mid circumflex there is mild to moderate disease and severe disease in  the distal circumflex which is also a small vessel.  The RCA has nonobstructive disease with a widely patent stent in the PL branch.  2.  Severely elevated LVEDP  3.  Severe systemic hypertension, patient was given multiple doses of medications including labetalol and hydralazine to address the blood pressure  4.  Despite premedication for possible allergy, the patient developed severe pruritus of his lower  legs, treated with additional Benadryl and steroids.  No other signs of any systemic allergic reaction were present.  5.  Moderate sedation with IV Benadryl, fentanyl, and Versed for greater than 20 minutes was personally supervised by me with no sedation issues      Recommendations:   1.  Left femoral arterial sheath will be removed when blood pressure has improved  2.  No good targets for PCI identified.  Would recommend continued aggressive risk factor modification medical therapy  3.  Volume removal with hemodialysis in setting of severely elevated LVEDP  Laboratory Data:  High Sensitivity Troponin:   Recent Labs  Lab 08/20/21 1655 08/20/21 1919 09/12/21 0507 09/12/21 0749  TROPONINIHS 23* 36* 29* 46*     Chemistry Recent Labs  Lab  09/12/21 0507  NA 136  K 4.5  CL 92*  CO2 29  GLUCOSE 276*  BUN 33*  CREATININE 6.75*  CALCIUM 9.9  GFRNONAA 9*  ANIONGAP 15    No results for input(s): PROT, ALBUMIN, AST, ALT, ALKPHOS, BILITOT in the last 168 hours. Lipids No results for input(s): CHOL, TRIG, HDL, LABVLDL, LDLCALC, CHOLHDL in the last 168 hours.  Hematology Recent Labs  Lab 09/12/21 0507  WBC 7.5  RBC 3.82*  HGB 10.8*  HCT 34.2*  MCV 89.5  MCH 28.3  MCHC 31.6  RDW 17.8*  PLT 356   Thyroid No results for input(s): TSH, FREET4 in the last 168 hours.  BNPNo results for input(s): BNP, PROBNP in the last 168 hours.  DDimer No results for input(s): DDIMER in the last 168 hours.   Radiology/Studies:  DG Chest 2 View  Result Date: 09/12/2021 CLINICAL DATA:  Chest pain EXAM: CHEST - 2 VIEW COMPARISON:  08/20/2021 FINDINGS: Generous heart size accentuated by low lung volumes. Indistinct perihilar opacity. No pleural fluid or pneumothorax. Right subclavian stenting. IMPRESSION: 1. Indistinct perihilar opacity which could be edema or atelectasis. 2. Chronic cardiomegaly. Electronically Signed   By: Jorje Guild M.D.   On: 09/12/2021 05:36     Assessment and Plan:   1. Chest Pain concerning for Accelerating Angina - He has known diffuse CAD as outlined above with catheterization earlier this month showing diffuse small vessel disease as outlined above and 80% ISR along his D2 stent.  He reports progressive chest pain over the past several days which has been responsive to nitroglycerin but pain returns. Reports associated dyspnea and discomfort in his left arm. - Hs troponin values have been flat at 29 and 46 which is similar to prior values. His EKG does show ST depression along the lateral leads which is most consistent with LVH with repol but is more prominent when compared to prior tracings. - He has already been started on IV heparin and IV nitroglycerin. He just received Dilaudid and reviewed with his  nurse about titrating nitroglycerin further if pain persists and BP allows. Would continue PTA ASA 81 mg daily,  Toprol-XL 100 mg daily and Brilinta 90 mg twice daily. On Repatha given statin intolerance and hold Isordil while on NTG drip. If he continues to have persistent pain, he may require a re-look cardiac catheterization to see if there are any options for revascularization. Would require pre-medication for contrast allergy. Otherwise, will need to continue medical management for small vessel disease.   2. CAD - He underwent DESx1 to the OM2 and unsuccessful PCI of the D2 due to the vessel being small and tortuous in 09/2020, DES to mid-LAD and DES to D2  in 09/[facility] at [facility] with angioplasty of rPDA, NSTEMI in 03/2021 with cath showing 90% distal LAD stent ISR and treated with overlapping DES. Most recent cath earlier this month showed 30-40% ISR along the LAD stent and apical LAD was subtotally occluded. Small D1 was occluded as well and the stent along his D2 had 80% ISR. Inferior part of D2 filled with left to left collaterals. Also had 90% RI stenosis and 90% OM1 stenosis. Stent along PL was patent. There were no good targets for PCI and medical management was recommended. - May need to consider a re-look cath as outlined above.  - Continue ASA, Brilinta and Toprol-XL. Intolerant to statins and on Repatha.   3. HTN - SBP is currently int he 160's and will plan to titrate IV NTG as discussed above. Continue Toprol-XL, Amlodipine and Hydralazine. Hold Isordil given the use of IV NTG.   4. HLD - His LDL was at 35 in 03/2021. On Repatha as an outpatient given statin intolerances.   5. ESRD - On HD - MWF. Nephrology will need to be consulted by the admitting team.    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 Wound Vac currently in place. Will need the Wound Care nurse to see this admission.     Risk Assessment/Risk Scores:     TIMI Risk Score for Unstable Angina or Non-ST Elevation MI:   The patient's TIMI risk score is 6, which indicates a 41% risk of all cause mortality, new or recurrent myocardial infarction or need for urgent revascularization in the next 14 days.   For questions or updates, please contact [place] Please consult [email or web address] for contact info under    Signed, Erma Heritage, PA-C  09/12/2021 9:41 AM  Patient seen and examined with Bernerd Pho, PA-C.  Agree as above, with the following exceptions and changes as noted below.  55 year old male with complex coronary artery disease with recent cath at [facility], with no good targets for PCI and recommendation for medical management.  Volume is managed by dialysis.  Patient presents today with significant episode of chest pain and initially minimally elevated troponins.  His chest pain is improving, however now troponin is elevated to 4500.  Gen: NAD, CV: RRR, no murmurs, Lungs: clear, Abd: soft, Extrem: No significant edema, Neuro/Psych: alert and oriented x 3, normal mood and affect. All available labs, radiology testing, previous records reviewed.  Patient has a complex history of coronary artery disease as detailed above.  He may require not so straightforward coronary angiography and this should be reviewed by interventional cardiology tomorrow.  He has been started on IV heparin and IV nitroglycerin with good effect on his chest pain and for treatment of NSTEMI.  Unclear etiology of this recurrent ischemic event.  Suggest medical management given complexity of disease.  Patient is not keen to have another heart catheterization unless absolutely necessary, reasonable to review this prior to repeat cath.  Elouise Munroe, MD 09/12/21 6:59 PM

## 2021-09-12 NOTE — Progress Notes (Signed)
Notified of HST 4550. Pt is feeling better with improved chest pain. Given known CAD and improved symptoms, continue medical management.

## 2021-09-13 ENCOUNTER — Encounter (HOSPITAL_COMMUNITY)
Admission: EM | Disposition: A | Discharge status: Home / Self Care | Payer: Self-pay | Source: Home / Self Care | Attending: Family Medicine

## 2021-09-13 DIAGNOSIS — Z20822 Contact with and (suspected) exposure to covid-19: Secondary | ICD-10-CM | POA: Diagnosis present

## 2021-09-13 DIAGNOSIS — Z955 Presence of coronary angioplasty implant and graft: Secondary | ICD-10-CM | POA: Diagnosis not present

## 2021-09-13 DIAGNOSIS — N2581 Secondary hyperparathyroidism of renal origin: Secondary | ICD-10-CM | POA: Diagnosis present

## 2021-09-13 DIAGNOSIS — Z89412 Acquired absence of left great toe: Secondary | ICD-10-CM | POA: Diagnosis not present

## 2021-09-13 DIAGNOSIS — Z7902 Long term (current) use of antithrombotics/antiplatelets: Secondary | ICD-10-CM | POA: Diagnosis not present

## 2021-09-13 DIAGNOSIS — N186 End stage renal disease: Secondary | ICD-10-CM | POA: Diagnosis present

## 2021-09-13 DIAGNOSIS — Z992 Dependence on renal dialysis: Secondary | ICD-10-CM | POA: Diagnosis not present

## 2021-09-13 DIAGNOSIS — Z7982 Long term (current) use of aspirin: Secondary | ICD-10-CM | POA: Diagnosis not present

## 2021-09-13 DIAGNOSIS — E785 Hyperlipidemia, unspecified: Secondary | ICD-10-CM | POA: Diagnosis present

## 2021-09-13 DIAGNOSIS — D631 Anemia in chronic kidney disease: Secondary | ICD-10-CM | POA: Diagnosis present

## 2021-09-13 DIAGNOSIS — E1122 Type 2 diabetes mellitus with diabetic chronic kidney disease: Secondary | ICD-10-CM | POA: Diagnosis present

## 2021-09-13 DIAGNOSIS — Z888 Allergy status to other drugs, medicaments and biological substances status: Secondary | ICD-10-CM | POA: Diagnosis not present

## 2021-09-13 DIAGNOSIS — Z89422 Acquired absence of other left toe(s): Secondary | ICD-10-CM | POA: Diagnosis not present

## 2021-09-13 DIAGNOSIS — I25110 Atherosclerotic heart disease of native coronary artery with unstable angina pectoris: Secondary | ICD-10-CM | POA: Diagnosis present

## 2021-09-13 DIAGNOSIS — I214 Non-ST elevation (NSTEMI) myocardial infarction: Principal | ICD-10-CM

## 2021-09-13 DIAGNOSIS — Z880 Allergy status to penicillin: Secondary | ICD-10-CM | POA: Diagnosis not present

## 2021-09-13 DIAGNOSIS — Z885 Allergy status to narcotic agent status: Secondary | ICD-10-CM | POA: Diagnosis not present

## 2021-09-13 DIAGNOSIS — E1151 Type 2 diabetes mellitus with diabetic peripheral angiopathy without gangrene: Secondary | ICD-10-CM | POA: Diagnosis present

## 2021-09-13 DIAGNOSIS — Z79899 Other long term (current) drug therapy: Secondary | ICD-10-CM | POA: Diagnosis not present

## 2021-09-13 DIAGNOSIS — I249 Acute ischemic heart disease, unspecified: Secondary | ICD-10-CM | POA: Diagnosis present

## 2021-09-13 DIAGNOSIS — I120 Hypertensive chronic kidney disease with stage 5 chronic kidney disease or end stage renal disease: Secondary | ICD-10-CM | POA: Diagnosis present

## 2021-09-13 DIAGNOSIS — E875 Hyperkalemia: Secondary | ICD-10-CM | POA: Diagnosis present

## 2021-09-13 DIAGNOSIS — Z28310 Unvaccinated for covid-19: Secondary | ICD-10-CM | POA: Diagnosis not present

## 2021-09-13 DIAGNOSIS — L89890 Pressure ulcer of other site, unstageable: Secondary | ICD-10-CM | POA: Diagnosis present

## 2021-09-13 DIAGNOSIS — E1152 Type 2 diabetes mellitus with diabetic peripheral angiopathy with gangrene: Secondary | ICD-10-CM | POA: Diagnosis present

## 2021-09-13 LAB — GLUCOSE, CAPILLARY
Glucose-Capillary: 123 mg/dL — ABNORMAL HIGH (ref 70–99)
Glucose-Capillary: 164 mg/dL — ABNORMAL HIGH (ref 70–99)
Glucose-Capillary: 116 mg/dL — ABNORMAL HIGH (ref 70–99)
Glucose-Capillary: 186 mg/dL — ABNORMAL HIGH (ref 70–99)

## 2021-09-13 LAB — CBC
HCT: 31.9 % — ABNORMAL LOW (ref 39.0–52.0)
Hemoglobin: 10.3 g/dL — ABNORMAL LOW (ref 13.0–17.0)
MCH: 28.9 pg (ref 26.0–34.0)
MCHC: 32.3 g/dL (ref 30.0–36.0)
MCV: 89.4 fL (ref 80.0–100.0)
Platelets: 345 10*3/uL (ref 150–400)
RBC: 3.57 MIL/uL — ABNORMAL LOW (ref 4.22–5.81)
RDW: 17.6 % — ABNORMAL HIGH (ref 11.5–15.5)
WBC: 6.9 10*3/uL (ref 4.0–10.5)
nRBC: 0 % (ref 0.0–0.2)

## 2021-09-13 LAB — BASIC METABOLIC PANEL
Anion gap: 15 (ref 5–15)
BUN: 47 mg/dL — ABNORMAL HIGH (ref 6–20)
CO2: 28 mmol/L (ref 22–32)
Calcium: 9.2 mg/dL (ref 8.9–10.3)
Chloride: 92 mmol/L — ABNORMAL LOW (ref 98–111)
Creatinine, Ser: 9.27 mg/dL — ABNORMAL HIGH (ref 0.61–1.24)
GFR, Estimated: 6 mL/min — ABNORMAL LOW (ref >60)
Glucose, Bld: 116 mg/dL — ABNORMAL HIGH (ref 70–99)
Potassium: 5 mmol/L (ref 3.5–5.1)
Sodium: 135 mmol/L (ref 135–145)

## 2021-09-13 LAB — HEPARIN LEVEL (UNFRACTIONATED): Heparin Unfractionated: 0.58 IU/mL (ref 0.30–0.70)

## 2021-09-13 LAB — PROTIME-INR
INR: 1.1 (ref 0.8–1.2)
Prothrombin Time: 13.7 seconds (ref 11.4–15.2)

## 2021-09-13 SURGERY — LEFT HEART CATH AND CORONARY ANGIOGRAPHY
Anesthesia: LOCAL

## 2021-09-13 MED ORDER — LIDOCAINE HCL (PF) 1 % IJ SOLN: 5.0000 mL | INTRAMUSCULAR | Status: DC | PRN

## 2021-09-13 MED ORDER — LIDOCAINE-PRILOCAINE 2.5-2.5 % EX CREA
1.0000 "application " | TOPICAL_CREAM | CUTANEOUS | Status: DC | PRN
  Filled 2021-09-13: qty 5

## 2021-09-13 MED ORDER — FERRIC CITRATE 1 GM 210 MG(FE) PO TABS
420.0000 mg | ORAL_TABLET | Freq: Three times a day (TID) | ORAL | Status: DC
  Administered 2021-09-13 – 2021-09-15 (×6): 420 mg via ORAL
  Filled 2021-09-13 (×6): qty 2

## 2021-09-13 MED ORDER — DARBEPOETIN ALFA 40 MCG/0.4ML IJ SOSY
40.0000 ug | PREFILLED_SYRINGE | Freq: Once | INTRAMUSCULAR | Status: AC
  Administered 2021-09-14: 40 ug via INTRAVENOUS
  Filled 2021-09-13 (×2): qty 0.4

## 2021-09-13 MED ORDER — SODIUM CHLORIDE 0.9 % IV SOLN: INTRAVENOUS | Status: DC

## 2021-09-13 MED ORDER — SODIUM ZIRCONIUM CYCLOSILICATE 10 G PO PACK
10.0000 g | PACK | Freq: Once | ORAL | Status: AC
  Administered 2021-09-13: 10 g via ORAL
  Filled 2021-09-13: qty 1

## 2021-09-13 MED ORDER — SODIUM CHLORIDE 0.9 % IV SOLN: 2.0000 g | INTRAVENOUS | Status: DC

## 2021-09-13 MED ORDER — VANCOMYCIN HCL IN DEXTROSE 1-5 GM/200ML-% IV SOLN: 1000.0000 mg | INTRAVENOUS | Status: DC

## 2021-09-13 MED ORDER — SODIUM CHLORIDE 0.9 % IV SOLN: 100.0000 mL | INTRAVENOUS | Status: DC | PRN

## 2021-09-13 MED ORDER — DOXERCALCIFEROL 4 MCG/2ML IV SOLN: 5.0000 ug | INTRAVENOUS | Status: DC

## 2021-09-13 MED ORDER — PENTAFLUOROPROP-TETRAFLUOROETH EX AERO: 1.0000 "application " | INHALATION_SPRAY | CUTANEOUS | Status: DC | PRN

## 2021-09-13 MED ORDER — VANCOMYCIN HCL IN DEXTROSE 1-5 GM/200ML-% IV SOLN: 1000.0000 mg | Freq: Once | INTRAVENOUS | Status: DC

## 2021-09-13 MED ORDER — VANCOMYCIN HCL IN DEXTROSE 1-5 GM/200ML-% IV SOLN
1000.0000 mg | INTRAVENOUS | Status: DC
  Filled 2021-09-13: qty 200

## 2021-09-13 MED ORDER — SODIUM CHLORIDE 0.9 % IV SOLN: 2.0000 g | Freq: Once | INTRAVENOUS | Status: DC

## 2021-09-13 NOTE — Progress Notes (Signed)
Progress Note  Patient Name: [name] Date of Encounter: 09/13/2021  [facility] Cardiologist: Candee Furbish, MD   Subjective   No chest pain this am. No dyspnea.   Inpatient Medications    Scheduled Meds:  amLODipine  10 mg Oral QHS   aspirin EC  81 mg Oral Daily   hydrALAZINE  50 mg Oral TID   insulin aspart  0-6 Units Subcutaneous TID WC   metoprolol succinate  100 mg Oral QHS   metroNIDAZOLE  500 mg Oral Q12H   sevelamer carbonate  1,600 mg Oral TID WC   sodium chloride flush  3 mL Intravenous Q12H   [START ON 09/14/2021] sodium zirconium cyclosilicate  10 g Oral Once per day on Sun Tue Thu Sat   ticagrelor  90 mg Oral BID   traZODone  150 mg Oral QHS   Continuous Infusions:  sodium chloride     sodium chloride     heparin 1,500 Units/hr (09/13/21 0033)   nitroGLYCERIN 20 mcg/min (09/12/21 0823)   PRN Meds: sodium chloride, acetaminophen, calcium carbonate (dosed in mg elemental calcium), camphor-menthol **AND** hydrOXYzine, docusate sodium, feeding supplement (NEPRO CARB STEADY), fentaNYL (SUBLIMAZE) injection, oxyCODONE, sodium chloride flush, sorbitol, zolpidem   Vital Signs    Vitals:   09/12/21 2138 09/13/21 0000 09/13/21 0322 09/13/21 0400  BP: 137/80 102/63  107/65  Pulse:      Resp:  15  16  Temp:    98.4 F (36.9 C)  TempSrc:    Oral  SpO2:  91%  96%  Weight:   84.3 kg   Height:        Intake/Output Summary (Last 24 hours) at 09/13/2021 0905 Last data filed at 09/13/2021 0100 Gross per 24 hour  Intake 487.75 ml  Output --  Net 487.75 ml   Last 3 Weights 09/13/2021 09/12/2021 09/12/2021  Weight (lbs) 185 lb 13.6 oz 184 lb 15.5 oz 194 lb 0.1 oz  Weight (kg) 84.3 kg 83.9 kg 88 kg      Telemetry    Sinus - Personally Reviewed  ECG    No AM EKG - Personally Reviewed  Physical Exam   GEN: No acute distress.   Neck: No JVD Cardiac: RRR, no murmurs, rubs, or gallops.  Respiratory: Clear to auscultation bilaterally. GI: Soft,  nontender, non-distended  MS: No edema; No deformity. Neuro:  Nonfocal  Psych: Normal affect   Labs    High Sensitivity Troponin:   Recent Labs  Lab 08/20/21 1919 09/12/21 0507 09/12/21 0749 09/12/21 1458 09/12/21 1708  TROPONINIHS 36* 29* 46* 4,550* 5,495*     Chemistry Recent Labs  Lab 09/12/21 0507  NA 136  K 4.5  CL 92*  CO2 29  GLUCOSE 276*  BUN 33*  CREATININE 6.75*  CALCIUM 9.9  GFRNONAA 9*  ANIONGAP 15    Lipids  Recent Labs  Lab 09/12/21 0454  CHOL 144  TRIG 352*  HDL 24*  LDLCALC 50  CHOLHDL 6.0    Hematology Recent Labs  Lab 09/12/21 0507  WBC 7.5  RBC 3.82*  HGB 10.8*  HCT 34.2*  MCV 89.5  MCH 28.3  MCHC 31.6  RDW 17.8*  PLT 356   Thyroid No results for input(s): TSH, FREET4 in the last 168 hours.  BNPNo results for input(s): BNP, PROBNP in the last 168 hours.  DDimer No results for input(s): DDIMER in the last 168 hours.   Radiology    DG Chest 2 View  Result Date:  09/12/2021 CLINICAL DATA:  Chest pain EXAM: CHEST - 2 VIEW COMPARISON:  08/20/2021 FINDINGS: Generous heart size accentuated by low lung volumes. Indistinct perihilar opacity. No pleural fluid or pneumothorax. Right subclavian stenting. IMPRESSION: 1. Indistinct perihilar opacity which could be edema or atelectasis. 2. Chronic cardiomegaly. Electronically Signed   By: Jorje Guild M.D.   On: 09/12/2021 05:36    Cardiac Studies   See above  Patient Profile     55 y.o. male with a past medical history of CAD (s/p cath in 09/2020 showing severe 3-vessel CAD involving mainly branch and distal vessels with DESx1 to the OM2 and unsuccessful PCI of the D2 due to the vessel being small and tortuous with medical management recommended, DES to mid-LAD and DES to D2 in 09/2020 at [facility] with angioplasty of rPDA, NSTEMI in 03/2021 with cath showing 90% distal LAD stent ISR and treated with overlapping DES), HFpEF, HTN, HLD, Type 2 DM and ESRD who was seen by cardiology 09/12/2021  for the evaluation of chest pain   Assessment & Plan    CAD with unstable angina/NSTEMI: Pt with known extensive CAD with multiple prior PCI procedures. Most recent cardiac cath three weeks ago at [facility]. This showed multi-vessel CAD with diffuse branch vessel disease. No PCI performed as there were no good targets for PCI. He is followed at [facility] in cardiology there. Now admitted with chest pain. Hs Troponin up to 5495. He has no chest pain this am. I have spent 45 minutes speaking to him, his daughter and wife and his [facility] cardiologist Dr. Tobe Sos this am. We had planned on a cardiac cath today but after speaking to his cardiologist, we have elected to continue medical therapy for his CAD at this time. His last cath was complicated by embolization to his toes leading to gangrene.  -I  have reviewed his current presentation with the patient and his family. As it is unlikely that we will find any targets for PCI, we will not proceed with cardiac cath today. The patient is in agreement with plan.  I will continue IV heparin for a total of 48 hours.  Continue DAPT with ASA and Brilinta Continue statin and beta blocker.  He can proceed with HD today.   I would anticipate discharge on Sunday if he is stable.   For questions or updates, please contact [place] Please consult [email or web address] for contact info under        Signed, Lauree Chandler, MD  09/13/2021, 9:05 AM

## 2021-09-13 NOTE — Progress Notes (Signed)
[facility] for heparin Indication: chest pain/ACS  Allergies  Allergen Reactions   Atorvastatin     Other reaction(s): Muscle Pain   Rosuvastatin     Other reaction(s): Muscle Pain   Amoxicillin Itching    Did it involve swelling of the face/tongue/throat, SOB, or low BP? Unknown Did it involve sudden or severe rash/hives, skin peeling, or any reaction on the inside of your mouth or nose? Unknown Did you need to seek medical attention at a hospital or doctor's office? Unknown When did it last happen?      20 years ago If all above answers are "NO", may proceed with cephalosporin use.    Chlorhexidine Hives    Patient has blistering   Hydromorphone Itching    Patient states he may take with benadryl. Makes feet itch.   Percocet [Oxycodone-Acetaminophen] Itching and Other (See Comments)    Legs only. Itching and burning feeling.   Morphine     Other reaction(s): rash/itching All narcotics per pt   Gabapentin Nausea And Vomiting    Stomach issues     Patient Measurements: Height: 5\' 10"  (177.8 cm) Weight: 84.3 kg (185 lb 13.6 oz) IBW/kg (Calculated) : 73 Heparin Dosing Weight: 84 kg  Vital Signs: Temp: 98 F (36.7 C) (11/25 0742) Temp Source: Oral (11/25 0742) BP: 115/74 (11/25 0742) Pulse Rate: 74 (11/25 0742)  Labs: Recent Labs    09/12/21 0507 09/12/21 0749 09/12/21 1448 09/12/21 1458 09/12/21 1708 09/12/21 2252 09/13/21 0848  HGB 10.8*  --   --   --   --   --  10.3*  HCT 34.2*  --   --   --   --   --  31.9*  PLT 356  --   --   --   --   --  345  LABPROT  --   --   --   --   --   --  13.7  INR  --   --   --   --   --   --  1.1  HEPARINUNFRC  --   --  0.22*  --   --  0.17* 0.58  CREATININE 6.75*  --   --   --   --   --  9.27*  TROPONINIHS 29* 46*  --  4,550* 5,495*  --   --      Estimated Creatinine Clearance: 9.3 mL/min (A) (by C-G formula based on SCr of 9.27 mg/dL (H)).   sodium chloride     sodium chloride      heparin 1,500 Units/hr (09/13/21 0033)   nitroGLYCERIN 20 mcg/min (09/12/21 3662)     Assessment: 55yo male with chest pain and mildly elevated troponin on admission. Not on anticoagulation prior to admission. Pharmacy consulted to dose heparin.   Heparin level at goal this AM.  No plans for cath lab.  Planning to continue IV heparin x 48 hrs total.  CBC stable, no overt bleeding or complications noted.  Goal of Therapy:  Heparin level 0.3-0.7 units/ml Monitor platelets by anticoagulation protocol: Yes   Plan:  Continue heparin at 1500 units/hr for total of 48 hrs (48 hrs would end 11/26 at ~7 AM). Daily heparin level and CBC.  [place] Crane, Roylene Reason, [facility] Clinical Pharmacist  09/13/2021 10:27 AM   [facility] pharmacy phone numbers are listed on [email or web address]

## 2021-09-13 NOTE — Consult Note (Addendum)
[facility] Renal Consultation Note    Indication for Consultation:  Management of ESRD/hemodialysis; anemia, hypertension/volume and secondary hyperparathyroidism PCP: Dr. Revonda Humphrey Nephrology: Dr. Hollie Salk  HPI: [name] is a 55 y.o. male with ESRD on hemodialysis since 2021 at [facility]. PMH: HTN, DMT2, CAD, PVD, MRSA, hyperkalemia on lokelma on non HD days,  Anemia of Chronic Kidney disease, SHPT. Last OP HD 09/11/2021. Small gain, left 0.1 kg under OP EDW. History of high IDWG, but otherwise generally compliant with HD. He presented to ED 09/12/2021 with chest pain, elevated troponin-peak 5495. Known CAD followed by Dr. Tobe Sos at [facility]. He was seen by Cardiology who opts to treat medically.  He has been admitted as observation patient for USA/NSTEMI.   Seen in room, still having 5/10 SSCP. Resumed NTG gtt at 20 mcg/min with relief of symptoms. SR on monitor. On heparin gtt. Denies SOB. No evidence of overt volume overload. Was supposed to have HD 09/14/2021 on Holiday Schedule. Due to scheduling/staffing issues, we will continue that plan for HD 09/14/2021. Discussed with patient, he is fine with this plan. He is alert , in no distress at the time of my eval-  no CP  Past Medical History:  Diagnosis Date   Acute osteomyelitis of right foot ([place]) 05/15/2019   Anemia 62/94/7654   Complication of anesthesia    Coronary artery disease    Diabetes with renal manifestations(250.4) 08/23/2013   Diabetic infection of right foot ([place])    Diabetic retinopathy associated with type 2 diabetes mellitus ([place]) 06/23/2019   ESRD on hemodialysis ([place]) 07/18/2011   Essential hypertension 10/18/2007   Qualifier: Diagnosis of  By: Loanne Drilling MD, Hilliard Clark A    History of blood transfusion    HYPERLIPIDEMIA 05/16/2008   Qualifier: Diagnosis of  By: Jenny Reichmann MD, Hunt Oris    MRSA INFECTION 10/18/2007   Qualifier: Diagnosis of  By: Marca Ancona RMA, Lucy     Other forms of retinal  detachment(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) 06/23/2019   PONV (postoperative nausea and vomiting)    Secondary hyperparathyroidism ([place]) 06/23/2019   Sepsis ([place]) 05/14/2019   Past Surgical History:  Procedure Laterality Date   ABDOMINAL AORTOGRAM N/A 05/17/2019   Procedure: ABDOMINAL AORTOGRAM;  Surgeon: Elam Dutch, MD;  Location: [place] [facility];  Service: Cardiovascular;  Laterality: N/A;   AMPUTATION TOE Right 05/14/2019   Procedure: PARTIAL AMPUTATION SECOND TOE RIGHT FOOT;  Surgeon: Evelina Bucy, DPM;  Location: [place];  Service: Podiatry;  Laterality: Right;   AMPUTATION TOE Right 05/23/2019   Procedure: AMPUTATION OF SECOND TOE METATARSAL PHALANGEAL JOINT;  Surgeon: Evelina Bucy, DPM;  Location: [place];  Service: Podiatry;  Laterality: Right;   AMPUTATION TOE Right 05/27/2019   Procedure: Amputation Toe;  Surgeon: Evelina Bucy, DPM;  Location: [place];  Service: Podiatry;  Laterality: Right;  right third toe   APPLICATION OF WOUND VAC Right 05/27/2019   Procedure: Application Of Wound Vac;  Surgeon: Evelina Bucy, DPM;  Location: [place];  Service: Podiatry;  Laterality: Right;   AV FISTULA PLACEMENT  06-18-11   Right brachiocephalic AVF   BONE BIOPSY Right 05/14/2019   Procedure: OPEN SUPERFICIAL BONE BIOPSY GREAT TOE;  Surgeon: Evelina Bucy, DPM;  Location: [place];  Service: Podiatry;  Laterality: Right;   CARDIAC CATHETERIZATION     CORONARY BALLOON ANGIOPLASTY N/A 09/24/2020   Procedure: CORONARY BALLOON ANGIOPLASTY;  Surgeon: Martinique, Peter M, MD;  Location: [place] [facility];  Service:  Cardiovascular;  Laterality: N/A;   CORONARY STENT INTERVENTION N/A 09/24/2020   Procedure: CORONARY STENT INTERVENTION;  Surgeon: Martinique, Peter M, MD;  Location: [place] [facility];  Service: Cardiovascular;  Laterality: N/A;   EYE SURGERY     left eye for Laser, diabetic retinopathy   FISTULOGRAM Right 11/06/2011   Procedure: FISTULOGRAM;  Surgeon: Conrad Hancock, MD;  Location: [facility] CATH LAB;  Service:  Cardiovascular;  Laterality: Right;   HEMATOMA EVACUATION Right Feb. 25, 2014   HEMATOMA EVACUATION Right 12/14/2012   Procedure: EVACUATION HEMATOMA;  Surgeon: Conrad Cuero, MD;  Location: [place];  Service: Vascular;  Laterality: Right;   INCISION AND DRAINAGE Right 05/23/2019   Procedure: INCISION AND DRAINAGE OF RIGHT FOOT DEEP SPACE ABSCESS;  Surgeon: Evelina Bucy, DPM;  Location: [place];  Service: Podiatry;  Laterality: Right;   INSERTION OF DIALYSIS CATHETER  12/14/2012   Procedure: INSERTION OF DIALYSIS CATHETER;  Surgeon: Conrad [place] Lake, MD;  Location: [place];  Service: Vascular;;   INSERTION OF DIALYSIS CATHETER Right 01/07/2021   Procedure: INSERTION OF DIALYSIS CATHETER;  Surgeon: Elam Dutch, MD;  Location: [place];  Service: Vascular;  Laterality: Right;   INSERTION OF DIALYSIS CATHETER Left 01/08/2021   Procedure: INSERTION OF LEFT INTERNAL JUGULAR DIALYSIS CATHETER, tunneled;  Surgeon: Angelia Mould, MD;  Location: [place];  Service: Vascular;  Laterality: Left;   INTRAVASCULAR PRESSURE WIRE/FFR STUDY N/A 09/24/2020   Procedure: INTRAVASCULAR PRESSURE WIRE/FFR STUDY;  Surgeon: Martinique, Peter M, MD;  Location: [place] [facility];  Service: Cardiovascular;  Laterality: N/A;   IRRIGATION AND DEBRIDEMENT FOOT Right 05/25/2019   Procedure: INCISION AND DRAINAGE FOOT;  Surgeon: Evelina Bucy, DPM;  Location: [place];  Service: Podiatry;  Laterality: Right;   IRRIGATION AND DEBRIDEMENT FOOT Right 05/27/2019   Procedure: IRRIGATION AND DEBRIDEMENT FOOT;  Surgeon: Evelina Bucy, DPM;  Location: [place];  Service: Podiatry;  Laterality: Right;   LEFT HEART CATH AND CORONARY ANGIOGRAPHY N/A 09/24/2020   Procedure: LEFT HEART CATH AND CORONARY ANGIOGRAPHY;  Surgeon: Martinique, Peter M, MD;  Location: [place] [facility];  Service: Cardiovascular;  Laterality: N/A;   LOWER EXTREMITY ANGIOGRAPHY Bilateral 05/17/2019   Procedure: LOWER EXTREMITY ANGIOGRAPHY;  Surgeon: Elam Dutch, MD;  Location:  [place] [facility];  Service: Cardiovascular;  Laterality: Bilateral;   REMOVAL OF A DIALYSIS CATHETER Right 01/08/2021   Procedure: REMOVAL OF TEMPORARY DIALYSIS CATHETER, LEFT FEMORAL VEIN;  Surgeon: Angelia Mould, MD;  Location: [facility] OR;  Service: Vascular;  Laterality: Right;   REVISON OF ARTERIOVENOUS FISTULA Right 12/14/2012   Procedure: REVISON OF upper arm ARTERIOVENOUS FISTULA using 80mmx10cm gortex graft;  Surgeon: Conrad [place], MD;  Location: [place];  Service: Vascular;  Laterality: Right;   REVISON OF ARTERIOVENOUS FISTULA Right 12/27/2020   Procedure: RIGHT UPPER EXTREMITY ARTERIOVENOUS FISTULA REVISiON;  Surgeon: Cherre Robins, MD;  Location: [facility] OR;  Service: Vascular;  Laterality: Right;  PERIPHERAL NERVE BLOCK   RIGHT/LEFT HEART CATH AND CORONARY ANGIOGRAPHY N/A 08/07/2020   Procedure: RIGHT/LEFT HEART CATH AND CORONARY ANGIOGRAPHY;  Surgeon: Martinique, Peter M, MD;  Location: [place] [facility];  Service: Cardiovascular;  Laterality: N/A;   SOFT TISSUE MASS EXCISION     Right arm, Left leg  for MRSA infection   Family History  Problem Relation Age of Onset   Diabetes Mother    Social History:  reports that he has never smoked. He has never used smokeless  tobacco. He reports that he does not drink alcohol and does not use drugs. Allergies  Allergen Reactions   Atorvastatin     Other reaction(s): Muscle Pain   Rosuvastatin     Other reaction(s): Muscle Pain   Amoxicillin Itching    Did it involve swelling of the face/tongue/throat, SOB, or low BP? Unknown Did it involve sudden or severe rash/hives, skin peeling, or any reaction on the inside of your mouth or nose? Unknown Did you need to seek medical attention at a hospital or doctor's office? Unknown When did it last happen?      20 years ago If all above answers are "NO", may proceed with cephalosporin use.    Chlorhexidine Hives    Patient has blistering   Hydromorphone Itching    Patient states he may take with  benadryl. Makes feet itch.   Percocet [Oxycodone-Acetaminophen] Itching and Other (See Comments)    Legs only. Itching and burning feeling.   Morphine     Other reaction(s): rash/itching All narcotics per pt   Gabapentin Nausea And Vomiting    Stomach issues    Prior to Admission medications   Medication Sig Start Date End Date Taking? Authorizing Provider  amLODipine (NORVASC) 10 MG tablet Take 10 mg by mouth at bedtime.  08/03/20  Yes [provider]  aspirin EC 81 MG tablet Take 1 tablet (81 mg total) by mouth daily. 06/28/19  Yes Wellington Hampshire, MD  Continuous Blood Gluc Sensor (FREESTYLE LIBRE 14 DAY SENSOR) MISC See admin instructions. 06/20/19  Yes [provider]  Doxercalciferol ([place] IV) Doxercalciferol (Hectorol) 07/31/21 07/30/22 Yes [provider]  ethyl chloride spray 1 application See admin instructions. As needed for pain on dialysis days 03/15/21  Yes [provider]  Evolocumab (REPATHA SURECLICK) 578 MG/ML SOAJ Inject 140 mg into the skin every 14 (fourteen) days. 03/26/21  Yes [provider]  hydrALAZINE (APRESOLINE) 50 MG tablet Take 50 mg by mouth 3 (three) times daily. 12/18/20  Yes [provider]  isosorbide dinitrate (ISORDIL) 20 MG tablet Take 20 mg by mouth 3 (three) times daily. 08/24/20  Yes [provider]  Methoxy PEG-Epoetin Beta (MIRCERA IJ) dialysis 05/06/21 05/05/22 Yes [provider]  metoprolol succinate (TOPROL-XL) 100 MG 24 hr tablet Take 100 mg by mouth at bedtime. 08/06/21  Yes [provider]  nitroGLYCERIN (NITROSTAT) 0.4 MG SL tablet Place 0.4 mg under the tongue every 5 (five) minutes as needed for chest pain. 08/24/21 08/24/22 Yes [provider]  sevelamer carbonate (RENVELA) 800 MG tablet Take 1,600 mg by mouth 3 (three) times daily with meals. 07/25/20  Yes [provider]  sodium zirconium cyclosilicate (LOKELMA) 10 g PACK packet Take 10 g by mouth  See admin instructions. Non dialysis days(Tuesday,Thursday,Saturday and Sunday)   Yes [provider]  ticagrelor (BRILINTA) 90 MG TABS tablet TAKE 1 TABLET (90 MG TOTAL) BY MOUTH TWO TIMES DAILY. Patient taking differently: Take 90 mg by mouth 2 (two) times daily. 09/26/20 09/26/21 Yes Hongalgi, Lenis Dickinson, MD  traZODone (DESYREL) 150 MG tablet Take 150 mg by mouth at bedtime. 07/15/21  Yes [provider]  oxyCODONE (OXY IR/ROXICODONE) 5 MG immediate release tablet Take 5 mg by mouth 4 (four) times daily as needed. 09/10/21   [provider]  loratadine (CLARITIN) 10 MG tablet Take 10 mg by mouth daily as needed for allergies.  11/06/19  [provider]   Current Facility-Administered Medications  Medication Dose Route Frequency  Provider Last Rate Last Admin   0.9 %  sodium chloride infusion  250 mL Intravenous PRN Karmen Bongo, MD       0.9 %  sodium chloride infusion   Intravenous Continuous Sherren Mocha, MD       acetaminophen (TYLENOL) tablet 650 mg  650 mg Oral Q4H PRN Karmen Bongo, MD       amLODipine (NORVASC) tablet 10 mg  10 mg Oral Ivery Quale, MD   10 mg at 09/12/21 2138   aspirin EC tablet 81 mg  81 mg Oral Daily Karmen Bongo, MD   81 mg at 09/13/21 1041   calcium carbonate (dosed in mg elemental calcium) suspension 500 mg of elemental calcium  500 mg of elemental calcium Oral Q6H PRN Karmen Bongo, MD       camphor-menthol [facility]) lotion 1 application  1 application Topical E9H PRN Karmen Bongo, MD       And   hydrOXYzine (ATARAX/VISTARIL) tablet 25 mg  25 mg Oral Q8H PRN Karmen Bongo, MD   25 mg at 09/12/21 1605   ceFEPIme (MAXIPIME) 2 g in sodium chloride 0.9 % 100 mL IVPB  2 g Intravenous Q M,W,F-HD Carney, Gay Filler, RPH       docusate sodium (ENEMEEZ) enema 283 mg  1 enema Rectal PRN Karmen Bongo, MD       feeding supplement (NEPRO CARB STEADY) liquid 237 mL  237 mL Oral TID PRN Karmen Bongo, MD       fentaNYL  (SUBLIMAZE) injection 25 mcg  25 mcg Intravenous Q2H PRN Karmen Bongo, MD       heparin ADULT infusion 100 units/mL (25000 units/265mL)  1,500 Units/hr Intravenous Continuous Franky Macho, RPH 15 mL/hr at 09/13/21 0033 1,500 Units/hr at 09/13/21 0033   hydrALAZINE (APRESOLINE) tablet 50 mg  50 mg Oral TID Karmen Bongo, MD   50 mg at 09/13/21 1041   insulin aspart (novoLOG) injection 0-6 Units  0-6 Units Subcutaneous TID WC Karmen Bongo, MD   2 Units at 09/12/21 1310   metoprolol succinate (TOPROL-XL) 24 hr tablet 100 mg  100 mg Oral Ivery Quale, MD   100 mg at 09/12/21 2138   metroNIDAZOLE (FLAGYL) tablet 500 mg  500 mg Oral Q12H Chotiner, Yevonne Aline, MD   500 mg at 09/13/21 1042   nitroGLYCERIN 50 mg in dextrose 5 % 250 mL (0.2 mg/mL) infusion  0-200 mcg/min Intravenous Continuous Karmen Bongo, MD 6 mL/hr at 09/12/21 0823 20 mcg/min at 09/12/21 3716   oxyCODONE (Oxy IR/ROXICODONE) immediate release tablet 5 mg  5 mg Oral QID PRN Karmen Bongo, MD       sevelamer carbonate (RENVELA) tablet 1,600 mg  1,600 mg Oral TID WC Karmen Bongo, MD   1,600 mg at 09/12/21 1717   sodium chloride flush (NS) 0.9 % injection 3 mL  3 mL Intravenous Q12H Karmen Bongo, MD       sodium chloride flush (NS) 0.9 % injection 3 mL  3 mL Intravenous PRN Karmen Bongo, MD       Derrill Memo ON 09/14/2021] sodium zirconium cyclosilicate (LOKELMA) packet 10 g  10 g Oral Once per day on Sun Tue Thu Sat Karmen Bongo, MD       sodium zirconium cyclosilicate [employer]) packet 10 g  10 g Oral Once Valentina Gu, NP       sorbitol 70 % solution 30 mL  30 mL Oral PRN Karmen Bongo, MD       ticagrelor (  BRILINTA) tablet 90 mg  90 mg Oral BID Karmen Bongo, MD   90 mg at 09/13/21 1041   traZODone (DESYREL) tablet 150 mg  150 mg Oral Ivery Quale, MD   150 mg at 09/12/21 2138   vancomycin (VANCOCIN) IVPB 1000 mg/200 mL premix  1,000 mg Intravenous Q M,W,F-HD Carney, Gay Filler, RPH        zolpidem (AMBIEN) tablet 5 mg  5 mg Oral QHS PRN Karmen Bongo, MD       Labs: Basic Metabolic Panel: Recent Labs  Lab 09/12/21 0507 09/13/21 0848  NA 136 135  K 4.5 5.0  CL 92* 92*  CO2 29 28  GLUCOSE 276* 116*  BUN 33* 47*  CREATININE 6.75* 9.27*  CALCIUM 9.9 9.2   Liver Function Tests: No results for input(s): AST, ALT, ALKPHOS, BILITOT, PROT, ALBUMIN in the last 168 hours. No results for input(s): LIPASE, AMYLASE in the last 168 hours. No results for input(s): AMMONIA in the last 168 hours. CBC: Recent Labs  Lab 09/12/21 0507 09/13/21 0848  WBC 7.5 6.9  HGB 10.8* 10.3*  HCT 34.2* 31.9*  MCV 89.5 89.4  PLT 356 345   Cardiac Enzymes: No results for input(s): CKTOTAL, CKMB, CKMBINDEX, TROPONINI in the last 168 hours. CBG: Recent Labs  Lab 09/12/21 1217 09/12/21 1610 09/12/21 2138 09/13/21 0611  GLUCAP 237* 72 148* 123*   Iron Studies: No results for input(s): IRON, TIBC, TRANSFERRIN, FERRITIN in the last 72 hours. Studies/Results: DG Chest 2 View  Result Date: 09/12/2021 CLINICAL DATA:  Chest pain EXAM: CHEST - 2 VIEW COMPARISON:  08/20/2021 FINDINGS: Generous heart size accentuated by low lung volumes. Indistinct perihilar opacity. No pleural fluid or pneumothorax. Right subclavian stenting. IMPRESSION: 1. Indistinct perihilar opacity which could be edema or atelectasis. 2. Chronic cardiomegaly. Electronically Signed   By: Jorje Guild M.D.   On: 09/12/2021 05:36    ROS: As per HPI otherwise negative.   Physical Exam: Vitals:   09/13/21 0000 09/13/21 0322 09/13/21 0400 09/13/21 0742  BP: 102/63  107/65 115/74  Pulse:    74  Resp: 15  16 18   Temp:   98.4 F (36.9 C) 98 F (36.7 C)  TempSrc:   Oral Oral  SpO2: 91%  96%   Weight:  84.3 kg    Height:         General: Pleasant, chronically ill appearing male in no acute distress. Head: Normocephalic, atraumatic, sclera non-icteric, mucus membranes are moist Neck: Supple. JVD not elevated. Lungs:  Clear bilaterally to auscultation without wheezes, rales, or rhonchi. Breathing is unlabored. Heart: RRR with S1 S2. No murmurs, rubs, or gallops appreciated. SR on monitor.  Abdomen: Soft, non-tender, non-distended with normoactive bowel sounds. No rebound/guarding. No obvious abdominal masses. M-S:  Strength and tone appear normal for age. Lower extremities: No LE edema. Wound VAC to L foot S/P toe amps.  Neuro: Alert and oriented X 3. Moves all extremities spontaneously. Psych:  Responds to questions appropriately with a normal affect. Dialysis Access: R AVF aneurysmal + T/B  Dialysis Orders: [facility] MWF 4 hrs 180NRe 450/800 2.0K/2.0 Ca UFP3 AVF -No heparin -Mircera 150 mcg IV q 2 weeks (last dose 200 mcg IV 07/31/2021) -Hectorol 5 mcg IV TIW   Assessment/Plan:  Unstable Angina/NSTEMI: H/O  multi vessel CAD. Troponin peak > 5K. Plan initially was to take to CV lab today however seen by Dr. Angelena Form who opts to treat medically. On NTG/Heparin gtt per primary/CARDS.  Osteomyelitis/L toe gangrene.  On Vancomycin 1 gram IV TIW cefepime 2 grams with HD until 10/11/2021. Continue Flagyl. Per primary. Notified pharmacy of HD schedule change.   ESRD -  MWF. Next HD 09/14/2021. 2.0 K bath.  Will then next be due on Monday to get back on schedule  Hyperkalemia: New issue for patient. Has been on lokelma on non-HD days as OP. Givel Loklema 10 grams today as +5.0 and he will not have HD until tomorrow.   Hypertension/volume  - Volume and BP well controlled. On hydralazine, metoprolol and amlodipine as OP. These have been continued. UF as tolerated.   Anemia  - HBG 10.3. Newly dosed OP ESA hasn't been started yet. Give ESA with HD tomorrow. Follow HGB  Metabolic bone disease - Issues with hyperphosphatemia as OP. Continue binders (Auryxia 2 tabs PO TID AC), VDRA.  Nutrition - Renal/Carb mod diet. Nepro. DMT2-per primary  Jimmye Norman. Owens Shark, NP-C 09/13/2021, 11:54 AM  [facility]  [phone]  Patient seen and examined, agree with above note with above modifications. ESRD patient with known CAD-  presents with CP now controlled with medicine-  thought about cath but now treatment will be medical management-  planning for HD tomorrow off schedule-  then again on Monday if still here.   Appropriate titration of HD related medications Corliss Parish, MD 09/13/2021

## 2021-09-13 NOTE — Progress Notes (Signed)
PROGRESS NOTE    [name]  CXK:[id number] DOB: 15-Sep-1966 DOA: 09/12/2021 PCP: [name], MD   Brief Narrative:  HPI: [name] is a 55 y.o. male with medical history significant of PVD s/p toe amputations;  CAD; DM; ESRD on HD; HTN; and HLD presenting with chest pain. He ate some fish steaks and fries yesterday evening.  He was prepping for today when his chest started hurting about 730pm.  He took NTG and it resolved.  He also vomited. It came back and he took another with resolution.  It started again about 10pm and nagged all night.  He started aching and finally decided to come in.  He had a recent cath and had blockages, but they couldn't figure out why he was having those specific symptoms and so he was managed medically.  He lost 2 toes from prior cath that caused embolization of plaque.  He is still having substernal pain.  Somewhat better than when he got here but still present.  Radiates into his back.  He is still nauseated.  Pain is 7/10.  Pain meds helped, pain was previously "a 12."  This is consistent with prior index symptoms.  He did feel better after vomiting initially.  His wife thinks he is allergic to fish since he also had issues prior when he ate fish.   He was last admitted at [facility] with CP from 11/2-5.  In 09/2020 he had PCI and was noted to have in-stent restenosis of the PCI to the LAD in 03/2021.  He underwent LHC on 08/23/21 with multivessel disease and no targets for PCI noted.  There were no medication changes made following this hospitalization.   He returned from 11/12-16 with toe gangrene that was thought to be related to embolic event from [facility].  He underwent amputation of the L 1/2 toes and had a wound vac placed.    Assessment & Plan:   Principal Problem:   ACS (acute coronary syndrome) (HCC) Active Problems:   Essential hypertension   ESRD on dialysis ([place])   Type 2 diabetes mellitus ([place])   Dyslipidemia   NSTEMI (non-ST elevated myocardial  infarction) ([place])   PAD (peripheral artery disease) ([place])   Prolonged QT interval  NSTEMI: Presented with chest pain.  Troponin significantly elevated, NSTEMI ruled in.  Patient was started on heparin and nitroglycerin drip.  This morning when I saw him, he was symptom-free but then as soon as his nitroglycerin was stopped, he started having chest pain and that was restarted per cardiology.  Briefly, patient has a history of extensive three-vessel CAD and he has had multiple cardiac caths in the past with recent 1 completed just 3 weeks ago at [facility], he follows [place] cardiology closely.  This cath ended up having complication of embolization causing osteomyelitis and amputation of his toes.  They found diffuse three-vessel disease with no target of PCI.  Cardiology here has discussed with multiple family members of the patient, patient and his cardiologist at [facility] and they have decided not to do any cardiac cath here due to very low chances of finding any target for PCI and they have recommended to continue heparin drip for 48 hours and possible discharge on Sunday if he remains a stable.  We will continue DAPT per cardiology recommendation and monitor closely.  Appreciate cardiology help and defer management to them.   ESRD on HD: MWF schedule.  Nephrologist consulted.  Defer to them.  HTN: Blood pressure controlled,  continue Norvasc, Hydralazine, Toprol XL, as needed hydralazine.   HLD: -Lipids were checked in 09/2020 (132/69/48/73) -Reports prior intolerance to statins -On Repatha as an outpatient   DM: Hemoglobin A1c 6.6.  Does not seem to be taking any oral hypoglycemic agents at home.  Continue SSI, blood sugar controlled.  Prolonged QTc -Will attempt to avoid QT-prolonging medications such as PPI, nausea meds, SSRIs.  We will repeat EKG in the morning.   PAD -s/p remote R TMA and recent L 1/2 toe amputations -Wound vac is in place on L foot.  Will resume all medications that were  recommended from [facility] which include cefepime, vancomycin and Flagyl.  DVT prophylaxis:    Code Status: Full Code  Family Communication:  None present at bedside.  Plan of care discussed with patient in length and he verbalized understanding and agreed with it.  Status is: Inpatient  Remains inpatient appropriate because: Needs management for NSTEMI with IV heparin.  Estimated body mass index is 26.67 kg/m as calculated from the following:   Height as of this encounter: 5\' 10"  (1.778 m).   Weight as of this encounter: 84.3 kg.  Pressure Injury 09/21/20 Foot Anterior;Right Unstageable - Full thickness tissue loss in which the base of the injury is covered by slough (yellow, tan, gray, green or brown) and/or eschar (tan, brown or black) in the wound bed. (Active)  09/21/20 2000  Location: Foot  Location Orientation: Anterior;Right  Staging: Unstageable - Full thickness tissue loss in which the base of the injury is covered by slough (yellow, tan, gray, green or brown) and/or eschar (tan, brown or black) in the wound bed.  Wound Description (Comments):   Present on Admission: Yes    Nutritional Assessment: Body mass index is 26.67 kg/m.Marland Kitchen Seen by dietician.  I agree with the assessment and plan as outlined below: Nutrition Status:   Skin Assessment: I have examined the patient's skin and I agree with the wound assessment as performed by the wound care RN as outlined below: Pressure Injury 09/21/20 Foot Anterior;Right Unstageable - Full thickness tissue loss in which the base of the injury is covered by slough (yellow, tan, gray, green or brown) and/or eschar (tan, brown or black) in the wound bed. (Active)  09/21/20 2000  Location: Foot  Location Orientation: Anterior;Right  Staging: Unstageable - Full thickness tissue loss in which the base of the injury is covered by slough (yellow, tan, gray, green or brown) and/or eschar (tan, brown or black) in the wound bed.  Wound Description  (Comments):   Present on Admission: Yes    Consultants:  Cardiology  Procedures:  None  Antimicrobials:  Anti-infectives (From admission, onward)    Start     Dose/Rate Route Frequency Ordered Stop   09/13/21 1800  vancomycin (VANCOCIN) IVPB 1000 mg/200 mL premix        1,000 mg 200 mL/hr over 60 Minutes Intravenous Every M-W-F (Hemodialysis) 09/13/21 1105 10/13/21 2359   09/13/21 1800  ceFEPIme (MAXIPIME) 2 g in sodium chloride 0.9 % 100 mL IVPB        2 g 200 mL/hr over 30 Minutes Intravenous Every M-W-F (Hemodialysis) 09/13/21 1105 10/13/21 2359   09/12/21 2300  metroNIDAZOLE (FLAGYL) tablet 500 mg        500 mg Oral Every 12 hours 09/12/21 2210            Subjective: Patient seen and examined.  He had no chest pain.  He was very concerned about the fact that  cardiology was planning to do cardiac cath and he did not want any cardiac cath here.  He had lengthy discussion with cardiologist after my visit.  Objective: Vitals:   09/13/21 0000 09/13/21 0322 09/13/21 0400 09/13/21 0742  BP: 102/63  107/65 115/74  Pulse:    74  Resp: 15  16 18   Temp:   98.4 F (36.9 C) 98 F (36.7 C)  TempSrc:   Oral Oral  SpO2: 91%  96%   Weight:  84.3 kg    Height:        Intake/Output Summary (Last 24 hours) at 09/13/2021 1133 Last data filed at 09/13/2021 0100 Gross per 24 hour  Intake 487.75 ml  Output --  Net 487.75 ml   Filed Weights   09/12/21 0600 09/12/21 1302 09/13/21 0322  Weight: 88 kg 83.9 kg 84.3 kg    Examination:  General exam: Appears calm and comfortable  Respiratory system: Clear to auscultation. Respiratory effort normal. Cardiovascular system: S1 & S2 heard, RRR. No JVD, murmurs, rubs, gallops or clicks. No pedal edema. Gastrointestinal system: Abdomen is nondistended, soft and nontender. No organomegaly or masses felt. Normal bowel sounds heard. Central nervous system: Alert and oriented. No focal neurological deficits. Extremities: Symmetric 5 x 5  power. Skin: No rashes, lesions or ulcers Psychiatry: Judgement and insight appear normal. Mood & affect appropriate.    Data Reviewed: I have personally reviewed following labs and imaging studies  CBC: Recent Labs  Lab 09/12/21 0507 09/13/21 0848  WBC 7.5 6.9  HGB 10.8* 10.3*  HCT 34.2* 31.9*  MCV 89.5 89.4  PLT 356 811   Basic Metabolic Panel: Recent Labs  Lab 09/12/21 0507 09/13/21 0848  NA 136 135  K 4.5 5.0  CL 92* 92*  CO2 29 28  GLUCOSE 276* 116*  BUN 33* 47*  CREATININE 6.75* 9.27*  CALCIUM 9.9 9.2   GFR: Estimated Creatinine Clearance: 9.3 mL/min (A) (by C-G formula based on SCr of 9.27 mg/dL (H)). Liver Function Tests: No results for input(s): AST, ALT, ALKPHOS, BILITOT, PROT, ALBUMIN in the last 168 hours. No results for input(s): LIPASE, AMYLASE in the last 168 hours. No results for input(s): AMMONIA in the last 168 hours. Coagulation Profile: Recent Labs  Lab 09/13/21 0848  INR 1.1   Cardiac Enzymes: No results for input(s): CKTOTAL, CKMB, CKMBINDEX, TROPONINI in the last 168 hours. BNP (last 3 results) No results for input(s): PROBNP in the last 8760 hours. HbA1C: Recent Labs    09/12/21 0507  HGBA1C 6.6*   CBG: Recent Labs  Lab 09/12/21 1217 09/12/21 1610 09/12/21 2138 09/13/21 0611  GLUCAP 237* 72 148* 123*   Lipid Profile: Recent Labs    09/12/21 0454  CHOL 144  HDL 24*  LDLCALC 50  TRIG 352*  CHOLHDL 6.0   Thyroid Function Tests: No results for input(s): TSH, T4TOTAL, FREET4, T3FREE, THYROIDAB in the last 72 hours. Anemia Panel: No results for input(s): VITAMINB12, FOLATE, FERRITIN, TIBC, IRON, RETICCTPCT in the last 72 hours. Sepsis Labs: No results for input(s): PROCALCITON, LATICACIDVEN in the last 168 hours.  No results found for this or any previous visit (from the past 240 hour(s)).    Radiology Studies: DG Chest 2 View  Result Date: 09/12/2021 CLINICAL DATA:  Chest pain EXAM: CHEST - 2 VIEW COMPARISON:   08/20/2021 FINDINGS: Generous heart size accentuated by low lung volumes. Indistinct perihilar opacity. No pleural fluid or pneumothorax. Right subclavian stenting. IMPRESSION: 1. Indistinct perihilar opacity which could be edema  or atelectasis. 2. Chronic cardiomegaly. Electronically Signed   By: Jorje Guild M.D.   On: 09/12/2021 05:36    Scheduled Meds:  amLODipine  10 mg Oral QHS   aspirin EC  81 mg Oral Daily   hydrALAZINE  50 mg Oral TID   insulin aspart  0-6 Units Subcutaneous TID WC   metoprolol succinate  100 mg Oral QHS   metroNIDAZOLE  500 mg Oral Q12H   sevelamer carbonate  1,600 mg Oral TID WC   sodium chloride flush  3 mL Intravenous Q12H   [START ON 09/14/2021] sodium zirconium cyclosilicate  10 g Oral Once per day on Sun Tue Thu Sat   sodium zirconium cyclosilicate  10 g Oral Once   ticagrelor  90 mg Oral BID   traZODone  150 mg Oral QHS   Continuous Infusions:  sodium chloride     sodium chloride     ceFEPime (MAXIPIME) IV     heparin 1,500 Units/hr (09/13/21 0033)   nitroGLYCERIN 20 mcg/min (09/12/21 0823)   vancomycin       LOS: 0 days   Time spent: 35 minutes   Darliss Cheney, MD [facility]  09/13/2021, 11:33 AM  Please page via [place] [place] and do not message via secure chat for anything urgent. Secure chat can be used for anything non urgent.  How to contact the [facility] Attending or Consulting provider [place] or covering provider during after hours [place], for this patient?  Check the care team in [facility] and look for a) attending/consulting [facility] provider listed and b) the [facility] team listed. Page or secure chat [phone]. Log into [email or web address] and use [place]'s universal password to access. If you do not have the password, please contact the hospital operator. Locate the [facility] provider you are looking for under [facility] and page to a number that you can be directly reached. If you still have difficulty reaching the provider, please page the [facility] (Director  on Call) for the Hospitalists listed on amion for assistance.

## 2021-09-13 NOTE — Progress Notes (Addendum)
Pharmacy Antibiotic Note  [name] is a 55 y.o. male admitted on 09/12/2021 with  osteomyelitis, s/p toe amputations .  Pharmacy has been consulted for vancomycin and cefepime dosing.  Per records from [facility] surgery team, it appears patient should receive IV vancomycin and cefepime with each HD through 10/13/21.  Also on po metronidazole.   Plan: Vancomycin 1g IV after each HD. Cefepime 2g IV after each HD. F/u vancomycin level at steady state if still in hospital.  Spoke with Juanell Fairly, NP - pt will dialyze tomorrow (Sat 11/26) off schedule - re-timed antibiotics appropriately.  Height: 5\' 10"  (177.8 cm) Weight: 84.3 kg (185 lb 13.6 oz) IBW/kg (Calculated) : 73  Temp (24hrs), Avg:97.9 F (36.6 C), Min:97.3 F (36.3 C), Max:98.4 F (36.9 C)  Recent Labs  Lab 09/12/21 0507 09/13/21 0848  WBC 7.5 6.9  CREATININE 6.75* 9.27*    Estimated Creatinine Clearance: 9.3 mL/min (A) (by C-G formula based on SCr of 9.27 mg/dL (H)).    Allergies  Allergen Reactions   Atorvastatin     Other reaction(s): Muscle Pain   Rosuvastatin     Other reaction(s): Muscle Pain   Amoxicillin Itching    Did it involve swelling of the face/tongue/throat, SOB, or low BP? Unknown Did it involve sudden or severe rash/hives, skin peeling, or any reaction on the inside of your mouth or nose? Unknown Did you need to seek medical attention at a hospital or doctor's office? Unknown When did it last happen?      20 years ago If all above answers are "NO", may proceed with cephalosporin use.    Chlorhexidine Hives    Patient has blistering   Hydromorphone Itching    Patient states he may take with benadryl. Makes feet itch.   Percocet [Oxycodone-Acetaminophen] Itching and Other (See Comments)    Legs only. Itching and burning feeling.   Morphine     Other reaction(s): rash/itching All narcotics per pt   Gabapentin Nausea And Vomiting    Stomach issues      Thank you for allowing pharmacy to be a  part of this patient's care.  [place] Crane, Roylene Reason, BCCP Clinical Pharmacist  09/13/2021 10:31 AM   [facility] pharmacy phone numbers are listed on [email or web address]

## 2021-09-13 NOTE — Progress Notes (Signed)
Patient complaining of chest pain. Restarted pt's Nitroglycerin at 20 mcg/min.

## 2021-09-14 DIAGNOSIS — I249 Acute ischemic heart disease, unspecified: Secondary | ICD-10-CM | POA: Diagnosis not present

## 2021-09-14 LAB — CBC
HCT: 27.2 % — ABNORMAL LOW (ref 39.0–52.0)
Hemoglobin: 8.8 g/dL — ABNORMAL LOW (ref 13.0–17.0)
MCH: 28.4 pg (ref 26.0–34.0)
MCHC: 32.4 g/dL (ref 30.0–36.0)
MCV: 87.7 fL (ref 80.0–100.0)
Platelets: 294 10*3/uL (ref 150–400)
RBC: 3.1 MIL/uL — ABNORMAL LOW (ref 4.22–5.81)
RDW: 17.7 % — ABNORMAL HIGH (ref 11.5–15.5)
WBC: 4.6 10*3/uL (ref 4.0–10.5)
nRBC: 0 % (ref 0.0–0.2)

## 2021-09-14 LAB — GLUCOSE, CAPILLARY
Glucose-Capillary: 132 mg/dL — ABNORMAL HIGH (ref 70–99)
Glucose-Capillary: 132 mg/dL — ABNORMAL HIGH (ref 70–99)
Glucose-Capillary: 110 mg/dL — ABNORMAL HIGH (ref 70–99)
Glucose-Capillary: 145 mg/dL — ABNORMAL HIGH (ref 70–99)

## 2021-09-14 LAB — RENAL FUNCTION PANEL
Albumin: 2.7 g/dL — ABNORMAL LOW (ref 3.5–5.0)
Anion gap: 14 (ref 5–15)
BUN: 66 mg/dL — ABNORMAL HIGH (ref 6–20)
CO2: 27 mmol/L (ref 22–32)
Calcium: 9 mg/dL (ref 8.9–10.3)
Chloride: 93 mmol/L — ABNORMAL LOW (ref 98–111)
Creatinine, Ser: 11.45 mg/dL — ABNORMAL HIGH (ref 0.61–1.24)
GFR, Estimated: 5 mL/min — ABNORMAL LOW (ref >60)
Glucose, Bld: 128 mg/dL — ABNORMAL HIGH (ref 70–99)
Phosphorus: 8.2 mg/dL — ABNORMAL HIGH (ref 2.5–4.6)
Potassium: 5.2 mmol/L — ABNORMAL HIGH (ref 3.5–5.1)
Sodium: 134 mmol/L — ABNORMAL LOW (ref 135–145)

## 2021-09-14 LAB — HEPATITIS B SURFACE ANTIGEN: Hepatitis B Surface Ag: NONREACTIVE

## 2021-09-14 LAB — HEPARIN LEVEL (UNFRACTIONATED): Heparin Unfractionated: 0.23 IU/mL — ABNORMAL LOW (ref 0.30–0.70)

## 2021-09-14 LAB — HEPATITIS B SURFACE ANTIBODY,QUALITATIVE: Hep B S Ab: REACTIVE — AB

## 2021-09-14 MED ORDER — SODIUM CHLORIDE 0.9 % IV SOLN
2.0000 g | Freq: Once | INTRAVENOUS | Status: AC
  Administered 2021-09-14: 2 g via INTRAVENOUS
  Filled 2021-09-14: qty 2

## 2021-09-14 MED ORDER — AMLODIPINE BESYLATE 5 MG PO TABS
5.0000 mg | ORAL_TABLET | Freq: Every day | ORAL | Status: DC
  Administered 2021-09-14: 5 mg via ORAL
  Filled 2021-09-14: qty 1

## 2021-09-14 MED ORDER — ISOSORBIDE MONONITRATE ER 30 MG PO TB24
30.0000 mg | ORAL_TABLET | Freq: Every day | ORAL | Status: DC
  Administered 2021-09-14 – 2021-09-15 (×2): 30 mg via ORAL
  Filled 2021-09-14 (×2): qty 1

## 2021-09-14 MED ORDER — VANCOMYCIN HCL IN DEXTROSE 1-5 GM/200ML-% IV SOLN
1000.0000 mg | Freq: Once | INTRAVENOUS | Status: AC
  Administered 2021-09-14: 1000 mg via INTRAVENOUS
  Filled 2021-09-14: qty 200

## 2021-09-14 MED ORDER — SODIUM ZIRCONIUM CYCLOSILICATE 10 G PO PACK
10.0000 g | PACK | ORAL | Status: DC
  Administered 2021-09-15: 10:00:00 10 g via ORAL
  Filled 2021-09-14: qty 1

## 2021-09-14 MED ORDER — EZETIMIBE 10 MG PO TABS: 10.0000 mg | ORAL_TABLET | Freq: Every day | ORAL | Status: DC

## 2021-09-14 NOTE — Progress Notes (Signed)
PROGRESS NOTE    [name]  OAC:[id number] DOB: August 27, 1966 DOA: 09/12/2021 PCP: [name], MD   Brief Narrative:  HPI: [name] is a 55 y.o. male with medical history significant of PVD s/p toe amputations;  CAD; DM; ESRD on HD; HTN; and HLD presenting with chest pain. He ate some fish steaks and fries yesterday evening.  He was prepping for today when his chest started hurting about 730pm.  He took NTG and it resolved.  He also vomited. It came back and he took another with resolution.  It started again about 10pm and nagged all night.  He started aching and finally decided to come in.  He had a recent cath and had blockages, but they couldn't figure out why he was having those specific symptoms and so he was managed medically.  He lost 2 toes from prior cath that caused embolization of plaque.  He is still having substernal pain.  Somewhat better than when he got here but still present.  Radiates into his back.  He is still nauseated.  Pain is 7/10.  Pain meds helped, pain was previously "a 12."  This is consistent with prior index symptoms.  He did feel better after vomiting initially.  His wife thinks he is allergic to fish since he also had issues prior when he ate fish.   He was last admitted at [facility] with CP from 11/2-5.  In 09/2020 he had PCI and was noted to have in-stent restenosis of the PCI to the LAD in 03/2021.  He underwent LHC on 08/23/21 with multivessel disease and no targets for PCI noted.  There were no medication changes made following this hospitalization.   He returned from 11/12-16 with toe gangrene that was thought to be related to embolic event from [facility].  He underwent amputation of the L 1/2 toes and had a wound vac placed.    Assessment & Plan:   Principal Problem:   ACS (acute coronary syndrome) (HCC) Active Problems:   Essential hypertension   ESRD on dialysis ([place])   Type 2 diabetes mellitus ([place])   Dyslipidemia   NSTEMI (non-ST elevated myocardial  infarction) ([place])   PAD (peripheral artery disease) ([place])   Prolonged QT interval  NSTEMI: Presented with chest pain.  Troponin significantly elevated, NSTEMI ruled in.  Patient was started on heparin and nitroglycerin drip. patient has a history of extensive three-vessel CAD and he has had multiple cardiac caths in the past with recent 1 completed just 3 weeks ago at [facility], he follows [facility] closely.  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  They found diffuse three-vessel disease with no target of PCI.  Cardiology here has discussed with multiple family members of the patient, patient and his cardiologist at [facility] and they have decided not to do any cardiac cath here due to very low chances of finding any target for PCI and they have recommended to continue heparin drip for 48 hours.  His nitroglycerin and heparin drip was stopped this morning after completion of 48 hours and he has remained chest pain-free.  Cardiology has recommended to monitor overnight and discharge home tomorrow if stable.   ESRD on HD: MWF schedule.  Nephrologist consulted.  Defer to them.  He is going to get dialysis today.  HTN: Blood pressure controlled, continue Norvasc, Hydralazine, Toprol XL, as needed hydralazine.   HLD: -Lipids were checked in 09/2020 (  132/69/48/73) -Reports prior intolerance to statins -On Repatha as an outpatient   DM: Hemoglobin A1c 6.6.  Does not seem to be taking any oral hypoglycemic agents at home.  Continue SSI, blood sugar controlled.  Prolonged QTc -Will attempt to avoid QT-prolonging medications such as PPI, nausea meds, SSRIs.  We will repeat EKG today.   PAD -s/p remote R TMA and recent L 1/2 toe amputations -Wound vac is in place on L foot.  Continue antibiotics cefepime, vancomycin and Flagyl as recommended by [facility].  He tells me that his wound VAC was supposed to be changed  yesterday but since he is in the hospital, this cannot be done.  He is requesting this be changed.  Wound care has been consulted but they are not available in-house to service this patient today.  DVT prophylaxis:    Code Status: Full Code  Family Communication:  None present at bedside.  Plan of care discussed with patient in length and he verbalized understanding and agreed with it.  Status is: Inpatient  Remains inpatient appropriate because: Needs management for NSTEMI with IV heparin.  Estimated body mass index is 26.95 kg/m as calculated from the following:   Height as of this encounter: 5\' 10"  (1.778 m).   Weight as of this encounter: 85.2 kg.  Pressure Injury 09/21/20 Foot Anterior;Right Unstageable - Full thickness tissue loss in which the base of the injury is covered by slough (yellow, tan, gray, green or brown) and/or eschar (tan, brown or black) in the wound bed. (Active)  09/21/20 2000  Location: Foot  Location Orientation: Anterior;Right  Staging: Unstageable - Full thickness tissue loss in which the base of the injury is covered by slough (yellow, tan, gray, green or brown) and/or eschar (tan, brown or black) in the wound bed.  Wound Description (Comments):   Present on Admission: Yes    Nutritional Assessment: Body mass index is 26.95 kg/m.Marland Kitchen Seen by dietician.  I agree with the assessment and plan as outlined below: Nutrition Status:   Skin Assessment: I have examined the patient's skin and I agree with the wound assessment as performed by the wound care RN as outlined below: Pressure Injury 09/21/20 Foot Anterior;Right Unstageable - Full thickness tissue loss in which the base of the injury is covered by slough (yellow, tan, gray, green or brown) and/or eschar (tan, brown or black) in the wound bed. (Active)  09/21/20 2000  Location: Foot  Location Orientation: Anterior;Right  Staging: Unstageable - Full thickness tissue loss in which the base of the injury is  covered by slough (yellow, tan, gray, green or brown) and/or eschar (tan, brown or black) in the wound bed.  Wound Description (Comments):   Present on Admission: Yes    Consultants:  Cardiology  Procedures:  None  Antimicrobials:  Anti-infectives (From admission, onward)    Start     Dose/Rate Route Frequency Ordered Stop   09/16/21 1200  vancomycin (VANCOCIN) IVPB 1000 mg/200 mL premix        1,000 mg 200 mL/hr over 60 Minutes Intravenous Every M-W-F (Hemodialysis) 09/13/21 1242 10/16/21 1159   09/16/21 1200  ceFEPIme (MAXIPIME) 2 g in sodium chloride 0.9 % 100 mL IVPB        2 g 200 mL/hr over 30 Minutes Intravenous Every M-W-F (Hemodialysis) 09/13/21 1242 10/16/21 1159   09/14/21 1800  vancomycin (VANCOCIN) IVPB 1000 mg/200 mL premix  Status:  Discontinued        1,000 mg 200 mL/hr over 60 Minutes  Intravenous  Once 09/13/21 1242 09/14/21 0921   09/14/21 1800  vancomycin (VANCOCIN) IVPB 1000 mg/200 mL premix        1,000 mg 200 mL/hr over 60 Minutes Intravenous  Once 09/14/21 0921     09/14/21 1200  ceFEPIme (MAXIPIME) 2 g in sodium chloride 0.9 % 100 mL IVPB  Status:  Discontinued        2 g 200 mL/hr over 30 Minutes Intravenous  Once 09/13/21 1242 09/14/21 0921   09/14/21 1200  ceFEPIme (MAXIPIME) 2 g in sodium chloride 0.9 % 100 mL IVPB        2 g 200 mL/hr over 30 Minutes Intravenous  Once 09/14/21 0921     09/13/21 1800  vancomycin (VANCOCIN) IVPB 1000 mg/200 mL premix  Status:  Discontinued        1,000 mg 200 mL/hr over 60 Minutes Intravenous Every M-W-F (Hemodialysis) 09/13/21 1105 09/13/21 1242   09/13/21 1800  ceFEPIme (MAXIPIME) 2 g in sodium chloride 0.9 % 100 mL IVPB  Status:  Discontinued        2 g 200 mL/hr over 30 Minutes Intravenous Every M-W-F (Hemodialysis) 09/13/21 1105 09/13/21 1242   09/12/21 2300  metroNIDAZOLE (FLAGYL) tablet 500 mg        500 mg Oral Every 12 hours 09/12/21 2210            Subjective:  Patient seen and examined.  He is off  of nitroglycerin and heparin drip.  He has no chest pain or other complaint.  Objective: Vitals:   09/14/21 0008 09/14/21 0400 09/14/21 0726 09/14/21 1104  BP: 114/82 118/71 119/60 (!) 145/83  Pulse:   68 73  Resp: 15 16 19 18   Temp: 98 F (36.7 C) 98.4 F (36.9 C) 97.9 F (36.6 C) 97.9 F (36.6 C)  TempSrc: Oral Oral Oral Oral  SpO2: 90% 94% 95% 97%  Weight:  85.2 kg    Height:        Intake/Output Summary (Last 24 hours) at 09/14/2021 1224 Last data filed at 09/14/2021 0400 Gross per 24 hour  Intake 813.52 ml  Output --  Net 813.52 ml    Filed Weights   09/12/21 1302 09/13/21 0322 09/14/21 0400  Weight: 83.9 kg 84.3 kg 85.2 kg    Examination:  General exam: Appears calm and comfortable  Respiratory system: Clear to auscultation. Respiratory effort normal. Cardiovascular system: S1 & S2 heard, RRR. No JVD, murmurs, rubs, gallops or clicks. No pedal edema. Gastrointestinal system: Abdomen is nondistended, soft and nontender. No organomegaly or masses felt. Normal bowel sounds heard. Central nervous system: Alert and oriented. No focal neurological deficits. Extremities: Wound VAC attached to the left foot, amputated left first and second toe. Skin: No rashes, lesions or ulcers.  Psychiatry: Judgement and insight appear normal. Mood & affect appropriate.    Data Reviewed: I have personally reviewed following labs and imaging studies  CBC: Recent Labs  Lab 09/12/21 0507 09/13/21 0848 09/14/21 0338  WBC 7.5 6.9 4.6  HGB 10.8* 10.3* 8.8*  HCT 34.2* 31.9* 27.2*  MCV 89.5 89.4 87.7  PLT 356 345 841    Basic Metabolic Panel: Recent Labs  Lab 09/12/21 0507 09/13/21 0848  NA 136 135  K 4.5 5.0  CL 92* 92*  CO2 29 28  GLUCOSE 276* 116*  BUN 33* 47*  CREATININE 6.75* 9.27*  CALCIUM 9.9 9.2    GFR: Estimated Creatinine Clearance: 9.3 mL/min (A) (by C-G formula based on SCr of 9.27  mg/dL (H)). Liver Function Tests: No results for input(s): AST, ALT,  ALKPHOS, BILITOT, PROT, ALBUMIN in the last 168 hours. No results for input(s): LIPASE, AMYLASE in the last 168 hours. No results for input(s): AMMONIA in the last 168 hours. Coagulation Profile: Recent Labs  Lab 09/13/21 0848  INR 1.1    Cardiac Enzymes: No results for input(s): CKTOTAL, CKMB, CKMBINDEX, TROPONINI in the last 168 hours. BNP (last 3 results) No results for input(s): PROBNP in the last 8760 hours. HbA1C: Recent Labs    09/12/21 0507  HGBA1C 6.6*    CBG: Recent Labs  Lab 09/13/21 1252 09/13/21 1644 09/13/21 2059 09/14/21 0557 09/14/21 1109  GLUCAP 164* 116* 186* 132* 132*    Lipid Profile: Recent Labs    09/12/21 0454  CHOL 144  HDL 24*  LDLCALC 50  TRIG 352*  CHOLHDL 6.0    Thyroid Function Tests: No results for input(s): TSH, T4TOTAL, FREET4, T3FREE, THYROIDAB in the last 72 hours. Anemia Panel: No results for input(s): VITAMINB12, FOLATE, FERRITIN, TIBC, IRON, RETICCTPCT in the last 72 hours. Sepsis Labs: No results for input(s): PROCALCITON, LATICACIDVEN in the last 168 hours.  No results found for this or any previous visit (from the past 240 hour(s)).    Radiology Studies: No results found.  Scheduled Meds:  amLODipine  5 mg Oral QHS   aspirin EC  81 mg Oral Daily   darbepoetin (ARANESP) injection - DIALYSIS  40 mcg Intravenous Once   doxercalciferol  5 mcg Intravenous Q M,W,F-HD   ferric citrate  420 mg Oral TID WC   hydrALAZINE  50 mg Oral TID   insulin aspart  0-6 Units Subcutaneous TID WC   isosorbide mononitrate  30 mg Oral Daily   metoprolol succinate  100 mg Oral QHS   metroNIDAZOLE  500 mg Oral Q12H   sodium chloride flush  3 mL Intravenous Q12H   [START ON 09/15/2021] sodium zirconium cyclosilicate  10 g Oral Once per day on Sun Tue Thu Sat   ticagrelor  90 mg Oral BID   traZODone  150 mg Oral QHS   Continuous Infusions:  sodium chloride     sodium chloride     sodium chloride     sodium chloride     [START ON  09/16/2021] ceFEPime (MAXIPIME) IV     ceFEPime (MAXIPIME) IV     [START ON 09/16/2021] vancomycin     vancomycin       LOS: 1 day   Time spent: 30 minutes   Darliss Cheney, MD [facility]  09/14/2021, 12:24 PM  Please page via [facility] and do not message via secure chat for anything urgent. Secure chat can be used for anything non urgent.  How to contact the [facility] Attending or Consulting provider [place] or covering provider during after hours [place], for this patient?  Check the care team in [facility] and look for a) attending/consulting [facility] provider listed and b) the [facility] team listed. Page or secure chat [phone]. Log into [email or web address] and use [place]'s universal password to access. If you do not have the password, please contact the hospital operator. Locate the [facility] provider you are looking for under [facility] and page to a number that you can be directly reached. If you still have difficulty reaching the provider, please page the [name] (Director on Call) for the Hospitalists listed on amion for assistance.

## 2021-09-14 NOTE — Progress Notes (Addendum)
Cardiology Progress Note  Patient ID: [name] MRN: [id number] DOB: March 30, 1966 Date of Encounter: 09/14/2021  Primary Cardiologist: Candee Furbish, MD  Subjective   Chief Complaint: None.   HPI: Denies chest pain or trouble breathing.  Resting comfortably this morning.  Remains on heparin.  ROS:  All other ROS reviewed and negative. Pertinent positives noted in the HPI.     Inpatient Medications  Scheduled Meds:  amLODipine  5 mg Oral QHS   aspirin EC  81 mg Oral Daily   darbepoetin (ARANESP) injection - DIALYSIS  40 mcg Intravenous Once   doxercalciferol  5 mcg Intravenous Q M,W,F-HD   ferric citrate  420 mg Oral TID WC   hydrALAZINE  50 mg Oral TID   insulin aspart  0-6 Units Subcutaneous TID WC   metoprolol succinate  100 mg Oral QHS   metroNIDAZOLE  500 mg Oral Q12H   sodium chloride flush  3 mL Intravenous Q12H   [START ON 09/15/2021] sodium zirconium cyclosilicate  10 g Oral Once per day on Sun Tue Thu Sat   ticagrelor  90 mg Oral BID   traZODone  150 mg Oral QHS   Continuous Infusions:  sodium chloride     sodium chloride     sodium chloride     sodium chloride     [START ON 09/16/2021] ceFEPime (MAXIPIME) IV     ceFEPime (MAXIPIME) IV     heparin 1,500 Units/hr (09/14/21 1015)   nitroGLYCERIN Stopped (09/14/21 0811)   [START ON 09/16/2021] vancomycin     vancomycin     PRN Meds: sodium chloride, sodium chloride, sodium chloride, acetaminophen, calcium carbonate (dosed in mg elemental calcium), camphor-menthol **AND** hydrOXYzine, docusate sodium, feeding supplement (NEPRO CARB STEADY), fentaNYL (SUBLIMAZE) injection, lidocaine (PF), lidocaine-prilocaine, oxyCODONE, pentafluoroprop-tetrafluoroeth, sodium chloride flush, sorbitol, zolpidem   Vital Signs   Vitals:   09/13/21 2100 09/14/21 0008 09/14/21 0400 09/14/21 0726  BP: (!) 156/91 114/82 118/71 119/60  Pulse:    68  Resp: 13 15 16 19   Temp:  98 F (36.7 C) 98.4 F (36.9 C) 97.9 F (36.6 C)   TempSrc:  Oral Oral Oral  SpO2: 97% 90% 94% 95%  Weight:   85.2 kg   Height:        Intake/Output Summary (Last 24 hours) at 09/14/2021 1051 Last data filed at 09/14/2021 0400 Gross per 24 hour  Intake 813.52 ml  Output --  Net 813.52 ml   Last 3 Weights 09/14/2021 09/13/2021 09/12/2021  Weight (lbs) 187 lb 13.3 oz 185 lb 13.6 oz 184 lb 15.5 oz  Weight (kg) 85.2 kg 84.3 kg 83.9 kg      Telemetry  Overnight telemetry shows SR 70s, which I personally reviewed.    Physical Exam   Vitals:   09/13/21 2100 09/14/21 0008 09/14/21 0400 09/14/21 0726  BP: (!) 156/91 114/82 118/71 119/60  Pulse:    68  Resp: 13 15 16 19   Temp:  98 F (36.7 C) 98.4 F (36.9 C) 97.9 F (36.6 C)  TempSrc:  Oral Oral Oral  SpO2: 97% 90% 94% 95%  Weight:   85.2 kg   Height:        Intake/Output Summary (Last 24 hours) at 09/14/2021 1051 Last data filed at 09/14/2021 0400 Gross per 24 hour  Intake 813.52 ml  Output --  Net 813.52 ml    Last 3 Weights 09/14/2021 09/13/2021 09/12/2021  Weight (lbs) 187 lb 13.3 oz 185 lb 13.6 oz 184 lb 15.5  oz  Weight (kg) 85.2 kg 84.3 kg 83.9 kg    Body mass index is 26.95 kg/m.  General: Well nourished, well developed, in no acute distress Head: Atraumatic, normal size  Eyes: PEERLA, EOMI  Neck: Supple, no JVD Endocrine: No thryomegaly Cardiac: Normal S1, S2; RRR; 2 out of 6 systolic ejection murmur Lungs: Clear to auscultation bilaterally, no wheezing, rhonchi or rales  Abd: Soft, nontender, no hepatomegaly  Ext: No edema, pulses 2+ Musculoskeletal: No deformities Skin: Warm and dry, no rashes   Neuro: Alert and oriented to person, place, time, and situation, CNII-XII grossly intact, no focal deficits  Psych: Normal mood and affect   Labs  High Sensitivity Troponin:   Recent Labs  Lab 08/20/21 1919 09/12/21 0507 09/12/21 0749 09/12/21 1458 09/12/21 1708  TROPONINIHS 36* 29* 46* 4,550* 5,495*     Cardiac EnzymesNo results for input(s):  TROPONINI in the last 168 hours. No results for input(s): TROPIPOC in the last 168 hours.  Chemistry Recent Labs  Lab 09/12/21 0507 09/13/21 0848  NA 136 135  K 4.5 5.0  CL 92* 92*  CO2 29 28  GLUCOSE 276* 116*  BUN 33* 47*  CREATININE 6.75* 9.27*  CALCIUM 9.9 9.2  GFRNONAA 9* 6*  ANIONGAP 15 15    Hematology Recent Labs  Lab 09/12/21 0507 09/13/21 0848 09/14/21 0338  WBC 7.5 6.9 4.6  RBC 3.82* 3.57* 3.10*  HGB 10.8* 10.3* 8.8*  HCT 34.2* 31.9* 27.2*  MCV 89.5 89.4 87.7  MCH 28.3 28.9 28.4  MCHC 31.6 32.3 32.4  RDW 17.8* 17.6* 17.7*  PLT 356 345 294   BNPNo results for input(s): BNP, PROBNP in the last 168 hours.  DDimer No results for input(s): DDIMER in the last 168 hours.   Radiology  No results found.  Cardiac Studies   TTE 08/22/2021 INTERPRETATION  NORMAL LV SYSTOLIC FUNCTION WITH MILD LVH  NO SIGNIFICANT VALVULAR DYSFUNCTION  NORMAL RV SIZE AND FUNCTION  NORMAL RIGHT ATRIAL PRESSURE  COMPARED TO PREVIOUS STUDY ON 08/02/20, MILD AORTIC VALVE STENOSIS NOW PRESENT   LHC 08/23/2021 1.  The proximal LAD has diffuse mild disease.  Within the mid LAD there is a long segment of stent with modest 30 to 40% in-stent restenosis.  The apical LAD is subtotally occluded.  It is a very small vessel in the segment.  There is a very small first   diagonal branch which is also subtotally occluded.  A medium sized second diagonal branch has a stent in its ostial segment with approximately 80% in-stent restenosis.  There is an inferior branch of this diagonal branch which is occluded and fills via  left to left collaterals.  There is a small ramus branch which has diffuse 90% disease as well as a small OM1 branch of the left circumflex which has diffuse 90% disease.  Within the mid circumflex there is mild to moderate disease and severe disease in  the distal circumflex which is also a small vessel.  The RCA has nonobstructive disease with a widely patent stent in the PL branch.   2.  Severely elevated LVEDP  3.  Severe systemic hypertension, patient was given multiple doses of medications including labetalol and hydralazine to address the blood pressure  4.  Despite premedication for possible allergy, the patient developed severe pruritus of his lower legs, treated with additional Benadryl and steroids.  No other signs of any systemic allergic reaction were present.  5.  Moderate sedation with IV Benadryl, fentanyl, and  Versed for greater than 20 minutes was personally supervised by me with no sedation issues      Recommendations:   1.  Left femoral arterial sheath will be removed when blood pressure has improved  2.  No good targets for PCI identified.  Would recommend continued aggressive risk factor modification medical therapy  3.  Volume removal with hemodialysis in setting of severely elevated LVEDP    Patient Profile  [name] is a 55 y.o. male with severe three-vessel CAD without targets for intervention, ESRD, hypertension, diabetes, hyperlipidemia who was admitted on 09/12/2021 with non-STEMI.  Assessment & Plan   #Non-STEMI -Admitted with chest pain and elevated troponin.  Left heart catheterization at [facility] earlier this month shows diffuse severe three-vessel CAD that is not amendable to PCI.  I did review the records. -Most recent heart catheterization was complicated by embolization and gangrene of his lower extremity toes.  There was discussion yesterday by Dr. Lauree Chandler and his cardiologist at [facility] who did not recommend repeat heart catheterization given low likelihood for disease to be found that could be intervened upon. -Medical management is the best option. -heparin started 09/12/2021 @ 0630. Has received 48 hours. Can stop heparin as he has received 48 hours of this. -Continue aspirin Brilinta. -Antianginal agents include amlodipine 5 mg daily, metoprolol succinate 100 mg daily.  We will add Imdur 30 mg daily. -Allergies listed  to Crestor and Lipitor.  He is on Repatha at home.  Most recent LDL was 50. -Would recommend to observe today on antianginal therapy and if stable can be discharged tomorrow.  [facility] will sign off.   Medication Recommendations: Medical management as above. Other recommendations (labs, testing, etc): None. Follow up as an outpatient: He follows with [facility].  Would recommend he see them in 2 to 4 weeks postdischarge.  For questions or updates, please contact [place] Please consult [email or web address] for contact info under   Time Spent with Patient: I have spent a total of 35 minutes with patient reviewing hospital notes, telemetry, EKGs, labs and examining the patient as well as establishing an assessment and plan that was discussed with the patient.  > 50% of time was spent in direct patient care.    Signed, Addison Naegeli. Audie Box, MD, [place]  09/14/2021 10:51 AM

## 2021-09-14 NOTE — Consult Note (Signed)
WOC Nurse Consult Note: Patient receiving care in [place]. Reason for Consult: Patient presents with wound VAC to left foot. Wound type: Per EMR, surgical amputation of left toes 1 & 2 performed at [facility] 11/13 for wet gangrene. See [facility] note from 09/10/21 by Brynda Greathouse [place] team will see patient Monday 09/16/21 for [facility] care and staff instructions. Val Riles, RN, MSN, CWOCN, CNS-BC, pager [phone]

## 2021-09-14 NOTE — Progress Notes (Signed)
Subjective:  hgb down to 8.8 this AM but he denies bleeding-  hoping to go to HD soon  Objective Vital signs in last 24 hours: Vitals:   09/13/21 2100 09/14/21 0008 09/14/21 0400 09/14/21 0726  BP: (!) 156/91 114/82 118/71 119/60  Pulse:    68  Resp: 13 15 16 19   Temp:  98 F (36.7 C) 98.4 F (36.9 C) 97.9 F (36.6 C)  TempSrc:  Oral Oral Oral  SpO2: 97% 90% 94% 95%  Weight:   85.2 kg   Height:       Weight change: 1.3 kg  Intake/Output Summary (Last 24 hours) at 09/14/2021 1019 Last data filed at 09/14/2021 0400 Gross per 24 hour  Intake 813.52 ml  Output --  Net 813.52 ml    Dialysis Orders: [facility] MWF 4 hrs 180NRe 450/800 2.0K/2.0 Ca UFP3 AVF -No heparin -Mircera 150 mcg IV q 2 weeks (last dose 200 mcg IV 07/31/2021) -Hectorol 5 mcg IV TIW     Assessment/Plan:  Unstable Angina/NSTEMI: H/O  multi vessel CAD. Troponin peak > 5K. Plan initially was to take to CV lab today however seen by Dr. Angelena Form who opts to treat medically. On NTG/Heparin gtt per primary/CARDS.  Osteomyelitis/L toe gangrene. On Vancomycin 1 gram IV TIW cefepime 2 grams with HD until 10/11/2021. Continue Flagyl. Per primary. Notified pharmacy of HD schedule change.   ESRD -  MWF. Next HD 09/14/2021 off schedule. 2.0 K bath.  Will then next be due on Monday to get back on schedule  Hyperkalemia: New issue for patient. Has been on lokelma on non-HD days as OP. Given Loklema 10 grams  on 11/25 but will get HD today   Hypertension/volume  - Volume and BP well controlled. On hydralazine, metoprolol and amlodipine as OP. These have been continued. UF as tolerated. he tells me that when he had his heart cath he was told he had too much volume on board-  may need lower EDW and less BP meds-  can fine tune as OP -  but will take norvasc down to 5 mg today   Anemia  - HBG 10.3. Newly dosed OP ESA hasn't been started yet. Give ESA with HD today. Follow HGB-  decreasing now on heparin drip-  need to watch   Metabolic  bone disease - Issues with hyperphosphatemia as OP. Continue binders (Auryxia 2 tabs PO TID AC), VDRA.  Nutrition - Renal/Carb mod diet. Nepro. DMT2-per primary   [place]: Basic Metabolic Panel: Recent Labs  Lab 09/12/21 0507 09/13/21 0848  NA 136 135  K 4.5 5.0  CL 92* 92*  CO2 29 28  GLUCOSE 276* 116*  BUN 33* 47*  CREATININE 6.75* 9.27*  CALCIUM 9.9 9.2   Liver Function Tests: No results for input(s): AST, ALT, ALKPHOS, BILITOT, PROT, ALBUMIN in the last 168 hours. No results for input(s): LIPASE, AMYLASE in the last 168 hours. No results for input(s): AMMONIA in the last 168 hours. CBC: Recent Labs  Lab 09/12/21 0507 09/13/21 0848 09/14/21 0338  WBC 7.5 6.9 4.6  HGB 10.8* 10.3* 8.8*  HCT 34.2* 31.9* 27.2*  MCV 89.5 89.4 87.7  PLT 356 345 294   Cardiac Enzymes: No results for input(s): CKTOTAL, CKMB, CKMBINDEX, TROPONINI in the last 168 hours. CBG: Recent Labs  Lab 09/13/21 0611 09/13/21 1252 09/13/21 1644 09/13/21 2059 09/14/21 0557  GLUCAP 123* 164* 116* 186* 132*    Iron Studies: No results for input(s): IRON,  TIBC, TRANSFERRIN, FERRITIN in the last 72 hours. Studies/Results: No results found. Medications: Infusions:  sodium chloride     sodium chloride     sodium chloride     sodium chloride     [START ON 09/16/2021] ceFEPime (MAXIPIME) IV     ceFEPime (MAXIPIME) IV     heparin 1,500 Units/hr (09/14/21 1015)   nitroGLYCERIN Stopped (09/14/21 0811)   [START ON 09/16/2021] vancomycin     vancomycin      Scheduled Medications:  amLODipine  10 mg Oral QHS   aspirin EC  81 mg Oral Daily   darbepoetin (ARANESP) injection - DIALYSIS  40 mcg Intravenous Once   doxercalciferol  5 mcg Intravenous Q M,W,F-HD   ferric citrate  420 mg Oral TID WC   hydrALAZINE  50 mg Oral TID   insulin aspart  0-6 Units Subcutaneous TID WC   metoprolol succinate  100 mg Oral QHS   metroNIDAZOLE  500 mg Oral Q12H   sodium chloride flush  3  mL Intravenous Q12H   [START ON 09/15/2021] sodium zirconium cyclosilicate  10 g Oral Once per day on Sun Tue Thu Sat   ticagrelor  90 mg Oral BID   traZODone  150 mg Oral QHS    have reviewed scheduled and prn medications.  Physical Exam: General:  NAD Heart: RRR Lungs: mostly clear Abdomen: soft, non tender Extremities: no edema Dialysis Access: AVF-  patent     09/14/2021,10:19 AM  LOS: 1 day

## 2021-09-15 DIAGNOSIS — I249 Acute ischemic heart disease, unspecified: Secondary | ICD-10-CM | POA: Diagnosis not present

## 2021-09-15 LAB — CBC
HCT: 28.9 % — ABNORMAL LOW (ref 39.0–52.0)
Hemoglobin: 9 g/dL — ABNORMAL LOW (ref 13.0–17.0)
MCH: 28 pg (ref 26.0–34.0)
MCHC: 31.1 g/dL (ref 30.0–36.0)
MCV: 89.8 fL (ref 80.0–100.0)
Platelets: 303 10*3/uL (ref 150–400)
RBC: 3.22 MIL/uL — ABNORMAL LOW (ref 4.22–5.81)
RDW: 17.6 % — ABNORMAL HIGH (ref 11.5–15.5)
WBC: 5.3 10*3/uL (ref 4.0–10.5)
nRBC: 0 % (ref 0.0–0.2)

## 2021-09-15 LAB — BASIC METABOLIC PANEL
Anion gap: 11 (ref 5–15)
BUN: 37 mg/dL — ABNORMAL HIGH (ref 6–20)
CO2: 29 mmol/L (ref 22–32)
Calcium: 8.9 mg/dL (ref 8.9–10.3)
Chloride: 96 mmol/L — ABNORMAL LOW (ref 98–111)
Creatinine, Ser: 8.32 mg/dL — ABNORMAL HIGH (ref 0.61–1.24)
GFR, Estimated: 7 mL/min — ABNORMAL LOW (ref >60)
Glucose, Bld: 156 mg/dL — ABNORMAL HIGH (ref 70–99)
Potassium: 4.3 mmol/L (ref 3.5–5.1)
Sodium: 136 mmol/L (ref 135–145)

## 2021-09-15 LAB — GLUCOSE, CAPILLARY
Glucose-Capillary: 165 mg/dL — ABNORMAL HIGH (ref 70–99)
Glucose-Capillary: 120 mg/dL — ABNORMAL HIGH (ref 70–99)

## 2021-09-15 LAB — HEPARIN LEVEL (UNFRACTIONATED): Heparin Unfractionated: <0.1 IU/mL — ABNORMAL LOW (ref 0.30–0.70)

## 2021-09-15 LAB — HEPATITIS B SURFACE ANTIBODY, QUANTITATIVE: Hep B S AB Quant (Post): 28.8 m[IU]/mL (ref >9.9)

## 2021-09-15 MED ORDER — PROCHLORPERAZINE EDISYLATE 10 MG/2ML IJ SOLN
10.0000 mg | Freq: Four times a day (QID) | INTRAMUSCULAR | Status: DC | PRN
  Administered 2021-09-15: 08:00:00 10 mg via INTRAVENOUS
  Filled 2021-09-15 (×2): qty 2

## 2021-09-15 MED ORDER — ISOSORBIDE MONONITRATE ER 30 MG PO TB24: 30.0000 mg | ORAL_TABLET | Freq: Every day | ORAL | 0 refills | Status: DC

## 2021-09-15 NOTE — Progress Notes (Signed)
Subjective:  HD yest- removed 2 liters tolerated well-  off of nitro and heparin without CP-  going home today   Objective Vital signs in last 24 hours: Vitals:   09/14/21 1520 09/14/21 1614 09/14/21 1958 09/15/21 0515  BP: (!) 143/78 (!) 151/73 116/69 (!) 114/35  Pulse: 79  68 78  Resp: 18 18 16    Temp: 98.3 F (36.8 C)  98.3 F (36.8 C) 98 F (36.7 C)  TempSrc: Oral  Oral Oral  SpO2: 98%  100% 98%  Weight: 84.1 kg   85.8 kg  Height:       Weight change: 1.1 kg  Intake/Output Summary (Last 24 hours) at 09/15/2021 3557 Last data filed at 09/14/2021 2045 Gross per 24 hour  Intake 227.38 ml  Output 2000 ml  Net -1772.62 ml    Dialysis Orders: [facility] MWF 4 hrs 180NRe 450/800 2.0K/2.0 Ca UFP3 AVF -No heparin -Mircera 150 mcg IV q 2 weeks (last dose 200 mcg IV 07/31/2021) -Hectorol 5 mcg IV TIW     Assessment/Plan:  Unstable Angina/NSTEMI: H/O  multi vessel CAD. Troponin peak > 5K. Plan initially was to take to CV lab today however seen by Dr. Angelena Form who opts to treat medically. On NTG/Heparin gtt per primary/CARDS.  Osteomyelitis/L toe gangrene. On Vancomycin 1 gram IV TIW cefepime 2 grams with HD until 10/11/2021. Continue Flagyl. Per primary. Notified pharmacy of HD schedule change.   ESRD -  MWF. Next HD 09/14/2021 off schedule. 2.0 K bath.  Will then next be due on Monday to get back on schedule -  is being discharged today -  will go to OP tomorrow Hyperkalemia:  Has been on lokelma on non-HD days as OP. Given Loklema 10 grams  on 11/25 -  to continue previous reg as OP   Hypertension/volume  - Volume and BP well controlled. On hydralazine, metoprolol and amlodipine as OP. These have been continued. UF as tolerated. he tells me that when he had his heart cath he was told he had too much volume on board-  may need lower EDW and less BP meds-  can fine tune as OP -  but will take norvasc down to 5 mg today   Anemia  - HBG 10.3.   decreasing now on heparin drip-  stable last 24  hours  Metabolic bone disease - Issues with hyperphosphatemia as OP. Continue binders (Auryxia 2 tabs PO TID AC), VDRA.  Nutrition - Renal/Carb mod diet. Nepro. DMT2-per primary   [place]: Basic Metabolic Panel: Recent Labs  Lab 09/13/21 0848 09/14/21 1220 09/15/21 0310  NA 135 134* 136  K 5.0 5.2* 4.3  CL 92* 93* 96*  CO2 28 27 29   GLUCOSE 116* 128* 156*  BUN 47* 66* 37*  CREATININE 9.27* 11.45* 8.32*  CALCIUM 9.2 9.0 8.9  PHOS  --  8.2*  --    Liver Function Tests: Recent Labs  Lab 09/14/21 1220  ALBUMIN 2.7*   No results for input(s): LIPASE, AMYLASE in the last 168 hours. No results for input(s): AMMONIA in the last 168 hours. CBC: Recent Labs  Lab 09/12/21 0507 09/13/21 0848 09/14/21 0338 09/15/21 0310  WBC 7.5 6.9 4.6 5.3  HGB 10.8* 10.3* 8.8* 9.0*  HCT 34.2* 31.9* 27.2* 28.9*  MCV 89.5 89.4 87.7 89.8  PLT 356 345 294 303   Cardiac Enzymes: No results for input(s): CKTOTAL, CKMB, CKMBINDEX, TROPONINI in the last 168 hours. CBG: Recent Labs  Lab  09/14/21 0557 09/14/21 1109 09/14/21 1717 09/14/21 2038 09/15/21 0601  GLUCAP 132* 132* 110* 145* 165*    Iron Studies: No results for input(s): IRON, TIBC, TRANSFERRIN, FERRITIN in the last 72 hours. Studies/Results: No results found. Medications: Infusions:  sodium chloride     sodium chloride     [START ON 09/16/2021] ceFEPime (MAXIPIME) IV     [START ON 09/16/2021] vancomycin      Scheduled Medications:  amLODipine  5 mg Oral QHS   aspirin EC  81 mg Oral Daily   doxercalciferol  5 mcg Intravenous Q M,W,F-HD   ferric citrate  420 mg Oral TID WC   hydrALAZINE  50 mg Oral TID   insulin aspart  0-6 Units Subcutaneous TID WC   isosorbide mononitrate  30 mg Oral Daily   metoprolol succinate  100 mg Oral QHS   metroNIDAZOLE  500 mg Oral Q12H   sodium chloride flush  3 mL Intravenous Q12H   sodium zirconium cyclosilicate  10 g Oral Once per day on Sun Tue Thu Sat    ticagrelor  90 mg Oral BID   traZODone  150 mg Oral QHS    have reviewed scheduled and prn medications.  Physical Exam: General:  NAD Heart: RRR Lungs: mostly clear Abdomen: soft, non tender Extremities: no edema-  toe wound Dialysis Access: AVF-  patent     09/15/2021,9:07 AM  LOS: 2 days

## 2021-09-15 NOTE — Progress Notes (Signed)
Patient discharged: Home with family.  Left via: Wheelchair  Discharge paperwork reviewed and given: to patient and family. Teach back completed. IV and telemetry disconnected. Belongings given to patient.  

## 2021-09-15 NOTE — Discharge Summary (Addendum)
Physician Discharge Summary  [name]:[id number] DOB: January 25, 1966 DOA: 09/12/2021  PCP: [name], MD  Admit date: 09/12/2021 Discharge date: 09/15/2021 30 Day Unplanned Readmission Risk Score    Flowsheet Row ED to Hosp-Admission (Current) from 09/12/2021 in [place] [facility] PCU  30 Day Unplanned Readmission Risk Score (%) 30.76 Filed at 09/15/2021 0801       This score is the patient's risk of an unplanned readmission within 30 days of being discharged (0 -100%). The score is based on dignosis, age, lab data, medications, orders, and past utilization.   Low:  0-14.9   Medium: 15-21.9   High: 22-29.9   Extreme: 30 and above          Admitted From: Home Disposition: Home  Recommendations for Outpatient Follow-up:  Follow up with PCP in 1-2 weeks Please obtain BMP/CBC in one week Follow-up with your primary cardiologist in 1 to 2 weeks Please follow up with your PCP on the following pending results: Unresulted Labs (From admission, onward)     Start     Ordered   09/14/21 0500  Hepatitis B surface antibody,quantitative  Once,   R        09/14/21 0500              Home Health: None Equipment/Devices: None  Discharge Condition: Stable CODE STATUS: Full code Diet recommendation: Low-sodium  Subjective: Seen and examined.  He has no complaints.  No more chest pain since nitro was stopped 24 hours ago.  Had some nausea this morning which resolved with medications.  He is ready to go home.  Brief/Interim Summary: [name] is a 55 y.o. male with medical history significant of PVD s/p left second and first toe amputations;  CAD; DM; ESRD on HD; HTN; and HLD pesented with chest pain.   He was last admitted at [facility] with CP from 11/2-5.  In 09/2020 he had PCI and was noted to have in-stent restenosis of the PCI to the LAD in 03/2021.  He underwent LHC on 08/23/21 with multivessel disease and no targets for PCI noted.  There were no medication changes  made following this hospitalization.   He returned from 11/12-16 with toe gangrene that was thought to be related to embolic event from [facility].  He underwent amputation of the L 1/2 toes and had a wound vac placed and was discharged on triple antibiotic therapy which included cefepime and vancomycin on dialysis days and oral Flagyl and he was instructed to continue those through 10/13/2021.   This time again, he was found to have significantly elevated troponins confirming/ruling in diagnosis of NSTEMI.  He was seen by cardiology.  Further hospitalization course in detail as below.  NSTEMI: Troponin significantly elevated, NSTEMI ruled in.  Patient was started on heparin and nitroglycerin drip. patient has a history of extensive three-vessel CAD and he has had multiple cardiac caths in the past with recent 1 completed just 3 weeks ago at [facility], he follows [facility] cardiology closely. They found diffuse three-vessel disease with no target of PCI.  Cardiology here has discussed with multiple family members of the patient, patient and his cardiologist at [facility] and they have decided not to do any cardiac cath here due to very low chances of finding any target for PCI and they have recommended to continue heparin drip for 48 hours.  He was also on nitroglycerin drip for his chest pain.  His nitroglycerin and heparin drip were stopped after  48 hours, that was yesterday morning.  Cardiology added Imdur 30 mg for angina.  Patient was taking Isordil prior to this which is being discontinued.  Recommended to keep overnight for observation.  Patient has not had any further chest pain since stopping heparin and nitroglycerin and per cardiology recommendation, he is being discharged home today.  He is in full agreement.  He will follow-up with his cardiologist.   ESRD on HD: MWF schedule.  Nephrologist consulted.  He received his dialysis yesterday, he is due again tomorrow.   HTN: Blood pressure controlled, continue  Norvasc, Hydralazine, Toprol XL, as needed hydralazine.   HLD: -Lipids were checked in 09/2020 (132/69/48/73) -Reports prior intolerance to statins -On Repatha as an outpatient   DM: Hemoglobin A1c 6.6.  Does not seem to be taking any oral hypoglycemic agents at home.  Continue SSI, blood sugar controlled.   Prolonged QTc: 492 here.   PAD -s/p remote R TMA and recent L 1/2 toe amputations -Wound vac is in place on L foot.  Continue antibiotics cefepime, vancomycin and Flagyl as recommended by [facility] through 10/13/2021.  These antibiotics were continued here as well.  Wound VAC to be managed by his wound care nurse as outpatient.    Discharge Diagnoses:  Principal Problem:   ACS (acute coronary syndrome) ([place]) Active Problems:   Essential hypertension   ESRD on dialysis ([place])   Type 2 diabetes mellitus ([place])   Dyslipidemia   NSTEMI (non-ST elevated myocardial infarction) ([place])   PAD (peripheral artery disease) ([place])   Prolonged QT interval    Discharge Instructions   Allergies as of 09/15/2021       Reactions   Atorvastatin    Other reaction(s): Muscle Pain   Rosuvastatin    Other reaction(s): Muscle Pain   Amoxicillin Itching   Did it involve swelling of the face/tongue/throat, SOB, or low BP? Unknown Did it involve sudden or severe rash/hives, skin peeling, or any reaction on the inside of your mouth or nose? Unknown Did you need to seek medical attention at a hospital or doctor's office? Unknown When did it last happen?      20 years ago If all above answers are "NO", may proceed with cephalosporin use.   Chlorhexidine Hives   Patient has blistering   Hydromorphone Itching   Patient states he may take with benadryl. Makes feet itch.   Percocet [oxycodone-acetaminophen] Itching, Other (See Comments)   Legs only. Itching and burning feeling.   Morphine    Other reaction(s): rash/itching All narcotics per pt   Gabapentin Nausea And Vomiting   Stomach issues          Medication List     STOP taking these medications    isosorbide dinitrate 20 MG tablet Commonly known as: ISORDIL       TAKE these medications    amLODipine 10 MG tablet Commonly known as: NORVASC Take 10 mg by mouth at bedtime.   aspirin EC 81 MG tablet Take 1 tablet (81 mg total) by mouth daily.   Brilinta 90 MG Tabs tablet Generic drug: ticagrelor TAKE 1 TABLET (90 MG TOTAL) BY MOUTH TWO TIMES DAILY. What changed: how much to take   ethyl chloride spray 1 application See admin instructions. As needed for pain on dialysis days   FreeStyle Libre 14 Day Sensor Misc See admin instructions.   HECTOROL IV Doxercalciferol (Hectorol)   hydrALAZINE 50 MG tablet Commonly known as: APRESOLINE Take 50 mg by mouth 3 (three) times  daily.   isosorbide mononitrate 30 MG 24 hr tablet Commonly known as: IMDUR Take 1 tablet (30 mg total) by mouth daily.   Lokelma 10 g Pack packet Generic drug: sodium zirconium cyclosilicate Take 10 g by mouth See admin instructions. Non dialysis days(Tuesday,Thursday,Saturday and "Sunday)   metoprolol succinate 100 MG 24 hr tablet Commonly known as: TOPROL-XL Take 100 mg by mouth at bedtime.   MIRCERA IJ dialysis   nitroGLYCERIN 0.4 MG SL tablet Commonly known as: NITROSTAT Place 0.4 mg under the tongue every 5 (five) minutes as needed for chest pain.   oxyCODONE 5 MG immediate release tablet Commonly known as: Oxy IR/ROXICODONE Take 5 mg by mouth 4 (four) times daily as needed.   Repatha SureClick 140 MG/ML [place] Generic drug: Evolocumab Inject 140 mg into the skin every 14 (fourteen) days.   sevelamer carbonate 800 MG tablet Commonly known as: RENVELA Take 1,600 mg by mouth 3 (three) times daily with meals.   traZODone 150 MG tablet Commonly known as: DESYREL Take 150 mg by mouth at bedtime.        Follow-up Information     Avbuere, Edwin, MD Follow up in 1 week(s).   Specialty: Internal Medicine Contact  information: [address] [place] [address] [phone]         Skains, Mark C, MD .   Specialty: Cardiology Contact information: [address] [place] [address] [phone]                Allergies  Allergen Reactions   Atorvastatin     Other reaction(s): Muscle Pain   Rosuvastatin     Other reaction(s): Muscle Pain   Amoxicillin Itching    Did it involve swelling of the face/tongue/throat, SOB, or low BP? Unknown Did it involve sudden or severe rash/hives, skin peeling, or any reaction on the inside of your mouth or nose? Unknown Did you need to seek medical attention at a hospital or doctor's office? Unknown When did it last happen?      20"  years ago If all above answers are "NO", may proceed with cephalosporin use.    Chlorhexidine Hives    Patient has blistering   Hydromorphone Itching    Patient states he may take with benadryl. Makes feet itch.   Percocet [Oxycodone-Acetaminophen] Itching and Other (See Comments)    Legs only. Itching and burning feeling.   Morphine     Other reaction(s): rash/itching All narcotics per pt   Gabapentin Nausea And Vomiting    Stomach issues     Consultations: Cardiology   Procedures/Studies: DG Chest 2 View  Result Date: 09/12/2021 CLINICAL DATA:  Chest pain EXAM: CHEST - 2 VIEW COMPARISON:  08/20/2021 FINDINGS: Generous heart size accentuated by low lung volumes. Indistinct perihilar opacity. No pleural fluid or pneumothorax. Right subclavian stenting. IMPRESSION: 1. Indistinct perihilar opacity which could be edema or atelectasis. 2. Chronic cardiomegaly. Electronically Signed   By: Jorje Guild M.D.   On: 09/12/2021 05:36   DG Chest 2 View  Result Date: 08/20/2021 CLINICAL DATA:  Chest pain EXAM: CHEST - 2 VIEW COMPARISON:  02/10/2021 FINDINGS: A vascular stent is identified in the expected location of the right subclavian vein. Normal heart size. No pleural effusion or edema. No  airspace opacities identified. The visualized osseous structures are unremarkable. IMPRESSION: No active cardiopulmonary abnormalities. Electronically Signed   By: Kerby Moors M.D.   On: 08/20/2021 16:35     Discharge Exam: Vitals:  09/14/21 1958 09/15/21 0515  BP: 116/69 (!) 114/35  Pulse: 68 78  Resp: 16   Temp: 98.3 F (36.8 C) 98 F (36.7 C)  SpO2: 100% 98%   Vitals:   09/14/21 1520 09/14/21 1614 09/14/21 1958 09/15/21 0515  BP: (!) 143/78 (!) 151/73 116/69 (!) 114/35  Pulse: 79  68 78  Resp: 18 18 16    Temp: 98.3 F (36.8 C)  98.3 F (36.8 C) 98 F (36.7 C)  TempSrc: Oral  Oral Oral  SpO2: 98%  100% 98%  Weight: 84.1 kg   85.8 kg  Height:        General: Pt is alert, awake, not in acute distress Cardiovascular: RRR, S1/S2 +, no rubs, no gallops Respiratory: CTA bilaterally, no wheezing, no rhonchi Abdominal: Soft, NT, ND, bowel sounds + Extremities: no edema, no cyanosis, wound VAC attached to the left foot.  Amputated great toe and second toe of the left foot.    The results of significant diagnostics from this hospitalization (including imaging, microbiology, ancillary and laboratory) are listed below for reference.     Microbiology: No results found for this or any previous visit (from the past 240 hour(s)).   Labs: BNP (last 3 results) No results for input(s): BNP in the last 8760 hours. Basic Metabolic Panel: Recent Labs  Lab 09/12/21 0507 09/13/21 0848 09/14/21 1220 09/15/21 0310  NA 136 135 134* 136  K 4.5 5.0 5.2* 4.3  CL 92* 92* 93* 96*  CO2 29 28 27 29   GLUCOSE 276* 116* 128* 156*  BUN 33* 47* 66* 37*  CREATININE 6.75* 9.27* 11.45* 8.32*  CALCIUM 9.9 9.2 9.0 8.9  PHOS  --   --  8.2*  --    Liver Function Tests: Recent Labs  Lab 09/14/21 1220  ALBUMIN 2.7*   No results for input(s): LIPASE, AMYLASE in the last 168 hours. No results for input(s): AMMONIA in the last 168 hours. CBC: Recent Labs  Lab 09/12/21 0507 09/13/21 0848  09/14/21 0338 09/15/21 0310  WBC 7.5 6.9 4.6 5.3  HGB 10.8* 10.3* 8.8* 9.0*  HCT 34.2* 31.9* 27.2* 28.9*  MCV 89.5 89.4 87.7 89.8  PLT 356 345 294 303   Cardiac Enzymes: No results for input(s): CKTOTAL, CKMB, CKMBINDEX, TROPONINI in the last 168 hours. BNP: Invalid input(s): POCBNP CBG: Recent Labs  Lab 09/14/21 1109 09/14/21 1717 09/14/21 2038 09/15/21 0601 09/15/21 1150  GLUCAP 132* 110* 145* 165* 120*   D-Dimer No results for input(s): DDIMER in the last 72 hours. Hgb A1c No results for input(s): HGBA1C in the last 72 hours. Lipid Profile No results for input(s): CHOL, HDL, LDLCALC, TRIG, CHOLHDL, LDLDIRECT in the last 72 hours. Thyroid function studies No results for input(s): TSH, T4TOTAL, T3FREE, THYROIDAB in the last 72 hours.  Invalid input(s): FREET3 Anemia work up No results for input(s): VITAMINB12, FOLATE, FERRITIN, TIBC, IRON, RETICCTPCT in the last 72 hours. Urinalysis    Component Value Date/Time   COLORURINE YELLOW 06/06/2011 2300   APPEARANCEUR CLOUDY (A) 06/06/2011 2300   LABSPEC 1.021 06/06/2011 2300   PHURINE 5.0 06/06/2011 2300   GLUCOSEU NEGATIVE 06/06/2011 2300   GLUCOSEU > or = 1000 mg/dL (AA) 05/16/2008 1215   HGBUR SMALL (A) 06/06/2011 2300   HGBUR moderate 11/01/2007 0931   BILIRUBINUR NEGATIVE 06/06/2011 2300   KETONESUR NEGATIVE 06/06/2011 2300   PROTEINUR >300 (A) 06/06/2011 2300   UROBILINOGEN 0.2 06/06/2011 2300   NITRITE NEGATIVE 06/06/2011 2300   LEUKOCYTESUR SMALL (A) 06/06/2011 2300  Sepsis Labs Invalid input(s): PROCALCITONIN,  WBC,  LACTICIDVEN Microbiology No results found for this or any previous visit (from the past 240 hour(s)).   Time coordinating discharge: Over 30 minutes  SIGNED:   Darliss Cheney, MD  [facility] 09/15/2021, 1:18 PM  If 7PM-7AM, please contact night-coverage [email or web address]

## 2021-09-19 DIAGNOSIS — N186 End stage renal disease: Secondary | ICD-10-CM | POA: Diagnosis not present

## 2021-09-19 DIAGNOSIS — E1129 Type 2 diabetes mellitus with other diabetic kidney complication: Secondary | ICD-10-CM | POA: Diagnosis not present

## 2021-09-19 DIAGNOSIS — Z992 Dependence on renal dialysis: Secondary | ICD-10-CM | POA: Diagnosis not present

## 2021-09-20 DIAGNOSIS — I96 Gangrene, not elsewhere classified: Secondary | ICD-10-CM | POA: Diagnosis not present

## 2021-09-20 DIAGNOSIS — N2581 Secondary hyperparathyroidism of renal origin: Secondary | ICD-10-CM | POA: Diagnosis not present

## 2021-09-20 DIAGNOSIS — D631 Anemia in chronic kidney disease: Secondary | ICD-10-CM | POA: Diagnosis not present

## 2021-09-20 DIAGNOSIS — N186 End stage renal disease: Secondary | ICD-10-CM | POA: Diagnosis not present

## 2021-09-20 DIAGNOSIS — Z992 Dependence on renal dialysis: Secondary | ICD-10-CM | POA: Diagnosis not present

## 2021-09-20 DIAGNOSIS — L03116 Cellulitis of left lower limb: Secondary | ICD-10-CM | POA: Diagnosis not present

## 2021-09-24 DIAGNOSIS — I2510 Atherosclerotic heart disease of native coronary artery without angina pectoris: Secondary | ICD-10-CM | POA: Diagnosis not present

## 2021-09-24 DIAGNOSIS — I10 Essential (primary) hypertension: Secondary | ICD-10-CM | POA: Diagnosis not present

## 2021-09-24 DIAGNOSIS — I739 Peripheral vascular disease, unspecified: Secondary | ICD-10-CM | POA: Diagnosis not present

## 2021-09-24 DIAGNOSIS — E1122 Type 2 diabetes mellitus with diabetic chronic kidney disease: Secondary | ICD-10-CM | POA: Diagnosis not present

## 2021-09-24 DIAGNOSIS — Z125 Encounter for screening for malignant neoplasm of prostate: Secondary | ICD-10-CM | POA: Diagnosis not present

## 2021-09-24 DIAGNOSIS — N186 End stage renal disease: Secondary | ICD-10-CM | POA: Diagnosis not present

## 2021-09-25 DIAGNOSIS — Z125 Encounter for screening for malignant neoplasm of prostate: Secondary | ICD-10-CM | POA: Diagnosis not present

## 2021-09-25 DIAGNOSIS — Z0000 Encounter for general adult medical examination without abnormal findings: Secondary | ICD-10-CM | POA: Diagnosis not present

## 2021-09-25 DIAGNOSIS — I2510 Atherosclerotic heart disease of native coronary artery without angina pectoris: Secondary | ICD-10-CM | POA: Diagnosis not present

## 2021-09-27 DIAGNOSIS — K521 Toxic gastroenteritis and colitis: Secondary | ICD-10-CM | POA: Diagnosis not present

## 2021-09-27 DIAGNOSIS — M86472 Chronic osteomyelitis with draining sinus, left ankle and foot: Secondary | ICD-10-CM | POA: Diagnosis not present

## 2021-09-27 DIAGNOSIS — T3695XA Adverse effect of unspecified systemic antibiotic, initial encounter: Secondary | ICD-10-CM | POA: Diagnosis not present

## 2021-09-27 DIAGNOSIS — I96 Gangrene, not elsewhere classified: Secondary | ICD-10-CM | POA: Diagnosis not present

## 2021-09-27 DIAGNOSIS — Z792 Long term (current) use of antibiotics: Secondary | ICD-10-CM | POA: Diagnosis not present

## 2021-10-02 DIAGNOSIS — L03116 Cellulitis of left lower limb: Secondary | ICD-10-CM | POA: Diagnosis not present

## 2021-10-04 DIAGNOSIS — L03116 Cellulitis of left lower limb: Secondary | ICD-10-CM | POA: Diagnosis not present

## 2021-10-05 DIAGNOSIS — Z955 Presence of coronary angioplasty implant and graft: Secondary | ICD-10-CM | POA: Diagnosis not present

## 2021-10-05 DIAGNOSIS — E1151 Type 2 diabetes mellitus with diabetic peripheral angiopathy without gangrene: Secondary | ICD-10-CM | POA: Diagnosis not present

## 2021-10-05 DIAGNOSIS — N186 End stage renal disease: Secondary | ICD-10-CM | POA: Diagnosis not present

## 2021-10-05 DIAGNOSIS — Z9181 History of falling: Secondary | ICD-10-CM | POA: Diagnosis not present

## 2021-10-05 DIAGNOSIS — E782 Mixed hyperlipidemia: Secondary | ICD-10-CM | POA: Diagnosis not present

## 2021-10-05 DIAGNOSIS — I2510 Atherosclerotic heart disease of native coronary artery without angina pectoris: Secondary | ICD-10-CM | POA: Diagnosis not present

## 2021-10-05 DIAGNOSIS — Z89431 Acquired absence of right foot: Secondary | ICD-10-CM | POA: Diagnosis not present

## 2021-10-05 DIAGNOSIS — Z992 Dependence on renal dialysis: Secondary | ICD-10-CM | POA: Diagnosis not present

## 2021-10-05 DIAGNOSIS — D631 Anemia in chronic kidney disease: Secondary | ICD-10-CM | POA: Diagnosis not present

## 2021-10-05 DIAGNOSIS — Z89412 Acquired absence of left great toe: Secondary | ICD-10-CM | POA: Diagnosis not present

## 2021-10-05 DIAGNOSIS — Z4781 Encounter for orthopedic aftercare following surgical amputation: Secondary | ICD-10-CM | POA: Diagnosis not present

## 2021-10-05 DIAGNOSIS — Z89422 Acquired absence of other left toe(s): Secondary | ICD-10-CM | POA: Diagnosis not present

## 2021-10-05 DIAGNOSIS — E039 Hypothyroidism, unspecified: Secondary | ICD-10-CM | POA: Diagnosis not present

## 2021-10-05 DIAGNOSIS — E1122 Type 2 diabetes mellitus with diabetic chronic kidney disease: Secondary | ICD-10-CM | POA: Diagnosis not present

## 2021-10-05 DIAGNOSIS — I120 Hypertensive chronic kidney disease with stage 5 chronic kidney disease or end stage renal disease: Secondary | ICD-10-CM | POA: Diagnosis not present

## 2021-10-05 DIAGNOSIS — Z7982 Long term (current) use of aspirin: Secondary | ICD-10-CM | POA: Diagnosis not present

## 2021-10-08 ENCOUNTER — Other Ambulatory Visit: Payer: Self-pay

## 2021-10-08 DIAGNOSIS — Z4781 Encounter for orthopedic aftercare following surgical amputation: Secondary | ICD-10-CM | POA: Diagnosis not present

## 2021-10-08 DIAGNOSIS — N186 End stage renal disease: Secondary | ICD-10-CM | POA: Diagnosis not present

## 2021-10-08 DIAGNOSIS — E1151 Type 2 diabetes mellitus with diabetic peripheral angiopathy without gangrene: Secondary | ICD-10-CM | POA: Diagnosis not present

## 2021-10-08 DIAGNOSIS — I120 Hypertensive chronic kidney disease with stage 5 chronic kidney disease or end stage renal disease: Secondary | ICD-10-CM | POA: Diagnosis not present

## 2021-10-08 DIAGNOSIS — E1122 Type 2 diabetes mellitus with diabetic chronic kidney disease: Secondary | ICD-10-CM | POA: Diagnosis not present

## 2021-10-08 DIAGNOSIS — I2510 Atherosclerotic heart disease of native coronary artery without angina pectoris: Secondary | ICD-10-CM | POA: Diagnosis not present

## 2021-10-11 DIAGNOSIS — E1151 Type 2 diabetes mellitus with diabetic peripheral angiopathy without gangrene: Secondary | ICD-10-CM | POA: Diagnosis not present

## 2021-10-11 DIAGNOSIS — N186 End stage renal disease: Secondary | ICD-10-CM | POA: Diagnosis not present

## 2021-10-11 DIAGNOSIS — I2510 Atherosclerotic heart disease of native coronary artery without angina pectoris: Secondary | ICD-10-CM | POA: Diagnosis not present

## 2021-10-11 DIAGNOSIS — I120 Hypertensive chronic kidney disease with stage 5 chronic kidney disease or end stage renal disease: Secondary | ICD-10-CM | POA: Diagnosis not present

## 2021-10-11 DIAGNOSIS — Z4781 Encounter for orthopedic aftercare following surgical amputation: Secondary | ICD-10-CM | POA: Diagnosis not present

## 2021-10-11 DIAGNOSIS — E1122 Type 2 diabetes mellitus with diabetic chronic kidney disease: Secondary | ICD-10-CM | POA: Diagnosis not present

## 2021-10-15 ENCOUNTER — Encounter (INDEPENDENT_AMBULATORY_CARE_PROVIDER_SITE_OTHER): Payer: Medicare Other | Admitting: Ophthalmology

## 2021-10-15 DIAGNOSIS — E1122 Type 2 diabetes mellitus with diabetic chronic kidney disease: Secondary | ICD-10-CM | POA: Diagnosis not present

## 2021-10-15 DIAGNOSIS — N186 End stage renal disease: Secondary | ICD-10-CM | POA: Diagnosis not present

## 2021-10-15 DIAGNOSIS — E1151 Type 2 diabetes mellitus with diabetic peripheral angiopathy without gangrene: Secondary | ICD-10-CM | POA: Diagnosis not present

## 2021-10-15 DIAGNOSIS — I2510 Atherosclerotic heart disease of native coronary artery without angina pectoris: Secondary | ICD-10-CM | POA: Diagnosis not present

## 2021-10-15 DIAGNOSIS — I120 Hypertensive chronic kidney disease with stage 5 chronic kidney disease or end stage renal disease: Secondary | ICD-10-CM | POA: Diagnosis not present

## 2021-10-15 DIAGNOSIS — Z4781 Encounter for orthopedic aftercare following surgical amputation: Secondary | ICD-10-CM | POA: Diagnosis not present

## 2021-10-16 DIAGNOSIS — L03116 Cellulitis of left lower limb: Secondary | ICD-10-CM | POA: Diagnosis not present

## 2021-10-17 DIAGNOSIS — L299 Pruritus, unspecified: Secondary | ICD-10-CM | POA: Diagnosis not present

## 2021-10-17 DIAGNOSIS — I739 Peripheral vascular disease, unspecified: Secondary | ICD-10-CM | POA: Diagnosis not present

## 2021-10-17 DIAGNOSIS — T8189XA Other complications of procedures, not elsewhere classified, initial encounter: Secondary | ICD-10-CM | POA: Diagnosis not present

## 2021-10-17 DIAGNOSIS — N250 Renal osteodystrophy: Secondary | ICD-10-CM | POA: Diagnosis not present

## 2021-10-17 DIAGNOSIS — Z89422 Acquired absence of other left toe(s): Secondary | ICD-10-CM | POA: Diagnosis not present

## 2021-10-17 DIAGNOSIS — M86672 Other chronic osteomyelitis, left ankle and foot: Secondary | ICD-10-CM | POA: Diagnosis not present

## 2021-10-17 DIAGNOSIS — I7772 Dissection of iliac artery: Secondary | ICD-10-CM | POA: Diagnosis not present

## 2021-10-17 DIAGNOSIS — G8918 Other acute postprocedural pain: Secondary | ICD-10-CM | POA: Diagnosis not present

## 2021-10-17 DIAGNOSIS — B952 Enterococcus as the cause of diseases classified elsewhere: Secondary | ICD-10-CM | POA: Diagnosis not present

## 2021-10-17 DIAGNOSIS — Z7902 Long term (current) use of antithrombotics/antiplatelets: Secondary | ICD-10-CM | POA: Diagnosis not present

## 2021-10-17 DIAGNOSIS — E1169 Type 2 diabetes mellitus with other specified complication: Secondary | ICD-10-CM | POA: Diagnosis not present

## 2021-10-17 DIAGNOSIS — T402X5A Adverse effect of other opioids, initial encounter: Secondary | ICD-10-CM | POA: Diagnosis not present

## 2021-10-17 DIAGNOSIS — I120 Hypertensive chronic kidney disease with stage 5 chronic kidney disease or end stage renal disease: Secondary | ICD-10-CM | POA: Diagnosis not present

## 2021-10-17 DIAGNOSIS — M86172 Other acute osteomyelitis, left ankle and foot: Secondary | ICD-10-CM | POA: Diagnosis not present

## 2021-10-17 DIAGNOSIS — Z20822 Contact with and (suspected) exposure to covid-19: Secondary | ICD-10-CM | POA: Diagnosis not present

## 2021-10-17 DIAGNOSIS — G8929 Other chronic pain: Secondary | ICD-10-CM | POA: Diagnosis not present

## 2021-10-17 DIAGNOSIS — E1151 Type 2 diabetes mellitus with diabetic peripheral angiopathy without gangrene: Secondary | ICD-10-CM | POA: Diagnosis not present

## 2021-10-17 DIAGNOSIS — E1129 Type 2 diabetes mellitus with other diabetic kidney complication: Secondary | ICD-10-CM | POA: Diagnosis not present

## 2021-10-17 DIAGNOSIS — I70202 Unspecified atherosclerosis of native arteries of extremities, left leg: Secondary | ICD-10-CM | POA: Diagnosis not present

## 2021-10-17 DIAGNOSIS — M8660 Other chronic osteomyelitis, unspecified site: Secondary | ICD-10-CM | POA: Diagnosis not present

## 2021-10-17 DIAGNOSIS — E11628 Type 2 diabetes mellitus with other skin complications: Secondary | ICD-10-CM | POA: Diagnosis not present

## 2021-10-17 DIAGNOSIS — R112 Nausea with vomiting, unspecified: Secondary | ICD-10-CM | POA: Diagnosis not present

## 2021-10-17 DIAGNOSIS — Z992 Dependence on renal dialysis: Secondary | ICD-10-CM | POA: Diagnosis not present

## 2021-10-17 DIAGNOSIS — I70222 Atherosclerosis of native arteries of extremities with rest pain, left leg: Secondary | ICD-10-CM | POA: Diagnosis not present

## 2021-10-17 DIAGNOSIS — E1122 Type 2 diabetes mellitus with diabetic chronic kidney disease: Secondary | ICD-10-CM | POA: Diagnosis not present

## 2021-10-17 DIAGNOSIS — Z89412 Acquired absence of left great toe: Secondary | ICD-10-CM | POA: Diagnosis not present

## 2021-10-17 DIAGNOSIS — E782 Mixed hyperlipidemia: Secondary | ICD-10-CM | POA: Diagnosis not present

## 2021-10-17 DIAGNOSIS — I2510 Atherosclerotic heart disease of native coronary artery without angina pectoris: Secondary | ICD-10-CM | POA: Diagnosis not present

## 2021-10-17 DIAGNOSIS — D631 Anemia in chronic kidney disease: Secondary | ICD-10-CM | POA: Diagnosis not present

## 2021-10-17 DIAGNOSIS — E039 Hypothyroidism, unspecified: Secondary | ICD-10-CM | POA: Diagnosis not present

## 2021-10-17 DIAGNOSIS — M7989 Other specified soft tissue disorders: Secondary | ICD-10-CM | POA: Diagnosis not present

## 2021-10-17 DIAGNOSIS — B957 Other staphylococcus as the cause of diseases classified elsewhere: Secondary | ICD-10-CM | POA: Diagnosis not present

## 2021-10-17 DIAGNOSIS — I071 Rheumatic tricuspid insufficiency: Secondary | ICD-10-CM | POA: Diagnosis not present

## 2021-10-17 DIAGNOSIS — T8789 Other complications of amputation stump: Secondary | ICD-10-CM | POA: Diagnosis not present

## 2021-10-17 DIAGNOSIS — I96 Gangrene, not elsewhere classified: Secondary | ICD-10-CM | POA: Diagnosis not present

## 2021-10-17 DIAGNOSIS — I272 Pulmonary hypertension, unspecified: Secondary | ICD-10-CM | POA: Diagnosis not present

## 2021-10-17 DIAGNOSIS — N186 End stage renal disease: Secondary | ICD-10-CM | POA: Diagnosis not present

## 2021-10-17 DIAGNOSIS — Z7982 Long term (current) use of aspirin: Secondary | ICD-10-CM | POA: Diagnosis not present

## 2021-10-20 DIAGNOSIS — Z992 Dependence on renal dialysis: Secondary | ICD-10-CM | POA: Diagnosis not present

## 2021-10-20 DIAGNOSIS — N186 End stage renal disease: Secondary | ICD-10-CM | POA: Diagnosis not present

## 2021-10-20 DIAGNOSIS — E1129 Type 2 diabetes mellitus with other diabetic kidney complication: Secondary | ICD-10-CM | POA: Diagnosis not present

## 2021-10-20 DIAGNOSIS — T8789 Other complications of amputation stump: Secondary | ICD-10-CM | POA: Diagnosis not present

## 2021-10-20 DIAGNOSIS — I70222 Atherosclerosis of native arteries of extremities with rest pain, left leg: Secondary | ICD-10-CM | POA: Diagnosis not present

## 2021-10-22 DIAGNOSIS — T8789 Other complications of amputation stump: Secondary | ICD-10-CM | POA: Diagnosis not present

## 2021-10-23 DIAGNOSIS — M86672 Other chronic osteomyelitis, left ankle and foot: Secondary | ICD-10-CM | POA: Diagnosis not present

## 2021-10-23 DIAGNOSIS — I70202 Unspecified atherosclerosis of native arteries of extremities, left leg: Secondary | ICD-10-CM | POA: Diagnosis not present

## 2021-10-23 DIAGNOSIS — I96 Gangrene, not elsewhere classified: Secondary | ICD-10-CM | POA: Diagnosis not present

## 2021-10-23 DIAGNOSIS — M8660 Other chronic osteomyelitis, unspecified site: Secondary | ICD-10-CM | POA: Diagnosis not present

## 2021-10-23 DIAGNOSIS — M86172 Other acute osteomyelitis, left ankle and foot: Secondary | ICD-10-CM | POA: Diagnosis not present

## 2021-10-23 DIAGNOSIS — T8789 Other complications of amputation stump: Secondary | ICD-10-CM | POA: Diagnosis not present

## 2021-10-24 ENCOUNTER — Encounter (INDEPENDENT_AMBULATORY_CARE_PROVIDER_SITE_OTHER): Payer: Medicare Other | Admitting: Ophthalmology

## 2021-10-24 DIAGNOSIS — I96 Gangrene, not elsewhere classified: Secondary | ICD-10-CM | POA: Diagnosis not present

## 2021-10-24 DIAGNOSIS — M8660 Other chronic osteomyelitis, unspecified site: Secondary | ICD-10-CM | POA: Diagnosis not present

## 2021-10-24 DIAGNOSIS — M86672 Other chronic osteomyelitis, left ankle and foot: Secondary | ICD-10-CM | POA: Diagnosis not present

## 2021-10-24 DIAGNOSIS — M86172 Other acute osteomyelitis, left ankle and foot: Secondary | ICD-10-CM | POA: Diagnosis not present

## 2021-10-25 DIAGNOSIS — M8660 Other chronic osteomyelitis, unspecified site: Secondary | ICD-10-CM | POA: Diagnosis not present

## 2021-10-25 DIAGNOSIS — M86172 Other acute osteomyelitis, left ankle and foot: Secondary | ICD-10-CM | POA: Diagnosis not present

## 2021-10-25 DIAGNOSIS — I96 Gangrene, not elsewhere classified: Secondary | ICD-10-CM | POA: Diagnosis not present

## 2021-10-25 DIAGNOSIS — M86672 Other chronic osteomyelitis, left ankle and foot: Secondary | ICD-10-CM | POA: Diagnosis not present

## 2021-10-27 DIAGNOSIS — I7772 Dissection of iliac artery: Secondary | ICD-10-CM | POA: Diagnosis not present

## 2021-10-27 DIAGNOSIS — M86672 Other chronic osteomyelitis, left ankle and foot: Secondary | ICD-10-CM | POA: Diagnosis not present

## 2021-10-27 DIAGNOSIS — N186 End stage renal disease: Secondary | ICD-10-CM | POA: Diagnosis not present

## 2021-10-27 DIAGNOSIS — Z20822 Contact with and (suspected) exposure to covid-19: Secondary | ICD-10-CM | POA: Diagnosis not present

## 2021-10-27 DIAGNOSIS — M545 Low back pain, unspecified: Secondary | ICD-10-CM | POA: Diagnosis not present

## 2021-10-27 DIAGNOSIS — Z89422 Acquired absence of other left toe(s): Secondary | ICD-10-CM | POA: Diagnosis not present

## 2021-10-27 DIAGNOSIS — I639 Cerebral infarction, unspecified: Secondary | ICD-10-CM | POA: Diagnosis not present

## 2021-10-27 DIAGNOSIS — Z7982 Long term (current) use of aspirin: Secondary | ICD-10-CM | POA: Diagnosis not present

## 2021-10-27 DIAGNOSIS — M4807 Spinal stenosis, lumbosacral region: Secondary | ICD-10-CM | POA: Diagnosis not present

## 2021-10-27 DIAGNOSIS — I120 Hypertensive chronic kidney disease with stage 5 chronic kidney disease or end stage renal disease: Secondary | ICD-10-CM | POA: Diagnosis not present

## 2021-10-27 DIAGNOSIS — R531 Weakness: Secondary | ICD-10-CM | POA: Diagnosis not present

## 2021-10-27 DIAGNOSIS — G5792 Unspecified mononeuropathy of left lower limb: Secondary | ICD-10-CM | POA: Diagnosis not present

## 2021-10-27 DIAGNOSIS — M4715 Other spondylosis with myelopathy, thoracolumbar region: Secondary | ICD-10-CM | POA: Diagnosis not present

## 2021-10-27 DIAGNOSIS — R112 Nausea with vomiting, unspecified: Secondary | ICD-10-CM | POA: Diagnosis not present

## 2021-10-27 DIAGNOSIS — E1141 Type 2 diabetes mellitus with diabetic mononeuropathy: Secondary | ICD-10-CM | POA: Diagnosis not present

## 2021-10-27 DIAGNOSIS — Z7902 Long term (current) use of antithrombotics/antiplatelets: Secondary | ICD-10-CM | POA: Diagnosis not present

## 2021-10-27 DIAGNOSIS — N250 Renal osteodystrophy: Secondary | ICD-10-CM | POA: Diagnosis not present

## 2021-10-27 DIAGNOSIS — I6503 Occlusion and stenosis of bilateral vertebral arteries: Secondary | ICD-10-CM | POA: Diagnosis not present

## 2021-10-27 DIAGNOSIS — I071 Rheumatic tricuspid insufficiency: Secondary | ICD-10-CM | POA: Diagnosis not present

## 2021-10-27 DIAGNOSIS — S98922A Partial traumatic amputation of left foot, level unspecified, initial encounter: Secondary | ICD-10-CM | POA: Diagnosis not present

## 2021-10-27 DIAGNOSIS — R202 Paresthesia of skin: Secondary | ICD-10-CM | POA: Diagnosis not present

## 2021-10-27 DIAGNOSIS — I272 Pulmonary hypertension, unspecified: Secondary | ICD-10-CM | POA: Diagnosis not present

## 2021-10-27 DIAGNOSIS — M48061 Spinal stenosis, lumbar region without neurogenic claudication: Secondary | ICD-10-CM | POA: Diagnosis not present

## 2021-10-27 DIAGNOSIS — I739 Peripheral vascular disease, unspecified: Secondary | ICD-10-CM | POA: Diagnosis not present

## 2021-10-27 DIAGNOSIS — J811 Chronic pulmonary edema: Secondary | ICD-10-CM | POA: Diagnosis not present

## 2021-10-27 DIAGNOSIS — Z992 Dependence on renal dialysis: Secondary | ICD-10-CM | POA: Diagnosis not present

## 2021-10-27 DIAGNOSIS — E782 Mixed hyperlipidemia: Secondary | ICD-10-CM | POA: Diagnosis not present

## 2021-10-27 DIAGNOSIS — E1122 Type 2 diabetes mellitus with diabetic chronic kidney disease: Secondary | ICD-10-CM | POA: Diagnosis not present

## 2021-10-27 DIAGNOSIS — Z8673 Personal history of transient ischemic attack (TIA), and cerebral infarction without residual deficits: Secondary | ICD-10-CM | POA: Diagnosis not present

## 2021-10-27 DIAGNOSIS — R079 Chest pain, unspecified: Secondary | ICD-10-CM | POA: Diagnosis not present

## 2021-10-27 DIAGNOSIS — Z885 Allergy status to narcotic agent status: Secondary | ICD-10-CM | POA: Diagnosis not present

## 2021-10-27 DIAGNOSIS — I96 Gangrene, not elsewhere classified: Secondary | ICD-10-CM | POA: Diagnosis not present

## 2021-10-27 DIAGNOSIS — Z9861 Coronary angioplasty status: Secondary | ICD-10-CM | POA: Diagnosis not present

## 2021-10-27 DIAGNOSIS — M8660 Other chronic osteomyelitis, unspecified site: Secondary | ICD-10-CM | POA: Diagnosis not present

## 2021-10-27 DIAGNOSIS — I2510 Atherosclerotic heart disease of native coronary artery without angina pectoris: Secondary | ICD-10-CM | POA: Diagnosis not present

## 2021-10-27 DIAGNOSIS — E039 Hypothyroidism, unspecified: Secondary | ICD-10-CM | POA: Diagnosis not present

## 2021-10-27 DIAGNOSIS — D631 Anemia in chronic kidney disease: Secondary | ICD-10-CM | POA: Diagnosis not present

## 2021-10-27 DIAGNOSIS — I7389 Other specified peripheral vascular diseases: Secondary | ICD-10-CM | POA: Diagnosis not present

## 2021-10-28 DIAGNOSIS — I7389 Other specified peripheral vascular diseases: Secondary | ICD-10-CM | POA: Diagnosis not present

## 2021-10-28 DIAGNOSIS — R079 Chest pain, unspecified: Secondary | ICD-10-CM | POA: Diagnosis not present

## 2021-10-28 DIAGNOSIS — Z992 Dependence on renal dialysis: Secondary | ICD-10-CM | POA: Diagnosis not present

## 2021-10-28 DIAGNOSIS — M4807 Spinal stenosis, lumbosacral region: Secondary | ICD-10-CM | POA: Diagnosis not present

## 2021-10-28 DIAGNOSIS — M86672 Other chronic osteomyelitis, left ankle and foot: Secondary | ICD-10-CM | POA: Diagnosis not present

## 2021-10-28 DIAGNOSIS — I739 Peripheral vascular disease, unspecified: Secondary | ICD-10-CM | POA: Diagnosis not present

## 2021-10-28 DIAGNOSIS — G5792 Unspecified mononeuropathy of left lower limb: Secondary | ICD-10-CM | POA: Diagnosis not present

## 2021-10-28 DIAGNOSIS — I2510 Atherosclerotic heart disease of native coronary artery without angina pectoris: Secondary | ICD-10-CM | POA: Diagnosis not present

## 2021-10-28 DIAGNOSIS — N186 End stage renal disease: Secondary | ICD-10-CM | POA: Diagnosis not present

## 2021-10-29 DIAGNOSIS — R079 Chest pain, unspecified: Secondary | ICD-10-CM | POA: Diagnosis not present

## 2021-10-29 DIAGNOSIS — I7389 Other specified peripheral vascular diseases: Secondary | ICD-10-CM | POA: Diagnosis not present

## 2021-10-29 DIAGNOSIS — I96 Gangrene, not elsewhere classified: Secondary | ICD-10-CM | POA: Diagnosis not present

## 2021-10-29 DIAGNOSIS — I2510 Atherosclerotic heart disease of native coronary artery without angina pectoris: Secondary | ICD-10-CM | POA: Diagnosis not present

## 2021-10-29 DIAGNOSIS — M86672 Other chronic osteomyelitis, left ankle and foot: Secondary | ICD-10-CM | POA: Diagnosis not present

## 2021-10-29 DIAGNOSIS — R112 Nausea with vomiting, unspecified: Secondary | ICD-10-CM | POA: Diagnosis not present

## 2021-10-30 DIAGNOSIS — I8392 Asymptomatic varicose veins of left lower extremity: Secondary | ICD-10-CM | POA: Insufficient documentation

## 2021-10-30 DIAGNOSIS — N2581 Secondary hyperparathyroidism of renal origin: Secondary | ICD-10-CM | POA: Diagnosis not present

## 2021-10-30 DIAGNOSIS — D631 Anemia in chronic kidney disease: Secondary | ICD-10-CM | POA: Diagnosis not present

## 2021-10-30 DIAGNOSIS — E1129 Type 2 diabetes mellitus with other diabetic kidney complication: Secondary | ICD-10-CM | POA: Diagnosis not present

## 2021-10-30 DIAGNOSIS — L03116 Cellulitis of left lower limb: Secondary | ICD-10-CM | POA: Diagnosis not present

## 2021-10-30 DIAGNOSIS — I96 Gangrene, not elsewhere classified: Secondary | ICD-10-CM | POA: Diagnosis not present

## 2021-10-30 DIAGNOSIS — Z992 Dependence on renal dialysis: Secondary | ICD-10-CM | POA: Diagnosis not present

## 2021-10-30 DIAGNOSIS — N186 End stage renal disease: Secondary | ICD-10-CM | POA: Diagnosis not present

## 2021-10-30 DIAGNOSIS — M86672 Other chronic osteomyelitis, left ankle and foot: Secondary | ICD-10-CM | POA: Diagnosis not present

## 2021-10-30 DIAGNOSIS — N3040 Irradiation cystitis without hematuria: Secondary | ICD-10-CM | POA: Diagnosis not present

## 2021-10-31 ENCOUNTER — Other Ambulatory Visit: Payer: Self-pay

## 2021-10-31 ENCOUNTER — Encounter: Payer: Medicare Other | Attending: Physician Assistant | Admitting: Physician Assistant

## 2021-10-31 DIAGNOSIS — M86672 Other chronic osteomyelitis, left ankle and foot: Secondary | ICD-10-CM | POA: Diagnosis not present

## 2021-10-31 DIAGNOSIS — I2510 Atherosclerotic heart disease of native coronary artery without angina pectoris: Secondary | ICD-10-CM | POA: Insufficient documentation

## 2021-10-31 DIAGNOSIS — I5042 Chronic combined systolic (congestive) and diastolic (congestive) heart failure: Secondary | ICD-10-CM | POA: Insufficient documentation

## 2021-10-31 DIAGNOSIS — L97524 Non-pressure chronic ulcer of other part of left foot with necrosis of bone: Secondary | ICD-10-CM | POA: Diagnosis not present

## 2021-10-31 DIAGNOSIS — I110 Hypertensive heart disease with heart failure: Secondary | ICD-10-CM | POA: Diagnosis not present

## 2021-10-31 DIAGNOSIS — E11621 Type 2 diabetes mellitus with foot ulcer: Secondary | ICD-10-CM | POA: Diagnosis not present

## 2021-11-01 DIAGNOSIS — Z792 Long term (current) use of antibiotics: Secondary | ICD-10-CM | POA: Diagnosis not present

## 2021-11-01 DIAGNOSIS — L03116 Cellulitis of left lower limb: Secondary | ICD-10-CM | POA: Diagnosis not present

## 2021-11-01 DIAGNOSIS — Z880 Allergy status to penicillin: Secondary | ICD-10-CM | POA: Diagnosis not present

## 2021-11-01 DIAGNOSIS — D631 Anemia in chronic kidney disease: Secondary | ICD-10-CM | POA: Diagnosis not present

## 2021-11-01 DIAGNOSIS — M86472 Chronic osteomyelitis with draining sinus, left ankle and foot: Secondary | ICD-10-CM | POA: Diagnosis not present

## 2021-11-01 DIAGNOSIS — N186 End stage renal disease: Secondary | ICD-10-CM | POA: Diagnosis not present

## 2021-11-01 DIAGNOSIS — Z992 Dependence on renal dialysis: Secondary | ICD-10-CM | POA: Diagnosis not present

## 2021-11-01 DIAGNOSIS — N2581 Secondary hyperparathyroidism of renal origin: Secondary | ICD-10-CM | POA: Diagnosis not present

## 2021-11-01 DIAGNOSIS — I96 Gangrene, not elsewhere classified: Secondary | ICD-10-CM | POA: Diagnosis not present

## 2021-11-01 NOTE — Progress Notes (Signed)
Stahle, [place] ([id number]) Visit Report for 10/31/2021 Abuse/Suicide Risk Screen Details Patient Name: [name], [name] Date of Service: 10/31/2021 10:00 AM Medical Record Number: [id number] Patient Account Number: [id number] Date of Birth/Sex: 05-24-1966 (56 y.o. Male) Treating RN: Donnamarie Poag Primary Care Eldin Bonsell: [name] Other Clinician: Referring Sylas Twombly: Referral, Self Treating Jatoria Kneeland/Extender: [name] in Treatment: 0 Abuse/Suicide Risk Screen Items Answer ABUSE RISK SCREEN: Has anyone close to you tried to hurt or harm you recentlyo No Do you feel uncomfortable with anyone in your familyo No Has anyone forced you do things that you didnot want to doo No Electronic Signature(s) Signed: 11/01/2021 4:02:12 PM By: Donnamarie Poag Entered By: Donnamarie Poag on 10/31/2021 10:46:51 Buechele, Nasim L. ([id number]) -------------------------------------------------------------------------------- Activities of Daily Living Details Patient Name: [name], [name] Date of Service: 10/31/2021 10:00 AM Medical Record Number: [id number] Patient Account Number: [id number] Date of Birth/Sex: February 10, 1966 (56 y.o. Male) Treating RN: Donnamarie Poag Primary Care Shene Maxfield: [name] Other Clinician: Referring Keldric Poyer: Referral, Self Treating Deshanna Kama/Extender: [name] in Treatment: 0 Activities of Daily Living Items Answer Activities of Daily Living (Please select one for each item) Drive Automobile Completely Able Take Medications Completely Able Use Telephone Completely Able Care for Appearance Completely Able Use Toilet Completely Able Bath / Shower Completely Able Dress Self Completely Able Feed Self Completely Able Walk Completely Able Get In / Out Bed Completely Able Housework Completely Able Prepare Meals Completely Able Handle Money Completely Able Shop for Self Completely Able Electronic Signature(s) Signed: 11/01/2021 4:02:12 PM By: Donnamarie Poag Entered By:  Donnamarie Poag on 10/31/2021 10:47:14 Denno, Rosario L. ([id number]) -------------------------------------------------------------------------------- Education Screening Details Patient Name: [name], [name] Date of Service: 10/31/2021 10:00 AM Medical Record Number: [id number] Patient Account Number: [id number] Date of Birth/Sex: 1965/12/21 (56 y.o. Male) Treating RN: Donnamarie Poag Primary Care Nare Gaspari: [name] Other Clinician: Referring Maxtyn Nuzum: Referral, Self Treating Andres Escandon/Extender: [name] in Treatment: 0 Primary Learner Assessed: Patient Learning Preferences/Education Level/Primary Language Learning Preference: Explanation Highest Education Level: College or Above Preferred Language: English Cognitive Barrier Language Barrier: No Translator Needed: No Memory Deficit: No Emotional Barrier: No Cultural/Religious Beliefs Affecting Medical Care: Yes refuses all vaccines of any kind Physical Barrier Impaired Vision: No Impaired Hearing: No Decreased Hand dexterity: No Knowledge/Comprehension Knowledge Level: High Comprehension Level: High Ability to understand written instructions: High Ability to understand verbal instructions: High Motivation Anxiety Level: Calm Cooperation: Cooperative Education Importance: Acknowledges Need Interest in Health Problems: Asks Questions Perception: Coherent Willingness to Engage in Self-Management High Activities: Readiness to Engage in Self-Management High Activities: Electronic Signature(s) Signed: 11/01/2021 4:02:12 PM By: Donnamarie Poag Entered By: Donnamarie Poag on 10/31/2021 10:47:53 Hibbard, Ruso ([id number]) -------------------------------------------------------------------------------- Fall Risk Assessment Details Patient Name: [name], [name] Date of Service: 10/31/2021 10:00 AM Medical Record Number: [id number] Patient Account Number: [id number] Date of Birth/Sex: [birth year] (56 y.o. Male) Treating RN: Donnamarie Poag Primary Care Bralynn Donado: [name] Other Clinician: Referring Yelena Metzer: Referral, Self Treating Beanca Kiester/Extender: [name] in Treatment: 0 Fall Risk Assessment Items Have you had 2 or more falls in the last 12 monthso 0 No Have you had any fall that resulted in injury in the last 12 monthso 0 No FALLS RISK SCREEN History of falling - immediate or within 3 months 0 No Secondary diagnosis (Do you have 2 or more medical diagnoseso) 15 Yes Ambulatory aid None/bed rest/wheelchair/nurse 0 Yes Crutches/cane/walker 15 Yes Furniture 0 No Intravenous therapy Access/Saline/Heparin Lock 0 No Gait/Transferring Normal/ bed rest/ wheelchair 0 No Weak (  short steps with or without shuffle, stooped but able to lift head while walking, may 10 Yes seek support from furniture) Impaired (short steps with shuffle, may have difficulty arising from chair, head down, impaired 0 No balance) Mental Status Oriented to own ability 0 Yes Electronic Signature(s) Signed: 11/01/2021 4:02:12 PM By: Donnamarie Poag Entered By: Donnamarie Poag on 10/31/2021 10:48:26 Woolman, [place] ([id number]) -------------------------------------------------------------------------------- Foot Assessment Details Patient Name: [name], [name] Date of Service: 10/31/2021 10:00 AM Medical Record Number: [id number] Patient Account Number: [id number] Date of Birth/Sex: 1966-02-26 (56 y.o. Male) Treating RN: Donnamarie Poag Primary Care Theoplis Garciagarcia: [name] Other Clinician: Referring Arlynn Stare: Referral, Self Treating Zacharias Ridling/Extender: [name] in Treatment: 0 Foot Assessment Items Site Locations + = Sensation present, - = Sensation absent, C = Callus, U = Ulcer R = Redness, W = Warmth, M = Maceration, PU = Pre-ulcerative lesion F = Fissure, S = Swelling, D = Dryness Assessment Right: Left: Other Deformity: No No Prior Foot Ulcer: Yes Yes Prior Amputation: Yes Yes Charcot Joint: No No Ambulatory Status:  Ambulatory Without Help Gait: Unsteady Electronic Signature(s) Signed: 11/01/2021 4:02:12 PM By: Donnamarie Poag Entered By: Donnamarie Poag on 10/31/2021 10:49:55 Beckel, Apollo ([id number]) -------------------------------------------------------------------------------- Nutrition Risk Screening Details Patient Name: Willenbring, Lucero L. Date of Service: 10/31/2021 10:00 AM Medical Record Number: [id number] Patient Account Number: [id number] Date of Birth/Sex: [birth year] (56 y.o. Male) Treating RN: Donnamarie Poag Primary Care Tsering Leaman: [name] Other Clinician: Referring Sharlot Sturkey: Referral, Self Treating Jamerion Cabello/Extender: [name] in Treatment: 0 Height (in): 70 Weight (lbs): 180.4 Body Mass Index (BMI): 25.9 Nutrition Risk Screening Items Score Screening NUTRITION RISK SCREEN: I have an illness or condition that made me change the kind and/or amount of food I eat 0 No I eat fewer than two meals per day 0 No I eat few fruits and vegetables, or milk products 0 No I have three or more drinks of beer, liquor or wine almost every day 0 No I have tooth or mouth problems that make it hard for me to eat 0 No I don't always have enough money to buy the food I need 0 No I eat alone most of the time 0 No I take three or more different prescribed or over-the-counter drugs a day 1 Yes Without wanting to, I have lost or gained 10 pounds in the last six months 0 No I am not always physically able to shop, cook and/or feed myself 0 No Nutrition Protocols Good Risk Protocol 0 No interventions needed Moderate Risk Protocol High Risk Proctocol Risk Level: Good Risk Score: 1 Electronic Signature(s) Signed: 11/01/2021 4:02:12 PM By: Donnamarie Poag Entered ByDonnamarie Poag on 10/31/2021 10:48:42

## 2021-11-01 NOTE — Progress Notes (Signed)
[place]aczorowski, [place] ([id number]) Visit Report for 10/31/2021 HBO Risk Assessment Details Patient Name: [name], [name] Date of Service: 10/31/2021 10:00 AM Medical Record Number: [id number] Patient Account Number: [id number] Date of Birth/Sex: February 08, 1966 (56 y.o. Male) Treating RN: Donnamarie Poag Primary Care Vedanth Sirico: [name] Other Clinician: Referring Isobel Eisenhuth: Referral, Self Treating Dajahnae Vondra/Extender: [name] in Treatment: 0 HBO Risk Assessment Items Answer Any "Yes" answers must be brought to the hyperbaric physician's attention. Breathing or Lung problemso Yes Describe: CHF Seen a lung doctor for any breathing or lung problemso Yes Currently use tobacco productso No Used tobacco products in the pasto No Heart problemso Yes Seen a doctor for any heart or blood pressure problemso Yes Do you take water pills (diuretic)o No Diabeteso Yes On Diabetes pillo No On Insulino No Dialysiso Yes If yes, date of last dialysis: 10/30/2021 Eye problems like glaucomao No Ear problems or surgeryo No Sinus Problemso No Cancero No Confinement Anxiety (Claustrophobia- fear of confined places)o No Any medical implants/devices that are fully or partially implanted or attached to your bodyo No Pregnanto No Seizureso Yes Date of last known seizure: 09/03/2021 Electronic Signature(s) Signed: 11/01/2021 4:02:12 PM By: Donnamarie Poag Entered ByDonnamarie Poag on 10/31/2021 11:23:06

## 2021-11-01 NOTE — Progress Notes (Signed)
Gloor, [place] ([id number]) Visit Report for 10/31/2021 Allergy List Details Patient Name: [name], [name] Date of Service: 10/31/2021 10:00 AM Medical Record Number: [id number] Patient Account Number: [id number] Date of Birth/Sex: Mar 22, 1966 (56 y.o. Male) Treating RN: [name] Primary Care [name]: [name] Other Clinician: Referring [name]: Referral, Self Treating [name]/Extender: [name] in Treatment: 0 Allergies Active Allergies Dilaudid Percocet morphine gabapentin Lipitor amoxicillin chlorhexidine rosuvastatin Allergy Notes Electronic Signature(s) Signed: 11/01/2021 4:02:12 PM By: [name] Entered By: [name] on 10/31/2021 10:42:22 Buel, [name] ([id number]) -------------------------------------------------------------------------------- Arrival Information Details Patient Name: [name], [name] Date of Service: 10/31/2021 10:00 AM Medical Record Number: [id number] Patient Account Number: [id number] Date of Birth/Sex: 1966/02/15 (56 y.o. Male) Treating RN: [name] Primary Care [name]: [name] Other Clinician: Referring Nayzeth Altman: Referral, Self Treating Anner Baity/Extender: [name] in Treatment: 0 Visit Information Patient Arrived: Wheel Chair Arrival Time: 10:33 Accompanied By: self Transfer Assistance: None Patient Identification Verified: Yes Secondary Verification Process Completed: Yes Patient Requires Transmission-Based No Precautions: Patient Has Alerts: Yes Patient Alerts: Patient on Blood Thinner DIALYSIS RIGHT ARM Electronic Signature(s) Signed: 11/01/2021 4:02:12 PM By: [name] Entered By: [name] on 10/31/2021 10:39:26 [name] ([id number]) -------------------------------------------------------------------------------- Clinic Level of Care Assessment Details Patient Name: [name], [name] Date of Service: 10/31/2021 10:00 AM Medical Record Number: [id number] Patient Account Number:  [id number] Date of Birth/Sex: Jan 29, 1966 (56 y.o. Male) Treating RN: [name] Primary Care [name]: [name] Other Clinician: Referring [name]: Referral, Self Treating [name]/Extender: [name] in Treatment: 0 Clinic Level of Care Assessment Items TOOL 2 Quantity Score []  - Use when only an EandM is performed on the INITIAL visit 0 ASSESSMENTS - Nursing Assessment / Reassessment X - General Physical Exam (combine w/ comprehensive assessment (listed just below) when performed on new 1 20 pt. evals) X- 1 25 Comprehensive Assessment (HX, ROS, Risk Assessments, Wounds Hx, etc.) ASSESSMENTS - Wound and Skin Assessment / Reassessment []  - Simple Wound Assessment / Reassessment - one wound 0 []  - 0 Complex Wound Assessment / Reassessment - multiple wounds []  - 0 Dermatologic / Skin Assessment (not related to wound area) ASSESSMENTS - Ostomy and/or Continence Assessment and Care []  - Incontinence Assessment and Management 0 []  - 0 Ostomy Care Assessment and Management (repouching, etc.) PROCESS - Coordination of Care X - Simple Patient / Family Education for ongoing care 1 15 []  - 0 Complex (extensive) Patient / Family Education for ongoing care []  - 0 Staff obtains Programmer, systems, Records, Test Results / Process Orders X- 1 10 Staff telephones HHA, Nursing Homes / Clarify orders / etc []  - 0 Routine Transfer to another Facility (non-emergent condition) []  - 0 Routine Hospital Admission (non-emergent condition) []  - 0 New Admissions / Biomedical engineer / Ordering NPWT, Apligraf, etc. []  - 0 Emergency Hospital Admission (emergent condition) X- 1 10 Simple Discharge Coordination []  - 0 Complex (extensive) Discharge Coordination PROCESS - Special Needs []  - Pediatric / Minor Patient Management 0 []  - 0 Isolation Patient Management []  - 0 Hearing / Language / Visual special needs []  - 0 Assessment of Community assistance (transportation, D/C planning,  etc.) []  - 0 Additional assistance / Altered mentation []  - 0 Support Surface(s) Assessment (bed, cushion, seat, etc.) INTERVENTIONS - Wound Cleansing / Measurement []  - Wound Imaging (photographs - any number of wounds) 0 []  - 0 Wound Tracing (instead of photographs) []  - 0 Simple Wound Measurement - one wound []  - 0 Complex Wound Measurement - multiple  wounds [name], [name] ([id number]) []  - 0 Simple Wound Cleansing - one wound []  - 0 Complex Wound Cleansing - multiple wounds INTERVENTIONS - Wound Dressings []  - Small Wound Dressing one or multiple wounds 0 []  - 0 Medium Wound Dressing one or multiple wounds []  - 0 Large Wound Dressing one or multiple wounds []  - 0 Application of Medications - injection INTERVENTIONS - Miscellaneous []  - External ear exam 0 []  - 0 Specimen Collection (cultures, biopsies, blood, body fluids, etc.) []  - 0 Specimen(s) / Culture(s) sent or taken to Lab for analysis []  - 0 Patient Transfer (multiple staff / Civil Service fast streamer / Similar devices) []  - 0 Simple Staple / Suture removal (25 or less) []  - 0 Complex Staple / Suture removal (26 or more) []  - 0 Hypo / Hyperglycemic Management (close monitor of Blood Glucose) []  - 0 Ankle / Brachial Index (ABI) - do not check if billed separately Has the patient been seen at the hospital within the last three years: Yes Total Score: 80 Level Of Care: New/Established - Level 3 Electronic Signature(s) Signed: 11/01/2021 4:02:12 PM By: [name] Entered By: [name] on 10/31/2021 11:20:22 [name], [name] ([id number]) -------------------------------------------------------------------------------- Encounter Discharge Information Details Patient Name: [name], [name] Date of Service: 10/31/2021 10:00 AM Medical Record Number: [id number] Patient Account Number: [id number] Date of Birth/Sex: Nov 15, 1965 (56 y.o. Male) Treating RN: [name] Primary Care [name]: [name] Other  Clinician: Referring [name]: Referral, Self Treating [name]/Extender: [name] in Treatment: 0 Encounter Discharge Information Items Discharge Condition: Stable Ambulatory Status: Wheelchair Discharge Destination: Home Transportation: Private Auto Accompanied By: self Schedule Follow-up Appointment: Yes Clinical Summary of Care: Electronic Signature(s) Signed: 11/01/2021 4:02:12 PM By: [name] Entered By: [name] on 10/31/2021 11:21:22 [name], [name] ([id number]) -------------------------------------------------------------------------------- Lower Extremity Assessment Details Patient Name: [name], [name] Date of Service: 10/31/2021 10:00 AM Medical Record Number: [id number] Patient Account Number: [id number] Date of Birth/Sex: 08/08/1966 (56 y.o. Male) Treating RN: [name] Primary Care Nakita Santerre: [name] Other Clinician: Referring Sapphira Harjo: Referral, Self Treating Harbor Vanover/Extender: [name] in Treatment: 0 Edema Assessment Assessed: [Left: No] [Right: No] Edema: [Left: No] [Right: No] Notes wound attached to left foot toes and handled by vascular and [facility] Electronic Signature(s) Signed: 11/01/2021 4:02:12 PM By: [name] Entered By: [name] on 10/31/2021 10:49:43 [name], [name] -------------------------------------------------------------------------------- Multi Wound Chart Details Patient Name: [name], [name] Date of Service: 10/31/2021 10:00 AM Medical Record Number: [id number] Patient Account Number: [id number] Date of Birth/Sex: December 15, 1965 (56 y.o. Male) Treating RN: [name] Primary Care Justyn Boyson: [name] Other Clinician: Referring Aarilyn Dye: Referral, Self Treating Helyne Genther/Extender: [name] in Treatment: 0 Vital Signs Height(in): 70 Pulse(bpm): 87 Weight(lbs): 180.4 Blood Pressure(mmHg): 154/84 Body Mass Index(BMI): 26 Temperature(F): 98.5 Respiratory Rate(breaths/min):  16 Wound Assessments Treatment Notes Notes unable to assess left foot wound as handled by vascular Junice Fei and [facility] for wound care Electronic Signature(s) Signed: 11/01/2021 4:02:12 PM By: [name] Entered By: [name] on 10/31/2021 11:15:49 [name], [name] ([id number]) -------------------------------------------------------------------------------- [place] Details Patient Name: [name], [name] GODMAN. Date of Service: 10/31/2021 10:00 AM Medical Record Number: [id number] Patient Account Number: [id number] Date of Birth/Sex: Feb 16, 1966 (56 y.o. Male) Treating RN: [name] Primary Care Asher Babilonia: [name] Other Clinician: Referring Andyn Sales: Referral, Self Treating Jahki Witham/Extender: [name] in Treatment: 0 Active Inactive HBO Nursing Diagnoses: Anxiety related to feelings of confinement associated with the hyperbaric oxygen chamber Anxiety related to knowledge deficit  of hyperbaric oxygen therapy and treatment procedures Discomfort related to temperature and humidity changes inside hyperbaric chamber Potential for barotraumas to ears, sinuses, teeth, and lungs or cerebral gas embolism related to changes in atmospheric pressure inside hyperbaric oxygen chamber Potential for oxygen toxicity seizures related to delivery of 100% oxygen at an increased atmospheric pressure Potential for pulmonary oxygen toxicity related to delivery of 100% oxygen at an increased atmospheric pressure Goals: Barotrauma will be prevented during HBO2 Date Initiated: 10/31/2021 Target Resolution Date: 12/06/2021 Goal Status: Active Patient and/or family will be able to state/discuss factors appropriate to the management of their disease process during treatment Date Initiated: 10/31/2021 Target Resolution Date: 11/15/2021 Goal Status: Active Patient will tolerate the hyperbaric oxygen therapy treatment Date Initiated: 10/31/2021 Target Resolution Date: 12/06/2021 Goal  Status: Active Patient will tolerate the internal climate of the chamber Date Initiated: 10/31/2021 Target Resolution Date: 11/29/2021 Goal Status: Active Patient/caregiver will verbalize understanding of HBO goals, rationale, procedures and potential hazards Date Initiated: 10/31/2021 Target Resolution Date: 11/19/2021 Goal Status: Active Signs and symptoms of pulmonary oxygen toxicity will be recognized and promptly addressed Date Initiated: 10/31/2021 Target Resolution Date: 12/13/2021 Goal Status: Active Signs and symptoms of seizure will be recognized and promptly addressed ; seizing patients will suffer no harm Date Initiated: 10/31/2021 Target Resolution Date: 12/20/2021 Goal Status: Active Interventions: Administer a five (5) minute air break for patient if signs and symptoms of seizure appear and notify the hyperbaric physician Administer a ten (10) minute air break for patient if signs and symptoms of seizure appear and notify the hyperbaric physician Administer the correct therapeutic gas delivery based on the patients needs and limitations, per physician order Assess and provide for patientos comfort related to the hyperbaric environment and equalization of middle ear Assess for signs and symptoms related to adverse events, including but not limited to confinement anxiety, pneumothorax, oxygen toxicity and baurotrauma Assess patient for any history of confinement anxiety Assess patient's knowledge and expectations regarding hyperbaric medicine and provide education related to the hyperbaric environment, goals of treatment and prevention of adverse events Notes: Orientation to the Wound Care Program Nursing Diagnoses: Knowledge deficit related to the wound healing center program Markey, Niv L. ([id number]) Goals: Patient/caregiver will verbalize understanding of the [place] Date Initiated: 10/31/2021 Target Resolution Date: 11/19/2021 Goal Status:  Active Interventions: Provide education on orientation to the wound center Notes: Electronic Signature(s) Signed: 11/01/2021 4:02:12 PM By: [name] Entered By: [name] on 10/31/2021 11:15:07 [place], [place] ([id number]) -------------------------------------------------------------------------------- Pain Assessment Details Patient Name: [name], [name] Date of Service: 10/31/2021 10:00 AM Medical Record Number: [id number] Patient Account Number: [id number] Date of Birth/Sex: May 06, 1966 (56 y.o. Male) Treating RN: [name] Primary Care Alias Villagran: [name] Other Clinician: Referring Floyd Wade: Referral, Self Treating Jordin Vicencio/Extender: [name] in Treatment: 0 Active Problems Location of Pain Severity and Description of Pain Patient Has Paino Patient Unable to Respond Site Locations Rate the pain. Current Pain Level: 0 Pain Management and Medication Current Pain Management: Notes NONE AT THIS TIME Electronic Signature(s) Signed: 11/01/2021 4:02:12 PM By: [name] Entered By: [name] on 10/31/2021 10:39:44 Tomlin, Kei L. ([id number]) -------------------------------------------------------------------------------- Patient/Caregiver Education Details Patient Name: [name], [name] Date of Service: 10/31/2021 10:00 AM Medical Record Number: [id number] Patient Account Number: [id number] Date of Birth/Gender: [birth year] (56 y.o. Male) Treating RN: [name] Primary Care Physician: [name] Other Clinician: Referring Physician: Referral, Self Treating Physician/Extender: [name] in Treatment: 0 Education Assessment Education Provided To:  Patient Education Topics Provided Elevated Blood Sugar/ Impact on Healing: Hyperbaric Oxygenation: Nutrition: [facility]: Electronic Signature(s) Signed: 11/01/2021 4:02:12 PM By: [name] Entered By: [name] on 10/31/2021 11:20:46 [name], [name]  ([id number]) -------------------------------------------------------------------------------- Vitals Details Patient Name: Genet, Herve L. Date of Service: 10/31/2021 10:00 AM Medical Record Number: [id number] Patient Account Number: [id number] Date of Birth/Sex: 25-Dec-1965 (56 y.o. Male) Treating RN: [name] Primary Care Jaquel Glassburn: [name] Other Clinician: Referring Zedrick Springsteen: Referral, Self Treating Danna Sewell/Extender: [name] in Treatment: 0 Vital Signs Time Taken: 10:39 Temperature (F): 98.5 Height (in): 70 Pulse (bpm): 87 Source: Stated Respiratory Rate (breaths/min): 16 Weight (lbs): 180.4 Blood Pressure (mmHg): 154/84 Source: Stated Reference Range: 80 - 120 mg / dl Body Mass Index (BMI): 25.9 Notes dialysis weight reported 10/30/21 Electronic Signature(s) Signed: 11/01/2021 4:02:12 PM By: [name] Entered ByDonnamarie [name] on 10/31/2021 10:40:54

## 2021-11-01 NOTE — Progress Notes (Signed)
[place], [place] ([id number]) Visit Report for 10/31/2021 Chief Complaint Document Details Patient Name: [name], [name]. Date of Service: 10/31/2021 10:00 AM Medical Record Number: [id number] Patient Account Number: [id number] Date of Birth/Sex: [birth year] (56 y.o. Male) Treating RN: Donnamarie Poag Primary Care Provider: [name] Other Clinician: Referring Provider: Referral, Self Treating Provider/Extender: [name] in Treatment: 0 Information Obtained from: Patient Chief Complaint HBO evaluation for treatment on the left foot diabetic ulcer with osteomyelitis. We are seeing him for HBO only no wound care currently. Electronic Signature(s) Signed: 10/31/2021 6:19:46 PM By: Worthy Keeler PA-C Previous Signature: 10/31/2021 11:11:59 AM Version By: Worthy Keeler PA-C Entered By: Worthy Keeler on 10/31/2021 18:19:45 [name], [name] ([id number]) -------------------------------------------------------------------------------- HPI Details Patient Name: [name], [name] Date of Service: 10/31/2021 10:00 AM Medical Record Number: [id number] Patient Account Number: [id number] Date of Birth/Sex: [birth year] (56 y.o. Male) Treating RN: Donnamarie Poag Primary Care Provider: [name] Other Clinician: Referring Provider: Referral, Self Treating Provider/Extender: [name] in Treatment: 0 History of Present Illness HPI Description: 10/31/2021 patient presents today for evaluation here in our clinic. He is actually being evaluated for hyperbaric oxygen therapy only. He has been seen at [facility] up to this point he is under the care of the vascular surgery specialty. Subsequently he is also under the care of the hyperbaric center there. With that being said that due to the fact that he is a dialysis patient he was actually recommended to be transferred to [place] for his treatments he actually lives in [place] which is where his dialysis is. It was a hardship for him to try to  get from [place] especially on dialysis days all the way to [facility] get into the facility for his treatment and get back home he was literally spending all day long. He has had just 4 treatments there at [facility] thus far based on what I see in these have not been continued while he awaits approval here in our clinic. Subsequently I do have records for review that will be included in the HPI predetermination review and attached to this chart as well. I will not duplicate that here. 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 Nonetheless he continues to have exposed bone with marked soft tissue swelling compatible with osteomyelitis. This is due to the irregularity of the head of the metatarsal as well. Nonetheless along with having associated cellulitis he is also good to be seen by infectious disease and is currently on antibiotics for this as well. Again that will be detailed in the pretreatment review section which I will attach to this note as well. He does currently have a wound VAC in place and again the wound was not evaluated here in the clinic as that is being completely managed by home health and [facility]. The patient does have a history of diabetes mellitus type 2, hypertension, coronary artery disease, and congestive heart failure. He also has cataracts of both eyes but no evidence of glaucoma on his most recent eye exam. Electronic Signature(s) Signed: 10/31/2021 6:20:51 PM By: Worthy Keeler PA-C Previous Signature: 10/31/2021 5:22:41 PM Version By: Worthy Keeler PA-C Entered By: Worthy Keeler on 10/31/2021 18:20:51 [name], [name] ([id number]) -------------------------------------------------------------------------------- Physical Exam Details Patient Name: [name], [name] Date of Service: 10/31/2021 10:00 AM Medical  Record Number: [id number] Patient Account Number: [id number] Date of Birth/Sex: 1966/09/28 (56 y.o. Male) Treating RN: Donnamarie Poag Primary Care Provider: [name] Other Clinician: Referring Provider: Referral, Self Treating Provider/Extender: [name] in Treatment: 0 Constitutional patient is hypertensive.. pulse regular and within target range for patient.Marland Kitchen respirations regular, non-labored and within target range for patient.Marland Kitchen temperature within target range for patient.. Well-nourished and well-hydrated in no acute distress. Eyes conjunctiva clear no eyelid edema noted. pupils equal round and reactive to light and accommodation. Ears, Nose, Mouth, and Throat no gross abnormality of ear auricles or external auditory canals. normal hearing noted during conversation. mucus membranes moist. Respiratory normal breathing without difficulty. Musculoskeletal Patient is currently wearing a front offloading shoe to help with pressure relief and wound healing.Marland Kitchen Psychiatric this patient is able to make decisions and demonstrates good insight into disease process. Alert and Oriented x 3. pleasant and cooperative. 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 This has been very refractory and hard to clear he is actually still on IV antibiotics as well based on chart review. Electronic Signature(s) Signed: 10/31/2021 6:21:27 PM By: Worthy Keeler PA-C Previous Signature: 10/31/2021 5:24:04 PM Version By: Worthy Keeler PA-C Entered By: Worthy Keeler on 10/31/2021 18:21:27 [name], [name] ([id number]) -------------------------------------------------------------------------------- Physician Orders Details Patient Name: [name], [name] Date of Service: 10/31/2021 10:00 AM Medical Record Number: [id number] Patient Account  Number: [id number] Date of Birth/Sex: 25-Jul-1966 (56 y.o. Male) Treating RN: Donnamarie Poag Primary Care Provider: [name] Other Clinician: Referring Provider: Referral, Self Treating Provider/Extender: [name] in Treatment: 0 Verbal / Phone Orders: No Diagnosis Coding ICD-10 Coding Code Description E11.621 Type 2 diabetes mellitus with foot ulcer 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e M86.672 Other chronic osteomyelitis, left ankle and foot I10 Essential (primary) hypertension I25.10 Atherosclerotic heart disease of native coronary artery without angina pectoris I50.42 Chronic combined systolic (congestive) and diastolic (congestive) heart failure Follow-up Appointments o Return Appointment in: - [place]: - WELL CARE handles left foot wound vac and VASCULAR provider handles left foot Additional Orders / Instructions o Follow Nutritious Diet and Increase Protein Intake o Other: - Follow vascular, nephrology and PCP for wound care and other needs Dialysis appt as scheduled M-W-F Hyperbaric Oxygen Therapy o Evaluate for HBO Therapy o Indication and location: - 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 o Follow Hyperbaric Oxygen Glycemia Protocol GLYCEMIA INTERVENTIONS PROTOCOL PRE-HBO GLYCEMIA INTERVENTIONS ACTION INTERVENTION Obtain pre-HBO capillary blood glucose (ensure 1 physician order is in chart). A. Notify HBO physician and await physician orders. 2 If result is 70 mg/dl or below: B. If the result meets the hospital definition of a  critical result, follow hospital policy. A. Give patient an 8 ounce Glucerna Shake, an 8 ounce Ensure, or 8 ounces of a Glucerna/Ensure equivalent dietary supplement*. B. Wait 30 minutes. If result is 71 mg/dl to 130 mg/dl: C. Retest patientos capillary blood glucose (CBG). D. If  result greater than or equal to 110 mg/dl, proceed with HBO. If result less than 110 mg/dl, notify HBO physician and consider holding HBO. If result is 131 mg/dl to 249 mg/dl: A. Proceed with HBO. A. Notify HBO physician and await physician orders. B. It is recommended to hold HBO and do If result is 250 mg/dl or greater: blood/urine ketone testing. C. If the result meets the hospital definition of a critical result, follow hospital policy. Schreurs, [place] ([id number]) POST-HBO GLYCEMIA INTERVENTIONS ACTION INTERVENTION Obtain post HBO capillary blood glucose (ensure 1 physician order is in chart). A. Notify HBO physician and await physician orders. 2 If result is 70 mg/dl or below: B. If the result meets the hospital definition of a critical result, follow hospital policy. A. Give patient an 8 ounce Glucerna Shake, an 8 ounce Ensure, or 8 ounces of a Glucerna/Ensure equivalent dietary supplement*. B. Wait 15 minutes for symptoms of hypoglycemia (i.e. nervousness, anxiety, If result is 71 mg/dl to 100 mg/dl: sweating, chills, clamminess, irritability, confusion, tachycardia or dizziness). C. If patient asymptomatic, discharge patient. If patient symptomatic, repeat capillary blood glucose (CBG) and notify HBO physician. If result is 101 mg/dl to 249 mg/dl: A. Discharge patient. A. Notify HBO physician and await physician orders. B. It is recommended to do blood/urine If result is 250 mg/dl or greater: ketone testing. C. If the result meets the hospital definition of a critical result, follow hospital policy. *Juice or candies are NOT equivalent products. If patient refuses the  Glucerna or Ensure, please consult the hospital dietitian for an appropriate substitute. Electronic Signature(s) Signed: 11/01/2021 9:05:45 AM By: Worthy Keeler PA-C Entered By: Worthy Keeler on 10/31/2021 18:24:01 Mcphearson, [name] ([id number]) -------------------------------------------------------------------------------- Problem List Details Patient Name: Garrabrant, [name] Date of Service: 10/31/2021 10:00 AM Medical Record Number: [id number] Patient Account Number: [id number] Date of Birth/Sex: 03-May-1966 (56 y.o. 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 of 10/31/2021 No Yes bone U13.244 Other chronic osteomyelitis, left ankle and foot 10/31/2021 No Yes I10 Essential (primary) hypertension 10/31/2021 No Yes I25.10 Atherosclerotic heart disease of native coronary artery without angina 10/31/2021 No Yes pectoris I50.42 Chronic combined systolic (congestive) and diastolic (congestive) heart 10/31/2021 No Yes failure Inactive Problems Resolved Problems Electronic Signature(s) Signed: 10/31/2021 11:10:55 AM By: Worthy Keeler PA-C Entered By: Worthy Keeler on 10/31/2021 11:10:55 Griffin, [name] ([id number]) -------------------------------------------------------------------------------- Progress Note Details Patient Name: Jarvis, [name] Date of Service: 10/31/2021 10:00 AM Medical Record Number: [id number] Patient Account Number: [id number] Date of Birth/Sex: [birth year] (56 y.o. Male) Treating RN: Donnamarie Poag Primary Care Provider: [name] Other Clinician: Referring Provider: Referral, Self Treating Provider/Extender:  [name] in Treatment: 0 Subjective Chief Complaint Information obtained from Patient HBO evaluation for treatment on the left foot diabetic ulcer with osteomyelitis. We are seeing him for HBO only no wound care currently. History of Present Illness (HPI) 10/31/2021 patient presents today for evaluation here in our clinic. He is actually being evaluated for hyperbaric oxygen therapy only. He has been seen at [facility] up to this point he is under the care of the vascular surgery specialty. Subsequently he is also under the care of the hyperbaric center there.  With that being said that due to the fact that he is a dialysis patient he was actually recommended to be transferred to [place] for his treatments he actually lives in [place] which is where his dialysis is. It was a hardship for him to try to get from [place] especially on dialysis days all the way to [facility] get into the facility for his treatment and get back home he was literally spending all day long. He has had just 4 treatments there at [facility] thus far based on what I see in these have not been continued while he awaits approval here in our clinic. Subsequently I do have records for review that will be included in the HPI predetermination review and attached to this chart as well. I will not duplicate that here. 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 Nonetheless he continues to have exposed bone with marked soft tissue swelling compatible with osteomyelitis. This is due to the irregularity of the head of the metatarsal as well. Nonetheless along with having associated cellulitis he is also good to be seen by infectious disease and is currently on antibiotics for this as well. Again that will be detailed in the pretreatment review section which I will attach to this  note as well. He does currently have a wound VAC in place and again the wound was not evaluated here in the clinic as that is being completely managed by home health and [facility]. The patient does have a history of diabetes mellitus type 2, hypertension, coronary artery disease, and congestive heart failure. He also has cataracts of both eyes but no evidence of glaucoma on his most recent eye exam. Patient History Information obtained from Patient. Allergies Dilaudid, Percocet, morphine, gabapentin, Lipitor, amoxicillin, chlorhexidine, rosuvastatin Social History Never smoker, Marital Status - Married, Alcohol Use - Never, Drug Use - No History, Caffeine Use - Daily. Medical History Eyes Patient has history of Cataracts - hx Hematologic/Lymphatic Patient has history of Anemia Respiratory Patient has history of Sleep Apnea - not sure he said Cardiovascular Patient has history of Arrhythmia, Congestive Heart Failure, Coronary Artery Disease, Hypertension, Myocardial Infarction - hx Endocrine Patient has history of Type II Diabetes - dx noted Neurologic Patient has history of Neuropathy, Seizure Disorder - hx 2022 Patient is treated with Controlled Diet. Review of Systems (ROS) Constitutional Symptoms (General Health) Denies complaints or symptoms of Fatigue, Fever, Chills, Marked Weight Change. Ear/Nose/Mouth/Throat Denies complaints or symptoms of Difficult clearing ears, Sinusitis. Hematologic/Lymphatic Denies complaints or symptoms of Bleeding / Clotting Disorders, Human Immunodeficiency Virus. Respiratory Complains or has symptoms of Shortness of Breath. Cardiovascular Denies complaints or symptoms of LE edema, stents Gastrointestinal Denies complaints or symptoms of Frequent diarrhea, Nausea, Vomiting. Endocrine Denies complaints or symptoms of Hepatitis, Thyroid disease, Polydypsia (Excessive Thirst). Genitourinary [name], [name] ([id number]) Complains or has  symptoms of Kidney failure/ Dialysis - since 2012. Immunological Denies complaints or symptoms of Hives, Itching. Integumentary (Skin) Complains or has symptoms of Wounds - hx wound R foot and current L foot. Musculoskeletal Denies complaints or symptoms of Muscle Pain, Muscle Weakness, hx 2021 all toes amputated right foot; stated no blood flow Psychiatric Denies complaints or symptoms of Anxiety, Claustrophobia. Objective Constitutional patient is hypertensive.. pulse regular and within target range for patient.Marland Kitchen respirations regular, non-labored and within target range for patient.Marland Kitchen temperature within  target range for patient.. Well-nourished and well-hydrated in no acute distress. Vitals Time Taken: 10:39 AM, Height: 70 in, Source: Stated, Weight: 180.4 lbs, Source: Stated, BMI: 25.9, Temperature: 98.5 F, Pulse: 87 bpm, Respiratory Rate: 16 breaths/min, Blood Pressure: 154/84 mmHg. General Notes: dialysis weight reported 10/30/21 Eyes conjunctiva clear no eyelid edema noted. pupils equal round and reactive to light and accommodation. Ears, Nose, Mouth, and Throat no gross abnormality of ear auricles or external auditory canals. normal hearing noted during conversation. mucus membranes moist. Respiratory normal breathing without difficulty. Musculoskeletal Patient is currently wearing a front offloading shoe to help with pressure relief and wound healing.Marland Kitchen Psychiatric this patient is able to make decisions and demonstrates good insight into disease process. Alert and Oriented x 3. pleasant and cooperative. 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 This has been very refractory and hard to clear he is actually still on IV antibiotics as well based on chart review. 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 ([id number]) Imaging studies to support CRO Patient has a CT scan which was performed on 10/18/2021 which reveals that he does have interval left first and second toe amputations with irregularity of the head of the left first metatarsal and overlying soft tissue defect with exposed bone and marked soft tissue swelling compatible with osteomyelitis and associated cellulitis. This was following patient's previous amputation of the first and second toes on 09/01/2021. Based on notes that were reviewed from 10/23/2021 revealed that the patient subsequently on 10/20/2021 had a revision of the left toe amputations. Subsequently with this revised amputation it still stated that the patient has not cleared margins as far as the osteomyelitis is concerned based on culture sent during this revision. It showed Enterococcus faecalis and staph lugdunensis. For that reason infectious disease is planning on additional extended course of antibiotics and in combination with this was recommending hyperbaric oxygen therapy which the patient has already started the first 4 visits of at [facility]. However its become hard for him to drive all the way there due to his dialysis in [place]. Antibiotic outcome According to his notes from [place] with admission on 10/27/2021 during his hospital stay where he was discharged on 10/29/2021 he was treated in the hospital with IV vancomycin, cefepime, and Flagyl.  He was eventually narrowed to vancomycin with dialysis on discharge to be continued until November 17, 2021. Prior to this most recent admission he had previously been on a previous course of vancomycin, cefepime, and metronidazole which began on 09/01/2021. Despite this he developed a repeat osteomyelitis infection which again is currently being managed. The hospital notes for this are actually attached above in the reason for HBO section. Surgical resection outcome Patient initially had the first and second toes amputated on September 01, 2021 and following this he had the antibiotic course as mentioned above. Subsequently he underwent revision on October 20, 2021  of the left first and second digit ray amputation site. There was wound VAC placement following. He also underwent angioplasty of the anterior tibial artery via the left femoral artery with vascular surgery on January 4. In this inpatient note at the end it is stated that given failure of osteomyelitis to resolve with a prolonged course of antibiotics and surgical debridement hyperbaric treatment is indicated. They also note that there is a Doppler signal now in the PT and DP arteries indicating there is blood flow to the foot for this to heal and hyperbaric treatment will deliver hyperoxic blood to left foot wound. X-Ray results There have been 2 chest x-rays taken both revealed that there is no evidence of acute pulmonary disease that would prohibit hyperbaric oxygen therapy Other imaging results Patient did have evaluation of his eyes and revealed he does not have glaucoma he does have cataracts however that will need to be taken care of but this should not affect hyperbaric oxygen therapy 1 way or another. The patient also had an echocardiogram which was performed and showed that he had a ejection fraction of 55% this should not be prohibitive to him undergoing hyperbaric oxygen therapy. Labs reviewed Upon review of the patient's labs  from his most recent inpatient. From October 27, 2021 through October 29, 2021 it appears that his white blood cell count was good with his most recent reading on October 28, 2021 being 6.1. He does show some signs of anemia with a hemoglobin of 8.8. He did have some abnormal kidney function findings which is consistent with him being on dialysis. Otherwise his CMP appears to be doing well. This is found within the inpatient progress note attached. I did did review the patient's A1c as well. And this was found to be 5.1 on 10/28/2021. Plan of care/Summary The plan of care currently is to aid the [facility] vascular team in helping to get this patient's foot healed. He has been through quite a bit over the past several months from his initial amputation which actually occurred on September 01, 2021. He was initially given 6 weeks worth of IV antibiotics including vancomycin, cefepime, and Flagyl. Unfortunately despite this he ended up having a worsening of the situation in general and ended up having to have a revision on October 20, 2021 of the first and second toe ray amputations due to evidence of osteomyelitis in the metatarsal region. At this time he also underwent angioplasty of the anterior tibial artery via the left femoral artery with vascular surgery on January 4. Infectious disease has also been integral in his course of treatment. He was treated with IV vancomycin, cefepime, and Flagyl again while in the hospital from January 1 until he was discharged on 10 January. At that time he was narrowed to vancomycin with dialysis on discharge to be continued until January 29 currently. He currently has a wound VAC in place and they are aggressively trying to get this healed as quickly as possible. With that being said I think that the hyperbaric oxygen therapy can be integral in helping to achieve healing and improvement of both the osteomyelitis which is present as well as the wound itself. Obviously the  faster we get this closed the greater likelihood of course of saving his limb. The ultimate goal is to prevent a below-knee amputation. He seems to have the appropriate blood flow at this time to do that as documented above and with the appropriate IV antibiotics the only other piece of the  puzzle is indeed the hyperbaric oxygen therapy which I hope will be integral in supporting this. Hopefully over the next several weeks he will see a decrease significantly in the size of the wound. My plan will be to have follow-up notes from the vascular service at [name] as they continue to follow his wound in order to ensure that he indeed is showing signs of improvement both in the quality of the wound as well as the size of the wound. This will be necessary since we are not actually treating the wound but just providing the hyperbaric oxygen therapy for the vascular service at [facility]. 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 Follow Hyperbaric Oxygen Glycemia Protocol 1. Based on what I am seeing currently I do believe that the patient is a candidate for hyperbaric oxygen therapy and again I  will attach all of the pretreatment review notes to this note as well which will be sent for the review process. Assuming that we can get everything approved I think that he is a excellent candidate for hyperbarics to try to save his foot and prevent him from having a further amputation. He is in agreement with this and states this will be much more convenient than having drive all the way to [facility]. 2. I am also can recommend currently that he continue to follow with his surgeons and specialist at [facility] for his normal follow-up appointments and wound care treatments. Obviously they are managing the wound VAC and the progress is being made as well we will see about getting interval update notes in order to track his progress and ensure the hyperbaric oxygen therapy is still appropriate for him. We will try to initiate hyperbaric oxygen therapy as soon as Monday if I can gain the appropriate approvals. Electronic Signature(s) Signed: 10/31/2021 6:24:59 PM By: Worthy Keeler PA-C Previous Signature: 10/31/2021 6:21:59 PM Version By: Worthy Keeler PA-C Previous Signature: 10/31/2021 5:25:13 PM Version By: Worthy Keeler PA-C Entered By: Worthy Keeler on 10/31/2021 18:24:58 [name], [name] ([id number]) -------------------------------------------------------------------------------- ROS/PFSH Details Patient Name: [name], [name] Date of Service: 10/31/2021 10:00 AM Medical Record Number: [id number] Patient Account Number: [id number] Date of Birth/Sex: [birth year] (56 y.o. Male) Treating RN: Donnamarie Poag Primary Care Provider: [name] Other Clinician: Referring Provider: Referral, Self Treating Provider/Extender: [name] in Treatment: 0 Information Obtained From Patient Constitutional Symptoms (General Health) Complaints and Symptoms: Negative for: Fatigue; Fever; Chills; Marked Weight Change Ear/Nose/Mouth/Throat Complaints and Symptoms: Negative for: Difficult clearing ears;  Sinusitis Hematologic/Lymphatic Complaints and Symptoms: Negative for: Bleeding / Clotting Disorders; Human Immunodeficiency Virus Medical History: Positive for: Anemia Respiratory Complaints and Symptoms: Positive for: Shortness of Breath Medical History: Positive for: Sleep Apnea - not sure he said Cardiovascular Complaints and Symptoms: Negative for: LE  edema Review of System Notes: stents Medical History: Positive for: Arrhythmia; Congestive Heart Failure; Coronary Artery Disease; Hypertension; Myocardial Infarction - hx Gastrointestinal Complaints and Symptoms: Negative for: Frequent diarrhea; Nausea; Vomiting Endocrine Complaints and Symptoms: Negative for: Hepatitis; Thyroid disease; Polydypsia (Excessive Thirst) Medical History: Positive for: Type II Diabetes - dx noted Time with diabetes: states "I am not diabetic" Treated with: Diet Genitourinary Complaints and Symptoms: Positive for: Kidney failure/ Dialysis - since 2012 Cotten, [name] L. 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; stated no blood flow Psychiatric Complaints and Symptoms: Negative for: Anxiety; Claustrophobia Eyes Medical History: Positive for: Cataracts - hx Neurologic Medical History: Positive for: Neuropathy; Seizure Disorder - hx 2022 Oncologic HBO Extended History Items Eyes: Cataracts Immunizations Pneumococcal Vaccine: Received Pneumococcal Vaccination: No Implantable Devices No devices added Family and Social History Never smoker; Marital Status - Married; Alcohol Use: Never; Drug Use: No History; Caffeine Use: Daily Electronic Signature(s) Signed: 11/01/2021 9:05:45 AM By: Worthy Keeler  PA-C Signed: 11/01/2021 4:02:12 PM By: Donnamarie Poag Entered By: Donnamarie Poag on 10/31/2021 11:12:21 Principato, [place] ([id number]) -------------------------------------------------------------------------------- SuperBill Details Patient Name: Bodley, Jonnatan L. Date of Service: 10/31/2021 Medical Record Number: [id number] Patient Account Number: [id number] Date of Birth/Sex: 13-Mar-1966 (56 y.o. 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 Other chronic osteomyelitis, left ankle and foot I10 Essential (primary) hypertension I25.10 Atherosclerotic heart disease of native coronary artery without angina pectoris I50.42 Chronic combined systolic (congestive) and diastolic (congestive) heart failure Facility Procedures CPT4 Code: 91638466 Description: 99213 - WOUND CARE VISIT-LEV 3 EST PT Modifier: Quantity: 1 Physician Procedures CPT4 Code: 5993570 Description: 17793 - WC PHYS LEVEL 4 - NEW PT Modifier: Quantity: 1 CPT4 Code: Description: ICD-10 Diagnosis Description E11.621 Type 2 diabetes mellitus with foot ulcer 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

## 2021-11-02 DIAGNOSIS — N186 End stage renal disease: Secondary | ICD-10-CM | POA: Diagnosis not present

## 2021-11-02 DIAGNOSIS — I120 Hypertensive chronic kidney disease with stage 5 chronic kidney disease or end stage renal disease: Secondary | ICD-10-CM | POA: Diagnosis not present

## 2021-11-02 DIAGNOSIS — I2510 Atherosclerotic heart disease of native coronary artery without angina pectoris: Secondary | ICD-10-CM | POA: Diagnosis not present

## 2021-11-02 DIAGNOSIS — E1151 Type 2 diabetes mellitus with diabetic peripheral angiopathy without gangrene: Secondary | ICD-10-CM | POA: Diagnosis not present

## 2021-11-02 DIAGNOSIS — Z4781 Encounter for orthopedic aftercare following surgical amputation: Secondary | ICD-10-CM | POA: Diagnosis not present

## 2021-11-02 DIAGNOSIS — E1122 Type 2 diabetes mellitus with diabetic chronic kidney disease: Secondary | ICD-10-CM | POA: Diagnosis not present

## 2021-11-04 ENCOUNTER — Other Ambulatory Visit: Payer: Self-pay

## 2021-11-04 ENCOUNTER — Encounter: Payer: Medicare Other | Admitting: Internal Medicine

## 2021-11-04 DIAGNOSIS — E782 Mixed hyperlipidemia: Secondary | ICD-10-CM | POA: Diagnosis not present

## 2021-11-04 DIAGNOSIS — I110 Hypertensive heart disease with heart failure: Secondary | ICD-10-CM | POA: Diagnosis not present

## 2021-11-04 DIAGNOSIS — Z89412 Acquired absence of left great toe: Secondary | ICD-10-CM | POA: Diagnosis not present

## 2021-11-04 DIAGNOSIS — E11621 Type 2 diabetes mellitus with foot ulcer: Secondary | ICD-10-CM | POA: Diagnosis not present

## 2021-11-04 DIAGNOSIS — B957 Other staphylococcus as the cause of diseases classified elsewhere: Secondary | ICD-10-CM | POA: Diagnosis not present

## 2021-11-04 DIAGNOSIS — I120 Hypertensive chronic kidney disease with stage 5 chronic kidney disease or end stage renal disease: Secondary | ICD-10-CM | POA: Diagnosis not present

## 2021-11-04 DIAGNOSIS — D631 Anemia in chronic kidney disease: Secondary | ICD-10-CM | POA: Diagnosis not present

## 2021-11-04 DIAGNOSIS — L03116 Cellulitis of left lower limb: Secondary | ICD-10-CM | POA: Diagnosis not present

## 2021-11-04 DIAGNOSIS — Z4781 Encounter for orthopedic aftercare following surgical amputation: Secondary | ICD-10-CM | POA: Diagnosis not present

## 2021-11-04 DIAGNOSIS — I5042 Chronic combined systolic (congestive) and diastolic (congestive) heart failure: Secondary | ICD-10-CM | POA: Diagnosis not present

## 2021-11-04 DIAGNOSIS — N186 End stage renal disease: Secondary | ICD-10-CM | POA: Diagnosis not present

## 2021-11-04 DIAGNOSIS — N2581 Secondary hyperparathyroidism of renal origin: Secondary | ICD-10-CM | POA: Diagnosis not present

## 2021-11-04 DIAGNOSIS — Z7982 Long term (current) use of aspirin: Secondary | ICD-10-CM | POA: Diagnosis not present

## 2021-11-04 DIAGNOSIS — I2510 Atherosclerotic heart disease of native coronary artery without angina pectoris: Secondary | ICD-10-CM | POA: Diagnosis not present

## 2021-11-04 DIAGNOSIS — Z7902 Long term (current) use of antithrombotics/antiplatelets: Secondary | ICD-10-CM | POA: Diagnosis not present

## 2021-11-04 DIAGNOSIS — I96 Gangrene, not elsewhere classified: Secondary | ICD-10-CM | POA: Diagnosis not present

## 2021-11-04 DIAGNOSIS — Z89431 Acquired absence of right foot: Secondary | ICD-10-CM | POA: Diagnosis not present

## 2021-11-04 DIAGNOSIS — Z992 Dependence on renal dialysis: Secondary | ICD-10-CM | POA: Diagnosis not present

## 2021-11-04 DIAGNOSIS — L97524 Non-pressure chronic ulcer of other part of left foot with necrosis of bone: Secondary | ICD-10-CM | POA: Diagnosis not present

## 2021-11-04 DIAGNOSIS — B952 Enterococcus as the cause of diseases classified elsewhere: Secondary | ICD-10-CM | POA: Diagnosis not present

## 2021-11-04 DIAGNOSIS — Z955 Presence of coronary angioplasty implant and graft: Secondary | ICD-10-CM | POA: Diagnosis not present

## 2021-11-04 DIAGNOSIS — Z792 Long term (current) use of antibiotics: Secondary | ICD-10-CM | POA: Diagnosis not present

## 2021-11-04 DIAGNOSIS — E1142 Type 2 diabetes mellitus with diabetic polyneuropathy: Secondary | ICD-10-CM | POA: Diagnosis not present

## 2021-11-04 DIAGNOSIS — Z79899 Other long term (current) drug therapy: Secondary | ICD-10-CM | POA: Diagnosis not present

## 2021-11-04 DIAGNOSIS — M86172 Other acute osteomyelitis, left ankle and foot: Secondary | ICD-10-CM | POA: Diagnosis not present

## 2021-11-04 DIAGNOSIS — E1122 Type 2 diabetes mellitus with diabetic chronic kidney disease: Secondary | ICD-10-CM | POA: Diagnosis not present

## 2021-11-04 DIAGNOSIS — M86672 Other chronic osteomyelitis, left ankle and foot: Secondary | ICD-10-CM | POA: Diagnosis not present

## 2021-11-04 DIAGNOSIS — E039 Hypothyroidism, unspecified: Secondary | ICD-10-CM | POA: Diagnosis not present

## 2021-11-04 DIAGNOSIS — Z9181 History of falling: Secondary | ICD-10-CM | POA: Diagnosis not present

## 2021-11-04 DIAGNOSIS — E1151 Type 2 diabetes mellitus with diabetic peripheral angiopathy without gangrene: Secondary | ICD-10-CM | POA: Diagnosis not present

## 2021-11-04 DIAGNOSIS — Z89422 Acquired absence of other left toe(s): Secondary | ICD-10-CM | POA: Diagnosis not present

## 2021-11-04 LAB — GLUCOSE, CAPILLARY
Glucose-Capillary: 153 mg/dL — ABNORMAL HIGH (ref 70–99)
Glucose-Capillary: 113 mg/dL — ABNORMAL HIGH (ref 70–99)

## 2021-11-04 NOTE — Progress Notes (Addendum)
Doorn, Dugger ([id number]) Visit Report for 11/04/2021 HBO Details Patient Name: [name], [name] Date of Service: 11/04/2021 12:30 PM Medical Record Number: [id number] Patient Account Number: [id number] Date of Birth/Sex: [birth year] (56 y.o. M) Treating RN: Cornell Barman Primary Care Jannatul Wojdyla: [name] Other Clinician: Jacqulyn Bath Referring Penni Penado: [name] Treating Evany Schecter/Extender: Tito Dine in Treatment: 0 HBO Treatment Course Details Treatment Course Number: 1 Ordering Cainen Burnham: Jeri Cos Total Treatments Ordered: 40 HBO Treatment Start Date: 11/04/2021 HBO Indication: Chronic Refractory Osteomyelitis to Left Foot HBO Treatment Details Treatment Number: 1 Patient Type: Outpatient Chamber Type: Monoplace Chamber Serial #: [id number] Treatment Protocol: 2.0 ATA with 90 minutes oxygen, and no air breaks Treatment Details Compression Rate Down: 1.0 psi / minute De-Compression Rate Up: 1.5 psi / minute Compress Tx Pressure Air breaks and breathing periods Decompress Decompress Begins Reached (leave unused spaces blank) Begins Ends Chamber Pressure (ATA) 1 2 - - - - - - 2 1 Clock Time (24 hr) 12:57 13:13 - - - - - - 14:44 14:55 Treatment Length: 118 (minutes) Treatment Segments: 4 Vital Signs Capillary Blood Glucose Reference Range: 80 - 120 mg / dl HBO Diabetic Blood Glucose Intervention Range: <131 mg/dl or >249 mg/dl Time Vitals Blood Respiratory Capillary Blood Glucose Pulse Action Type: Pulse: Temperature: Taken: Pressure: Rate: Glucose (mg/dl): Meter #: Oximetry (%) Taken: Pre 12:32 128/70 84 16 98.6 153 started treatment Post 15:00 142/78 80 16 98.6 113 patient discharged to home Treatment Response Treatment Toleration: Well Treatment Completion Treatment Completed without Adverse Event Status: Judine Arciniega Notes no concerns with treatment. pre Rx exam tympanic membrane wnl. resp clear. 2/6 sem no signs of chf HBO Attestation I certify  that I supervised this HBO treatment in accordance with Medicare guidelines. A trained emergency response team is readily Yes available per hospital policies and procedures. Continue HBOT as ordered. Yes Electronic Signature(s) Signed: 11/05/2021 7:38:21 AM By: Linton Ham MD Previous Signature: 11/04/2021 3:04:31 PM Version By: Gretta Cool BSN, RN, CWS, Kim RN, BSN Previous Signature: 11/04/2021 2:55:53 PM Version By: Gretta Cool BSN, RN, CWS, Kim RN, BSN Previous Signature: 11/04/2021 1:40:39 PM Version By: Gretta Cool, BSN, RN, CWS, Kim RN, BSN Entered By: Linton Ham on 11/04/2021 16:31:08 [name], [name] ([id number]) -------------------------------------------------------------------------------- HBO Safety Checklist Details Patient Name: [name], [name] Date of Service: 11/04/2021 12:30 PM Medical Record Number: [id number] Patient Account Number: [id number] Date of Birth/Sex: 31-May-1966 (56 y.o. M) Treating RN: Cornell Barman Primary Care Mykira Hofmeister: [name] Other Clinician: Jacqulyn Bath Referring Quest Tavenner: [name] Treating Windle Huebert/Extender: Tito Dine in Treatment: 0 HBO Safety Checklist Items Safety Checklist Consent Form Signed Patient voided / foley secured and emptied When did you last eato noon Last dose of injectable or oral agent n/a Ostomy pouch emptied and vented if applicable NA All implantable devices assessed, documented and approved NA Intravenous access site secured and place NA Valuables secured Linens and cotton and cotton/polyester blend (less than 51% polyester) Personal oil-based products / skin lotions / body lotions removed Wigs or hairpieces removed NA Smoking or tobacco materials removed NA Books / newspapers / magazines / loose paper removed NA Cologne, aftershave, perfume and deodorant removed Jewelry removed (may wrap wedding band) Make-up removed NA Hair care products removed Battery operated devices (external) removed NWT  pump removed Heating patches and chemical warmers removed NA Titanium eyewear removed NA Nail polish cured greater than 10 hours NA Casting material cured greater than 10 hours NA Hearing aids removed NA Loose dentures or partials  removed NA Prosthetics have been removed NA Patient demonstrates correct use of air break device (if applicable) Patient concerns have been addressed Patient grounding bracelet on and cord attached to chamber Specifics for Inpatients (complete in addition to above) Medication sheet sent with patient Intravenous medications needed or due during therapy sent with patient Drainage tubes (e.g. nasogastric tube or chest tube secured and vented) Endotracheal or Tracheotomy tube secured Cuff deflated of air and inflated with saline Airway suctioned Electronic Signature(s) Signed: 11/04/2021 1:39:52 PM By: Gretta Cool, BSN, RN, CWS, Kim RN, BSN Entered By: Gretta Cool, BSN, RN, CWS, Kim on 11/04/2021 13:39:52

## 2021-11-05 ENCOUNTER — Encounter: Payer: Medicare Other | Admitting: Physician Assistant

## 2021-11-05 DIAGNOSIS — E1151 Type 2 diabetes mellitus with diabetic peripheral angiopathy without gangrene: Secondary | ICD-10-CM | POA: Diagnosis not present

## 2021-11-05 DIAGNOSIS — M86172 Other acute osteomyelitis, left ankle and foot: Secondary | ICD-10-CM | POA: Diagnosis not present

## 2021-11-05 DIAGNOSIS — Z4781 Encounter for orthopedic aftercare following surgical amputation: Secondary | ICD-10-CM | POA: Diagnosis not present

## 2021-11-05 DIAGNOSIS — L03116 Cellulitis of left lower limb: Secondary | ICD-10-CM | POA: Diagnosis not present

## 2021-11-05 DIAGNOSIS — B957 Other staphylococcus as the cause of diseases classified elsewhere: Secondary | ICD-10-CM | POA: Diagnosis not present

## 2021-11-05 DIAGNOSIS — B952 Enterococcus as the cause of diseases classified elsewhere: Secondary | ICD-10-CM | POA: Diagnosis not present

## 2021-11-05 NOTE — Progress Notes (Signed)
Busta, [place] ([id number]) Visit Report for 11/04/2021 Arrival Information Details Patient Name: [name], [name]. Date of Service: 11/04/2021 12:30 PM Medical Record Number: [id number] Patient Account Number: [id number] Date of Birth/Sex: 23-Oct-1965 (56 y.o. M) Treating RN: Cornell Barman Primary Care Drago Hammonds: [name] Other Clinician: Jacqulyn Bath Referring Jesse Hirst: [name] Treating Arraya Buck/Extender: Tito Dine in Treatment: 0 Visit Information History Since Last Visit Added or deleted any medications: No Patient Arrived: Ambulatory Any new allergies or adverse reactions: No Arrival Time: 12:30 Had a fall or experienced change in No Accompanied By: self activities of daily living that may affect Transfer Assistance: None risk of falls: Patient Requires Transmission-Based No Signs or symptoms of abuse/neglect since last visito No Precautions: Hospitalized since last visit: No Patient Has Alerts: Yes Implantable device outside of the clinic excluding No Patient Alerts: Patient on Blood cellular tissue based products placed in the center Thinner since last visit: DIALYSIS RIGHT ARM Has Dressing in Place as Prescribed: Yes Pain Present Now: No Electronic Signature(s) Signed: 11/05/2021 9:21:45 AM By: Enedina Finner RCP, RRT, CHT Entered By: Stark Jock, Amado Nash on 11/04/2021 12:46:24 Willard, Butch L. ([id number]) -------------------------------------------------------------------------------- Encounter Discharge Information Details Patient Name: [name], [name] Date of Service: 11/04/2021 12:30 PM Medical Record Number: [id number] Patient Account Number: [id number] Date of Birth/Sex: 29-Jan-1966 (56 y.o. M) Treating RN: Cornell Barman Primary Care Cori Justus: [name] Other Clinician: Jacqulyn Bath Referring Johndaniel Catlin: [name] Treating Ranesha Val/Extender: Tito Dine in Treatment: 0 Encounter Discharge Information  Items Discharge Condition: Stable Ambulatory Status: Ambulatory Discharge Destination: Home Transportation: Private Auto Accompanied By: self Schedule Follow-up Appointment: Yes Clinical Summary of Care: Electronic Signature(s) Signed: 11/04/2021 3:05:44 PM By: Gretta Cool, BSN, RN, CWS, Kim RN, BSN Entered By: Gretta Cool, BSN, RN, CWS, Kim on 11/04/2021 15:05:43 Arth, [place] ([id number]) -------------------------------------------------------------------------------- General Visit Notes Details Patient Name: [name], [name] Date of Service: 11/04/2021 12:30 PM Medical Record Number: [id number] Patient Account Number: [id number] Date of Birth/Sex: Jun 30, 1966 (56 y.o. M) Treating RN: Cornell Barman Primary Care Osten Janek: [name] Other Clinician: Jacqulyn Bath Referring Lunetta Marina: [name] Treating Tamma Brigandi/Extender: Tito Dine in Treatment: 0 Notes Patient's wounds are being evaluated and treated by his surgeon and home health providers outside of the wound care center. We are providing Hyperbaric Oxygen Therapy only. Our facility is closer to the patient's home. Electronic Signature(s) Signed: 11/04/2021 1:53:36 PM By: Gretta Cool, BSN, RN, CWS, Kim RN, BSN Previous Signature: 11/04/2021 1:53:07 PM Version By: Gretta Cool, BSN, RN, CWS, Kim RN, BSN Entered By: Gretta Cool, BSN, RN, CWS, Kim on 11/04/2021 13:53:36 Spath, [place] ([id number]) -------------------------------------------------------------------------------- Vitals Details Patient Name: [name], [name] Date of Service: 11/04/2021 12:30 PM Medical Record Number: [id number] Patient Account Number: [id number] Date of Birth/Sex: 07-Mar-1966 (56 y.o. M) Treating RN: Cornell Barman Primary Care Sheala Dosh: [name] Other Clinician: Jacqulyn Bath Referring Nature Vogelsang: [name] Treating Janmarie Smoot/Extender: Tito Dine in Treatment: 0 Vital Signs Time Taken: 12:32 Temperature (F): 98.6 Height (in): 70 Pulse  (bpm): 84 Weight (lbs): 180.4 Respiratory Rate (breaths/min): 16 Body Mass Index (BMI): 25.9 Blood Pressure (mmHg): 128/70 Capillary Blood Glucose (mg/dl): 153 Reference Range: 80 - 120 mg / dl Electronic Signature(s) Signed: 11/05/2021 9:21:45 AM By: Enedina Finner RCP, RRT, CHT Entered By: Enedina Finner on 11/04/2021 12:47:02

## 2021-11-05 NOTE — Progress Notes (Signed)
Farooq, [place] ([id number]) Visit Report for 11/04/2021 Physician Orders Details Patient Name: [name], [name]. Date of Service: 11/04/2021 12:30 PM Medical Record Number: [id number] Patient Account Number: [id number] Date of Birth/Sex: 1966/08/03 (56 y.o. M) Treating RN: Cornell Barman Primary Care Provider: [name] Other Clinician: Jacqulyn Bath Referring Provider: [name] Treating Provider/Extender: Tito Dine in Treatment: 0 Verbal / Phone Orders: No Diagnosis Coding Follow-up Appointments o Return Appointment in: - [place] payor approval (daily Monday -Friday for HBO Treatments) [place] handles left foot wound vac and VASCULAR provider handles left foot Additional Orders / Instructions o Follow Nutritious Diet and Increase Protein Intake o Other: - Follow vascular, nephrology and PCP for wound care and other needs Dialysis appt as scheduled M-W-F Hyperbaric Oxygen Therapy o Indication and location: - 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 o Follow Hyperbaric Oxygen Glycemia Protocol GLYCEMIA INTERVENTIONS PROTOCOL PRE-HBO GLYCEMIA INTERVENTIONS ACTION INTERVENTION Obtain pre-HBO capillary blood glucose (ensure 1 physician order is in chart). A. Notify HBO physician and await physician orders. 2 If result is 70 mg/dl or below: B. If the result meets the hospital definition of a critical result, follow hospital policy. A. Give patient an 8 ounce Glucerna Shake, an 8 ounce Ensure, or 8 ounces of a Glucerna/Ensure equivalent dietary supplement*. B. Wait 30 minutes. If result  is 71 mg/dl to 130 mg/dl: C. Retest patientos capillary blood glucose (CBG). D. If result greater than or equal to 110 mg/dl, proceed with HBO. If result less than 110 mg/dl, notify HBO physician and consider holding HBO. If result is 131 mg/dl to 249 mg/dl: A. Proceed with HBO. A. Notify HBO physician and await physician orders. B. It is recommended to hold HBO and do If result is 250 mg/dl or greater: blood/urine ketone testing. C. If the result meets the hospital definition of a critical result, follow hospital policy. POST-HBO GLYCEMIA INTERVENTIONS ACTION INTERVENTION Obtain post HBO capillary blood glucose (ensure 1 physician order is in chart). A. Notify HBO physician and await physician orders. 2 If result is 70 mg/dl or below: B. If the result meets the hospital definition of a critical result, follow hospital policy. If result is 71 mg/dl to 100 mg/dl: A. Give patient an 8 ounce Glucerna Shake, an 8 ounce Ensure, or 8 ounces of a Sinopoli, Yamil L. ([id number]) Glucerna/Ensure equivalent dietary supplement*. B. Wait 15 minutes for symptoms of hypoglycemia (i.e. nervousness, anxiety, sweating, chills, clamminess, irritability, confusion, tachycardia or dizziness). C. If patient asymptomatic, discharge patient. If patient symptomatic, repeat capillary blood glucose (CBG) and notify HBO physician. If result is 101 mg/dl to 249 mg/dl: A. Discharge patient. A. Notify HBO physician and await physician orders. B. It is recommended to do blood/urine If result is 250 mg/dl or greater: ketone testing. C. If the result meets the hospital definition of a critical result, follow hospital policy. *Juice or candies are NOT equivalent products. If patient refuses the Glucerna or Ensure, please consult the hospital dietitian for an appropriate substitute. Electronic Signature(s) Signed: 11/04/2021 1:49:35 PM By: Gretta Cool, BSN, RN, CWS, Kim RN, BSN Signed: 11/05/2021 7:38:21 AM  By: Linton Ham MD Entered By: Gretta Cool, BSN,  RN, CWS, Kim on 11/04/2021 13:49:35 [name], [name] ([id number]) -------------------------------------------------------------------------------- SuperBill Details Patient Name: [name], [name] Date of Service: 11/04/2021 Medical Record Number: [id number] Patient Account Number: [id number] Date of Birth/Sex: [name], 1966 (56 y.o. M) Treating RN: Cornell Barman Primary Care Provider: [name] Other Clinician: Jacqulyn Bath Referring Provider: [name] Treating Provider/Extender: Tito Dine in Treatment: 0 Diagnosis Coding ICD-10 Codes Code Description [phone] Type 2 diabetes mellitus with foot ulcer 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e M86.672 Other chronic osteomyelitis, left ankle and foot I10 Essential (primary) hypertension I25.10 Atherosclerotic heart disease of native coronary artery without angina pectoris I50.42 Chronic combined systolic (congestive) and diastolic (congestive) heart failure Facility Procedures CPT4 Code: [id number] Description: (Facility Use Only) HBOT, full body chamber, 51min Modifier: Quantity: 4 Physician Procedures CPT4 Code: 7371062 Description: 69485 - WC PHYS HYPERBARIC OXYGEN THERAPY Modifier: Quantity: 1 CPT4 Code: Description: ICD-10 Diagnosis Description M86.672 Other chronic osteomyelitis, left ankle and foot E11.621 Type 2 diabetes mellitus with foot ulcer Modifier: Quantity: Electronic Signature(s) Signed: 11/04/2021 3:05:01 PM By: Gretta Cool, BSN, RN, CWS, Kim RN, BSN Signed: 11/05/2021 7:38:21 AM By: Linton Ham MD Entered By: Gretta Cool, BSN, RN, CWS, Kim on 11/04/2021 15:05:01

## 2021-11-06 ENCOUNTER — Encounter (HOSPITAL_BASED_OUTPATIENT_CLINIC_OR_DEPARTMENT_OTHER): Payer: Medicare Other | Admitting: Internal Medicine

## 2021-11-06 ENCOUNTER — Other Ambulatory Visit: Payer: Self-pay

## 2021-11-06 DIAGNOSIS — E11621 Type 2 diabetes mellitus with foot ulcer: Secondary | ICD-10-CM

## 2021-11-06 DIAGNOSIS — M86672 Other chronic osteomyelitis, left ankle and foot: Secondary | ICD-10-CM | POA: Diagnosis not present

## 2021-11-06 DIAGNOSIS — N2581 Secondary hyperparathyroidism of renal origin: Secondary | ICD-10-CM | POA: Diagnosis not present

## 2021-11-06 DIAGNOSIS — I110 Hypertensive heart disease with heart failure: Secondary | ICD-10-CM | POA: Diagnosis not present

## 2021-11-06 DIAGNOSIS — I2510 Atherosclerotic heart disease of native coronary artery without angina pectoris: Secondary | ICD-10-CM | POA: Diagnosis not present

## 2021-11-06 DIAGNOSIS — I5042 Chronic combined systolic (congestive) and diastolic (congestive) heart failure: Secondary | ICD-10-CM | POA: Diagnosis not present

## 2021-11-06 DIAGNOSIS — D631 Anemia in chronic kidney disease: Secondary | ICD-10-CM | POA: Diagnosis not present

## 2021-11-06 DIAGNOSIS — Z992 Dependence on renal dialysis: Secondary | ICD-10-CM | POA: Diagnosis not present

## 2021-11-06 DIAGNOSIS — L97524 Non-pressure chronic ulcer of other part of left foot with necrosis of bone: Secondary | ICD-10-CM | POA: Diagnosis not present

## 2021-11-06 DIAGNOSIS — N186 End stage renal disease: Secondary | ICD-10-CM | POA: Diagnosis not present

## 2021-11-06 DIAGNOSIS — I96 Gangrene, not elsewhere classified: Secondary | ICD-10-CM | POA: Diagnosis not present

## 2021-11-06 LAB — GLUCOSE, CAPILLARY
Glucose-Capillary: 123 mg/dL — ABNORMAL HIGH (ref 70–99)
Glucose-Capillary: 170 mg/dL — ABNORMAL HIGH (ref 70–99)
Glucose-Capillary: 126 mg/dL — ABNORMAL HIGH (ref 70–99)

## 2021-11-07 ENCOUNTER — Encounter: Payer: Medicare Other | Admitting: Physician Assistant

## 2021-11-08 ENCOUNTER — Other Ambulatory Visit: Payer: Self-pay

## 2021-11-08 ENCOUNTER — Encounter: Payer: Medicare Other | Admitting: Internal Medicine

## 2021-11-08 DIAGNOSIS — I2510 Atherosclerotic heart disease of native coronary artery without angina pectoris: Secondary | ICD-10-CM | POA: Diagnosis not present

## 2021-11-08 DIAGNOSIS — M86672 Other chronic osteomyelitis, left ankle and foot: Secondary | ICD-10-CM | POA: Diagnosis not present

## 2021-11-08 DIAGNOSIS — L97524 Non-pressure chronic ulcer of other part of left foot with necrosis of bone: Secondary | ICD-10-CM | POA: Diagnosis not present

## 2021-11-08 DIAGNOSIS — L03116 Cellulitis of left lower limb: Secondary | ICD-10-CM | POA: Diagnosis not present

## 2021-11-08 DIAGNOSIS — I110 Hypertensive heart disease with heart failure: Secondary | ICD-10-CM | POA: Diagnosis not present

## 2021-11-08 DIAGNOSIS — Z992 Dependence on renal dialysis: Secondary | ICD-10-CM | POA: Diagnosis not present

## 2021-11-08 DIAGNOSIS — E11621 Type 2 diabetes mellitus with foot ulcer: Secondary | ICD-10-CM | POA: Diagnosis not present

## 2021-11-08 DIAGNOSIS — I5042 Chronic combined systolic (congestive) and diastolic (congestive) heart failure: Secondary | ICD-10-CM | POA: Diagnosis not present

## 2021-11-08 DIAGNOSIS — D631 Anemia in chronic kidney disease: Secondary | ICD-10-CM | POA: Diagnosis not present

## 2021-11-08 DIAGNOSIS — I96 Gangrene, not elsewhere classified: Secondary | ICD-10-CM | POA: Diagnosis not present

## 2021-11-08 DIAGNOSIS — N2581 Secondary hyperparathyroidism of renal origin: Secondary | ICD-10-CM | POA: Diagnosis not present

## 2021-11-08 DIAGNOSIS — N186 End stage renal disease: Secondary | ICD-10-CM | POA: Diagnosis not present

## 2021-11-08 LAB — GLUCOSE, CAPILLARY: Glucose-Capillary: 140 mg/dL — ABNORMAL HIGH (ref 70–99)

## 2021-11-08 NOTE — Progress Notes (Signed)
[name], [place] ([id number]) Visit Report for 11/08/2021 HBO Details Patient Name: [name], [name] Date of Service: 11/08/2021 10:00 AM Medical Record Number: [id number] Patient Account Number: [id number] Date of Birth/Sex: 1966/07/10 (56 y.o. M) Treating RN: Cornell Barman Primary Care Wayne Wicklund: [name] Other Clinician: Jacqulyn Bath Referring Forrester Blando: [name] Treating Virgil Slinger/Extender: Tito Dine in Treatment: 1 HBO Treatment Course Details Treatment Course Number: 1 Ordering Ajee Heasley: Jeri Cos Total Treatments Ordered: 40 HBO Treatment Start Date: 11/04/2021 HBO Indication: Chronic Refractory Osteomyelitis to Left Foot HBO Treatment Details Treatment Number: 3 Patient Type: Outpatient Chamber Type: Monoplace Chamber Serial #: [id number] Treatment Protocol: 2.0 ATA with 90 minutes oxygen, and no air breaks Treatment Details Compression Rate Down: 1.0 psi / minute De-Compression Rate Up: 1.5 psi / minute Compress Tx Pressure Air breaks and breathing periods Decompress Decompress Begins Reached (leave unused spaces blank) Begins Ends Chamber Pressure (ATA) 1 2 - - - - - - 2 1 Clock Time (24 hr) 11:47 12:04 - - - - - - - 13:44 Treatment Length: 117 (minutes) Treatment Segments: 4 Vital Signs Capillary Blood Glucose Reference Range: 80 - 120 mg / dl HBO Diabetic Blood Glucose Intervention Range: <131 mg/dl or >249 mg/dl Time Vitals Blood Respiratory Capillary Blood Glucose Pulse Action Type: Pulse: Temperature: Taken: Pressure: Rate: Glucose (mg/dl): Meter #: Oximetry (%) Taken: Pre 11:30 128/78 84 16 98.6 140 1 none per protocol Post 13:51 130/78 84 16 98.6 140 1 none per protocol Treatment Response Treatment Toleration: Well Treatment Completion Treatment Completed without Adverse Event Status: [name] Notes Patient tolerated the treatment for the duration of time. 2 atm. He had some problems clearing his left ear I looked at these under  treatment his tympanic membrane on the right was normal on the left I did not see anything major here. I told him we would continue to be vigilant about this next week. HBO Attestation I certify that I supervised this HBO treatment in accordance with Medicare guidelines. A trained emergency response team is readily Yes available per hospital policies and procedures. Continue HBOT as ordered. Yes Electronic Signature(s) Signed: 11/08/2021 3:34:05 PM By: Linton Ham MD Previous Signature: 11/08/2021 2:32:00 PM Version By: Enedina Finner RCP, RRT, CHT Entered By: Linton Ham on 11/08/2021 15:33:21 [name], [name] ([id number]) -------------------------------------------------------------------------------- HBO Safety Checklist Details Patient Name: [name], [name] Date of Service: 11/08/2021 10:00 AM Medical Record Number: [id number] Patient Account Number: [id number] Date of Birth/Sex: 08-Nov-1965 (56 y.o. M) Treating RN: Cornell Barman Primary Care Luan Maberry: [name] Other Clinician: Jacqulyn Bath Referring Durga Saldarriaga: [name] Treating Rosell Khouri/Extender: Tito Dine in Treatment: 1 HBO Safety Checklist Items Safety Checklist Consent Form Signed Patient voided / foley secured and emptied When did you last eato noon Last dose of injectable or oral agent n/a Ostomy pouch emptied and vented if applicable NA All implantable devices assessed, documented and approved NA Intravenous access site secured and place NA Valuables secured Linens and cotton and cotton/polyester blend (less than 51% polyester) Personal oil-based products / skin lotions / body lotions removed Wigs or hairpieces removed NA Smoking or tobacco materials removed NA Books / newspapers / magazines / loose paper removed NA Cologne, aftershave, perfume and deodorant removed Jewelry removed (may wrap wedding band) Make-up removed NA Hair care products removed Battery operated  devices (external) removed NPWT pump removed Heating patches and chemical warmers removed NA Titanium eyewear removed NA Nail polish cured greater than 10 hours NA Casting material cured greater than  10 hours NA Hearing aids removed NA Loose dentures or partials removed NA Prosthetics have been removed NA Patient demonstrates correct use of air break device (if applicable) Patient concerns have been addressed Patient grounding bracelet on and cord attached to chamber Specifics for Inpatients (complete in addition to above) Medication sheet sent with patient Intravenous medications needed or due during therapy sent with patient Drainage tubes (e.g. nasogastric tube or chest tube secured and vented) Endotracheal or Tracheotomy tube secured Cuff deflated of air and inflated with saline Airway suctioned Electronic Signature(s) Signed: 11/08/2021 2:32:00 PM By: Enedina Finner RCP, RRT, CHT Entered By: Enedina Finner on 11/08/2021 11:39:33

## 2021-11-08 NOTE — Progress Notes (Signed)
[name] ([id number]) Visit Report for 11/08/2021 Arrival Information Details Patient Name: [name], [name]. Date of Service: 11/08/2021 10:00 AM Medical Record Number: [id number] Patient Account Number: [id number] Date of Birth/Sex: 1966-04-11 (56 y.o. M) Treating RN: Cornell Barman Primary Care Bram Hottel: [name] Other Clinician: Jacqulyn Bath Referring Chaston Bradburn: [name] Treating Cruze Zingaro/Extender: Tito Dine in Treatment: 1 Visit Information History Since Last Visit Added or deleted any medications: No Patient Arrived: Ambulatory Any new allergies or adverse reactions: No Arrival Time: 11:30 Had a fall or experienced change in No Accompanied By: self activities of daily living that may affect Transfer Assistance: None risk of falls: Patient Identification Verified: Yes Signs or symptoms of abuse/neglect since last visito No Secondary Verification Process Completed: Yes Hospitalized since last visit: No Patient Requires Transmission-Based No Implantable device outside of the clinic excluding No Precautions: cellular tissue based products placed in the center Patient Has Alerts: Yes since last visit: Patient Alerts: Patient on Blood Has Dressing in Place as Prescribed: Yes Thinner Has Footwear/Offloading in Place as Prescribed: Yes DIALYSIS RIGHT ARM Left: Surgical Shoe with Pressure Relief Insole Pain Present Now: No Electronic Signature(s) Signed: 11/08/2021 2:32:00 PM By: Enedina Finner RCP, RRT, CHT Entered By: Enedina Finner on 11/08/2021 11:37:33 Hertzberg, Tahmid L. ([id number]) -------------------------------------------------------------------------------- Encounter Discharge Information Details Patient Name: [name], [name] Date of Service: 11/08/2021 10:00 AM Medical Record Number: [id number] Patient Account Number: [id number] Date of Birth/Sex: 1965-10-28 (56 y.o. M) Treating RN: Cornell Barman Primary Care Jeffey Janssen:  [name] Other Clinician: Jacqulyn Bath Referring British Moyd: [name] Treating Tomi Grandpre/Extender: Tito Dine in Treatment: 1 Encounter Discharge Information Items Discharge Condition: Stable Ambulatory Status: Ambulatory Discharge Destination: Home Transportation: Private Auto Accompanied By: self Schedule Follow-up Appointment: Yes Clinical Summary of Care: Notes Patient has an HBO treatment scheduled on 11/11/21 at 10:30 am. Electronic Signature(s) Signed: 11/08/2021 2:32:00 PM By: Enedina Finner RCP, RRT, CHT Entered By: Enedina Finner on 11/08/2021 14:31:37 Manke, [place] L. ([id number]) -------------------------------------------------------------------------------- Vitals Details Patient Name: [name], [name] Date of Service: 11/08/2021 10:00 AM Medical Record Number: [id number] Patient Account Number: [id number] Date of Birth/Sex: 1966-05-17 (56 y.o. M) Treating RN: Cornell Barman Primary Care Kendyll Huettner: [name] Other Clinician: Jacqulyn Bath Referring Deronte Solis: [name] Treating Ostin Mathey/Extender: Tito Dine in Treatment: 1 Vital Signs Time Taken: 11:30 Temperature (F): 98.6 Height (in): 70 Pulse (bpm): 84 Weight (lbs): 180.4 Respiratory Rate (breaths/min): 16 Body Mass Index (BMI): 25.9 Blood Pressure (mmHg): 128/78 Capillary Blood Glucose (mg/dl): 140 Reference Range: 80 - 120 mg / dl Electronic Signature(s) Signed: 11/08/2021 2:32:00 PM By: Enedina Finner RCP, RRT, CHT Entered By: Enedina Finner on 11/08/2021 11:38:10

## 2021-11-08 NOTE — Progress Notes (Signed)
Welch, [place] ([id number]) Visit Report for 11/06/2021 Arrival Information Details Patient Name: [name], [name]. Date of Service: 11/06/2021 12:30 PM Medical Record Number: [id number] Patient Account Number: [id number] Date of Birth/Sex: 1966/09/27 (56 y.o. M) Treating RN: Cornell Barman Primary Care Kalicia Dufresne: [name] Other Clinician: Jacqulyn Bath Referring Emila Steinhauser: [name] Treating Ayleen Mckinstry/Extender: Yaakov Guthrie in Treatment: 0 Visit Information History Since Last Visit Added or deleted any medications: No Patient Arrived: Ambulatory Any new allergies or adverse reactions: No Arrival Time: 12:05 Had a fall or experienced change in No Accompanied By: self activities of daily living that may affect Transfer Assistance: None risk of falls: Patient Identification Verified: Yes Signs or symptoms of abuse/neglect since last visito No Secondary Verification Process Completed: Yes Hospitalized since last visit: No Patient Requires Transmission-Based No Implantable device outside of the clinic excluding No Precautions: cellular tissue based products placed in the center Patient Has Alerts: Yes since last visit: Patient Alerts: Patient on Blood Pain Present Now: No Thinner DIALYSIS RIGHT ARM Electronic Signature(s) Signed: 11/08/2021 2:32:00 PM By: Enedina Finner RCP, RRT, CHT Entered By: Stark Jock, Amado Nash on 11/06/2021 13:22:15 Peavler, [place] ([id number]) -------------------------------------------------------------------------------- Encounter Discharge Information Details Patient Name: Bekele, Brock L. Date of Service: 11/06/2021 12:30 PM Medical Record Number: [id number] Patient Account Number: [id number] Date of Birth/Sex: [birth year] (56 y.o. M) Treating RN: Cornell Barman Primary Care Kimon Loewen: [name] Other Clinician: Jacqulyn Bath Referring Yesli Vanderhoff: [name] Treating Diora Bellizzi/Extender: Yaakov Guthrie in Treatment:  0 Encounter Discharge Information Items Discharge Condition: Stable Ambulatory Status: Ambulatory Discharge Destination: Home Transportation: Private Auto Accompanied By: self Schedule Follow-up Appointment: No Clinical Summary of Care: Notes Patient has an HBO treatment scheduled on 11/08/21 at 10:00 am. Electronic Signature(s) Signed: 11/08/2021 2:32:00 PM By: Enedina Finner RCP, RRT, CHT Entered By: Enedina Finner on 11/06/2021 15:38:58 Botto, Jessey L. ([id number]) -------------------------------------------------------------------------------- Vitals Details Patient Name: Reger, Coleby L. Date of Service: 11/06/2021 12:30 PM Medical Record Number: [id number] Patient Account Number: [id number] Date of Birth/Sex: [birth year] (56 y.o. M) Treating RN: Cornell Barman Primary Care Binyamin Nelis: [name] Other Clinician: Jacqulyn Bath Referring Joelie Schou: [name] Treating Jordanne Elsbury/Extender: Yaakov Guthrie in Treatment: 0 Vital Signs Time Taken: 12:23 Temperature (F): 98.6 Height (in): 70 Pulse (bpm): 72 Weight (lbs): 180.4 Respiratory Rate (breaths/min): 16 Body Mass Index (BMI): 25.9 Blood Pressure (mmHg): 128/70 Capillary Blood Glucose (mg/dl): 123 Reference Range: 80 - 120 mg / dl Electronic Signature(s) Signed: 11/08/2021 2:32:00 PM By: Enedina Finner RCP, RRT, CHT Entered By: Enedina Finner on 11/06/2021 15:37:55

## 2021-11-08 NOTE — Progress Notes (Signed)
Mcdougald, [name] ([id number]) Visit Report for 11/06/2021 HBO Details Patient Name: [name], [name] Date of Service: 11/06/2021 12:30 PM Medical Record Number: [id number] Patient Account Number: [id number] Date of Birth/Sex: [birth year] (56 y.o. M) Treating RN: Cornell Barman Primary Care Emmeline Winebarger: [name] Other Clinician: Jacqulyn Bath Referring Marquette Piontek: [name] Treating Beryl Balz/Extender: Yaakov Guthrie in Treatment: 0 HBO Treatment Course Details Treatment Course Number: 1 Ordering Dalon Reichart: Jeri Cos Total Treatments Ordered: 10 HBO Treatment Start Date: 11/04/2021 HBO Indication: Chronic Refractory Osteomyelitis to Left Foot HBO Treatment Details Treatment Number: 2 Patient Type: Outpatient Chamber Type: Monoplace Chamber Serial #: [id number] Treatment Protocol: 2.0 ATA with 90 minutes oxygen, and no air breaks Treatment Details Compression Rate Down: 1.0 psi / minute De-Compression Rate Up: 1.5 psi / minute Compress Tx Pressure Air breaks and breathing periods Decompress Decompress Begins Reached (leave unused spaces blank) Begins Ends Chamber Pressure (ATA) 1 2 - - - - - - 2 1 Clock Time (24 hr) 13:10 13:28 - - - - - - 14:58 15:08 Treatment Length: 118 (minutes) Treatment Segments: 4 Vital Signs Capillary Blood Glucose Reference Range: 80 - 120 mg / dl HBO Diabetic Blood Glucose Intervention Range: <131 mg/dl or >249 mg/dl Capillary Time Glucose Pulse Blood Respiratory Blood Action Type: Vitals Pulse: Temperature: Meter Oximetry Pressure: Rate: Glucose Taken: Taken: #: (%) (mg/dl): per MD gave peanut butter and Pre 12:23 128/70 72 16 98.6 123 1 crackers (see note) Pre 12:57 128/70 72 16 98.6 170 1 MD notified and proceeded with HBO Post 15:14 124/72 72 16 98.9 126 1 none per protocol Treatment Response Treatment Toleration: Well Treatment Completion Treatment Completed without Adverse Event Status: Treatment Notes 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 Per MD patient was given 4 tablespoons of peanut butter, 4 graham crackers and 8 ounces of apple juice. HBO Attestation I certify that I supervised this HBO treatment in accordance with Medicare guidelines. A trained emergency response team is readily Yes available per hospital policies and procedures. Continue HBOT as ordered. Yes Electronic Signature(s) Signed: 11/06/2021 5:25:13 PM By: Kalman Shan DO Entered By: Kalman Shan on 11/06/2021 17:24:58 [name], [name] ([id number]) [name], [name] ([id number]) -------------------------------------------------------------------------------- HBO Safety Checklist Details Patient Name: [name], [name] Date of Service: 11/06/2021 12:30 PM Medical Record Number: [id number] Patient Account Number: [id number] Date of Birth/Sex: 1966-10-13 (56 y.o. M) Treating RN: Cornell Barman Primary Care Sonny Anthes: [name] Other Clinician: Jacqulyn Bath Referring Haddy Mullinax: [name] Treating Oslo Huntsman/Extender: Yaakov Guthrie in Treatment: 0 HBO Safety Checklist Items Safety Checklist Consent Form Signed Patient voided / foley secured and emptied When did you last eato 10:00 am Last dose of injectable or oral agent n/a Ostomy pouch emptied and vented if applicable NA All implantable devices assessed, documented and approved NA Intravenous access site secured and place NA Valuables secured Linens and cotton and cotton/polyester blend (less than 51% polyester) Personal oil-based products / skin lotions / body lotions removed Wigs or hairpieces removed NA Smoking or tobacco materials removed NA Books / newspapers / magazines / loose paper removed NA Cologne, aftershave, perfume and deodorant removed Jewelry removed (may wrap wedding band) Make-up removed NA Hair care products removed Battery operated devices (external) removed NPWT pump removed Heating patches and chemical warmers  removed NA Titanium eyewear removed NA Nail polish cured greater than 10 hours NA Casting material cured greater than 10 hours NA Hearing aids removed NA Loose dentures or partials removed NA Prosthetics have been removed  NA Patient demonstrates correct use of air break device (if applicable) Patient concerns have been addressed Patient grounding bracelet on and cord attached to chamber Specifics for Inpatients (complete in addition to above) Medication sheet sent with patient Intravenous medications needed or due during therapy sent with patient Drainage tubes (e.g. nasogastric tube or chest tube secured and vented) Endotracheal or Tracheotomy tube secured Cuff deflated of air and inflated with saline Airway suctioned Electronic Signature(s) Signed: 11/08/2021 2:32:00 PM By: Enedina Finner RCP, RRT, CHT Entered By: Enedina Finner on 11/06/2021 13:30:22

## 2021-11-09 DIAGNOSIS — E1151 Type 2 diabetes mellitus with diabetic peripheral angiopathy without gangrene: Secondary | ICD-10-CM | POA: Diagnosis not present

## 2021-11-09 DIAGNOSIS — B952 Enterococcus as the cause of diseases classified elsewhere: Secondary | ICD-10-CM | POA: Diagnosis not present

## 2021-11-09 DIAGNOSIS — B957 Other staphylococcus as the cause of diseases classified elsewhere: Secondary | ICD-10-CM | POA: Diagnosis not present

## 2021-11-09 DIAGNOSIS — L03116 Cellulitis of left lower limb: Secondary | ICD-10-CM | POA: Diagnosis not present

## 2021-11-09 DIAGNOSIS — Z4781 Encounter for orthopedic aftercare following surgical amputation: Secondary | ICD-10-CM | POA: Diagnosis not present

## 2021-11-09 DIAGNOSIS — M86172 Other acute osteomyelitis, left ankle and foot: Secondary | ICD-10-CM | POA: Diagnosis not present

## 2021-11-11 ENCOUNTER — Encounter: Payer: Medicare Other | Admitting: Physician Assistant

## 2021-11-11 ENCOUNTER — Other Ambulatory Visit: Payer: Self-pay

## 2021-11-11 DIAGNOSIS — D631 Anemia in chronic kidney disease: Secondary | ICD-10-CM | POA: Diagnosis not present

## 2021-11-11 DIAGNOSIS — I96 Gangrene, not elsewhere classified: Secondary | ICD-10-CM | POA: Diagnosis not present

## 2021-11-11 DIAGNOSIS — N2581 Secondary hyperparathyroidism of renal origin: Secondary | ICD-10-CM | POA: Diagnosis not present

## 2021-11-11 DIAGNOSIS — N186 End stage renal disease: Secondary | ICD-10-CM | POA: Diagnosis not present

## 2021-11-11 DIAGNOSIS — I110 Hypertensive heart disease with heart failure: Secondary | ICD-10-CM | POA: Diagnosis not present

## 2021-11-11 DIAGNOSIS — L97524 Non-pressure chronic ulcer of other part of left foot with necrosis of bone: Secondary | ICD-10-CM | POA: Diagnosis not present

## 2021-11-11 DIAGNOSIS — M86672 Other chronic osteomyelitis, left ankle and foot: Secondary | ICD-10-CM | POA: Diagnosis not present

## 2021-11-11 DIAGNOSIS — Z992 Dependence on renal dialysis: Secondary | ICD-10-CM | POA: Diagnosis not present

## 2021-11-11 DIAGNOSIS — I2510 Atherosclerotic heart disease of native coronary artery without angina pectoris: Secondary | ICD-10-CM | POA: Diagnosis not present

## 2021-11-11 DIAGNOSIS — I5042 Chronic combined systolic (congestive) and diastolic (congestive) heart failure: Secondary | ICD-10-CM | POA: Diagnosis not present

## 2021-11-11 DIAGNOSIS — E11621 Type 2 diabetes mellitus with foot ulcer: Secondary | ICD-10-CM | POA: Diagnosis not present

## 2021-11-11 LAB — GLUCOSE, CAPILLARY
Glucose-Capillary: 157 mg/dL — ABNORMAL HIGH (ref 70–99)
Glucose-Capillary: 106 mg/dL — ABNORMAL HIGH (ref 70–99)

## 2021-11-11 NOTE — Progress Notes (Signed)
[name] ([id number]) Visit Report for 11/11/2021 HBO Details Patient Name: [name], [name] Date of Service: 11/11/2021 10:30 AM Medical Record Number: [id number] Patient Account Number: [id number] Date of Birth/Sex: 27-Oct-1965 (56 y.o. M) Treating RN: [name] Primary Care Asmara [name]: [name] Other Clinician: Jacqulyn [name] Referring [name]: [name] Treating [name]/Extender: [name] in Treatment: 1 HBO Treatment Course Details Treatment Course Number: 1 Ordering [name]: [name] Total Treatments Ordered: 40 HBO Treatment Start Date: 11/04/2021 HBO Indication: Chronic Refractory Osteomyelitis to Left Foot HBO Treatment Details Treatment Number: 4 Patient Type: Outpatient Chamber Type: Monoplace Chamber Serial #: [id number] Treatment Protocol: 2.0 ATA with 90 minutes oxygen, and no air breaks Treatment Details Compression Rate Down: 1.0 psi / minute De-Compression Rate Up: 1.5 psi / minute Compress Tx Pressure Air breaks and breathing periods Decompress Decompress Begins Reached (leave unused spaces blank) Begins Ends Chamber Pressure (ATA) 1 2 - - - - - - 2 1 Clock Time (24 hr) 11:04 11:21 - - - - - - 12:52 13:02 Treatment Length: 118 (minutes) Treatment Segments: 4 Vital Signs Capillary Blood Glucose Reference Range: 80 - 120 mg / dl HBO Diabetic Blood Glucose Intervention Range: <131 mg/dl or >249 mg/dl Time Vitals Blood Respiratory Capillary Blood Glucose Pulse Action Type: Pulse: Temperature: Taken: Pressure: Rate: Glucose (mg/dl): Meter #: Oximetry (%) Taken: Pre 10:45 124/78 72 16 98.5 157 1 none per protocol Post 13:06 132/76 84 16 98.6 106 1 none per protocol Pre-Treatment Ear Evaluation Left Right Clear: Yes Clear: Yes Intact: Yes Intact: Yes Color: grey Color: grey Left [name] Scale: Grade 0 Right [name] Scale: Grade 0 Treatment Response Treatment Toleration: Well Treatment Completion Treatment Completed without Adverse  Event Status: HBO Attestation I certify that I supervised this HBO treatment in accordance with Medicare guidelines. A trained emergency response team is readily Yes available per hospital policies and procedures. Continue HBOT as ordered. Yes Electronic Signature(s) Signed: 11/11/2021 4:04:49 PM By: [name] Keeler PA-C Previous Signature: 11/11/2021 2:27:39 PM Version By: [name] RCP, RRT, CHT Entered By: [name] on 11/11/2021 16:04:48 [name], [name] ([id number]) [name], [name] ([id number]) -------------------------------------------------------------------------------- HBO Safety Checklist Details Patient Name: [name], [name] Date of Service: 11/11/2021 10:30 AM Medical Record Number: [id number] Patient Account Number: [id number] Date of Birth/Sex: [birth year] (56 y.o. M) Treating RN: [name] Primary Care [name]: [name] Other Clinician: Jacqulyn [name] Referring [name]: [name] Treating [name]/Extender: [name] in Treatment: 1 HBO Safety Checklist Items Safety Checklist Consent Form Signed Patient voided / foley secured and emptied When did you last eato 10:00 Last dose of injectable or oral agent n/a Ostomy pouch emptied and vented if applicable NA All implantable devices assessed, documented and approved NA Intravenous access site secured and place NA Valuables secured Linens and cotton and cotton/polyester blend (less than 51% polyester) Personal oil-based products / skin lotions / body lotions removed Wigs or hairpieces removed NA Smoking or tobacco materials removed NA Books / newspapers / magazines / loose paper removed NA Cologne, aftershave, perfume and deodorant removed Jewelry removed (may wrap wedding band) Make-up removed NA Hair care products removed Battery operated devices (external) removed NPWT pump Heating patches and chemical warmers removed NA Titanium eyewear removed NA Nail polish  cured greater than 10 hours NA Casting material cured greater than 10 hours NA Hearing aids removed NA Loose dentures or partials removed NA Prosthetics have been removed NA Patient demonstrates correct use of air break device (if applicable)  Patient concerns have been addressed Patient grounding bracelet on and cord attached to chamber Specifics for Inpatients (complete in addition to above) Medication sheet sent with patient Intravenous medications needed or due during therapy sent with patient Drainage tubes (e.g. nasogastric tube or chest tube secured and vented) Endotracheal or Tracheotomy tube secured Cuff deflated of air and inflated with saline Airway suctioned Electronic Signature(s) Signed: 11/11/2021 2:27:39 PM By: [name] RCP, RRT, CHT Entered By: [name] on 11/11/2021 11:16:05

## 2021-11-11 NOTE — Progress Notes (Signed)
[name], [name] ([id number]) Visit Report for 11/11/2021 Arrival Information Details Patient Name: [name], [name]. Date of Service: 11/11/2021 10:30 AM Medical Record Number: [id number] Patient Account Number: [id number] Date of Birth/Sex: Feb 22, 1966 (56 y.o. M) Treating RN: [name] Primary Care [name]: [name] Other Clinician: Jacqulyn [name] Referring [name]: [name] Treating [name]/Extender: [name] in Treatment: 1 Visit Information History Since Last Visit Added or deleted any medications: No Patient Arrived: Ambulatory Any new allergies or adverse reactions: No Arrival Time: 10:45 Had a fall or experienced change in No Accompanied By: self activities of daily living that may affect Transfer Assistance: None risk of falls: Patient Identification Verified: Yes Signs or symptoms of abuse/neglect since last visito No Secondary Verification Process Completed: Yes Hospitalized since last visit: No Patient Requires Transmission-Based No Implantable device outside of the clinic excluding No Precautions: cellular tissue based products placed in the center Patient Has Alerts: Yes since last visit: Patient Alerts: Patient on Blood Pain Present Now: No Thinner DIALYSIS RIGHT ARM Electronic Signature(s) Signed: 11/11/2021 2:27:39 PM By: [name] RCP, RRT, CHT Entered By: [name], [name] on 11/11/2021 11:15:12 [name], [name] ([id number]) -------------------------------------------------------------------------------- Encounter Discharge Information Details Patient Name: [name], [name] Date of Service: 11/11/2021 10:30 AM Medical Record Number: [id number] Patient Account Number: [id number] Date of Birth/Sex: Jul 13, 1966 (56 y.o. M) Treating RN: [name] Primary Care [name]: [name] Other Clinician: Jacqulyn [name] Referring [name]: [name] Treating Jaymond Waage/Extender: [name] in Treatment:  1 Encounter Discharge Information Items Discharge Condition: Stable Ambulatory Status: Ambulatory Discharge Destination: Home Transportation: Private Auto Accompanied By: self Schedule Follow-up Appointment: Yes Clinical Summary of Care: Notes Patient has an HBO treatment scheduled on 11/12/21 at 09:00 am. Electronic Signature(s) Signed: 11/11/2021 2:27:39 PM By: [name] RCP, RRT, CHT Entered By: [name], [name] on 11/11/2021 14:26:54 [name], [name] ([id number]) -------------------------------------------------------------------------------- Vitals Details Patient Name: [name], [name] Date of Service: 11/11/2021 10:30 AM Medical Record Number: [id number] Patient Account Number: [id number] Date of Birth/Sex: 22-Sep-1966 (56 y.o. M) Treating RN: [name] Primary Care [name]: [name] Other Clinician: Jacqulyn [name] Referring [name]: [name] Treating [name]/Extender: [name] in Treatment: 1 Vital Signs Time Taken: 10:45 Temperature (F): 98.5 Height (in): 70 Pulse (bpm): 72 Weight (lbs): 180.4 Respiratory Rate (breaths/min): 16 Body Mass Index (BMI): 25.9 Blood Pressure (mmHg): 124/78 Capillary Blood Glucose (mg/dl): 157 Reference Range: 80 - 120 mg / dl Electronic Signature(s) Signed: 11/11/2021 2:27:39 PM By: [name] RCP, RRT, CHT Entered By: [name] on 11/11/2021 11:15:27

## 2021-11-11 NOTE — Progress Notes (Signed)
[place], [place] ([id number]) Visit Report for 11/11/2021 Problem List Details Patient Name: [name], [name]. Date of Service: 11/11/2021 10:30 AM Medical Record Number: [id number] Patient Account Number: [id number] Date of Birth/Sex: 1966-08-01 (56 y.o. 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 Date of Service: 11/11/2021 Medical Record Number: [id number] Patient Account Number: [id number] Date of Birth/Sex: 21-Jun-1966 (56 y.o. 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 Facility Procedures CPT4 Code: 24462863 Description: (Facility Use Only) HBOT, full body chamber, 8min Modifier: Quantity: 4 Physician Procedures CPT4 Code: 8177116 Description: 57903 - WC PHYS HYPERBARIC OXYGEN THERAPY Modifier: Quantity: 1 CPT4 Code: Description: ICD-10 Diagnosis Description M86.672 Other chronic osteomyelitis, 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

## 2021-11-12 ENCOUNTER — Encounter: Payer: Medicare Other | Admitting: Physician Assistant

## 2021-11-12 DIAGNOSIS — L97524 Non-pressure chronic ulcer of other part of left foot with necrosis of bone: Secondary | ICD-10-CM | POA: Diagnosis not present

## 2021-11-12 DIAGNOSIS — B952 Enterococcus as the cause of diseases classified elsewhere: Secondary | ICD-10-CM | POA: Diagnosis not present

## 2021-11-12 DIAGNOSIS — I2510 Atherosclerotic heart disease of native coronary artery without angina pectoris: Secondary | ICD-10-CM | POA: Diagnosis not present

## 2021-11-12 DIAGNOSIS — L03116 Cellulitis of left lower limb: Secondary | ICD-10-CM | POA: Diagnosis not present

## 2021-11-12 DIAGNOSIS — E11621 Type 2 diabetes mellitus with foot ulcer: Secondary | ICD-10-CM | POA: Diagnosis not present

## 2021-11-12 DIAGNOSIS — E1151 Type 2 diabetes mellitus with diabetic peripheral angiopathy without gangrene: Secondary | ICD-10-CM | POA: Diagnosis not present

## 2021-11-12 DIAGNOSIS — B957 Other staphylococcus as the cause of diseases classified elsewhere: Secondary | ICD-10-CM | POA: Diagnosis not present

## 2021-11-12 DIAGNOSIS — I5042 Chronic combined systolic (congestive) and diastolic (congestive) heart failure: Secondary | ICD-10-CM | POA: Diagnosis not present

## 2021-11-12 DIAGNOSIS — M86672 Other chronic osteomyelitis, left ankle and foot: Secondary | ICD-10-CM | POA: Diagnosis not present

## 2021-11-12 DIAGNOSIS — M86172 Other acute osteomyelitis, left ankle and foot: Secondary | ICD-10-CM | POA: Diagnosis not present

## 2021-11-12 DIAGNOSIS — I110 Hypertensive heart disease with heart failure: Secondary | ICD-10-CM | POA: Diagnosis not present

## 2021-11-12 DIAGNOSIS — Z4781 Encounter for orthopedic aftercare following surgical amputation: Secondary | ICD-10-CM | POA: Diagnosis not present

## 2021-11-12 LAB — GLUCOSE, CAPILLARY: Glucose-Capillary: 137 mg/dL — ABNORMAL HIGH (ref 70–99)

## 2021-11-12 NOTE — Progress Notes (Signed)
[place], [place] ([id number]) Visit Report for 11/12/2021 Arrival Information Details Patient Name: [name], [name]. Date of Service: 11/12/2021 9:00 AM Medical Record Number: [id number] Patient Account Number: [id number] Date of Birth/Sex: 25-Mar-1966 (56 y.o. M) Treating RN: Cornell Barman Primary Care Neyla Gauntt: [name] Other Clinician: Jacqulyn Bath Referring Kahlel Peake: [name] Treating Milo Schreier/Extender: [name] in Treatment: 1 Visit Information History Since Last Visit Added or deleted any medications: No Patient Arrived: Ambulatory Any new allergies or adverse reactions: No Arrival Time: 09:04 Had a fall or experienced change in No Accompanied By: self activities of daily living that may affect Transfer Assistance: None risk of falls: Patient Identification Verified: Yes Signs or symptoms of abuse/neglect since last visito No Secondary Verification Process Completed: Yes Hospitalized since last visit: No Patient Requires Transmission-Based No Implantable device outside of the clinic excluding No Precautions: cellular tissue based products placed in the center Patient Has Alerts: Yes since last visit: Patient Alerts: Patient on Blood Has Dressing in Place as Prescribed: Yes Thinner Has Footwear/Offloading in Place as Prescribed: Yes DIALYSIS RIGHT ARM Left: Surgical Shoe with Pressure Relief Insole Pain Present Now: No Electronic Signature(s) Signed: 11/12/2021 12:09:28 PM By: Enedina Finner RCP, RRT, CHT Entered By: Stark Jock, Amado Nash on 11/12/2021 09:04:54 Clinch, Rally L. ([id number]) -------------------------------------------------------------------------------- Encounter Discharge Information Details Patient Name: [name], [name] Date of Service: 11/12/2021 9:00 AM Medical Record Number: [id number] Patient Account Number: [id number] Date of Birth/Sex: 12/09/1965 (56 y.o. M) Treating RN: Cornell Barman Primary Care Keonta Alsip: [name] Other Clinician: Jacqulyn Bath Referring Taci Sterling: [name] Treating Khristi Schiller/Extender: [name] in Treatment: 1 Encounter Discharge Information Items Discharge Condition: Stable Ambulatory Status: Ambulatory Discharge Destination: Home Transportation: Private Auto Accompanied By: self Schedule Follow-up Appointment: Yes Clinical Summary of Care: Notes Patient has an HBO treatment scheduled on 11/13/21 at 10:30 am. Electronic Signature(s) Signed: 11/12/2021 12:09:28 PM By: Enedina Finner RCP, RRT, CHT Entered By: Stark Jock, Amado Nash on 11/12/2021 12:05:04 [name], [name] ([id number]) -------------------------------------------------------------------------------- Vitals Details Patient Name: [name], [name] Date of Service: 11/12/2021 9:00 AM Medical Record Number: [id number] Patient Account Number: [id number] Date of Birth/Sex: 02/02/1966 (56 y.o. M) Treating RN: Cornell Barman Primary Care Gopal Malter: [name] Other Clinician: Jacqulyn Bath Referring Manvir Prabhu: [name] Treating Yamel Bale/Extender: [name] in Treatment: 1 Vital Signs Time Taken: 08:56 Temperature (F): 98.5 Height (in): 70 Pulse (bpm): 90 Weight (lbs): 180.4 Respiratory Rate (breaths/min): 16 Body Mass Index (BMI): 25.9 Blood Pressure (mmHg): 138/72 Capillary Blood Glucose (mg/dl): 137 Reference Range: 80 - 120 mg / dl Electronic Signature(s) Signed: 11/12/2021 12:09:28 PM By: Enedina Finner RCP, RRT, CHT Entered By: Enedina Finner on 11/12/2021 09:06:21

## 2021-11-13 ENCOUNTER — Other Ambulatory Visit: Payer: Self-pay

## 2021-11-13 ENCOUNTER — Encounter (HOSPITAL_BASED_OUTPATIENT_CLINIC_OR_DEPARTMENT_OTHER): Payer: Medicare Other | Admitting: Internal Medicine

## 2021-11-13 DIAGNOSIS — M86672 Other chronic osteomyelitis, left ankle and foot: Secondary | ICD-10-CM

## 2021-11-13 DIAGNOSIS — I96 Gangrene, not elsewhere classified: Secondary | ICD-10-CM | POA: Diagnosis not present

## 2021-11-13 DIAGNOSIS — E11621 Type 2 diabetes mellitus with foot ulcer: Secondary | ICD-10-CM

## 2021-11-13 DIAGNOSIS — I5042 Chronic combined systolic (congestive) and diastolic (congestive) heart failure: Secondary | ICD-10-CM | POA: Diagnosis not present

## 2021-11-13 DIAGNOSIS — D631 Anemia in chronic kidney disease: Secondary | ICD-10-CM | POA: Diagnosis not present

## 2021-11-13 DIAGNOSIS — Z992 Dependence on renal dialysis: Secondary | ICD-10-CM | POA: Diagnosis not present

## 2021-11-13 DIAGNOSIS — I110 Hypertensive heart disease with heart failure: Secondary | ICD-10-CM | POA: Diagnosis not present

## 2021-11-13 DIAGNOSIS — N2581 Secondary hyperparathyroidism of renal origin: Secondary | ICD-10-CM | POA: Diagnosis not present

## 2021-11-13 DIAGNOSIS — L97524 Non-pressure chronic ulcer of other part of left foot with necrosis of bone: Secondary | ICD-10-CM

## 2021-11-13 DIAGNOSIS — N186 End stage renal disease: Secondary | ICD-10-CM | POA: Diagnosis not present

## 2021-11-13 DIAGNOSIS — I2510 Atherosclerotic heart disease of native coronary artery without angina pectoris: Secondary | ICD-10-CM | POA: Diagnosis not present

## 2021-11-13 LAB — GLUCOSE, CAPILLARY
Glucose-Capillary: 117 mg/dL — ABNORMAL HIGH (ref 70–99)
Glucose-Capillary: 103 mg/dL — ABNORMAL HIGH (ref 70–99)
Glucose-Capillary: 120 mg/dL — ABNORMAL HIGH (ref 70–99)
Glucose-Capillary: 155 mg/dL — ABNORMAL HIGH (ref 70–99)
Glucose-Capillary: 104 mg/dL — ABNORMAL HIGH (ref 70–99)

## 2021-11-13 NOTE — Progress Notes (Signed)
[name], [name] ([id number]) Visit Report for 11/12/2021 HBO Details Patient Name: [name], [name] Date of Service: 11/12/2021 9:00 AM Medical Record Number: [id number] Patient Account Number: [id number] Date of Birth/Sex: 09-17-1966 (56 y.o. 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 HBO Treatment Details Treatment Number: 5 Patient Type: Outpatient Chamber Type: Monoplace Chamber Serial #: [id number] Treatment Protocol: 2.0 ATA with 90 minutes oxygen, and no air breaks Treatment Details Compression Rate Down: 1.5 psi / minute De-Compression Rate Up: 1.5 psi / minute Compress Tx Pressure Air breaks and breathing periods Decompress Decompress Begins Reached (leave unused spaces blank) Begins Ends Chamber Pressure (ATA) 1 2 - - - - - - 2 1 Clock Time (24 hr) 09:14 09:26 - - - - - - 10:56 11:06 Treatment Length: 112 (minutes) Treatment Segments: 4 Vital Signs Capillary Blood Glucose Reference Range: 80 - 120 mg / dl HBO Diabetic Blood Glucose Intervention Range: <131 mg/dl or >249 mg/dl Time Vitals Blood Respiratory Capillary Blood Glucose Pulse Action Type: Pulse: Temperature: Taken: Pressure: Rate: Glucose (mg/dl): Meter #: Oximetry (%) Taken: Pre 08:56 138/72 90 16 98.5 137 1 none per protocol Post 11:11 130/76 72 16 98.2 117 1 none per protocol Treatment Response Treatment Toleration: Well Treatment Completion Treatment Completed without Adverse Event Status: HBO Attestation I certify that I supervised this HBO treatment in accordance with Medicare guidelines. A trained emergency response team is readily  Yes available per hospital policies and procedures. Continue HBOT as ordered. Yes Electronic Signature(s) Signed: 11/12/2021 6:20:32 PM By: Worthy Keeler PA-C Previous Signature: 11/12/2021 12:09:28 PM Version By: Enedina Finner RCP, RRT, CHT Entered By: Worthy Keeler on 11/12/2021 18:20:32 Orea, Deundra L. ([id number]) -------------------------------------------------------------------------------- HBO Safety Checklist Details Patient Name: [name], [name] Date of Service: 11/12/2021 9:00 AM Medical Record Number: [id number] Patient Account Number: [id number] Date of Birth/Sex: December 26, 1965 (56 y.o. M) Treating RN: Cornell Barman Primary Care Melonee Gerstel: [name] Other Clinician: Jacqulyn Bath Referring Ritamarie Arkin: [name] Treating [place] Sloop/Extender: [name] in Treatment: 1 HBO Safety Checklist Items Safety Checklist Consent Form Signed Patient voided / foley secured and emptied When did you last eato 0845 am Last dose of injectable or oral agent n/a Ostomy pouch emptied and vented if applicable NA All implantable devices assessed, documented and approved NA Intravenous access site secured and place NA Valuables secured Linens and cotton and cotton/polyester blend (less than 51% polyester) Personal oil-based products / skin lotions / body lotions removed Wigs or hairpieces removed NA Smoking or tobacco materials removed NA Books / newspapers / magazines / loose paper removed NA Cologne, aftershave, perfume and deodorant removed Jewelry removed (may wrap wedding band) Make-up removed NA Hair care products removed Battery operated devices (external) removed NPWT pump removed Heating patches and chemical warmers removed NA Titanium eyewear removed NA Nail polish cured greater than 10 hours NA Casting material cured greater than 10 hours NA Hearing aids removed NA Loose dentures or partials removed NA Prosthetics have been  removed NA Patient demonstrates correct use of air break device (if applicable) Patient concerns have been addressed Patient grounding bracelet on and cord attached to chamber Specifics for Inpatients (complete in addition to above) Medication sheet sent with patient Intravenous medications  needed or due during therapy sent with patient Drainage tubes (e.g. nasogastric tube or chest tube secured and vented) Endotracheal or Tracheotomy tube secured Cuff deflated of air and inflated with saline Airway suctioned Electronic Signature(s) Signed: 11/12/2021 12:09:28 PM By: Enedina Finner RCP, RRT, CHT Entered By: Enedina Finner on 11/12/2021 09:15:37

## 2021-11-13 NOTE — Progress Notes (Signed)
[name] ([id number]) Visit Report for 11/13/2021 Arrival Information Details Patient Name: [name], [name]. Date of Service: 11/13/2021 10:30 AM Medical Record Number: [id number] Patient Account Number: [id number] Date of Birth/Sex: 10-07-1966 (56 y.o. M) Treating RN: [name] Primary Care [name]: [name] Other Clinician: Jacqulyn [name] Referring [name]: [name] Treating [name]/Extender: [name] in Treatment: 1 Visit Information History Since Last Visit Added or deleted any medications: No Patient Arrived: Ambulatory Any new allergies or adverse reactions: No Arrival Time: 10:30 Had a fall or experienced change in No Accompanied By: self activities of daily living that may affect Transfer Assistance: None risk of falls: Patient Identification Verified: Yes Signs or symptoms of abuse/neglect since last visito No Secondary Verification Process Completed: Yes Hospitalized since last visit: No Patient Requires Transmission-Based No Implantable device outside of the clinic excluding No Precautions: cellular tissue based products placed in the center Patient Has Alerts: Yes since last visit: Patient Alerts: Patient on Blood Has Dressing in Place as Prescribed: Yes Thinner Has Footwear/Offloading in Place as Prescribed: Yes DIALYSIS RIGHT ARM Left: Surgical Shoe with Pressure Relief Insole Pain Present Now: No Electronic Signature(s) Signed: 11/13/2021 2:03:58 PM By: [name] RCP, RRT, CHT Entered By: [name], [name] on 11/13/2021 12:07:42 [name], [name] ([id number]) -------------------------------------------------------------------------------- Encounter Discharge Information Details Patient Name: [name], [name] Date of Service: 11/13/2021 10:30 AM Medical Record Number: [id number] Patient Account Number: [id number] Date of Birth/Sex: March 30, 1966 (56 y.o. M) Treating RN: [name] Primary Care [name]:  [name] Other Clinician: Jacqulyn [name] Referring [name]: [name] Treating [name]/Extender: [name] in Treatment: 1 Encounter Discharge Information Items Discharge Condition: Stable Ambulatory Status: Ambulatory Discharge Destination: Home Transportation: Private Auto Accompanied By: self Schedule Follow-up Appointment: No Clinical Summary of Care: Notes Patient has an HBO treatment scheduled on 11/14/21 at 08:00 am. Electronic Signature(s) Signed: 11/13/2021 2:03:58 PM By: [name] RCP, RRT, CHT Entered By: [name], [name] on 11/13/2021 13:51:58 [name], [name] ([id number]) -------------------------------------------------------------------------------- Vitals Details Patient Name: [name], [name] Date of Service: 11/13/2021 10:30 AM Medical Record Number: [id number] Patient Account Number: [id number] Date of Birth/Sex: 04-15-1966 (56 y.o. M) Treating RN: [name] Primary Care Aren Pryde: [name] Other Clinician: Jacqulyn [name] Referring [name]: [name] Treating [name]/Extender: [name] in Treatment: 1 Vital Signs Time Taken: 10:44 Temperature (F): 97.9 Height (in): 70 Pulse (bpm): 84 Weight (lbs): 180.4 Respiratory Rate (breaths/min): 16 Body Mass Index (BMI): 25.9 Blood Pressure (mmHg): 138/78 Capillary Blood Glucose (mg/dl): 120 Reference Range: 80 - 120 mg / dl Electronic Signature(s) Signed: 11/13/2021 2:03:58 PM By: [name] RCP, RRT, CHT Entered By: [name] on 11/13/2021 11:11:44

## 2021-11-13 NOTE — Progress Notes (Signed)
[name], [name] ([id number]) Visit Report for 11/13/2021 HBO Details Patient Name: [name], [name] Date of Service: 11/13/2021 10:30 AM Medical Record Number: [id number] Patient Account Number: [id number] Date of Birth/Sex: 11/17/1965 (56 y.o. 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 HBO Treatment Details Treatment Number: 6 Patient Type: Outpatient Chamber Type: Monoplace Chamber Serial #: [id number] Treatment Protocol: 2.0 ATA with 90 minutes oxygen, and no air breaks Treatment Details Compression Rate Down: 1.0 psi / minute De-Compression Rate Up: 1.5 psi / minute Compress Tx Pressure Air breaks and breathing periods Decompress Decompress Begins Reached (leave unused spaces blank) Begins Ends Chamber Pressure (ATA) 1 2 - - - - - - 2 1 Clock Time (24 hr) 11:31 11:48 - - - - - - 13:19 13:29 Treatment Length: 118 (minutes) Treatment Segments: 4 Vital Signs Capillary Blood Glucose Reference Range: 80 - 120 mg / dl HBO Diabetic Blood Glucose Intervention Range: <131 mg/dl or >249 mg/dl Time Capillary Blood Glucose Pulse Blood Respiratory Action Type: Vitals Pulse: Temperature: Glucose Meter Oximetry Pressure: Rate: Taken: Taken: (mg/dl): #: (%) Pre 10:44 138/78 84 16 97.9 120 1 see notes below proceeded with HBO per Pre 11:22 155 1 protocol Post 13:34 126/74 90 16 98.6 104 1 none per protocol Treatment Response Treatment Toleration: Well Treatment Completion Treatment Completed without Adverse Event Status: HBO Attestation I certify that I supervised this HBO treatment in accordance with Medicare  guidelines. A trained emergency response team is readily Yes available per hospital policies and procedures. Continue HBOT as ordered. Yes Electronic Signature(s) Signed: 11/13/2021 3:57:52 PM By: [name] Shan DO Previous Signature: 11/13/2021 2:03:58 PM Version By: Enedina Finner RCP, RRT, CHT Entered By: [name] on 11/13/2021 15:57:51 [name], [name] ([id number]) -------------------------------------------------------------------------------- HBO Safety Checklist Details Patient Name: [name], [name] Date of Service: 11/13/2021 10:30 AM Medical Record Number: [id number] Patient Account Number: [id number] Date of Birth/Sex: 11-13-1965 (56 y.o. M) Treating RN: [name] Primary Care [name]: [name] Other Clinician: Jacqulyn [name] Referring [name]: [name] Treating [name]/Extender: [name] in Treatment: 1 HBO Safety Checklist Items Safety Checklist Consent Form Signed Patient voided / foley secured and emptied When did you last eato 10:30 am Last dose of injectable or oral agent n/a Ostomy pouch emptied and vented if applicable NA All implantable devices assessed, documented and approved NA Intravenous access site secured and place NA Valuables secured Linens and cotton and cotton/polyester blend (less than 51% polyester) Personal oil-based products / skin lotions / body lotions removed Wigs or hairpieces removed NA Smoking or tobacco materials removed NA Books / newspapers / magazines / loose paper removed NA Cologne, aftershave, perfume and deodorant removed Jewelry removed (may wrap wedding band) Make-up removed NA Hair care products removed Battery operated devices (external) removed NPWT pump Heating patches and chemical warmers removed NA Titanium eyewear removed NA Nail polish cured greater than 10 hours NA Casting material cured greater than 10 hours NA Hearing aids removed NA Loose dentures or  partials removed NA Prosthetics have been removed NA Patient demonstrates correct use of air break device (if applicable) Patient concerns have been addressed Patient grounding bracelet on and cord attached to chamber Specifics for Inpatients (complete in addition to above) Medication  sheet sent with patient Intravenous medications needed or due during therapy sent with patient Drainage tubes (e.g. nasogastric tube or chest tube secured and vented) Endotracheal or Tracheotomy tube secured Cuff deflated of air and inflated with saline Airway suctioned Electronic Signature(s) Signed: 11/13/2021 2:03:58 PM By: Enedina Finner RCP, RRT, CHT Entered By: Enedina Finner on 11/13/2021 12:09:52

## 2021-11-14 ENCOUNTER — Encounter: Payer: Medicare Other | Admitting: Physician Assistant

## 2021-11-14 DIAGNOSIS — I110 Hypertensive heart disease with heart failure: Secondary | ICD-10-CM | POA: Diagnosis not present

## 2021-11-14 DIAGNOSIS — I5042 Chronic combined systolic (congestive) and diastolic (congestive) heart failure: Secondary | ICD-10-CM | POA: Diagnosis not present

## 2021-11-14 DIAGNOSIS — M86672 Other chronic osteomyelitis, left ankle and foot: Secondary | ICD-10-CM | POA: Diagnosis not present

## 2021-11-14 DIAGNOSIS — L97524 Non-pressure chronic ulcer of other part of left foot with necrosis of bone: Secondary | ICD-10-CM | POA: Diagnosis not present

## 2021-11-14 DIAGNOSIS — E11621 Type 2 diabetes mellitus with foot ulcer: Secondary | ICD-10-CM | POA: Diagnosis not present

## 2021-11-14 DIAGNOSIS — I2510 Atherosclerotic heart disease of native coronary artery without angina pectoris: Secondary | ICD-10-CM | POA: Diagnosis not present

## 2021-11-14 LAB — GLUCOSE, CAPILLARY
Glucose-Capillary: 176 mg/dL — ABNORMAL HIGH (ref 70–99)
Glucose-Capillary: 84 mg/dL (ref 70–99)

## 2021-11-14 NOTE — Progress Notes (Signed)
[place], [place] ([id number]) Visit Report for 11/14/2021 Arrival Information Details Patient Name: [name], [name]. Date of Service: 11/14/2021 8:00 AM Medical Record Number: [id number] Patient Account Number: [id number] Date of Birth/Sex: [birth year] (56 y.o. M) Treating RN: Donnamarie Poag Primary Care Lynix Bonine: [name] Other Clinician: Jacqulyn Bath Referring Diante Barley: [name] Treating Briteny Fulghum/Extender: [name] in Treatment: 2 Visit Information History Since Last Visit Added or deleted any medications: No Patient Arrived: Ambulatory Any new allergies or adverse reactions: No Arrival Time: 07:50 Had a fall or experienced change in No Accompanied By: self activities of daily living that may affect Transfer Assistance: None risk of falls: Patient Identification Verified: Yes Signs or symptoms of abuse/neglect since last visito No Secondary Verification Process Completed: Yes Hospitalized since last visit: No Patient Requires Transmission-Based No Implantable device outside of the clinic excluding No Precautions: cellular tissue based products placed in the center Patient Has Alerts: Yes since last visit: Patient Alerts: Patient on Blood Has Dressing in Place as Prescribed: Yes Thinner Has Footwear/Offloading in Place as Prescribed: Yes DIALYSIS RIGHT ARM Left: Surgical Shoe with Pressure Relief Insole Pain Present Now: No Electronic Signature(s) Signed: 11/14/2021 10:49:49 AM By: Enedina Finner RCP, RRT, CHT Entered By: Stark Jock, Amado Nash on 11/14/2021 08:26:56 [name], [name] ([id number]) -------------------------------------------------------------------------------- Encounter Discharge Information Details Patient Name: [name], [name] Date of Service: 11/14/2021 8:00 AM Medical Record Number: [id number] Patient Account Number: [id number] Date of Birth/Sex: 01-06-1966 (56 y.o. M) Treating RN: Donnamarie Poag Primary Care Jackson Coffield: [name] Other Clinician: Jacqulyn Bath Referring Hardeep Reetz: [name] Treating Janin Kozlowski/Extender: [name] in Treatment: 2 Encounter Discharge Information Items Discharge Condition: Stable Ambulatory Status: Ambulatory Discharge Destination: Home Transportation: Private Auto Accompanied By: self Schedule Follow-up Appointment: No Clinical Summary of Care: Notes Patient has an HBO treatment scheduled on 11/15/21 at 10:30 am. Electronic Signature(s) Signed: 11/14/2021 10:49:49 AM By: Enedina Finner RCP, RRT, CHT Entered By: Enedina Finner on 11/14/2021 10:49:19 [name], [name] ([id number]) -------------------------------------------------------------------------------- Vitals Details Patient Name: [name], [name] Date of Service: 11/14/2021 8:00 AM Medical Record Number: [id number] Patient Account Number: [id number] Date of Birth/Sex: Dec 21, 1965 (56 y.o. M) Treating RN: Donnamarie Poag Primary Care Wilmarie Sparlin: [name] Other Clinician: Jacqulyn Bath Referring Haliegh Khurana: [name] Treating Olman Yono/Extender: [name] in Treatment: 2 Vital Signs Time Taken: 07:57 Temperature (F): 97.9 Height (in): 70 Pulse (bpm): 72 Weight (lbs): 180.4 Respiratory Rate (breaths/min): 16 Body Mass Index (BMI): 25.9 Blood Pressure (mmHg): 132/72 Capillary Blood Glucose (mg/dl): 176 Reference Range: 80 - 120 mg / dl Electronic Signature(s) Signed: 11/14/2021 10:49:49 AM By: Enedina Finner RCP, RRT, CHT Entered By: Enedina Finner on 11/14/2021 08:17:07

## 2021-11-15 ENCOUNTER — Other Ambulatory Visit: Payer: Self-pay

## 2021-11-15 ENCOUNTER — Encounter: Payer: Medicare Other | Admitting: Physician Assistant

## 2021-11-15 DIAGNOSIS — E11621 Type 2 diabetes mellitus with foot ulcer: Secondary | ICD-10-CM | POA: Diagnosis not present

## 2021-11-15 DIAGNOSIS — M86672 Other chronic osteomyelitis, left ankle and foot: Secondary | ICD-10-CM | POA: Diagnosis not present

## 2021-11-15 DIAGNOSIS — L03116 Cellulitis of left lower limb: Secondary | ICD-10-CM | POA: Diagnosis not present

## 2021-11-15 DIAGNOSIS — I110 Hypertensive heart disease with heart failure: Secondary | ICD-10-CM | POA: Diagnosis not present

## 2021-11-15 DIAGNOSIS — L97524 Non-pressure chronic ulcer of other part of left foot with necrosis of bone: Secondary | ICD-10-CM | POA: Diagnosis not present

## 2021-11-15 DIAGNOSIS — I5042 Chronic combined systolic (congestive) and diastolic (congestive) heart failure: Secondary | ICD-10-CM | POA: Diagnosis not present

## 2021-11-15 DIAGNOSIS — D631 Anemia in chronic kidney disease: Secondary | ICD-10-CM | POA: Diagnosis not present

## 2021-11-15 DIAGNOSIS — N2581 Secondary hyperparathyroidism of renal origin: Secondary | ICD-10-CM | POA: Diagnosis not present

## 2021-11-15 DIAGNOSIS — I2510 Atherosclerotic heart disease of native coronary artery without angina pectoris: Secondary | ICD-10-CM | POA: Diagnosis not present

## 2021-11-15 DIAGNOSIS — I96 Gangrene, not elsewhere classified: Secondary | ICD-10-CM | POA: Diagnosis not present

## 2021-11-15 DIAGNOSIS — N186 End stage renal disease: Secondary | ICD-10-CM | POA: Diagnosis not present

## 2021-11-15 DIAGNOSIS — Z992 Dependence on renal dialysis: Secondary | ICD-10-CM | POA: Diagnosis not present

## 2021-11-15 LAB — GLUCOSE, CAPILLARY
Glucose-Capillary: 132 mg/dL — ABNORMAL HIGH (ref 70–99)
Glucose-Capillary: 131 mg/dL — ABNORMAL HIGH (ref 70–99)

## 2021-11-15 NOTE — Progress Notes (Addendum)
[place], [place] ([id number]) Visit Report for 11/14/2021 HBO Details Patient Name: Ehresman, Jamahl L. Date of Service: 11/14/2021 8:00 AM Medical Record Number: [id number] Patient Account Number: [id number] Date of Birth/Sex: 09-Jan-1966 (56 y.o. 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 HBO Treatment Details Treatment Number: 7 Patient Type: Outpatient Chamber Type: Monoplace Chamber Serial #: [id number] Treatment Protocol: 2.0 ATA with 90 minutes oxygen, and no air breaks Treatment Details Compression Rate Down: 1.5 psi / minute De-Compression Rate Up: 1.5 psi / minute Compress Tx Pressure Air breaks and breathing periods Decompress Decompress Begins Reached (leave unused spaces blank) Begins Ends Chamber Pressure (ATA) 1 2 - - - - - - 2 1 Clock Time (24 hr) 08:13 08:25 - - - - - - 09:55 10:05 Treatment Length: 112 (minutes) Treatment Segments: 4 Vital Signs Capillary Blood Glucose Reference Range: 80 - 120 mg / dl HBO Diabetic Blood Glucose Intervention Range: <131 mg/dl or >249 mg/dl Time Vitals Blood Respiratory Capillary Blood Glucose Pulse Action Type: Pulse: Temperature: Taken: Pressure: Rate: Glucose (mg/dl): Meter #: Oximetry (%) Taken: Pre 07:57 132/72 72 16 97.9 176 1 none per protocol Post 10:10 122/76 72 16 98.4 84 1 see Arjun Hard's note Treatment Response Treatment Toleration: Well Treatment Completion Treatment Completed without Adverse Event Status: Courteney Alderete Notes Of note this patient is not on any medications for diabetes and though he has a formal diagnosis of diabetes he since lost over half of his  body weight that he was at that time down to good size. Again he is no longer taking any medications and his blood sugars are actually doing quite well. We have a very hard time keeping his blood sugars higher but again I do not think there is any problem here in fact I think this is probably a good thing and that his body is managing the sugars very well and again I do not see any major complication here. I Georgina Peer have him go ahead and continue to eat a good meal before he goes in for HBO again I do not want to make himself sick but at least have something on board and then if everything continues to do well and it comes out without any significant drops to the point to where he feels bad, shaky, or has otherwise signs of hypoglycemia we may even stop monitoring this since he technically does not have active diabetes at this point. HBO Attestation I certify that I supervised this HBO treatment in accordance with Medicare guidelines. A trained emergency response team is readily Yes available per hospital policies and procedures. Continue HBOT as ordered. Yes Electronic Signature(s) Signed: 11/14/2021 6:10:24 PM By: Worthy Keeler PA-C Previous Signature: 11/14/2021 10:49:49 AM Version By: Enedina Finner RCP, RRT, CHT Previous Signature: 11/14/2021 6:09:14 PM Version By: Evangeline Gula, [place] ([id number]) Entered By: Worthy Keeler on 11/14/2021 18:10:24 [place], [place] ([id number]) -------------------------------------------------------------------------------- HBO Safety Checklist Details Patient Name: Kunzman, Basilio L. Date of Service: 11/14/2021 8:00 AM Medical Record Number: [id number] Patient Account Number: [id number] Date of Birth/Sex: [birth year] (56 y.o. M) Treating RN: Donnamarie Poag Primary Care Latanga Nedrow: [name] Other Clinician: Jacqulyn Bath Referring Cher Franzoni: [name] Treating Zanobia Griebel/Extender: [name] in  Treatment: 2 HBO Safety  Checklist Items Safety Checklist Consent Form Signed Patient voided / foley secured and emptied When did you last eato 06:30 am Last dose of injectable or oral agent n/a Ostomy pouch emptied and vented if applicable NA All implantable devices assessed, documented and approved NA Intravenous access site secured and place NA Valuables secured Linens and cotton and cotton/polyester blend (less than 51% polyester) Personal oil-based products / skin lotions / body lotions removed Wigs or hairpieces removed NA Smoking or tobacco materials removed NA Books / newspapers / magazines / loose paper removed NA Cologne, aftershave, perfume and deodorant removed Jewelry removed (may wrap wedding band) Make-up removed NA Hair care products removed Battery operated devices (external) removed NPWT pump Heating patches and chemical warmers removed NA Titanium eyewear removed NA Nail polish cured greater than 10 hours NA Casting material cured greater than 10 hours NA Hearing aids removed NA Loose dentures or partials removed NA Prosthetics have been removed NA Patient demonstrates correct use of air break device (if applicable) Patient concerns have been addressed Patient grounding bracelet on and cord attached to chamber Specifics for Inpatients (complete in addition to above) Medication sheet sent with patient Intravenous medications needed or due during therapy sent with patient Drainage tubes (e.g. nasogastric tube or chest tube secured and vented) Endotracheal or Tracheotomy tube secured Cuff deflated of air and inflated with saline Airway suctioned Electronic Signature(s) Signed: 11/14/2021 10:49:49 AM By: Enedina Finner RCP, RRT, CHT Entered By: Enedina Finner on 11/14/2021 08:18:29

## 2021-11-15 NOTE — Progress Notes (Signed)
[place], [place] ([id number]) Visit Report for 11/15/2021 Arrival Information Details Patient Name: [name], [name]. Date of Service: 11/15/2021 10:30 AM Medical Record Number: [id number] Patient Account Number: [id number] Date of Birth/Sex: October 30, 1965 (56 y.o. M) Treating RN: Donnamarie Poag Primary Care Martavion Couper: [name] Other Clinician: Jacqulyn Bath Referring Green Quincy: [name] Treating Rosey Eide/Extender: [name] in Treatment: 2 Visit Information History Since Last Visit Added or deleted any medications: No Patient Arrived: Ambulatory Any new allergies or adverse reactions: No Arrival Time: 10:32 Had a fall or experienced change in No Accompanied By: self activities of daily living that may affect Transfer Assistance: None risk of falls: Patient Identification Verified: Yes Signs or symptoms of abuse/neglect since last visito No Secondary Verification Process Completed: Yes Hospitalized since last visit: No Patient Requires Transmission-Based No Implantable device outside of the clinic excluding No Precautions: cellular tissue based products placed in the center Patient Has Alerts: Yes since last visit: Patient Alerts: Patient on Blood Has Dressing in Place as Prescribed: Yes Thinner Has Footwear/Offloading in Place as Prescribed: Yes DIALYSIS RIGHT ARM Left: Surgical Shoe with Pressure Relief Insole Pain Present Now: No Electronic Signature(s) Signed: 11/15/2021 1:20:18 PM By: Enedina Finner RCP, RRT, CHT Entered By: Stark Jock, Amado Nash on 11/15/2021 10:42:02 [name], [name] ([id number]) -------------------------------------------------------------------------------- Encounter Discharge Information Details Patient Name: [name], [name] Date of Service: 11/15/2021 10:30 AM Medical Record Number: [id number] Patient Account Number: [id number] Date of Birth/Sex: 1966-01-06 (56 y.o. M) Treating RN: Donnamarie Poag Primary Care Prudie Guthridge: [name] Other Clinician: Jacqulyn Bath Referring Azia Toutant: [name] Treating Amour Cutrone/Extender: [name] in Treatment: 2 Encounter Discharge Information Items Discharge Condition: Stable Ambulatory Status: Ambulatory Discharge Destination: Home Transportation: Private Auto Accompanied By: self Schedule Follow-up Appointment: Yes Clinical Summary of Care: Notes Patient has an HBO treatment scheduled on 11/18/21 at 10:30. Electronic Signature(s) Signed: 11/15/2021 1:20:18 PM By: Enedina Finner RCP, RRT, CHT Entered By: Enedina Finner on 11/15/2021 13:19:52 [name], [name] ([id number]) -------------------------------------------------------------------------------- Vitals Details Patient Name: [name], [name] Date of Service: 11/15/2021 10:30 AM Medical Record Number: [id number] Patient Account Number: [id number] Date of Birth/Sex: Oct 30, 1965 (56 y.o. M) Treating RN: Donnamarie Poag Primary Care Bertha Lokken: [name] Other Clinician: Jacqulyn Bath Referring Treyvone Chelf: [name] Treating Monserat Prestigiacomo/Extender: [name] in Treatment: 2 Vital Signs Time Taken: 10:34 Temperature (F): 98.2 Height (in): 70 Pulse (bpm): 72 Weight (lbs): 180.4 Respiratory Rate (breaths/min): 16 Body Mass Index (BMI): 25.9 Blood Pressure (mmHg): 126/82 Capillary Blood Glucose (mg/dl): 132 Reference Range: 80 - 120 mg / dl Electronic Signature(s) Signed: 11/15/2021 1:20:18 PM By: Enedina Finner RCP, RRT, CHT Entered By: Enedina Finner on 11/15/2021 10:44:10

## 2021-11-16 DIAGNOSIS — B957 Other staphylococcus as the cause of diseases classified elsewhere: Secondary | ICD-10-CM | POA: Diagnosis not present

## 2021-11-16 DIAGNOSIS — B952 Enterococcus as the cause of diseases classified elsewhere: Secondary | ICD-10-CM | POA: Diagnosis not present

## 2021-11-16 DIAGNOSIS — Z4781 Encounter for orthopedic aftercare following surgical amputation: Secondary | ICD-10-CM | POA: Diagnosis not present

## 2021-11-16 DIAGNOSIS — M86172 Other acute osteomyelitis, left ankle and foot: Secondary | ICD-10-CM | POA: Diagnosis not present

## 2021-11-16 DIAGNOSIS — L03116 Cellulitis of left lower limb: Secondary | ICD-10-CM | POA: Diagnosis not present

## 2021-11-16 DIAGNOSIS — E1151 Type 2 diabetes mellitus with diabetic peripheral angiopathy without gangrene: Secondary | ICD-10-CM | POA: Diagnosis not present

## 2021-11-18 ENCOUNTER — Encounter: Payer: Medicare Other | Admitting: Physician Assistant

## 2021-11-18 ENCOUNTER — Other Ambulatory Visit: Payer: Self-pay

## 2021-11-18 DIAGNOSIS — N186 End stage renal disease: Secondary | ICD-10-CM | POA: Diagnosis not present

## 2021-11-18 DIAGNOSIS — I96 Gangrene, not elsewhere classified: Secondary | ICD-10-CM | POA: Diagnosis not present

## 2021-11-18 DIAGNOSIS — N2581 Secondary hyperparathyroidism of renal origin: Secondary | ICD-10-CM | POA: Diagnosis not present

## 2021-11-18 DIAGNOSIS — I110 Hypertensive heart disease with heart failure: Secondary | ICD-10-CM | POA: Diagnosis not present

## 2021-11-18 DIAGNOSIS — I5042 Chronic combined systolic (congestive) and diastolic (congestive) heart failure: Secondary | ICD-10-CM | POA: Diagnosis not present

## 2021-11-18 DIAGNOSIS — M86672 Other chronic osteomyelitis, left ankle and foot: Secondary | ICD-10-CM | POA: Diagnosis not present

## 2021-11-18 DIAGNOSIS — E11621 Type 2 diabetes mellitus with foot ulcer: Secondary | ICD-10-CM | POA: Diagnosis not present

## 2021-11-18 DIAGNOSIS — L97524 Non-pressure chronic ulcer of other part of left foot with necrosis of bone: Secondary | ICD-10-CM | POA: Diagnosis not present

## 2021-11-18 DIAGNOSIS — D631 Anemia in chronic kidney disease: Secondary | ICD-10-CM | POA: Diagnosis not present

## 2021-11-18 DIAGNOSIS — I2510 Atherosclerotic heart disease of native coronary artery without angina pectoris: Secondary | ICD-10-CM | POA: Diagnosis not present

## 2021-11-18 DIAGNOSIS — Z992 Dependence on renal dialysis: Secondary | ICD-10-CM | POA: Diagnosis not present

## 2021-11-18 LAB — GLUCOSE, CAPILLARY
Glucose-Capillary: 147 mg/dL — ABNORMAL HIGH (ref 70–99)
Glucose-Capillary: 113 mg/dL — ABNORMAL HIGH (ref 70–99)

## 2021-11-18 NOTE — Progress Notes (Signed)
[place], [place] ([id number]) Visit Report for 11/18/2021 HBO Details Patient Name: [name], [name] Date of Service: 11/18/2021 10:30 AM Medical Record Number: [id number] Patient Account Number: [id number] Date of Birth/Sex: 03/15/1966 (56 y.o. M) Treating RN: Donnamarie Poag Primary Care Bralynn Donado: [name] Other Clinician: Jacqulyn Bath Referring Levent Kornegay: [name] Treating Terez Freimark/Extender: [name] in Treatment: 2 HBO Treatment Course Details Treatment Course Number: 1 Ordering Jaquann Guarisco: Jeri Cos Total Treatments Ordered: 40 HBO Treatment Start Date: 11/04/2021 HBO Indication: Chronic Refractory Osteomyelitis to Left Foot HBO Treatment Details Treatment Number: 9 Patient Type: Outpatient Chamber Type: Monoplace Chamber Serial #: [id number] Treatment Protocol: 2.0 ATA with 90 minutes oxygen, and no air breaks Treatment Details Compression Rate Down: 1.5 psi / minute De-Compression Rate Up: 1.5 psi / minute Compress Tx Pressure Air breaks and breathing periods Decompress Decompress Begins Reached (leave unused spaces blank) Begins Ends Chamber Pressure (ATA) 1 2 - - - - - - 2 1 Clock Time (24 hr) 10:44 10:56 - - - - - - 12:26 12:36 Treatment Length: 112 (minutes) Treatment Segments: 4 Vital Signs Capillary Blood Glucose Reference Range: 80 - 120 mg / dl HBO Diabetic Blood Glucose Intervention Range: <131 mg/dl or >249 mg/dl Time Vitals Blood Respiratory Capillary Blood Glucose Pulse Action Type: Pulse: Temperature: Taken: Pressure: Rate: Glucose (mg/dl): Meter #: Oximetry (%) Taken: Pre 10:36 122/72 72 16 98.3 147 1 none per protocol Post 12:41 122/74 72 16 98.3 113 1 none per protocol Treatment Response Treatment Toleration: Well Treatment Completion Treatment Completed without Adverse Event Status: Electronic Signature(s) Signed: 11/18/2021 1:10:28 PM By: Zackery Barefoot, RRT, CHT Signed: 11/18/2021 5:27:53 PM By: Worthy Keeler  PA-C Entered By: Stark Jock, Amado Nash on 11/18/2021 13:09:07 Decoursey, Jamaurie L. ([id number]) -------------------------------------------------------------------------------- HBO Safety Checklist Details Patient Name: [name], [name] Date of Service: 11/18/2021 10:30 AM Medical Record Number: [id number] Patient Account Number: [id number] Date of Birth/Sex: Jun 29, 1966 (56 y.o. M) Treating RN: Donnamarie Poag Primary Care Aurore Redinger: [name] Other Clinician: Jacqulyn Bath Referring Shaniece Bussa: [name] Treating Lewanda Perea/Extender: [name] in Treatment: 2 HBO Safety Checklist Items Safety Checklist Consent Form Signed Patient voided / foley secured and emptied When did you last eato 10:00 Last dose of injectable or oral agent n/a Ostomy pouch emptied and vented if applicable NA All implantable devices assessed, documented and approved NA Intravenous access site secured and place NA Valuables secured Linens and cotton and cotton/polyester blend (less than 51% polyester) Personal oil-based products / skin lotions / body lotions removed Wigs or hairpieces removed NA Smoking or tobacco materials removed NA Books / newspapers / magazines / loose paper removed NA Cologne, aftershave, perfume and deodorant removed Jewelry removed (may wrap wedding band) Make-up removed NA Hair care products removed Battery operated devices (external) removed NPWT pump Heating patches and chemical warmers removed NA Titanium eyewear removed NA Nail polish cured greater than 10 hours NA Casting material cured greater than 10 hours NA Hearing aids removed NA Loose dentures or partials removed NA Prosthetics have been removed NA Patient demonstrates correct use of air break device (if applicable) Patient concerns have been addressed Patient grounding bracelet on and cord attached to chamber Specifics for Inpatients (complete in addition to above) Medication sheet sent with  patient Intravenous medications needed or due during therapy sent with patient Drainage tubes (e.g. nasogastric tube or chest tube secured and vented) Endotracheal or Tracheotomy tube secured Cuff deflated of air and inflated with saline Airway suctioned Electronic Signature(s) Signed: 11/18/2021  1:10:28 PM By: Enedina Finner RCP, RRT, CHT Entered By: Enedina Finner on 11/18/2021 10:55:53

## 2021-11-18 NOTE — Progress Notes (Signed)
[name], [name] ([id number]) Visit Report for 11/18/2021 Arrival Information Details Patient Name: [name]. Date of Service: 11/18/2021 10:30 AM Medical Record Number: [id number] Patient Account Number: [id number] Date of Birth/Sex: 17-Feb-1966 (56 y.o. M) Treating RN: Donnamarie Poag Primary Care [name]: [name] Other Clinician: Jacqulyn [name] Referring [name]: [name] Treating [name]/Extender: [name] in Treatment: 2 Visit Information History Since Last Visit Added or deleted any medications: No Patient Arrived: Ambulatory Any new allergies or adverse reactions: No Arrival Time: 10:20 Had a fall or experienced change in No Accompanied By: self activities of daily living that may affect Transfer Assistance: None risk of falls: Patient Identification Verified: Yes Signs or symptoms of abuse/neglect since last visito No Secondary Verification Process Completed: Yes Hospitalized since last visit: No Patient Requires Transmission-Based No Implantable device outside of the clinic excluding No Precautions: cellular tissue based products placed in the center Patient Has Alerts: Yes since last visit: Patient Alerts: Patient on Blood Has Dressing in Place as Prescribed: Yes Thinner Has Footwear/Offloading in Place as Prescribed: Yes DIALYSIS RIGHT ARM Left: Surgical Shoe with Pressure Relief Insole Pain Present Now: No Electronic Signature(s) Signed: 11/18/2021 1:10:28 PM By: [name] RCP, RRT, CHT Entered By: [name], [name] on 11/18/2021 10:52:08 [name], [name] ([id number]) -------------------------------------------------------------------------------- Encounter Discharge Information Details Patient Name: [name], [name] Date of Service: 11/18/2021 10:30 AM Medical Record Number: [id number] Patient Account Number: [id number] Date of Birth/Sex: [birth year] (56 y.o. M) Treating RN: Donnamarie Poag Primary Care [name]: [name] Other Clinician: Jacqulyn [name] Referring [name]: [name] Treating [name]/Extender: [name] in Treatment: 2 Encounter Discharge Information Items Discharge Condition: Stable Ambulatory Status: Ambulatory Discharge Destination: Home Transportation: Private Auto Accompanied By: self Schedule Follow-up Appointment: No Clinical Summary of Care: Notes Patient has an HBO treatment scheduled on 11/19/21 at 09:00 am. Electronic Signature(s) Signed: 11/18/2021 1:10:28 PM By: [name] RCP, RRT, CHT Entered By: [name], [name] on 11/18/2021 13:10:05 [name], [name] ([id number]) -------------------------------------------------------------------------------- Vitals Details Patient Name: [name], [name] Date of Service: 11/18/2021 10:30 AM Medical Record Number: [id number] Patient Account Number: [id number] Date of Birth/Sex: [birth year] (56 y.o. M) Treating RN: Donnamarie Poag Primary Care [name]: [name] Other Clinician: Jacqulyn [name] Referring [name]: [name] Treating [name]/Extender: [name] in Treatment: 2 Vital Signs Time Taken: 10:36 Temperature (F): 98.3 Height (in): 70 Pulse (bpm): 72 Weight (lbs): 180.4 Respiratory Rate (breaths/min): 16 Body Mass Index (BMI): 25.9 Blood Pressure (mmHg): 122/72 Capillary Blood Glucose (mg/dl): 147 Reference Range: 80 - 120 mg / dl Electronic Signature(s) Signed: 11/18/2021 1:10:28 PM By: [name] RCP, RRT, CHT Entered By: [name] on 11/18/2021 10:53:34

## 2021-11-19 ENCOUNTER — Encounter: Payer: Medicare Other | Admitting: Physician Assistant

## 2021-11-19 DIAGNOSIS — L97524 Non-pressure chronic ulcer of other part of left foot with necrosis of bone: Secondary | ICD-10-CM | POA: Diagnosis not present

## 2021-11-19 DIAGNOSIS — B957 Other staphylococcus as the cause of diseases classified elsewhere: Secondary | ICD-10-CM | POA: Diagnosis not present

## 2021-11-19 DIAGNOSIS — M86672 Other chronic osteomyelitis, left ankle and foot: Secondary | ICD-10-CM | POA: Diagnosis not present

## 2021-11-19 DIAGNOSIS — I2510 Atherosclerotic heart disease of native coronary artery without angina pectoris: Secondary | ICD-10-CM | POA: Diagnosis not present

## 2021-11-19 DIAGNOSIS — I110 Hypertensive heart disease with heart failure: Secondary | ICD-10-CM | POA: Diagnosis not present

## 2021-11-19 DIAGNOSIS — E1151 Type 2 diabetes mellitus with diabetic peripheral angiopathy without gangrene: Secondary | ICD-10-CM | POA: Diagnosis not present

## 2021-11-19 DIAGNOSIS — Z89412 Acquired absence of left great toe: Secondary | ICD-10-CM | POA: Diagnosis not present

## 2021-11-19 DIAGNOSIS — L97509 Non-pressure chronic ulcer of other part of unspecified foot with unspecified severity: Secondary | ICD-10-CM | POA: Diagnosis not present

## 2021-11-19 DIAGNOSIS — Z4889 Encounter for other specified surgical aftercare: Secondary | ICD-10-CM | POA: Diagnosis not present

## 2021-11-19 DIAGNOSIS — E11621 Type 2 diabetes mellitus with foot ulcer: Secondary | ICD-10-CM | POA: Diagnosis not present

## 2021-11-19 DIAGNOSIS — I5042 Chronic combined systolic (congestive) and diastolic (congestive) heart failure: Secondary | ICD-10-CM | POA: Diagnosis not present

## 2021-11-19 DIAGNOSIS — L03116 Cellulitis of left lower limb: Secondary | ICD-10-CM | POA: Diagnosis not present

## 2021-11-19 DIAGNOSIS — Z992 Dependence on renal dialysis: Secondary | ICD-10-CM | POA: Diagnosis not present

## 2021-11-19 DIAGNOSIS — M86172 Other acute osteomyelitis, left ankle and foot: Secondary | ICD-10-CM | POA: Diagnosis not present

## 2021-11-19 DIAGNOSIS — Z4781 Encounter for orthopedic aftercare following surgical amputation: Secondary | ICD-10-CM | POA: Diagnosis not present

## 2021-11-19 DIAGNOSIS — N186 End stage renal disease: Secondary | ICD-10-CM | POA: Diagnosis not present

## 2021-11-19 DIAGNOSIS — B952 Enterococcus as the cause of diseases classified elsewhere: Secondary | ICD-10-CM | POA: Diagnosis not present

## 2021-11-19 DIAGNOSIS — I739 Peripheral vascular disease, unspecified: Secondary | ICD-10-CM | POA: Diagnosis not present

## 2021-11-19 DIAGNOSIS — G5792 Unspecified mononeuropathy of left lower limb: Secondary | ICD-10-CM | POA: Diagnosis not present

## 2021-11-19 LAB — GLUCOSE, CAPILLARY
Glucose-Capillary: 186 mg/dL — ABNORMAL HIGH (ref 70–99)
Glucose-Capillary: 97 mg/dL (ref 70–99)

## 2021-11-19 NOTE — Progress Notes (Signed)
[place], [place] ([id number]) Visit Report for 11/15/2021 HBO Details Patient Name: [name], [name] Date of Service: 11/15/2021 10:30 AM Medical Record Number: [id number] Patient Account Number: [id number] Date of Birth/Sex: 03-05-1966 (56 y.o. M) Treating RN: Donnamarie Poag Primary Care Treyshaun Keatts: [name] Other Clinician: Jacqulyn Bath Referring Olivya Sobol: [name] Treating Makana Rostad/Extender: [name] in Treatment: 2 HBO Treatment Course Details Treatment Course Number: 1 Ordering Noely Kuhnle: Jeri Cos Total Treatments Ordered: 40 HBO Treatment Start Date: 11/04/2021 HBO Indication: Chronic Refractory Osteomyelitis to Left Foot HBO Treatment Details Treatment Number: 8 Patient Type: Outpatient Chamber Type: Monoplace Chamber Serial #: [id number] Treatment Protocol: 2.0 ATA with 90 minutes oxygen, and no air breaks Treatment Details Compression Rate Down: 1.5 psi / minute De-Compression Rate Up: 1.5 psi / minute Compress Tx Pressure Air breaks and breathing periods Decompress Decompress Begins Reached (leave unused spaces blank) Begins Ends Chamber Pressure (ATA) 1 2 - - - - - - 2 1 Clock Time (24 hr) 10:50 11:02 - - - - - - 12:32 12:42 Treatment Length: 112 (minutes) Treatment Segments: 4 Vital Signs Capillary Blood Glucose Reference Range: 80 - 120 mg / dl HBO Diabetic Blood Glucose Intervention Range: <131 mg/dl or >249 mg/dl Time Vitals Blood Respiratory Capillary Blood Glucose Pulse Action Type: Pulse: Temperature: Taken: Pressure: Rate: Glucose (mg/dl): Meter #: Oximetry (%) Taken: Pre 10:34 126/82 72 16 98.2 132 1 none per protocol Post 12:47 130/71 78 16 98.1 131 1 none per protocol Treatment Response Treatment Toleration: Well Treatment Completion Treatment Completed without Adverse Event Status: HBO Attestation I certify that I supervised this HBO treatment in accordance with Medicare guidelines. A trained emergency response team is readily  Yes available per hospital policies and procedures. Continue HBOT as ordered. Yes Electronic Signature(s) Signed: 11/15/2021 4:31:19 PM By: Worthy Keeler PA-C Previous Signature: 11/15/2021 1:20:18 PM Version By: Enedina Finner RCP, RRT, CHT Previous Signature: 11/15/2021 4:30:05 PM Version By: Worthy Keeler PA-C Entered By: Worthy Keeler on 11/15/2021 16:31:18 Glassner, Kensley L. ([id number]) -------------------------------------------------------------------------------- HBO Safety Checklist Details Patient Name: [name], [name] Date of Service: 11/15/2021 10:30 AM Medical Record Number: [id number] Patient Account Number: [id number] Date of Birth/Sex: [birth year] (56 y.o. M) Treating RN: Donnamarie Poag Primary Care Raesha Coonrod: [name] Other Clinician: Jacqulyn Bath Referring Isiaah Cuervo: [name] Treating Davone Shinault/Extender: [name] in Treatment: 2 HBO Safety Checklist Items Safety Checklist Consent Form Signed Patient voided / foley secured and emptied When did you last eato 10:00 Last dose of injectable or oral agent n/a Ostomy pouch emptied and vented if applicable NA All implantable devices assessed, documented and approved NA Intravenous access site secured and place NA Valuables secured Linens and cotton and cotton/polyester blend (less than 51% polyester) Personal oil-based products / skin lotions / body lotions removed Wigs or hairpieces removed NA Smoking or tobacco materials removed NA Books / newspapers / magazines / loose paper removed NA Cologne, aftershave, perfume and deodorant removed Jewelry removed (may wrap wedding band) Make-up removed NA Hair care products removed Battery operated devices (external) removed NPWT pump Heating patches and chemical warmers removed NA Titanium eyewear removed NA Nail polish cured greater than 10 hours NA Casting material cured greater than 10 hours NA Hearing aids removed NA Loose  dentures or partials removed NA Prosthetics have been removed NA Patient demonstrates correct use of air break device (if applicable) Patient concerns have been addressed Patient grounding bracelet on and cord attached to chamber Specifics for Inpatients (complete in addition  to above) Medication sheet sent with patient Intravenous medications needed or due during therapy sent with patient Drainage tubes (e.g. nasogastric tube or chest tube secured and vented) Endotracheal or Tracheotomy tube secured Cuff deflated of air and inflated with saline Airway suctioned Electronic Signature(s) Signed: 11/15/2021 1:20:18 PM By: Enedina Finner RCP, RRT, CHT Entered By: Enedina Finner on 11/15/2021 10:52:28

## 2021-11-19 NOTE — Progress Notes (Signed)
Amir, [place] ([id number]) Visit Report for 11/19/2021 Arrival Information Details Patient Name: [name]. Date of Service: 11/19/2021 9:00 AM Medical Record Number: [id number] Patient Account Number: [id number] Date of Birth/Sex: 09/09/1966 (56 y.o. M) Treating RN: Donnamarie Poag Primary Care Demetrie Borge: [name] Other Clinician: Jacqulyn Bath Referring Emmali Karow: [name] Treating Kobee Medlen/Extender: [name] in Treatment: 2 Visit Information History Since Last Visit Added or deleted any medications: No Patient Arrived: Ambulatory Any new allergies or adverse reactions: No Arrival Time: 08:45 Had a fall or experienced change in No Accompanied By: self activities of daily living that may affect Transfer Assistance: None risk of falls: Patient Identification Verified: Yes Signs or symptoms of abuse/neglect since last visito No Secondary Verification Process Completed: Yes Hospitalized since last visit: No Patient Requires Transmission-Based No Implantable device outside of the clinic excluding No Precautions: cellular tissue based products placed in the center Patient Has Alerts: Yes since last visit: Patient Alerts: Patient on Blood Has Dressing in Place as Prescribed: Yes Thinner Has Footwear/Offloading in Place as Prescribed: Yes DIALYSIS RIGHT ARM Left: Surgical Shoe with Pressure Relief Insole Pain Present Now: No Electronic Signature(s) Signed: 11/19/2021 12:08:10 PM By: Enedina Finner RCP, RRT, CHT Entered By: Stark Jock, Amado Nash on 11/19/2021 09:13:09 Richart, Lessie L. ([id number]) -------------------------------------------------------------------------------- Encounter Discharge Information Details Patient Name: [name], [name] Date of Service: 11/19/2021 9:00 AM Medical Record Number: [id number] Patient Account Number: [id number] Date of Birth/Sex: [birth year] (56 y.o. M) Treating RN: Donnamarie Poag Primary Care Aimi Essner: [name] Other Clinician: Jacqulyn Bath Referring Tarig Zimmers: [name] Treating Quartez Lagos/Extender: [name] in Treatment: 2 Encounter Discharge Information Items Discharge Condition: Stable Ambulatory Status: Ambulatory Discharge Destination: Home Transportation: Private Auto Accompanied By: self Schedule Follow-up Appointment: Yes Clinical Summary of Care: Notes Patient has an HBO treatment scheduled on 11/20/21 at 10:30 am. Electronic Signature(s) Signed: 11/19/2021 12:08:10 PM By: Enedina Finner RCP, RRT, CHT Entered By: Stark Jock, Amado Nash on 11/19/2021 11:27:08 Tabone, Andrae L. ([id number]) -------------------------------------------------------------------------------- Vitals Details Patient Name: [name], [name] Date of Service: 11/19/2021 9:00 AM Medical Record Number: [id number] Patient Account Number: [id number] Date of Birth/Sex: 02-May-1966 (56 y.o. M) Treating RN: Donnamarie Poag Primary Care Tiawanna Luchsinger: [name] Other Clinician: Jacqulyn Bath Referring Verdia Bolt: [name] Treating Cheral Cappucci/Extender: [name] in Treatment: 2 Vital Signs Time Taken: 08:46 Temperature (F): 98.2 Height (in): 70 Pulse (bpm): 72 Weight (lbs): 180.4 Respiratory Rate (breaths/min): 16 Body Mass Index (BMI): 25.9 Blood Pressure (mmHg): 132/72 Capillary Blood Glucose (mg/dl): 186 Reference Range: 80 - 120 mg / dl Electronic Signature(s) Signed: 11/19/2021 12:08:10 PM By: Enedina Finner RCP, RRT, CHT Entered By: Enedina Finner on 11/19/2021 09:13:59

## 2021-11-19 NOTE — Progress Notes (Signed)
[name] ([id number]) Visit Report for 11/19/2021 HBO Details Patient Name: [name], [name] Date of Service: 11/19/2021 9:00 AM Medical Record Number: [id number] Patient Account Number: [id number] Date of Birth/Sex: 26-Jan-1966 (56 y.o. M) Treating RN: Donnamarie Poag Primary Care [name]: [name] Other Clinician: Jacqulyn [name] Referring [name]: [name] Treating [name]/Extender: [name] in Treatment: 2 HBO Treatment Course Details Treatment Course Number: 1 Ordering [name]: [name] Total Treatments Ordered: 40 HBO Treatment Start Date: 11/04/2021 HBO Indication: Chronic Refractory Osteomyelitis to Left Foot HBO Treatment Details Treatment Number: 10 Patient Type: Outpatient Chamber Type: Monoplace Chamber Serial #: [id number] Treatment Protocol: 2.0 ATA with 90 minutes oxygen, and no air breaks Treatment Details Compression Rate Down: 1.5 psi / minute De-Compression Rate Up: 1.5 psi / minute Compress Tx Pressure Air breaks and breathing periods Decompress Decompress Begins Reached (leave unused spaces blank) Begins Ends Chamber Pressure (ATA) 1 2 - - - - - - 2 1 Clock Time (24 hr) 09:03 09:15 - - - - - - 10:45 10:55 Treatment Length: 112 (minutes) Treatment Segments: 4 Vital Signs Capillary Blood Glucose Reference Range: 80 - 120 mg / dl HBO Diabetic Blood Glucose Intervention Range: <131 mg/dl or >249 mg/dl Time Vitals Blood Respiratory Capillary Blood Glucose Pulse Action Type: Pulse: Temperature: Taken: Pressure: Rate: Glucose (mg/dl): Meter #: Oximetry (%) Taken: Pre 08:46 132/72 72 16 98.2 186 1 none per protocol Post 11:01 128/74 72 16 98.5 97 1 Treatment Response Treatment Toleration: Well Treatment Completion Treatment Completed without Adverse Event Status: Electronic Signature(s) Signed: 11/19/2021 12:08:10 PM By: [name], RRT, CHT Signed: 11/19/2021 5:15:34 PM By: Worthy Keeler [name] Entered By:  [name], [name] on 11/19/2021 11:25:24 Koo, [name] ([id number]) -------------------------------------------------------------------------------- HBO Safety Checklist Details Patient Name: [name], [name] Date of Service: 11/19/2021 9:00 AM Medical Record Number: [id number] Patient Account Number: [id number] Date of Birth/Sex: 24-Mar-1966 (56 y.o. M) Treating RN: Donnamarie Poag Primary Care Deysi Soldo: [name] Other Clinician: Jacqulyn [name] Referring Karolyna Bianchini: [name] Treating Lillia Lengel/Extender: [name] in Treatment: 2 HBO Safety Checklist Items Safety Checklist Consent Form Signed Patient voided / foley secured and emptied When did you last eato 07:00 am Last dose of injectable or oral agent n/a Ostomy pouch emptied and vented if applicable NA All implantable devices assessed, documented and approved NA Intravenous access site secured and place NA Valuables secured Linens and cotton and cotton/polyester blend (less than 51% polyester) Personal oil-based products / skin lotions / body lotions removed Wigs or hairpieces removed NA Smoking or tobacco materials removed NA Books / newspapers / magazines / loose paper removed NA Cologne, aftershave, perfume and deodorant removed Jewelry removed (may wrap wedding band) Make-up removed NA Hair care products removed Battery operated devices (external) removed NPWT pump Heating patches and chemical warmers removed NA Titanium eyewear removed NA Nail polish cured greater than 10 hours NA Casting material cured greater than 10 hours NA Hearing aids removed NA Loose dentures or partials removed NA Prosthetics have been removed NA Patient demonstrates correct use of air break device (if applicable) Patient concerns have been addressed Patient grounding bracelet on and cord attached to chamber Specifics for Inpatients (complete in addition to above) Medication sheet sent with  patient Intravenous medications needed or due during therapy sent with patient Drainage tubes (e.g. nasogastric tube or chest tube secured and vented) Endotracheal or Tracheotomy tube secured Cuff deflated of air and inflated with saline Airway suctioned Electronic Signature(s) Signed: 11/19/2021 12:08:10 PM  By: Enedina Finner RCP, RRT, CHT Entered By: Enedina Finner on 11/19/2021 09:46:38

## 2021-11-20 ENCOUNTER — Encounter: Payer: Medicare Other | Attending: Internal Medicine | Admitting: Internal Medicine

## 2021-11-20 ENCOUNTER — Other Ambulatory Visit: Payer: Self-pay

## 2021-11-20 DIAGNOSIS — D631 Anemia in chronic kidney disease: Secondary | ICD-10-CM | POA: Diagnosis not present

## 2021-11-20 DIAGNOSIS — I110 Hypertensive heart disease with heart failure: Secondary | ICD-10-CM | POA: Diagnosis not present

## 2021-11-20 DIAGNOSIS — I2510 Atherosclerotic heart disease of native coronary artery without angina pectoris: Secondary | ICD-10-CM | POA: Insufficient documentation

## 2021-11-20 DIAGNOSIS — I5042 Chronic combined systolic (congestive) and diastolic (congestive) heart failure: Secondary | ICD-10-CM | POA: Insufficient documentation

## 2021-11-20 DIAGNOSIS — L98495 Non-pressure chronic ulcer of skin of other sites with muscle involvement without evidence of necrosis: Secondary | ICD-10-CM | POA: Diagnosis not present

## 2021-11-20 DIAGNOSIS — E11621 Type 2 diabetes mellitus with foot ulcer: Secondary | ICD-10-CM | POA: Diagnosis not present

## 2021-11-20 DIAGNOSIS — E1129 Type 2 diabetes mellitus with other diabetic kidney complication: Secondary | ICD-10-CM | POA: Diagnosis not present

## 2021-11-20 DIAGNOSIS — M86672 Other chronic osteomyelitis, left ankle and foot: Secondary | ICD-10-CM | POA: Insufficient documentation

## 2021-11-20 DIAGNOSIS — E118 Type 2 diabetes mellitus with unspecified complications: Secondary | ICD-10-CM | POA: Diagnosis not present

## 2021-11-20 DIAGNOSIS — N2581 Secondary hyperparathyroidism of renal origin: Secondary | ICD-10-CM | POA: Diagnosis not present

## 2021-11-20 DIAGNOSIS — T8189XA Other complications of procedures, not elsewhere classified, initial encounter: Secondary | ICD-10-CM | POA: Diagnosis not present

## 2021-11-20 DIAGNOSIS — E1169 Type 2 diabetes mellitus with other specified complication: Secondary | ICD-10-CM | POA: Diagnosis not present

## 2021-11-20 DIAGNOSIS — Z5181 Encounter for therapeutic drug level monitoring: Secondary | ICD-10-CM | POA: Diagnosis not present

## 2021-11-20 DIAGNOSIS — L97524 Non-pressure chronic ulcer of other part of left foot with necrosis of bone: Secondary | ICD-10-CM | POA: Diagnosis not present

## 2021-11-20 DIAGNOSIS — N186 End stage renal disease: Secondary | ICD-10-CM | POA: Diagnosis not present

## 2021-11-20 DIAGNOSIS — I10 Essential (primary) hypertension: Secondary | ICD-10-CM | POA: Diagnosis not present

## 2021-11-20 DIAGNOSIS — Z992 Dependence on renal dialysis: Secondary | ICD-10-CM | POA: Diagnosis not present

## 2021-11-20 LAB — GLUCOSE, CAPILLARY
Glucose-Capillary: 187 mg/dL — ABNORMAL HIGH (ref 70–99)
Glucose-Capillary: 121 mg/dL — ABNORMAL HIGH (ref 70–99)

## 2021-11-20 NOTE — Progress Notes (Signed)
R[place], [place] ([id number]) Visit Report for 11/20/2021 Arrival Information Details Patient Name: [name], [name]. Date of Service: 11/20/2021 10:30 AM Medical Record Number: [id number] Patient Account Number: [id number] Date of Birth/Sex: 1966/03/30 (56 y.o. M) Treating RN: [name] Primary Care [name]: [name] Other Clinician: Jacqulyn [name] Referring [name]: [name] Treating [name]/Extender: [name] in Treatment: 2 Visit Information History Since Last Visit Added or deleted any medications: No Patient Arrived: Ambulatory Any new allergies or adverse reactions: No Arrival Time: 10:30 Signs or symptoms of abuse/neglect since last visito No Accompanied By: self Hospitalized since last visit: No Transfer Assistance: None Implantable device outside of the clinic excluding No Patient Identification Verified: Yes cellular tissue based products placed in the center Secondary Verification Process Completed: Yes since last visit: Patient Requires Transmission-Based No Has Dressing in Place as Prescribed: Yes Precautions: Has Footwear/Offloading in Place as Prescribed: Yes Patient Has Alerts: Yes Left: Surgical Shoe with Pressure Patient Alerts: Patient on Blood Relief Insole Thinner Pain Present Now: No DIALYSIS RIGHT ARM Electronic Signature(s) Signed: 11/20/2021 1:28:41 PM By: [name] RCP, RRT, CHT Entered By: Stark Jock, Amado Nash on 11/20/2021 10:51:34 [name], [name] ([id number]) -------------------------------------------------------------------------------- Encounter Discharge Information Details Patient Name: [name], [name] Date of Service: 11/20/2021 10:30 AM Medical Record Number: [id number] Patient Account Number: [id number] Date of Birth/Sex: 1966/03/14 (56 y.o. M) Treating RN: [name] Primary Care [name]: [name] Other Clinician: Jacqulyn [name] Referring Kenden Brandt: [name] Treating  Maureena Dabbs/Extender: [name] in Treatment: 2 Encounter Discharge Information Items Discharge Condition: Stable Ambulatory Status: Ambulatory Discharge Destination: Home Transportation: Private Auto Accompanied By: self Schedule Follow-up Appointment: Yes Clinical Summary of Care: Notes Patient has an HBO treatment scheduled on 11/22/21 at 10:30 am. Electronic Signature(s) Signed: 11/20/2021 1:28:41 PM By: [name] RCP, RRT, CHT Entered By: Stark Jock, Amado Nash on 11/20/2021 13:27:52 [name], [name] ([id number]) -------------------------------------------------------------------------------- Vitals Details Patient Name: [name], [name] Date of Service: 11/20/2021 10:30 AM Medical Record Number: [id number] Patient Account Number: [id number] Date of Birth/Sex: [birth year] (56 y.o. M) Treating RN: [name] Primary Care Serinity Ware: [name] Other Clinician: Jacqulyn [name] Referring Kashara Blocher: [name] Treating Gaylynn Seiple/Extender: [name] in Treatment: 2 Vital Signs Time Taken: 10:30 Temperature (F): 98.5 Height (in): 70 Pulse (bpm): 84 Weight (lbs): 180.4 Respiratory Rate (breaths/min): 16 Body Mass Index (BMI): 25.9 Blood Pressure (mmHg): 120/72 Capillary Blood Glucose (mg/dl): 187 Reference Range: 80 - 120 mg / dl Electronic Signature(s) Signed: 11/20/2021 1:28:41 PM By: [name] RCP, RRT, CHT Entered By: [name] on 11/20/2021 10:52:32

## 2021-11-21 NOTE — Progress Notes (Signed)
[place], [place] ([id number]) Visit Report for 11/20/2021 HBO Details Patient Name: [name], [name] Date of Service: 11/20/2021 10:30 AM Medical Record Number: [id number] Patient Account Number: [id number] Date of Birth/Sex: [birth year] (56 y.o. M) Treating RN: Donnamarie Poag Primary Care Tamyka Bezio: [name] Other Clinician: Jacqulyn Bath Referring Annalina Needles: [name] Treating Roneisha Stern/Extender: Yaakov Guthrie in Treatment: 2 HBO Treatment Course Details Treatment Course Number: 1 Ordering Analicia Skibinski: Jeri Cos Total Treatments Ordered: 40 HBO Treatment Start Date: 11/04/2021 HBO Indication: Chronic Refractory Osteomyelitis to Left Foot HBO Treatment Details Treatment Number: 11 Patient Type: Outpatient Chamber Type: Monoplace Chamber Serial #: [id number] Treatment Protocol: 2.0 ATA with 90 minutes oxygen, and no air breaks Treatment Details Compression Rate Down: 1.5 psi / minute De-Compression Rate Up: 1.5 psi / minute Compress Tx Pressure Air breaks and breathing periods Decompress Decompress Begins Reached (leave unused spaces blank) Begins Ends Chamber Pressure (ATA) 1 2 - - - - - - 2 1 Clock Time (24 hr) 10:50 11:02 - - - - - - 12:32 12:43 Treatment Length: 113 (minutes) Treatment Segments: 4 Vital Signs Capillary Blood Glucose Reference Range: 80 - 120 mg / dl HBO Diabetic Blood Glucose Intervention Range: <131 mg/dl or >249 mg/dl Time Vitals Blood Respiratory Capillary Blood Glucose Pulse Action Type: Pulse: Temperature: Taken: Pressure: Rate: Glucose (mg/dl): Meter #: Oximetry (%) Taken: Pre 10:30 120/72 84 16 98.5 187 1 none per protocol Post 12:51 122/72 72 16 98.4 121 1 none per protocol Treatment Response Treatment Toleration: Well Treatment Completion Treatment Completed without Adverse Event Status: HBO Attestation I certify that I supervised this HBO treatment in accordance with Medicare guidelines. A trained emergency response team is readily  Yes available per hospital policies and procedures. Continue HBOT as ordered. Yes Electronic Signature(s) Signed: 11/21/2021 3:11:50 PM By: Kalman Shan DO Previous Signature: 11/20/2021 1:28:41 PM Version By: Enedina Finner RCP, RRT, CHT Entered By: Kalman Shan on 11/21/2021 15:11:17 Laumann, [place] ([id number]) -------------------------------------------------------------------------------- HBO Safety Checklist Details Patient Name: [name], [name] Date of Service: 11/20/2021 10:30 AM Medical Record Number: [id number] Patient Account Number: [id number] Date of Birth/Sex: 09/16/1966 (56 y.o. M) Treating RN: Donnamarie Poag Primary Care Theodis Kinsel: [name] Other Clinician: Jacqulyn Bath Referring Janvi Ammar: [name] Treating Elliott Lasecki/Extender: Yaakov Guthrie in Treatment: 2 HBO Safety Checklist Items Safety Checklist Consent Form Signed Patient voided / foley secured and emptied When did you last eato 10:00 am Last dose of injectable or oral agent n/a Ostomy pouch emptied and vented if applicable NA All implantable devices assessed, documented and approved NA Intravenous access site secured and place NA Valuables secured Linens and cotton and cotton/polyester blend (less than 51% polyester) Personal oil-based products / skin lotions / body lotions removed Wigs or hairpieces removed NA Smoking or tobacco materials removed NA Books / newspapers / magazines / loose paper removed NA Cologne, aftershave, perfume and deodorant removed Jewelry removed (may wrap wedding band) Make-up removed NA Hair care products removed Battery operated devices (external) removed NPWT pump Heating patches and chemical warmers removed NA Titanium eyewear removed NA Nail polish cured greater than 10 hours NA Casting material cured greater than 10 hours NA Hearing aids removed NA Loose dentures or partials removed NA Prosthetics have been removed NA Patient  demonstrates correct use of air break device (if applicable) Patient concerns have been addressed Patient grounding bracelet on and cord attached to chamber Specifics for Inpatients (complete in addition to above) Medication sheet sent with patient Intravenous medications needed or due  during therapy sent with patient Drainage tubes (e.g. nasogastric tube or chest tube secured and vented) Endotracheal or Tracheotomy tube secured Cuff deflated of air and inflated with saline Airway suctioned Electronic Signature(s) Signed: 11/20/2021 1:28:41 PM By: Enedina Finner RCP, RRT, CHT Entered By: Enedina Finner on 11/20/2021 10:54:14

## 2021-11-22 ENCOUNTER — Other Ambulatory Visit: Payer: Self-pay

## 2021-11-22 ENCOUNTER — Encounter: Payer: Medicare Other | Admitting: Physician Assistant

## 2021-11-22 DIAGNOSIS — N2581 Secondary hyperparathyroidism of renal origin: Secondary | ICD-10-CM | POA: Diagnosis not present

## 2021-11-22 DIAGNOSIS — I110 Hypertensive heart disease with heart failure: Secondary | ICD-10-CM | POA: Diagnosis not present

## 2021-11-22 DIAGNOSIS — T8189XA Other complications of procedures, not elsewhere classified, initial encounter: Secondary | ICD-10-CM | POA: Diagnosis not present

## 2021-11-22 DIAGNOSIS — I10 Essential (primary) hypertension: Secondary | ICD-10-CM | POA: Diagnosis not present

## 2021-11-22 DIAGNOSIS — E11621 Type 2 diabetes mellitus with foot ulcer: Secondary | ICD-10-CM | POA: Diagnosis not present

## 2021-11-22 DIAGNOSIS — I5042 Chronic combined systolic (congestive) and diastolic (congestive) heart failure: Secondary | ICD-10-CM | POA: Diagnosis not present

## 2021-11-22 DIAGNOSIS — E118 Type 2 diabetes mellitus with unspecified complications: Secondary | ICD-10-CM | POA: Diagnosis not present

## 2021-11-22 DIAGNOSIS — L98495 Non-pressure chronic ulcer of skin of other sites with muscle involvement without evidence of necrosis: Secondary | ICD-10-CM | POA: Diagnosis not present

## 2021-11-22 DIAGNOSIS — Z992 Dependence on renal dialysis: Secondary | ICD-10-CM | POA: Diagnosis not present

## 2021-11-22 DIAGNOSIS — N186 End stage renal disease: Secondary | ICD-10-CM | POA: Diagnosis not present

## 2021-11-22 DIAGNOSIS — M86672 Other chronic osteomyelitis, left ankle and foot: Secondary | ICD-10-CM | POA: Diagnosis not present

## 2021-11-22 DIAGNOSIS — E1169 Type 2 diabetes mellitus with other specified complication: Secondary | ICD-10-CM | POA: Diagnosis not present

## 2021-11-22 DIAGNOSIS — D631 Anemia in chronic kidney disease: Secondary | ICD-10-CM | POA: Diagnosis not present

## 2021-11-22 DIAGNOSIS — L97524 Non-pressure chronic ulcer of other part of left foot with necrosis of bone: Secondary | ICD-10-CM | POA: Diagnosis not present

## 2021-11-22 LAB — GLUCOSE, CAPILLARY
Glucose-Capillary: 179 mg/dL — ABNORMAL HIGH (ref 70–99)
Glucose-Capillary: 132 mg/dL — ABNORMAL HIGH (ref 70–99)

## 2021-11-22 NOTE — Progress Notes (Signed)
[place], [place] ([id number]) Visit Report for 11/22/2021 Arrival Information Details Patient Name: [name], [name]. Date of Service: 11/22/2021 10:30 AM Medical Record Number: [id number] Patient Account Number: [id number] Date of Birth/Sex: 11/14/1965 (56 y.o. M) Treating RN: Donnamarie Poag Primary Care Cedric Denison: [name] Other Clinician: Jacqulyn Bath Referring Cathren Sween: [name] Treating Alixandrea Milleson/Extender: [name] in Treatment: 3 Visit Information History Since Last Visit Added or deleted any medications: No Patient Arrived: Ambulatory Any new allergies or adverse reactions: No Arrival Time: 10:40 Had a fall or experienced change in No Accompanied By: self activities of daily living that may affect Transfer Assistance: None risk of falls: Patient Identification Verified: Yes Hospitalized since last visit: No Secondary Verification Process Completed: Yes Implantable device outside of the clinic excluding No Patient Requires Transmission-Based No cellular tissue based products placed in the center Precautions: since last visit: Patient Has Alerts: Yes Has Dressing in Place as Prescribed: Yes Patient Alerts: Patient on Blood Has Footwear/Offloading in Place as Prescribed: Yes Thinner Left: Surgical Shoe with Pressure DIALYSIS RIGHT ARM Relief Insole Pain Present Now: No Electronic Signature(s) Signed: 11/22/2021 1:39:29 PM By: Enedina Finner RCP, RRT, CHT Entered By: Stark Jock, Amado Nash on 11/22/2021 10:46:25 [name], [name] ([id number]) -------------------------------------------------------------------------------- Encounter Discharge Information Details Patient Name: [name], [name] Date of Service: 11/22/2021 10:30 AM Medical Record Number: [id number] Patient Account Number: [id number] Date of Birth/Sex: 1966/03/15 (56 y.o. M) Treating RN: Donnamarie Poag Primary Care Jaslene Marsteller: [name] Other Clinician: Jacqulyn Bath Referring Shaquinta Peruski:  [name] Treating Tamiko Leopard/Extender: [name] in Treatment: 3 Encounter Discharge Information Items Discharge Condition: Stable Ambulatory Status: Ambulatory Discharge Destination: Home Transportation: Private Auto Accompanied By: self Schedule Follow-up Appointment: Yes Clinical Summary of Care: Notes Patient has an HBO treatment scheduled on 11/25/21 at 10:30 am. Electronic Signature(s) Signed: 11/22/2021 1:39:29 PM By: Enedina Finner RCP, RRT, CHT Entered By: Stark Jock, Amado Nash on 11/22/2021 13:38:50 [name], [name] ([id number]) -------------------------------------------------------------------------------- Vitals Details Patient Name: [name], [name] Date of Service: 11/22/2021 10:30 AM Medical Record Number: [id number] Patient Account Number: [id number] Date of Birth/Sex: July 03, 1966 (56 y.o. M) Treating RN: Donnamarie Poag Primary Care Kamrynn Melott: [name] Other Clinician: Jacqulyn Bath Referring Shelba Susi: [name] Treating Maheen Cwikla/Extender: [name] in Treatment: 3 Vital Signs Time Taken: 10:40 Temperature (F): 98.5 Height (in): 70 Pulse (bpm): 72 Weight (lbs): 180.4 Respiratory Rate (breaths/min): 16 Body Mass Index (BMI): 25.9 Blood Pressure (mmHg): 126/70 Capillary Blood Glucose (mg/dl): 179 Reference Range: 80 - 120 mg / dl Electronic Signature(s) Signed: 11/22/2021 1:39:29 PM By: Enedina Finner RCP, RRT, CHT Entered By: Enedina Finner on 11/22/2021 10:46:53

## 2021-11-22 NOTE — Progress Notes (Signed)
Simien, Urie ([id number]) Visit Report for 11/22/2021 Arrival Information Details Patient Name: [name], [name]. Date of Service: 11/22/2021 1:15 PM Medical Record Number: [id number] Patient Account Number: [id number] Date of Birth/Sex: [birth year] (56 y.o. M) Treating RN: Donnamarie Poag Primary Care Ladina Shutters: [name] Other Clinician: Referring Crista Nuon: [name] Treating Valena Ivanov/Extender: [name] in Treatment: 3 Visit Information History Since Last Visit Added or deleted any medications: No Patient Arrived: Ambulatory Had a fall or experienced change in No Arrival Time: 13:23 activities of daily living that may affect Accompanied By: self risk of falls: Transfer Assistance: None Hospitalized since last visit: No Patient Identification Verified: Yes Has Dressing in Place as Prescribed: Yes Secondary Verification Process Completed: Yes Pain Present Now: No Patient Requires Transmission-Based No Precautions: Patient Has Alerts: Yes Patient Alerts: Patient on Blood Thinner DIALYSIS RIGHT ARM Aspirin 81/Brilinta Electronic Signature(s) Signed: 11/22/2021 2:34:13 PM By: Donnamarie Poag Entered By: Donnamarie Poag on 11/22/2021 13:33:59 [name], [name] ([id number]) -------------------------------------------------------------------------------- Encounter Discharge Information Details Patient Name: [name], [name] Date of Service: 11/22/2021 1:15 PM Medical Record Number: [id number] Patient Account Number: [id number] Date of Birth/Sex: [birth year] (56 y.o. M) Treating RN: Donnamarie Poag Primary Care [place] Stell: [name] Other Clinician: Referring Emilyann Banka: [name] Treating Abbegail Matuska/Extender: [name] in Treatment: 3 Encounter Discharge Information Items Post Procedure Vitals Discharge Condition: Stable Temperature (F): 98.7 Ambulatory Status: Ambulatory Pulse (bpm): 78 Discharge Destination: Home Respiratory Rate (breaths/min): 16 Transportation: Private  Auto Blood Pressure (mmHg): 142/72 Accompanied By: self Schedule Follow-up Appointment: Yes Clinical Summary of Care: Electronic Signature(s) Signed: 11/22/2021 2:34:13 PM By: Donnamarie Poag Entered By: Donnamarie Poag on 11/22/2021 13:52:31 [name] ([id number]) -------------------------------------------------------------------------------- Lower Extremity Assessment Details Patient Name: Ramus, [name] Date of Service: 11/22/2021 1:15 PM Medical Record Number: [id number] Patient Account Number: [id number] Date of Birth/Sex: December 02, 1965 (56 y.o. M) Treating RN: Donnamarie Poag Primary Care Nusaiba Guallpa: [name] Other Clinician: Referring Keva Darty: [name] Treating Khameron Gruenwald/Extender: Jeri Cos Weeks in Treatment: 3 Edema Assessment Assessed: [Left: Yes] [Right: No] Edema: [Left: N] [Right: o] Electronic Signature(s) Signed: 11/22/2021 2:34:13 PM By: Donnamarie Poag Entered By: Donnamarie Poag on 11/22/2021 13:35:18 Leeds, [name] ([id number]) -------------------------------------------------------------------------------- Multi Wound Chart Details Patient Name: Jarema, [name] Date of Service: 11/22/2021 1:15 PM Medical Record Number: [id number] Patient Account Number: [id number] Date of Birth/Sex: 1966/10/11 (56 y.o. 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 N/A N/A Wounding Event: Surgical Injury N/A N/A Primary Etiology: Open Surgical Wound N/A N/A Comorbid History: Cataracts, Anemia, Sleep Apnea, N/A N/A Arrhythmia, Congestive Heart Failure, Coronary Artery  Disease, Hypertension, Myocardial Infarction, Type II Diabetes, Neuropathy, Seizure Disorder Date Acquired: 09/09/2021 N/A N/A Weeks of Treatment: 0 N/A N/A Wound Status: Open N/A N/A Wound Recurrence: No N/A N/A Measurements L x W x D (cm) 7.5x2.4x2.9 N/A N/A Area (cm) : 14.137 N/A N/A Volume (cm) : 40.998 N/A N/A % Reduction in Area: 0.00% N/A N/A % Reduction in Volume: 0.00% N/A N/A Position 1 (o'clock): 12 Maximum Distance 1 (cm): 4.2 Tunneling: Yes N/A N/A Classification: Full Thickness With Exposed N/A N/A Support Structures Exudate Amount: Large N/A N/A Exudate Type: Serosanguineous N/A N/A Exudate Color: [name], brown N/A N/A Granulation Amount: Medium (34-66%) N/A  N/A Granulation Quality: [name] N/A N/A Necrotic Amount: Medium (34-66%) N/A N/A Exposed Structures: Fascia: Yes N/A N/A Fat Layer (Subcutaneous Tissue): Yes Tendon: Yes Muscle: Yes Joint: No Bone: No Treatment Notes Electronic Signature(s) [name], [name] ([id number]) Signed: 11/22/2021 2:34:13 PM By: Donnamarie Poag Entered By: Donnamarie Poag on 11/22/2021 13:36:41 [name], [name] ([id number]) -------------------------------------------------------------------------------- [place] Details Patient Name: [name]. Date of Service: 11/22/2021 1:15 PM Medical Record Number: [id number] Patient Account Number: [id number] Date of Birth/Sex: 29-Aug-1966 (56 y.o. M) Treating RN: Donnamarie Poag Primary Care Vidal Lampkins: [name] Other Clinician: Referring Trevonn Hallum: [name] Treating Christin Mccreedy/Extender: [name] in Treatment: 3 Active Inactive HBO Nursing Diagnoses: Anxiety related to feelings of confinement associated with the hyperbaric oxygen chamber Anxiety related to knowledge deficit of hyperbaric oxygen therapy and treatment procedures Discomfort related to temperature and humidity changes inside hyperbaric chamber Potential for barotraumas to ears, sinuses, teeth, and lungs  or cerebral gas embolism related to changes in atmospheric pressure inside hyperbaric oxygen chamber Potential for oxygen toxicity seizures related to delivery of 100% oxygen at an increased atmospheric pressure Potential for pulmonary oxygen toxicity related to delivery of 100% oxygen at an increased atmospheric pressure Goals: Barotrauma will be prevented during HBO2 Date Initiated: 10/31/2021 Target Resolution Date: 12/06/2021 Goal Status: Active Patient and/or family will be able to state/discuss factors appropriate to the management of their disease process during treatment Date Initiated: 10/31/2021 Target Resolution Date: 11/15/2021 Goal Status: Active Patient will tolerate the hyperbaric oxygen therapy treatment Date Initiated: 10/31/2021 Target Resolution Date: 12/06/2021 Goal Status: Active Patient will tolerate the internal climate of the chamber Date Initiated: 10/31/2021 Target Resolution Date: 11/29/2021 Goal Status: Active Patient/caregiver will verbalize understanding of HBO goals, rationale, procedures and potential hazards Date Initiated: 10/31/2021 Target Resolution Date: 11/19/2021 Goal Status: Active Signs and symptoms of pulmonary oxygen toxicity will be recognized and promptly addressed Date Initiated: 10/31/2021 Target Resolution Date: 12/13/2021 Goal Status: Active Signs and symptoms of seizure will be recognized and promptly addressed ; seizing patients will suffer no harm Date Initiated: 10/31/2021 Target Resolution Date: 12/20/2021 Goal Status: Active Interventions: Administer a five (5) minute air break for patient if signs and symptoms of seizure appear and notify the hyperbaric physician Administer a ten (10) minute air break for patient if signs and symptoms of seizure appear and notify the hyperbaric physician Administer the correct therapeutic gas delivery based on the patients needs and limitations, per physician order Assess and provide for patientos comfort  related to the hyperbaric environment and equalization of middle ear Assess for signs and symptoms related to adverse events, including but not limited to confinement anxiety, pneumothorax, oxygen toxicity and baurotrauma Assess patient for any history of confinement anxiety Assess patient's knowledge and expectations regarding hyperbaric medicine and provide education related to the hyperbaric environment, goals of treatment and prevention of adverse events Notes: Electronic Signature(s) Signed: 11/22/2021 2:34:13 PM By: Donnamarie Poag Entered By: Donnamarie Poag on 11/22/2021 13:36:17 [name], [name] ([id number]) [name], [name] ([id number]) -------------------------------------------------------------------------------- Pain Assessment Details Patient Name: [name], [name] Date of Service: 11/22/2021 1:15 PM Medical Record Number: [id number] Patient Account Number: [id number] Date of Birth/Sex: 05/16/1966 (56 y.o. M) Treating RN: Donnamarie Poag Primary Care Linnet Bottari: [name] Other Clinician: Referring Ellizabeth Dacruz: [name] Treating Cayce Quezada/Extender: [name] in Treatment: 3 Active Problems Location of Pain Severity and Description of Pain Patient Has Paino No Site Locations Rate the pain. Current Pain Level: 0 Pain Management and Medication Current Pain Management: Electronic  Signature(s) Signed: 11/22/2021 2:34:13 PM By: Donnamarie Poag Entered By: Donnamarie Poag on 11/22/2021 13:28:23 [name], [name] ([id number]) -------------------------------------------------------------------------------- Patient/Caregiver Education Details Patient Name: [name], [name] Date of Service: 11/22/2021 1:15 PM Medical Record Number: [id number] Patient Account Number: [id number] Date of Birth/Gender: January 05, 1966 (56 y.o. M) Treating RN: Donnamarie Poag Primary Care Physician: [name] Other Clinician: Referring Physician: [name] Treating Physician/Extender: [name] in Treatment:  3 Education Assessment Education Provided To: Patient and Caregiver Education Topics Provided Basic Hygiene: Elevated Blood Sugar/ Impact on Healing: Hyperbaric Oxygenation: Nutrition: Offloading: Welcome To [facility]: Wound Debridement: Wound/Skin Impairment: Electronic Signature(s) Signed: 11/22/2021 2:34:13 PM By: Donnamarie Poag Entered By: Donnamarie Poag on 11/22/2021 13:45:19 [name], [name] ([id number]) -------------------------------------------------------------------------------- Wound Assessment Details Patient Name: [name], [name] Date of Service: 11/22/2021 1:15 PM Medical Record Number: [id number] Patient Account Number: [id number] Date of Birth/Sex: 1966/02/06 (56 y.o. 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 Wound Open Wounding Event: Surgical Injury Status: Date Acquired: 09/09/2021 Comorbid Cataracts, Anemia, Sleep Apnea, Arrhythmia, Congestive Weeks Of Treatment: 0 History: Heart Failure, Coronary Artery Disease, Hypertension, Clustered Wound: No Myocardial Infarction, Type II Diabetes, Neuropathy, Seizure Disorder Photos Wound Measurements Length: (cm) 7.5 Width: (cm) 2.4 Depth: (cm) 2.9 Area: (cm) 14.137 Volume: (cm) 40.998 % Reduction in Area: 0% % Reduction in Volume: 0% Tunneling: Yes Position (o'clock): 12 Maximum Distance: (cm) 4.2 Undermining: No Wound Description Classification: Full Thickness With Exposed Support Structures Exudate Amount: Large Exudate Type: Serosanguineous Exudate Color: [name], brown Foul Odor After Cleansing: No Slough/Fibrino Yes Wound Bed Granulation Amount: Medium (34-66%) Exposed Structure Granulation Quality: [name] Fascia Exposed: Yes Necrotic Amount:  Medium (34-66%) Fat Layer (Subcutaneous Tissue) Exposed: Yes Necrotic Quality: Adherent Slough Tendon Exposed: Yes Muscle Exposed: Yes Necrosis of Muscle: Yes Joint Exposed: No Bone Exposed: No Treatment Notes Wound #1 (Amputation Site - Digit) Wound Laterality: Left Cleanser Normal Saline [name], [name] ([id number]) Discharge Instruction: Wash your hands with soap and water. Remove old dressing, discard into plastic bag and place into trash. Cleanse the wound with Normal Saline prior to applying a clean dressing using gauze sponges, not tissues or cotton balls. Do not scrub or use excessive force. Pat dry using gauze sponges, not tissue or cotton balls. Vashe 5.8 (oz) Discharge Instruction: Use vashe 5.8 (oz) as directed Wound Cleanser Discharge Instruction: Wash your hands with soap and water. Remove old dressing, discard into plastic bag and place into trash. Cleanse the wound with Wound Cleanser prior to applying a clean dressing using gauze sponges, not tissues or cotton balls. Do not scrub or use excessive force. Pat dry using gauze sponges, not tissue or cotton balls. Peri-Wound Care Topical Primary Dressing Gauze Discharge Instruction: VASHE/DAKINS-As directed: dry, moistened with saline or moistened with Dakins Solution Secondary Dressing ABD Pad 5x9 (in/in) Discharge Instruction: Cover with ABD pad Secured With 65M Medipore H Soft Cloth Surgical Tape, 2x2 (in/yd) Kerlix Roll Sterile or Non-Sterile 6-ply 4.5x4 (yd/yd) Discharge Instruction: Apply Kerlix as directed Compression Wrap Compression Stockings Add-Ons Electronic Signature(s) Signed: 11/22/2021 2:34:13 PM By: Donnamarie Poag Entered By: Donnamarie Poag on 11/22/2021 13:34:33 [name], [name] ([id number]) -------------------------------------------------------------------------------- Vitals Details Patient Name: Amaro, Morad L. Date of Service: 11/22/2021 1:15 PM Medical Record Number: [id number] Patient Account  Number: [id number] Date of Birth/Sex: 1966-09-05 (56 y.o.  M) Treating RN: Donnamarie Poag Primary Care Claris Pech: [name] Other Clinician: Referring Pope Brunty: [name] Treating Daniesha Driver/Extender: [name] in Treatment: 3 Vital Signs Time Taken: 13:00 Temperature (F): 98.7 Height (in): 70 Pulse (bpm): 78 Weight (lbs): 180.4 Respiratory Rate (breaths/min): 16 Body Mass Index (BMI): 25.9 Blood Pressure (mmHg): 142/72 Capillary Blood Glucose (mg/dl): 132 Reference Range: 80 - 120 mg / dl Electronic Signature(s) Signed: 11/22/2021 2:34:13 PM By: Donnamarie Poag Entered ByDonnamarie Poag on 11/22/2021 13:24:46

## 2021-11-22 NOTE — Progress Notes (Addendum)
Cross, Butterfield ([id number]) Visit Report for 11/22/2021 Chief Complaint Document Details Patient Name: [name], [name]. Date of Service: 11/22/2021 1:15 PM Medical Record Number: [id number] Patient Account Number: [id number] Date of Birth/Sex: 08-29-1966 (56 y.o. M) Treating RN: Donnamarie Poag Primary Care Provider: [name] Other Clinician: Referring Provider: [name] Treating Provider/Extender: [name] in Treatment: 3 Information Obtained from: Patient Chief Complaint HBO evaluation for treatment on the left foot diabetic ulcer with osteomyelitis. We are seeing him for HBO only no wound care currently. Electronic Signature(s) Signed: 11/22/2021 1:26:51 PM By: Worthy Keeler PA-C Entered By: Worthy Keeler on 11/22/2021 13:26:51 Jafari, Christiano L. ([id number]) -------------------------------------------------------------------------------- Debridement Details Patient Name: [name], [name] Date of Service: 11/22/2021 1:15 PM Medical Record Number: [id number] Patient Account Number: [id number] Date of Birth/Sex: Jan 22, 1966 (56 y.o. M) Treating RN: Donnamarie Poag Primary Care Provider: [name] Other Clinician: Referring Provider: [name] Treating Provider/Extender: [name] in Treatment: 3 Debridement Performed for Wound #1 Left Amputation Site - Digit Assessment: Performed By: Physician Tommie Sams., PA-C Debridement Type: Debridement Level of Consciousness (Pre- Awake and Alert procedure): Pre-procedure Verification/Time Out Yes - 13:34 Taken: Start Time: 13:35 Total Area Debrided (L x W): 7.5 (cm) x 2.4 (cm) = 18 (cm) Tissue and other material Viable, Non-Viable, Callus, Muscle, Slough, Subcutaneous, Fascia , Slough debrided: Level: Skin/Subcutaneous Tissue/Muscle Debridement Description: Excisional Instrument: Curette Bleeding: Moderate Hemostasis Achieved: Pressure Response to Treatment: Procedure was tolerated well Level of  Consciousness (Post- Awake and Alert procedure): Post Debridement Measurements of Total Wound Length: (cm) 7.5 Width: (cm) 2.4 Depth: (cm) 2.9 Volume: (cm) 40.998 Character of Wound/Ulcer Post Debridement: Improved Post Procedure Diagnosis Same as Pre-procedure Electronic Signature(s) Signed: 11/22/2021 2:34:13 PM By: Donnamarie Poag Signed: 11/22/2021 4:48:07 PM By: Worthy Keeler PA-C Entered By: Donnamarie Poag on 11/22/2021 13:39:35 Ruacho, [place]akonda ([id number]) -------------------------------------------------------------------------------- HPI Details Patient Name: [name], [name] Date of Service: 11/22/2021 1:15 PM Medical Record Number: [id number] Patient Account Number: [id number] Date of Birth/Sex: [birth year] (56 y.o. M) Treating RN: Donnamarie Poag Primary Care Provider: [name] Other Clinician: Referring Provider: [name] Treating Provider/Extender: [name] in Treatment: 3 History of Present Illness HPI Description: 10/31/2021 patient presents today for evaluation here in our clinic. He is actually being evaluated for hyperbaric oxygen therapy only. He has been seen at [facility] up to this point he is under the care of the vascular surgery specialty. Subsequently he is also under the care of the hyperbaric center there. With that being said that due to the fact that he is a dialysis patient he was actually recommended to be transferred to [place] for his treatments he actually lives in [place] which is where his dialysis is. It was a hardship for him to try to get from [place] especially on dialysis days all the way to [facility] get into the facility for his treatment and get back home he was literally spending all day long. He has had just 4 treatments there at [facility] thus far based on what I see in these have not been continued while he awaits approval here in our clinic. Subsequently I do have records for review that will be included in the HPI  predetermination review and attached to this chart as well. I will not duplicate that here. 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 Nonetheless he continues to have exposed bone with marked soft tissue swelling compatible with osteomyelitis. This is due to the irregularity of the head of the metatarsal as well. Nonetheless along with having associated cellulitis he is also good to be seen by infectious disease and is currently on antibiotics for this as well. Again that will be detailed in the pretreatment review section which I will attach to this note as well. He does currently have a wound VAC in place and again the wound was not evaluated here in the clinic as that is being completely managed by home health and [facility]. The patient does have a history of diabetes mellitus type 2, hypertension, coronary artery disease, and congestive heart failure. He also has cataracts of both eyes but no evidence of glaucoma on his most recent eye exam. 11/22/2021 we have been seeing this gentleman for hyperbaric oxygen therapy but have not actually evaluated his wound up to this point this was being managed by the wound care and vascular team I presumed at [place] at least that is what has been told to be previous. Nonetheless at this point I got a call from Atlee Abide who is a Librarian, academic at the [facility] vascular clinic with Dr. Durene Fruits and subsequently they were wanting to know if I could take over wound care for this patient. Apparently they do not have the ability for an office debridement which again I completely understand but nonetheless definitely is not an integral part of his healing along with the hyperbarics. Subsequently I discussed with the patient today that I do believe he would benefit going ahead and proceeding with evaluation here  in the clinic for that reason I did go ahead and see him today to get things started. There is also been some confusion about the issues with his home health nurse and getting measurements to [facility] for the wound VAC in order to continue his wound VAC therapy. With that being said after I see him today we will make a determination on what to do with wound VAC we can definitely send measurements to [facility] but again the bigger question is whether this is the best way to go or not. Electronic Signature(s) Signed: 11/22/2021 3:38:34 PM By: Worthy Keeler PA-C Entered By: Worthy Keeler on 11/22/2021 15:38:34 Livas, Zackarie L. ([id number]) -------------------------------------------------------------------------------- Physical Exam Details Patient Name: Shibata, Keaton L. Date of Service: 11/22/2021 1:15 PM Medical Record Number: [id number] Patient Account Number: [id number] Date of Birth/Sex: [birth year] (56 y.o. M) Treating RN: Donnamarie Poag Primary Care Provider: [name] Other Clinician: Referring Provider: [name] Treating Provider/Extender: [name] in Treatment: 3 Constitutional Well-nourished and well-hydrated in no acute distress. Respiratory normal breathing without difficulty. Psychiatric this patient is able to make decisions and demonstrates good insight into disease process. Alert and Oriented x 3. pleasant and cooperative. Notes Upon inspection patient's wound bed actually showed signs of having significant amount of necrotic tissue noted which was somewhat unfortunate. I do believe that wound VAC probably is not the primary way to try to get this to heal at this point. Especially in light of the fact that there is some much necrotic tissue. With that being said I do think that I was able to get some of the necrotic tissue away today although with his blood thinners it has to be done carefully and is going to take a little bit of time to get this to where it is at a healthy  state in my opinion. I discussed that with him today as well. For the time being I think the best bet is to probably go ahead and discontinue the wound VAC. I did discuss this with the nurse for Dr. Durene Fruits at [facility]. Also did discuss this with Judson Roch with [employer] whom I called as well and they are going to send someone out to pick up the wound VAC she stated we can always reorder it at any time when and if we want to reinitiate wound VAC therapy. Electronic Signature(s) Signed: 11/22/2021 3:40:24 PM By: Worthy Keeler PA-C Entered By: Worthy Keeler on 11/22/2021 15:40:24 Sainsbury, Avinash L. ([id number]) -------------------------------------------------------------------------------- Physician Orders Details Patient Name: Raynes, Sandra L. Date of Service: 11/22/2021 1:15 PM Medical Record Number: [id number] Patient Account Number: [id number] Date of Birth/Sex: October 31, 1965 (56 y.o. M) Treating RN: Donnamarie Poag Primary Care Provider: [name] Other Clinician: Referring Provider: [name] Treating Provider/Extender: [name] in Treatment: 3 Verbal / Phone Orders: No Diagnosis Coding ICD-10 Coding Code Description E11.621 Type 2 diabetes mellitus with foot ulcer 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e M86.672 Other chronic osteomyelitis, left ankle and foot I10 Essential (primary) hypertension I25.10 Atherosclerotic heart disease of native coronary artery without angina pectoris I50.42 Chronic combined systolic (congestive) and diastolic (congestive) heart failure Follow-up Appointments o Return Appointment in 1 week. - provider visits at wound center o Return Appointment in: - HBO- (daily Monday -Friday for HBO Treatments) [place] for wound care. May utilize formulary equivalent dressing for wound treatment orders unless otherwise specified. Home Health Nurse may visit  PRN to address patientos wound care needs. o **Please direct any NON-WOUND related issues/requests for orders to patient's Primary Care Physician. **If current dressing causes regression in wound condition, may D/C ordered dressing product/s and apply Normal Saline Moist Dressing daily until next [place] or Other MD appointment. **Notify [facility]aling Center of regression in wound condition at [phone]. o Other Home Health Orders/Instructions: - HOLD WOUND VAC AT THIS TIME Bathing/ Shower/ Hygiene o Clean wound with Normal Saline or wound cleanser. o May shower with wound dressing protected with water repellent cover or cast protector. o No tub bath. Additional Orders / Instructions o Follow Nutritious Diet and Increase Protein Intake o Other: - Follow vascular, nephrology and PCP for wound care and other needs Dialysis appt as scheduled M-W-F Hyperbaric Oxygen Therapy o Indication and location: - 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 o Follow Hyperbaric Oxygen Glycemia Protocol Wound Treatment Wound #1 - Amputation Site - Digit Wound Laterality: Left Cleanser: Normal Saline 1 x Per Day/30 Days Discharge Instructions: Wash your hands with soap and water. Remove old dressing, discard into plastic bag and place into trash. Cleanse the wound with Normal Saline prior to applying a clean dressing using gauze sponges, not tissues or cotton balls. Do not scrub or use excessive force. Pat dry using gauze sponges, not tissue or cotton balls. Cleanser: Vashe 5.8 (oz) 1 x Per Day/30 Days Discharge Instructions: Use vashe 5.8 (oz) as  directed Cleanser: Wound Cleanser 1 x Per Day/30  Days Discharge Instructions: Wash your hands with soap and water. Remove old dressing, discard into plastic bag and place into trash. Cleanse the wound with Wound Cleanser prior to applying a clean dressing using gauze sponges, not tissues or cotton balls. Do not scrub or use excessive force. Pat dry using gauze sponges, not tissue or cotton balls. Renken, Caldwell ([id number]) Primary Dressing: Gauze 1 x Per Day/30 Days Discharge Instructions: VASHE/DAKINS-As directed: dry, moistened with saline or moistened with Dakins Solution Secondary Dressing: ABD Pad 5x9 (in/in) 1 x Per Day/30 Days Discharge Instructions: Cover with ABD pad Secured With: 23M Medipore H Soft Cloth Surgical Tape, 2x2 (in/yd) 1 x Per Day/30 Days Secured With: Kerlix Roll Sterile or Non-Sterile 6-ply 4.5x4 (yd/yd) 1 x Per Day/30 Days Discharge Instructions: Apply Kerlix as directed [place] pre-HBO capillary blood glucose (ensure 1 physician order is in chart). A. Notify HBO physician and await physician orders. 2 If result is 70 mg/dl or below: B. If the result meets the hospital definition of a critical result, follow hospital policy. A. Give patient an 8 ounce Glucerna Shake, an 8 ounce Ensure, or 8 ounces of a Glucerna/Ensure equivalent dietary supplement*. B. Wait 30 minutes. If result is 71 mg/dl to 130 mg/dl: C. Retest patientos capillary blood glucose (CBG). D. If result greater than or equal to 110 mg/dl, proceed with HBO. If result less than 110 mg/dl, notify HBO physician and consider holding HBO. If result is 131 mg/dl to 249 mg/dl: A. Proceed with HBO. A. Notify HBO physician and await physician orders. B. It is recommended to hold HBO and do If result is 250 mg/dl or greater: blood/urine ketone testing. C. If the result meets the hospital definition of a critical  result, follow hospital policy. POST-HBO GLYCEMIA INTERVENTIONS ACTION INTERVENTION Obtain post HBO capillary blood glucose (ensure 1 physician order is in chart). A. Notify HBO physician and await physician orders. 2 If result is 70 mg/dl or below: B. If the result meets the hospital definition of a critical result, follow hospital policy. A. Give patient an 8 ounce Glucerna Shake, an 8 ounce Ensure, or 8 ounces of a Glucerna/Ensure equivalent dietary supplement*. B. Wait 15 minutes for symptoms of hypoglycemia (i.e. nervousness, anxiety, If result is 71 mg/dl to 100 mg/dl: sweating, chills, clamminess, irritability, confusion, tachycardia or dizziness). C. If patient asymptomatic, discharge patient. If patient symptomatic, repeat capillary blood glucose (CBG) and notify HBO physician. If result is 101 mg/dl to 249 mg/dl: A. Discharge patient. A. Notify HBO physician and await physician orders. B. It is recommended to do blood/urine If result is 250 mg/dl or greater: ketone testing. C. If the result meets the hospital definition of a critical result, follow hospital policy. *Juice or candies are NOT equivalent products. If patient refuses the Glucerna or Ensure, please consult the hospital dietitian for an appropriate substitute. Electronic Signature(s) Signed: 11/22/2021 2:34:13 PM By: Erick Alley, Baruch L. ([id number]) Signed: 11/22/2021 4:48:07 PM By: Worthy Keeler PA-C Entered By: Donnamarie Poag on 11/22/2021 13:44:14 Montijo, [place] ([id number]) -------------------------------------------------------------------------------- Problem List Details Patient Name: Baltimore, Willet L. Date of Service: 11/22/2021 1:15 PM Medical Record Number: [id number] Patient Account Number: [id number] Date of Birth/Sex: Sep 13, 1966 (56 y.o. M) Treating RN: Donnamarie Poag Primary Care Provider: [name] Other Clinician: Referring Provider: [name] Treating Provider/Extender:  [name] in Treatment: 3 Active Problems ICD-10 Encounter Code Description Active Date MDM Diagnosis E11.621 Type 2 diabetes mellitus with  foot ulcer 10/31/2021 No Yes L97.524 Non-pressure chronic ulcer of other part of left foot with necrosis of 10/31/2021 No Yes bone J19.417 Other chronic osteomyelitis, left ankle and foot 10/31/2021 No Yes I10 Essential (primary) hypertension 10/31/2021 No Yes I25.10 Atherosclerotic heart disease of native coronary artery without angina 10/31/2021 No Yes pectoris I50.42 Chronic combined systolic (congestive) and diastolic (congestive) heart 10/31/2021 No Yes failure Inactive Problems Resolved Problems Electronic Signature(s) Signed: 11/22/2021 1:26:36 PM By: Worthy Keeler PA-C Entered By: Worthy Keeler on 11/22/2021 13:26:36 Efferson, Lemond L. ([id number]) -------------------------------------------------------------------------------- Progress Note Details Patient Name: Ulysse, Matty L. Date of Service: 11/22/2021 1:15 PM Medical Record Number: [id number] Patient Account Number: [id number] Date of Birth/Sex: 1965/11/30 (56 y.o. M) Treating RN: Donnamarie Poag Primary Care Provider: [name] Other Clinician: Referring Provider: [name] Treating Provider/Extender: [name] in Treatment: 3 Subjective Chief Complaint Information obtained from Patient HBO evaluation for treatment on the left foot diabetic ulcer with osteomyelitis. We are seeing him for HBO only no wound care currently. History of Present Illness (HPI) 10/31/2021 patient presents today for evaluation here in our clinic. He is actually being evaluated for hyperbaric oxygen therapy only. He has been seen at [facility] up to this point he is under the care of the vascular surgery specialty. Subsequently he is also under the care of the hyperbaric center there. With that being said that due to the fact that he is a dialysis patient he was actually recommended to  be transferred to [place] for his treatments he actually lives in [place] which is where his dialysis is. It was a hardship for him to try to get from [place] especially on dialysis days all the way to [facility] get into the facility for his treatment and get back home he was literally spending all day long. He has had just 4 treatments there at [facility] thus far based on what I see in these have not been continued while he awaits approval here in our clinic. Subsequently I do have records for review that will be included in the HPI predetermination review and attached to this chart as well. I will not duplicate that here. 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 Nonetheless he continues to have exposed bone with marked soft tissue swelling compatible with osteomyelitis. This is due to the irregularity of the head of the metatarsal as well. Nonetheless along with having associated cellulitis he is also good to be seen by infectious disease and is currently on antibiotics for this as well. Again that will be detailed in the pretreatment review section which I will attach to this note as well. He does currently have a wound VAC in place and again the wound was not evaluated here in the clinic as that is being completely managed by home health and [facility]. The patient does have a history of diabetes mellitus type 2, hypertension, coronary artery disease, and congestive heart failure. He also has cataracts of both eyes but no evidence of glaucoma on his most recent eye exam. 11/22/2021 we have been seeing this gentleman for hyperbaric oxygen therapy but have not actually evaluated his wound up to this point this was being managed by the wound care and vascular team I presumed at [place] at least that is what has been told to be previous. Nonetheless at  this point I got a call from Atlee Abide who is a Librarian, academic at the [facility] vascular clinic with Dr. Durene Fruits and subsequently they were wanting to know if I could take over wound care for this patient. Apparently they do not have the ability for an office debridement which again I completely understand but nonetheless definitely is not an integral part of his healing along with the hyperbarics. Subsequently I discussed with the patient today that I do believe he would benefit going ahead and proceeding with evaluation here in the clinic for that reason I did go ahead and see him today to get things started. There is also been some confusion about the issues with his home health nurse and getting measurements to [facility] for the wound VAC in order to continue his wound VAC therapy. With that being said after I see him today we will make a determination on what to do with wound VAC we can definitely send measurements to [facility] but again the bigger question is whether this is the best way to go or not. Objective Constitutional Well-nourished and well-hydrated in no acute distress. Vitals Time Taken: 1:00 PM, Height: 70 in, Weight: 180.4 lbs, BMI: 25.9, Temperature: 98.7 F, Pulse: 78 bpm, Respiratory Rate: 16 breaths/min, Blood Pressure: 142/72 mmHg, Capillary Blood Glucose: 132 mg/dl. Respiratory normal breathing without difficulty. Psychiatric this patient is able to make decisions and demonstrates good insight into disease process. Alert and Oriented x 3. pleasant and cooperative. General Notes: Upon inspection patient's wound bed actually showed signs of having significant amount of necrotic tissue noted which was somewhat unfortunate. I do believe that wound VAC probably is not the primary way to try to get this to heal at this point. Especially in light of the fact that there is some much necrotic tissue. With that being said I do think that I was able to get some of the necrotic tissue away  today although with his blood thinners it has to be done carefully and is going to take a little bit of time to get this to where it is at a healthy state in my opinion. I discussed that with him today as well. For the time being I think the best bet is to probably go ahead and discontinue the wound VAC. I did discuss this with the nurse for Dr. Durene Fruits at [facility]. Also did discuss this with Judson Roch with [employer] whom I called as well and they are going to send someone out to pick up the wound VAC she stated we can always reorder it at any time when and if we want to reinitiate wound VAC Limbach, Clary L. ([id number]) therapy. Integumentary (Hair, Skin) Wound #1 status is Open. Original cause of wound was Surgical Injury. The date acquired was: 09/09/2021. 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 The wound measures 7.5cm length x 2.4cm width x 2.9cm depth; 14.137cm^2 area and 40.998cm^3 volume. There is muscle, tendon, Fat Layer (Subcutaneous Tissue), and fascia exposed. There is no undermining noted, however, there is tunneling at 12:00 with a maximum distance of 4.2cm. There is a large amount of serosanguineous drainage noted. There is medium (34-66%) red granulation within the wound bed. There is a medium (34-66%) amount of necrotic tissue within the wound bed including Adherent Slough and Necrosis of Muscle. Assessment Active Problems ICD-10 Type 2 diabetes mellitus with foot ulcer Non-pressure chronic ulcer of other part of left foot with necrosis of bone Other chronic osteomyelitis, left ankle and  foot Essential (primary) hypertension Atherosclerotic heart disease of native coronary artery without angina pectoris Chronic combined systolic (congestive) and diastolic (congestive) heart failure Procedures Wound #1 Pre-procedure diagnosis of Wound #1 is an Open Surgical Wound located on the Left Amputation Site - Digit . There was a Excisional Skin/Subcutaneous Tissue/Muscle  Debridement with a total area of 18 sq cm performed by Tommie Sams., PA-C. With the following instrument (s): Curette to remove Viable and Non-Viable tissue/material. Material removed includes Muscle, Callus, Subcutaneous Tissue, Slough, and Fascia. A time out was conducted at 13:34, prior to the start of the procedure. A Moderate amount of bleeding was controlled with Pressure. The procedure was tolerated well. Post Debridement Measurements: 7.5cm length x 2.4cm width x 2.9cm depth; 40.998cm^3 volume. Character of Wound/Ulcer Post Debridement is improved. Post procedure Diagnosis Wound #1: Same as Pre-Procedure Plan Follow-up Appointments: Return Appointment in 1 week. - provider visits at wound center Return Appointment in: - HBO- (daily Monday -Friday for HBO Treatments) Home Health: [facility] for wound care. May utilize formulary equivalent dressing for wound treatment orders unless otherwise specified. Home Health Nurse may visit PRN to address patient s wound care needs. **Please direct any NON-WOUND related issues/requests for orders to patient's Primary Care Physician. **If current dressing causes regression in wound condition, may D/C ordered dressing product/s and apply Normal Saline Moist Dressing daily until next [place] or Other MD appointment. **Notify [facility] of regression in wound condition at [phone]. Other Home Health Orders/Instructions: - HOLD WOUND VAC AT THIS TIME Bathing/ Shower/ Hygiene: Clean wound with Normal Saline or wound cleanser. May shower with wound dressing protected with water repellent cover or cast protector. No tub bath. 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 Follow Hyperbaric Oxygen Glycemia Protocol WOUND #1: - Amputation Site - Digit Wound Laterality: Left Cleanser: Normal Saline 1 x Per Day/30 Days Burgard, Jules L. ([id number]) Discharge Instructions: Wash your hands with soap and water. Remove old dressing, discard into plastic bag and place into trash. Cleanse the wound with Normal Saline prior to applying a clean dressing using gauze sponges, not tissues or cotton balls. Do not scrub or use excessive force. Pat dry using gauze sponges, not tissue or cotton balls. Cleanser: Vashe 5.8 (oz) 1 x Per Day/30 Days Discharge Instructions: Use vashe 5.8 (oz) as directed Cleanser: Wound Cleanser 1 x Per Day/30 Days Discharge Instructions: Wash your hands with soap and water. Remove old dressing, discard into plastic bag and place into trash. Cleanse the wound with Wound Cleanser prior to applying a clean dressing using gauze sponges, not tissues or cotton balls. Do not scrub or use excessive force. Pat dry using gauze sponges, not tissue or cotton balls. Primary Dressing: Gauze 1 x Per Day/30 Days Discharge Instructions: VASHE/DAKINS-As directed: dry, moistened with saline or moistened with Dakins Solution Secondary Dressing: ABD Pad 5x9 (in/in) 1 x Per Day/30 Days Discharge Instructions: Cover with ABD pad Secured With: Dalhart  Cloth Surgical Tape, 2x2 (in/yd) 1 x Per Day/30 Days Secured With: Kerlix Roll Sterile or Non-Sterile 6-ply 4.5x4 (yd/yd) 1 x Per Day/30 Days Discharge Instructions: Apply Kerlix as directed 1. Would recommend currently that we going continue with the wound care measures as before and the  patient is in agreement with plan. This includes the use of the Vashe moistened gauze packing which again has done well in the past I feel like from what the patient tells me although again this is the first time of ever seeing the wound. Nonetheless I think that order the Dakin's here in the clinic which is the equivalent would be the right way to go currently. 2. I explained to the patient regarding the need for serial debridements over the next several weeks to try to get the surface clean and ready to go once everything is ready to go we will get him started back with the wound VAC. Nonetheless right now I am going to discontinue that and that was relayed to Dr. Gari Crown as well as [facility] they are sending someone out to pick that up. We will see patient back for reevaluation in 1 week here in the clinic. If anything worsens or changes patient will contact our office for additional recommendations. 3. I definitely think the patient is appropriate to continue with hyperbaric oxygen therapy which I do believe can be beneficial from this point forward I will monitor his progress And be able to track how the HBO therapy coupled with standard and good wound care will do for him. We will see patient back for reevaluation in 1 week here in the clinic. If anything worsens or changes patient will contact our office for additional recommendations. Electronic Signature(s) Signed: 11/22/2021 3:42:30 PM By: Worthy Keeler PA-C Entered By: Worthy Keeler on 11/22/2021 15:42:30 Butler, Aadil L. ([id number]) -------------------------------------------------------------------------------- SuperBill Details Patient Name: Marzella, Agastya L. Date of Service: 11/22/2021 Medical Record Number: [id number] Patient Account Number: [id number] Date of Birth/Sex: 11/11/1965 (55 y.o. 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 Facility Procedures CPT4 Code: 39030092 Description: 33007 - DEB MUSC/FASCIA 20 SQ CM/< Modifier: Quantity: 1 CPT4 Code: Description: ICD-10 Diagnosis Description L97.524 Non-pressure chronic ulcer of other part of left foot with necrosis of b Modifier: one Quantity: Physician Procedures CPT4 Code: 6226333 Description: 99214 - WC PHYS LEVEL 4 - EST PT Modifier: 25 Quantity: 1 CPT4 Code: Description: ICD-10 Diagnosis Description E11.621 Type 2 diabetes mellitus with foot ulcer 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 M86.672 Other chronic osteomyelitis, left ankle and foot I10 Essential (primary)  hypertension Modifier: e Quantity: CPT4 Code: 5456256 Description: 11043 - WC PHYS DEBR MUSCLE/FASCIA 20 SQ CM Modifier: Quantity: 1 CPT4 Code: Description: ICD-10 Diagnosis Description 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 Modifier: e Quantity: Electronic Signature(s) Signed: 11/22/2021 3:43:43 PM By: Worthy Keeler PA-C Previous Signature: 11/22/2021 2:34:13 PM Version By: Donnamarie Poag Entered By: Worthy Keeler on 11/22/2021 15:43:43

## 2021-11-22 NOTE — Progress Notes (Signed)
[name] ([id number]) Visit Report for 11/22/2021 HBO Details Patient Name: [name], [name] Date of Service: 11/22/2021 10:30 AM Medical Record Number: [id number] Patient Account Number: [id number] Date of Birth/Sex: 1966/05/18 (56 y.o. 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 HBO Treatment Details Treatment Number: 12 Patient Type: Outpatient Chamber Type: Monoplace Chamber Serial #: [id number] Treatment Protocol: 2.0 ATA with 90 minutes oxygen, and no air breaks Treatment Details Compression Rate Down: 1.5 psi / minute De-Compression Rate Up: 1.5 psi / minute Compress Tx Pressure Air breaks and breathing periods Decompress Decompress Begins Reached (leave unused spaces blank) Begins Ends Chamber Pressure (ATA) 1 2 - - - - - - 2 1 Clock Time (24 hr) 10:55 11:07 - - - - - - 12:37 12:47 Treatment Length: 112 (minutes) Treatment Segments: 4 Vital Signs Capillary Blood Glucose Reference Range: 80 - 120 mg / dl HBO Diabetic Blood Glucose Intervention Range: <131 mg/dl or >249 mg/dl Time Vitals Blood Respiratory Capillary Blood Glucose Pulse Action Type: Pulse: Temperature: Taken: Pressure: Rate: Glucose (mg/dl): Meter #: Oximetry (%) Taken: Pre 10:40 126/70 72 16 98.5 179 1 none per protocol Post 12:53 142/72 78 16 98.7 132 1 none per protocol Treatment Response Treatment Toleration: Well Treatment Completion Treatment Completed without Adverse Event Status: Electronic Signature(s) Signed: 11/22/2021 1:39:29 PM By: Zackery Barefoot, RRT, CHT Signed: 11/22/2021 4:48:07 PM By: Worthy Keeler  PA-C Entered By: Stark Jock, Amado Nash on 11/22/2021 13:37:54 [name], [name] ([id number]) -------------------------------------------------------------------------------- HBO Safety Checklist Details Patient Name: [name], [name] Date of Service: 11/22/2021 10:30 AM Medical Record Number: [id number] Patient Account Number: [id number] Date of Birth/Sex: 08/23/1966 (56 y.o. M) Treating RN: Donnamarie Poag Primary Care Mercadez Heitman: [name] Other Clinician: Jacqulyn Bath Referring Fermon Ureta: [name] Treating Naleyah Ohlinger/Extender: [name] in Treatment: 3 HBO Safety Checklist Items Safety Checklist Consent Form Signed Patient voided / foley secured and emptied When did you last eato 10:00 am Last dose of injectable or oral agent n/a Ostomy pouch emptied and vented if applicable NA All implantable devices assessed, documented and approved NA Intravenous access site secured and place NA Valuables secured Linens and cotton and cotton/polyester blend (less than 51% polyester) Personal oil-based products / skin lotions / body lotions removed Wigs or hairpieces removed NA Smoking or tobacco materials removed NA Books / newspapers / magazines / loose paper removed NA Cologne, aftershave, perfume and deodorant removed Jewelry removed (may wrap wedding band) Make-up removed NA Hair care products removed Battery operated devices (external) removed NPWT pump Heating patches and chemical warmers removed NA Titanium eyewear removed NA Nail polish cured greater than 10 hours NA Casting material cured greater than 10 hours NA Hearing aids removed NA Loose dentures or partials removed NA Prosthetics have been removed NA Patient demonstrates correct use of air break device (if applicable) Patient concerns have been addressed Patient grounding bracelet on and cord attached to chamber Specifics for Inpatients (complete in addition to above) Medication sheet sent with  patient Intravenous medications needed or due during therapy sent with patient Drainage tubes (e.g. nasogastric tube or chest tube secured and vented) Endotracheal or Tracheotomy tube secured Cuff deflated of air and inflated with saline Airway suctioned Electronic Signature(s) Signed:  11/22/2021 1:39:29 PM By: Enedina Finner RCP, RRT, CHT Entered By: Enedina Finner on 11/22/2021 10:48:14

## 2021-11-25 ENCOUNTER — Other Ambulatory Visit: Payer: Self-pay

## 2021-11-25 ENCOUNTER — Encounter: Payer: Medicare Other | Admitting: Physician Assistant

## 2021-11-25 DIAGNOSIS — Z5181 Encounter for therapeutic drug level monitoring: Secondary | ICD-10-CM | POA: Diagnosis not present

## 2021-11-25 DIAGNOSIS — L97524 Non-pressure chronic ulcer of other part of left foot with necrosis of bone: Secondary | ICD-10-CM | POA: Diagnosis not present

## 2021-11-25 DIAGNOSIS — M86672 Other chronic osteomyelitis, left ankle and foot: Secondary | ICD-10-CM | POA: Diagnosis not present

## 2021-11-25 DIAGNOSIS — Z992 Dependence on renal dialysis: Secondary | ICD-10-CM | POA: Diagnosis not present

## 2021-11-25 DIAGNOSIS — I5042 Chronic combined systolic (congestive) and diastolic (congestive) heart failure: Secondary | ICD-10-CM | POA: Diagnosis not present

## 2021-11-25 DIAGNOSIS — I110 Hypertensive heart disease with heart failure: Secondary | ICD-10-CM | POA: Diagnosis not present

## 2021-11-25 DIAGNOSIS — E1169 Type 2 diabetes mellitus with other specified complication: Secondary | ICD-10-CM | POA: Diagnosis not present

## 2021-11-25 DIAGNOSIS — D631 Anemia in chronic kidney disease: Secondary | ICD-10-CM | POA: Diagnosis not present

## 2021-11-25 DIAGNOSIS — N186 End stage renal disease: Secondary | ICD-10-CM | POA: Diagnosis not present

## 2021-11-25 DIAGNOSIS — N2581 Secondary hyperparathyroidism of renal origin: Secondary | ICD-10-CM | POA: Diagnosis not present

## 2021-11-25 DIAGNOSIS — E11621 Type 2 diabetes mellitus with foot ulcer: Secondary | ICD-10-CM | POA: Diagnosis not present

## 2021-11-25 LAB — GLUCOSE, CAPILLARY
Glucose-Capillary: 121 mg/dL — ABNORMAL HIGH (ref 70–99)
Glucose-Capillary: 162 mg/dL — ABNORMAL HIGH (ref 70–99)
Glucose-Capillary: 141 mg/dL — ABNORMAL HIGH (ref 70–99)

## 2021-11-25 NOTE — Progress Notes (Signed)
[place], [place] ([id number]) Visit Report for 11/25/2021 Arrival Information Details Patient Name: [name], [name]. Date of Service: 11/25/2021 10:30 AM Medical Record Number: [id number] Patient Account Number: [id number] Date of Birth/Sex: December 16, 1965 (56 y.o. M) Treating RN: Donnamarie Poag Primary Care Finlay Mills: [name] Other Clinician: Jacqulyn Bath Referring Deema Juncaj: [name] Treating Nour Rodrigues/Extender: [name] in Treatment: 3 Visit Information History Since Last Visit Added or deleted any medications: No Patient Arrived: Ambulatory Any new allergies or adverse reactions: No Arrival Time: 10:30 Had a fall or experienced change in No Accompanied By: self activities of daily living that may affect Transfer Assistance: None risk of falls: Patient Identification Verified: Yes Signs or symptoms of abuse/neglect since last visito No Secondary Verification Process Completed: Yes Hospitalized since last visit: No Patient Requires Transmission-Based No Implantable device outside of the clinic excluding No Precautions: cellular tissue based products placed in the center Patient Has Alerts: Yes since last visit: Patient Alerts: Patient on Blood Has Dressing in Place as Prescribed: Yes Thinner Has Footwear/Offloading in Place as Prescribed: Yes DIALYSIS RIGHT ARM Left: Surgical Shoe with Pressure Aspirin 81/Brilinta Relief Insole Pain Present Now: No Electronic Signature(s) Signed: 11/25/2021 1:14:58 PM By: Enedina Finner RCP, RRT, CHT Entered By: Stark Jock, Amado Nash on 11/25/2021 11:16:36 Scheller, Mycal L. ([id number]) -------------------------------------------------------------------------------- Encounter Discharge Information Details Patient Name: [name], [name] Date of Service: 11/25/2021 10:30 AM Medical Record Number: [id number] Patient Account Number: [id number] Date of Birth/Sex: Jun 05, 1966 (56 y.o. M) Treating RN: Donnamarie Poag Primary Care  Amberlee Garvey: [name] Other Clinician: Jacqulyn Bath Referring Sayre Mazor: [name] Treating Shiniqua Groseclose/Extender: [name] in Treatment: 3 Encounter Discharge Information Items Discharge Condition: Stable Ambulatory Status: Ambulatory Discharge Destination: Home Transportation: Private Auto Accompanied By: self Schedule Follow-up Appointment: Yes Clinical Summary of Care: Notes Patient has an HBO treatment scheduled on 11/26/21 at 09:00 am. Electronic Signature(s) Signed: 11/25/2021 1:14:58 PM By: Enedina Finner RCP, RRT, CHT Entered By: Stark Jock, Amado Nash on 11/25/2021 13:14:31 Sako, McDade ([id number]) -------------------------------------------------------------------------------- Vitals Details Patient Name: [name], [name] Date of Service: 11/25/2021 10:30 AM Medical Record Number: [id number] Patient Account Number: [id number] Date of Birth/Sex: 24-May-1966 (56 y.o. M) Treating RN: Donnamarie Poag Primary Care Adelisa Satterwhite: [name] Other Clinician: Jacqulyn Bath Referring Elaina Cara: [name] Treating Shilynn Hoch/Extender: [name] in Treatment: 3 Vital Signs Time Taken: 10:45 Temperature (F): 98.1 Height (in): 70 Pulse (bpm): 72 Weight (lbs): 180.4 Respiratory Rate (breaths/min): 16 Body Mass Index (BMI): 25.9 Blood Pressure (mmHg): 132/72 Capillary Blood Glucose (mg/dl): 162 Reference Range: 80 - 120 mg / dl Electronic Signature(s) Signed: 11/25/2021 1:14:58 PM By: Enedina Finner RCP, RRT, CHT Entered By: Enedina Finner on 11/25/2021 11:17:16

## 2021-11-26 ENCOUNTER — Encounter: Payer: Medicare Other | Admitting: Physician Assistant

## 2021-11-26 DIAGNOSIS — B957 Other staphylococcus as the cause of diseases classified elsewhere: Secondary | ICD-10-CM | POA: Diagnosis not present

## 2021-11-26 DIAGNOSIS — B952 Enterococcus as the cause of diseases classified elsewhere: Secondary | ICD-10-CM | POA: Diagnosis not present

## 2021-11-26 DIAGNOSIS — L03116 Cellulitis of left lower limb: Secondary | ICD-10-CM | POA: Diagnosis not present

## 2021-11-26 DIAGNOSIS — I5042 Chronic combined systolic (congestive) and diastolic (congestive) heart failure: Secondary | ICD-10-CM | POA: Diagnosis not present

## 2021-11-26 DIAGNOSIS — M86172 Other acute osteomyelitis, left ankle and foot: Secondary | ICD-10-CM | POA: Diagnosis not present

## 2021-11-26 DIAGNOSIS — E11621 Type 2 diabetes mellitus with foot ulcer: Secondary | ICD-10-CM | POA: Diagnosis not present

## 2021-11-26 DIAGNOSIS — L97524 Non-pressure chronic ulcer of other part of left foot with necrosis of bone: Secondary | ICD-10-CM | POA: Diagnosis not present

## 2021-11-26 DIAGNOSIS — M86672 Other chronic osteomyelitis, left ankle and foot: Secondary | ICD-10-CM | POA: Diagnosis not present

## 2021-11-26 DIAGNOSIS — I110 Hypertensive heart disease with heart failure: Secondary | ICD-10-CM | POA: Diagnosis not present

## 2021-11-26 DIAGNOSIS — E1151 Type 2 diabetes mellitus with diabetic peripheral angiopathy without gangrene: Secondary | ICD-10-CM | POA: Diagnosis not present

## 2021-11-26 DIAGNOSIS — E1169 Type 2 diabetes mellitus with other specified complication: Secondary | ICD-10-CM | POA: Diagnosis not present

## 2021-11-26 DIAGNOSIS — Z4781 Encounter for orthopedic aftercare following surgical amputation: Secondary | ICD-10-CM | POA: Diagnosis not present

## 2021-11-26 LAB — GLUCOSE, CAPILLARY
Glucose-Capillary: 193 mg/dL — ABNORMAL HIGH (ref 70–99)
Glucose-Capillary: 105 mg/dL — ABNORMAL HIGH (ref 70–99)

## 2021-11-26 NOTE — Progress Notes (Signed)
Ca[place], [place] ([id number]) Visit Report for 11/26/2021 HBO Details Patient Name: [name], [name] Date of Service: 11/26/2021 9:00 AM Medical Record Number: [id number] Patient Account Number: [id number] Date of Birth/Sex: 03-14-1966 (56 y.o. 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 HBO Treatment Details Treatment Number: 14 Patient Type: Outpatient Chamber Type: Monoplace Chamber Serial #: [id number] Treatment Protocol: 2.0 ATA with 90 minutes oxygen, and no air breaks Treatment Details Compression Rate Down: 1.5 psi / minute De-Compression Rate Up: 1.5 psi / minute Compress Tx Pressure Air breaks and breathing periods Decompress Decompress Begins Reached (leave unused spaces blank) Begins Ends Chamber Pressure (ATA) 1 2 - - - - - - 2 1 Clock Time (24 hr) 09:11 09:23 - - - - - - 09:53 11:03 Treatment Length: 112 (minutes) Treatment Segments: 4 Vital Signs Capillary Blood Glucose Reference Range: 80 - 120 mg / dl HBO Diabetic Blood Glucose Intervention Range: <131 mg/dl or >249 mg/dl Time Vitals Blood Respiratory Capillary Blood Glucose Pulse Action Type: Pulse: Temperature: Taken: Pressure: Rate: Glucose (mg/dl): Meter #: Oximetry (%) Taken: Pre 08:51 138/68 78 16 98.6 193 1 none per protocol Post 11:14 126/64 72 16 98.8 105 1 none per protocol Treatment Response Treatment Toleration: Well Treatment Completion Treatment Completed without Adverse Event Status: Electronic Signature(s) Signed: 11/26/2021 2:26:09 PM By: Enedina Finner RCP, RRT, CHT Signed: 11/26/2021 4:09:23 PM By: Worthy Keeler  PA-C Previous Signature: 11/26/2021 10:02:31 AM Version By: Worthy Keeler PA-C Entered By: Stark Jock, Amado Nash on 11/26/2021 13:19:31 Thall, Lawton L. ([id number]) -------------------------------------------------------------------------------- HBO Safety Checklist Details Patient Name: [name], [name] Date of Service: 11/26/2021 9:00 AM Medical Record Number: [id number] Patient Account Number: [id number] Date of Birth/Sex: October 20, 1966 (56 y.o. M) Treating RN: Donnamarie Poag Primary Care Kamaile Zachow: [name] Other Clinician: Jacqulyn Bath Referring Taunia Frasco: [name] Treating Shamyia Grandpre/Extender: [name] in Treatment: 3 HBO Safety Checklist Items Safety Checklist Consent Form Signed Patient voided / foley secured and emptied When did you last eato 08:00 AM Last dose of injectable or oral agent N/A Ostomy pouch emptied and vented if applicable NA All implantable devices assessed, documented and approved NA Intravenous access site secured and place NA Valuables secured Linens and cotton and cotton/polyester blend (less than 51% polyester) Personal oil-based products / skin lotions / body lotions removed Wigs or hairpieces removed NA Smoking or tobacco materials removed NA Books / newspapers / magazines / loose paper removed NA Cologne, aftershave, perfume and deodorant removed Jewelry removed (may wrap wedding band) Make-up removed NA Hair care products removed Battery operated devices (external) removed NA Heating patches and chemical warmers removed NA Titanium eyewear removed NA Nail polish cured greater than 10 hours NA Casting material cured greater than 10 hours NA Hearing aids removed NA Loose dentures or partials removed NA Prosthetics have been removed NA Patient demonstrates correct use of air break device (if applicable) Patient concerns have been addressed Patient grounding bracelet on and cord attached to chamber Specifics for  Inpatients (complete in addition to above) Medication sheet sent with patient Intravenous medications needed or due during therapy sent with patient Drainage tubes (e.g. nasogastric tube or chest tube secured and vented) Endotracheal or Tracheotomy tube secured Cuff deflated of  air and inflated with saline Airway suctioned Electronic Signature(s) Signed: 11/26/2021 2:26:09 PM By: Enedina Finner RCP, RRT, CHT Entered By: Enedina Finner on 11/26/2021 09:36:25

## 2021-11-26 NOTE — Progress Notes (Signed)
[name], Opdyke [name] ([id number]) Visit Report for 11/25/2021 HBO Details Patient Name: [name], [name] Date of Service: 11/25/2021 10:30 AM Medical Record Number: [id number] Patient Account Number: [id number] Date of Birth/Sex: 1966-05-16 (56 y.o. 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 HBO Treatment Details Treatment Number: 13 Patient Type: Outpatient Chamber Type: Monoplace Chamber Serial #: [id number] Treatment Protocol: 2.0 ATA with 90 minutes oxygen, and no air breaks Treatment Details Compression Rate Down: 1.5 psi / minute De-Compression Rate Up: 1.5 psi / minute Compress Tx Pressure Air breaks and breathing periods Decompress Decompress Begins Reached (leave unused spaces blank) Begins Ends Chamber Pressure (ATA) 1 2 - - - - - - 2 1 Clock Time (24 hr) 10:52 11:04 - - - - - - 12:35 12:45 Treatment Length: 113 (minutes) Treatment Segments: 4 Vital Signs Capillary Blood Glucose Reference Range: 80 - 120 mg / dl HBO Diabetic Blood Glucose Intervention Range: <131 mg/dl or >249 mg/dl Time Vitals Blood Respiratory Capillary Blood Glucose Pulse Action Type: Pulse: Temperature: Taken: Pressure: Rate: Glucose (mg/dl): Meter #: Oximetry (%) Taken: Pre 10:45 132/72 72 16 98.1 162 1 none per protoccol Post 12:49 142/82 72 16 98.6 141 1 none per protocol Treatment Response Treatment Toleration: Well Treatment Completion Treatment Completed without Adverse Event Status: Electronic Signature(s) Signed: 11/25/2021 1:14:58 PM By: Enedina Finner RCP, RRT, CHT Signed: 11/26/2021 10:02:31 AM By: Worthy Keeler  PA-C Entered By: Stark Jock, Amado Nash on 11/25/2021 13:13:31 [name], [name] ([id number]) -------------------------------------------------------------------------------- HBO Safety Checklist Details Patient Name: [name], [name] Date of Service: 11/25/2021 10:30 AM Medical Record Number: [id number] Patient Account Number: [id number] Date of Birth/Sex: 08-24-1966 (56 y.o. M) Treating RN: Donnamarie Poag Primary Care Nhan Qualley: [name] Other Clinician: Jacqulyn Bath Referring Jakyiah Briones: [name] Treating Britini Garcilazo/Extender: [name] in Treatment: 3 HBO Safety Checklist Items Safety Checklist Consent Form Signed Patient voided / foley secured and emptied When did you last eato 10:30 am Last dose of injectable or oral agent n/a Ostomy pouch emptied and vented if applicable NA All implantable devices assessed, documented and approved NA Intravenous access site secured and place NA Valuables secured Linens and cotton and cotton/polyester blend (less than 51% polyester) Personal oil-based products / skin lotions / body lotions removed Wigs or hairpieces removed NA Smoking or tobacco materials removed NA Books / newspapers / magazines / loose paper removed NA Cologne, aftershave, perfume and deodorant removed Jewelry removed (may wrap wedding band) Make-up removed NA Hair care products removed Battery operated devices (external) removed NA Heating patches and chemical warmers removed NA Titanium eyewear removed NA Nail polish cured greater than 10 hours NA Casting material cured greater than 10 hours NA Hearing aids removed NA Loose dentures or partials removed NA Prosthetics have been removed NA Patient demonstrates correct use of air break device (if applicable) Patient concerns have been addressed Patient grounding bracelet on and cord attached to chamber Specifics for Inpatients (complete in addition to above) Medication sheet sent with  patient Intravenous medications needed or due during therapy sent with patient Drainage tubes (e.g. nasogastric tube or chest tube secured and vented) Endotracheal or Tracheotomy tube secured Cuff deflated of air and inflated with saline Airway suctioned Electronic Signature(s) Signed: 11/25/2021  1:14:58 PM By: Enedina Finner RCP, RRT, CHT Entered By: Enedina Finner on 11/25/2021 11:39:58

## 2021-11-26 NOTE — Progress Notes (Signed)
L[place], [place] ([id number]) Visit Report for 11/26/2021 Arrival Information Details Patient Name: [name], [name]. Date of Service: 11/26/2021 9:00 AM Medical Record Number: [id number] Patient Account Number: [id number] Date of Birth/Sex: 10/14/1966 (56 y.o. M) Treating RN: Donnamarie Poag Primary Care Donae Kueker: [name] Other Clinician: Jacqulyn Bath Referring Kiira Brach: [name] Treating Deandrew Hoecker/Extender: [name] in Treatment: 3 Visit Information History Since Last Visit Added or deleted any medications: No Patient Arrived: Ambulatory Any new allergies or adverse reactions: No Arrival Time: 08:50 Had a fall or experienced change in No Accompanied By: self activities of daily living that may affect Transfer Assistance: None risk of falls: Patient Identification Verified: Yes Signs or symptoms of abuse/neglect since last visito No Secondary Verification Process Completed: Yes Hospitalized since last visit: No Patient Requires Transmission-Based No Implantable device outside of the clinic excluding No Precautions: cellular tissue based products placed in the center Patient Has Alerts: Yes since last visit: Patient Alerts: Patient on Blood Pain Present Now: No Thinner DIALYSIS RIGHT ARM Aspirin 81/Brilinta Electronic Signature(s) Signed: 11/26/2021 2:26:09 PM By: Enedina Finner RCP, RRT, CHT Entered By: Stark Jock, Amado Nash on 11/26/2021 09:13:39 [name], [name] ([id number]) -------------------------------------------------------------------------------- Encounter Discharge Information Details Patient Name: [name], [name] Date of Service: 11/26/2021 9:00 AM Medical Record Number: [id number] Patient Account Number: [id number] Date of Birth/Sex: [birth year] (56 y.o. M) Treating RN: Donnamarie Poag Primary Care Antania Hoefling: [name] Other Clinician: Jacqulyn Bath Referring Keric [name]: [name] Treating Mattie Nordell/Extender: [name] in  Treatment: 3 Encounter Discharge Information Items Discharge Condition: Stable Ambulatory Status: Ambulatory Discharge Destination: Home Transportation: Private Auto Accompanied By: self Schedule Follow-up Appointment: Yes Clinical Summary of Care: Notes Patient has an HBO treatment scheduled on 11/27/21 at 10:30 am. Electronic Signature(s) Signed: 11/26/2021 2:26:09 PM By: Enedina Finner RCP, RRT, CHT Entered By: Stark Jock, Amado Nash on 11/26/2021 13:20:26 [name], [name] ([id number]) -------------------------------------------------------------------------------- Vitals Details Patient Name: [name], [name] Date of Service: 11/26/2021 9:00 AM Medical Record Number: [id number] Patient Account Number: [id number] Date of Birth/Sex: [birth year] (56 y.o. M) Treating RN: Donnamarie Poag Primary Care Matisse Roskelley: [name] Other Clinician: Jacqulyn Bath Referring Daine Croker: [name] Treating Anik Wesch/Extender: [name] in Treatment: 3 Vital Signs Time Taken: 08:51 Temperature (F): 98.6 Height (in): 70 Pulse (bpm): 78 Weight (lbs): 180.4 Respiratory Rate (breaths/min): 16 Body Mass Index (BMI): 25.9 Blood Pressure (mmHg): 138/68 Capillary Blood Glucose (mg/dl): 193 Reference Range: 80 - 120 mg / dl Electronic Signature(s) Signed: 11/26/2021 2:26:09 PM By: Enedina Finner RCP, RRT, CHT Entered By: Enedina Finner on 11/26/2021 09:34:44

## 2021-11-27 ENCOUNTER — Encounter (HOSPITAL_BASED_OUTPATIENT_CLINIC_OR_DEPARTMENT_OTHER): Payer: Medicare Other | Admitting: Internal Medicine

## 2021-11-27 ENCOUNTER — Other Ambulatory Visit: Payer: Self-pay

## 2021-11-27 DIAGNOSIS — Z992 Dependence on renal dialysis: Secondary | ICD-10-CM | POA: Diagnosis not present

## 2021-11-27 DIAGNOSIS — L97524 Non-pressure chronic ulcer of other part of left foot with necrosis of bone: Secondary | ICD-10-CM

## 2021-11-27 DIAGNOSIS — E11621 Type 2 diabetes mellitus with foot ulcer: Secondary | ICD-10-CM

## 2021-11-27 DIAGNOSIS — E1169 Type 2 diabetes mellitus with other specified complication: Secondary | ICD-10-CM | POA: Diagnosis not present

## 2021-11-27 DIAGNOSIS — D631 Anemia in chronic kidney disease: Secondary | ICD-10-CM | POA: Diagnosis not present

## 2021-11-27 DIAGNOSIS — I5042 Chronic combined systolic (congestive) and diastolic (congestive) heart failure: Secondary | ICD-10-CM | POA: Diagnosis not present

## 2021-11-27 DIAGNOSIS — M86672 Other chronic osteomyelitis, left ankle and foot: Secondary | ICD-10-CM

## 2021-11-27 DIAGNOSIS — N2581 Secondary hyperparathyroidism of renal origin: Secondary | ICD-10-CM | POA: Diagnosis not present

## 2021-11-27 DIAGNOSIS — I110 Hypertensive heart disease with heart failure: Secondary | ICD-10-CM | POA: Diagnosis not present

## 2021-11-27 DIAGNOSIS — N186 End stage renal disease: Secondary | ICD-10-CM | POA: Diagnosis not present

## 2021-11-27 LAB — GLUCOSE, CAPILLARY
Glucose-Capillary: 198 mg/dL — ABNORMAL HIGH (ref 70–99)
Glucose-Capillary: 163 mg/dL — ABNORMAL HIGH (ref 70–99)

## 2021-11-27 NOTE — Progress Notes (Signed)
[place], [place] ([id number]) Visit Report for 11/27/2021 HBO Details Patient Name: [name], [name] Date of Service: 11/27/2021 10:30 AM Medical Record Number: [id number] Patient Account Number: [id number] Date of Birth/Sex: Feb 12, 1966 (56 y.o. 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 HBO Treatment Details Treatment Number: 15 Patient Type: Outpatient Chamber Type: Monoplace Chamber Serial #: [id number] Treatment Protocol: 2.0 ATA with 90 minutes oxygen, and no air breaks Treatment Details Compression Rate Down: 1.5 psi / minute De-Compression Rate Up: 1.5 psi / minute Compress Tx Pressure Air breaks and breathing periods Decompress Decompress Begins Reached (leave unused spaces blank) Begins Ends Chamber Pressure (ATA) 1 2 - - - - - - 2 1 Clock Time (24 hr) 11:03 11:15 - - - - - - 12:45 12:55 Treatment Length: 112 (minutes) Treatment Segments: 4 Vital Signs Capillary Blood Glucose Reference Range: 80 - 120 mg / dl HBO Diabetic Blood Glucose Intervention Range: <131 mg/dl or >249 mg/dl Time Vitals Blood Respiratory Capillary Blood Glucose Pulse Action Type: Pulse: Temperature: Taken: Pressure: Rate: Glucose (mg/dl): Meter #: Oximetry (%) Taken: Pre 10:54 138/68 72 16 98.4 198 1 none per protocol Post 12:59 138/78 72 16 98.1 163 1 none per protocol Treatment Response Treatment Toleration: Well Treatment Completion Treatment Completed without Adverse Event Status: HBO Attestation I certify that I supervised this HBO treatment in accordance with Medicare guidelines. A trained emergency response team is readily  Yes available per hospital policies and procedures. Continue HBOT as ordered. Yes Electronic Signature(s) Signed: 11/27/2021 2:01:41 PM By: Kalman Shan DO Previous Signature: 11/27/2021 1:23:52 PM Version By: Enedina Finner RCP, RRT, CHT Entered By: Kalman Shan on 11/27/2021 14:01:05 [name] ([id number]) -------------------------------------------------------------------------------- HBO Safety Checklist Details Patient Name: [name], [name] Date of Service: 11/27/2021 10:30 AM Medical Record Number: [id number] Patient Account Number: [id number] Date of Birth/Sex: 1966/05/05 (56 y.o. M) Treating RN: Donnamarie Poag Primary Care Yanai Hobson: [name] Other Clinician: Jacqulyn Bath Referring Shakthi Scipio: [name] Treating Brynne Doane/Extender: Yaakov Guthrie in Treatment: 3 HBO Safety Checklist Items Safety Checklist Consent Form Signed Patient voided / foley secured and emptied When did you last eato 10:00 am Last dose of injectable or oral agent n/a Ostomy pouch emptied and vented if applicable NA All implantable devices assessed, documented and approved NA Intravenous access site secured and place NA Valuables secured Linens and cotton and cotton/polyester blend (less than 51% polyester) Personal oil-based products / skin lotions / body lotions removed Wigs or hairpieces removed NA Smoking or tobacco materials removed NA Books / newspapers / magazines / loose paper removed NA Cologne, aftershave, perfume and deodorant removed Jewelry removed (may wrap wedding band) Make-up removed NA Hair care products removed Battery operated devices (external) removed NA Heating patches and chemical warmers removed NA Titanium eyewear removed NA Nail polish cured greater than 10 hours NA Casting material cured greater than 10 hours NA Hearing aids removed NA Loose dentures or partials removed NA Prosthetics have been removed NA Patient  demonstrates correct use of air break device (if applicable) Patient concerns have been addressed Patient grounding bracelet on and cord attached to chamber Specifics for Inpatients (complete in addition to above) Medication sheet sent with patient Intravenous medications needed or due during  therapy sent with patient Drainage tubes (e.g. nasogastric tube or chest tube secured and vented) Endotracheal or Tracheotomy tube secured Cuff deflated of air and inflated with saline Airway suctioned Electronic Signature(s) Signed: 11/27/2021 1:23:52 PM By: Enedina Finner RCP, RRT, CHT Entered By: Enedina Finner on 11/27/2021 11:10:49

## 2021-11-27 NOTE — Progress Notes (Signed)
Janusz[place], [place] ([id number]) Visit Report for 11/27/2021 Arrival Information Details Patient Name: [name], [name]. Date of Service: 11/27/2021 10:30 AM Medical Record Number: [id number] Patient Account Number: [id number] Date of Birth/Sex: 1966-06-01 (56 y.o. M) Treating RN: Donnamarie Poag Primary Care Sheniqua Carolan: [name] Other Clinician: Jacqulyn Bath Referring Jevaeh Shams: [name] Treating Toney Lizaola/Extender: Yaakov Guthrie in Treatment: 3 Visit Information History Since Last Visit Added or deleted any medications: No Patient Arrived: Ambulatory Any new allergies or adverse reactions: No Arrival Time: 11:07 Had a fall or experienced change in No Accompanied By: self activities of daily living that may affect Transfer Assistance: None risk of falls: Patient Identification Verified: Yes Signs or symptoms of abuse/neglect since last visito No Secondary Verification Process Completed: Yes Hospitalized since last visit: No Patient Requires Transmission-Based No Implantable device outside of the clinic excluding No Precautions: cellular tissue based products placed in the center Patient Has Alerts: Yes since last visit: Patient Alerts: Patient on Blood Has Dressing in Place as Prescribed: Yes Thinner Has Footwear/Offloading in Place as Prescribed: Yes DIALYSIS RIGHT ARM Left: Surgical Shoe with Pressure Aspirin 81/Brilinta Relief Insole Pain Present Now: Yes Electronic Signature(s) Signed: 11/27/2021 1:23:52 PM By: Enedina Finner RCP, RRT, CHT Entered By: Stark Jock, Amado Nash on 11/27/2021 11:08:33 [name], [name] ([id number]) -------------------------------------------------------------------------------- Encounter Discharge Information Details Patient Name: [name], [name] Date of Service: 11/27/2021 10:30 AM Medical Record Number: [id number] Patient Account Number: [id number] Date of Birth/Sex: January 29, 1966 (56 y.o. M) Treating RN: Donnamarie Poag Primary  Care Vitalia Stough: [name] Other Clinician: Jacqulyn Bath Referring Samika Vetsch: [name] Treating Euan Wandler/Extender: Yaakov Guthrie in Treatment: 3 Encounter Discharge Information Items Discharge Condition: Stable Ambulatory Status: Ambulatory Discharge Destination: Home Transportation: Private Auto Accompanied By: self Schedule Follow-up Appointment: Yes Clinical Summary of Care: Notes Patient has an HBO treatment scheduled on 11/28/21 at 09:00 am. Electronic Signature(s) Signed: 11/27/2021 1:23:52 PM By: Enedina Finner RCP, RRT, CHT Entered By: Stark Jock, Amado Nash on 11/27/2021 13:23:23 [name], [name] ([id number]) -------------------------------------------------------------------------------- Vitals Details Patient Name: [name], [name] Date of Service: 11/27/2021 10:30 AM Medical Record Number: [id number] Patient Account Number: [id number] Date of Birth/Sex: 1966-01-26 (56 y.o. M) Treating RN: Donnamarie Poag Primary Care Brogan Martis: [name] Other Clinician: Jacqulyn Bath Referring Eustacia Urbanek: [name] Treating Christhoper Busbee/Extender: Yaakov Guthrie in Treatment: 3 Vital Signs Time Taken: 10:54 Temperature (F): 98.4 Height (in): 70 Pulse (bpm): 72 Weight (lbs): 180.4 Respiratory Rate (breaths/min): 16 Body Mass Index (BMI): 25.9 Blood Pressure (mmHg): 138/68 Capillary Blood Glucose (mg/dl): 198 Reference Range: 80 - 120 mg / dl Electronic Signature(s) Signed: 11/27/2021 1:23:52 PM By: Enedina Finner RCP, RRT, CHT Entered By: Enedina Finner on 11/27/2021 11:09:31

## 2021-11-28 ENCOUNTER — Encounter: Payer: Medicare Other | Admitting: Physician Assistant

## 2021-11-28 DIAGNOSIS — I5042 Chronic combined systolic (congestive) and diastolic (congestive) heart failure: Secondary | ICD-10-CM | POA: Diagnosis not present

## 2021-11-28 DIAGNOSIS — E1169 Type 2 diabetes mellitus with other specified complication: Secondary | ICD-10-CM | POA: Diagnosis not present

## 2021-11-28 DIAGNOSIS — I110 Hypertensive heart disease with heart failure: Secondary | ICD-10-CM | POA: Diagnosis not present

## 2021-11-28 DIAGNOSIS — E11621 Type 2 diabetes mellitus with foot ulcer: Secondary | ICD-10-CM | POA: Diagnosis not present

## 2021-11-28 DIAGNOSIS — T8189XA Other complications of procedures, not elsewhere classified, initial encounter: Secondary | ICD-10-CM | POA: Diagnosis not present

## 2021-11-28 DIAGNOSIS — M86672 Other chronic osteomyelitis, left ankle and foot: Secondary | ICD-10-CM | POA: Diagnosis not present

## 2021-11-28 DIAGNOSIS — L97524 Non-pressure chronic ulcer of other part of left foot with necrosis of bone: Secondary | ICD-10-CM | POA: Diagnosis not present

## 2021-11-28 LAB — GLUCOSE, CAPILLARY
Glucose-Capillary: 185 mg/dL — ABNORMAL HIGH (ref 70–99)
Glucose-Capillary: 109 mg/dL — ABNORMAL HIGH (ref 70–99)

## 2021-11-28 NOTE — Progress Notes (Addendum)
[place], [place] ([id number]) Visit Report for 11/28/2021 Chief Complaint Document Details Patient Name: [name] [name]. Date of Service: 11/28/2021 11:30 AM Medical Record Number: [id number] Patient Account Number: [id number] Date of Birth/Sex: 08/30/1966 (56 y.o. M) Treating RN: Cornell Barman Primary Care Provider: [name] Other Clinician: Referring Provider: [name] Treating Provider/Extender: [name] in Treatment: 4 Information Obtained from: Patient Chief Complaint HBO evaluation for treatment on the left foot diabetic ulcer with osteomyelitis. Patient is currently under our care as well for his foot. Electronic Signature(s) Signed: 11/28/2021 9:08:41 AM By: Worthy Keeler PA-C Previous Signature: 11/28/2021 9:07:14 AM Version By: Worthy Keeler PA-C Entered By: Worthy Keeler on 11/28/2021 09:08:40 [name]. ([id number]) -------------------------------------------------------------------------------- Debridement Details Patient Name: [name]. Date of Service: 11/28/2021 11:30 AM Medical Record Number: [id number] Patient Account Number: [id number] Date of Birth/Sex: Apr 15, 1966 (56 y.o. M) Treating RN: Cornell Barman Primary Care Provider: [name] Other Clinician: Referring Provider: [name] Treating Provider/Extender: [name] in Treatment: 4 Debridement Performed for Wound #1 Left Amputation Site - Digit Assessment: Performed By: Physician Tommie Sams., PA-C Debridement Type: Debridement Level of Consciousness (Pre- Awake and Alert procedure): Pre-procedure Verification/Time Out Yes - 09:15 Taken: Total Area Debrided ([name] x W): 6.8 (cm) x 2.4 (cm) = 16.32 (cm) Tissue and other material Viable, Non-Viable, Slough, Subcutaneous, Tendon, Biofilm, Slough debrided: Level: Skin/Subcutaneous Tissue/Muscle Debridement Description: Excisional Instrument: Curette Bleeding: Moderate Hemostasis Achieved: Pressure Response to  Treatment: Procedure was tolerated well Level of Consciousness (Post- Awake and Alert procedure): Post Debridement Measurements of Total Wound Length: (cm) 6.8 Width: (cm) 2.4 Depth: (cm) 3 Volume: (cm) 38.453 Character of Wound/Ulcer Post Debridement: Stable Post Procedure Diagnosis Same as Pre-procedure Electronic Signature(s) Signed: 11/28/2021 9:18:36 AM By: Gretta Cool, BSN, RN, CWS, Kim RN, BSN Signed: 11/28/2021 5:14:54 PM By: Worthy Keeler PA-C Previous Signature: 11/28/2021 9:16:00 AM Version By: Gretta Cool, BSN, RN, CWS, Kim RN, BSN Entered By: Gretta Cool, BSN, RN, CWS, Kim on 11/28/2021 09:18:35 [name]. ([id number]) -------------------------------------------------------------------------------- HPI Details Patient Name: [name]. Date of Service: 11/28/2021 11:30 AM Medical Record Number: [id number] Patient Account Number: [id number] Date of Birth/Sex: 07-09-1966 (56 y.o. M) Treating RN: Cornell Barman Primary Care Provider: [name] Other Clinician: Referring Provider: [name] Treating Provider/Extender: [name] in Treatment: 4 History of Present Illness HPI Description: 10/31/2021 patient presents today for evaluation here in our clinic. He is actually being evaluated for hyperbaric oxygen therapy only. He has been seen at [facility] up to this point he is under the care of the vascular surgery specialty. Subsequently he is also under the care of the hyperbaric center there. With that being said that due to the fact that he is a dialysis patient he was actually recommended to be transferred to [place] for his treatments he actually lives in [place] which is where his dialysis is. It was a hardship for him to try to get from [place] especially on dialysis days all the way to [facility] get into the facility for his treatment and get back home he was literally spending all day long. He has had just 4 treatments there at [facility] thus far based on what I see  in these have not been continued while he awaits approval here in our clinic. Subsequently I do have records for review that will be included in the HPI predetermination review and attached to this chart as well. I will not duplicate that here. 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 Nonetheless he continues to have exposed bone with marked soft tissue swelling compatible with osteomyelitis. This is due to the irregularity of the head of the metatarsal as well. Nonetheless along with having associated cellulitis he is also good to be seen by infectious disease and is currently on antibiotics for this as well. Again that will be detailed in the pretreatment review section which I will attach to this note as well. He does currently have a wound VAC in place and again the wound was not evaluated here in the clinic as that is being completely managed by home health and [facility]. The patient does have a history of diabetes mellitus type 2, hypertension, coronary artery disease, and congestive heart failure. He also has cataracts of both eyes but no evidence of glaucoma on his most recent eye exam. 11/22/2021 we have been seeing this gentleman for hyperbaric oxygen therapy but have not actually evaluated his wound up to this point this was being managed by the wound care and vascular team I presumed at [place] at least that is what has been told to be previous. Nonetheless at this point I got a call from Atlee Abide who is a Librarian, academic at the [facility] vascular clinic with Dr. Durene Fruits and subsequently they were wanting to know if I could take over wound care for this patient. Apparently they do not have the ability for an office debridement which again I completely understand but nonetheless definitely is not an integral part of his healing along  with the hyperbarics. Subsequently I discussed with the patient today that I do believe he would benefit going ahead and proceeding with evaluation here in the clinic for that reason I did go ahead and see him today to get things started. There is also been some confusion about the issues with his home health nurse and getting measurements to [facility] for the wound VAC in order to continue his wound VAC therapy. With that being said after I see him today we will make a determination on what to do with wound VAC we can definitely send measurements to [facility] but again the bigger question is whether this is the best way to go or not. 11/28/2021 upon evaluation today patient appears to be doing decently well in regard to his wound. He has been tolerating the dressing changes with the Dakin's moistened gauze dressing which I think is doing a good job. Fortunately I do not see any signs of active infection locally nor systemically at this point. In general I think that he is actually making excellent progress which is great news. Electronic Signature(s) Signed: 11/28/2021 10:40:17 AM By: Worthy Keeler PA-C Entered By: Worthy Keeler on 11/28/2021 10:40:17 [name]. ([id number]) -------------------------------------------------------------------------------- Physical Exam Details Patient Name: [name]. Date of Service: 11/28/2021 11:30 AM Medical Record Number: [id number] Patient Account Number: [id number] Date of Birth/Sex: 03-08-1966 (56 y.o. M) Treating RN: Cornell Barman Primary Care Provider: [name] Other Clinician: Referring Provider: [name] Treating Provider/Extender: [name] in Treatment: 4 Constitutional Well-nourished and well-hydrated in no acute distress. Respiratory normal breathing without difficulty. Psychiatric this patient is able to make decisions and demonstrates good insight into disease process. Alert and Oriented x 3. pleasant and  cooperative. Notes Upon inspection patient's wound bed showed signs of good  granulation epithelization at this point. I do not see any signs of worsening and in general I think that the Dakin's is helping to clean up the surface of the wound quite well. I am very pleased with the overall appearance of the wound bed today. Electronic Signature(s) Signed: 11/28/2021 10:40:41 AM By: Worthy Keeler PA-C Entered By: Worthy Keeler on 11/28/2021 10:40:40 [name]. ([id number]) -------------------------------------------------------------------------------- Physician Orders Details Patient Name: [name]. Date of Service: 11/28/2021 11:30 AM Medical Record Number: [id number] Patient Account Number: [id number] Date of Birth/Sex: [birth year] (56 y.o. M) Treating RN: Cornell Barman Primary Care Provider: [name] Other Clinician: Referring Provider: [name] Treating Provider/Extender: [name] in Treatment: 4 Verbal / Phone Orders: No Diagnosis Coding ICD-10 Coding Code Description E11.621 Type 2 diabetes mellitus with foot ulcer 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e M86.672 Other chronic osteomyelitis, left ankle and foot I10 Essential (primary) hypertension I25.10 Atherosclerotic heart disease of native coronary artery without angina pectoris I50.42 Chronic combined systolic (congestive) and diastolic (congestive) heart failure Follow-up Appointments o Return Appointment in 1 week. - provider visits at wound center o Return Appointment in: - HBO- (daily Monday -Friday for HBO Treatments) [employer] for wound care. May utilize formulary equivalent dressing for wound treatment orders unless otherwise specified. Home Health Nurse may visit [facility] to address patientos wound care needs. o **Please direct any NON-WOUND related issues/requests for orders to  patient's Primary Care Physician. **If current dressing causes regression in wound condition, may D/C ordered dressing product/s and apply Normal Saline Moist Dressing daily until next [place] or Other MD appointment. **Notify Wound Healing Center of regression in wound condition at [phone]. o Other Home Health Orders/Instructions: - HOLD WOUND VAC AT THIS TIME Bathing/ Shower/ Hygiene o Clean wound with Normal Saline or wound cleanser. o May shower with wound dressing protected with water repellent cover or cast protector. o No tub bath. Additional Orders / Instructions o Follow Nutritious Diet and Increase Protein Intake o Other: - Follow vascular, nephrology and PCP for wound care and other needs Dialysis appt as scheduled M-W-F Hyperbaric Oxygen Therapy o Indication and location: - 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 o Follow Hyperbaric Oxygen Glycemia Protocol Wound Treatment Wound #1 - Amputation Site - Digit Wound Laterality: Left Cleanser: Normal Saline 1 x Per Day/30 Days Discharge Instructions: Wash your hands with soap and water. Remove old dressing, discard into plastic bag and place into trash. Cleanse the wound with Normal Saline prior to applying a clean dressing using gauze sponges, not tissues or cotton balls. Do not scrub or use excessive force. Pat dry using gauze sponges, not tissue or cotton balls. Cleanser: Vashe 5.8 (oz) 1 x Per Day/30 Days Discharge Instructions: Use vashe 5.8 (oz) as directed Cleanser: Wound Cleanser 1 x Per Day/30 Days Discharge Instructions: Wash your hands with soap and water. Remove old  dressing, discard into plastic bag and place into trash. Cleanse the wound with Wound Cleanser prior to applying a clean dressing using gauze sponges, not tissues or cotton balls. Do not scrub  or use excessive force. Pat dry using gauze sponges, not tissue or cotton balls. [name], [name] ([id number]) Primary Dressing: Gauze 1 x Per Day/30 Days Discharge Instructions: VASHE/DAKINS-As directed: dry, moistened with saline or moistened with Dakins Solution Secondary Dressing: ABD Pad 5x9 (in/in) 1 x Per Day/30 Days Discharge Instructions: Cover with ABD pad Secured With: 31M Medipore H Soft Cloth Surgical Tape, 2x2 (in/yd) 1 x Per Day/30 Days Secured With: Kerlix Roll Sterile or Non-Sterile 6-ply 4.5x4 (yd/yd) 1 x Per Day/30 Days Discharge Instructions: Apply Kerlix as directed Wound #2 - Foot Wound Laterality: Left, Lateral Topical: Betadine Discharge Instructions: Apply betadine as directed. Primary Dressing: Gauze Discharge Instructions: As directed: dry, moistened with saline or moistened with Dakins Solution Secondary Dressing: ABD Pad 5x9 (in/in) Discharge Instructions: Cover with ABD pad [place] pre-HBO capillary blood glucose (ensure 1 physician order is in chart). A. Notify HBO physician and await physician orders. 2 If result is 70 mg/dl or below: B. If the result meets the hospital definition of a critical result, follow hospital policy. A. Give patient an 8 ounce Glucerna Shake, an 8 ounce Ensure, or 8 ounces of a Glucerna/Ensure equivalent dietary supplement*. B. Wait 30 minutes. If result is 71 mg/dl to 130 mg/dl: C. Retest patientos capillary blood glucose (CBG). D. If result greater than or equal to 110 mg/dl, proceed with HBO. If result less than 110 mg/dl, notify HBO physician and consider holding HBO. If result is 131 mg/dl to 249 mg/dl: A. Proceed with HBO. A. Notify HBO  physician and await physician orders. B. It is recommended to hold HBO and do If result is 250 mg/dl or greater: blood/urine ketone testing. C. If the result meets the hospital definition of a critical result, follow hospital policy. POST-HBO GLYCEMIA INTERVENTIONS ACTION INTERVENTION Obtain post HBO capillary blood glucose (ensure 1 physician order is in chart). A. Notify HBO physician and await physician orders. 2 If result is 70 mg/dl or below: B. If the result meets the hospital definition of a critical result, follow hospital policy. A. Give patient an 8 ounce Glucerna Shake, an 8 ounce Ensure, or 8 ounces of a Glucerna/Ensure equivalent dietary supplement*. B. Wait 15 minutes for symptoms of hypoglycemia (i.e. nervousness, anxiety, If result is 71 mg/dl to 100 mg/dl: sweating, chills, clamminess, irritability, confusion, tachycardia or dizziness). C. If patient asymptomatic, discharge patient. If patient symptomatic, repeat capillary blood glucose (CBG) and notify HBO physician. If result is 101 mg/dl to 249 mg/dl: A. Discharge patient. [place], [name] ([id number]) If result is 250 mg/dl or greater: A. Notify HBO physician and await physician orders. B. It is recommended to do blood/urine ketone testing. C. If the result meets the hospital definition of a critical result, follow hospital policy. *Juice or candies are NOT equivalent products. If patient refuses the Glucerna or Ensure, please consult the hospital dietitian for an appropriate substitute. Electronic Signature(s) Signed: 11/28/2021 9:18:06 AM By: Gretta Cool, BSN, RN, CWS, Kim RN, BSN Signed: 11/28/2021 5:14:54 PM By: Worthy Keeler PA-C Entered By: Gretta Cool, BSN, RN, CWS, Kim on 11/28/2021 09:18:06 [place]ce, [name] ([id number]) -------------------------------------------------------------------------------- Problem List Details Patient Name: Broxson, [name]. Date of Service: 11/28/2021 11:30 AM Medical Record  Number: [id number] Patient Account Number: [id number] Date of Birth/Sex: [birth year] (56 y.o. 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 ([id number]) -------------------------------------------------------------------------------- Progress Note Details Patient Name: [name]. Date of Service: 11/28/2021 11:30 AM Medical Record Number: [id number] Patient Account Number: [id number] Date of Birth/Sex: 03-20-1966 (56 y.o. M) Treating RN: Cornell Barman Primary Care Provider: [name] Other Clinician: Referring Provider: [name] Treating Provider/Extender: [name] in Treatment: 4 Subjective Chief Complaint Information obtained from Patient HBO evaluation for treatment on the left foot diabetic ulcer with osteomyelitis. Patient is currently under our care as well for his foot. History of Present Illness (HPI) 10/31/2021 patient presents today for evaluation here in our clinic. He is actually being evaluated for hyperbaric oxygen therapy only. He has been seen at [facility] up to  this point he is under the care of the vascular surgery specialty. Subsequently he is also under the care of the hyperbaric center there. With that being said that due to the fact that he is a dialysis patient he was actually recommended to be transferred to [place] for his treatments he actually lives in [place] which is where his dialysis is. It was a hardship for him to try to get from [place] especially on dialysis days all the way to [facility] get into the facility for his treatment and get back home he was literally spending all day long. He has had just 4 treatments there at [facility] thus far based on what I see in these have not been continued while he awaits approval here in our clinic. Subsequently I do have records for review that will be included in the HPI predetermination review and attached to this chart as well. I will not duplicate that here. 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 Nonetheless he continues to have exposed bone with marked soft tissue swelling compatible with osteomyelitis. This is due to the irregularity of the head of the metatarsal as well. Nonetheless along with having associated cellulitis he is also good to be seen by infectious disease and is currently on antibiotics for this as well. Again that will be detailed in the pretreatment review section which I will attach to this note as well. He does currently have a wound VAC in place and again the wound was not evaluated here in the clinic as that is being completely managed by home health and [facility]. The patient does have a history of diabetes mellitus type 2, hypertension, coronary artery disease, and congestive heart failure. He also has cataracts of both eyes but no evidence of glaucoma on his most recent eye exam. 11/22/2021 we have been seeing this  gentleman for hyperbaric oxygen therapy but have not actually evaluated his wound up to this point this was being managed by the wound care and vascular team I presumed at [place] at least that is  what has been told to be previous. Nonetheless at this point I got a call from Atlee Abide who is a Librarian, academic at the [facility] vascular clinic with Dr. Durene Fruits and subsequently they were wanting to know if I could take over wound care for this patient. Apparently they do not have the ability for an office debridement which again I completely understand but nonetheless definitely is not an integral part of his healing along with the hyperbarics. Subsequently I discussed with the patient today that I do believe he would benefit going ahead and proceeding with evaluation here in the clinic for that reason I did go ahead and see him today to get things started. There is also been some confusion about the issues with his home health nurse and getting measurements to [facility] for the wound VAC in order to continue his wound VAC therapy. With that being said after I see him today we will make a determination on what to do with wound VAC we can definitely send measurements to [facility] but again the bigger question is whether this is the best way to go or not. 11/28/2021 upon evaluation today patient appears to be doing decently well in regard to his wound. He has been tolerating the dressing changes with the Dakin's moistened gauze dressing which I think is doing a good job. Fortunately I do not see any signs of active infection locally nor systemically at this point. In general I think that he is actually making excellent progress which is great news. Objective Constitutional Well-nourished and well-hydrated in no acute distress. Vitals Time Taken: 8:19 AM, Height: 70 in, Weight: 180.4 lbs, BMI: 25.9, Temperature: 97.8 F, Pulse: 72 bpm, Respiratory Rate: 16 breaths/min, Blood Pressure: 138/68 mmHg, Capillary Blood  Glucose: 185 mg/dl. Respiratory normal breathing without difficulty. Psychiatric this patient is able to make decisions and demonstrates good insight into disease process. Alert and Oriented x 3. pleasant and cooperative. General Notes: Upon inspection patient's wound bed showed signs of good granulation epithelization at this point. I do not see any signs of worsening and in general I think that the Dakin's is helping to clean up the surface of the wound quite well. I am very pleased with the overall appearance of the wound bed today. [place], [place] ([id number]) Integumentary (Hair, Skin) Wound #1 status is Open. Original cause of wound was Surgical Injury. The date acquired was: 09/09/2021. 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 The wound measures 6.8cm length x 2.4cm width x 2.9cm depth; 12.818cm^2 area and 37.171cm^3 volume. There is muscle, tendon, Fat Layer (Subcutaneous Tissue), and fascia exposed. There is tunneling at 9:00 with a maximum distance of 1cm. There is additional tunneling and at 2:00 with a maximum distance of 1.2cm. There is a large amount of serosanguineous drainage noted. There is small (1-33%) red granulation within the wound bed. There is a medium (34-66%) amount of necrotic tissue within the wound bed including Adherent Slough and Necrosis of Muscle. Wound #2 status is Open. Original cause of wound was Blister. The date acquired was: 10/28/2021. The wound is located on the Left,Lateral Foot. The wound measures 2cm length x 2cm width x 0.1cm depth; 3.142cm^2 area and 0.314cm^3 volume. There is no tunneling or undermining noted. There is a none present amount of drainage noted. There is no granulation within the wound bed. There is a large (67-100%) amount of necrotic tissue within the wound bed including Eschar. Assessment Active Problems ICD-10 Type 2 diabetes mellitus  with foot ulcer Non-pressure chronic ulcer of other part of left foot with  necrosis of bone Other chronic osteomyelitis, left ankle and foot Essential (primary) hypertension Atherosclerotic heart disease of native coronary artery without angina pectoris Chronic combined systolic (congestive) and diastolic (congestive) heart failure Procedures Wound #1 Pre-procedure diagnosis of Wound #1 is an Open Surgical Wound located on the Left Amputation Site - Digit . There was a Excisional Skin/Subcutaneous Tissue/Muscle Debridement with a total area of 16.32 sq cm performed by Tommie Sams., PA-C. With the following instrument (s): Curette to remove Viable and Non-Viable tissue/material. Material removed includes Tendon, Subcutaneous Tissue, Slough, and Biofilm. No specimens were taken. A time out was conducted at 09:15, prior to the start of the procedure. A Moderate amount of bleeding was controlled with Pressure. The procedure was tolerated well. Post Debridement Measurements: 6.8cm length x 2.4cm width x 3cm depth; 38.453cm^3 volume. Character of Wound/Ulcer Post Debridement is stable. Post procedure Diagnosis Wound #1: Same as Pre-Procedure Plan Follow-up Appointments: Return Appointment in 1 week. - provider visits at wound center Return Appointment in: - HBO- (daily Monday -Friday for HBO Treatments) Home Health: [facility] for wound care. May utilize formulary equivalent dressing for wound treatment orders unless otherwise specified. Home Health Nurse may visit [facility] to address patient s wound care needs. **Please direct any NON-WOUND related issues/requests for orders to patient's Primary Care Physician. **If current dressing causes regression in wound condition, may D/C ordered dressing product/s and apply Normal Saline Moist Dressing daily until next [place] or Other MD appointment. **Notify Wound Healing Center of regression in wound condition at [phone]. Other Home Health Orders/Instructions: - HOLD  WOUND VAC AT THIS TIME Bathing/ Shower/ Hygiene: Clean wound with Normal Saline or wound cleanser. May shower with wound dressing protected with water repellent cover or cast protector. No tub bath. 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 Follow Hyperbaric Oxygen Glycemia Protocol WOUND #1: - Amputation Site - Digit Wound Laterality: Left Cleanser: Normal Saline 1 x Per Day/30 Days Discharge Instructions: Wash your hands with soap and water. Remove old dressing, discard into plastic bag and place into trash. Cleanse the wound with Normal Saline prior to applying a clean dressing using gauze sponges, not tissues or cotton balls. Do not scrub or use excessive force. Pat dry using gauze sponges, not tissue or cotton balls. Cleanser: Vashe 5.8 (oz) 1 x Per Day/30 Days Discharge Instructions: Use vashe 5.8 (oz) as directed Cleanser: Wound Cleanser 1 x Per Day/30 Days Discharge Instructions: Wash your hands with soap and water. Remove old dressing, discard into plastic bag and place into trash. Cleanse the wound with Wound Cleanser prior to applying a clean dressing using gauze sponges, not tissues or cotton balls. Do not scrub or use excessive force. Pat dry using gauze sponges, not tissue or cotton balls. Primary Dressing: Gauze 1 x Per Day/30 Days Discharge Instructions: VASHE/DAKINS-As directed: dry, moistened with  saline or moistened  with Dakins Solution Secondary Dressing: ABD Pad 5x9 (in/in) 1 x Per Day/30 Days Discharge Instructions: Cover with ABD pad Secured With: 41M Medipore H Soft Cloth Surgical Tape, 2x2 (in/yd) 1 x Per Day/30 Days Secured With: Kerlix Roll Sterile or Non-Sterile 6-ply 4.5x4 (yd/yd) 1 x Per Day/30 Days Discharge Instructions: Apply Kerlix as directed WOUND #2: - Foot Wound Laterality: Left, Lateral Topical: Betadine Discharge Instructions: Apply betadine as directed. Primary Dressing: Gauze Discharge Instructions: As directed: dry, moistened with saline or moistened with Dakins Solution Secondary Dressing: ABD Pad 5x9 (in/in) Discharge Instructions: Cover with ABD pad 1. Would recommend currently that we go and continue with the wound care measures as before and the patient is in agreement the plan. This includes the use of the Dakin's moistened gauze dressing which I think is doing an excellent job. 2. Also can recommend that we have the patient continue with the ABD pad and roll gauze to secure in place. 3. I would also suggest the patient continue to go forward with HBO therapy which I think is definitely good and try to get this wound to heal he has a long ways to go for now if put the wound VAC off we may order that again in the future depending on how things progress. We will see patient back for reevaluation in 1 week here in the clinic. If anything worsens or changes patient will contact our office for additional recommendations. Electronic Signature(s) Signed: 11/28/2021 10:41:16 AM By: Worthy Keeler PA-C Entered By: Worthy Keeler on 11/28/2021 10:41:15 Monk, Chancelor [name]. ([id number]) -------------------------------------------------------------------------------- SuperBill Details Patient Name: Seman, Christiano [name]. Date of Service: 11/28/2021 Medical Record Number: [id number] Patient Account Number: [id number] Date of Birth/Sex: 1966/06/01 (56 y.o. 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 Facility Procedures CPT4 Code: 01749449 Description: 67591 - DEB MUSC/FASCIA 20 SQ CM/< Modifier: Quantity: 1 CPT4 Code: Description: ICD-10 Diagnosis Description L97.524 Non-pressure chronic ulcer of other part of left foot with necrosis of b Modifier: one Quantity: Physician Procedures CPT4 Code: 6384665 Description: 11043 - WC PHYS DEBR MUSCLE/FASCIA 20 SQ CM Modifier: Quantity: 1 CPT4 Code: Description: ICD-10 Diagnosis Description 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 Modifier: e Quantity: Electronic Signature(s) Signed: 11/28/2021 10:41:31 AM By: Worthy Keeler PA-C Entered By: Worthy Keeler on 11/28/2021 10:41:30

## 2021-11-28 NOTE — Progress Notes (Signed)
Saner, [place] ([id number]) Visit Report for 11/28/2021 Arrival Information Details Patient Name: [name], [name]. Date of Service: 11/28/2021 9:00 AM Medical Record Number: [id number] Patient Account Number: [id number] Date of Birth/Sex: 10/29/1965 (56 y.o. M) Treating RN: Donnamarie Poag Primary Care Anjali Manzella: [name] Other Clinician: Jacqulyn Bath Referring Melvenia Favela: [name] Treating Monesha Monreal/Extender: [name] in Treatment: 4 Visit Information History Since Last Visit Added or deleted any medications: No Patient Arrived: Ambulatory Any new allergies or adverse reactions: No Arrival Time: 08:15 Had a fall or experienced change in No Accompanied By: self activities of daily living that may affect Transfer Assistance: None risk of falls: Patient Identification Verified: Yes Signs or symptoms of abuse/neglect since last visito No Secondary Verification Process Completed: Yes Hospitalized since last visit: No Patient Requires Transmission-Based No Implantable device outside of the clinic excluding No Precautions: cellular tissue based products placed in the center Patient Has Alerts: Yes since last visit: Patient Alerts: Patient on Blood Pain Present Now: No Thinner DIALYSIS RIGHT ARM Aspirin 81/Brilinta Electronic Signature(s) Signed: 11/28/2021 12:32:09 PM By: Enedina Finner RCP, RRT, CHT Entered By: Stark Jock, Amado Nash on 11/28/2021 10:19:56 Vides, Darral L. ([id number]) -------------------------------------------------------------------------------- Encounter Discharge Information Details Patient Name: [name], [name] Date of Service: 11/28/2021 9:00 AM Medical Record Number: [id number] Patient Account Number: [id number] Date of Birth/Sex: November 03, 1965 (56 y.o. M) Treating RN: Donnamarie Poag Primary Care Oral Remache: [name] Other Clinician: Jacqulyn Bath Referring Mabry Santarelli: [name] Treating Vianey Caniglia/Extender: [name] in  Treatment: 4 Encounter Discharge Information Items Discharge Condition: Stable Ambulatory Status: Ambulatory Discharge Destination: Home Transportation: Private Auto Accompanied By: self Schedule Follow-up Appointment: Yes Clinical Summary of Care: Notes Patient has an HBO treatment scheduled on 11/29/21 at 10:30 am. Electronic Signature(s) Signed: 11/28/2021 12:32:09 PM By: Enedina Finner RCP, RRT, CHT Entered By: Stark Jock, Amado Nash on 11/28/2021 12:31:31 [name], [name] ([id number]) -------------------------------------------------------------------------------- Vitals Details Patient Name: [name], [name] Date of Service: 11/28/2021 9:00 AM Medical Record Number: [id number] Patient Account Number: [id number] Date of Birth/Sex: [birth year] (56 y.o. M) Treating RN: Donnamarie Poag Primary Care Remon Quinto: [name] Other Clinician: Jacqulyn Bath Referring Hannan Tetzlaff: [name] Treating Natasia Sanko/Extender: [name] in Treatment: 4 Vital Signs Time Taken: 08:19 Temperature (F): 97.8 Height (in): 70 Pulse (bpm): 72 Weight (lbs): 180.4 Respiratory Rate (breaths/min): 16 Body Mass Index (BMI): 25.9 Blood Pressure (mmHg): 138/68 Capillary Blood Glucose (mg/dl): 185 Reference Range: 80 - 120 mg / dl Electronic Signature(s) Signed: 11/28/2021 12:32:09 PM By: Enedina Finner RCP, RRT, CHT Entered By: Enedina Finner on 11/28/2021 10:20:39

## 2021-11-28 NOTE — Progress Notes (Addendum)
Altieri, [place] ([id number]) Visit Report for 11/28/2021 Arrival Information Details Patient Name: [name], [name]. Date of Service: 11/28/2021 11:30 AM Medical Record Number: [id number] Patient Account Number: [id number] Date of Birth/Sex: May 27, 1966 (56 y.o. M) Treating RN: Cornell Barman Primary Care Romero Letizia: [name] Other Clinician: Referring Jakalyn Kratky: [name] Treating Jagdeep Ancheta/Extender: [name] in Treatment: 4 Visit Information History Since Last Visit Added or deleted any medications: No Patient Arrived: Ambulatory Has Dressing in Place as Prescribed: Yes Arrival Time: 08:42 Pain Present Now: No Transfer Assistance: None Patient Identification Verified: Yes Secondary Verification Process Completed: Yes Patient Requires Transmission-Based No Precautions: Patient Has Alerts: Yes Patient Alerts: Patient on Blood Thinner DIALYSIS RIGHT ARM Aspirin 81/Brilinta Electronic Signature(s) Signed: 11/28/2021 8:42:42 AM By: Gretta Cool, BSN, RN, CWS, Kim RN, BSN Entered By: Gretta Cool, BSN, RN, CWS, Kim on 11/28/2021 08:42:42 [place], [place] ([id number]) -------------------------------------------------------------------------------- Encounter Discharge Information Details Patient Name: [name], [name] Date of Service: 11/28/2021 11:30 AM Medical Record Number: [id number] Patient Account Number: [id number] Date of Birth/Sex: [birth year] (56 y.o. M) Treating RN: Cornell Barman Primary Care Hassaan Crite: [name] Other Clinician: Referring Rafia Shedden: [name] Treating Rosela Supak/Extender: [name] in Treatment: 4 Encounter Discharge Information Items Post Procedure Vitals Discharge Condition: Stable Unable to obtain vitals Reason: Headed to HBO. Ambulatory Status: Ambulatory Discharge Destination: Home Transportation: Private Auto Accompanied By: self Schedule Follow-up Appointment: Yes Clinical Summary of Care: Electronic Signature(s) Signed: 11/28/2021 4:20:44 PM By:  Gretta Cool, BSN, RN, CWS, Kim RN, BSN Entered By: Gretta Cool, BSN, RN, CWS, Kim on 11/28/2021 10:38:08 [place], [place] ([id number]) -------------------------------------------------------------------------------- Lower Extremity Assessment Details Patient Name: [name], [name] Date of Service: 11/28/2021 11:30 AM Medical Record Number: [id number] Patient Account Number: [id number] Date of Birth/Sex: 09/15/1966 (56 y.o. M) Treating RN: Cornell Barman Primary Care Shraddha Lebron: [name] Other Clinician: Referring Derrik Mceachern: [name] Treating Cuauhtemoc Huegel/Extender: [name] in Treatment: 4 Vascular Assessment Pulses: Dorsalis Pedis Palpable: [Left:Yes] Electronic Signature(s) Signed: 11/28/2021 8:45:23 AM By: Gretta Cool, BSN, RN, CWS, Kim RN, BSN Entered By: Gretta Cool, BSN, RN, CWS, Kim on 11/28/2021 08:45:22 [place], [place] ([id number]) -------------------------------------------------------------------------------- Multi Wound Chart Details Patient Name: [name], [name] Date of Service: 11/28/2021 11:30 AM Medical Record Number: [id number] Patient Account Number: [id number] Date of Birth/Sex: July 25, 1966 (56 y.o. 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] Comorbid History: [1:Cataracts, Anemia, Sleep Apnea, Arrhythmia, Congestive Heart Failure, Coronary Artery Disease, Hypertension, Myocardial Infarction, Hypertension, Myocardial Infarction, Type II Diabetes, Neuropathy, Seizure Disorder] [2:Cataracts,  Anemia, Sleep Apnea, Arrhythmia, Congestive Heart Failure, Coronary Artery Disease, Type II  Diabetes, Neuropathy, Seizure Disorder] [N/A:N/A] Date Acquired: [1:09/09/2021] [2:10/28/2021] [N/A:N/A] Weeks of Treatment: [1:0] [2:0] [N/A:N/A] Wound Status: [1:Open] [2:Open] [N/A:N/A] Wound Recurrence: [1:No] [2:No] [N/A:N/A] Measurements L x W x D (cm) [1:6.8x2.4x2.9] [2:2x2x0.1] [N/A:N/A] Area (cm) : [1:12.818] [2:3.142] [N/A:N/A] Volume (cm) : [1:37.171] [2:0.314] [N/A:N/A] % Reduction in Area: [1:9.30%] [2:0.00%] [N/A:N/A] % Reduction in Volume: [1:9.30%] [2:0.00%] [N/A:N/A] Position 1 (o'clock): [1:9] Maximum Distance 1 (cm): [1:1] Position 2 (o'clock): [1:2] Maximum Distance 2 (cm): [1:1.2] Tunneling: [1:Yes] [2:No] [N/A:N/A] Classification: [1:Full Thickness With Exposed Support Structures] [2:Unable to visualize wound bed] [N/A:N/A] Exudate Amount: [1:Large] [2:None Present] [N/A:N/A] Exudate Type: [1:Serosanguineous] [2:N/A] [N/A:N/A] Exudate Color: [1:red, brown] [  2:N/A] [N/A:N/A] Granulation Amount: [1:Small (1-33%)] [2:None Present (0%)] [N/A:N/A] Granulation Quality: [1:Red] [2:N/A] [N/A:N/A] Necrotic Amount: [1:Medium (34-66%)] [2:Large (67-100%)] [N/A:N/A] Necrotic Tissue: [1:Adherent Slough] [2:Eschar] [N/A:N/A] Exposed Structures: [1:Fascia: Yes Fat Layer (Subcutaneous Tissue): Fat Layer (Subcutaneous Tissue): Yes Tendon: Yes Muscle: Yes Joint: No Bone: No N/A] [2:Fascia: No No Tendon: No Muscle: No Joint: No Bone: No None] [N/A:N/A N/A] Treatment Notes Electronic Signature(s) Signed: 11/28/2021 9:14:45 AM By: Gretta Cool, BSN, RN, CWS, Kim RN, BSN Entered By: Gretta Cool, BSN, RN, CWS, Kim on 11/28/2021 09:14:45 [place], [place] ([id number]) -------------------------------------------------------------------------------- Jacobus Details Patient Name: [name], [name]. Date of Service: 11/28/2021 11:30 AM Medical Record Number: [id number] Patient Account Number: [id number] Date of Birth/Sex: 07-24-1966 (56 y.o. M) Treating RN: Cornell Barman Primary Care Georgean Spainhower:  [name] Other Clinician: Referring Tyshay Adee: [name] Treating Steven Veazie/Extender: [name] in Treatment: 4 Active Inactive HBO Nursing Diagnoses: Anxiety related to feelings of confinement associated with the hyperbaric oxygen chamber Anxiety related to knowledge deficit of hyperbaric oxygen therapy and treatment procedures Discomfort related to temperature and humidity changes inside hyperbaric chamber Potential for barotraumas to ears, sinuses, teeth, and lungs or cerebral gas embolism related to changes in atmospheric pressure inside hyperbaric oxygen chamber Potential for oxygen toxicity seizures related to delivery of 100% oxygen at an increased atmospheric pressure Potential for pulmonary oxygen toxicity related to delivery of 100% oxygen at an increased atmospheric pressure Goals: Barotrauma will be prevented during HBO2 Date Initiated: 10/31/2021 Target Resolution Date: 12/06/2021 Goal Status: Active Patient and/or family will be able to state/discuss factors appropriate to the management of their disease process during treatment Date Initiated: 10/31/2021 Target Resolution Date: 11/15/2021 Goal Status: Active Patient will tolerate the hyperbaric oxygen therapy treatment Date Initiated: 10/31/2021 Target Resolution Date: 12/06/2021 Goal Status: Active Patient will tolerate the internal climate of the chamber Date Initiated: 10/31/2021 Target Resolution Date: 11/29/2021 Goal Status: Active Patient/caregiver will verbalize understanding of HBO goals, rationale, procedures and potential hazards Date Initiated: 10/31/2021 Target Resolution Date: 11/19/2021 Goal Status: Active Signs and symptoms of pulmonary oxygen toxicity will be recognized and promptly addressed Date Initiated: 10/31/2021 Target Resolution Date: 12/13/2021 Goal Status: Active Signs and symptoms of seizure will be recognized and promptly addressed ; seizing patients will suffer no harm Date  Initiated: 10/31/2021 Target Resolution Date: 12/20/2021 Goal Status: Active Interventions: Administer a five (5) minute air break for patient if signs and symptoms of seizure appear and notify the hyperbaric physician Administer a ten (10) minute air break for patient if signs and symptoms of seizure appear and notify the hyperbaric physician Administer the correct therapeutic gas delivery based on the patients needs and limitations, per physician order Assess and provide for patientos comfort related to the hyperbaric environment and equalization of middle ear Assess for signs and symptoms related to adverse events, including but not limited to confinement anxiety, pneumothorax, oxygen toxicity and baurotrauma Assess patient for any history of confinement anxiety Assess patient's knowledge and expectations regarding hyperbaric medicine and provide education related to the hyperbaric environment, goals of treatment and prevention of adverse events Notes: Necrotic Tissue Nursing Diagnoses: Impaired tissue integrity related to necrotic/devitalized tissue Knowledge deficit related to management of necrotic/devitalized tissue [name], [name] ([id number]) Goals: Necrotic/devitalized tissue will be minimized in the wound bed Date Initiated: 11/28/2021 Target Resolution Date: 11/28/2021 Goal Status: Active Patient/caregiver will verbalize understanding of reason and process for debridement of necrotic tissue Date Initiated: 11/28/2021 Target Resolution Date: 11/28/2021 Goal Status: Active Interventions: Assess patient pain level  pre-, during and post procedure and prior to discharge Provide education on necrotic tissue and debridement process Treatment Activities: Apply topical anesthetic as ordered : 11/28/2021 Excisional debridement : 11/28/2021 Notes: Wound/Skin Impairment Nursing Diagnoses: Impaired tissue integrity Knowledge deficit related to smoking impact on wound healing Knowledge deficit  related to ulceration/compromised skin integrity Goals: Ulcer/skin breakdown will have a volume reduction of 30% by week 4 Date Initiated: 11/28/2021 Target Resolution Date: 12/12/2021 Goal Status: Active Interventions: Assess patient/caregiver ability to obtain necessary supplies Assess patient/caregiver ability to perform ulcer/skin care regimen upon admission and as needed Assess ulceration(s) every visit Treatment Activities: Topical wound management initiated : 11/28/2021 Notes: Electronic Signature(s) Signed: 11/28/2021 4:20:44 PM By: Gretta Cool, BSN, RN, CWS, Kim RN, BSN Previous Signature: 11/28/2021 9:14:14 AM Version By: Gretta Cool, BSN, RN, CWS, Kim RN, BSN Previous Signature: 11/28/2021 8:46:32 AM Version By: Gretta Cool, BSN, RN, CWS, Kim RN, BSN Entered By: Gretta Cool, BSN, RN, CWS, Kim on 11/28/2021 10:36:37 [name], [name] ([id number]) -------------------------------------------------------------------------------- Pain Assessment Details Patient Name: [name], [name] Date of Service: 11/28/2021 11:30 AM Medical Record Number: [id number] Patient Account Number: [id number] Date of Birth/Sex: Feb 24, 1966 (56 y.o. M) Treating RN: Cornell Barman Primary Care Crescentia Boutwell: [name] Other Clinician: Referring Deb Loudin: [name] Treating Rayna Brenner/Extender: [name] in Treatment: 4 Active Problems Location of Pain Severity and Description of Pain Patient Has Paino No Site Locations Pain Management and Medication Current Pain Management: Notes Patient states he took a pain pill to prepare for possible debridement today. Electronic Signature(s) Signed: 11/28/2021 8:43:29 AM By: Gretta Cool, BSN, RN, CWS, Kim RN, BSN Entered By: Gretta Cool, BSN, RN, CWS, Kim on 11/28/2021 08:43:28 [name], [name] ([id number]) -------------------------------------------------------------------------------- Wound Assessment Details Patient Name: [name], [name] Date of Service: 11/28/2021 11:30 AM Medical Record Number:  [id number] Patient Account Number: [id number] Date of Birth/Sex: [birth year] (56 y.o. M) Treating RN: Cornell Barman Primary Care Larico Dimock: [name] Other Clinician: Referring Honestii Marton: [name] Treating Chadwin Fury/Extender: [name] in Treatment: 4 Wound Status Wound Number: 1 Primary Open Surgical Wound Etiology: Wound Location: Left Amputation Site - Digit Wound Open Wounding Event: Surgical Injury Status: Date Acquired: 09/09/2021 Comorbid Cataracts, Anemia, Sleep Apnea, Arrhythmia, Congestive Weeks Of Treatment: 0 History: Heart Failure, Coronary Artery Disease, Hypertension, Clustered Wound: No Myocardial Infarction, Type II Diabetes, Neuropathy, Seizure Disorder Photos Photo Uploaded By: Gretta Cool, BSN, RN, CWS, Kim on 11/28/2021 11:20:20 Wound Measurements Length: (cm) 6.8 Width: (cm) 2.4 Depth: (cm) 2.9 Area: (cm) 12.818 Volume: (cm) 37.171 % Reduction in Area: 9.3% % Reduction in Volume: 9.3% Tunneling: Yes Location 1 Position (o'clock): 9 Maximum Distance: (cm) 1 Location 2 Position (o'clock): 2 Maximum Distance: (cm) 1.2 Wound Description Classification: Full Thickness With Exposed Support Structures Exudate Amount: Large Exudate Type: Serosanguineous Exudate Color: red, brown Foul Odor After Cleansing: No Slough/Fibrino Yes Wound Bed Granulation Amount: Small (1-33%) Exposed Structure Granulation Quality: Red Fascia Exposed: Yes Necrotic Amount: Medium (34-66%) Fat Layer (Subcutaneous Tissue) Exposed: Yes Necrotic Quality: Adherent Slough Tendon Exposed: Yes Muscle Exposed: Yes Necrosis of Muscle: Yes Joint Exposed: No Bone Exposed: No Treatment Notes [name], [name] ([id number]) Wound #1 (Amputation Site - Digit) Wound Laterality: Left Cleanser Normal Saline Discharge Instruction: Wash your hands with soap and water. Remove old dressing, discard into plastic bag and place into trash. Cleanse the wound with Normal Saline prior to  applying a clean dressing using gauze sponges, not tissues or cotton balls. Do not scrub or use excessive force. Pat dry using gauze sponges, not tissue or cotton  balls. Vashe 5.8 (oz) Discharge Instruction: Use vashe 5.8 (oz) as directed Wound Cleanser Discharge Instruction: Wash your hands with soap and water. Remove old dressing, discard into plastic bag and place into trash. Cleanse the wound with Wound Cleanser prior to applying a clean dressing using gauze sponges, not tissues or cotton balls. Do not scrub or use excessive force. Pat dry using gauze sponges, not tissue or cotton balls. Peri-Wound Care Topical Primary Dressing Gauze Quantity: 1 Discharge Instruction: VASHE/DAKINS-As directed: dry, moistened with saline or moistened with Dakins Solution Secondary Dressing ABD Pad 5x9 (in/in) Quantity: 2 Discharge Instruction: Cover with ABD pad Secured With 51M Medipore H Soft Cloth Surgical Tape, 2x2 (in/yd) Kerlix Roll Sterile or Non-Sterile 6-ply 4.5x4 (yd/yd) Quantity: 1 Discharge Instruction: Apply Kerlix as directed Compression Wrap Compression Stockings Add-Ons Electronic Signature(s) Signed: 11/28/2021 8:44:47 AM By: Gretta Cool, BSN, RN, CWS, Kim RN, BSN Entered By: Gretta Cool, BSN, RN, CWS, Kim on 11/28/2021 08:44:47 [name], [name] ([id number]) -------------------------------------------------------------------------------- Wound Assessment Details Patient Name: [name], Amine L. Date of Service: 11/28/2021 11:30 AM Medical Record Number: [id number] Patient Account Number: [id number] Date of Birth/Sex: 07-Apr-1966 (56 y.o. M) Treating RN: Cornell Barman Primary Care Estelle Skibicki: [name] Other Clinician: Referring Ayianna Darnold: [name] Treating Lanayah Gartley/Extender: [name] in Treatment: 4 Wound Status Wound Number: 2 Primary Diabetic Wound/Ulcer of the Lower Extremity Etiology: Wound Location: Left, Lateral Foot Wound Open Wounding Event: Blister Status: Date  Acquired: 10/28/2021 Comorbid Cataracts, Anemia, Sleep Apnea, Arrhythmia, Congestive Weeks Of Treatment: 0 History: Heart Failure, Coronary Artery Disease, Hypertension, Clustered Wound: No Myocardial Infarction, Type II Diabetes, Neuropathy, Seizure Disorder Photos Photo Uploaded By: Gretta Cool, BSN, RN, CWS, Kim on 11/28/2021 11:20:20 Wound Measurements Length: (cm) 2 Width: (cm) 2 Depth: (cm) 0.1 Area: (cm) 3.142 Volume: (cm) 0.314 % Reduction in Area: 0% % Reduction in Volume: 0% Epithelialization: None Tunneling: No Undermining: No Wound Description Classification: Unable to visualize wound bed Exudate Amount: None Present Foul Odor After Cleansing: No Slough/Fibrino No Wound Bed Granulation Amount: None Present (0%) Exposed Structure Necrotic Amount: Large (67-100%) Fascia Exposed: No Necrotic Quality: Eschar Fat Layer (Subcutaneous Tissue) Exposed: No Tendon Exposed: No Muscle Exposed: No Joint Exposed: No Bone Exposed: No Treatment Notes Wound #2 (Foot) Wound Laterality: Left, Lateral Cleanser Peri-Wound Care Topical Betadine Rolland, Audra L. ([id number]) Quantity: 1 Discharge Instruction: Apply betadine as directed. Primary Dressing Gauze Discharge Instruction: As directed: dry, moistened with saline or moistened with Dakins Solution Secondary Dressing ABD Pad 5x9 (in/in) Quantity: 1 Discharge Instruction: Cover with ABD pad Secured With Compression Wrap Compression Stockings Add-Ons Electronic Signature(s) Signed: 11/28/2021 9:13:59 AM By: Gretta Cool, BSN, RN, CWS, Kim RN, BSN Entered By: Gretta Cool, BSN, RN, CWS, Kim on 11/28/2021 09:13:59 [place], [place] ([id number]) -------------------------------------------------------------------------------- Vitals Details Patient Name: Kniss, Jartavious L. Date of Service: 11/28/2021 11:30 AM Medical Record Number: [id number] Patient Account Number: [id number] Date of Birth/Sex: 10/08/1966 (56 y.o. M) Treating RN: Cornell Barman Primary Care Devaeh Amadi: [name] Other Clinician: Referring Adolphe Fortunato: [name] Treating Ronneisha Jett/Extender: [name] in Treatment: 4 Vital Signs Time Taken: 08:19 Temperature (F): 97.8 Height (in): 70 Pulse (bpm): 72 Weight (lbs): 180.4 Respiratory Rate (breaths/min): 16 Body Mass Index (BMI): 25.9 Blood Pressure (mmHg): 138/68 Capillary Blood Glucose (mg/dl): 185 Reference Range: 80 - 120 mg / dl Electronic Signature(s) Signed: 11/28/2021 12:32:09 PM By: Enedina Finner RCP, RRT, CHT Entered By: Enedina Finner on 11/28/2021 10:17:35

## 2021-11-29 ENCOUNTER — Other Ambulatory Visit: Payer: Self-pay

## 2021-11-29 ENCOUNTER — Encounter: Payer: Medicare Other | Admitting: Physician Assistant

## 2021-11-29 DIAGNOSIS — N186 End stage renal disease: Secondary | ICD-10-CM | POA: Diagnosis not present

## 2021-11-29 DIAGNOSIS — E1169 Type 2 diabetes mellitus with other specified complication: Secondary | ICD-10-CM | POA: Diagnosis not present

## 2021-11-29 DIAGNOSIS — D631 Anemia in chronic kidney disease: Secondary | ICD-10-CM | POA: Diagnosis not present

## 2021-11-29 DIAGNOSIS — Z992 Dependence on renal dialysis: Secondary | ICD-10-CM | POA: Diagnosis not present

## 2021-11-29 DIAGNOSIS — N2581 Secondary hyperparathyroidism of renal origin: Secondary | ICD-10-CM | POA: Diagnosis not present

## 2021-11-29 DIAGNOSIS — I110 Hypertensive heart disease with heart failure: Secondary | ICD-10-CM | POA: Diagnosis not present

## 2021-11-29 DIAGNOSIS — L97524 Non-pressure chronic ulcer of other part of left foot with necrosis of bone: Secondary | ICD-10-CM | POA: Diagnosis not present

## 2021-11-29 DIAGNOSIS — I5042 Chronic combined systolic (congestive) and diastolic (congestive) heart failure: Secondary | ICD-10-CM | POA: Diagnosis not present

## 2021-11-29 DIAGNOSIS — E11621 Type 2 diabetes mellitus with foot ulcer: Secondary | ICD-10-CM | POA: Diagnosis not present

## 2021-11-29 DIAGNOSIS — M86672 Other chronic osteomyelitis, left ankle and foot: Secondary | ICD-10-CM | POA: Diagnosis not present

## 2021-11-29 LAB — GLUCOSE, CAPILLARY
Glucose-Capillary: 176 mg/dL — ABNORMAL HIGH (ref 70–99)
Glucose-Capillary: 170 mg/dL — ABNORMAL HIGH (ref 70–99)

## 2021-11-29 NOTE — Progress Notes (Signed)
[name], [place] ([id number]) Visit Report for 11/29/2021 HBO Details Patient Name: [name], [name] Date of Service: 11/29/2021 10:30 AM Medical Record Number: [id number] Patient Account Number: [id number] Date of Birth/Sex: Sep 06, 1966 (56 y.o. 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 HBO Treatment Details Treatment Number: 17 Patient Type: Outpatient Chamber Type: Monoplace Chamber Serial #: [id number] Treatment Protocol: 2.0 ATA with 90 minutes oxygen, and no air breaks Treatment Details Compression Rate Down: 1.0 psi / minute De-Compression Rate Up: 1.5 psi / minute Compress Tx Pressure Air breaks and breathing periods Decompress Decompress Begins Reached (leave unused spaces blank) Begins Ends Chamber Pressure (ATA) 1 2 - - - - - - 2 1 Clock Time (24 hr) 10:28 10:45 - - - - - - 12:16 12:26 Treatment Length: 118 (minutes) Treatment Segments: 4 Vital Signs Capillary Blood Glucose Reference Range: 80 - 120 mg / dl HBO Diabetic Blood Glucose Intervention Range: <131 mg/dl or >249 mg/dl Time Vitals Blood Respiratory Capillary Blood Glucose Pulse Action Type: Pulse: Temperature: Taken: Pressure: Rate: Glucose (mg/dl): Meter #: Oximetry (%) Taken: Pre 10:08 132/74 78 16 98.5 176 1 none per protocol Post 12:29 130/72 78 16 98.7 170 1 none per protocol Treatment Response Treatment Toleration: Well Treatment Completion Treatment Completed without Adverse Event Status: Electronic Signature(s) Signed: 11/29/2021 12:42:09 PM By: Zackery Barefoot, RRT, CHT Signed: 11/29/2021 4:49:47 PM By: Worthy Keeler  PA-C Entered By: Stark Jock, Amado Nash on 11/29/2021 12:40:24 [name], [name] ([id number]) -------------------------------------------------------------------------------- HBO Safety Checklist Details Patient Name: [name], [name] Date of Service: 11/29/2021 10:30 AM Medical Record Number: [id number] Patient Account Number: [id number] Date of Birth/Sex: 1966/01/11 (56 y.o. M) Treating RN: Donnamarie Poag Primary Care Gracen Southwell: [name] Other Clinician: Jacqulyn Bath Referring Kassia Demarinis: [name] Treating Ethleen Lormand/Extender: [name] in Treatment: 4 HBO Safety Checklist Items Safety Checklist Consent Form Signed Patient voided / foley secured and emptied When did you last eato 10:00 am Last dose of injectable or oral agent n/a Ostomy pouch emptied and vented if applicable NA All implantable devices assessed, documented and approved NA Intravenous access site secured and place NA Valuables secured Linens and cotton and cotton/polyester blend (less than 51% polyester) Personal oil-based products / skin lotions / body lotions removed Wigs or hairpieces removed NA Smoking or tobacco materials removed NA Books / newspapers / magazines / loose paper removed NA Cologne, aftershave, perfume and deodorant removed Jewelry removed (may wrap wedding band) Make-up removed NA Hair care products removed Battery operated devices (external) removed NA Heating patches and chemical warmers removed NA Titanium eyewear removed NA Nail polish cured greater than 10 hours NA Casting material cured greater than 10 hours NA Hearing aids removed NA Loose dentures or partials removed NA Prosthetics have been removed NA Patient demonstrates correct use of air break device (if applicable) Patient concerns have been addressed Patient grounding bracelet on and cord attached to chamber Specifics for Inpatients (complete in addition to above) Medication sheet sent with  patient Intravenous medications needed or due during therapy sent with patient Drainage tubes (e.g. nasogastric tube or chest tube secured and vented) Endotracheal or Tracheotomy tube secured Cuff deflated of air and inflated with saline Airway suctioned Electronic Signature(s) Signed: 11/29/2021  12:42:09 PM By: Enedina Finner RCP, RRT, CHT Entered By: Enedina Finner on 11/29/2021 10:15:53

## 2021-11-29 NOTE — Progress Notes (Signed)
Han[place]alt, [place]well ([id number]) Visit Report for 11/28/2021 HBO Details Patient Name: [name], [name] Date of Service: 11/28/2021 9:00 AM Medical Record Number: [id number] Patient Account Number: [id number] Date of Birth/Sex: 12-30-1965 (56 y.o. M) Treating RN: Donnamarie Poag Primary Care Chanette Demo: [name] Other Clinician: Jacqulyn Bath Referring Gizel Riedlinger: [name] Treating Ferdie Bakken/Extender: [name] in Treatment: 4 HBO Treatment Course Details Treatment Course Number: 1 Ordering Vondra Aldredge: Jeri Cos Total Treatments Ordered: 40 HBO Treatment Start Date: 11/04/2021 HBO Indication: Chronic Refractory Osteomyelitis to Left Foot HBO Treatment Details Treatment Number: 16 Patient Type: Outpatient Chamber Type: Monoplace Chamber Serial #: [id number] Treatment Protocol: 2.0 ATA with 90 minutes oxygen, and no air breaks Treatment Details Compression Rate Down: 1.0 psi / minute De-Compression Rate Up: 1.5 psi / minute Compress Tx Pressure Air breaks and breathing periods Decompress Decompress Begins Reached (leave unused spaces blank) Begins Ends Chamber Pressure (ATA) 1 2 - - - - - - 2 1 Clock Time (24 hr) 10:08 10:25 - - - - - - 11:55 12:05 Treatment Length: 117 (minutes) Treatment Segments: 4 Vital Signs Capillary Blood Glucose Reference Range: 80 - 120 mg / dl HBO Diabetic Blood Glucose Intervention Range: <131 mg/dl or >249 mg/dl Time Vitals Blood Respiratory Capillary Blood Glucose Pulse Action Type: Pulse: Temperature: Taken: Pressure: Rate: Glucose (mg/dl): Meter #: Oximetry (%) Taken: Pre 08:19 138/68 72 16 97.8 185 1 none per protocol Post 12:19 132/74 78 16 98 109 1 none per protocol Treatment Response Treatment Toleration: Well Adverse Events: 1:Barotrauma - Ear Treatment Completion Treatment Completed with Adverse Event Status: Treatment Notes Began treatment at 0938 at a rate of 1.5 psi/min. Patient had no complaints about stuffiness, etc. At an  ATA of 1.45 patient complained of severe ear pain in his left ear that he could not clear. Pressure set was dialed back to 1.3 and I started decompression. PA was notified and checked patient's ears once he was out of the chamber. PA gave the patient the choice of using nasal decongestant spray and going back in the chamber at a lower rate (which he chose to do) or using the spray twice today and in the morning before going back in tomorrow. Patient had no more problems and was able to reach 2.0 ATA with no more problems. Electronic Signature(s) Signed: 11/28/2021 12:32:09 PM By: Zackery Barefoot, RRT, CHT Signed: 11/28/2021 5:14:54 PM By: Worthy Keeler PA-C Entered By: Stark Jock, Amado Nash on 11/28/2021 12:30:44 [name], [name] ([id number]) -------------------------------------------------------------------------------- HBO Safety Checklist Details Patient Name: [name], [name] Date of Service: 11/28/2021 9:00 AM Medical Record Number: [id number] Patient Account Number: [id number] Date of Birth/Sex: 1966/08/20 (56 y.o. M) Treating RN: Donnamarie Poag Primary Care Gretta Samons: [name] Other Clinician: Jacqulyn Bath Referring Aaro Meyers: [name] Treating Brandolyn Shortridge/Extender: [name] in Treatment: 4 HBO Safety Checklist Items Safety Checklist Consent Form Signed Patient voided / foley secured and emptied When did you last eato 07:00 am Last dose of injectable or oral agent n/a Ostomy pouch emptied and vented if applicable NA All implantable devices assessed, documented and approved NA Intravenous access site secured and place NA Valuables secured Linens and cotton and cotton/polyester blend (less than 51% polyester) Personal oil-based products / skin lotions / body lotions removed Wigs or hairpieces removed NA Smoking or tobacco materials removed NA Books / newspapers / magazines / loose paper removed NA Cologne, aftershave, perfume and deodorant  removed Jewelry removed (may wrap wedding band) Make-up removed NA Hair care products  removed Battery operated devices (external) removed NA Heating patches and chemical warmers removed NA Titanium eyewear removed NA Nail polish cured greater than 10 hours NA Casting material cured greater than 10 hours NA Hearing aids removed NA Loose dentures or partials removed NA Prosthetics have been removed NA Patient demonstrates correct use of air break device (if applicable) Patient concerns have been addressed Patient grounding bracelet on and cord attached to chamber Specifics for Inpatients (complete in addition to above) Medication sheet sent with patient Intravenous medications needed or due during therapy sent with patient Drainage tubes (e.g. nasogastric tube or chest tube secured and vented) Endotracheal or Tracheotomy tube secured Cuff deflated of air and inflated with saline Airway suctioned Electronic Signature(s) Signed: 11/28/2021 12:32:09 PM By: Enedina Finner RCP, RRT, CHT Entered By: Stark Jock, Amado Nash on 11/28/2021 10:22:02

## 2021-11-29 NOTE — Progress Notes (Signed)
[name], Lasana ([id number]) Visit Report for 11/29/2021 Arrival Information Details Patient Name: TRINI, CHRISTIANSEN. Date of Service: 11/29/2021 10:30 AM Medical Record Number: [id number] Patient Account Number: [id number] Date of Birth/Sex: Mar 13, 1966 (56 y.o. M) Treating RN: Donnamarie Poag Primary Care Thomasena Vandenheuvel: [name] Other Clinician: Jacqulyn Bath Referring Rumor Sun: [name] Treating Amayrani Bennick/Extender: [name] in Treatment: 4 Visit Information History Since Last Visit Added or deleted any medications: No Patient Arrived: Ambulatory Any new allergies or adverse reactions: No Arrival Time: 10:05 Had a fall or experienced change in No Accompanied By: self activities of daily living that may affect Transfer Assistance: None risk of falls: Patient Identification Verified: Yes Signs or symptoms of abuse/neglect since last visito No Secondary Verification Process Completed: Yes Hospitalized since last visit: No Patient Requires Transmission-Based No Implantable device outside of the clinic excluding No Precautions: cellular tissue based products placed in the center Patient Has Alerts: Yes since last visit: Patient Alerts: Patient on Blood Has Dressing in Place as Prescribed: Yes Thinner Has Footwear/Offloading in Place as Prescribed: Yes DIALYSIS RIGHT ARM Left: Surgical Shoe with Pressure Aspirin 81/Brilinta Relief Insole Pain Present Now: No Electronic Signature(s) Signed: 11/29/2021 12:42:09 PM By: Enedina Finner RCP, RRT, CHT Entered By: Stark Jock, Amado Nash on 11/29/2021 10:14:00 Heringer, Ottis L. ([id number]) -------------------------------------------------------------------------------- Encounter Discharge Information Details Patient Name: Bagheri, Andrell L. Date of Service: 11/29/2021 10:30 AM Medical Record Number: [id number] Patient Account Number: [id number] Date of Birth/Sex: [birth year] (56 y.o. M) Treating RN: Donnamarie Poag Primary  Care Gwendalynn Eckstrom: [name] Other Clinician: Jacqulyn Bath Referring Wendie Diskin: [name] Treating Trixie Maclaren/Extender: [name] in Treatment: 4 Encounter Discharge Information Items Discharge Condition: Stable Ambulatory Status: Ambulatory Discharge Destination: Home Transportation: Private Auto Accompanied By: self Schedule Follow-up Appointment: Yes Clinical Summary of Care: Notes Patient has an HBO treatment scheduled on 12/02/21 at 10:30 am. Electronic Signature(s) Signed: 11/29/2021 12:42:09 PM By: Enedina Finner RCP, RRT, CHT Entered By: Stark Jock, Amado Nash on 11/29/2021 12:41:45 Kant, Fares L. ([id number]) -------------------------------------------------------------------------------- Vitals Details Patient Name: Storer, Nasier L. Date of Service: 11/29/2021 10:30 AM Medical Record Number: [id number] Patient Account Number: [id number] Date of Birth/Sex: March 23, 1966 (56 y.o. M) Treating RN: Donnamarie Poag Primary Care Santiana Glidden: [name] Other Clinician: Jacqulyn Bath Referring Koral Thaden: [name] Treating Hilda Rynders/Extender: [name] in Treatment: 4 Vital Signs Time Taken: 10:08 Temperature (F): 98.5 Height (in): 70 Pulse (bpm): 78 Weight (lbs): 180.4 Respiratory Rate (breaths/min): 16 Body Mass Index (BMI): 25.9 Blood Pressure (mmHg): 132/74 Capillary Blood Glucose (mg/dl): 176 Reference Range: 80 - 120 mg / dl Electronic Signature(s) Signed: 11/29/2021 12:42:09 PM By: Enedina Finner RCP, RRT, CHT Entered By: Enedina Finner on 11/29/2021 10:14:48

## 2021-12-02 ENCOUNTER — Other Ambulatory Visit: Payer: Self-pay

## 2021-12-02 ENCOUNTER — Encounter: Payer: Medicare Other | Admitting: Physician Assistant

## 2021-12-02 DIAGNOSIS — E11621 Type 2 diabetes mellitus with foot ulcer: Secondary | ICD-10-CM | POA: Diagnosis not present

## 2021-12-02 DIAGNOSIS — D631 Anemia in chronic kidney disease: Secondary | ICD-10-CM | POA: Diagnosis not present

## 2021-12-02 DIAGNOSIS — I110 Hypertensive heart disease with heart failure: Secondary | ICD-10-CM | POA: Diagnosis not present

## 2021-12-02 DIAGNOSIS — Z992 Dependence on renal dialysis: Secondary | ICD-10-CM | POA: Diagnosis not present

## 2021-12-02 DIAGNOSIS — I5042 Chronic combined systolic (congestive) and diastolic (congestive) heart failure: Secondary | ICD-10-CM | POA: Diagnosis not present

## 2021-12-02 DIAGNOSIS — M86672 Other chronic osteomyelitis, left ankle and foot: Secondary | ICD-10-CM | POA: Diagnosis not present

## 2021-12-02 DIAGNOSIS — N186 End stage renal disease: Secondary | ICD-10-CM | POA: Diagnosis not present

## 2021-12-02 DIAGNOSIS — Z5181 Encounter for therapeutic drug level monitoring: Secondary | ICD-10-CM | POA: Diagnosis not present

## 2021-12-02 DIAGNOSIS — E1169 Type 2 diabetes mellitus with other specified complication: Secondary | ICD-10-CM | POA: Diagnosis not present

## 2021-12-02 DIAGNOSIS — N2581 Secondary hyperparathyroidism of renal origin: Secondary | ICD-10-CM | POA: Diagnosis not present

## 2021-12-02 DIAGNOSIS — L97524 Non-pressure chronic ulcer of other part of left foot with necrosis of bone: Secondary | ICD-10-CM | POA: Diagnosis not present

## 2021-12-02 NOTE — Progress Notes (Signed)
Umanzor, [place] ([id number]) Visit Report for 12/02/2021 HBO Details Patient Name: [name], [name] Date of Service: 12/02/2021 10:30 AM Medical Record Number: [id number] Patient Account Number: [id number] Date of Birth/Sex: 1966-05-29 (56 y.o. M) Treating RN: Donnamarie Poag Primary Care Riggins Cisek: [name] Other Clinician: Jacqulyn Bath Referring Agustine Rossitto: [name] Treating Mabel Unrein/Extender: [name] in Treatment: 4 HBO Treatment Course Details Treatment Course Number: 1 Ordering Ceci Taliaferro: Jeri Cos Total Treatments Ordered: 40 HBO Treatment Start Date: 11/04/2021 HBO Indication: Chronic Refractory Osteomyelitis to Left Foot HBO Treatment Details Treatment Number: 18 Patient Type: Outpatient Chamber Type: Monoplace Chamber Serial #: [id number] Treatment Protocol: 2.0 ATA with 90 minutes oxygen, and no air breaks Treatment Details Compression Rate Down: 1.0 psi / minute De-Compression Rate Up: 1.5 psi / minute Compress Tx Pressure Air breaks and breathing periods Decompress Decompress Begins Reached (leave unused spaces blank) Begins Ends Chamber Pressure (ATA) 1 2 - - - - - - 2 1 Clock Time (24 hr) 10:39 10:56 - - - - - - 12:27 12:37 Treatment Length: 118 (minutes) Treatment Segments: 4 Vital Signs Capillary Blood Glucose Reference Range: 80 - 120 mg / dl HBO Diabetic Blood Glucose Intervention Range: <131 mg/dl or >249 mg/dl Time Vitals Blood Respiratory Capillary Blood Glucose Pulse Action Type: Pulse: Temperature: Taken: Pressure: Rate: Glucose (mg/dl): Meter #: Oximetry (%) Taken: Pre 10:33 128/68 84 16 98.4 151 1 none per protocol Post 12:41 124/68 72 16 98.5 150 1 none per protocol Treatment Response Treatment Toleration: Well Treatment Completion Treatment Completed without Adverse Event Status: Electronic Signature(s) Signed: 12/02/2021 1:09:12 PM By: Enedina Finner RCP, RRT, CHT Signed: 12/02/2021 3:27:38 PM By: Worthy Keeler  PA-C Previous Signature: 12/02/2021 10:57:22 AM Version By: Worthy Keeler PA-C Entered By: Stark Jock, Amado Nash on 12/02/2021 13:06:54 [name], [name] ([id number]) -------------------------------------------------------------------------------- HBO Safety Checklist Details Patient Name: [name], [name] Date of Service: 12/02/2021 10:30 AM Medical Record Number: [id number] Patient Account Number: [id number] Date of Birth/Sex: 03/25/1966 (56 y.o. M) Treating RN: Donnamarie Poag Primary Care Nou Chard: [name] Other Clinician: Jacqulyn Bath Referring Kelijah Towry: [name] Treating Husna Krone/Extender: [name] in Treatment: 4 HBO Safety Checklist Items Safety Checklist Consent Form Signed Patient voided / foley secured and emptied When did you last eato 10:00 Last dose of injectable or oral agent n/a Ostomy pouch emptied and vented if applicable NA All implantable devices assessed, documented and approved NA Intravenous access site secured and place NA Valuables secured Linens and cotton and cotton/polyester blend (less than 51% polyester) Personal oil-based products / skin lotions / body lotions removed Wigs or hairpieces removed NA Smoking or tobacco materials removed NA Books / newspapers / magazines / loose paper removed NA Cologne, aftershave, perfume and deodorant removed Jewelry removed (may wrap wedding band) Make-up removed NA Hair care products removed Battery operated devices (external) removed NA Heating patches and chemical warmers removed NA Titanium eyewear removed NA Nail polish cured greater than 10 hours NA Casting material cured greater than 10 hours NA Hearing aids removed NA Loose dentures or partials removed NA Prosthetics have been removed NA Patient demonstrates correct use of air break device (if applicable) Patient concerns have been addressed Patient grounding bracelet on and cord attached to chamber Specifics for  Inpatients (complete in addition to above) Medication sheet sent with patient Intravenous medications needed or due during therapy sent with patient Drainage tubes (e.g. nasogastric tube or chest tube secured and vented) Endotracheal or Tracheotomy tube secured Cuff deflated of air  and inflated with saline Airway suctioned Electronic Signature(s) Signed: 12/02/2021 1:09:12 PM By: Enedina Finner RCP, RRT, CHT Entered By: Enedina Finner on 12/02/2021 10:45:29

## 2021-12-02 NOTE — Progress Notes (Signed)
[place], [place] ([id number]) Visit Report for 12/02/2021 Arrival Information Details Patient Name: [name], [name]. Date of Service: 12/02/2021 10:30 AM Medical Record Number: [id number] Patient Account Number: [id number] Date of Birth/Sex: 12/14/1965 (56 y.o. M) Treating RN: Donnamarie Poag Primary Care Khyren Hing: [name] Other Clinician: Jacqulyn Bath Referring Marx Doig: [name] Treating Colden Samaras/Extender: [name] in Treatment: 4 Visit Information History Since Last Visit Added or deleted any medications: No Patient Arrived: Ambulatory Any new allergies or adverse reactions: No Arrival Time: 10:18 Had a fall or experienced change in No Accompanied By: self activities of daily living that may affect Transfer Assistance: None risk of falls: Patient Identification Verified: Yes Signs or symptoms of abuse/neglect since last visito No Secondary Verification Process Completed: Yes Hospitalized since last visit: No Patient Requires Transmission-Based No Implantable device outside of the clinic excluding No Precautions: cellular tissue based products placed in the center Patient Has Alerts: Yes since last visit: Patient Alerts: Patient on Blood Pain Present Now: No Thinner DIALYSIS RIGHT ARM Aspirin 81/Brilinta Electronic Signature(s) Signed: 12/02/2021 1:09:12 PM By: Enedina Finner RCP, RRT, CHT Entered By: Stark Jock, Amado Nash on 12/02/2021 10:43:31 [name], [name] ([id number]) -------------------------------------------------------------------------------- Encounter Discharge Information Details Patient Name: [name], [name] Date of Service: 12/02/2021 10:30 AM Medical Record Number: [id number] Patient Account Number: [id number] Date of Birth/Sex: Aug 29, 1966 (56 y.o. M) Treating RN: Donnamarie Poag Primary Care Broc Caspers: [name] Other Clinician: Jacqulyn Bath Referring Montanna Mcbain: [name] Treating Rodrecus Belsky/Extender: [name]  in Treatment: 4 Encounter Discharge Information Items Discharge Condition: Stable Ambulatory Status: Ambulatory Discharge Destination: Home Transportation: Private Auto Accompanied By: self Schedule Follow-up Appointment: Yes Clinical Summary of Care: Notes Patient has an HBO treatment scheduled on 12/03/21 at 09:00 am. Electronic Signature(s) Signed: 12/02/2021 1:09:12 PM By: Enedina Finner RCP, RRT, CHT Entered By: Stark Jock, Amado Nash on 12/02/2021 13:08:49 [name], [name] ([id number]) -------------------------------------------------------------------------------- Vitals Details Patient Name: [name], [name] Date of Service: 12/02/2021 10:30 AM Medical Record Number: [id number] Patient Account Number: [id number] Date of Birth/Sex: [birth year] (56 y.o. M) Treating RN: Donnamarie Poag Primary Care Westyn Keatley: [name] Other Clinician: Jacqulyn Bath Referring Sire Poet: [name] Treating Theone Bowell/Extender: [name] in Treatment: 4 Vital Signs Time Taken: 10:33 Temperature (F): 98.4 Height (in): 70 Pulse (bpm): 84 Weight (lbs): 180.4 Respiratory Rate (breaths/min): 16 Body Mass Index (BMI): 25.9 Blood Pressure (mmHg): 128/68 Capillary Blood Glucose (mg/dl): 151 Reference Range: 80 - 120 mg / dl Electronic Signature(s) Signed: 12/02/2021 1:09:12 PM By: Enedina Finner RCP, RRT, CHT Entered By: Enedina Finner on 12/02/2021 10:44:25

## 2021-12-03 ENCOUNTER — Encounter: Payer: Medicare Other | Admitting: Physician Assistant

## 2021-12-04 ENCOUNTER — Encounter (HOSPITAL_BASED_OUTPATIENT_CLINIC_OR_DEPARTMENT_OTHER): Payer: Medicare Other | Admitting: Internal Medicine

## 2021-12-04 ENCOUNTER — Other Ambulatory Visit: Payer: Self-pay

## 2021-12-04 DIAGNOSIS — E11621 Type 2 diabetes mellitus with foot ulcer: Secondary | ICD-10-CM

## 2021-12-04 DIAGNOSIS — D631 Anemia in chronic kidney disease: Secondary | ICD-10-CM | POA: Diagnosis not present

## 2021-12-04 DIAGNOSIS — I120 Hypertensive chronic kidney disease with stage 5 chronic kidney disease or end stage renal disease: Secondary | ICD-10-CM | POA: Diagnosis not present

## 2021-12-04 DIAGNOSIS — M86672 Other chronic osteomyelitis, left ankle and foot: Secondary | ICD-10-CM

## 2021-12-04 DIAGNOSIS — Z992 Dependence on renal dialysis: Secondary | ICD-10-CM | POA: Diagnosis not present

## 2021-12-04 DIAGNOSIS — L97524 Non-pressure chronic ulcer of other part of left foot with necrosis of bone: Secondary | ICD-10-CM

## 2021-12-04 DIAGNOSIS — E039 Hypothyroidism, unspecified: Secondary | ICD-10-CM | POA: Diagnosis not present

## 2021-12-04 DIAGNOSIS — Z955 Presence of coronary angioplasty implant and graft: Secondary | ICD-10-CM | POA: Diagnosis not present

## 2021-12-04 DIAGNOSIS — L03116 Cellulitis of left lower limb: Secondary | ICD-10-CM | POA: Diagnosis not present

## 2021-12-04 DIAGNOSIS — Z89422 Acquired absence of other left toe(s): Secondary | ICD-10-CM | POA: Diagnosis not present

## 2021-12-04 DIAGNOSIS — Z79899 Other long term (current) drug therapy: Secondary | ICD-10-CM | POA: Diagnosis not present

## 2021-12-04 DIAGNOSIS — E1169 Type 2 diabetes mellitus with other specified complication: Secondary | ICD-10-CM | POA: Diagnosis not present

## 2021-12-04 DIAGNOSIS — Z9181 History of falling: Secondary | ICD-10-CM | POA: Diagnosis not present

## 2021-12-04 DIAGNOSIS — I2510 Atherosclerotic heart disease of native coronary artery without angina pectoris: Secondary | ICD-10-CM | POA: Diagnosis not present

## 2021-12-04 DIAGNOSIS — E1151 Type 2 diabetes mellitus with diabetic peripheral angiopathy without gangrene: Secondary | ICD-10-CM | POA: Diagnosis not present

## 2021-12-04 DIAGNOSIS — Z4781 Encounter for orthopedic aftercare following surgical amputation: Secondary | ICD-10-CM | POA: Diagnosis not present

## 2021-12-04 DIAGNOSIS — Z792 Long term (current) use of antibiotics: Secondary | ICD-10-CM | POA: Diagnosis not present

## 2021-12-04 DIAGNOSIS — B957 Other staphylococcus as the cause of diseases classified elsewhere: Secondary | ICD-10-CM | POA: Diagnosis not present

## 2021-12-04 DIAGNOSIS — I110 Hypertensive heart disease with heart failure: Secondary | ICD-10-CM | POA: Diagnosis not present

## 2021-12-04 DIAGNOSIS — Z7902 Long term (current) use of antithrombotics/antiplatelets: Secondary | ICD-10-CM | POA: Diagnosis not present

## 2021-12-04 DIAGNOSIS — Z89431 Acquired absence of right foot: Secondary | ICD-10-CM | POA: Diagnosis not present

## 2021-12-04 DIAGNOSIS — B952 Enterococcus as the cause of diseases classified elsewhere: Secondary | ICD-10-CM | POA: Diagnosis not present

## 2021-12-04 DIAGNOSIS — Z7982 Long term (current) use of aspirin: Secondary | ICD-10-CM | POA: Diagnosis not present

## 2021-12-04 DIAGNOSIS — N2581 Secondary hyperparathyroidism of renal origin: Secondary | ICD-10-CM | POA: Diagnosis not present

## 2021-12-04 DIAGNOSIS — I5042 Chronic combined systolic (congestive) and diastolic (congestive) heart failure: Secondary | ICD-10-CM | POA: Diagnosis not present

## 2021-12-04 DIAGNOSIS — Z89412 Acquired absence of left great toe: Secondary | ICD-10-CM | POA: Diagnosis not present

## 2021-12-04 DIAGNOSIS — E1142 Type 2 diabetes mellitus with diabetic polyneuropathy: Secondary | ICD-10-CM | POA: Diagnosis not present

## 2021-12-04 DIAGNOSIS — N186 End stage renal disease: Secondary | ICD-10-CM | POA: Diagnosis not present

## 2021-12-04 DIAGNOSIS — E1122 Type 2 diabetes mellitus with diabetic chronic kidney disease: Secondary | ICD-10-CM | POA: Diagnosis not present

## 2021-12-04 DIAGNOSIS — E782 Mixed hyperlipidemia: Secondary | ICD-10-CM | POA: Diagnosis not present

## 2021-12-04 DIAGNOSIS — M86172 Other acute osteomyelitis, left ankle and foot: Secondary | ICD-10-CM | POA: Diagnosis not present

## 2021-12-04 LAB — GLUCOSE, CAPILLARY
Glucose-Capillary: 119 mg/dL — ABNORMAL HIGH (ref 70–99)
Glucose-Capillary: 151 mg/dL — ABNORMAL HIGH (ref 70–99)
Glucose-Capillary: 150 mg/dL — ABNORMAL HIGH (ref 70–99)
Glucose-Capillary: 139 mg/dL — ABNORMAL HIGH (ref 70–99)
Glucose-Capillary: 123 mg/dL — ABNORMAL HIGH (ref 70–99)

## 2021-12-04 NOTE — Progress Notes (Signed)
Zandi, [place] ([id number]) Visit Report for 12/04/2021 HBO Details Patient Name: [name], [name] Date of Service: 12/04/2021 10:30 AM Medical Record Number: [id number] Patient Account Number: [id number] Date of Birth/Sex: [birth year] (56 y.o. M) Treating RN: Donnamarie Poag Primary Care Hendrick Pavich: [name] Other Clinician: Jacqulyn Bath Referring Lajean Boese: [name] Treating Sherlin Sonier/Extender: Yaakov Guthrie in Treatment: 4 HBO Treatment Course Details Treatment Course Number: 1 Ordering Titania Gault: Jeri Cos Total Treatments Ordered: 40 HBO Treatment Start Date: 11/04/2021 HBO Indication: Chronic Refractory Osteomyelitis to Left Foot HBO Treatment Details Treatment Number: 19 Patient Type: Outpatient Chamber Type: Monoplace Chamber Serial #: [id number] Treatment Protocol: 2.0 ATA with 90 minutes oxygen, and no air breaks Treatment Details Compression Rate Down: 1.0 psi / minute De-Compression Rate Up: 1.5 psi / minute Compress Tx Pressure Air breaks and breathing periods Decompress Decompress Begins Reached (leave unused spaces blank) Begins Ends Chamber Pressure (ATA) 1 2 - - - - - - 2 1 Clock Time (24 hr) 10:37 10:54 - - - - - - 12:25 12:35 Treatment Length: 118 (minutes) Treatment Segments: 4 Vital Signs Capillary Blood Glucose Reference Range: 80 - 120 mg / dl HBO Diabetic Blood Glucose Intervention Range: <131 mg/dl or >249 mg/dl Time Vitals Blood Respiratory Capillary Blood Glucose Pulse Action Type: Pulse: Temperature: Taken: Pressure: Rate: Glucose (mg/dl): Meter #: Oximetry (%) Taken: Pre 10:20 122/66 72 16 98.8 139 1 none per protocol Post 12:38 130/72 66 16 98.6 123 1 none per protocol Treatment Response Treatment Toleration: Well Treatment Completion Treatment Completed without Adverse Event Status: HBO Attestation I certify that I supervised this HBO treatment in accordance with Medicare guidelines. A trained emergency response team is  readily Yes available per hospital policies and procedures. Continue HBOT as ordered. Yes Electronic Signature(s) Signed: 12/04/2021 2:35:27 PM By: Kalman Shan DO Previous Signature: 12/04/2021 12:48:14 PM Version By: Enedina Finner RCP, RRT, CHT Entered By: Kalman Shan on 12/04/2021 14:35:08 Kroenke, Elonzo L. ([id number]) -------------------------------------------------------------------------------- HBO Safety Checklist Details Patient Name: [name], [name] Date of Service: 12/04/2021 10:30 AM Medical Record Number: [id number] Patient Account Number: [id number] Date of Birth/Sex: [birth year] (56 y.o. M) Treating RN: Donnamarie Poag Primary Care Aryiana Klinkner: [name] Other Clinician: Jacqulyn Bath Referring Topher Buenaventura: [name] Treating Kenwood Rosiak/Extender: Yaakov Guthrie in Treatment: 4 HBO Safety Checklist Items Safety Checklist Consent Form Signed Patient voided / foley secured and emptied When did you last eato 10:00 am Last dose of injectable or oral agent n/a Ostomy pouch emptied and vented if applicable NA All implantable devices assessed, documented and approved NA Intravenous access site secured and place NA Valuables secured Linens and cotton and cotton/polyester blend (less than 51% polyester) Personal oil-based products / skin lotions / body lotions removed Wigs or hairpieces removed NA Smoking or tobacco materials removed NA Books / newspapers / magazines / loose paper removed NA Cologne, aftershave, perfume and deodorant removed Jewelry removed (may wrap wedding band) Make-up removed NA Hair care products removed Battery operated devices (external) removed NA Heating patches and chemical warmers removed NA Titanium eyewear removed NA Nail polish cured greater than 10 hours NA Casting material cured greater than 10 hours NA Hearing aids removed NA Loose dentures or partials removed NA Prosthetics have been  removed NA Patient demonstrates correct use of air break device (if applicable) Patient concerns have been addressed Patient grounding bracelet on and cord attached to chamber Specifics for Inpatients (complete in addition to above) Medication sheet sent with patient Intravenous medications needed or due during  therapy sent with patient Drainage tubes (e.g. nasogastric tube or chest tube secured and vented) Endotracheal or Tracheotomy tube secured Cuff deflated of air and inflated with saline Airway suctioned Electronic Signature(s) Signed: 12/04/2021 12:48:14 PM By: Enedina Finner RCP, RRT, CHT Entered By: Stark Jock, Amado Nash on 12/04/2021 10:29:07

## 2021-12-04 NOTE — Progress Notes (Signed)
Dimaio, Melba ([id number]) Visit Report for 12/04/2021 Arrival Information Details Patient Name: ALSTON, BERRIE. Date of Service: 12/04/2021 10:30 AM Medical Record Number: [id number] Patient Account Number: [id number] Date of Birth/Sex: 16-Nov-1965 (56 y.o. M) Treating RN: Donnamarie Poag Primary Care Annaliz Aven: [name] Other Clinician: Jacqulyn Bath Referring Ellawyn Wogan: [name] Treating Ziara Thelander/Extender: Yaakov Guthrie in Treatment: 4 Visit Information History Since Last Visit Added or deleted any medications: No Patient Arrived: Ambulatory Any new allergies or adverse reactions: No Arrival Time: 10:18 Had a fall or experienced change in No Accompanied By: self activities of daily living that may affect Transfer Assistance: None risk of falls: Patient Identification Verified: Yes Signs or symptoms of abuse/neglect since last visito No Secondary Verification Process Completed: Yes Hospitalized since last visit: No Patient Requires Transmission-Based No Implantable device outside of the clinic excluding No Precautions: cellular tissue based products placed in the center Patient Has Alerts: Yes since last visit: Patient Alerts: Patient on Blood Has Dressing in Place as Prescribed: Yes Thinner Has Footwear/Offloading in Place as Prescribed: Yes DIALYSIS RIGHT ARM Left: Surgical Shoe with Pressure Aspirin 81/Brilinta Relief Insole Pain Present Now: No Electronic Signature(s) Signed: 12/04/2021 12:48:14 PM By: Enedina Finner RCP, RRT, CHT Entered By: Stark Jock, Amado Nash on 12/04/2021 10:27:01 Persing, Random L. ([id number]) -------------------------------------------------------------------------------- Encounter Discharge Information Details Patient Name: Pichette, Davyn L. Date of Service: 12/04/2021 10:30 AM Medical Record Number: [id number] Patient Account Number: [id number] Date of Birth/Sex: 1966-06-30 (56 y.o. M) Treating RN: Donnamarie Poag Primary Care Keelee Yankey: [name] Other Clinician: Jacqulyn Bath Referring Tomie Spizzirri: [name] Treating Mathieu Schloemer/Extender: Yaakov Guthrie in Treatment: 4 Encounter Discharge Information Items Discharge Condition: Stable Ambulatory Status: Ambulatory Discharge Destination: Home Transportation: Private Auto Accompanied By: self Schedule Follow-up Appointment: Yes Clinical Summary of Care: Notes Patient has an HBO treatment scheduled on 12/06/21 at 10:30 am. Electronic Signature(s) Signed: 12/04/2021 12:48:14 PM By: Enedina Finner RCP, RRT, CHT Entered By: Enedina Finner on 12/04/2021 12:47:46 Blowers, Hafiz L. ([id number]) -------------------------------------------------------------------------------- Vitals Details Patient Name: Vila, Ankush L. Date of Service: 12/04/2021 10:30 AM Medical Record Number: [id number] Patient Account Number: [id number] Date of Birth/Sex: Sep 13, 1966 (56 y.o. M) Treating RN: Donnamarie Poag Primary Care Gabrella Stroh: [name] Other Clinician: Jacqulyn Bath Referring Arvie Villarruel: [name] Treating Braylynn Lewing/Extender: Yaakov Guthrie in Treatment: 4 Vital Signs Time Taken: 10:20 Temperature (F): 98.8 Height (in): 70 Pulse (bpm): 72 Weight (lbs): 180.4 Respiratory Rate (breaths/min): 16 Body Mass Index (BMI): 25.9 Blood Pressure (mmHg): 122/66 Capillary Blood Glucose (mg/dl): 139 Reference Range: 80 - 120 mg / dl Electronic Signature(s) Signed: 12/04/2021 12:48:14 PM By: Enedina Finner RCP, RRT, CHT Entered By: Enedina Finner on 12/04/2021 10:27:59

## 2021-12-05 ENCOUNTER — Encounter: Payer: Medicare Other | Admitting: Physician Assistant

## 2021-12-05 DIAGNOSIS — E1122 Type 2 diabetes mellitus with diabetic chronic kidney disease: Secondary | ICD-10-CM | POA: Diagnosis not present

## 2021-12-05 DIAGNOSIS — N186 End stage renal disease: Secondary | ICD-10-CM | POA: Diagnosis not present

## 2021-12-05 DIAGNOSIS — I10 Essential (primary) hypertension: Secondary | ICD-10-CM | POA: Diagnosis not present

## 2021-12-05 DIAGNOSIS — K30 Functional dyspepsia: Secondary | ICD-10-CM | POA: Diagnosis not present

## 2021-12-06 ENCOUNTER — Encounter: Payer: Medicare Other | Admitting: Internal Medicine

## 2021-12-06 ENCOUNTER — Other Ambulatory Visit: Payer: Self-pay

## 2021-12-06 DIAGNOSIS — N2581 Secondary hyperparathyroidism of renal origin: Secondary | ICD-10-CM | POA: Diagnosis not present

## 2021-12-06 DIAGNOSIS — I5042 Chronic combined systolic (congestive) and diastolic (congestive) heart failure: Secondary | ICD-10-CM | POA: Diagnosis not present

## 2021-12-06 DIAGNOSIS — I110 Hypertensive heart disease with heart failure: Secondary | ICD-10-CM | POA: Diagnosis not present

## 2021-12-06 DIAGNOSIS — M86472 Chronic osteomyelitis with draining sinus, left ankle and foot: Secondary | ICD-10-CM | POA: Diagnosis not present

## 2021-12-06 DIAGNOSIS — L97524 Non-pressure chronic ulcer of other part of left foot with necrosis of bone: Secondary | ICD-10-CM | POA: Diagnosis not present

## 2021-12-06 DIAGNOSIS — E11621 Type 2 diabetes mellitus with foot ulcer: Secondary | ICD-10-CM | POA: Diagnosis not present

## 2021-12-06 DIAGNOSIS — E1169 Type 2 diabetes mellitus with other specified complication: Secondary | ICD-10-CM | POA: Diagnosis not present

## 2021-12-06 DIAGNOSIS — M86672 Other chronic osteomyelitis, left ankle and foot: Secondary | ICD-10-CM | POA: Diagnosis not present

## 2021-12-06 DIAGNOSIS — N186 End stage renal disease: Secondary | ICD-10-CM | POA: Diagnosis not present

## 2021-12-06 DIAGNOSIS — D631 Anemia in chronic kidney disease: Secondary | ICD-10-CM | POA: Diagnosis not present

## 2021-12-06 DIAGNOSIS — Z992 Dependence on renal dialysis: Secondary | ICD-10-CM | POA: Diagnosis not present

## 2021-12-06 LAB — GLUCOSE, CAPILLARY
Glucose-Capillary: 164 mg/dL — ABNORMAL HIGH (ref 70–99)
Glucose-Capillary: 103 mg/dL — ABNORMAL HIGH (ref 70–99)

## 2021-12-06 NOTE — Progress Notes (Signed)
Davanzo, Java ([id number]) Visit Report for 12/06/2021 HBO Details Patient Name: [name], [name] Date of Service: 12/06/2021 10:30 AM Medical Record Number: [id number] Patient Account Number: [id number] Date of Birth/Sex: 1966-09-10 (56 y.o. 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 HBO Treatment Details Treatment Number: 20 Patient Type: Outpatient Chamber Type: Monoplace Chamber Serial #: [id number] Treatment Protocol: 2.0 ATA with 90 minutes oxygen, and no air breaks Treatment Details Compression Rate Down: 1.0 psi / minute De-Compression Rate Up: 1.5 psi / minute Compress Tx Pressure Air breaks and breathing periods Decompress Decompress Begins Reached (leave unused spaces blank) Begins Ends Chamber Pressure (ATA) 1 2 - - - - - - 2 1 Clock Time (24 hr) 10:52 11:09 - - - - - - 12:40 12:50 Treatment Length: 118 (minutes) Treatment Segments: 4 Vital Signs Capillary Blood Glucose Reference Range: 80 - 120 mg / dl HBO Diabetic Blood Glucose Intervention Range: <131 mg/dl or >249 mg/dl Time Vitals Blood Respiratory Capillary Blood Glucose Pulse Action Type: Pulse: Temperature: Taken: Pressure: Rate: Glucose (mg/dl): Meter #: Oximetry (%) Taken: Pre 10:40 132/72 66 16 98.3 164 1 none per protocol Post 12:53 126/72 72 16 98.4 103 1 none per protocol Treatment Response Treatment Toleration: Well Treatment Completion Treatment Completed without Adverse Event Status: [name] Notes No concerns with treatment given. Patient was also seen for wound care evaluation today HBO Attestation I certify that I  supervised this HBO treatment in accordance with Medicare guidelines. A trained emergency response team is readily Yes available per hospital policies and procedures. Continue HBOT as ordered. Yes Electronic Signature(s) Signed: 12/06/2021 3:30:14 PM By: Linton Ham MD Previous Signature: 12/06/2021 1:22:44 PM Version By: Enedina Finner RCP, RRT, CHT Entered By: Linton Ham on 12/06/2021 13:40:01 [name], [name] ([id number]) -------------------------------------------------------------------------------- HBO Safety Checklist Details Patient Name: [name], [name] Date of Service: 12/06/2021 10:30 AM Medical Record Number: [id number] Patient Account Number: [id number] Date of Birth/Sex: Sep 12, 1966 (56 y.o. M) Treating RN: Donnamarie Poag Primary Care Anfernee Peschke: [name] Other Clinician: Jacqulyn Bath Referring Damon Baisch: [name] Treating [name]/Extender: Tito Dine in Treatment: 5 HBO Safety Checklist Items Safety Checklist Consent Form Signed Patient voided / foley secured and emptied When did you last eato 10:00 am Last dose of injectable or oral agent n/a Ostomy pouch emptied and vented if applicable NA All implantable devices assessed, documented and approved NA Intravenous access site secured and place NA Valuables secured Linens and cotton and cotton/polyester blend (less than 51% polyester) Personal oil-based products / skin lotions / body lotions removed Wigs or hairpieces removed NA Smoking or tobacco materials removed NA Books / newspapers / magazines / loose paper removed NA Cologne, aftershave, perfume and deodorant removed Jewelry removed (may wrap wedding band) Make-up removed NA Hair care products removed Battery operated devices (external) removed NA Heating patches and chemical warmers removed NA Titanium eyewear removed NA Nail polish cured greater than 10 hours NA Casting material cured greater than 10  hours NA Hearing aids removed NA Loose dentures or partials removed NA Prosthetics have been removed NA Patient demonstrates correct use of air break device (if applicable) Patient concerns have been addressed Patient grounding bracelet on and cord attached to chamber Specifics  for Inpatients (complete in addition to above) Medication sheet sent with patient Intravenous medications needed or due during therapy sent with patient Drainage tubes (e.g. nasogastric tube or chest tube secured and vented) Endotracheal or Tracheotomy tube secured Cuff deflated of air and inflated with saline Airway suctioned Electronic Signature(s) Signed: 12/06/2021 1:22:44 PM By: Enedina Finner RCP, RRT, CHT Entered By: Enedina Finner on 12/06/2021 10:56:07

## 2021-12-06 NOTE — Progress Notes (Signed)
[name], [place] ([id number]) Visit Report for 12/06/2021 HPI Details Patient Name: [name], [name]. Date of Service: 12/06/2021 1:15 PM Medical Record Number: [id number] Patient Account Number: [id number] Date of Birth/Sex: [birth year] (56 y.o. M) Treating RN: [name] Primary Care Provider: [name] Other Clinician: Referring Provider: [name] Treating Provider/Extender: Tito Dine in Treatment: 5 History of Present Illness HPI Description: 10/31/2021 patient presents today for evaluation here in our clinic. He is actually being evaluated for hyperbaric oxygen therapy only. He has been seen at [facility] up to this point he is under the care of the vascular surgery specialty. Subsequently he is also under the care of the hyperbaric center there. With that being said that due to the fact that he is a dialysis patient he was actually recommended to be transferred to [place] for his treatments he actually lives in [place] which is where his dialysis is. It was a hardship for him to try to get from [place] especially on dialysis days all the way to [facility] get into the facility for his treatment and get back home he was literally spending all day long. He has had just 4 treatments there at [facility] thus far based on what I see in these have not been continued while he awaits approval here in our clinic. Subsequently I do have records for review that will be included in the HPI predetermination review and attached to this chart as well. I will not duplicate that here. 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 Nonetheless he continues to have exposed bone with marked soft tissue swelling compatible with osteomyelitis. This is due to the irregularity of the head of the metatarsal as well. Nonetheless along with having  associated cellulitis he is also good to be seen by infectious disease and is currently on antibiotics for this as well. Again that will be detailed in the pretreatment review section which I will attach to this note as well. He does currently have a wound VAC in place and again the wound was not evaluated here in the clinic as that is being completely managed by home health and [facility]. The patient does have a history of diabetes mellitus type 2, hypertension, coronary artery disease, and congestive heart failure. He also has cataracts of both eyes but no evidence of glaucoma on his most recent eye exam. 11/22/2021 we have been seeing this gentleman for hyperbaric oxygen therapy but have not actually evaluated his wound up to this point this was being managed by the wound care and vascular team I presumed at [place] at least that is what has been told to be previous. Nonetheless at this point I got a call from Atlee Abide who is a Librarian, academic at the [facility] vascular clinic with Dr. Durene Fruits and subsequently they were wanting to know if I could take over wound care for this patient. Apparently they do not have the ability for an office debridement which again I completely understand but nonetheless definitely is not an integral part of his healing along with the hyperbarics. Subsequently I discussed with the patient today that I do believe he would benefit going ahead and proceeding with evaluation here in the clinic for that reason I did go ahead and see him today to get things started. There is also been some confusion about  the issues with his home health nurse and getting measurements to [facility] for the wound VAC in order to continue his wound VAC therapy. With that being said after I see him today we will make a determination on what to do with wound VAC we can definitely send measurements to [facility] but again the bigger question is whether this is the best way to go or not. 11/28/2021 upon  evaluation today patient appears to be doing decently well in regard to his wound. He has been tolerating the dressing changes with the Dakin's moistened gauze dressing which I think is doing a good job. Fortunately I do not see any signs of active infection locally nor systemically at this point. In general I think that he is actually making excellent progress which is great news. 2/17; seen in conjunction with HBO today. 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 Much improved condition of the wound surface. On the lateral foot and eschared area that he has been using Betadine. Tolerating HBO well Electronic Signature(s) Signed: 12/06/2021 3:30:14 PM By: Linton Ham MD Entered By: Linton Ham on 12/06/2021 13:23:13 Wilhelmsen, [place] ([id number]) -------------------------------------------------------------------------------- Physical Exam Details Patient Name: [name], [name] Date of Service: 12/06/2021 1:15 PM Medical Record Number: [id number] Patient Account Number: [id number] Date of Birth/Sex: 1966/03/27 (56 y.o. M) Treating RN: [name] Primary Care Provider: [name] Other Clinician: Referring Provider: [name] Treating Provider/Extender: Tito Dine in Treatment: 5 Constitutional Sitting or standing Blood Pressure is within target range for patient.. Pulse regular and within target range for patient.Marland Kitchen Respirations regular, non- labored and within target range.. Temperature is normal and within the target range for the patient.Marland Kitchen appears in no distress. Notes Wound exam; left TMA lateral aspect of the foot. Surface of this looks somewhat better by description of her staff. Still some debris on the surface I think another week of Dakin's is probably in order. On the left lateral foot there is an eschared area that is separating still using Betadine here. He tolerates HBO well Electronic Signature(s) Signed: 12/06/2021  3:30:14 PM By: Linton Ham MD Entered By: Linton Ham on 12/06/2021 13:29:03 Sandner, [place] ([id number]) -------------------------------------------------------------------------------- Physician Orders Details Patient Name: Radney, Teancum L. Date of Service: 12/06/2021 1:15 PM Medical Record Number: [id number] Patient Account Number: [id number] Date of Birth/Sex: Aug 20, 1966 (56 y.o. M) Treating RN: [name] Primary Care Provider: [name] Other Clinician: Referring Provider: [name] Treating Provider/Extender: Tito Dine in Treatment: 5 Verbal / Phone Orders: No Diagnosis Coding Follow-up Appointments o Return Appointment in 1 week. - provider visits at wound center o Return Appointment in: - HBO- (daily Monday -Friday for HBO Treatments) [facility] for wound care. May utilize formulary equivalent dressing for wound treatment orders unless otherwise specified. Home Health Nurse may visit PRN to address patientos wound care needs. o **Please direct any NON-WOUND related issues/requests for orders to patient's Primary Care Physician. **If current dressing causes regression in wound condition, may D/C ordered dressing product/s and apply Normal Saline Moist Dressing daily until next [place] or Other MD appointment. **Notify Wound Healing Center of regression in wound condition at [phone]. o Other Home Health Orders/Instructions: - HOLD WOUND VAC AT THIS TIME Bathing/ Shower/ Hygiene o Clean wound with Normal Saline or wound cleanser. o May shower with wound dressing protected with water repellent cover or cast protector. o  No tub bath. Additional Orders / Instructions o Follow Nutritious Diet and Increase Protein Intake o Other: - Follow vascular, nephrology and PCP for wound care and other needs Dialysis appt as scheduled M-W-F Hyperbaric Oxygen  Therapy o Indication and location: - 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 o Follow Hyperbaric Oxygen Glycemia Protocol Wound Treatment Wound #1 - Amputation Site - Digit Wound Laterality: Left Cleanser: Normal Saline 1 x Per Day/30 Days Discharge Instructions: Wash your hands with soap and water. Remove old dressing, discard into plastic bag and place into trash. Cleanse the wound with Normal Saline prior to applying a clean dressing using gauze sponges, not tissues or cotton balls. Do not scrub or use excessive force. Pat dry using gauze sponges, not tissue or cotton balls. Cleanser: Vashe 5.8 (oz) 1 x Per Day/30 Days Discharge Instructions: Use vashe 5.8 (oz) as directed Cleanser: Wound Cleanser 1 x Per Day/30 Days Discharge Instructions: Wash your hands with soap and water. Remove old dressing, discard into plastic bag and place into trash. Cleanse the wound with Wound Cleanser prior to applying a clean dressing using gauze sponges, not tissues or cotton balls. Do not scrub or use excessive force. Pat dry using gauze sponges, not tissue or cotton balls. Primary Dressing: Gauze 1 x Per Day/30 Days Discharge Instructions: VASHE/DAKINS-As directed: dry, moistened with saline or moistened with Dakins Solution Secondary Dressing: ABD Pad 5x9 (in/in) 1 x Per Day/30 Days Discharge Instructions: Cover with ABD pad Secured With: 38M Medipore H Soft Cloth Surgical Tape, 2x2 (in/yd) 1 x Per Day/30 Days Secured With: Kerlix Roll Sterile or Non-Sterile 6-ply 4.5x4 (yd/yd) 1 x Per Day/30 Days Discharge Instructions: Apply Kerlix as directed Bartol, [place] ([id number]) Wound #2  - Foot Wound Laterality: Left, Lateral Topical: Betadine Discharge Instructions: paint wound bed, allow to dry Secondary Dressing: Kerlix 4.5 x 4.1 (in/yd) Discharge Instructions: lightly GLYCEMIA INTERVENTIONS PROTOCOL PRE-HBO GLYCEMIA INTERVENTIONS ACTION INTERVENTION Obtain pre-HBO capillary blood glucose (ensure 1 physician order is in chart). A. Notify HBO physician and await physician orders. 2 If result is 70 mg/dl or below: B. If the result meets the hospital definition of a critical result, follow hospital policy. A. Give patient an 8 ounce Glucerna Shake, an 8 ounce Ensure, or 8 ounces of a Glucerna/Ensure equivalent dietary supplement*. B. Wait 30 minutes. If result is 71 mg/dl to 130 mg/dl: C. Retest patientos capillary blood glucose (CBG). D. If result greater than or equal to 110 mg/dl, proceed with HBO. If result less than 110 mg/dl, notify HBO physician and consider holding HBO. If result is 131 mg/dl to 249 mg/dl: A. Proceed with HBO. A. Notify HBO physician and await physician orders. B. It is recommended to hold HBO and do If result is 250 mg/dl or greater: blood/urine ketone testing. C. If the result meets the hospital definition of a critical result, follow hospital policy. POST-HBO GLYCEMIA INTERVENTIONS ACTION INTERVENTION Obtain post HBO capillary blood glucose (ensure 1 physician order is in chart). A. Notify HBO physician and await physician orders. 2 If result is 70 mg/dl or below: B. If the result meets the hospital definition of a critical result, follow hospital policy. A. Give patient an 8 ounce  Glucerna Shake, an 8 ounce Ensure, or 8 ounces of a Glucerna/Ensure equivalent dietary supplement*. B. Wait 15 minutes for symptoms of hypoglycemia (i.e. nervousness, anxiety, If result is 71 mg/dl to 100 mg/dl: sweating, chills, clamminess, irritability, confusion, tachycardia or dizziness). C. If patient asymptomatic,  discharge patient. If patient symptomatic, repeat capillary blood glucose (CBG) and notify HBO physician. If result is 101 mg/dl to 249 mg/dl: A. Discharge patient. A. Notify HBO physician and await physician orders. B. It is recommended to do blood/urine If result is 250 mg/dl or greater: ketone testing. C. If the result meets the hospital definition of a critical result, follow hospital policy. *Juice or candies are NOT equivalent products. If patient refuses the Glucerna or Ensure, please consult the hospital dietitian for an appropriate substitute. Electronic Signature(s) Signed: 12/06/2021 1:50:10 PM By: [name] RN Signed: 12/06/2021 3:30:14 PM By: Linton Ham MD Entered By: [name] on 12/06/2021 13:44:41 Huestis, Holt L. ([id number]) Leverette, [place] ([id number]) -------------------------------------------------------------------------------- Problem List Details Patient Name: Baxendale, Emmitt L. Date of Service: 12/06/2021 1:15 PM Medical Record Number: [id number] Patient Account Number: [id number] Date of Birth/Sex: [birth year] (56 y.o. 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 No Yes bone M86.672 Other chronic osteomyelitis, left ankle and foot 10/31/2021 No Yes I10 Essential (primary) hypertension 10/31/2021 No Yes I25.10 Atherosclerotic heart disease of native coronary artery without angina 10/31/2021 No Yes pectoris I50.42 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 Date of Service: 12/06/2021 1:15 PM Medical Record Number: [id number] Patient Account Number: [id number] Date of Birth/Sex: 1966-02-22 (56 y.o. M) Treating RN: [name] Primary Care Provider: [name] Other Clinician: Referring Provider: [name] Treating Provider/Extender: Tito Dine in Treatment: 5 Subjective History of Present Illness (HPI) 10/31/2021 patient presents today for evaluation here in our clinic. He is actually being evaluated for hyperbaric oxygen therapy only. He has been seen at [facility] up to this point he is under the care of the vascular surgery specialty. Subsequently he is also under the care of the hyperbaric center there. With that being said that due to the fact that he is a dialysis patient he was actually recommended to be transferred to [place] for his treatments he actually lives in [place] which is where his dialysis is. It was a hardship for him to try to get from [place] especially on dialysis days all the way to [facility] get into the facility for his treatment and get back home he was literally spending all day long. He has had just 4 treatments there at [facility] thus far based on what I see in these have not been continued while he awaits approval here in our clinic. Subsequently I do have records for review that will be included in the HPI predetermination review and attached to this chart as well. I will not duplicate that here. 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 Nonetheless he continues to have exposed bone with  marked soft tissue swelling compatible with osteomyelitis. This is due to the irregularity of the head of the metatarsal as well. Nonetheless along with having associated cellulitis he is also good to be seen by infectious disease and is currently on antibiotics for this as well. Again that will be detailed in the pretreatment review section which I will attach to this note as well. He does currently have a wound VAC in place and again the wound was not evaluated here in the clinic as that is being completely managed by home health and [facility]. The patient does have a history of diabetes mellitus type 2, hypertension, coronary artery disease, and congestive heart failure. He also has cataracts of both eyes but no evidence of glaucoma on his most recent eye exam. 11/22/2021 we have been seeing this gentleman for hyperbaric oxygen therapy but have not actually evaluated his wound up to this point this was being managed by the wound care and vascular team I presumed at [place] at least that is what has been told to be previous. Nonetheless at this point I got a call from Atlee Abide who is a Librarian, academic at the [facility] vascular clinic with Dr. Durene Fruits and subsequently they were wanting to know if I could take over wound care for this patient. Apparently they do not have the ability for an office debridement which again I completely understand but nonetheless definitely is not an integral part of his healing along with the hyperbarics. Subsequently I discussed with the patient today that I do believe he would benefit going ahead and proceeding with evaluation here in the clinic for that reason I did go ahead and see him today to get things started. There is also been some confusion about the issues with his home health nurse and getting measurements to [facility] for the wound VAC in order to continue his wound VAC therapy. With that being said after I see him today we will make a determination on  what to do with wound VAC we can definitely send measurements to [facility] but again the bigger question is whether this is the best way to go or not. 11/28/2021 upon evaluation today patient appears to be doing decently well in regard to his wound. He has been tolerating the dressing changes with the Dakin's moistened gauze dressing which I think is doing a good job. Fortunately I do not see any signs of active infection locally nor systemically at this point. In general I think that he is actually making excellent progress which is great news. 2/17; seen in conjunction with HBO today. 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 Much improved condition of the wound surface. On the lateral foot and eschared area that he has been using Betadine. Tolerating HBO well Objective Constitutional Sitting or standing Blood Pressure is within target range for patient.. Pulse regular and within target range for patient.Marland Kitchen Respirations regular, non- labored and within target range.. Temperature is normal and within the target range for the patient.Marland Kitchen appears in no distress. Vitals Time Taken: 1:09 PM, Height: 70 in, Weight: 180.4 lbs, BMI: 25.9, Temperature: 98.4 F, Pulse: 72 bpm, Respiratory Rate: 16 breaths/min, Blood Pressure: 126/72 mmHg. General Notes: Wound exam; left TMA lateral aspect of the foot. Surface of this looks somewhat better by description of her staff. Still  some debris on the surface I think another week of Dakin's is probably in order. On the left lateral foot there is an eschared area that is separating still using Betadine here. He tolerates HBO well Integumentary (Hair, Skin) Wound #1 status is Open. Original cause of wound was Surgical Injury. The date acquired was: 09/09/2021. The wound has been in treatment 2 weeks. 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 The wound measures 6.5cm length x 2.6cm width x 2.5cm depth; 13.273cm^2 area and  33.183cm^3 volume. There is muscle, tendon, Fat Layer (Subcutaneous Tissue), and fascia exposed. There is no tunneling or undermining noted. There is a large amount of serosanguineous drainage noted. There is medium (34-66%) red granulation within the wound bed. There is a medium (34-66%) amount of necrotic tissue within the wound bed including Adherent Slough and Necrosis of Muscle. Battie, [place]ra[place] ([id number]) Wound #2 status is Open. Original cause of wound was Blister. The date acquired was: 10/28/2021. The wound has been in treatment 1 weeks. The wound is located on the Left,Lateral Foot. The wound measures 2cm length x 2cm width x 0.1cm depth; 3.142cm^2 area and 0.314cm^3 volume. There is no tunneling or undermining noted. There is a none present amount of drainage noted. There is no granulation within the wound bed. There is a large (67-100%) amount of necrotic tissue within the wound bed including Eschar. 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 - provider visits at wound center Return Appointment in: - HBO- (daily Monday -Friday for HBO Treatments) Home Health: [place] for wound care. May utilize formulary equivalent dressing for wound treatment orders unless otherwise specified. Home Health Nurse may visit PRN to address patient s wound care needs. **Please direct any NON-WOUND related issues/requests for orders to patient's Primary Care Physician. **If current dressing causes regression in wound condition, may D/C ordered dressing  product/s and apply Normal Saline Moist Dressing daily until next [place] or Other MD appointment. **Notify Wound Healing Center of regression in wound condition at [phone]. Other Home Health Orders/Instructions: - HOLD WOUND VAC AT THIS TIME Bathing/ Shower/ Hygiene: Clean wound with Normal Saline or wound cleanser. May shower with wound dressing protected with water repellent cover or cast protector. No tub bath. Additional Orders / Instructions: Follow Nutritious Diet and Increase Protein Intake Other: - Follow vascular, nephrology and PCP for wound care and other needs Dialysis appt as scheduled M-W-F Hyperbaric Oxygen Therapy: Indication and location: - Left 1st and 2nd Ray Amputation osteomyelitis If appropriate for treatment, begin HBOT per protocol: 2.0 ATA for 90 Minutes without Air Breaks One treatment per day (delivered Monday through Friday unless otherwise specified in Special Instructions below): Total # of Treatments: - 40 Finger stick Blood Glucose Pre- and Post- HBOT Treatment. Follow Hyperbaric Oxygen Glycemia Protocol WOUND #1: - Amputation Site - Digit Wound Laterality: Left Cleanser: Normal Saline 1 x Per Day/30 Days Discharge Instructions: Wash your hands with soap and water. Remove old dressing, discard into plastic bag and place into trash. Cleanse the wound with Normal Saline prior to applying a clean dressing using gauze sponges, not tissues or cotton  balls. Do not scrub or use excessive force. Pat dry using gauze sponges, not tissue or cotton balls. Cleanser: Vashe 5.8 (oz) 1 x Per Day/30 Days Discharge Instructions: Use vashe 5.8 (oz) as directed Cleanser: Wound Cleanser 1 x Per Day/30 Days Discharge Instructions: Wash your hands with soap and water. Remove old dressing, discard into plastic bag and place into trash. Cleanse the wound with Wound Cleanser prior to applying a clean dressing using gauze sponges, not tissues or cotton balls. Do  not scrub or use excessive force. Pat dry using gauze sponges, not tissue or cotton balls. Primary Dressing: Gauze 1 x Per Day/30 Days Discharge Instructions: VASHE/DAKINS-As directed: dry, moistened with saline or moistened with Dakins Solution Secondary Dressing: ABD Pad 5x9 (in/in) 1 x Per Day/30 Days Discharge Instructions: Cover with ABD pad Secured With: 42M Medipore H Soft Cloth Surgical Tape, 2x2 (in/yd) 1 x Per Day/30 Days Secured With: Kerlix Roll Sterile or Non-Sterile 6-ply 4.5x4 (yd/yd) 1 x Per Day/30 Days Discharge Instructions: Apply Kerlix as directed WOUND #2: - Foot Wound Laterality: Left, Lateral Topical: Betadine Discharge Instructions: Apply betadine as directed. Primary Dressing: Gauze Discharge Instructions: As directed: dry, moistened with saline or moistened with Dakins Solution Barich, Sher L. ([id number]) Secondary Dressing: ABD Pad 5x9 (in/in) Discharge Instructions: Cover with ABD pad 1. Did not change his primary dressings on the left foot 2. As the surface of the granulation cleans up fairly nicely he does not have undermining. I wonder about an advanced treatment product here noting that he is already not had a good result from the wound VAC Electronic Signature(s) Signed: 12/06/2021 3:30:14 PM By: Linton Ham MD Entered By: Linton Ham on 12/06/2021 13:29:53 Kibble, Standish ([id number]) -------------------------------------------------------------------------------- SuperBill Details Patient Name: Cobbins, Lijah L. Date of Service: 12/06/2021 Medical Record Number: [id number] Patient Account Number: [id number] Date of Birth/Sex: 01/18/1966 (56 y.o. 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

## 2021-12-06 NOTE — Progress Notes (Signed)
[name] ([id number]) Visit Report for 12/06/2021 Arrival Information Details Patient Name: [name], [name]. Date of Service: 12/06/2021 1:15 PM Medical Record Number: [id number] Patient Account Number: [id number] Date of Birth/Sex: [birth year] (56 y.o. M) Treating RN: [name] Primary Care Marrisa Kimber: [name] Other Clinician: Referring Wren Gallaga: [name] Treating Chandrea Zellman/Extender: Tito Dine in Treatment: 5 Visit Information History Since Last Visit All ordered tests and consults were completed: No Patient Arrived: Ambulatory Added or deleted any medications: No Arrival Time: 13:08 Any new allergies or adverse reactions: No Accompanied By: self Had a fall or experienced change in No Transfer Assistance: None activities of daily living that may affect Patient Identification Verified: Yes risk of falls: Secondary Verification Process Completed: Yes Signs or symptoms of abuse/neglect since last visito No Patient Requires Transmission-Based No Hospitalized since last visit: No Precautions: Implantable device outside of the clinic excluding No Patient Has Alerts: Yes cellular tissue based products placed in the center Patient Alerts: Patient on Blood since last visit: Thinner Has Dressing in Place as Prescribed: Yes DIALYSIS RIGHT ARM Pain Present Now: No Aspirin 81/Brilinta Electronic Signature(s) Signed: 12/06/2021 1:50:10 PM By: [name] RN Entered By: [name] on 12/06/2021 13:09:19 [name] ([id number]) -------------------------------------------------------------------------------- Clinic Level of Care Assessment Details Patient Name: [name], [name] Date of Service: 12/06/2021 1:15 PM Medical Record Number: [id number] Patient Account Number: [id number] Date of Birth/Sex: 1966/02/14 (56 y.o. M) Treating RN: [name] Primary Care Tremon Sainvil: [name] Other Clinician: Referring Josehua Hammar: [name] Treating  Kayzen Kendzierski/Extender: Tito Dine in Treatment: 5 Clinic Level of Care Assessment Items TOOL 4 Quantity Score X - Use when only an EandM is performed on FOLLOW-UP visit 1 0 ASSESSMENTS - Nursing Assessment / Reassessment X - Reassessment of Co-morbidities (includes updates in patient status) 1 10 X- 1 5 Reassessment of Adherence to Treatment Plan ASSESSMENTS - Wound and Skin Assessment / Reassessment []  - Simple Wound Assessment / Reassessment - one wound 0 X- 2 5 Complex Wound Assessment / Reassessment - multiple wounds []  - 0 Dermatologic / Skin Assessment (not related to wound area) ASSESSMENTS - Focused Assessment []  - Circumferential Edema Measurements - multi extremities 0 []  - 0 Nutritional Assessment / Counseling / Intervention []  - 0 Lower Extremity Assessment (monofilament, tuning fork, pulses) []  - 0 Peripheral Arterial Disease Assessment (using hand held doppler) ASSESSMENTS - Ostomy and/or Continence Assessment and Care []  - Incontinence Assessment and Management 0 []  - 0 Ostomy Care Assessment and Management (repouching, etc.) PROCESS - Coordination of Care X - Simple Patient / Family Education for ongoing care 1 15 []  - 0 Complex (extensive) Patient / Family Education for ongoing care []  - 0 Staff obtains Programmer, systems, Records, Test Results / Process Orders []  - 0 Staff telephones HHA, Nursing Homes / Clarify orders / etc []  - 0 Routine Transfer to another Facility (non-emergent condition) []  - 0 Routine Hospital Admission (non-emergent condition) []  - 0 New Admissions / Biomedical engineer / Ordering NPWT, Apligraf, etc. []  - 0 Emergency Hospital Admission (emergent condition) X- 1 10 Simple Discharge Coordination []  - 0 Complex (extensive) Discharge Coordination PROCESS - Special Needs []  - Pediatric / Minor Patient Management 0 []  - 0 Isolation Patient Management []  - 0 Hearing / Language / Visual special needs []  - 0 Assessment of  Community assistance (transportation, D/C planning, etc.) []  - 0 Additional assistance / Altered mentation []  - 0 Support Surface(s) Assessment (bed, cushion, seat, etc.) INTERVENTIONS - Wound Cleansing /  Measurement [name], [name] ([id number]) []  - 0 Simple Wound Cleansing - one wound X- 2 5 Complex Wound Cleansing - multiple wounds []  - 0 Wound Imaging (photographs - any number of wounds) X- 1 5 Wound Tracing (instead of photographs) []  - 0 Simple Wound Measurement - one wound X- 2 5 Complex Wound Measurement - multiple wounds INTERVENTIONS - Wound Dressings X - Small Wound Dressing one or multiple wounds 1 10 X- 1 15 Medium Wound Dressing one or multiple wounds []  - 0 Large Wound Dressing one or multiple wounds X- 1 5 Application of Medications - topical []  - 0 Application of Medications - injection INTERVENTIONS - Miscellaneous []  - External ear exam 0 []  - 0 Specimen Collection (cultures, biopsies, blood, body fluids, etc.) []  - 0 Specimen(s) / Culture(s) sent or taken to Lab for analysis []  - 0 Patient Transfer (multiple staff / Civil Service fast streamer / Similar devices) []  - 0 Simple Staple / Suture removal (25 or less) []  - 0 Complex Staple / Suture removal (26 or more) []  - 0 Hypo / Hyperglycemic Management (close monitor of Blood Glucose) []  - 0 Ankle / Brachial Index (ABI) - do not check if billed separately X- 1 5 Vital Signs Has the patient been seen at the hospital within the last three years: Yes Total Score: 110 Level Of Care: New/Established - Level 3 Electronic Signature(s) Signed: 12/06/2021 1:50:10 PM By: [name] RN Entered By: [name] on 12/06/2021 13:19:22 Sons, [name] ([id number]) -------------------------------------------------------------------------------- Encounter Discharge Information Details Patient Name: [name], [name] Date of Service: 12/06/2021 1:15 PM Medical Record Number: [id number] Patient Account Number: [id number] Date  of Birth/Sex: 11/13/1965 (56 y.o. M) Treating RN: [name] Primary Care Annalia Metzger: [name] Other Clinician: Referring Jailine Lieder: [name] Treating Bartt Gonzaga/Extender: Tito Dine in Treatment: 5 Encounter Discharge Information Items Discharge Condition: Stable Ambulatory Status: Ambulatory Discharge Destination: Home Transportation: Private Auto Accompanied By: self Schedule Follow-up Appointment: Yes Clinical Summary of Care: Patient Declined Electronic Signature(s) Signed: 12/06/2021 1:45:31 PM By: [name] RN Entered By: [name] on 12/06/2021 13:45:31 Burpee, Esparto ([id number]) -------------------------------------------------------------------------------- Lower Extremity Assessment Details Patient Name: Fujikawa, [name] Date of Service: 12/06/2021 1:15 PM Medical Record Number: [id number] Patient Account Number: [id number] Date of Birth/Sex: Oct 04, 1966 (57 y.o. M) Treating RN: [name] Primary Care Lasondra Hodgkins: [name] Other Clinician: Referring Jahmai Finelli: [name] Treating Renaldo Gornick/Extender: Tito Dine in Treatment: 5 Vascular Assessment Pulses: Dorsalis Pedis Palpable: [Left:Yes] Electronic Signature(s) Signed: 12/06/2021 1:50:10 PM By: [name] RN Entered By: [name] on 12/06/2021 13:16:10 Makris, [place] ([id number]) -------------------------------------------------------------------------------- Multi Wound Chart Details Patient Name: Bleich, [name] Date of Service: 12/06/2021 1:15 PM Medical Record Number: [id number] Patient Account Number: [id number] Date of Birth/Sex: [birth year] (56 y.o. 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 Comorbid History: Cataracts, Anemia, Sleep Apnea, Cataracts, Anemia, Sleep Apnea, N/A Arrhythmia, Congestive Heart Arrhythmia, Congestive Heart Failure, Coronary Artery Disease, Failure, Coronary Artery Disease, Hypertension, Myocardial Infarction, Hypertension, Myocardial Infarction, Type II Diabetes, Neuropathy, Type II Diabetes, Neuropathy, Seizure Disorder Seizure Disorder Date Acquired: 09/09/2021 10/28/2021 N/A  Weeks of Treatment: 2 1 N/A Wound Status: Open Open N/A Wound Recurrence: No No N/A Measurements L x W x D (cm) 6.5x2.6x2.5 2x2x0.1 N/A Area (cm) : 13.273 3.142 N/A Volume (cm) : 33.183 0.314 N/A % Reduction in Area: 6.10% 0.00% N/A % Reduction in Volume: 19.10% 0.00% N/A Classification: Full Thickness With Exposed Unable to visualize wound bed N/A Support Structures Exudate Amount: Large None Present N/A Exudate Type: Serosanguineous N/A N/A Exudate Color: red, brown N/A N/A Granulation Amount: Medium (34-66%) None Present (0%) N/A Granulation Quality: Red N/A N/A Necrotic Amount: Medium (34-66%) Large (67-100%) N/A Necrotic Tissue: Adherent Slough Eschar N/A Exposed Structures: Fascia: Yes Fascia: No N/A Fat Layer (Subcutaneous Tissue): Fat Layer (Subcutaneous Tissue): Yes No Tendon: Yes Tendon: No Muscle: Yes Muscle: No Joint: No Joint: No Bone: No Bone: No Epithelialization: N/A None N/A Treatment Notes Electronic Signature(s) Stalker, [name] ([id number]) Signed: 12/06/2021 1:50:10 PM By: [name] RN Entered By: [name] on 12/06/2021 13:17:20 Arterberry, Ruddy L. ([id number]) -------------------------------------------------------------------------------- [place] Details Patient Name: Silberman, [name] DYMENT. Date of Service: 12/06/2021 1:15 PM Medical Record Number: [id number] Patient Account Number: [id number] Date of Birth/Sex: 04-04-1966 (56 y.o. M) Treating RN: [name] Primary Care Calle Schader: [name] Other Clinician: Referring Melva Faux: [name] Treating Eldene Plocher/Extender: Tito Dine in Treatment: 5 Active Inactive HBO Nursing Diagnoses: Anxiety related to feelings of confinement associated with the hyperbaric oxygen chamber Anxiety related to knowledge deficit of hyperbaric oxygen therapy and treatment procedures Discomfort related to temperature and humidity changes inside hyperbaric chamber Potential for barotraumas to ears, sinuses, teeth, and lungs or cerebral gas embolism related to changes in atmospheric pressure inside hyperbaric oxygen chamber Potential for oxygen toxicity seizures related to delivery of 100% oxygen at an increased atmospheric pressure Potential for pulmonary oxygen toxicity related to delivery of 100% oxygen at an increased atmospheric pressure Goals: Barotrauma will be prevented during HBO2 Date Initiated: 10/31/2021 Target Resolution Date: 12/06/2021 Goal Status: Active Patient and/or family will be able to state/discuss factors appropriate to the management of their disease process during treatment Date Initiated: 10/31/2021 Target Resolution Date: 11/15/2021 Goal Status: Active Patient will tolerate the hyperbaric oxygen therapy treatment Date Initiated: 10/31/2021 Target Resolution Date: 12/06/2021 Goal Status: Active Patient will tolerate the internal climate of the chamber Date Initiated: 10/31/2021 Target Resolution Date: 11/29/2021 Goal Status: Active Patient/caregiver will verbalize understanding of HBO goals, rationale, procedures and potential hazards Date Initiated: 10/31/2021 Target Resolution Date: 11/19/2021 Goal Status: Active Signs and symptoms of pulmonary oxygen  toxicity will be recognized and promptly addressed Date Initiated: 10/31/2021 Target Resolution Date: 12/13/2021 Goal Status: Active Signs and symptoms of seizure will be recognized and promptly addressed ; seizing patients will suffer no harm Date Initiated: 10/31/2021 Target Resolution Date: 12/20/2021 Goal Status: Active Interventions: Administer a five (5) minute air break for patient if signs and symptoms of seizure appear and notify the hyperbaric physician Administer a ten (10) minute air break for patient if signs and symptoms of seizure appear and notify the hyperbaric physician Administer the correct therapeutic gas delivery based on the patients needs and limitations, per physician order Assess and provide for patientos comfort related to the hyperbaric environment and equalization of middle ear Assess for signs and symptoms related to adverse events, including but not limited to confinement anxiety, pneumothorax, oxygen toxicity and baurotrauma Assess patient for any history of confinement anxiety Assess patient's knowledge and expectations regarding hyperbaric medicine and provide education related to the hyperbaric environment, goals  of treatment and prevention of adverse events Notes: Necrotic Tissue Nursing Diagnoses: Impaired tissue integrity related to necrotic/devitalized tissue Knowledge deficit related to management of necrotic/devitalized tissue Holaday, [name] ([id number]) Goals: Necrotic/devitalized tissue will be minimized in the wound bed Date Initiated: 11/28/2021 Target Resolution Date: 11/28/2021 Goal Status: Active Patient/caregiver will verbalize understanding of reason and process for debridement of necrotic tissue Date Initiated: 11/28/2021 Target Resolution Date: 11/28/2021 Goal Status: Active Interventions: Assess patient pain level pre-, during and post procedure and prior to discharge Provide education on necrotic tissue and debridement process Treatment  Activities: Apply topical anesthetic as ordered : 11/28/2021 Excisional debridement : 11/28/2021 Notes: Wound/Skin Impairment Nursing Diagnoses: Impaired tissue integrity Knowledge deficit related to smoking impact on wound healing Knowledge deficit related to ulceration/compromised skin integrity Goals: Ulcer/skin breakdown will have a volume reduction of 30% by week 4 Date Initiated: 11/28/2021 Target Resolution Date: 12/12/2021 Goal Status: Active Interventions: Assess patient/caregiver ability to obtain necessary supplies Assess patient/caregiver ability to perform ulcer/skin care regimen upon admission and as needed Assess ulceration(s) every visit Treatment Activities: Topical wound management initiated : 11/28/2021 Notes: Electronic Signature(s) Signed: 12/06/2021 1:50:10 PM By: [name] RN Entered By: [name] on 12/06/2021 13:17:10 Niemeier, [name] ([id number]) -------------------------------------------------------------------------------- Pain Assessment Details Patient Name: Sparks, [name] Date of Service: 12/06/2021 1:15 PM Medical Record Number: [id number] Patient Account Number: [id number] Date of Birth/Sex: Nov 06, 1965 (56 y.o. M) Treating RN: [name] Primary Care Odarius Dines: [name] Other Clinician: Referring Meiko Ives: [name] Treating Corbin Hott/Extender: Tito Dine in Treatment: 5 Active Problems Location of Pain Severity and Description of Pain Patient Has Paino Yes Site Locations With Dressing Change: Yes Duration of the Pain. Constant / Intermittento Intermittent How Long Does it Lasto Hours: Minutes: 15 Rate the pain. Current Pain Level: 0 Worst Pain Level: 8 Least Pain Level: 0 Tolerable Pain Level: 5 Character of Pain Describe the Pain: Aching, Burning, Throbbing Pain Management and Medication Current Pain Management: Medication: Yes Cold Application: No Rest: Yes Massage: No Activity: No T.E.N.S.: No Heat  Application: No Leg drop or elevation: No Is the Current Pain Management Adequate: Inadequate How does your wound impact your activities of daily livingo Sleep: Yes Bathing: No Appetite: Yes Relationship With Others: No Bladder Continence: No Emotions: No Bowel Continence: No Work: No Toileting: No Drive: No Dressing: No Hobbies: No Electronic Signature(s) Signed: 12/06/2021 1:50:10 PM By: [name] RN Entered By: [name] on 12/06/2021 13:10:50 Hinostroza, [name] ([id number]) -------------------------------------------------------------------------------- Patient/Caregiver Education Details Patient Name: [place], [name] Date of Service: 12/06/2021 1:15 PM Medical Record Number: [id number] Patient Account Number: [id number] Date of Birth/Gender: 19-May-1966 (56 y.o. M) Treating RN: [name] Primary Care Physician: [name] Other Clinician: Referring Physician: [name] Treating Physician/Extender: Tito Dine in Treatment: 5 Education Assessment Education Provided To: Patient Education Topics Provided Wound Debridement: Methods: Explain/Verbal Responses: State content correctly Electronic Signature(s) Signed: 12/06/2021 1:50:10 PM By: [name] RN Entered By: [name] on 12/06/2021 13:19:44 Ragon, [place] ([id number]) -------------------------------------------------------------------------------- Wound Assessment Details Patient Name: Wol[place]ki, [name] Date of Service: 12/06/2021 1:15 PM Medical Record Number: [id number] Patient Account Number: [id number] Date of Birth/Sex: [birth year] (56 y.o. M) Treating RN: [name] Primary Care Seba Madole: [name] Other Clinician: Referring Terrah Decoster: [name] Treating Panfilo Ketchum/Extender: Tito Dine in Treatment: 5 Wound Status Wound Number: 1 Primary Open Surgical Wound Etiology: Wound Location: Left Amputation Site - Digit Wound Open Wounding Event: Surgical  Injury Status: Date Acquired: 09/09/2021 Comorbid  Cataracts, Anemia, Sleep Apnea, Arrhythmia, Congestive Weeks Of Treatment: 2 History: Heart Failure, Coronary Artery Disease, Hypertension, Clustered Wound: No Myocardial Infarction, Type II Diabetes, Neuropathy, Seizure Disorder Photos Wound Measurements Length: (cm) 6.5 Width: (cm) 2.6 Depth: (cm) 2.5 Area: (cm) 13.273 Volume: (cm) 33.183 % Reduction in Area: 6.1% % Reduction in Volume: 19.1% Tunneling: No Undermining: No Wound Description Classification: Full Thickness With Exposed Support Structures Exudate Amount: Large Exudate Type: Serosanguineous Exudate Color: red, brown Foul Odor After Cleansing: No Slough/Fibrino Yes Wound Bed Granulation Amount: Medium (34-66%) Exposed Structure Granulation Quality: Red Fascia Exposed: Yes Necrotic Amount: Medium (34-66%) Fat Layer (Subcutaneous Tissue) Exposed: Yes Necrotic Quality: Adherent Slough Tendon Exposed: Yes Muscle Exposed: Yes Necrosis of Muscle: Yes Joint Exposed: No Bone Exposed: No Treatment Notes Wound #1 (Amputation Site - Digit) Wound Laterality: Left Cleanser Normal Saline Discharge Instruction: Wash your hands with soap and water. Remove old dressing, discard into plastic bag and place into trash. Cleanse the wound with Normal Saline prior to applying a clean dressing using gauze sponges, not tissues or cotton balls. Do not Rueger, Stuckey ([id number]) scrub or use excessive force. Pat dry using gauze sponges, not tissue or cotton balls. Vashe 5.8 (oz) Discharge Instruction: Use vashe 5.8 (oz) as directed Wound Cleanser Discharge Instruction: Wash your hands with soap and water. Remove old dressing, discard into plastic bag and place into trash. Cleanse the wound with Wound Cleanser prior to applying a clean dressing using gauze sponges, not tissues or cotton balls. Do not scrub or use excessive force. Pat dry using gauze sponges, not tissue or  cotton balls. Peri-Wound Care Topical Primary Dressing Gauze Discharge Instruction: VASHE/DAKINS-As directed: dry, moistened with saline or moistened with Dakins Solution Secondary Dressing ABD Pad 5x9 (in/in) Discharge Instruction: Cover with ABD pad Secured With 43M Medipore H Soft Cloth Surgical Tape, 2x2 (in/yd) Kerlix Roll Sterile or Non-Sterile 6-ply 4.5x4 (yd/yd) Discharge Instruction: Apply Kerlix as directed Compression Wrap Compression Stockings Add-Ons Electronic Signature(s) Signed: 12/06/2021 1:50:10 PM By: [name] RN Entered By: [name] on 12/06/2021 13:15:18 Wallner, [name] ([id number]) -------------------------------------------------------------------------------- Wound Assessment Details Patient Name: [place], [name] Date of Service: 12/06/2021 1:15 PM Medical Record Number: [id number] Patient Account Number: [id number] Date of Birth/Sex: Mar 13, 1966 (56 y.o. M) Treating RN: [name] Primary Care Mycheal Veldhu[place]: [name] Other Clinician: Referring Tijana Walder: [name] Treating Rodarius Kichline/Extender: Tito Dine in Treatment: 5 Wound Status Wound Number: 2 Primary Diabetic Wound/Ulcer of the Lower Extremity Etiology: Wound Location: Left, Lateral Foot Wound Open Wounding Event: Blister Status: Date Acquired: 10/28/2021 Comorbid Cataracts, Anemia, Sleep Apnea, Arrhythmia, Congestive Weeks Of Treatment: 1 History: Heart Failure, Coronary Artery Disease, Hypertension, Clustered Wound: No Myocardial Infarction, Type II Diabetes, Neuropathy, Seizure Disorder Photos Wound Measurements Length: (cm) 2 % Red Width: (cm) 2 % Red Depth: (cm) 0.1 Epith Area: (cm) 3.142 Tunn Volume: (cm) 0.314 Unde uction in Area: 0% uction in Volume: 0% elialization: None eling: No rmining: No Wound Description Classification: Unable to visualize wound bed Foul Exudate Amount: None Present Sloug Odor After Cleansing: No h/Fibrino Yes Wound  Bed Granulation Amount: None Present (0%) Exposed Structure Necrotic Amount: Large (67-100%) Fascia Exposed: No Necrotic Quality: Eschar Fat Layer (Subcutaneous Tissue) Exposed: No Tendon Exposed: No Muscle Exposed: No Joint Exposed: No Bone Exposed: No Treatment Notes Wound #2 (Foot) Wound Laterality: Left, Lateral Cleanser Peri-Wound Care Topical Betadine Bennion, [name] ([id number]) Discharge Instruction: paint wound bed, allow to dry Primary Dressing Secondary Dressing Kerlix 4.5 x 4.1 (in/yd)  Discharge Instruction: lightly Secured With Compression Wrap Compression Stockings Add-Ons Electronic Signature(s) Signed: 12/06/2021 1:50:10 PM By: [name] RN Entered By: [name] on 12/06/2021 13:15:42 Zajkowski, [name] ([id number]) -------------------------------------------------------------------------------- Vitals Details Patient Name: Balik, [name] Date of Service: 12/06/2021 1:15 PM Medical Record Number: [id number] Patient Account Number: [id number] Date of Birth/Sex: [birth year] (56 y.o. M) Treating RN: [name] Primary Care Jermany Rimel: [name] Other Clinician: Referring Terance Pomplun: [name] Treating Scarleth Brame/Extender: Tito Dine in Treatment: 5 Vital Signs Time Taken: 13:09 Temperature (F): 98.4 Height (in): 70 Pulse (bpm): 72 Weight (lbs): 180.4 Respiratory Rate (breaths/min): 16 Body Mass Index (BMI): 25.9 Blood Pressure (mmHg): 126/72 Reference Range: 80 - 120 mg / dl Electronic Signature(s) Signed: 12/06/2021 1:50:10 PM By: [name] RN Entered By: [name] on 12/06/2021 13:09:40

## 2021-12-06 NOTE — Progress Notes (Signed)
[place], [place] ([id number]) Visit Report for 12/06/2021 Arrival Information Details Patient Name: [name], [name]. Date of Service: 12/06/2021 10:30 AM Medical Record Number: [id number] Patient Account Number: [id number] Date of Birth/Sex: 1965-12-15 (56 y.o. M) Treating RN: Donnamarie Poag Primary Care Fenton Candee: [name] Other Clinician: Jacqulyn Bath Referring Makira Holleman: [name] Treating Matie Dimaano/Extender: Tito Dine in Treatment: 5 Visit Information History Since Last Visit Added or deleted any medications: No Patient Arrived: Ambulatory Any new allergies or adverse reactions: No Arrival Time: 10:40 Had a fall or experienced change in No Accompanied By: self activities of daily living that may affect Transfer Assistance: None risk of falls: Patient Identification Verified: Yes Signs or symptoms of abuse/neglect since last visito No Secondary Verification Process Completed: Yes Hospitalized since last visit: No Patient Requires Transmission-Based No Implantable device outside of the clinic excluding No Precautions: cellular tissue based products placed in the center Patient Has Alerts: Yes since last visit: Patient Alerts: Patient on Blood Has Dressing in Place as Prescribed: Yes Thinner Has Footwear/Offloading in Place as Prescribed: Yes DIALYSIS RIGHT ARM Left: Surgical Shoe with Pressure Aspirin 81/Brilinta Relief Insole Pain Present Now: No Electronic Signature(s) Signed: 12/06/2021 1:22:44 PM By: Enedina Finner RCP, RRT, CHT Entered By: Stark Jock, Amado Nash on 12/06/2021 10:54:17 Zenk, Ryon L. ([id number]) -------------------------------------------------------------------------------- Encounter Discharge Information Details Patient Name: [name], [name] Date of Service: 12/06/2021 10:30 AM Medical Record Number: [id number] Patient Account Number: [id number] Date of Birth/Sex: 18-Apr-1966 (56 y.o. M) Treating RN: Donnamarie Poag Primary Care Knight Oelkers: [name] Other Clinician: Jacqulyn Bath Referring Neilani Duffee: [name] Treating Segundo Makela/Extender: Tito Dine in Treatment: 5 Encounter Discharge Information Items Discharge Condition: Stable Ambulatory Status: Ambulatory Discharge Destination: Home Transportation: Private Auto Accompanied By: self Schedule Follow-up Appointment: Yes Clinical Summary of Care: Notes Patient has an HBO treatment scheduled on 12/09/21 at 10:30 am. Electronic Signature(s) Signed: 12/06/2021 1:22:44 PM By: Enedina Finner RCP, RRT, CHT Entered By: Stark Jock, Amado Nash on 12/06/2021 13:22:18 [place], [place] ([id number]) -------------------------------------------------------------------------------- Vitals Details Patient Name: [name], [name] Date of Service: 12/06/2021 10:30 AM Medical Record Number: [id number] Patient Account Number: [id number] Date of Birth/Sex: 07/30/1966 (56 y.o. M) Treating RN: Donnamarie Poag Primary Care Natahsa [name]: [name] Other Clinician: Jacqulyn Bath Referring Vicie Cech: [name] Treating Randall Colden/Extender: Tito Dine in Treatment: 5 Vital Signs Time Taken: 10:40 Temperature (F): 98.3 Height (in): 70 Pulse (bpm): 66 Weight (lbs): 180.4 Respiratory Rate (breaths/min): 16 Body Mass Index (BMI): 25.9 Blood Pressure (mmHg): 132/72 Capillary Blood Glucose (mg/dl): 164 Reference Range: 80 - 120 mg / dl Electronic Signature(s) Signed: 12/06/2021 1:22:44 PM By: Enedina Finner RCP, RRT, CHT Entered By: Enedina Finner on 12/06/2021 10:55:04

## 2021-12-09 ENCOUNTER — Other Ambulatory Visit: Payer: Self-pay

## 2021-12-09 ENCOUNTER — Encounter: Payer: Medicare Other | Admitting: Physician Assistant

## 2021-12-09 DIAGNOSIS — E11621 Type 2 diabetes mellitus with foot ulcer: Secondary | ICD-10-CM | POA: Diagnosis not present

## 2021-12-09 DIAGNOSIS — L97524 Non-pressure chronic ulcer of other part of left foot with necrosis of bone: Secondary | ICD-10-CM | POA: Diagnosis not present

## 2021-12-09 DIAGNOSIS — E1169 Type 2 diabetes mellitus with other specified complication: Secondary | ICD-10-CM | POA: Diagnosis not present

## 2021-12-09 DIAGNOSIS — M86672 Other chronic osteomyelitis, left ankle and foot: Secondary | ICD-10-CM | POA: Diagnosis not present

## 2021-12-09 DIAGNOSIS — I5042 Chronic combined systolic (congestive) and diastolic (congestive) heart failure: Secondary | ICD-10-CM | POA: Diagnosis not present

## 2021-12-09 DIAGNOSIS — N186 End stage renal disease: Secondary | ICD-10-CM | POA: Diagnosis not present

## 2021-12-09 DIAGNOSIS — I110 Hypertensive heart disease with heart failure: Secondary | ICD-10-CM | POA: Diagnosis not present

## 2021-12-09 DIAGNOSIS — Z5181 Encounter for therapeutic drug level monitoring: Secondary | ICD-10-CM | POA: Diagnosis not present

## 2021-12-09 DIAGNOSIS — D631 Anemia in chronic kidney disease: Secondary | ICD-10-CM | POA: Diagnosis not present

## 2021-12-09 DIAGNOSIS — N2581 Secondary hyperparathyroidism of renal origin: Secondary | ICD-10-CM | POA: Diagnosis not present

## 2021-12-09 DIAGNOSIS — Z992 Dependence on renal dialysis: Secondary | ICD-10-CM | POA: Diagnosis not present

## 2021-12-09 LAB — GLUCOSE, CAPILLARY
Glucose-Capillary: 117 mg/dL — ABNORMAL HIGH (ref 70–99)
Glucose-Capillary: 123 mg/dL — ABNORMAL HIGH (ref 70–99)
Glucose-Capillary: 128 mg/dL — ABNORMAL HIGH (ref 70–99)

## 2021-12-09 NOTE — Progress Notes (Addendum)
St[place], [place] ([id number]) Visit Report for 12/09/2021 Arrival Information Details Patient Name: [name], [name]. Date of Service: 12/09/2021 10:30 AM Medical Record Number: [id number] Patient Account Number: [id number] Date of Birth/Sex: 1966-08-18 (56 y.o. M) Treating RN: Donnamarie Poag Primary Care Maurita Havener: [name] Other Clinician: Jacqulyn Bath Referring Laronn Devonshire: [name] Treating Taim Wurm/Extender: [name] in Treatment: 5 Visit Information History Since Last Visit Added or deleted any medications: No Patient Arrived: Ambulatory Any new allergies or adverse reactions: No Arrival Time: 10:40 Had a fall or experienced change in No Accompanied By: self activities of daily living that may affect Transfer Assistance: None risk of falls: Patient Identification Verified: Yes Signs or symptoms of abuse/neglect since last visito No Secondary Verification Process Completed: Yes Hospitalized since last visit: No Patient Requires Transmission-Based No Implantable device outside of the clinic excluding No Precautions: cellular tissue based products placed in the center Patient Has Alerts: Yes since last visit: Patient Alerts: Patient on Blood Has Dressing in Place as Prescribed: Yes Thinner Has Footwear/Offloading in Place as Prescribed: Yes DIALYSIS RIGHT ARM Left: Surgical Shoe with Pressure Aspirin 81/Brilinta Relief Insole Pain Present Now: No Electronic Signature(s) Signed: 12/10/2021 12:49:51 PM By: Enedina Finner RCP, RRT, CHT Previous Signature: 12/09/2021 1:28:11 PM Version By: Enedina Finner RCP, RRT, CHT Entered By: Enedina Finner on 12/10/2021 11:37:55 Bialas, Lonie L. ([id number]) -------------------------------------------------------------------------------- Encounter Discharge Information Details Patient Name: [name], [name] Date of Service: 12/09/2021 10:30 AM Medical Record Number: [id number] Patient Account  Number: [id number] Date of Birth/Sex: Aug 12, 1966 (56 y.o. M) Treating RN: Donnamarie Poag Primary Care An Schnabel: [name] Other Clinician: Jacqulyn Bath Referring Arleigh Dicola: [name] Treating Coreen Shippee/Extender: [name] in Treatment: 5 Encounter Discharge Information Items Discharge Condition: Stable Ambulatory Status: Ambulatory Discharge Destination: Home Transportation: Private Auto Accompanied By: self Schedule Follow-up Appointment: Yes Clinical Summary of Care: Notes Patient has an HBO treatment scheduled on 12/10/21. Electronic Signature(s) Signed: 12/09/2021 1:28:11 PM By: Enedina Finner RCP, RRT, CHT Entered By: Enedina Finner on 12/09/2021 13:27:37 [name], [name] ([id number]) -------------------------------------------------------------------------------- Vitals Details Patient Name: [name], [name] Date of Service: 12/09/2021 10:30 AM Medical Record Number: [id number] Patient Account Number: [id number] Date of Birth/Sex: 1966/01/25 (56 y.o. M) Treating RN: Donnamarie Poag Primary Care Margert Edsall: [name] Other Clinician: Jacqulyn Bath Referring Donnel Venuto: [name] Treating Kale Rondeau/Extender: [name] in Treatment: 5 Vital Signs Time Taken: 10:40 Temperature (F): 98.5 Height (in): 70 Pulse (bpm): 66 Weight (lbs): 180.4 Respiratory Rate (breaths/min): 16 Body Mass Index (BMI): 25.9 Blood Pressure (mmHg): 130/72 Capillary Blood Glucose (mg/dl): 117 Reference Range: 80 - 120 mg / dl Electronic Signature(s) Signed: 12/09/2021 1:28:11 PM By: Enedina Finner RCP, RRT, CHT Entered By: Enedina Finner on 12/09/2021 11:05:25

## 2021-12-09 NOTE — Progress Notes (Signed)
[place], [place] ([id number]) Visit Report for 12/09/2021 HBO Details Patient Name: [name], [name]. Date of Service: 12/09/2021 10:30 AM Medical Record Number: [id number] Patient Account Number: [id number] Date of Birth/Sex: 21-Nov-1965 (56 y.o. M) Treating RN: Donnamarie Poag Primary Care Guiseppe Flanagan: [name] Other Clinician: Jacqulyn Bath Referring Ashraf Mesta: [name] Treating Sariya Trickey/Extender: [name] in Treatment: 5 HBO Treatment Course Details Treatment Course Number: 1 Ordering Khori Rosevear: Jeri Cos Total Treatments Ordered: 40 HBO Treatment Start Date: 11/04/2021 HBO Indication: Chronic Refractory Osteomyelitis to Left Foot HBO Treatment Details Treatment Number: 21 Patient Type: Outpatient Chamber Type: Monoplace Chamber Serial #: [id number] Treatment Protocol: 2.0 ATA with 90 minutes oxygen, and no air breaks Treatment Details Compression Rate Down: 1.0 psi / minute De-Compression Rate Up: 1.0 psi / minute Compress Tx Pressure Air breaks and breathing periods Decompress Decompress Begins Reached (leave unused spaces blank) Begins Ends Chamber Pressure (ATA) 1 2 - - - - - - 2 1 Clock Time (24 hr) 11:02 11:19 - - - - - - 12:49 13:03 Treatment Length: 121 (minutes) Treatment Segments: 4 Vital Signs Capillary Blood Glucose Reference Range: 80 - 120 mg / dl HBO Diabetic Blood Glucose Intervention Range: <131 mg/dl or >249 mg/dl Time Vitals Blood Respiratory Capillary Blood Glucose Pulse Action Type: Pulse: Temperature: Taken: Pressure: Rate: Glucose (mg/dl): Meter #: Oximetry (%) Taken: Pre 10:40 130/72 66 16 98.5 117 1 none per per PA-C Pre 10:58 138/68 66 16 98 123 1 none per PA Treatment Response Treatment Toleration: Well Treatment Completion Treatment Completed without Adverse Event Status: Electronic Signature(s) Signed: 12/09/2021 1:28:11 PM By: Zackery Barefoot, RRT, CHT Signed: 12/09/2021 5:14:22 PM By: Worthy Keeler PA-C Entered  By: Stark Jock, Amado Nash on 12/09/2021 13:26:45 Talton, Seraj L. ([id number]) -------------------------------------------------------------------------------- HBO Safety Checklist Details Patient Name: [name], [name] Date of Service: 12/09/2021 10:30 AM Medical Record Number: [id number] Patient Account Number: [id number] Date of Birth/Sex: 1966/08/05 (56 y.o. M) Treating RN: Donnamarie Poag Primary Care Curran Lenderman: [name] Other Clinician: Jacqulyn Bath Referring Daanya Lanphier: [name] Treating Legacy Carrender/Extender: [name] in Treatment: 5 HBO Safety Checklist Items Safety Checklist Consent Form Signed Patient voided / foley secured and emptied When did you last eato 1015 am Last dose of injectable or oral agent n/a Ostomy pouch emptied and vented if applicable NA All implantable devices assessed, documented and approved NA Intravenous access site secured and place NA Valuables secured Linens and cotton and cotton/polyester blend (less than 51% polyester) Personal oil-based products / skin lotions / body lotions removed Wigs or hairpieces removed NA Smoking or tobacco materials removed NA Books / newspapers / magazines / loose paper removed NA Cologne, aftershave, perfume and deodorant removed Jewelry removed (may wrap wedding band) Make-up removed NA Hair care products removed Battery operated devices (external) removed NA Heating patches and chemical warmers removed NA Titanium eyewear removed NA Nail polish cured greater than 10 hours NA Casting material cured greater than 10 hours NA Hearing aids removed NA Loose dentures or partials removed NA Prosthetics have been removed NA Patient demonstrates correct use of air break device (if applicable) Patient concerns have been addressed Patient grounding bracelet on and cord attached to chamber Specifics for Inpatients (complete in addition to above) Medication sheet sent with patient Intravenous  medications needed or due during therapy sent with patient Drainage tubes (e.g. nasogastric tube or chest tube secured and vented) Endotracheal or Tracheotomy tube secured Cuff deflated of air and inflated with saline Airway suctioned Electronic Signature(s) Signed:  12/09/2021 1:28:11 PM By: Enedina Finner RCP, RRT, CHT Entered By: Enedina Finner on 12/09/2021 11:08:34

## 2021-12-10 ENCOUNTER — Encounter: Payer: Medicare Other | Admitting: Physician Assistant

## 2021-12-10 DIAGNOSIS — L03116 Cellulitis of left lower limb: Secondary | ICD-10-CM | POA: Diagnosis not present

## 2021-12-10 DIAGNOSIS — I272 Pulmonary hypertension, unspecified: Secondary | ICD-10-CM | POA: Diagnosis not present

## 2021-12-10 DIAGNOSIS — M86172 Other acute osteomyelitis, left ankle and foot: Secondary | ICD-10-CM | POA: Diagnosis not present

## 2021-12-10 DIAGNOSIS — L97524 Non-pressure chronic ulcer of other part of left foot with necrosis of bone: Secondary | ICD-10-CM | POA: Diagnosis not present

## 2021-12-10 DIAGNOSIS — E782 Mixed hyperlipidemia: Secondary | ICD-10-CM | POA: Diagnosis not present

## 2021-12-10 DIAGNOSIS — I10 Essential (primary) hypertension: Secondary | ICD-10-CM | POA: Diagnosis not present

## 2021-12-10 DIAGNOSIS — E1169 Type 2 diabetes mellitus with other specified complication: Secondary | ICD-10-CM | POA: Diagnosis not present

## 2021-12-10 DIAGNOSIS — I2510 Atherosclerotic heart disease of native coronary artery without angina pectoris: Secondary | ICD-10-CM | POA: Diagnosis not present

## 2021-12-10 DIAGNOSIS — M86672 Other chronic osteomyelitis, left ankle and foot: Secondary | ICD-10-CM | POA: Diagnosis not present

## 2021-12-10 DIAGNOSIS — I25118 Atherosclerotic heart disease of native coronary artery with other forms of angina pectoris: Secondary | ICD-10-CM | POA: Diagnosis not present

## 2021-12-10 DIAGNOSIS — E1151 Type 2 diabetes mellitus with diabetic peripheral angiopathy without gangrene: Secondary | ICD-10-CM | POA: Diagnosis not present

## 2021-12-10 DIAGNOSIS — I739 Peripheral vascular disease, unspecified: Secondary | ICD-10-CM | POA: Diagnosis not present

## 2021-12-10 DIAGNOSIS — E11621 Type 2 diabetes mellitus with foot ulcer: Secondary | ICD-10-CM | POA: Diagnosis not present

## 2021-12-10 DIAGNOSIS — B957 Other staphylococcus as the cause of diseases classified elsewhere: Secondary | ICD-10-CM | POA: Diagnosis not present

## 2021-12-10 DIAGNOSIS — B952 Enterococcus as the cause of diseases classified elsewhere: Secondary | ICD-10-CM | POA: Diagnosis not present

## 2021-12-10 DIAGNOSIS — I110 Hypertensive heart disease with heart failure: Secondary | ICD-10-CM | POA: Diagnosis not present

## 2021-12-10 DIAGNOSIS — Z4781 Encounter for orthopedic aftercare following surgical amputation: Secondary | ICD-10-CM | POA: Diagnosis not present

## 2021-12-10 DIAGNOSIS — I5042 Chronic combined systolic (congestive) and diastolic (congestive) heart failure: Secondary | ICD-10-CM | POA: Diagnosis not present

## 2021-12-10 DIAGNOSIS — Z789 Other specified health status: Secondary | ICD-10-CM | POA: Diagnosis not present

## 2021-12-10 LAB — GLUCOSE, CAPILLARY
Glucose-Capillary: 180 mg/dL — ABNORMAL HIGH (ref 70–99)
Glucose-Capillary: 136 mg/dL — ABNORMAL HIGH (ref 70–99)

## 2021-12-10 NOTE — Progress Notes (Signed)
Re[place], [place] ([id number]) Visit Report for 12/10/2021 Arrival Information Details Patient Name: [name], [name]. Date of Service: 12/10/2021 9:00 AM Medical Record Number: [id number] Patient Account Number: [id number] Date of Birth/Sex: [birth year] (56 y.o. M) Treating RN: Donnamarie Poag Primary Care Elonda Giuliano: [name] Other Clinician: Jacqulyn Bath Referring Brevon Dewald: [name] Treating Mehkai Gallo/Extender: [name] in Treatment: 5 Visit Information History Since Last Visit Added or deleted any medications: No Patient Arrived: Ambulatory Any new allergies or adverse reactions: No Arrival Time: 11:20 Had a fall or experienced change in No Accompanied By: self activities of daily living that may affect Transfer Assistance: None risk of falls: Patient Identification Verified: Yes Signs or symptoms of abuse/neglect since last visito No Secondary Verification Process Completed: Yes Hospitalized since last visit: No Patient Requires Transmission-Based No Has Dressing in Place as Prescribed: Yes Precautions: Has Footwear/Offloading in Place as Prescribed: Yes Patient Has Alerts: Yes Left: Surgical Shoe with Pressure Patient Alerts: Patient on Blood Relief Insole Thinner Pain Present Now: No DIALYSIS RIGHT ARM Aspirin 81/Brilinta Electronic Signature(s) Signed: 12/10/2021 1:55:24 PM By: Enedina Finner RCP, RRT, CHT Entered By: Stark Jock, Amado Nash on 12/10/2021 11:38:35 Deshong, Montrelle L. ([id number]) -------------------------------------------------------------------------------- Encounter Discharge Information Details Patient Name: [name], [name] Date of Service: 12/10/2021 9:00 AM Medical Record Number: [id number] Patient Account Number: [id number] Date of Birth/Sex: 08-Jan-1966 (56 y.o. M) Treating RN: Donnamarie Poag Primary Care Jahbari Repinski: [name] Other Clinician: Jacqulyn Bath Referring Khamani Fairley: [name] Treating Dominick Morella/Extender:  [name] in Treatment: 5 Encounter Discharge Information Items Discharge Condition: Stable Ambulatory Status: Ambulatory Discharge Destination: Home Transportation: Private Auto Accompanied By: self Schedule Follow-up Appointment: Yes Clinical Summary of Care: Notes Patient has an HBO treatment scheduled on 12/11/21 at 10:30 am. Electronic Signature(s) Signed: 12/10/2021 1:55:24 PM By: Enedina Finner RCP, RRT, CHT Entered By: Enedina Finner on 12/10/2021 13:54:53 Hofstra, Denario L. ([id number]) -------------------------------------------------------------------------------- Vitals Details Patient Name: [name], [name] Date of Service: 12/10/2021 9:00 AM Medical Record Number: [id number] Patient Account Number: [id number] Date of Birth/Sex: June 01, 1966 (56 y.o. M) Treating RN: Donnamarie Poag Primary Care Hebert Dooling: [name] Other Clinician: Jacqulyn Bath Referring Rithika Seel: [name] Treating Imogean Ciampa/Extender: [name] in Treatment: 5 Vital Signs Time Taken: 11:22 Temperature (F): 98.6 Height (in): 70 Pulse (bpm): 84 Weight (lbs): 180.4 Respiratory Rate (breaths/min): 16 Body Mass Index (BMI): 25.9 Blood Pressure (mmHg): 136/74 Capillary Blood Glucose (mg/dl): 180 Reference Range: 80 - 120 mg / dl Electronic Signature(s) Signed: 12/10/2021 1:55:24 PM By: Enedina Finner RCP, RRT, CHT Entered By: Enedina Finner on 12/10/2021 11:41:18

## 2021-12-10 NOTE — Progress Notes (Signed)
[name] ([id number]) Visit Report for 12/10/2021 HBO Details Patient Name: [name], [name]. Date of Service: 12/10/2021 9:00 AM Medical Record Number: [id number] Patient Account Number: [id number] Date of Birth/Sex: [birth year] (56 y.o. M) Treating RN: Donnamarie Poag Primary Care Brendy Ficek: [name] Other Clinician: Jacqulyn Bath Referring Nitya Cauthon: [name] Treating Amarah Brossman/Extender: [name] in Treatment: 5 HBO Treatment Course Details Treatment Course Number: 1 Ordering Sadie Pickar: Jeri Cos Total Treatments Ordered: 40 HBO Treatment Start Date: 11/04/2021 HBO Indication: Chronic Refractory Osteomyelitis to Left Foot HBO Treatment Details Treatment Number: 22 Patient Type: Outpatient Chamber Type: Monoplace Chamber Serial #: [id number] Treatment Protocol: 2.0 ATA with 90 minutes oxygen, and no air breaks Treatment Details Compression Rate Down: 1.0 psi / minute De-Compression Rate Up: 1.5 psi / minute Compress Tx Pressure Air breaks and breathing periods Decompress Decompress Begins Reached (leave unused spaces blank) Begins Ends Chamber Pressure (ATA) 1 2 - - - - - - 2 1 Clock Time (24 hr) 11:34 11:51 - - - - - - 13:21 13:31 Treatment Length: 117 (minutes) Treatment Segments: 4 Vital Signs Capillary Blood Glucose Reference Range: 80 - 120 mg / dl HBO Diabetic Blood Glucose Intervention Range: <131 mg/dl or >249 mg/dl Time Vitals Blood Respiratory Capillary Blood Glucose Pulse Action Type: Pulse: Temperature: Taken: Pressure: Rate: Glucose (mg/dl): Meter #: Oximetry (%) Taken: Pre 11:22 136/74 84 16 98.6 180 1 none per protocol Post 13:36 138/72 72 16 98.5 136 1 none per protocol Treatment Response Treatment Toleration: Well Treatment Completion Treatment Completed without Adverse Event Status: Electronic Signature(s) Signed: 12/10/2021 1:55:24 PM By: Zackery Barefoot, RRT, CHT Signed: 12/10/2021 4:50:19 PM By: Worthy Keeler  PA-C Entered By: Stark Jock, Amado Nash on 12/10/2021 13:53:49 Okazaki, Merlin ([id number]) -------------------------------------------------------------------------------- HBO Safety Checklist Details Patient Name: [name], [name] Date of Service: 12/10/2021 9:00 AM Medical Record Number: [id number] Patient Account Number: [id number] Date of Birth/Sex: [birth year] (56 y.o. M) Treating RN: Donnamarie Poag Primary Care Darrin Koman: [name] Other Clinician: Jacqulyn Bath Referring Cate Oravec: [name] Treating Makayela Secrest/Extender: [name] in Treatment: 5 HBO Safety Checklist Items Safety Checklist Consent Form Signed Patient voided / foley secured and emptied When did you last eato 11:00 am Last dose of injectable or oral agent n/a Ostomy pouch emptied and vented if applicable NA All implantable devices assessed, documented and approved NA Intravenous access site secured and place NA Valuables secured Linens and cotton and cotton/polyester blend (less than 51% polyester) Personal oil-based products / skin lotions / body lotions removed Wigs or hairpieces removed NA Smoking or tobacco materials removed NA Books / newspapers / magazines / loose paper removed NA Cologne, aftershave, perfume and deodorant removed Jewelry removed (may wrap wedding band) Make-up removed NA Hair care products removed Battery operated devices (external) removed NA Heating patches and chemical warmers removed NA Titanium eyewear removed NA Nail polish cured greater than 10 hours NA Casting material cured greater than 10 hours NA Hearing aids removed NA Loose dentures or partials removed NA Prosthetics have been removed NA Patient demonstrates correct use of air break device (if applicable) Patient concerns have been addressed Patient grounding bracelet on and cord attached to chamber Specifics for Inpatients (complete in addition to above) Medication sheet sent with  patient Intravenous medications needed or due during therapy sent with patient Drainage tubes (e.g. nasogastric tube or chest tube secured and vented) Endotracheal or Tracheotomy tube secured Cuff deflated of air and inflated with saline Airway suctioned Electronic Signature(s) Signed: 12/10/2021  1:55:24 PM By: Enedina Finner RCP, RRT, CHT Entered By: Enedina Finner on 12/10/2021 11:42:56

## 2021-12-11 ENCOUNTER — Other Ambulatory Visit: Payer: Self-pay

## 2021-12-11 ENCOUNTER — Encounter (HOSPITAL_BASED_OUTPATIENT_CLINIC_OR_DEPARTMENT_OTHER): Payer: Medicare Other | Admitting: Internal Medicine

## 2021-12-11 DIAGNOSIS — M86672 Other chronic osteomyelitis, left ankle and foot: Secondary | ICD-10-CM

## 2021-12-11 DIAGNOSIS — E11621 Type 2 diabetes mellitus with foot ulcer: Secondary | ICD-10-CM | POA: Diagnosis not present

## 2021-12-11 DIAGNOSIS — I5042 Chronic combined systolic (congestive) and diastolic (congestive) heart failure: Secondary | ICD-10-CM | POA: Diagnosis not present

## 2021-12-11 DIAGNOSIS — E1169 Type 2 diabetes mellitus with other specified complication: Secondary | ICD-10-CM | POA: Diagnosis not present

## 2021-12-11 DIAGNOSIS — N186 End stage renal disease: Secondary | ICD-10-CM | POA: Diagnosis not present

## 2021-12-11 DIAGNOSIS — N2581 Secondary hyperparathyroidism of renal origin: Secondary | ICD-10-CM | POA: Diagnosis not present

## 2021-12-11 DIAGNOSIS — L97524 Non-pressure chronic ulcer of other part of left foot with necrosis of bone: Secondary | ICD-10-CM

## 2021-12-11 DIAGNOSIS — I110 Hypertensive heart disease with heart failure: Secondary | ICD-10-CM | POA: Diagnosis not present

## 2021-12-11 DIAGNOSIS — Z992 Dependence on renal dialysis: Secondary | ICD-10-CM | POA: Diagnosis not present

## 2021-12-11 DIAGNOSIS — D631 Anemia in chronic kidney disease: Secondary | ICD-10-CM | POA: Diagnosis not present

## 2021-12-11 LAB — GLUCOSE, CAPILLARY
Glucose-Capillary: 228 mg/dL — ABNORMAL HIGH (ref 70–99)
Glucose-Capillary: 143 mg/dL — ABNORMAL HIGH (ref 70–99)

## 2021-12-11 NOTE — Progress Notes (Signed)
[place]er, [place] ([id number]) Visit Report for 12/11/2021 HBO Details Patient Name: [name], [name]. Date of Service: 12/11/2021 10:30 AM Medical Record Number: [id number] Patient Account Number: [id number] Date of Birth/Sex: 12/22/1965 (56 y.o. M) Treating RN: Donnamarie Poag Primary Care Ara Grandmaison: [name] Other Clinician: Jacqulyn Bath Referring Daqwan Dougal: [name] Treating Taurus Alamo/Extender: Yaakov Guthrie in Treatment: 5 HBO Treatment Course Details Treatment Course Number: 1 Ordering Natassia Guthridge: Jeri Cos Total Treatments Ordered: 40 HBO Treatment Start Date: 11/04/2021 HBO Indication: Chronic Refractory Osteomyelitis to Left Foot HBO Treatment Details Treatment Number: 23 Patient Type: Outpatient Chamber Type: Monoplace Chamber Serial #: [id number] Treatment Protocol: 2.0 ATA with 90 minutes oxygen, and no air breaks Treatment Details Compression Rate Down: 1.0 psi / minute De-Compression Rate Up: 1.5 psi / minute Compress Tx Pressure Air breaks and breathing periods Decompress Decompress Begins Reached (leave unused spaces blank) Begins Ends Chamber Pressure (ATA) 1 2 - - - - - - 2 1 Clock Time (24 hr) 11:01 11:19 - - - - - - 12:49 12:59 Treatment Length: 118 (minutes) Treatment Segments: 4 Vital Signs Capillary Blood Glucose Reference Range: 80 - 120 mg / dl HBO Diabetic Blood Glucose Intervention Range: <131 mg/dl or >249 mg/dl Time Vitals Blood Respiratory Capillary Blood Glucose Pulse Action Type: Pulse: Temperature: Taken: Pressure: Rate: Glucose (mg/dl): Meter #: Oximetry (%) Taken: Pre 10:44 132/70 78 16 98.4 228 1 none per protocol Post 13:04 132/78 72 16 98 143 1 none per protocol Treatment Response Treatment Toleration: Well Treatment Completion Treatment Completed without Adverse Event Status: HBO Attestation I certify that I supervised this HBO treatment in accordance with Medicare guidelines. A trained emergency response team is readily  Yes available per hospital policies and procedures. Continue HBOT as ordered. Yes Electronic Signature(s) Signed: 12/11/2021 3:58:09 PM By: Kalman Shan DO Previous Signature: 12/11/2021 1:35:07 PM Version By: Enedina Finner RCP, RRT, CHT Entered By: Kalman Shan on 12/11/2021 15:34:20 [name], [name] ([id number]) -------------------------------------------------------------------------------- HBO Safety Checklist Details Patient Name: [name], [name] Date of Service: 12/11/2021 10:30 AM Medical Record Number: [id number] Patient Account Number: [id number] Date of Birth/Sex: 06-11-1966 (56 y.o. M) Treating RN: Donnamarie Poag Primary Care Ernestine Langworthy: [name] Other Clinician: Jacqulyn Bath Referring Athol Bolds: [name] Treating Evalene Vath/Extender: Yaakov Guthrie in Treatment: 5 HBO Safety Checklist Items Safety Checklist Consent Form Signed Patient voided / foley secured and emptied When did you last eato 10:00 am Last dose of injectable or oral agent n/a Ostomy pouch emptied and vented if applicable NA All implantable devices assessed, documented and approved NA Intravenous access site secured and place NA Valuables secured Linens and cotton and cotton/polyester blend (less than 51% polyester) Personal oil-based products / skin lotions / body lotions removed Wigs or hairpieces removed NA Smoking or tobacco materials removed NA Books / newspapers / magazines / loose paper removed NA Cologne, aftershave, perfume and deodorant removed Jewelry removed (may wrap wedding band) Make-up removed NA Hair care products removed Battery operated devices (external) removed NA Heating patches and chemical warmers removed NA Titanium eyewear removed NA Nail polish cured greater than 10 hours NA Casting material cured greater than 10 hours NA Hearing aids removed NA Loose dentures or partials removed NA Prosthetics have been removed NA Patient  demonstrates correct use of air break device (if applicable) Patient concerns have been addressed Patient grounding bracelet on and cord attached to chamber Specifics for Inpatients (complete in addition to above) Medication sheet sent with patient Intravenous medications needed or due during  therapy sent with patient Drainage tubes (e.g. nasogastric tube or chest tube secured and vented) Endotracheal or Tracheotomy tube secured Cuff deflated of air and inflated with saline Airway suctioned Electronic Signature(s) Signed: 12/11/2021 1:35:07 PM By: Enedina Finner RCP, RRT, CHT Entered By: Enedina Finner on 12/11/2021 11:08:47

## 2021-12-11 NOTE — Progress Notes (Signed)
Wogan, [place] ([id number]) Visit Report for 12/11/2021 Arrival Information Details Patient Name: [name], [name]. Date of Service: 12/11/2021 10:30 AM Medical Record Number: [id number] Patient Account Number: [id number] Date of Birth/Sex: [birth year] (56 y.o. M) Treating RN: Donnamarie Poag Primary Care Kyleigh Nannini: [name] Other Clinician: Jacqulyn Bath Referring Adelae Yodice: [name] Treating Barbara Keng/Extender: Yaakov Guthrie in Treatment: 5 Visit Information History Since Last Visit Added or deleted any medications: No Patient Arrived: Ambulatory Any new allergies or adverse reactions: No Arrival Time: 11:40 Had a fall or experienced change in No Accompanied By: self activities of daily living that may affect Transfer Assistance: None risk of falls: Patient Identification Verified: Yes Signs or symptoms of abuse/neglect since last visito No Secondary Verification Process Completed: Yes Hospitalized since last visit: No Patient Requires Transmission-Based No Implantable device outside of the clinic excluding No Precautions: cellular tissue based products placed in the center Patient Has Alerts: Yes since last visit: Patient Alerts: Patient on Blood Has Dressing in Place as Prescribed: Yes Thinner Has Footwear/Offloading in Place as Prescribed: Yes DIALYSIS RIGHT ARM Left: Surgical Shoe with Pressure Aspirin 81/Brilinta Relief Insole Pain Present Now: No Electronic Signature(s) Signed: 12/11/2021 1:35:07 PM By: Enedina Finner RCP, RRT, CHT Entered By: Stark Jock, Amado Nash on 12/11/2021 11:04:52 [name], [name] ([id number]) -------------------------------------------------------------------------------- Encounter Discharge Information Details Patient Name: [name], [name] Date of Service: 12/11/2021 10:30 AM Medical Record Number: [id number] Patient Account Number: [id number] Date of Birth/Sex: [birth year] (56 y.o. M) Treating RN: Donnamarie Poag Primary  Care Jakia Kennebrew: [name] Other Clinician: Jacqulyn Bath Referring Tajay Muzzy: [name] Treating Lutie Pickler/Extender: Yaakov Guthrie in Treatment: 5 Encounter Discharge Information Items Discharge Condition: Stable Ambulatory Status: Ambulatory Discharge Destination: Home Transportation: Private Auto Accompanied By: self Schedule Follow-up Appointment: Yes Clinical Summary of Care: Notes Patient has an HBO treatment scheduled on 12/12/21 at 09:00 am. Electronic Signature(s) Signed: 12/11/2021 1:35:07 PM By: Enedina Finner RCP, RRT, CHT Entered By: Enedina Finner on 12/11/2021 13:15:15 [name], [name] ([id number]) -------------------------------------------------------------------------------- Vitals Details Patient Name: [name], [name] Date of Service: 12/11/2021 10:30 AM Medical Record Number: [id number] Patient Account Number: [id number] Date of Birth/Sex: [birth year] (56 y.o. M) Treating RN: Donnamarie Poag Primary Care Samul Mcinroy: [name] Other Clinician: Jacqulyn Bath Referring Liller Yohn: [name] Treating Taylynn Easton/Extender: Yaakov Guthrie in Treatment: 5 Vital Signs Time Taken: 10:44 Temperature (F): 98.4 Height (in): 70 Pulse (bpm): 78 Weight (lbs): 180.4 Respiratory Rate (breaths/min): 16 Body Mass Index (BMI): 25.9 Blood Pressure (mmHg): 132/70 Capillary Blood Glucose (mg/dl): 228 Reference Range: 80 - 120 mg / dl Electronic Signature(s) Signed: 12/11/2021 1:35:07 PM By: Enedina Finner RCP, RRT, CHT Entered By: Enedina Finner on 12/11/2021 11:05:32

## 2021-12-12 ENCOUNTER — Encounter: Payer: Medicare Other | Admitting: Physician Assistant

## 2021-12-12 DIAGNOSIS — I110 Hypertensive heart disease with heart failure: Secondary | ICD-10-CM | POA: Diagnosis not present

## 2021-12-12 DIAGNOSIS — E11621 Type 2 diabetes mellitus with foot ulcer: Secondary | ICD-10-CM | POA: Diagnosis not present

## 2021-12-12 DIAGNOSIS — L97524 Non-pressure chronic ulcer of other part of left foot with necrosis of bone: Secondary | ICD-10-CM | POA: Diagnosis not present

## 2021-12-12 DIAGNOSIS — E1169 Type 2 diabetes mellitus with other specified complication: Secondary | ICD-10-CM | POA: Diagnosis not present

## 2021-12-12 DIAGNOSIS — M86672 Other chronic osteomyelitis, left ankle and foot: Secondary | ICD-10-CM | POA: Diagnosis not present

## 2021-12-12 DIAGNOSIS — I5042 Chronic combined systolic (congestive) and diastolic (congestive) heart failure: Secondary | ICD-10-CM | POA: Diagnosis not present

## 2021-12-12 LAB — GLUCOSE, CAPILLARY
Glucose-Capillary: 205 mg/dL — ABNORMAL HIGH (ref 70–99)
Glucose-Capillary: 117 mg/dL — ABNORMAL HIGH (ref 70–99)

## 2021-12-12 NOTE — Progress Notes (Signed)
Hearne, Fonda ([id number]) Visit Report for 12/12/2021 Arrival Information Details Patient Name: [name], [name]. Date of Service: 12/12/2021 9:00 AM Medical Record Number: [id number] Patient Account Number: [id number] Date of Birth/Sex: 1966-03-23 (56 y.o. M) Treating RN: Donnamarie Poag Primary Care Yanitza Shvartsman: [name] Other Clinician: Jacqulyn Bath Referring Dorion Petillo: [name] Treating Mohamadou Maciver/Extender: [name] in Treatment: 6 Visit Information History Since Last Visit Added or deleted any medications: No Patient Arrived: Ambulatory Any new allergies or adverse reactions: No Arrival Time: 08:05 Had a fall or experienced change in No Accompanied By: self activities of daily living that may affect Transfer Assistance: None risk of falls: Patient Identification Verified: Yes Signs or symptoms of abuse/neglect since last visito No Secondary Verification Process Completed: Yes Hospitalized since last visit: No Patient Requires Transmission-Based No Implantable device outside of the clinic excluding No Precautions: cellular tissue based products placed in the center Patient Has Alerts: Yes since last visit: Patient Alerts: Patient on Blood Has Dressing in Place as Prescribed: Yes Thinner Has Footwear/Offloading in Place as Prescribed: Yes DIALYSIS RIGHT ARM Left: Surgical Shoe with Pressure Aspirin 81/Brilinta Relief Insole Pain Present Now: No Electronic Signature(s) Signed: 12/12/2021 2:18:47 PM By: Enedina Finner RCP, RRT, CHT Entered By: Stark Jock, Amado Nash on 12/12/2021 08:31:52 [name], [name] ([id number]) -------------------------------------------------------------------------------- Encounter Discharge Information Details Patient Name: [name], [name] Date of Service: 12/12/2021 9:00 AM Medical Record Number: [id number] Patient Account Number: [id number] Date of Birth/Sex: 15-Jul-1966 (56 y.o. M) Treating RN: Donnamarie Poag Primary Care  Kiyomi Pallo: [name] Other Clinician: Jacqulyn Bath Referring Korinne Greenstein: [name] Treating Denisa Enterline/Extender: [name] in Treatment: 6 Encounter Discharge Information Items Discharge Condition: Stable Ambulatory Status: Ambulatory Discharge Destination: Home Transportation: Private Auto Accompanied By: self Schedule Follow-up Appointment: Yes Clinical Summary of Care: Notes Patient has an HBO treatment scheduled on 12/13/21 at 10:30 am. Electronic Signature(s) Signed: 12/12/2021 2:18:47 PM By: Enedina Finner RCP, RRT, CHT Entered By: Stark Jock, Amado Nash on 12/12/2021 10:50:05 [name], [name] ([id number]) -------------------------------------------------------------------------------- Vitals Details Patient Name: [name], [name] Date of Service: 12/12/2021 9:00 AM Medical Record Number: [id number] Patient Account Number: [id number] Date of Birth/Sex: [birth year] (56 y.o. M) Treating RN: Donnamarie Poag Primary Care Latravia Southgate: [name] Other Clinician: Jacqulyn Bath Referring Chima Astorino: [name] Treating Arielys Wandersee/Extender: [name] in Treatment: 6 Vital Signs Time Taken: 08:09 Temperature (F): 98.7 Height (in): 70 Pulse (bpm): 72 Weight (lbs): 180.4 Respiratory Rate (breaths/min): 16 Body Mass Index (BMI): 25.9 Blood Pressure (mmHg): 132/64 Capillary Blood Glucose (mg/dl): 205 Reference Range: 80 - 120 mg / dl Electronic Signature(s) Signed: 12/12/2021 2:18:47 PM By: Enedina Finner RCP, RRT, CHT Entered By: Enedina Finner on 12/12/2021 08:36:12

## 2021-12-13 ENCOUNTER — Other Ambulatory Visit: Payer: Self-pay

## 2021-12-13 ENCOUNTER — Encounter: Payer: Medicare Other | Admitting: Physician Assistant

## 2021-12-13 DIAGNOSIS — T8189XA Other complications of procedures, not elsewhere classified, initial encounter: Secondary | ICD-10-CM | POA: Diagnosis not present

## 2021-12-13 DIAGNOSIS — L97525 Non-pressure chronic ulcer of other part of left foot with muscle involvement without evidence of necrosis: Secondary | ICD-10-CM | POA: Diagnosis not present

## 2021-12-13 DIAGNOSIS — N2581 Secondary hyperparathyroidism of renal origin: Secondary | ICD-10-CM | POA: Diagnosis not present

## 2021-12-13 DIAGNOSIS — I110 Hypertensive heart disease with heart failure: Secondary | ICD-10-CM | POA: Diagnosis not present

## 2021-12-13 DIAGNOSIS — E1169 Type 2 diabetes mellitus with other specified complication: Secondary | ICD-10-CM | POA: Diagnosis not present

## 2021-12-13 DIAGNOSIS — I5042 Chronic combined systolic (congestive) and diastolic (congestive) heart failure: Secondary | ICD-10-CM | POA: Diagnosis not present

## 2021-12-13 DIAGNOSIS — N186 End stage renal disease: Secondary | ICD-10-CM | POA: Diagnosis not present

## 2021-12-13 DIAGNOSIS — L97524 Non-pressure chronic ulcer of other part of left foot with necrosis of bone: Secondary | ICD-10-CM | POA: Diagnosis not present

## 2021-12-13 DIAGNOSIS — Z992 Dependence on renal dialysis: Secondary | ICD-10-CM | POA: Diagnosis not present

## 2021-12-13 DIAGNOSIS — D631 Anemia in chronic kidney disease: Secondary | ICD-10-CM | POA: Diagnosis not present

## 2021-12-13 DIAGNOSIS — M86672 Other chronic osteomyelitis, left ankle and foot: Secondary | ICD-10-CM | POA: Diagnosis not present

## 2021-12-13 DIAGNOSIS — E11621 Type 2 diabetes mellitus with foot ulcer: Secondary | ICD-10-CM | POA: Diagnosis not present

## 2021-12-13 LAB — GLUCOSE, CAPILLARY
Glucose-Capillary: 233 mg/dL — ABNORMAL HIGH (ref 70–99)
Glucose-Capillary: 111 mg/dL — ABNORMAL HIGH (ref 70–99)

## 2021-12-13 NOTE — Progress Notes (Signed)
[name], [name] ([id number]) Visit Report for 12/12/2021 HBO Details Patient Name: [name], [name]. Date of Service: 12/12/2021 9:00 AM Medical Record Number: [id number] Patient Account Number: [id number] Date of Birth/Sex: [birth year] (56 y.o. 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 HBO Treatment Details Treatment Number: 24 Patient Type: Outpatient Chamber Type: Monoplace Chamber Serial #: [id number] Treatment Protocol: 2.0 ATA with 90 minutes oxygen, and no air breaks Treatment Details Compression Rate Down: 1.0 psi / minute De-Compression Rate Up: 1.0 psi / minute Compress Tx Pressure Air breaks and breathing periods Decompress Decompress Begins Reached (leave unused spaces blank) Begins Ends Chamber Pressure (ATA) 1 2 - - - - - - 2 1 Clock Time (24 hr) 08:23 08:40 - - - - - - 10:10 10:24 Treatment Length: 121 (minutes) Treatment Segments: 4 Vital Signs Capillary Blood Glucose Reference Range: 80 - 120 mg / dl HBO Diabetic Blood Glucose Intervention Range: <131 mg/dl or >249 mg/dl Time Vitals Blood Respiratory Capillary Blood Glucose Pulse Action Type: Pulse: Temperature: Taken: Pressure: Rate: Glucose (mg/dl): Meter #: Oximetry (%) Taken: Pre 08:23 132/64 72 16 98.7 205 1 none per protocol Post 10:30 132/80 78 16 97.9 117 1 none per protocol Treatment Response Treatment Toleration: Well Treatment Completion Treatment Completed without Adverse Event Status: Electronic Signature(s) Signed: 12/12/2021 2:18:47 PM By: Zackery Barefoot, RRT, CHT Signed: 12/13/2021 4:05:24 PM By: Worthy Keeler  PA-C Entered By: Stark Jock, Amado Nash on 12/12/2021 10:48:49 Ernest, Angelus L. ([id number]) -------------------------------------------------------------------------------- HBO Safety Checklist Details Patient Name: [name], [name] Date of Service: 12/12/2021 9:00 AM Medical Record Number: [id number] Patient Account Number: [id number] Date of Birth/Sex: [birth year] (56 y.o. M) Treating RN: Donnamarie Poag Primary Care Aleen Marston: [name] Other Clinician: Jacqulyn Bath Referring Devynn Hessler: [name] Treating Pasco Marchitto/Extender: [name] in Treatment: 6 HBO Safety Checklist Items Safety Checklist Consent Form Signed Patient voided / foley secured and emptied When did you last eato 07:00 am Last dose of injectable or oral agent n/a Ostomy pouch emptied and vented if applicable NA All implantable devices assessed, documented and approved NA Intravenous access site secured and place NA Valuables secured Linens and cotton and cotton/polyester blend (less than 51% polyester) Personal oil-based products / skin lotions / body lotions removed Wigs or hairpieces removed NA Smoking or tobacco materials removed NA Books / newspapers / magazines / loose paper removed NA Cologne, aftershave, perfume and deodorant removed Jewelry removed (may wrap wedding band) Make-up removed NA Hair care products removed Battery operated devices (external) removed NA Heating patches and chemical warmers removed NA Titanium eyewear removed NA Nail polish cured greater than 10 hours NA Casting material cured greater than 10 hours NA Hearing aids removed NA Loose dentures or partials removed NA Prosthetics have been removed NA Patient demonstrates correct use of air break device (if applicable) Patient concerns have been addressed Patient grounding bracelet on and cord attached to chamber Specifics for Inpatients (complete in addition to above) Medication sheet sent with  patient Intravenous medications needed or due during therapy sent with patient Drainage tubes (e.g. nasogastric tube or chest tube secured and vented) Endotracheal or Tracheotomy tube secured Cuff deflated of air and inflated with saline Airway suctioned Electronic Signature(s) Signed: 12/12/2021  2:18:47 PM By: Enedina Finner RCP, RRT, CHT Entered By: Enedina Finner on 12/12/2021 08:34:48

## 2021-12-13 NOTE — Progress Notes (Addendum)
[name], [name] ([id number]) Visit Report for 12/13/2021 Chief Complaint Document Details Patient Name: [name], [name]. Date of Service: 12/13/2021 1:15 PM Medical Record Number: [id number] Patient Account Number: [id number] Date of Birth/Sex: 10-24-1965 (56 y.o. M) Treating RN: [name] Primary Care Provider: [name] Other Clinician: Referring Provider: [name] Treating Provider/Extender: [name] in Treatment: 6 Information Obtained from: Patient Chief Complaint HBO evaluation for treatment on the left foot diabetic ulcer with osteomyelitis. Patient is currently under our care as well for his foot. Electronic Signature(s) Signed: 12/13/2021 1:25:57 PM By: Worthy Keeler PA-C Entered By: Worthy Keeler on 12/13/2021 13:25:57 [name], [name] ([id number]) -------------------------------------------------------------------------------- Debridement Details Patient Name: [name], [name] Date of Service: 12/13/2021 1:15 PM Medical Record Number: [id number] Patient Account Number: [id number] Date of Birth/Sex: 1966/03/17 (56 y.o. M) Treating RN: [name] Primary Care Provider: [name] Other Clinician: Referring Provider: [name] Treating Provider/Extender: [name] in Treatment: 6 Debridement Performed for Wound #1 Left Amputation Site - Digit Assessment: Performed By: Physician [name]., PA-C Debridement Type: Debridement Level of Consciousness (Pre- Awake and Alert procedure): Pre-procedure Verification/Time Out Yes - 14:05 Taken: Start Time: 14:05 Total Area Debrided (L x W): 6.5 (cm) x 2 (cm) = 13 (cm) Tissue and other material Slough, Subcutaneous, Biofilm, Slough debrided: Level: Skin/Subcutaneous Tissue Debridement Description: Excisional Instrument: Curette Bleeding: Minimum Hemostasis Achieved: Pressure End Time: 14:14 Procedural Pain: 0 Post Procedural Pain: 0 Response to Treatment: Procedure was tolerated  well Level of Consciousness (Post- Awake and Alert procedure): Post Debridement Measurements of Total Wound Length: (cm) 6.5 Width: (cm) 2 Depth: (cm) 2.1 Volume: (cm) 21.441 Character of Wound/Ulcer Post Debridement: Improved Post Procedure Diagnosis Same as Pre-procedure Electronic Signature(s) Signed: 12/13/2021 4:05:24 PM By: Worthy Keeler PA-C Signed: 12/16/2021 8:01:43 AM By: [name] RN Entered By: [name] on 12/13/2021 14:11:46 [name], [name] ([id number]) -------------------------------------------------------------------------------- HPI Details Patient Name: [name], [name] Date of Service: 12/13/2021 1:15 PM Medical Record Number: [id number] Patient Account Number: [id number] Date of Birth/Sex: 09/20/1966 (56 y.o. M) Treating RN: [name] Primary Care Provider: [name] Other Clinician: Referring Provider: [name] Treating Provider/Extender: [name] in Treatment: 6 History of Present Illness HPI Description: 10/31/2021 patient presents today for evaluation here in our clinic. He is actually being evaluated for hyperbaric oxygen therapy only. He has been seen at [facility] up to this point he is under the care of the vascular surgery specialty. Subsequently he is also under the care of the hyperbaric center there. With that being said that due to the fact that he is a dialysis patient he was actually recommended to be transferred to [place] for his treatments he actually lives in [place] which is where his dialysis is. It was a hardship for him to try to get from [place] especially on dialysis days all the way to [facility] get into the facility for his treatment and get back home he was literally spending all day long. He has had just 4 treatments there at [facility] thus far based on what I see in these have not been continued while he awaits approval here in our clinic. Subsequently I do have records for review that will be included  in the HPI predetermination review and attached to this chart as well. I will not duplicate that here. 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 Nonetheless he continues to have exposed bone with marked soft tissue swelling compatible with osteomyelitis. This is due to the irregularity of the head of the metatarsal as well. Nonetheless along with having associated cellulitis he is also good to be seen by infectious disease and is currently on antibiotics for this as well. Again that will be detailed in the pretreatment review section which I will attach to this note as well. He does currently have a wound VAC in place and again the wound was not evaluated here in the clinic as that is being completely managed by home health and [facility]. The patient does have a history of diabetes mellitus type 2, hypertension, coronary artery disease, and congestive heart failure. He also has cataracts of both eyes but no evidence of glaucoma on his most recent eye exam. 11/22/2021 we have been seeing this gentleman for hyperbaric oxygen therapy but have not actually evaluated his wound up to this point this was being managed by the wound care and vascular team I presumed at [place] at least that is what has been told to be previous. Nonetheless at this point I got a call from Atlee Abide who is a Librarian, academic at the [facility] vascular clinic with Dr. Durene Fruits and subsequently they were wanting to know if I could take over wound care for this patient. Apparently they do not have the ability for an office debridement which again I completely understand but nonetheless definitely is not an integral part of his healing along with the hyperbarics. Subsequently I discussed with the patient today that I do believe he would benefit going ahead and proceeding with  evaluation here in the clinic for that reason I did go ahead and see him today to get things started. There is also been some confusion about the issues with his home health nurse and getting measurements to [facility] for the wound VAC in order to continue his wound VAC therapy. With that being said after I see him today we will make a determination on what to do with wound VAC we can definitely send measurements to [facility] but again the bigger question is whether this is the best way to go or not. 11/28/2021 upon evaluation today patient appears to be doing decently well in regard to his wound. He has been tolerating the dressing changes with the Dakin's moistened gauze dressing which I think is doing a good job. Fortunately I do not see any signs of active infection locally nor systemically at this point. In general I think that he is actually making excellent progress which is great news. 2/17; seen in conjunction with HBO today. 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 Much improved condition of the wound surface. On the lateral foot and eschared area that he has been using Betadine. Tolerating HBO well 12/13/2021 upon evaluation today patient appears to be doing pretty well in regard to his foot ulcer. I am actually much more pleased currently that I been with the way the appearance looks today. The wound bed does show some signs of slough and biofilm on the surface of the wound this is minimal I Minna perform debridement the I will do so very carefully due to the fact that to be honest the patient had a significant amount of bleeding in the past being on the Brilinta he had a difficult time clotting when I debrided him previously quite  significantly. Fortunately I do not see any signs of active infection at this time which is great news. No fevers, chills, nausea, vomiting, or diarrhea. Electronic Signature(s) Signed: 12/13/2021 2:24:09 PM By: Worthy Keeler PA-C Entered  By: Worthy Keeler on 12/13/2021 14:24:08 [name], [name] ([id number]) -------------------------------------------------------------------------------- Physical Exam Details Patient Name: [name], [name] Date of Service: 12/13/2021 1:15 PM Medical Record Number: [id number] Patient Account Number: [id number] Date of Birth/Sex: Oct 28, 1965 (55 y.o. M) Treating RN: [name] Primary Care Provider: [name] Other Clinician: Referring Provider: [name] Treating Provider/Extender: [name] in Treatment: 6 Constitutional Well-nourished and well-hydrated in no acute distress. Respiratory normal breathing without difficulty. Psychiatric this patient is able to make decisions and demonstrates good insight into disease process. Alert and Oriented x 3. pleasant and cooperative. Notes Upon inspection patient's wound bed actually showed signs of good granulation and epithelization at this point. Fortunately I do not see any evidence of active infection locally nor systemically which is great news and overall very pleased with where we stand. I did perform debridement to try to clear away some of the biofilm and slough from the surface of the wound he tolerated that today without complication and postdebridement the wound bed appears to be doing much better which is great news. Electronic Signature(s) Signed: 12/13/2021 2:24:39 PM By: Worthy Keeler PA-C Entered By: Worthy Keeler on 12/13/2021 14:24:39 [name], [name] ([id number]) -------------------------------------------------------------------------------- Physician Orders Details Patient Name: [name], [name] Date of Service: 12/13/2021 1:15 PM Medical Record Number: [id number] Patient Account Number: [id number] Date of Birth/Sex: [birth year] (56 y.o. M) Treating RN: [name] Primary Care Provider: [name] Other Clinician: Referring Provider: [name] Treating Provider/Extender: [name] in  Treatment: 6 Verbal / Phone Orders: No Diagnosis Coding ICD-10 Coding Code Description E11.621 Type 2 diabetes mellitus with foot ulcer 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e M86.672 Other chronic osteomyelitis, left ankle and foot I10 Essential (primary) hypertension I25.10 Atherosclerotic heart disease of native coronary artery without angina pectoris I50.42 Chronic combined systolic (congestive) and diastolic (congestive) heart failure Follow-up Appointments o Return Appointment in 1 week. - provider visits at wound center o Return Appointment in: - HBO- (daily Monday -Friday for HBO Treatments) [place] for wound care. May utilize formulary equivalent dressing for wound treatment orders unless otherwise specified. Home Health Nurse may visit [facility] to address patientos wound care needs. o **Please direct any NON-WOUND related issues/requests for orders to patient's Primary Care Physician. **If current dressing causes regression in wound condition, may D/C ordered dressing product/s and apply Normal Saline Moist Dressing daily until next [place] or Other MD appointment. **Notify Wound Healing Center of regression in wound condition at [phone]. o Other Home Health Orders/Instructions: - HOLD WOUND VAC AT THIS TIME Bathing/ Shower/ Hygiene o Clean wound with Normal Saline or wound cleanser. o May shower with wound dressing protected with water repellent cover or cast protector. o No tub bath. Additional Orders / Instructions o Follow Nutritious Diet and Increase Protein Intake o Other: - Follow vascular, nephrology and PCP for wound care and other needs Dialysis appt as scheduled M-W-F Hyperbaric Oxygen Therapy o Indication and location: - Left 1st and 2nd Ray Amputation osteomyelitis o If appropriate for treatment, begin HBOT per  protocol: o 2.0 ATA for 90 Minutes without Air Breaks o One treatment per day (delivered Monday through  Friday unless otherwise specified in Special Instructions below): o Total # of Treatments: - 40 o Finger stick Blood Glucose Pre- and Post- HBOT Treatment. o Follow Hyperbaric Oxygen Glycemia Protocol Wound Treatment Wound #1 - Amputation Site - Digit Wound Laterality: Left Cleanser: Normal Saline 1 x Per Day/30 Days Discharge Instructions: Wash your hands with soap and water. Remove old dressing, discard into plastic bag and place into trash. Cleanse the wound with Normal Saline prior to applying a clean dressing using gauze sponges, not tissues or cotton balls. Do not scrub or use excessive force. Pat dry using gauze sponges, not tissue or cotton balls. Cleanser: Vashe 5.8 (oz) 1 x Per Day/30 Days Discharge Instructions: Use vashe 5.8 (oz) as directed Cleanser: Wound Cleanser 1 x Per Day/30 Days Discharge Instructions: Wash your hands with soap and water. Remove old dressing, discard into plastic bag and place into trash. Cleanse the wound with Wound Cleanser prior to applying a clean dressing using gauze sponges, not tissues or cotton balls. Do not scrub or use excessive force. Pat dry using gauze sponges, not tissue or cotton balls. [name], [name] ([id number]) Primary Dressing: Gauze 1 x Per Day/30 Days Discharge Instructions: VASHE/DAKINS-As directed: dry, moistened with saline or moistened with Dakins Solution Secondary Dressing: ABD Pad 5x9 (in/in) 1 x Per Day/30 Days Discharge Instructions: Cover with ABD pad Secured With: 3M Medipore H Soft Cloth Surgical Tape, 2x2 (in/yd) 1 x Per Day/30 Days Secured With: Kerlix Roll Sterile or Non-Sterile 6-ply 4.5x4 (yd/yd) 1 x Per Day/30 Days Discharge Instructions: Apply Kerlix as directed Wound #2 - Foot Wound Laterality: Left, Lateral Topical: Betadine Discharge Instructions: paint wound bed, allow to dry Secondary Dressing:  Kerlix 4.5 x 4.1 (in/yd) Discharge Instructions: lightly GLYCEMIA INTERVENTIONS PROTOCOL PRE-HBO GLYCEMIA INTERVENTIONS ACTION INTERVENTION Obtain pre-HBO capillary blood glucose (ensure 1 physician order is in chart). A. Notify HBO physician and await physician orders. 2 If result is 70 mg/dl or below: B. If the result meets the hospital definition of a critical result, follow hospital policy. A. Give patient an 8 ounce Glucerna Shake, an 8 ounce Ensure, or 8 ounces of a Glucerna/Ensure equivalent dietary supplement*. B. Wait 30 minutes. If result is 71 mg/dl to 130 mg/dl: C. Retest patientos capillary blood glucose (CBG). D. If result greater than or equal to 110 mg/dl, proceed with HBO. If result less than 110 mg/dl, notify HBO physician and consider holding HBO. If result is 131 mg/dl to 249 mg/dl: A. Proceed with HBO. A. Notify HBO physician and await physician orders. B. It is recommended to hold HBO and do If result is 250 mg/dl or greater: blood/urine ketone testing. C. If the result meets the hospital definition of a critical result, follow hospital policy. POST-HBO GLYCEMIA INTERVENTIONS ACTION INTERVENTION Obtain post HBO capillary blood glucose (ensure 1 physician order is in chart). A. Notify HBO physician and await physician orders. 2 If result is 70 mg/dl or below: B. If the result meets the hospital definition of a critical result, follow hospital policy. A. Give patient an 8 ounce Glucerna Shake, an 8 ounce Ensure, or 8 ounces of a Glucerna/Ensure equivalent dietary supplement*. B. Wait 15 minutes for symptoms of hypoglycemia (i.e. nervousness, anxiety, If result is 71 mg/dl to 100 mg/dl: sweating, chills, clamminess, irritability, confusion, tachycardia or dizziness). C. If patient asymptomatic, discharge patient. If patient symptomatic, repeat capillary blood glucose (CBG) and notify HBO physician. If result is 101 mg/dl to 249 mg/dl:  A. Discharge patient. If result is 250  mg/dl or greater: A. Notify HBO physician and await physician orders. B. It is recommended to do blood/urine ketone testing. [name], [name] ([id number]) C. If the result meets the hospital definition of a critical result, follow hospital policy. *Juice or candies are NOT equivalent products. If patient refuses the Glucerna or Ensure, please consult the hospital dietitian for an appropriate substitute. Electronic Signature(s) Signed: 12/13/2021 4:05:24 PM By: Worthy Keeler PA-C Signed: 12/16/2021 8:01:43 AM By: [name] RN Entered By: [name] on 12/13/2021 14:10:14 Mitro, Markees L. ([id number]) -------------------------------------------------------------------------------- Problem List Details Patient Name: [name], [name] Date of Service: 12/13/2021 1:15 PM Medical Record Number: [id number] Patient Account Number: [id number] Date of Birth/Sex: 12-07-1965 (56 y.o. M) Treating RN: [name] Primary Care Provider: [name] Other Clinician: Referring Provider: [name] Treating Provider/Extender: [name] in Treatment: 6 Active Problems ICD-10 Encounter Code Description Active Date MDM Diagnosis E11.621 Type 2 diabetes mellitus with foot ulcer 10/31/2021 No Yes L97.524 Non-pressure chronic ulcer of other part of left foot with necrosis of 10/31/2021 No Yes bone M86.672 Other chronic osteomyelitis, left ankle and foot 10/31/2021 No Yes I10 Essential (primary) hypertension 10/31/2021 No Yes I25.10 Atherosclerotic heart disease of native coronary artery without angina 10/31/2021 No Yes pectoris I50.42 Chronic combined systolic (congestive) and diastolic (congestive) heart 10/31/2021 No Yes failure Inactive Problems Resolved Problems Electronic Signature(s) Signed: 12/13/2021 1:25:52 PM By: Worthy Keeler PA-C Entered By: Worthy Keeler on 12/13/2021 13:25:52 Kochanowski, Starling L.  ([id number]) -------------------------------------------------------------------------------- Progress Note Details Patient Name: Stettner, Remon L. Date of Service: 12/13/2021 1:15 PM Medical Record Number: [id number] Patient Account Number: [id number] Date of Birth/Sex: 1965-10-29 (56 y.o. M) Treating RN: [name] Primary Care Provider: [name] Other Clinician: Referring Provider: [name] Treating Provider/Extender: [name] in Treatment: 6 Subjective Chief Complaint Information obtained from Patient HBO evaluation for treatment on the left foot diabetic ulcer with osteomyelitis. Patient is currently under our care as well for his foot. History of Present Illness (HPI) 10/31/2021 patient presents today for evaluation here in our clinic. He is actually being evaluated for hyperbaric oxygen therapy only. He has been seen at [facility] up to this point he is under the care of the vascular surgery specialty. Subsequently he is also under the care of the hyperbaric center there. With that being said that due to the fact that he is a dialysis patient he was actually recommended to be transferred to [place] for his treatments he actually lives in [place] which is where his dialysis is. It was a hardship for him to try to get from [place] especially on dialysis days all the way to [facility] get into the facility for his treatment and get back home he was literally spending all day long. He has had just 4 treatments there at [facility] thus far based on what I see in these have not been continued while he awaits approval here in our clinic. Subsequently I do have records for review that will be included in the HPI predetermination review and attached to this chart as well. I will not duplicate that here. 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 Nonetheless he continues to have exposed bone with marked soft tissue swelling compatible with osteomyelitis. This is due to the irregularity of the head of the metatarsal as well. Nonetheless along  with having associated cellulitis he is also good to be seen by infectious disease and is currently on antibiotics for this as well. Again that will be detailed in the pretreatment review section which I will attach to this note as well. He does currently have a wound VAC in place and again the wound was not evaluated here in the clinic as that is being completely managed by home health and [facility]. The patient does have a history of diabetes mellitus type 2, hypertension, coronary artery disease, and congestive heart failure. He also has cataracts of both eyes but no evidence of glaucoma on his most recent eye exam. 11/22/2021 we have been seeing this gentleman for hyperbaric oxygen therapy but have not actually evaluated his wound up to this point this was being managed by the wound care and vascular team I presumed at [place] at least that is what has been told to be previous. Nonetheless at this point I got a call from Atlee Abide who is a Librarian, academic at the [facility] vascular clinic with Dr. Durene Fruits and subsequently they were wanting to know if I could take over wound care for this patient. Apparently they do not have the ability for an office debridement which again I completely understand but nonetheless definitely is not an integral part of his healing along with the hyperbarics. Subsequently I discussed with the patient today that I do believe he would benefit going ahead and proceeding with evaluation here in the clinic for that reason I did go ahead and see him today to get things started. There is also been some confusion about the issues with his home health nurse and getting measurements to [facility] for the wound VAC in order to continue his wound VAC therapy.  With that being said after I see him today we will make a determination on what to do with wound VAC we can definitely send measurements to [facility] but again the bigger question is whether this is the best way to go or not. 11/28/2021 upon evaluation today patient appears to be doing decently well in regard to his wound. He has been tolerating the dressing changes with the Dakin's moistened gauze dressing which I think is doing a good job. Fortunately I do not see any signs of active infection locally nor systemically at this point. In general I think that he is actually making excellent progress which is great news. 2/17; seen in conjunction with HBO today. 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 Much improved condition of the wound surface. On the lateral foot and eschared area that he has been using Betadine. Tolerating HBO well 12/13/2021 upon evaluation today patient appears to be doing pretty well in regard to his foot ulcer. I am actually much more pleased currently that I been with the way the appearance looks today. The wound bed does show some signs of slough and biofilm on the surface of the wound this is minimal I Minna perform debridement the I will do so very carefully due to the fact that to be honest the patient had a significant amount of bleeding in the past being on the Brilinta he had a difficult time clotting when I debrided him previously quite significantly. Fortunately I do not see any signs of active infection at this time which is great news. No fevers, chills, nausea, vomiting, or diarrhea. Objective Constitutional Well-nourished and well-hydrated in no acute distress. Vitals Time Taken: 1:25 PM, Height:  70 in, Weight: 180.4 lbs, BMI: 25.9, Temperature: 97.9 F, Pulse: 72 bpm, Respiratory Rate: 16 breaths/min, Blood Pressure: 126/72 mmHg. Respiratory normal breathing without difficulty. Knechtel, [place] ([id number]) Psychiatric this patient  is able to make decisions and demonstrates good insight into disease process. Alert and Oriented x 3. pleasant and cooperative. General Notes: Upon inspection patient's wound bed actually showed signs of good granulation and epithelization at this point. Fortunately I do not see any evidence of active infection locally nor systemically which is great news and overall very pleased with where we stand. I did perform debridement to try to clear away some of the biofilm and slough from the surface of the wound he tolerated that today without complication and postdebridement the wound bed appears to be doing much better which is great news. Integumentary (Hair, Skin) Wound #1 status is Open. Original cause of wound was Surgical Injury. The date acquired was: 09/09/2021. The wound has been in treatment 3 weeks. 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 The wound measures 6.5cm length x 2cm width x 2.1cm depth; 10.21cm^2 area and 21.441cm^3 volume. There is muscle, tendon, Fat Layer (Subcutaneous Tissue), and fascia exposed. There is no tunneling or undermining noted. There is a medium amount of serosanguineous drainage noted. There is medium (34-66%) red granulation within the wound bed. There is a medium (34-66%) amount of necrotic tissue within the wound bed including Adherent Slough and Necrosis of Muscle. Wound #2 status is Open. Original cause of wound was Blister. The date acquired was: 10/28/2021. The wound has been in treatment 2 weeks. The wound is located on the Left,Lateral Foot. The wound measures 2cm length x 2cm width x 0.1cm depth; 3.142cm^2 area and 0.314cm^3 volume. There is no tunneling or undermining noted. There is a none present amount of drainage noted. There is no granulation within the wound bed. There is a large (67-100%) amount of necrotic tissue within the wound bed including Eschar. 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 There was a Excisional Skin/Subcutaneous Tissue Debridement with a total area of 13 sq cm performed by [name]., PA-C. With the following instrument(s): Curette Material removed includes Subcutaneous Tissue, Slough, and Biofilm. No specimens were taken. A time out was conducted at 14:05, prior to the start of the procedure. A Minimum amount of bleeding was controlled with Pressure. The procedure was tolerated well with a pain level of 0 throughout and a pain level of 0 following the procedure. Post Debridement Measurements: 6.5cm length x 2cm width x 2.1cm depth; 21.441cm^3 volume. Character of Wound/Ulcer Post Debridement is improved. Post procedure Diagnosis Wound #1: Same as Pre-Procedure Plan Follow-up Appointments: Return Appointment in 1 week. - provider visits at wound center Return Appointment in: - HBO- (daily Monday -Friday for HBO Treatments) Home Health: [facility] for wound care. May utilize formulary equivalent dressing for wound treatment orders unless otherwise specified. Home Health Nurse may visit PRN to address patient s wound care needs. **Please direct any NON-WOUND related issues/requests for orders to patient's Primary Care  Physician. **If current dressing causes regression in wound condition, may D/C ordered dressing product/s and apply Normal Saline Moist Dressing daily until next [place] or Other MD appointment. **Notify [facility] Center of regression in  wound condition at [phone]. Other Home Health Orders/Instructions: - HOLD WOUND VAC AT THIS TIME Bathing/ Shower/ Hygiene: Clean wound with Normal Saline or wound cleanser. May shower with wound dressing protected with water repellent cover or cast protector. [place], [place] ([id number]) No tub bath. Additional Orders / Instructions: Follow Nutritious Diet and Increase Protein Intake Other: - Follow vascular, nephrology and PCP for wound care and other needs Dialysis appt as scheduled M-W-F Hyperbaric Oxygen Therapy: Indication and location: - Left 1st and 2nd Ray Amputation osteomyelitis If appropriate for treatment, begin HBOT per protocol: 2.0 ATA for 90 Minutes without Air Breaks One treatment per day (delivered Monday through Friday unless otherwise specified in Special Instructions below): Total # of Treatments: - 40 Finger stick Blood Glucose Pre- and Post- HBOT Treatment. Follow Hyperbaric Oxygen Glycemia Protocol WOUND #1: - Amputation Site - Digit Wound Laterality: Left Cleanser: Normal Saline 1 x Per Day/30 Days Discharge Instructions: Wash your hands with soap and water. Remove old dressing, discard into plastic bag and place into trash. Cleanse the wound with Normal Saline prior to applying a clean dressing using gauze sponges, not tissues or cotton balls. Do not scrub or use excessive force. Pat dry using gauze sponges, not tissue or cotton balls. Cleanser: Vashe 5.8 (oz) 1 x Per Day/30 Days Discharge Instructions: Use vashe 5.8 (oz) as directed Cleanser: Wound Cleanser 1 x Per Day/30 Days Discharge Instructions: Wash your hands with soap and water. Remove old dressing, discard into plastic bag and place into trash. Cleanse the wound with Wound Cleanser prior to applying a clean dressing using gauze sponges, not tissues or cotton balls. Do not scrub or use excessive force. Pat dry using gauze sponges, not tissue or cotton balls. Primary Dressing: Gauze 1 x Per Day/30  Days Discharge Instructions: VASHE/DAKINS-As directed: dry, moistened with saline or moistened with Dakins Solution Secondary Dressing: ABD Pad 5x9 (in/in) 1 x Per Day/30 Days Discharge Instructions: Cover with ABD pad Secured With: 42M Medipore H Soft Cloth Surgical Tape, 2x2 (in/yd) 1 x Per Day/30 Days Secured With: Kerlix Roll Sterile or Non-Sterile 6-ply 4.5x4 (yd/yd) 1 x Per Day/30 Days Discharge Instructions: Apply Kerlix as directed WOUND #2: - Foot Wound Laterality: Left, Lateral Topical: Betadine Discharge Instructions: paint wound bed, allow to dry Secondary Dressing: Kerlix 4.5 x 4.1 (in/yd) Discharge Instructions: lightly 1. I am going to recommend currently that we going continue with the wound care measures as before and the patient is in agreement the plan. This includes the use of the Dakin's moistened gauze dressing which I think is doing a good job. 2. Also can recommend that we continue to monitor for any signs of worsening or infection if anything changes he should let me know. We will see patient back for reevaluation in 1 week here in the clinic. If anything worsens or changes patient will contact our office for additional recommendations. Electronic Signature(s) Signed: 12/13/2021 2:25:02 PM By: Worthy Keeler PA-C Entered By: Worthy Keeler on 12/13/2021 14:25:02 Voigt, Keagon L. ([id number]) -------------------------------------------------------------------------------- SuperBill Details Patient Name: Dawkins, Jiraiya L. Date of Service: 12/13/2021 Medical Record Number: [id number] Patient Account Number: [id number] Date of Birth/Sex: 1966-05-02 (56 y.o. M) Treating RN: [name] Primary Care Provider: [name] Other Clinician: Referring Provider: [name] Treating  Provider/Extender: Jeri Cos Weeks in Treatment: 6 Diagnosis Coding ICD-10 Codes Code Description E11.621 Type 2 diabetes mellitus with foot ulcer L97.524 Non-pressure chronic ulcer of  other part of left foot with necrosis of bone M86.672 Other chronic osteomyelitis, left ankle and foot I10 Essential (primary) hypertension I25.10 Atherosclerotic heart disease of native coronary artery without angina pectoris I50.42 Chronic combined systolic (congestive) and diastolic (congestive) heart failure Facility Procedures CPT4 Code: 32202542 Description: 70623 - DEB SUBQ TISSUE 20 SQ CM/< Modifier: Quantity: 1 CPT4 Code: Description: ICD-10 Diagnosis Description L97.524 Non-pressure chronic ulcer of other part of left foot with necrosis of b Modifier: one Quantity: Physician Procedures CPT4 Code: 7628315 Description: 11042 - WC PHYS SUBQ TISS 20 SQ CM Modifier: Quantity: 1 CPT4 Code: Description: ICD-10 Diagnosis Description L97.524 Non-pressure chronic ulcer of other part of left foot with necrosis of b Modifier: one Quantity: Electronic Signature(s) Signed: 12/13/2021 2:25:34 PM By: Worthy Keeler PA-C Entered By: Worthy Keeler on 12/13/2021 14:25:33

## 2021-12-13 NOTE — Progress Notes (Signed)
[place], [place] ([id number]) Visit Report for 12/13/2021 HBO Details Patient Name: [name], [name]. Date of Service: 12/13/2021 10:30 AM Medical Record Number: [id number] Patient Account Number: [id number] Date of Birth/Sex: 1966/04/21 (56 y.o. 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 HBO Treatment Details Treatment Number: 25 Patient Type: Outpatient Chamber Type: Monoplace Chamber Serial #: [id number] Treatment Protocol: 2.0 ATA with 90 minutes oxygen, and no air breaks Treatment Details Compression Rate Down: 1.0 psi / minute De-Compression Rate Up: 1.5 psi / minute Compress Tx Pressure Air breaks and breathing periods Decompress Decompress Begins Reached (leave unused spaces blank) Begins Ends Chamber Pressure (ATA) 1 2 - - - - - - 2 1 Clock Time (24 hr) 10:56 11:13 - - - - - - 12:44 12:55 Treatment Length: 119 (minutes) Treatment Segments: 4 Vital Signs Capillary Blood Glucose Reference Range: 80 - 120 mg / dl HBO Diabetic Blood Glucose Intervention Range: <131 mg/dl or >249 mg/dl Time Vitals Blood Respiratory Capillary Blood Glucose Pulse Action Type: Pulse: Temperature: Taken: Pressure: Rate: Glucose (mg/dl): Meter #: Oximetry (%) Taken: Pre 10:50 124/70 78 16 97.9 233 1 none per protocol Post 12:58 126/72 72 16 97.9 111 1 none per protocol Treatment Response Treatment Toleration: Well Treatment Completion Treatment Completed without Adverse Event Status: Electronic Signature(s) Signed: 12/13/2021 2:06:06 PM By: Zackery Barefoot, RRT, CHT Signed: 12/13/2021 4:05:24 PM By: Worthy Keeler  PA-C Entered By: Stark Jock, Amado Nash on 12/13/2021 14:02:57 [place], [place] ([id number]) -------------------------------------------------------------------------------- HBO Safety Checklist Details Patient Name: [name], [name] Date of Service: 12/13/2021 10:30 AM Medical Record Number: [id number] Patient Account Number: [id number] Date of Birth/Sex: 02-Oct-1966 (56 y.o. M) Treating RN: Donnamarie Poag Primary Care Breslin Hemann: [name] Other Clinician: Jacqulyn Bath Referring Arminta Gamm: [name] Treating Abdulraheem Pineo/Extender: [name] in Treatment: 6 HBO Safety Checklist Items Safety Checklist Consent Form Signed Patient voided / foley secured and emptied When did you last eato 10:00 am Last dose of injectable or oral agent n/a Ostomy pouch emptied and vented if applicable NA All implantable devices assessed, documented and approved NA Intravenous access site secured and place NA Valuables secured Linens and cotton and cotton/polyester blend (less than 51% polyester) Personal oil-based products / skin lotions / body lotions removed Wigs or hairpieces removed NA Smoking or tobacco materials removed NA Books / newspapers / magazines / loose paper removed NA Cologne, aftershave, perfume and deodorant removed Jewelry removed (may wrap wedding band) Make-up removed NA Hair care products removed Battery operated devices (external) removed NA Heating patches and chemical warmers removed NA Titanium eyewear removed NA Nail polish cured greater than 10 hours NA Casting material cured greater than 10 hours NA Hearing aids removed NA Loose dentures or partials removed NA Prosthetics have been removed NA Patient demonstrates correct use of air break device (if applicable) Patient concerns have been addressed Patient grounding bracelet on and cord attached to chamber Specifics for Inpatients (complete in addition to above) Medication sheet sent with  patient Intravenous medications needed or due during therapy sent with patient Drainage tubes (e.g. nasogastric tube or chest tube secured and vented) Endotracheal or Tracheotomy tube secured Cuff deflated of air and inflated with saline Airway suctioned Electronic Signature(s) Signed: 12/13/2021  2:06:06 PM By: Enedina Finner RCP, RRT, CHT Entered By: Enedina Finner on 12/13/2021 11:21:07

## 2021-12-13 NOTE — Progress Notes (Signed)
[name], [place] ([id number]) Visit Report for 12/13/2021 Arrival Information Details Patient Name: [name], [name]. Date of Service: 12/13/2021 10:30 AM Medical Record Number: [id number] Patient Account Number: [id number] Date of Birth/Sex: [birth year] (56 y.o. M) Treating RN: Donnamarie Poag Primary Care Cedrica Brune: [name] Other Clinician: Jacqulyn Bath Referring Aydrian Halpin: [name] Treating Etherine Mackowiak/Extender: [name] in Treatment: 6 Visit Information History Since Last Visit Added or deleted any medications: No Patient Arrived: Ambulatory Any new allergies or adverse reactions: No Arrival Time: 10:20 Had a fall or experienced change in No Accompanied By: self activities of daily living that may affect Transfer Assistance: None risk of falls: Patient Identification Verified: Yes Signs or symptoms of abuse/neglect since last visito No Secondary Verification Process Completed: Yes Hospitalized since last visit: No Patient Requires Transmission-Based No Implantable device outside of the clinic excluding No Precautions: cellular tissue based products placed in the center Patient Has Alerts: Yes since last visit: Patient Alerts: Patient on Blood Has Dressing in Place as Prescribed: Yes Thinner Has Footwear/Offloading in Place as Prescribed: Yes DIALYSIS RIGHT ARM Left: Surgical Shoe with Pressure Aspirin 81/Brilinta Relief Insole Pain Present Now: No Electronic Signature(s) Signed: 12/13/2021 2:06:06 PM By: Enedina Finner RCP, RRT, CHT Entered By: Stark Jock, Amado Nash on 12/13/2021 11:16:13 Eckard, Jewett L. ([id number]) -------------------------------------------------------------------------------- Encounter Discharge Information Details Patient Name: [place], [name] Date of Service: 12/13/2021 10:30 AM Medical Record Number: [id number] Patient Account Number: [id number] Date of Birth/Sex: 31-Dec-1965 (56 y.o. M) Treating RN: Donnamarie Poag Primary Care  Juvencio Verdi: [name] Other Clinician: Jacqulyn Bath Referring Telesia Ates: [name] Treating Yazmine Sorey/Extender: [name] in Treatment: 6 Encounter Discharge Information Items Discharge Condition: Stable Ambulatory Status: Ambulatory Discharge Destination: Home Transportation: Private Auto Accompanied By: self Schedule Follow-up Appointment: Yes Clinical Summary of Care: Notes Patient has an HBO appointment scheduled on 12/16/21 at 10:30 am. Electronic Signature(s) Signed: 12/13/2021 2:06:06 PM By: Enedina Finner RCP, RRT, CHT Entered By: Enedina Finner on 12/13/2021 14:05:35 Alcaide, Ava L. ([id number]) -------------------------------------------------------------------------------- Vitals Details Patient Name: Kolden, [name] Date of Service: 12/13/2021 10:30 AM Medical Record Number: [id number] Patient Account Number: [id number] Date of Birth/Sex: 1966-05-11 (56 y.o. M) Treating RN: Donnamarie Poag Primary Care Loye Vento: [name] Other Clinician: Jacqulyn Bath Referring Sharonna Vinje: [name] Treating Robi Mitter/Extender: [name] in Treatment: 6 Vital Signs Time Taken: 10:50 Temperature (F): 97.9 Height (in): 70 Pulse (bpm): 78 Weight (lbs): 180.4 Respiratory Rate (breaths/min): 16 Body Mass Index (BMI): 25.9 Blood Pressure (mmHg): 124/70 Capillary Blood Glucose (mg/dl): 233 Reference Range: 80 - 120 mg / dl Electronic Signature(s) Signed: 12/13/2021 2:06:06 PM By: Enedina Finner RCP, RRT, CHT Entered By: Enedina Finner on 12/13/2021 11:19:08

## 2021-12-16 ENCOUNTER — Encounter: Payer: Medicare Other | Admitting: Physician Assistant

## 2021-12-16 ENCOUNTER — Other Ambulatory Visit: Payer: Self-pay

## 2021-12-16 DIAGNOSIS — Z992 Dependence on renal dialysis: Secondary | ICD-10-CM | POA: Diagnosis not present

## 2021-12-16 DIAGNOSIS — I5042 Chronic combined systolic (congestive) and diastolic (congestive) heart failure: Secondary | ICD-10-CM | POA: Diagnosis not present

## 2021-12-16 DIAGNOSIS — L97524 Non-pressure chronic ulcer of other part of left foot with necrosis of bone: Secondary | ICD-10-CM | POA: Diagnosis not present

## 2021-12-16 DIAGNOSIS — E1169 Type 2 diabetes mellitus with other specified complication: Secondary | ICD-10-CM | POA: Diagnosis not present

## 2021-12-16 DIAGNOSIS — N186 End stage renal disease: Secondary | ICD-10-CM | POA: Diagnosis not present

## 2021-12-16 DIAGNOSIS — I110 Hypertensive heart disease with heart failure: Secondary | ICD-10-CM | POA: Diagnosis not present

## 2021-12-16 DIAGNOSIS — N2581 Secondary hyperparathyroidism of renal origin: Secondary | ICD-10-CM | POA: Diagnosis not present

## 2021-12-16 DIAGNOSIS — E11621 Type 2 diabetes mellitus with foot ulcer: Secondary | ICD-10-CM | POA: Diagnosis not present

## 2021-12-16 DIAGNOSIS — M86672 Other chronic osteomyelitis, left ankle and foot: Secondary | ICD-10-CM | POA: Diagnosis not present

## 2021-12-16 DIAGNOSIS — D631 Anemia in chronic kidney disease: Secondary | ICD-10-CM | POA: Diagnosis not present

## 2021-12-16 LAB — GLUCOSE, CAPILLARY
Glucose-Capillary: 118 mg/dL — ABNORMAL HIGH (ref 70–99)
Glucose-Capillary: 95 mg/dL (ref 70–99)
Glucose-Capillary: 200 mg/dL — ABNORMAL HIGH (ref 70–99)

## 2021-12-16 NOTE — Progress Notes (Signed)
[name], [name] ([id number]) Visit Report for 12/16/2021 HBO Details Patient Name: [name], [name]. Date of Service: 12/16/2021 10:30 AM Medical Record Number: [id number] Patient Account Number: [id number] Date of Birth/Sex: Feb 21, 1966 (56 y.o. M) Treating RN: [name] Primary Care [name]: [name] Other Clinician: Jacqulyn [name] Referring [name]: [name] Treating [name]/Extender: [name] in Treatment: 6 HBO Treatment Course Details Treatment Course Number: 1 Ordering [name]: [name] Total Treatments Ordered: 40 HBO Treatment Start Date: 11/04/2021 HBO Indication: Chronic Refractory Osteomyelitis to Left Foot HBO Treatment Details Treatment Number: 26 Patient Type: Outpatient Chamber Type: Monoplace Chamber Serial #: [id number] Treatment Protocol: 2.0 ATA with 90 minutes oxygen, and no air breaks Treatment Details Compression Rate Down: 1.0 psi / minute De-Compression Rate Up: 1.5 psi / minute Compress Tx Pressure Air breaks and breathing periods Decompress Decompress Begins Reached (leave unused spaces blank) Begins Ends Chamber Pressure (ATA) 1 2 - - - - - - 2 1 Clock Time (24 hr) 11:07 11:24 - - - - - - 12:55 13:05 Treatment Length: 118 (minutes) Treatment Segments: 4 Vital Signs Capillary Blood Glucose Reference Range: 80 - 120 mg / dl HBO Diabetic Blood Glucose Intervention Range: <131 mg/dl or >249 mg/dl Time Capillary Blood Glucose Pulse Blood Respiratory Action Type: Vitals Pulse: Temperature: Glucose Meter Oximetry Pressure: Rate: Taken: Taken: (mg/dl): #: (%) Gave saltines, [name] crackers, Pre 10:38 138/68 72 16 98.6 95 1 juice per PA Pre 10:53 132/78 78 16 98.5 118 1 Proceed with HBO per PA Treatment Response Treatment Toleration: Well Treatment Completion Treatment Completed without Adverse Event Status: Electronic Signature(s) Signed: 12/16/2021 1:25:39 PM By: [name] RCP, RRT, CHT Signed: 12/16/2021  5:31:39 PM By: Worthy Keeler PA-C Entered By: Stark Jock, Amado Nash on 12/16/2021 13:24:13 [name], [name] ([id number]) -------------------------------------------------------------------------------- HBO Safety Checklist Details Patient Name: [name], [name] Date of Service: 12/16/2021 10:30 AM Medical Record Number: [id number] Patient Account Number: [id number] Date of Birth/Sex: 1966-05-17 (56 y.o. M) Treating RN: [name] Primary Care Fynn Adel: [name] Other Clinician: Jacqulyn [name] Referring Tamakia Porto: [name] Treating Jolyn Deshmukh/Extender: [name] in Treatment: 6 HBO Safety Checklist Items Safety Checklist Consent Form Signed Patient voided / foley secured and emptied When did you last eato 10:00 am Last dose of injectable or oral agent n/a Ostomy pouch emptied and vented if applicable NA All implantable devices assessed, documented and approved NA Intravenous access site secured and place NA Valuables secured Linens and cotton and cotton/polyester blend (less than 51% polyester) Personal oil-based products / skin lotions / body lotions removed Wigs or hairpieces removed NA Smoking or tobacco materials removed NA Books / newspapers / magazines / loose paper removed NA Cologne, aftershave, perfume and deodorant removed Jewelry removed (may wrap wedding band) Make-up removed NA Hair care products removed Battery operated devices (external) removed NA Heating patches and chemical warmers removed NA Titanium eyewear removed NA Nail polish cured greater than 10 hours NA Casting material cured greater than 10 hours NA Hearing aids removed NA Loose dentures or partials removed NA Prosthetics have been removed NA Patient demonstrates correct use of air break device (if applicable) Patient concerns have been addressed Patient grounding bracelet on and cord attached to chamber Specifics for Inpatients (complete in addition  to above) Medication sheet sent with patient Intravenous medications needed or due during therapy sent with patient Drainage tubes (e.g. nasogastric tube or chest tube secured and vented) Endotracheal or Tracheotomy tube secured Cuff deflated of air and inflated with saline  Airway suctioned Electronic Signature(s) Signed: 12/16/2021 1:25:39 PM By: [name] RCP, RRT, CHT Entered By: [name] on 12/16/2021 11:26:50

## 2021-12-16 NOTE — Progress Notes (Signed)
Zweig, [place] ([id number]) Visit Report for 12/16/2021 Arrival Information Details Patient Name: [name], [name]. Date of Service: 12/16/2021 10:30 AM Medical Record Number: [id number] Patient Account Number: [id number] Date of Birth/Sex: 1966/04/10 (56 y.o. M) Treating RN: Donnamarie Poag Primary Care Raphael Fitzpatrick: [name] Other Clinician: Jacqulyn Bath Referring Elizar Alpern: [name] Treating Tatisha Cerino/Extender: [name] in Treatment: 6 Visit Information History Since Last Visit Added or deleted any medications: No Patient Arrived: Ambulatory Any new allergies or adverse reactions: No Arrival Time: 11:35 Had a fall or experienced change in No Accompanied By: self activities of daily living that may affect Transfer Assistance: None risk of falls: Patient Identification Verified: Yes Signs or symptoms of abuse/neglect since last visito No Secondary Verification Process Completed: Yes Hospitalized since last visit: No Patient Requires Transmission-Based No Implantable device outside of the clinic excluding No Precautions: cellular tissue based products placed in the center Patient Has Alerts: Yes since last visit: Patient Alerts: Patient on Blood Has Dressing in Place as Prescribed: Yes Thinner Has Footwear/Offloading in Place as Prescribed: Yes DIALYSIS RIGHT ARM Left: Surgical Shoe with Pressure Aspirin 81/Brilinta Relief Insole Pain Present Now: No Electronic Signature(s) Signed: 12/16/2021 1:25:39 PM By: Enedina Finner RCP, RRT, CHT Entered By: Stark Jock, Amado Nash on 12/16/2021 11:21:13 Fagerstrom, Kerin L. ([id number]) -------------------------------------------------------------------------------- Encounter Discharge Information Details Patient Name: [name], [name] Date of Service: 12/16/2021 10:30 AM Medical Record Number: [id number] Patient Account Number: [id number] Date of Birth/Sex: [birth year] (56 y.o. M) Treating RN: Donnamarie Poag Primary Care  Kimora Stankovic: [name] Other Clinician: Jacqulyn Bath Referring Jamall Strohmeier: [name] Treating Liz Pinho/Extender: [name] in Treatment: 6 Encounter Discharge Information Items Discharge Condition: Stable Ambulatory Status: Ambulatory Discharge Destination: Home Transportation: Private Auto Accompanied By: self Schedule Follow-up Appointment: Yes Clinical Summary of Care: Notes Patient has an HBO treatment scheduled on 12/17/21 at 10:30 am. Electronic Signature(s) Signed: 12/16/2021 1:25:39 PM By: Enedina Finner RCP, RRT, CHT Entered By: Stark Jock, Amado Nash on 12/16/2021 13:25:15 [name], [name] ([id number]) -------------------------------------------------------------------------------- Vitals Details Patient Name: [name], [name] Date of Service: 12/16/2021 10:30 AM Medical Record Number: [id number] Patient Account Number: [id number] Date of Birth/Sex: [birth year] (56 y.o. M) Treating RN: Donnamarie Poag Primary Care Camdyn Beske: [name] Other Clinician: Jacqulyn Bath Referring Luwanna Brossman: [name] Treating Idaly Verret/Extender: [name] in Treatment: 6 Vital Signs Time Taken: 10:38 Temperature (F): 98.6 Height (in): 70 Pulse (bpm): 72 Weight (lbs): 180.4 Respiratory Rate (breaths/min): 16 Body Mass Index (BMI): 25.9 Blood Pressure (mmHg): 138/68 Capillary Blood Glucose (mg/dl): 95 Reference Range: 80 - 120 mg / dl Electronic Signature(s) Signed: 12/16/2021 1:25:39 PM By: Enedina Finner RCP, RRT, CHT Entered By: Enedina Finner on 12/16/2021 11:25:27

## 2021-12-16 NOTE — Progress Notes (Signed)
Dirks, Gleed ([id number]) Visit Report for 12/13/2021 Arrival Information Details Patient Name: [name], [name]. Date of Service: 12/13/2021 1:15 PM Medical Record Number: [id number] Patient Account Number: [id number] Date of Birth/Sex: Sep 05, 1966 (56 y.o. M) Treating RN: [name] Primary Care [name]: [name] Other Clinician: Referring [name]: [name] Treating [name]/Extender: [name] in Treatment: 6 Visit Information History Since Last Visit All ordered tests and consults were completed: No Patient Arrived: Ambulatory Added or deleted any medications: No Arrival Time: 13:12 Any new allergies or adverse reactions: No Accompanied By: self Had a fall or experienced change in No Transfer Assistance: None activities of daily living that may affect Patient Identification Verified: Yes risk of falls: Secondary Verification Process Completed: Yes Signs or symptoms of abuse/neglect since last visito No Patient Requires Transmission-Based No Hospitalized since last visit: No Precautions: Implantable device outside of the clinic excluding No Patient Has Alerts: Yes cellular tissue based products placed in the center Patient Alerts: Patient on Blood since last visit: Thinner Has Dressing in Place as Prescribed: Yes DIALYSIS RIGHT ARM Pain Present Now: No Aspirin 81/Brilinta Electronic Signature(s) Signed: 12/16/2021 8:01:43 AM By: [name] Coria RN Entered By: [name] on 12/13/2021 13:25:52 [name] ([id number]) -------------------------------------------------------------------------------- Clinic Level of Care Assessment Details Patient Name: [name], [name] Date of Service: 12/13/2021 1:15 PM Medical Record Number: [id number] Patient Account Number: [id number] Date of Birth/Sex: [birth year] (56 y.o. M) Treating RN: [name] Primary Care [name]: [name] Other Clinician: Referring [name]: [name] Treating  [name]/Extender: [name] in Treatment: 6 Clinic Level of Care Assessment Items TOOL 1 Quantity Score []  - Use when EandM and Procedure is performed on INITIAL visit 0 ASSESSMENTS - Nursing Assessment / Reassessment []  - General Physical Exam (combine w/ comprehensive assessment (listed just below) when performed on new 0 pt. evals) []  - 0 Comprehensive Assessment (HX, ROS, Risk Assessments, Wounds Hx, etc.) ASSESSMENTS - Wound and Skin Assessment / Reassessment []  - Dermatologic / Skin Assessment (not related to wound area) 0 ASSESSMENTS - Ostomy and/or Continence Assessment and Care []  - Incontinence Assessment and Management 0 []  - 0 Ostomy Care Assessment and Management (repouching, etc.) PROCESS - Coordination of Care []  - Simple Patient / Family Education for ongoing care 0 []  - 0 Complex (extensive) Patient / Family Education for ongoing care []  - 0 Staff obtains Programmer, systems, Records, Test Results / Process Orders []  - 0 Staff telephones HHA, Nursing Homes / Clarify orders / etc []  - 0 Routine Transfer to another Facility (non-emergent condition) []  - 0 Routine Hospital Admission (non-emergent condition) []  - 0 New Admissions / Biomedical engineer / Ordering NPWT, Apligraf, etc. []  - 0 Emergency Hospital Admission (emergent condition) PROCESS - Special Needs []  - Pediatric / Minor Patient Management 0 []  - 0 Isolation Patient Management []  - 0 Hearing / Language / Visual special needs []  - 0 Assessment of Community assistance (transportation, D/C planning, etc.) []  - 0 Additional assistance / Altered mentation []  - 0 Support Surface(s) Assessment (bed, cushion, seat, etc.) INTERVENTIONS - Miscellaneous []  - External ear exam 0 []  - 0 Patient Transfer (multiple staff / Civil Service fast streamer / Similar devices) []  - 0 Simple Staple / Suture removal (25 or less) []  - 0 Complex Staple / Suture removal (26 or more) []  - 0 Hypo/Hyperglycemic Management (do not  check if billed separately) []  - 0 Ankle / Brachial Index (ABI) - do not check if billed separately Has the patient been seen at the  hospital within the last three years: Yes Total Score: 0 Level Of Care: ____ [name], [name] ([id number]) Electronic Signature(s) Signed: 12/16/2021 8:01:43 AM By: [name] Coria RN Entered By: [name] on 12/13/2021 14:12:16 [name] ([id number]) -------------------------------------------------------------------------------- Encounter Discharge Information Details Patient Name: [name], [name] Date of Service: 12/13/2021 1:15 PM Medical Record Number: [id number] Patient Account Number: [id number] Date of Birth/Sex: [birth year] (56 y.o. M) Treating RN: [name] Primary Care Katleen Carraway: [name] Other Clinician: Referring Tashae Inda: [name] Treating Coreen Shippee/Extender: [name] in Treatment: 6 Encounter Discharge Information Items Post Procedure Vitals Discharge Condition: Stable Temperature (F): 97.9 Ambulatory Status: Ambulatory Pulse (bpm): 72 Discharge Destination: Home Respiratory Rate (breaths/min): 16 Transportation: Private Auto Blood Pressure (mmHg): 126/72 Accompanied By: self Schedule Follow-up Appointment: Yes Clinical Summary of Care: Patient Declined Electronic Signature(s) Signed: 12/16/2021 8:01:43 AM By: [name] Coria RN Entered By: [name] on 12/13/2021 14:14:03 [name] ([id number]) -------------------------------------------------------------------------------- Lower Extremity Assessment Details Patient Name: [name], [name] Date of Service: 12/13/2021 1:15 PM Medical Record Number: [id number] Patient Account Number: [id number] Date of Birth/Sex: February 07, 1966 (56 y.o. M) Treating RN: [name] Primary Care Oshae Simmering: [name] Other Clinician: Referring Sahmir Weatherbee: [name] Treating Mykaela Arena/Extender: [name] in Treatment: 6 Electronic Signature(s) Signed: 12/16/2021  8:01:43 AM By: [name] Coria RN Entered By: [name] on 12/13/2021 13:28:17 [name], [name] ([id number]) -------------------------------------------------------------------------------- Multi Wound Chart Details Patient Name: Ivins, Raji L. Date of Service: 12/13/2021 1:15 PM Medical Record Number: [id number] Patient Account Number: [id number] Date of Birth/Sex: January 13, 1966 (56 y.o. 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 Comorbid History: Cataracts, Anemia, Sleep Apnea, Cataracts, Anemia, Sleep Apnea, N/A Arrhythmia, Congestive Heart Arrhythmia, Congestive Heart Failure, Coronary Artery Disease, Failure, Coronary Artery Disease, Hypertension, Myocardial Infarction, Hypertension, Myocardial Infarction, Type II Diabetes, Neuropathy, Type II Diabetes, Neuropathy, Seizure Disorder Seizure Disorder Date Acquired: 09/09/2021 10/28/2021 N/A Weeks of Treatment: 3 2 N/A Wound Status: Open Open N/A Wound Recurrence: No No N/A Measurements L x W x D (cm) 6.5x2x2.1 2x2x0.1 N/A Area (cm) : 10.21 3.142 N/A Volume (cm) : 21.441 0.314 N/A % Reduction in Area: 27.80% 0.00% N/A % Reduction in Volume: 47.70% 0.00% N/A Classification: Full Thickness With Exposed Unable to visualize wound bed N/A Support Structures Exudate Amount: Medium None Present N/A Exudate Type: Serosanguineous N/A N/A Exudate Color: red, brown N/A N/A Granulation Amount: Medium (34-66%) None  Present (0%) N/A Granulation Quality: Red N/A N/A Necrotic Amount: Medium (34-66%) Large (67-100%) N/A Necrotic Tissue: Adherent Slough Eschar N/A Exposed Structures: Fascia: Yes Fascia: No N/A Fat Layer (Subcutaneous Tissue): Fat Layer (Subcutaneous Tissue): Yes No Tendon: Yes Tendon: No Muscle: Yes Muscle: No Joint: No Joint: No Bone: No Bone: No Epithelialization: None None N/A Treatment Notes Electronic Signature(s) Wax, Saketh L. ([id number]) Signed: 12/16/2021 8:01:43 AM By: [name] Coria RN Entered By: [name] on 12/13/2021 14:07:25 Bernardini, [place] ([id number]) -------------------------------------------------------------------------------- [place] Details Patient Name: Thurman, [name] BONDAR. Date of Service: 12/13/2021 1:15 PM Medical Record Number: [id number] Patient Account Number: [id number] Date of Birth/Sex: 09/22/1966 (56 y.o. M) Treating RN: [name] Primary Care Alesa Echevarria:  [name] Other Clinician: Referring Tyre Beaver: [name] Treating Marykate Heuberger/Extender: [name] in Treatment: 6 Active Inactive HBO Nursing Diagnoses: Anxiety related to feelings of confinement associated with the hyperbaric oxygen chamber Anxiety related to knowledge deficit of hyperbaric oxygen therapy and treatment procedures Discomfort related to temperature and humidity changes inside hyperbaric chamber Potential for barotraumas to ears, sinuses, teeth, and lungs or cerebral gas embolism related to changes in atmospheric pressure inside hyperbaric oxygen chamber Potential for oxygen toxicity seizures related to delivery of 100% oxygen at an increased atmospheric pressure Potential for pulmonary oxygen toxicity related to delivery of 100% oxygen at an increased atmospheric pressure Goals: Barotrauma will be prevented during HBO2 Date Initiated: 10/31/2021 Target Resolution Date: 12/06/2021 Goal Status: Active Patient and/or family will be able to  state/discuss factors appropriate to the management of their disease process during treatment Date Initiated: 10/31/2021 Target Resolution Date: 11/15/2021 Goal Status: Active Patient will tolerate the hyperbaric oxygen therapy treatment Date Initiated: 10/31/2021 Target Resolution Date: 12/06/2021 Goal Status: Active Patient will tolerate the internal climate of the chamber Date Initiated: 10/31/2021 Target Resolution Date: 11/29/2021 Goal Status: Active Patient/caregiver will verbalize understanding of HBO goals, rationale, procedures and potential hazards Date Initiated: 10/31/2021 Target Resolution Date: 11/19/2021 Goal Status: Active Signs and symptoms of pulmonary oxygen toxicity will be recognized and promptly addressed Date Initiated: 10/31/2021 Target Resolution Date: 12/13/2021 Goal Status: Active Signs and symptoms of seizure will be recognized and promptly addressed ; seizing patients will suffer no harm Date Initiated: 10/31/2021 Target Resolution Date: 12/20/2021 Goal Status: Active Interventions: Administer a five (5) minute air break for patient if signs and symptoms of seizure appear and notify the hyperbaric physician Administer a ten (10) minute air break for patient if signs and symptoms of seizure appear and notify the hyperbaric physician Administer the correct therapeutic gas delivery based on the patients needs and limitations, per physician order Assess and provide for patientos comfort related to the hyperbaric environment and equalization of middle ear Assess for signs and symptoms related to adverse events, including but not limited to confinement anxiety, pneumothorax, oxygen toxicity and baurotrauma Assess patient for any history of confinement anxiety Assess patient's knowledge and expectations regarding hyperbaric medicine and provide education related to the hyperbaric environment, goals of treatment and prevention of adverse events Notes: Necrotic  Tissue Nursing Diagnoses: Impaired tissue integrity related to necrotic/devitalized tissue Knowledge deficit related to management of necrotic/devitalized tissue Modi, [name] ([id number]) Goals: Necrotic/devitalized tissue will be minimized in the wound bed Date Initiated: 11/28/2021 Target Resolution Date: 11/28/2021 Goal Status: Active Patient/caregiver will verbalize understanding of reason and process for debridement of necrotic tissue Date Initiated: 11/28/2021 Target Resolution Date: 11/28/2021 Goal Status: Active Interventions: Assess patient pain level pre-, during and post procedure and prior to discharge Provide education on necrotic tissue and debridement process Treatment Activities: Apply topical anesthetic as ordered : 11/28/2021 Excisional debridement : 11/28/2021 Notes: Wound/Skin Impairment Nursing Diagnoses: Impaired tissue integrity Knowledge deficit related to smoking impact on wound healing Knowledge deficit related to ulceration/compromised skin integrity Goals: Ulcer/skin breakdown will have a volume reduction of 30% by week 4 Date Initiated: 11/28/2021 Target Resolution Date: 12/12/2021 Goal Status: Active Interventions: Assess patient/caregiver ability to obtain necessary supplies Assess patient/caregiver ability to perform ulcer/skin care regimen upon admission and as needed Assess ulceration(s) every visit Treatment Activities: Topical wound management initiated : 11/28/2021 Notes: Electronic Signature(s) Signed: 12/16/2021 8:01:43 AM By: [name] Coria RN Entered By: [name] on 12/13/2021 14:07:09 Faul, [name] ([id number]) --------------------------------------------------------------------------------  Pain Assessment Details Patient Name: Luppino, Saleh L. Date of Service: 12/13/2021 1:15 PM Medical Record Number: [id number] Patient Account Number: [id number] Date of Birth/Sex: August 24, 1966 (56 y.o. M) Treating RN: [name] Primary Care Jeriah Skufca:  [name] Other Clinician: Referring Talisa Petrak: [name] Treating Liliauna Santoni/Extender: [name] in Treatment: 6 Active Problems Location of Pain Severity and Description of Pain Patient Has Paino No Site Locations Pain Management and Medication Current Pain Management: Electronic Signature(s) Signed: 12/16/2021 8:01:43 AM By: [name] Coria RN Entered By: [name] on 12/13/2021 13:26:33 Utter, [name] ([id number]) -------------------------------------------------------------------------------- Patient/Caregiver Education Details Patient Name: Jeddo, [name] Date of Service: 12/13/2021 1:15 PM Medical Record Number: [id number] Patient Account Number: [id number] Date of Birth/Gender: [birth year] (56 y.o. M) Treating RN: [name] Primary Care Physician: [name] Other Clinician: Referring Physician: Nolene [name] Treating Physician/Extender: [name] in Treatment: 6 Education Assessment Education Provided To: Patient Education Topics Provided Wound Debridement: Methods: Explain/Verbal Responses: State content correctly Electronic Signature(s) Signed: 12/16/2021 8:01:43 AM By: [name] Coria RN Entered By: [name] on 12/13/2021 14:12:44 Mabie, [name] ([id number]) -------------------------------------------------------------------------------- Wound Assessment Details Patient Name: Batie, [name] Date of Service: 12/13/2021 1:15 PM Medical Record Number: [id number] Patient Account Number: [id number] Date of Birth/Sex: Sep 29, 1966 (56 y.o. M) Treating RN: [name] Primary Care Emmagrace Runkel: [name] Other Clinician: Referring Caiden Arteaga: [name] Treating Mikelle Myrick/Extender: [name] in Treatment: 6 Wound Status Wound Number: 1 Primary Open Surgical Wound Etiology: Wound Location: Left Amputation Site - Digit Wound Open Wounding Event: Surgical Injury Status: Date Acquired: 09/09/2021 Comorbid Cataracts, Anemia, Sleep  Apnea, Arrhythmia, Congestive Weeks Of Treatment: 3 History: Heart Failure, Coronary Artery Disease, Hypertension, Clustered Wound: No Myocardial Infarction, Type II Diabetes, Neuropathy, Seizure Disorder Photos Wound Measurements Length: (cm) 6.5 Width: (cm) 2 Depth: (cm) 2.1 Area: (cm) 10.21 Volume: (cm) 21.441 % Reduction in Area: 27.8% % Reduction in Volume: 47.7% Epithelialization: None Tunneling: No Undermining: No Wound Description Classification: Full Thickness With Exposed Support Structures Exudate Amount: Medium Exudate Type: Serosanguineous Exudate Color: red, brown Foul Odor After Cleansing: No Slough/Fibrino Yes Wound Bed Granulation Amount: Medium (34-66%) Exposed Structure Granulation Quality: Red Fascia Exposed: Yes Necrotic Amount: Medium (34-66%) Fat Layer (Subcutaneous Tissue) Exposed: Yes Necrotic Quality: Adherent Slough Tendon Exposed: Yes Muscle Exposed: Yes Necrosis of Muscle: Yes Joint Exposed: No Bone Exposed: No Treatment Notes Wound #1 (Amputation Site - Digit) Wound Laterality: Left Cleanser Normal Saline Discharge Instruction: Wash your hands with soap and water. Remove old dressing, discard into plastic bag and place into trash. Cleanse the wound with Normal Saline prior to applying a clean dressing using gauze sponges, not tissues or cotton balls. Do not [place], [place] ([id number]) scrub or use excessive force. Pat dry using gauze sponges, not tissue or cotton balls. Vashe 5.8 (oz) Discharge Instruction: Use vashe 5.8 (oz) as directed Wound Cleanser Discharge Instruction: Wash your hands with soap and water. Remove old dressing, discard into plastic bag and place into trash. Cleanse the wound with Wound Cleanser prior to applying a clean dressing using gauze sponges, not tissues or cotton balls. Do not scrub or use excessive force. Pat dry using gauze sponges, not tissue or cotton balls. Peri-Wound Care Topical Primary  Dressing Gauze Discharge Instruction: VASHE/DAKINS-As directed: dry, moistened with saline or moistened with Dakins Solution Secondary Dressing ABD Pad 5x9 (in/in) Discharge Instruction: Cover with ABD pad Secured With 78M Medipore H Soft Cloth Surgical Tape, 2x2 (in/yd) Kerlix Roll Sterile or Non-Sterile 6-ply 4.5x4 (yd/yd) Discharge Instruction:  Apply Kerlix as directed Compression Wrap Compression Stockings Add-Ons Electronic Signature(s) Signed: 12/16/2021 8:01:43 AM By: [name] Coria RN Entered By: [name] on 12/13/2021 13:27:25 [place], [name] ([id number]) -------------------------------------------------------------------------------- Wound Assessment Details Patient Name: Njie, [name] Date of Service: 12/13/2021 1:15 PM Medical Record Number: [id number] Patient Account Number: [id number] Date of Birth/Sex: 22-Aug-1966 (56 y.o. M) Treating RN: [name] Primary Care Igor Bishop: [name] Other Clinician: Referring Gizelle Whetsel: [name] Treating Kerstin Crusoe/Extender: [name] in Treatment: 6 Wound Status Wound Number: 2 Primary Diabetic Wound/Ulcer of the Lower Extremity Etiology: Wound Location: Left, Lateral Foot Wound Open Wounding Event: Blister Status: Date Acquired: 10/28/2021 Comorbid Cataracts, Anemia, Sleep Apnea, Arrhythmia, Congestive Weeks Of Treatment: 2 History: Heart Failure, Coronary Artery Disease, Hypertension, Clustered Wound: No Myocardial Infarction, Type II Diabetes, Neuropathy, Seizure Disorder Photos Wound Measurements Length: (cm) 2 % Redu Width: (cm) 2 % Redu Depth: (cm) 0.1 Epithe Area: (cm) 3.142 [place] Volume: (cm) 0.314 Under ction in Area: 0% ction in Volume: 0% lialization: None ling: No mining: No Wound Description Classification: Unable to visualize wound bed Foul O Exudate Amount: None Present Slough dor After Cleansing: No /Fibrino Yes Wound Bed Granulation Amount: None Present (0%) Exposed  Structure Necrotic Amount: Large (67-100%) Fascia Exposed: No Necrotic Quality: Eschar Fat Layer (Subcutaneous Tissue) Exposed: No Tendon Exposed: No Muscle Exposed: No Joint Exposed: No Bone Exposed: No Treatment Notes Wound #2 (Foot) Wound Laterality: Left, Lateral Cleanser Peri-Wound Care Topical Betadine Hagner, [name] ([id number]) Discharge Instruction: paint wound bed, allow to dry Primary Dressing Secondary Dressing Kerlix 4.5 x 4.1 (in/yd) Discharge Instruction: lightly Secured With Compression Wrap Compression Stockings Add-Ons Electronic Signature(s) Signed: 12/16/2021 8:01:43 AM By: [name] Coria RN Entered By: [name] on 12/13/2021 13:27:55 Sallade, [name] ([id number]) -------------------------------------------------------------------------------- Vitals Details Patient Name: Limbert, [name] Date of Service: 12/13/2021 1:15 PM Medical Record Number: [id number] Patient Account Number: [id number] Date of Birth/Sex: [birth year] (56 y.o. M) Treating RN: [name] Primary Care Christinna Sprung: [name] Other Clinician: Referring Sandra Brents: [name] Treating Brie Eppard/Extender: [name] in Treatment: 6 Vital Signs Time Taken: 13:25 Temperature (F): 97.9 Height (in): 70 Pulse (bpm): 72 Weight (lbs): 180.4 Respiratory Rate (breaths/min): 16 Body Mass Index (BMI): 25.9 Blood Pressure (mmHg): 126/72 Reference Range: 80 - 120 mg / dl Electronic Signature(s) Signed: 12/16/2021 8:01:43 AM By: [name] Coria RN Entered By: [name] on 12/13/2021 13:26:27

## 2021-12-17 ENCOUNTER — Encounter: Payer: Medicare Other | Admitting: Physician Assistant

## 2021-12-17 DIAGNOSIS — B957 Other staphylococcus as the cause of diseases classified elsewhere: Secondary | ICD-10-CM | POA: Diagnosis not present

## 2021-12-17 DIAGNOSIS — B952 Enterococcus as the cause of diseases classified elsewhere: Secondary | ICD-10-CM | POA: Diagnosis not present

## 2021-12-17 DIAGNOSIS — L97524 Non-pressure chronic ulcer of other part of left foot with necrosis of bone: Secondary | ICD-10-CM | POA: Diagnosis not present

## 2021-12-17 DIAGNOSIS — I871 Compression of vein: Secondary | ICD-10-CM | POA: Diagnosis not present

## 2021-12-17 DIAGNOSIS — E11621 Type 2 diabetes mellitus with foot ulcer: Secondary | ICD-10-CM | POA: Diagnosis not present

## 2021-12-17 DIAGNOSIS — N186 End stage renal disease: Secondary | ICD-10-CM | POA: Diagnosis not present

## 2021-12-17 DIAGNOSIS — Z4781 Encounter for orthopedic aftercare following surgical amputation: Secondary | ICD-10-CM | POA: Diagnosis not present

## 2021-12-17 DIAGNOSIS — I5042 Chronic combined systolic (congestive) and diastolic (congestive) heart failure: Secondary | ICD-10-CM | POA: Diagnosis not present

## 2021-12-17 DIAGNOSIS — M86172 Other acute osteomyelitis, left ankle and foot: Secondary | ICD-10-CM | POA: Diagnosis not present

## 2021-12-17 DIAGNOSIS — E1169 Type 2 diabetes mellitus with other specified complication: Secondary | ICD-10-CM | POA: Diagnosis not present

## 2021-12-17 DIAGNOSIS — Z992 Dependence on renal dialysis: Secondary | ICD-10-CM | POA: Diagnosis not present

## 2021-12-17 DIAGNOSIS — E1151 Type 2 diabetes mellitus with diabetic peripheral angiopathy without gangrene: Secondary | ICD-10-CM | POA: Diagnosis not present

## 2021-12-17 DIAGNOSIS — I110 Hypertensive heart disease with heart failure: Secondary | ICD-10-CM | POA: Diagnosis not present

## 2021-12-17 DIAGNOSIS — M86672 Other chronic osteomyelitis, left ankle and foot: Secondary | ICD-10-CM | POA: Diagnosis not present

## 2021-12-17 DIAGNOSIS — L03116 Cellulitis of left lower limb: Secondary | ICD-10-CM | POA: Diagnosis not present

## 2021-12-17 LAB — GLUCOSE, CAPILLARY
Glucose-Capillary: 233 mg/dL — ABNORMAL HIGH (ref 70–99)
Glucose-Capillary: 95 mg/dL (ref 70–99)

## 2021-12-17 NOTE — Progress Notes (Signed)
Cammarata, Rosine ([id number]) Visit Report for 12/17/2021 HBO Details Patient Name: STEDMAN, SUMMERVILLE. Date of Service: 12/17/2021 11:00 AM Medical Record Number: [id number] Patient Account Number: [id number] Date of Birth/Sex: 1966-04-15 (56 y.o. M) Treating RN: Donnamarie Poag Primary Care Madelena Maturin: [name] Other Clinician: Jacqulyn Bath Referring Pasha Gadison: [name] Treating Lorrine Killilea/Extender: [name] in Treatment: 6 HBO Treatment Course Details Treatment Course Number: 1 Ordering Shiheem Corporan: Jeri Cos Total Treatments Ordered: 40 HBO Treatment Start Date: 11/04/2021 HBO Indication: Chronic Refractory Osteomyelitis to Left Foot HBO Treatment Details Treatment Number: 27 Patient Type: Outpatient Chamber Type: Monoplace Chamber Serial #: [id number] Treatment Protocol: 2.0 ATA with 90 minutes oxygen, and no air breaks Treatment Details Compression Rate Down: 1.0 psi / minute De-Compression Rate Up: 1.5 psi / minute Compress Tx Pressure Air breaks and breathing periods Decompress Decompress Begins Reached (leave unused spaces blank) Begins Ends Chamber Pressure (ATA) 1 2 - - - - - - 2 1 Clock Time (24 hr) 11:44 12:00 - - - - - - 13:31 13:41 Treatment Length: 117 (minutes) Treatment Segments: 4 Vital Signs Capillary Blood Glucose Reference Range: 80 - 120 mg / dl HBO Diabetic Blood Glucose Intervention Range: <131 mg/dl or >249 mg/dl Time Capillary Blood Glucose Pulse Blood Respiratory Action Type: Vitals Pulse: Temperature: Glucose Meter Oximetry Pressure: Rate: Taken: Taken: (mg/dl): #: (%) Pre 11:30 132/78 72 16 98.2 233 1 none per protocol patient feels fine; no action Post 13:44 122/80 66 16 98.1 95 1 taken Treatment Response Treatment Toleration: Well Treatment Completion Treatment Completed without Adverse Event Status: Electronic Signature(s) Signed: 12/17/2021 2:00:50 PM By: Enedina Finner RCP, RRT, CHT Signed: 12/17/2021 4:37:47 PM By:  Worthy Keeler PA-C Entered By: Stark Jock, Amado Nash on 12/17/2021 13:59:17 Slayton, Loyde L. ([id number]) -------------------------------------------------------------------------------- HBO Safety Checklist Details Patient Name: Wigfall, Jacquise L. Date of Service: 12/17/2021 11:00 AM Medical Record Number: [id number] Patient Account Number: [id number] Date of Birth/Sex: [birth year] (56 y.o. M) Treating RN: Donnamarie Poag Primary Care Afsa Meany: [name] Other Clinician: Jacqulyn Bath Referring Ogle Hoeffner: [name] Treating Amiliana Foutz/Extender: [name] in Treatment: 6 HBO Safety Checklist Items Safety Checklist Consent Form Signed Patient voided / foley secured and emptied When did you last eato 10:30 am Last dose of injectable or oral agent n/a Ostomy pouch emptied and vented if applicable NA All implantable devices assessed, documented and approved NA Intravenous access site secured and place NA Valuables secured Linens and cotton and cotton/polyester blend (less than 51% polyester) Personal oil-based products / skin lotions / body lotions removed Wigs or hairpieces removed NA Smoking or tobacco materials removed NA Books / newspapers / magazines / loose paper removed NA Cologne, aftershave, perfume and deodorant removed Jewelry removed (may wrap wedding band) Make-up removed NA Hair care products removed Battery operated devices (external) removed NA Heating patches and chemical warmers removed NA Titanium eyewear removed NA Nail polish cured greater than 10 hours NA Casting material cured greater than 10 hours NA Hearing aids removed NA Loose dentures or partials removed NA Prosthetics have been removed NA Patient demonstrates correct use of air break device (if applicable) Patient concerns have been addressed Patient grounding bracelet on and cord attached to chamber Specifics for Inpatients (complete in addition to above) Medication sheet  sent with patient Intravenous medications needed or due during therapy sent with patient Drainage tubes (e.g. nasogastric tube or chest tube secured and vented) Endotracheal or Tracheotomy tube secured Cuff deflated of air and inflated with saline Airway suctioned Electronic  Signature(s) Signed: 12/17/2021 2:00:50 PM By: Enedina Finner RCP, RRT, CHT Entered By: Enedina Finner on 12/17/2021 12:05:31

## 2021-12-17 NOTE — Progress Notes (Signed)
Minks, Crossnore ([id number]) Visit Report for 12/17/2021 Arrival Information Details Patient Name: CLIFF, DAMIANI. Date of Service: 12/17/2021 11:00 AM Medical Record Number: [id number] Patient Account Number: [id number] Date of Birth/Sex: 1966/02/08 (56 y.o. M) Treating RN: Donnamarie Poag Primary Care Waneda Klammer: [name] Other Clinician: Jacqulyn Bath Referring Riccardo Holeman: [name] Treating Sergi Gellner/Extender: [name] in Treatment: 6 Visit Information History Since Last Visit Added or deleted any medications: No Patient Arrived: Ambulatory Any new allergies or adverse reactions: No Arrival Time: 11:25 Had a fall or experienced change in No Accompanied By: self activities of daily living that may affect Transfer Assistance: None risk of falls: Patient Identification Verified: Yes Signs or symptoms of abuse/neglect since last visito No Secondary Verification Process Completed: Yes Hospitalized since last visit: No Patient Requires Transmission-Based No Implantable device outside of the clinic excluding No Precautions: cellular tissue based products placed in the center Patient Has Alerts: Yes since last visit: Patient Alerts: Patient on Blood Has Dressing in Place as Prescribed: Yes Thinner Has Footwear/Offloading in Place as Prescribed: Yes DIALYSIS RIGHT ARM Left: Surgical Shoe with Pressure Aspirin 81/Brilinta Relief Insole Pain Present Now: No Electronic Signature(s) Signed: 12/17/2021 2:00:50 PM By: Enedina Finner RCP, RRT, CHT Entered By: Stark Jock, Amado Nash on 12/17/2021 11:51:19 Raphael, Daisuke L. ([id number]) -------------------------------------------------------------------------------- Encounter Discharge Information Details Patient Name: Rom, Keino L. Date of Service: 12/17/2021 11:00 AM Medical Record Number: [id number] Patient Account Number: [id number] Date of Birth/Sex: May 31, 1966 (56 y.o. M) Treating RN: Donnamarie Poag Primary Care  Bobbijo Holst: [name] Other Clinician: Jacqulyn Bath Referring Maham Quintin: [name] Treating Jimya Ciani/Extender: [name] in Treatment: 6 Encounter Discharge Information Items Discharge Condition: Stable Ambulatory Status: Ambulatory Discharge Destination: Home Transportation: Private Auto Accompanied By: self Schedule Follow-up Appointment: Yes Clinical Summary of Care: Notes Patient has an HBO treatment scheduled on 12/18/21 at 10:30 am. Electronic Signature(s) Signed: 12/17/2021 2:00:50 PM By: Enedina Finner RCP, RRT, CHT Entered By: Enedina Finner on 12/17/2021 14:00:23 Bentsen, Haneef L. ([id number]) -------------------------------------------------------------------------------- Vitals Details Patient Name: Maciejewski, Fabrizio L. Date of Service: 12/17/2021 11:00 AM Medical Record Number: [id number] Patient Account Number: [id number] Date of Birth/Sex: November 22, 1965 (56 y.o. M) Treating RN: Donnamarie Poag Primary Care [name]: [name] Other Clinician: Jacqulyn Bath Referring Raymondo Garcialopez: [name] Treating Sharesa [name]/Extender: [name] in Treatment: 6 Vital Signs Time Taken: 11:30 Temperature (F): 98.2 Height (in): 70 Pulse (bpm): 72 Weight (lbs): 180.4 Respiratory Rate (breaths/min): 16 Body Mass Index (BMI): 25.9 Blood Pressure (mmHg): 132/78 Capillary Blood Glucose (mg/dl): 233 Reference Range: 80 - 120 mg / dl Electronic Signature(s) Signed: 12/17/2021 2:00:50 PM By: Enedina Finner RCP, RRT, CHT Entered By: Enedina Finner on 12/17/2021 11:52:21

## 2021-12-18 ENCOUNTER — Encounter: Payer: Medicare Other | Attending: Internal Medicine | Admitting: Internal Medicine

## 2021-12-18 ENCOUNTER — Other Ambulatory Visit: Payer: Self-pay

## 2021-12-18 DIAGNOSIS — M86672 Other chronic osteomyelitis, left ankle and foot: Secondary | ICD-10-CM | POA: Insufficient documentation

## 2021-12-18 DIAGNOSIS — E11621 Type 2 diabetes mellitus with foot ulcer: Secondary | ICD-10-CM | POA: Insufficient documentation

## 2021-12-18 DIAGNOSIS — L97524 Non-pressure chronic ulcer of other part of left foot with necrosis of bone: Secondary | ICD-10-CM | POA: Insufficient documentation

## 2021-12-18 DIAGNOSIS — I110 Hypertensive heart disease with heart failure: Secondary | ICD-10-CM | POA: Diagnosis not present

## 2021-12-18 DIAGNOSIS — I5042 Chronic combined systolic (congestive) and diastolic (congestive) heart failure: Secondary | ICD-10-CM | POA: Diagnosis not present

## 2021-12-18 DIAGNOSIS — N2581 Secondary hyperparathyroidism of renal origin: Secondary | ICD-10-CM | POA: Diagnosis not present

## 2021-12-18 DIAGNOSIS — I2510 Atherosclerotic heart disease of native coronary artery without angina pectoris: Secondary | ICD-10-CM | POA: Diagnosis not present

## 2021-12-18 DIAGNOSIS — D631 Anemia in chronic kidney disease: Secondary | ICD-10-CM | POA: Diagnosis not present

## 2021-12-18 DIAGNOSIS — N186 End stage renal disease: Secondary | ICD-10-CM | POA: Diagnosis not present

## 2021-12-18 DIAGNOSIS — E1129 Type 2 diabetes mellitus with other diabetic kidney complication: Secondary | ICD-10-CM | POA: Diagnosis not present

## 2021-12-18 DIAGNOSIS — Z992 Dependence on renal dialysis: Secondary | ICD-10-CM | POA: Diagnosis not present

## 2021-12-18 LAB — GLUCOSE, CAPILLARY
Glucose-Capillary: 124 mg/dL — ABNORMAL HIGH (ref 70–99)
Glucose-Capillary: 100 mg/dL — ABNORMAL HIGH (ref 70–99)

## 2021-12-18 NOTE — Progress Notes (Signed)
[name], [name] ([id number]) ?Visit Report for 12/18/2021 ?Arrival Information Details ?Patient Name: [name], [name] ?Date of Service: 12/18/2021 10:30 AM ?Medical Record Number: [id number] ?Patient Account Number: [id number] ?Date of Birth/Sex: 01/23/1966 (56 y.o. M) ?Treating RN: Donnamarie Poag ?Primary Care Gicela Schwarting: [name] ?Other Clinician: Jacqulyn Bath ?Referring Holly Iannaccone: [name] ?Treating Tymere Depuy/Extender: Kalman Shan ?Weeks in Treatment: 6 ?Visit Information History Since Last Visit ?Added or deleted any medications: No ?Patient Arrived: Ambulatory ?Any new allergies or adverse reactions: No ?Arrival Time: 10:30 ?Had a fall or experienced change in No ?Accompanied By: self ?activities of daily living that may affect ?Transfer Assistance: None ?risk of falls: ?Patient Identification Verified: Yes ?Signs or symptoms of abuse/neglect since last visito No ?Secondary Verification Process Completed: Yes ?Hospitalized since last visit: No ?Patient Requires Transmission-Based No ?Implantable device outside of the clinic excluding No ?Precautions: ?cellular tissue based products placed in the center ?Patient Has Alerts: Yes ?since last visit: ?Patient Alerts: Patient on Blood ?Has Dressing in Place as Prescribed: Yes ?Thinner ?Has Footwear/Offloading in Place as Prescribed: Yes ?DIALYSIS RIGHT ARM ?Left: Surgical Shoe with Pressure ?Aspirin 81/Brilinta ?Relief Insole ?Pain Present Now: No ?Electronic Signature(s) ?Signed: 12/18/2021 1:12:44 PM By: Enedina Finner RCP, RRT, CHT ?Entered By: Enedina Finner on 12/18/2021 11:06:07 ?[name], [name] ([id number]) ?-------------------------------------------------------------------------------- ?Encounter Discharge Information Details ?Patient Name: [name], [name] ?Date of Service: 12/18/2021 10:30 AM ?Medical Record Number: [id number] ?Patient Account Number: [id number] ?Date of Birth/Sex: [birth year] (56 y.o. M) ?Treating RN: Donnamarie Poag ?Primary  Care Liany Mumpower: [name] ?Other Clinician: Jacqulyn Bath ?Referring Davis Vannatter: [name] ?Treating Rourke Mcquitty/Extender: Kalman Shan ?Weeks in Treatment: 6 ?Encounter Discharge Information Items ?Discharge Condition: Stable ?Ambulatory Status: Ambulatory ?Discharge Destination: Home ?Transportation: Private Auto ?Accompanied By: self ?Schedule Follow-up Appointment: Yes ?Clinical Summary of Care: ?Notes ?Patient has an HBO treatment scheduled on 12/19/21 at 9:00 am. ?Electronic Signature(s) ?Signed: 12/18/2021 1:12:44 PM By: Enedina Finner RCP, RRT, CHT ?Entered By: Enedina Finner on 12/18/2021 13:11:40 ?[name], [name] ([id number]) ?-------------------------------------------------------------------------------- ?Vitals Details ?Patient Name: [name], [name] ?Date of Service: 12/18/2021 10:30 AM ?Medical Record Number: [id number] ?Patient Account Number: [id number] ?Date of Birth/Sex: December 05, 1965 (56 y.o. M) ?Treating RN: Donnamarie Poag ?Primary Care Mirielle Byrum: [name] ?Other Clinician: Jacqulyn Bath ?Referring Fielding Mault: [name] ?Treating Yecheskel Kurek/Extender: Kalman Shan ?Weeks in Treatment: 6 ?Vital Signs ?Time Taken: 10:31 ?Temperature (??F): 98.5 ?Height (in): 70 ?Pulse (bpm): 78 ?Weight (lbs): 180.4 ?Respiratory Rate (breaths/min): 16 ?Body Mass Index (BMI): 25.9 ?Blood Pressure (mmHg): 122/72 ?Capillary Blood Glucose (mg/dl): 124 ?Reference Range: 80 - 120 mg / dl ?Electronic Signature(s) ?Signed: 12/18/2021 1:12:44 PM By: Enedina Finner RCP, RRT, CHT ?Entered By: Enedina Finner on 12/18/2021 11:06:42 ?

## 2021-12-18 NOTE — Progress Notes (Signed)
Mees, Colerain ([id number]) ?Visit Report for 12/18/2021 ?HBO Details ?Patient Name: [name], [name] ?Date of Service: 12/18/2021 10:30 AM ?Medical Record Number: [id number] ?Patient Account Number: [id number] ?Date of Birth/Sex: [birth year] (56 y.o. M) ?Treating RN: Donnamarie Poag ?Primary Care Kenden Brandt: [name] ?Other Clinician: Jacqulyn Bath ?Referring Christiona Siddique: [name] ?Treating Jarett Dralle/Extender: Kalman Shan ?Weeks in Treatment: 6 ?HBO Treatment Course Details ?Treatment Course Number: 1 ?Ordering Thressa Shiffer: Jeri Cos ?Total Treatments Ordered: 40 ?HBO Treatment Start Date: 11/04/2021 ?HBO Indication: ?Chronic Refractory Osteomyelitis to Left Foot ?HBO Treatment Details ?Treatment Number: 28 ?Patient Type: Outpatient ?Chamber Type: Monoplace ?Chamber Serial #: [id number] ?Treatment Protocol: 2.0 ATA with 90 minutes oxygen, and no air breaks ?Treatment Details ?Compression Rate Down: 1.0 psi / minute ?De-Compression Rate Up: 1.5 psi / minute ?Compress Tx Pressure Air breaks and breathing periods Decompress Decompress ?Begins Reached (leave unused spaces blank) Begins Ends ?Chamber Pressure (ATA) 1 2 - - - - - - 2 1 ?Clock Time (24 hr) 10:47 11:03 - - - - - - 12:34 12:44 ?Treatment Length: 117 (minutes) ?Treatment Segments: 4 ?Vital Signs ?Capillary Blood Glucose Reference Range: 80 - 120 mg / dl ?HBO Diabetic Blood Glucose Intervention Range: <131 mg/dl or >249 mg/dl ?Time Vitals Blood Respiratory Capillary Blood Glucose Pulse Action ?Type: ?Pulse: Temperature: ?Taken: ?Pressure: ?Rate: ?Glucose (mg/dl): ?Meter #: Oximetry (%) Taken: ?Pre 10:31 122/72 78 16 98.5 124 1 none per protocol ?Post 12:48 122/70 78 16 98 100 1 none per protocol ?Treatment Response ?Treatment Toleration: Well ?Treatment Completion ?Treatment Completed without Adverse Event ?Status: ?HBO Attestation ?I certify that I supervised this HBO treatment in accordance with ?Medicare guidelines. A trained emergency response team is readily  Yes ?available per hospital policies and procedures. ?Continue HBOT as ordered. Yes ?Electronic Signature(s) ?Signed: 12/18/2021 2:01:58 PM By: Kalman Shan DO ?Previous Signature: 12/18/2021 1:12:44 PM Version By: Enedina Finner RCP, RRT, CHT ?Entered By: Kalman Shan on 12/18/2021 14:01:06 ?Pagaduan, Bantam ([id number]) ?-------------------------------------------------------------------------------- ?HBO Safety Checklist Details ?Patient Name: [name], [name] ?Date of Service: 12/18/2021 10:30 AM ?Medical Record Number: [id number] ?Patient Account Number: [id number] ?Date of Birth/Sex: 23-Nov-1965 (56 y.o. M) ?Treating RN: Donnamarie Poag ?Primary Care Vung Kush: [name] ?Other Clinician: Jacqulyn Bath ?Referring Hernandez Losasso: [name] ?Treating Baylen Dea/Extender: Kalman Shan ?Weeks in Treatment: 6 ?HBO Safety Checklist Items ?Safety Checklist ?Consent Form Signed ?Patient voided / foley secured and emptied ?When did you last eato 10:00 am ?Last dose of injectable or oral agent n/a ?Ostomy pouch emptied and vented if applicable ?NA ?All implantable devices assessed, documented and approved ?NA ?Intravenous access site secured and place ?NA ?Valuables secured ?Linens and cotton and cotton/polyester blend (less than 51% polyester) ?Personal oil-based products / skin lotions / body lotions removed ?Wigs or hairpieces removed ?NA ?Smoking or tobacco materials removed ?NA ?Books / newspapers / magazines / loose paper removed ?NA ?Cologne, aftershave, perfume and deodorant removed ?Jewelry removed (may wrap wedding band) ?Make-up removed ?NA ?Hair care products removed ?Battery operated devices (external) removed ?NA ?Heating patches and chemical warmers removed ?NA ?Titanium eyewear removed ?NA ?Nail polish cured greater than 10 hours ?NA ?Casting material cured greater than 10 hours ?NA ?Hearing aids removed ?NA ?Loose dentures or partials removed ?NA ?Prosthetics have been removed ?NA ?Patient  demonstrates correct use of air break device (if applicable) ?Patient concerns have been addressed ?Patient grounding bracelet on and cord attached to chamber ?Specifics for Inpatients (complete in addition to ?above) ?Medication sheet sent with patient ?Intravenous medications needed or due during  therapy sent with patient ?Drainage tubes (e.g. nasogastric tube or chest tube secured and vented) ?Endotracheal or Tracheotomy tube secured ?Cuff deflated of air and inflated with saline ?Airway suctioned ?Electronic Signature(s) ?Signed: 12/18/2021 1:12:44 PM By: Enedina Finner RCP, RRT, CHT ?Entered By: Enedina Finner on 12/18/2021 11:07:50 ?

## 2021-12-19 ENCOUNTER — Encounter: Payer: Medicare Other | Admitting: Physician Assistant

## 2021-12-19 DIAGNOSIS — M86672 Other chronic osteomyelitis, left ankle and foot: Secondary | ICD-10-CM | POA: Diagnosis not present

## 2021-12-19 DIAGNOSIS — I5042 Chronic combined systolic (congestive) and diastolic (congestive) heart failure: Secondary | ICD-10-CM | POA: Diagnosis not present

## 2021-12-19 DIAGNOSIS — I2510 Atherosclerotic heart disease of native coronary artery without angina pectoris: Secondary | ICD-10-CM | POA: Diagnosis not present

## 2021-12-19 DIAGNOSIS — E11621 Type 2 diabetes mellitus with foot ulcer: Secondary | ICD-10-CM | POA: Diagnosis not present

## 2021-12-19 DIAGNOSIS — L97524 Non-pressure chronic ulcer of other part of left foot with necrosis of bone: Secondary | ICD-10-CM | POA: Diagnosis not present

## 2021-12-19 DIAGNOSIS — I110 Hypertensive heart disease with heart failure: Secondary | ICD-10-CM | POA: Diagnosis not present

## 2021-12-19 LAB — GLUCOSE, CAPILLARY
Glucose-Capillary: 185 mg/dL — ABNORMAL HIGH (ref 70–99)
Glucose-Capillary: 183 mg/dL — ABNORMAL HIGH (ref 70–99)

## 2021-12-19 NOTE — Progress Notes (Signed)
Wiley, [place] ([id number]) ?Visit Report for 12/19/2021 ?HBO Details ?Patient Name: [name], [name] ?Date of Service: 12/19/2021 9:00 AM ?Medical Record Number: [id number] ?Patient Account Number: [id number] ?Date of Birth/Sex: 04-11-1966 (56 y.o. M) ?Treating RN: [name] ?Primary Care Reshma Hoey: [name] ?Other Clinician: Jacqulyn Bath ?Referring Colin Ellers: [name] ?Treating Paylin Hailu/Extender: Jeri Cos ?Weeks in Treatment: 7 ?HBO Treatment Course Details ?Treatment Course Number: 1 ?Ordering Ajax Schroll: Jeri Cos ?Total Treatments Ordered: 40 ?HBO Treatment Start Date: 11/04/2021 ?HBO Indication: ?Chronic Refractory Osteomyelitis to Left Foot ?HBO Treatment Details ?Treatment Number: 29 ?Patient Type: Outpatient ?Chamber Type: Monoplace ?Chamber Serial #: [id number] ?Treatment Protocol: 2.0 ATA with 90 minutes oxygen, and no air breaks ?Treatment Details ?Compression Rate Down: 1.0 psi / minute ?De-Compression Rate Up: 1.5 psi / minute ?Compress Tx Pressure Air breaks and breathing periods Decompress Decompress ?Begins Reached (leave unused spaces blank) Begins Ends ?Chamber Pressure (ATA) 1 2 - - - - - - 2 1 ?Clock Time (24 hr) 08:19 08:36 - - - - - - 10:07 10:16 ?Treatment Length: 117 (minutes) ?Treatment Segments: 4 ?Vital Signs ?Capillary Blood Glucose Reference Range: 80 - 120 mg / dl ?HBO Diabetic Blood Glucose Intervention Range: <131 mg/dl or >249 mg/dl ?Time Vitals Blood Respiratory Capillary Blood Glucose Pulse Action ?Type: ?Pulse: Temperature: ?Taken: ?Pressure: ?Rate: ?Glucose (mg/dl): ?Meter #: Oximetry (%) Taken: ?Pre 08:08 142/74 60 16 98 185 1 none per protocol ?Post 10:20 142/82 66 16 98.4 183 1 none per protocol ?Treatment Response ?Treatment Toleration: Well ?Treatment Completion ?Treatment Completed without Adverse Event ?Status: ?Electronic Signature(s) ?Signed: 12/19/2021 11:34:16 AM By: Enedina Finner RCP, RRT, CHT ?Signed: 12/19/2021 5:22:07 PM By: Worthy Keeler  PA-C ?Entered By: Enedina Finner on 12/19/2021 10:34:48 ?[name], [name] ([id number]) ?-------------------------------------------------------------------------------- ?HBO Safety Checklist Details ?Patient Name: [name], [name] ?Date of Service: 12/19/2021 9:00 AM ?Medical Record Number: [id number] ?Patient Account Number: [id number] ?Date of Birth/Sex: Sep 30, 1966 (56 y.o. M) ?Treating RN: [name] ?Primary Care Elowyn Raupp: [name] ?Other Clinician: Jacqulyn Bath ?Referring Lexia Vandevender: [name] ?Treating Herlinda Heady/Extender: Jeri Cos ?Weeks in Treatment: 7 ?HBO Safety Checklist Items ?Safety Checklist ?Consent Form Signed ?Patient voided / foley secured and emptied ?When did you last eato 07:00 am ?Last dose of injectable or oral agent n/a ?Ostomy pouch emptied and vented if applicable ?NA ?All implantable devices assessed, documented and approved ?NA ?Intravenous access site secured and place ?NA ?Valuables secured ?Linens and cotton and cotton/polyester blend (less than 51% polyester) ?Personal oil-based products / skin lotions / body lotions removed ?Wigs or hairpieces removed ?NA ?Smoking or tobacco materials removed ?NA ?Books / newspapers / magazines / loose paper removed ?NA ?Cologne, aftershave, perfume and deodorant removed ?Jewelry removed (may wrap wedding band) ?Make-up removed ?NA ?Hair care products removed ?Battery operated devices (external) removed ?NA ?Heating patches and chemical warmers removed ?NA ?Titanium eyewear removed ?NA ?Nail polish cured greater than 10 hours ?NA ?Casting material cured greater than 10 hours ?NA ?Hearing aids removed ?NA ?Loose dentures or partials removed ?NA ?Prosthetics have been removed ?NA ?Patient demonstrates correct use of air break device (if applicable) ?Patient concerns have been addressed ?Patient grounding bracelet on and cord attached to chamber ?Specifics for Inpatients (complete in addition to ?above) ?Medication sheet sent with  patient ?Intravenous medications needed or due during therapy sent with patient ?Drainage tubes (e.g. nasogastric tube or chest tube secured and vented) ?Endotracheal or Tracheotomy tube secured ?Cuff deflated of air and inflated with saline ?Airway suctioned ?Electronic Signature(s) ?Signed: 12/19/2021  11:34:16 AM By: Enedina Finner RCP, RRT, CHT ?Entered By: Enedina Finner on 12/19/2021 08:34:14 ?

## 2021-12-19 NOTE — Progress Notes (Signed)
[name], [name] ([id number]) ?Visit Report for 12/19/2021 ?Arrival Information Details ?Patient Name: [name], [name] ?Date of Service: 12/19/2021 9:00 AM ?Medical Record Number: [id number] ?Patient Account Number: [id number] ?Date of Birth/Sex: Oct 24, 1965 (56 y.o. M) ?Treating RN: [name] ?Primary Care Mariene Dickerman: [name] ?Other Clinician: Jacqulyn Bath ?Referring Emmett Arntz: [name] ?Treating Marliyah Reid/Extender: Jeri Cos ?Weeks in Treatment: 7 ?Visit Information History Since Last Visit ?Added or deleted any medications: No ?Patient Arrived: Ambulatory ?Any new allergies or adverse reactions: No ?Arrival Time: 08:00 ?Had a fall or experienced change in No ?Accompanied By: self ?activities of daily living that may affect ?Transfer Assistance: None ?risk of falls: ?Patient Identification Verified: Yes ?Signs or symptoms of abuse/neglect since last visito No ?Secondary Verification Process Completed: Yes ?Hospitalized since last visit: No ?Patient Requires Transmission-Based No ?Implantable device outside of the clinic excluding No ?Precautions: ?cellular tissue based products placed in the center ?Patient Has Alerts: Yes ?since last visit: ?Patient Alerts: Patient on Blood ?Has Dressing in Place as Prescribed: Yes ?Thinner ?Has Footwear/Offloading in Place as Prescribed: Yes ?DIALYSIS RIGHT ARM ?Left: Surgical Shoe with Pressure ?Aspirin 81/Brilinta ?Relief Insole ?Pain Present Now: No ?Electronic Signature(s) ?Signed: 12/19/2021 11:34:16 AM By: Enedina Finner RCP, RRT, CHT ?Entered By: Enedina Finner on 12/19/2021 37:85:88 ?[name], [name] ([id number]) ?-------------------------------------------------------------------------------- ?Encounter Discharge Information Details ?Patient Name: [name], [name] ?Date of Service: 12/19/2021 9:00 AM ?Medical Record Number: [id number] ?Patient Account Number: [id number] ?Date of Birth/Sex: [birth year] (56 y.o. M) ?Treating RN: [name] ?Primary Care  Taisia Fantini: [name] ?Other Clinician: Jacqulyn Bath ?Referring Carolena Fairbank: [name] ?Treating Bertice Risse/Extender: Jeri Cos ?Weeks in Treatment: 7 ?Encounter Discharge Information Items ?Discharge Condition: Stable ?Ambulatory Status: Kasandra Knudsen ?Discharge Destination: Home ?Transportation: Private Auto ?Accompanied By: self ?Schedule Follow-up Appointment: Yes ?Clinical Summary of Care: ?Notes ?Patient has an HBO treatment scheduled on 12/20/21 at 10:30 am. ?Electronic Signature(s) ?Signed: 12/19/2021 11:34:16 AM By: Enedina Finner RCP, RRT, CHT ?Entered By: Enedina Finner on 12/19/2021 10:35:45 ?[name], [name] ([id number]) ?-------------------------------------------------------------------------------- ?Vitals Details ?Patient Name: [name], [name] ?Date of Service: 12/19/2021 9:00 AM ?Medical Record Number: [id number] ?Patient Account Number: [id number] ?Date of Birth/Sex: [birth year] (56 y.o. M) ?Treating RN: [name] ?Primary Care Ekansh Sherk: [name] ?Other Clinician: Jacqulyn Bath ?Referring Nameer Summer: [name] ?Treating Kadelyn Dimascio/Extender: Jeri Cos ?Weeks in Treatment: 7 ?Vital Signs ?Time Taken: 08:08 ?Temperature (??F): 98.0 ?Height (in): 70 ?Pulse (bpm): 60 ?Weight (lbs): 180.4 ?Respiratory Rate (breaths/min): 16 ?Body Mass Index (BMI): 25.9 ?Blood Pressure (mmHg): 142/74 ?Capillary Blood Glucose (mg/dl): 185 ?Reference Range: 80 - 120 mg / dl ?Electronic Signature(s) ?Signed: 12/19/2021 11:34:16 AM By: Enedina Finner RCP, RRT, CHT ?Entered By: Enedina Finner on 12/19/2021 08:30:56 ?

## 2021-12-20 ENCOUNTER — Encounter: Payer: Medicare Other | Admitting: Physician Assistant

## 2021-12-20 ENCOUNTER — Other Ambulatory Visit: Payer: Self-pay

## 2021-12-20 DIAGNOSIS — T8189XA Other complications of procedures, not elsewhere classified, initial encounter: Secondary | ICD-10-CM | POA: Diagnosis not present

## 2021-12-20 DIAGNOSIS — N2581 Secondary hyperparathyroidism of renal origin: Secondary | ICD-10-CM | POA: Diagnosis not present

## 2021-12-20 DIAGNOSIS — N186 End stage renal disease: Secondary | ICD-10-CM | POA: Diagnosis not present

## 2021-12-20 DIAGNOSIS — L98492 Non-pressure chronic ulcer of skin of other sites with fat layer exposed: Secondary | ICD-10-CM | POA: Diagnosis not present

## 2021-12-20 DIAGNOSIS — Z992 Dependence on renal dialysis: Secondary | ICD-10-CM | POA: Diagnosis not present

## 2021-12-20 DIAGNOSIS — D631 Anemia in chronic kidney disease: Secondary | ICD-10-CM | POA: Diagnosis not present

## 2021-12-20 DIAGNOSIS — I2510 Atherosclerotic heart disease of native coronary artery without angina pectoris: Secondary | ICD-10-CM | POA: Diagnosis not present

## 2021-12-20 DIAGNOSIS — L97524 Non-pressure chronic ulcer of other part of left foot with necrosis of bone: Secondary | ICD-10-CM | POA: Diagnosis not present

## 2021-12-20 DIAGNOSIS — E11621 Type 2 diabetes mellitus with foot ulcer: Secondary | ICD-10-CM | POA: Diagnosis not present

## 2021-12-20 DIAGNOSIS — I110 Hypertensive heart disease with heart failure: Secondary | ICD-10-CM | POA: Diagnosis not present

## 2021-12-20 DIAGNOSIS — M86672 Other chronic osteomyelitis, left ankle and foot: Secondary | ICD-10-CM | POA: Diagnosis not present

## 2021-12-20 DIAGNOSIS — I5042 Chronic combined systolic (congestive) and diastolic (congestive) heart failure: Secondary | ICD-10-CM | POA: Diagnosis not present

## 2021-12-20 LAB — GLUCOSE, CAPILLARY
Glucose-Capillary: 123 mg/dL — ABNORMAL HIGH (ref 70–99)
Glucose-Capillary: 155 mg/dL — ABNORMAL HIGH (ref 70–99)

## 2021-12-20 NOTE — Progress Notes (Signed)
Lords, [place] ([id number]) ?Visit Report for 12/20/2021 ?Arrival Information Details ?Patient Name: [name], [name] ?Date of Service: 12/20/2021 10:30 AM ?Medical Record Number: [id number] ?Patient Account Number: [id number] ?Date of Birth/Sex: 1966/09/25 (56 y.o. M) ?Treating RN: Donnamarie Poag ?Primary Care Elga Santy: [name] ?Other Clinician: Jacqulyn Bath ?Referring Sabree Nuon: [name] ?Treating Kingslee Dowse/Extender: Jeri Cos ?Weeks in Treatment: 7 ?Visit Information History Since Last Visit ?Added or deleted any medications: No ?Patient Arrived: Ambulatory ?Any new allergies or adverse reactions: No ?Arrival Time: 10:15 ?Had a fall or experienced change in No ?Accompanied By: self ?activities of daily living that may affect ?Transfer Assistance: None ?risk of falls: ?Patient Identification Verified: Yes ?Signs or symptoms of abuse/neglect since last visito No ?Secondary Verification Process Completed: Yes ?Hospitalized since last visit: No ?Patient Requires Transmission-Based No ?Implantable device outside of the clinic excluding No ?Precautions: ?cellular tissue based products placed in the center ?Patient Has Alerts: Yes ?since last visit: ?Patient Alerts: Patient on Blood ?Has Dressing in Place as Prescribed: Yes ?Thinner ?Has Footwear/Offloading in Place as Prescribed: Yes ?DIALYSIS RIGHT ARM ?Left: Surgical Shoe with Pressure ?Aspirin 81/Brilinta ?Relief Insole ?Pain Present Now: No ?Electronic Signature(s) ?Signed: 12/20/2021 12:59:58 PM By: Enedina Finner RCP, RRT, CHT ?Entered By: Enedina Finner on 12/20/2021 10:32:46 ?[place], [place] ([id number]) ?-------------------------------------------------------------------------------- ?Encounter Discharge Information Details ?Patient Name: [name], [name] ?Date of Service: 12/20/2021 10:30 AM ?Medical Record Number: [id number] ?Patient Account Number: [id number] ?Date of Birth/Sex: [birth year] (56 y.o. M) ?Treating RN: Donnamarie Poag ?Primary Care  Eldredge Veldhuizen: [name] ?Other Clinician: Jacqulyn Bath ?Referring August Longest: [name] ?Treating Matson Welch/Extender: Jeri Cos ?Weeks in Treatment: 7 ?Encounter Discharge Information Items ?Discharge Condition: Stable ?Ambulatory Status: Ambulatory ?Discharge Destination: Home ?Transportation: Private Auto ?Accompanied By: self ?Schedule Follow-up Appointment: Yes ?Clinical Summary of Care: ?Notes ?Patient has an HBO treatment scheduled on 12/23/21 at 10:30 am. ?Electronic Signature(s) ?Signed: 12/20/2021 12:59:58 PM By: Enedina Finner RCP, RRT, CHT ?Entered By: Enedina Finner on 12/20/2021 12:59:15 ?[place], [place] ([id number]) ?-------------------------------------------------------------------------------- ?Vitals Details ?Patient Name: [name], [name] ?Date of Service: 12/20/2021 10:30 AM ?Medical Record Number: [id number] ?Patient Account Number: [id number] ?Date of Birth/Sex: 1965-12-06 (56 y.o. M) ?Treating RN: Donnamarie Poag ?Primary Care Artemis Koller: [name] ?Other Clinician: Jacqulyn Bath ?Referring Shannia Jacuinde: [name] ?Treating Krithi Bray/Extender: Jeri Cos ?Weeks in Treatment: 7 ?Vital Signs ?Time Taken: 10:18 ?Temperature (??F): 98.7 ?Height (in): 70 ?Pulse (bpm): 72 ?Weight (lbs): 180.4 ?Respiratory Rate (breaths/min): 16 ?Body Mass Index (BMI): 25.9 ?Blood Pressure (mmHg): 128/78 ?Capillary Blood Glucose (mg/dl): 123 ?Reference Range: 80 - 120 mg / dl ?Electronic Signature(s) ?Signed: 12/20/2021 12:59:58 PM By: Enedina Finner RCP, RRT, CHT ?Entered By: Enedina Finner on 12/20/2021 10:33:23 ?

## 2021-12-20 NOTE — Progress Notes (Addendum)
[name], [name] ([id number]) Visit Report for 12/20/2021 Chief Complaint Document Details Patient Name: [name] [name] CELESTINE. Date of Service: 12/20/2021 1:15 PM Medical Record Number: [id number] Patient Account Number: [id number] Date of Birth/Sex: November 12, 1965 (56 y.o. M) Treating RN: [name] Primary Care Provider: [name] Other Clinician: Referring Provider: [name] Treating Provider/Extender: [name] in Treatment: 7 Information Obtained from: Patient Chief Complaint HBO evaluation for treatment on the left foot diabetic ulcer with osteomyelitis. Patient is currently under our care as well for his foot. Electronic Signature(s) Signed: 12/20/2021 1:17:37 PM By: Worthy Keeler PA-C Entered By: Worthy Keeler on 12/20/2021 13:17:37 [name], [name] ([id number]) -------------------------------------------------------------------------------- Debridement Details Patient Name: [name] [name] L. Date of Service: 12/20/2021 1:15 PM Medical Record Number: [id number] Patient Account Number: [id number] Date of Birth/Sex: 10/12/1966 (56 y.o. M) Treating RN: [name] Primary Care Provider: [name] Other Clinician: Referring Provider: [name] Treating Provider/Extender: [name] in Treatment: 7 Debridement Performed for Wound #1 Left Amputation Site - Digit Assessment: Performed By: Physician [name] [name]., PA-C Debridement Type: Debridement Level of Consciousness (Pre- Awake and Alert procedure): Pre-procedure Verification/Time Out Yes - 13:20 Taken: Start Time: 13:20 Total Area Debrided (L x W): 5.2 (cm) x 2.2 (cm) = 11.44 (cm) Tissue and other material Viable, Non-Viable, Slough, Subcutaneous, Slough debrided: Level: Skin/Subcutaneous Tissue Debridement Description: Excisional Instrument: Curette Bleeding: Moderate Hemostasis Achieved: Pressure End Time: 13:30 Procedural Pain: 0 Post Procedural Pain: 0 Response to Treatment: Procedure was  tolerated well Level of Consciousness (Post- Awake and Alert procedure): Post Debridement Measurements of Total Wound Length: (cm) 5.2 Width: (cm) 2.2 Depth: (cm) 1.7 Volume: (cm) 15.274 Character of Wound/Ulcer Post Debridement: Improved Post Procedure Diagnosis Same as Pre-procedure Electronic Signature(s) Signed: 12/20/2021 1:46:31 PM By: [name] RN Signed: 12/20/2021 5:14:00 PM By: Worthy Keeler PA-C Entered By: [name] on 12/20/2021 13:25:58 [name] [name] L. ([id number]) -------------------------------------------------------------------------------- HPI Details Patient Name: [name] [name] L. Date of Service: 12/20/2021 1:15 PM Medical Record Number: [id number] Patient Account Number: [id number] Date of Birth/Sex: 1966-03-24 (56 y.o. M) Treating RN: [name] Primary Care Provider: [name] Other Clinician: Referring Provider: [name] Treating Provider/Extender: [name] in Treatment: 7 History of Present Illness HPI Description: 10/31/2021 patient presents today for evaluation here in our clinic. He is actually being evaluated for hyperbaric oxygen therapy only. He has been seen at [facility] up to this point he is under the care of the vascular surgery specialty. Subsequently he is also under the care of the hyperbaric center there. With that being said that due to the fact that he is a dialysis patient he was actually recommended to be transferred to [place] for his treatments he actually lives in [place] which is where his dialysis is. It was a hardship for him to try to get from [place] especially on dialysis days all the way to [facility] get into the facility for his treatment and get back home he was literally spending all day long. He has had just 4 treatments there at [facility] thus far based on what I see in these have not been continued while he awaits approval here in our clinic. Subsequently I do have records for review that will be  included in the HPI predetermination review and attached to this chart as well. I will not duplicate that here. 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 Nonetheless he continues to have exposed bone with marked soft tissue swelling compatible with osteomyelitis. This is due to the irregularity of the head of the metatarsal as well. Nonetheless along with having associated cellulitis he is also good to be seen by infectious disease and is currently on antibiotics for this as well. Again that will be detailed in the pretreatment review section which I will attach to this note as well. He does currently have a wound VAC in place and again the wound was not evaluated here in the clinic as that is being completely managed by home health and [facility]. The patient does have a history of diabetes mellitus type 2, hypertension, coronary artery disease, and congestive heart failure. He also has cataracts of both eyes but no evidence of glaucoma on his most recent eye exam. 11/22/2021 we have been seeing this gentleman for hyperbaric oxygen therapy but have not actually evaluated his wound up to this point this was being managed by the wound care and vascular team I presumed at [facility] at least that is what has been told to be previous. Nonetheless at this point I got a call from Atlee Abide who is a Librarian, academic at the [facility] vascular clinic with Dr. Durene Fruits and subsequently they were wanting to know if I could take over wound care for this patient. Apparently they do not have the ability for an office debridement which again I completely understand but nonetheless definitely is not an integral part of his healing along with the hyperbarics. Subsequently I discussed with the patient today that I do believe he would benefit going ahead and proceeding  with evaluation here in the clinic for that reason I did go ahead and see him today to get things started. There is also been some confusion about the issues with his home health nurse and getting measurements to [facility] for the wound VAC in order to continue his wound VAC therapy. With that being said after I see him today we will make a determination on what to do with wound VAC we can definitely send measurements to [facility] but again the bigger question is whether this is the best way to go or not. 11/28/2021 upon evaluation today patient appears to be doing decently well in regard to his wound. He has been tolerating the dressing changes with the Dakin's moistened gauze dressing which I think is doing a good job. Fortunately I do not see any signs of active infection locally nor systemically at this point. In general I think that he is actually making excellent progress which is great news. 2/17; seen in conjunction with HBO today. 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 Much improved condition of the wound surface. On the lateral foot and eschared area that he has been using Betadine. Tolerating HBO well 12/13/2021 upon evaluation today patient appears to be doing pretty well in regard to his foot ulcer. I am actually much more pleased currently that I been with the way the appearance looks today. The wound bed does show some signs of slough and biofilm on the surface of the wound this is minimal I Minna perform debridement the I will do so very carefully due to the fact that to be honest the patient had a significant amount of bleeding in the past being on the Brilinta he had a difficult time clotting when I debrided him previously  quite significantly. Fortunately I do not see any signs of active infection at this time which is great news. No fevers, chills, nausea, vomiting, or diarrhea. 12/20/2021 upon evaluation today patient actually appears to be showing some signs of  excellent improvement here. I am very pleased with where things stand currently. There does not appear to be any evidence of active infection locally nor systemically which is great news and I do believe that with the hyperbaric oxygen therapy he is actually making excellent progress. Electronic Signature(s) Signed: 12/20/2021 1:32:23 PM By: Worthy Keeler PA-C Entered By: Worthy Keeler on 12/20/2021 13:32:23 [place], [place] ([id number]) -------------------------------------------------------------------------------- Physical Exam Details Patient Name: [name] [name] L. Date of Service: 12/20/2021 1:15 PM Medical Record Number: [id number] Patient Account Number: [id number] Date of Birth/Sex: [birth year] (56 y.o. M) Treating RN: [name] Primary Care Provider: [name] Other Clinician: Referring Provider: [name] Treating Provider/Extender: [name] in Treatment: 7 Constitutional Well-nourished and well-hydrated in no acute distress. Respiratory normal breathing without difficulty. Psychiatric this patient is able to make decisions and demonstrates good insight into disease process. Alert and Oriented x 3. pleasant and cooperative. Notes Patient's wound currently did require some sharp debridement to clear away some of the necrotic debris. He tolerated this today without complication and postdebridement the wound that appears to be doing much better which is great news. Electronic Signature(s) Signed: 12/20/2021 1:32:42 PM By: Worthy Keeler PA-C Entered By: Worthy Keeler on 12/20/2021 13:32:41 [name] [name] ([id number]) -------------------------------------------------------------------------------- Physician Orders Details Patient Name: [name] [name] L. Date of Service: 12/20/2021 1:15 PM Medical Record Number: [id number] Patient Account Number: [id number] Date of Birth/Sex: [birth year] (56 y.o. M) Treating RN: [name] Primary Care Provider: [name]  Other Clinician: Referring Provider: [name] Treating Provider/Extender: [name] in Treatment: 7 Verbal / Phone Orders: No Diagnosis Coding ICD-10 Coding Code Description E11.621 Type 2 diabetes mellitus with foot ulcer 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e M86.672 Other chronic osteomyelitis, left ankle and foot I10 Essential (primary) hypertension I25.10 Atherosclerotic heart disease of native coronary artery without angina pectoris I50.42 Chronic combined systolic (congestive) and diastolic (congestive) heart failure Follow-up Appointments o Return Appointment in 1 week. - provider visits at wound center o Return Appointment in: - HBO- (daily Monday -Friday for HBO Treatments) [place] for wound care. May utilize formulary equivalent dressing for wound treatment orders unless otherwise specified. Home Health Nurse may visit [facility] to address patientos wound care needs. o **Please direct any NON-WOUND related issues/requests for orders to patient's Primary Care Physician. **If current dressing causes regression in wound condition, may D/C ordered dressing product/s and apply Normal Saline Moist Dressing daily until next [place] or Other MD appointment. **Notify Wound Healing Center of regression in wound condition at [phone]. o Other Home Health Orders/Instructions: - HOLD WOUND VAC AT THIS TIME Bathing/ Shower/ Hygiene o Clean wound with Normal Saline or wound cleanser. o May shower with wound dressing protected with water repellent cover or cast protector. o No tub bath. Additional Orders / Instructions o Follow Nutritious Diet and Increase Protein Intake o Other: - Follow vascular, nephrology and PCP for wound care and other needs Dialysis appt as scheduled M-W-F Hyperbaric Oxygen Therapy o Indication and location: -  Left 1st and 2nd Ray Amputation osteomyelitis o If appropriate for treatment, begin HBOT per protocol: o 2.0 ATA  for 90 Minutes without Air Breaks o One treatment per day (delivered Monday through Friday unless otherwise specified in Special Instructions below): o Total # of Treatments: - 40 o Finger stick Blood Glucose Pre- and Post- HBOT Treatment. o Follow Hyperbaric Oxygen Glycemia Protocol Wound Treatment Wound #1 - Amputation Site - Digit Wound Laterality: Left Cleanser: Normal Saline 1 x Per Day/30 Days Discharge Instructions: Wash your hands with soap and water. Remove old dressing, discard into plastic bag and place into trash. Cleanse the wound with Normal Saline prior to applying a clean dressing using gauze sponges, not tissues or cotton balls. Do not scrub or use excessive force. Pat dry using gauze sponges, not tissue or cotton balls. Cleanser: Vashe 5.8 (oz) 1 x Per Day/30 Days Discharge Instructions: Use vashe 5.8 (oz) as directed Cleanser: Wound Cleanser 1 x Per Day/30 Days Discharge Instructions: Wash your hands with soap and water. Remove old dressing, discard into plastic bag and place into trash. Cleanse the wound with Wound Cleanser prior to applying a clean dressing using gauze sponges, not tissues or cotton balls. Do not scrub or use excessive force. Pat dry using gauze sponges, not tissue or cotton balls. [name] [name] ([id number]) Primary Dressing: Gauze 1 x Per Day/30 Days Discharge Instructions: VASHE/DAKINS-As directed: dry, moistened with saline or moistened with Dakins Solution Secondary Dressing: ABD Pad 5x9 (in/in) 1 x Per Day/30 Days Discharge Instructions: Cover with ABD pad Secured With: 92M Medipore H Soft Cloth Surgical Tape, 2x2 (in/yd) 1 x Per Day/30 Days Secured With: Kerlix Roll Sterile or Non-Sterile 6-ply 4.5x4 (yd/yd) 1 x Per Day/30 Days Discharge Instructions: Apply Kerlix as directed Wound #2 - Foot Wound Laterality: Left,  Lateral Topical: Betadine Discharge Instructions: paint wound bed, allow to dry Secondary Dressing: Kerlix 4.5 x 4.1 (in/yd) Discharge Instructions: lightly GLYCEMIA INTERVENTIONS PROTOCOL PRE-HBO GLYCEMIA INTERVENTIONS ACTION INTERVENTION Obtain pre-HBO capillary blood glucose (ensure 1 physician order is in chart). A. Notify HBO physician and await physician orders. 2 If result is 70 mg/dl or below: B. If the result meets the hospital definition of a critical result, follow hospital policy. A. Give patient an 8 ounce Glucerna Shake, an 8 ounce Ensure, or 8 ounces of a Glucerna/Ensure equivalent dietary supplement*. B. Wait 30 minutes. If result is 71 mg/dl to 130 mg/dl: C. Retest patientos capillary blood glucose (CBG). D. If result greater than or equal to 110 mg/dl, proceed with HBO. If result less than 110 mg/dl, notify HBO physician and consider holding HBO. If result is 131 mg/dl to 249 mg/dl: A. Proceed with HBO. A. Notify HBO physician and await physician orders. B. It is recommended to hold HBO and do If result is 250 mg/dl or greater: blood/urine ketone testing. C. If the result meets the hospital definition of a critical result, follow hospital policy. POST-HBO GLYCEMIA INTERVENTIONS ACTION INTERVENTION Obtain post HBO capillary blood glucose (ensure 1 physician order is in chart). A. Notify HBO physician and await physician orders. 2 If result is 70 mg/dl or below: B. If the result meets the hospital definition of a critical result, follow hospital policy. A. Give patient an 8 ounce Glucerna Shake, an 8 ounce Ensure, or 8 ounces of a Glucerna/Ensure equivalent dietary supplement*. B. Wait 15 minutes for symptoms of hypoglycemia (i.e. nervousness, anxiety, If result is 71 mg/dl to 100 mg/dl: sweating, chills, clamminess, irritability, confusion, tachycardia or dizziness). C. If patient asymptomatic, discharge patient. If patient symptomatic,  repeat capillary blood glucose (CBG) and notify HBO physician. If  result is 101 mg/dl to 249 mg/dl: A. Discharge patient. If result is 250 mg/dl or greater: A. Notify HBO physician and await physician orders. B. It is recommended to do blood/urine ketone testing. Larsh, [name] [name] ([id number]) C. If the result meets the hospital definition of a critical result, follow hospital policy. *Juice or candies are NOT equivalent products. If patient refuses the Glucerna or Ensure, please consult the hospital dietitian for an appropriate substitute. Electronic Signature(s) Signed: 12/20/2021 1:46:31 PM By: [name] RN Signed: 12/20/2021 5:14:00 PM By: Worthy Keeler PA-C Entered By: [name] on 12/20/2021 13:24:24 [name] [name] ([id number]) -------------------------------------------------------------------------------- Problem List Details Patient Name: [name] [name] L. Date of Service: 12/20/2021 1:15 PM Medical Record Number: [id number] Patient Account Number: [id number] Date of Birth/Sex: 1966/06/09 (56 y.o. M) Treating RN: [name] Primary Care Provider: [name] Other Clinician: Referring Provider: [name] Treating Provider/Extender: [name] in Treatment: 7 Active Problems ICD-10 Encounter Code Description Active Date MDM Diagnosis E11.621 Type 2 diabetes mellitus with foot ulcer 10/31/2021 No Yes L97.524 Non-pressure chronic ulcer of other part of left foot with necrosis of 10/31/2021 No Yes bone M86.672 Other chronic osteomyelitis, left ankle and foot 10/31/2021 No Yes I10 Essential (primary) hypertension 10/31/2021 No Yes I25.10 Atherosclerotic heart disease of native coronary artery without angina 10/31/2021 No Yes pectoris I50.42 Chronic combined systolic (congestive) and diastolic (congestive) heart 10/31/2021 No Yes failure Inactive Problems Resolved Problems Electronic Signature(s) Signed: 12/20/2021 1:17:33 PM By: Worthy Keeler PA-C Entered By:  Worthy Keeler on 12/20/2021 13:17:32 [place], [place] ([id number]) -------------------------------------------------------------------------------- Progress Note Details Patient Name: [name] [name] L. Date of Service: 12/20/2021 1:15 PM Medical Record Number: [id number] Patient Account Number: [id number] Date of Birth/Sex: Oct 05, 1966 (56 y.o. M) Treating RN: [name] Primary Care Provider: [name] Other Clinician: Referring Provider: [name] Treating Provider/Extender: [name] in Treatment: 7 Subjective Chief Complaint Information obtained from Patient HBO evaluation for treatment on the left foot diabetic ulcer with osteomyelitis. Patient is currently under our care as well for his foot. History of Present Illness (HPI) 10/31/2021 patient presents today for evaluation here in our clinic. He is actually being evaluated for hyperbaric oxygen therapy only. He has been seen at [facility] up to this point he is under the care of the vascular surgery specialty. Subsequently he is also under the care of the hyperbaric center there. With that being said that due to the fact that he is a dialysis patient he was actually recommended to be transferred to [place] for his treatments he actually lives in [place] which is where his dialysis is. It was a hardship for him to try to get from [place] especially on dialysis days all the way to [facility] get into the facility for his treatment and get back home he was literally spending all day long. He has had just 4 treatments there at [facility] thus far based on what I see in these have not been continued while he awaits approval here in our clinic. Subsequently I do have records for review that will be included in the HPI predetermination review and attached to this chart as well. I will not duplicate that here. 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 Nonetheless he continues to have exposed bone with marked soft tissue swelling compatible with osteomyelitis. This is  due to the irregularity of the head of the metatarsal as well. Nonetheless along with having associated cellulitis he is also good to be seen by infectious disease and is currently on antibiotics for this as well. Again that will be detailed in the pretreatment review section which I will attach to this note as well. He does currently have a wound VAC in place and again the wound was not evaluated here in the clinic as that is being completely managed by home health and [facility]. The patient does have a history of diabetes mellitus type 2, hypertension, coronary artery disease, and congestive heart failure. He also has cataracts of both eyes but no evidence of glaucoma on his most recent eye exam. 11/22/2021 we have been seeing this gentleman for hyperbaric oxygen therapy but have not actually evaluated his wound up to this point this was being managed by the wound care and vascular team I presumed at [place] at least that is what has been told to be previous. Nonetheless at this point I got a call from Atlee Abide who is a Librarian, academic at the [facility] vascular clinic with Dr. Durene Fruits and subsequently they were wanting to know if I could take over wound care for this patient. Apparently they do not have the ability for an office debridement which again I completely understand but nonetheless definitely is not an integral part of his healing along with the hyperbarics. Subsequently I discussed with the patient today that I do believe he would benefit going ahead and proceeding with evaluation here in the clinic for that reason I did go ahead and see him today to get things started. There is also been some confusion about the issues with his home health nurse and getting measurements to [facility] for the wound  VAC in order to continue his wound VAC therapy. With that being said after I see him today we will make a determination on what to do with wound VAC we can definitely send measurements to [facility] but again the bigger question is whether this is the best way to go or not. 11/28/2021 upon evaluation today patient appears to be doing decently well in regard to his wound. He has been tolerating the dressing changes with the Dakin's moistened gauze dressing which I think is doing a good job. Fortunately I do not see any signs of active infection locally nor systemically at this point. In general I think that he is actually making excellent progress which is great news. 2/17; seen in conjunction with HBO today. 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 Much improved condition of the wound surface. On the lateral foot and eschared area that he has been using Betadine. Tolerating HBO well 12/13/2021 upon evaluation today patient appears to be doing pretty well in regard to his foot ulcer. I am actually much more pleased currently that I been with the way the appearance looks today. The wound bed does show some signs of slough and biofilm on the surface of the wound this is minimal I Minna perform debridement the I will do so very carefully due to the fact that to be honest the patient had a significant amount of bleeding in the past being on the Brilinta he had a difficult time clotting when I debrided him previously quite significantly. Fortunately I do not see any signs of active infection at this time which is great news. No fevers, chills, nausea, vomiting, or diarrhea. 12/20/2021  upon evaluation today patient actually appears to be showing some signs of excellent improvement here. I am very pleased with where things stand currently. There does not appear to be any evidence of active infection locally nor systemically which is great news and I do believe that with the hyperbaric  oxygen therapy he is actually making excellent progress. Objective Constitutional Well-nourished and well-hydrated in no acute distress. Vitals Time Taken: 1:10 PM, Height: 70 in, Weight: 180.4 lbs, BMI: 25.9, Temperature: 98.8 F, Pulse: 84 bpm, Respiratory Rate: 16 breaths/min, Blood Pressure: 142/82 mmHg. [name] [name] ([id number]) Respiratory normal breathing without difficulty. Psychiatric this patient is able to make decisions and demonstrates good insight into disease process. Alert and Oriented x 3. pleasant and cooperative. General Notes: Patient's wound currently did require some sharp debridement to clear away some of the necrotic debris. He tolerated this today without complication and postdebridement the wound that appears to be doing much better which is great news. Integumentary (Hair, Skin) Wound #1 status is Open. Original cause of wound was Surgical Injury. The date acquired was: 09/09/2021. The wound has been in treatment 4 weeks. 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 The wound measures 5.2cm length x 2.2cm width x 1.7cm depth; 8.985cm^2 area and 15.274cm^3 volume. There is muscle, tendon, Fat Layer (Subcutaneous Tissue), and fascia exposed. There is no tunneling or undermining noted. There is a medium amount of serosanguineous drainage noted. There is medium (34-66%) red granulation within the wound bed. There is a medium (34-66%) amount of necrotic tissue within the wound bed including Adherent Slough and Necrosis of Muscle. Wound #2 status is Open. Original cause of wound was Blister. The date acquired was: 10/28/2021. The wound has been in treatment 3 weeks. The wound is located on the Left,Lateral Foot. The wound measures 2cm length x 2cm width x 0.1cm depth; 3.142cm^2 area and 0.314cm^3 volume. There is no tunneling or undermining noted. There is a none present amount of drainage noted. There is no granulation within the wound bed. There is a large  (67-100%) amount of necrotic tissue within the wound bed including Eschar. 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 There was a Excisional Skin/Subcutaneous Tissue Debridement with a total area of 11.44 sq cm performed by [name] [name]., PA-C. With the following instrument(s): Curette to remove Viable and Non-Viable tissue/material. Material removed includes Subcutaneous Tissue and Slough and. No specimens were taken. A time out was conducted at 13:20, prior to the start of the procedure. A Moderate amount of bleeding was controlled with Pressure. The procedure was tolerated well with a pain level of 0 throughout and a pain level of 0 following the procedure. Post Debridement Measurements: 5.2cm length x 2.2cm width x 1.7cm depth; 15.274cm^3 volume. Character of Wound/Ulcer Post Debridement is improved. Post procedure Diagnosis Wound #1: Same as Pre-Procedure Plan Follow-up Appointments: Return Appointment in 1 week. - provider visits at wound center Return Appointment in: - HBO- (daily Monday -Friday for HBO Treatments) Home Health: [facility] for wound care. May utilize formulary equivalent  dressing for wound treatment orders unless otherwise specified. Home Health Nurse may visit [facility] to address patient s wound care needs. **Please direct any NON-WOUND related issues/requests for orders to patient's Primary Care Physician. **If current dressing causes regression in wound  condition, may D/C ordered dressing product/s and apply Normal Saline Moist Dressing daily until next [place] or Other MD appointment. **Notify Wound Healing Center of regression in wound condition at [phone]. Other Home Health Orders/Instructions: - HOLD WOUND VAC AT THIS TIME Bathing/ Shower/ Hygiene: Feldner, Bakari L. ([id number]) Clean wound with Normal Saline or wound cleanser. May shower with wound dressing protected with water repellent cover or cast protector. No tub bath. Additional Orders / Instructions: Follow Nutritious Diet and Increase Protein Intake Other: - Follow vascular, nephrology and PCP for wound care and other needs Dialysis appt as scheduled M-W-F Hyperbaric Oxygen Therapy: Indication and location: - Left 1st and 2nd Ray Amputation osteomyelitis If appropriate for treatment, begin HBOT per protocol: 2.0 ATA for 90 Minutes without Air Breaks One treatment per day (delivered Monday through Friday unless otherwise specified in Special Instructions below): Total # of Treatments: - 40 Finger stick Blood Glucose Pre- and Post- HBOT Treatment. Follow Hyperbaric Oxygen Glycemia Protocol WOUND #1: - Amputation Site - Digit Wound Laterality: Left Cleanser: Normal Saline 1 x Per Day/30 Days Discharge Instructions: Wash your hands with soap and water. Remove old dressing, discard into plastic bag and place into trash. Cleanse the wound with Normal Saline prior to applying a clean dressing using gauze sponges, not tissues or cotton balls. Do not scrub or use excessive force. Pat dry using gauze sponges, not tissue or cotton balls. Cleanser: Vashe 5.8 (oz) 1 x Per Day/30 Days Discharge Instructions: Use vashe 5.8 (oz) as directed Cleanser: Wound Cleanser 1 x Per Day/30 Days Discharge Instructions: Wash your hands with soap and water. Remove old dressing, discard into plastic bag and place into trash. Cleanse the wound with Wound Cleanser prior to applying a clean  dressing using gauze sponges, not tissues or cotton balls. Do not scrub or use excessive force. Pat dry using gauze sponges, not tissue or cotton balls. Primary Dressing: Gauze 1 x Per Day/30 Days Discharge Instructions: VASHE/DAKINS-As directed: dry, moistened with saline or moistened with Dakins Solution Secondary Dressing: ABD Pad 5x9 (in/in) 1 x Per Day/30 Days Discharge Instructions: Cover with ABD pad Secured With: 70M Medipore H Soft Cloth Surgical Tape, 2x2 (in/yd) 1 x Per Day/30 Days Secured With: Kerlix Roll Sterile or Non-Sterile 6-ply 4.5x4 (yd/yd) 1 x Per Day/30 Days Discharge Instructions: Apply Kerlix as directed WOUND #2: - Foot Wound Laterality: Left, Lateral Topical: Betadine Discharge Instructions: paint wound bed, allow to dry Secondary Dressing: Kerlix 4.5 x 4.1 (in/yd) Discharge Instructions: lightly 1. I would recommend currently that we actually going to continue with the wound care measures as before. We will do an interval debridements. I have to be very careful due to the Brilinta that he is on he does tend to bleed very easily. With that being said I was able to clean this up quite nicely today without too much significant bleeding which is great news. 2. I am also can recommend at this time that we have the patient continue with the ABD pad and roll gauze to secure in place. 3. I am also can recommend that we continue with hyperbaric oxygen therapy. I do believe on the monitor extend this for probably at least 20 to maybe 40 visits as well to try  to help with a continuation of healing here. He is doing well with hyperbaric oxygen therapy the wound measurements are actually smaller and overall I think that we are seeing excellent progress here. We will see patient back for reevaluation in 1 week here in the clinic. If anything worsens or changes patient will contact our office for additional recommendations. Electronic Signature(s) Signed: 12/20/2021 1:33:33 PM By:  Worthy Keeler PA-C Entered By: Worthy Keeler on 12/20/2021 13:33:33 Goto, [place] ([id number]) -------------------------------------------------------------------------------- SuperBill Details Patient Name: Cozad, Neal L. Date of Service: 12/20/2021 Medical Record Number: [id number] Patient Account Number: [id number] Date of Birth/Sex: Aug 13, 1966 (56 y.o. M) Treating RN: [name] Primary Care Provider: [name] Other Clinician: Referring Provider: [name] Treating Provider/Extender: [name] in Treatment: 7 Diagnosis Coding ICD-10 Codes Code Description E11.621 Type 2 diabetes mellitus with foot ulcer 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e M86.672 Other chronic osteomyelitis, left ankle and foot I10 Essential (primary) hypertension I25.10 Atherosclerotic heart disease of native coronary artery without angina pectoris I50.42 Chronic combined systolic (congestive) and diastolic (congestive) heart failure Facility Procedures CPT4 Code: 53748270 Description: 78675 - DEB SUBQ TISSUE 20 SQ CM/< Modifier: Quantity: 1 CPT4 Code: Description: ICD-10 Diagnosis Description L97.524 Non-pressure chronic ulcer of other part of left foot with necrosis of b Modifier: one Quantity: Physician Procedures CPT4 Code: 4492010 Description: 11042 - WC PHYS SUBQ TISS 20 SQ CM Modifier: Quantity: 1 CPT4 Code: Description: ICD-10 Diagnosis Description L97.524 Non-pressure chronic ulcer of other part of left foot with necrosis of b Modifier: one Quantity: Electronic Signature(s) Signed: 12/20/2021 1:34:19 PM By: Worthy Keeler PA-C Entered By: Worthy Keeler on 12/20/2021 13:34:18

## 2021-12-20 NOTE — Progress Notes (Signed)
[name], [name] ([id number]) ?Visit Report for 12/20/2021 ?HBO Details ?Patient Name: [name], [name] ?Date of Service: 12/20/2021 10:30 AM ?Medical Record Number: [id number] ?Patient Account Number: [id number] ?Date of Birth/Sex: 10-20-1966 (56 y.o. 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 ?HBO Treatment Details ?Treatment Number: 30 ?Patient Type: Outpatient ?Chamber Type: Monoplace ?Chamber Serial #: [id number] ?Treatment Protocol: 2.0 ATA with 90 minutes oxygen, and no air breaks ?Treatment Details ?Compression Rate Down: 1.0 psi / minute ?De-Compression Rate Up: 1.5 psi / minute ?Compress Tx Pressure Air breaks and breathing periods Decompress Decompress ?Begins Reached (leave unused spaces blank) Begins Ends ?Chamber Pressure (ATA) 1 2 - - - - - - 2 1 ?Clock Time (24 hr) 10:31 10:48 - - - - - - 12:19 12:28 ?Treatment Length: 117 (minutes) ?Treatment Segments: 4 ?Vital Signs ?Capillary Blood Glucose Reference Range: 80 - 120 mg / dl ?HBO Diabetic Blood Glucose Intervention Range: <131 mg/dl or >249 mg/dl ?Time Vitals Blood Respiratory Capillary Blood Glucose Pulse Action ?Type: ?Pulse: Temperature: ?Taken: ?Pressure: ?Rate: ?Glucose (mg/dl): ?Meter #: Oximetry (%) Taken: ?Pre 10:18 128/78 72 16 98.7 123 1 none per PA ?Post 12:32 142/82 84 16 98.8 155 1 none per protocol ?Treatment Response ?Treatment Toleration: Well ?Treatment Completion ?Treatment Completed without Adverse Event ?Status: ?Electronic Signature(s) ?Signed: 12/20/2021 12:59:58 PM By: Enedina Finner RCP, RRT, CHT ?Signed: 12/20/2021 5:14:00 PM By: Worthy Keeler PA-C ?Entered  By: Enedina Finner on 12/20/2021 12:58:26 ?[name], [name] ([id number]) ?-------------------------------------------------------------------------------- ?HBO Safety Checklist Details ?Patient Name: [name], [name] ?Date of Service: 12/20/2021 10:30 AM ?Medical Record Number: [id number] ?Patient Account Number: [id number] ?Date of Birth/Sex: 1965/10/25 (56 y.o. M) ?Treating RN: Donnamarie Poag ?Primary Care Aylani Spurlock: [name] ?Other Clinician: Jacqulyn Bath ?Referring Rashunda Passon: [name] ?Treating Marianela Mandrell/Extender: Jeri Cos ?Weeks in Treatment: 7 ?HBO Safety Checklist Items ?Safety Checklist ?Consent Form Signed ?Patient voided / foley secured and emptied ?When did you last eato 10:00 am ?Last dose of injectable or oral agent n/a ?Ostomy pouch emptied and vented if applicable ?NA ?All implantable devices assessed, documented and approved ?NA ?Intravenous access site secured and place ?NA ?Valuables secured ?Linens and cotton and cotton/polyester blend (less than 51% polyester) ?Personal oil-based products / skin lotions / body lotions removed ?Wigs or hairpieces removed ?NA ?Smoking or tobacco materials removed ?NA ?Books / newspapers / magazines / loose paper removed ?NA ?Cologne, aftershave, perfume and deodorant removed ?Jewelry removed (may wrap wedding band) ?Make-up removed ?NA ?Hair care products removed ?Battery operated devices (external) removed ?NA ?Heating patches and chemical warmers removed ?NA ?Titanium eyewear removed ?NA ?Nail polish cured greater than 10 hours ?NA ?Casting material cured greater than 10 hours ?NA ?Hearing aids removed ?NA ?Loose dentures or partials removed ?NA ?Prosthetics have been removed ?NA ?Patient demonstrates correct use of air break device (if applicable) ?Patient concerns have been addressed ?Patient grounding bracelet on and cord attached to chamber ?Specifics for Inpatients (complete in addition to ?above) ?Medication sheet sent with patient ?Intravenous  medications needed or due during therapy sent with patient ?Drainage tubes (e.g. nasogastric tube or chest tube secured and vented) ?Endotracheal or Tracheotomy tube secured ?Cuff deflated of air and inflated with saline ?Airway suctioned ?Electronic Signature(s) ?Signed: 12/20/2021  12:59:58 PM By: Enedina Finner RCP, RRT, CHT ?Entered By: Enedina Finner on 12/20/2021 10:34:17 ?

## 2021-12-20 NOTE — Progress Notes (Signed)
[name] ([id number]) Visit Report for 12/20/2021 Arrival Information Details Patient Name: [name], [name]. Date of Service: 12/20/2021 1:15 PM Medical Record Number: [id number] Patient Account Number: [id number] Date of Birth/Sex: April 27, 1966 (56 y.o. M) Treating RN: [name] Primary Care Dorsel Flinn: [name] Other Clinician: Referring Thang Flett: [name] Treating Jewelia Bocchino/Extender: [name] in Treatment: 7 Visit Information History Since Last Visit All ordered tests and consults were completed: No Patient Arrived: Ambulatory Added or deleted any medications: No Arrival Time: 13:07 Any new allergies or adverse reactions: No Accompanied By: self Had a fall or experienced change in No Transfer Assistance: None activities of daily living that may affect Patient Identification Verified: Yes risk of falls: Secondary Verification Process Completed: Yes Signs or symptoms of abuse/neglect since last visito No Patient Requires Transmission-Based No Hospitalized since last visit: No Precautions: Implantable device outside of the clinic excluding No Patient Has Alerts: Yes cellular tissue based products placed in the center Patient Alerts: Patient on Blood since last visit: Thinner Has Dressing in Place as Prescribed: Yes DIALYSIS RIGHT ARM Pain Present Now: No Aspirin 81/Brilinta Electronic Signature(s) Signed: 12/20/2021 1:46:31 PM By: [name] RN Entered By: [name] on 12/20/2021 13:10:19 Morson, [place] ([id number]) -------------------------------------------------------------------------------- Clinic Level of Care Assessment Details Patient Name: [name], [name]. Date of Service: 12/20/2021 1:15 PM Medical Record Number: [id number] Patient Account Number: [id number] Date of Birth/Sex: 1966/02/24 (56 y.o. M) Treating RN: [name] Primary Care Neha Waight: [name] Other Clinician: Referring Malasia Torain: [name] Treating Devri Kreher/Extender:  [name] in Treatment: 7 Clinic Level of Care Assessment Items TOOL 1 Quantity Score []  - Use when EandM and Procedure is performed on INITIAL visit 0 ASSESSMENTS - Nursing Assessment / Reassessment []  - General Physical Exam (combine w/ comprehensive assessment (listed just below) when performed on new 0 pt. evals) []  - 0 Comprehensive Assessment (HX, ROS, Risk Assessments, Wounds Hx, etc.) ASSESSMENTS - Wound and Skin Assessment / Reassessment []  - Dermatologic / Skin Assessment (not related to wound area) 0 ASSESSMENTS - Ostomy and/or Continence Assessment and Care []  - Incontinence Assessment and Management 0 []  - 0 Ostomy Care Assessment and Management (repouching, etc.) PROCESS - Coordination of Care []  - Simple Patient / Family Education for ongoing care 0 []  - 0 Complex (extensive) Patient / Family Education for ongoing care []  - 0 Staff obtains Programmer, systems, Records, Test Results / Process Orders []  - 0 Staff telephones HHA, Nursing Homes / Clarify orders / etc []  - 0 Routine Transfer to another Facility (non-emergent condition) []  - 0 Routine Hospital Admission (non-emergent condition) []  - 0 New Admissions / Biomedical engineer / Ordering NPWT, Apligraf, etc. []  - 0 Emergency Hospital Admission (emergent condition) PROCESS - Special Needs []  - Pediatric / Minor Patient Management 0 []  - 0 Isolation Patient Management []  - 0 Hearing / Language / Visual special needs []  - 0 Assessment of Community assistance (transportation, D/C planning, etc.) []  - 0 Additional assistance / Altered mentation []  - 0 Support Surface(s) Assessment (bed, cushion, seat, etc.) INTERVENTIONS - Miscellaneous []  - External ear exam 0 []  - 0 Patient Transfer (multiple staff / Civil Service fast streamer / Similar devices) []  - 0 Simple Staple / Suture removal (25 or less) []  - 0 Complex Staple / Suture removal (26 or more) []  - 0 Hypo/Hyperglycemic Management (do not check if billed  separately) []  - 0 Ankle / Brachial Index (ABI) - do not check if billed separately Has the patient been seen at the  hospital within the last three years: Yes Total Score: 0 Level Of Care: ____ [name]. ([id number]) Electronic Signature(s) Signed: 12/20/2021 1:46:31 PM By: [name] RN Entered By: [name] on 12/20/2021 13:27:26 [name], [name] ([id number]) -------------------------------------------------------------------------------- Encounter Discharge Information Details Patient Name: [name]. Date of Service: 12/20/2021 1:15 PM Medical Record Number: [id number] Patient Account Number: [id number] Date of Birth/Sex: 08/16/1966 (56 y.o. M) Treating RN: [name] Primary Care Carliss Quast: [name] Other Clinician: Referring Aloysuis Ribaudo: [name] Treating Siyon Linck/Extender: [name] in Treatment: 7 Encounter Discharge Information Items Post Procedure Vitals Discharge Condition: Stable Temperature (F): 98.8 Ambulatory Status: Ambulatory Pulse (bpm): 84 Discharge Destination: Home Respiratory Rate (breaths/min): 16 Transportation: Private Auto Blood Pressure (mmHg): 142/82 Accompanied By: self Schedule Follow-up Appointment: Yes Clinical Summary of Care: Patient Declined Electronic Signature(s) Signed: 12/20/2021 1:46:31 PM By: [name] RN Entered By: [name] on 12/20/2021 13:28:45 [name], [name]. ([id number]) -------------------------------------------------------------------------------- Lower Extremity Assessment Details Patient Name: [name], [name]. Date of Service: 12/20/2021 1:15 PM Medical Record Number: [id number] Patient Account Number: [id number] Date of Birth/Sex: [birth year] (56 y.o. M) Treating RN: [name] Primary Care Toa Mia: [name] Other Clinician: Referring Kenza Munar: [name] Treating Danahi Reddish/Extender: [name] in Treatment: 7 Electronic Signature(s) Signed: 12/20/2021 1:46:31 PM By: [name] RN Entered By: [name] on 12/20/2021 13:15:31 [name] ([id number]) -------------------------------------------------------------------------------- Multi Wound Chart Details Patient Name: [name], [name]. Date of Service: 12/20/2021 1:15 PM Medical Record Number: [id number] Patient Account Number: [id number] Date of Birth/Sex: [birth year] (56 y.o. 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 Comorbid History: Cataracts, Anemia, Sleep Apnea, Cataracts, Anemia, Sleep Apnea, N/A Arrhythmia, Congestive Heart Arrhythmia, Congestive Heart Failure, Coronary Artery Disease, Failure, Coronary Artery Disease, Hypertension, Myocardial Infarction, Hypertension, Myocardial Infarction, Type II Diabetes, Neuropathy, Type II Diabetes, Neuropathy, Seizure Disorder Seizure Disorder Date Acquired: 09/09/2021 10/28/2021 N/A Weeks of Treatment: 4 3 N/A Wound Status: Open Open N/A Wound Recurrence: No No N/A Measurements [name] x W x D (cm) 5.2x2.2x1.7 2x2x0.1 N/A Area (cm) : 8.985 3.142 N/A Volume (cm) : 15.274 0.314 N/A % Reduction in Area: 36.40% 0.00% N/A % Reduction in Volume: 62.70% 0.00% N/A Classification: Full Thickness With Exposed Unable to visualize wound bed N/A Support Structures Exudate Amount: Medium None Present N/A Exudate Type: Serosanguineous N/A N/A Exudate Color: red, brown N/A N/A Granulation Amount: Medium (34-66%) None Present (0%)  N/A Granulation Quality: Red N/A N/A Necrotic Amount: Medium (34-66%) Large (67-100%) N/A Necrotic Tissue: Adherent Slough Eschar N/A Exposed Structures: Fascia: Yes Fascia: No N/A Fat Layer (Subcutaneous Tissue): Fat Layer (Subcutaneous Tissue): Yes No Tendon: Yes Tendon: No Muscle: Yes Muscle: No Joint: No Joint: No Bone: No Bone: No Epithelialization: None None N/A Treatment Notes Electronic Signature(s) Coor, Antone [name]. ([id number]) Signed: 12/20/2021 1:46:31 PM By: [name] RN Entered By: [name] on 12/20/2021 13:15:49 Limbach, [place] ([id number]) -------------------------------------------------------------------------------- [place] Details Patient Name: [name], [name] PAULDING. Date of Service: 12/20/2021 1:15 PM Medical Record Number: [id number] Patient Account Number: [id number] Date of Birth/Sex: 1966/08/28 (56 y.o. M) Treating RN: [name] Primary Care Exodus Kutzer:  [name] Other Clinician: Referring Elliette Seabolt: [name] Treating Jasia Hiltunen/Extender: [name] in Treatment: 7 Active Inactive HBO Nursing Diagnoses: Anxiety related to feelings of confinement associated with the hyperbaric oxygen chamber Anxiety related to knowledge deficit of hyperbaric oxygen therapy and treatment procedures Discomfort related to temperature and humidity changes inside hyperbaric chamber Potential for barotraumas to ears, sinuses, teeth, and lungs or cerebral gas embolism related to changes in atmospheric pressure inside hyperbaric oxygen chamber Potential for oxygen toxicity seizures related to delivery of 100% oxygen at an increased atmospheric pressure Potential for pulmonary oxygen toxicity related to delivery of 100% oxygen at an increased atmospheric pressure Goals: Barotrauma will be prevented during HBO2 Date Initiated: 10/31/2021 Target Resolution Date: 12/06/2021 Goal Status: Active Patient and/or family will be able to state/discuss  factors appropriate to the management of their disease process during treatment Date Initiated: 10/31/2021 Target Resolution Date: 11/15/2021 Goal Status: Active Patient will tolerate the hyperbaric oxygen therapy treatment Date Initiated: 10/31/2021 Target Resolution Date: 12/06/2021 Goal Status: Active Patient will tolerate the internal climate of the chamber Date Initiated: 10/31/2021 Target Resolution Date: 11/29/2021 Goal Status: Active Patient/caregiver will verbalize understanding of HBO goals, rationale, procedures and potential hazards Date Initiated: 10/31/2021 Target Resolution Date: 11/19/2021 Goal Status: Active Signs and symptoms of pulmonary oxygen toxicity will be recognized and promptly addressed Date Initiated: 10/31/2021 Target Resolution Date: 12/13/2021 Goal Status: Active Signs and symptoms of seizure will be recognized and promptly addressed ; seizing patients will suffer no harm Date Initiated: 10/31/2021 Target Resolution Date: 12/20/2021 Goal Status: Active Interventions: Administer a five (5) minute air break for patient if signs and symptoms of seizure appear and notify the hyperbaric physician Administer a ten (10) minute air break for patient if signs and symptoms of seizure appear and notify the hyperbaric physician Administer the correct therapeutic gas delivery based on the patients needs and limitations, per physician order Assess and provide for patientos comfort related to the hyperbaric environment and equalization of middle ear Assess for signs and symptoms related to adverse events, including but not limited to confinement anxiety, pneumothorax, oxygen toxicity and baurotrauma Assess patient for any history of confinement anxiety Assess patient's knowledge and expectations regarding hyperbaric medicine and provide education related to the hyperbaric environment, goals of treatment and prevention of adverse events Notes: Necrotic Tissue Nursing  Diagnoses: Impaired tissue integrity related to necrotic/devitalized tissue Knowledge deficit related to management of necrotic/devitalized tissue [name], [name]. ([id number]) Goals: Necrotic/devitalized tissue will be minimized in the wound bed Date Initiated: 11/28/2021 Target Resolution Date: 11/28/2021 Goal Status: Active Patient/caregiver will verbalize understanding of reason and process for debridement of necrotic tissue Date Initiated: 11/28/2021 Target Resolution Date: 11/28/2021 Goal Status: Active Interventions: Assess patient pain level pre-, during and post procedure and prior to discharge Provide education on necrotic tissue and debridement process Treatment Activities: Apply topical anesthetic as ordered : 11/28/2021 Excisional debridement : 11/28/2021 Notes: Wound/Skin Impairment Nursing Diagnoses: Impaired tissue integrity Knowledge deficit related to smoking impact on wound healing Knowledge deficit related to ulceration/compromised skin integrity Goals: Ulcer/skin breakdown will have a volume reduction of 30% by week 4 Date Initiated: 11/28/2021 Target Resolution Date: 12/12/2021 Goal Status: Active Interventions: Assess patient/caregiver ability to obtain necessary supplies Assess patient/caregiver ability to perform ulcer/skin care regimen upon admission and as needed Assess ulceration(s) every visit Treatment Activities: Topical wound management initiated : 11/28/2021 Notes: Electronic Signature(s) Signed: 12/20/2021 1:46:31 PM By: [name] RN Entered By: [name] on 12/20/2021 13:15:37 [name], [name]. ([id number]) --------------------------------------------------------------------------------  Pain Assessment Details Patient Name: [name], [name]. Date of Service: 12/20/2021 1:15 PM Medical Record Number: [id number] Patient Account Number: [id number] Date of Birth/Sex: [birth year] (56 y.o. M) Treating RN: [name] Primary Care Manika Hast: [name] Other  Clinician: Referring Franki Stemen: [name] Treating Manan Olmo/Extender: [name] in Treatment: 7 Active Problems Location of Pain Severity and Description of Pain Patient Has Paino No Site Locations Pain Management and Medication Current Pain Management: Electronic Signature(s) Signed: 12/20/2021 1:46:31 PM By: [name] RN Entered By: [name] on 12/20/2021 13:10:54 [name], [name] ([id number]) -------------------------------------------------------------------------------- Patient/Caregiver Education Details Patient Name: Purple Sage, Shanna [name]. Date of Service: 12/20/2021 1:15 PM Medical Record Number: [id number] Patient Account Number: [id number] Date of Birth/Gender: [birth year] (56 y.o. M) Treating RN: [name] Primary Care Physician: [name] Other Clinician: Referring Physician: [name] Treating Physician/Extender: [name] in Treatment: 7 Education Assessment Education Provided To: Patient Education Topics Provided Wound Debridement: Methods: Explain/Verbal Responses: State content correctly Electronic Signature(s) Signed: 12/20/2021 1:46:31 PM By: [name] RN Entered By: [name] on 12/20/2021 13:27:41 Steel, Jaxtyn [name]. ([id number]) -------------------------------------------------------------------------------- Wound Assessment Details Patient Name: [name], [name]. Date of Service: 12/20/2021 1:15 PM Medical Record Number: [id number] Patient Account Number: [id number] Date of Birth/Sex: 25-Oct-1965 (56 y.o. M) Treating RN: [name] Primary Care Nelton Amsden: [name] Other Clinician: Referring Jerico Grisso: [name] Treating Catha Ontko/Extender: [name] in Treatment: 7 Wound Status Wound Number: 1 Primary Open Surgical Wound Etiology: Wound Location: Left Amputation Site - Digit Wound Open Wounding Event: Surgical Injury Status: Date Acquired: 09/09/2021 Comorbid Cataracts, Anemia, Sleep Apnea, Arrhythmia,  Congestive Weeks Of Treatment: 4 History: Heart Failure, Coronary Artery Disease, Hypertension, Clustered Wound: No Myocardial Infarction, Type II Diabetes, Neuropathy, Seizure Disorder Photos Wound Measurements Length: (cm) 5.2 Width: (cm) 2.2 Depth: (cm) 1.7 Area: (cm) 8.985 Volume: (cm) 15.274 % Reduction in Area: 36.4% % Reduction in Volume: 62.7% Epithelialization: None Tunneling: No Undermining: No Wound Description Classification: Full Thickness With Exposed Support Structures Exudate Amount: Medium Exudate Type: Serosanguineous Exudate Color: red, brown Foul Odor After Cleansing: No Slough/Fibrino Yes Wound Bed Granulation Amount: Medium (34-66%) Exposed Structure Granulation Quality: Red Fascia Exposed: Yes Necrotic Amount: Medium (34-66%) Fat Layer (Subcutaneous Tissue) Exposed: Yes Necrotic Quality: Adherent Slough Tendon Exposed: Yes Muscle Exposed: Yes Necrosis of Muscle: Yes Joint Exposed: No Bone Exposed: No Treatment Notes Wound #1 (Amputation Site - Digit) Wound Laterality: Left Cleanser Normal Saline Discharge Instruction: Wash your hands with soap and water. Remove old dressing, discard into plastic bag and place into trash. Cleanse the wound with Normal Saline prior to applying a clean dressing using gauze sponges, not tissues or cotton balls. Do not [place], [place] ([id number]) scrub or use excessive force. Pat dry using gauze sponges, not tissue or cotton balls. Vashe 5.8 (oz) Discharge Instruction: Use vashe 5.8 (oz) as directed Wound Cleanser Discharge Instruction: Wash your hands with soap and water. Remove old dressing, discard into plastic bag and place into trash. Cleanse the wound with Wound Cleanser prior to applying a clean dressing using gauze sponges, not tissues or cotton balls. Do not scrub or use excessive force. Pat dry using gauze sponges, not tissue or cotton balls. Peri-Wound Care Topical Primary Dressing Gauze Discharge  Instruction: VASHE/DAKINS-As directed: dry, moistened with saline or moistened with Dakins Solution Secondary Dressing ABD Pad 5x9 (in/in) Discharge Instruction: Cover with ABD pad Secured With 46M Medipore H Soft Cloth Surgical Tape, 2x2 (in/yd) Kerlix Roll Sterile or Non-Sterile 6-ply 4.5x4 (yd/yd) Discharge Instruction:  Apply Kerlix as directed Compression Wrap Compression Stockings Add-Ons Electronic Signature(s) Signed: 12/20/2021 1:46:31 PM By: [name] RN Entered By: [name] on 12/20/2021 13:15:04 Plotz, Teancum [name]. ([id number]) -------------------------------------------------------------------------------- Wound Assessment Details Patient Name: [name], [name]. Date of Service: 12/20/2021 1:15 PM Medical Record Number: [id number] Patient Account Number: [id number] Date of Birth/Sex: 04/22/1966 (56 y.o. M) Treating RN: [name] Primary Care Tyon Cerasoli: [name] Other Clinician: Referring Boyce Keltner: [name] Treating Kue Fox/Extender: [name] in Treatment: 7 Wound Status Wound Number: 2 Primary Diabetic Wound/Ulcer of the Lower Extremity Etiology: Wound Location: Left, Lateral Foot Wound Open Wounding Event: Blister Status: Date Acquired: 10/28/2021 Comorbid Cataracts, Anemia, Sleep Apnea, Arrhythmia, Congestive Weeks Of Treatment: 3 History: Heart Failure, Coronary Artery Disease, Hypertension, Clustered Wound: No Myocardial Infarction, Type II Diabetes, Neuropathy, Seizure Disorder Photos Wound Measurements Length: (cm) 2 % Reduc Width: (cm) 2 % Reduc Depth: (cm) 0.1 Epithel Area: (cm) 3.142 Tunnel Volume: (cm) 0.314 Underm tion in Area: 0% tion in Volume: 0% ialization: None ing: No ining: No Wound Description Classification: Unable to visualize wound bed Foul Od Exudate Amount: None Present Slough/ or After Cleansing: No Fibrino Yes Wound Bed Granulation Amount: None Present (0%) Exposed Structure Necrotic Amount: Large  (67-100%) Fascia Exposed: No Necrotic Quality: Eschar Fat Layer (Subcutaneous Tissue) Exposed: No Tendon Exposed: No Muscle Exposed: No Joint Exposed: No Bone Exposed: No Treatment Notes Wound #2 (Foot) Wound Laterality: Left, Lateral Cleanser Peri-Wound Care Topical Betadine Brougham, Llewyn [name]. ([id number]) Discharge Instruction: paint wound bed, allow to dry Primary Dressing Secondary Dressing Kerlix 4.5 x 4.1 (in/yd) Discharge Instruction: lightly Secured With Compression Wrap Compression Stockings Add-Ons Electronic Signature(s) Signed: 12/20/2021 1:46:31 PM By: [name] RN Entered By: [name] on 12/20/2021 13:15:19 Hord, [place] ([id number]) -------------------------------------------------------------------------------- Vitals Details Patient Name: Mamone, [name]. Date of Service: 12/20/2021 1:15 PM Medical Record Number: [id number] Patient Account Number: [id number] Date of Birth/Sex: 25-Apr-1966 (56 y.o. M) Treating RN: [name] Primary Care Nalani Andreen: [name] Other Clinician: Referring Zyheir Daft: [name] Treating Jeral Zick/Extender: [name] in Treatment: 7 Vital Signs Time Taken: 13:10 Temperature (F): 98.8 Height (in): 70 Pulse (bpm): 84 Weight (lbs): 180.4 Respiratory Rate (breaths/min): 16 Body Mass Index (BMI): 25.9 Blood Pressure (mmHg): 142/82 Reference Range: 80 - 120 mg / dl Electronic Signature(s) Signed: 12/20/2021 1:46:31 PM By: [name] RN Entered By: [name] on 12/20/2021 13:10:47

## 2021-12-23 ENCOUNTER — Encounter: Payer: Medicare Other | Admitting: Physician Assistant

## 2021-12-23 ENCOUNTER — Other Ambulatory Visit: Payer: Self-pay

## 2021-12-23 DIAGNOSIS — I2510 Atherosclerotic heart disease of native coronary artery without angina pectoris: Secondary | ICD-10-CM | POA: Diagnosis not present

## 2021-12-23 DIAGNOSIS — Z992 Dependence on renal dialysis: Secondary | ICD-10-CM | POA: Diagnosis not present

## 2021-12-23 DIAGNOSIS — D631 Anemia in chronic kidney disease: Secondary | ICD-10-CM | POA: Diagnosis not present

## 2021-12-23 DIAGNOSIS — M86672 Other chronic osteomyelitis, left ankle and foot: Secondary | ICD-10-CM | POA: Diagnosis not present

## 2021-12-23 DIAGNOSIS — N186 End stage renal disease: Secondary | ICD-10-CM | POA: Diagnosis not present

## 2021-12-23 DIAGNOSIS — I110 Hypertensive heart disease with heart failure: Secondary | ICD-10-CM | POA: Diagnosis not present

## 2021-12-23 DIAGNOSIS — I5042 Chronic combined systolic (congestive) and diastolic (congestive) heart failure: Secondary | ICD-10-CM | POA: Diagnosis not present

## 2021-12-23 DIAGNOSIS — L97524 Non-pressure chronic ulcer of other part of left foot with necrosis of bone: Secondary | ICD-10-CM | POA: Diagnosis not present

## 2021-12-23 DIAGNOSIS — E11621 Type 2 diabetes mellitus with foot ulcer: Secondary | ICD-10-CM | POA: Diagnosis not present

## 2021-12-23 DIAGNOSIS — N2581 Secondary hyperparathyroidism of renal origin: Secondary | ICD-10-CM | POA: Diagnosis not present

## 2021-12-23 LAB — GLUCOSE, CAPILLARY
Glucose-Capillary: 174 mg/dL — ABNORMAL HIGH (ref 70–99)
Glucose-Capillary: 119 mg/dL — ABNORMAL HIGH (ref 70–99)

## 2021-12-23 NOTE — Progress Notes (Signed)
[place], [place] ([id number]) ?Visit Report for 12/23/2021 ?HBO Details ?Patient Name: [name], [name] ?Date of Service: 12/23/2021 10:30 AM ?Medical Record Number: [id number] ?Patient Account Number: [id number] ?Date of Birth/Sex: 04/06/1966 (56 y.o. 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 ?HBO Treatment Details ?Treatment Number: 73 ?Patient Type: Outpatient ?Chamber Type: Monoplace ?Chamber Serial #: [id number] ?Treatment Protocol: 2.0 ATA with 90 minutes oxygen, and no air breaks ?Treatment Details ?Compression Rate Down: 1.0 psi / minute ?De-Compression Rate Up: 1.5 psi / minute ?Compress Tx Pressure Air breaks and breathing periods Decompress Decompress ?Begins Reached (leave unused spaces blank) Begins Ends ?Chamber Pressure (ATA) 1 2 - - - - - - 2 1 ?Clock Time (24 hr) 10:34 10:51 - - - - - - 12:21 12:30 ?Treatment Length: 116 (minutes) ?Treatment Segments: 4 ?Vital Signs ?Capillary Blood Glucose Reference Range: 80 - 120 mg / dl ?HBO Diabetic Blood Glucose Intervention Range: <131 mg/dl or >249 mg/dl ?Time Vitals Blood Respiratory Capillary Blood Glucose Pulse Action ?Type: ?Pulse: Temperature: ?Taken: ?Pressure: ?Rate: ?Glucose (mg/dl): ?Meter #: Oximetry (%) Taken: ?Pre 10:20 136/70 66 16 98.7 174 1 none per protocol ?Post 12:34 136/78 72 16 98.3 119 1 none per protocol ?Treatment Response ?Treatment Toleration: Well ?Treatment Completion ?Treatment Completed without Adverse Event ?Status: ?Electronic Signature(s) ?Signed: 12/23/2021 2:14:45 PM By: Enedina Finner RCP, RRT, CHT ?Signed: 12/23/2021 4:03:57 PM By: Worthy Keeler  PA-C ?Entered By: Enedina Finner on 12/23/2021 13:04:27 ?[place], [place] ([id number]) ?-------------------------------------------------------------------------------- ?HBO Safety Checklist Details ?Patient Name: [name], [name] ?Date of Service: 12/23/2021 10:30 AM ?Medical Record Number: [id number] ?Patient Account Number: [id number] ?Date of Birth/Sex: [birth year] (56 y.o. M) ?Treating RN: Donnamarie Poag ?Primary Care Starkeisha Vanwinkle: [name] ?Other Clinician: Jacqulyn Bath ?Referring Keane Martelli: [name] ?Treating Nalany Steedley/Extender: Jeri Cos ?Weeks in Treatment: 7 ?HBO Safety Checklist Items ?Safety Checklist ?Consent Form Signed ?Patient voided / foley secured and emptied ?When did you last eato 10:00 ?Last dose of injectable or oral agent n/a ?Ostomy pouch emptied and vented if applicable ?NA ?All implantable devices assessed, documented and approved ?NA ?Intravenous access site secured and place ?NA ?Valuables secured ?Linens and cotton and cotton/polyester blend (less than 51% polyester) ?Personal oil-based products / skin lotions / body lotions removed ?Wigs or hairpieces removed ?NA ?Smoking or tobacco materials removed ?NA ?Books / newspapers / magazines / loose paper removed ?NA ?Cologne, aftershave, perfume and deodorant removed ?Jewelry removed (may wrap wedding band) ?Make-up removed ?NA ?Hair care products removed ?Battery operated devices (external) removed ?NA ?Heating patches and chemical warmers removed ?NA ?Titanium eyewear removed ?NA ?Nail polish cured greater than 10 hours ?NA ?Casting material cured greater than 10 hours ?NA ?Hearing aids removed ?NA ?Loose dentures or partials removed ?NA ?Prosthetics have been removed ?NA ?Patient demonstrates correct use of air break device (if applicable) ?Patient concerns have been addressed ?Patient grounding bracelet on and cord attached to chamber ?Specifics for Inpatients (complete in addition to ?above) ?Medication sheet sent with  patient ?Intravenous medications needed or due during therapy sent with patient ?Drainage tubes (e.g. nasogastric tube or chest tube secured and vented) ?Endotracheal or Tracheotomy tube secured ?Cuff deflated of air and inflated with saline ?Airway suctioned ?Electronic Signature(s) ?Signed: 12/23/2021 2:14:45  PM By: Enedina Finner RCP, RRT, CHT ?Entered By: Enedina Finner on 12/23/2021 10:40:29 ?

## 2021-12-23 NOTE — Progress Notes (Signed)
[place], [place] ([id number]) ?Visit Report for 12/23/2021 ?Arrival Information Details ?Patient Name: [name], [name] ?Date of Service: 12/23/2021 10:30 AM ?Medical Record Number: [id number] ?Patient Account Number: [id number] ?Date of Birth/Sex: December 25, 1965 (56 y.o. M) ?Treating RN: Donnamarie Poag ?Primary Care Zuhayr Deeney: [name] ?Other Clinician: Jacqulyn Bath ?Referring Yazir Koerber: [name] ?Treating Anwen Cannedy/Extender: Jeri Cos ?Weeks in Treatment: 7 ?Visit Information History Since Last Visit ?Added or deleted any medications: No ?Patient Arrived: Ambulatory ?Any new allergies or adverse reactions: No ?Arrival Time: 10:15 ?Had a fall or experienced change in No ?Accompanied By: self ?activities of daily living that may affect ?Transfer Assistance: None ?risk of falls: ?Patient Identification Verified: Yes ?Signs or symptoms of abuse/neglect since last visito No ?Secondary Verification Process Completed: Yes ?Hospitalized since last visit: No ?Patient Requires Transmission-Based No ?Implantable device outside of the clinic excluding No ?Precautions: ?cellular tissue based products placed in the center ?Patient Has Alerts: Yes ?since last visit: ?Patient Alerts: Patient on Blood ?Has Dressing in Place as Prescribed: Yes ?Thinner ?Has Footwear/Offloading in Place as Prescribed: Yes ?DIALYSIS RIGHT ARM ?Left: Surgical Shoe with Pressure ?Aspirin 81/Brilinta ?Relief Insole ?Pain Present Now: No ?Electronic Signature(s) ?Signed: 12/23/2021 2:14:45 PM By: Enedina Finner RCP, RRT, CHT ?Entered By: Enedina Finner on 12/23/2021 10:38:35 ?[name], [name] ([id number]) ?-------------------------------------------------------------------------------- ?Encounter Discharge Information Details ?Patient Name: [name], [name] ?Date of Service: 12/23/2021 10:30 AM ?Medical Record Number: [id number] ?Patient Account Number: [id number] ?Date of Birth/Sex: 30-Nov-1965 (56 y.o. M) ?Treating RN: Donnamarie Poag ?Primary Care  Barbette Mcglaun: [name] ?Other Clinician: Jacqulyn Bath ?Referring Najae Filsaime: [name] ?Treating Arnella Pralle/Extender: Jeri Cos ?Weeks in Treatment: 7 ?Encounter Discharge Information Items ?Discharge Condition: Stable ?Ambulatory Status: Ambulatory ?Discharge Destination: Home ?Transportation: Private Auto ?Accompanied By: self ?Schedule Follow-up Appointment: Yes ?Clinical Summary of Care: ?Notes ?Patient has an HBO treatment scheduled on 12/24/21 at 8:30 am. ?Electronic Signature(s) ?Signed: 12/23/2021 2:14:45 PM By: Enedina Finner RCP, RRT, CHT ?Entered By: Enedina Finner on 12/23/2021 13:06:41 ?[name], [name] ([id number]) ?-------------------------------------------------------------------------------- ?Vitals Details ?Patient Name: [name], [name] ?Date of Service: 12/23/2021 10:30 AM ?Medical Record Number: [id number] ?Patient Account Number: [id number] ?Date of Birth/Sex: 1966/02/26 (56 y.o. M) ?Treating RN: Donnamarie Poag ?Primary Care Weslie Rasmus: [name] ?Other Clinician: Jacqulyn Bath ?Referring Wood Novacek: [name] ?Treating Aletheia Tangredi/Extender: Jeri Cos ?Weeks in Treatment: 7 ?Vital Signs ?Time Taken: 10:20 ?Temperature (??F): 98.7 ?Height (in): 70 ?Pulse (bpm): 66 ?Weight (lbs): 180.4 ?Respiratory Rate (breaths/min): 16 ?Body Mass Index (BMI): 25.9 ?Blood Pressure (mmHg): 136/70 ?Capillary Blood Glucose (mg/dl): 174 ?Reference Range: 80 - 120 mg / dl ?Electronic Signature(s) ?Signed: 12/23/2021 2:14:45 PM By: Enedina Finner RCP, RRT, CHT ?Entered By: Enedina Finner on 12/23/2021 10:39:19 ?

## 2021-12-24 ENCOUNTER — Encounter: Payer: Medicare Other | Admitting: Physician Assistant

## 2021-12-24 DIAGNOSIS — I5042 Chronic combined systolic (congestive) and diastolic (congestive) heart failure: Secondary | ICD-10-CM | POA: Diagnosis not present

## 2021-12-24 DIAGNOSIS — M86672 Other chronic osteomyelitis, left ankle and foot: Secondary | ICD-10-CM | POA: Diagnosis not present

## 2021-12-24 DIAGNOSIS — I2510 Atherosclerotic heart disease of native coronary artery without angina pectoris: Secondary | ICD-10-CM | POA: Diagnosis not present

## 2021-12-24 DIAGNOSIS — L97524 Non-pressure chronic ulcer of other part of left foot with necrosis of bone: Secondary | ICD-10-CM | POA: Diagnosis not present

## 2021-12-24 DIAGNOSIS — I110 Hypertensive heart disease with heart failure: Secondary | ICD-10-CM | POA: Diagnosis not present

## 2021-12-24 DIAGNOSIS — E11621 Type 2 diabetes mellitus with foot ulcer: Secondary | ICD-10-CM | POA: Diagnosis not present

## 2021-12-24 LAB — GLUCOSE, CAPILLARY
Glucose-Capillary: 217 mg/dL — ABNORMAL HIGH (ref 70–99)
Glucose-Capillary: 157 mg/dL — ABNORMAL HIGH (ref 70–99)

## 2021-12-24 NOTE — Progress Notes (Signed)
[place], [place] ([id number]) ?Visit Report for 12/24/2021 ?Arrival Information Details ?Patient Name: [name], [name] ?Date of Service: 12/24/2021 8:30 AM ?Medical Record Number: [id number] ?Patient Account Number: [id number] ?Date of Birth/Sex: 1966-04-04 (56 y.o. M) ?Treating RN: Donnamarie Poag ?Primary Care Elvis Laufer: [name] ?Other Clinician: Jacqulyn Bath ?Referring Amauri Keefe: [name] ?Treating Jaima Janney/Extender: Jeri Cos ?Weeks in Treatment: 7 ?Visit Information History Since Last Visit ?Added or deleted any medications: No ?Patient Arrived: Ambulatory ?Any new allergies or adverse reactions: No ?Arrival Time: 08:15 ?Had a fall or experienced change in No ?Accompanied By: self ?activities of daily living that [name] affect ?Transfer Assistance: None ?risk of falls: ?Patient Identification Verified: Yes ?Signs or symptoms of abuse/neglect since last visito No ?Secondary Verification Process Completed: Yes ?Hospitalized since last visit: No ?Patient Requires Transmission-Based No ?Implantable device outside of the clinic excluding No ?Precautions: ?cellular tissue based products placed in the center ?Patient Has Alerts: Yes ?since last visit: ?Patient Alerts: Patient on Blood ?Pain Present Now: No ?Thinner ?DIALYSIS RIGHT ARM ?Aspirin 81/Brilinta ?Electronic Signature(s) ?Signed: 12/24/2021 11:32:42 AM By: Enedina Finner RCP, RRT, CHT ?Entered By: Enedina Finner on 12/24/2021 09:40:12 ?[name], [name] ([id number]) ?-------------------------------------------------------------------------------- ?Encounter Discharge Information Details ?Patient Name: [name], [name] ?Date of Service: 12/24/2021 8:30 AM ?Medical Record Number: [id number] ?Patient Account Number: [id number] ?Date of Birth/Sex: [birth year] (56 y.o. M) ?Treating RN: Donnamarie Poag ?Primary Care Adhvik Canady: [name] ?Other Clinician: Jacqulyn Bath ?Referring Adiyah Lame: [name] ?Treating Raileigh Sabater/Extender: Jeri Cos ?Weeks in  Treatment: 7 ?Encounter Discharge Information Items ?Discharge Condition: Stable ?Ambulatory Status: Ambulatory ?Discharge Destination: Home ?Transportation: Private Auto ?Accompanied By: self ?Schedule Follow-up Appointment: Yes ?Clinical Summary of Care: ?Notes ?Patient has an HBO treatment scheduled on 12/25/21 at 10:30 am. ?Electronic Signature(s) ?Signed: 12/24/2021 11:32:42 AM By: Enedina Finner RCP, RRT, CHT ?Entered By: Enedina Finner on 12/24/2021 10:52:03 ?[name], [name] ([id number]) ?-------------------------------------------------------------------------------- ?Vitals Details ?Patient Name: [name], [name] ?Date of Service: 12/24/2021 8:30 AM ?Medical Record Number: [id number] ?Patient Account Number: [id number] ?Date of Birth/Sex: [birth year] (56 y.o. M) ?Treating RN: Donnamarie Poag ?Primary Care Hayes Czaja: [name] ?Other Clinician: Jacqulyn Bath ?Referring Kwamaine Cuppett: [name] ?Treating Eban Weick/Extender: Jeri Cos ?Weeks in Treatment: 7 ?Vital Signs ?Time Taken: 08:15 ?Temperature (??F): 98.5 ?Height (in): 70 ?Pulse (bpm): 78 ?Weight (lbs): 180.4 ?Respiratory Rate (breaths/min): 16 ?Body Mass Index (BMI): 25.9 ?Blood Pressure (mmHg): 132/76 ?Capillary Blood Glucose (mg/dl): 217 ?Reference Range: 80 - 120 mg / dl ?Electronic Signature(s) ?Signed: 12/24/2021 11:32:42 AM By: Enedina Finner RCP, RRT, CHT ?Entered By: Enedina Finner on 12/24/2021 09:40:50 ?

## 2021-12-25 ENCOUNTER — Other Ambulatory Visit: Payer: Self-pay

## 2021-12-25 ENCOUNTER — Encounter (HOSPITAL_BASED_OUTPATIENT_CLINIC_OR_DEPARTMENT_OTHER): Payer: Medicare Other | Admitting: Internal Medicine

## 2021-12-25 DIAGNOSIS — I5042 Chronic combined systolic (congestive) and diastolic (congestive) heart failure: Secondary | ICD-10-CM | POA: Diagnosis not present

## 2021-12-25 DIAGNOSIS — M86672 Other chronic osteomyelitis, left ankle and foot: Secondary | ICD-10-CM | POA: Diagnosis not present

## 2021-12-25 DIAGNOSIS — I110 Hypertensive heart disease with heart failure: Secondary | ICD-10-CM | POA: Diagnosis not present

## 2021-12-25 DIAGNOSIS — N2581 Secondary hyperparathyroidism of renal origin: Secondary | ICD-10-CM | POA: Diagnosis not present

## 2021-12-25 DIAGNOSIS — L97524 Non-pressure chronic ulcer of other part of left foot with necrosis of bone: Secondary | ICD-10-CM | POA: Diagnosis not present

## 2021-12-25 DIAGNOSIS — E11621 Type 2 diabetes mellitus with foot ulcer: Secondary | ICD-10-CM

## 2021-12-25 DIAGNOSIS — N186 End stage renal disease: Secondary | ICD-10-CM | POA: Diagnosis not present

## 2021-12-25 DIAGNOSIS — Z992 Dependence on renal dialysis: Secondary | ICD-10-CM | POA: Diagnosis not present

## 2021-12-25 DIAGNOSIS — I2510 Atherosclerotic heart disease of native coronary artery without angina pectoris: Secondary | ICD-10-CM | POA: Diagnosis not present

## 2021-12-25 DIAGNOSIS — D631 Anemia in chronic kidney disease: Secondary | ICD-10-CM | POA: Diagnosis not present

## 2021-12-25 LAB — GLUCOSE, CAPILLARY
Glucose-Capillary: 109 mg/dL — ABNORMAL HIGH (ref 70–99)
Glucose-Capillary: 153 mg/dL — ABNORMAL HIGH (ref 70–99)

## 2021-12-25 NOTE — Progress Notes (Signed)
[place], [place] ([id number]) ?Visit Report for 12/25/2021 ?HBO Details ?Patient Name: [name], [name] ?Date of Service: 12/25/2021 10:30 AM ?Medical Record Number: [id number] ?Patient Account Number: [id number] ?Date of Birth/Sex: September 08, 1966 (56 y.o. M) ?Treating RN: Donnamarie Poag ?Primary Care Jozlynn Plaia: [name] ?Other Clinician: Jacqulyn Bath ?Referring Avonna Iribe: [name] ?Treating Latanja Lehenbauer/Extender: Kalman Shan ?Weeks in Treatment: 7 ?HBO Treatment Course Details ?Treatment Course Number: 1 ?Ordering Tennis Mckinnon: Jeri Cos ?Total Treatments Ordered: 40 ?HBO Treatment Start Date: 11/04/2021 ?HBO Indication: ?Chronic Refractory Osteomyelitis to Left Foot ?HBO Treatment Details ?Treatment Number: 83 ?Patient Type: Outpatient ?Chamber Type: Monoplace ?Chamber Serial #: [id number] ?Treatment Protocol: 2.0 ATA with 90 minutes oxygen, and no air breaks ?Treatment Details ?Compression Rate Down: 1.0 psi / minute ?De-Compression Rate Up: 1.5 psi / minute ?Compress Tx Pressure Air breaks and breathing periods Decompress Decompress ?Begins Reached (leave unused spaces blank) Begins Ends ?Chamber Pressure (ATA) 1 2 - - - - - - 2 1 ?Clock Time (24 hr) 10:44 11:01 - - - - - - 12:31 12:41 ?Treatment Length: 117 (minutes) ?Treatment Segments: 4 ?Vital Signs ?Capillary Blood Glucose Reference Range: 80 - 120 mg / dl ?HBO Diabetic Blood Glucose Intervention Range: <131 mg/dl or >249 mg/dl ?Time Vitals Blood Respiratory Capillary Blood Glucose Pulse Action ?Type: ?Pulse: Temperature: ?Taken: ?Pressure: ?Rate: ?Glucose (mg/dl): ?Meter #: Oximetry (%) Taken: ?Pre 10:23 138/74 72 16 98 109 1 none per protocol ?Post 12:44 132/78 66 16 97.9 153 1 none per protocol ?Treatment Response ?Treatment Toleration: Well ?Treatment Completion ?Treatment Completed without Adverse Event ?Status: ?HBO Attestation ?I certify that I supervised this HBO treatment in accordance with ?Medicare guidelines. A trained emergency response team is readily  Yes ?available per hospital policies and procedures. ?Continue HBOT as ordered. Yes ?Electronic Signature(s) ?Signed: 12/25/2021 2:49:42 PM By: Kalman Shan DO ?Previous Signature: 12/25/2021 2:07:54 PM Version By: Enedina Finner RCP, RRT, CHT ?Entered By: Kalman Shan on 12/25/2021 14:49:18 ?[place], [place] ([id number]) ?-------------------------------------------------------------------------------- ?HBO Safety Checklist Details ?Patient Name: [name], [name] ?Date of Service: 12/25/2021 10:30 AM ?Medical Record Number: [id number] ?Patient Account Number: [id number] ?Date of Birth/Sex: 05/25/1966 (56 y.o. M) ?Treating RN: Donnamarie Poag ?Primary Care Zoey Bidwell: [name] ?Other Clinician: Jacqulyn Bath ?Referring Darcee Dekker: [name] ?Treating Deeandra Jerry/Extender: Kalman Shan ?Weeks in Treatment: 7 ?HBO Safety Checklist Items ?Safety Checklist ?Consent Form Signed ?Patient voided / foley secured and emptied ?When did you last eato 10:00 am ?Last dose of injectable or oral agent n/a ?Ostomy pouch emptied and vented if applicable ?NA ?All implantable devices assessed, documented and approved ?NA ?Intravenous access site secured and place ?NA ?Valuables secured ?Linens and cotton and cotton/polyester blend (less than 51% polyester) ?Personal oil-based products / skin lotions / body lotions removed ?Wigs or hairpieces removed ?NA ?Smoking or tobacco materials removed ?NA ?Books / newspapers / magazines / loose paper removed ?NA ?Cologne, aftershave, perfume and deodorant removed ?Jewelry removed (may wrap wedding band) ?Make-up removed ?NA ?Hair care products removed ?Battery operated devices (external) removed ?NA ?Heating patches and chemical warmers removed ?NA ?Titanium eyewear removed ?NA ?Nail polish cured greater than 10 hours ?NA ?Casting material cured greater than 10 hours ?NA ?Hearing aids removed ?NA ?Loose dentures or partials removed ?NA ?Prosthetics have been removed ?NA ?Patient  demonstrates correct use of air break device (if applicable) ?Patient concerns have been addressed ?Patient grounding bracelet on and cord attached to chamber ?Specifics for Inpatients (complete in addition to ?above) ?Medication sheet sent with patient ?Intravenous medications needed or due during  therapy sent with patient ?Drainage tubes (e.g. nasogastric tube or chest tube secured and vented) ?Endotracheal or Tracheotomy tube secured ?Cuff deflated of air and inflated with saline ?Airway suctioned ?Electronic Signature(s) ?Signed: 12/25/2021 2:07:54 PM By: Enedina Finner RCP, RRT, CHT ?Entered By: Enedina Finner on 12/25/2021 10:50:50 ?

## 2021-12-25 NOTE — Progress Notes (Signed)
[place], [place] ([id number]) ?Visit Report for 12/24/2021 ?HBO Details ?Patient Name: [name], [name] ?Date of Service: 12/24/2021 8:30 AM ?Medical Record Number: [id number] ?Patient Account Number: [id number] ?Date of Birth/Sex: December 09, 1965 (56 y.o. M) ?Treating RN: Donnamarie Poag ?Primary Care Lucielle Vokes: [name] ?Other Clinician: Jacqulyn Bath ?Referring Sparrow Sirac[place]: [name] ?Treating Britlyn Martine/Extender: Jeri Cos ?Weeks in Treatment: 7 ?HBO Treatment Course Details ?Treatment Course Number: 1 ?Ordering Sherryn Pollino: Jeri Cos ?Total Treatments Ordered: 40 ?HBO Treatment Start Date: 11/04/2021 ?HBO Indication: ?Chronic Refractory Osteomyelitis to Left Foot ?HBO Treatment Details ?Treatment Number: 32 ?Patient Type: Outpatient ?Chamber Type: Monoplace ?Chamber Serial #: [id number] ?Treatment Protocol: 2.0 ATA with 90 minutes oxygen, and no air breaks ?Treatment Details ?Compression Rate Down: 1.0 psi / minute ?De-Compression Rate Up: 1.5 psi / minute ?Compress Tx Pressure Air breaks and breathing periods Decompress Decompress ?Begins Reached (leave unused spaces blank) Begins Ends ?Chamber Pressure (ATA) 1 2 - - - - - - 2 1 ?Clock Time (24 hr) 08:34 08:51 - - - - - - 10:22 10:32 ?Treatment Length: 118 (minutes) ?Treatment Segments: 4 ?Vital Signs ?Capillary Blood Glucose Reference Range: 80 - 120 mg / dl ?HBO Diabetic Blood Glucose Intervention Range: <131 mg/dl or >249 mg/dl ?Time Vitals Blood Respiratory Capillary Blood Glucose Pulse Action ?Type: ?Pulse: Temperature: ?Taken: ?Pressure: ?Rate: ?Glucose (mg/dl): ?Meter #: Oximetry (%) Taken: ?Pre 08:15 132/76 78 16 98.5 217 1 none per protocol ?Post 10:37 140/76 72 16 98.5 157 1 none per protocol ?Treatment Response ?Treatment Toleration: Well ?Treatment Completion ?Treatment Completed without Adverse Event ?Status: ?Electronic Signature(s) ?Signed: 12/24/2021 11:32:42 AM By: Enedina Finner RCP, RRT, CHT ?Signed: 12/24/2021 7:37:05 PM By: Worthy Keeler  PA-C ?Entered By: Enedina Finner on 12/24/2021 10:50:52 ?[place], [place] ([id number]) ?-------------------------------------------------------------------------------- ?HBO Safety Checklist Details ?Patient Name: [name], [name] ?Date of Service: 12/24/2021 8:30 AM ?Medical Record Number: [id number] ?Patient Account Number: [id number] ?Date of Birth/Sex: 05/09/1966 (56 y.o. M) ?Treating RN: Donnamarie Poag ?Primary Care Gavyn Ybarra: [name] ?Other Clinician: Jacqulyn Bath ?Referring [place]: [name] ?Treating Teresa Nicodemus/Extender: Jeri Cos ?Weeks in Treatment: 7 ?HBO Safety Checklist Items ?Safety Checklist ?Consent Form Signed ?Patient voided / foley secured and emptied ?When did you last eato 07:00 am ?Last dose of injectable or oral agent n/a ?Ostomy pouch emptied and vented if applicable ?NA ?All implantable devices assessed, documented and approved ?NA ?Intravenous access site secured and place ?NA ?Valuables secured ?Linens and cotton and cotton/polyester blend (less than 51% polyester) ?Personal oil-based products / skin lotions / body lotions removed ?Wigs or hairpieces removed ?NA ?Smoking or tobacco materials removed ?NA ?Books / newspapers / magazines / loose paper removed ?NA ?Cologne, aftershave, perfume and deodorant removed ?Jewelry removed (may wrap wedding band) ?Make-up removed ?NA ?Hair care products removed ?Battery operated devices (external) removed ?NA ?Heating patches and chemical warmers removed ?NA ?Titanium eyewear removed ?NA ?Nail polish cured greater than 10 hours ?NA ?Casting material cured greater than 10 hours ?NA ?Hearing aids removed ?NA ?Loose dentures or partials removed ?NA ?Prosthetics have been removed ?NA ?Patient demonstrates correct use of air break device (if applicable) ?Patient concerns have been addressed ?Patient grounding bracelet on and cord attached to chamber ?Specifics for Inpatients (complete in addition to ?above) ?Medication sheet sent with  patient ?Intravenous medications needed or due during therapy sent with patient ?Drainage tubes (e.g. nasogastric tube or chest tube secured and vented) ?Endotracheal or Tracheotomy tube secured ?Cuff deflated of air and inflated with saline ?Airway suctioned ?Electronic Signature(s) ?Signed: 12/24/2021  11:32:42 AM By: Enedina Finner RCP, RRT, CHT ?Entered By: Enedina Finner on 12/24/2021 09:42:16 ?

## 2021-12-25 NOTE — Progress Notes (Signed)
[place], [place] ([id number]) ?Visit Report for 12/25/2021 ?Arrival Information Details ?Patient Name: [name], [name] ?Date of Service: 12/25/2021 10:30 AM ?Medical Record Number: [id number] ?Patient Account Number: [id number] ?Date of Birth/Sex: 1966/08/19 (56 y.o. M) ?Treating RN: Donnamarie Poag ?Primary Care Sigourney Portillo: [name] ?Other Clinician: Jacqulyn Bath ?Referring Jameal Razzano: [name] ?Treating Eero Dini/Extender: Kalman Shan ?Weeks in Treatment: 7 ?Visit Information History Since Last Visit ?Added or deleted any medications: No ?Patient Arrived: Ambulatory ?Any new allergies or adverse reactions: No ?Arrival Time: 10:22 ?Had a fall or experienced change in No ?Accompanied By: self ?activities of daily living that may affect ?Transfer Assistance: None ?risk of falls: ?Patient Identification Verified: Yes ?Signs or symptoms of abuse/neglect since last visito No ?Secondary Verification Process Completed: Yes ?Hospitalized since last visit: No ?Patient Requires Transmission-Based No ?Implantable device outside of the clinic excluding No ?Precautions: ?cellular tissue based products placed in the center ?Patient Has Alerts: Yes ?since last visit: ?Patient Alerts: Patient on Blood ?Has Dressing in Place as Prescribed: Yes ?Thinner ?Has Footwear/Offloading in Place as Prescribed: Yes ?DIALYSIS RIGHT ARM ?Left: Surgical Shoe with Pressure ?Aspirin 81/Brilinta ?Relief Insole ?Pain Present Now: No ?Electronic Signature(s) ?Signed: 12/25/2021 2:07:54 PM By: Enedina Finner RCP, RRT, CHT ?Entered By: Enedina Finner on 12/25/2021 10:48:44 ?[place], [place] ([id number]) ?-------------------------------------------------------------------------------- ?Encounter Discharge Information Details ?Patient Name: [name], [name] ?Date of Service: 12/25/2021 10:30 AM ?Medical Record Number: [id number] ?Patient Account Number: [id number] ?Date of Birth/Sex: [birth year] (56 y.o. M) ?Treating RN: Donnamarie Poag ?Primary  Care Edger Husain: [name] ?Other Clinician: Jacqulyn Bath ?Referring Sulma Ruffino: [name] ?Treating Lajuan Godbee/Extender: Kalman Shan ?Weeks in Treatment: 7 ?Encounter Discharge Information Items ?Discharge Condition: Stable ?Ambulatory Status: Ambulatory ?Discharge Destination: Home ?Transportation: Private Auto ?Accompanied By: self ?Schedule Follow-up Appointment: Yes ?Clinical Summary of Care: ?Notes ?Patient has an HBO treatment scheduled on 12/26/21 at 8:00 am. ?Electronic Signature(s) ?Signed: 12/25/2021 2:07:54 PM By: Enedina Finner RCP, RRT, CHT ?Entered By: Enedina Finner on 12/25/2021 13:23:44 ?[place], [place] ([id number]) ?-------------------------------------------------------------------------------- ?Vitals Details ?Patient Name: [name], [name] ?Date of Service: 12/25/2021 10:30 AM ?Medical Record Number: [id number] ?Patient Account Number: [id number] ?Date of Birth/Sex: Feb 25, 1966 (56 y.o. M) ?Treating RN: Donnamarie Poag ?Primary Care Renato Spellman: [name] ?Other Clinician: Jacqulyn Bath ?Referring Kingsley Herandez: [name] ?Treating Aerianna Losey/Extender: Kalman Shan ?Weeks in Treatment: 7 ?Vital Signs ?Time Taken: 10:23 ?Temperature (??F): 98.0 ?Height (in): 70 ?Pulse (bpm): 72 ?Weight (lbs): 180.4 ?Respiratory Rate (breaths/min): 16 ?Body Mass Index (BMI): 25.9 ?Blood Pressure (mmHg): 138/74 ?Capillary Blood Glucose (mg/dl): 109 ?Reference Range: 80 - 120 mg / dl ?Electronic Signature(s) ?Signed: 12/25/2021 2:07:54 PM By: Enedina Finner RCP, RRT, CHT ?Entered By: Enedina Finner on 12/25/2021 10:49:24 ?

## 2021-12-26 ENCOUNTER — Encounter: Payer: Medicare Other | Admitting: Physician Assistant

## 2021-12-26 ENCOUNTER — Encounter: Payer: Medicare Other | Admitting: Internal Medicine

## 2021-12-26 ENCOUNTER — Encounter (HOSPITAL_BASED_OUTPATIENT_CLINIC_OR_DEPARTMENT_OTHER): Payer: Medicare Other | Admitting: Internal Medicine

## 2021-12-26 DIAGNOSIS — I2510 Atherosclerotic heart disease of native coronary artery without angina pectoris: Secondary | ICD-10-CM

## 2021-12-26 DIAGNOSIS — E11621 Type 2 diabetes mellitus with foot ulcer: Secondary | ICD-10-CM

## 2021-12-26 DIAGNOSIS — I110 Hypertensive heart disease with heart failure: Secondary | ICD-10-CM | POA: Diagnosis not present

## 2021-12-26 DIAGNOSIS — M86672 Other chronic osteomyelitis, left ankle and foot: Secondary | ICD-10-CM | POA: Diagnosis not present

## 2021-12-26 DIAGNOSIS — L97524 Non-pressure chronic ulcer of other part of left foot with necrosis of bone: Secondary | ICD-10-CM

## 2021-12-26 DIAGNOSIS — I5042 Chronic combined systolic (congestive) and diastolic (congestive) heart failure: Secondary | ICD-10-CM | POA: Diagnosis not present

## 2021-12-26 LAB — GLUCOSE, CAPILLARY
Glucose-Capillary: 174 mg/dL — ABNORMAL HIGH (ref 70–99)
Glucose-Capillary: 126 mg/dL — ABNORMAL HIGH (ref 70–99)

## 2021-12-26 NOTE — Progress Notes (Addendum)
[place], [place] ([id number]) ?Visit Report for 12/26/2021 ?HBO Details ?Patient Name: [name], [name] ?Date of Service: 12/26/2021 8:30 AM ?Medical Record Number: [id number] ?Patient Account Number: [id number] ?Date of Birth/Sex: [birth year] (56 y.o. 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 ?HBO Treatment Details ?Treatment Number: 73 ?Patient Type: Outpatient ?Chamber Type: Monoplace ?Chamber Serial #: [id number] ?Treatment Protocol: 2.0 ATA with 90 minutes oxygen, and no air breaks ?Treatment Details ?Compression Rate Down: 1.0 psi / minute ?De-Compression Rate Up: 1.5 psi / minute ?Compress Tx Pressure Air breaks and breathing periods Decompress Decompress ?Begins Reached (leave unused spaces blank) Begins Ends ?Chamber Pressure (ATA) 1 2 - - - - - - 2 1 ?Clock Time (24 hr) 08:33 08:50 - - - - - - 10:20 10:30 ?Treatment Length: 117 (minutes) ?Treatment Segments: 4 ?Vital Signs ?Capillary Blood Glucose Reference Range: 80 - 120 mg / dl ?HBO Diabetic Blood Glucose Intervention Range: <131 mg/dl or >249 mg/dl ?Time Vitals Blood Respiratory Capillary Blood Glucose Pulse Action ?Type: ?Pulse: Temperature: ?Taken: ?Pressure: ?Rate: ?Glucose (mg/dl): ?Meter #: Oximetry (%) Taken: ?Pre 08:04 138/74 78 16 98.6 174 1 none per protocol ?Post 10:33 138/78 72 16 98.6 126 1 none per protocol ?Treatment Response ?Treatment Toleration: Well ?Treatment Completion ?Treatment Completed without Adverse Event ?Status: ?HBO Attestation ?I certify that I supervised this HBO treatment in accordance with ?Medicare guidelines. A trained emergency response team is  readily Yes ?available per hospital policies and procedures. ?Continue HBOT as ordered. Yes ?Electronic Signature(s) ?Signed: 12/26/2021 2:00:12 PM By: Kalman Shan DO ?Previous Signature: 12/26/2021 11:11:22 AM Version By: Kalman Shan DO ?Previous Signature: 12/26/2021 11:20:06 AM Version By: Enedina Finner RCP, RRT, CHT ?Entered By: Kalman Shan on 12/26/2021 13:59:17 ?[place], [place] ([id number]) ?-------------------------------------------------------------------------------- ?HBO Safety Checklist Details ?Patient Name: [name], [name] ?Date of Service: 12/26/2021 8:30 AM ?Medical Record Number: [id number] ?Patient Account Number: [id number] ?Date of Birth/Sex: February 16, 1966 (56 y.o. M) ?Treating RN: Levora Dredge ?Primary Care Mayank Teuscher: [name] ?Other Clinician: Jacqulyn Bath ?Referring [name]: [name] ?Treating Tierria Watson/Extender: Kalman Shan ?Weeks in Treatment: 8 ?HBO Safety Checklist Items ?Safety Checklist ?Consent Form Signed ?Patient voided / foley secured and emptied ?When did you last eato 07:00 am ?Last dose of injectable or oral agent n/a ?Ostomy pouch emptied and vented if applicable ?NA ?All implantable devices assessed, documented and approved ?NA ?Intravenous access site secured and place ?NA ?Valuables secured ?Linens and cotton and cotton/polyester blend (less than 51% polyester) ?Personal oil-based products / skin lotions / body lotions removed ?Wigs or hairpieces removed ?NA ?Smoking or tobacco materials removed ?NA ?Books / newspapers / magazines / loose paper removed ?NA ?Cologne, aftershave, perfume and deodorant removed ?Jewelry removed (may wrap wedding band) ?Make-up removed ?NA ?Hair care products removed ?Battery operated devices (external) removed ?NA ?Heating patches and chemical warmers removed ?NA ?Titanium eyewear removed ?NA ?Nail polish cured greater than 10 hours ?NA ?Casting material cured greater than 10 hours ?NA ?Hearing aids  removed ?NA ?Loose dentures or partials removed ?NA ?Prosthetics have been removed ?NA ?Patient demonstrates correct use of air break device (if applicable) ?Patient concerns have been addressed ?Patient grounding bracelet on and cord attached to chamber ?Specifics for Inpatients (complete in addition to ?above) ?Medication  sheet sent with patient ?Intravenous medications needed or due during therapy sent with patient ?Drainage tubes (e.g. nasogastric tube or chest tube secured and vented) ?Endotracheal or Tracheotomy tube secured ?Cuff deflated of air and inflated with saline ?Airway suctioned ?Electronic Signature(s) ?Signed: 12/26/2021 11:20:06 AM By: Enedina Finner RCP, RRT, CHT ?Entered By: Enedina Finner on 12/26/2021 08:24:58 ?

## 2021-12-26 NOTE — Progress Notes (Signed)
[place], [place] ([id number]) ?Visit Report for 12/26/2021 ?Arrival Information Details ?Patient Name: Filsaime, Kwaku L. ?Date of Service: 12/26/2021 8:30 AM ?Medical Record Number: [id number] ?Patient Account Number: [id number] ?Date of Birth/Sex: 10-18-1966 (56 y.o. M) ?Treating RN: Levora Dredge ?Primary Care Addasyn Mcbreen: [name] ?Other Clinician: Jacqulyn Bath ?Referring Kamori Barbier: [name] ?Treating Jori Frerichs/Extender: Kalman Shan ?Weeks in Treatment: 8 ?Visit Information History Since Last Visit ?Added or deleted any medications: No ?Patient Arrived: Ambulatory ?Any new allergies or adverse reactions: No ?Arrival Time: 08:00 ?Had a fall or experienced change in No ?Accompanied By: self ?activities of daily living that may affect ?Transfer Assistance: None ?risk of falls: ?Patient Identification Verified: Yes ?Signs or symptoms of abuse/neglect since last visito No ?Secondary Verification Process Completed: Yes ?Hospitalized since last visit: No ?Patient Requires Transmission-Based No ?Has Dressing in Place as Prescribed: Yes ?Precautions: ?Has Footwear/Offloading in Place as Prescribed: Yes ?Patient Has Alerts: Yes ?Left: Surgical Shoe with Pressure ?Patient Alerts: Patient on Blood ?Relief Insole ?Thinner ?Pain Present Now: No ?DIALYSIS RIGHT ARM ?Aspirin 81/Brilinta ?Electronic Signature(s) ?Signed: 12/26/2021 11:20:06 AM By: Enedina Finner RCP, RRT, CHT ?Entered By: Enedina Finner on 12/26/2021 08:22:42 ?[place], [place] ([id number]) ?-------------------------------------------------------------------------------- ?Encounter Discharge Information Details ?Patient Name: Adeyemi, Qusay L. ?Date of Service: 12/26/2021 8:30 AM ?Medical Record Number: [id number] ?Patient Account Number: [id number] ?Date of Birth/Sex: 02-Jan-1966 (56 y.o. M) ?Treating RN: Levora Dredge ?Primary Care Wrenna Saks: [name] ?Other Clinician: Jacqulyn Bath ?Referring Mana Haberl: [name] ?Treating  Hilberto Burzynski/Extender: Kalman Shan ?Weeks in Treatment: 8 ?Encounter Discharge Information Items ?Discharge Condition: Stable ?Ambulatory Status: Ambulatory ?Discharge Destination: Home ?Transportation: Private Auto ?Accompanied By: self ?Schedule Follow-up Appointment: Yes ?Clinical Summary of Care: ?Notes ?Patient has an HBO treatment scheduled on 12/27/21 at 10:30 am. ?Electronic Signature(s) ?Signed: 12/26/2021 11:20:06 AM By: Enedina Finner RCP, RRT, CHT ?Entered By: Enedina Finner on 12/26/2021 10:51:10 ?[place], [place] ([id number]) ?-------------------------------------------------------------------------------- ?Vitals Details ?Patient Name: Charon, Cassiel L. ?Date of Service: 12/26/2021 8:30 AM ?Medical Record Number: [id number] ?Patient Account Number: [id number] ?Date of Birth/Sex: Mar 16, 1966 (56 y.o. M) ?Treating RN: Levora Dredge ?Primary Care Kaitlin Ardito: [name] ?Other Clinician: Jacqulyn Bath ?Referring Yuko Coventry: [name] ?Treating Oriah Leinweber/Extender: Kalman Shan ?Weeks in Treatment: 8 ?Vital Signs ?Time Taken: 08:04 ?Temperature (??F): 98.6 ?Height (in): 70 ?Pulse (bpm): 78 ?Weight (lbs): 180.4 ?Respiratory Rate (breaths/min): 16 ?Body Mass Index (BMI): 25.9 ?Blood Pressure (mmHg): 138/74 ?Capillary Blood Glucose (mg/dl): 174 ?Reference Range: 80 - 120 mg / dl ?Electronic Signature(s) ?Signed: 12/26/2021 11:20:06 AM By: Enedina Finner RCP, RRT, CHT ?Entered By: Enedina Finner on 12/26/2021 08:23:19 ?

## 2021-12-26 NOTE — Progress Notes (Signed)
[place], [place] ([id number]) Visit Report for 12/26/2021 Arrival Information Details Patient Name: [name], [name]. Date of Service: 12/26/2021 10:30 AM Medical Record Number: [id number] Patient Account Number: [id number] Date of Birth/Sex: 1966/10/09 (56 y.o. M) Treating RN: Levora Dredge Primary Care Caelin Rosen: [name] Other Clinician: Referring Micaylah Bertucci: [name] Treating Lorren Rossetti/Extender: Yaakov Guthrie in Treatment: 8 Visit Information History Since Last Visit Added or deleted any medications: No Patient Arrived: Ambulatory Any new allergies or adverse reactions: No Arrival Time: 10:45 Had a fall or experienced change in No Accompanied By: self activities of daily living that may affect Transfer Assistance: None risk of falls: Patient Identification Verified: Yes Hospitalized since last visit: No Secondary Verification Process Completed: Yes Has Dressing in Place as Prescribed: Yes Patient Requires Transmission-Based No Has Footwear/Offloading in Place as Prescribed: Yes Precautions: Pain Present Now: Yes Patient Has Alerts: Yes Patient Alerts: Patient on Blood Thinner DIALYSIS RIGHT ARM Aspirin 81/Brilinta Electronic Signature(s) Signed: 12/26/2021 4:18:50 PM By: Levora Dredge Entered By: Levora Dredge on 12/26/2021 10:46:11 Tapp, Xan L. ([id number]) -------------------------------------------------------------------------------- Clinic Level of Care Assessment Details Patient Name: [name], [name] Date of Service: 12/26/2021 10:30 AM Medical Record Number: [id number] Patient Account Number: [id number] Date of Birth/Sex: 09/24/1966 (56 y.o. M) Treating RN: Levora Dredge Primary Care Laurin Paulo: [name] Other Clinician: Referring Chizara Mena: [name] Treating Jaylena Holloway/Extender: Yaakov Guthrie in Treatment: 8 Clinic Level of Care Assessment Items TOOL 1 Quantity Score []  - Use when EandM and Procedure is performed on INITIAL  visit 0 ASSESSMENTS - Nursing Assessment / Reassessment []  - General Physical Exam (combine w/ comprehensive assessment (listed just below) when performed on new 0 pt. evals) []  - 0 Comprehensive Assessment (HX, ROS, Risk Assessments, Wounds Hx, etc.) ASSESSMENTS - Wound and Skin Assessment / Reassessment []  - Dermatologic / Skin Assessment (not related to wound area) 0 ASSESSMENTS - Ostomy and/or Continence Assessment and Care []  - Incontinence Assessment and Management 0 []  - 0 Ostomy Care Assessment and Management (repouching, etc.) PROCESS - Coordination of Care []  - Simple Patient / Family Education for ongoing care 0 []  - 0 Complex (extensive) Patient / Family Education for ongoing care []  - 0 Staff obtains Programmer, systems, Records, Test Results / Process Orders []  - 0 Staff telephones HHA, Nursing Homes / Clarify orders / etc []  - 0 Routine Transfer to another Facility (non-emergent condition) []  - 0 Routine Hospital Admission (non-emergent condition) []  - 0 New Admissions / Biomedical engineer / Ordering NPWT, Apligraf, etc. []  - 0 Emergency Hospital Admission (emergent condition) PROCESS - Special Needs []  - Pediatric / Minor Patient Management 0 []  - 0 Isolation Patient Management []  - 0 Hearing / Language / Visual special needs []  - 0 Assessment of Community assistance (transportation, D/C planning, etc.) []  - 0 Additional assistance / Altered mentation []  - 0 Support Surface(s) Assessment (bed, cushion, seat, etc.) INTERVENTIONS - Miscellaneous []  - External ear exam 0 []  - 0 Patient Transfer (multiple staff / Civil Service fast streamer / Similar devices) []  - 0 Simple Staple / Suture removal (25 or less) []  - 0 Complex Staple / Suture removal (26 or more) []  - 0 Hypo/Hyperglycemic Management (do not check if billed separately) []  - 0 Ankle / Brachial Index (ABI) - do not check if billed separately Has the patient been seen at the hospital within the last three years:  Yes Total Score: 0 Level Of Care: ____ Marengo, Youcef L. ([id number]) Electronic Signature(s) Signed: 12/26/2021 4:18:50 PM By: Levora Dredge Entered By: Araceli Bouche,  Caitlin on 12/26/2021 11:46:07 Saxe, Jaye L. ([id number]) -------------------------------------------------------------------------------- Lower Extremity Assessment Details Patient Name: [name], [name] Date of Service: 12/26/2021 10:30 AM Medical Record Number: [id number] Patient Account Number: [id number] Date of Birth/Sex: 06/01/1966 (56 y.o. M) Treating RN: Levora Dredge Primary Care Brack Shaddock: [name] Other Clinician: Referring Oluwaseyi Raffel: [name] Treating Kassey Laforest/Extender: Yaakov Guthrie in Treatment: 8 Edema Assessment Assessed: [Left: No] Patrice Paradise: No] Edema: [Left: Ye] [Right: s] Calf Left: Right: Point of Measurement: 33 cm From Medial Instep 37.7 cm Ankle Left: Right: Point of Measurement: 10 cm From Medial Instep 26.8 cm Vascular Assessment Pulses: Dorsalis Pedis Palpable: [Left:Yes] Electronic Signature(s) Signed: 12/26/2021 4:18:50 PM By: Levora Dredge Entered By: Levora Dredge on 12/26/2021 10:58:57 Macken, Joh L. ([id number]) -------------------------------------------------------------------------------- Multi Wound Chart Details Patient Name: [name], [name] Date of Service: 12/26/2021 10:30 AM Medical Record Number: [id number] Patient Account Number: [id number] Date of Birth/Sex: 04/07/1966 (56 y.o. 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 Comorbid History: Cataracts, Anemia, Sleep Apnea, Cataracts, Anemia, Sleep Apnea, N/A Arrhythmia, Congestive Heart Arrhythmia, Congestive Heart Failure, Coronary Artery Disease, Failure, Coronary Artery Disease, Hypertension, Myocardial Infarction, Hypertension, Myocardial Infarction, Type II Diabetes, Neuropathy, Type II Diabetes, Neuropathy, Seizure Disorder Seizure Disorder Date Acquired: 09/09/2021 10/28/2021 N/A Weeks of Treatment: 4 4 N/A Wound Status: Open Open N/A Wound Recurrence: No No N/A Measurements L x W x D (cm) 3.4x2x0.7 2.2x1.7x0.1 N/A Area (cm) : 5.341 2.937 N/A Volume (cm) : 3.738 0.294 N/A % Reduction in Area: 62.20% 6.50% N/A % Reduction in Volume: 90.90% 6.40% N/A Classification: Full Thickness With Exposed Unable to visualize wound bed N/A Support Structures Exudate Amount: Medium None Present N/A Exudate Type: Serosanguineous N/A N/A Exudate Color: red, brown N/A N/A Granulation Amount: Medium (34-66%) None Present (0%) N/A Granulation Quality: Red N/A N/A Necrotic Amount: Medium (34-66%) Large (67-100%) N/A Necrotic Tissue: Adherent Slough Eschar N/A Exposed Structures: Fascia: Yes Fascia: No N/A Fat Layer (Subcutaneous Tissue): Fat Layer (Subcutaneous Tissue): Yes No Tendon: Yes Tendon: No Muscle: Yes Muscle: No Joint: No Joint: No Bone: No Bone: No Epithelialization: None None N/A Treatment Notes Electronic Signature(s) Biby, Amando L. ([id number]) Signed: 12/26/2021 4:18:50 PM By: Levora Dredge Entered By: Levora Dredge on 12/26/2021 11:00:56 Zweber, Rondell L. ([id number]) -------------------------------------------------------------------------------- [place] Details Patient Name: Trivett, NORMON PETTIJOHN. Date of Service: 12/26/2021 10:30 AM Medical Record Number: [id number] Patient Account Number: [id number] Date of Birth/Sex:  1966-08-29 (56 y.o. M) Treating RN: Levora Dredge Primary Care Gautham Hewins: [name] Other Clinician: Referring Tenille Morrill: [name] Treating Kyler Germer/Extender: Yaakov Guthrie in Treatment: 8 Active Inactive HBO Nursing Diagnoses: Anxiety related to feelings of confinement associated with the hyperbaric oxygen chamber Anxiety related to knowledge deficit of hyperbaric oxygen therapy and treatment procedures Discomfort related to temperature and humidity changes inside hyperbaric chamber Potential for barotraumas to ears, sinuses, teeth, and lungs or cerebral gas embolism related to changes in atmospheric pressure inside hyperbaric oxygen chamber Potential for oxygen toxicity seizures related to delivery of 100% oxygen at an increased atmospheric pressure Potential for pulmonary oxygen toxicity related to delivery of 100% oxygen at an increased atmospheric  pressure Goals: Barotrauma will be prevented during HBO2 Date Initiated: 10/31/2021 Target Resolution Date: 12/06/2021 Goal Status: Active Patient and/or family will be able to state/discuss factors appropriate to the management of their disease process during treatment Date Initiated: 10/31/2021 Target Resolution Date: 11/15/2021 Goal Status: Active Patient will tolerate the hyperbaric oxygen therapy treatment Date Initiated: 10/31/2021 Target Resolution Date: 12/06/2021 Goal Status: Active Patient will tolerate the internal climate of the chamber Date Initiated: 10/31/2021 Target Resolution Date: 11/29/2021 Goal Status: Active Patient/caregiver will verbalize understanding of HBO goals, rationale, procedures and potential hazards Date Initiated: 10/31/2021 Target Resolution Date: 11/19/2021 Goal Status: Active Signs and symptoms of pulmonary oxygen toxicity will be recognized and promptly addressed Date Initiated: 10/31/2021 Target Resolution Date: 12/13/2021 Goal Status: Active Signs and symptoms of seizure will be  recognized and promptly addressed ; seizing patients will suffer no harm Date Initiated: 10/31/2021 Target Resolution Date: 12/20/2021 Goal Status: Active Interventions: Administer a five (5) minute air break for patient if signs and symptoms of seizure appear and notify the hyperbaric physician Administer a ten (10) minute air break for patient if signs and symptoms of seizure appear and notify the hyperbaric physician Administer the correct therapeutic gas delivery based on the patients needs and limitations, per physician order Assess and provide for patientos comfort related to the hyperbaric environment and equalization of middle ear Assess for signs and symptoms related to adverse events, including but not limited to confinement anxiety, pneumothorax, oxygen toxicity and baurotrauma Assess patient for any history of confinement anxiety Assess patient's knowledge and expectations regarding hyperbaric medicine and provide education related to the hyperbaric environment, goals of treatment and prevention of adverse events Notes: Necrotic Tissue Nursing Diagnoses: Impaired tissue integrity related to necrotic/devitalized tissue Knowledge deficit related to management of necrotic/devitalized tissue Lasecki, Kyjuan L. ([id number]) Goals: Necrotic/devitalized tissue will be minimized in the wound bed Date Initiated: 11/28/2021 Target Resolution Date: 11/28/2021 Goal Status: Active Patient/caregiver will verbalize understanding of reason and process for debridement of necrotic tissue Date Initiated: 11/28/2021 Target Resolution Date: 11/28/2021 Goal Status: Active Interventions: Assess patient pain level pre-, during and post procedure and prior to discharge Provide education on necrotic tissue and debridement process Treatment Activities: Apply topical anesthetic as ordered : 11/28/2021 Excisional debridement : 11/28/2021 Notes: Wound/Skin Impairment Nursing Diagnoses: Impaired tissue  integrity Knowledge deficit related to smoking impact on wound healing Knowledge deficit related to ulceration/compromised skin integrity Goals: Ulcer/skin breakdown will have a volume reduction of 30% by week 4 Date Initiated: 11/28/2021 Target Resolution Date: 12/12/2021 Goal Status: Active Interventions: Assess patient/caregiver ability to obtain necessary supplies Assess patient/caregiver ability to perform ulcer/skin care regimen upon admission and as needed Assess ulceration(s) every visit Treatment Activities: Topical wound management initiated : 11/28/2021 Notes: Electronic Signature(s) Signed: 12/26/2021 4:18:50 PM By: Levora Dredge Entered By: Levora Dredge on 12/26/2021 11:00:47 Pepper, Leilan L. ([id number]) -------------------------------------------------------------------------------- Pain Assessment Details Patient Name: Mannis, Omari L. Date of Service: 12/26/2021 10:30 AM Medical Record Number: [id number] Patient Account Number: [id number] Date of Birth/Sex: 15-Jun-1966 (57 y.o. M) Treating RN: Levora Dredge Primary Care Junette Bernat: [name] Other Clinician: Referring Londell Noll: [name] Treating Dianelly Ferran/Extender: Yaakov Guthrie in Treatment: 8 Active Problems Location of Pain Severity and Description of Pain Patient Has Paino Yes Site Locations Rate the pain. Current Pain Level: 7 Pain Management and Medication Current Pain Management: Notes pt states pain in left foot Electronic Signature(s) Signed: 12/26/2021 4:18:50 PM By: Levora Dredge Entered By: Levora Dredge on 12/26/2021 10:47:10 Magee, Brayten L. ([id number]) --------------------------------------------------------------------------------  Wound Assessment Details Patient Name: Sparano, Braelon L. Date of Service: 12/26/2021 10:30 AM Medical Record Number: [id number] Patient Account Number: [id number] Date of Birth/Sex: [birth year] (56 y.o. M) Treating RN: Levora Dredge Primary Care  Corky Blumstein: [name] Other Clinician: Referring Anastyn Ayars: [name] Treating Lilyahna Sirmon/Extender: Yaakov Guthrie in Treatment: 8 Wound Status Wound Number: 1 Primary Open Surgical Wound Etiology: Wound Location: Left Amputation Site - Digit Wound Open Wounding Event: Surgical Injury Status: Date Acquired: 09/09/2021 Comorbid Cataracts, Anemia, Sleep Apnea, Arrhythmia, Congestive Weeks Of Treatment: 4 History: Heart Failure, Coronary Artery Disease, Hypertension, Clustered Wound: No Myocardial Infarction, Type II Diabetes, Neuropathy, Seizure Disorder Photos Wound Measurements Length: (cm) 3.4 Width: (cm) 2 Depth: (cm) 0.7 Area: (cm) 5.341 Volume: (cm) 3.738 % Reduction in Area: 62.2% % Reduction in Volume: 90.9% Epithelialization: None Tunneling: No Undermining: No Wound Description Classification: Full Thickness With Exposed Support Structures Exudate Amount: Medium Exudate Type: Serosanguineous Exudate Color: red, brown Foul Odor After Cleansing: No Slough/Fibrino Yes Wound Bed Granulation Amount: Medium (34-66%) Exposed Structure Granulation Quality: Red Fascia Exposed: Yes Necrotic Amount: Medium (34-66%) Fat Layer (Subcutaneous Tissue) Exposed: Yes Necrotic Quality: Adherent Slough Tendon Exposed: Yes Muscle Exposed: Yes Necrosis of Muscle: No Joint Exposed: No Bone Exposed: No Electronic Signature(s) Signed: 12/26/2021 4:18:50 PM By: Levora Dredge Entered By: Levora Dredge on 12/26/2021 10:56:52 Thorner, Gerome L. ([id number]) -------------------------------------------------------------------------------- Wound Assessment Details Patient Name: Jowett, Pierson L. Date of Service: 12/26/2021 10:30 AM Medical Record Number: [id number] Patient Account Number: [id number] Date of Birth/Sex: 05/17/1966 (56 y.o. M) Treating RN: Levora Dredge Primary Care Lynnda Wiersma: [name] Other Clinician: Referring Brenn Gatton: [name] Treating  Nycole [name]/Extender: Yaakov Guthrie in Treatment: 8 Wound Status Wound Number: 2 Primary Diabetic Wound/Ulcer of the Lower Extremity Etiology: Wound Location: Left, Lateral Foot Wound Open Wounding Event: Blister Status: Date Acquired: 10/28/2021 Comorbid Cataracts, Anemia, Sleep Apnea, Arrhythmia, Congestive Weeks Of Treatment: 4 History: Heart Failure, Coronary Artery Disease, Hypertension, Clustered Wound: No Myocardial Infarction, Type II Diabetes, Neuropathy, Seizure Disorder Photos Wound Measurements Length: (cm) 2.2 Width: (cm) 1.7 Depth: (cm) 0.1 Area: (cm) 2.937 Volume: (cm) 0.294 % Reduction in Area: 6.5% % Reduction in Volume: 6.4% Epithelialization: None Tunneling: No Undermining: No Wound Description Classification: Unable to visualize wound bed Exudate Amount: None Present Foul Odor After Cleansing: No Slough/Fibrino Yes Wound Bed Granulation Amount: None Present (0%) Exposed Structure Necrotic Amount: Large (67-100%) Fascia Exposed: No Necrotic Quality: Eschar Fat Layer (Subcutaneous Tissue) Exposed: No Tendon Exposed: No Muscle Exposed: No Joint Exposed: No Bone Exposed: No Electronic Signature(s) Signed: 12/26/2021 4:18:50 PM By: Levora Dredge Entered By: Levora Dredge on 12/26/2021 10:57:34 Un, Johnnathan L. ([id number]) -------------------------------------------------------------------------------- Vitals Details Patient Name: Barber, [name] Date of Service: 12/26/2021 10:30 AM Medical Record Number: [id number] Patient Account Number: [id number] Date of Birth/Sex: Apr 05, 1966 (56 y.o. M) Treating RN: Levora Dredge Primary Care Ashton Sabine: [name] Other Clinician: Referring Gwenda Heiner: [name] Treating Medardo Hassing/Extender: Yaakov Guthrie in Treatment: 8 Vital Signs Time Taken: 10:46 Temperature (F): 98.6 Height (in): 70 Pulse (bpm): 72 Weight (lbs): 180.4 Respiratory Rate (breaths/min): 18 Body Mass Index (BMI):  25.9 Blood Pressure (mmHg): 138/78 Reference Range: 80 - 120 mg / dl Electronic Signature(s) Signed: 12/26/2021 4:18:50 PM By: Levora Dredge Entered By: Levora Dredge on 12/26/2021 10:46:44

## 2021-12-26 NOTE — Progress Notes (Signed)
E[address], [place] ([id number]) Visit Report for 12/26/2021 Chief Complaint Document Details Patient Name: [name], [name]. Date of Service: 12/26/2021 10:30 AM Medical Record Number: [id number] Patient Account Number: [id number] Date of Birth/Sex: [birth year] (56 y.o. M) Treating RN: Levora Dredge Primary Care Provider: [name] Other Clinician: Referring Provider: [name] Treating Provider/Extender: Yaakov Guthrie in Treatment: 8 Information Obtained from: Patient Chief Complaint HBO evaluation for treatment on the left foot diabetic ulcer with osteomyelitis. Patient is currently under our care as well for his foot. Electronic Signature(s) Signed: 12/26/2021 11:11:22 AM By: Kalman Shan DO Entered By: Kalman Shan on 12/26/2021 11:07:35 Grieshaber, [place] ([id number]) -------------------------------------------------------------------------------- Debridement Details Patient Name: [name], [name] Date of Service: 12/26/2021 10:30 AM Medical Record Number: [id number] Patient Account Number: [id number] Date of Birth/Sex: Oct 12, 1966 (56 y.o. M) Treating RN: Levora Dredge Primary Care Provider: [name] Other Clinician: Referring Provider: [name] Treating Provider/Extender: Yaakov Guthrie in Treatment: 8 Debridement Performed for Wound #1 Left Amputation Site - Digit Assessment: Performed By: Physician Kalman Shan, MD Debridement Type: Debridement Level of Consciousness (Pre- Awake and Alert procedure): Pre-procedure Verification/Time Out Yes - 11:01 Taken: Total Area Debrided (L x W): 3.4 (cm) x 2 (cm) = 6.8 (cm) Tissue and other material Viable, Non-Viable, Slough, Subcutaneous, Slough debrided: Level: Skin/Subcutaneous Tissue Debridement Description: Excisional Instrument: Curette Bleeding: Moderate Hemostasis Achieved: Surgicel Response to Treatment: Procedure was tolerated well Level of Consciousness (Post- Awake and  Alert procedure): Post Debridement Measurements of Total Wound Length: (cm) 3.4 Width: (cm) 2 Depth: (cm) 0.7 Volume: (cm) 3.738 Character of Wound/Ulcer Post Debridement: Stable Post Procedure Diagnosis Same as Pre-procedure Electronic Signature(s) Signed: 12/26/2021 11:11:22 AM By: Kalman Shan DO Signed: 12/26/2021 4:18:50 PM By: Levora Dredge Entered By: Levora Dredge on 12/26/2021 11:04:45 Ludke, Kaoru L. ([id number]) -------------------------------------------------------------------------------- HPI Details Patient Name: [name], [name] Date of Service: 12/26/2021 10:30 AM Medical Record Number: [id number] Patient Account Number: [id number] Date of Birth/Sex: 06-20-1966 (56 y.o. M) Treating RN: Levora Dredge Primary Care Provider: [name] Other Clinician: Referring Provider: [name] Treating Provider/Extender: Yaakov Guthrie in Treatment: 8 History of Present Illness HPI Description: 10/31/2021 patient presents today for evaluation here in our clinic. He is actually being evaluated for hyperbaric oxygen therapy only. He has been seen at [facility] up to this point he is under the care of the vascular surgery specialty. Subsequently he is also under the care of the hyperbaric center there. With that being said that due to the fact that he is a dialysis patient he was actually recommended to be transferred to [place] for his treatments he actually lives in [place] which is where his dialysis is. It was a hardship for him to try to get from [place] especially on dialysis days all the way to [facility] get into the facility for his treatment and get back home he was literally spending all day long. He has had just 4 treatments there at [facility] thus far based on what I see in these have not been continued while he awaits approval here in our clinic. Subsequently I do have records for review that will be included in the HPI predetermination review and  attached to this chart as well. I will not duplicate that here. 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 Nonetheless he continues to have exposed bone with  marked soft tissue swelling compatible with osteomyelitis. This is due to the irregularity of the head of the metatarsal as well. Nonetheless along with having associated cellulitis he is also good to be seen by infectious disease and is currently on antibiotics for this as well. Again that will be detailed in the pretreatment review section which I will attach to this note as well. He does currently have a wound VAC in place and again the wound was not evaluated here in the clinic as that is being completely managed by home health and [facility]. The patient does have a history of diabetes mellitus type 2, hypertension, coronary artery disease, and congestive heart failure. He also has cataracts of both eyes but no evidence of glaucoma on his most recent eye exam. 11/22/2021 we have been seeing this gentleman for hyperbaric oxygen therapy but have not actually evaluated his wound up to this point this was being managed by the wound care and vascular team I presumed at [facility] at least that is what has been told to be previous. Nonetheless at this point I got a call from Atlee Abide who is a Librarian, academic at the [facility] vascular clinic with Dr. Durene Fruits and subsequently they were wanting to know if I could take over wound care for this patient. Apparently they do not have the ability for an office debridement which again I completely understand but nonetheless definitely is not an integral part of his healing along with the hyperbarics. Subsequently I discussed with the patient today that I do believe he would benefit going ahead and proceeding with evaluation here in the clinic for that  reason I did go ahead and see him today to get things started. There is also been some confusion about the issues with his home health nurse and getting measurements to [facility] for the wound VAC in order to continue his wound VAC therapy. With that being said after I see him today we will make a determination on what to do with wound VAC we can definitely send measurements to [facility] but again the bigger question is whether this is the best way to go or not. 11/28/2021 upon evaluation today patient appears to be doing decently well in regard to his wound. He has been tolerating the dressing changes with the Dakin's moistened gauze dressing which I think is doing a good job. Fortunately I do not see any signs of active infection locally nor systemically at this point. In general I think that he is actually making excellent progress which is great news. 2/17; seen in conjunction with HBO today. 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 Much improved condition of the wound surface. On the lateral foot and eschared area that he has been using Betadine. Tolerating HBO well 12/13/2021 upon evaluation today patient appears to be doing pretty well in regard to his foot ulcer. I am actually much more pleased currently that I been with the way the appearance looks today. The wound bed does show some signs of slough and biofilm on the surface of the wound this is minimal I Minna perform debridement the I will do so very carefully due to the fact that to be honest the patient had a significant amount of bleeding in the past being on the Brilinta he had a difficult time clotting when I debrided him previously quite significantly. Fortunately I do not see any signs of active infection at this time which is great  news. No fevers, chills, nausea, vomiting, or diarrhea. 12/20/2021 upon evaluation today patient actually appears to be showing some signs of excellent improvement here. I am very  pleased with where things stand currently. There does not appear to be any evidence of active infection locally nor systemically which is great news and I do believe that with the hyperbaric oxygen therapy he is actually making excellent progress. 3/9; patient presents for follow-up. He continues to do HBO therapy without issues. He has completed 34 out of 40 sessions. He continues to use Dakin's wet-to-dry to the amputation site and Betadine to the lateral foot wound. he currently denies signs of infection. Electronic Signature(s) Signed: 12/26/2021 11:11:22 AM By: Kalman Shan DO Entered By: Kalman Shan on 12/26/2021 11:08:18 Palos, Jjesus L. ([id number]) -------------------------------------------------------------------------------- Physical Exam Details Patient Name: Wirz, Sirius L. Date of Service: 12/26/2021 10:30 AM Medical Record Number: [id number] Patient Account Number: [id number] Date of Birth/Sex: June 15, 1966 (56 y.o. M) Treating RN: Levora Dredge Primary Care Provider: [name] Other Clinician: Referring Provider: [name] Treating Provider/Extender: Yaakov Guthrie in Treatment: 8 Constitutional . Cardiovascular . Psychiatric . Notes Left foot: To the amputation site there is dehiscence and an open wound with granulation tissue and nonviable tissue present. No surrounding signs of infection. To the lateral aspect there is an eschared area upon no surrounding signs of infection. Electronic Signature(s) Signed: 12/26/2021 11:11:22 AM By: Kalman Shan DO Entered By: Kalman Shan on 12/26/2021 11:09:15 Bujak, Olivia L. ([id number]) -------------------------------------------------------------------------------- Physician Orders Details Patient Name: Krise, Ulrich L. Date of Service: 12/26/2021 10:30 AM Medical Record Number: [id number] Patient Account Number: [id number] Date of Birth/Sex: 1966/07/21 (56 y.o. M) Treating RN: Levora Dredge Primary  Care Provider: [name] Other Clinician: Referring Provider: [name] Treating Provider/Extender: Yaakov Guthrie in Treatment: 8 Verbal / Phone Orders: No Diagnosis Coding ICD-10 Coding Code Description E11.621 Type 2 diabetes mellitus with foot ulcer 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e M86.672 Other chronic osteomyelitis, left ankle and foot I10 Essential (primary) hypertension I25.10 Atherosclerotic heart disease of native coronary artery without angina pectoris I50.42 Chronic combined systolic (congestive) and diastolic (congestive) heart failure Follow-up Appointments o Return Appointment in 1 week. - provider visits at wound center o Return Appointment in: - HBO- (daily Monday -Friday for HBO Treatments) [place] for wound care. May utilize formulary equivalent dressing for wound treatment orders unless otherwise specified. Home Health Nurse may visit PRN to address patientos wound care needs. o **Please direct any NON-WOUND related issues/requests for orders to patient's Primary Care Physician. **If current dressing causes regression in wound condition, may D/C ordered dressing product/s and apply Normal Saline Moist Dressing daily until next [place] or Other MD appointment. **Notify Wound Healing Center of regression in wound condition at [phone]. o Other Home Health Orders/Instructions: - HOLD WOUND VAC AT THIS TIME Bathing/ Shower/ Hygiene o Clean wound with Normal Saline or wound cleanser. o May shower with wound dressing protected with water repellent cover or cast protector. o No tub bath. Additional Orders / Instructions o Follow Nutritious Diet and Increase Protein Intake o Other: - Follow vascular, nephrology and PCP for wound care and other needs Dialysis appt as scheduled M-W-F Hyperbaric Oxygen  Therapy o Indication and location: - Left 1st and 2nd Ray Amputation osteomyelitis o If appropriate for treatment, begin HBOT per protocol: o 2.0 ATA for 90 Minutes without Air  Breaks o One treatment per day (delivered Monday through Friday unless otherwise specified in Special Instructions below): o Total # of Treatments: - 40 o Finger stick Blood Glucose Pre- and Post- HBOT Treatment. o Follow Hyperbaric Oxygen Glycemia Protocol Wound Treatment Wound #1 - Amputation Site - Digit Wound Laterality: Left Cleanser: Normal Saline 1 x Per Day/30 Days Discharge Instructions: Wash your hands with soap and water. Remove old dressing, discard into plastic bag and place into trash. Cleanse the wound with Normal Saline prior to applying a clean dressing using gauze sponges, not tissues or cotton balls. Do not scrub or use excessive force. Pat dry using gauze sponges, not tissue or cotton balls. Cleanser: Vashe 5.8 (oz) 1 x Per Day/30 Days Discharge Instructions: Use vashe 5.8 (oz) as directed Cleanser: Wound Cleanser 1 x Per Day/30 Days Discharge Instructions: Wash your hands with soap and water. Remove old dressing, discard into plastic bag and place into trash. Cleanse the wound with Wound Cleanser prior to applying a clean dressing using gauze sponges, not tissues or cotton balls. Do not scrub or use excessive force. Pat dry using gauze sponges, not tissue or cotton balls. Miguez, Beattie ([id number]) Primary Dressing: Gauze 1 x Per Day/30 Days Discharge Instructions: VASHE/DAKINS-As directed: dry, moistened with saline or moistened with Dakins Solution Secondary Dressing: ABD Pad 5x9 (in/in) 1 x Per Day/30 Days Discharge Instructions: Cover with ABD pad Secured With: Medipore Tape - 33M Medipore H Soft Cloth Surgical Tape, 2x2 (in/yd) 1 x Per Day/30 Days Secured With: Kerlix Roll Sterile or Non-Sterile 6-ply 4.5x4 (yd/yd) 1 x Per Day/30 Days Discharge Instructions: Apply Kerlix as  directed Wound #2 - Foot Wound Laterality: Left, Lateral Topical: Betadine Discharge Instructions: paint wound bed, allow to dry Secondary Dressing: Kerlix 4.5 x 4.1 (in/yd) Discharge Instructions: lightly GLYCEMIA INTERVENTIONS PROTOCOL PRE-HBO GLYCEMIA INTERVENTIONS ACTION INTERVENTION Obtain pre-HBO capillary blood glucose (ensure 1 physician order is in chart). A. Notify HBO physician and await physician orders. 2 If result is 70 mg/dl or below: B. If the result meets the hospital definition of a critical result, follow hospital policy. A. Give patient an 8 ounce Glucerna Shake, an 8 ounce Ensure, or 8 ounces of a Glucerna/Ensure equivalent dietary supplement*. B. Wait 30 minutes. If result is 71 mg/dl to 130 mg/dl: C. Retest patientos capillary blood glucose (CBG). D. If result greater than or equal to 110 mg/dl, proceed with HBO. If result less than 110 mg/dl, notify HBO physician and consider holding HBO. If result is 131 mg/dl to 249 mg/dl: A. Proceed with HBO. A. Notify HBO physician and await physician orders. B. It is recommended to hold HBO and do If result is 250 mg/dl or greater: blood/urine ketone testing. C. If the result meets the hospital definition of a critical result, follow hospital policy. POST-HBO GLYCEMIA INTERVENTIONS ACTION INTERVENTION Obtain post HBO capillary blood glucose (ensure 1 physician order is in chart). A. Notify HBO physician and await physician orders. 2 If result is 70 mg/dl or below: B. If the result meets the hospital definition of a critical result, follow hospital policy. A. Give patient an 8 ounce Glucerna Shake, an 8 ounce Ensure, or 8 ounces of a Glucerna/Ensure equivalent dietary supplement*. B. Wait 15 minutes for symptoms of hypoglycemia (i.e. nervousness, anxiety, If result is 71 mg/dl to 100 mg/dl: sweating, chills, clamminess, irritability, confusion, tachycardia or dizziness). C. If patient  asymptomatic, discharge patient. If patient symptomatic, repeat capillary blood glucose (CBG) and notify HBO physician. If result is  101 mg/dl to 249 mg/dl: A. Discharge patient. If result is 250 mg/dl or greater: A. Notify HBO physician and await physician orders. B. It is recommended to do blood/urine ketone testing. Donati, [place] ([id number]) C. If the result meets the hospital definition of a critical result, follow hospital policy. *Juice or candies are NOT equivalent products. If patient refuses the Glucerna or Ensure, please consult the hospital dietitian for an appropriate substitute. Electronic Signature(s) Signed: 12/26/2021 11:45:52 AM By: Levora Dredge Signed: 12/26/2021 12:52:37 PM By: Kalman Shan DO Entered By: Levora Dredge on 12/26/2021 11:45:51 Kron, Menard ([id number]) -------------------------------------------------------------------------------- Problem List Details Patient Name: Lasota, Ignazio L. Date of Service: 12/26/2021 10:30 AM Medical Record Number: [id number] Patient Account Number: [id number] Date of Birth/Sex: 1966/06/30 (56 y.o. M) Treating RN: Levora Dredge Primary Care Provider: [name] Other Clinician: Referring Provider: [name] Treating Provider/Extender: Yaakov Guthrie in Treatment: 8 Active Problems ICD-10 Encounter Code Description Active Date MDM Diagnosis E11.621 Type 2 diabetes mellitus with foot ulcer 10/31/2021 No Yes L97.524 Non-pressure chronic ulcer of other part of left foot with necrosis of 10/31/2021 No Yes bone M86.672 Other chronic osteomyelitis, left ankle and foot 10/31/2021 No Yes I10 Essential (primary) hypertension 10/31/2021 No Yes I25.10 Atherosclerotic heart disease of native coronary artery without angina 10/31/2021 No Yes pectoris I50.42 Chronic combined systolic (congestive) and diastolic (congestive) heart 10/31/2021 No Yes failure Inactive Problems Resolved Problems Electronic  Signature(s) Signed: 12/26/2021 11:11:22 AM By: Kalman Shan DO Entered By: Kalman Shan on 12/26/2021 11:07:18 Winch, Blanchard ([id number]) -------------------------------------------------------------------------------- Progress Note Details Patient Name: Greenley, Troyce L. Date of Service: 12/26/2021 10:30 AM Medical Record Number: [id number] Patient Account Number: [id number] Date of Birth/Sex: 05-Jan-1966 (56 y.o. M) Treating RN: Levora Dredge Primary Care Provider: [name] Other Clinician: Referring Provider: [name] Treating Provider/Extender: Yaakov Guthrie in Treatment: 8 Subjective Chief Complaint Information obtained from Patient HBO evaluation for treatment on the left foot diabetic ulcer with osteomyelitis. Patient is currently under our care as well for his foot. History of Present Illness (HPI) 10/31/2021 patient presents today for evaluation here in our clinic. He is actually being evaluated for hyperbaric oxygen therapy only. He has been seen at [facility] up to this point he is under the care of the vascular surgery specialty. Subsequently he is also under the care of the hyperbaric center there. With that being said that due to the fact that he is a dialysis patient he was actually recommended to be transferred to [place] for his treatments he actually lives in [place] which is where his dialysis is. It was a hardship for him to try to get from [place] especially on dialysis days all the way to [facility] get into the facility for his treatment and get back home he was literally spending all day long. He has had just 4 treatments there at [facility] thus far based on what I see in these have not been continued while he awaits approval here in our clinic. Subsequently I do have records for review that will be included in the HPI predetermination review and attached to this chart as well. I will not duplicate that here. 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 Nonetheless he continues to have exposed bone with marked soft tissue swelling compatible with osteomyelitis. This is due to the irregularity of the  head of the metatarsal as well. Nonetheless along with having associated cellulitis he is also good to be seen by infectious disease and is currently on antibiotics for this as well. Again that will be detailed in the pretreatment review section which I will attach to this note as well. He does currently have a wound VAC in place and again the wound was not evaluated here in the clinic as that is being completely managed by home health and [facility]. The patient does have a history of diabetes mellitus type 2, hypertension, coronary artery disease, and congestive heart failure. He also has cataracts of both eyes but no evidence of glaucoma on his most recent eye exam. 11/22/2021 we have been seeing this gentleman for hyperbaric oxygen therapy but have not actually evaluated his wound up to this point this was being managed by the wound care and vascular team I presumed at [facility] at least that is what has been told to be previous. Nonetheless at this point I got a call from Atlee Abide who is a Librarian, academic at the [facility] vascular clinic with Dr. Durene Fruits and subsequently they were wanting to know if I could take over wound care for this patient. Apparently they do not have the ability for an office debridement which again I completely understand but nonetheless definitely is not an integral part of his healing along with the hyperbarics. Subsequently I discussed with the patient today that I do believe he would benefit going ahead and proceeding with evaluation here in the clinic for that reason I did go ahead and see him today to get things started. There is also been some confusion  about the issues with his home health nurse and getting measurements to [facility] for the wound VAC in order to continue his wound VAC therapy. With that being said after I see him today we will make a determination on what to do with wound VAC we can definitely send measurements to [facility] but again the bigger question is whether this is the best way to go or not. 11/28/2021 upon evaluation today patient appears to be doing decently well in regard to his wound. He has been tolerating the dressing changes with the Dakin's moistened gauze dressing which I think is doing a good job. Fortunately I do not see any signs of active infection locally nor systemically at this point. In general I think that he is actually making excellent progress which is great news. 2/17; seen in conjunction with HBO today. 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 Much improved condition of the wound surface. On the lateral foot and eschared area that he has been using Betadine. Tolerating HBO well 12/13/2021 upon evaluation today patient appears to be doing pretty well in regard to his foot ulcer. I am actually much more pleased currently that I been with the way the appearance looks today. The wound bed does show some signs of slough and biofilm on the surface of the wound this is minimal I Minna perform debridement the I will do so very carefully due to the fact that to be honest the patient had a significant amount of bleeding in the past being on the Brilinta he had a difficult time clotting when I debrided him previously quite significantly. Fortunately I do not see any signs of active infection at this time which is great news. No fevers, chills, nausea, vomiting, or diarrhea. 12/20/2021 upon evaluation today patient actually appears  to be showing some signs of excellent improvement here. I am very pleased with where things stand currently. There does not appear to be any evidence of active  infection locally nor systemically which is great news and I do believe that with the hyperbaric oxygen therapy he is actually making excellent progress. 3/9; patient presents for follow-up. He continues to do HBO therapy without issues. He has completed 34 out of 40 sessions. He continues to use Dakin's wet-to-dry to the amputation site and Betadine to the lateral foot wound. he currently denies signs of infection. Objective Constitutional Vitals Time Taken: 10:46 AM, Height: 70 in, Weight: 180.4 lbs, BMI: 25.9, Temperature: 98.6 F, Pulse: 72 bpm, Respiratory Rate: 18 Somoza, Vedanth L. ([id number]) breaths/min, Blood Pressure: 138/78 mmHg. General Notes: Left foot: To the amputation site there is dehiscence and an open wound with granulation tissue and nonviable tissue present. No surrounding signs of infection. To the lateral aspect there is an eschared area upon no surrounding signs of infection. Integumentary (Hair, Skin) Wound #1 status is Open. Original cause of wound was Surgical Injury. The date acquired was: 09/09/2021. The wound has been in treatment 4 weeks. 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 The wound measures 3.4cm length x 2cm width x 0.7cm depth; 5.341cm^2 area and 3.738cm^3 volume. There is muscle, tendon, Fat Layer (Subcutaneous Tissue), and fascia exposed. There is no tunneling or undermining noted. There is a medium amount of serosanguineous drainage noted. There is medium (34-66%) red granulation within the wound bed. There is a medium (34-66%) amount of necrotic tissue within the wound bed including Adherent Slough. Wound #2 status is Open. Original cause of wound was Blister. The date acquired was: 10/28/2021. The wound has been in treatment 4 weeks. The wound is located on the Left,Lateral Foot. The wound measures 2.2cm length x 1.7cm width x 0.1cm depth; 2.937cm^2 area and 0.294cm^3 volume. There is no tunneling or undermining noted. There is a none  present amount of drainage noted. There is no granulation within the wound bed. There is a large (67-100%) amount of necrotic tissue within the wound bed including Eschar. 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 There is improvement in appearance and size to the amputation site. I debrided nonviable tissue. No surrounding signs of infection. I recommended continuing Dakin's wet-to-dry dressings to this area and Betadine to the lateral eschared wound. He continues to do well with hyperbaric oxygen therapy. We will continue this. Procedures Wound #1 Pre-procedure diagnosis of Wound #1 is an Open Surgical Wound located on the Left Amputation Site - Digit . There was a Excisional Skin/Subcutaneous Tissue Debridement with a total area of 6.8 sq cm performed by Kalman Shan, MD. With the following instrument(s): Curette to remove Viable and Non-Viable tissue/material. Material removed includes Subcutaneous Tissue and Slough and. No specimens were taken. A time out was conducted at 11:01, prior to the start of the procedure. A Moderate amount of bleeding was controlled with Surgicel. The procedure was tolerated well. Post Debridement Measurements: 3.4cm length x 2cm width x 0.7cm depth; 3.738cm^3 volume. Character of Wound/Ulcer Post Debridement is stable. Post procedure Diagnosis Wound #1: Same as Pre-Procedure Plan 1. In office sharp  debridement 2. Dakin's wet-to-dry 3. Betadine 4. Follow-up in 1 week 5. Continue HBO therapy Electronic Signature(s) Signed: 12/26/2021 11:11:22 AM By: Kalman Shan DO Entered By:  Kalman Shan on 12/26/2021 11:10:23 Prichett, [name] L. ([id number]) Mormile, [place] ([id number]) -------------------------------------------------------------------------------- SuperBill Details Patient Name: Liz, Daequan L. Date of Service: 12/26/2021 Medical Record Number: [id number] Patient Account Number: [id number] Date of Birth/Sex: [birth year] (56 y.o. M) Treating RN: Levora Dredge Primary Care Provider: [name] Other Clinician: Referring Provider: [name] Treating Provider/Extender: Yaakov Guthrie in Treatment: 8 Diagnosis Coding ICD-10 Codes Code Description [phone] Type 2 diabetes mellitus with foot ulcer 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e M86.672 Other chronic osteomyelitis, left ankle and foot I10 Essential (primary) hypertension I25.10 Atherosclerotic heart disease of native coronary artery without angina pectoris I50.42 Chronic combined systolic (congestive) and diastolic (congestive) heart failure Facility Procedures CPT4 Code: 54008676 Description: 19509 - DEB SUBQ TISSUE 20 SQ CM/< Modifier: Quantity: 1 CPT4 Code: Description: ICD-10 Diagnosis Description L97.524 Non-pressure chronic ulcer of other part of left foot with necrosis of E11.621 Type 2 diabetes mellitus with foot ulcer M86.672 Other chronic osteomyelitis, left ankle and foot I25.10 Atherosclerotic  heart disease of native coronary artery without angina Modifier: bone pectoris Quantity: Physician Procedures CPT4 Code: 3267124 Description: 58099 - WC PHYS SUBQ TISS 20 SQ CM Modifier: Quantity: 1 CPT4 Code: Description: ICD-10 Diagnosis Description L97.524 Non-pressure chronic ulcer of other part of left foot with necrosis of E11.621 Type 2 diabetes mellitus with foot ulcer M86.672 Other chronic osteomyelitis, left ankle and foot I25.10 Atherosclerotic  heart disease of native coronary artery without angina Modifier: bone  pectoris Quantity: Electronic Signature(s) Signed: 12/26/2021 11:46:23 AM By: Levora Dredge Signed: 12/26/2021 12:52:37 PM By: Kalman Shan DO Previous Signature: 12/26/2021 11:11:22 AM Version By: Kalman Shan DO Entered By: Levora Dredge on 12/26/2021 11:46:22

## 2021-12-27 ENCOUNTER — Ambulatory Visit: Payer: Medicare Other | Admitting: Internal Medicine

## 2021-12-27 ENCOUNTER — Other Ambulatory Visit: Payer: Self-pay

## 2021-12-27 ENCOUNTER — Encounter: Payer: Medicare Other | Admitting: Internal Medicine

## 2021-12-27 ENCOUNTER — Encounter: Payer: Medicare Other | Admitting: Physician Assistant

## 2021-12-27 DIAGNOSIS — E11621 Type 2 diabetes mellitus with foot ulcer: Secondary | ICD-10-CM | POA: Diagnosis not present

## 2021-12-27 DIAGNOSIS — I2510 Atherosclerotic heart disease of native coronary artery without angina pectoris: Secondary | ICD-10-CM | POA: Diagnosis not present

## 2021-12-27 DIAGNOSIS — M86672 Other chronic osteomyelitis, left ankle and foot: Secondary | ICD-10-CM | POA: Diagnosis not present

## 2021-12-27 DIAGNOSIS — Z992 Dependence on renal dialysis: Secondary | ICD-10-CM | POA: Diagnosis not present

## 2021-12-27 DIAGNOSIS — D631 Anemia in chronic kidney disease: Secondary | ICD-10-CM | POA: Diagnosis not present

## 2021-12-27 DIAGNOSIS — I110 Hypertensive heart disease with heart failure: Secondary | ICD-10-CM | POA: Diagnosis not present

## 2021-12-27 DIAGNOSIS — I5042 Chronic combined systolic (congestive) and diastolic (congestive) heart failure: Secondary | ICD-10-CM | POA: Diagnosis not present

## 2021-12-27 DIAGNOSIS — N2581 Secondary hyperparathyroidism of renal origin: Secondary | ICD-10-CM | POA: Diagnosis not present

## 2021-12-27 DIAGNOSIS — N186 End stage renal disease: Secondary | ICD-10-CM | POA: Diagnosis not present

## 2021-12-27 DIAGNOSIS — L97524 Non-pressure chronic ulcer of other part of left foot with necrosis of bone: Secondary | ICD-10-CM | POA: Diagnosis not present

## 2021-12-27 LAB — GLUCOSE, CAPILLARY
Glucose-Capillary: 184 mg/dL — ABNORMAL HIGH (ref 70–99)
Glucose-Capillary: 137 mg/dL — ABNORMAL HIGH (ref 70–99)

## 2021-12-27 NOTE — Progress Notes (Signed)
Abbasi, [place] ([id number]) ?Visit Report for 12/27/2021 ?Arrival Information Details ?Patient Name: [name], [name] ?Date of Service: 12/27/2021 10:30 AM ?Medical Record Number: [id number] ?Patient Account Number: [id number] ?Date of Birth/Sex: 1966/09/09 (56 y.o. M) ?Treating RN: Donnamarie Poag ?Primary Care Gladis Soley: [name] ?Other Clinician: Jacqulyn Bath ?Referring Levorn Oleski: [name] ?Treating Tyla Burgner/Extender: Ricard Dillon ?Weeks in Treatment: 8 ?Visit Information History Since Last Visit ?Added or deleted any medications: No ?Patient Arrived: Stretcher ?Any new allergies or adverse reactions: No ?Arrival Time: 10:32 ?Had a fall or experienced change in No ?Accompanied By: self ?activities of daily living that may affect ?Transfer Assistance: None ?risk of falls: ?Patient Identification Verified: Yes ?Signs or symptoms of abuse/neglect since last visito No ?Secondary Verification Process Completed: Yes ?Hospitalized since last visit: No ?Patient Requires Transmission-Based No ?Implantable device outside of the clinic excluding No ?Precautions: ?cellular tissue based products placed in the center ?Patient Has Alerts: Yes ?since last visit: ?Patient Alerts: Patient on Blood ?Has Dressing in Place as Prescribed: Yes ?Thinner ?Has Footwear/Offloading in Place as Prescribed: Yes ?DIALYSIS RIGHT ARM ?Left: Surgical Shoe with Pressure ?Aspirin 81/Brilinta ?Relief Insole ?Pain Present Now: No ?Electronic Signature(s) ?Signed: 12/27/2021 1:32:14 PM By: Enedina Finner RCP, RRT, CHT ?Entered By: Enedina Finner on 12/27/2021 10:39:30 ?Sales, [place] ([id number]) ?-------------------------------------------------------------------------------- ?Encounter Discharge Information Details ?Patient Name: [name], [name] ?Date of Service: 12/27/2021 10:30 AM ?Medical Record Number: [id number] ?Patient Account Number: [id number] ?Date of Birth/Sex: September 27, 1966 (56 y.o. M) ?Treating RN: Donnamarie Poag ?Primary  Care Ozil Stettler: [name] ?Other Clinician: Jacqulyn Bath ?Referring Gedeon Brandow: [name] ?Treating Kelicia Youtz/Extender: Ricard Dillon ?Weeks in Treatment: 8 ?Encounter Discharge Information Items ?Discharge Condition: Stable ?Ambulatory Status: Ambulatory ?Discharge Destination: Home ?Transportation: Private Auto ?Accompanied By: self ?Schedule Follow-up Appointment: Yes ?Clinical Summary of Care: ?Notes ?Patient has an HBO treatment scheduled on 12/30/21 at 10:30 am. ?Electronic Signature(s) ?Signed: 12/27/2021 1:32:14 PM By: Enedina Finner RCP, RRT, CHT ?Entered By: Enedina Finner on 12/27/2021 13:30:40 ?Jenny, [place] ([id number]) ?-------------------------------------------------------------------------------- ?Vitals Details ?Patient Name: [name], [name] ?Date of Service: 12/27/2021 10:30 AM ?Medical Record Number: [id number] ?Patient Account Number: [id number] ?Date of Birth/Sex: [birth year] (56 y.o. M) ?Treating RN: Donnamarie Poag ?Primary Care Myesha Stillion: [name] ?Other Clinician: Jacqulyn Bath ?Referring Olando Willems: [name] ?Treating Nesta Kimple/Extender: Ricard Dillon ?Weeks in Treatment: 8 ?Vital Signs ?Time Taken: 10:34 ?Temperature (??F): 99.0 ?Height (in): 70 ?Pulse (bpm): 72 ?Weight (lbs): 180.4 ?Respiratory Rate (breaths/min): 16 ?Body Mass Index (BMI): 25.9 ?Blood Pressure (mmHg): 140/72 ?Capillary Blood Glucose (mg/dl): 184 ?Reference Range: 80 - 120 mg / dl ?Electronic Signature(s) ?Signed: 12/27/2021 1:32:14 PM By: Enedina Finner RCP, RRT, CHT ?Entered By: Enedina Finner on 12/27/2021 10:40:09 ?

## 2021-12-30 ENCOUNTER — Other Ambulatory Visit: Payer: Self-pay

## 2021-12-30 ENCOUNTER — Emergency Department
Admission: EM | Admit: 2021-12-30 | Discharge: 2021-12-30 | Disposition: A | Discharge status: Home / Self Care | Payer: Medicare Other | Attending: Emergency Medicine | Admitting: Emergency Medicine

## 2021-12-30 ENCOUNTER — Encounter (HOSPITAL_BASED_OUTPATIENT_CLINIC_OR_DEPARTMENT_OTHER): Payer: Medicare Other | Admitting: Internal Medicine

## 2021-12-30 DIAGNOSIS — K4030 Unilateral inguinal hernia, with obstruction, without gangrene, not specified as recurrent: Secondary | ICD-10-CM | POA: Diagnosis not present

## 2021-12-30 DIAGNOSIS — L97524 Non-pressure chronic ulcer of other part of left foot with necrosis of bone: Secondary | ICD-10-CM

## 2021-12-30 DIAGNOSIS — M86672 Other chronic osteomyelitis, left ankle and foot: Secondary | ICD-10-CM

## 2021-12-30 DIAGNOSIS — Z992 Dependence on renal dialysis: Secondary | ICD-10-CM | POA: Insufficient documentation

## 2021-12-30 DIAGNOSIS — K4091 Unilateral inguinal hernia, without obstruction or gangrene, recurrent: Secondary | ICD-10-CM

## 2021-12-30 DIAGNOSIS — I5042 Chronic combined systolic (congestive) and diastolic (congestive) heart failure: Secondary | ICD-10-CM | POA: Diagnosis not present

## 2021-12-30 DIAGNOSIS — D631 Anemia in chronic kidney disease: Secondary | ICD-10-CM | POA: Diagnosis not present

## 2021-12-30 DIAGNOSIS — E11621 Type 2 diabetes mellitus with foot ulcer: Secondary | ICD-10-CM

## 2021-12-30 DIAGNOSIS — I2510 Atherosclerotic heart disease of native coronary artery without angina pectoris: Secondary | ICD-10-CM | POA: Diagnosis not present

## 2021-12-30 DIAGNOSIS — K4090 Unilateral inguinal hernia, without obstruction or gangrene, not specified as recurrent: Secondary | ICD-10-CM | POA: Diagnosis not present

## 2021-12-30 DIAGNOSIS — R1900 Intra-abdominal and pelvic swelling, mass and lump, unspecified site: Secondary | ICD-10-CM | POA: Diagnosis present

## 2021-12-30 DIAGNOSIS — R1084 Generalized abdominal pain: Secondary | ICD-10-CM | POA: Diagnosis not present

## 2021-12-30 DIAGNOSIS — I110 Hypertensive heart disease with heart failure: Secondary | ICD-10-CM | POA: Diagnosis not present

## 2021-12-30 DIAGNOSIS — N186 End stage renal disease: Secondary | ICD-10-CM | POA: Diagnosis not present

## 2021-12-30 DIAGNOSIS — N2581 Secondary hyperparathyroidism of renal origin: Secondary | ICD-10-CM | POA: Diagnosis not present

## 2021-12-30 LAB — GLUCOSE, CAPILLARY
Glucose-Capillary: 121 mg/dL — ABNORMAL HIGH (ref 70–99)
Glucose-Capillary: 178 mg/dL — ABNORMAL HIGH (ref 70–99)

## 2021-12-30 NOTE — ED Provider Notes (Addendum)
? ?  [facility] ?Provider Note ? ? ? Event Date/Time  ? First MD Initiated Contact with Patient 12/30/21 1340   ?  (approximate) ? ? ?History  ? ?Hernia ? ? ?HPI ? ?[name] is a 56 y.o. male with extensive past medical history as detailed in the chart who presents with complaints of right inguinal swelling.  Patient reports he has had a hernia in his right groin for quite some time.  He reports over last 24 hours it seems to be larger than typical and is more uncomfortable.  Denies fevers or chills.  Today he went for wound clinic and to dialysis and then came to the emergency department to have his hernia evaluated ?  ? ? ?Physical Exam  ? ?Triage Vital Signs: ?ED Triage Vitals  ?Enc Vitals Group  ?   BP 12/30/21 1327 120/69  ?   Pulse Rate 12/30/21 1327 70  ?   Resp 12/30/21 1327 16  ?   Temp 12/30/21 1327 98.6 ?F (37 ?C)  ?   Temp Source 12/30/21 1327 Oral  ?   SpO2 12/30/21 1327 95 %  ?   Weight 12/30/21 1329 79.5 kg (175 lb 4.3 oz)  ?   Height 12/30/21 1329 1.778 m (5\' 10" )  ?   Head Circumference --   ?   Peak Flow --   ?   Pain Score 12/30/21 1328 10  ?   Pain Loc --   ?   Pain Edu? --   ?   Excl. in [place]? --   ? ? ?Most recent vital signs: ?Vitals:  ? 12/30/21 1400 12/30/21 1449  ?BP: 120/78 120/80  ?Pulse: 67 70  ?Resp: 19 19  ?Temp:    ?SpO2: 98% 98%  ? ? ? ?General: Awake, no distress.  ?CV:  Good peripheral perfusion.  ?Resp:  Normal effort.  ?Abd:  No distention.  Right groin: Hernia noted, no erythema, not tense, partially reducible ?Other:   ? ? ?ED Results / Procedures / Treatments  ? ?Labs ?(all labs ordered are listed, but only abnormal results are displayed) ?Labs Reviewed - No data to display ? ? ?EKG ? ? ? ? ?RADIOLOGY ? ? ? ? ?PROCEDURES: ? ?Critical Care performed:  ? ?Procedures ? ? ?MEDICATIONS ORDERED IN ED: ?Medications - No data to display ? ? ?IMPRESSION / MDM / ASSESSMENT AND PLAN / ED COURSE  ?I reviewed the triage vital signs and the nursing  notes. ? ?Attempted to reduce hernia, was able to mostly reduce it however not entirely. ? ?I consulted Dr. Peyton Najjar of surgery who kindly came to the emergency department to evaluate the patient.  He was able to reduce the hernia.  He recommends outpatient cardiology follow-up for operative clearance given his medical history ? ?Medical records reviewed, patient is following with [facility] with wound VAC treatment for osteomyelitis of the left foot ? ?No further work-up necessary at this time for reduced hernia ? ? ? ? ? ?  ? ? ?FINAL CLINICAL IMPRESSION(S) / ED DIAGNOSES  ? ?Final diagnoses:  ?Unilateral recurrent inguinal hernia without obstruction or gangrene  ? ? ? ?Rx / DC Orders  ? ?ED Discharge Orders   ? ? None  ? ?  ? ? ? ?Note:  This document was prepared using Dragon voice recognition software and may include unintentional dictation errors. ?  ?Lavonia Drafts, MD ?12/30/21 1523 ? ?  ?Lavonia Drafts, MD ?12/30/21 1524 ? ?

## 2021-12-30 NOTE — Discharge Instructions (Addendum)
Our surgeon recommends clearance by cardiology for surgery to repair the hernia ?

## 2021-12-30 NOTE — Consult Note (Signed)
SURGICAL CONSULTATION NOTE   HISTORY OF PRESENT ILLNESS (HPI):  55 y.o. male presented to [facility] ED for evaluation of right inguinal hernia. Patient reports he started having an issue with his right inguinal hernia after dialysis today.  Patient endorses that he has noticed a right inguinal hernia for almost a year.  He can easily reduce the hernia but today after dialysis he has had reasonable to reduce her hernia.  He endorses that it was painful.  He denies abdominal distention nausea or vomiting.  There is no blood radiation.  Aggravating factor is applying pressure.  There is no alleviating factor.  No labs have been drawn today.  I personally evaluated a CT angio of the abdomen done on 10/17/2021.  Patient with bilateral inguinal hernia, fat-containing.  No images have been done during this ED encounter.  Surgery is consulted by Dr. Corky Downs in this context for evaluation and management of right get hernia.  PAST MEDICAL HISTORY (PMH):  Past Medical History:  Diagnosis Date   Acute osteomyelitis of right foot ([place]) 05/15/2019   Anemia 81/19/1478   Complication of anesthesia    Coronary artery disease    Diabetes with renal manifestations(250.4) 08/23/2013   Diabetic infection of right foot ([place])    Diabetic retinopathy associated with type 2 diabetes mellitus ([place]) 06/23/2019   ESRD on hemodialysis ([place]) 07/18/2011   Essential hypertension 10/18/2007   Qualifier: Diagnosis of  By: Loanne Drilling MD, Hilliard Clark A    History of blood transfusion    HYPERLIPIDEMIA 05/16/2008   Qualifier: Diagnosis of  By: Jenny Reichmann MD, Hunt Oris    MRSA INFECTION 10/18/2007   Qualifier: Diagnosis of  By: Marca Ancona RMA, Lucy     Other forms of retinal detachment(361.89) 06/23/2019   PONV (postoperative nausea and vomiting)    Secondary hyperparathyroidism ([place]) 06/23/2019   Sepsis ([place]) 05/14/2019     PAST SURGICAL HISTORY (Martha):  Past Surgical History:  Procedure Laterality Date   ABDOMINAL AORTOGRAM N/A 05/17/2019    Procedure: ABDOMINAL AORTOGRAM;  Surgeon: Elam Dutch, MD;  Location: [place] [facility];  Service: Cardiovascular;  Laterality: N/A;   AMPUTATION TOE Right 05/14/2019   Procedure: PARTIAL AMPUTATION SECOND TOE RIGHT FOOT;  Surgeon: Evelina Bucy, DPM;  Location: [employer];  Service: Podiatry;  Laterality: Right;   AMPUTATION TOE Right 05/23/2019   Procedure: AMPUTATION OF SECOND TOE METATARSAL PHALANGEAL JOINT;  Surgeon: Evelina Bucy, DPM;  Location: [place];  Service: Podiatry;  Laterality: Right;   AMPUTATION TOE Right 05/27/2019   Procedure: Amputation Toe;  Surgeon: Evelina Bucy, DPM;  Location: [place];  Service: Podiatry;  Laterality: Right;  right third toe   APPLICATION OF WOUND VAC Right 05/27/2019   Procedure: Application Of Wound Vac;  Surgeon: Evelina Bucy, DPM;  Location: [place];  Service: Podiatry;  Laterality: Right;   AV FISTULA PLACEMENT  06-18-11   Right brachiocephalic AVF   BONE BIOPSY Right 05/14/2019   Procedure: OPEN SUPERFICIAL BONE BIOPSY GREAT TOE;  Surgeon: Evelina Bucy, DPM;  Location: [place];  Service: Podiatry;  Laterality: Right;   CARDIAC CATHETERIZATION     CORONARY BALLOON ANGIOPLASTY N/A 09/24/2020   Procedure: CORONARY BALLOON ANGIOPLASTY;  Surgeon: Martinique, Peter M, MD;  Location: [facility];  Service: Cardiovascular;  Laterality: N/A;   CORONARY STENT INTERVENTION N/A 09/24/2020   Procedure: CORONARY STENT INTERVENTION;  Surgeon: Martinique, Peter M, MD;  Location: [place] [facility];  Service: Cardiovascular;  Laterality: N/A;  EYE SURGERY     left eye for Laser, diabetic retinopathy   FISTULOGRAM Right 11/06/2011   Procedure: FISTULOGRAM;  Surgeon: Conrad [place], MD;  Location: [facility] C[facility];  Service: Cardiovascular;  Laterality: Right;   HEMATOMA EVACUATION Right Feb. 25, 2014   HEMATOMA EVACUATION Right 12/14/2012   Procedure: EVACUATION HEMATOMA;  Surgeon: Conrad Stokes, MD;  Location: [place];  Service: Vascular;  Laterality: Right;   INCISION  AND DRAINAGE Right 05/23/2019   Procedure: INCISION AND DRAINAGE OF RIGHT FOOT DEEP SPACE ABSCESS;  Surgeon: Evelina Bucy, DPM;  Location: [place];  Service: Podiatry;  Laterality: Right;   INSERTION OF DIALYSIS CATHETER  12/14/2012   Procedure: INSERTION OF DIALYSIS CATHETER;  Surgeon: Conrad [place], MD;  Location: [place];  Service: Vascular;;   INSERTION OF DIALYSIS CATHETER Right 01/07/2021   Procedure: INSERTION OF DIALYSIS CATHETER;  Surgeon: Elam Dutch, MD;  Location: [place];  Service: Vascular;  Laterality: Right;   INSERTION OF DIALYSIS CATHETER Left 01/08/2021   Procedure: INSERTION OF LEFT INTERNAL JUGULAR DIALYSIS CATHETER, tunneled;  Surgeon: Angelia Mould, MD;  Location: [place];  Service: Vascular;  Laterality: Left;   INTRAVASCULAR PRESSURE WIRE/FFR STUDY N/A 09/24/2020   Procedure: INTRAVASCULAR PRESSURE WIRE/FFR STUDY;  Surgeon: Martinique, Peter M, MD;  Location: [place] [facility];  Service: Cardiovascular;  Laterality: N/A;   IRRIGATION AND DEBRIDEMENT FOOT Right 05/25/2019   Procedure: INCISION AND DRAINAGE FOOT;  Surgeon: Evelina Bucy, DPM;  Location: [place];  Service: Podiatry;  Laterality: Right;   IRRIGATION AND DEBRIDEMENT FOOT Right 05/27/2019   Procedure: IRRIGATION AND DEBRIDEMENT FOOT;  Surgeon: Evelina Bucy, DPM;  Location: [place];  Service: Podiatry;  Laterality: Right;   LEFT HEART CATH AND CORONARY ANGIOGRAPHY N/A 09/24/2020   Procedure: LEFT HEART CATH AND CORONARY ANGIOGRAPHY;  Surgeon: Martinique, Peter M, MD;  Location: [place] [facility];  Service: Cardiovascular;  Laterality: N/A;   LOWER EXTREMITY ANGIOGRAPHY Bilateral 05/17/2019   Procedure: LOWER EXTREMITY ANGIOGRAPHY;  Surgeon: Elam Dutch, MD;  Location: [place] [facility];  Service: Cardiovascular;  Laterality: Bilateral;   REMOVAL OF A DIALYSIS CATHETER Right 01/08/2021   Procedure: REMOVAL OF TEMPORARY DIALYSIS CATHETER, LEFT FEMORAL VEIN;  Surgeon: Angelia Mould, MD;  Location: [place] [facility];   Service: Vascular;  Laterality: Right;   REVISON OF ARTERIOVENOUS FISTULA Right 12/14/2012   Procedure: REVISON OF upper arm ARTERIOVENOUS FISTULA using 56mmx10cm gortex graft;  Surgeon: Conrad [place], MD;  Location: [place];  Service: Vascular;  Laterality: Right;   REVISON OF ARTERIOVENOUS FISTULA Right 12/27/2020   Procedure: RIGHT UPPER EXTREMITY ARTERIOVENOUS FISTULA REVISiON;  Surgeon: Cherre Robins, MD;  [facility];  Service: Vascular;  Laterality: Right;  PERIPHERAL NERVE BLOCK   RIGHT/LEFT HEART CATH AND CORONARY ANGIOGRAPHY N/A 08/07/2020   Procedure: RIGHT/LEFT HEART CATH AND CORONARY ANGIOGRAPHY;  Surgeon: Martinique, Peter M, MD;  Location: [place] [facility];  Service: Cardiovascular;  Laterality: N/A;   SOFT TISSUE MASS EXCISION     Right arm, Left leg  for MRSA infection     MEDICATIONS:  Prior to Admission medications   Medication Sig Start Date End Date Taking? Authorizing Provider  amLODipine (NORVASC) 10 MG tablet Take 10 mg by mouth at bedtime.  08/03/20   [provider]  aspirin EC 81 MG tablet Take 1 tablet (81 mg total) by mouth daily. 06/28/19   Wellington Hampshire, MD  Continuous Blood Gluc Sensor (  FREESTYLE LIBRE 14 DAY SENSOR) MISC See admin instructions. 06/20/19   [provider]  Doxercalciferol (HECTOROL IV) Doxercalciferol (Hectorol) 07/31/21 07/30/22  [provider]  ethyl chloride spray 1 application See admin instructions. As needed for pain on dialysis days 03/15/21   [provider]  Evolocumab (REPATHA SURECLICK) 268 MG/ML SOAJ Inject 140 mg into the skin every 14 (fourteen) days. 03/26/21   [provider]  hydrALAZINE (APRESOLINE) 50 MG tablet Take 50 mg by mouth 3 (three) times daily. 12/18/20   [provider]  isosorbide mononitrate (IMDUR) 30 MG 24 hr tablet Take 1 tablet (30 mg total) by mouth daily. 09/15/21 10/15/21  Darliss Cheney, MD  Methoxy PEG-Epoetin Beta (MIRCERA IJ) dialysis 05/06/21 05/05/22   [provider]  metoprolol succinate (TOPROL-XL) 100 MG 24 hr tablet Take 100 mg by mouth at bedtime. 08/06/21   [provider]  nitroGLYCERIN (NITROSTAT) 0.4 MG SL tablet Place 0.4 mg under the tongue every 5 (five) minutes as needed for chest pain. 08/24/21 08/24/22  [provider]  oxyCODONE (OXY IR/ROXICODONE) 5 MG immediate release tablet Take 5 mg by mouth 4 (four) times daily as needed. 09/10/21   [provider]  sevelamer carbonate (RENVELA) 800 MG tablet Take 1,600 mg by mouth 3 (three) times daily with meals. 07/25/20   [provider]  sodium zirconium cyclosilicate (LOKELMA) 10 g PACK packet Take 10 g by mouth See admin instructions. Non dialysis days(Tuesday,Thursday,Saturday and "Sunday)    [provider]  traZODone (DESYREL) 150 MG tablet Take 150 mg by mouth at bedtime. 07/15/21   [provider]  loratadine (CLARITIN) 10 MG tablet Take 10 mg by mouth daily as needed for allergies.  11/06/19  [provider]     ALLERGIES:  Allergies  Allergen Reactions   Atorvastatin     Other reaction(s): Muscle Pain   Rosuvastatin     Other reaction(s): Muscle Pain   Amoxicillin Itching    Did it involve swelling of the face/tongue/throat, SOB, or low BP? Unknown Did it involve sudden or severe rash/hives, skin peeling, or any reaction on the inside of your mouth or nose? Unknown Did you need to seek medical attention at a hospital or doctor's office? Unknown When did it last happen?      20"  years ago If all above answers are "NO", may proceed with cephalosporin use.    Chlorhexidine Hives    Patient has blistering   Hydromorphone Itching    Patient states he may take with benadryl. Makes feet itch.   Percocet [Oxycodone-Acetaminophen] Itching and Other (See Comments)    Legs only. Itching and burning feeling.   Morphine     Other reaction(s): rash/itching All narcotics per pt   Gabapentin Nausea And Vomiting     Stomach issues      SOCIAL HISTORY:  Social History   Socioeconomic History   Marital status: Married    Spouse name: Not on file   Number of children: 2   Years of education: 14   Highest education level: Not on file  Occupational History   Occupation: Disabled  Tobacco Use   Smoking status: Never   Smokeless tobacco: Never  Vaping Use   Vaping Use: Never used  Substance and Sexual Activity   Alcohol use: No   Drug use: No   Sexual activity: Yes  Other Topics Concern   Not on file  Social History Narrative   Not on file   Social Determinants  of Health   Financial Resource Strain: Not on file  Food Insecurity: Not on file  Transportation Needs: Not on file  Physical Activity: Not on file  Stress: Not on file  Social Connections: Not on file  Intimate Partner Violence: Not on file      FAMILY HISTORY:  Family History  Problem Relation Age of Onset   Diabetes Mother      REVIEW OF SYSTEMS:  Constitutional: denies weight loss, fever, chills, or sweats  Eyes: denies any other vision changes, history of eye injury  ENT: denies sore throat, hearing problems  Respiratory: denies shortness of breath, wheezing  Cardiovascular: denies chest pain, palpitations  Gastrointestinal: abdominal pain, nausea and vomiting Genitourinary: denies burning with urination or urinary frequency Musculoskeletal: denies any other joint pains or cramps  Skin: denies any other rashes or skin discolorations  Neurological: denies any other headache, dizziness, weakness  Psychiatric: denies any other depression, anxiety   All other review of systems were negative   VITAL SIGNS:  Temp:  [98.6 F (37 C)] 98.6 F (37 C) (03/13 1327) Pulse Rate:  [70] 70 (03/13 1327) Resp:  [16] 16 (03/13 1327) BP: (120)/(69) 120/69 (03/13 1327) SpO2:  [95 %] 95 % (03/13 1327) Weight:  [79.5 kg] 79.5 kg (03/13 1329)     Height: 5\' 10"  (177.8 cm) Weight: 79.5 kg BMI (Calculated): 25.15    INTAKE/OUTPUT:  This shift: No intake/output data recorded.  Last 2 shifts: @IOLAST2SHIFTS @   PHYSICAL EXAM:  Constitutional:  -- Normal body habitus  -- Awake, alert, and oriented x3  Eyes:  -- Pupils equally round and reactive to light  -- No scleral icterus  Ear, nose, and throat:  -- No jugular venous distension  Pulmonary:  -- No crackles  -- Equal breath sounds bilaterally -- Breathing non-labored at rest Cardiovascular:  -- S1, S2 present  -- No pericardial rubs Gastrointestinal:  -- Abdomen soft, nontender, non-distended, no guarding or rebound tenderness -- No abdominal masses appreciated, pulsatile or otherwise  --Incarcerated inguinal hernia, able to be reduced at bedside.  No skin changes. Musculoskeletal and Integumentary:  -- Wounds: None appreciated -- Extremities: B/L UE and LE FROM, hands and feet warm, no edema  --Left foot with chronic ulcer Neurologic:  -- Motor function: intact and symmetric -- Sensation: intact and symmetric   Labs:  CBC Latest Ref Rng & Units 09/15/2021 09/14/2021 09/13/2021  WBC 4.0 - 10.5 K/uL 5.3 4.6 6.9  Hemoglobin 13.0 - 17.0 g/dL 9.0(L) 8.8(L) 10.3(L)  Hematocrit 39.0 - 52.0 % 28.9(L) 27.2(L) 31.9(L)  Platelets 150 - 400 K/uL 303 294 345   CMP Latest Ref Rng & Units 09/15/2021 09/14/2021 09/13/2021  Glucose 70 - 99 mg/dL 156(H) 128(H) 116(H)  BUN 6 - 20 mg/dL 37(H) 66(H) 47(H)  Creatinine 0.61 - 1.24 mg/dL 8.32(H) 11.45(H) 9.27(H)  Sodium 135 - 145 mmol/L 136 134(L) 135  Potassium 3.5 - 5.1 mmol/L 4.3 5.2(H) 5.0  Chloride 98 - 111 mmol/L 96(L) 93(L) 92(L)  CO2 22 - 32 mmol/L 29 27 28   Calcium 8.9 - 10.3 mg/dL 8.9 9.0 9.2  Total Protein 6.5 - 8.1 g/dL - - -  Total Bilirubin 0.3 - 1.2 mg/dL - - -  Alkaline Phos 38 - 126 U/L - - -  AST 15 - 41 U/L - - -  ALT 0 - 44 U/L - - -    Imaging studies:  No pertinent images for this encounter  Assessment/Plan:  56 y.o. male with incarcerated right  middle hernia,  complicated by pertinent comorbidities including end-stage renal disease on hemodialysis, coronary artery disease s/p stent multiple times, dual antiplatelet therapy, hypertension.  Patient with an acute episode of right inguinal hernia incarceration.  I was consulted due to difficulty reducing the hernia.  I was able to reduce the hernia at bedside.  The right groin is soft without any sign of strangulation.  Due to patient significant chronic medical conditions such as coronary artery disease status post stent being evaluated for coronary artery bypass, end-stage renal disease on hemodialysis, dual antiplatelet therapy patient is not a good candidate for surgical management in the acute setting.  Since the hernia was able to be reduced I recommend the patient to be reevaluated by his cardiologist at [facility] for medical clearance.  Patient receives most of his care at [facility].  I recommend the patient to get elective surgical management where most of his care team are available.  At this moment there is no indication for surgical management.  Patient can be discharged from surgical standpoint.  Arnold Long, MD

## 2021-12-30 NOTE — ED Notes (Signed)
D/C and reasons to return to ED discussed with pt and wife, both verbalized understanding. NAD noted. VSS on D/C.  ?

## 2021-12-30 NOTE — Progress Notes (Signed)
[place], [place] ([id number]) ?Visit Report for 12/30/2021 ?HBO Details ?Patient Name: [name], [name] ?Date of Service: 12/30/2021 10:30 AM ?Medical Record Number: [id number] ?Patient Account Number: [id number] ?Date of Birth/Sex: [birth year] (56 y.o. 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 ?HBO Treatment Details ?Treatment Number: 77 ?Patient Type: Outpatient ?Chamber Type: Monoplace ?Chamber Serial #: [id number] ?Treatment Protocol: 2.0 ATA with 90 minutes oxygen, and no air breaks ?Treatment Details ?Compression Rate Down: 1.0 psi / minute ?De-Compression Rate Up: 1.5 psi / minute ?Compress Tx Pressure Air breaks and breathing periods Decompress Decompress ?Begins Reached (leave unused spaces blank) Begins Ends ?Chamber Pressure (ATA) 1 2 - - - - - - 2 1 ?Clock Time (24 hr) 10:43 11:01 - - - - - - 12:32 12:43 ?Treatment Length: 120 (minutes) ?Treatment Segments: 4 ?Vital Signs ?Capillary Blood Glucose Reference Range: 80 - 120 mg / dl ?HBO Diabetic Blood Glucose Intervention Range: <131 mg/dl or >249 mg/dl ?Time Vitals Blood Respiratory Capillary Blood Glucose Pulse Action ?Type: ?Pulse: Temperature: ?Taken: ?Pressure: ?Rate: ?Glucose (mg/dl): ?Meter #: Oximetry (%) Taken: ?Pre 10:25 132/70 72 16 98.1 121 1 none per protocol ?Post 12:52 124/66 66 16 99.1 178 1 none per protocol ?Treatment Response ?Treatment Toleration: Well ?Treatment Completion ?Treatment Completed without Adverse Event ?Status: ?Treatment Notes ?Patient's wife stopped by the [name] on her way to the [facility] ED to pick up his belongings. 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 When he was placed on the stretcher to go into HBO chamber he started ?experiencing worsening pain that did not improve. He has a hernia to this area that has been stable for the past year. On exam he had a ?noticeable hernia with pain on palpation. I recommended at this time to go to the emergency department for further imaging and evaluation for ?potential incarcerated hernia. He drove to his appointment however states he cannot move without extreme pain to the area. EMS was called. He ?was transported. ?Electronic Signature(s) ?Signed: 12/30/2021 2:27:44 PM By: Enedina Finner RCP, RRT, CHT ?Signed: 12/30/2021 2:33:16 PM By: Kalman Shan DO ?Entered By: Enedina Finner on 12/30/2021 14:25:24 ?[name], [name] ([id number]) ?-------------------------------------------------------------------------------- ?HBO Safety Checklist Details ?Patient Name: [name], [name] ?Date of Service: 12/30/2021 10:30 AM ?Medical Record Number: [id number] ?Patient Account Number: [id number] ?Date of Birth/Sex: July 28, 1966 (56 y.o. M) ?Treating RN: Donnamarie Poag ?Primary Care Akari Defelice: [name] ?Other Clinician: Jacqulyn Bath ?Referring Mailey Landstrom: [name] ?Treating Aundreya Souffrant/Extender: Kalman Shan ?Weeks in Treatment: 8 ?HBO Safety Checklist Items ?Safety Checklist ?Consent Form Signed ?Patient voided / foley secured and emptied ?When did you last eato 10:00 am ?Last dose of injectable or oral agent n/a ?Ostomy pouch emptied and vented if applicable ?NA ?All implantable devices assessed, documented and approved ?NA ?Intravenous access site secured and place ?NA ?Valuables secured ?Linens and cotton and cotton/polyester blend (less than 51% polyester) ?Personal oil-based products / skin lotions / body lotions removed ?Wigs or hairpieces removed ?NA ?Smoking or tobacco materials removed ?NA ?Books / newspapers / magazines / loose paper  removed ?NA ?Cologne, aftershave, perfume and deodorant removed ?Jewelry removed (may wrap wedding band) ?Make-up removed ?NA ?Hair care products removed ?  Battery operated devices (external) removed ?NA ?Heating patches and chemical warmers removed ?NA ?Titanium eyewear removed ?NA ?Nail polish cured greater than 10 hours ?NA ?Casting material cured greater than 10 hours ?NA ?Hearing aids removed ?NA ?Loose dentures or partials removed ?NA ?Prosthetics have been removed ?NA ?Patient demonstrates correct use of air break device (if applicable) ?Patient concerns have been addressed ?Patient grounding bracelet on and cord attached to chamber ?Specifics for Inpatients (complete in addition to ?above) ?Medication sheet sent with patient ?Intravenous medications needed or due during therapy sent with patient ?Drainage tubes (e.g. nasogastric tube or chest tube secured and vented) ?Endotracheal or Tracheotomy tube secured ?Cuff deflated of air and inflated with saline ?Airway suctioned ?Electronic Signature(s) ?Signed: 12/30/2021 2:27:44 PM By: Enedina Finner RCP, RRT, CHT ?Entered By: Enedina Finner on 12/30/2021 12:10:04 ?

## 2021-12-30 NOTE — Progress Notes (Signed)
[place], [place] ([id number]) ?Visit Report for 12/30/2021 ?Arrival Information Details ?Patient Name: [name], [name] ?Date of Service: 12/30/2021 10:30 AM ?Medical Record Number: [id number] ?Patient Account Number: [id number] ?Date of Birth/Sex: [birth year] (56 y.o. M) ?Treating RN: Donnamarie Poag ?Primary Care Lisia Westbay: [name] ?Other Clinician: Jacqulyn Bath ?Referring Chia Rock: [name] ?Treating Donte Lenzo/Extender: Kalman Shan ?Weeks in Treatment: 8 ?Visit Information History Since Last Visit ?Added or deleted any medications: No ?Patient Arrived: Ambulatory ?Any new allergies or adverse reactions: No ?Arrival Time: 10:25 ?Had a fall or experienced change in No ?Accompanied By: self ?activities of daily living that may affect ?Transfer Assistance: None ?risk of falls: ?Patient Identification Verified: Yes ?Signs or symptoms of abuse/neglect since last visito No ?Secondary Verification Process Completed: Yes ?Hospitalized since last visit: No ?Patient Requires Transmission-Based No ?Implantable device outside of the clinic excluding No ?Precautions: ?cellular tissue based products placed in the center ?Patient Has Alerts: Yes ?since last visit: ?Patient Alerts: Patient on Blood ?Has Dressing in Place as Prescribed: Yes ?Thinner ?Has Footwear/Offloading in Place as Prescribed: Yes ?DIALYSIS RIGHT ARM ?Left: Surgical Shoe with Pressure ?Aspirin 81/Brilinta ?Relief Insole ?Pain Present Now: No ?Electronic Signature(s) ?Signed: 12/30/2021 2:27:44 PM By: Enedina Finner RCP, RRT, CHT ?Entered By: Enedina Finner on 12/30/2021 12:08:10 ?[name], [name] ([id number]) ?-------------------------------------------------------------------------------- ?Encounter Discharge Information Details ?Patient Name: [name], [name] ?Date of Service: 12/30/2021 10:30 AM ?Medical Record Number: [id number] ?Patient Account Number: [id number] ?Date of Birth/Sex: Jul 03, 1966 (56 y.o. M) ?Treating RN: Donnamarie Poag ?Primary  Care Yuuki Skeens: [name] ?Other Clinician: Jacqulyn Bath ?Referring Latisa Belay: [name] ?Treating Jamien Casanova/Extender: Kalman Shan ?Weeks in Treatment: 8 ?Encounter Discharge Information Items ?Discharge Condition: Stable ?Ambulatory Status: Ambulatory ?Discharge Destination: Emergency Room ?Telephoned: No ?Orders Sent: No ?Transportation: Ambulance ?Accompanied By: EMS ?Schedule Follow-up Appointment: Yes ?Clinical Summary of Care: ?Notes ?Patient is scheduled for an HBO appointment on 12/31/21 at 08:00 am. ?Electronic Signature(s) ?Signed: 12/30/2021 2:27:44 PM By: Enedina Finner RCP, RRT, CHT ?Entered By: Enedina Finner on 12/30/2021 14:27:07 ?[name], [name] ([id number]) ?-------------------------------------------------------------------------------- ?Vitals Details ?Patient Name: [name], [name] ?Date of Service: 12/30/2021 10:30 AM ?Medical Record Number: [id number] ?Patient Account Number: [id number] ?Date of Birth/Sex: 1966/10/17 (56 y.o. M) ?Treating RN: Donnamarie Poag ?Primary Care Velencia Lenart: [name] ?Other Clinician: Jacqulyn Bath ?Referring Malacai Grantz: [name] ?Treating Madalyne Husk/Extender: Kalman Shan ?Weeks in Treatment: 8 ?Vital Signs ?Time Taken: 10:25 ?Temperature (??F): 98.1 ?Height (in): 70 ?Pulse (bpm): 72 ?Weight (lbs): 180.4 ?Respiratory Rate (breaths/min): 16 ?Body Mass Index (BMI): 25.9 ?Blood Pressure (mmHg): 132/70 ?Capillary Blood Glucose (mg/dl): 121 ?Reference Range: 80 - 120 mg / dl ?Electronic Signature(s) ?Signed: 12/30/2021 2:27:44 PM By: Enedina Finner RCP, RRT, CHT ?Entered By: Enedina Finner on 12/30/2021 12:08:44 ?

## 2021-12-30 NOTE — Progress Notes (Signed)
Lockner, [place] ([id number]) ?Visit Report for 12/27/2021 ?HBO Details ?Patient Name: [name], [name] ?Date of Service: 12/27/2021 10:30 AM ?Medical Record Number: [id number] ?Patient Account Number: [id number] ?Date of Birth/Sex: 1966/05/29 (56 y.o. M) ?Treating RN: Donnamarie Poag ?Primary Care Nizar Cutler: [name] ?Other Clinician: Jacqulyn Bath ?Referring Haniyah Maciolek: [name] ?Treating Devlon Dosher/Extender: Ricard Dillon ?Weeks in Treatment: 8 ?HBO Treatment Course Details ?Treatment Course Number: 1 ?Ordering Christyne Mccain: Jeri Cos ?Total Treatments Ordered: 40 ?HBO Treatment Start Date: 11/04/2021 ?HBO Indication: ?Chronic Refractory Osteomyelitis to Left Foot ?HBO Treatment Details ?Treatment Number: 35 ?Patient Type: Outpatient ?Chamber Type: Monoplace ?Chamber Serial #: [id number] ?Treatment Protocol: 2.0 ATA with 90 minutes oxygen, and no air breaks ?Treatment Details ?Compression Rate Down: 1.0 psi / minute ?De-Compression Rate Up: 1.5 psi / minute ?Compress Tx Pressure Air breaks and breathing periods Decompress Decompress ?Begins Reached (leave unused spaces blank) Begins Ends ?Chamber Pressure (ATA) 1 2 - - - - - - 2 1 ?Clock Time (24 hr) 10:48 11:05 - - - - - - 12:35 12:45 ?Treatment Length: 117 (minutes) ?Treatment Segments: 4 ?Vital Signs ?Capillary Blood Glucose Reference Range: 80 - 120 mg / dl ?HBO Diabetic Blood Glucose Intervention Range: <131 mg/dl or >249 mg/dl ?Time Vitals Blood Respiratory Capillary Blood Glucose Pulse Action ?Type: ?Pulse: Temperature: ?Taken: ?Pressure: ?Rate: ?Glucose (mg/dl): ?Meter #: Oximetry (%) Taken: ?Pre 10:34 140/72 72 16 99 184 1 none per protocol ?Post 12:48 138/74 72 16 99.2 137 1 none per protocol ?Treatment Response ?Treatment Toleration: Well ?Treatment Completion ?Treatment Completed without Adverse Event ?Status: ?Brighid Koch Notes ?No concerns with treatment given ?HBO Attestation ?I certify that I supervised this HBO treatment in accordance with ?Medicare  guidelines. A trained emergency response team is readily Yes ?available per hospital policies and procedures. ?Continue HBOT as ordered. Yes ?Electronic Signature(s) ?Signed: 12/27/2021 1:32:14 PM By: Enedina Finner RCP, RRT, CHT ?Signed: 12/30/2021 6:05:03 PM By: Linton Ham MD ?Previous Signature: 12/27/2021 1:04:01 PM Version By: Linton Ham MD ?Entered By: Enedina Finner on 12/27/2021 13:29:39 ?[place], [place] ([id number]) ?-------------------------------------------------------------------------------- ?HBO Safety Checklist Details ?Patient Name: [name], [name] ?Date of Service: 12/27/2021 10:30 AM ?Medical Record Number: [id number] ?Patient Account Number: [id number] ?Date of Birth/Sex: 1966/05/31 (56 y.o. M) ?Treating RN: Donnamarie Poag ?Primary Care Wilbon Obenchain: [name] ?Other Clinician: Jacqulyn Bath ?Referring Yenty Bloch: [name] ?Treating Hailey Stormer/Extender: Ricard Dillon ?Weeks in Treatment: 8 ?HBO Safety Checklist Items ?Safety Checklist ?Consent Form Signed ?Patient voided / foley secured and emptied ?When did you last eato 10:00 am ?Last dose of injectable or oral agent n/a ?Ostomy pouch emptied and vented if applicable ?NA ?All implantable devices assessed, documented and approved ?NA ?Intravenous access site secured and place ?NA ?Valuables secured ?NA ?Linens and cotton and cotton/polyester blend (less than 51% polyester) ?Personal oil-based products / skin lotions / body lotions removed ?Wigs or hairpieces removed ?NA ?Smoking or tobacco materials removed ?NA ?Books / newspapers / magazines / loose paper removed ?NA ?Cologne, aftershave, perfume and deodorant removed ?Jewelry removed (may wrap wedding band) ?Make-up removed ?NA ?Hair care products removed ?Battery operated devices (external) removed ?NA ?Heating patches and chemical warmers removed ?NA ?Titanium eyewear removed ?NA ?Nail polish cured greater than 10 hours ?NA ?Casting material cured greater than 10  hours ?NA ?Hearing aids removed ?NA ?Loose dentures or partials removed ?NA ?Prosthetics have been removed ?NA ?Patient demonstrates correct use of air break device (if applicable) ?Patient concerns have been addressed ?Patient grounding bracelet on and cord attached to chamber ?  Specifics for Inpatients (complete in addition to ?above) ?Medication sheet sent with patient ?Intravenous medications needed or due during therapy sent with patient ?Drainage tubes (e.g. nasogastric tube or chest tube secured and vented) ?Endotracheal or Tracheotomy tube secured ?Cuff deflated of air and inflated with saline ?Airway suctioned ?Electronic Signature(s) ?Signed: 12/27/2021 1:32:14 PM By: Enedina Finner RCP, RRT, CHT ?Entered By: Enedina Finner on 12/27/2021 10:41:22 ?

## 2021-12-30 NOTE — ED Notes (Signed)
Pt presents to ED with c/o of R hernia pain, pt states this has been ongoing for the past 2-3 years. Pt states today his R groin area is more swollen and is not able to reduce it himself at this time.  ? ?Pt states currently getting hyperbaric treatment for  L foot/toe infection but states he is not here for this and that it is "healing great". Pt is also a dialysis pt and had a full TX today with no issues. Pt denies fevers or chills and states he does not really make urine at this time due to CKD.  ?

## 2021-12-30 NOTE — ED Notes (Signed)
This RN attempted PIV access x1 with no success, pt is a dialysis pt with limited access other than to L arm. Pt requests US guided to minimize the amount of sticks he obtains, MD aware, IV team consult order placed.  ?

## 2021-12-31 ENCOUNTER — Encounter: Payer: Medicare Other | Admitting: Internal Medicine

## 2021-12-31 DIAGNOSIS — M86172 Other acute osteomyelitis, left ankle and foot: Secondary | ICD-10-CM | POA: Diagnosis not present

## 2021-12-31 DIAGNOSIS — B952 Enterococcus as the cause of diseases classified elsewhere: Secondary | ICD-10-CM | POA: Diagnosis not present

## 2021-12-31 DIAGNOSIS — Z4781 Encounter for orthopedic aftercare following surgical amputation: Secondary | ICD-10-CM | POA: Diagnosis not present

## 2021-12-31 DIAGNOSIS — E1151 Type 2 diabetes mellitus with diabetic peripheral angiopathy without gangrene: Secondary | ICD-10-CM | POA: Diagnosis not present

## 2021-12-31 DIAGNOSIS — B957 Other staphylococcus as the cause of diseases classified elsewhere: Secondary | ICD-10-CM | POA: Diagnosis not present

## 2021-12-31 DIAGNOSIS — L03116 Cellulitis of left lower limb: Secondary | ICD-10-CM | POA: Diagnosis not present

## 2022-01-01 ENCOUNTER — Other Ambulatory Visit: Payer: Self-pay

## 2022-01-01 ENCOUNTER — Encounter (HOSPITAL_BASED_OUTPATIENT_CLINIC_OR_DEPARTMENT_OTHER): Payer: Medicare Other | Admitting: Internal Medicine

## 2022-01-01 DIAGNOSIS — I2510 Atherosclerotic heart disease of native coronary artery without angina pectoris: Secondary | ICD-10-CM | POA: Diagnosis not present

## 2022-01-01 DIAGNOSIS — L97524 Non-pressure chronic ulcer of other part of left foot with necrosis of bone: Secondary | ICD-10-CM

## 2022-01-01 DIAGNOSIS — D631 Anemia in chronic kidney disease: Secondary | ICD-10-CM | POA: Diagnosis not present

## 2022-01-01 DIAGNOSIS — I110 Hypertensive heart disease with heart failure: Secondary | ICD-10-CM | POA: Diagnosis not present

## 2022-01-01 DIAGNOSIS — Z992 Dependence on renal dialysis: Secondary | ICD-10-CM | POA: Diagnosis not present

## 2022-01-01 DIAGNOSIS — E11621 Type 2 diabetes mellitus with foot ulcer: Secondary | ICD-10-CM | POA: Diagnosis not present

## 2022-01-01 DIAGNOSIS — N2581 Secondary hyperparathyroidism of renal origin: Secondary | ICD-10-CM | POA: Diagnosis not present

## 2022-01-01 DIAGNOSIS — N186 End stage renal disease: Secondary | ICD-10-CM | POA: Diagnosis not present

## 2022-01-01 DIAGNOSIS — I5042 Chronic combined systolic (congestive) and diastolic (congestive) heart failure: Secondary | ICD-10-CM | POA: Diagnosis not present

## 2022-01-01 DIAGNOSIS — M86672 Other chronic osteomyelitis, left ankle and foot: Secondary | ICD-10-CM | POA: Diagnosis not present

## 2022-01-01 LAB — GLUCOSE, CAPILLARY
Glucose-Capillary: 111 mg/dL — ABNORMAL HIGH (ref 70–99)
Glucose-Capillary: 165 mg/dL — ABNORMAL HIGH (ref 70–99)

## 2022-01-01 NOTE — Progress Notes (Signed)
Afonso, [place] ([id number]) ?Visit Report for 01/01/2022 ?Arrival Information Details ?Patient Name: [name], [name] ?Date of Service: 01/01/2022 10:30 AM ?Medical Record Number: [id number] ?Patient Account Number: [id number] ?Date of Birth/Sex: [birth year] (56 y.o. M) ?Treating RN: Donnamarie Poag ?Primary Care Gates Jividen: [name] ?Other Clinician: Jacqulyn Bath ?Referring Davyon Fisch: [name] ?Treating Zyrell Carmean/Extender: Kalman Shan ?Weeks in Treatment: 8 ?Visit Information History Since Last Visit ?Added or deleted any medications: No ?Patient Arrived: Ambulatory ?Any new allergies or adverse reactions: No ?Arrival Time: 10:20 ?Had a fall or experienced change in No ?Accompanied By: self ?activities of daily living that may affect ?Transfer Assistance: None ?risk of falls: ?Patient Identification Verified: Yes ?Signs or symptoms of abuse/neglect since last visito No ?Secondary Verification Process Completed: Yes ?Hospitalized since last visit: No ?Patient Requires Transmission-Based No ?Implantable device outside of the clinic excluding No ?Precautions: ?cellular tissue based products placed in the center ?Patient Has Alerts: Yes ?since last visit: ?Patient Alerts: Patient on Blood ?Has Dressing in Place as Prescribed: Yes ?Thinner ?Has Footwear/Offloading in Place as Prescribed: Yes ?DIALYSIS RIGHT ARM ?Left: Surgical Shoe with Pressure ?Aspirin 81/Brilinta ?Relief Insole ?Pain Present Now: No ?Electronic Signature(s) ?Signed: 01/01/2022 2:26:23 PM By: Enedina Finner RCP, RRT, CHT ?Entered By: Enedina Finner on 01/01/2022 10:35:43 ?[place], [place] ([id number]) ?-------------------------------------------------------------------------------- ?Encounter Discharge Information Details ?Patient Name: [name], [name] ?Date of Service: 01/01/2022 10:30 AM ?Medical Record Number: [id number] ?Patient Account Number: [id number] ?Date of Birth/Sex: [birth year] (56 y.o. M) ?Treating RN: Donnamarie Poag ?Primary  Care Libbi Towner: [name] ?Other Clinician: Jacqulyn Bath ?Referring Normon Pettijohn: [name] ?Treating Cid Agena/Extender: Kalman Shan ?Weeks in Treatment: 8 ?Encounter Discharge Information Items ?Discharge Condition: Stable ?Ambulatory Status: Ambulatory ?Discharge Destination: Home ?Transportation: Private Auto ?Accompanied By: self ?Schedule Follow-up Appointment: Yes ?Clinical Summary of Care: ?Notes ?Patient has an HBO treatment scheduled on 01/02/22 at 08:00 am. ?Electronic Signature(s) ?Signed: 01/01/2022 2:26:23 PM By: Enedina Finner RCP, RRT, CHT ?Entered By: Enedina Finner on 01/01/2022 14:20:44 ?[place]ose, [place] ([id number]) ?-------------------------------------------------------------------------------- ?Vitals Details ?Patient Name: [name], [name] ?Date of Service: 01/01/2022 10:30 AM ?Medical Record Number: [id number] ?Patient Account Number: [id number] ?Date of Birth/Sex: 1966/09/14 (56 y.o. M) ?Treating RN: Donnamarie Poag ?Primary Care Erminio Nygard: [name] ?Other Clinician: Jacqulyn Bath ?Referring Ieesha Abbasi: [name] ?Treating Panfilo Ketchum/Extender: Kalman Shan ?Weeks in Treatment: 8 ?Vital Signs ?Time Taken: 10:24 ?Temperature (??F): 98.9 ?Height (in): 70 ?Pulse (bpm): 72 ?Weight (lbs): 180.4 ?Respiratory Rate (breaths/min): 16 ?Body Mass Index (BMI): 25.9 ?Blood Pressure (mmHg): 120/70 ?Capillary Blood Glucose (mg/dl): 111 ?Reference Range: 80 - 120 mg / dl ?Electronic Signature(s) ?Signed: 01/01/2022 2:26:23 PM By: Enedina Finner RCP, RRT, CHT ?Entered By: Enedina Finner on 01/01/2022 10:42:34 ?

## 2022-01-01 NOTE — Progress Notes (Signed)
[place] ([id number]) ?Visit Report for 01/01/2022 ?HBO Details ?Patient Name: [name], [name] ?Date of Service: 01/01/2022 10:30 AM ?Medical Record Number: [id number] ?Patient Account Number: [id number] ?Date of Birth/Sex: [birth year] (56 y.o. M) ?Treating RN: Donnamarie Poag ?Primary Care Buddie Marston: [name] ?Other Clinician: Jacqulyn Bath ?Referring Amaka Gluth: [name] ?Treating Anu Stagner/Extender: Kalman Shan ?Weeks in Treatment: 8 ?HBO Treatment Course Details ?Treatment Course Number: 1 ?Ordering Frank Novelo: Jeri Cos ?Total Treatments Ordered: 40 ?HBO Treatment Start Date: 11/04/2021 ?HBO Indication: ?Chronic Refractory Osteomyelitis to Left Foot ?HBO Treatment Details ?Treatment Number: 90 ?Patient Type: Outpatient ?Chamber Type: Monoplace ?Chamber Serial #: [id number] ?Treatment Protocol: 2.0 ATA with 90 minutes oxygen, and no air breaks ?Treatment Details ?Compression Rate Down: 1.0 psi / minute ?De-Compression Rate Up: 1.5 psi / minute ?Compress Tx Pressure Air breaks and breathing periods Decompress Decompress ?Begins Reached (leave unused spaces blank) Begins Ends ?Chamber Pressure (ATA) 1 2 - - - - - - 2 1 ?Clock Time (24 hr) 10:41 10:58 - - - - - - 12:28 12:38 ?Treatment Length: 117 (minutes) ?Treatment Segments: 4 ?Vital Signs ?Capillary Blood Glucose Reference Range: 80 - 120 mg / dl ?HBO Diabetic Blood Glucose Intervention Range: <131 mg/dl or >249 mg/dl ?Time Vitals Blood Respiratory Capillary Blood Glucose Pulse Action ?Type: ?Pulse: Temperature: ?Taken: ?Pressure: ?Rate: ?Glucose (mg/dl): Meter #: Oximetry (%) Taken: ?Pre 10:24 120/70 72 16 98.9 111 1 none - patient just ate ?Post 12:44 142/70 60 16 98.5 165 1 none per protocol ?Treatment Response ?Treatment Toleration: Well ?Treatment Completion ?Treatment Completed without Adverse Event ?Status: ?HBO Attestation ?I certify that I supervised this HBO treatment in accordance with ?Medicare guidelines. A trained emergency response team is  readily Yes ?available per hospital policies and procedures. ?Continue HBOT as ordered. Yes ?Electronic Signature(s) ?Signed: 01/01/2022 3:31:55 PM By: Kalman Shan DO ?Previous Signature: 01/01/2022 2:26:23 PM Version By: Enedina Finner RCP, RRT, CHT ?Entered By: Kalman Shan on 01/01/2022 15:30:55 ?Alia, [place] ([id number]) ?-------------------------------------------------------------------------------- ?HBO Safety Checklist Details ?Patient Name: [name], [name] ?Date of Service: 01/01/2022 10:30 AM ?Medical Record Number: [id number] ?Patient Account Number: [id number] ?Date of Birth/Sex: [birth year] (56 y.o. M) ?Treating RN: Donnamarie Poag ?Primary Care Ryane Canavan: [name] ?Other Clinician: Jacqulyn Bath ?Referring Makayla Lanter: [name] ?Treating Sima Lindenberger/Extender: Kalman Shan ?Weeks in Treatment: 8 ?HBO Safety Checklist Items ?Safety Checklist ?Consent Form Signed ?Patient voided / foley secured and emptied ?When did you last eato 10:00 am ?Last dose of injectable or oral agent n/a ?Ostomy pouch emptied and vented if applicable ?NA ?All implantable devices assessed, documented and approved ?NA ?Intravenous access site secured and place ?NA ?Valuables secured ?Linens and cotton and cotton/polyester blend (less than 51% polyester) ?Personal oil-based products / skin lotions / body lotions removed ?Wigs or hairpieces removed ?NA ?Smoking or tobacco materials removed ?NA ?Books / newspapers / magazines / loose paper removed ?NA ?Cologne, aftershave, perfume and deodorant removed ?Jewelry removed (may wrap wedding band) ?Make-up removed ?Hair care products removed ?Battery operated devices (external) removed ?NA ?Heating patches and chemical warmers removed ?NA ?Titanium eyewear removed ?NA ?Nail polish cured greater than 10 hours ?NA ?Casting material cured greater than 10 hours ?NA ?Hearing aids removed ?NA ?Loose dentures or partials removed ?NA ?Prosthetics have been removed ?NA ?Patient  demonstrates correct use of air break device (if applicable) ?Patient concerns have been addressed ?Patient grounding bracelet on and cord attached to chamber ?Specifics for Inpatients (complete in addition to ?above) ?Medication sheet sent with patient ?Intravenous medications needed or due  during therapy sent with patient ?Drainage tubes (e.g. nasogastric tube or chest tube secured and vented) ?Endotracheal or Tracheotomy tube secured ?Cuff deflated of air and inflated with saline ?Airway suctioned ?Electronic Signature(s) ?Signed: 01/01/2022 2:26:23 PM By: Enedina Finner RCP, RRT, CHT ?Entered By: Enedina Finner on 01/01/2022 10:43:55 ?

## 2022-01-02 ENCOUNTER — Encounter: Payer: Medicare Other | Admitting: Physician Assistant

## 2022-01-02 DIAGNOSIS — I5042 Chronic combined systolic (congestive) and diastolic (congestive) heart failure: Secondary | ICD-10-CM | POA: Diagnosis not present

## 2022-01-02 DIAGNOSIS — I110 Hypertensive heart disease with heart failure: Secondary | ICD-10-CM | POA: Diagnosis not present

## 2022-01-02 DIAGNOSIS — E11621 Type 2 diabetes mellitus with foot ulcer: Secondary | ICD-10-CM | POA: Diagnosis not present

## 2022-01-02 DIAGNOSIS — L97524 Non-pressure chronic ulcer of other part of left foot with necrosis of bone: Secondary | ICD-10-CM | POA: Diagnosis not present

## 2022-01-02 DIAGNOSIS — M86672 Other chronic osteomyelitis, left ankle and foot: Secondary | ICD-10-CM | POA: Diagnosis not present

## 2022-01-02 DIAGNOSIS — I2510 Atherosclerotic heart disease of native coronary artery without angina pectoris: Secondary | ICD-10-CM | POA: Diagnosis not present

## 2022-01-02 LAB — GLUCOSE, CAPILLARY
Glucose-Capillary: 168 mg/dL — ABNORMAL HIGH (ref 70–99)
Glucose-Capillary: 180 mg/dL — ABNORMAL HIGH (ref 70–99)

## 2022-01-02 NOTE — Progress Notes (Signed)
Griebel, [place] ([id number]) ?Visit Report for 01/02/2022 ?Arrival Information Details ?Patient Name: Masaki, Jorrell L. ?Date of Service: 01/02/2022 8:00 AM ?Medical Record Number: [id number] ?Patient Account Number: [id number] ?Date of Birth/Sex: Sep 07, 1966 (56 y.o. M) ?Treating RN: Levora Dredge ?Primary Care Kishana Battey: [name] ?Other Clinician: Jacqulyn Bath ?Referring Maisa Bedingfield: [name] ?Treating Ferrell Claiborne/Extender: Jeri Cos ?Weeks in Treatment: 9 ?Visit Information History Since Last Visit ?Added or deleted any medications: No ?Patient Arrived: Ambulatory ?Any new allergies or adverse reactions: No ?Arrival Time: 08:30 ?Had a fall or experienced change in No ?Accompanied By: self ?activities of daily living that may affect ?Transfer Assistance: None ?risk of falls: ?Patient Identification Verified: Yes ?Signs or symptoms of abuse/neglect since last visito No ?Secondary Verification Process Completed: Yes ?Hospitalized since last visit: No ?Patient Requires Transmission-Based No ?Implantable device outside of the clinic excluding No ?Precautions: ?cellular tissue based products placed in the center ?Patient Has Alerts: Yes ?since last visit: ?Patient Alerts: Patient on Blood ?Has Dressing in Place as Prescribed: Yes ?Thinner ?Has Footwear/Offloading in Place as Prescribed: Yes ?DIALYSIS RIGHT ARM ?Left: Surgical Shoe with Pressure ?Aspirin 81/Brilinta ?Relief Insole ?Pain Present Now: Yes ?Electronic Signature(s) ?Signed: 01/02/2022 2:54:38 PM By: Enedina Finner RCP, RRT, CHT ?Entered By: Enedina Finner on 01/02/2022 09:02:15 ?Lo, [place] ([id number]) ?-------------------------------------------------------------------------------- ?Encounter Discharge Information Details ?Patient Name: Hindle, Karim L. ?Date of Service: 01/02/2022 8:00 AM ?Medical Record Number: [id number] ?Patient Account Number: [id number] ?Date of Birth/Sex: 1966-03-17 (56 y.o. M) ?Treating RN: Levora Dredge ?Primary Care Shawneen Deetz: [name] ?Other Clinician: Jacqulyn Bath ?Referring Nira Visscher: [name] ?Treating Rease [name]/Extender: Jeri Cos ?Weeks in Treatment: 9 ?Encounter Discharge Information Items ?Discharge Condition: Stable ?Ambulatory Status: Ambulatory ?Discharge Destination: Home ?Transportation: Private Auto ?Accompanied By: self ?Schedule Follow-up Appointment: Yes ?Clinical Summary of Care: ?Notes ?Patient has an HBO treatment scheduled on 01/03/22. ?Electronic Signature(s) ?Signed: 01/02/2022 2:54:38 PM By: Enedina Finner RCP, RRT, CHT ?Entered By: Enedina Finner on 01/02/2022 11:31:12 ?[place], [place] ([id number]) ?-------------------------------------------------------------------------------- ?Vitals Details ?Patient Name: Netz, Kaulana L. ?Date of Service: 01/02/2022 8:00 AM ?Medical Record Number: [id number] ?Patient Account Number: [id number] ?Date of Birth/Sex: 11-13-1965 (56 y.o. M) ?Treating RN: Levora Dredge ?Primary Care Ivett Luebbe: [name] ?Other Clinician: Jacqulyn Bath ?Referring Oma Alpert: [name] ?Treating Pama Roskos/Extender: Jeri Cos ?Weeks in Treatment: 9 ?Vital Signs ?Time Taken: 08:30 ?Temperature (??F): 98.6 ?Height (in): 70 ?Pulse (bpm): 72 ?Weight (lbs): 180.4 ?Respiratory Rate (breaths/min): 18 ?Body Mass Index (BMI): 25.9 ?Blood Pressure (mmHg): 128/68 ?Capillary Blood Glucose (mg/dl): 168 ?Reference Range: 80 - 120 mg / dl ?Electronic Signature(s) ?Signed: 01/02/2022 2:54:38 PM By: Enedina Finner RCP, RRT, CHT ?Entered By: Enedina Finner on 01/02/2022 09:03:42 ?

## 2022-01-03 ENCOUNTER — Other Ambulatory Visit: Payer: Self-pay

## 2022-01-03 ENCOUNTER — Encounter: Payer: Medicare Other | Admitting: Physician Assistant

## 2022-01-03 DIAGNOSIS — L97822 Non-pressure chronic ulcer of other part of left lower leg with fat layer exposed: Secondary | ICD-10-CM | POA: Diagnosis not present

## 2022-01-03 DIAGNOSIS — D631 Anemia in chronic kidney disease: Secondary | ICD-10-CM | POA: Diagnosis not present

## 2022-01-03 DIAGNOSIS — E11621 Type 2 diabetes mellitus with foot ulcer: Secondary | ICD-10-CM | POA: Diagnosis not present

## 2022-01-03 DIAGNOSIS — E782 Mixed hyperlipidemia: Secondary | ICD-10-CM | POA: Diagnosis not present

## 2022-01-03 DIAGNOSIS — E1151 Type 2 diabetes mellitus with diabetic peripheral angiopathy without gangrene: Secondary | ICD-10-CM | POA: Diagnosis not present

## 2022-01-03 DIAGNOSIS — Z89431 Acquired absence of right foot: Secondary | ICD-10-CM | POA: Diagnosis not present

## 2022-01-03 DIAGNOSIS — Z955 Presence of coronary angioplasty implant and graft: Secondary | ICD-10-CM | POA: Diagnosis not present

## 2022-01-03 DIAGNOSIS — N2581 Secondary hyperparathyroidism of renal origin: Secondary | ICD-10-CM | POA: Diagnosis not present

## 2022-01-03 DIAGNOSIS — N186 End stage renal disease: Secondary | ICD-10-CM | POA: Diagnosis not present

## 2022-01-03 DIAGNOSIS — I110 Hypertensive heart disease with heart failure: Secondary | ICD-10-CM | POA: Diagnosis not present

## 2022-01-03 DIAGNOSIS — Z7902 Long term (current) use of antithrombotics/antiplatelets: Secondary | ICD-10-CM | POA: Diagnosis not present

## 2022-01-03 DIAGNOSIS — Z89412 Acquired absence of left great toe: Secondary | ICD-10-CM | POA: Diagnosis not present

## 2022-01-03 DIAGNOSIS — Z992 Dependence on renal dialysis: Secondary | ICD-10-CM | POA: Diagnosis not present

## 2022-01-03 DIAGNOSIS — M86672 Other chronic osteomyelitis, left ankle and foot: Secondary | ICD-10-CM | POA: Diagnosis not present

## 2022-01-03 DIAGNOSIS — Z89422 Acquired absence of other left toe(s): Secondary | ICD-10-CM | POA: Diagnosis not present

## 2022-01-03 DIAGNOSIS — I2510 Atherosclerotic heart disease of native coronary artery without angina pectoris: Secondary | ICD-10-CM | POA: Diagnosis not present

## 2022-01-03 DIAGNOSIS — Z7982 Long term (current) use of aspirin: Secondary | ICD-10-CM | POA: Diagnosis not present

## 2022-01-03 DIAGNOSIS — E1142 Type 2 diabetes mellitus with diabetic polyneuropathy: Secondary | ICD-10-CM | POA: Diagnosis not present

## 2022-01-03 DIAGNOSIS — L97524 Non-pressure chronic ulcer of other part of left foot with necrosis of bone: Secondary | ICD-10-CM | POA: Diagnosis not present

## 2022-01-03 DIAGNOSIS — I5042 Chronic combined systolic (congestive) and diastolic (congestive) heart failure: Secondary | ICD-10-CM | POA: Diagnosis not present

## 2022-01-03 DIAGNOSIS — E1122 Type 2 diabetes mellitus with diabetic chronic kidney disease: Secondary | ICD-10-CM | POA: Diagnosis not present

## 2022-01-03 DIAGNOSIS — Z4781 Encounter for orthopedic aftercare following surgical amputation: Secondary | ICD-10-CM | POA: Diagnosis not present

## 2022-01-03 DIAGNOSIS — Z9181 History of falling: Secondary | ICD-10-CM | POA: Diagnosis not present

## 2022-01-03 DIAGNOSIS — I120 Hypertensive chronic kidney disease with stage 5 chronic kidney disease or end stage renal disease: Secondary | ICD-10-CM | POA: Diagnosis not present

## 2022-01-03 DIAGNOSIS — E039 Hypothyroidism, unspecified: Secondary | ICD-10-CM | POA: Diagnosis not present

## 2022-01-03 LAB — GLUCOSE, CAPILLARY
Glucose-Capillary: 247 mg/dL — ABNORMAL HIGH (ref 70–99)
Glucose-Capillary: 235 mg/dL — ABNORMAL HIGH (ref 70–99)

## 2022-01-03 NOTE — Progress Notes (Signed)
C[place]ell, [place] ([id number]) ?Visit Report for 01/02/2022 ?HBO Details ?Patient Name: [name], [name] ?Date of Service: 01/02/2022 8:00 AM ?Medical Record Number: [id number] ?Patient Account Number: [id number] ?Date of Birth/Sex: 06-21-1966 (56 y.o. M) ?Treating RN: Levora Dredge ?Primary Care Adalea Handler: [name] ?Other Clinician: Jacqulyn Bath ?Referring Amrie Gurganus: [name] ?Treating Addeline Calarco/Extender: Jeri Cos ?Weeks in Treatment: 9 ?HBO Treatment Course Details ?Treatment Course Number: 1 ?Ordering Tristan Proto: Jeri Cos ?Total Treatments Ordered: 40 ?HBO Treatment Start Date: 11/04/2021 ?HBO Indication: ?Chronic Refractory Osteomyelitis to Left Foot ?HBO Treatment Details ?Treatment Number: 38 ?Patient Type: Outpatient ?Chamber Type: Monoplace ?Chamber Serial #: [id number] ?Treatment Protocol: 2.0 ATA with 90 minutes oxygen, and no air breaks ?Treatment Details ?Compression Rate Down: 1.0 psi / minute ?De-Compression Rate Up: 1.0 psi / minute ?Compress Tx Pressure Air breaks and breathing periods Decompress Decompress ?Begins Reached (leave unused spaces blank) Begins Ends ?Chamber Pressure (ATA) 1 2 - - - - - - 2 1 ?Clock Time (24 hr) 08:40 08:57 - - - - - - 10:27 10:44 ?Treatment Length: 124 (minutes) ?Treatment Segments: 4 ?Vital Signs ?Capillary Blood Glucose Reference Range: 80 - 120 mg / dl ?HBO Diabetic Blood Glucose Intervention Range: <131 mg/dl or >249 mg/dl ?Time Vitals Blood Respiratory Capillary Blood Glucose Pulse Action ?Type: ?Pulse: Temperature: ?Taken: ?Pressure: ?Rate: ?Glucose (mg/dl): ?Meter #: Oximetry (%) Taken: ?Pre 08:30 128/68 72 18 98.6 168 1 none per protocol ?Post 10:51 122/68 66 18 98.7 180 1 none per protocol ?Treatment Response ?Treatment Toleration: Well ?Treatment Completion ?Treatment Completed without Adverse Event ?Status: ?Electronic Signature(s) ?Signed: 01/02/2022 2:54:38 PM By: Enedina Finner RCP, RRT, CHT ?Signed: 01/03/2022 5:45:40 PM By: Worthy Keeler  PA-C ?Entered By: Enedina Finner on 01/02/2022 11:29:54 ?[place], [place] ([id number]) ?-------------------------------------------------------------------------------- ?HBO Safety Checklist Details ?Patient Name: [name], [name] ?Date of Service: 01/02/2022 8:00 AM ?Medical Record Number: [id number] ?Patient Account Number: [id number] ?Date of Birth/Sex: [birth year] (56 y.o. M) ?Treating RN: Levora Dredge ?Primary Care Aitana Burry: [name] ?Other Clinician: Jacqulyn Bath ?Referring Jacon Whetzel: [name] ?Treating Kashari Chalmers/Extender: Jeri Cos ?Weeks in Treatment: 9 ?HBO Safety Checklist Items ?Safety Checklist ?Consent Form Signed ?Patient voided / foley secured and emptied ?When did you last eato 07:00 am ?Last dose of injectable or oral agent n/a ?Ostomy pouch emptied and vented if applicable ?NA ?All implantable devices assessed, documented and approved ?NA ?Intravenous access site secured and place ?NA ?Valuables secured ?Linens and cotton and cotton/polyester blend (less than 51% polyester) ?Personal oil-based products / skin lotions / body lotions removed ?Wigs or hairpieces removed ?NA ?Smoking or tobacco materials removed ?NA ?Books / newspapers / magazines / loose paper removed ?NA ?Cologne, aftershave, perfume and deodorant removed ?Jewelry removed (may wrap wedding band) ?Make-up removed ?NA ?Hair care products removed ?Battery operated devices (external) removed ?NA ?Heating patches and chemical warmers removed ?NA ?Titanium eyewear removed ?NA ?Nail polish cured greater than 10 hours ?NA ?Casting material cured greater than 10 hours ?NA ?Hearing aids removed ?NA ?Loose dentures or partials removed ?NA ?Prosthetics have been removed ?NA ?Patient demonstrates correct use of air break device (if applicable) ?Patient concerns have been addressed ?Patient grounding bracelet on and cord attached to chamber ?Specifics for Inpatients (complete in addition to ?above) ?Medication sheet sent with  patient ?Intravenous medications needed or due during therapy sent with patient ?Drainage tubes (e.g. nasogastric tube or chest tube secured and vented) ?Endotracheal or Tracheotomy tube secured ?Cuff deflated of air and inflated with saline ?Airway suctioned ?Electronic Signature(s) ?Signed: 01/02/2022  2:54:38 PM By: Enedina Finner RCP, RRT, CHT ?Entered By: Enedina Finner on 01/02/2022 09:05:29 ?

## 2022-01-03 NOTE — Progress Notes (Signed)
Nan, Hatley ([id number]) ?Visit Report for 01/03/2022 ?Arrival Information Details ?Patient Name: [name], [name] ?Date of Service: 01/03/2022 10:30 AM ?Medical Record Number: [id number] ?Patient Account Number: [id number] ?Date of Birth/Sex: 1965/12/12 (56 y.o. M) ?Treating RN: Levora Dredge ?Primary Care Devonn Giampietro: [name] ?Other Clinician: Jacqulyn Bath ?Referring Yarima Penman: [name] ?Treating Frank Novelo/Extender: Jeri Cos ?Weeks in Treatment: 9 ?Visit Information History Since Last Visit ?Added or deleted any medications: No ?Patient Arrived: Ambulatory ?Any new allergies or adverse reactions: No ?Arrival Time: 10:30 ?Had a fall or experienced change in No ?Accompanied By: self ?activities of daily living that may affect ?Transfer Assistance: None ?risk of falls: ?Patient Identification Verified: Yes ?Signs or symptoms of abuse/neglect since last visito No ?Secondary Verification Process Completed: Yes ?Hospitalized since last visit: No ?Patient Requires Transmission-Based No ?Implantable device outside of the clinic excluding No ?Precautions: ?cellular tissue based products placed in the center ?Patient Has Alerts: Yes ?since last visit: ?Patient Alerts: Patient on Blood ?Pain Present Now: No ?Thinner ?DIALYSIS RIGHT ARM ?Aspirin 81/Brilinta ?Electronic Signature(s) ?Signed: 01/03/2022 2:38:11 PM By: Enedina Finner RCP, RRT, CHT ?Entered By: Enedina Finner on 01/03/2022 11:45:35 ?Desile[place], [place] ([id number]) ?-------------------------------------------------------------------------------- ?Encounter Discharge Information Details ?Patient Name: [name], [name] ?Date of Service: 01/03/2022 10:30 AM ?Medical Record Number: [id number] ?Patient Account Number: [id number] ?Date of Birth/Sex: 1965-12-03 (57 y.o. M) ?Treating RN: Levora Dredge ?Primary Care Crystie Yanko: [name] ?Other Clinician: Jacqulyn Bath ?Referring Kymberlyn Eckford: [name] ?Treating Yonathan Perrow/Extender: Jeri Cos ?Weeks in Treatment: 9 ?Encounter Discharge Information Items ?Discharge Condition: Stable ?Ambulatory Status: Ambulatory ?Discharge Destination: Home ?Transportation: Private Auto ?Accompanied By: self ?Schedule Follow-up Appointment: Yes ?Clinical Summary of Care: ?Notes ?Patient has an HBO treatment scheduled on 01/06/22 at 10:30 am. ?Electronic Signature(s) ?Signed: 01/03/2022 2:38:11 PM By: Enedina Finner RCP, RRT, CHT ?Entered By: Enedina Finner on 01/03/2022 13:14:45 ?[place], [place] ([id number]) ?-------------------------------------------------------------------------------- ?Vitals Details ?Patient Name: [name], [name] ?Date of Service: 01/03/2022 10:30 AM ?Medical Record Number: [id number] ?Patient Account Number: [id number] ?Date of Birth/Sex: [birth year] (56 y.o. M) ?Treating RN: Levora Dredge ?Primary Care Henrietta Cieslewicz: [name] ?Other Clinician: Jacqulyn Bath ?Referring Gorgeous Newlun: [name] ?Treating Anjana Cheek/Extender: Jeri Cos ?Weeks in Treatment: 9 ?Vital Signs ?Time Taken: 10:31 ?Temperature (??F): 98.6 ?Height (in): 70 ?Pulse (bpm): 66 ?Weight (lbs): 180.4 ?Respiratory Rate (breaths/min): 16 ?Body Mass Index (BMI): 25.9 ?Blood Pressure (mmHg): 136/76 ?Capillary Blood Glucose (mg/dl): 247 ?Reference Range: 80 - 120 mg / dl ?Electronic Signature(s) ?Signed: 01/03/2022 2:38:11 PM By: Enedina Finner RCP, RRT, CHT ?Entered By: Enedina Finner on 01/03/2022 11:46:44 ?

## 2022-01-03 NOTE — Progress Notes (Signed)
[name], [place] ([id number]) ?Visit Report for 01/03/2022 ?Arrival Information Details ?Patient Name: [name], [name] ?Date of Service: 01/03/2022 1:15 PM ?Medical Record Number: [id number] ?Patient Account Number: [id number] ?Date of Birth/Sex: Mar 10, 1966 (56 y.o. M) ?Treating RN: Donnamarie Poag ?Primary Care Ziah Leandro: [name] Other Clinician: ?Referring Rhyan Radler: [name] ?Treating Ronnesha Mester/Extender: Jeri Cos ?Weeks in Treatment: 9 ?Visit Information History Since Last Visit ?Added or deleted any medications: No ?Patient Arrived: Ambulatory ?Had a fall or experienced change in No ?Arrival Time: 13:15 ?activities of daily living that may affect ?Accompanied By: self ?risk of falls: ?Transfer Assistance: None ?Hospitalized since last visit: No ?Patient Identification Verified: Yes ?Has Dressing in Place as Prescribed: Yes ?Secondary Verification Process Completed: Yes ?Pain Present Now: No ?Patient Requires Transmission-Based No ?Precautions: ?Patient Has Alerts: Yes ?Patient Alerts: Patient on Blood ?Thinner ?DIALYSIS RIGHT ARM ?Aspirin 81/Brilinta ?Electronic Signature(s) ?Signed: 01/03/2022 4:01:57 PM By: Donnamarie Poag ?Entered ByDonnamarie Poag on 01/03/2022 13:16:04 ?[place], [place] ([id number]) ?-------------------------------------------------------------------------------- ?Clinic Level of Care Assessment Details ?Patient Name: [name], [name] ?Date of Service: 01/03/2022 1:15 PM ?Medical Record Number: [id number] ?Patient Account Number: [id number] ?Date of Birth/Sex: 1966/04/30 (56 y.o. M) ?Treating RN: [name] ?Primary Care Shakira Los: [name] Other Clinician: ?Referring Aaban Griep: [name] ?Treating Ingvald Theisen/Extender: Jeri Cos ?Weeks in Treatment: 9 ?Clinic Level of Care Assessment Items ?TOOL 1 Quantity Score ?[]  - Use when EandM and Procedure is performed on INITIAL visit 0 ?ASSESSMENTS - Nursing Assessment / Reassessment ?[]  - General Physical Exam (combine w/ comprehensive assessment  (listed just below) when performed on new ?0 ?pt. evals) ?[]  - 0 ?Comprehensive Assessment (HX, ROS, Risk Assessments, Wounds Hx, etc.) ?ASSESSMENTS - Wound and Skin Assessment / Reassessment ?[]  - Dermatologic / Skin Assessment (not related to wound area) 0 ?ASSESSMENTS - Ostomy and/or Continence Assessment and Care ?[]  - Incontinence Assessment and Management 0 ?[]  - 0 ?Ostomy Care Assessment and Management (repouching, etc.) ?PROCESS - Coordination of Care ?[]  - Simple Patient / Family Education for ongoing care 0 ?[]  - 0 ?Complex (extensive) Patient / Family Education for ongoing care ?[]  - 0 ?Staff obtains Consents, Records, Test Results / Process Orders ?[]  - 0 ?Staff telephones HHA, Nursing Homes / Clarify orders / etc ?[]  - 0 ?Routine Transfer to another Facility (non-emergent condition) ?[]  - 0 ?Routine Hospital Admission (non-emergent condition) ?[]  - 0 ?New Admissions / Biomedical engineer / Ordering NPWT, Apligraf, etc. ?[]  - 0 ?Emergency Hospital Admission (emergent condition) ?PROCESS - Special Needs ?[]  - Pediatric / Minor Patient Management 0 ?[]  - 0 ?Isolation Patient Management ?[]  - 0 ?Hearing / Language / Visual special needs ?[]  - 0 ?Assessment of Community assistance (transportation, D/C planning, etc.) ?[]  - 0 ?Additional assistance / Altered mentation ?[]  - 0 ?Support Surface(s) Assessment (bed, cushion, seat, etc.) ?INTERVENTIONS - Miscellaneous ?[]  - External ear exam 0 ?[]  - 0 ?Patient Transfer (multiple staff / Civil Service fast streamer / Similar devices) ?[]  - 0 ?Simple Staple / Suture removal (25 or less) ?[]  - 0 ?Complex Staple / Suture removal (26 or more) ?[]  - 0 ?Hypo/Hyperglycemic Management (do not check if billed separately) ?[]  - 0 ?Ankle / Brachial Index (ABI) - do not check if billed separately ?Has the patient been seen at the hospital within the last three years: Yes ?Total Score: 0 ?Level Of Care: ____ ?[place], [place] ([id number]) ?Electronic Signature(s) ?Signed: 01/03/2022 4:20:50  PM By: [name] RN ?Entered By: [name] on 01/03/2022 13:50:54 ?[place], [place] ([id number]) ?-------------------------------------------------------------------------------- ?Encounter Discharge Information Details ?  Patient Name: [name], [name] ?Date of Service: 01/03/2022 1:15 PM ?Medical Record Number: [id number] ?Patient Account Number: [id number] ?Date of Birth/Sex: [birth year] (56 y.o. M) ?Treating RN: [name] ?Primary Care Marinus Eicher: [name] Other Clinician: ?Referring Manreet Kiernan: [name] ?Treating Rohnan Bartleson/Extender: Jeri Cos ?Weeks in Treatment: 9 ?Encounter Discharge Information Items ?Discharge Condition: Stable ?Ambulatory Status: Ambulatory ?Discharge Destination: Home ?Transportation: Private Auto ?Accompanied By: self ?Schedule Follow-up Appointment: Yes ?Clinical Summary of Care: Patient Declined ?Electronic Signature(s) ?Signed: 01/03/2022 1:54:04 PM By: [name] RN ?Entered By: [name] on 01/03/2022 13:54:04 ?Gagliardo, [place] ([id number]) ?-------------------------------------------------------------------------------- ?Lower Extremity Assessment Details ?Patient Name: [name], [name] ?Date of Service: 01/03/2022 1:15 PM ?Medical Record Number: [id number] ?Patient Account Number: [id number] ?Date of Birth/Sex: [birth year] (56 y.o. M) ?Treating RN: Donnamarie Poag ?Primary Care Kresta Templeman: [name] Other Clinician: ?Referring Saamiya Jeppsen: [name] ?Treating Verlena Marlette/Extender: Jeri Cos ?Weeks in Treatment: 9 ?Edema Assessment ?Assessed: [Left: Yes] [Right: No] ?Edema: [Left: N] [Right: o] ?Calf ?Left: Right: ?Point of Measurement: 33 cm From Medial Instep 37 cm ?Ankle ?Left: Right: ?Point of Measurement: 10 cm From Medial Instep 27 cm ?Electronic Signature(s) ?Signed: 01/03/2022 4:01:57 PM By: Donnamarie Poag ?Entered ByDonnamarie Poag on 01/03/2022 13:23:40 ?Care, [place] ([id number]) ?-------------------------------------------------------------------------------- ?Multi Wound  Chart Details ?Patient Name: [name], [name] ?Date of Service: 01/03/2022 1:15 PM ?Medical Record Number: [id number] ?Patient Account Number: [id number] ?Date of Birth/Sex: 12-31-1965 (56 y.o. 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 ?Comorbid History: Cataracts, Anemia, Sleep Apnea, Cataracts, Anemia, Sleep Apnea, N/A ?Arrhythmia, Congestive Heart Arrhythmia, Congestive Heart ?Failure, Coronary Artery Disease, Failure, Coronary Artery Disease, ?Hypertension, Myocardial Infarction, Hypertension, Myocardial Infarction, ?Type II Diabetes, Neuropathy, Type II Diabetes, Neuropathy, ?Seizure Disorder Seizure Disorder ?Date Acquired: 09/09/2021 10/28/2021 N/A ?Weeks of Treatment: 6 5 N/A ?Wound Status: Open Open N/A ?Wound Recurrence: No No N/A ?Measurements L x W x D (cm) 3x2x1.8 2x2x0.1 N/A ?Area (cm?) : 4.712 3.142 N/A ?Volume (cm?) : 8.482 0.314 N/A ?% Reduction in Area: 66.70% 0.00% N/A ?% Reduction in Volume: 79.30% 0.00% N/A ?Classification: Full Thickness With Exposed Grade 2 N/A ?Support Structures ?Exudate Amount: Medium None Present N/A ?Exudate Type: Serosanguineous N/A N/A ?Exudate Color: red, brown N/A N/A ?Granulation Amount: Medium (34-66%) None Present (0%) N/A ?Granulation Quality: Red N/A N/A ?Necrotic Amount: Medium (34-66%) Large (67-100%) N/A ?Necrotic Tissue: Adherent Slough Eschar N/A ?Exposed Structures: ?Fascia: Yes ?Fascia: No  N/A ?Fat Layer (Subcutaneous Tissue): Fat Layer (Subcutaneous Tissue): ?Yes No ?Tendon: Yes ?Tendon: No ?Muscle: Yes ?Muscle: No ?Joint: No ?Joint: No ?Bone: No ?Bone: No ?Epithelialization: None None N/A

## 2022-01-03 NOTE — Progress Notes (Signed)
Brummet, [place] ([id number]) ?Visit Report for 01/03/2022 ?HBO Details ?Patient Name: [name], [name] ?Date of Service: 01/03/2022 10:30 AM ?Medical Record Number: [id number] ?Patient Account Number: [id number] ?Date of Birth/Sex: 09/10/1966 (56 y.o. 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 ?HBO Treatment Details ?Treatment Number: 55 ?Patient Type: Outpatient ?Chamber Type: Monoplace ?Chamber Serial #: [id number] ?Treatment Protocol: 2.0 ATA with 90 minutes oxygen, and no air breaks ?Treatment Details ?Compression Rate Down: 1.0 psi / minute ?De-Compression Rate Up: 1.5 psi / minute ?Compress Tx Pressure Air breaks and breathing periods Decompress Decompress ?Begins Reached (leave unused spaces blank) Begins Ends ?Chamber Pressure (ATA) 1 2 - - - - - - 2 1 ?Clock Time (24 hr) 10:50 11:07 - - - - - - 12:37 12:47 ?Treatment Length: 117 (minutes) ?Treatment Segments: 4 ?Vital Signs ?Capillary Blood Glucose Reference Range: 80 - 120 mg / dl ?HBO Diabetic Blood Glucose Intervention Range: <131 mg/dl or >249 mg/dl ?Time Vitals Blood Respiratory Capillary Blood Glucose Pulse Action ?Type: ?Pulse: Temperature: ?Taken: ?Pressure: ?Rate: ?Glucose (mg/dl): ?Meter #: Oximetry (%) Taken: ?Pre 10:31 136/76 66 16 98.6 247 1 none per protocol ?Post 12:51 142/70 72 16 98.7 235 1 none per protocol ?Treatment Response ?Treatment Toleration: Well ?Treatment Completion ?Treatment Completed without Adverse Event ?Status: ?HBO Attestation ?I certify that I supervised this HBO treatment in accordance with ?Medicare guidelines. A trained emergency response team is readily  Yes ?available per hospital policies and procedures. ?Continue HBOT as ordered. Yes ?Electronic Signature(s) ?Signed: 01/03/2022 5:44:47 PM By: Worthy Keeler PA-C ?Previous Signature: 01/03/2022 2:38:11 PM Version By: Enedina Finner RCP, RRT, CHT ?Entered By: Worthy Keeler on 01/03/2022 17:44:47 ?[place], [place] ([id number]) ?-------------------------------------------------------------------------------- ?HBO Safety Checklist Details ?Patient Name: [name], [name] ?Date of Service: 01/03/2022 10:30 AM ?Medical Record Number: [id number] ?Patient Account Number: [id number] ?Date of Birth/Sex: [birth year] (56 y.o. M) ?Treating RN: Levora Dredge ?Primary Care Natali Lavallee: [name] ?Other Clinician: Jacqulyn Bath ?Referring Keny Donald: [name] ?Treating Dorean Daniello/Extender: Jeri Cos ?Weeks in Treatment: 9 ?HBO Safety Checklist Items ?Safety Checklist ?Consent Form Signed ?Patient voided / foley secured and emptied ?When did you last eato 10:00 am ?Last dose of injectable or oral agent n/a ?Ostomy pouch emptied and vented if applicable ?NA ?All implantable devices assessed, documented and approved ?NA ?Intravenous access site secured and place ?NA ?Valuables secured ?Linens and cotton and cotton/polyester blend (less than 51% polyester) ?Personal oil-based products / skin lotions / body lotions removed ?Wigs or hairpieces removed ?NA ?Smoking or tobacco materials removed ?NA ?Books / newspapers / magazines / loose paper removed ?NA ?Cologne, aftershave, perfume and deodorant removed ?Jewelry removed (may wrap wedding band) ?Make-up removed ?NA ?Hair care products removed ?Battery operated devices (external) removed ?NA ?Heating patches and chemical warmers removed ?NA ?Titanium eyewear removed ?NA ?Nail polish cured greater than 10 hours ?NA ?Casting material cured greater than 10 hours ?NA ?Hearing aids removed ?NA ?Loose dentures or partials removed ?NA ?Prosthetics have been removed ?NA ?Patient  demonstrates correct use of air break device (if applicable) ?Patient concerns have been addressed ?Patient grounding bracelet on and cord attached to chamber ?Specifics for Inpatients (complete in addition to ?above) ?Medication sheet sent with patient ?Intravenous medications needed or  due during therapy sent with patient ?Drainage tubes (e.g. nasogastric tube or chest tube secured and vented) ?Endotracheal or Tracheotomy tube secured ?Cuff deflated of air and inflated with saline ?Airway suctioned ?Electronic Signature(s) ?Signed: 01/03/2022 2:38:11 PM By: Enedina Finner RCP, RRT, CHT ?Entered By: Enedina Finner on 01/03/2022 11:48:19 ?

## 2022-01-03 NOTE — Progress Notes (Addendum)
[place]umme, [place] ([id number]) ?Visit Report for 01/03/2022 ?Chief Complaint Document Details ?Patient Name: Kwolek, Rasean L. ?Date of Service: 01/03/2022 1:15 PM ?Medical Record Number: [id number] ?Patient Account Number: [id number] ?Date of Birth/Sex: [birth year] (56 y.o. M) ?Treating RN: [name] ?Primary Care Provider: [name] Other Clinician: ?Referring Provider: [name] ?Treating Provider/Extender: Jeri Cos ?Weeks in Treatment: 9 ?Information Obtained from: Patient ?Chief Complaint ?HBO evaluation for treatment on the left foot diabetic ulcer with osteomyelitis. Patient is currently under our care as well for his foot. ?Electronic Signature(s) ?Signed: 01/03/2022 1:24:45 PM By: Worthy Keeler PA-C ?Entered By: Worthy Keeler on 01/03/2022 13:24:45 ?[place], [place] ([id number]) ?-------------------------------------------------------------------------------- ?Debridement Details ?Patient Name: Rossin, Etan L. ?Date of Service: 01/03/2022 1:15 PM ?Medical Record Number: [id number] ?Patient Account Number: [id number] ?Date of Birth/Sex: [birth year] (56 y.o. M) ?Treating RN: [name] ?Primary Care Provider: [name] Other Clinician: ?Referring Provider: [name] ?Treating Provider/Extender: Jeri Cos ?Weeks in Treatment: 9 ?Debridement Performed for ?Wound #2 Left,Lateral Foot ?Assessment: ?Performed By: Physician Tommie Sams., PA-C ?Debridement Type: Debridement ?Severity of Tissue Pre Debridement: Fat layer exposed ?Level of Consciousness (Pre- ?Awake and Alert ?procedure): ?Pre-procedure Verification/Time Out ?Yes - 13:25 ?Taken: ?Start Time: 13:25 ?Total Area Debrided (L x W): 2 (cm) x 2 (cm) = 4 (cm?) ?Tissue and other material ?Viable, Non-Viable, Eschar, Subcutaneous ?debrided: ?Level: Skin/Subcutaneous Tissue ?Debridement Description: Excisional ?Instrument: Forceps, Scissors ?Bleeding: Moderate ?Hemostasis Achieved: Silver Nitrate ?End Time: 13:32 ?Procedural Pain: 0 ?Post  Procedural Pain: 0 ?Response to Treatment: Procedure was tolerated well ?Level of Consciousness (Post- ?Awake and Alert ?procedure): ?Post Debridement Measurements of Total Wound ?Length: (cm) 2 ?Width: (cm) 2 ?Depth: (cm) 0.4 ?Volume: (cm?) 1.257 ?Character of Wound/Ulcer Post Debridement: Improved ?Severity of Tissue Post Debridement: Fat layer exposed ?Post Procedure Diagnosis ?Same as Pre-procedure ?Electronic Signature(s) ?Signed: 01/03/2022 2:36:08 PM By: [name] RN ?Signed: 01/03/2022 5:45:40 PM By: Worthy Keeler PA-C ?Entered By: [name] on 01/03/2022 14:36:08 ?Foskey, [place] ([id number]) ?-------------------------------------------------------------------------------- ?HPI Details ?Patient Name: Sarkis, Krishon L. ?Date of Service: 01/03/2022 1:15 PM ?Medical Record Number: [id number] ?Patient Account Number: [id number] ?Date of Birth/Sex: [birth year] (56 y.o. M) ?Treating RN: [name] ?Primary Care Provider: [name] Other Clinician: ?Referring Provider: [name] ?Treating Provider/Extender: Jeri Cos ?Weeks in Treatment: 9 ?History of Present Illness ?HPI Description: 10/31/2021 patient presents today for evaluation here in our clinic. He is actually being evaluated for hyperbaric oxygen therapy ?only. He has been seen at [facility] up to this point he is under the care of the vascular surgery specialty. Subsequently he is also ?under the care of the hyperbaric center there. With that being said that due to the fact that he is a dialysis patient he was actually recommended ?to be transferred to [place] for his treatments he actually lives in [place] which is where his dialysis is. It was a hardship for him to try to get from ?[place] especially on dialysis days all the way to [facility] get into the facility for his treatment and get back home he was literally spending ?all day long. He has had just 4 treatments there at [facility] thus far based on what I see in these have not been  continued while he awaits approval ?here in our clinic. Subsequently I do have records for review that will be included in the HPI predetermination review and attached to this chart as ?well. I will not duplicate that here. 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 Nonetheless he continues to have exposed bone with ?marked soft tissue swelling compatible with osteomyelitis. This is due to the irregularity of the head of the metatarsal as well. Nonetheless along ?with having associated cellulitis he is also good to be seen by infectious disease and is currently on antibiotics for this as well. Again that will be ?detailed in the pretreatment review section which I will attach to this note as well. He does currently have a wound VAC in place and again the ?wound was not evaluated here in the clinic as that is being completely managed by home health and [facility]. ?The patient does have a history of diabetes mellitus type 2, hypertension, coronary artery disease, and congestive heart failure. He also has ?cataracts of both eyes but no evidence of glaucoma on his most recent eye exam. ?11/22/2021 we have been seeing this gentleman for hyperbaric oxygen therapy but have not actually evaluated his wound up to this point this was ?being managed by the wound care and vascular team I presumed at [facility] at least that is what has been told to be previous. Nonetheless at this ?point I got a call from Atlee Abide who is a Librarian, academic at the [facility] vascular clinic with Dr. Durene Fruits and subsequently they were wanting ?to know if I could take over wound care for this patient. Apparently they do not have the ability for an office debridement which again I completely ?understand but nonetheless definitely is not an integral part of his healing along with the hyperbarics.  Subsequently I discussed with the patient ?today that I do believe he would benefit going ahead and proceeding with evaluation here in the clinic for that reason I did go ahead and see him ?today to get things started. There is also been some confusion about the issues with his home health nurse and getting measurements to [facility] for ?the wound VAC in order to continue his wound VAC therapy. With that being said after I see him today we will make a determination on what to ?do with wound VAC we can definitely send measurements to [facility] but again the bigger question is whether this is the best way to go or not. ?11/28/2021 upon evaluation today patient appears to be doing decently well in regard to his wound. He has been tolerating the dressing changes ?with the Dakin's moistened gauze dressing which I think is doing a good job. Fortunately I do not see any signs of active infection locally nor ?systemically at this point. In general I think that he is actually making excellent progress which is great news. ?2/17; seen in conjunction with HBO today. 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 Much improved condition ?of the wound surface. On the lateral foot and eschared area that he has been using Betadine. Tolerating HBO well ?12/13/2021 upon evaluation today patient appears to be doing pretty well in regard to his foot ulcer. I am actually much more pleased currently ?that I been with the way the appearance looks today. The wound bed does show some signs of slough and biofilm on the surface of the wound ?this is minimal I Minna perform debridement the I will do so very carefully due to the fact that to be honest the patient had a significant amount of ?bleeding in the past being  on the [place] he had a difficult time clotting when I debrided him previously quite significantly. Fortunately I do not ?see any signs of active infection at this time which is great news. No fevers, chills,  nausea, vomiting, or diarrhea. ?12/20/2021 upon evaluation today patient actually appears to be showing some signs of excellent improvement here. I am very pleased with where ?things stand currently. There does not

## 2022-01-06 ENCOUNTER — Encounter: Payer: Medicare Other | Admitting: Physician Assistant

## 2022-01-06 ENCOUNTER — Other Ambulatory Visit: Payer: Self-pay

## 2022-01-06 DIAGNOSIS — Z992 Dependence on renal dialysis: Secondary | ICD-10-CM | POA: Diagnosis not present

## 2022-01-06 DIAGNOSIS — L97524 Non-pressure chronic ulcer of other part of left foot with necrosis of bone: Secondary | ICD-10-CM | POA: Diagnosis not present

## 2022-01-06 DIAGNOSIS — I110 Hypertensive heart disease with heart failure: Secondary | ICD-10-CM | POA: Diagnosis not present

## 2022-01-06 DIAGNOSIS — D631 Anemia in chronic kidney disease: Secondary | ICD-10-CM | POA: Diagnosis not present

## 2022-01-06 DIAGNOSIS — I5042 Chronic combined systolic (congestive) and diastolic (congestive) heart failure: Secondary | ICD-10-CM | POA: Diagnosis not present

## 2022-01-06 DIAGNOSIS — I2510 Atherosclerotic heart disease of native coronary artery without angina pectoris: Secondary | ICD-10-CM | POA: Diagnosis not present

## 2022-01-06 DIAGNOSIS — N2581 Secondary hyperparathyroidism of renal origin: Secondary | ICD-10-CM | POA: Diagnosis not present

## 2022-01-06 DIAGNOSIS — M86672 Other chronic osteomyelitis, left ankle and foot: Secondary | ICD-10-CM | POA: Diagnosis not present

## 2022-01-06 DIAGNOSIS — E11621 Type 2 diabetes mellitus with foot ulcer: Secondary | ICD-10-CM | POA: Diagnosis not present

## 2022-01-06 DIAGNOSIS — N186 End stage renal disease: Secondary | ICD-10-CM | POA: Diagnosis not present

## 2022-01-06 LAB — GLUCOSE, CAPILLARY
Glucose-Capillary: 213 mg/dL — ABNORMAL HIGH (ref 70–99)
Glucose-Capillary: 145 mg/dL — ABNORMAL HIGH (ref 70–99)

## 2022-01-06 NOTE — Progress Notes (Signed)
[place], [place] ([id number]) ?Visit Report for 01/06/2022 ?Arrival Information Details ?Patient Name: [name], [name] ?Date of Service: 01/06/2022 1:15 PM ?Medical Record Number: [id number] ?Patient Account Number: [id number] ?Date of Birth/Sex: 09-13-1966 (56 y.o. M) ?Treating RN: [name] ?Primary Care Drisana Schweickert: [name] Other Clinician: ?Referring Savannha Welle: [name] ?Treating Randon Somera/Extender: Jeri Cos ?Weeks in Treatment: 9 ?Visit Information History Since Last Visit ?All ordered tests and consults were completed: No ?Patient Arrived: Ambulatory ?Added or deleted any medications: No ?Arrival Time: 13:08 ?Any new allergies or adverse reactions: No ?Accompanied By: self ?Had a fall or experienced change in No ?Transfer Assistance: None ?activities of daily living that may affect ?Patient Identification Verified: Yes ?risk of falls: ?Secondary Verification Process Completed: Yes ?Signs or symptoms of abuse/neglect since last visito No ?Patient Requires Transmission-Based No ?Hospitalized since last visit: No ?Precautions: ?Implantable device outside of the clinic excluding No ?Patient Has Alerts: Yes ?cellular tissue based products placed in the center ?Patient Alerts: Patient on Blood ?since last visit: ?Thinner ?Has Dressing in Place as Prescribed: Yes ?DIALYSIS RIGHT ARM ?Pain Present Now: No ?Aspirin 81/Brilinta ?Electronic Signature(s) ?Signed: 01/06/2022 2:47:27 PM By: [name] RN ?Entered By: [name] on 01/06/2022 13:28:50 ?[name], [name] ([id number]) ?-------------------------------------------------------------------------------- ?Clinic Level of Care Assessment Details ?Patient Name: [name], [name] ?Date of Service: 01/06/2022 1:15 PM ?Medical Record Number: [id number] ?Patient Account Number: [id number] ?Date of Birth/Sex: 1966/09/07 (56 y.o. M) ?Treating RN: [name] ?Primary Care Nazarene Bunning: [name] Other Clinician: ?Referring Cortnee Steinmiller: [name] ?Treating  Ileana Chalupa/Extender: Jeri Cos ?Weeks in Treatment: 9 ?Clinic Level of Care Assessment Items ?TOOL 4 Quantity Score ?X - Use when only an EandM is performed on FOLLOW-UP visit 1 0 ?ASSESSMENTS - Nursing Assessment / Reassessment ?X - Reassessment of Co-morbidities (includes updates in patient status) 1 10 ?X- 1 5 ?Reassessment of Adherence to Treatment Plan ?ASSESSMENTS - Wound and Skin Assessment / Reassessment ?[]  - Simple Wound Assessment / Reassessment - one wound 0 ?X- 2 5 ?Complex Wound Assessment / Reassessment - multiple wounds ?[]  - 0 ?Dermatologic / Skin Assessment (not related to wound area) ?ASSESSMENTS - Focused Assessment ?[]  - Circumferential Edema Measurements - multi extremities 0 ?[]  - 0 ?Nutritional Assessment / Counseling / Intervention ?[]  - 0 ?Lower Extremity Assessment (monofilament, tuning fork, pulses) ?[]  - 0 ?Peripheral Arterial Disease Assessment (using hand held doppler) ?ASSESSMENTS - Ostomy and/or Continence Assessment and Care ?[]  - Incontinence Assessment and Management 0 ?[]  - 0 ?Ostomy Care Assessment and Management (repouching, etc.) ?PROCESS - Coordination of Care ?X - Simple Patient / Family Education for ongoing care 1 15 ?[]  - 0 ?Complex (extensive) Patient / Family Education for ongoing care ?[]  - 0 ?Staff obtains Consents, Records, Test Results / Process Orders ?[]  - 0 ?Staff telephones HHA, Nursing Homes / Clarify orders / etc ?[]  - 0 ?Routine Transfer to another Facility (non-emergent condition) ?[]  - 0 ?Routine Hospital Admission (non-emergent condition) ?[]  - 0 ?New Admissions / Biomedical engineer / Ordering NPWT, Apligraf, etc. ?[]  - 0 ?Emergency Hospital Admission (emergent condition) ?X- 1 10 ?Simple Discharge Coordination ?[]  - 0 ?Complex (extensive) Discharge Coordination ?PROCESS - Special Needs ?[]  - Pediatric / Minor Patient Management 0 ?[]  - 0 ?Isolation Patient Management ?[]  - 0 ?Hearing / Language / Visual special needs ?[]  - 0 ?Assessment of Community  assistance (transportation, D/C planning, etc.) ?[]  - 0 ?Additional assistance / Altered mentation ?[]  - 0 ?Support Surface(s) Assessment (bed, cushion, seat, etc.) ?INTERVENTIONS - Wound Cleansing / Measurement ?[name],  [name] ([id number]) ?[]  - 0 ?Simple Wound Cleansing - one wound ?X- 2 5 ?Complex Wound Cleansing - multiple wounds ?[]  - 0 ?Wound Imaging (photographs - any number of wounds) ?[]  - 0 ?Wound Tracing (instead of photographs) ?[]  - 0 ?Simple Wound Measurement - one wound ?X- 2 5 ?Complex Wound Measurement - multiple wounds ?INTERVENTIONS - Wound Dressings ?X - Small Wound Dressing one or multiple wounds 2 10 ?[]  - 0 ?Medium Wound Dressing one or multiple wounds ?[]  - 0 ?Large Wound Dressing one or multiple wounds ?[]  - 0 ?Application of Medications - topical ?[]  - 0 ?Application of Medications - injection ?INTERVENTIONS - Miscellaneous ?[]  - External ear exam 0 ?[]  - 0 ?Specimen Collection (cultures, biopsies, blood, body fluids, etc.) ?[]  - 0 ?Specimen(s) / Culture(s) sent or taken to Lab for analysis ?[]  - 0 ?Patient Transfer (multiple staff / Civil Service fast streamer / Similar devices) ?[]  - 0 ?Simple Staple / Suture removal (25 or less) ?[]  - 0 ?Complex Staple / Suture removal (26 or more) ?[]  - 0 ?Hypo / Hyperglycemic Management (close monitor of Blood Glucose) ?[]  - 0 ?Ankle / Brachial Index (ABI) - do not check if billed separately ?[]  - 0 ?Vital Signs ?Has the patient been seen at the hospital within the last three years: Yes ?Total Score: 90 ?Level Of Care: New/Established - ?Level 3 ?Electronic Signature(s) ?Signed: 01/06/2022 2:47:27 PM By: [name] RN ?Entered By: [name] on 01/06/2022 13:32:05 ?[name], [name] ([id number]) ?-------------------------------------------------------------------------------- ?Encounter Discharge Information Details ?Patient Name: [name], [name] ?Date of Service: 01/06/2022 1:15 PM ?Medical Record Number: [id number] ?Patient Account Number: [id number] ?Date of Birth/Sex:  March 31, 1966 (56 y.o. M) ?Treating RN: [name] ?Primary Care Ishani Goldwasser: [name] Other Clinician: ?Referring Giuseppina Quinones: [name] ?Treating Shifra Swartzentruber/Extender: Jeri Cos ?Weeks in Treatment: 9 ?Encounter Discharge Information Items ?Discharge Condition: Stable ?Ambulatory Status: Ambulatory ?Discharge Destination: Home ?Transportation: Private Auto ?Accompanied By: self ?Schedule Follow-up Appointment: Yes ?Clinical Summary of Care: Patient Declined ?Electronic Signature(s) ?Signed: 01/06/2022 2:47:27 PM By: [name] RN ?Entered By: [name] on 01/06/2022 13:31:32 ?[name], [name] ([id number]) ?-------------------------------------------------------------------------------- ?Wound Assessment Details ?Patient Name: [name], [name] ?Date of Service: 01/06/2022 1:15 PM ?Medical Record Number: [id number] ?Patient Account Number: [id number] ?Date of Birth/Sex: Feb 06, 1966 (56 y.o. M) ?Treating RN: [name] ?Primary Care Branko Steeves: [name] Other Clinician: ?Referring Florabel Faulks: [name] ?Treating Fintan Grater/Extender: Jeri Cos ?Weeks in Treatment: 9 ?Wound Status ?Wound Number: 1 Primary Open Surgical Wound ?Etiology: ?Wound Location: Left Amputation Site - Digit ?Wound Open ?Wounding Event: Surgical Injury ?Status: ?Date Acquired: 09/09/2021 ?Comorbid Cataracts, Anemia, Sleep Apnea, Arrhythmia, Congestive ?Weeks Of Treatment: 6 ?History: Heart Failure, Coronary Artery Disease, Hypertension, ?Clustered Wound: No ?Myocardial Infarction, Type II Diabetes, Neuropathy, ?Seizure Disorder ?Wound Measurements ?Length: (cm) 3 ?Width: (cm) 2 ?Depth: (cm) 1.8 ?Area: (cm?) 4.712 ?Volume: (cm?) 8.482 ?% Reduction in Area: 66.7% ?% Reduction in Volume: 79.3% ?Epithelialization: None ?Tunneling: No ?Undermining: No ?Wound Description ?Classification: Full Thickness With Exposed Support Structures ?Exudate Amount: Medium ?Exudate Type: Serosanguineous ?Exudate Color: red, brown ?Foul Odor After Cleansing:  No ?Slough/Fibrino Yes ?Wound Bed ?Granulation Amount: Medium (34-66%) Exposed Structure ?Granulation Quality: Red ?Fascia Exposed: Yes ?Necrotic Amount: Medium (34-66%) ?Fat Layer (Subcutaneous Tissue) Exposed: Yes ?N

## 2022-01-06 NOTE — Progress Notes (Signed)
Guardiola, [place] ([id number]) ?Visit Report for 01/06/2022 ?Physician Orders Details ?Patient Name: [name], [name] ?Date of Service: 01/06/2022 1:15 PM ?Medical Record Number: [id number] ?Patient Account Number: [id number] ?Date of Birth/Sex: Feb 23, 1966 (56 y.o. M) ?Treating RN: [name] ?Primary Care Provider: [name] Other Clinician: ?Referring Provider: [name] ?Treating Provider/Extender: Jeri Cos ?Weeks in Treatment: 9 ?Verbal / Phone Orders: No ?Diagnosis Coding ?Follow-up Appointments ?o Return Appointment in 1 week. - provider visits at wound center ?o Return Appointment in: - HBO- (daily Monday -Friday for HBO Treatments) ?Home Health ?[place] ?o [place] for wound care. May utilize formulary equivalent dressing for wound treatment orders unless ?otherwise specified. Home Health Nurse may visit PRN to address patientos wound care needs. ?o **Please direct any NON-WOUND related issues/requests for orders to patient's Primary Care Physician. **If current ?dressing causes regression in wound condition, may D/C ordered dressing product/s and apply Normal Saline Moist ?Dressing daily until next [place] or Other MD appointment. **Notify [facility] of regression in ?wound condition at [phone]. ?o Other Home Health Orders/Instructions: - HOLD WOUND VAC AT THIS TIME ?Bathing/ Shower/ Hygiene ?o Clean wound with Normal Saline or wound cleanser. ?o May shower with wound dressing protected with water repellent cover or cast protector. ?o No tub bath. 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 ?o Follow Hyperbaric Oxygen Glycemia Protocol ?Wound Treatment ?Wound #1 - Amputation Site - Digit Wound Laterality: Left ?Cleanser: Normal Saline 1 x Per Day/30 Days ?Discharge Instructions: Wash your hands with soap and water. Remove old dressing, discard into plastic bag and place into trash. ?Cleanse the wound with Normal Saline prior to applying a clean dressing using gauze sponges, not tissues or cotton balls. Do not scrub ?or use excessive force. Pat dry using gauze sponges, not tissue or cotton balls. ?Cleanser: Vashe 5.8 (oz) 1 x Per Day/30 Days ?Discharge Instructions: Use vashe 5.8 (oz) as directed ?Cleanser: Wound Cleanser 1 x Per Day/30 Days ?Discharge Instructions: Wash your hands with soap and water. Remove old dressing, discard into plastic bag and place into trash. ?Cleanse the wound with Wound Cleanser prior to applying a clean dressing using gauze sponges, not tissues or cotton balls. Do not ?scrub or use excessive force. Pat dry using gauze sponges, not tissue or cotton balls. ?Primary Dressing: Gauze 1 x Per Day/30 Days ?Discharge Instructions: VASHE/DAKINS-As directed: dry, moistened with saline or moistened with Dakins Solution ?Secondary Dressing: ABD Pad 5x9 (in/in) 1 x Per Day/30 Days ?Discharge Instructions: Cover with ABD pad ?Secured With: Medipore Tape - 41M Medipore H Soft Cloth Surgical Tape, 2x2 (in/yd) 1 x Per Day/30 Days ?[place], [place] ([id number]) ?Secured With: [employer] or Non-Sterile 6-ply 4.5x4 (yd/yd) 1 x Per Day/30 Days ?Discharge Instructions: Apply Kerlix as directed ?Wound #2 - Foot Wound Laterality: Left, Lateral ?Primary Dressing: Gauze 1 x Per Day/30 Days ?Discharge Instructions: moistened with Dakins  Solution or Vashe ?Secondary  Dressing: ABD Pad 5x9 (in/in) 1 x Per Day/30 Days ?Discharge Instructions: Cover with ABD pad ?Secondary Dressing: Kerlix 4.5 x 4.1 (in/yd) 1 x Per Day/30 Days ?Discharge Instructions: lightly ?Secured With: Medipore Tape - 72M Medipore H Soft Cloth Surgical Tape, 2x2 (in/yd) 1 x Per Day/30 Days ?GLYCEMIA INTERVENTIONS PROTOCOL ?PRE-HBO GLYCEMIA INTERVENTIONS ?ACTION INTERVENTION ?Obtain pre-HBO capillary blood glucose (ensure ?1 ?physician order is in chart). ?A. Notify HBO physician and await physician ?orders. ?2 If result is 70 mg/dl or below: ?B. If the result meets the hospital definition ?of a critical result, follow hospital policy. ?A. Give patient an 8 ounce Glucerna Shake??, ?an 8 ounce Ensure??, or 8 ounces of a ?Glucerna/Ensure equivalent dietary ?supplement*. ?B. Wait 30 minutes. ?If result is 71 mg/dl to 130 mg/dl: C. Retest patientos capillary blood glucose ?(CBG). ?D. If result greater than or equal to 110 ?mg/dl, proceed with HBO. ?If result less than 110 mg/dl, notify HBO ?physician and consider holding HBO. ?If result is 131 mg/dl to 249 mg/dl: A. Proceed with HBO. ?A. Notify HBO physician and await physician ?orders. ?B. It is recommended to hold HBO and do ?If result is 250 mg/dl or greater: ?blood/urine ketone testing. ?C. If the result meets the hospital definition ?of a critical result, follow hospital policy. ?POST-HBO GLYCEMIA INTERVENTIONS ?ACTION INTERVENTION ?Obtain post HBO capillary blood glucose (ensure ?1 ?physician order is in chart). ?A. Notify HBO physician and await physician ?orders. ?2 If result is 70 mg/dl or below: ?B. If the result meets the hospital definition ?of a critical result, follow hospital policy. ?A. Give patient an 8 ounce Glucerna Shake??, ?an 8 ounce Ensure??, or 8 ounces of a ?Glucerna/Ensure equivalent dietary ?supplement*. ?B. Wait 15 minutes for symptoms of ?hypoglycemia (i.e. nervousness, anxiety, ?If result is 71 mg/dl to 100  mg/dl: sweating, chills, clamminess, irritability, ?confusion, tachycardia or dizziness). ?C. If patient asymptomatic, discharge ?patient. ?If patient symptomatic, repeat capillary ?blood glucose (CBG) and notify HBO ?physician. ?If result is 101 mg/dl to 249 mg/dl: A. Discharge patient. ?A. Notify HBO physician and await physician ?orders. ?B. It is recommended to do blood/urine ?If result is 250 mg/dl or greater: ?ketone testing. ?C. If the result meets the hospital definition ?of a critical result, follow hospital policy. ?Krichbaum, [place] ([id number]) ?*Juice or candies are NOT equivalent products. If patient refuses the Glucerna or Ensure, please consult the ?hospital dietitian for an appropriate substitute. ?Electronic Signature(s) ?Signed: 01/06/2022 2:47:27 PM By: [name] RN ?Signed: 01/06/2022 4:18:52 PM By: Worthy Keeler PA-C ?Entered By: [name] on 01/06/2022 13:31:01 ?Bayron, [place] ([id number]) ?-------------------------------------------------------------------------------- ?SuperBill Details ?Patient Name: Brownley, Spenser L. ?Date of Service: 01/06/2022 ?Medical Record Number: [id number] ?Patient Account Number: [id number] ?Date of Birth/Sex: 05-26-1966 (56 y.o. M) ?Treating RN: [name] ?Primary Care Provider: [name] Other Clinician: ?Referring Provider: [name] ?Treating Provider/Extender: Jeri Cos ?Weeks in Treatment: 9 ?Diagnosis Coding ?ICD-10 Codes ?Code Description ?E11.621 Type 2 diabetes mellitus with foot ulcer ?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e ?A19.379 Other chronic osteomyelitis, left ankle and foot ?I10 Essential (primary) hypertension ?I25.10 Atherosclerotic heart disease of native coronary artery without angina pectoris ?I50.42 Chronic combined systolic (congestive) and diastolic (congestive) heart failure ?Facility Procedures ?CPT4 Code: 02409735 ?Description: 51 - WOUND CARE VISIT-LEV 3 EST  PT ?Modifier: ?Quantity: 1 ?Electronic Signature(s) ?Signed: 01/06/2022 2:47:27 PM By: [name] RN ?Signed: 01/06/2022 4:18:52 PM By: Worthy Keeler PA-C ?Entered By: [name] on 01/06/2022 13:32:11 ?

## 2022-01-06 NOTE — Progress Notes (Signed)
Wray, Paisley ([id number]) ?Visit Report for 01/06/2022 ?Arrival Information Details ?Patient Name: [name], [name] ?Date of Service: 01/06/2022 10:30 AM ?Medical Record Number: [id number] ?Patient Account Number: [id number] ?Date of Birth/Sex: 03-Apr-1966 (56 y.o. M) ?Treating RN: Levora Dredge ?Primary Care Sharod Petsch: [name] ?Other Clinician: Jacqulyn Bath ?Referring Keil Pickering: [name] ?Treating Darshawn Boateng/Extender: Jeri Cos ?Weeks in Treatment: 9 ?Visit Information History Since Last Visit ?Added or deleted any medications: No ?Patient Arrived: Ambulatory ?Any new allergies or adverse reactions: No ?Arrival Time: 10:00 ?Had a fall or experienced change in No ?Accompanied By: self ?activities of daily living that may affect ?Transfer Assistance: None ?risk of falls: ?Patient Identification Verified: Yes ?Signs or symptoms of abuse/neglect since last visito No ?Secondary Verification Process Completed: Yes ?Hospitalized since last visit: No ?Patient Requires Transmission-Based No ?Implantable device outside of the clinic excluding No ?Precautions: ?cellular tissue based products placed in the center ?Patient Has Alerts: Yes ?since last visit: ?Patient Alerts: Patient on Blood ?Pain Present Now: No ?Thinner ?DIALYSIS RIGHT ARM ?Aspirin 81/Brilinta ?Electronic Signature(s) ?Signed: 01/06/2022 2:32:05 PM By: Enedina Finner RCP, RRT, CHT ?Entered By: Enedina Finner on 01/06/2022 11:26:05 ?[name], [name] ([id number]) ?-------------------------------------------------------------------------------- ?Encounter Discharge Information Details ?Patient Name: [name], [name] ?Date of Service: 01/06/2022 10:30 AM ?Medical Record Number: [id number] ?Patient Account Number: [id number] ?Date of Birth/Sex: 03-07-1966 (56 y.o. M) ?Treating RN: Levora Dredge ?Primary Care Pollie Poma: [name] ?Other Clinician: Jacqulyn Bath ?Referring Shanica Castellanos: [name] ?Treating Camela Wich/Extender: Jeri Cos ?Weeks in Treatment: 9 ?Encounter Discharge Information Items ?Discharge Condition: Stable ?Ambulatory Status: Ambulatory ?Discharge Destination: Home ?Transportation: Private Auto ?Accompanied By: self ?Schedule Follow-up Appointment: Yes ?Clinical Summary of Care: ?Notes ?Patient has an HBO treatment scheduled on 01/07/22 at 08:00 am. ?Electronic Signature(s) ?Signed: 01/06/2022 2:32:05 PM By: Enedina Finner RCP, RRT, CHT ?Entered By: Enedina Finner on 01/06/2022 13:04:30 ?[name], [name] ([id number]) ?-------------------------------------------------------------------------------- ?Vitals Details ?Patient Name: [name], [name] ?Date of Service: 01/06/2022 10:30 AM ?Medical Record Number: [id number] ?Patient Account Number: [id number] ?Date of Birth/Sex: 1965/11/13 (56 y.o. M) ?Treating RN: Levora Dredge ?Primary Care Ladainian Therien: [name] ?Other Clinician: Jacqulyn Bath ?Referring Nichlas Pitera: [name] ?Treating Dezi Schaner/Extender: Jeri Cos ?Weeks in Treatment: 9 ?Vital Signs ?Time Taken: 10:36 ?Temperature (??F): 98.3 ?Height (in): 70 ?Pulse (bpm): 66 ?Weight (lbs): 180.4 ?Respiratory Rate (breaths/min): 16 ?Body Mass Index (BMI): 25.9 ?Blood Pressure (mmHg): 138/72 ?Capillary Blood Glucose (mg/dl): 213 ?Reference Range: 80 - 120 mg / dl ?Electronic Signature(s) ?Signed: 01/06/2022 2:32:05 PM By: Enedina Finner RCP, RRT, CHT ?Entered By: Enedina Finner on 01/06/2022 11:26:42 ?

## 2022-01-06 NOTE — Progress Notes (Signed)
Mckim, Ferron ([id number]) ?Visit Report for 01/06/2022 ?HBO Details ?Patient Name: [name], [name]. ?Date of Service: 01/06/2022 10:30 AM ?Medical Record Number: [id number] ?Patient Account Number: [id number] ?Date of Birth/Sex: 22-Mar-1966 (56 y.o. 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 ?HBO Treatment Details ?Treatment Number: 40 ?Patient Type: Outpatient ?Chamber Type: Monoplace ?Chamber Serial #: [id number] ?Treatment Protocol: 2.0 ATA with 90 minutes oxygen, and no air breaks ?Treatment Details ?Compression Rate Down: 1.0 psi / minute ?De-Compression Rate Up: 1.5 psi / minute ?Compress Tx Pressure Air breaks and breathing periods Decompress Decompress ?Begins Reached (leave unused spaces blank) Begins Ends ?Chamber Pressure (ATA) 1 2 - - - - - - 2 1 ?Clock Time (24 hr) 10:46 11:03 - - - - - - 12:34 12:44 ?Treatment Length: 118 (minutes) ?Treatment Segments: 4 ?Vital Signs ?Capillary Blood Glucose Reference Range: 80 - 120 mg / dl ?HBO Diabetic Blood Glucose Intervention Range: <131 mg/dl or >249 mg/dl ?Time Vitals Blood Respiratory Capillary Blood Glucose Pulse Action ?Type: ?Pulse: Temperature: ?Taken: ?Pressure: ?Rate: ?Glucose (mg/dl): ?Meter #: Oximetry (%) Taken: ?Pre 10:36 138/72 66 16 98.3 213 1 none per protocol ?Post 12:47 132/76 72 16 98.5 145 1 none per protocol ?Treatment Response ?Treatment Toleration: Well ?Treatment Completion ?Treatment Completed without Adverse Event ?Status: ?Electronic Signature(s) ?Signed: 01/06/2022 2:32:05 PM By: Enedina Finner RCP, RRT, CHT ?Signed: 01/06/2022 4:18:52 PM By: Worthy Keeler  PA-C ?Entered By: Enedina Finner on 01/06/2022 13:02:54 ?Lorenzen, [place] ([id number]) ?-------------------------------------------------------------------------------- ?HBO Safety Checklist Details ?Patient Name: [name]. ?Date of Service: 01/06/2022 10:30 AM ?Medical Record Number: [id number] ?Patient Account Number: [id number] ?Date of Birth/Sex: December 12, 1965 (56 y.o. M) ?Treating RN: Levora Dredge ?Primary Care Berlynn Warsame: [name] ?Other Clinician: Jacqulyn Bath ?Referring Kaytee Taliercio: [name] ?Treating Anaria Kroner/Extender: Jeri Cos ?Weeks in Treatment: 9 ?HBO Safety Checklist Items ?Safety Checklist ?Consent Form Signed ?Patient voided / foley secured and emptied ?When did you last eato 09:30 am ?Last dose of injectable or oral agent n/a ?Ostomy pouch emptied and vented if applicable ?NA ?All implantable devices assessed, documented and approved ?NA ?Intravenous access site secured and place ?NA ?Valuables secured ?Linens and cotton and cotton/polyester blend (less than 51% polyester) ?Personal oil-based products / skin lotions / body lotions removed ?Wigs or hairpieces removed ?NA ?Smoking or tobacco materials removed ?NA ?Books / newspapers / magazines / loose paper removed ?NA ?Cologne, aftershave, perfume and deodorant removed ?Jewelry removed (may wrap wedding band) ?Make-up removed ?NA ?Hair care products removed ?Battery operated devices (external) removed ?NA ?Heating patches and chemical warmers removed ?NA ?Titanium eyewear removed ?NA ?Nail polish cured greater than 10 hours ?NA ?Casting material cured greater than 10 hours ?NA ?Hearing aids removed ?NA ?Loose dentures or partials removed ?NA ?Prosthetics have been removed ?NA ?Patient demonstrates correct use of air break device (if applicable) ?Patient concerns have been addressed ?Patient grounding bracelet on and cord attached to chamber ?Specifics for Inpatients (complete in addition to ?above) ?Medication sheet sent with  patient ?Intravenous medications needed or due during therapy sent with patient ?Drainage tubes (e.g. nasogastric tube or chest tube secured and vented) ?Endotracheal or Tracheotomy tube secured ?Cuff deflated of air and inflated with saline ?Airway suctioned ?Electronic Signature(s) ?Signed: 01/06/2022  2:32:05 PM By: Enedina Finner RCP, RRT, CHT ?Entered By: Enedina Finner on 01/06/2022 11:28:07 ?

## 2022-01-07 ENCOUNTER — Encounter: Payer: Medicare Other | Admitting: Physician Assistant

## 2022-01-07 DIAGNOSIS — D631 Anemia in chronic kidney disease: Secondary | ICD-10-CM | POA: Diagnosis not present

## 2022-01-07 DIAGNOSIS — I110 Hypertensive heart disease with heart failure: Secondary | ICD-10-CM | POA: Diagnosis not present

## 2022-01-07 DIAGNOSIS — E1122 Type 2 diabetes mellitus with diabetic chronic kidney disease: Secondary | ICD-10-CM | POA: Diagnosis not present

## 2022-01-07 DIAGNOSIS — N186 End stage renal disease: Secondary | ICD-10-CM | POA: Diagnosis not present

## 2022-01-07 DIAGNOSIS — E1151 Type 2 diabetes mellitus with diabetic peripheral angiopathy without gangrene: Secondary | ICD-10-CM | POA: Diagnosis not present

## 2022-01-07 DIAGNOSIS — Z4781 Encounter for orthopedic aftercare following surgical amputation: Secondary | ICD-10-CM | POA: Diagnosis not present

## 2022-01-07 DIAGNOSIS — I2510 Atherosclerotic heart disease of native coronary artery without angina pectoris: Secondary | ICD-10-CM | POA: Diagnosis not present

## 2022-01-07 DIAGNOSIS — L97524 Non-pressure chronic ulcer of other part of left foot with necrosis of bone: Secondary | ICD-10-CM | POA: Diagnosis not present

## 2022-01-07 DIAGNOSIS — I120 Hypertensive chronic kidney disease with stage 5 chronic kidney disease or end stage renal disease: Secondary | ICD-10-CM | POA: Diagnosis not present

## 2022-01-07 DIAGNOSIS — M86672 Other chronic osteomyelitis, left ankle and foot: Secondary | ICD-10-CM | POA: Diagnosis not present

## 2022-01-07 DIAGNOSIS — I5042 Chronic combined systolic (congestive) and diastolic (congestive) heart failure: Secondary | ICD-10-CM | POA: Diagnosis not present

## 2022-01-07 DIAGNOSIS — E11621 Type 2 diabetes mellitus with foot ulcer: Secondary | ICD-10-CM | POA: Diagnosis not present

## 2022-01-07 LAB — GLUCOSE, CAPILLARY
Glucose-Capillary: 107 mg/dL — ABNORMAL HIGH (ref 70–99)
Glucose-Capillary: 167 mg/dL — ABNORMAL HIGH (ref 70–99)
Glucose-Capillary: 191 mg/dL — ABNORMAL HIGH (ref 70–99)

## 2022-01-07 NOTE — Progress Notes (Signed)
Ruhlman, [place] ([id number]) ?Visit Report for 01/07/2022 ?Arrival Information Details ?Patient Name: [name], [name] ?Date of Service: 01/07/2022 8:00 AM ?Medical Record Number: [id number] ?Patient Account Number: [id number] ?Date of Birth/Sex: Aug 31, 1966 (56 y.o. M) ?Treating RN: Levora Dredge ?Primary Care Su Duma: [name] ?Other Clinician: Jacqulyn Bath ?Referring Mishawn Didion: [name] ?Treating Tynika Luddy/Extender: Jeri Cos ?Weeks in Treatment: 9 ?Visit Information History Since Last Visit ?Added or deleted any medications: No ?Patient Arrived: Ambulatory ?Any new allergies or adverse reactions: No ?Arrival Time: 08:15 ?Had a fall or experienced change in No ?Accompanied By: self ?activities of daily living that may affect ?Transfer Assistance: None ?risk of falls: ?Patient Identification Verified: Yes ?Signs or symptoms of abuse/neglect since last visito No ?Secondary Verification Process Completed: Yes ?Hospitalized since last visit: No ?Patient Requires Transmission-Based No ?Implantable device outside of the clinic excluding No ?Precautions: ?cellular tissue based products placed in the center ?Patient Has Alerts: Yes ?since last visit: ?Patient Alerts: Patient on Blood ?Has Dressing in Place as Prescribed: Yes ?Thinner ?Has Footwear/Offloading in Place as Prescribed: Yes ?DIALYSIS RIGHT ARM ?Left: Surgical Shoe with Pressure ?Aspirin 81/Brilinta ?Relief Insole ?Pain Present Now: No ?Electronic Signature(s) ?Signed: 01/07/2022 1:32:46 PM By: Enedina Finner RCP, RRT, CHT ?Entered By: Enedina Finner on 01/07/2022 09:07:15 ?Jaworski, [place] ([id number]) ?-------------------------------------------------------------------------------- ?Encounter Discharge Information Details ?Patient Name: [name], [name] ?Date of Service: 01/07/2022 8:00 AM ?Medical Record Number: [id number] ?Patient Account Number: [id number] ?Date of Birth/Sex: [birth year] (56 y.o. M) ?Treating RN: Levora Dredge ?Primary Care Rilie Glanz: [name] ?Other Clinician: Jacqulyn Bath ?Referring Leonard Feigel: [name] ?Treating Noreen Mackintosh/Extender: Jeri Cos ?Weeks in Treatment: 9 ?Encounter Discharge Information Items ?Discharge Condition: Stable ?Ambulatory Status: Ambulatory ?Discharge Destination: Home ?Transportation: Private Auto ?Accompanied By: self ?Schedule Follow-up Appointment: Yes ?Clinical Summary of Care: ?Notes ?Patient has an HBO treatment scheduled on 01/08/22. ?Electronic Signature(s) ?Signed: 01/07/2022 1:32:46 PM By: Enedina Finner RCP, RRT, CHT ?Entered By: Enedina Finner on 01/07/2022 12:01:28 ?[place]y, [place] ([id number]) ?-------------------------------------------------------------------------------- ?Vitals Details ?Patient Name: [name], [name] ?Date of Service: 01/07/2022 8:00 AM ?Medical Record Number: [id number] ?Patient Account Number: [id number] ?Date of Birth/Sex: 04-Dec-1965 (56 y.o. M) ?Treating RN: Levora Dredge ?Primary Care Romel Dumond: [name] ?Other Clinician: Jacqulyn Bath ?Referring Sabrine Patchen: [name] ?Treating Lamaya Hyneman/Extender: Jeri Cos ?Weeks in Treatment: 9 ?Vital Signs ?Time Taken: 08:17 ?Temperature (??F): 98.0 ?Height (in): 70 ?Pulse (bpm): 60 ?Weight (lbs): 180.4 ?Respiratory Rate (breaths/min): 16 ?Body Mass Index (BMI): 25.9 ?Blood Pressure (mmHg): 122/68 ?Capillary Blood Glucose (mg/dl): 107 ?Reference Range: 80 - 120 mg / dl ?Electronic Signature(s) ?Signed: 01/07/2022 1:32:46 PM By: Enedina Finner RCP, RRT, CHT ?Entered By: Enedina Finner on 01/07/2022 09:08:30 ?

## 2022-01-08 ENCOUNTER — Other Ambulatory Visit: Payer: Self-pay

## 2022-01-08 ENCOUNTER — Encounter (HOSPITAL_BASED_OUTPATIENT_CLINIC_OR_DEPARTMENT_OTHER): Payer: Medicare Other | Admitting: Internal Medicine

## 2022-01-08 DIAGNOSIS — D631 Anemia in chronic kidney disease: Secondary | ICD-10-CM | POA: Diagnosis not present

## 2022-01-08 DIAGNOSIS — N2581 Secondary hyperparathyroidism of renal origin: Secondary | ICD-10-CM | POA: Diagnosis not present

## 2022-01-08 DIAGNOSIS — M86672 Other chronic osteomyelitis, left ankle and foot: Secondary | ICD-10-CM

## 2022-01-08 DIAGNOSIS — E11621 Type 2 diabetes mellitus with foot ulcer: Secondary | ICD-10-CM | POA: Diagnosis not present

## 2022-01-08 DIAGNOSIS — I5042 Chronic combined systolic (congestive) and diastolic (congestive) heart failure: Secondary | ICD-10-CM | POA: Diagnosis not present

## 2022-01-08 DIAGNOSIS — N186 End stage renal disease: Secondary | ICD-10-CM | POA: Diagnosis not present

## 2022-01-08 DIAGNOSIS — I2510 Atherosclerotic heart disease of native coronary artery without angina pectoris: Secondary | ICD-10-CM | POA: Diagnosis not present

## 2022-01-08 DIAGNOSIS — L97524 Non-pressure chronic ulcer of other part of left foot with necrosis of bone: Secondary | ICD-10-CM

## 2022-01-08 DIAGNOSIS — I110 Hypertensive heart disease with heart failure: Secondary | ICD-10-CM | POA: Diagnosis not present

## 2022-01-08 DIAGNOSIS — Z992 Dependence on renal dialysis: Secondary | ICD-10-CM | POA: Diagnosis not present

## 2022-01-08 LAB — GLUCOSE, CAPILLARY
Glucose-Capillary: 209 mg/dL — ABNORMAL HIGH (ref 70–99)
Glucose-Capillary: 175 mg/dL — ABNORMAL HIGH (ref 70–99)

## 2022-01-08 NOTE — Progress Notes (Signed)
Pflum, [place]ue[place] ([id number]) ?Visit Report for 01/08/2022 ?Arrival Information Details ?Patient Name: [name], [name] ?Date of Service: 01/08/2022 10:30 AM ?Medical Record Number: [id number] ?Patient Account Number: [id number] ?Date of Birth/Sex: [birth year] (56 y.o. M) ?Treating RN: Levora Dredge ?Primary Care Jaima Janney: [name] ?Other Clinician: Jacqulyn Bath ?Referring Duncan Alejandro: [name] ?Treating Rafan Sanders/Extender: Kalman Shan ?Weeks in Treatment: 9 ?Visit Information History Since Last Visit ?Added or deleted any medications: No ?Patient Arrived: Ambulatory ?Any new allergies or adverse reactions: No ?Arrival Time: 10:20 ?Had a fall or experienced change in No ?Accompanied By: self ?activities of daily living that may affect ?Transfer Assistance: None ?risk of falls: ?Patient Identification Verified: Yes ?Signs or symptoms of abuse/neglect since last visito No ?Secondary Verification Process Completed: Yes ?Hospitalized since last visit: No ?Patient Requires Transmission-Based No ?Implantable device outside of the clinic excluding No ?Precautions: ?cellular tissue based products placed in the center ?Patient Has Alerts: Yes ?since last visit: ?Patient Alerts: Patient on Blood ?Has Dressing in Place as Prescribed: Yes ?Thinner ?Has Footwear/Offloading in Place as Prescribed: Yes ?DIALYSIS RIGHT ARM ?Left: Surgical Shoe with Pressure ?Aspirin 81/Brilinta ?Relief Insole ?Pain Present Now: No ?Electronic Signature(s) ?Signed: 01/08/2022 2:15:42 PM By: Enedina Finner RCP, RRT, CHT ?Entered By: Enedina Finner on 01/08/2022 11:28:19 ?Meigs, [place] ([id number]) ?-------------------------------------------------------------------------------- ?Encounter Discharge Information Details ?Patient Name: [name], [name] ?Date of Service: 01/08/2022 10:30 AM ?Medical Record Number: [id number] ?Patient Account Number: [id number] ?Date of Birth/Sex: [birth year] (56 y.o. M) ?Treating RN: Levora Dredge ?Primary Care Chapel Silverthorn: [name] ?Other Clinician: Jacqulyn Bath ?Referring Despina Boan: [name] ?Treating Sharnell Knight/Extender: Kalman Shan ?Weeks in Treatment: 9 ?Encounter Discharge Information Items ?Discharge Condition: Stable ?Ambulatory Status: Ambulatory ?Discharge Destination: Home ?Transportation: Private Auto ?Accompanied By: self ?Schedule Follow-up Appointment: Yes ?Clinical Summary of Care: ?Notes ?Patient has an HBO treatment scheduled on 01/09/22 at 08:00 am. ?Electronic Signature(s) ?Signed: 01/08/2022 2:15:42 PM By: Enedina Finner RCP, RRT, CHT ?Entered By: Enedina Finner on 01/08/2022 13:16:41 ?Labine, [place] ([id number]) ?-------------------------------------------------------------------------------- ?Vitals Details ?Patient Name: [name], [name] ?Date of Service: 01/08/2022 10:30 AM ?Medical Record Number: [id number] ?Patient Account Number: [id number] ?Date of Birth/Sex: 1966-07-28 (56 y.o. M) ?Treating RN: Levora Dredge ?Primary Care Waverly Chavarria: [name] ?Other Clinician: Jacqulyn Bath ?Referring Rosha Cocker: [name] ?Treating Kida Digiulio/Extender: Kalman Shan ?Weeks in Treatment: 9 ?Vital Signs ?Time Taken: 10:49 ?Temperature (??F): 98.8 ?Height (in): 70 ?Pulse (bpm): 60 ?Weight (lbs): 180.4 ?Respiratory Rate (breaths/min): 16 ?Body Mass Index (BMI): 25.9 ?Blood Pressure (mmHg): 122/68 ?Capillary Blood Glucose (mg/dl): 209 ?Reference Range: 80 - 120 mg / dl ?Electronic Signature(s) ?Signed: 01/08/2022 2:15:42 PM By: Enedina Finner RCP, RRT, CHT ?Entered By: Enedina Finner on 01/08/2022 11:31:54 ?

## 2022-01-08 NOTE — Progress Notes (Signed)
Doenges, [place] ([id number]) ?Visit Report for 01/08/2022 ?HBO Details ?Patient Name: [name], [name] ?Date of Service: 01/08/2022 10:30 AM ?Medical Record Number: [id number] ?Patient Account Number: [id number] ?Date of Birth/Sex: 19-Mar-1966 (56 y.o. M) ?Treating RN: Levora Dredge ?Primary Care Blakely Gluth: [name] ?Other Clinician: Jacqulyn Bath ?Referring Anthonee Gelin: [name] ?Treating Sagar Tengan/Extender: Kalman Shan ?Weeks in Treatment: 9 ?HBO Treatment Course Details ?Treatment Course Number: 1 ?Ordering Viggo Perko: Jeri Cos ?Total Treatments Ordered: 60 ?HBO Treatment Start Date: 11/04/2021 ?HBO Indication: ?Chronic Refractory Osteomyelitis to Left Foot ?HBO Treatment Details ?Treatment Number: 69 ?Patient Type: Outpatient ?Chamber Type: Monoplace ?Chamber Serial #: [id number] ?Treatment Protocol: 2.0 ATA with 90 minutes oxygen, and no air breaks ?Treatment Details ?Compression Rate Down: 1.0 psi / minute ?De-Compression Rate Up: 1.5 psi / minute ?Compress Tx Pressure Air breaks and breathing periods Decompress Decompress ?Begins Reached (leave unused spaces blank) Begins Ends ?Chamber Pressure (ATA) 1 2 - - - - - - 2 1 ?Clock Time (24 hr) 10:59 11:15 - - - - - - 12:46 12:57 ?Treatment Length: 118 (minutes) ?Treatment Segments: 4 ?Vital Signs ?Capillary Blood Glucose Reference Range: 80 - 120 mg / dl ?HBO Diabetic Blood Glucose Intervention Range: <131 mg/dl or >249 mg/dl ?Time Vitals Blood Respiratory Capillary Blood Glucose Pulse Action ?Type: ?Pulse: Temperature: ?Taken: ?Pressure: ?Rate: ?Glucose (mg/dl): ?Meter #: Oximetry (%) Taken: ?Pre 10:49 122/68 60 16 98.8 209 1 none per protocol ?Post 13:00 132/68 72 16 98.7 175 1 none per protocol ?Treatment Response ?Treatment Toleration: Well ?Treatment Completion ?Treatment Completed without Adverse Event ?Status: ?HBO Attestation ?I certify that I supervised this HBO treatment in accordance with ?Medicare guidelines. A trained emergency response team is  readily Yes ?available per hospital policies and procedures. ?Continue HBOT as ordered. Yes ?Electronic Signature(s) ?Signed: 01/08/2022 2:36:22 PM By: Kalman Shan DO ?Previous Signature: 01/08/2022 2:15:42 PM Version By: Enedina Finner RCP, RRT, CHT ?Entered By: Kalman Shan on 01/08/2022 14:21:45 ?[name], [name] ([id number]) ?-------------------------------------------------------------------------------- ?HBO Safety Checklist Details ?Patient Name: [name], [name] ?Date of Service: 01/08/2022 10:30 AM ?Medical Record Number: [id number] ?Patient Account Number: [id number] ?Date of Birth/Sex: 1966/08/08 (56 y.o. M) ?Treating RN: Levora Dredge ?Primary Care Crandall Harvel: [name] ?Other Clinician: Jacqulyn Bath ?Referring Numa Schroeter: [name] ?Treating Rissie Sculley/Extender: Kalman Shan ?Weeks in Treatment: 9 ?HBO Safety Checklist Items ?Safety Checklist ?Consent Form Signed ?Patient voided / foley secured and emptied ?When did you last eato 10:00 am ?Last dose of injectable or oral agent n/a ?Ostomy pouch emptied and vented if applicable ?NA ?All implantable devices assessed, documented and approved ?NA ?Intravenous access site secured and place ?NA ?Valuables secured ?Linens and cotton and cotton/polyester blend (less than 51% polyester) ?Personal oil-based products / skin lotions / body lotions removed ?Wigs or hairpieces removed ?NA ?Smoking or tobacco materials removed ?NA ?Books / newspapers / magazines / loose paper removed ?NA ?Cologne, aftershave, perfume and deodorant removed ?Jewelry removed (may wrap wedding band) ?Make-up removed ?NA ?Hair care products removed ?Battery operated devices (external) removed ?NA ?Heating patches and chemical warmers removed ?NA ?Titanium eyewear removed ?NA ?Nail polish cured greater than 10 hours ?NA ?Casting material cured greater than 10 hours ?NA ?Hearing aids removed ?NA ?Loose dentures or partials removed ?NA ?Prosthetics have been  removed ?NA ?Patient demonstrates correct use of air break device (if applicable) ?Patient concerns have been addressed ?Patient grounding bracelet on and cord attached to chamber ?Specifics for Inpatients (complete in addition to ?above) ?Medication sheet sent with patient ?Intravenous medications needed or due during  therapy sent with patient ?Drainage tubes (e.g. nasogastric tube or chest tube secured and vented) ?Endotracheal or Tracheotomy tube secured ?Cuff deflated of air and inflated with saline ?Airway suctioned ?Electronic Signature(s) ?Signed: 01/08/2022 2:15:42 PM By: Enedina Finner RCP, RRT, CHT ?Entered By: Enedina Finner on 01/08/2022 11:33:19 ?

## 2022-01-08 NOTE — Progress Notes (Signed)
Wyles, Paden ([id number]) ?Visit Report for 01/07/2022 ?HBO Details ?Patient Name: [name], [name] ?Date of Service: 01/07/2022 8:00 AM ?Medical Record Number: [id number] ?Patient Account Number: [id number] ?Date of Birth/Sex: 1966/05/06 (56 y.o. M) ?Treating RN: Levora Dredge ?Primary Care Crestina Strike: [name] ?Other Clinician: Jacqulyn Bath ?Referring [name]: [name] ?Treating Guerline Happ/Extender: Jeri Cos ?Weeks in Treatment: 9 ?HBO Treatment Course Details ?Treatment Course Number: 1 ?Ordering Bernadine Melecio: Jeri Cos ?Total Treatments Ordered: 60 ?HBO Treatment Start Date: 11/04/2021 ?HBO Indication: ?Chronic Refractory Osteomyelitis to Left Foot ?HBO Treatment Details ?Treatment Number: 86 ?Patient Type: Outpatient ?Chamber Type: Monoplace ?Chamber Serial #: [id number] ?Treatment Protocol: 2.0 ATA with 90 minutes oxygen, and no air breaks ?Treatment Details ?Compression Rate Down: 1.5 psi / minute ?De-Compression Rate Up: 1.5 psi / minute ?Compress Tx Pressure Air breaks and breathing periods Decompress Decompress ?Begins Reached (leave unused spaces blank) Begins Ends ?Chamber Pressure (ATA) 1 2 - - - - - - 2 1 ?Clock Time (24 hr) 08:50 09:06 - - - - - - 10:37 10:48 ?Treatment Length: 118 (minutes) ?Treatment Segments: 4 ?Vital Signs ?Capillary Blood Glucose Reference Range: 80 - 120 mg / dl ?HBO Diabetic Blood Glucose Intervention Range: <131 mg/dl or >249 mg/dl ?Time Capillary Blood Glucose Pulse ?Blood Respiratory Action ?Type: Vitals ?Pulse: ?Temperature: Glucose Meter Oximetry ?Pressure: ?Rate: Taken: ?Taken: ?(mg/dl): ?#: (%) ?Pre 08:17 122/68 60 16 98 107 1 recheck - had just eaten ?none per protocol - proceed with ?Pre 08:38 167 1 ?HBO ?Post 10:52 128/72 60 16 98 191 1 none per protocol ?Treatment Response ?Treatment Toleration: Well ?Treatment Completion ?Treatment Completed without Adverse Event ?Status: ?Electronic Signature(s) ?Signed: 01/07/2022 1:32:46 PM By: Enedina Finner RCP,  RRT, CHT ?Signed: 01/08/2022 11:06:47 AM By: Worthy Keeler PA-C ?Entered By: Enedina Finner on 01/07/2022 11:59:32 ?Somoza, [place] ([id number]) ?-------------------------------------------------------------------------------- ?HBO Safety Checklist Details ?Patient Name: Ton, [name] ?Date of Service: 01/07/2022 8:00 AM ?Medical Record Number: [id number] ?Patient Account Number: [id number] ?Date of Birth/Sex: 17-Apr-1966 (56 y.o. M) ?Treating RN: Levora Dredge ?Primary Care Donne Baley: [name] ?Other Clinician: Jacqulyn Bath ?Referring Beautifull Cisar: [name] ?Treating Denyse Fillion/Extender: Jeri Cos ?Weeks in Treatment: 9 ?HBO Safety Checklist Items ?Safety Checklist ?Consent Form Signed ?Patient voided / foley secured and emptied ?When did you last eato 07:00 am ?Last dose of injectable or oral agent n/a ?Ostomy pouch emptied and vented if applicable ?NA ?All implantable devices assessed, documented and approved ?NA ?Intravenous access site secured and place ?NA ?Valuables secured ?Linens and cotton and cotton/polyester blend (less than 51% polyester) ?Personal oil-based products / skin lotions / body lotions removed ?Wigs or hairpieces removed ?NA ?Smoking or tobacco materials removed ?NA ?Books / newspapers / magazines / loose paper removed ?NA ?Cologne, aftershave, perfume and deodorant removed ?Jewelry removed (may wrap wedding band) ?Make-up removed ?NA ?Hair care products removed ?Battery operated devices (external) removed ?NA ?Heating patches and chemical warmers removed ?NA ?Titanium eyewear removed ?NA ?Nail polish cured greater than 10 hours ?NA ?Casting material cured greater than 10 hours ?NA ?Hearing aids removed ?NA ?Loose dentures or partials removed ?NA ?Prosthetics have been removed ?NA ?Patient demonstrates correct use of air break device (if applicable) ?Patient concerns have been addressed ?Patient grounding bracelet on and cord attached to chamber ?Specifics for Inpatients  (complete in addition to ?above) ?Medication sheet sent with patient ?Intravenous medications needed or due during therapy sent with patient ?Drainage tubes (e.g. nasogastric tube or chest tube secured and vented) ?Endotracheal or Tracheotomy tube secured ?Cuff  deflated of air and inflated with saline ?Airway suctioned ?Electronic Signature(s) ?Signed: 01/07/2022 1:32:46 PM By: Enedina Finner RCP, RRT, CHT ?Entered By: Enedina Finner on 01/07/2022 09:09:37 ?

## 2022-01-09 ENCOUNTER — Encounter: Payer: Medicare Other | Admitting: Physician Assistant

## 2022-01-09 DIAGNOSIS — I2510 Atherosclerotic heart disease of native coronary artery without angina pectoris: Secondary | ICD-10-CM | POA: Diagnosis not present

## 2022-01-09 DIAGNOSIS — I5042 Chronic combined systolic (congestive) and diastolic (congestive) heart failure: Secondary | ICD-10-CM | POA: Diagnosis not present

## 2022-01-09 DIAGNOSIS — L97524 Non-pressure chronic ulcer of other part of left foot with necrosis of bone: Secondary | ICD-10-CM | POA: Diagnosis not present

## 2022-01-09 DIAGNOSIS — I110 Hypertensive heart disease with heart failure: Secondary | ICD-10-CM | POA: Diagnosis not present

## 2022-01-09 DIAGNOSIS — E11621 Type 2 diabetes mellitus with foot ulcer: Secondary | ICD-10-CM | POA: Diagnosis not present

## 2022-01-09 DIAGNOSIS — M86672 Other chronic osteomyelitis, left ankle and foot: Secondary | ICD-10-CM | POA: Diagnosis not present

## 2022-01-09 LAB — GLUCOSE, CAPILLARY
Glucose-Capillary: 170 mg/dL — ABNORMAL HIGH (ref 70–99)
Glucose-Capillary: 168 mg/dL — ABNORMAL HIGH (ref 70–99)

## 2022-01-09 NOTE — Progress Notes (Signed)
Lader, [place] ([id number]) ?Visit Report for 01/09/2022 ?Arrival Information Details ?Patient Name: [name], [name] ?Date of Service: 01/09/2022 8:00 AM ?Medical Record Number: [id number] ?Patient Account Number: [id number] ?Date of Birth/Sex: 12-05-1965 (56 y.o. M) ?Treating RN: Levora Dredge ?Primary Care Carolee Channell: [name] ?Other Clinician: Jacqulyn Bath ?Referring Kayleanna Lorman: [name] ?Treating Jaleena Viviani/Extender: Jeri Cos ?Weeks in Treatment: 10 ?Visit Information History Since Last Visit ?Added or deleted any medications: No ?Patient Arrived: Ambulatory ?Any new allergies or adverse reactions: No ?Arrival Time: 08:02 ?Had a fall or experienced change in No ?Accompanied By: self ?activities of daily living that may affect ?Transfer Assistance: None ?risk of falls: ?Patient Identification Verified: Yes ?Signs or symptoms of abuse/neglect since last visito No ?Secondary Verification Process Completed: Yes ?Hospitalized since last visit: No ?Patient Requires Transmission-Based No ?Implantable device outside of the clinic excluding No ?Precautions: ?cellular tissue based products placed in the center ?Patient Has Alerts: Yes ?since last visit: ?Patient Alerts: Patient on Blood ?Has Dressing in Place as Prescribed: Yes ?Thinner ?Has Footwear/Offloading in Place as Prescribed: Yes ?DIALYSIS RIGHT ARM ?Left: Surgical Shoe with Pressure ?Aspirin 81/Brilinta ?Relief Insole ?Pain Present Now: No ?Electronic Signature(s) ?Signed: 01/09/2022 3:04:11 PM By: Enedina Finner RCP, RRT, CHT ?Entered By: Enedina Finner on 01/09/2022 09:10:46 ?[name], [name] ([id number]) ?-------------------------------------------------------------------------------- ?Encounter Discharge Information Details ?Patient Name: [name], [name] ?Date of Service: 01/09/2022 8:00 AM ?Medical Record Number: [id number] ?Patient Account Number: [id number] ?Date of Birth/Sex: 1966-03-13 (56 y.o. M) ?Treating RN: Levora Dredge ?Primary Care Doneen Ollinger: [name] ?Other Clinician: Jacqulyn Bath ?Referring Shaylon Gillean: [name] ?Treating Lesa Vandall/Extender: Jeri Cos ?Weeks in Treatment: 10 ?Encounter Discharge Information Items ?Discharge Condition: Stable ?Ambulatory Status: Ambulatory ?Discharge Destination: Home ?Transportation: Private Auto ?Accompanied By: self ?Schedule Follow-up Appointment: Yes ?Clinical Summary of Care: ?Notes ?Patient has an HBO treatment scheduled on 01/10/22 at 10:30 am. ?Electronic Signature(s) ?Signed: 01/09/2022 3:04:11 PM By: Enedina Finner RCP, RRT, CHT ?Entered By: Enedina Finner on 01/09/2022 11:07:52 ?[name], [name] ([id number]) ?-------------------------------------------------------------------------------- ?Vitals Details ?Patient Name: [name], [name] ?Date of Service: 01/09/2022 8:00 AM ?Medical Record Number: [id number] ?Patient Account Number: [id number] ?Date of Birth/Sex: 10/23/1965 (56 y.o. M) ?Treating RN: Levora Dredge ?Primary Care Caton Popowski: [name] ?Other Clinician: Jacqulyn Bath ?Referring Saleh Ulbrich: [name] ?Treating Jatniel Verastegui/Extender: Jeri Cos ?Weeks in Treatment: 10 ?Vital Signs ?Time Taken: 08:07 ?Temperature (??F): 98.4 ?Height (in): 70 ?Pulse (bpm): 72 ?Weight (lbs): 180.4 ?Respiratory Rate (breaths/min): 16 ?Body Mass Index (BMI): 25.9 ?Blood Pressure (mmHg): 126/72 ?Capillary Blood Glucose (mg/dl): 170 ?Reference Range: 80 - 120 mg / dl ?Electronic Signature(s) ?Signed: 01/09/2022 3:04:11 PM By: Enedina Finner RCP, RRT, CHT ?Entered By: Enedina Finner on 01/09/2022 09:11:31 ?

## 2022-01-10 ENCOUNTER — Encounter: Payer: Medicare Other | Admitting: Physician Assistant

## 2022-01-10 ENCOUNTER — Other Ambulatory Visit: Payer: Self-pay

## 2022-01-10 DIAGNOSIS — N186 End stage renal disease: Secondary | ICD-10-CM | POA: Diagnosis not present

## 2022-01-10 DIAGNOSIS — I2510 Atherosclerotic heart disease of native coronary artery without angina pectoris: Secondary | ICD-10-CM | POA: Diagnosis not present

## 2022-01-10 DIAGNOSIS — M86672 Other chronic osteomyelitis, left ankle and foot: Secondary | ICD-10-CM | POA: Diagnosis not present

## 2022-01-10 DIAGNOSIS — L97522 Non-pressure chronic ulcer of other part of left foot with fat layer exposed: Secondary | ICD-10-CM | POA: Diagnosis not present

## 2022-01-10 DIAGNOSIS — I110 Hypertensive heart disease with heart failure: Secondary | ICD-10-CM | POA: Diagnosis not present

## 2022-01-10 DIAGNOSIS — D631 Anemia in chronic kidney disease: Secondary | ICD-10-CM | POA: Diagnosis not present

## 2022-01-10 DIAGNOSIS — N2581 Secondary hyperparathyroidism of renal origin: Secondary | ICD-10-CM | POA: Diagnosis not present

## 2022-01-10 DIAGNOSIS — L97524 Non-pressure chronic ulcer of other part of left foot with necrosis of bone: Secondary | ICD-10-CM | POA: Diagnosis not present

## 2022-01-10 DIAGNOSIS — Z992 Dependence on renal dialysis: Secondary | ICD-10-CM | POA: Diagnosis not present

## 2022-01-10 DIAGNOSIS — E11621 Type 2 diabetes mellitus with foot ulcer: Secondary | ICD-10-CM | POA: Diagnosis not present

## 2022-01-10 DIAGNOSIS — I5042 Chronic combined systolic (congestive) and diastolic (congestive) heart failure: Secondary | ICD-10-CM | POA: Diagnosis not present

## 2022-01-10 LAB — GLUCOSE, CAPILLARY
Glucose-Capillary: 129 mg/dL — ABNORMAL HIGH (ref 70–99)
Glucose-Capillary: 167 mg/dL — ABNORMAL HIGH (ref 70–99)
Glucose-Capillary: 162 mg/dL — ABNORMAL HIGH (ref 70–99)

## 2022-01-10 NOTE — Progress Notes (Addendum)
Moster, [place] ([id number]) ?Visit Report for 01/10/2022 ?Chief Complaint Document Details ?Patient Name: Bauernfeind, Jatavius L. ?Date of Service: 01/10/2022 1:15 PM ?Medical Record Number: [id number] ?Patient Account Number: [id number] ?Date of Birth/Sex: Aug 23, 1966 (56 y.o. M) ?Treating RN: [name] ?Primary Care Provider: [name] Other Clinician: ?Referring Provider: [name] ?Treating Provider/Extender: Jeri Cos ?Weeks in Treatment: 10 ?Information Obtained from: Patient ?Chief Complaint ?HBO evaluation for treatment on the left foot diabetic ulcer with osteomyelitis. Patient is currently under our care as well for his foot. ?Electronic Signature(s) ?Signed: 01/10/2022 12:51:29 PM By: Worthy Keeler PA-C ?Entered By: Worthy Keeler on 01/10/2022 12:51:29 ?Dao, [place] ([id number]) ?-------------------------------------------------------------------------------- ?Debridement Details ?Patient Name: Stockinger, Thien L. ?Date of Service: 01/10/2022 1:15 PM ?Medical Record Number: [id number] ?Patient Account Number: [id number] ?Date of Birth/Sex: 1966/08/30 (56 y.o. M) ?Treating RN: [name] ?Primary Care Provider: [name] Other Clinician: ?Referring Provider: [name] ?Treating Provider/Extender: Jeri Cos ?Weeks in Treatment: 10 ?Debridement Performed for ?Wound #2 Left,Lateral Foot ?Assessment: ?Performed By: Physician Tommie Sams., PA-C ?Debridement Type: Debridement ?Severity of Tissue Pre Debridement: Fat layer exposed ?Level of Consciousness (Pre- ?Awake and Alert ?procedure): ?Pre-procedure Verification/Time Out ?Yes - 13:18 ?Taken: ?Start Time: 13:18 ?Total Area Debrided (L x W): 2 (cm) x 2 (cm) = 4 (cm?) ?Tissue and other material ?Viable, Non-Viable, Eschar, Slough, Subcutaneous, Skin: Dermis , Skin: Epidermis, Slough ?debrided: ?Level: Skin/Subcutaneous Tissue ?Debridement Description: Excisional ?Instrument: Curette ?Bleeding: Minimum ?Hemostasis Achieved: Pressure ?End Time:  13:22 ?Procedural Pain: 0 ?Post Procedural Pain: 0 ?Response to Treatment: Procedure was tolerated well ?Level of Consciousness (Post- ?Awake and Alert ?procedure): ?Post Debridement Measurements of Total Wound ?Length: (cm) 2 ?Width: (cm) 2 ?Depth: (cm) 1.1 ?Volume: (cm?) 3.456 ?Character of Wound/Ulcer Post Debridement: Improved ?Severity of Tissue Post Debridement: Fat layer exposed ?Post Procedure Diagnosis ?Same as Pre-procedure ?Electronic Signature(s) ?Signed: 01/10/2022 5:58:01 PM By: Worthy Keeler PA-C ?Signed: 01/14/2022 4:01:37 PM By: [name] RN ?Entered By: [name] on 01/10/2022 13:20:01 ?Evitt, [place] ([id number]) ?-------------------------------------------------------------------------------- ?HPI Details ?Patient Name: Aldama, Treylen L. ?Date of Service: 01/10/2022 1:15 PM ?Medical Record Number: [id number] ?Patient Account Number: [id number] ?Date of Birth/Sex: Nov 19, 1965 (56 y.o. M) ?Treating RN: [name] ?Primary Care Provider: [name] Other Clinician: ?Referring Provider: [name] ?Treating Provider/Extender: Jeri Cos ?Weeks in Treatment: 10 ?History of Present Illness ?HPI Description: 10/31/2021 patient presents today for evaluation here in our clinic. He is actually being evaluated for hyperbaric oxygen therapy ?only. He has been seen at [facility] up to this point he is under the care of the vascular surgery specialty. Subsequently he is also ?under the care of the hyperbaric center there. With that being said that due to the fact that he is a dialysis patient he was actually recommended ?to be transferred to [place] for his treatments he actually lives in [place] which is where his dialysis is. It was a hardship for him to try to get from ?[place] especially on dialysis days all the way to [facility] get into the facility for his treatment and get back home he was literally spending ?all day long. He has had just 4 treatments there at [facility] thus far based on  what I see in these have not been continued while he awaits approval ?here in our clinic. Subsequently I do have records for review that will be included in the HPI predetermination review and attached to this chart as ?well. I will not duplicate that here. 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 Nonetheless he continues to have exposed bone with ?marked soft tissue swelling compatible with osteomyelitis. This is due to the irregularity of the head of the metatarsal as well. Nonetheless along ?with having associated cellulitis he is also good to be seen by infectious disease and is currently on antibiotics for this as well. Again that will be ?detailed in the pretreatment review section which I will attach to this note as well. He does currently have a wound VAC in place and again the ?wound was not evaluated here in the clinic as that is being completely managed by home health and [facility]. ?The patient does have a history of diabetes mellitus type 2, hypertension, coronary artery disease, and congestive heart failure. He also has ?cataracts of both eyes but no evidence of glaucoma on his most recent eye exam. ?11/22/2021 we have been seeing this gentleman for hyperbaric oxygen therapy but have not actually evaluated his wound up to this point this was ?being managed by the wound care and vascular team I presumed at [place] at least that is what has been told to be previous. Nonetheless at this ?point I got a call from Atlee Abide who is a Librarian, academic at the [facility] vascular clinic with Dr. Durene Fruits and subsequently they were wanting ?to know if I could take over wound care for this patient. Apparently they do not have the ability for an office debridement which again I completely ?understand but nonetheless definitely is not an integral part of his  healing along with the hyperbarics. Subsequently I discussed with the patient ?today that I do believe he would benefit going ahead and proceeding with evaluation here in the clinic for that reason I did go ahead and see him ?today to get things started. There is also been some confusion about the issues with his home health nurse and getting measurements to [facility] for ?the wound VAC in order to continue his wound VAC therapy. With that being said after I see him today we will make a determination on what to ?do with wound VAC we can definitely send measurements to [facility] but again the bigger question is whether this is the best way to go or not. ?11/28/2021 upon evaluation today patient appears to be doing decently well in regard to his wound. He has been tolerating the dressing changes ?with the Dakin's moistened gauze dressing which I think is doing a good job. Fortunately I do not see any signs of active infection locally nor ?systemically at this point. In general I think that he is actually making excellent progress which is great news. ?2/17; seen in conjunction with HBO today. 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 Much improved condition ?of the wound surface. On the lateral foot and eschared area that he has been using Betadine. Tolerating HBO well ?12/13/2021 upon evaluation today patient appears to be doing pretty well in regard to his foot ulcer. I am actually much more pleased currently ?that I been with the way the appearance looks today. The wound bed does show some signs of slough and biofilm on the surface of the wound ?this is minimal I Minna perform debridement the I will do so very carefully due to the fact that to be honest the patient had a significant amount of ?  bleeding in the past being on the Brilinta he had a difficult time clotting when I debrided him previously quite significantly. Fortunately I do not ?see any signs of active infection at this time which is  great news. No fevers, chills, nausea, vomiting, or diarrhea. ?12/20/2021 upon evaluation today patient actually appears to be showing some signs of excellent improvement here. I am very pleased with where ?thin

## 2022-01-10 NOTE — Progress Notes (Addendum)
Delio, [place] ([id number]) ?Visit Report for 01/10/2022 ?Arrival Information Details ?Patient Name: [name], [name] ?Date of Service: 01/10/2022 10:30 AM ?Medical Record Number: [id number] ?Patient Account Number: [id number] ?Date of Birth/Sex: [birth year] (56 y.o. M) ?Treating RN: Cornell Barman ?Primary Care Nevada Kirchner: [name] ?Other Clinician: Jacqulyn Bath ?Referring Pollyann Roa: [name] ?Treating Lamari Beckles/Extender: Jeri Cos ?Weeks in Treatment: 10 ?Visit Information History Since Last Visit ?Pain Present Now: No ?Patient Arrived: Ambulatory ?Arrival Time: 10:30 ?Transfer Assistance: None ?Patient Requires Transmission-Based No ?Precautions: ?Patient Has Alerts: Yes ?Patient Alerts: Patient on Blood ?Thinner ?DIALYSIS RIGHT ARM ?Aspirin 81/Brilinta ?Electronic Signature(s) ?Signed: 01/10/2022 10:52:29 AM By: Gretta Cool, BSN, RN, CWS, Kim RN, BSN ?Entered By: Gretta Cool, BSN, RN, CWS, Kim on 01/10/2022 10:52:29 ?[name], [name] ([id number]) ?-------------------------------------------------------------------------------- ?Encounter Discharge Information Details ?Patient Name: [name], [name] ?Date of Service: 01/10/2022 10:30 AM ?Medical Record Number: [id number] ?Patient Account Number: [id number] ?Date of Birth/Sex: 14-Jun-1966 (57 y.o. M) ?Treating RN: Cornell Barman ?Primary Care Sherrol Vicars: [name] ?Other Clinician: Jacqulyn Bath ?Referring Joeanne Robicheaux: [name] ?Treating Alegria Dominique/Extender: Jeri Cos ?Weeks in Treatment: 10 ?Encounter Discharge Information Items ?Discharge Condition: Stable ?Ambulatory Status: Ambulatory ?Discharge Destination: Home ?Transportation: Private Auto ?Accompanied By: self ?Schedule Follow-up Appointment: Yes ?Clinical Summary of Care: ?Electronic Signature(s) ?Signed: 01/10/2022 12:47:15 PM By: Gretta Cool, BSN, RN, CWS, Kim RN, BSN ?Entered By: Gretta Cool, BSN, RN, CWS, Kim on 01/10/2022 12:47:15 ?[name], [name]  ([id number]) ?-------------------------------------------------------------------------------- ?Vitals Details ?Patient Name: [name], [name] ?Date of Service: 01/10/2022 10:30 AM ?Medical Record Number: [id number] ?Patient Account Number: [id number] ?Date of Birth/Sex: 1966/08/23 (56 y.o. M) ?Treating RN: Cornell Barman ?Primary Care Chanson Teems: [name] ?Other Clinician: Jacqulyn Bath ?Referring Aulani Shipton: [name] ?Treating Dawsen Krieger/Extender: Jeri Cos ?Weeks in Treatment: 10 ?Vital Signs ?Time Taken: 10:37 ?Temperature (??F): 98.4 ?Height (in): 70 ?Pulse (bpm): 78 ?Weight (lbs): 180.4 ?Respiratory Rate (breaths/min): 16 ?Body Mass Index (BMI): 25.9 ?Blood Pressure (mmHg): 128/78 ?Capillary Blood Glucose (mg/dl): 167 ?Reference Range: 80 - 120 mg / dl ?Electronic Signature(s) ?Signed: 01/10/2022 10:53:20 AM By: Gretta Cool, BSN, RN, CWS, Kim RN, BSN ?Entered By: Gretta Cool, BSN, RN, CWS, Kim on 01/10/2022 10:53:19 ?

## 2022-01-10 NOTE — Progress Notes (Addendum)
Soh, [place] ([id number]) ?Visit Report for 01/10/2022 ?Arrival Information Details ?Patient Name: [name], [name] ?Date of Service: 01/10/2022 1:15 PM ?Medical Record Number: [id number] ?Patient Account Number: [id number] ?Date of Birth/Sex: [birth year] (56 y.o. M) ?Treating RN: [name] ?Primary Care Egan Sahlin: [name] Other Clinician: ?Referring Nemesio Castrillon: [name] ?Treating Sartaj Hoskin/Extender: Jeri Cos ?Weeks in Treatment: 10 ?Visit Information History Since Last Visit ?All ordered tests and consults were completed: No ?Patient Arrived: Ambulatory ?Added or deleted any medications: No ?Arrival Time: 13:02 ?Any new allergies or adverse reactions: No ?Accompanied By: self ?Had a fall or experienced change in No ?Transfer Assistance: None ?activities of daily living that may affect ?Patient Identification Verified: Yes ?risk of falls: ?Secondary Verification Process Completed: Yes ?Signs or symptoms of abuse/neglect since last visito No ?Patient Requires Transmission-Based No ?Hospitalized since last visit: No ?Precautions: ?Implantable device outside of the clinic excluding No ?Patient Has Alerts: Yes ?cellular tissue based products placed in the center ?Patient Alerts: Patient on Blood ?since last visit: ?Thinner ?Has Dressing in Place as Prescribed: Yes ?DIALYSIS RIGHT ARM ?Pain Present Now: No ?Aspirin 81/Brilinta ?Electronic Signature(s) ?Signed: 01/14/2022 4:01:37 PM By: [name] RN ?Entered By: [name] on 01/10/2022 13:02:51 ?[place], [place] ([id number]) ?-------------------------------------------------------------------------------- ?Clinic Level of Care Assessment Details ?Patient Name: [name], [name] ?Date of Service: 01/10/2022 1:15 PM ?Medical Record Number: [id number] ?Patient Account Number: [id number] ?Date of Birth/Sex: 1966/04/21 (56 y.o. M) ?Treating RN: [name] ?Primary Care Wanza Szumski: [name] Other Clinician: ?Referring Delsin Copen: [name] ?Treating  Dorwin Fitzhenry/Extender: Jeri Cos ?Weeks in Treatment: 10 ?Clinic Level of Care Assessment Items ?TOOL 1 Quantity Score ?[]  - Use when EandM and Procedure is performed on INITIAL visit 0 ?ASSESSMENTS - Nursing Assessment / Reassessment ?[]  - General Physical Exam (combine w/ comprehensive assessment (listed just below) when performed on new ?0 ?pt. evals) ?[]  - 0 ?Comprehensive Assessment (HX, ROS, Risk Assessments, Wounds Hx, etc.) ?ASSESSMENTS - Wound and Skin Assessment / Reassessment ?[]  - Dermatologic / Skin Assessment (not related to wound area) 0 ?ASSESSMENTS - Ostomy and/or Continence Assessment and Care ?[]  - Incontinence Assessment and Management 0 ?[]  - 0 ?Ostomy Care Assessment and Management (repouching, etc.) ?PROCESS - Coordination of Care ?[]  - Simple Patient / Family Education for ongoing care 0 ?[]  - 0 ?Complex (extensive) Patient / Family Education for ongoing care ?[]  - 0 ?Staff obtains Consents, Records, Test Results / Process Orders ?[]  - 0 ?Staff telephones HHA, Nursing Homes / Clarify orders / etc ?[]  - 0 ?Routine Transfer to another Facility (non-emergent condition) ?[]  - 0 ?Routine Hospital Admission (non-emergent condition) ?[]  - 0 ?New Admissions / Biomedical engineer / Ordering NPWT, Apligraf, etc. ?[]  - 0 ?Emergency Hospital Admission (emergent condition) ?PROCESS - Special Needs ?[]  - Pediatric / Minor Patient Management 0 ?[]  - 0 ?Isolation Patient Management ?[]  - 0 ?Hearing / Language / Visual special needs ?[]  - 0 ?Assessment of Community assistance (transportation, D/C planning, etc.) ?[]  - 0 ?Additional assistance / Altered mentation ?[]  - 0 ?Support Surface(s) Assessment (bed, cushion, seat, etc.) ?INTERVENTIONS - Miscellaneous ?[]  - External ear exam 0 ?[]  - 0 ?Patient Transfer (multiple staff / Civil Service fast streamer / Similar devices) ?[]  - 0 ?Simple Staple / Suture removal (25 or less) ?[]  - 0 ?Complex Staple / Suture removal (26 or more) ?[]  - 0 ?Hypo/Hyperglycemic Management (do not  check if billed separately) ?[]  - 0 ?Ankle / Brachial Index (ABI) - do not check if billed separately ?Has the patient been seen at the  hospital within the last three years: Yes ?Total Score: 0 ?Level Of Care: ____ ?Snell, [place] ([id number]) ?Electronic Signature(s) ?Signed: 01/14/2022 4:01:37 PM By: [name] RN ?Entered By: [name] on 01/10/2022 13:22:29 ?[place], [place] ([id number]) ?-------------------------------------------------------------------------------- ?Encounter Discharge Information Details ?Patient Name: [name], [name] ?Date of Service: 01/10/2022 1:15 PM ?Medical Record Number: [id number] ?Patient Account Number: [id number] ?Date of Birth/Sex: 04/12/1966 (56 y.o. M) ?Treating RN: [name] ?Primary Care Desmen Schoffstall: [name] Other Clinician: ?Referring Tippi Mccrae: [name] ?Treating Miesha Bachmann/Extender: Jeri Cos ?Weeks in Treatment: 10 ?Encounter Discharge Information Items Post Procedure Vitals ?Discharge Condition: Stable ?Temperature (?F): 98.4 ?Ambulatory Status: Ambulatory ?Pulse (bpm): 78 ?Discharge Destination: Home ?Respiratory Rate (breaths/min): 16 ?Transportation: Private Auto ?Blood Pressure (mmHg): 128/78 ?Accompanied By: self ?Schedule Follow-up Appointment: Yes ?Clinical Summary of Care: Patient Declined ?Electronic Signature(s) ?Signed: 01/14/2022 4:01:37 PM By: [name] RN ?Entered By: [name] on 01/10/2022 13:24:15 ?[place], [place] ([id number]) ?-------------------------------------------------------------------------------- ?Lower Extremity Assessment Details ?Patient Name: [name], [name] ?Date of Service: 01/10/2022 1:15 PM ?Medical Record Number: [id number] ?Patient Account Number: [id number] ?Date of Birth/Sex: 1966-08-26 (56 y.o. M) ?Treating RN: [name] ?Primary Care Aliyah Abeyta: [name] Other Clinician: ?Referring Conlin Brahm: [name] ?Treating Quinlan Mcfall/Extender: Jeri Cos ?Weeks in Treatment: 10 ?Edema Assessment ?Assessed: [Left: No]  [Right: No] ?Edema: [Left: Ye] [Right: s] ?Calf ?Left: Right: ?Point of Measurement: 33 cm From Medial Instep 39 cm ?Ankle ?Left: Right: ?Point of Measurement: 10 cm From Medial Instep 27 cm ?Vascular Assessment ?Pulses: ?Dorsalis Pedis ?Palpable: [Left:Yes] ?Electronic Signature(s) ?Signed: 01/14/2022 4:01:37 PM By: [name] RN ?Entered By: [name] on 01/10/2022 13:12:52 ?Pann, [place] ([id number]) ?-------------------------------------------------------------------------------- ?Multi Wound Chart Details ?Patient Name: [name], [name] ?Date of Service: 01/10/2022 1:15 PM ?Medical Record Number: [id number] ?Patient Account Number: [id number] ?Date of Birth/Sex: 1966-06-15 (57 y.o. 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 ?Comorbid History: Cataracts, Anemia, Sleep Apnea, Cataracts, Anemia, Sleep Apnea, N/A ?Arrhythmia, Congestive Heart Arrhythmia, Congestive Heart ?Failure, Coronary Artery Disease, Failure, Coronary Artery Disease, ?Hypertension, Myocardial Infarction, Hypertension, Myocardial Infarction, ?Type II Diabetes, Neuropathy, Type II Diabetes, Neuropathy, ?Seizure Disorder Seizure Disorder ?Date Acquired: 09/09/2021 10/28/2021 N/A ?Weeks of Treatment: 7 6 N/A ?Wound Status: Open Open N/A ?Wound Recurrence: No No N/A ?Measurements L x W x D (cm) 2x5x1.2 2x2x1.1 N/A ?Area (cm?) : 7.854 3.142 N/A ?Volume (cm?) : 9.425 3.456 N/A ?% Reduction in Area:  44.40% 0.00% N/A ?% Reduction in Volume: 77.00% -1000.60% N/A ?Classification: Full Thickness With Exposed Grade 2 N/A ?Support Structures ?Exudate Amount: Medium Medium N/A ?Exudate Type: Serosanguineous Serosanguineous N

## 2022-01-10 NOTE — Progress Notes (Addendum)
Akter, [place] ([id number]) ?Visit Report for 01/10/2022 ?HBO Details ?Patient Name: [name], [name] ?Date of Service: 01/10/2022 10:30 AM ?Medical Record Number: [id number] ?Patient Account Number: [id number] ?Date of Birth/Sex: [birth year] (56 y.o. 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Capillary Blood Glucose Reference Range: 80 - 120 mg / dl ?HBO Diabetic Blood Glucose Intervention Range: <131 mg/dl or >249 mg/dl ?Time Vitals Blood Respiratory Capillary Blood Glucose Pulse Action ?Type: ?Pulse: Temperature: ?Taken: ?Pressure: ?Rate: ?Glucose (mg/dl): ?Meter #: Oximetry (%) Taken: ?Pre 10:37 128/78 78 16 98.4 167 ?Post 12:44 143/80 78 16 98.3 162 ?Treatment Response ?Treatment Toleration: Well ?Treatment Completion ?Treatment Completed without Adverse Event ?Status: ?Electronic Signature(s) ?Signed: 01/10/2022 12:46:39 PM By: Gretta Cool, BSN, RN, CWS, Kim RN, BSN ?Signed: 01/10/2022 5:58:01 PM By: Worthy Keeler PA-C ?Previous Signature: 01/10/2022 12:45:55 PM  Version By: Gretta Cool, BSN, RN, CWS, Kim RN, BSN ?Previous Signature: 01/10/2022 12:28:57 PM Version By: Gretta Cool, BSN, RN, CWS, Kim RN, BSN ?Previous Signature: 01/10/2022 11:01:05 AM Version By: Gretta Cool, BSN, RN, CWS, Kim RN, BSN ?Previous Signature: 01/10/2022 10:58:12 AM Version By: Gretta Cool, BSN, RN, CWS, Kim RN, BSN ?Previous Signature: 01/10/2022 10:54:42 AM Version By: Gretta Cool, BSN, RN, CWS, Kim RN, BSN ?Entered By: Gretta Cool, BSN, RN, CWS, Kim on 01/10/2022 12:46:38 ?Schoff, [place] ([id number]) ?-------------------------------------------------------------------------------- ?HBO Safety Checklist Details ?Patient Name: Eggleton, Dugan L. ?Date of Service: 01/10/2022 10:30 AM ?Medical Record Number: [id number] ?Patient Account Number: [id number] ?Date of Birth/Sex: 1966/03/11 (56 y.o. M) ?Treating RN: Cornell Barman ?Primary Care Navpreet Szczygiel: [name] ?Other Clinician: Jacqulyn Bath ?Referring Almon [name]: [name] ?Treating Araiyah Cumpton/Extender: Jeri Cos ?Weeks in Treatment: 10 ?HBO Safety Checklist Items ?Safety Checklist ?Consent Form Signed ?Patient voided / foley secured and emptied ?When did you last eato am ?Last dose of injectable or oral agent na ?Ostomy pouch emptied and vented if applicable ?NA ?All implantable devices assessed, documented and approved ?NA ?Intravenous access site secured and place ?NA ?Valuables secured ?Linens and cotton and cotton/polyester blend (less than 51% polyester) ?Personal oil-based products / skin lotions / body lotions removed ?Wigs or hairpieces removed ?NA ?Smoking or tobacco materials removed ?NA ?Books / newspapers / magazines / loose paper removed ?NA ?Cologne, aftershave, perfume and deodorant removed ?Jewelry removed (may wrap wedding band) ?NA ?Make-up removed ?NA ?Hair care products removed ?NA ?Battery operated devices (external) removed ?NA ?Heating patches and chemical warmers removed ?NA ?Titanium eyewear removed ?NA ?Nail polish cured greater than 10 hours ?NA ?Casting material  cured greater than 10 hours ?NA ?Hearing aids removed ?NA ?Loose dentures or partials removed ?NA ?Prosthetics have been removed ?NA ?Patient demonstrates correct use of air break device (if applicable) ?Patient  concerns have been addressed ?Patient grounding bracelet on and cord attached to chamber ?Specifics for Inpatients (complete in addition to ?above) ?Medication sheet sent with patient ?Intravenous medications needed or due during therapy sent with patient ?Drainage tubes (e.g. nasogastric tube or chest tube secured and vented) ?Endotracheal or Tracheotomy tube secured ?Cuff deflated of air and inflated with saline ?Airway suctioned ?Electronic Signature(s) ?Signed: 01/10/2022 10:54:15 AM By: Gretta Cool, BSN, RN, CWS, Kim RN, BSN ?Entered By: Gretta Cool, BSN, RN, CWS, Kim on 01/10/2022 10:54:15 ?

## 2022-01-11 NOTE — Progress Notes (Signed)
Goel, [place] ([id number]) ?Visit Report for 01/09/2022 ?HBO Details ?Patient Name: [name], [name] ?Date of Service: 01/09/2022 8:00 AM ?Medical Record Number: [id number] ?Patient Account Number: [id number] ?Date of Birth/Sex: 29-Sep-1966 (56 y.o. M) ?Treating RN: Levora Dredge ?Primary Care Sarchet Mulvey: [name] ?Other Clinician: Jacqulyn Bath ?Referring Mickaela Starlin: [name] ?Treating Amandine Covino/Extender: Jeri Cos ?Weeks in Treatment: 10 ?HBO Treatment Course Details ?Treatment Course Number: 1 ?Ordering Dorissa Stinnette: Jeri Cos ?Total Treatments Ordered: 60 ?HBO Treatment Start Date: 11/04/2021 ?HBO Indication: ?Chronic Refractory Osteomyelitis to Left Foot ?HBO Treatment Details ?Treatment Number: 31 ?Patient Type: Outpatient ?Chamber Type: Monoplace ?Chamber Serial #: [id number] ?Treatment Protocol: 2.0 ATA with 90 minutes oxygen, and no air breaks ?Treatment Details ?Compression Rate Down: 1.0 psi / minute ?De-Compression Rate Up: 1.5 psi / minute ?Compress Tx Pressure Air breaks and breathing periods Decompress Decompress ?Begins Reached (leave unused spaces blank) Begins Ends ?Chamber Pressure (ATA) 1 2 - - - - - - 2 1 ?Clock Time (24 hr) 08:24 08:41 - - - - - - 10:11 10:22 ?Treatment Length: 118 (minutes) ?Treatment Segments: 4 ?Vital Signs ?Capillary Blood Glucose Reference Range: 80 - 120 mg / dl ?HBO Diabetic Blood Glucose Intervention Range: <131 mg/dl or >249 mg/dl ?Time Vitals Blood Respiratory Capillary Blood Glucose Pulse Action ?Type: ?Pulse: Temperature: ?Taken: ?Pressure: ?Rate: ?Glucose (mg/dl): ?Meter #: Oximetry (%) Taken: ?Pre 08:07 126/72 72 16 98.4 170 1 none per protocol ?Post 10:27 138/76 72 16 98.4 168 1 none per protocol ?Treatment Response ?Treatment Toleration: Well ?Treatment Completion ?Treatment Completed without Adverse Event ?Status: ?Electronic Signature(s) ?Signed: 01/09/2022 3:04:11 PM By: Enedina Finner RCP, RRT, CHT ?Signed: 01/10/2022 5:58:01 PM By: Worthy Keeler  PA-C ?Entered By: Enedina Finner on 01/09/2022 11:06:52 ?[name], [name] ([id number]) ?-------------------------------------------------------------------------------- ?HBO Safety Checklist Details ?Patient Name: [name], [name] ?Date of Service: 01/09/2022 8:00 AM ?Medical Record Number: [id number] ?Patient Account Number: [id number] ?Date of Birth/Sex: 1966/05/10 (56 y.o. M) ?Treating RN: Levora Dredge ?Primary Care Nelva Hauk: [name] ?Other Clinician: Jacqulyn Bath ?Referring Ayren Zumbro: [name] ?Treating Armando Bukhari/Extender: Jeri Cos ?Weeks in Treatment: 10 ?HBO Safety Checklist Items ?Safety Checklist ?Consent Form Signed ?Patient voided / foley secured and emptied ?When did you last eato 07:00 am ?Last dose of injectable or oral agent n/a ?Ostomy pouch emptied and vented if applicable ?NA ?All implantable devices assessed, documented and approved ?NA ?Intravenous access site secured and place ?NA ?Valuables secured ?Linens and cotton and cotton/polyester blend (less than 51% polyester) ?Personal oil-based products / skin lotions / body lotions removed ?Wigs or hairpieces removed ?NA ?Smoking or tobacco materials removed ?NA ?Books / newspapers / magazines / loose paper removed ?NA ?Cologne, aftershave, perfume and deodorant removed ?Jewelry removed (may wrap wedding band) ?Make-up removed ?NA ?Hair care products removed ?Battery operated devices (external) removed ?NA ?Heating patches and chemical warmers removed ?NA ?Titanium eyewear removed ?NA ?Nail polish cured greater than 10 hours ?NA ?Casting material cured greater than 10 hours ?NA ?Hearing aids removed ?NA ?Loose dentures or partials removed ?NA ?Prosthetics have been removed ?NA ?Patient demonstrates correct use of air break device (if applicable) ?Patient concerns have been addressed ?Patient grounding bracelet on and cord attached to chamber ?Specifics for Inpatients (complete in addition to ?above) ?Medication sheet sent with  patient ?Intravenous medications needed or due during therapy sent with patient ?Drainage tubes (e.g. nasogastric tube or chest tube secured and vented) ?Endotracheal or Tracheotomy tube secured ?Cuff deflated of air and inflated with saline ?Airway suctioned ?Electronic Signature(s) ?Signed: 01/09/2022  3:04:11 PM By: Enedina Finner RCP, RRT, CHT ?Entered By: Enedina Finner on 01/09/2022 09:12:38 ?

## 2022-01-13 ENCOUNTER — Other Ambulatory Visit: Payer: Self-pay

## 2022-01-13 ENCOUNTER — Encounter: Payer: Medicare Other | Admitting: Physician Assistant

## 2022-01-13 DIAGNOSIS — D631 Anemia in chronic kidney disease: Secondary | ICD-10-CM | POA: Diagnosis not present

## 2022-01-13 DIAGNOSIS — N186 End stage renal disease: Secondary | ICD-10-CM | POA: Diagnosis not present

## 2022-01-13 DIAGNOSIS — I110 Hypertensive heart disease with heart failure: Secondary | ICD-10-CM | POA: Diagnosis not present

## 2022-01-13 DIAGNOSIS — I5042 Chronic combined systolic (congestive) and diastolic (congestive) heart failure: Secondary | ICD-10-CM | POA: Diagnosis not present

## 2022-01-13 DIAGNOSIS — I2510 Atherosclerotic heart disease of native coronary artery without angina pectoris: Secondary | ICD-10-CM | POA: Diagnosis not present

## 2022-01-13 DIAGNOSIS — E11621 Type 2 diabetes mellitus with foot ulcer: Secondary | ICD-10-CM | POA: Diagnosis not present

## 2022-01-13 DIAGNOSIS — L97524 Non-pressure chronic ulcer of other part of left foot with necrosis of bone: Secondary | ICD-10-CM | POA: Diagnosis not present

## 2022-01-13 DIAGNOSIS — N2581 Secondary hyperparathyroidism of renal origin: Secondary | ICD-10-CM | POA: Diagnosis not present

## 2022-01-13 DIAGNOSIS — M86672 Other chronic osteomyelitis, left ankle and foot: Secondary | ICD-10-CM | POA: Diagnosis not present

## 2022-01-13 DIAGNOSIS — Z992 Dependence on renal dialysis: Secondary | ICD-10-CM | POA: Diagnosis not present

## 2022-01-13 LAB — GLUCOSE, CAPILLARY
Glucose-Capillary: 154 mg/dL — ABNORMAL HIGH (ref 70–99)
Glucose-Capillary: 115 mg/dL — ABNORMAL HIGH (ref 70–99)

## 2022-01-13 NOTE — Progress Notes (Signed)
Noyes, [place] ([id number]) ?Visit Report for 01/13/2022 ?HBO Details ?Patient [name]: [name], [name] ?Date of Service: 01/13/2022 10:30 AM ?Medical Record Number: [id number] ?Patient Account Number: [id number] ?Date of Birth/Sex: [birth year] (56 y.o. M) ?Treating RN: Cornell Barman ?Primary Care Nayab Aten: [name] ?Other Clinician: Jacqulyn Bath ?Referring Harbour Nordmeyer: [name] ?Treating Zyairah Wacha/Extender: Jeri Cos ?Weeks in Treatment: 10 ?HBO Treatment Course Details ?Treatment Course Number: 1 ?Ordering Yoandri Congrove: Jeri Cos ?Total Treatments Ordered: 60 ?HBO Treatment Start Date: 11/04/2021 ?HBO Indication: ?Chronic Refractory Osteomyelitis to Left Foot ?HBO Treatment Details ?Treatment Number: 7 ?Patient Type: Outpatient ?Chamber Type: Monoplace ?Chamber Serial #: [id number] ?Treatment Protocol: 2.0 ATA with 90 minutes oxygen, and no air breaks ?Treatment Details ?Compression Rate Down: 1.5 psi / minute ?De-Compression Rate Up: 2.0 psi / minute ?Compress Tx Pressure Air breaks and breathing periods Decompress Decompress ?Begins Reached (leave unused spaces blank) Begins Ends ?Chamber Pressure (ATA) 1 2 - - - - - - 2 1 ?Clock Time (24 hr) 10:38 10:48 - - - - - - 12:19 12:26 ?Treatment Length: 108 (minutes) ?Treatment Segments: 4 ?Vital Signs ?Capillary Blood Glucose Reference Range: 80 - 120 mg / dl ?HBO Diabetic Blood Glucose Intervention Range: <131 mg/dl or >249 mg/dl ?Time Vitals Blood Respiratory Capillary Blood Glucose Pulse Action ?Type: ?Pulse: Temperature: ?Taken: ?Pressure: ?Rate: ?Glucose (mg/dl): ?Meter #: Oximetry (%) Taken: ?Pre 10:19 132/72 72 16 98 154 1 none per protocol ?Post 12:38 138/72 78 16 98.6 115 1 none per protocol ?Treatment Response ?Treatment Toleration: Well ?Treatment Completion ?Treatment Completed without Adverse Event ?Status: ?Electronic Signature(s) ?Signed: 01/13/2022 1:20:57 PM By: Enedina Finner RCP, RRT, CHT ?Signed: 01/13/2022 3:51:56 PM By: Worthy Keeler  PA-C ?Entered By: Enedina Finner on 01/13/2022 13:19:34 ?[place], [place] ([id number]) ?-------------------------------------------------------------------------------- ?HBO Safety Checklist Details ?Patient [name]: [name], [name] ?Date of Service: 01/13/2022 10:30 AM ?Medical Record Number: [id number] ?Patient Account Number: [id number] ?Date of Birth/Sex: 1966/10/16 (56 y.o. M) ?Treating RN: Cornell Barman ?Primary Care Toris Laverdiere: [name] ?Other Clinician: Jacqulyn Bath ?Referring Ridhima Golberg: [name] ?Treating Veryl Abril/Extender: Jeri Cos ?Weeks in Treatment: 10 ?HBO Safety Checklist Items ?Safety Checklist ?Consent Form Signed ?Patient voided / foley secured and emptied ?When did you last eato 9:30 am ?Last dose of injectable or oral agent n/a ?Ostomy pouch emptied and vented if applicable ?NA ?All implantable devices assessed, documented and approved ?NA ?Intravenous access site secured and place ?NA ?Valuables secured ?Linens and cotton and cotton/polyester blend (less than 51% polyester) ?Personal oil-based products / skin lotions / body lotions removed ?Wigs or hairpieces removed ?NA ?Smoking or tobacco materials removed ?NA ?Books / newspapers / magazines / loose paper removed ?NA ?Cologne, aftershave, perfume and deodorant removed ?Jewelry removed (may wrap wedding band) ?Make-up removed ?NA ?Hair care products removed ?Battery operated devices (external) removed ?NA ?Heating patches and chemical warmers removed ?NA ?Titanium eyewear removed ?NA ?Nail polish cured greater than 10 hours ?NA ?Casting material cured greater than 10 hours ?NA ?Hearing aids removed ?NA ?Loose dentures or partials removed ?NA ?Prosthetics have been removed ?NA ?Patient demonstrates correct use of air break device (if applicable) ?Patient concerns have been addressed ?Patient grounding bracelet on and cord attached to chamber ?Specifics for Inpatients (complete in addition to ?above) ?Medication sheet sent with  patient ?Intravenous medications needed or due during therapy sent with patient ?Drainage tubes (e.g. nasogastric tube or chest tube secured and vented) ?Endotracheal or Tracheotomy tube secured ?Cuff deflated of air and inflated with saline ?Airway suctioned ?Electronic Signature(s) ?Signed: 01/13/2022  1:20:57 PM By: Enedina Finner RCP, RRT, CHT ?Entered By: Enedina Finner on 01/13/2022 11:39:27 ?

## 2022-01-13 NOTE — Progress Notes (Signed)
[place], [place] ([id number]) ?Visit Report for 01/13/2022 ?Arrival Information Details ?Patient Name: [name], [name] ?Date of Service: 01/13/2022 10:30 AM ?Medical Record Number: [id number] ?Patient Account Number: [id number] ?Date of Birth/Sex: 11-05-1965 (56 y.o. M) ?Treating RN: Cornell Barman ?Primary Care Jaritza Duignan: [name] ?Other Clinician: Jacqulyn Bath ?Referring Sacha Radloff: [name] ?Treating Syenna Nazir/Extender: Jeri Cos ?Weeks in Treatment: 10 ?Visit Information History Since Last Visit ?Added or deleted any medications: No ?Patient Arrived: Ambulatory ?Any new allergies or adverse reactions: No ?Arrival Time: 10:15 ?Had a fall or experienced change in No ?Accompanied By: self ?activities of daily living that may affect ?Transfer Assistance: None ?risk of falls: ?Patient Identification Verified: Yes ?Signs or symptoms of abuse/neglect since last visito No ?Secondary Verification Process Completed: Yes ?Hospitalized since last visit: No ?Patient Requires Transmission-Based No ?Implantable device outside of the clinic excluding No ?Precautions: ?cellular tissue based products placed in the center ?Patient Has Alerts: Yes ?since last visit: ?Patient Alerts: Patient on Blood ?Pain Present Now: No ?Thinner ?DIALYSIS RIGHT ARM ?Aspirin 81/Brilinta ?Electronic Signature(s) ?Signed: 01/13/2022 1:20:57 PM By: Enedina Finner RCP, RRT, CHT ?Entered By: Enedina Finner on 01/13/2022 11:36:37 ?[place], [place] ([id number]) ?-------------------------------------------------------------------------------- ?Encounter Discharge Information Details ?Patient Name: [name], [name] ?Date of Service: 01/13/2022 10:30 AM ?Medical Record Number: [id number] ?Patient Account Number: [id number] ?Date of Birth/Sex: [birth year] (56 y.o. M) ?Treating RN: Cornell Barman ?Primary Care Costas Sena: [name] ?Other Clinician: Jacqulyn Bath ?Referring Javari Bufkin: [name] ?Treating Ameet Sandy/Extender: Jeri Cos ?Weeks in  Treatment: 10 ?Encounter Discharge Information Items ?Discharge Condition: Stable ?Ambulatory Status: Ambulatory ?Discharge Destination: Home ?Transportation: Private Auto ?Accompanied By: self ?Schedule Follow-up Appointment: Yes ?Clinical Summary of Care: ?Notes ?Patient has an HBO treatment scheduled on 01/14/22 at 08:00 am. ?Electronic Signature(s) ?Signed: 01/13/2022 1:20:57 PM By: Enedina Finner RCP, RRT, CHT ?Entered By: Enedina Finner on 01/13/2022 13:20:33 ?[place]hel, [place] ([id number]) ?-------------------------------------------------------------------------------- ?Vitals Details ?Patient Name: [name], [name] ?Date of Service: 01/13/2022 10:30 AM ?Medical Record Number: [id number] ?Patient Account Number: [id number] ?Date of Birth/Sex: 1965/11/28 (56 y.o. M) ?Treating RN: Cornell Barman ?Primary Care Kilee Hedding: [name] ?Other Clinician: Jacqulyn Bath ?Referring Doyl Bitting: [name] ?Treating Aashvi Rezabek/Extender: Jeri Cos ?Weeks in Treatment: 10 ?Vital Signs ?Time Taken: 10:19 ?Temperature (??F): 98.0 ?Height (in): 70 ?Pulse (bpm): 72 ?Weight (lbs): 180.4 ?Respiratory Rate (breaths/min): 16 ?Body Mass Index (BMI): 25.9 ?Blood Pressure (mmHg): 132/72 ?Capillary Blood Glucose (mg/dl): 154 ?Reference Range: 80 - 120 mg / dl ?Electronic Signature(s) ?Signed: 01/13/2022 1:20:57 PM By: Enedina Finner RCP, RRT, CHT ?Entered By: Enedina Finner on 01/13/2022 11:37:57 ?

## 2022-01-14 ENCOUNTER — Encounter: Payer: Medicare Other | Admitting: Physician Assistant

## 2022-01-14 DIAGNOSIS — I5042 Chronic combined systolic (congestive) and diastolic (congestive) heart failure: Secondary | ICD-10-CM | POA: Diagnosis not present

## 2022-01-14 DIAGNOSIS — I110 Hypertensive heart disease with heart failure: Secondary | ICD-10-CM | POA: Diagnosis not present

## 2022-01-14 DIAGNOSIS — L97524 Non-pressure chronic ulcer of other part of left foot with necrosis of bone: Secondary | ICD-10-CM | POA: Diagnosis not present

## 2022-01-14 DIAGNOSIS — E11621 Type 2 diabetes mellitus with foot ulcer: Secondary | ICD-10-CM | POA: Diagnosis not present

## 2022-01-14 DIAGNOSIS — M86672 Other chronic osteomyelitis, left ankle and foot: Secondary | ICD-10-CM | POA: Diagnosis not present

## 2022-01-14 DIAGNOSIS — I2510 Atherosclerotic heart disease of native coronary artery without angina pectoris: Secondary | ICD-10-CM | POA: Diagnosis not present

## 2022-01-14 LAB — GLUCOSE, CAPILLARY
Glucose-Capillary: 155 mg/dL — ABNORMAL HIGH (ref 70–99)
Glucose-Capillary: 166 mg/dL — ABNORMAL HIGH (ref 70–99)

## 2022-01-14 NOTE — Progress Notes (Signed)
[name], [name] ([id number]) ?Visit Report for 01/14/2022 ?HBO Details ?Patient Name: [name], [name] ?Date of Service: 01/14/2022 8:00 AM ?Medical Record Number: [id number] ?Patient Account Number: [id number] ?Date of Birth/Sex: 1966-05-22 (57 y.o. M) ?Treating RN: [name] ?Primary Care Kaijah Abts: [name] ?Other Clinician: Jacqulyn Bath ?Referring Leondre Taul: [name] ?Treating Curvin Hunger/Extender: Jeri Cos ?Weeks in Treatment: 10 ?HBO Treatment Course Details ?Treatment Course Number: 1 ?Ordering Juelz Claar: Jeri Cos ?Total Treatments Ordered: 60 ?HBO Treatment Start Date: 11/04/2021 ?HBO Indication: ?Chronic Refractory Osteomyelitis to Left Foot ?HBO Treatment Details ?Treatment Number: 9 ?Patient Type: Outpatient ?Chamber Type: Monoplace ?Chamber Serial #: [id number] ?Treatment Protocol: 2.0 ATA with 90 minutes oxygen, and no air breaks ?Treatment Details ?Compression Rate Down: 1.0 psi / minute ?De-Compression Rate Up: 1.5 psi / minute ?Compress Tx Pressure Air breaks and breathing periods Decompress Decompress ?Begins Reached (leave unused spaces blank) Begins Ends ?Chamber Pressure (ATA) 1 2 - - - - - - 2 1 ?Clock Time (24 hr) 08:24 08:40 - - - - - - 10:11 10:21 ?Treatment Length: 117 (minutes) ?Treatment Segments: 4 ?Vital Signs ?Capillary Blood Glucose Reference Range: 80 - 120 mg / dl ?HBO Diabetic Blood Glucose Intervention Range: <131 mg/dl or >249 mg/dl ?Time Vitals Blood Respiratory Capillary Blood Glucose Pulse Action ?Type: ?Pulse: Temperature: ?Taken: ?Pressure: ?Rate: ?Glucose (mg/dl): ?Meter #: Oximetry (%) Taken: ?Pre 07:58 132/72 72 16 98.5 155 1 none per protocol ?Post 10:26 126/72 72 16 98.7 166 1 none per protocol ?Treatment Response ?Treatment Toleration: Well ?Treatment Completion ?Treatment Completed without Adverse Event ?Status: ?Electronic Signature(s) ?Signed: 01/14/2022 11:22:10 AM By: Enedina Finner RCP, RRT, CHT ?Signed: 01/14/2022 4:21:45 PM By: Worthy Keeler  PA-C ?Entered By: Enedina Finner on 01/14/2022 11:12:04 ?[place], [place] ([id number]) ?-------------------------------------------------------------------------------- ?HBO Safety Checklist Details ?Patient Name: [name], [name] ?Date of Service: 01/14/2022 8:00 AM ?Medical Record Number: [id number] ?Patient Account Number: [id number] ?Date of Birth/Sex: [birth year] (56 y.o. M) ?Treating RN: [name] ?Primary Care Axtyn Woehler: [name] ?Other Clinician: Jacqulyn Bath ?Referring Meckenzie Balsley: [name] ?Treating Caswell Alvillar/Extender: Jeri Cos ?Weeks in Treatment: 10 ?HBO Safety Checklist Items ?Safety Checklist ?Consent Form Signed ?Patient voided / foley secured and emptied ?When did you last eato 07:00 am ?Last dose of injectable or oral agent n/a ?Ostomy pouch emptied and vented if applicable ?NA ?All implantable devices assessed, documented and approved ?NA ?Intravenous access site secured and place ?NA ?Valuables secured ?Linens and cotton and cotton/polyester blend (less than 51% polyester) ?Personal oil-based products / skin lotions / body lotions removed ?Wigs or hairpieces removed ?NA ?Smoking or tobacco materials removed ?NA ?Books / newspapers / magazines / loose paper removed ?NA ?Cologne, aftershave, perfume and deodorant removed ?Jewelry removed (may wrap wedding band) ?Make-up removed ?NA ?Hair care products removed ?Battery operated devices (external) removed ?NA ?Heating patches and chemical warmers removed ?NA ?Titanium eyewear removed ?NA ?Nail polish cured greater than 10 hours ?NA ?Casting material cured greater than 10 hours ?NA ?Hearing aids removed ?NA ?Loose dentures or partials removed ?NA ?Prosthetics have been removed ?NA ?Patient demonstrates correct use of air break device (if applicable) ?Patient concerns have been addressed ?Patient grounding bracelet on and cord attached to chamber ?Specifics for Inpatients (complete in addition to ?above) ?Medication sheet sent with  patient ?Intravenous medications needed or due during therapy sent with patient ?Drainage tubes (e.g. nasogastric tube or chest tube secured and vented) ?Endotracheal or Tracheotomy tube secured ?Cuff deflated of air and inflated with saline ?Airway suctioned ?Electronic Signature(s) ?Signed: 01/14/2022  11:22:10 AM By: Enedina Finner RCP, RRT, CHT ?Entered By: Enedina Finner on 01/14/2022 08:54:26 ?

## 2022-01-14 NOTE — Progress Notes (Signed)
[name], [name] ([id number]) ?Visit Report for 01/14/2022 ?Arrival Information Details ?Patient Name: [name], [name] ?Date of Service: 01/14/2022 8:00 AM ?Medical Record Number: [id number] ?Patient Account Number: [id number] ?Date of Birth/Sex: 1966/01/09 (56 y.o. M) ?Treating RN: [name] ?Primary Care Kamera Dubas: [name] ?Other Clinician: Jacqulyn Bath ?Referring Shelby Anderle: [name] ?Treating Delon Revelo/Extender: Jeri Cos ?Weeks in Treatment: 10 ?Visit Information History Since Last Visit ?Added or deleted any medications: No ?Patient Arrived: Ambulatory ?Any new allergies or adverse reactions: No ?Arrival Time: 07:57 ?Had a fall or experienced change in No ?Accompanied By: self ?activities of daily living that may affect ?Transfer Assistance: None ?risk of falls: ?Patient Identification Verified: Yes ?Signs or symptoms of abuse/neglect since last visito No ?Secondary Verification Process Completed: Yes ?Hospitalized since last visit: No ?Patient Requires Transmission-Based No ?Implantable device outside of the clinic excluding No ?Precautions: ?cellular tissue based products placed in the center ?Patient Has Alerts: Yes ?since last visit: ?Patient Alerts: Patient on Blood ?Has Dressing in Place as Prescribed: Yes ?Thinner ?Has Footwear/Offloading in Place as Prescribed: Yes ?DIALYSIS RIGHT ARM ?Left: Surgical Shoe with Pressure ?Aspirin 81/Brilinta ?Relief Insole ?Pain Present Now: No ?Electronic Signature(s) ?Signed: 01/14/2022 11:22:10 AM By: Enedina Finner RCP, RRT, CHT ?Entered By: Enedina Finner on 01/14/2022 08:52:19 ?[name], [name] ([id number]) ?-------------------------------------------------------------------------------- ?Encounter Discharge Information Details ?Patient Name: [name], [name] ?Date of Service: 01/14/2022 8:00 AM ?Medical Record Number: [id number] ?Patient Account Number: [id number] ?Date of Birth/Sex: 1966-09-28 (56 y.o. M) ?Treating RN: [name] ?Primary  Care Berma Harts: [name] ?Other Clinician: Jacqulyn Bath ?Referring Lecil Tapp: [name] ?Treating Ladonna Vanorder/Extender: Jeri Cos ?Weeks in Treatment: 10 ?Encounter Discharge Information Items ?Discharge Condition: Stable ?Ambulatory Status: Ambulatory ?Discharge Destination: Home ?Transportation: Private Auto ?Accompanied By: self ?Schedule Follow-up Appointment: Yes ?Clinical Summary of Care: ?Notes ?Patient has an HBO treatment scheduled on 01/15/22 at 10:30 am. ?Electronic Signature(s) ?Signed: 01/14/2022 11:22:10 AM By: Enedina Finner RCP, RRT, CHT ?Entered By: Enedina Finner on 01/14/2022 11:16:59 ?[name], [name] ([id number]) ?-------------------------------------------------------------------------------- ?Vitals Details ?Patient Name: [name], [name] ?Date of Service: 01/14/2022 8:00 AM ?Medical Record Number: [id number] ?Patient Account Number: [id number] ?Date of Birth/Sex: 11/04/1965 (56 y.o. M) ?Treating RN: [name] ?Primary Care Jakarie Pember: [name] ?Other Clinician: Jacqulyn Bath ?Referring Gina Leblond: [name] ?Treating Arrin Pintor/Extender: Jeri Cos ?Weeks in Treatment: 10 ?Vital Signs ?Time Taken: 07:58 ?Temperature (??F): 98.5 ?Height (in): 70 ?Pulse (bpm): 72 ?Weight (lbs): 180.4 ?Respiratory Rate (breaths/min): 16 ?Body Mass Index (BMI): 25.9 ?Blood Pressure (mmHg): 132/72 ?Capillary Blood Glucose (mg/dl): 155 ?Reference Range: 80 - 120 mg / dl ?Electronic Signature(s) ?Signed: 01/14/2022 11:22:10 AM By: Enedina Finner RCP, RRT, CHT ?Entered By: Enedina Finner on 01/14/2022 08:52:58 ?

## 2022-01-15 ENCOUNTER — Encounter (HOSPITAL_BASED_OUTPATIENT_CLINIC_OR_DEPARTMENT_OTHER): Payer: Medicare Other | Admitting: Internal Medicine

## 2022-01-15 ENCOUNTER — Other Ambulatory Visit: Payer: Self-pay

## 2022-01-15 DIAGNOSIS — I110 Hypertensive heart disease with heart failure: Secondary | ICD-10-CM | POA: Diagnosis not present

## 2022-01-15 DIAGNOSIS — N2581 Secondary hyperparathyroidism of renal origin: Secondary | ICD-10-CM | POA: Diagnosis not present

## 2022-01-15 DIAGNOSIS — E11621 Type 2 diabetes mellitus with foot ulcer: Secondary | ICD-10-CM

## 2022-01-15 DIAGNOSIS — L97524 Non-pressure chronic ulcer of other part of left foot with necrosis of bone: Secondary | ICD-10-CM | POA: Diagnosis not present

## 2022-01-15 DIAGNOSIS — M86672 Other chronic osteomyelitis, left ankle and foot: Secondary | ICD-10-CM

## 2022-01-15 DIAGNOSIS — I2510 Atherosclerotic heart disease of native coronary artery without angina pectoris: Secondary | ICD-10-CM | POA: Diagnosis not present

## 2022-01-15 DIAGNOSIS — D631 Anemia in chronic kidney disease: Secondary | ICD-10-CM | POA: Diagnosis not present

## 2022-01-15 DIAGNOSIS — I5042 Chronic combined systolic (congestive) and diastolic (congestive) heart failure: Secondary | ICD-10-CM | POA: Diagnosis not present

## 2022-01-15 DIAGNOSIS — N186 End stage renal disease: Secondary | ICD-10-CM | POA: Diagnosis not present

## 2022-01-15 DIAGNOSIS — Z992 Dependence on renal dialysis: Secondary | ICD-10-CM | POA: Diagnosis not present

## 2022-01-15 LAB — GLUCOSE, CAPILLARY
Glucose-Capillary: 252 mg/dL — ABNORMAL HIGH (ref 70–99)
Glucose-Capillary: 154 mg/dL — ABNORMAL HIGH (ref 70–99)

## 2022-01-15 NOTE — Progress Notes (Signed)
[place], [place] ([id number]) ?Visit Report for 01/15/2022 ?HBO Details ?Patient Name: [name], [name] ?Date of Service: 01/15/2022 10:30 AM ?Medical Record Number: [id number] ?Patient Account Number: [id number] ?Date of Birth/Sex: 17-Mar-1966 (56 y.o. M) ?Treating RN: [name] ?Primary Care Tildon Silveria: [name] ?Other Clinician: Jacqulyn Bath ?Referring Lucie Friedlander: [name] ?Treating Gailya Tauer/Extender: Kalman Shan ?Weeks in Treatment: 10 ?HBO Treatment Course Details ?Treatment Course Number: 1 ?Ordering Levin Dagostino: Jeri Cos ?Total Treatments Ordered: 60 ?HBO Treatment Start Date: 11/04/2021 ?HBO Indication: ?Chronic Refractory Osteomyelitis to Left Foot ?HBO Treatment Details ?Treatment Number: 28 ?Patient Type: Outpatient ?Chamber Type: Monoplace ?Chamber Serial #: [id number] ?Treatment Protocol: 2.0 ATA with 90 minutes oxygen, and no air breaks ?Treatment Details ?Compression Rate Down: 1.0 psi / minute ?De-Compression Rate Up: 1.5 psi / minute ?Compress Tx Pressure Air breaks and breathing periods Decompress Decompress ?Begins Reached (leave unused spaces blank) Begins Ends ?Chamber Pressure (ATA) 1 2 - - - - - - 2 1 ?Clock Time (24 hr) 10:37 10:54 - - - - - - 12:25 12:35 ?Treatment Length: 118 (minutes) ?Treatment Segments: 4 ?Vital Signs ?Capillary Blood Glucose Reference Range: 80 - 120 mg / dl ?HBO Diabetic Blood Glucose Intervention Range: <131 mg/dl or >249 mg/dl ?Time Vitals Blood Respiratory Capillary Blood Glucose Pulse Action ?Type: ?Pulse: Temperature: ?Taken: ?Pressure: ?Rate: ?Glucose (mg/dl): ?Meter #: Oximetry (%) Taken: ?Pre 10:23 132/78 72 16 98.5 252 1 none per protocol ?Post 12:39 132/78 72 16 98.5 154 1 none per protocol ?Treatment Response ?Treatment Toleration: Well ?Treatment Completion ?Treatment Completed without Adverse Event ?Status: ?HBO Attestation ?I certify that I supervised this HBO treatment in accordance with ?Medicare guidelines. A trained emergency response team is  readily Yes ?available per hospital policies and procedures. ?Continue HBOT as ordered. Yes ?Electronic Signature(s) ?Signed: 01/15/2022 4:15:51 PM By: Kalman Shan DO ?Previous Signature: 01/15/2022 1:11:57 PM Version By: Enedina Finner RCP, RRT, CHT ?Entered By: Kalman Shan on 01/15/2022 16:14:34 ?[place], [place] ([id number]) ?-------------------------------------------------------------------------------- ?HBO Safety Checklist Details ?Patient Name: [name], [name] ?Date of Service: 01/15/2022 10:30 AM ?Medical Record Number: [id number] ?Patient Account Number: [id number] ?Date of Birth/Sex: 02/21/1966 (56 y.o. M) ?Treating RN: [name] ?Primary Care Letonia Stead: [name] ?Other Clinician: Jacqulyn Bath ?Referring Chandni Gagan: [name] ?Treating Juandaniel Manfredo/Extender: Kalman Shan ?Weeks in Treatment: 10 ?HBO Safety Checklist Items ?Safety Checklist ?Consent Form Signed ?Patient voided / foley secured and emptied ?When did you last eato 10:00 am ?Last dose of injectable or oral agent n/a ?Ostomy pouch emptied and vented if applicable ?NA ?All implantable devices assessed, documented and approved ?NA ?Intravenous access site secured and place ?NA ?Valuables secured ?Linens and cotton and cotton/polyester blend (less than 51% polyester) ?Personal oil-based products / skin lotions / body lotions removed ?Wigs or hairpieces removed ?NA ?Smoking or tobacco materials removed ?NA ?Books / newspapers / magazines / loose paper removed ?NA ?Cologne, aftershave, perfume and deodorant removed ?Jewelry removed (may wrap wedding band) ?Make-up removed ?NA ?Hair care products removed ?Battery operated devices (external) removed ?NA ?Heating patches and chemical warmers removed ?NA ?Titanium eyewear removed ?NA ?Nail polish cured greater than 10 hours ?NA ?Casting material cured greater than 10 hours ?NA ?Hearing aids removed ?NA ?Loose dentures or partials removed ?NA ?Prosthetics have been  removed ?NA ?Patient demonstrates correct use of air break device (if applicable) ?Patient concerns have been addressed ?Patient grounding bracelet on and cord attached to chamber ?Specifics for Inpatients (complete in addition to ?above) ?Medication sheet sent with patient ?Intravenous medications needed or due during  therapy sent with patient ?Drainage tubes (e.g. nasogastric tube or chest tube secured and vented) ?Endotracheal or Tracheotomy tube secured ?Cuff deflated of air and inflated with saline ?Airway suctioned ?Electronic Signature(s) ?Signed: 01/15/2022 1:11:57 PM By: Enedina Finner RCP, RRT, CHT ?Entered By: Enedina Finner on 01/15/2022 10:53:58 ?

## 2022-01-15 NOTE — Progress Notes (Signed)
[place], [place] ([id number]) ?Visit Report for 01/15/2022 ?Arrival Information Details ?Patient Name: [name], [name] ?Date of Service: 01/15/2022 10:30 AM ?Medical Record Number: [id number] ?Patient Account Number: [id number] ?Date of Birth/Sex: Nov 20, 1965 (56 y.o. M) ?Treating RN: [name] ?Primary Care Lakynn Halvorsen: [name] ?Other Clinician: Jacqulyn Bath ?Referring Saumya Hukill: [name] ?Treating Marah Park/Extender: Kalman Shan ?Weeks in Treatment: 10 ?Visit Information History Since Last Visit ?Added or deleted any medications: No ?Patient Arrived: Ambulatory ?Any new allergies or adverse reactions: No ?Arrival Time: 10:16 ?Had a fall or experienced change in No ?Accompanied By: self ?activities of daily living that may affect ?Transfer Assistance: None ?risk of falls: ?Patient Identification Verified: Yes ?Signs or symptoms of abuse/neglect since last visito No ?Secondary Verification Process Completed: Yes ?Hospitalized since last visit: No ?Patient Requires Transmission-Based No ?Implantable device outside of the clinic excluding No ?Precautions: ?cellular tissue based products placed in the center ?Patient Has Alerts: Yes ?since last visit: ?Patient Alerts: Patient on Blood ?Has Dressing in Place as Prescribed: Yes ?Thinner ?Has Footwear/Offloading in Place as Prescribed: Yes ?DIALYSIS RIGHT ARM ?Left: Surgical Shoe with Pressure ?Aspirin 81/Brilinta ?Relief Insole ?Pain Present Now: No ?Electronic Signature(s) ?Signed: 01/15/2022 1:11:57 PM By: Enedina Finner RCP, RRT, CHT ?Entered By: Enedina Finner on 01/15/2022 10:52:20 ?[place], [place] ([id number]) ?-------------------------------------------------------------------------------- ?Encounter Discharge Information Details ?Patient Name: [name], [name] ?Date of Service: 01/15/2022 10:30 AM ?Medical Record Number: [id number] ?Patient Account Number: [id number] ?Date of Birth/Sex: [birth year] (56 y.o. M) ?Treating RN: [name] ?Primary Care Jerome Viglione: [name] ?Other Clinician: Jacqulyn Bath ?Referring Sotirios Navarro: [name] ?Treating Gedeon Brandow/Extender: Kalman Shan ?Weeks in Treatment: 10 ?Encounter Discharge Information Items ?Discharge Condition: Stable ?Ambulatory Status: Ambulatory ?Discharge Destination: Home ?Transportation: Private Auto ?Accompanied By: self ?Schedule Follow-up Appointment: Yes ?Clinical Summary of Care: ?Notes ?Patient has an HBO treatment scheduled on 01/16/22 at 08:00 am. ?Electronic Signature(s) ?Signed: 01/15/2022 1:11:57 PM By: Enedina Finner RCP, RRT, CHT ?Entered By: Enedina Finner on 01/15/2022 13:11:26 ?[place], [place] ([id number]) ?-------------------------------------------------------------------------------- ?Vitals Details ?Patient Name: [name], [name] ?Date of Service: 01/15/2022 10:30 AM ?Medical Record Number: [id number] ?Patient Account Number: [id number] ?Date of Birth/Sex: 04/05/1966 (56 y.o. M) ?Treating RN: [name] ?Primary Care Glynn Yepes: [name] ?Other Clinician: Jacqulyn Bath ?Referring Mclean Moya: [name] ?Treating Bartow Zylstra/Extender: Kalman Shan ?Weeks in Treatment: 10 ?Vital Signs ?Time Taken: 10:23 ?Temperature (??F): 98.5 ?Height (in): 70 ?Pulse (bpm): 72 ?Weight (lbs): 180.4 ?Respiratory Rate (breaths/min): 16 ?Body Mass Index (BMI): 25.9 ?Blood Pressure (mmHg): 132/78 ?Capillary Blood Glucose (mg/dl): 252 ?Reference Range: 80 - 120 mg / dl ?Electronic Signature(s) ?Signed: 01/15/2022 1:11:57 PM By: Enedina Finner RCP, RRT, CHT ?Entered By: Enedina Finner on 01/15/2022 10:53:00 ?

## 2022-01-16 ENCOUNTER — Encounter: Payer: Medicare Other | Admitting: Physician Assistant

## 2022-01-16 DIAGNOSIS — I2510 Atherosclerotic heart disease of native coronary artery without angina pectoris: Secondary | ICD-10-CM | POA: Diagnosis not present

## 2022-01-16 DIAGNOSIS — E11621 Type 2 diabetes mellitus with foot ulcer: Secondary | ICD-10-CM | POA: Diagnosis not present

## 2022-01-16 DIAGNOSIS — L97524 Non-pressure chronic ulcer of other part of left foot with necrosis of bone: Secondary | ICD-10-CM | POA: Diagnosis not present

## 2022-01-16 DIAGNOSIS — I5042 Chronic combined systolic (congestive) and diastolic (congestive) heart failure: Secondary | ICD-10-CM | POA: Diagnosis not present

## 2022-01-16 DIAGNOSIS — I110 Hypertensive heart disease with heart failure: Secondary | ICD-10-CM | POA: Diagnosis not present

## 2022-01-16 DIAGNOSIS — M86672 Other chronic osteomyelitis, left ankle and foot: Secondary | ICD-10-CM | POA: Diagnosis not present

## 2022-01-16 LAB — GLUCOSE, CAPILLARY
Glucose-Capillary: 167 mg/dL — ABNORMAL HIGH (ref 70–99)
Glucose-Capillary: 181 mg/dL — ABNORMAL HIGH (ref 70–99)

## 2022-01-17 ENCOUNTER — Other Ambulatory Visit
Admission: RE | Admit: 2022-01-17 | Discharge: 2022-01-17 | Disposition: A | Discharge status: Home / Self Care | Payer: Medicare Other | Source: Ambulatory Visit | Attending: Physician Assistant | Admitting: Physician Assistant

## 2022-01-17 ENCOUNTER — Encounter: Payer: Medicare Other | Admitting: Physician Assistant

## 2022-01-17 DIAGNOSIS — N186 End stage renal disease: Secondary | ICD-10-CM | POA: Diagnosis not present

## 2022-01-17 DIAGNOSIS — N2581 Secondary hyperparathyroidism of renal origin: Secondary | ICD-10-CM | POA: Diagnosis not present

## 2022-01-17 DIAGNOSIS — I2510 Atherosclerotic heart disease of native coronary artery without angina pectoris: Secondary | ICD-10-CM | POA: Diagnosis not present

## 2022-01-17 DIAGNOSIS — Z4781 Encounter for orthopedic aftercare following surgical amputation: Secondary | ICD-10-CM | POA: Diagnosis not present

## 2022-01-17 DIAGNOSIS — T8189XA Other complications of procedures, not elsewhere classified, initial encounter: Secondary | ICD-10-CM | POA: Diagnosis not present

## 2022-01-17 DIAGNOSIS — D631 Anemia in chronic kidney disease: Secondary | ICD-10-CM | POA: Diagnosis not present

## 2022-01-17 DIAGNOSIS — M86672 Other chronic osteomyelitis, left ankle and foot: Secondary | ICD-10-CM | POA: Diagnosis not present

## 2022-01-17 DIAGNOSIS — L03116 Cellulitis of left lower limb: Secondary | ICD-10-CM | POA: Insufficient documentation

## 2022-01-17 DIAGNOSIS — L97512 Non-pressure chronic ulcer of other part of right foot with fat layer exposed: Secondary | ICD-10-CM | POA: Diagnosis not present

## 2022-01-17 DIAGNOSIS — L97524 Non-pressure chronic ulcer of other part of left foot with necrosis of bone: Secondary | ICD-10-CM | POA: Diagnosis not present

## 2022-01-17 DIAGNOSIS — B999 Unspecified infectious disease: Secondary | ICD-10-CM | POA: Insufficient documentation

## 2022-01-17 DIAGNOSIS — I5042 Chronic combined systolic (congestive) and diastolic (congestive) heart failure: Secondary | ICD-10-CM | POA: Diagnosis not present

## 2022-01-17 DIAGNOSIS — E11621 Type 2 diabetes mellitus with foot ulcer: Secondary | ICD-10-CM | POA: Diagnosis not present

## 2022-01-17 DIAGNOSIS — E1151 Type 2 diabetes mellitus with diabetic peripheral angiopathy without gangrene: Secondary | ICD-10-CM | POA: Diagnosis not present

## 2022-01-17 DIAGNOSIS — Z992 Dependence on renal dialysis: Secondary | ICD-10-CM | POA: Diagnosis not present

## 2022-01-17 DIAGNOSIS — I110 Hypertensive heart disease with heart failure: Secondary | ICD-10-CM | POA: Diagnosis not present

## 2022-01-17 LAB — GLUCOSE, CAPILLARY
Glucose-Capillary: 183 mg/dL — ABNORMAL HIGH (ref 70–99)
Glucose-Capillary: 146 mg/dL — ABNORMAL HIGH (ref 70–99)

## 2022-01-17 NOTE — Progress Notes (Addendum)
[name] ([id number]) ?Visit Report for 01/17/2022 ?Chief Complaint Document Details ?Patient Name: [name], [name] ?Date of Service: 01/17/2022 1:15 PM ?Medical Record Number: [id number] ?Patient Account Number: [id number] ?Date of Birth/Sex: 06-10-1966 (56 y.o. M) ?Treating RN: [name] ?Primary Care Provider: [name] Other Clinician: ?Referring Provider: [name] ?Treating Provider/Extender: Jeri Cos ?Weeks in Treatment: 11 ?Information Obtained from: Patient ?Chief Complaint ?HBO evaluation for treatment on the left foot diabetic ulcer with osteomyelitis. Patient is currently under our care as well for his foot. ?Electronic Signature(s) ?Signed: 01/17/2022 1:17:48 PM By: Worthy Keeler PA-C ?Entered By: Worthy Keeler on 01/17/2022 13:17:48 ?[name] ([id number]) ?-------------------------------------------------------------------------------- ?Debridement Details ?Patient Name: [name], [name] ?Date of Service: 01/17/2022 1:15 PM ?Medical Record Number: [id number] ?Patient Account Number: [id number] ?Date of Birth/Sex: 1966/09/26 (56 y.o. M) ?Treating RN: [name] ?Primary Care Provider: [name] Other Clinician: ?Referring Provider: [name] ?Treating Provider/Extender: Jeri Cos ?Weeks in Treatment: 11 ?Debridement Performed for ?Wound #1 Left Amputation Site - Digit ?Assessment: ?Performed By: Physician Tommie Sams., PA-C ?Debridement Type: Debridement ?Level of Consciousness (Pre- ?Awake and Alert ?procedure): ?Pre-procedure Verification/Time Out ?Yes - 13:22 ?Taken: ?Start Time: 13:22 ?Pain Control: Lidocaine 4% Topical Solution ?Total Area Debrided (L x W): 2 (cm) x 2 (cm) = 4 (cm?) ?Tissue and other material ?Viable, Non-Viable, Slough, Subcutaneous, Skin: Dermis , Skin: Epidermis, Slough ?debrided: ?Level: Skin/Subcutaneous Tissue ?Debridement Description: Excisional ?Instrument: Curette ?Bleeding: Moderate ?Hemostasis Achieved: Silver Nitrate ?End Time:  13:26 ?Procedural Pain: 0 ?Post Procedural Pain: 0 ?Response to Treatment: Procedure was tolerated well ?Level of Consciousness (Post- ?Awake and Alert ?procedure): ?Post Debridement Measurements of Total Wound ?Length: (cm) 2 ?Width: (cm) 4 ?Depth: (cm) 1.2 ?Volume: (cm?) 7.54 ?Character of Wound/Ulcer Post Debridement: Improved ?Post Procedure Diagnosis ?Same as Pre-procedure ?Electronic Signature(s) ?Signed: 01/17/2022 4:01:15 PM By: [name] RN ?Signed: 01/17/2022 4:34:33 PM By: Worthy Keeler PA-C ?Entered By: [name] on 01/17/2022 13:34:58 ?[name] ([id number]) ?-------------------------------------------------------------------------------- ?HPI Details ?Patient Name: [name], [name] ?Date of Service: 01/17/2022 1:15 PM ?Medical Record Number: [id number] ?Patient Account Number: [id number] ?Date of Birth/Sex: [birth year] (56 y.o. M) ?Treating RN: [name] ?Primary Care Provider: [name] Other Clinician: ?Referring Provider: [name] ?Treating Provider/Extender: Jeri Cos ?Weeks in Treatment: 11 ?History of Present Illness ?HPI Description: 10/31/2021 patient presents today for evaluation here in our clinic. He is actually being evaluated for hyperbaric oxygen therapy ?only. He has been seen at [facility] up to this point he is under the care of the vascular surgery specialty. Subsequently he is also ?under the care of the hyperbaric center there. With that being said that due to the fact that he is a dialysis patient he was actually recommended ?to be transferred to [place] for his treatments he actually lives in [place] which is where his dialysis is. It was a hardship for him to try to get from ?[place] especially on dialysis days all the way to [facility] get into the facility for his treatment and get back home he was literally spending ?all day long. He has had just 4 treatments there at [facility] thus far based on what I see in these have not been continued while he awaits  approval ?here in our clinic. Subsequently I do have records for review that will be included in the HPI predetermination review and attached to this chart as ?well. I will not duplicate that here. 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 Nonetheless he continues to have exposed bone with ?marked soft tissue swelling compatible with osteomyelitis. This is due to the irregularity of the head of the metatarsal as well. Nonetheless along ?with having associated cellulitis he is also good to be seen by infectious disease and is currently on antibiotics for this as well. Again that will be ?detailed in the pretreatment review section which I will attach to this note as well. He does currently have a wound VAC in place and again the ?wound was not evaluated here in the clinic as that is being completely managed by home health and [facility] ?The patient does have a history of diabetes mellitus type 2, hypertension, coronary artery disease, and congestive heart failure. He also has ?cataracts of both eyes but no evidence of glaucoma on his most recent eye exam. ?11/22/2021 we have been seeing this gentleman for hyperbaric oxygen therapy but have not actually evaluated his wound up to this point this was ?being managed by the wound care and vascular team I presumed at [facility] at least that is what has been told to be previous. Nonetheless at this ?point I got a call from Atlee Abide who is a Librarian, academic at the [facility] vascular clinic with Dr. Durene Fruits and subsequently they were wanting ?to know if I could take over wound care for this patient. Apparently they do not have the ability for an office debridement which again I completely ?understand but nonetheless definitely is not an integral part of his healing along with the hyperbarics. Subsequently I discussed  with the patient ?today that I do believe he would benefit going ahead and proceeding with evaluation here in the clinic for that reason I did go ahead and see him ?today to get things started. There is also been some confusion about the issues with his home health nurse and getting measurements to [facility] for ?the wound VAC in order to continue his wound VAC therapy. With that being said after I see him today we will make a determination on what to ?do with wound VAC we can definitely send measurements to [facility] but again the bigger question is whether this is the best way to go or not. ?11/28/2021 upon evaluation today patient appears to be doing decently well in regard to his wound. He has been tolerating the dressing changes ?with the Dakin's moistened gauze dressing which I think is doing a good job. Fortunately I do not see any signs of active infection locally nor ?systemically at this point. In general I think that he is actually making excellent progress which is great news. ?2/17; seen in conjunction with HBO today. 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 Much improved condition ?of the wound surface. On the lateral foot and eschared area that he has been using Betadine. Tolerating HBO well ?12/13/2021 upon evaluation today patient appears to be doing pretty well in regard to his foot ulcer. I am actually much more pleased currently ?that I been with the way the appearance looks today. The wound bed does show some signs of slough and biofilm on the surface of the wound ?this is minimal I Minna perform debridement the I will do so very carefully due to the fact that to be honest the patient had a significant amount of ?bleeding in the past being on the  Brilinta he had a difficult time clotting when I debrided him previously quite significantly. Fortunately I do not ?see any signs of active infection at this time which is great news. No fevers, chills, nausea, vomiting, or  diarrhea. ?12/20/2021 upon evaluation today patient actually appears to be showing some signs of excellent improvement here. I am very pleased with where ?things stand currently. There does not appear to be any eviden

## 2022-01-17 NOTE — Progress Notes (Signed)
Bertram, [place] ([id number]) ?Visit Report for 01/17/2022 ?Arrival Information Details ?Patient Name: [name], [name] ?Date of Service: 01/17/2022 1:15 PM ?Medical Record Number: [id number] ?Patient Account Number: [id number] ?Date of Birth/Sex: April 13, 1966 (56 y.o. M) ?Treating RN: Cornell Barman ?Primary Care Niel Peretti: [name] Other Clinician: ?Referring Alashia Brownfield: [name] ?Treating Nyazia Canevari/Extender: Jeri Cos ?Weeks in Treatment: 11 ?Visit Information History Since Last Visit ?Added or deleted any medications: No ?Patient Arrived: Ambulatory ?Has Dressing in Place as Prescribed: Yes ?Arrival Time: 13:04 ?Has Footwear/Offloading in Place as Prescribed: Yes ?Accompanied By: self ?Left: Surgical Shoe with Pressure Relief ?Transfer Assistance: None ?Insole ?Patient Identification Verified: Yes ?Pain Present Now: No ?Secondary Verification Process Completed: Yes ?Patient Requires Transmission-Based No ?Precautions: ?Patient Has Alerts: Yes ?Patient Alerts: Patient on Blood ?Thinner ?DIALYSIS RIGHT ARM ?Aspirin 81/Brilinta ?Electronic Signature(s) ?Signed: 01/17/2022 4:29:09 PM By: Gretta Cool, BSN, RN, CWS, Kim RN, BSN ?Entered By: Gretta Cool, BSN, RN, CWS, Kim on 01/17/2022 13:04:47 ?Steinhaus, Walker ([id number]) ?-------------------------------------------------------------------------------- ?Clinic Level of Care Assessment Details ?Patient Name: [name], [name] ?Date of Service: 01/17/2022 1:15 PM ?Medical Record Number: [id number] ?Patient Account Number: [id number] ?Date of Birth/Sex: 07-07-1966 (56 y.o. M) ?Treating RN: [name] ?Primary Care Darcee Dekker: [name] Other Clinician: ?Referring Lavern Maslow: [name] ?Treating Romeka Scifres/Extender: Jeri Cos ?Weeks in Treatment: 11 ?Clinic Level of Care Assessment Items ?TOOL 1 Quantity Score ?[]  - Use when EandM and Procedure is performed on INITIAL visit 0 ?ASSESSMENTS - Nursing Assessment / Reassessment ?[]  - General Physical Exam (combine w/ comprehensive assessment  (listed just below) when performed on new ?0 ?pt. evals) ?[]  - 0 ?Comprehensive Assessment (HX, ROS, Risk Assessments, Wounds Hx, etc.) ?ASSESSMENTS - Wound and Skin Assessment / Reassessment ?[]  - Dermatologic / Skin Assessment (not related to wound area) 0 ?ASSESSMENTS - Ostomy and/or Continence Assessment and Care ?[]  - Incontinence Assessment and Management 0 ?[]  - 0 ?Ostomy Care Assessment and Management (repouching, etc.) ?PROCESS - Coordination of Care ?[]  - Simple Patient / Family Education for ongoing care 0 ?[]  - 0 ?Complex (extensive) Patient / Family Education for ongoing care ?[]  - 0 ?Staff obtains Consents, Records, Test Results / Process Orders ?[]  - 0 ?Staff telephones HHA, Nursing Homes / Clarify orders / etc ?[]  - 0 ?Routine Transfer to another Facility (non-emergent condition) ?[]  - 0 ?Routine Hospital Admission (non-emergent condition) ?[]  - 0 ?New Admissions / Biomedical engineer / Ordering NPWT, Apligraf, etc. ?[]  - 0 ?Emergency Hospital Admission (emergent condition) ?PROCESS - Special Needs ?[]  - Pediatric / Minor Patient Management 0 ?[]  - 0 ?Isolation Patient Management ?[]  - 0 ?Hearing / Language / Visual special needs ?[]  - 0 ?Assessment of Community assistance (transportation, D/C planning, etc.) ?[]  - 0 ?Additional assistance / Altered mentation ?[]  - 0 ?Support Surface(s) Assessment (bed, cushion, seat, etc.) ?INTERVENTIONS - Miscellaneous ?[]  - External ear exam 0 ?[]  - 0 ?Patient Transfer (multiple staff / Civil Service fast streamer / Similar devices) ?[]  - 0 ?Simple Staple / Suture removal (25 or less) ?[]  - 0 ?Complex Staple / Suture removal (26 or more) ?[]  - 0 ?Hypo/Hyperglycemic Management (do not check if billed separately) ?[]  - 0 ?Ankle / Brachial Index (ABI) - do not check if billed separately ?Has the patient been seen at the hospital within the last three years: Yes ?Total Score: 0 ?Level Of Care: ____ ?Rubert, [place] ([id number]) ?Electronic Signature(s) ?Signed: 01/17/2022 4:01:15  PM By: [name] RN ?Entered By: [name] on 01/17/2022 13:24:56 ?Bandel, [place] ([id number]) ?-------------------------------------------------------------------------------- ?Encounter Discharge Information Details ?Patient  Name: [name], [name] ?Date of Service: 01/17/2022 1:15 PM ?Medical Record Number: [id number] ?Patient Account Number: [id number] ?Date of Birth/Sex: 1966-09-22 (56 y.o. M) ?Treating RN: [name] ?Primary Care [place] Sigmund: [name] Other Clinician: ?Referring Matthe Sloane: [name] ?Treating Chayne Baumgart/Extender: Jeri Cos ?Weeks in Treatment: 11 ?Encounter Discharge Information Items Post Procedure Vitals ?Discharge Condition: Stable ?Temperature (?F): 98 ?Ambulatory Status: Ambulatory ?Pulse (bpm): 78 ?Discharge Destination: Home ?Respiratory Rate (breaths/min): 16 ?Transportation: Private Auto ?Blood Pressure (mmHg): 128/74 ?Accompanied By: self ?Schedule Follow-up Appointment: Yes ?Clinical Summary of Care: Patient Declined ?Electronic Signature(s) ?Signed: 01/17/2022 4:01:15 PM By: [name] RN ?Entered By: [name] on 01/17/2022 13:27:03 ?Donovan, [place] ([id number]) ?-------------------------------------------------------------------------------- ?Lower Extremity Assessment Details ?Patient Name: [name], [name] ?Date of Service: 01/17/2022 1:15 PM ?Medical Record Number: [id number] ?Patient Account Number: [id number] ?Date of Birth/Sex: [birth year] (56 y.o. M) ?Treating RN: Cornell Barman ?Primary Care Courtenay Hirth: [name] Other Clinician: ?Referring Jalana Moore: [name] ?Treating Caroleann Casler/Extender: Jeri Cos ?Weeks in Treatment: 11 ?Vascular Assessment ?Pulses: ?Dorsalis Pedis ?Palpable: [Left:Yes] ?Electronic Signature(s) ?Signed: 01/17/2022 4:29:09 PM By: Gretta Cool, BSN, RN, CWS, Kim RN, BSN ?Entered By: Gretta Cool, BSN, RN, CWS, Kim on 01/17/2022 13:17:48 ?Gisler, [place] ([id number]) ?-------------------------------------------------------------------------------- ?Multi Wound  Chart Details ?Patient Name: [name], [name] ?Date of Service: 01/17/2022 1:15 PM ?Medical Record Number: [id number] ?Patient Account Number: [id number] ?Date of Birth/Sex: [birth year] (56 y.o. M) ?Treating RN: [name] ?Primary Care Treyvone Chelf: [name] Other Clinician: ?Referring Aidric Endicott: [name] ?Treating Jac Romulus/Extender: Jeri Cos ?Weeks in Treatment: 11 ?Vital Signs ?Height(in): 70 ?Pulse(bpm): 78 ?Weight(lbs): 180.4 ?Blood Pressure(mmHg): 128/74 ?Body Mass Index(BMI): 25.9 ?Temperature(??F): 98 ?Respiratory Rate(breaths/min): 16 ?Photos: [N/A:N/A] ?Wound Location: Left Amputation Site - Digit Left, Lateral Foot N/A ?Wounding Event: Surgical Injury Blister N/A ?Primary Etiology: Open Surgical Wound Diabetic Wound/Ulcer of the Lower N/A ?Extremity ?Comorbid History: Cataracts, Anemia, Sleep Apnea, Cataracts, Anemia, Sleep Apnea, N/A ?Arrhythmia, Congestive Heart Arrhythmia, Congestive Heart ?Failure, Coronary Artery Disease, Failure, Coronary Artery Disease, ?Hypertension, Myocardial Infarction, Hypertension, Myocardial Infarction, ?Type II Diabetes, Neuropathy, Type II Diabetes, Neuropathy, ?Seizure Disorder Seizure Disorder ?Date Acquired: 09/09/2021 10/28/2021 N/A ?Weeks of Treatment: 8 7 N/A ?Wound Status: Open Open N/A ?Wound Recurrence: No No N/A ?Measurements L x W x D (cm) 2x4x1.2 3.2x2x0.9 N/A ?Area (cm?) : 6.283 5.027 N/A ?Volume (cm?) : 7.54 4.524 N/A ?% Reduction in Area: 55.60% -60.00% N/A ?% Reduction in Volume: 81.60% -1340.80% N/A ?Position 1 (o'clock): 10 6 ?Maximum Distance 1 (cm): 1.2 1.3 ?Tunneling: Yes Yes N/A ?Classification: Full Thickness With Exposed Grade 2 N/A ?Support Structures ?Exudate Amount: Medium Medium N/A ?Exudate Type: Serous Serosanguineous N/A ?Exudate Color: amber red, brown N/A ?Granulation Amount: Medium (34-66%) None Present (0%) N/A ?Granulation Quality: Red N/A N/A ?Necrotic Amount: Medium (34-66%) Large (67-100%) N/A ?Necrotic Tissue: Adherent BarnwellExposed Structures: ?Fascia: Yes ?Fascia: No N/A ?Fat Layer (Subcutaneous Tissue): Fat Layer (Subcutaneous Tissue): ?Yes No ?Muscle: Yes ?Tendon: No ?Tendon: No ?Muscle: No ?Denice Paradise

## 2022-01-17 NOTE — Progress Notes (Signed)
Perezgarcia, [place] ([id number]) ?Visit Report for 01/16/2022 ?Arrival Information Details ?Patient Name: [name], [name] ?Date of Service: 01/16/2022 8:00 AM ?Medical Record Number: [id number] ?Patient Account Number: [id number] ?Date of Birth/Sex: 1966-02-20 (56 y.o. M) ?Treating RN: [name] ?Primary Care Petar Mucci: [name] ?Other Clinician: Jacqulyn Bath ?Referring Dandre Sisler: [name] ?Treating Makaelyn Aponte/Extender: Jeri Cos ?Weeks in Treatment: 11 ?Visit Information History Since Last Visit ?Added or deleted any medications: No ?Patient Arrived: Ambulatory ?Any new allergies or adverse reactions: No ?Arrival Time: 07:55 ?Had a fall or experienced change in No ?Accompanied By: self ?activities of daily living that may affect ?Transfer Assistance: None ?risk of falls: ?Patient Identification Verified: Yes ?Signs or symptoms of abuse/neglect since last visito No ?Secondary Verification Process Completed: Yes ?Hospitalized since last visit: No ?Patient Requires Transmission-Based No ?Implantable device outside of the clinic excluding No ?Precautions: ?cellular tissue based products placed in the center ?Patient Has Alerts: Yes ?since last visit: ?Patient Alerts: Patient on Blood ?Pain Present Now: No ?Thinner ?DIALYSIS RIGHT ARM ?Aspirin 81/Brilinta ?Electronic Signature(s) ?Signed: 01/17/2022 1:08:27 PM By: Enedina Finner RCP, RRT, CHT ?Entered By: Enedina Finner on 01/16/2022 08:53:52 ?Clapper, [place] ([id number]) ?-------------------------------------------------------------------------------- ?Encounter Discharge Information Details ?Patient Name: [name], [name] ?Date of Service: 01/16/2022 8:00 AM ?Medical Record Number: [id number] ?Patient Account Number: [id number] ?Date of Birth/Sex: March 21, 1966 (56 y.o. M) ?Treating RN: [name] ?Primary Care Ethel [name]: [name] ?Other Clinician: Jacqulyn Bath ?Referring Lak[place] Markgraf: [name] ?Treating Shynia Daleo/Extender: Jeri Cos ?Weeks  in Treatment: 11 ?Encounter Discharge Information Items ?Discharge Condition: Stable ?Ambulatory Status: Ambulatory ?Discharge Destination: Home ?Transportation: Private Auto ?Accompanied By: self ?Schedule Follow-up Appointment: Yes ?Clinical Summary of Care: ?Notes ?Patient has an HBO treatment scheduled on 01/17/22 at 10:30 am. ?Electronic Signature(s) ?Signed: 01/17/2022 1:08:27 PM By: Enedina Finner RCP, RRT, CHT ?Entered By: Enedina Finner on 01/16/2022 11:44:30 ?Harbach, [place] ([id number]) ?-------------------------------------------------------------------------------- ?Vitals Details ?Patient Name: [name], [name] ?Date of Service: 01/16/2022 8:00 AM ?Medical Record Number: [id number] ?Patient Account Number: [id number] ?Date of Birth/Sex: 08-04-1966 (56 y.o. M) ?Treating RN: [name] ?Primary Care Damiah Mcdonald: [name] ?Other Clinician: Jacqulyn Bath ?Referring Tyniah Kastens: [name] ?Treating Geralda Baumgardner/Extender: Jeri Cos ?Weeks in Treatment: 11 ?Vital Signs ?Time Taken: 08:02 ?Temperature (??F): 98.1 ?Height (in): 70 ?Pulse (bpm): 72 ?Weight (lbs): 180.4 ?Respiratory Rate (breaths/min): 16 ?Body Mass Index (BMI): 25.9 ?Blood Pressure (mmHg): 132/76 ?Capillary Blood Glucose (mg/dl): 167 ?Reference Range: 80 - 120 mg / dl ?Electronic Signature(s) ?Signed: 01/17/2022 1:08:27 PM By: Enedina Finner RCP, RRT, CHT ?Entered By: Enedina Finner on 01/16/2022 08:57:08 ?

## 2022-01-17 NOTE — Progress Notes (Signed)
Vanetten, O'Brien ([id number]) ?Visit Report for 01/17/2022 ?Arrival Information Details ?Patient Name: [name], [name] ?Date of Service: 01/17/2022 10:30 AM ?Medical Record Number: [id number] ?Patient Account Number: [id number] ?Date of Birth/Sex: 06-23-1966 (56 y.o. M) ?Treating RN: [name] ?Primary Care Juda Toepfer: [name] ?Other Clinician: Jacqulyn Bath ?Referring Swati Granberry: [name] ?Treating Vaida Kerchner/Extender: Jeri Cos ?Weeks in Treatment: 11 ?Visit Information History Since Last Visit ?Added or deleted any medications: No ?Patient Arrived: Ambulatory ?Any new allergies or adverse reactions: No ?Arrival Time: 10:06 ?Had a fall or experienced change in No ?Accompanied By: self ?activities of daily living that may affect ?Transfer Assistance: None ?risk of falls: ?Patient Identification Verified: Yes ?Signs or symptoms of abuse/neglect since last visito No ?Secondary Verification Process Completed: Yes ?Hospitalized since last visit: No ?Patient Requires Transmission-Based No ?Implantable device outside of the clinic excluding No ?Precautions: ?cellular tissue based products placed in the center ?Patient Has Alerts: Yes ?since last visit: ?Patient Alerts: Patient on Blood ?Has Dressing in Place as Prescribed: Yes ?Thinner ?Has Footwear/Offloading in Place as Prescribed: Yes ?DIALYSIS RIGHT ARM ?Left: Surgical Shoe with Pressure ?Aspirin 81/Brilinta ?Relief Insole ?Pain Present Now: No ?Electronic Signature(s) ?Signed: 01/17/2022 1:08:27 PM By: Enedina Finner RCP, RRT, CHT ?Entered By: Enedina Finner on 01/17/2022 10:57:45 ?Canipe, [place] ([id number]) ?-------------------------------------------------------------------------------- ?Encounter Discharge Information Details ?Patient Name: [name], [name] ?Date of Service: 01/17/2022 10:30 AM ?Medical Record Number: [id number] ?Patient Account Number: [id number] ?Date of Birth/Sex: [birth year] (56 y.o. M) ?Treating RN: [name] ?Primary  Care Anniya Whiters: [name] ?Other Clinician: Jacqulyn Bath ?Referring Zannie Runkle: [name] ?Treating Dionne Knoop/Extender: Jeri Cos ?Weeks in Treatment: 11 ?Encounter Discharge Information Items ?Discharge Condition: Stable ?Ambulatory Status: Ambulatory ?Discharge Destination: Home ?Transportation: Private Auto ?Accompanied By: self ?Schedule Follow-up Appointment: Yes ?Clinical Summary of Care: ?Notes ?Patient has an HBO treatment scheduled on 01/20/22 at 10:30 am. ?Electronic Signature(s) ?Signed: 01/17/2022 1:08:27 PM By: Enedina Finner RCP, RRT, CHT ?Entered By: Enedina Finner on 01/17/2022 12:45:03 ?Bartko, [place] ([id number]) ?-------------------------------------------------------------------------------- ?Vitals Details ?Patient Name: [name], [name] ?Date of Service: 01/17/2022 10:30 AM ?Medical Record Number: [id number] ?Patient Account Number: [id number] ?Date of Birth/Sex: 1966/03/21 (56 y.o. M) ?Treating RN: [name] ?Primary Care Zeidy Tayag: [name] ?Other Clinician: Jacqulyn Bath ?Referring Pati Thinnes: [name] ?Treating Jourdin Connors/Extender: Jeri Cos ?Weeks in Treatment: 11 ?Vital Signs ?Time Taken: 10:08 ?Temperature (??F): 98.0 ?Height (in): 70 ?Pulse (bpm): 78 ?Weight (lbs): 180.4 ?Respiratory Rate (breaths/min): 16 ?Body Mass Index (BMI): 25.9 ?Blood Pressure (mmHg): 128/74 ?Capillary Blood Glucose (mg/dl): 183 ?Reference Range: 80 - 120 mg / dl ?Electronic Signature(s) ?Signed: 01/17/2022 1:08:27 PM By: Enedina Finner RCP, RRT, CHT ?Entered By: Enedina Finner on 01/17/2022 10:59:28 ?

## 2022-01-17 NOTE — Progress Notes (Signed)
[place], [place] ([id number]) ?Visit Report for 01/16/2022 ?HBO Details ?Patient Name: [name], [name] ?Date of Service: 01/16/2022 8:00 AM ?Medical Record Number: [id number] ?Patient Account Number: [id number] ?Date of Birth/Sex: 02-23-1966 (56 y.o. M) ?Treating RN: [name] ?Primary Care Aerabella Galasso: [name] ?Other Clinician: Jacqulyn Bath ?Referring Heer Justiss: [name] ?Treating Artist Bloom/Extender: Jeri Cos ?Weeks in Treatment: 11 ?HBO Treatment Course Details ?Treatment Course Number: 1 ?Ordering Hajar Penninger: Jeri Cos ?Total Treatments Ordered: 60 ?HBO Treatment Start Date: 11/04/2021 ?HBO Indication: ?Chronic Refractory Osteomyelitis to Left Foot ?HBO Treatment Details ?Treatment Number: 48 ?Patient Type: Outpatient ?Chamber Type: Monoplace ?Chamber Serial #: [id number] ?Treatment Protocol: 2.0 ATA with 90 minutes oxygen, and no air breaks ?Treatment Details ?Compression Rate Down: 1.0 psi / minute ?De-Compression Rate Up: 1.0 psi / minute ?Compress Tx Pressure Air breaks and breathing periods Decompress Decompress ?Begins Reached (leave unused spaces blank) Begins Ends ?Chamber Pressure (ATA) 1 2 - - - - - - 2 1 ?Clock Time (24 hr) 08:26 08:43 - - - - - - 10:14 10:28 ?Treatment Length: 122 (minutes) ?Treatment Segments: 4 ?Vital Signs ?Capillary Blood Glucose Reference Range: 80 - 120 mg / dl ?HBO Diabetic Blood Glucose Intervention Range: <131 mg/dl or >249 mg/dl ?Time Vitals Blood Respiratory Capillary Blood Glucose Pulse Action ?Type: ?Pulse: Temperature: ?Taken: ?Pressure: ?Rate: ?Glucose (mg/dl): ?Meter #: Oximetry (%) Taken: ?Pre 08:02 132/76 72 16 98.1 167 1 none per protocol ?Post 10:32 132/74 72 16 98.1 181 1 none per protocol ?Treatment Response ?Treatment Toleration: Well ?Treatment Completion ?Treatment Completed without Adverse Event ?Status: ?Electronic Signature(s) ?Signed: 01/16/2022 4:23:05 PM By: Worthy Keeler PA-C ?Signed: 01/17/2022 1:08:27 PM By: Enedina Finner RCP, RRT,  CHT ?Entered By: Enedina Finner on 01/16/2022 11:43:10 ?[place], [place] ([id number]) ?-------------------------------------------------------------------------------- ?HBO Safety Checklist Details ?Patient Name: [name], [name] ?Date of Service: 01/16/2022 8:00 AM ?Medical Record Number: [id number] ?Patient Account Number: [id number] ?Date of Birth/Sex: March 31, 1966 (56 y.o. M) ?Treating RN: [name] ?Primary Care Adelei Scobey: [name] ?Other Clinician: Jacqulyn Bath ?Referring Sigrid Schwebach: [name] ?Treating Chozen Latulippe/Extender: Jeri Cos ?Weeks in Treatment: 11 ?HBO Safety Checklist Items ?Safety Checklist ?Consent Form Signed ?Patient voided / foley secured and emptied ?When did you last eato 07:00 an ?Last dose of injectable or oral agent n/a ?Ostomy pouch emptied and vented if applicable ?NA ?All implantable devices assessed, documented and approved ?NA ?Intravenous access site secured and place ?NA ?Valuables secured ?Linens and cotton and cotton/polyester blend (less than 51% polyester) ?Personal oil-based products / skin lotions / body lotions removed ?Wigs or hairpieces removed ?NA ?Smoking or tobacco materials removed ?NA ?Books / newspapers / magazines / loose paper removed ?Cologne, aftershave, perfume and deodorant removed ?Jewelry removed (may wrap wedding band) ?Make-up removed ?NA ?Hair care products removed ?Battery operated devices (external) removed ?NA ?Heating patches and chemical warmers removed ?NA ?Titanium eyewear removed ?NA ?Nail polish cured greater than 10 hours ?NA ?Casting material cured greater than 10 hours ?NA ?Hearing aids removed ?NA ?Loose dentures or partials removed ?NA ?Prosthetics have been removed ?NA ?Patient demonstrates correct use of air break device (if applicable) ?Patient concerns have been addressed ?Patient grounding bracelet on and cord attached to chamber ?Specifics for Inpatients (complete in addition to ?above) ?Medication sheet sent with  patient ?Intravenous medications needed or due during therapy sent with patient ?Drainage tubes (e.g. nasogastric tube or chest tube secured and vented) ?Endotracheal or Tracheotomy tube secured ?Cuff deflated of air and inflated with saline ?Airway suctioned ?Electronic Signature(s) ?Signed: 01/17/2022 1:08:27  PM By: Enedina Finner RCP, RRT, CHT ?Entered By: Enedina Finner on 01/16/2022 08:58:13 ?

## 2022-01-17 NOTE — Progress Notes (Signed)
[place], [place] ([id number]) ?Visit Report for 01/17/2022 ?HBO Details ?Patient Name: [name], [name] ?Date of Service: 01/17/2022 10:30 AM ?Medical Record Number: [id number] ?Patient Account Number: [id number] ?Date of Birth/Sex: Jun 20, 1966 (56 y.o. M) ?Treating RN: [name] ?Primary Care Anaelle Dunton: [name] ?Other Clinician: Jacqulyn Bath ?Referring Sharlize Hoar: [name] ?Treating Shirell Struthers/Extender: Jeri Cos ?Weeks in Treatment: 11 ?HBO Treatment Course Details ?Treatment Course Number: 1 ?Ordering Kalysta Kneisley: Jeri Cos ?Total Treatments Ordered: 60 ?HBO Treatment Start Date: 11/04/2021 ?HBO Indication: ?Chronic Refractory Osteomyelitis to Left Foot ?HBO Treatment Details ?Treatment Number: 49 ?Patient Type: Outpatient ?Chamber Type: Monoplace ?Chamber Serial #: [id number] ?Treatment Protocol: 2.0 ATA with 90 minutes oxygen, and no air breaks ?Treatment Details ?Compression Rate Down: 1.0 psi / minute ?De-Compression Rate Up: 1.5 psi / minute ?Compress Tx Pressure Air breaks and breathing periods Decompress Decompress ?Begins Reached (leave unused spaces blank) Begins Ends ?Chamber Pressure (ATA) 1 2 - - - - - - 2 1 ?Clock Time (24 hr) 10:22 10:39 - - - - - - 12:09 12:19 ?Treatment Length: 117 (minutes) ?Treatment Segments: 4 ?Vital Signs ?Capillary Blood Glucose Reference Range: 80 - 120 mg / dl ?HBO Diabetic Blood Glucose Intervention Range: <131 mg/dl or >249 mg/dl ?Time Vitals Blood Respiratory Capillary Blood Glucose Pulse Action ?Type: ?Pulse: Temperature: ?Taken: ?Pressure: ?Rate: ?Glucose (mg/dl): ?Meter #: Oximetry (%) Taken: ?Pre 10:08 128/74 78 16 98 183 1 none per protocol ?Post 12:24 122/74 78 16 98.6 146 1 none per protocol ?Treatment Response ?Treatment Toleration: Well ?Treatment Completion ?Treatment Completed without Adverse Event ?Status: ?Electronic Signature(s) ?Signed: 01/17/2022 1:08:27 PM By: Enedina Finner RCP, RRT, CHT ?Signed: 01/17/2022 4:34:33 PM By: Worthy Keeler  PA-C ?Entered By: Enedina Finner on 01/17/2022 12:44:04 ?[place], [place] ([id number]) ?-------------------------------------------------------------------------------- ?HBO Safety Checklist Details ?Patient Name: [name], [name] ?Date of Service: 01/17/2022 10:30 AM ?Medical Record Number: [id number] ?Patient Account Number: [id number] ?Date of Birth/Sex: 06-16-1966 (56 y.o. M) ?Treating RN: [name] ?Primary Care Tyrae Alcoser: [name] ?Other Clinician: Jacqulyn Bath ?Referring Malekai Markwood: [name] ?Treating Chellsie Gomer/Extender: Jeri Cos ?Weeks in Treatment: 11 ?HBO Safety Checklist Items ?Safety Checklist ?Consent Form Signed ?Patient voided / foley secured and emptied ?When did you last eato 09:30 am ?Last dose of injectable or oral agent n/a ?Ostomy pouch emptied and vented if applicable ?NA ?All implantable devices assessed, documented and approved ?NA ?Intravenous access site secured and place ?NA ?Valuables secured ?Linens and cotton and cotton/polyester blend (less than 51% polyester) ?Personal oil-based products / skin lotions / body lotions removed ?Wigs or hairpieces removed ?NA ?Smoking or tobacco materials removed ?NA ?Books / newspapers / magazines / loose paper removed ?NA ?Cologne, aftershave, perfume and deodorant removed ?Jewelry removed (may wrap wedding band) ?Make-up removed ?NA ?Hair care products removed ?Battery operated devices (external) removed ?NA ?Heating patches and chemical warmers removed ?NA ?Titanium eyewear removed ?NA ?Nail polish cured greater than 10 hours ?NA ?Casting material cured greater than 10 hours ?NA ?Hearing aids removed ?NA ?Loose dentures or partials removed ?NA ?Prosthetics have been removed ?NA ?Patient demonstrates correct use of air break device (if applicable) ?Patient concerns have been addressed ?Patient grounding bracelet on and cord attached to chamber ?Specifics for Inpatients (complete in addition to ?above) ?Medication sheet sent with  patient ?Intravenous medications needed or due during therapy sent with patient ?Drainage tubes (e.g. nasogastric tube or chest tube secured and vented) ?Endotracheal or Tracheotomy tube secured ?Cuff deflated of air and inflated with saline ?Airway suctioned ?Electronic Signature(s) ?Signed: 01/17/2022  1:08:27 PM By: Enedina Finner RCP, RRT, CHT ?Entered By: Enedina Finner on 01/17/2022 11:01:20 ?

## 2022-01-18 DIAGNOSIS — E1129 Type 2 diabetes mellitus with other diabetic kidney complication: Secondary | ICD-10-CM | POA: Diagnosis not present

## 2022-01-18 DIAGNOSIS — N186 End stage renal disease: Secondary | ICD-10-CM | POA: Diagnosis not present

## 2022-01-18 DIAGNOSIS — Z992 Dependence on renal dialysis: Secondary | ICD-10-CM | POA: Diagnosis not present

## 2022-01-20 ENCOUNTER — Encounter: Payer: Medicare Other | Attending: Physician Assistant | Admitting: Physician Assistant

## 2022-01-20 DIAGNOSIS — N186 End stage renal disease: Secondary | ICD-10-CM | POA: Diagnosis not present

## 2022-01-20 DIAGNOSIS — R2689 Other abnormalities of gait and mobility: Secondary | ICD-10-CM | POA: Insufficient documentation

## 2022-01-20 DIAGNOSIS — I5042 Chronic combined systolic (congestive) and diastolic (congestive) heart failure: Secondary | ICD-10-CM | POA: Diagnosis not present

## 2022-01-20 DIAGNOSIS — L02612 Cutaneous abscess of left foot: Secondary | ICD-10-CM | POA: Diagnosis not present

## 2022-01-20 DIAGNOSIS — Z5181 Encounter for therapeutic drug level monitoring: Secondary | ICD-10-CM | POA: Diagnosis not present

## 2022-01-20 DIAGNOSIS — E11621 Type 2 diabetes mellitus with foot ulcer: Secondary | ICD-10-CM | POA: Diagnosis not present

## 2022-01-20 DIAGNOSIS — L97524 Non-pressure chronic ulcer of other part of left foot with necrosis of bone: Secondary | ICD-10-CM | POA: Diagnosis not present

## 2022-01-20 DIAGNOSIS — I2510 Atherosclerotic heart disease of native coronary artery without angina pectoris: Secondary | ICD-10-CM | POA: Diagnosis not present

## 2022-01-20 DIAGNOSIS — M86672 Other chronic osteomyelitis, left ankle and foot: Secondary | ICD-10-CM | POA: Diagnosis not present

## 2022-01-20 DIAGNOSIS — N2581 Secondary hyperparathyroidism of renal origin: Secondary | ICD-10-CM | POA: Diagnosis not present

## 2022-01-20 DIAGNOSIS — Z992 Dependence on renal dialysis: Secondary | ICD-10-CM | POA: Diagnosis not present

## 2022-01-20 DIAGNOSIS — I10 Essential (primary) hypertension: Secondary | ICD-10-CM | POA: Diagnosis not present

## 2022-01-20 DIAGNOSIS — I96 Gangrene, not elsewhere classified: Secondary | ICD-10-CM | POA: Diagnosis not present

## 2022-01-20 DIAGNOSIS — D631 Anemia in chronic kidney disease: Secondary | ICD-10-CM | POA: Diagnosis not present

## 2022-01-20 LAB — GLUCOSE, CAPILLARY
Glucose-Capillary: 202 mg/dL — ABNORMAL HIGH (ref 70–99)
Glucose-Capillary: 143 mg/dL — ABNORMAL HIGH (ref 70–99)

## 2022-01-20 LAB — AEROBIC CULTURE W GRAM STAIN (SUPERFICIAL SPECIMEN)

## 2022-01-20 NOTE — Progress Notes (Signed)
[place], [place] ([id number]) ?Visit Report for 01/20/2022 ?Arrival Information Details ?Patient Name: [name], [name] ?Date of Service: 01/20/2022 10:30 AM ?Medical Record Number: [id number] ?Patient Account Number: [id number] ?Date of Birth/Sex: 1966-02-04 (56 y.o. M) ?Treating RN: [name] ?Primary Care Adilenne Ashworth: [name] ?Other Clinician: Jacqulyn Bath ?Referring Tida Saner: [name] ?Treating Lanique Gonzalo/Extender: Jeri Cos ?Weeks in Treatment: 11 ?Visit Information History Since Last Visit ?Added or deleted any medications: No ?Patient Arrived: Ambulatory ?Any new allergies or adverse reactions: No ?Arrival Time: 10:20 ?Had a fall or experienced change in No ?Accompanied By: self ?activities of daily living that may affect ?Transfer Assistance: None ?risk of falls: ?Patient Identification Verified: Yes ?Signs or symptoms of abuse/neglect since last visito No ?Secondary Verification Process Completed: Yes ?Hospitalized since last visit: No ?Patient Requires Transmission-Based No ?Implantable device outside of the clinic excluding No ?Precautions: ?cellular tissue based products placed in the center ?Patient Has Alerts: Yes ?since last visit: ?Patient Alerts: Patient on Blood ?Has Dressing in Place as Prescribed: Yes ?Thinner ?Has Footwear/Offloading in Place as Prescribed: Yes ?DIALYSIS RIGHT ARM ?Left: Surgical Shoe with Pressure ?Aspirin 81/Brilinta ?Relief Insole ?Pain Present Now: No ?Electronic Signature(s) ?Signed: 01/20/2022 1:44:31 PM By: Enedina Finner RCP, RRT, CHT ?Entered By: Enedina Finner on 01/20/2022 10:51:30 ?[place], [place] ([id number]) ?-------------------------------------------------------------------------------- ?Encounter Discharge Information Details ?Patient Name: [name], [name] ?Date of Service: 01/20/2022 10:30 AM ?Medical Record Number: [id number] ?Patient Account Number: [id number] ?Date of Birth/Sex: 05/22/1966 (56 y.o. M) ?Treating RN: [name] ?Primary Care  Toby Ayad: [name] ?Other Clinician: Jacqulyn Bath ?Referring Marykathleen Russi: [name] ?Treating Jonathon Castelo/Extender: Jeri Cos ?Weeks in Treatment: 11 ?Encounter Discharge Information Items ?Discharge Condition: Stable ?Ambulatory Status: Ambulatory ?Discharge Destination: Home ?Transportation: Private Auto ?Accompanied By: self ?Schedule Follow-up Appointment: Yes ?Clinical Summary of Care: ?Notes ?Patient has an HBO treatment scheduled on 01/21/22 at 08:00 am. ?Electronic Signature(s) ?Signed: 01/20/2022 1:44:31 PM By: Enedina Finner RCP, RRT, CHT ?Entered By: Enedina Finner on 01/20/2022 13:43:39 ?[place], [place] ([id number]) ?-------------------------------------------------------------------------------- ?Vitals Details ?Patient Name: Badertscher, [name] ?Date of Service: 01/20/2022 10:30 AM ?Medical Record Number: [id number] ?Patient Account Number: [id number] ?Date of Birth/Sex: Nov 22, 1965 (56 y.o. M) ?Treating RN: [name] ?Primary Care Shealeigh Dunstan: [name] ?Other Clinician: Jacqulyn Bath ?Referring Nikiyah Fackler: [name] ?Treating Vance Belcourt/Extender: Jeri Cos ?Weeks in Treatment: 11 ?Vital Signs ?Time Taken: 10:24 ?Temperature (??F): 98.1 ?Height (in): 70 ?Pulse (bpm): 72 ?Weight (lbs): 180.4 ?Respiratory Rate (breaths/min): 16 ?Body Mass Index (BMI): 25.9 ?Blood Pressure (mmHg): 128/72 ?Capillary Blood Glucose (mg/dl): 202 ?Reference Range: 80 - 120 mg / dl ?Electronic Signature(s) ?Signed: 01/20/2022 1:44:31 PM By: Enedina Finner RCP, RRT, CHT ?Entered By: Enedina Finner on 01/20/2022 10:52:15 ?

## 2022-01-20 NOTE — Progress Notes (Signed)
[place], [place] ([id number]) ?Visit Report for 01/20/2022 ?HBO Details ?Patient Name: [name], [name] ?Date of Service: 01/20/2022 10:30 AM ?Medical Record Number: [id number] ?Patient Account Number: [id number] ?Date of Birth/Sex: 14-Oct-1966 (56 y.o. M) ?Treating RN: [name] ?Primary Care Italo Banton: [name] ?Other Clinician: Jacqulyn Bath ?Referring Elyjah Hazan: [name] ?Treating Tevis Conger/Extender: Jeri Cos ?Weeks in Treatment: 11 ?HBO Treatment Course Details ?Treatment Course Number: 1 ?Ordering Algenis Ballin: Jeri Cos ?Total Treatments Ordered: 60 ?HBO Treatment Start Date: 11/04/2021 ?HBO Indication: ?Chronic Refractory Osteomyelitis to Left Foot ?HBO Treatment Details ?Treatment Number: 50 ?Patient Type: Outpatient ?Chamber Type: Monoplace ?Chamber Serial #: [id number] ?Treatment Protocol: 2.0 ATA with 90 minutes oxygen, and no air breaks ?Treatment Details ?Compression Rate Down: 1.0 psi / minute ?De-Compression Rate Up: 1.5 psi / minute ?Compress Tx Pressure Air breaks and breathing periods Decompress Decompress ?Begins Reached (leave unused spaces blank) Begins Ends ?Chamber Pressure (ATA) 1 2 - - - - - - 2 1 ?Clock Time (24 hr) 10:42 10:58 - - - - - - 12:28 12:38 ?Treatment Length: 116 (minutes) ?Treatment Segments: 4 ?Vital Signs ?Capillary Blood Glucose Reference Range: 80 - 120 mg / dl ?HBO Diabetic Blood Glucose Intervention Range: <131 mg/dl or >249 mg/dl ?Time Vitals Blood Respiratory Capillary Blood Glucose Pulse Action ?Type: ?Pulse: Temperature: ?Taken: ?Pressure: ?Rate: ?Glucose (mg/dl): ?Meter #: Oximetry (%) Taken: ?Pre 10:24 128/72 72 16 98.1 202 1 none per protocol ?Post 12:44 132/78 66 16 98.5 143 1 none per protocol ?Treatment Response ?Treatment Toleration: Well ?Treatment Completion ?Treatment Completed without Adverse Event ?Status: ?Electronic Signature(s) ?Signed: 01/20/2022 1:44:31 PM By: Enedina Finner RCP, RRT, CHT ?Signed: 01/20/2022 4:14:46 PM By: Worthy Keeler  PA-C ?Entered By: Enedina Finner on 01/20/2022 13:42:31 ?[name], [name] ([id number]) ?-------------------------------------------------------------------------------- ?HBO Safety Checklist Details ?Patient Name: [name], [name] ?Date of Service: 01/20/2022 10:30 AM ?Medical Record Number: [id number] ?Patient Account Number: [id number] ?Date of Birth/Sex: 1966/02/13 (56 y.o. M) ?Treating RN: [name] ?Primary Care Dewel Lotter: [name] ?Other Clinician: Jacqulyn Bath ?Referring Jahaan Vanwagner: [name] ?Treating Aodhan Scheidt/Extender: Jeri Cos ?Weeks in Treatment: 11 ?HBO Safety Checklist Items ?Safety Checklist ?Consent Form Signed ?Patient voided / foley secured and emptied ?When did you last eato 09:30 am ?Last dose of injectable or oral agent n/a ?Ostomy pouch emptied and vented if applicable ?NA ?All implantable devices assessed, documented and approved ?NA ?Intravenous access site secured and place ?NA ?Valuables secured ?Linens and cotton and cotton/polyester blend (less than 51% polyester) ?Personal oil-based products / skin lotions / body lotions removed ?Wigs or hairpieces removed ?NA ?Smoking or tobacco materials removed ?NA ?Books / newspapers / magazines / loose paper removed ?NA ?Cologne, aftershave, perfume and deodorant removed ?Jewelry removed (may wrap wedding band) ?Make-up removed ?NA ?Hair care products removed ?Battery operated devices (external) removed ?NA ?Heating patches and chemical warmers removed ?NA ?Titanium eyewear removed ?NA ?Nail polish cured greater than 10 hours ?NA ?Casting material cured greater than 10 hours ?NA ?Hearing aids removed ?NA ?Loose dentures or partials removed ?NA ?Prosthetics have been removed ?NA ?Patient demonstrates correct use of air break device (if applicable) ?Patient concerns have been addressed ?Patient grounding bracelet on and cord attached to chamber ?Specifics for Inpatients (complete in addition to ?above) ?Medication sheet sent with  patient ?Intravenous medications needed or due during therapy sent with patient ?Drainage tubes (e.g. nasogastric tube or chest tube secured and vented) ?Endotracheal or Tracheotomy tube secured ?Cuff deflated of air and inflated with saline ?Airway suctioned ?Electronic Signature(s) ?Signed: 01/20/2022  1:44:31 PM By: Enedina Finner RCP, RRT, CHT ?Entered By: Enedina Finner on 01/20/2022 10:53:30 ?

## 2022-01-21 ENCOUNTER — Encounter: Payer: Medicare Other | Admitting: Physician Assistant

## 2022-01-21 DIAGNOSIS — D631 Anemia in chronic kidney disease: Secondary | ICD-10-CM | POA: Diagnosis not present

## 2022-01-21 DIAGNOSIS — N186 End stage renal disease: Secondary | ICD-10-CM | POA: Diagnosis not present

## 2022-01-21 DIAGNOSIS — E1151 Type 2 diabetes mellitus with diabetic peripheral angiopathy without gangrene: Secondary | ICD-10-CM | POA: Diagnosis not present

## 2022-01-21 DIAGNOSIS — Z4781 Encounter for orthopedic aftercare following surgical amputation: Secondary | ICD-10-CM | POA: Diagnosis not present

## 2022-01-21 DIAGNOSIS — E1122 Type 2 diabetes mellitus with diabetic chronic kidney disease: Secondary | ICD-10-CM | POA: Diagnosis not present

## 2022-01-21 DIAGNOSIS — I120 Hypertensive chronic kidney disease with stage 5 chronic kidney disease or end stage renal disease: Secondary | ICD-10-CM | POA: Diagnosis not present

## 2022-01-22 ENCOUNTER — Encounter (HOSPITAL_BASED_OUTPATIENT_CLINIC_OR_DEPARTMENT_OTHER): Payer: Medicare Other | Admitting: Internal Medicine

## 2022-01-22 DIAGNOSIS — N2581 Secondary hyperparathyroidism of renal origin: Secondary | ICD-10-CM | POA: Diagnosis not present

## 2022-01-22 DIAGNOSIS — L02612 Cutaneous abscess of left foot: Secondary | ICD-10-CM | POA: Diagnosis not present

## 2022-01-22 DIAGNOSIS — I2510 Atherosclerotic heart disease of native coronary artery without angina pectoris: Secondary | ICD-10-CM | POA: Diagnosis not present

## 2022-01-22 DIAGNOSIS — L97524 Non-pressure chronic ulcer of other part of left foot with necrosis of bone: Secondary | ICD-10-CM | POA: Diagnosis not present

## 2022-01-22 DIAGNOSIS — I5042 Chronic combined systolic (congestive) and diastolic (congestive) heart failure: Secondary | ICD-10-CM | POA: Diagnosis not present

## 2022-01-22 DIAGNOSIS — D631 Anemia in chronic kidney disease: Secondary | ICD-10-CM | POA: Diagnosis not present

## 2022-01-22 DIAGNOSIS — M86672 Other chronic osteomyelitis, left ankle and foot: Secondary | ICD-10-CM

## 2022-01-22 DIAGNOSIS — I10 Essential (primary) hypertension: Secondary | ICD-10-CM | POA: Diagnosis not present

## 2022-01-22 DIAGNOSIS — I96 Gangrene, not elsewhere classified: Secondary | ICD-10-CM | POA: Diagnosis not present

## 2022-01-22 DIAGNOSIS — N186 End stage renal disease: Secondary | ICD-10-CM | POA: Diagnosis not present

## 2022-01-22 DIAGNOSIS — E11621 Type 2 diabetes mellitus with foot ulcer: Secondary | ICD-10-CM

## 2022-01-22 DIAGNOSIS — Z992 Dependence on renal dialysis: Secondary | ICD-10-CM | POA: Diagnosis not present

## 2022-01-22 LAB — GLUCOSE, CAPILLARY
Glucose-Capillary: 236 mg/dL — ABNORMAL HIGH (ref 70–99)
Glucose-Capillary: 162 mg/dL — ABNORMAL HIGH (ref 70–99)

## 2022-01-22 NOTE — Progress Notes (Signed)
[place], [place] ([id number]) ?Visit Report for 01/22/2022 ?HBO Details ?Patient Name: [name], [name] ?Date of Service: 01/22/2022 10:30 AM ?Medical Record Number: [id number] ?Patient Account Number: [id number] ?Date of Birth/Sex: Apr 26, 1966 (56 y.o. M) ?Treating RN: [name] ?Primary Care Kiyoshi Schaab: [name] ?Other Clinician: Jacqulyn Bath ?Referring Damian Buckles: [name] ?Treating Raquan Iannone/Extender: Kalman Shan ?Weeks in Treatment: 11 ?HBO Treatment Course Details ?Treatment Course Number: 1 ?Ordering Kynnadi Dicenso: Jeri Cos ?Total Treatments Ordered: 60 ?HBO Treatment Start Date: 11/04/2021 ?HBO Indication: ?Chronic Refractory Osteomyelitis to Left Foot ?HBO Treatment Details ?Treatment Number: 96 ?Patient Type: Outpatient ?Chamber Type: Monoplace ?Chamber Serial #: [id number] ?Treatment Protocol: 2.0 ATA with 90 minutes oxygen, and no air breaks ?Treatment Details ?Compression Rate Down: 1.0 psi / minute ?De-Compression Rate Up: 1.5 psi / minute ?Compress Tx Pressure Air breaks and breathing periods Decompress Decompress ?Begins Reached (leave unused spaces blank) Begins Ends ?Chamber Pressure (ATA) 1 2 - - - - - - 2 1 ?Clock Time (24 hr) 10:12 10:30 - - - - - - 12:00 12:10 ?Treatment Length: 118 (minutes) ?Treatment Segments: 4 ?Vital Signs ?Capillary Blood Glucose Reference Range: 80 - 120 mg / dl ?HBO Diabetic Blood Glucose Intervention Range: <131 mg/dl or >249 mg/dl ?Time Vitals Blood Respiratory Capillary Blood Glucose Pulse Action ?Type: ?Pulse: Temperature: ?Taken: ?Pressure: ?Rate: ?Glucose (mg/dl): ?Meter #: Oximetry (%) Taken: ?Pre 10:00 138/72 84 16 98.2 236 1 none per protocol ?Post 12:14 128/74 72 16 98.5 162 1 none per protocol ?Treatment Response ?Treatment Toleration: Well ?Treatment Completion ?Treatment Completed without Adverse Event ?Status: ?HBO Attestation ?I certify that I supervised this HBO treatment in accordance with ?Medicare guidelines. A trained emergency response team is  readily Yes ?available per hospital policies and procedures. ?Continue HBOT as ordered. Yes ?Electronic Signature(s) ?Signed: 01/22/2022 3:34:33 PM By: Kalman Shan DO ?Previous Signature: 01/22/2022 1:08:32 PM Version By: Enedina Finner RCP, RRT, CHT ?Entered By: Kalman Shan on 01/22/2022 15:16:13 ?[place], [place] ([id number]) ?-------------------------------------------------------------------------------- ?HBO Safety Checklist Details ?Patient Name: [name], [name] ?Date of Service: 01/22/2022 10:30 AM ?Medical Record Number: [id number] ?Patient Account Number: [id number] ?Date of Birth/Sex: 10-23-1965 (56 y.o. M) ?Treating RN: [name] ?Primary Care Audry Kauzlarich: [name] ?Other Clinician: Jacqulyn Bath ?Referring Dequarius Jeffries: [name] ?Treating Alejah Aristizabal/Extender: Kalman Shan ?Weeks in Treatment: 11 ?HBO Safety Checklist Items ?Safety Checklist ?Consent Form Signed ?Patient voided / foley secured and emptied ?When did you last eato 09:30 am ?Last dose of injectable or oral agent n/a ?Ostomy pouch emptied and vented if applicable ?NA ?All implantable devices assessed, documented and approved ?NA ?Intravenous access site secured and place ?NA ?Valuables secured ?Linens and cotton and cotton/polyester blend (less than 51% polyester) ?Personal oil-based products / skin lotions / body lotions removed ?Wigs or hairpieces removed ?NA ?Smoking or tobacco materials removed ?NA ?Books / newspapers / magazines / loose paper removed ?NA ?Cologne, aftershave, perfume and deodorant removed ?Jewelry removed (may wrap wedding band) ?Make-up removed ?NA ?Hair care products removed ?Battery operated devices (external) removed ?NA ?Heating patches and chemical warmers removed ?NA ?Titanium eyewear removed ?NA ?Nail polish cured greater than 10 hours ?NA ?Casting material cured greater than 10 hours ?NA ?Hearing aids removed ?NA ?Loose dentures or partials removed ?NA ?Prosthetics have been  removed ?NA ?Patient demonstrates correct use of air break device (if applicable) ?Patient concerns have been addressed ?Patient grounding bracelet on and cord attached to chamber ?Specifics for Inpatients (complete in addition to ?above) ?Medication sheet sent with patient ?Intravenous medications needed or due during  therapy sent with patient ?Drainage tubes (e.g. nasogastric tube or chest tube secured and vented) ?Endotracheal or Tracheotomy tube secured ?Cuff deflated of air and inflated with saline ?Airway suctioned ?Electronic Signature(s) ?Signed: 01/22/2022 1:08:32 PM By: Enedina Finner RCP, RRT, CHT ?Entered By: Enedina Finner on 01/22/2022 10:42:51 ?

## 2022-01-22 NOTE — Progress Notes (Signed)
Demarinis, [place]enn[place] ([id number]) ?Visit Report for 01/22/2022 ?Arrival Information Details ?Patient Name: [name], [name] ?Date of Service: 01/22/2022 10:30 AM ?Medical Record Number: [id number] ?Patient Account Number: [id number] ?Date of Birth/Sex: 02-Jan-1966 (56 y.o. M) ?Treating RN: [name] ?Primary Care Rhylin Venters: [name] ?Other Clinician: Jacqulyn Bath ?Referring Valina Maes: [name] ?Treating Myrtice Lowdermilk/Extender: Kalman Shan ?Weeks in Treatment: 11 ?Visit Information History Since Last Visit ?Added or deleted any medications: No ?Patient Arrived: Ambulatory ?Any new allergies or adverse reactions: No ?Arrival Time: 10:00 ?Had a fall or experienced change in No ?Accompanied By: self ?activities of daily living that may affect ?Transfer Assistance: None ?risk of falls: ?Patient Identification Verified: Yes ?Signs or symptoms of abuse/neglect since last visito No ?Secondary Verification Process Completed: Yes ?Hospitalized since last visit: No ?Patient Requires Transmission-Based No ?Implantable device outside of the clinic excluding No ?Precautions: ?cellular tissue based products placed in the center ?Patient Has Alerts: Yes ?since last visit: ?Patient Alerts: Patient on Blood ?Has Dressing in Place as Prescribed: Yes ?Thinner ?Has Footwear/Offloading in Place as Prescribed: Yes ?DIALYSIS RIGHT ARM ?Left: Surgical Shoe with Pressure ?Aspirin 81/Brilinta ?Relief Insole ?Pain Present Now: No ?Electronic Signature(s) ?Signed: 01/22/2022 1:08:32 PM By: Enedina Finner RCP, RRT, CHT ?Entered By: Enedina Finner on 01/22/2022 10:13:52 ?Novitsky, [place] ([id number]) ?-------------------------------------------------------------------------------- ?Encounter Discharge Information Details ?Patient Name: [name], [name] ?Date of Service: 01/22/2022 10:30 AM ?Medical Record Number: [id number] ?Patient Account Number: [id number] ?Date of Birth/Sex: [birth year] (56 y.o. M) ?Treating RN: [name] ?Primary  Care Muriah Harsha: [name] ?Other Clinician: Jacqulyn Bath ?Referring Acire Tang: [name] ?Treating Penelope Fittro/Extender: Kalman Shan ?Weeks in Treatment: 11 ?Encounter Discharge Information Items ?Discharge Condition: Stable ?Ambulatory Status: Ambulatory ?Discharge Destination: Home ?Transportation: Private Auto ?Accompanied By: self ?Schedule Follow-up Appointment: Yes ?Clinical Summary of Care: ?Notes ?Patient has an HBO treatment scheduled on 01/23/22 at 08:00 am. ?Electronic Signature(s) ?Signed: 01/22/2022 1:08:32 PM By: Enedina Finner RCP, RRT, CHT ?Entered By: Enedina Finner on 01/22/2022 12:39:30 ?Chura, [place] ([id number]) ?-------------------------------------------------------------------------------- ?Vitals Details ?Patient Name: [name], [name] ?Date of Service: 01/22/2022 10:30 AM ?Medical Record Number: [id number] ?Patient Account Number: [id number] ?Date of Birth/Sex: 19-Apr-1966 (56 y.o. M) ?Treating RN: [name] ?Primary Care Jabree Pernice: [name] ?Other Clinician: Jacqulyn Bath ?Referring Althia Egolf: [name] ?Treating Saanya Zieske/Extender: Kalman Shan ?Weeks in Treatment: 11 ?Vital Signs ?Time Taken: 10:00 ?Temperature (??F): 98.2 ?Height (in): 70 ?Pulse (bpm): 84 ?Weight (lbs): 180.4 ?Respiratory Rate (breaths/min): 16 ?Body Mass Index (BMI): 25.9 ?Blood Pressure (mmHg): 138/72 ?Capillary Blood Glucose (mg/dl): 236 ?Reference Range: 80 - 120 mg / dl ?Electronic Signature(s) ?Signed: 01/22/2022 1:08:32 PM By: Enedina Finner RCP, RRT, CHT ?Entered By: Enedina Finner on 01/22/2022 10:41:28 ?

## 2022-01-23 ENCOUNTER — Encounter: Payer: Medicare Other | Admitting: Physician Assistant

## 2022-01-23 DIAGNOSIS — Z4889 Encounter for other specified surgical aftercare: Secondary | ICD-10-CM | POA: Diagnosis not present

## 2022-01-23 DIAGNOSIS — E11621 Type 2 diabetes mellitus with foot ulcer: Secondary | ICD-10-CM | POA: Diagnosis not present

## 2022-01-23 DIAGNOSIS — I5042 Chronic combined systolic (congestive) and diastolic (congestive) heart failure: Secondary | ICD-10-CM | POA: Diagnosis not present

## 2022-01-23 DIAGNOSIS — I2510 Atherosclerotic heart disease of native coronary artery without angina pectoris: Secondary | ICD-10-CM | POA: Diagnosis not present

## 2022-01-23 DIAGNOSIS — M86672 Other chronic osteomyelitis, left ankle and foot: Secondary | ICD-10-CM | POA: Diagnosis not present

## 2022-01-23 DIAGNOSIS — L97524 Non-pressure chronic ulcer of other part of left foot with necrosis of bone: Secondary | ICD-10-CM | POA: Diagnosis not present

## 2022-01-23 DIAGNOSIS — I10 Essential (primary) hypertension: Secondary | ICD-10-CM | POA: Diagnosis not present

## 2022-01-23 DIAGNOSIS — I739 Peripheral vascular disease, unspecified: Secondary | ICD-10-CM | POA: Diagnosis not present

## 2022-01-23 DIAGNOSIS — Z89412 Acquired absence of left great toe: Secondary | ICD-10-CM | POA: Diagnosis not present

## 2022-01-23 LAB — GLUCOSE, CAPILLARY
Glucose-Capillary: 124 mg/dL — ABNORMAL HIGH (ref 70–99)
Glucose-Capillary: 178 mg/dL — ABNORMAL HIGH (ref 70–99)
Glucose-Capillary: 121 mg/dL — ABNORMAL HIGH (ref 70–99)

## 2022-01-23 NOTE — Progress Notes (Signed)
[name] ([id number]) ?Visit Report for 01/23/2022 ?Arrival Information Details ?Patient Name: [name], [name] ?Date of Service: 01/23/2022 8:00 AM ?Medical Record Number: [id number] ?Patient Account Number: [id number] ?Date of Birth/Sex: [birth year] (56 y.o. M) ?Treating RN: [name] ?Primary Care Idamae Coccia: [name] ?Other Clinician: Jacqulyn Bath ?Referring Barbi Kumagai: [name] ?Treating Brianna Bennett/Extender: Jeri Cos ?Weeks in Treatment: 12 ?Visit Information History Since Last Visit ?Added or deleted any medications: No ?Patient Arrived: Ambulatory ?Any new allergies or adverse reactions: No ?Arrival Time: 07:55 ?Had a fall or experienced change in No ?Accompanied By: self ?activities of daily living that may affect ?Transfer Assistance: None ?risk of falls: ?Patient Identification Verified: Yes ?Signs or symptoms of abuse/neglect since last visito No ?Secondary Verification Process Completed: Yes ?Hospitalized since last visit: No ?Patient Requires Transmission-Based No ?Implantable device outside of the clinic excluding No ?Precautions: ?cellular tissue based products placed in the center ?Patient Has Alerts: Yes ?since last visit: ?Patient Alerts: Patient on Blood ?Has Dressing in Place as Prescribed: Yes ?Thinner ?Has Footwear/Offloading in Place as Prescribed: Yes ?DIALYSIS RIGHT ARM ?Left: Surgical Shoe with Pressure ?Aspirin 81/Brilinta ?Relief Insole ?Pain Present Now: No ?Electronic Signature(s) ?Signed: 01/23/2022 1:10:07 PM By: Enedina Finner RCP, RRT, CHT ?Entered By: Enedina Finner on 01/23/2022 08:39:18 ?[name] ([id number]) ?-------------------------------------------------------------------------------- ?Encounter Discharge Information Details ?Patient Name: [name], [name] ?Date of Service: 01/23/2022 8:00 AM ?Medical Record Number: [id number] ?Patient Account Number: [id number] ?Date of Birth/Sex: 06-07-1966 (56 y.o. M) ?Treating RN: [name] ?Primary Care  Alexandros Ewan: [name] ?Other Clinician: Jacqulyn Bath ?Referring Hedda Crumbley: [name] ?Treating Monice Lundy/Extender: Jeri Cos ?Weeks in Treatment: 12 ?Encounter Discharge Information Items ?Discharge Condition: Stable ?Ambulatory Status: Ambulatory ?Discharge Destination: Home ?Transportation: Private Auto ?Accompanied By: self ?Schedule Follow-up Appointment: Yes ?Clinical Summary of Care: ?Notes ?Patient has an HBO treatment scheduled on 01/24/22 at 10:30 am. ?Electronic Signature(s) ?Signed: 01/23/2022 1:10:07 PM By: Enedina Finner RCP, RRT, CHT ?Entered By: Enedina Finner on 01/23/2022 11:03:03 ?[name] ([id number]) ?-------------------------------------------------------------------------------- ?Vitals Details ?Patient Name: [name], [name] ?Date of Service: 01/23/2022 8:00 AM ?Medical Record Number: [id number] ?Patient Account Number: [id number] ?Date of Birth/Sex: 1966/02/15 (56 y.o. M) ?Treating RN: [name] ?Primary Care Goldie Dimmer: [name] ?Other Clinician: Jacqulyn Bath ?Referring Jaymeson Mengel: [name] ?Treating Stephanee Barcomb/Extender: Jeri Cos ?Weeks in Treatment: 12 ?Vital Signs ?Time Taken: 08:00 ?Temperature (??F): 98.3 ?Height (in): 70 ?Pulse (bpm): 78 ?Weight (lbs): 180.4 ?Respiratory Rate (breaths/min): 16 ?Body Mass Index (BMI): 25.9 ?Blood Pressure (mmHg): 138/72 ?Capillary Blood Glucose (mg/dl): 178 ?Reference Range: 80 - 120 mg / dl ?Electronic Signature(s) ?Signed: 01/23/2022 1:10:07 PM By: Enedina Finner RCP, RRT, CHT ?Entered By: Enedina Finner on 01/23/2022 08:41:43 ?

## 2022-01-24 ENCOUNTER — Encounter: Payer: Medicare Other | Admitting: Physician Assistant

## 2022-01-24 DIAGNOSIS — N186 End stage renal disease: Secondary | ICD-10-CM | POA: Diagnosis not present

## 2022-01-24 DIAGNOSIS — T8131XA Disruption of external operation (surgical) wound, not elsewhere classified, initial encounter: Secondary | ICD-10-CM | POA: Diagnosis not present

## 2022-01-24 DIAGNOSIS — N2581 Secondary hyperparathyroidism of renal origin: Secondary | ICD-10-CM | POA: Diagnosis not present

## 2022-01-24 DIAGNOSIS — I5042 Chronic combined systolic (congestive) and diastolic (congestive) heart failure: Secondary | ICD-10-CM | POA: Diagnosis not present

## 2022-01-24 DIAGNOSIS — I2510 Atherosclerotic heart disease of native coronary artery without angina pectoris: Secondary | ICD-10-CM | POA: Diagnosis not present

## 2022-01-24 DIAGNOSIS — E11621 Type 2 diabetes mellitus with foot ulcer: Secondary | ICD-10-CM | POA: Diagnosis not present

## 2022-01-24 DIAGNOSIS — I96 Gangrene, not elsewhere classified: Secondary | ICD-10-CM | POA: Diagnosis not present

## 2022-01-24 DIAGNOSIS — I10 Essential (primary) hypertension: Secondary | ICD-10-CM | POA: Diagnosis not present

## 2022-01-24 DIAGNOSIS — L97522 Non-pressure chronic ulcer of other part of left foot with fat layer exposed: Secondary | ICD-10-CM | POA: Diagnosis not present

## 2022-01-24 DIAGNOSIS — Z992 Dependence on renal dialysis: Secondary | ICD-10-CM | POA: Diagnosis not present

## 2022-01-24 DIAGNOSIS — L02612 Cutaneous abscess of left foot: Secondary | ICD-10-CM | POA: Diagnosis not present

## 2022-01-24 DIAGNOSIS — D631 Anemia in chronic kidney disease: Secondary | ICD-10-CM | POA: Diagnosis not present

## 2022-01-24 DIAGNOSIS — L97524 Non-pressure chronic ulcer of other part of left foot with necrosis of bone: Secondary | ICD-10-CM | POA: Diagnosis not present

## 2022-01-24 DIAGNOSIS — M86672 Other chronic osteomyelitis, left ankle and foot: Secondary | ICD-10-CM | POA: Diagnosis not present

## 2022-01-24 LAB — GLUCOSE, CAPILLARY
Glucose-Capillary: 163 mg/dL — ABNORMAL HIGH (ref 70–99)
Glucose-Capillary: 146 mg/dL — ABNORMAL HIGH (ref 70–99)

## 2022-01-24 NOTE — Progress Notes (Signed)
Proctor, [place] ([id number]) ?Visit Report for 01/23/2022 ?HBO Details ?Patient Name: [name], [name] ?Date of Service: 01/23/2022 8:00 AM ?Medical Record Number: [id number] ?Patient Account Number: [id number] ?Date of Birth/Sex: May 14, 1966 (56 y.o. 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 ?HBO Treatment Details ?Treatment Number: 51 ?Patient Type: Outpatient ?Chamber Type: Monoplace ?Chamber Serial #: [id number] ?Treatment Protocol: 2.0 ATA with 90 minutes oxygen, and no air breaks ?Treatment Details ?Compression Rate Down: 1.0 psi / minute ?De-Compression Rate Up: 1.5 psi / minute ?Compress Tx Pressure Air breaks and breathing periods Decompress Decompress ?Begins Reached (leave unused spaces blank) Begins Ends ?Chamber Pressure (ATA) 1 2 - - - - - - 2 1 ?Clock Time (24 hr) 08:24 08:41 - - - - - - 10:11 10:21 ?Treatment Length: 117 (minutes) ?Treatment Segments: 4 ?Vital Signs ?Capillary Blood Glucose Reference Range: 80 - 120 mg / dl ?HBO Diabetic Blood Glucose Intervention Range: <131 mg/dl or >249 mg/dl ?Time Vitals Blood Respiratory Capillary Blood Glucose Pulse Action ?Type: ?Pulse: Temperature: ?Taken: ?Pressure: ?Rate: ?Glucose (mg/dl): ?Meter #: Oximetry (%) Taken: ?Pre 08:00 138/72 78 16 98.3 178 1 none per protocol ?Post 10:27 128/72 78 16 98.7 121 1 none per protocol ?Treatment Response ?Treatment Toleration: Well ?Treatment Completion ?Treatment Completed without Adverse Event ?Status: ?Electronic Signature(s) ?Signed: 01/23/2022 1:10:07 PM By: Enedina Finner RCP, RRT, CHT ?Signed: 01/24/2022 5:15:24 PM By: Worthy Keeler  PA-C ?Entered By: Enedina Finner on 01/23/2022 11:01:30 ?Misch, [place] ([id number]) ?-------------------------------------------------------------------------------- ?HBO Safety Checklist Details ?Patient Name: Gohman, Thaniel L. ?Date of Service: 01/23/2022 8:00 AM ?Medical Record Number: [id number] ?Patient Account Number: [id number] ?Date of Birth/Sex: 06/16/1966 (56 y.o. M) ?Treating RN: [name] ?Primary Care Nashya Garlington: [name] ?Other Clinician: Jacqulyn Bath ?Referring Kayvon Mo: [name] ?Treating Akari Defelice/Extender: Jeri Cos ?Weeks in Treatment: 12 ?HBO Safety Checklist Items ?Safety Checklist ?Consent Form Signed ?Patient voided / foley secured and emptied ?When did you last eato 07:00 am ?Last dose of injectable or oral agent n/a ?Ostomy pouch emptied and vented if applicable ?NA ?All implantable devices assessed, documented and approved ?NA ?Intravenous access site secured and place ?NA ?Valuables secured ?Linens and cotton and cotton/polyester blend (less than 51% polyester) ?Personal oil-based products / skin lotions / body lotions removed ?Wigs or hairpieces removed ?NA ?Smoking or tobacco materials removed ?NA ?Books / newspapers / magazines / loose paper removed ?NA ?Cologne, aftershave, perfume and deodorant removed ?Jewelry removed (may wrap wedding band) ?Make-up removed ?NA ?Hair care products removed ?Battery operated devices (external) removed ?NA ?Heating patches and chemical warmers removed ?NA ?Titanium eyewear removed ?NA ?Nail polish cured greater than 10 hours ?NA ?Casting material cured greater than 10 hours ?NA ?Hearing aids removed ?NA ?Loose dentures or partials removed ?NA ?Prosthetics have been removed ?NA ?Patient demonstrates correct use of air break device (if applicable) ?Patient concerns have been addressed ?Patient grounding bracelet on and cord attached to chamber ?Specifics for Inpatients (complete in addition to ?above) ?Medication sheet sent with  patient ?Intravenous medications needed or due during therapy sent with patient ?Drainage tubes (e.g. nasogastric tube or chest tube secured and vented) ?Endotracheal or Tracheotomy tube secured ?Cuff deflated of air and inflated with saline ?Airway suctioned ?Electronic Signature(s) ?Signed: 01/23/2022  1:10:07 PM By: Enedina Finner RCP, RRT, CHT ?Entered By: Enedina Finner on 01/23/2022 08:42:58 ?

## 2022-01-24 NOTE — Progress Notes (Signed)
[name], [name] ([id number]) ?Visit Report for 01/24/2022 ?Arrival Information Details ?Patient Name: [name], [name] ?Date of Service: 01/24/2022 10:30 AM ?Medical Record Number: [id number] ?Patient Account Number: [id number] ?Date of Birth/Sex: December 21, 1965 (56 y.o. M) ?Treating RN: [name] ?Primary Care Izzak Fries: [name] ?Other Clinician: Jacqulyn Bath ?Referring Gray Maugeri: [name] ?Treating Farrin Shadle/Extender: Jeri Cos ?Weeks in Treatment: 12 ?Visit Information History Since Last Visit ?Added or deleted any medications: No ?Patient Arrived: Ambulatory ?Any new allergies or adverse reactions: No ?Arrival Time: 10:30 ?Had a fall or experienced change in No ?Accompanied By: self ?activities of daily living that may affect ?Transfer Assistance: None ?risk of falls: ?Patient Identification Verified: Yes ?Signs or symptoms of abuse/neglect since last visito No ?Secondary Verification Process Completed: Yes ?Hospitalized since last visit: No ?Patient Requires Transmission-Based No ?Implantable device outside of the clinic excluding No ?Precautions: ?cellular tissue based products placed in the center ?Patient Has Alerts: Yes ?since last visit: ?Patient Alerts: Patient on Blood ?Has Dressing in Place as Prescribed: Yes ?Thinner ?Has Footwear/Offloading in Place as Prescribed: Yes ?DIALYSIS RIGHT ARM ?Left: Surgical Shoe with Pressure ?Aspirin 81/Brilinta ?Relief Insole ?Pain Present Now: No ?Electronic Signature(s) ?Signed: 01/24/2022 1:59:08 PM By: Enedina Finner RCP, RRT, CHT ?Entered By: Enedina Finner on 01/24/2022 11:01:26 ?Rafferty, [place] ([id number]) ?-------------------------------------------------------------------------------- ?Encounter Discharge Information Details ?Patient Name: [name], [name] ?Date of Service: 01/24/2022 10:30 AM ?Medical Record Number: [id number] ?Patient Account Number: [id number] ?Date of Birth/Sex: 12-Feb-1966 (56 y.o. M) ?Treating RN: [name] ?Primary Care  Brixton Schnapp: [name] ?Other Clinician: Jacqulyn Bath ?Referring Trystyn Dolley: [name] ?Treating Real Cona/Extender: Jeri Cos ?Weeks in Treatment: 12 ?Encounter Discharge Information Items ?Discharge Condition: Stable ?Ambulatory Status: Ambulatory ?Discharge Destination: Home ?Transportation: Private Auto ?Accompanied By: self ?Schedule Follow-up Appointment: Yes ?Clinical Summary of Care: ?Notes ?Patient has an HBO treatment scheduled on 01/27/22 at 10:30 am. ?Electronic Signature(s) ?Signed: 01/24/2022 1:59:08 PM By: Enedina Finner RCP, RRT, CHT ?Entered By: Enedina Finner on 01/24/2022 13:58:47 ?Bla[place], [place] ([id number]) ?-------------------------------------------------------------------------------- ?Vitals Details ?Patient Name: [name], [name] ?Date of Service: 01/24/2022 10:30 AM ?Medical Record Number: [id number] ?Patient Account Number: [id number] ?Date of Birth/Sex: June 06, 1966 (56 y.o. M) ?Treating RN: [name] ?Primary Care Asti Mackley: [name] ?Other Clinician: Jacqulyn Bath ?Referring Adriel Desrosier: [name] ?Treating Huriel Matt/Extender: Jeri Cos ?Weeks in Treatment: 12 ?Vital Signs ?Time Taken: 10:34 ?Temperature (??F): 98.3 ?Height (in): 70 ?Pulse (bpm): 66 ?Weight (lbs): 180.4 ?Respiratory Rate (breaths/min): 16 ?Body Mass Index (BMI): 25.9 ?Blood Pressure (mmHg): 128/72 ?Capillary Blood Glucose (mg/dl): 163 ?Reference Range: 80 - 120 mg / dl ?Electronic Signature(s) ?Signed: 01/24/2022 1:59:08 PM By: Enedina Finner RCP, RRT, CHT ?Entered By: Enedina Finner on 01/24/2022 11:02:11 ?

## 2022-01-24 NOTE — Progress Notes (Signed)
Metayer, [place] ([id number]) ?Visit Report for 01/24/2022 ?HBO Details ?Patient Name: [name], [name] ?Date of Service: 01/24/2022 10:30 AM ?Medical Record Number: [id number] ?Patient Account Number: [id number] ?Date of Birth/Sex: 1965/11/22 (56 y.o. M) ?Treating RN: [name] ?Primary Care Darthy Manganelli: [name] ?Other Clinician: Jacqulyn Bath ?Referring Jsaon Yoo: [name] ?Treating Daegon Deiss/Extender: Jeri Cos ?Weeks in Treatment: 12 ?HBO Treatment Course Details ?Treatment Course Number: 1 ?Ordering Hildreth Robart: Jeri Cos ?Total Treatments Ordered: 60 ?HBO Treatment Start Date: 11/04/2021 ?HBO Indication: ?Chronic Refractory Osteomyelitis to Left Foot ?HBO Treatment Details ?Treatment Number: 68 ?Patient Type: Outpatient ?Chamber Type: Monoplace ?Chamber Serial #: [id number] ?Treatment Protocol: 2.0 ATA with 90 minutes oxygen, and no air breaks ?Treatment Details ?Compression Rate Down: 1.0 psi / minute ?De-Compression Rate Up: 1.5 psi / minute ?Compress Tx Pressure Air breaks and breathing periods Decompress Decompress ?Begins Reached (leave unused spaces blank) Begins Ends ?Chamber Pressure (ATA) 1 2 - - - - - - 2 1 ?Clock Time (24 hr) 10:48 11:05 - - - - - - 12:36 12:46 ?Treatment Length: 118 (minutes) ?Treatment Segments: 4 ?Vital Signs ?Capillary Blood Glucose Reference Range: 80 - 120 mg / dl ?HBO Diabetic Blood Glucose Intervention Range: <131 mg/dl or >249 mg/dl ?Time Vitals Blood Respiratory Capillary Blood Glucose Pulse Action ?Type: ?Pulse: Temperature: ?Taken: ?Pressure: ?Rate: ?Glucose (mg/dl): ?Meter #: Oximetry (%) Taken: ?Pre 10:34 128/72 66 16 98.3 163 1 none per protocol ?Post 12:50 122/78 72 16 97.8 146 1 none per protocol ?Treatment Response ?Treatment Toleration: Well ?Treatment Completion ?Treatment Completed without Adverse Event ?Status: ?Electronic Signature(s) ?Signed: 01/24/2022 1:59:08 PM By: Enedina Finner RCP, RRT, CHT ?Signed: 01/24/2022 5:15:24 PM By: Worthy Keeler  PA-C ?Entered By: Enedina Finner on 01/24/2022 13:57:35 ?[place], [place] ([id number]) ?-------------------------------------------------------------------------------- ?HBO Safety Checklist Details ?Patient Name: [name], [name] ?Date of Service: 01/24/2022 10:30 AM ?Medical Record Number: [id number] ?Patient Account Number: [id number] ?Date of Birth/Sex: 19-Feb-1966 (56 y.o. M) ?Treating RN: [name] ?Primary Care Roxsana Riding: [name] ?Other Clinician: Jacqulyn Bath ?Referring Cohan Stipes: [name] ?Treating Ignacia Gentzler/Extender: Jeri Cos ?Weeks in Treatment: 12 ?HBO Safety Checklist Items ?Safety Checklist ?Consent Form Signed ?Patient voided / foley secured and emptied ?When did you last eato 10:00 am ?Last dose of injectable or oral agent n/a ?Ostomy pouch emptied and vented if applicable ?NA ?All implantable devices assessed, documented and approved ?NA ?Intravenous access site secured and place ?NA ?Valuables secured ?Linens and cotton and cotton/polyester blend (less than 51% polyester) ?Personal oil-based products / skin lotions / body lotions removed ?Wigs or hairpieces removed ?NA ?Smoking or tobacco materials removed ?NA ?Books / newspapers / magazines / loose paper removed ?NA ?Cologne, aftershave, perfume and deodorant removed ?Jewelry removed (may wrap wedding band) ?Make-up removed ?NA ?Hair care products removed ?Battery operated devices (external) removed ?NA ?Heating patches and chemical warmers removed ?NA ?Titanium eyewear removed ?NA ?Nail polish cured greater than 10 hours ?NA ?Casting material cured greater than 10 hours ?NA ?Hearing aids removed ?NA ?Loose dentures or partials removed ?NA ?Prosthetics have been removed ?NA ?Patient demonstrates correct use of air break device (if applicable) ?Patient concerns have been addressed ?Patient grounding bracelet on and cord attached to chamber ?Specifics for Inpatients (complete in addition to ?above) ?Medication sheet sent with  patient ?Intravenous medications needed or due during therapy sent with patient ?Drainage tubes (e.g. nasogastric tube or chest tube secured and vented) ?Endotracheal or Tracheotomy tube secured ?Cuff deflated of air and inflated with saline ?Airway suctioned ?Electronic Signature(s) ?Signed: 01/24/2022  1:59:08 PM By: Enedina Finner RCP, RRT, CHT ?Entered By: Enedina Finner on 01/24/2022 11:03:21 ?

## 2022-01-24 NOTE — Progress Notes (Addendum)
Resh, [place] ([id number]) Visit Report for 01/24/2022 Chief Complaint Document Details Patient Name: [name], [name]. Date of Service: 01/24/2022 1:15 PM Medical Record Number: [id number] Patient Account Number: [id number] Date of Birth/Sex: 1966/07/05 (56 y.o. M) Treating RN: [name] Primary Care Provider: [name] Other Clinician: Referring Provider: [name] Treating Provider/Extender: [name] in Treatment: 12 Information Obtained from: Patient Chief Complaint HBO evaluation for treatment on the left foot diabetic ulcer with osteomyelitis. Patient is currently under our care as well for his foot. Electronic Signature(s) Signed: 01/24/2022 1:42:02 PM By: Worthy Keeler PA-C Entered By: Worthy Keeler on 01/24/2022 13:42:01 [name], [name] ([id number]) -------------------------------------------------------------------------------- Debridement Details Patient Name: [name], [name] Date of Service: 01/24/2022 1:15 PM Medical Record Number: [id number] Patient Account Number: [id number] Date of Birth/Sex: 28-Dec-1965 (56 y.o. M) Treating RN: [name] Primary Care Provider: [name] Other Clinician: Referring Provider: [name] Treating Provider/Extender: [name] in Treatment: 12 Debridement Performed for Wound #1 Left Amputation Site - Digit Assessment: Performed By: Physician Tommie Sams., PA-C Debridement Type: Debridement Level of Consciousness (Pre- Awake and Alert procedure): Pre-procedure Verification/Time Out Yes - 13:45 Taken: Start Time: 13:45 Total Area Debrided (L x W): 2 (cm) x 2 (cm) = 4 (cm) Tissue and other material Viable, Non-Viable, Eschar, Slough, Subcutaneous, Tendon, Skin: Dermis , Skin: Epidermis, Slough debrided: Level: Skin/Subcutaneous Tissue/Muscle Debridement Description: Excisional Instrument: Curette Bleeding: Minimum Hemostasis Achieved: Pressure End Time: 13:55 Procedural Pain: 0 Post  Procedural Pain: 0 Response to Treatment: Procedure was tolerated well Level of Consciousness (Post- Awake and Alert procedure): Post Debridement Measurements of Total Wound Length: (cm) 4 Width: (cm) 1.7 Depth: (cm) 1.1 Volume: (cm) 5.875 Character of Wound/Ulcer Post Debridement: Improved Post Procedure Diagnosis Same as Pre-procedure Electronic Signature(s) Signed: 01/24/2022 5:15:24 PM By: Worthy Keeler PA-C Signed: 01/28/2022 8:57:31 AM By: [name] RN Entered By: [name] on 01/24/2022 13:53:56 [name] ([id number]) -------------------------------------------------------------------------------- Debridement Details Patient Name: [name], [name] Date of Service: 01/24/2022 1:15 PM Medical Record Number: [id number] Patient Account Number: [id number] Date of Birth/Sex: Oct 03, 1966 (56 y.o. M) Treating RN: [name] Primary Care Provider: [name] Other Clinician: Referring Provider: [name] Treating Provider/Extender: [name] in Treatment: 12 Debridement Performed for Wound #2 Left,Lateral Foot Assessment: Performed By: Physician Tommie Sams., PA-C Debridement Type: Debridement Severity of Tissue Pre Debridement: Fat layer exposed Level of Consciousness (Pre- Awake and Alert procedure): Pre-procedure Verification/Time Out Yes - 13:45 Taken: Start Time: 13:45 Total Area Debrided (L x W): 2 (cm) x 2 (cm) = 4 (cm) Tissue and other material Viable, Non-Viable, Eschar, Slough, Subcutaneous, Tendon, Skin: Dermis , Skin: Epidermis, Slough debrided: Level: Skin/Subcutaneous Tissue/Muscle Debridement Description: Excisional Instrument: Curette Bleeding: Moderate Hemostasis Achieved: Silver Nitrate End Time: 13:55 Procedural Pain: 0 Post Procedural Pain: 0 Response to Treatment: Procedure was tolerated well Level of Consciousness (Post- Awake and Alert procedure): Post Debridement Measurements of Total Wound Length: (cm)  3 Width: (cm) 2 Depth: (cm) 1.7 Volume: (cm) 8.011 Character of Wound/Ulcer Post Debridement: Improved Severity of Tissue Post Debridement: Fat layer exposed Post Procedure Diagnosis Same as Pre-procedure Electronic Signature(s) Signed: 01/24/2022 5:15:24 PM By: Worthy Keeler PA-C Signed: 01/28/2022 8:57:31 AM By: [name] RN Entered By: [name] on 01/24/2022 13:55:48 [name], [name] ([id number]) -------------------------------------------------------------------------------- HPI Details Patient Name: [name], [name] Date of Service: 01/24/2022 1:15 PM Medical Record Number: [id number] Patient Account Number: [id number] Date of Birth/Sex: 04-22-1966 (56 y.o. M) Treating RN:  [name] Primary Care Provider: [name] Other Clinician: Referring Provider: [name] Treating Provider/Extender: [name] in Treatment: 12 History of Present Illness HPI Description: 10/31/2021 patient presents today for evaluation here in our clinic. He is actually being evaluated for hyperbaric oxygen therapy only. He has been seen at [facility] up to this point he is under the care of the vascular surgery specialty. Subsequently he is also under the care of the hyperbaric center there. With that being said that due to the fact that he is a dialysis patient he was actually recommended to be transferred to [place] for his treatments he actually lives in [place] which is where his dialysis is. It was a hardship for him to try to get from [place] especially on dialysis days all the way to [facility] get into the facility for his treatment and get back home he was literally spending all day long. He has had just 4 treatments there at [facility] thus far based on what I see in these have not been continued while he awaits approval here in our clinic. Subsequently I do have records for review that will be included in the HPI predetermination review and attached to this chart as well. I  will not duplicate that here. 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 Nonetheless he continues to have exposed bone with marked soft tissue swelling compatible with osteomyelitis. This is due to the irregularity of the head of the metatarsal as well. Nonetheless along with having associated cellulitis he is also good to be seen by infectious disease and is currently on antibiotics for this as well. Again that will be detailed in the pretreatment review section which I will attach to this note as well. He does currently have a wound VAC in place and again the wound was not evaluated here in the clinic as that is being completely managed by home health and [facility]. The patient does have a history of diabetes mellitus type 2, hypertension, coronary artery disease, and congestive heart failure. He also has cataracts of both eyes but no evidence of glaucoma on his most recent eye exam. 11/22/2021 we have been seeing this gentleman for hyperbaric oxygen therapy but have not actually evaluated his wound up to this point this was being managed by the wound care and vascular team I presumed at [place] at least that is what has been told to be previous. Nonetheless at this point I got a call from Atlee Abide who is a Librarian, academic at the [facility] vascular clinic with Dr. Durene Fruits and subsequently they were wanting to know if I could take over wound care for this patient. Apparently they do not have the ability for an office debridement which again I completely understand but nonetheless definitely is not an integral part of his healing along with the hyperbarics. Subsequently I discussed with the patient today that I do believe he would benefit going ahead and proceeding with evaluation here in the clinic for that reason I did go ahead and see  him today to get things started. There is also been some confusion about the issues with his home health nurse and getting measurements to [facility] for the wound VAC in order to continue his wound VAC therapy. With that being said after I see him today we will make a determination  on what to do with wound VAC we can definitely send measurements to [facility] but again the bigger question is whether this is the best way to go or not. 11/28/2021 upon evaluation today patient appears to be doing decently well in regard to his wound. He has been tolerating the dressing changes with the Dakin's moistened gauze dressing which I think is doing a good job. Fortunately I do not see any signs of active infection locally nor systemically at this point. In general I think that he is actually making excellent progress which is great news. 2/17; seen in conjunction with HBO today. 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 Much improved condition of the wound surface. On the lateral foot and eschared area that he has been using Betadine. Tolerating HBO well 12/13/2021 upon evaluation today patient appears to be doing pretty well in regard to his foot ulcer. I am actually much more pleased currently that I been with the way the appearance looks today. The wound bed does show some signs of slough and biofilm on the surface of the wound this is minimal I Minna perform debridement the I will do so very carefully due to the fact that to be honest the patient had a significant amount of bleeding in the past being on the [place] he had a difficult time clotting when I debrided him previously quite significantly. Fortunately I do not see any signs of active infection at this time which is great news. No fevers, chills, nausea, vomiting, or diarrhea. 12/20/2021 upon evaluation today patient actually appears to be showing some signs of excellent improvement here. I am very pleased with where things stand  currently. There does not appear to be any evidence of active infection locally nor systemically which is great news and I do believe that with the hyperbaric oxygen therapy he is actually making excellent progress. 3/9; patient presents for follow-up. He continues to do HBO therapy without issues. He has completed 34 out of 40 sessions. He continues to use Dakin's wet-to-dry to the amputation site and Betadine to the lateral foot wound. he currently denies signs of infection. 01/03/2022 upon evaluation today patient appears to be doing better in regard to his distal foot ulcer. Unfortunately the lateral foot is a different story this area has softened up and is getting need to be debrided away today. I Georgina Peer do this with scissors and forceps and was able to clear that out that will be detailed below. Otherwise I feel like the Dakin's moistened gauze dressing is doing an awesome job for him and he is making significantly improvements here. 01/10/2022 upon evaluation today patient continues to have some of the necrotic tissue loosen up on the lateral portion of his foot the main wound that we have been treating is actually doing quite well. Overall very pleased with where things stand in general. No fevers, chills, nausea, vomiting, or diarrhea. 01/17/2022 upon evaluation today patient appears to be doing somewhat poorly in regard to the lateral foot wound. The main wound that we initially were seeing him for looks to be doing great. This lateral foot has worsened and unfortunately it appears to potentially have some signs of infection here. I do believe that we may need to see about getting started on some IV antibiotics he is in dialysis so they could potentially do this for him, Monday. Nonetheless I want to try to get that started ASAP. Again I would prefer him be  initiated with IV vancomycin and cefepime just as an empiric broad-spectrum until we can get the culture results back at least.  Subsequently also think that this should be done sooner rather than later due to the fact that his leg is more swollen and overall this seems to have progressed fairly rapidly since last week I do not want this to get worse. He voiced understanding. 01-24-2022 upon evaluation today patient appears to be doing well with regard to his main ulcer that we have been seeing him for. I am very pleased with where things stand currently. He still continues to have bleeding if we do any type of aggressive sharp debridement there for him Hoke, [name] ([id number]) having to be very careful using Dakin's solution along with debridement as I can here in the office. Overall the patient seems to be doing well. Unfortunately the lateral foot wound still has progressed a little bit further. We did get him on antibiotics starting this past Monday I think the drainage looks much better and overall I am seeing improvement but again this was already broken down to a certain degree I am hopeful by next week this will really start turnaround for [place]. We will also get a send him for an x-ray to evaluate for any signs of osteomyelitis on this lateral foot. Electronic Signature(s) Signed: 01/24/2022 2:00:20 PM By: Worthy Keeler PA-C Entered By: Worthy Keeler on 01/24/2022 14:00:19 Rupard, [name] ([id number]) -------------------------------------------------------------------------------- Physical Exam Details Patient Name: Mooneyham, [name] Date of Service: 01/24/2022 1:15 PM Medical Record Number: [id number] Patient Account Number: [id number] Date of Birth/Sex: [birth year] (56 y.o. M) Treating RN: [name] Primary Care Provider: [name] Other Clinician: Referring Provider: [name] Treating Provider/Extender: [name] in Treatment: 40 Constitutional Well-nourished and well-hydrated in no acute distress. Respiratory normal breathing without difficulty. Psychiatric this patient is able to make  decisions and demonstrates good insight into disease process. Alert and Oriented x 3. pleasant and cooperative. Notes Upon inspection patient's wound bed actually showed signs of good granulation in regard to his main wound and I am happy in that regard I did perform some light debridement to clear away some of the necrotic debris he tolerated that today without complication postdebridement the wound bed appears to be doing much better. In regard to the lateral foot wound this is still something that I did perform some debridement using scissors and forceps regarding with that being said I am not really quite at the point of feeling like we are out of the woods yet but I think we are on the right track I am hopeful by next week this will be doing much better. Electronic Signature(s) Signed: 01/24/2022 2:01:02 PM By: Worthy Keeler PA-C Entered By: Worthy Keeler on 01/24/2022 14:01:02 Kindley, [name] ([id number]) -------------------------------------------------------------------------------- Physician Orders Details Patient Name: Lose, [name] Date of Service: 01/24/2022 1:15 PM Medical Record Number: [id number] Patient Account Number: [id number] Date of Birth/Sex: August 01, 1966 (56 y.o. M) Treating RN: [name] Primary Care Provider: [name] Other Clinician: Referring Provider: [name] Treating Provider/Extender: [name] in Treatment: 12 Verbal / Phone Orders: No Diagnosis Coding ICD-10 Coding Code Description E11.621 Type 2 diabetes mellitus with foot ulcer 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e M86.672 Other chronic osteomyelitis, left ankle and foot I10 Essential (primary) hypertension I25.10 Atherosclerotic heart disease of native coronary artery without angina pectoris I50.42 Chronic combined systolic (congestive) and diastolic (congestive)  heart failure R26.89 Other abnormalities of gait and mobility Follow-up  Appointments o Return Appointment in 1 week. - provider visits at wound center o Return Appointment in: - HBO- (daily Monday -Friday for HBO Treatments) [place] for wound care. May utilize formulary equivalent dressing for wound treatment orders unless otherwise specified. Home Health Nurse may visit PRN to address patientos wound care needs. o **Please direct any NON-WOUND related issues/requests for orders to patient's Primary Care Physician. **If current dressing causes regression in wound condition, may D/C ordered dressing product/s and apply Normal Saline Moist Dressing daily until next [place] or Other MD appointment. **Notify Wound Healing Center of regression in wound condition at [phone]. o Other Home Health Orders/Instructions: - HOLD WOUND VAC AT THIS TIME Bathing/ Shower/ Hygiene o Clean wound with Normal Saline or wound cleanser. o May shower with wound dressing protected with water repellent cover or cast protector. o No tub bath. Off-Loading Left Lower Extremity o Foot Defender o - size large Additional Orders / Instructions o Follow Nutritious Diet and Increase Protein Intake o Other: - Follow vascular, nephrology and PCP for wound care and other needs Dialysis appt as scheduled M-W-F Hyperbaric Oxygen Therapy o Indication and location: - Left 1st and 2nd Ray Amputation osteomyelitis o If appropriate for treatment, begin HBOT per protocol: o 2.0 ATA for 90 Minutes without Air Breaks o One treatment per day (delivered Monday through Friday unless otherwise specified in Special Instructions below): o Total # of Treatments: - 60 o Finger stick Blood Glucose Pre- and Post- HBOT Treatment. o Follow Hyperbaric Oxygen Glycemia Protocol Wound Treatment Wound #1 - Amputation Site - Digit Wound Laterality: Left Cleanser: Normal Saline 1 x Per Day/30  Days Discharge Instructions: Wash your hands with soap and water. Remove old dressing, discard into plastic bag and place into trash. Cleanse the wound with Normal Saline prior to applying a clean dressing using gauze sponges, not tissues or cotton balls. Do not scrub or use excessive force. Pat dry using gauze sponges, not tissue or cotton balls. Cleanser: Vashe 5.8 (oz) 1 x Per Day/30 Days Klutz, Mayes L. ([id number]) Discharge Instructions: Use vashe 5.8 (oz) as directed Cleanser: Wound Cleanser 1 x Per Day/30 Days Discharge Instructions: Wash your hands with soap and water. Remove old dressing, discard into plastic bag and place into trash. Cleanse the wound with Wound Cleanser prior to applying a clean dressing using gauze sponges, not tissues or cotton balls. Do not scrub or use excessive force. Pat dry using gauze sponges, not tissue or cotton balls. Primary Dressing: Gauze 1 x Per Day/30 Days Discharge Instructions: VASHE/DAKINS-As directed: dry, moistened with saline or moistened with Dakins Solution Secondary Dressing: ABD Pad 5x9 (in/in) 1 x Per Day/30 Days Discharge Instructions: Cover with ABD pad Secured With: Medipore Tape - 8M Medipore H Soft Cloth Surgical Tape, 2x2 (in/yd) 1 x Per Day/30 Days Secured With: Kerlix Roll Sterile or Non-Sterile 6-ply 4.5x4 (yd/yd) 1 x Per Day/30 Days Discharge Instructions: Apply Kerlix as directed Wound #2 - Foot Wound Laterality: Left, Lateral Primary Dressing: Gauze 1 x Per Day/30 Days Discharge Instructions: moistened with Dakins Solution or Vashe Secondary Dressing: ABD Pad 5x9 (in/in) 1 x Per Day/30 Days Discharge Instructions: Cover with ABD pad Secondary Dressing: Kerlix 4.5 x 4.1 (in/yd) 1 x Per Day/30 Days Discharge Instructions: lightly Secured With: Medipore Tape - 8M Medipore H Soft Cloth Surgical Tape, 2x2 (in/yd) 1 x  Per Day/14 Days Radiology o MRI without Contrast - non healing wound - (ICD10 E11.621 - Type 2 diabetes  mellitus with foot ulcer) GLYCEMIA INTERVENTIONS PROTOCOL PRE-HBO GLYCEMIA INTERVENTIONS ACTION INTERVENTION Obtain pre-HBO capillary blood glucose (ensure 1 physician order is in chart). A. Notify HBO physician and await physician orders. 2 If result is 70 mg/dl or below: B. If the result meets the hospital definition of a critical result, follow hospital policy. A. Give patient an 8 ounce Glucerna Shake, an 8 ounce Ensure, or 8 ounces of a Glucerna/Ensure equivalent dietary supplement*. B. Wait 30 minutes. If result is 71 mg/dl to 130 mg/dl: C. Retest patientos capillary blood glucose (CBG). D. If result greater than or equal to 110 mg/dl, proceed with HBO. If result [name] than 110 mg/dl, notify HBO physician and consider holding HBO. If result is 131 mg/dl to 249 mg/dl: A. Proceed with HBO. A. Notify HBO physician and await physician orders. B. It is recommended to hold HBO and do If result is 250 mg/dl or greater: blood/urine ketone testing. C. If the result meets the hospital definition of a critical result, follow hospital policy. POST-HBO GLYCEMIA INTERVENTIONS ACTION INTERVENTION Obtain post HBO capillary blood glucose (ensure 1 physician order is in chart). A. Notify HBO physician and await physician orders. 2 If result is 70 mg/dl or below: B. If the result meets the hospital definition of a critical result, follow hospital policy. If result is 71 mg/dl to 100 mg/dl: [name], [name] ([id number]) A. Give patient an 8 ounce Glucerna Shake, an 8 ounce Ensure, or 8 ounces of a Glucerna/Ensure equivalent dietary supplement*. B. Wait 15 minutes for symptoms of hypoglycemia (i.e. nervousness, anxiety, sweating, chills, clamminess, irritability, confusion, tachycardia or dizziness). C. If patient asymptomatic, discharge patient. If patient symptomatic, repeat capillary blood glucose (CBG) and notify HBO physician. If result is 101 mg/dl to 249 mg/dl: A.  Discharge patient. A. Notify HBO physician and await physician orders. B. It is recommended to do blood/urine If result is 250 mg/dl or greater: ketone testing. C. If the result meets the hospital definition of a critical result, follow hospital policy. *Juice or candies are NOT equivalent products. If patient refuses the Glucerna or Ensure, please consult the hospital dietitian for an appropriate substitute. Electronic Signature(s) Signed: 02/14/2022 1:52:28 PM By: [name] RN Signed: 03/18/2022 12:10:16 PM By: Worthy Keeler PA-C Previous Signature: 01/24/2022 5:15:24 PM Version By: Worthy Keeler PA-C Previous Signature: 01/28/2022 8:57:31 AM Version By: [name] RN Entered By: [name] on 02/14/2022 13:42:09 Sherrill, Dylon L. ([id number]) -------------------------------------------------------------------------------- Problem List Details Patient Name: [name], [name] Date of Service: 01/24/2022 1:15 PM Medical Record Number: [id number] Patient Account Number: [id number] Date of Birth/Sex: [birth year] (56 y.o. M) Treating RN: [name] Primary Care Provider: [name] Other Clinician: Referring Provider: [name] Treating Provider/Extender: [name] in Treatment: 12 Active Problems ICD-10 Encounter Code Description Active Date MDM Diagnosis E11.621 Type 2 diabetes mellitus with foot ulcer 10/31/2021 No Yes L97.524 Non-pressure chronic ulcer of other part of left foot with necrosis of 10/31/2021 No Yes bone M86.672 Other chronic osteomyelitis, left ankle and foot 10/31/2021 No Yes I10 Essential (primary) hypertension 10/31/2021 No Yes I25.10 Atherosclerotic heart disease of native coronary artery without angina 10/31/2021 No Yes pectoris I50.42 Chronic combined systolic (congestive) and diastolic (congestive) heart 10/31/2021 No Yes failure R26.89 Other abnormalities of gait and mobility 01/17/2022 No Yes Inactive Problems Resolved Problems Electronic  Signature(s) Signed: 01/24/2022 1:41:54 PM By: Worthy Keeler  PA-C Entered By: Worthy Keeler on 01/24/2022 13:41:53 [name], [name] ([id number]) -------------------------------------------------------------------------------- Progress Note Details Patient Name: Sills, Traeton L. Date of Service: 01/24/2022 1:15 PM Medical Record Number: [id number] Patient Account Number: [id number] Date of Birth/Sex: 1966/09/02 (56 y.o. M) Treating RN: [name] Primary Care Provider: [name] Other Clinician: Referring Provider: [name] Treating Provider/Extender: [name] in Treatment: 12 Subjective Chief Complaint Information obtained from Patient HBO evaluation for treatment on the left foot diabetic ulcer with osteomyelitis. Patient is currently under our care as well for his foot. History of Present Illness (HPI) 10/31/2021 patient presents today for evaluation here in our clinic. He is actually being evaluated for hyperbaric oxygen therapy only. He has been seen at [facility] up to this point he is under the care of the vascular surgery specialty. Subsequently he is also under the care of the hyperbaric center there. With that being said that due to the fact that he is a dialysis patient he was actually recommended to be transferred to [place] for his treatments he actually lives in [place] which is where his dialysis is. It was a hardship for him to try to get from [place] especially on dialysis days all the way to [facility] get into the facility for his treatment and get back home he was literally spending all day long. He has had just 4 treatments there at [facility] thus far based on what I see in these have not been continued while he awaits approval here in our clinic. Subsequently I do have records for review that will be included in the HPI predetermination review and attached to this chart as well. I will not duplicate that here. 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 Nonetheless he continues to have exposed bone with marked soft tissue swelling compatible with osteomyelitis. This is due to the irregularity of the head of the metatarsal as well. Nonetheless along with having associated cellulitis he is also good to be seen by infectious disease and is currently on antibiotics for this as well. Again that will be detailed in the pretreatment review section which I will attach to this note as well. He does currently have a wound VAC in place and again the wound was not evaluated here in the clinic as that is being completely managed by home health and [facility]. The patient does have a history of diabetes mellitus type 2, hypertension, coronary artery disease, and congestive heart failure. He also has cataracts of both eyes but no evidence of glaucoma on his most recent eye exam. 11/22/2021 we have been seeing this gentleman for hyperbaric oxygen therapy but have not actually evaluated his wound up to this point this was being managed by the wound care and vascular team I presumed at [place] at least that is what has been told to be previous. Nonetheless at this point I got a call from Atlee Abide who is a Librarian, academic at the [facility] vascular clinic with Dr. Durene Fruits and subsequently they were wanting to know if I could take over wound care for this patient. Apparently they do not have the ability for an office debridement which again I completely understand but nonetheless definitely is not an integral part of his healing along with the hyperbarics. Subsequently I discussed with the patient today that I do believe  he would benefit going ahead and proceeding with evaluation here in the clinic for that reason I did go ahead and see him today to get things started. There is also been some confusion about  the issues with his home health nurse and getting measurements to [facility] for the wound VAC in order to continue his wound VAC therapy. With that being said after I see him today we will make a determination on what to do with wound VAC we can definitely send measurements to [facility] but again the bigger question is whether this is the best way to go or not. 11/28/2021 upon evaluation today patient appears to be doing decently well in regard to his wound. He has been tolerating the dressing changes with the Dakin's moistened gauze dressing which I think is doing a good job. Fortunately I do not see any signs of active infection locally nor systemically at this point. In general I think that he is actually making excellent progress which is great news. 2/17; seen in conjunction with HBO today. 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 Much improved condition of the wound surface. On the lateral foot and eschared area that he has been using Betadine. Tolerating HBO well 12/13/2021 upon evaluation today patient appears to be doing pretty well in regard to his foot ulcer. I am actually much more pleased currently that I been with the way the appearance looks today. The wound bed does show some signs of slough and biofilm on the surface of the wound this is minimal I Minna perform debridement the I will do so very carefully due to the fact that to be honest the patient had a significant amount of bleeding in the past being on the [place] he had a difficult time clotting when I debrided him previously quite significantly. Fortunately I do not see any signs of active infection at this time which is great news. No fevers, chills, nausea, vomiting, or diarrhea. 12/20/2021 upon evaluation today patient actually appears to be showing some signs of excellent improvement here. I am very pleased with where things stand currently. There does not appear to be any evidence of active infection  locally nor systemically which is great news and I do believe that with the hyperbaric oxygen therapy he is actually making excellent progress. 3/9; patient presents for follow-up. He continues to do HBO therapy without issues. He has completed 34 out of 40 sessions. He continues to use Dakin's wet-to-dry to the amputation site and Betadine to the lateral foot wound. he currently denies signs of infection. 01/03/2022 upon evaluation today patient appears to be doing better in regard to his distal foot ulcer. Unfortunately the lateral foot is a different story this area has softened up and is getting need to be debrided away today. I Georgina Peer do this with scissors and forceps and was able to clear that out that will be detailed below. Otherwise I feel like the Dakin's moistened gauze dressing is doing an awesome job for him and he is making significantly improvements here. 01/10/2022 upon evaluation today patient continues to have some of the necrotic tissue loosen up on the lateral portion of his foot the main wound that we have been treating is actually doing quite well. Overall very pleased with where things stand in general. No fevers, chills, nausea, vomiting, or diarrhea. 01/17/2022 upon evaluation today patient appears to be doing somewhat poorly in regard to the lateral foot wound. The main wound  that we initially were seeing him for looks to be doing great. This lateral foot has worsened and unfortunately it appears to potentially have some signs of infection here. I do believe that we may need to see about getting started on some IV antibiotics he is in dialysis so they could potentially do this for him, Monday. Nonetheless I want to try to get that started ASAP. Again I would prefer him be initiated with IV vancomycin and cefepime just as an empiric broad-spectrum until we can get the culture results back at least. Subsequently also think that this should be done sooner rather [name], [name]  ([id number]) than later due to the fact that his leg is more swollen and overall this seems to have progressed fairly rapidly since last week I do not want this to get worse. He voiced understanding. 01-24-2022 upon evaluation today patient appears to be doing well with regard to his main ulcer that we have been seeing him for. I am very pleased with where things stand currently. He still continues to have bleeding if we do any type of aggressive sharp debridement there for him having to be very careful using Dakin's solution along with debridement as I can here in the office. Overall the patient seems to be doing well. Unfortunately the lateral foot wound still has progressed a little bit further. We did get him on antibiotics starting this past Monday I think the drainage looks much better and overall I am seeing improvement but again this was already broken down to a certain degree I am hopeful by next week this will really start turnaround for [place]. We will also get a send him for an x-ray to evaluate for any signs of osteomyelitis on this lateral foot. Objective Constitutional Well-nourished and well-hydrated in no acute distress. Vitals Time Taken: 1:20 PM, Height: 70 in, Weight: 180.4 lbs, BMI: 25.9, Temperature: 97.8 F, Pulse: 72 bpm, Respiratory Rate: 16 breaths/min, Blood Pressure: 122/78 mmHg. Respiratory normal breathing without difficulty. Psychiatric this patient is able to make decisions and demonstrates good insight into disease process. Alert and Oriented x 3. pleasant and cooperative. General Notes: Upon inspection patient's wound bed actually showed signs of good granulation in regard to his main wound and I am happy in that regard I did perform some light debridement to clear away some of the necrotic debris he tolerated that today without complication postdebridement the wound bed appears to be doing much better. In regard to the lateral foot wound this is still something that  I did perform some debridement using scissors and forceps regarding with that being said I am not really quite at the point of feeling like we are out of the woods yet but I think we are on the right track I am hopeful by next week this will be doing much better. Integumentary (Hair, Skin) Wound #1 status is Open. Original cause of wound was Surgical Injury. The date acquired was: 09/09/2021. The wound has been in treatment 9 weeks. 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 The wound measures 4cm length x 1.7cm width x 1.1cm depth; 5.341cm^2 area and 5.875cm^3 volume. There is muscle, Fat Layer (Subcutaneous Tissue), and fascia exposed. There is no tunneling or undermining noted. There is a medium amount of serous drainage noted. There is medium (34-66%) red granulation within the wound bed. There is a medium (34- 66%) amount of necrotic tissue within the wound bed including Adherent Slough. Wound #2 status is Open.  Original cause of wound was Blister. The date acquired was: 10/28/2021. The wound has been in treatment 8 weeks. The wound is located on the Left,Lateral Foot. The wound measures 3cm length x 2cm width x 1.7cm depth; 4.712cm^2 area and 8.011cm^3 volume. There is no tunneling or undermining noted. There is a medium amount of serosanguineous drainage noted. There is no granulation within the wound bed. There is a large (67-100%) amount of necrotic tissue within the wound bed including Eschar and Adherent Slough. 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  ([id number]) Wound #1 Pre-procedure diagnosis of Wound #1 is an Open Surgical Wound located on the Left Amputation Site - Digit . There was a Excisional Skin/Subcutaneous Tissue/Muscle Debridement with a total area of 4 sq cm performed by Tommie Sams., PA-C. With the following instrument(s): Curette to remove Viable and Non-Viable tissue/material. Material removed includes Eschar, Tendon, Subcutaneous Tissue, Slough, Skin: Dermis, and Skin: Epidermis. No specimens were taken. A time out was conducted at 13:45, prior to the start of the procedure. A Minimum amount of bleeding was controlled with Pressure. The procedure was tolerated well with a pain level of 0 throughout and a pain level of 0 following the procedure. Post Debridement Measurements: 4cm length x 1.7cm width x 1.1cm depth; 5.875cm^3 volume. Character of Wound/Ulcer Post Debridement is improved. Post procedure Diagnosis Wound #1: Same as Pre-Procedure Wound #2 Pre-procedure diagnosis of Wound #2 is a Diabetic Wound/Ulcer of the Lower Extremity located on the Left,Lateral Foot .Severity of Tissue Pre Debridement is: Fat layer exposed. There was a Excisional Skin/Subcutaneous Tissue/Muscle Debridement with a total area of 4 sq cm performed by Tommie Sams., PA-C. With the following instrument(s): Curette to remove Viable and Non-Viable tissue/material. Material removed includes Eschar, Tendon, Subcutaneous Tissue, Slough, Skin: Dermis, and Skin: Epidermis. No specimens were taken. A time out was conducted at 13:45, prior to the start of the procedure. A Moderate amount of bleeding was controlled with Silver Nitrate. The procedure was tolerated well with a pain level of 0 throughout and a pain level of 0 following the procedure. Post Debridement Measurements: 3cm length x 2cm width x 1.7cm depth; 8.011cm^3 volume. Character of Wound/Ulcer Post Debridement is improved. Severity of Tissue Post Debridement is: Fat layer exposed. Post  procedure Diagnosis Wound #2: Same as Pre-Procedure Plan Follow-up Appointments: Return Appointment in 1 week. - provider visits at wound center Return Appointment in: - HBO- (daily Monday -Friday for HBO Treatments) Home Health: [place] for wound care. May utilize formulary equivalent dressing for wound treatment orders unless otherwise specified. Home Health Nurse may visit [facility] to address patient s wound care needs. **Please direct any NON-WOUND related issues/requests for orders to patient's Primary Care Physician. **If current dressing causes regression in wound condition, may D/C ordered dressing product/s and apply Normal Saline Moist Dressing daily until next [place] or Other MD appointment. **Notify Wound Healing Center of regression in wound condition at [phone]. Other Home Health Orders/Instructions: - HOLD WOUND VAC AT THIS TIME Bathing/ Shower/ Hygiene: Clean wound with Normal Saline or wound cleanser. May shower with wound dressing protected with water  repellent cover or cast protector. No tub bath. Off-Loading: Foot Defender  - size large Additional Orders / Instructions: Follow Nutritious Diet and Increase Protein Intake Other: - Follow vascular, nephrology and PCP for wound care and other needs Dialysis appt as scheduled M-W-F Hyperbaric Oxygen Therapy: Indication and location: - Left 1st and 2nd Ray Amputation osteomyelitis If appropriate for treatment, begin HBOT per protocol: 2.0 ATA for 90 Minutes without Air Breaks One treatment per day (delivered Monday through Friday unless otherwise specified in Special Instructions below): Total # of Treatments: - 60 Finger stick Blood Glucose Pre- and Post- HBOT Treatment. Follow Hyperbaric Oxygen Glycemia Protocol WOUND #1: - Amputation Site - Digit Wound Laterality: Left Cleanser: Normal Saline 1 x Per Day/30 Days Discharge Instructions: Wash your hands with  soap and water. Remove old dressing, discard into plastic bag and place into trash. Cleanse the wound with Normal Saline prior to applying a clean dressing using gauze sponges, not tissues or cotton balls. Do not scrub or use excessive force. Pat dry using gauze sponges, not tissue or cotton balls. Cleanser: Vashe 5.8 (oz) 1 x Per Day/30 Days Discharge Instructions: Use vashe 5.8 (oz) as directed Cleanser: Wound Cleanser 1 x Per Day/30 Days Discharge Instructions: Wash your hands with soap and water. Remove old dressing, discard into plastic bag and place into trash. Cleanse the wound with Wound Cleanser prior to applying a clean dressing using gauze sponges, not tissues or cotton balls. Do not scrub or use excessive force. Pat dry using gauze sponges, not tissue or cotton balls. Primary Dressing: Gauze 1 x Per Day/30 Days Discharge Instructions: VASHE/DAKINS-As directed: dry, moistened with saline or moistened with Dakins Solution Secondary Dressing: ABD Pad 5x9 (in/in) 1 x Per Day/30 Days Discharge Instructions: Cover with ABD pad Secured With: Medipore Tape - 38M Medipore H Soft Cloth Surgical Tape, 2x2 (in/yd) 1 x Per Day/30 Days Secured With: Kerlix Roll Sterile or Non-Sterile 6-ply 4.5x4 (yd/yd) 1 x Per Day/30 Days Discharge Instructions: Apply Kerlix as directed WOUND #2: - Foot Wound Laterality: Left, Lateral Primary Dressing: Gauze 1 x Per Day/30 Days Liberatore, Temiloluwa L. ([id number]) Discharge Instructions: moistened with Dakins Solution or Vashe Secondary Dressing: ABD Pad 5x9 (in/in) 1 x Per Day/30 Days Discharge Instructions: Cover with ABD pad Secondary Dressing: Kerlix 4.5 x 4.1 (in/yd) 1 x Per Day/30 Days Discharge Instructions: lightly Secured With: Medipore Tape - 38M Medipore H Soft Cloth Surgical Tape, 2x2 (in/yd) 1 x Per Day/30 Days 1. Would recommend currently that we continue with the Dakin's moistened gauze dressing I think this still can be the best way to go. 2. I am also  can recommend the patient continue to monitor for any signs of worsening infection if anything changes he should let me know soon as possible. 3. He is continuing as well with the IV antibiotics and dialysis this is vancomycin and ceftazidime. He is tolerating those without complication. Previously with the cefepime he was having some nausea and diarrhea he tells me. I am not certain that this was not just due to the fact he might of had a mild stomach virus as he has had that in the past but nonetheless the change in medication has definitely improve things for him. Number form also can recommend at this time that we had the patient go for an x-ray I did get a call from Dr. Earleen Newport at [facility] vascular. There can be doing a repeat angiogram looking at his blood flow but in the meantime they  do not have any ability for x-ray in their office for some reason I was thinking he was seeing podiatry/orthopedics not just vascular but he is not. That is okay we can definitely put in that order and we will see what the x-ray shows. We will see patient back for reevaluation in 1 week here in the clinic. If anything worsens or changes patient will contact our office for additional recommendations. Electronic Signature(s) Signed: 01/24/2022 2:10:15 PM By: Worthy Keeler PA-C Entered By: Worthy Keeler on 01/24/2022 14:10:15 Rames, Mavrick L. ([id number]) -------------------------------------------------------------------------------- SuperBill Details Patient Name: Neely, Aric L. Date of Service: 01/24/2022 Medical Record Number: [id number] Patient Account Number: [id number] Date of Birth/Sex: [birth year] (56 y.o. M) Treating RN: [name] Primary Care Provider: [name] Other Clinician: Referring Provider: [name] Treating Provider/Extender: [name] in Treatment: 12 Diagnosis Coding ICD-10 Codes Code Description E11.621 Type 2 diabetes mellitus with foot ulcer L97.524 Non-pressure  chronic ulcer of other part of left foot with necrosis of bone M86.672 Other chronic osteomyelitis, left ankle and foot I10 Essential (primary) hypertension I25.10 Atherosclerotic heart disease of native coronary artery without angina pectoris I50.42 Chronic combined systolic (congestive) and diastolic (congestive) heart failure R26.89 Other abnormalities of gait and mobility Facility Procedures CPT4 Code: [id number] Description: 33545 - DEB SUBQ TISSUE 20 SQ CM/< Modifier: Quantity: 1 CPT4 Code: Description: ICD-10 Diagnosis Description L97.524 Non-pressure chronic ulcer of other part of left foot with necrosis of b Modifier: one Quantity: Physician Procedures CPT4 Code: 6256389 Description: 99214 - WC PHYS LEVEL 4 - EST PT Modifier: 25 Quantity: 1 CPT4 Code: Description: ICD-10 Diagnosis Description E11.621 Type 2 diabetes mellitus with foot ulcer L97.524 Non-pressure chronic ulcer of other part of left foot with necrosis of b M86.672 Other chronic osteomyelitis, left ankle and foot I10 Essential (primary)  hypertension Modifier: one Quantity: CPT4 Code: [id number] Description: 11042 - WC PHYS SUBQ TISS 20 SQ CM Modifier: Quantity: 1 CPT4 Code: Description: ICD-10 Diagnosis Description L97.524 Non-pressure chronic ulcer of other part of left foot with necrosis of b Modifier: one Quantity: Electronic Signature(s) Signed: 01/24/2022 4:36:04 PM By: Worthy Keeler PA-C Entered By: Worthy Keeler on 01/24/2022 16:36:04

## 2022-01-27 ENCOUNTER — Other Ambulatory Visit: Payer: Self-pay | Admitting: Physician Assistant

## 2022-01-27 ENCOUNTER — Encounter: Payer: Medicare Other | Admitting: Physician Assistant

## 2022-01-27 ENCOUNTER — Ambulatory Visit
Admission: RE | Admit: 2022-01-27 | Discharge: 2022-01-27 | Disposition: A | Discharge status: Home / Self Care | Payer: Medicare Other | Source: Ambulatory Visit | Attending: Physician Assistant | Admitting: Physician Assistant

## 2022-01-27 DIAGNOSIS — I5042 Chronic combined systolic (congestive) and diastolic (congestive) heart failure: Secondary | ICD-10-CM | POA: Diagnosis not present

## 2022-01-27 DIAGNOSIS — M869 Osteomyelitis, unspecified: Secondary | ICD-10-CM | POA: Insufficient documentation

## 2022-01-27 DIAGNOSIS — Z5181 Encounter for therapeutic drug level monitoring: Secondary | ICD-10-CM | POA: Diagnosis not present

## 2022-01-27 DIAGNOSIS — E11621 Type 2 diabetes mellitus with foot ulcer: Secondary | ICD-10-CM | POA: Diagnosis not present

## 2022-01-27 DIAGNOSIS — E1129 Type 2 diabetes mellitus with other diabetic kidney complication: Secondary | ICD-10-CM | POA: Diagnosis not present

## 2022-01-27 DIAGNOSIS — I2510 Atherosclerotic heart disease of native coronary artery without angina pectoris: Secondary | ICD-10-CM | POA: Diagnosis not present

## 2022-01-27 DIAGNOSIS — D631 Anemia in chronic kidney disease: Secondary | ICD-10-CM | POA: Diagnosis not present

## 2022-01-27 DIAGNOSIS — L97524 Non-pressure chronic ulcer of other part of left foot with necrosis of bone: Secondary | ICD-10-CM | POA: Diagnosis not present

## 2022-01-27 DIAGNOSIS — N2581 Secondary hyperparathyroidism of renal origin: Secondary | ICD-10-CM | POA: Diagnosis not present

## 2022-01-27 DIAGNOSIS — I96 Gangrene, not elsewhere classified: Secondary | ICD-10-CM | POA: Diagnosis not present

## 2022-01-27 DIAGNOSIS — Z992 Dependence on renal dialysis: Secondary | ICD-10-CM | POA: Diagnosis not present

## 2022-01-27 DIAGNOSIS — S98912A Complete traumatic amputation of left foot, level unspecified, initial encounter: Secondary | ICD-10-CM | POA: Diagnosis not present

## 2022-01-27 DIAGNOSIS — N186 End stage renal disease: Secondary | ICD-10-CM | POA: Diagnosis not present

## 2022-01-27 DIAGNOSIS — I10 Essential (primary) hypertension: Secondary | ICD-10-CM | POA: Diagnosis not present

## 2022-01-27 DIAGNOSIS — L02612 Cutaneous abscess of left foot: Secondary | ICD-10-CM | POA: Diagnosis not present

## 2022-01-27 DIAGNOSIS — M86672 Other chronic osteomyelitis, left ankle and foot: Secondary | ICD-10-CM | POA: Diagnosis not present

## 2022-01-27 LAB — GLUCOSE, CAPILLARY
Glucose-Capillary: 228 mg/dL — ABNORMAL HIGH (ref 70–99)
Glucose-Capillary: 218 mg/dL — ABNORMAL HIGH (ref 70–99)

## 2022-01-27 NOTE — Progress Notes (Signed)
[place], [place] ([id number]) ?Visit Report for 01/27/2022 ?HBO Details ?Patient Name: [name], [name] ?Date of Service: 01/27/2022 10:30 AM ?Medical Record Number: [id number] ?Patient Account Number: [id number] ?Date of Birth/Sex: 1966/06/28 (56 y.o. M) ?Treating RN: [name] ?Primary Care Rodriquez Thorner: [name] ?Other Clinician: Jacqulyn Bath ?Referring Devell Parkerson: [name] ?Treating Lameka Disla/Extender: Jeri Cos ?Weeks in Treatment: 12 ?HBO Treatment Course Details ?Treatment Course Number: 1 ?Ordering Kanon Novosel: Jeri Cos ?Total Treatments Ordered: 60 ?HBO Treatment Start Date: 11/04/2021 ?HBO Indication: ?Chronic Refractory Osteomyelitis to Left Foot ?HBO Treatment Details ?Treatment Number: 12 ?Patient Type: Outpatient ?Chamber Type: Monoplace ?Chamber Serial #: [id number] ?Treatment Protocol: 2.0 ATA with 90 minutes oxygen, and no air breaks ?Treatment Details ?Compression Rate Down: 1.0 psi / minute ?De-Compression Rate Up: 1.5 psi / minute ?Compress Tx Pressure Air breaks and breathing periods Decompress Decompress ?Begins Reached (leave unused spaces blank) Begins Ends ?Chamber Pressure (ATA) 1 2 - - - - - - 2 1 ?Clock Time (24 hr) 10:38 10:55 - - - - - - 12:25 12:35 ?Treatment Length: 117 (minutes) ?Treatment Segments: 4 ?Vital Signs ?Capillary Blood Glucose Reference Range: 80 - 120 mg / dl ?HBO Diabetic Blood Glucose Intervention Range: <131 mg/dl or >249 mg/dl ?Time Vitals Blood Respiratory Capillary Blood Glucose Pulse Action ?Type: ?Pulse: Temperature: ?Taken: ?Pressure: ?Rate: ?Glucose (mg/dl): ?Meter #: Oximetry (%) Taken: ?Pre 10:23 128/74 72 16 98.2 228 1 none per protocol ?Post 12:39 128/72 66 16 98.7 ?Treatment Response ?Treatment Toleration: Well ?Treatment Completion ?Treatment Completed without Adverse Event ?Status: ?Electronic Signature(s) ?Signed: 01/27/2022 1:31:25 PM By: Enedina Finner RCP, RRT, CHT ?Signed: 01/27/2022 4:48:03 PM By: Worthy Keeler PA-C ?Entered By: Enedina Finner on 01/27/2022 12:56:42 ?[place], [place] ([id number]) ?-------------------------------------------------------------------------------- ?HBO Safety Checklist Details ?Patient Name: [name], [name] ?Date of Service: 01/27/2022 10:30 AM ?Medical Record Number: [id number] ?Patient Account Number: [id number] ?Date of Birth/Sex: 1966/01/16 (56 y.o. M) ?Treating RN: [name] ?Primary Care Mia Milan: [name] ?Other Clinician: Jacqulyn Bath ?Referring Donal Lynam: [name] ?Treating Ayomide Zuleta/Extender: Jeri Cos ?Weeks in Treatment: 12 ?HBO Safety Checklist Items ?Safety Checklist ?Consent Form Signed ?Patient voided / foley secured and emptied ?When did you last eato 10:00 am ?Last dose of injectable or oral agent n/a ?Ostomy pouch emptied and vented if applicable ?NA ?All implantable devices assessed, documented and approved ?NA ?Intravenous access site secured and place ?NA ?Valuables secured ?Linens and cotton and cotton/polyester blend (less than 51% polyester) ?Personal oil-based products / skin lotions / body lotions removed ?Wigs or hairpieces removed ?NA ?Smoking or tobacco materials removed ?NA ?Books / newspapers / magazines / loose paper removed ?NA ?Cologne, aftershave, perfume and deodorant removed ?Jewelry removed (may wrap wedding band) ?Make-up removed ?NA ?Hair care products removed ?Battery operated devices (external) removed ?NA ?Heating patches and chemical warmers removed ?NA ?Titanium eyewear removed ?NA ?Nail polish cured greater than 10 hours ?NA ?Casting material cured greater than 10 hours ?NA ?Hearing aids removed ?NA ?Loose dentures or partials removed ?NA ?Prosthetics have been removed ?NA ?Patient demonstrates correct use of air break device (if applicable) ?Patient concerns have been addressed ?Patient grounding bracelet on and cord attached to chamber ?Specifics for Inpatients (complete in addition to ?above) ?Medication sheet sent with patient ?Intravenous  medications needed or due during therapy sent with patient ?Drainage tubes (e.g. nasogastric tube or chest tube secured and vented) ?Endotracheal or Tracheotomy tube secured ?Cuff deflated of air and inflated with saline ?Airway suctioned ?Electronic Signature(s) ?Signed: 01/27/2022 1:31:25 PM By: Stark Jock,  Sallie RCP, RRT, CHT ?Entered By: Enedina Finner on 01/27/2022 11:01:19 ?

## 2022-01-27 NOTE — Progress Notes (Signed)
[place], [place] ([id number]) ?Visit Report for 01/27/2022 ?Arrival Information Details ?Patient Name: [name], [name] ?Date of Service: 01/27/2022 10:30 AM ?Medical Record Number: [id number] ?Patient Account Number: [id number] ?Date of Birth/Sex: 1966-08-13 (56 y.o. M) ?Treating RN: [name] ?Primary Care Emmons Toth: [name] ?Other Clinician: Jacqulyn Bath ?Referring Shabana Armentrout: [name] ?Treating Madalynn Pickelsimer/Extender: Jeri Cos ?Weeks in Treatment: 12 ?Visit Information History Since Last Visit ?Added or deleted any medications: No ?Patient Arrived: Ambulatory ?Any new allergies or adverse reactions: No ?Arrival Time: 10:20 ?Had a fall or experienced change in No ?Accompanied By: self ?activities of daily living that may affect ?Transfer Assistance: None ?risk of falls: ?Patient Identification Verified: Yes ?Signs or symptoms of abuse/neglect since last visito No ?Secondary Verification Process Completed: Yes ?Hospitalized since last visit: No ?Patient Requires Transmission-Based No ?Implantable device outside of the clinic excluding No ?Precautions: ?cellular tissue based products placed in the center ?Patient Has Alerts: Yes ?since last visit: ?Patient Alerts: Patient on Blood ?Has Dressing in Place as Prescribed: Yes ?Thinner ?Has Footwear/Offloading in Place as Prescribed: Yes ?DIALYSIS RIGHT ARM ?Left: Other:Defender Boot ?Aspirin 81/Brilinta ?Pain Present Now: No ?Electronic Signature(s) ?Signed: 01/27/2022 1:31:25 PM By: Enedina Finner RCP, RRT, CHT ?Entered By: Enedina Finner on 01/27/2022 10:59:43 ?[place], [place] ([id number]) ?-------------------------------------------------------------------------------- ?Encounter Discharge Information Details ?Patient Name: [name], [name] ?Date of Service: 01/27/2022 10:30 AM ?Medical Record Number: [id number] ?Patient Account Number: [id number] ?Date of Birth/Sex: [birth year] (56 y.o. M) ?Treating RN: [name] ?Primary Care Gurfateh Mcclain: [name] ?Other Clinician: Jacqulyn Bath ?Referring Jakaden Ouzts: [name] ?Treating Marien Manship/Extender: Jeri Cos ?Weeks in Treatment: 12 ?Encounter Discharge Information Items ?Discharge Condition: Stable ?Ambulatory Status: Ambulatory ?Discharge Destination: Home ?Transportation: Private Auto ?Accompanied By: self ?Schedule Follow-up Appointment: Yes ?Clinical Summary of Care: ?Notes ?Patient has an HBO treatment scheduled on 01/28/22 at 08:00 am. ?Electronic Signature(s) ?Signed: 01/27/2022 1:31:25 PM By: Enedina Finner RCP, RRT, CHT ?Entered By: Enedina Finner on 01/27/2022 12:58:40 ?[place], [place] ([id number]) ?-------------------------------------------------------------------------------- ?Vitals Details ?Patient Name: [name], [name] ?Date of Service: 01/27/2022 10:30 AM ?Medical Record Number: [id number] ?Patient Account Number: [id number] ?Date of Birth/Sex: [birth year] (56 y.o. M) ?Treating RN: [name] ?Primary Care Shemeca Lukasik: [name] ?Other Clinician: Jacqulyn Bath ?Referring Javier Mamone: [name] ?Treating Gunther Zawadzki/Extender: Jeri Cos ?Weeks in Treatment: 12 ?Vital Signs ?Time Taken: 10:23 ?Temperature (??F): 98.2 ?Height (in): 70 ?Pulse (bpm): 72 ?Weight (lbs): 180.4 ?Respiratory Rate (breaths/min): 16 ?Body Mass Index (BMI): 25.9 ?Blood Pressure (mmHg): 128/74 ?Capillary Blood Glucose (mg/dl): 228 ?Reference Range: 80 - 120 mg / dl ?Electronic Signature(s) ?Signed: 01/27/2022 1:31:25 PM By: Enedina Finner RCP, RRT, CHT ?Entered By: Enedina Finner on 01/27/2022 11:00:16 ?

## 2022-01-28 ENCOUNTER — Encounter: Payer: Medicare Other | Admitting: Physician Assistant

## 2022-01-28 DIAGNOSIS — M86672 Other chronic osteomyelitis, left ankle and foot: Secondary | ICD-10-CM | POA: Diagnosis not present

## 2022-01-28 DIAGNOSIS — E1122 Type 2 diabetes mellitus with diabetic chronic kidney disease: Secondary | ICD-10-CM | POA: Diagnosis not present

## 2022-01-28 DIAGNOSIS — I2510 Atherosclerotic heart disease of native coronary artery without angina pectoris: Secondary | ICD-10-CM | POA: Diagnosis not present

## 2022-01-28 DIAGNOSIS — E1151 Type 2 diabetes mellitus with diabetic peripheral angiopathy without gangrene: Secondary | ICD-10-CM | POA: Diagnosis not present

## 2022-01-28 DIAGNOSIS — I5042 Chronic combined systolic (congestive) and diastolic (congestive) heart failure: Secondary | ICD-10-CM | POA: Diagnosis not present

## 2022-01-28 DIAGNOSIS — I120 Hypertensive chronic kidney disease with stage 5 chronic kidney disease or end stage renal disease: Secondary | ICD-10-CM | POA: Diagnosis not present

## 2022-01-28 DIAGNOSIS — D631 Anemia in chronic kidney disease: Secondary | ICD-10-CM | POA: Diagnosis not present

## 2022-01-28 DIAGNOSIS — N186 End stage renal disease: Secondary | ICD-10-CM | POA: Diagnosis not present

## 2022-01-28 DIAGNOSIS — I10 Essential (primary) hypertension: Secondary | ICD-10-CM | POA: Diagnosis not present

## 2022-01-28 DIAGNOSIS — L97524 Non-pressure chronic ulcer of other part of left foot with necrosis of bone: Secondary | ICD-10-CM | POA: Diagnosis not present

## 2022-01-28 DIAGNOSIS — Z4781 Encounter for orthopedic aftercare following surgical amputation: Secondary | ICD-10-CM | POA: Diagnosis not present

## 2022-01-28 DIAGNOSIS — E11621 Type 2 diabetes mellitus with foot ulcer: Secondary | ICD-10-CM | POA: Diagnosis not present

## 2022-01-28 LAB — GLUCOSE, CAPILLARY
Glucose-Capillary: 160 mg/dL — ABNORMAL HIGH (ref 70–99)
Glucose-Capillary: 156 mg/dL — ABNORMAL HIGH (ref 70–99)

## 2022-01-28 NOTE — Progress Notes (Signed)
[place], [place] ([id number]) ?Visit Report for 01/28/2022 ?Arrival Information Details ?Patient Name: [name], [name] ?Date of Service: 01/28/2022 8:00 AM ?Medical Record Number: [id number] ?Patient Account Number: [id number] ?Date of Birth/Sex: [birth year] (56 y.o. M) ?Treating RN: [name] ?Primary Care Ulyana Pitones: [name] ?Other Clinician: Jacqulyn Bath ?Referring Elleigh Cassetta: [name] ?Treating Raena Pau/Extender: Jeri Cos ?Weeks in Treatment: 12 ?Visit Information History Since Last Visit ?Added or deleted any medications: No ?Patient Arrived: Ambulatory ?Any new allergies or adverse reactions: No ?Arrival Time: 08:00 ?Had a fall or experienced change in No ?Accompanied By: self ?activities of daily living that may affect ?Transfer Assistance: None ?risk of falls: ?Patient Identification Verified: Yes ?Signs or symptoms of abuse/neglect since last visito No ?Secondary Verification Process Completed: Yes ?Hospitalized since last visit: No ?Patient Requires Transmission-Based No ?Implantable device outside of the clinic excluding No ?Precautions: ?cellular tissue based products placed in the center ?Patient Has Alerts: Yes ?since last visit: ?Patient Alerts: Patient on Blood ?Has Dressing in Place as Prescribed: Yes ?Thinner ?Has Footwear/Offloading in Place as Prescribed: Yes ?DIALYSIS RIGHT ARM ?Left: Other:Defender Boot ?Aspirin 81/Brilinta ?Pain Present Now: No ?Electronic Signature(s) ?Signed: 01/28/2022 11:09:42 AM By: Enedina Finner RCP, RRT, CHT ?Entered By: Enedina Finner on 01/28/2022 08:26:51 ?[place], [place] ([id number]) ?-------------------------------------------------------------------------------- ?Encounter Discharge Information Details ?Patient Name: [name], [name] ?Date of Service: 01/28/2022 8:00 AM ?Medical Record Number: [id number] ?Patient Account Number: [id number] ?Date of Birth/Sex: 1965/12/24 (56 y.o. M) ?Treating RN: [name] ?Primary Care Aadi Bordner: [name] ?Other Clinician: Jacqulyn Bath ?Referring Maya Scholer: [name] ?Treating Naraly Fritcher/Extender: Jeri Cos ?Weeks in Treatment: 12 ?Encounter Discharge Information Items ?Discharge Condition: Stable ?Ambulatory Status: Ambulatory ?Discharge Destination: Home ?Transportation: Private Auto ?Accompanied By: self ?Schedule Follow-up Appointment: Yes ?Clinical Summary of Care: ?Notes ?Patient has an HBO treatment scheduled on 01/29/22 at 10:30 am. ?Electronic Signature(s) ?Signed: 01/28/2022 11:09:42 AM By: Enedina Finner RCP, RRT, CHT ?Entered By: Enedina Finner on 01/28/2022 11:08:57 ?[place], [place] ([id number]) ?-------------------------------------------------------------------------------- ?Vitals Details ?Patient Name: [name], [name] ?Date of Service: 01/28/2022 8:00 AM ?Medical Record Number: [id number] ?Patient Account Number: [id number] ?Date of Birth/Sex: 08/08/1966 (56 y.o. M) ?Treating RN: [name] ?Primary Care Adren Dollins: [name] ?Other Clinician: Jacqulyn Bath ?Referring Manpreet Kemmer: [name] ?Treating Vaani Morren/Extender: Jeri Cos ?Weeks in Treatment: 12 ?Vital Signs ?Time Taken: 08:11 ?Temperature (??F): 98.0 ?Height (in): 70 ?Pulse (bpm): 72 ?Weight (lbs): 180.4 ?Respiratory Rate (breaths/min): 16 ?Body Mass Index (BMI): 25.9 ?Blood Pressure (mmHg): 126/72 ?Capillary Blood Glucose (mg/dl): 160 ?Reference Range: 80 - 120 mg / dl ?Electronic Signature(s) ?Signed: 01/28/2022 11:09:42 AM By: Enedina Finner RCP, RRT, CHT ?Entered By: Enedina Finner on 01/28/2022 08:27:35 ?

## 2022-01-28 NOTE — Progress Notes (Signed)
[name], [name] ([id number]) ?Visit Report for 01/28/2022 ?HBO Details ?Patient Name: [name], [name] ?Date of Service: 01/28/2022 8:00 AM ?Medical Record Number: [id number] ?Patient Account Number: [id number] ?Date of Birth/Sex: September 22, 1966 (56 y.o. M) ?Treating RN: [name] ?Primary Care Aleesha Ringstad: [name] ?Other Clinician: Jacqulyn Bath ?Referring Reeve Turnley: [name] ?Treating Olivea Sonnen/Extender: Jeri Cos ?Weeks in Treatment: 12 ?HBO Treatment Course Details ?Treatment Course Number: 1 ?Ordering Alizae Bechtel: Jeri Cos ?Total Treatments Ordered: 60 ?HBO Treatment Start Date: 11/04/2021 ?HBO Indication: ?Chronic Refractory Osteomyelitis to Left Foot ?HBO Treatment Details ?Treatment Number: 101 ?Patient Type: Outpatient ?Chamber Type: Monoplace ?Chamber Serial #: [id number] ?Treatment Protocol: 2.0 ATA with 90 minutes oxygen, and no air breaks ?Treatment Details ?Compression Rate Down: 1.0 psi / minute ?De-Compression Rate Up: 1.0 psi / minute ?Compress Tx Pressure Air breaks and breathing periods Decompress Decompress ?Begins Reached (leave unused spaces blank) Begins Ends ?Chamber Pressure (ATA) 1 2 - - - - - - 2 1 ?Clock Time (24 hr) 08:21 08:38 - - - - - - 10:08 10:23 ?Treatment Length: 122 (minutes) ?Treatment Segments: 4 ?Vital Signs ?Capillary Blood Glucose Reference Range: 80 - 120 mg / dl ?HBO Diabetic Blood Glucose Intervention Range: <131 mg/dl or >249 mg/dl ?Time Vitals Blood Respiratory Capillary Blood Glucose Pulse Action ?Type: ?Pulse: Temperature: ?Taken: ?Pressure: ?Rate: ?Glucose (mg/dl): ?Meter #: Oximetry (%) Taken: ?Pre 08:11 126/72 72 16 98 160 1 none per protocol ?Post 10:27 126/78 66 16 98 156 1 none per protocol ?Treatment Response ?Treatment Toleration: Well ?Treatment Completion ?Treatment Completed without Adverse Event ?Status: ?Electronic Signature(s) ?Signed: 01/28/2022 11:09:42 AM By: Enedina Finner RCP, RRT, CHT ?Signed: 01/28/2022 5:16:21 PM By: Worthy Keeler  PA-C ?Entered By: Enedina Finner on 01/28/2022 11:07:40 ?[place], [place] ([id number]) ?-------------------------------------------------------------------------------- ?HBO Safety Checklist Details ?Patient Name: [name], [name] ?Date of Service: 01/28/2022 8:00 AM ?Medical Record Number: [id number] ?Patient Account Number: [id number] ?Date of Birth/Sex: 1966/02/04 (56 y.o. M) ?Treating RN: [name] ?Primary Care Obrian Bulson: [name] ?Other Clinician: Jacqulyn Bath ?Referring Favio Moder: [name] ?Treating Xitlaly Ault/Extender: Jeri Cos ?Weeks in Treatment: 12 ?HBO Safety Checklist Items ?Safety Checklist ?Consent Form Signed ?Patient voided / foley secured and emptied ?When did you last eato 07:00 am ?Last dose of injectable or oral agent n/a ?Ostomy pouch emptied and vented if applicable ?NA ?All implantable devices assessed, documented and approved ?NA ?Intravenous access site secured and place ?NA ?Valuables secured ?Linens and cotton and cotton/polyester blend (less than 51% polyester) ?Personal oil-based products / skin lotions / body lotions removed ?Wigs or hairpieces removed ?NA ?Smoking or tobacco materials removed ?NA ?Books / newspapers / magazines / loose paper removed ?NA ?Cologne, aftershave, perfume and deodorant removed ?Jewelry removed (may wrap wedding band) ?Make-up removed ?NA ?Hair care products removed ?Battery operated devices (external) removed ?NA ?Heating patches and chemical warmers removed ?NA ?Titanium eyewear removed ?NA ?Nail polish cured greater than 10 hours ?NA ?Casting material cured greater than 10 hours ?NA ?Hearing aids removed ?NA ?Loose dentures or partials removed ?NA ?Prosthetics have been removed ?NA ?Patient demonstrates correct use of air break device (if applicable) ?Patient concerns have been addressed ?Patient grounding bracelet on and cord attached to chamber ?Specifics for Inpatients (complete in addition to ?above) ?Medication sheet sent with  patient ?Intravenous medications needed or due during therapy sent with patient ?Drainage tubes (e.g. nasogastric tube or chest tube secured and vented) ?Endotracheal or Tracheotomy tube secured ?Cuff deflated of air and inflated with saline ?Airway suctioned ?Electronic Signature(s) ?Signed: 01/28/2022  11:09:42 AM By: Enedina Finner RCP, RRT, CHT ?Entered By: Enedina Finner on 01/28/2022 08:28:39 ?

## 2022-01-28 NOTE — Progress Notes (Signed)
Brake, Jennings ([id number]) ?Visit Report for 01/24/2022 ?Arrival Information Details ?Patient Name: [name], [name] ?Date of Service: 01/24/2022 1:15 PM ?Medical Record Number: [id number] ?Patient Account Number: [id number] ?Date of Birth/Sex: November 26, 1965 (56 y.o. M) ?Treating RN: [name] ?Primary Care Karma Hiney: [name] Other Clinician: ?Referring Makayleigh Poliquin: [name] ?Treating Contessa Preuss/Extender: Jeri Cos ?Weeks in Treatment: 12 ?Visit Information History Since Last Visit ?All ordered tests and consults were completed: No ?Patient Arrived: Ambulatory ?Added or deleted any medications: No ?Arrival Time: 13:18 ?Any new allergies or adverse reactions: No ?Accompanied By: self ?Had a fall or experienced change in No ?Transfer Assistance: None ?activities of daily living that may affect ?Patient Identification Verified: Yes ?risk of falls: ?Secondary Verification Process Completed: Yes ?Signs or symptoms of abuse/neglect since last visito No ?Patient Requires Transmission-Based No ?Hospitalized since last visit: No ?Precautions: ?Implantable device outside of the clinic excluding No ?Patient Has Alerts: Yes ?cellular tissue based products placed in the center ?Patient Alerts: Patient on Blood ?since last visit: ?Thinner ?Has Dressing in Place as Prescribed: Yes ?DIALYSIS RIGHT ARM ?Pain Present Now: No ?Aspirin 81/Brilinta ?Electronic Signature(s) ?Signed: 01/28/2022 8:57:31 AM By: [name] RN ?Entered By: [name] on 01/24/2022 13:21:18 ?Kush, [place] ([id number]) ?-------------------------------------------------------------------------------- ?Clinic Level of Care Assessment Details ?Patient Name: [name], [name] ?Date of Service: 01/24/2022 1:15 PM ?Medical Record Number: [id number] ?Patient Account Number: [id number] ?Date of Birth/Sex: 1966-08-12 (56 y.o. M) ?Treating RN: [name] ?Primary Care Kaio Kuhlman: [name] Other Clinician: ?Referring Deztinee Lohmeyer: [name] ?Treating  Casidee Jann/Extender: Jeri Cos ?Weeks in Treatment: 12 ?Clinic Level of Care Assessment Items ?TOOL 1 Quantity Score ?[]  - Use when EandM and Procedure is performed on INITIAL visit 0 ?ASSESSMENTS - Nursing Assessment / Reassessment ?[]  - General Physical Exam (combine w/ comprehensive assessment (listed just below) when performed on new ?0 ?pt. evals) ?[]  - 0 ?Comprehensive Assessment (HX, ROS, Risk Assessments, Wounds Hx, etc.) ?ASSESSMENTS - Wound and Skin Assessment / Reassessment ?[]  - Dermatologic / Skin Assessment (not related to wound area) 0 ?ASSESSMENTS - Ostomy and/or Continence Assessment and Care ?[]  - Incontinence Assessment and Management 0 ?[]  - 0 ?Ostomy Care Assessment and Management (repouching, etc.) ?PROCESS - Coordination of Care ?[]  - Simple Patient / Family Education for ongoing care 0 ?[]  - 0 ?Complex (extensive) Patient / Family Education for ongoing care ?[]  - 0 ?Staff obtains Consents, Records, Test Results / Process Orders ?[]  - 0 ?Staff telephones HHA, Nursing Homes / Clarify orders / etc ?[]  - 0 ?Routine Transfer to another Facility (non-emergent condition) ?[]  - 0 ?Routine Hospital Admission (non-emergent condition) ?[]  - 0 ?New Admissions / Biomedical engineer / Ordering NPWT, Apligraf, etc. ?[]  - 0 ?Emergency Hospital Admission (emergent condition) ?PROCESS - Special Needs ?[]  - Pediatric / Minor Patient Management 0 ?[]  - 0 ?Isolation Patient Management ?[]  - 0 ?Hearing / Language / Visual special needs ?[]  - 0 ?Assessment of Community assistance (transportation, D/C planning, etc.) ?[]  - 0 ?Additional assistance / Altered mentation ?[]  - 0 ?Support Surface(s) Assessment (bed, cushion, seat, etc.) ?INTERVENTIONS - Miscellaneous ?[]  - External ear exam 0 ?[]  - 0 ?Patient Transfer (multiple staff / Civil Service fast streamer / Similar devices) ?[]  - 0 ?Simple Staple / Suture removal (25 or less) ?[]  - 0 ?Complex Staple / Suture removal (26 or more) ?[]  - 0 ?Hypo/Hyperglycemic Management (do not  check if billed separately) ?[]  - 0 ?Ankle / Brachial Index (ABI) - do not check if billed separately ?Has the patient been seen at the  hospital within the last three years: Yes ?Total Score: 0 ?Level Of Care: ____ ?Vog[place]ang, [place] ([id number]) ?Electronic Signature(s) ?Signed: 01/28/2022 8:57:31 AM By: [name] RN ?Entered By: [name] on 01/24/2022 13:50:11 ?Sabour[place], [place] ([id number]) ?-------------------------------------------------------------------------------- ?Encounter Discharge Information Details ?Patient Name: [name], [name] ?Date of Service: 01/24/2022 1:15 PM ?Medical Record Number: [id number] ?Patient Account Number: [id number] ?Date of Birth/Sex: 05/22/1966 (56 y.o. M) ?Treating RN: [name] ?Primary Care Billal Rollo: [name] Other Clinician: ?Referring Shaniyah Wix: [name] ?Treating Leeana Creer/Extender: Jeri Cos ?Weeks in Treatment: 12 ?Encounter Discharge Information Items Post Procedure Vitals ?Discharge Condition: Stable ?Temperature (?F): 97.8 ?Ambulatory Status: Ambulatory ?Pulse (bpm): 72 ?Discharge Destination: Home ?Respiratory Rate (breaths/min): 16 ?Transportation: Private Auto ?Blood Pressure (mmHg): 122/78 ?Accompanied By: self ?Schedule Follow-up Appointment: Yes ?Clinical Summary of Care: Patient Declined ?Electronic Signature(s) ?Signed: 01/28/2022 8:57:31 AM By: [name] RN ?Entered By: [name] on 01/24/2022 13:51:59 ?[name], [place] ([id number]) ?-------------------------------------------------------------------------------- ?Lower Extremity Assessment Details ?Patient Name: [name], [name] ?Date of Service: 01/24/2022 1:15 PM ?Medical Record Number: [id number] ?Patient Account Number: [id number] ?Date of Birth/Sex: 10/28/1965 (56 y.o. M) ?Treating RN: [name] ?Primary Care Brysen Shankman: [name] Other Clinician: ?Referring Amarian Botero: [name] ?Treating Marthe Dant/Extender: Jeri Cos ?Weeks in Treatment: 12 ?Electronic Signature(s) ?Signed: 01/28/2022  8:57:31 AM By: [name] RN ?Entered By: [name] on 01/24/2022 13:32:57 ?Milillo, [place] ([id number]) ?-------------------------------------------------------------------------------- ?Multi Wound Chart Details ?Patient Name: [name], [name] ?Date of Service: 01/24/2022 1:15 PM ?Medical Record Number: [id number] ?Patient Account Number: [id number] ?Date of Birth/Sex: [birth year] (56 y.o. 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 ?Comorbid History: Cataracts, Anemia, Sleep Apnea, Cataracts, Anemia, Sleep Apnea, N/A ?Arrhythmia, Congestive Heart Arrhythmia, Congestive Heart ?Failure, Coronary Artery Disease, Failure, Coronary Artery Disease, ?Hypertension, Myocardial Infarction, Hypertension, Myocardial Infarction, ?Type II Diabetes, Neuropathy, Type II Diabetes, Neuropathy, ?Seizure Disorder Seizure Disorder ?Date Acquired: 09/09/2021 10/28/2021 N/A ?Weeks of Treatment: 9 8 N/A ?Wound Status: Open Open N/A ?Wound Recurrence: No No N/A ?Measurements L x W x D (cm) 4x1.7x1.1 3x2x1.7 N/A ?Area (cm?) : 5.341 4.712 N/A ?Volume (cm?) : 5.875 8.011 N/A ?% Reduction in Area: 62.20% -50.00% N/A ?% Reduction in Volume: 85.70% -2451.30% N/A ?Classification: Full Thickness With Exposed Grade 2 N/A ?Support Structures ?Exudate Amount: Medium Medium N/A ?Exudate Type: Serous Serosanguineous N/A ?Exudate Color: amber red, brown N/A ?Granulation Amount: Medium (34-66%) None Present (0%)  N/A ?Granulation Quality: Red N/A N/A ?Necrotic Amount: Medium (34-66%) Large (67-100%) N/A ?Necrotic Tissue: Adherent LucanExposed Structures: ?Fascia: Yes ?Fascia: No N/A ?Fat Layer (Subcutaneous

## 2022-01-29 ENCOUNTER — Other Ambulatory Visit: Payer: Self-pay | Admitting: Physician Assistant

## 2022-01-29 ENCOUNTER — Encounter: Payer: Medicare Other | Admitting: Internal Medicine

## 2022-01-29 DIAGNOSIS — I10 Essential (primary) hypertension: Secondary | ICD-10-CM | POA: Diagnosis not present

## 2022-01-29 DIAGNOSIS — I5042 Chronic combined systolic (congestive) and diastolic (congestive) heart failure: Secondary | ICD-10-CM | POA: Diagnosis not present

## 2022-01-29 DIAGNOSIS — Z992 Dependence on renal dialysis: Secondary | ICD-10-CM | POA: Diagnosis not present

## 2022-01-29 DIAGNOSIS — L97524 Non-pressure chronic ulcer of other part of left foot with necrosis of bone: Secondary | ICD-10-CM

## 2022-01-29 DIAGNOSIS — N186 End stage renal disease: Secondary | ICD-10-CM | POA: Diagnosis not present

## 2022-01-29 DIAGNOSIS — N2581 Secondary hyperparathyroidism of renal origin: Secondary | ICD-10-CM | POA: Diagnosis not present

## 2022-01-29 DIAGNOSIS — M86672 Other chronic osteomyelitis, left ankle and foot: Secondary | ICD-10-CM | POA: Diagnosis not present

## 2022-01-29 DIAGNOSIS — I2510 Atherosclerotic heart disease of native coronary artery without angina pectoris: Secondary | ICD-10-CM | POA: Diagnosis not present

## 2022-01-29 DIAGNOSIS — I96 Gangrene, not elsewhere classified: Secondary | ICD-10-CM | POA: Diagnosis not present

## 2022-01-29 DIAGNOSIS — E11621 Type 2 diabetes mellitus with foot ulcer: Secondary | ICD-10-CM | POA: Diagnosis not present

## 2022-01-29 DIAGNOSIS — D631 Anemia in chronic kidney disease: Secondary | ICD-10-CM | POA: Diagnosis not present

## 2022-01-29 DIAGNOSIS — L02612 Cutaneous abscess of left foot: Secondary | ICD-10-CM | POA: Diagnosis not present

## 2022-01-29 LAB — GLUCOSE, CAPILLARY
Glucose-Capillary: 138 mg/dL — ABNORMAL HIGH (ref 70–99)
Glucose-Capillary: 120 mg/dL — ABNORMAL HIGH (ref 70–99)

## 2022-01-29 NOTE — Progress Notes (Signed)
Plourde, Ramos ([id number]) ?Visit Report for 01/29/2022 ?Arrival Information Details ?Patient Name: [name], [name] ?Date of Service: 01/29/2022 10:30 AM ?Medical Record Number: [id number] ?Patient Account Number: [id number] ?Date of Birth/Sex: [birth year] (56 y.o. M) ?Treating RN: [name] ?Primary Care Deniqua Perry: [name] ?Other Clinician: Jacqulyn Bath ?Referring Alsha Meland: [name] ?Treating Ashtian Villacis/Extender: Ricard Dillon ?Weeks in Treatment: 12 ?Visit Information History Since Last Visit ?Added or deleted any medications: No ?Patient Arrived: Ambulatory ?Any new allergies or adverse reactions: No ?Arrival Time: 09:50 ?Had a fall or experienced change in No ?Accompanied By: self ?activities of daily living that may affect ?Transfer Assistance: None ?risk of falls: ?Patient Identification Verified: Yes ?Signs or symptoms of abuse/neglect since last visito No ?Secondary Verification Process Completed: Yes ?Hospitalized since last visit: No ?Patient Requires Transmission-Based No ?Implantable device outside of the clinic excluding No ?Precautions: ?cellular tissue based products placed in the center ?Patient Has Alerts: Yes ?since last visit: ?Patient Alerts: Patient on Blood ?Has Dressing in Place as Prescribed: Yes ?Thinner ?Has Footwear/Offloading in Place as Prescribed: Yes ?DIALYSIS RIGHT ARM ?Left: Other:Defender Boot ?Aspirin 81/Brilinta ?Pain Present Now: No ?Electronic Signature(s) ?Signed: 01/29/2022 12:20:02 PM By: Enedina Finner RCP, RRT, CHT ?Entered By: Enedina Finner on 01/29/2022 10:31:28 ?Beals, [place] ([id number]) ?-------------------------------------------------------------------------------- ?Encounter Discharge Information Details ?Patient Name: [name], [name] ?Date of Service: 01/29/2022 10:30 AM ?Medical Record Number: [id number] ?Patient Account Number: [id number] ?Date of Birth/Sex: [birth year] (56 y.o. M) ?Treating RN: [name] ?Primary Care Zoanne Newill:  [name] ?Other Clinician: Jacqulyn Bath ?Referring Demetrica Zipp: [name] ?Treating Camey Edell/Extender: Ricard Dillon ?Weeks in Treatment: 12 ?Encounter Discharge Information Items ?Discharge Condition: Stable ?Ambulatory Status: Ambulatory ?Discharge Destination: Home ?Transportation: Private Auto ?Accompanied By: self ?Schedule Follow-up Appointment: Yes ?Clinical Summary of Care: ?Notes ?Patient has an HBO treatment scheduled on 01/30/22 at 08:00 am. ?Electronic Signature(s) ?Signed: 01/29/2022 12:20:02 PM By: Enedina Finner RCP, RRT, CHT ?Entered By: Enedina Finner on 01/29/2022 12:19:39 ?Miranda, [place] ([id number]) ?-------------------------------------------------------------------------------- ?Vitals Details ?Patient Name: [name], [name] ?Date of Service: 01/29/2022 10:30 AM ?Medical Record Number: [id number] ?Patient Account Number: [id number] ?Date of Birth/Sex: [birth year] (56 y.o. M) ?Treating RN: [name] ?Primary Care Shamera Yarberry: [name] ?Other Clinician: Jacqulyn Bath ?Referring Quinne Pires: [name] ?Treating Mitsuko Luera/Extender: Ricard Dillon ?Weeks in Treatment: 12 ?Vital Signs ?Time Taken: 09:53 ?Temperature (??F): 98.3 ?Height (in): 70 ?Pulse (bpm): 66 ?Weight (lbs): 180.4 ?Respiratory Rate (breaths/min): 16 ?Body Mass Index (BMI): 25.9 ?Blood Pressure (mmHg): 130/72 ?Capillary Blood Glucose (mg/dl): 138 ?Reference Range: 80 - 120 mg / dl ?Electronic Signature(s) ?Signed: 01/29/2022 12:20:02 PM By: Enedina Finner RCP, RRT, CHT ?Entered By: Enedina Finner on 01/29/2022 10:32:19 ?

## 2022-01-30 ENCOUNTER — Encounter: Payer: Medicare Other | Admitting: Physician Assistant

## 2022-01-30 DIAGNOSIS — E11621 Type 2 diabetes mellitus with foot ulcer: Secondary | ICD-10-CM | POA: Diagnosis not present

## 2022-01-30 DIAGNOSIS — I2510 Atherosclerotic heart disease of native coronary artery without angina pectoris: Secondary | ICD-10-CM | POA: Diagnosis not present

## 2022-01-30 DIAGNOSIS — I10 Essential (primary) hypertension: Secondary | ICD-10-CM | POA: Diagnosis not present

## 2022-01-30 DIAGNOSIS — M86672 Other chronic osteomyelitis, left ankle and foot: Secondary | ICD-10-CM | POA: Diagnosis not present

## 2022-01-30 DIAGNOSIS — L97524 Non-pressure chronic ulcer of other part of left foot with necrosis of bone: Secondary | ICD-10-CM | POA: Diagnosis not present

## 2022-01-30 DIAGNOSIS — I5042 Chronic combined systolic (congestive) and diastolic (congestive) heart failure: Secondary | ICD-10-CM | POA: Diagnosis not present

## 2022-01-30 LAB — GLUCOSE, CAPILLARY
Glucose-Capillary: 163 mg/dL — ABNORMAL HIGH (ref 70–99)
Glucose-Capillary: 145 mg/dL — ABNORMAL HIGH (ref 70–99)

## 2022-01-30 NOTE — Progress Notes (Signed)
Sima, [place] ([id number]) ?Visit Report for 01/30/2022 ?HBO Details ?Patient Name: [name], [name] ?Date of Service: 01/30/2022 8:00 AM ?Medical Record Number: [id number] ?Patient Account Number: [id number] ?Date of Birth/Sex: [birth year] (56 y.o. 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 ?HBO Treatment Details ?Treatment Number: 29 ?Patient Type: Outpatient ?Chamber Type: Monoplace ?Chamber Serial #: [id number] ?Treatment Protocol: 2.0 ATA with 90 minutes oxygen, and no air breaks ?Treatment Details ?Compression Rate Down: 1.0 psi / minute ?De-Compression Rate Up: 1.0 psi / minute ?Compress Tx Pressure Air breaks and breathing periods Decompress Decompress ?Begins Reached (leave unused spaces blank) Begins Ends ?Chamber Pressure (ATA) 1 2 - - - - - - 2 1 ?Clock Time (24 hr) 08:12 08:29 - - - - - - 09:59 10:14 ?Treatment Length: 122 (minutes) ?Treatment Segments: 4 ?Vital Signs ?Capillary Blood Glucose Reference Range: 80 - 120 mg / dl ?HBO Diabetic Blood Glucose Intervention Range: <131 mg/dl or >249 mg/dl ?Time Vitals Blood Respiratory Capillary Blood Glucose Pulse Action ?Type: ?Pulse: Temperature: ?Taken: ?Pressure: ?Rate: ?Glucose (mg/dl): ?Meter #: Oximetry (%) Taken: ?Pre 08:04 128/76 84 16 98 163 1 none per protocol ?Post 10:18 122/72 72 16 98.4 145 1 none per protocol ?Treatment Response ?Treatment Toleration: Well ?Treatment Completion ?Treatment Completed without Adverse Event ?Status: ?Electronic Signature(s) ?Signed: 01/30/2022 11:17:02 AM By: Enedina Finner RCP, RRT, CHT ?Signed: 01/30/2022 3:59:26 PM By: Worthy Keeler  PA-C ?Entered By: Enedina Finner on 01/30/2022 11:14:05 ?[name], [name] ([id number]) ?-------------------------------------------------------------------------------- ?HBO Safety Checklist Details ?Patient Name: [name], [name] ?Date of Service: 01/30/2022 8:00 AM ?Medical Record Number: [id number] ?Patient Account Number: [id number] ?Date of Birth/Sex: November 10, 1965 (56 y.o. M) ?Treating RN: [name] ?Primary Care Bhavik Cabiness: [name] ?Other Clinician: Jacqulyn Bath ?Referring Jeania Nater: [name] ?Treating Maki Hege/Extender: Jeri Cos ?Weeks in Treatment: 13 ?HBO Safety Checklist Items ?Safety Checklist ?Consent Form Signed ?Patient voided / foley secured and emptied ?When did you last eato 07:00 am ?Last dose of injectable or oral agent n/a ?Ostomy pouch emptied and vented if applicable ?NA ?All implantable devices assessed, documented and approved ?NA ?Intravenous access site secured and place ?NA ?Valuables secured ?Linens and cotton and cotton/polyester blend (less than 51% polyester) ?Personal oil-based products / skin lotions / body lotions removed ?Wigs or hairpieces removed ?NA ?Smoking or tobacco materials removed ?NA ?Books / newspapers / magazines / loose paper removed ?NA ?Cologne, aftershave, perfume and deodorant removed ?Jewelry removed (may wrap wedding band) ?Make-up removed ?NA ?Hair care products removed ?Battery operated devices (external) removed ?NA ?Heating patches and chemical warmers removed ?NA ?Titanium eyewear removed ?NA ?Nail polish cured greater than 10 hours ?NA ?Casting material cured greater than 10 hours ?NA ?Hearing aids removed ?NA ?Loose dentures or partials removed ?NA ?Prosthetics have been removed ?NA ?Patient demonstrates correct use of air break device (if applicable) ?Patient concerns have been addressed ?Patient grounding bracelet on and cord attached to chamber ?Specifics for Inpatients (complete in addition to ?above) ?Medication sheet sent with  patient ?Intravenous medications needed or due during therapy sent with patient ?Drainage tubes (e.g. nasogastric tube or chest tube secured and vented) ?Endotracheal or Tracheotomy tube secured ?Cuff deflated of air and inflated with saline ?Airway suctioned ?Electronic Signature(s) ?Signed: 01/30/2022  11:17:02 AM By: Enedina Finner RCP, RRT, CHT ?Entered By: Enedina Finner on 01/30/2022 09:30:15 ?

## 2022-01-30 NOTE — Progress Notes (Signed)
Bakken, [place] ([id number]) ?Visit Report for 01/29/2022 ?HBO Details ?Patient Name: [name], [name] ?Date of Service: 01/29/2022 10:30 AM ?Medical Record Number: [id number] ?Patient Account Number: [id number] ?Date of Birth/Sex: 1965/11/09 (56 y.o. 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 ?HBO Treatment Details ?Treatment Number: 55 ?Patient Type: Outpatient ?Chamber Type: Monoplace ?Chamber Serial #: [id number] ?Treatment Protocol: 2.0 ATA with 90 minutes oxygen, and no air breaks ?Treatment Details ?Compression Rate Down: 1.0 psi / minute ?De-Compression Rate Up: 1.5 psi / minute ?Compress Tx Pressure Air breaks and breathing periods Decompress Decompress ?Begins Reached (leave unused spaces blank) Begins Ends ?Chamber Pressure (ATA) 1 2 - - - - - - 2 1 ?Clock Time (24 hr) 10:05 10:22 - - - - - - 11:52 12:03 ?Treatment Length: 118 (minutes) ?Treatment Segments: 4 ?Vital Signs ?Capillary Blood Glucose Reference Range: 80 - 120 mg / dl ?HBO Diabetic Blood Glucose Intervention Range: <131 mg/dl or >249 mg/dl ?Time Vitals Blood Respiratory Capillary Blood Glucose Pulse Action ?Type: ?Pulse: Temperature: ?Taken: ?Pressure: ?Rate: ?Glucose (mg/dl): ?Meter #: Oximetry (%) Taken: ?Pre 09:53 130/72 66 16 98.3 138 1 none per protocol ?Post 12:08 122/74 72 16 98.1 120 1 none per protocol ?Treatment Response ?Treatment Toleration: Well ?Treatment Completion ?Treatment Completed without Adverse Event ?Status: ?[name] Notes ?no concerns with rx given ?HBO Attestation ?I certify that I supervised this HBO treatment in accordance with ?Medicare  guidelines. A trained emergency response team is readily Yes ?available per hospital policies and procedures. ?Continue HBOT as ordered. Yes ?Electronic Signature(s) ?Signed: 01/30/2022 7:47:50 AM By: Linton Ham MD ?Previous Signature: 01/29/2022 12:20:02 PM Version By: Enedina Finner RCP, RRT, CHT ?Entered By: Linton Ham on 01/29/2022 13:14:33 ?[place], [place] ([id number]) ?-------------------------------------------------------------------------------- ?HBO Safety Checklist Details ?Patient Name: [name], [name] ?Date of Service: 01/29/2022 10:30 AM ?Medical Record Number: [id number] ?Patient Account Number: [id number] ?Date of Birth/Sex: [birth year] (56 y.o. M) ?Treating RN: [name] ?Primary Care Doloros Kwolek: [name] ?Other Clinician: Jacqulyn Bath ?Referring Alekxander Isola: [name] ?Treating Cherisa Brucker/Extender: Ricard Dillon ?Weeks in Treatment: 12 ?HBO Safety Checklist Items ?Safety Checklist ?Consent Form Signed ?Patient voided / foley secured and emptied ?When did you last eato 09:30 am ?Last dose of injectable or oral agent n/a ?Ostomy pouch emptied and vented if applicable ?NA ?All implantable devices assessed, documented and approved ?NA ?Intravenous access site secured and place ?NA ?Valuables secured ?Linens and cotton and cotton/polyester blend (less than 51% polyester) ?Personal oil-based products / skin lotions / body lotions removed ?Wigs or hairpieces removed ?NA ?Smoking or tobacco materials removed ?NA ?Books / newspapers / magazines / loose paper removed ?NA ?Cologne, aftershave, perfume and deodorant removed ?Jewelry removed (may wrap wedding band) ?Make-up removed ?NA ?Hair care products removed ?Battery operated devices (external) removed ?NA ?Heating patches and chemical warmers removed ?NA ?Titanium eyewear removed ?NA ?Nail polish cured greater than 10 hours ?NA ?Casting material cured greater than 10 hours ?NA ?Hearing aids removed ?NA ?Loose dentures or partials  removed ?NA ?Prosthetics have been removed ?NA ?Patient demonstrates correct use of air break device (if applicable) ?Patient concerns have been addressed ?Patient grounding bracelet on and cord attached to chamber ?Specifics for Inpatients (complete in addition to ?above) ?Medication sheet  sent with patient ?Intravenous medications needed or due during therapy sent with patient ?Drainage tubes (e.g. nasogastric tube or chest tube secured and vented) ?Endotracheal or Tracheotomy tube secured ?Cuff deflated of air and inflated with saline ?Airway suctioned ?Electronic Signature(s) ?Signed: 01/29/2022 12:20:02 PM By: Enedina Finner RCP, RRT, CHT ?Entered By: Enedina Finner on 01/29/2022 10:33:23 ?

## 2022-01-30 NOTE — Progress Notes (Signed)
[name], [name] ([id number]) ?Visit Report for 01/30/2022 ?Arrival Information Details ?Patient Name: [name], [name] ?Date of Service: 01/30/2022 8:00 AM ?Medical Record Number: [id number] ?Patient Account Number: [id number] ?Date of Birth/Sex: April 28, 1966 (56 y.o. M) ?Treating RN: [name] ?Primary Care Ayona Yniguez: [name] ?Other Clinician: Jacqulyn Bath ?Referring Daiya Tamer: [name] ?Treating Elveria Lauderbaugh/Extender: Jeri Cos ?Weeks in Treatment: 13 ?Visit Information History Since Last Visit ?Added or deleted any medications: No ?Patient Arrived: Ambulatory ?Any new allergies or adverse reactions: No ?Arrival Time: 08:00 ?Had a fall or experienced change in No ?Accompanied By: self ?activities of daily living that may affect ?Transfer Assistance: None ?risk of falls: ?Patient Identification Verified: Yes ?Signs or symptoms of abuse/neglect since last visito No ?Secondary Verification Process Completed: Yes ?Hospitalized since last visit: No ?Patient Requires Transmission-Based No ?Implantable device outside of the clinic excluding No ?Precautions: ?cellular tissue based products placed in the center ?Patient Has Alerts: Yes ?since last visit: ?Patient Alerts: Patient on Blood ?Has Dressing in Place as Prescribed: Yes ?Thinner ?Has Footwear/Offloading in Place as Prescribed: Yes ?DIALYSIS RIGHT ARM ?Left: Other:Defender Boot ?Aspirin 81/Brilinta ?Pain Present Now: No ?Electronic Signature(s) ?Signed: 01/30/2022 11:17:02 AM By: Enedina Finner RCP, RRT, CHT ?Entered By: Enedina Finner on 01/30/2022 09:28:16 ?[name], Bridge [name] ([id number]) ?-------------------------------------------------------------------------------- ?Encounter Discharge Information Details ?Patient Name: [name], [name] ?Date of Service: 01/30/2022 8:00 AM ?Medical Record Number: [id number] ?Patient Account Number: [id number] ?Date of Birth/Sex: [birth year] (56 y.o. M) ?Treating RN: [name] ?Primary Care Mervin Ramires: [name] ?Other Clinician: Jacqulyn Bath ?Referring Alphus Zeck: [name] ?Treating Saige Busby/Extender: Jeri Cos ?Weeks in Treatment: 13 ?Encounter Discharge Information Items ?Discharge Condition: Stable ?Ambulatory Status: Ambulatory ?Discharge Destination: Home ?Transportation: Private Auto ?Accompanied By: self ?Schedule Follow-up Appointment: Yes ?Clinical Summary of Care: ?Notes ?Patient has an HBO treatment scheduled on 01/31/22 at 10:30 am. ?Electronic Signature(s) ?Signed: 01/30/2022 11:17:02 AM By: Enedina Finner RCP, RRT, CHT ?Entered By: Enedina Finner on 01/30/2022 11:15:43 ?[name], [name] ([id number]) ?-------------------------------------------------------------------------------- ?Vitals Details ?Patient Name: [name], [name] ?Date of Service: 01/30/2022 8:00 AM ?Medical Record Number: [id number] ?Patient Account Number: [id number] ?Date of Birth/Sex: 11/07/1965 (56 y.o. M) ?Treating RN: [name] ?Primary Care Zora Glendenning: [name] ?Other Clinician: Jacqulyn Bath ?Referring Auden Wettstein: [name] ?Treating Macayla Ekdahl/Extender: Jeri Cos ?Weeks in Treatment: 13 ?Vital Signs ?Time Taken: 08:04 ?Temperature (??F): 98.0 ?Height (in): 70 ?Pulse (bpm): 84 ?Weight (lbs): 180.4 ?Respiratory Rate (breaths/min): 16 ?Body Mass Index (BMI): 25.9 ?Blood Pressure (mmHg): 128/76 ?Capillary Blood Glucose (mg/dl): 163 ?Reference Range: 80 - 120 mg / dl ?Electronic Signature(s) ?Signed: 01/30/2022 11:17:02 AM By: Enedina Finner RCP, RRT, CHT ?Entered By: Enedina Finner on 01/30/2022 09:29:08 ?

## 2022-01-31 ENCOUNTER — Encounter: Payer: Medicare Other | Admitting: Physician Assistant

## 2022-01-31 DIAGNOSIS — I2510 Atherosclerotic heart disease of native coronary artery without angina pectoris: Secondary | ICD-10-CM | POA: Diagnosis not present

## 2022-01-31 DIAGNOSIS — L03116 Cellulitis of left lower limb: Secondary | ICD-10-CM | POA: Diagnosis not present

## 2022-01-31 DIAGNOSIS — E11621 Type 2 diabetes mellitus with foot ulcer: Secondary | ICD-10-CM | POA: Diagnosis not present

## 2022-01-31 DIAGNOSIS — D631 Anemia in chronic kidney disease: Secondary | ICD-10-CM | POA: Diagnosis not present

## 2022-01-31 DIAGNOSIS — L97528 Non-pressure chronic ulcer of other part of left foot with other specified severity: Secondary | ICD-10-CM | POA: Diagnosis not present

## 2022-01-31 DIAGNOSIS — N186 End stage renal disease: Secondary | ICD-10-CM | POA: Diagnosis not present

## 2022-01-31 DIAGNOSIS — I96 Gangrene, not elsewhere classified: Secondary | ICD-10-CM | POA: Diagnosis not present

## 2022-01-31 DIAGNOSIS — L97524 Non-pressure chronic ulcer of other part of left foot with necrosis of bone: Secondary | ICD-10-CM | POA: Diagnosis not present

## 2022-01-31 DIAGNOSIS — Z992 Dependence on renal dialysis: Secondary | ICD-10-CM | POA: Diagnosis not present

## 2022-01-31 DIAGNOSIS — N2581 Secondary hyperparathyroidism of renal origin: Secondary | ICD-10-CM | POA: Diagnosis not present

## 2022-01-31 DIAGNOSIS — I10 Essential (primary) hypertension: Secondary | ICD-10-CM | POA: Diagnosis not present

## 2022-01-31 DIAGNOSIS — L02612 Cutaneous abscess of left foot: Secondary | ICD-10-CM | POA: Diagnosis not present

## 2022-01-31 DIAGNOSIS — I5042 Chronic combined systolic (congestive) and diastolic (congestive) heart failure: Secondary | ICD-10-CM | POA: Diagnosis not present

## 2022-01-31 DIAGNOSIS — M86672 Other chronic osteomyelitis, left ankle and foot: Secondary | ICD-10-CM | POA: Diagnosis not present

## 2022-01-31 LAB — GLUCOSE, CAPILLARY
Glucose-Capillary: 199 mg/dL — ABNORMAL HIGH (ref 70–99)
Glucose-Capillary: 165 mg/dL — ABNORMAL HIGH (ref 70–99)

## 2022-01-31 NOTE — Progress Notes (Addendum)
Burkle, [place] ([id number]) ?Visit Report for 01/31/2022 ?Chief Complaint Document Details ?Patient Name: Zaring, Derold L. ?Date of Service: 01/31/2022 1:15 PM ?Medical Record Number: [id number] ?Patient Account Number: [id number] ?Date of Birth/Sex: 01/25/1966 (56 y.o. M) ?Treating RN: [name] ?Primary Care Provider: [name] Other Clinician: ?Referring Provider: [name] ?Treating Provider/Extender: Jeri Cos ?Weeks in Treatment: 13 ?Information Obtained from: Patient ?Chief Complaint ?HBO evaluation for treatment on the left foot diabetic ulcer with osteomyelitis. Patient is currently under our care as well for his foot. ?Electronic Signature(s) ?Signed: 01/31/2022 1:07:21 PM By: Worthy Keeler PA-C ?Entered By: Worthy Keeler on 01/31/2022 13:07:21 ?Sonnenberg, [place] ([id number]) ?-------------------------------------------------------------------------------- ?Debridement Details ?Patient Name: Almario, Yanixan L. ?Date of Service: 01/31/2022 1:15 PM ?Medical Record Number: [id number] ?Patient Account Number: [id number] ?Date of Birth/Sex: [birth year] (56 y.o. M) ?Treating RN: Cornell Barman ?Primary Care Provider: [name] Other Clinician: ?Referring Provider: [name] ?Treating Provider/Extender: Jeri Cos ?Weeks in Treatment: 13 ?Debridement Performed for ?Wound #2 Left,Lateral Foot ?Assessment: ?Performed By: Physician Tommie Sams., PA-C ?Debridement Type: Debridement ?Severity of Tissue Pre Debridement: Other severity specified ?Level of Consciousness (Pre- ?Awake and Alert ?procedure): ?Pre-procedure Verification/Time Out ?Yes - 13:05 ?Taken: ?Total Area Debrided (L x W): 4 (cm) x 2 (cm) = 8 (cm?) ?Tissue and other material ?Viable, Non-Viable, Slough, Subcutaneous, Tendon, Slough ?debrided: ?Level: Skin/Subcutaneous Tissue/Muscle ?Debridement Description: Excisional ?Instrument: Forceps, Scissors ?Bleeding: Minimum ?Hemostasis Achieved: Pressure ?Response to Treatment: Procedure was tolerated  well ?Level of Consciousness (Post- ?Awake and Alert ?procedure): ?Post Debridement Measurements of Total Wound ?Length: (cm) 4 ?Width: (cm) 2 ?Depth: (cm) 0.3 ?Volume: (cm?) 1.885 ?Character of Wound/Ulcer Post Debridement: Stable ?Severity of Tissue Post Debridement: Other severity specified ?Post Procedure Diagnosis ?Same as Pre-procedure ?Electronic Signature(s) ?Signed: 01/31/2022 4:28:26 PM By: Gretta Cool, BSN, RN, CWS, Kim RN, BSN ?Signed: 01/31/2022 4:44:12 PM By: Worthy Keeler PA-C ?Entered By: Gretta Cool, BSN, RN, CWS, Kim on 01/31/2022 13:14:15 ?Bilski, Terral ([id number]) ?-------------------------------------------------------------------------------- ?Debridement Details ?Patient Name: Ehrhard, Ademola L. ?Date of Service: 01/31/2022 1:15 PM ?Medical Record Number: [id number] ?Patient Account Number: [id number] ?Date of Birth/Sex: 05-29-1966 (56 y.o. M) ?Treating RN: Cornell Barman ?Primary Care Provider: [name] Other Clinician: ?Referring Provider: [name] ?Treating Provider/Extender: Jeri Cos ?Weeks in Treatment: 13 ?Debridement Performed for ?Wound #1 Left Amputation Site - Digit ?Assessment: ?Performed By: Physician Tommie Sams., PA-C ?Debridement Type: Debridement ?Level of Consciousness (Pre- ?Awake and Alert ?procedure): ?Pre-procedure Verification/Time Out ?Yes - 13:05 ?Taken: ?Total Area Debrided (L x W): 3.5 (cm) x 1.1 (cm) = 3.85 (cm?) ?Tissue and other material ?Viable, Non-Viable, Slough, Subcutaneous, Balta ?debrided: ?Level: Skin/Subcutaneous Tissue ?Debridement Description: Excisional ?Instrument: Curette ?Bleeding: Minimum ?Hemostasis Achieved: Pressure ?Response to Treatment: Procedure was tolerated well ?Level of Consciousness (Post- ?Awake and Alert ?procedure): ?Post Debridement Measurements of Total Wound ?Length: (cm) 3.5 ?Width: (cm) 1.1 ?Depth: (cm) 1 ?Volume: (cm?) 3.024 ?Character of Wound/Ulcer Post Debridement: Stable ?Post Procedure Diagnosis ?Same as  Pre-procedure ?Electronic Signature(s) ?Signed: 01/31/2022 1:16:10 PM By: Gretta Cool, BSN, RN, CWS, Kim RN, BSN ?Signed: 01/31/2022 4:44:12 PM By: Worthy Keeler PA-C ?Entered By: Gretta Cool, BSN, RN, CWS, Kim on 01/31/2022 13:16:10 ?Mahany, [place] ([id number]) ?-------------------------------------------------------------------------------- ?HPI Details ?Patient Name: Ploch, Nickalas L. ?Date of Service: 01/31/2022 1:15 PM ?Medical Record Number: [id number] ?Patient Account Number: [id number] ?Date of Birth/Sex: 05-14-1966 (56 y.o. M) ?Treating RN: [name] ?Primary Care Provider: [name] Other Clinician: ?Referring Provider: [name] ?Treating Provider/Extender: Jeri Cos ?Weeks in Treatment: 13 ?History of Present Illness ?  HPI Description: 10/31/2021 patient presents today for evaluation here in our clinic. He is actually being evaluated for hyperbaric oxygen therapy ?only. He has been seen at [facility] up to this point he is under the care of the vascular surgery specialty. Subsequently he is also ?under the care of the hyperbaric center there. With that being said that due to the fact that he is a dialysis patient he was actually recommended ?to be transferred to [place] for his treatments he actually lives in [place] which is where his dialysis is. It was a hardship for him to try to get from ?[place] especially on dialysis days all the way to [facility] get into the facility for his treatment and get back home he was literally spending ?all day long. He has had just 4 treatments there at [facility] thus far based on what I see in these have not been continued while he awaits approval ?here in our clinic. Subsequently I do have records for review that will be included in the HPI predetermination review and attached to this chart as ?well. I will not duplicate that here. 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 Nonetheless he continues to have exposed bone with ?marked soft tissue swelling compatible with osteomyelitis. This is due to the irregularity of the head of the metatarsal as well. Nonetheless along ?with having associated cellulitis he is also good to be seen by infectious disease and is currently on antibiotics for this as well. Again that will be ?detailed in the pretreatment review section which I will attach to this note as well. He does currently have a wound VAC in place and again the ?wound was not evaluated here in the clinic as that is being completely managed by home health and [facility] ?The patient does have a history of diabetes mellitus type 2, hypertension, coronary artery disease, and congestive heart failure. He also has ?cataracts of both eyes but no evidence of glaucoma on his most recent eye exam. ?11/22/2021 we have been seeing this gentleman for hyperbaric oxygen therapy but have not actually evaluated his wound up to this point this was ?being managed by the wound care and vascular team I presumed at [place] at least that is what has been told to be previous. Nonetheless at this ?point I got a call from Atlee Abide who is a Librarian, academic at the [facility] vascular clinic with Dr. Durene Fruits and subsequently they were wanting ?to know if I could take over wound care for this patient. Apparently they do not have the ability for an office debridement which again I completely ?understand but nonetheless definitely is not an integral part of his healing along with the hyperbarics. Subsequently I discussed with the patient ?today that I do believe he would benefit going ahead and proceeding with evaluation here in the clinic for that reason I did go ahead and see him ?today to get things started. There is also been some confusion about the issues with his home health nurse and getting measurements to [facility] for ?the wound VAC in order to  continue his wound VAC therapy. With that being said after I see him today we will make a determination on what to ?do with wound VAC we can definitely send measurements to [facility] but again the bigger question is whether this is

## 2022-01-31 NOTE — Progress Notes (Signed)
[name], [name] ([id number]) ?Visit Report for 01/31/2022 ?Arrival Information Details ?Patient Name: [name], [name] ?Date of Service: 01/31/2022 1:15 PM ?Medical Record Number: [id number] ?Patient Account Number: [id number] ?Date of Birth/Sex: 03/02/1966 (56 y.o. M) ?Treating RN: [name] ?Primary Care [name]: [name] Other Clinician: ?Referring [name]: [name] ?Treating [name]/Extender: [name] ?Weeks in Treatment: 13 ?Visit Information History Since Last Visit ?Added or deleted any medications: No ?Patient Arrived: Ambulatory ?Hospitalized since last visit: No ?Arrival Time: 12:50 ?Has Dressing in Place as Prescribed: Yes ?Accompanied By: self ?Pain Present Now: Yes ?Transfer Assistance: None ?Patient Identification Verified: Yes ?Secondary Verification Process Completed: Yes ?Patient Requires Transmission-Based No ?Precautions: ?Patient Has Alerts: Yes ?Patient Alerts: Patient on Blood ?Thinner ?DIALYSIS RIGHT ARM ?Aspirin 81/Brilinta ?Electronic Signature(s) ?Signed: 01/31/2022 4:28:26 PM By: [name], BSN, RN, CWS, Kim RN, BSN ?Entered By: [name], BSN, RN, CWS, [name] on 01/31/2022 12:53:53 ?[name], [name] ([id number]) ?-------------------------------------------------------------------------------- ?Clinic Level of Care Assessment Details ?Patient Name: [name], [name] ?Date of Service: 01/31/2022 1:15 PM ?Medical Record Number: [id number] ?Patient Account Number: [id number] ?Date of Birth/Sex: 08-08-1966 (56 y.o. M) ?Treating RN: [name] ?Primary Care Shavaun Osterloh: [name] Other Clinician: ?Referring Svea Pusch: [name] ?Treating Elijan Googe/Extender: [name] ?Weeks in Treatment: 13 ?Clinic Level of Care Assessment Items ?TOOL 1 Quantity Score ?[]  - Use when EandM and Procedure is performed on INITIAL visit 0 ?ASSESSMENTS - Nursing Assessment / Reassessment ?[]  - General Physical Exam (combine w/ comprehensive assessment (listed just below) when performed on new ?0 ?pt. evals) ?[]  -  0 ?Comprehensive Assessment (HX, ROS, Risk Assessments, Wounds Hx, etc.) ?ASSESSMENTS - Wound and Skin Assessment / Reassessment ?[]  - Dermatologic / Skin Assessment (not related to wound area) 0 ?ASSESSMENTS - Ostomy and/or Continence Assessment and Care ?[]  - Incontinence Assessment and Management 0 ?[]  - 0 ?Ostomy Care Assessment and Management (repouching, etc.) ?PROCESS - Coordination of Care ?[]  - Simple Patient / Family Education for ongoing care 0 ?[]  - 0 ?Complex (extensive) Patient / Family Education for ongoing care ?[]  - 0 ?Staff obtains Consents, Records, Test Results / Process Orders ?[]  - 0 ?Staff telephones HHA, Nursing Homes / Clarify orders / etc ?[]  - 0 ?Routine Transfer to another Facility (non-emergent condition) ?[]  - 0 ?Routine Hospital Admission (non-emergent condition) ?[]  - 0 ?New Admissions / Biomedical engineer / Ordering NPWT, Apligraf, etc. ?[]  - 0 ?Emergency Hospital Admission (emergent condition) ?PROCESS - Special Needs ?[]  - Pediatric / Minor Patient Management 0 ?[]  - 0 ?Isolation Patient Management ?[]  - 0 ?Hearing / Language / Visual special needs ?[]  - 0 ?Assessment of Community assistance (transportation, D/C planning, etc.) ?[]  - 0 ?Additional assistance / Altered mentation ?[]  - 0 ?Support Surface(s) Assessment (bed, cushion, seat, etc.) ?INTERVENTIONS - Miscellaneous ?[]  - External ear exam 0 ?[]  - 0 ?Patient Transfer (multiple staff / Civil Service fast streamer / Similar devices) ?[]  - 0 ?Simple Staple / Suture removal (25 or less) ?[]  - 0 ?Complex Staple / Suture removal (26 or more) ?[]  - 0 ?Hypo/Hyperglycemic Management (do not check if billed separately) ?[]  - 0 ?Ankle / Brachial Index (ABI) - do not check if billed separately ?Has the patient been seen at the hospital within the last three years: Yes ?Total Score: 0 ?Level Of Care: ____ ?[name], [name] ([id number]) ?Electronic Signature(s) ?Signed: 01/31/2022 4:28:26 PM By: [name], BSN, RN, CWS, Kim RN, BSN ?Entered By: [name], BSN,  RN, CWS, [name] on 01/31/2022 13:15:24 ?[name], [name] ([id number]) ?-------------------------------------------------------------------------------- ?Encounter Discharge Information Details ?Patient Name: [name],  [name] ?Date of Service: 01/31/2022 1:15 PM ?Medical Record Number: [id number] ?Patient Account Number: [id number] ?Date of Birth/Sex: [birth year] (56 y.o. M) ?Treating RN: [name] ?Primary Care [name]: [name] Other Clinician: ?Referring Clemencia Helzer: [name] ?Treating Mariana Wiederholt/Extender: [name] ?Weeks in Treatment: 13 ?Encounter Discharge Information Items Post Procedure Vitals ?Discharge Condition: Stable ?Temperature (?F): 98.5 ?Ambulatory Status: Ambulatory ?Pulse (bpm): 84 ?Discharge Destination: Home ?Respiratory Rate (breaths/min): 16 ?Transportation: Private Auto ?Blood Pressure (mmHg): 124/74 ?Accompanied By: self ?Schedule Follow-up Appointment: Yes ?Clinical Summary of Care: Patient Declined ?Electronic Signature(s) ?Signed: 01/31/2022 4:28:26 PM By: [name], BSN, RN, CWS, Kim RN, BSN ?Entered By: [name], BSN, RN, CWS, [name] on 01/31/2022 13:17:10 ?[name], [name] ([id number]) ?-------------------------------------------------------------------------------- ?Lower Extremity Assessment Details ?Patient Name: [name], [name] ?Date of Service: 01/31/2022 1:15 PM ?Medical Record Number: [id number] ?Patient Account Number: [id number] ?Date of Birth/Sex: [birth year] (56 y.o. M) ?Treating RN: [name] ?Primary Care [name]: [name] Other Clinician: ?Referring Askia Hazelip: [name] ?Treating Jamee Keach/Extender: [name] ?Weeks in Treatment: 13 ?Vascular Assessment ?Pulses: ?Dorsalis Pedis ?Palpable: [Left:Yes] ?Electronic Signature(s) ?Signed: 01/31/2022 4:28:26 PM By: [name], BSN, RN, CWS, Kim RN, BSN ?Entered By: [name], BSN, RN, CWS, [name] on 01/31/2022 12:59:41 ?[name], [name] ([id number]) ?-------------------------------------------------------------------------------- ?Multi Wound Chart  Details ?Patient Name: [name], [name] ?Date of Service: 01/31/2022 1:15 PM ?Medical Record Number: [id number] ?Patient Account Number: [id number] ?Date of Birth/Sex: 07/28/1966 (56 y.o. M) ?Treating RN: [name] ?Primary Care Kahliya Fraleigh: [name] Other Clinician: ?Referring Carrin Vannostrand: [name] ?Treating Myrtice Lowdermilk/Extender: [name] ?Weeks in Treatment: 13 ?Vital Signs ?Height(in): 70 ?Pulse(bpm): 84 ?Weight(lbs): 180.4 ?Blood Pressure(mmHg): 124/74 ?Body Mass Index(BMI): 25.9 ?Temperature(??F): 98.5 ?Respiratory Rate(breaths/min): 16 ?Photos: [N/A:N/A] ?Wound Location: Left Amputation Site - Digit Left, Lateral Foot N/A ?Wounding Event: Surgical Injury Blister N/A ?Primary Etiology: Open Surgical Wound Diabetic Wound/Ulcer of the Lower N/A ?Extremity ?Date Acquired: 09/09/2021 10/28/2021 N/A ?Weeks of Treatment: 10 9 N/A ?Wound Status: Open Open N/A ?Wound Recurrence: No No N/A ?Measurements L x W x D (cm) 3.5x1.1x1 4x2x0.6 N/A ?Area (cm?) : 3.024 6.283 N/A ?Volume (cm?) : 3.024 3.77 N/A ?% Reduction in Area: 78.60% -100.00% N/A ?% Reduction in Volume: 92.60% -1100.60% N/A ?Classification: Full Thickness With Exposed Grade 2 N/A ?Support Structures ?Exudate Amount: Medium Medium N/A ?Exudate Type: Serous Serosanguineous N/A ?Exudate Color: amber red, brown N/A ?Treatment Notes ?Electronic Signature(s) ?Signed: 01/31/2022 4:28:26 PM By: [name], BSN, RN, CWS, Kim RN, BSN ?Entered By: [name], BSN, RN, CWS, [name] on 01/31/2022 13:11:34 ?Sigel, [place] ([id number]) ?-------------------------------------------------------------------------------- ?Multi-Disciplinary Care Plan Details ?Patient Name: [name], [name] ?Date of Service: 01/31/2022 1:15 PM ?Medical Record Number: [id number] ?Patient Account Number: [id number] ?Date of Birth/Sex: 1966/01/15 (56 y.o. M) ?Treating RN: [name] ?Primary Care Keisuke Hollabaugh: [name] Other Clinician: ?Referring Janis Sol: [name] ?Treating Belton Peplinski/Extender: [name] ?Weeks  in Treatment: 13 ?Active Inactive ?HBO ?Nursing Diagnoses: ?Anxiety related to feelings of confinement associated with the hyperbaric oxygen chamber ?Anxiety related to knowledge deficit of hyperbaric oxyge

## 2022-01-31 NOTE — Progress Notes (Signed)
[place], [place] ([id number]) ?Visit Report for 01/31/2022 ?HBO Details ?Patient Name: [name], [name] ?Date of Service: 01/31/2022 10:30 AM ?Medical Record Number: [id number] ?Patient Account Number: [id number] ?Date of Birth/Sex: 04/17/1966 (56 y.o. 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 ?HBO Treatment Details ?Treatment Number: 62 ?Patient Type: Outpatient ?Chamber Type: Monoplace ?Chamber Serial #: [id number] ?Treatment Protocol: 2.0 ATA with 90 minutes oxygen, and no air breaks ?Treatment Details ?Compression Rate Down: 1.0 psi / minute ?De-Compression Rate Up: 1.5 psi / minute ?Compress Tx Pressure Air breaks and breathing periods Decompress Decompress ?Begins Reached (leave unused spaces blank) Begins Ends ?Chamber Pressure (ATA) 1 2 - - - - - - 2 1 ?Clock Time (24 hr) 09:53 10:10 - - - - - - 11:41 11:51 ?Treatment Length: 118 (minutes) ?Treatment Segments: 4 ?Vital Signs ?Capillary Blood Glucose Reference Range: 80 - 120 mg / dl ?HBO Diabetic Blood Glucose Intervention Range: <131 mg/dl or >249 mg/dl ?Time Vitals Blood Respiratory Capillary Blood Glucose Pulse Action ?Type: ?Pulse: Temperature: ?Taken: ?Pressure: ?Rate: ?Glucose (mg/dl): ?Meter #: Oximetry (%) Taken: ?Pre 09:39 124/74 84 16 98.5 199 1 none per protocol ?Post 11:55 126/82 72 16 98.6 165 1 none per protocol ?Treatment Response ?Treatment Toleration: Well ?Treatment Completion ?Treatment Completed without Adverse Event ?Status: ?Electronic Signature(s) ?Signed: 01/31/2022 2:09:56 PM By: Enedina Finner RCP, RRT, CHT ?Signed: 01/31/2022 4:44:12 PM By: Worthy Keeler  PA-C ?Entered By: Enedina Finner on 01/31/2022 14:07:04 ?[place], [place] ([id number]) ?-------------------------------------------------------------------------------- ?HBO Safety Checklist Details ?Patient Name: [name], [name] ?Date of Service: 01/31/2022 10:30 AM ?Medical Record Number: [id number] ?Patient Account Number: [id number] ?Date of Birth/Sex: [birth year] (56 y.o. M) ?Treating RN: [name] ?Primary Care Calleigh Lafontant: [name] ?Other Clinician: Jacqulyn Bath ?Referring Armando Bukhari: [name] ?Treating Jerimy Johanson/Extender: Jeri Cos ?Weeks in Treatment: 13 ?HBO Safety Checklist Items ?Safety Checklist ?Consent Form Signed ?Patient voided / foley secured and emptied ?When did you last eato 09:00 am ?Last dose of injectable or oral agent n/a ?Ostomy pouch emptied and vented if applicable ?NA ?All implantable devices assessed, documented and approved ?NA ?Intravenous access site secured and place ?NA ?Valuables secured ?Linens and cotton and cotton/polyester blend (less than 51% polyester) ?Personal oil-based products / skin lotions / body lotions removed ?Wigs or hairpieces removed ?NA ?Smoking or tobacco materials removed ?NA ?Books / newspapers / magazines / loose paper removed ?NA ?Cologne, aftershave, perfume and deodorant removed ?Jewelry removed (may wrap wedding band) ?Make-up removed ?NA ?Hair care products removed ?NA ?Battery operated devices (external) removed ?NA ?Heating patches and chemical warmers removed ?NA ?Titanium eyewear removed ?NA ?Nail polish cured greater than 10 hours ?NA ?Casting material cured greater than 10 hours ?NA ?Hearing aids removed ?NA ?Loose dentures or partials removed ?NA ?Prosthetics have been removed ?NA ?Patient demonstrates correct use of air break device (if applicable) ?Patient concerns have been addressed ?Patient grounding bracelet on and cord attached to chamber ?Specifics for Inpatients (complete in addition to ?above) ?Medication sheet sent with  patient ?Intravenous medications needed or due during therapy sent with patient ?Drainage tubes (e.g. nasogastric tube or chest tube secured and vented) ?Endotracheal or Tracheotomy tube secured ?Cuff deflated of air and inflated with saline ?Airway suctioned ?Electronic Signature(s) ?Signed:  01/31/2022 2:09:56 PM By: Enedina Finner RCP, RRT, CHT ?Entered By: Enedina Finner on 01/31/2022 09:57:25 ?

## 2022-01-31 NOTE — Progress Notes (Signed)
Loschiavo, [place] ([id number]) ?Visit Report for 01/31/2022 ?Arrival Information Details ?Patient Name: [name], [name] ?Date of Service: 01/31/2022 10:30 AM ?Medical Record Number: [id number] ?Patient Account Number: [id number] ?Date of Birth/Sex: [birth year] (56 y.o. M) ?Treating RN: [name] ?Primary Care Lajean Boese: [name] ?Other Clinician: Jacqulyn Bath ?Referring Jansel Vonstein: [name] ?Treating Bionca Mckey/Extender: Jeri Cos ?Weeks in Treatment: 13 ?Visit Information History Since Last Visit ?Added or deleted any medications: No ?Patient Arrived: Ambulatory ?Any new allergies or adverse reactions: No ?Arrival Time: 09:35 ?Had a fall or experienced change in No ?Accompanied By: self ?activities of daily living that may affect ?Transfer Assistance: None ?risk of falls: ?Patient Identification Verified: Yes ?Signs or symptoms of abuse/neglect since last visito No ?Secondary Verification Process Completed: Yes ?Hospitalized since last visit: No ?Patient Requires Transmission-Based No ?Implantable device outside of the clinic excluding No ?Precautions: ?cellular tissue based products placed in the center ?Patient Has Alerts: Yes ?since last visit: ?Patient Alerts: Patient on Blood ?Has Dressing in Place as Prescribed: Yes ?Thinner ?Has Footwear/Offloading in Place as Prescribed: Yes ?DIALYSIS RIGHT ARM ?Left: Other:Defender Boot ?Aspirin 81/Brilinta ?Pain Present Now: No ?Electronic Signature(s) ?Signed: 01/31/2022 2:09:56 PM By: Enedina Finner RCP, RRT, CHT ?Entered By: Enedina Finner on 01/31/2022 09:55:00 ?[name], [name] ([id number]) ?-------------------------------------------------------------------------------- ?Encounter Discharge Information Details ?Patient Name: [name], [name] ?Date of Service: 01/31/2022 10:30 AM ?Medical Record Number: [id number] ?Patient Account Number: [id number] ?Date of Birth/Sex: September 30, 1966 (56 y.o. M) ?Treating RN: [name] ?Primary Care Erikson Danzy: [name] ?Other Clinician: Jacqulyn Bath ?Referring Gail Vendetti: [name] ?Treating Martinez Boxx/Extender: Jeri Cos ?Weeks in Treatment: 13 ?Encounter Discharge Information Items ?Discharge Condition: Stable ?Ambulatory Status: Ambulatory ?Discharge Destination: Home ?Transportation: Private Auto ?Accompanied By: self ?Schedule Follow-up Appointment: Yes ?Clinical Summary of Care: ?Notes ?Patient has an HBO treatment scheduled on 02/03/22 at 11:00 am. ?Electronic Signature(s) ?Signed: 01/31/2022 2:09:56 PM By: Enedina Finner RCP, RRT, CHT ?Entered By: Enedina Finner on 01/31/2022 14:08:29 ?[name], [name] ([id number]) ?-------------------------------------------------------------------------------- ?Vitals Details ?Patient Name: [name], [name] ?Date of Service: 01/31/2022 10:30 AM ?Medical Record Number: [id number] ?Patient Account Number: [id number] ?Date of Birth/Sex: [birth year] (56 y.o. M) ?Treating RN: [name] ?Primary Care Manami Tutor: [name] ?Other Clinician: Jacqulyn Bath ?Referring Lulani Bour: [name] ?Treating Orville Widmann/Extender: Jeri Cos ?Weeks in Treatment: 13 ?Vital Signs ?Time Taken: 09:39 ?Temperature (??F): 98.5 ?Height (in): 70 ?Pulse (bpm): 84 ?Weight (lbs): 180.4 ?Respiratory Rate (breaths/min): 16 ?Body Mass Index (BMI): 25.9 ?Blood Pressure (mmHg): 124/74 ?Capillary Blood Glucose (mg/dl): 199 ?Reference Range: 80 - 120 mg / dl ?Electronic Signature(s) ?Signed: 01/31/2022 2:09:56 PM By: Enedina Finner RCP, RRT, CHT ?Entered By: Enedina Finner on 01/31/2022 09:56:08 ?

## 2022-02-02 DIAGNOSIS — Z89412 Acquired absence of left great toe: Secondary | ICD-10-CM | POA: Diagnosis not present

## 2022-02-02 DIAGNOSIS — Z992 Dependence on renal dialysis: Secondary | ICD-10-CM | POA: Diagnosis not present

## 2022-02-02 DIAGNOSIS — E1122 Type 2 diabetes mellitus with diabetic chronic kidney disease: Secondary | ICD-10-CM | POA: Diagnosis not present

## 2022-02-02 DIAGNOSIS — I2510 Atherosclerotic heart disease of native coronary artery without angina pectoris: Secondary | ICD-10-CM | POA: Diagnosis not present

## 2022-02-02 DIAGNOSIS — Z7982 Long term (current) use of aspirin: Secondary | ICD-10-CM | POA: Diagnosis not present

## 2022-02-02 DIAGNOSIS — E782 Mixed hyperlipidemia: Secondary | ICD-10-CM | POA: Diagnosis not present

## 2022-02-02 DIAGNOSIS — N186 End stage renal disease: Secondary | ICD-10-CM | POA: Diagnosis not present

## 2022-02-02 DIAGNOSIS — Z955 Presence of coronary angioplasty implant and graft: Secondary | ICD-10-CM | POA: Diagnosis not present

## 2022-02-02 DIAGNOSIS — E039 Hypothyroidism, unspecified: Secondary | ICD-10-CM | POA: Diagnosis not present

## 2022-02-02 DIAGNOSIS — E1142 Type 2 diabetes mellitus with diabetic polyneuropathy: Secondary | ICD-10-CM | POA: Diagnosis not present

## 2022-02-02 DIAGNOSIS — Z89422 Acquired absence of other left toe(s): Secondary | ICD-10-CM | POA: Diagnosis not present

## 2022-02-02 DIAGNOSIS — D631 Anemia in chronic kidney disease: Secondary | ICD-10-CM | POA: Diagnosis not present

## 2022-02-02 DIAGNOSIS — I120 Hypertensive chronic kidney disease with stage 5 chronic kidney disease or end stage renal disease: Secondary | ICD-10-CM | POA: Diagnosis not present

## 2022-02-02 DIAGNOSIS — Z89431 Acquired absence of right foot: Secondary | ICD-10-CM | POA: Diagnosis not present

## 2022-02-02 DIAGNOSIS — Z4781 Encounter for orthopedic aftercare following surgical amputation: Secondary | ICD-10-CM | POA: Diagnosis not present

## 2022-02-02 DIAGNOSIS — E1151 Type 2 diabetes mellitus with diabetic peripheral angiopathy without gangrene: Secondary | ICD-10-CM | POA: Diagnosis not present

## 2022-02-02 DIAGNOSIS — Z7902 Long term (current) use of antithrombotics/antiplatelets: Secondary | ICD-10-CM | POA: Diagnosis not present

## 2022-02-02 DIAGNOSIS — Z9181 History of falling: Secondary | ICD-10-CM | POA: Diagnosis not present

## 2022-02-03 ENCOUNTER — Encounter: Payer: Medicare Other | Admitting: Physician Assistant

## 2022-02-03 DIAGNOSIS — L97524 Non-pressure chronic ulcer of other part of left foot with necrosis of bone: Secondary | ICD-10-CM | POA: Diagnosis not present

## 2022-02-03 DIAGNOSIS — M86672 Other chronic osteomyelitis, left ankle and foot: Secondary | ICD-10-CM | POA: Diagnosis not present

## 2022-02-03 DIAGNOSIS — I2510 Atherosclerotic heart disease of native coronary artery without angina pectoris: Secondary | ICD-10-CM | POA: Diagnosis not present

## 2022-02-03 DIAGNOSIS — L02612 Cutaneous abscess of left foot: Secondary | ICD-10-CM | POA: Diagnosis not present

## 2022-02-03 DIAGNOSIS — I5042 Chronic combined systolic (congestive) and diastolic (congestive) heart failure: Secondary | ICD-10-CM | POA: Diagnosis not present

## 2022-02-03 DIAGNOSIS — N186 End stage renal disease: Secondary | ICD-10-CM | POA: Diagnosis not present

## 2022-02-03 DIAGNOSIS — N2581 Secondary hyperparathyroidism of renal origin: Secondary | ICD-10-CM | POA: Diagnosis not present

## 2022-02-03 DIAGNOSIS — E11621 Type 2 diabetes mellitus with foot ulcer: Secondary | ICD-10-CM | POA: Diagnosis not present

## 2022-02-03 DIAGNOSIS — I96 Gangrene, not elsewhere classified: Secondary | ICD-10-CM | POA: Diagnosis not present

## 2022-02-03 DIAGNOSIS — I10 Essential (primary) hypertension: Secondary | ICD-10-CM | POA: Diagnosis not present

## 2022-02-03 DIAGNOSIS — Z992 Dependence on renal dialysis: Secondary | ICD-10-CM | POA: Diagnosis not present

## 2022-02-03 DIAGNOSIS — D631 Anemia in chronic kidney disease: Secondary | ICD-10-CM | POA: Diagnosis not present

## 2022-02-03 DIAGNOSIS — Z5181 Encounter for therapeutic drug level monitoring: Secondary | ICD-10-CM | POA: Diagnosis not present

## 2022-02-03 LAB — GLUCOSE, CAPILLARY
Glucose-Capillary: 132 mg/dL — ABNORMAL HIGH (ref 70–99)
Glucose-Capillary: 136 mg/dL — ABNORMAL HIGH (ref 70–99)

## 2022-02-03 NOTE — Progress Notes (Signed)
[place], [place] ([id number]) ?Visit Report for 02/03/2022 ?Arrival Information Details ?Patient Name: Moosman, Casmer L. ?Date of Service: 02/03/2022 11:00 AM ?Medical Record Number: [id number] ?Patient Account Number: [id number] ?Date of Birth/Sex: 1966-03-03 (56 y.o. M) ?Treating RN: [name] ?Primary Care Nitzia Perren: [name] ?Other Clinician: Jacqulyn Bath ?Referring Macio Kissoon: [name] ?Treating Kadeem Hyle/Extender: Jeri Cos ?Weeks in Treatment: 13 ?Visit Information History Since Last Visit ?Added or deleted any medications: No ?Patient Arrived: Ambulatory ?Any new allergies or adverse reactions: No ?Arrival Time: 10:09 ?Had a fall or experienced change in No ?Accompanied By: self ?activities of daily living that may affect ?Transfer Assistance: None ?risk of falls: ?Patient Identification Verified: Yes ?Signs or symptoms of abuse/neglect since last visito No ?Secondary Verification Process Completed: Yes ?Hospitalized since last visit: No ?Patient Requires Transmission-Based No ?Implantable device outside of the clinic excluding No ?Precautions: ?cellular tissue based products placed in the center ?Patient Has Alerts: Yes ?since last visit: ?Patient Alerts: Patient on Blood ?Has Dressing in Place as Prescribed: Yes ?Thinner ?Has Footwear/Offloading in Place as Prescribed: Yes ?DIALYSIS RIGHT ARM ?Left: Other:Defender Boot ?Aspirin 81/Brilinta ?Pain Present Now: No ?Electronic Signature(s) ?Signed: 02/03/2022 1:01:47 PM By: Enedina Finner RCP, RRT, CHT ?Entered By: Enedina Finner on 02/03/2022 10:35:47 ?[place], [place] ([id number]) ?-------------------------------------------------------------------------------- ?Encounter Discharge Information Details ?Patient Name: Fitzmaurice, Mahmud L. ?Date of Service: 02/03/2022 11:00 AM ?Medical Record Number: [id number] ?Patient Account Number: [id number] ?Date of Birth/Sex: 1966-04-21 (56 y.o. M) ?Treating RN: [name] ?Primary Care Isadora Delorey: [name] ?Other Clinician: Jacqulyn Bath ?Referring Lindsay Soulliere: [name] ?Treating Crosby Bevan/Extender: Jeri Cos ?Weeks in Treatment: 13 ?Encounter Discharge Information Items ?Discharge Condition: Stable ?Ambulatory Status: Ambulatory ?Discharge Destination: Home ?Transportation: Private Auto ?Accompanied By: self ?Schedule Follow-up Appointment: Yes ?Clinical Summary of Care: ?Notes ?Patient has an HBO treatment scheduled on 02/04/22 at 10:30 am. ?Electronic Signature(s) ?Signed: 02/03/2022 1:01:47 PM By: Enedina Finner RCP, RRT, CHT ?Entered By: Enedina Finner on 02/03/2022 13:01:06 ?[place], [place] ([id number]) ?-------------------------------------------------------------------------------- ?Vitals Details ?Patient Name: Lybrand, Finnick L. ?Date of Service: 02/03/2022 11:00 AM ?Medical Record Number: [id number] ?Patient Account Number: [id number] ?Date of Birth/Sex: [birth year] (56 y.o. M) ?Treating RN: [name] ?Primary Care Rella Egelston: [name] ?Other Clinician: Jacqulyn Bath ?Referring Renton Berkley: [name] ?Treating Byrl Latin/Extender: Jeri Cos ?Weeks in Treatment: 13 ?Vital Signs ?Time Taken: 10:21 ?Temperature (??F): 98.4 ?Height (in): 70 ?Pulse (bpm): 66 ?Weight (lbs): 180.4 ?Respiratory Rate (breaths/min): 16 ?Body Mass Index (BMI): 25.9 ?Blood Pressure (mmHg): 126/72 ?Capillary Blood Glucose (mg/dl): 132 ?Reference Range: 80 - 120 mg / dl ?Electronic Signature(s) ?Signed: 02/03/2022 1:01:47 PM By: Enedina Finner RCP, RRT, CHT ?Entered By: Enedina Finner on 02/03/2022 10:42:28 ?

## 2022-02-03 NOTE — Progress Notes (Addendum)
Dartt, [place] ([id number]) ?Visit Report for 02/03/2022 ?HBO Details ?Patient Name: [name], [name] ?Date of Service: 02/03/2022 11:00 AM ?Medical Record Number: [id number] ?Patient Account Number: [id number] ?Date of Birth/Sex: 1965-10-27 (56 y.o. M) ?Treating RN: [name] ?Primary Care Karrisa [name]: [name] ?Other Clinician: Jacqulyn Bath ?Referring Judeth Gilles: [name] ?Treating Amaya Blakeman/Extender: Jeri Cos ?Weeks in Treatment: 13 ?HBO Treatment Course Details ?Treatment Course Number: 1 ?Ordering Nickisha Hum: Jeri Cos ?Total Treatments Ordered: 60 ?HBO Treatment Start Date: 11/04/2021 ?HBO Indication: ?Chronic Refractory Osteomyelitis to Left Foot ?HBO Treatment Details ?Treatment Number: 64 ?Patient Type: Outpatient ?Chamber Type: Monoplace ?Chamber Serial #: [id number] ?Treatment Protocol: 2.0 ATA with 90 minutes oxygen, and no air breaks ?Treatment Details ?Compression Rate Down: 1.0 psi / minute ?De-Compression Rate Up: 1.5 psi / minute ?Compress Tx Pressure Air breaks and breathing periods Decompress Decompress ?Begins Reached (leave unused spaces blank) Begins Ends ?Chamber Pressure (ATA) 1 2 - - - - - - 2 1 ?Clock Time (24 hr) 10:35 10:51 - - - - - - 12:22 12:32 ?Treatment Length: 117 (minutes) ?Treatment Segments: 4 ?Vital Signs ?Capillary Blood Glucose Reference Range: 80 - 120 mg / dl ?HBO Diabetic Blood Glucose Intervention Range: <131 mg/dl or >249 mg/dl ?Time Vitals Blood Respiratory Capillary Blood Glucose Pulse Action ?Type: ?Pulse: Temperature: ?Taken: ?Pressure: ?Rate: ?Glucose (mg/dl): ?Meter #: Oximetry (%) Taken: ?Pre 10:21 126/72 66 16 98.4 132 1 none per protocol ?Post 12:36 132/74 66 16 98.3 136 1 none per protocol ?Treatment Response ?Treatment Toleration: Well ?Treatment Completion ?Treatment Completed without Adverse Event ?Status: ?HBO Attestation ?I certify that I supervised this HBO treatment in accordance with ?Medicare guidelines. A trained emergency response team is readily  Yes ?available per hospital policies and procedures. ?Continue HBOT as ordered. Yes ?Electronic Signature(s) ?Signed: 02/03/2022 4:52:05 PM By: Worthy Keeler PA-C ?Previous Signature: 02/03/2022 1:01:47 PM Version By: Enedina Finner RCP, RRT, CHT ?Previous Signature: 02/03/2022 4:40:44 PM Version By: Worthy Keeler PA-C ?Entered By: Worthy Keeler on 02/03/2022 16:52:04 ?[place], [place] ([id number]) ?-------------------------------------------------------------------------------- ?HBO Safety Checklist Details ?Patient Name: [name], [name] ?Date of Service: 02/03/2022 11:00 AM ?Medical Record Number: [id number] ?Patient Account Number: [id number] ?Date of Birth/Sex: [birth year] (56 y.o. M) ?Treating RN: [name] ?Primary Care Joslynn Jamroz: [name] ?Other Clinician: Jacqulyn Bath ?Referring Sharvil Hoey: [name] ?Treating Jacie Tristan/Extender: Jeri Cos ?Weeks in Treatment: 13 ?HBO Safety Checklist Items ?Safety Checklist ?Consent Form Signed ?Patient voided / foley secured and emptied ?When did you last eato 09:30 am ?Last dose of injectable or oral agent n/a ?Ostomy pouch emptied and vented if applicable ?NA ?All implantable devices assessed, documented and approved ?NA ?Intravenous access site secured and place ?NA ?Valuables secured ?Linens and cotton and cotton/polyester blend (less than 51% polyester) ?Personal oil-based products / skin lotions / body lotions removed ?Wigs or hairpieces removed ?NA ?Smoking or tobacco materials removed ?NA ?Books / newspapers / magazines / loose paper removed ?NA ?Cologne, aftershave, perfume and deodorant removed ?Jewelry removed (may wrap wedding band) ?Make-up removed ?NA ?Hair care products removed ?Battery operated devices (external) removed ?NA ?Heating patches and chemical warmers removed ?NA ?Titanium eyewear removed ?NA ?Nail polish cured greater than 10 hours ?NA ?Casting material cured greater than 10 hours ?NA ?Hearing aids removed ?NA ?Loose dentures  or partials removed ?NA ?Prosthetics have been removed ?NA ?Patient demonstrates correct use of air break device (if applicable) ?Patient concerns have been addressed ?Patient grounding bracelet on and cord attached to chamber ?Specifics for Inpatients (complete in addition  to ?above) ?Medication sheet sent with patient ?Intravenous medications needed or due during therapy sent with patient ?Drainage tubes (e.g. nasogastric tube or chest tube secured and vented) ?Endotracheal or Tracheotomy tube secured ?Cuff deflated of air and inflated with saline ?Airway suctioned ?Electronic Signature(s) ?Signed: 02/03/2022 1:01:47 PM By: Enedina Finner RCP, RRT, CHT ?Entered By: Enedina Finner on 02/03/2022 10:43:26 ?

## 2022-02-04 ENCOUNTER — Encounter: Payer: Medicare Other | Admitting: Physician Assistant

## 2022-02-04 DIAGNOSIS — E11621 Type 2 diabetes mellitus with foot ulcer: Secondary | ICD-10-CM | POA: Diagnosis not present

## 2022-02-04 DIAGNOSIS — E1122 Type 2 diabetes mellitus with diabetic chronic kidney disease: Secondary | ICD-10-CM | POA: Diagnosis not present

## 2022-02-04 DIAGNOSIS — I2510 Atherosclerotic heart disease of native coronary artery without angina pectoris: Secondary | ICD-10-CM | POA: Diagnosis not present

## 2022-02-04 DIAGNOSIS — L97524 Non-pressure chronic ulcer of other part of left foot with necrosis of bone: Secondary | ICD-10-CM | POA: Diagnosis not present

## 2022-02-04 DIAGNOSIS — I5042 Chronic combined systolic (congestive) and diastolic (congestive) heart failure: Secondary | ICD-10-CM | POA: Diagnosis not present

## 2022-02-04 DIAGNOSIS — E1151 Type 2 diabetes mellitus with diabetic peripheral angiopathy without gangrene: Secondary | ICD-10-CM | POA: Diagnosis not present

## 2022-02-04 DIAGNOSIS — I10 Essential (primary) hypertension: Secondary | ICD-10-CM | POA: Diagnosis not present

## 2022-02-04 DIAGNOSIS — N186 End stage renal disease: Secondary | ICD-10-CM | POA: Diagnosis not present

## 2022-02-04 DIAGNOSIS — M86672 Other chronic osteomyelitis, left ankle and foot: Secondary | ICD-10-CM | POA: Diagnosis not present

## 2022-02-04 DIAGNOSIS — D631 Anemia in chronic kidney disease: Secondary | ICD-10-CM | POA: Diagnosis not present

## 2022-02-04 DIAGNOSIS — Z4781 Encounter for orthopedic aftercare following surgical amputation: Secondary | ICD-10-CM | POA: Diagnosis not present

## 2022-02-04 DIAGNOSIS — I120 Hypertensive chronic kidney disease with stage 5 chronic kidney disease or end stage renal disease: Secondary | ICD-10-CM | POA: Diagnosis not present

## 2022-02-04 LAB — GLUCOSE, CAPILLARY
Glucose-Capillary: 186 mg/dL — ABNORMAL HIGH (ref 70–99)
Glucose-Capillary: 136 mg/dL — ABNORMAL HIGH (ref 70–99)

## 2022-02-04 NOTE — Progress Notes (Signed)
[place], [place] ([id number]) ?Visit Report for 02/04/2022 ?Arrival Information Details ?Patient Name: [name], [name] ?Date of Service: 02/04/2022 11:00 AM ?Medical Record Number: [id number] ?Patient Account Number: [id number] ?Date of Birth/Sex: 05-Feb-1966 (56 y.o. M) ?Treating RN: [name] ?Primary Care Colman Birdwell: [name] ?Other Clinician: Jacqulyn Bath ?Referring Marco Raper: [name] ?Treating Shyleigh Daughtry/Extender: Jeri Cos ?Weeks in Treatment: 13 ?Visit Information History Since Last Visit ?Added or deleted any medications: No ?Patient Arrived: Ambulatory ?Any new allergies or adverse reactions: No ?Arrival Time: 10:10 ?Had a fall or experienced change in No ?Accompanied By: self ?activities of daily living that may affect ?Transfer Assistance: None ?risk of falls: ?Patient Identification Verified: Yes ?Signs or symptoms of abuse/neglect since last visito No ?Secondary Verification Process Completed: Yes ?Hospitalized since last visit: No ?Patient Requires Transmission-Based No ?Implantable device outside of the clinic excluding No ?Precautions: ?cellular tissue based products placed in the center ?Patient Has Alerts: Yes ?since last visit: ?Patient Alerts: Patient on Blood ?Has Dressing in Place as Prescribed: Yes ?Thinner ?Has Footwear/Offloading in Place as Prescribed: Yes ?DIALYSIS RIGHT ARM ?Left: Other:Defender Boot ?Aspirin 81/Brilinta ?Pain Present Now: No ?Electronic Signature(s) ?Signed: 02/04/2022 4:30:32 PM By: Enedina Finner RCP, RRT, CHT ?Entered By: Enedina Finner on 02/04/2022 12:03:57 ?[name], [name] ([id number]) ?-------------------------------------------------------------------------------- ?Encounter Discharge Information Details ?Patient Name: [name], [name] ?Date of Service: 02/04/2022 11:00 AM ?Medical Record Number: [id number] ?Patient Account Number: [id number] ?Date of Birth/Sex: Dec 18, 1965 (56 y.o. M) ?Treating RN: [name] ?Primary Care Deontra Pereyra: [name] ?Other Clinician: Jacqulyn Bath ?Referring Cybil Senegal: [name] ?Treating Lennie Dunnigan/Extender: Jeri Cos ?Weeks in Treatment: 13 ?Encounter Discharge Information Items ?Discharge Condition: Stable ?Ambulatory Status: Ambulatory ?Discharge Destination: Home ?Transportation: Private Auto ?Accompanied By: self ?Schedule Follow-up Appointment: Yes ?Clinical Summary of Care: ?Notes ?Patient has an HBO treatment scheduled on 02/05/22 at 11:00 am. ?Electronic Signature(s) ?Signed: 02/04/2022 4:30:32 PM By: Enedina Finner RCP, RRT, CHT ?Entered By: Enedina Finner on 02/04/2022 16:13:20 ?[name], [name] ([id number]) ?-------------------------------------------------------------------------------- ?Vitals Details ?Patient Name: [name], [name] ?Date of Service: 02/04/2022 11:00 AM ?Medical Record Number: [id number] ?Patient Account Number: [id number] ?Date of Birth/Sex: Dec 03, 1965 (56 y.o. M) ?Treating RN: [name] ?Primary Care Leigh Kaeding: [name] ?Other Clinician: Jacqulyn Bath ?Referring Antoinne Spadaccini: [name] ?Treating Sharanda Shinault/Extender: Jeri Cos ?Weeks in Treatment: 13 ?Vital Signs ?Time Taken: 10:12 ?Temperature (??F): 98.2 ?Height (in): 70 ?Pulse (bpm): 60 ?Weight (lbs): 180.4 ?Respiratory Rate (breaths/min): 16 ?Body Mass Index (BMI): 25.9 ?Blood Pressure (mmHg): 122/72 ?Capillary Blood Glucose (mg/dl): 186 ?Reference Range: 80 - 120 mg / dl ?Electronic Signature(s) ?Signed: 02/04/2022 4:30:32 PM By: Enedina Finner RCP, RRT, CHT ?Entered By: Enedina Finner on 02/04/2022 12:04:35 ?

## 2022-02-04 NOTE — Progress Notes (Signed)
[place], [place] ([id number]) ?Visit Report for 01/28/2022 ?Physician Orders Details ?Patient Name: [name], [name] ?Date of Service: 01/28/2022 8:00 AM ?Medical Record Number: [id number] ?Patient Account Number: [id number] ?Date of Birth/Sex: 06-25-1966 (56 y.o. M) ?Treating RN: [name] ?Primary Care Provider: [name] ?Other Clinician: Jacqulyn Bath ?Referring Provider: [name] ?Treating Provider/Extender: Jeri Cos ?Weeks in Treatment: 12 ?Verbal / Phone Orders: No ?Diagnosis Coding ?Follow-up Appointments ?o Return Appointment in 1 week. - provider visits at wound center ?o Return Appointment in: - HBO- (daily Monday -Friday for HBO Treatments) ?Home Health ?[place] ?o [place] for wound care. May utilize formulary equivalent dressing for wound treatment orders unless ?otherwise specified. Home Health Nurse may visit PRN to address patientos wound care needs. ?o **Please direct any NON-WOUND related issues/requests for orders to patient's Primary Care Physician. **If current ?dressing causes regression in wound condition, may D/C ordered dressing product/s and apply Normal Saline Moist ?Dressing daily until next [facility] or Other MD appointment. **Notify [facility] of regression in ?wound condition at [phone]. ?o Other Home Health Orders/Instructions: - HOLD WOUND VAC AT THIS TIME ?Bathing/ Shower/ Hygiene ?o Clean wound with Normal Saline or wound cleanser. ?o May shower with wound dressing protected with water repellent cover or cast protector. ?o No tub bath. 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 ?o Follow Hyperbaric Oxygen Glycemia Protocol ?Wound Treatment ?Wound #1 - Amputation Site - Digit Wound Laterality: Left ?Cleanser: Normal Saline 1 x Per Day/30 Days ?Discharge Instructions: Wash your hands with soap and water. Remove old dressing, discard into plastic bag and place into trash. ?Cleanse the wound with Normal Saline prior to applying a clean dressing using gauze sponges, not tissues or cotton balls. Do not scrub ?or use excessive force. Pat dry using gauze sponges, not tissue or cotton balls. ?Cleanser: Vashe 5.8 (oz) 1 x Per Day/30 Days ?Discharge Instructions: Use vashe 5.8 (oz) as directed ?Cleanser: Wound Cleanser 1 x Per Day/30 Days ?Discharge Instructions: Wash your hands with soap and water. Remove old dressing, discard into plastic bag and place into trash. ?Cleanse the wound with Wound Cleanser prior to applying a clean dressing using gauze sponges, not tissues or cotton balls. Do not ?scrub or use excessive force. Pat dry using gauze sponges, not tissue or cotton balls. ?Primary Dressing: Gauze 1 x Per Day/30 Days ?Discharge Instructions: VASHE/DAKINS-As directed: dry, moistened with saline or moistened with Dakins Solution ?[place], [place] ([id number]) ?Secondary Dressing: ABD Pad 5x9 (in/in) 1 x Per Day/30 Days ?Discharge Instructions: Cover with ABD pad ?Secured With: Medipore Tape - 25M Medipore H Soft Cloth Surgical Tape, 2x2 (in/yd) 1 x Per Day/30 Days ?Secured With: [employer] or Non-Sterile 6-ply 4.5x4 (yd/yd) 1 x Per Day/30 Days ?Discharge Instructions: Apply Kerlix as directed ?Wound #2 - Foot Wound Laterality: Left,  Lateral ?Primary Dressing: Gauze 1  x Per Day/30 Days ?Discharge Instructions: moistened with Dakins Solution or Vashe ?Secondary Dressing: ABD Pad 5x9 (in/in) 1 x Per Day/30 Days ?Discharge Instructions: Cover with ABD pad ?Secondary Dressing: Kerlix 4.5 x 4.1 (in/yd) 1 x Per Day/30 Days ?Discharge Instructions: lightly ?Secured With: Medipore Tape - 74M Medipore H Soft Cloth Surgical Tape, 2x2 (in/yd) 1 x Per Day/30 Days ?Laboratory ?o C reactive protein [Mass/volume] in Serum or Plasma (CHEM) - labs to be drawn on next dialysis day then weekly with results faxed ?to Jeri Cos PA at [phone] ?oooo LOINC Code: 2440-1 ?oooo Convenience Name: C Reactive Protein in serum or plasma ?o Erythrocyte sedimentation rate (HEM) - labs to be drawn on next dialysis day then weekly with results faxed to Jeri Cos PA at [phone] ?oooo LOINC Code: [phone] ?oooo Convenience Name: Sed rateo-method unspecified ?o CBC W Auto Differential panel in Blood (HEM-CBC) - labs to be drawn on next dialysis day then weekly with results faxed to [facility] ?[facility] at [phone] ?oooo LOINC Code: 56433-2 ?oooo Convenience Name: CBC W Auto Differential panel ?GLYCEMIA INTERVENTIONS PROTOCOL ?PRE-HBO GLYCEMIA INTERVENTIONS ?ACTION INTERVENTION ?Obtain pre-HBO capillary blood glucose (ensure ?1 ?physician order is in chart). ?A. Notify HBO physician and await physician ?orders. ?2 If result is 70 mg/dl or below: ?B. If the result meets the hospital definition ?of a critical result, follow hospital policy. ?A. Give patient an 8 ounce Glucerna Shake??, ?an 8 ounce Ensure??, or 8 ounces of a ?Glucerna/Ensure equivalent dietary ?supplement*. ?B. Wait 30 minutes. ?If result is 71 mg/dl to 130 mg/dl: C. Retest patientos capillary blood glucose ?(CBG). ?D. If result greater than or equal to 110 ?mg/dl, proceed with HBO. ?If result less than 110 mg/dl, notify HBO ?physician and consider holding HBO. ?If result is 131 mg/dl to 249 mg/dl: A.  Proceed with HBO. ?A. Notify HBO physician and await physician ?orders. ?B. It is recommended to hold HBO and do ?If result is 250 mg/dl or greater: ?blood/urine ketone testing. ?C. If the result meets the hospital definition ?of a critical result, follow hospital policy. ?POST-HBO GLYCEMIA INTERVENTIONS ?[place], [place] ([id number]) ?ACTION INTERVENTION ?Obtain post HBO capillary blood glucose (ensure ?1 ?physician order is in chart). ?A. Notify HBO physician and await physician ?orders. ?2 If result is 70 mg/dl or below: ?B. If the result meets the hospital definition ?of a critical result, follow hospital policy. ?A. Give patient an 8 ounce Glucerna Shake??, ?an 8 ounce Ensure??, or 8 ounces of a ?Glucerna/Ensure equivalent dietary ?supplement*. ?B. Wait 15 minutes for symptoms of ?hypoglycemia (i.e. nervousness, anxiety, ?If result is 71 mg/dl to 100 mg/dl: sweating, chills, clamminess, irritability, ?confusion, tachycardia or dizziness). ?C. If patient asymptomatic, discharge ?patient. ?If patient symptomatic, repeat capillary ?blood glucose (CBG) and notify HBO ?physician. ?If result is 101 mg/dl to 249 mg/dl: A. Discharge patient. ?A. Notify HBO physician and await physician ?orders. ?B. It is recommended to do blood/urine ?If result is 250 mg/dl or greater: ?ketone testing. ?C. If the result meets the hospital definition ?of a critical result, follow hospital policy. ?*Juice or candies are NOT equivalent products. If patient refuses the Glucerna or Ensure, please consult the ?hospital dietitian for an appropriate substitute. ?Electronic Signature(s) ?Signed: 01/28/2022 5:16:21 PM By: Worthy Keeler PA-C ?Signed: 02/04/2022 9:16:22 AM By: [name] RN ?Previous Signature: 01/28/2022 4:19:30 PM Version By: [name] RN ?Entered By: [name] on 01/28/2022 16:43:06 ?[place], [place] ([id number]) ?-------------------------------------------------------------------------------- ?Prescription  01/28/2022 ?Patient Name: Berkley, Jarrett L. ?Provider: Joaquim Lai,  Margarita Grizzle PA-C ?Date of Birth: October 20, 1966 ?NPI#: [id number] ?Sex: M ?DEA#: [id number] ?Phone #: [phone] License #: ?Patient Address: [place]

## 2022-02-05 ENCOUNTER — Encounter (HOSPITAL_BASED_OUTPATIENT_CLINIC_OR_DEPARTMENT_OTHER): Payer: Medicare Other | Admitting: Internal Medicine

## 2022-02-05 DIAGNOSIS — D631 Anemia in chronic kidney disease: Secondary | ICD-10-CM | POA: Diagnosis not present

## 2022-02-05 DIAGNOSIS — I10 Essential (primary) hypertension: Secondary | ICD-10-CM | POA: Diagnosis not present

## 2022-02-05 DIAGNOSIS — I2510 Atherosclerotic heart disease of native coronary artery without angina pectoris: Secondary | ICD-10-CM | POA: Diagnosis not present

## 2022-02-05 DIAGNOSIS — I96 Gangrene, not elsewhere classified: Secondary | ICD-10-CM | POA: Diagnosis not present

## 2022-02-05 DIAGNOSIS — L02612 Cutaneous abscess of left foot: Secondary | ICD-10-CM | POA: Diagnosis not present

## 2022-02-05 DIAGNOSIS — I5042 Chronic combined systolic (congestive) and diastolic (congestive) heart failure: Secondary | ICD-10-CM | POA: Diagnosis not present

## 2022-02-05 DIAGNOSIS — Z992 Dependence on renal dialysis: Secondary | ICD-10-CM | POA: Diagnosis not present

## 2022-02-05 DIAGNOSIS — N2581 Secondary hyperparathyroidism of renal origin: Secondary | ICD-10-CM | POA: Diagnosis not present

## 2022-02-05 DIAGNOSIS — E11621 Type 2 diabetes mellitus with foot ulcer: Secondary | ICD-10-CM

## 2022-02-05 DIAGNOSIS — N186 End stage renal disease: Secondary | ICD-10-CM | POA: Diagnosis not present

## 2022-02-05 DIAGNOSIS — M86672 Other chronic osteomyelitis, left ankle and foot: Secondary | ICD-10-CM | POA: Diagnosis not present

## 2022-02-05 DIAGNOSIS — L97524 Non-pressure chronic ulcer of other part of left foot with necrosis of bone: Secondary | ICD-10-CM | POA: Diagnosis not present

## 2022-02-05 LAB — GLUCOSE, CAPILLARY
Glucose-Capillary: 137 mg/dL — ABNORMAL HIGH (ref 70–99)
Glucose-Capillary: 135 mg/dL — ABNORMAL HIGH (ref 70–99)

## 2022-02-05 NOTE — Progress Notes (Signed)
[place], [place] ([id number]) ?Visit Report for 02/05/2022 ?Arrival Information Details ?Patient Name: [name], [name] ?Date of Service: 02/05/2022 11:00 AM ?Medical Record Number: [id number] ?Patient Account Number: [id number] ?Date of Birth/Sex: November 19, 1965 (56 y.o. M) ?Treating RN: [name] ?Primary Care Lemont Sitzmann: [name] ?Other Clinician: Jacqulyn Bath ?Referring Grissel Tyrell: [name] ?Treating Ninetta Adelstein/Extender: Kalman Shan ?Weeks in Treatment: 13 ?Visit Information History Since Last Visit ?Added or deleted any medications: No ?Patient Arrived: Ambulatory ?Any new allergies or adverse reactions: No ?Arrival Time: 10:20 ?Had a fall or experienced change in No ?Accompanied By: self ?activities of daily living that may affect ?Transfer Assistance: None ?risk of falls: ?Patient Requires Transmission-Based No ?Signs or symptoms of abuse/neglect since last visito No ?Precautions: ?Hospitalized since last visit: No ?Patient Has Alerts: Yes ?Implantable device outside of the clinic excluding No ?Patient Alerts: Patient on Blood ?cellular tissue based products placed in the center ?[place] ?since last visit: ?DIALYSIS RIGHT ARM ?Pain Present Now: No ?Aspirin 81/Brilinta ?Electronic Signature(s) ?Signed: 02/05/2022 2:31:17 PM By: Enedina Finner RCP, RRT, CHT ?Entered By: Enedina Finner on 02/05/2022 11:23:40 ?[place], [place] ([id number]) ?-------------------------------------------------------------------------------- ?Encounter Discharge Information Details ?Patient Name: [name], [name] ?Date of Service: 02/05/2022 11:00 AM ?Medical Record Number: [id number] ?Patient Account Number: [id number] ?Date of Birth/Sex: November 11, 1965 (56 y.o. M) ?Treating RN: [name] ?Primary Care Samil Mecham: [name] ?Other Clinician: Jacqulyn Bath ?Referring Sherica Paternostro: [name] ?Treating Anelis Hrivnak/Extender: Kalman Shan ?Weeks in Treatment: 13 ?Encounter Discharge Information Items ?Discharge  Condition: Stable ?Ambulatory Status: Ambulatory ?Discharge Destination: Home ?Transportation: Private Auto ?Accompanied By: self ?Schedule Follow-up Appointment: Yes ?Clinical Summary of Care: ?Notes ?Patient has an HBO treatment scheduled on 02/06/22 at 11:00 am. ?Electronic Signature(s) ?Signed: 02/05/2022 2:31:17 PM By: Enedina Finner RCP, RRT, CHT ?Entered By: Enedina Finner on 02/05/2022 14:27:36 ?[place], [place] ([id number]) ?-------------------------------------------------------------------------------- ?Vitals Details ?Patient Name: [name], [name] ?Date of Service: 02/05/2022 11:00 AM ?Medical Record Number: [id number] ?Patient Account Number: [id number] ?Date of Birth/Sex: 05/21/1966 (56 y.o. M) ?Treating RN: [name] ?Primary Care Mischele Detter: [name] ?Other Clinician: Jacqulyn Bath ?Referring Deundre Thong: [name] ?Treating Jaqua Ching/Extender: Kalman Shan ?Weeks in Treatment: 13 ?Vital Signs ?Time Taken: 10:23 ?Temperature (??F): 98.7 ?Height (in): 70 ?Pulse (bpm): 72 ?Weight (lbs): 180.4 ?Respiratory Rate (breaths/min): 16 ?Body Mass Index (BMI): 25.9 ?Blood Pressure (mmHg): 128/74 ?Capillary Blood Glucose (mg/dl): 137 ?Reference Range: 80 - 120 mg / dl ?Electronic Signature(s) ?Signed: 02/05/2022 2:31:17 PM By: Enedina Finner RCP, RRT, CHT ?Entered By: Enedina Finner on 02/05/2022 11:24:22 ?

## 2022-02-05 NOTE — Progress Notes (Signed)
[place], [place] ([id number]) ?Visit Report for 02/04/2022 ?Problem List Details ?Patient Name: [name], [name] ?Date of Service: 02/04/2022 11:00 AM ?Medical Record Number: [id number] ?Patient Account Number: [id number] ?Date of Birth/Sex: 09-01-1966 (56 y.o. 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 ?Date of Service: 02/04/2022 ?Medical Record Number: [id number] ?Patient Account Number: [id number] ?Date of Birth/Sex: 09-06-1966 (56 y.o. 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 pectoris ?I50.42 Chronic combined systolic (congestive) and diastolic (congestive) heart failure ?R26.89 Other abnormalities of gait and mobility ?Facility Procedures ?CPT4 Code: 53664403 ?Description: (Facility Use Only) HBOT, full body chamber, 37min ?Modifier: ?Quantity: 4 ?Physician Procedures ?CPT4 Code: 4742595 ?Description: 99183 - WC PHYS HYPERBARIC OXYGEN THERAPY ?Modifier: ?Quantity: 1 ?CPT4 Code: ?Description: ICD-10 Diagnosis Description [phone] Other chronic osteomyelitis, left ankle and foot 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

## 2022-02-05 NOTE — Progress Notes (Addendum)
[place], [place] ([id number]) ?Visit Report for 02/04/2022 ?HBO Details ?Patient Name: [name], [name] ?Date of Service: 02/04/2022 11:00 AM ?Medical Record Number: [id number] ?Patient Account Number: [id number] ?Date of Birth/Sex: Aug 08, 1966 (56 y.o. 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 ?HBO Treatment Details ?Treatment Number: 50 ?Patient Type: Outpatient ?Chamber Type: Monoplace ?Chamber Serial #: [id number] ?Treatment Protocol: 2.0 ATA with 90 minutes oxygen, and no air breaks ?Treatment Details ?Compression Rate Down: 1.0 psi / minute ?De-Compression Rate Up: 1.5 psi / minute ?Compress Tx Pressure Air breaks and breathing periods Decompress Decompress ?Begins Reached (leave unused spaces blank) Begins Ends ?Chamber Pressure (ATA) 1 2 - - - - - - 2 1 ?Clock Time (24 hr) 10:31 10:48 - - - - - - 12:18 12:28 ?Treatment Length: 117 (minutes) ?Treatment Segments: 4 ?Vital Signs ?Capillary Blood Glucose Reference Range: 80 - 120 mg / dl ?HBO Diabetic Blood Glucose Intervention Range: <131 mg/dl or >249 mg/dl ?Time Vitals Blood Respiratory Capillary Blood Glucose Pulse Action ?Type: ?Pulse: Temperature: ?Taken: ?Pressure: ?Rate: ?Glucose (mg/dl): ?Meter #: Oximetry (%) Taken: ?Pre 10:12 122/72 60 16 98.2 186 1 non per protocol ?Post 12:32 132/78 66 16 98.7 136 1 none per protocol ?Treatment Response ?Treatment Toleration: Well ?Treatment Completion ?Treatment Completed without Adverse Event ?Status: ?HBO Attestation ?I certify that I supervised this HBO treatment in accordance with ?Medicare guidelines. A trained emergency response team is readily  Yes ?available per hospital policies and procedures. ?Continue HBOT as ordered. Yes ?Electronic Signature(s) ?Signed: 02/05/2022 8:23:34 AM By: Worthy Keeler PA-C ?Previous Signature: 02/04/2022 4:30:32 PM Version By: Enedina Finner RCP, RRT, CHT ?Previous Signature: 02/04/2022 5:00:17 PM Version By: Worthy Keeler PA-C ?Entered By: Worthy Keeler on 02/05/2022 08:23:33 ?[place], [place] ([id number]) ?-------------------------------------------------------------------------------- ?HBO Safety Checklist Details ?Patient Name: [name], [name] ?Date of Service: 02/04/2022 11:00 AM ?Medical Record Number: [id number] ?Patient Account Number: [id number] ?Date of Birth/Sex: [birth year] (56 y.o. M) ?Treating RN: [name] ?Primary Care Pearline Yerby: [name] ?Other Clinician: Jacqulyn Bath ?Referring Amadeus Oyama: [name] ?Treating Castulo Scarpelli/Extender: Jeri Cos ?Weeks in Treatment: 13 ?HBO Safety Checklist Items ?Safety Checklist ?Consent Form Signed ?Patient voided / foley secured and emptied ?When did you last eato 07:00 am ?Last dose of injectable or oral agent n/a ?Ostomy pouch emptied and vented if applicable ?NA ?All implantable devices assessed, documented and approved ?NA ?Intravenous access site secured and place ?NA ?Valuables secured ?Linens and cotton and cotton/polyester blend (less than 51% polyester) ?Personal oil-based products / skin lotions / body lotions removed ?Wigs or hairpieces removed ?NA ?Smoking or tobacco materials removed ?NA ?Books / newspapers / magazines / loose paper removed ?NA ?Cologne, aftershave, perfume and deodorant removed ?Jewelry removed (may wrap wedding band) ?Make-up removed ?NA ?Hair care products removed ?Battery operated devices (external) removed ?NA ?Heating patches and chemical warmers removed ?NA ?Titanium eyewear removed ?NA ?Nail polish cured greater than 10 hours ?NA ?Casting material cured greater than 10 hours ?NA ?Hearing aids removed ?NA ?Loose dentures  or partials removed ?NA ?Prosthetics have been removed ?NA ?Patient demonstrates correct use of air break device (if applicable) ?Patient concerns have been addressed ?Patient grounding bracelet on and cord attached to chamber ?Specifics for Inpatients (complete in addition  to ?above) ?Medication sheet sent with patient ?Intravenous medications needed or due during therapy sent with patient ?Drainage tubes (e.g. nasogastric tube or chest tube secured and vented) ?Endotracheal or Tracheotomy tube secured ?Cuff deflated of air and inflated with saline ?Airway suctioned ?Electronic Signature(s) ?Signed: 02/04/2022 4:30:32 PM By: Enedina Finner RCP, RRT, CHT ?Entered By: Enedina Finner on 02/04/2022 12:04:41 ?

## 2022-02-05 NOTE — Progress Notes (Signed)
[place], [place] ([id number]) ?Visit Report for 02/05/2022 ?HBO Details ?Patient Name: [name], [name] ?Date of Service: 02/05/2022 11:00 AM ?Medical Record Number: [id number] ?Patient Account Number: [id number] ?Date of Birth/Sex: [birth year] (56 y.o. 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 ?HBO Treatment Details ?Treatment Number: 60 ?Patient Type: Outpatient ?Chamber Type: Monoplace ?Chamber Serial #: [id number] ?Treatment Protocol: 2.0 ATA with 90 minutes oxygen, and no air breaks ?Treatment Details ?Compression Rate Down: 1.0 psi / minute ?De-Compression Rate Up: 1.5 psi / minute ?Compress Tx Pressure Air breaks and breathing periods Decompress Decompress ?Begins Reached (leave unused spaces blank) Begins Ends ?Chamber Pressure (ATA) 1 2 - - - - - - 2 1 ?Clock Time (24 hr) 11:06 11:22 - - - - - - 12:54 13:04 ?Treatment Length: 118 (minutes) ?Treatment Segments: 4 ?Vital Signs ?Capillary Blood Glucose Reference Range: 80 - 120 mg / dl ?HBO Diabetic Blood Glucose Intervention Range: <131 mg/dl or >249 mg/dl ?Time Vitals Blood Respiratory Capillary Blood Glucose Pulse Action ?Type: ?Pulse: Temperature: ?Taken: ?Pressure: ?Rate: ?Glucose (mg/dl): ?Meter #: Oximetry (%) Taken: ?Pre 10:23 128/74 72 16 98.7 137 1 none per protocol ?Post 13:09 128/72 72 16 98.3 135 1 none per protocol ?Treatment Response ?Treatment Toleration: Well ?Treatment Completion ?Treatment Completed without Adverse Event ?Status: ?HBO Attestation ?I certify that I supervised this HBO treatment in accordance with ?Medicare guidelines. A trained emergency response team is  readily Yes ?available per hospital policies and procedures. ?Continue HBOT as ordered. Yes ?Electronic Signature(s) ?Signed: 02/05/2022 4:30:07 PM By: Kalman Shan DO ?Previous Signature: 02/05/2022 2:31:17 PM Version By: Enedina Finner RCP, RRT, CHT ?Entered By: Kalman Shan on 02/05/2022 16:29:17 ?[place], [place] ([id number]) ?-------------------------------------------------------------------------------- ?HBO Safety Checklist Details ?Patient Name: [name], [name] ?Date of Service: 02/05/2022 11:00 AM ?Medical Record Number: [id number] ?Patient Account Number: [id number] ?Date of Birth/Sex: [birth year] (56 y.o. M) ?Treating RN: [name] ?Primary Care Rhealynn Myhre: [name] ?Other Clinician: Jacqulyn Bath ?Referring Digna Countess: [name] ?Treating Parry Po/Extender: Kalman Shan ?Weeks in Treatment: 13 ?HBO Safety Checklist Items ?Safety Checklist ?Consent Form Signed ?Patient voided / foley secured and emptied ?When did you last eato 10:00 am ?Last dose of injectable or oral agent n/a ?Ostomy pouch emptied and vented if applicable ?NA ?All implantable devices assessed, documented and approved ?NA ?Intravenous access site secured and place ?NA ?Valuables secured ?Linens and cotton and cotton/polyester blend (less than 51% polyester) ?Personal oil-based products / skin lotions / body lotions removed ?Wigs or hairpieces removed ?NA ?Smoking or tobacco materials removed ?NA ?Books / newspapers / magazines / loose paper removed ?NA ?Cologne, aftershave, perfume and deodorant removed ?Jewelry removed (may wrap wedding band) ?Make-up removed ?NA ?Hair care products removed ?Battery operated devices (external) removed ?NA ?Heating patches and chemical warmers removed ?NA ?Titanium eyewear removed ?NA ?Nail polish cured greater than 10 hours ?NA ?Casting material cured greater than 10 hours ?NA ?Hearing aids removed ?NA ?Loose dentures or partials removed ?NA ?Prosthetics have been  removed ?NA ?Patient demonstrates correct use of air break device (if applicable) ?Patient concerns have been addressed ?Patient grounding bracelet on and cord attached to chamber ?Specifics for Inpatients (complete in addition to ?above) ?Medication sheet sent with patient ?Intravenous medications needed or due during  therapy sent with patient ?Drainage tubes (e.g. nasogastric tube or chest tube secured and vented) ?Endotracheal or Tracheotomy tube secured ?Cuff deflated of air and inflated with saline ?Airway suctioned ?Electronic Signature(s) ?Signed: 02/05/2022 2:31:17 PM By: Enedina Finner RCP, RRT, CHT ?Entered By: Enedina Finner on 02/05/2022 11:25:44 ?

## 2022-02-06 ENCOUNTER — Encounter: Payer: Medicare Other | Admitting: Physician Assistant

## 2022-02-06 DIAGNOSIS — M86672 Other chronic osteomyelitis, left ankle and foot: Secondary | ICD-10-CM | POA: Diagnosis not present

## 2022-02-06 DIAGNOSIS — I5042 Chronic combined systolic (congestive) and diastolic (congestive) heart failure: Secondary | ICD-10-CM | POA: Diagnosis not present

## 2022-02-06 DIAGNOSIS — I10 Essential (primary) hypertension: Secondary | ICD-10-CM | POA: Diagnosis not present

## 2022-02-06 DIAGNOSIS — E11621 Type 2 diabetes mellitus with foot ulcer: Secondary | ICD-10-CM | POA: Diagnosis not present

## 2022-02-06 DIAGNOSIS — L97524 Non-pressure chronic ulcer of other part of left foot with necrosis of bone: Secondary | ICD-10-CM | POA: Diagnosis not present

## 2022-02-06 DIAGNOSIS — I2510 Atherosclerotic heart disease of native coronary artery without angina pectoris: Secondary | ICD-10-CM | POA: Diagnosis not present

## 2022-02-06 LAB — GLUCOSE, CAPILLARY
Glucose-Capillary: 185 mg/dL — ABNORMAL HIGH (ref 70–99)
Glucose-Capillary: 125 mg/dL — ABNORMAL HIGH (ref 70–99)

## 2022-02-06 NOTE — Progress Notes (Signed)
[name], [name] ([id number]) ?Visit Report for 02/06/2022 ?Arrival Information Details ?Patient Name: [name], [name] ?Date of Service: 02/06/2022 11:00 AM ?Medical Record Number: [id number] ?Patient Account Number: [id number] ?Date of Birth/Sex: [birth year] (56 y.o. M) ?Treating RN: [name] ?Primary Care Jojuan Champney: [name] ?Other Clinician: Jacqulyn Bath ?Referring Jacquelinne Speak: [name] ?Treating Rahsaan Weakland/Extender: Jeri Cos ?Weeks in Treatment: 14 ?Visit Information History Since Last Visit ?Added or deleted any medications: No ?Patient Arrived: Ambulatory ?Any [name] allergies or adverse reactions: No ?Arrival Time: 10:05 ?Had a fall or experienced change in No ?Accompanied By: self ?activities of daily living that may affect ?Transfer Assistance: None ?risk of [name]: ?Patient Identification Verified: Yes ?Signs or symptoms of abuse/neglect since last visito No ?Secondary Verification Process Completed: Yes ?Hospitalized since last visit: No ?Patient Requires Transmission-Based No ?Implantable device outside of the clinic excluding No ?Precautions: ?cellular tissue based products placed in the center ?Patient Has Alerts: Yes ?since last visit: ?Patient Alerts: Patient on Blood ?Pain Present Now: No ?Thinner ?DIALYSIS RIGHT ARM ?Aspirin 81/Brilinta ?Electronic Signature(s) ?Signed: 02/06/2022 1:35:01 PM By: Enedina Finner RCP, RRT, CHT ?Entered By: Enedina Finner on 02/06/2022 11:07:02 ?[name], [name] ([id number]) ?-------------------------------------------------------------------------------- ?Encounter Discharge Information Details ?Patient Name: [name], [name] ?Date of Service: 02/06/2022 11:00 AM ?Medical Record Number: [id number] ?Patient Account Number: [id number] ?Date of Birth/Sex: [birth year] (56 y.o. M) ?Treating RN: [name] ?Primary Care Zidan Helget: [name] ?Other Clinician: Jacqulyn Bath ?Referring Lace Chenevert: [name] ?Treating Geran Haithcock/Extender: Jeri Cos ?Weeks in Treatment: 14 ?Encounter Discharge Information Items ?Discharge Condition: Stable ?Ambulatory Status: Ambulatory ?Discharge Destination: Home ?Transportation: Private Auto ?Accompanied By: self ?Schedule Follow-up Appointment: Yes ?Clinical Summary of Care: ?Notes ?Patient has an HBO treatment scheduled on 02/07/22 at 11:00 am. ?Electronic Signature(s) ?Signed: 02/06/2022 1:35:01 PM By: Enedina Finner RCP, RRT, CHT ?Entered By: Enedina Finner on 02/06/2022 13:30:09 ?[name], [name] ([id number]) ?-------------------------------------------------------------------------------- ?Vitals Details ?Patient Name: [name], [name] ?Date of Service: 02/06/2022 11:00 AM ?Medical Record Number: [id number] ?Patient Account Number: [id number] ?Date of Birth/Sex: Apr 29, 1966 (56 y.o. M) ?Treating RN: [name] ?Primary Care Myishia Kasik: [name] ?Other Clinician: Jacqulyn Bath ?Referring Norlan Rann: [name] ?Treating Deari Sessler/Extender: Jeri Cos ?Weeks in Treatment: 14 ?Vital Signs ?Time Taken: 10:13 ?Temperature (??F): 98.1 ?Height (in): 70 ?Pulse (bpm): 72 ?Weight (lbs): 180.4 ?Respiratory Rate (breaths/min): 16 ?Body Mass Index (BMI): 25.9 ?Blood Pressure (mmHg): 122/70 ?Capillary Blood Glucose (mg/dl): 185 ?Reference Range: 80 - 120 mg / dl ?Electronic Signature(s) ?Signed: 02/06/2022 1:35:01 PM By: Enedina Finner RCP, RRT, CHT ?Entered By: Enedina Finner on 02/06/2022 11:07:49 ?

## 2022-02-07 ENCOUNTER — Encounter: Payer: Medicare Other | Admitting: Physician Assistant

## 2022-02-07 DIAGNOSIS — I5042 Chronic combined systolic (congestive) and diastolic (congestive) heart failure: Secondary | ICD-10-CM | POA: Diagnosis not present

## 2022-02-07 DIAGNOSIS — T8189XA Other complications of procedures, not elsewhere classified, initial encounter: Secondary | ICD-10-CM | POA: Diagnosis not present

## 2022-02-07 DIAGNOSIS — L97822 Non-pressure chronic ulcer of other part of left lower leg with fat layer exposed: Secondary | ICD-10-CM | POA: Diagnosis not present

## 2022-02-07 DIAGNOSIS — N2581 Secondary hyperparathyroidism of renal origin: Secondary | ICD-10-CM | POA: Diagnosis not present

## 2022-02-07 DIAGNOSIS — L97524 Non-pressure chronic ulcer of other part of left foot with necrosis of bone: Secondary | ICD-10-CM | POA: Diagnosis not present

## 2022-02-07 DIAGNOSIS — Z992 Dependence on renal dialysis: Secondary | ICD-10-CM | POA: Diagnosis not present

## 2022-02-07 DIAGNOSIS — I10 Essential (primary) hypertension: Secondary | ICD-10-CM | POA: Diagnosis not present

## 2022-02-07 DIAGNOSIS — D631 Anemia in chronic kidney disease: Secondary | ICD-10-CM | POA: Diagnosis not present

## 2022-02-07 DIAGNOSIS — I2510 Atherosclerotic heart disease of native coronary artery without angina pectoris: Secondary | ICD-10-CM | POA: Diagnosis not present

## 2022-02-07 DIAGNOSIS — N186 End stage renal disease: Secondary | ICD-10-CM | POA: Diagnosis not present

## 2022-02-07 DIAGNOSIS — L02612 Cutaneous abscess of left foot: Secondary | ICD-10-CM | POA: Diagnosis not present

## 2022-02-07 DIAGNOSIS — M86672 Other chronic osteomyelitis, left ankle and foot: Secondary | ICD-10-CM | POA: Diagnosis not present

## 2022-02-07 DIAGNOSIS — L97522 Non-pressure chronic ulcer of other part of left foot with fat layer exposed: Secondary | ICD-10-CM | POA: Diagnosis not present

## 2022-02-07 DIAGNOSIS — I96 Gangrene, not elsewhere classified: Secondary | ICD-10-CM | POA: Diagnosis not present

## 2022-02-07 DIAGNOSIS — E11621 Type 2 diabetes mellitus with foot ulcer: Secondary | ICD-10-CM | POA: Diagnosis not present

## 2022-02-07 LAB — GLUCOSE, CAPILLARY
Glucose-Capillary: 208 mg/dL — ABNORMAL HIGH (ref 70–99)
Glucose-Capillary: 149 mg/dL — ABNORMAL HIGH (ref 70–99)

## 2022-02-07 NOTE — Progress Notes (Signed)
[place], [place] ([id number]) ?Visit Report for 02/07/2022 ?Problem List Details ?Patient Name: [name], [name] ?Date of Service: 02/07/2022 11:00 AM ?Medical Record Number: [id number] ?Patient Account Number: [id number] ?Date of Birth/Sex: [birth year] (56 y.o. 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/31/2021 No Yes ?bone ?O35.009 Other chronic osteomyelitis, left ankle and foot 10/31/2021 No Yes ?I10 Essential (primary) hypertension 10/31/2021 No Yes ?I25.10 Atherosclerotic heart disease of native coronary artery without angina 10/31/2021 No Yes ?pectoris ?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 ?Date of Service: 02/07/2022 ?Medical Record Number: [id number] ?Patient Account Number: [id number] ?Date of Birth/Sex: 1966/05/12 (56 y.o. M) ?Treating RN: [name] ?Primary Care Provider: [name] ?Other Clinician: Jacqulyn Bath ?Referring Provider: [name] ?Treating Provider/Extender: Jeri Cos ?Weeks in Treatment: 14 ?Diagnosis Coding ?ICD-10 Codes ?Code Description ?E11.621 Type 2 diabetes mellitus with foot ulcer ?L97.524 Non-pressure chronic ulcer of other part of left foot  with necrosis of bone ?B01.751 Other chronic osteomyelitis, left ankle and foot ?I10 Essential (primary) hypertension ?I25.10 Atherosclerotic heart disease of native coronary artery without angina pectoris ?I50.42 Chronic combined systolic (congestive) and diastolic (congestive) heart failure ?R26.89 Other abnormalities of gait and mobility ?Facility Procedures ?CPT4 Code: 02585277 ?Description: (Facility Use Only) HBOT, full body chamber, 94min ?Modifier: ?Quantity: 4 ?Physician Procedures ?CPT4 Code: 8242353 ?Description: 99183 - WC PHYS HYPERBARIC OXYGEN THERAPY ?Modifier: ?Quantity: 1 ?CPT4 Code: ?Description: ICD-10 Diagnosis Description [phone] Other chronic osteomyelitis, left ankle and foot 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

## 2022-02-07 NOTE — Progress Notes (Signed)
Gal[place]o, [place] ([id number]) ?Visit Report for 02/07/2022 ?Arrival Information Details ?Patient Name: [name], [name] ?Date of Service: 02/07/2022 11:00 AM ?Medical Record Number: [id number] ?Patient Account Number: [id number] ?Date of Birth/Sex: [birth year] (56 y.o. M) ?Treating RN: [name] ?Primary Care Lukis Bunt: [name] ?Other Clinician: Jacqulyn Bath ?Referring Winford Hehn: [name] ?Treating Gavriela Cashin/Extender: Jeri Cos ?Weeks in Treatment: 14 ?Visit Information History Since Last Visit ?Added or deleted any medications: No ?Patient Arrived: Ambulatory ?Any new allergies or adverse reactions: No ?Arrival Time: 10:15 ?Had a fall or experienced change in No ?Accompanied By: self ?activities of daily living that may affect ?Transfer Assistance: None ?risk of falls: ?Patient Identification Verified: Yes ?Signs or symptoms of abuse/neglect since last visito No ?Secondary Verification Process Completed: Yes ?Hospitalized since last visit: No ?Patient Requires Transmission-Based No ?Implantable device outside of the clinic excluding No ?Precautions: ?cellular tissue based products placed in the center ?Patient Has Alerts: Yes ?since last visit: ?Patient Alerts: Patient on Blood ?Has Dressing in Place as Prescribed: Yes ?Thinner ?Has Footwear/Offloading in Place as Prescribed: Yes ?DIALYSIS RIGHT ARM ?Left: Other:Defender Boot ?Aspirin 81/Brilinta ?Pain Present Now: No ?Electronic Signature(s) ?Signed: 02/07/2022 1:46:07 PM By: Enedina Finner RCP, RRT, CHT ?Entered By: Enedina Finner on 02/07/2022 11:53:33 ?[place], [place] ([id number]) ?-------------------------------------------------------------------------------- ?Encounter Discharge Information Details ?Patient Name: [name], [name] ?Date of Service: 02/07/2022 11:00 AM ?Medical Record Number: [id number] ?Patient Account Number: [id number] ?Date of Birth/Sex: 05/03/1966 (56 y.o. M) ?Treating RN: [name] ?Primary Care Anisha Starliper: [name] ?Other Clinician: Jacqulyn Bath ?Referring Cleola Perryman: [name] ?Treating Saisha Hogue/Extender: Jeri Cos ?Weeks in Treatment: 14 ?Encounter Discharge Information Items ?Discharge Condition: Stable ?Ambulatory Status: Ambulatory ?Discharge Destination: Home ?Transportation: Private Auto ?Accompanied By: self ?Schedule Follow-up Appointment: Yes ?Clinical Summary of Care: ?Notes ?Patient has an HBO treatment scheduled on 02/10/22 at 11:00 am. ?Electronic Signature(s) ?Signed: 02/07/2022 1:46:07 PM By: Enedina Finner RCP, RRT, CHT ?Entered By: Enedina Finner on 02/07/2022 13:45:50 ?[place], [place] ([id number]) ?-------------------------------------------------------------------------------- ?Vitals Details ?Patient Name: [name], [name] ?Date of Service: 02/07/2022 11:00 AM ?Medical Record Number: [id number] ?Patient Account Number: [id number] ?Date of Birth/Sex: Apr 20, 1966 (56 y.o. M) ?Treating RN: [name] ?Primary Care Jamin Humphries: [name] ?Other Clinician: Jacqulyn Bath ?Referring Lazariah Savard: [name] ?Treating Satomi Buda/Extender: Jeri Cos ?Weeks in Treatment: 14 ?Vital Signs ?Time Taken: 10:17 ?Temperature (??F): 98.3 ?Height (in): 70 ?Pulse (bpm): 72 ?Weight (lbs): 180.4 ?Respiratory Rate (breaths/min): 16 ?Body Mass Index (BMI): 25.9 ?Blood Pressure (mmHg): 128/76 ?Capillary Blood Glucose (mg/dl): 208 ?Reference Range: 80 - 120 mg / dl ?Electronic Signature(s) ?Signed: 02/07/2022 1:46:07 PM By: Enedina Finner RCP, RRT, CHT ?Entered By: Enedina Finner on 02/07/2022 11:54:34 ?

## 2022-02-07 NOTE — Progress Notes (Addendum)
Lye, [place] ([id number]) ?Visit Report for 02/07/2022 ?Chief Complaint Document Details ?Patient Name: Sahr, Daimien L. ?Date of Service: 02/07/2022 1:15 PM ?Medical Record Number: [id number] ?Patient Account Number: [id number] ?Date of Birth/Sex: 04/11/1966 (56 y.o. M) ?Treating RN: [name] ?Primary Care Provider: [name] Other Clinician: ?Referring Provider: [name] ?Treating Provider/Extender: Jeri Cos ?Weeks in Treatment: 14 ?Information Obtained from: Patient ?Chief Complaint ?HBO evaluation for treatment on the left foot diabetic ulcer with osteomyelitis. Patient is currently under our care as well for his foot. ?Electronic Signature(s) ?Signed: 02/07/2022 1:17:31 PM By: Worthy Keeler PA-C ?Entered By: Worthy Keeler on 02/07/2022 13:17:30 ?[place], [place] ([id number]) ?-------------------------------------------------------------------------------- ?Debridement Details ?Patient Name: Cowdrey, Yossef L. ?Date of Service: 02/07/2022 1:15 PM ?Medical Record Number: [id number] ?Patient Account Number: [id number] ?Date of Birth/Sex: [birth year] (56 y.o. M) ?Treating RN: [name] ?Primary Care Provider: [name] Other Clinician: ?Referring Provider: [name] ?Treating Provider/Extender: Jeri Cos ?Weeks in Treatment: 14 ?Debridement Performed for ?Wound #2 Left,Lateral Foot ?Assessment: ?Performed By: Physician Tommie Sams., PA-C ?Debridement Type: Debridement ?Severity of Tissue Pre Debridement: Fat layer exposed ?Level of Consciousness (Pre- ?Awake and Alert ?procedure): ?Pre-procedure Verification/Time Out ?Yes - 13:22 ?Taken: ?Start Time: 13:22 ?Total Area Debrided (L x W): 4.5 (cm) x 2.5 (cm) = 11.25 (cm?) ?Tissue and other material ?Viable, Non-Viable, Callus, Eschar, Slough, Subcutaneous, Slough ?debrided: ?Level: Skin/Subcutaneous Tissue ?Debridement Description: Excisional ?Instrument: Curette ?Bleeding: Moderate ?Hemostasis Achieved: Pressure ?End Time: 13:30 ?Procedural  Pain: 0 ?Post Procedural Pain: 0 ?Response to Treatment: Procedure was tolerated well ?Level of Consciousness (Post- ?Awake and Alert ?procedure): ?Post Debridement Measurements of Total Wound ?Length: (cm) 4 ?Width: (cm) 2 ?Depth: (cm) 0.6 ?Volume: (cm?) 3.77 ?Character of Wound/Ulcer Post Debridement: Improved ?Severity of Tissue Post Debridement: Fat layer exposed ?Post Procedure Diagnosis ?Same as Pre-procedure ?Electronic Signature(s) ?Signed: 02/07/2022 4:04:50 PM By: [name] RN ?Signed: 02/07/2022 5:32:35 PM By: Worthy Keeler PA-C ?Entered By: [name] on 02/07/2022 13:26:55 ?Budzinski, [place] ([id number]) ?-------------------------------------------------------------------------------- ?Debridement Details ?Patient Name: Wordell, Zavon L. ?Date of Service: 02/07/2022 1:15 PM ?Medical Record Number: [id number] ?Patient Account Number: [id number] ?Date of Birth/Sex: [birth year] (56 y.o. M) ?Treating RN: [name] ?Primary Care Provider: [name] Other Clinician: ?Referring Provider: [name] ?Treating Provider/Extender: Jeri Cos ?Weeks in Treatment: 14 ?Debridement Performed for ?Wound #1 Left Amputation Site - Digit ?Assessment: ?Performed By: Physician Tommie Sams., PA-C ?Debridement Type: Debridement ?Level of Consciousness (Pre- ?Awake and Alert ?procedure): ?Pre-procedure Verification/Time Out ?Yes - 13:22 ?Taken: ?Start Time: 13:22 ?Total Area Debrided (L x W): 3 (cm) x 1.1 (cm) = 3.3 (cm?) ?Tissue and other material ?Viable, Non-Viable, Callus, Eschar, Slough, Subcutaneous, Slough ?debrided: ?Level: Skin/Subcutaneous Tissue ?Debridement Description: Excisional ?Instrument: Curette ?Bleeding: Moderate ?Hemostasis Achieved: Pressure ?End Time: 13:30 ?Procedural Pain: 0 ?Post Procedural Pain: 0 ?Response to Treatment: Procedure was tolerated well ?Level of Consciousness (Post- ?Awake and Alert ?procedure): ?Post Debridement Measurements of Total Wound ?Length: (cm) 3 ?Width: (cm)  1.1 ?Depth: (cm) 1 ?Volume: (cm?) 2.592 ?Character of Wound/Ulcer Post Debridement: Improved ?Post Procedure Diagnosis ?Same as Pre-procedure ?Electronic Signature(s) ?Signed: 02/07/2022 4:04:50 PM By: [name] RN ?Signed: 02/07/2022 5:32:35 PM By: Worthy Keeler PA-C ?Entered By: [name] on 02/07/2022 13:28:37 ?[place], [place] ([id number]) ?-------------------------------------------------------------------------------- ?HPI Details ?Patient Name: Coyne, Stonewall L. ?Date of Service: 02/07/2022 1:15 PM ?Medical Record Number: [id number] ?Patient Account Number: [id number] ?Date of Birth/Sex: 21-Jul-1966 (56 y.o. M) ?Treating RN: [name] ?Primary Care Provider: [name] Other Clinician: ?Referring Provider:  [name] ?Treating Provider/Extender: Jeri Cos ?Weeks in Treatment: 14 ?History of Present Illness ?HPI Description: 10/31/2021 patient presents today for evaluation here in our clinic. He is actually being evaluated for hyperbaric oxygen therapy ?only. He has been seen at [facility] up to this point he is under the care of the vascular surgery specialty. Subsequently he is also ?under the care of the hyperbaric center there. With that being said that due to the fact that he is a dialysis patient he was actually recommended ?to be transferred to [place] for his treatments he actually lives in [place] which is where his dialysis is. It was a hardship for him to try to get from ?[place] especially on dialysis days all the way to [facility] get into the facility for his treatment and get back home he was literally spending ?all day long. He has had just 4 treatments there at [facility] thus far based on what I see in these have not been continued while he awaits approval ?here in our clinic. Subsequently I do have records for review that will be included in the HPI predetermination review and attached to this chart as ?well. I will not duplicate that here. 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 Nonetheless he continues to have exposed bone with ?marked soft tissue swelling compatible with osteomyelitis. This is due to the irregularity of the head of the metatarsal as well. Nonetheless along ?with having associated cellulitis he is also good to be seen by infectious disease and is currently on antibiotics for this as well. Again that will be ?detailed in the pretreatment review section which I will attach to this note as well. He does currently have a wound VAC in place and again the ?wound was not evaluated here in the clinic as that is being completely managed by home health and [facility]. ?The patient does have a history of diabetes mellitus type 2, hypertension, coronary artery disease, and congestive heart failure. He also has ?cataracts of both eyes but no evidence of glaucoma on his most recent eye exam. ?11/22/2021 we have been seeing this gentleman for hyperbaric oxygen therapy but have not actually evaluated his wound up to this point this was ?being managed by the wound care and vascular team I presumed at [facility] at least that is what has been told to be previous. Nonetheless at this ?point I got a call from Atlee Abide who is a Librarian, academic at the [facility] vascular clinic with Dr. Durene Fruits and subsequently they were wanting ?to know if I could take over wound care for this patient. Apparently they do not have the ability for an office debridement which again I completely ?understand but nonetheless definitely is not an integral part of his healing along with the hyperbarics. Subsequently I discussed with the patient ?today that I do believe he would benefit going ahead and proceeding with evaluation here in the clinic for that reason I did go ahead and see him ?today to get things started. There is also been some confusion about  the issues with his home health nurse and getting measurements to [facility] for ?the wound VAC in order to continue his wound VAC therapy. With that being said after I see him today we will make a determination on what to ?

## 2022-02-07 NOTE — Progress Notes (Signed)
[name], [name] ([id number]) ?Visit Report for 02/06/2022 ?HBO Details ?Patient Name: [name], [name] ?Date of Service: 02/06/2022 11:00 AM ?Medical Record Number: [id number] ?Patient Account Number: [id number] ?Date of Birth/Sex: 05-May-1966 (56 y.o. 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 ?HBO Treatment Details ?Treatment Number: 54 ?Patient Type: Outpatient ?Chamber Type: Monoplace ?Chamber Serial #: [id number] ?Treatment Protocol: 2.0 ATA with 90 minutes oxygen, and no air breaks ?Treatment Details ?Compression Rate Down: 1.0 psi / minute ?De-Compression Rate Up: 1.5 psi / minute ?Compress Tx Pressure Air breaks and breathing periods Decompress Decompress ?Begins Reached (leave unused spaces blank) Begins Ends ?Chamber Pressure (ATA) 1 2 - - - - - - 2 1 ?Clock Time (24 hr) 10:46 11:02 - - - - - - 12:32 12:43 ?Treatment Length: 117 (minutes) ?Treatment Segments: 4 ?Vital Signs ?Capillary Blood Glucose Reference Range: 80 - 120 mg / dl ?HBO Diabetic Blood Glucose Intervention Range: <131 mg/dl or >249 mg/dl ?Time Vitals Blood Respiratory Capillary Blood Glucose Pulse Action ?Type: ?Pulse: Temperature: ?Taken: ?Pressure: ?Rate: ?Glucose (mg/dl): ?Meter #: Oximetry (%) Taken: ?Pre 10:13 122/70 72 16 98.1 185 1 none per protocol ?Post 12:47 124/72 72 16 98.7 125 1 none per protocol ?Treatment Response ?Treatment Toleration: Well ?Treatment Completion ?Treatment Completed without Adverse Event ?Status: ?HBO Attestation ?I certify that I supervised this HBO treatment in accordance with ?Medicare guidelines. A trained emergency response team is readily  Yes ?available per hospital policies and procedures. ?Continue HBOT as ordered. Yes ?Electronic Signature(s) ?Signed: 02/07/2022 5:28:40 PM By: Worthy Keeler PA-C ?Previous Signature: 02/06/2022 1:35:01 PM Version By: Enedina Finner RCP, RRT, CHT ?Entered By: Worthy Keeler on 02/07/2022 17:28:40 ?[name], [name] ([id number]) ?-------------------------------------------------------------------------------- ?HBO Safety Checklist Details ?Patient Name: [name], [name] ?Date of Service: 02/06/2022 11:00 AM ?Medical Record Number: [id number] ?Patient Account Number: [id number] ?Date of Birth/Sex: [birth year] (56 y.o. M) ?Treating RN: [name] ?Primary Care Twylla Arceneaux: [name] ?Other Clinician: Jacqulyn Bath ?Referring Kazimierz Springborn: [name] ?Treating Sumaiyah Markert/Extender: Jeri Cos ?Weeks in Treatment: 14 ?HBO Safety Checklist Items ?Safety Checklist ?Consent Form Signed ?Patient voided / foley secured and emptied ?When did you last eato 07:00 am ?Last dose of injectable or oral agent n/a ?Ostomy pouch emptied and vented if applicable ?NA ?All implantable devices assessed, documented and approved ?NA ?Intravenous access site secured and place ?NA ?Valuables secured ?Linens and cotton and cotton/polyester blend (less than 51% polyester) ?Personal oil-based products / skin lotions / body lotions removed ?Wigs or hairpieces removed ?NA ?Smoking or tobacco materials removed ?NA ?Books / newspapers / magazines / loose paper removed ?NA ?Cologne, aftershave, perfume and deodorant removed ?Jewelry removed (may wrap wedding band) ?Make-up removed ?Hair care products removed ?Battery operated devices (external) removed ?NA ?Heating patches and chemical warmers removed ?NA ?Titanium eyewear removed ?NA ?Nail polish cured greater than 10 hours ?NA ?Casting material cured greater than 10 hours ?NA ?Hearing aids removed ?NA ?Loose dentures or partials removed ?NA ?Prosthetics have been removed ?NA ?Patient  demonstrates correct use of air break device (if applicable) ?Patient concerns have been addressed ?Patient grounding bracelet on and cord attached to chamber ?Specifics for Inpatients (complete in addition to ?above) ?Medication sheet sent with patient ?Intravenous medications needed or due  during therapy sent with patient ?Drainage tubes (e.g. nasogastric tube or chest tube secured and vented) ?Endotracheal or Tracheotomy tube secured ?Cuff deflated of air and inflated with saline ?Airway suctioned ?Electronic Signature(s) ?Signed: 02/06/2022 1:35:01 PM By: Enedina Finner RCP, RRT, CHT ?Entered By: Enedina Finner on 02/06/2022 11:27:55 ?

## 2022-02-07 NOTE — Progress Notes (Signed)
Nicole, [place] ([id number]) ?Visit Report for 02/07/2022 ?HBO Details ?Patient Name: [name], [name] ?Date of Service: 02/07/2022 11:00 AM ?Medical Record Number: [id number] ?Patient Account Number: [id number] ?Date of Birth/Sex: [birth year] (56 y.o. 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 ?HBO Treatment Details ?Treatment Number: 107 ?Patient Type: Outpatient ?Chamber Type: Monoplace ?Chamber Serial #: [id number] ?Treatment Protocol: 2.0 ATA with 90 minutes oxygen, and no air breaks ?Treatment Details ?Compression Rate Down: 1.0 psi / minute ?De-Compression Rate Up: 1.5 psi / minute ?Compress Tx Pressure Air breaks and breathing periods Decompress Decompress ?Begins Reached (leave unused spaces blank) Begins Ends ?Chamber Pressure (ATA) 1 2 - - - - - - 2 1 ?Clock Time (24 hr) 10:47 11:04 - - - - - - 12:34 12:44 ?Treatment Length: 117 (minutes) ?Treatment Segments: 4 ?Vital Signs ?Capillary Blood Glucose Reference Range: 80 - 120 mg / dl ?HBO Diabetic Blood Glucose Intervention Range: <131 mg/dl or >249 mg/dl ?Time Vitals Blood Respiratory Capillary Blood Glucose Pulse Action ?Type: ?Pulse: Temperature: ?Taken: ?Pressure: ?Rate: ?Glucose (mg/dl): ?Meter #: Oximetry (%) Taken: ?Pre 10:17 128/76 72 16 98.3 208 1 none per protocol ?Post 12:49 128/76 66 16 98.3 149 1 none per protocol ?Treatment Response ?Treatment Toleration: Well ?Treatment Completion ?Treatment Completed without Adverse Event ?Status: ?HBO Attestation ?I certify that I supervised this HBO treatment in accordance with ?Medicare guidelines. A trained emergency response team is readily  Yes ?available per hospital policies and procedures. ?Continue HBOT as ordered. Yes ?Electronic Signature(s) ?Signed: 02/07/2022 5:30:58 PM By: Worthy Keeler PA-C ?Previous Signature: 02/07/2022 1:46:07 PM Version By: Enedina Finner RCP, RRT, CHT ?Entered By: Worthy Keeler on 02/07/2022 17:30:58 ?Co[place], [place] ([id number]) ?-------------------------------------------------------------------------------- ?HBO Safety Checklist Details ?Patient Name: [name], [name] ?Date of Service: 02/07/2022 11:00 AM ?Medical Record Number: [id number] ?Patient Account Number: [id number] ?Date of Birth/Sex: 1966-03-14 (57 y.o. M) ?Treating RN: [name] ?Primary Care Zechariah Bissonnette: [name] ?Other Clinician: Jacqulyn Bath ?Referring Hava Massingale: [name] ?Treating Wassim Kirksey/Extender: Jeri Cos ?Weeks in Treatment: 14 ?HBO Safety Checklist Items ?Safety Checklist ?Consent Form Signed ?Patient voided / foley secured and emptied ?When did you last eato 10:00 am ?Last dose of injectable or oral agent n/a ?Ostomy pouch emptied and vented if applicable ?NA ?All implantable devices assessed, documented and approved ?NA ?Intravenous access site secured and place ?NA ?Valuables secured ?Linens and cotton and cotton/polyester blend (less than 51% polyester) ?Personal oil-based products / skin lotions / body lotions removed ?Wigs or hairpieces removed ?NA ?Smoking or tobacco materials removed ?NA ?Books / newspapers / magazines / loose paper removed ?NA ?Cologne, aftershave, perfume and deodorant removed ?Jewelry removed (may wrap wedding band) ?Make-up removed ?NA ?Hair care products removed ?Battery operated devices (external) removed ?NA ?Heating patches and chemical warmers removed ?NA ?Titanium eyewear removed ?NA ?Nail polish cured greater than 10 hours ?NA ?Casting material cured greater than 10 hours ?NA ?Hearing aids removed ?NA ?Loose dentures or partials removed ?NA ?Prosthetics have been removed ?NA ?Patient  demonstrates correct use of air break device (if applicable) ?Patient concerns have been addressed ?Patient grounding bracelet on and cord attached to chamber ?Specifics for Inpatients (complete in addition to ?above) ?Medication sheet sent with patient ?Intravenous medications needed or  due during therapy sent with patient ?Drainage tubes (e.g. nasogastric tube or chest tube secured and vented) ?Endotracheal or Tracheotomy tube secured ?Cuff deflated of air and inflated with saline ?Airway suctioned ?Electronic Signature(s) ?Signed: 02/07/2022 1:46:07 PM By: Enedina Finner RCP, RRT, CHT ?Entered By: Enedina Finner on 02/07/2022 11:57:42 ?

## 2022-02-07 NOTE — Progress Notes (Signed)
Seidenberg, [place]y ([id number]) ?Visit Report for 02/06/2022 ?Problem List Details ?Patient Name: [name], [name] ?Date of Service: 02/06/2022 11:00 AM ?Medical Record Number: [id number] ?Patient Account Number: [id number] ?Date of Birth/Sex: 1965/11/24 (56 y.o. 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 ?Date of Service: 02/06/2022 ?Medical Record Number: [id number] ?Patient Account Number: [id number] ?Date of Birth/Sex: [birth year] (56 y.o. M) ?Treating RN: [name] ?Primary Care Provider: [name] ?Other Clinician: Jacqulyn Bath ?Referring Provider: [name] ?Treating Provider/Extender: Jeri Cos ?Weeks in Treatment: 14 ?Diagnosis Coding ?ICD-10 Codes ?Code Description ?E11.621 Type 2 diabetes mellitus with foot ulcer ?L97.524 Non-pressure chronic ulcer of other part of left foot  with necrosis of bone ?F35.456 Other chronic osteomyelitis, left ankle and foot ?I10 Essential (primary) hypertension ?I25.10 Atherosclerotic heart disease of native coronary artery without angina pectoris ?I50.42 Chronic combined systolic (congestive) and diastolic (congestive) heart failure ?R26.89 Other abnormalities of gait and mobility ?Facility Procedures ?CPT4 Code: 25638937 ?Description: (Facility Use Only) HBOT, full body chamber, 50min ?Modifier: ?Quantity: 4 ?Physician Procedures ?CPT4 Code: 3428768 ?Description: 99183 - WC PHYS HYPERBARIC OXYGEN THERAPY ?Modifier: ?Quantity: 1 ?CPT4 Code: ?Description: ICD-10 Diagnosis Description [phone] Other chronic osteomyelitis, left ankle and foot 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

## 2022-02-07 NOTE — Progress Notes (Signed)
[name], [name] ([id number]) ?Visit Report for 02/07/2022 ?Arrival Information Details ?Patient Name: [name], [name] ?Date of Service: 02/07/2022 1:15 PM ?Medical Record Number: [id number] ?Patient Account Number: [id number] ?Date of Birth/Sex: [birth year] (56 y.o. M) ?Treating RN: [name] ?Primary Care Zhion Pevehouse: [name] Other Clinician: ?Referring Shirley Bolle: [name] ?Treating Jove Beyl/Extender: Jeri Cos ?Weeks in Treatment: 14 ?Visit Information History Since Last Visit ?All ordered tests and consults were completed: No ?Patient Arrived: Ambulatory ?Added or deleted any medications: No ?Arrival Time: 12:58 ?Any new allergies or adverse reactions: No ?Accompanied By: self ?Had a fall or experienced change in No ?Transfer Assistance: None ?activities of daily living that may affect ?Patient Identification Verified: Yes ?risk of falls: ?Secondary Verification Process Completed: Yes ?Signs or symptoms of abuse/neglect since last visito No ?Patient Requires Transmission-Based No ?Hospitalized since last visit: No ?Precautions: ?Implantable device outside of the clinic excluding No ?Patient Has Alerts: Yes ?cellular tissue based products placed in the center ?Patient Alerts: Patient on Blood ?since last visit: ?Thinner ?Has Dressing in Place as Prescribed: Yes ?DIALYSIS RIGHT ARM ?Has Footwear/Offloading in Place as Prescribed: Yes ?Aspirin 81/Brilinta ?Left: Other:foot defender ?Pain Present Now: No ?Electronic Signature(s) ?Signed: 02/07/2022 4:04:50 PM By: [name] RN ?Entered By: [name] on 02/07/2022 12:58:53 ?[name], [name] ([id number]) ?-------------------------------------------------------------------------------- ?Clinic Level of Care Assessment Details ?Patient Name: [name], [name] ?Date of Service: 02/07/2022 1:15 PM ?Medical Record Number: [id number] ?Patient Account Number: [id number] ?Date of Birth/Sex: September 27, 1966 (56 y.o. M) ?Treating RN: [name] ?Primary Care Lonita Debes: [name] Other Clinician: ?Referring Shaasia Odle: [name] ?Treating Alesha Jaffee/Extender: Jeri Cos ?Weeks in Treatment: 14 ?Clinic Level of Care Assessment Items ?TOOL 1 Quantity Score ?[]  - Use when EandM and Procedure is performed on INITIAL visit 0 ?ASSESSMENTS - Nursing Assessment / Reassessment ?[]  - General Physical Exam (combine w/ comprehensive assessment (listed just below) when performed on new ?0 ?pt. evals) ?[]  - 0 ?Comprehensive Assessment (HX, ROS, Risk Assessments, Wounds Hx, etc.) ?ASSESSMENTS - Wound and Skin Assessment / Reassessment ?[]  - Dermatologic / Skin Assessment (not related to wound area) 0 ?ASSESSMENTS - Ostomy and/or Continence Assessment and Care ?[]  - Incontinence Assessment and Management 0 ?[]  - 0 ?Ostomy Care Assessment and Management (repouching, etc.) ?PROCESS - Coordination of Care ?[]  - Simple Patient / Family Education for ongoing care 0 ?[]  - 0 ?Complex (extensive) Patient / Family Education for ongoing care ?[]  - 0 ?Staff obtains Consents, Records, Test Results / Process Orders ?[]  - 0 ?Staff telephones HHA, Nursing Homes / Clarify orders / etc ?[]  - 0 ?Routine Transfer to another Facility (non-emergent condition) ?[]  - 0 ?Routine Hospital Admission (non-emergent condition) ?[]  - 0 ?New Admissions / Biomedical engineer / Ordering NPWT, Apligraf, etc. ?[]  - 0 ?Emergency Hospital Admission (emergent condition) ?PROCESS - Special Needs ?[]  - Pediatric / Minor Patient Management 0 ?[]  - 0 ?Isolation Patient Management ?[]  - 0 ?Hearing / Language / Visual special needs ?[]  - 0 ?Assessment of Community assistance (transportation, D/C planning, etc.) ?[]  - 0 ?Additional assistance / Altered mentation ?[]  - 0 ?Support Surface(s) Assessment (bed, cushion, seat, etc.) ?INTERVENTIONS - Miscellaneous ?[]  - External ear exam 0 ?[]  - 0 ?Patient Transfer (multiple staff / Civil Service fast streamer / Similar devices) ?[]  - 0 ?Simple Staple / Suture removal (25 or less) ?[]  - 0 ?Complex Staple /  Suture removal (26 or more) ?[]  - 0 ?Hypo/Hyperglycemic Management (do not check if billed separately) ?[]  - 0 ?Ankle / Brachial Index (ABI) - do not check  if billed separately ?Has the patient been seen at the hospital within the last three years: Yes ?Total Score: 0 ?Level Of Care: ____ ?[name], [name] ([id number]) ?Electronic Signature(s) ?Signed: 02/07/2022 4:04:50 PM By: [name] RN ?Entered By: [name] on 02/07/2022 13:29:51 ?[name], [name] ([id number]) ?-------------------------------------------------------------------------------- ?Encounter Discharge Information Details ?Patient Name: [name], [name] ?Date of Service: 02/07/2022 1:15 PM ?Medical Record Number: [id number] ?Patient Account Number: [id number] ?Date of Birth/Sex: December 07, 1965 (56 y.o. M) ?Treating RN: [name] ?Primary Care Tiffany Calmes: [name] Other Clinician: ?Referring Bryceton Hantz: [name] ?Treating Arsenio Schnorr/Extender: Jeri Cos ?Weeks in Treatment: 14 ?Encounter Discharge Information Items Post Procedure Vitals ?Discharge Condition: Stable ?Temperature (?F): 98.3 ?Ambulatory Status: Ambulatory ?Pulse (bpm): 66 ?Discharge Destination: Home ?Respiratory Rate (breaths/min): 16 ?Transportation: Private Auto ?Blood Pressure (mmHg): 128/76 ?Accompanied By: self ?Schedule Follow-up Appointment: Yes ?Clinical Summary of Care: Patient Declined ?Electronic Signature(s) ?Signed: 02/07/2022 4:04:50 PM By: [name] RN ?Entered By: [name] on 02/07/2022 13:35:21 ?[name], [name] ([id number]) ?-------------------------------------------------------------------------------- ?Lower Extremity Assessment Details ?Patient Name: [name], [name] ?Date of Service: 02/07/2022 1:15 PM ?Medical Record Number: [id number] ?Patient Account Number: [id number] ?Date of Birth/Sex: 23-Jun-1966 (56 y.o. M) ?Treating RN: [name] ?Primary Care Maddelyn Rocca: [name] Other Clinician: ?Referring Torrie Lafavor: [name] ?Treating Monifa Blanchette/Extender: Jeri Cos ?Weeks in Treatment: 14 ?Electronic Signature(s) ?Signed: 02/07/2022 4:04:50 PM By: [name] RN ?Entered By: [name] on 02/07/2022 13:12:07 ?[name], [name] ([id number]) ?-------------------------------------------------------------------------------- ?Multi Wound Chart Details ?Patient Name: [name], [name] ?Date of Service: 02/07/2022 1:15 PM ?Medical Record Number: [id number] ?Patient Account Number: [id number] ?Date of Birth/Sex: 03-02-1966 (56 y.o. M) ?Treating RN: [name] ?Primary Care Cary Lothrop: [name] Other Clinician: ?Referring Trell Secrist: [name] ?Treating Murle Otting/Extender: Jeri Cos ?Weeks in Treatment: 14 ?Photos: [2:No Photos] [N/A:N/A] ?Wound Location: Left Amputation Site - Digit Left, Lateral Foot N/A ?Wounding Event: Surgical Injury Blister N/A ?Primary Etiology: Open Surgical Wound Diabetic Wound/Ulcer of the Lower N/A ?Extremity ?Comorbid History: Cataracts, Anemia, Sleep Apnea, Cataracts, Anemia, Sleep Apnea, N/A ?Arrhythmia, Congestive Heart Arrhythmia, Congestive Heart ?Failure, Coronary Artery Disease, Failure, Coronary Artery Disease, ?Hypertension, Myocardial Infarction, Hypertension, Myocardial Infarction, ?Type II Diabetes, Neuropathy, Type II Diabetes, Neuropathy, ?Seizure Disorder Seizure Disorder ?Date Acquired: 09/09/2021 10/28/2021 N/A ?Weeks of Treatment: 11 10 N/A ?Wound Status: Open Open N/A ?Wound Recurrence: No No N/A ?Measurements L x W x D (cm) 3x1.1x1 4x2x0.6 N/A ?Area (cm?) : 2.592 6.283 N/A ?Volume (cm?) : 2.592 3.77 N/A ?% Reduction in Area: 81.70% -100.00% N/A ?% Reduction in Volume: 93.70% -1100.60% N/A ?Classification: Full Thickness With Exposed Grade 2 N/A ?Support Structures ?Exudate Amount: Medium Medium N/A ?Exudate Type: Serous Serosanguineous N/A ?Exudate Color: amber red, brown N/A ?Granulation Amount: Medium (34-66%) Small (1-33%) N/A ?Granulation Quality: Red, Pink Red N/A ?Necrotic Amount: Medium (34-66%) Large (67-100%) N/A ?Exposed  Structures: ?Fat Layer (Subcutaneous Tissue): Fat Layer (Subcutaneous Tissue): N/A ?Yes Yes ?Fascia: No ?Fascia: No ?Tendon: No ?Tendon: No ?Muscle: No ?Muscle: No ?Joint: No ?Joint: No ?Bone: No ?Bone: No ?E

## 2022-02-10 ENCOUNTER — Encounter: Payer: Medicare Other | Admitting: Physician Assistant

## 2022-02-10 DIAGNOSIS — I10 Essential (primary) hypertension: Secondary | ICD-10-CM | POA: Diagnosis not present

## 2022-02-10 DIAGNOSIS — Z992 Dependence on renal dialysis: Secondary | ICD-10-CM | POA: Diagnosis not present

## 2022-02-10 DIAGNOSIS — L97524 Non-pressure chronic ulcer of other part of left foot with necrosis of bone: Secondary | ICD-10-CM | POA: Diagnosis not present

## 2022-02-10 DIAGNOSIS — E11621 Type 2 diabetes mellitus with foot ulcer: Secondary | ICD-10-CM | POA: Diagnosis not present

## 2022-02-10 DIAGNOSIS — N186 End stage renal disease: Secondary | ICD-10-CM | POA: Diagnosis not present

## 2022-02-10 DIAGNOSIS — I5042 Chronic combined systolic (congestive) and diastolic (congestive) heart failure: Secondary | ICD-10-CM | POA: Diagnosis not present

## 2022-02-10 DIAGNOSIS — I96 Gangrene, not elsewhere classified: Secondary | ICD-10-CM | POA: Diagnosis not present

## 2022-02-10 DIAGNOSIS — I2510 Atherosclerotic heart disease of native coronary artery without angina pectoris: Secondary | ICD-10-CM | POA: Diagnosis not present

## 2022-02-10 DIAGNOSIS — L02612 Cutaneous abscess of left foot: Secondary | ICD-10-CM | POA: Diagnosis not present

## 2022-02-10 DIAGNOSIS — N2581 Secondary hyperparathyroidism of renal origin: Secondary | ICD-10-CM | POA: Diagnosis not present

## 2022-02-10 DIAGNOSIS — M86672 Other chronic osteomyelitis, left ankle and foot: Secondary | ICD-10-CM | POA: Diagnosis not present

## 2022-02-10 DIAGNOSIS — Z5181 Encounter for therapeutic drug level monitoring: Secondary | ICD-10-CM | POA: Diagnosis not present

## 2022-02-10 DIAGNOSIS — D631 Anemia in chronic kidney disease: Secondary | ICD-10-CM | POA: Diagnosis not present

## 2022-02-10 LAB — GLUCOSE, CAPILLARY
Glucose-Capillary: 92 mg/dL (ref 70–99)
Glucose-Capillary: 124 mg/dL — ABNORMAL HIGH (ref 70–99)

## 2022-02-10 NOTE — Progress Notes (Signed)
O[place]um, [place] ([id number]) ?Visit Report for 02/10/2022 ?HBO Details ?Patient Name: [name], [name] ?Date of Service: 02/10/2022 11:00 AM ?Medical Record Number: [id number] ?Patient Account Number: [id number] ?Date of Birth/Sex: [birth year] (56 y.o. 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Capillary Blood Glucose Reference Range: 80 - 120 mg / dl ?HBO Diabetic Blood Glucose Intervention Range: <131 mg/dl or >249 mg/dl ?Time Vitals Blood Respiratory Capillary Blood Glucose Pulse Action ?Type: ?Pulse: Temperature: ?Taken: ?Pressure: ?Rate: ?Glucose (mg/dl): ?Meter #: Oximetry (%) Taken: ?Pre 10:04 128/78 78 16 98.1 92 1 patient just ate ?Post 12:49 132/78 72 16 98.1 124 1 none per protocol ?Treatment Response ?Treatment Toleration: Well ?Treatment Completion ?Treatment Completed without Adverse Event ?Status: ?Electronic Signature(s) ?Signed: 02/10/2022 2:00:03 PM By: Enedina Finner RCP, RRT, CHT ?Signed: 02/10/2022 5:11:41 PM By: Worthy Keeler  PA-C ?Entered By: Enedina Finner on 02/10/2022 13:56:54 ?Dimitrov, [place] ([id number]) ?-------------------------------------------------------------------------------- ?HBO Safety Checklist Details ?Patient Name: [name], [name] ?Date of Service: 02/10/2022 11:00 AM ?Medical Record Number: [id number] ?Patient Account Number: [id number] ?Date of Birth/Sex: 28-Jan-1966 (56 y.o. M) ?Treating RN: [name] ?Primary Care Arlee Bossard: [name] ?Other Clinician: Jacqulyn Bath ?Referring Aritzel Krusemark: [name] ?Treating Jodilyn Giese/Extender: Jeri Cos ?Weeks in Treatment: 14 ?HBO Safety Checklist Items ?Safety Checklist ?Consent Form Signed ?Patient voided / foley secured and emptied ?When did you last eato 09:30 am ?Last dose of injectable or oral agent n/a ?Ostomy pouch emptied and vented if applicable ?NA ?All implantable devices assessed, documented and approved ?NA ?Intravenous access site secured and place ?NA ?Valuables secured ?Linens and cotton and cotton/polyester blend (less than 51% polyester) ?Personal oil-based products / skin lotions / body lotions removed ?Wigs or hairpieces removed ?NA ?Smoking or tobacco materials removed ?NA ?Books / newspapers / magazines / loose paper removed ?NA ?Cologne, aftershave, perfume and deodorant removed ?Jewelry removed (may wrap wedding band) ?Make-up removed ?NA ?Hair care products removed ?Battery operated devices (external) removed ?NA ?Heating patches and chemical warmers removed ?NA ?Titanium eyewear removed ?NA ?Nail polish cured greater than 10 hours ?NA ?Casting material cured greater than 10 hours ?NA ?Hearing aids removed ?NA ?Loose dentures or partials removed ?NA ?Prosthetics have been removed ?NA ?Patient demonstrates correct use of air break device (if applicable) ?Patient concerns have been addressed ?Patient grounding bracelet on and cord attached to chamber ?Specifics for Inpatients (complete in addition to ?above) ?Medication sheet sent with  patient ?Intravenous medications needed or due during therapy sent with patient ?Drainage tubes (e.g. nasogastric tube or chest tube secured and vented) ?Endotracheal or Tracheotomy tube secured ?Cuff deflated of air and inflated with saline ?Airway suctioned ?Electronic Signature(s) ?Signed: 02/10/2022  2:00:03 PM By: Enedina Finner RCP, RRT, CHT ?Entered By: Enedina Finner on 02/10/2022 11:06:41 ?

## 2022-02-10 NOTE — Progress Notes (Signed)
Wiebelhaus, [place] ([id number]) ?Visit Report for 02/10/2022 ?Arrival Information Details ?Patient Name: [name], [name] ?Date of Service: 02/10/2022 11:00 AM ?Medical Record Number: [id number] ?Patient Account Number: [id number] ?Date of Birth/Sex: 08/14/1966 (56 y.o. M) ?Treating RN: [name] ?Primary Care Dustine Stickler: [name] ?Other Clinician: Jacqulyn Bath ?Referring Merced Hanners: [name] ?Treating Jalon Squier/Extender: Jeri Cos ?Weeks in Treatment: 14 ?Visit Information History Since Last Visit ?Added or deleted any medications: No ?Patient Arrived: Ambulatory ?Any new allergies or adverse reactions: No ?Arrival Time: 10:00 ?Had a fall or experienced change in No ?Accompanied By: self ?activities of daily living that may affect ?Transfer Assistance: None ?risk of falls: ?Patient Identification Verified: Yes ?Signs or symptoms of abuse/neglect since last visito No ?Secondary Verification Process Completed: Yes ?Hospitalized since last visit: No ?Patient Requires Transmission-Based No ?Implantable device outside of the clinic excluding No ?Precautions: ?cellular tissue based products placed in the center ?Patient Has Alerts: Yes ?since last visit: ?Patient Alerts: Patient on Blood ?Has Dressing in Place as Prescribed: Yes ?Thinner ?Has Footwear/Offloading in Place as Prescribed: Yes ?DIALYSIS RIGHT ARM ?Left: Other:Defender Boot ?Aspirin 81/Brilinta ?Pain Present Now: No ?Electronic Signature(s) ?Signed: 02/10/2022 2:00:03 PM By: Enedina Finner RCP, RRT, CHT ?Entered By: Enedina Finner on 02/10/2022 10:15:37 ?[name], [name] ([id number]) ?-------------------------------------------------------------------------------- ?Encounter Discharge Information Details ?Patient Name: [name], [name] ?Date of Service: 02/10/2022 11:00 AM ?Medical Record Number: [id number] ?Patient Account Number: [id number] ?Date of Birth/Sex: 1965-11-19 (56 y.o. M) ?Treating RN: [name] ?Primary Care Luian Schumpert: [name] ?Other Clinician: Jacqulyn Bath ?Referring Colin [name]: [name] ?Treating Johnjoseph Rolfe/Extender: Jeri Cos ?Weeks in Treatment: 14 ?Encounter Discharge Information Items ?Discharge Condition: Stable ?Ambulatory Status: Ambulatory ?Discharge Destination: Home ?Transportation: Private Auto ?Accompanied By: self ?Schedule Follow-up Appointment: Yes ?Clinical Summary of Care: ?Notes ?Patient has an HBO treatment scheduled on 02/11/22 at 11:00 am. ?Electronic Signature(s) ?Signed: 02/10/2022 2:00:03 PM By: Enedina Finner RCP, RRT, CHT ?Entered By: Enedina Finner on 02/10/2022 13:57:44 ?[name], [name] ([id number]) ?-------------------------------------------------------------------------------- ?Vitals Details ?Patient Name: [name], [name] ?Date of Service: 02/10/2022 11:00 AM ?Medical Record Number: [id number] ?Patient Account Number: [id number] ?Date of Birth/Sex: 27-Dec-1965 (56 y.o. M) ?Treating RN: [name] ?Primary Care Aadhya Bustamante: [name] ?Other Clinician: Jacqulyn Bath ?Referring Anberlin Diez: [name] ?Treating Amaris Garrette/Extender: Jeri Cos ?Weeks in Treatment: 14 ?Vital Signs ?Time Taken: 10:04 ?Temperature (??F): 98.1 ?Height (in): 70 ?Pulse (bpm): 78 ?Weight (lbs): 180.4 ?Respiratory Rate (breaths/min): 16 ?Body Mass Index (BMI): 25.9 ?Blood Pressure (mmHg): 128/78 ?Capillary Blood Glucose (mg/dl): 92 ?Reference Range: 80 - 120 mg / dl ?Electronic Signature(s) ?Signed: 02/10/2022 2:00:03 PM By: Enedina Finner RCP, RRT, CHT ?Entered By: Enedina Finner on 02/10/2022 10:16:11 ?

## 2022-02-11 ENCOUNTER — Encounter: Payer: Medicare Other | Admitting: Physician Assistant

## 2022-02-11 DIAGNOSIS — E1122 Type 2 diabetes mellitus with diabetic chronic kidney disease: Secondary | ICD-10-CM | POA: Diagnosis not present

## 2022-02-11 DIAGNOSIS — M86672 Other chronic osteomyelitis, left ankle and foot: Secondary | ICD-10-CM | POA: Diagnosis not present

## 2022-02-11 DIAGNOSIS — Z4781 Encounter for orthopedic aftercare following surgical amputation: Secondary | ICD-10-CM | POA: Diagnosis not present

## 2022-02-11 DIAGNOSIS — E1151 Type 2 diabetes mellitus with diabetic peripheral angiopathy without gangrene: Secondary | ICD-10-CM | POA: Diagnosis not present

## 2022-02-11 DIAGNOSIS — I5042 Chronic combined systolic (congestive) and diastolic (congestive) heart failure: Secondary | ICD-10-CM | POA: Diagnosis not present

## 2022-02-11 DIAGNOSIS — D631 Anemia in chronic kidney disease: Secondary | ICD-10-CM | POA: Diagnosis not present

## 2022-02-11 DIAGNOSIS — E11621 Type 2 diabetes mellitus with foot ulcer: Secondary | ICD-10-CM | POA: Diagnosis not present

## 2022-02-11 DIAGNOSIS — L97524 Non-pressure chronic ulcer of other part of left foot with necrosis of bone: Secondary | ICD-10-CM | POA: Diagnosis not present

## 2022-02-11 DIAGNOSIS — N186 End stage renal disease: Secondary | ICD-10-CM | POA: Diagnosis not present

## 2022-02-11 DIAGNOSIS — I120 Hypertensive chronic kidney disease with stage 5 chronic kidney disease or end stage renal disease: Secondary | ICD-10-CM | POA: Diagnosis not present

## 2022-02-11 DIAGNOSIS — I2510 Atherosclerotic heart disease of native coronary artery without angina pectoris: Secondary | ICD-10-CM | POA: Diagnosis not present

## 2022-02-11 DIAGNOSIS — I10 Essential (primary) hypertension: Secondary | ICD-10-CM | POA: Diagnosis not present

## 2022-02-11 LAB — GLUCOSE, CAPILLARY
Glucose-Capillary: 218 mg/dL — ABNORMAL HIGH (ref 70–99)
Glucose-Capillary: 99 mg/dL (ref 70–99)

## 2022-02-12 ENCOUNTER — Encounter (HOSPITAL_BASED_OUTPATIENT_CLINIC_OR_DEPARTMENT_OTHER): Payer: Medicare Other | Admitting: Internal Medicine

## 2022-02-12 DIAGNOSIS — E11621 Type 2 diabetes mellitus with foot ulcer: Secondary | ICD-10-CM

## 2022-02-12 DIAGNOSIS — I10 Essential (primary) hypertension: Secondary | ICD-10-CM | POA: Diagnosis not present

## 2022-02-12 DIAGNOSIS — N2581 Secondary hyperparathyroidism of renal origin: Secondary | ICD-10-CM | POA: Diagnosis not present

## 2022-02-12 DIAGNOSIS — M86672 Other chronic osteomyelitis, left ankle and foot: Secondary | ICD-10-CM

## 2022-02-12 DIAGNOSIS — N186 End stage renal disease: Secondary | ICD-10-CM | POA: Diagnosis not present

## 2022-02-12 DIAGNOSIS — I2510 Atherosclerotic heart disease of native coronary artery without angina pectoris: Secondary | ICD-10-CM | POA: Diagnosis not present

## 2022-02-12 DIAGNOSIS — D631 Anemia in chronic kidney disease: Secondary | ICD-10-CM | POA: Diagnosis not present

## 2022-02-12 DIAGNOSIS — I96 Gangrene, not elsewhere classified: Secondary | ICD-10-CM | POA: Diagnosis not present

## 2022-02-12 DIAGNOSIS — L02612 Cutaneous abscess of left foot: Secondary | ICD-10-CM | POA: Diagnosis not present

## 2022-02-12 DIAGNOSIS — I5042 Chronic combined systolic (congestive) and diastolic (congestive) heart failure: Secondary | ICD-10-CM | POA: Diagnosis not present

## 2022-02-12 DIAGNOSIS — L97524 Non-pressure chronic ulcer of other part of left foot with necrosis of bone: Secondary | ICD-10-CM

## 2022-02-12 DIAGNOSIS — Z992 Dependence on renal dialysis: Secondary | ICD-10-CM | POA: Diagnosis not present

## 2022-02-12 LAB — GLUCOSE, CAPILLARY
Glucose-Capillary: 111 mg/dL — ABNORMAL HIGH (ref 70–99)
Glucose-Capillary: 136 mg/dL — ABNORMAL HIGH (ref 70–99)

## 2022-02-12 NOTE — Progress Notes (Signed)
[place]igh, [place] ([id number]) ?Visit Report for 02/11/2022 ?HBO Details ?Patient Name: [name], [name] ?Date of Service: 02/11/2022 11:00 AM ?Medical Record Number: [id number] ?Patient Account Number: [id number] ?Date of Birth/Sex: 12-12-1965 (56 y.o. 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 ?HBO Treatment Details ?Treatment Number: 23 ?Patient Type: Outpatient ?Chamber Type: Monoplace ?Chamber Serial #: [id number] ?Treatment Protocol: 2.0 ATA with 90 minutes oxygen, and no air breaks ?Treatment Details ?Compression Rate Down: 1.0 psi / minute ?De-Compression Rate Up: 1.5 psi / minute ?Compress Tx Pressure Air breaks and breathing periods Decompress Decompress ?Begins Reached (leave unused spaces blank) Begins Ends ?Chamber Pressure (ATA) 1 2 - - - - - - 2 1 ?Clock Time (24 hr) 10:49 11:06 - - - - - - 12:36 12:47 ?Treatment Length: 118 (minutes) ?Treatment Segments: 4 ?Vital Signs ?Capillary Blood Glucose Reference Range: 80 - 120 mg / dl ?HBO Diabetic Blood Glucose Intervention Range: <131 mg/dl or >249 mg/dl ?Time Capillary Blood Glucose Pulse ?Blood Respiratory Action ?Type: Vitals ?Pulse: ?Temperature: Glucose Meter Oximetry ?Pressure: ?Rate: Taken: ?Taken: ?(mg/dl): ?#: (%) ?Pre 09:59 130/78 72 16 97.7 218 1 none per protocol ?no action taken; patient feels ?Post 12:53 126/70 66 16 97.9 99 1 ?great ?Treatment Response ?Treatment Toleration: Well ?Treatment Completion ?Treatment Completed without Adverse Event ?Status: ?HBO Attestation ?I certify that I supervised this HBO treatment in accordance with ?Medicare guidelines. A trained emergency  response team is readily Yes ?available per hospital policies and procedures. ?Continue HBOT as ordered. Yes ?Electronic Signature(s) ?Signed: 02/11/2022 6:06:48 PM By: Worthy Keeler PA-C ?Previous Signature: 02/11/2022 2:10:43 PM Version By: Enedina Finner RCP, RRT, CHT ?Entered By: Worthy Keeler on 02/11/2022 18:06:48 ?[place], [place] ([id number]) ?-------------------------------------------------------------------------------- ?HBO Safety Checklist Details ?Patient Name: [name], [name] ?Date of Service: 02/11/2022 11:00 AM ?Medical Record Number: [id number] ?Patient Account Number: [id number] ?Date of Birth/Sex: Dec 19, 1965 (56 y.o. M) ?Treating RN: [name] ?Primary Care Mena Lienau: [name] ?Other Clinician: Jacqulyn Bath ?Referring Merrillyn Ackerley: [name] ?Treating Jasmeet Gehl/Extender: Jeri Cos ?Weeks in Treatment: 14 ?HBO Safety Checklist Items ?Safety Checklist ?Consent Form Signed ?Patient voided / foley secured and emptied ?When did you last eato 09:30 am ?Last dose of injectable or oral agent n/a ?Ostomy pouch emptied and vented if applicable ?NA ?All implantable devices assessed, documented and approved ?NA ?Intravenous access site secured and place ?NA ?Valuables secured ?Linens and cotton and cotton/polyester blend (less than 51% polyester) ?Personal oil-based products / skin lotions / body lotions removed ?Wigs or hairpieces removed ?NA ?Smoking or tobacco materials removed ?NA ?Books / newspapers / magazines / loose paper removed ?NA ?Cologne, aftershave, perfume and deodorant removed ?Jewelry removed (may wrap wedding band) ?Make-up removed ?NA ?Hair care products removed ?Battery operated devices (external) removed ?NA ?Heating patches and chemical warmers removed ?NA ?Titanium eyewear removed ?NA ?Nail polish cured greater than 10 hours ?NA ?Casting material cured greater than 10 hours ?NA ?Hearing aids removed ?NA ?Loose dentures or partials removed ?NA ?Prosthetics have been  removed ?NA ?Patient demonstrates correct use of air break device (if applicable) ?Patient concerns have been addressed ?Patient grounding bracelet on and cord attached to chamber ?Specifics for Inpatients (complete in addition to ?above) ?Medication sheet sent with patient ?Intravenous  medications needed or due during therapy sent with patient ?Drainage tubes (e.g. nasogastric tube or chest tube secured and vented) ?Endotracheal or Tracheotomy tube secured ?Cuff deflated of air and inflated with saline ?Airway suctioned ?Electronic Signature(s) ?Signed: 02/11/2022 2:10:43 PM By: Enedina Finner RCP, RRT, CHT ?Entered By: Enedina Finner on 02/11/2022 11:20:58 ?

## 2022-02-12 NOTE — Progress Notes (Signed)
[place], [place] ([id number]) ?Visit Report for 02/11/2022 ?Problem List Details ?Patient Name: [name], [name] ?Date of Service: 02/11/2022 11:00 AM ?Medical Record Number: [id number] ?Patient Account Number: [id number] ?Date of Birth/Sex: [birth year] (56 y.o. 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 ?Date of Service: 02/11/2022 ?Medical Record Number: [id number] ?Patient Account Number: [id number] ?Date of Birth/Sex: 30-Aug-1966 (56 y.o. 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 pectoris ?I50.42 Chronic combined systolic (congestive) and diastolic (congestive) heart failure ?R26.89 Other abnormalities of gait and mobility ?Facility Procedures ?CPT4 Code: 53646803 ?Description: (Facility Use Only) HBOT, full body chamber, 13min ?Modifier: ?Quantity: 4 ?Physician Procedures ?CPT4 Code: 2122482 ?Description: 99183 - WC PHYS HYPERBARIC OXYGEN THERAPY ?Modifier: ?Quantity: 1 ?CPT4 Code: ?Description: ICD-10 Diagnosis Description [phone] Other chronic osteomyelitis, left ankle and foot 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

## 2022-02-12 NOTE — Progress Notes (Addendum)
Kimple, [place] ([id number]) ?Visit Report for 02/12/2022 ?HBO Details ?Patient Name: [name], [name] ?Date of Service: 02/12/2022 11:00 AM ?Medical Record Number: [id number] ?Patient Account Number: [id number] ?Date of Birth/Sex: 06-01-1966 (56 y.o. 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 ?HBO Treatment Details ?Treatment Number: 66 ?Patient Type: Outpatient ?Chamber Type: Monoplace ?Chamber Serial #: [id number] ?Treatment Protocol: 2.0 ATA with 90 minutes oxygen, and no air breaks ?Treatment Details ?Compression Rate Down: 1.5 psi / minute ?De-Compression Rate Up: 1.5 psi / minute ?Compress Tx Pressure Air breaks and breathing periods Decompress Decompress ?Begins Reached (leave unused spaces blank) Begins Ends ?Chamber Pressure (ATA) 1 2 - - - - - - 2 1 ?Clock Time (24 hr) 10:59 11:09 - - - - - - 12:39 12:50 ?Treatment Length: 111 (minutes) ?Treatment Segments: 4 ?Vital Signs ?Capillary Blood Glucose Reference Range: 80 - 120 mg / dl ?HBO Diabetic Blood Glucose Intervention Range: <131 mg/dl or >249 mg/dl ?Time Vitals Blood Respiratory Capillary Blood Glucose Pulse Action ?Type: ?Pulse: Temperature: ?Taken: ?Pressure: ?Rate: ?Glucose (mg/dl): ?Meter #: Oximetry (%) Taken: ?Pre 10:56 128/64 60 16 98.3 111 1 ?Post 12:53 138/82 78 16 98.3 136 1 ?Treatment Response ?Treatment Toleration: Well ?Treatment Completion ?Treatment Completed without Adverse Event ?Status: ?HBO Attestation ?I certify that I supervised this HBO treatment in accordance with ?Medicare guidelines. A trained emergency response team is readily Yes ?available per hospital  policies and procedures. ?Continue HBOT as ordered. Yes ?Electronic Signature(s) ?Signed: 02/12/2022 5:26:07 PM By: Kalman Shan DO ?Previous Signature: 02/12/2022 2:10:10 PM Version By: Donavan Burnet CHT, EMT, BS ?Previous Signature: 02/12/2022 12:45:28 PM Version By: Donavan Burnet CHT, EMT, BS ?Entered By: Kalman Shan on 02/12/2022 15:45:53 ?[place], [place] ([id number]) ?-------------------------------------------------------------------------------- ?HBO Safety Checklist Details ?Patient Name: [name], [name] ?Date of Service: 02/12/2022 11:00 AM ?Medical Record Number: [id number] ?Patient Account Number: [id number] ?Date of Birth/Sex: 1966-05-31 (55 y.o. M) ?Treating RN: [name] ?Primary Care Ionia Schey: [name] ?Other Clinician: Donavan Burnet ?Referring Adrianna Dudas: [name] ?Treating Jamariah Tony/Extender: Kalman Shan ?Weeks in Treatment: 14 ?HBO Safety Checklist Items ?Safety Checklist ?Consent Form Signed ?Patient voided / foley secured and emptied ?When did you last eato 1000 ?Last dose of injectable or oral agent N/A ?Ostomy pouch emptied and vented if applicable ?NA ?All implantable devices assessed, documented and approved ?NA ?Intravenous access site secured and place ?NA ?Valuables secured ?Linens and cotton and cotton/polyester blend (less than 51% polyester) ?Personal oil-based products / skin lotions / body lotions removed ?Wigs or hairpieces removed ?NA ?Smoking or tobacco materials removed ?NA ?Books / newspapers / magazines / loose paper removed ?Cologne, aftershave, perfume and deodorant removed ?Jewelry removed (may wrap wedding band) ?Make-up removed ?NA ?Hair care products removed ?Battery operated devices (external) removed ?Heating patches and chemical warmers removed ?Titanium eyewear removed ?NA ?Nail polish cured greater than 10 hours ?NA ?Casting material cured greater than 10 hours ?NA ?Hearing aids removed ?NA ?Loose dentures or partials  removed ?NA ?Prosthetics have been removed ?NA ?Patient demonstrates correct use of air break device (if applicable) ?Patient concerns have been addressed ?Patient grounding bracelet on and cord attached to chamber ?Specifics for Inpatients (complete in addition to ?above) ?NA Medication sheet sent with patient ?Intravenous medications needed or  due during therapy sent with patient ?NA ?Drainage tubes (e.g. nasogastric tube or chest tube secured and vented) ?NA ?Endotracheal or Tracheotomy tube secured ?NA ?Cuff deflated of air and inflated with saline ?NA ?Airway suctioned ?NA ?Notes ?Paper version used prior to treatment. ?Electronic Signature(s) ?Signed: 02/12/2022 11:43:35 AM By: Donavan Burnet CHT, EMT, BS ?Entered By: Donavan Burnet on 02/12/2022 11:43:35 ?

## 2022-02-12 NOTE — Progress Notes (Signed)
[place], [place] ([id number]) ?Visit Report for 02/11/2022 ?Arrival Information Details ?Patient Name: [name], [name] ?Date of Service: 02/11/2022 11:00 AM ?Medical Record Number: [id number] ?Patient Account Number: [id number] ?Date of Birth/Sex: [birth year] (56 y.o. M) ?Treating RN: [name] ?Primary Care Zayla Agar: [name] ?Other Clinician: Jacqulyn Bath ?Referring Erma Joubert: [name] ?Treating Cashay Manganelli/Extender: Jeri Cos ?Weeks in Treatment: 14 ?Visit Information History Since Last Visit ?Added or deleted any medications: No ?Patient Arrived: Ambulatory ?Any new allergies or adverse reactions: No ?Arrival Time: 09:50 ?Had a fall or experienced change in No ?Accompanied By: selfl ?activities of daily living that may affect ?Transfer Assistance: None ?risk of falls: ?Patient Identification Verified: Yes ?Signs or symptoms of abuse/neglect since last visito No ?Secondary Verification Process Completed: Yes ?Hospitalized since last visit: No ?Patient Requires Transmission-Based No ?Implantable device outside of the clinic excluding No ?Precautions: ?cellular tissue based products placed in the center ?Patient Has Alerts: Yes ?since last visit: ?Patient Alerts: Patient on Blood ?Has Dressing in Place as Prescribed: Yes ?Thinner ?Has Footwear/Offloading in Place as Prescribed: Yes ?DIALYSIS RIGHT ARM ?Left: Other:Defender Boot ?Aspirin 81/Brilinta ?Pain Present Now: No ?Electronic Signature(s) ?Signed: 02/11/2022 2:10:43 PM By: Enedina Finner RCP, RRT, CHT ?Entered By: Enedina Finner on 02/11/2022 11:11:35 ?[place], [place] ([id number]) ?-------------------------------------------------------------------------------- ?Encounter Discharge Information Details ?Patient Name: [name], [name] ?Date of Service: 02/11/2022 11:00 AM ?Medical Record Number: [id number] ?Patient Account Number: [id number] ?Date of Birth/Sex: 1966/03/09 (56 y.o. M) ?Treating RN: [name] ?Primary Care Taliesin Hartlage: [name] ?Other Clinician: Jacqulyn Bath ?Referring Emaan Gary: [name] ?Treating Nichelle Renwick/Extender: Jeri Cos ?Weeks in Treatment: 14 ?Encounter Discharge Information Items ?Discharge Condition: Stable ?Ambulatory Status: Ambulatory ?Discharge Destination: Home ?Transportation: Private Auto ?Accompanied By: self ?Schedule Follow-up Appointment: Yes ?Clinical Summary of Care: ?Notes ?Patient has an HBO treatment scheduled on 02/12/22 at 11:00 am. ?Electronic Signature(s) ?Signed: 02/11/2022 2:10:43 PM By: Enedina Finner RCP, RRT, CHT ?Entered By: Enedina Finner on 02/11/2022 14:07:15 ?[place], [place] ([id number]) ?-------------------------------------------------------------------------------- ?Vitals Details ?Patient Name: [name], [name] ?Date of Service: 02/11/2022 11:00 AM ?Medical Record Number: [id number] ?Patient Account Number: [id number] ?Date of Birth/Sex: 1966-08-08 (56 y.o. M) ?Treating RN: [name] ?Primary Care Monty Mccarrell: [name] ?Other Clinician: Jacqulyn Bath ?Referring Lainee Lehrman: [name] ?Treating Jameison Haji/Extender: Jeri Cos ?Weeks in Treatment: 14 ?Vital Signs ?Time Taken: 09:59 ?Temperature (??F): 97.7 ?Height (in): 70 ?Pulse (bpm): 72 ?Weight (lbs): 180.4 ?Respiratory Rate (breaths/min): 16 ?Body Mass Index (BMI): 25.9 ?Blood Pressure (mmHg): 130/78 ?Capillary Blood Glucose (mg/dl): 218 ?Reference Range: 80 - 120 mg / dl ?Electronic Signature(s) ?Signed: 02/11/2022 2:10:43 PM By: Enedina Finner RCP, RRT, CHT ?Entered By: Enedina Finner on 02/11/2022 11:16:47 ?

## 2022-02-12 NOTE — Progress Notes (Addendum)
[place], [place] ([id number]) ?Visit Report for 02/12/2022 ?Arrival Information Details ?Patient Name: [name], [name] ?Date of Service: 02/12/2022 11:00 AM ?Medical Record Number: [id number] ?Patient Account Number: [id number] ?Date of Birth/Sex: 10-15-1966 (56 y.o. M) ?Treating RN: [name] ?Primary Care Cartier Washko: [name] ?Other Clinician: Donavan Burnet ?Referring Laken Rog: [name] ?Treating Mando Blatz/Extender: Kalman Shan ?Weeks in Treatment: 14 ?Visit Information History Since Last Visit ?All ordered tests and consults were completed: Yes ?Patient Arrived: Ambulatory ?Added or deleted any medications: No ?Arrival Time: 10:50 ?Any new allergies or adverse reactions: No ?Accompanied By: self ?Had a fall or experienced change in No ?Transfer Assistance: None ?activities of daily living that may affect ?Patient Identification Verified: Yes ?risk of falls: ?Secondary Verification Process Completed: Yes ?Signs or symptoms of abuse/neglect since last visito No ?Patient Requires Transmission-Based No ?Hospitalized since last visit: No ?Precautions: ?Implantable device outside of the clinic excluding No ?Patient Has Alerts: Yes ?cellular tissue based products placed in the center ?Patient Alerts: Patient on Blood ?since last visit: ?Thinner ?Pain Present Now: No ?DIALYSIS RIGHT ARM ?Aspirin 81/Brilinta ?Electronic Signature(s) ?Signed: 02/12/2022 11:40:15 AM By: Donavan Burnet CHT, EMT, BS ?Entered By: Donavan Burnet on 02/12/2022 11:40:15 ?[place], [place] ([id number]) ?-------------------------------------------------------------------------------- ?Encounter Discharge Information Details ?Patient Name: Sloan, Vang L. ?Date of Service: 02/12/2022 11:00 AM ?Medical Record Number: [id number] ?Patient Account Number: [id number] ?Date of Birth/Sex: 12/10/1965 (56 y.o. M) ?Treating RN: [name] ?Primary Care Owen Pagnotta: [name] ?Other Clinician: Donavan Burnet ?Referring Rhylen Pulido: [name] ?Treating Brandt Chaney/Extender: Kalman Shan ?Weeks in Treatment: 14 ?Encounter Discharge Information Items ?Discharge Condition: Stable ?Ambulatory Status: Ambulatory ?Discharge Destination: Home ?Transportation: Private Auto ?Accompanied By: self ?Schedule Follow-up Appointment: No ?Clinical Summary of Care: ?Electronic Signature(s) ?Signed: 02/12/2022 2:12:33 PM By: Donavan Burnet CHT, EMT, BS ?Entered By: Donavan Burnet on 02/12/2022 14:12:33 ?[place], [place] ([id number]) ?-------------------------------------------------------------------------------- ?Vitals Details ?Patient Name: Clavijo, Keygan L. ?Date of Service: 02/12/2022 11:00 AM ?Medical Record Number: [id number] ?Patient Account Number: [id number] ?Date of Birth/Sex: September 07, 1966 (56 y.o. M) ?Treating RN: [name] ?Primary Care Brison Fiumara: [name] ?Other Clinician: Donavan Burnet ?Referring Jaidence Geisler: [name] ?Treating Evon Dejarnett/Extender: Kalman Shan ?Weeks in Treatment: 14 ?Vital Signs ?Time Taken: 10:56 ?Temperature (??F): 98.3 ?Height (in): 70 ?Pulse (bpm): 60 ?Weight (lbs): 180.4 ?Respiratory Rate (breaths/min): 16 ?Body Mass Index (BMI): 25.9 ?Blood Pressure (mmHg): 128/64 ?Capillary Blood Glucose (mg/dl): 111 ?Reference Range: 80 - 120 mg / dl ?Electronic Signature(s) ?Signed: 02/12/2022 11:41:46 AM By: Donavan Burnet CHT, EMT, BS ?Entered By: Donavan Burnet on 02/12/2022 11:41:45 ?

## 2022-02-13 ENCOUNTER — Encounter: Payer: Medicare Other | Admitting: Physician Assistant

## 2022-02-13 ENCOUNTER — Ambulatory Visit
Admission: RE | Admit: 2022-02-13 | Discharge: 2022-02-13 | Disposition: A | Discharge status: Home / Self Care | Payer: Medicare Other | Source: Ambulatory Visit | Attending: Physician Assistant | Admitting: Physician Assistant

## 2022-02-13 ENCOUNTER — Encounter: Payer: Medicare Other | Admitting: Internal Medicine

## 2022-02-13 DIAGNOSIS — I10 Essential (primary) hypertension: Secondary | ICD-10-CM | POA: Diagnosis not present

## 2022-02-13 DIAGNOSIS — L97509 Non-pressure chronic ulcer of other part of unspecified foot with unspecified severity: Secondary | ICD-10-CM | POA: Diagnosis not present

## 2022-02-13 DIAGNOSIS — E11621 Type 2 diabetes mellitus with foot ulcer: Secondary | ICD-10-CM | POA: Diagnosis not present

## 2022-02-13 DIAGNOSIS — I5042 Chronic combined systolic (congestive) and diastolic (congestive) heart failure: Secondary | ICD-10-CM | POA: Diagnosis not present

## 2022-02-13 DIAGNOSIS — M86672 Other chronic osteomyelitis, left ankle and foot: Secondary | ICD-10-CM | POA: Diagnosis not present

## 2022-02-13 DIAGNOSIS — R600 Localized edema: Secondary | ICD-10-CM | POA: Diagnosis not present

## 2022-02-13 DIAGNOSIS — I2510 Atherosclerotic heart disease of native coronary artery without angina pectoris: Secondary | ICD-10-CM | POA: Diagnosis not present

## 2022-02-13 DIAGNOSIS — M86172 Other acute osteomyelitis, left ankle and foot: Secondary | ICD-10-CM | POA: Diagnosis not present

## 2022-02-13 DIAGNOSIS — M7989 Other specified soft tissue disorders: Secondary | ICD-10-CM | POA: Diagnosis not present

## 2022-02-13 DIAGNOSIS — L97524 Non-pressure chronic ulcer of other part of left foot with necrosis of bone: Secondary | ICD-10-CM | POA: Diagnosis not present

## 2022-02-13 LAB — GLUCOSE, CAPILLARY
Glucose-Capillary: 188 mg/dL — ABNORMAL HIGH (ref 70–99)
Glucose-Capillary: 136 mg/dL — ABNORMAL HIGH (ref 70–99)

## 2022-02-13 NOTE — Progress Notes (Addendum)
[place], [place] ([id number]) ?Visit Report for 02/13/2022 ?Arrival Information Details ?Patient Name: [name], [name] ?Date of Service: 02/13/2022 11:00 AM ?Medical Record Number: [id number] ?Patient Account Number: [id number] ?Date of Birth/Sex: Feb 15, 1966 (56 y.o. M) ?Treating RN: [name] ?Primary Care Julann Mcgilvray: [name] ?Other Clinician: Donavan Burnet ?Referring Yoselin Amerman: [name] ?Treating Elaya Droege/Extender: Ricard Dillon ?Weeks in Treatment: 15 ?Visit Information History Since Last Visit ?All ordered tests and consults were completed: Yes ?Patient Arrived: Ambulatory ?Added or deleted any medications: No ?Arrival Time: 10:29 ?Any new allergies or adverse reactions: No ?Accompanied By: self ?Had a fall or experienced change in No ?Transfer Assistance: None ?activities of daily living that may affect ?Patient Identification Verified: Yes ?risk of falls: ?Secondary Verification Process Completed: Yes ?Signs or symptoms of abuse/neglect since last visito No ?Patient Requires Transmission-Based No ?Hospitalized since last visit: No ?Precautions: ?Implantable device outside of the clinic excluding No ?Patient Has Alerts: Yes ?cellular tissue based products placed in the center ?Patient Alerts: Patient on Blood ?since last visit: ?Thinner ?Pain Present Now: No ?DIALYSIS RIGHT ARM ?Aspirin 81/Brilinta ?Electronic Signature(s) ?Signed: 02/13/2022 11:47:16 AM By: Donavan Burnet CHT, EMT, BS ?Entered By: Donavan Burnet on 02/13/2022 11:47:15 ?[place], [place] ([id number]) ?-------------------------------------------------------------------------------- ?Encounter Discharge Information Details ?Patient Name: [name], [name] ?Date of Service: 02/13/2022 11:00 AM ?Medical Record Number: [id number] ?Patient Account Number: [id number] ?Date of Birth/Sex: 29-Sep-1966 (56 y.o. M) ?Treating RN: [name] ?Primary Care Shontell Prosser: [name] ?Other Clinician: Donavan Burnet ?Referring Zuha Dejonge: [name] ?Treating Nikoli Nasser/Extender: Ricard Dillon ?Weeks in Treatment: 15 ?Encounter Discharge Information Items ?Discharge Condition: Stable ?Ambulatory Status: Ambulatory ?Discharge Destination: Home ?Transportation: Private Auto ?Accompanied By: self ?Schedule Follow-up Appointment: No ?Clinical Summary of Care: ?Electronic Signature(s) ?Signed: 02/13/2022 1:19:07 PM By: Donavan Burnet CHT, EMT, BS ?Entered By: Donavan Burnet on 02/13/2022 13:19:07 ?[place], [place] ([id number]) ?-------------------------------------------------------------------------------- ?Vitals Details ?Patient Name: [name], [name] ?Date of Service: 02/13/2022 11:00 AM ?Medical Record Number: [id number] ?Patient Account Number: [id number] ?Date of Birth/Sex: 06-Jun-1966 (56 y.o. M) ?Treating RN: [name] ?Primary Care Luan Urbani: [name] ?Other Clinician: Donavan Burnet ?Referring Lehua Flores: [name] ?Treating Croix Presley/Extender: Ricard Dillon ?Weeks in Treatment: 15 ?Vital Signs ?Time Taken: 10:29 ?Temperature (??F): 96.8 ?Height (in): 70 ?Pulse (bpm): 78 ?Weight (lbs): 180.4 ?Respiratory Rate (breaths/min): 14 ?Body Mass Index (BMI): 25.9 ?Blood Pressure (mmHg): 142/80 ?Capillary Blood Glucose (mg/dl): 183 ?Reference Range: 80 - 120 mg / dl ?Electronic Signature(s) ?Signed: 02/13/2022 11:51:34 AM By: Donavan Burnet CHT, EMT, BS ?Entered By: Donavan Burnet on 02/13/2022 11:51:34 ?

## 2022-02-13 NOTE — Progress Notes (Addendum)
[name], [name] ([id number]) ?Visit Report for 02/13/2022 ?HBO Details ?Patient Name: [name], [name] ?Date of Service: 02/13/2022 11:00 AM ?Medical Record Number: [id number] ?Patient Account Number: [id number] ?Date of Birth/Sex: 03-07-1966 (56 y.o. 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 ?HBO Treatment Details ?Treatment Number: 54 ?Patient Type: Outpatient ?Chamber Type: Monoplace ?Chamber Serial #: [id number] ?Treatment Protocol: 2.0 ATA with 90 minutes oxygen, and no air breaks ?Treatment Details ?Compression Rate Down: 1.5 psi / minute ?De-Compression Rate Up: 2.0 psi / minute ?Compress Tx Pressure Air breaks and breathing periods Decompress Decompress ?Begins Reached (leave unused spaces blank) Begins Ends ?Chamber Pressure (ATA) 1 2 - - - - - - 2 1 ?Clock Time (24 hr) 10:49 11:00 - - - - - - 12:30 12:38 ?Treatment Length: 109 (minutes) ?Treatment Segments: 4 ?Vital Signs ?Capillary Blood Glucose Reference Range: 80 - 120 mg / dl ?HBO Diabetic Blood Glucose Intervention Range: <131 mg/dl or >249 mg/dl ?Time Vitals Blood Respiratory Capillary Blood Glucose Pulse Action ?Type: ?Pulse: Temperature: ?Taken: ?Pressure: ?Rate: ?Glucose (mg/dl): ?Meter #: Oximetry (%) Taken: ?Pre 10:29 142/80 78 14 96.8 183 1 ?Post 12:47 122/74 60 18 97.2 136 1 ?Treatment Response ?Treatment Toleration: Well ?Treatment Completion ?Treatment Completed without Adverse Event ?Status: ?Ermon Sagan Notes ?No concerns with treatment given ?HBO Attestation ?I certify that I supervised this HBO treatment in accordance with ?Medicare guidelines. A trained emergency  response team is readily Yes ?available per hospital policies and procedures. ?Continue HBOT as ordered. Yes ?Electronic Signature(s) ?Signed: 02/13/2022 4:18:52 PM By: Linton Ham MD ?Previous Signature: 02/13/2022 1:17:35 PM Version By: Donavan Burnet CHT, EMT, BS ?Previous Signature: 02/13/2022 11:58:44 AM Version By: Donavan Burnet CHT, EMT, BS ?Entered By: Linton Ham on 02/13/2022 16:17:32 ?[place], [place] ([id number]) ?-------------------------------------------------------------------------------- ?HBO Safety Checklist Details ?Patient Name: [name], [name] ?Date of Service: 02/13/2022 11:00 AM ?Medical Record Number: [id number] ?Patient Account Number: [id number] ?Date of Birth/Sex: 1966-09-21 (56 y.o. M) ?Treating RN: [name] ?Primary Care Jhan Conery: [name] ?Other Clinician: Donavan Burnet ?Referring Latria Mccarron: [name] ?Treating Ignatius Kloos/Extender: Ricard Dillon ?Weeks in Treatment: 15 ?HBO Safety Checklist Items ?Safety Checklist ?Consent Form Signed ?Patient voided / foley secured and emptied ?When did you last eato 0930 ?Last dose of injectable or oral agent N/A ?Ostomy pouch emptied and vented if applicable ?NA ?All implantable devices assessed, documented and approved ?NA ?Intravenous access site secured and place ?NA ?Valuables secured ?Linens and cotton and cotton/polyester blend (less than 51% polyester) ?Personal oil-based products / skin lotions / body lotions removed ?Wigs or hairpieces removed ?NA ?Smoking or tobacco materials removed ?NA ?Books / newspapers / magazines / loose paper removed ?Cologne, aftershave, perfume and deodorant removed ?Jewelry removed (may wrap wedding band) ?Make-up removed ?NA ?Hair care products removed ?Battery operated devices (external) removed ?Heating patches and chemical warmers removed ?Titanium eyewear removed ?NA ?Nail polish cured greater than 10 hours ?NA ?Casting material cured greater than 10 hours ?NA ?Hearing aids  removed ?NA ?Loose dentures or partials removed ?NA ?Prosthetics have been removed ?NA ?Patient demonstrates correct use of air break device (if applicable) ?Patient concerns have been addressed ?Patient grounding bracelet on and cord attached to chamber ?Specifics for Inpatients (complete in addition to ?above) ?NA  Medication sheet sent with patient ?Intravenous medications needed or due during therapy sent with patient ?NA ?Drainage tubes (e.g. nasogastric tube or chest tube secured and vented) ?NA ?Endotracheal or Tracheotomy tube secured ?NA ?Cuff deflated of air and inflated with saline ?NA ?Airway suctioned ?NA ?Notes ?Paper version used prior to treatment. ?Electronic Signature(s) ?Signed: 02/13/2022 11:52:34 AM By: Donavan Burnet CHT, EMT, BS ?Entered By: Donavan Burnet on 02/13/2022 11:52:34 ?

## 2022-02-14 ENCOUNTER — Encounter: Payer: Medicare Other | Admitting: Physician Assistant

## 2022-02-14 DIAGNOSIS — E11621 Type 2 diabetes mellitus with foot ulcer: Secondary | ICD-10-CM | POA: Diagnosis not present

## 2022-02-14 DIAGNOSIS — I5042 Chronic combined systolic (congestive) and diastolic (congestive) heart failure: Secondary | ICD-10-CM | POA: Diagnosis not present

## 2022-02-14 DIAGNOSIS — L97524 Non-pressure chronic ulcer of other part of left foot with necrosis of bone: Secondary | ICD-10-CM | POA: Diagnosis not present

## 2022-02-14 DIAGNOSIS — N2581 Secondary hyperparathyroidism of renal origin: Secondary | ICD-10-CM | POA: Diagnosis not present

## 2022-02-14 DIAGNOSIS — M86672 Other chronic osteomyelitis, left ankle and foot: Secondary | ICD-10-CM | POA: Diagnosis not present

## 2022-02-14 DIAGNOSIS — I10 Essential (primary) hypertension: Secondary | ICD-10-CM | POA: Diagnosis not present

## 2022-02-14 DIAGNOSIS — Z992 Dependence on renal dialysis: Secondary | ICD-10-CM | POA: Diagnosis not present

## 2022-02-14 DIAGNOSIS — N186 End stage renal disease: Secondary | ICD-10-CM | POA: Diagnosis not present

## 2022-02-14 DIAGNOSIS — L97522 Non-pressure chronic ulcer of other part of left foot with fat layer exposed: Secondary | ICD-10-CM | POA: Diagnosis not present

## 2022-02-14 DIAGNOSIS — I2510 Atherosclerotic heart disease of native coronary artery without angina pectoris: Secondary | ICD-10-CM | POA: Diagnosis not present

## 2022-02-14 DIAGNOSIS — D631 Anemia in chronic kidney disease: Secondary | ICD-10-CM | POA: Diagnosis not present

## 2022-02-14 DIAGNOSIS — L02612 Cutaneous abscess of left foot: Secondary | ICD-10-CM | POA: Diagnosis not present

## 2022-02-14 DIAGNOSIS — I96 Gangrene, not elsewhere classified: Secondary | ICD-10-CM | POA: Diagnosis not present

## 2022-02-14 LAB — GLUCOSE, CAPILLARY
Glucose-Capillary: 170 mg/dL — ABNORMAL HIGH (ref 70–99)
Glucose-Capillary: 150 mg/dL — ABNORMAL HIGH (ref 70–99)

## 2022-02-14 NOTE — Progress Notes (Addendum)
[place], [place] ([id number]) ?Visit Report for 02/14/2022 ?Chief Complaint Document Details ?Patient Name: Pineda, Tommie L. ?Date of Service: 02/14/2022 1:15 PM ?Medical Record Number: [id number] ?Patient Account Number: [id number] ?Date of Birth/Sex: 19-Nov-1965 (56 y.o. M) ?Treating RN: [name] ?Primary Care Provider: [name] Other Clinician: ?Referring Provider: [name] ?Treating Provider/Extender: Jeri Cos ?Weeks in Treatment: 15 ?Information Obtained from: Patient ?Chief Complaint ?HBO evaluation for treatment on the left foot diabetic ulcer with osteomyelitis. Patient is currently under our care as well for his foot. ?Electronic Signature(s) ?Signed: 02/14/2022 1:02:25 PM By: Worthy Keeler PA-C ?Entered By: Worthy Keeler on 02/14/2022 13:02:25 ?Benedicto, [place] ([id number]) ?-------------------------------------------------------------------------------- ?Debridement Details ?Patient Name: Macinnes, Sagar L. ?Date of Service: 02/14/2022 1:15 PM ?Medical Record Number: [id number] ?Patient Account Number: [id number] ?Date of Birth/Sex: [birth year] (56 y.o. M) ?Treating RN: Donnamarie Poag ?Primary Care Provider: [name] Other Clinician: ?Referring Provider: [name] ?Treating Provider/Extender: Jeri Cos ?Weeks in Treatment: 15 ?Debridement Performed for ?Wound #2 Left,Lateral Foot ?Assessment: ?Performed By: Physician Tommie Sams., PA-C ?Debridement Type: Debridement ?Severity of Tissue Pre Debridement: Fat layer exposed ?Level of Consciousness (Pre- ?Awake and Alert ?procedure): ?Pre-procedure Verification/Time Out ?Yes - 13:09 ?Taken: ?Start Time: 13:09 ?Total Area Debrided (L x W): 4 (cm) x 3 (cm) = 12 (cm?) ?Tissue and other material ?Viable, Non-Viable, Callus, Slough, Subcutaneous, Skin: Dermis , Skin: Epidermis, Slough ?debrided: ?Level: Skin/Subcutaneous Tissue ?Debridement Description: Excisional ?Instrument: Curette ?Bleeding: Minimum ?Hemostasis Achieved: Pressure ?End Time:  13:15 ?Procedural Pain: 0 ?Post Procedural Pain: 0 ?Response to Treatment: Procedure was tolerated well ?Level of Consciousness (Post- ?Awake and Alert ?procedure): ?Post Debridement Measurements of Total Wound ?Length: (cm) 3.8 ?Width: (cm) 2 ?Depth: (cm) 0.6 ?Volume: (cm?) 3.581 ?Character of Wound/Ulcer Post Debridement: Improved ?Severity of Tissue Post Debridement: Fat layer exposed ?Post Procedure Diagnosis ?Same as Pre-procedure ?Electronic Signature(s) ?Signed: 02/14/2022 4:06:41 PM By: Worthy Keeler PA-C ?Signed: 02/14/2022 4:09:30 PM By: Donnamarie Poag ?Entered ByDonnamarie Poag on 02/14/2022 13:11:19 ?[place], [place] ([id number]) ?-------------------------------------------------------------------------------- ?HPI Details ?Patient Name: Hanel, Remberto L. ?Date of Service: 02/14/2022 1:15 PM ?Medical Record Number: [id number] ?Patient Account Number: [id number] ?Date of Birth/Sex: 06-20-1966 (56 y.o. M) ?Treating RN: [name] ?Primary Care Provider: [name] Other Clinician: ?Referring Provider: [name] ?Treating Provider/Extender: Jeri Cos ?Weeks in Treatment: 15 ?History of Present Illness ?HPI Description: 10/31/2021 patient presents today for evaluation here in our clinic. He is actually being evaluated for hyperbaric oxygen therapy ?only. He has been seen at [facility] up to this point he is under the care of the vascular surgery specialty. Subsequently he is also ?under the care of the hyperbaric center there. With that being said that due to the fact that he is a dialysis patient he was actually recommended ?to be transferred to [place] for his treatments he actually lives in [place] which is where his dialysis is. It was a hardship for him to try to get from ?[place] especially on dialysis days all the way to [facility] get into the facility for his treatment and get back home he was literally spending ?all day long. He has had just 4 treatments there at [facility] thus far based on what  I see in these have not been continued while he awaits approval ?here in our clinic. Subsequently I do have records for review that will be included in the HPI predetermination review and attached to this chart as ?well. I will not duplicate that here. 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 Nonetheless he continues to have exposed bone with ?marked soft tissue swelling compatible with osteomyelitis. This is due to the irregularity of the head of the metatarsal as well. Nonetheless along ?with having associated cellulitis he is also good to be seen by infectious disease and is currently on antibiotics for this as well. Again that will be ?detailed in the pretreatment review section which I will attach to this note as well. He does currently have a wound VAC in place and again the ?wound was not evaluated here in the clinic as that is being completely managed by home health and [facility]. ?The patient does have a history of diabetes mellitus type 2, hypertension, coronary artery disease, and congestive heart failure. He also has ?cataracts of both eyes but no evidence of glaucoma on his most recent eye exam. ?11/22/2021 we have been seeing this gentleman for hyperbaric oxygen therapy but have not actually evaluated his wound up to this point this was ?being managed by the wound care and vascular team I presumed at [place] at least that is what has been told to be previous. Nonetheless at this ?point I got a call from Atlee Abide who is a Librarian, academic at the [facility] vascular clinic with Dr. Durene Fruits and subsequently they were wanting ?to know if I could take over wound care for this patient. Apparently they do not have the ability for an office debridement which again I completely ?understand but nonetheless definitely is not an integral part of his healing  along with the hyperbarics. Subsequently I discussed with the patient ?today that I do believe he would benefit going ahead and proceeding with evaluation here in the clinic for that reason I did go ahead and see him ?today to get things started. There is also been some confusion about the issues with his home health nurse and getting measurements to [facility] for ?the wound VAC in order to continue his wound VAC therapy. With that being said after I see him today we will make a determination on what to ?do with wound VAC we can definitely send measurements to [facility] but again the bigger question is whether this is the best way to go or not. ?11/28/2021 upon evaluation today patient appears to be doing decently well in regard to his wound. He has been tolerating the dressing changes ?with the Dakin's moistened gauze dressing which I think is doing a good job. Fortunately I do not see any signs of active infection locally nor ?systemically at this point. In general I think that he is actually making excellent progress which is great news. ?2/17; seen in conjunction with HBO today. 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 Much improved condition ?of the wound surface. On the lateral foot and eschared area that he has been using Betadine. Tolerating HBO well ?12/13/2021 upon evaluation today patient appears to be doing pretty well in regard to his foot ulcer. I am actually much more pleased currently ?that I been with the way the appearance looks today. The wound bed does show some signs of slough and biofilm on the surface of the wound ?this is minimal I Minna perform debridement the I will do so very carefully due to the fact that to be honest the patient had a significant amount of ?bleeding  in the past being on the Brilinta he had a difficult time clotting when I debrided him previously quite significantly. Fortunately I do not ?see any signs of active infection at this time which is great  news. No fevers, chills, nausea, vomiting, or diarrhea. ?12/20/2021 upon evaluation today patient actually appears to be showing some signs of excellent improvement here. I am very pleased with where ?things s

## 2022-02-14 NOTE — Progress Notes (Addendum)
Mignone, [place] ([id number]) ?Visit Report for 02/14/2022 ?Arrival Information Details ?Patient Name: Debose, Jartavious L. ?Date of Service: 02/14/2022 11:00 AM ?Medical Record Number: [id number] ?Patient Account Number: [id number] ?Date of Birth/Sex: [birth year] (56 y.o. M) ?Treating RN: [name] ?Primary Care Kinston Magnan: [name] ?Other Clinician: Donavan Burnet ?Referring Aneta Hendershott: [name] ?Treating Dirk Vanaman/Extender: Jeri Cos ?Weeks in Treatment: 15 ?Visit Information History Since Last Visit ?All ordered tests and consults were completed: Yes ?Patient Arrived: Ambulatory ?Added or deleted any medications: No ?Arrival Time: 10:08 ?Any new allergies or adverse reactions: No ?Accompanied By: self ?Had a fall or experienced change in No ?Transfer Assistance: None ?activities of daily living that may affect ?Patient Identification Verified: Yes ?risk of falls: ?Secondary Verification Process Completed: Yes ?Signs or symptoms of abuse/neglect since last visito No ?Patient Requires Transmission-Based No ?Hospitalized since last visit: No ?Precautions: ?Implantable device outside of the clinic excluding No ?Patient Has Alerts: Yes ?cellular tissue based products placed in the center ?Patient Alerts: Patient on Blood ?since last visit: ?Thinner ?Pain Present Now: No ?DIALYSIS RIGHT ARM ?Aspirin 81/Brilinta ?Electronic Signature(s) ?Signed: 02/14/2022 11:43:00 AM By: Donavan Burnet CHT, EMT, BS ?Entered By: Donavan Burnet on 02/14/2022 11:43:00 ?[place], [place] ([id number]) ?-------------------------------------------------------------------------------- ?Encounter Discharge Information Details ?Patient Name: Sheahan, Stalin L. ?Date of Service: 02/14/2022 11:00 AM ?Medical Record Number: [id number] ?Patient Account Number: [id number] ?Date of Birth/Sex: Mar 16, 1966 (56 y.o. M) ?Treating RN: [name] ?Primary Care Kathee Tumlin: [name] ?Other Clinician: Donavan Burnet ?Referring Yaira Bernardi: [name] ?Treating Zuzu Befort/Extender: Jeri Cos ?Weeks in Treatment: 15 ?Encounter Discharge Information Items ?Discharge Condition: Stable ?Ambulatory Status: Ambulatory ?Discharge Destination: Other (Note Required) ?Transportation: Other ?Accompanied By: staff ?Schedule Follow-up Appointment: No ?Clinical Summary of Care: ?Notes ?Patient has wound care encounter after HBOT. ?Electronic Signature(s) ?Signed: 02/14/2022 1:33:56 PM By: Donavan Burnet CHT, EMT, BS ?Entered By: Donavan Burnet on 02/14/2022 13:33:56 ?R[place], [place] ([id number]) ?-------------------------------------------------------------------------------- ?Vitals Details ?Patient Name: Janak, Edge L. ?Date of Service: 02/14/2022 11:00 AM ?Medical Record Number: [id number] ?Patient Account Number: [id number] ?Date of Birth/Sex: 02-17-1966 (56 y.o. M) ?Treating RN: [name] ?Primary Care Dazia Lippold: [name] ?Other Clinician: Donavan Burnet ?Referring Barri Neidlinger: [name] ?Treating Mussa [name]/Extender: Jeri Cos ?Weeks in Treatment: 15 ?Vital Signs ?Time Taken: 10:15 ?Temperature (??F): 98.5 ?Height (in): 70 ?Pulse (bpm): 66 ?Weight (lbs): 180.4 ?Respiratory Rate (breaths/min): 18 ?Body Mass Index (BMI): 25.9 ?Blood Pressure (mmHg): 118/68 ?Capillary Blood Glucose (mg/dl): 170 ?Reference Range: 80 - 120 mg / dl ?Electronic Signature(s) ?Signed: 02/14/2022 11:44:14 AM By: Donavan Burnet CHT, EMT, BS ?Entered By: Donavan Burnet on 02/14/2022 11:44:14 ?

## 2022-02-14 NOTE — Progress Notes (Signed)
[name] ([id number]) ?Visit Report for 02/14/2022 ?Arrival Information Details ?Patient Name: [name], [name] ?Date of Service: 02/14/2022 1:15 PM ?Medical Record Number: [id number] ?Patient Account Number: [id number] ?Date of Birth/Sex: Apr 27, 1966 (56 y.o. M) ?Treating RN: Donnamarie Poag ?Primary Care [name] Other Clinician: ?Referring [name] ?Treating [name] ?Weeks in Treatment: 15 ?Visit Information History Since Last Visit ?Added or deleted any medications: No ?Patient Arrived: Ambulatory ?Had a fall or experienced change in No ?Arrival Time: 12:50 ?activities of daily living that may affect ?Accompanied By: self ?risk of falls: ?Transfer Assistance: None ?Hospitalized since last visit: No ?Patient Identification Verified: Yes ?Has Dressing in Place as Prescribed: Yes ?Secondary Verification Process Completed: Yes ?Pain Present Now: Yes ?Patient Requires Transmission-Based No ?Precautions: ?Patient Has Alerts: Yes ?Patient Alerts: Patient on Blood ?Thinner ?DIALYSIS RIGHT ARM ?Aspirin 81/Brilinta ?Electronic Signature(s) ?Signed: 02/14/2022 4:09:30 PM By: Donnamarie Poag ?Entered ByDonnamarie Poag on 02/14/2022 12:51:08 ?[name], [name] ([id number]) ?-------------------------------------------------------------------------------- ?Clinic Level of Care Assessment Details ?Patient Name: [name], [name] ?Date of Service: 02/14/2022 1:15 PM ?Medical Record Number: [id number] ?Patient Account Number: [id number] ?Date of Birth/Sex: 10-May-1966 (56 y.o. M) ?Treating RN: Donnamarie Poag ?Primary Care Carlyann Placide: [name] Other Clinician: ?Referring Aveer Bartow: [name] ?Treating Khush Pasion/Extender: [name] ?Weeks in Treatment: 15 ?Clinic Level of Care Assessment Items ?TOOL 1 Quantity Score ?[]  - Use when EandM and Procedure is performed on INITIAL visit 0 ?ASSESSMENTS - Nursing Assessment / Reassessment ?[]  - General Physical Exam (combine w/ comprehensive assessment  (listed just below) when performed on [name] ?0 ?pt. evals) ?[]  - 0 ?Comprehensive Assessment (HX, ROS, Risk Assessments, Wounds Hx, etc.) ?ASSESSMENTS - Wound and Skin Assessment / Reassessment ?[]  - Dermatologic / Skin Assessment (not related to wound area) 0 ?ASSESSMENTS - Ostomy and/or Continence Assessment and Care ?[]  - Incontinence Assessment and Management 0 ?[]  - 0 ?Ostomy Care Assessment and Management (repouching, etc.) ?PROCESS - Coordination of Care ?[]  - Simple Patient / Family Education for ongoing care 0 ?[]  - 0 ?Complex (extensive) Patient / Family Education for ongoing care ?[]  - 0 ?Staff obtains Consents, Records, Test Results / Process Orders ?[]  - 0 ?Staff telephones [place], Nursing Homes / Clarify orders / etc ?[]  - 0 ?Routine Transfer to another Facility (non-emergent condition) ?[]  - 0 ?Routine Hospital Admission (non-emergent condition) ?[]  - 0 ?[name] Admissions / Biomedical engineer / Ordering NPWT, Apligraf, etc. ?[]  - 0 ?Emergency Hospital Admission (emergent condition) ?PROCESS - Special Needs ?[]  - Pediatric / Minor Patient Management 0 ?[]  - 0 ?Isolation Patient Management ?[]  - 0 ?Hearing / Language / Visual special needs ?[]  - 0 ?Assessment of Community assistance (transportation, D/C planning, etc.) ?[]  - 0 ?Additional assistance / Altered mentation ?[]  - 0 ?Support Surface(s) Assessment (bed, cushion, seat, etc.) ?INTERVENTIONS - Miscellaneous ?[]  - External ear exam 0 ?[]  - 0 ?Patient Transfer (multiple staff / Civil Service fast streamer / Similar devices) ?[]  - 0 ?Simple Staple / Suture removal (25 or less) ?[]  - 0 ?Complex Staple / Suture removal (26 or more) ?[]  - 0 ?Hypo/Hyperglycemic Management (do not check if billed separately) ?[]  - 0 ?Ankle / Brachial Index (ABI) - do not check if billed separately ?Has the patient been seen at the hospital within the last three years: Yes ?Total Score: 0 ?Level Of Care: ____ ?[name], [name] ([id number]) ?Electronic Signature(s) ?Signed: 02/14/2022 4:09:30  PM By: Donnamarie Poag ?Entered ByDonnamarie Poag on 02/14/2022 13:11:54 ?[name], [name] ([id number]) ?-------------------------------------------------------------------------------- ?Encounter Discharge Information Details ?Patient  Name: [name], [name] ?Date of Service: 02/14/2022 1:15 PM ?Medical Record Number: [id number] ?Patient Account Number: [id number] ?Date of Birth/Sex: 01/02/1966 (56 y.o. M) ?Treating RN: Donnamarie Poag ?Primary Care Robyn Galati: [name] Other Clinician: ?Referring Ashely Goosby: [name] ?Treating Thom Ollinger/Extender: [name] ?Weeks in Treatment: 15 ?Encounter Discharge Information Items Post Procedure Vitals ?Discharge Condition: Stable ?Temperature (?F): 98.5 ?Ambulatory Status: Ambulatory ?Pulse (bpm): 72 ?Discharge Destination: Home ?Respiratory Rate (breaths/min): 18 ?Transportation: Private Auto ?Blood Pressure (mmHg): 134/92 ?Accompanied By: self ?Schedule Follow-up Appointment: Yes ?Clinical Summary of Care: Patient Declined ?Electronic Signature(s) ?Signed: 02/14/2022 4:09:30 PM By: Donnamarie Poag ?Entered ByDonnamarie Poag on 02/14/2022 13:13:30 ?[name], [name] ([id number]) ?-------------------------------------------------------------------------------- ?Lower Extremity Assessment Details ?Patient Name: [name], [name] ?Date of Service: 02/14/2022 1:15 PM ?Medical Record Number: [id number] ?Patient Account Number: [id number] ?Date of Birth/Sex: [birth year] (56 y.o. M) ?Treating RN: Donnamarie Poag ?Primary Care [place]: [name] Other Clinician: ?Referring Hai Grabe: [name] ?Treating Alverna Fawley/Extender: [name] ?Weeks in Treatment: 15 ?Edema Assessment ?Assessed: [Left: Yes] [Right: No] ?Edema: [Left: N] [Right: o] ?Electronic Signature(s) ?Signed: 02/14/2022 4:09:30 PM By: Donnamarie Poag ?Entered ByDonnamarie Poag on 02/14/2022 12:59:51 ?[place], [name] ([id number]) ?-------------------------------------------------------------------------------- ?Multi Wound Chart Details ?Patient Name:  [name], [name] ?Date of Service: 02/14/2022 1:15 PM ?Medical Record Number: [id number] ?Patient Account Number: [id number] ?Date of Birth/Sex: 06/14/1966 (56 y.o. 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Volume (cm?) : 1.885 3.581 N/A ?% Reduction in Area: 83.30% -90.00% N/A ?% Reduction in Volume: 95.40% -1040.40% N/A ?Classification: Full Thickness With Exposed Grade 2 N/A ?Support Structures ?Exudate Amount: Medium Medium N/A ?Exudate Type: Serous Serosanguineous N/A ?Exudate Color: amber red, brown N/A ?Granulation Amount: Medium (34-66%) Small (1-33%) N/A ?Granulation Quality: Red, Pink Red N/A ?Necrotic Amount: Medium (34-66%) Large (67-100%) N/A ?Necrotic Tissue: Adherent OconeeExposed Structures: ?Fat Layer (Subcutaneous Tissue): Fat Layer (Subcutaneous  Tissue): N/A ?Yes Yes ?Fascia: No ?Tendon: Yes ?Tendon: No ?Joint: Yes ?Muscle: No ?Fascia: No ?Joint: No ?Muscle: No ?Bone: No ?Bone: No ?Epithelialization: N/A None N/A ?Treatment Notes ?Electronic Signatu

## 2022-02-14 NOTE — Progress Notes (Addendum)
[name], [name] ([id number]) ?Visit Report for 02/14/2022 ?HBO Details ?Patient Name: [name], [name]. ?Date of Service: 02/14/2022 11:00 AM ?Medical Record Number: [id number] ?Patient Account Number: [id number] ?Date of Birth/Sex: 1966-10-18 (56 y.o. 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 ?HBO Treatment Details ?Treatment Number: 96 ?Patient Type: Outpatient ?Chamber Type: Monoplace ?Chamber Serial #: [id number] ?Treatment Protocol: 2.0 ATA with 90 minutes oxygen, and no air breaks ?Treatment Details ?Compression Rate Down: 1.5 psi / minute ?De-Compression Rate Up: 2.0 psi / minute ?Compress Tx Pressure Air breaks and breathing periods Decompress Decompress ?Begins Reached (leave unused spaces blank) Begins Ends ?Chamber Pressure (ATA) 1 2 - - - - - - 2 1 ?Clock Time (24 hr) 10:45 10:56 - - - - - - 12:26 12:35 ?Treatment Length: 110 (minutes) ?Treatment Segments: 4 ?Vital Signs ?Capillary Blood Glucose Reference Range: 80 - 120 mg / dl ?HBO Diabetic Blood Glucose Intervention Range: <131 mg/dl or >249 mg/dl ?Time Vitals Blood Respiratory Capillary Blood Glucose Pulse Action ?Type: ?Pulse: Temperature: ?Taken: ?Pressure: ?Rate: ?Glucose (mg/dl): ?Meter #: Oximetry (%) Taken: ?Pre 10:15 118/68 66 18 98.5 170 1 none per protocol ?Post 12:38 132/92 72 18 98.5 150 1 none per protocol ?Treatment Response ?Treatment Toleration: Well ?Treatment Completion ?Treatment Completed without Adverse Event ?Status: ?Electronic Signature(s) ?Signed: 02/14/2022 1:32:35 PM By: Donavan Burnet CHT, EMT, BS ?Signed: 02/14/2022 4:06:41 PM By: Worthy Keeler  PA-C ?Entered By: Donavan Burnet on 02/14/2022 13:32:35 ?[place], [place] ([id number]) ?-------------------------------------------------------------------------------- ?HBO Safety Checklist Details ?Patient Name: [name], [name]. ?Date of Service: 02/14/2022 11:00 AM ?Medical Record Number: [id number] ?Patient Account Number: [id number] ?Date of Birth/Sex: [birth year] (56 y.o. M) ?Treating RN: [name] ?Primary Care Lorene Samaan: [name] ?Other Clinician: Donavan Burnet ?Referring Sulay Brymer: [name] ?Treating Blakeley Margraf/Extender: Jeri Cos ?Weeks in Treatment: 15 ?HBO Safety Checklist Items ?Safety Checklist ?Consent Form Signed ?Patient voided / foley secured and emptied ?When did you last eato 1010 ?Last dose of injectable or oral agent N/A ?Ostomy pouch emptied and vented if applicable ?NA ?All implantable devices assessed, documented and approved ?NA ?Intravenous access site secured and place ?NA ?Valuables secured ?Linens and cotton and cotton/polyester blend (less than 51% polyester) ?Personal oil-based products / skin lotions / body lotions removed ?Wigs or hairpieces removed ?NA ?Smoking or tobacco materials removed ?NA ?Books / newspapers / magazines / loose paper removed ?Cologne, aftershave, perfume and deodorant removed ?Jewelry removed (may wrap wedding band) ?Make-up removed ?NA ?Hair care products removed ?Battery operated devices (external) removed ?Heating patches and chemical warmers removed ?Titanium eyewear removed ?NA ?Nail polish cured greater than 10 hours ?NA ?Casting material cured greater than 10 hours ?NA ?Hearing aids removed ?NA ?Loose dentures or partials removed ?NA ?Prosthetics have been removed ?NA ?Patient demonstrates correct use of air break device (if applicable) ?Patient concerns have been addressed ?Patient grounding bracelet on and cord attached to chamber ?Specifics for Inpatients (complete in addition to ?above) ?NA Medication sheet sent with patient ?Intravenous  medications needed or due during therapy sent with patient ?NA ?Drainage tubes (e.g. nasogastric tube or chest tube secured and vented) ?NA ?Endotracheal or Tracheotomy tube secured ?NA ?Cuff deflated of air and inflated with saline ?NA ?Airway suctioned ?NA ?Notes ?Paper version used  prior to treatment. ?Electronic Signature(s) ?Signed: 02/14/2022 11:46:53 AM By: Donavan Burnet CHT, EMT, BS ?Entered By: Donavan Burnet on 02/14/2022 11:46:53 ?

## 2022-02-17 ENCOUNTER — Encounter: Payer: Medicare Other | Attending: Physician Assistant | Admitting: Physician Assistant

## 2022-02-17 DIAGNOSIS — E11621 Type 2 diabetes mellitus with foot ulcer: Secondary | ICD-10-CM | POA: Diagnosis not present

## 2022-02-17 DIAGNOSIS — Z7901 Long term (current) use of anticoagulants: Secondary | ICD-10-CM | POA: Diagnosis not present

## 2022-02-17 DIAGNOSIS — I2510 Atherosclerotic heart disease of native coronary artery without angina pectoris: Secondary | ICD-10-CM | POA: Diagnosis not present

## 2022-02-17 DIAGNOSIS — L02612 Cutaneous abscess of left foot: Secondary | ICD-10-CM | POA: Diagnosis not present

## 2022-02-17 DIAGNOSIS — Z899 Acquired absence of limb, unspecified: Secondary | ICD-10-CM | POA: Insufficient documentation

## 2022-02-17 DIAGNOSIS — N2581 Secondary hyperparathyroidism of renal origin: Secondary | ICD-10-CM | POA: Diagnosis not present

## 2022-02-17 DIAGNOSIS — D631 Anemia in chronic kidney disease: Secondary | ICD-10-CM | POA: Diagnosis not present

## 2022-02-17 DIAGNOSIS — M86672 Other chronic osteomyelitis, left ankle and foot: Secondary | ICD-10-CM | POA: Insufficient documentation

## 2022-02-17 DIAGNOSIS — I110 Hypertensive heart disease with heart failure: Secondary | ICD-10-CM | POA: Diagnosis not present

## 2022-02-17 DIAGNOSIS — Z992 Dependence on renal dialysis: Secondary | ICD-10-CM | POA: Diagnosis not present

## 2022-02-17 DIAGNOSIS — R2689 Other abnormalities of gait and mobility: Secondary | ICD-10-CM | POA: Diagnosis not present

## 2022-02-17 DIAGNOSIS — N186 End stage renal disease: Secondary | ICD-10-CM | POA: Diagnosis not present

## 2022-02-17 DIAGNOSIS — Z5181 Encounter for therapeutic drug level monitoring: Secondary | ICD-10-CM | POA: Diagnosis not present

## 2022-02-17 DIAGNOSIS — I5042 Chronic combined systolic (congestive) and diastolic (congestive) heart failure: Secondary | ICD-10-CM | POA: Insufficient documentation

## 2022-02-17 DIAGNOSIS — I96 Gangrene, not elsewhere classified: Secondary | ICD-10-CM | POA: Diagnosis not present

## 2022-02-17 DIAGNOSIS — I120 Hypertensive chronic kidney disease with stage 5 chronic kidney disease or end stage renal disease: Secondary | ICD-10-CM | POA: Diagnosis not present

## 2022-02-17 DIAGNOSIS — L97524 Non-pressure chronic ulcer of other part of left foot with necrosis of bone: Secondary | ICD-10-CM | POA: Insufficient documentation

## 2022-02-17 DIAGNOSIS — E1129 Type 2 diabetes mellitus with other diabetic kidney complication: Secondary | ICD-10-CM | POA: Diagnosis not present

## 2022-02-17 DIAGNOSIS — Z4781 Encounter for orthopedic aftercare following surgical amputation: Secondary | ICD-10-CM | POA: Diagnosis not present

## 2022-02-17 DIAGNOSIS — E1122 Type 2 diabetes mellitus with diabetic chronic kidney disease: Secondary | ICD-10-CM | POA: Diagnosis not present

## 2022-02-17 DIAGNOSIS — E1151 Type 2 diabetes mellitus with diabetic peripheral angiopathy without gangrene: Secondary | ICD-10-CM | POA: Diagnosis not present

## 2022-02-17 LAB — GLUCOSE, CAPILLARY
Glucose-Capillary: 126 mg/dL — ABNORMAL HIGH (ref 70–99)
Glucose-Capillary: 154 mg/dL — ABNORMAL HIGH (ref 70–99)

## 2022-02-17 NOTE — Progress Notes (Signed)
[place], [place] ([id number]) ?Visit Report for 02/17/2022 ?Arrival Information Details ?Patient Name: Kalama, Reedy L. ?Date of Service: 02/17/2022 11:00 AM ?Medical Record Number: [id number] ?Patient Account Number: [id number] ?Date of Birth/Sex: 03/10/1966 (56 y.o. M) ?Treating RN: Cornell Barman ?Primary Care Keryl Gholson: [name] ?Other Clinician: Jacqulyn Bath ?Referring Kimbra Marcelino: [name] ?Treating Valisa Karpel/Extender: Jeri Cos ?Weeks in Treatment: 15 ?Visit Information History Since Last Visit ?Added or deleted any medications: No ?Patient Arrived: Ambulatory ?Any new allergies or adverse reactions: No ?Arrival Time: 09:55 ?Had a fall or experienced change in No ?Accompanied By: self ?activities of daily living that may affect ?Transfer Assistance: None ?risk of falls: ?Patient Identification Verified: Yes ?Signs or symptoms of abuse/neglect since last visito No ?Secondary Verification Process Completed: Yes ?Hospitalized since last visit: No ?Patient Requires Transmission-Based No ?Implantable device outside of the clinic excluding No ?Precautions: ?cellular tissue based products placed in the center ?Patient Has Alerts: Yes ?since last visit: ?Patient Alerts: Patient on Blood ?Has Dressing in Place as Prescribed: Yes ?Thinner ?Has Footwear/Offloading in Place as Prescribed: Yes ?DIALYSIS RIGHT ARM ?Left: Other:Defender Boot ?Aspirin 81/Brilinta ?Pain Present Now: No ?Electronic Signature(s) ?Signed: 02/17/2022 2:08:16 PM By: Enedina Finner RCP, RRT, CHT ?Entered By: Enedina Finner on 02/17/2022 12:42:06 ?Pung, [place] ([id number]) ?-------------------------------------------------------------------------------- ?Encounter Discharge Information Details ?Patient Name: Ballew, Nagi L. ?Date of Service: 02/17/2022 11:00 AM ?Medical Record Number: [id number] ?Patient Account Number: [id number] ?Date of Birth/Sex: Dec 27, 1965 (56 y.o. M) ?Treating RN: Cornell Barman ?Primary Care Talulah Schirmer: [name] ?Other Clinician: Jacqulyn Bath ?Referring Alahia Whicker: [name] ?Treating Jeanluc Wegman/Extender: Jeri Cos ?Weeks in Treatment: 15 ?Encounter Discharge Information Items ?Discharge Condition: Stable ?Ambulatory Status: Ambulatory ?Discharge Destination: Home ?Transportation: Private Auto ?Schedule Follow-up Appointment: Yes ?Clinical Summary of Care: ?Notes ?Patient has an HBO treatment scheduled on 02/18/22 at 11:00 am. ?Electronic Signature(s) ?Signed: 02/17/2022 2:08:16 PM By: Enedina Finner RCP, RRT, CHT ?Entered By: Enedina Finner on 02/17/2022 13:59:49 ?[place], [place] ([id number]) ?-------------------------------------------------------------------------------- ?Vitals Details ?Patient Name: Kingdon, Jarry L. ?Date of Service: 02/17/2022 11:00 AM ?Medical Record Number: [id number] ?Patient Account Number: [id number] ?Date of Birth/Sex: 25-Feb-1966 (56 y.o. M) ?Treating RN: Cornell Barman ?Primary Care Bette Brienza: [name] ?Other Clinician: Jacqulyn Bath ?Referring Shadasia Oldfield: [name] ?Treating Laquitha Heslin/Extender: Jeri Cos ?Weeks in Treatment: 15 ?Vital Signs ?Time Taken: 10:04 ?Temperature (??F): 98.4 ?Height (in): 70 ?Pulse (bpm): 72 ?Weight (lbs): 180.4 ?Respiratory Rate (breaths/min): 16 ?Body Mass Index (BMI): 25.9 ?Blood Pressure (mmHg): 126/76 ?Capillary Blood Glucose (mg/dl): 126 ?Reference Range: 80 - 120 mg / dl ?Electronic Signature(s) ?Signed: 02/17/2022 2:08:16 PM By: Enedina Finner RCP, RRT, CHT ?Entered By: Enedina Finner on 02/17/2022 12:42:39 ?

## 2022-02-17 NOTE — Progress Notes (Signed)
[name], [name] ([id number]) ?Visit Report for 02/17/2022 ?HBO Details ?Patient Name: [name], [name] ?Date of Service: 02/17/2022 11:00 AM ?Medical Record Number: [id number] ?Patient Account Number: [id number] ?Date of Birth/Sex: 10/02/1966 (56 y.o. M) ?Treating RN: Cornell Barman ?Primary Care Onesimo Lingard: [name] ?Other Clinician: Jacqulyn Bath ?Referring Alaiyah Bollman: [name] ?Treating Emine Lopata/Extender: Jeri Cos ?Weeks in Treatment: 15 ?HBO Treatment Course Details ?Treatment Course Number: 1 ?Ordering Luverne Zerkle: Jeri Cos ?Total Treatments Ordered: 80 ?HBO Treatment Start Date: 11/04/2021 ?HBO Indication: ?Chronic Refractory Osteomyelitis to Left Foot ?HBO Treatment Details ?Treatment Number: 24 ?Patient Type: Outpatient ?Chamber Type: Monoplace ?Chamber Serial #: [id number] ?Treatment Protocol: 2.0 ATA with 90 minutes oxygen, and no air breaks ?Treatment Details ?Compression Rate Down: 1.0 psi / minute ?De-Compression Rate Up: 1.5 psi / minute ?Compress Tx Pressure Air breaks and breathing periods Decompress Decompress ?Begins Reached (leave unused spaces blank) Begins Ends ?Chamber Pressure (ATA) 1 2 - - - - - - 2 1 ?Clock Time (24 hr) 10:52 11:08 - - - - - - 12:38 12:48 ?Treatment Length: 116 (minutes) ?Treatment Segments: 4 ?Vital Signs ?Capillary Blood Glucose Reference Range: 80 - 120 mg / dl ?HBO Diabetic Blood Glucose Intervention Range: <131 mg/dl or >249 mg/dl ?Time Vitals Blood Respiratory Capillary Blood Glucose Pulse Action ?Type: ?Pulse: Temperature: ?Taken: ?Pressure: ?Rate: ?Glucose (mg/dl): ?Meter #: Oximetry (%) Taken: ?Pre 10:04 126/76 72 16 98.4 126 1 none per protocol ?Post 12:53 128/72 72 16 98.1 154 1 none per protocol ?Treatment Response ?Treatment Toleration: Well ?Treatment Completion ?Treatment Completed without Adverse Event ?Status: ?Electronic Signature(s) ?Signed: 02/17/2022 2:08:16 PM By: Enedina Finner RCP, RRT, CHT ?Signed: 02/17/2022 4:35:29 PM By: Worthy Keeler  PA-C ?Entered By: Enedina Finner on 02/17/2022 13:58:46 ?[name], [name] ([id number]) ?-------------------------------------------------------------------------------- ?HBO Safety Checklist Details ?Patient Name: [name], [name] ?Date of Service: 02/17/2022 11:00 AM ?Medical Record Number: [id number] ?Patient Account Number: [id number] ?Date of Birth/Sex: September 12, 1966 (56 y.o. M) ?Treating RN: Cornell Barman ?Primary Care Alania Overholt: [name] ?Other Clinician: Jacqulyn Bath ?Referring Evanthia Maund: [name] ?Treating Keyontay Stolz/Extender: Jeri Cos ?Weeks in Treatment: 15 ?HBO Safety Checklist Items ?Safety Checklist ?Consent Form Signed ?Patient voided / foley secured and emptied ?When did you last eato 09:30 am ?Last dose of injectable or oral agent n/a ?Ostomy pouch emptied and vented if applicable ?NA ?All implantable devices assessed, documented and approved ?NA ?Intravenous access site secured and place ?NA ?Valuables secured ?Linens and cotton and cotton/polyester blend (less than 51% polyester) ?Personal oil-based products / skin lotions / body lotions removed ?Wigs or hairpieces removed ?NA ?Smoking or tobacco materials removed ?NA ?Books / newspapers / magazines / loose paper removed ?NA ?Cologne, aftershave, perfume and deodorant removed ?Jewelry removed (may wrap wedding band) ?Make-up removed ?NA ?Hair care products removed ?Battery operated devices (external) removed ?NA ?Heating patches and chemical warmers removed ?NA ?Titanium eyewear removed ?NA ?Nail polish cured greater than 10 hours ?NA ?Casting material cured greater than 10 hours ?NA ?Hearing aids removed ?NA ?Loose dentures or partials removed ?NA ?Prosthetics have been removed ?NA ?Patient demonstrates correct use of air break device (if applicable) ?Patient concerns have been addressed ?Patient grounding bracelet on and cord attached to chamber ?Specifics for Inpatients (complete in addition to ?above) ?Medication sheet sent with  patient ?Intravenous medications needed or due during therapy sent with patient ?Drainage tubes (e.g. nasogastric tube or chest tube secured and vented) ?Endotracheal or Tracheotomy tube secured ?Cuff deflated of air and inflated with saline ?Airway suctioned ?Electronic Signature(s) ?Signed: 02/17/2022  2:08:16 PM By: Enedina Finner RCP, RRT, CHT ?Entered By: Enedina Finner on 02/17/2022 12:44:16 ?

## 2022-02-18 ENCOUNTER — Encounter: Payer: Medicare Other | Admitting: Physician Assistant

## 2022-02-18 DIAGNOSIS — E1151 Type 2 diabetes mellitus with diabetic peripheral angiopathy without gangrene: Secondary | ICD-10-CM | POA: Diagnosis not present

## 2022-02-18 DIAGNOSIS — N186 End stage renal disease: Secondary | ICD-10-CM | POA: Diagnosis not present

## 2022-02-18 DIAGNOSIS — M86672 Other chronic osteomyelitis, left ankle and foot: Secondary | ICD-10-CM | POA: Diagnosis not present

## 2022-02-18 DIAGNOSIS — I5042 Chronic combined systolic (congestive) and diastolic (congestive) heart failure: Secondary | ICD-10-CM | POA: Diagnosis not present

## 2022-02-18 DIAGNOSIS — I120 Hypertensive chronic kidney disease with stage 5 chronic kidney disease or end stage renal disease: Secondary | ICD-10-CM | POA: Diagnosis not present

## 2022-02-18 DIAGNOSIS — L97524 Non-pressure chronic ulcer of other part of left foot with necrosis of bone: Secondary | ICD-10-CM | POA: Diagnosis not present

## 2022-02-18 DIAGNOSIS — I2510 Atherosclerotic heart disease of native coronary artery without angina pectoris: Secondary | ICD-10-CM | POA: Diagnosis not present

## 2022-02-18 DIAGNOSIS — E11621 Type 2 diabetes mellitus with foot ulcer: Secondary | ICD-10-CM | POA: Diagnosis not present

## 2022-02-18 DIAGNOSIS — D631 Anemia in chronic kidney disease: Secondary | ICD-10-CM | POA: Diagnosis not present

## 2022-02-18 DIAGNOSIS — Z4781 Encounter for orthopedic aftercare following surgical amputation: Secondary | ICD-10-CM | POA: Diagnosis not present

## 2022-02-18 DIAGNOSIS — E1122 Type 2 diabetes mellitus with diabetic chronic kidney disease: Secondary | ICD-10-CM | POA: Diagnosis not present

## 2022-02-18 DIAGNOSIS — I110 Hypertensive heart disease with heart failure: Secondary | ICD-10-CM | POA: Diagnosis not present

## 2022-02-18 LAB — GLUCOSE, CAPILLARY
Glucose-Capillary: 225 mg/dL — ABNORMAL HIGH (ref 70–99)
Glucose-Capillary: 123 mg/dL — ABNORMAL HIGH (ref 70–99)

## 2022-02-18 NOTE — Progress Notes (Signed)
[place], [place] ([id number]) ?Visit Report for 02/18/2022 ?Arrival Information Details ?Patient Name: [name], [name] ?Date of Service: 02/18/2022 8:00 AM ?Medical Record Number: [id number] ?Patient Account Number: [id number] ?Date of Birth/Sex: 02-Mar-1966 (56 y.o. M) ?Treating RN: Cornell Barman ?Primary Care Loria Lacina: [name] ?Other Clinician: Jacqulyn Bath ?Referring Lucilla Petrenko: [name] ?Treating Railynn Ballo/Extender: Jeri Cos ?Weeks in Treatment: 15 ?Visit Information History Since Last Visit ?Added or deleted any medications: No ?Patient Arrived: Ambulatory ?Any new allergies or adverse reactions: No ?Arrival Time: 07:55 ?Had a fall or experienced change in No ?Accompanied By: self ?activities of daily living that may affect ?Transfer Assistance: None ?risk of falls: ?Patient Identification Verified: Yes ?Signs or symptoms of abuse/neglect since last visito No ?Secondary Verification Process Completed: Yes ?Hospitalized since last visit: No ?Patient Requires Transmission-Based No ?Implantable device outside of the clinic excluding No ?Precautions: ?cellular tissue based products placed in the center ?Patient Has Alerts: Yes ?since last visit: ?Patient Alerts: Patient on Blood ?Has Dressing in Place as Prescribed: Yes ?Thinner ?Has Footwear/Offloading in Place as Prescribed: Yes ?DIALYSIS RIGHT ARM ?Left: Other:Defender Boot ?Aspirin 81/Brilinta ?Pain Present Now: No ?Electronic Signature(s) ?Signed: 02/18/2022 11:28:58 AM By: Enedina Finner RCP, RRT, CHT ?Entered By: Enedina Finner on 02/18/2022 08:30:11 ?[name], [name] ([id number]) ?-------------------------------------------------------------------------------- ?Encounter Discharge Information Details ?Patient Name: [name], [name] ?Date of Service: 02/18/2022 8:00 AM ?Medical Record Number: [id number] ?Patient Account Number: [id number] ?Date of Birth/Sex: 1965-12-05 (56 y.o. M) ?Treating RN: Cornell Barman ?Primary Care Aedyn [name]: [name] ?Other Clinician: Jacqulyn Bath ?Referring Dynasti Kerman: [name] ?Treating Marlissa Emerick/Extender: Jeri Cos ?Weeks in Treatment: 15 ?Encounter Discharge Information Items ?Discharge Condition: Stable ?Ambulatory Status: Ambulatory ?Discharge Destination: Home ?Transportation: Private Auto ?Accompanied By: self ?Schedule Follow-up Appointment: Yes ?Clinical Summary of Care: ?Notes ?Patient has an HBO treatment scheduled on 02/19/22 at 11:00 am. ?Electronic Signature(s) ?Signed: 02/18/2022 11:28:58 AM By: Enedina Finner RCP, RRT, CHT ?Entered By: Enedina Finner on 02/18/2022 11:27:18 ?[name] ([id number]) ?-------------------------------------------------------------------------------- ?Vitals Details ?Patient Name: [name], [name] ?Date of Service: 02/18/2022 8:00 AM ?Medical Record Number: [id number] ?Patient Account Number: [id number] ?Date of Birth/Sex: 1966/01/02 (56 y.o. M) ?Treating RN: Cornell Barman ?Primary Care Garv Kuechle: [name] ?Other Clinician: Jacqulyn Bath ?Referring Camrie Stock: [name] ?Treating Jisell Majer/Extender: Jeri Cos ?Weeks in Treatment: 15 ?Vital Signs ?Time Taken: 08:00 ?Temperature (??F): 97.6 ?Height (in): 70 ?Pulse (bpm): 72 ?Weight (lbs): 180.4 ?Respiratory Rate (breaths/min): 16 ?Body Mass Index (BMI): 25.9 ?Blood Pressure (mmHg): 128/74 ?Capillary Blood Glucose (mg/dl): 225 ?Reference Range: 80 - 120 mg / dl ?Electronic Signature(s) ?Signed: 02/18/2022 11:28:58 AM By: Enedina Finner RCP, RRT, CHT ?Entered By: Enedina Finner on 02/18/2022 08:32:35 ?

## 2022-02-18 NOTE — Progress Notes (Signed)
[place], [place] ([id number]) ?Visit Report for 02/18/2022 ?HBO Details ?Patient Name: [name], [name] ?Date of Service: 02/18/2022 8:00 AM ?Medical Record Number: [id number] ?Patient Account Number: [id number] ?Date of Birth/Sex: 1966-07-26 (56 y.o. 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 ?HBO Treatment Details ?Treatment Number: 56 ?Patient Type: Outpatient ?Chamber Type: Monoplace ?Chamber Serial #: [id number] ?Treatment Protocol: 2.0 ATA with 90 minutes oxygen, and no air breaks ?Treatment Details ?Compression Rate Down: 1.5 psi / minute ?De-Compression Rate Up: 1.5 psi / minute ?Compress Tx Pressure Air breaks and breathing periods Decompress Decompress ?Begins Reached (leave unused spaces blank) Begins Ends ?Chamber Pressure (ATA) 1 2 - - - - - - 2 1 ?Clock Time (24 hr) 08:16 08:28 - - - - - - 09:58 10:07 ?Treatment Length: 111 (minutes) ?Treatment Segments: 4 ?Vital Signs ?Capillary Blood Glucose Reference Range: 80 - 120 mg / dl ?HBO Diabetic Blood Glucose Intervention Range: <131 mg/dl or >249 mg/dl ?Time Vitals Blood Respiratory Capillary Blood Glucose Pulse Action ?Type: ?Pulse: Temperature: ?Taken: ?Pressure: ?Rate: ?Glucose (mg/dl): ?Meter #: Oximetry (%) Taken: ?Pre 08:00 128/74 72 16 97.6 225 1 none per protocol ?Post 10:14 126/76 72 16 98 123 1 none per protocol ?Treatment Response ?Treatment Toleration: Well ?Treatment Completion ?Treatment Completed without Adverse Event ?Status: ?HBO Attestation ?I certify that I supervised this HBO treatment in accordance with ?Medicare guidelines. A trained emergency response team is readily  Yes ?available per hospital policies and procedures. ?Continue HBOT as ordered. Yes ?Electronic Signature(s) ?Signed: 02/18/2022 4:19:27 PM By: Worthy Keeler PA-C ?Previous Signature: 02/18/2022 11:28:58 AM Version By: Enedina Finner RCP, RRT, CHT ?Entered By: Worthy Keeler on 02/18/2022 16:19:27 ?[name], [name] ([id number]) ?-------------------------------------------------------------------------------- ?HBO Safety Checklist Details ?Patient Name: [name], [name] ?Date of Service: 02/18/2022 8:00 AM ?Medical Record Number: [id number] ?Patient Account Number: [id number] ?Date of Birth/Sex: [birth year] (56 y.o. M) ?Treating RN: Cornell Barman ?Primary Care Danon Lograsso: [name] ?Other Clinician: Jacqulyn Bath ?Referring Vickye Astorino: [name] ?Treating Reno Clasby/Extender: Jeri Cos ?Weeks in Treatment: 15 ?HBO Safety Checklist Items ?Safety Checklist ?Consent Form Signed ?Patient voided / foley secured and emptied ?When did you last eato 07:00 am ?Last dose of injectable or oral agent n/a ?Ostomy pouch emptied and vented if applicable ?NA ?All implantable devices assessed, documented and approved ?NA ?Intravenous access site secured and place ?NA ?Valuables secured ?Linens and cotton and cotton/polyester blend (less than 51% polyester) ?Personal oil-based products / skin lotions / body lotions removed ?Wigs or hairpieces removed ?NA ?Smoking or tobacco materials removed ?NA ?Books / newspapers / magazines / loose paper removed ?NA ?Cologne, aftershave, perfume and deodorant removed ?Jewelry removed (may wrap wedding band) ?Make-up removed ?NA ?Hair care products removed ?Battery operated devices (external) removed ?NA ?Heating patches and chemical warmers removed ?NA ?Titanium eyewear removed ?NA ?Nail polish cured greater than 10 hours ?NA ?Casting material cured greater than 10 hours ?NA ?Hearing aids removed ?NA ?Loose dentures or partials removed ?NA ?Prosthetics have been removed ?NA ?Patient  demonstrates correct use of air break device (if applicable) ?Patient concerns have been addressed ?Patient grounding bracelet on and cord attached to chamber ?Specifics for Inpatients (complete in addition to ?above) ?Medication sheet sent with patient ?Intravenous medications needed or  due during therapy sent with patient ?Drainage tubes (e.g. nasogastric tube or chest tube secured and vented) ?Endotracheal or Tracheotomy tube secured ?Cuff deflated of air and inflated with saline ?Airway suctioned ?Electronic Signature(s) ?Signed: 02/18/2022 11:28:58 AM By: Enedina Finner RCP, RRT, CHT ?Entered By: Enedina Finner on 02/18/2022 08:33:59 ?

## 2022-02-18 NOTE — Progress Notes (Signed)
[place], [place] ([id number]) ?Visit Report for 02/18/2022 ?Problem List Details ?Patient Name: [name], [name] ?Date of Service: 02/18/2022 8:00 AM ?Medical Record Number: [id number] ?Patient Account Number: [id number] ?Date of Birth/Sex: Oct 10, 1966 (56 y.o. 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 ?Date of Service: 02/18/2022 ?Medical Record Number: [id number] ?Patient Account Number: [id number] ?Date of Birth/Sex: 1966-10-09 (56 y.o. 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 pectoris ?I50.42 Chronic combined systolic (congestive) and diastolic (congestive) heart failure ?R26.89 Other abnormalities of gait and mobility ?Facility Procedures ?CPT4 Code: 35329924 ?Description: (Facility Use Only) HBOT, full body chamber, 84min ?Modifier: ?Quantity: 4 ?Physician Procedures ?CPT4 Code: 2683419 ?Description: 99183 - WC PHYS HYPERBARIC OXYGEN THERAPY ?Modifier: ?Quantity: 1 ?CPT4 Code: ?Description: ICD-10 Diagnosis Description [phone] Other chronic osteomyelitis, left ankle and foot 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

## 2022-02-19 ENCOUNTER — Encounter (HOSPITAL_BASED_OUTPATIENT_CLINIC_OR_DEPARTMENT_OTHER): Payer: Medicare Other | Admitting: Internal Medicine

## 2022-02-19 DIAGNOSIS — I96 Gangrene, not elsewhere classified: Secondary | ICD-10-CM | POA: Diagnosis not present

## 2022-02-19 DIAGNOSIS — L02612 Cutaneous abscess of left foot: Secondary | ICD-10-CM | POA: Diagnosis not present

## 2022-02-19 DIAGNOSIS — E11621 Type 2 diabetes mellitus with foot ulcer: Secondary | ICD-10-CM | POA: Diagnosis not present

## 2022-02-19 DIAGNOSIS — I5042 Chronic combined systolic (congestive) and diastolic (congestive) heart failure: Secondary | ICD-10-CM | POA: Diagnosis not present

## 2022-02-19 DIAGNOSIS — N2581 Secondary hyperparathyroidism of renal origin: Secondary | ICD-10-CM | POA: Diagnosis not present

## 2022-02-19 DIAGNOSIS — Z992 Dependence on renal dialysis: Secondary | ICD-10-CM | POA: Diagnosis not present

## 2022-02-19 DIAGNOSIS — M86672 Other chronic osteomyelitis, left ankle and foot: Secondary | ICD-10-CM | POA: Diagnosis not present

## 2022-02-19 DIAGNOSIS — L97524 Non-pressure chronic ulcer of other part of left foot with necrosis of bone: Secondary | ICD-10-CM | POA: Diagnosis not present

## 2022-02-19 DIAGNOSIS — N186 End stage renal disease: Secondary | ICD-10-CM | POA: Diagnosis not present

## 2022-02-19 DIAGNOSIS — I2510 Atherosclerotic heart disease of native coronary artery without angina pectoris: Secondary | ICD-10-CM | POA: Diagnosis not present

## 2022-02-19 DIAGNOSIS — D631 Anemia in chronic kidney disease: Secondary | ICD-10-CM | POA: Diagnosis not present

## 2022-02-19 DIAGNOSIS — I110 Hypertensive heart disease with heart failure: Secondary | ICD-10-CM | POA: Diagnosis not present

## 2022-02-19 LAB — GLUCOSE, CAPILLARY
Glucose-Capillary: 150 mg/dL — ABNORMAL HIGH (ref 70–99)
Glucose-Capillary: 123 mg/dL — ABNORMAL HIGH (ref 70–99)

## 2022-02-19 NOTE — Progress Notes (Signed)
[place]och, [place] ([id number]) ?Visit Report for 02/19/2022 ?HBO Details ?Patient Name: [name], [name] ?Date of Service: 02/19/2022 11:00 AM ?Medical Record Number: [id number] ?Patient Account Number: [id number] ?Date of Birth/Sex: 1966/03/31 (56 y.o. 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 ?HBO Treatment Details ?Treatment Number: 78 ?Patient Type: Outpatient ?Chamber Type: Monoplace ?Chamber Serial #: [id number] ?Treatment Protocol: 2.0 ATA with 90 minutes oxygen, and no air breaks ?Treatment Details ?Compression Rate Down: 1.0 psi / minute ?De-Compression Rate Up: 1.5 psi / minute ?Compress Tx Pressure Air breaks and breathing periods Decompress Decompress ?Begins Reached (leave unused spaces blank) Begins Ends ?Chamber Pressure (ATA) 1 2 - - - - - - 2 1 ?Clock Time (24 hr) 11:08 11:24 - - - - - - 12:54 13:04 ?Treatment Length: 116 (minutes) ?Treatment Segments: 4 ?Vital Signs ?Capillary Blood Glucose Reference Range: 80 - 120 mg / dl ?HBO Diabetic Blood Glucose Intervention Range: <131 mg/dl or >249 mg/dl ?Time Vitals Blood Respiratory Capillary Blood Glucose Pulse Action ?Type: ?Pulse: Temperature: ?Taken: ?Pressure: ?Rate: ?Glucose (mg/dl): ?Meter #: Oximetry (%) Taken: ?Pre 10:10 122/68 72 16 98 150 1 none per protocol ?Post 13:15 130/82 72 16 97.9 123 1 none per protocol ?Treatment Response ?Treatment Toleration: Well ?Treatment Completion ?Treatment Completed without Adverse Event ?Status: ?HBO Attestation ?I certify that I supervised this HBO treatment in accordance with ?Medicare guidelines. A trained emergency response team is readily  Yes ?available per hospital policies and procedures. ?Continue HBOT as ordered. Yes ?Electronic Signature(s) ?Signed: 02/19/2022 3:24:58 PM By: Kalman Shan DO ?Entered By: Kalman Shan on 02/19/2022 14:37:33 ?[place], [place] ([id number]) ?-------------------------------------------------------------------------------- ?HBO Safety Checklist Details ?Patient Name: [name], [name] ?Date of Service: 02/19/2022 11:00 AM ?Medical Record Number: [id number] ?Patient Account Number: [id number] ?Date of Birth/Sex: 06-10-1966 (56 y.o. M) ?Treating RN: Cornell Barman ?Primary Care Addylynn Balin: [name] ?Other Clinician: Jacqulyn Bath ?Referring Corrina Steffensen: [name] ?Treating Reef Achterberg/Extender: Kalman Shan ?Weeks in Treatment: 15 ?HBO Safety Checklist Items ?Safety Checklist ?Consent Form Signed ?Patient voided / foley secured and emptied ?When did you last eato 7:00 am ?Last dose of injectable or oral agent n/a ?Ostomy pouch emptied and vented if applicable ?NA ?All implantable devices assessed, documented and approved ?NA ?Intravenous access site secured and place ?NA ?Valuables secured ?Linens and cotton and cotton/polyester blend (less than 51% polyester) ?Personal oil-based products / skin lotions / body lotions removed ?Wigs or hairpieces removed ?NA ?Smoking or tobacco materials removed ?NA ?Books / newspapers / magazines / loose paper removed ?NA ?Cologne, aftershave, perfume and deodorant removed ?Jewelry removed (may wrap wedding band) ?Make-up removed ?NA ?Hair care products removed ?Battery operated devices (external) removed ?NA ?Heating patches and chemical warmers removed ?NA ?Titanium eyewear removed ?NA ?Nail polish cured greater than 10 hours ?NA ?Casting material cured greater than 10 hours ?NA ?Hearing aids removed ?NA ?Loose dentures or partials removed ?NA ?Prosthetics have been removed ?NA ?Patient demonstrates correct use of air break device (if applicable) ?Patient concerns have been  addressed ?Patient grounding bracelet on and cord attached to chamber ?Specifics for Inpatients (complete in addition to ?above) ?Medication sheet sent with patient ?Intravenous medications needed or due during therapy sent with patient ?Drainage tubes (e.g. nasogastric tube or chest tube secured  and vented) ?Endotracheal or Tracheotomy tube secured ?Cuff deflated of air and inflated with saline ?Airway suctioned ?Electronic Signature(s) ?Signed: 02/19/2022 2:38:55 PM By: Enedina Finner RCP, RRT, CHT ?Entered By: Enedina Finner on 02/19/2022 12:06:14 ?

## 2022-02-19 NOTE — Progress Notes (Signed)
Dejager, [place] ([id number]) ?Visit Report for 02/19/2022 ?Arrival Information Details ?Patient Name: [name], [name] ?Date of Service: 02/19/2022 11:00 AM ?Medical Record Number: [id number] ?Patient Account Number: [id number] ?Date of Birth/Sex: [birth year] (56 y.o. M) ?Treating RN: [name] ?Primary Care [name]: [name] ?Other Clinician: Jacqulyn [name] ?Referring [name]: [name] ?Treating [name]/Extender: [name] ?Weeks in Treatment: 15 ?Visit Information History Since Last Visit ?Added or deleted any medications: No ?Patient Arrived: Ambulatory ?Any new allergies or adverse reactions: No ?Arrival Time: 10:00 ?Had a fall or experienced change in No ?Accompanied By: self ?activities of daily living that may affect ?Transfer Assistance: None ?risk of falls: ?Patient Identification Verified: Yes ?Signs or symptoms of abuse/neglect since last visito No ?Secondary Verification Process Completed: Yes ?Hospitalized since last visit: No ?Patient Requires Transmission-Based No ?Implantable device outside of the clinic excluding No ?Precautions: ?cellular tissue based products placed in the center ?Patient Has Alerts: Yes ?since last visit: ?Patient Alerts: Patient on Blood ?Pain Present Now: No ?Thinner ?DIALYSIS RIGHT ARM ?Aspirin 81/Brilinta ?Electronic Signature(s) ?Signed: 02/19/2022 2:38:55 PM By: Enedina Finner RCP, RRT, [name] ?Entered By: Enedina Finner on 02/19/2022 11:55:58 ?[name], [name] ([id number]) ?-------------------------------------------------------------------------------- ?Encounter Discharge Information Details ?Patient Name: [name], [name] ?Date of Service: 02/19/2022 11:00 AM ?Medical Record Number: [id number] ?Patient Account Number: [id number] ?Date of Birth/Sex: October 22, 1965 (56 y.o. M) ?Treating RN: [name] ?Primary Care [name]: [name] ?Other Clinician: Jacqulyn [name] ?Referring [name]: [name] ?Treating [name]/Extender: [name] ?Weeks in Treatment: 15 ?Encounter Discharge Information Items ?Discharge Condition: Stable ?Ambulatory Status: Ambulatory ?Discharge Destination: Home ?Transportation: Private Auto ?Accompanied By: self ?Schedule Follow-up Appointment: Yes ?Clinical Summary of Care: ?Notes ?Patient has an HBO treatment scheduled on 02/20/22 at 08:00 am. ?Electronic Signature(s) ?Signed: 02/19/2022 2:38:55 PM By: Enedina Finner RCP, RRT, [name] ?Entered By: Enedina Finner on 02/19/2022 14:14:38 ?[name], [name] ([id number]) ?-------------------------------------------------------------------------------- ?Vitals Details ?Patient Name: [name], [name] ?Date of Service: 02/19/2022 11:00 AM ?Medical Record Number: [id number] ?Patient Account Number: [id number] ?Date of Birth/Sex: 17-Jun-1966 (56 y.o. M) ?Treating RN: [name] ?Primary Care [name]: [name] ?Other Clinician: Jacqulyn [name] ?Referring [name]: [name] ?Treating [name]/Extender: [name] ?Weeks in Treatment: 15 ?Vital Signs ?Time Taken: 10:10 ?Temperature (??F): 98.0 ?Height (in): 70 ?Pulse (bpm): 72 ?Weight (lbs): 180.4 ?Respiratory Rate (breaths/min): 16 ?Body Mass Index (BMI): 25.9 ?Blood Pressure (mmHg): 122/68 ?Capillary Blood Glucose (mg/dl): 150 ?Reference Range: 80 - 120 mg / dl ?Electronic Signature(s) ?Signed: 02/19/2022 2:38:55 PM By: Enedina Finner RCP, RRT, [name] ?Entered By: Enedina Finner on 02/19/2022 12:04:08 ?

## 2022-02-20 ENCOUNTER — Encounter: Payer: Medicare Other | Admitting: Physician Assistant

## 2022-02-20 DIAGNOSIS — I110 Hypertensive heart disease with heart failure: Secondary | ICD-10-CM | POA: Diagnosis not present

## 2022-02-20 DIAGNOSIS — E11621 Type 2 diabetes mellitus with foot ulcer: Secondary | ICD-10-CM | POA: Diagnosis not present

## 2022-02-20 DIAGNOSIS — L97524 Non-pressure chronic ulcer of other part of left foot with necrosis of bone: Secondary | ICD-10-CM | POA: Diagnosis not present

## 2022-02-20 DIAGNOSIS — I2510 Atherosclerotic heart disease of native coronary artery without angina pectoris: Secondary | ICD-10-CM | POA: Diagnosis not present

## 2022-02-20 DIAGNOSIS — M86672 Other chronic osteomyelitis, left ankle and foot: Secondary | ICD-10-CM | POA: Diagnosis not present

## 2022-02-20 DIAGNOSIS — I5042 Chronic combined systolic (congestive) and diastolic (congestive) heart failure: Secondary | ICD-10-CM | POA: Diagnosis not present

## 2022-02-20 LAB — GLUCOSE, CAPILLARY
Glucose-Capillary: 119 mg/dL — ABNORMAL HIGH (ref 70–99)
Glucose-Capillary: 135 mg/dL — ABNORMAL HIGH (ref 70–99)

## 2022-02-20 NOTE — Progress Notes (Signed)
[place], [place] ([id number]) ?Visit Report for 02/20/2022 ?Arrival Information Details ?Patient Name: [name], [name] ?Date of Service: 02/20/2022 8:00 AM ?Medical Record Number: [id number] ?Patient Account Number: [id number] ?Date of Birth/Sex: 12/13/1965 (56 y.o. M) ?Treating RN: [name] ?Primary Care Donja Tipping: [name] ?Other Clinician: Jacqulyn Bath ?Referring Felder Lebeda: [name] ?Treating Carlos Quackenbush/Extender: Jeri Cos ?Weeks in Treatment: 16 ?Visit Information History Since Last Visit ?Added or deleted any medications: No ?Patient Arrived: Ambulatory ?Any new allergies or adverse reactions: No ?Arrival Time: 08:00 ?Had a fall or experienced change in No ?Accompanied By: self ?activities of daily living that may affect ?Transfer Assistance: None ?risk of falls: ?Patient Identification Verified: Yes ?Signs or symptoms of abuse/neglect since last visito No ?Secondary Verification Process Completed: Yes ?Hospitalized since last visit: No ?Patient Requires Transmission-Based No ?Implantable device outside of the clinic excluding No ?Precautions: ?cellular tissue based products placed in the center ?Patient Has Alerts: Yes ?since last visit: ?Patient Alerts: Patient on Blood ?Has Dressing in Place as Prescribed: Yes ?Thinner ?Has Footwear/Offloading in Place as Prescribed: Yes ?DIALYSIS RIGHT ARM ?Left: Other:Defender Boot ?Aspirin 81/Brilinta ?Pain Present Now: No ?Electronic Signature(s) ?Signed: 02/20/2022 1:10:16 PM By: Enedina Finner RCP, RRT, CHT ?Entered By: Enedina Finner on 02/20/2022 08:31:20 ?[place], [place] ([id number]) ?-------------------------------------------------------------------------------- ?Encounter Discharge Information Details ?Patient Name: [name], [name] ?Date of Service: 02/20/2022 8:00 AM ?Medical Record Number: [id number] ?Patient Account Number: [id number] ?Date of Birth/Sex: [birth year] (56 y.o. M) ?Treating RN: [name] ?Primary Care Kace Hartje: [name] ?Other Clinician: Jacqulyn Bath ?Referring Jamese Trauger: [name] ?Treating Karington Zarazua/Extender: Jeri Cos ?Weeks in Treatment: 16 ?Encounter Discharge Information Items ?Discharge Condition: Stable ?Ambulatory Status: Ambulatory ?Discharge Destination: Home ?Transportation: Private Auto ?Accompanied By: self ?Schedule Follow-up Appointment: Yes ?Clinical Summary of Care: ?Notes ?Patient has an HBO treatment scheduled on 02/21/22 at 11:00 am. ?Electronic Signature(s) ?Signed: 02/20/2022 1:10:16 PM By: Enedina Finner RCP, RRT, CHT ?Entered By: Enedina Finner on 02/20/2022 11:27:37 ?[place], [place] ([id number]) ?-------------------------------------------------------------------------------- ?Vitals Details ?Patient Name: [name], [name] ?Date of Service: 02/20/2022 8:00 AM ?Medical Record Number: [id number] ?Patient Account Number: [id number] ?Date of Birth/Sex: 1966-08-04 (56 y.o. M) ?Treating RN: [name] ?Primary Care Emillee Talsma: [name] ?Other Clinician: Jacqulyn Bath ?Referring Waynette Towers: [name] ?Treating Jahden Schara/Extender: Jeri Cos ?Weeks in Treatment: 16 ?Vital Signs ?Time Taken: 08:00 ?Temperature (??F): 98.8 ?Height (in): 70 ?Pulse (bpm): 72 ?Weight (lbs): 180.4 ?Respiratory Rate (breaths/min): 16 ?Body Mass Index (BMI): 25.9 ?Blood Pressure (mmHg): 132/68 ?Capillary Blood Glucose (mg/dl): 119 ?Reference Range: 80 - 120 mg / dl ?Electronic Signature(s) ?Signed: 02/20/2022 1:10:16 PM By: Enedina Finner RCP, RRT, CHT ?Entered By: Enedina Finner on 02/20/2022 08:32:06 ?

## 2022-02-21 ENCOUNTER — Encounter: Payer: Medicare Other | Admitting: Physician Assistant

## 2022-02-21 DIAGNOSIS — Z992 Dependence on renal dialysis: Secondary | ICD-10-CM | POA: Diagnosis not present

## 2022-02-21 DIAGNOSIS — M86672 Other chronic osteomyelitis, left ankle and foot: Secondary | ICD-10-CM | POA: Diagnosis not present

## 2022-02-21 DIAGNOSIS — I5042 Chronic combined systolic (congestive) and diastolic (congestive) heart failure: Secondary | ICD-10-CM | POA: Diagnosis not present

## 2022-02-21 DIAGNOSIS — E11621 Type 2 diabetes mellitus with foot ulcer: Secondary | ICD-10-CM | POA: Diagnosis not present

## 2022-02-21 DIAGNOSIS — N186 End stage renal disease: Secondary | ICD-10-CM | POA: Diagnosis not present

## 2022-02-21 DIAGNOSIS — L97524 Non-pressure chronic ulcer of other part of left foot with necrosis of bone: Secondary | ICD-10-CM | POA: Diagnosis not present

## 2022-02-21 DIAGNOSIS — N2581 Secondary hyperparathyroidism of renal origin: Secondary | ICD-10-CM | POA: Diagnosis not present

## 2022-02-21 DIAGNOSIS — I2510 Atherosclerotic heart disease of native coronary artery without angina pectoris: Secondary | ICD-10-CM | POA: Diagnosis not present

## 2022-02-21 DIAGNOSIS — I110 Hypertensive heart disease with heart failure: Secondary | ICD-10-CM | POA: Diagnosis not present

## 2022-02-21 DIAGNOSIS — I96 Gangrene, not elsewhere classified: Secondary | ICD-10-CM | POA: Diagnosis not present

## 2022-02-21 DIAGNOSIS — L02612 Cutaneous abscess of left foot: Secondary | ICD-10-CM | POA: Diagnosis not present

## 2022-02-21 DIAGNOSIS — D631 Anemia in chronic kidney disease: Secondary | ICD-10-CM | POA: Diagnosis not present

## 2022-02-21 LAB — GLUCOSE, CAPILLARY
Glucose-Capillary: 110 mg/dL — ABNORMAL HIGH (ref 70–99)
Glucose-Capillary: 155 mg/dL — ABNORMAL HIGH (ref 70–99)

## 2022-02-21 NOTE — Progress Notes (Signed)
[place], [place] ([id number]) ?Visit Report for 02/20/2022 ?Problem List Details ?Patient Name: [name], [name] ?Date of Service: 02/20/2022 8:00 AM ?Medical Record Number: [id number] ?Patient Account Number: [id number] ?Date of Birth/Sex: 11/02/1965 (56 y.o. 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/31/2021 No Yes ?bone ?G53.646 Other chronic osteomyelitis, left ankle and foot 10/31/2021 No Yes ?I10 Essential (primary) hypertension 10/31/2021 No Yes ?I25.10 Atherosclerotic heart disease of native coronary artery without angina 10/31/2021 No Yes ?pectoris ?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 ?Date of Service: 02/20/2022 ?Medical Record Number: [id number] ?Patient Account Number: [id number] ?Date of Birth/Sex: 24-Feb-1966 (56 y.o. M) ?Treating RN: [name] ?Primary Care Provider: [name] ?Other Clinician: Jacqulyn Bath ?Referring Provider: [name] ?Treating Provider/Extender: Jeri Cos ?Weeks in Treatment: 16 ?Diagnosis Coding ?ICD-10 Codes ?Code Description ?E11.621 Type 2 diabetes mellitus with foot ulcer ?L97.524 Non-pressure chronic ulcer of other part of left foot with  necrosis of bone ?G89.169 Other chronic osteomyelitis, left ankle and foot ?I10 Essential (primary) hypertension ?I25.10 Atherosclerotic heart disease of native coronary artery without angina pectoris ?I50.42 Chronic combined systolic (congestive) and diastolic (congestive) heart failure ?R26.89 Other abnormalities of gait and mobility ?Facility Procedures ?CPT4 Code: 45038882 ?Description: (Facility Use Only) HBOT, full body chamber, 38min ?Modifier: ?Quantity: 4 ?Physician Procedures ?CPT4 Code: 8003491 ?Description: 99183 - WC PHYS HYPERBARIC OXYGEN THERAPY ?Modifier: ?Quantity: 1 ?CPT4 Code: ?Description: ICD-10 Diagnosis Description [phone] Other chronic osteomyelitis, left ankle and foot 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

## 2022-02-21 NOTE — Progress Notes (Addendum)
[place], [place] ([id number]) ?Visit Report for 02/21/2022 ?Chief Complaint Document Details ?Patient Name: [name], [name] ?Date of Service: 02/21/2022 1:15 PM ?Medical Record Number: [id number] ?Patient Account Number: [id number] ?Date of Birth/Sex: 1966/04/22 (56 y.o. M) ?Treating RN: Cornell Barman ?Primary Care Provider: [name] Other Clinician: ?Referring Provider: [name] ?Treating Provider/Extender: Jeri Cos ?Weeks in Treatment: 16 ?Information Obtained from: Patient ?Chief Complaint ?HBO evaluation for treatment on the left foot diabetic ulcer with osteomyelitis. Patient is currently under our care as well for his foot. ?Electronic Signature(s) ?Signed: 02/21/2022 1:11:56 PM By: Worthy Keeler PA-C ?Entered By: Worthy Keeler on 02/21/2022 13:11:56 ?[place], [place] ([id number]) ?-------------------------------------------------------------------------------- ?Debridement Details ?Patient Name: [name], [name] ?Date of Service: 02/21/2022 1:15 PM ?Medical Record Number: [id number] ?Patient Account Number: [id number] ?Date of Birth/Sex: 02-Apr-1966 (56 y.o. M) ?Treating RN: Cornell Barman ?Primary Care Provider: [name] Other Clinician: ?Referring Provider: [name] ?Treating Provider/Extender: Jeri Cos ?Weeks in Treatment: 16 ?Debridement Performed for ?Wound #2 Left,Lateral Foot ?Assessment: ?Performed By: Physician Tommie Sams., PA-C ?Debridement Type: Debridement ?Severity of Tissue Pre Debridement: Necrosis of bone ?Level of Consciousness (Pre- ?Awake and Alert ?procedure): ?Pre-procedure Verification/Time Out ?Yes - 14:01 ?Taken: ?Total Area Debrided (L x W): 4.2 (cm) x 2 (cm) = 8.4 (cm?) ?Tissue and other material ?Viable, Non-Viable, Bone, Slough, Subcutaneous, Slough ?debrided: ?Level: Skin/Subcutaneous Tissue/Muscle/Bone ?Debridement Description: Excisional ?Instrument: Curette ?Bleeding: Minimum ?Hemostasis Achieved: Pressure ?Response to Treatment: Procedure was tolerated well ?Level of  Consciousness (Post- ?Awake and Alert ?procedure): ?Post Debridement Measurements of Total Wound ?Length: (cm) 4.2 ?Width: (cm) 2 ?Depth: (cm) 0.8 ?Volume: (cm?) 5.278 ?Character of Wound/Ulcer Post Debridement: Stable ?Severity of Tissue Post Debridement: Necrosis of bone ?Post Procedure Diagnosis ?Same as Pre-procedure ?Electronic Signature(s) ?Signed: 02/21/2022 4:31:29 PM By: Gretta Cool, BSN, RN, CWS, Kim RN, BSN ?Signed: 02/21/2022 4:41:50 PM By: Worthy Keeler PA-C ?Entered By: Gretta Cool, BSN, RN, CWS, Kim on 02/21/2022 14:02:51 ?[place], [place] ([id number]) ?-------------------------------------------------------------------------------- ?HPI Details ?Patient Name: [name], [name] ?Date of Service: 02/21/2022 1:15 PM ?Medical Record Number: [id number] ?Patient Account Number: [id number] ?Date of Birth/Sex: May 28, 1966 (56 y.o. M) ?Treating RN: Cornell Barman ?Primary Care Provider: [name] Other Clinician: ?Referring Provider: [name] ?Treating Provider/Extender: Jeri Cos ?Weeks in Treatment: 16 ?History of Present Illness ?HPI Description: 10/31/2021 patient presents today for evaluation here in our clinic. He is actually being evaluated for hyperbaric oxygen therapy ?only. He has been seen at [facility] up to this point he is under the care of the vascular surgery specialty. Subsequently he is also ?under the care of the hyperbaric center there. With that being said that due to the fact that he is a dialysis patient he was actually recommended ?to be transferred to [place] for his treatments he actually lives in [place] which is where his dialysis is. It was a hardship for him to try to get from ?[place] especially on dialysis days all the way to [facility] get into the facility for his treatment and get back home he was literally spending ?all day long. He has had just 4 treatments there at [facility] thus far based on what I see in these have not been continued while he awaits approval ?here in our clinic.  Subsequently I do have records for review that will be included in the HPI predetermination review and attached to this chart as ?well. I will not duplicate that here. 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 Nonetheless he continues to have exposed bone with ?marked soft tissue swelling compatible with osteomyelitis. This is due to the irregularity of the head of the metatarsal as well. Nonetheless along ?with having associated cellulitis he is also good to be seen by infectious disease and is currently on antibiotics for this as well. Again that will be ?detailed in the pretreatment review section which I will attach to this note as well. He does currently have a wound VAC in place and again the ?wound was not evaluated here in the clinic as that is being completely managed by home health and [facility]. ?The patient does have a history of diabetes mellitus type 2, hypertension, coronary artery disease, and congestive heart failure. He also has ?cataracts of both eyes but no evidence of glaucoma on his most recent eye exam. ?11/22/2021 we have been seeing this gentleman for hyperbaric oxygen therapy but have not actually evaluated his wound up to this point this was ?being managed by the wound care and vascular team I presumed at [place] at least that is what has been told to be previous. Nonetheless at this ?point I got a call from Atlee Abide who is a Librarian, academic at the [facility] vascular clinic with Dr. Durene Fruits and subsequently they were wanting ?to know if I could take over wound care for this patient. Apparently they do not have the ability for an office debridement which again I completely ?understand but nonetheless definitely is not an integral part of his healing along with the hyperbarics. Subsequently I discussed with the patient ?today that I  do believe he would benefit going ahead and proceeding with evaluation here in the clinic for that reason I did go ahead and see him ?today to get things started. There is also been some confusion about the issues with his home health nurse and getting measurements to [facility] for ?the wound VAC in order to continue his wound VAC therapy. With that being said after I see him today we will make a determination on what to ?do with wound VAC we can definitely send measurements to [facility] but again the bigger question is whether this is the best way to go or not. ?11/28/2021 upon evaluation today patient appears to be doing decently well in regard to his wound. He has been tolerating the dressing changes ?with the Dakin's moistened gauze dressing which I think is doing a good job. Fortunately I do not see any signs of active infection locally nor ?systemically at this point. In general I think that he is actually making excellent progress which is great news. ?2/17; seen in conjunction with HBO today. 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 Much improved condition ?of the wound surface. On the lateral foot and eschared area that he has been using Betadine. Tolerating HBO well ?12/13/2021 upon evaluation today patient appears to be doing pretty well in regard to his foot ulcer. I am actually much more pleased currently ?that I been with the way the appearance looks today. The wound bed does show some signs of slough and biofilm on the surface of the wound ?this is minimal I Minna perform debridement the I will do so very carefully due to the fact that to be honest the patient had a significant amount of ?bleeding in the past being on the Bril[place] he had a  difficult time clotting when I debrided him previously quite significantly. Fortunately I do not ?see any signs of active infection at this time which is great news. No fevers, chills, nausea, vomiting, or diarrhea. ?12/20/2021 upon evaluation  today patient actually appears to be showing some signs of excellent improvement here. I am very pleased with where ?things stand currently. There does not appear to be any evidence of active infection local

## 2022-02-21 NOTE — Progress Notes (Signed)
[place], [place] ([id number]) ?Visit Report for 02/20/2022 ?HBO Details ?Patient Name: [name], [name] ?Date of Service: 02/20/2022 8:00 AM ?Medical Record Number: [id number] ?Patient Account Number: [id number] ?Date of Birth/Sex: May 28, 1966 (56 y.o. 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 ?HBO Treatment Details ?Treatment Number: 53 ?Patient Type: Outpatient ?Chamber Type: Monoplace ?Chamber Serial #: [id number] ?Treatment Protocol: 2.0 ATA with 90 minutes oxygen, and no air breaks ?Treatment Details ?Compression Rate Down: 1.5 psi / minute ?De-Compression Rate Up: 1.5 psi / minute ?Compress Tx Pressure Air breaks and breathing periods Decompress Decompress ?Begins Reached (leave unused spaces blank) Begins Ends ?Chamber Pressure (ATA) 1 2 - - - - - - 2 1 ?Clock Time (24 hr) 08:14 08:26 - - - - - - 09:56 10:06 ?Treatment Length: 112 (minutes) ?Treatment Segments: 4 ?Vital Signs ?Capillary Blood Glucose Reference Range: 80 - 120 mg / dl ?HBO Diabetic Blood Glucose Intervention Range: <131 mg/dl or >249 mg/dl ?Time Vitals Blood Respiratory Capillary Blood Glucose Pulse Action ?Type: ?Pulse: Temperature: ?Taken: ?Pressure: ?Rate: ?Glucose (mg/dl): Meter #: Oximetry (%) Taken: ?Pre 08:00 132/68 72 16 98.8 119 1 Patient had just eaten ?Post 10:15 128/68 66 16 97.9 135 1 none per protocol ?Treatment Response ?Treatment Toleration: Well ?Treatment Completion ?Treatment Completed without Adverse Event ?Status: ?HBO Attestation ?I certify that I supervised this HBO treatment in accordance with ?Medicare guidelines. A trained emergency response team is readily  Yes ?available per hospital policies and procedures. ?Continue HBOT as ordered. Yes ?Electronic Signature(s) ?Signed: 02/20/2022 5:49:32 PM By: Worthy Keeler PA-C ?Previous Signature: 02/20/2022 1:10:16 PM Version By: Enedina Finner RCP, RRT, CHT ?Entered By: Worthy Keeler on 02/20/2022 17:49:31 ?[place], [place] ([id number]) ?-------------------------------------------------------------------------------- ?HBO Safety Checklist Details ?Patient Name: [name], [name] ?Date of Service: 02/20/2022 8:00 AM ?Medical Record Number: [id number] ?Patient Account Number: [id number] ?Date of Birth/Sex: Aug 21, 1966 (56 y.o. M) ?Treating RN: [name] ?Primary Care Jaidah Lomax: [name] ?Other Clinician: Jacqulyn Bath ?Referring Sherman Lipuma: [name] ?Treating Caiden Arteaga/Extender: Jeri Cos ?Weeks in Treatment: 16 ?HBO Safety Checklist Items ?Safety Checklist ?Consent Form Signed ?Patient voided / foley secured and emptied ?When did you last eato 07:00 am ?Last dose of injectable or oral agent n/a ?Ostomy pouch emptied and vented if applicable ?NA ?All implantable devices assessed, documented and approved ?NA ?Intravenous access site secured and place ?NA ?Valuables secured ?Linens and cotton and cotton/polyester blend (less than 51% polyester) ?Personal oil-based products / skin lotions / body lotions removed ?Wigs or hairpieces removed ?NA ?Smoking or tobacco materials removed ?NA ?Books / newspapers / magazines / loose paper removed ?NA ?Cologne, aftershave, perfume and deodorant removed ?Jewelry removed (may wrap wedding band) ?Make-up removed ?NA ?Hair care products removed ?Battery operated devices (external) removed ?NA ?Heating patches and chemical warmers removed ?NA ?Titanium eyewear removed ?NA ?Nail polish cured greater than 10 hours ?NA ?Casting material cured greater than 10 hours ?NA ?Hearing aids removed ?NA ?Loose dentures or partials removed ?NA ?Prosthetics have been removed ?NA ?Patient  demonstrates correct use of air break device (if applicable) ?Patient concerns have been addressed ?Patient grounding bracelet on and cord attached to chamber ?Specifics for Inpatients (complete in addition to ?above) ?Medication sheet sent with patient ?Intravenous medications needed  or due during therapy sent with patient ?Drainage tubes (e.g. nasogastric tube or chest tube secured and vented) ?Endotracheal or Tracheotomy tube secured ?Cuff deflated of air and inflated with saline ?Airway suctioned ?Electronic Signature(s) ?Signed: 02/20/2022 1:10:16 PM By: Enedina Finner RCP, RRT, CHT ?Entered By: Enedina Finner on 02/20/2022 08:33:15 ?

## 2022-02-21 NOTE — Progress Notes (Signed)
[place], [place] ([id number]) ?Visit Report for 02/21/2022 ?Problem List Details ?Patient Name: [name], [name] ?Date of Service: 02/21/2022 11:00 AM ?Medical Record Number: [id number] ?Patient Account Number: [id number] ?Date of Birth/Sex: 08/13/1966 (56 y.o. 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/31/2021 No Yes ?bone ?T34.287 Other chronic osteomyelitis, left ankle and foot 10/31/2021 No Yes ?I10 Essential (primary) hypertension 10/31/2021 No Yes ?I25.10 Atherosclerotic heart disease of native coronary artery without angina 10/31/2021 No Yes ?pectoris ?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 ?Date of Service: 02/21/2022 ?Medical Record Number: [id number] ?Patient Account Number: [id number] ?Date of Birth/Sex: 10/09/1966 (56 y.o. M) ?Treating RN: Cornell Barman ?Primary Care Provider: [name] ?Other Clinician: Jacqulyn Bath ?Referring Provider: [name] ?Treating Provider/Extender: Jeri Cos ?Weeks in Treatment: 16 ?Diagnosis Coding ?ICD-10 Codes ?Code Description ?E11.621 Type 2 diabetes mellitus with foot ulcer ?L97.524 Non-pressure chronic ulcer of other part of left foot with  necrosis of bone ?L84.536 Other chronic osteomyelitis, left ankle and foot ?I10 Essential (primary) hypertension ?I25.10 Atherosclerotic heart disease of native coronary artery without angina pectoris ?I50.42 Chronic combined systolic (congestive) and diastolic (congestive) heart failure ?R26.89 Other abnormalities of gait and mobility ?Facility Procedures ?CPT4 Code: 46803212 ?Description: (Facility Use Only) HBOT, full body chamber, 58min ?Modifier: ?Quantity: 4 ?Physician Procedures ?CPT4 Code: 2482500 ?Description: 99183 - WC PHYS HYPERBARIC OXYGEN THERAPY ?Modifier: ?Quantity: 1 ?CPT4 Code: ?Description: ICD-10 Diagnosis Description [phone] Other chronic osteomyelitis, left ankle and foot 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

## 2022-02-21 NOTE — Progress Notes (Signed)
[place], [place] ([id number]) ?Visit Report for 02/21/2022 ?HBO Details ?Patient Name: [name], [name] ?Date of Service: 02/21/2022 11:00 AM ?Medical Record Number: [id number] ?Patient Account Number: [id number] ?Date of Birth/Sex: 06-08-1966 (56 y.o. M) ?Treating RN: Cornell Barman ?Primary Care Cuahut[place]oc Attar: [name] ?Other Clinician: Jacqulyn Bath ?Referring Huxley Vanwagoner: [name] ?Treating Soha Thorup/Extender: Jeri Cos ?Weeks in Treatment: 16 ?HBO Treatment Course Details ?Treatment Course Number: 1 ?Ordering Armonte Tortorella: Jeri Cos ?Total Treatments Ordered: 80 ?HBO Treatment Start Date: 11/04/2021 ?HBO Indication: ?Chronic Refractory Osteomyelitis to Left Foot ?HBO Treatment Details ?Treatment Number: 72 ?Patient Type: Outpatient ?Chamber Type: Monoplace ?Chamber Serial #: [id number] ?Treatment Protocol: 2.0 ATA with 90 minutes oxygen, and no air breaks ?Treatment Details ?Compression Rate Down: 1.0 psi / minute ?De-Compression Rate Up: 1.5 psi / minute ?Compress Tx Pressure Air breaks and breathing periods Decompress Decompress ?Begins Reached (leave unused spaces blank) Begins Ends ?Chamber Pressure (ATA) 1 2 - - - - - - 2 1 ?Clock Time (24 hr) 10:55 11:12 - - - - - - 12:42 12:52 ?Treatment Length: 117 (minutes) ?Treatment Segments: 4 ?Vital Signs ?Capillary Blood Glucose Reference Range: 80 - 120 mg / dl ?HBO Diabetic Blood Glucose Intervention Range: <131 mg/dl or >249 mg/dl ?Time Vitals Blood Respiratory Capillary Blood Glucose Pulse Action ?Type: ?Pulse: Temperature: ?Taken: ?Pressure: ?Rate: ?Glucose (mg/dl): Meter #: Oximetry (%) Taken: ?Pre 10:34 132/76 72 16 98.4 110 1 patient had just eaten ?Post 13:02 130/70 72 16 98.5 155 1 none per protocol ?Treatment Response ?Treatment Toleration: Well ?Treatment Completion ?Treatment Completed without Adverse Event ?Status: ?HBO Attestation ?I certify that I supervised this HBO treatment in accordance with ?Medicare guidelines. A trained emergency response team is readily  Yes ?available per hospital policies and procedures. ?Continue HBOT as ordered. Yes ?Electronic Signature(s) ?Signed: 02/21/2022 4:40:39 PM By: Worthy Keeler PA-C ?Previous Signature: 02/21/2022 2:13:05 PM Version By: Enedina Finner RCP, RRT, CHT ?Entered By: Worthy Keeler on 02/21/2022 16:40:38 ?[place], [place] ([id number]) ?-------------------------------------------------------------------------------- ?HBO Safety Checklist Details ?Patient Name: [name], [name] ?Date of Service: 02/21/2022 11:00 AM ?Medical Record Number: [id number] ?Patient Account Number: [id number] ?Date of Birth/Sex: 10/19/1966 (56 y.o. M) ?Treating RN: Cornell Barman ?Primary Care Drayke Grabel: [name] ?Other Clinician: Jacqulyn Bath ?Referring Doyce Saling: [name] ?Treating Kamorie Aldous/Extender: Jeri Cos ?Weeks in Treatment: 16 ?HBO Safety Checklist Items ?Safety Checklist ?Consent Form Signed ?Patient voided / foley secured and emptied ?When did you last eato 10:00 am ?Last dose of injectable or oral agent n/a ?Ostomy pouch emptied and vented if applicable ?NA ?All implantable devices assessed, documented and approved ?NA ?Intravenous access site secured and place ?NA ?Valuables secured ?Linens and cotton and cotton/polyester blend (less than 51% polyester) ?Personal oil-based products / skin lotions / body lotions removed ?Wigs or hairpieces removed ?NA ?Smoking or tobacco materials removed ?NA ?Books / newspapers / magazines / loose paper removed ?NA ?Cologne, aftershave, perfume and deodorant removed ?Jewelry removed (may wrap wedding band) ?Make-up removed ?NA ?Hair care products removed ?Battery operated devices (external) removed ?NA ?Heating patches and chemical warmers removed ?NA ?Titanium eyewear removed ?NA ?Nail polish cured greater than 10 hours ?NA ?Casting material cured greater than 10 hours ?NA ?Hearing aids removed ?NA ?Loose dentures or partials removed ?NA ?Prosthetics have been removed ?NA ?Patient  demonstrates correct use of air break device (if applicable) ?Patient concerns have been addressed ?Patient grounding bracelet on and cord attached to chamber ?Specifics for Inpatients (complete in addition to ?above) ?Medication sheet sent with patient ?Intravenous medications needed  or due during therapy sent with patient ?Drainage tubes (e.g. nasogastric tube or chest tube secured and vented) ?Endotracheal or Tracheotomy tube secured ?Cuff deflated of air and inflated with saline ?Airway suctioned ?Electronic Signature(s) ?Signed: 02/21/2022 2:13:05 PM By: Enedina Finner RCP, RRT, CHT ?Entered By: Enedina Finner on 02/21/2022 11:16:37 ?

## 2022-02-21 NOTE — Progress Notes (Addendum)
[place], [place] ([id number]) ?Visit Report for 02/21/2022 ?Arrival Information Details ?Patient Name: Mcenaney, [name] ?Date of Service: 02/21/2022 1:15 PM ?Medical Record Number: [id number] ?Patient Account Number: [id number] ?Date of Birth/Sex: 08/01/1966 (56 y.o. M) ?Treating RN: Cornell Barman ?Primary Care Lailana Shira: [name] Other Clinician: ?Referring Giani Winther: [name] ?Treating Evangelyn Crouse/Extender: Jeri Cos ?Weeks in Treatment: 16 ?Visit Information History Since Last Visit ?Added or deleted any medications: No ?Patient Arrived: Ambulatory ?Has Dressing in Place as Prescribed: Yes ?Arrival Time: 13:42 ?Has Footwear/Offloading in Place as Prescribed: Yes ?Transfer Assistance: None ?Left: Other:defender boot ?Patient Identification Verified: Yes ?Pain Present Now: No ?Secondary Verification Process Completed: Yes ?Patient Requires Transmission-Based No ?Precautions: ?Patient Has Alerts: Yes ?Patient Alerts: Patient on Blood ?Thinner ?DIALYSIS RIGHT ARM ?Aspirin 81/Brilinta ?Electronic Signature(s) ?Signed: 02/21/2022 4:31:29 PM By: Gretta Cool, BSN, RN, CWS, Kim RN, BSN ?Entered By: Gretta Cool, BSN, RN, CWS, Kim on 02/21/2022 13:44:16 ?[place], [place] ([id number]) ?-------------------------------------------------------------------------------- ?Encounter Discharge Information Details ?Patient Name: [name], [name] ?Date of Service: 02/21/2022 1:15 PM ?Medical Record Number: [id number] ?Patient Account Number: [id number] ?Date of Birth/Sex: [birth year] (56 y.o. M) ?Treating RN: Cornell Barman ?Primary Care Tamyra Fojtik: [name] Other Clinician: ?Referring Uma Jerde: [name] ?Treating Zeta Bucy/Extender: Jeri Cos ?Weeks in Treatment: 16 ?Encounter Discharge Information Items Post Procedure Vitals ?Discharge Condition: Stable ?Temperature (?F): 98.5 ?Ambulatory Status: Ambulatory ?Pulse (bpm): 72 ?Discharge Destination: Home ?Respiratory Rate (breaths/min): 16 ?Transportation: Private Auto ?Blood Pressure (mmHg):  130/70 ?Schedule Follow-up Appointment: Yes ?Clinical Summary of Care: ?Electronic Signature(s) ?Signed: 02/21/2022 4:31:29 PM By: Gretta Cool, BSN, RN, CWS, Kim RN, BSN ?Entered By: Gretta Cool, BSN, RN, CWS, Kim on 02/21/2022 14:10:09 ?Yann[place], [place] ([id number]) ?-------------------------------------------------------------------------------- ?Lower Extremity Assessment Details ?Patient Name: [name], [name] ?Date of Service: 02/21/2022 1:15 PM ?Medical Record Number: [id number] ?Patient Account Number: [id number] ?Date of Birth/Sex: [birth year] (56 y.o. M) ?Treating RN: Cornell Barman ?Primary Care Ronita Hargreaves: [name] Other Clinician: ?Referring Tamberlyn Midgley: [name] ?Treating Yarely Bebee/Extender: Jeri Cos ?Weeks in Treatment: 16 ?Edema Assessment ?Assessed: [Left: Yes] [Right: No] ?Edema: [Left: N] [Right: o] ?Vascular Assessment ?Pulses: ?Dorsalis Pedis ?Palpable: [Left:Yes] ?Electronic Signature(s) ?Signed: 02/21/2022 4:31:29 PM By: Gretta Cool, BSN, RN, CWS, Kim RN, BSN ?Entered By: Gretta Cool, BSN, RN, CWS, Kim on 02/21/2022 13:53:13 ?[place], [place] ([id number]) ?-------------------------------------------------------------------------------- ?Multi Wound Chart Details ?Patient Name: [name], [name] ?Date of Service: 02/21/2022 1:15 PM ?Medical Record Number: [id number] ?Patient Account Number: [id number] ?Date of Birth/Sex: [birth year] (56 y.o. 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Volume (cm?) : 1.649 3.958 N/A ?% Reduction in Area: 83.30% -110.00% N/A ?% Reduction in Volume: 96.00% -1160.50% N/A ?Classification: Full Thickness With Exposed  Grade 2 N/A ?Support Structures ?Exudate Amount: Medium Medium N/A ?Exudate Type: Serous Serosanguineous N/A ?Exudate Color: amber red, brown N/A ?Granulation Amount: Medium (34-66%) Small (1-33%) N/A ?Granulation Quality: Red, Pink Red N/A ?Necrotic Amount: Medium (34-66%) Large (67-100%) N/A ?Necrotic Tissue: Adherent Flat RockExposed Structures: ?Fat Layer (Subcutaneous Tissue): Fat Layer (Subcutaneous Tissue): N/A ?Yes Yes ?Fascia: No ?Tendon: Yes ?Tendon: No ?Joint: Yes ?Muscle: No ?Fascia: No ?Joint: No ?Muscle: No ?Bone: No ?Bone: No ?Epithelialization: N/A None N/A ?Treatment Notes ?Electronic Signature(s) ?Truett, E. Lopez ([id number]) ?Signed: 02/21/2022 4:31:29 PM By: Gretta Cool, BSN, RN, CWS, Kim RN, BSN ?Entered By: Gretta Cool, BSN, RN, CWS, Kim on 02/21/2022 13:57:56 ?[place], [place] ([id number]) ?-------------------------------------------------------------------------------- ?Multi-Disciplinary Care Plan Details ?Patient Name: [name], [name] ?Date of Service: 02/21/2022 1:15 PM ?Medical Record Number: [id number] ?Patient Account Number: [id number] ?Date of Birth/Sex: April 15, 1966 (56 y.o. M) ?Treating RN: Cornell Barman ?Primary Care  Rocky Gladden: [name] Other Clinician: ?Referring Celeste Candelas: [name] ?Treating Simi Briel/Extender: Jeri Cos ?Weeks in Treatment: 16 ?Active Inactive ?HBO ?Nursing Diagnoses: ?Anxiety related to feelings of confinement associated with the hyperbaric oxygen chamber ?Anxiety related to knowledge deficit of hyperbaric oxygen therapy and treatment procedures ?Discomfort related to temperature and humidity changes inside hyperbaric chamber ?Potential for barotraumas to ears, sinuses, teeth, and lungs or cerebral gas embolism related to changes in atmospheric pressure inside hyperbaric ?oxygen chamber ?Potential for oxygen toxicity seizures related to delivery of 100% oxygen at an increased atmospheric pressure ?Potential for pulmonary oxygen toxicity related to delivery of 100% oxygen at an increased atmospheric pressure ?Goals: ?Barotrauma will be prevented during HBO2 ?Date Initiated: 10/31/2021 ?Target Resolution Date: 12/06/2021 ?Goal Status: Active ?Patient and/or family will be able to state/discuss factors appropriate to the management of their disease process ?during treatment ?Date Initiated: 10/31/2021 ?Date Inactivated: 02/14/2022 ?Target Resolution Date: 11/15/2021 ?Goal Status: Met ?Patient will tolerate the hyperbaric oxygen therapy treatment ?Date Initiated: 10/31/2021 ?Target Resolution Date: 12/06/2021 ?Goal Status: Active ?Patient will tolerate the internal climate of the chamber ?Date Initiated: 10/31/2021 ?Date Inactivated: 02/14/2022 ?Target Resolution Date: 11/29/2021 ?Goal Status: Met ?Patient/caregiver will verbalize understanding of HBO goals, rationale, procedures and potential hazards ?Date Initiated: 10/31/2021 ?Date Inactivated: 02/14/2022 ?Target Resolution Date: 11/19/2021 ?Goal Status: Met ?Signs and symptoms of pulmonary oxygen toxicity will be recognized and promptly addressed ?Date Initiated: 10/31/2021 ?Date Inactivated: 02/14/2022 ?Target Resolution Date: 12/13/2021 ?Goal Status: Met ?Signs  and symptoms of seizure will be recognized and promptly addressed ; seizing patients will suffer no harm ?Date Initiated: 10/31/2021 ?Target Resolution Date: 12/20/2021 ?Goal Status: Active ?Interventions: ?Administe

## 2022-02-21 NOTE — Progress Notes (Signed)
[name], Ray ([id number]) ?Visit Report for 02/21/2022 ?Arrival Information Details ?Patient Name: [name], [name] ?Date of Service: 02/21/2022 11:00 AM ?Medical Record Number: [id number] ?Patient Account Number: [id number] ?Date of Birth/Sex: 07-14-1966 (56 y.o. M) ?Treating RN: Cornell Barman ?Primary Care Asli Tokarski: [name] ?Other Clinician: Jacqulyn Bath ?Referring Daris Aristizabal: [name] ?Treating Temiloluwa Recchia/Extender: Jeri Cos ?Weeks in Treatment: 16 ?Visit Information History Since Last Visit ?Added or deleted any medications: No ?Patient Arrived: Ambulatory ?Any new allergies or adverse reactions: No ?Arrival Time: 10:20 ?Had a fall or experienced change in No ?Accompanied By: self ?activities of daily living that may affect ?Transfer Assistance: None ?risk of falls: ?Patient Identification Verified: Yes ?Signs or symptoms of abuse/neglect since last visito No ?Secondary Verification Process Completed: Yes ?Hospitalized since last visit: No ?Patient Requires Transmission-Based No ?Implantable device outside of the clinic excluding No ?Precautions: ?cellular tissue based products placed in the center ?Patient Has Alerts: Yes ?since last visit: ?Patient Alerts: Patient on Blood ?Has Dressing in Place as Prescribed: Yes ?Thinner ?Has Footwear/Offloading in Place as Prescribed: Yes ?DIALYSIS RIGHT ARM ?Left: Other:Defender Boot ?Aspirin 81/Brilinta ?Pain Present Now: No ?Electronic Signature(s) ?Signed: 02/21/2022 2:13:05 PM By: Enedina Finner RCP, RRT, CHT ?Entered By: Enedina Finner on 02/21/2022 11:14:53 ?[place], [place] ([id number]) ?-------------------------------------------------------------------------------- ?Encounter Discharge Information Details ?Patient Name: [name], [name] ?Date of Service: 02/21/2022 11:00 AM ?Medical Record Number: [id number] ?Patient Account Number: [id number] ?Date of Birth/Sex: November 11, 1965 (56 y.o. M) ?Treating RN: Cornell Barman ?Primary Care Alvey Brockel: [name] ?Other Clinician: Jacqulyn Bath ?Referring Vernon Maish: [name] ?Treating Corean Yoshimura/Extender: Jeri Cos ?Weeks in Treatment: 16 ?Encounter Discharge Information Items ?Discharge Condition: Stable ?Ambulatory Status: Ambulatory ?Discharge Destination: Home ?Transportation: Private Auto ?Accompanied By: self ?Schedule Follow-up Appointment: Yes ?Clinical Summary of Care: ?Notes ?Patient has an HBO treatment scheduled on 02/24/22 at 11:00 am. ?Electronic Signature(s) ?Signed: 02/21/2022 2:13:05 PM By: Enedina Finner RCP, RRT, CHT ?Entered By: Enedina Finner on 02/21/2022 14:12:21 ?[place], [place] ([id number]) ?-------------------------------------------------------------------------------- ?Vitals Details ?Patient Name: [name], [name] ?Date of Service: 02/21/2022 11:00 AM ?Medical Record Number: [id number] ?Patient Account Number: [id number] ?Date of Birth/Sex: June 04, 1966 (56 y.o. M) ?Treating RN: Cornell Barman ?Primary Care Carrieann Spielberg: [name] ?Other Clinician: Jacqulyn Bath ?Referring Arrayah Connors: [name] ?Treating Jairon Ripberger/Extender: Jeri Cos ?Weeks in Treatment: 16 ?Vital Signs ?Time Taken: 10:34 ?Temperature (??F): 98.4 ?Height (in): 70 ?Pulse (bpm): 72 ?Weight (lbs): 180.4 ?Respiratory Rate (breaths/min): 16 ?Body Mass Index (BMI): 25.9 ?Blood Pressure (mmHg): 132/76 ?Capillary Blood Glucose (mg/dl): 110 ?Reference Range: 80 - 120 mg / dl ?Electronic Signature(s) ?Signed: 02/21/2022 2:13:05 PM By: Enedina Finner RCP, RRT, CHT ?Entered By: Enedina Finner on 02/21/2022 11:15:33 ?

## 2022-02-24 ENCOUNTER — Encounter: Payer: Medicare Other | Admitting: Internal Medicine

## 2022-02-24 DIAGNOSIS — M86672 Other chronic osteomyelitis, left ankle and foot: Secondary | ICD-10-CM | POA: Diagnosis not present

## 2022-02-24 DIAGNOSIS — E11621 Type 2 diabetes mellitus with foot ulcer: Secondary | ICD-10-CM | POA: Diagnosis not present

## 2022-02-24 DIAGNOSIS — N2581 Secondary hyperparathyroidism of renal origin: Secondary | ICD-10-CM | POA: Diagnosis not present

## 2022-02-24 DIAGNOSIS — L02612 Cutaneous abscess of left foot: Secondary | ICD-10-CM | POA: Diagnosis not present

## 2022-02-24 DIAGNOSIS — L97524 Non-pressure chronic ulcer of other part of left foot with necrosis of bone: Secondary | ICD-10-CM | POA: Diagnosis not present

## 2022-02-24 DIAGNOSIS — I96 Gangrene, not elsewhere classified: Secondary | ICD-10-CM | POA: Diagnosis not present

## 2022-02-24 DIAGNOSIS — Z5181 Encounter for therapeutic drug level monitoring: Secondary | ICD-10-CM | POA: Diagnosis not present

## 2022-02-24 DIAGNOSIS — N186 End stage renal disease: Secondary | ICD-10-CM | POA: Diagnosis not present

## 2022-02-24 DIAGNOSIS — Z992 Dependence on renal dialysis: Secondary | ICD-10-CM | POA: Diagnosis not present

## 2022-02-24 DIAGNOSIS — I5042 Chronic combined systolic (congestive) and diastolic (congestive) heart failure: Secondary | ICD-10-CM | POA: Diagnosis not present

## 2022-02-24 DIAGNOSIS — I2510 Atherosclerotic heart disease of native coronary artery without angina pectoris: Secondary | ICD-10-CM | POA: Diagnosis not present

## 2022-02-24 DIAGNOSIS — D631 Anemia in chronic kidney disease: Secondary | ICD-10-CM | POA: Diagnosis not present

## 2022-02-24 DIAGNOSIS — I110 Hypertensive heart disease with heart failure: Secondary | ICD-10-CM | POA: Diagnosis not present

## 2022-02-24 LAB — GLUCOSE, CAPILLARY
Glucose-Capillary: 80 mg/dL (ref 70–99)
Glucose-Capillary: 98 mg/dL (ref 70–99)
Glucose-Capillary: 125 mg/dL — ABNORMAL HIGH (ref 70–99)

## 2022-02-24 NOTE — Progress Notes (Signed)
[place], [place] ([id number]) ?Visit Report for 02/24/2022 ?Arrival Information Details ?Patient Name: Elbert, Aloysious L. ?Date of Service: 02/24/2022 11:00 AM ?Medical Record Number: [id number] ?Patient Account Number: [id number] ?Date of Birth/Sex: 1966-03-15 (56 y.o. M) ?Treating RN: [name] ?Primary Care Ciclaly Mulcahey: [name] ?Other Clinician: Jacqulyn Bath ?Referring Freemon Binford: [name] ?Treating Chimene Salo/Extender: Ricard Dillon ?Weeks in Treatment: 16 ?Visit Information History Since Last Visit ?Added or deleted any medications: No ?Patient Arrived: Ambulatory ?Any new allergies or adverse reactions: No ?Arrival Time: 10:33 ?Had a fall or experienced change in No ?Accompanied By: self ?activities of daily living that may affect ?Transfer Assistance: None ?risk of falls: ?Patient Identification Verified: Yes ?Signs or symptoms of abuse/neglect since last visito No ?Secondary Verification Process Completed: Yes ?Hospitalized since last visit: No ?Patient Requires Transmission-Based No ?Implantable device outside of the clinic excluding No ?Precautions: ?cellular tissue based products placed in the center ?Patient Has Alerts: Yes ?since last visit: ?Patient Alerts: Patient on Blood ?Pain Present Now: No ?Thinner ?DIALYSIS RIGHT ARM ?Aspirin 81/Brilinta ?Electronic Signature(s) ?Signed: 02/24/2022 4:43:48 PM By: Enedina Finner RCP, RRT, CHT ?Entered By: Enedina Finner on 02/24/2022 10:58:18 ?[place], [place] ([id number]) ?-------------------------------------------------------------------------------- ?Encounter Discharge Information Details ?Patient Name: Brasel, Stirling L. ?Date of Service: 02/24/2022 11:00 AM ?Medical Record Number: [id number] ?Patient Account Number: [id number] ?Date of Birth/Sex: 1966-08-03 (56 y.o. M) ?Treating RN: [name] ?Primary Care Devere Brem: [name] ?Other Clinician: Jacqulyn Bath ?Referring [place]: [name] ?Treating Knut Rondinelli/Extender: Ricard Dillon ?Weeks in Treatment: 16 ?Encounter Discharge Information Items ?Discharge Condition: Stable ?Ambulatory Status: Ambulatory ?Discharge Destination: Home ?Transportation: Private Auto ?Accompanied By: self ?Schedule Follow-up Appointment: Yes ?Clinical Summary of Care: ?Notes ?Patient has an HBO treatment scheduled on 02/25/22 at 11:00 am. ?Electronic Signature(s) ?Signed: 02/24/2022 4:43:48 PM By: Enedina Finner RCP, RRT, CHT ?Entered By: Enedina Finner on 02/24/2022 13:38:41 ?[place], [place] ([id number]) ?-------------------------------------------------------------------------------- ?Vitals Details ?Patient Name: Timko, Rayaan L. ?Date of Service: 02/24/2022 11:00 AM ?Medical Record Number: [id number] ?Patient Account Number: [id number] ?Date of Birth/Sex: 1966/01/30 (56 y.o. M) ?Treating RN: [name] ?Primary Care Dorothy Landgrebe: [name] ?Other Clinician: Jacqulyn Bath ?Referring Shenita Trego: [name] ?Treating Shubham Thackston/Extender: Ricard Dillon ?Weeks in Treatment: 16 ?Vital Signs ?Time Taken: 10:33 ?Temperature (??F): 97.9 ?Height (in): 70 ?Pulse (bpm): 66 ?Weight (lbs): 180.4 ?Respiratory Rate (breaths/min): 16 ?Body Mass Index (BMI): 25.9 ?Blood Pressure (mmHg): 128/76 ?Capillary Blood Glucose (mg/dl): 80 ?Reference Range: 80 - 120 mg / dl ?Electronic Signature(s) ?Signed: 02/24/2022 4:43:48 PM By: Enedina Finner RCP, RRT, CHT ?Entered By: Enedina Finner on 02/24/2022 12:26:36 ?

## 2022-02-25 ENCOUNTER — Encounter: Payer: Medicare Other | Admitting: Physician Assistant

## 2022-02-25 DIAGNOSIS — I5042 Chronic combined systolic (congestive) and diastolic (congestive) heart failure: Secondary | ICD-10-CM | POA: Diagnosis not present

## 2022-02-25 DIAGNOSIS — L97524 Non-pressure chronic ulcer of other part of left foot with necrosis of bone: Secondary | ICD-10-CM | POA: Diagnosis not present

## 2022-02-25 DIAGNOSIS — M86672 Other chronic osteomyelitis, left ankle and foot: Secondary | ICD-10-CM | POA: Diagnosis not present

## 2022-02-25 DIAGNOSIS — E11621 Type 2 diabetes mellitus with foot ulcer: Secondary | ICD-10-CM | POA: Diagnosis not present

## 2022-02-25 DIAGNOSIS — I110 Hypertensive heart disease with heart failure: Secondary | ICD-10-CM | POA: Diagnosis not present

## 2022-02-25 DIAGNOSIS — I2510 Atherosclerotic heart disease of native coronary artery without angina pectoris: Secondary | ICD-10-CM | POA: Diagnosis not present

## 2022-02-25 LAB — GLUCOSE, CAPILLARY
Glucose-Capillary: 271 mg/dL — ABNORMAL HIGH (ref 70–99)
Glucose-Capillary: 104 mg/dL — ABNORMAL HIGH (ref 70–99)

## 2022-02-25 NOTE — Progress Notes (Signed)
[place], [place] ([id number]) ?Visit Report for 02/25/2022 ?Arrival Information Details ?Patient Name: [name], [name] ?Date of Service: 02/25/2022 11:00 AM ?Medical Record Number: [id number] ?Patient Account Number: [id number] ?Date of Birth/Sex: 1966-07-09 (56 y.o. M) ?Treating RN: [name] ?Primary Care Sabre Leonetti: [name] ?Other Clinician: Jacqulyn Bath ?Referring Zanaria Morell: [name] ?Treating Laqueena Hinchey/Extender: Jeri Cos ?Weeks in Treatment: 16 ?Visit Information History Since Last Visit ?Added or deleted any medications: No ?Patient Arrived: Ambulatory ?Any new allergies or adverse reactions: No ?Arrival Time: 09:50 ?Had a fall or experienced change in No ?Accompanied By: self ?activities of daily living that may affect ?Transfer Assistance: None ?risk of falls: ?Patient Identification Verified: Yes ?Signs or symptoms of abuse/neglect since last visito No ?Secondary Verification Process Completed: Yes ?Hospitalized since last visit: No ?Patient Requires Transmission-Based No ?Implantable device outside of the clinic excluding No ?Precautions: ?cellular tissue based products placed in the center ?Patient Has Alerts: Yes ?since last visit: ?Patient Alerts: Patient on Blood ?Has Dressing in Place as Prescribed: Yes ?Thinner ?Has Footwear/Offloading in Place as Prescribed: Yes ?DIALYSIS RIGHT ARM ?Left: Other:Defemder Boot ?Aspirin 81/Brilinta ?Pain Present Now: No ?Electronic Signature(s) ?Signed: 02/25/2022 1:22:39 PM By: Enedina Finner RCP, RRT, CHT ?Entered By: Enedina Finner on 02/25/2022 10:11:48 ?[place], [place] ([id number]) ?-------------------------------------------------------------------------------- ?Encounter Discharge Information Details ?Patient Name: [name], [name] ?Date of Service: 02/25/2022 11:00 AM ?Medical Record Number: [id number] ?Patient Account Number: [id number] ?Date of Birth/Sex: 12-28-1965 (56 y.o. M) ?Treating RN: [name] ?Primary Care Breylin Dom: [name] ?Other Clinician: Jacqulyn Bath ?Referring Chrystina Naff: [name] ?Treating Jilene Spohr/Extender: Jeri Cos ?Weeks in Treatment: 16 ?Encounter Discharge Information Items ?Discharge Condition: Stable ?Ambulatory Status: Ambulatory ?Discharge Destination: Home ?Transportation: Private Auto ?Accompanied By: self ?Schedule Follow-up Appointment: No ?Clinical Summary of Care: ?Notes ?Patient has an HBO treatment scheduled on 02/26/22 at 11:00 am. ?Electronic Signature(s) ?Signed: 02/25/2022 1:22:39 PM By: Enedina Finner RCP, RRT, CHT ?Entered By: Enedina Finner on 02/25/2022 13:22:22 ?[place], [name] ([id number]) ?-------------------------------------------------------------------------------- ?Vitals Details ?Patient Name: [name], [name] ?Date of Service: 02/25/2022 11:00 AM ?Medical Record Number: [id number] ?Patient Account Number: [id number] ?Date of Birth/Sex: 1965-10-27 (56 y.o. M) ?Treating RN: [name] ?Primary Care Synthia Fairbank: [name] ?Other Clinician: Jacqulyn Bath ?Referring Kerry Chisolm: [name] ?Treating Sean Malinowski/Extender: Jeri Cos ?Weeks in Treatment: 16 ?Vital Signs ?Time Taken: 09:52 ?Temperature (??F): 97.9 ?Height (in): 70 ?Pulse (bpm): 72 ?Weight (lbs): 180.4 ?Respiratory Rate (breaths/min): 16 ?Body Mass Index (BMI): 25.9 ?Blood Pressure (mmHg): 126/70 ?Capillary Blood Glucose (mg/dl): 271 ?Reference Range: 80 - 120 mg / dl ?Electronic Signature(s) ?Signed: 02/25/2022 1:22:39 PM By: Enedina Finner RCP, RRT, CHT ?Entered By: Enedina Finner on 02/25/2022 10:12:51 ?

## 2022-02-25 NOTE — Progress Notes (Signed)
[place], [place] ([id number]) ?Visit Report for 02/24/2022 ?HBO Details ?Patient Name: [name], [name] ?Date of Service: 02/24/2022 11:00 AM ?Medical Record Number: [id number] ?Patient Account Number: [id number] ?Date of Birth/Sex: Jun 07, 1966 (56 y.o. 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 ?HBO Treatment Details ?Treatment Number: 57 ?Patient Type: Outpatient ?Chamber Type: Monoplace ?Chamber Serial #: [id number] ?Treatment Protocol: 2.0 ATA with 90 minutes oxygen, and no air breaks ?Treatment Details ?Compression Rate Down: 1.0 psi / minute ?De-Compression Rate Up: 1.5 psi / minute ?Compress Tx Pressure Air breaks and breathing periods Decompress Decompress ?Begins Reached (leave unused spaces blank) Begins Ends ?Chamber Pressure (ATA) 1 2 - - - - - - 2 1 ?Clock Time (24 hr) 10:51 11:07 - - - - - - 12:37 12:47 ?Treatment Length: 116 (minutes) ?Treatment Segments: 4 ?Vital Signs ?Capillary Blood Glucose Reference Range: 80 - 120 mg / dl ?HBO Diabetic Blood Glucose Intervention Range: <131 mg/dl or >249 mg/dl ?Time Capillary Blood Glucose Pulse ?Blood Respiratory Action ?Type: Vitals ?Pulse: ?Temperature: Glucose Meter Oximetry ?Pressure: ?Rate: Taken: ?Taken: ?(mg/dl): ?#: (%) ?Pre 10:33 128/76 66 16 97.9 80 1 none as he had just eaten ?Post 12:51 126/68 72 16 98.4 125 1 none per protocol ?He had just eaten shortly before ?Pre 10:44 98 1 ?coming ?Treatment Response ?Treatment Toleration: Well ?Treatment Completion ?Treatment Completed without Adverse Event ?Status: ?Treatment Notes ?Patient takes no diabetes medication. He controls his blood  glucose totally by diet. He always eats about 45 minutes before he comes in for his ?treatments. ?Kariyah Baugh Notes ?No concerns with treatment given. . Before his diet his CBGs were in the 80s. He is not on any diabetic medication and has not been for some ?time. Staff reported that he often will present like this but then the blood sugar goes up as it did this time exiting at 115. He has no ill effects ?HBO Attestation ?I certify that I supervised this HBO treatment in accordance with ?Medicare guidelines. A trained emergency response team is readily Yes ?available per hospital policies and procedures. ?Continue HBOT as ordered. Yes ?Electronic Signature(s) ?Strauss, [place] ([id number]) ?Signed: 02/25/2022 6:42:33 AM By: Linton Ham MD ?Entered By: Linton Ham on 02/24/2022 16:29:55 ?Hiltz, [place] ([id number]) ?-------------------------------------------------------------------------------- ?HBO Safety Checklist Details ?Patient Name: [name], [name] ?Date of Service: 02/24/2022 11:00 AM ?Medical Record Number: [id number] ?Patient Account Number: [id number] ?Date of Birth/Sex: October 17, 1966 (56 y.o. M) ?Treating RN: [name] ?Primary Care Nerine Pulse: [name] ?Other Clinician: Jacqulyn Bath ?Referring Rosevelt Luu: [name] ?Treating Skyelar Halliday/Extender: Ricard Dillon ?Weeks in Treatment: 16 ?HBO Safety Checklist Items ?Safety Checklist ?Consent Form Signed ?Patient voided / foley secured and emptied ?When did you last eato 10:00 am ?Last dose of injectable or oral agent n/a ?Ostomy pouch emptied and vented if applicable ?NA ?All implantable devices assessed, documented and approved ?NA ?Intravenous access site secured and place ?NA ?Valuables secured ?Linens and cotton and cotton/polyester blend (less than 51% polyester) ?Personal oil-based products / skin lotions / body lotions removed ?Wigs or hairpieces removed ?NA ?Smoking or tobacco materials removed ?NA ?Books / newspapers / magazines / loose paper  removed ?NA ?Cologne, aftershave, perfume and deodorant removed ?Jewelry removed (may wrap wedding band) ?Make-up removed ?NA ?  Hair care products removed ?Battery operated devices (external) removed ?NA ?Heating patches and chemical warmers removed ?NA ?Titanium eyewear removed ?NA ?Nail polish cured greater than 10 hours ?NA ?Casting material cured greater than 10 hours ?NA ?Hearing aids removed ?NA ?Loose dentures or partials removed ?NA ?Prosthetics have been removed ?NA ?Patient demonstrates correct use of air break device (if applicable) ?Patient concerns have been addressed ?Patient grounding bracelet on and cord attached to chamber ?Specifics for Inpatients (complete in addition to ?above) ?Medication sheet sent with patient ?Intravenous medications needed or due during therapy sent with patient ?Drainage tubes (e.g. nasogastric tube or chest tube secured and vented) ?Endotracheal or Tracheotomy tube secured ?Cuff deflated of air and inflated with saline ?Airway suctioned ?Electronic Signature(s) ?Signed: 02/24/2022 4:43:48 PM By: Enedina Finner RCP, RRT, CHT ?Entered By: Enedina Finner on 02/24/2022 12:33:26 ?

## 2022-02-25 NOTE — Progress Notes (Signed)
Pina, [place] ([id number]) ?Visit Report for 02/25/2022 ?Problem List Details ?Patient Name: [name], [name] ?Date of Service: 02/25/2022 11:00 AM ?Medical Record Number: [id number] ?Patient Account Number: [id number] ?Date of Birth/Sex: [birth year] (56 y.o. 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 ?Date of Service: 02/25/2022 ?Medical Record Number: [id number] ?Patient Account Number: [id number] ?Date of Birth/Sex: [birth year] (56 y.o. 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 pectoris ?I50.42 Chronic combined systolic (congestive) and diastolic (congestive) heart failure ?R26.89 Other abnormalities of gait and mobility ?Facility Procedures ?CPT4 Code: 49826415 ?Description: (Facility Use Only) HBOT, full body chamber, 71min ?Modifier: ?Quantity: 4 ?Physician Procedures ?CPT4 Code: 8309407 ?Description: 99183 - WC PHYS HYPERBARIC OXYGEN THERAPY ?Modifier: ?Quantity: 1 ?CPT4 Code: ?Description: ICD-10 Diagnosis Description [phone] Other chronic osteomyelitis, left ankle and foot 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

## 2022-02-25 NOTE — Progress Notes (Signed)
Dagostino, [place] ([id number]) ?Visit Report for 02/25/2022 ?HBO Details ?Patient Name: [name], [name] ?Date of Service: 02/25/2022 11:00 AM ?Medical Record Number: [id number] ?Patient Account Number: [id number] ?Date of Birth/Sex: 09-10-1966 (56 y.o. 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Capillary Blood Glucose Reference Range: 80 - 120 mg / dl ?HBO Diabetic Blood Glucose Intervention Range: <131 mg/dl or >249 mg/dl ?Time Vitals Blood Respiratory Capillary Blood Glucose Pulse Action ?Type: ?Pulse: Temperature: ?Taken: ?Pressure: ?Rate: ?Glucose (mg/dl): ?Meter #: Oximetry (%) Taken: ?Pre 09:52 126/70 72 16 97.9 271 ?Post 12:44 128/74 72 16 98.4 104 1 none per protocol ?Treatment Response ?Treatment Toleration: Well ?Treatment Completion ?Treatment Completed without Adverse Event ?Status: ?HBO Attestation ?I certify that I supervised this HBO treatment in accordance with ?Medicare guidelines. A trained emergency response team is readily Yes ?available per  hospital policies and procedures. ?Continue HBOT as ordered. Yes ?Electronic Signature(s) ?Signed: 02/25/2022 5:20:48 PM By: Worthy Keeler PA-C ?Previous Signature: 02/25/2022 1:22:39 PM Version By: Enedina Finner RCP, RRT, CHT ?Entered By: Worthy Keeler on 02/25/2022 17:20:48 ?[place], [place] ([id number]) ?-------------------------------------------------------------------------------- ?HBO Safety Checklist Details ?Patient Name: [name], [name] ?Date of Service: 02/25/2022 11:00 AM ?Medical Record Number: [id number] ?Patient Account Number: [id number] ?Date of Birth/Sex: [birth year] (56 y.o. M) ?Treating RN: [name] ?Primary Care Eulises Kijowski: [name] ?Other Clinician: Jacqulyn Bath ?Referring Shadie Sweatman: [name] ?Treating Yomira Flitton/Extender: Jeri Cos ?Weeks in Treatment: 16 ?HBO Safety Checklist Items ?Safety Checklist ?Consent Form Signed ?Patient voided / foley secured and emptied ?When did you last eato 09:00 am ?Last dose of injectable or oral agent n/a ?Ostomy pouch emptied and vented if applicable ?NA ?All implantable devices assessed, documented and approved ?NA ?Intravenous access site secured and place ?NA ?Valuables secured ?Linens and cotton and cotton/polyester blend (less than 51% polyester) ?Personal oil-based products / skin lotions / body lotions removed ?Wigs or hairpieces removed ?NA ?Smoking or tobacco materials removed ?NA ?Books / newspapers / magazines / loose paper removed ?NA ?Cologne, aftershave, perfume and deodorant removed ?Jewelry removed (may wrap wedding band) ?Make-up removed ?NA ?Hair care products removed ?Battery operated devices (external) removed ?NA ?Heating patches and chemical warmers removed ?NA ?Titanium eyewear removed ?NA ?Nail polish cured greater than 10 hours ?NA ?Casting material cured greater than 10 hours ?NA ?Hearing aids removed ?NA ?Loose dentures or partials removed ?NA ?Prosthetics have been removed ?NA ?Patient demonstrates correct use of  air break device (if applicable) ?Patient concerns have been addressed ?Patient grounding bracelet on and cord attached to chamber ?Specifics for Inpatients (complete in addition to ?above) ?Medication sheet sent with patient ?Intravenous medications needed or due during therapy sent  with patient ?Drainage tubes (e.g. nasogastric tube or chest tube secured and vented) ?Endotracheal or Tracheotomy tube secured ?Cuff deflated of air and inflated with saline ?Airway suctioned ?Electronic Signature(s) ?Signed: 02/25/2022 1:22:39 PM By: Enedina Finner RCP, RRT, CHT ?Entered By: Enedina Finner on 02/25/2022 10:15:40 ?

## 2022-02-26 ENCOUNTER — Encounter (HOSPITAL_BASED_OUTPATIENT_CLINIC_OR_DEPARTMENT_OTHER): Payer: Medicare Other | Admitting: Internal Medicine

## 2022-02-26 DIAGNOSIS — I96 Gangrene, not elsewhere classified: Secondary | ICD-10-CM | POA: Diagnosis not present

## 2022-02-26 DIAGNOSIS — L97524 Non-pressure chronic ulcer of other part of left foot with necrosis of bone: Secondary | ICD-10-CM | POA: Diagnosis not present

## 2022-02-26 DIAGNOSIS — D631 Anemia in chronic kidney disease: Secondary | ICD-10-CM | POA: Diagnosis not present

## 2022-02-26 DIAGNOSIS — L02612 Cutaneous abscess of left foot: Secondary | ICD-10-CM | POA: Diagnosis not present

## 2022-02-26 DIAGNOSIS — M86672 Other chronic osteomyelitis, left ankle and foot: Secondary | ICD-10-CM

## 2022-02-26 DIAGNOSIS — N2581 Secondary hyperparathyroidism of renal origin: Secondary | ICD-10-CM | POA: Diagnosis not present

## 2022-02-26 DIAGNOSIS — N186 End stage renal disease: Secondary | ICD-10-CM | POA: Diagnosis not present

## 2022-02-26 DIAGNOSIS — I110 Hypertensive heart disease with heart failure: Secondary | ICD-10-CM | POA: Diagnosis not present

## 2022-02-26 DIAGNOSIS — Z992 Dependence on renal dialysis: Secondary | ICD-10-CM | POA: Diagnosis not present

## 2022-02-26 DIAGNOSIS — E11621 Type 2 diabetes mellitus with foot ulcer: Secondary | ICD-10-CM | POA: Diagnosis not present

## 2022-02-26 DIAGNOSIS — I5042 Chronic combined systolic (congestive) and diastolic (congestive) heart failure: Secondary | ICD-10-CM | POA: Diagnosis not present

## 2022-02-26 DIAGNOSIS — I2510 Atherosclerotic heart disease of native coronary artery without angina pectoris: Secondary | ICD-10-CM | POA: Diagnosis not present

## 2022-02-26 LAB — GLUCOSE, CAPILLARY: Glucose-Capillary: 115 mg/dL — ABNORMAL HIGH (ref 70–99)

## 2022-02-26 NOTE — Progress Notes (Signed)
Marker, [place] ([id number]) ?Visit Report for 02/26/2022 ?Arrival Information Details ?Patient Name: Graff, [name] ?Date of Service: 02/26/2022 11:00 AM ?Medical Record Number: [id number] ?Patient Account Number: [id number] ?Date of Birth/Sex: 28-Aug-1966 (56 y.o. M) ?Treating RN: [name] ?Primary Care Jonnatan Hanners: [name] ?Other Clinician: Jacqulyn Bath ?Referring Gean Larose: [name] ?Treating Shephanie Romas/Extender: Kalman Shan ?Weeks in Treatment: 16 ?Visit Information History Since Last Visit ?Added or deleted any medications: No ?Patient Arrived: Ambulatory ?Any new allergies or adverse reactions: No ?Arrival Time: 10:20 ?Had a fall or experienced change in No ?Accompanied By: self ?activities of daily living that may affect ?Transfer Assistance: None ?risk of falls: ?Patient Identification Verified: Yes ?Signs or symptoms of abuse/neglect since last visito No ?Secondary Verification Process Completed: Yes ?Hospitalized since last visit: No ?Patient Requires Transmission-Based No ?Implantable device outside of the clinic excluding No ?Precautions: ?cellular tissue based products placed in the center ?Patient Has Alerts: Yes ?since last visit: ?Patient Alerts: Patient on Blood ?Has Dressing in Place as Prescribed: Yes ?Thinner ?Has Footwear/Offloading in Place as Prescribed: Yes ?DIALYSIS RIGHT ARM ?Left: Other:Defender Boot ?Aspirin 81/Brilinta ?Pain Present Now: No ?Electronic Signature(s) ?Signed: 02/26/2022 1:56:57 PM By: Enedina Finner RCP, RRT, CHT ?Entered By: Enedina Finner on 02/26/2022 11:47:20 ?S[place], [place] ([id number]) ?-------------------------------------------------------------------------------- ?Encounter Discharge Information Details ?Patient Name: [name], [name] ?Date of Service: 02/26/2022 11:00 AM ?Medical Record Number: [id number] ?Patient Account Number: [id number] ?Date of Birth/Sex: [birth year] (56 y.o. M) ?Treating RN: [name] ?Primary Care Keylah Darwish:  [name] ?Other Clinician: Jacqulyn Bath ?Referring Rylinn Linzy: [name] ?Treating Gola Bribiesca/Extender: Kalman Shan ?Weeks in Treatment: 16 ?Encounter Discharge Information Items ?Discharge Condition: Stable ?Ambulatory Status: Ambulatory ?Discharge Destination: Home ?Transportation: Private Auto ?Accompanied By: self ?Schedule Follow-up Appointment: Yes ?Clinical Summary of Care: ?Notes ?Patient has an HBO treatment scheduled on 02/28/22 at 11:00 am. ?Electronic Signature(s) ?Signed: 02/26/2022 1:56:57 PM By: Enedina Finner RCP, RRT, CHT ?Entered By: Enedina Finner on 02/26/2022 13:56:23 ?[place], [place] ([id number]) ?-------------------------------------------------------------------------------- ?Vitals Details ?Patient Name: [name], [name] ?Date of Service: 02/26/2022 11:00 AM ?Medical Record Number: [id number] ?Patient Account Number: [id number] ?Date of Birth/Sex: 1966/08/03 (56 y.o. M) ?Treating RN: [name] ?Primary Care Jakeem Grape: [name] ?Other Clinician: Jacqulyn Bath ?Referring Tyriek Hofman: [name] ?Treating Carlynn Leduc/Extender: Kalman Shan ?Weeks in Treatment: 16 ?Vital Signs ?Time Taken: 10:20 ?Temperature (??F): 98.1 ?Height (in): 70 ?Pulse (bpm): 72 ?Weight (lbs): 180.4 ?Respiratory Rate (breaths/min): 16 ?Body Mass Index (BMI): 25.9 ?Blood Pressure (mmHg): 128/68 ?Capillary Blood Glucose (mg/dl): 117 ?Reference Range: 80 - 120 mg / dl ?Electronic Signature(s) ?Signed: 02/26/2022 1:56:57 PM By: Enedina Finner RCP, RRT, CHT ?Entered By: Enedina Finner on 02/26/2022 11:47:50 ?

## 2022-02-26 NOTE — Progress Notes (Signed)
Heggs, [place] ([id number]) ?Visit Report for 02/26/2022 ?HBO Details ?Patient Name: [name], [name] ?Date of Service: 02/26/2022 11:00 AM ?Medical Record Number: [id number] ?Patient Account Number: [id number] ?Date of Birth/Sex: 1965-11-04 (56 y.o. 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 ?HBO Treatment Details ?Treatment Number: 60 ?Patient Type: Outpatient ?Chamber Type: Monoplace ?Chamber Serial #: [id number] ?Treatment Protocol: 2.0 ATA with 90 minutes oxygen, and no air breaks ?Treatment Details ?Compression Rate Down: 1.5 psi / minute ?De-Compression Rate Up: 1.5 psi / minute ?Compress Tx Pressure Air breaks and breathing periods Decompress Decompress ?Begins Reached (leave unused spaces blank) Begins Ends ?Chamber Pressure (ATA) 1 2 - - - - - - 2 1 ?Clock Time (24 hr) 10:59 11:10 - - - - - - 12:40 12:50 ?Treatment Length: 111 (minutes) ?Treatment Segments: 4 ?Vital Signs ?Capillary Blood Glucose Reference Range: 80 - 120 mg / dl ?HBO Diabetic Blood Glucose Intervention Range: <131 mg/dl or >249 mg/dl ?Time Vitals Blood Respiratory Capillary Blood Glucose Pulse Action ?Type: ?Pulse: Temperature: ?Taken: ?Pressure: ?Rate: ?Glucose (mg/dl): Meter #: Oximetry (%) Taken: ?Pre 10:20 128/68 72 16 98.1 117 1 Patient had just eaten. ?Post 13:00 126/70 66 16 97.9 115 1 none per protocol ?Treatment Response ?Treatment Toleration: Well ?Treatment Completion ?Treatment Completed without Adverse Event ?Status: ?HBO Attestation ?I certify that I supervised this HBO treatment in accordance with ?Medicare guidelines. A trained emergency response team is  readily Yes ?available per hospital policies and procedures. ?Continue HBOT as ordered. Yes ?Electronic Signature(s) ?Signed: 02/26/2022 3:02:18 PM By: Kalman Shan DO ?Previous Signature: 02/26/2022 1:56:57 PM Version By: Enedina Finner RCP, RRT, CHT ?Entered By: Kalman Shan on 02/26/2022 15:01:45 ?Gesner, [place] ([id number]) ?-------------------------------------------------------------------------------- ?HBO Safety Checklist Details ?Patient Name: [name], [name] ?Date of Service: 02/26/2022 11:00 AM ?Medical Record Number: [id number] ?Patient Account Number: [id number] ?Date of Birth/Sex: 06-11-1966 (56 y.o. M) ?Treating RN: [name] ?Primary Care Lorey Pallett: [name] ?Other Clinician: Jacqulyn Bath ?Referring Treasure Ingrum: [name] ?Treating Heydy Montilla/Extender: Kalman Shan ?Weeks in Treatment: 16 ?HBO Safety Checklist Items ?Safety Checklist ?Consent Form Signed ?Patient voided / foley secured and emptied ?When did you last eato 10:00 am ?Last dose of injectable or oral agent n/a ?Ostomy pouch emptied and vented if applicable ?NA ?All implantable devices assessed, documented and approved ?NA ?Intravenous access site secured and place ?NA ?Valuables secured ?Linens and cotton and cotton/polyester blend (less than 51% polyester) ?Personal oil-based products / skin lotions / body lotions removed ?Wigs or hairpieces removed ?NA ?Smoking or tobacco materials removed ?NA ?Books / newspapers / magazines / loose paper removed ?NA ?Cologne, aftershave, perfume and deodorant removed ?Jewelry removed (may wrap wedding band) ?Make-up removed ?NA ?Hair care products removed ?Battery operated devices (external) removed ?NA ?Heating patches and chemical warmers removed ?NA ?Titanium eyewear removed ?NA ?Nail polish cured greater than 10 hours ?NA ?Casting material cured greater than 10 hours ?NA ?Hearing aids removed ?NA ?Loose dentures or partials removed ?NA ?Prosthetics have been  removed ?NA ?Patient demonstrates correct use of air break device (if applicable) ?Patient concerns have been addressed ?Patient grounding bracelet on and cord attached to chamber ?Specifics for Inpatients (complete in addition to ?above) ?Medication sheet sent with patient ?Intravenous medications needed or due  during therapy sent with patient ?Drainage tubes (e.g. nasogastric tube or chest tube secured and vented) ?Endotracheal or Tracheotomy tube secured ?Cuff deflated of air and inflated with saline ?Airway suctioned ?Electronic Signature(s) ?Signed: 02/26/2022 1:56:57 PM By: Enedina Finner RCP, RRT, CHT ?Entered By: Enedina Finner on 02/26/2022 11:48:59 ?

## 2022-02-27 ENCOUNTER — Encounter: Payer: Medicare Other | Admitting: Physician Assistant

## 2022-02-27 DIAGNOSIS — Z4889 Encounter for other specified surgical aftercare: Secondary | ICD-10-CM | POA: Diagnosis not present

## 2022-02-27 DIAGNOSIS — I739 Peripheral vascular disease, unspecified: Secondary | ICD-10-CM | POA: Diagnosis not present

## 2022-02-27 DIAGNOSIS — Z89412 Acquired absence of left great toe: Secondary | ICD-10-CM | POA: Diagnosis not present

## 2022-02-28 ENCOUNTER — Encounter: Payer: Medicare Other | Admitting: Physician Assistant

## 2022-02-28 DIAGNOSIS — I120 Hypertensive chronic kidney disease with stage 5 chronic kidney disease or end stage renal disease: Secondary | ICD-10-CM | POA: Diagnosis not present

## 2022-02-28 DIAGNOSIS — D631 Anemia in chronic kidney disease: Secondary | ICD-10-CM | POA: Diagnosis not present

## 2022-02-28 DIAGNOSIS — N186 End stage renal disease: Secondary | ICD-10-CM | POA: Diagnosis not present

## 2022-02-28 DIAGNOSIS — E1122 Type 2 diabetes mellitus with diabetic chronic kidney disease: Secondary | ICD-10-CM | POA: Diagnosis not present

## 2022-02-28 DIAGNOSIS — M86672 Other chronic osteomyelitis, left ankle and foot: Secondary | ICD-10-CM | POA: Diagnosis not present

## 2022-02-28 DIAGNOSIS — I96 Gangrene, not elsewhere classified: Secondary | ICD-10-CM | POA: Diagnosis not present

## 2022-02-28 DIAGNOSIS — I110 Hypertensive heart disease with heart failure: Secondary | ICD-10-CM | POA: Diagnosis not present

## 2022-02-28 DIAGNOSIS — E11621 Type 2 diabetes mellitus with foot ulcer: Secondary | ICD-10-CM | POA: Diagnosis not present

## 2022-02-28 DIAGNOSIS — Z4781 Encounter for orthopedic aftercare following surgical amputation: Secondary | ICD-10-CM | POA: Diagnosis not present

## 2022-02-28 DIAGNOSIS — E1151 Type 2 diabetes mellitus with diabetic peripheral angiopathy without gangrene: Secondary | ICD-10-CM | POA: Diagnosis not present

## 2022-02-28 DIAGNOSIS — L97524 Non-pressure chronic ulcer of other part of left foot with necrosis of bone: Secondary | ICD-10-CM | POA: Diagnosis not present

## 2022-02-28 DIAGNOSIS — I2510 Atherosclerotic heart disease of native coronary artery without angina pectoris: Secondary | ICD-10-CM | POA: Diagnosis not present

## 2022-02-28 DIAGNOSIS — L97522 Non-pressure chronic ulcer of other part of left foot with fat layer exposed: Secondary | ICD-10-CM | POA: Diagnosis not present

## 2022-02-28 DIAGNOSIS — I5042 Chronic combined systolic (congestive) and diastolic (congestive) heart failure: Secondary | ICD-10-CM | POA: Diagnosis not present

## 2022-02-28 DIAGNOSIS — L02612 Cutaneous abscess of left foot: Secondary | ICD-10-CM | POA: Diagnosis not present

## 2022-02-28 DIAGNOSIS — N2581 Secondary hyperparathyroidism of renal origin: Secondary | ICD-10-CM | POA: Diagnosis not present

## 2022-02-28 DIAGNOSIS — Z992 Dependence on renal dialysis: Secondary | ICD-10-CM | POA: Diagnosis not present

## 2022-02-28 LAB — GLUCOSE, CAPILLARY
Glucose-Capillary: 266 mg/dL — ABNORMAL HIGH (ref 70–99)
Glucose-Capillary: 166 mg/dL — ABNORMAL HIGH (ref 70–99)

## 2022-02-28 NOTE — Progress Notes (Addendum)
Prange, [place] ([id number]) Visit Report for 02/28/2022 Chief Complaint Document Details Patient Name: [name], [name]. Date of Service: 02/28/2022 1:15 PM Medical Record Number: [id number] Patient Account Number: [id number] Date of Birth/Sex: 1965/12/13 (56 y.o. M) Treating RN: [name] Primary Care Provider: [name] Other Clinician: Referring Provider: [name] Treating Provider/Extender: [name] in Treatment: 17 Information Obtained from: Patient Chief Complaint HBO evaluation for treatment on the left foot diabetic ulcer with osteomyelitis. Patient is currently under our care as well for his foot. Electronic Signature(s) Signed: 02/28/2022 12:49:52 PM By: Worthy Keeler PA-C Entered By: Worthy Keeler on 02/28/2022 12:49:51 [name], [name] ([id number]) -------------------------------------------------------------------------------- Cellular or Tissue Based Product Details Patient Name: [name], [name] Date of Service: 02/28/2022 1:15 PM Medical Record Number: [id number] Patient Account Number: [id number] Date of Birth/Sex: [birth year] (56 y.o. M) Treating RN: [name] Primary Care Provider: [name] Other Clinician: Referring Provider: [name] Treating Provider/Extender: [name] in Treatment: 17 Cellular or Tissue Based Product Type Wound #1 Left Amputation Site - Digit Applied to: Performed By: Physician Tommie Sams., PA-C Cellular or Tissue Based Product Epicord Type: Level of Consciousness (Pre- Awake and Alert procedure): Pre-procedure Verification/Time Out Yes - 13:00 Taken: Location: genitalia / hands / feet / multiple digits Wound Size (sq cm): 2.56 Product Size (sq cm): 6 Waste Size (sq cm): 0 Amount of Product Applied (sq cm): 6 Instrument Used: Forceps, Scissors Lot #: [id number] Order #: 1 Expiration Date: 11/20/2026 Fenestrated: Yes Instrument: Blade Reconstituted: Yes Solution Type: normal  saline Solution Amount: 10 ml Lot #: [id number] Solution Expiration Date: 10/21/2023 Secured: Yes Secured With: Steri-Strips Dressing Applied: Yes Primary Dressing: adaptic Procedural Pain: 0 Post Procedural Pain: 0 Response to Treatment: Procedure was tolerated well Level of Consciousness (Post- Awake and Alert procedure): Post Procedure Diagnosis Same as Pre-procedure Electronic Signature(s) Signed: 03/07/2022 1:44:49 PM By: [name] RN Entered By: [name] on 03/07/2022 13:44:49 [name], [name] ([id number]) -------------------------------------------------------------------------------- Debridement Details Patient Name: [name], [name] Date of Service: 02/28/2022 1:15 PM Medical Record Number: [id number] Patient Account Number: [id number] Date of Birth/Sex: April 29, 1966 (56 y.o. M) Treating RN: [name] Primary Care Provider: [name] Other Clinician: Referring Provider: [name] Treating Provider/Extender: [name] in Treatment: 17 Debridement Performed for Wound #2 Left,Lateral Foot Assessment: Performed By: Physician Tommie Sams., PA-C Debridement Type: Debridement Severity of Tissue Pre Debridement: Fat layer exposed Level of Consciousness (Pre- Awake and Alert procedure): Pre-procedure Verification/Time Out Yes - 13:05 Taken: Start Time: 13:05 Total Area Debrided (L x W): 6 (cm) x 3 (cm) = 18 (cm) Tissue and other material Callus, Subcutaneous debrided: Level: Skin/Subcutaneous Tissue Debridement Description: Excisional Instrument: Curette Bleeding: Moderate Hemostasis Achieved: Silver Nitrate End Time: 13:18 Procedural Pain: 0 Post Procedural Pain: 0 Response to Treatment: Procedure was tolerated well Level of Consciousness (Post- Awake and Alert procedure): Post Debridement Measurements of Total Wound Length: (cm) 4 Width: (cm) 2 Depth: (cm) 0.5 Volume: (cm) 3.142 Character of Wound/Ulcer Post Debridement:  Improved Severity of Tissue Post Debridement: Fat layer exposed Post Procedure Diagnosis Same as Pre-procedure Electronic Signature(s) Signed: 02/28/2022 6:04:02 PM By: Worthy Keeler PA-C Signed: 03/04/2022 10:35:39 AM By: [name] RN Entered By: [name] on 02/28/2022 13:16:57 [name], [name] ([id number]) -------------------------------------------------------------------------------- HPI Details Patient Name: [name], [name] Date of Service: 02/28/2022 1:15 PM Medical Record Number: [id number] Patient Account Number: [id number] Date of Birth/Sex: 1966-01-11 (56 y.o. M) Treating RN: [name] Primary  Care Provider: [name] Other Clinician: Referring Provider: [name] Treating Provider/Extender: [name] in Treatment: 17 History of Present Illness HPI Description: 10/31/2021 patient presents today for evaluation here in our clinic. He is actually being evaluated for hyperbaric oxygen therapy only. He has been seen at [facility] up to this point he is under the care of the vascular surgery specialty. Subsequently he is also under the care of the hyperbaric center there. With that being said that due to the fact that he is a dialysis patient he was actually recommended to be transferred to [place] for his treatments he actually lives in [place] which is where his dialysis is. It was a hardship for him to try to get from [place] especially on dialysis days all the way to [facility] get into the facility for his treatment and get back home he was literally spending all day long. He has had just 4 treatments there at [facility] thus far based on what I see in these have not been continued while he awaits approval here in our clinic. Subsequently I do have records for review that will be included in the HPI predetermination review and attached to this chart as well. I will not duplicate that here. 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 Nonetheless he continues to have exposed bone with marked soft tissue swelling compatible with osteomyelitis. This is due to the irregularity of the head of the metatarsal as well. Nonetheless along with having associated cellulitis he is also good to be seen by infectious disease and is currently on antibiotics for this as well. Again that will be detailed in the pretreatment review section which I will attach to this note as well. He does currently have a wound VAC in place and again the wound was not evaluated here in the clinic as that is being completely managed by home health and [facility]. The patient does have a history of diabetes mellitus type 2, hypertension, coronary artery disease, and congestive heart failure. He also has cataracts of both eyes but no evidence of glaucoma on his most recent eye exam. 11/22/2021 we have been seeing this gentleman for hyperbaric oxygen therapy but have not actually evaluated his wound up to this point this was being managed by the wound care and vascular team I presumed at [place] at least that is what has been told to be previous. Nonetheless at this point I got a call from Atlee Abide who is a Librarian, academic at the [facility] vascular clinic with Dr. Durene Fruits and subsequently they were wanting to know if I could take over wound care for this patient. Apparently they do not have the ability for an office debridement which again I completely understand but nonetheless definitely is not an integral part of his healing along with the hyperbarics. Subsequently I discussed with the patient today that I do believe he would benefit going ahead and proceeding with evaluation here in the clinic for that reason I did go ahead and see him today to get things started. There is also been some confusion about the issues with his home  health nurse and getting measurements to [facility] for the wound VAC in order to continue his wound VAC therapy. With that being said after I see him today we will make a determination on what to  do with wound VAC we can definitely send measurements to [facility] but again the bigger question is whether this is the best way to go or not. 11/28/2021 upon evaluation today patient appears to be doing decently well in regard to his wound. He has been tolerating the dressing changes with the Dakin's moistened gauze dressing which I think is doing a good job. Fortunately I do not see any signs of active infection locally nor systemically at this point. In general I think that he is actually making excellent progress which is great news. 2/17; seen in conjunction with HBO today. 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 Much improved condition of the wound surface. On the lateral foot and eschared area that he has been using Betadine. Tolerating HBO well 12/13/2021 upon evaluation today patient appears to be doing pretty well in regard to his foot ulcer. I am actually much more pleased currently that I been with the way the appearance looks today. The wound bed does show some signs of slough and biofilm on the surface of the wound this is minimal I Minna perform debridement the I will do so very carefully due to the fact that to be honest the patient had a significant amount of bleeding in the past being on the Brilinta he had a difficult time clotting when I debrided him previously quite significantly. Fortunately I do not see any signs of active infection at this time which is great news. No fevers, chills, nausea, vomiting, or diarrhea. 12/20/2021 upon evaluation today patient actually appears to be showing some signs of excellent improvement here. I am very pleased with where things stand currently. There does not appear to be any evidence of active infection locally nor systemically  which is great news and I do believe that with the hyperbaric oxygen therapy he is actually making excellent progress. 3/9; patient presents for follow-up. He continues to do HBO therapy without issues. He has completed 34 out of 40 sessions. He continues to use Dakin's wet-to-dry to the amputation site and Betadine to the lateral foot wound. he currently denies signs of infection. 01/03/2022 upon evaluation today patient appears to be doing better in regard to his distal foot ulcer. Unfortunately the lateral foot is a different story this area has softened up and is getting need to be debrided away today. I Georgina Peer do this with scissors and forceps and was able to clear that out that will be detailed below. Otherwise I feel like the Dakin's moistened gauze dressing is doing an awesome job for him and he is making significantly improvements here. 01/10/2022 upon evaluation today patient continues to have some of the necrotic tissue loosen up on the lateral portion of his foot the main wound that we have been treating is actually doing quite well. Overall very pleased with where things stand in general. No fevers, chills, nausea, vomiting, or diarrhea. 01/17/2022 upon evaluation today patient appears to be doing somewhat poorly in regard to the lateral foot wound. The main wound that we initially were seeing him for looks to be doing great. This lateral foot has worsened and unfortunately it appears to potentially have some signs of infection here. I do believe that we may need to see about getting started on some IV antibiotics he is in dialysis so they could potentially do this for him, Monday. Nonetheless I want to try to get that started ASAP. Again I would prefer him be initiated with IV  vancomycin and cefepime just as an empiric broad-spectrum until we can get the culture results back at least. Subsequently also think that this should be done sooner rather than later due to the fact that his leg is  more swollen and overall this seems to have progressed fairly rapidly since last week I do not want this to get worse. He voiced understanding. 01-24-2022 upon evaluation today patient appears to be doing well with regard to his main ulcer that we have been seeing him for. I am very pleased with where things stand currently. He still continues to have bleeding if we do any type of aggressive sharp debridement there for him Fredricksen, Hadriel L. ([id number]) having to be very careful using Dakin's solution along with debridement as I can here in the office. Overall the patient seems to be doing well. Unfortunately the lateral foot wound still has progressed a little bit further. We did get him on antibiotics starting this past Monday I think the drainage looks much better and overall I am seeing improvement but again this was already broken down to a certain degree I am hopeful by next week this will really start turnaround for [place]. We will also get a send him for an x-ray to evaluate for any signs of osteomyelitis on this lateral foot. 01-31-2022 upon evaluation today patient's wound on the medial left amputation site is actually doing excellent. I am very pleased with where things stand this is significantly smaller and we are headed in the right direction. In regard to the left lateral foot there is still some necrotic tissue to be cleared away although I see evidence of this starting to basically solidify as far as the edges are concerned with how far it spread I do not see anything seeming to want to spread further. I believe this may be close to done as far as any evolving is concerned. 02-07-2022 upon evaluation today patient appears to be doing well with regard to the main amputation site. Unfortunately the secondary site on the left lateral foot is still giving him some trouble here. There is a lot of dried tissue and I do believe that along with the Dakin's we may want to use a contact layer in the base  of the wound to try to prevent the bone from drying out because we are down to that point at this time. The patient voiced understanding at this point as well and is happy to do what ever it takes to try to get this better. With that being said I do believe that it is possible he may benefit from a wound VAC. Again the question is the approval process for getting this done obviously we can see what we can do if we get things cleaned a little bit better but right now I think the best option is going to be for [place] to continue with the packing currently. 02-14-2022 upon evaluation today patient presents for follow-up concerning ongoing issues with his foot. He did have the MRI which does show that he has evidence of osteomyelitis of the digit at the fifth location of his foot. Subsequently this does extend down to the base of the digit and into the proximal portion of the metatarsal as well. With that being said the bone does not appear to be too severely broken down and a lot of the marrow signal is stated to be maintained which is good news. Nonetheless I do believe that he is unfortunately still in a spot  where this is precarious and we need to make sure that we are treating this appropriately. He does seem to be doing well with IV antibiotics he also seems to be doing quite well when it comes to the overall appearance of the wound bed at this point. I am happy in that regard. 02-21-2022 upon evaluation today patient appears to be doing well all things considered in regard to his wounds. I do feel like that he is making some progress here. The wound on the right distal foot where the amputation site is is actually healing nicely I feel like we can switch over to collagen at this point that should do well for him. In regard to the left lateral foot this is actually getting much cleaner he still has some area where there seems to be some pressure getting to the location I think that he would do well with a  knee scooter at this point. I discussed that with him he does not seem prone to going that route at this point he understands it can definitely help. 02-28-2022 upon evaluation today patient appears to be doing well currently with regard to the wound on his foot. The main surgical wound is actually significantly improved and the lateral wound is doing better there is still bone exposed but nonetheless this does not appear to be significantly worse than what we have noted previous. In fact I think it looks better than it did last week. My goal is to keep the bone from drying out and subsequently for that reason we are going to go ahead and proceed with the EpiFix which I think is good to be a good option here for him. Over top of the EpiFix we will do a contact layer in order to hopefully keep things from drying out. This dressing will actually stay in place until he comes back. Patient did see vascular yesterday and states that they did not feel like anything was doing worse still it appears that if it were left up to them they think the ideal thing would be to proceed with an amputation. I explained to the patient that to be honest is not always avoidable but if we can try to avoid amputation that is what we are after. He voiced understanding and he does want to do what ever he can to try to prevent amputation for that reason we are proceeding with the epi cord today. This will be application #1. Electronic Signature(s) Signed: 02/28/2022 5:38:43 PM By: Worthy Keeler PA-C Previous Signature: 02/28/2022 5:37:54 PM Version By: Worthy Keeler PA-C Entered By: Worthy Keeler on 02/28/2022 17:38:43 [name], [name] ([id number]) -------------------------------------------------------------------------------- Physical Exam Details Patient Name: [name], [name] Date of Service: 02/28/2022 1:15 PM Medical Record Number: [id number] Patient Account Number: [id number] Date of Birth/Sex: August 09, 1966 (57 y.o.  M) Treating RN: [name] Primary Care Provider: [name] Other Clinician: Referring Provider: [name] Treating Provider/Extender: [name] in Treatment: 54 Constitutional Well-nourished and well-hydrated in no acute distress. Respiratory normal breathing without difficulty. Psychiatric this patient is able to make decisions and demonstrates good insight into disease process. Alert and Oriented x 3. pleasant and cooperative. Notes Upon inspection patient's wound bed actually showed signs of good granulation and some areas although there was still a lot of necrotic tissue and bone exposed. Nonetheless I do believe we are on the right track here and the wound is significantly clean compared to previous. My goal is to try to get  this to start healing and from inside to outside there is definitely viable bone noted here and we are trying to remove any of the nonviable as it becomes obvious and prevalent. I did perform debridement today before application of the epi cord. I then secured this with Mepitel 1 and Steri-Strips with a small bolster behind. Electronic Signature(s) Signed: 02/28/2022 5:39:29 PM By: Worthy Keeler PA-C Entered By: Worthy Keeler on 02/28/2022 17:39:29 [name], [name] ([id number]) -------------------------------------------------------------------------------- Physician Orders Details Patient Name: [name], [name] Date of Service: 02/28/2022 1:15 PM Medical Record Number: [id number] Patient Account Number: [id number] Date of Birth/Sex: 12/15/1965 (56 y.o. 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 - provider visits at wound center o Return Appointment in: - HBO- (daily Monday -Friday for HBO Treatments) [place] for wound care. May utilize formulary equivalent dressing for wound treatment orders unless otherwise specified. Home Health Nurse may visit PRN to address patientos wound care needs. o Scheduled days for dressing changes to be completed; exception, patient has scheduled wound care visit that day. o **Please direct any NON-WOUND related issues/requests for orders to patient's Primary Care Physician. **If current dressing causes regression in wound condition, may D/C ordered dressing product/s and apply Normal Saline Moist Dressing daily until next [place] or Other MD appointment. **Notify Wound Healing Center of regression in wound condition at [phone]. Bathing/ Shower/ Hygiene o Clean wound with Normal Saline or wound cleanser. o May shower with wound dressing protected with water repellent cover or cast protector. o No tub bath. Cellular or Tissue Based Products Wound #2 Left,Lateral Foot o Cellular or Tissue Based Product Type: - epifix #1 o Cellular or Tissue Based Product applied to wound bed; including contact layer, fixation with steri-strips, dry gauze and cover dressing. (DO NOT REMOVE). Edema Control - Lymphedema / Segmental Compressive Device / Other o Elevate, Exercise Daily and Avoid Standing for Long Periods of Time. o Elevate legs to the level of the heart and pump ankles as often as  possible o Elevate leg(s) parallel to the floor when sitting. Off-Loading Left Lower Extremity o Foot Defender o - size large Additional Orders / Instructions o Follow Nutritious Diet and Increase Protein Intake o Other: - Follow vascular, nephrology and PCP for wound care and other needs Dialysis appt as scheduled M-W-F Hyperbaric Oxygen Therapy o Indication and location: - Left 1st and 2nd Ray Amputation osteomyelitis and left lateral foot osteomyelitis. I added treatments onto the original regimen to account for the lateral foot osteomyelitis. o If appropriate for treatment, begin HBOT per protocol: o 2.0 ATA for 90 Minutes without Air Breaks o One treatment per day (delivered Monday through Friday unless otherwise specified in Special Instructions below): [place], [place] ([id number]) o Total # of Treatments: - 100 o Finger stick Blood Glucose Pre- and Post- HBOT Treatment. o Follow Hyperbaric  Oxygen Glycemia Protocol Wound Treatment Wound #1 - Amputation Site - Digit Wound Laterality: Left Cleanser: Wound Cleanser 3 x Per Week/30 Days Discharge Instructions: Wash your hands with soap and water. Remove old dressing, discard into plastic bag and place into trash. Cleanse the wound with Wound Cleanser prior to applying a clean dressing using gauze sponges, not tissues or cotton balls. Do not scrub or use excessive force. Pat dry using gauze sponges, not tissue or cotton balls. Primary Dressing: Prisma 4.34 (in) 3 x Per Week/30 Days Discharge Instructions: Moisten w/normal saline or sterile water; Cover wound as directed. Do not remove from wound bed. Secured With: Medipore Tape - 31M Medipore H Soft Cloth Surgical Tape, 2x2 (in/yd) 3 x Per Week/30 Days Secured With: [employer] Sterile or Non-Sterile 6-ply 4.5x4 (yd/yd) 3 x Per Week/30 Days Discharge Instructions: Apply Kerlix as directed Secured With: ABD pad 3 x Per Week/30 Days Wound #2 - Foot Wound Laterality:  Left, Lateral Cleanser: Wound Cleanser 3 x Per Week/30 Days Discharge Instructions: Wash your hands with soap and water. Remove old dressing, discard into plastic bag and place into trash. Cleanse the wound with Wound Cleanser prior to applying a clean dressing using gauze sponges, not tissues or cotton balls. Do not scrub or use excessive force. Pat dry using gauze sponges, not tissue or cotton balls. Secondary Dressing: ABD Pad 5x9 (in/in) 3 x Per Week/30 Days Discharge Instructions: Cover with ABD pad Secondary Dressing: Kerlix 4.5 x 4.1 (in/yd) 3 x Per Week/30 Days Discharge Instructions: Apply Kerlix 4.5 x 4.1 (in/yd) as instructed Secured With: Medipore Tape - 31M Medipore H Soft Cloth Surgical Tape, 2x2 (in/yd) 3 x Per Week/30 Days Secured With: ABD pad 3 x Per Week/30 Days GLYCEMIA INTERVENTIONS PROTOCOL PRE-HBO GLYCEMIA INTERVENTIONS ACTION INTERVENTION Obtain pre-HBO capillary blood glucose (ensure 1 physician order is in chart). A. Notify HBO physician and await physician orders. 2 If result is 70 mg/dl or below: B. If the result meets the hospital definition of a critical result, follow hospital policy. A. Give patient an 8 ounce Glucerna Shake, an 8 ounce Ensure, or 8 ounces of a Glucerna/Ensure equivalent dietary supplement*. B. Wait 30 minutes. If result is 71 mg/dl to 130 mg/dl: C. Retest patientos capillary blood glucose (CBG). D. If result greater than or equal to 110 mg/dl, proceed with HBO. If result less than 110 mg/dl, notify HBO physician and consider holding HBO. If result is 131 mg/dl to 249 mg/dl: A. Proceed with HBO. A. Notify HBO physician and await physician orders. B. It is recommended to hold HBO and do If result is 250 mg/dl or greater: blood/urine ketone testing. C. If the result meets the hospital definition of a critical result, follow hospital policy. POST-HBO GLYCEMIA INTERVENTIONS ACTION INTERVENTION Obtain post HBO capillary blood  glucose (ensure 1 physician order is in chart). 2 If result is 70 mg/dl or below: A. Notify HBO physician and await physician orders. [place], [place] ([id number]) B. If the result meets the hospital definition of a critical result, follow hospital policy. A. Give patient an 8 ounce Glucerna Shake, an 8 ounce Ensure, or 8 ounces of a Glucerna/Ensure equivalent dietary supplement*. B. Wait 15 minutes for symptoms of hypoglycemia (i.e. nervousness, anxiety, If result is 71 mg/dl to 100 mg/dl: sweating, chills, clamminess, irritability, confusion, tachycardia or dizziness). C. If patient asymptomatic, discharge patient. If patient symptomatic, repeat capillary blood glucose (CBG) and notify HBO physician. If result is 101 mg/dl to 249 mg/dl: A. Discharge patient.  A. Notify HBO physician and await physician orders. B. It is recommended to do blood/urine If result is 250 mg/dl or greater: ketone testing. C. If the result meets the hospital definition of a critical result, follow hospital policy. *Juice or candies are NOT equivalent products. If patient refuses the Glucerna or Ensure, please consult the hospital dietitian for an appropriate substitute. Electronic Signature(s) Signed: 03/06/2022 5:51:16 PM By: Worthy Keeler PA-C Previous Signature: 02/28/2022 6:04:02 PM Version By: Worthy Keeler PA-C Previous Signature: 03/04/2022 10:35:39 AM Version By: [name] RN Entered By: Worthy Keeler on 03/06/2022 17:46:29 [place], [place] ([id number]) -------------------------------------------------------------------------------- Problem List Details Patient Name: Boeve, Jaise L. Date of Service: 02/28/2022 1:15 PM Medical Record Number: [id number] Patient Account Number: [id number] Date of Birth/Sex: [birth year] (56 y.o. 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/31/2021 No Yes bone M86.672 Other chronic osteomyelitis, left ankle and foot 10/31/2021 No Yes I10 Essential (primary) hypertension 10/31/2021 No Yes I25.10 Atherosclerotic heart disease of native coronary artery without angina 10/31/2021 No Yes pectoris I50.42 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 ([id number]) -------------------------------------------------------------------------------- Progress Note Details Patient Name: Shellhammer, Ja L. Date of Service: 02/28/2022 1:15 PM Medical Record Number: [id number] Patient Account Number: [id number] Date of Birth/Sex: 11/22/1965 (56 y.o. M) Treating RN: [name] Primary Care Provider: [name] Other Clinician: Referring Provider: [name] Treating Provider/Extender: [name] in Treatment: 17 Subjective Chief Complaint Information obtained from Patient HBO evaluation for treatment on the left foot diabetic ulcer with osteomyelitis. Patient is currently under our care as well for his foot. History of Present Illness (HPI) 10/31/2021 patient presents today for evaluation here in our clinic. He is actually being evaluated for hyperbaric oxygen therapy only. He has been seen at [facility] up to this point he is under the care of the vascular surgery specialty. Subsequently he is also under the care of the hyperbaric center there. With that being said that due to  the fact that he is a dialysis patient he was actually recommended to be transferred to [place] for his treatments he actually lives in [place] which is where his dialysis is. It was a hardship for him to try to get from [place] especially on dialysis days all the way to [place] get into the facility for his treatment and get back home he was literally spending all day long. He has had just 4 treatments there at [facility] thus far based on what I see in these have not been continued while he awaits approval here in our clinic. Subsequently I do have records for review that will be included in the HPI predetermination review and attached to this chart as well. I will not duplicate that here. 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 Nonetheless he continues to have exposed bone  with marked soft tissue swelling compatible with osteomyelitis. This is due to the irregularity of the head of the metatarsal as well. Nonetheless along with having associated cellulitis he is also good to be seen by infectious disease and is currently on antibiotics for this as well. Again that will be detailed in the pretreatment review section which I will attach to this note as well. He does currently have a wound VAC in place and again the wound was not evaluated here in the clinic as that is being completely managed by home health and [facility] The patient does have a history of diabetes mellitus type 2, hypertension, coronary artery disease, and congestive heart failure. He also has cataracts of both eyes but no evidence of glaucoma on his most recent eye exam. 11/22/2021 we have been seeing this gentleman for hyperbaric oxygen therapy but have not actually evaluated his wound up to this point this was being managed by the wound care and vascular team I presumed at  [place] at least that is what has been told to be previous. Nonetheless at this point I got a call from Atlee Abide who is a Librarian, academic at the [facility] vascular clinic with Dr. Durene Fruits and subsequently they were wanting to know if I could take over wound care for this patient. Apparently they do not have the ability for an office debridement which again I completely understand but nonetheless definitely is not an integral part of his healing along with the hyperbarics. Subsequently I discussed with the patient today that I do believe he would benefit going ahead and proceeding with evaluation here in the clinic for that reason I did go ahead and see him today to get things started. There is also been some confusion about the issues with his home health nurse and getting measurements to [facility] for the wound VAC in order to continue his wound VAC therapy. With that being said after I see him today we will make a determination on what to do with wound VAC we can definitely send measurements to [facility] but again the bigger question is whether this is the best way to go or not. 11/28/2021 upon evaluation today patient appears to be doing decently well in regard to his wound. He has been tolerating the dressing changes with the Dakin's moistened gauze dressing which I think is doing a good job. Fortunately I do not see any signs of active infection locally nor systemically at this point. In general I think that he is actually making excellent progress which is great news. 2/17; seen in conjunction with HBO today. 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 Much improved condition of the wound surface. On the lateral foot and eschared area that he has been using Betadine. Tolerating HBO well 12/13/2021 upon evaluation today patient appears to be doing pretty well in regard to his foot ulcer. I am actually much more pleased currently that I been with the way the appearance looks  today. The wound bed does show some signs of slough and biofilm on the surface of the wound this is minimal I Minna perform debridement the I will do so very carefully due to the fact that to be honest the patient had a significant amount of bleeding in the past being on the Brilinta he had a difficult time clotting when I debrided him previously quite significantly. Fortunately I do not see any signs of active infection at this time which  is great news. No fevers, chills, nausea, vomiting, or diarrhea. 12/20/2021 upon evaluation today patient actually appears to be showing some signs of excellent improvement here. I am very pleased with where things stand currently. There does not appear to be any evidence of active infection locally nor systemically which is great news and I do believe that with the hyperbaric oxygen therapy he is actually making excellent progress. 3/9; patient presents for follow-up. He continues to do HBO therapy without issues. He has completed 34 out of 40 sessions. He continues to use Dakin's wet-to-dry to the amputation site and Betadine to the lateral foot wound. he currently denies signs of infection. 01/03/2022 upon evaluation today patient appears to be doing better in regard to his distal foot ulcer. Unfortunately the lateral foot is a different story this area has softened up and is getting need to be debrided away today. I Georgina Peer do this with scissors and forceps and was able to clear that out that will be detailed below. Otherwise I feel like the Dakin's moistened gauze dressing is doing an awesome job for him and he is making significantly improvements here. 01/10/2022 upon evaluation today patient continues to have some of the necrotic tissue loosen up on the lateral portion of his foot the main wound that we have been treating is actually doing quite well. Overall very pleased with where things stand in general. No fevers, chills, nausea, vomiting, or  diarrhea. 01/17/2022 upon evaluation today patient appears to be doing somewhat poorly in regard to the lateral foot wound. The main wound that we initially were seeing him for looks to be doing great. This lateral foot has worsened and unfortunately it appears to potentially have some signs of infection here. I do believe that we may need to see about getting started on some IV antibiotics he is in dialysis so they could potentially do this for him, Monday. Nonetheless I want to try to get that started ASAP. Again I would prefer him be initiated with IV vancomycin and cefepime just as an empiric broad-spectrum until we can get the culture results back at least. Subsequently also think that this should be done sooner rather [place], [name] ([id number]) than later due to the fact that his leg is more swollen and overall this seems to have progressed fairly rapidly since last week I do not want this to get worse. He voiced understanding. 01-24-2022 upon evaluation today patient appears to be doing well with regard to his main ulcer that we have been seeing him for. I am very pleased with where things stand currently. He still continues to have bleeding if we do any type of aggressive sharp debridement there for him having to be very careful using Dakin's solution along with debridement as I can here in the office. Overall the patient seems to be doing well. Unfortunately the lateral foot wound still has progressed a little bit further. We did get him on antibiotics starting this past Monday I think the drainage looks much better and overall I am seeing improvement but again this was already broken down to a certain degree I am hopeful by next week this will really start turnaround for [place]. We will also get a send him for an x-ray to evaluate for any signs of osteomyelitis on this lateral foot. 01-31-2022 upon evaluation today patient's wound on the medial left amputation site is actually doing excellent. I  am very pleased with where things stand this is significantly smaller and we are  headed in the right direction. In regard to the left lateral foot there is still some necrotic tissue to be cleared away although I see evidence of this starting to basically solidify as far as the edges are concerned with how far it spread I do not see anything seeming to want to spread further. I believe this may be close to done as far as any evolving is concerned. 02-07-2022 upon evaluation today patient appears to be doing well with regard to the main amputation site. Unfortunately the secondary site on the left lateral foot is still giving him some trouble here. There is a lot of dried tissue and I do believe that along with the Dakin's we may want to use a contact layer in the base of the wound to try to prevent the bone from drying out because we are down to that point at this time. The patient voiced understanding at this point as well and is happy to do what ever it takes to try to get this better. With that being said I do believe that it is possible he may benefit from a wound VAC. Again the question is the approval process for getting this done obviously we can see what we can do if we get things cleaned a little bit better but right now I think the best option is going to be for [place] to continue with the packing currently. 02-14-2022 upon evaluation today patient presents for follow-up concerning ongoing issues with his foot. He did have the MRI which does show that he has evidence of osteomyelitis of the digit at the fifth location of his foot. Subsequently this does extend down to the base of the digit and into the proximal portion of the metatarsal as well. With that being said the bone does not appear to be too severely broken down and a lot of the marrow signal is stated to be maintained which is good news. Nonetheless I do believe that he is unfortunately still in a spot where this is precarious and we  need to make sure that we are treating this appropriately. He does seem to be doing well with IV antibiotics he also seems to be doing quite well when it comes to the overall appearance of the wound bed at this point. I am happy in that regard. 02-21-2022 upon evaluation today patient appears to be doing well all things considered in regard to his wounds. I do feel like that he is making some progress here. The wound on the right distal foot where the amputation site is is actually healing nicely I feel like we can switch over to collagen at this point that should do well for him. In regard to the left lateral foot this is actually getting much cleaner he still has some area where there seems to be some pressure getting to the location I think that he would do well with a knee scooter at this point. I discussed that with him he does not seem prone to going that route at this point he understands it can definitely help. 02-28-2022 upon evaluation today patient appears to be doing well currently with regard to the wound on his foot. The main surgical wound is actually significantly improved and the lateral wound is doing better there is still bone exposed but nonetheless this does not appear to be significantly worse than what we have noted previous. In fact I think it looks better than it did last week. My goal is to keep  the bone from drying out and subsequently for that reason we are going to go ahead and proceed with the EpiFix which I think is good to be a good option here for him. Over top of the EpiFix we will do a contact layer in order to hopefully keep things from drying out. This dressing will actually stay in place until he comes back. Patient did see vascular yesterday and states that they did not feel like anything was doing worse still it appears that if it were left up to them they think the ideal thing would be to proceed with an amputation. I explained to the patient that to be honest is  not always avoidable but if we can try to avoid amputation that is what we are after. He voiced understanding and he does want to do what ever he can to try to prevent amputation for that reason we are proceeding with the epi cord today. This will be application #1. Objective Constitutional Well-nourished and well-hydrated in no acute distress. Vitals Time Taken: 12:48 PM, Height: 70 in, Weight: 180.4 lbs, BMI: 25.9, Temperature: 98.3 F, Pulse: 72 bpm, Respiratory Rate: 18 breaths/min, Blood Pressure: 118/78 mmHg. Respiratory normal breathing without difficulty. Psychiatric this patient is able to make decisions and demonstrates good insight into disease process. Alert and Oriented x 3. pleasant and cooperative. General Notes: Upon inspection patient's wound bed actually showed signs of good granulation and some areas although there was still a lot of necrotic tissue and bone exposed. Nonetheless I do believe we are on the right track here and the wound is significantly clean compared to previous. My goal is to try to get this to start healing and from inside to outside there is definitely viable bone noted here and we are trying to remove any of the nonviable as it becomes obvious and prevalent. I did perform debridement today before application of the epi cord. I then secured this with Mepitel 1 and Steri-Strips with a small bolster behind. Integumentary (Hair, Skin) Wound #1 status is Open. Original cause of wound was Surgical Injury. The date acquired was: 09/09/2021. The wound has been in treatment 14 [name], [name] ([id number]) weeks. 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 The wound measures 3.2cm length x 0.8cm width x 0.7cm depth; 2.011cm^2 area and 1.407cm^3 volume. There is Fat Layer (Subcutaneous Tissue) exposed. There is no tunneling or undermining noted. There is a medium amount of serous drainage noted. There is medium (34-66%) red, pink granulation within the  wound bed. There is a medium (34-66%) amount of necrotic tissue within the wound bed including Adherent Slough. Wound #2 status is Open. Original cause of wound was Blister. The date acquired was: 10/28/2021. The wound has been in treatment 13 weeks. The wound is located on the Left,Lateral Foot. The wound measures 4cm length x 2cm width x 0.5cm depth; 6.283cm^2 area and 3.142cm^3 volume. There is joint, tendon, and Fat Layer (Subcutaneous Tissue) exposed. There is no tunneling or undermining noted. There is a medium amount of serosanguineous drainage noted. There is small (1-33%) red granulation within the wound bed. There is a large (67-100%) amount of necrotic tissue within the wound bed including Eschar and Adherent Slough. 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 There was a Excisional Skin/Subcutaneous Tissue Debridement with a total area of 18 sq cm performed by Tommie Sams., PA-C. With the following instrument(s): Curette Material removed includes Callus and Subcutaneous Tissue and. No specimens were taken. A time out was conducted at 13:05, prior to the start of the procedure. A Moderate amount of bleeding was controlled with Silver Nitrate. The procedure was tolerated well with a pain level of 0 throughout and a pain level of 0 following the procedure. Post Debridement Measurements: 4cm length x 2cm  width x 0.5cm depth; 3.142cm^3 volume. Character of Wound/Ulcer Post Debridement is improved. Severity of Tissue Post Debridement is: Fat layer exposed. Post procedure Diagnosis Wound #2: Same as Pre-Procedure Plan Follow-up Appointments: Return Appointment in 1 week. - provider visits at wound center Return Appointment in: - HBO- (daily Monday -Friday for HBO Treatments) Home Health: [place] for wound care. May utilize formulary equivalent dressing for wound treatment orders unless otherwise specified. Home Health Nurse may visit PRN to address patient s wound care needs. Scheduled days for dressing changes to be completed; exception, patient has scheduled wound care visit that day. **Please direct any NON-WOUND related issues/requests for orders to patient's Primary Care Physician. **If current dressing causes regression in wound condition, may D/C ordered dressing product/s and apply Normal Saline Moist Dressing daily until next Roland or Other MD appointment. **Notify Wound Healing Center of regression in wound condition at [phone]. Bathing/ Shower/ Hygiene: Clean wound with Normal Saline or wound cleanser. May shower with wound dressing protected with water repellent cover or cast protector. No tub bath. Cellular or Tissue Based Products: Wound #2 Left,Lateral Foot: Cellular or Tissue Based Product Type: - epifix #1 Cellular or Tissue Based Product applied to wound bed; including contact layer, fixation with steri-strips, dry gauze and cover dressing. (DO NOT REMOVE). Edema Control - Lymphedema / Segmental Compressive Device / Other: Elevate, Exercise Daily and Avoid Standing for Long Periods of Time. Elevate legs to the level of the heart and pump ankles as often as possible Elevate leg(s) parallel to the floor when sitting. [place], [place] ([id number]) Off-Loading: Foot Defender  - size large Additional Orders /  Instructions: Follow Nutritious Diet and Increase Protein Intake Other: - Follow vascular, nephrology and PCP for wound care and other needs Dialysis appt as scheduled M-W-F Hyperbaric Oxygen Therapy: Indication and location: - Left 1st and 2nd Ray Amputation osteomyelitis and left lateral foot osteomyelitis. I added treatments onto the original regimen to account for the lateral foot osteomyelitis. If appropriate for treatment, begin HBOT per protocol: 2.0 ATA for 90 Minutes without Air Breaks One treatment per day (delivered Monday through Friday unless otherwise specified in Special Instructions below): Total # of Treatments: - 100 Finger stick Blood Glucose Pre- and Post- HBOT Treatment. Follow Hyperbaric Oxygen Glycemia Protocol WOUND #1: - Amputation Site - Digit Wound Laterality: Left Cleanser: Wound Cleanser 3 x Per Week/30 Days Discharge Instructions: Wash your hands with soap and water. Remove old dressing, discard into plastic bag and place into trash. Cleanse the wound with Wound Cleanser prior to applying a clean dressing using gauze sponges, not tissues or cotton balls. Do not scrub or use excessive  force. Pat dry using gauze sponges, not tissue or cotton balls. Primary Dressing: Prisma 4.34 (in) 3 x Per Week/30 Days Discharge Instructions: Moisten w/normal saline or sterile water; Cover wound as directed. Do not remove from wound bed. Secured With: Medipore Tape - 18M Medipore H Soft Cloth Surgical Tape, 2x2 (in/yd) 3 x Per Week/30 Days Secured With: [place] Financial Sterile or Non-Sterile 6-ply 4.5x4 (yd/yd) 3 x Per Week/30 Days Discharge Instructions: Apply Kerlix as directed Secured With: ABD pad 3 x Per Week/30 Days WOUND #2: - Foot Wound Laterality: Left, Lateral Cleanser: Wound Cleanser 3 x Per Week/30 Days Discharge Instructions: Wash your hands with soap and water. Remove old dressing, discard into plastic bag and place into trash. Cleanse the wound with Wound Cleanser  prior to applying a clean dressing using gauze sponges, not tissues or cotton balls. Do not scrub or use excessive force. Pat dry using gauze sponges, not tissue or cotton balls. Secondary Dressing: ABD Pad 5x9 (in/in) 3 x Per Week/30 Days Discharge Instructions: Cover with ABD pad Secondary Dressing: Kerlix 4.5 x 4.1 (in/yd) 3 x Per Week/30 Days Discharge Instructions: Apply Kerlix 4.5 x 4.1 (in/yd) as instructed Secured With: Medipore Tape - 18M Medipore H Soft Cloth Surgical Tape, 2x2 (in/yd) 3 x Per Week/30 Days Secured With: ABD pad 3 x Per Week/30 Days 1. My recommendation currently is good to be that we continue with the wound care measures as before and the patient is in agreement with plan. 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 I do believe this is doing quite well. 2. With regard to the lateral foot doing the epi cord and overall I do believe that this should hopefully help to stimulate some granulation growth and tissue growth over top of this muscle and bone exposed region. We will see how things do over the next week. Obviously were not to notice dramatic improvement in just 1 week's time but I am hopeful we will see some improvement nonetheless. We will see patient back for reevaluation in 1 week here in the clinic. If anything worsens or changes patient will contact our office for additional recommendations. Electronic Signature(s) Signed: 03/06/2022 5:46:59 PM By: Worthy Keeler PA-C Previous Signature: 02/28/2022 5:42:16 PM Version By: Worthy Keeler PA-C Entered By: Worthy Keeler on 03/06/2022 17:46:59 Kreps, Salman L. ([id number]) -------------------------------------------------------------------------------- SuperBill Details Patient Name: Ferrante, Donnelle L. Date of Service: 02/28/2022 Medical Record Number: [id number] Patient Account Number: [id number] Date of Birth/Sex: 07/09/1966 (56 y.o. 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 R26.89 Other abnormalities of gait and mobility Facility Procedures CPT4 Code: 71696789 Description: F8101 Epicord 2cmx3cm billed per Sq cm (6) Modifier: Quantity: 6 CPT4 Code: 75102585 Description: 27782 - SKIN SUB GRAFT FACE/NK/HF/G Modifier: Quantity: 1 CPT4 Code: Description: ICD-10 Diagnosis Description L97.524 Non-pressure chronic ulcer of other part of left foot with necrosis of bo Modifier: ne Quantity: Physician Procedures CPT4 Code: 4235361 Description: 44315 - WC PHYS SKIN SUB GRAFT FACE/NK/HF/G Modifier: Quantity: 1  CPT4 Code: Description: ICD-10 Diagnosis Description 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 Modifier: e Quantity: Electronic Signature(s) Signed: 03/04/2022 1:00:07 PM By: Gretta Cool, BSN, RN, CWS, Kim RN, BSN Signed: 03/04/2022 2:17:35 PM By: Worthy Keeler PA-C Previous Signature: 03/04/2022 12:57:26 PM Version By: Gretta Cool BSN, RN, CWS, Kim RN, BSN Previous Signature: 03/03/2022 8:27:43 AM Version By: [name] RN Previous Signature: 03/03/2022 5:23:27 PM Version By: Worthy Keeler PA-C Entered By: Gretta Cool, BSN, RN, CWS, Kim on 03/04/2022 13:00:07

## 2022-02-28 NOTE — Progress Notes (Addendum)
[place], [place] ([id number]) ?Visit Report for 02/28/2022 ?HBO Details ?Patient Name: [name], [name] ?Date of Service: 02/28/2022 11:00 AM ?Medical Record Number: [id number] ?Patient Account Number: [id number] ?Date of Birth/Sex: [birth year] (56 y.o. 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 ?HBO Treatment Details ?Treatment Number: 43 ?Patient Type: Outpatient ?Chamber Type: Monoplace ?Chamber Serial #: [id number] ?Treatment Protocol: 2.0 ATA with 90 minutes oxygen, and no air breaks ?Treatment Details ?Compression Rate Down: 1.5 psi / minute De-Compression Rate Up: ?Compress Tx Pressure Air breaks and breathing periods Decompress Decompress ?Begins Reached (leave unused spaces blank) Begins Ends ?Chamber Pressure (ATA) 1 2 - - - - - - 2 1 ?Clock Time (24 hr) 10:14 10:25 - - - - - - 11:55 12:03 ?Treatment Length: 109 (minutes) ?Treatment Segments: 4 ?Vital Signs ?Capillary Blood Glucose Reference Range: 80 - 120 mg / dl ?HBO Diabetic Blood Glucose Intervention Range: <131 mg/dl or >249 mg/dl ?Time Vitals Blood Respiratory Capillary Blood Glucose Pulse Action ?Type: ?Pulse: Temperature: ?Taken: ?Pressure: ?Rate: ?Glucose (mg/dl): ?Meter #: Oximetry (%) Taken: ?Pre 10:06 120/82 72 18 98.5 266 none per protocol ?Post 12:07 118/78 72 18 98.3 166 1 none per protocol ?Treatment Response ?Treatment Toleration: Well ?Treatment Completion ?Treatment Completed without Adverse Event ?Status: ?Electronic Signature(s) ?Signed: 02/28/2022 12:50:13 PM By: Donavan Burnet CHT, EMT, BS ?Signed: 02/28/2022 6:04:02 PM By: Worthy Keeler PA-C ?Previous Signature:  02/28/2022 10:48:37 AM Version By: Donavan Burnet CHT, EMT, BS ?Entered By: Donavan Burnet on 02/28/2022 12:50:12 ?[place], [place] ([id number]) ?-------------------------------------------------------------------------------- ?HBO Safety Checklist Details ?Patient Name: [name], [name] ?Date of Service: 02/28/2022 11:00 AM ?Medical Record Number: [id number] ?Patient Account Number: [id number] ?Date of Birth/Sex: 05/04/1966 (56 y.o. M) ?Treating RN: [name] ?Primary Care Vernita Tague: [name] ?Other Clinician: Donavan Burnet ?Referring Marquise Wicke: [name] ?Treating Devika Dragovich/Extender: Jeri Cos ?Weeks in Treatment: 17 ?HBO Safety Checklist Items ?Safety Checklist ?Consent Form Signed ?Patient voided / foley secured and emptied ?When did you last eato 0950 ?Last dose of injectable or oral agent n/a ?Ostomy pouch emptied and vented if applicable ?NA ?All implantable devices assessed, documented and approved ?NA ?Intravenous access site secured and place ?NA ?Valuables secured ?Linens and cotton and cotton/polyester blend (less than 51% polyester) ?Personal oil-based products / skin lotions / body lotions removed ?Wigs or hairpieces removed ?NA ?Smoking or tobacco materials removed ?NA ?Books / newspapers / magazines / loose paper removed ?Cologne, aftershave, perfume and deodorant removed ?Jewelry removed (may wrap wedding band) ?Make-up removed ?NA ?Hair care products removed ?Battery operated devices (external) removed ?Heating patches and chemical warmers removed ?Titanium eyewear removed ?NA ?Nail polish cured greater than 10 hours ?NA ?Casting material cured greater than 10 hours ?NA ?Hearing aids removed ?NA ?Loose dentures or partials removed ?NA ?Prosthetics have been removed ?NA ?Patient demonstrates correct use of air break device (if applicable) ?Patient concerns have been addressed ?Patient grounding bracelet on and cord attached to chamber ?Specifics for Inpatients (complete in addition  to ?above) ?NA Medication sheet sent with patient ?Intravenous medications needed or due during therapy sent with patient ?NA ?Drainage tubes (e.g. nasogastric tube or chest tube secured and vented) ?NA ?Endotracheal or Tracheotomy tube secured ?NA ?Cuff deflated of air and inflated with saline ?NA ?  Airway suctioned ?NA ?Notes ?Paper version used prior to treatment. ?Electronic Signature(s) ?Signed: 02/28/2022 10:48:12 AM By: Donavan Burnet CHT, EMT, BS ?Entered By: Donavan Burnet on 02/28/2022 10:48:12 ?

## 2022-02-28 NOTE — Progress Notes (Addendum)
[place], [place] ([id number]) ?Visit Report for 02/28/2022 ?Arrival Information Details ?Patient Name: [name], [name] ?Date of Service: 02/28/2022 11:00 AM ?Medical Record Number: [id number] ?Patient Account Number: [id number] ?Date of Birth/Sex: 12/17/1965 (56 y.o. M) ?Treating RN: [name] ?Primary Care Markayla Reichart: [name] Other Clinician: ?Referring Wess Baney: [name] ?Treating Ovie Eastep/Extender: Jeri Cos ?Weeks in Treatment: 17 ?Visit Information History Since Last Visit ?All ordered tests and consults were completed: Yes ?Patient Arrived: Ambulatory ?Added or deleted any medications: No ?Arrival Time: 09:57 ?Any new allergies or adverse reactions: No ?Accompanied By: self ?Had a fall or experienced change in No ?Transfer Assistance: None ?activities of daily living that may affect ?Patient Identification Verified: Yes ?risk of falls: ?Secondary Verification Process Completed: Yes ?Signs or symptoms of abuse/neglect since last visito No ?Patient Requires Transmission-Based No ?Hospitalized since last visit: No ?Precautions: ?Implantable device outside of the clinic excluding No ?Patient Has Alerts: Yes ?cellular tissue based products placed in the center ?Patient Alerts: Patient on Blood ?since last visit: ?Thinner ?Pain Present Now: No ?Electronic Signature(s) ?Signed: 02/28/2022 10:46:42 AM By: Donavan Burnet CHT, EMT, BS ?Entered By: Donavan Burnet on 02/28/2022 10:46:42 ?[place], [place] ([id number]) ?-------------------------------------------------------------------------------- ?Encounter Discharge Information Details ?Patient Name: [name], [name] ?Date of Service: 02/28/2022 11:00 AM ?Medical Record Number: [id number] ?Patient Account Number: [id number] ?Date of Birth/Sex: 1966/09/07 (56 y.o. M) ?Treating RN: [name] ?Primary Care Cherylee Rawlinson: [name] ?Other Clinician: Donavan Burnet ?Referring Astin Rape: [name] ?Treating Saajan Willmon/Extender: Jeri Cos ?Weeks in Treatment:  17 ?Encounter Discharge Information Items ?Discharge Condition: Stable ?Ambulatory Status: Ambulatory ?Discharge Destination: Other (Note Required) ?Transportation: Other ?Accompanied By: staff ?Schedule Follow-up Appointment: No ?Clinical Summary of Care: ?Notes ?Patient has wound care encounter after HBO Treatment. ?Electronic Signature(s) ?Signed: 02/28/2022 12:51:37 PM By: Donavan Burnet CHT, EMT, BS ?Entered By: Donavan Burnet on 02/28/2022 12:51:37 ?[place], [place] ([id number]) ?-------------------------------------------------------------------------------- ?Vitals Details ?Patient Name: [name], [name] ?Date of Service: 02/28/2022 11:00 AM ?Medical Record Number: [id number] ?Patient Account Number: [id number] ?Date of Birth/Sex: [birth year] (55 y.o. M) ?Treating RN: [name] ?Primary Care Jaleesa Cervi: [name] ?Other Clinician: Donavan Burnet ?Referring Alaze Garverick: [name] ?Treating Dinita Migliaccio/Extender: Jeri Cos ?Weeks in Treatment: 17 ?Vital Signs ?Time Taken: 10:06 ?Temperature (??F): 98.5 ?Height (in): 70 ?Pulse (bpm): 72 ?Weight (lbs): 180.4 ?Respiratory Rate (breaths/min): 18 ?Body Mass Index (BMI): 25.9 ?Blood Pressure (mmHg): 120/82 ?Capillary Blood Glucose (mg/dl): 266 ?Reference Range: 80 - 120 mg / dl ?Electronic Signature(s) ?Signed: 02/28/2022 10:47:07 AM By: Donavan Burnet CHT, EMT, BS ?Entered By: Donavan Burnet on 02/28/2022 10:47:07 ?

## 2022-03-03 ENCOUNTER — Encounter: Payer: Medicare Other | Admitting: Physician Assistant

## 2022-03-03 DIAGNOSIS — E11621 Type 2 diabetes mellitus with foot ulcer: Secondary | ICD-10-CM | POA: Diagnosis not present

## 2022-03-03 DIAGNOSIS — N186 End stage renal disease: Secondary | ICD-10-CM | POA: Diagnosis not present

## 2022-03-03 DIAGNOSIS — D631 Anemia in chronic kidney disease: Secondary | ICD-10-CM | POA: Diagnosis not present

## 2022-03-03 DIAGNOSIS — I110 Hypertensive heart disease with heart failure: Secondary | ICD-10-CM | POA: Diagnosis not present

## 2022-03-03 DIAGNOSIS — M86672 Other chronic osteomyelitis, left ankle and foot: Secondary | ICD-10-CM | POA: Diagnosis not present

## 2022-03-03 DIAGNOSIS — I2510 Atherosclerotic heart disease of native coronary artery without angina pectoris: Secondary | ICD-10-CM | POA: Diagnosis not present

## 2022-03-03 DIAGNOSIS — Z5181 Encounter for therapeutic drug level monitoring: Secondary | ICD-10-CM | POA: Diagnosis not present

## 2022-03-03 DIAGNOSIS — L02612 Cutaneous abscess of left foot: Secondary | ICD-10-CM | POA: Diagnosis not present

## 2022-03-03 DIAGNOSIS — L97524 Non-pressure chronic ulcer of other part of left foot with necrosis of bone: Secondary | ICD-10-CM | POA: Diagnosis not present

## 2022-03-03 DIAGNOSIS — I96 Gangrene, not elsewhere classified: Secondary | ICD-10-CM | POA: Diagnosis not present

## 2022-03-03 DIAGNOSIS — I5042 Chronic combined systolic (congestive) and diastolic (congestive) heart failure: Secondary | ICD-10-CM | POA: Diagnosis not present

## 2022-03-03 DIAGNOSIS — N2581 Secondary hyperparathyroidism of renal origin: Secondary | ICD-10-CM | POA: Diagnosis not present

## 2022-03-03 DIAGNOSIS — Z992 Dependence on renal dialysis: Secondary | ICD-10-CM | POA: Diagnosis not present

## 2022-03-03 LAB — GLUCOSE, CAPILLARY
Glucose-Capillary: 177 mg/dL — ABNORMAL HIGH (ref 70–99)
Glucose-Capillary: 158 mg/dL — ABNORMAL HIGH (ref 70–99)

## 2022-03-03 NOTE — Progress Notes (Signed)
[name] ([id number]) ?Visit Report for 03/03/2022 ?Arrival Information Details ?Patient Name: [name], [name] ?Date of Service: 03/03/2022 11:00 AM ?Medical Record Number: [id number] ?Patient Account Number: [id number] ?Date of Birth/Sex: [birth year] (56 y.o. M) ?Treating RN: [name] ?Primary Care [name]: [name] ?Other Clinician: Jacqulyn [name] ?Referring [name]: [name] ?Treating [name]: [name] ?Weeks in Treatment: 17 ?Visit Information History Since Last Visit ?Added or deleted any medications: No ?Patient Arrived: Ambulatory ?Any new allergies or adverse reactions: No ?Arrival Time: 09:45 ?Had a fall or experienced change in No ?Accompanied By: self ?activities of daily living that may affect ?Transfer Assistance: None ?risk of falls: ?Patient Identification Verified: Yes ?Signs or symptoms of abuse/neglect since last visito No ?Secondary Verification Process Completed: Yes ?Hospitalized since last visit: No ?Patient Requires Transmission-Based No ?Implantable device outside of the clinic excluding No ?Precautions: ?cellular tissue based products placed in the center ?Patient Has Alerts: Yes ?since last visit: ?Patient Alerts: Patient on Blood ?Pain Present Now: No ?Thinner ?Electronic Signature(s) ?Signed: 03/03/2022 2:16:35 PM By: [name] RCP, RRT, CHT ?Entered By: [name] on 03/03/2022 10:29:53 ?[name], [name] ([id number]) ?-------------------------------------------------------------------------------- ?Encounter Discharge Information Details ?Patient Name: [name], [name] ?Date of Service: 03/03/2022 11:00 AM ?Medical Record Number: [id number] ?Patient Account Number: [id number] ?Date of Birth/Sex: [birth year] (56 y.o. M) ?Treating RN: [name] ?Primary Care [name]: [name] ?Other Clinician: Jacqulyn [name] ?Referring Sayge Salvato: [name] ?Treating [name]/Extender: [name] ?Weeks in Treatment: 17 ?Encounter Discharge  Information Items ?Discharge Condition: Stable ?Ambulatory Status: Ambulatory ?Discharge Destination: Home ?Transportation: Private Auto ?Accompanied By: self ?Schedule Follow-up Appointment: Yes ?Clinical Summary of Care: ?Notes ?Patient has an HBO treatment scheduled on 03/04/22 at 11:00 am. ?Electronic Signature(s) ?Signed: 03/03/2022 2:16:35 PM By: [name] RCP, RRT, CHT ?Entered By: [name] on 03/03/2022 14:15:43 ?[name], [name] [place] ([id number]) ?-------------------------------------------------------------------------------- ?Vitals Details ?Patient Name: [name], [name] ?Date of Service: 03/03/2022 11:00 AM ?Medical Record Number: [id number] ?Patient Account Number: [id number] ?Date of Birth/Sex: 04-14-1966 (56 y.o. M) ?Treating RN: [name] ?Primary Care Breyson Kelm: [name] ?Other Clinician: Jacqulyn [name] ?Referring Akoni Parton: [name] ?Treating Shemekia Patane/Extender: [name] ?Weeks in Treatment: 17 ?Vital Signs ?Time Taken: 09:53 ?Temperature (??F): 98.4 ?Height (in): 70 ?Pulse (bpm): 78 ?Weight (lbs): 180.4 ?Respiratory Rate (breaths/min): 16 ?Body Mass Index (BMI): 25.9 ?Blood Pressure (mmHg): 128/72 ?Capillary Blood Glucose (mg/dl): 177 ?Reference Range: 80 - 120 mg / dl ?Electronic Signature(s) ?Signed: 03/03/2022 2:16:35 PM By: [name] RCP, RRT, CHT ?Entered By: [name] on 03/03/2022 11:38:06 ?

## 2022-03-04 ENCOUNTER — Encounter: Payer: Medicare Other | Admitting: Physician Assistant

## 2022-03-04 DIAGNOSIS — I110 Hypertensive heart disease with heart failure: Secondary | ICD-10-CM | POA: Diagnosis not present

## 2022-03-04 DIAGNOSIS — E11621 Type 2 diabetes mellitus with foot ulcer: Secondary | ICD-10-CM | POA: Diagnosis not present

## 2022-03-04 DIAGNOSIS — Z7982 Long term (current) use of aspirin: Secondary | ICD-10-CM | POA: Diagnosis not present

## 2022-03-04 DIAGNOSIS — D631 Anemia in chronic kidney disease: Secondary | ICD-10-CM | POA: Diagnosis not present

## 2022-03-04 DIAGNOSIS — Z9181 History of falling: Secondary | ICD-10-CM | POA: Diagnosis not present

## 2022-03-04 DIAGNOSIS — I5042 Chronic combined systolic (congestive) and diastolic (congestive) heart failure: Secondary | ICD-10-CM | POA: Diagnosis not present

## 2022-03-04 DIAGNOSIS — N186 End stage renal disease: Secondary | ICD-10-CM | POA: Diagnosis not present

## 2022-03-04 DIAGNOSIS — E039 Hypothyroidism, unspecified: Secondary | ICD-10-CM | POA: Diagnosis not present

## 2022-03-04 DIAGNOSIS — Z955 Presence of coronary angioplasty implant and graft: Secondary | ICD-10-CM | POA: Diagnosis not present

## 2022-03-04 DIAGNOSIS — Z992 Dependence on renal dialysis: Secondary | ICD-10-CM | POA: Diagnosis not present

## 2022-03-04 DIAGNOSIS — E1122 Type 2 diabetes mellitus with diabetic chronic kidney disease: Secondary | ICD-10-CM | POA: Diagnosis not present

## 2022-03-04 DIAGNOSIS — Z89431 Acquired absence of right foot: Secondary | ICD-10-CM | POA: Diagnosis not present

## 2022-03-04 DIAGNOSIS — L97524 Non-pressure chronic ulcer of other part of left foot with necrosis of bone: Secondary | ICD-10-CM | POA: Diagnosis not present

## 2022-03-04 DIAGNOSIS — Z89412 Acquired absence of left great toe: Secondary | ICD-10-CM | POA: Diagnosis not present

## 2022-03-04 DIAGNOSIS — E1151 Type 2 diabetes mellitus with diabetic peripheral angiopathy without gangrene: Secondary | ICD-10-CM | POA: Diagnosis not present

## 2022-03-04 DIAGNOSIS — M86672 Other chronic osteomyelitis, left ankle and foot: Secondary | ICD-10-CM | POA: Diagnosis not present

## 2022-03-04 DIAGNOSIS — E1142 Type 2 diabetes mellitus with diabetic polyneuropathy: Secondary | ICD-10-CM | POA: Diagnosis not present

## 2022-03-04 DIAGNOSIS — Z89422 Acquired absence of other left toe(s): Secondary | ICD-10-CM | POA: Diagnosis not present

## 2022-03-04 DIAGNOSIS — I120 Hypertensive chronic kidney disease with stage 5 chronic kidney disease or end stage renal disease: Secondary | ICD-10-CM | POA: Diagnosis not present

## 2022-03-04 DIAGNOSIS — Z4781 Encounter for orthopedic aftercare following surgical amputation: Secondary | ICD-10-CM | POA: Diagnosis not present

## 2022-03-04 DIAGNOSIS — Z7902 Long term (current) use of antithrombotics/antiplatelets: Secondary | ICD-10-CM | POA: Diagnosis not present

## 2022-03-04 DIAGNOSIS — I2510 Atherosclerotic heart disease of native coronary artery without angina pectoris: Secondary | ICD-10-CM | POA: Diagnosis not present

## 2022-03-04 DIAGNOSIS — E782 Mixed hyperlipidemia: Secondary | ICD-10-CM | POA: Diagnosis not present

## 2022-03-04 LAB — GLUCOSE, CAPILLARY
Glucose-Capillary: 158 mg/dL — ABNORMAL HIGH (ref 70–99)
Glucose-Capillary: 202 mg/dL — ABNORMAL HIGH (ref 70–99)

## 2022-03-04 NOTE — Progress Notes (Signed)
[name] ([id number]) ?Visit Report for 02/28/2022 ?Arrival Information Details ?Patient Name: [name], [name] ?Date of Service: 02/28/2022 1:15 PM ?Medical Record Number: [id number] ?Patient Account Number: [id number] ?Date of Birth/Sex: 1966-03-27 (56 y.o. M) ?Treating RN: [name] ?Primary Care Tanee Henery: [name] Other Clinician: ?Referring Angell Pincock: [name] ?Treating Guadalupe Kerekes/Extender: Jeri Cos ?Weeks in Treatment: 17 ?Visit Information History Since Last Visit ?All ordered tests and consults were completed: No ?Patient Arrived: Ambulatory ?Added or deleted any medications: No ?Arrival Time: 12:46 ?Any new allergies or adverse reactions: No ?Accompanied By: self ?Had a fall or experienced change in No ?Transfer Assistance: None ?activities of daily living that may affect ?Patient Identification Verified: Yes ?risk of falls: ?Secondary Verification Process Completed: Yes ?Signs or symptoms of abuse/neglect since last visito No ?Patient Requires Transmission-Based No ?Hospitalized since last visit: No ?Precautions: ?Implantable device outside of the clinic excluding No ?Patient Has Alerts: Yes ?cellular tissue based products placed in the center ?Patient Alerts: Patient on Blood ?since last visit: ?Thinner ?Has Dressing in Place as Prescribed: Yes ?Pain Present Now: No ?Electronic Signature(s) ?Signed: 03/04/2022 10:35:39 AM By: [name] RN ?Entered By: [name] on 02/28/2022 12:48:14 ?[place], [place] ([id number]) ?-------------------------------------------------------------------------------- ?Clinic Level of Care Assessment Details ?Patient Name: [name], [name] ?Date of Service: 02/28/2022 1:15 PM ?Medical Record Number: [id number] ?Patient Account Number: [id number] ?Date of Birth/Sex: 12-01-1965 (56 y.o. M) ?Treating RN: [name] ?Primary Care Rana Hochstein: [name] Other Clinician: ?Referring Neve Branscomb: [name] ?Treating Adelheid Hoggard/Extender: Jeri Cos ?Weeks in Treatment:  17 ?Clinic Level of Care Assessment Items ?TOOL 1 Quantity Score ?[]  - Use when EandM and Procedure is performed on INITIAL visit 0 ?ASSESSMENTS - Nursing Assessment / Reassessment ?[]  - General Physical Exam (combine w/ comprehensive assessment (listed just below) when performed on new ?0 ?pt. evals) ?[]  - 0 ?Comprehensive Assessment (HX, ROS, Risk Assessments, Wounds Hx, etc.) ?ASSESSMENTS - Wound and Skin Assessment / Reassessment ?[]  - Dermatologic / Skin Assessment (not related to wound area) 0 ?ASSESSMENTS - Ostomy and/or Continence Assessment and Care ?[]  - Incontinence Assessment and Management 0 ?[]  - 0 ?Ostomy Care Assessment and Management (repouching, etc.) ?PROCESS - Coordination of Care ?[]  - Simple Patient / Family Education for ongoing care 0 ?[]  - 0 ?Complex (extensive) Patient / Family Education for ongoing care ?[]  - 0 ?Staff obtains Consents, Records, Test Results / Process Orders ?[]  - 0 ?Staff telephones [place], Nursing Homes / Clarify orders / etc ?[]  - 0 ?Routine Transfer to another Facility (non-emergent condition) ?[]  - 0 ?Routine Hospital Admission (non-emergent condition) ?[]  - 0 ?New Admissions / Biomedical engineer / Ordering NPWT, Apligraf, etc. ?[]  - 0 ?Emergency Hospital Admission (emergent condition) ?PROCESS - Special Needs ?[]  - Pediatric / Minor Patient Management 0 ?[]  - 0 ?Isolation Patient Management ?[]  - 0 ?Hearing / Language / Visual special needs ?[]  - 0 ?Assessment of Community assistance (transportation, D/C planning, etc.) ?[]  - 0 ?Additional assistance / Altered mentation ?[]  - 0 ?Support Surface(s) Assessment (bed, cushion, seat, etc.) ?INTERVENTIONS - Miscellaneous ?[]  - External ear exam 0 ?[]  - 0 ?Patient Transfer (multiple staff / Civil Service fast streamer / Similar devices) ?[]  - 0 ?Simple Staple / Suture removal (25 or less) ?[]  - 0 ?Complex Staple / Suture removal (26 or more) ?[]  - 0 ?Hypo/Hyperglycemic Management (do not check if billed separately) ?[]  - 0 ?Ankle /  Brachial Index (ABI) - do not check if billed separately ?Has the patient been seen at the hospital within the last three  years: Yes ?Total Score: 0 ?Level Of Care: ____ ?[place], [place] ([id number]) ?Electronic Signature(s) ?Signed: 03/04/2022 10:35:39 AM By: [name] RN ?Entered By: [name] on 02/28/2022 13:14:32 ?Reckart, [place] ([id number]) ?-------------------------------------------------------------------------------- ?Encounter Discharge Information Details ?Patient Name: [name], [name] ?Date of Service: 02/28/2022 1:15 PM ?Medical Record Number: [id number] ?Patient Account Number: [id number] ?Date of Birth/Sex: 1966-03-20 (56 y.o. M) ?Treating RN: [name] ?Primary Care Karl Knarr: [name] Other Clinician: ?Referring Joyell Emami: [name] ?Treating Lynisha Osuch/Extender: Jeri Cos ?Weeks in Treatment: 17 ?Encounter Discharge Information Items Post Procedure Vitals ?Discharge Condition: Stable ?Temperature (?F): 98.3 ?Ambulatory Status: Ambulatory ?Pulse (bpm): 72 ?Discharge Destination: Home ?Respiratory Rate (breaths/min): 18 ?Transportation: Private Auto ?Blood Pressure (mmHg): 118/78 ?Accompanied By: self ?Schedule Follow-up Appointment: Yes ?Clinical Summary of Care: Patient Declined ?Electronic Signature(s) ?Signed: 03/04/2022 10:35:39 AM By: [name] RN ?Entered By: [name] on 02/28/2022 13:16:29 ?[place], [place] ([id number]) ?-------------------------------------------------------------------------------- ?Lower Extremity Assessment Details ?Patient Name: [name], [name] ?Date of Service: 02/28/2022 1:15 PM ?Medical Record Number: [id number] ?Patient Account Number: [id number] ?Date of Birth/Sex: [birth year] (56 y.o. M) ?Treating RN: [name] ?Primary Care Ferris Tally: [name] Other Clinician: ?Referring Kayleann Mccaffery: [name] ?Treating Tenika Keeran/Extender: Jeri Cos ?Weeks in Treatment: 17 ?Electronic Signature(s) ?Signed: 03/04/2022 10:35:39 AM By: [name] RN ?Entered By:  [name] on 02/28/2022 12:58:19 ?[place], [place] ([id number]) ?-------------------------------------------------------------------------------- ?Multi Wound Chart Details ?Patient Name: Varricchio, Kastiel L. ?Date of Service: 02/28/2022 1:15 PM ?Medical Record Number: [id number] ?Patient Account Number: [id number] ?Date of Birth/Sex: [birth year] (56 y.o. 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 ?Extremity ?Comorbid History: Cataracts, Anemia, Sleep Apnea, Cataracts, Anemia, Sleep Apnea, N/A ?Arrhythmia, Congestive Heart Arrhythmia, Congestive Heart ?Failure, Coronary Artery Disease, Failure, Coronary Artery Disease, ?Hypertension, Myocardial Infarction, Hypertension, Myocardial Infarction, ?Type II Diabetes, Neuropathy, Type II Diabetes, Neuropathy, ?Seizure Disorder Seizure Disorder ?Date Acquired: 09/09/2021 10/28/2021 N/A ?Weeks of Treatment: 14 13 N/A ?Wound Status: Open Open N/A ?Wound Recurrence: No No N/A ?Measurements L x W x D (cm) 3.2x0.8x0.7 4x2x0.5 N/A ?Area (cm?) : 2.011 6.283 N/A ?Volume (cm?) : 1.407 3.142 N/A ?% Reduction in Area: 85.80% -100.00% N/A ?% Reduction in Volume: 96.60% -900.60% N/A ?Classification: Full Thickness With Exposed Grade 2 N/A ?Support Structures ?Exudate Amount: Medium Medium N/A ?Exudate Type: Serous Serosanguineous N/A ?Exudate Color: amber red, brown N/A ?Granulation Amount: Medium (34-66%) Small (1-33%) N/A ?Granulation Quality: Red, Pink Red N/A ?Necrotic  Amount: Medium (34-66%) Large (67-100%) N/A ?Necrotic Tissue: Adherent [place]oulder CityExposed Structures: ?Fat Layer (Subcutaneous Tissue): Fat Layer (Subcutaneous Tissue): N/A ?Yes Yes

## 2022-03-04 NOTE — Progress Notes (Signed)
[place], [place] ([id number]) ?Visit Report for 03/03/2022 ?HBO Details ?Patient Name: [name], [name] ?Date of Service: 03/03/2022 11:00 AM ?Medical Record Number: [id number] ?Patient Account Number: [id number] ?Date of Birth/Sex: 1966-08-31 (56 y.o. M) ?Treating RN: [name] ?Primary Care Mekaela Azizi: [name] ?Other Clinician: Jacqulyn Bath ?Referring Merl Guardino: [name] ?Treating Meranda Dechaine/Extender: Jeri Cos ?Weeks in Treatment: 17 ?HBO Treatment Course Details ?Treatment Course Number: 1 ?Ordering Juanette Urizar: Jeri Cos ?Total Treatments Ordered: 80 ?HBO Treatment Start Date: 11/04/2021 ?HBO Indication: ?Chronic Refractory Osteomyelitis to Left Foot ?HBO Treatment Details ?Treatment Number: 74 ?Patient Type: Outpatient ?Chamber Type: Monoplace ?Chamber Serial #: [id number] ?Treatment Protocol: 2.0 ATA with 90 minutes oxygen, and no air breaks ?Treatment Details ?Compression Rate Down: 1.0 psi / minute ?De-Compression Rate Up: 1.5 psi / minute ?Compress Tx Pressure Air breaks and breathing periods Decompress Decompress ?Begins Reached (leave unused spaces blank) Begins Ends ?Chamber Pressure (ATA) 1 2 - - - - - - 2 1 ?Clock Time (24 hr) 10:57 11:13 - - - - - - 12:43 12:53 ?Treatment Length: 116 (minutes) ?Treatment Segments: 4 ?Vital Signs ?Capillary Blood Glucose Reference Range: 80 - 120 mg / dl ?HBO Diabetic Blood Glucose Intervention Range: <131 mg/dl or >249 mg/dl ?Time Vitals Blood Respiratory Capillary Blood Glucose Pulse Action ?Type: ?Pulse: Temperature: ?Taken: ?Pressure: ?Rate: ?Glucose (mg/dl): ?Meter #: Oximetry (%) Taken: ?Pre 09:53 128/72 78 16 98.4 177 1 none per protocol ?Post 13:05 130/70 72 16 97.9 158 1 none per protocol ?Treatment Response ?Treatment Toleration: Well ?Treatment Completion ?Treatment Completed without Adverse Event ?Status: ?Electronic Signature(s) ?Signed: 03/03/2022 2:16:35 PM By: Enedina Finner RCP, RRT, CHT ?Signed: 03/03/2022 5:23:27 PM By: Worthy Keeler  PA-C ?Entered By: Enedina Finner on 03/03/2022 14:13:54 ?Dehn, [place] ([id number]) ?-------------------------------------------------------------------------------- ?HBO Safety Checklist Details ?Patient Name: [name], [name] ?Date of Service: 03/03/2022 11:00 AM ?Medical Record Number: [id number] ?Patient Account Number: [id number] ?Date of Birth/Sex: Jul 29, 1966 (56 y.o. M) ?Treating RN: [name] ?Primary Care Ame Heagle: [name] ?Other Clinician: Jacqulyn Bath ?Referring Slater Mcmanaman: [name] ?Treating Jerzi Tigert/Extender: Jeri Cos ?Weeks in Treatment: 17 ?HBO Safety Checklist Items ?Safety Checklist ?Consent Form Signed ?Patient voided / foley secured and emptied ?When did you last eato 09:30 am ?Last dose of injectable or oral agent n/a ?Ostomy pouch emptied and vented if applicable ?NA ?All implantable devices assessed, documented and approved ?NA ?Intravenous access site secured and place ?NA ?Valuables secured ?Linens and cotton and cotton/polyester blend (less than 51% polyester) ?Personal oil-based products / skin lotions / body lotions removed ?Wigs or hairpieces removed ?NA ?Smoking or tobacco materials removed ?NA ?Books / newspapers / magazines / loose paper removed ?NA ?Cologne, aftershave, perfume and deodorant removed ?Jewelry removed (may wrap wedding band) ?Make-up removed ?NA ?Hair care products removed ?Battery operated devices (external) removed ?NA ?Heating patches and chemical warmers removed ?NA ?Titanium eyewear removed ?NA ?Nail polish cured greater than 10 hours ?NA ?Casting material cured greater than 10 hours ?NA ?Hearing aids removed ?NA ?Loose dentures or partials removed ?NA ?Prosthetics have been removed ?NA ?Patient demonstrates correct use of air break device (if applicable) ?Patient concerns have been addressed ?Patient grounding bracelet on and cord attached to chamber ?Specifics for Inpatients (complete in addition to ?above) ?Medication sheet sent with  patient ?Intravenous medications needed or due during therapy sent with patient ?Drainage tubes (e.g. nasogastric tube or chest tube secured and vented) ?Endotracheal or Tracheotomy tube secured ?Cuff deflated of air and inflated with saline ?Airway suctioned ?Electronic Signature(s) ?Signed: 03/03/2022  2:16:35 PM By: Enedina Finner RCP, RRT, CHT ?Entered By: Enedina Finner on 03/03/2022 11:39:21 ?

## 2022-03-04 NOTE — Progress Notes (Signed)
Z[place]z, [place] ([id number]) ?Visit Report for 03/04/2022 ?Arrival Information Details ?Patient Name: [name], [name] ?Date of Service: 03/04/2022 9:00 AM ?Medical Record Number: [id number] ?Patient Account Number: [id number] ?Date of Birth/Sex: 1966/08/20 (56 y.o. M) ?Treating RN: [name] ?Primary Care Koltyn Kelsay: [name] ?Other Clinician: Jacqulyn Bath ?Referring Astha Probasco: [name] ?Treating Sahand Gosch/Extender: Jeri Cos ?Weeks in Treatment: 17 ?Visit Information History Since Last Visit ?Added or deleted any medications: No ?Patient Arrived: Ambulatory ?Any new allergies or adverse reactions: No ?Arrival Time: 08:55 ?Had a fall or experienced change in No ?Accompanied By: self ?activities of daily living that may affect ?Transfer Assistance: None ?risk of falls: ?Patient Identification Verified: Yes ?Signs or symptoms of abuse/neglect since last visito No ?Secondary Verification Process Completed: Yes ?Hospitalized since last visit: No ?Patient Requires Transmission-Based No ?Implantable device outside of the clinic excluding No ?Precautions: ?cellular tissue based products placed in the center ?Patient Has Alerts: Yes ?since last visit: ?Patient Alerts: Patient on Blood ?Has Dressing in Place as Prescribed: Yes ?Thinner ?Has Footwear/Offloading in Place as Prescribed: Yes ?Left: Other:Defender Boot ?Pain Present Now: No ?Electronic Signature(s) ?Signed: 03/04/2022 12:19:12 PM By: Enedina Finner RCP, RRT, CHT ?Entered By: Enedina Finner on 03/04/2022 08:58:18 ?[place], [place] ([id number]) ?-------------------------------------------------------------------------------- ?Encounter Discharge Information Details ?Patient Name: [name], [name] ?Date of Service: 03/04/2022 9:00 AM ?Medical Record Number: [id number] ?Patient Account Number: [id number] ?Date of Birth/Sex: August 28, 1966 (56 y.o. M) ?Treating RN: [name] ?Primary Care Orchid Glassberg: [name] ?Other Clinician: Jacqulyn Bath ?Referring Tashya Alberty: [name] ?Treating Daryus Sowash/Extender: Jeri Cos ?Weeks in Treatment: 17 ?Encounter Discharge Information Items ?Discharge Condition: Stable ?Ambulatory Status: Ambulatory ?Discharge Destination: Home ?Transportation: Private Auto ?Accompanied By: self ?Schedule Follow-up Appointment: Yes ?Clinical Summary of Care: ?Notes ?Patient has an HBO treatment scheduled on 03/05/22 at 11:00 am. ?Electronic Signature(s) ?Signed: 03/04/2022 12:19:12 PM By: Enedina Finner RCP, RRT, CHT ?Entered By: Enedina Finner on 03/04/2022 12:16:44 ?[place], [place] ([id number]) ?-------------------------------------------------------------------------------- ?Vitals Details ?Patient Name: [name], [name] ?Date of Service: 03/04/2022 9:00 AM ?Medical Record Number: [id number] ?Patient Account Number: [id number] ?Date of Birth/Sex: 06-Jan-1966 (56 y.o. M) ?Treating RN: [name] ?Primary Care Adley Castello: [name] ?Other Clinician: Jacqulyn Bath ?Referring Shyam Dawson: [name] ?Treating Girtrude Enslin/Extender: Jeri Cos ?Weeks in Treatment: 17 ?Vital Signs ?Time Taken: 09:02 ?Temperature (??F): 97.9 ?Height (in): 70 ?Pulse (bpm): 72 ?Weight (lbs): 180.4 ?Respiratory Rate (breaths/min): 16 ?Body Mass Index (BMI): 25.9 ?Blood Pressure (mmHg): 132/68 ?Capillary Blood Glucose (mg/dl): 158 ?Reference Range: 80 - 120 mg / dl ?Electronic Signature(s) ?Signed: 03/04/2022 12:19:12 PM By: Enedina Finner RCP, RRT, CHT ?Entered By: Enedina Finner on 03/04/2022 09:11:13 ?

## 2022-03-04 NOTE — Progress Notes (Signed)
M[place], [place] ([id number]) ?Visit Report for 03/04/2022 ?HBO Details ?Patient Name: [name], [name] ?Date of Service: 03/04/2022 9:00 AM ?Medical Record Number: [id number] ?Patient Account Number: [id number] ?Date of Birth/Sex: 08/28/1966 (56 y.o. M) ?Treating RN: [name] ?Primary Care Shayana Hornstein: [name] ?Other Clinician: Jacqulyn Bath ?Referring Nakeyia Menden: [name] ?Treating Guinevere Stephenson/Extender: Jeri Cos ?Weeks in Treatment: 17 ?HBO Treatment Course Details ?Treatment Course Number: 1 ?Ordering Stassi Fadely: Jeri Cos ?Total Treatments Ordered: 80 ?HBO Treatment Start Date: 11/04/2021 ?HBO Indication: ?Chronic Refractory Osteomyelitis to Left Foot ?HBO Treatment Details ?Treatment Number: 41 ?Patient Type: Outpatient ?Chamber Type: Monoplace ?Chamber Serial #: [id number] ?Treatment Protocol: 2.0 ATA with 90 minutes oxygen, and no air breaks ?Treatment Details ?Compression Rate Down: 1.0 psi / minute ?De-Compression Rate Up: 1.5 psi / minute ?Compress Tx Pressure Air breaks and breathing periods Decompress Decompress ?Begins Reached (leave unused spaces blank) Begins Ends ?Chamber Pressure (ATA) 1 2 - - - - - - 2 1 ?Clock Time (24 hr) 09:09 09:23 - - - - - - 10:53 11:03 ?Treatment Length: 114 (minutes) ?Treatment Segments: 4 ?Vital Signs ?Capillary Blood Glucose Reference Range: 80 - 120 mg / dl ?HBO Diabetic Blood Glucose Intervention Range: <131 mg/dl or >249 mg/dl ?Time Vitals Blood Respiratory Capillary Blood Glucose Pulse Action ?Type: ?Pulse: Temperature: ?Taken: ?Pressure: ?Rate: ?Glucose (mg/dl): ?Meter #: Oximetry (%) Taken: ?Pre 09:02 132/68 72 16 97.9 158 1 none per protocol ?Post 11:06 128/74 72 16 98 202 1 none per protocol ?Treatment Response ?Treatment Toleration: Well ?Treatment Completion ?Treatment Completed without Adverse Event ?Status: ?Electronic Signature(s) ?Signed: 03/04/2022 12:19:12 PM By: Enedina Finner RCP, RRT, CHT ?Signed: 03/04/2022 4:58:37 PM By: Worthy Keeler  PA-C ?Entered By: Enedina Finner on 03/04/2022 12:15:28 ?[place], [place] ([id number]) ?-------------------------------------------------------------------------------- ?HBO Safety Checklist Details ?Patient Name: [name], [name] ?Date of Service: 03/04/2022 9:00 AM ?Medical Record Number: [id number] ?Patient Account Number: [id number] ?Date of Birth/Sex: Dec 06, 1965 (56 y.o. M) ?Treating RN: [name] ?Primary Care Wynn Alldredge: [name] ?Other Clinician: Jacqulyn Bath ?Referring Mattew Chriswell: [name] ?Treating Lelia Jons/Extender: Jeri Cos ?Weeks in Treatment: 17 ?HBO Safety Checklist Items ?Safety Checklist ?Consent Form Signed ?Patient voided / foley secured and emptied ?When did you last eato 08:30 am ?Last dose of injectable or oral agent n/a ?Ostomy pouch emptied and vented if applicable ?NA ?All implantable devices assessed, documented and approved ?NA ?Intravenous access site secured and place ?NA ?Valuables secured ?Linens and cotton and cotton/polyester blend (less than 51% polyester) ?Personal oil-based products / skin lotions / body lotions removed ?Wigs or hairpieces removed ?NA ?Smoking or tobacco materials removed ?NA ?Books / newspapers / magazines / loose paper removed ?NA ?Cologne, aftershave, perfume and deodorant removed ?Jewelry removed (may wrap wedding band) ?Make-up removed ?NA ?Hair care products removed ?Battery operated devices (external) removed ?NA ?Heating patches and chemical warmers removed ?NA ?Titanium eyewear removed ?NA ?Nail polish cured greater than 10 hours ?NA ?Casting material cured greater than 10 hours ?NA ?Hearing aids removed ?NA ?Loose dentures or partials removed ?NA ?Prosthetics have been removed ?NA ?Patient demonstrates correct use of air break device (if applicable) ?Patient concerns have been addressed ?Patient grounding bracelet on and cord attached to chamber ?Specifics for Inpatients (complete in addition to ?above) ?Medication sheet sent with  patient ?Intravenous medications needed or due during therapy sent with patient ?Drainage tubes (e.g. nasogastric tube or chest tube secured and vented) ?Endotracheal or Tracheotomy tube secured ?Cuff deflated of air and inflated with saline ?Airway suctioned ?Electronic Signature(s) ?Signed: 03/04/2022  12:19:12 PM By: Enedina Finner RCP, RRT, CHT ?Entered By: Enedina Finner on 03/04/2022 09:12:14 ?

## 2022-03-05 ENCOUNTER — Encounter (HOSPITAL_BASED_OUTPATIENT_CLINIC_OR_DEPARTMENT_OTHER): Payer: Medicare Other | Admitting: Internal Medicine

## 2022-03-05 DIAGNOSIS — I2510 Atherosclerotic heart disease of native coronary artery without angina pectoris: Secondary | ICD-10-CM | POA: Diagnosis not present

## 2022-03-05 DIAGNOSIS — E1151 Type 2 diabetes mellitus with diabetic peripheral angiopathy without gangrene: Secondary | ICD-10-CM | POA: Diagnosis not present

## 2022-03-05 DIAGNOSIS — I120 Hypertensive chronic kidney disease with stage 5 chronic kidney disease or end stage renal disease: Secondary | ICD-10-CM | POA: Diagnosis not present

## 2022-03-05 DIAGNOSIS — E11621 Type 2 diabetes mellitus with foot ulcer: Secondary | ICD-10-CM

## 2022-03-05 DIAGNOSIS — Z4781 Encounter for orthopedic aftercare following surgical amputation: Secondary | ICD-10-CM | POA: Diagnosis not present

## 2022-03-05 DIAGNOSIS — N186 End stage renal disease: Secondary | ICD-10-CM | POA: Diagnosis not present

## 2022-03-05 DIAGNOSIS — M86672 Other chronic osteomyelitis, left ankle and foot: Secondary | ICD-10-CM

## 2022-03-05 DIAGNOSIS — N2581 Secondary hyperparathyroidism of renal origin: Secondary | ICD-10-CM | POA: Diagnosis not present

## 2022-03-05 DIAGNOSIS — L97524 Non-pressure chronic ulcer of other part of left foot with necrosis of bone: Secondary | ICD-10-CM

## 2022-03-05 DIAGNOSIS — I96 Gangrene, not elsewhere classified: Secondary | ICD-10-CM | POA: Diagnosis not present

## 2022-03-05 DIAGNOSIS — L02612 Cutaneous abscess of left foot: Secondary | ICD-10-CM | POA: Diagnosis not present

## 2022-03-05 DIAGNOSIS — E1122 Type 2 diabetes mellitus with diabetic chronic kidney disease: Secondary | ICD-10-CM | POA: Diagnosis not present

## 2022-03-05 DIAGNOSIS — I110 Hypertensive heart disease with heart failure: Secondary | ICD-10-CM | POA: Diagnosis not present

## 2022-03-05 DIAGNOSIS — I5042 Chronic combined systolic (congestive) and diastolic (congestive) heart failure: Secondary | ICD-10-CM | POA: Diagnosis not present

## 2022-03-05 DIAGNOSIS — Z992 Dependence on renal dialysis: Secondary | ICD-10-CM | POA: Diagnosis not present

## 2022-03-05 DIAGNOSIS — D631 Anemia in chronic kidney disease: Secondary | ICD-10-CM | POA: Diagnosis not present

## 2022-03-05 LAB — GLUCOSE, CAPILLARY
Glucose-Capillary: 186 mg/dL — ABNORMAL HIGH (ref 70–99)
Glucose-Capillary: 159 mg/dL — ABNORMAL HIGH (ref 70–99)

## 2022-03-05 NOTE — Progress Notes (Signed)
[place], [place] ([id number]) ?Visit Report for 03/05/2022 ?HBO Details ?Patient Name: [name], [name] ?Date of Service: 03/05/2022 11:00 AM ?Medical Record Number: [id number] ?Patient Account Number: [id number] ?Date of Birth/Sex: 09-03-1966 (56 y.o. M) ?Treating RN: [name] ?Primary Care Derrien Anschutz: [name] ?Other Clinician: Jacqulyn Bath ?Referring Lacoya Wilbanks: [name] ?Treating Devota Viruet/Extender: Kalman Shan ?Weeks in Treatment: 17 ?HBO Treatment Course Details ?Treatment Course Number: 1 ?Ordering Anaelle Dunton: Jeri Cos ?Total Treatments Ordered: 80 ?HBO Treatment Start Date: 11/04/2021 ?HBO Indication: ?Chronic Refractory Osteomyelitis to Left Foot ?HBO Treatment Details ?Treatment Number: 22 ?Patient Type: Outpatient ?Chamber Type: Monoplace ?Chamber Serial #: [id number] ?Treatment Protocol: 2.0 ATA with 90 minutes oxygen, and no air breaks ?Treatment Details ?Compression Rate Down: 1.0 psi / minute ?De-Compression Rate Up: 1.5 psi / minute ?Compress Tx Pressure Air breaks and breathing periods Decompress Decompress ?Begins Reached (leave unused spaces blank) Begins Ends ?Chamber Pressure (ATA) 1 2 - - - - - - 2 1 ?Clock Time (24 hr) 10:22 10:39 - - - - - - 12:09 12:19 ?Treatment Length: 117 (minutes) ?Treatment Segments: 4 ?Vital Signs ?Capillary Blood Glucose Reference Range: 80 - 120 mg / dl ?HBO Diabetic Blood Glucose Intervention Range: <131 mg/dl or >249 mg/dl ?Time Vitals Blood Respiratory Capillary Blood Glucose Pulse Action ?Type: ?Pulse: Temperature: ?Taken: ?Pressure: ?Rate: ?Glucose (mg/dl): ?Meter #: Oximetry (%) Taken: ?Pre 10:13 128/72 78 16 97.7 186 ?Post 12:25 126/70 72 16 97.7 159 1 none per protocol ?Treatment Response ?Treatment Toleration: Well ?Treatment Completion ?Treatment Completed without Adverse Event ?Status: ?HBO Attestation ?I certify that I supervised this HBO treatment in accordance with ?Medicare guidelines. A trained emergency response team is readily Yes ?available  per hospital policies and procedures. ?Continue HBOT as ordered. Yes ?Electronic Signature(s) ?Signed: 03/05/2022 3:38:39 PM By: Kalman Shan DO ?Previous Signature: 03/05/2022 2:49:02 PM Version By: Enedina Finner RCP, RRT, CHT ?Entered By: Kalman Shan on 03/05/2022 15:30:04 ?[place], [place] ([id number]) ?-------------------------------------------------------------------------------- ?HBO Safety Checklist Details ?Patient Name: [name], [name] ?Date of Service: 03/05/2022 11:00 AM ?Medical Record Number: [id number] ?Patient Account Number: [id number] ?Date of Birth/Sex: 03-25-1966 (56 y.o. M) ?Treating RN: [name] ?Primary Care Robie Mcniel: [name] ?Other Clinician: Jacqulyn Bath ?Referring Wylie Russon: [name] ?Treating Raylyn Speckman/Extender: Kalman Shan ?Weeks in Treatment: 17 ?HBO Safety Checklist Items ?Safety Checklist ?Consent Form Signed ?Patient voided / foley secured and emptied ?When did you last eato 09:30 am ?Last dose of injectable or oral agent n/a ?Ostomy pouch emptied and vented if applicable ?NA ?All implantable devices assessed, documented and approved ?NA ?Intravenous access site secured and place ?NA ?Valuables secured ?Linens and cotton and cotton/polyester blend (less than 51% polyester) ?Personal oil-based products / skin lotions / body lotions removed ?Wigs or hairpieces removed ?NA ?Smoking or tobacco materials removed ?NA ?Books / newspapers / magazines / loose paper removed ?NA ?Cologne, aftershave, perfume and deodorant removed ?Jewelry removed (may wrap wedding band) ?Make-up removed ?NA ?Hair care products removed ?Battery operated devices (external) removed ?NA ?Heating patches and chemical warmers removed ?NA ?Titanium eyewear removed ?NA ?Nail polish cured greater than 10 hours ?NA ?Casting material cured greater than 10 hours ?NA ?Hearing aids removed ?NA ?Loose dentures or partials removed ?NA ?Prosthetics have been removed ?NA ?Patient demonstrates  correct use of air break device (if applicable) ?Patient concerns have been addressed ?Patient grounding bracelet on and cord attached to chamber ?Specifics for Inpatients (complete in addition to ?above) ?Medication sheet sent with patient ?Intravenous medications needed or due during therapy sent with patient ?  Drainage tubes (e.g. nasogastric tube or chest tube secured and vented) ?Endotracheal or Tracheotomy tube secured ?Cuff deflated of air and inflated with saline ?Airway suctioned ?Electronic Signature(s) ?Signed: 03/05/2022 2:49:02 PM By: Enedina Finner RCP, RRT, CHT ?Entered By: Enedina Finner on 03/05/2022 10:28:46 ?

## 2022-03-05 NOTE — Progress Notes (Signed)
Rubano, [place] ([id number]) ?Visit Report for 03/05/2022 ?Arrival Information Details ?Patient Name: Batterman, Tin L. ?Date of Service: 03/05/2022 11:00 AM ?Medical Record Number: [id number] ?Patient Account Number: [id number] ?Date of Birth/Sex: 1965/12/05 (56 y.o. M) ?Treating RN: [name] ?Primary Care Bowie Doiron: [name] ?Other Clinician: Jacqulyn Bath ?Referring Dewie Ahart: [name] ?Treating Xavi Tomasik/Extender: Kalman Shan ?Weeks in Treatment: 17 ?Visit Information History Since Last Visit ?Added or deleted any medications: No ?Patient Arrived: Ambulatory ?Any new allergies or adverse reactions: No ?Arrival Time: 10:08 ?Had a fall or experienced change in No ?Accompanied By: self ?activities of daily living that may affect ?Transfer Assistance: None ?risk of falls: ?Patient Identification Verified: Yes ?Signs or symptoms of abuse/neglect since last visito No ?Secondary Verification Process Completed: Yes ?Hospitalized since last visit: No ?Patient Requires Transmission-Based No ?Implantable device outside of the clinic excluding No ?Precautions: ?cellular tissue based products placed in the center ?Patient Has Alerts: Yes ?since last visit: ?Patient Alerts: Patient on Blood ?Pain Present Now: No ?Thinner ?Electronic Signature(s) ?Signed: 03/05/2022 2:49:02 PM By: Enedina Finner RCP, RRT, CHT ?Entered By: Enedina Finner on 03/05/2022 10:26:59 ?[place], [place] ([id number]) ?-------------------------------------------------------------------------------- ?Encounter Discharge Information Details ?Patient Name: Nolt, Lio L. ?Date of Service: 03/05/2022 11:00 AM ?Medical Record Number: [id number] ?Patient Account Number: [id number] ?Date of Birth/Sex: 27-Sep-1966 (56 y.o. M) ?Treating RN: [name] ?Primary Care Keimani Laufer: [name] ?Other Clinician: Jacqulyn Bath ?Referring Letoya Stallone: [name] ?Treating Shelba Susi/Extender: Kalman Shan ?Weeks in Treatment: 17 ?Encounter  Discharge Information Items ?Discharge Condition: Stable ?Ambulatory Status: Ambulatory ?Discharge Destination: Home ?Transportation: Private Auto ?Accompanied By: self ?Schedule Follow-up Appointment: Yes ?Clinical Summary of Care: ?Notes ?Patient has an HBO treatment scheduled on 03/06/22 at 08:00 am. ?Electronic Signature(s) ?Signed: 03/05/2022 2:49:02 PM By: Enedina Finner RCP, RRT, CHT ?Entered By: Enedina Finner on 03/05/2022 14:38:07 ?Sui, [place] ([id number]) ?-------------------------------------------------------------------------------- ?Vitals Details ?Patient Name: Twyman, Jaxton L. ?Date of Service: 03/05/2022 11:00 AM ?Medical Record Number: [id number] ?Patient Account Number: [id number] ?Date of Birth/Sex: 15-Oct-1966 (56 y.o. M) ?Treating RN: [name] ?Primary Care Shalee Paolo: [name] ?Other Clinician: Jacqulyn Bath ?Referring Scherrie Seneca: [name] ?Treating Tatym Schermer/Extender: Kalman Shan ?Weeks in Treatment: 17 ?Vital Signs ?Time Taken: 10:13 ?Temperature (??F): 97.7 ?Height (in): 70 ?Pulse (bpm): 78 ?Weight (lbs): 180.4 ?Respiratory Rate (breaths/min): 16 ?Body Mass Index (BMI): 25.9 ?Blood Pressure (mmHg): 128/72 ?Capillary Blood Glucose (mg/dl): 186 ?Reference Range: 80 - 120 mg / dl ?Electronic Signature(s) ?Signed: 03/05/2022 2:49:02 PM By: Enedina Finner RCP, RRT, CHT ?Entered By: Enedina Finner on 03/05/2022 10:27:42 ?

## 2022-03-06 ENCOUNTER — Encounter: Payer: Medicare Other | Admitting: Physician Assistant

## 2022-03-06 DIAGNOSIS — I2510 Atherosclerotic heart disease of native coronary artery without angina pectoris: Secondary | ICD-10-CM | POA: Diagnosis not present

## 2022-03-06 DIAGNOSIS — L97524 Non-pressure chronic ulcer of other part of left foot with necrosis of bone: Secondary | ICD-10-CM | POA: Diagnosis not present

## 2022-03-06 DIAGNOSIS — M86672 Other chronic osteomyelitis, left ankle and foot: Secondary | ICD-10-CM | POA: Diagnosis not present

## 2022-03-06 DIAGNOSIS — E11621 Type 2 diabetes mellitus with foot ulcer: Secondary | ICD-10-CM | POA: Diagnosis not present

## 2022-03-06 DIAGNOSIS — I110 Hypertensive heart disease with heart failure: Secondary | ICD-10-CM | POA: Diagnosis not present

## 2022-03-06 DIAGNOSIS — I5042 Chronic combined systolic (congestive) and diastolic (congestive) heart failure: Secondary | ICD-10-CM | POA: Diagnosis not present

## 2022-03-06 LAB — GLUCOSE, CAPILLARY
Glucose-Capillary: 223 mg/dL — ABNORMAL HIGH (ref 70–99)
Glucose-Capillary: 136 mg/dL — ABNORMAL HIGH (ref 70–99)

## 2022-03-07 ENCOUNTER — Encounter: Payer: Medicare Other | Admitting: Physician Assistant

## 2022-03-07 DIAGNOSIS — N2581 Secondary hyperparathyroidism of renal origin: Secondary | ICD-10-CM | POA: Diagnosis not present

## 2022-03-07 DIAGNOSIS — M86672 Other chronic osteomyelitis, left ankle and foot: Secondary | ICD-10-CM | POA: Diagnosis not present

## 2022-03-07 DIAGNOSIS — N186 End stage renal disease: Secondary | ICD-10-CM | POA: Diagnosis not present

## 2022-03-07 DIAGNOSIS — I2510 Atherosclerotic heart disease of native coronary artery without angina pectoris: Secondary | ICD-10-CM | POA: Diagnosis not present

## 2022-03-07 DIAGNOSIS — L97524 Non-pressure chronic ulcer of other part of left foot with necrosis of bone: Secondary | ICD-10-CM | POA: Diagnosis not present

## 2022-03-07 DIAGNOSIS — I5042 Chronic combined systolic (congestive) and diastolic (congestive) heart failure: Secondary | ICD-10-CM | POA: Diagnosis not present

## 2022-03-07 DIAGNOSIS — D631 Anemia in chronic kidney disease: Secondary | ICD-10-CM | POA: Diagnosis not present

## 2022-03-07 DIAGNOSIS — I110 Hypertensive heart disease with heart failure: Secondary | ICD-10-CM | POA: Diagnosis not present

## 2022-03-07 DIAGNOSIS — L97522 Non-pressure chronic ulcer of other part of left foot with fat layer exposed: Secondary | ICD-10-CM | POA: Diagnosis not present

## 2022-03-07 DIAGNOSIS — E11621 Type 2 diabetes mellitus with foot ulcer: Secondary | ICD-10-CM | POA: Diagnosis not present

## 2022-03-07 DIAGNOSIS — I96 Gangrene, not elsewhere classified: Secondary | ICD-10-CM | POA: Diagnosis not present

## 2022-03-07 DIAGNOSIS — L02612 Cutaneous abscess of left foot: Secondary | ICD-10-CM | POA: Diagnosis not present

## 2022-03-07 DIAGNOSIS — Z992 Dependence on renal dialysis: Secondary | ICD-10-CM | POA: Diagnosis not present

## 2022-03-07 LAB — GLUCOSE, CAPILLARY
Glucose-Capillary: 136 mg/dL — ABNORMAL HIGH (ref 70–99)
Glucose-Capillary: 180 mg/dL — ABNORMAL HIGH (ref 70–99)

## 2022-03-07 NOTE — Progress Notes (Signed)
[name], [name] ([id number]) Visit Report for 03/06/2022 Arrival Information Details Patient Name: [name], [name]. Date of Service: 03/06/2022 8:00 AM Medical Record Number: [id number] Patient Account Number: [id number] Date of Birth/Sex: 25-Jul-1966 (56 y.o. M) Treating RN: [name] Primary Care Sheyli Horwitz: [name] Other Clinician: Jacqulyn Bath Referring Caydn Justen: [name] Treating Berlin Mokry/Extender: [name] in Treatment: 18 Visit Information History Since Last Visit Added or deleted any medications: No Patient Arrived: Ambulatory Any new allergies or adverse reactions: No Arrival Time: 07:55 Had a fall or experienced change in No Accompanied By: self activities of daily living that may affect Transfer Assistance: None risk of falls: Patient Identification Verified: Yes Signs or symptoms of abuse/neglect since last visito No Secondary Verification Process Completed: Yes Hospitalized since last visit: No Patient Requires Transmission-Based No Implantable device outside of the clinic excluding No Precautions: cellular tissue based products placed in the center Patient Has Alerts: Yes since last visit: Patient Alerts: Patient on Blood Has Dressing in Place as Prescribed: Yes Thinner Has Footwear/Offloading in Place as Prescribed: Yes Left: Other:Defender Boot Pain Present Now: No Electronic Signature(s) Signed: 03/06/2022 3:48:59 PM By: Enedina Finner RCP, RRT, CHT Entered By: Stark Jock, Amado Nash on 03/06/2022 08:46:10 [name], [name] ([id number]) -------------------------------------------------------------------------------- Encounter Discharge Information Details Patient Name: [name], [name] Date of Service: 03/06/2022 8:00 AM Medical Record Number: [id number] Patient Account Number: [id number] Date of Birth/Sex: [birth year] (56 y.o. M) Treating RN: [name] Primary Care Munachimso Palin: [name] Other Clinician: Jacqulyn Bath Referring Abed Schar: [name] Treating Azarya Oconnell/Extender: [name] in Treatment: 18 Encounter Discharge Information Items Discharge Condition: Stable Ambulatory Status: Ambulatory Discharge Destination: Home Transportation: Private Auto Accompanied By: self Schedule Follow-up Appointment: Yes Clinical Summary of Care: Notes Patient has an HBO treatment scheduled on 03/07/22 at 11:00 am. Electronic Signature(s) Signed: 03/06/2022 3:48:59 PM By: Enedina Finner RCP, RRT, CHT Entered By: Stark Jock, Amado Nash on 03/06/2022 11:34:05 [name], [name] ([id number]) -------------------------------------------------------------------------------- Vitals Details Patient Name: [name], [name] Date of Service: 03/06/2022 8:00 AM Medical Record Number: [id number] Patient Account Number: [id number] Date of Birth/Sex: [birth year] (55 y.o. M) Treating RN: [name] Primary Care Connor Foxworthy: [name] Other Clinician: Jacqulyn Bath Referring Haeden Hudock: [name] Treating Elantra Caprara/Extender: [name] in Treatment: 18 Vital Signs Time Taken: 08:00 Temperature (F): 98.2 Height (in): 70 Pulse (bpm): 78 Weight (lbs): 180.4 Respiratory Rate (breaths/min): 16 Body Mass Index (BMI): 25.9 Blood Pressure (mmHg): 128/68 Capillary Blood Glucose (mg/dl): 223 Reference Range: 80 - 120 mg / dl Electronic Signature(s) Signed: 03/06/2022 3:48:59 PM By: Enedina Finner RCP, RRT, CHT Entered By: Enedina Finner on 03/06/2022 08:46:57

## 2022-03-08 NOTE — Progress Notes (Addendum)
Banker, [place] ([id number]) Visit Report for 03/07/2022 Arrival Information Details Patient Name: [name], [name]. Date of Service: 03/07/2022 1:15 PM Medical Record Number: [id number] Patient Account Number: [id number] Date of Birth/Sex: 1966/05/01 (55 y.o. M) Treating RN: [name] Primary Care Cayetano Mikita: [name] Other Clinician: Referring Kishon Garriga: [name] Treating Jameil Whitmoyer/Extender: [name] in Treatment: 18 Visit Information History Since Last Visit All ordered tests and consults were completed: No Patient Arrived: Ambulatory Added or deleted any medications: No Arrival Time: 12:45 Any new allergies or adverse reactions: No Accompanied By: self Had a fall or experienced change in No Transfer Assistance: None activities of daily living that may affect Patient Identification Verified: Yes risk of falls: Secondary Verification Process Completed: Yes Signs or symptoms of abuse/neglect since last visito No Patient Requires Transmission-Based No Hospitalized since last visit: No Precautions: Implantable device outside of the clinic excluding No Patient Has Alerts: Yes cellular tissue based products placed in the center Patient Alerts: Patient on Blood since last visit: Thinner Has Dressing in Place as Prescribed: Yes Pain Present Now: No Electronic Signature(s) Signed: 03/10/2022 4:02:04 PM By: [name] RN Entered By: [name] on 03/07/2022 12:49:18 [name], [name]. ([id number]) -------------------------------------------------------------------------------- Clinic Level of Care Assessment Details Patient Name: [name], [name]. Date of Service: 03/07/2022 1:15 PM Medical Record Number: [id number] Patient Account Number: [id number] Date of Birth/Sex: [birth year] (56 y.o. M) Treating RN: [name] Primary Care Kevyn Boquet: [name] Other Clinician: Referring Gwendolen Hewlett: [name] Treating Chad Donoghue/Extender: [name] in Treatment:  18 Clinic Level of Care Assessment Items TOOL 1 Quantity Score '[]'  - Use when EandM and Procedure is performed on INITIAL visit 0 ASSESSMENTS - Nursing Assessment / Reassessment '[]'  - General Physical Exam (combine w/ comprehensive assessment (listed just below) when performed on new 0 pt. evals) '[]'  - 0 Comprehensive Assessment (HX, ROS, Risk Assessments, Wounds Hx, etc.) ASSESSMENTS - Wound and Skin Assessment / Reassessment '[]'  - Dermatologic / Skin Assessment (not related to wound area) 0 ASSESSMENTS - Ostomy and/or Continence Assessment and Care '[]'  - Incontinence Assessment and Management 0 '[]'  - 0 Ostomy Care Assessment and Management (repouching, etc.) PROCESS - Coordination of Care '[]'  - Simple Patient / Family Education for ongoing care 0 '[]'  - 0 Complex (extensive) Patient / Family Education for ongoing care '[]'  - 0 Staff obtains Programmer, systems, Records, Test Results / Process Orders '[]'  - 0 Staff telephones HHA, Nursing Homes / Clarify orders / etc '[]'  - 0 Routine Transfer to another Facility (non-emergent condition) '[]'  - 0 Routine Hospital Admission (non-emergent condition) '[]'  - 0 New Admissions / Biomedical engineer / Ordering NPWT, Apligraf, etc. '[]'  - 0 Emergency Hospital Admission (emergent condition) PROCESS - Special Needs '[]'  - Pediatric / Minor Patient Management 0 '[]'  - 0 Isolation Patient Management '[]'  - 0 Hearing / Language / Visual special needs '[]'  - 0 Assessment of Community assistance (transportation, D/C planning, etc.) '[]'  - 0 Additional assistance / Altered mentation '[]'  - 0 Support Surface(s) Assessment (bed, cushion, seat, etc.) INTERVENTIONS - Miscellaneous '[]'  - External ear exam 0 '[]'  - 0 Patient Transfer (multiple staff / Civil Service fast streamer / Similar devices) '[]'  - 0 Simple Staple / Suture removal (25 or less) '[]'  - 0 Complex Staple / Suture removal (26 or more) '[]'  - 0 Hypo/Hyperglycemic Management (do not check if billed separately) '[]'  - 0 Ankle /  Brachial Index (ABI) - do not check if billed separately Has the patient been seen at the hospital within the last three  years: Yes Total Score: 0 Level Of Care: ____ [name], [name]. ([id number]) Electronic Signature(s) Signed: 03/10/2022 4:02:04 PM By: [name] RN Entered By: [name] on 03/07/2022 13:13:41 [name], [name] ([id number]) -------------------------------------------------------------------------------- Encounter Discharge Information Details Patient Name: [name]. Date of Service: 03/07/2022 1:15 PM Medical Record Number: [id number] Patient Account Number: [id number] Date of Birth/Sex: Sep 08, 1966 (56 y.o. M) Treating RN: [name] Primary Care Bleu Moisan: [name] Other Clinician: Referring Daryle Boyington: [name] Treating Deyona Soza/Extender: [name] in Treatment: 18 Encounter Discharge Information Items Post Procedure Vitals Discharge Condition: Stable Temperature (F): 98.1 Ambulatory Status: Ambulatory Pulse (bpm): 66 Discharge Destination: Home Respiratory Rate (breaths/min): 16 Transportation: Private Auto Blood Pressure (mmHg): 132/68 Accompanied By: self Schedule Follow-up Appointment: Yes Clinical Summary of Care: Patient Declined Electronic Signature(s) Signed: 03/07/2022 1:46:59 PM By: [name] RN Entered By: [name] on 03/07/2022 13:46:59 [name], [name] ([id number]) -------------------------------------------------------------------------------- Lower Extremity Assessment Details Patient Name: [name]. Date of Service: 03/07/2022 1:15 PM Medical Record Number: [id number] Patient Account Number: [id number] Date of Birth/Sex: Apr 03, 1966 (56 y.o. M) Treating RN: [name] Primary Care Satya Buttram: [name] Other Clinician: Referring Ronav Furney: [name] Treating Maryum [name]/Extender: [name] in Treatment: 18 Electronic Signature(s) Signed: 03/10/2022 4:02:04 PM By: [name] RN Entered By:  [name] on 03/07/2022 12:59:05 Starner, Mung [name]. ([id number]) -------------------------------------------------------------------------------- Multi Wound Chart Details Patient Name: Geter, Yosiah [name]. Date of Service: 03/07/2022 1:15 PM Medical Record Number: [id number] Patient Account Number: [id number] Date of Birth/Sex: 12-17-1965 (56 y.o. 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 Comorbid History: Cataracts, Anemia, Sleep Apnea, Cataracts, Anemia, Sleep Apnea, N/A Arrhythmia, Congestive Heart Arrhythmia, Congestive Heart Failure, Coronary Artery Disease, Failure, Coronary Artery Disease, Hypertension, Myocardial Infarction, Hypertension, Myocardial Infarction, Type II Diabetes, Neuropathy, Type II Diabetes, Neuropathy, Seizure Disorder Seizure Disorder Date Acquired: 09/09/2021 10/28/2021 N/A Weeks of Treatment: 15 14 N/A Wound Status: Open Open N/A Wound Recurrence: No No N/A Measurements [name] x W x D (cm) 3x1x0.7 4x2.2x0.7 N/A Area (cm) : 2.356 6.912 N/A Volume (cm) : 1.649 4.838 N/A % Reduction in Area: 83.30% -120.00% N/A % Reduction in Volume: 96.00% -1440.80% N/A Classification: Full Thickness With Exposed Grade 2 N/A Support Structures Exudate Amount: Medium Medium N/A Exudate Type: Serous Serosanguineous N/A Exudate Color: amber red, brown N/A Granulation Amount: Medium (34-66%) Small (1-33%) N/A Granulation Quality: Red, Pink Red N/A Necrotic  Amount: Medium (34-66%) Large (67-100%) N/A Necrotic Tissue: Adherent Martha N/A Exposed Structures: Fat Layer (Subcutaneous Tissue): Fat Layer (Subcutaneous Tissue): N/A Yes Yes Fascia: No Tendon: Yes Tendon: No Joint: Yes Muscle: No Fascia: No Joint: No Muscle: No Bone: No Bone: No Epithelialization: None None N/A Treatment Notes Electronic Signature(s) [name], [name]. ([id number]) Signed: 03/10/2022 4:02:04 PM By: [name] RN Entered By: [name] on 03/07/2022 12:59:18 [name], [name] ([id number]) -------------------------------------------------------------------------------- [place] Details Patient Name: Ploeger, WESLIE PRETLOW. Date of Service: 03/07/2022 1:15 PM Medical Record Number: [id number] Patient Account Number: [id number] Date of Birth/Sex: 1966-08-22 (56 y.o. M) Treating RN: [name] Primary Care Lamarcus Spira: [name] Other Clinician: Referring Emanuela Runnion: Jeanie Cooks  [place]win Treating Anny Sayler/Extender: [name] in Treatment: 18 Active Inactive HBO Nursing Diagnoses: Anxiety related to feelings of confinement associated with the hyperbaric oxygen chamber Anxiety related to knowledge deficit of hyperbaric oxygen therapy and treatment procedures Discomfort related to temperature and humidity changes inside hyperbaric chamber Potential for barotraumas to ears, sinuses, teeth, and lungs or cerebral gas embolism related to changes in atmospheric pressure inside hyperbaric oxygen chamber Potential for oxygen toxicity seizures related to delivery of 100% oxygen at an increased atmospheric pressure Potential for pulmonary oxygen toxicity related to delivery of 100% oxygen at an increased atmospheric pressure Goals: Barotrauma will be prevented during HBO2 Date Initiated: 10/31/2021 Target Resolution Date: 12/06/2021 Goal Status: Active Patient and/or family will be able to state/discuss factors appropriate to the  management of their disease process during treatment Date Initiated: 10/31/2021 Date Inactivated: 02/14/2022 Target Resolution Date: 11/15/2021 Goal Status: Met Patient will tolerate the hyperbaric oxygen therapy treatment Date Initiated: 10/31/2021 Target Resolution Date: 12/06/2021 Goal Status: Active Patient will tolerate the internal climate of the chamber Date Initiated: 10/31/2021 Date Inactivated: 02/14/2022 Target Resolution Date: 11/29/2021 Goal Status: Met Patient/caregiver will verbalize understanding of HBO goals, rationale, procedures and potential hazards Date Initiated: 10/31/2021 Date Inactivated: 02/14/2022 Target Resolution Date: 11/19/2021 Goal Status: Met Signs and symptoms of pulmonary oxygen toxicity will be recognized and promptly addressed Date Initiated: 10/31/2021 Date Inactivated: 02/14/2022 Target Resolution Date: 12/13/2021 Goal Status: Met Signs and symptoms of seizure will be recognized and promptly addressed ; seizing patients will suffer no harm Date Initiated: 10/31/2021 Target Resolution Date: 12/20/2021 Goal Status: Active Interventions: Administer a five (5) minute air break for patient if signs and symptoms of seizure appear and notify the hyperbaric physician Administer a ten (10) minute air break for patient if signs and symptoms of seizure appear and notify the hyperbaric physician Administer the correct therapeutic gas delivery based on the patients needs and limitations, per physician order Assess and provide for patientos comfort related to the hyperbaric environment and equalization of middle ear Assess for signs and symptoms related to adverse events, including but not limited to confinement anxiety, pneumothorax, oxygen toxicity and baurotrauma Assess patient for any history of confinement anxiety Assess patient's knowledge and expectations regarding hyperbaric medicine and provide education related to the hyperbaric environment, goals of treatment  and prevention of adverse events Notes: Necrotic Tissue Nursing Diagnoses: Impaired tissue integrity related to necrotic/devitalized tissue [name], [name]. ([id number]) Knowledge deficit related to management of necrotic/devitalized tissue Goals: Necrotic/devitalized tissue will be minimized in the wound bed Date Initiated: 11/28/2021 Target Resolution Date: 11/28/2021 Goal Status: Active Patient/caregiver will verbalize understanding of reason and process for debridement of necrotic tissue Date Initiated: 11/28/2021 Date Inactivated: 02/14/2022 Target Resolution Date: 11/28/2021 Goal Status: Met Interventions: Assess patient pain level pre-, during and post procedure and prior to discharge Provide education on necrotic tissue and debridement process Treatment Activities: Apply topical anesthetic as ordered : 11/28/2021 Excisional debridement : 11/28/2021 Notes: Wound/Skin Impairment Nursing Diagnoses: Impaired tissue integrity Knowledge deficit related to smoking impact on wound healing Knowledge deficit related to ulceration/compromised skin integrity Goals: Ulcer/skin breakdown will have a volume reduction of 30% by week 4 Date Initiated: 11/28/2021 Target Resolution Date: 12/12/2021 Goal Status: Active Interventions: Assess patient/caregiver ability to obtain necessary supplies Assess patient/caregiver ability to perform ulcer/skin care regimen upon admission and as needed Assess ulceration(s) every visit Treatment Activities: Topical wound management initiated : 11/28/2021 Notes: Electronic Signature(s) Signed: 03/10/2022 4:02:04 PM By: [name] RN Entered By: Dolores Lory  Carrie on 03/07/2022 12:59:11 [name], [name]. ([id number]) -------------------------------------------------------------------------------- Pain Assessment Details Patient Name: Mizrachi, Lannie [name]. Date of Service: 03/07/2022 1:15 PM Medical Record Number: [id number] Patient Account Number: [id number] Date of Birth/Sex:  1966/06/29 (56 y.o. M) Treating RN: [name] Primary Care Rieley Hausman: [name] Other Clinician: Referring Bettylou Frew: [name] Treating Laasya Peyton/Extender: [name] in Treatment: 18 Active Problems Location of Pain Severity and Description of Pain Patient Has Paino No Site Locations Pain Management and Medication Current Pain Management: Electronic Signature(s) Signed: 03/10/2022 4:02:04 PM By: [name] RN Entered By: [name] on 03/07/2022 12:50:18 [name], [name] ([id number]) -------------------------------------------------------------------------------- Patient/Caregiver Education Details Patient Name: Hardin, Semaje [name]. Date of Service: 03/07/2022 1:15 PM Medical Record Number: [id number] Patient Account Number: [id number] Date of Birth/Gender: 21-Jan-1966 (56 y.o. M) Treating RN: [name] Primary Care Physician: [name] Other Clinician: Referring Physician: [name] Treating Physician/Extender: [name] in Treatment: 18 Education Assessment Education Provided To: Patient Education Topics Provided Wound Debridement: Methods: Explain/Verbal Responses: State content correctly Electronic Signature(s) Signed: 03/10/2022 4:02:04 PM By: [name] RN Entered By: [name] on 03/07/2022 13:45:49 [name], [name]. ([id number]) -------------------------------------------------------------------------------- Wound Assessment Details Patient Name: [name], [name]. Date of Service: 03/07/2022 1:15 PM Medical Record Number: [id number] Patient Account Number: [id number] Date of Birth/Sex: 1966/06/26 (56 y.o. M) Treating RN: [name] Primary Care Nevaeh Casillas: [name] Other Clinician: Referring Tessica Cupo: [name] Treating Bahja Bence/Extender: [name] in Treatment: 18 Wound Status Wound Number: 1 Primary Open Surgical Wound Etiology: Wound Location: Left Amputation Site - Digit Wound Open Wounding Event: Surgical  Injury Status: Date Acquired: 09/09/2021 Comorbid Cataracts, Anemia, Sleep Apnea, Arrhythmia, Congestive Weeks Of Treatment: 15 History: Heart Failure, Coronary Artery Disease, Hypertension, Clustered Wound: No Myocardial Infarction, Type II Diabetes, Neuropathy, Seizure Disorder Photos Wound Measurements Length: (cm) 3 Width: (cm) 1 Depth: (cm) 0.7 Area: (cm) 2.356 Volume: (cm) 1.649 % Reduction in Area: 83.3% % Reduction in Volume: 96% Epithelialization: None Tunneling: No Undermining: No Wound Description Classification: Full Thickness With Exposed Support Structures Exudate Amount: Medium Exudate Type: Serous Exudate Color: amber Foul Odor After Cleansing: No Slough/Fibrino No Wound Bed Granulation Amount: Medium (34-66%) Exposed Structure Granulation Quality: Red, Pink Fascia Exposed: No Necrotic Amount: Medium (34-66%) Fat Layer (Subcutaneous Tissue) Exposed: Yes Necrotic Quality: Adherent Slough Tendon Exposed: No Muscle Exposed: No Joint Exposed: No Bone Exposed: No Treatment Notes Wound #1 (Amputation Site - Digit) Wound Laterality: Left Cleanser Wound Cleanser Discharge Instruction: Wash your hands with soap and water. Remove old dressing, discard into plastic bag and place into trash. Cleanse the wound with Wound Cleanser prior to applying a clean dressing using gauze sponges, not tissues or cotton balls. Do not [place], [place] ([id number]) scrub or use excessive force. Pat dry using gauze sponges, not tissue or cotton balls. Peri-Wound Care Topical Primary Dressing Prisma 4.34 (in) Discharge Instruction: Moisten w/normal saline or sterile water; Cover wound as directed. Do not remove from wound bed. Secondary Dressing Secured With Medipore Tape - 42M Medipore H Soft Cloth Surgical Tape, 2x2 (in/yd) Kerlix Roll Sterile or Non-Sterile 6-ply 4.5x4 (yd/yd) Discharge Instruction: Apply Kerlix as directed ABD pad Compression Wrap Compression  Stockings Add-Ons Electronic Signature(s) Signed: 03/10/2022 4:02:04 PM By: [name] RN Entered By: [name] on 03/07/2022 12:58:35 [name], [name]. ([id number]) -------------------------------------------------------------------------------- Wound Assessment Details Patient Name: Ehrler, Epifanio [name]. Date of Service: 03/07/2022 1:15 PM Medical Record Number: [id number] Patient Account Number: [id number] Date of Birth/Sex: May 20, 1966 (56 y.o. M) Treating RN: Epps,  Morey Hummingbird Primary Care Shamiracle Gorden: [name] Other Clinician: Referring Jasemine Nawaz: [name] Treating Burnis Halling/Extender: [name] in Treatment: 18 Wound Status Wound Number: 2 Primary Diabetic Wound/Ulcer of the Lower Extremity Etiology: Wound Location: Left, Lateral Foot Wound Open Wounding Event: Blister Status: Date Acquired: 10/28/2021 Comorbid Cataracts, Anemia, Sleep Apnea, Arrhythmia, Congestive Weeks Of Treatment: 14 History: Heart Failure, Coronary Artery Disease, Hypertension, Clustered Wound: No Myocardial Infarction, Type II Diabetes, Neuropathy, Seizure Disorder Photos Wound Measurements Length: (cm) 4 Width: (cm) 2.2 Depth: (cm) 0.7 Area: (cm) 6.912 Volume: (cm) 4.838 % Reduction in Area: -120% % Reduction in Volume: -1440.8% Epithelialization: None Tunneling: No Undermining: No Wound Description Classification: Grade 2 Exudate Amount: Medium Exudate Type: Serosanguineous Exudate Color: red, brown Foul Odor After Cleansing: No Slough/Fibrino Yes Wound Bed Granulation Amount: Small (1-33%) Exposed Structure Granulation Quality: Red Fascia Exposed: No Necrotic Amount: Large (67-100%) Fat Layer (Subcutaneous Tissue) Exposed: Yes Necrotic Quality: Eschar, Adherent Slough Tendon Exposed: Yes Muscle Exposed: No Joint Exposed: Yes Bone Exposed: No Treatment Notes Wound #2 (Foot) Wound Laterality: Left, Lateral Cleanser Wound Cleanser Discharge Instruction: Wash your hands with  soap and water. Remove old dressing, discard into plastic bag and place into trash. Cleanse the wound with Wound Cleanser prior to applying a clean dressing using gauze sponges, not tissues or cotton balls. Do not [place], [place] ([id number]) scrub or use excessive force. Pat dry using gauze sponges, not tissue or cotton balls. Peri-Wound Care Topical Primary Dressing Secondary Dressing ABD Pad 5x9 (in/in) Discharge Instruction: Cover with ABD pad Kerlix 4.5 x 4.1 (in/yd) Discharge Instruction: Apply Kerlix 4.5 x 4.1 (in/yd) as instructed Secured With [employer] H Soft Cloth Surgical Tape, 2x2 (in/yd) ABD pad Compression Wrap Compression Stockings Add-Ons Electronic Signature(s) Signed: 03/10/2022 4:02:04 PM By: [name] RN Entered By: [name] on 03/07/2022 12:58:53 Bruno, Jereme [name]. ([id number]) -------------------------------------------------------------------------------- Vitals Details Patient Name: Brodbeck, Fredric [name]. Date of Service: 03/07/2022 1:15 PM Medical Record Number: [id number] Patient Account Number: [id number] Date of Birth/Sex: 05-11-1966 (55 y.o. M) Treating RN: [name] Primary Care Jackquline Branca: [name] Other Clinician: Referring Chaunda Vandergriff: [name] Treating Klayton Monie/Extender: [name] in Treatment: 18 Vital Signs Time Taken: 12:49 Temperature (F): 98.1 Height (in): 70 Pulse (bpm): 66 Weight (lbs): 180.4 Respiratory Rate (breaths/min): 16 Body Mass Index (BMI): 25.9 Blood Pressure (mmHg): 132/68 Reference Range: 80 - 120 mg / dl Electronic Signature(s) Signed: 03/10/2022 4:02:04 PM By: [name] RN Entered By: [name] on 03/07/2022 12:50:05

## 2022-03-08 NOTE — Progress Notes (Signed)
Burzynski, [place] ([id number]) Visit Report for 03/06/2022 HBO Details Patient Name: [name], [name] Date of Service: 03/06/2022 8:00 AM Medical Record Number: [id number] Patient Account Number: [id number] Date of Birth/Sex: 1966/07/27 (56 y.o. 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 HBO Treatment Details Treatment Number: 81 Patient Type: Outpatient Chamber Type: Monoplace Chamber Serial #: [id number] Treatment Protocol: 2.0 ATA with 90 minutes oxygen, and no air breaks Treatment Details Compression Rate Down: 1.0 psi / minute De-Compression Rate Up: 1.5 psi / minute Compress Tx Pressure Air breaks and breathing periods Decompress Decompress Begins Reached (leave unused spaces blank) Begins Ends Chamber Pressure (ATA) 1 2 - - - - - - 2 1 Clock Time (24 hr) 08:17 08:34 - - - - - - 10:04 10:14 Treatment Length: 117 (minutes) Treatment Segments: 4 Vital Signs Capillary Blood Glucose Reference Range: 80 - 120 mg / dl HBO Diabetic Blood Glucose Intervention Range: <131 mg/dl or >249 mg/dl Time Vitals Blood Respiratory Capillary Blood Glucose Pulse Action Type: Pulse: Temperature: Taken: Pressure: Rate: Glucose (mg/dl): Meter #: Oximetry (%) Taken: Pre 08:00 128/68 78 16 98.2 223 1 none per protocol Post 10:23 126/72 60 16 98.4 136 1 none per protocol Treatment Response Treatment Toleration: Well Treatment Completion Treatment Completed without Adverse Event Status: HBO Attestation I certify that I supervised this HBO treatment in accordance with Medicare guidelines. A trained emergency response team is readily  Yes available per hospital policies and procedures. Continue HBOT as ordered. Yes Electronic Signature(s) Signed: 03/06/2022 5:47:35 PM By: [name] Previous Signature: 03/06/2022 3:48:59 PM Version By: [name] [name] Entered By: [name] on 03/06/2022 17:47:34 [name], [name] ([id number]) -------------------------------------------------------------------------------- HBO Safety Checklist Details Patient Name: [name], [name] Date of Service: 03/06/2022 8:00 AM Medical Record Number: [id number] Patient Account Number: [id number] Date of Birth/Sex: 22-Jan-1966 (56 y.o. M) Treating RN: [name] Primary Care Jquan Egelston: [name] Other Clinician: Jacqulyn [name] Referring [name]: [name] Treating [name]/Extender: [name] in Treatment: 18 HBO Safety Checklist Items Safety Checklist Consent Form Signed Patient voided / foley secured and emptied When did you last eato 07:00 am Last dose of injectable or oral agent n/a Ostomy pouch emptied and vented if applicable NA All implantable devices assessed, documented and approved NA Intravenous access site secured and place NA Valuables secured Linens and cotton and cotton/polyester blend (less than 51% polyester) Personal oil-based products / skin lotions / body lotions removed Wigs or hairpieces removed NA Smoking or tobacco materials removed NA Books / newspapers / magazines / loose paper removed NA Cologne, aftershave, perfume and deodorant removed Jewelry removed (may wrap wedding band) Make-up removed NA Hair care products removed Battery operated devices (external) removed NA Heating patches and chemical warmers removed NA Titanium eyewear removed NA Nail polish cured greater than 10 hours NA Casting material cured greater than 10 hours NA Hearing aids removed NA Loose dentures or partials removed NA Prosthetics have been removed NA Patient  demonstrates correct use of air break device (if applicable) Patient concerns have been addressed Patient grounding bracelet on and cord attached to chamber Specifics for Inpatients (complete in addition to above) Medication sheet sent with patient Intravenous medications needed or  due during therapy sent with patient Drainage tubes (e.g. nasogastric tube or chest tube secured and vented) Endotracheal or Tracheotomy tube secured Cuff deflated of air and inflated with saline Airway suctioned Electronic Signature(s) Signed: 03/06/2022 3:48:59 PM By: [name] [name] Entered By: [name], [name] on 03/06/2022 08:48:09

## 2022-03-08 NOTE — Progress Notes (Addendum)
[name] [name], [name] [name] ([id number]) Visit Report for 03/07/2022 Chief Complaint Document Details Patient Name: [name] [name], [name] [name]. Date of Service: 03/07/2022 1:15 PM Medical Record Number: [id number] Patient Account Number: [id number] Date of Birth/Sex: [birth year] (56 y.o. M) Treating RN: Sharyn Creamer Primary Care Provider: [name] Other Clinician: Referring Provider: [name] Treating Provider/Extender: [name] in Treatment: 18 Information Obtained from: Patient Chief Complaint HBO evaluation for treatment on the left foot diabetic ulcer with osteomyelitis. Patient is currently under our care as well for his foot. Electronic Signature(s) Signed: 03/07/2022 11:50:43 AM By: Worthy Keeler PA-C Entered By: Worthy Keeler on 03/07/2022 11:50:43 [name], [name] ([id number]) -------------------------------------------------------------------------------- Cellular or Tissue Based Product Details Patient Name: [name], [name] Date of Service: 03/07/2022 1:15 PM Medical Record Number: [id number] Patient Account Number: [id number] Date of Birth/Sex: Jun 06, 1966 (56 y.o. M) Treating RN: [name] Primary Care Provider: [name] Other Clinician: Referring Provider: [name] Treating Provider/Extender: [name] in Treatment: 18 Cellular or Tissue Based Product Type Wound #2 Left,Lateral Foot Applied to: Performed By: Physician Tommie Sams., PA-C Cellular or Tissue Based Product Epicord Type: Level of Consciousness (Pre- Awake and Alert procedure): Pre-procedure Verification/Time Out Yes - 13:10 Taken: Location: genitalia / hands / feet / multiple digits Wound Size (sq cm): 8.8 Product Size (sq cm): 6 Waste Size (sq cm): 0 Amount of Product Applied (sq cm): 6 Instrument Used: Curette Lot #: [id number] Order #: 2 Expiration Date: 12/19/2026 Fenestrated: Yes Instrument: Blade Reconstituted: Yes Solution Type: normal saline Solution Amount: 10  ml Lot #: [id number] Solution Expiration Date: 10/21/2023 Secured: Yes Secured With: Steri-Strips Dressing Applied: Yes Primary Dressing: adaptic Procedural Pain: 0 Post Procedural Pain: 0 Response to Treatment: Procedure was tolerated well Level of Consciousness (Post- Awake and Alert procedure): Post Procedure Diagnosis Same as Pre-procedure Electronic Signature(s) Signed: 03/07/2022 2:00:16 PM By: [name] RN Previous Signature: 03/07/2022 1:45:23 PM Version By: [name] RN Entered By: [name] on 03/07/2022 14:00:16 [name], [name] -------------------------------------------------------------------------------- Debridement Details Patient Name: [name], [name] Date of Service: 03/07/2022 1:15 PM Medical Record Number: [id number] Patient Account Number: [id number] Date of Birth/Sex: December 25, 1965 (56 y.o. M) Treating RN: [name] Primary Care Provider: [name] Other Clinician: Referring Provider: [name] Treating Provider/Extender: [name] in Treatment: 18 Debridement Performed for Wound #2 Left,Lateral Foot Assessment: Performed By: Physician Tommie Sams., PA-C Debridement Type: Debridement Severity of Tissue Pre Debridement: Fat layer exposed Level of Consciousness (Pre- Awake and Alert procedure): Pre-procedure Verification/Time Out Yes - 13:10 Taken: Start Time: 13:10 Total Area Debrided (L x W): 4 (cm) x 2.2 (cm) = 8.8 (cm) Tissue and other material Viable, Non-Viable, Slough, Subcutaneous, Slough debrided: Level: Skin/Subcutaneous Tissue Debridement Description: Excisional Instrument: Curette Bleeding: Minimum Hemostasis Achieved: Pressure End Time: 13:15 Procedural Pain: 0 Post Procedural Pain: 0 Response to Treatment: Procedure was tolerated well Level of Consciousness (Post- Awake and Alert procedure): Post Debridement Measurements of Total Wound Length: (cm) 4 Width: (cm) 2.2 Depth: (cm) 0.7 Volume:  (cm) 4.838 Character of Wound/Ulcer Post Debridement: Improved Severity of Tissue Post Debridement: Fat layer exposed Post Procedure Diagnosis Same as Pre-procedure Electronic Signature(s) Signed: 03/07/2022 1:47:08 PM By: Worthy Keeler PA-C Signed: 03/10/2022 4:02:04 PM By: [name] RN Entered By: [name] on 03/07/2022 13:12:22 [name], [name] -------------------------------------------------------------------------------- HPI Details Patient Name: [name], [name] Date of Service: 03/07/2022 1:15 PM Medical Record Number: [id number] Patient Account Number: [id number] Date of Birth/Sex: 04/10/1966 (  56 y.o. M) Treating RN: Sharyn Creamer Primary Care Provider: [name] Other Clinician: Referring Provider: [name] Treating Provider/Extender: [name] in Treatment: 18 History of Present Illness HPI Description: 10/31/2021 patient presents today for evaluation here in our clinic. He is actually being evaluated for hyperbaric oxygen therapy only. He has been seen at [facility] up to this point he is under the care of the vascular surgery specialty. Subsequently he is also under the care of the hyperbaric center there. With that being said that due to the fact that he is a dialysis patient he was actually recommended to be transferred to [place] for his treatments he actually lives in [place] which is where his dialysis is. It was a hardship for him to try to get from [place] especially on dialysis days all the way to [facility] get into the facility for his treatment and get back home he was literally spending all day long. He has had just 4 treatments there at [facility] thus far based on what I see in these have not been continued while he awaits approval here in our clinic. Subsequently I do have records for review that will be included in the HPI predetermination review and attached to this chart as well. I will not duplicate that here.  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 Nonetheless he continues to have exposed bone with marked soft tissue swelling compatible with osteomyelitis. This is due to the irregularity of the head of the metatarsal as well. Nonetheless along with having associated cellulitis he is also good to be seen by infectious disease and is currently on antibiotics for this as well. Again that will be detailed in the pretreatment review section which I will attach to this note as well. He does currently have a wound VAC in place and again the wound was not evaluated here in the clinic as that is being completely managed by home health and [facility]. The patient does have a history of diabetes mellitus type 2, hypertension, coronary artery disease, and congestive heart failure. He also has cataracts of both eyes but no evidence of glaucoma on his most recent eye exam. 11/22/2021 we have been seeing this gentleman for hyperbaric oxygen therapy but have not actually evaluated his wound up to this point this was being managed by the wound care and vascular team I presumed at [place] at least that is what has been told to be previous. Nonetheless at this point I got a call from Atlee Abide who is a Librarian, academic at the [facility] vascular clinic with Dr. Durene Fruits and subsequently they were wanting to know if I could take over wound care for this patient. Apparently they do not have the ability for an office debridement which again I completely understand but nonetheless definitely is not an integral part of his healing along with the hyperbarics. Subsequently I discussed with the patient today that I do believe he would benefit going ahead and proceeding with evaluation here in the clinic for that reason I did go ahead and see him today to get things started. There  is also been some confusion about the issues with his home health nurse and getting measurements to [facility] for the wound VAC in order to continue his wound VAC therapy. With that being said after I see him today  we will make a determination on what to do with wound VAC we can definitely send measurements to [facility] but again the bigger question is whether this is the best way to go or not. 11/28/2021 upon evaluation today patient appears to be doing decently well in regard to his wound. He has been tolerating the dressing changes with the Dakin's moistened gauze dressing which I think is doing a good job. Fortunately I do not see any signs of active infection locally nor systemically at this point. In general I think that he is actually making excellent progress which is great news. 2/17; seen in conjunction with HBO today. 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 Much improved condition of the wound surface. On the lateral foot and eschared area that he has been using Betadine. Tolerating HBO well 12/13/2021 upon evaluation today patient appears to be doing pretty well in regard to his foot ulcer. I am actually much more pleased currently that I been with the way the appearance looks today. The wound bed does show some signs of slough and biofilm on the surface of the wound this is minimal I Minna perform debridement the I will do so very carefully due to the fact that to be honest the patient had a significant amount of bleeding in the past being on the Brilinta he had a difficult time clotting when I debrided him previously quite significantly. Fortunately I do not see any signs of active infection at this time which is great news. No fevers, chills, nausea, vomiting, or diarrhea. 12/20/2021 upon evaluation today patient actually appears to be showing some signs of excellent improvement here. I am very pleased with where things stand currently. There does not appear to be any  evidence of active infection locally nor systemically which is great news and I do believe that with the hyperbaric oxygen therapy he is actually making excellent progress. 3/9; patient presents for follow-up. He continues to do HBO therapy without issues. He has completed 34 out of 40 sessions. He continues to use Dakin's wet-to-dry to the amputation site and Betadine to the lateral foot wound. he currently denies signs of infection. 01/03/2022 upon evaluation today patient appears to be doing better in regard to his distal foot ulcer. Unfortunately the lateral foot is a different story this area has softened up and is getting need to be debrided away today. I Georgina Peer do this with scissors and forceps and was able to clear that out that will be detailed below. Otherwise I feel like the Dakin's moistened gauze dressing is doing an awesome job for him and he is making significantly improvements here. 01/10/2022 upon evaluation today patient continues to have some of the necrotic tissue loosen up on the lateral portion of his foot the main wound that we have been treating is actually doing quite well. Overall very pleased with where things stand in general. No fevers, chills, nausea, vomiting, or diarrhea. 01/17/2022 upon evaluation today patient appears to be doing somewhat poorly in regard to the lateral foot wound. The main wound that we initially were seeing him for looks to be doing great. This lateral foot has worsened and unfortunately it appears to potentially have some signs of infection here. I do believe that we may need to see about getting started on some IV antibiotics he is in dialysis so they could potentially do this for him, Monday. Nonetheless I want to try to get that started ASAP. Again  I would prefer him be initiated with IV vancomycin and cefepime just as an empiric broad-spectrum until we can get the culture results back at least. Subsequently also think that this should be done  sooner rather than later due to the fact that his leg is more swollen and overall this seems to have progressed fairly rapidly since last week I do not want this to get worse. He voiced understanding. 01-24-2022 upon evaluation today patient appears to be doing well with regard to his main ulcer that we have been seeing him for. I am very pleased with where things stand currently. He still continues to have bleeding if we do any type of aggressive sharp debridement there for him Gavel, [name] ([id number]) having to be very careful using Dakin's solution along with debridement as I can here in the office. Overall the patient seems to be doing well. Unfortunately the lateral foot wound still has progressed a little bit further. We did get him on antibiotics starting this past Monday I think the drainage looks much better and overall I am seeing improvement but again this was already broken down to a certain degree I am hopeful by next week this will really start turnaround for [place]. We will also get a send him for an x-ray to evaluate for any signs of osteomyelitis on this lateral foot. 01-31-2022 upon evaluation today patient's wound on the medial left amputation site is actually doing excellent. I am very pleased with where things stand this is significantly smaller and we are headed in the right direction. In regard to the left lateral foot there is still some necrotic tissue to be cleared away although I see evidence of this starting to basically solidify as far as the edges are concerned with how far it spread I do not see anything seeming to want to spread further. I believe this may be close to done as far as any evolving is concerned. 02-07-2022 upon evaluation today patient appears to be doing well with regard to the main amputation site. Unfortunately the secondary site on the left lateral foot is still giving him some trouble here. There is a lot of dried tissue and I do believe that along with  the Dakin's we may want to use a contact layer in the base of the wound to try to prevent the bone from drying out because we are down to that point at this time. The patient voiced understanding at this point as well and is happy to do what ever it takes to try to get this better. With that being said I do believe that it is possible he may benefit from a wound VAC. Again the question is the approval process for getting this done obviously we can see what we can do if we get things cleaned a little bit better but right now I think the best option is going to be for [place] to continue with the packing currently. 02-14-2022 upon evaluation today patient presents for follow-up concerning ongoing issues with his foot. He did have the MRI which does show that he has evidence of osteomyelitis of the digit at the fifth location of his foot. Subsequently this does extend down to the base of the digit and into the proximal portion of the metatarsal as well. With that being said the bone does not appear to be too severely broken down and a lot of the marrow signal is stated to be maintained which is good news. Nonetheless I do believe  that he is unfortunately still in a spot where this is precarious and we need to make sure that we are treating this appropriately. He does seem to be doing well with IV antibiotics he also seems to be doing quite well when it comes to the overall appearance of the wound bed at this point. I am happy in that regard. 02-21-2022 upon evaluation today patient appears to be doing well all things considered in regard to his wounds. I do feel like that he is making some progress here. The wound on the right distal foot where the amputation site is is actually healing nicely I feel like we can switch over to collagen at this point that should do well for him. In regard to the left lateral foot this is actually getting much cleaner he still has some area where there seems to be some pressure  getting to the location I think that he would do well with a knee scooter at this point. I discussed that with him he does not seem prone to going that route at this point he understands it can definitely help. 02-28-2022 upon evaluation today patient appears to be doing well currently with regard to the wound on his foot. The main surgical wound is actually significantly improved and the lateral wound is doing better there is still bone exposed but nonetheless this does not appear to be significantly worse than what we have noted previous. In fact I think it looks better than it did last week. My goal is to keep the bone from drying out and subsequently for that reason we are going to go ahead and proceed with the EpiFix which I think is good to be a good option here for him. Over top of the EpiFix we will do a contact layer in order to hopefully keep things from drying out. This dressing will actually stay in place until he comes back. Patient did see vascular yesterday and states that they did not feel like anything was doing worse still it appears that if it were left up to them they think the ideal thing would be to proceed with an amputation. I explained to the patient that to be honest is not always avoidable but if we can try to avoid amputation that is what we are after. He voiced understanding and he does want to do what ever he can to try to prevent amputation for that reason we are proceeding with the epi cord today. This will be application #1. [id number] upon evaluation today patient appears to be making some progress here regard to the lateral foot. Fortunately there does not appear to be any signs of active infection locally or systemically which is great news. No fevers, chills, nausea, vomiting, or diarrhea. I do think that he is appropriate for second application of EpiCord today. Electronic Signature(s) Signed: 03/07/2022 1:43:39 PM By: Worthy Keeler PA-C Entered By: Worthy Keeler on 03/07/2022 13:43:39 [name], [name] ([id number]) -------------------------------------------------------------------------------- Physical Exam Details Patient Name: [name], [name] Date of Service: 03/07/2022 1:15 PM Medical Record Number: [id number] Patient Account Number: [id number] Date of Birth/Sex: [birth year] (56 y.o. M) Treating RN: Sharyn Creamer Primary Care Provider: [name] Other Clinician: Referring Provider: [name] Treating Provider/Extender: [name] in Treatment: 61 Constitutional Well-nourished and well-hydrated in no acute distress. Respiratory normal breathing without difficulty. Psychiatric this patient is able to make decisions and demonstrates good insight into disease process. Alert and Oriented x 3. pleasant and cooperative. Notes  Upon inspection patient's wound on the medial aspect is actually doing excellent I am very pleased with what we see in there. On the lateral aspect he did have some callus around the edge also some sharp debridement was needed to clear away some of the surface of the wound. I performed this today without complication postdebridement wound bed appears to be doing much better which is great news. Overall I think we are on the right track here. Electronic Signature(s) Signed: 03/07/2022 1:44:02 PM By: Worthy Keeler PA-C Entered By: Worthy Keeler on 03/07/2022 13:44:02 [name], [name] ([id number]) -------------------------------------------------------------------------------- Physician Orders Details Patient Name: [name], [name] Date of Service: 03/07/2022 1:15 PM Medical Record Number: [id number] Patient Account Number: [id number] Date of Birth/Sex: 15-Apr-1966 (56 y.o. M) Treating RN: [name] Primary Care Provider: [name] Other Clinician: Referring Provider: [name] Treating Provider/Extender: [name] in Treatment: 33 Verbal / Phone Orders: No Diagnosis Coding ICD-10  Coding Code Description E11.621 Type 2 diabetes mellitus with foot ulcer 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e M86.672 Other chronic osteomyelitis, left ankle and foot I10 Essential (primary) hypertension I25.10 Atherosclerotic heart disease of native coronary artery without angina pectoris I50.42 Chronic combined systolic (congestive) and diastolic (congestive) heart failure R26.89 Other abnormalities of gait and mobility Follow-up Appointments o Return Appointment in 1 week. - provider visits at wound center o Return Appointment in: - HBO- (daily Monday -Friday for HBO Treatments) [place] for wound care. May utilize formulary equivalent dressing for wound treatment orders unless otherwise specified. Home Health Nurse may visit PRN to address patientos wound care needs. o Scheduled days for dressing changes to be completed; exception, patient has scheduled wound care visit that day. o **Please direct any NON-WOUND related issues/requests for orders to patient's Primary Care Physician. **If current dressing causes regression in wound condition, may D/C ordered dressing product/s and apply Normal Saline Moist Dressing daily until next [place] or Other MD appointment. **Notify Wound Healing Center of regression in wound condition at [phone]. Bathing/ Shower/ Hygiene o Clean wound with Normal Saline or wound cleanser. o May shower with wound dressing protected with water repellent cover or cast protector. o No tub bath. Cellular or Tissue Based Products Wound #2 Left,Lateral Foot o Cellular or Tissue Based Product Type: - epifix #2 o Cellular or Tissue Based Product applied to wound bed; including contact layer, fixation with steri-strips, dry gauze and cover dressing. (DO NOT REMOVE). Edema Control - Lymphedema / Segmental Compressive Device /  Other o Elevate, Exercise Daily and Avoid Standing for Long Periods of Time. o Elevate legs to the level of the heart and pump ankles as often as possible o Elevate leg(s) parallel to the floor when sitting. Off-Loading Left Lower Extremity o Foot Defender o - size large Additional Orders / Instructions o Follow Nutritious Diet and Increase Protein Intake o Other: - Follow vascular, nephrology and PCP for wound care and other needs Dialysis appt as scheduled M-W-F Hyperbaric Oxygen Therapy o Indication and location: - Left 1st and 2nd Ray Amputation osteomyelitis and left lateral foot osteomyelitis. I added treatments onto the original regimen to account for the lateral foot osteomyelitis. o If appropriate for treatment, begin HBOT per protocol: o 2.0 ATA for 90 Minutes without Air Breaks o One treatment per day (delivered Monday through Friday unless otherwise specified in Special Instructions below): [name], [name] Ball  L. ([id number]) o Total # of Treatments: - 100 o Finger stick Blood Glucose Pre- and Post- HBOT Treatment. o Follow Hyperbaric Oxygen Glycemia Protocol Wound Treatment Wound #1 - Amputation Site - Digit Wound Laterality: Left Cleanser: Wound Cleanser 3 x Per Week/30 Days Discharge Instructions: Wash your hands with soap and water. Remove old dressing, discard into plastic bag and place into trash. Cleanse the wound with Wound Cleanser prior to applying a clean dressing using gauze sponges, not tissues or cotton balls. Do not scrub or use excessive force. Pat dry using gauze sponges, not tissue or cotton balls. Primary Dressing: Prisma 4.34 (in) 3 x Per Week/30 Days Discharge Instructions: Moisten w/normal saline or sterile water; Cover wound as directed. Do not remove from wound bed. Secured With: Medipore Tape - 81M Medipore H Soft Cloth Surgical Tape, 2x2 (in/yd) 3 x Per Week/30 Days Secured With: [employer] Sterile or Non-Sterile 6-ply 4.5x4  (yd/yd) 3 x Per Week/30 Days Discharge Instructions: Apply Kerlix as directed Secured With: ABD pad 3 x Per Week/30 Days Wound #2 - Foot Wound Laterality: Left, Lateral Cleanser: Wound Cleanser 3 x Per Week/30 Days Discharge Instructions: Wash your hands with soap and water. Remove old dressing, discard into plastic bag and place into trash. Cleanse the wound with Wound Cleanser prior to applying a clean dressing using gauze sponges, not tissues or cotton balls. Do not scrub or use excessive force. Pat dry using gauze sponges, not tissue or cotton balls. Secondary Dressing: ABD Pad 5x9 (in/in) 3 x Per Week/30 Days Discharge Instructions: Cover with ABD pad Secondary Dressing: Kerlix 4.5 x 4.1 (in/yd) 3 x Per Week/30 Days Discharge Instructions: Apply Kerlix 4.5 x 4.1 (in/yd) as instructed Secured With: Medipore Tape - 81M Medipore H Soft Cloth Surgical Tape, 2x2 (in/yd) 3 x Per Week/30 Days Secured With: ABD pad 3 x Per Week/30 Days GLYCEMIA INTERVENTIONS PROTOCOL PRE-HBO GLYCEMIA INTERVENTIONS ACTION INTERVENTION Obtain pre-HBO capillary blood glucose (ensure 1 physician order is in chart). A. Notify HBO physician and await physician orders. 2 If result is 70 mg/dl or below: B. If the result meets the hospital definition of a critical result, follow hospital policy. A. Give patient an 8 ounce Glucerna Shake, an 8 ounce Ensure, or 8 ounces of a Glucerna/Ensure equivalent dietary supplement*. B. Wait 30 minutes. If result is 71 mg/dl to 130 mg/dl: C. Retest patientos capillary blood glucose (CBG). D. If result greater than or equal to 110 mg/dl, proceed with HBO. If result less than 110 mg/dl, notify HBO physician and consider holding HBO. If result is 131 mg/dl to 249 mg/dl: A. Proceed with HBO. A. Notify HBO physician and await physician orders. B. It is recommended to hold HBO and do If result is 250 mg/dl or greater: blood/urine ketone testing. C. If the result meets  the hospital definition of a critical result, follow hospital policy. POST-HBO GLYCEMIA INTERVENTIONS ACTION INTERVENTION Obtain post HBO capillary blood glucose (ensure 1 physician order is in chart). 2 If result is 70 mg/dl or below: A. Notify HBO physician and await physician orders. [place], [place] ([id number]) B. If the result meets the hospital definition of a critical result, follow hospital policy. A. Give patient an 8 ounce Glucerna Shake, an 8 ounce Ensure, or 8 ounces of a Glucerna/Ensure equivalent dietary supplement*. B. Wait 15 minutes for symptoms of hypoglycemia (i.e. nervousness, anxiety, If result is 71 mg/dl to 100 mg/dl: sweating, chills, clamminess, irritability, confusion, tachycardia or dizziness). C. If patient asymptomatic, discharge patient. If  patient symptomatic, repeat capillary blood glucose (CBG) and notify HBO physician. If result is 101 mg/dl to 249 mg/dl: A. Discharge patient. A. Notify HBO physician and await physician orders. B. It is recommended to do blood/urine If result is 250 mg/dl or greater: ketone testing. C. If the result meets the hospital definition of a critical result, follow hospital policy. *Juice or candies are NOT equivalent products. If patient refuses the Glucerna or Ensure, please consult the hospital dietitian for an appropriate substitute. Electronic Signature(s) Signed: 03/07/2022 1:47:08 PM By: Worthy Keeler PA-C Signed: 03/10/2022 4:02:04 PM By: [name] RN Entered By: [name] on 03/07/2022 13:13:34 [place], [place] ([id number]) -------------------------------------------------------------------------------- Problem List Details Patient Name: [name], [name] Date of Service: 03/07/2022 1:15 PM Medical Record Number: [id number] Patient Account Number: [id number] Date of Birth/Sex: October 11, 1966 (56 y.o. 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/31/2021 No Yes bone M86.672 Other chronic osteomyelitis, left ankle and foot 10/31/2021 No Yes I10 Essential (primary) hypertension 10/31/2021 No Yes I25.10 Atherosclerotic heart disease of native coronary artery without angina 10/31/2021 No Yes pectoris I50.42 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 Date of Service: 03/07/2022 1:15 PM Medical Record Number: [id number] Patient Account Number: [id number] Date of Birth/Sex: 23-Jul-1966 (56 y.o. M) Treating RN: Sharyn Creamer Primary Care Provider: [name] Other Clinician: Referring Provider: [name] Treating Provider/Extender: [name] in Treatment: 18 Subjective Chief Complaint Information obtained from Patient HBO evaluation for treatment on the left foot diabetic ulcer with osteomyelitis. Patient is currently under our care as well for his foot. History of Present Illness (HPI) 10/31/2021 patient presents today for evaluation here in our clinic. He is actually being evaluated for hyperbaric oxygen therapy only. He has been seen at [facility] up to this point he is under the care of the vascular surgery specialty. Subsequently he is also under the care of  the hyperbaric center there. With that being said that due to the fact that he is a dialysis patient he was actually recommended to be transferred to [place] for his treatments he actually lives in [place] which is where his dialysis is. It was a hardship for him to try to get from [place] especially on dialysis days all the way to [facility] get into the facility for his treatment and get back home he was literally spending all day long. He has had just 4 treatments there at [facility] thus far based on what I see in these have not been continued while he awaits approval here in our clinic. Subsequently I do have records for review that will be included in the HPI predetermination review and attached to this chart as well. I will not duplicate that here. 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 Nonetheless he continues to have exposed bone with marked soft tissue swelling compatible with osteomyelitis. This is due to the irregularity of the head of the metatarsal as well. Nonetheless along with having associated cellulitis he is also good to be seen by infectious disease and is currently on antibiotics for this as well. Again that will be detailed in the pretreatment review section which I will attach to this note as well. He does currently have a wound VAC in place and again the wound was not evaluated here in the clinic as that is being completely managed by home health and [facility]. The patient does have a history of diabetes mellitus type 2, hypertension, coronary artery disease, and congestive heart failure. He also has cataracts of both eyes but no evidence of glaucoma on his most recent eye exam. 11/22/2021 we have been seeing this gentleman for hyperbaric oxygen therapy but have not actually evaluated his wound up to this point this  was being managed by the wound care and vascular team I presumed at [place] at least that is what has been told to be previous. Nonetheless at this point I got a call from Atlee Abide who is a Librarian, academic at the [facility] vascular clinic with Dr. Durene Fruits and subsequently they were wanting to know if I could take over wound care for this patient. Apparently they do not have the ability for an office debridement which again I completely understand but nonetheless definitely is not an integral part of his healing along with the hyperbarics. Subsequently I discussed with the patient today that I do believe he would benefit going ahead and proceeding with evaluation here in the clinic for that reason I did go ahead and see him today to get things started. There is also been some confusion about the issues with his home health nurse and getting measurements to [facility] for the wound VAC in order to continue his wound VAC therapy. With that being said after I see him today we will make a determination on what to do with wound VAC we can definitely send measurements to [facility] but again the bigger question is whether this is the best way to go or not. 11/28/2021 upon evaluation today patient appears to be doing decently well in regard to his wound. He has been tolerating the dressing changes with the Dakin's moistened gauze dressing which I think is doing a good job. Fortunately I do not see any signs of active infection locally nor systemically at this point. In general I think that he is actually making excellent progress which is great news. 2/17; seen in conjunction with HBO today. 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 Much improved condition of the wound surface. On the lateral foot and eschared area that he has been using Betadine. Tolerating HBO well 12/13/2021 upon evaluation today patient appears to be doing pretty well in regard to his foot ulcer. I am actually much more  pleased currently that I been with the way the appearance looks today. The wound bed does show some signs of slough and biofilm on the surface of the wound this is minimal I Minna perform debridement the I will do so very carefully due to the fact that to be honest the patient had a significant amount of bleeding in the past being on the [place]il[place] he had a difficult time clotting when I debrided him previously quite significantly. Fortunately I do not see any  signs of active infection at this time which is great news. No fevers, chills, nausea, vomiting, or diarrhea. 12/20/2021 upon evaluation today patient actually appears to be showing some signs of excellent improvement here. I am very pleased with where things stand currently. There does not appear to be any evidence of active infection locally nor systemically which is great news and I do believe that with the hyperbaric oxygen therapy he is actually making excellent progress. 3/9; patient presents for follow-up. He continues to do HBO therapy without issues. He has completed 34 out of 40 sessions. He continues to use Dakin's wet-to-dry to the amputation site and Betadine to the lateral foot wound. he currently denies signs of infection. 01/03/2022 upon evaluation today patient appears to be doing better in regard to his distal foot ulcer. Unfortunately the lateral foot is a different story this area has softened up and is getting need to be debrided away today. I Georgina Peer do this with scissors and forceps and was able to clear that out that will be detailed below. Otherwise I feel like the Dakin's moistened gauze dressing is doing an awesome job for him and he is making significantly improvements here. 01/10/2022 upon evaluation today patient continues to have some of the necrotic tissue loosen up on the lateral portion of his foot the main wound that we have been treating is actually doing quite well. Overall very pleased with where things stand in  general. No fevers, chills, nausea, vomiting, or diarrhea. 01/17/2022 upon evaluation today patient appears to be doing somewhat poorly in regard to the lateral foot wound. The main wound that we initially were seeing him for looks to be doing great. This lateral foot has worsened and unfortunately it appears to potentially have some signs of infection here. I do believe that we may need to see about getting started on some IV antibiotics he is in dialysis so they could potentially do this for him, Monday. Nonetheless I want to try to get that started ASAP. Again I would prefer him be initiated with IV vancomycin and cefepime just as an empiric broad-spectrum until we can get the culture results back at least. Subsequently also think that this should be done sooner rather Demonte, [place] ([id number]) than later due to the fact that his leg is more swollen and overall this seems to have progressed fairly rapidly since last week I do not want this to get worse. He voiced understanding. 01-24-2022 upon evaluation today patient appears to be doing well with regard to his main ulcer that we have been seeing him for. I am very pleased with where things stand currently. He still continues to have bleeding if we do any type of aggressive sharp debridement there for him having to be very careful using Dakin's solution along with debridement as I can here in the office. Overall the patient seems to be doing well. Unfortunately the lateral foot wound still has progressed a little bit further. We did get him on antibiotics starting this past Monday I think the drainage looks much better and overall I am seeing improvement but again this was already broken down to a certain degree I am hopeful by next week this will really start turnaround for [place]. We will also get a send him for an x-ray to evaluate for any signs of osteomyelitis on this lateral foot. 01-31-2022 upon evaluation today patient's wound on the medial  left amputation site is actually doing excellent. I am very pleased with where things  stand this is significantly smaller and we are headed in the right direction. In regard to the left lateral foot there is still some necrotic tissue to be cleared away although I see evidence of this starting to basically solidify as far as the edges are concerned with how far it spread I do not see anything seeming to want to spread further. I believe this may be close to done as far as any evolving is concerned. 02-07-2022 upon evaluation today patient appears to be doing well with regard to the main amputation site. Unfortunately the secondary site on the left lateral foot is still giving him some trouble here. There is a lot of dried tissue and I do believe that along with the Dakin's we may want to use a contact layer in the base of the wound to try to prevent the bone from drying out because we are down to that point at this time. The patient voiced understanding at this point as well and is happy to do what ever it takes to try to get this better. With that being said I do believe that it is possible he may benefit from a wound VAC. Again the question is the approval process for getting this done obviously we can see what we can do if we get things cleaned a little bit better but right now I think the best option is going to be for [place] to continue with the packing currently. 02-14-2022 upon evaluation today patient presents for follow-up concerning ongoing issues with his foot. He did have the MRI which does show that he has evidence of osteomyelitis of the digit at the fifth location of his foot. Subsequently this does extend down to the base of the digit and into the proximal portion of the metatarsal as well. With that being said the bone does not appear to be too severely broken down and a lot of the marrow signal is stated to be maintained which is good news. Nonetheless I do believe that he is unfortunately  still in a spot where this is precarious and we need to make sure that we are treating this appropriately. He does seem to be doing well with IV antibiotics he also seems to be doing quite well when it comes to the overall appearance of the wound bed at this point. I am happy in that regard. 02-21-2022 upon evaluation today patient appears to be doing well all things considered in regard to his wounds. I do feel like that he is making some progress here. The wound on the right distal foot where the amputation site is is actually healing nicely I feel like we can switch over to collagen at this point that should do well for him. In regard to the left lateral foot this is actually getting much cleaner he still has some area where there seems to be some pressure getting to the location I think that he would do well with a knee scooter at this point. I discussed that with him he does not seem prone to going that route at this point he understands it can definitely help. 02-28-2022 upon evaluation today patient appears to be doing well currently with regard to the wound on his foot. The main surgical wound is actually significantly improved and the lateral wound is doing better there is still bone exposed but nonetheless this does not appear to be significantly worse than what we have noted previous. In fact I think it looks better than it  did last week. My goal is to keep the bone from drying out and subsequently for that reason we are going to go ahead and proceed with the EpiFix which I think is good to be a good option here for him. Over top of the EpiFix we will do a contact layer in order to hopefully keep things from drying out. This dressing will actually stay in place until he comes back. Patient did see vascular yesterday and states that they did not feel like anything was doing worse still it appears that if it were left up to them they think the ideal thing would be to proceed with an amputation. I  explained to the patient that to be honest is not always avoidable but if we can try to avoid amputation that is what we are after. He voiced understanding and he does want to do what ever he can to try to prevent amputation for that reason we are proceeding with the epi cord today. This will be application #1. [id number] upon evaluation today patient appears to be making some progress here regard to the lateral foot. Fortunately there does not appear to be any signs of active infection locally or systemically which is great news. No fevers, chills, nausea, vomiting, or diarrhea. I do think that he is appropriate for second application of EpiCord today. Objective Constitutional Well-nourished and well-hydrated in no acute distress. Vitals Time Taken: 12:49 PM, Height: 70 in, Weight: 180.4 lbs, BMI: 25.9, Temperature: 98.1 F, Pulse: 66 bpm, Respiratory Rate: 16 breaths/min, Blood Pressure: 132/68 mmHg. Respiratory normal breathing without difficulty. Psychiatric this patient is able to make decisions and demonstrates good insight into disease process. Alert and Oriented x 3. pleasant and cooperative. General Notes: Upon inspection patient's wound on the medial aspect is actually doing excellent I am very pleased with what we see in there. On the lateral aspect he did have some callus around the edge also some sharp debridement was needed to clear away some of the surface of the wound. I performed this today without complication postdebridement wound bed appears to be doing much better which is great news. Overall I Kathol, Jontay L. ([id number]) think we are on the right track here. Integumentary (Hair, Skin) Wound #1 status is Open. Original cause of wound was Surgical Injury. The date acquired was: 09/09/2021. The wound has been in treatment 15 weeks. 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 The wound measures 3cm length x 1cm width x 0.7cm depth; 2.356cm^2 area and 1.649cm^3  volume. There is Fat Layer (Subcutaneous Tissue) exposed. There is no tunneling or undermining noted. There is a medium amount of serous drainage noted. There is medium (34-66%) red, pink granulation within the wound bed. There is a medium (34-66%) amount of necrotic tissue within the wound bed including Adherent Slough. Wound #2 status is Open. Original cause of wound was Blister. The date acquired was: 10/28/2021. The wound has been in treatment 14 weeks. The wound is located on the Left,Lateral Foot. The wound measures 4cm length x 2.2cm width x 0.7cm depth; 6.912cm^2 area and 4.838cm^3 volume. There is joint, tendon, and Fat Layer (Subcutaneous Tissue) exposed. There is no tunneling or undermining noted. There is a medium amount of serosanguineous drainage noted. There is small (1-33%) red granulation within the wound bed. There is a large (67-100%) amount of necrotic tissue within the wound bed including Eschar and Adherent Slough. 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 There was a Excisional Skin/Subcutaneous Tissue Debridement with a total area of 8.8 sq cm performed by Tommie Sams., PA-C. With the following instrument(s): Curette to remove Viable and Non-Viable tissue/material. Material removed includes Subcutaneous Tissue and Slough and. No specimens were taken. A time out was conducted at 13:10, prior  to the start of the procedure. A Minimum amount of bleeding was controlled with Pressure. The procedure was tolerated well with a pain level of 0 throughout and a pain level of 0 following the procedure. Post Debridement Measurements: 4cm length x 2.2cm width x 0.7cm depth; 4.838cm^3 volume. Character of Wound/Ulcer Post Debridement is improved. Severity of Tissue Post Debridement is: Fat layer exposed. Post procedure Diagnosis Wound #2: Same as Pre-Procedure Pre-procedure diagnosis of Wound #2 is a Diabetic Wound/Ulcer of the Lower Extremity located on the Left,Lateral Foot. A skin graft procedure using a bioengineered skin substitute/cellular or tissue based product was performed by Tommie Sams., PA-C with the following instrument(s): Curette. Epicord was applied and secured with Steri-Strips. 6 sq cm of product was utilized and 0 sq cm was wasted. Post Application, adaptic was applied. A Time Out was conducted at 13:10, prior to the start of the procedure. The procedure was tolerated well with a pain level of 0 throughout and a pain level of 0 following the procedure. Post procedure Diagnosis Wound #2: Same as Pre-Procedure . Plan Follow-up Appointments: Return Appointment in 1 week. - provider visits at wound center Return Appointment in: - HBO- (daily Monday -Friday for HBO Treatments) Home Health: [facility] for wound care. May utilize formulary equivalent dressing for wound treatment orders unless otherwise specified. Home Health Nurse may visit PR[facility] to address patient s wound care needs. Scheduled days for dressing changes to be completed; exception, patient has scheduled wound care visit that day. **Please direct any NON-WOUND related issues/requests for orders to patient's Primary Care Physician. **If current dressing causes regression in wound condition, may D/C ordered dressing product/s and apply Normal Saline Moist Dressing daily until  next [place] or Other MD appointment. **Notify Wound Healing Center of regression in wound condition at [phone]. Bathing/ Shower/ Hygiene: [place], [place] ([id number]) Clean wound with Normal Saline or wound cleanser. May shower with wound dressing protected with water repellent cover or cast protector. No tub bath. Cellular or Tissue Based Products: Wound #2 Left,Lateral Foot: Cellular or Tissue Based Product Type: - epifix #2 Cellular or Tissue Based Product applied to wound bed; including contact layer, fixation with steri-strips, dry gauze and cover dressing. (DO NOT REMOVE). Edema Control - Lymphedema / Segmental Compressive Device / Other: Elevate, Exercise Daily and Avoid Standing for Long Periods of Time. Elevate legs to the level of the heart and pump ankles as often as possible Elevate leg(s) parallel to the floor when sitting. Off-Loading: Foot Defender  - size large Additional Orders / Instructions: Follow Nutritious Diet and Increase Protein Intake Other: - Follow vascular, nephrology and PCP for wound care and other needs Dialysis appt as  scheduled M-W-F Hyperbaric Oxygen Therapy: Indication and location: - Left 1st and 2nd Ray Amputation osteomyelitis and left lateral foot osteomyelitis. I added treatments onto the original regimen to account for the lateral foot osteomyelitis. If appropriate for treatment, begin HBOT per protocol: 2.0 ATA for 90 Minutes without Air Breaks One treatment per day (delivered Monday through Friday unless otherwise specified in Special Instructions below): Total # of Treatments: - 100 Finger stick Blood Glucose Pre- and Post- HBOT Treatment. Follow Hyperbaric Oxygen Glycemia Protocol WOUND #1: - Amputation Site - Digit Wound Laterality: Left Cleanser: Wound Cleanser 3 x Per Week/30 Days Discharge Instructions: Wash your hands with soap and water. Remove old dressing, discard into plastic bag and place into trash. Cleanse  the wound with Wound Cleanser prior to applying a clean dressing using gauze sponges, not tissues or cotton balls. Do not scrub or use excessive force. Pat dry using gauze sponges, not tissue or cotton balls. Primary Dressing: Prisma 4.34 (in) 3 x Per Week/30 Days Discharge Instructions: Moisten w/normal saline or sterile water; Cover wound as directed. Do not remove from wound bed. Secured With: Medipore Tape - 718M Medipore H Soft Cloth Surgical Tape, 2x2 (in/yd) 3 x Per Week/30 Days Secured With: [employer] Sterile or Non-Sterile 6-ply 4.5x4 (yd/yd) 3 x Per Week/30 Days Discharge Instructions: Apply Kerlix as directed Secured With: ABD pad 3 x Per Week/30 Days WOUND #2: - Foot Wound Laterality: Left, Lateral Cleanser: Wound Cleanser 3 x Per Week/30 Days Discharge Instructions: Wash your hands with soap and water. Remove old dressing, discard into plastic bag and place into trash. Cleanse the wound with Wound Cleanser prior to applying a clean dressing using gauze sponges, not tissues or cotton balls. Do not scrub or use excessive force. Pat dry using gauze sponges, not tissue or cotton balls. Secondary Dressing: ABD Pad 5x9 (in/in) 3 x Per Week/30 Days Discharge Instructions: Cover with ABD pad Secondary Dressing: Kerlix 4.5 x 4.1 (in/yd) 3 x Per Week/30 Days Discharge Instructions: Apply Kerlix 4.5 x 4.1 (in/yd) as instructed Secured With: Medipore Tape - 718M Medipore H Soft Cloth Surgical Tape, 2x2 (in/yd) 3 x Per Week/30 Days Secured With: ABD pad 3 x Per Week/30 Days 1. I would recommend currently that we go ahead and continue with the [place] I did reapply that today to the lateral foot. I do believe that he is showing some signs of improvement although what will really after is trying to get some tissue growing over top of this bone. That is good to be the primary concern. Until we get that tissue covering this is going to still be a touch and go situation which I discussed with him. 2.  I am also going to recommend at this time that we continue with the HBO therapy which I do believe is still helpful for him this will probably be the last extension that we do at this time. 3. I am also can recommend that we have the patient continue with the collagen for the medial aspect of the foot that is getting smaller every time I see him I think we are getting close to complete resolution. 4. He will continue with the foot defender boot although I really think using crutches would do well for him and he voiced understanding. He is going to see about picking up a pair and see what he can do to try to keep pressure off of the foot as well. We will see patient back for reevaluation in 1  week here in the clinic. If anything worsens or changes patient will contact our office for additional recommendations. Electronic Signature(s) Signed: 03/07/2022 1:45:06 PM By: Worthy Keeler PA-C Entered By: Worthy Keeler on 03/07/2022 13:45:06 Balderson, Gjon L. ([id number]) -------------------------------------------------------------------------------- SuperBill Details Patient Name: Siddall, Kazuto L. Date of Service: 03/07/2022 Medical Record Number: [id number] Patient Account Number: [id number] Date of Birth/Sex: Jan 26, 1966 (56 y.o. 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 R26.89 Other abnormalities of gait and  mobility Facility Procedures CPT4 Code: 68115726 Description: O0355 Epicord 2cmx3cm billed per Sq cm (6) Modifier: Quantity: 6 CPT4 Code: 97416384 Description: 53646 - SKIN SUB GRAFT FACE/NK/HF/G Modifier: Quantity: 1 CPT4 Code: Description: ICD-10 Diagnosis Description L97.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

## 2022-03-08 NOTE — Progress Notes (Signed)
Aguayo, [place] ([id number]) Visit Report for 03/07/2022 Arrival Information Details Patient Name: [name], [name]. Date of Service: 03/07/2022 11:00 AM Medical Record Number: [id number] Patient Account Number: [id number] Date of Birth/Sex: August 18, 1966 (57 y.o. M) Treating RN: [name] Primary Care Sarthak Rubenstein: [name] Other Clinician: Jacqulyn Bath Referring Lawton Dollinger: [name] Treating Yul Diana/Extender: [name] in Treatment: 18 Visit Information History Since Last Visit Added or deleted any medications: No Patient Arrived: Ambulatory Any new allergies or adverse reactions: No Arrival Time: 09:55 Had a fall or experienced change in No Accompanied By: self activities of daily living that may affect Transfer Assistance: None risk of falls: Patient Identification Verified: Yes Signs or symptoms of abuse/neglect since last visito No Secondary Verification Process Completed: Yes Hospitalized since last visit: No Patient Requires Transmission-Based No Implantable device outside of the clinic excluding No Precautions: cellular tissue based products placed in the center Patient Has Alerts: Yes since last visit: Patient Alerts: Patient on Blood Has Dressing in Place as Prescribed: Yes Thinner Has Footwear/Offloading in Place as Prescribed: Yes Left: Other:Defender Boot Pain Present Now: No Electronic Signature(s) Signed: 03/07/2022 1:02:26 PM By: Enedina Finner RCP, RRT, CHT Entered By: Stark Jock, Amado Nash on 03/07/2022 10:31:52 [name], [name] ([id number]) -------------------------------------------------------------------------------- Encounter Discharge Information Details Patient Name: [name], [name] Date of Service: 03/07/2022 11:00 AM Medical Record Number: [id number] Patient Account Number: [id number] Date of Birth/Sex: 06-Sep-1966 (56 y.o. M) Treating RN: [name] Primary Care Daylen Lipsky: [name] Other Clinician: Jacqulyn Bath Referring Kohner Orlick: [name] Treating Jaizon Deroos/Extender: [name] in Treatment: 18 Encounter Discharge Information Items Discharge Condition: Stable Ambulatory Status: Ambulatory Discharge Destination: Home Transportation: Private Auto Accompanied By: self Schedule Follow-up Appointment: Yes Clinical Summary of Care: Notes Patient has an HBO treatment scheduled on 03/10/22 at 11:00 am. Electronic Signature(s) Signed: 03/07/2022 1:02:26 PM By: Enedina Finner RCP, RRT, CHT Entered By: Stark Jock, Amado Nash on 03/07/2022 13:01:18 [name], [name] ([id number]) -------------------------------------------------------------------------------- Vitals Details Patient Name: [name], [name] Date of Service: 03/07/2022 11:00 AM Medical Record Number: [id number] Patient Account Number: [id number] Date of Birth/Sex: January 02, 1966 (56 y.o. M) Treating RN: [name] Primary Care Karinne Schmader: [name] Other Clinician: Jacqulyn Bath Referring Huxton Glaus: [name] Treating Lakendria Nicastro/Extender: [name] in Treatment: 18 Vital Signs Time Taken: 10:03 Temperature (F): 97.8 Height (in): 70 Pulse (bpm): 72 Weight (lbs): 180.4 Respiratory Rate (breaths/min): 16 Body Mass Index (BMI): 25.9 Blood Pressure (mmHg): 126/72 Capillary Blood Glucose (mg/dl): 136 Reference Range: 80 - 120 mg / dl Electronic Signature(s) Signed: 03/07/2022 1:02:26 PM By: Enedina Finner RCP, RRT, CHT Entered By: Enedina Finner on 03/07/2022 10:32:36

## 2022-03-08 NOTE — Progress Notes (Signed)
Gohman, [place] ([id number]) Visit Report for 03/06/2022 Problem List Details Patient Name: [name], [name]. Date of Service: 03/06/2022 8:00 AM Medical Record Number: [id number] Patient Account Number: [id number] Date of Birth/Sex: [birth year] (56 y.o. 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 other part of left foot with necrosis of 10/31/2021 No Yes bone M86.672 Other chronic osteomyelitis, left ankle and foot 10/31/2021 No Yes I10 Essential (primary) hypertension 10/31/2021 No Yes I25.10 Atherosclerotic heart disease of native coronary artery without angina 10/31/2021 No Yes pectoris 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 ([id number]) -------------------------------------------------------------------------------- SuperBill Details Patient Name: [name], [name] Date of Service: 03/06/2022 Medical Record Number: [id number] Patient Account Number: [id number] Date of Birth/Sex: 01-10-1966 (56 y.o. 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 R26.89 Other abnormalities of gait and mobility Facility Procedures CPT4 Code: 56701410 Description: (Facility Use Only) HBOT, full body chamber, 12min Modifier: Quantity: 4 Physician Procedures CPT4 Code: 3013143 Description: 88875 - WC PHYS HYPERBARIC OXYGEN THERAPY Modifier: Quantity: 1 CPT4 Code: Description: ICD-10 Diagnosis Description M86.672 Other chronic osteomyelitis, left ankle and foot 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.621 Type 2 diabetes mellitus with foot ulcer Modifier: e Quantity: Electronic Signature(s) Signed: 03/06/2022 5:47:40 PM By: Worthy Keeler PA-C Previous Signature: 03/06/2022 3:48:59 PM Version By: Enedina Finner RCP, RRT, CHT Entered By: Worthy Keeler on 03/06/2022 17:47:39

## 2022-03-08 NOTE — Progress Notes (Signed)
K[place]rell, [place] ([id number]) Visit Report for 03/07/2022 Problem List Details Patient Name: [name], [name]. Date of Service: 03/07/2022 11:00 AM Medical Record Number: [id number] Patient Account Number: [id number] Date of Birth/Sex: 12-13-1965 (56 y.o. 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 other part of left foot with necrosis of 10/31/2021 No Yes bone M86.672 Other chronic osteomyelitis, left ankle and foot 10/31/2021 No Yes I10 Essential (primary) hypertension 10/31/2021 No Yes I25.10 Atherosclerotic heart disease of native coronary artery without angina 10/31/2021 No Yes pectoris 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 Date of Service: 03/07/2022 Medical Record Number: [id number] Patient Account Number: [id number] Date of Birth/Sex: 1966-02-24 (56 y.o. 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 R26.89 Other abnormalities of gait and mobility Facility Procedures CPT4 Code: 97026378 Description: (Facility Use Only) HBOT, full body chamber, 46min Modifier: Quantity: 4 Physician Procedures CPT4 Code: 5885027 Description: 74128 - WC PHYS HYPERBARIC OXYGEN THERAPY Modifier: Quantity: 1 CPT4 Code: Description: ICD-10 Diagnosis Description M86.672 Other chronic osteomyelitis, left ankle and foot 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.621 Type 2 diabetes mellitus with foot ulcer Modifier: e Quantity: Electronic Signature(s) Signed: 03/07/2022 1:46:44 PM By: Worthy Keeler PA-C Previous Signature: 03/07/2022 1:02:26 PM Version By: Enedina Finner RCP, RRT, CHT Entered By: Worthy Keeler on 03/07/2022 13:46:44

## 2022-03-08 NOTE — Progress Notes (Signed)
Shanker, [place] ([id number]) Visit Report for 03/07/2022 HBO Details Patient Name: [name], [name] Date of Service: 03/07/2022 11:00 AM Medical Record Number: [id number] Patient Account Number: [id number] Date of Birth/Sex: 1966-09-17 (56 y.o. 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 HBO Treatment Details Treatment Number: 82 Patient Type: Outpatient Chamber Type: Monoplace Chamber Serial #: [id number] Treatment Protocol: 2.0 ATA with 90 minutes oxygen, and no air breaks Treatment Details Compression Rate Down: 1.0 psi / minute De-Compression Rate Up: 1.5 psi / minute Compress Tx Pressure Air breaks and breathing periods Decompress Decompress Begins Reached (leave unused spaces blank) Begins Ends Chamber Pressure (ATA) 1 2 - - - - - - 2 1 Clock Time (24 hr) 10:13 10:30 - - - - - - 12:00 12:10 Treatment Length: 117 (minutes) Treatment Segments: 4 Vital Signs Capillary Blood Glucose Reference Range: 80 - 120 mg / dl HBO Diabetic Blood Glucose Intervention Range: <131 mg/dl or >249 mg/dl Time Vitals Blood Respiratory Capillary Blood Glucose Pulse Action Type: Pulse: Temperature: Taken: Pressure: Rate: Glucose (mg/dl): Meter #: Oximetry (%) Taken: Pre 10:03 126/72 72 16 97.8 136 1 none per protocol Post 12:21 132/68 66 16 98.1 180 1 none per protocol Treatment Response Treatment Toleration: Well Treatment Completion Treatment Completed without Adverse Event Status: HBO Attestation I certify that I supervised this HBO treatment in accordance with Medicare guidelines. A trained emergency response team is readily  Yes available per hospital policies and procedures. Continue HBOT as ordered. Yes Electronic Signature(s) Signed: 03/07/2022 1:46:40 PM By: Worthy Keeler PA-C Previous Signature: 03/07/2022 1:02:26 PM Version By: Enedina Finner RCP, RRT, CHT Entered By: Worthy Keeler on 03/07/2022 13:46:39 [name], [name] ([id number]) -------------------------------------------------------------------------------- HBO Safety Checklist Details Patient Name: [name], [name] Date of Service: 03/07/2022 11:00 AM Medical Record Number: [id number] Patient Account Number: [id number] Date of Birth/Sex: November 29, 1965 (56 y.o. M) Treating RN: [name] Primary Care Milly Goggins: [name] Other Clinician: Jacqulyn Bath Referring Destynee Stringfellow: [name] Treating Ryleeann Urquiza/Extender: [name] in Treatment: 18 HBO Safety Checklist Items Safety Checklist Consent Form Signed Patient voided / foley secured and emptied When did you last eato 09:30 am Last dose of injectable or oral agent n/a Ostomy pouch emptied and vented if applicable NA All implantable devices assessed, documented and approved NA Intravenous access site secured and place NA Valuables secured Linens and cotton and cotton/polyester blend (less than 51% polyester) Personal oil-based products / skin lotions / body lotions removed Wigs or hairpieces removed NA Smoking or tobacco materials removed NA Books / newspapers / magazines / loose paper removed NA Cologne, aftershave, perfume and deodorant removed Jewelry removed (may wrap wedding band) Make-up removed NA Hair care products removed Battery operated devices (external) removed NA Heating patches and chemical warmers removed NA Titanium eyewear removed NA Nail polish cured greater than 10 hours NA Casting material cured greater than 10 hours NA Hearing aids removed NA Loose dentures or partials removed NA Prosthetics have been removed NA Patient  demonstrates correct use of air break device (if applicable) Patient concerns have been addressed Patient grounding bracelet on and cord attached to chamber Specifics for Inpatients (complete in addition to above) Medication sheet sent with patient Intravenous medications needed or  due during therapy sent with patient Drainage tubes (e.g. nasogastric tube or chest tube secured and vented) Endotracheal or Tracheotomy tube secured Cuff deflated of air and inflated with saline Airway suctioned Electronic Signature(s) Signed: 03/07/2022 1:02:26 PM By: Enedina Finner RCP, RRT, CHT Entered By: Enedina Finner on 03/07/2022 10:33:39

## 2022-03-10 ENCOUNTER — Encounter: Payer: Medicare Other | Admitting: Physician Assistant

## 2022-03-10 DIAGNOSIS — Z992 Dependence on renal dialysis: Secondary | ICD-10-CM | POA: Diagnosis not present

## 2022-03-10 DIAGNOSIS — I110 Hypertensive heart disease with heart failure: Secondary | ICD-10-CM | POA: Diagnosis not present

## 2022-03-10 DIAGNOSIS — Z5181 Encounter for therapeutic drug level monitoring: Secondary | ICD-10-CM | POA: Diagnosis not present

## 2022-03-10 DIAGNOSIS — M86672 Other chronic osteomyelitis, left ankle and foot: Secondary | ICD-10-CM | POA: Diagnosis not present

## 2022-03-10 DIAGNOSIS — N2581 Secondary hyperparathyroidism of renal origin: Secondary | ICD-10-CM | POA: Diagnosis not present

## 2022-03-10 DIAGNOSIS — I2510 Atherosclerotic heart disease of native coronary artery without angina pectoris: Secondary | ICD-10-CM | POA: Diagnosis not present

## 2022-03-10 DIAGNOSIS — N186 End stage renal disease: Secondary | ICD-10-CM | POA: Diagnosis not present

## 2022-03-10 DIAGNOSIS — D631 Anemia in chronic kidney disease: Secondary | ICD-10-CM | POA: Diagnosis not present

## 2022-03-10 DIAGNOSIS — E11621 Type 2 diabetes mellitus with foot ulcer: Secondary | ICD-10-CM | POA: Diagnosis not present

## 2022-03-10 DIAGNOSIS — L02612 Cutaneous abscess of left foot: Secondary | ICD-10-CM | POA: Diagnosis not present

## 2022-03-10 DIAGNOSIS — L97524 Non-pressure chronic ulcer of other part of left foot with necrosis of bone: Secondary | ICD-10-CM | POA: Diagnosis not present

## 2022-03-10 DIAGNOSIS — I5042 Chronic combined systolic (congestive) and diastolic (congestive) heart failure: Secondary | ICD-10-CM | POA: Diagnosis not present

## 2022-03-10 DIAGNOSIS — I96 Gangrene, not elsewhere classified: Secondary | ICD-10-CM | POA: Diagnosis not present

## 2022-03-10 LAB — GLUCOSE, CAPILLARY
Glucose-Capillary: 167 mg/dL — ABNORMAL HIGH (ref 70–99)
Glucose-Capillary: 116 mg/dL — ABNORMAL HIGH (ref 70–99)

## 2022-03-10 NOTE — Progress Notes (Signed)
Satter, [place] ([id number]) Visit Report for 03/10/2022 Arrival Information Details Patient Name: [name], [name]. Date of Service: 03/10/2022 11:00 AM Medical Record Number: [id number] Patient Account Number: [id number] Date of Birth/Sex: 05-Jul-1966 (56 y.o. M) Treating RN: [name] Primary Care Branndon Tuite: [name] Other Clinician: Jacqulyn Bath Referring Remee Charley: [name] Treating Merci Walthers/Extender: [name] in Treatment: 18 Visit Information History Since Last Visit Added or deleted any medications: No Patient Arrived: Ambulatory Any new allergies or adverse reactions: No Arrival Time: 10:30 Had a fall or experienced change in No Accompanied By: self activities of daily living that may affect Transfer Assistance: None risk of falls: Patient Identification Verified: Yes Signs or symptoms of abuse/neglect since last visito No Secondary Verification Process Completed: Yes Hospitalized since last visit: No Patient Requires Transmission-Based No Implantable device outside of the clinic excluding No Precautions: cellular tissue based products placed in the center Patient Has Alerts: Yes since last visit: Patient Alerts: Patient on Blood Has Dressing in Place as Prescribed: Yes Thinner Has Footwear/Offloading in Place as Prescribed: Yes Left: Other:Defender Boot; crutcdhes Pain Present Now: No Electronic Signature(s) Signed: 03/10/2022 1:56:23 PM By: Enedina Finner RCP, RRT, CHT Entered By: Stark Jock, Amado Nash on 03/10/2022 11:02:57 [name], [name] ([id number]) -------------------------------------------------------------------------------- Encounter Discharge Information Details Patient Name: [name], [name] Date of Service: 03/10/2022 11:00 AM Medical Record Number: [id number] Patient Account Number: [id number] Date of Birth/Sex: 1966/07/31 (56 y.o. M) Treating RN: [name] Primary Care Zyrah Wiswell: [name] Other Clinician:  Jacqulyn Bath Referring Francie Keeling: [name] Treating Macari Zalesky/Extender: [name] in Treatment: 18 Encounter Discharge Information Items Discharge Condition: Stable Ambulatory Status: Ambulatory Discharge Destination: Home Transportation: Private Auto Accompanied By: self Schedule Follow-up Appointment: Yes Clinical Summary of Care: Notes Patient has an HBO treatment scheduled on 03/11/22 at 08:00 am. Electronic Signature(s) Signed: 03/10/2022 1:56:23 PM By: Enedina Finner RCP, RRT, CHT Entered By: Enedina Finner on 03/10/2022 13:56:03 [place], [place] ([id number]) -------------------------------------------------------------------------------- Vitals Details Patient Name: [name], [name] Date of Service: 03/10/2022 11:00 AM Medical Record Number: [id number] Patient Account Number: [id number] Date of Birth/Sex: 1966/01/07 (56 y.o. M) Treating RN: [name] Primary Care Emika Tiano: [name] Other Clinician: Jacqulyn Bath Referring Tejah Brekke: [name] Treating Sanaya Gwilliam/Extender: [name] in Treatment: 18 Vital Signs Time Taken: 10:40 Temperature (F): 98.5 Height (in): 70 Pulse (bpm): 72 Weight (lbs): 180.4 Respiratory Rate (breaths/min): 16 Body Mass Index (BMI): 25.9 Blood Pressure (mmHg): 128/70 Capillary Blood Glucose (mg/dl): 167 Reference Range: 80 - 120 mg / dl Electronic Signature(s) Signed: 03/10/2022 1:56:23 PM By: Enedina Finner RCP, RRT, CHT Entered By: Enedina Finner on 03/10/2022 11:03:27

## 2022-03-10 NOTE — Progress Notes (Signed)
Ives, [place] ([id number]) Visit Report for 03/10/2022 HBO Details Patient Name: [name], [name]. Date of Service: 03/10/2022 11:00 AM Medical Record Number: [id number] Patient Account Number: [id number] Date of Birth/Sex: 11-24-1965 (56 y.o. M) Treating RN: [name] Primary Care Yacob Wilkerson: [name] Other Clinician: Jacqulyn Bath Referring Careli Luzader: [name] Treating Juanitta Earnhardt/Extender: [name] in Treatment: 18 HBO Treatment Course Details Treatment Course Number: 1 Ordering Leopoldo Mazzie: Jeri Cos Total Treatments Ordered: 100 HBO Treatment Start Date: 11/04/2021 HBO Indication: Chronic Refractory Osteomyelitis to Left Foot HBO Treatment Details Treatment Number: 83 Patient Type: Outpatient Chamber Type: Monoplace Chamber Serial #: [id number] Treatment Protocol: 2.0 ATA with 90 minutes oxygen, and no air breaks Treatment Details Compression Rate Down: 1.0 psi / minute De-Compression Rate Up: 1.5 psi / minute Compress Tx Pressure Air breaks and breathing periods Decompress Decompress Begins Reached (leave unused spaces blank) Begins Ends Chamber Pressure (ATA) 1 2 - - - - - - 2 1 Clock Time (24 hr) 10:48 11:05 - - - - - - 12:35 12:45 Treatment Length: 117 (minutes) Treatment Segments: 4 Vital Signs Capillary Blood Glucose Reference Range: 80 - 120 mg / dl HBO Diabetic Blood Glucose Intervention Range: <131 mg/dl or >249 mg/dl Time Vitals Blood Respiratory Capillary Blood Glucose Pulse Action Type: Pulse: Temperature: Taken: Pressure: Rate: Glucose (mg/dl): Meter #: Oximetry (%) Taken: Pre 10:40 128/70 72 16 98.5 167 1 none per protocol Post 12:54 126/68 72 16 98.1 116 1 none per protocol Treatment Response Treatment Toleration: Well Treatment Completion Treatment Completed without Adverse Event Status: Electronic Signature(s) Signed: 03/10/2022 1:56:23 PM By: Zackery Barefoot, RRT, CHT Signed: 03/10/2022 4:26:20 PM By: Worthy Keeler  PA-C Entered By: Stark Jock, Amado Nash on 03/10/2022 13:55:07 Breach, Quest L. ([id number]) -------------------------------------------------------------------------------- HBO Safety Checklist Details Patient Name: Nourse, Coye L. Date of Service: 03/10/2022 11:00 AM Medical Record Number: [id number] Patient Account Number: [id number] Date of Birth/Sex: Nov 13, 1965 (56 y.o. M) Treating RN: [name] Primary Care Narcissus Detwiler: [name] Other Clinician: Jacqulyn Bath Referring Jaclynne Baldo: [name] Treating Rayla Pember/Extender: [name] in Treatment: 18 HBO Safety Checklist Items Safety Checklist Consent Form Signed Patient voided / foley secured and emptied When did you last eato 09:30 am Last dose of injectable or oral agent n/a Ostomy pouch emptied and vented if applicable NA All implantable devices assessed, documented and approved NA Intravenous access site secured and place NA Valuables secured Linens and cotton and cotton/polyester blend (less than 51% polyester) Personal oil-based products / skin lotions / body lotions removed Wigs or hairpieces removed NA Smoking or tobacco materials removed NA Books / newspapers / magazines / loose paper removed NA Cologne, aftershave, perfume and deodorant removed Jewelry removed (may wrap wedding band) Make-up removed NA Hair care products removed Battery operated devices (external) removed NA Heating patches and chemical warmers removed NA Titanium eyewear removed NA Nail polish cured greater than 10 hours NA Casting material cured greater than 10 hours NA Hearing aids removed NA Loose dentures or partials removed NA Prosthetics have been removed NA Patient demonstrates correct use of air break device (if applicable) Patient concerns have been addressed Patient grounding bracelet on and cord attached to chamber Specifics for Inpatients (complete in addition to above) Medication sheet sent with  patient Intravenous medications needed or due during therapy sent with patient Drainage tubes (e.g. nasogastric tube or chest tube secured and vented) Endotracheal or Tracheotomy tube secured Cuff deflated of air and inflated with saline Airway suctioned Electronic Signature(s) Signed: 03/10/2022  1:56:23 PM By: Enedina Finner RCP, RRT, CHT Entered By: Enedina Finner on 03/10/2022 11:05:08

## 2022-03-11 ENCOUNTER — Encounter: Payer: Medicare Other | Admitting: Physician Assistant

## 2022-03-11 DIAGNOSIS — D631 Anemia in chronic kidney disease: Secondary | ICD-10-CM | POA: Diagnosis not present

## 2022-03-11 DIAGNOSIS — E11621 Type 2 diabetes mellitus with foot ulcer: Secondary | ICD-10-CM | POA: Diagnosis not present

## 2022-03-11 DIAGNOSIS — I120 Hypertensive chronic kidney disease with stage 5 chronic kidney disease or end stage renal disease: Secondary | ICD-10-CM | POA: Diagnosis not present

## 2022-03-11 DIAGNOSIS — N186 End stage renal disease: Secondary | ICD-10-CM | POA: Diagnosis not present

## 2022-03-11 DIAGNOSIS — Z4781 Encounter for orthopedic aftercare following surgical amputation: Secondary | ICD-10-CM | POA: Diagnosis not present

## 2022-03-11 DIAGNOSIS — M86672 Other chronic osteomyelitis, left ankle and foot: Secondary | ICD-10-CM | POA: Diagnosis not present

## 2022-03-11 DIAGNOSIS — L97524 Non-pressure chronic ulcer of other part of left foot with necrosis of bone: Secondary | ICD-10-CM | POA: Diagnosis not present

## 2022-03-11 DIAGNOSIS — I110 Hypertensive heart disease with heart failure: Secondary | ICD-10-CM | POA: Diagnosis not present

## 2022-03-11 DIAGNOSIS — I2510 Atherosclerotic heart disease of native coronary artery without angina pectoris: Secondary | ICD-10-CM | POA: Diagnosis not present

## 2022-03-11 DIAGNOSIS — I5042 Chronic combined systolic (congestive) and diastolic (congestive) heart failure: Secondary | ICD-10-CM | POA: Diagnosis not present

## 2022-03-11 DIAGNOSIS — E1151 Type 2 diabetes mellitus with diabetic peripheral angiopathy without gangrene: Secondary | ICD-10-CM | POA: Diagnosis not present

## 2022-03-11 DIAGNOSIS — E1122 Type 2 diabetes mellitus with diabetic chronic kidney disease: Secondary | ICD-10-CM | POA: Diagnosis not present

## 2022-03-11 LAB — GLUCOSE, CAPILLARY
Glucose-Capillary: 144 mg/dL — ABNORMAL HIGH (ref 70–99)
Glucose-Capillary: 158 mg/dL — ABNORMAL HIGH (ref 70–99)

## 2022-03-11 NOTE — Progress Notes (Signed)
[name], [name] ([id number]) Visit Report for 03/11/2022 Arrival Information Details Patient Name: [name], [name]. Date of Service: 03/11/2022 8:00 AM Medical Record Number: [id number] Patient Account Number: [id number] Date of Birth/Sex: 08-19-1966 (56 y.o. M) Treating RN: [name] Primary Care [name]: [name] Other Clinician: Jacqulyn [name] Referring [name]: [name] Treating [name]/Extender: [name] in Treatment: 18 Visit Information History Since Last Visit Added or deleted any medications: No Patient Arrived: Ambulatory Any new allergies or adverse reactions: No Arrival Time: 07:57 Had a fall or experienced change in No Accompanied By: self activities of daily living that may affect Transfer Assistance: None risk of falls: Patient Identification Verified: Yes Signs or symptoms of abuse/neglect since last visito No Secondary Verification Process Completed: Yes Hospitalized since last visit: No Patient Requires Transmission-Based No Implantable device outside of the clinic excluding No Precautions: cellular tissue based products placed in the center Patient Has Alerts: Yes since last visit: Patient Alerts: Patient on Blood Has Dressing in Place as Prescribed: Yes Thinner Has Footwear/Offloading in Place as Prescribed: Yes Left: Other:Defender Boot; crutches Pain Present Now: No Electronic Signature(s) Signed: 03/11/2022 11:50:43 AM By: [name] RCP, RRT, CHT Entered By: [name], [name] on 03/11/2022 08:22:23 [name], [name] ([id number]) -------------------------------------------------------------------------------- Encounter Discharge Information Details Patient Name: [name], [name] Date of Service: 03/11/2022 8:00 AM Medical Record Number: [id number] Patient Account Number: [id number] Date of Birth/Sex: 1966-05-12 (56 y.o. M) Treating RN: [name] Primary Care [name]: [name] Other Clinician:  Jacqulyn [name] Referring [name]: [name] Treating [name]/Extender: [name] in Treatment: 18 Encounter Discharge Information Items Discharge Condition: Stable Ambulatory Status: Ambulatory Discharge Destination: Home Transportation: Private Auto Accompanied By: self Schedule Follow-up Appointment: Yes Clinical Summary of Care: Notes Patient has an HBO treatment scheduled on 03/12/22 at 11:00 am. Electronic Signature(s) Signed: 03/11/2022 11:50:43 AM By: [name] RCP, RRT, CHT Entered By: [name], [name] on 03/11/2022 11:48:02 [name], [name] ([id number]) -------------------------------------------------------------------------------- Vitals Details Patient Name: [name], [name] Date of Service: 03/11/2022 8:00 AM Medical Record Number: [id number] Patient Account Number: [id number] Date of Birth/Sex: December 22, 1965 (56 y.o. M) Treating RN: [name] Primary Care Aloys Hupfer: [name] Other Clinician: Jacqulyn [name] Referring Jessly Lebeck: [name] Treating Khadijatou Borak/Extender: [name] in Treatment: 18 Vital Signs Time Taken: 08:04 Temperature (F): 98.0 Height (in): 70 Pulse (bpm): 66 Weight (lbs): 180.4 Respiratory Rate (breaths/min): 16 Body Mass Index (BMI): 25.9 Blood Pressure (mmHg): 128/70 Capillary Blood Glucose (mg/dl): 144 Reference Range: 80 - 120 mg / dl Electronic Signature(s) Signed: 03/11/2022 11:50:43 AM By: [name] RCP, RRT, CHT Entered By: [name] on 03/11/2022 08:22:54

## 2022-03-12 ENCOUNTER — Encounter (HOSPITAL_BASED_OUTPATIENT_CLINIC_OR_DEPARTMENT_OTHER): Payer: Medicare Other | Admitting: Internal Medicine

## 2022-03-12 DIAGNOSIS — I2510 Atherosclerotic heart disease of native coronary artery without angina pectoris: Secondary | ICD-10-CM | POA: Diagnosis not present

## 2022-03-12 DIAGNOSIS — E11621 Type 2 diabetes mellitus with foot ulcer: Secondary | ICD-10-CM

## 2022-03-12 DIAGNOSIS — D631 Anemia in chronic kidney disease: Secondary | ICD-10-CM | POA: Diagnosis not present

## 2022-03-12 DIAGNOSIS — L02612 Cutaneous abscess of left foot: Secondary | ICD-10-CM | POA: Diagnosis not present

## 2022-03-12 DIAGNOSIS — M86672 Other chronic osteomyelitis, left ankle and foot: Secondary | ICD-10-CM

## 2022-03-12 DIAGNOSIS — L97524 Non-pressure chronic ulcer of other part of left foot with necrosis of bone: Secondary | ICD-10-CM

## 2022-03-12 DIAGNOSIS — I110 Hypertensive heart disease with heart failure: Secondary | ICD-10-CM | POA: Diagnosis not present

## 2022-03-12 DIAGNOSIS — N2581 Secondary hyperparathyroidism of renal origin: Secondary | ICD-10-CM | POA: Diagnosis not present

## 2022-03-12 DIAGNOSIS — N186 End stage renal disease: Secondary | ICD-10-CM | POA: Diagnosis not present

## 2022-03-12 DIAGNOSIS — I5042 Chronic combined systolic (congestive) and diastolic (congestive) heart failure: Secondary | ICD-10-CM | POA: Diagnosis not present

## 2022-03-12 DIAGNOSIS — Z992 Dependence on renal dialysis: Secondary | ICD-10-CM | POA: Diagnosis not present

## 2022-03-12 DIAGNOSIS — I96 Gangrene, not elsewhere classified: Secondary | ICD-10-CM | POA: Diagnosis not present

## 2022-03-12 LAB — GLUCOSE, CAPILLARY
Glucose-Capillary: 165 mg/dL — ABNORMAL HIGH (ref 70–99)
Glucose-Capillary: 96 mg/dL (ref 70–99)
Glucose-Capillary: 140 mg/dL — ABNORMAL HIGH (ref 70–99)

## 2022-03-12 NOTE — Progress Notes (Signed)
[name], [name] ([id number]) Visit Report for 03/11/2022 HBO Details Patient Name: [name], [name] Date of Service: 03/11/2022 8:00 AM Medical Record Number: [id number] Patient Account Number: [id number] Date of Birth/Sex: November 02, 1965 (56 y.o. M) Treating RN: [name] Primary Care Darryel Diodato: [name] Other Clinician: Jacqulyn Bath Referring Emiyah Spraggins: [name] Treating Marcia Lepera/Extender: [name] in Treatment: 18 HBO Treatment Course Details Treatment Course Number: 1 Ordering Damario Gillie: Jeri Cos Total Treatments Ordered: 100 HBO Treatment Start Date: 11/04/2021 HBO Indication: Chronic Refractory Osteomyelitis to Left Foot HBO Treatment Details Treatment Number: 84 Patient Type: Outpatient Chamber Type: Monoplace Chamber Serial #: [id number] Treatment Protocol: 2.0 ATA with 90 minutes oxygen, and no air breaks Treatment Details Compression Rate Down: 1.0 psi / minute De-Compression Rate Up: 1.5 psi / minute Compress Tx Pressure Air breaks and breathing periods Decompress Decompress Begins Reached (leave unused spaces blank) Begins Ends Chamber Pressure (ATA) 1 2 - - - - - - 2 1 Clock Time (24 hr) 08:18 08:35 - - - - - - 10:05 10:15 Treatment Length: 117 (minutes) Treatment Segments: 4 Vital Signs Capillary Blood Glucose Reference Range: 80 - 120 mg / dl HBO Diabetic Blood Glucose Intervention Range: <131 mg/dl or >249 mg/dl Time Vitals Blood Respiratory Capillary Blood Glucose Pulse Action Type: Pulse: Temperature: Taken: Pressure: Rate: Glucose (mg/dl): Meter #: Oximetry (%) Taken: Pre 08:04 128/70 66 16 98 144 1 none per protocol Post 10:24 128/68 66 16 98.1 158 1 none per protocol Treatment Response Treatment Toleration: Well Treatment Completion Treatment Completed without Adverse Event Status: Electronic Signature(s) Signed: 03/11/2022 11:50:43 AM By: Zackery Barefoot, RRT, CHT Signed: 03/12/2022 5:52:44 PM By: Worthy Keeler  PA-C Entered By: Stark Jock, Amado Nash on 03/11/2022 11:46:59 [name], [name] ([id number]) -------------------------------------------------------------------------------- HBO Safety Checklist Details Patient Name: [name], [name] Date of Service: 03/11/2022 8:00 AM Medical Record Number: [id number] Patient Account Number: [id number] Date of Birth/Sex: October 17, 1966 (56 y.o. M) Treating RN: [name] Primary Care Hana Trippett: [name] Other Clinician: Jacqulyn Bath Referring Lidie Glade: [name] Treating Jazziel Fitzsimmons/Extender: [name] in Treatment: 18 HBO Safety Checklist Items Safety Checklist Consent Form Signed Patient voided / foley secured and emptied When did you last eato 07:00 am Last dose of injectable or oral agent n/a Ostomy pouch emptied and vented if applicable NA All implantable devices assessed, documented and approved NA Intravenous access site secured and place NA Valuables secured Linens and cotton and cotton/polyester blend (less than 51% polyester) Personal oil-based products / skin lotions / body lotions removed Wigs or hairpieces removed NA Smoking or tobacco materials removed NA Books / newspapers / magazines / loose paper removed NA Cologne, aftershave, perfume and deodorant removed Jewelry removed (may wrap wedding band) Make-up removed NA Hair care products removed Battery operated devices (external) removed NA Heating patches and chemical warmers removed NA Titanium eyewear removed NA Nail polish cured greater than 10 hours NA Casting material cured greater than 10 hours NA Hearing aids removed NA Loose dentures or partials removed NA Prosthetics have been removed NA Patient demonstrates correct use of air break device (if applicable) Patient concerns have been addressed Patient grounding bracelet on and cord attached to chamber Specifics for Inpatients (complete in addition to above) Medication sheet sent with  patient Intravenous medications needed or due during therapy sent with patient Drainage tubes (e.g. nasogastric tube or chest tube secured and vented) Endotracheal or Tracheotomy tube secured Cuff deflated of air and inflated with saline Airway suctioned Electronic Signature(s) Signed: 03/11/2022  11:50:43 AM By: Enedina Finner RCP, RRT, CHT Entered By: Enedina Finner on 03/11/2022 08:23:46

## 2022-03-12 NOTE — Progress Notes (Addendum)
Sass, [place] ([id number]) Visit Report for 03/12/2022 HBO Details Patient Name: [name], [name] Date of Service: 03/12/2022 11:00 AM Medical Record Number: [id number] Patient Account Number: [id number] Date of Birth/Sex: 13-Dec-1965 (56 y.o. M) Treating RN: Cornell Barman Primary Care [place] Brogden: [name] Other Clinician: Jacqulyn Bath Referring Niley Helbig: [name] Treating Shepherd Finnan/Extender: Yaakov Guthrie in Treatment: 18 HBO Treatment Course Details Treatment Course Number: 1 Ordering Shelsy Seng: Jeri Cos Total Treatments Ordered: 100 HBO Treatment Start Date: 11/04/2021 HBO Indication: Chronic Refractory Osteomyelitis to Left Foot HBO Treatment Details Treatment Number: 85 Patient Type: Outpatient Chamber Type: Monoplace Chamber Serial #: [id number] Treatment Protocol: 2.0 ATA with 90 minutes oxygen, and no air breaks Treatment Details Compression Rate Down: 1.5 psi / minute De-Compression Rate Up: 1.5 psi / minute Compress Tx Pressure Air breaks and breathing periods Decompress Decompress Begins Reached (leave unused spaces blank) Begins Ends Chamber Pressure (ATA) 1 2 - - - - - - 2 1 Clock Time (24 hr) 11:04 11:15 - - - - - - 11:47 12:59 Treatment Length: 115 (minutes) Treatment Segments: 4 Vital Signs Capillary Blood Glucose Reference Range: 80 - 120 mg / dl HBO Diabetic Blood Glucose Intervention Range: <131 mg/dl or >249 mg/dl Time Vitals Blood Respiratory Capillary Blood Glucose Pulse Action Type: Pulse: Temperature: Taken: Pressure: Rate: Glucose (mg/dl): Meter #: Oximetry (%) Taken: Pre 10:15 140/88 78 16 98.2 165 treatment started Post 13:05 142/72 78 16 140 patient headed home Treatment Response Treatment Toleration: Well Treatment Completion Treatment Completed without Adverse Event Status: Franko Hilliker Notes BG =91 upon arrival. Patient has just eaten wait 30 recheck at 10:50 =BG 165 - treatment started HBO Attestation I certify that I  supervised this HBO treatment in accordance with Medicare guidelines. A trained emergency response team is readily Yes available per hospital policies and procedures. Continue HBOT as ordered. Yes Electronic Signature(s) Signed: 03/12/2022 2:24:38 PM By: Kalman Shan DO Previous Signature: 03/12/2022 1:10:37 PM Version By: Gretta Cool, BSN, RN, CWS, Kim RN, BSN Previous Signature: 03/12/2022 11:30:20 AM Version By: Gretta Cool BSN, RN, CWS, Kim RN, BSN Previous Signature: 03/12/2022 10:42:45 AM Version By: Gretta Cool, BSN, RN, CWS, Kim RN, BSN Entered By: Kalman Shan on 03/12/2022 14:20:30 Braziel, [place] ([id number]) -------------------------------------------------------------------------------- HBO Safety Checklist Details Patient Name: Cappello, [name] Date of Service: 03/12/2022 11:00 AM Medical Record Number: [id number] Patient Account Number: [id number] Date of Birth/Sex: [birth year] (56 y.o. M) Treating RN: Cornell Barman Primary Care Bryann Gentz: [name] Other Clinician: Jacqulyn Bath Referring Davis Vannatter: [name] Treating Capucine Tryon/Extender: Yaakov Guthrie in Treatment: 18 HBO Safety Checklist Items Safety Checklist Consent Form Signed Patient voided / foley secured and emptied When did you last eato 20 minutes ago Last dose of injectable or oral agent NA Ostomy pouch emptied and vented if applicable All implantable devices assessed, documented and approved NA Intravenous access site secured and place NA Valuables secured Linens and cotton and cotton/polyester blend (less than 51% polyester) Personal oil-based products / skin lotions / body lotions removed Wigs or hairpieces removed NA Smoking or tobacco materials removed NA Books / newspapers / magazines / loose paper removed NA Cologne, aftershave, perfume and deodorant removed Jewelry removed (may wrap wedding band) NA Make-up removed NA Hair care products removed NA Battery operated devices (external)  removed NA Heating patches and chemical warmers removed NA Titanium eyewear removed NA Nail polish cured greater than 10 hours NA Casting material cured greater than 10 hours NA Hearing aids removed NA Loose dentures or partials removed NA Prosthetics  have been removed NA Patient demonstrates correct use of air break device (if applicable) Patient concerns have been addressed Patient grounding bracelet on and cord attached to chamber Specifics for Inpatients (complete in addition to above) Medication sheet sent with patient Intravenous medications needed or due during therapy sent with patient Drainage tubes (e.g. nasogastric tube or chest tube secured and vented) Endotracheal or Tracheotomy tube secured Cuff deflated of air and inflated with saline Airway suctioned Electronic Signature(s) Signed: 03/12/2022 12:47:02 PM By: Gretta Cool, BSN, RN, CWS, Kim RN, BSN Previous Signature: 03/12/2022 11:21:20 AM Version By: Gretta Cool, BSN, RN, CWS, Kim RN, BSN Previous Signature: 03/12/2022 10:42:24 AM Version By: Gretta Cool, BSN, RN, CWS, Kim RN, BSN Entered By: Gretta Cool, BSN, RN, CWS, Kim on 03/12/2022 12:47:02

## 2022-03-12 NOTE — Progress Notes (Addendum)
Panjwani, [name] ([id number]) Visit Report for 03/12/2022 Arrival Information Details Patient Name: [name], [name]. Date of Service: 03/12/2022 11:00 AM Medical Record Number: [id number] Patient Account Number: [id number] Date of Birth/Sex: 1966-10-07 (56 y.o. M) Treating RN: Cornell Barman Primary Care Osaze Hubbert: [name] Other Clinician: Jacqulyn Bath Referring Trigo Winterbottom: [name] Treating Justina Bertini/Extender: Yaakov Guthrie in Treatment: 18 Visit Information History Since Last Visit Added or deleted any medications: No Patient Arrived: Crutches Has Dressing in Place as Prescribed: Yes Arrival Time: 10:15 Pain Present Now: No Transfer Assistance: None Patient Identification Verified: Yes Secondary Verification Process Completed: Yes Patient Requires Transmission-Based No Precautions: Patient Has Alerts: Yes Patient Alerts: Patient on Blood Thinner Electronic Signature(s) Signed: 03/12/2022 11:20:39 AM By: Gretta Cool, BSN, RN, CWS, Kim RN, BSN Entered By: Gretta Cool, BSN, RN, CWS, Kim on 03/12/2022 11:20:39 [name], [name] ([id number]) -------------------------------------------------------------------------------- Encounter Discharge Information Details Patient Name: [name], [name] Date of Service: 03/12/2022 11:00 AM Medical Record Number: [id number] Patient Account Number: [id number] Date of Birth/Sex: September 15, 1966 (56 y.o. M) Treating RN: Cornell Barman Primary Care Alisha Burgo: [name] Other Clinician: Jacqulyn Bath Referring Josslin Sanjuan: [name] Treating Rylyn Zawistowski/Extender: Yaakov Guthrie in Treatment: 6 Encounter Discharge Information Items Discharge Condition: Stable Ambulatory Status: Ambulatory Discharge Destination: Home Transportation: Private Auto Accompanied By: self Schedule Follow-up Appointment: Yes Clinical Summary of Care: Electronic Signature(s) Signed: 03/12/2022 1:11:19 PM By: Gretta Cool, BSN, RN, CWS, Kim RN, BSN Entered By: Gretta Cool, BSN, RN,  CWS, Kim on 03/12/2022 13:11:18 [name], [name] ([id number]) -------------------------------------------------------------------------------- Vitals Details Patient Name: [name], [name] Date of Service: 03/12/2022 11:00 AM Medical Record Number: [id number] Patient Account Number: [id number] Date of Birth/Sex: 11-Jan-1966 (56 y.o. M) Treating RN: Cornell Barman Primary Care Rayland Hamed: [name] Other Clinician: Jacqulyn Bath Referring Jannatul Wojdyla: [name] Treating Casimer Russett/Extender: Yaakov Guthrie in Treatment: 18 Vital Signs Time Taken: 10:20 Temperature (F): 98.2 Height (in): 70 Pulse (bpm): 78 Weight (lbs): 180.4 Respiratory Rate (breaths/min): 16 Body Mass Index (BMI): 25.9 Blood Pressure (mmHg): 140/88 Capillary Blood Glucose (mg/dl): 91 Reference Range: 80 - 120 mg / dl Electronic Signature(s) Signed: 03/12/2022 11:21:14 AM By: Gretta Cool, BSN, RN, CWS, Kim RN, BSN Entered By: Gretta Cool, BSN, RN, CWS, Kim on 03/12/2022 11:21:14

## 2022-03-13 ENCOUNTER — Encounter: Payer: Medicare Other | Admitting: Physician Assistant

## 2022-03-13 DIAGNOSIS — I5042 Chronic combined systolic (congestive) and diastolic (congestive) heart failure: Secondary | ICD-10-CM | POA: Diagnosis not present

## 2022-03-13 DIAGNOSIS — I110 Hypertensive heart disease with heart failure: Secondary | ICD-10-CM | POA: Diagnosis not present

## 2022-03-13 DIAGNOSIS — E11621 Type 2 diabetes mellitus with foot ulcer: Secondary | ICD-10-CM | POA: Diagnosis not present

## 2022-03-13 DIAGNOSIS — L97524 Non-pressure chronic ulcer of other part of left foot with necrosis of bone: Secondary | ICD-10-CM | POA: Diagnosis not present

## 2022-03-13 DIAGNOSIS — I2510 Atherosclerotic heart disease of native coronary artery without angina pectoris: Secondary | ICD-10-CM | POA: Diagnosis not present

## 2022-03-13 DIAGNOSIS — M86672 Other chronic osteomyelitis, left ankle and foot: Secondary | ICD-10-CM | POA: Diagnosis not present

## 2022-03-13 LAB — GLUCOSE, CAPILLARY
Glucose-Capillary: 167 mg/dL — ABNORMAL HIGH (ref 70–99)
Glucose-Capillary: 164 mg/dL — ABNORMAL HIGH (ref 70–99)

## 2022-03-13 NOTE — Progress Notes (Signed)
Unrein, [place] ([id number]) Visit Report for 03/13/2022 Arrival Information Details Patient Name: [name], [name]. Date of Service: 03/13/2022 8:00 AM Medical Record Number: [id number] Patient Account Number: [id number] Date of Birth/Sex: 01-19-1966 (56 y.o. M) Treating RN: [name] Primary Care Sava Proby: [name] Other Clinician: Jacqulyn Bath Referring Bauer Ausborn: [name] Treating Ernie Kasler/Extender: [name] in Treatment: 23 Visit Information History Since Last Visit Added or deleted any medications: No Patient Arrived: Ambulatory Any new allergies or adverse reactions: No Arrival Time: 08:00 Had a fall or experienced change in No Accompanied By: self activities of daily living that may affect Transfer Assistance: None risk of falls: Patient Identification Verified: Yes Signs or symptoms of abuse/neglect since last visito No Secondary Verification Process Completed: Yes Hospitalized since last visit: No Patient Requires Transmission-Based No Implantable device outside of the clinic excluding No Precautions: cellular tissue based products placed in the center Patient Has Alerts: Yes since last visit: Patient Alerts: Patient on Blood Has Dressing in Place as Prescribed: Yes Thinner Has Footwear/Offloading in Place as Prescribed: Yes Left: Other:Defender Boot; crutches Pain Present Now: No Electronic Signature(s) Signed: 03/13/2022 1:07:42 PM By: Enedina Finner RCP, RRT, CHT Entered By: Stark Jock, Amado Nash on 03/13/2022 08:24:55 [name], [name] ([id number]) -------------------------------------------------------------------------------- Encounter Discharge Information Details Patient Name: [name], [name] Date of Service: 03/13/2022 8:00 AM Medical Record Number: [id number] Patient Account Number: [id number] Date of Birth/Sex: [birth year] (56 y.o. M) Treating RN: [name] Primary Care Graves Nipp: [name] Other Clinician: Jacqulyn Bath Referring Matti Killingsworth: [name] Treating Atarah Cadogan/Extender: [name] in Treatment: 19 Encounter Discharge Information Items Discharge Condition: Stable Ambulatory Status: Ambulatory Discharge Destination: Home Transportation: Private Auto Accompanied By: self Schedule Follow-up Appointment: Yes Clinical Summary of Care: Notes Patient has an HBO treatment scheduled on 03/14/22 at 11:00 am. Electronic Signature(s) Signed: 03/13/2022 1:07:42 PM By: Enedina Finner RCP, RRT, CHT Entered By: Stark Jock, Amado Nash on 03/13/2022 13:07:21 [name], [name] ([id number]) -------------------------------------------------------------------------------- Vitals Details Patient Name: [name], [name] Date of Service: 03/13/2022 8:00 AM Medical Record Number: [id number] Patient Account Number: [id number] Date of Birth/Sex: [birth year] (56 y.o. M) Treating RN: [name] Primary Care Lolita Faulds: [name] Other Clinician: Jacqulyn Bath Referring Peachie Barkalow: [name] Treating Dotty Gonzalo/Extender: [name] in Treatment: 19 Vital Signs Time Taken: 08:08 Temperature (F): 97.6 Height (in): 70 Pulse (bpm): 78 Weight (lbs): 180.4 Respiratory Rate (breaths/min): 16 Body Mass Index (BMI): 25.9 Blood Pressure (mmHg): 128/70 Capillary Blood Glucose (mg/dl): 167 Reference Range: 80 - 120 mg / dl Electronic Signature(s) Signed: 03/13/2022 1:07:42 PM By: Enedina Finner RCP, RRT, CHT Entered By: Enedina Finner on 03/13/2022 08:27:11

## 2022-03-14 ENCOUNTER — Encounter: Payer: Medicare Other | Admitting: Physician Assistant

## 2022-03-14 DIAGNOSIS — I2510 Atherosclerotic heart disease of native coronary artery without angina pectoris: Secondary | ICD-10-CM | POA: Diagnosis not present

## 2022-03-14 DIAGNOSIS — L02612 Cutaneous abscess of left foot: Secondary | ICD-10-CM | POA: Diagnosis not present

## 2022-03-14 DIAGNOSIS — I5042 Chronic combined systolic (congestive) and diastolic (congestive) heart failure: Secondary | ICD-10-CM | POA: Diagnosis not present

## 2022-03-14 DIAGNOSIS — D631 Anemia in chronic kidney disease: Secondary | ICD-10-CM | POA: Diagnosis not present

## 2022-03-14 DIAGNOSIS — I96 Gangrene, not elsewhere classified: Secondary | ICD-10-CM | POA: Diagnosis not present

## 2022-03-14 DIAGNOSIS — I110 Hypertensive heart disease with heart failure: Secondary | ICD-10-CM | POA: Diagnosis not present

## 2022-03-14 DIAGNOSIS — N2581 Secondary hyperparathyroidism of renal origin: Secondary | ICD-10-CM | POA: Diagnosis not present

## 2022-03-14 DIAGNOSIS — M86672 Other chronic osteomyelitis, left ankle and foot: Secondary | ICD-10-CM | POA: Diagnosis not present

## 2022-03-14 DIAGNOSIS — Z992 Dependence on renal dialysis: Secondary | ICD-10-CM | POA: Diagnosis not present

## 2022-03-14 DIAGNOSIS — E11621 Type 2 diabetes mellitus with foot ulcer: Secondary | ICD-10-CM | POA: Diagnosis not present

## 2022-03-14 DIAGNOSIS — N186 End stage renal disease: Secondary | ICD-10-CM | POA: Diagnosis not present

## 2022-03-14 DIAGNOSIS — L97524 Non-pressure chronic ulcer of other part of left foot with necrosis of bone: Secondary | ICD-10-CM | POA: Diagnosis not present

## 2022-03-14 LAB — GLUCOSE, CAPILLARY
Glucose-Capillary: 171 mg/dL — ABNORMAL HIGH (ref 70–99)
Glucose-Capillary: 160 mg/dL — ABNORMAL HIGH (ref 70–99)

## 2022-03-14 NOTE — Progress Notes (Signed)
[place], [place] ([id number]) Visit Report for 03/14/2022 Problem List Details Patient Name: [name], [name]. Date of Service: 03/14/2022 11:00 AM Medical Record Number: [id number] Patient Account Number: [id number] Date of Birth/Sex: [birth year] (56 y.o. 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 Date of Service: 03/14/2022 Medical Record Number: [id number] Patient Account Number: [id number] Date of Birth/Sex: 1966/06/24 (56 y.o. 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 R26.89 Other abnormalities of gait and mobility Facility Procedures CPT4 Code: 67124580 Description: (Facility Use Only) HBOT, full body chamber, 65min Modifier: Quantity: 4 Physician Procedures CPT4 Code: 9983382 Description: 50539 - WC PHYS HYPERBARIC OXYGEN THERAPY Modifier: Quantity: 1 CPT4 Code: Description: ICD-10 Diagnosis Description M86.672 Other chronic osteomyelitis, 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

## 2022-03-14 NOTE — Progress Notes (Signed)
[place], [place] ([id number]) Visit Report for 03/14/2022 Arrival Information Details Patient Name: [name], [name]. Date of Service: 03/14/2022 11:00 AM Medical Record Number: [id number] Patient Account Number: [id number] Date of Birth/Sex: [birth year] (56 y.o. M) Treating RN: [name] Primary Care Jesiel Garate: [name] Other Clinician: Jacqulyn Bath Referring Liborio Saccente: [name] Treating Prisila Dlouhy/Extender: [name] in Treatment: 65 Visit Information History Since Last Visit Added or deleted any medications: No Patient Arrived: Ambulatory Any new allergies or adverse reactions: No Arrival Time: 10:00 Had a fall or experienced change in No Accompanied By: self activities of daily living that may affect Transfer Assistance: None risk of falls: Patient Identification Verified: Yes Signs or symptoms of abuse/neglect since last visito No Secondary Verification Process Completed: Yes Hospitalized since last visit: No Patient Requires Transmission-Based No Implantable device outside of the clinic excluding No Precautions: cellular tissue based products placed in the center Patient Has Alerts: Yes since last visit: Patient Alerts: Patient on Blood Has Dressing in Place as Prescribed: Yes Thinner Has Footwear/Offloading in Place as Prescribed: Yes Left: Other:Defender Boot; crutches Pain Present Now: No Electronic Signature(s) Signed: 03/14/2022 1:41:37 PM By: Enedina Finner RCP, RRT, CHT Entered By: Stark Jock, Amado Nash on 03/14/2022 12:48:17 [place], [place] L. ([id number]) -------------------------------------------------------------------------------- Encounter Discharge Information Details Patient Name: [name], [name] Date of Service: 03/14/2022 11:00 AM Medical Record Number: [id number] Patient Account Number: [id number] Date of Birth/Sex: Jun 10, 1966 (56 y.o. M) Treating RN: [name] Primary Care Latandra Loureiro: [name] Other Clinician:  Jacqulyn Bath Referring Retha Bither: [name] Treating Feras Gardella/Extender: [name] in Treatment: 19 Encounter Discharge Information Items Discharge Condition: Stable Ambulatory Status: Ambulatory Discharge Destination: Home Transportation: Private Auto Accompanied By: self Schedule Follow-up Appointment: Yes Clinical Summary of Care: Notes Patient has an HBO treatment scheduled on 03/18/22 at 08:00 am. Electronic Signature(s) Signed: 03/14/2022 1:41:37 PM By: Enedina Finner RCP, RRT, CHT Entered By: Stark Jock, Amado Nash on 03/14/2022 13:01:03 Buhrman, Jakaiden L. ([id number]) -------------------------------------------------------------------------------- Vitals Details Patient Name: [name], [name] Date of Service: 03/14/2022 11:00 AM Medical Record Number: [id number] Patient Account Number: [id number] Date of Birth/Sex: 22-Jul-1966 (56 y.o. M) Treating RN: [name] Primary Care Kaleena Corrow: [name] Other Clinician: Jacqulyn Bath Referring Ruger Saxer: [name] Treating Addylynn Balin/Extender: [name] in Treatment: 19 Vital Signs Time Taken: 10:07 Temperature (F): 98.2 Height (in): 70 Pulse (bpm): 72 Weight (lbs): 180.4 Respiratory Rate (breaths/min): 16 Body Mass Index (BMI): 25.9 Blood Pressure (mmHg): 128/72 Capillary Blood Glucose (mg/dl): 171 Reference Range: 80 - 120 mg / dl Electronic Signature(s) Signed: 03/14/2022 1:41:37 PM By: Enedina Finner RCP, RRT, CHT Entered By: Enedina Finner on 03/14/2022 12:54:14

## 2022-03-14 NOTE — Progress Notes (Signed)
[name], [name] ([id number]) Visit Report for 03/14/2022 HBO Details Patient Name: [name], [name] Date of Service: 03/14/2022 11:00 AM Medical Record Number: [id number] Patient Account Number: [id number] Date of Birth/Sex: 1965-11-11 (56 y.o. M) Treating RN: [name] Primary Care Mariane Burpee: [name] Other Clinician: Jacqulyn Bath Referring Kalina Morabito: [name] Treating Pearle Wandler/Extender: [name] in Treatment: 19 HBO Treatment Course Details Treatment Course Number: 1 Ordering Amory Zbikowski: Jeri Cos Total Treatments Ordered: 100 HBO Treatment Start Date: 11/04/2021 HBO Indication: Chronic Refractory Osteomyelitis to Left Foot HBO Treatment Details Treatment Number: 87 Patient Type: Outpatient Chamber Type: Monoplace Chamber Serial #: [id number] Treatment Protocol: 2.0 ATA with 90 minutes oxygen, and no air breaks Treatment Details Compression Rate Down: 1.0 psi / minute De-Compression Rate Up: 1.5 psi / minute Compress Tx Pressure Air breaks and breathing periods Decompress Decompress Begins Reached (leave unused spaces blank) Begins Ends Chamber Pressure (ATA) 1 2 - - - - - - 2 1 Clock Time (24 hr) 10:12 10:29 - - - - - - 11:59 12:09 Treatment Length: 117 (minutes) Treatment Segments: 4 Vital Signs Capillary Blood Glucose Reference Range: 80 - 120 mg / dl HBO Diabetic Blood Glucose Intervention Range: <131 mg/dl or >249 mg/dl Time Vitals Blood Respiratory Capillary Blood Glucose Pulse Action Type: Pulse: Temperature: Taken: Pressure: Rate: Glucose (mg/dl): Meter #: Oximetry (%) Taken: Pre 10:07 128/72 72 16 98.2 171 Post 12:20 124/72 78 16 98.4 160 1 none per protocol Treatment Response Treatment Toleration: Well Treatment Completion Treatment Completed without Adverse Event Status: HBO Attestation I certify that I supervised this HBO treatment in accordance with Medicare guidelines. A trained emergency response team is readily Yes available per  hospital policies and procedures. Continue HBOT as ordered. Yes Electronic Signature(s) Signed: 03/14/2022 4:54:37 PM By: Worthy Keeler PA-C Previous Signature: 03/14/2022 1:41:37 PM Version By: Enedina Finner RCP, RRT, CHT Entered By: Worthy Keeler on 03/14/2022 16:54:37 [place], [place] ([id number]) -------------------------------------------------------------------------------- HBO Safety Checklist Details Patient Name: [name], [name] Date of Service: 03/14/2022 11:00 AM Medical Record Number: [id number] Patient Account Number: [id number] Date of Birth/Sex: Jul 31, 1966 (56 y.o. M) Treating RN: [name] Primary Care Harlin Mazzoni: [name] Other Clinician: Jacqulyn Bath Referring Bibiana Gillean: [name] Treating Crystol Walpole/Extender: [name] in Treatment: 19 HBO Safety Checklist Items Safety Checklist Consent Form Signed Patient voided / foley secured and emptied When did you last eato 09:30 am Last dose of injectable or oral agent n/a Ostomy pouch emptied and vented if applicable NA All implantable devices assessed, documented and approved NA Intravenous access site secured and place NA Valuables secured Linens and cotton and cotton/polyester blend (less than 51% polyester) Personal oil-based products / skin lotions / body lotions removed Wigs or hairpieces removed NA Smoking or tobacco materials removed NA Books / newspapers / magazines / loose paper removed NA Cologne, aftershave, perfume and deodorant removed Jewelry removed (may wrap wedding band) Make-up removed NA Hair care products removed Battery operated devices (external) removed NA Heating patches and chemical warmers removed NA Titanium eyewear removed NA Nail polish cured greater than 10 hours NA Casting material cured greater than 10 hours NA Hearing aids removed NA Loose dentures or partials removed NA Prosthetics have been removed NA Patient demonstrates correct use  of air break device (if applicable) Patient concerns have been addressed Patient grounding bracelet on and cord attached to chamber Specifics for Inpatients (complete in addition to above) Medication sheet sent with patient Intravenous medications needed or due during therapy sent  with patient Drainage tubes (e.g. nasogastric tube or chest tube secured and vented) Endotracheal or Tracheotomy tube secured Cuff deflated of air and inflated with saline Airway suctioned Electronic Signature(s) Signed: 03/14/2022 1:41:37 PM By: Enedina Finner RCP, RRT, CHT Entered By: Enedina Finner on 03/14/2022 12:56:01

## 2022-03-14 NOTE — Progress Notes (Signed)
[name], [name] ([id number]) Visit Report for 03/13/2022 HBO Details Patient Name: [name], [name] Date of Service: 03/13/2022 8:00 AM Medical Record Number: [id number] Patient Account Number: [id number] Date of Birth/Sex: 02-19-1966 (56 y.o. M) Treating RN: [name] Primary Care Odel Schmid: [name] Other Clinician: Jacqulyn Bath Referring Keyanna Sandefer: [name] Treating Paisly Fingerhut/Extender: [name] in Treatment: 19 HBO Treatment Course Details Treatment Course Number: 1 Ordering Santiago Graf: Jeri Cos Total Treatments Ordered: 100 HBO Treatment Start Date: 11/04/2021 HBO Indication: Chronic Refractory Osteomyelitis to Left Foot HBO Treatment Details Treatment Number: 86 Patient Type: Outpatient Chamber Type: Monoplace Chamber Serial #: [id number] Treatment Protocol: 2.0 ATA with 90 minutes oxygen, and no air breaks Treatment Details Compression Rate Down: 1.0 psi / minute De-Compression Rate Up: 1.5 psi / minute Compress Tx Pressure Air breaks and breathing periods Decompress Decompress Begins Reached (leave unused spaces blank) Begins Ends Chamber Pressure (ATA) 1 2 - - - - - - 2 1 Clock Time (24 hr) 08:17 08:34 - - - - - - 10:04 10:15 Treatment Length: 118 (minutes) Treatment Segments: 4 Vital Signs Capillary Blood Glucose Reference Range: 80 - 120 mg / dl HBO Diabetic Blood Glucose Intervention Range: <131 mg/dl or >249 mg/dl Time Vitals Blood Respiratory Capillary Blood Glucose Pulse Action Type: Pulse: Temperature: Taken: Pressure: Rate: Glucose (mg/dl): Meter #: Oximetry (%) Taken: Pre 08:08 128/70 78 16 97.6 167 1 none per protocol Post 10:27 130/68 72 16 97.9 164 1 none per protocol Treatment Response Treatment Toleration: Well Treatment Completion Treatment Completed without Adverse Event Status: Electronic Signature(s) Signed: 03/13/2022 1:07:42 PM By: Zackery Barefoot, RRT, CHT Signed: 03/14/2022 4:54:58 PM By: Worthy Keeler  PA-C Entered By: Stark Jock, Amado Nash on 03/13/2022 13:05:48 [name], [name] ([id number]) -------------------------------------------------------------------------------- HBO Safety Checklist Details Patient Name: [name], [name] Date of Service: 03/13/2022 8:00 AM Medical Record Number: [id number] Patient Account Number: [id number] Date of Birth/Sex: 03-27-1966 (56 y.o. M) Treating RN: [name] Primary Care Damilola Flamm: [name] Other Clinician: Jacqulyn Bath Referring Amanada Philbrick: [name] Treating Demari Kropp/Extender: [name] in Treatment: 19 HBO Safety Checklist Items Safety Checklist Consent Form Signed Patient voided / foley secured and emptied When did you last eato 07:00 am Last dose of injectable or oral agent n/a Ostomy pouch emptied and vented if applicable NA All implantable devices assessed, documented and approved NA Intravenous access site secured and place NA Valuables secured Linens and cotton and cotton/polyester blend (less than 51% polyester) Personal oil-based products / skin lotions / body lotions removed Wigs or hairpieces removed NA Smoking or tobacco materials removed NA Books / newspapers / magazines / loose paper removed NA Cologne, aftershave, perfume and deodorant removed Jewelry removed (may wrap wedding band) Make-up removed NA Hair care products removed Battery operated devices (external) removed NA Heating patches and chemical warmers removed NA Titanium eyewear removed NA Nail polish cured greater than 10 hours NA Casting material cured greater than 10 hours NA Hearing aids removed NA Loose dentures or partials removed NA Prosthetics have been removed NA Patient demonstrates correct use of air break device (if applicable) Patient concerns have been addressed Patient grounding bracelet on and cord attached to chamber Specifics for Inpatients (complete in addition to above) Medication sheet sent with  patient Intravenous medications needed or due during therapy sent with patient Drainage tubes (e.g. nasogastric tube or chest tube secured and vented) Endotracheal or Tracheotomy tube secured Cuff deflated of air and inflated with saline Airway suctioned Electronic Signature(s) Signed: 03/13/2022  1:07:42 PM By: Enedina Finner RCP, RRT, CHT Entered By: Enedina Finner on 03/13/2022 08:28:19

## 2022-03-14 NOTE — Progress Notes (Addendum)
Snook, [place] ([id number]) Visit Report for 03/14/2022 Chief Complaint Document Details Patient Name: [name], [name]. Date of Service: 03/14/2022 1:15 PM Medical Record Number: [id number] Patient Account Number: [id number] Date of Birth/Sex: 02-Sep-1966 (56 y.o. M) Treating RN: [name] Primary Care Provider: [name] Other Clinician: Referring Provider: [name] Treating Provider/Extender: [name] in Treatment: 19 Information Obtained from: Patient Chief Complaint HBO evaluation for treatment on the left foot diabetic ulcer with osteomyelitis. Patient is currently under our care as well for his foot. Electronic Signature(s) Signed: 03/14/2022 12:55:08 PM By: Worthy Keeler PA-C Entered By: Worthy Keeler on 03/14/2022 12:55:08 Wolfgang, Prynce L. ([id number]) -------------------------------------------------------------------------------- HPI Details Patient Name: [name], [name] Date of Service: 03/14/2022 1:15 PM Medical Record Number: [id number] Patient Account Number: [id number] Date of Birth/Sex: [birth year] (56 y.o. M) Treating RN: [name] Primary Care Provider: [name] Other Clinician: Referring Provider: [name] Treating Provider/Extender: [name] in Treatment: 19 History of Present Illness HPI Description: 10/31/2021 patient presents today for evaluation here in our clinic. He is actually being evaluated for hyperbaric oxygen therapy only. He has been seen at [facility] up to this point he is under the care of the vascular surgery specialty. Subsequently he is also under the care of the hyperbaric center there. With that being said that due to the fact that he is a dialysis patient he was actually recommended to be transferred to [place] for his treatments he actually lives in [place] which is where his dialysis is. It was a hardship for him to try to get from [place] especially on dialysis days all the way to [facility]  get into the facility for his treatment and get back home he was literally spending all day long. He has had just 4 treatments there at [facility] thus far based on what I see in these have not been continued while he awaits approval here in our clinic. Subsequently I do have records for review that will be included in the HPI predetermination review and attached to this chart as well. I will not duplicate that here. 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 Nonetheless he continues to have exposed bone with marked soft tissue swelling compatible with osteomyelitis. This is due to the irregularity of the head of the metatarsal as well. Nonetheless along with having associated cellulitis he is also good to be seen by infectious disease and is currently on antibiotics for this as well. Again that will be detailed in the pretreatment review section which I will attach to this note as well. He does currently have a wound VAC in place and again the wound was not evaluated here in the clinic as that is being completely managed by home health and [facility]. The patient does have a history of diabetes mellitus type 2, hypertension, coronary artery disease, and congestive heart failure. He also has cataracts of both eyes but no evidence of glaucoma on his most recent eye exam. 11/22/2021 we have been seeing this gentleman for hyperbaric oxygen therapy but have not actually evaluated his wound up to this point this was being managed by the wound care and vascular team I presumed at [facility] at least that is what has been told to be previous. Nonetheless at this point I got a call from Atlee Abide who is  a physician assistant at the [facility] vascular clinic with Dr. Durene Fruits and subsequently they were wanting to know if I could take over wound care for this patient.  Apparently they do not have the ability for an office debridement which again I completely understand but nonetheless definitely is not an integral part of his healing along with the hyperbarics. Subsequently I discussed with the patient today that I do believe he would benefit going ahead and proceeding with evaluation here in the clinic for that reason I did go ahead and see him today to get things started. There is also been some confusion about the issues with his home health nurse and getting measurements to [facility] for the wound VAC in order to continue his wound VAC therapy. With that being said after I see him today we will make a determination on what to do with wound VAC we can definitely send measurements to [facility] but again the bigger question is whether this is the best way to go or not. 11/28/2021 upon evaluation today patient appears to be doing decently well in regard to his wound. He has been tolerating the dressing changes with the Dakin's moistened gauze dressing which I think is doing a good job. Fortunately I do not see any signs of active infection locally nor systemically at this point. In general I think that he is actually making excellent progress which is great news. 2/17; seen in conjunction with HBO today. 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 Much improved condition of the wound surface. On the lateral foot and eschared area that he has been using Betadine. Tolerating HBO well 12/13/2021 upon evaluation today patient appears to be doing pretty well in regard to his foot ulcer. I am actually much more pleased currently that I been with the way the appearance looks today. The wound bed does show some signs of slough and biofilm on the surface of the wound this is minimal I Minna perform debridement the I will do so very carefully due to the fact that to be honest the patient had a significant amount of bleeding in the past being on the Brilinta  he had a difficult time clotting when I debrided him previously quite significantly. Fortunately I do not see any signs of active infection at this time which is great news. No fevers, chills, nausea, vomiting, or diarrhea. 12/20/2021 upon evaluation today patient actually appears to be showing some signs of excellent improvement here. I am very pleased with where things stand currently. There does not appear to be any evidence of active infection locally nor systemically which is great news and I do believe that with the hyperbaric oxygen therapy he is actually making excellent progress. 3/9; patient presents for follow-up. He continues to do HBO therapy without issues. He has completed 34 out of 40 sessions. He continues to use Dakin's wet-to-dry to the amputation site and Betadine to the lateral foot wound. he currently denies signs of infection. 01/03/2022 upon evaluation today patient appears to be doing better in regard to his distal foot ulcer. Unfortunately the lateral foot is a different story this area has softened up and is getting need to be debrided away today. I Georgina Peer do this with scissors and forceps and was able to clear that out that will be detailed below. Otherwise I feel like the Dakin's moistened gauze dressing is doing an awesome job for him and he is making significantly improvements here. 01/10/2022 upon  evaluation today patient continues to have some of the necrotic tissue loosen up on the lateral portion of his foot the main wound that we have been treating is actually doing quite well. Overall very pleased with where things stand in general. No fevers, chills, nausea, vomiting, or diarrhea. 01/17/2022 upon evaluation today patient appears to be doing somewhat poorly in regard to the lateral foot wound. The main wound that we initially were seeing him for looks to be doing great. This lateral foot has worsened and unfortunately it appears to potentially have some signs  of infection here. I do believe that we may need to see about getting started on some IV antibiotics he is in dialysis so they could potentially do this for him, Monday. Nonetheless I want to try to get that started ASAP. Again I would prefer him be initiated with IV vancomycin and cefepime just as an empiric broad-spectrum until we can get the culture results back at least. Subsequently also think that this should be done sooner rather than later due to the fact that his leg is more swollen and overall this seems to have progressed fairly rapidly since last week I do not want this to get worse. He voiced understanding. 01-24-2022 upon evaluation today patient appears to be doing well with regard to his main ulcer that we have been seeing him for. I am very pleased with where things stand currently. He still continues to have bleeding if we do any type of aggressive sharp debridement there for him Madonna, Yuya L. ([id number]) having to be very careful using Dakin's solution along with debridement as I can here in the office. Overall the patient seems to be doing well. Unfortunately the lateral foot wound still has progressed a little bit further. We did get him on antibiotics starting this past Monday I think the drainage looks much better and overall I am seeing improvement but again this was already broken down to a certain degree I am hopeful by next week this will really start turnaround for [place]. We will also get a send him for an x-ray to evaluate for any signs of osteomyelitis on this lateral foot. 01-31-2022 upon evaluation today patient's wound on the medial left amputation site is actually doing excellent. I am very pleased with where things stand this is significantly smaller and we are headed in the right direction. In regard to the left lateral foot there is still some necrotic tissue to be cleared away although I see evidence of this starting to basically solidify as far as the edges are  concerned with how far it spread I do not see anything seeming to want to spread further. I believe this may be close to done as far as any evolving is concerned. 02-07-2022 upon evaluation today patient appears to be doing well with regard to the main amputation site. Unfortunately the secondary site on the left lateral foot is still giving him some trouble here. There is a lot of dried tissue and I do believe that along with the Dakin's we may want to use a contact layer in the base of the wound to try to prevent the bone from drying out because we are down to that point at this time. The patient voiced understanding at this point as well and is happy to do what ever it takes to try to get this better. With that being said I do believe that it is possible he may benefit from a wound VAC. Again the question  is the approval process for getting this done obviously we can see what we can do if we get things cleaned a little bit better but right now I think the best option is going to be for [place] to continue with the packing currently. 02-14-2022 upon evaluation today patient presents for follow-up concerning ongoing issues with his foot. He did have the MRI which does show that he has evidence of osteomyelitis of the digit at the fifth location of his foot. Subsequently this does extend down to the base of the digit and into the proximal portion of the metatarsal as well. With that being said the bone does not appear to be too severely broken down and a lot of the marrow signal is stated to be maintained which is good news. Nonetheless I do believe that he is unfortunately still in a spot where this is precarious and we need to make sure that we are treating this appropriately. He does seem to be doing well with IV antibiotics he also seems to be doing quite well when it comes to the overall appearance of the wound bed at this point. I am happy in that regard. 02-21-2022 upon evaluation today patient appears  to be doing well all things considered in regard to his wounds. I do feel like that he is making some progress here. The wound on the right distal foot where the amputation site is is actually healing nicely I feel like we can switch over to collagen at this point that should do well for him. In regard to the left lateral foot this is actually getting much cleaner he still has some area where there seems to be some pressure getting to the location I think that he would do well with a knee scooter at this point. I discussed that with him he does not seem prone to going that route at this point he understands it can definitely help. 02-28-2022 upon evaluation today patient appears to be doing well currently with regard to the wound on his foot. The main surgical wound is actually significantly improved and the lateral wound is doing better there is still bone exposed but nonetheless this does not appear to be significantly worse than what we have noted previous. In fact I think it looks better than it did last week. My goal is to keep the bone from drying out and subsequently for that reason we are going to go ahead and proceed with the EpiFix which I think is good to be a good option here for him. Over top of the EpiFix we will do a contact layer in order to hopefully keep things from drying out. This dressing will actually stay in place until he comes back. Patient did see vascular yesterday and states that they did not feel like anything was doing worse still it appears that if it were left up to them they think the ideal thing would be to proceed with an amputation. I explained to the patient that to be honest is not always avoidable but if we can try to avoid amputation that is what we are after. He voiced understanding and he does want to do what ever he can to try to prevent amputation for that reason we are proceeding with the epi cord today. This will be application #1. [id number] upon evaluation  today patient appears to be making some progress here regard to the lateral foot. Fortunately there does not appear to be any signs of active infection locally  or systemically which is great news. No fevers, chills, nausea, vomiting, or diarrhea. I do think that he is appropriate for second application of EpiCord today. 03-14-2022 upon evaluation today patient's wound still has significant exposure of the bone at this point. He did get crutches he has been trying to use those the past couple of days am not sure that is going so well to be perfectly honest. We did discuss however offloading is important and we even discussed a total contact cast but to be perfectly honest right now and feels like he may need more debridement to clear away some of the bone which is not viable in the end of the foot of the fifth metatarsal but I am unable to really do that here in the office due to his bleeding. I do not know if potentially we could have him seen by podiatry potentially Dr. Merril Abbe to see if there is anything he can do to help in this regard. I am not even certain whether the majority of the metatarsal is even viable to be honest. Either way I do think that he would benefit from surgical debridement or even possibly partial ray amputation in order for [place] to try to keep things moving in the right direction and get this closed. This was all discussed with the patient today. With that being said I know he wants to avoid a below-knee amputation which is what has previously been recommended. Electronic Signature(s) Signed: 03/14/2022 4:51:39 PM By: Worthy Keeler PA-C Entered By: Worthy Keeler on 03/14/2022 16:51:38 Mancinas, Burleigh L. ([id number]) -------------------------------------------------------------------------------- Physical Exam Details Patient Name: Civello, Davin L. Date of Service: 03/14/2022 1:15 PM Medical Record Number: [id number] Patient Account Number: [id number] Date of Birth/Sex:  Jun 18, 1966 (56 y.o. M) Treating RN: [name] Primary Care Provider: [name] Other Clinician: Referring Provider: [name] Treating Provider/Extender: [name] in Treatment: 50 Constitutional Well-nourished and well-hydrated in no acute distress. Respiratory normal breathing without difficulty. Psychiatric this patient is able to make decisions and demonstrates good insight into disease process. Alert and Oriented x 3. pleasant and cooperative. Notes Upon inspection patient's wound bed actually showed signs of good granulation and the medial portion of the wound which is doing awesome. Without being said on the lateral portion were struggling here and I do believe part of the issue was getting rid of some of the necrotic bone from the infection. I believe his infection is probably cleared he looks much better in that regard and he was on the antibiotics for solid 6 weeks. He also has been doing hyperbarics which I think is also helped in that regard. Nonetheless were still having trouble getting this to completely close due to the fact that we do not really have any good tissue over top of this bone and there is still some necrotic bone noted that I just cannot get off due to the bleeding that occurs. Electronic Signature(s) Signed: 03/14/2022 4:52:21 PM By: Worthy Keeler PA-C Entered By: Worthy Keeler on 03/14/2022 16:52:21 Asfour, Barrington ([id number]) -------------------------------------------------------------------------------- Physician Orders Details Patient Name: Summerville, Gamble L. Date of Service: 03/14/2022 1:15 PM Medical Record Number: [id number] Patient Account Number: [id number] Date of Birth/Sex: 1966-09-08 (56 y.o. 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 - provider visits at wound center o Return Appointment in: - HBO- (daily Monday -Friday for HBO Treatments) [place] for wound care. May utilize formulary equivalent dressing for wound treatment orders unless otherwise specified. Home Health Nurse may visit PRN to address patientos wound care needs. o Scheduled days for dressing changes to be completed; exception, patient has scheduled wound care visit that day. o **Please direct any NON-WOUND related issues/requests for orders to patient's Primary Care Physician. **If current dressing causes regression in wound condition, may D/C ordered dressing product/s and apply Normal Saline Moist Dressing daily until next [place] or Other MD appointment. **Notify Wound Healing Center of regression in wound condition at [phone]. Bathing/ Shower/ Hygiene o Clean wound with Normal Saline or wound cleanser. o May shower with wound dressing protected with water repellent cover or cast protector. o No tub bath. Cellular or Tissue Based Products Wound #2 Left,Lateral Foot o Cellular or Tissue Based Product Type: - epifix #3 o Cellular or Tissue Based Product applied to wound bed; including contact layer, fixation with steri-strips, dry gauze and cover dressing. (DO NOT REMOVE). Edema Control - Lymphedema / Segmental Compressive Device /  Other o Elevate, Exercise Daily and Avoid Standing for Long Periods of Time. o Elevate legs to the level of the heart and pump ankles as often as possible o Elevate leg(s) parallel to the floor when sitting. Off-Loading Left Lower Extremity o Foot Defender o - size large Additional Orders / Instructions o Follow Nutritious Diet and Increase Protein Intake o Other: - Follow vascular, nephrology and PCP for wound care and other needs Dialysis appt as scheduled M-W-F Hyperbaric Oxygen Therapy o Indication and location: - Left 1st and 2nd Ray Amputation osteomyelitis and left lateral foot osteomyelitis. I added treatments onto the original regimen to account for the lateral foot osteomyelitis. o If appropriate for treatment, begin HBOT per protocol: o 2.0 ATA for 90 Minutes without Air Breaks o One treatment per day (delivered Monday through Friday unless otherwise specified in Special Instructions below): Eno, Costilla ([id number]) o Total # of Treatments: - 100 o Finger stick Blood Glucose Pre- and Post- HBOT Treatment. o Follow Hyperbaric Oxygen Glycemia Protocol Wound Treatment Wound #1 - Amputation Site - Digit Wound Laterality: Left Cleanser: Wound Cleanser 3 x Per Week/30 Days Discharge Instructions: Wash your hands with soap and water. Remove old dressing, discard into plastic bag and place into trash. Cleanse the wound with Wound Cleanser prior to applying a clean dressing using gauze sponges, not tissues or cotton balls. Do not scrub or use excessive force. Pat dry using gauze sponges, not tissue or cotton balls. Primary Dressing: Prisma 4.34 (in) 3 x Per Week/30 Days Discharge Instructions: Moisten w/normal saline or sterile water; Cover wound as directed. Do not remove from wound bed. Secured With: Medipore Tape - 58M Medipore H Soft Cloth Surgical Tape, 2x2 (in/yd) 3 x Per Week/30 Days Secured With: [employer] Sterile or Non-Sterile 6-ply 4.5x4  (yd/yd) 3 x Per Week/30 Days Discharge Instructions: Apply Kerlix as directed Secured With: ABD pad 3 x  Per Week/30 Days Wound #2 - Foot Wound Laterality: Left, Lateral Cleanser: Wound Cleanser 3 x Per Week/30 Days Discharge Instructions: Wash your hands with soap and water. Remove old dressing, discard into plastic bag and place into trash. Cleanse the wound with Wound Cleanser prior to applying a clean dressing using gauze sponges, not tissues or cotton balls. Do not scrub or use excessive force. Pat dry using gauze sponges, not tissue or cotton balls. Primary Dressing: Xeroform 4x4-HBD (in/in) 3 x Per Week/30 Days Discharge Instructions: Apply Xeroform 4x4-HBD (in/in) as directed Secondary Dressing: ABD Pad 5x9 (in/in) 3 x Per Week/30 Days Discharge Instructions: Cover with ABD pad Secondary Dressing: Kerlix 4.5 x 4.1 (in/yd) 3 x Per Week/30 Days Discharge Instructions: Apply Kerlix 4.5 x 4.1 (in/yd) as instructed Secured With: Medipore Tape - 49M Medipore H Soft Cloth Surgical Tape, 2x2 (in/yd) 3 x Per Week/30 Days Consults o Podiatry - [place] , Dr Amalia Hailey , surgical debridement - (ICD10 E11.621 - Type 2 diabetes mellitus with foot ulcer) GLYCEMIA INTERVENTIONS PROTOCOL PRE-HBO GLYCEMIA INTERVENTIONS ACTION INTERVENTION Obtain pre-HBO capillary blood glucose (ensure 1 physician order is in chart). A. Notify HBO physician and await physician orders. 2 If result is 70 mg/dl or below: B. If the result meets the hospital definition of a critical result, follow hospital policy. A. Give patient an 8 ounce Glucerna Shake, an 8 ounce Ensure, or 8 ounces of a Glucerna/Ensure equivalent dietary supplement*. B. Wait 30 minutes. If result is 71 mg/dl to 130 mg/dl: C. Retest patientos capillary blood glucose (CBG). D. If result greater than or equal to 110 mg/dl, proceed with HBO. If result less than 110 mg/dl, notify HBO physician and consider holding HBO. If result is  131 mg/dl to 249 mg/dl: A. Proceed with HBO. A. Notify HBO physician and await physician orders. B. It is recommended to hold HBO and do If result is 250 mg/dl or greater: blood/urine ketone testing. C. If the result meets the hospital definition of a critical result, follow hospital policy. Nabers, [place] ([id number]) POST-HBO GLYCEMIA INTERVENTIONS ACTION INTERVENTION Obtain post HBO capillary blood glucose (ensure 1 physician order is in chart). A. Notify HBO physician and await physician orders. 2 If result is 70 mg/dl or below: B. If the result meets the hospital definition of a critical result, follow hospital policy. A. Give patient an 8 ounce Glucerna Shake, an 8 ounce Ensure, or 8 ounces of a Glucerna/Ensure equivalent dietary supplement*. B. Wait 15 minutes for symptoms of hypoglycemia (i.e. nervousness, anxiety, If result is 71 mg/dl to 100 mg/dl: sweating, chills, clamminess, irritability, confusion, tachycardia or dizziness). C. If patient asymptomatic, discharge patient. If patient symptomatic, repeat capillary blood glucose (CBG) and notify HBO physician. If result is 101 mg/dl to 249 mg/dl: A. Discharge patient. A. Notify HBO physician and await physician orders. B. It is recommended to do blood/urine If result is 250 mg/dl or greater: ketone testing. C. If the result meets the hospital definition of a critical result, follow hospital policy. *Juice or candies are NOT equivalent products. If patient refuses the Glucerna or Ensure, please consult the hospital dietitian for an appropriate substitute. Electronic Signature(s) Signed: 03/14/2022 4:19:47 PM By: [name] RN Signed: 03/14/2022 4:54:58 PM By: Worthy Keeler PA-C Entered By: [name] on 03/14/2022 13:17:52 Mcmillen, Atharva L. ([id number]) -------------------------------------------------------------------------------- Problem List Details Patient Name: Gudino, Carey L. Date of Service:  03/14/2022 1:15 PM Medical Record Number: [id number] Patient Account Number: [id number] Date of Birth/Sex: 1966/04/21 (56 y.o.  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/31/2021 No Yes bone M86.672 Other chronic osteomyelitis, left ankle and foot 10/31/2021 No Yes I10 Essential (primary) hypertension 10/31/2021 No Yes I25.10 Atherosclerotic heart disease of native coronary artery without angina 10/31/2021 No Yes pectoris I50.42 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 Date of Service: 03/14/2022 1:15 PM Medical Record Number: [id number] Patient Account Number: [id number] Date of Birth/Sex: July 14, 1966 (56 y.o. M) Treating RN: [name] Primary Care Provider: [name] Other Clinician: Referring Provider: [name] Treating Provider/Extender: [name] in Treatment: 19 Subjective Chief Complaint Information obtained from Patient HBO evaluation for treatment on the left foot diabetic ulcer with osteomyelitis. Patient is currently under our care as well for his foot. History of Present Illness (HPI) 10/31/2021 patient presents today for evaluation here in our clinic. He is  actually being evaluated for hyperbaric oxygen therapy only. He has been seen at [facility] up to this point he is under the care of the vascular surgery specialty. Subsequently he is also under the care of the hyperbaric center there. With that being said that due to the fact that he is a dialysis patient he was actually recommended to be transferred to [place] for his treatments he actually lives in [place] which is where his dialysis is. It was a hardship for him to try to get from [place] especially on dialysis days all the way to [facility] get into the facility for his treatment and get back home he was literally spending all day long. He has had just 4 treatments there at [facility] thus far based on what I see in these have not been continued while he awaits approval here in our clinic. Subsequently I do have records for review that will be included in the HPI predetermination review and attached to this chart as well. I will not duplicate that here. 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 Nonetheless he continues to have exposed bone with marked soft tissue swelling compatible with osteomyelitis. This is due to the irregularity of the head of the metatarsal as well. Nonetheless along with having associated cellulitis he is also good to be seen by infectious disease and is currently on antibiotics for this as well. Again that will be detailed in the pretreatment review section which I will attach to this note as well. He does currently have a wound VAC in place and again the wound was not evaluated here in the clinic as that is being completely managed by home health and [facility]. The patient does have a history of diabetes mellitus type 2, hypertension, coronary artery disease, and congestive heart failure. He also has cataracts  of both eyes but no evidence of glaucoma on his most recent eye  exam. 11/22/2021 we have been seeing this gentleman for hyperbaric oxygen therapy but have not actually evaluated his wound up to this point this was being managed by the wound care and vascular team I presumed at [place] at least that is what has been told to be previous. Nonetheless at this point I got a call from Atlee Abide who is a Librarian, academic at the [facility] vascular clinic with Dr. Durene Fruits and subsequently they were wanting to know if I could take over wound care for this patient. Apparently they do not have the ability for an office debridement which again I completely understand but nonetheless definitely is not an integral part of his healing along with the hyperbarics. Subsequently I discussed with the patient today that I do believe he would benefit going ahead and proceeding with evaluation here in the clinic for that reason I did go ahead and see him today to get things started. There is also been some confusion about the issues with his home health nurse and getting measurements to [facility] for the wound VAC in order to continue his wound VAC therapy. With that being said after I see him today we will make a determination on what to do with wound VAC we can definitely send measurements to [facility] but again the bigger question is whether this is the best way to go or not. 11/28/2021 upon evaluation today patient appears to be doing decently well in regard to his wound. He has been tolerating the dressing changes with the Dakin's moistened gauze dressing which I think is doing a good job. Fortunately I do not see any signs of active infection locally nor systemically at this point. In general I think that he is actually making excellent progress which is great news. 2/17; seen in conjunction with HBO today. 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 Much improved condition of the wound surface. On the  lateral foot and eschared area that he has been using Betadine. Tolerating HBO well 12/13/2021 upon evaluation today patient appears to be doing pretty well in regard to his foot ulcer. I am actually much more pleased currently that I been with the way the appearance looks today. The wound bed does show some signs of slough and biofilm on the surface of the wound this is minimal I Minna perform debridement the I will do so very carefully due to the fact that to be honest the patient had a significant amount of bleeding in the past being on the Brilinta he had a difficult time clotting when I debrided him previously quite significantly. Fortunately I do not see any signs of active infection at this time which is great news. No fevers, chills, nausea, vomiting, or diarrhea. 12/20/2021 upon evaluation today patient actually appears to be showing some signs of excellent improvement here. I am very pleased with where things stand currently. There does not appear to be any evidence of active infection locally nor systemically which is great news and I do believe that with the hyperbaric oxygen therapy he is actually making excellent progress. 3/9; patient presents for follow-up. He continues to do HBO therapy without issues. He has completed 34 out of 40 sessions. He continues to use Dakin's wet-to-dry to the amputation site and Betadine to the lateral foot wound. he currently denies signs of infection. 01/03/2022 upon evaluation today patient appears to be doing better in regard to his distal foot ulcer. Unfortunately the lateral foot is a  different story this area has softened up and is getting need to be debrided away today. I Georgina Peer do this with scissors and forceps and was able to clear that out that will be detailed below. Otherwise I feel like the Dakin's moistened gauze dressing is doing an awesome job for him and he is making significantly improvements here. 01/10/2022 upon evaluation today patient  continues to have some of the necrotic tissue loosen up on the lateral portion of his foot the main wound that we have been treating is actually doing quite well. Overall very pleased with where things stand in general. No fevers, chills, nausea, vomiting, or diarrhea. 01/17/2022 upon evaluation today patient appears to be doing somewhat poorly in regard to the lateral foot wound. The main wound that we initially were seeing him for looks to be doing great. This lateral foot has worsened and unfortunately it appears to potentially have some signs of infection here. I do believe that we may need to see about getting started on some IV antibiotics he is in dialysis so they could potentially do this for him, Monday. Nonetheless I want to try to get that started ASAP. Again I would prefer him be initiated with IV vancomycin and cefepime just as an empiric broad-spectrum until we can get the culture results back at least. Subsequently also think that this should be done sooner rather [place], [place] ([id number]) than later due to the fact that his leg is more swollen and overall this seems to have progressed fairly rapidly since last week I do not want this to get worse. He voiced understanding. 01-24-2022 upon evaluation today patient appears to be doing well with regard to his main ulcer that we have been seeing him for. I am very pleased with where things stand currently. He still continues to have bleeding if we do any type of aggressive sharp debridement there for him having to be very careful using Dakin's solution along with debridement as I can here in the office. Overall the patient seems to be doing well. Unfortunately the lateral foot wound still has progressed a little bit further. We did get him on antibiotics starting this past Monday I think the drainage looks much better and overall I am seeing improvement but again this was already broken down to a certain degree I am hopeful by next week  this will really start turnaround for [place]. We will also get a send him for an x-ray to evaluate for any signs of osteomyelitis on this lateral foot. 01-31-2022 upon evaluation today patient's wound on the medial left amputation site is actually doing excellent. I am very pleased with where things stand this is significantly smaller and we are headed in the right direction. In regard to the left lateral foot there is still some necrotic tissue to be cleared away although I see evidence of this starting to basically solidify as far as the edges are concerned with how far it spread I do not see anything seeming to want to spread further. I believe this may be close to done as far as any evolving is concerned. 02-07-2022 upon evaluation today patient appears to be doing well with regard to the main amputation site. Unfortunately the secondary site on the left lateral foot is still giving him some trouble here. There is a lot of dried tissue and I do believe that along with the Dakin's we may want to use a contact layer in the base of the wound to try to  prevent the bone from drying out because we are down to that point at this time. The patient voiced understanding at this point as well and is happy to do what ever it takes to try to get this better. With that being said I do believe that it is possible he may benefit from a wound VAC. Again the question is the approval process for getting this done obviously we can see what we can do if we get things cleaned a little bit better but right now I think the best option is going to be for [place] to continue with the packing currently. 02-14-2022 upon evaluation today patient presents for follow-up concerning ongoing issues with his foot. He did have the MRI which does show that he has evidence of osteomyelitis of the digit at the fifth location of his foot. Subsequently this does extend down to the base of the digit and into the proximal portion of the metatarsal as  well. With that being said the bone does not appear to be too severely broken down and a lot of the marrow signal is stated to be maintained which is good news. Nonetheless I do believe that he is unfortunately still in a spot where this is precarious and we need to make sure that we are treating this appropriately. He does seem to be doing well with IV antibiotics he also seems to be doing quite well when it comes to the overall appearance of the wound bed at this point. I am happy in that regard. 02-21-2022 upon evaluation today patient appears to be doing well all things considered in regard to his wounds. I do feel like that he is making some progress here. The wound on the right distal foot where the amputation site is is actually healing nicely I feel like we can switch over to collagen at this point that should do well for him. In regard to the left lateral foot this is actually getting much cleaner he still has some area where there seems to be some pressure getting to the location I think that he would do well with a knee scooter at this point. I discussed that with him he does not seem prone to going that route at this point he understands it can definitely help. 02-28-2022 upon evaluation today patient appears to be doing well currently with regard to the wound on his foot. The main surgical wound is actually significantly improved and the lateral wound is doing better there is still bone exposed but nonetheless this does not appear to be significantly worse than what we have noted previous. In fact I think it looks better than it did last week. My goal is to keep the bone from drying out and subsequently for that reason we are going to go ahead and proceed with the EpiFix which I think is good to be a good option here for him. Over top of the EpiFix we will do a contact layer in order to hopefully keep things from drying out. This dressing will actually stay in place until he comes back.  Patient did see vascular yesterday and states that they did not feel like anything was doing worse still it appears that if it were left up to them they think the ideal thing would be to proceed with an amputation. I explained to the patient that to be honest is not always avoidable but if we can try to avoid amputation that is what we are after. He voiced understanding  and he does want to do what ever he can to try to prevent amputation for that reason we are proceeding with the epi cord today. This will be application #1. [id number] upon evaluation today patient appears to be making some progress here regard to the lateral foot. Fortunately there does not appear to be any signs of active infection locally or systemically which is great news. No fevers, chills, nausea, vomiting, or diarrhea. I do think that he is appropriate for second application of EpiCord today. 03-14-2022 upon evaluation today patient's wound still has significant exposure of the bone at this point. He did get crutches he has been trying to use those the past couple of days am not sure that is going so well to be perfectly honest. We did discuss however offloading is important and we even discussed a total contact cast but to be perfectly honest right now and feels like he may need more debridement to clear away some of the bone which is not viable in the end of the foot of the fifth metatarsal but I am unable to really do that here in the office due to his bleeding. I do not know if potentially we could have him seen by podiatry potentially Dr. Merril Abbe to see if there is anything he can do to help in this regard. I am not even certain whether the majority of the metatarsal is even viable to be honest. Either way I do think that he would benefit from surgical debridement or even possibly partial ray amputation in order for [place] to try to keep things moving in the right direction and get this closed. This was all discussed with the  patient today. With that being said I know he wants to avoid a below-knee amputation which is what has previously been recommended. Objective Constitutional Well-nourished and well-hydrated in no acute distress. Vitals Time Taken: 12:53 PM, Height: 70 in, Weight: 180.4 lbs, BMI: 25.9, Temperature: 98.4 F, Pulse: 78 bpm, Respiratory Rate: 16 breaths/min, Blood Pressure: 124/72 mmHg. Maeder, [place] ([id number]) Respiratory normal breathing without difficulty. Psychiatric this patient is able to make decisions and demonstrates good insight into disease process. Alert and Oriented x 3. pleasant and cooperative. General Notes: Upon inspection patient's wound bed actually showed signs of good granulation and the medial portion of the wound which is doing awesome. Without being said on the lateral portion were struggling here and I do believe part of the issue was getting rid of some of the necrotic bone from the infection. I believe his infection is probably cleared he looks much better in that regard and he was on the antibiotics for solid 6 weeks. He also has been doing hyperbarics which I think is also helped in that regard. Nonetheless were still having trouble getting this to completely close due to the fact that we do not really have any good tissue over top of this bone and there is still some necrotic bone noted that I just cannot get off due to the bleeding that occurs. Integumentary (Hair, Skin) Wound #1 status is Open. Original cause of wound was Surgical Injury. The date acquired was: 09/09/2021. The wound has been in treatment 16 weeks. 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 The wound measures 2.5cm length x 0.7cm width x 0.7cm depth; 1.374cm^2 area and 0.962cm^3 volume. There is Fat Layer (Subcutaneous Tissue) exposed. There is no tunneling or undermining noted. There is a medium amount of serosanguineous drainage noted. There is  large (67-100%) red, pink  granulation within the wound bed. There is a small (1-33%) amount of necrotic tissue within the wound bed including Adherent Slough. Wound #2 status is Open. Original cause of wound was Blister. The date acquired was: 10/28/2021. The wound has been in treatment 15 weeks. The wound is located on the Left,Lateral Foot. The wound measures 4cm length x 1.5cm width x 1.4cm depth; 4.712cm^2 area and 6.597cm^3 volume. There is bone, joint, muscle, tendon, and Fat Layer (Subcutaneous Tissue) exposed. There is no tunneling or undermining noted. There is a medium amount of serosanguineous drainage noted. There is small (1-33%) red granulation within the wound bed. There is a large (67-100%) amount of necrotic tissue within the wound bed including Eschar, Adherent Slough and Necrosis of Muscle. 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 - provider visits at wound center Return Appointment in: - HBO- (daily Monday -Friday for HBO Treatments) Home Health: [place] for wound care. May utilize formulary equivalent dressing for wound treatment orders unless otherwise specified. Home Health Nurse may visit PRN to address patient s wound care needs. Scheduled days for dressing changes to be completed; exception, patient has scheduled wound care visit that day. **Please direct any NON-WOUND related issues/requests for orders to patient's Primary Care Physician. **If current dressing causes regression in wound condition, may  D/C ordered dressing product/s and apply Normal Saline Moist Dressing daily until next [place] or Other MD appointment. **Notify Wound Healing Center of regression in wound condition at [phone]. Bathing/ Shower/ Hygiene: Clean wound with Normal Saline or wound cleanser. May shower with wound dressing protected with water repellent cover or cast protector. No tub bath. Cellular or Tissue Based Products: Wound #2 Left,Lateral Foot: Cellular or Tissue Based Product Type: - epifix #3 Cellular or Tissue Based Product applied to wound bed; including contact layer, fixation with steri-strips, dry gauze and cover dressing. (DO NOT REMOVE). Edema Control - Lymphedema / Segmental Compressive Device / Other: Elevate, Exercise Daily and Avoid Standing for Long Periods of Time. Elevate legs to the level of the heart and pump ankles as often as possible Elevate leg(s) parallel to the floor when sitting. Off-Loading: Foot Defender  - size large Additional Orders / Instructions: Stockhausen, [place] ([id number]) Follow Nutritious Diet and Increase Protein Intake Other: - Follow vascular, nephrology and PCP for wound care and other needs Dialysis appt as scheduled M-W-F Hyperbaric Oxygen Therapy: Indication and location: - Left 1st and 2nd Ray Amputation osteomyelitis and left lateral foot osteomyelitis. I added treatments onto the original regimen to account for the lateral foot osteomyelitis. If appropriate for treatment, begin HBOT per protocol: 2.0 ATA for 90 Minutes without Air Breaks One treatment per day (delivered Monday through Friday unless otherwise specified in Special Instructions below): Total # of Treatments: - 100 Finger stick Blood Glucose Pre- and Post- HBOT Treatment. Follow Hyperbaric Oxygen Glycemia Protocol Consults ordered were: [place] , Dr Amalia Hailey , surgical debridement WOUND #1: - Amputation Site - Digit Wound Laterality: Left Cleanser:  Wound Cleanser 3 x Per Week/30 Days Discharge Instructions: Wash your  hands with soap and water. Remove old dressing, discard into plastic bag and place into trash. Cleanse the wound with Wound Cleanser prior to applying a clean dressing using gauze sponges, not tissues or cotton balls. Do not scrub or use excessive force. Pat dry using gauze sponges, not tissue or cotton balls. Primary Dressing: Prisma 4.34 (in) 3 x Per Week/30 Days Discharge Instructions: Moisten w/normal saline or sterile water; Cover wound as directed. Do not remove from wound bed. Secured With: Medipore Tape - 76M Medipore H Soft Cloth Surgical Tape, 2x2 (in/yd) 3 x Per Week/30 Days Secured With: [employer] Sterile or Non-Sterile 6-ply 4.5x4 (yd/yd) 3 x Per Week/30 Days Discharge Instructions: Apply Kerlix as directed Secured With: ABD pad 3 x Per Week/30 Days WOUND #2: - Foot Wound Laterality: Left, Lateral Cleanser: Wound Cleanser 3 x Per Week/30 Days Discharge Instructions: Wash your hands with soap and water. Remove old dressing, discard into plastic bag and place into trash. Cleanse the wound with Wound Cleanser prior to applying a clean dressing using gauze sponges, not tissues or cotton balls. Do not scrub or use excessive force. Pat dry using gauze sponges, not tissue or cotton balls. Primary Dressing: Xeroform 4x4-HBD (in/in) 3 x Per Week/30 Days Discharge Instructions: Apply Xeroform 4x4-HBD (in/in) as directed Secondary Dressing: ABD Pad 5x9 (in/in) 3 x Per Week/30 Days Discharge Instructions: Cover with ABD pad Secondary Dressing: Kerlix 4.5 x 4.1 (in/yd) 3 x Per Week/30 Days Discharge Instructions: Apply Kerlix 4.5 x 4.1 (in/yd) as instructed Secured With: Medipore Tape - 76M Medipore H Soft Cloth Surgical Tape, 2x2 (in/yd) 3 x Per Week/30 Days 1. I am can recommend currently that we go and continue with the wound care measures as before and the patient is in agreement with that plan. This includes the use of  the Xeroform gauze to the lateral foot this is new or not continue the [place] today I do not think that we get the result but I quite want to see you right now. 2. Also can recommend the ABD pad to cover which I think is doing well. 3. I would also suggest that the patient continue with the silver collagen to the main original wound on the medial aspect that seems to be doing well and I think this is getting smaller each time I see it. 4. I did put in a call to Dr. Daylene Katayama I am awaiting his call back for [place] to discuss the patient's case I got approval from the patient to be able to discuss this with him and we have made the referral. I Minna see what he thinks he could potentially do to help [place] out in this regard. Again I know the patient really wants to avoid any surgery that is not necessary but I think there may be at least some debridement that skin will be necessary to a degree here. We will see patient back for reevaluation in 1 week here in the clinic. If anything worsens or changes patient will contact our office for additional recommendations. We will see patient back for reevaluation in 1 week here in the clinic. If anything worsens or changes patient will contact our office for additional recommendations. Electronic Signature(s) Signed: 03/14/2022 4:53:30 PM By: Worthy Keeler PA-C Entered By: Worthy Keeler on 03/14/2022 16:53:30 Varkey, Demetries L. ([id number]) -------------------------------------------------------------------------------- SuperBill Details Patient Name: Carias, Delaine L. Date of Service: 03/14/2022 Medical Record Number: [id number] Patient Account Number: [id number] Date of Birth/Sex: 1966-05-05 (56 y.o. 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 R26.89 Other abnormalities of gait and mobility Facility Procedures CPT4 Code: [id number] Description: 99213 - WOUND CARE VISIT-LEV 3 EST PT Modifier: Quantity: 1 Physician Procedures CPT4 Code: [id number] Description: 99214 - WC PHYS LEVEL 4 - EST PT Modifier: Quantity: 1 CPT4 Code: Description: ICD-10 Diagnosis Description E11.621 Type 2 diabetes mellitus with foot ulcer L97.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

## 2022-03-14 NOTE — Progress Notes (Signed)
Frane, [name] ([id number]) Visit Report for 03/14/2022 Arrival Information Details Patient Name: [name], [name]. Date of Service: 03/14/2022 1:15 PM Medical Record Number: [id number] Patient Account Number: [id number] Date of Birth/Sex: 01/23/1966 (56 y.o. M) Treating RN: [name] Primary Care Mayanna Garlitz: [name] Other Clinician: Referring Jaimere Feutz: [name] Treating Hyde Sires/Extender: [name] in Treatment: 92 Visit Information History Since Last Visit All ordered tests and consults were completed: No Patient Arrived: Ambulatory Added or deleted any medications: No Arrival Time: 12:51 Any new allergies or adverse reactions: No Accompanied By: self Had a fall or experienced change in No Transfer Assistance: None activities of daily living that may affect Patient Identification Verified: Yes risk of falls: Secondary Verification Process Completed: Yes Signs or symptoms of abuse/neglect since last visito No Patient Requires Transmission-Based No Hospitalized since last visit: No Precautions: Implantable device outside of the clinic excluding No Patient Has Alerts: Yes cellular tissue based products placed in the center Patient Alerts: Patient on Blood since last visit: Thinner Has Dressing in Place as Prescribed: Yes Has Compression in Place as Prescribed: Yes Pain Present Now: No Electronic Signature(s) Signed: 03/14/2022 4:19:47 PM By: [name] RN Entered By: [name] on 03/14/2022 12:53:48 Eidson, Ryo L. ([id number]) -------------------------------------------------------------------------------- Clinic Level of Care Assessment Details Patient Name: [name], [name] Date of Service: 03/14/2022 1:15 PM Medical Record Number: [id number] Patient Account Number: [id number] Date of Birth/Sex: 22-Feb-1966 (56 y.o. M) Treating RN: [name] Primary Care Yuette Putnam: [name] Other Clinician: Referring Kattie Santoyo: [name] Treating  Kyllie Pettijohn/Extender: [name] in Treatment: 19 Clinic Level of Care Assessment Items TOOL 4 Quantity Score X - Use when only an EandM is performed on FOLLOW-UP visit 1 0 ASSESSMENTS - Nursing Assessment / Reassessment X - Reassessment of Co-morbidities (includes updates in patient status) 1 10 X- 1 5 Reassessment of Adherence to Treatment Plan ASSESSMENTS - Wound and Skin Assessment / Reassessment '[]'  - Simple Wound Assessment / Reassessment - one wound 0 X- 2 5 Complex Wound Assessment / Reassessment - multiple wounds '[]'  - 0 Dermatologic / Skin Assessment (not related to wound area) ASSESSMENTS - Focused Assessment '[]'  - Circumferential Edema Measurements - multi extremities 0 '[]'  - 0 Nutritional Assessment / Counseling / Intervention '[]'  - 0 Lower Extremity Assessment (monofilament, tuning fork, pulses) '[]'  - 0 Peripheral Arterial Disease Assessment (using hand held doppler) ASSESSMENTS - Ostomy and/or Continence Assessment and Care '[]'  - Incontinence Assessment and Management 0 '[]'  - 0 Ostomy Care Assessment and Management (repouching, etc.) PROCESS - Coordination of Care X - Simple Patient / Family Education for ongoing care 1 15 '[]'  - 0 Complex (extensive) Patient / Family Education for ongoing care '[]'  - 0 Staff obtains Programmer, systems, Records, Test Results / Process Orders '[]'  - 0 Staff telephones HHA, Nursing Homes / Clarify orders / etc '[]'  - 0 Routine Transfer to another Facility (non-emergent condition) '[]'  - 0 Routine Hospital Admission (non-emergent condition) '[]'  - 0 New Admissions / Biomedical engineer / Ordering NPWT, Apligraf, etc. '[]'  - 0 Emergency Hospital Admission (emergent condition) X- 1 10 Simple Discharge Coordination '[]'  - 0 Complex (extensive) Discharge Coordination PROCESS - Special Needs '[]'  - Pediatric / Minor Patient Management 0 '[]'  - 0 Isolation Patient Management '[]'  - 0 Hearing / Language / Visual special needs '[]'  - 0 Assessment of  Community assistance (transportation, D/C planning, etc.) '[]'  - 0 Additional assistance / Altered mentation '[]'  - 0 Support Surface(s) Assessment (bed, cushion, seat, etc.) INTERVENTIONS - Wound Cleansing /  Measurement Surrette, Jermany L. ([id number]) X- 1 5 Simple Wound Cleansing - one wound '[]'  - 0 Complex Wound Cleansing - multiple wounds X- 1 5 Wound Imaging (photographs - any number of wounds) '[]'  - 0 Wound Tracing (instead of photographs) X- 1 5 Simple Wound Measurement - one wound '[]'  - 0 Complex Wound Measurement - multiple wounds INTERVENTIONS - Wound Dressings X - Small Wound Dressing one or multiple wounds 2 10 '[]'  - 0 Medium Wound Dressing one or multiple wounds '[]'  - 0 Large Wound Dressing one or multiple wounds '[]'  - 0 Application of Medications - topical '[]'  - 0 Application of Medications - injection INTERVENTIONS - Miscellaneous '[]'  - External ear exam 0 '[]'  - 0 Specimen Collection (cultures, biopsies, blood, body fluids, etc.) '[]'  - 0 Specimen(s) / Culture(s) sent or taken to Lab for analysis '[]'  - 0 Patient Transfer (multiple staff / Civil Service fast streamer / Similar devices) '[]'  - 0 Simple Staple / Suture removal (25 or less) '[]'  - 0 Complex Staple / Suture removal (26 or more) '[]'  - 0 Hypo / Hyperglycemic Management (close monitor of Blood Glucose) '[]'  - 0 Ankle / Brachial Index (ABI) - do not check if billed separately X- 1 5 Vital Signs Has the patient been seen at the hospital within the last three years: Yes Total Score: 90 Level Of Care: New/Established - Level 3 Electronic Signature(s) Signed: 03/14/2022 4:19:47 PM By: [name] RN Entered By: [name] on 03/14/2022 13:18:37 Staup, Palmer L. ([id number]) -------------------------------------------------------------------------------- Encounter Discharge Information Details Patient Name: [name], [name] Date of Service: 03/14/2022 1:15 PM Medical Record Number: [id number] Patient Account Number: [id number] Date of  Birth/Sex: 01/12/1966 (56 y.o. M) Treating RN: [name] Primary Care Roshawnda Pecora: [name] Other Clinician: Referring Keniya Schlotterbeck: [name] Treating Atwell Mcdanel/Extender: [name] in Treatment: 8 Encounter Discharge Information Items Discharge Condition: Stable Ambulatory Status: Ambulatory Discharge Destination: Home Transportation: Private Auto Accompanied By: self Schedule Follow-up Appointment: Yes Clinical Summary of Care: Patient Declined Electronic Signature(s) Signed: 03/14/2022 4:19:47 PM By: [name] RN Entered By: [name] on 03/14/2022 13:19:23 Magel, [place] ([id number]) -------------------------------------------------------------------------------- Lower Extremity Assessment Details Patient Name: Chimenti, Manfred L. Date of Service: 03/14/2022 1:15 PM Medical Record Number: [id number] Patient Account Number: [id number] Date of Birth/Sex: 1966/02/16 (56 y.o. M) Treating RN: [name] Primary Care Anelly Samarin: [name] Other Clinician: Referring Addison Freimuth: [name] Treating Davis Vannatter/Extender: [name] in Treatment: 19 Electronic Signature(s) Signed: 03/14/2022 4:19:47 PM By: [name] RN Entered By: [name] on 03/14/2022 13:01:18 Fasching, Linden ([id number]) -------------------------------------------------------------------------------- Multi Wound Chart Details Patient Name: Siguenza, Ibrahem L. Date of Service: 03/14/2022 1:15 PM Medical Record Number: [id number] Patient Account Number: [id number] Date of Birth/Sex: [birth year] (56 y.o. 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 Comorbid History: Cataracts, Anemia, Sleep Apnea, Cataracts, Anemia, Sleep Apnea, N/A Arrhythmia, Congestive Heart Arrhythmia, Congestive Heart Failure, Coronary Artery Disease, Failure, Coronary Artery Disease, Hypertension, Myocardial Infarction, Hypertension, Myocardial Infarction, Type II Diabetes, Neuropathy, Type II Diabetes, Neuropathy, Seizure Disorder Seizure Disorder Date Acquired: 09/09/2021 10/28/2021 N/A Weeks of Treatment: 16 15 N/A Wound Status: Open Open  N/A Wound Recurrence: No No N/A Measurements L x W x D (cm) 2.5x0.7x0.7 4x1.5x1.4 N/A Area (cm) : 1.374 4.712 N/A Volume (cm) : 0.962 6.597 N/A % Reduction in Area: 90.30% -50.00% N/A % Reduction in Volume: 97.70% -2001.00% N/A Classification: Full Thickness With Exposed Grade 2 N/A Support Structures Exudate Amount: Medium Medium N/A Exudate Type: Serosanguineous Serosanguineous N/A Exudate Color: red, brown red, brown N/A Granulation Amount: Large (67-100%) Small (1-33%) N/A Granulation Quality: Red, Pink Red N/A Necrotic Amount: Small (1-33%) Large (67-100%) N/A Necrotic Tissue: Adherent [place] N/A Exposed Structures: Fat Layer (Subcutaneous Tissue): Fat Layer (Subcutaneous Tissue): N/A Yes Yes Fascia: No Tendon: Yes Tendon: No Muscle: Yes Muscle: No Joint: Yes Joint: No Bone: Yes Bone: No Fascia: No Epithelialization: None None N/A Treatment Notes Electronic Signature(s) Grigoryan, Woodfin L. ([id number]) Signed: 03/14/2022 4:19:47 PM By: [name] RN Entered By: [name] on 03/14/2022 13:01:29 Vankleeck, [place] ([id number]) -------------------------------------------------------------------------------- [place] Details Patient Name: Sunga, NOAH LEMBKE. Date of Service: 03/14/2022  1:15 PM Medical Record Number: [id number] Patient Account Number: [id number] Date of Birth/Sex: [birth year] (56 y.o. M) Treating RN: [name] Primary Care Haleema Vanderheyden: [name] Other Clinician: Referring Yazlynn Birkeland: [name] Treating Shea Swalley/Extender: [name] in Treatment: 19 Active Inactive HBO Nursing Diagnoses: Anxiety related to feelings of confinement associated with the hyperbaric oxygen chamber Anxiety related to knowledge deficit of hyperbaric oxygen therapy and treatment procedures Discomfort related to temperature and humidity changes inside hyperbaric chamber Potential for barotraumas to ears, sinuses, teeth, and lungs or cerebral gas embolism related to changes in atmospheric pressure inside hyperbaric oxygen chamber Potential for oxygen toxicity seizures related to delivery of 100% oxygen at an increased atmospheric pressure Potential for pulmonary oxygen toxicity related to delivery of 100% oxygen at an increased atmospheric pressure Goals: Barotrauma will be prevented during HBO2 Date Initiated: 10/31/2021 Target Resolution Date: 12/06/2021 Goal Status: Active Patient and/or family will be able to state/discuss factors appropriate to the management of their disease process during treatment Date Initiated: 10/31/2021 Date Inactivated: 02/14/2022 Target Resolution Date: 11/15/2021 Goal Status: Met Patient will tolerate the hyperbaric oxygen therapy treatment Date Initiated: 10/31/2021 Target Resolution Date: 12/06/2021 Goal Status: Active Patient will tolerate the internal climate of the chamber Date Initiated: 10/31/2021 Date Inactivated: 02/14/2022 Target Resolution Date: 11/29/2021 Goal Status: Met Patient/caregiver will verbalize understanding of HBO goals, rationale, procedures and potential hazards Date Initiated: 10/31/2021 Date Inactivated: 02/14/2022 Target Resolution Date: 11/19/2021 Goal Status: Met Signs and symptoms of pulmonary oxygen  toxicity will be recognized and promptly addressed Date Initiated: 10/31/2021 Date Inactivated: 02/14/2022 Target Resolution Date: 12/13/2021 Goal Status: Met Signs and symptoms of seizure will be recognized and promptly addressed ; seizing patients will suffer no harm Date Initiated: 10/31/2021 Target Resolution Date: 12/20/2021 Goal Status: Active Interventions: Administer a five (5) minute air break for patient if signs and symptoms of seizure appear and notify the hyperbaric physician Administer a ten (10) minute air break for patient if signs and symptoms of seizure appear and notify the hyperbaric physician Administer the correct therapeutic gas delivery based on the patients needs and limitations, per physician order Assess and provide for patientos comfort related to the hyperbaric environment and equalization of middle ear Assess for signs and symptoms related to adverse events, including but not limited to confinement anxiety, pneumothorax, oxygen toxicity and baurotrauma Assess patient for any history of confinement anxiety Assess patient's knowledge and expectations regarding hyperbaric medicine and provide education related to the hyperbaric environment, goals  of treatment and prevention of adverse events Notes: Necrotic Tissue Nursing Diagnoses: Impaired tissue integrity related to necrotic/devitalized tissue Lhommedieu, Christapher L. ([id number]) Knowledge deficit related to management of necrotic/devitalized tissue Goals: Necrotic/devitalized tissue will be minimized in the wound bed Date Initiated: 11/28/2021 Target Resolution Date: 11/28/2021 Goal Status: Active Patient/caregiver will verbalize understanding of reason and process for debridement of necrotic tissue Date Initiated: 11/28/2021 Date Inactivated: 02/14/2022 Target Resolution Date: 11/28/2021 Goal Status: Met Interventions: Assess patient pain level pre-, during and post procedure and prior to discharge Provide education on  necrotic tissue and debridement process Treatment Activities: Apply topical anesthetic as ordered : 11/28/2021 Excisional debridement : 11/28/2021 Notes: Wound/Skin Impairment Nursing Diagnoses: Impaired tissue integrity Knowledge deficit related to smoking impact on wound healing Knowledge deficit related to ulceration/compromised skin integrity Goals: Ulcer/skin breakdown will have a volume reduction of 30% by week 4 Date Initiated: 11/28/2021 Target Resolution Date: 12/12/2021 Goal Status: Active Interventions: Assess patient/caregiver ability to obtain necessary supplies Assess patient/caregiver ability to perform ulcer/skin care regimen upon admission and as needed Assess ulceration(s) every visit Treatment Activities: Topical wound management initiated : 11/28/2021 Notes: Electronic Signature(s) Signed: 03/14/2022 4:19:47 PM By: [name] RN Entered By: [name] on 03/14/2022 13:01:22 Peil, Ansel L. ([id number]) -------------------------------------------------------------------------------- Pain Assessment Details Patient Name: Flanagan, Zabian L. Date of Service: 03/14/2022 1:15 PM Medical Record Number: [id number] Patient Account Number: [id number] Date of Birth/Sex: 09-05-1966 (56 y.o. M) Treating RN: [name] Primary Care Coreon Simkins: [name] Other Clinician: Referring Jlyn Cerros: [name] Treating Doug Bucklin/Extender: [name] in Treatment: 19 Active Problems Location of Pain Severity and Description of Pain Patient Has Paino No Site Locations Pain Management and Medication Current Pain Management: Electronic Signature(s) Signed: 03/14/2022 4:19:47 PM By: [name] RN Entered By: [name] on 03/14/2022 12:59:59 Lohmeyer, Langston L. ([id number]) -------------------------------------------------------------------------------- Patient/Caregiver Education Details Patient Name: Santee, Saabir L. Date of Service: 03/14/2022 1:15 PM Medical Record Number:  [id number] Patient Account Number: [id number] Date of Birth/Gender: 07-05-1966 (56 y.o. M) Treating RN: [name] Primary Care Physician: [name] Other Clinician: Referring Physician: [name] Treating Physician/Extender: [name] in Treatment: 2 Education Assessment Education Provided To: Patient Education Topics Provided Wound Debridement: Methods: Explain/Verbal Responses: State content correctly Electronic Signature(s) Signed: 03/14/2022 4:19:47 PM By: [name] RN Entered By: [name] on 03/14/2022 13:12:57 Reznik, Konnor L. ([id number]) -------------------------------------------------------------------------------- Wound Assessment Details Patient Name: Saha, Keigan L. Date of Service: 03/14/2022 1:15 PM Medical Record Number: [id number] Patient Account Number: [id number] Date of Birth/Sex: 01-16-1966 (56 y.o. M) Treating RN: [name] Primary Care Zyren Sevigny: [name] Other Clinician: Referring Ananth Fiallos: [name] Treating Jeronimo Hellberg/Extender: [name] in Treatment: 19 Wound Status Wound Number: 1 Primary Open Surgical Wound Etiology: Wound Location: Left Amputation Site - Digit Wound Open Wounding Event: Surgical Injury Status: Date Acquired: 09/09/2021 Comorbid Cataracts, Anemia, Sleep Apnea, Arrhythmia, Congestive Weeks Of Treatment: 16 History: Heart Failure, Coronary Artery Disease, Hypertension, Clustered Wound: No Myocardial Infarction, Type II Diabetes, Neuropathy, Seizure Disorder Photos Wound Measurements Length: (cm) 2.5 Width: (cm) 0.7 Depth: (cm) 0.7 Area: (cm) 1.374 Volume: (cm) 0.962 % Reduction in Area: 90.3% % Reduction in Volume: 97.7% Epithelialization: None Tunneling: No Undermining: No Wound Description Classification: Full Thickness With Exposed Support Structures Exudate Amount: Medium Exudate Type: Serosanguineous Exudate Color: red, brown Foul Odor After Cleansing:  No Slough/Fibrino No Wound Bed Granulation Amount: Large (67-100%) Exposed Structure Granulation Quality: Red, Pink Fascia Exposed: No Necrotic Amount: Small (1-33%) Fat Layer (Subcutaneous Tissue) Exposed: Yes Necrotic  Quality: Adherent Slough Tendon Exposed: No Muscle Exposed: No Joint Exposed: No Bone Exposed: No Treatment Notes Wound #1 (Amputation Site - Digit) Wound Laterality: Left Cleanser Wound Cleanser Discharge Instruction: Wash your hands with soap and water. Remove old dressing, discard into plastic bag and place into trash. Cleanse the wound with Wound Cleanser prior to applying a clean dressing using gauze sponges, not tissues or cotton balls. Do not Pensinger, [place] ([id number]) scrub or use excessive force. Pat dry using gauze sponges, not tissue or cotton balls. Peri-Wound Care Topical Primary Dressing Prisma 4.34 (in) Discharge Instruction: Moisten w/normal saline or sterile water; Cover wound as directed. Do not remove from wound bed. Secondary Dressing Secured With Medipore Tape - 83M Medipore H Soft Cloth Surgical Tape, 2x2 (in/yd) Kerlix Roll Sterile or Non-Sterile 6-ply 4.5x4 (yd/yd) Discharge Instruction: Apply Kerlix as directed ABD pad Compression Wrap Compression Stockings Add-Ons Electronic Signature(s) Signed: 03/14/2022 4:19:47 PM By: [name] RN Entered By: [name] on 03/14/2022 13:00:39 Camuso, Ellington L. ([id number]) -------------------------------------------------------------------------------- Wound Assessment Details Patient Name: Bruington, Yaroslav L. Date of Service: 03/14/2022 1:15 PM Medical Record Number: [id number] Patient Account Number: [id number] Date of Birth/Sex: December 14, 1965 (56 y.o. M) Treating RN: [name] Primary Care Braleigh Massoud: [name] Other Clinician: Referring Josefine Fuhr: [name] Treating Vayden Weinand/Extender: [name] in Treatment: 19 Wound Status Wound Number: 2 Primary Diabetic Wound/Ulcer of the  Lower Extremity Etiology: Wound Location: Left, Lateral Foot Wound Open Wounding Event: Blister Status: Date Acquired: 10/28/2021 Comorbid Cataracts, Anemia, Sleep Apnea, Arrhythmia, Congestive Weeks Of Treatment: 15 History: Heart Failure, Coronary Artery Disease, Hypertension, Clustered Wound: No Myocardial Infarction, Type II Diabetes, Neuropathy, Seizure Disorder Photos Wound Measurements Length: (cm) 4 Width: (cm) 1.5 Depth: (cm) 1.4 Area: (cm) 4.712 Volume: (cm) 6.597 % Reduction in Area: -50% % Reduction in Volume: -2001% Epithelialization: None Tunneling: No Undermining: No Wound Description Classification: Grade 2 Exudate Amount: Medium Exudate Type: Serosanguineous Exudate Color: red, brown Foul Odor After Cleansing: No Slough/Fibrino Yes Wound Bed Granulation Amount: Small (1-33%) Exposed Structure Granulation Quality: Red Fascia Exposed: No Necrotic Amount: Large (67-100%) Fat Layer (Subcutaneous Tissue) Exposed: Yes Necrotic Quality: Eschar, Adherent Slough Tendon Exposed: Yes Muscle Exposed: Yes Necrosis of Muscle: Yes Joint Exposed: Yes Bone Exposed: Yes Treatment Notes Wound #2 (Foot) Wound Laterality: Left, Lateral Cleanser Wound Cleanser Discharge Instruction: Wash your hands with soap and water. Remove old dressing, discard into plastic bag and place into trash. Cleanse the wound with Wound Cleanser prior to applying a clean dressing using gauze sponges, not tissues or cotton balls. Do not Wee, [place] ([id number]) scrub or use excessive force. Pat dry using gauze sponges, not tissue or cotton balls. Peri-Wound Care Topical Primary Dressing Xeroform 4x4-HBD (in/in) Discharge Instruction: Apply Xeroform 4x4-HBD (in/in) as directed Secondary Dressing ABD Pad 5x9 (in/in) Discharge Instruction: Cover with ABD pad Kerlix 4.5 x 4.1 (in/yd) Discharge Instruction: Apply Kerlix 4.5 x 4.1 (in/yd) as instructed Secured With [place] H Soft Cloth Surgical Tape, 2x2 (in/yd) Compression Wrap Compression Stockings Add-Ons Electronic Signature(s) Signed: 03/14/2022 4:19:47 PM By: [name] RN Entered By: [name] on 03/14/2022 13:01:05 Otani, Dewel L. ([id number]) -------------------------------------------------------------------------------- Vitals Details Patient Name: Aigner, Jonathandavid L. Date of Service: 03/14/2022 1:15 PM Medical Record Number: [id number] Patient Account Number: [id number] Date of Birth/Sex: 22-Sep-1966 (56 y.o. M) Treating RN: [name] Primary Care Hjalmar Ballengee: [name] Other Clinician: Referring Riann Oman: [name] Treating Maite Burlison/Extender: [name] in Treatment: 19 Vital Signs Time Taken: 12:53 Temperature (  F): 98.4 Height (in): 70 Pulse (bpm): 78 Weight (lbs): 180.4 Respiratory Rate (breaths/min): 16 Body Mass Index (BMI): 25.9 Blood Pressure (mmHg): 124/72 Reference Range: 80 - 120 mg / dl Electronic Signature(s) Signed: 03/14/2022 4:19:47 PM By: [name] RN Entered By: [name] on 03/14/2022 12:54:07

## 2022-03-17 DIAGNOSIS — N186 End stage renal disease: Secondary | ICD-10-CM | POA: Diagnosis not present

## 2022-03-17 DIAGNOSIS — Z5181 Encounter for therapeutic drug level monitoring: Secondary | ICD-10-CM | POA: Diagnosis not present

## 2022-03-17 DIAGNOSIS — I96 Gangrene, not elsewhere classified: Secondary | ICD-10-CM | POA: Diagnosis not present

## 2022-03-17 DIAGNOSIS — N2581 Secondary hyperparathyroidism of renal origin: Secondary | ICD-10-CM | POA: Diagnosis not present

## 2022-03-17 DIAGNOSIS — Z992 Dependence on renal dialysis: Secondary | ICD-10-CM | POA: Diagnosis not present

## 2022-03-17 DIAGNOSIS — D631 Anemia in chronic kidney disease: Secondary | ICD-10-CM | POA: Diagnosis not present

## 2022-03-17 DIAGNOSIS — L02612 Cutaneous abscess of left foot: Secondary | ICD-10-CM | POA: Diagnosis not present

## 2022-03-18 ENCOUNTER — Encounter: Payer: Medicare Other | Admitting: Physician Assistant

## 2022-03-18 DIAGNOSIS — L97524 Non-pressure chronic ulcer of other part of left foot with necrosis of bone: Secondary | ICD-10-CM | POA: Diagnosis not present

## 2022-03-18 DIAGNOSIS — E1122 Type 2 diabetes mellitus with diabetic chronic kidney disease: Secondary | ICD-10-CM | POA: Diagnosis not present

## 2022-03-18 DIAGNOSIS — I5042 Chronic combined systolic (congestive) and diastolic (congestive) heart failure: Secondary | ICD-10-CM | POA: Diagnosis not present

## 2022-03-18 DIAGNOSIS — I110 Hypertensive heart disease with heart failure: Secondary | ICD-10-CM | POA: Diagnosis not present

## 2022-03-18 DIAGNOSIS — I2510 Atherosclerotic heart disease of native coronary artery without angina pectoris: Secondary | ICD-10-CM | POA: Diagnosis not present

## 2022-03-18 DIAGNOSIS — E1151 Type 2 diabetes mellitus with diabetic peripheral angiopathy without gangrene: Secondary | ICD-10-CM | POA: Diagnosis not present

## 2022-03-18 DIAGNOSIS — D631 Anemia in chronic kidney disease: Secondary | ICD-10-CM | POA: Diagnosis not present

## 2022-03-18 DIAGNOSIS — E11621 Type 2 diabetes mellitus with foot ulcer: Secondary | ICD-10-CM | POA: Diagnosis not present

## 2022-03-18 DIAGNOSIS — M86672 Other chronic osteomyelitis, left ankle and foot: Secondary | ICD-10-CM | POA: Diagnosis not present

## 2022-03-18 DIAGNOSIS — N186 End stage renal disease: Secondary | ICD-10-CM | POA: Diagnosis not present

## 2022-03-18 DIAGNOSIS — Z4781 Encounter for orthopedic aftercare following surgical amputation: Secondary | ICD-10-CM | POA: Diagnosis not present

## 2022-03-18 DIAGNOSIS — I120 Hypertensive chronic kidney disease with stage 5 chronic kidney disease or end stage renal disease: Secondary | ICD-10-CM | POA: Diagnosis not present

## 2022-03-18 LAB — GLUCOSE, CAPILLARY
Glucose-Capillary: 188 mg/dL — ABNORMAL HIGH (ref 70–99)
Glucose-Capillary: 160 mg/dL — ABNORMAL HIGH (ref 70–99)

## 2022-03-18 NOTE — Progress Notes (Signed)
[place], [place] ([id number]) Visit Report for 03/18/2022 Arrival Information Details Patient Name: [name], [name]. Date of Service: 03/18/2022 8:00 AM Medical Record Number: [id number] Patient Account Number: [id number] Date of Birth/Sex: 1966/01/09 (56 y.o. M) Treating RN: Cornell Barman Primary Care Xylon Croom: [name] Other Clinician: Jacqulyn Bath Referring Shondell Fabel: [name] Treating Janziel Hockett/Extender: [name] in Treatment: 70 Visit Information History Since Last Visit Added or deleted any medications: No Patient Arrived: Ambulatory Any new allergies or adverse reactions: No Arrival Time: 07:50 Had a fall or experienced change in No Accompanied By: self activities of daily living that may affect Transfer Assistance: None risk of falls: Patient Identification Verified: Yes Signs or symptoms of abuse/neglect since last visito No Secondary Verification Process Completed: Yes Hospitalized since last visit: No Patient Requires Transmission-Based No Implantable device outside of the clinic excluding No Precautions: cellular tissue based products placed in the center Patient Has Alerts: Yes since last visit: Patient Alerts: Patient on Blood Has Dressing in Place as Prescribed: Yes Thinner Has Footwear/Offloading in Place as Prescribed: Yes Left: Other:Defemder Boot Pain Present Now: No Electronic Signature(s) Signed: 03/18/2022 11:11:17 AM By: Enedina Finner RCP, RRT, CHT Entered By: Stark Jock, Amado Nash on 03/18/2022 08:59:42 [name], [name] ([id number]) -------------------------------------------------------------------------------- Encounter Discharge Information Details Patient Name: [name], [name] Date of Service: 03/18/2022 8:00 AM Medical Record Number: [id number] Patient Account Number: [id number] Date of Birth/Sex: [birth year] (56 y.o. M) Treating RN: Cornell Barman Primary Care Oziah Vitanza: [name] Other Clinician: Jacqulyn Bath Referring Kayah Hecker: [name] Treating Addyson Traub/Extender: [name] in Treatment: 19 Encounter Discharge Information Items Discharge Condition: Stable Ambulatory Status: Ambulatory Discharge Destination: Home Transportation: Private Auto Accompanied By: self Schedule Follow-up Appointment: Yes Clinical Summary of Care: Notes Patient has an HBO treatment scheduled on 03/19/22 at 11:00 am. Electronic Signature(s) Signed: 03/18/2022 11:11:17 AM By: Enedina Finner RCP, RRT, CHT Entered By: Stark Jock, Amado Nash on 03/18/2022 11:06:51 [name], [name] ([id number]) -------------------------------------------------------------------------------- Vitals Details Patient Name: [name], [name] Date of Service: 03/18/2022 8:00 AM Medical Record Number: [id number] Patient Account Number: [id number] Date of Birth/Sex: Jan 26, 1966 (56 y.o. M) Treating RN: Cornell Barman Primary Care Arlin Sass: [name] Other Clinician: Jacqulyn Bath Referring Scottlyn Mchaney: [name] Treating Adonus Uselman/Extender: [name] in Treatment: 19 Vital Signs Time Taken: 08:07 Temperature (F): 98.2 Height (in): 70 Pulse (bpm): 72 Weight (lbs): 180.4 Respiratory Rate (breaths/min): 16 Body Mass Index (BMI): 25.9 Blood Pressure (mmHg): 132/72 Capillary Blood Glucose (mg/dl): 188 Reference Range: 80 - 120 mg / dl Electronic Signature(s) Signed: 03/18/2022 11:11:17 AM By: Enedina Finner RCP, RRT, CHT Entered By: Enedina Finner on 03/18/2022 09:01:35

## 2022-03-18 NOTE — Progress Notes (Signed)
[place], [place] ([id number]) Visit Report for 03/18/2022 Problem List Details Patient Name: [name], [name]. Date of Service: 03/18/2022 8:00 AM Medical Record Number: [id number] Patient Account Number: [id number] Date of Birth/Sex: 1966-06-23 (56 y.o. 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 ([id number]) -------------------------------------------------------------------------------- SuperBill Details Patient Name: [name], [name] Date of Service: 03/18/2022 Medical Record Number: [id number] Patient Account Number: [id number] Date of Birth/Sex: 1965/12/29 (56 y.o. 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 R26.89 Other abnormalities of gait and mobility Facility Procedures CPT4 Code: 16073710 Description: (Facility Use Only) HBOT, full body chamber, 47min Modifier: Quantity: 4 Physician Procedures CPT4 Code: 6269485 Description: 46270 - WC PHYS HYPERBARIC OXYGEN THERAPY Modifier: Quantity: 1 CPT4 Code: Description: ICD-10 Diagnosis Description M86.672 Other chronic osteomyelitis, 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

## 2022-03-18 NOTE — Progress Notes (Addendum)
S[place]ley, [place] ([id number]) Visit Report for 03/18/2022 HBO Details Patient Name: [name], [name] Date of Service: 03/18/2022 8:00 AM Medical Record Number: [id number] Patient Account Number: [id number] Date of Birth/Sex: [birth year] (56 y.o. 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 HBO Treatment Details Treatment Number: 88 Patient Type: Outpatient Chamber Type: Monoplace Chamber Serial #: [id number] Treatment Protocol: 2.0 ATA with 90 minutes oxygen, and no air breaks Treatment Details Compression Rate Down: 1.0 psi / minute De-Compression Rate Up: 1.5 psi / minute Compress Tx Pressure Air breaks and breathing periods Decompress Decompress Begins Reached (leave unused spaces blank) Begins Ends Chamber Pressure (ATA) 1 2 - - - - - - 2 1 Clock Time (24 hr) 08:14 08:31 - - - - - - 10:02 10:11 Treatment Length: 117 (minutes) Treatment Segments: 4 Vital Signs Capillary Blood Glucose Reference Range: 80 - 120 mg / dl HBO Diabetic Blood Glucose Intervention Range: <131 mg/dl or >249 mg/dl Time Vitals Blood Respiratory Capillary Blood Glucose Pulse Action Type: Pulse: Temperature: Taken: Pressure: Rate: Glucose (mg/dl): Meter #: Oximetry (%) Taken: Pre 08:07 132/72 72 16 98.2 188 1 none per protocol Post 10:21 128/72 84 16 98.5 160 1 none per protocol Treatment Response Treatment Toleration: Well Treatment Completion Treatment Completed without Adverse Event Status: HBO Attestation I certify that I supervised this HBO treatment in accordance with Medicare guidelines. A trained emergency response team is readily  Yes available per hospital policies and procedures. Continue HBOT as ordered. Yes Electronic Signature(s) Signed: 03/18/2022 5:16:58 PM By: Worthy Keeler PA-C Previous Signature: 03/18/2022 11:11:17 AM Version By: Enedina Finner RCP, RRT, CHT Previous Signature: 03/18/2022 12:09:48 PM Version By: Worthy Keeler PA-C Entered By: Worthy Keeler on 03/18/2022 17:16:58 Aird, Joshuwa L. ([id number]) -------------------------------------------------------------------------------- HBO Safety Checklist Details Patient Name: [name], [name] Date of Service: 03/18/2022 8:00 AM Medical Record Number: [id number] Patient Account Number: [id number] Date of Birth/Sex: 1966-07-19 (56 y.o. M) Treating RN: Cornell Barman Primary Care [name]: [name] Other Clinician: Jacqulyn Bath Referring Camil Wilhelmsen: [name] Treating Jamin Humphries/Extender: [name] in Treatment: 19 HBO Safety Checklist Items Safety Checklist Consent Form Signed Patient voided / foley secured and emptied When did you last eato 07:00 am Last dose of injectable or oral agent n/a Ostomy pouch emptied and vented if applicable NA All implantable devices assessed, documented and approved NA Intravenous access site secured and place NA Valuables secured Linens and cotton and cotton/polyester blend (less than 51% polyester) Personal oil-based products / skin lotions / body lotions removed Wigs or hairpieces removed NA Smoking or tobacco materials removed NA Books / newspapers / magazines / loose paper removed NA Cologne, aftershave, perfume and deodorant removed Jewelry removed (may wrap wedding band) Make-up removed NA Hair care products removed Battery operated devices (external) removed NA Heating patches and chemical warmers removed NA Titanium eyewear removed NA Nail polish cured greater than 10 hours NA Casting material cured greater than 10 hours NA Hearing aids removed NA Loose dentures  or partials removed NA Prosthetics have been removed NA Patient demonstrates correct use of air break device (if applicable) Patient concerns have been addressed Patient grounding bracelet on and cord attached to chamber Specifics for Inpatients (complete in addition  to above) Medication sheet sent with patient Intravenous medications needed or due during therapy sent with patient Drainage tubes (e.g. nasogastric tube or chest tube secured and vented) Endotracheal or Tracheotomy tube secured Cuff deflated of air and inflated with saline Airway suctioned Electronic Signature(s) Signed: 03/18/2022 11:11:17 AM By: Enedina Finner RCP, RRT, CHT Entered By: Enedina Finner on 03/18/2022 09:03:10

## 2022-03-19 ENCOUNTER — Encounter (HOSPITAL_BASED_OUTPATIENT_CLINIC_OR_DEPARTMENT_OTHER): Payer: Medicare Other | Admitting: Internal Medicine

## 2022-03-19 DIAGNOSIS — E11621 Type 2 diabetes mellitus with foot ulcer: Secondary | ICD-10-CM | POA: Diagnosis not present

## 2022-03-19 DIAGNOSIS — Z992 Dependence on renal dialysis: Secondary | ICD-10-CM | POA: Diagnosis not present

## 2022-03-19 DIAGNOSIS — I2510 Atherosclerotic heart disease of native coronary artery without angina pectoris: Secondary | ICD-10-CM | POA: Diagnosis not present

## 2022-03-19 DIAGNOSIS — D631 Anemia in chronic kidney disease: Secondary | ICD-10-CM | POA: Diagnosis not present

## 2022-03-19 DIAGNOSIS — I110 Hypertensive heart disease with heart failure: Secondary | ICD-10-CM | POA: Diagnosis not present

## 2022-03-19 DIAGNOSIS — M86672 Other chronic osteomyelitis, left ankle and foot: Secondary | ICD-10-CM

## 2022-03-19 DIAGNOSIS — N186 End stage renal disease: Secondary | ICD-10-CM | POA: Diagnosis not present

## 2022-03-19 DIAGNOSIS — N2581 Secondary hyperparathyroidism of renal origin: Secondary | ICD-10-CM | POA: Diagnosis not present

## 2022-03-19 DIAGNOSIS — I5042 Chronic combined systolic (congestive) and diastolic (congestive) heart failure: Secondary | ICD-10-CM | POA: Diagnosis not present

## 2022-03-19 DIAGNOSIS — L97524 Non-pressure chronic ulcer of other part of left foot with necrosis of bone: Secondary | ICD-10-CM

## 2022-03-19 DIAGNOSIS — I96 Gangrene, not elsewhere classified: Secondary | ICD-10-CM | POA: Diagnosis not present

## 2022-03-19 DIAGNOSIS — L02612 Cutaneous abscess of left foot: Secondary | ICD-10-CM | POA: Diagnosis not present

## 2022-03-19 LAB — GLUCOSE, CAPILLARY
Glucose-Capillary: 249 mg/dL — ABNORMAL HIGH (ref 70–99)
Glucose-Capillary: 154 mg/dL — ABNORMAL HIGH (ref 70–99)

## 2022-03-19 NOTE — Progress Notes (Signed)
[name], [name] ([id number]) Visit Report for 03/19/2022 HBO Details Patient Name: [name], [name] Date of Service: 03/19/2022 11:00 AM Medical Record Number: [id number] Patient Account Number: [id number] Date of Birth/Sex: 10-13-1966 (56 y.o. 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 HBO Treatment Details Treatment Number: 89 Patient Type: Outpatient Chamber Type: Monoplace Chamber Serial #: [id number] Treatment Protocol: 2.0 ATA with 90 minutes oxygen, and no air breaks Treatment Details Compression Rate Down: 1.0 psi / minute De-Compression Rate Up: 1.5 psi / minute Compress Tx Pressure Air breaks and breathing periods Decompress Decompress Begins Reached (leave unused spaces blank) Begins Ends Chamber Pressure (ATA) 1 2 - - - - - - 2 1 Clock Time (24 hr) 10:07 10:24 - - - - - - 11:54 12:04 Treatment Length: 117 (minutes) Treatment Segments: 4 Vital Signs Capillary Blood Glucose Reference Range: 80 - 120 mg / dl HBO Diabetic Blood Glucose Intervention Range: <131 mg/dl or >249 mg/dl Time Vitals Blood Respiratory Capillary Blood Glucose Pulse Action Type: Pulse: Temperature: Taken: Pressure: Rate: Glucose (mg/dl): Meter #: Oximetry (%) Taken: Pre 09:57 132/78 72 16 98.2 249 1 none Post 12:16 126/72 72 16 98.4 154 1 none per protocol Treatment Response Treatment Toleration: Well Treatment Completion Treatment Completed without Adverse Event Status: HBO Attestation I certify that I supervised this HBO treatment in accordance with Medicare guidelines. A trained emergency response team is readily  Yes available per hospital policies and procedures. Continue HBOT as ordered. Yes Electronic Signature(s) Signed: 03/19/2022 1:03:26 PM By: Kalman Shan DO Entered By: Kalman Shan on 03/19/2022 13:02:55 [name], [name] ([id number]) -------------------------------------------------------------------------------- HBO Safety Checklist Details Patient Name: [name], [name] Date of Service: 03/19/2022 11:00 AM Medical Record Number: [id number] Patient Account Number: [id number] Date of Birth/Sex: Apr 29, 1966 (56 y.o. M) Treating RN: [name] Primary Care Ioana Louks: [name] Other Clinician: Jacqulyn Bath Referring Artavious Trebilcock: [name] Treating Eris Hannan/Extender: Yaakov Guthrie in Treatment: 19 HBO Safety Checklist Items Safety Checklist Consent Form Signed Patient voided / foley secured and emptied When did you last eato 09:30 Last dose of injectable or oral agent n/a Ostomy pouch emptied and vented if applicable NA All implantable devices assessed, documented and approved NA Intravenous access site secured and place NA Valuables secured Linens and cotton and cotton/polyester blend (less than 51% polyester) Personal oil-based products / skin lotions / body lotions removed Wigs or hairpieces removed NA Smoking or tobacco materials removed NA Books / newspapers / magazines / loose paper removed NA Cologne, aftershave, perfume and deodorant removed Jewelry removed (may wrap wedding band) Make-up removed NA Hair care products removed Battery operated devices (external) removed NA Heating patches and chemical warmers removed NA Titanium eyewear removed NA Nail polish cured greater than 10 hours NA Casting material cured greater than 10 hours NA Hearing aids removed NA Loose dentures or partials removed NA Prosthetics have been removed NA Patient demonstrates correct use of air break device (if applicable) Patient concerns have been  addressed Patient grounding bracelet on and cord attached to chamber Specifics for Inpatients (complete in addition to above) Medication sheet sent with patient Intravenous medications needed or due during therapy sent with patient Drainage tubes (e.g. nasogastric tube or chest tube secured and vented) Endotracheal  or Tracheotomy tube secured Cuff deflated of air and inflated with saline Airway suctioned Electronic Signature(s) Signed: 03/19/2022 1:08:04 PM By: Enedina Finner RCP, RRT, CHT Entered By: Stark Jock, Amado Nash on 03/19/2022 10:29:25

## 2022-03-19 NOTE — Progress Notes (Signed)
[place], [place] ([id number]) Visit Report for 03/19/2022 Arrival Information Details Patient Name: [name], [name]. Date of Service: 03/19/2022 11:00 AM Medical Record Number: [id number] Patient Account Number: [id number] Date of Birth/Sex: August 15, 1966 (56 y.o. M) Treating RN: [name] Primary Care Lucilla Petrenko: [name] Other Clinician: Jacqulyn Bath Referring Ezriel Boffa: [name] Treating Mariame Rybolt/Extender: Yaakov Guthrie in Treatment: 61 Visit Information History Since Last Visit Added or deleted any medications: No Patient Arrived: Ambulatory Any new allergies or adverse reactions: No Arrival Time: 09:55 Had a fall or experienced change in No Accompanied By: self activities of daily living that may affect Transfer Assistance: None risk of falls: Patient Identification Verified: Yes Signs or symptoms of abuse/neglect since last visito No Secondary Verification Process Completed: Yes Hospitalized since last visit: No Patient Requires Transmission-Based No Implantable device outside of the clinic excluding No Precautions: cellular tissue based products placed in the center Patient Has Alerts: Yes since last visit: Patient Alerts: Patient on Blood Has Dressing in Place as Prescribed: Yes Thinner Has Footwear/Offloading in Place as Prescribed: Yes Left: Other:Defender Boot Pain Present Now: No Electronic Signature(s) Signed: 03/19/2022 1:08:04 PM By: Enedina Finner RCP, RRT, CHT Entered By: Stark Jock, Amado Nash on 03/19/2022 10:27:36 Durflinger, Charleton L. ([id number]) -------------------------------------------------------------------------------- Encounter Discharge Information Details Patient Name: [name], [name] Date of Service: 03/19/2022 11:00 AM Medical Record Number: [id number] Patient Account Number: [id number] Date of Birth/Sex: [birth year] (56 y.o. M) Treating RN: [name] Primary Care Zaleah Ternes: [name] Other Clinician: Jacqulyn Bath Referring Aleeya Veitch: [name] Treating Dalayza Zambrana/Extender: Yaakov Guthrie in Treatment: 86 Encounter Discharge Information Items Discharge Condition: Stable Ambulatory Status: Ambulatory Discharge Destination: Home Transportation: Private Auto Accompanied By: self Schedule Follow-up Appointment: Yes Clinical Summary of Care: Notes Patient has an HBO treatment scheduled on 03/20/22 at 08:00 am. Electronic Signature(s) Signed: 03/19/2022 1:08:04 PM By: Enedina Finner RCP, RRT, CHT Entered By: Stark Jock, Amado Nash on 03/19/2022 12:29:39 [name], [name] ([id number]) -------------------------------------------------------------------------------- Vitals Details Patient Name: [name], [name] Date of Service: 03/19/2022 11:00 AM Medical Record Number: [id number] Patient Account Number: [id number] Date of Birth/Sex: 12-May-1966 (56 y.o. M) Treating RN: [name] Primary Care Traivon Morrical: [name] Other Clinician: Jacqulyn Bath Referring Rhyse Skowron: [name] Treating Hiyab Nhem/Extender: Yaakov Guthrie in Treatment: 19 Vital Signs Time Taken: 09:57 Temperature (F): 98.2 Height (in): 70 Pulse (bpm): 72 Weight (lbs): 180.4 Respiratory Rate (breaths/min): 16 Body Mass Index (BMI): 25.9 Blood Pressure (mmHg): 132/78 Capillary Blood Glucose (mg/dl): 249 Reference Range: 80 - 120 mg / dl Electronic Signature(s) Signed: 03/19/2022 1:08:04 PM By: Enedina Finner RCP, RRT, CHT Entered By: Enedina Finner on 03/19/2022 70:26:37

## 2022-03-20 ENCOUNTER — Encounter: Payer: Medicare Other | Attending: Physician Assistant | Admitting: Physician Assistant

## 2022-03-20 DIAGNOSIS — M86672 Other chronic osteomyelitis, left ankle and foot: Secondary | ICD-10-CM | POA: Diagnosis not present

## 2022-03-20 DIAGNOSIS — I5042 Chronic combined systolic (congestive) and diastolic (congestive) heart failure: Secondary | ICD-10-CM | POA: Insufficient documentation

## 2022-03-20 DIAGNOSIS — I2510 Atherosclerotic heart disease of native coronary artery without angina pectoris: Secondary | ICD-10-CM | POA: Diagnosis not present

## 2022-03-20 DIAGNOSIS — R269 Unspecified abnormalities of gait and mobility: Secondary | ICD-10-CM | POA: Diagnosis not present

## 2022-03-20 DIAGNOSIS — E1122 Type 2 diabetes mellitus with diabetic chronic kidney disease: Secondary | ICD-10-CM | POA: Diagnosis not present

## 2022-03-20 DIAGNOSIS — G40909 Epilepsy, unspecified, not intractable, without status epilepticus: Secondary | ICD-10-CM | POA: Insufficient documentation

## 2022-03-20 DIAGNOSIS — I110 Hypertensive heart disease with heart failure: Secondary | ICD-10-CM | POA: Diagnosis not present

## 2022-03-20 DIAGNOSIS — E1151 Type 2 diabetes mellitus with diabetic peripheral angiopathy without gangrene: Secondary | ICD-10-CM | POA: Diagnosis not present

## 2022-03-20 DIAGNOSIS — R2689 Other abnormalities of gait and mobility: Secondary | ICD-10-CM | POA: Diagnosis not present

## 2022-03-20 DIAGNOSIS — E1140 Type 2 diabetes mellitus with diabetic neuropathy, unspecified: Secondary | ICD-10-CM | POA: Diagnosis not present

## 2022-03-20 DIAGNOSIS — L97524 Non-pressure chronic ulcer of other part of left foot with necrosis of bone: Secondary | ICD-10-CM | POA: Insufficient documentation

## 2022-03-20 DIAGNOSIS — E1136 Type 2 diabetes mellitus with diabetic cataract: Secondary | ICD-10-CM | POA: Diagnosis not present

## 2022-03-20 DIAGNOSIS — E11621 Type 2 diabetes mellitus with foot ulcer: Secondary | ICD-10-CM | POA: Diagnosis not present

## 2022-03-20 DIAGNOSIS — E1129 Type 2 diabetes mellitus with other diabetic kidney complication: Secondary | ICD-10-CM | POA: Diagnosis not present

## 2022-03-20 DIAGNOSIS — I96 Gangrene, not elsewhere classified: Secondary | ICD-10-CM | POA: Diagnosis not present

## 2022-03-20 DIAGNOSIS — Z899 Acquired absence of limb, unspecified: Secondary | ICD-10-CM | POA: Diagnosis not present

## 2022-03-20 DIAGNOSIS — N2581 Secondary hyperparathyroidism of renal origin: Secondary | ICD-10-CM | POA: Diagnosis not present

## 2022-03-20 DIAGNOSIS — N186 End stage renal disease: Secondary | ICD-10-CM | POA: Insufficient documentation

## 2022-03-20 DIAGNOSIS — I132 Hypertensive heart and chronic kidney disease with heart failure and with stage 5 chronic kidney disease, or end stage renal disease: Secondary | ICD-10-CM | POA: Insufficient documentation

## 2022-03-20 DIAGNOSIS — D631 Anemia in chronic kidney disease: Secondary | ICD-10-CM | POA: Diagnosis not present

## 2022-03-20 DIAGNOSIS — Z992 Dependence on renal dialysis: Secondary | ICD-10-CM | POA: Insufficient documentation

## 2022-03-20 LAB — GLUCOSE, CAPILLARY
Glucose-Capillary: 193 mg/dL — ABNORMAL HIGH (ref 70–99)
Glucose-Capillary: 175 mg/dL — ABNORMAL HIGH (ref 70–99)

## 2022-03-20 NOTE — Progress Notes (Signed)
[place], [place] ([id number]) Visit Report for 03/20/2022 Problem List Details Patient Name: [name], [name]. Date of Service: 03/20/2022 8:00 AM Medical Record Number: [id number] Patient Account Number: [id number] Date of Birth/Sex: Jan 16, 1966 (56 y.o. 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 Date of Service: 03/20/2022 Medical Record Number: [id number] Patient Account Number: [id number] Date of Birth/Sex: 1966-03-27 (56 y.o. 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 R26.89 Other abnormalities of gait and mobility Facility Procedures CPT4 Code: 07622633 Description: (Facility Use Only) HBOT, full body chamber, 35min Modifier: Quantity: 4 Physician Procedures CPT4 Code: 3545625 Description: 63893 - WC PHYS HYPERBARIC OXYGEN THERAPY Modifier: Quantity: 1 CPT4 Code: Description: ICD-10 Diagnosis Description M86.672 Other chronic osteomyelitis, left ankle 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

## 2022-03-20 NOTE — Progress Notes (Signed)
Gellert, [place] ([id number]) Visit Report for 03/20/2022 Arrival Information Details Patient Name: MATYAS, BAISLEY. Date of Service: 03/20/2022 8:00 AM Medical Record Number: [id number] Patient Account Number: [id number] Date of Birth/Sex: Mar 28, 1966 (56 y.o. M) Treating RN: [name] Primary Care Yoshie Kosel: [name] Other Clinician: Jacqulyn Bath Referring Godson Pollan: [name] Treating Thomasine Klutts/Extender: [name] in Treatment: 61 Visit Information History Since Last Visit Added or deleted any medications: No Patient Arrived: Ambulatory Any new allergies or adverse reactions: No Arrival Time: 07:55 Had a fall or experienced change in No Accompanied By: self activities of daily living that may affect Transfer Assistance: None risk of falls: Patient Identification Verified: Yes Signs or symptoms of abuse/neglect since last visito No Secondary Verification Process Completed: Yes Hospitalized since last visit: No Patient Requires Transmission-Based No Implantable device outside of the clinic excluding No Precautions: cellular tissue based products placed in the center Patient Has Alerts: Yes since last visit: Patient Alerts: Patient on Blood Has Dressing in Place as Prescribed: Yes Thinner Has Footwear/Offloading in Place as Prescribed: Yes Left: Other:Defender Boot; crutches Pain Present Now: No Electronic Signature(s) Signed: 03/20/2022 10:59:39 AM By: Enedina Finner RCP, RRT, CHT Entered By: Stark Jock, Amado Nash on 03/20/2022 08:18:09 Kouns, Chrisean L. ([id number]) -------------------------------------------------------------------------------- Encounter Discharge Information Details Patient Name: Torok, Caeden L. Date of Service: 03/20/2022 8:00 AM Medical Record Number: [id number] Patient Account Number: [id number] Date of Birth/Sex: [birth year] (56 y.o. M) Treating RN: [name] Primary Care Bryttney Netzer: [name] Other Clinician: Jacqulyn Bath Referring Tashanda Fuhrer: [name] Treating Anissia Wessells/Extender: [name] in Treatment: 20 Encounter Discharge Information Items Discharge Condition: Stable Ambulatory Status: Ambulatory Discharge Destination: Home Transportation: Private Auto Accompanied By: self Schedule Follow-up Appointment: Yes Clinical Summary of Care: Notes Patient has an HBO treatment scheduled on 03/21/22 at 11:00 am. Electronic Signature(s) Signed: 03/20/2022 10:59:39 AM By: Enedina Finner RCP, RRT, CHT Entered By: Enedina Finner on 03/20/2022 10:59:15 Apsey, Makye L. ([id number]) -------------------------------------------------------------------------------- Vitals Details Patient Name: Corniel, Dekota L. Date of Service: 03/20/2022 8:00 AM Medical Record Number: [id number] Patient Account Number: [id number] Date of Birth/Sex: May 03, 1966 (56 y.o. M) Treating RN: [name] Primary Care Jettie Mannor: [name] Other Clinician: Jacqulyn Bath Referring Falicity Sheets: [name] Treating Annastacia Duba/Extender: [name] in Treatment: 20 Vital Signs Time Taken: 08:12 Temperature (F): 98.2 Height (in): 70 Pulse (bpm): 78 Weight (lbs): 180.4 Respiratory Rate (breaths/min): 16 Body Mass Index (BMI): 25.9 Blood Pressure (mmHg): 130/66 Capillary Blood Glucose (mg/dl): 193 Reference Range: 80 - 120 mg / dl Electronic Signature(s) Signed: 03/20/2022 10:59:39 AM By: Enedina Finner RCP, RRT, CHT Entered By: Enedina Finner on 03/20/2022 08:19:12

## 2022-03-20 NOTE — Progress Notes (Signed)
[place], [place] ([id number]) Visit Report for 03/20/2022 HBO Details Patient Name: [name], [name] Date of Service: 03/20/2022 8:00 AM Medical Record Number: [id number] Patient Account Number: [id number] Date of Birth/Sex: 10/06/1966 (56 y.o. 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 HBO Treatment Details Treatment Number: 90 Patient Type: Outpatient Chamber Type: Monoplace Chamber Serial #: [id number] Treatment Protocol: 2.0 ATA with 90 minutes oxygen, and no air breaks Treatment Details Compression Rate Down: 1.0 psi / minute De-Compression Rate Up: 1.5 psi / minute Compress Tx Pressure Air breaks and breathing periods Decompress Decompress Begins Reached (leave unused spaces blank) Begins Ends Chamber Pressure (ATA) 1 2 - - - - - - 2 1 Clock Time (24 hr) 08:11 08:28 - - - - - - 09:58 10:08 Treatment Length: 117 (minutes) Treatment Segments: 4 Vital Signs Capillary Blood Glucose Reference Range: 80 - 120 mg / dl HBO Diabetic Blood Glucose Intervention Range: <131 mg/dl or >249 mg/dl Time Vitals Blood Respiratory Capillary Blood Glucose Pulse Action Type: Pulse: Temperature: Taken: Pressure: Rate: Glucose (mg/dl): Meter #: Oximetry (%) Taken: Pre 08:12 130/66 78 16 98.2 193 1 none per protocol Post 10:17 128/76 60 16 97.8 175 1 none per protocol Treatment Response Treatment Toleration: Well Treatment Completion Treatment Completed without Adverse Event Status: HBO Attestation I certify that I supervised this HBO treatment in accordance with Medicare guidelines. A trained emergency response team is readily  Yes available per hospital policies and procedures. Continue HBOT as ordered. Yes Electronic Signature(s) Signed: 03/20/2022 4:23:48 PM By: Worthy Keeler PA-C Previous Signature: 03/20/2022 10:59:39 AM Version By: Enedina Finner RCP, RRT, CHT Previous Signature: 03/20/2022 4:22:20 PM Version By: Worthy Keeler PA-C Entered By: Worthy Keeler on 03/20/2022 16:23:48 [name], [name] ([id number]) -------------------------------------------------------------------------------- HBO Safety Checklist Details Patient Name: [name], [name] Date of Service: 03/20/2022 8:00 AM Medical Record Number: [id number] Patient Account Number: [id number] Date of Birth/Sex: [birth year] (56 y.o. M) Treating RN: [name] Primary Care Jilliane Kazanjian: [name] Other Clinician: Jacqulyn Bath Referring Leaf [employer]: [name] Treating Hyland Mollenkopf/Extender: [name] in Treatment: 20 HBO Safety Checklist Items Safety Checklist Consent Form Signed Patient voided / foley secured and emptied When did you last eato 07:00 am Last dose of injectable or oral agent n/a Ostomy pouch emptied and vented if applicable NA All implantable devices assessed, documented and approved NA Intravenous access site secured and place NA Valuables secured Linens and cotton and cotton/polyester blend (less than 51% polyester) Personal oil-based products / skin lotions / body lotions removed Wigs or hairpieces removed NA Smoking or tobacco materials removed NA Books / newspapers / magazines / loose paper removed NA Cologne, aftershave, perfume and deodorant removed Jewelry removed (may wrap wedding band) Make-up removed NA Hair care products removed Battery operated devices (external) removed NA Heating patches and chemical warmers removed NA Titanium eyewear removed NA Nail polish cured greater than 10 hours NA Casting material cured greater than 10 hours NA Hearing aids removed NA Loose dentures or  partials removed NA Prosthetics have been removed NA Patient demonstrates correct use of air break device (if applicable) Patient concerns have been addressed Patient grounding bracelet on and cord attached to chamber Specifics for Inpatients (complete in addition  to above) Medication sheet sent with patient Intravenous medications needed or due during therapy sent with patient Drainage tubes (e.g. nasogastric tube or chest tube secured and vented) Endotracheal or Tracheotomy tube secured Cuff deflated of air and inflated with saline Airway suctioned Electronic Signature(s) Signed: 03/20/2022 10:59:39 AM By: Enedina Finner RCP, RRT, CHT Entered By: Enedina Finner on 03/20/2022 03:12:81

## 2022-03-21 ENCOUNTER — Encounter: Payer: Medicare Other | Admitting: Physician Assistant

## 2022-03-21 ENCOUNTER — Encounter: Payer: Medicare Other | Attending: Physician Assistant | Admitting: Physician Assistant

## 2022-03-21 DIAGNOSIS — I132 Hypertensive heart and chronic kidney disease with heart failure and with stage 5 chronic kidney disease, or end stage renal disease: Secondary | ICD-10-CM | POA: Diagnosis not present

## 2022-03-21 DIAGNOSIS — I110 Hypertensive heart disease with heart failure: Secondary | ICD-10-CM | POA: Diagnosis not present

## 2022-03-21 DIAGNOSIS — Z899 Acquired absence of limb, unspecified: Secondary | ICD-10-CM | POA: Insufficient documentation

## 2022-03-21 DIAGNOSIS — E1136 Type 2 diabetes mellitus with diabetic cataract: Secondary | ICD-10-CM | POA: Insufficient documentation

## 2022-03-21 DIAGNOSIS — I5042 Chronic combined systolic (congestive) and diastolic (congestive) heart failure: Secondary | ICD-10-CM | POA: Diagnosis not present

## 2022-03-21 DIAGNOSIS — L97524 Non-pressure chronic ulcer of other part of left foot with necrosis of bone: Secondary | ICD-10-CM | POA: Insufficient documentation

## 2022-03-21 DIAGNOSIS — Z992 Dependence on renal dialysis: Secondary | ICD-10-CM | POA: Diagnosis not present

## 2022-03-21 DIAGNOSIS — M86672 Other chronic osteomyelitis, left ankle and foot: Secondary | ICD-10-CM | POA: Diagnosis not present

## 2022-03-21 DIAGNOSIS — R2689 Other abnormalities of gait and mobility: Secondary | ICD-10-CM | POA: Insufficient documentation

## 2022-03-21 DIAGNOSIS — N186 End stage renal disease: Secondary | ICD-10-CM | POA: Diagnosis not present

## 2022-03-21 DIAGNOSIS — E11621 Type 2 diabetes mellitus with foot ulcer: Secondary | ICD-10-CM | POA: Insufficient documentation

## 2022-03-21 DIAGNOSIS — D631 Anemia in chronic kidney disease: Secondary | ICD-10-CM | POA: Diagnosis not present

## 2022-03-21 DIAGNOSIS — E1122 Type 2 diabetes mellitus with diabetic chronic kidney disease: Secondary | ICD-10-CM | POA: Diagnosis not present

## 2022-03-21 DIAGNOSIS — I2510 Atherosclerotic heart disease of native coronary artery without angina pectoris: Secondary | ICD-10-CM | POA: Diagnosis not present

## 2022-03-21 DIAGNOSIS — I96 Gangrene, not elsewhere classified: Secondary | ICD-10-CM | POA: Diagnosis not present

## 2022-03-21 DIAGNOSIS — N2581 Secondary hyperparathyroidism of renal origin: Secondary | ICD-10-CM | POA: Diagnosis not present

## 2022-03-21 LAB — GLUCOSE, CAPILLARY: Glucose-Capillary: 271 mg/dL — ABNORMAL HIGH (ref 70–99)

## 2022-03-21 NOTE — Progress Notes (Signed)
Barringer, [place] ([id number]) Visit Report for 03/21/2022 Problem List Details Patient Name: [name], [name]. Date of Service: 03/21/2022 11:00 AM Medical Record Number: [id number] Patient Account Number: [id number] Date of Birth/Sex: Mar 23, 1966 (56 y.o. 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 Date of Service: 03/21/2022 Medical Record Number: [id number] Patient Account Number: [id number] Date of Birth/Sex: 1966/05/24 (56 y.o. 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 R26.89 Other abnormalities of gait and mobility Facility Procedures CPT4 Code: 95188416 Description: (Facility Use Only) HBOT, full body chamber, 75min Modifier: Quantity: 4 Physician Procedures CPT4 Code: 6063016 Description: 01093 - WC PHYS HYPERBARIC OXYGEN THERAPY Modifier: Quantity: 1 CPT4 Code: Description: ICD-10 Diagnosis Description M86.672 Other chronic osteomyelitis, left ankle and foot 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

## 2022-03-21 NOTE — Progress Notes (Addendum)
[place]rit[place], [place] ([id number]) Visit Report for 03/21/2022 Chief Complaint Document Details Patient Name: [name]. Date of Service: 03/21/2022 1:15 PM Medical Record Number: [id number] Patient Account Number: [id number] Date of Birth/Sex: 1966-01-29 (56 y.o. M) Treating RN: [name] Primary Care Provider: [name] Other Clinician: Referring Provider: [name] Treating Provider/Extender: [name] in Treatment: 20 Information Obtained from: Patient Chief Complaint HBO evaluation for treatment on the left foot diabetic ulcer with osteomyelitis. Patient is currently under our care as well for his foot. Electronic Signature(s) Signed: 03/21/2022 12:54:16 PM By: Worthy Keeler PA-C Entered By: Worthy Keeler on 03/21/2022 12:54:15 Bell, Jary L. ([id number]) -------------------------------------------------------------------------------- HPI Details Patient Name: [name], [name] Date of Service: 03/21/2022 1:15 PM Medical Record Number: [id number] Patient Account Number: [id number] Date of Birth/Sex: November 29, 1965 (56 y.o. M) Treating RN: [name] Primary Care Provider: [name] Other Clinician: Referring Provider: [name] Treating Provider/Extender: [name] in Treatment: 20 History of Present Illness HPI Description: 10/31/2021 patient presents today for evaluation here in our clinic. He is actually being evaluated for hyperbaric oxygen therapy only. He has been seen at [facility] up to this point he is under the care of the vascular surgery specialty. Subsequently he is also under the care of the hyperbaric center there. With that being said that due to the fact that he is a dialysis patient he was actually recommended to be transferred to [place] for his treatments he actually lives in [place] which is where his dialysis is. It was a hardship for him to try to get from [place] especially on dialysis days all the way to [facility] get  into the facility for his treatment and get back home he was literally spending all day long. He has had just 4 treatments there at [facility] thus far based on what I see in these have not been continued while he awaits approval here in our clinic. Subsequently I do have records for review that will be included in the HPI predetermination review and attached to this chart as well. I will not duplicate that here. 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 Nonetheless he continues to have exposed bone with marked soft tissue swelling compatible with osteomyelitis. This is due to the irregularity of the head of the metatarsal as well. Nonetheless along with having associated cellulitis he is also good to be seen by infectious disease and is currently on antibiotics for this as well. Again that will be detailed in the pretreatment review section which I will attach to this note as well. He does currently have a wound VAC in place and again the wound was not evaluated here in the clinic as that is being completely managed by home health and [facility]. The patient does have a history of diabetes mellitus type 2, hypertension, coronary artery disease, and congestive heart failure. He also has cataracts of both eyes but no evidence of glaucoma on his most recent eye exam. 11/22/2021 we have been seeing this gentleman for hyperbaric oxygen therapy but have not actually evaluated his wound up to this point this was being managed by the wound care and vascular team I presumed at [facility] at least that is what has been told to be previous. Nonetheless at this point I got a call from Atlee Abide who is  a physician assistant at the [facility] vascular clinic with Dr. Durene Fruits and subsequently they were wanting to know if I could take over wound care for this patient.  Apparently they do not have the ability for an office debridement which again I completely understand but nonetheless definitely is not an integral part of his healing along with the hyperbarics. Subsequently I discussed with the patient today that I do believe he would benefit going ahead and proceeding with evaluation here in the clinic for that reason I did go ahead and see him today to get things started. There is also been some confusion about the issues with his home health nurse and getting measurements to [facility] for the wound VAC in order to continue his wound VAC therapy. With that being said after I see him today we will make a determination on what to do with wound VAC we can definitely send measurements to [facility] but again the bigger question is whether this is the best way to go or not. 11/28/2021 upon evaluation today patient appears to be doing decently well in regard to his wound. He has been tolerating the dressing changes with the Dakin's moistened gauze dressing which I think is doing a good job. Fortunately I do not see any signs of active infection locally nor systemically at this point. In general I think that he is actually making excellent progress which is great news. 2/17; seen in conjunction with HBO today. 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 Much improved condition of the wound surface. On the lateral foot and eschared area that he has been using Betadine. Tolerating HBO well 12/13/2021 upon evaluation today patient appears to be doing pretty well in regard to his foot ulcer. I am actually much more pleased currently that I been with the way the appearance looks today. The wound bed does show some signs of slough and biofilm on the surface of the wound this is minimal I Minna perform debridement the I will do so very carefully due to the fact that to be honest the patient had a significant amount of bleeding in the past being on the Brilinta  he had a difficult time clotting when I debrided him previously quite significantly. Fortunately I do not see any signs of active infection at this time which is great news. No fevers, chills, nausea, vomiting, or diarrhea. 12/20/2021 upon evaluation today patient actually appears to be showing some signs of excellent improvement here. I am very pleased with where things stand currently. There does not appear to be any evidence of active infection locally nor systemically which is great news and I do believe that with the hyperbaric oxygen therapy he is actually making excellent progress. 3/9; patient presents for follow-up. He continues to do HBO therapy without issues. He has completed 34 out of 40 sessions. He continues to use Dakin's wet-to-dry to the amputation site and Betadine to the lateral foot wound. he currently denies signs of infection. 01/03/2022 upon evaluation today patient appears to be doing better in regard to his distal foot ulcer. Unfortunately the lateral foot is a different story this area has softened up and is getting need to be debrided away today. I Georgina Peer do this with scissors and forceps and was able to clear that out that will be detailed below. Otherwise I feel like the Dakin's moistened gauze dressing is doing an awesome job for him and he is making significantly improvements here. 01/10/2022 upon  evaluation today patient continues to have some of the necrotic tissue loosen up on the lateral portion of his foot the main wound that we have been treating is actually doing quite well. Overall very pleased with where things stand in general. No fevers, chills, nausea, vomiting, or diarrhea. 01/17/2022 upon evaluation today patient appears to be doing somewhat poorly in regard to the lateral foot wound. The main wound that we initially were seeing him for looks to be doing great. This lateral foot has worsened and unfortunately it appears to potentially have some signs  of infection here. I do believe that we may need to see about getting started on some IV antibiotics he is in dialysis so they could potentially do this for him, Monday. Nonetheless I want to try to get that started ASAP. Again I would prefer him be initiated with IV vancomycin and cefepime just as an empiric broad-spectrum until we can get the culture results back at least. Subsequently also think that this should be done sooner rather than later due to the fact that his leg is more swollen and overall this seems to have progressed fairly rapidly since last week I do not want this to get worse. He voiced understanding. 01-24-2022 upon evaluation today patient appears to be doing well with regard to his main ulcer that we have been seeing him for. I am very pleased with where things stand currently. He still continues to have bleeding if we do any type of aggressive sharp debridement there for him [name], [name] ([id number]) having to be very careful using Dakin's solution along with debridement as I can here in the office. Overall the patient seems to be doing well. Unfortunately the lateral foot wound still has progressed a little bit further. We did get him on antibiotics starting this past Monday I think the drainage looks much better and overall I am seeing improvement but again this was already broken down to a certain degree I am hopeful by next week this will really start turnaround for [place]. We will also get a send him for an x-ray to evaluate for any signs of osteomyelitis on this lateral foot. 01-31-2022 upon evaluation today patient's wound on the medial left amputation site is actually doing excellent. I am very pleased with where things stand this is significantly smaller and we are headed in the right direction. In regard to the left lateral foot there is still some necrotic tissue to be cleared away although I see evidence of this starting to basically solidify as far as the edges are  concerned with how far it spread I do not see anything seeming to want to spread further. I believe this may be close to done as far as any evolving is concerned. 02-07-2022 upon evaluation today patient appears to be doing well with regard to the main amputation site. Unfortunately the secondary site on the left lateral foot is still giving him some trouble here. There is a lot of dried tissue and I do believe that along with the Dakin's we may want to use a contact layer in the base of the wound to try to prevent the bone from drying out because we are down to that point at this time. The patient voiced understanding at this point as well and is happy to do what ever it takes to try to get this better. With that being said I do believe that it is possible he may benefit from a wound VAC. Again the question  is the approval process for getting this done obviously we can see what we can do if we get things cleaned a little bit better but right now I think the best option is going to be for [place] to continue with the packing currently. 02-14-2022 upon evaluation today patient presents for follow-up concerning ongoing issues with his foot. He did have the MRI which does show that he has evidence of osteomyelitis of the digit at the fifth location of his foot. Subsequently this does extend down to the base of the digit and into the proximal portion of the metatarsal as well. With that being said the bone does not appear to be too severely broken down and a lot of the marrow signal is stated to be maintained which is good news. Nonetheless I do believe that he is unfortunately still in a spot where this is precarious and we need to make sure that we are treating this appropriately. He does seem to be doing well with IV antibiotics he also seems to be doing quite well when it comes to the overall appearance of the wound bed at this point. I am happy in that regard. 02-21-2022 upon evaluation today patient appears  to be doing well all things considered in regard to his wounds. I do feel like that he is making some progress here. The wound on the right distal foot where the amputation site is is actually healing nicely I feel like we can switch over to collagen at this point that should do well for him. In regard to the left lateral foot this is actually getting much cleaner he still has some area where there seems to be some pressure getting to the location I think that he would do well with a knee scooter at this point. I discussed that with him he does not seem prone to going that route at this point he understands it can definitely help. 02-28-2022 upon evaluation today patient appears to be doing well currently with regard to the wound on his foot. The main surgical wound is actually significantly improved and the lateral wound is doing better there is still bone exposed but nonetheless this does not appear to be significantly worse than what we have noted previous. In fact I think it looks better than it did last week. My goal is to keep the bone from drying out and subsequently for that reason we are going to go ahead and proceed with the EpiFix which I think is good to be a good option here for him. Over top of the EpiFix we will do a contact layer in order to hopefully keep things from drying out. This dressing will actually stay in place until he comes back. Patient did see vascular yesterday and states that they did not feel like anything was doing worse still it appears that if it were left up to them they think the ideal thing would be to proceed with an amputation. I explained to the patient that to be honest is not always avoidable but if we can try to avoid amputation that is what we are after. He voiced understanding and he does want to do what ever he can to try to prevent amputation for that reason we are proceeding with the epi cord today. This will be application #1. [id number] upon evaluation  today patient appears to be making some progress here regard to the lateral foot. Fortunately there does not appear to be any signs of active infection locally  or systemically which is great news. No fevers, chills, nausea, vomiting, or diarrhea. I do think that he is appropriate for second application of EpiCord today. 03-14-2022 upon evaluation today patient's wound still has significant exposure of the bone at this point. He did get crutches he has been trying to use those the past couple of days am not sure that is going so well to be perfectly honest. We did discuss however offloading is important and we even discussed a total contact cast but to be perfectly honest right now and feels like he may need more debridement to clear away some of the bone which is not viable in the end of the foot of the fifth metatarsal but I am unable to really do that here in the office due to his bleeding. I do not know if potentially we could have him seen by podiatry potentially Dr. Merril Abbe to see if there is anything he can do to help in this regard. I am not even certain whether the majority of the metatarsal is even viable to be honest. Either way I do think that he would benefit from surgical debridement or even possibly partial ray amputation in order for [place] to try to keep things moving in the right direction and get this closed. This was all discussed with the patient today. With that being said I know he wants to avoid a below-knee amputation which is what has previously been recommended. 03-21-2022 upon evaluation today patient appears to be doing well in regard to the medial wound at this is shown signs of excellent improvement. In regard to the lateral wound this is a bit dry compared to what I want to see him Regino Schultze not draining as much but I think that we need to see what we can do about trying to keep this from drying out and keeping it a bit more moist. Patient voiced understanding. Overall I am  very pleased otherwise with where we stand. Electronic Signature(s) Signed: 03/21/2022 2:11:57 PM By: Worthy Keeler PA-C Entered By: Worthy Keeler on 03/21/2022 14:11:56 [name], [name] ([id number]) -------------------------------------------------------------------------------- Physical Exam Details Patient Name: [name], [name] Date of Service: 03/21/2022 1:15 PM Medical Record Number: [id number] Patient Account Number: [id number] Date of Birth/Sex: 07-11-1966 (56 y.o. M) Treating RN: [name] Primary Care Provider: [name] Other Clinician: Referring Provider: [name] Treating Provider/Extender: [name] in Treatment: 76 Constitutional Well-nourished and well-hydrated in no acute distress. Respiratory normal breathing without difficulty. Psychiatric this patient is able to make decisions and demonstrates good insight into disease process. Alert and Oriented x 3. pleasant and cooperative. Notes Upon inspection patient's wound bed actually showed signs of good granulation epithelization at this point. Fortunately that is on the medial aspect. On the lateral aspect the area is very dry including the bone this is not good and I need to do something to try to keep this from drying out. We may continue with the Xeroform again to put hydrogel down first however to try to prevent this drying. Electronic Signature(s) Signed: 03/21/2022 2:12:18 PM By: Worthy Keeler PA-C Entered By: Worthy Keeler on 03/21/2022 14:12:18 [name], [name] ([id number]) -------------------------------------------------------------------------------- Physician Orders Details Patient Name: [name], [name] Date of Service: 03/21/2022 1:15 PM Medical Record Number: [id number] Patient Account Number: [id number] Date of Birth/Sex: 09-08-1966 (56 y.o. M) Treating RN: [name] Primary Care Provider: [name] Other Clinician: Referring Provider: [name] Treating Provider/Extender:  [name] in Treatment: 20 Verbal /  Phone Orders: No Diagnosis Coding ICD-10 Coding Code Description E11.621 Type 2 diabetes mellitus with foot ulcer 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e M86.672 Other chronic osteomyelitis, left ankle and foot I10 Essential (primary) hypertension I25.10 Atherosclerotic heart disease of native coronary artery without angina pectoris I50.42 Chronic combined systolic (congestive) and diastolic (congestive) heart failure R26.89 Other abnormalities of gait and mobility Follow-up Appointments o Return Appointment in 1 week. - provider visits at wound center o Return Appointment in: - HBO- (daily Monday -Friday for HBO Treatments) [place] for wound care. May utilize formulary equivalent dressing for wound treatment orders unless otherwise specified. Home Health Nurse may visit PRN to address patientos wound care needs. o Scheduled days for dressing changes to be completed; exception, patient has scheduled wound care visit that day. o **Please direct any NON-WOUND related issues/requests for orders to patient's Primary Care Physician. **If current dressing causes regression in wound condition, may D/C ordered dressing product/s and apply Normal Saline Moist Dressing daily until next [place] or Other MD appointment. **Notify Wound Healing Center of regression in wound condition at [phone]. Bathing/ Shower/ Hygiene o Clean wound with Normal Saline or wound cleanser. o May shower with wound dressing protected with water repellent cover or cast protector. o No tub bath. Cellular or Tissue Based Products Wound #2 Left,Lateral Foot o Cellular or Tissue Based Product applied to wound bed; including contact layer, fixation with steri-strips, dry gauze and cover dressing. (DO NOT REMOVE). Edema Control - Lymphedema  / Segmental Compressive Device / Other o Elevate, Exercise Daily and Avoid Standing for Long Periods of Time. o Elevate legs to the level of the heart and pump ankles as often as possible o Elevate leg(s) parallel to the floor when sitting. Off-Loading Left Lower Extremity o Foot Defender o - size large Additional Orders / Instructions o Follow Nutritious Diet and Increase Protein Intake o Other: - Follow vascular, nephrology and PCP for wound care and other needs Dialysis appt as scheduled M-W-F Dr Amalia Hailey 03/31/22 Hyperbaric Oxygen Therapy o Indication and location: - Left 1st and 2nd Ray Amputation osteomyelitis and left lateral foot osteomyelitis. I added treatments onto the original regimen to account for the lateral foot osteomyelitis. o If appropriate for treatment, begin HBOT per protocol: o 2.0 ATA for 90 Minutes without Air Breaks o One treatment per day (delivered Monday through Friday unless otherwise specified in Special Instructions below): Byer, Throckmorton ([id number]) o Total # of Treatments: - 100 o Finger stick Blood Glucose Pre- and Post- HBOT Treatment. o Follow Hyperbaric Oxygen Glycemia Protocol Wound Treatment Wound #1 - Amputation Site - Digit Wound Laterality: Left Cleanser: Wound Cleanser 3 x Per Week/30 Days Discharge Instructions: Wash your hands with soap and water. Remove old dressing, discard into plastic bag and place into trash. Cleanse the wound with Wound Cleanser prior to applying a clean dressing using gauze sponges, not tissues or cotton balls. Do not scrub or use excessive force. Pat dry using gauze sponges, not tissue or cotton balls. Primary Dressing: Prisma 4.34 (in) 3 x Per Week/30 Days Discharge Instructions: Moisten w/normal saline or sterile water; Cover wound as directed. Do not remove from wound bed. Secured With: Medipore Tape - 63M Medipore H Soft Cloth Surgical Tape, 2x2 (in/yd) 3 x Per Week/30 Days Secured With:  [employer] Sterile or Non-Sterile 6-ply 4.5x4 (yd/yd) 3 x Per Week/30 Days  Discharge Instructions: Apply Kerlix as directed Secured With: ABD pad 3 x Per Week/30 Days Wound #2 - Foot Wound Laterality: Left, Lateral Cleanser: Wound Cleanser 3 x Per Week/30 Days Discharge Instructions: Wash your hands with soap and water. Remove old dressing, discard into plastic bag and place into trash. Cleanse the wound with Wound Cleanser prior to applying a clean dressing using gauze sponges, not tissues or cotton balls. Do not scrub or use excessive force. Pat dry using gauze sponges, not tissue or cotton balls. Primary Dressing: Xeroform 4x4-HBD (in/in) 3 x Per Week/30 Days Discharge Instructions: Apply Xeroform 4x4-HBD (in/in) as directed Secondary Dressing: ABD Pad 5x9 (in/in) 3 x Per Week/30 Days Discharge Instructions: Cover with ABD pad Secondary Dressing: Gauze 3 x Per Week/30 Days Discharge Instructions: moistened with surgical lube Secondary Dressing: Kerlix 4.5 x 4.1 (in/yd) 3 x Per Week/30 Days Discharge Instructions: Apply Kerlix 4.5 x 4.1 (in/yd) as instructed Secured With: Medipore Tape - 40M Medipore H Soft Cloth Surgical Tape, 2x2 (in/yd) 3 x Per Week/30 Days GLYCEMIA INTERVENTIONS PROTOCOL PRE-HBO GLYCEMIA INTERVENTIONS ACTION INTERVENTION Obtain pre-HBO capillary blood glucose (ensure 1 physician order is in chart). A. Notify HBO physician and await physician orders. 2 If result is 70 mg/dl or below: B. If the result meets the hospital definition of a critical result, follow hospital policy. A. Give patient an 8 ounce Glucerna Shake, an 8 ounce Ensure, or 8 ounces of a Glucerna/Ensure equivalent dietary supplement*. B. Wait 30 minutes. If result is 71 mg/dl to 130 mg/dl: C. Retest patientos capillary blood glucose (CBG). D. If result greater than or equal to 110 mg/dl, proceed with HBO. If result less than 110 mg/dl, notify HBO physician and consider holding HBO. If  result is 131 mg/dl to 249 mg/dl: A. Proceed with HBO. A. Notify HBO physician and await physician orders. B. It is recommended to hold HBO and do If result is 250 mg/dl or greater: blood/urine ketone testing. C. If the result meets the hospital definition of a critical result, follow hospital policy. POST-HBO GLYCEMIA INTERVENTIONS Couse, Per L. ([id number]) ACTION INTERVENTION Obtain post HBO capillary blood glucose (ensure 1 physician order is in chart). A. Notify HBO physician and await physician orders. 2 If result is 70 mg/dl or below: B. If the result meets the hospital definition of a critical result, follow hospital policy. A. Give patient an 8 ounce Glucerna Shake, an 8 ounce Ensure, or 8 ounces of a Glucerna/Ensure equivalent dietary supplement*. B. Wait 15 minutes for symptoms of hypoglycemia (i.e. nervousness, anxiety, If result is 71 mg/dl to 100 mg/dl: sweating, chills, clamminess, irritability, confusion, tachycardia or dizziness). C. If patient asymptomatic, discharge patient. If patient symptomatic, repeat capillary blood glucose (CBG) and notify HBO physician. If result is 101 mg/dl to 249 mg/dl: A. Discharge patient. A. Notify HBO physician and await physician orders. B. It is recommended to do blood/urine If result is 250 mg/dl or greater: ketone testing. C. If the result meets the hospital definition of a critical result, follow hospital policy. *Juice or candies are NOT equivalent products. If patient refuses the Glucerna or Ensure, please consult the hospital dietitian for an appropriate substitute. Electronic Signature(s) Signed: 03/21/2022 4:26:56 PM By: [name] RN Signed: 03/21/2022 4:36:57 PM By: Worthy Keeler PA-C Entered By: [name] on 03/21/2022 14:03:22 [name], [name] ([id number]) -------------------------------------------------------------------------------- Problem List Details Patient Name: [name], [name] Date of  Service: 03/21/2022 1:15 PM Medical Record Number: [id number] Patient Account Number: [id number] Date of Birth/Sex: [birth year] (  57 y.o. 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/31/2021 No Yes bone M86.672 Other chronic osteomyelitis, left ankle and foot 10/31/2021 No Yes I10 Essential (primary) hypertension 10/31/2021 No Yes I25.10 Atherosclerotic heart disease of native coronary artery without angina 10/31/2021 No Yes pectoris I50.42 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 Date of Service: 03/21/2022 1:15 PM Medical Record Number: [id number] Patient Account Number: [id number] Date of Birth/Sex: 23-Nov-1965 (56 y.o. M) Treating RN: [name] Primary Care Provider: [name] Other Clinician: Referring Provider: [name] Treating Provider/Extender: [name] in Treatment: 20 Subjective Chief Complaint Information obtained from Patient HBO evaluation for treatment on the left foot diabetic ulcer with osteomyelitis. Patient is currently under our care as well for his foot. History of Present Illness (HPI) 10/31/2021 patient presents today for evaluation here in our clinic.  He is actually being evaluated for hyperbaric oxygen therapy only. He has been seen at [facility] up to this point he is under the care of the vascular surgery specialty. Subsequently he is also under the care of the hyperbaric center there. With that being said that due to the fact that he is a dialysis patient he was actually recommended to be transferred to [place] for his treatments he actually lives in [place] which is where his dialysis is. It was a hardship for him to try to get from [place] especially on dialysis days all the way to [facility] get into the facility for his treatment and get back home he was literally spending all day long. He has had just 4 treatments there at [facility] thus far based on what I see in these have not been continued while he awaits approval here in our clinic. Subsequently I do have records for review that will be included in the HPI predetermination review and attached to this chart as well. I will not duplicate that here. 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 Nonetheless he continues to have exposed bone with marked soft tissue swelling compatible with osteomyelitis. This is due to the irregularity of the head of the metatarsal as well. Nonetheless along with having associated cellulitis he is also good to be seen by infectious disease and is currently on antibiotics for this as well. Again that will be detailed in the pretreatment review section which I will attach to this note as well. He does currently have a wound VAC in place and again the wound was not evaluated here in the clinic as that is being completely managed by home health and [facility]. The patient does have a history of diabetes mellitus type 2, hypertension, coronary artery disease, and congestive heart failure. He also  has cataracts of both eyes but no evidence of glaucoma on his most  recent eye exam. 11/22/2021 we have been seeing this gentleman for hyperbaric oxygen therapy but have not actually evaluated his wound up to this point this was being managed by the wound care and vascular team I presumed at [place] at least that is what has been told to be previous. Nonetheless at this point I got a call from Atlee Abide who is a Librarian, academic at the [facility] vascular clinic with Dr. Durene Fruits and subsequently they were wanting to know if I could take over wound care for this patient. Apparently they do not have the ability for an office debridement which again I completely understand but nonetheless definitely is not an integral part of his healing along with the hyperbarics. Subsequently I discussed with the patient today that I do believe he would benefit going ahead and proceeding with evaluation here in the clinic for that reason I did go ahead and see him today to get things started. There is also been some confusion about the issues with his home health nurse and getting measurements to [facility] for the wound VAC in order to continue his wound VAC therapy. With that being said after I see him today we will make a determination on what to do with wound VAC we can definitely send measurements to [facility] but again the bigger question is whether this is the best way to go or not. 11/28/2021 upon evaluation today patient appears to be doing decently well in regard to his wound. He has been tolerating the dressing changes with the Dakin's moistened gauze dressing which I think is doing a good job. Fortunately I do not see any signs of active infection locally nor systemically at this point. In general I think that he is actually making excellent progress which is great news. 2/17; seen in conjunction with HBO today. 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 Much improved condition of the wound  surface. On the lateral foot and eschared area that he has been using Betadine. Tolerating HBO well 12/13/2021 upon evaluation today patient appears to be doing pretty well in regard to his foot ulcer. I am actually much more pleased currently that I been with the way the appearance looks today. The wound bed does show some signs of slough and biofilm on the surface of the wound this is minimal I Minna perform debridement the I will do so very carefully due to the fact that to be honest the patient had a significant amount of bleeding in the past being on the Brilinta he had a difficult time clotting when I debrided him previously quite significantly. Fortunately I do not see any signs of active infection at this time which is great news. No fevers, chills, nausea, vomiting, or diarrhea. 12/20/2021 upon evaluation today patient actually appears to be showing some signs of excellent improvement here. I am very pleased with where things stand currently. There does not appear to be any evidence of active infection locally nor systemically which is great news and I do believe that with the hyperbaric oxygen therapy he is actually making excellent progress. 3/9; patient presents for follow-up. He continues to do HBO therapy without issues. He has completed 34 out of 40 sessions. He continues to use Dakin's wet-to-dry to the amputation site and Betadine to the lateral foot wound. he currently denies signs of infection. 01/03/2022 upon evaluation today patient appears to be doing better in regard to his distal foot ulcer. Unfortunately the lateral foot  is a different story this area has softened up and is getting need to be debrided away today. I Georgina Peer do this with scissors and forceps and was able to clear that out that will be detailed below. Otherwise I feel like the Dakin's moistened gauze dressing is doing an awesome job for him and he is making significantly improvements here. 01/10/2022 upon evaluation  today patient continues to have some of the necrotic tissue loosen up on the lateral portion of his foot the main wound that we have been treating is actually doing quite well. Overall very pleased with where things stand in general. No fevers, chills, nausea, vomiting, or diarrhea. 01/17/2022 upon evaluation today patient appears to be doing somewhat poorly in regard to the lateral foot wound. The main wound that we initially were seeing him for looks to be doing great. This lateral foot has worsened and unfortunately it appears to potentially have some signs of infection here. I do believe that we may need to see about getting started on some IV antibiotics he is in dialysis so they could potentially do this for him, Monday. Nonetheless I want to try to get that started ASAP. Again I would prefer him be initiated with IV vancomycin and cefepime just as an empiric broad-spectrum until we can get the culture results back at least. Subsequently also think that this should be done sooner rather Rung, [place] ([id number]) than later due to the fact that his leg is more swollen and overall this seems to have progressed fairly rapidly since last week I do not want this to get worse. He voiced understanding. 01-24-2022 upon evaluation today patient appears to be doing well with regard to his main ulcer that we have been seeing him for. I am very pleased with where things stand currently. He still continues to have bleeding if we do any type of aggressive sharp debridement there for him having to be very careful using Dakin's solution along with debridement as I can here in the office. Overall the patient seems to be doing well. Unfortunately the lateral foot wound still has progressed a little bit further. We did get him on antibiotics starting this past Monday I think the drainage looks much better and overall I am seeing improvement but again this was already broken down to a certain degree I am hopeful  by next week this will really start turnaround for [place]. We will also get a send him for an x-ray to evaluate for any signs of osteomyelitis on this lateral foot. 01-31-2022 upon evaluation today patient's wound on the medial left amputation site is actually doing excellent. I am very pleased with where things stand this is significantly smaller and we are headed in the right direction. In regard to the left lateral foot there is still some necrotic tissue to be cleared away although I see evidence of this starting to basically solidify as far as the edges are concerned with how far it spread I do not see anything seeming to want to spread further. I believe this may be close to done as far as any evolving is concerned. 02-07-2022 upon evaluation today patient appears to be doing well with regard to the main amputation site. Unfortunately the secondary site on the left lateral foot is still giving him some trouble here. There is a lot of dried tissue and I do believe that along with the Dakin's we may want to use a contact layer in the base of the wound to  try to prevent the bone from drying out because we are down to that point at this time. The patient voiced understanding at this point as well and is happy to do what ever it takes to try to get this better. With that being said I do believe that it is possible he may benefit from a wound VAC. Again the question is the approval process for getting this done obviously we can see what we can do if we get things cleaned a little bit better but right now I think the best option is going to be for [place] to continue with the packing currently. 02-14-2022 upon evaluation today patient presents for follow-up concerning ongoing issues with his foot. He did have the MRI which does show that he has evidence of osteomyelitis of the digit at the fifth location of his foot. Subsequently this does extend down to the base of the digit and into the proximal portion of the  metatarsal as well. With that being said the bone does not appear to be too severely broken down and a lot of the marrow signal is stated to be maintained which is good news. Nonetheless I do believe that he is unfortunately still in a spot where this is precarious and we need to make sure that we are treating this appropriately. He does seem to be doing well with IV antibiotics he also seems to be doing quite well when it comes to the overall appearance of the wound bed at this point. I am happy in that regard. 02-21-2022 upon evaluation today patient appears to be doing well all things considered in regard to his wounds. I do feel like that he is making some progress here. The wound on the right distal foot where the amputation site is is actually healing nicely I feel like we can switch over to collagen at this point that should do well for him. In regard to the left lateral foot this is actually getting much cleaner he still has some area where there seems to be some pressure getting to the location I think that he would do well with a knee scooter at this point. I discussed that with him he does not seem prone to going that route at this point he understands it can definitely help. 02-28-2022 upon evaluation today patient appears to be doing well currently with regard to the wound on his foot. The main surgical wound is actually significantly improved and the lateral wound is doing better there is still bone exposed but nonetheless this does not appear to be significantly worse than what we have noted previous. In fact I think it looks better than it did last week. My goal is to keep the bone from drying out and subsequently for that reason we are going to go ahead and proceed with the EpiFix which I think is good to be a good option here for him. Over top of the EpiFix we will do a contact layer in order to hopefully keep things from drying out. This dressing will actually stay in place until  he comes back. Patient did see vascular yesterday and states that they did not feel like anything was doing worse still it appears that if it were left up to them they think the ideal thing would be to proceed with an amputation. I explained to the patient that to be honest is not always avoidable but if we can try to avoid amputation that is what we are after. He  voiced understanding and he does want to do what ever he can to try to prevent amputation for that reason we are proceeding with the epi cord today. This will be application #1. [id number] upon evaluation today patient appears to be making some progress here regard to the lateral foot. Fortunately there does not appear to be any signs of active infection locally or systemically which is great news. No fevers, chills, nausea, vomiting, or diarrhea. I do think that he is appropriate for second application of EpiCord today. 03-14-2022 upon evaluation today patient's wound still has significant exposure of the bone at this point. He did get crutches he has been trying to use those the past couple of days am not sure that is going so well to be perfectly honest. We did discuss however offloading is important and we even discussed a total contact cast but to be perfectly honest right now and feels like he may need more debridement to clear away some of the bone which is not viable in the end of the foot of the fifth metatarsal but I am unable to really do that here in the office due to his bleeding. I do not know if potentially we could have him seen by podiatry potentially Dr. Merril Abbe to see if there is anything he can do to help in this regard. I am not even certain whether the majority of the metatarsal is even viable to be honest. Either way I do think that he would benefit from surgical debridement or even possibly partial ray amputation in order for [place] to try to keep things moving in the right direction and get this closed. This was all  discussed with the patient today. With that being said I know he wants to avoid a below-knee amputation which is what has previously been recommended. 03-21-2022 upon evaluation today patient appears to be doing well in regard to the medial wound at this is shown signs of excellent improvement. In regard to the lateral wound this is a bit dry compared to what I want to see him Regino Schultze not draining as much but I think that we need to see what we can do about trying to keep this from drying out and keeping it a bit more moist. Patient voiced understanding. Overall I am very pleased otherwise with where we stand. Objective Constitutional Well-nourished and well-hydrated in no acute distress. [name], [name] ([id number]) Vitals Time Taken: 1:19 PM, Height: 70 in, Weight: 180.4 lbs, BMI: 25.9, Temperature: 98.3 F, Pulse: 72 bpm, Respiratory Rate: 16 breaths/min, Blood Pressure: 128/72 mmHg. Respiratory normal breathing without difficulty. Psychiatric this patient is able to make decisions and demonstrates good insight into disease process. Alert and Oriented x 3. pleasant and cooperative. General Notes: Upon inspection patient's wound bed actually showed signs of good granulation epithelization at this point. Fortunately that is on the medial aspect. On the lateral aspect the area is very dry including the bone this is not good and I need to do something to try to keep this from drying out. We may continue with the Xeroform again to put hydrogel down first however to try to prevent this drying. Integumentary (Hair, Skin) Wound #1 status is Open. Original cause of wound was Surgical Injury. The date acquired was: 09/09/2021. The wound has been in treatment 17 weeks. 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 The wound measures 2.2cm length x 0.6cm width x 0.7cm depth; 1.037cm^2 area and 0.726cm^3 volume. There is Fat  Layer (Subcutaneous Tissue) exposed. There is no tunneling or  undermining noted. There is a medium amount of serosanguineous drainage noted. There is large (67-100%) red, pink granulation within the wound bed. There is a small (1-33%) amount of necrotic tissue within the wound bed including Adherent Slough. Wound #2 status is Open. Original cause of wound was Blister. The date acquired was: 10/28/2021. The wound has been in treatment 16 weeks. The wound is located on the Left,Lateral Foot. The wound measures 4cm length x 1.5cm width x 1.4cm depth; 4.712cm^2 area and 6.597cm^3 volume. There is bone, joint, muscle, tendon, and Fat Layer (Subcutaneous Tissue) exposed. There is no tunneling or undermining noted. There is a medium amount of serosanguineous drainage noted. There is small (1-33%) red granulation within the wound bed. There is a large (67-100%) amount of necrotic tissue within the wound bed including Eschar, Adherent Slough and Necrosis of Muscle. 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 - provider visits at wound center Return Appointment in: - HBO- (daily Monday -Friday for HBO Treatments) Home Health: [facility] for wound care. May utilize formulary equivalent dressing for wound treatment orders unless otherwise specified. Home Health Nurse may visit PRN to address patient s wound care needs. Scheduled days for dressing changes to be completed; exception, patient has scheduled wound care visit that day. **Please direct any NON-WOUND related  issues/requests for orders to patient's Primary Care Physician. **If current dressing causes regression in wound condition, may D/C ordered dressing product/s and apply Normal Saline Moist Dressing daily until next [place] or Other MD appointment. **Notify Wound Healing Center of regression in wound condition at [phone]. Bathing/ Shower/ Hygiene: Clean wound with Normal Saline or wound cleanser. May shower with wound dressing protected with water repellent cover or cast protector. No tub bath. Cellular or Tissue Based Products: Wound #2 Left,Lateral Foot: Cellular or Tissue Based Product applied to wound bed; including contact layer, fixation with steri-strips, dry gauze and cover dressing. (DO NOT REMOVE). Edema Control - Lymphedema / Segmental Compressive Device / Other: Elevate, Exercise Daily and Avoid Standing for Long Periods of Time. Elevate legs to the level of the heart and pump ankles as often as possible Elevate leg(s) parallel to the floor when sitting. Off-Loading: Foot Defender  - size large [name], [name] ([id number]) Additional Orders / Instructions: Follow Nutritious Diet and Increase Protein Intake Other: - Follow vascular, nephrology and PCP for wound care and other needs Dialysis appt as scheduled M-W-F Dr Amalia Hailey 03/31/22 Hyperbaric Oxygen Therapy: Indication and location: - Left 1st and 2nd Ray Amputation osteomyelitis and left lateral foot osteomyelitis. I added treatments onto the original regimen to account for the lateral foot osteomyelitis. If appropriate for treatment, begin HBOT per protocol: 2.0 ATA for 90 Minutes without Air Breaks One treatment per day (delivered Monday through Friday unless otherwise specified in Special Instructions below): Total # of Treatments: - 100 Finger stick Blood Glucose Pre- and Post- HBOT Treatment. Follow Hyperbaric Oxygen Glycemia Protocol WOUND #1: - Amputation Site - Digit Wound Laterality:  Left Cleanser: Wound Cleanser 3 x Per Week/30 Days Discharge Instructions:  Wash your hands with soap and water. Remove old dressing, discard into plastic bag and place into trash. Cleanse the wound with Wound Cleanser prior to applying a clean dressing using gauze sponges, not tissues or cotton balls. Do not scrub or use excessive force. Pat dry using gauze sponges, not tissue or cotton balls. Primary Dressing: Prisma 4.34 (in) 3 x Per Week/30 Days Discharge Instructions: Moisten w/normal saline or sterile water; Cover wound as directed. Do not remove from wound bed. Secured With: Medipore Tape - 5M Medipore H Soft Cloth Surgical Tape, 2x2 (in/yd) 3 x Per Week/30 Days Secured With: [employer] Sterile or Non-Sterile 6-ply 4.5x4 (yd/yd) 3 x Per Week/30 Days Discharge Instructions: Apply Kerlix as directed Secured With: ABD pad 3 x Per Week/30 Days WOUND #2: - Foot Wound Laterality: Left, Lateral Cleanser: Wound Cleanser 3 x Per Week/30 Days Discharge Instructions: Wash your hands with soap and water. Remove old dressing, discard into plastic bag and place into trash. Cleanse the wound with Wound Cleanser prior to applying a clean dressing using gauze sponges, not tissues or cotton balls. Do not scrub or use excessive force. Pat dry using gauze sponges, not tissue or cotton balls. Primary Dressing: Xeroform 4x4-HBD (in/in) 3 x Per Week/30 Days Discharge Instructions: Apply Xeroform 4x4-HBD (in/in) as directed Secondary Dressing: ABD Pad 5x9 (in/in) 3 x Per Week/30 Days Discharge Instructions: Cover with ABD pad Secondary Dressing: Gauze 3 x Per Week/30 Days Discharge Instructions: moistened with surgical lube Secondary Dressing: Kerlix 4.5 x 4.1 (in/yd) 3 x Per Week/30 Days Discharge Instructions: Apply Kerlix 4.5 x 4.1 (in/yd) as instructed Secured With: Medipore Tape - 5M Medipore H Soft Cloth Surgical Tape, 2x2 (in/yd) 3 x Per Week/30 Days 1. Would recommend currently that we would not  continue with the wound care measures as before and the patient is in agreement with plan this includes the use of the Xeroform gauze with hydrogel underneath on the lateral aspect wound. 2. Also can recommend that we continue with collagen to the medial aspect. 3. I would also suggest the patient continue to change this frequently. Does state if it staying to dry on the lateral aspect he may need to do this daily to keep it well-hydrated. 4. The patient does have an appointment with Dr. Amalia Hailey on 12 June. That is 10 days from now. We will see patient back for reevaluation in 1 week here in the clinic. If anything worsens or changes patient will contact our office for additional recommendations. Electronic Signature(s) Signed: 03/21/2022 2:13:06 PM By: Worthy Keeler PA-C Entered By: Worthy Keeler on 03/21/2022 14:13:05 [place], [place] ([id number]) -------------------------------------------------------------------------------- SuperBill Details Patient Name: [place], [name] Date of Service: 03/21/2022 Medical Record Number: [id number] Patient Account Number: [id number] Date of Birth/Sex: [birth year] (56 y.o. 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 R26.89 Other abnormalities of gait and mobility Facility Procedures CPT4 Code: [id number] Description: 77412 - WOUND CARE VISIT-LEV 2 EST PT Modifier: Quantity:  1 Physician Procedures CPT4 Code: [id number] Description: 99214 - WC PHYS LEVEL 4 -  EST PT Modifier: Quantity: 1 CPT4 Code: Description: ICD-10 Diagnosis Description E11.621 Type 2 diabetes mellitus with foot ulcer L97.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

## 2022-03-21 NOTE — Progress Notes (Addendum)
[name], [name] ([id number]) Visit Report for 03/21/2022 Arrival Information Details Patient Name: [name], [name]. Date of Service: 03/21/2022 1:15 PM Medical Record Number: [id number] Patient Account Number: [id number] Date of Birth/Sex: 06-02-1966 (56 y.o. M) Treating RN: [name] Primary Care Taniah Reinecke: [name] Other Clinician: Referring Marygrace Sandoval: [name] Treating Kla Bily/Extender: [name] in Treatment: 20 Visit Information History Since Last Visit All ordered tests and consults were completed: No Patient Arrived: Crutches Added or deleted any medications: No Arrival Time: 13:17 Any new allergies or adverse reactions: No Accompanied By: self Had a fall or experienced change in No Transfer Assistance: None activities of daily living that may affect Patient Identification Verified: Yes risk of falls: Secondary Verification Process Completed: Yes Signs or symptoms of abuse/neglect since last visito No Patient Requires Transmission-Based No Hospitalized since last visit: No Precautions: Implantable device outside of the clinic excluding No Patient Has Alerts: Yes cellular tissue based products placed in the center Patient Alerts: Patient on Blood since last visit: Thinner Has Dressing in Place as Prescribed: Yes Pain Present Now: Yes Electronic Signature(s) Signed: 03/21/2022 4:26:56 PM By: [name] RN Entered By: [name] on 03/21/2022 13:19:14 [name], [name] ([id number]) -------------------------------------------------------------------------------- Clinic Level of Care Assessment Details Patient Name: [name], [name]. Date of Service: 03/21/2022 1:15 PM Medical Record Number: [id number] Patient Account Number: [id number] Date of Birth/Sex: 14-Nov-1965 (56 y.o. M) Treating RN: [name] Primary Care Sully Dyment: [name] Other Clinician: Referring Jeanean Hollett: [name] Treating Ismael Treptow/Extender: [name] in Treatment:  20 Clinic Level of Care Assessment Items TOOL 4 Quantity Score X - Use when only an EandM is performed on FOLLOW-UP visit 1 0 ASSESSMENTS - Nursing Assessment / Reassessment X - Reassessment of Co-morbidities (includes updates in patient status) 1 10 X- 1 5 Reassessment of Adherence to Treatment Plan ASSESSMENTS - Wound and Skin Assessment / Reassessment X - Simple Wound Assessment / Reassessment - one wound 1 5 '[]'  - 0 Complex Wound Assessment / Reassessment - multiple wounds '[]'  - 0 Dermatologic / Skin Assessment (not related to wound area) ASSESSMENTS - Focused Assessment '[]'  - Circumferential Edema Measurements - multi extremities 0 '[]'  - 0 Nutritional Assessment / Counseling / Intervention '[]'  - 0 Lower Extremity Assessment (monofilament, tuning fork, pulses) '[]'  - 0 Peripheral Arterial Disease Assessment (using hand held doppler) ASSESSMENTS - Ostomy and/or Continence Assessment and Care '[]'  - Incontinence Assessment and Management 0 '[]'  - 0 Ostomy Care Assessment and Management (repouching, etc.) PROCESS - Coordination of Care X - Simple Patient / Family Education for ongoing care 1 15 '[]'  - 0 Complex (extensive) Patient / Family Education for ongoing care '[]'  - 0 Staff obtains Programmer, systems, Records, Test Results / Process Orders '[]'  - 0 Staff telephones HHA, Nursing Homes / Clarify orders / etc '[]'  - 0 Routine Transfer to another Facility (non-emergent condition) '[]'  - 0 Routine Hospital Admission (non-emergent condition) '[]'  - 0 New Admissions / Biomedical engineer / Ordering NPWT, Apligraf, etc. '[]'  - 0 Emergency Hospital Admission (emergent condition) X- 1 10 Simple Discharge Coordination '[]'  - 0 Complex (extensive) Discharge Coordination PROCESS - Special Needs '[]'  - Pediatric / Minor Patient Management 0 '[]'  - 0 Isolation Patient Management '[]'  - 0 Hearing / Language / Visual special needs '[]'  - 0 Assessment of Community assistance (transportation, D/C planning,  etc.) '[]'  - 0 Additional assistance / Altered mentation '[]'  - 0 Support Surface(s) Assessment (bed, cushion, seat, etc.) INTERVENTIONS - Wound Cleansing / Measurement [name], [name]. ([id number]) X-  1 5 Simple Wound Cleansing - one wound '[]'  - 0 Complex Wound Cleansing - multiple wounds X- 1 5 Wound Imaging (photographs - any number of wounds) '[]'  - 0 Wound Tracing (instead of photographs) X- 1 5 Simple Wound Measurement - one wound '[]'  - 0 Complex Wound Measurement - multiple wounds INTERVENTIONS - Wound Dressings X - Small Wound Dressing one or multiple wounds 1 10 '[]'  - 0 Medium Wound Dressing one or multiple wounds '[]'  - 0 Large Wound Dressing one or multiple wounds '[]'  - 0 Application of Medications - topical '[]'  - 0 Application of Medications - injection INTERVENTIONS - Miscellaneous '[]'  - External ear exam 0 '[]'  - 0 Specimen Collection (cultures, biopsies, blood, body fluids, etc.) '[]'  - 0 Specimen(s) / Culture(s) sent or taken to Lab for analysis '[]'  - 0 Patient Transfer (multiple staff / Civil Service fast streamer / Similar devices) '[]'  - 0 Simple Staple / Suture removal (25 or less) '[]'  - 0 Complex Staple / Suture removal (26 or more) '[]'  - 0 Hypo / Hyperglycemic Management (close monitor of Blood Glucose) '[]'  - 0 Ankle / Brachial Index (ABI) - do not check if billed separately X- 1 5 Vital Signs Has the patient been seen at the hospital within the last three years: Yes Total Score: 75 Level Of Care: New/Established - Level 2 Electronic Signature(s) Signed: 03/21/2022 4:26:56 PM By: [name] RN Entered By: [name] on 03/21/2022 14:04:56 [name], [name]. ([id number]) -------------------------------------------------------------------------------- Encounter Discharge Information Details Patient Name: [name], [name]. Date of Service: 03/21/2022 1:15 PM Medical Record Number: [id number] Patient Account Number: [id number] Date of Birth/Sex: 1966-04-13 (56 y.o. M) Treating RN: [name] Primary Care Cort Dragoo: [name] Other Clinician: Referring Latiqua Daloia: [name] Treating Katria Botts/Extender: [name] in Treatment: 20 Encounter Discharge Information Items Discharge Condition: Stable Ambulatory Status: Ambulatory Discharge Destination: Home Transportation: Private Auto Accompanied By: self Schedule Follow-up Appointment: Yes Clinical Summary of Care: Electronic Signature(s) Signed: 03/21/2022 4:26:56 PM By: [name] RN Entered By: [name] on 03/21/2022 14:07:58 Blatz, [name]. ([id number]) -------------------------------------------------------------------------------- Lower Extremity Assessment Details Patient Name: Vandunk, [name]. Date of Service: 03/21/2022 1:15 PM Medical Record Number: [id number] Patient Account Number: [id number] Date of Birth/Sex: [birth year] (56 y.o. M) Treating RN: [name] Primary Care Conlan Miceli: [name] Other Clinician: Referring Ziomara Birenbaum: [name] Treating Georgena Weisheit/Extender: [name] in Treatment: 20 Electronic Signature(s) Signed: 03/21/2022 4:26:56 PM By: [name] RN Entered By: [name] on 03/21/2022 13:27:46 Stemler, Tiwan [name]. ([id number]) -------------------------------------------------------------------------------- Multi Wound Chart Details Patient Name: Brittian, [name]. Date of Service: 03/21/2022 1:15 PM Medical Record Number: [id number] Patient Account Number: [id number] Date of Birth/Sex: [birth year] (56 y.o. 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 Comorbid History: Cataracts, Anemia, Sleep Apnea, Cataracts, Anemia, Sleep Apnea, N/A Arrhythmia, Congestive Heart Arrhythmia, Congestive Heart Failure, Coronary Artery Disease, Failure, Coronary Artery Disease, Hypertension, Myocardial Infarction, Hypertension, Myocardial Infarction, Type II Diabetes, Neuropathy, Type II Diabetes, Neuropathy, Seizure Disorder Seizure Disorder Date Acquired: 09/09/2021 10/28/2021 N/A Weeks of Treatment: 17 16 N/A Wound Status: Open Open N/A Wound Recurrence: No No N/A Measurements [name]  x W x D (cm) 2.2x0.6x0.7 4x1.5x1.4 N/A Area (cm) : 1.037 4.712 N/A Volume (cm) : 0.726 6.597 N/A % Reduction in Area: 92.70% -50.00% N/A % Reduction in Volume: 98.20% -2001.00% N/A Classification: Full Thickness With Exposed Grade 2 N/A Support Structures Exudate Amount: Medium Medium N/A Exudate Type: Serosanguineous Serosanguineous N/A Exudate Color: red, brown red, brown N/A Granulation Amount: Large (67-100%) Small (1-33%) N/A Granulation Quality: Red, Pink Red N/A Necrotic Amount: Small (1-33%) Large (67-100%) N/A Necrotic Tissue: Adherent [place] N/A Exposed Structures: Fat Layer (Subcutaneous Tissue): Fat Layer (Subcutaneous Tissue): N/A Yes Yes Fascia: No Tendon: Yes Tendon: No Muscle: Yes Muscle: No Joint: Yes Joint: No Bone: Yes Bone: No Fascia: No Epithelialization: None None N/A Treatment Notes Electronic Signature(s) Campanaro, Vimal [name]. ([id number]) Signed: 03/21/2022 4:26:56 PM By: [name] RN Entered By: [name] on 03/21/2022 13:28:56 Boerner, Ostrander ([id number]) -------------------------------------------------------------------------------- Dwight Details Patient Name: Penley, [name] MORELLA. Date of Service: 03/21/2022 1:15 PM Medical Record Number: [id number] Patient Account Number:  [id number] Date of Birth/Sex: Nov 04, 1965 (56 y.o. M) Treating RN: [name] Primary Care Glennice Marcos: [name] Other Clinician: Referring Brondon Wann: [name] Treating Cathrine Krizan/Extender: [name] in Treatment: 20 Active Inactive HBO Nursing Diagnoses: Anxiety related to feelings of confinement associated with the hyperbaric oxygen chamber Anxiety related to knowledge deficit of hyperbaric oxygen therapy and treatment procedures Discomfort related to temperature and humidity changes inside hyperbaric chamber Potential for barotraumas to ears, sinuses, teeth, and lungs or cerebral gas embolism related to changes in atmospheric pressure inside hyperbaric oxygen chamber Potential for oxygen toxicity seizures related to delivery of 100% oxygen at an increased atmospheric pressure Potential for pulmonary oxygen toxicity related to delivery of 100% oxygen at an increased atmospheric pressure Goals: Barotrauma will be prevented during HBO2 Date Initiated: 10/31/2021 Target Resolution Date: 12/06/2021 Goal Status: Active Patient and/or family will be able to state/discuss factors appropriate to the management of their disease process during treatment Date Initiated: 10/31/2021 Date Inactivated: 02/14/2022 Target Resolution Date: 11/15/2021 Goal Status: Met Patient will tolerate the hyperbaric oxygen therapy treatment Date Initiated: 10/31/2021 Target Resolution Date: 12/06/2021 Goal Status: Active Patient will tolerate the internal climate of the chamber Date Initiated: 10/31/2021 Date Inactivated: 02/14/2022 Target Resolution Date: 11/29/2021 Goal Status: Met Patient/caregiver will verbalize understanding of HBO goals, rationale, procedures and potential hazards Date Initiated: 10/31/2021 Date Inactivated: 02/14/2022 Target Resolution Date: 11/19/2021 Goal Status: Met Signs and symptoms of pulmonary oxygen toxicity will be recognized and promptly addressed Date Initiated:  10/31/2021 Date Inactivated: 02/14/2022 Target Resolution Date: 12/13/2021 Goal Status: Met Signs and symptoms of seizure will be recognized and promptly addressed ; seizing patients will suffer no harm Date Initiated: 10/31/2021 Target Resolution Date: 12/20/2021 Goal Status: Active Interventions: Administer a five (5) minute air break for patient if signs and symptoms of seizure appear and notify the hyperbaric physician Administer a ten (10) minute air break for patient if signs and symptoms of seizure appear and notify the hyperbaric physician Administer the correct therapeutic gas delivery based on the patients needs and limitations, per physician order Assess and provide for patientos comfort related to the hyperbaric environment and equalization of middle ear Assess for signs and symptoms related to adverse events, including but not limited to confinement anxiety, pneumothorax, oxygen toxicity and baurotrauma Assess patient for any history of confinement anxiety Assess patient's knowledge and expectations regarding hyperbaric medicine and provide education related to the hyperbaric environment, goals of treatment and prevention of adverse events Notes:  Necrotic Tissue Nursing Diagnoses: Impaired tissue integrity related to necrotic/devitalized tissue [place]goats, [name]. ([id number]) Knowledge deficit related to management of necrotic/devitalized tissue Goals: Necrotic/devitalized tissue will be minimized in the wound bed Date Initiated: 11/28/2021 Target Resolution Date: 11/28/2021 Goal Status: Active Patient/caregiver will verbalize understanding of reason and process for debridement of necrotic tissue Date Initiated: 11/28/2021 Date Inactivated: 02/14/2022 Target Resolution Date: 11/28/2021 Goal Status: Met Interventions: Assess patient pain level pre-, during and post procedure and prior to discharge Provide education on necrotic tissue and debridement process Treatment Activities: Apply  topical anesthetic as ordered : 11/28/2021 Excisional debridement : 11/28/2021 Notes: Wound/Skin Impairment Nursing Diagnoses: Impaired tissue integrity Knowledge deficit related to smoking impact on wound healing Knowledge deficit related to ulceration/compromised skin integrity Goals: Ulcer/skin breakdown will have a volume reduction of 30% by week 4 Date Initiated: 11/28/2021 Target Resolution Date: 12/12/2021 Goal Status: Active Interventions: Assess patient/caregiver ability to obtain necessary supplies Assess patient/caregiver ability to perform ulcer/skin care regimen upon admission and as needed Assess ulceration(s) every visit Treatment Activities: Topical wound management initiated : 11/28/2021 Notes: Electronic Signature(s) Signed: 03/21/2022 4:26:56 PM By: [name] RN Entered By: [name] on 03/21/2022 13:28:30 Cullens, [name]. ([id number]) -------------------------------------------------------------------------------- Pain Assessment Details Patient Name: Yambao, [name]. Date of Service: 03/21/2022 1:15 PM Medical Record Number: [id number] Patient Account Number: [id number] Date of Birth/Sex: 05-24-1966 (56 y.o. M) Treating RN: [name] Primary Care Geanette Buonocore: [name] Other Clinician: Referring Male Minish: [name] Treating Lekita Kerekes/Extender: [name] in Treatment: 20 Active Problems Location of Pain Severity and Description of Pain Patient Has Paino Yes Site Locations With Dressing Change: Yes Duration of the Pain. Constant / Intermittento Intermittent How Long Does it Lasto Hours: 1 Minutes: Rate the pain. Current Pain Level: 6 Worst Pain Level: 8 Least Pain Level: 0 Tolerable Pain Level: 5 Character of Pain Describe the Pain: Throbbing Pain Management and Medication Current Pain Management: Medication: Yes Cold Application: No Rest: Yes Massage: No Activity: No T.E.N.S.: No Heat Application: No Leg drop or elevation: No Is the  Current Pain Management Adequate: Inadequate How does your wound impact your activities of daily livingo Sleep: Yes Bathing: No Appetite: No Relationship With Others: No Bladder Continence: No Emotions: No Bowel Continence: No Work: No Toileting: No Drive: No Dressing: No Hobbies: No Electronic Signature(s) Signed: 03/21/2022 4:26:56 PM By: [name] RN Entered By: [name] on 03/21/2022 13:20:34 Yon, [name] ([id number]) -------------------------------------------------------------------------------- Patient/Caregiver Education Details Patient Name: Beebe, [name]. Date of Service: 03/21/2022 1:15 PM Medical Record Number: [id number] Patient Account Number: [id number] Date of Birth/Gender: May 04, 1966 (56 y.o. M) Treating RN: [name] Primary Care Physician: [name] Other Clinician: Referring Physician: [name] Treating Physician/Extender: [name] in Treatment: 20 Education Assessment Education Provided To: Patient Education Topics Provided Wound Debridement: Methods: Explain/Verbal Responses: State content correctly Electronic Signature(s) Signed: 03/21/2022 4:26:56 PM By: [name] RN Entered By: [name] on 03/21/2022 14:05:13 Van, Bond [name]. ([id number]) -------------------------------------------------------------------------------- Wound Assessment Details Patient Name: Kilner, [name]. Date of Service: 03/21/2022 1:15 PM Medical Record Number: [id number] Patient Account Number: [id number] Date of Birth/Sex: [birth year] (56 y.o. M) Treating RN: [name] Primary Care Helyn Schwan: [name] Other Clinician: Referring Tawana Pasch: [name] Treating Loyed Wilmes/Extender: [name] in Treatment: 20 Wound Status Wound Number: 1 Primary Open Surgical Wound Etiology: Wound Location: Left Amputation Site - Digit Wound Open Wounding Event: Surgical Injury Status: Date Acquired: 09/09/2021 Comorbid Cataracts, Anemia, Sleep  Apnea, Arrhythmia, Congestive Weeks Of Treatment: 17  History: Heart Failure, Coronary Artery Disease, Hypertension, Clustered Wound: No Myocardial Infarction, Type II Diabetes, Neuropathy, Seizure Disorder Photos Wound Measurements Length: (cm) 2.2 Width: (cm) 0.6 Depth: (cm) 0.7 Area: (cm) 1.037 Volume: (cm) 0.726 % Reduction in Area: 92.7% % Reduction in Volume: 98.2% Epithelialization: None Tunneling: No Undermining: No Wound Description Classification: Full Thickness With Exposed Support Structures Exudate Amount: Medium Exudate Type: Serosanguineous Exudate Color: red, brown Foul Odor After Cleansing: No Slough/Fibrino No Wound Bed Granulation Amount: Large (67-100%) Exposed Structure Granulation Quality: Red, Pink Fascia Exposed: No Necrotic Amount: Small (1-33%) Fat Layer (Subcutaneous Tissue) Exposed: Yes Necrotic Quality: Adherent Slough Tendon Exposed: No Muscle Exposed: No Joint Exposed: No Bone Exposed: No Treatment Notes Wound #1 (Amputation Site - Digit) Wound Laterality: Left Cleanser Wound Cleanser Discharge Instruction: Wash your hands with soap and water. Remove old dressing, discard into plastic bag and place into trash. Cleanse the wound with Wound Cleanser prior to applying a clean dressing using gauze sponges, not tissues or cotton balls. Do not Kissel, Grimes ([id number]) scrub or use excessive force. Pat dry using gauze sponges, not tissue or cotton balls. Peri-Wound Care Topical Primary Dressing Prisma 4.34 (in) Discharge Instruction: Moisten w/normal saline or sterile water; Cover wound as directed. Do not remove from wound bed. Secondary Dressing Secured With Medipore Tape - 87M Medipore H Soft Cloth Surgical Tape, 2x2 (in/yd) Kerlix Roll Sterile or Non-Sterile 6-ply 4.5x4 (yd/yd) Discharge Instruction: Apply Kerlix as directed ABD pad Compression Wrap Compression Stockings Add-Ons Electronic Signature(s) Signed: 03/21/2022 4:26:56  PM By: [name] RN Entered By: [name] on 03/21/2022 13:27:21 Milbrath, [name]. ([id number]) -------------------------------------------------------------------------------- Wound Assessment Details Patient Name: Sylla, [name]. Date of Service: 03/21/2022 1:15 PM Medical Record Number: [id number] Patient Account Number: [id number] Date of Birth/Sex: 1966-09-17 (56 y.o. M) Treating RN: [name] Primary Care Kurtiss [name]: [name] Other Clinician: Referring Klohe Lovering: [name] Treating Margean Korell/Extender: [name] in Treatment: 20 Wound Status Wound Number: 2 Primary Diabetic Wound/Ulcer of the Lower Extremity Etiology: Wound Location: Left, Lateral Foot Wound Open Wounding Event: Blister Status: Date Acquired: 10/28/2021 Comorbid Cataracts, Anemia, Sleep Apnea, Arrhythmia, Congestive Weeks Of Treatment: 16 History: Heart Failure, Coronary Artery Disease, Hypertension, Clustered Wound: No Myocardial Infarction, Type II Diabetes, Neuropathy, Seizure Disorder Photos Wound Measurements Length: (cm) 4 Width: (cm) 1.5 Depth: (cm) 1.4 Area: (cm) 4.712 Volume: (cm) 6.597 % Reduction in Area: -50% % Reduction in Volume: -2001% Epithelialization: None Tunneling: No Undermining: No Wound Description Classification: Grade 2 Exudate Amount: Medium Exudate Type: Serosanguineous Exudate Color: red, brown Foul Odor After Cleansing: No Slough/Fibrino Yes Wound Bed Granulation Amount: Small (1-33%) Exposed Structure Granulation Quality: Red Fascia Exposed: No Necrotic Amount: Large (67-100%) Fat Layer (Subcutaneous Tissue) Exposed: Yes Necrotic Quality: Eschar, Adherent Slough Tendon Exposed: Yes Muscle Exposed: Yes Necrosis of Muscle: Yes Joint Exposed: Yes Bone Exposed: Yes Treatment Notes Wound #2 (Foot) Wound Laterality: Left, Lateral Cleanser Wound Cleanser Discharge Instruction: Wash your hands with soap and water. Remove old dressing, discard  into plastic bag and place into trash. Cleanse the wound with Wound Cleanser prior to applying a clean dressing using gauze sponges, not tissues or cotton balls. Do not Sannes, [name] ([id number]) scrub or use excessive force. Pat dry using gauze sponges, not tissue or cotton balls. Peri-Wound Care Topical Primary Dressing Xeroform 4x4-HBD (in/in) Discharge Instruction: Apply Xeroform 4x4-HBD (in/in) as directed Secondary Dressing ABD Pad 5x9 (in/in) Discharge Instruction: Cover with ABD pad Gauze Discharge Instruction: moistened with surgical lube Kerlix 4.5  x 4.1 (in/yd) Discharge Instruction: Apply Kerlix 4.5 x 4.1 (in/yd) as instructed Secured With [place] H Soft Cloth Surgical Tape, 2x2 (in/yd) Compression Wrap Compression Stockings Add-Ons Electronic Signature(s) Signed: 03/21/2022 4:26:56 PM By: [name] RN Entered By: [name] on 03/21/2022 13:27:37 Sorbello, Silvester [name]. ([id number]) -------------------------------------------------------------------------------- Vitals Details Patient Name: Beidler, [name]. Date of Service: 03/21/2022 1:15 PM Medical Record Number: [id number] Patient Account Number: [id number] Date of Birth/Sex: June 18, 1966 (56 y.o. M) Treating RN: [name] Primary Care Terrez Ander: [name] Other Clinician: Referring Magaret Justo: [name] Treating Hamda Klutts/Extender: [name] in Treatment: 20 Vital Signs Time Taken: 13:19 Temperature (F): 98.3 Height (in): 70 Pulse (bpm): 72 Weight (lbs): 180.4 Respiratory Rate (breaths/min): 16 Body Mass Index (BMI): 25.9 Blood Pressure (mmHg): 128/72 Reference Range: 80 - 120 mg / dl Electronic Signature(s) Signed: 03/21/2022 4:26:56 PM By: [name] RN Entered By: [name] on 03/21/2022 13:19:46

## 2022-03-21 NOTE — Progress Notes (Signed)
[place], [place] ([id number]) Visit Report for 03/21/2022 HBO Details Patient Name: [name], [name] Date of Service: 03/21/2022 11:00 AM Medical Record Number: [id number] Patient Account Number: [id number] Date of Birth/Sex: 02/01/1966 (56 y.o. M) Treating RN: [name] Primary Care Ruthellen Tippy: [name] Other Clinician: Jacqulyn Bath Referring Kaloni Bisaillon: [name] Treating Bintou Lafata/Extender: [name] in Treatment: 20 HBO Treatment Course Details Treatment Course Number: 1 Ordering Lainey Nelson: Jeri Cos Total Treatments Ordered: 100 HBO Treatment Start Date: 11/04/2021 HBO Indication: Chronic Refractory Osteomyelitis to Left Foot HBO Treatment Details Treatment Number: 91 Patient Type: Outpatient Chamber Type: Monoplace Chamber Serial #: [id number] Treatment Protocol: 2.0 ATA with 90 minutes oxygen, and no air breaks Treatment Details Compression Rate Down: 1.0 psi / minute De-Compression Rate Up: 1.5 psi / minute Compress Tx Pressure Air breaks and breathing periods Decompress Decompress Begins Reached (leave unused spaces blank) Begins Ends Chamber Pressure (ATA) 1 2 - - - - - - 2 1 Clock Time (24 hr) 10:34 10:51 - - - - - - 12:23 12:33 Treatment Length: 119 (minutes) Treatment Segments: 4 Vital Signs Capillary Blood Glucose Reference Range: 80 - 120 mg / dl HBO Diabetic Blood Glucose Intervention Range: <131 mg/dl or >249 mg/dl Time Vitals Blood Respiratory Capillary Blood Glucose Pulse Action Type: Pulse: Temperature: Taken: Pressure: Rate: Glucose (mg/dl): Meter #: Oximetry (%) Taken: Pre 10:21 124/68 78 16 98.3 271 1 none per protocol Post 12:41 128/72 72 16 98.3 165 1 none per protocol Treatment Response Treatment Toleration: Well Treatment Completion Treatment Completed without Adverse Event Status: HBO Attestation I certify that I supervised this HBO treatment in accordance with Medicare guidelines. A trained emergency response team is readily  Yes available per hospital policies and procedures. Continue HBOT as ordered. Yes Electronic Signature(s) Signed: 03/21/2022 4:36:33 PM By: Worthy Keeler PA-C Previous Signature: 03/21/2022 1:54:34 PM Version By: Enedina Finner RCP, RRT, CHT Entered By: Worthy Keeler on 03/21/2022 16:36:32 [name], [name] ([id number]) -------------------------------------------------------------------------------- HBO Safety Checklist Details Patient Name: [name], [name] Date of Service: 03/21/2022 11:00 AM Medical Record Number: [id number] Patient Account Number: [id number] Date of Birth/Sex: Jan 09, 1966 (56 y.o. M) Treating RN: [name] Primary Care Quadasia Newsham: [name] Other Clinician: Jacqulyn Bath Referring Joane Postel: [name] Treating Mikeya Tomasetti/Extender: [name] in Treatment: 20 HBO Safety Checklist Items Safety Checklist Consent Form Signed Patient voided / foley secured and emptied When did you last eato 09:30 am Last dose of injectable or oral agent n/a Ostomy pouch emptied and vented if applicable NA All implantable devices assessed, documented and approved NA Intravenous access site secured and place NA Valuables secured Linens and cotton and cotton/polyester blend (less than 51% polyester) Personal oil-based products / skin lotions / body lotions removed Wigs or hairpieces removed NA Smoking or tobacco materials removed NA Books / newspapers / magazines / loose paper removed NA Cologne, aftershave, perfume and deodorant removed Jewelry removed (may wrap wedding band) Make-up removed NA Hair care products removed Battery operated devices (external) removed NA Heating patches and chemical warmers removed NA Titanium eyewear removed NA Nail polish cured greater than 10 hours NA Casting material cured greater than 10 hours NA Hearing aids removed NA Loose dentures or partials removed NA Prosthetics have been removed NA Patient  demonstrates correct use of air break device (if applicable) Patient concerns have been addressed Patient grounding bracelet on and cord attached to chamber Specifics for Inpatients (complete in addition to above) Medication sheet sent with patient Intravenous medications needed or  due during therapy sent with patient Drainage tubes (e.g. nasogastric tube or chest tube secured and vented) Endotracheal or Tracheotomy tube secured Cuff deflated of air and inflated with saline Airway suctioned Electronic Signature(s) Signed: 03/21/2022 1:54:34 PM By: Enedina Finner RCP, RRT, CHT Entered By: Enedina Finner on 03/21/2022 11:51:43

## 2022-03-21 NOTE — Progress Notes (Signed)
[place], [place] ([id number]) Visit Report for 03/21/2022 Arrival Information Details Patient Name: TREJON, DUFORD. Date of Service: 03/21/2022 11:00 AM Medical Record Number: [id number] Patient Account Number: [id number] Date of Birth/Sex: Mar 21, 1966 (56 y.o. M) Treating RN: [name] Primary Care Glada Wickstrom: [name] Other Clinician: Jacqulyn Bath Referring Lasasha Brophy: [name] Treating Tonye Tancredi/Extender: [name] in Treatment: 65 Visit Information History Since Last Visit Added or deleted any medications: No Patient Arrived: Ambulatory Any new allergies or adverse reactions: No Arrival Time: 10:15 Had a fall or experienced change in No Accompanied By: self activities of daily living that may affect Transfer Assistance: None risk of falls: Patient Requires Transmission-Based No Signs or symptoms of abuse/neglect since last visito No Precautions: Hospitalized since last visit: No Patient Has Alerts: Yes Implantable device outside of the clinic excluding No Patient Alerts: Patient on Blood cellular tissue based products placed in the center Thinner since last visit: Has Dressing in Place as Prescribed: Yes Has Footwear/Offloading in Place as Prescribed: Yes Left: Other:Defender Boot; crutches Pain Present Now: No Electronic Signature(s) Signed: 03/21/2022 1:54:34 PM By: Enedina Finner RCP, RRT, CHT Entered By: Stark Jock, Amado Nash on 03/21/2022 11:49:38 Perona, Lavante L. ([id number]) -------------------------------------------------------------------------------- Encounter Discharge Information Details Patient Name: Zacarias, Kameryn L. Date of Service: 03/21/2022 11:00 AM Medical Record Number: [id number] Patient Account Number: [id number] Date of Birth/Sex: June 04, 1966 (56 y.o. M) Treating RN: [name] Primary Care Preston Garabedian: [name] Other Clinician: Jacqulyn Bath Referring Torrell Krutz: [name] Treating Jaleigha Deane/Extender: [name] in Treatment: 20 Encounter Discharge Information Items Discharge Condition: Stable Ambulatory Status: Ambulatory Discharge Destination: Home Transportation: Private Auto Accompanied By: self Schedule Follow-up Appointment: Yes Clinical Summary of Care: Notes Patient has an HBO treatment scheduled on 03/24/22 at 11:00 am. Electronic Signature(s) Signed: 03/21/2022 1:54:34 PM By: Enedina Finner RCP, RRT, CHT Entered By: Stark Jock, Amado Nash on 03/21/2022 13:54:12 Sevigny, Chanan L. ([id number]) -------------------------------------------------------------------------------- Vitals Details Patient Name: Natal, Rad L. Date of Service: 03/21/2022 11:00 AM Medical Record Number: [id number] Patient Account Number: [id number] Date of Birth/Sex: 1966/08/20 (56 y.o. M) Treating RN: [name] Primary Care Haroldine Redler: [name] Other Clinician: Jacqulyn Bath Referring Jayquon Theiler: [name] Treating Dorothy Landgrebe/Extender: [name] in Treatment: 20 Vital Signs Time Taken: 10:21 Temperature (F): 98.3 Height (in): 70 Pulse (bpm): 78 Weight (lbs): 180.4 Respiratory Rate (breaths/min): 16 Body Mass Index (BMI): 25.9 Blood Pressure (mmHg): 124/68 Capillary Blood Glucose (mg/dl): 271 Reference Range: 80 - 120 mg / dl Electronic Signature(s) Signed: 03/21/2022 1:54:34 PM By: Enedina Finner RCP, RRT, CHT Entered By: Enedina Finner on 03/21/2022 11:50:12

## 2022-03-24 ENCOUNTER — Encounter: Payer: Medicare Other | Admitting: Physician Assistant

## 2022-03-24 DIAGNOSIS — L97524 Non-pressure chronic ulcer of other part of left foot with necrosis of bone: Secondary | ICD-10-CM | POA: Diagnosis not present

## 2022-03-24 DIAGNOSIS — N186 End stage renal disease: Secondary | ICD-10-CM | POA: Diagnosis not present

## 2022-03-24 DIAGNOSIS — M86672 Other chronic osteomyelitis, left ankle and foot: Secondary | ICD-10-CM | POA: Diagnosis not present

## 2022-03-24 DIAGNOSIS — N2581 Secondary hyperparathyroidism of renal origin: Secondary | ICD-10-CM | POA: Diagnosis not present

## 2022-03-24 DIAGNOSIS — E1122 Type 2 diabetes mellitus with diabetic chronic kidney disease: Secondary | ICD-10-CM | POA: Diagnosis not present

## 2022-03-24 DIAGNOSIS — Z5181 Encounter for therapeutic drug level monitoring: Secondary | ICD-10-CM | POA: Diagnosis not present

## 2022-03-24 DIAGNOSIS — I132 Hypertensive heart and chronic kidney disease with heart failure and with stage 5 chronic kidney disease, or end stage renal disease: Secondary | ICD-10-CM | POA: Diagnosis not present

## 2022-03-24 DIAGNOSIS — E11621 Type 2 diabetes mellitus with foot ulcer: Secondary | ICD-10-CM | POA: Diagnosis not present

## 2022-03-24 DIAGNOSIS — L02612 Cutaneous abscess of left foot: Secondary | ICD-10-CM | POA: Diagnosis not present

## 2022-03-24 DIAGNOSIS — D631 Anemia in chronic kidney disease: Secondary | ICD-10-CM | POA: Diagnosis not present

## 2022-03-24 DIAGNOSIS — Z992 Dependence on renal dialysis: Secondary | ICD-10-CM | POA: Diagnosis not present

## 2022-03-24 DIAGNOSIS — I96 Gangrene, not elsewhere classified: Secondary | ICD-10-CM | POA: Diagnosis not present

## 2022-03-24 LAB — GLUCOSE, CAPILLARY
Glucose-Capillary: 188 mg/dL — ABNORMAL HIGH (ref 70–99)
Glucose-Capillary: 148 mg/dL — ABNORMAL HIGH (ref 70–99)

## 2022-03-24 NOTE — Progress Notes (Addendum)
[place], [place] ([id number]) Visit Report for 03/24/2022 Arrival Information Details Patient Name: [name], [name]. Date of Service: 03/24/2022 11:00 AM Medical Record Number: [id number] Patient Account Number: [id number] Date of Birth/Sex: 1966-01-15 (56 y.o. M) Treating RN: [name] Primary Care Braylyn Eye: [name] Other Clinician: Donavan Burnet Referring Sharmeka Palmisano: [name] Treating Scarlette Hogston/Extender: [name] in Treatment: 55 Visit Information History Since Last Visit All ordered tests and consults were completed: Yes Patient Arrived: Ambulatory Added or deleted any medications: No Arrival Time: 10:19 Any new allergies or adverse reactions: No Accompanied By: self Had a fall or experienced change in No Transfer Assistance: None activities of daily living that may affect Patient Identification Verified: Yes risk of falls: Secondary Verification Process Completed: Yes Signs or symptoms of abuse/neglect since last visito No Patient Requires Transmission-Based No Hospitalized since last visit: No Precautions: Implantable device outside of the clinic excluding No Patient Has Alerts: Yes cellular tissue based products placed in the center Patient Alerts: Patient on Blood since last visit: Thinner Pain Present Now: No Electronic Signature(s) Signed: 03/24/2022 11:00:15 AM By: Donavan Burnet CHT, EMT, BS Entered By: Donavan Burnet on 03/24/2022 11:00:15 [name], [name] ([id number]) -------------------------------------------------------------------------------- Encounter Discharge Information Details Patient Name: [name], [name] Date of Service: 03/24/2022 11:00 AM Medical Record Number: [id number] Patient Account Number: [id number] Date of Birth/Sex: 1966-02-16 (56 y.o. M) Treating RN: [name] Primary Care Taelyn Broecker: [name] Other Clinician: Donavan Burnet Referring Arcangel Minion: [name] Treating Deondra Wigger/Extender: [name]  in Treatment: 20 Encounter Discharge Information Items Discharge Condition: Stable Ambulatory Status: Ambulatory Discharge Destination: Home Transportation: Private Auto Accompanied By: self Schedule Follow-up Appointment: No Clinical Summary of Care: Electronic Signature(s) Signed: 03/24/2022 2:15:40 PM By: Donavan Burnet CHT, EMT, BS Entered By: Donavan Burnet on 03/24/2022 14:15:40 [name], [name] ([id number]) -------------------------------------------------------------------------------- Vitals Details Patient Name: [name], [name] Date of Service: 03/24/2022 11:00 AM Medical Record Number: [id number] Patient Account Number: [id number] Date of Birth/Sex: 1965-12-27 (56 y.o. M) Treating RN: [name] Primary Care Kenniya Westrich: [name] Other Clinician: Donavan Burnet Referring Jaremy Nosal: [name] Treating Anastasio Wogan/Extender: [name] in Treatment: 20 Vital Signs Time Taken: 10:08 Temperature (F): 98.3 Height (in): 70 Pulse (bpm): 78 Weight (lbs): 180.4 Respiratory Rate (breaths/min): 16 Body Mass Index (BMI): 25.9 Blood Pressure (mmHg): 118/78 Capillary Blood Glucose (mg/dl): 188 Reference Range: 80 - 120 mg / dl Electronic Signature(s) Signed: 03/24/2022 11:01:40 AM By: Donavan Burnet CHT, EMT, BS Entered By: Donavan Burnet on 03/24/2022 11:01:40

## 2022-03-24 NOTE — Progress Notes (Addendum)
[place], [place] ([id number]) Visit Report for 03/24/2022 HBO Details Patient Name: [name], [name] Date of Service: 03/24/2022 11:00 AM Medical Record Number: [id number] Patient Account Number: [id number] Date of Birth/Sex: [birth year] (56 y.o. M) Treating RN: [name] Primary Care Saiya Crist: [name] Other Clinician: Donavan Burnet Referring Meggie Laseter: [name] Treating Bird Tailor/Extender: [name] in Treatment: 20 HBO Treatment Course Details Treatment Course Number: 1 Ordering Burel Kahre: Jeri Cos Total Treatments Ordered: 100 HBO Treatment Start Date: 11/04/2021 HBO Indication: Chronic Refractory Osteomyelitis to Left Foot HBO Treatment Details Treatment Number: 92 Patient Type: Outpatient Chamber Type: Monoplace Chamber Serial #: [id number] Treatment Protocol: 2.0 ATA with 90 minutes oxygen, and no air breaks Treatment Details Compression Rate Down: 1.5 psi / minute De-Compression Rate Up: 2.0 psi / minute Compress Tx Pressure Air breaks and breathing periods Decompress Decompress Begins Reached (leave unused spaces blank) Begins Ends Chamber Pressure (ATA) 1 2 - - - - - - 2 1 Clock Time (24 hr) 10:17 10:28 - - - - - - 11:58 12:08 Treatment Length: 111 (minutes) Treatment Segments: 4 Vital Signs Capillary Blood Glucose Reference Range: 80 - 120 mg / dl HBO Diabetic Blood Glucose Intervention Range: <131 mg/dl or >249 mg/dl Time Vitals Blood Respiratory Capillary Blood Glucose Pulse Action Type: Pulse: Temperature: Taken: Pressure: Rate: Glucose (mg/dl): Meter #: Oximetry (%) Taken: Pre 10:08 118/78 78 16 98.3 188 1 none per protocol Post 12:09 118/78 78 16 98.3 148 1 none per protocol Treatment Response Treatment Toleration: Well Treatment Completion Treatment Completed without Adverse Event Status: HBO Attestation I certify that I supervised this HBO treatment in accordance with Medicare guidelines. A trained emergency response team is readily  Yes available per hospital policies and procedures. Continue HBOT as ordered. Yes Electronic Signature(s) Signed: 03/24/2022 4:21:57 PM By: Worthy Keeler PA-C Previous Signature: 03/24/2022 2:20:29 PM Version By: Donavan Burnet CHT, EMT, BS Previous Signature: 03/24/2022 4:19:54 PM Version By: Worthy Keeler PA-C Previous Signature: 03/24/2022 2:14:16 PM Version By: Donavan Burnet CHT, EMT, BS Previous Signature: 03/24/2022 11:05:08 AM Version By: Donavan Burnet CHT, EMT, BS Entered By: Worthy Keeler on 03/24/2022 16:21:56 Chopp, Diyan L. ([id number]) -------------------------------------------------------------------------------- HBO Safety Checklist Details Patient Name: [name], [name] Date of Service: 03/24/2022 11:00 AM Medical Record Number: [id number] Patient Account Number: [id number] Date of Birth/Sex: 12/07/1965 (56 y.o. M) Treating RN: [name] Primary Care Yurani Fettes: [name] Other Clinician: Donavan Burnet Referring Taimane Stimmel: [name] Treating Lerae Langham/Extender: [name] in Treatment: 20 HBO Safety Checklist Items Safety Checklist Consent Form Signed Patient voided / foley secured and emptied When did you last eato am Last dose of injectable or oral agent N/A Ostomy pouch emptied and vented if applicable NA All implantable devices assessed, documented and approved NA Intravenous access site secured and place NA Valuables secured Linens and cotton and cotton/polyester blend (less than 51% polyester) Personal oil-based products / skin lotions / body lotions removed Wigs or hairpieces removed NA Smoking or tobacco materials removed NA Books / newspapers / magazines / loose paper removed Cologne, aftershave, perfume and deodorant removed Jewelry removed (may wrap wedding band) Make-up removed NA Hair care products removed Battery operated devices (external) removed Heating patches and chemical warmers removed Titanium eyewear  removed NA Nail polish cured greater than 10 hours NA Casting material cured greater than 10 hours NA Hearing aids removed NA Loose dentures or partials removed NA Prosthetics have been removed NA Patient demonstrates correct use of air break device (if applicable) Patient  concerns have been addressed Patient grounding bracelet on and cord attached to chamber Specifics for Inpatients (complete in addition to above) NA Medication sheet sent with patient Intravenous medications needed or due during therapy sent with patient NA Drainage tubes (e.g. nasogastric tube or chest tube secured and vented) NA Endotracheal or Tracheotomy tube secured NA Cuff deflated of air and inflated with saline NA Airway suctioned NA Notes Paper version used prior to treatment. Electronic Signature(s) Signed: 03/24/2022 11:03:02 AM By: Donavan Burnet CHT, EMT, BS Entered By: Donavan Burnet on 03/24/2022 11:03:01

## 2022-03-24 NOTE — Progress Notes (Signed)
[place]ia, [place] ([id number]) Visit Report for 03/24/2022 Problem List Details Patient Name: [name], [name]. Date of Service: 03/24/2022 11:00 AM Medical Record Number: [id number] Patient Account Number: [id number] Date of Birth/Sex: 04-02-1966 (56 y.o. 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 ([id number]) -------------------------------------------------------------------------------- SuperBill Details Patient Name: [name], [name] Date of Service: 03/24/2022 Medical Record Number: [id number] Patient Account Number: [id number] Date of Birth/Sex: 06/06/1966 (56 y.o. 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 R26.89 Other abnormalities of gait and mobility Facility Procedures CPT4 Code: 26378588 Description: (Facility Use Only) HBOT, full body chamber, 97min Modifier: Quantity: 4 CPT4 Code: Description: ICD-10 Diagnosis Description [phone] Other chronic osteomyelitis, left ankle and foot 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.621 Type 2 diabetes mellitus with foot ulcer Modifier: e Quantity: Physician Procedures CPT4 Code: 1287867 Description: 67209 - WC PHYS HYPERBARIC OXYGEN THERAPY Modifier: Quantity: 1 CPT4 Code: Description: ICD-10 Diagnosis Description M86.672 Other chronic osteomyelitis, left ankle and foot 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

## 2022-03-25 ENCOUNTER — Encounter: Payer: Medicare Other | Admitting: Physician Assistant

## 2022-03-25 DIAGNOSIS — E1122 Type 2 diabetes mellitus with diabetic chronic kidney disease: Secondary | ICD-10-CM | POA: Diagnosis not present

## 2022-03-25 DIAGNOSIS — E11621 Type 2 diabetes mellitus with foot ulcer: Secondary | ICD-10-CM | POA: Diagnosis not present

## 2022-03-25 DIAGNOSIS — D631 Anemia in chronic kidney disease: Secondary | ICD-10-CM | POA: Diagnosis not present

## 2022-03-25 DIAGNOSIS — E1151 Type 2 diabetes mellitus with diabetic peripheral angiopathy without gangrene: Secondary | ICD-10-CM | POA: Diagnosis not present

## 2022-03-25 DIAGNOSIS — L97524 Non-pressure chronic ulcer of other part of left foot with necrosis of bone: Secondary | ICD-10-CM | POA: Diagnosis not present

## 2022-03-25 DIAGNOSIS — M86672 Other chronic osteomyelitis, left ankle and foot: Secondary | ICD-10-CM | POA: Diagnosis not present

## 2022-03-25 DIAGNOSIS — I120 Hypertensive chronic kidney disease with stage 5 chronic kidney disease or end stage renal disease: Secondary | ICD-10-CM | POA: Diagnosis not present

## 2022-03-25 DIAGNOSIS — I132 Hypertensive heart and chronic kidney disease with heart failure and with stage 5 chronic kidney disease, or end stage renal disease: Secondary | ICD-10-CM | POA: Diagnosis not present

## 2022-03-25 DIAGNOSIS — Z4781 Encounter for orthopedic aftercare following surgical amputation: Secondary | ICD-10-CM | POA: Diagnosis not present

## 2022-03-25 DIAGNOSIS — N186 End stage renal disease: Secondary | ICD-10-CM | POA: Diagnosis not present

## 2022-03-25 LAB — GLUCOSE, CAPILLARY
Glucose-Capillary: 154 mg/dL — ABNORMAL HIGH (ref 70–99)
Glucose-Capillary: 194 mg/dL — ABNORMAL HIGH (ref 70–99)

## 2022-03-25 NOTE — Progress Notes (Signed)
[place], [place] ([id number]) Visit Report for 03/25/2022 HBO Safety Checklist Details Patient Name: [name], [name] Date of Service: 03/25/2022 8:00 AM Medical Record Number: [id number] Patient Account Number: [id number] Date of Birth/Sex: 1965/11/11 (56 y.o. M) Treating RN: [name] Primary Care Treasure Ingrum: [name] Other Clinician: Donavan Burnet Referring Shepherd Finnan: [name] Treating Bitha Fauteux/Extender: [name] in Treatment: 20 HBO Safety Checklist Items Safety Checklist Consent Form Signed Patient voided / foley secured and emptied When did you last eato 0730 Last dose of injectable or oral agent N/A Ostomy pouch emptied and vented if applicable NA All implantable devices assessed, documented and approved NA Intravenous access site secured and place NA Valuables secured Linens and cotton and cotton/polyester blend (less than 51% polyester) Personal oil-based products / skin lotions / body lotions removed Wigs or hairpieces removed NA Smoking or tobacco materials removed NA Books / newspapers / magazines / loose paper removed Cologne, aftershave, perfume and deodorant removed Jewelry removed (may wrap wedding band) Make-up removed Hair care products removed Battery operated devices (external) removed Heating patches and chemical warmers removed Titanium eyewear removed NA Nail polish cured greater than 10 hours NA Casting material cured greater than 10 hours NA Hearing aids removed NA Loose dentures or partials removed NA Prosthetics have been removed NA Patient demonstrates correct use of air break device (if applicable) Patient concerns have been addressed Patient grounding bracelet on and cord attached to chamber Specifics for Inpatients (complete in addition to above) NA Medication sheet sent with patient Intravenous medications needed or due during therapy sent with patient NA Drainage tubes (e.g. nasogastric tube or chest tube  secured and vented) NA Endotracheal or Tracheotomy tube secured NA Cuff deflated of air and inflated with saline NA Airway suctioned NA Notes Paper version used prior to treatment. Electronic Signature(s) Signed: 03/25/2022 9:00:51 AM By: Donavan Burnet CHT, EMT, BS Entered By: Donavan Burnet on 03/25/2022 09:00:50

## 2022-03-25 NOTE — Progress Notes (Addendum)
[name], [place] ([id number]) Visit Report for 03/25/2022 Arrival Information Details Patient Name: [name], [name]. Date of Service: 03/25/2022 8:00 AM Medical Record Number: [id number] Patient Account Number: [id number] Date of Birth/Sex: 06-Aug-1966 (56 y.o. M) Treating RN: [name] Primary Care Latunya Kissick: [name] Other Clinician: Donavan Burnet Referring Marabelle Cushman: [name] Treating Riya Huxford/Extender: [name] in Treatment: 51 Visit Information History Since Last Visit All ordered tests and consults were completed: Yes Patient Arrived: Ambulatory Added or deleted any medications: No Arrival Time: 08:53 Any new allergies or adverse reactions: No Accompanied By: self Had a fall or experienced change in No Transfer Assistance: None activities of daily living that may affect Patient Identification Verified: Yes risk of falls: Secondary Verification Process Completed: Yes Signs or symptoms of abuse/neglect since last visito No Patient Requires Transmission-Based No Hospitalized since last visit: No Precautions: Implantable device outside of the clinic excluding No Patient Has Alerts: Yes cellular tissue based products placed in the center Patient Alerts: Patient on Blood since last visit: Thinner Pain Present Now: No Electronic Signature(s) Signed: 03/25/2022 8:58:50 AM By: Donavan Burnet CHT, EMT, BS Entered By: Donavan Burnet on 03/25/2022 08:58:50 Dorce, Rogers L. ([id number]) -------------------------------------------------------------------------------- Encounter Discharge Information Details Patient Name: [name], [name] Date of Service: 03/25/2022 8:00 AM Medical Record Number: [id number] Patient Account Number: [id number] Date of Birth/Sex: 10/27/1965 (56 y.o. M) Treating RN: [name] Primary Care English Craighead: [name] Other Clinician: Donavan Burnet Referring Yazan Gatling: [name] Treating Anil Havard/Extender: [name] in  Treatment: 20 Encounter Discharge Information Items Discharge Condition: Stable Ambulatory Status: Ambulatory Discharge Destination: Home Transportation: Private Auto Accompanied By: self Schedule Follow-up Appointment: No Clinical Summary of Care: Electronic Signature(s) Signed: 03/25/2022 11:40:59 AM By: Donavan Burnet CHT, EMT, BS Entered By: Donavan Burnet on 03/25/2022 11:40:59 [place], [place] ([id number]) -------------------------------------------------------------------------------- Vitals Details Patient Name: [name], [name] Date of Service: 03/25/2022 8:00 AM Medical Record Number: [id number] Patient Account Number: [id number] Date of Birth/Sex: Oct 27, 1965 (56 y.o. M) Treating RN: [name] Primary Care Tesla Keeler: [name] Other Clinician: Donavan Burnet Referring Bryceson Grape: [name] Treating Madalynne Gutmann/Extender: [name] in Treatment: 20 Vital Signs Time Taken: 07:56 Temperature (F): 98.1 Height (in): 70 Pulse (bpm): 78 Weight (lbs): 180.4 Respiratory Rate (breaths/min): 14 Body Mass Index (BMI): 25.9 Blood Pressure (mmHg): 120/78 Capillary Blood Glucose (mg/dl): 154 Reference Range: 80 - 120 mg / dl Electronic Signature(s) Signed: 03/25/2022 8:59:28 AM By: Donavan Burnet CHT, EMT, BS Entered By: Donavan Burnet on 03/25/2022 08:59:28

## 2022-03-26 ENCOUNTER — Encounter: Payer: Medicare Other | Admitting: Physician Assistant

## 2022-03-26 DIAGNOSIS — D631 Anemia in chronic kidney disease: Secondary | ICD-10-CM | POA: Diagnosis not present

## 2022-03-26 DIAGNOSIS — E1122 Type 2 diabetes mellitus with diabetic chronic kidney disease: Secondary | ICD-10-CM | POA: Diagnosis not present

## 2022-03-26 DIAGNOSIS — I96 Gangrene, not elsewhere classified: Secondary | ICD-10-CM | POA: Diagnosis not present

## 2022-03-26 DIAGNOSIS — Z992 Dependence on renal dialysis: Secondary | ICD-10-CM | POA: Diagnosis not present

## 2022-03-26 DIAGNOSIS — N2581 Secondary hyperparathyroidism of renal origin: Secondary | ICD-10-CM | POA: Diagnosis not present

## 2022-03-26 DIAGNOSIS — N186 End stage renal disease: Secondary | ICD-10-CM | POA: Diagnosis not present

## 2022-03-26 DIAGNOSIS — I132 Hypertensive heart and chronic kidney disease with heart failure and with stage 5 chronic kidney disease, or end stage renal disease: Secondary | ICD-10-CM | POA: Diagnosis not present

## 2022-03-26 DIAGNOSIS — E11621 Type 2 diabetes mellitus with foot ulcer: Secondary | ICD-10-CM | POA: Diagnosis not present

## 2022-03-26 DIAGNOSIS — L97524 Non-pressure chronic ulcer of other part of left foot with necrosis of bone: Secondary | ICD-10-CM | POA: Diagnosis not present

## 2022-03-26 DIAGNOSIS — M86672 Other chronic osteomyelitis, left ankle and foot: Secondary | ICD-10-CM | POA: Diagnosis not present

## 2022-03-26 LAB — GLUCOSE, CAPILLARY
Glucose-Capillary: 227 mg/dL — ABNORMAL HIGH (ref 70–99)
Glucose-Capillary: 206 mg/dL — ABNORMAL HIGH (ref 70–99)

## 2022-03-26 NOTE — Progress Notes (Addendum)
[place], [place] ([id number]) Visit Report for 03/26/2022 HBO Details Patient Name: Bober, Monti L. Date of Service: 03/26/2022 11:00 AM Medical Record Number: [id number] Patient Account Number: [id number] Date of Birth/Sex: 06-18-1966 (56 y.o. 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 HBO Treatment Details Treatment Number: 94 Patient Type: Outpatient Chamber Type: Monoplace Chamber Serial #: [id number] Treatment Protocol: 2.0 ATA with 90 minutes oxygen, and no air breaks Treatment Details Compression Rate Down: 2.0 psi / minute De-Compression Rate Up: 2.0 psi / minute Compress Tx Pressure Air breaks and breathing periods Decompress Decompress Begins Reached (leave unused spaces blank) Begins Ends Chamber Pressure (ATA) 1 2 - - - - - - 2 1 Clock Time (24 hr) 09:52 10:03 - - - - - - 11:33 11:41 Treatment Length: 109 (minutes) Treatment Segments: 4 Vital Signs Capillary Blood Glucose Reference Range: 80 - 120 mg / dl HBO Diabetic Blood Glucose Intervention Range: <131 mg/dl or >249 mg/dl Time Vitals Blood Respiratory Capillary Blood Glucose Pulse Action Type: Pulse: Temperature: Taken: Pressure: Rate: Glucose (mg/dl): Meter #: Oximetry (%) Taken: Pre 09:43 110/68 72 18 98 227 1 none per protocol Post 11:45 118/68 78 18 98 206 1 none per protocol Treatment Response Treatment Toleration: Well Treatment Completion Treatment Completed without Adverse Event Status: Electronic Signature(s) Signed: 03/26/2022 12:21:09 PM By: Donavan Burnet CHT, EMT, BS Signed: 03/27/2022 4:11:43 PM By: Worthy Keeler PA-C Entered  By: Donavan Burnet on 03/26/2022 12:21:08 Holle, Gradie L. ([id number]) -------------------------------------------------------------------------------- HBO Safety Checklist Details Patient Name: Pagel, Jayson L. Date of Service: 03/26/2022 11:00 AM Medical Record Number: [id number] Patient Account Number: [id number] Date of Birth/Sex: 01-Jan-1966 (56 y.o. M) Treating RN: [name] Primary Care Arkel Cartwright: [name] Other Clinician: Donavan Burnet Referring Rochelle Larue: [name] Treating Annibelle Brazie/Extender: [name] in Treatment: 20 HBO Safety Checklist Items Safety Checklist Consent Form Signed Patient voided / foley secured and emptied When did you last eato am Last dose of injectable or oral agent Ostomy pouch emptied and vented if applicable NA All implantable devices assessed, documented and approved NA Intravenous access site secured and place NA Valuables secured Linens and cotton and cotton/polyester blend (less than 51% polyester) Personal oil-based products / skin lotions / body lotions removed Wigs or hairpieces removed NA Smoking or tobacco materials removed NA Books / newspapers / magazines / loose paper removed Cologne, aftershave, perfume and deodorant removed Jewelry removed (may wrap wedding band) Make-up removed NA Hair care products removed Battery operated devices (external) removed Heating patches and chemical warmers removed Titanium eyewear removed NA Nail polish cured greater than 10 hours NA Casting material cured greater than 10 hours NA Hearing aids removed NA Loose dentures or partials removed NA Prosthetics have been removed NA Patient demonstrates correct use of air break device (if applicable) Patient concerns have been addressed Patient grounding bracelet on and cord attached to chamber Specifics for Inpatients (complete in addition to above) NA Medication sheet sent with patient Intravenous medications needed or  due during therapy sent with patient NA Drainage tubes (e.g. nasogastric tube or chest tube secured and vented) NA Endotracheal or Tracheotomy tube secured NA Cuff deflated of air and inflated with saline NA Airway suctioned NA Notes Paper version used prior  to treatment. Electronic Signature(s) Signed: 03/26/2022 11:40:29 AM By: Donavan Burnet CHT, EMT, BS Entered By: Donavan Burnet on 03/26/2022 11:40:29

## 2022-03-26 NOTE — Progress Notes (Signed)
[place], [place] ([id number]) Visit Report for 03/25/2022 Problem List Details Patient Name: [name], [name]. Date of Service: 03/25/2022 8:00 AM Medical Record Number: [id number] Patient Account Number: [id number] Date of Birth/Sex: [birth year] (56 y.o. 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 ([id number]) -------------------------------------------------------------------------------- SuperBill Details Patient Name: [name], [name] Date of Service: 03/25/2022 Medical Record Number: [id number] Patient Account Number: [id number] Date of Birth/Sex: 10/16/1966 (56 y.o. 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 R26.89 Other abnormalities of gait and mobility Facility Procedures CPT4 Code: 46803212 Description: (Facility Use Only) HBOT, full body chamber, 20min Modifier: Quantity: 4 CPT4 Code: Description: ICD-10 Diagnosis Description [phone] Other chronic osteomyelitis, left ankle and foot 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.621 Type 2 diabetes mellitus with foot ulcer Modifier: e Quantity: Physician Procedures CPT4 Code: 0370488 Description: 89169 - WC PHYS HYPERBARIC OXYGEN THERAPY Modifier: Quantity: 1 CPT4 Code: Description: ICD-10 Diagnosis Description M86.672 Other chronic osteomyelitis, left ankle and foot 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

## 2022-03-26 NOTE — Progress Notes (Addendum)
[place], [place] ([id number]) Visit Report for 03/26/2022 Arrival Information Details Patient Name: RAYMONDO, GARCIALOPEZ. Date of Service: 03/26/2022 11:00 AM Medical Record Number: [id number] Patient Account Number: [id number] Date of Birth/Sex: [birth year] (56 y.o. M) Treating RN: [name] Primary Care Kaytlin Burklow: [name] Other Clinician: Donavan Burnet Referring Wolfgang Finigan: [name] Treating Nicolas Banh/Extender: [name] in Treatment: 48 Visit Information History Since Last Visit All ordered tests and consults were completed: Yes Patient Arrived: Ambulatory Added or deleted any medications: No Arrival Time: 09:35 Any new allergies or adverse reactions: No Accompanied By: self Had a fall or experienced change in No Transfer Assistance: None activities of daily living that may affect Patient Identification Verified: Yes risk of falls: Secondary Verification Process Completed: Yes Signs or symptoms of abuse/neglect since last visito No Patient Requires Transmission-Based No Hospitalized since last visit: No Precautions: Implantable device outside of the clinic excluding No Patient Has Alerts: Yes cellular tissue based products placed in the center Patient Alerts: Patient on Blood since last visit: Thinner Pain Present Now: No Electronic Signature(s) Signed: 03/26/2022 11:38:40 AM By: Donavan Burnet CHT, EMT, BS Entered By: Donavan Burnet on 03/26/2022 11:38:40 Mccready, [name] ([id number]) -------------------------------------------------------------------------------- Encounter Discharge Information Details Patient Name: Breisch, Bernardino L. Date of Service: 03/26/2022 11:00 AM Medical Record Number: [id number] Patient Account Number: [id number] Date of Birth/Sex: [birth year] (56 y.o. M) Treating RN: [name] Primary Care Kaiyden Simkin: [name] Other Clinician: Donavan Burnet Referring Gabbi Whetstone: [name] Treating Tishia Maestre/Extender: [name]  in Treatment: 20 Encounter Discharge Information Items Discharge Condition: Stable Ambulatory Status: Ambulatory Discharge Destination: Home Transportation: Private Auto Accompanied By: self Schedule Follow-up Appointment: No Clinical Summary of Care: Electronic Signature(s) Signed: 03/26/2022 12:23:00 PM By: Donavan Burnet CHT, EMT, BS Entered By: Donavan Burnet on 03/26/2022 12:23:00 Pember, [place] ([id number]) -------------------------------------------------------------------------------- Vitals Details Patient Name: Schweer, Fredderick L. Date of Service: 03/26/2022 11:00 AM Medical Record Number: [id number] Patient Account Number: [id number] Date of Birth/Sex: [birth year] (56 y.o. M) Treating RN: [name] Primary Care Javayah Magaw: [name] Other Clinician: Donavan Burnet Referring Ahni Bradwell: [name] Treating Aidin Doane/Extender: [name] in Treatment: 20 Vital Signs Time Taken: 09:43 Temperature (F): 98.0 Height (in): 70 Pulse (bpm): 72 Weight (lbs): 180.4 Respiratory Rate (breaths/min): 18 Body Mass Index (BMI): 25.9 Blood Pressure (mmHg): 110/68 Capillary Blood Glucose (mg/dl): 227 Reference Range: 80 - 120 mg / dl Electronic Signature(s) Signed: 03/26/2022 11:39:08 AM By: Donavan Burnet CHT, EMT, BS Entered By: Donavan Burnet on 03/26/2022 11:39:08

## 2022-03-27 ENCOUNTER — Encounter: Payer: Medicare Other | Admitting: Physician Assistant

## 2022-03-27 DIAGNOSIS — N186 End stage renal disease: Secondary | ICD-10-CM | POA: Diagnosis not present

## 2022-03-27 DIAGNOSIS — L97522 Non-pressure chronic ulcer of other part of left foot with fat layer exposed: Secondary | ICD-10-CM | POA: Diagnosis not present

## 2022-03-27 DIAGNOSIS — E1122 Type 2 diabetes mellitus with diabetic chronic kidney disease: Secondary | ICD-10-CM | POA: Diagnosis not present

## 2022-03-27 DIAGNOSIS — L97524 Non-pressure chronic ulcer of other part of left foot with necrosis of bone: Secondary | ICD-10-CM | POA: Diagnosis not present

## 2022-03-27 DIAGNOSIS — M86672 Other chronic osteomyelitis, left ankle and foot: Secondary | ICD-10-CM | POA: Diagnosis not present

## 2022-03-27 DIAGNOSIS — E11621 Type 2 diabetes mellitus with foot ulcer: Secondary | ICD-10-CM | POA: Diagnosis not present

## 2022-03-27 DIAGNOSIS — T8189XA Other complications of procedures, not elsewhere classified, initial encounter: Secondary | ICD-10-CM | POA: Diagnosis not present

## 2022-03-27 DIAGNOSIS — I132 Hypertensive heart and chronic kidney disease with heart failure and with stage 5 chronic kidney disease, or end stage renal disease: Secondary | ICD-10-CM | POA: Diagnosis not present

## 2022-03-27 LAB — GLUCOSE, CAPILLARY
Glucose-Capillary: 141 mg/dL — ABNORMAL HIGH (ref 70–99)
Glucose-Capillary: 190 mg/dL — ABNORMAL HIGH (ref 70–99)

## 2022-03-27 NOTE — Progress Notes (Addendum)
[place], [place] ([id number]) Visit Report for 03/27/2022 Arrival Information Details Patient Name: [name], [name]. Date of Service: 03/27/2022 8:00 AM Medical Record Number: [id number] Patient Account Number: [id number] Date of Birth/Sex: 19-Jul-1966 (56 y.o. M) Treating RN: [name] Primary Care Fines Kimberlin: [name] Other Clinician: Donavan Burnet Referring Sumedh Shinsato: [name] Treating Karla Vines/Extender: [name] in Treatment: 21 Visit Information History Since Last Visit All ordered tests and consults were completed: Yes Patient Arrived: Ambulatory Added or deleted any medications: No Arrival Time: 08:01 Any new allergies or adverse reactions: No Accompanied By: self Had a fall or experienced change in No Transfer Assistance: None activities of daily living that may affect Patient Identification Verified: Yes risk of falls: Secondary Verification Process Completed: Yes Signs or symptoms of abuse/neglect since last visito No Patient Requires Transmission-Based No Hospitalized since last visit: No Precautions: Implantable device outside of the clinic excluding No Patient Has Alerts: Yes cellular tissue based products placed in the center Patient Alerts: Patient on Blood since last visit: Thinner Pain Present Now: No Electronic Signature(s) Signed: 03/27/2022 9:13:47 AM By: Donavan Burnet CHT, EMT, BS Entered By: Donavan Burnet on 03/27/2022 09:13:47 [name], [name] ([id number]) -------------------------------------------------------------------------------- Encounter Discharge Information Details Patient Name: [name], [name] Date of Service: 03/27/2022 8:00 AM Medical Record Number: [id number] Patient Account Number: [id number] Date of Birth/Sex: 1966-07-01 (56 y.o. M) Treating RN: [name] Primary Care Khamora Karan: [name] Other Clinician: Donavan Burnet Referring Amadu Schlageter: [name] Treating Sherrica Niehaus/Extender: [name] in  Treatment: 21 Encounter Discharge Information Items Discharge Condition: Stable Ambulatory Status: Ambulatory Discharge Destination: Home Transportation: Private Auto Accompanied By: self Schedule Follow-up Appointment: No Clinical Summary of Care: Electronic Signature(s) Signed: 03/27/2022 10:11:49 AM By: Donavan Burnet CHT, EMT, BS Entered By: Donavan Burnet on 03/27/2022 10:11:48 [name], [name] ([id number]) -------------------------------------------------------------------------------- Vitals Details Patient Name: [name], [name] Date of Service: 03/27/2022 8:00 AM Medical Record Number: [id number] Patient Account Number: [id number] Date of Birth/Sex: November 14, 1965 (56 y.o. M) Treating RN: [name] Primary Care Itai Barbian: [name] Other Clinician: Donavan Burnet Referring Lilla Callejo: [name] Treating Ulrich Soules/Extender: [name] in Treatment: 21 Vital Signs Time Taken: 08:03 Temperature (F): 98.0 Height (in): 70 Pulse (bpm): 84 Weight (lbs): 180.4 Respiratory Rate (breaths/min): 18 Body Mass Index (BMI): 25.9 Blood Pressure (mmHg): 108/72 Capillary Blood Glucose (mg/dl): 141 Reference Range: 80 - 120 mg / dl Electronic Signature(s) Signed: 03/27/2022 9:14:17 AM By: Donavan Burnet CHT, EMT, BS Entered By: Donavan Burnet on 03/27/2022 09:14:16

## 2022-03-27 NOTE — Progress Notes (Addendum)
Pettey, Isabela ([id number]) Visit Report for 03/27/2022 Chief Complaint Document Details Patient Name: [name], [name]. Date of Service: 03/27/2022 10:00 AM Medical Record Number: [id number] Patient Account Number: [id number] Date of Birth/Sex: 10-03-1966 (56 y.o. M) Treating RN: Primary Care Provider: [name] Other Clinician: Referring Provider: [name] Treating Provider/Extender: Weeks in Treatment: 21 Information Obtained from: Patient Chief Complaint HBO evaluation for treatment on the left foot diabetic ulcer with osteomyelitis. Patient is currently under our care as well for his foot. Electronic Signature(s) Signed: 03/27/2022 10:37:08 AM By: Worthy Keeler PA-C Entered By: Worthy Keeler on 03/27/2022 10:37:08 [name], [name] ([id number]) -------------------------------------------------------------------------------- Debridement Details Patient Name: [name], [name] Date of Service: 03/27/2022 10:00 AM Medical Record Number: [id number] Patient Account Number: [id number] Date of Birth/Sex: [birth year] (56 y.o. M) Treating RN: Levora Dredge Primary Care Provider: [name] Other Clinician: Massie Kluver Referring Provider: [name] Treating Provider/Extender: [name] in Treatment: 21 Debridement Performed for Wound #1 Left Amputation Site - Digit Assessment: Performed By: Physician Tommie Sams., PA-C Debridement Type: Debridement Level of Consciousness (Pre- Awake and Alert procedure): Pre-procedure Verification/Time Out Yes - 10:55 Taken: Start Time: 10:55 Total Area Debrided (L x W): 3.3 (cm) x 1.2 (cm) = 3.96 (cm) Tissue and other material Callus, Slough, Subcutaneous, Slough debrided: Level: Skin/Subcutaneous Tissue Debridement Description: Excisional Instrument: Curette Bleeding: Minimum Hemostasis Achieved: Pressure End Time: 11:02 Response to Treatment: Procedure was tolerated well Level of Consciousness (Post- Awake and  Alert procedure): Post Debridement Measurements of Total Wound Length: (cm) 2.6 Width: (cm) 0.7 Depth: (cm) 0.5 Volume: (cm) 0.715 Character of Wound/Ulcer Post Debridement: Stable Post Procedure Diagnosis Same as Pre-procedure Electronic Signature(s) Signed: 03/27/2022 1:44:13 PM By: Levora Dredge Signed: 03/27/2022 4:11:43 PM By: Worthy Keeler PA-C Signed: 03/28/2022 4:23:30 PM By: Massie Kluver Entered By: Massie Kluver on 03/27/2022 11:03:57 [name], [name] ([id number]) -------------------------------------------------------------------------------- HPI Details Patient Name: [name], [name] Date of Service: 03/27/2022 10:00 AM Medical Record Number: [id number] Patient Account Number: [id number] Date of Birth/Sex: June 05, 1966 (56 y.o. M) Treating RN: Primary Care Provider: [name] Other Clinician: Referring Provider: [name] Treating Provider/Extender: Suella [place] in Treatment: 21 History of Present Illness HPI Description: 10/31/2021 patient presents today for evaluation here in our clinic. He is actually being evaluated for hyperbaric oxygen therapy only. He has been seen at [facility] up to this point he is under the care of the vascular surgery specialty. Subsequently he is also under the care of the hyperbaric center there. With that being said that due to the fact that he is a dialysis patient he was actually recommended to be transferred to [place] for his treatments he actually lives in [place] which is where his dialysis is. It was a hardship for him to try to get from [place] especially on dialysis days all the way to [facility] get into the facility for his treatment and get back home he was literally spending all day long. He has had just 4 treatments there at [facility] thus far based on what I see in these have not been continued while he awaits approval here in our clinic. Subsequently I do have records for review that will be included in the HPI predetermination  review and attached to this chart as well. I will not duplicate that here. 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 Nonetheless he continues to have exposed bone with marked soft tissue swelling compatible with osteomyelitis. This is due to the irregularity of the head of the metatarsal as well. Nonetheless along with having associated cellulitis he is also good to be seen by infectious disease and is currently on antibiotics for this as well. Again that will be detailed in the pretreatment review section which I will attach to this note as well. He does currently have a wound VAC in place and again the wound was not evaluated here in the clinic as that is being completely managed by home health and [facility]. The patient does have a history of diabetes mellitus type 2, hypertension, coronary artery disease, and congestive heart failure. He also has cataracts of both eyes but no evidence of glaucoma on his most recent eye exam. 11/22/2021 we have been seeing this gentleman for hyperbaric oxygen therapy but have not actually evaluated his wound up to this point this was being managed by the wound care and vascular team I presumed at [facility] at least that is what has been told to be previous. Nonetheless at this point I got a call from Atlee Abide who is a Librarian, academic at the [facility] vascular clinic with Dr. Durene Fruits and subsequently they were wanting to know if I could take over wound care for this patient. Apparently they do not have the ability for an office debridement which again I completely understand but nonetheless definitely is not an integral part of his healing along with the hyperbarics. Subsequently I discussed with the patient today that I do believe he would benefit going ahead and proceeding with evaluation here in the clinic  for that reason I did go ahead and see him today to get things started. There is also been some confusion about the issues with his home health nurse and getting measurements to [facility] for the wound VAC in order to continue his wound VAC therapy. With that being said after I see him today we will make a determination on what to do with wound VAC we can definitely send measurements to [facility] but again the bigger question is whether this is the best way to go or not. 11/28/2021 upon evaluation today patient appears to be doing decently well in regard to his wound. He has been tolerating the dressing changes with the Dakin's moistened gauze dressing which I think is doing a good job. Fortunately I do not see any signs of active infection locally nor systemically at this point. In general I think that he is actually making excellent progress which is great news. 2/17; seen in conjunction with HBO today. 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 Much improved condition of the wound surface. On the lateral foot and eschared area that he has been using Betadine. Tolerating HBO well 12/13/2021 upon evaluation today patient appears to be doing pretty well in regard to his foot ulcer. I am actually much more pleased currently that I been with the way the appearance looks today. The wound bed does show some signs of slough and biofilm on the surface of the wound this is minimal I Minna perform debridement the I will do so very carefully due to the fact that to be honest the patient had a significant amount of bleeding in the past being on the [place]il[place] he had a difficult time clotting when I debrided him previously quite significantly. Fortunately I do not see any  signs of active infection at this time which is great news. No fevers, chills, nausea, vomiting, or diarrhea. 12/20/2021 upon evaluation today patient actually appears to be showing some signs of excellent improvement here. I am  very pleased with where things stand currently. There does not appear to be any evidence of active infection locally nor systemically which is great news and I do believe that with the hyperbaric oxygen therapy he is actually making excellent progress. 3/9; patient presents for follow-up. He continues to do HBO therapy without issues. He has completed 34 out of 40 sessions. He continues to use Dakin's wet-to-dry to the amputation site and Betadine to the lateral foot wound. he currently denies signs of infection. 01/03/2022 upon evaluation today patient appears to be doing better in regard to his distal foot ulcer. Unfortunately the lateral foot is a different story this area has softened up and is getting need to be debrided away today. I Georgina Peer do this with scissors and forceps and was able to clear that out that will be detailed below. Otherwise I feel like the Dakin's moistened gauze dressing is doing an awesome job for him and he is making significantly improvements here. 01/10/2022 upon evaluation today patient continues to have some of the necrotic tissue loosen up on the lateral portion of his foot the main wound that we have been treating is actually doing quite well. Overall very pleased with where things stand in general. No fevers, chills, nausea, vomiting, or diarrhea. 01/17/2022 upon evaluation today patient appears to be doing somewhat poorly in regard to the lateral foot wound. The main wound that we initially were seeing him for looks to be doing great. This lateral foot has worsened and unfortunately it appears to potentially have some signs of infection here. I do believe that we may need to see about getting started on some IV antibiotics he is in dialysis so they could potentially do this for him, Monday. Nonetheless I want to try to get that started ASAP. Again I would prefer him be initiated with IV vancomycin and cefepime just as an empiric broad-spectrum until we can get the  culture results back at least. Subsequently also think that this should be done sooner rather than later due to the fact that his leg is more swollen and overall this seems to have progressed fairly rapidly since last week I do not want this to get worse. He voiced understanding. 01-24-2022 upon evaluation today patient appears to be doing well with regard to his main ulcer that we have been seeing him for. I am very pleased with where things stand currently. He still continues to have bleeding if we do any type of aggressive sharp debridement there for him [name], [name] ([id number]) having to be very careful using Dakin's solution along with debridement as I can here in the office. Overall the patient seems to be doing well. Unfortunately the lateral foot wound still has progressed a little bit further. We did get him on antibiotics starting this past Monday I think the drainage looks much better and overall I am seeing improvement but again this was already broken down to a certain degree I am hopeful by next week this will really start turnaround for [place]. We will also get a send him for an x-ray to evaluate for any signs of osteomyelitis on this lateral foot. 01-31-2022 upon evaluation today patient's wound on the medial left amputation site is actually doing excellent. I am very pleased with where things  stand this is significantly smaller and we are headed in the right direction. In regard to the left lateral foot there is still some necrotic tissue to be cleared away although I see evidence of this starting to basically solidify as far as the edges are concerned with how far it spread I do not see anything seeming to want to spread further. I believe this may be close to done as far as any evolving is concerned. 02-07-2022 upon evaluation today patient appears to be doing well with regard to the main amputation site. Unfortunately the secondary site on the left lateral foot is still giving him some  trouble here. There is a lot of dried tissue and I do believe that along with the Dakin's we may want to use a contact layer in the base of the wound to try to prevent the bone from drying out because we are down to that point at this time. The patient voiced understanding at this point as well and is happy to do what ever it takes to try to get this better. With that being said I do believe that it is possible he may benefit from a wound VAC. Again the question is the approval process for getting this done obviously we can see what we can do if we get things cleaned a little bit better but right now I think the best option is going to be for [place] to continue with the packing currently. 02-14-2022 upon evaluation today patient presents for follow-up concerning ongoing issues with his foot. He did have the MRI which does show that he has evidence of osteomyelitis of the digit at the fifth location of his foot. Subsequently this does extend down to the base of the digit and into the proximal portion of the metatarsal as well. With that being said the bone does not appear to be too severely broken down and a lot of the marrow signal is stated to be maintained which is good news. Nonetheless I do believe that he is unfortunately still in a spot where this is precarious and we need to make sure that we are treating this appropriately. He does seem to be doing well with IV antibiotics he also seems to be doing quite well when it comes to the overall appearance of the wound bed at this point. I am happy in that regard. 02-21-2022 upon evaluation today patient appears to be doing well all things considered in regard to his wounds. I do feel like that he is making some progress here. The wound on the right distal foot where the amputation site is is actually healing nicely I feel like we can switch over to collagen at this point that should do well for him. In regard to the left lateral foot this is actually getting  much cleaner he still has some area where there seems to be some pressure getting to the location I think that he would do well with a knee scooter at this point. I discussed that with him he does not seem prone to going that route at this point he understands it can definitely help. 02-28-2022 upon evaluation today patient appears to be doing well currently with regard to the wound on his foot. The main surgical wound is actually significantly improved and the lateral wound is doing better there is still bone exposed but nonetheless this does not appear to be significantly worse than what we have noted previous. In fact I think it looks better than  it did last week. My goal is to keep the bone from drying out and subsequently for that reason we are going to go ahead and proceed with the EpiFix which I think is good to be a good option here for him. Over top of the EpiFix we will do a contact layer in order to hopefully keep things from drying out. This dressing will actually stay in place until he comes back. Patient did see vascular yesterday and states that they did not feel like anything was doing worse still it appears that if it were left up to them they think the ideal thing would be to proceed with an amputation. I explained to the patient that to be honest is not always avoidable but if we can try to avoid amputation that is what we are after. He voiced understanding and he does want to do what ever he can to try to prevent amputation for that reason we are proceeding with the epi cord today. This will be application #1. [id number] upon evaluation today patient appears to be making some progress here regard to the lateral foot. Fortunately there does not appear to be any signs of active infection locally or systemically which is great news. No fevers, chills, nausea, vomiting, or diarrhea. I do think that he is appropriate for second application of EpiCord today. 03-14-2022 upon evaluation today  patient's wound still has significant exposure of the bone at this point. He did get crutches he has been trying to use those the past couple of days am not sure that is going so well to be perfectly honest. We did discuss however offloading is important and we even discussed a total contact cast but to be perfectly honest right now and feels like he may need more debridement to clear away some of the bone which is not viable in the end of the foot of the fifth metatarsal but I am unable to really do that here in the office due to his bleeding. I do not know if potentially we could have him seen by podiatry potentially Dr. Merril Abbe to see if there is anything he can do to help in this regard. I am not even certain whether the majority of the metatarsal is even viable to be honest. Either way I do think that he would benefit from surgical debridement or even possibly partial ray amputation in order for [place] to try to keep things moving in the right direction and get this closed. This was all discussed with the patient today. With that being said I know he wants to avoid a below-knee amputation which is what has previously been recommended. 03-21-2022 upon evaluation today patient appears to be doing well in regard to the medial wound at this is shown signs of excellent improvement. In regard to the lateral wound this is a bit dry compared to what I want to see him [name] not draining as much but I think that we need to see what we can do about trying to keep this from drying out and keeping it a bit more moist. Patient voiced understanding. Overall I am very pleased otherwise with where we stand. 03-27-2022 upon evaluation today patient appears to be doing well currently in regard to his wound especially on the medial aspect this looks really good the lateral aspect is looking a little bit better to with some healthier appearance to the bone compared to last week to be honest. Fortunately I do not see any  signs  of active infection locally or systemically at this time which is great news. No fevers, chills, nausea, vomiting, or diarrhea. Electronic Signature(s) Signed: 03/27/2022 11:56:21 AM By: Worthy Keeler PA-C Entered By: Worthy Keeler on 03/27/2022 11:56:21 [name], [name] ([id number]) -------------------------------------------------------------------------------- Physical Exam Details Patient Name: Faulcon, Arleigh L. Date of Service: 03/27/2022 10:00 AM Medical Record Number: [id number] Patient Account Number: [id number] Date of Birth/Sex: 08/02/1966 (56 y.o. M) Treating RN: Levora Dredge Primary Care Provider: [name] Other Clinician: Massie Kluver Referring Provider: [name] Treating Provider/Extender: [name] in Treatment: 21 Constitutional Well-nourished and well-hydrated in no acute distress. Respiratory normal breathing without difficulty. Psychiatric this patient is able to make decisions and demonstrates good insight into disease process. Alert and Oriented x 3. pleasant and cooperative. Notes Patient's wound bed did require some sharp debridement to remove some of the callus as well as slough and biofilm from the medial aspect wound he tolerated this today without complication postdebridement the wound bed appears to be doing much better. In regard to the lateral aspect I did not perform any debridement here due to the significant issues with bleeding that we often have although there was some improvement in regard to the tissue over the bone towards the distal but more medial aspect. There still appears to be a lot of necrotic bone distally that I just cannot really manage in outpatient setting unfortunately. That is why he will be seeing Dr. Amalia Hailey in the coming weeks. Electronic Signature(s) Signed: 03/27/2022 11:57:02 AM By: Worthy Keeler PA-C Entered By: Worthy Keeler on 03/27/2022 11:57:01 [name], [name]  ([id number]) -------------------------------------------------------------------------------- Physician Orders Details Patient Name: [name], [name] Date of Service: 03/27/2022 10:00 AM Medical Record Number: [id number] Patient Account Number: [id number] Date of Birth/Sex: 19-Aug-1966 (56 y.o. M) Treating RN: Levora Dredge Primary Care Provider: [name] Other Clinician: Massie Kluver Referring Provider: [name] Treating Provider/Extender: [name] in Treatment: 21 Verbal / Phone Orders: No Diagnosis Coding ICD-10 Coding Code Description E11.621 Type 2 diabetes mellitus with foot ulcer 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e M86.672 Other chronic osteomyelitis, left ankle and foot I10 Essential (primary) hypertension I25.10 Atherosclerotic heart disease of native coronary artery without angina pectoris I50.42 Chronic combined systolic (congestive) and diastolic (congestive) heart failure R26.89 Other abnormalities of gait and mobility Follow-up Appointments o Return Appointment in 1 week. - provider visits at wound center o Return Appointment in: - HBO- (daily Monday -Friday for HBO Treatments) [place] for wound care. May utilize formulary equivalent dressing for wound treatment orders unless otherwise specified. Home Health Nurse may visit PRN to address patientos wound care needs. o Scheduled days for dressing changes to be completed; exception, patient has scheduled wound care visit that day. o **Please direct any NON-WOUND related issues/requests for orders to patient's Primary Care Physician. **If current dressing causes regression in wound condition, may D/C ordered dressing product/s and apply Normal Saline Moist Dressing daily until next [name] or Other MD appointment. **Notify Wound Healing Center of regression in wound  condition at [phone]. Bathing/ Shower/ Hygiene o Clean wound with Normal Saline or wound cleanser. o May shower with wound dressing protected with water repellent cover or cast protector. o No tub bath. Cellular or Tissue Based Products Wound #2 Left,Lateral Foot o Cellular or Tissue Based Product applied to wound bed; including contact layer, fixation with steri-strips,  dry gauze and cover dressing. (DO NOT REMOVE). Edema Control - Lymphedema / Segmental Compressive Device / Other o Elevate, Exercise Daily and Avoid Standing for Long Periods of Time. o Elevate legs to the level of the heart and pump ankles as often as possible o Elevate leg(s) parallel to the floor when sitting. Off-Loading Left Lower Extremity o Foot Defender o - size large Additional Orders / Instructions o Follow Nutritious Diet and Increase Protein Intake o Other: - Follow vascular, nephrology and PCP for wound care and other needs Dialysis appt as scheduled M-W-F Dr Amalia Hailey 03/31/22 Hyperbaric Oxygen Therapy o Indication and location: - Left 1st and 2nd Ray Amputation osteomyelitis and left lateral foot osteomyelitis. I added treatments onto the original regimen to account for the lateral foot osteomyelitis. o If appropriate for treatment, begin HBOT per protocol: o 2.0 ATA for 90 Minutes without Air Breaks o One treatment per day (delivered Monday through Friday unless otherwise specified in Special Instructions below): [place], [place] ([id number]) o Total # of Treatments: - 100 o Finger stick Blood Glucose Pre- and Post- HBOT Treatment. o Follow Hyperbaric Oxygen Glycemia Protocol Wound Treatment Wound #1 - Amputation Site - Digit Wound Laterality: Left Cleanser: Wound Cleanser 3 x Per Week/30 Days Discharge Instructions: Wash your hands with soap and water. Remove old dressing, discard into plastic bag and place into trash. Cleanse the wound with Wound Cleanser prior to  applying a clean dressing using gauze sponges, not tissues or cotton balls. Do not scrub or use excessive force. Pat dry using gauze sponges, not tissue or cotton balls. Primary Dressing: Prisma 4.34 (in) 3 x Per Week/30 Days Discharge Instructions: Moisten w/normal saline or sterile water; Cover wound as directed. Do not remove from wound bed. Secured With: Medipore Tape - 49M Medipore H Soft Cloth Surgical Tape, 2x2 (in/yd) 3 x Per Week/30 Days Secured With: [employer] Sterile or Non-Sterile 6-ply 4.5x4 (yd/yd) 3 x Per Week/30 Days Discharge Instructions: Apply Kerlix as directed Secured With: ABD pad 3 x Per Week/30 Days Wound #2 - Foot Wound Laterality: Left, Lateral Cleanser: Wound Cleanser 3 x Per Week/30 Days Discharge Instructions: Wash your hands with soap and water. Remove old dressing, discard into plastic bag and place into trash. Cleanse the wound with Wound Cleanser prior to applying a clean dressing using gauze sponges, not tissues or cotton balls. Do not scrub or use excessive force. Pat dry using gauze sponges, not tissue or cotton balls. Primary Dressing: Xeroform 4x4-HBD (in/in) 3 x Per Week/30 Days Discharge Instructions: Apply Xeroform 4x4-HBD (in/in) as directed Primary Dressing: AquaSite Hydrogel Impregnated Gauze, 4x4 (in/in) 3 x Per Week/30 Days Secondary Dressing: ABD Pad 5x9 (in/in) 3 x Per Week/30 Days Discharge Instructions: Cover with ABD pad Secondary Dressing: Kerlix 4.5 x 4.1 (in/yd) 3 x Per Week/30 Days Discharge Instructions: Apply Kerlix 4.5 x 4.1 (in/yd) as instructed Secured With: Medipore Tape - 49M Medipore H Soft Cloth Surgical Tape, 2x2 (in/yd) 3 x Per Week/30 Days GLYCEMIA INTERVENTIONS PROTOCOL PRE-HBO GLYCEMIA INTERVENTIONS ACTION INTERVENTION Obtain pre-HBO capillary blood glucose (ensure 1 physician order is in chart). A. Notify HBO physician and await physician orders. 2 If result is 70 mg/dl or below: B. If the result meets the hospital  definition of a critical result, follow hospital policy. A. Give patient an 8 ounce Glucerna Shake, an 8 ounce Ensure, or 8 ounces of a Glucerna/Ensure equivalent dietary supplement*. B. Wait 30 minutes. If result is 71 mg/dl to 130 mg/dl: C. Retest patientos  capillary blood glucose (CBG). D. If result greater than or equal to 110 mg/dl, proceed with HBO. If result less than 110 mg/dl, notify HBO physician and consider holding HBO. If result is 131 mg/dl to 249 mg/dl: A. Proceed with HBO. A. Notify HBO physician and await physician orders. B. It is recommended to hold HBO and do If result is 250 mg/dl or greater: blood/urine ketone testing. C. If the result meets the hospital definition of a critical result, follow hospital policy. POST-HBO GLYCEMIA INTERVENTIONS ACTION INTERVENTION Mun, Cyruss L. ([id number]) 1 Obtain post HBO capillary blood glucose (ensure physician order is in chart). A. Notify HBO physician and await physician orders. 2 If result is 70 mg/dl or below: B. If the result meets the hospital definition of a critical result, follow hospital policy. A. Give patient an 8 ounce Glucerna Shake, an 8 ounce Ensure, or 8 ounces of a Glucerna/Ensure equivalent dietary supplement*. B. Wait 15 minutes for symptoms of hypoglycemia (i.e. nervousness, anxiety, If result is 71 mg/dl to 100 mg/dl: sweating, chills, clamminess, irritability, confusion, tachycardia or dizziness). C. If patient asymptomatic, discharge patient. If patient symptomatic, repeat capillary blood glucose (CBG) and notify HBO physician. If result is 101 mg/dl to 249 mg/dl: A. Discharge patient. A. Notify HBO physician and await physician orders. B. It is recommended to do blood/urine If result is 250 mg/dl or greater: ketone testing. C. If the result meets the hospital definition of a critical result, follow hospital policy. *Juice or candies are NOT equivalent products. If patient  refuses the Glucerna or Ensure, please consult the hospital dietitian for an appropriate substitute. Electronic Signature(s) Signed: 03/27/2022 4:11:43 PM By: Worthy Keeler PA-C Signed: 03/28/2022 4:23:30 PM By: Massie Kluver Entered By: Massie Kluver on 03/27/2022 11:56:32 [name], [name] ([id number]) -------------------------------------------------------------------------------- Problem List Details Patient Name: Hickson, Phillips L. Date of Service: 03/27/2022 10:00 AM Medical Record Number: [id number] Patient Account Number: [id number] Date of Birth/Sex: [birth year] (56 y.o. 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/31/2021 No Yes bone M86.672 Other chronic osteomyelitis, left ankle and foot 10/31/2021 No Yes I10 Essential (primary) hypertension 10/31/2021 No Yes I25.10 Atherosclerotic heart disease of native coronary artery without angina 10/31/2021 No Yes pectoris I50.42 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 Date of Service: 03/27/2022 10:00 AM Medical Record Number: [id number] Patient Account Number: [id number] Date of Birth/Sex: 01/28/1966 (56 y.o. M) Treating RN: Levora Dredge Primary Care Provider: [name] Other Clinician: Massie Kluver Referring Provider: [name] Treating Provider/Extender: [name] in Treatment: 21 Subjective Chief Complaint Information obtained from Patient HBO evaluation for treatment on the left foot diabetic ulcer with osteomyelitis. Patient is currently under our care as well for his foot. History of Present Illness (HPI) 10/31/2021 patient presents today for evaluation here in our clinic. He is actually being evaluated for hyperbaric oxygen therapy only. He has been seen at [facility] up to this point he is  under the care of the vascular surgery specialty. Subsequently he is also under the care of the hyperbaric center there. With that being said that due to the fact that he is a dialysis patient he was actually recommended to be transferred to [place] for his treatments he actually lives in [place] which is where his dialysis is. It was a hardship for him to try to get from [place] especially on dialysis days all the way to [facility] get into the facility for his treatment and get back home he was literally spending all day long. He has had just 4 treatments there at [facility] thus far based on what I see in these have not been continued while he awaits approval here in our clinic. Subsequently I do have records for review that will be included in the HPI predetermination review and attached to this chart as well. I will not duplicate that here. 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 Nonetheless he continues to have exposed bone with marked soft tissue swelling compatible with osteomyelitis. This is due to the irregularity of the head of the metatarsal as well. Nonetheless along with having associated cellulitis he is also good to be seen by infectious disease and is currently on antibiotics for  this as well. Again that will be detailed in the pretreatment review section which I will attach to this note as well. He does currently have a wound VAC in place and again the wound was not evaluated here in the clinic as that is being completely managed by home health and [facility]. The patient does have a history of diabetes mellitus type 2, hypertension, coronary artery disease, and congestive heart failure. He also has cataracts of both eyes but no evidence of glaucoma on his most recent eye exam. 11/22/2021 we have been seeing this gentleman for hyperbaric oxygen therapy but have not actually evaluated his wound up to this point this was being managed by the wound care and vascular team I presumed at [facility] at least that is what has been told to be previous. Nonetheless at this point I got a call from Atlee Abide who is a Librarian, academic at the [facility] vascular clinic with Dr. Durene Fruits and subsequently they were wanting to know if I could take over wound care for this patient. Apparently they do not have the ability for an office debridement which again I completely understand but nonetheless definitely is not an integral part of his healing along with the hyperbarics. Subsequently I discussed with the patient today that I do believe he would benefit going ahead and proceeding with evaluation here in the clinic for that reason I did go ahead and see him today to get things started. There is also been some confusion about the issues with his home health nurse and getting measurements to [facility] for the wound VAC in order to continue his wound VAC therapy. With that being said after I see him today we will make a determination on what to do with wound VAC we can definitely send measurements to [facility] but again the bigger question is whether this is the best way to go or not. 11/28/2021 upon evaluation today patient appears to be doing decently well in regard to his wound. He has been tolerating the  dressing changes with the Dakin's moistened gauze dressing which I  think is doing a good job. Fortunately I do not see any signs of active infection locally nor systemically at this point. In general I think that he is actually making excellent progress which is great news. 2/17; seen in conjunction with HBO today. 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 Much improved condition of the wound surface. On the lateral foot and eschared area that he has been using Betadine. Tolerating HBO well 12/13/2021 upon evaluation today patient appears to be doing pretty well in regard to his foot ulcer. I am actually much more pleased currently that I been with the way the appearance looks today. The wound bed does show some signs of slough and biofilm on the surface of the wound this is minimal I Minna perform debridement the I will do so very carefully due to the fact that to be honest the patient had a significant amount of bleeding in the past being on the Brilinta he had a difficult time clotting when I debrided him previously quite significantly. Fortunately I do not see any signs of active infection at this time which is great news. No fevers, chills, nausea, vomiting, or diarrhea. 12/20/2021 upon evaluation today patient actually appears to be showing some signs of excellent improvement here. I am very pleased with where things stand currently. There does not appear to be any evidence of active infection locally nor systemically which is great news and I do believe that with the hyperbaric oxygen therapy he is actually making excellent progress. 3/9; patient presents for follow-up. He continues to do HBO therapy without issues. He has completed 34 out of 40 sessions. He continues to use Dakin's wet-to-dry to the amputation site and Betadine to the lateral foot wound. he currently denies signs of infection. 01/03/2022 upon evaluation today patient appears to be doing better in  regard to his distal foot ulcer. Unfortunately the lateral foot is a different story this area has softened up and is getting need to be debrided away today. I Georgina Peer do this with scissors and forceps and was able to clear that out that will be detailed below. Otherwise I feel like the Dakin's moistened gauze dressing is doing an awesome job for him and he is making significantly improvements here. 01/10/2022 upon evaluation today patient continues to have some of the necrotic tissue loosen up on the lateral portion of his foot the main wound that we have been treating is actually doing quite well. Overall very pleased with where things stand in general. No fevers, chills, nausea, vomiting, or diarrhea. 01/17/2022 upon evaluation today patient appears to be doing somewhat poorly in regard to the lateral foot wound. The main wound that we initially were seeing him for looks to be doing great. This lateral foot has worsened and unfortunately it appears to potentially have some signs of infection here. I do believe that we may need to see about getting started on some IV antibiotics he is in dialysis so they could potentially do this for him, Monday. Nonetheless I want to try to get that started ASAP. Again I would prefer him be initiated with IV vancomycin and cefepime just as an empiric broad-spectrum until we can get the culture results back at least. Subsequently also think that this should be done sooner rather [place], [place] ([id number]) than later due to the fact that his leg is more swollen and overall this seems to have progressed fairly rapidly since last week I do  not want this to get worse. He voiced understanding. 01-24-2022 upon evaluation today patient appears to be doing well with regard to his main ulcer that we have been seeing him for. I am very pleased with where things stand currently. He still continues to have bleeding if we do any type of aggressive sharp debridement there for  him having to be very careful using Dakin's solution along with debridement as I can here in the office. Overall the patient seems to be doing well. Unfortunately the lateral foot wound still has progressed a little bit further. We did get him on antibiotics starting this past Monday I think the drainage looks much better and overall I am seeing improvement but again this was already broken down to a certain degree I am hopeful by next week this will really start turnaround for [place]. We will also get a send him for an x-ray to evaluate for any signs of osteomyelitis on this lateral foot. 01-31-2022 upon evaluation today patient's wound on the medial left amputation site is actually doing excellent. I am very pleased with where things stand this is significantly smaller and we are headed in the right direction. In regard to the left lateral foot there is still some necrotic tissue to be cleared away although I see evidence of this starting to basically solidify as far as the edges are concerned with how far it spread I do not see anything seeming to want to spread further. I believe this may be close to done as far as any evolving is concerned. 02-07-2022 upon evaluation today patient appears to be doing well with regard to the main amputation site. Unfortunately the secondary site on the left lateral foot is still giving him some trouble here. There is a lot of dried tissue and I do believe that along with the Dakin's we may want to use a contact layer in the base of the wound to try to prevent the bone from drying out because we are down to that point at this time. The patient voiced understanding at this point as well and is happy to do what ever it takes to try to get this better. With that being said I do believe that it is possible he may benefit from a wound VAC. Again the question is the approval process for getting this done obviously we can see what we can do if we get things cleaned a little bit  better but right now I think the best option is going to be for [place] to continue with the packing currently. 02-14-2022 upon evaluation today patient presents for follow-up concerning ongoing issues with his foot. He did have the MRI which does show that he has evidence of osteomyelitis of the digit at the fifth location of his foot. Subsequently this does extend down to the base of the digit and into the proximal portion of the metatarsal as well. With that being said the bone does not appear to be too severely broken down and a lot of the marrow signal is stated to be maintained which is good news. Nonetheless I do believe that he is unfortunately still in a spot where this is precarious and we need to make sure that we are treating this appropriately. He does seem to be doing well with IV antibiotics he also seems to be doing quite well when it comes to the overall appearance of the wound bed at this point. I am happy in that regard. 02-21-2022 upon evaluation today  patient appears to be doing well all things considered in regard to his wounds. I do feel like that he is making some progress here. The wound on the right distal foot where the amputation site is is actually healing nicely I feel like we can switch over to collagen at this point that should do well for him. In regard to the left lateral foot this is actually getting much cleaner he still has some area where there seems to be some pressure getting to the location I think that he would do well with a knee scooter at this point. I discussed that with him he does not seem prone to going that route at this point he understands it can definitely help. 02-28-2022 upon evaluation today patient appears to be doing well currently with regard to the wound on his foot. The main surgical wound is actually significantly improved and the lateral wound is doing better there is still bone exposed but nonetheless this does not appear to be significantly worse  than what we have noted previous. In fact I think it looks better than it did last week. My goal is to keep the bone from drying out and subsequently for that reason we are going to go ahead and proceed with the EpiFix which I think is good to be a good option here for him. Over top of the EpiFix we will do a contact layer in order to hopefully keep things from drying out. This dressing will actually stay in place until he comes back. Patient did see vascular yesterday and states that they did not feel like anything was doing worse still it appears that if it were left up to them they think the ideal thing would be to proceed with an amputation. I explained to the patient that to be honest is not always avoidable but if we can try to avoid amputation that is what we are after. He voiced understanding and he does want to do what ever he can to try to prevent amputation for that reason we are proceeding with the epi cord today. This will be application #1. [id number] upon evaluation today patient appears to be making some progress here regard to the lateral foot. Fortunately there does not appear to be any signs of active infection locally or systemically which is great news. No fevers, chills, nausea, vomiting, or diarrhea. I do think that he is appropriate for second application of EpiCord today. 03-14-2022 upon evaluation today patient's wound still has significant exposure of the bone at this point. He did get crutches he has been trying to use those the past couple of days am not sure that is going so well to be perfectly honest. We did discuss however offloading is important and we even discussed a total contact cast but to be perfectly honest right now and feels like he may need more debridement to clear away some of the bone which is not viable in the end of the foot of the fifth metatarsal but I am unable to really do that here in the office due to his bleeding. I do not know if potentially we  could have him seen by podiatry potentially Dr. Merril Abbe to see if there is anything he can do to help in this regard. I am not even certain whether the majority of the metatarsal is even viable to be honest. Either way I do think that he would benefit from surgical debridement or even possibly partial ray amputation in order  for [place] to try to keep things moving in the right direction and get this closed. This was all discussed with the patient today. With that being said I know he wants to avoid a below-knee amputation which is what has previously been recommended. 03-21-2022 upon evaluation today patient appears to be doing well in regard to the medial wound at this is shown signs of excellent improvement. In regard to the lateral wound this is a bit dry compared to what I want to see him [name] not draining as much but I think that we need to see what we can do about trying to keep this from drying out and keeping it a bit more moist. Patient voiced understanding. Overall I am very pleased otherwise with where we stand. 03-27-2022 upon evaluation today patient appears to be doing well currently in regard to his wound especially on the medial aspect this looks really good the lateral aspect is looking a little bit better to with some healthier appearance to the bone compared to last week to be honest. Fortunately I do not see any signs of active infection locally or systemically at this time which is great news. No fevers, chills, nausea, vomiting, or diarrhea. Sabol, [name] Supply ([id number]) Objective Constitutional Well-nourished and well-hydrated in no acute distress. Vitals Time Taken: 10:25 AM, Height: 70 in, Weight: 180.4 lbs, BMI: 25.9, Temperature: 98.0 F, Pulse: 84 bpm, Respiratory Rate: 18 breaths/min, Blood Pressure: 108/72 mmHg. Respiratory normal breathing without difficulty. Psychiatric this patient is able to make decisions and demonstrates good insight into disease process. Alert  and Oriented x 3. pleasant and cooperative. General Notes: Patient's wound bed did require some sharp debridement to remove some of the callus as well as slough and biofilm from the medial aspect wound he tolerated this today without complication postdebridement the wound bed appears to be doing much better. In regard to the lateral aspect I did not perform any debridement here due to the significant issues with bleeding that we often have although there was some improvement in regard to the tissue over the bone towards the distal but more medial aspect. There still appears to be a lot of necrotic bone distally that I just cannot really manage in outpatient setting unfortunately. That is why he will be seeing Dr. Amalia Hailey in the coming weeks. Integumentary (Hair, Skin) Wound #1 status is Open. Original cause of wound was Surgical Injury. The date acquired was: 09/09/2021. The wound has been in treatment 17 weeks. 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 The wound measures 2.6cm length x 0.7cm width x 0.5cm depth; 1.429cm^2 area and 0.715cm^3 volume. There is Fat Layer (Subcutaneous Tissue) exposed. There is a medium amount of serosanguineous drainage noted. There is large (67-100%) red, pink granulation within the wound bed. There is a small (1-33%) amount of necrotic tissue within the wound bed including Adherent Slough. Wound #2 status is Open. Original cause of wound was Blister. The date acquired was: 10/28/2021. The wound has been in treatment 17 weeks. The wound is located on the Left,Lateral Foot. The wound measures 3.5cm length x 1.5cm width x 0.5cm depth; 4.123cm^2 area and 2.062cm^3 volume. There is bone, joint, muscle, tendon, and Fat Layer (Subcutaneous Tissue) exposed. There is a medium amount of serosanguineous drainage noted. There is small (1-33%) red granulation within the wound bed. There is a large (67-100%) amount of necrotic tissue within the wound bed including  Eschar, Adherent Slough and Necrosis of Muscle. 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 There was a Excisional Skin/Subcutaneous Tissue Debridement with a total area of 3.96 sq cm performed by Tommie Sams., PA-C. With the following instrument(s): Curette Material removed includes Callus, Subcutaneous Tissue, and Slough. A time out was conducted at 10:55, prior to the start of the procedure. A Minimum amount of bleeding was controlled with Pressure. The procedure was tolerated well. Post Debridement Measurements: 2.6cm length x 0.7cm width x 0.5cm depth; 0.715cm^3 volume. Character of Wound/Ulcer Post Debridement is stable. Post procedure Diagnosis Wound #1: Same as Pre-Procedure Plan [place], [place] ([id number]) Follow-up Appointments: Return Appointment in 1 week. - provider visits at wound center Return Appointment in: - HBO- (daily Monday -Friday for HBO Treatments) Home Health: [facility] for wound care. May utilize formulary equivalent dressing for wound treatment orders unless otherwise specified. Home Health Nurse may visit [facility] to address patient s wound care needs. Scheduled days for dressing changes to be completed; exception, patient has scheduled wound care visit that day. **Please direct any NON-WOUND related issues/requests for orders to patient's Primary Care Physician. **If current dressing causes regression  in wound condition, may D/C ordered dressing product/s and apply Normal Saline Moist Dressing daily until next [place] or Other MD appointment. **Notify [facility] of regression in wound condition at [phone]. Bathing/ Shower/ Hygiene: Clean wound with Normal Saline or wound cleanser. May shower with wound dressing protected with water repellent cover or cast protector. No tub bath. Cellular or Tissue Based Products: Wound #2 Left,Lateral Foot: Cellular or Tissue Based Product applied to wound bed; including contact layer, fixation with steri-strips, dry gauze and cover dressing. (DO NOT REMOVE). Edema Control - Lymphedema / Segmental Compressive Device / Other: Elevate, Exercise Daily and Avoid Standing for Long Periods of Time. Elevate legs to the level of the heart and pump ankles as often as possible Elevate leg(s) parallel to the floor when sitting. 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 I added treatments onto the original regimen to account for the lateral foot osteomyelitis. If appropriate for treatment, begin HBOT per protocol: 2.0 ATA for 90 Minutes without Air Breaks One treatment per day (delivered Monday through Friday unless otherwise specified in Special Instructions below): Total # of Treatments: - 100 Finger stick Blood Glucose Pre- and Post- HBOT Treatment. Follow Hyperbaric Oxygen Glycemia Protocol WOUND #1: - Amputation Site - Digit Wound Laterality: Left Cleanser: Wound Cleanser 3 x Per Week/30 Days Discharge Instructions: Wash your hands with soap and water. Remove old dressing, discard into  plastic bag and place into trash. Cleanse the wound with Wound Cleanser prior to applying a clean dressing using gauze sponges, not tissues or cotton balls. Do not scrub or use excessive force. [place]  dry using gauze sponges, not tissue or cotton balls. Primary Dressing: Prisma 4.34 (in) 3 x Per Week/30 Days Discharge Instructions: Moisten w/normal saline or sterile water; Cover wound as directed. Do not remove from wound bed. Secured With: Medipore Tape - 56M Medipore H Soft Cloth Surgical Tape, 2x2 (in/yd) 3 x Per Week/30 Days Secured With: [employer] Sterile or Non-Sterile 6-ply 4.5x4 (yd/yd) 3 x Per Week/30 Days Discharge Instructions: Apply Kerlix as directed Secured With: ABD pad 3 x Per Week/30 Days WOUND #2: - Foot Wound Laterality: Left, Lateral Cleanser: Wound Cleanser 3 x Per Week/30 Days Discharge Instructions: Wash your hands with soap and water. Remove old dressing, discard into plastic bag and place into trash. Cleanse the wound with Wound Cleanser prior to applying a clean dressing using gauze sponges, not tissues or cotton balls. Do not scrub or use excessive force. Pat dry using gauze sponges, not tissue or cotton balls. Primary Dressing: Xeroform 4x4-HBD (in/in) 3 x Per Week/30 Days Discharge Instructions: Apply Xeroform 4x4-HBD (in/in) as directed Primary Dressing: AquaSite Hydrogel Impregnated Gauze, 4x4 (in/in) 3 x Per Week/30 Days Secondary Dressing: ABD Pad 5x9 (in/in) 3 x Per Week/30 Days Discharge Instructions: Cover with ABD pad Secondary Dressing: Kerlix 4.5 x 4.1 (in/yd) 3 x Per Week/30 Days Discharge Instructions: Apply Kerlix 4.5 x 4.1 (in/yd) as instructed Secured With: Medipore Tape - 56M Medipore H Soft Cloth Surgical Tape, 2x2 (in/yd) 3 x Per Week/30 Days 1. I am get a recommend that we go ahead and continue with the wound care measures as before patient is in agreement with the plan this includes the use of the silver collagen to the medial aspect which I think  is doing well. 2. We will continue with the impregnated gauze with hydrogel to the wound bed followed by Xeroform to try to keep everything nice and moist and then subsequently the ABD pad and roll gauze to secure in place. We will see patient back for reevaluation in 1 week here in the clinic. If anything worsens or changes patient will contact our office for additional recommendations. Electronic Signature(s) Signed: 03/27/2022 11:57:33 AM By: Worthy Keeler PA-C Entered By: Worthy Keeler on 03/27/2022 11:57:33 Smalling, Nidal L. ([id number]) Pupo, [place] ([id number]) -------------------------------------------------------------------------------- SuperBill Details Patient Name: Stitzer, Darivs L. Date of Service: 03/27/2022 Medical Record Number: [id number] Patient Account Number: [id number] Date of Birth/Sex: [birth year] (56 y.o. 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 R26.89 Other abnormalities of gait and mobility Facility Procedures CPT4 Code: [id number] Description: 11042 - DEB SUBQ TISSUE 20 SQ CM/< Modifier: Quantity: 1 CPT4 Code: Description: ICD-10 Diagnosis Description L97.524 Non-pressure chronic ulcer of other part of left foot with necrosis of b Modifier: one Quantity: Physician Procedures CPT4 Code: 1607371 Description: 11042 - WC PHYS SUBQ TISS 20 SQ CM Modifier: Quantity:  1 CPT4 Code: Description: ICD-10 Diagnosis Description L97.524 Non-pressure chronic ulcer of other part of left foot with necrosis of b Modifier: one Quantity: Electronic Signature(s) Signed: 03/28/2022 9:01:56 AM By: [name] RN Signed: 03/28/2022 5:38:41  PM By: Worthy Keeler PA-C Entered By: [name] on 03/28/2022 09:01:56

## 2022-03-27 NOTE — Progress Notes (Addendum)
[name] ([id number]) Visit Report for 03/27/2022 Arrival Information Details Patient Name: [name], [name]. Date of Service: 03/27/2022 10:00 AM Medical Record Number: [id number] Patient Account Number: [id number] Date of Birth/Sex: [birth year] (56 y.o. M) Treating RN: Levora Dredge Primary Care Lorisa Scheid: [name] Other Clinician: Massie Kluver Referring Nanako Stopher: [name] [name]/Extender: [name] in Treatment: 21 Visit Information History Since Last Visit Added or deleted any medications: No Patient Arrived: Ambulatory Any new allergies or adverse reactions: No Arrival Time: 10:39 Had a fall or experienced change in No Transfer Assistance: None activities of daily living that may affect Patient Requires Transmission-Based No risk of falls: Precautions: Hospitalized since last visit: No Patient Has Alerts: Yes Pain Present Now: Yes Patient Alerts: Patient on Blood Thinner Electronic Signature(s) Signed: 03/28/2022 4:23:30 PM By: Massie Kluver Entered By: Massie Kluver on 03/27/2022 10:39:49 [name], [name] ([id number]) -------------------------------------------------------------------------------- Clinic Level of Care Assessment Details Patient Name: [name], [name] Date of Service: 03/27/2022 10:00 AM Medical Record Number: [id number] Patient Account Number: [id number] Date of Birth/Sex: 09-Sep-1966 (56 y.o. M) Treating RN: Levora Dredge Primary Care Jesse Nosbisch: [name] Other Clinician: Massie Kluver Referring Doyce Saling: [name] [name] Azoria Abbett/Extender: [name] in Treatment: 21 Clinic Level of Care Assessment Items TOOL 1 Quantity Score '[]'  - Use when EandM and Procedure is performed on INITIAL visit 0 ASSESSMENTS - Nursing Assessment / Reassessment '[]'  - General Physical Exam (combine w/ comprehensive assessment (listed just below) when performed on new 0 pt. evals) '[]'  - 0 Comprehensive Assessment (HX, ROS,  Risk Assessments, Wounds Hx, etc.) ASSESSMENTS - Wound and Skin Assessment / Reassessment '[]'  - Dermatologic / Skin Assessment (not related to wound area) 0 ASSESSMENTS - Ostomy and/or Continence Assessment and Care '[]'  - Incontinence Assessment and Management 0 '[]'  - 0 Ostomy Care Assessment and Management (repouching, etc.) PROCESS - Coordination of Care '[]'  - Simple Patient / Family Education for ongoing care 0 '[]'  - 0 Complex (extensive) Patient / Family Education for ongoing care '[]'  - 0 Staff obtains Programmer, systems, Records, Test Results / Process Orders '[]'  - 0 Staff telephones HHA, Nursing Homes / Clarify orders / etc '[]'  - 0 Routine Transfer to another Facility (non-emergent condition) '[]'  - 0 Routine Hospital Admission (non-emergent condition) '[]'  - 0 New Admissions / Biomedical engineer / Ordering NPWT, Apligraf, etc. '[]'  - 0 Emergency Hospital Admission (emergent condition) PROCESS - Special Needs '[]'  - Pediatric / Minor Patient Management 0 '[]'  - 0 Isolation Patient Management '[]'  - 0 Hearing / Language / Visual special needs '[]'  - 0 Assessment of Community assistance (transportation, D/C planning, etc.) '[]'  - 0 Additional assistance / Altered mentation '[]'  - 0 Support Surface(s) Assessment (bed, cushion, seat, etc.) INTERVENTIONS - Miscellaneous '[]'  - External ear exam 0 '[]'  - 0 Patient Transfer (multiple staff / Civil Service fast streamer / Similar devices) '[]'  - 0 Simple Staple / Suture removal (25 or less) '[]'  - 0 Complex Staple / Suture removal (26 or more) '[]'  - 0 Hypo/Hyperglycemic Management (do not check if billed separately) '[]'  - 0 Ankle / Brachial Index (ABI) - do not check if billed separately Has the patient been seen at the hospital within the last three years: Yes Total Score: 0 Level Of Care: ____ [name], [name] ([id number]) Electronic Signature(s) Signed: 03/28/2022 4:23:30 PM By: Massie Kluver Entered By: Massie Kluver on 03/27/2022 11:04:42 [name], [name]  ([id number]) -------------------------------------------------------------------------------- Encounter Discharge Information Details Patient Name: [name], [name] Date of Service: 03/27/2022 10:00 AM Medical Record Number:  [id number] Patient Account Number: [id number] Date of Birth/Sex: 1966-09-29 (56 y.o. M) Treating RN: Levora Dredge Primary Care Dannelle Rhymes: [name] Other Clinician: Massie Kluver Referring Taitum Menton: [name] [name] Christoffer Currier/Extender: [name] in Treatment: 21 Encounter Discharge Information Items Post Procedure Vitals Discharge Condition: Stable Temperature (F): 98.0 Ambulatory Status: Ambulatory Pulse (bpm): 84 Discharge Destination: Home Respiratory Rate (breaths/min): 18 Transportation: Private Auto Blood Pressure (mmHg): 108/72 Schedule Follow-up Appointment: No Clinical Summary of Care: Electronic Signature(s) Signed: 03/28/2022 4:23:30 PM By: Massie Kluver Entered By: Massie Kluver on 03/27/2022 11:56:55 [name], [name] ([id number]) -------------------------------------------------------------------------------- Lower Extremity Assessment Details Patient Name: [name], [name] Date of Service: 03/27/2022 10:00 AM Medical Record Number: [id number] Patient Account Number: [id number] Date of Birth/Sex: [birth year] (56 y.o. M) Treating RN: Levora Dredge Primary Care Mithra [name]: [name] Other Clinician: Massie Kluver Referring Saydee Zolman: [name] [name] Nathian Stencil/Extender: [name] in Treatment: 21 Electronic Signature(s) Signed: 03/27/2022 1:44:13 PM By: Levora Dredge Signed: 03/28/2022 4:23:30 PM By: Massie Kluver Entered By: Massie Kluver on 03/27/2022 10:45:32 [name] ([id number]) -------------------------------------------------------------------------------- Multi Wound Chart Details Patient Name: [name] L. Date of Service: 03/27/2022 10:00 AM Medical Record Number: [id number] Patient Account  Number: [id number] Date of Birth/Sex: December 21, 1965 (56 y.o. 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 Comorbid History: Cataracts, Anemia, Sleep Apnea, Cataracts, Anemia, Sleep Apnea, N/A Arrhythmia, Congestive Heart Arrhythmia, Congestive Heart Failure, Coronary Artery Disease, Failure, Coronary Artery Disease, Hypertension, Myocardial Infarction, Hypertension, Myocardial Infarction, Type II Diabetes, Neuropathy, Type II Diabetes, Neuropathy, Seizure Disorder Seizure Disorder Date Acquired: 09/09/2021 10/28/2021 N/A Weeks of Treatment: 17 17 N/A Wound Status: Open Open N/A Wound Recurrence: No No N/A Measurements L x W x D (cm) 2.6x0.7x0.5 3.5x1.5x0.5 N/A Area (cm) : 1.429 4.123 N/A Volume (cm) : 0.715 2.062 N/A % Reduction in Area: 89.90% -31.20% N/A % Reduction in Volume: 98.30% -556.70% N/A Classification: Full Thickness With Exposed Grade 2 N/A Support Structures Exudate Amount: Medium Medium N/A Exudate Type: Serosanguineous Serosanguineous N/A Exudate Color: red, brown red, brown N/A Granulation Amount: Large (67-100%) Small (1-33%) N/A Granulation Quality: Red, Pink Red N/A Necrotic Amount: Small (1-33%) Large (67-100%) N/A Necrotic Tissue: Adherent Catherine N/A Exposed Structures: Fat Layer (Subcutaneous Tissue): Fat Layer (Subcutaneous Tissue): N/A Yes Yes Fascia: No Tendon: Yes Tendon: No Muscle:  Yes Muscle: No Joint: Yes Joint: No Bone: Yes Bone: No Fascia: No Epithelialization: None None N/A Treatment Notes Electronic Signature(s) Marinos, Naziah L. ([id number]) Signed: 03/28/2022 4:23:30 PM By: Massie Kluver Entered By: Massie Kluver on 03/27/2022 10:54:52 [name], [name] ([id number]) -------------------------------------------------------------------------------- [place] Details Patient Name: [name], [name] M[place]ON[place]. Date of Service: 03/27/2022 10:00 AM Medical Record Number: [id number] Patient Account Number: [id number] Date of Birth/Sex: 1965-12-12 (56 y.o. M) Treating RN: Levora Dredge Primary Care Vianey Caniglia: [name] Other Clinician: Massie Kluver Referring Kashden Deboy: [name] [name] Cheo Selvey/Extender: [name] in Treatment: 21 Active Inactive HBO Nursing Diagnoses: Anxiety related to feelings of confinement associated with the hyperbaric oxygen chamber Anxiety related to knowledge deficit of hyperbaric oxygen therapy and treatment procedures Discomfort related to temperature and humidity changes  inside hyperbaric chamber Potential for barotraumas to ears, sinuses, teeth, and lungs or cerebral gas embolism related to changes in atmospheric pressure inside hyperbaric oxygen chamber Potential for oxygen toxicity seizures related to delivery of 100% oxygen at an increased atmospheric pressure Potential for pulmonary oxygen toxicity related to delivery of 100% oxygen at an increased atmospheric pressure Goals: Barotrauma will be prevented during HBO2 Date Initiated: 10/31/2021 Target Resolution Date: 12/06/2021 Goal Status: Active Patient and/or family will be able to state/discuss factors appropriate to the management of their disease process during treatment Date Initiated: 10/31/2021 Date Inactivated: 02/14/2022 Target Resolution Date: 11/15/2021 Goal Status: Met Patient will tolerate the hyperbaric oxygen therapy treatment Date  Initiated: 10/31/2021 Target Resolution Date: 12/06/2021 Goal Status: Active Patient will tolerate the internal climate of the chamber Date Initiated: 10/31/2021 Date Inactivated: 02/14/2022 Target Resolution Date: 11/29/2021 Goal Status: Met Patient/caregiver will verbalize understanding of HBO goals, rationale, procedures and potential hazards Date Initiated: 10/31/2021 Date Inactivated: 02/14/2022 Target Resolution Date: 11/19/2021 Goal Status: Met Signs and symptoms of pulmonary oxygen toxicity will be recognized and promptly addressed Date Initiated: 10/31/2021 Date Inactivated: 02/14/2022 Target Resolution Date: 12/13/2021 Goal Status: Met Signs and symptoms of seizure will be recognized and promptly addressed ; seizing patients will suffer no harm Date Initiated: 10/31/2021 Target Resolution Date: 12/20/2021 Goal Status: Active Interventions: Administer a five (5) minute air break for patient if signs and symptoms of seizure appear and notify the hyperbaric physician Administer a ten (10) minute air break for patient if signs and symptoms of seizure appear and notify the hyperbaric physician Administer the correct therapeutic gas delivery based on the patients needs and limitations, per physician order Assess and provide for patientos comfort related to the hyperbaric environment and equalization of middle ear Assess for signs and symptoms related to adverse events, including but not limited to confinement anxiety, pneumothorax, oxygen toxicity and baurotrauma Assess patient for any history of confinement anxiety Assess patient's knowledge and expectations regarding hyperbaric medicine and provide education related to the hyperbaric environment, goals of treatment and prevention of adverse events Notes: Necrotic Tissue Nursing Diagnoses: Impaired tissue integrity related to necrotic/devitalized tissue [name], [name] ([id number]) Knowledge deficit related to management of  necrotic/devitalized tissue Goals: Necrotic/devitalized tissue will be minimized in the wound bed Date Initiated: 11/28/2021 Target Resolution Date: 11/28/2021 Goal Status: Active Patient/caregiver will verbalize understanding of reason and process for debridement of necrotic tissue Date Initiated: 11/28/2021 Date Inactivated: 02/14/2022 Target Resolution Date: 11/28/2021 Goal Status: Met Interventions: Assess patient pain level pre-, during and post procedure and prior to discharge Provide education on necrotic tissue and debridement process Treatment Activities: Apply topical anesthetic as ordered : 11/28/2021 Excisional debridement : 11/28/2021 Notes: Wound/Skin Impairment Nursing Diagnoses: Impaired tissue integrity Knowledge deficit related to smoking impact on wound healing Knowledge deficit related to ulceration/compromised skin integrity Goals: Ulcer/skin breakdown will have a volume reduction of 30% by week 4 Date Initiated: 11/28/2021 Target Resolution Date: 12/12/2021 Goal Status: Active Interventions: Assess patient/caregiver ability to obtain necessary supplies Assess patient/caregiver ability to perform ulcer/skin care regimen upon admission and as needed Assess ulceration(s) every visit Treatment Activities: Topical wound management initiated : 11/28/2021 Notes: Electronic Signature(s) Signed: 03/27/2022 1:44:13 PM By: Levora Dredge Signed: 03/28/2022 4:23:30 PM By: Massie Kluver Entered By: Massie Kluver on 03/27/2022 10:54:42 [name], [name] ([id number]) -------------------------------------------------------------------------------- Pain Assessment Details Patient Name: [name], [name] Date of Service: 03/27/2022 10:00 AM Medical Record Number: [id number] Patient Account Number: [id number] Date of Birth/Sex: 01-07-1966 (56 y.o. M) Treating  RN: Levora Dredge Primary Care Dewell Monnier: [name] Other Clinician: Massie Kluver Referring Kyung Muto: [name] [name]  Jalyn Dutta/Extender: [name] in Treatment: 21 Active Problems Location of Pain Severity and Description of Pain Patient Has Paino Yes Site Locations Pain Location: Pain in Ulcers Duration of the Pain. Constant / Intermittento Constant Rate the pain. Current Pain Level: 6 Character of Pain Describe the Pain: Throbbing Pain Management and Medication Current Pain Management: Rest: Yes Electronic Signature(s) Signed: 03/27/2022 1:44:13 PM By: Levora Dredge Signed: 03/28/2022 4:23:30 PM By: Massie Kluver Entered By: Massie Kluver on 03/27/2022 10:43:30 Gombert, Alda ([id number]) -------------------------------------------------------------------------------- Patient/Caregiver Education Details Patient Name: [place] [name], [name] Date of Service: 03/27/2022 10:00 AM Medical Record Number: [id number] Patient Account Number: [id number] Date of Birth/Gender: 01-02-1966 (56 y.o. M) Treating RN: Levora Dredge Primary Care Physician: [name] Other Clinician: Massie Kluver Referring Physician: [name] [name] Physician/Extender: [name] in Treatment: 21 Education Assessment Education Provided To: Patient Education Topics Provided Wound/Skin Impairment: Handouts: Other: continue wound care as directed Methods: Explain/Verbal Responses: State content correctly Electronic Signature(s) Signed: 03/28/2022 4:23:30 PM By: Massie Kluver Entered By: Massie Kluver on 03/27/2022 11:05:40 [place], [place] ([id number]) -------------------------------------------------------------------------------- Wound Assessment Details Patient Name: [name], [name] Date of Service: 03/27/2022 10:00 AM Medical Record Number: [id number] Patient Account Number: [id number] Date of Birth/Sex: 18-Mar-1966 (56 y.o. M) Treating RN: Levora Dredge Primary Care Tonya Wantz: [name] Other Clinician: Massie Kluver Referring Glynda Soliday: [name] [name] Chayse Zatarain/Extender: [name] in Treatment: 21 Wound Status Wound Number: 1 Primary Open Surgical Wound Etiology: Wound Location: Left Amputation Site - Digit Wound Open Wounding Event: Surgical Injury Status: Date Acquired: 09/09/2021 Comorbid Cataracts, Anemia, Sleep Apnea, Arrhythmia, Congestive Weeks Of Treatment: 17 History: Heart Failure, Coronary Artery Disease, Hypertension, Clustered Wound: No Myocardial Infarction, Type II Diabetes, Neuropathy, Seizure Disorder Photos Wound Measurements Length: (cm) 2.6 Width: (cm) 0.7 Depth: (cm) 0.5 Area: (cm) 1.429 Volume: (cm) 0.715 % Reduction in Area: 89.9% % Reduction in Volume: 98.3% Epithelialization: None Wound Description Classification: Full Thickness With Exposed Support Structures Exudate Amount: Medium Exudate Type: Serosanguineous Exudate Color: red, brown Foul Odor After Cleansing: No Slough/Fibrino No Wound Bed Granulation Amount: Large (67-100%) Exposed Structure Granulation Quality: Red, Pink Fascia Exposed: No Necrotic Amount: Small (1-33%) Fat Layer (Subcutaneous Tissue) Exposed: Yes Necrotic Quality: Adherent Slough Tendon Exposed: No Muscle Exposed: No Joint Exposed: No Bone Exposed: No Treatment Notes Wound #1 (Amputation Site - Digit) Wound Laterality: Left Cleanser Wound Cleanser Discharge Instruction: Wash your hands with soap and water. Remove old dressing, discard into plastic bag and place into trash. Cleanse the wound with Wound Cleanser prior to applying a clean dressing using gauze sponges, not tissues or cotton balls. Do not Kirsten, [place] ([id number]) scrub or use excessive force. Pat dry using gauze sponges, not tissue or cotton balls. Peri-Wound Care Topical Primary Dressing Prisma 4.34 (in) Discharge Instruction: Moisten w/normal saline or sterile water; Cover wound as directed. Do not remove from wound bed. Secondary Dressing Secured With Medipore Tape - 30M Medipore H Soft Cloth Surgical  Tape, 2x2 (in/yd) Kerlix Roll Sterile or Non-Sterile 6-ply 4.5x4 (yd/yd) Discharge Instruction: Apply Kerlix as directed ABD pad Compression Wrap Compression Stockings Add-Ons Electronic Signature(s) Signed: 03/27/2022 1:44:13 PM By: Levora Dredge Signed: 03/28/2022 4:23:30 PM By: Massie Kluver Entered By: Massie Kluver on 03/27/2022 10:44:53 Silas, [place] ([id number]) -------------------------------------------------------------------------------- Wound Assessment Details Patient Name: Thorstenson, Daiton L. Date of Service: 03/27/2022 10:00 AM Medical Record Number: [id number] Patient Account Number:  [id number] Date of Birth/Sex: 1966-04-26 (56 y.o. M) Treating RN: Levora Dredge Primary Care Naphtali Zywicki: [name] Other Clinician: Massie Kluver Referring Maimuna Leaman: [name] [name] Anesa Fronek/Extender: [name] in Treatment: 21 Wound Status Wound Number: 2 Primary Diabetic Wound/Ulcer of the Lower Extremity Etiology: Wound Location: Left, Lateral Foot Wound Open Wounding Event: Blister Status: Date Acquired: 10/28/2021 Comorbid Cataracts, Anemia, Sleep Apnea, Arrhythmia, Congestive Weeks Of Treatment: 17 History: Heart Failure, Coronary Artery Disease, Hypertension, Clustered Wound: No Myocardial Infarction, Type II Diabetes, Neuropathy, Seizure Disorder Photos Wound Measurements Length: (cm) 3.5 Width: (cm) 1.5 Depth: (cm) 0.5 Area: (cm) 4.123 Volume: (cm) 2.062 % Reduction in Area: -31.2% % Reduction in Volume: -556.7% Epithelialization: None Wound Description Classification: Grade 2 Exudate Amount: Medium Exudate Type: Serosanguineous Exudate Color: red, brown Foul Odor After Cleansing: No Slough/Fibrino Yes Wound Bed Granulation Amount: Small (1-33%) Exposed Structure Granulation Quality: Red Fascia Exposed: No Necrotic Amount: Large (67-100%) Fat Layer (Subcutaneous Tissue) Exposed: Yes Necrotic Quality: Eschar, Adherent Slough Tendon  Exposed: Yes Muscle Exposed: Yes Necrosis of Muscle: Yes Joint Exposed: Yes Bone Exposed: Yes Treatment Notes Wound #2 (Foot) Wound Laterality: Left, Lateral Cleanser Wound Cleanser Discharge Instruction: Wash your hands with soap and water. Remove old dressing, discard into plastic bag and place into trash. Cleanse the wound with Wound Cleanser prior to applying a clean dressing using gauze sponges, not tissues or cotton balls. Do not Ran, [place] ([id number]) scrub or use excessive force. Pat dry using gauze sponges, not tissue or cotton balls. Peri-Wound Care Topical Primary Dressing Xeroform 4x4-HBD (in/in) Discharge Instruction: Apply Xeroform 4x4-HBD (in/in) as directed Secondary Dressing ABD Pad 5x9 (in/in) Discharge Instruction: Cover with ABD pad Gauze Discharge Instruction: moistened with surgical lube Kerlix 4.5 x 4.1 (in/yd) Discharge Instruction: Apply Kerlix 4.5 x 4.1 (in/yd) as instructed Secured With [place] H Soft Cloth Surgical Tape, 2x2 (in/yd) Compression Wrap Compression Stockings Add-Ons Electronic Signature(s) Signed: 03/27/2022 1:44:13 PM By: Levora Dredge Signed: 03/28/2022 4:23:30 PM By: Massie Kluver Entered By: Massie Kluver on 03/27/2022 10:45:17 Roedel, [place] L. ([id number]) -------------------------------------------------------------------------------- Vitals Details Patient Name: Covello, Aaryav L. Date of Service: 03/27/2022 10:00 AM Medical Record Number: [id number] Patient Account Number: [id number] Date of Birth/Sex: [birth year] (56 y.o. M) Treating RN: Levora Dredge Primary Care Jotham Ahn: [name] Other Clinician: Massie Kluver Referring Sadat Sliwa: [name] [name] Lyna [place]ingham/Extender: [name] in Treatment: 21 Vital Signs Time Taken: 10:25 Temperature (F): 98.0 Height (in): 70 Pulse (bpm): 84 Weight (lbs): 180.4 Respiratory Rate (breaths/min): 18 Body Mass Index (BMI): 25.9 Blood Pressure  (mmHg): 108/72 Reference Range: 80 - 120 mg / dl Electronic Signature(s) Signed: 03/28/2022 4:23:30 PM By: Massie Kluver Entered By: Massie Kluver on 03/27/2022 10:40:26

## 2022-03-27 NOTE — Progress Notes (Addendum)
Lind[place], [place] ([id number]) Visit Report for 03/27/2022 HBO Details Patient Name: [name], [name] Date of Service: 03/27/2022 8:00 AM Medical Record Number: [id number] Patient Account Number: [id number] Date of Birth/Sex: Aug 10, 1966 (56 y.o. 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 HBO Treatment Details Treatment Number: 95 Patient Type: Outpatient Chamber Type: Monoplace Chamber Serial #: [id number] Treatment Protocol: 2.0 ATA with 90 minutes oxygen, and no air breaks Treatment Details Compression Rate Down: 1.5 psi / minute De-Compression Rate Up: 2.0 psi / minute Compress Tx Pressure Air breaks and breathing periods Decompress Decompress Begins Reached (leave unused spaces blank) Begins Ends Chamber Pressure (ATA) 1 2 - - - - - - 2 1 Clock Time (24 hr) 08:09 08:19 - - - - - - 09:49 09:58 Treatment Length: 109 (minutes) Treatment Segments: 4 Vital Signs Capillary Blood Glucose Reference Range: 80 - 120 mg / dl HBO Diabetic Blood Glucose Intervention Range: <131 mg/dl or >249 mg/dl Time Vitals Blood Respiratory Capillary Blood Glucose Pulse Action Type: Pulse: Temperature: Taken: Pressure: Rate: Glucose (mg/dl): Meter #: Oximetry (%) Taken: Pre 08:03 108/72 84 18 98 141 1 none per protocol Post 10:01 158/88 72 18 98.1 190 1 none per protocol Treatment Response Treatment Toleration: Well Treatment Completion Treatment Completed without Adverse Event Status: Electronic Signature(s) Signed: 03/27/2022 10:11:08 AM By: Donavan Burnet CHT, EMT, BS Signed: 03/27/2022 4:11:43 PM By: Worthy Keeler PA-C Previous  Signature: 03/27/2022 9:44:52 AM Version By: Donavan Burnet CHT, EMT, BS Entered By: Donavan Burnet on 03/27/2022 10:11:07 [place], [place] ([id number]) -------------------------------------------------------------------------------- HBO Safety Checklist Details Patient Name: [name], [name] Date of Service: 03/27/2022 8:00 AM Medical Record Number: [id number] Patient Account Number: [id number] Date of Birth/Sex: 09/25/1966 (56 y.o. M) Treating RN: [name] Primary Care Shammond [name]: [name] Other Clinician: Donavan Burnet Referring Stephanee Barcomb: [name] Treating Jamiaya Bina/Extender: [name] in Treatment: 21 HBO Safety Checklist Items Safety Checklist Consent Form Signed Patient voided / foley secured and emptied When did you last eato 0700 Last dose of injectable or oral agent N/A Ostomy pouch emptied and vented if applicable NA All implantable devices assessed, documented and approved NA Intravenous access site secured and place NA Valuables secured Linens and cotton and cotton/polyester blend (less than 51% polyester) Personal oil-based products / skin lotions / body lotions removed Wigs or hairpieces removed NA Smoking or tobacco materials removed NA Books / newspapers / magazines / loose paper removed Cologne, aftershave, perfume and deodorant removed Jewelry removed (may wrap wedding band) Make-up removed NA Hair care products removed Battery operated devices (external) removed Heating patches and chemical warmers removed Titanium eyewear removed NA Nail polish cured greater than 10 hours NA Casting material cured greater than 10 hours NA Hearing aids removed NA Loose dentures or partials removed NA Prosthetics have been removed NA Patient demonstrates correct use of air break device (if applicable) Patient concerns have been addressed Patient grounding bracelet on and cord attached to chamber Specifics for Inpatients (complete in  addition to above) NA Medication sheet sent with patient Intravenous medications needed or due during therapy sent with patient NA Drainage tubes (e.g. nasogastric tube or chest tube secured and vented) NA Endotracheal or Tracheotomy tube secured NA Cuff deflated of air  and inflated with saline NA Airway suctioned NA Notes Paper version used prior to treatment. Electronic Signature(s) Signed: 03/27/2022 9:15:28 AM By: Donavan Burnet CHT, EMT, BS Entered By: Donavan Burnet on 03/27/2022 21:97:58

## 2022-03-28 ENCOUNTER — Ambulatory Visit: Payer: Medicare Other | Admitting: Physician Assistant

## 2022-03-28 ENCOUNTER — Encounter: Payer: Medicare Other | Admitting: Physician Assistant

## 2022-03-28 DIAGNOSIS — N2581 Secondary hyperparathyroidism of renal origin: Secondary | ICD-10-CM | POA: Diagnosis not present

## 2022-03-28 DIAGNOSIS — E1122 Type 2 diabetes mellitus with diabetic chronic kidney disease: Secondary | ICD-10-CM | POA: Diagnosis not present

## 2022-03-28 DIAGNOSIS — D631 Anemia in chronic kidney disease: Secondary | ICD-10-CM | POA: Diagnosis not present

## 2022-03-28 DIAGNOSIS — Z992 Dependence on renal dialysis: Secondary | ICD-10-CM | POA: Diagnosis not present

## 2022-03-28 DIAGNOSIS — I96 Gangrene, not elsewhere classified: Secondary | ICD-10-CM | POA: Diagnosis not present

## 2022-03-28 DIAGNOSIS — E11621 Type 2 diabetes mellitus with foot ulcer: Secondary | ICD-10-CM | POA: Diagnosis not present

## 2022-03-28 DIAGNOSIS — I132 Hypertensive heart and chronic kidney disease with heart failure and with stage 5 chronic kidney disease, or end stage renal disease: Secondary | ICD-10-CM | POA: Diagnosis not present

## 2022-03-28 DIAGNOSIS — M86672 Other chronic osteomyelitis, left ankle and foot: Secondary | ICD-10-CM | POA: Diagnosis not present

## 2022-03-28 DIAGNOSIS — N186 End stage renal disease: Secondary | ICD-10-CM | POA: Diagnosis not present

## 2022-03-28 DIAGNOSIS — L97524 Non-pressure chronic ulcer of other part of left foot with necrosis of bone: Secondary | ICD-10-CM | POA: Diagnosis not present

## 2022-03-28 LAB — GLUCOSE, CAPILLARY
Glucose-Capillary: 151 mg/dL — ABNORMAL HIGH (ref 70–99)
Glucose-Capillary: 134 mg/dL — ABNORMAL HIGH (ref 70–99)

## 2022-03-28 NOTE — Progress Notes (Signed)
Scatena, [place] ([id number]) Visit Report for 03/28/2022 HBO Details Patient Name: [name], [name] Date of Service: 03/28/2022 11:00 AM Medical Record Number: [id number] Patient Account Number: [id number] Date of Birth/Sex: Nov 07, 1965 (56 y.o. M) Treating RN: [name] Primary Care Jerianne Anselmo: [name] Other Clinician: Donavan Burnet Referring Josely Moffat: [name] Treating Zeven Kocak/Extender: [name] in Treatment: 21 HBO Treatment Course Details Treatment Course Number: 1 Ordering Burnard Enis: Jeri Cos Total Treatments Ordered: 100 HBO Treatment Start Date: 11/04/2021 HBO Indication: Chronic Refractory Osteomyelitis to Left Foot HBO Treatment Details Treatment Number: 96 Patient Type: Outpatient Chamber Type: Monoplace Chamber Serial #: [id number] Treatment Protocol: 2.0 ATA with 90 minutes oxygen, and no air breaks Treatment Details Compression Rate Down: 1.5 psi / minute De-Compression Rate Up: 2.0 psi / minute Compress Tx Pressure Air breaks and breathing periods Decompress Decompress Begins Reached (leave unused spaces blank) Begins Ends Chamber Pressure (ATA) 1 2 - - - - - - 2 1 Clock Time (24 hr) 09:39 09:49 - - - - - - 11:19 11:28 Treatment Length: 109 (minutes) Treatment Segments: 4 Vital Signs Capillary Blood Glucose Reference Range: 80 - 120 mg / dl HBO Diabetic Blood Glucose Intervention Range: <131 mg/dl or >249 mg/dl Time Vitals Blood Respiratory Capillary Blood Glucose Pulse Action Type: Pulse: Temperature: Taken: Pressure: Rate: Glucose (mg/dl): Meter #: Oximetry (%) Taken: Pre 09:31 124/72 78 14 98.3 151 1 none per protocol Post 11:31 122/78 84 14 97.8 134 1 none per protocol Treatment Response Treatment Toleration: Well Treatment Completion Treatment Completed without Adverse Event Status: HBO Attestation I certify that I supervised this HBO treatment in accordance with Medicare guidelines. A trained emergency response team is readily  Yes available per hospital policies and procedures. Continue HBOT as ordered. Yes Electronic Signature(s) Signed: 03/28/2022 5:38:21 PM By: Worthy Keeler PA-C Previous Signature: 03/28/2022 11:40:37 AM Version By: Donavan Burnet CHT, EMT, BS Previous Signature: 03/28/2022 9:51:33 AM Version By: Donavan Burnet CHT, EMT, BS Entered By: Worthy Keeler on 03/28/2022 17:38:20 [name], [name] ([id number]) -------------------------------------------------------------------------------- HBO Safety Checklist Details Patient Name: [name], [name] Date of Service: 03/28/2022 11:00 AM Medical Record Number: [id number] Patient Account Number: [id number] Date of Birth/Sex: 16-May-1966 (56 y.o. M) Treating RN: [name] Primary Care Ahmia Colford: [name] Other Clinician: Donavan Burnet Referring Thressa Shiffer: [name] Treating Eilish Mcdaniel/Extender: [name] in Treatment: 21 HBO Safety Checklist Items Safety Checklist Consent Form Signed Patient voided / foley secured and emptied When did you last eato 0845 Last dose of injectable or oral agent Ostomy pouch emptied and vented if applicable NA All implantable devices assessed, documented and approved NA Intravenous access site secured and place NA Valuables secured Linens and cotton and cotton/polyester blend (less than 51% polyester) Personal oil-based products / skin lotions / body lotions removed Wigs or hairpieces removed NA Smoking or tobacco materials removed NA Books / newspapers / magazines / loose paper removed Cologne, aftershave, perfume and deodorant removed Jewelry removed (may wrap wedding band) Make-up removed NA Hair care products removed Battery operated devices (external) removed Heating patches and chemical warmers removed Titanium eyewear removed NA Nail polish cured greater than 10 hours NA Casting material cured greater than 10 hours NA Hearing aids removed NA Loose dentures or partials  removed NA Prosthetics have been removed NA Patient demonstrates correct use of air break device (if applicable) Patient concerns have been addressed Patient grounding bracelet on and cord attached to chamber Specifics for Inpatients (complete in addition to above) NA Medication sheet  sent with patient Intravenous medications needed or due during therapy sent with patient NA Drainage tubes (e.g. nasogastric tube or chest tube secured and vented) NA Endotracheal or Tracheotomy tube secured NA Cuff deflated of air and inflated with saline NA Airway suctioned NA Notes Paper version used prior to treatment. Electronic Signature(s) Signed: 03/28/2022 9:44:45 AM By: Donavan Burnet CHT, EMT, BS Entered By: Donavan Burnet on 03/28/2022 09:44:45

## 2022-03-28 NOTE — Progress Notes (Addendum)
[place], [place] ([id number]) Visit Report for 03/28/2022 Arrival Information Details Patient Name: [name], [name]. Date of Service: 03/28/2022 11:00 AM Medical Record Number: [id number] Patient Account Number: [id number] Date of Birth/Sex: 01/24/1966 (56 y.o. M) Treating RN: [name] Primary Care Antoin Dargis: [name] Other Clinician: Donavan Burnet Referring Kaynen Minner: [name] Treating Darreld Hoffer/Extender: [name] in Treatment: 21 Visit Information History Since Last Visit All ordered tests and consults were completed: Yes Patient Arrived: Ambulatory Added or deleted any medications: No Arrival Time: 09:24 Any new allergies or adverse reactions: No Accompanied By: self Had a fall or experienced change in No Transfer Assistance: None activities of daily living that may affect Patient Identification Verified: Yes risk of falls: Secondary Verification Process Completed: Yes Signs or symptoms of abuse/neglect since last visito No Patient Requires Transmission-Based No Hospitalized since last visit: No Precautions: Implantable device outside of the clinic excluding No Patient Has Alerts: Yes cellular tissue based products placed in the center Patient Alerts: Patient on Blood since last visit: Thinner Pain Present Now: No Electronic Signature(s) Signed: 03/28/2022 9:42:49 AM By: Donavan Burnet CHT, EMT, BS Entered By: Donavan Burnet on 03/28/2022 09:42:49 [name], [name] ([id number]) -------------------------------------------------------------------------------- Encounter Discharge Information Details Patient Name: [name], [name] Date of Service: 03/28/2022 11:00 AM Medical Record Number: [id number] Patient Account Number: [id number] Date of Birth/Sex: [birth year] (56 y.o. M) Treating RN: [name] Primary Care Jayse Hodkinson: [name] Other Clinician: Donavan Burnet Referring Cesare Sumlin: [name] Treating Shariyah Eland/Extender: [name] in  Treatment: 21 Encounter Discharge Information Items Discharge Condition: Stable Ambulatory Status: Ambulatory Discharge Destination: Home Transportation: Private Auto Accompanied By: self Schedule Follow-up Appointment: No Clinical Summary of Care: Electronic Signature(s) Signed: 03/28/2022 11:41:29 AM By: Donavan Burnet CHT, EMT, BS Entered By: Donavan Burnet on 03/28/2022 11:41:29 [name], [name] ([id number]) -------------------------------------------------------------------------------- Vitals Details Patient Name: [name], [name] Date of Service: 03/28/2022 11:00 AM Medical Record Number: [id number] Patient Account Number: [id number] Date of Birth/Sex: [birth year] (56 y.o. M) Treating RN: [name] Primary Care Naijah Lacek: [name] Other Clinician: Donavan Burnet Referring Ty Buntrock: [name] Treating Evelina Lore/Extender: [name] in Treatment: 21 Vital Signs Time Taken: 09:31 Temperature (F): 98.3 Height (in): 70 Pulse (bpm): 78 Weight (lbs): 180.4 Respiratory Rate (breaths/min): 14 Body Mass Index (BMI): 25.9 Blood Pressure (mmHg): 124/72 Capillary Blood Glucose (mg/dl): 151 Reference Range: 80 - 120 mg / dl Electronic Signature(s) Signed: 03/28/2022 9:43:08 AM By: Donavan Burnet CHT, EMT, BS Entered By: Donavan Burnet on 03/28/2022 09:43:07

## 2022-03-29 NOTE — Progress Notes (Signed)
[place], [place] ([id number]) Visit Report for 03/28/2022 Problem List Details Patient Name: [name], [name]. Date of Service: 03/28/2022 11:00 AM Medical Record Number: [id number] Patient Account Number: [id number] Date of Birth/Sex: [birth year] (56 y.o. 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 ([id number]) -------------------------------------------------------------------------------- SuperBill Details Patient Name: [name], [name] Date of Service: 03/28/2022 Medical Record Number: [id number] Patient Account Number: [id number] Date of Birth/Sex: 13-Jan-1966 (56 y.o. 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 R26.89 Other abnormalities of gait and mobility Facility Procedures CPT4 Code: 29518841 Description: (Facility Use Only) HBOT, full body chamber, 29min Modifier: Quantity: 4 CPT4 Code: Description: ICD-10 Diagnosis Description [phone] Other chronic osteomyelitis, left ankle and foot 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.621 Type 2 diabetes mellitus with foot ulcer Modifier: e Quantity: Physician Procedures CPT4 Code: 1601093 Description: 23557 - WC PHYS HYPERBARIC OXYGEN THERAPY Modifier: Quantity: 1 CPT4 Code: Description: ICD-10 Diagnosis Description M86.672 Other chronic osteomyelitis, left ankle and foot 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

## 2022-03-31 ENCOUNTER — Ambulatory Visit (INDEPENDENT_AMBULATORY_CARE_PROVIDER_SITE_OTHER): Payer: Medicare Other

## 2022-03-31 ENCOUNTER — Encounter: Payer: Medicare Other | Admitting: Physician Assistant

## 2022-03-31 ENCOUNTER — Ambulatory Visit (INDEPENDENT_AMBULATORY_CARE_PROVIDER_SITE_OTHER): Payer: BC Managed Care – PPO | Admitting: Podiatry

## 2022-03-31 DIAGNOSIS — D631 Anemia in chronic kidney disease: Secondary | ICD-10-CM | POA: Diagnosis not present

## 2022-03-31 DIAGNOSIS — Z5181 Encounter for therapeutic drug level monitoring: Secondary | ICD-10-CM | POA: Diagnosis not present

## 2022-03-31 DIAGNOSIS — Z5189 Encounter for other specified aftercare: Secondary | ICD-10-CM

## 2022-03-31 DIAGNOSIS — I96 Gangrene, not elsewhere classified: Secondary | ICD-10-CM | POA: Diagnosis not present

## 2022-03-31 DIAGNOSIS — Z992 Dependence on renal dialysis: Secondary | ICD-10-CM | POA: Diagnosis not present

## 2022-03-31 DIAGNOSIS — N2581 Secondary hyperparathyroidism of renal origin: Secondary | ICD-10-CM | POA: Diagnosis not present

## 2022-03-31 DIAGNOSIS — N186 End stage renal disease: Secondary | ICD-10-CM | POA: Diagnosis not present

## 2022-03-31 DIAGNOSIS — L02612 Cutaneous abscess of left foot: Secondary | ICD-10-CM | POA: Diagnosis not present

## 2022-03-31 NOTE — Progress Notes (Signed)
Subjective:  56 y.o. male with PMHx of diabetes mellitus with recurrent ulcers who is being managed at the [facility] wound care center presents today for evaluation of nonhealing wounds to the left foot.  Past surgical history TMA right foot.  Referred here from the wound care center.  He presents for further treatment and evaluation  Past Medical History:  Diagnosis Date   Acute osteomyelitis of right foot ([place]) 05/15/2019   Anemia 32/35/5732   Complication of anesthesia    Coronary artery disease    Diabetes with renal manifestations(250.4) 08/23/2013   Diabetic infection of right foot ([place])    Diabetic retinopathy associated with type 2 diabetes mellitus ([place]) 06/23/2019   ESRD on hemodialysis ([place]) 07/18/2011   Essential hypertension 10/18/2007   Qualifier: Diagnosis of  By: Loanne Drilling MD, Hilliard Clark A    History of blood transfusion    HYPERLIPIDEMIA 05/16/2008   Qualifier: Diagnosis of  By: Jenny Reichmann MD, Hunt Oris    MRSA INFECTION 10/18/2007   Qualifier: Diagnosis of  By: Marca Ancona RMA, Lucy     Other forms of retinal detachment(361.89) 06/23/2019   PONV (postoperative nausea and vomiting)    Secondary hyperparathyroidism ([place]) 06/23/2019   Sepsis ([place]) 05/14/2019   Past Surgical History:  Procedure Laterality Date   ABDOMINAL AORTOGRAM N/A 05/17/2019   Procedure: ABDOMINAL AORTOGRAM;  Surgeon: Elam Dutch, MD;  Location: [place] [facility];  Service: Cardiovascular;  Laterality: N/A;   AMPUTATION TOE Right 05/14/2019   Procedure: PARTIAL AMPUTATION SECOND TOE RIGHT FOOT;  Surgeon: Evelina Bucy, DPM;  Location: [place];  Service: Podiatry;  Laterality: Right;   AMPUTATION TOE Right 05/23/2019   Procedure: AMPUTATION OF SECOND TOE METATARSAL PHALANGEAL JOINT;  Surgeon: Evelina Bucy, DPM;  Location: [place];  Service: Podiatry;  Laterality: Right;   AMPUTATION TOE Right 05/27/2019   Procedure: Amputation Toe;  Surgeon: Evelina Bucy, DPM;  Location: [place];  Service: Podiatry;  Laterality:  Right;  right third toe   APPLICATION OF WOUND VAC Right 05/27/2019   Procedure: Application Of Wound Vac;  Surgeon: Evelina Bucy, DPM;  Location: [place];  Service: Podiatry;  Laterality: Right;   AV FISTULA PLACEMENT  06-18-11   Right brachiocephalic AVF   BONE BIOPSY Right 05/14/2019   Procedure: OPEN SUPERFICIAL BONE BIOPSY GREAT TOE;  Surgeon: Evelina Bucy, DPM;  Location: [place];  Service: Podiatry;  Laterality: Right;   CARDIAC CATHETERIZATION     CORONARY BALLOON ANGIOPLASTY N/A 09/24/2020   Procedure: CORONARY BALLOON ANGIOPLASTY;  Surgeon: Martinique, Peter M, MD;  Location: [place] [facility];  Service: Cardiovascular;  Laterality: N/A;   CORONARY STENT INTERVENTION N/A 09/24/2020   Procedure: CORONARY STENT INTERVENTION;  Surgeon: Martinique, Peter M, MD;  Location: [place] [facility];  Service: Cardiovascular;  Laterality: N/A;   EYE SURGERY     left eye for Laser, diabetic retinopathy   FISTULOGRAM Right 11/06/2011   Procedure: FISTULOGRAM;  Surgeon: Conrad Blodgett Mills, MD;  Location: [facility];  Service: Cardiovascular;  Laterality: Right;   HEMATOMA EVACUATION Right Feb. 25, 2014   HEMATOMA EVACUATION Right 12/14/2012   Procedure: EVACUATION HEMATOMA;  Surgeon: Conrad Woodside [place], MD;  Location: [place];  Service: Vascular;  Laterality: Right;   INCISION AND DRAINAGE Right 05/23/2019   Procedure: INCISION AND DRAINAGE OF RIGHT FOOT DEEP SPACE ABSCESS;  Surgeon: Evelina Bucy, DPM;  Location: [place];  Service: Podiatry;  Laterality: Right;   INSERTION OF DIALYSIS CATHETER  12/14/2012   Procedure: INSERTION OF DIALYSIS CATHETER;  Surgeon: Conrad Kidder, MD;  Location: [place];  Service: Vascular;;   INSERTION OF DIALYSIS CATHETER Right 01/07/2021   Procedure: INSERTION OF DIALYSIS CATHETER;  Surgeon: Elam Dutch, MD;  Location: [place];  Service: Vascular;  Laterality: Right;   INSERTION OF DIALYSIS CATHETER Left 01/08/2021   Procedure: INSERTION OF LEFT INTERNAL JUGULAR DIALYSIS CATHETER, tunneled;   Surgeon: Angelia Mould, MD;  Location: [place];  Service: Vascular;  Laterality: Left;   INTRAVASCULAR PRESSURE WIRE/FFR STUDY N/A 09/24/2020   Procedure: INTRAVASCULAR PRESSURE WIRE/FFR STUDY;  Surgeon: Martinique, Peter M, MD;  Location: [place] [facility];  Service: Cardiovascular;  Laterality: N/A;   IRRIGATION AND DEBRIDEMENT FOOT Right 05/25/2019   Procedure: INCISION AND DRAINAGE FOOT;  Surgeon: Evelina Bucy, DPM;  Location: [place];  Service: Podiatry;  Laterality: Right;   IRRIGATION AND DEBRIDEMENT FOOT Right 05/27/2019   Procedure: IRRIGATION AND DEBRIDEMENT FOOT;  Surgeon: Evelina Bucy, DPM;  Location: [place];  Service: Podiatry;  Laterality: Right;   LEFT HEART CATH AND CORONARY ANGIOGRAPHY N/A 09/24/2020   Procedure: LEFT HEART CATH AND CORONARY ANGIOGRAPHY;  Surgeon: Martinique, Peter M, MD;  Location: [place] [facility];  Service: Cardiovascular;  Laterality: N/A;   LOWER EXTREMITY ANGIOGRAPHY Bilateral 05/17/2019   Procedure: LOWER EXTREMITY ANGIOGRAPHY;  Surgeon: Elam Dutch, MD;  Location: [place] [facility];  Service: Cardiovascular;  Laterality: Bilateral;   REMOVAL OF A DIALYSIS CATHETER Right 01/08/2021   Procedure: REMOVAL OF TEMPORARY DIALYSIS CATHETER, LEFT FEMORAL VEIN;  Surgeon: Angelia Mould, MD;  Location: [facility];  Service: Vascular;  Laterality: Right;   REVISON OF ARTERIOVENOUS FISTULA Right 12/14/2012   Procedure: REVISON OF upper arm ARTERIOVENOUS FISTULA using 8mmx10cm gortex graft;  Surgeon: Conrad [place]port, MD;  Location: [place];  Service: Vascular;  Laterality: Right;   REVISON OF ARTERIOVENOUS FISTULA Right 12/27/2020   Procedure: RIGHT UPPER EXTREMITY ARTERIOVENOUS FISTULA REVISiON;  Surgeon: Cherre Robins, MD;  Location: [facility];  Service: Vascular;  Laterality: Right;  PERIPHERAL NERVE BLOCK   RIGHT/LEFT HEART CATH AND CORONARY ANGIOGRAPHY N/A 08/07/2020   Procedure: RIGHT/LEFT HEART CATH AND CORONARY ANGIOGRAPHY;  Surgeon: Martinique, Peter M, MD;   Location: [place] [facility];  Service: Cardiovascular;  Laterality: N/A;   SOFT TISSUE MASS EXCISION     Right arm, Left leg  for MRSA infection   Allergies  Allergen Reactions   Atorvastatin     Other reaction(s): Muscle Pain   Rosuvastatin     Other reaction(s): Muscle Pain   Amoxicillin Itching    Did it involve swelling of the face/tongue/throat, SOB, or low BP? Unknown Did it involve sudden or severe rash/hives, skin peeling, or any reaction on the inside of your mouth or nose? Unknown Did you need to seek medical attention at a hospital or doctor's office? Unknown When did it last happen?      20 years ago If all above answers are "NO", may proceed with cephalosporin use.    Chlorhexidine Hives    Patient has blistering   Hydromorphone Itching    Patient states he may take with benadryl. Makes feet itch.   Percocet [Oxycodone-Acetaminophen] Itching and Other (See Comments)    Legs only. Itching and burning feeling.   Morphine     Other reaction(s): rash/itching All narcotics per pt   Gabapentin Nausea And Vomiting    Stomach issues  Objective/Physical Exam General: The patient is alert and oriented x3 in no acute distress.  Dermatology:  Ulcer noted to the medial and lateral aspect of the forefoot.  Exposed fifth metatarsal noted to the lateral foot wound.  No significant drainage.  No malodor.  No erythema or edema that would be concerning for acute infection.  Currently the foot seems somewhat stable in terms of acute cellulitis or osteomyelitis.  Please see above noted photos  Vascular: Known history of peripheral vascular disease.  Patient currently being managed by vascular at [facility]  Neurological: Epicritic and protective threshold diminished bilaterally.   Musculoskeletal Exam: PSxHx TMA RT. history of partial ray amputations first and second digit LT.  Radiographic exam LT foot: Osseous erosion with cortical destruction noted to the  lateral portion of the head of the fifth metatarsal.  Prior partial ray amputations of the first and second metatarsals also noted.  Arterial calcifications also noted.  No radiolucencies or gas within the tissues.  MR FOOT LT WO CONTRAST 02/13/2022: IMPRESSION: Lateral forefoot soft tissue ulcer with underlying developing osteomyelitis of the fifth metatarsal neck and base of the fifth digit proximal phalanx. Marrow edema extends along the majority of the fifth metatarsal shaft, though with relatively preserved T1 marrow signal.   Osteomyelitis of the plantar medial aspect of the third metacarpal head and plantar base of the third digit proximal phalanx.   Probable reactive marrow edema within the fourth metatarsal shaft.   Prior first and second toe amputations with probable reactive marrow edema at the resection margins.   No well-defined/drainable fluid collection on noncontrast MRI.  Assessment: 1.  Ulcers with underlying osteomyelitis left foot secondary to diabetes mellitus 2. diabetes mellitus w/ peripheral neuropathy  Plan of Care:  1. Patient was evaluated. 2.  Today we discussed the need for transmetatarsal amputation of the left foot unfortunately the patient has failed conservative treatment with routine wound care and hyperbarics.  I do believe surgical intervention is warranted and appropriate at this time 3.  The patient states that the majority of his doctors are at [place].  He would prefer to have the surgical management of the osteomyelitis performed at [facility].  This is where his vascular surgeons are.  Patient states that he will contact [facility] to set up an appointment with podiatry or vascular for surgical management of the osteomyelitis 4.  In the meantime, if the patient notices any acute change including redness or swelling or systemic signs of infection to go immediately to the emergency department for admission and surgery 5.  Return to clinic as  needed   Edrick Kins, DPM [facility]  Dr. Edrick Kins, DPM    [address], [place] [address]                Office [phone]  Fax [phone]

## 2022-04-01 ENCOUNTER — Encounter: Payer: Medicare Other | Admitting: Internal Medicine

## 2022-04-01 DIAGNOSIS — M869 Osteomyelitis, unspecified: Secondary | ICD-10-CM | POA: Diagnosis not present

## 2022-04-01 DIAGNOSIS — M86672 Other chronic osteomyelitis, left ankle and foot: Secondary | ICD-10-CM | POA: Diagnosis not present

## 2022-04-01 DIAGNOSIS — E1122 Type 2 diabetes mellitus with diabetic chronic kidney disease: Secondary | ICD-10-CM | POA: Diagnosis not present

## 2022-04-01 DIAGNOSIS — N186 End stage renal disease: Secondary | ICD-10-CM | POA: Diagnosis not present

## 2022-04-01 DIAGNOSIS — E1159 Type 2 diabetes mellitus with other circulatory complications: Secondary | ICD-10-CM | POA: Diagnosis not present

## 2022-04-01 DIAGNOSIS — I739 Peripheral vascular disease, unspecified: Secondary | ICD-10-CM | POA: Diagnosis not present

## 2022-04-01 DIAGNOSIS — L97524 Non-pressure chronic ulcer of other part of left foot with necrosis of bone: Secondary | ICD-10-CM | POA: Diagnosis not present

## 2022-04-01 DIAGNOSIS — I132 Hypertensive heart and chronic kidney disease with heart failure and with stage 5 chronic kidney disease, or end stage renal disease: Secondary | ICD-10-CM | POA: Diagnosis not present

## 2022-04-01 DIAGNOSIS — E11621 Type 2 diabetes mellitus with foot ulcer: Secondary | ICD-10-CM | POA: Diagnosis not present

## 2022-04-01 LAB — GLUCOSE, CAPILLARY
Glucose-Capillary: 201 mg/dL — ABNORMAL HIGH (ref 70–99)
Glucose-Capillary: 234 mg/dL — ABNORMAL HIGH (ref 70–99)

## 2022-04-01 NOTE — Progress Notes (Signed)
[name], [name] ([id number]) Visit Report for 04/01/2022 Arrival Information Details Patient Name: [name], [name]. Date of Service: 04/01/2022 8:00 AM Medical Record Number: [id number] Patient Account Number: [id number] Date of Birth/Sex: Mar 28, 1966 (56 y.o. M) Treating RN: [name] Primary Care [name]: [name] Other Clinician: Jacqulyn [name] Referring [name]: [name] Treating Mical Brun/Extender: [name] in Treatment: 21 Visit Information History Since Last Visit Added or deleted any medications: No Patient Arrived: Ambulatory Any new allergies or adverse reactions: No Arrival Time: 08:32 Had a fall or experienced change in No Accompanied By: self activities of daily living that may affect Transfer Assistance: None risk of falls: Patient Identification Verified: Yes Signs or symptoms of abuse/neglect since last visito No Secondary Verification Process Completed: Yes Hospitalized since last visit: No Patient Requires Transmission-Based No Implantable device outside of the clinic excluding No Precautions: cellular tissue based products placed in the center Patient Has Alerts: Yes since last visit: Patient Alerts: Patient on Blood Pain Present Now: No Thinner Electronic Signature(s) Signed: 04/01/2022 11:47:49 AM By: [name] RCP, RRT, CHT Entered By: Stark Jock, [name] on 04/01/2022 08:40:31 Pinard, [place]. ([id number]) -------------------------------------------------------------------------------- Encounter Discharge Information Details Patient Name: [name], [name] Date of Service: 04/01/2022 8:00 AM Medical Record Number: [id number] Patient Account Number: [id number] Date of Birth/Sex: 07-03-1966 (56 y.o. M) Treating RN: [name] Primary Care Azzie Thiem: [name] Other Clinician: Jacqulyn [name] Referring Philisha Weinel: [name] Treating [name]: [name] in Treatment: 21 Encounter  Discharge Information Items Discharge Condition: Stable Ambulatory Status: Ambulatory Discharge Destination: Home Transportation: Private Auto Accompanied By: self Schedule Follow-up Appointment: Yes Clinical Summary of Care: Notes Patient has an HBO treatment scheduled on 04/02/22 at 11:00 am. Electronic Signature(s) Signed: 04/01/2022 11:47:49 AM By: [name] RCP, RRT, CHT Entered By: [name] on 04/01/2022 10:45:16 [name], [name] ([id number]) -------------------------------------------------------------------------------- Vitals Details Patient Name: [name], [name] Date of Service: 04/01/2022 8:00 AM Medical Record Number: [id number] Patient Account Number: [id number] Date of Birth/Sex: September 14, 1966 (56 y.o. M) Treating RN: [name] Primary Care Ezabella Teska: [name] Other Clinician: Jacqulyn [name] Referring Jamaurion Slemmer: [name] Treating [name]/Extender: [name] in Treatment: 21 Vital Signs Time Taken: 08:00 Temperature (F): 98.1 Height (in): 70 Pulse (bpm): 72 Weight (lbs): 180.4 Respiratory Rate (breaths/min): 16 Body Mass Index (BMI): 25.9 Blood Pressure (mmHg): 128/72 Capillary Blood Glucose (mg/dl): 201 Reference Range: 80 - 120 mg / dl Electronic Signature(s) Signed: 04/01/2022 11:47:49 AM By: [name] RCP, RRT, CHT Entered By: [name] on 04/01/2022 08:47:19

## 2022-04-02 ENCOUNTER — Encounter (HOSPITAL_BASED_OUTPATIENT_CLINIC_OR_DEPARTMENT_OTHER): Payer: Medicare Other | Admitting: Internal Medicine

## 2022-04-02 DIAGNOSIS — E11621 Type 2 diabetes mellitus with foot ulcer: Secondary | ICD-10-CM | POA: Diagnosis not present

## 2022-04-02 DIAGNOSIS — E1122 Type 2 diabetes mellitus with diabetic chronic kidney disease: Secondary | ICD-10-CM | POA: Diagnosis not present

## 2022-04-02 DIAGNOSIS — N186 End stage renal disease: Secondary | ICD-10-CM | POA: Diagnosis not present

## 2022-04-02 DIAGNOSIS — L97524 Non-pressure chronic ulcer of other part of left foot with necrosis of bone: Secondary | ICD-10-CM | POA: Diagnosis not present

## 2022-04-02 DIAGNOSIS — I96 Gangrene, not elsewhere classified: Secondary | ICD-10-CM | POA: Diagnosis not present

## 2022-04-02 DIAGNOSIS — M86672 Other chronic osteomyelitis, left ankle and foot: Secondary | ICD-10-CM

## 2022-04-02 DIAGNOSIS — Z992 Dependence on renal dialysis: Secondary | ICD-10-CM | POA: Diagnosis not present

## 2022-04-02 DIAGNOSIS — N2581 Secondary hyperparathyroidism of renal origin: Secondary | ICD-10-CM | POA: Diagnosis not present

## 2022-04-02 DIAGNOSIS — D631 Anemia in chronic kidney disease: Secondary | ICD-10-CM | POA: Diagnosis not present

## 2022-04-02 DIAGNOSIS — I132 Hypertensive heart and chronic kidney disease with heart failure and with stage 5 chronic kidney disease, or end stage renal disease: Secondary | ICD-10-CM | POA: Diagnosis not present

## 2022-04-02 LAB — GLUCOSE, CAPILLARY
Glucose-Capillary: 140 mg/dL — ABNORMAL HIGH (ref 70–99)
Glucose-Capillary: 152 mg/dL — ABNORMAL HIGH (ref 70–99)

## 2022-04-02 NOTE — Progress Notes (Signed)
Mcnease, [place] ([id number]) Visit Report for 04/02/2022 HBO Details Patient Name: [name], [name] Date of Service: 04/02/2022 11:00 AM Medical Record Number: [id number] Patient Account Number: [id number] Date of Birth/Sex: 07-27-1966 (56 y.o. 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 HBO Treatment Details Treatment Number: 98 Patient Type: Outpatient Chamber Type: Monoplace Chamber Serial #: [id number] Treatment Protocol: 2.0 ATA with 90 minutes oxygen, and no air breaks Treatment Details Compression Rate Down: 1.0 psi / minute De-Compression Rate Up: 1.5 psi / minute Compress Tx Pressure Air breaks and breathing periods Decompress Decompress Begins Reached (leave unused spaces blank) Begins Ends Chamber Pressure (ATA) 1 2 - - - - - - 2 1 Clock Time (24 hr) 10:22 10:39 - - - - - - 12:10 12:20 Treatment Length: 118 (minutes) Treatment Segments: 4 Vital Signs Capillary Blood Glucose Reference Range: 80 - 120 mg / dl HBO Diabetic Blood Glucose Intervention Range: <131 mg/dl or >249 mg/dl Time Vitals Blood Respiratory Capillary Blood Glucose Pulse Action Type: Pulse: Temperature: Taken: Pressure: Rate: Glucose (mg/dl): Meter #: Oximetry (%) Taken: Pre 10:13 132/78 72 16 98.1 140 1 none per protocol Post 01:52 126/72 72 16 97.9 1233 1 none per protocol Treatment Response Treatment Toleration: Well Treatment Completion Treatment Completed without Adverse Event Status: HBO Attestation I certify that I supervised this HBO treatment in accordance with Medicare guidelines. A trained emergency response team is  readily Yes available per hospital policies and procedures. Continue HBOT as ordered. Yes Electronic Signature(s) Signed: 04/02/2022 2:06:42 PM By: Zackery Barefoot, RRT, CHT Signed: 04/02/2022 3:00:37 PM By: Kalman Shan DO Previous Signature: 04/02/2022 1:49:53 PM Version By: Kalman Shan DO Previous Signature: 04/02/2022 12:50:34 PM Version By: Enedina Finner RCP, RRT, CHT Entered By: Enedina Finner on 04/02/2022 14:04:39 Totman, Saul L. ([id number]) -------------------------------------------------------------------------------- HBO Safety Checklist Details Patient Name: [name], [name] Date of Service: 04/02/2022 11:00 AM Medical Record Number: [id number] Patient Account Number: [id number] Date of Birth/Sex: Mar 14, 1966 (56 y.o. M) Treating RN: [name] Primary Care Marcea Rojek: [name] Other Clinician: Jacqulyn Bath Referring Nancyjo Givhan: [name] Treating Jack Mineau/Extender: Yaakov Guthrie in Treatment: 21 HBO Safety Checklist Items Safety Checklist Consent Form Signed Patient voided / foley secured and emptied When did you last eato 09:30 am Last dose of injectable or oral agent n/a Ostomy pouch emptied and vented if applicable NA All implantable devices assessed, documented and approved NA Intravenous access site secured and place NA Valuables secured Linens and cotton and cotton/polyester blend (less than 51% polyester) Personal oil-based products / skin lotions / body lotions removed Wigs or hairpieces removed NA Smoking or tobacco materials removed NA Books / newspapers / magazines / loose paper removed NA Cologne, aftershave, perfume and deodorant removed Jewelry removed (may wrap wedding band) Make-up removed NA Hair care products removed Battery operated devices (external) removed NA Heating patches and chemical warmers removed NA Titanium eyewear removed NA Nail polish cured greater than 10  hours NA Casting material cured greater than 10 hours NA Hearing aids removed NA Loose dentures or partials removed NA Prosthetics have been removed NA Patient demonstrates correct use of air break device (if applicable) Patient concerns have been addressed Patient grounding bracelet on and cord  attached to chamber Specifics for Inpatients (complete in addition to above) Medication sheet sent with patient Intravenous medications needed or due during therapy sent with patient Drainage tubes (e.g. nasogastric tube or chest tube secured and vented) Endotracheal or Tracheotomy tube secured Cuff deflated of air and inflated with saline Airway suctioned Electronic Signature(s) Signed: 04/02/2022 12:50:34 PM By: Enedina Finner RCP, RRT, CHT Entered By: Enedina Finner on 04/02/2022 11:10:52

## 2022-04-02 NOTE — Progress Notes (Signed)
Pollman, [place] ([id number]) Visit Report for 04/02/2022 Arrival Information Details Patient Name: [name], [name]. Date of Service: 04/02/2022 11:00 AM Medical Record Number: [id number] Patient Account Number: [id number] Date of Birth/Sex: Apr 02, 1966 (56 y.o. M) Treating RN: [name] Primary Care Lalania Haseman: [name] Other Clinician: Jacqulyn Bath Referring Shomari Matusik: [name] Treating Tinya Cadogan/Extender: Yaakov Guthrie in Treatment: 21 Visit Information History Since Last Visit Added or deleted any medications: No Patient Arrived: Ambulatory Any new allergies or adverse reactions: No Arrival Time: 10:05 Had a fall or experienced change in No Accompanied By: self activities of daily living that may affect Transfer Assistance: None risk of falls: Patient Identification Verified: Yes Signs or symptoms of abuse/neglect since last visito No Secondary Verification Process Completed: Yes Hospitalized since last visit: No Patient Requires Transmission-Based No Implantable device outside of the clinic excluding No Precautions: cellular tissue based products placed in the center Patient Has Alerts: Yes since last visit: Patient Alerts: Patient on Blood Pain Present Now: No Thinner Electronic Signature(s) Signed: 04/02/2022 12:50:34 PM By: Enedina Finner RCP, RRT, CHT Entered By: Stark Jock, Amado Nash on 04/02/2022 11:08:27 [name], [name] ([id number]) -------------------------------------------------------------------------------- Encounter Discharge Information Details Patient Name: [name], [name] Date of Service: 04/02/2022 11:00 AM Medical Record Number: [id number] Patient Account Number: [id number] Date of Birth/Sex: November 09, 1965 (56 y.o. M) Treating RN: [name] Primary Care Jarman Litton: [name] Other Clinician: Jacqulyn Bath Referring Jari Dipasquale: [name] Treating Umberto Pavek/Extender: Yaakov Guthrie in Treatment: 21 Encounter  Discharge Information Items Discharge Condition: Stable Ambulatory Status: Ambulatory Discharge Destination: Home Transportation: Private Auto Accompanied By: self Schedule Follow-up Appointment: Yes Clinical Summary of Care: Electronic Signature(s) Signed: 04/02/2022 12:50:34 PM By: Enedina Finner RCP, RRT, CHT Entered By: Stark Jock, Amado Nash on 04/02/2022 12:50:13 [name], [name] ([id number]) -------------------------------------------------------------------------------- Vitals Details Patient Name: [name], [name] Date of Service: 04/02/2022 11:00 AM Medical Record Number: [id number] Patient Account Number: [id number] Date of Birth/Sex: 1965/12/09 (57 y.o. M) Treating RN: [name] Primary Care Wanita Derenzo: [name] Other Clinician: Jacqulyn Bath Referring Shaneese Tait: [name] Treating Sona Nations/Extender: Yaakov Guthrie in Treatment: 21 Vital Signs Time Taken: 10:13 Temperature (F): 98.1 Height (in): 70 Pulse (bpm): 72 Weight (lbs): 180.4 Respiratory Rate (breaths/min): 16 Body Mass Index (BMI): 25.9 Blood Pressure (mmHg): 132/78 Capillary Blood Glucose (mg/dl): 140 Reference Range: 80 - 120 mg / dl Electronic Signature(s) Signed: 04/02/2022 12:50:34 PM By: Enedina Finner RCP, RRT, CHT Entered By: Stark Jock, Amado Nash on 04/02/2022 11:09:49

## 2022-04-02 NOTE — Progress Notes (Signed)
Zepeda, [place] ([id number]) Visit Report for 04/01/2022 HBO Details Patient Name: [name], [name] Date of Service: 04/01/2022 8:00 AM Medical Record Number: [id number] Patient Account Number: [id number] Date of Birth/Sex: 01-06-1966 (56 y.o. 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 HBO Treatment Details Treatment Number: 97 Patient Type: Outpatient Chamber Type: Monoplace Chamber Serial #: [id number] Treatment Protocol: 2.0 ATA with 90 minutes oxygen, and no air breaks Treatment Details Compression Rate Down: 1.5 psi / minute De-Compression Rate Up: 1.5 psi / minute Compress Tx Pressure Air breaks and breathing periods Decompress Decompress Begins Reached (leave unused spaces blank) Begins Ends Chamber Pressure (ATA) 1 2 - - - - - - 2 1 Clock Time (24 hr) 08:19 08:29 - - - - - - 10:00 10:10 Treatment Length: 111 (minutes) Treatment Segments: 4 Vital Signs Capillary Blood Glucose Reference Range: 80 - 120 mg / dl HBO Diabetic Blood Glucose Intervention Range: <131 mg/dl or >249 mg/dl Time Vitals Blood Respiratory Capillary Blood Glucose Pulse Action Type: Pulse: Temperature: Taken: Pressure: Rate: Glucose (mg/dl): Meter #: Oximetry (%) Taken: Pre 08:00 128/72 72 16 98.1 201 1 none per protocol Post 10:15 130/72 78 16 98 234 1 none per protocol Treatment Response Treatment Toleration: Well Treatment Completion Treatment Completed without Adverse Event Status: [name] Notes No concerns with treatment given HBO Attestation I certify that I supervised this HBO treatment in accordance with Medicare  guidelines. A trained emergency response team is readily Yes available per hospital policies and procedures. Continue HBOT as ordered. Yes Electronic Signature(s) Signed: 04/02/2022 11:05:19 AM By: Linton Ham MD Previous Signature: 04/01/2022 11:47:49 AM Version By: Enedina Finner RCP, RRT, CHT Entered By: Linton Ham on 04/01/2022 16:18:43 [name], [name] -------------------------------------------------------------------------------- HBO Safety Checklist Details Patient Name: [name], [name] Date of Service: 04/01/2022 8:00 AM Medical Record Number: [id number] Patient Account Number: [id number] Date of Birth/Sex: 1966/09/15 (57 y.o. M) Treating RN: [name] Primary Care Cosme Jacob: [name] Other Clinician: Jacqulyn Bath Referring Zully Frane: [name] Treating Bharath Bernstein/Extender: Tito Dine in Treatment: 21 HBO Safety Checklist Items Safety Checklist Consent Form Signed Patient voided / foley secured and emptied When did you last eato 07:00 am Last dose of injectable or oral agent n/a Ostomy pouch emptied and vented if applicable NA All implantable devices assessed, documented and approved NA Intravenous access site secured and place NA Valuables secured Linens and cotton and cotton/polyester blend (less than 51% polyester) Personal oil-based products / skin lotions / body lotions removed Wigs or hairpieces removed NA Smoking or tobacco materials removed NA Books / newspapers / magazines / loose paper removed Cologne, aftershave, perfume and deodorant removed Jewelry removed (may wrap wedding band) Make-up removed NA Hair care products removed Battery operated devices (external) removed NA Heating patches and chemical warmers removed NA Titanium eyewear removed NA Nail polish cured greater than 10 hours NA Casting material cured greater than 10 hours NA Hearing aids removed NA Loose dentures or partials  removed NA Prosthetics have been removed NA Patient demonstrates correct use of air break device (if applicable) Patient concerns have been addressed Patient grounding bracelet on and cord attached to chamber Specifics for Inpatients (complete in addition to above) Medication sheet sent  with patient Intravenous medications needed or due during therapy sent with patient Drainage tubes (e.g. nasogastric tube or chest tube secured and vented) Endotracheal or Tracheotomy tube secured Cuff deflated of air and inflated with saline Airway suctioned Electronic Signature(s) Signed: 04/01/2022 11:47:49 AM By: Enedina Finner RCP, RRT, CHT Entered By: Enedina Finner on 04/01/2022 08:50:35

## 2022-04-03 ENCOUNTER — Encounter: Payer: Medicare Other | Admitting: Physician Assistant

## 2022-04-03 DIAGNOSIS — Z89422 Acquired absence of other left toe(s): Secondary | ICD-10-CM | POA: Diagnosis not present

## 2022-04-03 DIAGNOSIS — I739 Peripheral vascular disease, unspecified: Secondary | ICD-10-CM | POA: Diagnosis not present

## 2022-04-03 DIAGNOSIS — Z89431 Acquired absence of right foot: Secondary | ICD-10-CM | POA: Diagnosis not present

## 2022-04-03 DIAGNOSIS — E1151 Type 2 diabetes mellitus with diabetic peripheral angiopathy without gangrene: Secondary | ICD-10-CM | POA: Diagnosis not present

## 2022-04-03 DIAGNOSIS — Z7902 Long term (current) use of antithrombotics/antiplatelets: Secondary | ICD-10-CM | POA: Diagnosis not present

## 2022-04-03 DIAGNOSIS — Z4781 Encounter for orthopedic aftercare following surgical amputation: Secondary | ICD-10-CM | POA: Diagnosis not present

## 2022-04-03 DIAGNOSIS — E1122 Type 2 diabetes mellitus with diabetic chronic kidney disease: Secondary | ICD-10-CM | POA: Diagnosis not present

## 2022-04-03 DIAGNOSIS — Z992 Dependence on renal dialysis: Secondary | ICD-10-CM | POA: Diagnosis not present

## 2022-04-03 DIAGNOSIS — E1142 Type 2 diabetes mellitus with diabetic polyneuropathy: Secondary | ICD-10-CM | POA: Diagnosis not present

## 2022-04-03 DIAGNOSIS — Z9181 History of falling: Secondary | ICD-10-CM | POA: Diagnosis not present

## 2022-04-03 DIAGNOSIS — I2510 Atherosclerotic heart disease of native coronary artery without angina pectoris: Secondary | ICD-10-CM | POA: Diagnosis not present

## 2022-04-03 DIAGNOSIS — Z955 Presence of coronary angioplasty implant and graft: Secondary | ICD-10-CM | POA: Diagnosis not present

## 2022-04-03 DIAGNOSIS — D631 Anemia in chronic kidney disease: Secondary | ICD-10-CM | POA: Diagnosis not present

## 2022-04-03 DIAGNOSIS — E782 Mixed hyperlipidemia: Secondary | ICD-10-CM | POA: Diagnosis not present

## 2022-04-03 DIAGNOSIS — Z7982 Long term (current) use of aspirin: Secondary | ICD-10-CM | POA: Diagnosis not present

## 2022-04-03 DIAGNOSIS — Z89412 Acquired absence of left great toe: Secondary | ICD-10-CM | POA: Diagnosis not present

## 2022-04-03 DIAGNOSIS — I120 Hypertensive chronic kidney disease with stage 5 chronic kidney disease or end stage renal disease: Secondary | ICD-10-CM | POA: Diagnosis not present

## 2022-04-03 DIAGNOSIS — E039 Hypothyroidism, unspecified: Secondary | ICD-10-CM | POA: Diagnosis not present

## 2022-04-03 DIAGNOSIS — N186 End stage renal disease: Secondary | ICD-10-CM | POA: Diagnosis not present

## 2022-04-04 ENCOUNTER — Ambulatory Visit: Payer: Medicare Other | Admitting: Physician Assistant

## 2022-04-04 DIAGNOSIS — Z992 Dependence on renal dialysis: Secondary | ICD-10-CM | POA: Diagnosis not present

## 2022-04-04 DIAGNOSIS — N2581 Secondary hyperparathyroidism of renal origin: Secondary | ICD-10-CM | POA: Diagnosis not present

## 2022-04-04 DIAGNOSIS — D631 Anemia in chronic kidney disease: Secondary | ICD-10-CM | POA: Diagnosis not present

## 2022-04-04 DIAGNOSIS — N186 End stage renal disease: Secondary | ICD-10-CM | POA: Diagnosis not present

## 2022-04-04 DIAGNOSIS — I96 Gangrene, not elsewhere classified: Secondary | ICD-10-CM | POA: Diagnosis not present

## 2022-04-07 DIAGNOSIS — N186 End stage renal disease: Secondary | ICD-10-CM | POA: Diagnosis not present

## 2022-04-07 DIAGNOSIS — N2581 Secondary hyperparathyroidism of renal origin: Secondary | ICD-10-CM | POA: Diagnosis not present

## 2022-04-07 DIAGNOSIS — Z992 Dependence on renal dialysis: Secondary | ICD-10-CM | POA: Diagnosis not present

## 2022-04-09 DIAGNOSIS — N2581 Secondary hyperparathyroidism of renal origin: Secondary | ICD-10-CM | POA: Diagnosis not present

## 2022-04-09 DIAGNOSIS — Z992 Dependence on renal dialysis: Secondary | ICD-10-CM | POA: Diagnosis not present

## 2022-04-09 DIAGNOSIS — N186 End stage renal disease: Secondary | ICD-10-CM | POA: Diagnosis not present

## 2022-04-10 DIAGNOSIS — H1033 Unspecified acute conjunctivitis, bilateral: Secondary | ICD-10-CM | POA: Diagnosis not present

## 2022-04-10 DIAGNOSIS — I10 Essential (primary) hypertension: Secondary | ICD-10-CM | POA: Diagnosis not present

## 2022-04-11 ENCOUNTER — Ambulatory Visit: Payer: Medicare Other | Admitting: Physician Assistant

## 2022-04-12 DIAGNOSIS — I96 Gangrene, not elsewhere classified: Secondary | ICD-10-CM | POA: Diagnosis not present

## 2022-04-12 DIAGNOSIS — Z992 Dependence on renal dialysis: Secondary | ICD-10-CM | POA: Diagnosis not present

## 2022-04-12 DIAGNOSIS — N2581 Secondary hyperparathyroidism of renal origin: Secondary | ICD-10-CM | POA: Diagnosis not present

## 2022-04-12 DIAGNOSIS — D631 Anemia in chronic kidney disease: Secondary | ICD-10-CM | POA: Diagnosis not present

## 2022-04-12 DIAGNOSIS — N186 End stage renal disease: Secondary | ICD-10-CM | POA: Diagnosis not present

## 2022-04-14 DIAGNOSIS — D631 Anemia in chronic kidney disease: Secondary | ICD-10-CM | POA: Diagnosis not present

## 2022-04-14 DIAGNOSIS — L02612 Cutaneous abscess of left foot: Secondary | ICD-10-CM | POA: Diagnosis not present

## 2022-04-14 DIAGNOSIS — Z5181 Encounter for therapeutic drug level monitoring: Secondary | ICD-10-CM | POA: Diagnosis not present

## 2022-04-14 DIAGNOSIS — N186 End stage renal disease: Secondary | ICD-10-CM | POA: Diagnosis not present

## 2022-04-14 DIAGNOSIS — I96 Gangrene, not elsewhere classified: Secondary | ICD-10-CM | POA: Diagnosis not present

## 2022-04-14 DIAGNOSIS — N2581 Secondary hyperparathyroidism of renal origin: Secondary | ICD-10-CM | POA: Diagnosis not present

## 2022-04-14 DIAGNOSIS — Z992 Dependence on renal dialysis: Secondary | ICD-10-CM | POA: Diagnosis not present

## 2022-04-15 DIAGNOSIS — D631 Anemia in chronic kidney disease: Secondary | ICD-10-CM | POA: Diagnosis not present

## 2022-04-15 DIAGNOSIS — E1122 Type 2 diabetes mellitus with diabetic chronic kidney disease: Secondary | ICD-10-CM | POA: Diagnosis not present

## 2022-04-15 DIAGNOSIS — I120 Hypertensive chronic kidney disease with stage 5 chronic kidney disease or end stage renal disease: Secondary | ICD-10-CM | POA: Diagnosis not present

## 2022-04-15 DIAGNOSIS — Z4781 Encounter for orthopedic aftercare following surgical amputation: Secondary | ICD-10-CM | POA: Diagnosis not present

## 2022-04-15 DIAGNOSIS — E1151 Type 2 diabetes mellitus with diabetic peripheral angiopathy without gangrene: Secondary | ICD-10-CM | POA: Diagnosis not present

## 2022-04-15 DIAGNOSIS — N186 End stage renal disease: Secondary | ICD-10-CM | POA: Diagnosis not present

## 2022-04-16 DIAGNOSIS — N186 End stage renal disease: Secondary | ICD-10-CM | POA: Diagnosis not present

## 2022-04-16 DIAGNOSIS — N2581 Secondary hyperparathyroidism of renal origin: Secondary | ICD-10-CM | POA: Diagnosis not present

## 2022-04-16 DIAGNOSIS — I96 Gangrene, not elsewhere classified: Secondary | ICD-10-CM | POA: Diagnosis not present

## 2022-04-16 DIAGNOSIS — D631 Anemia in chronic kidney disease: Secondary | ICD-10-CM | POA: Diagnosis not present

## 2022-04-16 DIAGNOSIS — Z992 Dependence on renal dialysis: Secondary | ICD-10-CM | POA: Diagnosis not present

## 2022-04-17 DIAGNOSIS — L97521 Non-pressure chronic ulcer of other part of left foot limited to breakdown of skin: Secondary | ICD-10-CM | POA: Diagnosis not present

## 2022-04-17 DIAGNOSIS — E11621 Type 2 diabetes mellitus with foot ulcer: Secondary | ICD-10-CM | POA: Diagnosis not present

## 2022-04-17 DIAGNOSIS — N186 End stage renal disease: Secondary | ICD-10-CM | POA: Diagnosis not present

## 2022-04-17 DIAGNOSIS — I739 Peripheral vascular disease, unspecified: Secondary | ICD-10-CM | POA: Diagnosis not present

## 2022-04-18 ENCOUNTER — Ambulatory Visit: Payer: Medicare Other | Admitting: Physician Assistant

## 2022-04-18 DIAGNOSIS — N2581 Secondary hyperparathyroidism of renal origin: Secondary | ICD-10-CM | POA: Diagnosis not present

## 2022-04-18 DIAGNOSIS — Z992 Dependence on renal dialysis: Secondary | ICD-10-CM | POA: Diagnosis not present

## 2022-04-18 DIAGNOSIS — I96 Gangrene, not elsewhere classified: Secondary | ICD-10-CM | POA: Diagnosis not present

## 2022-04-18 DIAGNOSIS — N186 End stage renal disease: Secondary | ICD-10-CM | POA: Diagnosis not present

## 2022-04-18 DIAGNOSIS — D631 Anemia in chronic kidney disease: Secondary | ICD-10-CM | POA: Diagnosis not present

## 2022-04-19 DIAGNOSIS — E1129 Type 2 diabetes mellitus with other diabetic kidney complication: Secondary | ICD-10-CM | POA: Diagnosis not present

## 2022-04-19 DIAGNOSIS — Z992 Dependence on renal dialysis: Secondary | ICD-10-CM | POA: Diagnosis not present

## 2022-04-19 DIAGNOSIS — N186 End stage renal disease: Secondary | ICD-10-CM | POA: Diagnosis not present

## 2022-04-21 DIAGNOSIS — Z5181 Encounter for therapeutic drug level monitoring: Secondary | ICD-10-CM | POA: Diagnosis not present

## 2022-04-21 DIAGNOSIS — N186 End stage renal disease: Secondary | ICD-10-CM | POA: Diagnosis not present

## 2022-04-21 DIAGNOSIS — Z992 Dependence on renal dialysis: Secondary | ICD-10-CM | POA: Diagnosis not present

## 2022-04-21 DIAGNOSIS — I96 Gangrene, not elsewhere classified: Secondary | ICD-10-CM | POA: Diagnosis not present

## 2022-04-21 DIAGNOSIS — M868X8 Other osteomyelitis, other site: Secondary | ICD-10-CM | POA: Diagnosis not present

## 2022-04-21 DIAGNOSIS — N2581 Secondary hyperparathyroidism of renal origin: Secondary | ICD-10-CM | POA: Diagnosis not present

## 2022-04-21 DIAGNOSIS — L02612 Cutaneous abscess of left foot: Secondary | ICD-10-CM | POA: Diagnosis not present

## 2022-04-21 DIAGNOSIS — D631 Anemia in chronic kidney disease: Secondary | ICD-10-CM | POA: Diagnosis not present

## 2022-04-23 DIAGNOSIS — M868X8 Other osteomyelitis, other site: Secondary | ICD-10-CM | POA: Diagnosis not present

## 2022-04-23 DIAGNOSIS — E1151 Type 2 diabetes mellitus with diabetic peripheral angiopathy without gangrene: Secondary | ICD-10-CM | POA: Diagnosis not present

## 2022-04-23 DIAGNOSIS — N186 End stage renal disease: Secondary | ICD-10-CM | POA: Diagnosis not present

## 2022-04-23 DIAGNOSIS — L02612 Cutaneous abscess of left foot: Secondary | ICD-10-CM | POA: Diagnosis not present

## 2022-04-23 DIAGNOSIS — I120 Hypertensive chronic kidney disease with stage 5 chronic kidney disease or end stage renal disease: Secondary | ICD-10-CM | POA: Diagnosis not present

## 2022-04-23 DIAGNOSIS — Z992 Dependence on renal dialysis: Secondary | ICD-10-CM | POA: Diagnosis not present

## 2022-04-23 DIAGNOSIS — D631 Anemia in chronic kidney disease: Secondary | ICD-10-CM | POA: Diagnosis not present

## 2022-04-23 DIAGNOSIS — Z4781 Encounter for orthopedic aftercare following surgical amputation: Secondary | ICD-10-CM | POA: Diagnosis not present

## 2022-04-23 DIAGNOSIS — E1122 Type 2 diabetes mellitus with diabetic chronic kidney disease: Secondary | ICD-10-CM | POA: Diagnosis not present

## 2022-04-23 DIAGNOSIS — N2581 Secondary hyperparathyroidism of renal origin: Secondary | ICD-10-CM | POA: Diagnosis not present

## 2022-04-25 DIAGNOSIS — L02612 Cutaneous abscess of left foot: Secondary | ICD-10-CM | POA: Diagnosis not present

## 2022-04-25 DIAGNOSIS — D631 Anemia in chronic kidney disease: Secondary | ICD-10-CM | POA: Diagnosis not present

## 2022-04-25 DIAGNOSIS — N2581 Secondary hyperparathyroidism of renal origin: Secondary | ICD-10-CM | POA: Diagnosis not present

## 2022-04-25 DIAGNOSIS — Z992 Dependence on renal dialysis: Secondary | ICD-10-CM | POA: Diagnosis not present

## 2022-04-25 DIAGNOSIS — N186 End stage renal disease: Secondary | ICD-10-CM | POA: Diagnosis not present

## 2022-04-25 DIAGNOSIS — M868X8 Other osteomyelitis, other site: Secondary | ICD-10-CM | POA: Diagnosis not present

## 2022-04-28 DIAGNOSIS — Z5181 Encounter for therapeutic drug level monitoring: Secondary | ICD-10-CM | POA: Diagnosis not present

## 2022-04-28 DIAGNOSIS — N186 End stage renal disease: Secondary | ICD-10-CM | POA: Diagnosis not present

## 2022-04-28 DIAGNOSIS — L02612 Cutaneous abscess of left foot: Secondary | ICD-10-CM | POA: Diagnosis not present

## 2022-04-28 DIAGNOSIS — D631 Anemia in chronic kidney disease: Secondary | ICD-10-CM | POA: Diagnosis not present

## 2022-04-28 DIAGNOSIS — Z992 Dependence on renal dialysis: Secondary | ICD-10-CM | POA: Diagnosis not present

## 2022-04-28 DIAGNOSIS — M868X8 Other osteomyelitis, other site: Secondary | ICD-10-CM | POA: Diagnosis not present

## 2022-04-28 DIAGNOSIS — E1129 Type 2 diabetes mellitus with other diabetic kidney complication: Secondary | ICD-10-CM | POA: Diagnosis not present

## 2022-04-28 DIAGNOSIS — N2581 Secondary hyperparathyroidism of renal origin: Secondary | ICD-10-CM | POA: Diagnosis not present

## 2022-04-28 DIAGNOSIS — I96 Gangrene, not elsewhere classified: Secondary | ICD-10-CM | POA: Diagnosis not present

## 2022-04-29 DIAGNOSIS — I120 Hypertensive chronic kidney disease with stage 5 chronic kidney disease or end stage renal disease: Secondary | ICD-10-CM | POA: Diagnosis not present

## 2022-04-29 DIAGNOSIS — N186 End stage renal disease: Secondary | ICD-10-CM | POA: Diagnosis not present

## 2022-04-29 DIAGNOSIS — E1122 Type 2 diabetes mellitus with diabetic chronic kidney disease: Secondary | ICD-10-CM | POA: Diagnosis not present

## 2022-04-29 DIAGNOSIS — D631 Anemia in chronic kidney disease: Secondary | ICD-10-CM | POA: Diagnosis not present

## 2022-04-29 DIAGNOSIS — E1151 Type 2 diabetes mellitus with diabetic peripheral angiopathy without gangrene: Secondary | ICD-10-CM | POA: Diagnosis not present

## 2022-04-29 DIAGNOSIS — Z4781 Encounter for orthopedic aftercare following surgical amputation: Secondary | ICD-10-CM | POA: Diagnosis not present

## 2022-04-30 DIAGNOSIS — N186 End stage renal disease: Secondary | ICD-10-CM | POA: Diagnosis not present

## 2022-04-30 DIAGNOSIS — N2581 Secondary hyperparathyroidism of renal origin: Secondary | ICD-10-CM | POA: Diagnosis not present

## 2022-04-30 DIAGNOSIS — M868X8 Other osteomyelitis, other site: Secondary | ICD-10-CM | POA: Diagnosis not present

## 2022-04-30 DIAGNOSIS — D631 Anemia in chronic kidney disease: Secondary | ICD-10-CM | POA: Diagnosis not present

## 2022-04-30 DIAGNOSIS — L02612 Cutaneous abscess of left foot: Secondary | ICD-10-CM | POA: Diagnosis not present

## 2022-04-30 DIAGNOSIS — Z992 Dependence on renal dialysis: Secondary | ICD-10-CM | POA: Diagnosis not present

## 2022-05-01 DIAGNOSIS — I2510 Atherosclerotic heart disease of native coronary artery without angina pectoris: Secondary | ICD-10-CM | POA: Diagnosis not present

## 2022-05-01 DIAGNOSIS — R634 Abnormal weight loss: Secondary | ICD-10-CM | POA: Diagnosis not present

## 2022-05-01 DIAGNOSIS — N186 End stage renal disease: Secondary | ICD-10-CM | POA: Diagnosis not present

## 2022-05-01 DIAGNOSIS — E11621 Type 2 diabetes mellitus with foot ulcer: Secondary | ICD-10-CM | POA: Diagnosis not present

## 2022-05-01 DIAGNOSIS — I132 Hypertensive heart and chronic kidney disease with heart failure and with stage 5 chronic kidney disease, or end stage renal disease: Secondary | ICD-10-CM | POA: Diagnosis not present

## 2022-05-01 DIAGNOSIS — Z992 Dependence on renal dialysis: Secondary | ICD-10-CM | POA: Diagnosis not present

## 2022-05-01 DIAGNOSIS — E7800 Pure hypercholesterolemia, unspecified: Secondary | ICD-10-CM | POA: Diagnosis not present

## 2022-05-01 DIAGNOSIS — E039 Hypothyroidism, unspecified: Secondary | ICD-10-CM | POA: Diagnosis not present

## 2022-05-01 DIAGNOSIS — N184 Chronic kidney disease, stage 4 (severe): Secondary | ICD-10-CM | POA: Diagnosis not present

## 2022-05-01 DIAGNOSIS — I5020 Unspecified systolic (congestive) heart failure: Secondary | ICD-10-CM | POA: Diagnosis not present

## 2022-05-01 DIAGNOSIS — L97509 Non-pressure chronic ulcer of other part of unspecified foot with unspecified severity: Secondary | ICD-10-CM | POA: Diagnosis not present

## 2022-05-01 DIAGNOSIS — E213 Hyperparathyroidism, unspecified: Secondary | ICD-10-CM | POA: Diagnosis not present

## 2022-05-01 DIAGNOSIS — I739 Peripheral vascular disease, unspecified: Secondary | ICD-10-CM | POA: Diagnosis not present

## 2022-05-01 DIAGNOSIS — M868X7 Other osteomyelitis, ankle and foot: Secondary | ICD-10-CM | POA: Diagnosis not present

## 2022-05-01 DIAGNOSIS — I890 Lymphedema, not elsewhere classified: Secondary | ICD-10-CM | POA: Diagnosis not present

## 2022-05-01 DIAGNOSIS — I10 Essential (primary) hypertension: Secondary | ICD-10-CM | POA: Diagnosis not present

## 2022-05-01 DIAGNOSIS — I272 Pulmonary hypertension, unspecified: Secondary | ICD-10-CM | POA: Diagnosis not present

## 2022-05-01 DIAGNOSIS — Z01818 Encounter for other preprocedural examination: Secondary | ICD-10-CM | POA: Diagnosis not present

## 2022-05-01 DIAGNOSIS — R9431 Abnormal electrocardiogram [ECG] [EKG]: Secondary | ICD-10-CM | POA: Diagnosis not present

## 2022-05-01 DIAGNOSIS — Z789 Other specified health status: Secondary | ICD-10-CM | POA: Diagnosis not present

## 2022-05-01 DIAGNOSIS — Z6825 Body mass index (BMI) 25.0-25.9, adult: Secondary | ICD-10-CM | POA: Diagnosis not present

## 2022-05-01 DIAGNOSIS — Z89431 Acquired absence of right foot: Secondary | ICD-10-CM | POA: Diagnosis not present

## 2022-05-01 DIAGNOSIS — D631 Anemia in chronic kidney disease: Secondary | ICD-10-CM | POA: Diagnosis not present

## 2022-05-01 DIAGNOSIS — M869 Osteomyelitis, unspecified: Secondary | ICD-10-CM | POA: Diagnosis not present

## 2022-05-01 DIAGNOSIS — I071 Rheumatic tricuspid insufficiency: Secondary | ICD-10-CM | POA: Diagnosis not present

## 2022-05-02 DIAGNOSIS — D631 Anemia in chronic kidney disease: Secondary | ICD-10-CM | POA: Diagnosis not present

## 2022-05-02 DIAGNOSIS — N2581 Secondary hyperparathyroidism of renal origin: Secondary | ICD-10-CM | POA: Diagnosis not present

## 2022-05-02 DIAGNOSIS — Z992 Dependence on renal dialysis: Secondary | ICD-10-CM | POA: Diagnosis not present

## 2022-05-02 DIAGNOSIS — N186 End stage renal disease: Secondary | ICD-10-CM | POA: Diagnosis not present

## 2022-05-02 DIAGNOSIS — L02612 Cutaneous abscess of left foot: Secondary | ICD-10-CM | POA: Diagnosis not present

## 2022-05-02 DIAGNOSIS — M868X8 Other osteomyelitis, other site: Secondary | ICD-10-CM | POA: Diagnosis not present

## 2022-05-05 DIAGNOSIS — Z992 Dependence on renal dialysis: Secondary | ICD-10-CM | POA: Diagnosis not present

## 2022-05-05 DIAGNOSIS — N2581 Secondary hyperparathyroidism of renal origin: Secondary | ICD-10-CM | POA: Diagnosis not present

## 2022-05-05 DIAGNOSIS — D631 Anemia in chronic kidney disease: Secondary | ICD-10-CM | POA: Diagnosis not present

## 2022-05-05 DIAGNOSIS — Z5181 Encounter for therapeutic drug level monitoring: Secondary | ICD-10-CM | POA: Diagnosis not present

## 2022-05-05 DIAGNOSIS — N186 End stage renal disease: Secondary | ICD-10-CM | POA: Diagnosis not present

## 2022-05-05 DIAGNOSIS — I96 Gangrene, not elsewhere classified: Secondary | ICD-10-CM | POA: Diagnosis not present

## 2022-05-05 DIAGNOSIS — M868X8 Other osteomyelitis, other site: Secondary | ICD-10-CM | POA: Diagnosis not present

## 2022-05-05 DIAGNOSIS — L02612 Cutaneous abscess of left foot: Secondary | ICD-10-CM | POA: Diagnosis not present

## 2022-05-06 DIAGNOSIS — D62 Acute posthemorrhagic anemia: Secondary | ICD-10-CM | POA: Diagnosis not present

## 2022-05-06 DIAGNOSIS — I96 Gangrene, not elsewhere classified: Secondary | ICD-10-CM | POA: Diagnosis not present

## 2022-05-06 DIAGNOSIS — E039 Hypothyroidism, unspecified: Secondary | ICD-10-CM | POA: Diagnosis not present

## 2022-05-06 DIAGNOSIS — I2510 Atherosclerotic heart disease of native coronary artery without angina pectoris: Secondary | ICD-10-CM | POA: Diagnosis not present

## 2022-05-06 DIAGNOSIS — I132 Hypertensive heart and chronic kidney disease with heart failure and with stage 5 chronic kidney disease, or end stage renal disease: Secondary | ICD-10-CM | POA: Diagnosis not present

## 2022-05-06 DIAGNOSIS — D649 Anemia, unspecified: Secondary | ICD-10-CM | POA: Diagnosis not present

## 2022-05-06 DIAGNOSIS — E1159 Type 2 diabetes mellitus with other circulatory complications: Secondary | ICD-10-CM | POA: Diagnosis not present

## 2022-05-06 DIAGNOSIS — N2581 Secondary hyperparathyroidism of renal origin: Secondary | ICD-10-CM | POA: Diagnosis not present

## 2022-05-06 DIAGNOSIS — E1122 Type 2 diabetes mellitus with diabetic chronic kidney disease: Secondary | ICD-10-CM | POA: Diagnosis not present

## 2022-05-06 DIAGNOSIS — E440 Moderate protein-calorie malnutrition: Secondary | ICD-10-CM | POA: Diagnosis not present

## 2022-05-06 DIAGNOSIS — I272 Pulmonary hypertension, unspecified: Secondary | ICD-10-CM | POA: Diagnosis not present

## 2022-05-06 DIAGNOSIS — E1152 Type 2 diabetes mellitus with diabetic peripheral angiopathy with gangrene: Secondary | ICD-10-CM | POA: Diagnosis not present

## 2022-05-06 DIAGNOSIS — I5020 Unspecified systolic (congestive) heart failure: Secondary | ICD-10-CM | POA: Diagnosis not present

## 2022-05-06 DIAGNOSIS — L97529 Non-pressure chronic ulcer of other part of left foot with unspecified severity: Secondary | ICD-10-CM | POA: Diagnosis not present

## 2022-05-06 DIAGNOSIS — Z89422 Acquired absence of other left toe(s): Secondary | ICD-10-CM | POA: Diagnosis not present

## 2022-05-06 DIAGNOSIS — E1141 Type 2 diabetes mellitus with diabetic mononeuropathy: Secondary | ICD-10-CM | POA: Diagnosis not present

## 2022-05-06 DIAGNOSIS — D631 Anemia in chronic kidney disease: Secondary | ICD-10-CM | POA: Diagnosis not present

## 2022-05-06 DIAGNOSIS — M86172 Other acute osteomyelitis, left ankle and foot: Secondary | ICD-10-CM | POA: Diagnosis not present

## 2022-05-06 DIAGNOSIS — I5022 Chronic systolic (congestive) heart failure: Secondary | ICD-10-CM | POA: Diagnosis not present

## 2022-05-06 DIAGNOSIS — Z89432 Acquired absence of left foot: Secondary | ICD-10-CM | POA: Diagnosis not present

## 2022-05-06 DIAGNOSIS — R600 Localized edema: Secondary | ICD-10-CM | POA: Diagnosis not present

## 2022-05-06 DIAGNOSIS — I129 Hypertensive chronic kidney disease with stage 1 through stage 4 chronic kidney disease, or unspecified chronic kidney disease: Secondary | ICD-10-CM | POA: Diagnosis not present

## 2022-05-06 DIAGNOSIS — E7800 Pure hypercholesterolemia, unspecified: Secondary | ICD-10-CM | POA: Diagnosis not present

## 2022-05-06 DIAGNOSIS — I70268 Atherosclerosis of native arteries of extremities with gangrene, other extremity: Secondary | ICD-10-CM | POA: Diagnosis not present

## 2022-05-06 DIAGNOSIS — I071 Rheumatic tricuspid insufficiency: Secondary | ICD-10-CM | POA: Diagnosis not present

## 2022-05-06 DIAGNOSIS — L98499 Non-pressure chronic ulcer of skin of other sites with unspecified severity: Secondary | ICD-10-CM | POA: Diagnosis not present

## 2022-05-06 DIAGNOSIS — E8809 Other disorders of plasma-protein metabolism, not elsewhere classified: Secondary | ICD-10-CM | POA: Diagnosis not present

## 2022-05-06 DIAGNOSIS — E213 Hyperparathyroidism, unspecified: Secondary | ICD-10-CM | POA: Diagnosis not present

## 2022-05-06 DIAGNOSIS — N186 End stage renal disease: Secondary | ICD-10-CM | POA: Diagnosis not present

## 2022-05-06 DIAGNOSIS — Z89431 Acquired absence of right foot: Secondary | ICD-10-CM | POA: Diagnosis not present

## 2022-05-06 DIAGNOSIS — I252 Old myocardial infarction: Secondary | ICD-10-CM | POA: Diagnosis not present

## 2022-05-06 DIAGNOSIS — Z992 Dependence on renal dialysis: Secondary | ICD-10-CM | POA: Diagnosis not present

## 2022-05-06 DIAGNOSIS — M7989 Other specified soft tissue disorders: Secondary | ICD-10-CM | POA: Diagnosis not present

## 2022-05-06 DIAGNOSIS — I10 Essential (primary) hypertension: Secondary | ICD-10-CM | POA: Diagnosis not present

## 2022-05-06 DIAGNOSIS — N189 Chronic kidney disease, unspecified: Secondary | ICD-10-CM | POA: Diagnosis not present

## 2022-05-06 DIAGNOSIS — I120 Hypertensive chronic kidney disease with stage 5 chronic kidney disease or end stage renal disease: Secondary | ICD-10-CM | POA: Diagnosis not present

## 2022-05-06 DIAGNOSIS — E873 Alkalosis: Secondary | ICD-10-CM | POA: Diagnosis not present

## 2022-05-06 DIAGNOSIS — M869 Osteomyelitis, unspecified: Secondary | ICD-10-CM | POA: Diagnosis not present

## 2022-05-06 DIAGNOSIS — R9431 Abnormal electrocardiogram [ECG] [EKG]: Secondary | ICD-10-CM | POA: Diagnosis not present

## 2022-05-06 DIAGNOSIS — E782 Mixed hyperlipidemia: Secondary | ICD-10-CM | POA: Diagnosis not present

## 2022-05-06 DIAGNOSIS — I739 Peripheral vascular disease, unspecified: Secondary | ICD-10-CM | POA: Diagnosis not present

## 2022-05-06 DIAGNOSIS — E11621 Type 2 diabetes mellitus with foot ulcer: Secondary | ICD-10-CM | POA: Diagnosis not present

## 2022-05-06 DIAGNOSIS — G709 Myoneural disorder, unspecified: Secondary | ICD-10-CM | POA: Diagnosis not present

## 2022-05-06 DIAGNOSIS — E1169 Type 2 diabetes mellitus with other specified complication: Secondary | ICD-10-CM | POA: Diagnosis not present

## 2022-05-12 DIAGNOSIS — Z5181 Encounter for therapeutic drug level monitoring: Secondary | ICD-10-CM | POA: Diagnosis not present

## 2022-05-12 DIAGNOSIS — D631 Anemia in chronic kidney disease: Secondary | ICD-10-CM | POA: Diagnosis not present

## 2022-05-12 DIAGNOSIS — N186 End stage renal disease: Secondary | ICD-10-CM | POA: Diagnosis not present

## 2022-05-12 DIAGNOSIS — Z992 Dependence on renal dialysis: Secondary | ICD-10-CM | POA: Diagnosis not present

## 2022-05-12 DIAGNOSIS — M868X8 Other osteomyelitis, other site: Secondary | ICD-10-CM | POA: Diagnosis not present

## 2022-05-12 DIAGNOSIS — L02612 Cutaneous abscess of left foot: Secondary | ICD-10-CM | POA: Diagnosis not present

## 2022-05-12 DIAGNOSIS — N2581 Secondary hyperparathyroidism of renal origin: Secondary | ICD-10-CM | POA: Diagnosis not present

## 2022-05-12 DIAGNOSIS — I96 Gangrene, not elsewhere classified: Secondary | ICD-10-CM | POA: Diagnosis not present

## 2022-05-14 DIAGNOSIS — Z743 Need for continuous supervision: Secondary | ICD-10-CM | POA: Diagnosis not present

## 2022-05-14 DIAGNOSIS — I350 Nonrheumatic aortic (valve) stenosis: Secondary | ICD-10-CM | POA: Diagnosis not present

## 2022-05-14 DIAGNOSIS — I130 Hypertensive heart and chronic kidney disease with heart failure and stage 1 through stage 4 chronic kidney disease, or unspecified chronic kidney disease: Secondary | ICD-10-CM | POA: Diagnosis not present

## 2022-05-14 DIAGNOSIS — Z89512 Acquired absence of left leg below knee: Secondary | ICD-10-CM | POA: Diagnosis not present

## 2022-05-14 DIAGNOSIS — N186 End stage renal disease: Secondary | ICD-10-CM | POA: Diagnosis not present

## 2022-05-14 DIAGNOSIS — Z992 Dependence on renal dialysis: Secondary | ICD-10-CM | POA: Diagnosis not present

## 2022-05-14 DIAGNOSIS — I70222 Atherosclerosis of native arteries of extremities with rest pain, left leg: Secondary | ICD-10-CM | POA: Diagnosis not present

## 2022-05-14 DIAGNOSIS — S88112A Complete traumatic amputation at level between knee and ankle, left lower leg, initial encounter: Secondary | ICD-10-CM | POA: Diagnosis not present

## 2022-05-14 DIAGNOSIS — E7800 Pure hypercholesterolemia, unspecified: Secondary | ICD-10-CM | POA: Diagnosis not present

## 2022-05-14 DIAGNOSIS — E875 Hyperkalemia: Secondary | ICD-10-CM | POA: Diagnosis not present

## 2022-05-14 DIAGNOSIS — I70229 Atherosclerosis of native arteries of extremities with rest pain, unspecified extremity: Secondary | ICD-10-CM | POA: Diagnosis not present

## 2022-05-14 DIAGNOSIS — H3322 Serous retinal detachment, left eye: Secondary | ICD-10-CM | POA: Diagnosis not present

## 2022-05-14 DIAGNOSIS — I129 Hypertensive chronic kidney disease with stage 1 through stage 4 chronic kidney disease, or unspecified chronic kidney disease: Secondary | ICD-10-CM | POA: Diagnosis not present

## 2022-05-14 DIAGNOSIS — Z7982 Long term (current) use of aspirin: Secondary | ICD-10-CM | POA: Diagnosis not present

## 2022-05-14 DIAGNOSIS — I517 Cardiomegaly: Secondary | ICD-10-CM | POA: Diagnosis not present

## 2022-05-14 DIAGNOSIS — I509 Heart failure, unspecified: Secondary | ICD-10-CM | POA: Diagnosis not present

## 2022-05-14 DIAGNOSIS — E440 Moderate protein-calorie malnutrition: Secondary | ICD-10-CM | POA: Diagnosis not present

## 2022-05-14 DIAGNOSIS — N2581 Secondary hyperparathyroidism of renal origin: Secondary | ICD-10-CM | POA: Diagnosis not present

## 2022-05-14 DIAGNOSIS — G4700 Insomnia, unspecified: Secondary | ICD-10-CM | POA: Diagnosis not present

## 2022-05-14 DIAGNOSIS — I252 Old myocardial infarction: Secondary | ICD-10-CM | POA: Diagnosis not present

## 2022-05-14 DIAGNOSIS — Z823 Family history of stroke: Secondary | ICD-10-CM | POA: Diagnosis not present

## 2022-05-14 DIAGNOSIS — I25119 Atherosclerotic heart disease of native coronary artery with unspecified angina pectoris: Secondary | ICD-10-CM | POA: Diagnosis not present

## 2022-05-14 DIAGNOSIS — I5022 Chronic systolic (congestive) heart failure: Secondary | ICD-10-CM | POA: Diagnosis not present

## 2022-05-14 DIAGNOSIS — Z4781 Encounter for orthopedic aftercare following surgical amputation: Secondary | ICD-10-CM | POA: Diagnosis not present

## 2022-05-14 DIAGNOSIS — F32A Depression, unspecified: Secondary | ICD-10-CM | POA: Diagnosis not present

## 2022-05-14 DIAGNOSIS — L02612 Cutaneous abscess of left foot: Secondary | ICD-10-CM | POA: Diagnosis not present

## 2022-05-14 DIAGNOSIS — I219 Acute myocardial infarction, unspecified: Secondary | ICD-10-CM | POA: Diagnosis not present

## 2022-05-14 DIAGNOSIS — E039 Hypothyroidism, unspecified: Secondary | ICD-10-CM | POA: Diagnosis not present

## 2022-05-14 DIAGNOSIS — E782 Mixed hyperlipidemia: Secondary | ICD-10-CM | POA: Diagnosis not present

## 2022-05-14 DIAGNOSIS — B9562 Methicillin resistant Staphylococcus aureus infection as the cause of diseases classified elsewhere: Secondary | ICD-10-CM | POA: Diagnosis not present

## 2022-05-14 DIAGNOSIS — E119 Type 2 diabetes mellitus without complications: Secondary | ICD-10-CM | POA: Diagnosis not present

## 2022-05-14 DIAGNOSIS — Z89421 Acquired absence of other right toe(s): Secondary | ICD-10-CM | POA: Diagnosis not present

## 2022-05-14 DIAGNOSIS — Z9889 Other specified postprocedural states: Secondary | ICD-10-CM | POA: Diagnosis not present

## 2022-05-14 DIAGNOSIS — M868X8 Other osteomyelitis, other site: Secondary | ICD-10-CM | POA: Diagnosis not present

## 2022-05-14 DIAGNOSIS — D631 Anemia in chronic kidney disease: Secondary | ICD-10-CM | POA: Diagnosis not present

## 2022-05-14 DIAGNOSIS — E113593 Type 2 diabetes mellitus with proliferative diabetic retinopathy without macular edema, bilateral: Secondary | ICD-10-CM | POA: Diagnosis not present

## 2022-05-14 DIAGNOSIS — I70262 Atherosclerosis of native arteries of extremities with gangrene, left leg: Secondary | ICD-10-CM | POA: Diagnosis not present

## 2022-05-14 DIAGNOSIS — N189 Chronic kidney disease, unspecified: Secondary | ICD-10-CM | POA: Diagnosis not present

## 2022-05-14 DIAGNOSIS — Z833 Family history of diabetes mellitus: Secondary | ICD-10-CM | POA: Diagnosis not present

## 2022-05-14 DIAGNOSIS — I132 Hypertensive heart and chronic kidney disease with heart failure and with stage 5 chronic kidney disease, or end stage renal disease: Secondary | ICD-10-CM | POA: Diagnosis not present

## 2022-05-14 DIAGNOSIS — Z4689 Encounter for fitting and adjustment of other specified devices: Secondary | ICD-10-CM | POA: Diagnosis not present

## 2022-05-14 DIAGNOSIS — T8754 Necrosis of amputation stump, left lower extremity: Secondary | ICD-10-CM | POA: Diagnosis not present

## 2022-05-14 DIAGNOSIS — I120 Hypertensive chronic kidney disease with stage 5 chronic kidney disease or end stage renal disease: Secondary | ICD-10-CM | POA: Diagnosis not present

## 2022-05-14 DIAGNOSIS — E1122 Type 2 diabetes mellitus with diabetic chronic kidney disease: Secondary | ICD-10-CM | POA: Diagnosis not present

## 2022-05-14 DIAGNOSIS — M898X6 Other specified disorders of bone, lower leg: Secondary | ICD-10-CM | POA: Diagnosis not present

## 2022-05-14 DIAGNOSIS — Z885 Allergy status to narcotic agent status: Secondary | ICD-10-CM | POA: Diagnosis not present

## 2022-05-14 DIAGNOSIS — T8789 Other complications of amputation stump: Secondary | ICD-10-CM | POA: Diagnosis not present

## 2022-05-14 DIAGNOSIS — I739 Peripheral vascular disease, unspecified: Secondary | ICD-10-CM | POA: Diagnosis not present

## 2022-05-14 DIAGNOSIS — Z79899 Other long term (current) drug therapy: Secondary | ICD-10-CM | POA: Diagnosis not present

## 2022-05-14 DIAGNOSIS — G5792 Unspecified mononeuropathy of left lower limb: Secondary | ICD-10-CM | POA: Diagnosis not present

## 2022-05-14 DIAGNOSIS — I10 Essential (primary) hypertension: Secondary | ICD-10-CM | POA: Diagnosis not present

## 2022-05-14 DIAGNOSIS — R918 Other nonspecific abnormal finding of lung field: Secondary | ICD-10-CM | POA: Diagnosis not present

## 2022-05-14 DIAGNOSIS — R404 Transient alteration of awareness: Secondary | ICD-10-CM | POA: Diagnosis not present

## 2022-05-14 DIAGNOSIS — M25511 Pain in right shoulder: Secondary | ICD-10-CM | POA: Diagnosis not present

## 2022-05-14 DIAGNOSIS — L03116 Cellulitis of left lower limb: Secondary | ICD-10-CM | POA: Diagnosis not present

## 2022-05-14 DIAGNOSIS — Z955 Presence of coronary angioplasty implant and graft: Secondary | ICD-10-CM | POA: Diagnosis not present

## 2022-05-14 DIAGNOSIS — I272 Pulmonary hypertension, unspecified: Secondary | ICD-10-CM | POA: Diagnosis not present

## 2022-05-14 DIAGNOSIS — G546 Phantom limb syndrome with pain: Secondary | ICD-10-CM | POA: Diagnosis not present

## 2022-05-14 DIAGNOSIS — D649 Anemia, unspecified: Secondary | ICD-10-CM | POA: Diagnosis not present

## 2022-05-14 DIAGNOSIS — Z89431 Acquired absence of right foot: Secondary | ICD-10-CM | POA: Diagnosis not present

## 2022-05-14 DIAGNOSIS — I5020 Unspecified systolic (congestive) heart failure: Secondary | ICD-10-CM | POA: Diagnosis not present

## 2022-05-14 DIAGNOSIS — I2510 Atherosclerotic heart disease of native coronary artery without angina pectoris: Secondary | ICD-10-CM | POA: Diagnosis not present

## 2022-05-15 DIAGNOSIS — R918 Other nonspecific abnormal finding of lung field: Secondary | ICD-10-CM | POA: Diagnosis not present

## 2022-05-15 DIAGNOSIS — I517 Cardiomegaly: Secondary | ICD-10-CM | POA: Diagnosis not present

## 2022-05-16 DIAGNOSIS — N186 End stage renal disease: Secondary | ICD-10-CM | POA: Diagnosis not present

## 2022-05-16 DIAGNOSIS — Z992 Dependence on renal dialysis: Secondary | ICD-10-CM | POA: Diagnosis not present

## 2022-05-19 DIAGNOSIS — N186 End stage renal disease: Secondary | ICD-10-CM | POA: Diagnosis not present

## 2022-05-19 DIAGNOSIS — Z992 Dependence on renal dialysis: Secondary | ICD-10-CM | POA: Diagnosis not present

## 2022-05-21 DIAGNOSIS — I2510 Atherosclerotic heart disease of native coronary artery without angina pectoris: Secondary | ICD-10-CM | POA: Diagnosis not present

## 2022-05-21 DIAGNOSIS — N186 End stage renal disease: Secondary | ICD-10-CM | POA: Diagnosis not present

## 2022-05-21 DIAGNOSIS — I509 Heart failure, unspecified: Secondary | ICD-10-CM | POA: Diagnosis not present

## 2022-05-21 DIAGNOSIS — E039 Hypothyroidism, unspecified: Secondary | ICD-10-CM | POA: Diagnosis not present

## 2022-05-21 DIAGNOSIS — Z992 Dependence on renal dialysis: Secondary | ICD-10-CM | POA: Diagnosis not present

## 2022-05-21 DIAGNOSIS — I70262 Atherosclerosis of native arteries of extremities with gangrene, left leg: Secondary | ICD-10-CM | POA: Diagnosis not present

## 2022-05-21 DIAGNOSIS — T8754 Necrosis of amputation stump, left lower extremity: Secondary | ICD-10-CM | POA: Diagnosis not present

## 2022-05-21 DIAGNOSIS — E119 Type 2 diabetes mellitus without complications: Secondary | ICD-10-CM | POA: Diagnosis not present

## 2022-05-21 DIAGNOSIS — I129 Hypertensive chronic kidney disease with stage 1 through stage 4 chronic kidney disease, or unspecified chronic kidney disease: Secondary | ICD-10-CM | POA: Diagnosis not present

## 2022-05-21 DIAGNOSIS — N189 Chronic kidney disease, unspecified: Secondary | ICD-10-CM | POA: Diagnosis not present

## 2022-05-21 DIAGNOSIS — I219 Acute myocardial infarction, unspecified: Secondary | ICD-10-CM | POA: Diagnosis not present

## 2022-05-21 DIAGNOSIS — D649 Anemia, unspecified: Secondary | ICD-10-CM | POA: Diagnosis not present

## 2022-05-22 DIAGNOSIS — N186 End stage renal disease: Secondary | ICD-10-CM | POA: Diagnosis not present

## 2022-05-22 DIAGNOSIS — Z992 Dependence on renal dialysis: Secondary | ICD-10-CM | POA: Diagnosis not present

## 2022-05-23 DIAGNOSIS — G5792 Unspecified mononeuropathy of left lower limb: Secondary | ICD-10-CM | POA: Diagnosis not present

## 2022-05-24 DIAGNOSIS — N186 End stage renal disease: Secondary | ICD-10-CM | POA: Diagnosis not present

## 2022-05-24 DIAGNOSIS — Z992 Dependence on renal dialysis: Secondary | ICD-10-CM | POA: Diagnosis not present

## 2022-05-25 DIAGNOSIS — E113593 Type 2 diabetes mellitus with proliferative diabetic retinopathy without macular edema, bilateral: Secondary | ICD-10-CM | POA: Diagnosis not present

## 2022-05-26 ENCOUNTER — Ambulatory Visit: Payer: Self-pay | Admitting: Licensed Clinical Social Worker

## 2022-05-26 DIAGNOSIS — Z992 Dependence on renal dialysis: Secondary | ICD-10-CM | POA: Diagnosis not present

## 2022-05-26 DIAGNOSIS — N186 End stage renal disease: Secondary | ICD-10-CM | POA: Diagnosis not present

## 2022-05-26 NOTE — Patient Outreach (Signed)
  Care Coordination   Initial Visit Note   05/26/2022 Name: [name] MRN: [id number] DOB: 1966-10-06  [name] is a 56 y.o. year old male who sees [name], MD for primary care. I spoke with  [name] by phone today  What matters to the patients health and wellness today?  Wants to be able to access his home and to be able to go up and down stairs    Goals Addressed               This Visit's Progress     Patient will be able to get in and out of his home and get up and down stairs (pt-stated)        Care Coordination Interventions:  Depression screen reviewed  Active listening used Coping skills discussed Current medical challenges discussed Discussed client home access Discussed client ability to go up and down stairs        SDOH assessments and interventions completed:  Yes  SDOH Interventions Today    Flowsheet Row Most Recent Value  SDOH Interventions   Stress Interventions Provide Counseling  [stress related to managing medical needs]  Depression Interventions/Treatment  Counseling        Care Coordination Interventions Activated:  Yes  Care Coordination Interventions:  Yes, provided   Follow up plan: Follow up call scheduled for 06/25/22 at 1:00 PM     Encounter Outcome:  Pt. Visit Completed

## 2022-05-26 NOTE — Patient Instructions (Signed)
Visit Information  Thank you for taking time to visit with me today. Please don't hesitate to contact me if I can be of assistance to you before our next scheduled telephone appointment.  Following are the goals we discussed today:  Our next appointment is by telephone on 06/25/22 at 1:00 PM  Please call the care guide team at [phone] if you need to cancel or reschedule your appointment.   If you are experiencing a Mental Health or [place] or need someone to talk to, please go to [facility] [place] [phone])   Following is a copy of your full plan of care:   Care Coordination Interventions:  Depression screen reviewed  Active listening used Coping skills discussed Current medical challenges discussed Discussed client home access Discussed client ability to go up and down stairs   Mr. Mangus was given information about Care Management services by the embedded care coordination team including:  Care Management services include personalized support from designated clinical staff supervised by his physician, including individualized plan of care and coordination with other care providers 24/7 contact phone numbers for assistance for urgent and routine care needs. The patient may stop CCM services at any time (effective at the end of the month) by phone call to the office staff.  Patient agreed to services and verbal consent obtained.     Norva Riffle.Mikiyah Glasner MSW, [place] Holiday representative [facility] Care Management [phone]

## 2022-05-27 DIAGNOSIS — N186 End stage renal disease: Secondary | ICD-10-CM | POA: Diagnosis not present

## 2022-05-27 DIAGNOSIS — G5792 Unspecified mononeuropathy of left lower limb: Secondary | ICD-10-CM | POA: Diagnosis not present

## 2022-05-27 DIAGNOSIS — Z992 Dependence on renal dialysis: Secondary | ICD-10-CM | POA: Diagnosis not present

## 2022-05-27 DIAGNOSIS — I70222 Atherosclerosis of native arteries of extremities with rest pain, left leg: Secondary | ICD-10-CM | POA: Diagnosis not present

## 2022-05-28 DIAGNOSIS — I70262 Atherosclerosis of native arteries of extremities with gangrene, left leg: Secondary | ICD-10-CM | POA: Diagnosis not present

## 2022-05-28 DIAGNOSIS — T8754 Necrosis of amputation stump, left lower extremity: Secondary | ICD-10-CM | POA: Diagnosis not present

## 2022-05-28 DIAGNOSIS — Z992 Dependence on renal dialysis: Secondary | ICD-10-CM | POA: Diagnosis not present

## 2022-05-28 DIAGNOSIS — N186 End stage renal disease: Secondary | ICD-10-CM | POA: Diagnosis not present

## 2022-05-28 DIAGNOSIS — M898X6 Other specified disorders of bone, lower leg: Secondary | ICD-10-CM | POA: Diagnosis not present

## 2022-05-28 DIAGNOSIS — T8789 Other complications of amputation stump: Secondary | ICD-10-CM | POA: Diagnosis not present

## 2022-05-29 DIAGNOSIS — N186 End stage renal disease: Secondary | ICD-10-CM | POA: Diagnosis not present

## 2022-05-29 DIAGNOSIS — Z9889 Other specified postprocedural states: Secondary | ICD-10-CM | POA: Diagnosis not present

## 2022-05-29 DIAGNOSIS — I70229 Atherosclerosis of native arteries of extremities with rest pain, unspecified extremity: Secondary | ICD-10-CM | POA: Diagnosis not present

## 2022-05-29 DIAGNOSIS — Z992 Dependence on renal dialysis: Secondary | ICD-10-CM | POA: Diagnosis not present

## 2022-05-30 DIAGNOSIS — Z9889 Other specified postprocedural states: Secondary | ICD-10-CM | POA: Diagnosis not present

## 2022-05-30 DIAGNOSIS — N186 End stage renal disease: Secondary | ICD-10-CM | POA: Diagnosis not present

## 2022-05-30 DIAGNOSIS — I70229 Atherosclerosis of native arteries of extremities with rest pain, unspecified extremity: Secondary | ICD-10-CM | POA: Diagnosis not present

## 2022-05-30 DIAGNOSIS — Z992 Dependence on renal dialysis: Secondary | ICD-10-CM | POA: Diagnosis not present

## 2022-05-31 DIAGNOSIS — N186 End stage renal disease: Secondary | ICD-10-CM | POA: Diagnosis not present

## 2022-05-31 DIAGNOSIS — Z9889 Other specified postprocedural states: Secondary | ICD-10-CM | POA: Diagnosis not present

## 2022-05-31 DIAGNOSIS — Z992 Dependence on renal dialysis: Secondary | ICD-10-CM | POA: Diagnosis not present

## 2022-05-31 DIAGNOSIS — I70229 Atherosclerosis of native arteries of extremities with rest pain, unspecified extremity: Secondary | ICD-10-CM | POA: Diagnosis not present

## 2022-06-03 DIAGNOSIS — N186 End stage renal disease: Secondary | ICD-10-CM | POA: Diagnosis not present

## 2022-06-03 DIAGNOSIS — Z992 Dependence on renal dialysis: Secondary | ICD-10-CM | POA: Diagnosis not present

## 2022-06-05 DIAGNOSIS — Z992 Dependence on renal dialysis: Secondary | ICD-10-CM | POA: Diagnosis not present

## 2022-06-05 DIAGNOSIS — N186 End stage renal disease: Secondary | ICD-10-CM | POA: Diagnosis not present

## 2022-06-07 DIAGNOSIS — N186 End stage renal disease: Secondary | ICD-10-CM | POA: Diagnosis not present

## 2022-06-07 DIAGNOSIS — Z992 Dependence on renal dialysis: Secondary | ICD-10-CM | POA: Diagnosis not present

## 2022-06-10 ENCOUNTER — Ambulatory Visit: Payer: Self-pay | Admitting: Licensed Clinical Social Worker

## 2022-06-10 NOTE — Patient Outreach (Signed)
  Care Coordination   Follow Up Visit Note   06/10/2022 Name: [name] MRN: [id number] DOB: [birth year]  [name] is a 56 y.o. year old male who sees [name], MD for primary care. I spoke with  [name] / spouse of client, [name], by phone today  What matters to the patients health and wellness today?  Patient seeking SNF placement on discharge from [facility].    Goals Addressed             This Visit's Progress    Patient is seeking SNF placement on discharge from [facility].       Care Coordination Interventions:  Active listening / Reflection utilized  Emotional Support Provided Informed [name] of Care Coordination program support services Discussed client status at [facility]. Discussed [name] working with discharge planner at medical facility to plan for client discharge Discussed SNF admission documents  usually requested. Discussed Dialysis needs of client. Discussed client health needs  with [name] Encouraged [name] or [name], client, to call LCSW as needed to discuss care coordination needs of client. LCSW phone number provided [phone])        SDOH assessments and interventions completed:  No     Care Coordination Interventions Activated:  Yes  Care Coordination Interventions:  Yes, provided   Follow up plan: No further intervention required.   Encounter Outcome:  Pt. Visit Completed

## 2022-06-10 NOTE — Patient Instructions (Addendum)
Visit Information  Thank you for taking time to visit with me today. Please don't hesitate to contact me if I can be of assistance to you before our next scheduled telephone appointment.  Following are the goals we discussed today:   No further intervention is needed at present  Please call the care guide team at [phone] if you need to cancel or reschedule your appointment.   If you are experiencing a Mental Health or [place] or need someone to talk to, please go to [facility] [place] [phone])   Following is a copy of your full plan of care:   Care Coordination Interventions:  Active listening / Reflection utilized  Emotional Support Provided Informed [name] of Care Coordination program support services Discussed client status at [facility]. Discussed [name] working with discharge planner at medical facility to plan for client discharge Discussed SNF admission documents  usually requested. Discussed Dialysis needs of client. Discussed client health needs  with [name] Encouraged [name] or [name], client, to call LCSW as needed to discuss care coordination needs of client. LCSW phone number provided [phone])   [name] was given information about Care Management services by the embedded care coordination team including:  Care Management services include personalized support from designated clinical staff supervised by his physician, including individualized plan of care and coordination with other care providers 24/7 contact phone numbers for assistance for urgent and routine care needs. The patient may stop CCM services at any time (effective at the end of the month) by phone call to the office staff.  Patient agreed to services and verbal consent obtained.    [name].Serafin [place]atur MSW, [place] Holiday representative [facility] Care Management [phone]

## 2022-06-12 DIAGNOSIS — E7800 Pure hypercholesterolemia, unspecified: Secondary | ICD-10-CM | POA: Diagnosis not present

## 2022-06-12 DIAGNOSIS — I130 Hypertensive heart and chronic kidney disease with heart failure and stage 1 through stage 4 chronic kidney disease, or unspecified chronic kidney disease: Secondary | ICD-10-CM | POA: Insufficient documentation

## 2022-06-12 DIAGNOSIS — Z89512 Acquired absence of left leg below knee: Secondary | ICD-10-CM | POA: Diagnosis not present

## 2022-06-12 DIAGNOSIS — N2581 Secondary hyperparathyroidism of renal origin: Secondary | ICD-10-CM | POA: Diagnosis not present

## 2022-06-12 DIAGNOSIS — E039 Hypothyroidism, unspecified: Secondary | ICD-10-CM | POA: Diagnosis not present

## 2022-06-12 DIAGNOSIS — I350 Nonrheumatic aortic (valve) stenosis: Secondary | ICD-10-CM | POA: Diagnosis not present

## 2022-06-12 DIAGNOSIS — T8781 Dehiscence of amputation stump: Secondary | ICD-10-CM | POA: Diagnosis not present

## 2022-06-12 DIAGNOSIS — G4700 Insomnia, unspecified: Secondary | ICD-10-CM | POA: Diagnosis not present

## 2022-06-12 DIAGNOSIS — Z7902 Long term (current) use of antithrombotics/antiplatelets: Secondary | ICD-10-CM | POA: Diagnosis not present

## 2022-06-12 DIAGNOSIS — Z992 Dependence on renal dialysis: Secondary | ICD-10-CM | POA: Diagnosis not present

## 2022-06-12 DIAGNOSIS — I493 Ventricular premature depolarization: Secondary | ICD-10-CM | POA: Diagnosis not present

## 2022-06-12 DIAGNOSIS — E875 Hyperkalemia: Secondary | ICD-10-CM | POA: Diagnosis not present

## 2022-06-12 DIAGNOSIS — I132 Hypertensive heart and chronic kidney disease with heart failure and with stage 5 chronic kidney disease, or end stage renal disease: Secondary | ICD-10-CM | POA: Diagnosis not present

## 2022-06-12 DIAGNOSIS — E1122 Type 2 diabetes mellitus with diabetic chronic kidney disease: Secondary | ICD-10-CM | POA: Diagnosis not present

## 2022-06-12 DIAGNOSIS — E440 Moderate protein-calorie malnutrition: Secondary | ICD-10-CM | POA: Diagnosis not present

## 2022-06-12 DIAGNOSIS — D631 Anemia in chronic kidney disease: Secondary | ICD-10-CM | POA: Diagnosis not present

## 2022-06-12 DIAGNOSIS — R404 Transient alteration of awareness: Secondary | ICD-10-CM | POA: Diagnosis not present

## 2022-06-12 DIAGNOSIS — I509 Heart failure, unspecified: Secondary | ICD-10-CM | POA: Insufficient documentation

## 2022-06-12 DIAGNOSIS — Z89431 Acquired absence of right foot: Secondary | ICD-10-CM | POA: Diagnosis not present

## 2022-06-12 DIAGNOSIS — I70222 Atherosclerosis of native arteries of extremities with rest pain, left leg: Secondary | ICD-10-CM | POA: Diagnosis not present

## 2022-06-12 DIAGNOSIS — L97823 Non-pressure chronic ulcer of other part of left lower leg with necrosis of muscle: Secondary | ICD-10-CM | POA: Diagnosis not present

## 2022-06-12 DIAGNOSIS — R9431 Abnormal electrocardiogram [ECG] [EKG]: Secondary | ICD-10-CM | POA: Diagnosis not present

## 2022-06-12 DIAGNOSIS — Z4781 Encounter for orthopedic aftercare following surgical amputation: Secondary | ICD-10-CM | POA: Diagnosis not present

## 2022-06-12 DIAGNOSIS — S88112A Complete traumatic amputation at level between knee and ankle, left lower leg, initial encounter: Secondary | ICD-10-CM | POA: Diagnosis not present

## 2022-06-12 DIAGNOSIS — L03116 Cellulitis of left lower limb: Secondary | ICD-10-CM | POA: Diagnosis not present

## 2022-06-12 DIAGNOSIS — M79669 Pain in unspecified lower leg: Secondary | ICD-10-CM | POA: Diagnosis not present

## 2022-06-12 DIAGNOSIS — T8189XA Other complications of procedures, not elsewhere classified, initial encounter: Secondary | ICD-10-CM | POA: Diagnosis not present

## 2022-06-12 DIAGNOSIS — Y9289 Other specified places as the place of occurrence of the external cause: Secondary | ICD-10-CM | POA: Diagnosis not present

## 2022-06-12 DIAGNOSIS — Z4689 Encounter for fitting and adjustment of other specified devices: Secondary | ICD-10-CM | POA: Diagnosis not present

## 2022-06-12 DIAGNOSIS — N186 End stage renal disease: Secondary | ICD-10-CM | POA: Diagnosis not present

## 2022-06-12 DIAGNOSIS — Z7982 Long term (current) use of aspirin: Secondary | ICD-10-CM | POA: Diagnosis not present

## 2022-06-12 DIAGNOSIS — I10 Essential (primary) hypertension: Secondary | ICD-10-CM | POA: Diagnosis not present

## 2022-06-12 DIAGNOSIS — I2510 Atherosclerotic heart disease of native coronary artery without angina pectoris: Secondary | ICD-10-CM | POA: Diagnosis not present

## 2022-06-12 DIAGNOSIS — J3489 Other specified disorders of nose and nasal sinuses: Secondary | ICD-10-CM | POA: Diagnosis not present

## 2022-06-12 DIAGNOSIS — F32A Depression, unspecified: Secondary | ICD-10-CM | POA: Diagnosis not present

## 2022-06-12 DIAGNOSIS — I739 Peripheral vascular disease, unspecified: Secondary | ICD-10-CM | POA: Diagnosis not present

## 2022-06-12 DIAGNOSIS — I5022 Chronic systolic (congestive) heart failure: Secondary | ICD-10-CM | POA: Diagnosis not present

## 2022-06-12 DIAGNOSIS — W19XXXA Unspecified fall, initial encounter: Secondary | ICD-10-CM | POA: Diagnosis not present

## 2022-06-12 DIAGNOSIS — T8789 Other complications of amputation stump: Secondary | ICD-10-CM | POA: Diagnosis not present

## 2022-06-12 DIAGNOSIS — I25119 Atherosclerotic heart disease of native coronary artery with unspecified angina pectoris: Secondary | ICD-10-CM | POA: Diagnosis not present

## 2022-06-12 DIAGNOSIS — Z4889 Encounter for other specified surgical aftercare: Secondary | ICD-10-CM | POA: Diagnosis not present

## 2022-06-12 DIAGNOSIS — Z9889 Other specified postprocedural states: Secondary | ICD-10-CM | POA: Diagnosis not present

## 2022-06-12 DIAGNOSIS — Z743 Need for continuous supervision: Secondary | ICD-10-CM | POA: Diagnosis not present

## 2022-06-12 DIAGNOSIS — Z89421 Acquired absence of other right toe(s): Secondary | ICD-10-CM | POA: Diagnosis not present

## 2022-06-12 DIAGNOSIS — I5020 Unspecified systolic (congestive) heart failure: Secondary | ICD-10-CM | POA: Diagnosis not present

## 2022-06-12 DIAGNOSIS — J301 Allergic rhinitis due to pollen: Secondary | ICD-10-CM | POA: Diagnosis not present

## 2022-06-12 DIAGNOSIS — Z7901 Long term (current) use of anticoagulants: Secondary | ICD-10-CM | POA: Diagnosis not present

## 2022-06-12 DIAGNOSIS — R7889 Finding of other specified substances, not normally found in blood: Secondary | ICD-10-CM | POA: Diagnosis not present

## 2022-06-12 DIAGNOSIS — R52 Pain, unspecified: Secondary | ICD-10-CM | POA: Diagnosis not present

## 2022-06-12 DIAGNOSIS — Z955 Presence of coronary angioplasty implant and graft: Secondary | ICD-10-CM | POA: Diagnosis not present

## 2022-06-13 DIAGNOSIS — Z89429 Acquired absence of other toe(s), unspecified side: Secondary | ICD-10-CM | POA: Insufficient documentation

## 2022-06-13 DIAGNOSIS — Z992 Dependence on renal dialysis: Secondary | ICD-10-CM | POA: Diagnosis not present

## 2022-06-13 DIAGNOSIS — N2581 Secondary hyperparathyroidism of renal origin: Secondary | ICD-10-CM | POA: Diagnosis not present

## 2022-06-13 DIAGNOSIS — N186 End stage renal disease: Secondary | ICD-10-CM | POA: Diagnosis not present

## 2022-06-16 DIAGNOSIS — N2581 Secondary hyperparathyroidism of renal origin: Secondary | ICD-10-CM | POA: Diagnosis not present

## 2022-06-16 DIAGNOSIS — Z992 Dependence on renal dialysis: Secondary | ICD-10-CM | POA: Diagnosis not present

## 2022-06-16 DIAGNOSIS — N186 End stage renal disease: Secondary | ICD-10-CM | POA: Diagnosis not present

## 2022-06-18 DIAGNOSIS — N2581 Secondary hyperparathyroidism of renal origin: Secondary | ICD-10-CM | POA: Diagnosis not present

## 2022-06-18 DIAGNOSIS — Z992 Dependence on renal dialysis: Secondary | ICD-10-CM | POA: Diagnosis not present

## 2022-06-18 DIAGNOSIS — N186 End stage renal disease: Secondary | ICD-10-CM | POA: Diagnosis not present

## 2022-06-19 DIAGNOSIS — J3489 Other specified disorders of nose and nasal sinuses: Secondary | ICD-10-CM | POA: Diagnosis not present

## 2022-06-19 DIAGNOSIS — J301 Allergic rhinitis due to pollen: Secondary | ICD-10-CM | POA: Diagnosis not present

## 2022-06-20 ENCOUNTER — Ambulatory Visit: Payer: Self-pay | Admitting: Licensed Clinical Social Worker

## 2022-06-20 DIAGNOSIS — D631 Anemia in chronic kidney disease: Secondary | ICD-10-CM | POA: Diagnosis not present

## 2022-06-20 DIAGNOSIS — N186 End stage renal disease: Secondary | ICD-10-CM | POA: Diagnosis not present

## 2022-06-20 DIAGNOSIS — Z992 Dependence on renal dialysis: Secondary | ICD-10-CM | POA: Diagnosis not present

## 2022-06-20 DIAGNOSIS — N2581 Secondary hyperparathyroidism of renal origin: Secondary | ICD-10-CM | POA: Diagnosis not present

## 2022-06-20 NOTE — Patient Instructions (Signed)
Visit Information  Thank you for taking time to visit with me today. Please don't hesitate to contact me if I can be of assistance to you before our next scheduled telephone appointment.  Following are the goals we discussed today:   Our next appointment is by telephone on 07/14/22 at 9:30 AM   Please call the care guide team at [phone] if you need to cancel or reschedule your appointment.   If you are experiencing a Mental Health or [place] or need someone to talk to, please go to [facility] [place] [phone])   Following is a copy of your full plan of care:   Care Coordination Interventions:  Active listening / Reflection utilized  Emotional Support Provided Problem [name] strategies reviewed Discussed client status. He said he is receiving care in [place] at [facility].  He and wife [name] are discussing discharge plans and what he would need when he discharges back home LCSW encouraged client to communicate with SW or RN discharge planner at SNF to begin planning for his discharge from [facility] said he does not have ramp at home LCSW discussed collapsible portable ramps. LCSW gave [name] the name of [name] in [place], [place] as agency that sometimes sales portable aluminum ramps.  Rashidi or spouse [name] to contact that agency or other medical supply agencies to determine price of portable ramp. [name] said his bedroom at home is on second floor. Discussion with [name] about setting up bed on first floor, perhaps in living room to allow him to sleep there when he is first discharged back home. [name] and spouse to call DME providers to ask about cost of rental of hospital bed for client. LCSW discussed with [place] HHPT, HHOT, HHNursing support . Encouraged [name] to talk with facility SW or RN discharge planner about in home support services available for client. Provided counseling  support Provided [name]  with name and phone number of LCSW   [name] was given information about Care Management services by the embedded care coordination team including:  Care Management services include personalized support from designated clinical staff supervised by his physician, including individualized plan of care and coordination with other care providers 24/7 contact phone numbers for assistance for urgent and routine care needs. The patient may stop CCM services at any time (effective at the end of the month) by phone call to the office staff.  Patient agreed to services and verbal consent obtained.   [name].[name] MSW, [place] Holiday representative [facility] Care Management [phone]

## 2022-06-20 NOTE — Patient Outreach (Signed)
  Care Coordination   Follow Up Visit Note   06/20/2022 Name: BRITTAN MAPEL MRN: [id number] DOB: May 01, 1966  Valla Leaver is a 56 y.o. year old male who sees [name], MD for primary care. I spoke with  Valla Leaver by phone today.  What matters to the patients health and wellness today?  Patient wants to begin discharge planning with discharge planner at SNF    Goals Addressed             This Visit's Progress    Patient wants to begin discharge planning with discharge planner at [facility]       Care Coordination Interventions:   Active listening / Reflection utilized  Emotional Support Provided Problem [place] strategies reviewed Discussed client status. He said he is receiving care in [place] at [facility].  He and wife Otila Kluver are discussing discharge plans and what he would need when he discharges back home LCSW encouraged client to communicate with SW or RN discharge planner at SNF to begin planning for [facility] said he does not have ramp at home LCSW discussed collapsible portable ramps. LCSW gave Izaac the name of [facility] in [place], [place] as agency that sometimes sales portable aluminum ramps.  Collier or spouse Otila Kluver to contact that agency or other medical supply agencies to determine price of portable ramp. Jayse said his bedroom at home is on second floor. Discussion with Gabriele about setting up bed on first floor, perhaps in living room to allow him to sleep there when he is first discharged back home. Toron and spouse to call DME providers to ask about cost of rental of hospital bed for client. LCSW discussed with [place] HHPT, HHOT, HHNursing support . Encouraged Jeneen Rinks to talk with facility SW or RN discharge planner about in home support services available for client. Provided counseling support Provided Jeneen Rinks  with name and phone number of LCSW         SDOH assessments and interventions completed:  Yes  SDOH Interventions Today     Flowsheet Row Most Recent Value  SDOH Interventions   Physical Activity Interventions Other (Comments)  [needs wheelchair to ambulate]  Stress Interventions Provide Counseling  [stress related to managing medical needs]        Care Coordination Interventions Activated:  Yes  Care Coordination Interventions:  Yes, provided   Follow up plan: Follow up call scheduled for LCSW and client on 07/14/22 at 9:30 AM    Encounter Outcome:  Pt. Visit Completed

## 2022-06-23 DIAGNOSIS — N186 End stage renal disease: Secondary | ICD-10-CM | POA: Diagnosis not present

## 2022-06-23 DIAGNOSIS — Z992 Dependence on renal dialysis: Secondary | ICD-10-CM | POA: Diagnosis not present

## 2022-06-23 DIAGNOSIS — D631 Anemia in chronic kidney disease: Secondary | ICD-10-CM | POA: Diagnosis not present

## 2022-06-23 DIAGNOSIS — N2581 Secondary hyperparathyroidism of renal origin: Secondary | ICD-10-CM | POA: Diagnosis not present

## 2022-06-25 DIAGNOSIS — R9431 Abnormal electrocardiogram [ECG] [EKG]: Secondary | ICD-10-CM | POA: Diagnosis not present

## 2022-06-25 DIAGNOSIS — N2581 Secondary hyperparathyroidism of renal origin: Secondary | ICD-10-CM | POA: Diagnosis not present

## 2022-06-25 DIAGNOSIS — Z9889 Other specified postprocedural states: Secondary | ICD-10-CM | POA: Diagnosis not present

## 2022-06-25 DIAGNOSIS — Z89512 Acquired absence of left leg below knee: Secondary | ICD-10-CM | POA: Diagnosis not present

## 2022-06-25 DIAGNOSIS — Z992 Dependence on renal dialysis: Secondary | ICD-10-CM | POA: Diagnosis not present

## 2022-06-25 DIAGNOSIS — D631 Anemia in chronic kidney disease: Secondary | ICD-10-CM | POA: Diagnosis not present

## 2022-06-25 DIAGNOSIS — I493 Ventricular premature depolarization: Secondary | ICD-10-CM | POA: Diagnosis not present

## 2022-06-25 DIAGNOSIS — N186 End stage renal disease: Secondary | ICD-10-CM | POA: Diagnosis not present

## 2022-06-27 DIAGNOSIS — N186 End stage renal disease: Secondary | ICD-10-CM | POA: Diagnosis not present

## 2022-06-27 DIAGNOSIS — N2581 Secondary hyperparathyroidism of renal origin: Secondary | ICD-10-CM | POA: Diagnosis not present

## 2022-06-27 DIAGNOSIS — Z992 Dependence on renal dialysis: Secondary | ICD-10-CM | POA: Diagnosis not present

## 2022-06-27 DIAGNOSIS — D631 Anemia in chronic kidney disease: Secondary | ICD-10-CM | POA: Diagnosis not present

## 2022-06-30 DIAGNOSIS — Z992 Dependence on renal dialysis: Secondary | ICD-10-CM | POA: Diagnosis not present

## 2022-06-30 DIAGNOSIS — D631 Anemia in chronic kidney disease: Secondary | ICD-10-CM | POA: Diagnosis not present

## 2022-06-30 DIAGNOSIS — N186 End stage renal disease: Secondary | ICD-10-CM | POA: Diagnosis not present

## 2022-06-30 DIAGNOSIS — N2581 Secondary hyperparathyroidism of renal origin: Secondary | ICD-10-CM | POA: Diagnosis not present

## 2022-07-01 DIAGNOSIS — Z89512 Acquired absence of left leg below knee: Secondary | ICD-10-CM | POA: Diagnosis not present

## 2022-07-01 DIAGNOSIS — R52 Pain, unspecified: Secondary | ICD-10-CM | POA: Diagnosis not present

## 2022-07-01 DIAGNOSIS — S88112A Complete traumatic amputation at level between knee and ankle, left lower leg, initial encounter: Secondary | ICD-10-CM | POA: Diagnosis not present

## 2022-07-02 DIAGNOSIS — I132 Hypertensive heart and chronic kidney disease with heart failure and with stage 5 chronic kidney disease, or end stage renal disease: Secondary | ICD-10-CM | POA: Diagnosis not present

## 2022-07-02 DIAGNOSIS — T8781 Dehiscence of amputation stump: Secondary | ICD-10-CM | POA: Diagnosis not present

## 2022-07-02 DIAGNOSIS — E1122 Type 2 diabetes mellitus with diabetic chronic kidney disease: Secondary | ICD-10-CM | POA: Diagnosis not present

## 2022-07-02 DIAGNOSIS — D631 Anemia in chronic kidney disease: Secondary | ICD-10-CM | POA: Diagnosis not present

## 2022-07-02 DIAGNOSIS — Z955 Presence of coronary angioplasty implant and graft: Secondary | ICD-10-CM | POA: Diagnosis not present

## 2022-07-02 DIAGNOSIS — I739 Peripheral vascular disease, unspecified: Secondary | ICD-10-CM | POA: Diagnosis not present

## 2022-07-02 DIAGNOSIS — I2510 Atherosclerotic heart disease of native coronary artery without angina pectoris: Secondary | ICD-10-CM | POA: Diagnosis not present

## 2022-07-02 DIAGNOSIS — T8789 Other complications of amputation stump: Secondary | ICD-10-CM | POA: Diagnosis not present

## 2022-07-02 DIAGNOSIS — Z89512 Acquired absence of left leg below knee: Secondary | ICD-10-CM | POA: Diagnosis not present

## 2022-07-02 DIAGNOSIS — N186 End stage renal disease: Secondary | ICD-10-CM | POA: Diagnosis not present

## 2022-07-02 DIAGNOSIS — I5020 Unspecified systolic (congestive) heart failure: Secondary | ICD-10-CM | POA: Diagnosis not present

## 2022-07-02 DIAGNOSIS — T8189XA Other complications of procedures, not elsewhere classified, initial encounter: Secondary | ICD-10-CM | POA: Diagnosis not present

## 2022-07-02 DIAGNOSIS — Z7902 Long term (current) use of antithrombotics/antiplatelets: Secondary | ICD-10-CM | POA: Diagnosis not present

## 2022-07-02 DIAGNOSIS — E039 Hypothyroidism, unspecified: Secondary | ICD-10-CM | POA: Diagnosis not present

## 2022-07-02 DIAGNOSIS — Z7982 Long term (current) use of aspirin: Secondary | ICD-10-CM | POA: Diagnosis not present

## 2022-07-02 DIAGNOSIS — Z992 Dependence on renal dialysis: Secondary | ICD-10-CM | POA: Diagnosis not present

## 2022-07-02 DIAGNOSIS — Z7901 Long term (current) use of anticoagulants: Secondary | ICD-10-CM | POA: Diagnosis not present

## 2022-07-02 DIAGNOSIS — W19XXXA Unspecified fall, initial encounter: Secondary | ICD-10-CM | POA: Diagnosis not present

## 2022-07-02 DIAGNOSIS — Y9289 Other specified places as the place of occurrence of the external cause: Secondary | ICD-10-CM | POA: Diagnosis not present

## 2022-07-04 DIAGNOSIS — N186 End stage renal disease: Secondary | ICD-10-CM | POA: Diagnosis not present

## 2022-07-04 DIAGNOSIS — Z992 Dependence on renal dialysis: Secondary | ICD-10-CM | POA: Diagnosis not present

## 2022-07-04 DIAGNOSIS — N2581 Secondary hyperparathyroidism of renal origin: Secondary | ICD-10-CM | POA: Diagnosis not present

## 2022-07-04 DIAGNOSIS — D631 Anemia in chronic kidney disease: Secondary | ICD-10-CM | POA: Diagnosis not present

## 2022-07-07 DIAGNOSIS — D631 Anemia in chronic kidney disease: Secondary | ICD-10-CM | POA: Diagnosis not present

## 2022-07-07 DIAGNOSIS — Z992 Dependence on renal dialysis: Secondary | ICD-10-CM | POA: Diagnosis not present

## 2022-07-07 DIAGNOSIS — N2581 Secondary hyperparathyroidism of renal origin: Secondary | ICD-10-CM | POA: Diagnosis not present

## 2022-07-07 DIAGNOSIS — N186 End stage renal disease: Secondary | ICD-10-CM | POA: Diagnosis not present

## 2022-07-08 DIAGNOSIS — Z89512 Acquired absence of left leg below knee: Secondary | ICD-10-CM | POA: Diagnosis not present

## 2022-07-08 DIAGNOSIS — Z4889 Encounter for other specified surgical aftercare: Secondary | ICD-10-CM | POA: Diagnosis not present

## 2022-07-10 DIAGNOSIS — S88112A Complete traumatic amputation at level between knee and ankle, left lower leg, initial encounter: Secondary | ICD-10-CM | POA: Diagnosis not present

## 2022-07-10 DIAGNOSIS — R52 Pain, unspecified: Secondary | ICD-10-CM | POA: Diagnosis not present

## 2022-07-10 DIAGNOSIS — N186 End stage renal disease: Secondary | ICD-10-CM | POA: Diagnosis not present

## 2022-07-10 DIAGNOSIS — Z89431 Acquired absence of right foot: Secondary | ICD-10-CM | POA: Diagnosis not present

## 2022-07-10 DIAGNOSIS — I10 Essential (primary) hypertension: Secondary | ICD-10-CM | POA: Diagnosis not present

## 2022-07-10 DIAGNOSIS — I5020 Unspecified systolic (congestive) heart failure: Secondary | ICD-10-CM | POA: Diagnosis not present

## 2022-07-10 DIAGNOSIS — I2510 Atherosclerotic heart disease of native coronary artery without angina pectoris: Secondary | ICD-10-CM | POA: Diagnosis not present

## 2022-07-10 DIAGNOSIS — I739 Peripheral vascular disease, unspecified: Secondary | ICD-10-CM | POA: Diagnosis not present

## 2022-07-10 DIAGNOSIS — Z992 Dependence on renal dialysis: Secondary | ICD-10-CM | POA: Diagnosis not present

## 2022-07-10 DIAGNOSIS — G4700 Insomnia, unspecified: Secondary | ICD-10-CM | POA: Diagnosis not present

## 2022-07-10 DIAGNOSIS — J301 Allergic rhinitis due to pollen: Secondary | ICD-10-CM | POA: Diagnosis not present

## 2022-07-11 DIAGNOSIS — D631 Anemia in chronic kidney disease: Secondary | ICD-10-CM | POA: Diagnosis not present

## 2022-07-11 DIAGNOSIS — N186 End stage renal disease: Secondary | ICD-10-CM | POA: Diagnosis not present

## 2022-07-11 DIAGNOSIS — N2581 Secondary hyperparathyroidism of renal origin: Secondary | ICD-10-CM | POA: Diagnosis not present

## 2022-07-11 DIAGNOSIS — Z992 Dependence on renal dialysis: Secondary | ICD-10-CM | POA: Diagnosis not present

## 2022-07-13 DIAGNOSIS — I70201 Unspecified atherosclerosis of native arteries of extremities, right leg: Secondary | ICD-10-CM | POA: Diagnosis not present

## 2022-07-13 DIAGNOSIS — I350 Nonrheumatic aortic (valve) stenosis: Secondary | ICD-10-CM | POA: Diagnosis not present

## 2022-07-13 DIAGNOSIS — Z955 Presence of coronary angioplasty implant and graft: Secondary | ICD-10-CM | POA: Diagnosis not present

## 2022-07-13 DIAGNOSIS — E1140 Type 2 diabetes mellitus with diabetic neuropathy, unspecified: Secondary | ICD-10-CM | POA: Diagnosis not present

## 2022-07-13 DIAGNOSIS — Z89431 Acquired absence of right foot: Secondary | ICD-10-CM | POA: Diagnosis not present

## 2022-07-13 DIAGNOSIS — R569 Unspecified convulsions: Secondary | ICD-10-CM | POA: Diagnosis not present

## 2022-07-13 DIAGNOSIS — I252 Old myocardial infarction: Secondary | ICD-10-CM | POA: Diagnosis not present

## 2022-07-13 DIAGNOSIS — E1151 Type 2 diabetes mellitus with diabetic peripheral angiopathy without gangrene: Secondary | ICD-10-CM | POA: Diagnosis not present

## 2022-07-13 DIAGNOSIS — Z89512 Acquired absence of left leg below knee: Secondary | ICD-10-CM | POA: Diagnosis not present

## 2022-07-13 DIAGNOSIS — E782 Mixed hyperlipidemia: Secondary | ICD-10-CM | POA: Diagnosis not present

## 2022-07-13 DIAGNOSIS — I272 Pulmonary hypertension, unspecified: Secondary | ICD-10-CM | POA: Diagnosis not present

## 2022-07-13 DIAGNOSIS — E039 Hypothyroidism, unspecified: Secondary | ICD-10-CM | POA: Diagnosis not present

## 2022-07-13 DIAGNOSIS — D631 Anemia in chronic kidney disease: Secondary | ICD-10-CM | POA: Diagnosis not present

## 2022-07-13 DIAGNOSIS — Z4801 Encounter for change or removal of surgical wound dressing: Secondary | ICD-10-CM | POA: Diagnosis not present

## 2022-07-13 DIAGNOSIS — N186 End stage renal disease: Secondary | ICD-10-CM | POA: Diagnosis not present

## 2022-07-13 DIAGNOSIS — I5020 Unspecified systolic (congestive) heart failure: Secondary | ICD-10-CM | POA: Diagnosis not present

## 2022-07-13 DIAGNOSIS — Z992 Dependence on renal dialysis: Secondary | ICD-10-CM | POA: Diagnosis not present

## 2022-07-13 DIAGNOSIS — I132 Hypertensive heart and chronic kidney disease with heart failure and with stage 5 chronic kidney disease, or end stage renal disease: Secondary | ICD-10-CM | POA: Diagnosis not present

## 2022-07-13 DIAGNOSIS — Z4781 Encounter for orthopedic aftercare following surgical amputation: Secondary | ICD-10-CM | POA: Diagnosis not present

## 2022-07-13 DIAGNOSIS — E440 Moderate protein-calorie malnutrition: Secondary | ICD-10-CM | POA: Diagnosis not present

## 2022-07-13 DIAGNOSIS — I25119 Atherosclerotic heart disease of native coronary artery with unspecified angina pectoris: Secondary | ICD-10-CM | POA: Diagnosis not present

## 2022-07-13 DIAGNOSIS — Z9181 History of falling: Secondary | ICD-10-CM | POA: Diagnosis not present

## 2022-07-13 DIAGNOSIS — E1122 Type 2 diabetes mellitus with diabetic chronic kidney disease: Secondary | ICD-10-CM | POA: Diagnosis not present

## 2022-07-13 DIAGNOSIS — Z79899 Other long term (current) drug therapy: Secondary | ICD-10-CM | POA: Diagnosis not present

## 2022-07-14 ENCOUNTER — Ambulatory Visit: Payer: Self-pay | Admitting: Licensed Clinical Social Worker

## 2022-07-14 DIAGNOSIS — Z992 Dependence on renal dialysis: Secondary | ICD-10-CM | POA: Diagnosis not present

## 2022-07-14 DIAGNOSIS — Z5181 Encounter for therapeutic drug level monitoring: Secondary | ICD-10-CM | POA: Diagnosis not present

## 2022-07-14 DIAGNOSIS — L02612 Cutaneous abscess of left foot: Secondary | ICD-10-CM | POA: Diagnosis not present

## 2022-07-14 DIAGNOSIS — N2581 Secondary hyperparathyroidism of renal origin: Secondary | ICD-10-CM | POA: Diagnosis not present

## 2022-07-14 DIAGNOSIS — D631 Anemia in chronic kidney disease: Secondary | ICD-10-CM | POA: Diagnosis not present

## 2022-07-14 DIAGNOSIS — N186 End stage renal disease: Secondary | ICD-10-CM | POA: Diagnosis not present

## 2022-07-14 DIAGNOSIS — I96 Gangrene, not elsewhere classified: Secondary | ICD-10-CM | POA: Diagnosis not present

## 2022-07-14 NOTE — Patient Outreach (Signed)
  Care Coordination   Follow Up Visit Note   07/14/2022 Name: MERION GRIMALDO MRN: [id number] DOB: 02/17/1966  Valla Leaver is a 56 y.o. year old male who sees [name], MD for primary care. I spoke with  Valla Leaver by phone today.  What matters to the patients health and wellness today? Client wants to be able to access going in and out of his home; client wants to be able to complete daily ADLs    Goals Addressed               This Visit's Progress     Patient will be able to get in and out of his home and get up and down stairs (pt-stated)        Care Coordination Interventions:  Active listening used Coping skills discussed Current medical challenges discussed Discussed client home access. Client reported that relatives built him a ramp at his home. He has access now to go in and out of his home via ramp. Also he said his wife had installed a step lift in home so he can go from first to second floor of home and access his bedroom on second floor Reviewed dialysis treatments. He said he receives dialysis treatments 3 X weekly on Mondays, Wednesday and Fridays Provided counseling support. He is glad to be back home at is adjusting well to being at home.     SDOH assessments and interventions completed:  Yes    Care Coordination Interventions Activated:  No  Care Coordination Interventions:  No, not indicated   Follow up plan: Follow up call scheduled for 08/05/22 at 1:00 PM    Encounter Outcome:  Pt. Visit Completed

## 2022-07-14 NOTE — Patient Instructions (Addendum)
Visit Information  Thank you for taking time to visit with me today. Please don't hesitate to contact me if I can be of assistance to you before our next scheduled telephone appointment.  Following are the goals we discussed today:   Our next appointment is by telephone on 08/05/22 at 1:00 PM   Please call the care guide team at [phone] if you need to cancel or reschedule your appointment.   If you are experiencing a Mental Health or [place] or need someone to talk to, please go to [facility] [place] [phone])   Following is a copy of your full plan of care:   Care Coordination Interventions:  Active listening used Coping skills discussed Current medical challenges discussed Discussed client home access. Client reported that relatives built him a ramp at his home. He has access now to go in and out of his home via ramp. Also he said his wife had installed a step lift in home so he can go from first to second floor of home and access his bedroom on second floor Reviewed dialysis treatments. He said he receives dialysis treatments 3 X weekly on Mondays, Wednesday and Fridays Provided counseling support. He is glad to be back home at is adjusting well to being at home.  [name] was given information about Care Management services by the embedded care coordination team including:  Care Management services include personalized support from designated clinical staff supervised by his physician, including individualized plan of care and coordination with other care providers 24/7 contact phone numbers for assistance for urgent and routine care needs. The patient may stop CCM services at any time (effective at the end of the month) by phone call to the office staff.  Patient agreed to services and verbal consent obtained.   Norva Riffle.Olly Shiner MSW, [place] Holiday representative [facility] Care  Management [phone]

## 2022-07-15 DIAGNOSIS — Z4889 Encounter for other specified surgical aftercare: Secondary | ICD-10-CM | POA: Diagnosis not present

## 2022-07-15 DIAGNOSIS — Z89512 Acquired absence of left leg below knee: Secondary | ICD-10-CM | POA: Diagnosis not present

## 2022-07-16 DIAGNOSIS — E1122 Type 2 diabetes mellitus with diabetic chronic kidney disease: Secondary | ICD-10-CM | POA: Diagnosis not present

## 2022-07-16 DIAGNOSIS — I132 Hypertensive heart and chronic kidney disease with heart failure and with stage 5 chronic kidney disease, or end stage renal disease: Secondary | ICD-10-CM | POA: Diagnosis not present

## 2022-07-16 DIAGNOSIS — N186 End stage renal disease: Secondary | ICD-10-CM | POA: Diagnosis not present

## 2022-07-16 DIAGNOSIS — N2581 Secondary hyperparathyroidism of renal origin: Secondary | ICD-10-CM | POA: Diagnosis not present

## 2022-07-16 DIAGNOSIS — Z4781 Encounter for orthopedic aftercare following surgical amputation: Secondary | ICD-10-CM | POA: Diagnosis not present

## 2022-07-16 DIAGNOSIS — I96 Gangrene, not elsewhere classified: Secondary | ICD-10-CM | POA: Diagnosis not present

## 2022-07-16 DIAGNOSIS — I5020 Unspecified systolic (congestive) heart failure: Secondary | ICD-10-CM | POA: Diagnosis not present

## 2022-07-16 DIAGNOSIS — Z992 Dependence on renal dialysis: Secondary | ICD-10-CM | POA: Diagnosis not present

## 2022-07-16 DIAGNOSIS — Z4801 Encounter for change or removal of surgical wound dressing: Secondary | ICD-10-CM | POA: Diagnosis not present

## 2022-07-16 DIAGNOSIS — D631 Anemia in chronic kidney disease: Secondary | ICD-10-CM | POA: Diagnosis not present

## 2022-07-17 DIAGNOSIS — Z4781 Encounter for orthopedic aftercare following surgical amputation: Secondary | ICD-10-CM | POA: Diagnosis not present

## 2022-07-17 DIAGNOSIS — I5020 Unspecified systolic (congestive) heart failure: Secondary | ICD-10-CM | POA: Diagnosis not present

## 2022-07-17 DIAGNOSIS — N186 End stage renal disease: Secondary | ICD-10-CM | POA: Diagnosis not present

## 2022-07-17 DIAGNOSIS — I132 Hypertensive heart and chronic kidney disease with heart failure and with stage 5 chronic kidney disease, or end stage renal disease: Secondary | ICD-10-CM | POA: Diagnosis not present

## 2022-07-17 DIAGNOSIS — E1122 Type 2 diabetes mellitus with diabetic chronic kidney disease: Secondary | ICD-10-CM | POA: Diagnosis not present

## 2022-07-17 DIAGNOSIS — Z4801 Encounter for change or removal of surgical wound dressing: Secondary | ICD-10-CM | POA: Diagnosis not present

## 2022-07-18 DIAGNOSIS — T8781 Dehiscence of amputation stump: Secondary | ICD-10-CM | POA: Diagnosis not present

## 2022-07-18 DIAGNOSIS — Z0389 Encounter for observation for other suspected diseases and conditions ruled out: Secondary | ICD-10-CM | POA: Diagnosis not present

## 2022-07-18 DIAGNOSIS — G8918 Other acute postprocedural pain: Secondary | ICD-10-CM | POA: Diagnosis not present

## 2022-07-18 DIAGNOSIS — Z7982 Long term (current) use of aspirin: Secondary | ICD-10-CM | POA: Diagnosis not present

## 2022-07-18 DIAGNOSIS — E872 Acidosis, unspecified: Secondary | ICD-10-CM | POA: Diagnosis not present

## 2022-07-18 DIAGNOSIS — D631 Anemia in chronic kidney disease: Secondary | ICD-10-CM | POA: Diagnosis not present

## 2022-07-18 DIAGNOSIS — R4182 Altered mental status, unspecified: Secondary | ICD-10-CM | POA: Diagnosis not present

## 2022-07-18 DIAGNOSIS — Z992 Dependence on renal dialysis: Secondary | ICD-10-CM | POA: Diagnosis not present

## 2022-07-18 DIAGNOSIS — L0291 Cutaneous abscess, unspecified: Secondary | ICD-10-CM | POA: Diagnosis not present

## 2022-07-18 DIAGNOSIS — I959 Hypotension, unspecified: Secondary | ICD-10-CM | POA: Diagnosis not present

## 2022-07-18 DIAGNOSIS — Z89512 Acquired absence of left leg below knee: Secondary | ICD-10-CM | POA: Diagnosis not present

## 2022-07-18 DIAGNOSIS — I2510 Atherosclerotic heart disease of native coronary artery without angina pectoris: Secondary | ICD-10-CM | POA: Diagnosis not present

## 2022-07-18 DIAGNOSIS — Z79899 Other long term (current) drug therapy: Secondary | ICD-10-CM | POA: Diagnosis not present

## 2022-07-18 DIAGNOSIS — M869 Osteomyelitis, unspecified: Secondary | ICD-10-CM | POA: Diagnosis not present

## 2022-07-18 DIAGNOSIS — M7989 Other specified soft tissue disorders: Secondary | ICD-10-CM | POA: Diagnosis not present

## 2022-07-18 DIAGNOSIS — R079 Chest pain, unspecified: Secondary | ICD-10-CM | POA: Diagnosis not present

## 2022-07-18 DIAGNOSIS — E8809 Other disorders of plasma-protein metabolism, not elsewhere classified: Secondary | ICD-10-CM | POA: Diagnosis not present

## 2022-07-18 DIAGNOSIS — B950 Streptococcus, group A, as the cause of diseases classified elsewhere: Secondary | ICD-10-CM | POA: Diagnosis not present

## 2022-07-18 DIAGNOSIS — A419 Sepsis, unspecified organism: Secondary | ICD-10-CM | POA: Diagnosis not present

## 2022-07-18 DIAGNOSIS — L97929 Non-pressure chronic ulcer of unspecified part of left lower leg with unspecified severity: Secondary | ICD-10-CM | POA: Diagnosis not present

## 2022-07-18 DIAGNOSIS — T8754 Necrosis of amputation stump, left lower extremity: Secondary | ICD-10-CM | POA: Diagnosis not present

## 2022-07-18 DIAGNOSIS — E1129 Type 2 diabetes mellitus with other diabetic kidney complication: Secondary | ICD-10-CM | POA: Diagnosis not present

## 2022-07-18 DIAGNOSIS — R6521 Severe sepsis with septic shock: Secondary | ICD-10-CM | POA: Diagnosis not present

## 2022-07-18 DIAGNOSIS — Z833 Family history of diabetes mellitus: Secondary | ICD-10-CM | POA: Diagnosis not present

## 2022-07-18 DIAGNOSIS — Z7902 Long term (current) use of antithrombotics/antiplatelets: Secondary | ICD-10-CM | POA: Diagnosis not present

## 2022-07-18 DIAGNOSIS — I252 Old myocardial infarction: Secondary | ICD-10-CM | POA: Diagnosis not present

## 2022-07-18 DIAGNOSIS — M25462 Effusion, left knee: Secondary | ICD-10-CM | POA: Diagnosis not present

## 2022-07-18 DIAGNOSIS — N179 Acute kidney failure, unspecified: Secondary | ICD-10-CM | POA: Diagnosis not present

## 2022-07-18 DIAGNOSIS — E1141 Type 2 diabetes mellitus with diabetic mononeuropathy: Secondary | ICD-10-CM | POA: Diagnosis not present

## 2022-07-18 DIAGNOSIS — I5022 Chronic systolic (congestive) heart failure: Secondary | ICD-10-CM | POA: Diagnosis not present

## 2022-07-18 DIAGNOSIS — E871 Hypo-osmolality and hyponatremia: Secondary | ICD-10-CM | POA: Diagnosis not present

## 2022-07-18 DIAGNOSIS — N186 End stage renal disease: Secondary | ICD-10-CM | POA: Diagnosis not present

## 2022-07-18 DIAGNOSIS — A400 Sepsis due to streptococcus, group A: Secondary | ICD-10-CM | POA: Diagnosis not present

## 2022-07-18 DIAGNOSIS — E1122 Type 2 diabetes mellitus with diabetic chronic kidney disease: Secondary | ICD-10-CM | POA: Diagnosis not present

## 2022-07-18 DIAGNOSIS — N189 Chronic kidney disease, unspecified: Secondary | ICD-10-CM | POA: Diagnosis not present

## 2022-07-18 DIAGNOSIS — A4902 Methicillin resistant Staphylococcus aureus infection, unspecified site: Secondary | ICD-10-CM | POA: Diagnosis not present

## 2022-07-18 DIAGNOSIS — D649 Anemia, unspecified: Secondary | ICD-10-CM | POA: Diagnosis not present

## 2022-07-18 DIAGNOSIS — R7881 Bacteremia: Secondary | ICD-10-CM | POA: Diagnosis not present

## 2022-07-18 DIAGNOSIS — M79605 Pain in left leg: Secondary | ICD-10-CM | POA: Diagnosis not present

## 2022-07-18 DIAGNOSIS — Z885 Allergy status to narcotic agent status: Secondary | ICD-10-CM | POA: Diagnosis not present

## 2022-07-18 DIAGNOSIS — I110 Hypertensive heart disease with heart failure: Secondary | ICD-10-CM | POA: Diagnosis not present

## 2022-07-18 DIAGNOSIS — B955 Unspecified streptococcus as the cause of diseases classified elsewhere: Secondary | ICD-10-CM | POA: Diagnosis not present

## 2022-07-18 DIAGNOSIS — Z8614 Personal history of Methicillin resistant Staphylococcus aureus infection: Secondary | ICD-10-CM | POA: Diagnosis not present

## 2022-07-18 DIAGNOSIS — T8740 Infection of amputation stump, unspecified extremity: Secondary | ICD-10-CM | POA: Diagnosis not present

## 2022-07-18 DIAGNOSIS — I739 Peripheral vascular disease, unspecified: Secondary | ICD-10-CM | POA: Diagnosis not present

## 2022-07-18 DIAGNOSIS — G40909 Epilepsy, unspecified, not intractable, without status epilepticus: Secondary | ICD-10-CM | POA: Diagnosis not present

## 2022-07-18 DIAGNOSIS — Z89511 Acquired absence of right leg below knee: Secondary | ICD-10-CM | POA: Diagnosis not present

## 2022-07-18 DIAGNOSIS — I120 Hypertensive chronic kidney disease with stage 5 chronic kidney disease or end stage renal disease: Secondary | ICD-10-CM | POA: Diagnosis not present

## 2022-07-18 DIAGNOSIS — I272 Pulmonary hypertension, unspecified: Secondary | ICD-10-CM | POA: Diagnosis not present

## 2022-07-18 DIAGNOSIS — I509 Heart failure, unspecified: Secondary | ICD-10-CM | POA: Diagnosis not present

## 2022-07-18 DIAGNOSIS — B9562 Methicillin resistant Staphylococcus aureus infection as the cause of diseases classified elsewhere: Secondary | ICD-10-CM | POA: Diagnosis not present

## 2022-07-18 DIAGNOSIS — R918 Other nonspecific abnormal finding of lung field: Secondary | ICD-10-CM | POA: Diagnosis not present

## 2022-07-18 DIAGNOSIS — T8744 Infection of amputation stump, left lower extremity: Secondary | ICD-10-CM | POA: Diagnosis not present

## 2022-07-18 DIAGNOSIS — I132 Hypertensive heart and chronic kidney disease with heart failure and with stage 5 chronic kidney disease, or end stage renal disease: Secondary | ICD-10-CM | POA: Diagnosis not present

## 2022-07-20 DIAGNOSIS — Z992 Dependence on renal dialysis: Secondary | ICD-10-CM | POA: Diagnosis not present

## 2022-07-20 DIAGNOSIS — B955 Unspecified streptococcus as the cause of diseases classified elsewhere: Secondary | ICD-10-CM | POA: Insufficient documentation

## 2022-07-20 DIAGNOSIS — E1129 Type 2 diabetes mellitus with other diabetic kidney complication: Secondary | ICD-10-CM | POA: Diagnosis not present

## 2022-07-20 DIAGNOSIS — N186 End stage renal disease: Secondary | ICD-10-CM | POA: Diagnosis not present

## 2022-07-23 DIAGNOSIS — A4902 Methicillin resistant Staphylococcus aureus infection, unspecified site: Secondary | ICD-10-CM | POA: Insufficient documentation

## 2022-07-30 ENCOUNTER — Ambulatory Visit: Payer: Medicare Other | Admitting: Internal Medicine

## 2022-08-01 DIAGNOSIS — I96 Gangrene, not elsewhere classified: Secondary | ICD-10-CM | POA: Diagnosis not present

## 2022-08-01 DIAGNOSIS — N186 End stage renal disease: Secondary | ICD-10-CM | POA: Diagnosis not present

## 2022-08-01 DIAGNOSIS — E1129 Type 2 diabetes mellitus with other diabetic kidney complication: Secondary | ICD-10-CM | POA: Diagnosis not present

## 2022-08-01 DIAGNOSIS — L02612 Cutaneous abscess of left foot: Secondary | ICD-10-CM | POA: Diagnosis not present

## 2022-08-01 DIAGNOSIS — N2581 Secondary hyperparathyroidism of renal origin: Secondary | ICD-10-CM | POA: Diagnosis not present

## 2022-08-01 DIAGNOSIS — D631 Anemia in chronic kidney disease: Secondary | ICD-10-CM | POA: Diagnosis not present

## 2022-08-01 DIAGNOSIS — Z992 Dependence on renal dialysis: Secondary | ICD-10-CM | POA: Diagnosis not present

## 2022-08-01 DIAGNOSIS — Z5181 Encounter for therapeutic drug level monitoring: Secondary | ICD-10-CM | POA: Diagnosis not present

## 2022-08-02 DIAGNOSIS — E1122 Type 2 diabetes mellitus with diabetic chronic kidney disease: Secondary | ICD-10-CM | POA: Diagnosis not present

## 2022-08-02 DIAGNOSIS — N186 End stage renal disease: Secondary | ICD-10-CM | POA: Diagnosis not present

## 2022-08-02 DIAGNOSIS — I5020 Unspecified systolic (congestive) heart failure: Secondary | ICD-10-CM | POA: Diagnosis not present

## 2022-08-02 DIAGNOSIS — Z4781 Encounter for orthopedic aftercare following surgical amputation: Secondary | ICD-10-CM | POA: Diagnosis not present

## 2022-08-02 DIAGNOSIS — Z4801 Encounter for change or removal of surgical wound dressing: Secondary | ICD-10-CM | POA: Diagnosis not present

## 2022-08-02 DIAGNOSIS — I132 Hypertensive heart and chronic kidney disease with heart failure and with stage 5 chronic kidney disease, or end stage renal disease: Secondary | ICD-10-CM | POA: Diagnosis not present

## 2022-08-04 DIAGNOSIS — E1122 Type 2 diabetes mellitus with diabetic chronic kidney disease: Secondary | ICD-10-CM | POA: Diagnosis not present

## 2022-08-04 DIAGNOSIS — Z4801 Encounter for change or removal of surgical wound dressing: Secondary | ICD-10-CM | POA: Diagnosis not present

## 2022-08-04 DIAGNOSIS — D631 Anemia in chronic kidney disease: Secondary | ICD-10-CM | POA: Diagnosis not present

## 2022-08-04 DIAGNOSIS — Z992 Dependence on renal dialysis: Secondary | ICD-10-CM | POA: Diagnosis not present

## 2022-08-04 DIAGNOSIS — I132 Hypertensive heart and chronic kidney disease with heart failure and with stage 5 chronic kidney disease, or end stage renal disease: Secondary | ICD-10-CM | POA: Diagnosis not present

## 2022-08-04 DIAGNOSIS — I5020 Unspecified systolic (congestive) heart failure: Secondary | ICD-10-CM | POA: Diagnosis not present

## 2022-08-04 DIAGNOSIS — I96 Gangrene, not elsewhere classified: Secondary | ICD-10-CM | POA: Diagnosis not present

## 2022-08-04 DIAGNOSIS — Z4781 Encounter for orthopedic aftercare following surgical amputation: Secondary | ICD-10-CM | POA: Diagnosis not present

## 2022-08-04 DIAGNOSIS — N2581 Secondary hyperparathyroidism of renal origin: Secondary | ICD-10-CM | POA: Diagnosis not present

## 2022-08-04 DIAGNOSIS — N186 End stage renal disease: Secondary | ICD-10-CM | POA: Diagnosis not present

## 2022-08-05 ENCOUNTER — Ambulatory Visit: Payer: Self-pay | Admitting: Licensed Clinical Social Worker

## 2022-08-05 DIAGNOSIS — T8740 Infection of amputation stump, unspecified extremity: Secondary | ICD-10-CM | POA: Diagnosis not present

## 2022-08-05 NOTE — Patient Outreach (Signed)
  Care Coordination   Follow Up Visit Note   08/05/2022 Name: MEAD SLANE MRN: [id number] DOB: 1966-07-11  Valla Leaver is a 56 y.o. year old male who sees [name], MD for primary care. I spoke with  Valla Leaver by phone today.  What matters to the patients health and wellness today?  Client wants to be able to go in and out of his home and wants to be able to use stair lift to go up and down stairs in his home    Goals Addressed               This Visit's Progress     Patient will be able to get in and out of his home and get up and down stairs (pt-stated)        Care Coordination Interventions:  Active listening used Coping skills discussed Reviewed dialysis treatments. He said he receives dialysis treatments 3 X weekly on Mondays, Wednesday and Fridays Provided counseling support. He is glad to be back home at is adjusting well to being at home. Discussed ADLs completion. He said he uses wheelchair to ambulate. He uses walker to help him in bathroom. He has grab bars installed in bathroom to help him . Discussed wound care. He said he has wound care nurse that changes bandages on his wound each week as needed Discussed in home physical therapy and occupational therapy. He said both of these therapies are helpful to client Discussed uses of ramp to go in and out of home. Discussed use of stair lift to help him go up and down stairs Provided counseling support    SDOH assessments and interventions completed:  Yes  SDOH Interventions Today    Flowsheet Row Most Recent Value  SDOH Interventions   Depression Interventions/Treatment  Counseling  Physical Activity Interventions Other (Comments)  [uses a wheelchair to help him ambulate]  Stress Interventions Provide Counseling        Care Coordination Interventions Activated:  Yes  Care Coordination Interventions:  Yes, provided   Follow up plan: Follow up call scheduled for 08/26/22 at 9:30 AM   Encounter  Outcome:  Pt. Visit Completed

## 2022-08-05 NOTE — Patient Instructions (Signed)
Visit Information  Thank you for taking time to visit with me today. Please don't hesitate to contact me if I can be of assistance to you before our next scheduled telephone appointment.  Following are the goals we discussed today:   Our next appointment is by telephone on 08/26/22 at 9:30 AM   Please call the care guide team at [phone] if you need to cancel or reschedule your appointment.   If you are experiencing a Mental Health or [place] or need someone to talk to, please go to [facility] [place] [phone])   Following is a copy of your full plan of care:   Care Coordination Interventions:  Active listening used Coping skills discussed Reviewed dialysis treatments. He said he receives dialysis treatments 3 X weekly on Mondays, Wednesday and Fridays Provided counseling support. He is glad to be back home at is adjusting well to being at home. Discussed ADLs completion. He said he uses wheelchair to ambulate. He uses walker to help him in bathroom. He has grab bars installed in bathroom to help him . Discussed wound care. He said he has wound care nurse that changes bandages on his wound each week as needed Discussed in home physical therapy and occupational therapy. He said both of these therapies are helpful to client Discussed uses of ramp to go in and out of home. Discussed use of stair lift to help him go up and down stairs Provided counseling support  Mr. Lui was given information about Care Management services by the embedded care coordination team including:  Care Management services include personalized support from designated clinical staff supervised by his physician, including individualized plan of care and coordination with other care providers 24/7 contact phone numbers for assistance for urgent and routine care needs. The patient may stop CCM services at any time (effective at the end of  the month) by phone call to the office staff.  Patient agreed to services and verbal consent obtained.   Norva Riffle.Aubriana Ravelo MSW, [place] Holiday representative [facility] Care Management [phone]

## 2022-08-06 DIAGNOSIS — N2581 Secondary hyperparathyroidism of renal origin: Secondary | ICD-10-CM | POA: Diagnosis not present

## 2022-08-06 DIAGNOSIS — I96 Gangrene, not elsewhere classified: Secondary | ICD-10-CM | POA: Diagnosis not present

## 2022-08-06 DIAGNOSIS — E1122 Type 2 diabetes mellitus with diabetic chronic kidney disease: Secondary | ICD-10-CM | POA: Diagnosis not present

## 2022-08-06 DIAGNOSIS — D631 Anemia in chronic kidney disease: Secondary | ICD-10-CM | POA: Diagnosis not present

## 2022-08-06 DIAGNOSIS — N186 End stage renal disease: Secondary | ICD-10-CM | POA: Diagnosis not present

## 2022-08-06 DIAGNOSIS — Z4781 Encounter for orthopedic aftercare following surgical amputation: Secondary | ICD-10-CM | POA: Diagnosis not present

## 2022-08-06 DIAGNOSIS — I5020 Unspecified systolic (congestive) heart failure: Secondary | ICD-10-CM | POA: Diagnosis not present

## 2022-08-06 DIAGNOSIS — I132 Hypertensive heart and chronic kidney disease with heart failure and with stage 5 chronic kidney disease, or end stage renal disease: Secondary | ICD-10-CM | POA: Diagnosis not present

## 2022-08-06 DIAGNOSIS — Z992 Dependence on renal dialysis: Secondary | ICD-10-CM | POA: Diagnosis not present

## 2022-08-06 DIAGNOSIS — Z4801 Encounter for change or removal of surgical wound dressing: Secondary | ICD-10-CM | POA: Diagnosis not present

## 2022-08-07 DIAGNOSIS — I10 Essential (primary) hypertension: Secondary | ICD-10-CM | POA: Diagnosis not present

## 2022-08-07 DIAGNOSIS — J302 Other seasonal allergic rhinitis: Secondary | ICD-10-CM | POA: Diagnosis not present

## 2022-08-07 DIAGNOSIS — E1122 Type 2 diabetes mellitus with diabetic chronic kidney disease: Secondary | ICD-10-CM | POA: Diagnosis not present

## 2022-08-07 DIAGNOSIS — T8781 Dehiscence of amputation stump: Secondary | ICD-10-CM | POA: Diagnosis not present

## 2022-08-07 DIAGNOSIS — R7881 Bacteremia: Secondary | ICD-10-CM | POA: Diagnosis not present

## 2022-08-07 DIAGNOSIS — S88112D Complete traumatic amputation at level between knee and ankle, left lower leg, subsequent encounter: Secondary | ICD-10-CM | POA: Diagnosis not present

## 2022-08-08 DIAGNOSIS — Z4781 Encounter for orthopedic aftercare following surgical amputation: Secondary | ICD-10-CM | POA: Diagnosis not present

## 2022-08-08 DIAGNOSIS — I132 Hypertensive heart and chronic kidney disease with heart failure and with stage 5 chronic kidney disease, or end stage renal disease: Secondary | ICD-10-CM | POA: Diagnosis not present

## 2022-08-08 DIAGNOSIS — Z4801 Encounter for change or removal of surgical wound dressing: Secondary | ICD-10-CM | POA: Diagnosis not present

## 2022-08-08 DIAGNOSIS — N186 End stage renal disease: Secondary | ICD-10-CM | POA: Diagnosis not present

## 2022-08-08 DIAGNOSIS — I5020 Unspecified systolic (congestive) heart failure: Secondary | ICD-10-CM | POA: Diagnosis not present

## 2022-08-08 DIAGNOSIS — D631 Anemia in chronic kidney disease: Secondary | ICD-10-CM | POA: Diagnosis not present

## 2022-08-08 DIAGNOSIS — I96 Gangrene, not elsewhere classified: Secondary | ICD-10-CM | POA: Diagnosis not present

## 2022-08-08 DIAGNOSIS — E1122 Type 2 diabetes mellitus with diabetic chronic kidney disease: Secondary | ICD-10-CM | POA: Diagnosis not present

## 2022-08-08 DIAGNOSIS — N2581 Secondary hyperparathyroidism of renal origin: Secondary | ICD-10-CM | POA: Diagnosis not present

## 2022-08-08 DIAGNOSIS — Z992 Dependence on renal dialysis: Secondary | ICD-10-CM | POA: Diagnosis not present

## 2022-08-11 DIAGNOSIS — N186 End stage renal disease: Secondary | ICD-10-CM | POA: Diagnosis not present

## 2022-08-11 DIAGNOSIS — E1122 Type 2 diabetes mellitus with diabetic chronic kidney disease: Secondary | ICD-10-CM | POA: Diagnosis not present

## 2022-08-11 DIAGNOSIS — I132 Hypertensive heart and chronic kidney disease with heart failure and with stage 5 chronic kidney disease, or end stage renal disease: Secondary | ICD-10-CM | POA: Diagnosis not present

## 2022-08-11 DIAGNOSIS — Z4801 Encounter for change or removal of surgical wound dressing: Secondary | ICD-10-CM | POA: Diagnosis not present

## 2022-08-11 DIAGNOSIS — D631 Anemia in chronic kidney disease: Secondary | ICD-10-CM | POA: Diagnosis not present

## 2022-08-11 DIAGNOSIS — N2581 Secondary hyperparathyroidism of renal origin: Secondary | ICD-10-CM | POA: Diagnosis not present

## 2022-08-11 DIAGNOSIS — I96 Gangrene, not elsewhere classified: Secondary | ICD-10-CM | POA: Diagnosis not present

## 2022-08-11 DIAGNOSIS — Z992 Dependence on renal dialysis: Secondary | ICD-10-CM | POA: Diagnosis not present

## 2022-08-11 DIAGNOSIS — I5020 Unspecified systolic (congestive) heart failure: Secondary | ICD-10-CM | POA: Diagnosis not present

## 2022-08-11 DIAGNOSIS — Z4781 Encounter for orthopedic aftercare following surgical amputation: Secondary | ICD-10-CM | POA: Diagnosis not present

## 2022-08-12 DIAGNOSIS — Z955 Presence of coronary angioplasty implant and graft: Secondary | ICD-10-CM | POA: Diagnosis not present

## 2022-08-12 DIAGNOSIS — E1151 Type 2 diabetes mellitus with diabetic peripheral angiopathy without gangrene: Secondary | ICD-10-CM | POA: Diagnosis not present

## 2022-08-12 DIAGNOSIS — I70201 Unspecified atherosclerosis of native arteries of extremities, right leg: Secondary | ICD-10-CM | POA: Diagnosis not present

## 2022-08-12 DIAGNOSIS — Z89431 Acquired absence of right foot: Secondary | ICD-10-CM | POA: Diagnosis not present

## 2022-08-12 DIAGNOSIS — Z89512 Acquired absence of left leg below knee: Secondary | ICD-10-CM | POA: Diagnosis not present

## 2022-08-12 DIAGNOSIS — E1122 Type 2 diabetes mellitus with diabetic chronic kidney disease: Secondary | ICD-10-CM | POA: Diagnosis not present

## 2022-08-12 DIAGNOSIS — E440 Moderate protein-calorie malnutrition: Secondary | ICD-10-CM | POA: Diagnosis not present

## 2022-08-12 DIAGNOSIS — R569 Unspecified convulsions: Secondary | ICD-10-CM | POA: Diagnosis not present

## 2022-08-12 DIAGNOSIS — Z556 Problems related to health literacy: Secondary | ICD-10-CM | POA: Diagnosis not present

## 2022-08-12 DIAGNOSIS — I25119 Atherosclerotic heart disease of native coronary artery with unspecified angina pectoris: Secondary | ICD-10-CM | POA: Diagnosis not present

## 2022-08-12 DIAGNOSIS — Z992 Dependence on renal dialysis: Secondary | ICD-10-CM | POA: Diagnosis not present

## 2022-08-12 DIAGNOSIS — I5020 Unspecified systolic (congestive) heart failure: Secondary | ICD-10-CM | POA: Diagnosis not present

## 2022-08-12 DIAGNOSIS — A400 Sepsis due to streptococcus, group A: Secondary | ICD-10-CM | POA: Diagnosis not present

## 2022-08-12 DIAGNOSIS — Z79899 Other long term (current) drug therapy: Secondary | ICD-10-CM | POA: Diagnosis not present

## 2022-08-12 DIAGNOSIS — I132 Hypertensive heart and chronic kidney disease with heart failure and with stage 5 chronic kidney disease, or end stage renal disease: Secondary | ICD-10-CM | POA: Diagnosis not present

## 2022-08-12 DIAGNOSIS — E782 Mixed hyperlipidemia: Secondary | ICD-10-CM | POA: Diagnosis not present

## 2022-08-12 DIAGNOSIS — T8781 Dehiscence of amputation stump: Secondary | ICD-10-CM | POA: Diagnosis not present

## 2022-08-12 DIAGNOSIS — E039 Hypothyroidism, unspecified: Secondary | ICD-10-CM | POA: Diagnosis not present

## 2022-08-12 DIAGNOSIS — I272 Pulmonary hypertension, unspecified: Secondary | ICD-10-CM | POA: Diagnosis not present

## 2022-08-12 DIAGNOSIS — D631 Anemia in chronic kidney disease: Secondary | ICD-10-CM | POA: Diagnosis not present

## 2022-08-12 DIAGNOSIS — I252 Old myocardial infarction: Secondary | ICD-10-CM | POA: Diagnosis not present

## 2022-08-12 DIAGNOSIS — N186 End stage renal disease: Secondary | ICD-10-CM | POA: Diagnosis not present

## 2022-08-12 DIAGNOSIS — E1140 Type 2 diabetes mellitus with diabetic neuropathy, unspecified: Secondary | ICD-10-CM | POA: Diagnosis not present

## 2022-08-12 DIAGNOSIS — I959 Hypotension, unspecified: Secondary | ICD-10-CM | POA: Diagnosis not present

## 2022-08-12 DIAGNOSIS — I350 Nonrheumatic aortic (valve) stenosis: Secondary | ICD-10-CM | POA: Diagnosis not present

## 2022-08-13 DIAGNOSIS — I132 Hypertensive heart and chronic kidney disease with heart failure and with stage 5 chronic kidney disease, or end stage renal disease: Secondary | ICD-10-CM | POA: Diagnosis not present

## 2022-08-13 DIAGNOSIS — I96 Gangrene, not elsewhere classified: Secondary | ICD-10-CM | POA: Diagnosis not present

## 2022-08-13 DIAGNOSIS — D631 Anemia in chronic kidney disease: Secondary | ICD-10-CM | POA: Diagnosis not present

## 2022-08-13 DIAGNOSIS — N186 End stage renal disease: Secondary | ICD-10-CM | POA: Diagnosis not present

## 2022-08-13 DIAGNOSIS — A400 Sepsis due to streptococcus, group A: Secondary | ICD-10-CM | POA: Diagnosis not present

## 2022-08-13 DIAGNOSIS — Z992 Dependence on renal dialysis: Secondary | ICD-10-CM | POA: Diagnosis not present

## 2022-08-13 DIAGNOSIS — N2581 Secondary hyperparathyroidism of renal origin: Secondary | ICD-10-CM | POA: Diagnosis not present

## 2022-08-13 DIAGNOSIS — T8781 Dehiscence of amputation stump: Secondary | ICD-10-CM | POA: Diagnosis not present

## 2022-08-13 DIAGNOSIS — E1122 Type 2 diabetes mellitus with diabetic chronic kidney disease: Secondary | ICD-10-CM | POA: Diagnosis not present

## 2022-08-13 DIAGNOSIS — I959 Hypotension, unspecified: Secondary | ICD-10-CM | POA: Diagnosis not present

## 2022-08-13 DIAGNOSIS — I5020 Unspecified systolic (congestive) heart failure: Secondary | ICD-10-CM | POA: Diagnosis not present

## 2022-08-14 DIAGNOSIS — I959 Hypotension, unspecified: Secondary | ICD-10-CM | POA: Diagnosis not present

## 2022-08-14 DIAGNOSIS — I5020 Unspecified systolic (congestive) heart failure: Secondary | ICD-10-CM | POA: Diagnosis not present

## 2022-08-14 DIAGNOSIS — E1122 Type 2 diabetes mellitus with diabetic chronic kidney disease: Secondary | ICD-10-CM | POA: Diagnosis not present

## 2022-08-14 DIAGNOSIS — A400 Sepsis due to streptococcus, group A: Secondary | ICD-10-CM | POA: Diagnosis not present

## 2022-08-14 DIAGNOSIS — I132 Hypertensive heart and chronic kidney disease with heart failure and with stage 5 chronic kidney disease, or end stage renal disease: Secondary | ICD-10-CM | POA: Diagnosis not present

## 2022-08-14 DIAGNOSIS — T8781 Dehiscence of amputation stump: Secondary | ICD-10-CM | POA: Diagnosis not present

## 2022-08-15 DIAGNOSIS — I959 Hypotension, unspecified: Secondary | ICD-10-CM | POA: Diagnosis not present

## 2022-08-15 DIAGNOSIS — A400 Sepsis due to streptococcus, group A: Secondary | ICD-10-CM | POA: Diagnosis not present

## 2022-08-15 DIAGNOSIS — Z992 Dependence on renal dialysis: Secondary | ICD-10-CM | POA: Diagnosis not present

## 2022-08-15 DIAGNOSIS — I132 Hypertensive heart and chronic kidney disease with heart failure and with stage 5 chronic kidney disease, or end stage renal disease: Secondary | ICD-10-CM | POA: Diagnosis not present

## 2022-08-15 DIAGNOSIS — E1122 Type 2 diabetes mellitus with diabetic chronic kidney disease: Secondary | ICD-10-CM | POA: Diagnosis not present

## 2022-08-15 DIAGNOSIS — D631 Anemia in chronic kidney disease: Secondary | ICD-10-CM | POA: Diagnosis not present

## 2022-08-15 DIAGNOSIS — I5020 Unspecified systolic (congestive) heart failure: Secondary | ICD-10-CM | POA: Diagnosis not present

## 2022-08-15 DIAGNOSIS — I96 Gangrene, not elsewhere classified: Secondary | ICD-10-CM | POA: Diagnosis not present

## 2022-08-15 DIAGNOSIS — T8781 Dehiscence of amputation stump: Secondary | ICD-10-CM | POA: Diagnosis not present

## 2022-08-15 DIAGNOSIS — N2581 Secondary hyperparathyroidism of renal origin: Secondary | ICD-10-CM | POA: Diagnosis not present

## 2022-08-15 DIAGNOSIS — N186 End stage renal disease: Secondary | ICD-10-CM | POA: Diagnosis not present

## 2022-08-18 DIAGNOSIS — D631 Anemia in chronic kidney disease: Secondary | ICD-10-CM | POA: Diagnosis not present

## 2022-08-18 DIAGNOSIS — I132 Hypertensive heart and chronic kidney disease with heart failure and with stage 5 chronic kidney disease, or end stage renal disease: Secondary | ICD-10-CM | POA: Diagnosis not present

## 2022-08-18 DIAGNOSIS — I96 Gangrene, not elsewhere classified: Secondary | ICD-10-CM | POA: Diagnosis not present

## 2022-08-18 DIAGNOSIS — L02612 Cutaneous abscess of left foot: Secondary | ICD-10-CM | POA: Diagnosis not present

## 2022-08-18 DIAGNOSIS — Z5181 Encounter for therapeutic drug level monitoring: Secondary | ICD-10-CM | POA: Diagnosis not present

## 2022-08-18 DIAGNOSIS — N186 End stage renal disease: Secondary | ICD-10-CM | POA: Diagnosis not present

## 2022-08-18 DIAGNOSIS — I5020 Unspecified systolic (congestive) heart failure: Secondary | ICD-10-CM | POA: Diagnosis not present

## 2022-08-18 DIAGNOSIS — I959 Hypotension, unspecified: Secondary | ICD-10-CM | POA: Diagnosis not present

## 2022-08-18 DIAGNOSIS — E1122 Type 2 diabetes mellitus with diabetic chronic kidney disease: Secondary | ICD-10-CM | POA: Diagnosis not present

## 2022-08-18 DIAGNOSIS — N2581 Secondary hyperparathyroidism of renal origin: Secondary | ICD-10-CM | POA: Diagnosis not present

## 2022-08-18 DIAGNOSIS — A400 Sepsis due to streptococcus, group A: Secondary | ICD-10-CM | POA: Diagnosis not present

## 2022-08-18 DIAGNOSIS — T8781 Dehiscence of amputation stump: Secondary | ICD-10-CM | POA: Diagnosis not present

## 2022-08-18 DIAGNOSIS — Z992 Dependence on renal dialysis: Secondary | ICD-10-CM | POA: Diagnosis not present

## 2022-08-19 DIAGNOSIS — E1122 Type 2 diabetes mellitus with diabetic chronic kidney disease: Secondary | ICD-10-CM | POA: Diagnosis not present

## 2022-08-19 DIAGNOSIS — A400 Sepsis due to streptococcus, group A: Secondary | ICD-10-CM | POA: Diagnosis not present

## 2022-08-19 DIAGNOSIS — I132 Hypertensive heart and chronic kidney disease with heart failure and with stage 5 chronic kidney disease, or end stage renal disease: Secondary | ICD-10-CM | POA: Diagnosis not present

## 2022-08-19 DIAGNOSIS — I5020 Unspecified systolic (congestive) heart failure: Secondary | ICD-10-CM | POA: Diagnosis not present

## 2022-08-19 DIAGNOSIS — T8781 Dehiscence of amputation stump: Secondary | ICD-10-CM | POA: Diagnosis not present

## 2022-08-19 DIAGNOSIS — I959 Hypotension, unspecified: Secondary | ICD-10-CM | POA: Diagnosis not present

## 2022-08-20 DIAGNOSIS — E039 Hypothyroidism, unspecified: Secondary | ICD-10-CM | POA: Diagnosis not present

## 2022-08-20 DIAGNOSIS — I5022 Chronic systolic (congestive) heart failure: Secondary | ICD-10-CM | POA: Diagnosis not present

## 2022-08-20 DIAGNOSIS — D631 Anemia in chronic kidney disease: Secondary | ICD-10-CM | POA: Diagnosis not present

## 2022-08-20 DIAGNOSIS — E1129 Type 2 diabetes mellitus with other diabetic kidney complication: Secondary | ICD-10-CM | POA: Diagnosis not present

## 2022-08-20 DIAGNOSIS — T8781 Dehiscence of amputation stump: Secondary | ICD-10-CM | POA: Diagnosis not present

## 2022-08-20 DIAGNOSIS — I5020 Unspecified systolic (congestive) heart failure: Secondary | ICD-10-CM | POA: Diagnosis not present

## 2022-08-20 DIAGNOSIS — Z89512 Acquired absence of left leg below knee: Secondary | ICD-10-CM | POA: Diagnosis not present

## 2022-08-20 DIAGNOSIS — Z89511 Acquired absence of right leg below knee: Secondary | ICD-10-CM | POA: Diagnosis not present

## 2022-08-20 DIAGNOSIS — I132 Hypertensive heart and chronic kidney disease with heart failure and with stage 5 chronic kidney disease, or end stage renal disease: Secondary | ICD-10-CM | POA: Diagnosis not present

## 2022-08-20 DIAGNOSIS — I96 Gangrene, not elsewhere classified: Secondary | ICD-10-CM | POA: Diagnosis not present

## 2022-08-20 DIAGNOSIS — N2581 Secondary hyperparathyroidism of renal origin: Secondary | ICD-10-CM | POA: Diagnosis not present

## 2022-08-20 DIAGNOSIS — I959 Hypotension, unspecified: Secondary | ICD-10-CM | POA: Diagnosis not present

## 2022-08-20 DIAGNOSIS — I2510 Atherosclerotic heart disease of native coronary artery without angina pectoris: Secondary | ICD-10-CM | POA: Diagnosis not present

## 2022-08-20 DIAGNOSIS — N186 End stage renal disease: Secondary | ICD-10-CM | POA: Diagnosis not present

## 2022-08-20 DIAGNOSIS — Z1331 Encounter for screening for depression: Secondary | ICD-10-CM | POA: Diagnosis not present

## 2022-08-20 DIAGNOSIS — Z792 Long term (current) use of antibiotics: Secondary | ICD-10-CM | POA: Diagnosis not present

## 2022-08-20 DIAGNOSIS — Z992 Dependence on renal dialysis: Secondary | ICD-10-CM | POA: Diagnosis not present

## 2022-08-20 DIAGNOSIS — E1122 Type 2 diabetes mellitus with diabetic chronic kidney disease: Secondary | ICD-10-CM | POA: Diagnosis not present

## 2022-08-20 DIAGNOSIS — A400 Sepsis due to streptococcus, group A: Secondary | ICD-10-CM | POA: Diagnosis not present

## 2022-08-20 DIAGNOSIS — T8744 Infection of amputation stump, left lower extremity: Secondary | ICD-10-CM | POA: Diagnosis not present

## 2022-08-21 ENCOUNTER — Ambulatory Visit: Payer: Medicare Other | Admitting: Physician Assistant

## 2022-08-21 DIAGNOSIS — N186 End stage renal disease: Secondary | ICD-10-CM | POA: Diagnosis not present

## 2022-08-21 DIAGNOSIS — Z1331 Encounter for screening for depression: Secondary | ICD-10-CM | POA: Diagnosis not present

## 2022-08-21 DIAGNOSIS — E1122 Type 2 diabetes mellitus with diabetic chronic kidney disease: Secondary | ICD-10-CM | POA: Diagnosis not present

## 2022-08-21 DIAGNOSIS — I2510 Atherosclerotic heart disease of native coronary artery without angina pectoris: Secondary | ICD-10-CM | POA: Diagnosis not present

## 2022-08-21 DIAGNOSIS — I132 Hypertensive heart and chronic kidney disease with heart failure and with stage 5 chronic kidney disease, or end stage renal disease: Secondary | ICD-10-CM | POA: Diagnosis not present

## 2022-08-21 DIAGNOSIS — T8744 Infection of amputation stump, left lower extremity: Secondary | ICD-10-CM | POA: Diagnosis not present

## 2022-08-21 DIAGNOSIS — E039 Hypothyroidism, unspecified: Secondary | ICD-10-CM | POA: Diagnosis not present

## 2022-08-21 DIAGNOSIS — Z792 Long term (current) use of antibiotics: Secondary | ICD-10-CM | POA: Diagnosis not present

## 2022-08-21 DIAGNOSIS — Z89511 Acquired absence of right leg below knee: Secondary | ICD-10-CM | POA: Diagnosis not present

## 2022-08-21 DIAGNOSIS — Z992 Dependence on renal dialysis: Secondary | ICD-10-CM | POA: Diagnosis not present

## 2022-08-21 DIAGNOSIS — D631 Anemia in chronic kidney disease: Secondary | ICD-10-CM | POA: Diagnosis not present

## 2022-08-21 DIAGNOSIS — Z89512 Acquired absence of left leg below knee: Secondary | ICD-10-CM | POA: Diagnosis not present

## 2022-08-21 DIAGNOSIS — I5022 Chronic systolic (congestive) heart failure: Secondary | ICD-10-CM | POA: Diagnosis not present

## 2022-08-22 DIAGNOSIS — A400 Sepsis due to streptococcus, group A: Secondary | ICD-10-CM | POA: Diagnosis not present

## 2022-08-22 DIAGNOSIS — I5020 Unspecified systolic (congestive) heart failure: Secondary | ICD-10-CM | POA: Diagnosis not present

## 2022-08-22 DIAGNOSIS — E1122 Type 2 diabetes mellitus with diabetic chronic kidney disease: Secondary | ICD-10-CM | POA: Diagnosis not present

## 2022-08-22 DIAGNOSIS — I959 Hypotension, unspecified: Secondary | ICD-10-CM | POA: Diagnosis not present

## 2022-08-22 DIAGNOSIS — I132 Hypertensive heart and chronic kidney disease with heart failure and with stage 5 chronic kidney disease, or end stage renal disease: Secondary | ICD-10-CM | POA: Diagnosis not present

## 2022-08-22 DIAGNOSIS — T8781 Dehiscence of amputation stump: Secondary | ICD-10-CM | POA: Diagnosis not present

## 2022-08-25 DIAGNOSIS — I959 Hypotension, unspecified: Secondary | ICD-10-CM | POA: Diagnosis not present

## 2022-08-25 DIAGNOSIS — N2581 Secondary hyperparathyroidism of renal origin: Secondary | ICD-10-CM | POA: Diagnosis not present

## 2022-08-25 DIAGNOSIS — D631 Anemia in chronic kidney disease: Secondary | ICD-10-CM | POA: Diagnosis not present

## 2022-08-25 DIAGNOSIS — Z992 Dependence on renal dialysis: Secondary | ICD-10-CM | POA: Diagnosis not present

## 2022-08-25 DIAGNOSIS — I96 Gangrene, not elsewhere classified: Secondary | ICD-10-CM | POA: Diagnosis not present

## 2022-08-25 DIAGNOSIS — T8781 Dehiscence of amputation stump: Secondary | ICD-10-CM | POA: Diagnosis not present

## 2022-08-25 DIAGNOSIS — N186 End stage renal disease: Secondary | ICD-10-CM | POA: Diagnosis not present

## 2022-08-25 DIAGNOSIS — A400 Sepsis due to streptococcus, group A: Secondary | ICD-10-CM | POA: Diagnosis not present

## 2022-08-25 DIAGNOSIS — I5020 Unspecified systolic (congestive) heart failure: Secondary | ICD-10-CM | POA: Diagnosis not present

## 2022-08-25 DIAGNOSIS — I132 Hypertensive heart and chronic kidney disease with heart failure and with stage 5 chronic kidney disease, or end stage renal disease: Secondary | ICD-10-CM | POA: Diagnosis not present

## 2022-08-25 DIAGNOSIS — E1122 Type 2 diabetes mellitus with diabetic chronic kidney disease: Secondary | ICD-10-CM | POA: Diagnosis not present

## 2022-08-26 ENCOUNTER — Ambulatory Visit: Payer: Self-pay | Admitting: Licensed Clinical Social Worker

## 2022-08-26 DIAGNOSIS — A400 Sepsis due to streptococcus, group A: Secondary | ICD-10-CM | POA: Diagnosis not present

## 2022-08-26 DIAGNOSIS — I5020 Unspecified systolic (congestive) heart failure: Secondary | ICD-10-CM | POA: Diagnosis not present

## 2022-08-26 DIAGNOSIS — E1122 Type 2 diabetes mellitus with diabetic chronic kidney disease: Secondary | ICD-10-CM | POA: Diagnosis not present

## 2022-08-26 DIAGNOSIS — I132 Hypertensive heart and chronic kidney disease with heart failure and with stage 5 chronic kidney disease, or end stage renal disease: Secondary | ICD-10-CM | POA: Diagnosis not present

## 2022-08-26 DIAGNOSIS — I959 Hypotension, unspecified: Secondary | ICD-10-CM | POA: Diagnosis not present

## 2022-08-26 DIAGNOSIS — T8781 Dehiscence of amputation stump: Secondary | ICD-10-CM | POA: Diagnosis not present

## 2022-08-26 NOTE — Patient Instructions (Signed)
Visit Information  Thank you for taking time to visit with me today. Please don't hesitate to contact me if I can be of assistance to you before our next scheduled telephone appointment.  Following are the goals we discussed today:   Our next appointment is by telephone on 09/22/22 at 10:30 AM   Please call the care guide team at [phone] if you need to cancel or reschedule your appointment.   If you are experiencing a Mental Health or [place] or need someone to talk to, please go to [facility] [place] [phone])   Following is a copy of your full plan of care:   Care Coordination Interventions:  Active listening used Provided counseling support. He is glad to be back home at is adjusting well to being at home. Discussed ADLs completion.  Discussed wound care. He said he has wound care nurse that  visits him at his home 3 times weekly to care for wound area of client.  Discussed uses of ramp to go in and out of home. Discussed use of stair lift to help him go up and down stairs Discussed RN support services with program.   Discussed client plans for prosthetic device once wound area is healed for client  Mr. Spoon was given information about Care Management services by the embedded care coordination team including:  Care Management services include personalized support from designated clinical staff supervised by his physician, including individualized plan of care and coordination with other care providers 24/7 contact phone numbers for assistance for urgent and routine care needs. The patient may stop CCM services at any time (effective at the end of the month) by phone call to the office staff.  Patient agreed to services and verbal consent obtained.   Norva Riffle.Cayleen Benjamin MSW, [place] [employer] representative [facility] Care Management [phone]

## 2022-08-26 NOTE — Patient Outreach (Signed)
  Care Coordination   Follow Up Visit Note   08/26/2022 Name: [name] MRN: [id number] DOB: [birth year]  [name] is a 56 y.o. year old male who sees [name], MD for primary care. I spoke with  [name] by phone today.  What matters to the patients health and wellness today?  Client wants to be able to go in and out of his home with use of ramp and client wants to go up and down stairs at home via use of stair lift    Goals Addressed               This Visit's Progress     Patient will be able to get in and out of his home and get up and down stairs (pt-stated)        Care Coordination Interventions:  Active listening used Provided counseling support. He is glad to be back home at is adjusting well to being at home. Discussed ADLs completion.  Discussed wound care. He said he has wound care nurse that  visits him at his home 3 times weekly to care for wound area of client.  Discussed uses of ramp to go in and out of home. Discussed use of stair lift to help him go up and down stairs Discussed RN support services with program.   Discussed client plans for prosthetic device once wound area is healed for client     SDOH assessments and interventions completed:  Yes     Care Coordination Interventions Activated:  Yes  Care Coordination Interventions:  Yes, provided   Follow up plan: Follow up call scheduled for 09/22/22 at 10:30 AM    Encounter Outcome:  Pt. Visit Completed   Norva Riffle.Vivan Vanderveer MSW, [place] [employer] representative [facility] Care Management [phone]

## 2022-08-27 DIAGNOSIS — A400 Sepsis due to streptococcus, group A: Secondary | ICD-10-CM | POA: Diagnosis not present

## 2022-08-27 DIAGNOSIS — E1122 Type 2 diabetes mellitus with diabetic chronic kidney disease: Secondary | ICD-10-CM | POA: Diagnosis not present

## 2022-08-27 DIAGNOSIS — T8781 Dehiscence of amputation stump: Secondary | ICD-10-CM | POA: Diagnosis not present

## 2022-08-27 DIAGNOSIS — I132 Hypertensive heart and chronic kidney disease with heart failure and with stage 5 chronic kidney disease, or end stage renal disease: Secondary | ICD-10-CM | POA: Diagnosis not present

## 2022-08-27 DIAGNOSIS — I96 Gangrene, not elsewhere classified: Secondary | ICD-10-CM | POA: Diagnosis not present

## 2022-08-27 DIAGNOSIS — Z992 Dependence on renal dialysis: Secondary | ICD-10-CM | POA: Diagnosis not present

## 2022-08-27 DIAGNOSIS — N2581 Secondary hyperparathyroidism of renal origin: Secondary | ICD-10-CM | POA: Diagnosis not present

## 2022-08-27 DIAGNOSIS — D631 Anemia in chronic kidney disease: Secondary | ICD-10-CM | POA: Diagnosis not present

## 2022-08-27 DIAGNOSIS — N186 End stage renal disease: Secondary | ICD-10-CM | POA: Diagnosis not present

## 2022-08-27 DIAGNOSIS — I959 Hypotension, unspecified: Secondary | ICD-10-CM | POA: Diagnosis not present

## 2022-08-27 DIAGNOSIS — I5020 Unspecified systolic (congestive) heart failure: Secondary | ICD-10-CM | POA: Diagnosis not present

## 2022-08-28 ENCOUNTER — Encounter: Payer: Medicare Other | Attending: Physician Assistant | Admitting: Internal Medicine

## 2022-08-28 DIAGNOSIS — I110 Hypertensive heart disease with heart failure: Secondary | ICD-10-CM | POA: Insufficient documentation

## 2022-08-28 DIAGNOSIS — L03116 Cellulitis of left lower limb: Secondary | ICD-10-CM | POA: Diagnosis not present

## 2022-08-28 DIAGNOSIS — I509 Heart failure, unspecified: Secondary | ICD-10-CM | POA: Diagnosis not present

## 2022-08-28 DIAGNOSIS — E1151 Type 2 diabetes mellitus with diabetic peripheral angiopathy without gangrene: Secondary | ICD-10-CM | POA: Insufficient documentation

## 2022-08-28 DIAGNOSIS — G40909 Epilepsy, unspecified, not intractable, without status epilepticus: Secondary | ICD-10-CM | POA: Insufficient documentation

## 2022-08-28 DIAGNOSIS — L97828 Non-pressure chronic ulcer of other part of left lower leg with other specified severity: Secondary | ICD-10-CM | POA: Insufficient documentation

## 2022-08-28 DIAGNOSIS — Z992 Dependence on renal dialysis: Secondary | ICD-10-CM | POA: Diagnosis not present

## 2022-08-28 DIAGNOSIS — I2510 Atherosclerotic heart disease of native coronary artery without angina pectoris: Secondary | ICD-10-CM | POA: Diagnosis not present

## 2022-08-28 DIAGNOSIS — E1140 Type 2 diabetes mellitus with diabetic neuropathy, unspecified: Secondary | ICD-10-CM | POA: Diagnosis not present

## 2022-08-28 DIAGNOSIS — Z89512 Acquired absence of left leg below knee: Secondary | ICD-10-CM | POA: Diagnosis not present

## 2022-08-28 DIAGNOSIS — T8131XA Disruption of external operation (surgical) wound, not elsewhere classified, initial encounter: Secondary | ICD-10-CM | POA: Diagnosis not present

## 2022-08-29 DIAGNOSIS — I132 Hypertensive heart and chronic kidney disease with heart failure and with stage 5 chronic kidney disease, or end stage renal disease: Secondary | ICD-10-CM | POA: Diagnosis not present

## 2022-08-29 DIAGNOSIS — D631 Anemia in chronic kidney disease: Secondary | ICD-10-CM | POA: Diagnosis not present

## 2022-08-29 DIAGNOSIS — A400 Sepsis due to streptococcus, group A: Secondary | ICD-10-CM | POA: Diagnosis not present

## 2022-08-29 DIAGNOSIS — I959 Hypotension, unspecified: Secondary | ICD-10-CM | POA: Diagnosis not present

## 2022-08-29 DIAGNOSIS — I96 Gangrene, not elsewhere classified: Secondary | ICD-10-CM | POA: Diagnosis not present

## 2022-08-29 DIAGNOSIS — I5020 Unspecified systolic (congestive) heart failure: Secondary | ICD-10-CM | POA: Diagnosis not present

## 2022-08-29 DIAGNOSIS — E1122 Type 2 diabetes mellitus with diabetic chronic kidney disease: Secondary | ICD-10-CM | POA: Diagnosis not present

## 2022-08-29 DIAGNOSIS — N2581 Secondary hyperparathyroidism of renal origin: Secondary | ICD-10-CM | POA: Diagnosis not present

## 2022-08-29 DIAGNOSIS — T8781 Dehiscence of amputation stump: Secondary | ICD-10-CM | POA: Diagnosis not present

## 2022-08-29 DIAGNOSIS — Z992 Dependence on renal dialysis: Secondary | ICD-10-CM | POA: Diagnosis not present

## 2022-08-29 DIAGNOSIS — N186 End stage renal disease: Secondary | ICD-10-CM | POA: Diagnosis not present

## 2022-09-01 DIAGNOSIS — D631 Anemia in chronic kidney disease: Secondary | ICD-10-CM | POA: Diagnosis not present

## 2022-09-01 DIAGNOSIS — L02612 Cutaneous abscess of left foot: Secondary | ICD-10-CM | POA: Diagnosis not present

## 2022-09-01 DIAGNOSIS — E1122 Type 2 diabetes mellitus with diabetic chronic kidney disease: Secondary | ICD-10-CM | POA: Diagnosis not present

## 2022-09-01 DIAGNOSIS — T8781 Dehiscence of amputation stump: Secondary | ICD-10-CM | POA: Diagnosis not present

## 2022-09-01 DIAGNOSIS — N2581 Secondary hyperparathyroidism of renal origin: Secondary | ICD-10-CM | POA: Diagnosis not present

## 2022-09-01 DIAGNOSIS — I132 Hypertensive heart and chronic kidney disease with heart failure and with stage 5 chronic kidney disease, or end stage renal disease: Secondary | ICD-10-CM | POA: Diagnosis not present

## 2022-09-01 DIAGNOSIS — I959 Hypotension, unspecified: Secondary | ICD-10-CM | POA: Diagnosis not present

## 2022-09-01 DIAGNOSIS — I96 Gangrene, not elsewhere classified: Secondary | ICD-10-CM | POA: Diagnosis not present

## 2022-09-01 DIAGNOSIS — A400 Sepsis due to streptococcus, group A: Secondary | ICD-10-CM | POA: Diagnosis not present

## 2022-09-01 DIAGNOSIS — Z5181 Encounter for therapeutic drug level monitoring: Secondary | ICD-10-CM | POA: Diagnosis not present

## 2022-09-01 DIAGNOSIS — Z992 Dependence on renal dialysis: Secondary | ICD-10-CM | POA: Diagnosis not present

## 2022-09-01 DIAGNOSIS — I5020 Unspecified systolic (congestive) heart failure: Secondary | ICD-10-CM | POA: Diagnosis not present

## 2022-09-01 DIAGNOSIS — N186 End stage renal disease: Secondary | ICD-10-CM | POA: Diagnosis not present

## 2022-09-01 NOTE — Progress Notes (Signed)
[place], [name] L ([id number]) [id number] Page 1 of 5 Visit Report for 08/28/2022 Abuse Risk Screen Details Patient Name: Date of Service: [name] MES L. 08/28/2022 9:45 A M Medical Record Number: [id number] Patient Account Number: [id number] Date of Birth/Sex: Treating RN: 05-06-1966 (56 y.o. [name]) [name] Primary Care Elga Santy: [name] Other Clinician: Referring Jonovan Boedecker: Treating Langston [place]/Extender: RO BSO N, [place] EL [place] in Treatment: 0 Abuse Risk Screen Items Answer ABUSE RISK SCREEN: Has anyone close to you tried to hurt or harm you recentlyo No Do you feel uncomfortable with anyone in your familyo No Has anyone forced you do things that you didnt want to doo No Electronic Signature(s) Signed: 09/01/2022 10:26:37 AM By: [name] RN Entered By: [name] on 08/28/2022 09:38:46 -------------------------------------------------------------------------------- Activities of Daily Living Details Patient Name: Date of Service: [name] MES L. 08/28/2022 9:45 A M Medical Record Number: [id number] Patient Account Number: [id number] Date of Birth/Sex: Treating RN: 04-23-1966 (56 y.o. [name]) [name] Primary Care Maher Shon: [name] Other Clinician: Referring Pinkney Venard: Treating Marlea Gambill/Extender: RO BSO N, [place] EL [place] in Treatment: 0 Activities of Daily Living Items Answer Activities of Daily Living (Please select one for each item) Drive Automobile Not Able T Medications ake Completely Able Use T elephone Completely Able Care for Appearance Completely Able Use T oilet Completely Able Bath / Shower Completely Able Dress Self Completely Able Feed Self Completely Able Walk Not Able Get In / Out Bed Completely [place] Not Able BRANDONN, CAPELLI ([id number]) [phone] [id number] Page 2 of 5 Prepare Meals Need Assistance Handle Money Completely Able Shop for Self  Completely Able Electronic Signature(s) Signed: 09/01/2022 10:26:37 AM By: [name] RN Entered By: [name] on 08/28/2022 09:39:24 -------------------------------------------------------------------------------- Education Screening Details Patient Name: Date of Service: [name], [name] MES L. 08/28/2022 9:45 A M Medical Record Number: [id number] Patient Account Number: [id number] Date of Birth/Sex: Treating RN: February 02, 1966 (56 y.o. [name]) [name] Primary Care Tarshia Kot: [name] Other Clinician: Referring Ambrosia Wisnewski: Treating Aarthi Uyeno/Extender: RO BSO N, MICHA EL [place] in Treatment: 0 Primary Learner Assessed: Patient Learning Preferences/Education Level/Primary Language Learning Preference: Explanation Highest Education Level: College or Above Preferred Language: English Cognitive Barrier Language Barrier: No Translator Needed: No Memory Deficit: No Emotional Barrier: No Cultural/Religious Beliefs Affecting Medical Care: No Physical Barrier Impaired Vision: Yes Glasses Impaired Hearing: No Decreased Hand dexterity: No Knowledge/Comprehension Knowledge Level: Medium Comprehension Level: Medium Ability to understand written instructions: High Ability to understand verbal instructions: High Motivation Anxiety Level: Anxious Cooperation: Cooperative Education Importance: Acknowledges Need Interest in Health Problems: Asks Questions Perception: Coherent Willingness to Engage in Self-Management High Activities: Readiness to Engage in Self-Management High Activities: Electronic Signature(s) Signed: 09/01/2022 10:26:37 AM By: [name] RN Entered By: [name] on 08/28/2022 09:39:49 Wisby, Joyice Faster ([id number]) [phone] [email or web address] Page 3 of 5 -------------------------------------------------------------------------------- Fall Risk Assessment Details Patient Name: Date of Service: [name] MES L. 08/28/2022 9:45 A  M Medical Record Number: [id number] Patient Account Number: [id number] Date of Birth/Sex: Treating RN: Oct 28, 1965 (56 y.o. [name]) [name] Primary Care Derrico Zhong: [name] Other Clinician: Referring Beldon Nowling: Treating Joshlynn Alfonzo/Extender: RO BSO N, MICHA EL [place] in Treatment: 0 Fall Risk Assessment Items Have you had 2 or more falls in the last 12 monthso 0 Yes Have you had any fall that resulted in injury in the last 12 monthso 0 Yes FALLS RISK SCREEN History of falling - immediate or  within 3 months 0 No Secondary diagnosis (Do you have 2 or more medical diagnoseso) 0 No Ambulatory aid None/bed rest/wheelchair/nurse 0 Yes Crutches/cane/walker 0 No Furniture 0 No Intravenous therapy Access/Saline/Heparin Lock 0 No Gait/Transferring Normal/ bed rest/ wheelchair 0 Yes Weak (short steps with or without shuffle, stooped but able to lift head while walking, may seek 0 No support from furniture) Impaired (short steps with shuffle, may have difficulty arising from chair, head down, impaired 0 No balance) Mental Status Oriented to own ability 0 Yes Electronic Signature(s) Signed: 09/01/2022 10:26:37 AM By: [name] RN Entered By: [name] on 08/28/2022 09:40:15 -------------------------------------------------------------------------------- Foot Assessment Details Patient Name: Date of Service: [name], [name] MES L. 08/28/2022 9:45 A M Medical Record Number: [id number] Patient Account Number: [id number] Date of Birth/Sex: Treating RN: 06-10-1966 (56 y.o. 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 Site Locations [place], [place] L ([id number]) [phone] [email or web address] Page 4 of 5 + = Sensation present, - = Sensation absent, C =  Callus, U = Ulcer R = Redness, W = Warmth, M = Maceration, PU = Pre-ulcerative lesion F = Fissure, S = Swelling, D = Dryness Assessment Right: Left: Other Deformity: No Prior Foot Ulcer: No Prior Amputation: No Charcot Joint: No Ambulatory Status: Non-ambulatory Assistance Device: Wheelchair Gait: Administrator, arts) Signed: 09/01/2022 10:26:37 AM By: [name] RN Entered By: [name] on 08/28/2022 09:40:55 -------------------------------------------------------------------------------- Nutrition Risk Screening Details Patient Name: Date of Service: [name] MES L. 08/28/2022 9:45 A M Medical Record Number: [id number] Patient Account Number: [id number] Date of Birth/Sex: Treating RN: August 03, 1966 (56 y.o. [name]) [name] Primary Care Tamzin Bertling: [name] Other Clinician: Referring Docia Klar: Treating Despina Boan/Extender: RO BSO N, MICHA EL Molli Posey, Edwin Weeks in Treatment: 0 Height (in): 70 Weight (lbs): 166 Body Mass Index (BMI): 23.8 Nutrition Risk Screening Items Score Screening NUTRITION RISK SCREEN: I have an illness or condition that made me change the kind and/or amount of food I eat 0 No I eat fewer than two meals per day 0 No I eat few fruits and vegetables, or milk products 0 No I have three or more drinks of beer, liquor or wine almost every day 0 No I have tooth or mouth problems that make it hard for me to eat 0 No [place], [place] L ([id number]) [phone] [id number] Page 5 of 5 I don't always have enough money to buy the food I need 0 No I eat alone most of the time 0 No I take three or more different prescribed or over-the-counter drugs a day 1 Yes Without wanting to, I have lost or gained 10 pounds in the last six months 0 No I am not always physically able to shop, cook and/or feed myself 2 Yes Nutrition Protocols Good Risk Protocol Moderate Risk Protocol 0 Provide education on nutrition High Risk Proctocol Risk  Level: Moderate Risk Score: 3 Electronic Signature(s) Signed: 09/01/2022 10:26:37 AM By: [name] RN Entered By: [name] on 08/28/2022 09:40:29

## 2022-09-01 NOTE — Progress Notes (Signed)
Ridgewood, Halliday L ([id number]) [id number] Page 1 of 10 Visit Report for 08/28/2022 Allergy List Details Patient Name: Date of Service: [name] MES L. 08/28/2022 9:45 A M Medical Record Number: [id number] Patient Account Number: [id number] Date of Birth/Sex: Treating RN: Mar 29, 1966 (56 y.o. [name]) [name] Primary Care Grahm Etsitty: [name] Other Clinician: Referring Marven Veley: Treating Marsheila Alejo/Extender: RO BSO N, MICHA EL [place] in Treatment: 0 Allergies Active Allergies Dilaudid Percocet morphine gabapentin Lipitor amoxicillin chlorhexidine rosuvastatin Allergy Notes Electronic Signature(s) Signed: 09/01/2022 10:26:37 AM By: [name] RN Entered By: [name] on 08/28/2022 09:36:21 -------------------------------------------------------------------------------- Diablo Details Patient Name: Date of Service: [name], [name] MES L. 08/28/2022 9:45 A M Medical Record Number: [id number] Patient Account Number: [id number] Date of Birth/Sex: Treating RN: 11-Nov-1965 (56 y.o. [name] Primary Care Dezarai Prew: [name] Other Clinician: Referring Conita Amenta: Treating Davis Vannatter/Extender: RO BSO N, MICHA EL [place] in Treatment: 0 Visit Information FRENCH, KENDRA ([id number]) Patient Arrived: Wheel Chair Arrival Time: 09:29 Accompanied By: cousin Transfer Assistance: None Patient Identification Verified: Yes Secondary Verification Process Completed: Yes Patient Requires Transmission-Based Precautions: No Patient Has Alerts: No History Since Last Visit All ordered tests and consults were completed: No [id number] Page 2 of 10 All ordered tests and consults were completed: No Added or deleted any medications: No Any new allergies or adverse reactions: No Had a fall or experienced change in activities of daily living that may affect risk of falls: No Signs or symptoms of  abuse/neglect since last visito No Hospitalized since last visit: No Implantable device outside of the clinic excluding cellular tissue based products placed in the center since last visit: No Has Dressing in Place as Prescribed: Yes Pain Present Now: No Electronic Signature(s) Signed: 09/01/2022 10:26:37 AM By: [name] RN Entered By: [name] on 08/28/2022 09:37:38 -------------------------------------------------------------------------------- Clinic Level of Care Assessment Details Patient Name: Date of Service: [name] MES L. 08/28/2022 9:45 A M Medical Record Number: [id number] Patient Account Number: [id number] Date of Birth/Sex: Treating RN: September 23, 1966 (56 y.o. [name]) [name] Primary Care Bekim Werntz: [name] Other Clinician: Referring Solon Alban: Treating Corinna Burkman/Extender: RO BSO N, MICHA EL [place] in Treatment: 0 Clinic Level of Care Assessment Items TOOL 1 Quantity Score X- 1 0 Use when EandM and Procedure is performed on INITIAL visit ASSESSMENTS - Nursing Assessment / Reassessment X- 1 20 General Physical Exam (combine w/ comprehensive assessment (listed just below) when performed on new pt. evals) X- 1 25 Comprehensive Assessment (HX, ROS, Risk Assessments, Wounds Hx, etc.) ASSESSMENTS - Wound and Skin Assessment / Reassessment []  - 0 Dermatologic / Skin Assessment (not related to wound area) ASSESSMENTS - Ostomy and/or Continence Assessment and Care []  - 0 Incontinence Assessment and Management []  - 0 Ostomy Care Assessment and Management (repouching, etc.) PROCESS - Coordination of Care X - Simple Patient / Family Education for ongoing care 1 15 []  - 0 Complex (extensive) Patient / Family Education for ongoing care X- 1 10 Staff obtains Consents, Records, T Results / Process Orders est []  - 0 Staff telephones HHA, Nursing Homes / Clarify orders / etc []  - 0 Routine Transfer to another Facility (non-emergent condition) []  -  0 Routine Hospital Admission (non-emergent condition) X- 1 15 New Admissions / Biomedical engineer / Ordering NPWT Apligraf, etc. , []  - 0 Emergency Hospital Admission (emergent condition) PROCESS - Special Needs []  - 0 Pediatric / Minor Patient Management TAIKI, BUCKWALTER ([id number]) [id number] Page 3  of 10 []  - 0 Isolation Patient Management []  - 0 Hearing / Language / Visual special needs []  - 0 Assessment of Community assistance (transportation, D/C planning, etc.) []  - 0 Additional assistance / Altered mentation []  - 0 Support Surface(s) Assessment (bed, cushion, seat, etc.) INTERVENTIONS - Miscellaneous []  - 0 External ear exam []  - 0 Patient Transfer (multiple staff / Civil Service fast streamer / Similar devices) []  - 0 Simple Staple / Suture removal (25 or less) []  - 0 Complex Staple / Suture removal (26 or more) []  - 0 Hypo/Hyperglycemic Management (do not check if billed separately) []  - 0 Ankle / Brachial Index (ABI) - do not check if billed separately Has the patient been seen at the hospital within the last three years: Yes Total Score: 85 Level Of Care: New/Established - Level 3 Electronic Signature(s) Signed: 09/01/2022 10:26:37 AM By: [name] RN Entered By: [name] on 08/28/2022 10:53:05 -------------------------------------------------------------------------------- Encounter Discharge Information Details Patient Name: Date of Service: [name], [name] MES L. 08/28/2022 9:45 A M Medical Record Number: [id number] Patient Account Number: [id number] Date of Birth/Sex: Treating RN: 1966-03-14 (56 y.o. [name]) [name] Primary Care Shantea Poulton: [name] Other Clinician: Referring Mayrani Khamis: Treating Deisha Stull/Extender: RO BSO N, MICHA EL [place] in Treatment: 0 Encounter Discharge Information Items Post Procedure Vitals Discharge Condition: Stable Temperature (F): 98.2 Ambulatory Status: Wheelchair Pulse (bpm):  71 Discharge Destination: Home Respiratory Rate (breaths/min): 18 Transportation: Private Auto Blood Pressure (mmHg): 161/89 Accompanied By: wife Schedule Follow-up Appointment: Yes Clinical Summary of Care: Electronic Signature(s) Signed: 08/28/2022 10:55:29 AM By: [name] RN Entered By: [name] on 08/28/2022 10:55:29 Deakin, Joyice Faster ([id number]) [id number] Page 4 of 10 -------------------------------------------------------------------------------- Lower Extremity Assessment Details Patient Name: Date of Service: [name] MES L. 08/28/2022 9:45 A M Medical Record Number: [id number] Patient Account Number: [id number] Date of Birth/Sex: Treating RN: 04/01/1966 (56 y.o. [name]) [name] Primary Care Britnee Mcdevitt: [name] Other Clinician: Referring Ariyah Sedlack: Treating Uthman Mroczkowski/Extender: RO BSO N, MICHA EL [place] in Treatment: 0 Electronic Signature(s) Signed: 09/01/2022 10:26:37 AM By: [name] RN Entered By: [name] on 08/28/2022 09:50:56 -------------------------------------------------------------------------------- Multi Wound Chart Details Patient Name: Date of Service: [name], [name] MES L. 08/28/2022 9:45 A M Medical Record Number: [id number] Patient Account Number: [id number] Date of Birth/Sex: Treating RN: Oct 06, 1966 (56 y.o. [name]) [name] Primary Care Alexismarie Flaim: [name] Other Clinician: Referring Cap Massi: Treating March Joos/Extender: RO BSO N, MICHA EL [place] in Treatment: 0 Vital Signs Height(in): 70 Pulse(bpm): 71 Weight(lbs): 166 Blood Pressure(mmHg): 161/89 Body Mass Index(BMI): 23.8 Temperature(F): 98.2 Respiratory Rate(breaths/min): 18 [3:Photos:] [N/A:N/A] Left Amputation Site - Below Knee N/A N/A Wound Location: Surgical Injury N/A N/A Wounding Event: Dehisced Wound N/A N/A Primary Etiology: Cataracts, Anemia, Sleep Apnea, N/A N/A Comorbid History: Arrhythmia, Congestive  Heart Failure, Coronary Artery Disease, Hypertension, Myocardial Infarction, Type II Diabetes, Neuropathy, Seizure Disorder 07/19/2022 N/A N/A Date Acquired: AMMON, MUSCATELLO ([id number]) [id number] Page 5 of 10 0 N/A N/A Weeks of Treatment: Open N/A N/A Wound Status: No N/A N/A Wound Recurrence: 11.5x1.5x0.3 N/A N/A Measurements L x W x D (cm) 13.548 N/A N/A A (cm) : rea 4.064 N/A N/A Volume (cm) : Full Thickness Without Exposed N/A N/A Classification: Support Structures Medium N/A N/A Exudate A mount: Serosanguineous N/A N/A Exudate Type: red, brown N/A N/A Exudate Color: Medium (34-66%) N/A N/A Granulation A mount: Pink N/A N/A Granulation Quality: Medium (34-66%) N/A N/A Necrotic A mount: Fat Layer (Subcutaneous Tissue):  Yes N/A N/A Exposed Structures: Fascia: No Tendon: No Muscle: No Joint: No Bone: No None N/A N/A Epithelialization: Debridement - Excisional N/A N/A Debridement: Pre-procedure Verification/Time Out 10:15 N/A N/A Taken: Lidocaine 4% Topical Solution N/A N/A Pain Control: Subcutaneous, Slough N/A N/A Tissue Debrided: Skin/Subcutaneous Tissue N/A N/A Level: 7.5 N/A N/A Debridement A (sq cm): rea Curette N/A N/A Instrument: Minimum N/A N/A Bleeding: Pressure N/A N/A Hemostasis A chieved: 0 N/A N/A Procedural Pain: 0 N/A N/A Post Procedural Pain: Procedure was tolerated well N/A N/A Debridement Treatment Response: 11.5x1.5x0.3 N/A N/A Post Debridement Measurements L x W x D (cm) 4.064 N/A N/A Post Debridement Volume: (cm) Debridement N/A N/A Procedures Performed: Treatment Notes Electronic Signature(s) Signed: 08/28/2022 3:56:39 PM By: Linton Ham MD Previous Signature: 08/28/2022 9:57:03 AM Version By: [name] RN Entered By: Linton Ham on 08/28/2022 10:39:08 -------------------------------------------------------------------------------- Multi-Disciplinary Care Plan  Details Patient Name: Date of Service: [name], [name] MES L. 08/28/2022 9:45 A M Medical Record Number: [id number] Patient Account Number: [id number] Date of Birth/Sex: Treating RN: 1966-03-11 (56 y.o. [name] Primary Care Linea Calles: [name] Other Clinician: Referring Elly Haffey: Treating Ridley Schewe/Extender: RO BSO N, MICHA EL [place] in Treatment: 0 Active Inactive Abuse / Safety / Falls / Self Care Management Nursing Diagnoses: Potential for injury related to falls GoalsJAYVEION, STALLING ([id number]) [id number] Page 6 of 10 Patient will remain injury free related to falls Date Initiated: 08/28/2022 Target Resolution Date: 09/27/2022 Goal Status: Active Interventions: Assess Activities of Daily Living upon admission and as needed Assess fall risk on admission and as needed Assess: immobility, friction, shearing, incontinence upon admission and as needed Assess impairment of mobility on admission and as needed per policy Assess personal safety and home safety (as indicated) on admission and as needed Assess self care needs on admission and as needed Notes: Necrotic Tissue Nursing Diagnoses: Knowledge deficit related to management of necrotic/devitalized tissue Goals: Necrotic/devitalized tissue will be minimized in the wound bed Date Initiated: 08/28/2022 Target Resolution Date: 10/17/2022 Goal Status: Active Interventions: Assess patient pain level pre-, during and post procedure and prior to discharge Notes: Nutrition Nursing Diagnoses: Impaired glucose control: actual or potential Goals: Patient/caregiver will maintain therapeutic glucose control Date Initiated: 08/28/2022 Target Resolution Date: 10/17/2022 Goal Status: Active Interventions: Assess HgA1c results as ordered upon admission and as needed Assess patient nutrition upon admission and as needed per policy Notes: Wound/Skin Impairment Nursing Diagnoses: Knowledge  deficit related to ulceration/compromised skin integrity Goals: Patient/caregiver will verbalize understanding of skin care regimen Date Initiated: 08/28/2022 Target Resolution Date: 10/17/2022 Goal Status: Active Ulcer/skin breakdown will have a volume reduction of 30% by week 4 Date Initiated: 08/28/2022 Target Resolution Date: 10/17/2022 Goal Status: Active Ulcer/skin breakdown will have a volume reduction of 50% by week 8 Date Initiated: 08/28/2022 Target Resolution Date: 11/17/2022 Goal Status: Active Ulcer/skin breakdown will have a volume reduction of 80% by week 12 Date Initiated: 08/28/2022 Target Resolution Date: 12/18/2022 Goal Status: Active Ulcer/skin breakdown will heal within 14 weeks Date Initiated: 08/28/2022 Target Resolution Date: 01/16/2023 Goal Status: Active Interventions: Assess patient/caregiver ability to obtain necessary supplies Assess patient/caregiver ability to perform ulcer/skin care regimen upon admission and as needed Assess ulceration(s) every visit Notes: Electronic Signature(s) Signed: 08/28/2022 9:56:49 AM By: [name] RN Annamaria Boots, Killian ([id number]) [id number] Page 7 of 10 Entered By: [name] on 08/28/2022 09:56:49 -------------------------------------------------------------------------------- Pain Assessment Details Patient Name: Date of Service: [name] MES L. 08/28/2022 9:45 A M Medical Record Number:  [id number] Patient Account Number: [id number] Date of Birth/Sex: Treating RN: 12-09-1965 (56 y.o. [name]) [name] Primary Care Misha Antonini: [name] Other Clinician: Referring Jaunice Mirza: Treating Undray Allman/Extender: RO BSO N, MICHA EL [place] in Treatment: 0 Active Problems Location of Pain Severity and Description of Pain Patient Has Paino No Site Locations Pain Management and Medication Current Pain Management: Electronic Signature(s) Signed: 09/01/2022 10:26:37 AM By: [name]  RN Entered By: [name] on 08/28/2022 09:37:42 -------------------------------------------------------------------------------- Patient/Caregiver Education Details Patient Name: Date of Service: [name] MES L. 11/9/2023andnbsp9:45 A M Medical Record Number: [id number] Patient Account Number: [id number] Date of Birth/Gender: Treating RN: Feb 21, 1966 (56 y.o. [name] Primary Care Physician: [name] Other Clinician: DEMONDRE, AGUAS ([id number]) [id number] Page 8 of 10 Referring Physician: Treating Physician/Extender: RO BSO N, MICHA EL [place] in Treatment: 0 Education Assessment Education Provided To: Patient Education Topics Provided Wound/Skin Impairment: Methods: Explain/Verbal Responses: State content correctly Electronic Signature(s) Signed: 09/01/2022 10:26:37 AM By: [name] RN Entered By: [name] on 08/28/2022 10:53:41 -------------------------------------------------------------------------------- Wound Assessment Details Patient Name: Date of Service: [name], [name] MES L. 08/28/2022 9:45 A M Medical Record Number: [id number] Patient Account Number: [id number] Date of Birth/Sex: Treating RN: 11-25-1965 (56 y.o. [name]) [name] Primary Care Wayland Baik: [name] Other Clinician: Referring Devona Holmes: Treating Joslynne Klatt/Extender: RO BSO N, MICHA EL [place] in Treatment: 0 Wound Status Wound Number: 3 Primary Dehisced Wound Etiology: Wound Location: Left Amputation Site - Below Knee Wound Open Wounding Event: Surgical Injury Status: Date Acquired: 07/19/2022 Comorbid Cataracts, Anemia, Sleep Apnea, Arrhythmia, Congestive Heart Weeks Of Treatment: 0 History: Failure, Coronary Artery Disease, Hypertension, Myocardial Clustered Wound: No Infarction, Type II Diabetes, Neuropathy, Seizure Disorder Photos Wound Measurements Length: (cm) 11.5 Width: (cm) 1.5 Depth: (cm) 0.3 Area: (cm)  13.548 Volume: (cm) 4.064 % Reduction in Area: % Reduction in Volume: Epithelialization: None Tunneling: No Undermining: No Wound Description Classification: Full Thickness Without Exposed Support Structures Vallely, Krishon L ([id number]) Exudate Amount: Medium Exudate Type: Serosanguineous Exudate Color: red, brown Foul Odor After Cleansing: No [id number] Page 9 of 10 Slough/Fibrino Yes Wound Bed Granulation Amount: Medium (34-66%) Exposed Structure Granulation Quality: Pink Fascia Exposed: No Necrotic Amount: Medium (34-66%) Fat Layer (Subcutaneous Tissue) Exposed: Yes Necrotic Quality: Adherent Slough Tendon Exposed: No Muscle Exposed: No Joint Exposed: No Bone Exposed: No Treatment Notes Wound #3 (Amputation Site - Below Knee) Wound Laterality: Left Cleanser Peri-Wound Care Topical Santyl Collagenase Ointment, 30 (gm), tube Discharge Instruction: apply nickel thick to wound bed only Primary Dressing Gauze Discharge Instruction: moistened with normal saline Secondary Dressing ABD Pad 5x9 (in/in) Discharge Instruction: Cover with ABD pad Secured With Medipore T - 54M Medipore H Soft Cloth Surgical T ape ape, 2x2 (in/yd) Tubigrip Size E, 3.5x10 (in/yds) Discharge Instruction: Apply 3 Tubigrip E 3-finger-widths below knee to base of toes to secure dressing and/or for swelling. Compression Wrap Compression Stockings Add-Ons Electronic Signature(s) Signed: 09/01/2022 10:26:37 AM By: [name] RN Entered By: [name] on 08/28/2022 09:50:44 -------------------------------------------------------------------------------- Vitals Details Patient Name: Date of Service: [name], JA MES L. 08/28/2022 9:45 A M Medical Record Number: [id number] Patient Account Number: [id number] Date of Birth/Sex: Treating RN: 08-26-1966 (56 y.o. [name]) [name] Primary Care Arzella Rehmann: [name] Other Clinician: Referring Jeson Camacho: Treating  Lynda Wanninger/Extender: RO BSO N, MICHA EL [place] in Treatment: 0 Vital Signs Time Taken: 09:35 Temperature (F): 98.2 Height (in): 70 Pulse (bpm): 71 Source: Stated Respiratory Rate (breaths/min): 18  [place], [place] L ([id number]) [id number] Page 10 of 10 Weight (lbs): 166 Blood Pressure (mmHg): 161/89 Source: Stated Reference Range: 80 - 120 mg / dl Body Mass Index (BMI): 23.8 Electronic Signature(s) Signed: 09/01/2022 10:26:37 AM By: [name] RN Entered By: [name] on 08/28/2022 09:37:54

## 2022-09-01 NOTE — Progress Notes (Signed)
ASHAZ, ROBLING ([id number]) [id number] Page 1 of 11 Visit Report for 08/28/2022 Chief Complaint Document Details Patient Name: Date of Service: [name] 08/28/2022 9:45 A M Medical Record Number: [id number] Patient Account Number: [id number] Date of Birth/Sex: Treating RN: 1966/03/13 (56 y.o. [name]) [name] Primary Care Provider: [name] Other Clinician: Referring Provider: Treating Provider/Extender: RO BSO N, MICHA EL [place]bert [place] in Treatment: 0 Information Obtained from: Patient Chief Complaint HBO evaluation for treatment on the left foot diabetic ulcer with osteomyelitis. Patient is currently under our care as well for his foot. 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 08/28/2022 9:45 A M Medical Record Number: [id number] Patient Account Number: [id number] Date of Birth/Sex: Treating RN: 1965/12/04 (56 y.o. [name]) [name] Primary Care Provider: [name] Other Clinician: Referring Provider: Treating Provider/Extender: RO BSO N, MICHA EL [place] in Treatment: 0 Debridement Performed for Assessment: Wound #3 Left Amputation Site - Below Knee Performed By: Physician Ricard Dillon, MD Debridement Type: Debridement Level of Consciousness (Pre-procedure): Awake and Alert Pre-procedure Verification/Time Out Yes - 10:15 Taken: Start Time: 10:15 Pain Control: Lidocaine 4% T opical Solution T Area Debrided (L x W): otal 5 (cm) x 1.5 (cm) = 7.5 (cm) Tissue and other material debrided: Viable, Non-Viable, Slough, Subcutaneous, Slough Level: Skin/Subcutaneous Tissue Debridement Description:  Excisional Instrument: Curette Bleeding: Minimum Hemostasis Achieved: Pressure End Time: 10:18 Procedural Pain: 0 Post Procedural Pain: 0 Raynor, Nathanuel L ([id number]) [id number] Page 2 of 11 Response to Treatment: Procedure was tolerated well Level of Consciousness (Post- Awake and Alert procedure): Post Debridement Measurements of Total Wound Length: (cm) 11.5 Width: (cm) 1.5 Depth: (cm) 0.3 Volume: (cm) 4.064 Character of Wound/Ulcer Post Debridement: Improved Post Procedure Diagnosis Same as Pre-procedure Electronic Signature(s) Signed: 08/28/2022 3:56:39 PM By: Linton Ham MD Signed: 09/01/2022 10:26:37 AM By: [name] RN Entered By: Linton Ham on 08/28/2022 10:39:19 -------------------------------------------------------------------------------- HPI Details Patient Name: Date of Service: [name], [name] MES L. 08/28/2022 9:45 A M Medical Record Number: [id number] Patient Account Number: [id number] Date of Birth/Sex: Treating RN: 05-14-1966 (56 y.o. [name] Primary Care Provider: [name] Other Clinician: Referring Provider: Treating Provider/Extender: RO BSO N, MICHA EL [place] in Treatment: 0 History of Present Illness HPI Description: 10/31/2021 patient presents today for evaluation here in our clinic. He is actually being evaluated for hyperbaric oxygen therapy only. He has been seen at [facility] up to this point he is under the care of the vascular surgery specialty. Subsequently he is also under the care of the hyperbaric center there. With that being said that due to the fact that he is a dialysis patient he was actually recommended to be transferred to [place] for his treatments he actually lives in [place] which is where his dialysis is. It was a hardship for him to try to get from [place] especially on dialysis days all the way to [facility] get into the facility for his treatment and get back  home he was literally spending all day long. He has had just 4 treatments there at [facility] thus far based on what I see in these have not been continued while he awaits approval here in our clinic. Subsequently I do have records for  review that will be included in the HPI predetermination review and attached to this chart as well. I will not duplicate that here. 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 Nonetheless he continues to have exposed bone with marked soft tissue swelling compatible with osteomyelitis. This is due to the irregularity of the head of the metatarsal as well. Nonetheless along with having associated cellulitis he is also good to be seen by infectious disease and is currently on antibiotics for this as well. Again that will be detailed in the pretreatment review section which I will attach to this note as well. He does currently have a wound VAC in place and again the wound was not evaluated here in the clinic as that is being completely managed by home health and [facility]. The patient does have a history of diabetes mellitus type 2, hypertension, coronary artery disease, and congestive heart failure. He also has cataracts of both eyes but no evidence of glaucoma on his most recent eye exam. 11/22/2021 we have been seeing this gentleman for hyperbaric oxygen therapy but have not actually evaluated his wound up to this point this was being managed by the wound care and vascular team I presumed at [facility] at least that is what has been told to be previous. Nonetheless at this point I got a call from Atlee Abide who is a Librarian, academic at the [facility] vascular clinic with Dr. Durene Fruits and subsequently they were wanting to know if I could take over wound care for this patient. Apparently they do not have the ability for an office  debridement which again I completely understand but nonetheless definitely is not an integral part of his healing along with the hyperbarics. Subsequently I discussed with the patient today that I do believe he would benefit going ahead and proceeding with evaluation here in the clinic for that reason I did go ahead and see him today to get things started. There is also been some confusion about the issues with his home health nurse and getting measurements to [facility] for the wound VAC in order to continue his wound VAC therapy. With that being said after I see him today we will make a determination on what to do with wound VAC we can definitely send measurements to [facility] but again the bigger question is whether this is the best way to go or not. 11/28/2021 upon evaluation today patient appears to be doing decently well in regard to his wound. He has been tolerating the dressing changes with the Dakin's moistened gauze dressing which I think is doing a good job. Fortunately I do not see any signs of active infection locally nor systemically at this point. In general I think that he is actually making excellent progress which is great news. 2/17; seen in conjunction with HBO today. 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 Much improved condition of the wound surface. On the lateral foot and eschared area that he has been using Betadine. T olerating HBO well 12/13/2021 upon evaluation today patient appears to be doing pretty well in regard to his foot ulcer. I am actually much more pleased currently that I been with the way the appearance looks today. The wound bed does show some signs of slough and biofilm on the surface of the wound this  is minimal I Minna perform debridement the I will do so very carefully due to the fact that to be honest the patient had a significant amount of bleeding in the past being on the Brilinta he had a difficult time clotting when I debrided him  previously quite significantly. Fortunately I do not see any signs of active infection at this time which is great [place], [place]ie L ([id number]) [id number] Page 3 of 11 news. No fevers, chills, nausea, vomiting, or diarrhea. 12/20/2021 upon evaluation today patient actually appears to be showing some signs of excellent improvement here. I am very pleased with where things stand currently. There does not appear to be any evidence of active infection locally nor systemically which is great news and I do believe that with the hyperbaric oxygen therapy he is actually making excellent progress. 3/9; patient presents for follow-up. He continues to do HBO therapy without issues. He has completed 34 out of 40 sessions. He continues to use Dakin's wet- to-dry to the amputation site and Betadine to the lateral foot wound. he currently denies signs of infection. 01/03/2022 upon evaluation today patient appears to be doing better in regard to his distal foot ulcer. Unfortunately the lateral foot is a different story this area has softened up and is getting need to be debrided away today. I Georgina Peer do this with scissors and forceps and was able to clear that out that will be detailed below. Otherwise I feel like the Dakin's moistened gauze dressing is doing an awesome job for him and he is making significantly improvements here. 01/10/2022 upon evaluation today patient continues to have some of the necrotic tissue loosen up on the lateral portion of his foot the main wound that we have been treating is actually doing quite well. Overall very pleased with where things stand in general. No fevers, chills, nausea, vomiting, or diarrhea. 01/17/2022 upon evaluation today patient appears to be doing somewhat poorly in regard to the lateral foot wound. The main wound that we initially were seeing him for looks to be doing great. This lateral foot has worsened and unfortunately it appears to potentially  have some signs of infection here. I do believe that we may need to see about getting started on some IV antibiotics he is in dialysis so they could potentially do this for him, Monday. Nonetheless I want to try to get that started ASAP. Again I would prefer him be initiated with IV vancomycin and cefepime just as an empiric broad-spectrum until we can get the culture results back at least. Subsequently also think that this should be done sooner rather than later due to the fact that his leg is more swollen and overall this seems to have progressed fairly rapidly since last week I do not want this to get worse. He voiced understanding. 01-24-2022 upon evaluation today patient appears to be doing well with regard to his main ulcer that we have been seeing him for. I am very pleased with where things stand currently. He still continues to have bleeding if we do any type of aggressive sharp debridement there for him having to be very careful using Dakin's solution along with debridement as I can here in the office. Overall the patient seems to be doing well. Unfortunately the lateral foot wound still has progressed a little bit further. We did get him on antibiotics starting this past Monday I think the drainage looks much better and overall I am seeing improvement but again this was already broken down  to a certain degree I am hopeful by next week this will really start turnaround for [place]. We will also get a send him for an x-ray to evaluate for any signs of osteomyelitis on this lateral foot. 01-31-2022 upon evaluation today patient's wound on the medial left amputation site is actually doing excellent. I am very pleased with where things stand this is significantly smaller and we are headed in the right direction. In regard to the left lateral foot there is still some necrotic tissue to be cleared away although I see evidence of this starting to basically solidify as far as the edges are concerned with how  far it spread I do not see anything seeming to want to spread further. I believe this may be close to done as far as any evolving is concerned. 02-07-2022 upon evaluation today patient appears to be doing well with regard to the main amputation site. Unfortunately the secondary site on the left lateral foot is still giving him some trouble here. There is a lot of dried tissue and I do believe that along with the Dakin's we may want to use a contact layer in the base of the wound to try to prevent the bone from drying out because we are down to that point at this time. The patient voiced understanding at this point as well and is happy to do what ever it takes to try to get this better. With that being said I do believe that it is possible he may benefit from a wound VAC. Again the question is the approval process for getting this done obviously we can see what we can do if we get things cleaned a little bit better but right now I think the best option is going to be for [place] to continue with the packing currently. 02-14-2022 upon evaluation today patient presents for follow-up concerning ongoing issues with his foot. He did have the MRI which does show that he has evidence of osteomyelitis of the digit at the fifth location of his foot. Subsequently this does extend down to the base of the digit and into the proximal portion of the metatarsal as well. With that being said the bone does not appear to be too severely broken down and a lot of the marrow signal is stated to be maintained which is good news. Nonetheless I do believe that he is unfortunately still in a spot where this is precarious and we need to make sure that we are treating this appropriately. He does seem to be doing well with IV antibiotics he also seems to be doing quite well when it comes to the overall appearance of the wound bed at this point. I am happy in that regard. 02-21-2022 upon evaluation today patient appears to be doing well all  things considered in regard to his wounds. I do feel like that he is making some progress here. The wound on the right distal foot where the amputation site is is actually healing nicely I feel like we can switch over to collagen at this point that should do well for him. In regard to the left lateral foot this is actually getting much cleaner he still has some area where there seems to be some pressure getting to the location I think that he would do well with a knee scooter at this point. I discussed that with him he does not seem prone to going that route at this point he understands it can definitely help. 02-28-2022 upon  evaluation today patient appears to be doing well currently with regard to the wound on his foot. The main surgical wound is actually significantly improved and the lateral wound is doing better there is still bone exposed but nonetheless this does not appear to be significantly worse than what we have noted previous. In fact I think it looks better than it did last week. My goal is to keep the bone from drying out and subsequently for that reason we are going to go ahead and proceed with the EpiFix which I think is good to be a good option here for him. Over top of the EpiFix we will do a contact layer in order to hopefully keep things from drying out. This dressing will actually stay in place until he comes back. Patient did see vascular yesterday and states that they did not feel like anything was doing worse still it appears that if it were left up to them they think the ideal thing would be to proceed with an amputation. I explained to the patient that to be honest is not always avoidable but if we can try to avoid amputation that is what we are after. He voiced understanding and he does want to do what ever he can to try to prevent amputation for that reason we are proceeding with the epi cord today. This will be application #1. [id number] upon evaluation today patient appears  to be making some progress here regard to the lateral foot. Fortunately there does not appear to be any signs of active infection locally or systemically which is great news. No fevers, chills, nausea, vomiting, or diarrhea. I do think that he is appropriate for second application of EpiCord today. 03-14-2022 upon evaluation today patient's wound still has significant exposure of the bone at this point. He did get crutches he has been trying to use those the past couple of days am not sure that is going so well to be perfectly honest. We did discuss however offloading is important and we even discussed a total contact cast but to be perfectly honest right now and feels like he may need more debridement to clear away some of the bone which is not viable in the end of the foot of the fifth metatarsal but I am unable to really do that here in the office due to his bleeding. I do not know if potentially we could have him seen by podiatry potentially Dr. Merril Abbe to see if there is anything he can do to help in this regard. I am not even certain whether the majority of the metatarsal is even viable to be honest. Either way I do think that he would benefit from surgical debridement or even possibly partial ray amputation in order for [place] to try to keep things moving in the right direction and get this closed. This was all discussed with the patient today. With that being said I know he wants to avoid a below-knee amputation which is what has previously been recommended. 03-21-2022 upon evaluation today patient appears to be doing well in regard to the medial wound at this is shown signs of excellent improvement. In regard to the lateral wound this is a bit dry compared to what I want to see him Regino Schultze not draining as much but I think that we need to see what we can do about trying to keep this from drying out and keeping it a bit more moist. Patient voiced understanding. Overall I am very pleased otherwise  with where we stand. 03-27-2022 upon evaluation today patient appears to be doing well currently in regard to his wound especially on the medial aspect this looks really good the lateral aspect is looking a little bit better to with some healthier appearance to the bone compared to last week to be honest. Fortunately I do not see any signs of active infection locally or systemically at this time which is great news. No fevers, chills, nausea, vomiting, or diarrhea. READMISSION 08/28/2022 This is a 56 year old man who is a diabetic on dialysis but not currently on any treatment for it. He has a known history of PAD. He was in this clinic for a protracted period of time this year for a left foot wound with underlying osteomyelitis for which she was treated with hyperbaric oxygen. As I understand things he left the clinic in June with wounds and an unhealed state possibly infected. 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 Unfortunately in September he required a revision of the BKA but he was ultimately admitted to [facility] for a protracted [place], [place] L ([id number]) [id number] Page 4 of 11 period with bacteremia secondary to group A strep from 9/12-10/12. Marland Kitchen He is recently completed vancomycin he was taking at dialysis He has been left with a nonhealing surgical wound on his left BKA amputation site. He recently followed with vascular surgery who recommended Santyl and Vashe solution which they have been applying to the wound every day. It is noted that he does have severe PAD in the right lower extremity that is status post transmetatarsal amputation he is going to follow him up in 3 months. He is also seen infectious disease at [facility] who state that he had MRSA treated with cefazolin and vancomycin at hemodialysis. However he is listed as having an allergic reaction to cefazolin including lip swelling therefore he  completed vancomycin. He is not felt to require any additional antibiotics. Electronic Signature(s) Signed: 08/28/2022 3:56:39 PM By: Linton Ham MD Entered By: Linton Ham on 08/28/2022 10:55:02 -------------------------------------------------------------------------------- Physical Exam Details Patient Name: Date of Service: [name], [name] MES L. 08/28/2022 9:45 A M Medical Record Number: [id number] Patient Account Number: [id number] Date of Birth/Sex: Treating RN: [birth year] (56 y.o. [name] Primary Care Provider: [name] Other Clinician: Referring Provider: Treating Provider/Extender: RO BSO N, MICHA EL [place]bert [place] in Treatment: 0 Constitutional Patient is hypertensive.. Pulse regular and within target range for patient.Marland Kitchen Respirations regular, non-labored and within target range.. Temperature is normal and within the target range for the patient.Marland Kitchen appears in no distress. Respiratory Respiratory effort is easy and symmetric bilaterally. Rate is normal at rest and on room air.. Cardiovascular Both his femoral and popliteal pulse are palpable on the left. Psychiatric No evidence of depression, anxiety, or agitation. Calm, cooperative, and communicative. Appropriate interactions and affect.. Notes Wound exam; the patient has 2 areas on the incision site of his left BKA stump. 1 on the anterior medial aspect and the other on the far lateral aspect. Both of these wound areas have necrotic debris on the surface requiring subcutaneous debridement hemostasis with silver nitrate and direct pressure. There was no evidence of infection around the wound site at present. No tenderness no purulence. No cultures were done Electronic Signature(s) Signed: 08/28/2022 3:56:39 PM By: Linton Ham MD Entered By: Linton Ham on 08/28/2022 10:56:44 -------------------------------------------------------------------------------- Physician Orders Details Patient Name:  Date of  Service: [name], JA MES L. 08/28/2022 9:45 A M Medical Record Number: [id number] Patient Account Number: [id number] Date of Birth/Sex: Treating RN: 06-17-1966 ([address] y.o. [name]) [name] [place], [place] ([id number]) [phone].pdf Page 5 of 11 Primary Care Provider: [name] Other Clinician: Referring Provider: Treating Provider/Extender: RO BSO N, MICHA EL [place]bert [place] in Treatment: 0 Verbal / Phone Orders: No Diagnosis Coding Follow-up Appointments Return Appointment in 1 week. [place] for wound care. May utilize formulary equivalent dressing for wound treatment orders unless otherwise specified. Home Health Nurse may visit PRN to address patients wound care needs. Jackquline [place] [phone] Bathing/ Shower/ Hygiene May shower; gently cleanse wound with antibacterial soap, rinse and pat dry prior to dressing wounds Edema Control - Lymphedema / Segmental Compressive Device / Other Tubigrip double layer applied - to stump and above knee Wound Treatment Wound #3 - Amputation Site - Below Knee Wound Laterality: Left Topical: Santyl Collagenase Ointment, 30 (gm), tube 1 x Per Day/30 Days Discharge Instructions: apply nickel thick to wound bed only Prim Dressing: Gauze 1 x Per Day/30 Days ary Discharge Instructions: moistened with normal saline Secondary Dressing: ABD Pad 5x9 (in/in) 1 x Per Day/30 Days Discharge Instructions: Cover with ABD pad Secured With: Medipore T - 70M Medipore H Soft Cloth Surgical T ape ape, 2x2 (in/yd) 1 x Per Day/30 Days Secured With: Tubigrip Size D, 3x10 (in/yd) 1 x Per Day/30 Days Electronic Signature(s) Signed: 08/28/2022 3:56:39 PM By: Linton Ham MD Signed: 09/01/2022 10:26:37 AM By: [name] RN Entered By: [name] on 08/28/2022 12:36:47 -------------------------------------------------------------------------------- Problem List Details Patient Name: Date of Service: [name], [name] MES L. 08/28/2022 9:45 A M Medical Record Number: [id number] Patient Account Number: [id number] Date of Birth/Sex: Treating RN: [birth year] (56 y.o. [name] Primary Care Provider: [name] Other Clinician: Referring Provider: Treating Provider/Extender: RO BSO N, MICHA EL [place] in Treatment: 0 Active Problems ICD-10 Encounter Code Description Active Date MDM Diagnosis L97.828 Non-pressure chronic ulcer of other part of left lower leg with other specified 08/28/2022 No Yes severity Cothran, Toryn L ([id number]) [id number] Page [phone] Acquired absence of left leg below knee 08/28/2022 No Yes E11.51 Type 2 diabetes mellitus with diabetic peripheral angiopathy without gangrene 08/28/2022 No Yes L03.116 Cellulitis of left lower limb 08/28/2022 No Yes Inactive Problems Resolved Problems Electronic Signature(s) Signed: 08/28/2022 3:56:39 PM By: Linton Ham MD Entered By: Linton Ham on 08/28/2022 10:24:56 -------------------------------------------------------------------------------- Progress Note Details Patient Name: Date of Service: [name], [name] MES L. 08/28/2022 9:45 A M Medical Record Number: [id number] Patient Account Number: [id number] Date of Birth/Sex: Treating RN: [birth year] (56 y.o. [name]) [name] Primary Care Provider: [name] Other Clinician: Referring Provider: Treating Provider/Extender: RO BSO N, MICHA EL [place] in Treatment: 0 Subjective Chief Complaint Information obtained from Patient HBO evaluation for treatment on the left foot diabetic ulcer with osteomyelitis. Patient is currently under our care as well for his foot. 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 He is actually being evaluated for hyperbaric oxygen therapy only. He has  been seen at [facility] up to this point he is under the care of the vascular surgery specialty. Subsequently he is also under the care of the hyperbaric center there. With that being said that due to the fact that  he is a dialysis patient he was actually recommended to be transferred to [place] for his treatments he actually lives in [place] which is where his dialysis is. It was a hardship for him to try to get from [place] especially on dialysis days all the way to [facility] get into the facility for his treatment and get back home he was literally spending all day long. He has had just 4 treatments there at [facility] thus far based on what I see in these have not been continued while he awaits approval here in our clinic. Subsequently I do have records for review that will be included in the HPI predetermination review and attached to this chart as well. I will not duplicate that here. 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 Nonetheless he continues to have exposed bone with marked soft tissue swelling compatible with osteomyelitis. This is due to the irregularity of the head of the metatarsal as well. Nonetheless along with having associated cellulitis he is also good to be seen by infectious disease and is currently on antibiotics for this as well. Again that will be detailed in the pretreatment review section which I will attach to this note as well. He does currently have a wound VAC in place and again the wound was not evaluated here in the clinic as that is being completely managed by home health and [facility]. The patient does have a history of diabetes mellitus type 2, hypertension, coronary artery disease, and congestive heart failure. He also has cataracts of both eyes but no evidence of glaucoma on his most recent eye  exam. 11/22/2021 we have been seeing this gentleman for hyperbaric oxygen therapy but have not actually evaluated his wound up to this point this was being managed by the wound care and vascular team I presumed at [place] at least that is what has been told to be previous. Nonetheless at this point I got a call from Atlee Abide who is a Librarian, academic at the [facility] vascular clinic with Dr. Durene Fruits and subsequently they were wanting to know if I could take over wound care for this patient. Apparently they do not have the ability for an office debridement which again I completely understand but nonetheless definitely is not an integral part of his healing along with the hyperbarics. Subsequently I discussed with the patient today that I do believe he would benefit going ahead and proceeding with evaluation here in the clinic for that reason I did go ahead and see him today to get things started. There is also been some confusion about the issues with his home health nurse and getting measurements to [facility] for the wound VAC in order to continue his wound VAC therapy. With that being said after I see him today we will make a determination on what to do with wound VAC we can definitely send measurements to [facility] but again the bigger question is whether this is the best way to go or not. 11/28/2021 upon evaluation today patient appears to be doing decently well in regard to his wound. He has been tolerating the dressing changes with the Dakin's moistened gauze dressing which I think is doing a good job. Fortunately I do not see any signs of active infection locally nor systemically at this point. In [place], [place] L ([id number]) [id number] Page 7 of  11 general I think that he is actually making excellent progress which is great news. 2/17; seen in conjunction with HBO today. 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 Much improved condition of the  wound surface. On the lateral foot and eschared area that he has been using Betadine. T olerating HBO well 12/13/2021 upon evaluation today patient appears to be doing pretty well in regard to his foot ulcer. I am actually much more pleased currently that I been with the way the appearance looks today. The wound bed does show some signs of slough and biofilm on the surface of the wound this is minimal I Minna perform debridement the I will do so very carefully due to the fact that to be honest the patient had a significant amount of bleeding in the past being on the Brilinta he had a difficult time clotting when I debrided him previously quite significantly. Fortunately I do not see any signs of active infection at this time which is great news. No fevers, chills, nausea, vomiting, or diarrhea. 12/20/2021 upon evaluation today patient actually appears to be showing some signs of excellent improvement here. I am very pleased with where things stand currently. There does not appear to be any evidence of active infection locally nor systemically which is great news and I do believe that with the hyperbaric oxygen therapy he is actually making excellent progress. 3/9; patient presents for follow-up. He continues to do HBO therapy without issues. He has completed 34 out of 40 sessions. He continues to use Dakin's wet- to-dry to the amputation site and Betadine to the lateral foot wound. he currently denies signs of infection. 01/03/2022 upon evaluation today patient appears to be doing better in regard to his distal foot ulcer. Unfortunately the lateral foot is a different story this area has softened up and is getting need to be debrided away today. I Georgina Peer do this with scissors and forceps and was able to clear that out that will be detailed below. Otherwise I feel like the Dakin's moistened gauze dressing is doing an awesome job for him and he is making significantly improvements here. 01/10/2022 upon  evaluation today patient continues to have some of the necrotic tissue loosen up on the lateral portion of his foot the main wound that we have been treating is actually doing quite well. Overall very pleased with where things stand in general. No fevers, chills, nausea, vomiting, or diarrhea. 01/17/2022 upon evaluation today patient appears to be doing somewhat poorly in regard to the lateral foot wound. The main wound that we initially were seeing him for looks to be doing great. This lateral foot has worsened and unfortunately it appears to potentially have some signs of infection here. I do believe that we may need to see about getting started on some IV antibiotics he is in dialysis so they could potentially do this for him, Monday. Nonetheless I want to try to get that started ASAP. Again I would prefer him be initiated with IV vancomycin and cefepime just as an empiric broad-spectrum until we can get the culture results back at least. Subsequently also think that this should be done sooner rather than later due to the fact that his leg is more swollen and overall this seems to have progressed fairly rapidly since last week I do not want this to get worse. He voiced understanding. 01-24-2022 upon evaluation today patient appears to be doing well with regard to his  main ulcer that we have been seeing him for. I am very pleased with where things stand currently. He still continues to have bleeding if we do any type of aggressive sharp debridement there for him having to be very careful using Dakin's solution along with debridement as I can here in the office. Overall the patient seems to be doing well. Unfortunately the lateral foot wound still has progressed a little bit further. We did get him on antibiotics starting this past Monday I think the drainage looks much better and overall I am seeing improvement but again this was already broken down to a certain degree I am hopeful by next week this will  really start turnaround for [place]. We will also get a send him for an x-ray to evaluate for any signs of osteomyelitis on this lateral foot. 01-31-2022 upon evaluation today patient's wound on the medial left amputation site is actually doing excellent. I am very pleased with where things stand this is significantly smaller and we are headed in the right direction. In regard to the left lateral foot there is still some necrotic tissue to be cleared away although I see evidence of this starting to basically solidify as far as the edges are concerned with how far it spread I do not see anything seeming to want to spread further. I believe this may be close to done as far as any evolving is concerned. 02-07-2022 upon evaluation today patient appears to be doing well with regard to the main amputation site. Unfortunately the secondary site on the left lateral foot is still giving him some trouble here. There is a lot of dried tissue and I do believe that along with the Dakin's we may want to use a contact layer in the base of the wound to try to prevent the bone from drying out because we are down to that point at this time. The patient voiced understanding at this point as well and is happy to do what ever it takes to try to get this better. With that being said I do believe that it is possible he may benefit from a wound VAC. Again the question is the approval process for getting this done obviously we can see what we can do if we get things cleaned a little bit better but right now I think the best option is going to be for [place] to continue with the packing currently. 02-14-2022 upon evaluation today patient presents for follow-up concerning ongoing issues with his foot. He did have the MRI which does show that he has evidence of osteomyelitis of the digit at the fifth location of his foot. Subsequently this does extend down to the base of the digit and into the proximal portion of the metatarsal as well. With  that being said the bone does not appear to be too severely broken down and a lot of the marrow signal is stated to be maintained which is good news. Nonetheless I do believe that he is unfortunately still in a spot where this is precarious and we need to make sure that we are treating this appropriately. He does seem to be doing well with IV antibiotics he also seems to be doing quite well when it comes to the overall appearance of the wound bed at this point. I am happy in that regard. 02-21-2022 upon evaluation today patient appears to be doing well all things considered in regard to his wounds. I do feel like that he is making some  progress here. The wound on the right distal foot where the amputation site is is actually healing nicely I feel like we can switch over to collagen at this point that should do well for him. In regard to the left lateral foot this is actually getting much cleaner he still has some area where there seems to be some pressure getting to the location I think that he would do well with a knee scooter at this point. I discussed that with him he does not seem prone to going that route at this point he understands it can definitely help. 02-28-2022 upon evaluation today patient appears to be doing well currently with regard to the wound on his foot. The main surgical wound is actually significantly improved and the lateral wound is doing better there is still bone exposed but nonetheless this does not appear to be significantly worse than what we have noted previous. In fact I think it looks better than it did last week. My goal is to keep the bone from drying out and subsequently for that reason we are going to go ahead and proceed with the EpiFix which I think is good to be a good option here for him. Over top of the EpiFix we will do a contact layer in order to hopefully keep things from drying out. This dressing will actually stay in place until he comes back. Patient did see  vascular yesterday and states that they did not feel like anything was doing worse still it appears that if it were left up to them they think the ideal thing would be to proceed with an amputation. I explained to the patient that to be honest is not always avoidable but if we can try to avoid amputation that is what we are after. He voiced understanding and he does want to do what ever he can to try to prevent amputation for that reason we are proceeding with the epi cord today. This will be application #1. [id number] upon evaluation today patient appears to be making some progress here regard to the lateral foot. Fortunately there does not appear to be any signs of active infection locally or systemically which is great news. No fevers, chills, nausea, vomiting, or diarrhea. I do think that he is appropriate for second application of EpiCord today. 03-14-2022 upon evaluation today patient's wound still has significant exposure of the bone at this point. He did get crutches he has been trying to use those the past couple of days am not sure that is going so well to be perfectly honest. We did discuss however offloading is important and we even discussed a total contact cast but to be perfectly honest right now and feels like he may need more debridement to clear away some of the bone which is not viable in the end of the foot of the fifth metatarsal but I am unable to really do that here in the office due to his bleeding. I do not know if potentially we could have him seen by podiatry potentially Dr. Merril Abbe to see if there is anything he can do to help in this regard. I am not even certain whether the majority of the metatarsal is even viable to be honest. Either way I do think that he would benefit from surgical debridement or even possibly partial ray amputation in order for [place] to try to keep things moving in the right direction and get this closed. This was all discussed with the patient today.  With that being said I know he wants to avoid a below-knee amputation which is what has previously been recommended. 03-21-2022 upon evaluation today patient appears to be doing well in regard to the medial wound at this is shown signs of excellent improvement. In regard to the lateral wound this is a bit dry compared to what I want to see him Regino Schultze not draining as much but I think that we need to see what we can do about trying to keep this from drying out and keeping it a bit more moist. Patient voiced understanding. Overall I am very pleased otherwise with where we stand. 03-27-2022 upon evaluation today patient appears to be doing well currently in regard to his wound especially on the medial aspect this looks really good the lateral aspect is looking a little bit better to with some healthier appearance to the bone compared to last week to be honest. Fortunately I do not see any signs of active infection locally or systemically at this time which is great news. No fevers, chills, nausea, vomiting, or diarrhea. KERMAN, PFOST ([id number]) [id number] Page 8 of 11 READMISSION 08/28/2022 This is a 56 year old man who is a diabetic on dialysis but not currently on any treatment for it. He has a known history of PAD. He was in this clinic for a protracted period of time this year for a left foot wound with underlying osteomyelitis for which she was treated with hyperbaric oxygen. As I understand things he left the clinic in June with wounds and an unhealed state possibly infected. 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 Unfortunately in September he required a revision of the BKA but he was ultimately admitted to [facility] for a protracted period with bacteremia secondary to group A strep from 9/12-10/12. Marland Kitchen He is recently completed vancomycin he was taking at dialysis He has been left with a nonhealing surgical  wound on his left BKA amputation site. He recently followed with vascular surgery who recommended Santyl and Vashe solution which they have been applying to the wound every day. It is noted that he does have severe PAD in the right lower extremity that is status post transmetatarsal amputation he is going to follow him up in 3 months. He is also seen infectious disease at [facility] who state that he had MRSA treated with cefazolin and vancomycin at hemodialysis. However he is listed as having an allergic reaction to cefazolin including lip swelling therefore he completed vancomycin. He is not felt to require any additional antibiotics. Patient History Information obtained from Patient. Allergies Dilaudid, Percocet, morphine, gabapentin, Lipitor, amoxicillin, chlorhexidine, rosuvastatin Social History Never smoker, Marital Status - Married, Alcohol Use - Never, Drug Use - No History, Caffeine Use - Daily. Medical History Eyes Patient has history of Cataracts - hx Hematologic/Lymphatic Patient has history of Anemia Respiratory Patient has history of Sleep Apnea - not sure he said Cardiovascular Patient has history of Arrhythmia, Congestive Heart Failure, Coronary Artery Disease, Hypertension, Myocardial Infarction - hx Endocrine Patient has history of Type II Diabetes - dx noted Neurologic Patient has history of Neuropathy, Seizure Disorder - hx 2022 Objective Constitutional Patient is hypertensive.. Pulse regular and within target range for patient.Marland Kitchen Respirations regular, non-labored and within target range.. Temperature is normal and within the target range for the patient.Marland Kitchen appears in no distress. Vitals Time Taken: 9:35 AM, Height: 70 in, Source: Stated, Weight: 166  lbs, Source: Stated, BMI: 23.8, Temperature: 98.2 F, Pulse: 71 bpm, Respiratory Rate: 18 breaths/min, Blood Pressure: 161/89 mmHg. Respiratory Respiratory effort is easy and symmetric bilaterally. Rate is normal at rest and  on room air.. Cardiovascular Both his femoral and popliteal pulse are palpable on the left. Psychiatric No evidence of depression, anxiety, or agitation. Calm, cooperative, and communicative. Appropriate interactions and affect.. General Notes: Wound exam; the patient has 2 areas on the incision site of his left BKA stump. 1 on the anterior medial aspect and the other on the far lateral aspect. Both of these wound areas have necrotic debris on the surface requiring subcutaneous debridement hemostasis with silver nitrate and direct pressure. There was no evidence of infection around the wound site at present. No tenderness no purulence. No cultures were done Integumentary (Hair, Skin) Wound #3 status is Open. Original cause of wound was Surgical Injury. The date acquired was: 07/19/2022. 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 The wound measures 11.5cm length x 1.5cm width x 0.3cm depth; 13.548cm^2 area and 4.064cm^3 volume. There is Fat Layer (Subcutaneous Tissue) exposed. There is no tunneling or undermining noted. There is a medium amount of serosanguineous drainage noted. There is medium (34-66%) pink granulation within the wound bed. There is a medium (34-66%) amount of necrotic tissue within the wound bed including Adherent Slough. Assessment Active Problems ICD-10 Non-pressure chronic ulcer of other part of left lower leg with other specified severity ROBERTT, BUDA ([id number]) [id number] Page 9 of 11 Acquired absence of left leg below knee Type 2 diabetes mellitus with diabetic peripheral angiopathy without gangrene Cellulitis of left lower limb Procedures Wound #3 Pre-procedure diagnosis of Wound #3 is a Dehisced Wound located on the Left Amputation Site - Below Knee . There was a Excisional Skin/Subcutaneous Tissue Debridement with a total area of 7.5 sq cm performed by Ricard Dillon, MD. With the following instrument(s): Curette  to remove Viable and Non-Viable tissue/material. Material removed includes Subcutaneous Tissue and Slough and after achieving pain control using Lidocaine 4% T opical Solution. No specimens were taken. A time out was conducted at 10:15, prior to the start of the procedure. A Minimum amount of bleeding was controlled with Pressure. The procedure was tolerated well with a pain level of 0 throughout and a pain level of 0 following the procedure. Post Debridement Measurements: 11.5cm length x 1.5cm width x 0.3cm depth; 4.064cm^3 volume. Character of Wound/Ulcer Post Debridement is improved. Post procedure Diagnosis Wound #3: Same as Pre-Procedure Plan Follow-up Appointments: Return Appointment in 1 week. Home Health: [facility] for wound care. May utilize formulary equivalent dressing for wound treatment orders unless otherwise specified. Home Health Nurse may visit PRN to address patientoos wound care needs. Jackquline Denmark [phone] Bathing/ Shower/ Hygiene: May shower; gently cleanse wound with antibacterial soap, rinse and pat dry prior to dressing wounds Edema Control - Lymphedema / Segmental Compressive Device / Other: Tubigrip double layer applied - to stump and above knee size E WOUND #3: - Amputation Site - Below Knee Wound Laterality: Left Topical: Santyl Collagenase Ointment, 30 (gm), tube 1 x Per Day/30 Days Discharge Instructions: apply nickel thick to wound bed only Prim Dressing: Gauze 1 x Per Day/30 Days ary Discharge Instructions: moistened with normal saline Secondary Dressing: ABD Pad 5x9 (in/in) 1 x Per Day/30 Days Discharge Instructions: Cover with ABD pad Secured With: Medipore T - 66M Medipore H Soft Cloth Surgical T ape ape, 2x2 (in/yd) 1 x Per  Day/30 Days Secured With: Tubigrip Size E, 3.5x10 (in/yds) 1 x Per Day/30 Days Discharge Instructions: Apply 3 Tubigrip E 3-finger-widths below knee to base of toes to secure dressing and/or for swelling. 1.  Postdebridement the wound areas cleaned up quite nicely 2. I thought Santyl that the patient already has an saline wet-to-dry over this change daily was reasonable at this point. I did not think it required any Vashe solution 3. There is no current evidence of infection and he has recently completed his 4 weeks of IV vancomycin at dialysis 4. The patient has known PAD however his femoral and popliteal pulses are easily palpable in the clinic I think the area of the amputation site is well perfused 5. He did not have any open wound on the right foot according the patient we can check that next week. Electronic Signature(s) Signed: 08/28/2022 3:56:39 PM By: Linton Ham MD Entered By: Linton Ham on 08/28/2022 10:58:19 -------------------------------------------------------------------------------- ROS/PFSH Details Patient Name: Date of Service: [name], [name] MES L. 08/28/2022 9:45 A M Medical Record Number: [id number] Patient Account Number: [id number] Date of Birth/Sex: Treating RN: 1966/07/03 (56 y.o. [name] Primary Care Provider: [name] Other Clinician: Referring Provider: Treating Provider/Extender: [address] [place], [place], [place] ([id number]) [phone].pdf Page 10 of 11 Weeks in Treatment: 0 Information Obtained From Patient Eyes Medical History: Positive for: Cataracts - hx Hematologic/Lymphatic Medical History: Positive for: Anemia Respiratory Medical History: Positive for: Sleep Apnea - not sure he said Cardiovascular Medical History: Positive for: Arrhythmia; Congestive Heart Failure; Coronary Artery Disease; Hypertension; Myocardial Infarction - hx Endocrine Medical History: Positive for: Type II Diabetes - dx noted Time with diabetes: states "I am not diabetic" Treated with: Diet Neurologic Medical History: Positive for: Neuropathy; Seizure Disorder - hx 2022 HBO Extended History  Items Eyes: Cataracts Immunizations Pneumococcal Vaccine: Received Pneumococcal Vaccination: No Implantable Devices No devices added Family and Social History Never smoker; Marital Status - Married; Alcohol Use: Never; Drug Use: No History; Caffeine Use: Daily Electronic Signature(s) Signed: 08/28/2022 3:56:39 PM By: Linton Ham MD Signed: 09/01/2022 10:26:37 AM By: [name] RN Entered By: [name] on 08/28/2022 09:38:40 -------------------------------------------------------------------------------- SuperBill Details Patient Name: Date of Service: [name], Ursa MES L. 08/28/2022 Medical Record Number: [id number] Patient Account Number: [id number] MONTERIO, BOB ([id number]) [id number] Page 11 of 11 Date of Birth/Sex: Treating RN: 1966-02-13 (56 y.o. 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.512 Acquired absence of left leg below knee E11.51 Type 2 diabetes mellitus with diabetic peripheral angiopathy without gangrene L03.116 Cellulitis of left lower limb Facility Procedures : CPT4 Code: [id number] Description: 49449 - WOUND CARE VISIT-LEV 3 EST PT Modifier: Quantity: 1 : CPT4 Code: [id number] Description: 46659 - DEB SUBQ TISSUE 20 SQ CM/< ICD-10 Diagnosis Description L97.828 Non-pressure chronic ulcer of other part of left lower leg with other specified s Modifier: everity Quantity: 1 Physician Procedures : CPT4 Code Description Modifier [id number] 99214 - WC PHYS LEVEL 4 - EST PT 25 ICD-10 Diagnosis Description L97.828 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

## 2022-09-02 ENCOUNTER — Ambulatory Visit: Payer: Medicare Other | Admitting: Physician Assistant

## 2022-09-03 DIAGNOSIS — A400 Sepsis due to streptococcus, group A: Secondary | ICD-10-CM | POA: Diagnosis not present

## 2022-09-03 DIAGNOSIS — I132 Hypertensive heart and chronic kidney disease with heart failure and with stage 5 chronic kidney disease, or end stage renal disease: Secondary | ICD-10-CM | POA: Diagnosis not present

## 2022-09-03 DIAGNOSIS — E1122 Type 2 diabetes mellitus with diabetic chronic kidney disease: Secondary | ICD-10-CM | POA: Diagnosis not present

## 2022-09-03 DIAGNOSIS — I959 Hypotension, unspecified: Secondary | ICD-10-CM | POA: Diagnosis not present

## 2022-09-03 DIAGNOSIS — I5020 Unspecified systolic (congestive) heart failure: Secondary | ICD-10-CM | POA: Diagnosis not present

## 2022-09-03 DIAGNOSIS — T8781 Dehiscence of amputation stump: Secondary | ICD-10-CM | POA: Diagnosis not present

## 2022-09-04 ENCOUNTER — Encounter: Payer: Medicare Other | Admitting: Physician Assistant

## 2022-09-04 DIAGNOSIS — I509 Heart failure, unspecified: Secondary | ICD-10-CM | POA: Diagnosis not present

## 2022-09-04 DIAGNOSIS — E1151 Type 2 diabetes mellitus with diabetic peripheral angiopathy without gangrene: Secondary | ICD-10-CM | POA: Diagnosis not present

## 2022-09-04 DIAGNOSIS — E1140 Type 2 diabetes mellitus with diabetic neuropathy, unspecified: Secondary | ICD-10-CM | POA: Diagnosis not present

## 2022-09-04 DIAGNOSIS — I110 Hypertensive heart disease with heart failure: Secondary | ICD-10-CM | POA: Diagnosis not present

## 2022-09-04 DIAGNOSIS — G40909 Epilepsy, unspecified, not intractable, without status epilepticus: Secondary | ICD-10-CM | POA: Diagnosis not present

## 2022-09-04 DIAGNOSIS — L97828 Non-pressure chronic ulcer of other part of left lower leg with other specified severity: Secondary | ICD-10-CM | POA: Diagnosis not present

## 2022-09-04 DIAGNOSIS — T8189XA Other complications of procedures, not elsewhere classified, initial encounter: Secondary | ICD-10-CM | POA: Diagnosis not present

## 2022-09-04 NOTE — Progress Notes (Addendum)
RUSTON, FEDORA ([id number]) [id number] Page 1 of 9 Visit Report for 09/04/2022 Chief Complaint Document Details Patient Name: Date of Service: [name] 09/04/2022 10:15 A M Medical Record Number: [id number] Patient Account Number: [id number] Date of Birth/Sex: Treating RN: November 10, 1965 (56 y.o. [name]) [name] Primary Care Provider: [name] Other Clinician: Referring Provider: Treating Provider/Extender: [name] Information Obtained from: Patient Chief Complaint HBO evaluation for treatment on the left foot diabetic ulcer with osteomyelitis. Patient is currently under our care as well for his foot. 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 09/04/2022 10:15 A M Medical Record Number: [id number] Patient Account Number: [id number] Date of Birth/Sex: Treating RN: 03/30/1966 (56 y.o. [name] Primary Care Provider: [name] Other Clinician: Referring Provider: Treating Provider/Extender: [name] History of Present Illness HPI Description: 10/31/2021 patient presents today for evaluation here in our clinic. He is actually being evaluated for hyperbaric oxygen therapy only. He has been seen at [facility] up to this point he is under the care of the vascular surgery specialty. Subsequently he is also under the care of the hyperbaric center there. With that being said that due to the fact that he is a dialysis patient he was actually recommended to be transferred to [place] for his treatments he actually lives in [place] which is where his dialysis  is. It was a hardship for him to try to get from [place] especially on dialysis days all the way to [facility] get into the facility for his treatment and get back home he was literally spending all day long. He has had just 4 treatments there at [facility] thus far based on what I see in these have not been continued while he awaits approval here in our clinic. Subsequently I do have records for review that will be included in the HPI predetermination review and attached to this chart as well. I will not duplicate that here. 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 Nonetheless he continues to have exposed bone with marked soft tissue swelling compatible with osteomyelitis. This is due to the irregularity of the head of the metatarsal as well. Nonetheless along with having associated cellulitis he is also good to be seen by infectious disease and is currently on antibiotics for this as well. Again that will be detailed in the pretreatment review section which I will attach to this note as well. He does currently have a wound VAC in place and again the wound was not evaluated here in the clinic as that is being completely managed by home health and [facility]. The patient does have a history of diabetes mellitus type 2, hypertension, coronary artery disease, and congestive heart failure. He also has cataracts of both eyes but no evidence of glaucoma on his most recent eye exam. DENIS, CARREON ([id number]) [id number] Page 2 of 9 11/22/2021 we have been seeing this gentleman for hyperbaric oxygen therapy but have not actually evaluated his wound up to this point this was being managed by the wound care and  vascular team I presumed at [facility] at least that is what has been told to be previous. Nonetheless at this point I got a  call from Atlee Abide who is a Librarian, academic at the [facility] vascular clinic with Dr. Durene Fruits and subsequently they were wanting to know if I could take over wound care for this patient. Apparently they do not have the ability for an office debridement which again I completely understand but nonetheless definitely is not an integral part of his healing along with the hyperbarics. Subsequently I discussed with the patient today that I do believe he would benefit going ahead and proceeding with evaluation here in the clinic for that reason I did go ahead and see him today to get things started. There is also been some confusion about the issues with his home health nurse and getting measurements to [facility] for the wound VAC in order to continue his wound VAC therapy. With that being said after I see him today we will make a determination on what to do with wound VAC we can definitely send measurements to [facility] but again the bigger question is whether this is the best way to go or not. 11/28/2021 upon evaluation today patient appears to be doing decently well in regard to his wound. He has been tolerating the dressing changes with the Dakin's moistened gauze dressing which I think is doing a good job. Fortunately I do not see any signs of active infection locally nor systemically at this point. In general I think that he is actually making excellent progress which is great news. 2/17; seen in conjunction with HBO today. 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 Much improved condition of the wound surface. On the lateral foot and eschared area that he has been using Betadine. T olerating HBO well 12/13/2021 upon evaluation today patient appears to be doing pretty well in regard to his foot ulcer. I am actually much more pleased currently that I been with the way the appearance looks today. The wound bed does show some signs of slough and biofilm on the surface of the wound  this is minimal I Minna perform debridement the I will do so very carefully due to the fact that to be honest the patient had a significant amount of bleeding in the past being on the Brilinta he had a difficult time clotting when I debrided him previously quite significantly. Fortunately I do not see any signs of active infection at this time which is great news. No fevers, chills, nausea, vomiting, or diarrhea. 12/20/2021 upon evaluation today patient actually appears to be showing some signs of excellent improvement here. I am very pleased with where things stand currently. There does not appear to be any evidence of active infection locally nor systemically which is great news and I do believe that with the hyperbaric oxygen therapy he is actually making excellent progress. 3/9; patient presents for follow-up. He continues to do HBO therapy without issues. He has completed 34 out of 40 sessions. He continues to use Dakin's wet- to-dry to the amputation site and Betadine to the lateral foot wound. he currently denies signs of infection. 01/03/2022 upon evaluation today patient appears to be doing better in regard to his distal foot ulcer. Unfortunately the lateral foot is a different story this area has softened up and is getting need to be debrided away today. I Georgina Peer do this with scissors and forceps and was able to clear  that out that will be detailed below. Otherwise I feel like the Dakin's moistened gauze dressing is doing an awesome job for him and he is making significantly improvements here. 01/10/2022 upon evaluation today patient continues to have some of the necrotic tissue loosen up on the lateral portion of his foot the main wound that we have been treating is actually doing quite well. Overall very pleased with where things stand in general. No fevers, chills, nausea, vomiting, or diarrhea. 01/17/2022 upon evaluation today patient appears to be doing somewhat poorly in regard to the lateral  foot wound. The main wound that we initially were seeing him for looks to be doing great. This lateral foot has worsened and unfortunately it appears to potentially have some signs of infection here. I do believe that we may need to see about getting started on some IV antibiotics he is in dialysis so they could potentially do this for him, Monday. Nonetheless I want to try to get that started ASAP. Again I would prefer him be initiated with IV vancomycin and cefepime just as an empiric broad-spectrum until we can get the culture results back at least. Subsequently also think that this should be done sooner rather than later due to the fact that his leg is more swollen and overall this seems to have progressed fairly rapidly since last week I do not want this to get worse. He voiced understanding. 01-24-2022 upon evaluation today patient appears to be doing well with regard to his main ulcer that we have been seeing him for. I am very pleased with where things stand currently. He still continues to have bleeding if we do any type of aggressive sharp debridement there for him having to be very careful using Dakin's solution along with debridement as I can here in the office. Overall the patient seems to be doing well. Unfortunately the lateral foot wound still has progressed a little bit further. We did get him on antibiotics starting this past Monday I think the drainage looks much better and overall I am seeing improvement but again this was already broken down to a certain degree I am hopeful by next week this will really start turnaround for [place]. We will also get a send him for an x-ray to evaluate for any signs of osteomyelitis on this lateral foot. 01-31-2022 upon evaluation today patient's wound on the medial left amputation site is actually doing excellent. I am very pleased with where things stand this is significantly smaller and we are headed in the right direction. In regard to the left lateral  foot there is still some necrotic tissue to be cleared away although I see evidence of this starting to basically solidify as far as the edges are concerned with how far it spread I do not see anything seeming to want to spread further. I believe this may be close to done as far as any evolving is concerned. 02-07-2022 upon evaluation today patient appears to be doing well with regard to the main amputation site. Unfortunately the secondary site on the left lateral foot is still giving him some trouble here. There is a lot of dried tissue and I do believe that along with the Dakin's we may want to use a contact layer in the base of the wound to try to prevent the bone from drying out because we are down to that point at this time. The patient voiced understanding at this point as well and is happy to do what ever it  takes to try to get this better. With that being said I do believe that it is possible he may benefit from a wound VAC. Again the question is the approval process for getting this done obviously we can see what we can do if we get things cleaned a little bit better but right now I think the best option is going to be for [place] to continue with the packing currently. 02-14-2022 upon evaluation today patient presents for follow-up concerning ongoing issues with his foot. He did have the MRI which does show that he has evidence of osteomyelitis of the digit at the fifth location of his foot. Subsequently this does extend down to the base of the digit and into the proximal portion of the metatarsal as well. With that being said the bone does not appear to be too severely broken down and a lot of the marrow signal is stated to be maintained which is good news. Nonetheless I do believe that he is unfortunately still in a spot where this is precarious and we need to make sure that we are treating this appropriately. He does seem to be doing well with IV antibiotics he also seems to be doing quite well  when it comes to the overall appearance of the wound bed at this point. I am happy in that regard. 02-21-2022 upon evaluation today patient appears to be doing well all things considered in regard to his wounds. I do feel like that he is making some progress here. The wound on the right distal foot where the amputation site is is actually healing nicely I feel like we can switch over to collagen at this point that should do well for him. In regard to the left lateral foot this is actually getting much cleaner he still has some area where there seems to be some pressure getting to the location I think that he would do well with a knee scooter at this point. I discussed that with him he does not seem prone to going that route at this point he understands it can definitely help. 02-28-2022 upon evaluation today patient appears to be doing well currently with regard to the wound on his foot. The main surgical wound is actually significantly improved and the lateral wound is doing better there is still bone exposed but nonetheless this does not appear to be significantly worse than what we have noted previous. In fact I think it looks better than it did last week. My goal is to keep the bone from drying out and subsequently for that reason we are going to go ahead and proceed with the EpiFix which I think is good to be a good option here for him. Over top of the EpiFix we will do a contact layer in order to hopefully keep things from drying out. This dressing will actually stay in place until he comes back. Patient did see vascular yesterday and states that they did not feel like anything was doing worse still it appears that if it were left up to them they think the ideal thing would be to proceed with an amputation. I explained to the patient that to be honest is not always avoidable but if we can try to avoid amputation that is what we are after. He voiced understanding and he does want to do what ever he  can to try to prevent amputation for that reason we are proceeding with the epi cord today. This will be application #1. [id number] upon  evaluation today patient appears to be making some progress here regard to the lateral foot. Fortunately there does not appear to be any signs of active infection locally or systemically which is great news. No fevers, chills, nausea, vomiting, or diarrhea. I do think that he is appropriate for second application of EpiCord today. 03-14-2022 upon evaluation today patient's wound still has significant exposure of the bone at this point. He did get crutches he has been trying to use those the past couple of days am not sure that is going so well to be perfectly honest. We did discuss however offloading is important and we even discussed a total contact cast but to be perfectly honest right now and feels like he may need more debridement to clear away some of the bone which is not viable in the end of the foot of the fifth metatarsal but I am unable to really do that here in the office due to his bleeding. I do not know if potentially we could have him THURSTON, BRENDLINGER ([id number]) [id number] Page 3 of 9 seen by podiatry potentially Dr. Merril Abbe to see if there is anything he can do to help in this regard. I am not even certain whether the majority of the metatarsal is even viable to be honest. Either way I do think that he would benefit from surgical debridement or even possibly partial ray amputation in order for [place] to try to keep things moving in the right direction and get this closed. This was all discussed with the patient today. With that being said I know he wants to avoid a below-knee amputation which is what has previously been recommended. 03-21-2022 upon evaluation today patient appears to be doing well in regard to the medial wound at this is shown signs of excellent improvement. In regard to the lateral wound this is a bit dry  compared to what I want to see him Regino Schultze not draining as much but I think that we need to see what we can do about trying to keep this from drying out and keeping it a bit more moist. Patient voiced understanding. Overall I am very pleased otherwise with where we stand. 03-27-2022 upon evaluation today patient appears to be doing well currently in regard to his wound especially on the medial aspect this looks really good the lateral aspect is looking a little bit better to with some healthier appearance to the bone compared to last week to be honest. Fortunately I do not see any signs of active infection locally or systemically at this time which is great news. No fevers, chills, nausea, vomiting, or diarrhea. READMISSION 08/28/2022 This is a 56 year old man who is a diabetic on dialysis but not currently on any treatment for it. He has a known history of PAD. He was in this clinic for a protracted period of time this year for a left foot wound with underlying osteomyelitis for which she was treated with hyperbaric oxygen. As I understand things he left the clinic in June with wounds and an unhealed state possibly infected. 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 Unfortunately in September he required a revision of the BKA but he was ultimately admitted to [facility] for a protracted period with bacteremia secondary to group A strep from 9/12-10/12. Marland Kitchen He is recently completed vancomycin he was taking at dialysis He has been left with a nonhealing surgical  wound on his left BKA amputation site. He recently followed with vascular surgery who recommended Santyl and Vashe solution which they have been applying to the wound every day. It is noted that he does have severe PAD in the right lower extremity that is status post transmetatarsal amputation he is going to follow him up in 3 months. He is also seen infectious disease at [facility] who state  that he had MRSA treated with cefazolin and vancomycin at hemodialysis. However he is listed as having an allergic reaction to cefazolin including lip swelling therefore he completed vancomycin. He is not felt to require any additional antibiotics. 09-04-2022 upon evaluation today patient appears to be doing well currently in regard to his wound on the medial aspect of this is amputation site where he had a BKA. On the lateral aspect however he has an area of the incision line that is open that actually goes somewhat deep. We did probe about 4 cm down although I do not feel any bone directly. Fortunately there does not appear to be any signs of active infection locally or systemically at this time. There is some concern here in my estimation about the possibility of infection and I think that he may need some extension of the antibiotics I Georgina Peer probably put him on linezolid as he was previously on vancomycin. Electronic Signature(s) Signed: 09/04/2022 11:02:38 AM By: Worthy Keeler PA-C Entered By: Worthy Keeler on 09/04/2022 11:02:37 -------------------------------------------------------------------------------- Physical Exam Details Patient Name: Date of Service: [name] 09/04/2022 10:15 A M Medical Record Number: [id number] Patient Account Number: [id number] Date of Birth/Sex: Treating RN: 02/19/1966 (56 y.o. [name] Primary Care Provider: [name] Other Clinician: Referring Provider: Treating Provider/Extender: [name] Constitutional Well-nourished and well-hydrated in no acute distress. Respiratory normal breathing without difficulty. Psychiatric this patient is able to make decisions and demonstrates good insight into disease process. Alert and Oriented x 3. pleasant and cooperative. Notes Upon inspection patient's wound bed actually showed signs of doing some good healing as far as the medial aspect of the left  below-knee amputation is concerned. However the lateral aspect has a deeper wound but I think a Hydrofera Blue rope may be beneficial for. Electronic Signature(s) Signed: 09/04/2022 11:03:08 AM By: Worthy Keeler PA-C Entered By: Worthy Keeler on 09/04/2022 [address] Joyice Faster ([id number]) [id number] Page 4 of 9 -------------------------------------------------------------------------------- Physician Orders Details Patient Name: Date of Service: [name] 09/04/2022 10:15 A M Medical Record Number: [id number] Patient Account Number: [id number] Date of Birth/Sex: Treating RN: 11-Jun-1966 (56 y.o. [name]) [name] Primary Care Provider: [name] Other Clinician: Referring Provider: Treating Provider/Extender: [name] Verbal / Phone Orders: No Diagnosis Coding ICD-10 Coding Code Description [phone] Non-pressure chronic ulcer of other part of left lower leg with other specified severity Z89.512 Acquired absence of left leg below knee E11.51 Type 2 diabetes mellitus with diabetic peripheral angiopathy without gangrene L03.116 Cellulitis of left lower limb Follow-up Appointments Return Appointment in 1 week. [place] for wound care. May utilize formulary equivalent dressing for wound treatment orders unless otherwise specified. Home Health Nurse may visit PRN to address patients wound care needs. Jackquline [place] [phone] Bathing/ Shower/ Hygiene May shower; gently cleanse wound with antibacterial soap, rinse and pat dry prior to dressing wounds Edema Control - Lymphedema / Segmental Compressive Device / Other Tubigrip double layer applied - to  stump and above knee Wound Treatment Wound #3 - Amputation Site - Below Knee Wound Laterality: Left Topical: Santyl Collagenase Ointment, 30 (gm), tube 1 x Per Day/30 Days Discharge Instructions: apply nickel thick to wound bed only Prim  Dressing: Gauze 1 x Per Day/30 Days ary Discharge Instructions: moistened with normal saline over santyl Prim Dressing: Hydrofera Blue Classic Foam Rope Dressing, 9x6 (mm/in) 1 x Per Day/30 Days ary Discharge Instructions: cut to 1/4 , pointed tip, insert into lateral aspect of wound , change every other day Secondary Dressing: ABD Pad 5x9 (in/in) 1 x Per Day/30 Days Discharge Instructions: Cover with ABD pad Secured With: Medipore T - 20M Medipore H Soft Cloth Surgical T ape ape, 2x2 (in/yd) 1 x Per Day/30 Days Secured With: Tubigrip Size E, 3.5x10 (in/yds) 1 x Per Day/30 Days Discharge Instructions: Apply 3 Tubigrip E 3-finger-widths below knee to base of toes to secure dressing and/or for swelling. Patient Medications llergies: Dilaudid, Percocet, morphine, gabapentin, Lipitor, amoxicillin, chlorhexidine, rosuvastatin A Notifications Medication Indication Start End 09/04/2022 linezolid DOSE 1 - oral 600 mg tablet - 1 tablet oral taken 2 times per day for 14 days Electronic Signature(s) RYLER, LASKOWSKI ([id number]) [id number] Page 5 of 9 Signed: 09/05/2022 1:14:49 PM By: [name] RN Signed: 09/05/2022 2:32:55 PM By: Worthy Keeler PA-C Previous Signature: 09/04/2022 11:06:01 AM Version By: Worthy Keeler PA-C Entered By: [name] on 09/04/2022 14:25:13 -------------------------------------------------------------------------------- Problem List Details Patient Name: Date of Service: [name], [name] MES L. 09/04/2022 10:15 A M Medical Record Number: [id number] Patient Account Number: [id number] Date of Birth/Sex: Treating RN: 06/29/1966 (56 y.o. [name] Primary Care Provider: [name] Other Clinician: Referring Provider: Treating Provider/Extender: [name] Active Problems ICD-10 Encounter Code Description Active Date MDM Diagnosis [phone] Non-pressure chronic ulcer of other part of left lower  leg with other specified 08/28/2022 No Yes severity Z89.512 Acquired absence of left leg below knee 08/28/2022 No Yes E11.51 Type 2 diabetes mellitus with diabetic peripheral angiopathy without gangrene 08/28/2022 No Yes L03.116 Cellulitis of left lower limb 08/28/2022 No Yes Inactive Problems Resolved Problems Electronic Signature(s) Signed: 09/04/2022 10:16:48 AM By: Worthy Keeler PA-C Entered By: Worthy Keeler on 09/04/2022 10:16:48 Progress Note Details -------------------------------------------------------------------------------- Valla Leaver ([id number]) [id number] Page 6 of 9 Patient Name: Date of Service: [name] 09/04/2022 10:15 A M Medical Record Number: [id number] Patient Account Number: [id number] Date of Birth/Sex: Treating RN: 02-10-1966 (56 y.o. [name]) [name] Primary Care Provider: [name] Other Clinician: Referring Provider: Treating Provider/Extender: [name] Subjective Chief Complaint Information obtained from Patient HBO evaluation for treatment on the left foot diabetic ulcer with osteomyelitis. Patient is currently under our care as well for his foot. 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 He is actually being evaluated for hyperbaric oxygen therapy only. He has been seen at [facility] up to this point he is under the care of the vascular surgery specialty. Subsequently he is also under the care of the hyperbaric center there. With that being said that due to the fact that he is a dialysis patient he was actually recommended to be transferred to [place] for his treatments he actually lives in [place] which is where his dialysis is. It was a hardship for him to try  to get from [place] especially on dialysis days all the way to  [facility] get into the facility for his treatment and get back home he was literally spending all day long. He has had just 4 treatments there at [facility] thus far based on what I see in these have not been continued while he awaits approval here in our clinic. Subsequently I do have records for review that will be included in the HPI predetermination review and attached to this chart as well. I will not duplicate that here. 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 Nonetheless he continues to have exposed bone with marked soft tissue swelling compatible with osteomyelitis. This is due to the irregularity of the head of the metatarsal as well. Nonetheless along with having associated cellulitis he is also good to be seen by infectious disease and is currently on antibiotics for this as well. Again that will be detailed in the pretreatment review section which I will attach to this note as well. He does currently have a wound VAC in place and again the wound was not evaluated here in the clinic as that is being completely managed by home health and [facility]. The patient does have a history of diabetes mellitus type 2, hypertension, coronary artery disease, and congestive heart failure. He also has cataracts of both eyes but no evidence of glaucoma on his most recent eye exam. 11/22/2021 we have been seeing this gentleman for hyperbaric oxygen therapy but have not actually evaluated his wound up to this point this was being managed by the wound care and vascular team I presumed at [facility] at least that is what has been told to be previous. Nonetheless at this point I got a call from Atlee Abide who is a Librarian, academic at the [facility] vascular clinic with Dr. Durene Fruits and subsequently they were wanting to know if I could take over wound care for this  patient. Apparently they do not have the ability for an office debridement which again I completely understand but nonetheless definitely is not an integral part of his healing along with the hyperbarics. Subsequently I discussed with the patient today that I do believe he would benefit going ahead and proceeding with evaluation here in the clinic for that reason I did go ahead and see him today to get things started. There is also been some confusion about the issues with his home health nurse and getting measurements to [facility] for the wound VAC in order to continue his wound VAC therapy. With that being said after I see him today we will make a determination on what to do with wound VAC we can definitely send measurements to [facility] but again the bigger question is whether this is the best way to go or not. 11/28/2021 upon evaluation today patient appears to be doing decently well in regard to his wound. He has been tolerating the dressing changes with the Dakin's moistened gauze dressing which I think is doing a good job. Fortunately I do not see any signs of active infection locally nor systemically at this point. In general I think that he is actually making excellent progress which is great news. 2/17; seen in conjunction with HBO today. 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 Much improved condition of the wound surface.  On the lateral foot and eschared area that he has been using Betadine. T olerating HBO well 12/13/2021 upon evaluation today patient appears to be doing pretty well in regard to his foot ulcer. I am actually much more pleased currently that I been with the way the appearance looks today. The wound bed does show some signs of slough and biofilm on the surface of the wound this is minimal I Minna perform debridement the I will do so very carefully due to the fact that to be honest the patient had a significant amount of bleeding in the past being on the  Brilinta he had a difficult time clotting when I debrided him previously quite significantly. Fortunately I do not see any signs of active infection at this time which is great news. No fevers, chills, nausea, vomiting, or diarrhea. 12/20/2021 upon evaluation today patient actually appears to be showing some signs of excellent improvement here. I am very pleased with where things stand currently. There does not appear to be any evidence of active infection locally nor systemically which is great news and I do believe that with the hyperbaric oxygen therapy he is actually making excellent progress. 3/9; patient presents for follow-up. He continues to do HBO therapy without issues. He has completed 34 out of 40 sessions. He continues to use Dakin's wet- to-dry to the amputation site and Betadine to the lateral foot wound. he currently denies signs of infection. 01/03/2022 upon evaluation today patient appears to be doing better in regard to his distal foot ulcer. Unfortunately the lateral foot is a different story this area has softened up and is getting need to be debrided away today. I Georgina Peer do this with scissors and forceps and was able to clear that out that will be detailed below. Otherwise I feel like the Dakin's moistened gauze dressing is doing an awesome job for him and he is making significantly improvements here. 01/10/2022 upon evaluation today patient continues to have some of the necrotic tissue loosen up on the lateral portion of his foot the main wound that we have been treating is actually doing quite well. Overall very pleased with where things stand in general. No fevers, chills, nausea, vomiting, or diarrhea. 01/17/2022 upon evaluation today patient appears to be doing somewhat poorly in regard to the lateral foot wound. The main wound that we initially were seeing him for looks to be doing great. This lateral foot has worsened and unfortunately it appears to potentially have some signs of  infection here. I do believe that we may need to see about getting started on some IV antibiotics he is in dialysis so they could potentially do this for him, Monday. Nonetheless I want to try to get that started ASAP. Again I would prefer him be initiated with IV vancomycin and cefepime just as an empiric broad-spectrum until we can get the culture results back at least. Subsequently also think that this should be done sooner rather than later due to the fact that his leg is more swollen and overall this seems to have progressed fairly rapidly since last week I do not want this to get worse. He voiced understanding. 01-24-2022 upon evaluation today patient appears to be doing well with regard to his main ulcer that we have been seeing him for. I am very pleased with where things stand currently. He still continues to have bleeding if we do any type of aggressive sharp debridement there for him having to be very careful using Dakin's solution  along with debridement as I can here in the office. Overall the patient seems to be doing well. Unfortunately the lateral foot wound still has progressed a little bit further. We did get him on antibiotics starting this past Monday I think the drainage looks much better and overall I am seeing improvement but again this was already broken down to a certain degree I am hopeful by next week this will really start turnaround for [place]. We will also get a send him for an x-ray to evaluate for any signs of osteomyelitis on this lateral foot. 01-31-2022 upon evaluation today patient's wound on the medial left amputation site is actually doing excellent. I am very pleased with where things stand this is significantly smaller and we are headed in the right direction. In regard to the left lateral foot there is still some necrotic tissue to be cleared away although I see evidence of this starting to basically solidify as far as the edges are concerned with how far it spread I do  not see anything seeming to want to spread further. I believe this may be close to done as far as any evolving is concerned. 02-07-2022 upon evaluation today patient appears to be doing well with regard to the main amputation site. Unfortunately the secondary site on the left lateral foot is still giving him some trouble here. There is a lot of dried tissue and I do believe that along with the Dakin's we may want to use a contact layer in the base of the wound to try to prevent the bone from drying out because we are down to that point at this time. The patient voiced understanding at this point as KWANE, ROHL ([id number]) [id number] Page 7 of 9 well and is happy to do what ever it takes to try to get this better. With that being said I do believe that it is possible he may benefit from a wound VAC. Again the question is the approval process for getting this done obviously we can see what we can do if we get things cleaned a little bit better but right now I think the best option is going to be for [place] to continue with the packing currently. 02-14-2022 upon evaluation today patient presents for follow-up concerning ongoing issues with his foot. He did have the MRI which does show that he has evidence of osteomyelitis of the digit at the fifth location of his foot. Subsequently this does extend down to the base of the digit and into the proximal portion of the metatarsal as well. With that being said the bone does not appear to be too severely broken down and a lot of the marrow signal is stated to be maintained which is good news. Nonetheless I do believe that he is unfortunately still in a spot where this is precarious and we need to make sure that we are treating this appropriately. He does seem to be doing well with IV antibiotics he also seems to be doing quite well when it comes to the overall appearance of the wound bed at this point. I am happy in that  regard. 02-21-2022 upon evaluation today patient appears to be doing well all things considered in regard to his wounds. I do feel like that he is making some progress here. The wound on the right distal foot where the amputation site is is actually healing nicely I feel like we can switch over to collagen at this point that should do well for  him. In regard to the left lateral foot this is actually getting much cleaner he still has some area where there seems to be some pressure getting to the location I think that he would do well with a knee scooter at this point. I discussed that with him he does not seem prone to going that route at this point he understands it can definitely help. 02-28-2022 upon evaluation today patient appears to be doing well currently with regard to the wound on his foot. The main surgical wound is actually significantly improved and the lateral wound is doing better there is still bone exposed but nonetheless this does not appear to be significantly worse than what we have noted previous. In fact I think it looks better than it did last week. My goal is to keep the bone from drying out and subsequently for that reason we are going to go ahead and proceed with the EpiFix which I think is good to be a good option here for him. Over top of the EpiFix we will do a contact layer in order to hopefully keep things from drying out. This dressing will actually stay in place until he comes back. Patient did see vascular yesterday and states that they did not feel like anything was doing worse still it appears that if it were left up to them they think the ideal thing would be to proceed with an amputation. I explained to the patient that to be honest is not always avoidable but if we can try to avoid amputation that is what we are after. He voiced understanding and he does want to do what ever he can to try to prevent amputation for that reason we are proceeding with the epi cord today.  This will be application #1. [id number] upon evaluation today patient appears to be making some progress here regard to the lateral foot. Fortunately there does not appear to be any signs of active infection locally or systemically which is great news. No fevers, chills, nausea, vomiting, or diarrhea. I do think that he is appropriate for second application of EpiCord today. 03-14-2022 upon evaluation today patient's wound still has significant exposure of the bone at this point. He did get crutches he has been trying to use those the past couple of days am not sure that is going so well to be perfectly honest. We did discuss however offloading is important and we even discussed a total contact cast but to be perfectly honest right now and feels like he may need more debridement to clear away some of the bone which is not viable in the end of the foot of the fifth metatarsal but I am unable to really do that here in the office due to his bleeding. I do not know if potentially we could have him seen by podiatry potentially Dr. Merril Abbe to see if there is anything he can do to help in this regard. I am not even certain whether the majority of the metatarsal is even viable to be honest. Either way I do think that he would benefit from surgical debridement or even possibly partial ray amputation in order for [place] to try to keep things moving in the right direction and get this closed. This was all discussed with the patient today. With that being said I know he wants to avoid a below-knee amputation which is what has previously been recommended. 03-21-2022 upon evaluation today patient appears to be doing well in regard to the medial  wound at this is shown signs of excellent improvement. In regard to the lateral wound this is a bit dry compared to what I want to see him Regino Schultze not draining as much but I think that we need to see what we can do about trying to keep this from drying out and keeping it a bit  more moist. Patient voiced understanding. Overall I am very pleased otherwise with where we stand. 03-27-2022 upon evaluation today patient appears to be doing well currently in regard to his wound especially on the medial aspect this looks really good the lateral aspect is looking a little bit better to with some healthier appearance to the bone compared to last week to be honest. Fortunately I do not see any signs of active infection locally or systemically at this time which is great news. No fevers, chills, nausea, vomiting, or diarrhea. READMISSION 08/28/2022 This is a 56 year old man who is a diabetic on dialysis but not currently on any treatment for it. He has a known history of PAD. He was in this clinic for a protracted period of time this year for a left foot wound with underlying osteomyelitis for which she was treated with hyperbaric oxygen. As I understand things he left the clinic in June with wounds and an unhealed state possibly infected. 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 Unfortunately in September he required a revision of the BKA but he was ultimately admitted to [facility] for a protracted period with bacteremia secondary to group A strep from 9/12-10/12. Marland Kitchen He is recently completed vancomycin he was taking at dialysis He has been left with a nonhealing surgical wound on his left BKA amputation site. He recently followed with vascular surgery who recommended Santyl and Vashe solution which they have been applying to the wound every day. It is noted that he does have severe PAD in the right lower extremity that is status post transmetatarsal amputation he is going to follow him up in 3 months. He is also seen infectious disease at [facility] who state that he had MRSA treated with cefazolin and vancomycin at hemodialysis. However he is listed as having an allergic reaction to cefazolin including lip swelling therefore he  completed vancomycin. He is not felt to require any additional antibiotics. 09-04-2022 upon evaluation today patient appears to be doing well currently in regard to his wound on the medial aspect of this is amputation site where he had a BKA. On the lateral aspect however he has an area of the incision line that is open that actually goes somewhat deep. We did probe about 4 cm down although I do not feel any bone directly. Fortunately there does not appear to be any signs of active infection locally or systemically at this time. There is some concern here in my estimation about the possibility of infection and I think that he may need some extension of the antibiotics I Georgina Peer probably put him on linezolid as he was previously on vancomycin. Objective Constitutional Well-nourished and well-hydrated in no acute distress. Vitals Time Taken: 10:22 AM, Height: 70 in, Weight: 166 lbs, BMI: 23.8, Temperature: 97.5 F, Pulse: 67 bpm, Respiratory Rate: 18 breaths/min, Blood Pressure: 152/92 mmHg. Respiratory normal breathing without difficulty. Psychiatric this patient is able to make decisions and demonstrates good insight into disease process. Alert and Oriented x 3. pleasant and cooperative. EBENEZER, MCCASKEY ([id number]) [id number] Page 8  of 9 General Notes: Upon inspection patient's wound bed actually showed signs of doing some good healing as far as the medial aspect of the left below-knee amputation is concerned. However the lateral aspect has a deeper wound but I think a Hydrofera Blue rope may be beneficial for. Integumentary (Hair, Skin) Wound #3 status is Open. Original cause of wound was Surgical Injury. The date acquired was: 07/19/2022. The wound has been in treatment 1 weeks. 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 The wound measures 11.5cm length x 1.5cm width x 4cm depth; 13.548cm^2 area and 54.192cm^3 volume. There is Fat Layer  (Subcutaneous Tissue) exposed. There is no tunneling or undermining noted. There is a medium amount of serosanguineous drainage noted. There is medium (34-66%) pink granulation within the wound bed. There is a medium (34-66%) amount of necrotic tissue within the wound bed including Adherent Slough. 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 Home Health: [facility] for wound care. May utilize formulary equivalent dressing for wound treatment orders unless otherwise specified. Home Health Nurse may visit PRN to address patientoos wound care needs. Jackquline Denmark [phone] Bathing/ Shower/ Hygiene: May shower; gently cleanse wound with antibacterial soap, rinse and pat dry prior to dressing wounds Edema Control - Lymphedema / Segmental Compressive Device / Other: Tubigrip double layer applied - to stump and above knee The following medication(s) was prescribed: linezolid oral 600 mg tablet 1 1 tablet oral taken 2 times per day for 14 days starting 09/04/2022 WOUND #3: - Amputation Site - Below Knee Wound Laterality: Left Topical: Santyl Collagenase Ointment, 30 (gm), tube 1 x Per Day/30 Days Discharge Instructions: apply nickel thick to wound bed only Prim Dressing: Gauze 1 x Per Day/30 Days ary Discharge Instructions: moistened with normal saline Secondary Dressing: ABD Pad 5x9 (in/in) 1 x Per Day/30 Days Discharge Instructions: Cover with ABD pad Secured With: Medipore T - 54M Medipore H Soft Cloth Surgical T ape ape, 2x2 (in/yd) 1 x Per Day/30 Days Secured With: Tubigrip Size D, 3x10 (in/yd) 1 x Per Day/30 Days 1. I am going to recommend currently based on what we are seeing that we go ahead  and initiate treatment with the patient regarding the wound currently. This is going to include the use of Hydrofera Blue rope cut into a fourth over the lateral aspect. This does go somewhat deep and would not good be able to get the whole way down but I think keeping this open will allow at least for some of the fluid to be expressed out and not collect down in which I think is good to be ideal. 2. I am also can recommend that we continue with the Santyl followed by the Dakin's moistened gauze dressing to the main dehisced amputation site which actually seems to be making some good progress in my opinion. 3. Regular continue with the Tubigrip size D which I think is also doing quite well. We will see patient back for reevaluation in 1 week here in the clinic. If anything worsens or changes patient will contact our office for additional recommendations. Electronic Signature(s) Signed: 09/04/2022 11:06:15 AM By: Worthy Keeler PA-C Previous Signature: 09/04/2022 11:03:56 AM Version By: Worthy Keeler PA-C Entered By: Melburn Hake,  Tambra Muller on 09/04/2022 11:06:15 [place], MAXIMIANO LOTT ([id number]) [id number] Page 9 of 9 -------------------------------------------------------------------------------- SuperBill Details Patient Name: Date of Service: [name] 09/04/2022 Medical Record Number: [id number] Patient Account Number: [id number] Date of Birth/Sex: Treating RN: [birth year] (56 y.o. [name]) [name] Primary Care Provider: [name] Other Clinician: Referring Provider: Treating Provider/Extender: [name] Diagnosis Coding ICD-10 Codes Code Description [phone] Non-pressure chronic ulcer of other part of left lower leg with other specified severity Z89.512 Acquired absence of left leg below knee E11.51 Type 2 diabetes mellitus with diabetic peripheral angiopathy without gangrene L03.116 Cellulitis of left lower  limb Facility Procedures : CPT4 Code: [id number] Description: 50037 - WOUND CARE VISIT-LEV 2 EST PT Modifier: Quantity: 1 Physician Procedures : CPT4 Code Description Modifier [id number] - WC PHYS LEVEL 4 - EST PT ICD-10 Diagnosis Description L97.828 Non-pressure chronic ulcer of other part of left lower leg with other specified severity Z89.512 Acquired absence of left leg below knee E11.51  Type 2 diabetes mellitus with diabetic peripheral angiopathy without gangrene L03.116 Cellulitis of left lower limb Quantity: 1 Electronic Signature(s) Signed: 09/04/2022 11:04:34 AM By: Worthy Keeler PA-C Entered By: Worthy Keeler on 09/04/2022 11:04:34

## 2022-09-05 DIAGNOSIS — E1122 Type 2 diabetes mellitus with diabetic chronic kidney disease: Secondary | ICD-10-CM | POA: Diagnosis not present

## 2022-09-05 DIAGNOSIS — I96 Gangrene, not elsewhere classified: Secondary | ICD-10-CM | POA: Diagnosis not present

## 2022-09-05 DIAGNOSIS — I132 Hypertensive heart and chronic kidney disease with heart failure and with stage 5 chronic kidney disease, or end stage renal disease: Secondary | ICD-10-CM | POA: Diagnosis not present

## 2022-09-05 DIAGNOSIS — D631 Anemia in chronic kidney disease: Secondary | ICD-10-CM | POA: Diagnosis not present

## 2022-09-05 DIAGNOSIS — I5020 Unspecified systolic (congestive) heart failure: Secondary | ICD-10-CM | POA: Diagnosis not present

## 2022-09-05 DIAGNOSIS — I959 Hypotension, unspecified: Secondary | ICD-10-CM | POA: Diagnosis not present

## 2022-09-05 DIAGNOSIS — A400 Sepsis due to streptococcus, group A: Secondary | ICD-10-CM | POA: Diagnosis not present

## 2022-09-05 DIAGNOSIS — N186 End stage renal disease: Secondary | ICD-10-CM | POA: Diagnosis not present

## 2022-09-05 DIAGNOSIS — T8781 Dehiscence of amputation stump: Secondary | ICD-10-CM | POA: Diagnosis not present

## 2022-09-05 DIAGNOSIS — Z992 Dependence on renal dialysis: Secondary | ICD-10-CM | POA: Diagnosis not present

## 2022-09-05 DIAGNOSIS — N2581 Secondary hyperparathyroidism of renal origin: Secondary | ICD-10-CM | POA: Diagnosis not present

## 2022-09-05 NOTE — Progress Notes (Signed)
Ahtanum, [place] L ([id number]) [id number] Page 1 of 9 Visit Report for 09/04/2022 Arrival Information Details Patient Name: Date of Service: [name] MES L. 09/04/2022 10:15 A M Medical Record Number: [id number] Patient Account Number: [id number] Date of Birth/Sex: Treating RN: 28-Sep-1966 (56 y.o. [name]) [name] Primary Care Barbar Brede: [name] Other Clinician: Referring Timarion Agcaoili: Treating Arcenia Scarbro/Extender: [name] Visit Information History Since Last Visit All ordered tests and consults were completed: No Patient Arrived: Wheel Chair Added or deleted any medications: No Arrival Time: 10:21 Any new allergies or adverse reactions: No Accompanied By: wife Had a fall or experienced change in No Transfer Assistance: None activities of daily living that may affect Patient Identification Verified: Yes risk of falls: Secondary Verification Process Completed: Yes Signs or symptoms of abuse/neglect since last visito No Patient Requires Transmission-Based Precautions: No Hospitalized since last visit: No Patient Has Alerts: No Implantable device outside of the clinic excluding No cellular tissue based products placed in the center since last visit: Has Dressing in Place as Prescribed: Yes Pain Present Now: No Electronic Signature(s) Signed: 09/05/2022 1:14:49 PM By: [name] RN Entered By: [name] on 09/04/2022 10:21:47 -------------------------------------------------------------------------------- Clinic Level of Care Assessment Details Patient Name: Date of Service: [name] MES L. 09/04/2022 10:15 A M Medical Record Number: [id number] Patient Account Number: [id number] Date of Birth/Sex: Treating RN: 1965/12/26 (56 y.o. [name]) [name] Primary Care Khadeja Abt: [name] Other Clinician: Referring Amarien Carne: Treating [name]/Extender: [name] Clinic  Level of Care Assessment Items TOOL 4 Quantity Score X- 1 0 Use when only an EandM is performed on FOLLOW-UP visit ASSESSMENTS - Nursing Assessment / Reassessment X- 1 10 Reassessment of Co-morbidities (includes updates in patient status) X- 1 5 Reassessment of Adherence to Treatment Plan CORLISS, LAMARTINA ([id number]) [id number] Page 2 of 9 ASSESSMENTS - Wound and Skin A ssessment / Reassessment X - Simple Wound Assessment / Reassessment - one wound 1 5 []  - 0 Complex Wound Assessment / Reassessment - multiple wounds []  - 0 Dermatologic / Skin Assessment (not related to wound area) ASSESSMENTS - Focused Assessment []  - 0 Circumferential Edema Measurements - multi extremities []  - 0 Nutritional Assessment / Counseling / Intervention []  - 0 Lower Extremity Assessment (monofilament, tuning fork, pulses) []  - 0 Peripheral Arterial Disease Assessment (using hand held doppler) ASSESSMENTS - Ostomy and/or Continence Assessment and Care []  - 0 Incontinence Assessment and Management []  - 0 Ostomy Care Assessment and Management (repouching, etc.) PROCESS - Coordination of Care X - Simple Patient / Family Education for ongoing care 1 15 []  - 0 Complex (extensive) Patient / Family Education for ongoing care []  - 0 Staff obtains Programmer, systems, Records, T Results / Process Orders est []  - 0 Staff telephones HHA, Nursing Homes / Clarify orders / etc []  - 0 Routine Transfer to another Facility (non-emergent condition) []  - 0 Routine Hospital Admission (non-emergent condition) []  - 0 New Admissions / Biomedical engineer / Ordering NPWT Apligraf, etc. , []  - 0 Emergency Hospital Admission (emergent condition) X- 1 10 Simple Discharge Coordination []  - 0 Complex (extensive) Discharge Coordination PROCESS - Special Needs []  - 0 Pediatric / Minor Patient Management []  - 0 Isolation Patient Management []  - 0 Hearing / Language / Visual special needs []  -  0 Assessment of Community assistance (transportation, D/C planning, etc.) []  - 0 Additional assistance / Altered mentation []  - 0 Support Surface(s) Assessment (bed, cushion, seat,  etc.) INTERVENTIONS - Wound Cleansing / Measurement X - Simple Wound Cleansing - one wound 1 5 []  - 0 Complex Wound Cleansing - multiple wounds X- 1 5 Wound Imaging (photographs - any number of wounds) []  - 0 Wound Tracing (instead of photographs) X- 1 5 Simple Wound Measurement - one wound []  - 0 Complex Wound Measurement - multiple wounds INTERVENTIONS - Wound Dressings X - Small Wound Dressing one or multiple wounds 1 10 []  - 0 Medium Wound Dressing one or multiple wounds []  - 0 Large Wound Dressing one or multiple wounds []  - 0 Application of Medications - topical []  - 0 Application of Medications - injection INTERVENTIONS - Miscellaneous []  - 0 External ear exam Tamer, Sael L ([id number]) [id number] Page 3 of 9 []  - 0 Specimen Collection (cultures, biopsies, blood, body fluids, etc.) []  - 0 Specimen(s) / Culture(s) sent or taken to Lab for analysis []  - 0 Patient Transfer (multiple staff / [name] Lift / Similar devices) []  - 0 Simple Staple / Suture removal (25 or less) []  - 0 Complex Staple / Suture removal (26 or more) []  - 0 Hypo / Hyperglycemic Management (close monitor of Blood Glucose) []  - 0 Ankle / Brachial Index (ABI) - do not check if billed separately X- 1 5 Vital Signs Has the patient been seen at the hospital within the last three years: Yes Total Score: 75 Level Of Care: New/Established - Level 2 Electronic Signature(s) Signed: 09/05/2022 1:14:49 PM By: [name] RN Entered By: [name] on 09/04/2022 10:43:19 -------------------------------------------------------------------------------- Encounter Discharge Information Details Patient Name: Date of Service: [name], [name] MES L. 09/04/2022 10:15 A M Medical Record Number:  [id number] Patient Account Number: [id number] Date of Birth/Sex: Treating RN: Jul 01, 1966 (56 y.o. [name] Primary Care Kennedie Pardoe: [name] Other Clinician: Referring Lindel Marcell: Treating Jayley Hustead/Extender: [name] Encounter Discharge Information Items Discharge Condition: Stable Ambulatory Status: Wheelchair Discharge Destination: Home Transportation: Private Auto Accompanied By: wife Schedule Follow-up Appointment: Yes Clinical Summary of Care: Electronic Signature(s) Signed: 09/05/2022 1:14:49 PM By: [name] RN Entered By: [name] on 09/04/2022 10:44:15 Lower Extremity Assessment Details -------------------------------------------------------------------------------- Valla Leaver ([id number]) [id number] Page 4 of 9 Patient Name: Date of Service: [name] MES L. 09/04/2022 10:15 A M Medical Record Number: [id number] Patient Account Number: [id number] Date of Birth/Sex: Treating RN: 1966/04/13 (56 y.o. [name]) [name] Primary Care Ethie Curless: [name] Other Clinician: Referring Cam Harnden: Treating Aliyyah Riese/Extender: [name] Electronic Signature(s) Signed: 09/05/2022 1:14:49 PM By: [name] RN Entered By: [name] on 09/04/2022 10:28:51 -------------------------------------------------------------------------------- Multi Wound Chart Details Patient Name: Date of Service: [name], [name] MES L. 09/04/2022 10:15 A M Medical Record Number: [id number] Patient Account Number: [id number] Date of Birth/Sex: Treating RN: [birth year] (56 y.o. [name]) [name] Primary Care Lurlie Wigen: [name] Other Clinician: Referring Elvin Mccartin: Treating Ane Conerly/Extender: [name] Vital Signs Height(in): 70 Pulse(bpm): 23 Weight(lbs): 166 Blood Pressure(mmHg): 152/92 Body Mass Index(BMI): 23.8 Temperature(F):  97.5 Respiratory Rate(breaths/min): 18 [3:Photos:] [N/A:N/A] Left Amputation Site - Below Knee N/A N/A Wound Location: Surgical Injury N/A N/A Wounding Event: Dehisced Wound N/A N/A Primary Etiology: Cataracts, Anemia, Sleep Apnea, N/A N/A Comorbid History: Arrhythmia, Congestive Heart Failure, Coronary Artery Disease, Hypertension, Myocardial Infarction, Type II Diabetes, Neuropathy, Seizure Disorder 07/19/2022 N/A N/A Date Acquired: 1 N/A N/A Weeks of Treatment: Open N/A N/A Wound Status: No N/A N/A Wound Recurrence: 11.5x1.5x4 N/A N/A Measurements  L x W x D (cm) 13.548 N/A N/A A (cm) : rea 54.192 N/A N/A Volume (cm) : 0.00% N/A N/A % Reduction in Area: -1233.50% N/A N/A % Reduction in Volume: Full Thickness Without Exposed N/A N/A Classification: Support Structures Medium N/A N/A Exudate Amount: Serosanguineous N/A N/A Exudate Type: red, brown N/A N/A Exudate Color: Medium (34-66%) N/A N/A Granulation Amount: Pink N/A N/A Granulation Quality: Winiecki, Chantry L ([id number]) [id number] Page 5 of 9 Medium (34-66%) N/A N/A Necrotic Amount: Fat Layer (Subcutaneous Tissue): Yes N/A N/A Exposed Structures: Fascia: No Tendon: No Muscle: No Joint: No Bone: No None N/A N/A Epithelialization: Treatment Notes Electronic Signature(s) Signed: 09/05/2022 1:14:49 PM By: [name] RN Entered By: [name] on 09/04/2022 10:29:03 -------------------------------------------------------------------------------- [place] Details Patient Name: Date of Service: [name], [name] MES L. 09/04/2022 10:15 A M Medical Record Number: [id number] Patient Account Number: [id number] Date of Birth/Sex: Treating RN: Jun 04, 1966 (56 y.o. [name]) [name] Primary Care Shaine Mount: [name] Other Clinician: Referring Epifania Littrell: Treating Malu Pellegrini/Extender: [name] Active Inactive Abuse / Safety /  Falls / Self Care Management Nursing Diagnoses: Potential for injury related to falls Goals: Patient will remain injury free related to falls Date Initiated: 08/28/2022 Target Resolution Date: 09/27/2022 Goal Status: Active Interventions: Assess Activities of Daily Living upon admission and as needed Assess fall risk on admission and as needed Assess: immobility, friction, shearing, incontinence upon admission and as needed Assess impairment of mobility on admission and as needed per policy Assess personal safety and home safety (as indicated) on admission and as needed Assess self care needs on admission and as needed Notes: Necrotic Tissue Nursing Diagnoses: Knowledge deficit related to management of necrotic/devitalized tissue Goals: Necrotic/devitalized tissue will be minimized in the wound bed Date Initiated: 08/28/2022 Target Resolution Date: 10/17/2022 Goal Status: Active Interventions: Assess patient pain level pre-, during and post procedure and prior to discharge Notes: WAYMOND, MEADOR ([id number]) [id number] Page 6 of 9 Nutrition Nursing Diagnoses: Impaired glucose control: actual or potential Goals: Patient/caregiver will maintain therapeutic glucose control Date Initiated: 08/28/2022 Target Resolution Date: 10/17/2022 Goal Status: Active Interventions: Assess HgA1c results as ordered upon admission and as needed Assess patient nutrition upon admission and as needed per policy Notes: Wound/Skin Impairment Nursing Diagnoses: Knowledge deficit related to ulceration/compromised skin integrity Goals: Patient/caregiver will verbalize understanding of skin care regimen Date Initiated: 08/28/2022 Target Resolution Date: 10/17/2022 Goal Status: Active Ulcer/skin breakdown will have a volume reduction of 30% by week 4 Date Initiated: 08/28/2022 Target Resolution Date: 10/17/2022 Goal Status: Active Ulcer/skin breakdown will have a volume  reduction of 50% by week 8 Date Initiated: 08/28/2022 Target Resolution Date: 11/17/2022 Goal Status: Active Ulcer/skin breakdown will have a volume reduction of 80% by week 12 Date Initiated: 08/28/2022 Target Resolution Date: 12/18/2022 Goal Status: Active Ulcer/skin breakdown will heal within 14 weeks Date Initiated: 08/28/2022 Target Resolution Date: 01/16/2023 Goal Status: Active Interventions: Assess patient/caregiver ability to obtain necessary supplies Assess patient/caregiver ability to perform ulcer/skin care regimen upon admission and as needed Assess ulceration(s) every visit Notes: Electronic Signature(s) Signed: 09/05/2022 1:14:49 PM By: [name] RN Entered By: [name] on 09/04/2022 10:28:56 -------------------------------------------------------------------------------- Pain Assessment Details Patient Name: Date of Service: [name], [name] MES L. 09/04/2022 10:15 A M Medical Record Number: [id number] Patient Account Number: [id number] Date of Birth/Sex: Treating RN: 06-20-1966 (56 y.o. [name] Primary Care Monette Omara: [name] Other Clinician: Referring Anarely Nicholls: Treating Merdis Snodgrass/Extender: [name] in  Treatment: 1 Active Problems Location of Pain Severity and Description of Pain Patient Has Paino No DEIVI, HUCKINS ([id number]) [id number] Page 7 of 9 Patient Has Paino No Site Locations Pain Management and Medication Current Pain Management: Electronic Signature(s) Signed: 09/05/2022 1:14:49 PM By: [name] RN Entered By: [name] on 09/04/2022 10:22:51 -------------------------------------------------------------------------------- Patient/Caregiver Education Details Patient Name: Date of Service: [name], [name] MES L. 11/16/2023andnbsp10:15 A M Medical Record Number: [id number] Patient Account Number: [id number] Date of Birth/Gender: Treating RN: 04-Nov-1965 (56 y.o. [name] Primary Care  Physician: [name] Other Clinician: Referring Physician: Treating Physician/Extender: [name] Education Assessment Education Provided To: Patient Education Topics Provided Wound/Skin Impairment: Methods: Explain/Verbal Responses: State content correctly Electronic Signature(s) Signed: 09/05/2022 1:14:49 PM By: [name] RN Entered By: [name] on 09/04/2022 10:43:41 Back, Joyice Faster ([id number]) [id number] Page 8 of 9 -------------------------------------------------------------------------------- Wound Assessment Details Patient Name: Date of Service: [name] MES L. 09/04/2022 10:15 A M Medical Record Number: [id number] Patient Account Number: [id number] Date of Birth/Sex: Treating RN: [birth year] (56 y.o. [name]) [name] Primary Care Lleyton Byers: [name] Other Clinician: Referring Hazem Kenner: Treating Aramis Zobel/Extender: [name] Wound Status Wound Number: 3 Primary Dehisced Wound Etiology: Wound Location: Left Amputation Site - Below Knee Wound Open Wounding Event: Surgical Injury Status: Date Acquired: 07/19/2022 Comorbid Cataracts, Anemia, Sleep Apnea, Arrhythmia, Congestive Heart Weeks Of Treatment: 1 History: Failure, Coronary Artery Disease, Hypertension, Myocardial Clustered Wound: No Infarction, Type II Diabetes, Neuropathy, Seizure Disorder Photos Wound Measurements Length: (cm) 11.5 Width: (cm) 1.5 Depth: (cm) 4 Area: (cm) 13.548 Volume: (cm) 54.192 % Reduction in Area: 0% % Reduction in Volume: -1233.5% Epithelialization: None Tunneling: No Undermining: No Wound Description Classification: Full Thickness Without Exposed Suppor Exudate Amount: Medium Exudate Type: Serosanguineous Exudate Color: red, brown t Structures Foul Odor After Cleansing: No Slough/Fibrino Yes Wound Bed Granulation Amount: Medium (34-66%) Exposed  Structure Granulation Quality: Pink Fascia Exposed: No Necrotic Amount: Medium (34-66%) Fat Layer (Subcutaneous Tissue) Exposed: Yes Necrotic Quality: Adherent Slough Tendon Exposed: No Muscle Exposed: No Joint Exposed: No Bone Exposed: No Treatment Notes Wound #3 (Amputation Site - Below Knee) Wound Laterality: Left Cleanser Peri-Wound Care Topical Chavana, Santo L ([id number]) [id number] Page 9 of 9 Santyl Collagenase Ointment, 30 (gm), tube Discharge Instruction: apply nickel thick to wound bed only Primary Dressing Gauze Discharge Instruction: moistened with normal saline Secondary Dressing ABD Pad 5x9 (in/in) Discharge Instruction: Cover with ABD pad Secured With [place] H Soft Cloth Surgical T ape ape, 2x2 (in/yd) Tubigrip Size D, 3x10 (in/yd) Compression Wrap Compression Stockings Add-Ons Electronic Signature(s) Signed: 09/05/2022 1:14:49 PM By: [name] RN Entered By: [name] on 09/04/2022 10:28:40 -------------------------------------------------------------------------------- Vitals Details Patient Name: Date of Service: [name], [name] MES L. 09/04/2022 10:15 A M Medical Record Number: [id number] Patient Account Number: [id number] Date of Birth/Sex: Treating RN: 1966/03/11 (56 y.o. [name]) [name] Primary Care Leelyn Jasinski: [name] Other Clinician: Referring Kathlene Yano: Treating Caylen Yardley/Extender: [name] Vital Signs Time Taken: 10:22 Temperature (F): 97.5 Height (in): 70 Pulse (bpm): 67 Weight (lbs): 166 Respiratory Rate (breaths/min): 18 Body Mass Index (BMI): 23.8 Blood Pressure (mmHg): 152/92 Reference Range: 80 - 120 mg / dl Electronic Signature(s) Signed: 09/05/2022 1:14:49 PM By: [name] RN Entered By: [name] on 09/04/2022 10:22:33

## 2022-09-07 DIAGNOSIS — L02612 Cutaneous abscess of left foot: Secondary | ICD-10-CM | POA: Diagnosis not present

## 2022-09-07 DIAGNOSIS — Z992 Dependence on renal dialysis: Secondary | ICD-10-CM | POA: Diagnosis not present

## 2022-09-07 DIAGNOSIS — N186 End stage renal disease: Secondary | ICD-10-CM | POA: Diagnosis not present

## 2022-09-07 DIAGNOSIS — D631 Anemia in chronic kidney disease: Secondary | ICD-10-CM | POA: Diagnosis not present

## 2022-09-07 DIAGNOSIS — I96 Gangrene, not elsewhere classified: Secondary | ICD-10-CM | POA: Diagnosis not present

## 2022-09-07 DIAGNOSIS — N2581 Secondary hyperparathyroidism of renal origin: Secondary | ICD-10-CM | POA: Diagnosis not present

## 2022-09-07 DIAGNOSIS — Z5181 Encounter for therapeutic drug level monitoring: Secondary | ICD-10-CM | POA: Diagnosis not present

## 2022-09-08 DIAGNOSIS — I959 Hypotension, unspecified: Secondary | ICD-10-CM | POA: Diagnosis not present

## 2022-09-08 DIAGNOSIS — I132 Hypertensive heart and chronic kidney disease with heart failure and with stage 5 chronic kidney disease, or end stage renal disease: Secondary | ICD-10-CM | POA: Diagnosis not present

## 2022-09-08 DIAGNOSIS — E1122 Type 2 diabetes mellitus with diabetic chronic kidney disease: Secondary | ICD-10-CM | POA: Diagnosis not present

## 2022-09-08 DIAGNOSIS — T8781 Dehiscence of amputation stump: Secondary | ICD-10-CM | POA: Diagnosis not present

## 2022-09-08 DIAGNOSIS — A400 Sepsis due to streptococcus, group A: Secondary | ICD-10-CM | POA: Diagnosis not present

## 2022-09-08 DIAGNOSIS — I5020 Unspecified systolic (congestive) heart failure: Secondary | ICD-10-CM | POA: Diagnosis not present

## 2022-09-09 ENCOUNTER — Ambulatory Visit: Payer: Medicare Other | Admitting: Physician Assistant

## 2022-09-09 DIAGNOSIS — D631 Anemia in chronic kidney disease: Secondary | ICD-10-CM | POA: Diagnosis not present

## 2022-09-09 DIAGNOSIS — N2581 Secondary hyperparathyroidism of renal origin: Secondary | ICD-10-CM | POA: Diagnosis not present

## 2022-09-09 DIAGNOSIS — G40909 Epilepsy, unspecified, not intractable, without status epilepticus: Secondary | ICD-10-CM | POA: Diagnosis not present

## 2022-09-09 DIAGNOSIS — Z992 Dependence on renal dialysis: Secondary | ICD-10-CM | POA: Diagnosis not present

## 2022-09-09 DIAGNOSIS — L97828 Non-pressure chronic ulcer of other part of left lower leg with other specified severity: Secondary | ICD-10-CM | POA: Diagnosis not present

## 2022-09-09 DIAGNOSIS — I110 Hypertensive heart disease with heart failure: Secondary | ICD-10-CM | POA: Diagnosis not present

## 2022-09-09 DIAGNOSIS — I509 Heart failure, unspecified: Secondary | ICD-10-CM | POA: Diagnosis not present

## 2022-09-09 DIAGNOSIS — E1151 Type 2 diabetes mellitus with diabetic peripheral angiopathy without gangrene: Secondary | ICD-10-CM | POA: Diagnosis not present

## 2022-09-09 DIAGNOSIS — E1140 Type 2 diabetes mellitus with diabetic neuropathy, unspecified: Secondary | ICD-10-CM | POA: Diagnosis not present

## 2022-09-09 DIAGNOSIS — N186 End stage renal disease: Secondary | ICD-10-CM | POA: Diagnosis not present

## 2022-09-09 DIAGNOSIS — I96 Gangrene, not elsewhere classified: Secondary | ICD-10-CM | POA: Diagnosis not present

## 2022-09-10 DIAGNOSIS — I5020 Unspecified systolic (congestive) heart failure: Secondary | ICD-10-CM | POA: Diagnosis not present

## 2022-09-10 DIAGNOSIS — I959 Hypotension, unspecified: Secondary | ICD-10-CM | POA: Diagnosis not present

## 2022-09-10 DIAGNOSIS — E1122 Type 2 diabetes mellitus with diabetic chronic kidney disease: Secondary | ICD-10-CM | POA: Diagnosis not present

## 2022-09-10 DIAGNOSIS — A400 Sepsis due to streptococcus, group A: Secondary | ICD-10-CM | POA: Diagnosis not present

## 2022-09-10 DIAGNOSIS — T8781 Dehiscence of amputation stump: Secondary | ICD-10-CM | POA: Diagnosis not present

## 2022-09-10 DIAGNOSIS — I132 Hypertensive heart and chronic kidney disease with heart failure and with stage 5 chronic kidney disease, or end stage renal disease: Secondary | ICD-10-CM | POA: Diagnosis not present

## 2022-09-11 DIAGNOSIS — Z992 Dependence on renal dialysis: Secondary | ICD-10-CM | POA: Diagnosis not present

## 2022-09-11 DIAGNOSIS — I959 Hypotension, unspecified: Secondary | ICD-10-CM | POA: Diagnosis not present

## 2022-09-11 DIAGNOSIS — Z955 Presence of coronary angioplasty implant and graft: Secondary | ICD-10-CM | POA: Diagnosis not present

## 2022-09-11 DIAGNOSIS — Z89512 Acquired absence of left leg below knee: Secondary | ICD-10-CM | POA: Diagnosis not present

## 2022-09-11 DIAGNOSIS — I70201 Unspecified atherosclerosis of native arteries of extremities, right leg: Secondary | ICD-10-CM | POA: Diagnosis not present

## 2022-09-11 DIAGNOSIS — E1140 Type 2 diabetes mellitus with diabetic neuropathy, unspecified: Secondary | ICD-10-CM | POA: Diagnosis not present

## 2022-09-11 DIAGNOSIS — N186 End stage renal disease: Secondary | ICD-10-CM | POA: Diagnosis not present

## 2022-09-11 DIAGNOSIS — Z556 Problems related to health literacy: Secondary | ICD-10-CM | POA: Diagnosis not present

## 2022-09-11 DIAGNOSIS — I272 Pulmonary hypertension, unspecified: Secondary | ICD-10-CM | POA: Diagnosis not present

## 2022-09-11 DIAGNOSIS — E440 Moderate protein-calorie malnutrition: Secondary | ICD-10-CM | POA: Diagnosis not present

## 2022-09-11 DIAGNOSIS — E1151 Type 2 diabetes mellitus with diabetic peripheral angiopathy without gangrene: Secondary | ICD-10-CM | POA: Diagnosis not present

## 2022-09-11 DIAGNOSIS — I25119 Atherosclerotic heart disease of native coronary artery with unspecified angina pectoris: Secondary | ICD-10-CM | POA: Diagnosis not present

## 2022-09-11 DIAGNOSIS — Z89431 Acquired absence of right foot: Secondary | ICD-10-CM | POA: Diagnosis not present

## 2022-09-11 DIAGNOSIS — Z9181 History of falling: Secondary | ICD-10-CM | POA: Diagnosis not present

## 2022-09-11 DIAGNOSIS — I350 Nonrheumatic aortic (valve) stenosis: Secondary | ICD-10-CM | POA: Diagnosis not present

## 2022-09-11 DIAGNOSIS — E782 Mixed hyperlipidemia: Secondary | ICD-10-CM | POA: Diagnosis not present

## 2022-09-11 DIAGNOSIS — I132 Hypertensive heart and chronic kidney disease with heart failure and with stage 5 chronic kidney disease, or end stage renal disease: Secondary | ICD-10-CM | POA: Diagnosis not present

## 2022-09-11 DIAGNOSIS — D631 Anemia in chronic kidney disease: Secondary | ICD-10-CM | POA: Diagnosis not present

## 2022-09-11 DIAGNOSIS — E039 Hypothyroidism, unspecified: Secondary | ICD-10-CM | POA: Diagnosis not present

## 2022-09-11 DIAGNOSIS — R569 Unspecified convulsions: Secondary | ICD-10-CM | POA: Diagnosis not present

## 2022-09-11 DIAGNOSIS — E1122 Type 2 diabetes mellitus with diabetic chronic kidney disease: Secondary | ICD-10-CM | POA: Diagnosis not present

## 2022-09-11 DIAGNOSIS — Z79899 Other long term (current) drug therapy: Secondary | ICD-10-CM | POA: Diagnosis not present

## 2022-09-11 DIAGNOSIS — I5020 Unspecified systolic (congestive) heart failure: Secondary | ICD-10-CM | POA: Diagnosis not present

## 2022-09-11 DIAGNOSIS — I252 Old myocardial infarction: Secondary | ICD-10-CM | POA: Diagnosis not present

## 2022-09-11 DIAGNOSIS — T8781 Dehiscence of amputation stump: Secondary | ICD-10-CM | POA: Diagnosis not present

## 2022-09-12 DIAGNOSIS — N2581 Secondary hyperparathyroidism of renal origin: Secondary | ICD-10-CM | POA: Diagnosis not present

## 2022-09-12 DIAGNOSIS — I96 Gangrene, not elsewhere classified: Secondary | ICD-10-CM | POA: Diagnosis not present

## 2022-09-12 DIAGNOSIS — T8781 Dehiscence of amputation stump: Secondary | ICD-10-CM | POA: Diagnosis not present

## 2022-09-12 DIAGNOSIS — Z992 Dependence on renal dialysis: Secondary | ICD-10-CM | POA: Diagnosis not present

## 2022-09-12 DIAGNOSIS — D631 Anemia in chronic kidney disease: Secondary | ICD-10-CM | POA: Diagnosis not present

## 2022-09-12 DIAGNOSIS — N186 End stage renal disease: Secondary | ICD-10-CM | POA: Diagnosis not present

## 2022-09-12 DIAGNOSIS — I132 Hypertensive heart and chronic kidney disease with heart failure and with stage 5 chronic kidney disease, or end stage renal disease: Secondary | ICD-10-CM | POA: Diagnosis not present

## 2022-09-12 DIAGNOSIS — E1122 Type 2 diabetes mellitus with diabetic chronic kidney disease: Secondary | ICD-10-CM | POA: Diagnosis not present

## 2022-09-12 DIAGNOSIS — I5020 Unspecified systolic (congestive) heart failure: Secondary | ICD-10-CM | POA: Diagnosis not present

## 2022-09-12 DIAGNOSIS — I959 Hypotension, unspecified: Secondary | ICD-10-CM | POA: Diagnosis not present

## 2022-09-15 DIAGNOSIS — E1122 Type 2 diabetes mellitus with diabetic chronic kidney disease: Secondary | ICD-10-CM | POA: Diagnosis not present

## 2022-09-15 DIAGNOSIS — I96 Gangrene, not elsewhere classified: Secondary | ICD-10-CM | POA: Diagnosis not present

## 2022-09-15 DIAGNOSIS — I132 Hypertensive heart and chronic kidney disease with heart failure and with stage 5 chronic kidney disease, or end stage renal disease: Secondary | ICD-10-CM | POA: Diagnosis not present

## 2022-09-15 DIAGNOSIS — T8781 Dehiscence of amputation stump: Secondary | ICD-10-CM | POA: Diagnosis not present

## 2022-09-15 DIAGNOSIS — I5020 Unspecified systolic (congestive) heart failure: Secondary | ICD-10-CM | POA: Diagnosis not present

## 2022-09-15 DIAGNOSIS — Z992 Dependence on renal dialysis: Secondary | ICD-10-CM | POA: Diagnosis not present

## 2022-09-15 DIAGNOSIS — N2581 Secondary hyperparathyroidism of renal origin: Secondary | ICD-10-CM | POA: Diagnosis not present

## 2022-09-15 DIAGNOSIS — D631 Anemia in chronic kidney disease: Secondary | ICD-10-CM | POA: Diagnosis not present

## 2022-09-15 DIAGNOSIS — I959 Hypotension, unspecified: Secondary | ICD-10-CM | POA: Diagnosis not present

## 2022-09-15 DIAGNOSIS — N186 End stage renal disease: Secondary | ICD-10-CM | POA: Diagnosis not present

## 2022-09-16 ENCOUNTER — Encounter: Payer: Medicare Other | Admitting: Physician Assistant

## 2022-09-16 DIAGNOSIS — E1151 Type 2 diabetes mellitus with diabetic peripheral angiopathy without gangrene: Secondary | ICD-10-CM | POA: Diagnosis not present

## 2022-09-16 DIAGNOSIS — L97828 Non-pressure chronic ulcer of other part of left lower leg with other specified severity: Secondary | ICD-10-CM | POA: Diagnosis not present

## 2022-09-16 DIAGNOSIS — E1140 Type 2 diabetes mellitus with diabetic neuropathy, unspecified: Secondary | ICD-10-CM | POA: Diagnosis not present

## 2022-09-16 DIAGNOSIS — G40909 Epilepsy, unspecified, not intractable, without status epilepticus: Secondary | ICD-10-CM | POA: Diagnosis not present

## 2022-09-16 DIAGNOSIS — T8131XA Disruption of external operation (surgical) wound, not elsewhere classified, initial encounter: Secondary | ICD-10-CM | POA: Diagnosis not present

## 2022-09-16 DIAGNOSIS — I509 Heart failure, unspecified: Secondary | ICD-10-CM | POA: Diagnosis not present

## 2022-09-16 DIAGNOSIS — I110 Hypertensive heart disease with heart failure: Secondary | ICD-10-CM | POA: Diagnosis not present

## 2022-09-16 NOTE — Progress Notes (Signed)
[address] [place] ([id number]) [id number] Page 1 of 8 Visit Report for 09/16/2022 Arrival Information Details Patient Name: Date of Service: [name] MES L. 09/16/2022 7:45 A M Medical Record Number: [id number] Patient Account Number: [id number] Date of Birth/Sex: Treating RN: July 24, 1966 (56 y.o. Seward Meth Primary Care Chesni Vos: [name] Other Clinician: Referring Hannia Matchett: Treating Jenniah Bhavsar/Extender: [name] in Treatment: 2 Visit Information History Since Last Visit Added or deleted any medications: No Patient Arrived: Wheel Chair Any new allergies or adverse reactions: No Arrival Time: 07:59 Signs or symptoms of abuse/neglect since last visito No Accompanied By: wife Hospitalized since last visit: No Transfer Assistance: None Pain Present Now: No Patient Identification Verified: Yes Secondary Verification Process Completed: Yes Patient Requires Transmission-Based Precautions: No Patient Has Alerts: No Electronic Signature(s) Signed: 09/16/2022 8:58:49 AM By: Rosalio Loud MSN RN CNS WTA Entered By: Rosalio Loud on 09/16/2022 08:58:49 -------------------------------------------------------------------------------- Clinic Level of Care Assessment Details Patient Name: Date of Service: [name], [name] MES L. 09/16/2022 7:45 A M Medical Record Number: [id number] Patient Account Number: [id number] Date of Birth/Sex: Treating RN: [birth year] (56 y.o. Seward Meth Primary Care Keshayla Schrum: [name] Other Clinician: Referring Scottie Stanish: Treating Karlisha Mathena/Extender: [name] in Treatment: 2 Clinic Level of Care Assessment Items TOOL 1 Quantity Score []  - 0 Use when EandM and Procedure is performed on INITIAL visit ASSESSMENTS - Nursing Assessment / Reassessment []  - 0 General Physical Exam (combine w/ comprehensive assessment (listed just below) when performed on new pt. evals) []  - 0 Comprehensive  Assessment (HX, ROS, Risk Assessments, Wounds Hx, etc.) ASSESSMENTS - Wound and Skin Assessment / Reassessment []  - 0 Dermatologic / Skin Assessment (not related to wound area) ASSESSMENTS - Ostomy and/or Continence Assessment and Care LONNELL, CHAPUT ([id number]) [id number] Page 2 of 8 []  - 0 Incontinence Assessment and Management []  - 0 Ostomy Care Assessment and Management (repouching, etc.) PROCESS - Coordination of Care []  - 0 Simple Patient / Family Education for ongoing care []  - 0 Complex (extensive) Patient / Family Education for ongoing care []  - 0 Staff obtains Programmer, systems, Records, T Results / Process Orders est []  - 0 Staff telephones HHA, Nursing Homes / Clarify orders / etc []  - 0 Routine Transfer to another Facility (non-emergent condition) []  - 0 Routine Hospital Admission (non-emergent condition) []  - 0 New Admissions / Biomedical engineer / Ordering NPWT Apligraf, etc. , []  - 0 Emergency Hospital Admission (emergent condition) PROCESS - Special Needs []  - 0 Pediatric / Minor Patient Management []  - 0 Isolation Patient Management []  - 0 Hearing / Language / Visual special needs []  - 0 Assessment of Community assistance (transportation, D/C planning, etc.) []  - 0 Additional assistance / Altered mentation []  - 0 Support Surface(s) Assessment (bed, cushion, seat, etc.) INTERVENTIONS - Miscellaneous []  - 0 External ear exam []  - 0 Patient Transfer (multiple staff / Civil Service fast streamer / Similar devices) []  - 0 Simple Staple / Suture removal (25 or less) []  - 0 Complex Staple / Suture removal (26 or more) []  - 0 Hypo/Hyperglycemic Management (do not check if billed separately) []  - 0 Ankle / Brachial Index (ABI) - do not check if billed separately Has the patient been seen at the hospital within the last three years: Yes Total Score: 0 Level Of Care: ____ Electronic Signature(s) Signed: 09/16/2022 3:52:05 PM By: Rosalio Loud  MSN RN CNS WTA Entered By: Rosalio Loud on 09/16/2022 09:01:06 -------------------------------------------------------------------------------- Encounter Discharge Information Details Patient Name:  Date of Service: [name] MES L. 09/16/2022 7:45 A M Medical Record Number: [id number] Patient Account Number: [id number] Date of Birth/Sex: Treating RN: 07/09/1966 (56 y.o. Seward Meth Primary Care Nakoma Gotwalt: [name] Other Clinician: Referring Tamina Cyphers: Treating Mishaal Lansdale/Extender: [name] in Treatment: 2 Encounter Discharge Information Items Post Procedure Vitals Discharge Condition: Stable Temperature (F): 98.3 Pollinger, Jaquil L ([id number]) [id number] Page 3 of 8 Ambulatory Status: Wheelchair Pulse (bpm): 72 Discharge Destination: Home Respiratory Rate (breaths/min): 16 Transportation: Private Auto Blood Pressure (mmHg): 164/99 Accompanied By: wife Schedule Follow-up Appointment: Yes Clinical Summary of Care: Electronic Signature(s) Signed: 09/16/2022 9:02:38 AM By: Rosalio Loud MSN RN CNS WTA Entered By: Rosalio Loud on 09/16/2022 09:02:37 -------------------------------------------------------------------------------- Lower Extremity Assessment Details Patient Name: Date of Service: [name], [name] MES L. 09/16/2022 7:45 A M Medical Record Number: [id number] Patient Account Number: [id number] Date of Birth/Sex: Treating RN: 08-Nov-1965 (56 y.o. Seward Meth Primary Care Cung Masterson: [name] Other Clinician: Referring Deshan Hemmelgarn: Treating Alamin Mccuiston/Extender: [name] in Treatment: 2 Electronic Signature(s) Signed: 09/16/2022 3:52:05 PM By: Rosalio Loud MSN RN CNS WTA Entered By: Rosalio Loud on 09/16/2022 08:13:43 -------------------------------------------------------------------------------- Multi Wound Chart Details Patient Name: Date of Service: [name], [name] MES L. 09/16/2022 7:45 A M Medical  Record Number: [id number] Patient Account Number: [id number] Date of Birth/Sex: Treating RN: Feb 14, 1966 (56 y.o. Seward Meth Primary Care Lanyiah Brix: [name] Other Clinician: Referring Shariya Gaster: Treating Jurrell Royster/Extender: [name] in Treatment: 2 Vital Signs Height(in): 70 Pulse(bpm): 28 Weight(lbs): 166 Blood Pressure(mmHg): 164/99 Body Mass Index(BMI): 23.8 Temperature(F): 98.3 Respiratory Rate(breaths/min): 18 [3:Photos:] [N/A:N/A] Left Amputation Site - Below Knee N/A N/A Wound Location: Surgical Injury N/A N/A Wounding Event: Dehisced Wound N/A N/A Primary Etiology: Cataracts, Anemia, Sleep Apnea, N/A N/A Comorbid History: Arrhythmia, Congestive Heart Failure, Coronary Artery Disease, Hypertension, Myocardial Infarction, Type II Diabetes, Neuropathy, Seizure Disorder 07/19/2022 N/A N/A Date Acquired: 2 N/A N/A Weeks of Treatment: Open N/A N/A Wound Status: No N/A N/A Wound Recurrence: 11.2x3.5x3 N/A N/A Measurements L x W x D (cm) 30.788 N/A N/A A (cm) : rea 92.363 N/A N/A Volume (cm) : -127.30% N/A N/A % Reduction in Area: -2172.70% N/A N/A % Reduction in Volume: Full Thickness Without Exposed N/A N/A Classification: Support Structures Medium N/A N/A Exudate Amount: Serosanguineous N/A N/A Exudate Type: red, brown N/A N/A Exudate Color: Medium (34-66%) N/A N/A Granulation Amount: Pink N/A N/A Granulation Quality: Medium (34-66%) N/A N/A Necrotic Amount: Fat Layer (Subcutaneous Tissue): Yes N/A N/A Exposed Structures: Fascia: No Tendon: No Muscle: No Joint: No Bone: No None N/A N/A Epithelialization: Treatment Notes Electronic Signature(s) Signed: 09/16/2022 8:59:09 AM By: Rosalio Loud MSN RN CNS WTA Entered By: Rosalio Loud on 09/16/2022 08:59:09 -------------------------------------------------------------------------------- Walker Details Patient Name: Date of Service: [name], [name] MES L. 09/16/2022 7:45 A M Medical Record Number: [id number] Patient Account Number: [id number] Date of Birth/Sex: Treating RN: 12/22/1965 (56 y.o. Seward Meth Primary Care Terriona Horlacher: [name] Other Clinician: Referring Keondrick Dilks: Treating Noha Milberger/Extender: [name] in Treatment: 2 Active Inactive Abuse / Safety / Falls / Self Care Management Nursing Diagnoses: Potential for injury related to falls Goals: BENZION, MESTA ([id number]) [id number] Page 5 of 8 Patient will remain injury free related to falls Date Initiated: 08/28/2022 Target Resolution Date: 09/27/2022 Goal Status: Active Interventions: Assess Activities of Daily Living upon admission and as needed Assess fall risk on admission and as needed Assess: immobility, friction, shearing, incontinence  upon admission and as needed Assess impairment of mobility on admission and as needed per policy Assess personal safety and home safety (as indicated) on admission and as needed Assess self care needs on admission and as needed Notes: Necrotic Tissue Nursing Diagnoses: Knowledge deficit related to management of necrotic/devitalized tissue Goals: Necrotic/devitalized tissue will be minimized in the wound bed Date Initiated: 08/28/2022 Target Resolution Date: 10/17/2022 Goal Status: Active Interventions: Assess patient pain level pre-, during and post procedure and prior to discharge Notes: Nutrition Nursing Diagnoses: Impaired glucose control: actual or potential Goals: Patient/caregiver will maintain therapeutic glucose control Date Initiated: 08/28/2022 Target Resolution Date: 10/17/2022 Goal Status: Active Interventions: Assess HgA1c results as ordered upon admission and as needed Assess patient nutrition upon admission and as needed per policy Notes: Wound/Skin Impairment Nursing Diagnoses: Knowledge deficit related to ulceration/compromised skin  integrity Goals: Patient/caregiver will verbalize understanding of skin care regimen Date Initiated: 08/28/2022 Target Resolution Date: 10/17/2022 Goal Status: Active Ulcer/skin breakdown will have a volume reduction of 30% by week 4 Date Initiated: 08/28/2022 Target Resolution Date: 10/17/2022 Goal Status: Active Ulcer/skin breakdown will have a volume reduction of 50% by week 8 Date Initiated: 08/28/2022 Target Resolution Date: 11/17/2022 Goal Status: Active Ulcer/skin breakdown will have a volume reduction of 80% by week 12 Date Initiated: 08/28/2022 Target Resolution Date: 12/18/2022 Goal Status: Active Ulcer/skin breakdown will heal within 14 weeks Date Initiated: 08/28/2022 Target Resolution Date: 01/16/2023 Goal Status: Active Interventions: Assess patient/caregiver ability to obtain necessary supplies Assess patient/caregiver ability to perform ulcer/skin care regimen upon admission and as needed Assess ulceration(s) every visit Notes: Electronic Signature(s) Signed: 09/16/2022 8:59:00 AM By: Rosalio Loud MSN RN CNS [address] [place] ([id number]) [id number] Page 6 of 8 Entered By: Rosalio Loud on 09/16/2022 08:59:00 -------------------------------------------------------------------------------- Pain Assessment Details Patient Name: Date of Service: [name] MES L. 09/16/2022 7:45 A M Medical Record Number: [id number] Patient Account Number: [id number] Date of Birth/Sex: Treating RN: 07-Jun-1966 (56 y.o. Seward Meth Primary Care Emaree Chiu: [name] Other Clinician: Referring Aine Strycharz: Treating Leva Baine/Extender: [name] in Treatment: 2 Active Problems Location of Pain Severity and Description of Pain Patient Has Paino Yes Site Locations With Dressing Change: Yes Rate the pain. Current Pain Level: 4 Worst Pain Level: 4 Least Pain Level: 2 Tolerable Pain Level: 3 Character of Pain Describe the Pain: Aching,  Tender, Throbbing Pain Management and Medication Current Pain Management: Electronic Signature(s) Signed: 09/16/2022 3:52:05 PM By: Rosalio Loud MSN RN CNS WTA Entered By: Rosalio Loud on 09/16/2022 08:05:21 -------------------------------------------------------------------------------- Patient/Caregiver Education Details Patient Name: Date of Service: [name], JA MES L. 11/28/2023andnbsp7:45 A M [place], [place] ([id number]) [id number] Page 7 of 8 Medical Record Number: [id number] Patient Account Number: [id number] Date of Birth/Gender: Treating RN: December 06, 1965 (56 y.o. Seward Meth Primary Care Physician: [name] Other Clinician: Referring Physician: Treating Physician/Extender: [name] in Treatment: 2 Education Assessment Education Provided To: Patient Education Topics Provided Wound/Skin Impairment: Handouts: Caring for Your Ulcer Methods: Explain/Verbal Responses: State content correctly Electronic Signature(s) Signed: 09/16/2022 3:52:05 PM By: Rosalio Loud MSN RN CNS WTA Entered By: Rosalio Loud on 09/16/2022 09:01:35 -------------------------------------------------------------------------------- Wound Assessment Details Patient Name: Date of Service: [name], [name] MES L. 09/16/2022 7:45 A M Medical Record Number: [id number] Patient Account Number: [id number] Date of Birth/Sex: Treating RN: 01-Sep-1966 (56 y.o. Seward Meth Primary Care Ivana Nicastro: [name] Other Clinician: Referring Mariadelaluz Guggenheim: Treating Kymari Nuon/Extender: [name] in Treatment: 2 Wound Status Wound  Number: 3 Primary Dehisced Wound Etiology: Wound Location: Left Amputation Site - Below Knee Wound Open Wounding Event: Surgical Injury Status: Date Acquired: 07/19/2022 Comorbid Cataracts, Anemia, Sleep Apnea, Arrhythmia, Congestive Heart Weeks Of Treatment: 2 History: Failure, Coronary Artery Disease, Hypertension,  Myocardial Clustered Wound: No Infarction, Type II Diabetes, Neuropathy, Seizure Disorder Photos Wound Measurements Length: (cm) 11.2 Width: (cm) 3.5 Depth: (cm) 3 Area: (cm) 30.788 Monnin, Kelvon L ([id number]) Volume: (cm) 92.363 % Reduction in Area: -127.3% % Reduction in Volume: -2172.7% Epithelialization: None [id number] Page 8 of 8 Wound Description Classification: Full Thickness Without Exposed Suppor Exudate Amount: Medium Exudate Type: Serosanguineous Exudate Color: red, brown t Structures Foul Odor After Cleansing: No Slough/Fibrino Yes Wound Bed Granulation Amount: Medium (34-66%) Exposed Structure Granulation Quality: Pink Fascia Exposed: No Necrotic Amount: Medium (34-66%) Fat Layer (Subcutaneous Tissue) Exposed: Yes Necrotic Quality: Adherent Slough Tendon Exposed: No Muscle Exposed: No Joint Exposed: No Bone Exposed: No Electronic Signature(s) Signed: 09/16/2022 3:52:05 PM By: Rosalio Loud MSN RN CNS WTA Entered By: Rosalio Loud on 09/16/2022 08:12:56 -------------------------------------------------------------------------------- Vitals Details Patient Name: Date of Service: [name], JA MES L. 09/16/2022 7:45 A M Medical Record Number: [id number] Patient Account Number: [id number] Date of Birth/Sex: Treating RN: 1965/12/06 (56 y.o. Seward Meth Primary Care Manuel Lawhead: [name] Other Clinician: Referring Madelein Mahadeo: Treating Soham Hollett/Extender: [name] in Treatment: 2 Vital Signs Time Taken: 08:02 Temperature (F): 98.3 Height (in): 70 Pulse (bpm): 72 Weight (lbs): 166 Respiratory Rate (breaths/min): 18 Body Mass Index (BMI): 23.8 Blood Pressure (mmHg): 164/99 Reference Range: 80 - 120 mg / dl Electronic Signature(s) Signed: 09/16/2022 3:52:05 PM By: Rosalio Loud MSN RN CNS WTA Entered By: Rosalio Loud on 09/16/2022 08:04:46

## 2022-09-16 NOTE — Progress Notes (Signed)
**Note De-Identified [name] Obfuscation** [address] Kopperl [name] ([id number]) [id number] Page 1 of 5 Visit Report for 09/09/2022 Arrival Information Details Patient Name: Date of Service: [name] [name]. 09/09/2022 3:30 PM Medical Record Number: [id number] Patient Account Number: [id number] Date of Birth/Sex: Treating RN: [birth year] (56 y.o. [name]) [name] Primary Care [name]: [name] Other Clinician: Referring [name]: Treating [name]/Extender: [name] in Treatment: 1 Visit Information History Since Last Visit All ordered tests and consults were completed: No Patient Arrived: Wheel Chair Added or deleted any medications: No Arrival Time: 15:40 Any new allergies or adverse reactions: No Accompanied By: wife Had a fall or experienced change in No Transfer Assistance: None activities of daily living that may affect Patient Identification Verified: Yes risk of falls: Secondary Verification Process Completed: Yes Signs or symptoms of abuse/neglect since last visito No Patient Requires Transmission-Based Precautions: No Hospitalized since last visit: No Patient Has Alerts: No Implantable device outside of the clinic excluding No cellular tissue based products placed in the center since last visit: Has Dressing in Place as Prescribed: Yes Has Compression in Place as Prescribed: Yes Pain Present Now: No Electronic Signature(s) Signed: 09/16/2022 4:02:35 PM By: [name] Coria RN Entered By: [name] on 09/16/2022 16:02:35 -------------------------------------------------------------------------------- Clinic Level of Care Assessment Details Patient Name: Date of Service: [name] [name]. 09/09/2022 3:30 PM Medical Record Number: [id number] Patient Account Number: [id number] Date of Birth/Sex: Treating RN: Sep 25, 1966 (56 y.o. [name] Primary Care [name]: [name] Other Clinician: Referring [name]: Treating [name]/Extender: [name] in Treatment: 1 Clinic Level of Care Assessment Items TOOL 4 Quantity Score X- 1 0 Use when only an EandM is performed on FOLLOW-UP visit ASSESSMENTS - Nursing Assessment / Reassessment []  - 0 Reassessment of Co-morbidities (includes updates in patient status) [name] ([id number]) [id number] Page 2 of 5 []  - 0 Reassessment of Adherence to Treatment Plan ASSESSMENTS - Wound and Skin A ssessment / Reassessment X - Simple Wound Assessment / Reassessment - one wound 1 5 []  - 0 Complex Wound Assessment / Reassessment - multiple wounds []  - 0 Dermatologic / Skin Assessment (not related to wound area) ASSESSMENTS - Focused Assessment []  - 0 Circumferential Edema Measurements - multi extremities []  - 0 Nutritional Assessment / Counseling / Intervention []  - 0 Lower Extremity Assessment (monofilament, tuning fork, pulses) []  - 0 Peripheral Arterial Disease Assessment (using hand held doppler) ASSESSMENTS - Ostomy and/or Continence Assessment and Care []  - 0 Incontinence Assessment and Management []  - 0 Ostomy Care Assessment and Management (repouching, etc.) PROCESS - Coordination of Care X - Simple Patient / Family Education for ongoing care 1 15 []  - 0 Complex (extensive) Patient / Family Education for ongoing care []  - 0 Staff obtains Programmer, systems, Records, T Results / Process Orders est []  - 0 Staff telephones HHA, Nursing Homes / Clarify orders / etc []  - 0 Routine Transfer to another Facility (non-emergent condition) []  - 0 Routine Hospital Admission (non-emergent condition) []  - 0 New Admissions / Biomedical engineer / Ordering NPWT Apligraf, etc. , []  - 0 Emergency Hospital Admission (emergent condition) X- 1 10 Simple Discharge Coordination []  - 0 Complex (extensive) Discharge Coordination PROCESS - Special Needs []  - 0 Pediatric / Minor Patient Management []  - 0 Isolation Patient Management []  - 0 Hearing /  Language / Visual special needs []  - 0 Assessment of Community assistance (transportation, D/C planning, etc.) []  - 0 Additional assistance / Altered mentation []  - 0 Support  Surface(s) Assessment (bed, cushion, seat, etc.) INTERVENTIONS - Wound Cleansing / Measurement X - Simple Wound Cleansing - one wound 1 5 []  - 0 Complex Wound Cleansing - multiple wounds []  - 0 Wound Imaging (photographs - any number of wounds) []  - 0 Wound Tracing (instead of photographs) X- 1 5 Simple Wound Measurement - one wound []  - 0 Complex Wound Measurement - multiple wounds INTERVENTIONS - Wound Dressings X - Small Wound Dressing one or multiple wounds 1 10 []  - 0 Medium Wound Dressing one or multiple wounds []  - 0 Large Wound Dressing one or multiple wounds []  - 0 Application of Medications - topical []  - 0 Application of Medications - injection INTERVENTIONS - Miscellaneous [name], Opal [name] ([id number]) [id number] Page 3 of 5 []  - 0 External ear exam []  - 0 Specimen Collection (cultures, biopsies, blood, body fluids, etc.) []  - 0 Specimen(s) / Culture(s) sent or taken to Lab for analysis []  - 0 Patient Transfer (multiple staff / [name] Lift / Similar devices) []  - 0 Simple Staple / Suture removal (25 or less) []  - 0 Complex Staple / Suture removal (26 or more) []  - 0 Hypo / Hyperglycemic Management (close monitor of Blood Glucose) []  - 0 Ankle / Brachial Index (ABI) - do not check if billed separately []  - 0 Vital Signs Has the patient been seen at the hospital within the last three years: Yes Total Score: 50 Level Of Care: New/Established - Level 2 Electronic Signature(s) Unsigned Entered ByCarlene [name] on 09/16/2022 16:04:01 -------------------------------------------------------------------------------- Encounter Discharge Information Details Patient Name: Date of Service: [name] [name]. 09/09/2022 3:30 PM Medical Record Number:  [id number] Patient Account Number: [id number] Date of Birth/Sex: Treating RN: 1966/03/13 (56 y.o. [name] Primary Care [name]: [name] Other Clinician: Referring Litha Lamartina: Treating Deeric Cruise/Extender: [name] in Treatment: 1 Encounter Discharge Information Items Discharge Condition: Stable Ambulatory Status: Wheelchair Discharge Destination: Home Transportation: Private Auto Accompanied By: wife Schedule Follow-up Appointment: Yes Clinical Summary of Care: Electronic Signature(s) Signed: 09/16/2022 4:03:38 PM By: [name] Coria RN Entered By: [name] on 09/16/2022 16:03:37 Briceno, Joyice Faster ([id number]) [id number] Page 4 of 5 -------------------------------------------------------------------------------- Wound Assessment Details Patient Name: Date of Service: [name] [name]. 09/09/2022 3:30 PM Medical Record Number: [id number] Patient Account Number: [id number] Date of Birth/Sex: Treating RN: 1966/04/27 (56 y.o. 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 Wound Open Wounding Event: Surgical Injury Status: Date Acquired: 07/19/2022 Comorbid Cataracts, Anemia, Sleep Apnea, Arrhythmia, Congestive Heart Weeks Of Treatment: 1 History: Failure, Coronary Artery Disease, Hypertension, Myocardial Clustered Wound: No Infarction, Type II Diabetes, Neuropathy, Seizure Disorder Wound Measurements Length: (cm) 11.5 Width: (cm) 1.5 Depth: (cm) 4 Area: (cm) 13.548 Volume: (cm) 54.192 % Reduction in Area: 0% % Reduction in Volume: -1233.5% Epithelialization: None Tunneling: No Undermining: No Wound Description Classification: Full Thickness Without Exposed Support  Structures Exudate Amount: Medium Exudate Type: Serosanguineous Exudate Color: red, brown Foul Odor After Cleansing: No Slough/Fibrino Yes Wound Bed Granulation Amount: Medium (34-66%) Exposed Structure Granulation Quality: Pink Fascia Exposed: No Necrotic Amount: Medium (34-66%) Fat Layer (Subcutaneous Tissue) Exposed: Yes Necrotic Quality: Adherent Slough Tendon Exposed: No Muscle Exposed: No Joint Exposed: No Bone Exposed: No Treatment Notes Wound #3 (Amputation Site - Below Knee) Wound Laterality: Left Cleanser Peri-Wound Care Topical Santyl  Collagenase Ointment, 30 (gm), tube Discharge Instruction: apply nickel thick to wound bed only Primary Dressing Gauze Discharge Instruction: moistened with normal saline over santyl Hydrofera Blue Classic Foam Rope Dressing, 9x6 (mm/in) Discharge Instruction: cut to 1/4 , pointed tip, insert into lateral aspect of wound , change every other day Secondary Dressing ABD Pad 5x9 (in/in) Discharge Instruction: Cover with ABD pad Secured With [employer] T ape ape, 2x2 (in/yd) Tubigrip Size E, 3.5x10 (in/yds) Discharge Instruction: Apply 3 Tubigrip E 3-finger-widths below knee to base of toes to secure dressing and/or for swelling. Compression Wrap Compression Stockings Add-Ons Electronic Signature(s) Signed: 09/16/2022 4:02:53 PM By: [name] Coria RN Annamaria Boots, [place] ([id number]) [id number] Page 5 of 5 Signed: 09/16/2022 4:02:53 PM By: [name] Coria RN Entered By: [name] on 09/16/2022 16:02:52

## 2022-09-16 NOTE — Progress Notes (Addendum)
[name] ([id number]) [id number] Page 1 of 10 Visit Report for 09/16/2022 Chief Complaint Document Details Patient Name: Date of Service: [name] MES L. 09/16/2022 7:45 A M Medical Record Number: [id number] Patient Account Number: [id number] Date of Birth/Sex: Treating RN: 01/03/1966 (56 y.o. Seward Meth Primary Care Provider: [name] Other Clinician: Referring Provider: Treating Provider/Extender: [name] in Treatment: 2 Information Obtained from: Patient Chief Complaint HBO evaluation for treatment on the left foot diabetic ulcer with osteomyelitis. Patient is currently under our care as well for his foot. 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 09/16/2022 7:45 A M Medical Record Number: [id number] Patient Account Number: [id number] Date of Birth/Sex: Treating RN: May 14, 1966 (56 y.o. [place] Meth Primary Care Provider: [name] Other Clinician: Referring Provider: Treating Provider/Extender: [name] in Treatment: 2 Debridement Performed for Assessment: Wound #3 Left Amputation Site - Below Knee Performed By: Physician Tommie Sams., PA-C Debridement Type: Debridement Level of Consciousness (Pre-procedure): Awake and Alert Pre-procedure Verification/Time Out Yes - 08:30 Taken: Start Time: 08:30 Pain Control: Lidocaine 4% T opical Solution T Area Debrided (L x W): otal 11.2 (cm) x 3.5 (cm) = 39.2 (cm) Tissue and other material debrided: Viable, Non-Viable, Slough, Subcutaneous, Slough Level: Skin/Subcutaneous Tissue Debridement Description:  Excisional Instrument: Curette Bleeding: Minimum Hemostasis Achieved: Pressure Response to Treatment: Procedure was tolerated well Level of Consciousness (Post- Awake and Alert procedure): [name] ([id number]) [id number] Page 2 of 10 Post Debridement Measurements of Total Wound Length: (cm) 11.2 Width: (cm) 3.5 Depth: (cm) 3.5 Volume: (cm) 107.757 Character of Wound/Ulcer Post Debridement: Stable Post Procedure Diagnosis Same as Pre-procedure Electronic Signature(s) Signed: 09/16/2022 9:00:40 AM By: Rosalio Loud MSN RN CNS WTA Signed: 09/16/2022 4:53:03 PM By: Worthy Keeler PA-C Entered By: Rosalio Loud on 09/16/2022 09:00:40 -------------------------------------------------------------------------------- HPI Details Patient Name: Date of Service: [name], JA MES L. 09/16/2022 7:45 A M Medical Record Number: [id number] Patient Account Number: [id number] Date of Birth/Sex: Treating RN: [birth year] (56 y.o. Sew[place] Meth Primary Care Provider: [name] Other Clinician: Referring Provider: Treating Provider/Extender: [name] in Treatment: 2 History of Present Illness HPI Description: 10/31/2021 patient presents today for evaluation here in our clinic. He is actually being evaluated for hyperbaric oxygen therapy only. He has been seen at [facility] up to this point he is under the care of the vascular surgery specialty. Subsequently he is also under the care of the hyperbaric center there. With that being said that due to the fact that he is a dialysis patient he was actually recommended to be transferred to [place] for his treatments he actually lives in [place] which is where his dialysis is. It was a hardship for him to try to get from [place] especially on dialysis days all the way to [facility] get into the facility for his treatment and get back home he was literally spending all day long. He has had just  4 treatments there at [facility] thus far based on what I see in these have not been continued while he awaits approval here in our clinic. Subsequently I do have records for review that will be included in the HPI predetermination review and attached to this chart as  well. I will not duplicate that here. 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 Nonetheless he continues to have exposed bone with marked soft tissue swelling compatible with osteomyelitis. This is due to the irregularity of the head of the metatarsal as well. Nonetheless along with having associated cellulitis he is also good to be seen by infectious disease and is currently on antibiotics for this as well. Again that will be detailed in the pretreatment review section which I will attach to this note as well. He does currently have a wound VAC in place and again the wound was not evaluated here in the clinic as that is being completely managed by home health and [facility]. The patient does have a history of diabetes mellitus type 2, hypertension, coronary artery disease, and congestive heart failure. He also has cataracts of both eyes but no evidence of glaucoma on his most recent eye exam. 11/22/2021 we have been seeing this gentleman for hyperbaric oxygen therapy but have not actually evaluated his wound up to this point this was being managed by the wound care and vascular team I presumed at [place] at least that is what has been told to be previous. Nonetheless at this point I got a call from Atlee Abide who is a Librarian, academic at the [facility] vascular clinic with Dr. Durene Fruits and subsequently they were wanting to know if I could take over wound care for this patient. Apparently they do not have the ability for an office debridement which again I completely understand but nonetheless  definitely is not an integral part of his healing along with the hyperbarics. Subsequently I discussed with the patient today that I do believe he would benefit going ahead and proceeding with evaluation here in the clinic for that reason I did go ahead and see him today to get things started. There is also been some confusion about the issues with his home health nurse and getting measurements to [facility] for the wound VAC in order to continue his wound VAC therapy. With that being said after I see him today we will make a determination on what to do with wound VAC we can definitely send measurements to [facility] but again the bigger question is whether this is the best way to go or not. 11/28/2021 upon evaluation today patient appears to be doing decently well in regard to his wound. He has been tolerating the dressing changes with the Dakin's moistened gauze dressing which I think is doing a good job. Fortunately I do not see any signs of active infection locally nor systemically at this point. In general I think that he is actually making excellent progress which is great news. 2/17; seen in conjunction with HBO today. 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 Much improved condition of the wound surface. On the lateral foot and eschared area that he has been using Betadine. T olerating HBO well 12/13/2021 upon evaluation today patient appears to be doing pretty well in regard to his foot ulcer. I am actually much more pleased currently that I been with the way the appearance looks today. The wound bed does show some signs of slough and biofilm on the surface of the wound this is minimal I Minna perform debridement the I will do so very carefully due to the  fact that to be honest the patient had a significant amount of bleeding in the past being on the Brilinta he had a difficult time clotting when I debrided him previously quite significantly. Fortunately I do not see any  signs of active infection at this time which is great news. No fevers, chills, nausea, vomiting, or diarrhea. 12/20/2021 upon evaluation today patient actually appears to be showing some signs of excellent improvement here. I am very pleased with where things stand DUANTE, AROCHO ([id number]) [id number] Page 3 of 10 currently. There does not appear to be any evidence of active infection locally nor systemically which is great news and I do believe that with the hyperbaric oxygen therapy he is actually making excellent progress. 3/9; patient presents for follow-up. He continues to do HBO therapy without issues. He has completed 34 out of 40 sessions. He continues to use Dakin's wet- to-dry to the amputation site and Betadine to the lateral foot wound. he currently denies signs of infection. 01/03/2022 upon evaluation today patient appears to be doing better in regard to his distal foot ulcer. Unfortunately the lateral foot is a different story this area has softened up and is getting need to be debrided away today. I Georgina Peer do this with scissors and forceps and was able to clear that out that will be detailed below. Otherwise I feel like the Dakin's moistened gauze dressing is doing an awesome job for him and he is making significantly improvements here. 01/10/2022 upon evaluation today patient continues to have some of the necrotic tissue loosen up on the lateral portion of his foot the main wound that we have been treating is actually doing quite well. Overall very pleased with where things stand in general. No fevers, chills, nausea, vomiting, or diarrhea. 01/17/2022 upon evaluation today patient appears to be doing somewhat poorly in regard to the lateral foot wound. The main wound that we initially were seeing him for looks to be doing great. This lateral foot has worsened and unfortunately it appears to potentially have some signs of infection here. I do believe that we may  need to see about getting started on some IV antibiotics he is in dialysis so they could potentially do this for him, Monday. Nonetheless I want to try to get that started ASAP. Again I would prefer him be initiated with IV vancomycin and cefepime just as an empiric broad-spectrum until we can get the culture results back at least. Subsequently also think that this should be done sooner rather than later due to the fact that his leg is more swollen and overall this seems to have progressed fairly rapidly since last week I do not want this to get worse. He voiced understanding. 01-24-2022 upon evaluation today patient appears to be doing well with regard to his main ulcer that we have been seeing him for. I am very pleased with where things stand currently. He still continues to have bleeding if we do any type of aggressive sharp debridement there for him having to be very careful using Dakin's solution along with debridement as I can here in the office. Overall the patient seems to be doing well. Unfortunately the lateral foot wound still has progressed a little bit further. We did get him on antibiotics starting this past Monday I think the drainage looks much better and overall I am seeing improvement but again this was already broken down to a certain degree I am hopeful by next week this will really start turnaround for  [place]. We will also get a send him for an x-ray to evaluate for any signs of osteomyelitis on this lateral foot. 01-31-2022 upon evaluation today patient's wound on the medial left amputation site is actually doing excellent. I am very pleased with where things stand this is significantly smaller and we are headed in the right direction. In regard to the left lateral foot there is still some necrotic tissue to be cleared away although I see evidence of this starting to basically solidify as far as the edges are concerned with how far it spread I do not see anything seeming to want to  spread further. I believe this may be close to done as far as any evolving is concerned. 02-07-2022 upon evaluation today patient appears to be doing well with regard to the main amputation site. Unfortunately the secondary site on the left lateral foot is still giving him some trouble here. There is a lot of dried tissue and I do believe that along with the Dakin's we may want to use a contact layer in the base of the wound to try to prevent the bone from drying out because we are down to that point at this time. The patient voiced understanding at this point as well and is happy to do what ever it takes to try to get this better. With that being said I do believe that it is possible he may benefit from a wound VAC. Again the question is the approval process for getting this done obviously we can see what we can do if we get things cleaned a little bit better but right now I think the best option is going to be for [place] to continue with the packing currently. 02-14-2022 upon evaluation today patient presents for follow-up concerning ongoing issues with his foot. He did have the MRI which does show that he has evidence of osteomyelitis of the digit at the fifth location of his foot. Subsequently this does extend down to the base of the digit and into the proximal portion of the metatarsal as well. With that being said the bone does not appear to be too severely broken down and a lot of the marrow signal is stated to be maintained which is good news. Nonetheless I do believe that he is unfortunately still in a spot where this is precarious and we need to make sure that we are treating this appropriately. He does seem to be doing well with IV antibiotics he also seems to be doing quite well when it comes to the overall appearance of the wound bed at this point. I am happy in that regard. 02-21-2022 upon evaluation today patient appears to be doing well all things considered in regard to his wounds. I do feel  like that he is making some progress here. The wound on the right distal foot where the amputation site is is actually healing nicely I feel like we can switch over to collagen at this point that should do well for him. In regard to the left lateral foot this is actually getting much cleaner he still has some area where there seems to be some pressure getting to the location I think that he would do well with a knee scooter at this point. I discussed that with him he does not seem prone to going that route at this point he understands it can definitely help. 02-28-2022 upon evaluation today patient appears to be doing well currently with regard to the wound on his  foot. The main surgical wound is actually significantly improved and the lateral wound is doing better there is still bone exposed but nonetheless this does not appear to be significantly worse than what we have noted previous. In fact I think it looks better than it did last week. My goal is to keep the bone from drying out and subsequently for that reason we are going to go ahead and proceed with the EpiFix which I think is good to be a good option here for him. Over top of the EpiFix we will do a contact layer in order to hopefully keep things from drying out. This dressing will actually stay in place until he comes back. Patient did see vascular yesterday and states that they did not feel like anything was doing worse still it appears that if it were left up to them they think the ideal thing would be to proceed with an amputation. I explained to the patient that to be honest is not always avoidable but if we can try to avoid amputation that is what we are after. He voiced understanding and he does want to do what ever he can to try to prevent amputation for that reason we are proceeding with the epi cord today. This will be application #1. [id number] upon evaluation today patient appears to be making some progress here regard to the lateral  foot. Fortunately there does not appear to be any signs of active infection locally or systemically which is great news. No fevers, chills, nausea, vomiting, or diarrhea. I do think that he is appropriate for second application of EpiCord today. 03-14-2022 upon evaluation today patient's wound still has significant exposure of the bone at this point. He did get crutches he has been trying to use those the past couple of days am not sure that is going so well to be perfectly honest. We did discuss however offloading is important and we even discussed a total contact cast but to be perfectly honest right now and feels like he may need more debridement to clear away some of the bone which is not viable in the end of the foot of the fifth metatarsal but I am unable to really do that here in the office due to his bleeding. I do not know if potentially we could have him seen by podiatry potentially Dr. Merril Abbe to see if there is anything he can do to help in this regard. I am not even certain whether the majority of the metatarsal is even viable to be honest. Either way I do think that he would benefit from surgical debridement or even possibly partial ray amputation in order for [place] to try to keep things moving in the right direction and get this closed. This was all discussed with the patient today. With that being said I know he wants to avoid a below-knee amputation which is what has previously been recommended. 03-21-2022 upon evaluation today patient appears to be doing well in regard to the medial wound at this is shown signs of excellent improvement. In regard to the lateral wound this is a bit dry compared to what I want to see him Regino Schultze not draining as much but I think that we need to see what we can do about trying to keep this from drying out and keeping it a bit more moist. Patient voiced understanding. Overall I am very pleased otherwise with where we stand. 03-27-2022 upon evaluation today  patient appears to be doing well currently  in regard to his wound especially on the medial aspect this looks really good the lateral aspect is looking a little bit better to with some healthier appearance to the bone compared to last week to be honest. Fortunately I do not see any signs of active infection locally or systemically at this time which is great news. No fevers, chills, nausea, vomiting, or diarrhea. READMISSION 08/28/2022 This is a 56 year old man who is a diabetic on dialysis but not currently on any treatment for it. He has a known history of PAD. He was in this clinic for a protracted period of time this year for a left foot wound with underlying osteomyelitis for which she was treated with hyperbaric oxygen. As I understand things he left the clinic in June with wounds and an unhealed state possibly infected. 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 Unfortunately in September he required a revision of the BKA but he was ultimately admitted to [facility] for a protracted period with bacteremia secondary to group A strep from 9/12-10/12. Marland Kitchen He is recently completed vancomycin he was taking at dialysis He has been left with a nonhealing surgical wound on his left BKA amputation site. He recently followed with vascular surgery who recommended [place] and [place], Munday L ([id number]) [id number] Page 4 of 10 Vashe solution which they have been applying to the wound every day. It is noted that he does have severe PAD in the right lower extremity that is status post transmetatarsal amputation he is going to follow him up in 3 months. He is also seen infectious disease at [facility] who state that he had MRSA treated with cefazolin and vancomycin at hemodialysis. However he is listed as having an allergic reaction to cefazolin including lip swelling therefore he completed vancomycin. He is not felt to require any  additional antibiotics. 09-04-2022 upon evaluation today patient appears to be doing well currently in regard to his wound on the medial aspect of this is amputation site where he had a BKA. On the lateral aspect however he has an area of the incision line that is open that actually goes somewhat deep. We did probe about 4 cm down although I do not feel any bone directly. Fortunately there does not appear to be any signs of active infection locally or systemically at this time. There is some concern here in my estimation about the possibility of infection and I think that he may need some extension of the antibiotics I Georgina Peer probably put him on linezolid as he was previously on vancomycin. 09-16-2022 upon evaluation today patient appears to be doing well currently in regard to his wound. There is good to be some need for sharp debridement today but fortunately nothing too significant. Fortunately I do not see any signs of active infection locally nor systemically at this time which is great news. No fevers, chills, nausea, vomiting, or diarrhea. Electronic Signature(s) Signed: 09/16/2022 3:33:11 PM By: Worthy Keeler PA-C Entered By: Worthy Keeler on 09/16/2022 15:33:11 -------------------------------------------------------------------------------- Physical Exam Details Patient Name: Date of Service: [name] MES L. 09/16/2022 7:45 A M Medical Record Number: [id number] Patient Account Number: [id number] Date of Birth/Sex: Treating RN: 09/29/1966 (56 y.o. Seward Meth Primary Care Provider: [name] Other Clinician: Referring Provider: Treating Provider/Extender: [name] in Treatment: 2 Constitutional Well-nourished and well-hydrated in no acute distress. Respiratory normal breathing without difficulty.  Psychiatric this patient is able to make decisions and demonstrates good insight into disease process. Alert and Oriented x 3. pleasant and  cooperative. Notes Upon inspection patient's wound bed actually showed signs of good granulation and epithelization at this point. Fortunately I think that he is making progress although we still have a ways to go to get this completely closed. Electronic Signature(s) Signed: 09/16/2022 3:33:27 PM By: Worthy Keeler PA-C Entered By: Worthy Keeler on 09/16/2022 15:33:27 -------------------------------------------------------------------------------- Physician Orders Details Patient Name: Date of Service: [name], [place] MES L. 09/16/2022 7:45 A M Medical Record Number: [id number] Patient Account Number: [id number] TIMM, BONENBERGER ([id number]) [id number] Page 5 of 10 Date of Birth/Sex: Treating RN: [birth year] (57 y.o. Seward Meth Primary Care Provider: Other Clinician: [name] Referring Provider: Treating Provider/Extender: [name] in Treatment: 2 Verbal / Phone Orders: No Diagnosis Coding ICD-10 Coding Code Description [phone] Non-pressure chronic ulcer of other part of left lower leg with other specified severity Z89.512 Acquired absence of left leg below knee E11.51 Type 2 diabetes mellitus with diabetic peripheral angiopathy without gangrene L03.116 Cellulitis of left lower limb Follow-up Appointments Return Appointment in 1 week. [place] for wound care. May utilize formulary equivalent dressing for wound treatment orders unless otherwise specified. Home Health Nurse may visit PRN to address patients wound care needs. Jackquline [place] [phone] Bathing/ Shower/ Hygiene May shower; gently cleanse wound with antibacterial soap, rinse and pat dry prior to dressing wounds Edema Control - Lymphedema / Segmental Compressive Device / Other Tubigrip double layer applied - to stump and above knee Wound Treatment Wound #3 - Amputation Site - Below Knee Wound Laterality: Left Topical: Santyl Collagenase  Ointment, 30 (gm), tube 1 x Per Day/30 Days Discharge Instructions: apply nickel thick to wound bed only Prim Dressing: Gauze 1 x Per Day/30 Days ary Discharge Instructions: moistened with normal saline over santyl Prim Dressing: Hydrofera Blue Classic Foam Rope Dressing, 9x6 (mm/in) 1 x Per Day/30 Days ary Discharge Instructions: cut to 1/4 , pointed tip, insert into lateral aspect of wound , change every other day Secondary Dressing: ABD Pad 5x9 (in/in) 1 x Per Day/30 Days Discharge Instructions: Cover with ABD pad Secured With: Medipore T - 73M Medipore H Soft Cloth Surgical T ape ape, 2x2 (in/yd) 1 x Per Day/30 Days Secured With: Tubigrip Size E, 3.5x10 (in/yds) 1 x Per Day/30 Days Discharge Instructions: Apply 3 Tubigrip E 3-finger-widths below knee to base of toes to secure dressing and/or for swelling. Electronic Signature(s) Signed: 09/16/2022 9:01:00 AM By: Rosalio Loud MSN RN CNS WTA Signed: 09/16/2022 4:53:03 PM By: Worthy Keeler PA-C Entered By: Rosalio Loud on 09/16/2022 09:00:59 -------------------------------------------------------------------------------- Problem List Details Patient Name: Date of Service: [name], [name] MES L. 09/16/2022 7:45 A M Medical Record Number: [id number] Patient Account Number: [id number] Date of Birth/Sex: Treating RN: 13-Feb-1966 (56 y.o. Seward Meth Primary Care Provider: [name] Other Clinician: Referring Provider: Treating Provider/Extender: Dorothea Glassman Woodson, Childress ([id number]) [id number] Page 6 of 10 Weeks in Treatment: 2 Active Problems ICD-10 Encounter Code Description Active Date MDM Diagnosis L97.828 Non-pressure chronic ulcer of other part of left lower leg with other specified 08/28/2022 No Yes severity Z89.512 Acquired absence of left leg below knee 08/28/2022 No Yes E11.51 Type 2 diabetes mellitus with diabetic peripheral angiopathy without gangrene 08/28/2022 No  Yes L03.116 Cellulitis of left lower limb 08/28/2022 No Yes Inactive Problems Resolved Problems Electronic Signature(s) Signed: 09/16/2022 8:17:01  AM By: Worthy Keeler PA-C Entered By: Worthy Keeler on 09/16/2022 08:17:01 -------------------------------------------------------------------------------- Progress Note Details Patient Name: Date of Service: [name] MES L. 09/16/2022 7:45 A M Medical Record Number: [id number] Patient Account Number: [id number] Date of Birth/Sex: Treating RN: 1966/03/30 (56 y.o. Seward Meth Primary Care Provider: [name] Other Clinician: Referring Provider: Treating Provider/Extender: [name] in Treatment: 2 Subjective Chief Complaint Information obtained from Patient HBO evaluation for treatment on the left foot diabetic ulcer with osteomyelitis. Patient is currently under our care as well for his foot. 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 He is actually being evaluated for hyperbaric oxygen therapy only. He has been seen at [facility] up to this point he is under the care of the vascular surgery specialty. Subsequently he is also under the care of the hyperbaric center there. With that being said that due to the fact that he is a dialysis patient he was actually recommended to be transferred to [place] for his treatments he actually lives in [place] which is where his dialysis is. It was a hardship for him to try to get from [place] especially on dialysis days all the way to [facility] get into the facility for his treatment and get back home he was literally spending all day long. He has had just 4 treatments there at [facility] thus far based on what I see in these have not been continued while he awaits approval here in our clinic. Subsequently I do have records for  review that will be included in the HPI predetermination review and attached to this chart as well. I will not duplicate that here. 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 Nonetheless he continues to have exposed bone with marked soft tissue swelling compatible with osteomyelitis. This is due to the irregularity of the head of the metatarsal as well. Nonetheless along with having associated cellulitis he is also good to be seen by infectious disease and is currently on antibiotics for this as well. Again that will be detailed in the pretreatment review section which I will attach to this note as well. He does currently have a wound VAC in place and again the wound was not evaluated here in the clinic as that is being completely managed by home health and [facility]. SALATHIEL, FERRARA ([id number]) [id number] Page 7 of 10 The patient does have a history of diabetes mellitus type 2, hypertension, coronary artery disease, and congestive heart failure. He also has cataracts of both eyes but no evidence of glaucoma on his most recent eye exam. 11/22/2021 we have been seeing this gentleman for hyperbaric oxygen therapy but have not actually evaluated his wound up to this point this was being managed by the wound care and vascular team I presumed at [facility] at least that is what has been told to be previous. Nonetheless at this point I got a call from Atlee Abide who is a Librarian, academic at the [facility] vascular clinic with Dr. Durene Fruits and subsequently they were wanting to know if I could take over wound care for this patient. Apparently they do not have the ability for an office debridement which again I completely  understand but nonetheless definitely is not an integral part of his healing along with the hyperbarics.  Subsequently I discussed with the patient today that I do believe he would benefit going ahead and proceeding with evaluation here in the clinic for that reason I did go ahead and see him today to get things started. There is also been some confusion about the issues with his home health nurse and getting measurements to [facility] for the wound VAC in order to continue his wound VAC therapy. With that being said after I see him today we will make a determination on what to do with wound VAC we can definitely send measurements to [facility] but again the bigger question is whether this is the best way to go or not. 11/28/2021 upon evaluation today patient appears to be doing decently well in regard to his wound. He has been tolerating the dressing changes with the Dakin's moistened gauze dressing which I think is doing a good job. Fortunately I do not see any signs of active infection locally nor systemically at this point. In general I think that he is actually making excellent progress which is great news. 2/17; seen in conjunction with HBO today. 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 Much improved condition of the wound surface. On the lateral foot and eschared area that he has been using Betadine. T olerating HBO well 12/13/2021 upon evaluation today patient appears to be doing pretty well in regard to his foot ulcer. I am actually much more pleased currently that I been with the way the appearance looks today. The wound bed does show some signs of slough and biofilm on the surface of the wound this is minimal I Minna perform debridement the I will do so very carefully due to the fact that to be honest the patient had a significant amount of bleeding in the past being on the Brilinta he had a difficult time clotting when I debrided him previously quite significantly. Fortunately I do not see any signs of active infection at this time which is great news. No fevers, chills,  nausea, vomiting, or diarrhea. 12/20/2021 upon evaluation today patient actually appears to be showing some signs of excellent improvement here. I am very pleased with where things stand currently. There does not appear to be any evidence of active infection locally nor systemically which is great news and I do believe that with the hyperbaric oxygen therapy he is actually making excellent progress. 3/9; patient presents for follow-up. He continues to do HBO therapy without issues. He has completed 34 out of 40 sessions. He continues to use Dakin's wet- to-dry to the amputation site and Betadine to the lateral foot wound. he currently denies signs of infection. 01/03/2022 upon evaluation today patient appears to be doing better in regard to his distal foot ulcer. Unfortunately the lateral foot is a different story this area has softened up and is getting need to be debrided away today. I Georgina Peer do this with scissors and forceps and was able to clear that out that will be detailed below. Otherwise I feel like the Dakin's moistened gauze dressing is doing an awesome job for him and he is making significantly improvements here. 01/10/2022 upon evaluation today patient continues to have some of the necrotic tissue loosen up on the lateral portion of his foot the main wound that we have been treating is actually doing quite well. Overall very pleased with where things stand in  general. No fevers, chills, nausea, vomiting, or diarrhea. 01/17/2022 upon evaluation today patient appears to be doing somewhat poorly in regard to the lateral foot wound. The main wound that we initially were seeing him for looks to be doing great. This lateral foot has worsened and unfortunately it appears to potentially have some signs of infection here. I do believe that we may need to see about getting started on some IV antibiotics he is in dialysis so they could potentially do this for him, Monday. Nonetheless I want to try to get  that started ASAP. Again I would prefer him be initiated with IV vancomycin and cefepime just as an empiric broad-spectrum until we can get the culture results back at least. Subsequently also think that this should be done sooner rather than later due to the fact that his leg is more swollen and overall this seems to have progressed fairly rapidly since last week I do not want this to get worse. He voiced understanding. 01-24-2022 upon evaluation today patient appears to be doing well with regard to his main ulcer that we have been seeing him for. I am very pleased with where things stand currently. He still continues to have bleeding if we do any type of aggressive sharp debridement there for him having to be very careful using Dakin's solution along with debridement as I can here in the office. Overall the patient seems to be doing well. Unfortunately the lateral foot wound still has progressed a little bit further. We did get him on antibiotics starting this past Monday I think the drainage looks much better and overall I am seeing improvement but again this was already broken down to a certain degree I am hopeful by next week this will really start turnaround for [place]. We will also get a send him for an x-ray to evaluate for any signs of osteomyelitis on this lateral foot. 01-31-2022 upon evaluation today patient's wound on the medial left amputation site is actually doing excellent. I am very pleased with where things stand this is significantly smaller and we are headed in the right direction. In regard to the left lateral foot there is still some necrotic tissue to be cleared away although I see evidence of this starting to basically solidify as far as the edges are concerned with how far it spread I do not see anything seeming to want to spread further. I believe this may be close to done as far as any evolving is concerned. 02-07-2022 upon evaluation today patient appears to be doing well with  regard to the main amputation site. Unfortunately the secondary site on the left lateral foot is still giving him some trouble here. There is a lot of dried tissue and I do believe that along with the Dakin's we may want to use a contact layer in the base of the wound to try to prevent the bone from drying out because we are down to that point at this time. The patient voiced understanding at this point as well and is happy to do what ever it takes to try to get this better. With that being said I do believe that it is possible he may benefit from a wound VAC. Again the question is the approval process for getting this done obviously we can see what we can do if we get things cleaned a little bit better but right now I think the best option is going to be for [place] to continue with the packing currently.  02-14-2022 upon evaluation today patient presents for follow-up concerning ongoing issues with his foot. He did have the MRI which does show that he has evidence of osteomyelitis of the digit at the fifth location of his foot. Subsequently this does extend down to the base of the digit and into the proximal portion of the metatarsal as well. With that being said the bone does not appear to be too severely broken down and a lot of the marrow signal is stated to be maintained which is good news. Nonetheless I do believe that he is unfortunately still in a spot where this is precarious and we need to make sure that we are treating this appropriately. He does seem to be doing well with IV antibiotics he also seems to be doing quite well when it comes to the overall appearance of the wound bed at this point. I am happy in that regard. 02-21-2022 upon evaluation today patient appears to be doing well all things considered in regard to his wounds. I do feel like that he is making some progress here. The wound on the right distal foot where the amputation site is is actually healing nicely I feel like we can switch  over to collagen at this point that should do well for him. In regard to the left lateral foot this is actually getting much cleaner he still has some area where there seems to be some pressure getting to the location I think that he would do well with a knee scooter at this point. I discussed that with him he does not seem prone to going that route at this point he understands it can definitely help. 02-28-2022 upon evaluation today patient appears to be doing well currently with regard to the wound on his foot. The main surgical wound is actually significantly improved and the lateral wound is doing better there is still bone exposed but nonetheless this does not appear to be significantly worse than what we have noted previous. In fact I think it looks better than it did last week. My goal is to keep the bone from drying out and subsequently for that reason we are going to go ahead and proceed with the EpiFix which I think is good to be a good option here for him. Over top of the EpiFix we will do a contact layer in order to hopefully keep things from drying out. This dressing will actually stay in place until he comes back. Patient did see vascular yesterday and states that they did not feel like anything was doing worse still it appears that if it were left up to them they think the ideal thing would be to proceed with an amputation. I explained to the patient that to be honest is not always avoidable but if we can try to avoid amputation that is what we are after. He voiced understanding and he does want to do what ever he can to try to prevent amputation for that reason we are proceeding with the epi cord today. This will be application #1. 01-10-5572 upon evaluation today patient appears to be making some progress here regard to the lateral foot. Fortunately there does not appear to be any signs of active infection locally or systemically which is great news. No fevers, chills, nausea, vomiting,  or diarrhea. I do think that he is appropriate for second application of EpiCord today. DOMINICO, ROD ([id number]) [id number] Page 8 of 10 03-14-2022 upon evaluation today patient's wound still has significant exposure  of the bone at this point. He did get crutches he has been trying to use those the past couple of days am not sure that is going so well to be perfectly honest. We did discuss however offloading is important and we even discussed a total contact cast but to be perfectly honest right now and feels like he may need more debridement to clear away some of the bone which is not viable in the end of the foot of the fifth metatarsal but I am unable to really do that here in the office due to his bleeding. I do not know if potentially we could have him seen by podiatry potentially Dr. Merril Abbe to see if there is anything he can do to help in this regard. I am not even certain whether the majority of the metatarsal is even viable to be honest. Either way I do think that he would benefit from surgical debridement or even possibly partial ray amputation in order for [place] to try to keep things moving in the right direction and get this closed. This was all discussed with the patient today. With that being said I know he wants to avoid a below-knee amputation which is what has previously been recommended. 03-21-2022 upon evaluation today patient appears to be doing well in regard to the medial wound at this is shown signs of excellent improvement. In regard to the lateral wound this is a bit dry compared to what I want to see him Regino Schultze not draining as much but I think that we need to see what we can do about trying to keep this from drying out and keeping it a bit more moist. Patient voiced understanding. Overall I am very pleased otherwise with where we stand. 03-27-2022 upon evaluation today patient appears to be doing well currently in regard to his wound especially on  the medial aspect this looks really good the lateral aspect is looking a little bit better to with some healthier appearance to the bone compared to last week to be honest. Fortunately I do not see any signs of active infection locally or systemically at this time which is great news. No fevers, chills, nausea, vomiting, or diarrhea. READMISSION 08/28/2022 This is a 56 year old man who is a diabetic on dialysis but not currently on any treatment for it. He has a known history of PAD. He was in this clinic for a protracted period of time this year for a left foot wound with underlying osteomyelitis for which she was treated with hyperbaric oxygen. As I understand things he left the clinic in June with wounds and an unhealed state possibly infected. 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 Unfortunately in September he required a revision of the BKA but he was ultimately admitted to [facility] for a protracted period with bacteremia secondary to group A strep from 9/12-10/12. Marland Kitchen He is recently completed vancomycin he was taking at dialysis He has been left with a nonhealing surgical wound on his left BKA amputation site. He recently followed with vascular surgery who recommended Santyl and Vashe solution which they have been applying to the wound every day. It is noted that he does have severe PAD in the right lower extremity that is status post transmetatarsal amputation he is going to follow him up in 3 months. He is also seen infectious disease at [facility] who state that he had MRSA  treated with cefazolin and vancomycin at hemodialysis. However he is listed as having an allergic reaction to cefazolin including lip swelling therefore he completed vancomycin. He is not felt to require any additional antibiotics. 09-04-2022 upon evaluation today patient appears to be doing well currently in regard to his wound on the medial aspect of this is  amputation site where he had a BKA. On the lateral aspect however he has an area of the incision line that is open that actually goes somewhat deep. We did probe about 4 cm down although I do not feel any bone directly. Fortunately there does not appear to be any signs of active infection locally or systemically at this time. There is some concern here in my estimation about the possibility of infection and I think that he may need some extension of the antibiotics I Georgina Peer probably put him on linezolid as he was previously on vancomycin. 09-16-2022 upon evaluation today patient appears to be doing well currently in regard to his wound. There is good to be some need for sharp debridement today but fortunately nothing too significant. Fortunately I do not see any signs of active infection locally nor systemically at this time which is great news. No fevers, chills, nausea, vomiting, or diarrhea. Objective Constitutional Well-nourished and well-hydrated in no acute distress. Vitals Time Taken: 8:02 AM, Height: 70 in, Weight: 166 lbs, BMI: 23.8, Temperature: 98.3 F, Pulse: 72 bpm, Respiratory Rate: 18 breaths/min, Blood Pressure: 164/99 mmHg. Respiratory normal breathing without difficulty. Psychiatric this patient is able to make decisions and demonstrates good insight into disease process. Alert and Oriented x 3. pleasant and cooperative. General Notes: Upon inspection patient's wound bed actually showed signs of good granulation and epithelization at this point. Fortunately I think that he is making progress although we still have a ways to go to get this completely closed. Integumentary (Hair, Skin) Wound #3 status is Open. Original cause of wound was Surgical Injury. The date acquired was: 07/19/2022. The wound has been in treatment 2 weeks. 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 The wound measures 11.2cm length x 3.5cm width x 3cm depth; 30.788cm^2 area and  92.363cm^3 volume. There is Fat Layer (Subcutaneous Tissue) exposed. There is a medium amount of serosanguineous drainage noted. There is medium (34-66%) pink granulation within the wound bed. There is a medium (34-66%) amount of necrotic tissue within the wound bed including Adherent Slough. 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 There was a Excisional Skin/Subcutaneous Tissue Debridement with a total area of 39.2 sq cm performed by Tommie Sams., PA-C. With the following instrument(s): Curette to remove Viable and Non- Viable tissue/material. Material removed includes Subcutaneous Tissue and Slough and after achieving pain control using Lidocaine 4% Topical Solution. No specimens were taken. A time out was conducted at 08:30, prior to the start of the procedure. A Minimum amount of bleeding was controlled with Pressure. The procedure was tolerated well. Post Debridement Measurements: 11.2cm length x 3.5cm width x 3.5cm depth; 107.757cm^3 volume. Character of Wound/Ulcer Post Debridement is stable. Post procedure Diagnosis Wound #3: Same as Pre-Procedure Plan  Follow-up Appointments: Return Appointment in 1 week. Home Health: [facility] for wound care. May utilize formulary equivalent dressing for wound treatment orders unless otherwise specified. Home Health Nurse may visit PRN to address patientoos wound care needs. Jackquline Denmark [phone] Bathing/ Shower/ Hygiene: May shower; gently cleanse wound with antibacterial soap, rinse and pat dry prior  to dressing wounds Edema Control - Lymphedema / Segmental Compressive Device / Other: Tubigrip double layer applied - to stump and above knee WOUND #3: - Amputation Site - Below Knee Wound Laterality: Left Topical: Santyl Collagenase Ointment, 30 (gm), tube 1 x Per Day/30 Days Discharge Instructions: apply nickel thick to wound bed only Prim Dressing: Gauze 1 x Per Day/30 Days ary Discharge Instructions: moistened with normal saline over santyl Prim Dressing: Hydrofera Blue Classic Foam Rope Dressing, 9x6 (mm/in) 1 x Per Day/30 Days ary Discharge Instructions: cut to 1/4 , pointed tip, insert into lateral aspect of wound , change every other day Secondary Dressing: ABD Pad 5x9 (in/in) 1 x Per Day/30 Days Discharge Instructions: Cover with ABD pad Secured With: Medipore T - 45M Medipore H Soft Cloth Surgical T ape ape, 2x2 (in/yd) 1 x Per Day/30 Days Secured With: Tubigrip Size E, 3.5x10 (in/yds) 1 x Per Day/30 Days Discharge Instructions: Apply 3 Tubigrip E 3-finger-widths below knee to base of toes to secure dressing and/or for swelling. 1. I am going to suggest that we have the patient continue with the Santyl for the time being on the main open area. I think this is helping to clean this up quite nicely. 2. Will continue with the Hydrofera Blue rope to the more lateral portion of the wound which is open I think that this should do well as well. We will see patient back for reevaluation in 1 week here in the clinic. If anything worsens or changes patient will contact our office for additional recommendations. Electronic Signature(s) Signed: 09/16/2022 3:33:54 PM By: Worthy Keeler PA-C Entered By: Worthy Keeler on 09/16/2022 15:33:54 -------------------------------------------------------------------------------- SuperBill Details Patient Name: Date of Service: [name], Pickrell MES L. 09/16/2022 Medical Record Number: [id number] Patient Account Number: [id number] Date of Birth/Sex:  Treating RN: 1966-05-12 (56 y.o. 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 E11.51 Type 2 diabetes mellitus with diabetic peripheral angiopathy without gangrene L03.116 Cellulitis of left lower limb Facility Procedures : CPT4 Code: [id number] Description: 90240 - DEB SUBQ TISSUE 20 SQ CM/< ICD-10 Diagnosis Description L97.828 Non-pressure chronic ulcer of other part of left lower leg with other specified Modifier: severity Quantity: 1 : CPT4 Code: 97353299 Description: 24268 - DEB SUBQ TISS EA ADDL 20CM ICD-10 Diagnosis Description L97.828 Non-pressure chronic ulcer of other part of left lower leg with other specified Modifier: severity Quantity: 1 Physician Procedures : CPT4 Code Description Modifier [id number] 11042 - WC PHYS SUBQ TISS 20 SQ CM ICD-10 Diagnosis Description L97.828 Non-pressure chronic ulcer of other part of left lower leg with other specified severity Quantity: 1 : [id number] 11941 - WC PHYS SUBQ TISS EA ADDL 20 CM ICD-10 Diagnosis Description L97.828 Non-pressure chronic ulcer of other part of left lower leg with other specified severity Quantity: 1 Electronic Signature(s) Signed: 09/16/2022 3:34:07 PM By: Melburn Hake,  Cierah Crader PA-C Entered By: Worthy Keeler on 09/16/2022 15:34:06

## 2022-09-17 DIAGNOSIS — I132 Hypertensive heart and chronic kidney disease with heart failure and with stage 5 chronic kidney disease, or end stage renal disease: Secondary | ICD-10-CM | POA: Diagnosis not present

## 2022-09-17 DIAGNOSIS — Z992 Dependence on renal dialysis: Secondary | ICD-10-CM | POA: Diagnosis not present

## 2022-09-17 DIAGNOSIS — I959 Hypotension, unspecified: Secondary | ICD-10-CM | POA: Diagnosis not present

## 2022-09-17 DIAGNOSIS — D631 Anemia in chronic kidney disease: Secondary | ICD-10-CM | POA: Diagnosis not present

## 2022-09-17 DIAGNOSIS — N186 End stage renal disease: Secondary | ICD-10-CM | POA: Diagnosis not present

## 2022-09-17 DIAGNOSIS — I5020 Unspecified systolic (congestive) heart failure: Secondary | ICD-10-CM | POA: Diagnosis not present

## 2022-09-17 DIAGNOSIS — E1122 Type 2 diabetes mellitus with diabetic chronic kidney disease: Secondary | ICD-10-CM | POA: Diagnosis not present

## 2022-09-17 DIAGNOSIS — T8781 Dehiscence of amputation stump: Secondary | ICD-10-CM | POA: Diagnosis not present

## 2022-09-17 DIAGNOSIS — I96 Gangrene, not elsewhere classified: Secondary | ICD-10-CM | POA: Diagnosis not present

## 2022-09-17 DIAGNOSIS — N2581 Secondary hyperparathyroidism of renal origin: Secondary | ICD-10-CM | POA: Diagnosis not present

## 2022-09-19 DIAGNOSIS — N2581 Secondary hyperparathyroidism of renal origin: Secondary | ICD-10-CM | POA: Diagnosis not present

## 2022-09-19 DIAGNOSIS — N186 End stage renal disease: Secondary | ICD-10-CM | POA: Diagnosis not present

## 2022-09-19 DIAGNOSIS — Z992 Dependence on renal dialysis: Secondary | ICD-10-CM | POA: Diagnosis not present

## 2022-09-19 DIAGNOSIS — E1129 Type 2 diabetes mellitus with other diabetic kidney complication: Secondary | ICD-10-CM | POA: Diagnosis not present

## 2022-09-19 DIAGNOSIS — D631 Anemia in chronic kidney disease: Secondary | ICD-10-CM | POA: Diagnosis not present

## 2022-09-19 DIAGNOSIS — I96 Gangrene, not elsewhere classified: Secondary | ICD-10-CM | POA: Diagnosis not present

## 2022-09-19 NOTE — Progress Notes (Signed)
JUJUAN, DUGO ([id number]) [id number] Page 1 of 2 Visit Report for 09/09/2022 Physician Orders Details Patient Name: Date of Service: [name] MES L. 09/09/2022 3:30 PM Medical Record Number: [id number] Patient Account Number: [id number] Date of Birth/Sex: Treating RN: Dec 05, 1965 (56 y.o. [name] Primary Care Provider: [name] Other Clinician: Referring Provider: Treating Provider/Extender: [name] Verbal / Phone Orders: No Diagnosis Coding Follow-up Appointments Return Appointment in 1 week. [place] for wound care. May utilize formulary equivalent dressing for wound treatment orders unless otherwise specified. Home Health Nurse may visit PRN to address patients wound care needs. Jackquline Denmark [phone] Bathing/ Shower/ Hygiene May shower; gently cleanse wound with antibacterial soap, rinse and pat dry prior to dressing wounds Edema Control - Lymphedema / Segmental Compressive Device / Other Tubigrip double layer applied - to stump and above knee Wound Treatment Wound #3 - Amputation Site - Below Knee Wound Laterality: Left Topical: Santyl Collagenase Ointment, 30 (gm), tube 1 x Per Day/30 Days Discharge Instructions: apply nickel thick to wound bed only Prim Dressing: Gauze 1 x Per Day/30 Days ary Discharge Instructions: moistened with normal saline over santyl Prim Dressing: Hydrofera Blue Classic Foam Rope Dressing, 9x6 (mm/in) 1 x Per Day/30 Days ary Discharge Instructions: cut to 1/4 , pointed tip, insert into lateral aspect of wound , change every other day Secondary Dressing: ABD Pad 5x9 (in/in) 1 x Per Day/30 Days Discharge Instructions: Cover with ABD pad Secured With: Medipore T - 33M Medipore H Soft Cloth Surgical T ape ape, 2x2 (in/yd) 1 x Per Day/30 Days Secured With: Tubigrip Size E, 3.5x10 (in/yds) 1 x Per Day/30 Days Discharge Instructions: Apply 3  Tubigrip E 3-finger-widths below knee to base of toes to secure dressing and/or for swelling. Electronic Signature(s) Signed: 09/16/2022 4:03:12 PM By: [name] RN Signed: 09/18/2022 5:46:53 PM By: Worthy Keeler PA-C Entered By: [name] on 09/16/2022 16:03:12 [name], [name] ([id number]) [id number] Page 2 of 2 -------------------------------------------------------------------------------- SuperBill Details Patient Name: Date of Service: [name] MES L. 09/09/2022 Medical Record Number: [id number] Patient Account Number: [id number] Date of Birth/Sex: Treating RN: 01-Jan-1966 (56 y.o. 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

## 2022-09-22 ENCOUNTER — Ambulatory Visit: Payer: Self-pay | Admitting: Licensed Clinical Social Worker

## 2022-09-22 DIAGNOSIS — N2581 Secondary hyperparathyroidism of renal origin: Secondary | ICD-10-CM | POA: Diagnosis not present

## 2022-09-22 DIAGNOSIS — N186 End stage renal disease: Secondary | ICD-10-CM | POA: Diagnosis not present

## 2022-09-22 DIAGNOSIS — D631 Anemia in chronic kidney disease: Secondary | ICD-10-CM | POA: Diagnosis not present

## 2022-09-22 DIAGNOSIS — Z992 Dependence on renal dialysis: Secondary | ICD-10-CM | POA: Diagnosis not present

## 2022-09-22 DIAGNOSIS — I96 Gangrene, not elsewhere classified: Secondary | ICD-10-CM | POA: Diagnosis not present

## 2022-09-22 NOTE — Patient Instructions (Signed)
Visit Information  Thank you for taking time to visit with me today. Please don't hesitate to contact me if I can be of assistance to you before our next scheduled telephone appointment.  Following are the goals we discussed today:   Our next appointment is by telephone on 10/21/22 at 2:30 PM   Please call the care guide team at [phone] if you need to cancel or reschedule your appointment.   If you are experiencing a Mental Health or [place] or need someone to talk to, please go to [facility] [place] [phone])   Following is a copy of your full plan of care:   Care Coordination Interventions:  Active listening used Discussed client needs Provided counseling support.  Discussed ADLs completion. He said he is doing well with ADLs completion Discussed wound care. He said he has wound care nurse that  visits him at his home 3 times weekly to care for wound area of client.  Discussed uses of ramp to go in and out of home. Discussed use of stair lift to help him go up and down stairs Discussed RN support services with Care Coordination program    Discussed support of his spouse.  Reviewed medication procurement.  Discussed dialysis treatments received weekly. Encouraged client to call LCSW as needed for SW support at phone number [phone].   Mr. Agostino was given information about Care Management services by the embedded care coordination team including:  Care Management services include personalized support from designated clinical staff supervised by his physician, including individualized plan of care and coordination with other care providers 24/7 contact phone numbers for assistance for urgent and routine care needs. The patient may stop CCM services at any time (effective at the end of the month) by phone call to the office staff.  Patient agreed to services and verbal consent obtained.   Norva Riffle.Wyat Infinger MSW, [place] Holiday representative [facility] Care Management [phone]

## 2022-09-22 NOTE — Patient Outreach (Signed)
  Care Coordination   Follow Up Visit Note   09/22/2022 Name: [name] MRN: [id number] DOB: Nov 15, 1965  [name] is a 56 y.o. year old male who sees [name], MD for primary care. I spoke with  [name] by phone today.  What matters to the patients health and wellness today? Wants to be able to go in and out of his home using ramp. He wants to go up and down stairs at this home. Wants to complete daily ADLs    Goals Addressed               This Visit's Progress     Patient will be able to get in and out of his home and get up and down stairs (pt-stated)        Care Coordination Interventions:  Active listening used Discussed client needs Provided counseling support.  Discussed ADLs completion. He said he is doing well with ADLs completion Discussed wound care. He said he has wound care nurse that  visits him at his home 3 times weekly to care for wound area of client.  Discussed uses of ramp to go in and out of home. Discussed use of stair lift to help him go up and down stairs Discussed RN support services with Care Coordination program    Discussed support of his spouse.  Reviewed medication procurement.  Discussed dialysis treatments received weekly. Encouraged client to call LCSW as needed for SW support at phone number [phone].        SDOH assessments and interventions completed:  Yes  SDOH Interventions Today    Flowsheet Row Most Recent Value  SDOH Interventions   Depression Interventions/Treatment  Counseling  [client has some stress and some sadness in managing multiple medical needs]  Physical Activity Interventions Other (Comments)  [BKA,  receiving wound care on surgical site as needed]  Stress Interventions Provide Counseling  [client has stress related to managing medical needs]        Care Coordination Interventions:  Yes, provided   Follow up plan: Follow up call scheduled for 10/21/22 at 2:30 PM     Encounter Outcome:  Pt.  Visit Completed   Norva Riffle.Cariah Salatino MSW, [place] Holiday representative [facility] Care Management [phone]

## 2022-09-23 ENCOUNTER — Encounter: Payer: Medicare Other | Attending: Physician Assistant | Admitting: Physician Assistant

## 2022-09-23 DIAGNOSIS — I110 Hypertensive heart disease with heart failure: Secondary | ICD-10-CM | POA: Diagnosis not present

## 2022-09-23 DIAGNOSIS — I10 Essential (primary) hypertension: Secondary | ICD-10-CM | POA: Diagnosis not present

## 2022-09-23 DIAGNOSIS — I2510 Atherosclerotic heart disease of native coronary artery without angina pectoris: Secondary | ICD-10-CM | POA: Insufficient documentation

## 2022-09-23 DIAGNOSIS — L97828 Non-pressure chronic ulcer of other part of left lower leg with other specified severity: Secondary | ICD-10-CM | POA: Diagnosis not present

## 2022-09-23 DIAGNOSIS — E1122 Type 2 diabetes mellitus with diabetic chronic kidney disease: Secondary | ICD-10-CM | POA: Diagnosis not present

## 2022-09-23 DIAGNOSIS — T8131XA Disruption of external operation (surgical) wound, not elsewhere classified, initial encounter: Secondary | ICD-10-CM | POA: Diagnosis not present

## 2022-09-23 DIAGNOSIS — I509 Heart failure, unspecified: Secondary | ICD-10-CM | POA: Insufficient documentation

## 2022-09-23 DIAGNOSIS — Z125 Encounter for screening for malignant neoplasm of prostate: Secondary | ICD-10-CM | POA: Diagnosis not present

## 2022-09-23 DIAGNOSIS — E1151 Type 2 diabetes mellitus with diabetic peripheral angiopathy without gangrene: Secondary | ICD-10-CM | POA: Insufficient documentation

## 2022-09-23 DIAGNOSIS — E11622 Type 2 diabetes mellitus with other skin ulcer: Secondary | ICD-10-CM | POA: Insufficient documentation

## 2022-09-23 DIAGNOSIS — L98499 Non-pressure chronic ulcer of skin of other sites with unspecified severity: Secondary | ICD-10-CM | POA: Insufficient documentation

## 2022-09-23 DIAGNOSIS — E7849 Other hyperlipidemia: Secondary | ICD-10-CM | POA: Diagnosis not present

## 2022-09-23 DIAGNOSIS — L2089 Other atopic dermatitis: Secondary | ICD-10-CM | POA: Diagnosis not present

## 2022-09-23 DIAGNOSIS — N186 End stage renal disease: Secondary | ICD-10-CM | POA: Diagnosis not present

## 2022-09-23 DIAGNOSIS — E1140 Type 2 diabetes mellitus with diabetic neuropathy, unspecified: Secondary | ICD-10-CM | POA: Diagnosis not present

## 2022-09-23 NOTE — Progress Notes (Signed)
WILMA, WUTHRICH ([id number]) [id number] Page 1 of 2 Visit Report for 09/23/2022 Chief Complaint Document Details Patient Name: Date of Service: [name] [name]. 09/23/2022 8:00 A M Medical Record Number: [id number] Patient Account Number: [id number] Date of Birth/Sex: Treating RN: 1965/12/11 (56 y.o. [name]) [name] Primary Care Provider: [name] Other Clinician: Referring Provider: Treating Provider/Extender: [name] in Treatment: 3 Information Obtained from: Patient Chief Complaint HBO evaluation for treatment on the left foot diabetic ulcer with osteomyelitis. Patient is currently under our care as well for his foot. 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 09/23/2022 8:00 A M Medical Record Number: [id number] Patient Account Number: [id number] Date of Birth/Sex: Treating RN: Sep 22, 1966 (56 y.o. [name] Primary Care Provider: [name] Other Clinician: Referring Provider: Treating Provider/Extender: [name] in Treatment: 3 Active Problems ICD-10 Encounter Code Description Active Date MDM Diagnosis [phone] Non-pressure chronic ulcer of other part of left lower leg with other 08/28/2022 No Yes specified severity Z89.512 Acquired absence of left leg below knee 08/28/2022 No Yes E11.51 Type 2 diabetes mellitus with diabetic peripheral angiopathy 08/28/2022 No Yes without gangrene [name], [name] ([id number]) [id number] Page 2 of 2 L03.116 Cellulitis of left lower limb 08/28/2022 No Yes Inactive Problems Resolved Problems Electronic  Signature(s) Signed: 09/23/2022 8:14:58 AM By: Worthy Keeler PA-C Entered By: Worthy Keeler on 09/23/2022 08:14:57

## 2022-09-24 DIAGNOSIS — D631 Anemia in chronic kidney disease: Secondary | ICD-10-CM | POA: Diagnosis not present

## 2022-09-24 DIAGNOSIS — I96 Gangrene, not elsewhere classified: Secondary | ICD-10-CM | POA: Diagnosis not present

## 2022-09-24 DIAGNOSIS — N2581 Secondary hyperparathyroidism of renal origin: Secondary | ICD-10-CM | POA: Diagnosis not present

## 2022-09-24 DIAGNOSIS — N186 End stage renal disease: Secondary | ICD-10-CM | POA: Diagnosis not present

## 2022-09-24 DIAGNOSIS — Z992 Dependence on renal dialysis: Secondary | ICD-10-CM | POA: Diagnosis not present

## 2022-09-24 NOTE — Progress Notes (Signed)
[place], Burley [name] ([id number]) [id number] Page 1 of 9 Visit Report for 09/23/2022 Arrival Information Details Patient Name: Date of Service: [name] [name]. 09/23/2022 8:00 A M Medical Record Number: [id number] Patient Account Number: [id number] Date of Birth/Sex: Treating RN: 07-31-1966 (56 y.o. [name]) [name] Primary Care Maytal Mijangos: [name] Other Clinician: Referring Makeba Delcastillo: Treating Tiffane Sheldon/Extender: [name] in Treatment: 3 Visit Information History Since Last Visit All ordered tests and consults were completed: No Patient Arrived: Wheel Chair Added or deleted any medications: No Arrival Time: 08:00 Any new allergies or adverse reactions: No Accompanied By: wife Had a fall or experienced change in No Transfer Assistance: None activities of daily living that may affect Patient Identification Verified: Yes risk of falls: Secondary Verification Process Completed: Yes Signs or symptoms of abuse/neglect since last visito No Patient Requires Transmission-Based Precautions: No Hospitalized since last visit: No Patient Has Alerts: No Implantable device outside of the clinic excluding No cellular tissue based products placed in the center since last visit: Has Dressing in Place as Prescribed: Yes Pain Present Now: No Electronic Signature(s) Signed: 09/24/2022 7:53:46 AM By: [name] RN Entered By: [name] on 09/23/2022 08:04:51 -------------------------------------------------------------------------------- Clinic Level of Care Assessment Details Patient Name: Date of Service: [name] [name]. 09/23/2022 8:00 A M Medical Record Number: [id number] Patient Account Number: [id number] Date of Birth/Sex: Treating RN: May 19, 1966 (56 y.o. [name]) [name] Primary Care Bernise Sylvain: [name] Other Clinician: Referring Caiya Bettes: Treating Mette Southgate/Extender: [name] in Treatment: 3 Clinic Level of  Care Assessment Items TOOL 1 Quantity Score []  - 0 Use when EandM and Procedure is performed on INITIAL visit ASSESSMENTS - Nursing Assessment / Reassessment []  - 0 General Physical Exam (combine w/ comprehensive assessment (listed just below) when performed on new pt. evals) []  - 0 Comprehensive Assessment (HX, ROS, Risk Assessments, Wounds Hx, etc.) [name] ([id number]) [phone].pdf Page 2 of 9 ASSESSMENTS - Wound and Skin Assessment / Reassessment []  - 0 Dermatologic / Skin Assessment (not related to wound area) ASSESSMENTS - Ostomy and/or Continence Assessment and Care []  - 0 Incontinence Assessment and Management []  - 0 Ostomy Care Assessment and Management (repouching, etc.) PROCESS - Coordination of Care []  - 0 Simple Patient / Family Education for ongoing care []  - 0 Complex (extensive) Patient / Family Education for ongoing care []  - 0 Staff obtains Programmer, systems, Records, T Results / Process Orders est []  - 0 Staff telephones HHA, Nursing Homes / Clarify orders / etc []  - 0 Routine Transfer to another Facility (non-emergent condition) []  - 0 Routine Hospital Admission (non-emergent condition) []  - 0 New Admissions / Biomedical engineer / Ordering NPWT Apligraf, etc. , []  - 0 Emergency Hospital Admission (emergent condition) PROCESS - Special Needs []  - 0 Pediatric / Minor Patient Management []  - 0 Isolation Patient Management []  - 0 Hearing / Language / Visual special needs []  - 0 Assessment of Community assistance (transportation, D/C planning, etc.) []  - 0 Additional assistance / Altered mentation []  - 0 Support Surface(s) Assessment (bed, cushion, seat, etc.) INTERVENTIONS - Miscellaneous []  - 0 External ear exam []  - 0 Patient Transfer (multiple staff / Civil Service fast streamer / Similar devices) []  - 0 Simple Staple / Suture removal (25 or less) []  - 0 Complex Staple / Suture removal (26 or more) []  - 0 Hypo/Hyperglycemic  Management (do not check if billed separately) []  - 0 Ankle / Brachial Index (ABI) - do not check if billed  separately Has the patient been seen at the hospital within the last three years: Yes Total Score: 0 Level Of Care: ____ Electronic Signature(s) Signed: 09/24/2022 7:53:46 AM By: [name] RN Entered By: [name] on 09/23/2022 08:24:00 -------------------------------------------------------------------------------- Encounter Discharge Information Details Patient Name: Date of Service: [name], [name] [name]. 09/23/2022 8:00 A M Medical Record Number: [id number] Patient Account Number: [id number] Date of Birth/Sex: Treating RN: 06-29-1966 (56 y.o. [name] Primary Care Kimo Bancroft: [name] Other Clinician: Referring Klara Stjames: Treating Shawnte Winton/Extender: Dorothea Glassman [place], [place] ([id number]) [phone].pdf Page 3 of 9 Weeks in Treatment: 3 Encounter Discharge Information Items Post Procedure Vitals Discharge Condition: Stable Temperature (F): 98 Ambulatory Status: Wheelchair Pulse (bpm): 69 Discharge Destination: Home Respiratory Rate (breaths/min): 18 Transportation: Private Auto Blood Pressure (mmHg): 154/91 Accompanied By: wife Schedule Follow-up Appointment: Yes Clinical Summary of Care: Electronic Signature(s) Signed: 09/24/2022 7:53:46 AM By: [name] RN Entered By: [name] on 09/23/2022 08:26:38 -------------------------------------------------------------------------------- Lower Extremity Assessment Details Patient Name: Date of Service: [name], [name] [name]. 09/23/2022 8:00 A M Medical Record Number: [id number] Patient Account Number: [id number] Date of Birth/Sex: Treating RN: June 24, 1966 (56 y.o. [name] Primary Care Fortino Haag: [name] Other Clinician: Referring Myrah Strawderman: Treating Yussuf Sawyers/Extender: [name] in Treatment: 3 Electronic Signature(s) Signed: 09/24/2022  7:53:46 AM By: [name] RN Entered By: [name] on 09/23/2022 08:13:38 -------------------------------------------------------------------------------- Multi Wound Chart Details Patient Name: Date of Service: [name], [name] [name]. 09/23/2022 8:00 A M Medical Record Number: [id number] Patient Account Number: [id number] Date of Birth/Sex: Treating RN: [birth year] (56 y.o. [name] Primary Care Joliet Mallozzi: [name] Other Clinician: Referring Ronnesha Mester: Treating Fransheska Willingham/Extender: [name] in Treatment: 3 Vital Signs Height(in): 70 Pulse(bpm): 69 Weight(lbs): 166 Blood Pressure(mmHg): 154/91 Body Mass Index(BMI): 23.8 Temperature(F): 98 Respiratory Rate(breaths/min): 18 [Geer, Alie [name] ([id number]):Photos:] [[id number].pdf Page 4 of 9:3 N/A N/A N/A N/A] Left Amputation Site - Below Knee N/A N/A Wound Location: Surgical Injury N/A N/A Wounding Event: Dehisced Wound N/A N/A Primary Etiology: Cataracts, Anemia, Sleep Apnea, N/A N/A Comorbid History: Arrhythmia, Congestive Heart Failure, Coronary Artery Disease, Hypertension, Myocardial Infarction, Type II Diabetes, Neuropathy, Seizure Disorder 07/19/2022 N/A N/A Date Acquired: 3 N/A N/A Weeks of Treatment: Open N/A N/A Wound Status: No N/A N/A Wound Recurrence: 11.2x1.5x2.6 N/A N/A Measurements [name] x W x D (cm) 13.195 N/A N/A A (cm) : rea 34.306 N/A N/A Volume (cm) : 2.60% N/A N/A % Reduction in Area: -744.10% N/A N/A % Reduction in Volume: Full Thickness Without Exposed N/A N/A Classification: Support Structures Medium N/A N/A Exudate Amount: Serosanguineous N/A N/A Exudate Type: red, brown N/A N/A Exudate Color: Medium (34-66%) N/A N/A Granulation Amount: Pink N/A N/A Granulation Quality: Medium (34-66%) N/A N/A Necrotic Amount: Fat Layer (Subcutaneous Tissue): Yes N/A N/A Exposed Structures: Fascia: No Tendon: No Muscle: No Joint: No Bone:  No None N/A N/A Epithelialization: Treatment Notes Electronic Signature(s) Signed: 09/24/2022 7:53:46 AM By: [name] RN Entered By: [name] on 09/23/2022 08:13:44 -------------------------------------------------------------------------------- Multi-Disciplinary Care Plan Details Patient Name: Date of Service: [name], [name] [name]. 09/23/2022 8:00 A M Medical Record Number: [id number] Patient Account Number: [id number] Date of Birth/Sex: Treating RN: 05-11-1966 (56 y.o. [name] Primary Care Shahed Yeoman: [name] Other Clinician: Referring Corinthian Kemler: Treating Adalynd Donahoe/Extender: [name] in Treatment: 3 Active Inactive Abuse / Safety / Falls / Self Care Management [name], [name] ([id number]) [phone].pdf Page 5 of 9 Nursing Diagnoses: Potential  for injury related to falls Goals: Patient will remain injury free related to falls Date Initiated: 08/28/2022 Target Resolution Date: 09/27/2022 Goal Status: Active Interventions: Assess Activities of Daily Living upon admission and as needed Assess fall risk on admission and as needed Assess: immobility, friction, shearing, incontinence upon admission and as needed Assess impairment of mobility on admission and as needed per policy Assess personal safety and home safety (as indicated) on admission and as needed Assess self care needs on admission and as needed Notes: Necrotic Tissue Nursing Diagnoses: Knowledge deficit related to management of necrotic/devitalized tissue Goals: Necrotic/devitalized tissue will be minimized in the wound bed Date Initiated: 08/28/2022 Target Resolution Date: 10/17/2022 Goal Status: Active Interventions: Assess patient pain level pre-, during and post procedure and prior to discharge Notes: Nutrition Nursing Diagnoses: Impaired glucose control: actual or potential Goals: Patient/caregiver will maintain therapeutic glucose control Date  Initiated: 08/28/2022 Target Resolution Date: 10/17/2022 Goal Status: Active Interventions: Assess HgA1c results as ordered upon admission and as needed Assess patient nutrition upon admission and as needed per policy Notes: Wound/Skin Impairment Nursing Diagnoses: Knowledge deficit related to ulceration/compromised skin integrity Goals: Patient/caregiver will verbalize understanding of skin care regimen Date Initiated: 08/28/2022 Target Resolution Date: 10/17/2022 Goal Status: Active Ulcer/skin breakdown will have a volume reduction of 30% by week 4 Date Initiated: 08/28/2022 Target Resolution Date: 10/17/2022 Goal Status: Active Ulcer/skin breakdown will have a volume reduction of 50% by week 8 Date Initiated: 08/28/2022 Target Resolution Date: 11/17/2022 Goal Status: Active Ulcer/skin breakdown will have a volume reduction of 80% by week 12 Date Initiated: 08/28/2022 Target Resolution Date: 12/18/2022 Goal Status: Active Ulcer/skin breakdown will heal within 14 weeks Date Initiated: 08/28/2022 Target Resolution Date: 01/16/2023 Goal Status: Active Interventions: Assess patient/caregiver ability to obtain necessary supplies Assess patient/caregiver ability to perform ulcer/skin care regimen upon admission and as needed Assess ulceration(s) every visit Notes: [name], [name] ([id number]) [id number] Page 6 of 9 Electronic Signature(s) Signed: 09/24/2022 7:53:46 AM By: [name] RN Entered By: [name] on 09/23/2022 08:24:23 -------------------------------------------------------------------------------- Pain Assessment Details Patient Name: Date of Service: [name], [name] [name]. 09/23/2022 8:00 A M Medical Record Number: [id number] Patient Account Number: [id number] Date of Birth/Sex: Treating RN: 1966/02/09 (56 y.o. [name]) [name] Primary Care Alexismarie Flaim: [name] Other Clinician: Referring Hanifa Antonetti: Treating Earmon Sherrow/Extender: [name] in Treatment: 3 Active Problems Location of Pain Severity and Description of Pain Patient Has Paino No Site Locations Pain Management and Medication Current Pain Management: Electronic Signature(s) Signed: 09/24/2022 7:53:46 AM By: [name] RN Entered By: [name] on 09/23/2022 08:05:20 Patient/Caregiver Education Details -------------------------------------------------------------------------------- [name] ([id number]) [id number] Page 7 of 9 Patient Name: Date of Service: [name] [name]. 12/5/2023andnbsp8:00 A M Medical Record Number: [id number] Patient Account Number: [id number] Date of Birth/Gender: Treating RN: May 13, 1966 (56 y.o. [name]) [name] Primary Care Physician: [name] Other Clinician: Referring Physician: Treating Physician/Extender: [name] in Treatment: 3 Education Assessment Education Provided To: Patient Education Topics Provided Wound/Skin Impairment: Methods: Explain/Verbal Responses: State content correctly Electronic Signature(s) Signed: 09/24/2022 7:53:46 AM By: [name] RN Entered By: [name] on 09/23/2022 08:24:14 -------------------------------------------------------------------------------- Wound Assessment Details Patient Name: Date of Service: [name], [name] [name]. 09/23/2022 8:00 A M Medical Record Number: [id number] Patient Account Number: [id number] Date of Birth/Sex: Treating RN: [birth year] (56 y.o. [name] Primary Care Nayvie Lips: [name] Other Clinician: Referring Maxson Oddo: Treating Makayli Bracken/Extender: [name] in Treatment: 3 Wound  Status Wound Number: 3 Primary Dehisced Wound Etiology: Wound Location: Left Amputation Site - Below Knee Wound Open Wounding Event: Surgical Injury Status: Date Acquired: 07/19/2022 Comorbid Cataracts, Anemia, Sleep Apnea, Arrhythmia, Congestive Heart Weeks Of  Treatment: 3 History: Failure, Coronary Artery Disease, Hypertension, Myocardial Clustered Wound: No Infarction, Type II Diabetes, Neuropathy, Seizure Disorder Photos Wound Measurements Length: (cm) 11.2 Width: (cm) 1.5 Depth: (cm) 2.6 Area: (cm) 13.195 [name] ([id number]) Volume: (cm) 34.306 % Reduction in Area: 2.6% % Reduction in Volume: -744.1% Epithelialization: None Tunneling: No [id number] Page 8 of 9 Undermining: No Wound Description Classification: Full Thickness Without Exposed Suppor Exudate Amount: Medium Exudate Type: Serosanguineous Exudate Color: red, brown t Structures Foul Odor After Cleansing: No Slough/Fibrino Yes Wound Bed Granulation Amount: Medium (34-66%) Exposed Structure Granulation Quality: Pink Fascia Exposed: No Necrotic Amount: Medium (34-66%) Fat Layer (Subcutaneous Tissue) Exposed: Yes Necrotic Quality: Adherent Slough Tendon Exposed: No Muscle Exposed: No Joint Exposed: No Bone Exposed: No Treatment Notes Wound #3 (Amputation Site - Below Knee) Wound Laterality: Left Cleanser Peri-Wound Care Topical Santyl Collagenase Ointment, 30 (gm), tube Discharge Instruction: apply nickel thick to wound bed only Primary Dressing Gauze Discharge Instruction: moistened with normal saline over santyl Hydrofera Blue Classic Foam Rope Dressing, 9x6 (mm/in) Discharge Instruction: cut to 1/4 , pointed tip, insert into lateral aspect of wound , change every other day Secondary Dressing ABD Pad 5x9 (in/in) Discharge Instruction: Cover with ABD pad Secured With Medipore T - 85M Medipore H Soft Cloth Surgical T ape ape, 2x2 (in/yd) Tubigrip Size E, 3.5x10 (in/yds) Discharge Instruction: Apply 3 Tubigrip E 3-finger-widths below knee to base of toes to secure dressing and/or for swelling. Compression Wrap Compression Stockings Add-Ons Electronic Signature(s) Signed: 09/24/2022 7:53:46 AM By: [name] RN Entered  By: [name] on 09/23/2022 08:13:26 -------------------------------------------------------------------------------- Vitals Details Patient Name: Date of Service: [name], [name] [name]. 09/23/2022 8:00 A M Medical Record Number: [id number] Patient Account Number: [id number] Date of Birth/Sex: Treating RN: 03-24-1966 (56 y.o. [name] Primary Care Jaynia Fendley: [name] Other Clinician: Referring Mccartney Brucks: Treating Estefanny Moler/Extender: [name] in Treatment: [place], [place] ([id number]) [phone].pdf Page 9 of 9 Vital Signs Time Taken: 08:04 Temperature (F): 98 Height (in): 70 Pulse (bpm): 69 Weight (lbs): 166 Respiratory Rate (breaths/min): 18 Body Mass Index (BMI): 23.8 Blood Pressure (mmHg): 154/91 Reference Range: 80 - 120 mg / dl Electronic Signature(s) Signed: 09/24/2022 7:53:46 AM By: [name] RN Entered By: [name] on 09/23/2022 08:05:08

## 2022-09-26 DIAGNOSIS — N186 End stage renal disease: Secondary | ICD-10-CM | POA: Diagnosis not present

## 2022-09-26 DIAGNOSIS — I96 Gangrene, not elsewhere classified: Secondary | ICD-10-CM | POA: Diagnosis not present

## 2022-09-26 DIAGNOSIS — N2581 Secondary hyperparathyroidism of renal origin: Secondary | ICD-10-CM | POA: Diagnosis not present

## 2022-09-26 DIAGNOSIS — Z992 Dependence on renal dialysis: Secondary | ICD-10-CM | POA: Diagnosis not present

## 2022-09-26 DIAGNOSIS — D631 Anemia in chronic kidney disease: Secondary | ICD-10-CM | POA: Diagnosis not present

## 2022-09-29 DIAGNOSIS — I959 Hypotension, unspecified: Secondary | ICD-10-CM | POA: Diagnosis not present

## 2022-09-29 DIAGNOSIS — Z5181 Encounter for therapeutic drug level monitoring: Secondary | ICD-10-CM | POA: Diagnosis not present

## 2022-09-29 DIAGNOSIS — N186 End stage renal disease: Secondary | ICD-10-CM | POA: Diagnosis not present

## 2022-09-29 DIAGNOSIS — I132 Hypertensive heart and chronic kidney disease with heart failure and with stage 5 chronic kidney disease, or end stage renal disease: Secondary | ICD-10-CM | POA: Diagnosis not present

## 2022-09-29 DIAGNOSIS — T8781 Dehiscence of amputation stump: Secondary | ICD-10-CM | POA: Diagnosis not present

## 2022-09-29 DIAGNOSIS — D631 Anemia in chronic kidney disease: Secondary | ICD-10-CM | POA: Diagnosis not present

## 2022-09-29 DIAGNOSIS — E1122 Type 2 diabetes mellitus with diabetic chronic kidney disease: Secondary | ICD-10-CM | POA: Diagnosis not present

## 2022-09-29 DIAGNOSIS — N2581 Secondary hyperparathyroidism of renal origin: Secondary | ICD-10-CM | POA: Diagnosis not present

## 2022-09-29 DIAGNOSIS — L02612 Cutaneous abscess of left foot: Secondary | ICD-10-CM | POA: Diagnosis not present

## 2022-09-29 DIAGNOSIS — I5020 Unspecified systolic (congestive) heart failure: Secondary | ICD-10-CM | POA: Diagnosis not present

## 2022-09-29 DIAGNOSIS — N419 Inflammatory disease of prostate, unspecified: Secondary | ICD-10-CM | POA: Insufficient documentation

## 2022-09-29 DIAGNOSIS — I96 Gangrene, not elsewhere classified: Secondary | ICD-10-CM | POA: Diagnosis not present

## 2022-09-29 DIAGNOSIS — Z992 Dependence on renal dialysis: Secondary | ICD-10-CM | POA: Diagnosis not present

## 2022-09-30 ENCOUNTER — Encounter: Payer: Medicare Other | Admitting: Physician Assistant

## 2022-09-30 DIAGNOSIS — E1140 Type 2 diabetes mellitus with diabetic neuropathy, unspecified: Secondary | ICD-10-CM | POA: Diagnosis not present

## 2022-09-30 DIAGNOSIS — I110 Hypertensive heart disease with heart failure: Secondary | ICD-10-CM | POA: Diagnosis not present

## 2022-09-30 DIAGNOSIS — E1151 Type 2 diabetes mellitus with diabetic peripheral angiopathy without gangrene: Secondary | ICD-10-CM | POA: Diagnosis not present

## 2022-09-30 DIAGNOSIS — L97828 Non-pressure chronic ulcer of other part of left lower leg with other specified severity: Secondary | ICD-10-CM | POA: Diagnosis not present

## 2022-09-30 DIAGNOSIS — E11622 Type 2 diabetes mellitus with other skin ulcer: Secondary | ICD-10-CM | POA: Diagnosis not present

## 2022-09-30 DIAGNOSIS — L98499 Non-pressure chronic ulcer of skin of other sites with unspecified severity: Secondary | ICD-10-CM | POA: Diagnosis not present

## 2022-09-30 DIAGNOSIS — T8131XA Disruption of external operation (surgical) wound, not elsewhere classified, initial encounter: Secondary | ICD-10-CM | POA: Diagnosis not present

## 2022-09-30 NOTE — Progress Notes (Signed)
FRANS, VALENTE ([id number]) [id number] Page 1 of 10 Visit Report for 09/30/2022 Chief Complaint Document Details Patient Name: Date of Service: [name] [name]. 09/30/2022 1:15 PM Medical Record Number: [id number] Patient Account Number: [id number] Date of Birth/Sex: Treating [name]: [birth year] (56 y.o. 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 09/30/2022 1:15 PM Medical Record Number: [id number] Patient Account Number: [id number] Date of Birth/Sex: Treating [name]: [birth year] (56 y.o. [name]) [name] Primary Care Provider: Nolene [name] Other Clinician: Referring Provider: Treating Provider/Extender: [name]: 4 Debridement Performed for Assessment: Wound #3 Left Amputation Site - Below Knee Performed By: Physician [name] Sams., PA-C Debridement Type: Debridement Level of Consciousness (Pre-procedure): Awake and Alert Pre-procedure Verification/Time Out Yes - 13:40 Taken: Start Time: 13:40 T Area Debrided ([name] x W): otal 2 (cm) x 1.2 (cm) = 2.4 (cm) Tissue and other material debrided: Viable, Non-Viable, Slough, Subcutaneous, Skin: Dermis , Skin: Epidermis, Slough Level: Skin/Subcutaneous Tissue Debridement Description: Excisional Instrument: Curette Bleeding: Moderate Hemostasis Achieved: Silver Nitrate End Time: 13:48 Procedural Pain: 0 Post Procedural Pain: 0 Response to [name]: Procedure  was tolerated well Level of Consciousness [name] ([id number]) [phone].pdf Page 2 of 10 Level of Consciousness (Post- Awake and Alert procedure): Post Debridement Measurements of Total Wound Length: (cm) 11 Width: (cm) 1.2 Depth: (cm) 1.5 Volume: (cm) 15.551 Character of Wound/Ulcer Post Debridement: Improved Post Procedure Diagnosis Same as Pre-procedure Electronic Signature(s) Signed: 09/30/2022 4:34:43 PM By: [name] Signed: 09/30/2022 5:45:58 PM By: [name] Keeler PA-C Entered By: [name] on 09/30/2022 13:43:11 -------------------------------------------------------------------------------- HPI Details Patient Name: Date of Service: [name], [name] [name]. 09/30/2022 1:15 PM Medical Record Number: [id number] Patient Account Number: [id number] Date of Birth/Sex: Treating [name]: 11/29/1965 (56 y.o. [name] Primary Care Provider: Nolene [name] Other Clinician: Referring Provider: Treating Provider/Extender: [name]: 4 History of Present Illness HPI Description: 10/31/2021 patient presents today for evaluation here in our clinic. He is actually being evaluated for hyperbaric oxygen therapy only. He has been seen at [name] up to this point he is under the care of the vascular surgery specialty. Subsequently he is also under the care of the hyperbaric center there. With that being said that due to the fact that he is a dialysis patient he was actually recommended to be transferred to [place] for his treatments he actually lives in [name] which is where his dialysis is. It was a hardship for him to try to get from [name] especially on dialysis days all the way to [name] get into the facility for his [name] and get back home he was literally spending all day long. He has had just 4 treatments there at [name] thus far based on what I see in these have not been continued while he  awaits approval here in our clinic. Subsequently I do have records for review that will be included in the HPI predetermination review and attached to this chart as well. I will not duplicate that here. 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 Nonetheless he continues to have exposed bone with marked soft tissue swelling compatible with osteomyelitis. This is due to the irregularity of the head of the metatarsal as well. Nonetheless along with having associated cellulitis he is also good to be seen by infectious disease and is currently on antibiotics for this as well. Again that will be detailed in the pretreatment review section which I will attach to this note as well. He does currently have a wound VAC in place and again the wound was not evaluated here in the clinic as that is being completely managed by home health and [facility]. The patient does have a history of diabetes mellitus type 2, hypertension, coronary artery disease, and congestive heart failure. He also has cataracts of both eyes but no evidence of glaucoma on his most recent eye exam. 11/22/2021 we have been seeing this gentleman for hyperbaric oxygen therapy but have not actually evaluated his wound up to this point this was being managed by the wound care and vascular team I presumed at [name] at least that is what has been told to be previous. Nonetheless at this point I got a call from [name] who is a Librarian, academic at the [name] vascular clinic with Dr. Durene [name] and subsequently they were wanting to know if I could take over wound care for this patient. Apparently they do not have the ability for an office debridement which again I completely understand but nonetheless definitely is not an integral part of his healing along with the hyperbarics. Subsequently I  discussed with the patient today that I do believe he would benefit going ahead and proceeding with evaluation here in the clinic for that reason I did go ahead and see him today to get things started. There is also been some confusion about the issues with his home health nurse and getting measurements to [facility] for the wound VAC in order to continue his wound VAC therapy. With that being said after I see him today we will make a determination on what to do with wound VAC we can definitely send measurements to [name] but again the bigger question is whether this is the best way to go or not. 11/28/2021 upon evaluation today patient appears to be doing decently well in regard to his wound. He has been tolerating the dressing changes with the Dakin's moistened gauze dressing which I think is doing a good job. Fortunately I do not see any signs of active infection locally nor systemically at this point. In [name] I think that he is actually making excellent progress which is great news. 2/17; seen in conjunction with HBO today. 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 Much improved condition of the wound surface. On the lateral foot and eschared area that he has been using Betadine. T olerating HBO well 12/13/2021 upon evaluation today patient appears to be doing pretty well in regard to his foot ulcer. I am actually much more pleased currently that I been with the way the appearance looks today. The wound bed does show some signs of slough and biofilm on the surface of the wound this is minimal I [name] perform debridement the I will do so very carefully due to the fact that to be honest the patient had a significant amount of bleeding in the past being  on the [place] he had a difficult time clotting when I debrided him previously quite significantly. Fortunately I do not see any signs of active infection at this time which is great news. No fevers, chills, nausea, vomiting,  or diarrhea. BARDIA, WANGERIN ([id number]) [id number] Page 3 of 10 12/20/2021 upon evaluation today patient actually appears to be showing some signs of excellent improvement here. I am very pleased with where things stand currently. There does not appear to be any evidence of active infection locally nor systemically which is great news and I do believe that with the hyperbaric oxygen therapy he is actually making excellent progress. 3/9; patient presents for follow-up. He continues to do HBO therapy without issues. He has completed 34 out of 40 sessions. He continues to use Dakin's wet- to-dry to the amputation site and Betadine to the lateral foot wound. he currently denies signs of infection. 01/03/2022 upon evaluation today patient appears to be doing better in regard to his distal foot ulcer. Unfortunately the lateral foot is a different story this area has softened up and is getting need to be debrided away today. I Georgina Peer do this with scissors and forceps and was able to clear that out that will be detailed below. Otherwise I feel like the Dakin's moistened gauze dressing is doing an awesome job for him and he is making significantly improvements here. 01/10/2022 upon evaluation today patient continues to have some of the necrotic tissue loosen up on the lateral portion of his foot the main wound that we have been treating is actually doing quite well. Overall very pleased with where things stand in [name]. No fevers, chills, nausea, vomiting, or diarrhea. 01/17/2022 upon evaluation today patient appears to be doing somewhat poorly in regard to the lateral foot wound. The main wound that we initially were seeing him for looks to be doing great. This lateral foot has worsened and unfortunately it appears to potentially have some signs of infection here. I do believe that we may need to see about getting started on some IV antibiotics he is in dialysis so they could  potentially do this for him, Monday. Nonetheless I want to try to get that started ASAP. Again I would prefer him be initiated with IV vancomycin and cefepime just as an empiric broad-spectrum until we can get the culture results back at least. Subsequently also think that this should be done sooner rather than later due to the fact that his leg is more swollen and overall this seems to have progressed fairly rapidly since last week I do not want this to get worse. He voiced understanding. 01-24-2022 upon evaluation today patient appears to be doing well with regard to his main ulcer that we have been seeing him for. I am very pleased with where things stand currently. He still continues to have bleeding if we do any type of aggressive sharp debridement there for him having to be very careful using Dakin's solution along with debridement as I can here in the office. Overall the patient seems to be doing well. Unfortunately the lateral foot wound still has progressed a little bit further. We did get him on antibiotics starting this past Monday I think the drainage looks much better and overall I am seeing improvement but again this was already broken down to a certain degree I am hopeful by next week this will really start turnaround for [place]. We will also get a send him for an x-ray to evaluate for any signs of  osteomyelitis on this lateral foot. 01-31-2022 upon evaluation today patient's wound on the medial left amputation site is actually doing excellent. I am very pleased with where things stand this is significantly smaller and we are headed in the right direction. In regard to the left lateral foot there is still some necrotic tissue to be cleared away although I see evidence of this starting to basically solidify as far as the edges are concerned with how far it spread I do not see anything seeming to want to spread further. I believe this may be close to done as far as any evolving is  concerned. 02-07-2022 upon evaluation today patient appears to be doing well with regard to the main amputation site. Unfortunately the secondary site on the left lateral foot is still giving him some trouble here. There is a lot of dried tissue and I do believe that along with the Dakin's we may want to use a contact layer in the base of the wound to try to prevent the bone from drying out because we are down to that point at this time. The patient voiced understanding at this point as well and is happy to do what ever it takes to try to get this better. With that being said I do believe that it is possible he may benefit from a wound VAC. Again the question is the approval process for getting this done obviously we can see what we can do if we get things cleaned a little bit better but right now I think the best option is going to be for [place] to continue with the packing currently. 02-14-2022 upon evaluation today patient presents for follow-up concerning ongoing issues with his foot. He did have the MRI which does show that he has evidence of osteomyelitis of the digit at the fifth location of his foot. Subsequently this does extend down to the base of the digit and into the proximal portion of the metatarsal as well. With that being said the bone does not appear to be too severely broken down and a lot of the marrow signal is stated to be maintained which is good news. Nonetheless I do believe that he is unfortunately still in a spot where this is precarious and we need to make sure that we are treating this appropriately. He does seem to be doing well with IV antibiotics he also seems to be doing quite well when it comes to the overall appearance of the wound bed at this point. I am happy in that regard. 02-21-2022 upon evaluation today patient appears to be doing well all things considered in regard to his wounds. I do feel like that he is making some progress here. The wound on the right distal foot  where the amputation site is is actually healing nicely I feel like we can switch over to collagen at this point that should do well for him. In regard to the left lateral foot this is actually getting much cleaner he still has some area where there seems to be some pressure getting to the location I think that he would do well with a knee scooter at this point. I discussed that with him he does not seem prone to going that route at this point he understands it can definitely help. 02-28-2022 upon evaluation today patient appears to be doing well currently with regard to the wound on his foot. The main surgical wound is actually significantly improved and the lateral wound is doing better there  is still bone exposed but nonetheless this does not appear to be significantly worse than what we have noted previous. In fact I think it looks better than it did last week. My goal is to keep the bone from drying out and subsequently for that reason we are going to go ahead and proceed with the EpiFix which I think is good to be a good option here for him. Over top of the EpiFix we will do a contact layer in order to hopefully keep things from drying out. This dressing will actually stay in place until he comes back. Patient did see vascular yesterday and states that they did not feel like anything was doing worse still it appears that if it were left up to them they think the ideal thing would be to proceed with an amputation. I explained to the patient that to be honest is not always avoidable but if we can try to avoid amputation that is what we are after. He voiced understanding and he does want to do what ever he can to try to prevent amputation for that reason we are proceeding with the epi cord today. This will be application #1. [id number] upon evaluation today patient appears to be making some progress here regard to the lateral foot. Fortunately there does not appear to be any signs of active infection  locally or systemically which is great news. No fevers, chills, nausea, vomiting, or diarrhea. I do think that he is appropriate for second application of EpiCord today. 03-14-2022 upon evaluation today patient's wound still has significant exposure of the bone at this point. He did get crutches he has been trying to use those the past couple of days am not sure that is going so well to be perfectly honest. We did discuss however offloading is important and we even discussed a total contact cast but to be perfectly honest right now and feels like he may need more debridement to clear away some of the bone which is not viable in the end of the foot of the fifth metatarsal but I am unable to really do that here in the office due to his bleeding. I do not know if potentially we could have him seen by podiatry potentially Dr. Merril Abbe to see if there is anything he can do to help in this regard. I am not even certain whether the majority of the metatarsal is even viable to be honest. Either way I do think that he would benefit from surgical debridement or even possibly partial ray amputation in order for [place] to try to keep things moving in the right direction and get this closed. This was all discussed with the patient today. With that being said I know he wants to avoid a below-knee amputation which is what has previously been recommended. 03-21-2022 upon evaluation today patient appears to be doing well in regard to the medial wound at this is shown signs of excellent improvement. In regard to the lateral wound this is a bit dry compared to what I want to see him Regino Schultze not draining as much but I think that we need to see what we can do about trying to keep this from drying out and keeping it a bit more moist. Patient voiced understanding. Overall I am very pleased otherwise with where we stand. 03-27-2022 upon evaluation today patient appears to be doing well currently in regard to his wound especially on  the medial aspect this looks really good the lateral aspect  is looking a little bit better to with some healthier appearance to the bone compared to last week to be honest. Fortunately I do not see any signs of active infection locally or systemically at this time which is great news. No fevers, chills, nausea, vomiting, or diarrhea. READMISSION 08/28/2022 This is a 56 year old man who is a diabetic on dialysis but not currently on any [name] for it. He has a known history of PAD. He was in this clinic for a protracted period of time this year for a left foot wound with underlying osteomyelitis for which she was treated with hyperbaric oxygen. As I understand things he left the clinic in June with wounds and an unhealed state possibly infected. 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 Unfortunately in September he required a revision of the BKA but he was ultimately admitted to [facility] for a protracted period with bacteremia secondary to group A strep from 9/12-10/12. Marland Kitchen He is recently completed vancomycin he was taking at dialysis [place] [employer] [name] ([id number]) [phone].pdf Page 4 of 10 He has been left with a nonhealing surgical wound on his left BKA amputation site. He recently followed with vascular surgery who recommended Santyl and Vashe solution which they have been applying to the wound every day. It is noted that he does have severe PAD in the right lower extremity that is status post transmetatarsal amputation he is going to follow him up in 3 months. He is also seen infectious disease at [facility] who state that he had MRSA treated with cefazolin and vancomycin at hemodialysis. However he is listed as having an allergic reaction to cefazolin including lip swelling therefore he completed vancomycin. He is not felt to require any additional antibiotics. 09-04-2022 upon evaluation today patient appears to be  doing well currently in regard to his wound on the medial aspect of this is amputation site where he had a BKA. On the lateral aspect however he has an area of the incision line that is open that actually goes somewhat deep. We did probe about 4 cm down although I do not feel any bone directly. Fortunately there does not appear to be any signs of active infection locally or systemically at this time. There is some concern here in my estimation about the possibility of infection and I think that he may need some extension of the antibiotics I Georgina Peer probably put him on linezolid as he was previously on vancomycin. 09-16-2022 upon evaluation today patient appears to be doing well currently in regard to his wound. There is good to be some need for sharp debridement today but fortunately nothing too significant. Fortunately I do not see any signs of active infection locally nor systemically at this time which is great news. No fevers, chills, nausea, vomiting, or diarrhea. 09-23-2022 upon evaluation today patient appears to be doing well currently in regard to his wounds that were still in the cleaning up phase in regard to the primary wound where he dehisced. There is a lot of necrotic tissue that is can have to be cleaned out working on that at this point. 09-30-2022 upon evaluation today patient appears to be doing well currently in regard to his wounds. He is slowly making progress and getting the main open area on the medial aspect of his incision line in a much healthier state. In [name] I feel like that we are headed in the  right direction. I do not see any signs of active infection at this time. Electronic Signature(s) Signed: 09/30/2022 3:24:36 PM By: [name] Keeler PA-C Entered By: [name] on 09/30/2022 15:24:36 -------------------------------------------------------------------------------- Physical Exam Details Patient Name: Date of Service: [name] [name]. 09/30/2022 1:15  PM Medical Record Number: [id number] Patient Account Number: [id number] Date of Birth/Sex: Treating [name]: Jul 28, 1966 (56 y.o. [name] Primary Care Provider: Nolene [name] Other Clinician: Referring Provider: Treating Provider/Extender: [name]: 4 Constitutional Well-nourished and well-hydrated in no acute distress. Respiratory normal breathing without difficulty. Psychiatric this patient is able to make decisions and demonstrates good insight into disease process. Alert and Oriented x 3. pleasant and cooperative. Notes Upon inspection patient's wound bed actually showed signs of good granulation and epithelization at this point. Fortunately I do not see any signs of active infection locally or systemically which is great news and overall I am extremely pleased with where we stand today. Electronic Signature(s) Signed: 09/30/2022 3:24:57 PM By: [name] Keeler PA-C Entered By: [name] on 09/30/2022 [address], [name] ([id number]) [id number] Page 5 of 10 -------------------------------------------------------------------------------- Physician Orders Details Patient Name: Date of Service: [name] [name]. 09/30/2022 1:15 PM Medical Record Number: [id number] Patient Account Number: [id number] Date of Birth/Sex: Treating [name]: [birth year] (56 y.o. [name]) [name] Primary Care Provider: Nolene [name] Other Clinician: Referring Provider: Treating Provider/Extender: [name]: 4 Verbal / Phone Orders: No Diagnosis Coding ICD-10 Coding Code Description [phone] Non-pressure chronic ulcer of other part of left lower leg with other specified severity Z89.512 Acquired absence of left leg below knee E11.51 Type 2 diabetes mellitus with diabetic peripheral angiopathy without gangrene L03.116 Cellulitis of left lower limb Follow-up Appointments Return Appointment in 1  week. [place] for wound care. May utilize formulary equivalent dressing for wound [name] orders unless otherwise specified. Home Health Nurse may visit PRN to address patients wound care needs. Jackquline [place] [phone] Bathing/ Shower/ Hygiene May shower; gently cleanse wound with antibacterial soap, rinse and pat dry prior to dressing wounds Edema Control - Lymphedema / Segmental Compressive Device / Other Tubigrip double layer applied - to stump and above knee Wound [name] Wound #3 - Amputation Site - Below Knee Wound Laterality: Left Topical: Santyl Collagenase Ointment, 30 (gm), tube 1 x Per Day/30 Days Discharge Instructions: apply nickel thick to wound bed only Prim Dressing: Gauze 1 x Per Day/30 Days ary Discharge Instructions: moistened with normal saline over santyl Prim Dressing: Hydrofera Blue Classic Foam Rope Dressing, 9x6 (mm/in) 1 x Per Day/30 Days ary Discharge Instructions: cut to 1/4 , pointed tip, insert into lateral aspect of wound , change every other day Secondary Dressing: ABD Pad 5x9 (in/in) 1 x Per Day/30 Days Discharge Instructions: Cover with ABD pad Secured With: Medipore T - 11M Medipore H Soft Cloth Surgical T ape ape, 2x2 (in/yd) 1 x Per Day/30 Days Secured With: Tubigrip Size E, 3.5x10 (in/yds) 1 x Per Day/30 Days Discharge Instructions: Apply 3 Tubigrip E 3-finger-widths below knee to base of toes to secure dressing and/or for swelling. Electronic Signature(s) Signed: 09/30/2022 4:34:43 PM By: [name] Signed: 09/30/2022 5:45:58 PM By: [name] Keeler PA-C Entered By: [name] on 09/30/2022 14:11:46 [place], [name] ([id number]) [id number] Page 6 of 10 -------------------------------------------------------------------------------- Problem List Details Patient Name: Date of Service: [name] [name]. 09/30/2022 1:15 PM Medical Record Number: [id number] Patient Account Number:  [id number] Date of Birth/Sex: Treating [name]: 08/16/1966 (56 y.o. [name]) [name] Primary Care Provider: Nolene [name] Other Clinician: Referring Provider: Treating Provider/Extender: [name]: 4 Active Problems ICD-10 Encounter Code Description Active Date MDM Diagnosis L97.828 Non-pressure chronic ulcer of other part of left lower leg with other specified 08/28/2022 No Yes severity Z89.512 Acquired absence of left leg below knee 08/28/2022 No Yes E11.51 Type 2 diabetes mellitus with diabetic peripheral angiopathy without gangrene 08/28/2022 No Yes L03.116 Cellulitis of left lower limb 08/28/2022 No Yes Inactive Problems Resolved Problems Electronic Signature(s) Signed: 09/30/2022 1:26:10 PM By: [name] Keeler PA-C Entered By: [name] on 09/30/2022 13:26:10 -------------------------------------------------------------------------------- Progress Note Details Patient Name: Date of Service: [name], [name] [name]. 09/30/2022 1:15 PM Medical Record Number: [id number] Patient Account Number: [id number] Date of Birth/Sex: Treating [name]: Mar 31, 1966 (56 y.o. 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 He is actually being evaluated for hyperbaric oxygen therapy only. He has been seen at [facility] up to this point he is under the care of the vascular surgery specialty. Subsequently he is also under the care of the hyperbaric center there. With that being said that due to the fact that he is a dialysis patient he was  actually recommended to be transferred to [place] for his treatments he actually lives in [place] which is where his dialysis is. It was a hardship for him to try to get from [place] especially on dialysis days all the way to [facility] get into the facility for his [name] and get back home he was literally spending all day long. He has had just 4 treatments there at [facility] thus far based on what I see in these have not been continued while he awaits approval here in our clinic. Subsequently I do have records for review that will be included in the HPI predetermination review and attached to this chart as well. I will not duplicate that here. 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 Nonetheless he continues to have exposed bone with marked soft tissue swelling compatible with osteomyelitis. This is due to the irregularity of the head of the metatarsal as well. Nonetheless along with having associated cellulitis he is also good to be seen by infectious disease and is currently on antibiotics for this as well. Again that will be detailed in the pretreatment review section which I will attach to this note as well. He does currently have a wound VAC in place and again the wound was not evaluated here in the clinic as that is being completely managed by home health and [name]. The patient does have a history of diabetes mellitus type 2, hypertension, coronary artery disease, and congestive heart failure. He also has cataracts of both eyes but no evidence of glaucoma on his most recent eye exam. 11/22/2021 we have been seeing this gentleman for hyperbaric oxygen therapy but have not actually evaluated his wound up to this point this was being managed by  the wound care and vascular team I presumed at [facility] at least that is what has been told to be  previous. Nonetheless at this point I got a call from [name] who is a Librarian, academic at the [facility] vascular clinic with Dr. Durene [name] and subsequently they were wanting to know if I could take over wound care for this patient. Apparently they do not have the ability for an office debridement which again I completely understand but nonetheless definitely is not an integral part of his healing along with the hyperbarics. Subsequently I discussed with the patient today that I do believe he would benefit going ahead and proceeding with evaluation here in the clinic for that reason I did go ahead and see him today to get things started. There is also been some confusion about the issues with his home health nurse and getting measurements to [facility], [name] for the wound VAC in order to continue his wound VAC therapy. With that being said after I see him today we will make a determination on what to do with wound VAC we can definitely send measurements to [facility] but again the bigger question is whether this is the best way to go or not. 11/28/2021 upon evaluation today patient appears to be doing decently well in regard to his wound. He has been tolerating the dressing changes with the Dakin's moistened gauze dressing which I think is doing a good job. Fortunately I do not see any signs of active infection locally nor systemically at this point. In [name] I think that he is actually making excellent progress which is great news. 2/17; seen in conjunction with HBO today. 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 Much improved condition of the wound surface. On the lateral foot and eschared area that he has been using Betadine. T olerating HBO well 12/13/2021 upon evaluation today patient appears to be doing pretty well in regard to his foot ulcer. I am actually much more pleased currently that I been with the way the appearance looks today. The wound bed does show some signs of  slough and biofilm on the surface of the wound this is minimal I [name] perform debridement the I will do so very carefully due to the fact that to be honest the patient had a significant amount of bleeding in the past being on the Brilinta he had a difficult time clotting when I debrided him previously quite significantly. Fortunately I do not see any signs of active infection at this time which is great news. No fevers, chills, nausea, vomiting, or diarrhea. 12/20/2021 upon evaluation today patient actually appears to be showing some signs of excellent improvement here. I am very pleased with where things stand currently. There does not appear to be any evidence of active infection locally nor systemically which is great news and I do believe that with the hyperbaric oxygen therapy he is actually making excellent progress. 3/9; patient presents for follow-up. He continues to do HBO therapy without issues. He has completed 34 out of 40 sessions. He continues to use Dakin's wet- to-dry to the amputation site and Betadine to the lateral foot wound. he currently denies signs of infection. 01/03/2022 upon evaluation today patient appears to be doing better in regard to his distal foot ulcer. Unfortunately the lateral foot is a different story this area has softened up and is getting need to be debrided away today. I Georgina Peer do this with scissors and forceps and  was able to clear that out that will be detailed below. Otherwise I feel like the Dakin's moistened gauze dressing is doing an awesome job for him and he is making significantly improvements here. 01/10/2022 upon evaluation today patient continues to have some of the necrotic tissue loosen up on the lateral portion of his foot the main wound that we have been treating is actually doing quite well. Overall very pleased with where things stand in [name]. No fevers, chills, nausea, vomiting, or diarrhea. 01/17/2022 upon evaluation today patient appears to be  doing somewhat poorly in regard to the lateral foot wound. The main wound that we initially were seeing him for looks to be doing great. This lateral foot has worsened and unfortunately it appears to potentially have some signs of infection here. I do believe that we may need to see about getting started on some IV antibiotics he is in dialysis so they could potentially do this for him, Monday. Nonetheless I want to try to get that started ASAP. Again I would prefer him be initiated with IV vancomycin and cefepime just as an empiric broad-spectrum until we can get the culture results back at least. Subsequently also think that this should be done sooner rather than later due to the fact that his leg is more swollen and overall this seems to have progressed fairly rapidly since last week I do not want this to get worse. He voiced understanding. 01-24-2022 upon evaluation today patient appears to be doing well with regard to his main ulcer that we have been seeing him for. I am very pleased with where things stand currently. He still continues to have bleeding if we do any type of aggressive sharp debridement there for him having to be very careful using Dakin's solution along with debridement as I can here in the office. Overall the patient seems to be doing well. Unfortunately the lateral foot wound still has progressed a little bit further. We did get him on antibiotics starting this past Monday I think the drainage looks much better and overall I am seeing improvement but again this was already broken down to a certain degree I am hopeful by next week this will really start turnaround for [place]. We will also get a send him for an x-ray to evaluate for any signs of osteomyelitis on this lateral foot. 01-31-2022 upon evaluation today patient's wound on the medial left amputation site is actually doing excellent. I am very pleased with where things stand this is significantly smaller and we are headed in the  right direction. In regard to the left lateral foot there is still some necrotic tissue to be cleared away although I see evidence of this starting to basically solidify as far as the edges are concerned with how far it spread I do not see anything seeming to want to spread further. I believe this may be close to done as far as any evolving is concerned. 02-07-2022 upon evaluation today patient appears to be doing well with regard to the main amputation site. Unfortunately the secondary site on the left lateral foot is still giving him some trouble here. There is a lot of dried tissue and I do believe that along with the Dakin's we may want to use a contact layer in the base of the wound to try to prevent the bone from drying out because we are down to that point at this time. The patient voiced understanding at this point as well and is happy to  do what ever it takes to try to get this better. With that being said I do believe that it is possible he may benefit from a wound VAC. Again the question is the approval process for getting this done obviously we can see what we can do if we get things cleaned a little bit better but right now I think the best option is going to be for [place] to continue with the packing currently. 02-14-2022 upon evaluation today patient presents for follow-up concerning ongoing issues with his foot. He did have the MRI which does show that he has evidence of osteomyelitis of the digit at the fifth location of his foot. Subsequently this does extend down to the base of the digit and into the proximal portion of the metatarsal as well. With that being said the bone does not appear to be too severely broken down and a lot of the marrow signal is stated to be maintained which is good news. Nonetheless I do believe that he is unfortunately still in a spot where this is precarious and we need to make sure that we are treating this appropriately. He does seem to be doing well with IV  antibiotics he also seems to be doing quite well when it comes to the overall appearance of [place], [place] [name] ([id number]) [id number] Page 8 of 10 the wound bed at this point. I am happy in that regard. 02-21-2022 upon evaluation today patient appears to be doing well all things considered in regard to his wounds. I do feel like that he is making some progress here. The wound on the right distal foot where the amputation site is is actually healing nicely I feel like we can switch over to collagen at this point that should do well for him. In regard to the left lateral foot this is actually getting much cleaner he still has some area where there seems to be some pressure getting to the location I think that he would do well with a knee scooter at this point. I discussed that with him he does not seem prone to going that route at this point he understands it can definitely help. 02-28-2022 upon evaluation today patient appears to be doing well currently with regard to the wound on his foot. The main surgical wound is actually significantly improved and the lateral wound is doing better there is still bone exposed but nonetheless this does not appear to be significantly worse than what we have noted previous. In fact I think it looks better than it did last week. My goal is to keep the bone from drying out and subsequently for that reason we are going to go ahead and proceed with the EpiFix which I think is good to be a good option here for him. Over top of the EpiFix we will do a contact layer in order to hopefully keep things from drying out. This dressing will actually stay in place until he comes back. Patient did see vascular yesterday and states that they did not feel like anything was doing worse still it appears that if it were left up to them they think the ideal thing would be to proceed with an amputation. I explained to the patient that to be honest is not always  avoidable but if we can try to avoid amputation that is what we are after. He voiced understanding and he does want to do what ever he can to try to prevent amputation for that reason we are  proceeding with the epi cord today. This will be application #1. [id number] upon evaluation today patient appears to be making some progress here regard to the lateral foot. Fortunately there does not appear to be any signs of active infection locally or systemically which is great news. No fevers, chills, nausea, vomiting, or diarrhea. I do think that he is appropriate for second application of EpiCord today. 03-14-2022 upon evaluation today patient's wound still has significant exposure of the bone at this point. He did get crutches he has been trying to use those the past couple of days am not sure that is going so well to be perfectly honest. We did discuss however offloading is important and we even discussed a total contact cast but to be perfectly honest right now and feels like he may need more debridement to clear away some of the bone which is not viable in the end of the foot of the fifth metatarsal but I am unable to really do that here in the office due to his bleeding. I do not know if potentially we could have him seen by podiatry potentially Dr. Merril Abbe to see if there is anything he can do to help in this regard. I am not even certain whether the majority of the metatarsal is even viable to be honest. Either way I do think that he would benefit from surgical debridement or even possibly partial ray amputation in order for [place] to try to keep things moving in the right direction and get this closed. This was all discussed with the patient today. With that being said I know he wants to avoid a below-knee amputation which is what has previously been recommended. 03-21-2022 upon evaluation today patient appears to be doing well in regard to the medial wound at this is shown signs of excellent improvement.  In regard to the lateral wound this is a bit dry compared to what I want to see him Regino Schultze not draining as much but I think that we need to see what we can do about trying to keep this from drying out and keeping it a bit more moist. Patient voiced understanding. Overall I am very pleased otherwise with where we stand. 03-27-2022 upon evaluation today patient appears to be doing well currently in regard to his wound especially on the medial aspect this looks really good the lateral aspect is looking a little bit better to with some healthier appearance to the bone compared to last week to be honest. Fortunately I do not see any signs of active infection locally or systemically at this time which is great news. No fevers, chills, nausea, vomiting, or diarrhea. READMISSION 08/28/2022 This is a 56 year old man who is a diabetic on dialysis but not currently on any [name] for it. He has a known history of PAD. He was in this clinic for a protracted period of time this year for a left foot wound with underlying osteomyelitis for which she was treated with hyperbaric oxygen. As I understand things he left the clinic in June with wounds and an unhealed state possibly infected. 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 Unfortunately in September he required a revision of the BKA but he was ultimately admitted to [facility] for a protracted period with bacteremia secondary to group A strep from 9/12-10/12. Marland Kitchen He is recently completed vancomycin he was taking at dialysis He has been left  with a nonhealing surgical wound on his left BKA amputation site. He recently followed with vascular surgery who recommended Santyl and Vashe solution which they have been applying to the wound every day. It is noted that he does have severe PAD in the right lower extremity that is status post transmetatarsal amputation he is going to follow him up in 3 months. He is  also seen infectious disease at [facility] who state that he had MRSA treated with cefazolin and vancomycin at hemodialysis. However he is listed as having an allergic reaction to cefazolin including lip swelling therefore he completed vancomycin. He is not felt to require any additional antibiotics. 09-04-2022 upon evaluation today patient appears to be doing well currently in regard to his wound on the medial aspect of this is amputation site where he had a BKA. On the lateral aspect however he has an area of the incision line that is open that actually goes somewhat deep. We did probe about 4 cm down although I do not feel any bone directly. Fortunately there does not appear to be any signs of active infection locally or systemically at this time. There is some concern here in my estimation about the possibility of infection and I think that he may need some extension of the antibiotics I Georgina Peer probably put him on linezolid as he was previously on vancomycin. 09-16-2022 upon evaluation today patient appears to be doing well currently in regard to his wound. There is good to be some need for sharp debridement today but fortunately nothing too significant. Fortunately I do not see any signs of active infection locally nor systemically at this time which is great news. No fevers, chills, nausea, vomiting, or diarrhea. 09-23-2022 upon evaluation today patient appears to be doing well currently in regard to his wounds that were still in the cleaning up phase in regard to the primary wound where he dehisced. There is a lot of necrotic tissue that is can have to be cleaned out working on that at this point. 09-30-2022 upon evaluation today patient appears to be doing well currently in regard to his wounds. He is slowly making progress and getting the main open area on the medial aspect of his incision line in a much healthier state. In [name] I feel like that we are headed in the right direction. I do not see  any signs of active infection at this time. Objective Constitutional Well-nourished and well-hydrated in no acute distress. Vitals Time Taken: 1:19 PM, Height: 70 in, Weight: 166 lbs, BMI: 23.8, Temperature: 98.2 F, Pulse: 80 bpm, Respiratory Rate: 18 breaths/min, Blood Pressure: 163/102 mmHg. Respiratory normal breathing without difficulty. Psychiatric [name], [name] ([id number]) [id number] Page 9 of 10 this patient is able to make decisions and demonstrates good insight into disease process. Alert and Oriented x 3. pleasant and cooperative. [name] Notes: Upon inspection patient's wound bed actually showed signs of good granulation and epithelization at this point. Fortunately I do not see any signs of active infection locally or systemically which is great news and overall I am extremely pleased with where we stand today. Integumentary (Hair, Skin) Wound #3 status is Open. Original cause of wound was Surgical Injury. The date acquired was: 07/19/2022. The wound has been in [name] 4 weeks. 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 The wound measures 11cm length x 1.2cm width x 1.5cm depth; 10.367cm^2 area and 15.551cm^3 volume. There is Fat Layer (Subcutaneous Tissue) exposed. There is no tunneling or  undermining noted. There is a medium amount of serosanguineous drainage noted. There is medium (34-66%) pink granulation within the wound bed. There is a medium (34-66%) amount of necrotic tissue within the wound bed including Adherent Slough. 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  There was a Excisional Skin/Subcutaneous Tissue Debridement with a total area of 2.4 sq cm performed by [name] Sams., PA-C. With the following instrument(s): Curette to remove Viable and Non- Viable tissue/material. Material removed includes Subcutaneous Tissue, Slough, Skin: Dermis, and Skin: Epidermis. No specimens were taken. A time out was conducted at 13:40, prior to the start of the procedure. A Moderate amount of bleeding was controlled with Silver Nitrate. The procedure was tolerated well with a pain level of 0 throughout and a pain level of 0 following the procedure. Post Debridement Measurements: 11cm length x 1.2cm width x 1.5cm depth; 15.551cm^3 volume. Character of Wound/Ulcer Post Debridement is improved. Post procedure Diagnosis Wound #3: Same as Pre-Procedure Plan Follow-up Appointments: Return Appointment in 1 week. Home Health: [facility] for wound care. May utilize formulary equivalent dressing for wound [name] orders unless otherwise specified. Home Health Nurse may visit PRN to address patientoos wound care needs. Jackquline Denmark [phone] Bathing/ Shower/ Hygiene: May shower; gently cleanse wound with antibacterial soap, rinse and pat dry prior to dressing wounds Edema Control - Lymphedema / Segmental Compressive Device / Other: Tubigrip double layer applied - to stump and above knee WOUND #3: - Amputation Site - Below Knee Wound Laterality: Left Topical: Santyl Collagenase Ointment, 30 (gm), tube 1 x Per Day/30 Days Discharge Instructions: apply nickel thick to wound bed only Prim Dressing: Gauze 1 x Per Day/30 Days ary Discharge Instructions: moistened with normal saline over santyl Prim Dressing: Hydrofera Blue Classic Foam Rope Dressing, 9x6 (mm/in) 1 x Per Day/30 Days ary Discharge Instructions: cut to 1/4 , pointed tip, insert into lateral aspect of wound , change every other day Secondary Dressing: ABD Pad 5x9 (in/in) 1 x Per Day/30  Days Discharge Instructions: Cover with ABD pad Secured With: Medipore T - 40M Medipore H Soft Cloth Surgical T ape ape, 2x2 (in/yd) 1 x Per Day/30 Days Secured With: Tubigrip Size E, 3.5x10 (in/yds) 1 x Per Day/30 Days Discharge Instructions: Apply 3 Tubigrip E 3-finger-widths below knee to base of toes to secure dressing and/or for swelling. 1. I am going to recommend that we have the patient continue to monitor for any signs of infection or worsening. Based on what I see I do believe we are headed in the right direction. 2. I am also going to suggest that the patient should continue with the [facility] to cover which I think is doing a good job in regard to the rope packing into the lateral aspect of the wound. In regard to the primary open area were also using Hydrofera Blue at this location. 3. I am also can recommend that we continue with the Tubigrip to help with controlling the edema this is still going to be ideal and key in order to keep things moving in the right direction. We will see patient back for reevaluation in 1 week here in the clinic. If anything  worsens or changes patient will contact our office for additional recommendations. Electronic Signature(s) [name], [place] [name] ([id number]) [phone].pdf Page 10 of 10 Signed: 09/30/2022 3:25:45 PM By: [name] Keeler PA-C Entered By: [name] on 09/30/2022 15:25:45 -------------------------------------------------------------------------------- SuperBill Details Patient Name: Date of Service: [name], [name] [name]. 09/30/2022 Medical Record Number: [id number] Patient Account Number: [id number] Date of Birth/Sex: Treating [name]: [birth year] (56 y.o. 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.512 Acquired absence of left leg below knee E11.51 Type 2 diabetes mellitus with diabetic peripheral angiopathy without gangrene L03.116 Cellulitis of left lower limb Facility Procedures : CPT4 Code: [id number] Description: 71836 - DEB SUBQ TISSUE 20 SQ CM/< ICD-10 Diagnosis Description L97.828 Non-pressure chronic ulcer of other part of left lower leg with other specified Modifier: severity Quantity: 1 Physician Procedures : CPT4 Code Description Modifier [id number] 11042 - WC PHYS SUBQ TISS 20 SQ CM ICD-10 Diagnosis Description L97.828 Non-pressure chronic ulcer of other part of left lower leg with other specified severity Quantity: 1 Electronic Signature(s) Signed: 09/30/2022 3:25:57 PM By: [name] Keeler PA-C Entered By: [name] on 09/30/2022 15:25:57

## 2022-10-01 DIAGNOSIS — E039 Hypothyroidism, unspecified: Secondary | ICD-10-CM | POA: Diagnosis not present

## 2022-10-01 DIAGNOSIS — D631 Anemia in chronic kidney disease: Secondary | ICD-10-CM | POA: Diagnosis not present

## 2022-10-01 DIAGNOSIS — E782 Mixed hyperlipidemia: Secondary | ICD-10-CM | POA: Diagnosis not present

## 2022-10-01 DIAGNOSIS — Z992 Dependence on renal dialysis: Secondary | ICD-10-CM | POA: Diagnosis not present

## 2022-10-01 DIAGNOSIS — N2581 Secondary hyperparathyroidism of renal origin: Secondary | ICD-10-CM | POA: Diagnosis not present

## 2022-10-01 DIAGNOSIS — N419 Inflammatory disease of prostate, unspecified: Secondary | ICD-10-CM | POA: Diagnosis not present

## 2022-10-01 DIAGNOSIS — N186 End stage renal disease: Secondary | ICD-10-CM | POA: Diagnosis not present

## 2022-10-01 DIAGNOSIS — I96 Gangrene, not elsewhere classified: Secondary | ICD-10-CM | POA: Diagnosis not present

## 2022-10-01 NOTE — Progress Notes (Signed)
Hagerman, [name] ([id number]) [id number] Page 1 of 9 Visit Report for 09/30/2022 Arrival Information Details Patient Name: Date of Service: [name] [name]. 09/30/2022 1:15 PM Medical Record Number: [id number] Patient Account Number: [id number] Date of Birth/Sex: Treating RN: [birth year] (56 y.o. [name]) [name] Primary Care Eternity Dexter: [name] Other Clinician: Referring Mizraim Harmening: Treating Kenna Kirn/Extender: [name] in Treatment: 4 Visit Information History Since Last Visit Added or deleted any medications: No Patient Arrived: Wheel Chair Any new allergies or adverse reactions: No Arrival Time: 13:14 Had a fall or experienced change in No Accompanied By: wife activities of daily living that may affect Transfer Assistance: None risk of falls: Patient Identification Verified: Yes Signs or symptoms of abuse/neglect since last visito No Secondary Verification Process Completed: Yes Hospitalized since last visit: No Patient Requires Transmission-Based Precautions: No Implantable device outside of the clinic excluding No Patient Has Alerts: No cellular tissue based products placed in the center since last visit: Has Dressing in Place as Prescribed: Yes Has Compression in Place as Prescribed: Yes Pain Present Now: Yes Electronic Signature(s) Signed: 09/30/2022 4:34:43 PM By: [name] RN Entered By: [name] on 09/30/2022 13:19:07 -------------------------------------------------------------------------------- Clinic Level of Care Assessment Details Patient Name: Date of Service: [name] [name]. 09/30/2022 1:15 PM Medical Record Number: [id number] Patient Account Number: [id number] Date of Birth/Sex: Treating RN: [birth year] (56 y.o. [name] Primary Care Janecia Palau: [name] Other Clinician: Referring Merlina Marchena: Treating Clive Parcel/Extender: [name] in Treatment: 4 Clinic Level of Care  Assessment Items TOOL 1 Quantity Score []  - 0 Use when EandM and Procedure is performed on INITIAL visit ASSESSMENTS - Nursing Assessment / Reassessment []  - 0 General Physical Exam (combine w/ comprehensive assessment (listed just below) when performed on new pt. evals) []  - 0 Comprehensive Assessment (HX, ROS, Risk Assessments, Wounds Hx, etc.) Hillis, Archie [name] ([id number]) [phone].pdf Page 2 of 9 ASSESSMENTS - Wound and Skin Assessment / Reassessment []  - 0 Dermatologic / Skin Assessment (not related to wound area) ASSESSMENTS - Ostomy and/or Continence Assessment and Care []  - 0 Incontinence Assessment and Management []  - 0 Ostomy Care Assessment and Management (repouching, etc.) PROCESS - Coordination of Care []  - 0 Simple Patient / Family Education for ongoing care []  - 0 Complex (extensive) Patient / Family Education for ongoing care []  - 0 Staff obtains Programmer, systems, Records, T Results / Process Orders est []  - 0 Staff telephones HHA, Nursing Homes / Clarify orders / etc []  - 0 Routine Transfer to another Facility (non-emergent condition) []  - 0 Routine Hospital Admission (non-emergent condition) []  - 0 New Admissions / Biomedical engineer / Ordering NPWT Apligraf, etc. , []  - 0 Emergency Hospital Admission (emergent condition) PROCESS - Special Needs []  - 0 Pediatric / Minor Patient Management []  - 0 Isolation Patient Management []  - 0 Hearing / Language / Visual special needs []  - 0 Assessment of Community assistance (transportation, D/C planning, etc.) []  - 0 Additional assistance / Altered mentation []  - 0 Support Surface(s) Assessment (bed, cushion, seat, etc.) INTERVENTIONS - Miscellaneous []  - 0 External ear exam []  - 0 Patient Transfer (multiple staff / Civil Service fast streamer / Similar devices) []  - 0 Simple Staple / Suture removal (25 or less) []  - 0 Complex Staple / Suture removal (26 or more) []  - 0 Hypo/Hyperglycemic  Management (do not check if billed separately) []  - 0 Ankle / Brachial Index (ABI) - do not check if billed separately Has the  patient been seen at the hospital within the last three years: Yes Total Score: 0 Level Of Care: ____ Electronic Signature(s) Signed: 09/30/2022 4:34:43 PM By: [name] RN Entered By: [name] on 09/30/2022 14:16:19 -------------------------------------------------------------------------------- Encounter Discharge Information Details Patient Name: Date of Service: [name], [name] [name]. 09/30/2022 1:15 PM Medical Record Number: [id number] Patient Account Number: [id number] Date of Birth/Sex: Treating RN: 08-18-1966 (56 y.o. [name] Primary Care Pieper Kasik: [name] Other Clinician: Referring Suzannah Bettes: Treating Enis Leatherwood/Extender: Dorothea Glassman [place], [place] ([id number]) [phone].pdf Page 3 of 9 Weeks in Treatment: 4 Encounter Discharge Information Items Post Procedure Vitals Discharge Condition: Stable Temperature (F): 98.2 Ambulatory Status: Wheelchair Pulse (bpm): 80 Discharge Destination: Home Respiratory Rate (breaths/min): 18 Transportation: Private Auto Blood Pressure (mmHg): 163/102 Accompanied By: wife Schedule Follow-up Appointment: Yes Clinical Summary of Care: Electronic Signature(s) Signed: 09/30/2022 2:18:03 PM By: [name] RN Entered By: [name] on 09/30/2022 14:18:03 -------------------------------------------------------------------------------- Lower Extremity Assessment Details Patient Name: Date of Service: [name] [name]. 09/30/2022 1:15 PM Medical Record Number: [id number] Patient Account Number: [id number] Date of Birth/Sex: Treating RN: 06/25/1966 (56 y.o. [name] Primary Care Jacqualyn Sedgwick: [name] Other Clinician: Referring Maxwell Martorano: Treating Beila Purdie/Extender: [name] in Treatment: 4 Electronic Signature(s) Signed: 09/30/2022  4:34:43 PM By: [name] RN Entered By: [name] on 09/30/2022 13:34:18 -------------------------------------------------------------------------------- Multi Wound Chart Details Patient Name: Date of Service: [name], [name] [name]. 09/30/2022 1:15 PM Medical Record Number: [id number] Patient Account Number: [id number] Date of Birth/Sex: Treating RN: 05-26-1966 (56 y.o. [name] Primary Care Fletcher Ostermiller: [name] Other Clinician: Referring Katrin Grabel: Treating Elmira Olkowski/Extender: [name] in Treatment: 4 Vital Signs Height(in): 70 Pulse(bpm): 80 Weight(lbs): 166 Blood Pressure(mmHg): 163/102 Body Mass Index(BMI): 23.8 Temperature(F): 98.2 Respiratory Rate(breaths/min): 18 [Hamler, Elyas [name] ([id number]):Photos:] [[id number].pdf Page 4 of 9:3 N/A N/A N/A N/A] Left Amputation Site - Below Knee N/A N/A Wound Location: Surgical Injury N/A N/A Wounding Event: Dehisced Wound N/A N/A Primary Etiology: Cataracts, Anemia, Sleep Apnea, N/A N/A Comorbid History: Arrhythmia, Congestive Heart Failure, Coronary Artery Disease, Hypertension, Myocardial Infarction, Type II Diabetes, Neuropathy, Seizure Disorder 07/19/2022 N/A N/A Date Acquired: 4 N/A N/A Weeks of Treatment: Open N/A N/A Wound Status: No N/A N/A Wound Recurrence: 11x1.2x1.5 N/A N/A Measurements [name] x W x D (cm) 10.367 N/A N/A A (cm) : rea 15.551 N/A N/A Volume (cm) : 23.50% N/A N/A % Reduction in Area: -282.70% N/A N/A % Reduction in Volume: Full Thickness Without Exposed N/A N/A Classification: Support Structures Medium N/A N/A Exudate Amount: Serosanguineous N/A N/A Exudate Type: red, brown N/A N/A Exudate Color: Medium (34-66%) N/A N/A Granulation Amount: Pink N/A N/A Granulation Quality: Medium (34-66%) N/A N/A Necrotic Amount: Fat Layer (Subcutaneous Tissue): Yes N/A N/A Exposed Structures: Fascia: No Tendon: No Muscle: No Joint:  No Bone: No None N/A N/A Epithelialization: Treatment Notes Electronic Signature(s) Signed: 09/30/2022 4:34:43 PM By: [name] RN Entered By: [name] on 09/30/2022 13:34:53 -------------------------------------------------------------------------------- [place] Details Patient Name: Date of Service: [name], [name] [name]. 09/30/2022 1:15 PM Medical Record Number: [id number] Patient Account Number: [id number] Date of Birth/Sex: Treating RN: 12/16/1965 (56 y.o. [name] Primary Care Ester Hilley: [name] Other Clinician: Referring Minda Faas: Treating Abigael Mogle/Extender: [name] in Treatment: 4 Active Inactive Necrotic Tissue [place], [place] [name] ([id number]) [phone].pdf Page 5 of 9 Nursing Diagnoses: Knowledge deficit related to management of necrotic/devitalized tissue Goals: Necrotic/devitalized tissue will be minimized in  the wound bed Date Initiated: 08/28/2022 Target Resolution Date: 10/17/2022 Goal Status: Active Interventions: Assess patient pain level pre-, during and post procedure and prior to discharge Notes: Nutrition Nursing Diagnoses: Impaired glucose control: actual or potential Goals: Patient/caregiver will maintain therapeutic glucose control Date Initiated: 08/28/2022 Target Resolution Date: 10/17/2022 Goal Status: Active Interventions: Assess HgA1c results as ordered upon admission and as needed Assess patient nutrition upon admission and as needed per policy Notes: Wound/Skin Impairment Nursing Diagnoses: Knowledge deficit related to ulceration/compromised skin integrity Goals: Patient/caregiver will verbalize understanding of skin care regimen Date Initiated: 08/28/2022 Target Resolution Date: 10/17/2022 Goal Status: Active Ulcer/skin breakdown will have a volume reduction of 30% by week 4 Date Initiated: 08/28/2022 Target Resolution Date: 10/17/2022 Goal Status:  Active Ulcer/skin breakdown will have a volume reduction of 50% by week 8 Date Initiated: 08/28/2022 Target Resolution Date: 11/17/2022 Goal Status: Active Ulcer/skin breakdown will have a volume reduction of 80% by week 12 Date Initiated: 08/28/2022 Target Resolution Date: 12/18/2022 Goal Status: Active Ulcer/skin breakdown will heal within 14 weeks Date Initiated: 08/28/2022 Target Resolution Date: 01/16/2023 Goal Status: Active Interventions: Assess patient/caregiver ability to obtain necessary supplies Assess patient/caregiver ability to perform ulcer/skin care regimen upon admission and as needed Assess ulceration(s) every visit Notes: Electronic Signature(s) Signed: 09/30/2022 2:17:00 PM By: [name] RN Entered By: [name] on 09/30/2022 [place], Joyice Faster ([id number]) [id number] Page 6 of 9 -------------------------------------------------------------------------------- Pain Assessment Details Patient Name: Date of Service: [name] [name]. 09/30/2022 1:15 PM Medical Record Number: [id number] Patient Account Number: [id number] Date of Birth/Sex: Treating RN: 21-Jun-1966 (56 y.o. [name]) [name] Primary Care Brandalynn Ofallon: [name] Other Clinician: Referring Genasis Zingale: Treating Cleva Camero/Extender: [name] in Treatment: 4 Active Problems Location of Pain Severity and Description of Pain Patient Has Paino Yes Site Locations Rate the pain. Current Pain Level: 0 Worst Pain Level: 5 Least Pain Level: 0 Tolerable Pain Level: 5 Character of Pain Describe the Pain: Aching Pain Management and Medication Current Pain Management: Medication: Yes Cold Application: No Rest: Yes Massage: No Activity: No T.E.N.S.: No Heat Application: No Leg drop or elevation: No Is the Current Pain Management Adequate: Inadequate How does your wound impact your activities of daily livingo Sleep: No Bathing: No Appetite:  No Relationship With Others: No Bladder Continence: No Emotions: No Bowel Continence: No Work: No Toileting: No Drive: No Dressing: No Hobbies: No Electronic Signature(s) Signed: 09/30/2022 4:34:43 PM By: [name] RN Entered By: [name] on 09/30/2022 13:20:57 -------------------------------------------------------------------------------- Patient/Caregiver Education Details Patient Name: Date of Service: [name] [name]. 12/12/2023andnbsp1:15 PM Medical Record Number: [id number] Patient Account Number: [id number] Date of Birth/Gender: Treating RN: [birth year] ([address] y.o. [name]) [name] [place], [place] ([id number]) [phone].pdf Page 7 of 9 Primary Care Physician: [name] Other Clinician: Referring Physician: Treating Physician/Extender: [name] in Treatment: 4 Education Assessment Education Provided To: Patient Education Topics Provided Wound Debridement: Methods: Explain/Verbal Responses: State content correctly Electronic Signature(s) Signed: 09/30/2022 4:34:43 PM By: [name] RN Entered By: [name] on 09/30/2022 14:16:42 -------------------------------------------------------------------------------- Wound Assessment Details Patient Name: Date of Service: [name] [name]. 09/30/2022 1:15 PM Medical Record Number: [id number] Patient Account Number: [id number] Date of Birth/Sex: Treating RN: 25-Apr-1966 (56 y.o. [name] Primary Care Chauntel Windsor: [name] Other Clinician: Referring Kooper Chriswell: Treating Alf Doyle/Extender: [name] in Treatment: 4 Wound Status Wound Number: 3 Primary Dehisced Wound Etiology: Wound Location: Left Amputation Site - Below Knee Wound  Open Wounding Event: Surgical Injury Status: Date Acquired: 07/19/2022 Comorbid Cataracts, Anemia, Sleep Apnea, Arrhythmia, Congestive Heart Weeks Of Treatment: 4 History: Failure, Coronary Artery Disease,  Hypertension, Myocardial Clustered Wound: No Infarction, Type II Diabetes, Neuropathy, Seizure Disorder Photos Wound Measurements Length: (cm) 11 Width: (cm) 1.2 Depth: (cm) 1.5 Area: (cm) 10.367 Volume: (cm) 15.551 % Reduction in Area: 23.5% % Reduction in Volume: -282.7% Epithelialization: None Tunneling: No Undermining: No Wound Description [place], Sheppard [name] ([id number]) Classification: Full Thickness Without Exposed Support Structures Exudate Amount: Medium Exudate Type: Serosanguineous Exudate Color: red, brown [id number] Page 8 of 9 Foul Odor After Cleansing: No Slough/Fibrino Yes Wound Bed Granulation Amount: Medium (34-66%) Exposed Structure Granulation Quality: Pink Fascia Exposed: No Necrotic Amount: Medium (34-66%) Fat Layer (Subcutaneous Tissue) Exposed: Yes Necrotic Quality: Adherent Slough Tendon Exposed: No Muscle Exposed: No Joint Exposed: No Bone Exposed: No Treatment Notes Wound #3 (Amputation Site - Below Knee) Wound Laterality: Left Cleanser Peri-Wound Care Topical Santyl Collagenase Ointment, 30 (gm), tube Discharge Instruction: apply nickel thick to wound bed only Primary Dressing Gauze Discharge Instruction: moistened with normal saline over santyl Hydrofera Blue Classic Foam Rope Dressing, 9x6 (mm/in) Discharge Instruction: cut to 1/4 , pointed tip, insert into lateral aspect of wound , change every other day Secondary Dressing ABD Pad 5x9 (in/in) Discharge Instruction: Cover with ABD pad Secured With Medipore T - 85M Medipore H Soft Cloth Surgical T ape ape, 2x2 (in/yd) Tubigrip Size E, 3.5x10 (in/yds) Discharge Instruction: Apply 3 Tubigrip E 3-finger-widths below knee to base of toes to secure dressing and/or for swelling. Compression Wrap Compression Stockings Add-Ons Electronic Signature(s) Signed: 09/30/2022 4:34:43 PM By: [name] RN Entered By: [name] on 09/30/2022  13:33:58 -------------------------------------------------------------------------------- Vitals Details Patient Name: Date of Service: [name], [place] [name]. 09/30/2022 1:15 PM Medical Record Number: [id number] Patient Account Number: [id number] Date of Birth/Sex: Treating RN: [birth year] (56 y.o. [name] Primary Care Theda Payer: [name] Other Clinician: Referring Chiara Coltrin: Treating Pebble Botkin/Extender: [name] in Treatment: 4 Vital Signs [place], [place] ([id number]) [phone].pdf Page 9 of 9 Time Taken: 13:19 Temperature (F): 98.2 Height (in): 70 Pulse (bpm): 80 Weight (lbs): 166 Respiratory Rate (breaths/min): 18 Body Mass Index (BMI): 23.8 Blood Pressure (mmHg): 163/102 Reference Range: 80 - 120 mg / dl Electronic Signature(s) Signed: 09/30/2022 4:34:43 PM By: [name] RN Entered By: [name] on 09/30/2022 13:19:39

## 2022-10-03 DIAGNOSIS — N186 End stage renal disease: Secondary | ICD-10-CM | POA: Diagnosis not present

## 2022-10-03 DIAGNOSIS — I96 Gangrene, not elsewhere classified: Secondary | ICD-10-CM | POA: Diagnosis not present

## 2022-10-03 DIAGNOSIS — N2581 Secondary hyperparathyroidism of renal origin: Secondary | ICD-10-CM | POA: Diagnosis not present

## 2022-10-03 DIAGNOSIS — Z992 Dependence on renal dialysis: Secondary | ICD-10-CM | POA: Diagnosis not present

## 2022-10-03 DIAGNOSIS — D631 Anemia in chronic kidney disease: Secondary | ICD-10-CM | POA: Diagnosis not present

## 2022-10-06 DIAGNOSIS — N186 End stage renal disease: Secondary | ICD-10-CM | POA: Diagnosis not present

## 2022-10-06 DIAGNOSIS — Z992 Dependence on renal dialysis: Secondary | ICD-10-CM | POA: Diagnosis not present

## 2022-10-06 DIAGNOSIS — D631 Anemia in chronic kidney disease: Secondary | ICD-10-CM | POA: Diagnosis not present

## 2022-10-06 DIAGNOSIS — I96 Gangrene, not elsewhere classified: Secondary | ICD-10-CM | POA: Diagnosis not present

## 2022-10-06 DIAGNOSIS — N2581 Secondary hyperparathyroidism of renal origin: Secondary | ICD-10-CM | POA: Diagnosis not present

## 2022-10-07 ENCOUNTER — Encounter: Payer: Medicare Other | Admitting: Physician Assistant

## 2022-10-07 DIAGNOSIS — E1151 Type 2 diabetes mellitus with diabetic peripheral angiopathy without gangrene: Secondary | ICD-10-CM | POA: Diagnosis not present

## 2022-10-07 DIAGNOSIS — L97828 Non-pressure chronic ulcer of other part of left lower leg with other specified severity: Secondary | ICD-10-CM | POA: Diagnosis not present

## 2022-10-07 DIAGNOSIS — I110 Hypertensive heart disease with heart failure: Secondary | ICD-10-CM | POA: Diagnosis not present

## 2022-10-07 DIAGNOSIS — L98499 Non-pressure chronic ulcer of skin of other sites with unspecified severity: Secondary | ICD-10-CM | POA: Diagnosis not present

## 2022-10-07 DIAGNOSIS — T8131XA Disruption of external operation (surgical) wound, not elsewhere classified, initial encounter: Secondary | ICD-10-CM | POA: Diagnosis not present

## 2022-10-07 DIAGNOSIS — E11622 Type 2 diabetes mellitus with other skin ulcer: Secondary | ICD-10-CM | POA: Diagnosis not present

## 2022-10-07 DIAGNOSIS — E1140 Type 2 diabetes mellitus with diabetic neuropathy, unspecified: Secondary | ICD-10-CM | POA: Diagnosis not present

## 2022-10-07 NOTE — Progress Notes (Signed)
MARINO, ROGERSON ([id number]) [id number] Page 1 of 2 Visit Report for 10/07/2022 Chief Complaint Document Details Patient Name: Date of Service: [name] 10/07/2022 8:00 A M Medical Record Number: [id number] Patient Account Number: [id number] Date of Birth/Sex: Treating RN: 07/23/1966 (56 y.o. 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 10/07/2022 8:00 A M Medical Record Number: [id number] Patient Account Number: [id number] Date of Birth/Sex: Treating RN: 1966-09-16 (56 y.o. [name] Primary Care Provider: [name] Other Clinician: Referring Provider: Treating Provider/Extender: [name] in Treatment: 5 Active Problems ICD-10 Encounter Code Description Active Date MDM Diagnosis L97.828 Non-pressure chronic ulcer of other part of left lower leg with other 08/28/2022 No Yes specified severity Z89.512 Acquired absence of left leg below knee 08/28/2022 No Yes E11.51 Type 2 diabetes mellitus with diabetic peripheral angiopathy 08/28/2022 No Yes without gangrene [name], [name] ([id number]) [id number] Page 2 of 2 L03.116 Cellulitis of left lower limb 08/28/2022 No Yes Inactive Problems Resolved Problems Electronic Signature(s) Signed: 10/07/2022 8:07:14 AM By: Worthy Keeler PA-C Entered By: Worthy Keeler on 10/07/2022 08:07:13

## 2022-10-08 DIAGNOSIS — I96 Gangrene, not elsewhere classified: Secondary | ICD-10-CM | POA: Diagnosis not present

## 2022-10-08 DIAGNOSIS — D631 Anemia in chronic kidney disease: Secondary | ICD-10-CM | POA: Diagnosis not present

## 2022-10-08 DIAGNOSIS — T8781 Dehiscence of amputation stump: Secondary | ICD-10-CM | POA: Diagnosis not present

## 2022-10-08 DIAGNOSIS — N2581 Secondary hyperparathyroidism of renal origin: Secondary | ICD-10-CM | POA: Diagnosis not present

## 2022-10-08 DIAGNOSIS — I132 Hypertensive heart and chronic kidney disease with heart failure and with stage 5 chronic kidney disease, or end stage renal disease: Secondary | ICD-10-CM | POA: Diagnosis not present

## 2022-10-08 DIAGNOSIS — N186 End stage renal disease: Secondary | ICD-10-CM | POA: Diagnosis not present

## 2022-10-08 DIAGNOSIS — I5020 Unspecified systolic (congestive) heart failure: Secondary | ICD-10-CM | POA: Diagnosis not present

## 2022-10-08 DIAGNOSIS — Z992 Dependence on renal dialysis: Secondary | ICD-10-CM | POA: Diagnosis not present

## 2022-10-08 DIAGNOSIS — E1122 Type 2 diabetes mellitus with diabetic chronic kidney disease: Secondary | ICD-10-CM | POA: Diagnosis not present

## 2022-10-08 DIAGNOSIS — I959 Hypotension, unspecified: Secondary | ICD-10-CM | POA: Diagnosis not present

## 2022-10-08 NOTE — Progress Notes (Signed)
[address] [place] ([id number]) [id number] Page 1 of 8 Visit Report for 10/07/2022 Arrival Information Details Patient Name: Date of Service: [name] MES L. 10/07/2022 8:00 A M Medical Record Number: [id number] Patient Account Number: [id number] Date of Birth/Sex: Treating RN: 10-Jun-1966 (56 y.o. [name]) [name] Primary Care Kaylee Wombles: [name] Other Clinician: Referring Hatice Bubel: Treating Damyon Mullane/Extender: [name] in Treatment: 5 Visit Information History Since Last Visit Added or deleted any medications: No Patient Arrived: Wheel Chair Any new allergies or adverse reactions: No Arrival Time: 08:01 Had a fall or experienced change in No Accompanied By: wife activities of daily living that may affect Transfer Assistance: None risk of falls: Patient Identification Verified: Yes Signs or symptoms of abuse/neglect since last visito No Secondary Verification Process Completed: Yes Hospitalized since last visit: No Patient Requires Transmission-Based Precautions: No Implantable device outside of the clinic excluding No Patient Has Alerts: No cellular tissue based products placed in the center since last visit: Has Dressing in Place as Prescribed: Yes Has Compression in Place as Prescribed: Yes Pain Present Now: No Electronic Signature(s) Signed: 10/08/2022 4:14:17 PM By: [name] RN Entered By: [name] on 10/07/2022 08:05:17 -------------------------------------------------------------------------------- Clinic Level of Care Assessment Details Patient Name: Date of Service: [name] MES L. 10/07/2022 8:00 A M Medical Record Number: [id number] Patient Account Number: [id number] Date of Birth/Sex: Treating RN: 1966-02-25 (56 y.o. [name]) [name] Primary Care Kei Mcelhiney: [name] Other Clinician: Referring [name]: Treating [name]/Extender: [name] in Treatment: 5 Clinic Level of  Care Assessment Items TOOL 1 Quantity Score []  - 0 Use when EandM and Procedure is performed on INITIAL visit ASSESSMENTS - Nursing Assessment / Reassessment []  - 0 General Physical Exam (combine w/ comprehensive assessment (listed just below) when performed on new pt. evals) []  - 0 Comprehensive Assessment (HX, ROS, Risk Assessments, Wounds Hx, etc.) Andel, Aldan L ([id number]) [id number] Page 2 of 8 ASSESSMENTS - Wound and Skin Assessment / Reassessment []  - 0 Dermatologic / Skin Assessment (not related to wound area) ASSESSMENTS - Ostomy and/or Continence Assessment and Care []  - 0 Incontinence Assessment and Management []  - 0 Ostomy Care Assessment and Management (repouching, etc.) PROCESS - Coordination of Care []  - 0 Simple Patient / Family Education for ongoing care []  - 0 Complex (extensive) Patient / Family Education for ongoing care []  - 0 Staff obtains Programmer, systems, Records, T Results / Process Orders est []  - 0 Staff telephones HHA, Nursing Homes / Clarify orders / etc []  - 0 Routine Transfer to another Facility (non-emergent condition) []  - 0 Routine Hospital Admission (non-emergent condition) []  - 0 New Admissions / Biomedical engineer / Ordering NPWT Apligraf, etc. , []  - 0 Emergency Hospital Admission (emergent condition) PROCESS - Special Needs []  - 0 Pediatric / Minor Patient Management []  - 0 Isolation Patient Management []  - 0 Hearing / Language / Visual special needs []  - 0 Assessment of Community assistance (transportation, D/C planning, etc.) []  - 0 Additional assistance / Altered mentation []  - 0 Support Surface(s) Assessment (bed, cushion, seat, etc.) INTERVENTIONS - Miscellaneous []  - 0 External ear exam []  - 0 Patient Transfer (multiple staff / Civil Service fast streamer / Similar devices) []  - 0 Simple Staple / Suture removal (25 or less) []  - 0 Complex Staple / Suture removal (26 or more) []  - 0 Hypo/Hyperglycemic  Management (do not check if billed separately) []  - 0 Ankle / Brachial Index (ABI) - do not check if billed separately  Has the patient been seen at the hospital within the last three years: Yes Total Score: 0 Level Of Care: ____ Electronic Signature(s) Signed: 10/08/2022 4:14:17 PM By: [name] RN Entered By: [name] on 10/07/2022 08:37:18 -------------------------------------------------------------------------------- Encounter Discharge Information Details Patient Name: Date of Service: [name], [name] MES L. 10/07/2022 8:00 A M Medical Record Number: [id number] Patient Account Number: [id number] Date of Birth/Sex: Treating RN: Mar 25, 1966 (56 y.o. [name] Primary Care Aalaysia Liggins: [name] Other Clinician: Referring Tariah Transue: Treating Rudy Domek/Extender: Dorothea Glassman [place], [place] ([id number]) [phone].pdf Page 3 of 8 Weeks in Treatment: 5 Encounter Discharge Information Items Post Procedure Vitals Discharge Condition: Stable Temperature (F): 98.4 Ambulatory Status: Wheelchair Pulse (bpm): 65 Discharge Destination: Home Respiratory Rate (breaths/min): 18 Transportation: Private Auto Blood Pressure (mmHg): 161/89 Accompanied By: wife Schedule Follow-up Appointment: Yes Clinical Summary of Care: Electronic Signature(s) Signed: 10/08/2022 4:14:17 PM By: [name] RN Entered By: [name] on 10/07/2022 08:38:31 -------------------------------------------------------------------------------- Lower Extremity Assessment Details Patient Name: Date of Service: [name] MES L. 10/07/2022 8:00 A M Medical Record Number: [id number] Patient Account Number: [id number] Date of Birth/Sex: Treating RN: 1966-10-17 (56 y.o. [name] Primary Care [name]: [name] Other Clinician: Referring [name]: Treating [name]/Extender: [name] in Treatment: 5 Electronic Signature(s) Signed:  10/08/2022 4:14:17 PM By: [name] RN Entered By: [name] on 10/07/2022 08:14:32 -------------------------------------------------------------------------------- Multi Wound Chart Details Patient Name: Date of Service: [name], [name] MES L. 10/07/2022 8:00 A M Medical Record Number: [id number] Patient Account Number: [id number] Date of Birth/Sex: Treating RN: [birth year] (56 y.o. [name] Primary Care Shavonna Corella: [name] Other Clinician: Referring Pearlie Lafosse: Treating Kemari Narez/Extender: [name] in Treatment: 5 Vital Signs Height(in): 70 Pulse(bpm): 65 Weight(lbs): 166 Blood Pressure(mmHg): 161/89 Body Mass Index(BMI): 23.8 Temperature(F): 98.4 Respiratory Rate(breaths/min): 18 [Lucero, Karla L ([id number]):Photos:] [[id number].pdf Page 4 of 8:3 N/A N/A N/A N/A] Left Amputation Site - Below Knee N/A N/A Wound Location: Surgical Injury N/A N/A Wounding Event: Dehisced Wound N/A N/A Primary Etiology: Cataracts, Anemia, Sleep Apnea, N/A N/A Comorbid History: Arrhythmia, Congestive Heart Failure, Coronary Artery Disease, Hypertension, Myocardial Infarction, Type II Diabetes, Neuropathy, Seizure Disorder 07/19/2022 N/A N/A Date Acquired: 5 N/A N/A Weeks of Treatment: Open N/A N/A Wound Status: No N/A N/A Wound Recurrence: 2.7x0.8x0.6 N/A N/A Measurements L x W x D (cm) 1.696 N/A N/A A (cm) : rea 1.018 N/A N/A Volume (cm) : 87.50% N/A N/A % Reduction in Area: 75.00% N/A N/A % Reduction in Volume: Full Thickness Without Exposed N/A N/A Classification: Support Structures Medium N/A N/A Exudate Amount: Serosanguineous N/A N/A Exudate Type: red, brown N/A N/A Exudate Color: Medium (34-66%) N/A N/A Granulation Amount: Pink N/A N/A Granulation Quality: Medium (34-66%) N/A N/A Necrotic Amount: Fat Layer (Subcutaneous Tissue): Yes N/A N/A Exposed Structures: Fascia: No Tendon: No Muscle: No Joint:  No Bone: No None N/A N/A Epithelialization: Treatment Notes Electronic Signature(s) Signed: 10/08/2022 4:14:17 PM By: [name] RN Entered By: [name] on 10/07/2022 08:14:37 -------------------------------------------------------------------------------- [place] Details Patient Name: Date of Service: [name], [name] MES L. 10/07/2022 8:00 A M Medical Record Number: [id number] Patient Account Number: [id number] Date of Birth/Sex: Treating RN: May 24, 1966 (56 y.o. [name] Primary Care Teshawn Moan: [name] Other Clinician: Referring Myiesha Edgar: Treating Sila Sarsfield/Extender: [name] in Treatment: 5 Active Inactive Necrotic Tissue [place], Marie L ([id number]) [id number] Page 5 of 8 Nursing Diagnoses: Knowledge deficit related to management of necrotic/devitalized tissue Goals:  Necrotic/devitalized tissue will be minimized in the wound bed Date Initiated: 08/28/2022 Target Resolution Date: 10/17/2022 Goal Status: Active Interventions: Assess patient pain level pre-, during and post procedure and prior to discharge Notes: Nutrition Nursing Diagnoses: Impaired glucose control: actual or potential Goals: Patient/caregiver will maintain therapeutic glucose control Date Initiated: 08/28/2022 Target Resolution Date: 10/17/2022 Goal Status: Active Interventions: Assess HgA1c results as ordered upon admission and as needed Assess patient nutrition upon admission and as needed per policy Notes: Wound/Skin Impairment Nursing Diagnoses: Knowledge deficit related to ulceration/compromised skin integrity Goals: Patient/caregiver will verbalize understanding of skin care regimen Date Initiated: 08/28/2022 Target Resolution Date: 10/17/2022 Goal Status: Active Ulcer/skin breakdown will have a volume reduction of 30% by week 4 Date Initiated: 08/28/2022 Target Resolution Date: 10/17/2022 Goal Status:  Active Ulcer/skin breakdown will have a volume reduction of 50% by week 8 Date Initiated: 08/28/2022 Target Resolution Date: 11/17/2022 Goal Status: Active Ulcer/skin breakdown will have a volume reduction of 80% by week 12 Date Initiated: 08/28/2022 Target Resolution Date: 12/18/2022 Goal Status: Active Ulcer/skin breakdown will heal within 14 weeks Date Initiated: 08/28/2022 Target Resolution Date: 01/16/2023 Goal Status: Active Interventions: Assess patient/caregiver ability to obtain necessary supplies Assess patient/caregiver ability to perform ulcer/skin care regimen upon admission and as needed Assess ulceration(s) every visit Notes: Electronic Signature(s) Signed: 10/08/2022 4:14:17 PM By: [name] RN Entered By: [name] on 10/07/2022 08:37:45 Bernat, Joyice Faster ([id number]) [id number] Page 6 of 8 -------------------------------------------------------------------------------- Pain Assessment Details Patient Name: Date of Service: [name] MES L. 10/07/2022 8:00 A M Medical Record Number: [id number] Patient Account Number: [id number] Date of Birth/Sex: Treating RN: [birth year] (56 y.o. [name]) [name] Primary Care Jakhiya Brower: [name] Other Clinician: Referring Philisha Weinel: Treating Margareth Kanner/Extender: [name] in Treatment: 5 Active Problems Location of Pain Severity and Description of Pain Patient Has Paino No Site Locations Pain Management and Medication Current Pain Management: Electronic Signature(s) Signed: 10/08/2022 4:14:17 PM By: [name] RN Entered By: [name] on 10/07/2022 08:05:41 -------------------------------------------------------------------------------- Patient/Caregiver Education Details Patient Name: Date of Service: [name] MES L. 12/19/2023andnbsp8:00 [place] Record Number: [id number] Patient Account Number: [id number] Date of Birth/Gender: Treating RN: 1966-10-12 (56 y.o. [name] Primary Care Physician: [name] Other Clinician: Referring Physician: Treating Physician/Extender: [name] in Treatment: 5 Education Assessment Education Provided To: Patient Education Topics Provided Wound/Skin Impairment: Methods: Explain/Verbal Responses: State content correctly [place], [place] L ([id number]) [id number] Page 7 of 8 Electronic Signature(s) Signed: 10/08/2022 4:14:17 PM By: [name] RN Entered By: [name] on 10/07/2022 08:37:35 -------------------------------------------------------------------------------- Wound Assessment Details Patient Name: Date of Service: [name], [name] MES L. 10/07/2022 8:00 A M Medical Record Number: [id number] Patient Account Number: [id number] Date of Birth/Sex: Treating RN: November 03, 1965 (56 y.o. [name]) [name] Primary Care Evren Shankland: [name] Other Clinician: Referring Annick Dimaio: Treating Maida Widger/Extender: [name] in Treatment: 5 Wound Status Wound Number: 3 Primary Dehisced Wound Etiology: Wound Location: Left Amputation Site - Below Knee Wound Open Wounding Event: Surgical Injury Status: Date Acquired: 07/19/2022 Comorbid Cataracts, Anemia, Sleep Apnea, Arrhythmia, Congestive Heart Weeks Of Treatment: 5 History: Failure, Coronary Artery Disease, Hypertension, Myocardial Clustered Wound: No Infarction, Type II Diabetes, Neuropathy, Seizure Disorder Photos Wound Measurements Length: (cm) 2.7 Width: (cm) 0.8 Depth: (cm) 0.6 Area: (cm) 1.696 Volume: (cm) 1.018 % Reduction in Area: 87.5% % Reduction in Volume: 75% Epithelialization: None Tunneling: No Undermining: No Wound Description Classification: Full Thickness Without Exposed Suppor Exudate Amount: Medium  Exudate Type: Serosanguineous Exudate Color: red, brown t Structures Foul Odor After Cleansing: No Slough/Fibrino Yes Wound Bed Granulation Amount: Medium  (34-66%) Exposed Structure Granulation Quality: Pink Fascia Exposed: No Necrotic Amount: Medium (34-66%) Fat Layer (Subcutaneous Tissue) Exposed: Yes Necrotic Quality: Adherent Slough Tendon Exposed: No Muscle Exposed: No Joint Exposed: No Bone Exposed: No Keasling, Fouad L ([id number]) [id number] Page 8 of 8 Treatment Notes Wound #3 (Amputation Site - Below Knee) Wound Laterality: Left Cleanser Peri-Wound Care Topical Santyl Collagenase Ointment, 30 (gm), tube Discharge Instruction: apply nickel thick to wound bed only Primary Dressing Gauze Discharge Instruction: moistened with santyl Secondary Dressing Gauze Secured With Medipore T - 76M Medipore H Soft Cloth Surgical T ape ape, 2x2 (in/yd) Tubigrip Size D, 3x10 (in/yd) Discharge Instruction: double layer to thigh Compression Wrap Compression Stockings Add-Ons Electronic Signature(s) Signed: 10/08/2022 4:14:17 PM By: [name] RN Entered By: [name] on 10/07/2022 08:14:16 -------------------------------------------------------------------------------- Vitals Details Patient Name: Date of Service: [name], JA MES L. 10/07/2022 8:00 A M Medical Record Number: [id number] Patient Account Number: [id number] Date of Birth/Sex: Treating RN: December 28, 1965 (56 y.o. [name]) [name] Primary Care Cydney Alvarenga: [name] Other Clinician: Referring Yoselyn Mcglade: Treating Devyn Griffing/Extender: [name] in Treatment: 5 Vital Signs Time Taken: 08:05 Temperature (F): 98.4 Height (in): 70 Pulse (bpm): 65 Weight (lbs): 166 Respiratory Rate (breaths/min): 18 Body Mass Index (BMI): 23.8 Blood Pressure (mmHg): 161/89 Reference Range: 80 - 120 mg / dl Electronic Signature(s) Signed: 10/08/2022 4:14:17 PM By: [name] RN Entered By: [name] on 10/07/2022 08:05:34

## 2022-10-09 DIAGNOSIS — I959 Hypotension, unspecified: Secondary | ICD-10-CM | POA: Diagnosis not present

## 2022-10-09 DIAGNOSIS — T8781 Dehiscence of amputation stump: Secondary | ICD-10-CM | POA: Diagnosis not present

## 2022-10-09 DIAGNOSIS — I132 Hypertensive heart and chronic kidney disease with heart failure and with stage 5 chronic kidney disease, or end stage renal disease: Secondary | ICD-10-CM | POA: Diagnosis not present

## 2022-10-09 DIAGNOSIS — I5020 Unspecified systolic (congestive) heart failure: Secondary | ICD-10-CM | POA: Diagnosis not present

## 2022-10-09 DIAGNOSIS — N186 End stage renal disease: Secondary | ICD-10-CM | POA: Diagnosis not present

## 2022-10-09 DIAGNOSIS — E1122 Type 2 diabetes mellitus with diabetic chronic kidney disease: Secondary | ICD-10-CM | POA: Diagnosis not present

## 2022-10-10 DIAGNOSIS — N186 End stage renal disease: Secondary | ICD-10-CM | POA: Diagnosis not present

## 2022-10-10 DIAGNOSIS — N2581 Secondary hyperparathyroidism of renal origin: Secondary | ICD-10-CM | POA: Diagnosis not present

## 2022-10-10 DIAGNOSIS — I96 Gangrene, not elsewhere classified: Secondary | ICD-10-CM | POA: Diagnosis not present

## 2022-10-10 DIAGNOSIS — Z992 Dependence on renal dialysis: Secondary | ICD-10-CM | POA: Diagnosis not present

## 2022-10-10 DIAGNOSIS — D631 Anemia in chronic kidney disease: Secondary | ICD-10-CM | POA: Diagnosis not present

## 2022-10-11 DIAGNOSIS — I96 Gangrene, not elsewhere classified: Secondary | ICD-10-CM | POA: Diagnosis not present

## 2022-10-11 DIAGNOSIS — N186 End stage renal disease: Secondary | ICD-10-CM | POA: Diagnosis not present

## 2022-10-11 DIAGNOSIS — Z992 Dependence on renal dialysis: Secondary | ICD-10-CM | POA: Diagnosis not present

## 2022-10-11 DIAGNOSIS — N2581 Secondary hyperparathyroidism of renal origin: Secondary | ICD-10-CM | POA: Diagnosis not present

## 2022-10-11 DIAGNOSIS — D631 Anemia in chronic kidney disease: Secondary | ICD-10-CM | POA: Diagnosis not present

## 2022-10-14 ENCOUNTER — Encounter: Payer: Medicare Other | Admitting: Internal Medicine

## 2022-10-14 DIAGNOSIS — E11622 Type 2 diabetes mellitus with other skin ulcer: Secondary | ICD-10-CM | POA: Diagnosis not present

## 2022-10-14 DIAGNOSIS — E1140 Type 2 diabetes mellitus with diabetic neuropathy, unspecified: Secondary | ICD-10-CM | POA: Diagnosis not present

## 2022-10-14 DIAGNOSIS — L98499 Non-pressure chronic ulcer of skin of other sites with unspecified severity: Secondary | ICD-10-CM | POA: Diagnosis not present

## 2022-10-14 DIAGNOSIS — T8189XA Other complications of procedures, not elsewhere classified, initial encounter: Secondary | ICD-10-CM | POA: Diagnosis not present

## 2022-10-14 DIAGNOSIS — I110 Hypertensive heart disease with heart failure: Secondary | ICD-10-CM | POA: Diagnosis not present

## 2022-10-14 DIAGNOSIS — L97828 Non-pressure chronic ulcer of other part of left lower leg with other specified severity: Secondary | ICD-10-CM | POA: Diagnosis not present

## 2022-10-14 DIAGNOSIS — E1151 Type 2 diabetes mellitus with diabetic peripheral angiopathy without gangrene: Secondary | ICD-10-CM | POA: Diagnosis not present

## 2022-10-14 NOTE — Progress Notes (Signed)
ZAKEE, DEERMAN ([id number]) [id number] Page 1 of 8 Visit Report for 10/14/2022 HPI Details Patient Name: Date of Service: [name] 10/14/2022 10:15 A M Medical Record Number: [id number] Patient Account Number: [id number] Date of Birth/Sex: Treating RN: 03/09/1966 (56 y.o. [name]) [name] Primary Care Provider: [name] Other Clinician: Referring Provider: Treating Provider/Extender: RO BSO N, MICHA EL [place]bert [place] in Treatment: 6 History of Present Illness HPI Description: 10/31/2021 patient presents today for evaluation here in our clinic. He is actually being evaluated for hyperbaric oxygen therapy only. He has been seen at [facility] up to this point he is under the care of the vascular surgery specialty. Subsequently he is also under the care of the hyperbaric center there. With that being said that due to the fact that he is a dialysis patient he was actually recommended to be transferred to [place] for his treatments he actually lives in [place] which is where his dialysis is. It was a hardship for him to try to get from [place] especially on dialysis days all the way to [facility] get into the facility for his treatment and get back home he was literally spending all day long. He has had just 4 treatments there at [facility] thus far based on what I see in these have not been continued while he awaits approval here in our clinic. Subsequently I do have records for review that will be included in the HPI predetermination review and attached to this chart as well. I will not duplicate that here. 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 Nonetheless he continues to have exposed bone with marked soft tissue swelling compatible with osteomyelitis. This is due to the irregularity of the head  of the metatarsal as well. Nonetheless along with having associated cellulitis he is also good to be seen by infectious disease and is currently on antibiotics for this as well. Again that will be detailed in the pretreatment review section which I will attach to this note as well. He does currently have a wound VAC in place and again the wound was not evaluated here in the clinic as that is being completely managed by home health and [facility]. The patient does have a history of diabetes mellitus type 2, hypertension, coronary artery disease, and congestive heart failure. He also has cataracts of both eyes but no evidence of glaucoma on his most recent eye exam. 11/22/2021 we have been seeing this gentleman for hyperbaric oxygen therapy but have not actually evaluated his wound up to this point this was being managed by the wound care and vascular team I presumed at [facility] at least that is what has been told to be previous. Nonetheless at this point I got a call from Atlee Abide who is a Librarian, academic at the [facility] vascular clinic with Dr. Durene Fruits and subsequently they were wanting to know if I could take over wound care for this patient. Apparently they do not have the ability for an office debridement which again I completely understand but nonetheless definitely is not an integral part of his healing along with the hyperbarics. Subsequently I discussed with the patient today that I do believe he would benefit going ahead and proceeding with evaluation here in the clinic for that reason I did go ahead and see him today  to get things started. There is also been some confusion about the issues with his home health nurse and getting measurements to [facility] for the wound VAC in order to continue his wound VAC therapy. With that being said after I see him today we will make a determination on what to do with wound VAC we can definitely send measurements to [facility] but again the bigger question is  whether this is the best way to go or not. 11/28/2021 upon evaluation today patient appears to be doing decently well in regard to his wound. He has been tolerating the dressing changes with the Dakin's moistened gauze dressing which I think is doing a good job. Fortunately I do not see any signs of active infection locally nor systemically at this point. In general I think that he is actually making excellent progress which is great news. 2/17; seen in conjunction with HBO today. 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 Much improved condition of the wound surface. On the lateral foot and eschared area that he has been using Betadine. T olerating HBO well 12/13/2021 upon evaluation today patient appears to be doing pretty well in regard to his foot ulcer. I am actually much more pleased currently that I been with the way the appearance looks today. The wound bed does show some signs of slough and biofilm on the surface of the wound this is minimal I Minna perform debridement the I will do so very carefully due to the fact that to be honest the patient had a significant amount of bleeding in the past being on the Brilinta he had a difficult time clotting when I debrided him previously quite significantly. Fortunately I do not see any signs of active infection at this time which is great news. No fevers, chills, nausea, vomiting, or diarrhea. 12/20/2021 upon evaluation today patient actually appears to be showing some signs of excellent improvement here. I am very pleased with where things stand currently. There does not appear to be any evidence of active infection locally nor systemically which is great news and I do believe that with the hyperbaric oxygen therapy he is actually making excellent progress. 3/9; patient presents for follow-up. He continues to do HBO therapy without issues. He has completed 34 out of 40 sessions. He continues to use Dakin's wet- to-dry to  the amputation site and Betadine to the lateral foot wound. he currently denies signs of infection. 01/03/2022 upon evaluation today patient appears to be doing better in regard to his distal foot ulcer. Unfortunately the lateral foot is a different story this area has softened up and is getting need to be debrided away today. I Georgina Peer do this with scissors and forceps and was able to clear that out that will be detailed below. Otherwise I feel like the Dakin's moistened gauze dressing is doing an awesome job for him and he is making significantly improvements here. 01/10/2022 upon evaluation today patient continues to have some of the necrotic tissue loosen up on the lateral portion of his foot the main wound that we have been treating is actually doing quite well. Overall very pleased with where things stand in general. No fevers, chills, nausea, vomiting, or diarrhea. 01/17/2022 upon evaluation today patient appears to be doing somewhat poorly in regard to the lateral foot wound. The main wound that we initially were seeing him for looks to be doing great. This lateral foot has worsened and unfortunately it appears to  potentially have some signs of infection here. I do believe that we may need to see about getting started on some IV antibiotics he is in dialysis so they could potentially do this for him, Monday. Nonetheless I want to try to get that started ASAP. Again I would prefer him be initiated with IV vancomycin and cefepime just as an empiric broad-spectrum until we can get the culture results back at least. Subsequently also think that this should be done sooner rather than later due to the fact that his leg is more swollen and overall this seems to have progressed fairly rapidly since last week I do not want this to get worse. He voiced understanding. JOEY, LIERMAN ([id number]) [id number] Page 2 of 8 01-24-2022 upon evaluation today patient appears to be doing well  with regard to his main ulcer that we have been seeing him for. I am very pleased with where things stand currently. He still continues to have bleeding if we do any type of aggressive sharp debridement there for him having to be very careful using Dakin's solution along with debridement as I can here in the office. Overall the patient seems to be doing well. Unfortunately the lateral foot wound still has progressed a little bit further. We did get him on antibiotics starting this past Monday I think the drainage looks much better and overall I am seeing improvement but again this was already broken down to a certain degree I am hopeful by next week this will really start turnaround for [place]. We will also get a send him for an x-ray to evaluate for any signs of osteomyelitis on this lateral foot. 01-31-2022 upon evaluation today patient's wound on the medial left amputation site is actually doing excellent. I am very pleased with where things stand this is significantly smaller and we are headed in the right direction. In regard to the left lateral foot there is still some necrotic tissue to be cleared away although I see evidence of this starting to basically solidify as far as the edges are concerned with how far it spread I do not see anything seeming to want to spread further. I believe this may be close to done as far as any evolving is concerned. 02-07-2022 upon evaluation today patient appears to be doing well with regard to the main amputation site. Unfortunately the secondary site on the left lateral foot is still giving him some trouble here. There is a lot of dried tissue and I do believe that along with the Dakin's we may want to use a contact layer in the base of the wound to try to prevent the bone from drying out because we are down to that point at this time. The patient voiced understanding at this point as well and is happy to do what ever it takes to try to get this better. With that  being said I do believe that it is possible he may benefit from a wound VAC. Again the question is the approval process for getting this done obviously we can see what we can do if we get things cleaned a little bit better but right now I think the best option is going to be for [place] to continue with the packing currently. 02-14-2022 upon evaluation today patient presents for follow-up concerning ongoing issues with his foot. He did have the MRI which does show that he has evidence of osteomyelitis of the digit at the fifth location of his foot. Subsequently this does extend  down to the base of the digit and into the proximal portion of the metatarsal as well. With that being said the bone does not appear to be too severely broken down and a lot of the marrow signal is stated to be maintained which is good news. Nonetheless I do believe that he is unfortunately still in a spot where this is precarious and we need to make sure that we are treating this appropriately. He does seem to be doing well with IV antibiotics he also seems to be doing quite well when it comes to the overall appearance of the wound bed at this point. I am happy in that regard. 02-21-2022 upon evaluation today patient appears to be doing well all things considered in regard to his wounds. I do feel like that he is making some progress here. The wound on the right distal foot where the amputation site is is actually healing nicely I feel like we can switch over to collagen at this point that should do well for him. In regard to the left lateral foot this is actually getting much cleaner he still has some area where there seems to be some pressure getting to the location I think that he would do well with a knee scooter at this point. I discussed that with him he does not seem prone to going that route at this point he understands it can definitely help. 02-28-2022 upon evaluation today patient appears to be doing well currently with  regard to the wound on his foot. The main surgical wound is actually significantly improved and the lateral wound is doing better there is still bone exposed but nonetheless this does not appear to be significantly worse than what we have noted previous. In fact I think it looks better than it did last week. My goal is to keep the bone from drying out and subsequently for that reason we are going to go ahead and proceed with the EpiFix which I think is good to be a good option here for him. Over top of the EpiFix we will do a contact layer in order to hopefully keep things from drying out. This dressing will actually stay in place until he comes back. Patient did see vascular yesterday and states that they did not feel like anything was doing worse still it appears that if it were left up to them they think the ideal thing would be to proceed with an amputation. I explained to the patient that to be honest is not always avoidable but if we can try to avoid amputation that is what we are after. He voiced understanding and he does want to do what ever he can to try to prevent amputation for that reason we are proceeding with the epi cord today. This will be application #1. [id number] upon evaluation today patient appears to be making some progress here regard to the lateral foot. Fortunately there does not appear to be any signs of active infection locally or systemically which is great news. No fevers, chills, nausea, vomiting, or diarrhea. I do think that he is appropriate for second application of EpiCord today. 03-14-2022 upon evaluation today patient's wound still has significant exposure of the bone at this point. He did get crutches he has been trying to use those the past couple of days am not sure that is going so well to be perfectly honest. We did discuss however offloading is important and we even discussed a total contact cast but to be  perfectly honest right now and feels like he may need more  debridement to clear away some of the bone which is not viable in the end of the foot of the fifth metatarsal but I am unable to really do that here in the office due to his bleeding. I do not know if potentially we could have him seen by podiatry potentially Dr. Merril Abbe to see if there is anything he can do to help in this regard. I am not even certain whether the majority of the metatarsal is even viable to be honest. Either way I do think that he would benefit from surgical debridement or even possibly partial ray amputation in order for [place] to try to keep things moving in the right direction and get this closed. This was all discussed with the patient today. With that being said I know he wants to avoid a below-knee amputation which is what has previously been recommended. 03-21-2022 upon evaluation today patient appears to be doing well in regard to the medial wound at this is shown signs of excellent improvement. In regard to the lateral wound this is a bit dry compared to what I want to see him Regino Schultze not draining as much but I think that we need to see what we can do about trying to keep this from drying out and keeping it a bit more moist. Patient voiced understanding. Overall I am very pleased otherwise with where we stand. 03-27-2022 upon evaluation today patient appears to be doing well currently in regard to his wound especially on the medial aspect this looks really good the lateral aspect is looking a little bit better to with some healthier appearance to the bone compared to last week to be honest. Fortunately I do not see any signs of active infection locally or systemically at this time which is great news. No fevers, chills, nausea, vomiting, or diarrhea. READMISSION 08/28/2022 This is a 56 year old man who is a diabetic on dialysis but not currently on any treatment for it. He has a known history of PAD. He was in this clinic for a protracted period of time this year for a left foot  wound with underlying osteomyelitis for which she was treated with hyperbaric oxygen. As I understand things he left the clinic in June with wounds and an unhealed state possibly infected. 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 Unfortunately in September he required a revision of the BKA but he was ultimately admitted to [facility] for a protracted period with bacteremia secondary to group A strep from 9/12-10/12. Marland Kitchen He is recently completed vancomycin he was taking at dialysis He has been left with a nonhealing surgical wound on his left BKA amputation site. He recently followed with vascular surgery who recommended Santyl and Vashe solution which they have been applying to the wound every day. It is noted that he does have severe PAD in the right lower extremity that is status post transmetatarsal amputation he is going to follow him up in 3 months. He is also seen infectious disease at [facility] who state that he had MRSA treated with cefazolin and vancomycin at hemodialysis. However he is listed as having an allergic reaction to cefazolin including lip swelling therefore he completed vancomycin. He is not felt to require any additional antibiotics. 09-04-2022 upon evaluation today patient appears to be doing well currently in regard to his wound on  the medial aspect of this is amputation site where he had a BKA. On the lateral aspect however he has an area of the incision line that is open that actually goes somewhat deep. We did probe about 4 cm down although I do not feel any bone directly. Fortunately there does not appear to be any signs of active infection locally or systemically at this time. There is some concern here in my estimation about the possibility of infection and I think that he may need some extension of the antibiotics I Georgina Peer probably put him on linezolid as he was previously on vancomycin. 09-16-2022 upon evaluation  today patient appears to be doing well currently in regard to his wound. There is good to be some need for sharp debridement today but fortunately nothing too significant. Fortunately I do not see any signs of active infection locally nor systemically at this time which is great news. No fevers, chills, nausea, vomiting, or diarrhea. 09-23-2022 upon evaluation today patient appears to be doing well currently in regard to his wounds that were still in the cleaning up phase in regard to the primary wound where he dehisced. There is a lot of necrotic tissue that is can have to be cleaned out working on that at this point. 09-30-2022 upon evaluation today patient appears to be doing well currently in regard to his wounds. He is slowly making progress and getting the main open CEASER, EBELING ([id number]) [id number] Page 3 of 8 area on the medial aspect of his incision line in a much healthier state. In general I feel like that we are headed in the right direction. I do not see any signs of active infection at this time. 10-07-2022 upon evaluation today patient appears to be doing well currently in regard to his wound. He has been tolerating the dressing changes without complication of the lateral portion of the wound actually is closed the more medial portion is doing much better the swelling overall is excellent. He has been using the Tubigrip as a compression on the end of the stump and that has done extremely well for him. 12/26; unfortunately the patient's wound on the left BKA stump has a deeper area from 6-9 o'clock. There is not been any drainage she is not complaining of any pain. He states that he has been up in his wheelchair with his stump dependent quite a bit. This might account for why the edema in the stump seems out of control. Electronic Signature(s) Signed: 10/14/2022 4:30:46 PM By: Linton Ham MD Entered By: Linton Ham on 10/14/2022  10:35:56 -------------------------------------------------------------------------------- Physical Exam Details Patient Name: Date of Service: [name], [name] MES L. 10/14/2022 10:15 A M Medical Record Number: [id number] Patient Account Number: [id number] Date of Birth/Sex: Treating RN: [birth year] (56 y.o. [name] Primary Care Provider: [name] Other Clinician: Referring Provider: Treating Provider/Extender: RO BSO N, MICHA EL [place]bert [place] in Treatment: 6 Constitutional Patient is hypotensive.. Pulse regular and within target range for patient.Marland Kitchen Respirations regular, non-labored and within target range.. Temperature is normal and within the target range for the patient.Marland Kitchen appears in no distress. Notes Wound exam; left stump. The open areas on the medial aspect. Initially the surface looks quite healthy however from of 6-9 o'clock there is about a 1.5 cm tunnel. This is worse than what was measured last week. The rest of the surface of the wound does not look unhealthy there is no surrounding tenderness no purulence no drainage. However there is  poorly controlled swelling around the stump incision itself Electronic Signature(s) Signed: 10/14/2022 4:30:46 PM By: Linton Ham MD Entered By: Linton Ham on 10/14/2022 10:40:19 -------------------------------------------------------------------------------- Physician Orders Details Patient Name: Date of Service: [name], [name] MES L. 10/14/2022 10:15 A M Medical Record Number: [id number] Patient Account Number: [id number] Date of Birth/Sex: Treating RN: 1966/02/19 (56 y.o. [name] Primary Care Provider: [name] Other Clinician: Referring Provider: Treating Provider/Extender: RO BSO N, MICHA EL [place]bert [place] in Treatment: 6 Verbal / Phone Orders: No [address] NAKEEM, MURNANE ([id number]) [id number] Page 4 of 8 Follow-up Appointments Return Appointment in 1  week. [place] for wound care. May utilize formulary equivalent dressing for wound treatment orders unless otherwise specified. Home Health Nurse may visit PRN to address patients wound care needs. Jackquline Denmark [phone] Bathing/ Shower/ Hygiene May shower; gently cleanse wound with antibacterial soap, rinse and pat dry prior to dressing wounds Edema Control - Lymphedema / Segmental Compressive Device / Other Tubigrip double layer applied - to stump and above knee Wound Treatment Wound #3 - Amputation Site - Below Knee Wound Laterality: Left Prim Dressing: Silvercel 4 1/4x 4 1/4 (in/in) 1 x Per Day/30 Days ary Discharge Instructions: Apply Silvercel 4 1/4x 4 1/4 (in/in) as instructed Secondary Dressing: Gauze 1 x Per Day/30 Days Secured With: Medipore T - 47M Medipore H Soft Cloth Surgical T ape ape, 2x2 (in/yd) 1 x Per Day/30 Days Secured With: Tubigrip Size D, 3x10 (in/yd) 1 x Per Day/30 Days Discharge Instructions: double layer to thigh Electronic Signature(s) Signed: 10/14/2022 4:30:46 PM By: Linton Ham MD Signed: 10/14/2022 4:50:51 PM By: [name] RN Entered By: [name] on 10/14/2022 10:30:01 -------------------------------------------------------------------------------- Problem List Details Patient Name: Date of Service: [name], [name] MES L. 10/14/2022 10:15 A M Medical Record Number: [id number] Patient Account Number: [id number] Date of Birth/Sex: Treating RN: 1966/02/20 (56 y.o. [name]) [name] Primary Care Provider: [name] Other Clinician: Referring Provider: Treating Provider/Extender: RO BSO N, MICHA EL [place]bert [place] in Treatment: 6 Active Problems ICD-10 Encounter Code Description Active Date MDM Diagnosis L97.828 Non-pressure chronic ulcer of other part of left lower leg with other specified 08/28/2022 No Yes severity Z89.512 Acquired absence of left leg below knee 08/28/2022 No Yes E11.51 Type 2 diabetes mellitus  with diabetic peripheral angiopathy without gangrene 08/28/2022 No Yes L03.116 Cellulitis of left lower limb 08/28/2022 No Yes Pharo, Traquan L ([id number]) [id number] Page 5 of 8 Inactive Problems Resolved Problems Electronic Signature(s) Signed: 10/14/2022 4:30:46 PM By: Linton Ham MD Entered By: Linton Ham on 10/14/2022 10:32:29 -------------------------------------------------------------------------------- Progress Note Details Patient Name: Date of Service: [name], [name] MES L. 10/14/2022 10:15 A M Medical Record Number: [id number] Patient Account Number: [id number] Date of Birth/Sex: Treating RN: 07/22/1966 (56 y.o. [name] Primary Care Provider: [name] Other Clinician: Referring Provider: Treating Provider/Extender: RO BSO N, MICHA EL [place] in Treatment: 6 Subjective History of Present Illness (HPI) 10/31/2021 patient presents today for evaluation here in our clinic. He is actually being evaluated for hyperbaric oxygen therapy only. He has been seen at [facility] up to this point he is under the care of the vascular surgery specialty. Subsequently he is also under the care of the hyperbaric center there. With that being said that due to the fact that he is a dialysis patient he was actually recommended to be transferred to [place] for his treatments he actually lives in [place] which is  where his dialysis is. It was a hardship for him to try to get from [place] especially on dialysis days all the way to [facility] get into the facility for his treatment and get back home he was literally spending all day long. He has had just 4 treatments there at [facility] thus far based on what I see in these have not been continued while he awaits approval here in our clinic. Subsequently I do have records for review that will be included in the HPI predetermination review and attached to this chart as well. I will not duplicate  that here. 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 Nonetheless he continues to have exposed bone with marked soft tissue swelling compatible with osteomyelitis. This is due to the irregularity of the head of the metatarsal as well. Nonetheless along with having associated cellulitis he is also good to be seen by infectious disease and is currently on antibiotics for this as well. Again that will be detailed in the pretreatment review section which I will attach to this note as well. He does currently have a wound VAC in place and again the wound was not evaluated here in the clinic as that is being completely managed by home health and [facility]. The patient does have a history of diabetes mellitus type 2, hypertension, coronary artery disease, and congestive heart failure. He also has cataracts of both eyes but no evidence of glaucoma on his most recent eye exam. 11/22/2021 we have been seeing this gentleman for hyperbaric oxygen therapy but have not actually evaluated his wound up to this point this was being managed by the wound care and vascular team I presumed at [facility] at least that is what has been told to be previous. Nonetheless at this point I got a call from Atlee Abide who is a Librarian, academic at the [facility] vascular clinic with Dr. Durene Fruits and subsequently they were wanting to know if I could take over wound care for this patient. Apparently they do not have the ability for an office debridement which again I completely understand but nonetheless definitely is not an integral part of his healing along with the hyperbarics. Subsequently I discussed with the patient today that I do believe he would benefit going ahead and proceeding with evaluation here in the clinic for that reason I did go ahead and see him today to get things  started. There is also been some confusion about the issues with his home health nurse and getting measurements to [facility] for the wound VAC in order to continue his wound VAC therapy. With that being said after I see him today we will make a determination on what to do with wound VAC we can definitely send measurements to [facility] but again the bigger question is whether this is the best way to go or not. 11/28/2021 upon evaluation today patient appears to be doing decently well in regard to his wound. He has been tolerating the dressing changes with the Dakin's moistened gauze dressing which I think is doing a good job. Fortunately I do not see any signs of active infection locally nor systemically at this point. In general I think that he is actually making excellent progress which is great news. 2/17; seen in conjunction with HBO today. He is using Dakin's moistened gauze in the major TMA  site wound on the left. Much improved condition of the wound surface. On the lateral foot and eschared area that he has been using Betadine. T olerating HBO well 12/13/2021 upon evaluation today patient appears to be doing pretty well in regard to his foot ulcer. I am actually much more pleased currently that I been with the way the appearance looks today. The wound bed does show some signs of slough and biofilm on the surface of the wound this is minimal I Minna perform debridement the I will do so very carefully due to the fact that to be honest the patient had a significant amount of bleeding in the past being on the Brilinta he had a difficult time clotting when I debrided him previously quite significantly. Fortunately I do not see any signs of active infection at this time which is great news. No fevers, chills, nausea, vomiting, or diarrhea. 12/20/2021 upon evaluation today patient actually appears to be showing some signs of excellent improvement here. I am very pleased with where things stand currently. There does not  appear to be any evidence of active infection locally nor systemically which is great news and I do believe that with the hyperbaric oxygen therapy he is actually making excellent progress. 3/9; patient presents for follow-up. He continues to do HBO therapy without issues. He has completed 34 out of 40 sessions. He continues to use Dakin's wet- to-dry to the amputation site and Betadine to the lateral foot wound. he currently denies signs of infection. 01/03/2022 upon evaluation today patient appears to be doing better in regard to his distal foot ulcer. Unfortunately the lateral foot is a different story this area Grafton, [place] L ([id number]) [id number] Page 6 of 8 has softened up and is getting need to be debrided away today. I Georgina Peer do this with scissors and forceps and was able to clear that out that will be detailed below. Otherwise I feel like the Dakin's moistened gauze dressing is doing an awesome job for him and he is making significantly improvements here. 01/10/2022 upon evaluation today patient continues to have some of the necrotic tissue loosen up on the lateral portion of his foot the main wound that we have been treating is actually doing quite well. Overall very pleased with where things stand in general. No fevers, chills, nausea, vomiting, or diarrhea. 01/17/2022 upon evaluation today patient appears to be doing somewhat poorly in regard to the lateral foot wound. The main wound that we initially were seeing him for looks to be doing great. This lateral foot has worsened and unfortunately it appears to potentially have some signs of infection here. I do believe that we may need to see about getting started on some IV antibiotics he is in dialysis so they could potentially do this for him, Monday. Nonetheless I want to try to get that started ASAP. Again I would prefer him be initiated with IV vancomycin and cefepime just as an empiric broad-spectrum until we  can get the culture results back at least. Subsequently also think that this should be done sooner rather than later due to the fact that his leg is more swollen and overall this seems to have progressed fairly rapidly since last week I do not want this to get worse. He voiced understanding. 01-24-2022 upon evaluation today patient appears to be doing well with regard to his main ulcer that we have been seeing him for. I am very pleased with where things stand currently. He still continues to have  bleeding if we do any type of aggressive sharp debridement there for him having to be very careful using Dakin's solution along with debridement as I can here in the office. Overall the patient seems to be doing well. Unfortunately the lateral foot wound still has progressed a little bit further. We did get him on antibiotics starting this past Monday I think the drainage looks much better and overall I am seeing improvement but again this was already broken down to a certain degree I am hopeful by next week this will really start turnaround for [place]. We will also get a send him for an x-ray to evaluate for any signs of osteomyelitis on this lateral foot. 01-31-2022 upon evaluation today patient's wound on the medial left amputation site is actually doing excellent. I am very pleased with where things stand this is significantly smaller and we are headed in the right direction. In regard to the left lateral foot there is still some necrotic tissue to be cleared away although I see evidence of this starting to basically solidify as far as the edges are concerned with how far it spread I do not see anything seeming to want to spread further. I believe this may be close to done as far as any evolving is concerned. 02-07-2022 upon evaluation today patient appears to be doing well with regard to the main amputation site. Unfortunately the secondary site on the left lateral foot is still giving him some trouble here.  There is a lot of dried tissue and I do believe that along with the Dakin's we may want to use a contact layer in the base of the wound to try to prevent the bone from drying out because we are down to that point at this time. The patient voiced understanding at this point as well and is happy to do what ever it takes to try to get this better. With that being said I do believe that it is possible he may benefit from a wound VAC. Again the question is the approval process for getting this done obviously we can see what we can do if we get things cleaned a little bit better but right now I think the best option is going to be for [place] to continue with the packing currently. 02-14-2022 upon evaluation today patient presents for follow-up concerning ongoing issues with his foot. He did have the MRI which does show that he has evidence of osteomyelitis of the digit at the fifth location of his foot. Subsequently this does extend down to the base of the digit and into the proximal portion of the metatarsal as well. With that being said the bone does not appear to be too severely broken down and a lot of the marrow signal is stated to be maintained which is good news. Nonetheless I do believe that he is unfortunately still in a spot where this is precarious and we need to make sure that we are treating this appropriately. He does seem to be doing well with IV antibiotics he also seems to be doing quite well when it comes to the overall appearance of the wound bed at this point. I am happy in that regard. 02-21-2022 upon evaluation today patient appears to be doing well all things considered in regard to his wounds. I do feel like that he is making some progress here. The wound on the right distal foot where the amputation site is is actually healing nicely I feel like we can switch  over to collagen at this point that should do well for him. In regard to the left lateral foot this is actually getting much cleaner  he still has some area where there seems to be some pressure getting to the location I think that he would do well with a knee scooter at this point. I discussed that with him he does not seem prone to going that route at this point he understands it can definitely help. 02-28-2022 upon evaluation today patient appears to be doing well currently with regard to the wound on his foot. The main surgical wound is actually significantly improved and the lateral wound is doing better there is still bone exposed but nonetheless this does not appear to be significantly worse than what we have noted previous. In fact I think it looks better than it did last week. My goal is to keep the bone from drying out and subsequently for that reason we are going to go ahead and proceed with the EpiFix which I think is good to be a good option here for him. Over top of the EpiFix we will do a contact layer in order to hopefully keep things from drying out. This dressing will actually stay in place until he comes back. Patient did see vascular yesterday and states that they did not feel like anything was doing worse still it appears that if it were left up to them they think the ideal thing would be to proceed with an amputation. I explained to the patient that to be honest is not always avoidable but if we can try to avoid amputation that is what we are after. He voiced understanding and he does want to do what ever he can to try to prevent amputation for that reason we are proceeding with the epi cord today. This will be application #1. 12-09-2540 upon evaluation today patient appears to be making some progress here regard to the lateral foot. Fortunately there does not appear to be any signs of active infection locally or systemically which is great news. No fevers, chills, nausea, vomiting, or diarrhea. I do think that he is appropriate for second application of EpiCord today. 03-14-2022 upon evaluation today patient's  wound still has significant exposure of the bone at this point. He did get crutches he has been trying to use those the past couple of days am not sure that is going so well to be perfectly honest. We did discuss however offloading is important and we even discussed a total contact cast but to be perfectly honest right now and feels like he may need more debridement to clear away some of the bone which is not viable in the end of the foot of the fifth metatarsal but I am unable to really do that here in the office due to his bleeding. I do not know if potentially we could have him seen by podiatry potentially Dr. Merril Abbe to see if there is anything he can do to help in this regard. I am not even certain whether the majority of the metatarsal is even viable to be honest. Either way I do think that he would benefit from surgical debridement or even possibly partial ray amputation in order for [place] to try to keep things moving in the right direction and get this closed. This was all discussed with the patient today. With that being said I know he wants to avoid a below-knee amputation which is what has previously been recommended. 03-21-2022 upon evaluation  today patient appears to be doing well in regard to the medial wound at this is shown signs of excellent improvement. In regard to the lateral wound this is a bit dry compared to what I want to see him Regino Schultze not draining as much but I think that we need to see what we can do about trying to keep this from drying out and keeping it a bit more moist. Patient voiced understanding. Overall I am very pleased otherwise with where we stand. 03-27-2022 upon evaluation today patient appears to be doing well currently in regard to his wound especially on the medial aspect this looks really good the lateral aspect is looking a little bit better to with some healthier appearance to the bone compared to last week to be honest. Fortunately I do not see any signs of  active infection locally or systemically at this time which is great news. No fevers, chills, nausea, vomiting, or diarrhea. READMISSION 08/28/2022 This is a 56 year old man who is a diabetic on dialysis but not currently on any treatment for it. He has a known history of PAD. He was in this clinic for a protracted period of time this year for a left foot wound with underlying osteomyelitis for which she was treated with hyperbaric oxygen. As I understand things he left the clinic in June with wounds and an unhealed state possibly infected. 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 Unfortunately in September he required a revision of the BKA but he was ultimately admitted to [facility] for a protracted period with bacteremia secondary to group A strep from 9/12-10/12. Marland Kitchen He is recently completed vancomycin he was taking at dialysis He has been left with a nonhealing surgical wound on his left BKA amputation site. He recently followed with vascular surgery who recommended Santyl and Vashe solution which they have been applying to the wound every day. It is noted that he does have severe PAD in the right lower extremity that is status post transmetatarsal amputation he is going to follow him up in 3 months. He is also seen infectious disease at [facility] who state that he had MRSA treated with cefazolin and vancomycin at hemodialysis. However he is listed as having an allergic reaction to cefazolin including lip swelling therefore he completed vancomycin. He is not felt to require any additional antibiotics. 09-04-2022 upon evaluation today patient appears to be doing well currently in regard to his wound on the medial aspect of this is amputation site where he had a BKA. On the lateral aspect however he has an area of the incision line that is open that actually goes somewhat deep. We did probe about 4 cm down Perrow, Tanvir L ([id number])  [id number] Page 7 of 8 although I do not feel any bone directly. Fortunately there does not appear to be any signs of active infection locally or systemically at this time. There is some concern here in my estimation about the possibility of infection and I think that he may need some extension of the antibiotics I Georgina Peer probably put him on linezolid as he was previously on vancomycin. 09-16-2022 upon evaluation today patient appears to be doing well currently in regard to his wound. There is good to be some need for sharp debridement today but fortunately nothing too significant. Fortunately I do not see any signs of active infection locally nor systemically at this time which is  great news. No fevers, chills, nausea, vomiting, or diarrhea. 09-23-2022 upon evaluation today patient appears to be doing well currently in regard to his wounds that were still in the cleaning up phase in regard to the primary wound where he dehisced. There is a lot of necrotic tissue that is can have to be cleaned out working on that at this point. 09-30-2022 upon evaluation today patient appears to be doing well currently in regard to his wounds. He is slowly making progress and getting the main open area on the medial aspect of his incision line in a much healthier state. In general I feel like that we are headed in the right direction. I do not see any signs of active infection at this time. 10-07-2022 upon evaluation today patient appears to be doing well currently in regard to his wound. He has been tolerating the dressing changes without complication of the lateral portion of the wound actually is closed the more medial portion is doing much better the swelling overall is excellent. He has been using the Tubigrip as a compression on the end of the stump and that has done extremely well for him. 12/26; unfortunately the patient's wound on the left BKA stump has a deeper area from 6-9  o'clock. There is not been any drainage she is not complaining of any pain. He states that he has been up in his wheelchair with his stump dependent quite a bit. This might account for why the edema in the stump seems out of control. Objective Constitutional Patient is hypotensive.. Pulse regular and within target range for patient.Marland Kitchen Respirations regular, non-labored and within target range.. Temperature is normal and within the target range for the patient.Marland Kitchen appears in no distress. Vitals Time Taken: 10:16 AM, Height: 70 in, Weight: 166 lbs, BMI: 23.8, Temperature: 98.1 F, Pulse: 66 bpm, Respiratory Rate: 18 breaths/min, Blood Pressure: 163/103 mmHg. General Notes: Wound exam; left stump. The open areas on the medial aspect. Initially the surface looks quite healthy however from of 6-9 o'clock there is about a 1.5 cm tunnel. This is worse than what was measured last week. The rest of the surface of the wound does not look unhealthy there is no surrounding tenderness no purulence no drainage. However there is poorly controlled swelling around the stump incision itself Integumentary (Hair, Skin) Wound #3 status is Open. Original cause of wound was Surgical Injury. The date acquired was: 07/19/2022. The wound has been in treatment 6 weeks. 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 The wound measures 1.7cm length x 0.8cm width x 1.6cm depth; 1.068cm^2 area and 1.709cm^3 volume. There is Fat Layer (Subcutaneous Tissue) exposed. There is no tunneling or undermining noted. There is a medium amount of serosanguineous drainage noted. There is medium (34-66%) pink granulation within the wound bed. There is a medium (34-66%) amount of necrotic tissue within the wound bed including Adherent Slough. 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 Home Health: [facility] for wound care. May utilize formulary equivalent dressing for wound treatment orders unless otherwise specified. Home Health Nurse may visit PRN to address patientoos wound care needs. Jackquline Denmark [phone] Bathing/ Shower/ Hygiene: May shower; gently cleanse wound with antibacterial soap, rinse and pat dry prior  to dressing wounds Edema Control - Lymphedema / Segmental Compressive Device / Other: Tubigrip double layer applied - to stump and above knee WOUND #3: - Amputation Site - Below Knee Wound Laterality: Left Prim Dressing: Silvercel 4 1/4x 4 1/4 (in/in) 1 x Per Day/30 Days ary Discharge Instructions: Apply Silvercel 4 1/4x 4 1/4 (in/in) as instructed Secondary Dressing: Gauze 1 x Per Day/30 Days Secured With: Medipore T - 50M Medipore H Soft Cloth Surgical T ape ape, 2x2 (in/yd) 1 x Per Day/30 Days Secured With: Tubigrip Size D, 3x10 (in/yd) 1 x Per Day/30 Days Shaler, Dashiell L ([id number]) [id number] Page 8 of 8 Discharge Instructions: double layer to thigh 1. I put the Santyl on hold for now packing the small tunneled area with a strip of silver alginate and then layering silver alginate over the top. 2. He is using Tubigrip's on the stump itself [double Tubigrip's]. I asked him to keep the stump elevated 3. I saw no evidence of infection no cultures were done Electronic Signature(s) Signed: 10/14/2022 4:30:46 PM By: Linton Ham MD Entered By: Linton Ham on 10/14/2022 10:41:17 -------------------------------------------------------------------------------- SuperBill Details Patient Name: Date of Service: [name], [place] MES L. 10/14/2022 Medical Record Number: [id number] Patient Account Number: [id number] Date of Birth/Sex: Treating RN: 20-Jan-1966 (56 y.o. [name]) [name] Primary Care Provider: [name] Other Clinician: Referring Provider: Treating Provider/Extender: RO BSO N, Delph[place] EL [place] in Treatment: 6 Diagnosis Coding ICD-10 Codes Code Description [phone] Non-pressure chronic ulcer of other part of left lower leg with other specified severity Z89.512 Acquired absence of left leg below knee E11.51 Type 2 diabetes mellitus with diabetic peripheral angiopathy without gangrene L03.116 Cellulitis of left lower limb Facility Procedures : CPT4 Code: [id number] Description: 19166 - WOUND CARE VISIT-LEV 2 EST PT Modifier: Quantity: 1 Physician Procedures : CPT4 Code Description Modifier [id number] - WC PHYS LEVEL 3 - EST PT ICD-10 Diagnosis Description L97.828 Non-pressure chronic ulcer of other part of left lower leg with other specified severity Z89.512 Acquired absence of left leg below knee Quantity: 1 Electronic Signature(s) Signed: 10/14/2022 4:30:46 PM By: Linton Ham MD Entered By: Linton Ham on 10/14/2022 10:41:45

## 2022-10-14 NOTE — Progress Notes (Signed)
[address] [place] ([id number]) [id number] Page 1 of 9 Visit Report for 10/14/2022 Arrival Information Details Patient Name: Date of Service: [name] MES L. 10/14/2022 10:15 A M Medical Record Number: [id number] Patient Account Number: [id number] Date of Birth/Sex: Treating RN: 1965/11/18 (56 y.o. [name]) [name] Primary Care Latalia Etzler: [name] Other Clinician: Referring Joy Haegele: Treating Cashius Grandstaff/Extender: RO BSO N, [place] in Treatment: 6 Visit Information History Since Last Visit Added or deleted any medications: No Patient Arrived: Wheel Chair Any new allergies or adverse reactions: No Arrival Time: 10:10 Had a fall or experienced change in No Accompanied By: wife activities of daily living that may affect Transfer Assistance: None risk of falls: Patient Identification Verified: Yes Signs or symptoms of abuse/neglect since last visito No Secondary Verification Process Completed: Yes Hospitalized since last visit: No Patient Requires Transmission-Based Precautions: No Implantable device outside of the clinic excluding No Patient Has Alerts: No cellular tissue based products placed in the center since last visit: Has Dressing in Place as Prescribed: Yes Has Compression in Place as Prescribed: Yes Pain Present Now: No Electronic Signature(s) Signed: 10/14/2022 4:50:51 PM By: [name] RN Entered By: [name] on 10/14/2022 10:15:31 -------------------------------------------------------------------------------- Clinic Level of Care Assessment Details Patient Name: Date of Service: [name] MES L. 10/14/2022 10:15 A M Medical Record Number: [id number] Patient Account Number: [id number] Date of Birth/Sex: Treating RN: 1966-08-26 (56 y.o. [name]) [name] Primary Care Ilea Hilton: [name] Other Clinician: Referring Jovanni Eckhart: Treating Beverely Suen/Extender: RO BSO N, MICHA EL [place] in Treatment:  6 Clinic Level of Care Assessment Items TOOL 4 Quantity Score X- 1 0 Use when only an EandM is performed on FOLLOW-UP visit ASSESSMENTS - Nursing Assessment / Reassessment X- 1 10 Reassessment of Co-morbidities (includes updates in patient status) X- 1 5 Reassessment of Adherence to Treatment Plan JAVARIE, CRISP ([id number]) [id number] Page 2 of 9 ASSESSMENTS - Wound and Skin A ssessment / Reassessment X - Simple Wound Assessment / Reassessment - one wound 1 5 []  - 0 Complex Wound Assessment / Reassessment - multiple wounds []  - 0 Dermatologic / Skin Assessment (not related to wound area) ASSESSMENTS - Focused Assessment []  - 0 Circumferential Edema Measurements - multi extremities []  - 0 Nutritional Assessment / Counseling / Intervention []  - 0 Lower Extremity Assessment (monofilament, tuning fork, pulses) []  - 0 Peripheral Arterial Disease Assessment (using hand held doppler) ASSESSMENTS - Ostomy and/or Continence Assessment and Care []  - 0 Incontinence Assessment and Management []  - 0 Ostomy Care Assessment and Management (repouching, etc.) PROCESS - Coordination of Care X - Simple Patient / Family Education for ongoing care 1 15 []  - 0 Complex (extensive) Patient / Family Education for ongoing care []  - 0 Staff obtains Programmer, systems, Records, T Results / Process Orders est []  - 0 Staff telephones HHA, Nursing Homes / Clarify orders / etc []  - 0 Routine Transfer to another Facility (non-emergent condition) []  - 0 Routine Hospital Admission (non-emergent condition) []  - 0 New Admissions / Biomedical engineer / Ordering NPWT Apligraf, etc. , []  - 0 Emergency Hospital Admission (emergent condition) X- 1 10 Simple Discharge Coordination []  - 0 Complex (extensive) Discharge Coordination PROCESS - Special Needs []  - 0 Pediatric / Minor Patient Management []  - 0 Isolation Patient Management []  - 0 Hearing / Language / Visual special  needs []  - 0 Assessment of Community assistance (transportation, D/C planning, etc.) []  - 0 Additional assistance / Altered mentation []  -  0 Support Surface(s) Assessment (bed, cushion, seat, etc.) INTERVENTIONS - Wound Cleansing / Measurement X - Simple Wound Cleansing - one wound 1 5 []  - 0 Complex Wound Cleansing - multiple wounds X- 1 5 Wound Imaging (photographs - any number of wounds) []  - 0 Wound Tracing (instead of photographs) X- 1 5 Simple Wound Measurement - one wound []  - 0 Complex Wound Measurement - multiple wounds INTERVENTIONS - Wound Dressings X - Small Wound Dressing one or multiple wounds 1 10 []  - 0 Medium Wound Dressing one or multiple wounds []  - 0 Large Wound Dressing one or multiple wounds []  - 0 Application of Medications - topical []  - 0 Application of Medications - injection INTERVENTIONS - Miscellaneous []  - 0 External ear exam Huish, Benicio L ([id number]) [id number] Page 3 of 9 []  - 0 Specimen Collection (cultures, biopsies, blood, body fluids, etc.) []  - 0 Specimen(s) / Culture(s) sent or taken to Lab for analysis []  - 0 Patient Transfer (multiple staff / [name] Lift / Similar devices) []  - 0 Simple Staple / Suture removal (25 or less) []  - 0 Complex Staple / Suture removal (26 or more) []  - 0 Hypo / Hyperglycemic Management (close monitor of Blood Glucose) []  - 0 Ankle / Brachial Index (ABI) - do not check if billed separately X- 1 5 Vital Signs Has the patient been seen at the hospital within the last three years: Yes Total Score: 75 Level Of Care: New/Established - Level 2 Electronic Signature(s) Signed: 10/14/2022 4:50:51 PM By: [name] RN Entered By: [name] on 10/14/2022 10:30:49 -------------------------------------------------------------------------------- Encounter Discharge Information Details Patient Name: Date of Service: [name] MES L. 10/14/2022 10:15 A M Medical Record Number:  [id number] Patient Account Number: [id number] Date of Birth/Sex: Treating RN: [birth year] (56 y.o. [name] Primary Care Preston Garabedian: [name] Other Clinician: Referring Saqib Cazarez: Treating Hale Chalfin/Extender: RO BSO N, MICHA EL [place] in Treatment: 6 Encounter Discharge Information Items Discharge Condition: Stable Ambulatory Status: Wheelchair Discharge Destination: Home Transportation: Private Auto Accompanied By: wife Schedule Follow-up Appointment: Yes Clinical Summary of Care: Electronic Signature(s) Signed: 10/14/2022 4:50:51 PM By: [name] RN Entered By: [name] on 10/14/2022 10:31:58 Lower Extremity Assessment Details -------------------------------------------------------------------------------- Valla Leaver ([id number]) [id number] Page 4 of 9 Patient Name: Date of Service: [name] MES L. 10/14/2022 10:15 A M Medical Record Number: [id number] Patient Account Number: [id number] Date of Birth/Sex: Treating RN: [birth year] (56 y.o. [name]) [name] Primary Care Imri Lor: [name] Other Clinician: Referring Stephanie Mcglone: Treating Christne Platts/Extender: RO BSO N, MICHA EL [place] in Treatment: 6 Electronic Signature(s) Signed: 10/14/2022 4:50:51 PM By: [name] RN Entered By: [name] on 10/14/2022 10:22:59 -------------------------------------------------------------------------------- Multi Wound Chart Details Patient Name: Date of Service: [name] MES L. 10/14/2022 10:15 A M Medical Record Number: [id number] Patient Account Number: [id number] Date of Birth/Sex: Treating RN: 1966-07-25 (56 y.o. [name]) [name] Primary Care Amber Guthridge: [name] Other Clinician: Referring Glanda Spanbauer: Treating Yazmina Pareja/Extender: RO BSO N, MICHA [place] in Treatment: 6 Vital Signs Height(in): 70 Pulse(bpm): 37 Weight(lbs): 166 Blood Pressure(mmHg): 163/103 Body Mass Index(BMI):  23.8 Temperature(F): 98.1 Respiratory Rate(breaths/min): 18 [3:Photos:] [N/A:N/A] Left Amputation Site - Below Knee N/A N/A Wound Location: Surgical Injury N/A N/A Wounding Event: Dehisced Wound N/A N/A Primary Etiology: Cataracts, Anemia, Sleep Apnea, N/A N/A Comorbid History: Arrhythmia, Congestive Heart Failure, Coronary Artery Disease, Hypertension, Myocardial Infarction, Type II Diabetes, Neuropathy, Seizure Disorder 07/19/2022 N/A N/A Date Acquired: 6  N/A N/A Weeks of Treatment: Open N/A N/A Wound Status: No N/A N/A Wound Recurrence: 1.7x0.8x1.6 N/A N/A Measurements L x W x D (cm) 1.068 N/A N/A A (cm) : rea 1.709 N/A N/A Volume (cm) : 92.10% N/A N/A % Reduction in Area: 57.90% N/A N/A % Reduction in Volume: Full Thickness Without Exposed N/A N/A Classification: Support Structures Medium N/A N/A Exudate Amount: Serosanguineous N/A N/A Exudate Type: red, brown N/A N/A Exudate Color: Medium (34-66%) N/A N/A Granulation Amount: Pink N/A N/A Granulation Quality: [place], Camron L ([id number]) [id number] Page 5 of 9 Medium (34-66%) N/A N/A Necrotic Amount: Fat Layer (Subcutaneous Tissue): Yes N/A N/A Exposed Structures: Fascia: No Tendon: No Muscle: No Joint: No Bone: No None N/A N/A Epithelialization: Treatment Notes Electronic Signature(s) Signed: 10/14/2022 4:50:51 PM By: [name] RN Entered By: [name] on 10/14/2022 10:23:03 -------------------------------------------------------------------------------- [place] Details Patient Name: Date of Service: [name] MES L. 10/14/2022 10:15 A M Medical Record Number: [id number] Patient Account Number: [id number] Date of Birth/Sex: Treating RN: [birth year] (56 y.o. [name]) [name] Primary Care Kinleigh Nault: [name] Other Clinician: Referring Glenora Morocho: Treating Christiano Blandon/Extender: RO BSO N, MICHA EL [place] in Treatment:  6 Active Inactive Necrotic Tissue Nursing Diagnoses: Knowledge deficit related to management of necrotic/devitalized tissue Goals: Necrotic/devitalized tissue will be minimized in the wound bed Date Initiated: 08/28/2022 Target Resolution Date: 10/17/2022 Goal Status: Active Interventions: Assess patient pain level pre-, during and post procedure and prior to discharge Notes: Nutrition Nursing Diagnoses: Impaired glucose control: actual or potential Goals: Patient/caregiver will maintain therapeutic glucose control Date Initiated: 08/28/2022 Target Resolution Date: 10/17/2022 Goal Status: Active Interventions: Assess HgA1c results as ordered upon admission and as needed Assess patient nutrition upon admission and as needed per policy Notes: Wound/Skin Impairment Nursing Diagnoses: Knowledge deficit related to ulceration/compromised skin integrity Zucker, [place] L ([id number]) [id number] Page 6 of 9 Goals: Patient/caregiver will verbalize understanding of skin care regimen Date Initiated: 08/28/2022 Target Resolution Date: 10/17/2022 Goal Status: Active Ulcer/skin breakdown will have a volume reduction of 30% by week 4 Date Initiated: 08/28/2022 Target Resolution Date: 10/17/2022 Goal Status: Active Ulcer/skin breakdown will have a volume reduction of 50% by week 8 Date Initiated: 08/28/2022 Target Resolution Date: 11/17/2022 Goal Status: Active Ulcer/skin breakdown will have a volume reduction of 80% by week 12 Date Initiated: 08/28/2022 Target Resolution Date: 12/18/2022 Goal Status: Active Ulcer/skin breakdown will heal within 14 weeks Date Initiated: 08/28/2022 Target Resolution Date: 01/16/2023 Goal Status: Active Interventions: Assess patient/caregiver ability to obtain necessary supplies Assess patient/caregiver ability to perform ulcer/skin care regimen upon admission and as needed Assess ulceration(s) every visit Notes: Electronic  Signature(s) Signed: 10/14/2022 4:50:51 PM By: [name] RN Entered By: [name] on 10/14/2022 10:31:10 -------------------------------------------------------------------------------- Pain Assessment Details Patient Name: Date of Service: [name] MES L. 10/14/2022 10:15 A M Medical Record Number: [id number] Patient Account Number: [id number] Date of Birth/Sex: Treating RN: 11/13/1965 (56 y.o. [name]) [name] Primary Care Klair Leising: [name] Other Clinician: Referring Daegan Arizmendi: Treating Sandra Tellefsen/Extender: RO BSO N, MICHA EL [place] in Treatment: 6 Active Problems Location of Pain Severity and Description of Pain Patient Has Paino Yes Site Locations With Dressing Change: Yes Duration of the Pain. Constant / Intermittento Intermittent Rate the pain. Current Pain Level: 5 Worst Pain Level: 7 Least Pain Level: 0 Tolerable Pain Level: 5 Character of Pain Winch, Joevon L ([id number]) [id number] Page 7 of 9 Describe the Pain: Burning, Throbbing Pain Management and Medication Current Pain  Management: Medication: Yes Cold Application: No Rest: Yes Massage: No Activity: No T.E.N.S.: No Heat Application: No Leg drop or elevation: No Is the Current Pain Management Adequate: Inadequate How does your wound impact your activities of daily livingo Sleep: Yes Bathing: No Appetite: No Relationship With Others: No Bladder Continence: No Emotions: No Bowel Continence: No Work: No Toileting: No Drive: No Dressing: No Hobbies: No Electronic Signature(s) Signed: 10/14/2022 4:50:51 PM By: [name] RN Entered By: [name] on 10/14/2022 10:16:49 -------------------------------------------------------------------------------- Patient/Caregiver Education Details Patient Name: Date of Service: [name] MES L. 12/26/2023andnbsp10:15 [place] Record Number: [id number] Patient Account Number: [id number] Date of Birth/Gender:  Treating RN: May 25, 1966 (56 y.o. [name] Primary Care Physician: [name] Other Clinician: Referring Physician: Treating Physician/Extender: RO BSO N, MICHA EL [place] in Treatment: 6 Education Assessment Education Provided To: Patient Education Topics Provided Wound/Skin Impairment: Methods: Explain/Verbal Responses: State content correctly Electronic Signature(s) Signed: 10/14/2022 4:50:51 PM By: [name] RN Entered By: [name] on 10/14/2022 10:31:05 [place], Joyice Faster ([id number]) [id number] Page 8 of 9 -------------------------------------------------------------------------------- Wound Assessment Details Patient Name: Date of Service: [name] MES L. 10/14/2022 10:15 A M Medical Record Number: [id number] Patient Account Number: [id number] Date of Birth/Sex: Treating RN: 1966-07-13 (56 y.o. [name]) [name] Primary Care Mayur Duman: [name] Other Clinician: Referring Amarachukwu Lakatos: Treating Stamatia Masri/Extender: RO BSO N, MICHA EL [place] in Treatment: 6 Wound Status Wound Number: 3 Primary Dehisced Wound Etiology: Wound Location: Left Amputation Site - Below Knee Wound Open Wounding Event: Surgical Injury Status: Date Acquired: 07/19/2022 Comorbid Cataracts, Anemia, Sleep Apnea, Arrhythmia, Congestive Heart Weeks Of Treatment: 6 History: Failure, Coronary Artery Disease, Hypertension, Myocardial Clustered Wound: No Infarction, Type II Diabetes, Neuropathy, Seizure Disorder Photos Wound Measurements Length: (cm) 1.7 Width: (cm) 0.8 Depth: (cm) 1.6 Area: (cm) 1.068 Volume: (cm) 1.709 % Reduction in Area: 92.1% % Reduction in Volume: 57.9% Epithelialization: None Tunneling: No Undermining: No Wound Description Classification: Full Thickness Without Exposed Suppor Exudate Amount: Medium Exudate Type: Serosanguineous Exudate Color: red, brown t Structures Foul Odor After Cleansing:  No Slough/Fibrino Yes Wound Bed Granulation Amount: Medium (34-66%) Exposed Structure Granulation Quality: Pink Fascia Exposed: No Necrotic Amount: Medium (34-66%) Fat Layer (Subcutaneous Tissue) Exposed: Yes Necrotic Quality: Adherent Slough Tendon Exposed: No Muscle Exposed: No Joint Exposed: No Bone Exposed: No Treatment Notes Wound #3 (Amputation Site - Below Knee) Wound Laterality: Left [place] Peri-Wound Care Topical Swenson, Brylon L ([id number]) [id number] Page 9 of 9 Primary Dressing Silvercel 4 1/4x 4 1/4 (in/in) Discharge Instruction: Apply [place] 4 1/4x 4 1/4 (in/in) as instructed Secondary Dressing Gauze Secured With [place] H Soft Cloth Surgical T ape ape, 2x2 (in/yd) Tubigrip Size D, 3x10 (in/yd) Discharge Instruction: double layer to thigh Compression Wrap Compression Stockings Add-Ons Electronic Signature(s) Signed: 10/14/2022 4:50:51 PM By: [name] RN Entered By: [name] on 10/14/2022 10:22:46 -------------------------------------------------------------------------------- [place] Details Patient Name: Date of Service: [name] MES L. 10/14/2022 10:15 A M Medical Record Number: [id number] Patient Account Number: [id number] Date of Birth/Sex: Treating RN: Mar 06, 1966 (56 y.o. [name]) [name] Primary Care Laporche Martelle: [name] Other Clinician: Referring Siobahn Worsley: Treating Avram Danielson/Extender: RO BSO N, MICHA EL [place] in Treatment: 6 Vital Signs Time Taken: 10:16 Temperature (F): 98.1 Height (in): 70 Pulse (bpm): 66 Weight (lbs): 166 Respiratory Rate (breaths/min): 18 Body Mass Index (BMI): 23.8 Blood Pressure (mmHg): 163/103 Reference Range: 80 - 120 mg / dl Electronic Signature(s)  Signed: 10/14/2022 4:50:51 PM By: [name] RN Entered By: [name] on 10/14/2022 10:16:08

## 2022-10-15 DIAGNOSIS — N2581 Secondary hyperparathyroidism of renal origin: Secondary | ICD-10-CM | POA: Diagnosis not present

## 2022-10-15 DIAGNOSIS — Z992 Dependence on renal dialysis: Secondary | ICD-10-CM | POA: Diagnosis not present

## 2022-10-15 DIAGNOSIS — I96 Gangrene, not elsewhere classified: Secondary | ICD-10-CM | POA: Diagnosis not present

## 2022-10-15 DIAGNOSIS — D631 Anemia in chronic kidney disease: Secondary | ICD-10-CM | POA: Diagnosis not present

## 2022-10-15 DIAGNOSIS — N186 End stage renal disease: Secondary | ICD-10-CM | POA: Diagnosis not present

## 2022-10-17 DIAGNOSIS — Z992 Dependence on renal dialysis: Secondary | ICD-10-CM | POA: Diagnosis not present

## 2022-10-17 DIAGNOSIS — N2581 Secondary hyperparathyroidism of renal origin: Secondary | ICD-10-CM | POA: Diagnosis not present

## 2022-10-17 DIAGNOSIS — I96 Gangrene, not elsewhere classified: Secondary | ICD-10-CM | POA: Diagnosis not present

## 2022-10-17 DIAGNOSIS — N186 End stage renal disease: Secondary | ICD-10-CM | POA: Diagnosis not present

## 2022-10-17 DIAGNOSIS — D631 Anemia in chronic kidney disease: Secondary | ICD-10-CM | POA: Diagnosis not present

## 2022-10-19 DIAGNOSIS — D631 Anemia in chronic kidney disease: Secondary | ICD-10-CM | POA: Diagnosis not present

## 2022-10-19 DIAGNOSIS — I96 Gangrene, not elsewhere classified: Secondary | ICD-10-CM | POA: Diagnosis not present

## 2022-10-19 DIAGNOSIS — N186 End stage renal disease: Secondary | ICD-10-CM | POA: Diagnosis not present

## 2022-10-19 DIAGNOSIS — N2581 Secondary hyperparathyroidism of renal origin: Secondary | ICD-10-CM | POA: Diagnosis not present

## 2022-10-19 DIAGNOSIS — Z992 Dependence on renal dialysis: Secondary | ICD-10-CM | POA: Diagnosis not present

## 2022-10-21 ENCOUNTER — Ambulatory Visit: Payer: Self-pay | Admitting: Licensed Clinical Social Worker

## 2022-10-21 NOTE — Patient Outreach (Signed)
  Care Coordination   Follow Up Visit Note   10/21/2022 Name: LAKE CINQUEMANI MRN: [id number] DOB: Dec 28, 1965  Valla Leaver is a 57 y.o. year old male who sees [name], MD for primary care. I spoke with  Valla Leaver by phone today.  What matters to the patients health and wellness today?  Wishes to go in and out of his house via use of ramp; he wishes to go up and down stairs in his home with use of stair lift. He wishes to complete ADLs daily as needed.    Goals Addressed               This Visit's Progress     Patient will be able to get in and out of his home and get up and down stairs. Patient will complete ADLs as needed. (pt-stated)        Care Coordination Interventions:  Discussed client needs Provided counseling support.  Discussed wound care. He said he has wound care nurse that  visits him at his home 3 times weekly to care for wound area of client.  Discussed uses of ramp to go in and out of home. Discussed use of stair lift to help him go up and down stairs Discussed RN support services with Care Coordination program    Reviewed medication procurement.  Discussed dialysis treatments received weekly. Encouraged client to call LCSW as needed for SW support at phone number [phone].       SDOH assessments and interventions completed:  Yes  SDOH Interventions Today    Flowsheet Row Most Recent Value  SDOH Interventions   Depression Interventions/Treatment  Counseling  Physical Activity Interventions Other (Comments)  [client uses a wheelchair to ambulate]  Stress Interventions Provide Counseling        Care Coordination Interventions:  Yes, provided   Follow up plan: Follow up call scheduled for 11/17/22 at 2:30 PM    Encounter Outcome:  Pt. Visit Completed   Norva Riffle.Sonya Gunnoe MSW, [place] Holiday representative [facility] Care Management [phone]

## 2022-10-21 NOTE — Patient Instructions (Signed)
Visit Information  Thank you for taking time to visit with me today. Please don't hesitate to contact me if I can be of assistance to you before our next scheduled telephone appointment.  Following are the goals we discussed today:   Our next appointment is by telephone on 11/17/22 at 2:30 PM   Please call the care guide team at [phone] if you need to cancel or reschedule your appointment.   If you are experiencing a Mental Health or [place] or need someone to talk to, please go to [facility] [place] [phone])   Following is a copy of your full plan of care:   Care Coordination Interventions:  Discussed client needs Provided counseling support.  Discussed wound care. He said he has wound care nurse that  visits him at his home 3 times weekly to care for wound area of client.  Discussed uses of ramp to go in and out of home. Discussed use of stair lift to help him go up and down stairs Discussed RN support services with Care Coordination program    Reviewed medication procurement.  Discussed dialysis treatments received weekly. Encouraged client to call LCSW as needed for SW support at phone number [phone]  Mr. Artola was given information about Care Management services by the embedded care coordination team including:  Care Management services include personalized support from designated clinical staff supervised by his physician, including individualized plan of care and coordination with other care providers 24/7 contact phone numbers for assistance for urgent and routine care needs. The patient may stop CCM services at any time (effective at the end of the month) by phone call to the office staff.  Patient agreed to services and verbal consent obtained.   Norva Riffle.Kataleah Bejar MSW, [place] Holiday representative [facility] Care Management [phone]

## 2022-10-23 ENCOUNTER — Encounter: Payer: Medicare Other | Attending: Physician Assistant | Admitting: Physician Assistant

## 2022-10-23 DIAGNOSIS — E11622 Type 2 diabetes mellitus with other skin ulcer: Secondary | ICD-10-CM | POA: Diagnosis present

## 2022-10-23 DIAGNOSIS — Z8614 Personal history of Methicillin resistant Staphylococcus aureus infection: Secondary | ICD-10-CM | POA: Insufficient documentation

## 2022-10-23 DIAGNOSIS — I509 Heart failure, unspecified: Secondary | ICD-10-CM | POA: Diagnosis not present

## 2022-10-23 DIAGNOSIS — Z8619 Personal history of other infectious and parasitic diseases: Secondary | ICD-10-CM | POA: Insufficient documentation

## 2022-10-23 DIAGNOSIS — Z89512 Acquired absence of left leg below knee: Secondary | ICD-10-CM | POA: Diagnosis not present

## 2022-10-23 DIAGNOSIS — L03116 Cellulitis of left lower limb: Secondary | ICD-10-CM | POA: Diagnosis not present

## 2022-10-23 DIAGNOSIS — E1151 Type 2 diabetes mellitus with diabetic peripheral angiopathy without gangrene: Secondary | ICD-10-CM | POA: Insufficient documentation

## 2022-10-23 DIAGNOSIS — I2510 Atherosclerotic heart disease of native coronary artery without angina pectoris: Secondary | ICD-10-CM | POA: Insufficient documentation

## 2022-10-23 DIAGNOSIS — L97828 Non-pressure chronic ulcer of other part of left lower leg with other specified severity: Secondary | ICD-10-CM | POA: Diagnosis not present

## 2022-10-23 DIAGNOSIS — E1136 Type 2 diabetes mellitus with diabetic cataract: Secondary | ICD-10-CM | POA: Insufficient documentation

## 2022-10-23 DIAGNOSIS — I110 Hypertensive heart disease with heart failure: Secondary | ICD-10-CM | POA: Diagnosis not present

## 2022-10-23 NOTE — Progress Notes (Addendum)
[place], [place] L ([id number]) [id number] Page 1 of 9 Visit Report for 10/23/2022 Chief Complaint Document Details Patient Name: Date of Service: [name] MES L. 10/23/2022 8:00 A M Medical Record Number: [id number] Patient Account Number: [id number] Date of Birth/Sex: Treating RN: August 09, 1966 (57 y.o. 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 10/23/2022 8:00 A M Medical Record Number: [id number] Patient Account Number: [id number] Date of Birth/Sex: Treating RN: 1966/04/01 (57 y.o. [name] Primary Care Provider: [name] Other Clinician: Referring Provider: Treating Provider/Extender: [name] in Treatment: 8 History of Present Illness HPI Description: 10/31/2021 patient presents today for evaluation here in our clinic. He is actually being evaluated for hyperbaric oxygen therapy only. He has been seen at [facility] up to this point he is under the care of the vascular surgery specialty. Subsequently he is also under the care of the hyperbaric center there. With that being said that due to the fact that he is a dialysis patient he was actually recommended to be transferred to [place] for his treatments he actually lives in [place] which is where his dialysis is. It was a hardship for him to try to get from [place] especially on dialysis days all the way to [facility] get into the facility for his treatment and get  back home he was literally spending all day long. He has had just 4 treatments there at [facility] thus far based on what I see in these have not been continued while he awaits approval here in our clinic. Subsequently I do have records for review that will be included in the HPI predetermination review and attached to this chart as well. I will not duplicate that here. 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 Nonetheless he continues to have exposed bone with marked soft tissue swelling compatible with osteomyelitis. This is due to the irregularity of the head of the metatarsal as well. Nonetheless along with having associated cellulitis he is also good to be seen by infectious disease and is currently on antibiotics for this as well. Again that will be detailed in the pretreatment review section which I will attach to this note as well. He does currently have a wound VAC in place and again the wound was not evaluated here in the clinic as that is being completely managed by home health and [facility]. The patient does have a history of diabetes mellitus type 2, hypertension, coronary artery disease, and congestive heart failure. He also has cataracts of both eyes but no evidence of glaucoma on his most recent eye exam. 11/22/2021 we have been seeing this gentleman for hyperbaric oxygen therapy but have not actually evaluated his wound up to this point this was being [place], [place] L ([id number]) [phone].pdf Page 2 of 9 managed by the wound care and vascular team I presumed at [facility] at least that is what has been told to be previous. Nonetheless at this point I got a  call from Atlee Abide who is a physician assistant at the [facility] vascular clinic with Dr. Durene Fruits and subsequently they were wanting to know if I could take  over wound care for this patient. Apparently they do not have the ability for an office debridement which again I completely understand but nonetheless definitely is not an integral part of his healing along with the hyperbarics. Subsequently I discussed with the patient today that I do believe he would benefit going ahead and proceeding with evaluation here in the clinic for that reason I did go ahead and see him today to get things started. There is also been some confusion about the issues with his home health nurse and getting measurements to [facility] for the wound VAC in order to continue his wound VAC therapy. With that being said after I see him today we will make a determination on what to do with wound VAC we can definitely send measurements to [facility] but again the bigger question is whether this is the best way to go or not. 11/28/2021 upon evaluation today patient appears to be doing decently well in regard to his wound. He has been tolerating the dressing changes with the Dakin's moistened gauze dressing which I think is doing a good job. Fortunately I do not see any signs of active infection locally nor systemically at this point. In general I think that he is actually making excellent progress which is great news. 2/17; seen in conjunction with HBO today. 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 Much improved condition of the wound surface. On the lateral foot and eschared area that he has been using Betadine. T olerating HBO well 12/13/2021 upon evaluation today patient appears to be doing pretty well in regard to his foot ulcer. I am actually much more pleased currently that I been with the way the appearance looks today. The wound bed does show some signs of slough and biofilm on the surface of the wound this is minimal I Minna perform debridement the I will do so very carefully due to the fact that to be honest the patient had a significant amount of bleeding  in the past being on the Brilinta he had a difficult time clotting when I debrided him previously quite significantly. Fortunately I do not see any signs of active infection at this time which is great news. No fevers, chills, nausea, vomiting, or diarrhea. 12/20/2021 upon evaluation today patient actually appears to be showing some signs of excellent improvement here. I am very pleased with where things stand currently. There does not appear to be any evidence of active infection locally nor systemically which is great news and I do believe that with the hyperbaric oxygen therapy he is actually making excellent progress. 3/9; patient presents for follow-up. He continues to do HBO therapy without issues. He has completed 34 out of 40 sessions. He continues to use Dakin's wet- to-dry to the amputation site and Betadine to the lateral foot wound. he currently denies signs of infection. 01/03/2022 upon evaluation today patient appears to be doing better in regard to his distal foot ulcer. Unfortunately the lateral foot is a different story this area has softened up and is getting need to be debrided away today. I Georgina Peer do this with scissors and forceps and was able to clear that out that will be detailed below. Otherwise I feel like the Dakin's moistened gauze dressing is doing an awesome job for him and  he is making significantly improvements here. 01/10/2022 upon evaluation today patient continues to have some of the necrotic tissue loosen up on the lateral portion of his foot the main wound that we have been treating is actually doing quite well. Overall very pleased with where things stand in general. No fevers, chills, nausea, vomiting, or diarrhea. 01/17/2022 upon evaluation today patient appears to be doing somewhat poorly in regard to the lateral foot wound. The main wound that we initially were seeing him for looks to be doing great. This lateral foot has worsened and unfortunately it appears to  potentially have some signs of infection here. I do believe that we may need to see about getting started on some IV antibiotics he is in dialysis so they could potentially do this for him, Monday. Nonetheless I want to try to get that started ASAP. Again I would prefer him be initiated with IV vancomycin and cefepime just as an empiric broad-spectrum until we can get the culture results back at least. Subsequently also think that this should be done sooner rather than later due to the fact that his leg is more swollen and overall this seems to have progressed fairly rapidly since last week I do not want this to get worse. He voiced understanding. 01-24-2022 upon evaluation today patient appears to be doing well with regard to his main ulcer that we have been seeing him for. I am very pleased with where things stand currently. He still continues to have bleeding if we do any type of aggressive sharp debridement there for him having to be very careful using Dakin's solution along with debridement as I can here in the office. Overall the patient seems to be doing well. Unfortunately the lateral foot wound still has progressed a little bit further. We did get him on antibiotics starting this past Monday I think the drainage looks much better and overall I am seeing improvement but again this was already broken down to a certain degree I am hopeful by next week this will really start turnaround for [place]. We will also get a send him for an x-ray to evaluate for any signs of osteomyelitis on this lateral foot. 01-31-2022 upon evaluation today patient's wound on the medial left amputation site is actually doing excellent. I am very pleased with where things stand this is significantly smaller and we are headed in the right direction. In regard to the left lateral foot there is still some necrotic tissue to be cleared away although I see evidence of this starting to basically solidify as far as the edges are  concerned with how far it spread I do not see anything seeming to want to spread further. I believe this may be close to done as far as any evolving is concerned. 02-07-2022 upon evaluation today patient appears to be doing well with regard to the main amputation site. Unfortunately the secondary site on the left lateral foot is still giving him some trouble here. There is a lot of dried tissue and I do believe that along with the Dakin's we may want to use a contact layer in the base of the wound to try to prevent the bone from drying out because we are down to that point at this time. The patient voiced understanding at this point as well and is happy to do what ever it takes to try to get this better. With that being said I do believe that it is possible he may benefit from a wound  VAC. Again the question is the approval process for getting this done obviously we can see what we can do if we get things cleaned a little bit better but right now I think the best option is going to be for [place] to continue with the packing currently. 02-14-2022 upon evaluation today patient presents for follow-up concerning ongoing issues with his foot. He did have the MRI which does show that he has evidence of osteomyelitis of the digit at the fifth location of his foot. Subsequently this does extend down to the base of the digit and into the proximal portion of the metatarsal as well. With that being said the bone does not appear to be too severely broken down and a lot of the marrow signal is stated to be maintained which is good news. Nonetheless I do believe that he is unfortunately still in a spot where this is precarious and we need to make sure that we are treating this appropriately. He does seem to be doing well with IV antibiotics he also seems to be doing quite well when it comes to the overall appearance of the wound bed at this point. I am happy in that regard. 02-21-2022 upon evaluation today patient appears  to be doing well all things considered in regard to his wounds. I do feel like that he is making some progress here. The wound on the right distal foot where the amputation site is is actually healing nicely I feel like we can switch over to collagen at this point that should do well for him. In regard to the left lateral foot this is actually getting much cleaner he still has some area where there seems to be some pressure getting to the location I think that he would do well with a knee scooter at this point. I discussed that with him he does not seem prone to going that route at this point he understands it can definitely help. 02-28-2022 upon evaluation today patient appears to be doing well currently with regard to the wound on his foot. The main surgical wound is actually significantly improved and the lateral wound is doing better there is still bone exposed but nonetheless this does not appear to be significantly worse than what we have noted previous. In fact I think it looks better than it did last week. My goal is to keep the bone from drying out and subsequently for that reason we are going to go ahead and proceed with the EpiFix which I think is good to be a good option here for him. Over top of the EpiFix we will do a contact layer in order to hopefully keep things from drying out. This dressing will actually stay in place until he comes back. Patient did see vascular yesterday and states that they did not feel like anything was doing worse still it appears that if it were left up to them they think the ideal thing would be to proceed with an amputation. I explained to the patient that to be honest is not always avoidable but if we can try to avoid amputation that is what we are after. He voiced understanding and he does want to do what ever he can to try to prevent amputation for that reason we are proceeding with the epi cord today. This will be application #1. [id number] upon evaluation  today patient appears to be making some progress here regard to the lateral foot. Fortunately there does not appear to be any signs  of active infection locally or systemically which is great news. No fevers, chills, nausea, vomiting, or diarrhea. I do think that he is appropriate for second application of EpiCord today. 03-14-2022 upon evaluation today patient's wound still has significant exposure of the bone at this point. He did get crutches he has been trying to use those the past couple of days am not sure that is going so well to be perfectly honest. We did discuss however offloading is important and we even discussed a total contact cast but to be perfectly honest right now and feels like he may need more debridement to clear away some of the bone which is not viable in the end of the foot of the fifth metatarsal but I am unable to really do that here in the office due to his bleeding. I do not know if potentially we could have him seen by podiatry potentially Dr. Merril Abbe to see if there is anything he can do to help in this regard. I am not even certain whether the majority of the [place], [place] L ([id number]) [phone].pdf Page 3 of 9 metatarsal is even viable to be honest. Either way I do think that he would benefit from surgical debridement or even possibly partial ray amputation in order for [place] to try to keep things moving in the right direction and get this closed. This was all discussed with the patient today. With that being said I know he wants to avoid a below-knee amputation which is what has previously been recommended. 03-21-2022 upon evaluation today patient appears to be doing well in regard to the medial wound at this is shown signs of excellent improvement. In regard to the lateral wound this is a bit dry compared to what I want to see him Regino Schultze not draining as much but I think that we need to see what we can do about trying to keep this from drying out  and keeping it a bit more moist. Patient voiced understanding. Overall I am very pleased otherwise with where we stand. 03-27-2022 upon evaluation today patient appears to be doing well currently in regard to his wound especially on the medial aspect this looks really good the lateral aspect is looking a little bit better to with some healthier appearance to the bone compared to last week to be honest. Fortunately I do not see any signs of active infection locally or systemically at this time which is great news. No fevers, chills, nausea, vomiting, or diarrhea. READMISSION 08/28/2022 This is a 57 year old man who is a diabetic on dialysis but not currently on any treatment for it. He has a known history of PAD. He was in this clinic for a protracted period of time this year for a left foot wound with underlying osteomyelitis for which she was treated with hyperbaric oxygen. As I understand things he left the clinic in June with wounds and an unhealed state possibly infected. 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 Unfortunately in September he required a revision of the BKA but he was ultimately admitted to [facility] for a protracted period with bacteremia secondary to group A strep from 9/12-10/12. Marland Kitchen He is recently completed vancomycin he was taking at dialysis He has been left with a nonhealing surgical wound on his left BKA amputation site. He recently followed with vascular surgery who recommended Santyl and Vashe solution which they have been applying  to the wound every day. It is noted that he does have severe PAD in the right lower extremity that is status post transmetatarsal amputation he is going to follow him up in 3 months. He is also seen infectious disease at [facility] who state that he had MRSA treated with cefazolin and vancomycin at hemodialysis. However he is listed as having an allergic reaction to cefazolin including lip  swelling therefore he completed vancomycin. He is not felt to require any additional antibiotics. 09-04-2022 upon evaluation today patient appears to be doing well currently in regard to his wound on the medial aspect of this is amputation site where he had a BKA. On the lateral aspect however he has an area of the incision line that is open that actually goes somewhat deep. We did probe about 4 cm down although I do not feel any bone directly. Fortunately there does not appear to be any signs of active infection locally or systemically at this time. There is some concern here in my estimation about the possibility of infection and I think that he may need some extension of the antibiotics I Georgina Peer probably put him on linezolid as he was previously on vancomycin. 09-16-2022 upon evaluation today patient appears to be doing well currently in regard to his wound. There is good to be some need for sharp debridement today but fortunately nothing too significant. Fortunately I do not see any signs of active infection locally nor systemically at this time which is great news. No fevers, chills, nausea, vomiting, or diarrhea. 09-23-2022 upon evaluation today patient appears to be doing well currently in regard to his wounds that were still in the cleaning up phase in regard to the primary wound where he dehisced. There is a lot of necrotic tissue that is can have to be cleaned out working on that at this point. 09-30-2022 upon evaluation today patient appears to be doing well currently in regard to his wounds. He is slowly making progress and getting the main open area on the medial aspect of his incision line in a much healthier state. In general I feel like that we are headed in the right direction. I do not see any signs of active infection at this time. 10-07-2022 upon evaluation today patient appears to be doing well currently in regard to his wound. He has been tolerating the dressing changes  without complication of the lateral portion of the wound actually is closed the more medial portion is doing much better the swelling overall is excellent. He has been using the Tubigrip as a compression on the end of the stump and that has done extremely well for him. 12/26; unfortunately the patient's wound on the left BKA stump has a deeper area from 6-9 o'clock. There is not been any drainage she is not complaining of any pain. He states that he has been up in his wheelchair with his stump dependent quite a bit. This might account for why the edema in the stump seems out of control. 10-23-2022 upon evaluation today patient appears to be doing well currently in regard to his wound. This is actually showing signs of improvement I do believe he would benefit from the possibility of a snap VAC. Again we will get a check into his insurance to see if we can get approval. If we are able to that would definitely benefit him I do believe. Subsequently I will get him to look into this when she gets back in the office come Monday.  Electronic Signature(s) Signed: 10/24/2022 1:19:15 PM By: Worthy Keeler PA-C Entered By: Worthy Keeler on 10/24/2022 13:19:14 -------------------------------------------------------------------------------- Physical Exam Details Patient Name: Date of Service: [name], [name] MES L. 10/23/2022 8:00 A M Medical Record Number: [id number] Patient Account Number: [id number] Date of Birth/Sex: Treating RN: 1966/06/09 (57 y.o. [name]) [name] Primary Care Provider: [name] Other Clinician: Referring Provider: Treating Provider/Extender: [name], [place] ([id number]) [phone].pdf Page 4 of 9 Well-nourished and well-hydrated in no acute distress. Respiratory normal breathing without difficulty. Psychiatric this patient is able to make decisions and demonstrates good insight into disease  process. Alert and Oriented x 3. pleasant and cooperative. Notes Upon inspection patient's wound bed actually showed signs of good granulation epithelization at this point. Fortunately I do not see any signs of active infection no sharp debridement performed today as it looks fairly clean we will see how things appear to come next week. Electronic Signature(s) Signed: 10/24/2022 1:19:37 PM By: Worthy Keeler PA-C Entered By: Worthy Keeler on 10/24/2022 13:19:37 -------------------------------------------------------------------------------- Physician Orders Details Patient Name: Date of Service: [name], [place] MES L. 10/23/2022 8:00 A M Medical Record Number: [id number] Patient Account Number: [id number] Date of Birth/Sex: Treating RN: [birth year] (57 y.o. [name]) [name] Primary Care Provider: [name] Other Clinician: Referring Provider: Treating Provider/Extender: [name] in Treatment: 8 Verbal / Phone Orders: No Diagnosis Coding ICD-10 Coding Code Description [phone] Non-pressure chronic ulcer of other part of left lower leg with other specified severity Z89.512 Acquired absence of left leg below knee E11.51 Type 2 diabetes mellitus with diabetic peripheral angiopathy without gangrene L03.116 Cellulitis of left lower limb Follow-up Appointments Return Appointment in 1 week. [place] for wound care. May utilize formulary equivalent dressing for wound treatment orders unless otherwise specified. Home Health Nurse may visit PRN to address patients wound care needs. Jackquline [place] [phone] Bathing/ Shower/ Hygiene May shower; gently cleanse wound with antibacterial soap, rinse and pat dry prior to dressing wounds Edema Control - Lymphedema / Segmental Compressive Device / Other Tubigrip double layer applied - to stump and above knee Wound Treatment Wound #3 - Amputation Site - Below Knee Wound Laterality: Left Prim Dressing:  Silvercel 4 1/4x 4 1/4 (in/in) 1 x Per Day/30 Days ary Discharge Instructions: Apply Silvercel 4 1/4x 4 1/4 (in/in) as instructed Secondary Dressing: Gauze 1 x Per Day/30 Days Secured With: Medipore T - 29M Medipore H Soft Cloth Surgical T ape ape, 2x2 (in/yd) 1 x Per Day/30 Days Secured With: Tubigrip Size D, 3x10 (in/yd) 1 x Per Day/30 Days Discharge Instructions: double layer to thigh NIJEL, [place] ([id number]) [id number] Page 5 of 9 Electronic Signature(s) Signed: 10/23/2022 5:05:00 PM By: [name] RN Signed: 10/24/2022 1:38:11 PM By: Worthy Keeler PA-C Entered By: [name] on 10/23/2022 08:32:45 -------------------------------------------------------------------------------- Problem List Details Patient Name: Date of Service: [name], JA MES L. 10/23/2022 8:00 A M Medical Record Number: [id number] Patient Account Number: [id number] Date of Birth/Sex: Treating RN: 12/06/1965 (57 y.o. [name]) [name] Primary Care Provider: [name] Other Clinician: Referring Provider: Treating Provider/Extender: [name] in Treatment: 8 Active Problems ICD-10 Encounter Code Description Active Date MDM Diagnosis L97.828 Non-pressure chronic ulcer of other part of left lower leg with other specified 08/28/2022 No Yes severity Z89.512 Acquired absence of left leg below knee 08/28/2022 No Yes E11.51 Type 2 diabetes mellitus with diabetic peripheral angiopathy without  gangrene 08/28/2022 No Yes L03.116 Cellulitis of left lower limb 08/28/2022 No Yes Inactive Problems Resolved Problems Electronic Signature(s) Signed: 10/23/2022 8:28:40 AM By: Worthy Keeler PA-C Entered By: Worthy Keeler on 10/23/2022 [address], Joyice Faster ([id number]) [id number] Page 6 of 9 -------------------------------------------------------------------------------- Progress Note Details Patient Name: Date of Service: [name] MES L.  10/23/2022 8:00 A M Medical Record Number: [id number] Patient Account Number: [id number] Date of Birth/Sex: Treating RN: [birth year] (57 y.o. 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 He is actually being evaluated for hyperbaric oxygen therapy only. He has been seen at [facility] up to this point he is under the care of the vascular surgery specialty. Subsequently he is also under the care of the hyperbaric center there. With that being said that due to the fact that he is a dialysis patient he was actually recommended to be transferred to [place] for his treatments he actually lives in [place] which is where his dialysis is. It was a hardship for him to try to get from [place] especially on dialysis days all the way to [facility] get into the facility for his treatment and get back home he was literally spending all day long. He has had just 4 treatments there at [facility] thus far based on what I see in these have not been continued while he awaits approval here in our clinic. Subsequently I do have records for review that will be included in the HPI predetermination review and attached to this chart as well. I will not duplicate that here. 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 Nonetheless he continues to have exposed bone with marked soft tissue swelling compatible with osteomyelitis. This is due to the irregularity of the head of the metatarsal as well. Nonetheless along with having  associated cellulitis he is also good to be seen by infectious disease and is currently on antibiotics for this as well. Again that will be detailed in the pretreatment review section which I will attach to this note as well. He does currently have a wound VAC in place and again the wound was not evaluated here in the clinic as that is being completely managed by home health and [facility]. The patient does have a history of diabetes mellitus type 2, hypertension, coronary artery disease, and congestive heart failure. He also has cataracts of both eyes but no evidence of glaucoma on his most recent eye exam. 11/22/2021 we have been seeing this gentleman for hyperbaric oxygen therapy but have not actually evaluated his wound up to this point this was being managed by the wound care and vascular team I presumed at [facility] at least that is what has been told to be previous. Nonetheless at this point I got a call from Atlee Abide who is a Librarian, academic at the [facility] vascular clinic with Dr. Durene Fruits and subsequently they were wanting to know if I could take over wound care for this patient. Apparently they do not have the ability for an office debridement which again I completely understand  but nonetheless definitely is not an integral part of his healing along with the hyperbarics. Subsequently I discussed with the patient today that I do believe he would benefit going ahead and proceeding with evaluation here in the clinic for that reason I did go ahead and see him today to get things started. There is also been some confusion about the issues with his home health nurse and getting measurements to [facility] for the wound VAC in order to continue his wound VAC therapy. With that being said after I see him today we will make a determination on what to do with wound VAC we can definitely send measurements to [facility] but again the bigger question is whether this is the best way to go or not. 11/28/2021 upon  evaluation today patient appears to be doing decently well in regard to his wound. He has been tolerating the dressing changes with the Dakin's moistened gauze dressing which I think is doing a good job. Fortunately I do not see any signs of active infection locally nor systemically at this point. In general I think that he is actually making excellent progress which is great news. 2/17; seen in conjunction with HBO today. 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 Much improved condition of the wound surface. On the lateral foot and eschared area that he has been using Betadine. T olerating HBO well 12/13/2021 upon evaluation today patient appears to be doing pretty well in regard to his foot ulcer. I am actually much more pleased currently that I been with the way the appearance looks today. The wound bed does show some signs of slough and biofilm on the surface of the wound this is minimal I Minna perform debridement the I will do so very carefully due to the fact that to be honest the patient had a significant amount of bleeding in the past being on the Brilinta he had a difficult time clotting when I debrided him previously quite significantly. Fortunately I do not see any signs of active infection at this time which is great news. No fevers, chills, nausea, vomiting, or diarrhea. 12/20/2021 upon evaluation today patient actually appears to be showing some signs of excellent improvement here. I am very pleased with where things stand currently. There does not appear to be any evidence of active infection locally nor systemically which is great news and I do believe that with the hyperbaric oxygen therapy he is actually making excellent progress. 3/9; patient presents for follow-up. He continues to do HBO therapy without issues. He has completed 34 out of 40 sessions. He continues to use Dakin's wet- to-dry to the amputation site and Betadine to the lateral foot  wound. he currently denies signs of infection. 01/03/2022 upon evaluation today patient appears to be doing better in regard to his distal foot ulcer. Unfortunately the lateral foot is a different story this area has softened up and is getting need to be debrided away today. I Georgina Peer do this with scissors and forceps and was able to clear that out that will be detailed below. Otherwise I feel like the Dakin's moistened gauze dressing is doing an awesome job for him and he is making significantly improvements here. 01/10/2022 upon evaluation today patient continues to have some of the necrotic tissue loosen up on the lateral portion of his foot the main wound that we have been treating is actually doing quite well. Overall very pleased with where things stand in general.  No fevers, chills, nausea, vomiting, or diarrhea. 01/17/2022 upon evaluation today patient appears to be doing somewhat poorly in regard to the lateral foot wound. The main wound that we initially were seeing him for looks to be doing great. This lateral foot has worsened and unfortunately it appears to potentially have some signs of infection here. I do believe that we may need to see about getting started on some IV antibiotics he is in dialysis so they could potentially do this for him, Monday. Nonetheless I want to try to get that started ASAP. Again I would prefer him be initiated with IV vancomycin and cefepime just as an empiric broad-spectrum until we can get the culture results back at least. Subsequently also think that this should be done sooner rather than later due to the fact that his leg is more swollen and overall this seems to have progressed fairly rapidly since last week I do not want this to get worse. He voiced understanding. 01-24-2022 upon evaluation today patient appears to be doing well with regard to his main ulcer that we have been seeing him for. I am very pleased with where things stand currently. He still  continues to have bleeding if we do any type of aggressive sharp debridement there for him having to be very careful using Dakin's solution along with debridement as I can here in the office. Overall the patient seems to be doing well. Unfortunately the lateral foot wound still has progressed a little bit further. We did get him on antibiotics starting this past Monday I think the drainage looks much better and overall I am seeing improvement but again this was already broken down to a certain degree I am hopeful by next week this will really start turnaround for [place]. We will also get a send him for an x-ray to evaluate for any signs of osteomyelitis on this lateral foot. 01-31-2022 upon evaluation today patient's wound on the medial left amputation site is actually doing excellent. I am very pleased with where things stand this is significantly smaller and we are headed in the right direction. In regard to the left lateral foot there is still some necrotic tissue to be cleared away although I see evidence of this starting to basically solidify as far as the edges are concerned with how far it spread I do not see anything seeming to want to spread further. I believe this may be close to done as far as any evolving is concerned. 02-07-2022 upon evaluation today patient appears to be doing well with regard to the main amputation site. Unfortunately the secondary site on the left lateral [place], [place] L ([id number]) [id number].pdf Page 7 of 9 foot is still giving him some trouble here. There is a lot of dried tissue and I do believe that along with the Dakin's we may want to use a contact layer in the base of the wound to try to prevent the bone from drying out because we are down to that point at this time. The patient voiced understanding at this point as well and is happy to do what ever it takes to try to get this better. With that being said I do believe that it is possible he may  benefit from a wound VAC. Again the question is the approval process for getting this done obviously we can see what we can do if we get things cleaned a little bit better but right now I think the best option is going to be  for [place] to continue with the packing currently. 02-14-2022 upon evaluation today patient presents for follow-up concerning ongoing issues with his foot. He did have the MRI which does show that he has evidence of osteomyelitis of the digit at the fifth location of his foot. Subsequently this does extend down to the base of the digit and into the proximal portion of the metatarsal as well. With that being said the bone does not appear to be too severely broken down and a lot of the marrow signal is stated to be maintained which is good news. Nonetheless I do believe that he is unfortunately still in a spot where this is precarious and we need to make sure that we are treating this appropriately. He does seem to be doing well with IV antibiotics he also seems to be doing quite well when it comes to the overall appearance of the wound bed at this point. I am happy in that regard. 02-21-2022 upon evaluation today patient appears to be doing well all things considered in regard to his wounds. I do feel like that he is making some progress here. The wound on the right distal foot where the amputation site is is actually healing nicely I feel like we can switch over to collagen at this point that should do well for him. In regard to the left lateral foot this is actually getting much cleaner he still has some area where there seems to be some pressure getting to the location I think that he would do well with a knee scooter at this point. I discussed that with him he does not seem prone to going that route at this point he understands it can definitely help. 02-28-2022 upon evaluation today patient appears to be doing well currently with regard to the wound on his foot. The main surgical wound  is actually significantly improved and the lateral wound is doing better there is still bone exposed but nonetheless this does not appear to be significantly worse than what we have noted previous. In fact I think it looks better than it did last week. My goal is to keep the bone from drying out and subsequently for that reason we are going to go ahead and proceed with the EpiFix which I think is good to be a good option here for him. Over top of the EpiFix we will do a contact layer in order to hopefully keep things from drying out. This dressing will actually stay in place until he comes back. Patient did see vascular yesterday and states that they did not feel like anything was doing worse still it appears that if it were left up to them they think the ideal thing would be to proceed with an amputation. I explained to the patient that to be honest is not always avoidable but if we can try to avoid amputation that is what we are after. He voiced understanding and he does want to do what ever he can to try to prevent amputation for that reason we are proceeding with the epi cord today. This will be application #1. 01-27-7352 upon evaluation today patient appears to be making some progress here regard to the lateral foot. Fortunately there does not appear to be any signs of active infection locally or systemically which is great news. No fevers, chills, nausea, vomiting, or diarrhea. I do think that he is appropriate for second application of EpiCord today. 03-14-2022 upon evaluation today patient's wound still has significant exposure of the  bone at this point. He did get crutches he has been trying to use those the past couple of days am not sure that is going so well to be perfectly honest. We did discuss however offloading is important and we even discussed a total contact cast but to be perfectly honest right now and feels like he may need more debridement to clear away some of the bone which is not  viable in the end of the foot of the fifth metatarsal but I am unable to really do that here in the office due to his bleeding. I do not know if potentially we could have him seen by podiatry potentially Dr. Merril Abbe to see if there is anything he can do to help in this regard. I am not even certain whether the majority of the metatarsal is even viable to be honest. Either way I do think that he would benefit from surgical debridement or even possibly partial ray amputation in order for [place] to try to keep things moving in the right direction and get this closed. This was all discussed with the patient today. With that being said I know he wants to avoid a below-knee amputation which is what has previously been recommended. 03-21-2022 upon evaluation today patient appears to be doing well in regard to the medial wound at this is shown signs of excellent improvement. In regard to the lateral wound this is a bit dry compared to what I want to see him Regino Schultze not draining as much but I think that we need to see what we can do about trying to keep this from drying out and keeping it a bit more moist. Patient voiced understanding. Overall I am very pleased otherwise with where we stand. 03-27-2022 upon evaluation today patient appears to be doing well currently in regard to his wound especially on the medial aspect this looks really good the lateral aspect is looking a little bit better to with some healthier appearance to the bone compared to last week to be honest. Fortunately I do not see any signs of active infection locally or systemically at this time which is great news. No fevers, chills, nausea, vomiting, or diarrhea. READMISSION 08/28/2022 This is a 58 year old man who is a diabetic on dialysis but not currently on any treatment for it. He has a known history of PAD. He was in this clinic for a protracted period of time this year for a left foot wound with underlying osteomyelitis for which she was  treated with hyperbaric oxygen. As I understand things he left the clinic in June with wounds and an unhealed state possibly infected. 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 Unfortunately in September he required a revision of the BKA but he was ultimately admitted to [facility] for a protracted period with bacteremia secondary to group A strep from 9/12-10/12. Marland Kitchen He is recently completed vancomycin he was taking at dialysis He has been left with a nonhealing surgical wound on his left BKA amputation site. He recently followed with vascular surgery who recommended Santyl and Vashe solution which they have been applying to the wound every day. It is noted that he does have severe PAD in the right lower extremity that is status post transmetatarsal amputation he is going to follow him up in 3 months. He is also seen infectious disease at [facility] who state that he had MRSA treated  with cefazolin and vancomycin at hemodialysis. However he is listed as having an allergic reaction to cefazolin including lip swelling therefore he completed vancomycin. He is not felt to require any additional antibiotics. 09-04-2022 upon evaluation today patient appears to be doing well currently in regard to his wound on the medial aspect of this is amputation site where he had a BKA. On the lateral aspect however he has an area of the incision line that is open that actually goes somewhat deep. We did probe about 4 cm down although I do not feel any bone directly. Fortunately there does not appear to be any signs of active infection locally or systemically at this time. There is some concern here in my estimation about the possibility of infection and I think that he may need some extension of the antibiotics I Georgina Peer probably put him on linezolid as he was previously on vancomycin. 09-16-2022 upon evaluation today patient appears to be doing well currently in  regard to his wound. There is good to be some need for sharp debridement today but fortunately nothing too significant. Fortunately I do not see any signs of active infection locally nor systemically at this time which is great news. No fevers, chills, nausea, vomiting, or diarrhea. 09-23-2022 upon evaluation today patient appears to be doing well currently in regard to his wounds that were still in the cleaning up phase in regard to the primary wound where he dehisced. There is a lot of necrotic tissue that is can have to be cleaned out working on that at this point. 09-30-2022 upon evaluation today patient appears to be doing well currently in regard to his wounds. He is slowly making progress and getting the main open area on the medial aspect of his incision line in a much healthier state. In general I feel like that we are headed in the right direction. I do not see any signs of active infection at this time. 10-07-2022 upon evaluation today patient appears to be doing well currently in regard to his wound. He has been tolerating the dressing changes without complication of the lateral portion of the wound actually is closed the more medial portion is doing much better the swelling overall is excellent. He has been using the Tubigrip as a compression on the end of the stump and that has done extremely well for him. 12/26; unfortunately the patient's wound on the left BKA stump has a deeper area from 6-9 o'clock. There is not been any drainage she is not complaining of any pain. He states that he has been up in his wheelchair with his stump dependent quite a bit. This might account for why the edema in the stump seems out of control. 10-23-2022 upon evaluation today patient appears to be doing well currently in regard to his wound. This is actually showing signs of improvement I do believe he would benefit from the possibility of a snap VAC. Again we will get a check into his insurance to see if we  can get approval. If we are able to that would definitely benefit him I do believe. Subsequently I will get him to look into this when she gets back in the office come Monday. [place], [name] L ([id number]) [id number] Page 8 of 9 Objective Constitutional Well-nourished and well-hydrated in no acute distress. Vitals Time Taken: 8:06 AM, Height: 70 in, Weight: 166 lbs, BMI: 23.8, Temperature: 97.9 F, Pulse: 60 bpm, Respiratory Rate: 18 breaths/min, Blood Pressure: 171/89 mmHg. Respiratory normal breathing without difficulty.  Psychiatric this patient is able to make decisions and demonstrates good insight into disease process. Alert and Oriented x 3. pleasant and cooperative. General Notes: Upon inspection patient's wound bed actually showed signs of good granulation epithelization at this point. Fortunately I do not see any signs of active infection no sharp debridement performed today as it looks fairly clean we will see how things appear to come next week. Integumentary (Hair, Skin) Wound #3 status is Open. Original cause of wound was Surgical Injury. The date acquired was: 07/19/2022. The wound has been in treatment 8 weeks. 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 The wound measures 0.7cm length x 1.3cm width x 0.8cm depth; 0.715cm^2 area and 0.572cm^3 volume. There is Fat Layer (Subcutaneous Tissue) exposed. There is tunneling at 9:00 with a maximum distance of 1.2cm. There is a medium amount of serosanguineous drainage noted. There is medium (34-66%) pink granulation within the wound bed. There is a medium (34-66%) amount of necrotic tissue within the wound bed including Adherent Slough. Assessment Active Problems ICD-10 Non-pressure chronic ulcer of other part of left lower leg with other specified severity Acquired absence of left leg below knee Type 2 diabetes mellitus with diabetic peripheral angiopathy without gangrene Cellulitis of  left lower limb Plan Follow-up Appointments: Return Appointment in 1 week. Home Health: [facility] for wound care. May utilize formulary equivalent dressing for wound treatment orders unless otherwise specified. Home Health Nurse may visit PRN to address patientoos wound care needs. Jackquline Denmark [phone] Bathing/ Shower/ Hygiene: May shower; gently cleanse wound with antibacterial soap, rinse and pat dry prior to dressing wounds Edema Control - Lymphedema / Segmental Compressive Device / Other: Tubigrip double layer applied - to stump and above knee WOUND #3: - Amputation Site - Below Knee Wound Laterality: Left Prim Dressing: Silvercel 4 1/4x 4 1/4 (in/in) 1 x Per Day/30 Days ary Discharge Instructions: Apply Silvercel 4 1/4x 4 1/4 (in/in) as instructed Secondary Dressing: Gauze 1 x Per Day/30 Days Secured With: Medipore T - 37M Medipore H Soft Cloth Surgical T ape ape, 2x2 (in/yd) 1 x Per Day/30 Days Secured With: Tubigrip Size D, 3x10 (in/yd) 1 x Per Day/30 Days Discharge Instructions: double layer to thigh 1. I am good recommend that we have the patient continue with the silver cell packing followed by the gauze and roll gauze to secure in place. 2. He will continue with the Tubigrip size D which is also helping. 3. I would also suggest that the patient should continue to monitor for any signs of infection or worsening obviously if anything changes he knows contact the office and let me know. We will see patient back for reevaluation in 1 week here in the clinic. If anything worsens or changes patient will contact our office for additional recommendations. Electronic Signature(s) Signed: 10/24/2022 1:20:26 PM By: Irean Hong [place], [place] L ([id number]) [id number] Page 9 of 9 Entered By: Worthy Keeler on 10/24/2022 13:20:25 -------------------------------------------------------------------------------- SuperBill Details Patient  Name: Date of Service: [name] MES L. 10/23/2022 Medical Record Number: [id number] Patient Account Number: [id number] Date of Birth/Sex: Treating RN: [birth year] (57 y.o. [name]) [name] Primary Care Provider: [name] Other Clinician: Referring Provider: Treating Provider/Extender: [name] in Treatment: 8 Diagnosis Coding ICD-10 Codes Code Description [phone] Non-pressure chronic ulcer of other part of left lower leg with other specified severity Z89.512 Acquired absence of left leg below knee E11.51 Type 2 diabetes mellitus with  diabetic peripheral angiopathy without gangrene L03.116 Cellulitis of left lower limb Facility Procedures : CPT4 Code: [id number] Description: 56256 - WOUND CARE VISIT-LEV 2 EST PT Modifier: Quantity: 1 Physician Procedures : CPT4 Code Description Modifier [id number] - WC PHYS LEVEL 3 - EST PT ICD-10 Diagnosis Description L97.828 Non-pressure chronic ulcer of other part of left lower leg with other specified severity Z89.512 Acquired absence of left leg below knee E11.51  Type 2 diabetes mellitus with diabetic peripheral angiopathy without gangrene L03.116 Cellulitis of left lower limb Quantity: 1 Electronic Signature(s) Signed: 10/24/2022 1:20:38 PM By: Worthy Keeler PA-C Previous Signature: 10/23/2022 5:05:00 PM Version By: [name] RN Entered By: Worthy Keeler on 10/24/2022 13:20:38

## 2022-10-24 NOTE — Progress Notes (Signed)
[place], [place] [name] ([id number]) [id number] Page 1 of 9 Visit Report for 10/23/2022 Arrival Information Details Patient Name: Date of Service: [name] [name]. 10/23/2022 8:00 A M Medical Record Number: [id number] Patient Account Number: [id number] Date of Birth/Sex: Treating RN: 25-Apr-1966 (57 y.o. [name]) Dolores Lory, Morey Hummingbird Primary Care Makenlee Mckeag: [name] Other Clinician: Referring Delvonte Berenson: Treating Louden Houseworth/Extender: [name] in Treatment: 8 Visit Information History Since Last Visit Added or deleted any medications: No Patient Arrived: Wheel Chair Any new allergies or adverse reactions: No Arrival Time: 08:06 Had a fall or experienced change in No Accompanied By: wife activities of daily living that may affect Transfer Assistance: None risk of falls: Patient Identification Verified: Yes Signs or symptoms of abuse/neglect since last visito No Secondary Verification Process Completed: Yes Hospitalized since last visit: No Patient Requires Transmission-Based Precautions: No Implantable device outside of the clinic excluding No Patient Has Alerts: No cellular tissue based products placed in the center since last visit: Has Dressing in Place as Prescribed: Yes Has Compression in Place as Prescribed: Yes Pain Present Now: No Electronic Signature(s) Signed: 10/23/2022 5:05:00 PM By: [name] RN Entered By: [name] on 10/23/2022 08:06:36 -------------------------------------------------------------------------------- Clinic Level of Care Assessment Details Patient Name: Date of Service: [name] [name]. 10/23/2022 8:00 A M Medical Record Number: [id number] Patient Account Number: [id number] Date of Birth/Sex: Treating RN: 11-23-1965 (57 y.o. [name]) [name] Primary Care Enrico Eaddy: [name] Other Clinician: Referring Filippa [name]: Treating [name]/Extender: [name] in Treatment: 8 Clinic Level of Care  Assessment Items TOOL 4 Quantity Score X- 1 0 Use when only an EandM is performed on FOLLOW-UP visit ASSESSMENTS - Nursing Assessment / Reassessment X- 1 10 Reassessment of Co-morbidities (includes updates in patient status) X- 1 5 Reassessment of Adherence to Treatment Plan JAVION, HOLMER ([id number]) [phone].pdf Page 2 of 9 ASSESSMENTS - Wound and Skin A ssessment / Reassessment X - Simple Wound Assessment / Reassessment - one wound 1 5 []  - 0 Complex Wound Assessment / Reassessment - multiple wounds []  - 0 Dermatologic / Skin Assessment (not related to wound area) ASSESSMENTS - Focused Assessment []  - 0 Circumferential Edema Measurements - multi extremities []  - 0 Nutritional Assessment / Counseling / Intervention []  - 0 Lower Extremity Assessment (monofilament, tuning fork, pulses) []  - 0 Peripheral Arterial Disease Assessment (using hand held doppler) ASSESSMENTS - Ostomy and/or Continence Assessment and Care []  - 0 Incontinence Assessment and Management []  - 0 Ostomy Care Assessment and Management (repouching, etc.) PROCESS - Coordination of Care X - Simple Patient / Family Education for ongoing care 1 15 []  - 0 Complex (extensive) Patient / Family Education for ongoing care []  - 0 Staff obtains Programmer, systems, Records, T Results / Process Orders est []  - 0 Staff telephones HHA, Nursing Homes / Clarify orders / etc []  - 0 Routine Transfer to another Facility (non-emergent condition) []  - 0 Routine Hospital Admission (non-emergent condition) []  - 0 New Admissions / Biomedical engineer / Ordering NPWT Apligraf, etc. , []  - 0 Emergency Hospital Admission (emergent condition) []  - 0 Simple Discharge Coordination []  - 0 Complex (extensive) Discharge Coordination PROCESS - Special Needs []  - 0 Pediatric / Minor Patient Management []  - 0 Isolation Patient Management []  - 0 Hearing / Language / Visual special needs []  - 0 Assessment of  Community assistance (transportation, D/C planning, etc.) []  - 0 Additional assistance / Altered mentation []  - 0 Support Surface(s) Assessment (bed, cushion, seat, etc.)  INTERVENTIONS - Wound Cleansing / Measurement X - Simple Wound Cleansing - one wound 1 5 []  - 0 Complex Wound Cleansing - multiple wounds X- 1 5 Wound Imaging (photographs - any number of wounds) []  - 0 Wound Tracing (instead of photographs) X- 1 5 Simple Wound Measurement - one wound []  - 0 Complex Wound Measurement - multiple wounds INTERVENTIONS - Wound Dressings X - Small Wound Dressing one or multiple wounds 1 10 []  - 0 Medium Wound Dressing one or multiple wounds []  - 0 Large Wound Dressing one or multiple wounds []  - 0 Application of Medications - topical []  - 0 Application of Medications - injection INTERVENTIONS - Miscellaneous []  - 0 External ear exam [name], [name] ([id number]) [phone].pdf Page 3 of 9 []  - 0 Specimen Collection (cultures, biopsies, blood, body fluids, etc.) []  - 0 Specimen(s) / Culture(s) sent or taken to Lab for analysis []  - 0 Patient Transfer (multiple staff / [name] Lift / Similar devices) []  - 0 Simple Staple / Suture removal (25 or less) []  - 0 Complex Staple / Suture removal (26 or more) []  - 0 Hypo / Hyperglycemic Management (close monitor of Blood Glucose) []  - 0 Ankle / Brachial Index (ABI) - do not check if billed separately X- 1 5 Vital Signs Has the patient been seen at the hospital within the last three years: Yes Total Score: 65 Level Of Care: New/Established - Level 2 Electronic Signature(s) Signed: 10/23/2022 5:05:00 PM By: [name] RN Entered By: [name] on 10/23/2022 08:34:04 -------------------------------------------------------------------------------- Encounter Discharge Information Details Patient Name: Date of Service: [name], [name] [name]. 10/23/2022 8:00 A M Medical Record Number: [id number] Patient Account  Number: [id number] Date of Birth/Sex: Treating RN: [birth year] (57 y.o. [name]) [name] Primary Care Mintie Witherington: [name] Other Clinician: Referring Kalyna Paolella: Treating Breauna Mazzeo/Extender: [name] in Treatment: 8 Encounter Discharge Information Items Discharge Condition: Stable Ambulatory Status: Wheelchair Discharge Destination: Home Transportation: Private Auto Accompanied By: wife Schedule Follow-up Appointment: Yes Clinical Summary of Care: Electronic Signature(s) Signed: 10/23/2022 5:05:00 PM By: [name] RN Entered By: [name] on 10/23/2022 08:34:59 Lower Extremity Assessment Details -------------------------------------------------------------------------------- [name] ([id number]) [id number] Page 4 of 9 Patient Name: Date of Service: [name] [name]. 10/23/2022 8:00 A M Medical Record Number: [id number] Patient Account Number: [id number] Date of Birth/Sex: Treating RN: May 03, 1966 (57 y.o. [name]) [name] Primary Care Idonia Zollinger: [name] Other Clinician: Referring Hargun Spurling: Treating Eulon Allnutt/Extender: [name] in Treatment: 8 Electronic Signature(s) Signed: 10/23/2022 5:05:00 PM By: [name] RN Entered By: [name] on 10/23/2022 08:13:32 -------------------------------------------------------------------------------- Multi Wound Chart Details Patient Name: Date of Service: [name], [name] [name]. 10/23/2022 8:00 A M Medical Record Number: [id number] Patient Account Number: [id number] Date of Birth/Sex: Treating RN: 11/28/1965 (57 y.o. [name]) [name] Primary Care Niylah Hassan: [name] Other Clinician: Referring Adeline Petitfrere: Treating [name]/Extender: [name] in Treatment: 8 Vital Signs Height(in): 70 Pulse(bpm): 60 Weight(lbs): 166 Blood Pressure(mmHg): 171/89 Body Mass Index(BMI): 23.8 Temperature(F): 97.9 Respiratory Rate(breaths/min):  18 [3:Photos:] [N/A:N/A] Left Amputation Site - Below Knee N/A N/A Wound Location: Surgical Injury N/A N/A Wounding Event: Dehisced Wound N/A N/A Primary Etiology: Cataracts, Anemia, Sleep Apnea, N/A N/A Comorbid History: Arrhythmia, Congestive Heart Failure, Coronary Artery Disease, Hypertension, Myocardial Infarction, Type II Diabetes, Neuropathy, Seizure Disorder 07/19/2022 N/A N/A Date Acquired: 8 N/A N/A Weeks of Treatment: Open N/A N/A Wound Status: No N/A N/A Wound Recurrence: 0.7x1.3x0.8 N/A N/A Measurements [name]  x W x D (cm) 0.715 N/A N/A A (cm) : rea 0.572 N/A N/A Volume (cm) : 94.70% N/A N/A % Reduction in A rea: 85.90% N/A N/A % Reduction in Volume: 9 Position 1 (o'clock): 1.2 Maximum Distance 1 (cm): Yes N/A N/A Tunneling: Full Thickness Without Exposed N/A N/A Classification: Support Structures Medium N/A N/A Exudate Amount: Serosanguineous N/A N/A Exudate Type: Sesay, Dearion [name] ([id number]) [phone].pdf Page 5 of 9 red, brown N/A N/A Exudate Color: Medium (34-66%) N/A N/A Granulation Amount: Pink N/A N/A Granulation Quality: Medium (34-66%) N/A N/A Necrotic Amount: Fat Layer (Subcutaneous Tissue): Yes N/A N/A Exposed Structures: Fascia: No Tendon: No Muscle: No Joint: No Bone: No None N/A N/A Epithelialization: Treatment Notes Electronic Signature(s) Signed: 10/23/2022 5:05:00 PM By: [name] RN Entered By: [name] on 10/23/2022 08:13:36 -------------------------------------------------------------------------------- Multi-Disciplinary Care Plan Details Patient Name: Date of Service: [name], [name] [name]. 10/23/2022 8:00 A M Medical Record Number: [id number] Patient Account Number: [id number] Date of Birth/Sex: Treating RN: [birth year] (57 y.o. [name]) [name] Primary Care Zanayah Shadowens: [name] Other Clinician: Referring Jewelene Mairena: Treating [name]/Extender: [name] in  Treatment: 8 Active Inactive Necrotic Tissue Nursing Diagnoses: Knowledge deficit related to management of necrotic/devitalized tissue Goals: Necrotic/devitalized tissue will be minimized in the wound bed Date Initiated: 08/28/2022 Target Resolution Date: 10/17/2022 Goal Status: Active Interventions: Assess patient pain level pre-, during and post procedure and prior to discharge Notes: Nutrition Nursing Diagnoses: Impaired glucose control: actual or potential Goals: Patient/caregiver will maintain therapeutic glucose control Date Initiated: 08/28/2022 Target Resolution Date: 10/17/2022 Goal Status: Active Interventions: Assess HgA1c results as ordered upon admission and as needed Assess patient nutrition upon admission and as needed per policy Notes: [employer], Iktan [name] ([id number]) [id number].pdf Page 6 of 9 Nursing Diagnoses: Knowledge deficit related to ulceration/compromised skin integrity Goals: Patient/caregiver will verbalize understanding of skin care regimen Date Initiated: 08/28/2022 Target Resolution Date: 10/17/2022 Goal Status: Active Ulcer/skin breakdown will have a volume reduction of 30% by week 4 Date Initiated: 08/28/2022 Target Resolution Date: 10/17/2022 Goal Status: Active Ulcer/skin breakdown will have a volume reduction of 50% by week 8 Date Initiated: 08/28/2022 Target Resolution Date: 11/17/2022 Goal Status: Active Ulcer/skin breakdown will have a volume reduction of 80% by week 12 Date Initiated: 08/28/2022 Target Resolution Date: 12/18/2022 Goal Status: Active Ulcer/skin breakdown will heal within 14 weeks Date Initiated: 08/28/2022 Target Resolution Date: 01/16/2023 Goal Status: Active Interventions: Assess patient/caregiver ability to obtain necessary supplies Assess patient/caregiver ability to perform ulcer/skin care regimen upon admission and as needed Assess ulceration(s) every  visit Notes: Electronic Signature(s) Signed: 10/23/2022 5:05:00 PM By: [name] RN Entered By: [name] on 10/23/2022 08:34:25 -------------------------------------------------------------------------------- Pain Assessment Details Patient Name: Date of Service: [name], [name] [name]. 10/23/2022 8:00 A M Medical Record Number: [id number] Patient Account Number: [id number] Date of Birth/Sex: Treating RN: 12-01-1965 (57 y.o. [name] Primary Care Jonte Wollam: [name] Other Clinician: Referring Celene Pippins: Treating Disha Cottam/Extender: [name] in Treatment: 8 Active Problems Location of Pain Severity and Description of Pain Patient Has Paino No Site Locations [place], [name] ([id number]) [phone].pdf Page 7 of 9 Pain Management and Medication Current Pain Management: Electronic Signature(s) Signed: 10/23/2022 5:05:00 PM By: [name] RN Entered By: [name] on 10/23/2022 08:06:57 -------------------------------------------------------------------------------- Patient/Caregiver Education Details Patient Name: Date of Service: [name], [name] [name]. 1/4/2024andnbsp8:00 A M Medical Record Number: [id number] Patient Account Number: [id number] Date of Birth/Gender: Treating RN: 1965/11/21 (57 y.o. M) Epps,  Morey Hummingbird Primary Care Physician: [name] Other Clinician: Referring Physician: Treating Physician/Extender: [name] in Treatment: 8 Education Assessment Education Provided To: Patient Education Topics Provided Wound/Skin Impairment: Methods: Explain/Verbal Responses: State content correctly Electronic Signature(s) Signed: 10/23/2022 5:05:00 PM By: [name] RN Entered By: [name] on 10/23/2022 08:34:18 [name], [name] Faster ([id number]) [id number] Page 8 of 9 -------------------------------------------------------------------------------- Wound Assessment  Details Patient Name: Date of Service: [name] [name]. 10/23/2022 8:00 A M Medical Record Number: [id number] Patient Account Number: [id number] Date of Birth/Sex: Treating RN: 1966-08-23 (57 y.o. [name]) [name] Primary Care Siyona Coto: [name] Other Clinician: Referring Marykathleen Russi: Treating Laura-Lee Villegas/Extender: [name] in Treatment: 8 Wound Status Wound Number: 3 Primary Dehisced Wound Etiology: Wound Location: Left Amputation Site - Below Knee Wound Open Wounding Event: Surgical Injury Status: Date Acquired: 07/19/2022 Comorbid Cataracts, Anemia, Sleep Apnea, Arrhythmia, Congestive Heart Weeks Of Treatment: 8 History: Failure, Coronary Artery Disease, Hypertension, Myocardial Clustered Wound: No Infarction, Type II Diabetes, Neuropathy, Seizure Disorder Photos Wound Measurements Length: (cm) 0.7 Width: (cm) 1.3 Depth: (cm) 0.8 Area: (cm) 0.715 Volume: (cm) 0.572 % Reduction in Area: 94.7% % Reduction in Volume: 85.9% Epithelialization: None Tunneling: Yes Position (o'clock): 9 Maximum Distance: (cm) 1.2 Wound Description Classification: Full Thickness Without Exposed Suppor Exudate Amount: Medium Exudate Type: Serosanguineous Exudate Color: red, brown t Structures Foul Odor After Cleansing: No Slough/Fibrino Yes Wound Bed Granulation Amount: Medium (34-66%) Exposed Structure Granulation Quality: Pink Fascia Exposed: No Necrotic Amount: Medium (34-66%) Fat Layer (Subcutaneous Tissue) Exposed: Yes Necrotic Quality: Adherent Slough Tendon Exposed: No Muscle Exposed: No Joint Exposed: No Bone Exposed: No Treatment Notes Wound #3 (Amputation Site - Below Knee) Wound Laterality: Left [name] ([id number]) [phone].pdf Page 9 of 9 Topical Primary Dressing Silvercel 4 1/4x 4 1/4 (in/in) Discharge Instruction: Apply Silvercel 4 1/4x 4 1/4 (in/in) as instructed Secondary  Dressing Gauze Secured With [place] H Soft Cloth Surgical T ape ape, 2x2 (in/yd) Tubigrip Size D, 3x10 (in/yd) Discharge Instruction: double layer to thigh Compression Wrap Compression Stockings Add-Ons Electronic Signature(s) Signed: 10/23/2022 5:05:00 PM By: [name] RN Entered By: [name] on 10/23/2022 08:13:22 -------------------------------------------------------------------------------- Vitals Details Patient Name: Date of Service: [name], JA [name]. 10/23/2022 8:00 A M Medical Record Number: [id number] Patient Account Number: [id number] Date of Birth/Sex: Treating RN: 1965/12/25 (57 y.o. [name]) [name] Primary Care Chadley Dziedzic: [name] Other Clinician: Referring Addley Ballinger: Treating Alexios Keown/Extender: [name] in Treatment: 8 Vital Signs Time Taken: 08:06 Temperature (F): 97.9 Height (in): 70 Pulse (bpm): 60 Weight (lbs): 166 Respiratory Rate (breaths/min): 18 Body Mass Index (BMI): 23.8 Blood Pressure (mmHg): 171/89 Reference Range: 80 - 120 mg / dl Electronic Signature(s) Signed: 10/23/2022 5:05:00 PM By: [name] RN Entered By: [name] on 10/23/2022 08:06:51

## 2022-10-30 ENCOUNTER — Encounter: Payer: Medicare Other | Admitting: Physician Assistant

## 2022-10-30 DIAGNOSIS — E11622 Type 2 diabetes mellitus with other skin ulcer: Secondary | ICD-10-CM | POA: Diagnosis not present

## 2022-10-30 NOTE — Progress Notes (Addendum)
LEWIN, PELLOW ([id number]) [id number] Page 1 of 9 Visit Report for 10/30/2022 Chief Complaint Document Details Patient Name: Date of Service: [name] MES L. 10/30/2022 12:45 PM Medical Record Number: [id number] Patient Account Number: [id number] Date of Birth/Sex: Treating RN: [birth year] (57 y.o. 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 10/30/2022 12:45 PM Medical Record Number: [id number] Patient Account Number: [id number] Date of Birth/Sex: Treating RN: 11/01/1965 (57 y.o. [name] Primary Care Provider: [name] Other Clinician: Referring Provider: Treating Provider/Extender: [name] in Treatment: 9 History of Present Illness HPI Description: 10/31/2021 patient presents today for evaluation here in our clinic. He is actually being evaluated for hyperbaric oxygen therapy only. He has been seen at [facility] up to this point he is under the care of the vascular surgery specialty. Subsequently he is also under the care of the hyperbaric center there. With that being said that due to the fact that he is a dialysis patient he was actually recommended to be transferred to [place] for his treatments he actually lives in [place] which is where his dialysis is. It was a hardship for him to try to get from [place] especially on dialysis days all the way to [facility] get into the facility for his treatment  and get back home he was literally spending all day long. He has had just 4 treatments there at [facility] thus far based on what I see in these have not been continued while he awaits approval here in our clinic. Subsequently I do have records for review that will be included in the HPI predetermination review and attached to this chart as well. I will not duplicate that here. 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 Nonetheless he continues to have exposed bone with marked soft tissue swelling compatible with osteomyelitis. This is due to the irregularity of the head of the metatarsal as well. Nonetheless along with having associated cellulitis he is also good to be seen by infectious disease and is currently on antibiotics for this as well. Again that will be detailed in the pretreatment review section which I will attach to this note as well. He does currently have a wound VAC in place and again the wound was not evaluated here in the clinic as that is being completely managed by home health and [facility]. The patient does have a history of diabetes mellitus type 2, hypertension, coronary artery disease, and congestive heart failure. He also has cataracts of both eyes but no evidence of glaucoma on his most recent eye exam. 11/22/2021 we have been seeing this gentleman for hyperbaric oxygen therapy but have not actually evaluated his wound up to this point this was being Vincent, Inman L ([id number]) [phone].pdf Page 2 of 9 managed by the wound care and vascular team I presumed at [facility] at least that is what has been told to be previous. Nonetheless at this point I got a call from  Atlee Abide who is a Librarian, academic at the [facility] vascular clinic with Dr. Durene Fruits and subsequently they were wanting to know if I could take  over wound care for this patient. Apparently they do not have the ability for an office debridement which again I completely understand but nonetheless definitely is not an integral part of his healing along with the hyperbarics. Subsequently I discussed with the patient today that I do believe he would benefit going ahead and proceeding with evaluation here in the clinic for that reason I did go ahead and see him today to get things started. There is also been some confusion about the issues with his home health nurse and getting measurements to [facility] for the wound VAC in order to continue his wound VAC therapy. With that being said after I see him today we will make a determination on what to do with wound VAC we can definitely send measurements to [facility] but again the bigger question is whether this is the best way to go or not. 11/28/2021 upon evaluation today patient appears to be doing decently well in regard to his wound. He has been tolerating the dressing changes with the Dakin's moistened gauze dressing which I think is doing a good job. Fortunately I do not see any signs of active infection locally nor systemically at this point. In general I think that he is actually making excellent progress which is great news. 2/17; seen in conjunction with HBO today. 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 Much improved condition of the wound surface. On the lateral foot and eschared area that he has been using Betadine. T olerating HBO well 12/13/2021 upon evaluation today patient appears to be doing pretty well in regard to his foot ulcer. I am actually much more pleased currently that I been with the way the appearance looks today. The wound bed does show some signs of slough and biofilm on the surface of the wound this is minimal I Minna perform debridement the I will do so very carefully due to the fact that to be honest the patient had a significant amount of bleeding  in the past being on the Brilinta he had a difficult time clotting when I debrided him previously quite significantly. Fortunately I do not see any signs of active infection at this time which is great news. No fevers, chills, nausea, vomiting, or diarrhea. 12/20/2021 upon evaluation today patient actually appears to be showing some signs of excellent improvement here. I am very pleased with where things stand currently. There does not appear to be any evidence of active infection locally nor systemically which is great news and I do believe that with the hyperbaric oxygen therapy he is actually making excellent progress. 3/9; patient presents for follow-up. He continues to do HBO therapy without issues. He has completed 34 out of 40 sessions. He continues to use Dakin's wet- to-dry to the amputation site and Betadine to the lateral foot wound. he currently denies signs of infection. 01/03/2022 upon evaluation today patient appears to be doing better in regard to his distal foot ulcer. Unfortunately the lateral foot is a different story this area has softened up and is getting need to be debrided away today. I [name] Peer do this with scissors and forceps and was able to clear that out that will be detailed below. Otherwise I feel like the Dakin's moistened gauze dressing is doing an awesome job for him and he is  making significantly improvements here. 01/10/2022 upon evaluation today patient continues to have some of the necrotic tissue loosen up on the lateral portion of his foot the main wound that we have been treating is actually doing quite well. Overall very pleased with where things stand in general. No fevers, chills, nausea, vomiting, or diarrhea. 01/17/2022 upon evaluation today patient appears to be doing somewhat poorly in regard to the lateral foot wound. The main wound that we initially were seeing him for looks to be doing great. This lateral foot has worsened and unfortunately it appears to  potentially have some signs of infection here. I do believe that we may need to see about getting started on some IV antibiotics he is in dialysis so they could potentially do this for him, Monday. Nonetheless I want to try to get that started ASAP. Again I would prefer him be initiated with IV vancomycin and cefepime just as an empiric broad-spectrum until we can get the culture results back at least. Subsequently also think that this should be done sooner rather than later due to the fact that his leg is more swollen and overall this seems to have progressed fairly rapidly since last week I do not want this to get worse. He voiced understanding. 01-24-2022 upon evaluation today patient appears to be doing well with regard to his main ulcer that we have been seeing him for. I am very pleased with where things stand currently. He still continues to have bleeding if we do any type of aggressive sharp debridement there for him having to be very careful using Dakin's solution along with debridement as I can here in the office. Overall the patient seems to be doing well. Unfortunately the lateral foot wound still has progressed a little bit further. We did get him on antibiotics starting this past Monday I think the drainage looks much better and overall I am seeing improvement but again this was already broken down to a certain degree I am hopeful by next week this will really start turnaround for [place]. We will also get a send him for an x-ray to evaluate for any signs of osteomyelitis on this lateral foot. 01-31-2022 upon evaluation today patient's wound on the medial left amputation site is actually doing excellent. I am very pleased with where things stand this is significantly smaller and we are headed in the right direction. In regard to the left lateral foot there is still some necrotic tissue to be cleared away although I see evidence of this starting to basically solidify as far as the edges are  concerned with how far it spread I do not see anything seeming to want to spread further. I believe this may be close to done as far as any evolving is concerned. 02-07-2022 upon evaluation today patient appears to be doing well with regard to the main amputation site. Unfortunately the secondary site on the left lateral foot is still giving him some trouble here. There is a lot of dried tissue and I do believe that along with the Dakin's we may want to use a contact layer in the base of the wound to try to prevent the bone from drying out because we are down to that point at this time. The patient voiced understanding at this point as well and is happy to do what ever it takes to try to get this better. With that being said I do believe that it is possible he may benefit from a wound VAC. Again  the question is the approval process for getting this done obviously we can see what we can do if we get things cleaned a little bit better but right now I think the best option is going to be for [place] to continue with the packing currently. 02-14-2022 upon evaluation today patient presents for follow-up concerning ongoing issues with his foot. He did have the MRI which does show that he has evidence of osteomyelitis of the digit at the fifth location of his foot. Subsequently this does extend down to the base of the digit and into the proximal portion of the metatarsal as well. With that being said the bone does not appear to be too severely broken down and a lot of the marrow signal is stated to be maintained which is good news. Nonetheless I do believe that he is unfortunately still in a spot where this is precarious and we need to make sure that we are treating this appropriately. He does seem to be doing well with IV antibiotics he also seems to be doing quite well when it comes to the overall appearance of the wound bed at this point. I am happy in that regard. 02-21-2022 upon evaluation today patient appears  to be doing well all things considered in regard to his wounds. I do feel like that he is making some progress here. The wound on the right distal foot where the amputation site is is actually healing nicely I feel like we can switch over to collagen at this point that should do well for him. In regard to the left lateral foot this is actually getting much cleaner he still has some area where there seems to be some pressure getting to the location I think that he would do well with a knee scooter at this point. I discussed that with him he does not seem prone to going that route at this point he understands it can definitely help. 02-28-2022 upon evaluation today patient appears to be doing well currently with regard to the wound on his foot. The main surgical wound is actually significantly improved and the lateral wound is doing better there is still bone exposed but nonetheless this does not appear to be significantly worse than what we have noted previous. In fact I think it looks better than it did last week. My goal is to keep the bone from drying out and subsequently for that reason we are going to go ahead and proceed with the EpiFix which I think is good to be a good option here for him. Over top of the EpiFix we will do a contact layer in order to hopefully keep things from drying out. This dressing will actually stay in place until he comes back. Patient did see vascular yesterday and states that they did not feel like anything was doing worse still it appears that if it were left up to them they think the ideal thing would be to proceed with an amputation. I explained to the patient that to be honest is not always avoidable but if we can try to avoid amputation that is what we are after. He voiced understanding and he does want to do what ever he can to try to prevent amputation for that reason we are proceeding with the epi cord today. This will be application #1. 0000000 upon evaluation  today patient appears to be making some progress here regard to the lateral foot. Fortunately there does not appear to be any signs of active  infection locally or systemically which is great news. No fevers, chills, nausea, vomiting, or diarrhea. I do think that he is appropriate for second application of EpiCord today. 03-14-2022 upon evaluation today patient's wound still has significant exposure of the bone at this point. He did get crutches he has been trying to use those the past couple of days am not sure that is going so well to be perfectly honest. We did discuss however offloading is important and we even discussed a total contact cast but to be perfectly honest right now and feels like he may need more debridement to clear away some of the bone which is not viable in the end of the foot of the fifth metatarsal but I am unable to really do that here in the office due to his bleeding. I do not know if potentially we could have him seen by podiatry potentially Dr. Merril Abbe to see if there is anything he can do to help in this regard. I am not even certain whether the majority of the [place], [place] L ([id number]) [phone].pdf Page 3 of 9 metatarsal is even viable to be honest. Either way I do think that he would benefit from surgical debridement or even possibly partial ray amputation in order for [place] to try to keep things moving in the right direction and get this closed. This was all discussed with the patient today. With that being said I know he wants to avoid a below-knee amputation which is what has previously been recommended. 03-21-2022 upon evaluation today patient appears to be doing well in regard to the medial wound at this is shown signs of excellent improvement. In regard to the lateral wound this is a bit dry compared to what I want to see him [name] not draining as much but I think that we need to see what we can do about trying to keep this from drying out  and keeping it a bit more moist. Patient voiced understanding. Overall I am very pleased otherwise with where we stand. 03-27-2022 upon evaluation today patient appears to be doing well currently in regard to his wound especially on the medial aspect this looks really good the lateral aspect is looking a little bit better to with some healthier appearance to the bone compared to last week to be honest. Fortunately I do not see any signs of active infection locally or systemically at this time which is great news. No fevers, chills, nausea, vomiting, or diarrhea. READMISSION 08/28/2022 This is a 57 year old man who is a diabetic on dialysis but not currently on any treatment for it. He has a known history of PAD. He was in this clinic for a protracted period of time this year for a left foot wound with underlying osteomyelitis for which she was treated with hyperbaric oxygen. As I understand things he left the clinic in June with wounds and an unhealed state possibly infected. 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 Unfortunately in September he required a revision of the BKA but he was ultimately admitted to [facility] for a protracted period with bacteremia secondary to group A strep from 9/12-10/12. [name] Kitchen He is recently completed vancomycin he was taking at dialysis He has been left with a nonhealing surgical wound on his left BKA amputation site. He recently followed with vascular surgery who recommended Santyl and Vashe solution which they have been applying to the  wound every day. It is noted that he does have severe PAD in the right lower extremity that is status post transmetatarsal amputation he is going to follow him up in 3 months. He is also seen infectious disease at [facility] who state that he had MRSA treated with cefazolin and vancomycin at hemodialysis. However he is listed as having an allergic reaction to cefazolin including lip  swelling therefore he completed vancomycin. He is not felt to require any additional antibiotics. 09-04-2022 upon evaluation today patient appears to be doing well currently in regard to his wound on the medial aspect of this is amputation site where he had a BKA. On the lateral aspect however he has an area of the incision line that is open that actually goes somewhat deep. We did probe about 4 cm down although I do not feel any bone directly. Fortunately there does not appear to be any signs of active infection locally or systemically at this time. There is some concern here in my estimation about the possibility of infection and I think that he may need some extension of the antibiotics I [name] probably put him on linezolid as he was previously on vancomycin. 09-16-2022 upon evaluation today patient appears to be doing well currently in regard to his wound. There is good to be some need for sharp debridement today but fortunately nothing too significant. Fortunately I do not see any signs of active infection locally nor systemically at this time which is great news. No fevers, chills, nausea, vomiting, or diarrhea. 09-23-2022 upon evaluation today patient appears to be doing well currently in regard to his wounds that were still in the cleaning up phase in regard to the primary wound where he dehisced. There is a lot of necrotic tissue that is can have to be cleaned out working on that at this point. 09-30-2022 upon evaluation today patient appears to be doing well currently in regard to his wounds. He is slowly making progress and getting the main open area on the medial aspect of his incision line in a much healthier state. In general I feel like that we are headed in the right direction. I do not see any signs of active infection at this time. 10-07-2022 upon evaluation today patient appears to be doing well currently in regard to his wound. He has been tolerating the dressing changes  without complication of the lateral portion of the wound actually is closed the more medial portion is doing much better the swelling overall is excellent. He has been using the Tubigrip as a compression on the end of the stump and that has done extremely well for him. 12/26; unfortunately the patient's wound on the left BKA stump has a deeper area from 6-9 o'clock. There is not been any drainage she is not complaining of any pain. He states that he has been up in his wheelchair with his stump dependent quite a bit. This might account for why the edema in the stump seems out of control. 10-23-2022 upon evaluation today patient appears to be doing well currently in regard to his wound. This is actually showing signs of improvement I do believe he would benefit from the possibility of a snap VAC. Again we will get a check into his insurance to see if we can get approval. If we are able to that would definitely benefit him I do believe. Subsequently I will get him to look into this when she gets back in the office come Monday. 10-30-2022 upon  evaluation today patient appears to be doing well in regard to his wound he still has some depth here but fortunately there does not appear to be any signs of active infection locally nor systemically which is great news. No fevers, chills, nausea, vomiting, or diarrhea. Electronic Signature(s) Signed: 10/30/2022 1:37:12 PM By: Worthy Keeler PA-C Entered By: Worthy Keeler on 10/30/2022 13:37:12 -------------------------------------------------------------------------------- Physical Exam Details Patient Name: Date of Service: [name] MES L. 10/30/2022 12:45 PM Medical Record Number: [id number] Patient Account Number: [id number] Date of Birth/Sex: Treating RN: [birth year] (57 y.o. [name] Primary Care Provider: [name] Other Clinician: Referring Provider: Treating Provider/Extender: [name] in Treatment: [address], [place] ([id number]) [phone].pdf Page 4 of 9 Constitutional Well-nourished and well-hydrated in no acute distress. Respiratory normal breathing without difficulty. Psychiatric this patient is able to make decisions and demonstrates good insight into disease process. Alert and Oriented x 3. pleasant and cooperative. Notes Upon inspection patient's wound bed actually showed signs of good granulation epithelization at this point. Fortunately I see no evidence of infection at this time which is great news and overall I do believe that we are headed in the right direction. With that being said I think a snap VAC could be beneficial for this patient but again we are holding on ensuring the approval and everything is good going forward he also has home health and that will be discontinued this week and then we can proceed with the snap VAC next week if indeed it looks like that would still be beneficial. Electronic Signature(s) Signed: 10/30/2022 1:37:56 PM By: Worthy Keeler PA-C Entered By: Worthy Keeler on 10/30/2022 13:37:56 -------------------------------------------------------------------------------- Physician Orders Details Patient Name: Date of Service: [name], [name] MES L. 10/30/2022 12:45 PM Medical Record Number: [id number] Patient Account Number: [id number] Date of Birth/Sex: Treating RN: 07-07-1966 (57 y.o. [name]) [name] Primary Care Provider: [name] Other Clinician: Referring Provider: Treating Provider/Extender: [name] in Treatment: 9 Verbal / Phone Orders: No Diagnosis Coding ICD-10 Coding Code Description [phone] Non-pressure chronic ulcer of other part of left lower leg with other specified severity Z89.512 Acquired absence of left leg below knee E11.51 Type 2 diabetes mellitus with diabetic peripheral angiopathy without gangrene L03.116 Cellulitis of left lower limb Follow-up Appointments Return Appointment in 1  week. [place] for wound care. May utilize formulary equivalent dressing for wound treatment orders unless otherwise specified. Home Health Nurse may visit PRN to address patients wound care needs. Jackquline [place] [phone] Bathing/ Shower/ Hygiene May shower; gently cleanse wound with antibacterial soap, rinse and pat dry prior to dressing wounds Edema Control - Lymphedema / Segmental Compressive Device / Other Tubigrip double layer applied - to stump and above knee Wound Treatment Wound #3 - Amputation Site - Below Knee Wound Laterality: Left Prim Dressing: Silvercel 4 1/4x 4 1/4 (in/in) 1 x Per Day/30 Days ary Discharge Instructions: Apply Silvercel 4 1/4x 4 1/4 (in/in) as instructed Secondary Dressing: Gauze 1 x Per Day/30 Days Secured With: Medipore T - 46M Medipore H Soft Cloth Surgical T ape ape, 2x2 (in/yd) 1 x Per Day/30 Days [name], [name] ([id number]) [id number] Page 5 of 9 Secured With: Tubigrip Size D, 3x10 (in/yd) 1 x Per Day/30 Days Discharge Instructions: double layer to thigh Electronic Signature(s) Signed: 10/30/2022 3:13:07 PM By: [name] RN Signed: 10/30/2022 5:05:26 PM By: Worthy Keeler PA-C Entered By: [name] on 10/30/2022 13:07:57 --------------------------------------------------------------------------------  Problem List Details Patient Name: Date of Service: [name] MES L. 10/30/2022 12:45 PM Medical Record Number: [id number] Patient Account Number: [id number] Date of Birth/Sex: Treating RN: November 25, 1965 (57 y.o. [name]) [name] Primary Care Provider: [name] Other Clinician: Referring Provider: Treating Provider/Extender: [name] in Treatment: 9 Active Problems ICD-10 Encounter Code Description Active Date MDM Diagnosis [phone] Non-pressure chronic ulcer of other part of left lower leg with other specified 08/28/2022 No Yes severity Z89.512 Acquired absence of  left leg below knee 08/28/2022 No Yes E11.51 Type 2 diabetes mellitus with diabetic peripheral angiopathy without gangrene 08/28/2022 No Yes L03.116 Cellulitis of left lower limb 08/28/2022 No Yes Inactive Problems Resolved Problems Electronic Signature(s) Signed: 10/30/2022 12:54:35 PM By: Worthy Keeler PA-C Entered By: Worthy Keeler on 10/30/2022 12:54:35 [place], [name] ([id number]) [id number] Page 6 of 9 -------------------------------------------------------------------------------- Progress Note Details Patient Name: Date of Service: [name] MES L. 10/30/2022 12:45 PM Medical Record Number: [id number] Patient Account Number: [id number] Date of Birth/Sex: Treating RN: 28-Dec-1965 (57 y.o. 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 He is actually being evaluated for hyperbaric oxygen therapy only. He has been seen at [facility] up to this point he is under the care of the vascular surgery specialty. Subsequently he is also under the care of the hyperbaric center there. With that being said that due to the fact that he is a dialysis patient he was actually recommended to be transferred to [place] for his treatments he actually lives in [place] which is where his dialysis is. It was a hardship for him to try to get from [place] especially on dialysis days all the way to [facility] get into the facility for his treatment and get back home he was literally spending all day long. He has had just 4 treatments there at [facility] thus far based on what I see in these have not been continued while he awaits  approval here in our clinic. Subsequently I do have records for review that will be included in the HPI predetermination review and attached to this chart as well. I will not duplicate that here. 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 Nonetheless he continues to have exposed bone with marked soft tissue swelling compatible with osteomyelitis. This is due to the irregularity of the head of the metatarsal as well. Nonetheless along with having associated cellulitis he is also good to be seen by infectious disease and is currently on antibiotics for this as well. Again that will be detailed in the pretreatment review section which I will attach to this note as well. He does currently have a wound VAC in place and again the wound was not evaluated here in the clinic as that is being completely managed by home health and [facility]. The patient does have a history of diabetes mellitus type 2, hypertension, coronary artery disease, and congestive heart failure. He also has cataracts of both eyes but no evidence of glaucoma on his most recent eye exam. 11/22/2021 we have  been seeing this gentleman for hyperbaric oxygen therapy but have not actually evaluated his wound up to this point this was being managed by the wound care and vascular team I presumed at [facility] at least that is what has been told to be previous. Nonetheless at this point I got a call from Atlee Abide who is a Librarian, academic at the [facility] vascular clinic with Dr. Durene Fruits and subsequently they were wanting to know if I could take over wound care for this patient. Apparently they do not have the ability for an office debridement which again I completely understand but nonetheless definitely is not an integral part of his healing along with the hyperbarics. Subsequently I discussed  with the patient today that I do believe he would benefit going ahead and proceeding with evaluation here in the clinic for that reason I did go ahead and see him today to get things started. There is also been some confusion about the issues with his home health nurse and getting measurements to [facility] for the wound VAC in order to continue his wound VAC therapy. With that being said after I see him today we will make a determination on what to do with wound VAC we can definitely send measurements to [name] but again the bigger question is whether this is the best way to go or not. 11/28/2021 upon evaluation today patient appears to be doing decently well in regard to his wound. He has been tolerating the dressing changes with the Dakin's moistened gauze dressing which I think is doing a good job. Fortunately I do not see any signs of active infection locally nor systemically at this point. In general I think that he is actually making excellent progress which is great news. 2/17; seen in conjunction with HBO today. 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 Much improved condition of the wound surface. On the lateral foot and eschared area that he has been using Betadine. T olerating HBO well 12/13/2021 upon evaluation today patient appears to be doing pretty well in regard to his foot ulcer. I am actually much more pleased currently that I been with the way the appearance looks today. The wound bed does show some signs of slough and biofilm on the surface of the wound this is minimal I Minna perform debridement the I will do so very carefully due to the fact that to be honest the patient had a significant amount of bleeding in the past being on the Brilinta he had a difficult time clotting when I debrided him previously quite significantly. Fortunately I do not see any signs of active infection at this time which is great news. No fevers, chills, nausea, vomiting, or  diarrhea. 12/20/2021 upon evaluation today patient actually appears to be showing some signs of excellent improvement here. I am very pleased with where things stand currently. There does not appear to be any evidence of active infection locally nor systemically which is great news and I do believe that with the hyperbaric oxygen therapy he is actually making excellent progress. 3/9; patient presents for follow-up. He continues to do HBO therapy without issues. He has completed 34 out of 40 sessions. He continues to use Dakin's wet- to-dry to the amputation site and Betadine to the lateral foot wound. he currently denies signs of infection. 01/03/2022 upon evaluation today patient appears to be doing better in regard to his distal foot ulcer. Unfortunately the lateral foot is a different  story this area has softened up and is getting need to be debrided away today. I [name] Peer do this with scissors and forceps and was able to clear that out that will be detailed below. Otherwise I feel like the Dakin's moistened gauze dressing is doing an awesome job for him and he is making significantly improvements here. 01/10/2022 upon evaluation today patient continues to have some of the necrotic tissue loosen up on the lateral portion of his foot the main wound that we have been treating is actually doing quite well. Overall very pleased with where things stand in general. No fevers, chills, nausea, vomiting, or diarrhea. 01/17/2022 upon evaluation today patient appears to be doing somewhat poorly in regard to the lateral foot wound. The main wound that we initially were seeing him for looks to be doing great. This lateral foot has worsened and unfortunately it appears to potentially have some signs of infection here. I do believe that we may need to see about getting started on some IV antibiotics he is in dialysis so they could potentially do this for him, Monday. Nonetheless I want to try to get that started ASAP.  Again I would prefer him be initiated with IV vancomycin and cefepime just as an empiric broad-spectrum until we can get the culture results back at least. Subsequently also think that this should be done sooner rather than later due to the fact that his leg is more swollen and overall this seems to have progressed fairly rapidly since last week I do not want this to get worse. He voiced understanding. JUNIEL, GROENE ([id number]) [id number] Page 7 of 9 01-24-2022 upon evaluation today patient appears to be doing well with regard to his main ulcer that we have been seeing him for. I am very pleased with where things stand currently. He still continues to have bleeding if we do any type of aggressive sharp debridement there for him having to be very careful using Dakin's solution along with debridement as I can here in the office. Overall the patient seems to be doing well. Unfortunately the lateral foot wound still has progressed a little bit further. We did get him on antibiotics starting this past Monday I think the drainage looks much better and overall I am seeing improvement but again this was already broken down to a certain degree I am hopeful by next week this will really start turnaround for [place]. We will also get a send him for an x-ray to evaluate for any signs of osteomyelitis on this lateral foot. 01-31-2022 upon evaluation today patient's wound on the medial left amputation site is actually doing excellent. I am very pleased with where things stand this is significantly smaller and we are headed in the right direction. In regard to the left lateral foot there is still some necrotic tissue to be cleared away although I see evidence of this starting to basically solidify as far as the edges are concerned with how far it spread I do not see anything seeming to want to spread further. I believe this may be close to done as far as any evolving is concerned. 02-07-2022 upon  evaluation today patient appears to be doing well with regard to the main amputation site. Unfortunately the secondary site on the left lateral foot is still giving him some trouble here. There is a lot of dried tissue and I do believe that along with the Dakin's we may want to use a contact layer in the base of the  wound to try to prevent the bone from drying out because we are down to that point at this time. The patient voiced understanding at this point as well and is happy to do what ever it takes to try to get this better. With that being said I do believe that it is possible he may benefit from a wound VAC. Again the question is the approval process for getting this done obviously we can see what we can do if we get things cleaned a little bit better but right now I think the best option is going to be for [place] to continue with the packing currently. 02-14-2022 upon evaluation today patient presents for follow-up concerning ongoing issues with his foot. He did have the MRI which does show that he has evidence of osteomyelitis of the digit at the fifth location of his foot. Subsequently this does extend down to the base of the digit and into the proximal portion of the metatarsal as well. With that being said the bone does not appear to be too severely broken down and a lot of the marrow signal is stated to be maintained which is good news. Nonetheless I do believe that he is unfortunately still in a spot where this is precarious and we need to make sure that we are treating this appropriately. He does seem to be doing well with IV antibiotics he also seems to be doing quite well when it comes to the overall appearance of the wound bed at this point. I am happy in that regard. 02-21-2022 upon evaluation today patient appears to be doing well all things considered in regard to his wounds. I do feel like that he is making some progress here. The wound on the right distal foot where the amputation site is  is actually healing nicely I feel like we can switch over to collagen at this point that should do well for him. In regard to the left lateral foot this is actually getting much cleaner he still has some area where there seems to be some pressure getting to the location I think that he would do well with a knee scooter at this point. I discussed that with him he does not seem prone to going that route at this point he understands it can definitely help. 02-28-2022 upon evaluation today patient appears to be doing well currently with regard to the wound on his foot. The main surgical wound is actually significantly improved and the lateral wound is doing better there is still bone exposed but nonetheless this does not appear to be significantly worse than what we have noted previous. In fact I think it looks better than it did last week. My goal is to keep the bone from drying out and subsequently for that reason we are going to go ahead and proceed with the EpiFix which I think is good to be a good option here for him. Over top of the EpiFix we will do a contact layer in order to hopefully keep things from drying out. This dressing will actually stay in place until he comes back. Patient did see vascular yesterday and states that they did not feel like anything was doing worse still it appears that if it were left up to them they think the ideal thing would be to proceed with an amputation. I explained to the patient that to be honest is not always avoidable but if we can try to avoid amputation that is what we are after.  He voiced understanding and he does want to do what ever he can to try to prevent amputation for that reason we are proceeding with the epi cord today. This will be application #1. [id number] upon evaluation today patient appears to be making some progress here regard to the lateral foot. Fortunately there does not appear to be any signs of active infection locally or systemically which  is great news. No fevers, chills, nausea, vomiting, or diarrhea. I do think that he is appropriate for second application of EpiCord today. 03-14-2022 upon evaluation today patient's wound still has significant exposure of the bone at this point. He did get crutches he has been trying to use those the past couple of days am not sure that is going so well to be perfectly honest. We did discuss however offloading is important and we even discussed a total contact cast but to be perfectly honest right now and feels like he may need more debridement to clear away some of the bone which is not viable in the end of the foot of the fifth metatarsal but I am unable to really do that here in the office due to his bleeding. I do not know if potentially we could have him seen by podiatry potentially Dr. Merril Abbe to see if there is anything he can do to help in this regard. I am not even certain whether the majority of the metatarsal is even viable to be honest. Either way I do think that he would benefit from surgical debridement or even possibly partial ray amputation in order for [place] to try to keep things moving in the right direction and get this closed. This was all discussed with the patient today. With that being said I know he wants to avoid a below-knee amputation which is what has previously been recommended. 03-21-2022 upon evaluation today patient appears to be doing well in regard to the medial wound at this is shown signs of excellent improvement. In regard to the lateral wound this is a bit dry compared to what I want to see him [name] not draining as much but I think that we need to see what we can do about trying to keep this from drying out and keeping it a bit more moist. Patient voiced understanding. Overall I am very pleased otherwise with where we stand. 03-27-2022 upon evaluation today patient appears to be doing well currently in regard to his wound especially on the medial aspect this looks  really good the lateral aspect is looking a little bit better to with some healthier appearance to the bone compared to last week to be honest. Fortunately I do not see any signs of active infection locally or systemically at this time which is great news. No fevers, chills, nausea, vomiting, or diarrhea. READMISSION 08/28/2022 This is a 57 year old man who is a diabetic on dialysis but not currently on any treatment for it. He has a known history of PAD. He was in this clinic for a protracted period of time this year for a left foot wound with underlying osteomyelitis for which she was treated with hyperbaric oxygen. As I understand things he left the clinic in June with wounds and an unhealed state possibly infected. 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 Unfortunately in September he required a revision of the BKA but he was ultimately admitted to [facility] for a protracted period  with bacteremia secondary to group A strep from 9/12-10/12. [name] Kitchen He is recently completed vancomycin he was taking at dialysis He has been left with a nonhealing surgical wound on his left BKA amputation site. He recently followed with vascular surgery who recommended Santyl and Vashe solution which they have been applying to the wound every day. It is noted that he does have severe PAD in the right lower extremity that is status post transmetatarsal amputation he is going to follow him up in 3 months. He is also seen infectious disease at [facility] who state that he had MRSA treated with cefazolin and vancomycin at hemodialysis. However he is listed as having an allergic reaction to cefazolin including lip swelling therefore he completed vancomycin. He is not felt to require any additional antibiotics. 09-04-2022 upon evaluation today patient appears to be doing well currently in regard to his wound on the medial aspect of this is amputation site where he had a  BKA. On the lateral aspect however he has an area of the incision line that is open that actually goes somewhat deep. We did probe about 4 cm down although I do not feel any bone directly. Fortunately there does not appear to be any signs of active infection locally or systemically at this time. There is some concern here in my estimation about the possibility of infection and I think that he may need some extension of the antibiotics I [name] probably put him on linezolid as he was previously on vancomycin. 09-16-2022 upon evaluation today patient appears to be doing well currently in regard to his wound. There is good to be some need for sharp debridement today but fortunately nothing too significant. Fortunately I do not see any signs of active infection locally nor systemically at this time which is great news. No fevers, chills, nausea, vomiting, or diarrhea. 09-23-2022 upon evaluation today patient appears to be doing well currently in regard to his wounds that were still in the cleaning up phase in regard to the primary wound where he dehisced. There is a lot of necrotic tissue that is can have to be cleaned out working on that at this point. 09-30-2022 upon evaluation today patient appears to be doing well currently in regard to his wounds. He is slowly making progress and getting the main open [place], [place] L ([id number]) [phone].pdf Page 8 of 9 area on the medial aspect of his incision line in a much healthier state. In general I feel like that we are headed in the right direction. I do not see any signs of active infection at this time. 10-07-2022 upon evaluation today patient appears to be doing well currently in regard to his wound. He has been tolerating the dressing changes without complication of the lateral portion of the wound actually is closed the more medial portion is doing much better the swelling overall is excellent. He has been using the Tubigrip as a  compression on the end of the stump and that has done extremely well for him. 12/26; unfortunately the patient's wound on the left BKA stump has a deeper area from 6-9 o'clock. There is not been any drainage she is not complaining of any pain. He states that he has been up in his wheelchair with his stump dependent quite a bit. This might account for why the edema in the stump seems out of control. 10-23-2022 upon evaluation today patient appears to be doing well currently in regard to his wound. This is actually showing signs of  improvement I do believe he would benefit from the possibility of a snap VAC. Again we will get a check into his insurance to see if we can get approval. If we are able to that would definitely benefit him I do believe. Subsequently I will get him to look into this when she gets back in the office come Monday. 10-30-2022 upon evaluation today patient appears to be doing well in regard to his wound he still has some depth here but fortunately there does not appear to be any signs of active infection locally nor systemically which is great news. No fevers, chills, nausea, vomiting, or diarrhea. Objective Constitutional Well-nourished and well-hydrated in no acute distress. Vitals Time Taken: 12:43 PM, Height: 70 in, Weight: 166 lbs, BMI: 23.8, Temperature: 98.1 F, Pulse: 64 bpm, Respiratory Rate: 18 breaths/min, Blood Pressure: 181/96 mmHg. Respiratory normal breathing without difficulty. Psychiatric this patient is able to make decisions and demonstrates good insight into disease process. Alert and Oriented x 3. pleasant and cooperative. General Notes: Upon inspection patient's wound bed actually showed signs of good granulation epithelization at this point. Fortunately I see no evidence of infection at this time which is great news and overall I do believe that we are headed in the right direction. With that being said I think a snap VAC could be beneficial for this  patient but again we are holding on ensuring the approval and everything is good going forward he also has home health and that will be discontinued this week and then we can proceed with the snap VAC next week if indeed it looks like that would still be beneficial. Integumentary (Hair, Skin) Wound #3 status is Open. Original cause of wound was Surgical Injury. The date acquired was: 07/19/2022. The wound has been in treatment 9 weeks. 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 The wound measures 0.5cm length x 1.2cm width x 0.7cm depth; 0.471cm^2 area and 0.33cm^3 volume. There is Fat Layer (Subcutaneous Tissue) exposed. There is no tunneling or undermining noted. There is a medium amount of serosanguineous drainage noted. There is medium (34-66%) pink granulation within the wound bed. There is a medium (34-66%) amount of necrotic tissue within the wound bed including Adherent Slough. 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 Home Health: [facility] for wound care. May utilize formulary equivalent dressing for wound treatment orders unless otherwise specified. Home Health Nurse may visit PRN to address patientoos wound care needs. Jackquline Denmark [phone] Bathing/ Shower/ Hygiene: May shower; gently cleanse wound with antibacterial soap, rinse and pat dry prior to dressing wounds Edema Control - Lymphedema / Segmental Compressive Device / Other: Tubigrip double layer applied - to stump and above knee WOUND #3: - Amputation Site - Below Knee Wound Laterality: Left Prim Dressing: Silvercel 4 1/4x 4 1/4 (in/in) 1 x Per Day/30 Days ary Discharge Instructions: Apply Silvercel 4  1/4x 4 1/4 (in/in) as instructed Secondary Dressing: Gauze 1 x Per Day/30 Days Secured With: Medipore T - 16M Medipore H Soft Cloth Surgical T ape ape, 2x2 (in/yd) 1 x Per Day/30 Days GARVIS, DOWNUM ([id number]) [id number] Page 9 of 9 Secured With: Tubigrip Size D, 3x10 (in/yd) 1 x Per Day/30 Days Discharge Instructions: double layer to  thigh 1. I am going to recommend that we have the patient continue for now with the silver alginate dressing which I think has been doing a really good job here. 2. I am also can recommend that we the patient continue with the dressing changes 3 times per week which I think is appropriate. He is also using the Tubigrip as a stump shrinker which has done excellent for him. 3. He should also continue to elevate his leg has been doing a really good job with this which is great news. We will see patient back for reevaluation in 1 week here in the clinic. If anything worsens or changes patient will contact our office for additional recommendations. Electronic Signature(s) Signed: 10/30/2022 1:38:38 PM By: Worthy Keeler PA-C Entered By: Worthy Keeler on 10/30/2022 13:38:38 -------------------------------------------------------------------------------- SuperBill Details Patient Name: Date of Service: [name], Fowler MES L. 10/30/2022 Medical Record Number: [id number] Patient Account Number: [id number] Date of Birth/Sex: Treating RN: 1966-01-04 (57 y.o. [name]) [name] Primary Care Provider: [name] Other Clinician: Referring Provider: Treating Provider/Extender: [name] in Treatment: 9 Diagnosis Coding ICD-10 Codes Code Description [phone] Non-pressure chronic ulcer of other part of left lower leg with other specified severity Z89.512 Acquired absence of left leg below knee E11.51 Type 2 diabetes mellitus with diabetic peripheral angiopathy without gangrene L03.116 Cellulitis of left lower limb Facility  Procedures : CPT4 Code: 10272536 Description: 64403 - WOUND CARE VISIT-LEV 2 EST PT Modifier: Quantity: 1 Physician Procedures : CPT4 Code Description Modifier [id number] 63875 - WC PHYS LEVEL 3 - EST PT ICD-10 Diagnosis Description L97.828 Non-pressure chronic ulcer of other part of left lower leg with other specified severity Z89.512 Acquired absence of left leg below knee E11.51  Type 2 diabetes mellitus with diabetic peripheral angiopathy without gangrene L03.116 Cellulitis of left lower limb Quantity: 1 Electronic Signature(s) Signed: 10/30/2022 1:38:54 PM By: Worthy Keeler PA-C Previous Signature: 10/30/2022 1:27:25 PM Version By: [name] RN Entered By: Worthy Keeler on 10/30/2022 13:38:54

## 2022-10-30 NOTE — Progress Notes (Signed)
[place], [place] L ([id number]) [id number] Page 1 of 9 Visit Report for 10/30/2022 Arrival Information Details Patient Name: Date of Service: [name] MES L. 10/30/2022 12:45 PM Medical Record Number: [id number] Patient Account Number: [id number] Date of Birth/Sex: Treating RN: 13-Apr-1966 (57 y.o. [name]) Dolores Lory, Morey Hummingbird Primary Care Bevin Mayall: [name] Other Clinician: Referring Viki Carrera: Treating Karrie Fluellen/Extender: [name] in Treatment: 9 Visit Information History Since Last Visit Added or deleted any medications: No Patient Arrived: Wheel Chair Any new allergies or adverse reactions: No Arrival Time: 12:38 Had a fall or experienced change in No Accompanied By: wife activities of daily living that may affect Transfer Assistance: None risk of falls: Patient Identification Verified: Yes Signs or symptoms of abuse/neglect since last visito No Secondary Verification Process Completed: Yes Hospitalized since last visit: No Patient Requires Transmission-Based Precautions: No Implantable device outside of the clinic excluding No Patient Has Alerts: No cellular tissue based products placed in the center since last visit: Has Dressing in Place as Prescribed: Yes Pain Present Now: No Electronic Signature(s) Signed: 10/30/2022 3:13:07 PM By: [name] RN Entered By: [name] on 10/30/2022 12:42:58 -------------------------------------------------------------------------------- Clinic Level of Care Assessment Details Patient Name: Date of Service: [name] MES L. 10/30/2022 12:45 PM Medical Record Number: [id number] Patient Account Number: [id number] Date of Birth/Sex: Treating RN: [birth year] (57 y.o. [name]) [name] Primary Care Costas Sena: [name] Other Clinician: Referring Kratos Ruscitti: Treating Tajuan Dufault/Extender: [name] in Treatment: 9 Clinic Level of Care Assessment Items TOOL 4 Quantity Score X- 1  0 Use when only an EandM is performed on FOLLOW-UP visit ASSESSMENTS - Nursing Assessment / Reassessment []  - 0 Reassessment of Co-morbidities (includes updates in patient status) []  - 0 Reassessment of Adherence to Treatment Plan DISHAWN, BHARGAVA ([id number]) [phone].pdf Page 2 of 9 ASSESSMENTS - Wound and Skin A ssessment / Reassessment X - Simple Wound Assessment / Reassessment - one wound 1 5 []  - 0 Complex Wound Assessment / Reassessment - multiple wounds []  - 0 Dermatologic / Skin Assessment (not related to wound area) ASSESSMENTS - Focused Assessment []  - 0 Circumferential Edema Measurements - multi extremities []  - 0 Nutritional Assessment / Counseling / Intervention []  - 0 Lower Extremity Assessment (monofilament, tuning fork, pulses) []  - 0 Peripheral Arterial Disease Assessment (using hand held doppler) ASSESSMENTS - Ostomy and/or Continence Assessment and Care []  - 0 Incontinence Assessment and Management []  - 0 Ostomy Care Assessment and Management (repouching, etc.) PROCESS - Coordination of Care X - Simple Patient / Family Education for ongoing care 1 15 []  - 0 Complex (extensive) Patient / Family Education for ongoing care []  - 0 Staff obtains Programmer, systems, Records, T Results / Process Orders est []  - 0 Staff telephones HHA, Nursing Homes / Clarify orders / etc []  - 0 Routine Transfer to another Facility (non-emergent condition) []  - 0 Routine Hospital Admission (non-emergent condition) []  - 0 New Admissions / Biomedical engineer / Ordering NPWT Apligraf, etc. , []  - 0 Emergency Hospital Admission (emergent condition) X- 1 10 Simple Discharge Coordination []  - 0 Complex (extensive) Discharge Coordination PROCESS - Special Needs []  - 0 Pediatric / Minor Patient Management []  - 0 Isolation Patient Management []  - 0 Hearing / Language / Visual special needs []  - 0 Assessment of Community assistance (transportation, D/C  planning, etc.) []  - 0 Additional assistance / Altered mentation []  - 0 Support Surface(s) Assessment (bed, cushion, seat, etc.) INTERVENTIONS - Wound Cleansing / Measurement X - Simple  Wound Cleansing - one wound 1 5 []  - 0 Complex Wound Cleansing - multiple wounds X- 1 5 Wound Imaging (photographs - any number of wounds) []  - 0 Wound Tracing (instead of photographs) X- 1 5 Simple Wound Measurement - one wound []  - 0 Complex Wound Measurement - multiple wounds INTERVENTIONS - Wound Dressings X - Small Wound Dressing one or multiple wounds 1 10 []  - 0 Medium Wound Dressing one or multiple wounds []  - 0 Large Wound Dressing one or multiple wounds []  - 0 Application of Medications - topical []  - 0 Application of Medications - injection INTERVENTIONS - Miscellaneous []  - 0 External ear exam Cotroneo, Jaxzen L ([id number]) [phone].pdf Page 3 of 9 []  - 0 Specimen Collection (cultures, biopsies, blood, body fluids, etc.) []  - 0 Specimen(s) / Culture(s) sent or taken to Lab for analysis []  - 0 Patient Transfer (multiple staff / [name] Lift / Similar devices) []  - 0 Simple Staple / Suture removal (25 or less) []  - 0 Complex Staple / Suture removal (26 or more) []  - 0 Hypo / Hyperglycemic Management (close monitor of Blood Glucose) []  - 0 Ankle / Brachial Index (ABI) - do not check if billed separately X- 1 5 Vital Signs Has the patient been seen at the hospital within the last three years: Yes Total Score: 60 Level Of Care: New/Established - Level 2 Electronic Signature(s) Signed: 10/30/2022 3:13:07 PM By: [name] RN Entered By: [name] on 10/30/2022 13:27:19 -------------------------------------------------------------------------------- Encounter Discharge Information Details Patient Name: Date of Service: [name], [name] MES L. 10/30/2022 12:45 PM Medical Record Number: [id number] Patient Account Number: [id number] Date of Birth/Sex: Treating  RN: [birth year] (57 y.o. [name] Primary Care Devean Skoczylas: [name] Other Clinician: Referring Donnovan Stamour: Treating Maisen Klingler/Extender: [name] in Treatment: 9 Encounter Discharge Information Items Discharge Condition: Stable Ambulatory Status: Wheelchair Discharge Destination: Home Transportation: Private Auto Accompanied By: self Schedule Follow-up Appointment: Yes Clinical Summary of Care: Electronic Signature(s) Signed: 10/30/2022 1:27:58 PM By: [name] RN Entered By: [name] on 10/30/2022 13:27:58 -------------------------------------------------------------------------------- Lower Extremity Assessment Details Patient Name: Date of Service: [name], JA MES L. 10/30/2022 12:45 PM Dershem, Joyice Faster ([id number]) [phone].pdf Page 4 of 9 Medical Record Number: [id number] Patient Account Number: [id number] Date of Birth/Sex: Treating RN: Feb 23, 1966 (57 y.o. [name]) [name] Primary Care Baldemar Dady: [name] Other Clinician: Referring Basim Bartnik: Treating Chianti Goh/Extender: [name] in Treatment: 9 Electronic Signature(s) Signed: 10/30/2022 3:13:07 PM By: [name] RN Entered By: [name] on 10/30/2022 12:51:14 -------------------------------------------------------------------------------- Multi Wound Chart Details Patient Name: Date of Service: [name], [name] MES L. 10/30/2022 12:45 PM Medical Record Number: [id number] Patient Account Number: [id number] Date of Birth/Sex: Treating RN: Dec 04, 1965 (57 y.o. [name]) [name] Primary Care Nima Bamburg: [name] Other Clinician: Referring Shamiya Demeritt: Treating Crislyn Willbanks/Extender: [name] in Treatment: 9 Vital Signs Height(in): 70 Pulse(bpm): 77 Weight(lbs): 166 Blood Pressure(mmHg): 181/96 Body Mass Index(BMI): 23.8 Temperature(F): 98.1 Respiratory Rate(breaths/min): 18 [3:Photos:] [N/A:N/A] Left Amputation Site -  Below Knee N/A N/A Wound Location: Surgical Injury N/A N/A Wounding Event: Dehisced Wound N/A N/A Primary Etiology: Cataracts, Anemia, Sleep Apnea, N/A N/A Comorbid History: Arrhythmia, Congestive Heart Failure, Coronary Artery Disease, Hypertension, Myocardial Infarction, Type II Diabetes, Neuropathy, Seizure Disorder 07/19/2022 N/A N/A Date Acquired: 9 N/A N/A Weeks of Treatment: Open N/A N/A Wound Status: No N/A N/A Wound Recurrence: 0.5x1.2x0.7 N/A N/A Measurements L x W x D (cm) 0.471 N/A N/A A (cm) : rea  0.33 N/A N/A Volume (cm) : 96.50% N/A N/A % Reduction in Area: 91.90% N/A N/A % Reduction in Volume: Full Thickness Without Exposed N/A N/A Classification: Support Structures Medium N/A N/A Exudate Amount: Serosanguineous N/A N/A Exudate Type: red, brown N/A N/A Exudate Color: Medium (34-66%) N/A N/A Granulation Amount: Pink N/A N/A Granulation Quality: Medium (34-66%) N/A N/A Necrotic Amount: Muscarella, Tennis L ([id number]) [phone].pdf Page 5 of 9 Fat Layer (Subcutaneous Tissue): Yes N/A N/A Exposed Structures: Fascia: No Tendon: No Muscle: No Joint: No Bone: No None N/A N/A Epithelialization: Treatment Notes Electronic Signature(s) Signed: 10/30/2022 3:13:07 PM By: [name] RN Entered By: [name] on 10/30/2022 12:53:13 -------------------------------------------------------------------------------- Multi-Disciplinary Care Plan Details Patient Name: Date of Service: [name], [name] MES L. 10/30/2022 12:45 PM Medical Record Number: [id number] Patient Account Number: [id number] Date of Birth/Sex: Treating RN: Mar 29, 1966 (57 y.o. [name] Primary Care Annah Jasko: [name] Other Clinician: Referring Dorean Daniello: Treating Petra Sargeant/Extender: [name] in Treatment: 9 Active Inactive Wound/Skin Impairment Nursing Diagnoses: Knowledge deficit related to ulceration/compromised skin  integrity Goals: Patient/caregiver will verbalize understanding of skin care regimen Date Initiated: 08/28/2022 Target Resolution Date: 10/30/2022 Goal Status: Active Ulcer/skin breakdown will have a volume reduction of 30% by week 4 Date Initiated: 08/28/2022 Date Inactivated: 10/30/2022 Target Resolution Date: 10/17/2022 Goal Status: Met Ulcer/skin breakdown will have a volume reduction of 50% by week 8 Date Initiated: 08/28/2022 Target Resolution Date: 11/17/2022 Goal Status: Active Ulcer/skin breakdown will have a volume reduction of 80% by week 12 Date Initiated: 08/28/2022 Target Resolution Date: 12/18/2022 Goal Status: Active Ulcer/skin breakdown will heal within 14 weeks Date Initiated: 08/28/2022 Target Resolution Date: 01/16/2023 Goal Status: Active Interventions: Assess patient/caregiver ability to obtain necessary supplies Assess patient/caregiver ability to perform ulcer/skin care regimen upon admission and as needed Assess ulceration(s) every visit Notes: Electronic Signature(s) Signed: 10/30/2022 3:13:07 PM By: [name] RN Entered By: [name] on 10/30/2022 12:52:18 [place], Joyice Faster ([id number]) [id number] Page 6 of 9 -------------------------------------------------------------------------------- Pain Assessment Details Patient Name: Date of Service: [name] MES L. 10/30/2022 12:45 PM Medical Record Number: [id number] Patient Account Number: [id number] Date of Birth/Sex: Treating RN: 07-01-1966 (57 y.o. [name]) [name] Primary Care Delara Shepheard: [name] Other Clinician: Referring Aneira Cavitt: Treating Callyn Severtson/Extender: [name] in Treatment: 9 Active Problems Location of Pain Severity and Description of Pain Patient Has Paino No Site Locations Pain Management and Medication Current Pain Management: Electronic Signature(s) Signed: 10/30/2022 3:13:07 PM By: [name] RN Entered By: [name] on  10/30/2022 12:43:22 -------------------------------------------------------------------------------- Patient/Caregiver Education Details Patient Name: Date of Service: [name] MES L. 1/11/2024andnbsp12:45 PM Medical Record Number: [id number] Patient Account Number: [id number] Date of Birth/Gender: Treating RN: 1965/11/16 (57 y.o. [name] Primary Care Physician: [name] Other Clinician: Referring Physician: Treating Physician/Extender: [name] in Treatment: [address] [place] ([id number]) [id number].pdf Page 7 of 9 Education Assessment Education Provided To: Patient Education Topics Provided Wound/Skin Impairment: Methods: Explain/Verbal Responses: State content correctly Electronic Signature(s) Signed: 10/30/2022 3:13:07 PM By: [name] RN Entered By: [name] on 10/30/2022 12:51:35 -------------------------------------------------------------------------------- Wound Assessment Details Patient Name: Date of Service: [name], [name] MES L. 10/30/2022 12:45 PM Medical Record Number: [id number] Patient Account Number: [id number] Date of Birth/Sex: Treating RN: 1966-10-12 (57 y.o. [name] Primary Care Jackelyn Illingworth: [name] Other Clinician: Referring Valleri Hendricksen: Treating Tarini Carrier/Extender: [name] in Treatment: 9 Wound Status Wound Number: 3 Primary Dehisced Wound Etiology: Wound Location: Left  Amputation Site - Below Knee Wound Open Wounding Event: Surgical Injury Status: Date Acquired: 07/19/2022 Comorbid Cataracts, Anemia, Sleep Apnea, Arrhythmia, Congestive Heart Weeks Of Treatment: 9 History: Failure, Coronary Artery Disease, Hypertension, Myocardial Clustered Wound: No Infarction, Type II Diabetes, Neuropathy, Seizure Disorder Photos Wound Measurements Length: (cm) 0.5 Width: (cm) 1.2 Depth: (cm) 0.7 Area: (cm) 0.471 Volume: (cm) 0.33 % Reduction in Area: 96.5% %  Reduction in Volume: 91.9% Epithelialization: None Tunneling: No Undermining: No Wound Description Classification: Full Thickness Without Exposed Support Structures Exudate Amount: Medium Exudate Type: Serosanguineous Bucker, Sharone L ([id number]) Exudate Color: red, brown Foul Odor After Cleansing: No Slough/Fibrino Yes [phone].pdf Page 8 of 9 Wound Bed Granulation Amount: Medium (34-66%) Exposed Structure Granulation Quality: Pink Fascia Exposed: No Necrotic Amount: Medium (34-66%) Fat Layer (Subcutaneous Tissue) Exposed: Yes Necrotic Quality: Adherent Slough Tendon Exposed: No Muscle Exposed: No Joint Exposed: No Bone Exposed: No Treatment Notes Wound #3 (Amputation Site - Below Knee) Wound Laterality: Left Cleanser Peri-Wound Care Topical Primary Dressing Silvercel 4 1/4x 4 1/4 (in/in) Discharge Instruction: Apply Silvercel 4 1/4x 4 1/4 (in/in) as instructed Secondary Dressing Gauze Secured With Medipore T - 20M Medipore H Soft Cloth Surgical T ape ape, 2x2 (in/yd) Tubigrip Size D, 3x10 (in/yd) Discharge Instruction: double layer to thigh Compression Wrap Compression Stockings Add-Ons Electronic Signature(s) Signed: 10/30/2022 3:13:07 PM By: [name] RN Entered By: [name] on 10/30/2022 12:50:55 -------------------------------------------------------------------------------- Vitals Details Patient Name: Date of Service: [name], [name] MES L. 10/30/2022 12:45 PM Medical Record Number: [id number] Patient Account Number: [id number] Date of Birth/Sex: Treating RN: Jun 24, 1966 (57 y.o. [name]) [name] Primary Care Dink Creps: [name] Other Clinician: Referring Payden Bonus: Treating Tonia Avino/Extender: [name] in Treatment: 9 Vital Signs Time Taken: 12:43 Temperature (F): 98.1 Height (in): 70 Pulse (bpm): 64 Weight (lbs): 166 Respiratory Rate (breaths/min): 18 Body Mass Index (BMI): 23.8 Blood Pressure  (mmHg): 181/96 Reference Range: 80 - 120 mg / dl Electronic Signature(s) Signed: 10/30/2022 3:13:07 PM By: [name] RN Annamaria Boots, [place] L ([id number]) [id number] Page 9 of 9 Entered By: [name] on 10/30/2022 12:43:14

## 2022-11-06 ENCOUNTER — Ambulatory Visit: Payer: Medicare Other | Admitting: Physician Assistant

## 2022-11-13 ENCOUNTER — Encounter: Payer: Medicare Other | Admitting: Internal Medicine

## 2022-11-13 DIAGNOSIS — E11622 Type 2 diabetes mellitus with other skin ulcer: Secondary | ICD-10-CM | POA: Diagnosis not present

## 2022-11-13 NOTE — Progress Notes (Addendum)
[name], [name] ([id number]) [id number] Page 1 of 10 Visit Report for 11/13/2022 Debridement Details Patient Name: Date of Service: [name] MES L. 11/13/2022 2:15 PM Medical Record Number: [id number] Patient Account Number: [id number] Date of Birth/Sex: Treating RN: March 30, 1966 (57 y.o. 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 Performed By: Physician Ricard Dillon, MD Debridement Type: Chemical/Enzymatic/Mechanical Agent Used: saline gauze Level of Consciousness (Pre-procedure): Awake and Alert Pre-procedure Verification/Time Out No Taken: Instrument: Other : Saline gauze Bleeding: Minimum Hemostasis Achieved: Pressure Response to Treatment: Procedure was tolerated well Level of Consciousness (Post- Awake and Alert procedure): Post Debridement Measurements of Total Wound Length: (cm) 0.2 Width: (cm) 0.7 Depth: (cm) 0.6 Volume: (cm) 0.066 Character of Wound/Ulcer Post Debridement: Stable Post Procedure Diagnosis Same as Pre-procedure Electronic Signature(s) Signed: 11/13/2022 3:45:16 PM By: Rosalio Loud MSN RN CNS WTA Signed: 11/13/2022 4:38:56 PM By: Linton Ham MD Entered By: Rosalio Loud on 11/13/2022 15:45:16 -------------------------------------------------------------------------------- HPI Details Patient Name: Date of Service: [name], [name] MES L. 11/13/2022 2:15 PM Medical Record Number: [id number] Patient Account Number: [id number] Date of Birth/Sex: Treating RN: 05/20/1966 (56 y.o. [name] Primary Care Provider: [name] Other Clinician: Referring Provider: Treating Provider/Extender: Eldridge Dace, [place] EL [place] in Treatment: [address], [place] L ([id number])  [id number] Page 2 of 10 History of Present Illness HPI Description: 10/31/2021 patient presents today for evaluation here in our clinic. He is actually being evaluated for hyperbaric oxygen therapy only. He has been seen at [facility] up to this point he is under the care of the vascular surgery specialty. Subsequently he is also under the care of the hyperbaric center there. With that being said that due to the fact that he is a dialysis patient he was actually recommended to be transferred to [place] for his treatments he actually lives in [place] which is where his dialysis is. It was a hardship for him to try to get from [place] especially on dialysis days all the way to [facility] get into the facility for his treatment and get back home he was literally spending all day long. He has had just 4 treatments there at [facility] thus far based on what I see in these have not been continued while he awaits approval here in our clinic. Subsequently I do have records for review that will be included in the HPI predetermination review and attached to this chart as well. I will not duplicate that here. 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 Nonetheless he continues to have exposed bone with marked soft tissue swelling compatible with osteomyelitis. This is due to the irregularity of the head of the metatarsal as well. Nonetheless along with having associated cellulitis he is also good to be seen by infectious disease and is currently on antibiotics for this as well. Again that will be detailed in the pretreatment review section which I will attach to this note as well. He does currently have a wound VAC in place and again the wound was not evaluated here in the clinic as that is being completely managed by home health and  [name]. The patient does have  a history of diabetes mellitus type 2, hypertension, coronary artery disease, and congestive heart failure. He also has cataracts of both eyes but no evidence of glaucoma on his most recent eye exam. 11/22/2021 we have been seeing this gentleman for hyperbaric oxygen therapy but have not actually evaluated his wound up to this point this was being managed by the wound care and vascular team I presumed at [place] at least that is what has been told to be previous. Nonetheless at this point I got a call from Atlee Abide who is a Librarian, academic at the [facility] vascular clinic with Dr. Durene Fruits and subsequently they were wanting to know if I could take over wound care for this patient. Apparently they do not have the ability for an office debridement which again I completely understand but nonetheless definitely is not an integral part of his healing along with the hyperbarics. Subsequently I discussed with the patient today that I do believe he would benefit going ahead and proceeding with evaluation here in the clinic for that reason I did go ahead and see him today to get things started. There is also been some confusion about the issues with his home health nurse and getting measurements to [facility] for the wound VAC in order to continue his wound VAC therapy. With that being said after I see him today we will make a determination on what to do with wound VAC we can definitely send measurements to [facility] but again the bigger question is whether this is the best way to go or not. 11/28/2021 upon evaluation today patient appears to be doing decently well in regard to his wound. He has been tolerating the dressing changes with the Dakin's moistened gauze dressing which I think is doing a good job. Fortunately I do not see any signs of active infection locally nor systemically at this point. In general I think that he is actually making excellent progress which is great  news. 2/17; seen in conjunction with HBO today. 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 Much improved condition of the wound surface. On the lateral foot and eschared area that he has been using Betadine. T olerating HBO well 12/13/2021 upon evaluation today patient appears to be doing pretty well in regard to his foot ulcer. I am actually much more pleased currently that I been with the way the appearance looks today. The wound bed does show some signs of slough and biofilm on the surface of the wound this is minimal I Minna perform debridement the I will do so very carefully due to the fact that to be honest the patient had a significant amount of bleeding in the past being on the Brilinta he had a difficult time clotting when I debrided him previously quite significantly. Fortunately I do not see any signs of active infection at this time which is great news. No fevers, chills, nausea, vomiting, or diarrhea. 12/20/2021 upon evaluation today patient actually appears to be showing some signs of excellent improvement here. I am very pleased with where things stand currently. There does not appear to be any evidence of active infection locally nor systemically which is great news and I do believe that with the hyperbaric oxygen therapy he is actually making excellent progress. 3/9; patient presents for follow-up. He continues to do HBO therapy without issues. He has completed 34 out of 40 sessions. He continues to use Dakin's wet- to-dry to the amputation site and Betadine  to the lateral foot wound. he currently denies signs of infection. 01/03/2022 upon evaluation today patient appears to be doing better in regard to his distal foot ulcer. Unfortunately the lateral foot is a different story this area has softened up and is getting need to be debrided away today. I Georgina Peer do this with scissors and forceps and was able to clear that out that will be detailed below.  Otherwise I feel like the Dakin's moistened gauze dressing is doing an awesome job for him and he is making significantly improvements here. 01/10/2022 upon evaluation today patient continues to have some of the necrotic tissue loosen up on the lateral portion of his foot the main wound that we have been treating is actually doing quite well. Overall very pleased with where things stand in general. No fevers, chills, nausea, vomiting, or diarrhea. 01/17/2022 upon evaluation today patient appears to be doing somewhat poorly in regard to the lateral foot wound. The main wound that we initially were seeing him for looks to be doing great. This lateral foot has worsened and unfortunately it appears to potentially have some signs of infection here. I do believe that we may need to see about getting started on some IV antibiotics he is in dialysis so they could potentially do this for him, Monday. Nonetheless I want to try to get that started ASAP. Again I would prefer him be initiated with IV vancomycin and cefepime just as an empiric broad-spectrum until we can get the culture results back at least. Subsequently also think that this should be done sooner rather than later due to the fact that his leg is more swollen and overall this seems to have progressed fairly rapidly since last week I do not want this to get worse. He voiced understanding. 01-24-2022 upon evaluation today patient appears to be doing well with regard to his main ulcer that we have been seeing him for. I am very pleased with where things stand currently. He still continues to have bleeding if we do any type of aggressive sharp debridement there for him having to be very careful using Dakin's solution along with debridement as I can here in the office. Overall the patient seems to be doing well. Unfortunately the lateral foot wound still has progressed a little bit further. We did get him on antibiotics starting this past Monday I think the  drainage looks much better and overall I am seeing improvement but again this was already broken down to a certain degree I am hopeful by next week this will really start turnaround for [place]. We will also get a send him for an x-ray to evaluate for any signs of osteomyelitis on this lateral foot. 01-31-2022 upon evaluation today patient's wound on the medial left amputation site is actually doing excellent. I am very pleased with where things stand this is significantly smaller and we are headed in the right direction. In regard to the left lateral foot there is still some necrotic tissue to be cleared away although I see evidence of this starting to basically solidify as far as the edges are concerned with how far it spread I do not see anything seeming to want to spread further. I believe this may be close to done as far as any evolving is concerned. 02-07-2022 upon evaluation today patient appears to be doing well with regard to the main amputation site. Unfortunately the secondary site on the left lateral foot is still giving him some trouble here. There is a  lot of dried tissue and I do believe that along with the Dakin's we may want to use a contact layer in the base of the wound to try to prevent the bone from drying out because we are down to that point at this time. The patient voiced understanding at this point as well and is happy to do what ever it takes to try to get this better. With that being said I do believe that it is possible he may benefit from a wound VAC. Again the question is the approval process for getting this done obviously we can see what we can do if we get things cleaned a little bit better but right now I think the best option is going to be for [place] to continue with the packing currently. 02-14-2022 upon evaluation today patient presents for follow-up concerning ongoing issues with his foot. He did have the MRI which does show that he has evidence of osteomyelitis of the digit  at the fifth location of his foot. Subsequently this does extend down to the base of the digit and into the proximal portion of the metatarsal as well. With that being said the bone does not appear to be too severely broken down and a lot of the marrow signal is stated to be maintained which is good news. Nonetheless I do believe that he is unfortunately still in a spot where this is precarious and we need to make sure that we are treating this appropriately. He does seem to be doing well with IV antibiotics he also seems to be doing quite well when it comes to the overall appearance of the wound bed at this point. I am happy in that regard. 02-21-2022 upon evaluation today patient appears to be doing well all things considered in regard to his wounds. I do feel like that he is making some progress here. The wound on the right distal foot where the amputation site is is actually healing nicely I feel like we can switch over to collagen at this point that should do well for him. In regard to the left lateral foot this is actually getting much cleaner he still has some area where there seems to be some pressure getting to [place], [place] L ([id number]) [id number] Page 3 of 10 the location I think that he would do well with a knee scooter at this point. I discussed that with him he does not seem prone to going that route at this point he understands it can definitely help. 02-28-2022 upon evaluation today patient appears to be doing well currently with regard to the wound on his foot. The main surgical wound is actually significantly improved and the lateral wound is doing better there is still bone exposed but nonetheless this does not appear to be significantly worse than what we have noted previous. In fact I think it looks better than it did last week. My goal is to keep the bone from drying out and subsequently for that reason we are going to go ahead and proceed with the EpiFix  which I think is good to be a good option here for him. Over top of the EpiFix we will do a contact layer in order to hopefully keep things from drying out. This dressing will actually stay in place until he comes back. Patient did see vascular yesterday and states that they did not feel like anything was doing worse still it appears that if it were left up to them they think the  ideal thing would be to proceed with an amputation. I explained to the patient that to be honest is not always avoidable but if we can try to avoid amputation that is what we are after. He voiced understanding and he does want to do what ever he can to try to prevent amputation for that reason we are proceeding with the epi cord today. This will be application #1. 0000000 upon evaluation today patient appears to be making some progress here regard to the lateral foot. Fortunately there does not appear to be any signs of active infection locally or systemically which is great news. No fevers, chills, nausea, vomiting, or diarrhea. I do think that he is appropriate for second application of EpiCord today. 03-14-2022 upon evaluation today patient's wound still has significant exposure of the bone at this point. He did get crutches he has been trying to use those the past couple of days am not sure that is going so well to be perfectly honest. We did discuss however offloading is important and we even discussed a total contact cast but to be perfectly honest right now and feels like he may need more debridement to clear away some of the bone which is not viable in the end of the foot of the fifth metatarsal but I am unable to really do that here in the office due to his bleeding. I do not know if potentially we could have him seen by podiatry potentially Dr. Merril Abbe to see if there is anything he can do to help in this regard. I am not even certain whether the majority of the metatarsal is even viable to be honest. Either way  I do think that he would benefit from surgical debridement or even possibly partial ray amputation in order for [place] to try to keep things moving in the right direction and get this closed. This was all discussed with the patient today. With that being said I know he wants to avoid a below-knee amputation which is what has previously been recommended. 03-21-2022 upon evaluation today patient appears to be doing well in regard to the medial wound at this is shown signs of excellent improvement. In regard to the lateral wound this is a bit dry compared to what I want to see him Regino Schultze not draining as much but I think that we need to see what we can do about trying to keep this from drying out and keeping it a bit more moist. Patient voiced understanding. Overall I am very pleased otherwise with where we stand. 03-27-2022 upon evaluation today patient appears to be doing well currently in regard to his wound especially on the medial aspect this looks really good the lateral aspect is looking a little bit better to with some healthier appearance to the bone compared to last week to be honest. Fortunately I do not see any signs of active infection locally or systemically at this time which is great news. No fevers, chills, nausea, vomiting, or diarrhea. READMISSION 08/28/2022 This is a 57 year old man who is a diabetic on dialysis but not currently on any treatment for it. He has a known history of PAD. He was in this clinic for a protracted period of time this year for a left foot wound with underlying osteomyelitis for which she was treated with hyperbaric oxygen. As I understand things he left the clinic in June with wounds and an unhealed state possibly infected. 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 Unfortunately in September he required a revision of the BKA but he was ultimately admitted to [facility] for a protracted period with bacteremia  secondary to group A strep from 9/12-10/12. Marland Kitchen He is recently completed vancomycin he was taking at dialysis He has been left with a nonhealing surgical wound on his left BKA amputation site. He recently followed with vascular surgery who recommended Santyl and Vashe solution which they have been applying to the wound every day. It is noted that he does have severe PAD in the right lower extremity that is status post transmetatarsal amputation he is going to follow him up in 3 months. He is also seen infectious disease at [facility] who state that he had MRSA treated with cefazolin and vancomycin at hemodialysis. However he is listed as having an allergic reaction to cefazolin including lip swelling therefore he completed vancomycin. He is not felt to require any additional antibiotics. 09-04-2022 upon evaluation today patient appears to be doing well currently in regard to his wound on the medial aspect of this is amputation site where he had a BKA. On the lateral aspect however he has an area of the incision line that is open that actually goes somewhat deep. We did probe about 4 cm down although I do not feel any bone directly. Fortunately there does not appear to be any signs of active infection locally or systemically at this time. There is some concern here in my estimation about the possibility of infection and I think that he may need some extension of the antibiotics I Georgina Peer probably put him on linezolid as he was previously on vancomycin. 09-16-2022 upon evaluation today patient appears to be doing well currently in regard to his wound. There is good to be some need for sharp debridement today but fortunately nothing too significant. Fortunately I do not see any signs of active infection locally nor systemically at this time which is great news. No fevers, chills, nausea, vomiting, or diarrhea. 09-23-2022 upon evaluation today patient appears to be doing well currently in regard to his wounds that  were still in the cleaning up phase in regard to the primary wound where he dehisced. There is a lot of necrotic tissue that is can have to be cleaned out working on that at this point. 09-30-2022 upon evaluation today patient appears to be doing well currently in regard to his wounds. He is slowly making progress and getting the main open area on the medial aspect of his incision line in a much healthier state. In general I feel like that we are headed in the right direction. I do not see any signs of active infection at this time. 10-07-2022 upon evaluation today patient appears to be doing well currently in regard to his wound. He has been tolerating the dressing changes without complication of the lateral portion of the wound actually is closed the more medial portion is doing much better the swelling overall is excellent. He has been using the Tubigrip as a compression on the end of the stump and that has done extremely well for him. 12/26; unfortunately the patient's wound on the left BKA stump has a deeper area from 6-9 o'clock. There is not been any drainage she is not complaining of any pain. He states that he has been up in his wheelchair with his stump dependent quite a bit. This might account for why the edema in the stump  seems out of control. 10-23-2022 upon evaluation today patient appears to be doing well currently in regard to his wound. This is actually showing signs of improvement I do believe he would benefit from the possibility of a snap VAC. Again we will get a check into his insurance to see if we can get approval. If we are able to that would definitely benefit him I do believe. Subsequently I will get him to look into this when she gets back in the office come Monday. 10-30-2022 upon evaluation today patient appears to be doing well in regard to his wound he still has some depth here but fortunately there does not appear to be any signs of active infection locally nor  systemically which is great news. No fevers, chills, nausea, vomiting, or diarrhea. 1/25; patient with a wound on the left BKA stump. Looking at this superficially healthy granulation the small rim of epithelialization however from 6-9 o'clock still roughly 1.2 cm of depth. There is no palpable bone and no observable drainage. The patient and his wife. We have been using silver alginate. Electronic Signature(s) Signed: 11/13/2022 4:38:56 PM By: Linton Ham MD Entered By: Linton Ham on 11/13/2022 15:38:17 [name], [name] Faster ([id number]) [id number] Page 4 of 10 -------------------------------------------------------------------------------- Physical Exam Details Patient Name: Date of Service: [name] MES L. 11/13/2022 2:15 PM Medical Record Number: [id number] Patient Account Number: [id number] Date of Birth/Sex: Treating RN: Sep 17, 1966 (57 y.o. [name] Primary Care Provider: [name] Other Clinician: Referring Provider: Treating Provider/Extender: RO BSO N, MICHA EL [place] in Treatment: 11 Constitutional Sitting or standing Blood Pressure is within target range for patient.. Pulse regular and within target range for patient.Marland Kitchen Respirations regular, non-labored and within target range.. Temperature is normal and within the target range for the patient.Marland Kitchen appears in no distress. Notes Wound exam; patient has a small wound. Under illumination healthy looking granulation with even a rim of epithelialization at the superior part of this however from 6-9 o'clock inferiorly about 1.2 cm in depth. There is no palpable bone. No purulence no evidence of surrounding infection Electronic Signature(s) Signed: 11/13/2022 4:38:56 PM By: Linton Ham MD Entered By: Linton Ham on 11/13/2022 15:40:06 -------------------------------------------------------------------------------- Physician Orders Details Patient Name: Date of Service: [name], [name] MES L. 11/13/2022 2:15 PM Medical Record Number: [id number] Patient Account Number: [id number] Date of Birth/Sex: Treating RN: [birth year] (57 y.o. [name] Primary Care Provider: [name] Other Clinician: Referring Provider: Treating Provider/Extender: RO BSO N, MICHA EL [place] in Treatment: 11 Verbal / Phone Orders: No Diagnosis Coding Follow-up Appointments Return Appointment in 1 week. [place] for wound care. May utilize formulary equivalent dressing for wound treatment orders unless otherwise specified. Home Health Nurse may visit PRN to address patients wound care needs. Jackquline Denmark [phone] Bathing/ Shower/ Hygiene May shower; gently cleanse wound with antibacterial soap, rinse and pat dry prior to dressing wounds Edema Control - Lymphedema / Segmental Compressive Device / Other Tubigrip double layer applied - to stump and above knee Wound Treatment Wound #3 - Amputation Site - Below Knee Wound Laterality: Left Cleanser: Byram Ancillary Kit - 15 Day Supply (DME) (Generic) 1 x Per Day/30 Days QUASIR, CAPEHART ([id number]) [id number] Page 5 of 10 Discharge Instructions: Use supplies as instructed; Kit contains: (15) Saline Bullets; (15) 3x3 Gauze; 15 pr Gloves Cleanser: Wound Cleanser (DME) (Generic) 1 x Per Day/30 Days Discharge Instructions: Wash your hands with soap and water. Remove  old dressing, discard into plastic bag and place into trash. Cleanse the wound with Wound Cleanser prior to applying a clean dressing using gauze sponges, not tissues or cotton balls. Do not scrub or use excessive force. Pat dry using gauze sponges, not tissue or cotton balls. Prim Dressing: Silvercel 4 1/4x 4 1/4 (in/in) 1 x Per Day/30 Days ary Discharge Instructions: Apply Silvercel 4 1/4x 4 1/4 (in/in) as instructed Secondary Dressing: Gauze 1 x Per Day/30 Days Secured With: Medipore T - 38M Medipore H Soft  Cloth Surgical T ape ape, 2x2 (in/yd) (DME) (Generic) 1 x Per Day/30 Days Secured With: Tubigrip Size D, 3x10 (in/yd) 1 x Per Day/30 Days Discharge Instructions: double layer to thigh Add-Ons: Cotton Tipped Applicators, Sterile, 6(in) (DME) (Generic) 1 x Per Day/30 Days Electronic Signature(s) Signed: 11/13/2022 4:38:56 PM By: Linton Ham MD Signed: 11/13/2022 4:58:20 PM By: Rosalio Loud MSN RN CNS WTA Entered By: Rosalio Loud on 11/13/2022 15:48:52 -------------------------------------------------------------------------------- Problem List Details Patient Name: Date of Service: [name], [name] Brandy MES L. 11/13/2022 2:15 PM Medical Record Number: [id number] Patient Account Number: [id number] Date of Birth/Sex: Treating RN: October 24, 1965 (57 y.o. [name] Primary Care Provider: [name] Other Clinician: Referring Provider: Treating Provider/Extender: RO BSO N, MICHA EL [place] in Treatment: 11 Active Problems ICD-10 Encounter Code Description Active Date MDM Diagnosis L97.828 Non-pressure chronic ulcer of other part of left lower leg with other specified 08/28/2022 No Yes severity Z89.512 Acquired absence of left leg below knee 08/28/2022 No Yes E11.51 Type 2 diabetes mellitus with diabetic peripheral angiopathy without gangrene 08/28/2022 No Yes L03.116 Cellulitis of left lower limb 08/28/2022 No Yes Inactive Problems Resolved Problems Byron, Watt L ([id number]) [id number] Page 6 of 10 Electronic Signature(s) Signed: 11/13/2022 4:38:56 PM By: Linton Ham MD Entered By: Linton Ham on 11/13/2022 15:37:03 -------------------------------------------------------------------------------- Progress Note Details Patient Name: Date of Service: [name], [name] MES L. 11/13/2022 2:15 PM Medical Record Number: [id number] Patient Account Number: [id number] Date of Birth/Sex: Treating RN: Jan 01, 1966 (57 y.o. [name] Primary Care Provider:  [name] Other Clinician: Referring Provider: Treating Provider/Extender: RO BSO N, MICHA EL [place] in Treatment: 11 Subjective History of Present Illness (HPI) 10/31/2021 patient presents today for evaluation here in our clinic. He is actually being evaluated for hyperbaric oxygen therapy only. He has been seen at [facility] up to this point he is under the care of the vascular surgery specialty. Subsequently he is also under the care of the hyperbaric center there. With that being said that due to the fact that he is a dialysis patient he was actually recommended to be transferred to [place] for his treatments he actually lives in [place] which is where his dialysis is. It was a hardship for him to try to get from [place] especially on dialysis days all the way to [facility] get into the facility for his treatment and get back home he was literally spending all day long. He has had just 4 treatments there at [facility] thus far based on what I see in these have not been continued while he awaits approval here in our clinic. Subsequently I do have records for review that will be included in the HPI predetermination review and attached to this chart as well. I will not duplicate that here. Nonetheless of note the patient does still have osteomyelitis as evidenced by his most recent CT scan which was actually on 10/18/2021 this is post 1st and 2nd toe  ray amputation and revision. Nonetheless he continues to have exposed bone with marked soft tissue swelling compatible with osteomyelitis. This is due to the irregularity of the head of the metatarsal as well. Nonetheless along with having associated cellulitis he is also good to be seen by infectious disease and is currently on antibiotics for this as well. Again that will be detailed in the pretreatment review section which I will attach to this note as well. He does currently have a wound VAC in place and again the wound  was not evaluated here in the clinic as that is being completely managed by home health and [facility]. The patient does have a history of diabetes mellitus type 2, hypertension, coronary artery disease, and congestive heart failure. He also has cataracts of both eyes but no evidence of glaucoma on his most recent eye exam. 11/22/2021 we have been seeing this gentleman for hyperbaric oxygen therapy but have not actually evaluated his wound up to this point this was being managed by the wound care and vascular team I presumed at [place] at least that is what has been told to be previous. Nonetheless at this point I got a call from Atlee Abide who is a Librarian, academic at the [facility] vascular clinic with Dr. Durene Fruits and subsequently they were wanting to know if I could take over wound care for this patient. Apparently they do not have the ability for an office debridement which again I completely understand but nonetheless definitely is not an integral part of his healing along with the hyperbarics. Subsequently I discussed with the patient today that I do believe he would benefit going ahead and proceeding with evaluation here in the clinic for that reason I did go ahead and see him today to get things started. There is also been some confusion about the issues with his home health nurse and getting measurements to [facility] for the wound VAC in order to continue his wound VAC therapy. With that being said after I see him today we will make a determination on what to do with wound VAC we can definitely send measurements to [facility] but again the bigger question is whether this is the best way to go or not. 11/28/2021 upon evaluation today patient appears to be doing decently well in regard to his wound. He has been tolerating the dressing changes with the Dakin's moistened gauze dressing which I think is doing a good job. Fortunately I do not see any signs of active infection locally nor systemically at this  point. In general I think that he is actually making excellent progress which is great news. 2/17; seen in conjunction with HBO today. 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 Much improved condition of the wound surface. On the lateral foot and eschared area that he has been using Betadine. T olerating HBO well 12/13/2021 upon evaluation today patient appears to be doing pretty well in regard to his foot ulcer. I am actually much more pleased currently that I been with the way the appearance looks today. The wound bed does show some signs of slough and biofilm on the surface of the wound this is minimal I Minna perform debridement the I will do so very carefully due to the fact that to be honest the patient had a significant amount of bleeding in the past being on the Brilinta he had a difficult time clotting when I debrided him previously quite significantly. Fortunately I  do not see any signs of active infection at this time which is great news. No fevers, chills, nausea, vomiting, or diarrhea. 12/20/2021 upon evaluation today patient actually appears to be showing some signs of excellent improvement here. I am very pleased with where things stand currently. There does not appear to be any evidence of active infection locally nor systemically which is great news and I do believe that with the hyperbaric oxygen therapy he is actually making excellent progress. 3/9; patient presents for follow-up. He continues to do HBO therapy without issues. He has completed 34 out of 40 sessions. He continues to use Dakin's wet- to-dry to the amputation site and Betadine to the lateral foot wound. he currently denies signs of infection. 01/03/2022 upon evaluation today patient appears to be doing better in regard to his distal foot ulcer. Unfortunately the lateral foot is a different story this area has softened up and is getting need to be debrided away today. I Georgina Peer do this with  scissors and forceps and was able to clear that out that will be detailed below. Otherwise I feel like the Dakin's moistened gauze dressing is doing an awesome job for him and he is making significantly improvements here. 01/10/2022 upon evaluation today patient continues to have some of the necrotic tissue loosen up on the lateral portion of his foot the main wound that we have been treating is actually doing quite well. Overall very pleased with where things stand in general. No fevers, chills, nausea, vomiting, or diarrhea. 01/17/2022 upon evaluation today patient appears to be doing somewhat poorly in regard to the lateral foot wound. The main wound that we initially were seeing him for looks to be doing great. This lateral foot has worsened and unfortunately it appears to potentially have some signs of infection here. I do believe that we may need to see about getting started on some IV antibiotics he is in dialysis so they could potentially do this for him, Monday. Nonetheless I want to try YEN, MORRIS ([id number]) [id number] Page 7 of 10 to get that started ASAP. Again I would prefer him be initiated with IV vancomycin and cefepime just as an empiric broad-spectrum until we can get the culture results back at least. Subsequently also think that this should be done sooner rather than later due to the fact that his leg is more swollen and overall this seems to have progressed fairly rapidly since last week I do not want this to get worse. He voiced understanding. 01-24-2022 upon evaluation today patient appears to be doing well with regard to his main ulcer that we have been seeing him for. I am very pleased with where things stand currently. He still continues to have bleeding if we do any type of aggressive sharp debridement there for him having to be very careful using Dakin's solution along with debridement as I can here in the office. Overall the patient seems to be  doing well. Unfortunately the lateral foot wound still has progressed a little bit further. We did get him on antibiotics starting this past Monday I think the drainage looks much better and overall I am seeing improvement but again this was already broken down to a certain degree I am hopeful by next week this will really start turnaround for [place]. We will also get a send him for an x-ray to evaluate for any signs of osteomyelitis on this lateral foot. 01-31-2022 upon evaluation today patient's wound on the medial left amputation site is  actually doing excellent. I am very pleased with where things stand this is significantly smaller and we are headed in the right direction. In regard to the left lateral foot there is still some necrotic tissue to be cleared away although I see evidence of this starting to basically solidify as far as the edges are concerned with how far it spread I do not see anything seeming to want to spread further. I believe this may be close to done as far as any evolving is concerned. 02-07-2022 upon evaluation today patient appears to be doing well with regard to the main amputation site. Unfortunately the secondary site on the left lateral foot is still giving him some trouble here. There is a lot of dried tissue and I do believe that along with the Dakin's we may want to use a contact layer in the base of the wound to try to prevent the bone from drying out because we are down to that point at this time. The patient voiced understanding at this point as well and is happy to do what ever it takes to try to get this better. With that being said I do believe that it is possible he may benefit from a wound VAC. Again the question is the approval process for getting this done obviously we can see what we can do if we get things cleaned a little bit better but right now I think the best option is going to be for [place] to continue with the packing currently. 02-14-2022 upon evaluation  today patient presents for follow-up concerning ongoing issues with his foot. He did have the MRI which does show that he has evidence of osteomyelitis of the digit at the fifth location of his foot. Subsequently this does extend down to the base of the digit and into the proximal portion of the metatarsal as well. With that being said the bone does not appear to be too severely broken down and a lot of the marrow signal is stated to be maintained which is good news. Nonetheless I do believe that he is unfortunately still in a spot where this is precarious and we need to make sure that we are treating this appropriately. He does seem to be doing well with IV antibiotics he also seems to be doing quite well when it comes to the overall appearance of the wound bed at this point. I am happy in that regard. 02-21-2022 upon evaluation today patient appears to be doing well all things considered in regard to his wounds. I do feel like that he is making some progress here. The wound on the right distal foot where the amputation site is is actually healing nicely I feel like we can switch over to collagen at this point that should do well for him. In regard to the left lateral foot this is actually getting much cleaner he still has some area where there seems to be some pressure getting to the location I think that he would do well with a knee scooter at this point. I discussed that with him he does not seem prone to going that route at this point he understands it can definitely help. 02-28-2022 upon evaluation today patient appears to be doing well currently with regard to the wound on his foot. The main surgical wound is actually significantly improved and the lateral wound is doing better there is still bone exposed but nonetheless this does not appear to be significantly worse than what we have noted  previous. In fact I think it looks better than it did last week. My goal is to keep the bone from drying out  and subsequently for that reason we are going to go ahead and proceed with the EpiFix which I think is good to be a good option here for him. Over top of the EpiFix we will do a contact layer in order to hopefully keep things from drying out. This dressing will actually stay in place until he comes back. Patient did see vascular yesterday and states that they did not feel like anything was doing worse still it appears that if it were left up to them they think the ideal thing would be to proceed with an amputation. I explained to the patient that to be honest is not always avoidable but if we can try to avoid amputation that is what we are after. He voiced understanding and he does want to do what ever he can to try to prevent amputation for that reason we are proceeding with the epi cord today. This will be application #1. 0000000 upon evaluation today patient appears to be making some progress here regard to the lateral foot. Fortunately there does not appear to be any signs of active infection locally or systemically which is great news. No fevers, chills, nausea, vomiting, or diarrhea. I do think that he is appropriate for second application of EpiCord today. 03-14-2022 upon evaluation today patient's wound still has significant exposure of the bone at this point. He did get crutches he has been trying to use those the past couple of days am not sure that is going so well to be perfectly honest. We did discuss however offloading is important and we even discussed a total contact cast but to be perfectly honest right now and feels like he may need more debridement to clear away some of the bone which is not viable in the end of the foot of the fifth metatarsal but I am unable to really do that here in the office due to his bleeding. I do not know if potentially we could have him seen by podiatry potentially Dr. Merril Abbe to see if there is anything he can do to help in this regard. I am not even  certain whether the majority of the metatarsal is even viable to be honest. Either way I do think that he would benefit from surgical debridement or even possibly partial ray amputation in order for [place] to try to keep things moving in the right direction and get this closed. This was all discussed with the patient today. With that being said I know he wants to avoid a below-knee amputation which is what has previously been recommended. 03-21-2022 upon evaluation today patient appears to be doing well in regard to the medial wound at this is shown signs of excellent improvement. In regard to the lateral wound this is a bit dry compared to what I want to see him Regino Schultze not draining as much but I think that we need to see what we can do about trying to keep this from drying out and keeping it a bit more moist. Patient voiced understanding. Overall I am very pleased otherwise with where we stand. 03-27-2022 upon evaluation today patient appears to be doing well currently in regard to his wound especially on the medial aspect this looks really good the lateral aspect is looking a little bit better to with some healthier appearance to the bone compared to last week to  be honest. Fortunately I do not see any signs of active infection locally or systemically at this time which is great news. No fevers, chills, nausea, vomiting, or diarrhea. READMISSION 08/28/2022 This is a 57 year old man who is a diabetic on dialysis but not currently on any treatment for it. He has a known history of PAD. He was in this clinic for a protracted period of time this year for a left foot wound with underlying osteomyelitis for which she was treated with hyperbaric oxygen. As I understand things he left the clinic in June with wounds and an unhealed state possibly infected. 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 Unfortunately in September he required a revision of  the BKA but he was ultimately admitted to [facility] for a protracted period with bacteremia secondary to group A strep from 9/12-10/12. Marland Kitchen He is recently completed vancomycin he was taking at dialysis He has been left with a nonhealing surgical wound on his left BKA amputation site. He recently followed with vascular surgery who recommended Santyl and Vashe solution which they have been applying to the wound every day. It is noted that he does have severe PAD in the right lower extremity that is status post transmetatarsal amputation he is going to follow him up in 3 months. He is also seen infectious disease at [facility] who state that he had MRSA treated with cefazolin and vancomycin at hemodialysis. However he is listed as having an allergic reaction to cefazolin including lip swelling therefore he completed vancomycin. He is not felt to require any additional antibiotics. 09-04-2022 upon evaluation today patient appears to be doing well currently in regard to his wound on the medial aspect of this is amputation site where he had a BKA. On the lateral aspect however he has an area of the incision line that is open that actually goes somewhat deep. We did probe about 4 cm down although I do not feel any bone directly. Fortunately there does not appear to be any signs of active infection locally or systemically at this time. There is some concern here in my estimation about the possibility of infection and I think that he may need some extension of the antibiotics I Georgina Peer probably put him on linezolid as he was previously on vancomycin. 09-16-2022 upon evaluation today patient appears to be doing well currently in regard to his wound. There is good to be some need for sharp debridement today but fortunately nothing too significant. Fortunately I do not see any signs of active infection locally nor systemically at this time which is great news. No fevers, chills, nausea, vomiting, or diarrhea. 09-23-2022 upon  evaluation today patient appears to be doing well currently in regard to his wounds that were still in the cleaning up phase in regard to the [place], [place] L ([id number]) [id number] Page 8 of 10 primary wound where he dehisced. There is a lot of necrotic tissue that is can have to be cleaned out working on that at this point. 09-30-2022 upon evaluation today patient appears to be doing well currently in regard to his wounds. He is slowly making progress and getting the main open area on the medial aspect of his incision line in a much healthier state. In general I feel like that we are headed in the right direction. I do not see any signs of active infection at this time. 10-07-2022 upon evaluation today  patient appears to be doing well currently in regard to his wound. He has been tolerating the dressing changes without complication of the lateral portion of the wound actually is closed the more medial portion is doing much better the swelling overall is excellent. He has been using the Tubigrip as a compression on the end of the stump and that has done extremely well for him. 12/26; unfortunately the patient's wound on the left BKA stump has a deeper area from 6-9 o'clock. There is not been any drainage she is not complaining of any pain. He states that he has been up in his wheelchair with his stump dependent quite a bit. This might account for why the edema in the stump seems out of control. 10-23-2022 upon evaluation today patient appears to be doing well currently in regard to his wound. This is actually showing signs of improvement I do believe he would benefit from the possibility of a snap VAC. Again we will get a check into his insurance to see if we can get approval. If we are able to that would definitely benefit him I do believe. Subsequently I will get him to look into this when she gets back in the office come Monday. 10-30-2022 upon evaluation today patient  appears to be doing well in regard to his wound he still has some depth here but fortunately there does not appear to be any signs of active infection locally nor systemically which is great news. No fevers, chills, nausea, vomiting, or diarrhea. 1/25; patient with a wound on the left BKA stump. Looking at this superficially healthy granulation the small rim of epithelialization however from 6-9 o'clock still roughly 1.2 cm of depth. There is no palpable bone and no observable drainage. The patient and his wife. We have been using silver alginate. Objective Constitutional Sitting or standing Blood Pressure is within target range for patient.. Pulse regular and within target range for patient.Marland Kitchen Respirations regular, non-labored and within target range.. Temperature is normal and within the target range for the patient.Marland Kitchen appears in no distress. Vitals Time Taken: 2:46 PM, Height: 70 in, Weight: 166 lbs, BMI: 23.8, Temperature: 98.0 F, Pulse: 65 bpm, Respiratory Rate: 18 breaths/min, Blood Pressure: 132/64 mmHg. General Notes: Wound exam; patient has a small wound. Under illumination healthy looking granulation with even a rim of epithelialization at the superior part of this however from 6-9 o'clock inferiorly about 1.2 cm in depth. There is no palpable bone. No purulence no evidence of surrounding infection Integumentary (Hair, Skin) Wound #3 status is Open. Original cause of wound was Surgical Injury. The date acquired was: 07/19/2022. The wound has been in treatment 11 weeks. 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 The wound measures 0.2cm length x 0.7cm width x 0.4cm depth; 0.11cm^2 area and 0.044cm^3 volume. There is Fat Layer (Subcutaneous Tissue) exposed. There is no tunneling noted, however, there is undermining starting at 6:00 and ending at 9:00 with a maximum distance of 1.2cm. There is a medium amount of serosanguineous drainage noted. There is medium (34-66%) pink  granulation within the wound bed. There is a medium (34-66%) amount of necrotic tissue within the wound bed including Adherent Slough. 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 There was a Chemical/Enzymatic/Mechanical debridement performed by Ricard Dillon, MD. With the following instrument(s): Saline gauze. Other agent used was saline gauze. A Minimum amount of bleeding was controlled with Pressure. The procedure was tolerated well. Post Debridement Measurements: 0.2cm length x 0.7cm width x 0.6cm depth; 0.066cm^3 volume. Character of Wound/Ulcer Post Debridement is stable. Post procedure Diagnosis Wound #3: Same as Pre-Procedure Plan RIGDON, MACOMBER ([id number]) [id number] Page 9 of 10 Follow-up Appointments: Return Appointment in 1 week. Home Health: [facility] for wound care. May utilize formulary equivalent dressing for wound treatment orders unless otherwise specified. Home Health Nurse may visit PRN to address patientoos wound care needs. Jackquline Denmark [phone] Bathing/ Shower/ Hygiene: May shower; gently cleanse wound with antibacterial soap, rinse and pat dry prior to dressing wounds Edema Control - Lymphedema / Segmental Compressive Device / Other: Tubigrip double layer applied - to stump and above knee WOUND #3: - Amputation Site - Below Knee Wound Laterality: Left Cleanser: Byram Ancillary Kit - 15 Day Supply (DME) (Generic) 1 x Per Day/30 Days Discharge Instructions: Use supplies as instructed; Kit contains: (15) Saline Bullets; (15) 3x3 Gauze; 15 pr Gloves Cleanser: Wound  Cleanser (DME) (Generic) 1 x Per Day/30 Days Discharge Instructions: Wash your hands with soap and water. Remove old dressing, discard into plastic bag and place into trash. Cleanse the wound with Wound Cleanser prior to applying a clean dressing using gauze sponges, not tissues or cotton balls. Do not scrub or use excessive force. Pat dry using gauze sponges, not tissue or cotton balls. Prim Dressing: Silvercel 4 1/4x 4 1/4 (in/in) 1 x Per Day/30 Days ary Discharge Instructions: Apply Silvercel 4 1/4x 4 1/4 (in/in) as instructed Secondary Dressing: Gauze 1 x Per Day/30 Days Secured With: Medipore T - 4M Medipore H Soft Cloth Surgical T ape ape, 2x2 (in/yd) (DME) (Generic) 1 x Per Day/30 Days Secured With: Tubigrip Size D, 3x10 (in/yd) 1 x Per Day/30 Days Discharge Instructions: double layer to thigh Add-Ons: Cotton Tipped Applicators, Sterile, 6(in) (DME) (Generic) 1 x Per Day/30 Days 1. Probing depth from 6-9 o'clock is the real problem here. I continued the silver alginate for today but the thought about a snap VAC or perhaps Oasis if we can't get the snap VAC approved. The whole problem here is stimulating granulation to close this over especially in the deeper probing area. 2. No evidence of infection 3. As I understand the patient's insurance is Medicare but he has a commercial [employer].. I think a snap VAC probably would be my first choice here. If we can get this approved Electronic Signature(s) Signed: 12/15/2022 9:47:55 AM By: Gretta Cool, BSN, RN, CWS, Kim RN, BSN Signed: 01/07/2023 4:53:55 AM By: Linton Ham MD Previous Signature: 12/01/2022 11:23:47 AM Version By: Gretta Cool BSN, RN, CWS, Kim RN, BSN Previous Signature: 12/03/2022 6:35:17 AM Version By: Linton Ham MD Previous Signature: 11/13/2022 4:38:56 PM Version By: Linton Ham MD Entered By: Gretta Cool BSN, RN, CWS, Kim on 12/15/2022  09:47:55 -------------------------------------------------------------------------------- SuperBill Details Patient Name: Date of Service: [name], [name] Brandy MES L. 11/13/2022 Medical Record Number: [id number] Patient Account Number: [id number] Date of Birth/Sex: Treating RN: November 19, 1965 (57 y.o. [name] Primary Care Provider: [name] Other Clinician: Referring Provider: Treating Provider/Extender: RO BSO N, MICHA EL [place] in Treatment: 11 Diagnosis Coding ICD-10 Codes Code Description [phone] Non-pressure chronic ulcer of other part of left lower  leg with other specified severity Z89.512 Acquired absence of left leg below knee E11.51 Type 2 diabetes mellitus with diabetic peripheral angiopathy without gangrene L03.116 Cellulitis of left lower limb Facility Procedures : YEIDEN, FRENKEL Code: [id number] MES L ([id number]) Description: 11021 - DEB SUBQ TISSUE 20 SQ CM/< ICD-10 Diagnosis Description L97.828 Non-pressure chronic ulcer of other part of left lower leg with other specified [id number] Modifier: severity [id number] Quantity: 1 f Page 10 of 10 Physician Procedures : CPT4 Code Description Modifier [id number] 97282 - WC PHYS LEVEL 3 - EST PT ICD-10 Diagnosis Description L97.828 Non-pressure chronic ulcer of other part of left lower leg with other specified severity Z89.512 Acquired absence of left leg below knee Quantity: 1 : [id number] 11042 - WC PHYS SUBQ TISS 20 SQ CM ICD-10 Diagnosis Description L97.828 Non-pressure chronic ulcer of other part of left lower leg with other specified severity Quantity: 1 Electronic Signature(s) Signed: 11/13/2022 5:20:28 PM By: Gretta Cool, BSN, RN, CWS, Kim RN, BSN Signed: 11/20/2022 6:01:55 PM By: Linton Ham MD Previous Signature: 11/13/2022 3:49:46 PM Version By: Rosalio Loud MSN RN CNS WTA Previous Signature: 11/13/2022 4:38:56 PM Version By: Linton Ham MD Entered By: Gretta Cool, BSN, RN, CWS, Kim on 11/13/2022  17:20:27

## 2022-11-13 NOTE — Progress Notes (Signed)
[place], [name] ([id number]) [id number] Page 1 of 9 Visit Report for 11/13/2022 Arrival Information Details Patient Name: Date of Service: [name] [name]. 11/13/2022 2:15 PM Medical Record Number: [id number] Patient Account Number: [id number] Date of Birth/Sex: Treating RN: [birth year] (57 y.o. [name] Primary Care Desmond Szabo: [name] Other Clinician: Referring Kortland Nichols: Treating Clarann Helvey/Extender: RO BSO N, MICHA EL [place] in Treatment: 11 Visit Information History Since Last Visit Added or deleted any medications: No Patient Arrived: Wheel Chair Any new allergies or adverse reactions: No Arrival Time: 14:39 Had a fall or experienced change in No Accompanied By: wife activities of daily living that may affect Transfer Assistance: None risk of falls: Patient Identification Verified: Yes Hospitalized since last visit: No Secondary Verification Process Completed: Yes Has Dressing in Place as Prescribed: Yes Patient Requires Transmission-Based Precautions: No Pain Present Now: No Patient Has Alerts: No Electronic Signature(s) Signed: 11/13/2022 4:58:20 PM By: Rosalio Loud MSN RN CNS WTA Entered By: Rosalio Loud on 11/13/2022 14:42:39 -------------------------------------------------------------------------------- Clinic Level of Care Assessment Details Patient Name: Date of Service: [name] [name]. 11/13/2022 2:15 PM Medical Record Number: [id number] Patient Account Number: [id number] Date of Birth/Sex: Treating RN: [birth year] (57 y.o. [name] Primary Care [name]: [name] Other Clinician: Referring Phares Zaccone: Treating [name]/Extender: RO BSO N, MICHA EL [place] in Treatment: 11 Clinic Level of Care Assessment Items TOOL 4 Quantity Score X- 1 0 Use when only an EandM is performed on FOLLOW-UP visit ASSESSMENTS - Nursing Assessment / Reassessment X- 1 10 Reassessment of Co-morbidities  (includes updates in patient status) X- 1 5 Reassessment of Adherence to Treatment Plan ASSESSMENTS - Wound and Skin A ssessment / Reassessment X - Simple Wound Assessment / Reassessment - one wound 1 5 []  - 0 Complex Wound Assessment / Reassessment - multiple wounds [name] ([id number]) [id number] Page 2 of 9 []  - 0 Dermatologic / Skin Assessment (not related to wound area) ASSESSMENTS - Focused Assessment []  - 0 Circumferential Edema Measurements - multi extremities []  - 0 Nutritional Assessment / Counseling / Intervention []  - 0 Lower Extremity Assessment (monofilament, tuning fork, pulses) []  - 0 Peripheral Arterial Disease Assessment (using hand held doppler) ASSESSMENTS - Ostomy and/or Continence Assessment and Care []  - 0 Incontinence Assessment and Management []  - 0 Ostomy Care Assessment and Management (repouching, etc.) PROCESS - Coordination of Care X - Simple Patient / Family Education for ongoing care 1 15 []  - 0 Complex (extensive) Patient / Family Education for ongoing care X- 1 10 Staff obtains Programmer, systems, Records, T Results / Process Orders est []  - 0 Staff telephones HHA, Nursing Homes / Clarify orders / etc []  - 0 Routine Transfer to another Facility (non-emergent condition) []  - 0 Routine Hospital Admission (non-emergent condition) []  - 0 New Admissions / Biomedical engineer / Ordering NPWT Apligraf, etc. , []  - 0 Emergency Hospital Admission (emergent condition) X- 1 10 Simple Discharge Coordination []  - 0 Complex (extensive) Discharge Coordination PROCESS - Special Needs []  - 0 Pediatric / Minor Patient Management []  - 0 Isolation Patient Management []  - 0 Hearing / Language / Visual special needs []  - 0 Assessment of Community assistance (transportation, D/C planning, etc.) []  - 0 Additional assistance / Altered mentation []  - 0 Support Surface(s) Assessment (bed, cushion, seat, etc.) INTERVENTIONS -  Wound Cleansing / Measurement X - Simple Wound Cleansing - one wound 1 5 []  - 0 Complex Wound Cleansing - multiple wounds X-  1 5 Wound Imaging (photographs - any number of wounds) []  - 0 Wound Tracing (instead of photographs) X- 1 5 Simple Wound Measurement - one wound []  - 0 Complex Wound Measurement - multiple wounds INTERVENTIONS - Wound Dressings X - Small Wound Dressing one or multiple wounds 1 10 []  - 0 Medium Wound Dressing one or multiple wounds []  - 0 Large Wound Dressing one or multiple wounds []  - 0 Application of Medications - topical []  - 0 Application of Medications - injection INTERVENTIONS - Miscellaneous []  - 0 External ear exam []  - 0 Specimen Collection (cultures, biopsies, blood, body fluids, etc.) []  - 0 Specimen(s) / Culture(s) sent or taken to Lab for analysis MARTISE, WADDELL ([id number]) [id number] Page 3 of 9 []  - 0 Patient Transfer (multiple staff / Civil Service fast streamer / Similar devices) []  - 0 Simple Staple / Suture removal (25 or less) []  - 0 Complex Staple / Suture removal (26 or more) []  - 0 Hypo / Hyperglycemic Management (close monitor of Blood Glucose) []  - 0 Ankle / Brachial Index (ABI) - do not check if billed separately X- 1 5 Vital Signs Has the patient been seen at the hospital within the last three years: Yes Total Score: 85 Level Of Care: New/Established - Level 3 Electronic Signature(s) Signed: 11/13/2022 4:58:20 PM By: Rosalio Loud MSN RN CNS WTA Entered By: Rosalio Loud on 11/13/2022 15:49:01 -------------------------------------------------------------------------------- Encounter Discharge Information Details Patient Name: Date of Service: [name], [name] [name]. 11/13/2022 2:15 PM Medical Record Number: [id number] Patient Account Number: [id number] Date of Birth/Sex: Treating RN: 11-29-1965 (57 y.o. [name] Primary Care Tylik Treese: [name] Other Clinician: Referring Treasa Bradshaw: Treating  Fernanda Twaddell/Extender: RO BSO N, MICHA EL [place] in Treatment: 11 Encounter Discharge Information Items Post Procedure Vitals Discharge Condition: Stable Temperature (F): 98.0 Ambulatory Status: Wheelchair Pulse (bpm): 65 Discharge Destination: Home Respiratory Rate (breaths/min): 16 Transportation: Private Auto Blood Pressure (mmHg): 132/64 Accompanied By: wife Schedule Follow-up Appointment: Yes Clinical Summary of Care: Electronic Signature(s) Signed: 11/13/2022 3:55:20 PM By: Rosalio Loud MSN RN CNS WTA Entered By: Rosalio Loud on 11/13/2022 15:55:20 -------------------------------------------------------------------------------- Lower Extremity Assessment Details Patient Name: Date of Service: [name], [name] [name]. 11/13/2022 2:15 PM Medical Record Number: [id number] Patient Account Number: [id number] Date of Birth/Sex: Treating RN: 1966-04-30 (57 y.o. [name] Primary Care Yolando Gillum: [name] Other Clinician: Referring Braxen Dobek: Treating Plumer Mittelstaedt/Extender: [address] [place], [place] ([id number]) [phone].pdf Page 4 of 9 Weeks in Treatment: 11 Electronic Signature(s) Signed: 11/13/2022 3:44:03 PM By: Rosalio Loud MSN RN CNS WTA Entered By: Rosalio Loud on 11/13/2022 15:44:03 -------------------------------------------------------------------------------- Multi Wound Chart Details Patient Name: Date of Service: [name], [name] [name]. 11/13/2022 2:15 PM Medical Record Number: [id number] Patient Account Number: [id number] Date of Birth/Sex: Treating RN: 06-23-1966 (57 y.o. [name] Primary Care Branson Jon: [name] Other Clinician: Referring Sabriel Borromeo: Treating Pate Aylward/Extender: RO BSO N, MICHA EL [place] in Treatment: 11 Vital Signs Height(in): 70 Pulse(bpm): 65 Weight(lbs): 166 Blood Pressure(mmHg): 132/64 Body Mass Index(BMI): 23.8 Temperature(F): 98.0 Respiratory  Rate(breaths/min): 18 [3:Photos:] [N/A:N/A] Left Amputation Site - Below Knee N/A N/A Wound Location: Surgical Injury N/A N/A Wounding Event: Dehisced Wound N/A N/A Primary Etiology: Cataracts, Anemia, Sleep Apnea, N/A N/A Comorbid History: Arrhythmia, Congestive Heart Failure, Coronary Artery Disease, Hypertension, Myocardial Infarction, Type II Diabetes, Neuropathy, Seizure Disorder 07/19/2022 N/A N/A Date Acquired: 11 N/A N/A Weeks of Treatment: Open N/A N/A Wound Status: No  N/A N/A Wound Recurrence: 0.2x0.7x0.4 N/A N/A Measurements [name] x W x D (cm) 0.11 N/A N/A A (cm) : rea 0.044 N/A N/A Volume (cm) : 99.20% N/A N/A % Reduction in A rea: 98.90% N/A N/A % Reduction in Volume: 6 Starting Position 1 (o'clock): 9 Ending Position 1 (o'clock): 1.2 Maximum Distance 1 (cm): Yes N/A N/A Undermining: Full Thickness Without Exposed N/A N/A Classification: Support Structures Medium N/A N/A Exudate Amount: Serosanguineous N/A N/A Exudate Type: red, brown N/A N/A Exudate Color: Medium (34-66%) N/A N/A Granulation Amount: Pink N/A N/A Granulation Quality: Medium (34-66%) N/A N/A Necrotic Amount: Fat Layer (Subcutaneous Tissue): Yes N/A N/A Exposed Structures: [place], Matheu [name] ([id number]) [id number] Page 5 of 9 Fascia: No Tendon: No Muscle: No Joint: No Bone: No None N/A N/A Epithelialization: Treatment Notes Wound #3 (Amputation Site - Below Knee) Wound Laterality: Left Cleanser Peri-Wound Care Topical Primary Dressing Silvercel 4 1/4x 4 1/4 (in/in) Discharge Instruction: Apply Silvercel 4 1/4x 4 1/4 (in/in) as instructed Secondary Dressing Gauze Secured With Medipore T - 41M Medipore H Soft Cloth Surgical T ape ape, 2x2 (in/yd) Tubigrip Size D, 3x10 (in/yd) Discharge Instruction: double layer to thigh Compression Wrap Compression Stockings Add-Ons Electronic Signature(s) Signed: 11/13/2022 3:44:12 PM By: Rosalio Loud  MSN RN CNS WTA Entered By: Rosalio Loud on 11/13/2022 15:44:12 -------------------------------------------------------------------------------- Multi-Disciplinary Care Plan Details Patient Name: Date of Service: [name], [name] [name]. 11/13/2022 2:15 PM Medical Record Number: [id number] Patient Account Number: [id number] Date of Birth/Sex: Treating RN: 1966-10-04 (57 y.o. [name] Primary Care Grahm Etsitty: [name] Other Clinician: Referring Alayzia Pavlock: Treating Alee Katen/Extender: RO BSO N, MICHA EL [place] in Treatment: 11 Active Inactive Wound/Skin Impairment Nursing Diagnoses: Knowledge deficit related to ulceration/compromised skin integrity Goals: Patient/caregiver will verbalize understanding of skin care regimen Date Initiated: 08/28/2022 Target Resolution Date: 10/30/2022 Goal Status: Active Ulcer/skin breakdown will have a volume reduction of 30% by week 4 Date Initiated: 08/28/2022 Date Inactivated: 10/30/2022 Target Resolution Date: 10/17/2022 JARRETT, ALBOR ([id number]) [id number] Page 6 of 9 Goal Status: Met Ulcer/skin breakdown will have a volume reduction of 50% by week 8 Date Initiated: 08/28/2022 Target Resolution Date: 11/17/2022 Goal Status: Active Ulcer/skin breakdown will have a volume reduction of 80% by week 12 Date Initiated: 08/28/2022 Target Resolution Date: 12/18/2022 Goal Status: Active Ulcer/skin breakdown will heal within 14 weeks Date Initiated: 08/28/2022 Target Resolution Date: 01/16/2023 Goal Status: Active Interventions: Assess patient/caregiver ability to obtain necessary supplies Assess patient/caregiver ability to perform ulcer/skin care regimen upon admission and as needed Assess ulceration(s) every visit Notes: Electronic Signature(s) Signed: 11/13/2022 3:51:57 PM By: Rosalio Loud MSN RN CNS WTA Entered By: Rosalio Loud on 11/13/2022  15:51:57 -------------------------------------------------------------------------------- Pain Assessment Details Patient Name: Date of Service: [name], [name] [name]. 11/13/2022 2:15 PM Medical Record Number: [id number] Patient Account Number: [id number] Date of Birth/Sex: Treating RN: [birth year] (57 y.o. [name] Primary Care Raiya Stainback: [name] Other Clinician: Referring Yeng Frankie: Treating Vitalia Stough/Extender: RO BSO N, MICHA EL [place] in Treatment: 11 Active Problems Location of Pain Severity and Description of Pain Patient Has Paino No Site Locations Pain Management and Medication Current Pain Management: Electronic Signature(s) Signed: 11/13/2022 4:58:20 PM By: Rosalio Loud MSN RN CNS [address] [place] [name] ([id number]) [phone].pdf Page 7 of 9 Entered By: Rosalio Loud on 11/13/2022 14:47:04 -------------------------------------------------------------------------------- Patient/Caregiver Education Details Patient Name: Date of Service: [name] [name]. 1/25/2024andnbsp2:15 PM Medical Record Number: [id number] Patient Account Number: [id number] Date of Birth/Gender: Treating RN:  1965-11-25 (57 y.o. [name] Primary Care Physician: [name] Other Clinician: Referring Physician: Treating Physician/Extender: RO BSO N, [place] in Treatment: 11 Education Assessment Education Provided To: Patient Education Topics Provided Wound/Skin Impairment: Handouts: Caring for Your Ulcer Methods: Explain/Verbal Responses: State content correctly Electronic Signature(s) Signed: 11/13/2022 4:58:20 PM By: Rosalio Loud MSN RN CNS WTA Entered By: Rosalio Loud on 11/13/2022 15:51:52 -------------------------------------------------------------------------------- Wound Assessment Details Patient Name: Date of Service: [name], [name] [name]. 11/13/2022 2:15 PM Medical Record Number: [id number] Patient Account Number:  [id number] Date of Birth/Sex: Treating RN: 26-Dec-1965 (57 y.o. [name] Primary Care Noris Kulinski: [name] Other Clinician: Referring Latalia Etzler: Treating Angelo Prindle/Extender: RO BSO N, MICHA EL [place] in Treatment: 11 Wound Status Wound Number: 3 Primary Dehisced Wound Etiology: Wound Location: Left Amputation Site - Below Knee Wound Open Wounding Event: Surgical Injury Status: Date Acquired: 07/19/2022 Comorbid Cataracts, Anemia, Sleep Apnea, Arrhythmia, Congestive Heart Weeks Of Treatment: 11 History: Failure, Coronary Artery Disease, Hypertension, Myocardial Clustered Wound: No Infarction, Type II Diabetes, Neuropathy, Seizure Disorder Photos KELDRIC, POYER ([id number]) [id number] Page 8 of 9 Wound Measurements Length: (cm) 0.2 Width: (cm) 0.7 Depth: (cm) 0.4 Area: (cm) 0.11 Volume: (cm) 0.044 % Reduction in Area: 99.2% % Reduction in Volume: 98.9% Epithelialization: None Tunneling: No Undermining: Yes Starting Position (o'clock): 6 Ending Position (o'clock): 9 Maximum Distance: (cm) 1.2 Wound Description Classification: Full Thickness Without Exposed Support Structures Exudate Amount: Medium Exudate Type: Serosanguineous Exudate Color: red, brown Foul Odor After Cleansing: No Slough/Fibrino Yes Wound Bed Granulation Amount: Medium (34-66%) Exposed Structure Granulation Quality: Pink Fascia Exposed: No Necrotic Amount: Medium (34-66%) Fat Layer (Subcutaneous Tissue) Exposed: Yes Necrotic Quality: Adherent Slough Tendon Exposed: No Muscle Exposed: No Joint Exposed: No Bone Exposed: No Treatment Notes Wound #3 (Amputation Site - Below Knee) Wound Laterality: Left Cleanser Byram Ancillary Kit - 15 Day Supply Discharge Instruction: Use supplies as instructed; Kit contains: (15) Saline Bullets; (15) 3x3 Gauze; 15 pr Gloves Wound Cleanser Discharge Instruction: Wash your hands with soap and water. Remove old  dressing, discard into plastic bag and place into trash. Cleanse the wound with Wound Cleanser prior to applying a clean dressing using gauze sponges, not tissues or cotton balls. Do not scrub or use excessive force. Pat dry using gauze sponges, not tissue or cotton balls. Peri-Wound Care Topical Primary Dressing Silvercel 4 1/4x 4 1/4 (in/in) Discharge Instruction: Apply Silvercel 4 1/4x 4 1/4 (in/in) as instructed Secondary Dressing Gauze Secured With Medipore T - 10M Medipore H Soft Cloth Surgical T ape ape, 2x2 (in/yd) Tubigrip Size D, 3x10 (in/yd) Discharge Instruction: double layer to thigh Compression Wrap Compression Stockings Add-Ons Cotton Tipped Applicators, Sterile, 6(in) Electronic Signature(s) Signed: 11/13/2022 3:43:50 PM By: Rosalio Loud MSN RN CNS [address], [place] [name] ([id number]) PM By: Rosalio Loud MSN RN CNS Lissa Morales [phone].pdf Page 9 of 9 Signed: 11/13/2022 3:43:50 Entered By: Rosalio Loud on 11/13/2022 15:43:50 -------------------------------------------------------------------------------- Vitals Details Patient Name: Date of Service: [name] [name]. 11/13/2022 2:15 PM Medical Record Number: [id number] Patient Account Number: [id number] Date of Birth/Sex: Treating RN: 15-Apr-1966 (57 y.o. [name] Primary Care Glennis Borger: [name] Other Clinician: Referring Sabiha Sura: Treating Rolf Fells/Extender: RO BSO N, MICHA EL [place] in Treatment: 11 Vital Signs Time Taken: 14:46 Temperature (F): 98.0 Height (in): 70 Pulse (bpm): 65 Weight (lbs): 166 Respiratory Rate (breaths/min): 18 Body Mass Index (BMI): 23.8 Blood Pressure (mmHg): 132/64 Reference Range: 80 - 120  mg / dl Electronic Signature(s) Signed: 11/13/2022 4:58:20 PM By: Rosalio Loud MSN RN CNS WTA Entered By: Rosalio Loud on 11/13/2022 14:46:58

## 2022-11-17 ENCOUNTER — Ambulatory Visit: Payer: Self-pay | Admitting: Licensed Clinical Social Worker

## 2022-11-17 NOTE — Patient Outreach (Signed)
  Care Coordination   11/17/2022 Name: [name] MRN: [id number] DOB: 1966/09/15   Care Coordination Outreach Attempts:  An unsuccessful telephone outreach was attempted today to offer the patient information about available care coordination services as a benefit of their health plan.   Follow Up Plan:  Additional outreach attempts will be made to offer the patient care coordination information and services.   Encounter Outcome:  No answer    Care Coordination Interventions:  No, not indicated    Norva Riffle.Abdoulie Tierce MSW, [place] [facility] representative [facility] Care Management [phone]

## 2022-11-20 ENCOUNTER — Encounter: Payer: Medicare Other | Attending: Physician Assistant | Admitting: Physician Assistant

## 2022-11-20 DIAGNOSIS — I110 Hypertensive heart disease with heart failure: Secondary | ICD-10-CM | POA: Diagnosis not present

## 2022-11-20 DIAGNOSIS — I2510 Atherosclerotic heart disease of native coronary artery without angina pectoris: Secondary | ICD-10-CM | POA: Diagnosis not present

## 2022-11-20 DIAGNOSIS — E1136 Type 2 diabetes mellitus with diabetic cataract: Secondary | ICD-10-CM | POA: Insufficient documentation

## 2022-11-20 DIAGNOSIS — I509 Heart failure, unspecified: Secondary | ICD-10-CM | POA: Diagnosis not present

## 2022-11-20 DIAGNOSIS — Z09 Encounter for follow-up examination after completed treatment for conditions other than malignant neoplasm: Secondary | ICD-10-CM | POA: Insufficient documentation

## 2022-11-20 DIAGNOSIS — L97828 Non-pressure chronic ulcer of other part of left lower leg with other specified severity: Secondary | ICD-10-CM | POA: Insufficient documentation

## 2022-11-20 DIAGNOSIS — E1140 Type 2 diabetes mellitus with diabetic neuropathy, unspecified: Secondary | ICD-10-CM | POA: Insufficient documentation

## 2022-11-20 DIAGNOSIS — Z8614 Personal history of Methicillin resistant Staphylococcus aureus infection: Secondary | ICD-10-CM | POA: Insufficient documentation

## 2022-11-20 DIAGNOSIS — E11621 Type 2 diabetes mellitus with foot ulcer: Secondary | ICD-10-CM | POA: Diagnosis present

## 2022-11-20 DIAGNOSIS — Z89512 Acquired absence of left leg below knee: Secondary | ICD-10-CM | POA: Diagnosis not present

## 2022-11-20 DIAGNOSIS — E1151 Type 2 diabetes mellitus with diabetic peripheral angiopathy without gangrene: Secondary | ICD-10-CM | POA: Insufficient documentation

## 2022-11-20 DIAGNOSIS — Z992 Dependence on renal dialysis: Secondary | ICD-10-CM | POA: Diagnosis not present

## 2022-11-20 DIAGNOSIS — I252 Old myocardial infarction: Secondary | ICD-10-CM | POA: Insufficient documentation

## 2022-11-20 NOTE — Progress Notes (Signed)
[place], [place] [name] ([id number]) [id number] Page 1 of 9 Visit Report for 11/20/2022 Arrival Information Details Patient Name: Date of Service: [name] [name]. 11/20/2022 8:00 A M Medical Record Number: [id number] Patient Account Number: [id number] Date of Birth/Sex: Treating RN: 1966/03/30 (57 y.o. [name]) Dolores Lory, Morey Hummingbird Primary Care Ester Mabe: [name] Other Clinician: Referring Tamiya Colello: Treating Taliana Mersereau/Extender: [name]2 Visit Information History Since Last Visit Added or deleted any medications: No Patient Arrived: Wheel Chair Any new allergies or adverse reactions: No Arrival Time: 08:08 Had a fall or experienced change in No Accompanied By: wife activities of daily living that may affect Transfer Assistance: None risk of falls: Patient Identification Verified: Yes Signs or symptoms of abuse/neglect since last visito No Secondary Verification Process Completed: Yes Hospitalized since last visit: No Patient Requires Transmission-Based Precautions: No Implantable device outside of the clinic excluding No Patient Has Alerts: No cellular tissue based products placed in the center since last visit: Has Dressing in Place as Prescribed: Yes Pain Present Now: No Electronic Signature(s) Signed: 11/20/2022 4:39:28 PM By: [name] RN Entered By: [name] on 11/20/2022 08:08:47 -------------------------------------------------------------------------------- Clinic Level of Care Assessment Details Patient Name: Date of Service: [name] [name]. 11/20/2022 8:00 A M Medical Record Number: [id number] Patient Account Number: [id number] Date of Birth/Sex: Treating RN: 1965/11/15 (57 y.o. [name]) [name] Primary Care Mikya Don: [name] Other Clinician: Referring Nahmir Zeidman: Treating Tiernan Suto/Extender: [name]2 Clinic Level of Care Assessment Items TOOL 4 Quantity Score X- 1  0 Use when only an EandM is performed on FOLLOW-UP visit ASSESSMENTS - Nursing Assessment / Reassessment X- 1 10 Reassessment of Co-morbidities (includes updates in patient status) X- 1 5 Reassessment of Adherence to Treatment Plan [name], [name] ([id number]) [phone].pdf Page 2 of 9 ASSESSMENTS - Wound and Skin A ssessment / Reassessment X - Simple Wound Assessment / Reassessment - one wound 1 5 []  - 0 Complex Wound Assessment / Reassessment - multiple wounds []  - 0 Dermatologic / Skin Assessment (not related to wound area) ASSESSMENTS - Focused Assessment []  - 0 Circumferential Edema Measurements - multi extremities []  - 0 Nutritional Assessment / Counseling / Intervention []  - 0 Lower Extremity Assessment (monofilament, tuning fork, pulses) []  - 0 Peripheral Arterial Disease Assessment (using hand held doppler) ASSESSMENTS - Ostomy and/or Continence Assessment and Care []  - 0 Incontinence Assessment and Management []  - 0 Ostomy Care Assessment and Management (repouching, etc.) PROCESS - Coordination of Care X - Simple Patient / Family Education for ongoing care 1 15 []  - 0 Complex (extensive) Patient / Family Education for ongoing care []  - 0 Staff obtains Programmer, systems, Records, T Results / Process Orders est []  - 0 Staff telephones HHA, Nursing Homes / Clarify orders / etc []  - 0 Routine Transfer to another Facility (non-emergent condition) []  - 0 Routine Hospital Admission (non-emergent condition) []  - 0 New Admissions / Biomedical engineer / Ordering NPWT Apligraf, etc. , []  - 0 Emergency Hospital Admission (emergent condition) X- 1 10 Simple Discharge Coordination []  - 0 Complex (extensive) Discharge Coordination PROCESS - Special Needs []  - 0 Pediatric / Minor Patient Management []  - 0 Isolation Patient Management []  - 0 Hearing / Language / Visual special needs []  - 0 Assessment of Community assistance (transportation, D/C  planning, etc.) []  - 0 Additional assistance / Altered mentation []  - 0 Support Surface(s) Assessment (bed, cushion, seat, etc.) INTERVENTIONS - Wound Cleansing / Measurement X -  Simple Wound Cleansing - one wound 1 5 []  - 0 Complex Wound Cleansing - multiple wounds X- 1 5 Wound Imaging (photographs - any number of wounds) []  - 0 Wound Tracing (instead of photographs) X- 1 5 Simple Wound Measurement - one wound []  - 0 Complex Wound Measurement - multiple wounds INTERVENTIONS - Wound Dressings X - Small Wound Dressing one or multiple wounds 1 10 []  - 0 Medium Wound Dressing one or multiple wounds []  - 0 Large Wound Dressing one or multiple wounds X- 1 5 Application of Medications - topical []  - 0 Application of Medications - injection INTERVENTIONS - Miscellaneous []  - 0 External ear exam [name], [name] ([id number]) [id number] Page 3 of 9 []  - 0 Specimen Collection (cultures, biopsies, blood, body fluids, etc.) []  - 0 Specimen(s) / Culture(s) sent or taken to Lab for analysis []  - 0 Patient Transfer (multiple staff / [name] Lift / Similar devices) []  - 0 Simple Staple / Suture removal (25 or less) []  - 0 Complex Staple / Suture removal (26 or more) []  - 0 Hypo / Hyperglycemic Management (close monitor of Blood Glucose) []  - 0 Ankle / Brachial Index (ABI) - do not check if billed separately X- 1 5 Vital Signs Has the patient been seen at the hospital within the last three years: Yes Total Score: 80 Level Of Care: New/Established - Level 3 Electronic Signature(s) Signed: 11/20/2022 4:39:28 PM By: [name] RN Entered By: [name] on 11/20/2022 08:44:50 -------------------------------------------------------------------------------- Encounter Discharge Information Details Patient Name: Date of Service: [name], [name] Brandy [name]. 11/20/2022 8:00 A M Medical Record Number: [id number] Patient Account Number: [id number] Date of Birth/Sex: Treating  RN: 08-23-1966 (57 y.o. [name] Primary Care Tynisa Vohs: [name] Other Clinician: Referring Joanthony Hamza: Treating Hetvi Shawhan/Extender: [name]2 Encounter Discharge Information Items Discharge Condition: Stable Ambulatory Status: Wheelchair Discharge Destination: Home Transportation: Private Auto Accompanied By: wife Schedule Follow-up Appointment: Yes Clinical Summary of Care: Electronic Signature(s) Signed: 11/20/2022 4:39:28 PM By: [name] RN Entered By: [name] on 11/20/2022 08:47:44 -------------------------------------------------------------------------------- Lower Extremity Assessment Details Patient Name: Date of Service: [name], [name] Brandy [name]. 11/20/2022 8:00 A [place], [place] [name] ([id number]) [id number] Page 4 of 9 Medical Record Number: [id number] Patient Account Number: [id number] Date of Birth/Sex: Treating RN: [birth year] (57 y.o. [name]) [name] Primary Care Carnisha Feltz: [name] Other Clinician: Referring Teriah Muela: Treating Marleta Lapierre/Extender: [name]2 Electronic Signature(s) Signed: 11/20/2022 4:39:28 PM By: [name] RN Entered By: [name] on 11/20/2022 08:18:22 -------------------------------------------------------------------------------- Multi Wound Chart Details Patient Name: Date of Service: [name], [name] Brandy [name]. 11/20/2022 8:00 A M Medical Record Number: [id number] Patient Account Number: [id number] Date of Birth/Sex: Treating RN: 05-Jan-1966 (57 y.o. [name]) [name] Primary Care Bethenny Losee: [name] Other Clinician: Referring Myonna Chisom: Treating Joycelyn Liska/Extender: [name]2 Vital Signs Height(in): 70 Pulse(bpm): 56 Weight(lbs): 166 Blood Pressure(mmHg): 163/94 Body Mass Index(BMI): 23.8 Temperature(F): 98.2 Respiratory Rate(breaths/min): 16 [3:Photos:] [N/A:N/A] Left Amputation Site -  Below Knee N/A N/A Wound Location: Surgical Injury N/A N/A Wounding Event: Dehisced Wound N/A N/A Primary Etiology: Cataracts, Anemia, Sleep Apnea, N/A N/A Comorbid History: Arrhythmia, Congestive Heart Failure, Coronary Artery Disease, Hypertension, Myocardial Infarction, Type II Diabetes, Neuropathy, Seizure Disorder 07/19/2022 N/A N/A Date Acquired: 12 N/A N/A Weeks of Treatment: Open N/A N/A Wound Status: No N/A N/A Wound Recurrence: 0.2x0.6x0.4 N/A N/A Measurements [name] x W x D (cm) 0.094 N/A N/A  A (cm) : rea 0.038 N/A N/A Volume (cm) : 99.30% N/A N/A % Reduction in Area: 99.10% N/A N/A % Reduction in Volume: Full Thickness Without Exposed N/A N/A Classification: Support Structures Medium N/A N/A Exudate Amount: Serosanguineous N/A N/A Exudate Type: red, brown N/A N/A Exudate Color: Medium (34-66%) N/A N/A Granulation Amount: Pink N/A N/A Granulation Quality: Medium (34-66%) N/A N/A Necrotic Amount: [name], [name] ([id number]) [id number] Page 5 of 9 Fat Layer (Subcutaneous Tissue): Yes N/A N/A Exposed Structures: Fascia: No Tendon: No Muscle: No Joint: No Bone: No None N/A N/A Epithelialization: Treatment Notes Electronic Signature(s) Signed: 11/20/2022 4:39:28 PM By: [name] RN Entered By: [name] on 11/20/2022 08:18:27 -------------------------------------------------------------------------------- Multi-Disciplinary Care Plan Details Patient Name: Date of Service: [name], [name] Brandy [name]. 11/20/2022 8:00 A M Medical Record Number: [id number] Patient Account Number: [id number] Date of Birth/Sex: Treating RN: 02-28-1966 (57 y.o. [name] Primary Care Mairely Foxworth: [name] Other Clinician: Referring Anthonio Mizzell: Treating Sims Laday/Extender: [name]2 Active Inactive Wound/Skin Impairment Nursing Diagnoses: Knowledge deficit related to ulceration/compromised skin  integrity Goals: Patient/caregiver will verbalize understanding of skin care regimen Date Initiated: 08/28/2022 Target Resolution Date: 11/30/2022 Goal Status: Active Ulcer/skin breakdown will have a volume reduction of 30% by week 4 Date Initiated: 08/28/2022 Date Inactivated: 10/30/2022 Target Resolution Date: 10/17/2022 Goal Status: Met Ulcer/skin breakdown will have a volume reduction of 50% by week 8 Date Initiated: 08/28/2022 Date Inactivated: 11/20/2022 Target Resolution Date: 11/17/2022 Goal Status: Met Ulcer/skin breakdown will have a volume reduction of 80% by week 12 Date Initiated: 08/28/2022 Target Resolution Date: 12/18/2022 Goal Status: Active Ulcer/skin breakdown will heal within 14 weeks Date Initiated: 08/28/2022 Target Resolution Date: 01/16/2023 Goal Status: Active Interventions: Assess patient/caregiver ability to obtain necessary supplies Assess patient/caregiver ability to perform ulcer/skin care regimen upon admission and as needed Assess ulceration(s) every visit Notes: Electronic Signature(s) Signed: 11/20/2022 4:39:28 PM By: [name] RN Entered By: [name] on 11/20/2022 [name], [name] ([id number]) [id number] Page 6 of 9 -------------------------------------------------------------------------------- Pain Assessment Details Patient Name: Date of Service: [name] [name]. 11/20/2022 8:00 A M Medical Record Number: [id number] Patient Account Number: [id number] Date of Birth/Sex: Treating RN: April 10, 1966 (57 y.o. [name]) [name] Primary Care Mallary Kreger: [name] Other Clinician: Referring Rylon Poitra: Treating Gretta Samons/Extender: [name]2 Active Problems Location of Pain Severity and Description of Pain Patient Has Paino No Site Locations Pain Management and Medication Current Pain Management: Electronic Signature(s) Signed: 11/20/2022 4:39:28 PM By: [name] RN Entered By:  [name] on 11/20/2022 08:09:21 -------------------------------------------------------------------------------- Patient/Caregiver Education Details Patient Name: Date of Service: [name] [name]. 2/1/2024andnbsp8:00 A M Medical Record Number: [id number] Patient Account Number: [id number] Date of Birth/Gender: Treating RN: 04-24-1966 (57 y.o. [name] Primary Care Physician: [name] Other Clinician: Referring Physician: Treating Physician/Extender: [name] in Treatment: [address], [place] [name] ([id number]) [id number] Page 7 of 9 Education Assessment Education Provided To: Patient Education Topics Provided Wound/Skin Impairment: Methods: Explain/Verbal Responses: State content correctly Electronic Signature(s) Signed: 11/20/2022 4:39:28 PM By: [name] RN Entered By: [name] on 11/20/2022 08:18:48 -------------------------------------------------------------------------------- Wound Assessment Details Patient Name: Date of Service: [name], [name] Brandy [name]. 11/20/2022 8:00 A M Medical Record Number: [id number] Patient Account Number: [id number] Date of Birth/Sex: Treating RN: 08-20-1966 (57 y.o. [name] Primary Care Shalene Gallen: [name] Other Clinician: Referring Jacobey Gura: Treating Lillieann Pavlich/Extender: [name]2 Wound  Status Wound Number: 3 Primary Dehisced Wound Etiology: Wound Location: Left Amputation Site - Below Knee Wound Open Wounding Event: Surgical Injury Status: Date Acquired: 07/19/2022 Comorbid Cataracts, Anemia, Sleep Apnea, Arrhythmia, Congestive Heart Weeks Of Treatment: 12 History: Failure, Coronary Artery Disease, Hypertension, Myocardial Clustered Wound: No Infarction, Type II Diabetes, Neuropathy, Seizure Disorder Photos Wound Measurements Length: (cm) 0.2 Width: (cm) 0.6 Depth: (cm) 0.4 Area: (cm) 0.094 Volume: (cm) 0.038 % Reduction in  Area: 99.3% % Reduction in Volume: 99.1% Epithelialization: None Tunneling: No Undermining: No Wound Description Classification: Full Thickness Without Exposed Support Structures Exudate Amount: Medium Exudate Type: Serosanguineous [name], [name] ([id number]) Exudate Color: red, brown Foul Odor After Cleansing: No Slough/Fibrino Yes [id number] Page 8 of 9 Wound Bed Granulation Amount: Medium (34-66%) Exposed Structure Granulation Quality: Pink Fascia Exposed: No Necrotic Amount: Medium (34-66%) Fat Layer (Subcutaneous Tissue) Exposed: Yes Necrotic Quality: Adherent Slough Tendon Exposed: No Muscle Exposed: No Joint Exposed: No Bone Exposed: No Treatment Notes Wound #3 (Amputation Site - Below Knee) Wound Laterality: Left Cleanser [place] Ancillary Kit - 15 Day Supply Discharge Instruction: Use supplies as instructed; Kit contains: (15) Saline Bullets; (15) 3x3 Gauze; 15 pr Gloves Wound Cleanser Discharge Instruction: Wash your hands with soap and water. Remove old dressing, discard into plastic bag and place into trash. Cleanse the wound with Wound Cleanser prior to applying a clean dressing using gauze sponges, not tissues or cotton balls. Do not scrub or use excessive force. Pat dry using gauze sponges, not tissue or cotton balls. Peri-Wound Care Topical Primary Dressing Prisma 4.34 (in) Discharge Instruction: Moisten w/normal saline or sterile water; Cover wound as directed. Do not remove from wound bed. Secondary Dressing Gauze Secured With Medipore T - 64M Medipore H Soft Cloth Surgical T ape ape, 2x2 (in/yd) Tubigrip Size D, 3x10 (in/yd) Discharge Instruction: double layer to thigh Compression Wrap Compression Stockings Add-Ons Electronic Signature(s) Signed: 11/20/2022 4:39:28 PM By: [name] RN Entered By: [name] on 11/20/2022 08:18:04 -------------------------------------------------------------------------------- Vitals  Details Patient Name: Date of Service: [name], [place] [name]. 11/20/2022 8:00 A M Medical Record Number: [id number] Patient Account Number: [id number] Date of Birth/Sex: Treating RN: 08-07-1966 (57 y.o. [name]) [name] Primary Care Ursula Dermody: [name] Other Clinician: Referring Cutter Passey: Treating Al Gagen/Extender: [name]2 Vital Signs Time Taken: 08:08 Temperature (F): 98.2 Height (in): 70 Pulse (bpm): 71 Weight (lbs): 166 Respiratory Rate (breaths/min): 16 [name], [name] ([id number]) [id number] Page 9 of 9 Body Mass Index (BMI): 23.8 Blood Pressure (mmHg): 163/94 Reference Range: 80 - 120 mg / dl Electronic Signature(s) Signed: 11/20/2022 4:39:28 PM By: [name] RN Entered By: [name] on 11/20/2022 08:09:14

## 2022-11-20 NOTE — Progress Notes (Addendum)
[name], [name] ([id number]) [id number] Page 1 of 10 Visit Report for 11/20/2022 Chief Complaint Document Details Patient Name: Date of Service: [name] [name]. 11/20/2022 8:00 A M Medical Record Number: [id number] Patient Account Number: [id number] Date of Birth/Sex: Treating RN: 1966/10/14 (57 y.o. 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 11/20/2022 8:00 A M Medical Record Number: [id number] Patient Account Number: [id number] Date of Birth/Sex: Treating RN: 1965/10/28 (57 y.o. [name] Primary Care Provider: Nolene [name] Other Clinician: Referring Provider: Treating Provider/Extender: [name] in Treatment: 12 History of Present Illness HPI Description: 10/31/2021 patient presents today for evaluation here in our clinic. He is actually being evaluated for hyperbaric oxygen therapy only. He has been seen at [facility] up to this point he is under the care of the vascular surgery specialty. Subsequently he is also under the care of the hyperbaric center there. With that being said that due to the fact that he is a dialysis patient he was actually recommended to be transferred to [place] for his treatments he actually lives in [place] which is where his dialysis is. It was a hardship for him to try to get from [place] especially on dialysis days all the way to [facility] get into the facility for his treatment and  get back home he was literally spending all day long. He has had just 4 treatments there at [facility] thus far based on what I see in these have not been continued while he awaits approval here in our clinic. Subsequently I do have records for review that will be included in the HPI predetermination review and attached to this chart as well. I will not duplicate that here. 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 Nonetheless he continues to have exposed bone with marked soft tissue swelling compatible with osteomyelitis. This is due to the irregularity of the head of the metatarsal as well. Nonetheless along with having associated cellulitis he is also good to be seen by infectious disease and is currently on antibiotics for this as well. Again that will be detailed in the pretreatment review section which I will attach to this note as well. He does currently have a wound VAC in place and again the wound was not evaluated here in the clinic as that is being completely managed by home health and [facility]. The patient does have a history of diabetes mellitus type 2, hypertension, coronary artery disease, and congestive heart failure. He also has cataracts of both eyes but no evidence of glaucoma on his most recent eye exam. 11/22/2021 we have been seeing this gentleman for hyperbaric oxygen therapy but have not actually evaluated his wound up to this point this was being [place], [place] [name] ([id number]) [phone].pdf Page 2 of 10 managed by the wound care and vascular team I presumed at [facility] at least that is what has been told to be previous. Nonetheless at this point I got a  call from [name] who is a physician assistant at the [facility] vascular clinic with Dr. Durene [name] and subsequently they were wanting to know if I could take  over wound care for this patient. Apparently they do not have the ability for an office debridement which again I completely understand but nonetheless definitely is not an integral part of his healing along with the hyperbarics. Subsequently I discussed with the patient today that I do believe he would benefit going ahead and proceeding with evaluation here in the clinic for that reason I did go ahead and see him today to get things started. There is also been some confusion about the issues with his home health nurse and getting measurements to [facility] for the wound VAC in order to continue his wound VAC therapy. With that being said after I see him today we will make a determination on what to do with wound VAC we can definitely send measurements to [facility] but again the bigger question is whether this is the best way to go or not. 11/28/2021 upon evaluation today patient appears to be doing decently well in regard to his wound. He has been tolerating the dressing changes with the Dakin's moistened gauze dressing which I think is doing a good job. Fortunately I do not see any signs of active infection locally nor systemically at this point. In general I think that he is actually making excellent progress which is great news. 2/17; seen in conjunction with HBO today. 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 Much improved condition of the wound surface. On the lateral foot and eschared area that he has been using Betadine. T olerating HBO well 12/13/2021 upon evaluation today patient appears to be doing pretty well in regard to his foot ulcer. I am actually much more pleased currently that I been with the way the appearance looks today. The wound bed does show some signs of slough and biofilm on the surface of the wound this is minimal I Minna perform debridement the I will do so very carefully due to the fact that to be honest the patient had a significant amount of bleeding  in the past being on the Brilinta he had a difficult time clotting when I debrided him previously quite significantly. Fortunately I do not see any signs of active infection at this time which is great news. No fevers, chills, nausea, vomiting, or diarrhea. 12/20/2021 upon evaluation today patient actually appears to be showing some signs of excellent improvement here. I am very pleased with where things stand currently. There does not appear to be any evidence of active infection locally nor systemically which is great news and I do believe that with the hyperbaric oxygen therapy he is actually making excellent progress. 3/9; patient presents for follow-up. He continues to do HBO therapy without issues. He has completed 34 out of 40 sessions. He continues to use Dakin's wet- to-dry to the amputation site and Betadine to the lateral foot wound. he currently denies signs of infection. 01/03/2022 upon evaluation today patient appears to be doing better in regard to his distal foot ulcer. Unfortunately the lateral foot is a different story this area has softened up and is getting need to be debrided away today. I [name] Peer do this with scissors and forceps and was able to clear that out that will be detailed below. Otherwise I feel like the Dakin's moistened gauze dressing is doing an awesome job for him and  he is making significantly improvements here. 01/10/2022 upon evaluation today patient continues to have some of the necrotic tissue loosen up on the lateral portion of his foot the main wound that we have been treating is actually doing quite well. Overall very pleased with where things stand in general. No fevers, chills, nausea, vomiting, or diarrhea. 01/17/2022 upon evaluation today patient appears to be doing somewhat poorly in regard to the lateral foot wound. The main wound that we initially were seeing him for looks to be doing great. This lateral foot has worsened and unfortunately it appears to  potentially have some signs of infection here. I do believe that we may need to see about getting started on some IV antibiotics he is in dialysis so they could potentially do this for him, Monday. Nonetheless I want to try to get that started ASAP. Again I would prefer him be initiated with IV vancomycin and cefepime just as an empiric broad-spectrum until we can get the culture results back at least. Subsequently also think that this should be done sooner rather than later due to the fact that his leg is more swollen and overall this seems to have progressed fairly rapidly since last week I do not want this to get worse. He voiced understanding. 01-24-2022 upon evaluation today patient appears to be doing well with regard to his main ulcer that we have been seeing him for. I am very pleased with where things stand currently. He still continues to have bleeding if we do any type of aggressive sharp debridement there for him having to be very careful using Dakin's solution along with debridement as I can here in the office. Overall the patient seems to be doing well. Unfortunately the lateral foot wound still has progressed a little bit further. We did get him on antibiotics starting this past Monday I think the drainage looks much better and overall I am seeing improvement but again this was already broken down to a certain degree I am hopeful by next week this will really start turnaround for [place]. We will also get a send him for an x-ray to evaluate for any signs of osteomyelitis on this lateral foot. 01-31-2022 upon evaluation today patient's wound on the medial left amputation site is actually doing excellent. I am very pleased with where things stand this is significantly smaller and we are headed in the right direction. In regard to the left lateral foot there is still some necrotic tissue to be cleared away although I see evidence of this starting to basically solidify as far as the edges are  concerned with how far it spread I do not see anything seeming to want to spread further. I believe this may be close to done as far as any evolving is concerned. 02-07-2022 upon evaluation today patient appears to be doing well with regard to the main amputation site. Unfortunately the secondary site on the left lateral foot is still giving him some trouble here. There is a lot of dried tissue and I do believe that along with the Dakin's we may want to use a contact layer in the base of the wound to try to prevent the bone from drying out because we are down to that point at this time. The patient voiced understanding at this point as well and is happy to do what ever it takes to try to get this better. With that being said I do believe that it is possible he may benefit from a wound  VAC. Again the question is the approval process for getting this done obviously we can see what we can do if we get things cleaned a little bit better but right now I think the best option is going to be for [place] to continue with the packing currently. 02-14-2022 upon evaluation today patient presents for follow-up concerning ongoing issues with his foot. He did have the MRI which does show that he has evidence of osteomyelitis of the digit at the fifth location of his foot. Subsequently this does extend down to the base of the digit and into the proximal portion of the metatarsal as well. With that being said the bone does not appear to be too severely broken down and a lot of the marrow signal is stated to be maintained which is good news. Nonetheless I do believe that he is unfortunately still in a spot where this is precarious and we need to make sure that we are treating this appropriately. He does seem to be doing well with IV antibiotics he also seems to be doing quite well when it comes to the overall appearance of the wound bed at this point. I am happy in that regard. 02-21-2022 upon evaluation today patient appears  to be doing well all things considered in regard to his wounds. I do feel like that he is making some progress here. The wound on the right distal foot where the amputation site is is actually healing nicely I feel like we can switch over to collagen at this point that should do well for him. In regard to the left lateral foot this is actually getting much cleaner he still has some area where there seems to be some pressure getting to the location I think that he would do well with a knee scooter at this point. I discussed that with him he does not seem prone to going that route at this point he understands it can definitely help. 02-28-2022 upon evaluation today patient appears to be doing well currently with regard to the wound on his foot. The main surgical wound is actually significantly improved and the lateral wound is doing better there is still bone exposed but nonetheless this does not appear to be significantly worse than what we have noted previous. In fact I think it looks better than it did last week. My goal is to keep the bone from drying out and subsequently for that reason we are going to go ahead and proceed with the EpiFix which I think is good to be a good option here for him. Over top of the EpiFix we will do a contact layer in order to hopefully keep things from drying out. This dressing will actually stay in place until he comes back. Patient did see vascular yesterday and states that they did not feel like anything was doing worse still it appears that if it were left up to them they think the ideal thing would be to proceed with an amputation. I explained to the patient that to be honest is not always avoidable but if we can try to avoid amputation that is what we are after. He voiced understanding and he does want to do what ever he can to try to prevent amputation for that reason we are proceeding with the epi cord today. This will be application #1. [id number] upon evaluation  today patient appears to be making some progress here regard to the lateral foot. Fortunately there does not appear to be any signs  of active infection locally or systemically which is great news. No fevers, chills, nausea, vomiting, or diarrhea. I do think that he is appropriate for second application of EpiCord today. 03-14-2022 upon evaluation today patient's wound still has significant exposure of the bone at this point. He did get crutches he has been trying to use those the past couple of days am not sure that is going so well to be perfectly honest. We did discuss however offloading is important and we even discussed a total contact cast but to be perfectly honest right now and feels like he may need more debridement to clear away some of the bone which is not viable in the end of the foot of the fifth metatarsal but I am unable to really do that here in the office due to his bleeding. I do not know if potentially we could have him seen by podiatry potentially Dr. Merril Abbe to see if there is anything he can do to help in this regard. I am not even certain whether the majority of the [place], [place] [name] ([id number]) [id number] Page 3 of 10 metatarsal is even viable to be honest. Either way I do think that he would benefit from surgical debridement or even possibly partial ray amputation in order for [place] to try to keep things moving in the right direction and get this closed. This was all discussed with the patient today. With that being said I know he wants to avoid a below-knee amputation which is what has previously been recommended. 03-21-2022 upon evaluation today patient appears to be doing well in regard to the medial wound at this is shown signs of excellent improvement. In regard to the lateral wound this is a bit dry compared to what I want to see him [name] not draining as much but I think that we need to see what we can do about trying to keep this from drying out  and keeping it a bit more moist. Patient voiced understanding. Overall I am very pleased otherwise with where we stand. 03-27-2022 upon evaluation today patient appears to be doing well currently in regard to his wound especially on the medial aspect this looks really good the lateral aspect is looking a little bit better to with some healthier appearance to the bone compared to last week to be honest. Fortunately I do not see any signs of active infection locally or systemically at this time which is great news. No fevers, chills, nausea, vomiting, or diarrhea. READMISSION 08/28/2022 This is a 57 year old man who is a diabetic on dialysis but not currently on any treatment for it. He has a known history of PAD. He was in this clinic for a protracted period of time this year for a left foot wound with underlying osteomyelitis for which she was treated with hyperbaric oxygen. As I understand things he left the clinic in June with wounds and an unhealed state possibly infected. 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 Unfortunately in September he required a revision of the BKA but he was ultimately admitted to [facility] for a protracted period with bacteremia secondary to group A strep from 9/12-10/12. Marland Kitchen He is recently completed vancomycin he was taking at dialysis He has been left with a nonhealing surgical wound on his left BKA amputation site. He recently followed with vascular surgery who recommended Santyl and Vashe solution which they have been applying  to the wound every day. It is noted that he does have severe PAD in the right lower extremity that is status post transmetatarsal amputation he is going to follow him up in 3 months. He is also seen infectious disease at [facility] who state that he had MRSA treated with cefazolin and vancomycin at hemodialysis. However he is listed as having an allergic reaction to cefazolin including lip  swelling therefore he completed vancomycin. He is not felt to require any additional antibiotics. 09-04-2022 upon evaluation today patient appears to be doing well currently in regard to his wound on the medial aspect of this is amputation site where he had a BKA. On the lateral aspect however he has an area of the incision line that is open that actually goes somewhat deep. We did probe about 4 cm down although I do not feel any bone directly. Fortunately there does not appear to be any signs of active infection locally or systemically at this time. There is some concern here in my estimation about the possibility of infection and I think that he may need some extension of the antibiotics I [name] probably put him on linezolid as he was previously on vancomycin. 09-16-2022 upon evaluation today patient appears to be doing well currently in regard to his wound. There is good to be some need for sharp debridement today but fortunately nothing too significant. Fortunately I do not see any signs of active infection locally nor systemically at this time which is great news. No fevers, chills, nausea, vomiting, or diarrhea. 09-23-2022 upon evaluation today patient appears to be doing well currently in regard to his wounds that were still in the cleaning up phase in regard to the primary wound where he dehisced. There is a lot of necrotic tissue that is can have to be cleaned out working on that at this point. 09-30-2022 upon evaluation today patient appears to be doing well currently in regard to his wounds. He is slowly making progress and getting the main open area on the medial aspect of his incision line in a much healthier state. In general I feel like that we are headed in the right direction. I do not see any signs of active infection at this time. 10-07-2022 upon evaluation today patient appears to be doing well currently in regard to his wound. He has been tolerating the dressing changes  without complication of the lateral portion of the wound actually is closed the more medial portion is doing much better the swelling overall is excellent. He has been using the Tubigrip as a compression on the end of the stump and that has done extremely well for him. 12/26; unfortunately the patient's wound on the left BKA stump has a deeper area from 6-9 o'clock. There is not been any drainage she is not complaining of any pain. He states that he has been up in his wheelchair with his stump dependent quite a bit. This might account for why the edema in the stump seems out of control. 10-23-2022 upon evaluation today patient appears to be doing well currently in regard to his wound. This is actually showing signs of improvement I do believe he would benefit from the possibility of a snap VAC. Again we will get a check into his insurance to see if we can get approval. If we are able to that would definitely benefit him I do believe. Subsequently I will get him to look into this when she gets back in the office come Monday.  10-30-2022 upon evaluation today patient appears to be doing well in regard to his wound he still has some depth here but fortunately there does not appear to be any signs of active infection locally nor systemically which is great news. No fevers, chills, nausea, vomiting, or diarrhea. 1/25; patient with a wound on the left BKA stump. Looking at this superficially healthy granulation the small rim of epithelialization however from 6-9 o'clock still roughly 1.2 cm of depth. There is no palpable bone and no observable drainage. The patient and his wife. We have been using silver alginate. 11-20-2022 upon evaluation today patient appears to be doing well with regard to his wound. He is showing signs of improvement although I think at this point the alginate probably is not necessary I think switching to a collagen based dressing would be perfect for him. The patient voiced understanding  and is in agreement with the plan. Electronic Signature(s) Signed: 11/20/2022 9:37:14 AM By: Worthy Keeler PA-C Entered By: Worthy Keeler on 11/20/2022 09:37:14 -------------------------------------------------------------------------------- Physical Exam Details Patient Name: Date of Service: [name], JA [name]. 11/20/2022 8:00 [address] [place], [place] [name] ([id number]) [phone].pdf Page 4 of 10 Medical Record Number: [id number] Patient Account Number: [id number] Date of Birth/Sex: Treating RN: 05/15/1966 (57 y.o. [name] Primary Care Provider: Nolene [name] Other Clinician: Referring Provider: Treating Provider/Extender: [name] in Treatment: 102 Constitutional Well-nourished and well-hydrated in no acute distress. Respiratory normal breathing without difficulty. Psychiatric this patient is able to make decisions and demonstrates good insight into disease process. Alert and Oriented x 3. pleasant and cooperative. Notes Upon inspection patient's wound bed actually showed signs of good granulation epithelization at this point. Fortunately I see no evidence of active infection locally nor systemically which is great news and I think that he is really doing quite well. The biggest thing I see is I do believe we should go ahead and get him started with a collagen based dressing which I think should do quite well for him. He is in agreement with that plan. Electronic Signature(s) Signed: 11/20/2022 9:37:45 AM By: Worthy Keeler PA-C Entered By: Worthy Keeler on 11/20/2022 09:37:45 -------------------------------------------------------------------------------- Physician Orders Details Patient Name: Date of Service: [name], [name] [name]. 11/20/2022 8:00 A M Medical Record Number: [id number] Patient Account Number: [id number] Date of Birth/Sex: Treating RN: Apr 14, 1966 (57 y.o. [name]) [name] Primary Care Provider: Nolene [name] Other  Clinician: Referring Provider: Treating Provider/Extender: [name] in Treatment: 12 Verbal / Phone Orders: No Diagnosis Coding ICD-10 Coding Code Description [phone] Non-pressure chronic ulcer of other part of left lower leg with other specified severity Z89.512 Acquired absence of left leg below knee E11.51 Type 2 diabetes mellitus with diabetic peripheral angiopathy without gangrene L03.116 Cellulitis of left lower limb Follow-up Appointments Return Appointment in 1 week. Bathing/ Shower/ Hygiene May shower; gently cleanse wound with antibacterial soap, rinse and pat dry prior to dressing wounds Edema Control - Lymphedema / Segmental Compressive Device / Other Tubigrip double layer applied - to stump and above knee Wound Treatment Wound #3 - Amputation Site - Below Knee Wound Laterality: Left Cleanser: Byram Ancillary Kit - 15 Day Supply (DME) (Generic) 1 x Per Day/30 Days Discharge Instructions: Use supplies as instructed; Kit contains: (15) Saline Bullets; (15) 3x3 Gauze; 15 pr Gloves Cleanser: Wound Cleanser (Generic) 1 x Per Day/30 Days Discharge Instructions: Wash your hands with soap and water. Remove old dressing, discard into plastic bag and  place into trash. Cleanse the wound with Wound Cleanser prior to applying a clean dressing using gauze sponges, not tissues or cotton balls. Do not scrub or use excessive [place], CHENEY EWART ([id number]) [id number].pdf Page 5 of 10 force. Pat dry using gauze sponges, not tissue or cotton balls. Prim Dressing: Prisma 4.34 (in) 1 x Per Day/30 Days ary Discharge Instructions: Moisten w/normal saline or sterile water; Cover wound as directed. Do not remove from wound bed. Secondary Dressing: Gauze (DME) (Generic) 1 x Per Day/30 Days Secured With: Medipore T - 37M Medipore H Soft Cloth Surgical T ape ape, 2x2 (in/yd) (DME) (Generic) 1 x Per Day/30 Days Secured With: Tubigrip Size D, 3x10 (in/yd) 1  x Per Day/30 Days Discharge Instructions: double layer to thigh Electronic Signature(s) Signed: 11/20/2022 4:39:28 PM By: [name] Coria RN Signed: 11/20/2022 4:41:53 PM By: Worthy Keeler PA-C Entered By: [name] on 11/20/2022 08:45:17 -------------------------------------------------------------------------------- Problem List Details Patient Name: Date of Service: [name], [name] [name]. 11/20/2022 8:00 A M Medical Record Number: [id number] Patient Account Number: [id number] Date of Birth/Sex: Treating RN: 09/17/1966 (57 y.o. [name] Primary Care Provider: Nolene [name] Other Clinician: Referring Provider: Treating Provider/Extender: [name] in Treatment: 12 Active Problems ICD-10 Encounter Code Description Active Date MDM Diagnosis [id number] Non-pressure chronic ulcer of other part of left lower leg with other specified 08/28/2022 No Yes severity Z89.512 Acquired absence of left leg below knee 08/28/2022 No Yes E11.51 Type 2 diabetes mellitus with diabetic peripheral angiopathy without gangrene 08/28/2022 No Yes L03.116 Cellulitis of left lower limb 08/28/2022 No Yes Inactive Problems Resolved Problems Electronic Signature(s) Signed: 11/20/2022 8:14:43 AM By: Worthy Keeler PA-C Entered By: Worthy Keeler on 11/20/2022 [address], Joyice Faster ([id number]) [id number] Page 6 of 10 -------------------------------------------------------------------------------- Progress Note Details Patient Name: Date of Service: [name] [name]. 11/20/2022 8:00 A M Medical Record Number: [id number] Patient Account Number: [id number] Date of Birth/Sex: Treating RN: 05-23-1966 (57 y.o. 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 He is actually being evaluated for hyperbaric oxygen therapy only. He has been seen at [facility] up to this point he is under the care of the vascular surgery specialty. Subsequently he is also under the care of the hyperbaric center there. With that being said that due to the fact that he is a dialysis patient he was actually recommended to be transferred to [place] for his treatments he actually lives in [place] which is where his dialysis is. It was a hardship for him to try to get from [place] especially on dialysis days all the way to [facility] get into the facility for his treatment and get back home he was literally spending all day long. He has had just 4 treatments there at [facility] thus far based on what I see in these have not been continued while he awaits approval here in our clinic. Subsequently I do have records for review that will be included in the HPI predetermination review and attached to this chart as well. I will not duplicate that here. Nonetheless of note the patient does still have osteomyelitis as evidenced by his most recent CT scan which was actually on  10/18/2021 this is post 1st and 2nd toe ray amputation and revision. Nonetheless he continues to have exposed bone with marked soft tissue swelling compatible with osteomyelitis. This is due to the irregularity of the head of the metatarsal as well. Nonetheless along with having associated cellulitis he is also good to be seen by infectious disease and is currently on antibiotics for this as well. Again that will be detailed in the pretreatment review section which I will attach to this note as well. He does currently have a wound VAC in place and again the wound was not evaluated here in the clinic as that is being completely managed by home health and [facility]. The  patient does have a history of diabetes mellitus type 2, hypertension, coronary artery disease, and congestive heart failure. He also has cataracts of both eyes but no evidence of glaucoma on his most recent eye exam. 11/22/2021 we have been seeing this gentleman for hyperbaric oxygen therapy but have not actually evaluated his wound up to this point this was being managed by the wound care and vascular team I presumed at [place] at least that is what has been told to be previous. Nonetheless at this point I got a call from [name] who is a Librarian, academic at the [facility] vascular clinic with Dr. Durene [name] and subsequently they were wanting to know if I could take over wound care for this patient. Apparently they do not have the ability for an office debridement which again I completely understand but nonetheless definitely is not an integral part of his healing along with the hyperbarics. Subsequently I discussed with the patient today that I do believe he would benefit going ahead and proceeding with evaluation here in the clinic for that reason I did go ahead and see him today to get things started. There is also been some confusion about the issues with his home health nurse and getting measurements to [facility] for the wound VAC in order to continue his wound VAC therapy. With that being said after I see him today we will make a determination on what to do with wound VAC we can definitely send measurements to [facility] but again the bigger question is whether this is the best way to go or not. 11/28/2021 upon evaluation today patient appears to be doing decently well in regard to his wound. He has been tolerating the dressing changes with the Dakin's moistened gauze dressing which I think is doing a good job. Fortunately I do not see any signs of active infection locally nor systemically at this point. In general I think that he is actually making excellent progress which is great news. 2/17; seen in conjunction  with HBO today. 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 Much improved condition of the wound surface. On the lateral foot and eschared area that he has been using Betadine. T olerating HBO well 12/13/2021 upon evaluation today patient appears to be doing pretty well in regard to his foot ulcer. I am actually much more pleased currently that I been with the way the appearance looks today. The wound bed does show some signs of slough and biofilm on the surface of the wound this is minimal I Minna perform debridement the I will do so very carefully due to the fact that to be honest the patient had a significant amount of bleeding in the past being on the Brilinta he had a difficult time clotting when  I debrided him previously quite significantly. Fortunately I do not see any signs of active infection at this time which is great news. No fevers, chills, nausea, vomiting, or diarrhea. 12/20/2021 upon evaluation today patient actually appears to be showing some signs of excellent improvement here. I am very pleased with where things stand currently. There does not appear to be any evidence of active infection locally nor systemically which is great news and I do believe that with the hyperbaric oxygen therapy he is actually making excellent progress. 3/9; patient presents for follow-up. He continues to do HBO therapy without issues. He has completed 34 out of 40 sessions. He continues to use Dakin's wet- to-dry to the amputation site and Betadine to the lateral foot wound. he currently denies signs of infection. 01/03/2022 upon evaluation today patient appears to be doing better in regard to his distal foot ulcer. Unfortunately the lateral foot is a different story this area has softened up and is getting need to be debrided away today. I [name] Peer do this with scissors and forceps and was able to clear that out that will be detailed below. Otherwise I feel like the Dakin's  moistened gauze dressing is doing an awesome job for him and he is making significantly improvements here. 01/10/2022 upon evaluation today patient continues to have some of the necrotic tissue loosen up on the lateral portion of his foot the main wound that we have been treating is actually doing quite well. Overall very pleased with where things stand in general. No fevers, chills, nausea, vomiting, or diarrhea. 01/17/2022 upon evaluation today patient appears to be doing somewhat poorly in regard to the lateral foot wound. The main wound that we initially were seeing him for looks to be doing great. This lateral foot has worsened and unfortunately it appears to potentially have some signs of infection here. I do believe that we may need to see about getting started on some IV antibiotics he is in dialysis so they could potentially do this for him, Monday. Nonetheless I want to try to get that started ASAP. Again I would prefer him be initiated with IV vancomycin and cefepime just as an empiric broad-spectrum until we can get the culture results back at least. Subsequently also think that this should be done sooner rather than later due to the fact that his leg is more swollen and overall this seems to have progressed fairly rapidly since last week I do not want this to get worse. He voiced understanding. [place], [place] [name] ([id number]) [id number] Page 7 of 10 01-24-2022 upon evaluation today patient appears to be doing well with regard to his main ulcer that we have been seeing him for. I am very pleased with where things stand currently. He still continues to have bleeding if we do any type of aggressive sharp debridement there for him having to be very careful using Dakin's solution along with debridement as I can here in the office. Overall the patient seems to be doing well. Unfortunately the lateral foot wound still has progressed a little bit further. We did get him on  antibiotics starting this past Monday I think the drainage looks much better and overall I am seeing improvement but again this was already broken down to a certain degree I am hopeful by next week this will really start turnaround for [place]. We will also get a send him for an x-ray to evaluate for any signs of osteomyelitis on this lateral foot. 01-31-2022 upon evaluation today patient's  wound on the medial left amputation site is actually doing excellent. I am very pleased with where things stand this is significantly smaller and we are headed in the right direction. In regard to the left lateral foot there is still some necrotic tissue to be cleared away although I see evidence of this starting to basically solidify as far as the edges are concerned with how far it spread I do not see anything seeming to want to spread further. I believe this may be close to done as far as any evolving is concerned. 02-07-2022 upon evaluation today patient appears to be doing well with regard to the main amputation site. Unfortunately the secondary site on the left lateral foot is still giving him some trouble here. There is a lot of dried tissue and I do believe that along with the Dakin's we may want to use a contact layer in the base of the wound to try to prevent the bone from drying out because we are down to that point at this time. The patient voiced understanding at this point as well and is happy to do what ever it takes to try to get this better. With that being said I do believe that it is possible he may benefit from a wound VAC. Again the question is the approval process for getting this done obviously we can see what we can do if we get things cleaned a little bit better but right now I think the best option is going to be for [place] to continue with the packing currently. 02-14-2022 upon evaluation today patient presents for follow-up concerning ongoing issues with his foot. He did have the MRI which does show  that he has evidence of osteomyelitis of the digit at the fifth location of his foot. Subsequently this does extend down to the base of the digit and into the proximal portion of the metatarsal as well. With that being said the bone does not appear to be too severely broken down and a lot of the marrow signal is stated to be maintained which is good news. Nonetheless I do believe that he is unfortunately still in a spot where this is precarious and we need to make sure that we are treating this appropriately. He does seem to be doing well with IV antibiotics he also seems to be doing quite well when it comes to the overall appearance of the wound bed at this point. I am happy in that regard. 02-21-2022 upon evaluation today patient appears to be doing well all things considered in regard to his wounds. I do feel like that he is making some progress here. The wound on the right distal foot where the amputation site is is actually healing nicely I feel like we can switch over to collagen at this point that should do well for him. In regard to the left lateral foot this is actually getting much cleaner he still has some area where there seems to be some pressure getting to the location I think that he would do well with a knee scooter at this point. I discussed that with him he does not seem prone to going that route at this point he understands it can definitely help. 02-28-2022 upon evaluation today patient appears to be doing well currently with regard to the wound on his foot. The main surgical wound is actually significantly improved and the lateral wound is doing better there is still bone exposed but nonetheless this does not appear to  be significantly worse than what we have noted previous. In fact I think it looks better than it did last week. My goal is to keep the bone from drying out and subsequently for that reason we are going to go ahead and proceed with the EpiFix which I think is good to be  a good option here for him. Over top of the EpiFix we will do a contact layer in order to hopefully keep things from drying out. This dressing will actually stay in place until he comes back. Patient did see vascular yesterday and states that they did not feel like anything was doing worse still it appears that if it were left up to them they think the ideal thing would be to proceed with an amputation. I explained to the patient that to be honest is not always avoidable but if we can try to avoid amputation that is what we are after. He voiced understanding and he does want to do what ever he can to try to prevent amputation for that reason we are proceeding with the epi cord today. This will be application #1. [id number] upon evaluation today patient appears to be making some progress here regard to the lateral foot. Fortunately there does not appear to be any signs of active infection locally or systemically which is great news. No fevers, chills, nausea, vomiting, or diarrhea. I do think that he is appropriate for second application of EpiCord today. 03-14-2022 upon evaluation today patient's wound still has significant exposure of the bone at this point. He did get crutches he has been trying to use those the past couple of days am not sure that is going so well to be perfectly honest. We did discuss however offloading is important and we even discussed a total contact cast but to be perfectly honest right now and feels like he may need more debridement to clear away some of the bone which is not viable in the end of the foot of the fifth metatarsal but I am unable to really do that here in the office due to his bleeding. I do not know if potentially we could have him seen by podiatry potentially Dr. Merril Abbe to see if there is anything he can do to help in this regard. I am not even certain whether the majority of the metatarsal is even viable to be honest. Either way I do think that he would  benefit from surgical debridement or even possibly partial ray amputation in order for [place] to try to keep things moving in the right direction and get this closed. This was all discussed with the patient today. With that being said I know he wants to avoid a below-knee amputation which is what has previously been recommended. 03-21-2022 upon evaluation today patient appears to be doing well in regard to the medial wound at this is shown signs of excellent improvement. In regard to the lateral wound this is a bit dry compared to what I want to see him [name] not draining as much but I think that we need to see what we can do about trying to keep this from drying out and keeping it a bit more moist. Patient voiced understanding. Overall I am very pleased otherwise with where we stand. 03-27-2022 upon evaluation today patient appears to be doing well currently in regard to his wound especially on the medial aspect this looks really good the lateral aspect is looking a little bit better to with some healthier appearance  to the bone compared to last week to be honest. Fortunately I do not see any signs of active infection locally or systemically at this time which is great news. No fevers, chills, nausea, vomiting, or diarrhea. READMISSION 08/28/2022 This is a 57 year old man who is a diabetic on dialysis but not currently on any treatment for it. He has a known history of PAD. He was in this clinic for a protracted period of time this year for a left foot wound with underlying osteomyelitis for which she was treated with hyperbaric oxygen. As I understand things he left the clinic in June with wounds and an unhealed state possibly infected. 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 Unfortunately in September he required a revision of the BKA but he was ultimately admitted to [facility] for a protracted period with bacteremia secondary to group A  strep from 9/12-10/12. Marland Kitchen He is recently completed vancomycin he was taking at dialysis He has been left with a nonhealing surgical wound on his left BKA amputation site. He recently followed with vascular surgery who recommended Santyl and Vashe solution which they have been applying to the wound every day. It is noted that he does have severe PAD in the right lower extremity that is status post transmetatarsal amputation he is going to follow him up in 3 months. He is also seen infectious disease at [facility] who state that he had MRSA treated with cefazolin and vancomycin at hemodialysis. However he is listed as having an allergic reaction to cefazolin including lip swelling therefore he completed vancomycin. He is not felt to require any additional antibiotics. 09-04-2022 upon evaluation today patient appears to be doing well currently in regard to his wound on the medial aspect of this is amputation site where he had a BKA. On the lateral aspect however he has an area of the incision line that is open that actually goes somewhat deep. We did probe about 4 cm down although I do not feel any bone directly. Fortunately there does not appear to be any signs of active infection locally or systemically at this time. There is some concern here in my estimation about the possibility of infection and I think that he may need some extension of the antibiotics I [name] probably put him on linezolid as he was previously on vancomycin. 09-16-2022 upon evaluation today patient appears to be doing well currently in regard to his wound. There is good to be some need for sharp debridement today but fortunately nothing too significant. Fortunately I do not see any signs of active infection locally nor systemically at this time which is great news. No fevers, chills, nausea, vomiting, or diarrhea. 09-23-2022 upon evaluation today patient appears to be doing well currently in regard to his wounds that were still in the  cleaning up phase in regard to the primary wound where he dehisced. There is a lot of necrotic tissue that is can have to be cleaned out working on that at this point. 09-30-2022 upon evaluation today patient appears to be doing well currently in regard to his wounds. He is slowly making progress and getting the main open [place], Lynden [name] ([id number]) [id number] Page 8 of 10 area on the medial aspect of his incision line in a much healthier state. In general I feel like that we are headed in the right direction. I do not see any signs of active  infection at this time. 10-07-2022 upon evaluation today patient appears to be doing well currently in regard to his wound. He has been tolerating the dressing changes without complication of the lateral portion of the wound actually is closed the more medial portion is doing much better the swelling overall is excellent. He has been using the Tubigrip as a compression on the end of the stump and that has done extremely well for him. 12/26; unfortunately the patient's wound on the left BKA stump has a deeper area from 6-9 o'clock. There is not been any drainage she is not complaining of any pain. He states that he has been up in his wheelchair with his stump dependent quite a bit. This might account for why the edema in the stump seems out of control. 10-23-2022 upon evaluation today patient appears to be doing well currently in regard to his wound. This is actually showing signs of improvement I do believe he would benefit from the possibility of a snap VAC. Again we will get a check into his insurance to see if we can get approval. If we are able to that would definitely benefit him I do believe. Subsequently I will get him to look into this when she gets back in the office come Monday. 10-30-2022 upon evaluation today patient appears to be doing well in regard to his wound he still has some depth here but fortunately there does not  appear to be any signs of active infection locally nor systemically which is great news. No fevers, chills, nausea, vomiting, or diarrhea. 1/25; patient with a wound on the left BKA stump. Looking at this superficially healthy granulation the small rim of epithelialization however from 6-9 o'clock still roughly 1.2 cm of depth. There is no palpable bone and no observable drainage. The patient and his wife. We have been using silver alginate. 11-20-2022 upon evaluation today patient appears to be doing well with regard to his wound. He is showing signs of improvement although I think at this point the alginate probably is not necessary I think switching to a collagen based dressing would be perfect for him. The patient voiced understanding and is in agreement with the plan. Objective Constitutional Well-nourished and well-hydrated in no acute distress. Vitals Time Taken: 8:08 AM, Height: 70 in, Weight: 166 lbs, BMI: 23.8, Temperature: 98.2 F, Pulse: 71 bpm, Respiratory Rate: 16 breaths/min, Blood Pressure: 163/94 mmHg. Respiratory normal breathing without difficulty. Psychiatric this patient is able to make decisions and demonstrates good insight into disease process. Alert and Oriented x 3. pleasant and cooperative. General Notes: Upon inspection patient's wound bed actually showed signs of good granulation epithelization at this point. Fortunately I see no evidence of active infection locally nor systemically which is great news and I think that he is really doing quite well. The biggest thing I see is I do believe we should go ahead and get him started with a collagen based dressing which I think should do quite well for him. He is in agreement with that plan. Integumentary (Hair, Skin) Wound #3 status is Open. Original cause of wound was Surgical Injury. The date acquired was: 07/19/2022. The wound has been in treatment 12 weeks. 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  The wound measures 0.2cm length x 0.6cm width x 0.4cm depth; 0.094cm^2 area and 0.038cm^3 volume. There is Fat Layer (Subcutaneous Tissue) exposed. There is no tunneling or undermining noted. There is a medium amount of serosanguineous drainage noted.  There is medium (34-66%) pink granulation within the wound bed. There is a medium (34-66%) amount of necrotic tissue within the wound bed including Adherent Slough. 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 Bathing/ Shower/ Hygiene: May shower; gently cleanse wound with antibacterial soap, rinse and pat dry prior to dressing wounds Edema Control - Lymphedema / Segmental Compressive Device / Other: Tubigrip double layer applied - to stump and above knee WOUND #3: - Amputation Site - Below Knee Wound Laterality: Left Cleanser: [employer]ram Ancillary Kit - 15 Day Supply (DME) (Generic) 1 x Per Day/30 Days SHAWNDELL, SCHILLACI ([id number]) [id number] Page 9 of 10 Discharge Instructions: Use supplies as instructed; Kit contains: (15) Saline Bullets; (15) 3x3 Gauze; 15 pr Gloves Cleanser: Wound Cleanser (Generic) 1 x Per Day/30 Days Discharge Instructions: Wash your hands with soap and water. Remove old dressing, discard into plastic bag and place into trash. Cleanse the wound with Wound Cleanser prior to applying a clean dressing using gauze sponges, not tissues or cotton balls. Do not scrub or use excessive force. Pat dry using gauze sponges, not tissue or cotton balls. Prim Dressing: Prisma 4.34 (in) 1 x Per Day/30 Days ary Discharge Instructions: Moisten w/normal saline or sterile water; Cover wound as directed. Do not remove from wound  bed. Secondary Dressing: Gauze (DME) (Generic) 1 x Per Day/30 Days Secured With: Medipore T - 63M Medipore H Soft Cloth Surgical T ape ape, 2x2 (in/yd) (DME) (Generic) 1 x Per Day/30 Days Secured With: Tubigrip Size D, 3x10 (in/yd) 1 x Per Day/30 Days Discharge Instructions: double layer to thigh 1. I would recommend currently that we have the patient switch to a silver collagen dressing. I would like to see how this does over the next week or so. Depending on the results we could even switch to possibly endoform which I did not consider until after leaving the room. Nonetheless I think that could be a possibility and again he could benefit greatly from that. He is in agreement with the plan and we will subsequently see where things stand at follow-up. 2. I am good recommend as well that he continue with the gauze and tape to secure in place and that he is using Tubigrip to help with compression this has done well for his stump hopefully will be able to get him healed and into his prosthesis shortly. We will see patient back for reevaluation in 1 week here in the clinic. If anything worsens or changes patient will contact our office for additional recommendations. Electronic Signature(s) Signed: 11/20/2022 9:38:32 AM By: Worthy Keeler PA-C Entered By: Worthy Keeler on 11/20/2022 09:38:32 -------------------------------------------------------------------------------- SuperBill Details Patient Name: Date of Service: [name], [name] [name]. 11/20/2022 Medical Record Number: [id number] Patient Account Number: [id number] Date of Birth/Sex: Treating RN: August 07, 1966 (57 y.o. [name]) [name] Primary Care Provider: Nolene [name] Other Clinician: Referring Provider: Treating Provider/Extender: [name] in Treatment: 12 Diagnosis Coding ICD-10 Codes Code Description [phone] Non-pressure chronic ulcer of other part of left lower leg with other specified severity Z89.512 Acquired  absence of left leg below knee E11.51 Type 2 diabetes mellitus with diabetic peripheral angiopathy without gangrene L03.116 Cellulitis of left lower limb Facility Procedures : CPT4 Code: [id number] Description: 70962 - WOUND  CARE VISIT-LEV 3 EST PT Modifier: Quantity: 1 Physician Procedures Electronic Signature(s) Signed: 11/20/2022 9:38:53 AM By: Worthy Keeler PA-C Entered By: Worthy Keeler on 11/20/2022 09:38:53

## 2022-11-27 ENCOUNTER — Encounter: Payer: Medicare Other | Admitting: Physician Assistant

## 2022-11-27 DIAGNOSIS — E11621 Type 2 diabetes mellitus with foot ulcer: Secondary | ICD-10-CM | POA: Diagnosis not present

## 2022-11-27 NOTE — Progress Notes (Addendum)
[place], Goodman L ([id number]) [id number] Page 1 of 11 Visit Report for 11/27/2022 Chief Complaint Document Details Patient Name: Date of Service: [name] MES L. 11/27/2022 3:00 PM Medical Record Number: [id number] Patient Account Number: [id number] Date of Birth/Sex: Treating RN: 04/03/1966 (57 y.o. 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 11/27/2022 3:00 PM Medical Record Number: [id number] Patient Account Number: [id number] Date of Birth/Sex: Treating RN: 03/11/1966 (57 y.o. [name]) [name] Primary Care Provider: [name] Other Clinician: Referring Provider: Treating Provider/Extender: [name]3 Debridement Performed for Assessment: Wound #3 Left Amputation Site - Below Knee Performed By: Physician Tommie Sams., PA-C Debridement Type: Debridement Level of Consciousness (Pre-procedure): Awake and Alert Pre-procedure Verification/Time Out Yes - 15:46 Taken: T Area Debrided (L x W): otal 0.2 (cm) x 0.5 (cm) = 0.1 (cm) Tissue and other material debrided: Viable, Non-Viable, Slough, Subcutaneous, Slough Level: Skin/Subcutaneous Tissue Debridement Description: Excisional Instrument: Curette Bleeding: Minimum Hemostasis Achieved: Pressure End Time: 15:49 Procedural Pain: 0 Post Procedural Pain: 0 Response to Treatment: Procedure was tolerated well Level of Consciousness (Post- Awake and  Alert procedure): [place], [place] L ([id number]) [id number] Page 2 of 11 Post Debridement Measurements of Total Wound Length: (cm) 0.2 Width: (cm) 0.5 Depth: (cm) 0.7 Volume: (cm) 0.055 Character of Wound/Ulcer Post Debridement: Improved Post Procedure Diagnosis Same as Pre-procedure Electronic Signature(s) Signed: 11/27/2022 6:29:11 PM By: Worthy Keeler PA-C Signed: 11/28/2022 12:08:06 PM By: [name] RN Entered By: [name] on 11/27/2022 15:48:26 -------------------------------------------------------------------------------- HPI Details Patient Name: Date of Service: [name], JA MES L. 11/27/2022 3:00 PM Medical Record Number: [id number] Patient Account Number: [id number] Date of Birth/Sex: Treating RN: Feb 22, 1966 (57 y.o. [name] Primary Care Provider: [name] Other Clinician: Referring Provider: Treating Provider/Extender: [name]3 History of Present Illness HPI Description: 10/31/2021 patient presents today for evaluation here in our clinic. He is actually being evaluated for hyperbaric oxygen therapy only. He has been seen at [facility] up to this point he is under the care of the vascular surgery specialty. Subsequently he is also under the care of the hyperbaric center there. With that being said that due to the fact that he is a dialysis patient he was actually recommended to be transferred to [place] for his treatments he actually lives in [place] which is where his dialysis is. It was a hardship for him to try to get from [place] especially on dialysis days all the way to [facility] get into the facility for his treatment and get back home he was literally spending all day long. He has had just 4 treatments there at [facility] thus far based on what I see in these have not been continued while he awaits approval here in our clinic. Subsequently I do have records for review that will be  included in the HPI predetermination review and attached to this chart as well. I will not duplicate that here. 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 Nonetheless he continues to have exposed bone with marked soft tissue swelling compatible with osteomyelitis. This is due to the irregularity of the head of the metatarsal as well. Nonetheless along with having associated cellulitis he is also good to be seen by infectious disease and is currently on antibiotics for this as well. Again that will be detailed in the pretreatment review section which I will attach to this note as well. He does currently have a wound VAC in place and again the wound was not evaluated here in the clinic as that is being completely managed by home health and [facility]. The patient does have a history of diabetes mellitus type 2, hypertension, coronary artery disease, and congestive heart failure. He also has cataracts of both eyes but no evidence of glaucoma on his most recent eye exam. 11/22/2021 we have been seeing this gentleman for hyperbaric oxygen therapy but have not actually evaluated his wound up to this point this was being managed by the wound care and vascular team I presumed at [facility] at least that is what has been told to be previous. Nonetheless at this point I got a call from Atlee Abide who is a Librarian, academic at the [facility] vascular clinic with Dr. Durene Fruits and subsequently they were wanting to know if I could take over wound care for this patient. Apparently they do not have the ability for an office debridement which again I completely understand but nonetheless definitely is not an integral part of his healing along with the hyperbarics. Subsequently I discussed with the patient today that I do believe he would benefit going ahead and proceeding  with evaluation here in the clinic for that reason I did go ahead and see him today to get things started. There is also been some confusion about the issues with his home health nurse and getting measurements to [facility] for the wound VAC in order to continue his wound VAC therapy. With that being said after I see him today we will make a determination on what to do with wound VAC we can definitely send measurements to [facility] but again the bigger question is whether this is the best way to go or not. 11/28/2021 upon evaluation today patient appears to be doing decently well in regard to his wound. He has been tolerating the dressing changes with the Dakin's moistened gauze dressing which I think is doing a good job. Fortunately I do not see any signs of active infection locally nor systemically at this point. In general I think that he is actually making excellent progress which is great news. 2/17; seen in conjunction with HBO today. 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 Much improved condition of the wound surface. On the lateral foot and eschared area that he has been using Betadine. T olerating HBO well 12/13/2021 upon evaluation today patient appears to be doing pretty well in regard to his foot ulcer. I am actually much more pleased currently that I been with the way the appearance looks today. The wound bed does show some signs of slough and biofilm on the surface of the wound this is minimal I Minna perform debridement the I will do so very carefully due to the fact that to be honest the patient had a significant amount of bleeding in the past being on the Brilinta he had a difficult time clotting when I debrided him  previously quite significantly. Fortunately I do not see any signs of active infection at this time which is great news. No fevers, chills, nausea, vomiting, or diarrhea. 12/20/2021 upon evaluation today patient actually appears to be showing some signs  of excellent improvement here. I am very pleased with where things stand JERMEY, COMANS ([id number]) [id number] Page 3 of 11 currently. There does not appear to be any evidence of active infection locally nor systemically which is great news and I do believe that with the hyperbaric oxygen therapy he is actually making excellent progress. 3/9; patient presents for follow-up. He continues to do HBO therapy without issues. He has completed 34 out of 40 sessions. He continues to use Dakin's wet- to-dry to the amputation site and Betadine to the lateral foot wound. he currently denies signs of infection. 01/03/2022 upon evaluation today patient appears to be doing better in regard to his distal foot ulcer. Unfortunately the lateral foot is a different story this area has softened up and is getting need to be debrided away today. I Georgina Peer do this with scissors and forceps and was able to clear that out that will be detailed below. Otherwise I feel like the Dakin's moistened gauze dressing is doing an awesome job for him and he is making significantly improvements here. 01/10/2022 upon evaluation today patient continues to have some of the necrotic tissue loosen up on the lateral portion of his foot the main wound that we have been treating is actually doing quite well. Overall very pleased with where things stand in general. No fevers, chills, nausea, vomiting, or diarrhea. 01/17/2022 upon evaluation today patient appears to be doing somewhat poorly in regard to the lateral foot wound. The main wound that we initially were seeing him for looks to be doing great. This lateral foot has worsened and unfortunately it appears to potentially have some signs of infection here. I do believe that we may need to see about getting started on some IV antibiotics he is in dialysis so they could potentially do this for him, Monday. Nonetheless I want to try to get that started ASAP. Again I would  prefer him be initiated with IV vancomycin and cefepime just as an empiric broad-spectrum until we can get the culture results back at least. Subsequently also think that this should be done sooner rather than later due to the fact that his leg is more swollen and overall this seems to have progressed fairly rapidly since last week I do not want this to get worse. He voiced understanding. 01-24-2022 upon evaluation today patient appears to be doing well with regard to his main ulcer that we have been seeing him for. I am very pleased with where things stand currently. He still continues to have bleeding if we do any type of aggressive sharp debridement there for him having to be very careful using Dakin's solution along with debridement as I can here in the office. Overall the patient seems to be doing well. Unfortunately the lateral foot wound still has progressed a little bit further. We did get him on antibiotics starting this past Monday I think the drainage looks much better and overall I am seeing improvement but again this was already broken down to a certain degree I am hopeful by next week this will really start turnaround for [place]. We will also get a send him for an x-ray to evaluate for any signs of osteomyelitis on this lateral foot. 01-31-2022 upon evaluation today patient's wound on the  medial left amputation site is actually doing excellent. I am very pleased with where things stand this is significantly smaller and we are headed in the right direction. In regard to the left lateral foot there is still some necrotic tissue to be cleared away although I see evidence of this starting to basically solidify as far as the edges are concerned with how far it spread I do not see anything seeming to want to spread further. I believe this may be close to done as far as any evolving is concerned. 02-07-2022 upon evaluation today patient appears to be doing well with regard to the main amputation site.  Unfortunately the secondary site on the left lateral foot is still giving him some trouble here. There is a lot of dried tissue and I do believe that along with the Dakin's we may want to use a contact layer in the base of the wound to try to prevent the bone from drying out because we are down to that point at this time. The patient voiced understanding at this point as well and is happy to do what ever it takes to try to get this better. With that being said I do believe that it is possible he may benefit from a wound VAC. Again the question is the approval process for getting this done obviously we can see what we can do if we get things cleaned a little bit better but right now I think the best option is going to be for [place] to continue with the packing currently. 02-14-2022 upon evaluation today patient presents for follow-up concerning ongoing issues with his foot. He did have the MRI which does show that he has evidence of osteomyelitis of the digit at the fifth location of his foot. Subsequently this does extend down to the base of the digit and into the proximal portion of the metatarsal as well. With that being said the bone does not appear to be too severely broken down and a lot of the marrow signal is stated to be maintained which is good news. Nonetheless I do believe that he is unfortunately still in a spot where this is precarious and we need to make sure that we are treating this appropriately. He does seem to be doing well with IV antibiotics he also seems to be doing quite well when it comes to the overall appearance of the wound bed at this point. I am happy in that regard. 02-21-2022 upon evaluation today patient appears to be doing well all things considered in regard to his wounds. I do feel like that he is making some progress here. The wound on the right distal foot where the amputation site is is actually healing nicely I feel like we can switch over to collagen at this point that  should do well for him. In regard to the left lateral foot this is actually getting much cleaner he still has some area where there seems to be some pressure getting to the location I think that he would do well with a knee scooter at this point. I discussed that with him he does not seem prone to going that route at this point he understands it can definitely help. 02-28-2022 upon evaluation today patient appears to be doing well currently with regard to the wound on his foot. The main surgical wound is actually significantly improved and the lateral wound is doing better there is still bone exposed but nonetheless this does not appear to be significantly  worse than what we have noted previous. In fact I think it looks better than it did last week. My goal is to keep the bone from drying out and subsequently for that reason we are going to go ahead and proceed with the EpiFix which I think is good to be a good option here for him. Over top of the EpiFix we will do a contact layer in order to hopefully keep things from drying out. This dressing will actually stay in place until he comes back. Patient did see vascular yesterday and states that they did not feel like anything was doing worse still it appears that if it were left up to them they think the ideal thing would be to proceed with an amputation. I explained to the patient that to be honest is not always avoidable but if we can try to avoid amputation that is what we are after. He voiced understanding and he does want to do what ever he can to try to prevent amputation for that reason we are proceeding with the epi cord today. This will be application #1. 0000000 upon evaluation today patient appears to be making some progress here regard to the lateral foot. Fortunately there does not appear to be any signs of active infection locally or systemically which is great news. No fevers, chills, nausea, vomiting, or diarrhea. I do think that he is  appropriate for second application of EpiCord today. 03-14-2022 upon evaluation today patient's wound still has significant exposure of the bone at this point. He did get crutches he has been trying to use those the past couple of days am not sure that is going so well to be perfectly honest. We did discuss however offloading is important and we even discussed a total contact cast but to be perfectly honest right now and feels like he may need more debridement to clear away some of the bone which is not viable in the end of the foot of the fifth metatarsal but I am unable to really do that here in the office due to his bleeding. I do not know if potentially we could have him seen by podiatry potentially Dr. Merril Abbe to see if there is anything he can do to help in this regard. I am not even certain whether the majority of the metatarsal is even viable to be honest. Either way I do think that he would benefit from surgical debridement or even possibly partial ray amputation in order for [place] to try to keep things moving in the right direction and get this closed. This was all discussed with the patient today. With that being said I know he wants to avoid a below-knee amputation which is what has previously been recommended. 03-21-2022 upon evaluation today patient appears to be doing well in regard to the medial wound at this is shown signs of excellent improvement. In regard to the lateral wound this is a bit dry compared to what I want to see him Regino Schultze not draining as much but I think that we need to see what we can do about trying to keep this from drying out and keeping it a bit more moist. Patient voiced understanding. Overall I am very pleased otherwise with where we stand. 03-27-2022 upon evaluation today patient appears to be doing well currently in regard to his wound especially on the medial aspect this looks really good the lateral aspect is looking a little bit better to with some healthier  appearance to the  bone compared to last week to be honest. Fortunately I do not see any signs of active infection locally or systemically at this time which is great news. No fevers, chills, nausea, vomiting, or diarrhea. READMISSION 08/28/2022 This is a 57 year old man who is a diabetic on dialysis but not currently on any treatment for it. He has a known history of PAD. He was in this clinic for a protracted period of time this year for a left foot wound with underlying osteomyelitis for which she was treated with hyperbaric oxygen. As I understand things he left the clinic in June with wounds and an unhealed state possibly infected. 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 Unfortunately in September he required a revision of the BKA but he was ultimately admitted to [facility] for a protracted period with bacteremia secondary to group A strep from 9/12-10/12. Marland Kitchen He is recently completed vancomycin he was taking at dialysis He has been left with a nonhealing surgical wound on his left BKA amputation site. He recently followed with vascular surgery who recommended [place] and [place], [place] L ([id number]) [id number].pdf Page 4 of 11 Vashe solution which they have been applying to the wound every day. It is noted that he does have severe PAD in the right lower extremity that is status post transmetatarsal amputation he is going to follow him up in 3 months. He is also seen infectious disease at [facility] who state that he had MRSA treated with cefazolin and vancomycin at hemodialysis. However he is listed as having an allergic reaction to cefazolin including lip swelling therefore he completed vancomycin. He is not felt to require any additional antibiotics. 09-04-2022 upon evaluation today patient appears to be doing well currently in regard to his wound on the medial aspect of this is amputation site where he had a BKA. On  the lateral aspect however he has an area of the incision line that is open that actually goes somewhat deep. We did probe about 4 cm down although I do not feel any bone directly. Fortunately there does not appear to be any signs of active infection locally or systemically at this time. There is some concern here in my estimation about the possibility of infection and I think that he may need some extension of the antibiotics I Georgina Peer probably put him on linezolid as he was previously on vancomycin. 09-16-2022 upon evaluation today patient appears to be doing well currently in regard to his wound. There is good to be some need for sharp debridement today but fortunately nothing too significant. Fortunately I do not see any signs of active infection locally nor systemically at this time which is great news. No fevers, chills, nausea, vomiting, or diarrhea. 09-23-2022 upon evaluation today patient appears to be doing well currently in regard to his wounds that were still in the cleaning up phase in regard to the primary wound where he dehisced. There is a lot of necrotic tissue that is can have to be cleaned out working on that at this point. 09-30-2022 upon evaluation today patient appears to be doing well currently in regard to his wounds. He is slowly making progress and getting the main open area on the medial aspect of his incision line in a much healthier state. In general I feel like that we are headed in the right direction. I do not see any signs of active infection at this  time. 10-07-2022 upon evaluation today patient appears to be doing well currently in regard to his wound. He has been tolerating the dressing changes without complication of the lateral portion of the wound actually is closed the more medial portion is doing much better the swelling overall is excellent. He has been using the Tubigrip as a compression on the end of the stump and that has done extremely well for him. 12/26;  unfortunately the patient's wound on the left BKA stump has a deeper area from 6-9 o'clock. There is not been any drainage she is not complaining of any pain. He states that he has been up in his wheelchair with his stump dependent quite a bit. This might account for why the edema in the stump seems out of control. 10-23-2022 upon evaluation today patient appears to be doing well currently in regard to his wound. This is actually showing signs of improvement I do believe he would benefit from the possibility of a snap VAC. Again we will get a check into his insurance to see if we can get approval. If we are able to that would definitely benefit him I do believe. Subsequently I will get him to look into this when she gets back in the office come Monday. 10-30-2022 upon evaluation today patient appears to be doing well in regard to his wound he still has some depth here but fortunately there does not appear to be any signs of active infection locally nor systemically which is great news. No fevers, chills, nausea, vomiting, or diarrhea. 1/25; patient with a wound on the left BKA stump. Looking at this superficially healthy granulation the small rim of epithelialization however from 6-9 o'clock still roughly 1.2 cm of depth. There is no palpable bone and no observable drainage. The patient and his wife. We have been using silver alginate. 11-20-2022 upon evaluation today patient appears to be doing well with regard to his wound. He is showing signs of improvement although I think at this point the alginate probably is not necessary I think switching to a collagen based dressing would be perfect for him. The patient voiced understanding and is in agreement with the plan. 11-27-2022 upon evaluation today patient appears to be doing excellent in regard to his wound although the collagen is getting very dry and stuck into the wound bed. Fortunately I do not see any signs of infection locally nor systemically which  is great news and overall I am pleased in that regard. No fevers, chills, nausea, vomiting, or diarrhea. Electronic Signature(s) Signed: 11/27/2022 5:48:51 PM By: Worthy Keeler PA-C Entered By: Worthy Keeler on 11/27/2022 17:48:50 -------------------------------------------------------------------------------- Physical Exam Details Patient Name: Date of Service: [name] MES L. 11/27/2022 3:00 PM Medical Record Number: [id number] Patient Account Number: [id number] Date of Birth/Sex: Treating RN: June 12, 1966 (57 y.o. [name] Primary Care Provider: [name] Other Clinician: Referring Provider: Treating Provider/Extender: [name] in Treatment: 39 Constitutional Well-nourished and well-hydrated in no acute distress. Respiratory normal breathing without difficulty. Psychiatric this patient is able to make decisions and demonstrates good insight into disease process. Alert and Oriented x 3. pleasant and cooperative. [place], Lued[place] L ([id number]) [id number] Page 5 of 11 Notes Upon inspection patient's wound bed actually showed signs of some need for sharp debridement unfortunately. I did perform debridement of clearway the necrotic debris down to good subcutaneous tissue he tolerated this without complication. Postdebridement the wound that is improved I am hopeful to keep this from  building up again to change the dressing. Electronic Signature(s) Signed: 11/27/2022 5:49:08 PM By: Worthy Keeler PA-C Entered By: Worthy Keeler on 11/27/2022 17:49:08 -------------------------------------------------------------------------------- Physician Orders Details Patient Name: Date of Service: [name], JA MES L. 11/27/2022 3:00 PM Medical Record Number: [id number] Patient Account Number: [id number] Date of Birth/Sex: Treating RN: 1966/03/28 (57 y.o. [name]) [name] Primary Care Provider: [name] Other Clinician: Referring  Provider: Treating Provider/Extender: [name] in Treatment: [phone] Verbal / Phone Orders: No Diagnosis Coding ICD-10 Coding Code Description [phone] Non-pressure chronic ulcer of other part of left lower leg with other specified severity Z89.512 Acquired absence of left leg below knee E11.51 Type 2 diabetes mellitus with diabetic peripheral angiopathy without gangrene L03.116 Cellulitis of left lower limb Follow-up Appointments Return Appointment in 1 week. Bathing/ Shower/ Hygiene May shower; gently cleanse wound with antibacterial soap, rinse and pat dry prior to dressing wounds Edema Control - Lymphedema / Segmental Compressive Device / Other Tubigrip double layer applied - to stump and above knee Wound Treatment Wound #3 - Amputation Site - Below Knee Wound Laterality: Left Cleanser: Byram Ancillary Kit - 15 Day Supply (Generic) 1 x Per Day/30 Days Discharge Instructions: Use supplies as instructed; Kit contains: (15) Saline Bullets; (15) 3x3 Gauze; 15 pr Gloves Cleanser: Wound Cleanser (Generic) 1 x Per Day/30 Days Discharge Instructions: Wash your hands with soap and water. Remove old dressing, discard into plastic bag and place into trash. Cleanse the wound with Wound Cleanser prior to applying a clean dressing using gauze sponges, not tissues or cotton balls. Do not scrub or use excessive force. Pat dry using gauze sponges, not tissue or cotton balls. Prim Dressing: Xeroform-HBD 2x2 (in/in) 1 x Per Day/30 Days ary Discharge Instructions: Apply Xeroform-HBD 2x2 (in/in) as directed Secondary Dressing: Gauze (Generic) 1 x Per Day/30 Days Secured With: Medipore T - 60M Medipore H Soft Cloth Surgical T ape ape, 2x2 (in/yd) (Generic) 1 x Per Day/30 Days Secured With: Tubigrip Size D, 3x10 (in/yd) 1 x Per Day/30 Days Discharge Instructions: double layer to thigh MAHDI, HALTIWANGER ([id number]) [id number] Page 6 of 11 Electronic  Signature(s) Signed: 11/27/2022 6:29:11 PM By: Worthy Keeler PA-C Signed: 11/28/2022 12:08:06 PM By: [name] RN Entered By: [name] on 11/27/2022 15:51:13 -------------------------------------------------------------------------------- Problem List Details Patient Name: Date of Service: [name], JA MES L. 11/27/2022 3:00 PM Medical Record Number: [id number] Patient Account Number: [id number] Date of Birth/Sex: Treating RN: [birth year] (57 y.o. [name] Primary Care Provider: [name] Other Clinician: Referring Provider: Treating Provider/Extender: [name]3 Active Problems ICD-10 Encounter Code Description Active Date MDM Diagnosis [phone] Non-pressure chronic ulcer of other part of left lower leg with other specified 08/28/2022 No Yes severity Z89.512 Acquired absence of left leg below knee 08/28/2022 No Yes E11.51 Type 2 diabetes mellitus with diabetic peripheral angiopathy without gangrene 08/28/2022 No Yes L03.116 Cellulitis of left lower limb 08/28/2022 No Yes Inactive Problems Resolved Problems Electronic Signature(s) Signed: 11/27/2022 3:28:52 PM By: Worthy Keeler PA-C Entered By: Worthy Keeler on 11/27/2022 15:28:52 Progress Note Details -------------------------------------------------------------------------------- Valla Leaver ([id number]) [id number] Page 7 of 11 Patient Name: Date of Service: [name] MES L. 11/27/2022 3:00 PM Medical Record Number: [id number] Patient Account Number: [id number] Date of Birth/Sex: Treating RN: 16-Aug-1966 (57 y.o. 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 He is actually being evaluated for hyperbaric oxygen therapy only. He has been seen at [facility] up to this point he is under the care of the vascular surgery specialty. Subsequently he is also under the care of the hyperbaric center there. With that being said that due to the fact that he is a dialysis patient he was actually recommended to be transferred to [place] for his treatments he actually lives in [place] which is where his dialysis is. It was a hardship for him to try to get from [place] especially on dialysis days all the way to [facility] get into the facility for his treatment and get back home he was literally spending all day long. He has had just 4 treatments there at [facility] thus far based on what I see in these have not been continued while he awaits approval here in our clinic. Subsequently I do have records for review that will be included in the HPI predetermination review and attached to this chart as well. I will not duplicate that here. 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 Nonetheless he continues to have exposed bone with marked soft tissue swelling compatible with osteomyelitis. This is due to the irregularity of the head of the metatarsal as well. Nonetheless along with having associated cellulitis he is also good to be seen by infectious disease and is currently on antibiotics for this as well. Again that will be detailed in the pretreatment review section which I will attach to this note as well. He does currently have a wound VAC in place and again the wound was not evaluated here in the clinic as that is being completely managed by home health and [facility]. The patient does have a history of diabetes mellitus type 2,  hypertension, coronary artery disease, and congestive heart failure. He also has cataracts of both eyes but no evidence of glaucoma on his most recent eye exam. 11/22/2021 we have been seeing this gentleman for hyperbaric oxygen therapy but have not actually evaluated his wound up to this point this was being managed by the wound care and vascular team I presumed at [place] at least that is what has been told to be previous. Nonetheless at this point I got a call from Atlee Abide who is a Librarian, academic at the [facility] vascular clinic with Dr. Durene Fruits and subsequently they were wanting to know if I could take over wound care for this patient. Apparently they do not have the ability for an office debridement which again I completely understand but nonetheless definitely is not an integral part of his healing along with the hyperbarics. Subsequently I discussed with the patient today that I do believe he would benefit going ahead and proceeding with evaluation here in the clinic for that reason I did go ahead and see him today to get things started. There is also been some confusion about the issues with his home health nurse and getting measurements to [facility] for the wound VAC in order to continue his wound VAC therapy. With that being said after I see him today we will make a determination on what to do with  wound VAC we can definitely send measurements to [facility] but again the bigger question is whether this is the best way to go or not. 11/28/2021 upon evaluation today patient appears to be doing decently well in regard to his wound. He has been tolerating the dressing changes with the Dakin's moistened gauze dressing which I think is doing a good job. Fortunately I do not see any signs of active infection locally nor systemically at this point. In general I think that he is actually making excellent progress which is great news. 2/17; seen in conjunction with HBO today. He is using Dakin's moistened gauze in  the major TMA site wound on the left. Much improved condition of the wound surface. On the lateral foot and eschared area that he has been using Betadine. T olerating HBO well 12/13/2021 upon evaluation today patient appears to be doing pretty well in regard to his foot ulcer. I am actually much more pleased currently that I been with the way the appearance looks today. The wound bed does show some signs of slough and biofilm on the surface of the wound this is minimal I Minna perform debridement the I will do so very carefully due to the fact that to be honest the patient had a significant amount of bleeding in the past being on the Brilinta he had a difficult time clotting when I debrided him previously quite significantly. Fortunately I do not see any signs of active infection at this time which is great news. No fevers, chills, nausea, vomiting, or diarrhea. 12/20/2021 upon evaluation today patient actually appears to be showing some signs of excellent improvement here. I am very pleased with where things stand currently. There does not appear to be any evidence of active infection locally nor systemically which is great news and I do believe that with the hyperbaric oxygen therapy he is actually making excellent progress. 3/9; patient presents for follow-up. He continues to do HBO therapy without issues. He has completed 34 out of 40 sessions. He continues to use Dakin's wet- to-dry to the amputation site and Betadine to the lateral foot wound. he currently denies signs of infection. 01/03/2022 upon evaluation today patient appears to be doing better in regard to his distal foot ulcer. Unfortunately the lateral foot is a different story this area has softened up and is getting need to be debrided away today. I Georgina Peer do this with scissors and forceps and was able to clear that out that will be detailed below. Otherwise I feel like the Dakin's moistened gauze dressing is doing an awesome job for him and  he is making significantly improvements here. 01/10/2022 upon evaluation today patient continues to have some of the necrotic tissue loosen up on the lateral portion of his foot the main wound that we have been treating is actually doing quite well. Overall very pleased with where things stand in general. No fevers, chills, nausea, vomiting, or diarrhea. 01/17/2022 upon evaluation today patient appears to be doing somewhat poorly in regard to the lateral foot wound. The main wound that we initially were seeing him for looks to be doing great. This lateral foot has worsened and unfortunately it appears to potentially have some signs of infection here. I do believe that we may need to see about getting started on some IV antibiotics he is in dialysis so they could potentially do this for him, Monday. Nonetheless I want to try to get that started ASAP. Again I would prefer him be initiated with  IV vancomycin and cefepime just as an empiric broad-spectrum until we can get the culture results back at least. Subsequently also think that this should be done sooner rather than later due to the fact that his leg is more swollen and overall this seems to have progressed fairly rapidly since last week I do not want this to get worse. He voiced understanding. 01-24-2022 upon evaluation today patient appears to be doing well with regard to his main ulcer that we have been seeing him for. I am very pleased with where things stand currently. He still continues to have bleeding if we do any type of aggressive sharp debridement there for him having to be very careful using Dakin's solution along with debridement as I can here in the office. Overall the patient seems to be doing well. Unfortunately the lateral foot wound still has progressed a little bit further. We did get him on antibiotics starting this past Monday I think the drainage looks much better and overall I am seeing improvement but again this was already broken  down to a certain degree I am hopeful by next week this will really start turnaround for [place]. We will also get a send him for an x-ray to evaluate for any signs of osteomyelitis on this lateral foot. 01-31-2022 upon evaluation today patient's wound on the medial left amputation site is actually doing excellent. I am very pleased with where things stand this is significantly smaller and we are headed in the right direction. In regard to the left lateral foot there is still some necrotic tissue to be cleared away although I see evidence of this starting to basically solidify as far as the edges are concerned with how far it spread I do not see anything seeming to want to spread further. I believe this may be close to done as far as any evolving is concerned. 02-07-2022 upon evaluation today patient appears to be doing well with regard to the main amputation site. Unfortunately the secondary site on the left lateral foot is still giving him some trouble here. There is a lot of dried tissue and I do believe that along with the Dakin's we may want to use a contact layer in the base of the wound to try to prevent the bone from drying out because we are down to that point at this time. The patient voiced understanding at this point as well and is happy to do what ever it takes to try to get this better. With that being said I do believe that it is possible he may benefit from a wound VAC. Again TERRIAN, DURYEE ([id number]) [id number] Page 8 of 11 the question is the approval process for getting this done obviously we can see what we can do if we get things cleaned a little bit better but right now I think the best option is going to be for [place] to continue with the packing currently. 02-14-2022 upon evaluation today patient presents for follow-up concerning ongoing issues with his foot. He did have the MRI which does show that he has evidence of osteomyelitis of the digit at the fifth  location of his foot. Subsequently this does extend down to the base of the digit and into the proximal portion of the metatarsal as well. With that being said the bone does not appear to be too severely broken down and a lot of the marrow signal is stated to be maintained which is good news. Nonetheless I do believe that he  is unfortunately still in a spot where this is precarious and we need to make sure that we are treating this appropriately. He does seem to be doing well with IV antibiotics he also seems to be doing quite well when it comes to the overall appearance of the wound bed at this point. I am happy in that regard. 02-21-2022 upon evaluation today patient appears to be doing well all things considered in regard to his wounds. I do feel like that he is making some progress here. The wound on the right distal foot where the amputation site is is actually healing nicely I feel like we can switch over to collagen at this point that should do well for him. In regard to the left lateral foot this is actually getting much cleaner he still has some area where there seems to be some pressure getting to the location I think that he would do well with a knee scooter at this point. I discussed that with him he does not seem prone to going that route at this point he understands it can definitely help. 02-28-2022 upon evaluation today patient appears to be doing well currently with regard to the wound on his foot. The main surgical wound is actually significantly improved and the lateral wound is doing better there is still bone exposed but nonetheless this does not appear to be significantly worse than what we have noted previous. In fact I think it looks better than it did last week. My goal is to keep the bone from drying out and subsequently for that reason we are going to go ahead and proceed with the EpiFix which I think is good to be a good option here for him. Over top of the EpiFix we will do a  contact layer in order to hopefully keep things from drying out. This dressing will actually stay in place until he comes back. Patient did see vascular yesterday and states that they did not feel like anything was doing worse still it appears that if it were left up to them they think the ideal thing would be to proceed with an amputation. I explained to the patient that to be honest is not always avoidable but if we can try to avoid amputation that is what we are after. He voiced understanding and he does want to do what ever he can to try to prevent amputation for that reason we are proceeding with the epi cord today. This will be application #1. 0000000 upon evaluation today patient appears to be making some progress here regard to the lateral foot. Fortunately there does not appear to be any signs of active infection locally or systemically which is great news. No fevers, chills, nausea, vomiting, or diarrhea. I do think that he is appropriate for second application of EpiCord today. 03-14-2022 upon evaluation today patient's wound still has significant exposure of the bone at this point. He did get crutches he has been trying to use those the past couple of days am not sure that is going so well to be perfectly honest. We did discuss however offloading is important and we even discussed a total contact cast but to be perfectly honest right now and feels like he may need more debridement to clear away some of the bone which is not viable in the end of the foot of the fifth metatarsal but I am unable to really do that here in the office due to his bleeding. I do not know if  potentially we could have him seen by podiatry potentially Dr. Merril Abbe to see if there is anything he can do to help in this regard. I am not even certain whether the majority of the metatarsal is even viable to be honest. Either way I do think that he would benefit from surgical debridement or even possibly partial ray  amputation in order for [place] to try to keep things moving in the right direction and get this closed. This was all discussed with the patient today. With that being said I know he wants to avoid a below-knee amputation which is what has previously been recommended. 03-21-2022 upon evaluation today patient appears to be doing well in regard to the medial wound at this is shown signs of excellent improvement. In regard to the lateral wound this is a bit dry compared to what I want to see him Regino Schultze not draining as much but I think that we need to see what we can do about trying to keep this from drying out and keeping it a bit more moist. Patient voiced understanding. Overall I am very pleased otherwise with where we stand. 03-27-2022 upon evaluation today patient appears to be doing well currently in regard to his wound especially on the medial aspect this looks really good the lateral aspect is looking a little bit better to with some healthier appearance to the bone compared to last week to be honest. Fortunately I do not see any signs of active infection locally or systemically at this time which is great news. No fevers, chills, nausea, vomiting, or diarrhea. READMISSION 08/28/2022 This is a 57 year old man who is a diabetic on dialysis but not currently on any treatment for it. He has a known history of PAD. He was in this clinic for a protracted period of time this year for a left foot wound with underlying osteomyelitis for which she was treated with hyperbaric oxygen. As I understand things he left the clinic in June with wounds and an unhealed state possibly infected. 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 Unfortunately in September he required a revision of the BKA but he was ultimately admitted to [facility] for a protracted period with bacteremia secondary to group A strep from 9/12-10/12. Marland Kitchen He is recently completed vancomycin he was  taking at dialysis He has been left with a nonhealing surgical wound on his left BKA amputation site. He recently followed with vascular surgery who recommended Santyl and Vashe solution which they have been applying to the wound every day. It is noted that he does have severe PAD in the right lower extremity that is status post transmetatarsal amputation he is going to follow him up in 3 months. He is also seen infectious disease at [facility] who state that he had MRSA treated with cefazolin and vancomycin at hemodialysis. However he is listed as having an allergic reaction to cefazolin including lip swelling therefore he completed vancomycin. He is not felt to require any additional antibiotics. 09-04-2022 upon evaluation today patient appears to be doing well currently in regard to his wound on the medial aspect of this is amputation site where he had a BKA. On the lateral aspect however he has an area of the incision line that is open that actually goes somewhat deep. We did probe about 4 cm down although I do not feel any bone directly. Fortunately there does not appear  to be any signs of active infection locally or systemically at this time. There is some concern here in my estimation about the possibility of infection and I think that he may need some extension of the antibiotics I Georgina Peer probably put him on linezolid as he was previously on vancomycin. 09-16-2022 upon evaluation today patient appears to be doing well currently in regard to his wound. There is good to be some need for sharp debridement today but fortunately nothing too significant. Fortunately I do not see any signs of active infection locally nor systemically at this time which is great news. No fevers, chills, nausea, vomiting, or diarrhea. 09-23-2022 upon evaluation today patient appears to be doing well currently in regard to his wounds that were still in the cleaning up phase in regard to the primary wound where he dehisced.  There is a lot of necrotic tissue that is can have to be cleaned out working on that at this point. 09-30-2022 upon evaluation today patient appears to be doing well currently in regard to his wounds. He is slowly making progress and getting the main open area on the medial aspect of his incision line in a much healthier state. In general I feel like that we are headed in the right direction. I do not see any signs of active infection at this time. 10-07-2022 upon evaluation today patient appears to be doing well currently in regard to his wound. He has been tolerating the dressing changes without complication of the lateral portion of the wound actually is closed the more medial portion is doing much better the swelling overall is excellent. He has been using the Tubigrip as a compression on the end of the stump and that has done extremely well for him. 12/26; unfortunately the patient's wound on the left BKA stump has a deeper area from 6-9 o'clock. There is not been any drainage she is not complaining of any pain. He states that he has been up in his wheelchair with his stump dependent quite a bit. This might account for why the edema in the stump seems out of control. 10-23-2022 upon evaluation today patient appears to be doing well currently in regard to his wound. This is actually showing signs of improvement I do believe he would benefit from the possibility of a snap VAC. Again we will get a check into his insurance to see if we can get approval. If we are able to that would definitely benefit him I do believe. Subsequently I will get him to look into this when she gets back in the office come Monday. 10-30-2022 upon evaluation today patient appears to be doing well in regard to his wound he still has some depth here but fortunately there does not appear to be any signs of active infection locally nor systemically which is great news. No fevers, chills, nausea, vomiting, or diarrhea. 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 Looking at this superficially healthy granulation the small rim of epithelialization however from 6-9 o'clock still roughly 1.2 cm of depth. There is no palpable bone and no observable drainage. The patient and his wife. We have been using silver alginate. 11-20-2022 upon evaluation today patient appears to be doing well with regard to his wound. He is showing signs of improvement although I think at this point the alginate probably is not necessary I think switching to a collagen based dressing would be perfect for him.  The patient voiced understanding and is in agreement with the plan. 11-27-2022 upon evaluation today patient appears to be doing excellent in regard to his wound although the collagen is getting very dry and stuck into the wound bed. Fortunately I do not see any signs of infection locally nor systemically which is great news and overall I am pleased in that regard. No fevers, chills, nausea, vomiting, or diarrhea. Objective Constitutional Well-nourished and well-hydrated in no acute distress. Vitals Time Taken: 3:16 PM, Height: 70 in, Weight: 166 lbs, BMI: 23.8, Temperature: 98.5 F, Pulse: 71 bpm, Respiratory Rate: 18 breaths/min, Blood Pressure: 160/93 mmHg. Respiratory normal breathing without difficulty. Psychiatric this patient is able to make decisions and demonstrates good insight into disease process. Alert and Oriented x 3. pleasant and cooperative. General Notes: Upon inspection patient's wound bed actually showed signs of some need for sharp debridement unfortunately. I did perform debridement of clearway the necrotic debris down to good subcutaneous tissue he tolerated this without complication. Postdebridement the wound that is improved I am hopeful to keep this from building up again to change the dressing. Integumentary (Hair, Skin) Wound #3 status is  Open. Original cause of wound was Surgical Injury. The date acquired was: 07/19/2022. The wound has been in treatment 13 weeks. 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 The wound measures 0.2cm length x 0.5cm width x 0.7cm depth; 0.079cm^2 area and 0.055cm^3 volume. There is Fat Layer (Subcutaneous Tissue) exposed. There is no tunneling or undermining noted. There is a medium amount of serosanguineous drainage noted. There is medium (34-66%) pink granulation within the wound bed. There is a medium (34-66%) amount of necrotic tissue within the wound bed including Adherent Slough. 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 There was a Excisional Skin/Subcutaneous Tissue Debridement with a total area of 0.1 sq cm performed by Tommie Sams., PA-C. With the following instrument(s): Curette to remove Viable and Non- Viable tissue/material. Material removed includes Subcutaneous Tissue and Slough and. No specimens were taken. A time out was conducted at 15:46, prior to the start of the procedure. A Minimum amount of bleeding was controlled with Pressure. The procedure was tolerated well with a pain level of 0 throughout and a pain level of 0 following the procedure. Post Debridement Measurements: 0.2cm length x 0.5cm width x 0.7cm depth; 0.055cm^3 volume. Character of Wound/Ulcer Post Debridement is improved. Post procedure Diagnosis Wound #3: Same as Pre-Procedure Plan Follow-up Appointments: Return Appointment in 1 week. Bathing/ Shower/ Hygiene: May shower; gently cleanse wound with antibacterial soap, rinse and pat dry prior  to dressing wounds Kreisler, Law L ([id number]) [phone].pdf Page 10 of 11 Edema Control - Lymphedema / Segmental Compressive Device / Other: Tubigrip double layer applied - to stump and above knee WOUND #3: - Amputation Site - Below Knee Wound Laterality: Left Cleanser: Byram Ancillary Kit - 15 Day Supply (Generic) 1 x Per Day/30 Days Discharge Instructions: Use supplies as instructed; Kit contains: (15) Saline Bullets; (15) 3x3 Gauze; 15 pr Gloves Cleanser: Wound Cleanser (Generic) 1 x Per Day/30 Days Discharge Instructions: Wash your hands with soap and water. Remove old dressing, discard into plastic bag and  place into trash. Cleanse the wound with Wound Cleanser prior to applying a clean dressing using gauze sponges, not tissues or cotton balls. Do not scrub or use excessive force. Pat dry using gauze sponges, not tissue or cotton balls. Prim Dressing: Xeroform-HBD 2x2 (in/in) 1 x Per Day/30 Days ary Discharge Instructions: Apply Xeroform-HBD 2x2 (in/in) as directed Secondary Dressing: Gauze (Generic) 1 x Per Day/30 Days Secured With: Medipore T - 70M Medipore H Soft Cloth Surgical T ape ape, 2x2 (in/yd) (Generic) 1 x Per Day/30 Days Secured With: Tubigrip Size D, 3x10 (in/yd) 1 x Per Day/30 Days Discharge Instructions: double layer to thigh 1. I am going to recommend currently that we switch a Xeroform gauze dressing which I think is going to be much better to both, packing to keep this open so that it does not close up which has been our problem at this point. We did put a little bit of pressure dressing on today with the Xeroform although they are to change that at 6:00 leaving the Xeroform but changed out the outer dressing so we will be putting so much pressure on the wound bed. 2. I am to recommend this be changed daily. 3 I am also going to suggest that the patient should continue to monitor for any signs of infection or worsening. Obviously if anything  changes he knows contact the office and let me know. We will see patient back for reevaluation in 1 week here in the clinic. If anything worsens or changes patient will contact our office for additional recommendations. Electronic Signature(s) Signed: 11/27/2022 5:49:46 PM By: Worthy Keeler PA-C Entered By: Worthy Keeler on 11/27/2022 17:49:46 -------------------------------------------------------------------------------- SuperBill Details Patient Name: Date of Service: [name], [name] MES L. 11/27/2022 Medical Record Number: [id number] Patient Account Number: [id number] Date of Birth/Sex: Treating RN: [birth year] (57 y.o. [name]) [name] Primary Care Provider: [name] Other Clinician: Referring Provider: Treating Provider/Extender: [name]3 Diagnosis Coding ICD-10 Codes Code Description [phone] Non-pressure chronic ulcer of other part of left lower leg with other specified severity Z89.512 Acquired absence of left leg below knee E11.51 Type 2 diabetes mellitus with diabetic peripheral angiopathy without gangrene L03.116 Cellulitis of left lower limb Facility Procedures : CPT4 Code: [id number] Description: [id number] - DEB SUBQ TISSUE 20 SQ CM/< ICD-10 Diagnosis Description L97.828 Non-pressure chronic ulcer of other part of left lower leg with other specified Modifier: severity Quantity: 1 Physician Procedures : CPT4 Code Description Modifier [id number] - WC PHYS SUBQ TISS 20 SQ CM ICD-10 Diagnosis Description DAVARION, SAFARIAN ([id number]) [id number] Page [phone] Non-pressure chronic ulcer of other part of left lower leg with other  specified severity Quantity: 1 11 of 11 Electronic Signature(s) Signed: 11/27/2022 5:49:54 PM By: Worthy Keeler PA-C Entered By: Worthy Keeler on 11/27/2022 17:49:54

## 2022-11-28 NOTE — Progress Notes (Signed)
[name], [name] ([id number]) [id number] Page 1 of 8 Visit Report for 11/27/2022 Arrival Information Details Patient Name: Date of Service: [name] [name]. 11/27/2022 3:00 PM Medical Record Number: [id number] Patient Account Number: [id number] Date of Birth/Sex: Treating RN: 11/28/1965 (57 y.o. [name]) Dolores Lory, Morey Hummingbird Primary Care Trenesha Alcaide: [name] Other Clinician: Referring Meghin Thivierge: Treating Alissah Redmon/Extender: [name]3 Visit Information History Since Last Visit Added or deleted any medications: No Patient Arrived: Wheel Chair Any new allergies or adverse reactions: No Arrival Time: 15:12 Had a fall or experienced change in No Accompanied By: wife activities of daily living that may affect Transfer Assistance: None risk of falls: Patient Identification Verified: Yes Signs or symptoms of abuse/neglect since last visito No Secondary Verification Process Completed: Yes Hospitalized since last visit: No Patient Requires Transmission-Based Precautions: No Implantable device outside of the clinic excluding No Patient Has Alerts: No cellular tissue based products placed in the center since last visit: Has Dressing in Place as Prescribed: Yes Pain Present Now: No Electronic Signature(s) Signed: 11/28/2022 12:08:06 PM By: [name] RN Entered By: [name] on 11/27/2022 15:16:07 -------------------------------------------------------------------------------- Clinic Level of Care Assessment Details Patient Name: Date of Service: [name] [name]. 11/27/2022 3:00 PM Medical Record Number: [id number] Patient Account Number: [id number] Date of Birth/Sex: Treating RN: January 18, 1966 (57 y.o. [name]) [name] Primary Care Asyia Hornung: [name] Other Clinician: Referring Fue Cervenka: Treating Joanette Silveria/Extender: [name]3 Clinic Level of Care Assessment Items TOOL 1 Quantity Score []$  -  0 Use when EandM and Procedure is performed on INITIAL visit ASSESSMENTS - Nursing Assessment / Reassessment []$  - 0 General Physical Exam (combine w/ comprehensive assessment (listed just below) when performed on new pt. evals) []$  - 0 Comprehensive Assessment (HX, ROS, Risk Assessments, Wounds Hx, etc.) [name], [name] ([id number]) [phone].pdf Page 2 of 8 ASSESSMENTS - Wound and Skin Assessment / Reassessment []$  - 0 Dermatologic / Skin Assessment (not related to wound area) ASSESSMENTS - Ostomy and/or Continence Assessment and Care []$  - 0 Incontinence Assessment and Management []$  - 0 Ostomy Care Assessment and Management (repouching, etc.) PROCESS - Coordination of Care []$  - 0 Simple Patient / Family Education for ongoing care []$  - 0 Complex (extensive) Patient / Family Education for ongoing care []$  - 0 Staff obtains Programmer, systems, Records, T Results / Process Orders est []$  - 0 Staff telephones HHA, Nursing Homes / Clarify orders / etc []$  - 0 Routine Transfer to another Facility (non-emergent condition) []$  - 0 Routine Hospital Admission (non-emergent condition) []$  - 0 New Admissions / Biomedical engineer / Ordering NPWT Apligraf, etc. , []$  - 0 Emergency Hospital Admission (emergent condition) PROCESS - Special Needs []$  - 0 Pediatric / Minor Patient Management []$  - 0 Isolation Patient Management []$  - 0 Hearing / Language / Visual special needs []$  - 0 Assessment of Community assistance (transportation, D/C planning, etc.) []$  - 0 Additional assistance / Altered mentation []$  - 0 Support Surface(s) Assessment (bed, cushion, seat, etc.) INTERVENTIONS - Miscellaneous []$  - 0 External ear exam []$  - 0 Patient Transfer (multiple staff / Civil Service fast streamer / Similar devices) []$  - 0 Simple Staple / Suture removal (25 or less) []$  - 0 Complex Staple / Suture removal (26 or more) []$  - 0 Hypo/Hyperglycemic Management (do not check if billed separately) []$  -  0 Ankle / Brachial Index (ABI) - do not check if billed separately Has the patient been seen at the hospital within  the last three years: Yes Total Score: 0 Level Of Care: ____ Electronic Signature(s) Signed: 11/28/2022 12:08:06 PM By: [name] RN Entered By: [name] on 11/27/2022 15:49:16 -------------------------------------------------------------------------------- Encounter Discharge Information Details Patient Name: Date of Service: [name], [name] [name]. 11/27/2022 3:00 PM Medical Record Number: [id number] Patient Account Number: [id number] Date of Birth/Sex: Treating RN: May 25, 1966 (57 y.o. [name] Primary Care Margarite Vessel: [name] Other Clinician: Referring Tyce Delcid: Treating Gery Sabedra/Extender: [name] in Treatment: [address] [name] ([id number].pdf Page 3 of 8 Encounter Discharge Information Items Post Procedure Vitals Discharge Condition: Stable Temperature (F): 98.5 Ambulatory Status: Wheelchair Pulse (bpm): 71 Discharge Destination: Home Respiratory Rate (breaths/min): 18 Transportation: Private Auto Blood Pressure (mmHg): 160/93 Accompanied By: wife Schedule Follow-up Appointment: Yes Clinical Summary of Care: Electronic Signature(s) Signed: 11/28/2022 12:08:06 PM By: [name] RN Entered By: [name] on 11/27/2022 15:50:35 -------------------------------------------------------------------------------- Lower Extremity Assessment Details Patient Name: Date of Service: [name] [name]. 11/27/2022 3:00 PM Medical Record Number: [id number] Patient Account Number: [id number] Date of Birth/Sex: Treating RN: January 26, 1966 (57 y.o. [name]) [name] Primary Care Viral Schramm: [name] Other Clinician: Referring Marylene Masek: Treating Kendyll Huettner/Extender: [name]3 Electronic Signature(s) Signed: 11/28/2022 12:08:06 PM By: [name] RN Entered By: [name]  on 11/27/2022 15:46:03 -------------------------------------------------------------------------------- Multi Wound Chart Details Patient Name: Date of Service: [name], [place] [name]. 11/27/2022 3:00 PM Medical Record Number: [id number] Patient Account Number: [id number] Date of Birth/Sex: Treating RN: [birth year] (57 y.o. [name]) [name] Primary Care Dago Jungwirth: [name] Other Clinician: Referring Saamir Armstrong: Treating Nonna Renninger/Extender: [name]3 Vital Signs Height(in): 70 Pulse(bpm): 71 Weight(lbs): 166 Blood Pressure(mmHg): 160/93 Body Mass Index(BMI): 23.8 Temperature(F): 98.5 Respiratory Rate(breaths/min): 18 [3:Photos:] Booz, Draiden [name] ([id number]) [3:Photos:] [N/A:N/A] Left Amputation Site - Below Knee N/A N/A Wound Location: Surgical Injury N/A N/A Wounding Event: Dehisced Wound N/A N/A Primary Etiology: Cataracts, Anemia, Sleep Apnea, N/A N/A Comorbid History: Arrhythmia, Congestive Heart Failure, Coronary Artery Disease, Hypertension, Myocardial Infarction, Type II Diabetes, Neuropathy, Seizure Disorder 07/19/2022 N/A N/A Date Acquired: 13 N/A N/A Weeks of Treatment: Open N/A N/A Wound Status: No N/A N/A Wound Recurrence: 0.2x0.5x0.7 N/A N/A Measurements [name] x W x D (cm) 0.079 N/A N/A A (cm) : rea 0.055 N/A N/A Volume (cm) : 99.40% N/A N/A % Reduction in Area: 98.60% N/A N/A % Reduction in Volume: Full Thickness Without Exposed N/A N/A Classification: Support Structures Medium N/A N/A Exudate Amount: Serosanguineous N/A N/A Exudate Type: red, brown N/A N/A Exudate Color: Medium (34-66%) N/A N/A Granulation Amount: Pink N/A N/A Granulation Quality: Medium (34-66%) N/A N/A Necrotic Amount: Fat Layer (Subcutaneous Tissue): Yes N/A N/A Exposed Structures: Fascia: No Tendon: No Muscle: No Joint: No Bone: No None N/A N/A Epithelialization: Treatment Notes Electronic Signature(s) Signed: 11/28/2022  12:08:06 PM By: [name] RN Entered By: [name] on 11/27/2022 15:46:07 -------------------------------------------------------------------------------- [place] Details Patient Name: Date of Service: [name], [name] [name]. 11/27/2022 3:00 PM Medical Record Number: [id number] Patient Account Number: [id number] Date of Birth/Sex: Treating RN: 1966/09/29 (57 y.o. [name] Primary Care Nichoel Digiulio: [name] Other Clinician: Referring Jamaia Brum: Treating Charley Lafrance/Extender: [name] [place], [place] [name] ([id number].pdf Page 5 of 8 Nursing Diagnoses: Knowledge deficit related to ulceration/compromised skin integrity Goals: Patient/caregiver will verbalize understanding of skin care regimen Date Initiated: 08/28/2022 Target Resolution Date: 11/30/2022 Goal Status: Active Ulcer/skin breakdown  will have a volume reduction of 30% by week 4 Date Initiated: 08/28/2022 Date Inactivated: 10/30/2022 Target Resolution Date: 10/17/2022 Goal Status: Met Ulcer/skin breakdown will have a volume reduction of 50% by week 8 Date Initiated: 08/28/2022 Date Inactivated: 11/20/2022 Target Resolution Date: 11/17/2022 Goal Status: Met Ulcer/skin breakdown will have a volume reduction of 80% by week 12 Date Initiated: 08/28/2022 Target Resolution Date: 12/18/2022 Goal Status: Active Ulcer/skin breakdown will heal within 14 weeks Date Initiated: 08/28/2022 Target Resolution Date: 01/16/2023 Goal Status: Active Interventions: Assess patient/caregiver ability to obtain necessary supplies Assess patient/caregiver ability to perform ulcer/skin care regimen upon admission and as needed Assess ulceration(s) every visit Notes: Electronic Signature(s) Signed: 11/28/2022 12:08:06 PM By: [name] RN Entered By: [name] on 11/27/2022  15:49:46 -------------------------------------------------------------------------------- Pain Assessment Details Patient Name: Date of Service: [name] [name]. 11/27/2022 3:00 PM Medical Record Number: [id number] Patient Account Number: [id number] Date of Birth/Sex: Treating RN: 21-Dec-1965 (57 y.o. [name] Primary Care Kaysin Brock: [name] Other Clinician: Referring Dennise Raabe: Treating Enrique Manganaro/Extender: [name]3 Active Problems Location of Pain Severity and Description of Pain Patient Has Paino No Site Locations [place], [place] [name] ([id number]) [phone].pdf Page 6 of 8 Pain Management and Medication Current Pain Management: Electronic Signature(s) Signed: 11/28/2022 12:08:06 PM By: [name] RN Entered By: [name] on 11/27/2022 15:16:32 -------------------------------------------------------------------------------- Patient/Caregiver Education Details Patient Name: Date of Service: [name], [name] [name]. 2/8/2024andnbsp3:00 PM Medical Record Number: [id number] Patient Account Number: [id number] Date of Birth/Gender: Treating RN: 07-18-1966 (57 y.o. [name] Primary Care Physician: [name] Other Clinician: Referring Physician: Treating Physician/Extender: [name]3 Education Assessment Education Provided To: Patient Education Topics Provided Wound/Skin Impairment: Methods: Explain/Verbal Responses: State content correctly [employer]) Signed: 11/28/2022 12:08:06 PM By: [name] RN Entered By: [name] on 11/27/2022 15:49:33 -------------------------------------------------------------------------------- Wound Assessment Details Patient Name: Date of Service: [name], [name] [name]. 11/27/2022 3:00 PM Medical Record Number: [id number] Patient Account Number: [id number] Date of Birth/Sex: Treating RN: July 01, 1966 (57 y.o. [name] Primary Care Alycea Segoviano: [name] Other Clinician: Referring Kemonie Cutillo: Treating Ephriam Turman/Extender: [name]3 Wound Status Wound Number: 3 Primary Dehisced Wound Etiology: MANNIE, LASO ([id number]) [phone].pdf Page 7 of 8 Etiology: Wound Location: Left Amputation Site - Below Knee Wound Open Wounding Event: Surgical Injury Status: Date Acquired: 07/19/2022 Comorbid Cataracts, Anemia, Sleep Apnea, Arrhythmia, Congestive Heart Weeks Of Treatment: 13 History: Failure, Coronary Artery Disease, Hypertension, Myocardial Clustered Wound: No Infarction, Type II Diabetes, Neuropathy, Seizure Disorder Photos Wound Measurements Length: (cm) 0.2 Width: (cm) 0.5 Depth: (cm) 0.7 Area: (cm) 0.079 Volume: (cm) 0.055 % Reduction in Area: 99.4% % Reduction in Volume: 98.6% Epithelialization: None Tunneling: No Undermining: No Wound Description Classification: Full Thickness Without Exposed Support Structures Exudate Amount: Medium Exudate Type: Serosanguineous Exudate Color: red, brown Foul Odor After Cleansing: No Slough/Fibrino Yes Wound Bed Granulation Amount: Medium (34-66%) Exposed Structure Granulation Quality: Pink Fascia Exposed: No Necrotic Amount: Medium (34-66%) Fat Layer (Subcutaneous Tissue) Exposed: Yes Necrotic Quality: Adherent Slough Tendon Exposed: No Muscle Exposed: No Joint Exposed: No Bone Exposed: No Treatment Notes Wound #3 (Amputation Site - Below Knee) Wound Laterality: Left Cleanser Byram Ancillary Kit - 15 Day Supply Discharge Instruction: Use supplies as instructed; Kit contains: (15) Saline Bullets; (15) 3x3 Gauze; 15 pr Gloves Wound Cleanser Discharge Instruction: Wash your hands with soap and water. Remove old dressing, discard into plastic bag  and place into trash. Cleanse the wound with Wound Cleanser prior to applying a clean dressing using gauze sponges, not tissues  or cotton balls. Do not scrub or use excessive force. Pat dry using gauze sponges, not tissue or cotton balls. Peri-Wound Care Topical Primary Dressing Prisma 4.34 (in) Discharge Instruction: Moisten w/normal saline or sterile water; Cover wound as directed. Do not remove from wound bed. Secondary Dressing Gauze Secured With Medipore T - 58M Medipore H Soft Cloth Surgical T ape ape, 2x2 (in/yd) Tubigrip Size D, 3x10 (in/yd) Discharge Instruction: double layer to thigh Compression Wrap Compression Stockings Add-Ons BRUIN, PARAGAS ([id number]) [phone].pdf Page 8 of 8 Electronic Signature(s) Signed: 11/28/2022 12:08:06 PM By: [name] RN Entered By: [name] on 11/27/2022 15:45:44 -------------------------------------------------------------------------------- Vitals Details Patient Name: Date of Service: [name], JA [name]. 11/27/2022 3:00 PM Medical Record Number: [id number] Patient Account Number: [id number] Date of Birth/Sex: Treating RN: [birth year] (57 y.o. [name]) [name] Primary Care Sherran Margolis: [name] Other Clinician: Referring Sheela Mcculley: Treating Coal Nearhood/Extender: [name]3 Vital Signs Time Taken: 15:16 Temperature (F): 98.5 Height (in): 70 Pulse (bpm): 71 Weight (lbs): 166 Respiratory Rate (breaths/min): 18 Body Mass Index (BMI): 23.8 Blood Pressure (mmHg): 160/93 Reference Range: 80 - 120 mg / dl Electronic Signature(s) Signed: 11/28/2022 12:08:06 PM By: [name] RN Entered By: [name] on 11/27/2022 15:16:25

## 2022-12-04 ENCOUNTER — Ambulatory Visit: Payer: Medicare Other | Admitting: Physician Assistant

## 2022-12-11 ENCOUNTER — Ambulatory Visit: Payer: Medicare Other | Admitting: Internal Medicine

## 2022-12-18 ENCOUNTER — Encounter: Payer: Medicare Other | Admitting: Physician Assistant

## 2022-12-18 DIAGNOSIS — E11621 Type 2 diabetes mellitus with foot ulcer: Secondary | ICD-10-CM | POA: Diagnosis not present

## 2022-12-20 NOTE — Progress Notes (Signed)
[name], [name] ([id number]) [id number] Page 1 of 9 Visit Report for 12/18/2022 Chief Complaint Document Details Patient Name: Date of Service: [name] MES L. 12/18/2022 3:00 PM Medical Record Number: [id number] Patient Account Number: [id number] Date of Birth/Sex: Treating RN: Oct 15, 1966 (57 y.o. 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 12/18/2022 3:00 PM Medical Record Number: [id number] Patient Account Number: [id number] Date of Birth/Sex: Treating RN: 05/07/1966 (57 y.o. [name] Primary Care Provider: [name] Other Clinician: Referring Provider: Treating Provider/Extender: [name]6 History of Present Illness HPI Description: 10/31/2021 patient presents today for evaluation here in our clinic. He is actually being evaluated for hyperbaric oxygen therapy only. He has been seen at [name] up to this [name] he is under the care of the vascular surgery specialty. Subsequently he is also under the care of the hyperbaric [name] there. With that being said that due to the fact that he is a dialysis patient he was actually recommended to be transferred to [name] for his treatments he actually lives in [name] which is where his dialysis is. It was a hardship for him to try to get from [name] especially on dialysis days all the way to [name] get into the facility for his treatment and  get back home he was literally spending all day long. He has had just 4 treatments there at [facility] thus far based on what I see in these have not been continued while he awaits approval here in our clinic. Subsequently I do have records for review that will be included in the HPI predetermination review and attached to this chart as well. I will not duplicate that here. 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 Nonetheless he continues to have exposed bone with marked soft tissue swelling compatible with osteomyelitis. This is due to the irregularity of the head of the metatarsal as well. Nonetheless along with having associated cellulitis he is also good to be seen by infectious disease and is currently on antibiotics for this as well. Again that will be detailed in the pretreatment review section which I will attach to this note as well. He does currently have a wound VAC in place and again the wound was not evaluated here in the clinic as that is being completely managed by home health and [name]. The patient does have a history of diabetes mellitus type 2, hypertension, coronary artery disease, and congestive heart failure. He also has cataracts of both eyes but no evidence of glaucoma on his most recent eye exam. 11/22/2021 we have been seeing this gentleman for hyperbaric oxygen therapy but have not actually evaluated his wound up to this [name] this was being [place], [place] L ([id number]) [id number] Page 2 of 9 managed by the wound care and vascular team I presumed at [name] at least that is what has been told to be previous. Nonetheless at this [name] I got a call from  Atlee Abide who is a Librarian, academic at the [name] vascular clinic with Dr. Durene [name] and subsequently they were wanting to know if I could take  over wound care for this patient. Apparently they do not have the ability for an office debridement which again I completely understand but nonetheless definitely is not an integral part of his healing along with the hyperbarics. Subsequently I discussed with the patient today that I do believe he would benefit going ahead and proceeding with evaluation here in the clinic for that reason I did go ahead and see him today to get things started. There is also been some confusion about the issues with his home health nurse and getting measurements to [name] for the wound VAC in order to continue his wound VAC therapy. With that being said after I see him today we will make a determination on what to do with wound VAC we can definitely send measurements to [name] but again the bigger question is whether this is the best way to go or not. 11/28/2021 upon evaluation today patient appears to be doing decently well in regard to his wound. He has been tolerating the dressing changes with the Dakin's moistened gauze dressing which I think is doing a good job. Fortunately I do not see any signs of active infection locally nor systemically at this [name]. In general I think that he is actually making excellent progress which is great news. 2/17; seen in conjunction with HBO today. 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 Much improved condition of the wound surface. On the lateral foot and eschared area that he has been using Betadine. T olerating HBO well 12/13/2021 upon evaluation today patient appears to be doing pretty well in regard to his foot ulcer. I am actually much more pleased currently that I been with the way the appearance looks today. The wound bed does show some signs of slough and biofilm on the surface of the wound this is minimal I Minna perform debridement the I will do so very carefully due to the fact that to be honest the patient had a significant amount of bleeding  in the past being on the Brilinta he had a difficult time clotting when I debrided him previously quite significantly. Fortunately I do not see any signs of active infection at this time which is great news. No fevers, chills, nausea, vomiting, or diarrhea. 12/20/2021 upon evaluation today patient actually appears to be showing some signs of excellent improvement here. I am very pleased with where things stand currently. There does not appear to be any evidence of active infection locally nor systemically which is great news and I do believe that with the hyperbaric oxygen therapy he is actually making excellent progress. 3/9; patient presents for follow-up. He continues to do HBO therapy without issues. He has completed 34 out of 40 sessions. He continues to use Dakin's wet- to-dry to the amputation site and Betadine to the lateral foot wound. he currently denies signs of infection. 01/03/2022 upon evaluation today patient appears to be doing better in regard to his distal foot ulcer. Unfortunately the lateral foot is a different story this area has softened up and is getting need to be debrided away today. I Georgina Peer do this with scissors and forceps and was able to clear that out that will be detailed below. Otherwise I feel like the Dakin's moistened gauze dressing is doing an awesome job for him and he is  making significantly improvements here. 01/10/2022 upon evaluation today patient continues to have some of the necrotic tissue loosen up on the lateral portion of his foot the main wound that we have been treating is actually doing quite well. Overall very pleased with where things stand in general. No fevers, chills, nausea, vomiting, or diarrhea. 01/17/2022 upon evaluation today patient appears to be doing somewhat poorly in regard to the lateral foot wound. The main wound that we initially were seeing him for looks to be doing great. This lateral foot has worsened and unfortunately it appears to  potentially have some signs of infection here. I do believe that we may need to see about getting started on some IV antibiotics he is in dialysis so they could potentially do this for him, Monday. Nonetheless I want to try to get that started ASAP. Again I would prefer him be initiated with IV vancomycin and cefepime just as an empiric broad-spectrum until we can get the culture results back at least. Subsequently also think that this should be done sooner rather than later due to the fact that his leg is more swollen and overall this seems to have progressed fairly rapidly since last week I do not want this to get worse. He voiced understanding. 01-24-2022 upon evaluation today patient appears to be doing well with regard to his main ulcer that we have been seeing him for. I am very pleased with where things stand currently. He still continues to have bleeding if we do any type of aggressive sharp debridement there for him having to be very careful using Dakin's solution along with debridement as I can here in the office. Overall the patient seems to be doing well. Unfortunately the lateral foot wound still has progressed a little bit further. We did get him on antibiotics starting this past Monday I think the drainage looks much better and overall I am seeing improvement but again this was already broken down to a certain degree I am hopeful by next week this will really start turnaround for [name]. We will also get a send him for an x-ray to evaluate for any signs of osteomyelitis on this lateral foot. 01-31-2022 upon evaluation today patient's wound on the medial left amputation site is actually doing excellent. I am very pleased with where things stand this is significantly smaller and we are headed in the right direction. In regard to the left lateral foot there is still some necrotic tissue to be cleared away although I see evidence of this starting to basically solidify as far as the edges are  concerned with how far it spread I do not see anything seeming to want to spread further. I believe this may be close to done as far as any evolving is concerned. 02-07-2022 upon evaluation today patient appears to be doing well with regard to the main amputation site. Unfortunately the secondary site on the left lateral foot is still giving him some trouble here. There is a lot of dried tissue and I do believe that along with the Dakin's we may want to use a contact layer in the base of the wound to try to prevent the bone from drying out because we are down to that [name] at this time. The patient voiced understanding at this [name] as well and is happy to do what ever it takes to try to get this better. With that being said I do believe that it is possible he may benefit from a wound VAC. Again  the question is the approval process for getting this done obviously we can see what we can do if we get things cleaned a little bit better but right now I think the best option is going to be for [name] to continue with the packing currently. 02-14-2022 upon evaluation today patient presents for follow-up concerning ongoing issues with his foot. He did have the MRI which does show that he has evidence of osteomyelitis of the digit at the fifth location of his foot. Subsequently this does extend down to the base of the digit and into the proximal portion of the metatarsal as well. With that being said the bone does not appear to be too severely broken down and a lot of the marrow signal is stated to be maintained which is good news. Nonetheless I do believe that he is unfortunately still in a spot where this is precarious and we need to make sure that we are treating this appropriately. He does seem to be doing well with IV antibiotics he also seems to be doing quite well when it comes to the overall appearance of the wound bed at this [name]. I am happy in that regard. 02-21-2022 upon evaluation today patient appears  to be doing well all things considered in regard to his wounds. I do feel like that he is making some progress here. The wound on the right distal foot where the amputation site is is actually healing nicely I feel like we can switch over to collagen at this [name] that should do well for him. In regard to the left lateral foot this is actually getting much cleaner he still has some area where there seems to be some pressure getting to the location I think that he would do well with a knee scooter at this [name]. I discussed that with him he does not seem prone to going that route at this [name] he understands it can definitely help. 02-28-2022 upon evaluation today patient appears to be doing well currently with regard to the wound on his foot. The main surgical wound is actually significantly improved and the lateral wound is doing better there is still bone exposed but nonetheless this does not appear to be significantly worse than what we have noted previous. In fact I think it looks better than it did last week. My goal is to keep the bone from drying out and subsequently for that reason we are going to go ahead and proceed with the EpiFix which I think is good to be a good option here for him. Over top of the EpiFix we will do a contact layer in order to hopefully keep things from drying out. This dressing will actually stay in place until he comes back. Patient did see vascular yesterday and states that they did not feel like anything was doing worse still it appears that if it were left up to them they think the ideal thing would be to proceed with an amputation. I explained to the patient that to be honest is not always avoidable but if we can try to avoid amputation that is what we are after. He voiced understanding and he does want to do what ever he can to try to prevent amputation for that reason we are proceeding with the epi cord today. This will be application #1. 0000000 upon evaluation  today patient appears to be making some progress here regard to the lateral foot. Fortunately there does not appear to be any signs of active  infection locally or systemically which is great news. No fevers, chills, nausea, vomiting, or diarrhea. I do think that he is appropriate for second application of EpiCord today. 03-14-2022 upon evaluation today patient's wound still has significant exposure of the bone at this [name]. He did get crutches he has been trying to use those the past couple of days am not sure that is going so well to be perfectly honest. We did discuss however offloading is important and we even discussed a total contact cast but to be perfectly honest right now and feels like he may need more debridement to clear away some of the bone which is not viable in the end of the foot of the fifth metatarsal but I am unable to really do that here in the office due to his bleeding. I do not know if potentially we could have him seen by podiatry potentially Dr. Merril Abbe to see if there is anything he can do to help in this regard. I am not even certain whether the majority of the [place], [place] ([id number]) [id number] Page 3 of 9 metatarsal is even viable to be honest. Either way I do think that he would benefit from surgical debridement or even possibly partial ray amputation in order for [name] to try to keep things moving in the right direction and get this closed. This was all discussed with the patient today. With that being said I know he wants to avoid a below-knee amputation which is what has previously been recommended. 03-21-2022 upon evaluation today patient appears to be doing well in regard to the medial wound at this is shown signs of excellent improvement. In regard to the lateral wound this is a bit dry compared to what I want to see him Regino Schultze not draining as much but I think that we need to see what we can do about trying to keep this from drying out  and keeping it a bit more moist. Patient voiced understanding. Overall I am very pleased otherwise with where we stand. 03-27-2022 upon evaluation today patient appears to be doing well currently in regard to his wound especially on the medial aspect this looks really good the lateral aspect is looking a little bit better to with some healthier appearance to the bone compared to last week to be honest. Fortunately I do not see any signs of active infection locally or systemically at this time which is great news. No fevers, chills, nausea, vomiting, or diarrhea. READMISSION 08/28/2022 This is a 57 year old man who is a diabetic on dialysis but not currently on any treatment for it. He has a known history of PAD. He was in this clinic for a protracted period of time this year for a left foot wound with underlying osteomyelitis for which she was treated with hyperbaric oxygen. As I understand things he left the clinic in June with wounds and an unhealed state possibly infected. 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 Unfortunately in September he required a revision of the BKA but he was ultimately admitted to [name] for a protracted period with bacteremia secondary to group A strep from 9/12-10/12. Marland Kitchen He is recently completed vancomycin he was taking at dialysis He has been left with a nonhealing surgical wound on his left BKA amputation site. He recently followed with vascular surgery who recommended Santyl and Vashe solution which they have been applying to the  wound every day. It is noted that he does have severe PAD in the right lower extremity that is status post transmetatarsal amputation he is going to follow him up in 3 months. He is also seen infectious disease at [name] who state that he had MRSA treated with cefazolin and vancomycin at hemodialysis. However he is listed as having an allergic reaction to cefazolin including lip  swelling therefore he completed vancomycin. He is not felt to require any additional antibiotics. 09-04-2022 upon evaluation today patient appears to be doing well currently in regard to his wound on the medial aspect of this is amputation site where he had a BKA. On the lateral aspect however he has an area of the incision line that is open that actually goes somewhat deep. We did probe about 4 cm down although I do not feel any bone directly. Fortunately there does not appear to be any signs of active infection locally or systemically at this time. There is some concern here in my estimation about the possibility of infection and I think that he may need some extension of the antibiotics I Georgina Peer probably put him on linezolid as he was previously on vancomycin. 09-16-2022 upon evaluation today patient appears to be doing well currently in regard to his wound. There is good to be some need for sharp debridement today but fortunately nothing too significant. Fortunately I do not see any signs of active infection locally nor systemically at this time which is great news. No fevers, chills, nausea, vomiting, or diarrhea. 09-23-2022 upon evaluation today patient appears to be doing well currently in regard to his wounds that were still in the cleaning up phase in regard to the primary wound where he dehisced. There is a lot of necrotic tissue that is can have to be cleaned out working on that at this [name]. 09-30-2022 upon evaluation today patient appears to be doing well currently in regard to his wounds. He is slowly making progress and getting the main open area on the medial aspect of his incision line in a much healthier state. In general I feel like that we are headed in the right direction. I do not see any signs of active infection at this time. 10-07-2022 upon evaluation today patient appears to be doing well currently in regard to his wound. He has been tolerating the dressing changes  without complication of the lateral portion of the wound actually is closed the more medial portion is doing much better the swelling overall is excellent. He has been using the Tubigrip as a compression on the end of the stump and that has done extremely well for him. 12/26; unfortunately the patient's wound on the left BKA stump has a deeper area from 6-9 o'clock. There is not been any drainage she is not complaining of any pain. He states that he has been up in his wheelchair with his stump dependent quite a bit. This might account for why the edema in the stump seems out of control. 10-23-2022 upon evaluation today patient appears to be doing well currently in regard to his wound. This is actually showing signs of improvement I do believe he would benefit from the possibility of a snap VAC. Again we will get a check into his insurance to see if we can get approval. If we are able to that would definitely benefit him I do believe. Subsequently I will get him to look into this when she gets back in the office come Monday. 10-30-2022 upon  evaluation today patient appears to be doing well in regard to his wound he still has some depth here but fortunately there does not appear to be any signs of active infection locally nor systemically which is great news. No fevers, chills, nausea, vomiting, or diarrhea. 1/25; patient with a wound on the left BKA stump. Looking at this superficially healthy granulation the small rim of epithelialization however from 6-9 o'clock still roughly 1.2 cm of depth. There is no palpable bone and no observable drainage. The patient and his wife. We have been using silver alginate. 11-20-2022 upon evaluation today patient appears to be doing well with regard to his wound. He is showing signs of improvement although I think at this [name] the alginate probably is not necessary I think switching to a collagen based dressing would be perfect for him. The patient voiced understanding  and is in agreement with the plan. 11-27-2022 upon evaluation today patient appears to be doing excellent in regard to his wound although the collagen is getting very dry and stuck into the wound bed. Fortunately I do not see any signs of infection locally nor systemically which is great news and overall I am pleased in that regard. No fevers, chills, nausea, vomiting, or diarrhea. 12-18-2022 upon evaluation today patient appears to be doing well currently in regard to his wound. Has been tolerating the dressing changes without complication. Fortunately this actually seems to be doing quite well and was questionable whether this was completely healed when I initially checked it out. Electronic Signature(s) Signed: 12/18/2022 3:21:38 PM By: Worthy Keeler PA-C Entered By: Worthy Keeler on 12/18/2022 [address] Joyice Faster ([id number]) [id number] Page 4 of 9 -------------------------------------------------------------------------------- Physical Exam Details Patient Name: Date of Service: [name] MES L. 12/18/2022 3:00 PM Medical Record Number: [id number] Patient Account Number: [id number] Date of Birth/Sex: Treating RN: 1965/11/21 (57 y.o. [name] Primary Care Provider: [name] Other Clinician: Referring Provider: Treating Provider/Extender: [name] in Treatment: 38 Constitutional Well-nourished and well-hydrated in no acute distress. Respiratory normal breathing without difficulty. Psychiatric this patient is able to make decisions and demonstrates good insight into disease process. Alert and Oriented x 3. pleasant and cooperative. Notes I did perform a light cleaning of the wound to clearway some of the callus and dry dressing material on the surface. He tolerated that without complication once I got all that removed the patient's wound was actually completely healed. Electronic Signature(s) Signed: 12/18/2022  3:22:00 PM By: Worthy Keeler PA-C Entered By: Worthy Keeler on 12/18/2022 15:22:00 -------------------------------------------------------------------------------- Physician Orders Details Patient Name: Date of Service: [name], [name] MES L. 12/18/2022 3:00 PM Medical Record Number: [id number] Patient Account Number: [id number] Date of Birth/Sex: Treating RN: [birth year] (57 y.o. [name]) [name] Primary Care Provider: [name] Other Clinician: Referring Provider: Treating Provider/Extender: [name] in Treatment: [phone] Verbal / Phone Orders: No Diagnosis Coding ICD-10 Coding Code Description [phone] Non-pressure chronic ulcer of other part of left lower leg with other specified severity Z89.512 Acquired absence of left leg below knee E11.51 Type 2 diabetes mellitus with diabetic peripheral angiopathy without gangrene L03.116 Cellulitis of left lower limb Discharge From [name] Discharge from [facility] for protection times 1 week QUADREE, ASBERRY ([id number]) [id number] Page 5 of 9 Wound Treatment Electronic Signature(s) Signed: 12/18/2022 3:27:45 PM By: [name] RN Signed: 12/18/2022 5:15:32 PM By: Worthy Keeler PA-C Entered By: [name] on  12/18/2022 15:27:44 -------------------------------------------------------------------------------- Problem List Details Patient Name: Date of Service: [name] MES L. 12/18/2022 3:00 PM Medical Record Number: [id number] Patient Account Number: [id number] Date of Birth/Sex: Treating RN: [birth year] (57 y.o. [name]) [name] Primary Care Provider: [name] Other Clinician: Referring Provider: Treating Provider/Extender: [name]6 Active Problems ICD-10 Encounter Code Description Active Date MDM Diagnosis [phone] Non-pressure chronic ulcer of other part of left lower leg with other specified  08/28/2022 No Yes severity Z89.512 Acquired absence of left leg below knee 08/28/2022 No Yes E11.51 Type 2 diabetes mellitus with diabetic peripheral angiopathy without gangrene 08/28/2022 No Yes L03.116 Cellulitis of left lower limb 08/28/2022 No Yes Inactive Problems Resolved Problems Electronic Signature(s) Signed: 12/18/2022 3:14:04 PM By: Worthy Keeler PA-C Entered By: Worthy Keeler on 12/18/2022 15:14:04 Estero, Joyice Faster ([id number]) [id number] Page 6 of 9 -------------------------------------------------------------------------------- Progress Note Details Patient Name: Date of Service: [name] M[facility] L. 12/18/2022 3:00 PM Medical Record Number: [id number] Patient Account Number: [id number] Date of Birth/Sex: Treating RN: November 11, 1965 (57 y.o. 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 He is actually being evaluated for hyperbaric oxygen therapy only. He has been seen at [name] up to this [name] he is under the care of the vascular surgery specialty. Subsequently he is also under the care of the hyperbaric [name] there. With that being said that due to the fact that he is a dialysis patient he was actually recommended to be transferred to [name] for his treatments he actually lives in [place] which is where his dialysis is. It was a hardship for him to try to get from [place] especially on dialysis days all the way to [name] get into the facility for his treatment and get back home he was literally spending all day long. He has had just 4 treatments there at [name] thus far based on what I  see in these have not been continued while he awaits approval here in our clinic. Subsequently I do have records for review that will be included in the HPI predetermination review and attached to this chart as well. I will not duplicate that here. 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 Nonetheless he continues to have exposed bone with marked soft tissue swelling compatible with osteomyelitis. This is due to the irregularity of the head of the metatarsal as well. Nonetheless along with having associated cellulitis he is also good to be seen by infectious disease and is currently on antibiotics for this as well. Again that will be detailed in the pretreatment review section which I will attach to this note as well. He does currently have a wound VAC in place and again the wound was not evaluated here in the clinic as that is being completely managed by home health and [facility]. The patient does have a history of diabetes mellitus type 2, hypertension, coronary artery disease, and congestive heart failure. He also has cataracts of both eyes but no evidence of glaucoma on his most recent eye exam.  11/22/2021 we have been seeing this gentleman for hyperbaric oxygen therapy but have not actually evaluated his wound up to this [name] this was being managed by the wound care and vascular team I presumed at [facility] at least that is what has been told to be previous. Nonetheless at this [name] I got a call from Atlee Abide who is a Librarian, academic at the [name] vascular clinic with Dr. Durene [name] and subsequently they were wanting to know if I could take over wound care for this patient. Apparently they do not have the ability for an office debridement which again I completely understand but nonetheless definitely is not an integral part of his healing  along with the hyperbarics. Subsequently I discussed with the patient today that I do believe he would benefit going ahead and proceeding with evaluation here in the clinic for that reason I did go ahead and see him today to get things started. There is also been some confusion about the issues with his home health nurse and getting measurements to [name] for the wound VAC in order to continue his wound VAC therapy. With that being said after I see him today we will make a determination on what to do with wound VAC we can definitely send measurements to [name] but again the bigger question is whether this is the best way to go or not. 11/28/2021 upon evaluation today patient appears to be doing decently well in regard to his wound. He has been tolerating the dressing changes with the Dakin's moistened gauze dressing which I think is doing a good job. Fortunately I do not see any signs of active infection locally nor systemically at this [name]. In general I think that he is actually making excellent progress which is great news. 2/17; seen in conjunction with HBO today. 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 Much improved condition of the wound surface. On the lateral foot and eschared area that he has been using Betadine. T olerating HBO well 12/13/2021 upon evaluation today patient appears to be doing pretty well in regard to his foot ulcer. I am actually much more pleased currently that I been with the way the appearance looks today. The wound bed does show some signs of slough and biofilm on the surface of the wound this is minimal I Minna perform debridement the I will do so very carefully due to the fact that to be honest the patient had a significant amount of bleeding in the past being on the Brilinta he had a difficult time clotting when I debrided him previously quite significantly. Fortunately I do not see any signs of active infection at this time which is  great news. No fevers, chills, nausea, vomiting, or diarrhea. 12/20/2021 upon evaluation today patient actually appears to be showing some signs of excellent improvement here. I am very pleased with where things stand currently. There does not appear to be any evidence of active infection locally nor systemically which is great news and I do believe that with the hyperbaric oxygen therapy he is actually making excellent progress. 3/9; patient presents for follow-up. He continues to do HBO therapy without issues. He has completed 34 out of 40 sessions. He continues to use Dakin's wet- to-dry to the amputation site and Betadine to the lateral foot wound. he currently denies signs of infection. 01/03/2022 upon evaluation today patient appears to be doing better in regard to his distal foot ulcer. Unfortunately the lateral foot  is a different story this area has softened up and is getting need to be debrided away today. I Georgina Peer do this with scissors and forceps and was able to clear that out that will be detailed below. Otherwise I feel like the Dakin's moistened gauze dressing is doing an awesome job for him and he is making significantly improvements here. 01/10/2022 upon evaluation today patient continues to have some of the necrotic tissue loosen up on the lateral portion of his foot the main wound that we have been treating is actually doing quite well. Overall very pleased with where things stand in general. No fevers, chills, nausea, vomiting, or diarrhea. 01/17/2022 upon evaluation today patient appears to be doing somewhat poorly in regard to the lateral foot wound. The main wound that we initially were seeing him for looks to be doing great. This lateral foot has worsened and unfortunately it appears to potentially have some signs of infection here. I do believe that we may need to see about getting started on some IV antibiotics he is in dialysis so they could potentially do this for him, Monday.  Nonetheless I want to try to get that started ASAP. Again I would prefer him be initiated with IV vancomycin and cefepime just as an empiric broad-spectrum until we can get the culture results back at least. Subsequently also think that this should be done sooner rather than later due to the fact that his leg is more swollen and overall this seems to have progressed fairly rapidly since last week I do not want this to get worse. He voiced understanding. RIESE, BOGART ([id number]) [id number] Page 7 of 9 01-24-2022 upon evaluation today patient appears to be doing well with regard to his main ulcer that we have been seeing him for. I am very pleased with where things stand currently. He still continues to have bleeding if we do any type of aggressive sharp debridement there for him having to be very careful using Dakin's solution along with debridement as I can here in the office. Overall the patient seems to be doing well. Unfortunately the lateral foot wound still has progressed a little bit further. We did get him on antibiotics starting this past Monday I think the drainage looks much better and overall I am seeing improvement but again this was already broken down to a certain degree I am hopeful by next week this will really start turnaround for [name]. We will also get a send him for an x-ray to evaluate for any signs of osteomyelitis on this lateral foot. 01-31-2022 upon evaluation today patient's wound on the medial left amputation site is actually doing excellent. I am very pleased with where things stand this is significantly smaller and we are headed in the right direction. In regard to the left lateral foot there is still some necrotic tissue to be cleared away although I see evidence of this starting to basically solidify as far as the edges are concerned with how far it spread I do not see anything seeming to want to spread further. I believe this may be close to done  as far as any evolving is concerned. 02-07-2022 upon evaluation today patient appears to be doing well with regard to the main amputation site. Unfortunately the secondary site on the left lateral foot is still giving him some trouble here. There is a lot of dried tissue and I do believe that along with the Dakin's we may want to use a contact layer in the  base of the wound to try to prevent the bone from drying out because we are down to that [name] at this time. The patient voiced understanding at this [name] as well and is happy to do what ever it takes to try to get this better. With that being said I do believe that it is possible he may benefit from a wound VAC. Again the question is the approval process for getting this done obviously we can see what we can do if we get things cleaned a little bit better but right now I think the best option is going to be for [name] to continue with the packing currently. 02-14-2022 upon evaluation today patient presents for follow-up concerning ongoing issues with his foot. He did have the MRI which does show that he has evidence of osteomyelitis of the digit at the fifth location of his foot. Subsequently this does extend down to the base of the digit and into the proximal portion of the metatarsal as well. With that being said the bone does not appear to be too severely broken down and a lot of the marrow signal is stated to be maintained which is good news. Nonetheless I do believe that he is unfortunately still in a spot where this is precarious and we need to make sure that we are treating this appropriately. He does seem to be doing well with IV antibiotics he also seems to be doing quite well when it comes to the overall appearance of the wound bed at this [name]. I am happy in that regard. 02-21-2022 upon evaluation today patient appears to be doing well all things considered in regard to his wounds. I do feel like that he is making some progress here. The wound  on the right distal foot where the amputation site is is actually healing nicely I feel like we can switch over to collagen at this [name] that should do well for him. In regard to the left lateral foot this is actually getting much cleaner he still has some area where there seems to be some pressure getting to the location I think that he would do well with a knee scooter at this [name]. I discussed that with him he does not seem prone to going that route at this [name] he understands it can definitely help. 02-28-2022 upon evaluation today patient appears to be doing well currently with regard to the wound on his foot. The main surgical wound is actually significantly improved and the lateral wound is doing better there is still bone exposed but nonetheless this does not appear to be significantly worse than what we have noted previous. In fact I think it looks better than it did last week. My goal is to keep the bone from drying out and subsequently for that reason we are going to go ahead and proceed with the EpiFix which I think is good to be a good option here for him. Over top of the EpiFix we will do a contact layer in order to hopefully keep things from drying out. This dressing will actually stay in place until he comes back. Patient did see vascular yesterday and states that they did not feel like anything was doing worse still it appears that if it were left up to them they think the ideal thing would be to proceed with an amputation. I explained to the patient that to be honest is not always avoidable but if we can try to avoid amputation that is what  we are after. He voiced understanding and he does want to do what ever he can to try to prevent amputation for that reason we are proceeding with the epi cord today. This will be application #1. 0000000 upon evaluation today patient appears to be making some progress here regard to the lateral foot. Fortunately there does not appear to be any  signs of active infection locally or systemically which is great news. No fevers, chills, nausea, vomiting, or diarrhea. I do think that he is appropriate for second application of EpiCord today. 03-14-2022 upon evaluation today patient's wound still has significant exposure of the bone at this [name]. He did get crutches he has been trying to use those the past couple of days am not sure that is going so well to be perfectly honest. We did discuss however offloading is important and we even discussed a total contact cast but to be perfectly honest right now and feels like he may need more debridement to clear away some of the bone which is not viable in the end of the foot of the fifth metatarsal but I am unable to really do that here in the office due to his bleeding. I do not know if potentially we could have him seen by podiatry potentially Dr. Merril Abbe to see if there is anything he can do to help in this regard. I am not even certain whether the majority of the metatarsal is even viable to be honest. Either way I do think that he would benefit from surgical debridement or even possibly partial ray amputation in order for [name] to try to keep things moving in the right direction and get this closed. This was all discussed with the patient today. With that being said I know he wants to avoid a below-knee amputation which is what has previously been recommended. 03-21-2022 upon evaluation today patient appears to be doing well in regard to the medial wound at this is shown signs of excellent improvement. In regard to the lateral wound this is a bit dry compared to what I want to see him Regino Schultze not draining as much but I think that we need to see what we can do about trying to keep this from drying out and keeping it a bit more moist. Patient voiced understanding. Overall I am very pleased otherwise with where we stand. 03-27-2022 upon evaluation today patient appears to be doing well currently in regard  to his wound especially on the medial aspect this looks really good the lateral aspect is looking a little bit better to with some healthier appearance to the bone compared to last week to be honest. Fortunately I do not see any signs of active infection locally or systemically at this time which is great news. No fevers, chills, nausea, vomiting, or diarrhea. READMISSION 08/28/2022 This is a 57 year old man who is a diabetic on dialysis but not currently on any treatment for it. He has a known history of PAD. He was in this clinic for a protracted period of time this year for a left foot wound with underlying osteomyelitis for which she was treated with hyperbaric oxygen. As I understand things he left the clinic in June with wounds and an unhealed state possibly infected. 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 Unfortunately in September he required a revision of the BKA but he was ultimately admitted to [name] for  a protracted period with bacteremia secondary to group A strep from 9/12-10/12. Marland Kitchen He is recently completed vancomycin he was taking at dialysis He has been left with a nonhealing surgical wound on his left BKA amputation site. He recently followed with vascular surgery who recommended Santyl and Vashe solution which they have been applying to the wound every day. It is noted that he does have severe PAD in the right lower extremity that is status post transmetatarsal amputation he is going to follow him up in 3 months. He is also seen infectious disease at [facility] who state that he had MRSA treated with cefazolin and vancomycin at hemodialysis. However he is listed as having an allergic reaction to cefazolin including lip swelling therefore he completed vancomycin. He is not felt to require any additional antibiotics. 09-04-2022 upon evaluation today patient appears to be doing well currently in regard to his wound on the  medial aspect of this is amputation site where he had a BKA. On the lateral aspect however he has an area of the incision line that is open that actually goes somewhat deep. We did probe about 4 cm down although I do not feel any bone directly. Fortunately there does not appear to be any signs of active infection locally or systemically at this time. There is some concern here in my estimation about the possibility of infection and I think that he may need some extension of the antibiotics I Georgina Peer probably put him on linezolid as he was previously on vancomycin. 09-16-2022 upon evaluation today patient appears to be doing well currently in regard to his wound. There is good to be some need for sharp debridement today but fortunately nothing too significant. Fortunately I do not see any signs of active infection locally nor systemically at this time which is great news. No fevers, chills, nausea, vomiting, or diarrhea. 09-23-2022 upon evaluation today patient appears to be doing well currently in regard to his wounds that were still in the cleaning up phase in regard to the primary wound where he dehisced. There is a lot of necrotic tissue that is can have to be cleaned out working on that at this [name]. 09-30-2022 upon evaluation today patient appears to be doing well currently in regard to his wounds. He is slowly making progress and getting the main open JAYIN, CHESEBRO ([id number]) [id number] Page 8 of 9 area on the medial aspect of his incision line in a much healthier state. In general I feel like that we are headed in the right direction. I do not see any signs of active infection at this time. 10-07-2022 upon evaluation today patient appears to be doing well currently in regard to his wound. He has been tolerating the dressing changes without complication of the lateral portion of the wound actually is closed the more medial portion is doing much better the swelling  overall is excellent. He has been using the Tubigrip as a compression on the end of the stump and that has done extremely well for him. 12/26; unfortunately the patient's wound on the left BKA stump has a deeper area from 6-9 o'clock. There is not been any drainage she is not complaining of any pain. He states that he has been up in his wheelchair with his stump dependent quite a bit. This might account for why the edema in the stump seems out of control. 10-23-2022 upon evaluation today patient appears to be doing well currently in regard to his wound. This is actually  showing signs of improvement I do believe he would benefit from the possibility of a snap VAC. Again we will get a check into his insurance to see if we can get approval. If we are able to that would definitely benefit him I do believe. Subsequently I will get him to look into this when she gets back in the office come Monday. 10-30-2022 upon evaluation today patient appears to be doing well in regard to his wound he still has some depth here but fortunately there does not appear to be any signs of active infection locally nor systemically which is great news. No fevers, chills, nausea, vomiting, or diarrhea. 1/25; patient with a wound on the left BKA stump. Looking at this superficially healthy granulation the small rim of epithelialization however from 6-9 o'clock still roughly 1.2 cm of depth. There is no palpable bone and no observable drainage. The patient and his wife. We have been using silver alginate. 11-20-2022 upon evaluation today patient appears to be doing well with regard to his wound. He is showing signs of improvement although I think at this [name] the alginate probably is not necessary I think switching to a collagen based dressing would be perfect for him. The patient voiced understanding and is in agreement with the plan. 11-27-2022 upon evaluation today patient appears to be doing excellent in regard to his wound although  the collagen is getting very dry and stuck into the wound bed. Fortunately I do not see any signs of infection locally nor systemically which is great news and overall I am pleased in that regard. No fevers, chills, nausea, vomiting, or diarrhea. 12-18-2022 upon evaluation today patient appears to be doing well currently in regard to his wound. Has been tolerating the dressing changes without complication. Fortunately this actually seems to be doing quite well and was questionable whether this was completely healed when I initially checked it out. Objective Constitutional Well-nourished and well-hydrated in no acute distress. Vitals Time Taken: 3:03 PM, Height: 70 in, Weight: 166 lbs, BMI: 23.8, Temperature: 98.2 F, Pulse: 70 bpm, Respiratory Rate: 18 breaths/min, Blood Pressure: 142/90 mmHg. Respiratory normal breathing without difficulty. Psychiatric this patient is able to make decisions and demonstrates good insight into disease process. Alert and Oriented x 3. pleasant and cooperative. General Notes: I did perform a light cleaning of the wound to clearway some of the callus and dry dressing material on the surface. He tolerated that without complication once I got all that removed the patient's wound was actually completely healed. Integumentary (Hair, Skin) Wound #3 status is Open. Original cause of wound was Surgical Injury. The date acquired was: 07/19/2022. The wound has been in treatment 16 weeks. 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 The wound measures 0cm length x 0cm width x 0cm depth; 0cm^2 area and 0cm^3 volume. There is no tunneling or undermining noted. There is a none present amount of drainage noted. There is no granulation within the wound bed. There is no necrotic tissue within the wound bed. 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 Based on what I am seeing I do think I would recommend that he continue to protect it for the next week to 2 weeks just to allow the skin to toughen up. He is  in agreement with that plan. GERRELL, SENNA ([id number]) [id number] Page 9 of 9 2. I am also can recommend the patient should go ahead forward with the evaluation with the prosthesis clinic I think next Thursday which she is already scheduled for should be good and they should be able to do what ever they need to get things moving. 3. He should also continue to monitor for any signs of worsening if anything changes he knows contact the office and let [name] know hopefully should have no issues. We will see patient back for reevaluation in 1 week here in the clinic. If anything worsens or changes patient will contact our office for additional recommendations. Electronic Signature(s) Signed: 12/18/2022 3:22:35 PM By: Worthy Keeler PA-C Entered By: Worthy Keeler on 12/18/2022 15:22:34 -------------------------------------------------------------------------------- SuperBill Details Patient Name: Date of Service: [name], [name] MES L. 12/18/2022 Medical Record Number: [id number] Patient Account Number: [id number] Date of Birth/Sex: Treating RN: [birth year] (57 y.o. [name]) [name] Primary Care Provider: [name] Other Clinician: Referring Provider: Treating Provider/Extender: [name]6 Diagnosis Coding ICD-10 Codes Code Description [phone] Non-pressure chronic ulcer of other part of left lower leg with other specified severity Z89.512 Acquired absence of left leg below knee E11.51 Type 2 diabetes mellitus with diabetic peripheral angiopathy without gangrene L03.116 Cellulitis of left lower limb Facility Procedures : CPT4 Code:  [id number] Description: [id number] - WOUND CARE VISIT-LEV 2 EST PT Modifier: Quantity: 1 Physician Procedures : CPT4 Code Description Modifier [id number] - WC PHYS LEVEL 3 - EST PT ICD-10 Diagnosis Description L97.828 Non-pressure chronic ulcer of other part of left lower leg with other specified severity Z89.512 Acquired absence of left leg below knee E11.51  Type 2 diabetes mellitus with diabetic peripheral angiopathy without gangrene L03.116 Cellulitis of left lower limb Quantity: 1 Electronic Signature(s) Signed: 12/18/2022 3:28:33 PM By: [name] RN Signed: 12/18/2022 5:15:32 PM By: Worthy Keeler PA-C Previous Signature: 12/18/2022 3:23:00 PM Version By: Worthy Keeler PA-C Entered By: [name] on 12/18/2022 15:28:33

## 2022-12-21 NOTE — Progress Notes (Signed)
[name], [name] ([id number]) [id number] Page 1 of 8 Visit Report for 12/18/2022 Arrival Information Details Patient Name: Date of Service: [name] [name]. 12/18/2022 3:00 PM Medical Record Number: [id number] Patient Account Number: [id number] Date of Birth/Sex: Treating RN: September 07, 1966 (57 y.o. [name]) [name], [name] Primary Care [name]: [name] Other Clinician: Referring [name]: Treating [name]/Extender: [name] in Treatment: 16 Visit Information History Since Last Visit Added or deleted any medications: No Patient Arrived: Wheel Chair Any new allergies or adverse reactions: No Arrival Time: 15:02 Had a fall or experienced change in No Accompanied By: caregiver activities of daily living that may affect Transfer Assistance: None risk of falls: Patient Identification Verified: Yes Signs or symptoms of abuse/neglect since last visito No Secondary Verification Process Completed: Yes Hospitalized since last visit: No Patient Requires Transmission-Based Precautions: No Implantable device outside of the clinic excluding No Patient Has Alerts: No cellular tissue based products placed in the center since last visit: Has Dressing in Place as Prescribed: Yes Has Compression in Place as Prescribed: Yes Pain Present Now: No Electronic Signature(s) Signed: 12/19/2022 8:38:45 AM By: [name] Coria RN Entered By: [name] on 12/18/2022 15:03:43 -------------------------------------------------------------------------------- Clinic Level of Care Assessment Details Patient Name: Date of Service: [name] [name]. 12/18/2022 3:00 PM Medical Record Number: [id number] Patient Account Number: [id number] Date of Birth/Sex: Treating RN: Feb 27, 1966 (57 y.o. [name]) [name] Primary Care [name]: [name] Other Clinician: Referring [name]: Treating [name]/Extender: [name] in Treatment: 16 Clinic Level of  Care Assessment Items TOOL 4 Quantity Score X- 1 0 Use when only an EandM is performed on FOLLOW-UP visit ASSESSMENTS - Nursing Assessment / Reassessment X- 1 10 Reassessment of Co-morbidities (includes updates in patient status) X- 1 5 Reassessment of Adherence to Treatment Plan [name], [name] ([id number]) [id number] Page 2 of 8 ASSESSMENTS - Wound and Skin A ssessment / Reassessment X - Simple Wound Assessment / Reassessment - one wound 1 5 '[]'$  - 0 Complex Wound Assessment / Reassessment - multiple wounds '[]'$  - 0 Dermatologic / Skin Assessment (not related to wound area) ASSESSMENTS - Focused Assessment '[]'$  - 0 Circumferential Edema Measurements - multi extremities '[]'$  - 0 Nutritional Assessment / Counseling / Intervention '[]'$  - 0 Lower Extremity Assessment (monofilament, tuning fork, pulses) '[]'$  - 0 Peripheral Arterial Disease Assessment (using hand held doppler) ASSESSMENTS - Ostomy and/or Continence Assessment and Care '[]'$  - 0 Incontinence Assessment and Management '[]'$  - 0 Ostomy Care Assessment and Management (repouching, etc.) PROCESS - Coordination of Care X - Simple Patient / Family Education for ongoing care 1 15 '[]'$  - 0 Complex (extensive) Patient / Family Education for ongoing care '[]'$  - 0 Staff obtains Programmer, systems, Records, T Results / Process Orders est '[]'$  - 0 Staff telephones HHA, Nursing Homes / Clarify orders / etc '[]'$  - 0 Routine Transfer to another Facility (non-emergent condition) '[]'$  - 0 Routine Hospital Admission (non-emergent condition) '[]'$  - 0 New Admissions / Biomedical engineer / Ordering NPWT Apligraf, etc. , '[]'$  - 0 Emergency Hospital Admission (emergent condition) X- 1 10 Simple Discharge Coordination '[]'$  - 0 Complex (extensive) Discharge Coordination PROCESS - Special Needs '[]'$  - 0 Pediatric / Minor Patient Management '[]'$  - 0 Isolation Patient Management '[]'$  - 0 Hearing / Language / Visual special needs '[]'$  -  0 Assessment of Community assistance (transportation, D/C planning, etc.) '[]'$  - 0 Additional assistance / Altered mentation '[]'$  - 0 Support Surface(s) Assessment (bed, cushion, seat, etc.) INTERVENTIONS -  Wound Cleansing / Measurement X - Simple Wound Cleansing - one wound 1 5 '[]'$  - 0 Complex Wound Cleansing - multiple wounds X- 1 5 Wound Imaging (photographs - any number of wounds) '[]'$  - 0 Wound Tracing (instead of photographs) X- 1 5 Simple Wound Measurement - one wound '[]'$  - 0 Complex Wound Measurement - multiple wounds INTERVENTIONS - Wound Dressings '[]'$  - 0 Small Wound Dressing one or multiple wounds '[]'$  - 0 Medium Wound Dressing one or multiple wounds '[]'$  - 0 Large Wound Dressing one or multiple wounds '[]'$  - 0 Application of Medications - topical '[]'$  - 0 Application of Medications - injection INTERVENTIONS - Miscellaneous '[]'$  - 0 External ear exam [name], [name] ([id number]) [id number] Page 3 of 8 '[]'$  - 0 Specimen Collection (cultures, biopsies, blood, body fluids, etc.) '[]'$  - 0 Specimen(s) / Culture(s) sent or taken to Lab for analysis '[]'$  - 0 Patient Transfer (multiple staff / [name] Lift / Similar devices) '[]'$  - 0 Simple Staple / Suture removal (25 or less) '[]'$  - 0 Complex Staple / Suture removal (26 or more) '[]'$  - 0 Hypo / Hyperglycemic Management (close monitor of Blood Glucose) '[]'$  - 0 Ankle / Brachial Index (ABI) - do not check if billed separately X- 1 5 Vital Signs Has the patient been seen at the hospital within the last three years: Yes Total Score: 65 Level Of Care: New/Established - Level 2 Electronic Signature(s) Signed: 12/19/2022 8:38:45 AM By: [name] Coria RN Entered By: [name] on 12/18/2022 15:28:21 -------------------------------------------------------------------------------- Encounter Discharge Information Details Patient Name: Date of Service: [name], [name] [name]. 12/18/2022 3:00 PM Medical Record Number:  [id number] Patient Account Number: [id number] Date of Birth/Sex: Treating RN: [birth year] (57 y.o. [name]) [name] Primary Care Ethelyn Cerniglia: [name] Other Clinician: Referring Keighley Deckman: Treating Xayne Brumbaugh/Extender: [name] in Treatment: 16 Encounter Discharge Information Items Discharge Condition: Stable Ambulatory Status: Wheelchair Discharge Destination: Home Transportation: Private Auto Accompanied By: self Schedule Follow-up Appointment: Yes Clinical Summary of Care: Electronic Signature(s) Signed: 12/18/2022 3:29:33 PM By: [name] Coria RN Entered By: [name] on 12/18/2022 15:29:33 Lower Extremity Assessment Details -------------------------------------------------------------------------------- [name] ([id number]) [id number] Page 4 of 8 Patient Name: Date of Service: [name] [name]. 12/18/2022 3:00 PM Medical Record Number: [id number] Patient Account Number: [id number] Date of Birth/Sex: Treating RN: 1965-11-09 (57 y.o. [name]) [name] Primary Care Dariela Stoker: [name] Other Clinician: Referring Halim Surrette: Treating Yenifer Saccente/Extender: [name] in Treatment: 16 Electronic Signature(s) Signed: 12/18/2022 3:27:02 PM By: [name] Coria RN Entered By: [name] on 12/18/2022 15:27:01 -------------------------------------------------------------------------------- Multi Wound Chart Details Patient Name: Date of Service: [name], [name] [name]. 12/18/2022 3:00 PM Medical Record Number: [id number] Patient Account Number: [id number] Date of Birth/Sex: Treating RN: 1966-03-20 (57 y.o. [name]) [name] Primary Care Summar Mcglothlin: [name] Other Clinician: Referring Aundrey Elahi: Treating Marabella Popiel/Extender: [name] in Treatment: 16 Vital Signs Height(in): 70 Pulse(bpm): 70 Weight(lbs): 166 Blood Pressure(mmHg): 142/90 Body Mass Index(BMI): 23.8 Temperature(F):  98.2 Respiratory Rate(breaths/min): 18 [3:Photos:] [N/A:N/A] Left Amputation Site - Below Knee N/A N/A Wound Location: Surgical Injury N/A N/A Wounding Event: Dehisced Wound N/A N/A Primary Etiology: Cataracts, Anemia, Sleep Apnea, N/A N/A Comorbid History: Arrhythmia, Congestive Heart Failure, Coronary Artery Disease, Hypertension, Myocardial Infarction, Type II Diabetes, Neuropathy, Seizure Disorder 07/19/2022 N/A N/A Date Acquired: 16 N/A N/A Weeks of Treatment: Open N/A N/A Wound Status: No N/A N/A Wound Recurrence: 0x0x0 N/A N/A Measurements [name] x W x D (cm) 0  N/A N/A A (cm) : rea 0 N/A N/A Volume (cm) : 100.00% N/A N/A % Reduction in Area: 100.00% N/A N/A % Reduction in Volume: Full Thickness Without Exposed N/A N/A Classification: Support Structures None Present N/A N/A Exudate Amount: None Present (0%) N/A N/A Granulation Amount: None Present (0%) N/A N/A Necrotic Amount: Fascia: No N/A N/A Exposed Structures: Fat Layer (Subcutaneous Tissue): No [name], [name] ([id number]) [id number] Page 5 of 8 Tendon: No Muscle: No Joint: No Bone: No Large (67-100%) N/A N/A Epithelialization: Treatment Notes Electronic Signature(s) Signed: 12/18/2022 3:27:06 PM By: [name] Coria RN Entered By: [name] on 12/18/2022 15:27:06 -------------------------------------------------------------------------------- [place] Details Patient Name: Date of Service: [name], [name] [name]. 12/18/2022 3:00 PM Medical Record Number: [id number] Patient Account Number: [id number] Date of Birth/Sex: Treating RN: 04-21-1966 (57 y.o. [name] Primary Care Nayda Riesen: [name] Other Clinician: Referring Kenlee Maler: Treating Forrest Jaroszewski/Extender: [name] in Treatment: 16 Active Inactive Electronic Signature(s) Signed: 12/18/2022 3:28:47 PM By: [name] Coria RN Entered By: [name] on 12/18/2022  15:28:47 -------------------------------------------------------------------------------- Pain Assessment Details Patient Name: Date of Service: [name], [name] [name]. 12/18/2022 3:00 PM Medical Record Number: [id number] Patient Account Number: [id number] Date of Birth/Sex: Treating RN: 1966-05-10 (57 y.o. [name] Primary Care Dangela How: [name] Other Clinician: Referring Mamta Rimmer: Treating Teara Duerksen/Extender: [name] in Treatment: 16 Active Problems Location of Pain Severity and Description of Pain Patient Has Paino No Site Locations [place], [place] [name] ([id number]) [phone].pdf Page 6 of 8 Pain Management and Medication Current Pain Management: Electronic Signature(s) Signed: 12/19/2022 8:38:45 AM By: [name] Coria RN Entered By: [name] on 12/18/2022 15:04:34 -------------------------------------------------------------------------------- Patient/Caregiver Education Details Patient Name: Date of Service: [name], [name] [name]. 2/29/2024andnbsp3:00 PM Medical Record Number: [id number] Patient Account Number: [id number] Date of Birth/Gender: Treating RN: [birth year] (57 y.o. [name] Primary Care Physician: [name] Other Clinician: Referring Physician: Treating Physician/Extender: [name] in Treatment: 16 Education Assessment Education Provided To: Patient Education Topics Provided Wound/Skin Impairment: Methods: Explain/Verbal Responses: State content correctly [employer]) Signed: 12/19/2022 8:38:45 AM By: [name] Coria RN Entered By: [name] on 12/18/2022 15:28:57 [name], [name] ([id number]) [id number] Page 7 of 8 -------------------------------------------------------------------------------- Wound Assessment Details Patient Name: Date of Service: [name] [name]. 12/18/2022 3:00 PM Medical Record Number: [id number] Patient Account Number:  [id number] Date of Birth/Sex: Treating RN: 1965/11/09 (57 y.o. [name]) [name] Primary Care Yarianna Varble: [name] Other Clinician: Referring Fontella Shan: Treating Jaecob Lowden/Extender: [name] in Treatment: 16 Wound Status Wound Number: 3 Primary Dehisced Wound Etiology: Wound Location: Left Amputation Site - Below Knee Wound Open Wounding Event: Surgical Injury Status: Date Acquired: 07/19/2022 Comorbid Cataracts, Anemia, Sleep Apnea, Arrhythmia, Congestive Heart Weeks Of Treatment: 16 History: Failure, Coronary Artery Disease, Hypertension, Myocardial Clustered Wound: No Infarction, Type II Diabetes, Neuropathy, Seizure Disorder Photos Wound Measurements Length: (cm) Width: (cm) Depth: (cm) Area: (cm) Volume: (cm) 0 % Reduction in Area: 100% 0 % Reduction in Volume: 100% 0 Epithelialization: Large (67-100%) 0 Tunneling: No 0 Undermining: No Wound Description Classification: Full Thickness Without Exposed Support Exudate Amount: None Present Structures Foul Odor After Cleansing: No Slough/Fibrino No Wound Bed Granulation Amount: None Present (0%) Exposed Structure Necrotic Amount: None Present (0%) Fascia Exposed: No Fat Layer (Subcutaneous Tissue) Exposed: No Tendon Exposed: No Muscle Exposed: No Joint Exposed: No Bone Exposed: No Electronic Signature(s) Signed: 12/19/2022 8:38:45 AM By: [name] Coria RN Entered By: [name]  on 12/18/2022 15:19:14 [place], RAYDER CREESE ([id number]) [id number] Page 8 of 8 -------------------------------------------------------------------------------- Vitals Details Patient Name: Date of Service: [name] [name]. 12/18/2022 3:00 PM Medical Record Number: [id number] Patient Account Number: [id number] Date of Birth/Sex: Treating RN: 1965-11-19 (57 y.o. [name]) [name] Primary Care Maryem Shuffler: [name] Other Clinician: Referring Kymari Lollis: Treating Shaurya Rawdon/Extender: [name] in Treatment: 16 Vital Signs Time Taken: 15:03 Temperature (F): 98.2 Height (in): 70 Pulse (bpm): 70 Weight (lbs): 166 Respiratory Rate (breaths/min): 18 Body Mass Index (BMI): 23.8 Blood Pressure (mmHg): 142/90 Reference Range: 80 - 120 mg / dl Electronic Signature(s) Signed: 12/19/2022 8:38:45 AM By: [name] Coria RN Entered By: [name] on 12/18/2022 15:04:12

## 2022-12-25 DIAGNOSIS — Z89512 Acquired absence of left leg below knee: Secondary | ICD-10-CM | POA: Insufficient documentation

## 2023-01-07 ENCOUNTER — Telehealth: Payer: Self-pay | Admitting: *Deleted

## 2023-01-07 NOTE — Progress Notes (Signed)
  Care Coordination Note  01/07/2023 Name: [name] MRN: [id number] DOB: Feb 01, 1966  [name] is a 57 y.o. year old male who is a primary care patient of [name], MD. I reached out to Valla Leaver by phone today to assist with re-scheduling a follow up visit with the Licensed Clinical Social Worker  Follow up plan: Unsuccessful telephone outreach attempt made. A HIPAA compliant phone message was left for the patient providing contact information and requesting a return call.   [place]  Direct Dial: [phone]

## 2023-01-07 NOTE — Progress Notes (Signed)
SDW call     PCP - Dr. Cathlean Cower Cardiologist -  Nephrologist: [facility] Wound Care: Dr. Linton Ham   PPM/ICD - No Device Orders -n/a  Rep Notified - n/a   Chest x-ray - 09/12/21 EKG -  Will need DOS, 01/09/2023 Stress Test - ECHO -  Cardiac Cath - 09/24/2020  Sleep Study/sleep apnea/CPAP:  Type II Diabetic Fasting Blood sugar range How often check sugars   Blood Thinner Instructions: Patient is on Plavix, hold per surgeons instructions Aspirin Instructions:Patient is taking ASA, hold per surgeons instructions   ERAS Protcol - Yes, clear liquids until 3 hours prior to surgery, stop at 1215 PRE-SURGERY Ensure or G2- No   COVID TEST- n/a     Anesthesia review:Yes, sent to anesthesia 01/08/2023. Hx HTN, CAD, DM, ESRD with hemodialysis    Patient denies shortness of breath, fever, cough and chest pain over the phone call    Your procedure is scheduled on Friday January 09, 2023  Report to [facility] Entrance "A" at 1245P.M., then check in with the Admitting office.  Call this number if you have problems the morning of surgery:  [phone]   If you have any questions prior to your surgery date call [phone]: Open Monday-Friday 8am-4pm If you experience any cold or flu symptoms such as cough, fever, chills, shortness of breath, etc. between now and your scheduled surgery, please notify us at the above number     Remember:  Do not eat after midnight the night before your surgery  You may drink clear liquids until 1215 the afternoon of your surgery.   Clear liquids allowed are: Water, Non-Citrus Juices (without pulp), Carbonated Beverages, Clear Tea, Black Coffee ONLY (NO MILK, CREAM OR POWDERED CREAMER of any kind), and Gatorade   Take these medicines the morning of surgery with A SIP OF WATER:  Repatha, hydrolazine, imdur, lopressor, protonix   Please hold Atropine eye drops the day of surgery.   As of today, STOP taking any Aspirin  (unless otherwise instructed by your surgeon) Aleve, Naproxen, Ibuprofen, Motrin, Advil, Goody's, BC's, all herbal medications, fish oil, and all vitamins.

## 2023-01-07 NOTE — Progress Notes (Signed)
  Care Coordination Note  01/07/2023 Name: YOSGAR WEISENBERG MRN: [id number] DOB: 09/04/1966  Valla Leaver is a 57 y.o. year old male who is a primary care patient of [name], MD. I reached out to Valla Leaver by phone today to assist with re-scheduling a follow up visit with the Licensed Clinical Social Worker  Follow up plan: Telephone appointment with care management team member scheduled for:01/27/23  [place]  Direct Dial: [phone]

## 2023-01-08 ENCOUNTER — Encounter (HOSPITAL_COMMUNITY): Payer: Self-pay | Admitting: Ophthalmology

## 2023-01-08 ENCOUNTER — Ambulatory Visit (HOSPITAL_COMMUNITY): Payer: Self-pay | Admitting: Ophthalmology

## 2023-01-08 ENCOUNTER — Other Ambulatory Visit: Payer: Self-pay

## 2023-01-08 DIAGNOSIS — E113592 Type 2 diabetes mellitus with proliferative diabetic retinopathy without macular edema, left eye: Secondary | ICD-10-CM

## 2023-01-08 DIAGNOSIS — H33022 Retinal detachment with multiple breaks, left eye: Secondary | ICD-10-CM

## 2023-01-08 NOTE — Progress Notes (Signed)
Spoke with pt for pre-op call. Pt has hx of CAD, HTN, Diabetes and CKD. Pt's states his most recent A1C was 6.0 two weeks ago. He states his fasting blood sugar is usually around 94-120. Pt is not on any diabetic medications at this time.   Instructed pt to check his blood sugar in the AM and every 2 hours until he leaves for the hospital. If blood sugar is 70 or below, treat with 1/2 cup of clear juice (apple or cranberry) and recheck blood sugar 15 minutes after drinking juice. If blood sugar continues to be 70 or below, call the Short Stay department and ask to speak to a nurse.   Shower instructions given to pt.

## 2023-01-08 NOTE — Anesthesia Preprocedure Evaluation (Addendum)
Anesthesia Evaluation  Patient identified by MRN, date of birth, ID band Patient awake    Reviewed: Allergy & Precautions, H&P , NPO status , Patient's Chart, lab work & pertinent test results, reviewed documented beta blocker date and time   History of Anesthesia Complications (+) PONV and history of anesthetic complications  Airway Mallampati: III  TM Distance: >3 FB Neck ROM: Full    Dental no notable dental hx.    Pulmonary  Snores occasionally, has never had sleep study    Pulmonary exam normal breath sounds clear to auscultation       Cardiovascular hypertension (115/83 preop), Pt. on medications and Pt. on home beta blockers + CAD, + Past MI and + Peripheral Vascular Disease  Normal cardiovascular exam+ Valvular Problems/Murmurs (mod AS) AS  Rhythm:Regular Rate:Normal  LHC 08/23/21 ([facility]): Impressions:  1. The proximal LAD has diffuse mild disease.  Within the mid LAD there is a long segment of stent with modest 30 to 40% in-stent restenosis.  The apical LAD is subtotally occluded.  It is a very small vessel in the segment.  There is a very small first   diagonal branch which is also subtotally occluded.  A medium sized second diagonal branch has a stent in its ostial segment with approximately 80% in-stent restenosis.  There is an inferior branch of this diagonal branch which is occluded and fills via  left to left collaterals.  There is a small ramus branch which has diffuse 90% disease as well as a small OM1 branch of the left circumflex which has diffuse 90% disease.  Within the mid circumflex there is mild to moderate disease and severe disease in  the distal circumflex which is also a small vessel.  The RCA has nonobstructive disease with a widely patent stent in the PL branch.  2.  Severely elevated LVEDP  3.  Severe systemic hypertension, patient was given multiple doses of medications including labetalol and  hydralazine to address the blood pressure  4.  Despite premedication for possible allergy, the patient developed severe pruritus of his lower legs, treated with additional Benadryl and steroids.  No other signs of any systemic allergic reaction were present.  5.  Moderate sedation with IV Benadryl, fentanyl, and Versed for greater than 20 minutes was personally supervised by me with no sedation issues    Recommendations:  1. Left femoral arterial sheath will be removed when blood pressure has improved  2.  No good targets for PCI identified.  Would recommend continued aggressive risk factor modification medical therapy  3.  Volume removal with hemodialysis in setting of severely elevated LVEDP    RHC/LHC 08/07/20: 1. Severe 3 vessel obstructive disease involving multiple branch vessels.  2. Severely elevated LV filling pressures. 30-33 mmHg 3. Severe pulmonary HTN- mean PAP 54 mm Hg 4. Good cardiac output. Index 5.33.    DES OM2, unsuccessful attempt at PCI D2 due to small, tortuous vessel 09/24/20; DES D2, DES mid LAD, DES rPDA 10/16/20; NSTEMI overlapping DES LAD 04/08/21 for ISR    preoperative echo showed severe pulmonary HTN with a RVSP of 78mmHg. He also had a newly depressed EF to 40%, new wall motion abnormalities, and moderate AS (increased from mild).    Neuro/Psych negative neurological ROS  negative psych ROS   GI/Hepatic Neg liver ROS,GERD  Medicated and Controlled,,  Endo/Other  diabetes, Well Controlled, Type 2Hypothyroidism    Renal/GU ESRF and DialysisRenal diseaseK 5.0 Last HD yesterday  negative genitourinary  Musculoskeletal negative musculoskeletal ROS (+)    Abdominal   Peds negative pediatric ROS (+)  Hematology negative hematology ROS (+)   Anesthesia Other Findings Retinal detachment    Dr. Tobe Sos was contacted by his [facility] anesthesia team prior to patient's 05/06/22 TMA after preoperative echo showed severe pulmonary HTN with a RVSP of 61mmHg.  He also had a newly depressed EF to 40%, new wall motion abnormalities, and moderate AS (increased from mild). Given nature of surgery, Dr. Tobe Sos did not recommend preoperative cardiology evaluation or testing but preferred regional anesthesia if able. Out patient cardiology follow-up was planned for 05/16/22, but Mr. 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 05/21/22 surgery was done with GETA (video laryngoscopy, McGrath # 4, 7.5 ETT).  Reproductive/Obstetrics negative OB ROS                             Anesthesia Physical Anesthesia Plan  ASA: 4  Anesthesia Plan: MAC   Post-op Pain Management:    Induction: Intravenous  PONV Risk Score and Plan:   Airway Management Planned: Natural Airway and Simple Face Mask  Additional Equipment:   Intra-op Plan:   Post-operative Plan: Extubation in OR  Informed Consent: I have reviewed the patients History and Physical, chart, labs and discussed the procedure including the risks, benefits and alternatives for the proposed anesthesia with the patient or authorized representative who has indicated his/her understanding and acceptance.     Dental advisory given  Plan Discussed with: CRNA  Anesthesia Plan Comments: (Severe CAD with poor targets on last cath, severe pulmonary HTN- very high risk of perioperative complications, urgent procedure- will proceed with MAC )       Anesthesia Quick Evaluation

## 2023-01-08 NOTE — Progress Notes (Signed)
Anesthesia Chart Review: SAME DAY WORK-UP  Case: [id number] Date/Time: 01/09/23 1500   Procedures:      PARS PLANA VITRECTOMY WITH 25 GAUGE (Left)     INJECTION OF SILICONE OIL (Left)     MEMBRANE PEEL (Left)   Anesthesia type: Monitor Anesthesia Care   Pre-op diagnosis: RETINAL DETACHMENT OF LEFT EYE   Location: [facility] OR ROOM 08 / [place] [facility]   Surgeons: Hurman Horn, MD       DISCUSSION: Patient is a 57 year old male scheduled for the above procedure.  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  He was last evaluated at [facility] on 12/10/21 by Dulce Sellar, PA with Dr. Ernesta Amble. He is s/p DES OM2, D2, LAD, rPDA, and most recently overlapping DES LAD 04/08/21 for ISR. Last cath was in 08/23/21 for chest pain but no good targets for PCI found. Lifelong DAPT was considered, but was given permission to hold for more recent vascular surgeries since last DES > 1 year ago.  Dr. Tobe Sos was contacted by his [facility] anesthesia team prior to patient's 05/06/22 TMA after preoperative echo showed severe pulmonary HTN with a RVSP of 43mmHg. He also had a newly depressed EF to 40%, new wall motion abnormalities, and moderate AS (increased from mild). Given nature of surgery, Dr. Tobe Sos did not recommend preoperative cardiology evaluation or testing but preferred regional anesthesia if able. Out patient cardiology follow-up was planned for 05/16/22, but Mr. 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 05/21/22 surgery was done with GETA (video laryngoscopy, McGrath # 4, 7.5 ETT).   Current cardiac medication currently listed include ASA 81 mg daily, Plavix 75 mg daily, amlodipine 10 mg Q HS, Repatha 140 mg Q 14 days, hydralazine 25 mg BID, Imdur 30 mg daily, Lopressor 100 mg BID.   He reported instructions to hold ASA and Plavix per surgeon.  He is a same day work and will need labs/EKG as indicated on arrival. He has a significant cardiac and vascular history and is on hemodialysis. He now has a retinal detachment and is in need of surgery. He is a same day work-up, so anesthesia team to evaluate on the day of surgery. Case is posted for MAC anesthesia.  Discussed with anesthesiologist Stoltzfus, Belenda Cruise, DO.    VS:  BP Readings from Last 3 Encounters:  12/30/21 120/80  09/15/21 (!) 114/35  08/21/21 (!) 187/91   Pulse Readings from Last 3 Encounters:  12/30/21 70  09/15/21 78  08/21/21 77     PROVIDERS: [name], MD is listed PCP, but documented by PAT RN as Cathlean Cower, MD  Ernesta Amble, MD is cardiologist Terald Sleeper) Carmin Muskrat, [place] is vascular provider, last visit on 11/13/22 Linton Ham, MD is wound care provider [facility] is his nephrologist   LABS: For day of surgery. As of 10/27/22, A1c 5.5%. H/H 10.8/33.4 11/24/22 ([facility])    IMAGES: 1V CXR 07/30/22 ([facility]): Findings and Impression:  1. New bilateral  heterogeneous lung opacities most suggestive pulmonary  edema.  2. Pleural spaces are within normal limits.   CTA Head/Neck 10/27/21: IMPRESSION: 1. No intracranial large vessel occlusion.  2. High-grade stenosis of the bilateral vertebral arteries, right greater than left, at the ostia as well as right V4 segment. 3. Scattered part solid nodules bilaterally without prior comparison measuring up to 40mm in the right mid lung. Follow-up chest CT without contrast at 3-6 months is recommended. Subsequent  management will then be based upon the most suspicious nodule. (2017 Fleischner Guidelines) 4. Destructive end plate changes with associated disc height loss at C5-C6 without prior comparison. Findings are indeterminate but could be infectious etiology, Correlate with history and inflammatory markers.    EKG: EKG 07/25/22: Per Narrative in Care Everywhere Normal sinus rhythm  Markedly prolonged QT interval (430 QT, 546 QTc) Abnormal ECG  When compared with ECG of 18-Jul-2022 14:30,  Markedly prolonged QT interval is more prominent  I reviewed and concur with this report. Electronically signed [id number], MD, NEIL ([id number]) on 07/29/2022 10:00:15 PM   EKG 07/18/22: Per Narrative in Care Everywhere Normal sinus rhythm  Prolonged QT (410 QT, 501 QTc) Abnormal ECG  When compared with ECG of 25-Jun-2022 17:50,  premature ventricular complexes are no longer present  Left ventricular hypertrophy is now absent  I reviewed and concur with this report. Electronically signed [id number], MD, CHRISTOPHER [phone]) on 07/19/2022 11:34:40 AM   Last EKG seen in [facility] is from 09/15/21: Normal sinus rhythm Left ventricular hypertrophy with repolarization abnormality ( R in aVL ) Prolonged QT Abnormal ECG No significant change since last tracing Confirmed by End, Christopher [phone]) on 09/15/2021 10:29:06 AM   CV: Echo 05/01/22 ([facility]): INTERPRETATION  MODERATE LV SYSTOLIC DYSFUNCTION (Hypercontractile anterior, lateral, apical, posterior walls)   WITH MODERATE LVH. Estimated LVEF 40%. NORMAL RIGHT VENTRICULAR SYSTOLIC FUNCTION  MODERATE VALVULAR REGURGITATION (Moderate TR, mild MR, mild PR, trivial AR)  MODERATE VALVULAR STENOSIS (Moderate AS with possibly bicuspid AV, mean gradient 14.3 mmHg, peak gradient 27.2 mmHg, peak velocity 260.6 cm/sec)  NO PERICARDIAL EFFUSION  LV Note: GLS -8.9%  IVC IS NORMAL, COLLAPSES  ESTIMATED RVSP 25mmHg C/W SEVERE PULMONARY HTN  AV DI .36  COMPARED TO PREVIOUS TTE  REPORT DATED 08/22/2021, THE LV FX HAS DECREASED (NEW WMA), THE  TR/RVSP HAVE INCREASED, DEGREE OF AORTIC STENOSIS HAS PROGRESSED SLIGHTLY WITH AS LIKELY  MODERATE NOW  - Echo 08/22/21: LVEF > 55%, no regional wall motion abnormalities, tricuspid AV with mild AS (mean gradient 13.3 mmHg, peak gradient 23.7 mmHg, peak velocity 243.2 cm/sec. [place] 08/07/20 with severe pulmonary HTN, mean PAP 54 mmHg)   LHC 08/23/21 ([facility]): Impressions:  1. The proximal LAD has diffuse mild disease.  Within the mid LAD there is a long segment of stent with modest 30 to 40% in-stent restenosis.  The apical LAD is subtotally occluded.  It is a very small vessel in the segment.  There is a very small first   diagonal branch which is also subtotally occluded.  A medium sized second diagonal branch has a stent in its ostial segment with approximately 80% in-stent restenosis.  There is an inferior branch of this diagonal branch which is occluded and fills via  left to left collaterals.  There is a small ramus branch which has diffuse 90% disease as well as a small OM1 branch of the left circumflex which has diffuse 90% disease.  Within the mid circumflex there is mild to moderate disease and severe  disease in  the distal circumflex which is also a small vessel.  The RCA has nonobstructive disease with a widely patent stent in the PL branch.  2.  Severely elevated LVEDP  3.  Severe systemic hypertension, patient was given multiple doses of medications including labetalol and hydralazine to address the blood pressure  4.  Despite premedication for possible allergy, the patient developed severe pruritus of his lower legs, treated with additional Benadryl and steroids.  No other signs of any systemic allergic reaction were present.  5.  Moderate sedation with IV Benadryl, fentanyl, and Versed for greater than 20 minutes was personally supervised by me with no sedation issues    Recommendations:  1. Left femoral arterial sheath  will be removed when blood pressure has improved  2.  No good targets for PCI identified.  Would recommend continued aggressive risk factor modification medical therapy  3.  Volume removal with hemodialysis in setting of severely elevated LVEDP    RHC/LHC 08/07/20: 1. Severe 3 vessel obstructive disease involving multiple branch vessels.  2. Severely elevated LV filling pressures. 30-33 mmHg 3. Severe pulmonary HTN- mean PAP 54 mm Hg 4. Good cardiac output. Index 5.33.    Plan: need to optimize medical therapy and volume status.  His anatomy is poorly suited for revascularization due to small branch vessel disease. Only the second OM looks suitable for PCI and this alone would not significantly improve his outcome.    Past Medical History:  Diagnosis Date   Acute osteomyelitis of right foot ([place]) 05/15/2019   Anemia 06/23/2019   Aortic stenosis    Complication of anesthesia    Coronary artery disease    Diabetes with renal manifestations(250.4) 08/23/2013   Diabetic infection of right foot ([place])    Diabetic retinopathy associated with type 2 diabetes mellitus ([place]) 06/23/2019   ESRD on hemodialysis ([place]) 07/18/2011   Essential hypertension 10/18/2007   Qualifier: Diagnosis of  By: Loanne Drilling MD, Hilliard Clark A    History of blood transfusion    HYPERLIPIDEMIA 05/16/2008   Qualifier: Diagnosis of  By: Jenny Reichmann MD, Hunt Oris    MRSA INFECTION 10/18/2007   Qualifier: Diagnosis of  By: Reatha Armour, Lucy     Other forms of retinal detachment(361.89) 06/23/2019   PAD (peripheral artery disease) ([place])    PONV (postoperative nausea and vomiting)    Prolonged QT interval    Pulmonary hypertension ([place])    Secondary hyperparathyroidism ([place]) 06/23/2019   Sepsis ([place]) 05/14/2019    Past Surgical History:  Procedure Laterality Date   ABDOMINAL AORTOGRAM N/A 05/17/2019   Procedure: ABDOMINAL AORTOGRAM;  Surgeon: Elam Dutch, MD;  Location: [place] [facility];  Service: Cardiovascular;  Laterality:  N/A;   AMPUTATION TOE Right 05/14/2019   Procedure: PARTIAL AMPUTATION SECOND TOE RIGHT FOOT;  Surgeon: Evelina Bucy, DPM;  Location: [place];  Service: Podiatry;  Laterality: Right;   AMPUTATION TOE Right 05/23/2019   Procedure: AMPUTATION OF SECOND TOE METATARSAL PHALANGEAL JOINT;  Surgeon: Evelina Bucy, DPM;  Location: [place];  Service: Podiatry;  Laterality: Right;   AMPUTATION TOE Right 05/27/2019   Procedure: Amputation Toe;  Surgeon: Evelina Bucy, DPM;  Location: [place];  Service: Podiatry;  Laterality: Right;  right third toe   APPLICATION OF WOUND VAC Right 05/27/2019   Procedure: Application Of Wound Vac;  Surgeon: Evelina Bucy, DPM;  Location: [place];  Service: Podiatry;  Laterality: Right;   AV FISTULA PLACEMENT  06-18-11   Right  brachiocephalic AVF   BONE BIOPSY Right 05/14/2019   Procedure: OPEN SUPERFICIAL BONE BIOPSY GREAT TOE;  Surgeon: Evelina Bucy, DPM;  Location: [place];  Service: Podiatry;  Laterality: Right;   CARDIAC CATHETERIZATION     CORONARY BALLOON ANGIOPLASTY N/A 09/24/2020   Procedure: CORONARY BALLOON ANGIOPLASTY;  Surgeon: Martinique, Peter M, MD;  Location: [place] [facility];  Service: Cardiovascular;  Laterality: N/A;   CORONARY STENT INTERVENTION N/A 09/24/2020   Procedure: CORONARY STENT INTERVENTION;  Surgeon: Martinique, Peter M, MD;  Location: [place] [facility];  Service: Cardiovascular;  Laterality: N/A;   EYE SURGERY     left eye for Laser, diabetic retinopathy   FISTULOGRAM Right 11/06/2011   Procedure: FISTULOGRAM;  Surgeon: Conrad Faywood, MD;  Location: [facility] C[facility] LAB;  Service: Cardiovascular;  Laterality: Right;   HEMATOMA EVACUATION Right Feb. 25, 2014   HEMATOMA EVACUATION Right 12/14/2012   Procedure: EVACUATION HEMATOMA;  Surgeon: Conrad Monticello, MD;  Location: [place];  Service: Vascular;  Laterality: Right;   INCISION AND DRAINAGE Right 05/23/2019   Procedure: INCISION AND DRAINAGE OF RIGHT FOOT DEEP SPACE ABSCESS;  Surgeon: Evelina Bucy, DPM;   Location: [place];  Service: Podiatry;  Laterality: Right;   INSERTION OF DIALYSIS CATHETER  12/14/2012   Procedure: INSERTION OF DIALYSIS CATHETER;  Surgeon: Conrad Coppock, MD;  Location: [place];  Service: Vascular;;   INSERTION OF DIALYSIS CATHETER Right 01/07/2021   Procedure: INSERTION OF DIALYSIS CATHETER;  Surgeon: Elam Dutch, MD;  Location: [place];  Service: Vascular;  Laterality: Right;   INSERTION OF DIALYSIS CATHETER Left 01/08/2021   Procedure: INSERTION OF LEFT INTERNAL JUGULAR DIALYSIS CATHETER, tunneled;  Surgeon: Angelia Mould, MD;  Location: [place];  Service: Vascular;  Laterality: Left;   INTRAVASCULAR PRESSURE WIRE/FFR STUDY N/A 09/24/2020   Procedure: INTRAVASCULAR PRESSURE WIRE/FFR STUDY;  Surgeon: Martinique, Peter M, MD;  Location: [place] [facility];  Service: Cardiovascular;  Laterality: N/A;   IRRIGATION AND DEBRIDEMENT FOOT Right 05/25/2019   Procedure: INCISION AND DRAINAGE FOOT;  Surgeon: Evelina Bucy, DPM;  Location: [place];  Service: Podiatry;  Laterality: Right;   IRRIGATION AND DEBRIDEMENT FOOT Right 05/27/2019   Procedure: IRRIGATION AND DEBRIDEMENT FOOT;  Surgeon: Evelina Bucy, DPM;  Location: [place];  Service: Podiatry;  Laterality: Right;   LEFT HEART CATH AND CORONARY ANGIOGRAPHY N/A 09/24/2020   Procedure: LEFT HEART CATH AND CORONARY ANGIOGRAPHY;  Surgeon: Martinique, Peter M, MD;  Location: [place] [facility];  Service: Cardiovascular;  Laterality: N/A;   LOWER EXTREMITY ANGIOGRAPHY Bilateral 05/17/2019   Procedure: LOWER EXTREMITY ANGIOGRAPHY;  Surgeon: Elam Dutch, MD;  Location: [place] [facility];  Service: Cardiovascular;  Laterality: Bilateral;   REMOVAL OF A DIALYSIS CATHETER Right 01/08/2021   Procedure: REMOVAL OF TEMPORARY DIALYSIS CATHETER, LEFT FEMORAL VEIN;  Surgeon: Angelia Mould, MD;  Location: [place];  Service: Vascular;  Laterality: Right;   REVISON OF ARTERIOVENOUS FISTULA Right 12/14/2012   Procedure: REVISON OF upper arm  ARTERIOVENOUS FISTULA using 43mmx10cm gortex graft;  Surgeon: Conrad Wharton, MD;  Location: [place];  Service: Vascular;  Laterality: Right;   REVISON OF ARTERIOVENOUS FISTULA Right 12/27/2020   Procedure: RIGHT UPPER EXTREMITY ARTERIOVENOUS FISTULA REVISiON;  Surgeon: Cherre Robins, MD;  Location: [place];  Service: Vascular;  Laterality: Right;  PERIPHERAL NERVE BLOCK   RIGHT/LEFT HEART CATH AND CORONARY ANGIOGRAPHY N/A 08/07/2020   Procedure: RIGHT/LEFT HEART CATH AND CORONARY ANGIOGRAPHY;  Surgeon: Martinique, Peter M, MD;  Location: [place] [facility];  Service: Cardiovascular;  Laterality: N/A;   SOFT TISSUE MASS EXCISION     Right arm, Left leg  for MRSA infection    MEDICATIONS: No current facility-administered medications for this encounter.    amLODipine (NORVASC) 10 MG tablet   aspirin EC 81 MG tablet   atropine 1 % ophthalmic solution   carboxymethylcellulose (REFRESH TEARS) 0.5 % SOLN   clopidogrel (PLAVIX) 75 MG tablet   EPINEPHrine (EPIPEN 2-PAK) 0.3 mg/0.3 mL IJ SOAJ injection   ethyl chloride spray   Evolocumab (REPATHA SURECLICK) [id number] MG/ML SOAJ   hydrALAZINE (APRESOLINE) 50 MG tablet   isosorbide mononitrate (IMDUR) 30 MG 24 hr tablet   metoprolol tartrate (LOPRESSOR) 100 MG tablet   multivitamin (RENA-VIT) TABS tablet   nitroGLYCERIN (NITROSTAT) 0.4 MG SL tablet   pantoprazole (PROTONIX) 40 MG tablet   sevelamer carbonate (RENVELA) 800 MG tablet   sodium zirconium cyclosilicate (LOKELMA) 10 g PACK packet   traZODone (DESYREL) 150 MG tablet   Continuous Blood Gluc Sensor (FREESTYLE LIBRE 14 DAY SENSOR) MISC    Myra Gianotti, PA-C Surgical Short Stay/Anesthesiology [name] Phone [phone] [facility] Phone [phone] 01/08/2023 2:27 PM

## 2023-01-09 ENCOUNTER — Other Ambulatory Visit: Payer: Self-pay

## 2023-01-09 ENCOUNTER — Encounter (HOSPITAL_COMMUNITY): Payer: Self-pay | Admitting: Ophthalmology

## 2023-01-09 ENCOUNTER — Encounter (HOSPITAL_COMMUNITY)
Admission: RE | Disposition: A | Discharge status: Home / Self Care | Payer: Self-pay | Source: Home / Self Care | Attending: Ophthalmology

## 2023-01-09 ENCOUNTER — Ambulatory Visit (HOSPITAL_COMMUNITY): Payer: Medicare Other | Admitting: Vascular Surgery

## 2023-01-09 ENCOUNTER — Ambulatory Visit (HOSPITAL_COMMUNITY)
Admission: RE | Admit: 2023-01-09 | Discharge: 2023-01-09 | Disposition: A | Discharge status: Home / Self Care | Payer: Medicare Other | Attending: Ophthalmology | Admitting: Ophthalmology

## 2023-01-09 DIAGNOSIS — I120 Hypertensive chronic kidney disease with stage 5 chronic kidney disease or end stage renal disease: Secondary | ICD-10-CM | POA: Diagnosis not present

## 2023-01-09 DIAGNOSIS — H4312 Vitreous hemorrhage, left eye: Secondary | ICD-10-CM | POA: Insufficient documentation

## 2023-01-09 DIAGNOSIS — N186 End stage renal disease: Secondary | ICD-10-CM | POA: Diagnosis not present

## 2023-01-09 DIAGNOSIS — I2510 Atherosclerotic heart disease of native coronary artery without angina pectoris: Secondary | ICD-10-CM | POA: Diagnosis not present

## 2023-01-09 DIAGNOSIS — H3322 Serous retinal detachment, left eye: Secondary | ICD-10-CM | POA: Diagnosis not present

## 2023-01-09 DIAGNOSIS — N189 Chronic kidney disease, unspecified: Secondary | ICD-10-CM | POA: Diagnosis not present

## 2023-01-09 DIAGNOSIS — I129 Hypertensive chronic kidney disease with stage 1 through stage 4 chronic kidney disease, or unspecified chronic kidney disease: Secondary | ICD-10-CM | POA: Diagnosis not present

## 2023-01-09 DIAGNOSIS — I252 Old myocardial infarction: Secondary | ICD-10-CM | POA: Insufficient documentation

## 2023-01-09 DIAGNOSIS — H33022 Retinal detachment with multiple breaks, left eye: Secondary | ICD-10-CM

## 2023-01-09 DIAGNOSIS — E1151 Type 2 diabetes mellitus with diabetic peripheral angiopathy without gangrene: Secondary | ICD-10-CM | POA: Insufficient documentation

## 2023-01-09 DIAGNOSIS — E113592 Type 2 diabetes mellitus with proliferative diabetic retinopathy without macular edema, left eye: Secondary | ICD-10-CM | POA: Diagnosis present

## 2023-01-09 DIAGNOSIS — E1122 Type 2 diabetes mellitus with diabetic chronic kidney disease: Secondary | ICD-10-CM | POA: Diagnosis not present

## 2023-01-09 DIAGNOSIS — Z794 Long term (current) use of insulin: Secondary | ICD-10-CM

## 2023-01-09 DIAGNOSIS — Z992 Dependence on renal dialysis: Secondary | ICD-10-CM

## 2023-01-09 HISTORY — DX: Peripheral vascular disease, unspecified: I73.9

## 2023-01-09 HISTORY — DX: Pulmonary hypertension, unspecified: I27.20

## 2023-01-09 HISTORY — DX: Abnormal electrocardiogram (ECG) (EKG): R94.31

## 2023-01-09 HISTORY — DX: Nonrheumatic aortic (valve) stenosis: I35.0

## 2023-01-09 HISTORY — DX: Bursopathy, unspecified: M71.9

## 2023-01-09 HISTORY — PX: LASER PHOTO ABLATION: SHX5942

## 2023-01-09 HISTORY — PX: PARS PLANA VITRECTOMY: SHX2166

## 2023-01-09 HISTORY — PX: INJECTION OF SILICONE OIL: SHX6422

## 2023-01-09 LAB — POCT I-STAT, CHEM 8
BUN: 38 mg/dL — ABNORMAL HIGH (ref 6–20)
Calcium, Ion: 1.11 mmol/L — ABNORMAL LOW (ref 1.15–1.40)
Chloride: 101 mmol/L (ref 98–111)
Creatinine, Ser: 6 mg/dL — ABNORMAL HIGH (ref 0.61–1.24)
Glucose, Bld: 121 mg/dL — ABNORMAL HIGH (ref 70–99)
HCT: 37 % — ABNORMAL LOW (ref 39.0–52.0)
Hemoglobin: 12.6 g/dL — ABNORMAL LOW (ref 13.0–17.0)
Potassium: 5.1 mmol/L (ref 3.5–5.1)
Sodium: 138 mmol/L (ref 135–145)
TCO2: 31 mmol/L (ref 22–32)

## 2023-01-09 LAB — GLUCOSE, CAPILLARY: Glucose-Capillary: 116 mg/dL — ABNORMAL HIGH (ref 70–99)

## 2023-01-09 SURGERY — PARS PLANA VITRECTOMY WITH 25 GAUGE
Anesthesia: Monitor Anesthesia Care | Site: Eye | Laterality: Left

## 2023-01-09 MED ORDER — GENTAMICIN SULFATE 40 MG/ML IJ SOLN
INTRAMUSCULAR | Status: AC
  Filled 2023-01-09: qty 2

## 2023-01-09 MED ORDER — ORAL CARE MOUTH RINSE: 15.0000 mL | Freq: Once | OROMUCOSAL | Status: AC

## 2023-01-09 MED ORDER — NA CHONDROIT SULF-NA HYALURON 40-30 MG/ML IO SOSY
INTRAOCULAR | Status: AC
  Filled 2023-01-09: qty 0.5

## 2023-01-09 MED ORDER — PHENYLEPHRINE HCL 2.5 % OP SOLN
1.0000 [drp] | OPHTHALMIC | Status: AC | PRN
  Administered 2023-01-09 (×3): 1 [drp] via OPHTHALMIC
  Filled 2023-01-09: qty 2

## 2023-01-09 MED ORDER — TETRACAINE HCL 0.5 % OP SOLN
OPHTHALMIC | Status: AC
  Filled 2023-01-09: qty 4

## 2023-01-09 MED ORDER — POLYMYXIN B SULFATE 500000 UNITS IJ SOLR
INTRAMUSCULAR | Status: AC
  Filled 2023-01-09: qty 10

## 2023-01-09 MED ORDER — TROPICAMIDE 1 % OP SOLN
1.0000 [drp] | OPHTHALMIC | Status: AC | PRN
  Administered 2023-01-09 (×3): 1 [drp] via OPHTHALMIC
  Filled 2023-01-09: qty 15

## 2023-01-09 MED ORDER — LIDOCAINE HCL 2 % IJ SOLN
INTRAMUSCULAR | Status: DC | PRN
  Administered 2023-01-09: 1 mL

## 2023-01-09 MED ORDER — ROPIVACAINE HCL 5 MG/ML IJ SOLN
INTRAMUSCULAR | Status: DC | PRN
  Administered 2023-01-09: 1 mL

## 2023-01-09 MED ORDER — 0.9 % SODIUM CHLORIDE (POUR BTL) OPTIME
TOPICAL | Status: DC | PRN
  Administered 2023-01-09: 1000 mL

## 2023-01-09 MED ORDER — TOBRAMYCIN-DEXAMETHASONE 0.3-0.1 % OP OINT
TOPICAL_OINTMENT | OPHTHALMIC | Status: DC | PRN
  Administered 2023-01-09: 1 via OPHTHALMIC

## 2023-01-09 MED ORDER — BSS IO SOLN
INTRAOCULAR | Status: DC | PRN
  Administered 2023-01-09: 15 mL via INTRAOCULAR

## 2023-01-09 MED ORDER — PROPOFOL 10 MG/ML IV BOLUS
INTRAVENOUS | Status: AC
  Filled 2023-01-09: qty 20

## 2023-01-09 MED ORDER — GATIFLOXACIN 0.5 % OP SOLN
1.0000 [drp] | OPHTHALMIC | Status: AC | PRN
  Administered 2023-01-09 (×3): 1 [drp] via OPHTHALMIC
  Filled 2023-01-09: qty 2.5

## 2023-01-09 MED ORDER — TOBRAMYCIN-DEXAMETHASONE 0.3-0.1 % OP OINT
TOPICAL_OINTMENT | OPHTHALMIC | Status: AC
  Filled 2023-01-09: qty 3.5

## 2023-01-09 MED ORDER — DEXAMETHASONE SODIUM PHOSPHATE 10 MG/ML IJ SOLN
INTRAMUSCULAR | Status: AC
  Filled 2023-01-09: qty 1

## 2023-01-09 MED ORDER — MIDAZOLAM HCL 2 MG/2ML IJ SOLN
INTRAMUSCULAR | Status: AC
  Filled 2023-01-09: qty 2

## 2023-01-09 MED ORDER — NA CHONDROIT SULF-NA HYALURON 40-30 MG/ML IO SOSY
INTRAOCULAR | Status: DC | PRN
  Administered 2023-01-09: .5 mL via INTRAOCULAR

## 2023-01-09 MED ORDER — FENTANYL CITRATE (PF) 250 MCG/5ML IJ SOLN
INTRAMUSCULAR | Status: AC
  Filled 2023-01-09: qty 5

## 2023-01-09 MED ORDER — LIDOCAINE HCL 2 % IJ SOLN
INTRAMUSCULAR | Status: AC
  Filled 2023-01-09: qty 20

## 2023-01-09 MED ORDER — EPINEPHRINE PF 1 MG/ML IJ SOLN
INTRAMUSCULAR | Status: AC
  Filled 2023-01-09: qty 1

## 2023-01-09 MED ORDER — PROPOFOL 10 MG/ML IV BOLUS
INTRAVENOUS | Status: DC | PRN
  Administered 2023-01-09: 30 ug/kg/min via INTRAVENOUS

## 2023-01-09 MED ORDER — BSS IO SOLN
INTRAOCULAR | Status: AC
  Filled 2023-01-09: qty 15

## 2023-01-09 MED ORDER — MIDAZOLAM HCL 2 MG/2ML IJ SOLN
INTRAMUSCULAR | Status: DC | PRN
  Administered 2023-01-09: 2 mg via INTRAVENOUS

## 2023-01-09 MED ORDER — ONDANSETRON HCL 4 MG/2ML IJ SOLN
INTRAMUSCULAR | Status: AC
  Filled 2023-01-09: qty 2

## 2023-01-09 MED ORDER — FENTANYL CITRATE (PF) 250 MCG/5ML IJ SOLN
INTRAMUSCULAR | Status: DC | PRN
  Administered 2023-01-09: 50 ug via INTRAVENOUS
  Administered 2023-01-09: 25 ug via INTRAVENOUS

## 2023-01-09 MED ORDER — BSS PLUS IO SOLN
INTRAOCULAR | Status: AC
  Filled 2023-01-09: qty 500

## 2023-01-09 MED ORDER — SODIUM HYALURONATE 10 MG/ML IO SOLUTION
PREFILLED_SYRINGE | INTRAOCULAR | Status: AC
  Filled 2023-01-09: qty 0.85

## 2023-01-09 MED ORDER — CHLORHEXIDINE GLUCONATE 0.12 % MT SOLN
15.0000 mL | Freq: Once | OROMUCOSAL | Status: AC
  Administered 2023-01-09: 15 mL via OROMUCOSAL
  Filled 2023-01-09: qty 15

## 2023-01-09 MED ORDER — SODIUM CHLORIDE (PF) 0.9 % IJ SOLN
INTRAMUSCULAR | Status: AC
  Filled 2023-01-09: qty 10

## 2023-01-09 MED ORDER — SODIUM HYALURONATE 10 MG/ML IO SOLUTION
PREFILLED_SYRINGE | INTRAOCULAR | Status: DC | PRN
  Administered 2023-01-09: .85 mL via INTRAOCULAR

## 2023-01-09 MED ORDER — SODIUM CHLORIDE 0.9 % IV SOLN: INTRAVENOUS | Status: DC

## 2023-01-09 MED ORDER — DEXAMETHASONE SODIUM PHOSPHATE 10 MG/ML IJ SOLN
INTRAMUSCULAR | Status: DC | PRN
  Administered 2023-01-09: 10 mg

## 2023-01-09 MED ORDER — TETRACAINE HCL 0.5 % OP SOLN
OPHTHALMIC | Status: DC | PRN
  Administered 2023-01-09: 2 [drp] via OPHTHALMIC

## 2023-01-09 MED ORDER — EPINEPHRINE PF 1 MG/ML IJ SOLN
INTRAOCULAR | Status: DC | PRN
  Administered 2023-01-09: 500 mL

## 2023-01-09 MED ORDER — INSULIN ASPART 100 UNIT/ML IJ SOLN: 0.0000 [IU] | INTRAMUSCULAR | Status: DC | PRN

## 2023-01-09 MED ORDER — STERILE WATER FOR INJECTION IJ SOLN
INTRAMUSCULAR | Status: DC | PRN
  Administered 2023-01-09: 1000 mL

## 2023-01-09 MED ORDER — ROPIVACAINE HCL 5 MG/ML IJ SOLN
INTRAMUSCULAR | Status: AC
  Filled 2023-01-09: qty 30

## 2023-01-09 SURGICAL SUPPLY — 63 items
APL SRG 3 HI ABS STRL LF PLS (MISCELLANEOUS)
APL SWBSTK 6 STRL LF DISP (MISCELLANEOUS) ×2
APPLICATOR COTTON TIP 6 STRL (MISCELLANEOUS) ×2 IMPLANT
APPLICATOR COTTON TIP 6IN STRL (MISCELLANEOUS) ×2
APPLICATOR DR MATTHEWS STRL (MISCELLANEOUS) IMPLANT
BAG COUNTER SPONGE SURGICOUNT (BAG) ×2 IMPLANT
BAG SPNG CNTER NS LX DISP (BAG) ×2
BAND WRIST GAS GREEN (MISCELLANEOUS) IMPLANT
CANNULA VLV SOFT TIP 25G (OPHTHALMIC) ×2 IMPLANT
CANNULA VLV SOFT TIP 25GA (OPHTHALMIC) ×2 IMPLANT
CORD BIPOLAR FORCEPS 12FT (ELECTRODE) IMPLANT
COVER MAYO STAND STRL (DRAPES) IMPLANT
DRAPE INCISE 51X51 W/FILM STRL (DRAPES) IMPLANT
DRAPE OPHTHALMIC 77X100 STRL (CUSTOM PROCEDURE TRAY) ×2 IMPLANT
FILTER BLUE MILLIPORE (MISCELLANEOUS) IMPLANT
FORCEPS ECKARDT ILM 25G SERR (OPHTHALMIC RELATED) IMPLANT
FORCEPS GRIESHABER ILM 25G A (INSTRUMENTS) IMPLANT
FORCEPS HORIZONTAL 25G DISP (OPHTHALMIC RELATED) IMPLANT
FORCEPS ILM 25G DSP TIP (MISCELLANEOUS) IMPLANT
GAS AUTO FILL CONSTEL (OPHTHALMIC)
GAS AUTO FILL CONSTELLATION (OPHTHALMIC) IMPLANT
GAS WRIST BAND GREEN (MISCELLANEOUS)
GLOVE SS BIOGEL STRL SZ 8.5 (GLOVE) ×2 IMPLANT
GOWN STRL REUS W/ TWL LRG LVL3 (GOWN DISPOSABLE) ×2 IMPLANT
GOWN STRL REUS W/ TWL XL LVL3 (GOWN DISPOSABLE) ×2 IMPLANT
GOWN STRL REUS W/TWL LRG LVL3 (GOWN DISPOSABLE) ×2
GOWN STRL REUS W/TWL XL LVL3 (GOWN DISPOSABLE) ×2
KIT BASIN OR (CUSTOM PROCEDURE TRAY) ×2 IMPLANT
LENS BIOM SUPER VIEW SET DISP (MISCELLANEOUS) ×2 IMPLANT
MICROPICK 25G (MISCELLANEOUS)
NDL 18GX1X1/2 (RX/OR ONLY) (NEEDLE) IMPLANT
NDL 25GX 5/8IN NON SAFETY (NEEDLE) IMPLANT
NDL FILTER BLUNT 18X1 1/2 (NEEDLE) IMPLANT
NDL HYPO 25GX1X1/2 BEV (NEEDLE) IMPLANT
NEEDLE 18GX1X1/2 (RX/OR ONLY) (NEEDLE) IMPLANT
NEEDLE 25GX 5/8IN NON SAFETY (NEEDLE) IMPLANT
NEEDLE FILTER BLUNT 18X1 1/2 (NEEDLE) IMPLANT
NEEDLE HYPO 25GX1X1/2 BEV (NEEDLE) IMPLANT
NS IRRIG 1000ML POUR BTL (IV SOLUTION) ×2 IMPLANT
OIL SILICONE OPHTHALMIC ADAPTO (Ophthalmic Related) IMPLANT
PACK FRAGMATOME (OPHTHALMIC) IMPLANT
PACK VITRECTOMY CUSTOM (CUSTOM PROCEDURE TRAY) ×2 IMPLANT
PAD ARMBOARD 7.5X6 YLW CONV (MISCELLANEOUS) ×4 IMPLANT
PAK PIK VITRECTOMY CVS 25GA (OPHTHALMIC) ×2 IMPLANT
PENCIL BIPOLAR 25GA STR DISP (OPHTHALMIC RELATED) IMPLANT
PICK MICROPICK 25G (MISCELLANEOUS) IMPLANT
PROBE LASER ILLUM FLEX CVD 25G (OPHTHALMIC) IMPLANT
ROLLS DENTAL (MISCELLANEOUS) IMPLANT
SCRAPER DIAMOND 25GA (OPHTHALMIC RELATED) IMPLANT
SET INJECTOR OIL FLUID CONSTEL (OPHTHALMIC) IMPLANT
STOCKINETTE IMPERVIOUS 9X36 MD (GAUZE/BANDAGES/DRESSINGS) ×4 IMPLANT
STOPCOCK 4 WAY LG BORE MALE ST (IV SETS) IMPLANT
SUT ETHILON 10 0 CS140 6 (SUTURE) IMPLANT
SUT ETHILON 8 0 BV130 4 (SUTURE) IMPLANT
SUT MERSILENE 5 0 RD 1 DA (SUTURE) IMPLANT
SUT PROLENE 10 0 CIF 4 DA (SUTURE) IMPLANT
SUT VICRYL 7 0 TG140 8 (SUTURE) IMPLANT
SYR 10ML LL (SYRINGE) ×2 IMPLANT
SYR 30ML SLIP (SYRINGE) IMPLANT
SYR 5ML LL (SYRINGE) IMPLANT
SYR TB 1ML LUER SLIP (SYRINGE) IMPLANT
WATER STERILE IRR 1000ML POUR (IV SOLUTION) ×2 IMPLANT
WIPE INSTRUMENT VISIWIPE 73X73 (MISCELLANEOUS) IMPLANT

## 2023-01-09 NOTE — Anesthesia Postprocedure Evaluation (Signed)
Anesthesia Post Note  Patient: [name]  Procedure(s) Performed: PARS PLANA VITRECTOMY WITH 25 GAUGE AND REPAIR OF RETINAL DETACHMENT (Left: Eye) INJECTION OF SILICONE OIL (Left) LASER PHOTO ABLATION (Left: Eye)     Patient location during evaluation: PACU Anesthesia Type: MAC Level of consciousness: awake and alert Pain management: pain level controlled Vital Signs Assessment: post-procedure vital signs reviewed and stable Respiratory status: spontaneous breathing, nonlabored ventilation and respiratory function stable Cardiovascular status: blood pressure returned to baseline and stable Postop Assessment: no apparent nausea or vomiting Anesthetic complications: no   No notable events documented.  Last Vitals:  Vitals:   01/09/23 1300  BP: 115/83  Pulse: (!) 58  Resp: 17  Temp: 36.9 C  SpO2: 97%    Last Pain:  Vitals:   01/09/23 1312  TempSrc:   PainSc: 0-No pain                 Pervis Hocking

## 2023-01-09 NOTE — H&P (Signed)
NAME: [place] RECORD NO: [id number] ACCOUNT NO: [id number] DATE OF BIRTH: 1966-09-08 FACILITY: [facility] LOCATION: [facility] PHYSICIAN: Clent Demark. Sindy Mccune, MD  History and Physical   DATE OF ADMISSION: 01/09/2023  HISTORY OF PRESENT ILLNESS:  A 57 year old man who has a history of type 2 diabetes, longstanding with proliferative diabetic retinopathy.  He presented 3 days previous with neovascularization of the iris, profound vision loss, vitreous hemorrhage,  progressive proliferative diabetic retinopathy and ultrasonographic evaluation, found to have tractional rhegmatogenous retinal detachment superiorly.  The patient has chronic kidney disease.  ALLERGIES:  THE PATIENT HAS ALLERGIES TO CRESTOR AND ATORVASTATIN, AMOXICILLIN, CHLORHEXIDINE, HYDROMORPHONE, PERCOCET, OXYCODONE, MORPHINE, GABAPENTIN.  Visual acuity in the left eye is count fingers.  Intraocular pressures are normal at 15.  IMPRESSION: 1.  Combined tractional rhegmatogenous retinal detachment, left eye. 2.  Progressive proliferative diabetic retinopathy, left eye. 3.  Iris neovascularization, left eye. 4.  Vitreous hemorrhage, left eye.  PLANNED PROCEDURE: Surgical intervention via posterior vitrectomy, endolaser photocoagulation, repair retinal detachment, installation silicone oil on the left eye.  This will be done under local MAC anesthesia.  The patient understands the risk of  anesthesia, but also to the eye including but not limited to hemorrhage, infection, scarring, need for another surgery.  No change of vision, loss of vision, progressive disease by intervention.   PUS D: 01/09/2023 3:08:19 pm T: 01/09/2023 5:19:00 pm  JOB: [id number]

## 2023-01-09 NOTE — H&P (Signed)
Dictated on phone system.  57 y/o man with traction rhegmatogenous retinal detachment left eye with vitreous hemorrhage and proliferative diabetic retinopathy.   Imp Traction retinal detachment left eye Proliferative diabetic retinopathy left eye Vitreous hemorrhage left eye  Plan Repair retinal detachment with vitrectomy, endolaser, drainage of subretinal fluid, instillation of silicone oil left eye.   Local mac anesthesia

## 2023-01-09 NOTE — Transfer of Care (Signed)
Immediate Anesthesia Transfer of Care Note  Patient: [name]  Procedure(s) Performed: PARS PLANA VITRECTOMY WITH 25 GAUGE AND REPAIR OF RETINAL DETACHMENT (Left: Eye) INJECTION OF SILICONE OIL (Left) LASER PHOTO ABLATION (Left: Eye)  Patient Location: PACU  Anesthesia Type:MAC  Level of Consciousness: awake and patient cooperative  Airway & Oxygen Therapy: Patient Spontanous Breathing and Patient connected to face mask oxygen  Post-op Assessment: Report given to RN, Post -op Vital signs reviewed and stable, and Patient moving all extremities  Post vital signs: Reviewed and stable  Last Vitals:  Vitals Value Taken Time  BP 107/56 01/09/23 1709  Temp    Pulse 51 01/09/23 1708  Resp 0 01/09/23 1708  SpO2 100 % 01/09/23 1708  Vitals shown include unvalidated device data.  Last Pain:  Vitals:   01/09/23 1312  TempSrc:   PainSc: 0-No pain         Complications: No notable events documented.

## 2023-01-10 ENCOUNTER — Encounter (HOSPITAL_COMMUNITY): Payer: Self-pay | Admitting: Ophthalmology

## 2023-01-13 NOTE — Brief Op Note (Signed)
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

## 2023-01-13 NOTE — Op Note (Signed)
[id number]  dictation completed, fully

## 2023-01-13 NOTE — Op Note (Signed)
NAME: [place] RECORD NO: [id number] ACCOUNT NO: [id number] DATE OF BIRTH: 08-04-1966 FACILITY: [facility] LOCATION: [facility] PHYSICIAN: Clent Demark. Dayten Juba, MD  Operative Report   DATE OF PROCEDURE: 01/09/2023  PREOPERATIVE DIAGNOSES: 1.  A dense vitreous hemorrhage, left eye. 2.  Proliferative diabetic retinopathy, left eye. 3.  Tractional detachment, left eye superiorly.  POSTOPERATIVE DIAGNOSES: 1.  A dense vitreous hemorrhage, left eye. 2.  Proliferative diabetic retinopathy, left eye. 3.  Tractional detachment, left eye superiorly.  PROCEDURE:  Repair of retinal detachment via vitrectomy, endolaser, air fluid exchange, injection silicone oil 99991111 centistokes, left eye.  SURGEON:  Clent Demark. Reyes Fifield, MD  ANESTHESIA:  Local, peribulbar with transconjunctival irrigation with local anesthesia with MAC anesthesia.  BLOOD LOSS: None.  COMPLICATIONS:  None.  INDICATIONS:  For procedure, the patient is a 57 year old man who has dense vitreous hemorrhage, iris neovascularization.  He had localized tractional detachment noted on B-scan ultrasonography preoperatively.  The patient understands this in attempt to  surgically reattach the retina.  He understands the risk of anesthesia including recurrence, death, loss of the eye from the underlying condition including but not limited to hemorrhage, infection, scarring, need for further surgery.  No change of  vision, loss of vision or progressive disease by intervention.  DESCRIPTION OF PROCEDURE:  After appropriate signed consent was obtained the patient was taken to the operating room.  In the operating room and appropriate monitors followed by mild sedation.  A surgical time out was carried out with staff and surgeon.   Thereafter, the left eye was sterilely prepped and draped in the usual ophthalmic fashion.  Topical anesthesia had been applied prior to this.  At this time, after the ocular surface was exposed a small conjunctival  incision was made in the  inferotemporal fornix to reach the subtenon space.  Using a blunt 17 gauge irrigating cannula a 1:1 mixture of meropenem and Xylocaine 2% was then irrigated approximately 2-1/2-3 mL in a peribulbar fashion in the inferotemporal quadrant.  Subtenons and  subconjunctival spread was facilitated using Q-tips surface wise.  Excellent akinesia and anesthesia was obtained.  At this time, 25 gauge trocar placed in the inferotemporal quadrant and verified visually.  The infusion was turned on.  Superior trocars  were applied.  Core vitrectomy was then begun.  Notable findings were diffuse dispersed vitreous hemorrhage.  It was necessary to deepen the anterior chamber, because of the iris neovascularization and hemorrhage there.  Paracentesis incision was made.   The anterior chamber was deepened and made to be completely clear by injecting Viscoat into the visual axis.  This allowed for adequate visualization posteriorly.  Thereafter, endolaser photocoagulation was placed inferiorly.  Tractional detachment was  resected superiorly.  The retina flattened nicely under air.  After endolaser photocoagulation in the panretinal fashion was completed, additionally.  At this time, an air silicone 99991111 centistoke oil exchange was carried out.  Excellent oil fill was  obtained.  A small amount of air was left in place to prevent overfill.  The conjunctival trocar sites were then closed with 7-0 Vicryl suture and the infusion was also removed and similarly closed with 7-0 Vicryl suture.  Subconjunctival Decadron was  applied.  Sterile patch and Fox shield were applied.  The patient tolerated the procedure well without complication, was taken to the PACU.   PUS D: 01/13/2023 12:57:05 pm T: 01/13/2023 1:11:00 pm  JOB: [id number]

## 2023-01-24 IMAGING — DX DG CHEST 1V PORT
1 series · 1 of 1 positions shown · non-contrast
Comparison: September 21, 2020

CLINICAL DATA: End-stage renal disease

EXAM:
PORTABLE CHEST 1 VIEW

[chest ap]
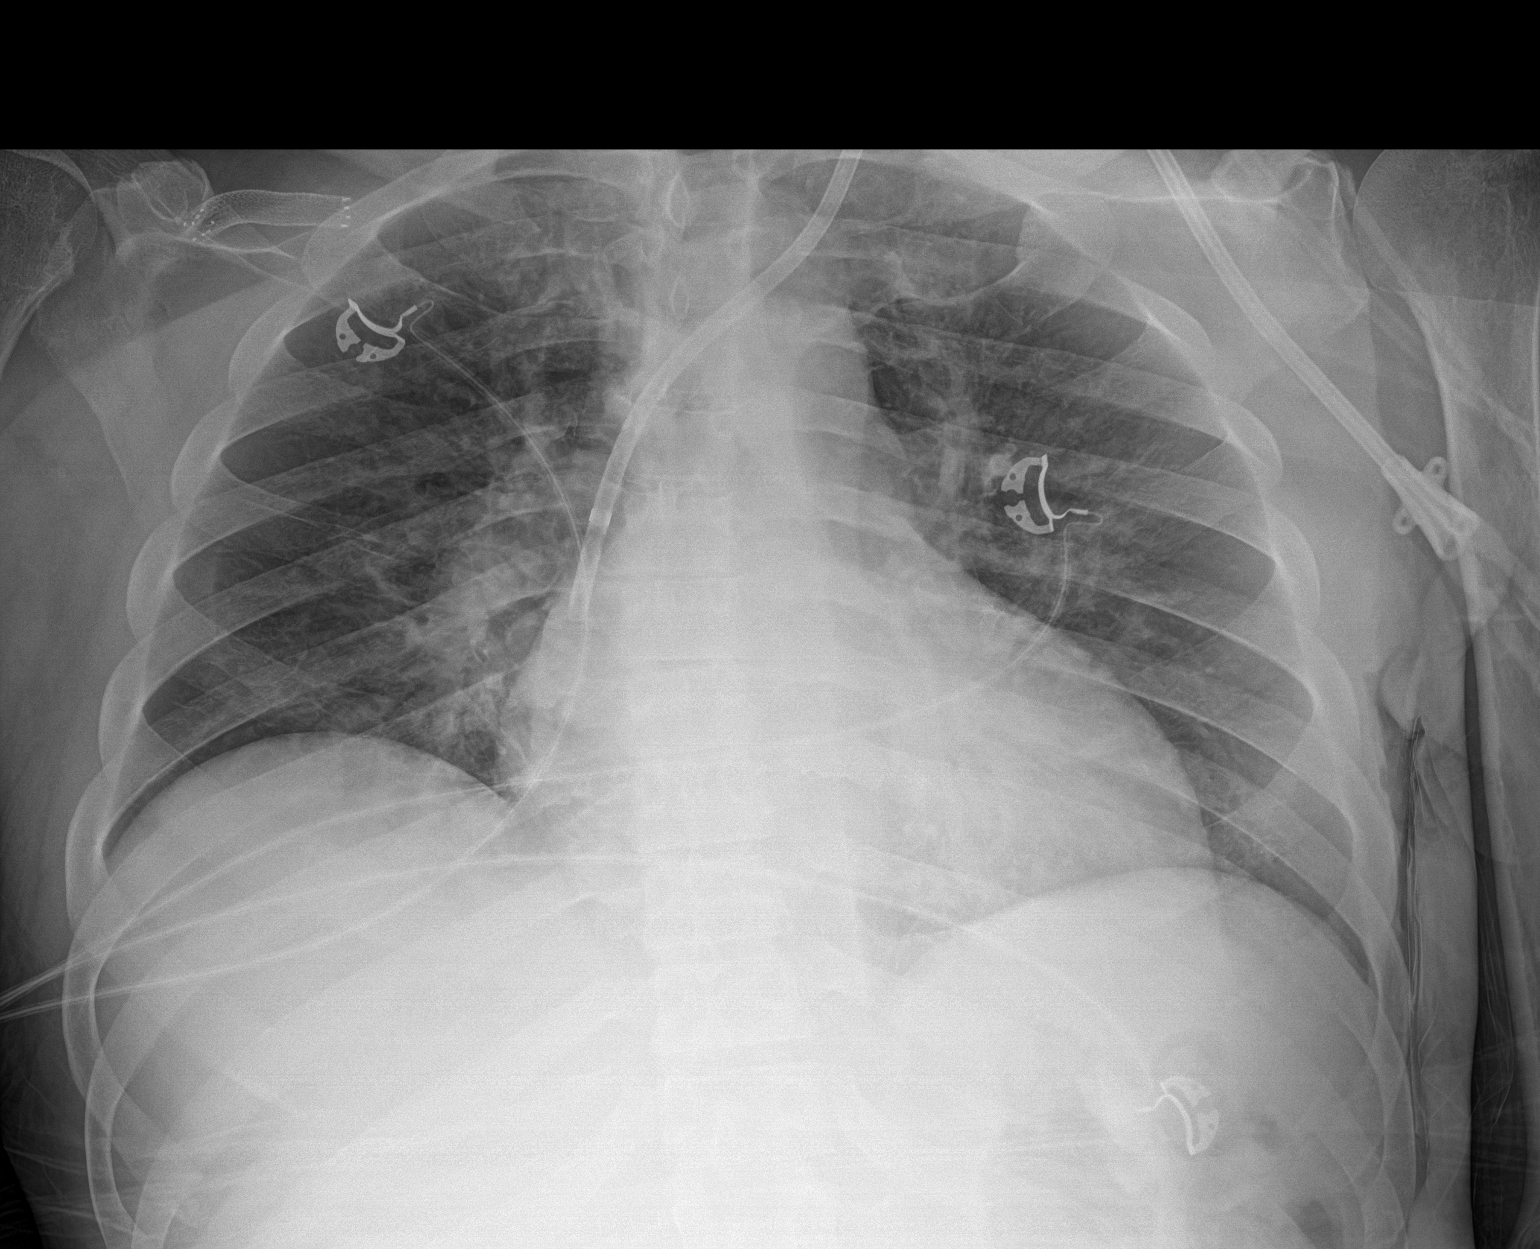

[1 of 1 positions shown; findings below may reference images not displayed]

FINDINGS: The heart size and mediastinal contours are mildly enlarged. A
left-sided central venous catheter seen with the tip at the superior
cavoatrial junction. There is prominence of the central pulmonary
vasculature. No pleural effusion. No acute osseous abnormality.
Vascular stent seen within the right axilla.
IMPRESSION: Mild cardiomegaly and pulmonary vascular congestion.

## 2023-01-27 ENCOUNTER — Ambulatory Visit: Payer: Self-pay | Admitting: Licensed Clinical Social Worker

## 2023-01-27 NOTE — Patient Outreach (Signed)
  Care Coordination   01/27/2023 Name: [name] MRN: [id number] DOB: 06/03/1966   Care Coordination Outreach Attempts:  An unsuccessful telephone outreach was attempted today to offer the patient information about available care coordination services as a benefit of their health plan.   Follow Up Plan:  Additional outreach attempts will be made to offer the patient care coordination information and services.   Encounter Outcome:  No Answer   Care Coordination Interventions:  No, not indicated    Kelton Pillar.Danesha Kirchoff MSW, LCSW Licensed Visual merchandiser [facility] Care Management [phone]

## 2023-01-28 ENCOUNTER — Ambulatory Visit: Payer: Self-pay | Admitting: Licensed Clinical Social Worker

## 2023-01-28 NOTE — Patient Instructions (Signed)
Visit Information  Thank you for taking time to visit with me today. Please don't hesitate to contact me if I can be of assistance to you.   Following are the goals we discussed today:   Goals Addressed               This Visit's Progress     Patient Stated he is concerned about finances. sometimes it is hard to pay monthly bills (pt-stated)        Interventions: LCSW spoke via phone with client today about client needs Client goes to dialysis 3 times weekly. He uses bus system to transport him to and from dialysis appointments Reviewed medication procurement. Reviewed pain issues. Reviewed family support. He has support from his spouse, Narvell Tollis Discussed ramp usage. He said ramp at home is very useful and helpful to him. Discussed uses of wheelchair for ambulation. Discussed use of stair lift to go up and down stairs at his home  Discussed mood of client. He said sometimes he is anxious related to finances. He said sometimes he has difficulty paying monthly bills due Discussed chest pain issues. He said he has been prescribed a medication to help with chest pain. He keeps this medication readily available Provided counseling support Encouraged client to call LCSW for SW support as needed at [phone] Jerimi was appreciative of call from LCSW today    Our next appointment is by telephone on 03/11/23 at 10:00 AM   Please call the care guide team at [phone] if you need to cancel or reschedule your appointment.   If you are experiencing a Mental Health or Behavioral Health Crisis or need someone to talk to, please go to [facility] [address], [place] [phone])   The patient verbalized understanding of instructions, educational materials, and care plan provided today and DECLINED offer to receive copy of patient instructions, educational materials, and care plan.   The patient has been provided with contact information for the  care management team and has been advised to call with any health related questions or concerns.   Kelton Pillar.Loyda Costin MSW, LCSW Licensed Visual merchandiser [facility] Care Management [phone]

## 2023-01-28 NOTE — Patient Outreach (Signed)
  Care Coordination   Follow Up Visit Note   01/28/2023 Name: PACE POITIER MRN: [id number] DOB: [birth year]  [name] is a 57 y.o. year old male who sees [name], MD for primary care. I spoke with  [name] by phone today.  What matters to the patients health and wellness today?  Patient is concerned about his finances. He said  sometimes it is hard to pay monthly bills due    Goals Addressed               This Visit's Progress     Patient Stated he is concerned about finances. sometimes it is hard to pay monthly bills (pt-stated)        Interventions: LCSW spoke via phone with client today about client needs Client goes to dialysis 3 times weekly. He uses bus system to transport him to and from dialysis appointments Reviewed medication procurement. Reviewed pain issues. Reviewed family support. He has support from his spouse, Lazerick Schulze Discussed ramp usage. He said ramp at home is very useful and helpful to him. Discussed uses of wheelchair for ambulation. Discussed use of stair lift to go up and down stairs at his home  Discussed mood of client. He said sometimes he is anxious related to finances. He said sometimes he has difficulty paying monthly bills due Discussed chest pain issues. He said he has been prescribed a medication to help with chest pain. He keeps this medication readily available Provided counseling support Encouraged client to call LCSW for SW support as needed at [phone] Ritchard was appreciative of call from LCSW today     SDOH assessments and interventions completed:  Yes  SDOH Interventions Today    Flowsheet Row Most Recent Value  SDOH Interventions   Depression Interventions/Treatment  Counseling  Physical Activity Interventions Other (Comments)  [uses ramp to access home. uses wheelchair to help him ambulate]  Stress Interventions Provide Counseling  [has stress in managing medical needs]        Care Coordination  Interventions:  Yes, provided   Interventions Today    Flowsheet Row Most Recent Value  Chronic Disease   Chronic disease during today's visit Other  [discussed client needs with Llana Aliment Legore]  General Interventions   General Interventions Discussed/Reviewed General Interventions Discussed, [employer]  [discussed dialysis treatment weekly. discussed his use of bus system to go to and from dialysis]  Exercise Interventions   Exercise Discussed/Reviewed Physical Activity  Physical Activity Discussed/Reviewed Physical Activity Reviewed  [discussed his use of wheelchair,  discussed his use of ramp at his home]  Mental Health Interventions   Mental Health Discussed/Reviewed Coping Strategies, Anxiety  [discussed anxiety or stress issues,  he is concerned about finances. sometimes it is hard to pay bills each month]  Pharmacy Interventions   Pharmacy Dicussed/Reviewed Pharmacy Topics Discussed  Safety Interventions   Safety Discussed/Reviewed Home Safety, Fall Risk       Follow up plan: Follow up call scheduled for 03/11/23 at 10:00 AM    Encounter Outcome:  Pt. Visit Completed   Kelton Pillar.Bryten Maher MSW, LCSW Licensed Visual merchandiser [facility] Care Management [phone]

## 2023-02-17 ENCOUNTER — Encounter: Payer: Self-pay | Admitting: Physical Therapy

## 2023-02-17 ENCOUNTER — Other Ambulatory Visit: Payer: Self-pay

## 2023-02-17 ENCOUNTER — Ambulatory Visit (INDEPENDENT_AMBULATORY_CARE_PROVIDER_SITE_OTHER): Payer: Medicare Other | Admitting: Physical Therapy

## 2023-02-17 DIAGNOSIS — M79662 Pain in left lower leg: Secondary | ICD-10-CM | POA: Diagnosis not present

## 2023-02-17 DIAGNOSIS — R2681 Unsteadiness on feet: Secondary | ICD-10-CM

## 2023-02-17 DIAGNOSIS — M6281 Muscle weakness (generalized): Secondary | ICD-10-CM

## 2023-02-17 DIAGNOSIS — R2689 Other abnormalities of gait and mobility: Secondary | ICD-10-CM

## 2023-02-17 NOTE — Therapy (Addendum)
OUTPATIENT PHYSICAL THERAPY PROSTHETICS EVALUATION   Patient Name: [name] MRN: [id number] DOB:[birth year], 57 y.o., male Today's Date: 02/17/2023  PCP: [name], MD REFERRING PROVIDER: Marcello Fennel, MD  END OF SESSION:  PT End of Session - 02/17/23 1556     Visit Number 1    Number of Visits 24   Date for PT Re-Evaluation 05/14/23    Authorization Type Medicare & BCBS    Progress Note Due on Visit 10    PT Start Time 0938    PT Stop Time 1015    PT Time Calculation (min) 37 min    Equipment Utilized During Treatment Gait belt    Activity Tolerance Patient tolerated treatment well;Patient limited by pain    Behavior During Therapy [facility] for tasks assessed/performed             Past Medical History:  Diagnosis Date   Acute osteomyelitis of right foot (HCC) 05/15/2019   Anemia 06/23/2019   Aortic stenosis    Bursitis    right shoulder   Complication of anesthesia    Coronary artery disease    Diabetes with renal manifestations(250.4) 08/23/2013   Diabetic infection of right foot (HCC)    Diabetic retinopathy associated with type 2 diabetes mellitus (HCC) 06/23/2019   ESRD on hemodialysis (HCC) 07/18/2011   Essential hypertension 10/18/2007   Qualifier: Diagnosis of  By: Everardo All MD, Gregary Signs A    History of blood transfusion    HYPERLIPIDEMIA 05/16/2008   Qualifier: Diagnosis of  By: Jonny Ruiz MD, Len Blalock    MRSA INFECTION 10/18/2007   Qualifier: Diagnosis of  By: Tyrone Apple, Lucy     Other forms of retinal detachment(361.89) 06/23/2019   PAD (peripheral artery disease) (HCC)    PONV (postoperative nausea and vomiting)    Prolonged QT interval    Pulmonary hypertension (HCC)    Secondary hyperparathyroidism (HCC) 06/23/2019   Sepsis (HCC) 05/14/2019   Past Surgical History:  Procedure Laterality Date   ABDOMINAL AORTOGRAM N/A 05/17/2019   Procedure: ABDOMINAL AORTOGRAM;  Surgeon: Sherren Kerns, MD;  Location: [facility] INVASIVE CV LAB;  Service:  Cardiovascular;  Laterality: N/A;   AMPUTATION TOE Right 05/14/2019   Procedure: PARTIAL AMPUTATION SECOND TOE RIGHT FOOT;  Surgeon: Park Liter, DPM;  Location: [facility] OR;  Service: Podiatry;  Laterality: Right;   AMPUTATION TOE Right 05/23/2019   Procedure: AMPUTATION OF SECOND TOE METATARSAL PHALANGEAL JOINT;  Surgeon: Park Liter, DPM;  Location: [facility] OR;  Service: Podiatry;  Laterality: Right;   AMPUTATION TOE Right 05/27/2019   Procedure: Amputation Toe;  Surgeon: Park Liter, DPM;  Location: [facility] OR;  Service: Podiatry;  Laterality: Right;  right third toe   APPLICATION OF WOUND VAC Right 05/27/2019   Procedure: Application Of Wound Vac;  Surgeon: Park Liter, DPM;  Location: [facility] OR;  Service: Podiatry;  Laterality: Right;   AV FISTULA PLACEMENT  06-18-11   Right brachiocephalic AVF   BONE BIOPSY Right 05/14/2019   Procedure: OPEN SUPERFICIAL BONE BIOPSY GREAT TOE;  Surgeon: Park Liter, DPM;  Location: [facility] OR;  Service: Podiatry;  Laterality: Right;   CARDIAC CATHETERIZATION     CORONARY BALLOON ANGIOPLASTY N/A 09/24/2020   Procedure: CORONARY BALLOON ANGIOPLASTY;  Surgeon: Swaziland, Peter M, MD;  Location: [facility] INVASIVE CV LAB;  Service: Cardiovascular;  Laterality: N/A;   CORONARY PRESSURE/FFR STUDY N/A 09/24/2020   Procedure: INTRAVASCULAR PRESSURE WIRE/FFR STUDY;  Surgeon: Swaziland, Peter M, MD;  Location: [facility] INVASIVE  CV LAB;  Service: Cardiovascular;  Laterality: N/A;   CORONARY STENT INTERVENTION N/A 09/24/2020   Procedure: CORONARY STENT INTERVENTION;  Surgeon: Swaziland, Peter M, MD;  Location: [facility] INVASIVE CV LAB;  Service: Cardiovascular;  Laterality: N/A;   EYE SURGERY     left eye for Laser, diabetic retinopathy   FISTULOGRAM Right 11/06/2011   Procedure: FISTULOGRAM;  Surgeon: Fransisco Hertz, MD;  Location: [facility] CATH LAB;  Service: Cardiovascular;  Laterality: Right;   HEMATOMA EVACUATION Right Feb. 25, 2014   HEMATOMA EVACUATION Right 12/14/2012   Procedure: EVACUATION HEMATOMA;   Surgeon: Fransisco Hertz, MD;  Location: [facility] OR;  Service: Vascular;  Laterality: Right;   INCISION AND DRAINAGE Right 05/23/2019   Procedure: INCISION AND DRAINAGE OF RIGHT FOOT DEEP SPACE ABSCESS;  Surgeon: Park Liter, DPM;  Location: [facility] OR;  Service: Podiatry;  Laterality: Right;   INJECTION OF SILICONE OIL Left 01/09/2023   Procedure: INJECTION OF SILICONE OIL;  Surgeon: Edmon Crape, MD;  Location: [facility] OR;  Service: Ophthalmology;  Laterality: Left;   INSERTION OF DIALYSIS CATHETER  12/14/2012   Procedure: INSERTION OF DIALYSIS CATHETER;  Surgeon: Fransisco Hertz, MD;  Location: [facility] OR;  Service: Vascular;;   INSERTION OF DIALYSIS CATHETER Right 01/07/2021   Procedure: INSERTION OF DIALYSIS CATHETER;  Surgeon: Sherren Kerns, MD;  Location: [facility] OR;  Service: Vascular;  Laterality: Right;   INSERTION OF DIALYSIS CATHETER Left 01/08/2021   Procedure: INSERTION OF LEFT INTERNAL JUGULAR DIALYSIS CATHETER, tunneled;  Surgeon: Chuck Hint, MD;  Location: [facility] OR;  Service: Vascular;  Laterality: Left;   IRRIGATION AND DEBRIDEMENT FOOT Right 05/25/2019   Procedure: INCISION AND DRAINAGE FOOT;  Surgeon: Park Liter, DPM;  [facility]: [facility] OR;  Service: Podiatry;  Laterality: Right;   IRRIGATION AND DEBRIDEMENT FOOT Right 05/27/2019   Procedure: IRRIGATION AND DEBRIDEMENT FOOT;  Surgeon: Park Liter, DPM;  Location: [facility] OR;  Service: Podiatry;  Laterality: Right;   LASER PHOTO ABLATION Left 01/09/2023   Procedure: LASER PHOTO ABLATION;  Surgeon: Edmon Crape, MD;  Location: [facility] OR;  Service: Ophthalmology;  Laterality: Left;   LEFT HEART CATH AND CORONARY ANGIOGRAPHY N/A 09/24/2020   Procedure: LEFT HEART CATH AND CORONARY ANGIOGRAPHY;  Surgeon: Swaziland, Peter M, MD;  Location: [facility] INVASIVE CV LAB;  Service: Cardiovascular;  Laterality: N/A;   LOWER EXTREMITY ANGIOGRAPHY Bilateral 05/17/2019   Procedure: LOWER EXTREMITY ANGIOGRAPHY;  Surgeon: Sherren Kerns, MD;  Location: [facility] INVASIVE CV LAB;   Service: Cardiovascular;  Laterality: Bilateral;   PARS PLANA VITRECTOMY Left 01/09/2023   Procedure: PARS PLANA VITRECTOMY WITH 25 GAUGE AND REPAIR OF RETINAL DETACHMENT;  Surgeon: Edmon Crape, MD;  Location: [facility] OR;  Service: Ophthalmology;  Laterality: Left;   REMOVAL OF A DIALYSIS CATHETER Right 01/08/2021   Procedure: REMOVAL OF TEMPORARY DIALYSIS CATHETER, LEFT FEMORAL VEIN;  Surgeon: Chuck Hint, MD;  Location: [facility] OR;  Service: Vascular;  Laterality: Right;   REVISON OF ARTERIOVENOUS FISTULA Right 12/14/2012   Procedure: REVISON OF upper arm ARTERIOVENOUS FISTULA using 59mmx10cm gortex graft;  Surgeon: Fransisco Hertz, MD;  Location: [facility] OR;  Service: Vascular;  Laterality: Right;   REVISON OF ARTERIOVENOUS FISTULA Right 12/27/2020   Procedure: RIGHT UPPER EXTREMITY ARTERIOVENOUS FISTULA REVISiON;  Surgeon: Leonie Douglas, MD;  Location: [facility] OR;  Service: Vascular;  Laterality: Right;  PERIPHERAL NERVE BLOCK   RIGHT/LEFT HEART CATH AND CORONARY ANGIOGRAPHY N/A 08/07/2020   Procedure: RIGHT/LEFT HEART CATH AND CORONARY  ANGIOGRAPHY;  Surgeon: Swaziland, Peter M, MD;  Location: [facility] INVASIVE CV LAB;  Service: Cardiovascular;  Laterality: N/A;   SOFT TISSUE MASS EXCISION     Right arm, Left leg  for MRSA infection   Patient Active Problem List   Diagnosis Date Noted   ACS (acute coronary syndrome) (HCC) 09/12/2021   Prolonged QT interval 09/12/2021   Unstable angina (HCC) 08/20/2021   Nuclear sclerotic cataract of right eye 06/11/2021   Nuclear sclerotic cataract of left eye 06/11/2021   Posterior subcapsular age-related cataract, left eye 06/11/2021   Stable treated proliferative diabetic retinopathy of left eye determined by examination associated with type 2 diabetes mellitus (HCC) 06/11/2021   Right epiretinal membrane 06/11/2021   Stable treated proliferative diabetic retinopathy of right eye determined by examination associated with type 2 diabetes mellitus (HCC) 06/11/2021    Hyperkalemia 01/09/2021   ESRD (end stage renal disease) (HCC)    Dialysis AV fistula malfunction, initial encounter (HCC) 01/07/2021   NSTEMI (non-ST elevated myocardial infarction) (HCC) 09/21/2020   3-vessel coronary artery disease 08/09/2020   Pulmonary hypertension, unspecified (HCC) 08/09/2020   Pancytopenia (HCC) 08/06/2020   Allergy, unspecified, subsequent encounter 06/28/2020   Anaphylactic shock, unspecified, subsequent encounter 06/28/2020   Lymphedema of both lower extremities 12/29/2019   Hyperphosphatemia 12/11/2019   Encounter for post surgical wound check 08/16/2019   Post-op pain 08/16/2019   S/P transmetatarsal amputation of foot, right (HCC) 08/16/2019   Unspecified protein-calorie malnutrition (HCC) 07/12/2019   PAD (peripheral artery disease) (HCC) 06/30/2019   Diabetic retinopathy associated with type 2 diabetes mellitus (HCC) 06/23/2019   Other forms of retinal detachment(361.89) 06/23/2019   Secondary hyperparathyroidism (HCC) 06/23/2019   Osteomyelitis, unspecified (HCC) 05/31/2019   Gangrene of toe of right foot (HCC)    Ulcer of right foot, with necrosis of bone (HCC)    Abscess of right foot    Diabetic infection of right foot (HCC)    Ulcer of right second toe with necrosis of bone (HCC)    Cellulitis of right foot 05/22/2019   Acute osteomyelitis of right foot (HCC) 05/15/2019   Osteomyelitis of great toe of right foot (HCC) 05/14/2019   Diabetic foot ulcer- right second toe 05/14/2019   Sepsis (HCC) 05/14/2019   Hypothyroidism, unspecified 01/11/2018   Hypercalcemia 06/14/2016   Dyslipidemia 05/11/2015   Hyperglycemia 05/05/2015   Diarrhea, unspecified 02/01/2015   Fever, unspecified 02/01/2015   Pain, unspecified 02/01/2015   Pruritus, unspecified 02/01/2015   Coagulation defect, unspecified (HCC) 07/07/2014   Encounter for removal of sutures 05/30/2014   Encounter for immunization 04/19/2014   Chest pain 08/23/2013   Type 2 diabetes mellitus  (HCC) 08/23/2013   Hemodialysis status (HCC) 08/23/2013   Chronic kidney disease, stage V (HCC) 08/23/2013   Obesity, unspecified 08/23/2013   Complication of vascular dialysis catheter 12/15/2012   Other fluid overload 12/07/2012   Other specified complication of vascular prosthetic devices, implants and grafts, subsequent encounter 08/06/2012   ESRD on dialysis (HCC) 07/18/2011   Pure hypercholesterolemia 06/23/2011   Iron deficiency anemia, unspecified 06/21/2011   HYPOGLYCEMIA, UNSPECIFIED 11/13/2008   RENAL INSUFFICIENCY 07/31/2008   FOOT ULCER, LEFT 06/29/2008   HYPERLIPIDEMIA 05/16/2008   MRSA INFECTION 10/18/2007   Essential hypertension 10/18/2007    ONSET DATE: 02/12/2023 prosthesis delivery  REFERRING DIAG: 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 He had I&D surgeries on left TTA on 07/19/22 & 08/21/22. He was hospitalized 01/09/23 due to left eye retinal detachment. He has history of right Transmetatarsal Amputation. He received prosthesis 02/12/2023.  Pt accompanied by: significant other  PERTINENT HISTORY: left BKA, right TMA, DM2, PAD, CAD, MI, ESRD on HD, moderate AS, HTN, hypothyroidism, anemia, CHF  PAIN:  Are you having pain? Yes: NPRS scale: this morning 0/10 and up to 8/10 Pain location: low back, muscles of both LEs Pain description: dull ache, sciatica pain Aggravating factors: walking with lean,  Relieving factors: exercises  PRECAUTIONS: Fall and Other: No BP RUE  WEIGHT BEARING RESTRICTIONS:  No  FALLS: Has patient fallen in last 6 months? No  LIVING ENVIRONMENT: Lives with: lives with their spouse Lives in: House Home Access: Ramped entrance Home layout: Two level, 1/2 bath on main level, and stair lift Stairs: Yes: Internal: 15 steps; on left going up and External: 2-5 steps; can reach both Has following equipment at home: Environmental consultant - 2 wheeled, Wheelchair (manual), [employer], Grab bars, and Ramped entry  PLOF: Independent with household mobility without device and Independent with community mobility without device  PATIENT GOALS:   to use prosthesis to be active including gardening, his yard has hill,   OBJECTIVE:  COGNITION: Overall cognitive status: Within functional limits for tasks assessed   SENSATION: WFL  POSTURE: rounded shoulders, forward head, flexed trunk , and weight shift right  LOWER EXTREMITY ROM:  ROM P:passive  A:active Right eval Left eval  Hip flexion    Hip extension Standing -15* Standing  -15*  Hip abduction    Hip adduction    Hip internal rotation    Hip external rotation    Knee flexion    Knee extension    Ankle dorsiflexion    Ankle plantarflexion    Ankle inversion    Ankle eversion     (Blank rows = not tested)  LOWER EXTREMITY MMT:  MMT Right eval Left eval  Hip flexion    Hip extension Functional 4/5 Functional 4/5  Hip abduction Functional 4/5 Functional 4/5  Hip adduction    Hip internal rotation    Hip external rotation    Knee flexion Functional 4/5 Functional 4/5  Knee extension Functional 4/5 Functional 4/5  Ankle dorsiflexion 3-/5 NA  Ankle plantarflexion  NA  Ankle inversion  NA  Ankle eversion  NA  (Blank rows = not tested)  TRANSFERS: Sit to stand: SBA requires use of armrests from 18" chair using back of legs against chair & RW to stabilize Stand to sit: SBA requires use of armrests from 18" chair using back of legs against chair & RW to stabilize  GAIT: Gait pattern: step to  pattern, decreased step length- Right, decreased stance time- Left, decreased hip/knee flexion- Left, decreased ankle dorsiflexion- Right, Left hip hike, Right foot flat, genu recurvatum- Left, antalgic, trendelenburg, lateral hip instability, trunk flexed, abducted- Left, and poor foot clearance- Right excessive BUE weight bearing on RW Distance walked: 100' Assistive device utilized: Environmental consultant - 2 wheeled and TTA prosthesis Level of assistance: SBA  FUNCTIONAL TESTs:  Eval / 4/30/2024Sharlene Motts Balance Scale: 11/56  [facility] PT Assessment - 02/17/23 0940       Standardized Balance Assessment   Standardized  Balance Assessment Berg Balance Test      Berg Balance Test   Sit to Stand Needs minimal aid to stand or to stabilize    Standing Unsupported Needs several tries to stand 30 seconds unsupported    Sitting with Back Unsupported but Feet Supported on Floor or Stool Able to sit safely and securely 2 minutes    Stand to Sit Uses backs of legs against chair to control descent    Transfers Able to transfer safely, definite need of hands    Standing Unsupported with Eyes Closed Needs help to keep from falling    Standing Unsupported with Feet Together Needs help to attain position and unable to hold for 15 seconds    From Standing, Reach Forward with Outstretched Arm Loses balance while trying/requires external support    From Standing Position, Pick up Object from Floor Unable to try/needs assist to keep balance    From Standing Position, Turn to Look Behind Over each Shoulder Needs assist to keep from losing balance and falling    Turn 360 Degrees Needs assistance while turning    Standing Unsupported, Alternately Place Feet on Step/Stool Needs assistance to keep from falling or unable to try    Standing Unsupported, One Foot in [employer] balance while stepping or standing    Standing on One Leg Unable to try or needs assist to prevent fall    Total Score 11    Berg comment: < 36 high risk for  falls (close to 100%) 46-51 moderate (>50%)   37-45 significant (>80%) 52-55 lower (> 25%)             CURRENT PROSTHETIC WEAR ASSESSMENT: Eval /  02/17/2023: Patient is dependent with: skin check, residual limb care, care of non-amputated limb, prosthetic cleaning, ply sock cleaning, correct ply sock adjustment, proper wear schedule/adjustment, and proper weight-bearing schedule/adjustment Donning prosthesis: SBA Doffing prosthesis: SBA Prosthetic wear tolerance: 4 hours, 1x/day, 4 of 6 days since delivery Prosthetic weight bearing tolerance: 5 minutes with limb pain 8/10 Edema: pitting with 5 sec capillary refill Residual limb condition: cylindrical shape, no open areas, limited to no hair growth, scar dryness, normal color & temperature Prosthetic description: silicon liner with pin lock suspension, total contact socket with flexible inner socket, dynamic response foot K code/activity level with prosthetic use: Level 3    TODAY'S TREATMENT:                                                                                                                             DATE:  02/17/2023:  PATIENT EDUCATION: PATIENT EDUCATED ON FOLLOWING PROSTHETIC CARE: Education details: right shoe heel cup depth & changing shoes with same heel sole differential  Skin check, Residual limb care, Prosthetic cleaning, Correct ply sock adjustment, Propper donning, and Proper wear schedule/adjustment Prosthetic wear tolerance: 3-4 hours 2x/day, 7 days/week Person educated: Patient and Spouse Education method: Explanation, Demonstration, Tactile cues, and Verbal cues Education comprehension: verbalized  understanding, verbal cues required, tactile cues required, and needs further education  HOME EXERCISE PROGRAM:  ASSESSMENT:  CLINICAL IMPRESSION: Patient is a 57 y.o. male who was seen today for physical therapy evaluation and treatment for prosthetic training with left Transtibial Amputation & right  Transmetatarsal Amputation. He is depended in prosthetic care for safe utilization of prosthesis.  He has limited wear prosthesis which limits function.  Patient has residual limb pain, right leg pain and back pain with standing and functional activities.  Berg balance test 11/56 indicates high risk of falls and dependency on standing ADLs.  Patient's gait is dependent on rolling walker for support and has significant deviations indicating high risk of falls.  Patient would benefit from skilled PT to improve function and safety with prosthetic use.  OBJECTIVE IMPAIRMENTS: Abnormal gait, decreased activity tolerance, decreased balance, decreased knowledge of condition, decreased knowledge of use of DME, decreased mobility, difficulty walking, decreased strength, increased edema, impaired flexibility, postural dysfunction, prosthetic dependency , and pain.   ACTIVITY LIMITATIONS: carrying, lifting, bending, standing, squatting, stairs, transfers, and locomotion level  PARTICIPATION LIMITATIONS: meal prep, cleaning, driving, community activity, and yard work  PERSONAL FACTORS: Education, Fitness, Past/current experiences, Time since onset of injury/illness/exacerbation, and 3+ comorbidities: see PMH  are also affecting patient's functional outcome.   REHAB POTENTIAL: Good  CLINICAL DECISION MAKING: Evolving/moderate complexity  EVALUATION COMPLEXITY: Moderate   GOALS: Goals reviewed with patient? Yes  SHORT TERM GOALS: Target date: 03/19/2023  Patient donnes prosthesis modified independent & verbalizes proper cleaning. Baseline: SEE OBJECTIVE DATA Goal status: INITIAL 2.  Patient tolerates prosthesis >12 hrs total /day without skin issues or limb pain <5/10 after standing. Baseline: SEE OBJECTIVE DATA Goal status: INITIAL  3.  Patient able to stand 30 seconds without UE support with supervision. Baseline: SEE OBJECTIVE DATA Goal status: INITIAL  4. Patient ambulates 29' with RW &  prosthesis with supervision. Baseline: SEE OBJECTIVE DATA Goal status: INITIAL  5. Patient negotiates ramps & curbs with RW & prosthesis with supervision. Baseline: SEE OBJECTIVE DATA Goal status: INITIAL  LONG TERM GOALS: Target date: 05/14/2023  Patient demonstrates & verbalized understanding of prosthetic care to enable safe utilization of prosthesis. Baseline: SEE OBJECTIVE DATA Goal status: INITIAL  Patient tolerates prosthesis wear >90% of awake hours without skin or limb pain issues. Baseline: SEE OBJECTIVE DATA Goal status: INITIAL  Berg Balance >/= 30 / 56 to indicate lower fall risk Baseline: SEE OBJECTIVE DATA Goal status: INITIAL  Patient ambulates >500' with prosthesis & LRAD independently Baseline: SEE OBJECTIVE DATA Goal status: INITIAL  Patient negotiates ramps, curbs & stairs with single rail with prosthesis & LRAD independently. Baseline: SEE OBJECTIVE DATA Goal status: INITIAL   PLAN:  PT FREQUENCY: 2x/week  PT DURATION: 12 weeks  PLANNED INTERVENTIONS: Therapeutic exercises, Therapeutic activity, Neuromuscular re-education, Balance training, Gait training, Patient/Family education, Self Care, Stair training, Prosthetic training, DME instructions, Re-evaluation, and physical performance testing  PLAN FOR NEXT SESSION: review prosthetic care, instruct in adjusting ply socks, gait training with RW including ramps & curbs.    Vladimir Faster, PT, DPT 02/17/2023, 4:25 PM

## 2023-02-24 ENCOUNTER — Ambulatory Visit (INDEPENDENT_AMBULATORY_CARE_PROVIDER_SITE_OTHER): Payer: Medicare Other | Admitting: Physical Therapy

## 2023-02-24 ENCOUNTER — Encounter: Payer: Self-pay | Admitting: Physical Therapy

## 2023-02-24 DIAGNOSIS — M79662 Pain in left lower leg: Secondary | ICD-10-CM

## 2023-02-24 DIAGNOSIS — M6281 Muscle weakness (generalized): Secondary | ICD-10-CM

## 2023-02-24 DIAGNOSIS — R2689 Other abnormalities of gait and mobility: Secondary | ICD-10-CM

## 2023-02-24 DIAGNOSIS — R2681 Unsteadiness on feet: Secondary | ICD-10-CM | POA: Diagnosis not present

## 2023-02-24 NOTE — Therapy (Signed)
OUTPATIENT PHYSICAL THERAPY PROSTHETICS TREATMENT   Patient Name: [name] MRN: [id number] DOB:28-May-1966, 57 y.o., male Today's Date: 02/24/2023  PCP: [name], MD REFERRING PROVIDER: Marcello Fennel, MD  END OF SESSION:  PT End of Session - 02/24/23 0937     Visit Number 2    Number of Visits 15    Date for PT Re-Evaluation 05/14/23    Authorization Type Medicare & BCBS    Progress Note Due on Visit 10    PT Start Time 0935    PT Stop Time 1015    PT Time Calculation (min) 40 min    Equipment Utilized During Treatment Gait belt    Activity Tolerance Patient tolerated treatment well;Patient limited by pain    Behavior During Therapy [facility] for tasks assessed/performed              Past Medical History:  Diagnosis Date   Acute osteomyelitis of right foot (HCC) 05/15/2019   Anemia 06/23/2019   Aortic stenosis    Bursitis    right shoulder   Complication of anesthesia    Coronary artery disease    Diabetes with renal manifestations(250.4) 08/23/2013   Diabetic infection of right foot (HCC)    Diabetic retinopathy associated with type 2 diabetes mellitus (HCC) 06/23/2019   ESRD on hemodialysis (HCC) 07/18/2011   Essential hypertension 10/18/2007   Qualifier: Diagnosis of  By: Everardo All MD, Gregary Signs A    History of blood transfusion    HYPERLIPIDEMIA 05/16/2008   Qualifier: Diagnosis of  By: Jonny Ruiz MD, Len Blalock    MRSA INFECTION 10/18/2007   Qualifier: Diagnosis of  By: Tyrone Apple, Lucy     Other forms of retinal detachment(361.89) 06/23/2019   PAD (peripheral artery disease) (HCC)    PONV (postoperative nausea and vomiting)    Prolonged QT interval    Pulmonary hypertension (HCC)    Secondary hyperparathyroidism (HCC) 06/23/2019   Sepsis (HCC) 05/14/2019   Past Surgical History:  Procedure Laterality Date   ABDOMINAL AORTOGRAM N/A 05/17/2019   Procedure: ABDOMINAL AORTOGRAM;  Surgeon: Sherren Kerns, MD;  Location: [facility] INVASIVE CV LAB;  Service:  Cardiovascular;  Laterality: N/A;   AMPUTATION TOE Right 05/14/2019   Procedure: PARTIAL AMPUTATION SECOND TOE RIGHT FOOT;  Surgeon: Park Liter, DPM;  Location: [facility] OR;  Service: Podiatry;  Laterality: Right;   AMPUTATION TOE Right 05/23/2019   Procedure: AMPUTATION OF SECOND TOE METATARSAL PHALANGEAL JOINT;  Surgeon: Park Liter, DPM;  Location: [facility] OR;  Service: Podiatry;  Laterality: Right;   AMPUTATION TOE Right 05/27/2019   Procedure: Amputation Toe;  Surgeon: Park Liter, DPM;  Location: [facility] OR;  Service: Podiatry;  Laterality: Right;  right third toe   APPLICATION OF WOUND VAC Right 05/27/2019   Procedure: Application Of Wound Vac;  Surgeon: Park Liter, DPM;  Location: [facility] OR;  Service: Podiatry;  Laterality: Right;   AV FISTULA PLACEMENT  06-18-11   Right brachiocephalic AVF   BONE BIOPSY Right 05/14/2019   Procedure: OPEN SUPERFICIAL BONE BIOPSY GREAT TOE;  Surgeon: Park Liter, DPM;  Location: [facility] OR;  Service: Podiatry;  Laterality: Right;   CARDIAC CATHETERIZATION     CORONARY BALLOON ANGIOPLASTY N/A 09/24/2020   Procedure: CORONARY BALLOON ANGIOPLASTY;  Surgeon: Swaziland, Peter M, MD;  Location: [facility] INVASIVE CV LAB;  Service: Cardiovascular;  Laterality: N/A;   CORONARY PRESSURE/FFR STUDY N/A 09/24/2020   Procedure: INTRAVASCULAR PRESSURE WIRE/FFR STUDY;  Surgeon: Swaziland, Peter M, MD;  Location:  [facility] CV [facility];  Service: Cardiovascular;  Laterality: N/A;   CORONARY STENT INTERVENTION N/A 09/24/2020   Procedure: CORONARY STENT INTERVENTION;  Surgeon: Swaziland, Peter M, MD;  Location: [facility] INVASIVE CV LAB;  Service: Cardiovascular;  Laterality: N/A;   EYE SURGERY     left eye for Laser, diabetic retinopathy   FISTULOGRAM Right 11/06/2011   Procedure: FISTULOGRAM;  Surgeon: Fransisco Hertz, MD;  Location: [facility] CATH LAB;  Service: Cardiovascular;  Laterality: Right;   HEMATOMA EVACUATION Right Feb. 25, 2014   HEMATOMA EVACUATION Right 12/14/2012   Procedure: EVACUATION HEMATOMA;   Surgeon: Fransisco Hertz, MD;  Location: [facility] OR;  Service: Vascular;  Laterality: Right;   INCISION AND DRAINAGE Right 05/23/2019   Procedure: INCISION AND DRAINAGE OF RIGHT FOOT DEEP SPACE ABSCESS;  Surgeon: Park Liter, DPM;  Location: [facility] OR;  Service: Podiatry;  Laterality: Right;   INJECTION OF SILICONE OIL Left 01/09/2023   Procedure: INJECTION OF SILICONE OIL;  Surgeon: Edmon Crape, MD;  Location: [facility] OR;  Service: Ophthalmology;  Laterality: Left;   INSERTION OF DIALYSIS CATHETER  12/14/2012   Procedure: INSERTION OF DIALYSIS CATHETER;  Surgeon: Fransisco Hertz, MD;  Location: [facility] OR;  Service: Vascular;;   INSERTION OF DIALYSIS CATHETER Right 01/07/2021   Procedure: INSERTION OF DIALYSIS CATHETER;  Surgeon: Sherren Kerns, MD;  Location: [facility] OR;  Service: Vascular;  Laterality: Right;   INSERTION OF DIALYSIS CATHETER Left 01/08/2021   Procedure: INSERTION OF LEFT INTERNAL JUGULAR DIALYSIS CATHETER, tunneled;  Surgeon: Chuck Hint, MD;  Location: [facility] OR;  Service: Vascular;  Laterality: Left;   IRRIGATION AND DEBRIDEMENT FOOT Right 05/25/2019   Procedure: INCISION AND DRAINAGE FOOT;  Surgeon: Park Liter, DPM;  [facility]: [facility] OR;  Service: Podiatry;  Laterality: Right;   IRRIGATION AND DEBRIDEMENT FOOT Right 05/27/2019   Procedure: IRRIGATION AND DEBRIDEMENT FOOT;  Surgeon: Park Liter, DPM;  Location: [facility] OR;  Service: Podiatry;  Laterality: Right;   LASER PHOTO ABLATION Left 01/09/2023   Procedure: LASER PHOTO ABLATION;  Surgeon: Edmon Crape, MD;  Location: [facility] OR;  Service: Ophthalmology;  Laterality: Left;   LEFT HEART CATH AND CORONARY ANGIOGRAPHY N/A 09/24/2020   Procedure: LEFT HEART CATH AND CORONARY ANGIOGRAPHY;  Surgeon: Swaziland, Peter M, MD;  Location: [facility] INVASIVE CV LAB;  Service: Cardiovascular;  Laterality: N/A;   LOWER EXTREMITY ANGIOGRAPHY Bilateral 05/17/2019   Procedure: LOWER EXTREMITY ANGIOGRAPHY;  Surgeon: Sherren Kerns, MD;  Location: [facility] INVAS[facility] CV LAB;   Service: Cardiovascular;  Laterality: Bilateral;   PARS PLANA VITRECTOMY Left 01/09/2023   Procedure: PARS PLANA VITRECTOMY WITH 25 GAUGE AND REPAIR OF RETINAL DETACHMENT;  Surgeon: Edmon Crape, MD;  Location: [facility] OR;  Service: Ophthalmology;  Laterality: Left;   REMOVAL OF A DIALYSIS CATHETER Right 01/08/2021   Procedure: REMOVAL OF TEMPORARY DIALYSIS CATHETER, LEFT FEMORAL VEIN;  Surgeon: Chuck Hint, MD;  Location: [facility] OR;  Service: Vascular;  Laterality: Right;   REVISON OF ARTERIOVENOUS FISTULA Right 12/14/2012   Procedure: REVISON OF upper arm ARTERIOVENOUS FISTULA using 59mmx10cm gortex graft;  Surgeon: Fransisco Hertz, MD;  Location: [facility] OR;  Service: Vascular;  Laterality: Right;   REVISON OF ARTERIOVENOUS FISTULA Right 12/27/2020   Procedure: RIGHT UPPER EXTREMITY ARTERIOVENOUS FISTULA REVISiON;  Surgeon: Leonie Douglas, MD;  Location: [facility] OR;  Service: Vascular;  Laterality: Right;  PERIPHERAL NERVE BLOCK   RIGHT/LEFT HEART CATH AND CORONARY ANGIOGRAPHY N/A 08/07/2020   Procedure: RIGHT/LEFT HEART CATH  AND CORONARY ANGIOGRAPHY;  Surgeon: Swaziland, Peter M, MD;  Location: [facility] INVASIVE CV LAB;  Service: Cardiovascular;  Laterality: N/A;   SOFT TISSUE MASS EXCISION     Right arm, Left leg  for MRSA infection   Patient Active Problem List   Diagnosis Date Noted   ACS (acute coronary syndrome) (HCC) 09/12/2021   Prolonged QT interval 09/12/2021   Unstable angina (HCC) 08/20/2021   Nuclear sclerotic cataract of right eye 06/11/2021   Nuclear sclerotic cataract of left eye 06/11/2021   Posterior subcapsular age-related cataract, left eye 06/11/2021   Stable treated proliferative diabetic retinopathy of left eye determined by examination associated with type 2 diabetes mellitus (HCC) 06/11/2021   Right epiretinal membrane 06/11/2021   Stable treated proliferative diabetic retinopathy of right eye determined by examination associated with type 2 diabetes mellitus (HCC) 06/11/2021    Hyperkalemia 01/09/2021   ESRD (end stage renal disease) (HCC)    Dialysis AV fistula malfunction, initial encounter (HCC) 01/07/2021   NSTEMI (non-ST elevated myocardial infarction) (HCC) 09/21/2020   3-vessel coronary artery disease 08/09/2020   Pulmonary hypertension, unspecified (HCC) 08/09/2020   Pancytopenia (HCC) 08/06/2020   Allergy, unspecified, subsequent encounter 06/28/2020   Anaphylactic shock, unspecified, subsequent encounter 06/28/2020   Lymphedema of both lower extremities 12/29/2019   Hyperphosphatemia 12/11/2019   Encounter for post surgical wound check 08/16/2019   Post-op pain 08/16/2019   S/P transmetatarsal amputation of foot, right (HCC) 08/16/2019   Unspecified protein-calorie malnutrition (HCC) 07/12/2019   PAD (peripheral artery disease) (HCC) 06/30/2019   Diabetic retinopathy associated with type 2 diabetes mellitus (HCC) 06/23/2019   Other forms of retinal detachment(361.89) 06/23/2019   Secondary hyperparathyroidism (HCC) 06/23/2019   Osteomyelitis, unspecified (HCC) 05/31/2019   Gangrene of toe of right foot (HCC)    Ulcer of right foot, with necrosis of bone (HCC)    Abscess of right foot    Diabetic infection of right foot (HCC)    Ulcer of right second toe with necrosis of bone (HCC)    Cellulitis of right foot 05/22/2019   Acute osteomyelitis of right foot (HCC) 05/15/2019   Osteomyelitis of great toe of right foot (HCC) 05/14/2019   Diabetic foot ulcer- right second toe 05/14/2019   Sepsis (HCC) 05/14/2019   Hypothyroidism, unspecified 01/11/2018   Hypercalcemia 06/14/2016   Dyslipidemia 05/11/2015   Hyperglycemia 05/05/2015   Diarrhea, unspecified 02/01/2015   Fever, unspecified 02/01/2015   Pain, unspecified 02/01/2015   Pruritus, unspecified 02/01/2015   Coagulation defect, unspecified (HCC) 07/07/2014   Encounter for removal of sutures 05/30/2014   Encounter for immunization 04/19/2014   Chest pain 08/23/2013   Type 2 diabetes mellitus  (HCC) 08/23/2013   Hemodialysis status (HCC) 08/23/2013   Chronic kidney disease, stage V (HCC) 08/23/2013   Obesity, unspecified 08/23/2013   Complication of vascular dialysis catheter 12/15/2012   Other fluid overload 12/07/2012   Other specified complication of vascular prosthetic devices, implants and grafts, subsequent encounter 08/06/2012   ESRD on dialysis (HCC) 07/18/2011   Pure hypercholesterolemia 06/23/2011   Iron deficiency anemia, unspecified 06/21/2011   HYPOGLYCEMIA, UNSPECIFIED 11/13/2008   RENAL INSUFFICIENCY 07/31/2008   FOOT ULCER, LEFT 06/29/2008   HYPERLIPIDEMIA 05/16/2008   MRSA INFECTION 10/18/2007   Essential hypertension 10/18/2007    ONSET DATE: 02/12/2023 prosthesis delivery  REFERRING DIAG: 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 Except he hurt his shoulder unable to get out of w/c so did not wear it for 2 days for fear of falling.  Pt accompanied by: significant other  PERTINENT HISTORY: left BKA, right TMA, DM2, PAD, CAD, MI, ESRD on HD, moderate AS, HTN, hypothyroidism, anemia, CHF  PAIN:  Are you having pain?   Yes: NPRS scale: this morning 0/10 Pain location: low back, muscles of both LEs Pain description: dull ache, sciatica pain Aggravating factors: walking with lean,  Relieving factors: exercises  PRECAUTIONS: Fall and Other: No BP RUE  WEIGHT BEARING RESTRICTIONS: No  FALLS: Has patient fallen in last 6 months? No  LIVING ENVIRONMENT: Lives with: lives with their spouse Lives in: House Home Access: Ramped entrance Home layout: Two level, 1/2 bath on main  level, and stair lift Stairs: Yes: Internal: 15 steps; on left going up and External: 2-5 steps; can reach both Has following equipment at home: Environmental consultant - 2 wheeled, Wheelchair (manual), [employer], Grab bars, and Ramped entry  PLOF: Independent with household mobility without device and Independent with community mobility without device  PATIENT GOALS:   to use prosthesis to be active including gardening, his yard has hill,   OBJECTIVE:  COGNITION: Overall cognitive status: Within functional limits for tasks assessed   SENSATION: WFL  POSTURE:  Eval / 02/17/2023:  rounded shoulders, forward head, flexed trunk , and weight shift right  LOWER EXTREMITY ROM:  ROM P:passive  A:active Right eval Left eval  Hip flexion    Hip extension Standing -15* Standing  -15*  Hip abduction    Hip adduction    Hip internal rotation    Hip external rotation    Knee flexion    Knee extension    Ankle dorsiflexion    Ankle plantarflexion    Ankle inversion    Ankle eversion     (Blank rows = not tested)  LOWER EXTREMITY MMT:  MMT Right eval Left eval  Hip flexion    Hip extension Functional 4/5 Functional 4/5  Hip abduction Functional 4/5 Functional 4/5  Hip adduction    Hip internal rotation    Hip external rotation    Knee flexion Functional 4/5 Functional 4/5  Knee extension Functional 4/5 Functional 4/5  Ankle dorsiflexion 3-/5 NA  Ankle plantarflexion  NA  Ankle inversion  NA  Ankle eversion  NA  (Blank rows = not tested)  TRANSFERS: Eval / 02/17/2023: Sit to stand: SBA requires use of armrests from 18" chair using back of legs against chair & RW to stabilize Stand to sit: SBA requires use of armrests from 18" chair using back of legs against chair & RW to stabilize  GAIT: Eval / 02/17/2023: Gait pattern: step to pattern, decreased step length- Right, decreased stance time- Left, decreased hip/knee flexion- Left, decreased ankle dorsiflexion- Right, Left hip  hike, Right foot flat, genu recurvatum- Left, antalgic, trendelenburg, lateral hip instability, trunk flexed, abducted- Left, and poor foot clearance- Right excessive BUE weight bearing on RW Distance walked: 100' Assistive device utilized: Environmental consultant - 2 wheeled and TTA prosthesis Level of assistance: SBA  FUNCTIONAL TESTs:  Eval / 4/30/2024Sharlene Motts Balance Scale: 11/56    CURRENT PROSTHETIC WEAR ASSESSMENT: Eval /  02/17/2023: Patient is dependent with: skin check, residual limb care, care of non-amputated limb, prosthetic cleaning,  ply sock cleaning, correct ply sock adjustment, proper wear schedule/adjustment, and proper weight-bearing schedule/adjustment Donning prosthesis: SBA Doffing prosthesis: SBA Prosthetic wear tolerance: 4 hours, 1x/day, 4 of 6 days since delivery Prosthetic weight bearing tolerance: 5 minutes with limb pain 8/10 Edema: pitting with 5 sec capillary refill Residual limb condition: cylindrical shape, no open areas, limited to no hair growth, scar dryness, normal color & temperature Prosthetic description: silicon liner with pin lock suspension, total contact socket with flexible inner socket, dynamic response foot K code/activity level with prosthetic use: Level 3    TODAY'S TREATMENT:                                                                                                                             DATE:  02/24/2023: Prosthetic Training with Transtibial Amputation: PT demo & verbal cues on adjusting ply socks including use of cutoff socks. Pt donne, amb 20' and patella depth to socket as guidance points. PT had pt perform with too few, too many & correct ply for limb volume today.  PT instructed that limb will shrink over time starting mornings with more ply and during day weight bearing pumps fluid requiring to add more ply socks.  Next day he may be back to same or only slightly more than this morning.  Dialysis effects limb volume / fluid retention. Pt  verbalized better understanding.  PT verbal & demo cues on neg ramp & curb with TTA prosthesis & RW. Pt performed with RW with supervision.    02/17/2023:  PATIENT EDUCATION: PATIENT EDUCATED ON FOLLOWING PROSTHETIC CARE: Education details: right shoe heel cup depth & changing shoes with same heel sole differential  Skin check, Residual limb care, Prosthetic cleaning, Correct ply sock adjustment, Propper donning, and Proper wear schedule/adjustment Prosthetic wear tolerance: 3-4 hours 2x/day, 7 days/week Person educated: Patient and Spouse Education method: Explanation, Demonstration, Tactile cues, and Verbal cues Education comprehension: verbalized understanding, verbal cues required, tactile cues required, and needs further education  HOME EXERCISE PROGRAM:  ASSESSMENT:  CLINICAL IMPRESSION: Patient appears to have better concept for adjusting ply socks with limb volume changes.  He also appears to understand how to neg ramps & curbs with RW & TTA prosthesis.   OBJECTIVE IMPAIRMENTS: Abnormal gait, decreased activity tolerance, decreased balance, decreased knowledge of condition, decreased knowledge of use of DME, decreased mobility, difficulty walking, decreased strength, increased edema, impaired flexibility, postural dysfunction, prosthetic dependency , and pain.   ACTIVITY LIMITATIONS: carrying, lifting, bending, standing, squatting, stairs, transfers, and locomotion level  PARTICIPATION LIMITATIONS: meal prep, cleaning, driving, community activity, and yard work  PERSONAL FACTORS: Education, Fitness, Past/current experiences, Time since onset of injury/illness/exacerbation, and 3+ comorbidities: see PMH  are also affecting patient's functional outcome.   REHAB POTENTIAL: Good  CLINICAL DECISION MAKING: Evolving/moderate complexity  EVALUATION COMPLEXITY: Moderate   GOALS: Goals reviewed with patient? Yes  SHORT TERM GOALS: Target date: 03/19/2023  Patient donnes  prosthesis modified independent & verbalizes  proper cleaning. Baseline: SEE OBJECTIVE DATA Goal status:  Ongoing 02/24/2023 2.  Patient tolerates prosthesis >12 hrs total /day without skin issues or limb pain <5/10 after standing. Baseline: SEE OBJECTIVE DATA Goal status: Ongoing 02/24/2023  3.  Patient able to stand 30 seconds without UE support with supervision. Baseline: SEE OBJECTIVE DATA Goal status: Ongoing 02/24/2023  4. Patient ambulates 70' with RW & prosthesis with supervision. Baseline: SEE OBJECTIVE DATA Goal status: Ongoing 02/24/2023  5. Patient negotiates ramps & curbs with RW & prosthesis with supervision. Baseline: SEE OBJECTIVE DATA Goal status: Ongoing 02/24/2023  LONG TERM GOALS: Target date: 05/14/2023  Patient demonstrates & verbalized understanding of prosthetic care to enable safe utilization of prosthesis. Baseline: SEE OBJECTIVE DATA Goal status: Ongoing 02/24/2023  Patient tolerates prosthesis wear >90% of awake hours without skin or limb pain issues. Baseline: SEE OBJECTIVE DATA Goal status: Ongoing 02/24/2023  Berg Balance >/= 30 / 56 to indicate lower fall risk Baseline: SEE OBJECTIVE DATA Goal status: Ongoing 02/24/2023  Patient ambulates >500' with prosthesis & LRAD independently Baseline: SEE OBJECTIVE DATA Goal status: Ongoing 02/24/2023  Patient negotiates ramps, curbs & stairs with single rail with prosthesis & LRAD independently. Baseline: SEE OBJECTIVE DATA Goal status: Ongoing 02/24/2023   PLAN:  PT FREQUENCY: 2x/week  PT DURATION: 12 weeks  PLANNED INTERVENTIONS: Therapeutic exercises, Therapeutic activity, Neuromuscular re-education, Balance training, Gait training, Patient/Family education, Self Care, Stair training, Prosthetic training, DME instructions, Re-evaluation, and physical performance testing  PLAN FOR NEXT SESSION: continue gait training with RW including ramps & curbs. Instruct in stairs. Standing balance activities.    Vladimir Faster, PT, DPT 02/24/2023, 1:55 PM

## 2023-02-26 ENCOUNTER — Ambulatory Visit (INDEPENDENT_AMBULATORY_CARE_PROVIDER_SITE_OTHER): Payer: Medicare Other | Admitting: Physical Therapy

## 2023-02-26 ENCOUNTER — Encounter: Payer: Self-pay | Admitting: Physical Therapy

## 2023-02-26 DIAGNOSIS — M6281 Muscle weakness (generalized): Secondary | ICD-10-CM

## 2023-02-26 DIAGNOSIS — R2681 Unsteadiness on feet: Secondary | ICD-10-CM

## 2023-02-26 DIAGNOSIS — R2689 Other abnormalities of gait and mobility: Secondary | ICD-10-CM | POA: Diagnosis not present

## 2023-02-26 NOTE — Therapy (Signed)
OUTPATIENT PHYSICAL THERAPY PROSTHETICS TREATMENT   Patient Name: [name] MRN: [id number] DOB:03/09/1966, 57 y.o., male Today's Date: 02/26/2023  PCP: [name], MD REFERRING PROVIDER: Marcello Fennel, MD  END OF SESSION:  PT End of Session - 02/26/23 1146     Visit Number 3    Number of Visits 15    Date for PT Re-Evaluation 05/14/23    Authorization Type Medicare & BCBS    Progress Note Due on Visit 10    PT Start Time 1145    PT Stop Time 1228    PT Time Calculation (min) 43 min    Equipment Utilized During Treatment Gait belt    Activity Tolerance Patient tolerated treatment well;Patient limited by pain    Behavior During Therapy [facility] for tasks assessed/performed               Past Medical History:  Diagnosis Date   Acute osteomyelitis of right foot (HCC) 05/15/2019   Anemia 06/23/2019   Aortic stenosis    Bursitis    right shoulder   Complication of anesthesia    Coronary artery disease    Diabetes with renal manifestations(250.4) 08/23/2013   Diabetic infection of right foot (HCC)    Diabetic retinopathy associated with type 2 diabetes mellitus (HCC) 06/23/2019   ESRD on hemodialysis (HCC) 07/18/2011   Essential hypertension 10/18/2007   Qualifier: Diagnosis of  By: Everardo All MD, Gregary Signs A    History of blood transfusion    HYPERLIPIDEMIA 05/16/2008   Qualifier: Diagnosis of  By: Jonny Ruiz MD, Len Blalock    MRSA INFECTION 10/18/2007   Qualifier: Diagnosis of  By: Tyrone Apple, Lucy     Other forms of retinal detachment(361.89) 06/23/2019   PAD (peripheral artery disease) (HCC)    PONV (postoperative nausea and vomiting)    Prolonged QT interval    Pulmonary hypertension (HCC)    Secondary hyperparathyroidism (HCC) 06/23/2019   Sepsis (HCC) 05/14/2019   Past Surgical History:  Procedure Laterality Date   ABDOMINAL AORTOGRAM N/A 05/17/2019   Procedure: ABDOMINAL AORTOGRAM;  Surgeon: Sherren Kerns, MD;  Location: [facility] INVASIVE CV LAB;  Service:  Cardiovascular;  Laterality: N/A;   AMPUTATION TOE Right 05/14/2019   Procedure: PARTIAL AMPUTATION SECOND TOE RIGHT FOOT;  Surgeon: Park Liter, DPM;  Location: [facility] OR;  Service: Podiatry;  Laterality: Right;   AMPUTATION TOE Right 05/23/2019   Procedure: AMPUTATION OF SECOND TOE METATARSAL PHALANGEAL JOINT;  Surgeon: Park Liter, DPM;  Location: [facility] OR;  Service: Podiatry;  Laterality: Right;   AMPUTATION TOE Right 05/27/2019   Procedure: Amputation Toe;  Surgeon: Park Liter, DPM;  Location: [facility] OR;  Service: Podiatry;  Laterality: Right;  right third toe   APPLICATION OF WOUND VAC Right 05/27/2019   Procedure: Application Of Wound Vac;  Surgeon: Park Liter, DPM;  Location: [facility] OR;  Service: Podiatry;  Laterality: Right;   AV FISTULA PLACEMENT  06-18-11   Right brachiocephalic AVF   BONE BIOPSY Right 05/14/2019   Procedure: OPEN SUPERFICIAL BONE BIOPSY GREAT TOE;  Surgeon: Park Liter, DPM;  Location: [facility] OR;  Service: Podiatry;  Laterality: Right;   CARDIAC CATHETERIZATION     CORONARY BALLOON ANGIOPLASTY N/A 09/24/2020   Procedure: CORONARY BALLOON ANGIOPLASTY;  Surgeon: Swaziland, Peter M, MD;  Location: [facility] INVASIVE CV LAB;  Service: Cardiovascular;  Laterality: N/A;   CORONARY PRESSURE/FFR STUDY N/A 09/24/2020   Procedure: INTRAVASCULAR PRESSURE WIRE/FFR STUDY;  Surgeon: Swaziland, Peter M, MD;  Location: [facility];  Service: Cardiovascular;  Laterality: N/A;   CORONARY STENT INTERVENTION N/A 09/24/2020   Procedure: CORONARY STENT INTERVENTION;  Surgeon: Swaziland, Peter M, MD;  Location: [facility] INVASIVE CV LAB;  Service: Cardiovascular;  Laterality: N/A;   EYE SURGERY     left eye for Laser, diabetic retinopathy   FISTULOGRAM Right 11/06/2011   Procedure: FISTULOGRAM;  Surgeon: Fransisco Hertz, MD;  Location: [facility] CATH LAB;  Service: Cardiovascular;  Laterality: Right;   HEMATOMA EVACUATION Right Feb. 25, 2014   HEMATOMA EVACUATION Right 12/14/2012   Procedure: EVACUATION HEMATOMA;   Surgeon: Fransisco Hertz, MD;  Location: [name] OR;  Service: Vascular;  Laterality: Right;   INCISION AND DRAINAGE Right 05/23/2019   Procedure: INCISION AND DRAINAGE OF RIGHT FOOT DEEP SPACE ABSCESS;  Surgeon: Park Liter, DPM;  Location: [facility] OR;  Service: Podiatry;  Laterality: Right;   INJECTION OF SILICONE OIL Left 01/09/2023   Procedure: INJECTION OF SILICONE OIL;  Surgeon: Edmon Crape, MD;  Location: [facility] OR;  Service: Ophthalmology;  Laterality: Left;   INSERTION OF DIALYSIS CATHETER  12/14/2012   Procedure: INSERTION OF DIALYSIS CATHETER;  Surgeon: Fransisco Hertz, MD;  Location: [facility] OR;  Service: Vascular;;   INSERTION OF DIALYSIS CATHETER Right 01/07/2021   Procedure: INSERTION OF DIALYSIS CATHETER;  Surgeon: Sherren Kerns, MD;  Location: [facility] OR;  Service: Vascular;  Laterality: Right;   INSERTION OF DIALYSIS CATHETER Left 01/08/2021   Procedure: INSERTION OF LEFT INTERNAL JUGULAR DIALYSIS CATHETER, tunneled;  Surgeon: Chuck Hint, MD;  Location: [facility] OR;  Service: Vascular;  Laterality: Left;   IRRIGATION AND DEBRIDEMENT FOOT Right 05/25/2019   Procedure: INCISION AND DRAINAGE FOOT;  Surgeon: Park Liter, DPM;  Location: [facility] OR;  Service: Podiatry;  Laterality: Right;   IRRIGATION AND DEBRIDEMENT FOOT Right 05/27/2019   Procedure: IRRIGATION AND DEBRIDEMENT FOOT;  Surgeon: Park Liter, DPM;  Location: [facility] OR;  Service: Podiatry;  Laterality: Right;   LASER PHOTO ABLATION Left 01/09/2023   Procedure: LASER PHOTO ABLATION;  Surgeon: Edmon Crape, MD;  Location: [facility] OR;  Service: Ophthalmology;  Laterality: Left;   LEFT HEART CATH AND CORONARY ANGIOGRAPHY N/A 09/24/2020   Procedure: LEFT HEART CATH AND CORONARY ANGIOGRAPHY;  Surgeon: Swaziland, Peter M, MD;  Location: [facility] INVASIVE CV LAB;  Service: Cardiovascular;  Laterality: N/A;   LOWER EXTREMITY ANGIOGRAPHY Bilateral 05/17/2019   Procedure: LOWER EXTREMITY ANGIOGRAPHY;  Surgeon: Sherren Kerns, MD;  Location: [facility] INVASIVE CV LAB;   Service: Cardiovascular;  Laterality: Bilateral;   PARS PLANA VITRECTOMY Left 01/09/2023   Procedure: PARS PLANA VITRECTOMY WITH 25 GAUGE AND REPAIR OF RETINAL DETACHMENT;  Surgeon: Edmon Crape, MD;  Location: [facility] OR;  Service: Ophthalmology;  Laterality: Left;   REMOVAL OF A DIALYSIS CATHETER Right 01/08/2021   Procedure: REMOVAL OF TEMPORARY DIALYSIS CATHETER, LEFT FEMORAL VEIN;  Surgeon: Chuck Hint, MD;  Location: [facility] OR;  Service: Vascular;  Laterality: Right;   REVISON OF ARTERIOVENOUS FISTULA Right 12/14/2012   Procedure: REVISON OF upper arm ARTERIOVENOUS FISTULA using 7mmx10cm gortex graft;  Surgeon: Fransisco Hertz, MD;  Location: [facility] OR;  Service: Vascular;  Laterality: Right;   REVISON OF ARTERIOVENOUS FISTULA Right 12/27/2020   Procedure: RIGHT UPPER EXTREMITY ARTERIOVENOUS FISTULA REVISiON;  Surgeon: Leonie Douglas, MD;  Location: [facility] OR;  Service: Vascular;  Laterality: Right;  PERIPHERAL NERVE BLOCK   RIGHT/LEFT HEART CATH AND CORONARY ANGIOGRAPHY N/A 08/07/2020   Procedure: RIGHT/LEFT HEART  CATH AND CORONARY ANGIOGRAPHY;  Surgeon: Swaziland, Peter M, MD;  Location: [facility] INVASIVE CV LAB;  Service: Cardiovascular;  Laterality: N/A;   SOFT TISSUE MASS EXCISION     Right arm, Left leg  for MRSA infection   Patient Active Problem List   Diagnosis Date Noted   ACS (acute coronary syndrome) (HCC) 09/12/2021   Prolonged QT interval 09/12/2021   Unstable angina (HCC) 08/20/2021   Nuclear sclerotic cataract of right eye 06/11/2021   Nuclear sclerotic cataract of left eye 06/11/2021   Posterior subcapsular age-related cataract, left eye 06/11/2021   Stable treated proliferative diabetic retinopathy of left eye determined by examination associated with type 2 diabetes mellitus (HCC) 06/11/2021   Right epiretinal membrane 06/11/2021   Stable treated proliferative diabetic retinopathy of right eye determined by examination associated with type 2 diabetes mellitus (HCC) 06/11/2021    Hyperkalemia 01/09/2021   ESRD (end stage renal disease) (HCC)    Dialysis AV fistula malfunction, initial encounter (HCC) 01/07/2021   NSTEMI (non-ST elevated myocardial infarction) (HCC) 09/21/2020   3-vessel coronary artery disease 08/09/2020   Pulmonary hypertension, unspecified (HCC) 08/09/2020   Pancytopenia (HCC) 08/06/2020   Allergy, unspecified, subsequent encounter 06/28/2020   Anaphylactic shock, unspecified, subsequent encounter 06/28/2020   Lymphedema of both lower extremities 12/29/2019   Hyperphosphatemia 12/11/2019   Encounter for post surgical wound check 08/16/2019   Post-op pain 08/16/2019   S/P transmetatarsal amputation of foot, right (HCC) 08/16/2019   Unspecified protein-calorie malnutrition (HCC) 07/12/2019   PAD (peripheral artery disease) (HCC) 06/30/2019   Diabetic retinopathy associated with type 2 diabetes mellitus (HCC) 06/23/2019   Other forms of retinal detachment(361.89) 06/23/2019   Secondary hyperparathyroidism (HCC) 06/23/2019   Osteomyelitis, unspecified (HCC) 05/31/2019   Gangrene of toe of right foot (HCC)    Ulcer of right foot, with necrosis of bone (HCC)    Abscess of right foot    Diabetic infection of right foot (HCC)    Ulcer of right second toe with necrosis of bone (HCC)    Cellulitis of right foot 05/22/2019   Acute osteomyelitis of right foot (HCC) 05/15/2019   Osteomyelitis of great toe of right foot (HCC) 05/14/2019   Diabetic foot ulcer- right second toe 05/14/2019   Sepsis (HCC) 05/14/2019   Hypothyroidism, unspecified 01/11/2018   Hypercalcemia 06/14/2016   Dyslipidemia 05/11/2015   Hyperglycemia 05/05/2015   Diarrhea, unspecified 02/01/2015   Fever, unspecified 02/01/2015   Pain, unspecified 02/01/2015   Pruritus, unspecified 02/01/2015   Coagulation defect, unspecified (HCC) 07/07/2014   Encounter for removal of sutures 05/30/2014   Encounter for immunization 04/19/2014   Chest pain 08/23/2013   Type 2 diabetes mellitus  (HCC) 08/23/2013   Hemodialysis status (HCC) 08/23/2013   Chronic kidney disease, stage V (HCC) 08/23/2013   Obesity, unspecified 08/23/2013   Complication of vascular dialysis catheter 12/15/2012   Other fluid overload 12/07/2012   Other specified complication of vascular prosthetic devices, implants and grafts, subsequent encounter 08/06/2012   ESRD on dialysis (HCC) 07/18/2011   Pure hypercholesterolemia 06/23/2011   Iron deficiency anemia, unspecified 06/21/2011   HYPOGLYCEMIA, UNSPECIFIED 11/13/2008   RENAL INSUFFICIENCY 07/31/2008   FOOT ULCER, LEFT 06/29/2008   HYPERLIPIDEMIA 05/16/2008   MRSA INFECTION 10/18/2007   Essential hypertension 10/18/2007    ONSET DATE: 02/12/2023 prosthesis delivery  REFERRING DIAG: 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  He forgot them this morning.  Pt accompanied by: significant other  PERTINENT HISTORY: left BKA, right TMA, DM2, PAD, CAD, MI, ESRD on HD, moderate AS, HTN, hypothyroidism, anemia, CHF  PAIN:  Are you having pain?   Yes: NPRS scale: this morning 0/10 Pain location: low back, muscles of both LEs Pain description: dull ache, sciatica pain Aggravating factors: walking with lean,  Relieving factors: exercises  PRECAUTIONS: Fall and Other: No BP RUE  WEIGHT BEARING RESTRICTIONS: No  FALLS: Has patient fallen in last 6 months? No  LIVING ENVIRONMENT: Lives with: lives with their spouse Lives in: House Home Access: Ramped entrance Home layout: Two level, 1/2 bath on main level, and stair lift Stairs: Yes: Internal: 15 steps; on left going up and External: 2-5 steps; can reach  both Has following equipment at home: Environmental consultant - 2 wheeled, Wheelchair (manual), [employer], Grab bars, and Ramped entry  PLOF: Independent with household mobility without device and Independent with community mobility without device  PATIENT GOALS:   to use prosthesis to be active including gardening, his yard has hill,   OBJECTIVE:  COGNITION: Overall cognitive status: Within functional limits for tasks assessed   SENSATION: WFL  POSTURE:  Eval / 02/17/2023:  rounded shoulders, forward head, flexed trunk , and weight shift right  LOWER EXTREMITY ROM:  ROM P:passive  A:active Right eval Left eval  Hip flexion    Hip extension Standing -15* Standing  -15*  Hip abduction    Hip adduction    Hip internal rotation    Hip external rotation    Knee flexion    Knee extension    Ankle dorsiflexion    Ankle plantarflexion    Ankle inversion    Ankle eversion     (Blank rows = not tested)  LOWER EXTREMITY MMT:  MMT Right eval Left eval  Hip flexion    Hip extension Functional 4/5 Functional 4/5  Hip abduction Functional 4/5 Functional 4/5  Hip adduction    Hip internal rotation    Hip external rotation    Knee flexion Functional 4/5 Functional 4/5  Knee extension Functional 4/5 Functional 4/5  Ankle dorsiflexion 3-/5 NA  Ankle plantarflexion  NA  Ankle inversion  NA  Ankle eversion  NA  (Blank rows = not tested)  TRANSFERS: Eval / 02/17/2023: Sit to stand: SBA requires use of armrests from 18" chair using back of legs against chair & RW to stabilize Stand to sit: SBA requires use of armrests from 18" chair using back of legs against chair & RW to stabilize  GAIT: Eval / 02/17/2023: Gait pattern: step to pattern, decreased step length- Right, decreased stance time- Left, decreased hip/knee flexion- Left, decreased ankle dorsiflexion- Right, Left hip hike, Right foot flat, genu recurvatum- Left, antalgic, trendelenburg, lateral hip instability, trunk flexed,  abducted- Left, and poor foot clearance- Right excessive BUE weight bearing on RW Distance walked: 100' Assistive device utilized: Environmental consultant - 2 wheeled and TTA prosthesis Level of assistance: SBA  FUNCTIONAL TESTs:  Eval / 4/30/2024Sharlene Motts Balance Scale: 11/56    CURRENT PROSTHETIC WEAR ASSESSMENT: Eval /  02/17/2023: Patient is dependent with: skin check, residual limb care, care of non-amputated limb, prosthetic cleaning, ply sock cleaning, correct ply sock adjustment, proper wear schedule/adjustment, and proper weight-bearing schedule/adjustment Donning prosthesis: SBA Doffing prosthesis: SBA Prosthetic wear tolerance:  4 hours, 1x/day, 4 of 6 days since delivery Prosthetic weight bearing tolerance: 5 minutes with limb pain 8/10 Edema: pitting with 5 sec capillary refill Residual limb condition: cylindrical shape, no open areas, limited to no hair growth, scar dryness, normal color & temperature Prosthetic description: silicon liner with pin lock suspension, total contact socket with flexible inner socket, dynamic response foot K code/activity level with prosthetic use: Level 3    TODAY'S TREATMENT:                                                                                                                             DATE:  02/26/2023: Prosthetic Training with Transtibial Amputation: PT instructed with demo how to properly correct rotation & patella in relief to determine rotation.  PT instructed in signs of sweating and how to dry limb/liner. PT instructed in rationale for increasing wear and fall risk when prosthesis is off. Increase wear to all awake hours except 2-3 hours midday. Dry limb & liner mid time of morning & evening wear.  Pt & his wife verbalized understanding of all of above prosthetic care instruction.  PT instructed with demo & verbal cues on prosthetic gait with cane. Pt amb 100' with cane stand alone tip & HHA modA with noted knee instability.      02/24/2023: Prosthetic Training with Transtibial Amputation: PT demo & verbal cues on adjusting ply socks including use of cutoff socks. Pt donne, amb 20' and patella depth to socket as guidance points. PT had pt perform with too few, too many & correct ply for limb volume today.  PT instructed that limb will shrink over time starting mornings with more ply and during day weight bearing pumps fluid requiring to add more ply socks.  Next day he may be back to same or only slightly more than this morning.  Dialysis effects limb volume / fluid retention. Pt verbalized better understanding.  PT verbal & demo cues on neg ramp & curb with TTA prosthesis & RW. Pt performed with RW with supervision.    02/17/2023:  PATIENT EDUCATION: PATIENT EDUCATED ON FOLLOWING PROSTHETIC CARE: Education details: right shoe heel cup depth & changing shoes with same heel sole differential  Skin check, Residual limb care, Prosthetic cleaning, Correct ply sock adjustment, Propper donning, and Proper wear schedule/adjustment Prosthetic wear tolerance: 3-4 hours 2x/day, 7 days/week Person educated: Patient and Spouse Education method: Explanation, Demonstration, Tactile cues, and Verbal cues Education comprehension: verbalized understanding, verbal cues required, tactile cues required, and needs further education  HOME EXERCISE PROGRAM:  ASSESSMENT: CLINICAL IMPRESSION: Patient appears to have understanding of prosthetic care instructions given today (see above).  PT introduced gait with cane but needs more work prior to using outside of PT.   OBJECTIVE IMPAIRMENTS: Abnormal gait, decreased activity tolerance, decreased balance, decreased knowledge of condition, decreased knowledge of use of DME, decreased mobility, difficulty walking, decreased strength, increased edema, impaired flexibility, postural dysfunction, prosthetic dependency , and pain.  ACTIVITY LIMITATIONS: carrying, lifting, bending, standing,  squatting, stairs, transfers, and locomotion level  PARTICIPATION LIMITATIONS: meal prep, cleaning, driving, community activity, and yard work  PERSONAL FACTORS: Education, Fitness, Past/current experiences, Time since onset of injury/illness/exacerbation, and 3+ comorbidities: see PMH  are also affecting patient's functional outcome.   REHAB POTENTIAL: Good  CLINICAL DECISION MAKING: Evolving/moderate complexity  EVALUATION COMPLEXITY: Moderate   GOALS: Goals reviewed with patient? Yes  SHORT TERM GOALS: Target date: 03/19/2023  Patient donnes prosthesis modified independent & verbalizes proper cleaning. Baseline: SEE OBJECTIVE DATA Goal status:  Ongoing 02/24/2023 2.  Patient tolerates prosthesis >12 hrs total /day without skin issues or limb pain <5/10 after standing. Baseline: SEE OBJECTIVE DATA Goal status: Ongoing 02/24/2023  3.  Patient able to stand 30 seconds without UE support with supervision. Baseline: SEE OBJECTIVE DATA Goal status: Ongoing 02/24/2023  4. Patient ambulates 53' with RW & prosthesis with supervision. Baseline: SEE OBJECTIVE DATA Goal status: Ongoing 02/24/2023  5. Patient negotiates ramps & curbs with RW & prosthesis with supervision. Baseline: SEE OBJECTIVE DATA Goal status: Ongoing 02/24/2023  LONG TERM GOALS: Target date: 05/14/2023  Patient demonstrates & verbalized understanding of prosthetic care to enable safe utilization of prosthesis. Baseline: SEE OBJECTIVE DATA Goal status: Ongoing 02/24/2023  Patient tolerates prosthesis wear >90% of awake hours without skin or limb pain issues. Baseline: SEE OBJECTIVE DATA Goal status: Ongoing 02/24/2023  Berg Balance >/= 30 / 56 to indicate lower fall risk Baseline: SEE OBJECTIVE DATA Goal status: Ongoing 02/24/2023  Patient ambulates >500' with prosthesis & LRAD independently Baseline: SEE OBJECTIVE DATA Goal status: Ongoing 02/24/2023  Patient negotiates ramps, curbs & stairs with single rail with  prosthesis & LRAD independently. Baseline: SEE OBJECTIVE DATA Goal status: Ongoing 02/24/2023   PLAN:  PT FREQUENCY: 2x/week  PT DURATION: 12 weeks  PLANNED INTERVENTIONS: Therapeutic exercises, Therapeutic activity, Neuromuscular re-education, Balance training, Gait training, Patient/Family education, Self Care, Stair training, Prosthetic training, DME instructions, Re-evaluation, and physical performance testing  PLAN FOR NEXT SESSION:  continue gait training with cane including ramps & curbs. Instruct in stairs. Standing balance activities.    Vladimir Faster, PT, DPT 02/26/2023, 12:44 PM

## 2023-03-03 ENCOUNTER — Encounter: Payer: Self-pay | Admitting: Physical Therapy

## 2023-03-03 ENCOUNTER — Ambulatory Visit (INDEPENDENT_AMBULATORY_CARE_PROVIDER_SITE_OTHER): Payer: Medicare Other | Admitting: Physical Therapy

## 2023-03-03 DIAGNOSIS — R2689 Other abnormalities of gait and mobility: Secondary | ICD-10-CM | POA: Diagnosis not present

## 2023-03-03 DIAGNOSIS — M6281 Muscle weakness (generalized): Secondary | ICD-10-CM | POA: Diagnosis not present

## 2023-03-03 DIAGNOSIS — M79662 Pain in left lower leg: Secondary | ICD-10-CM | POA: Diagnosis not present

## 2023-03-03 DIAGNOSIS — R2681 Unsteadiness on feet: Secondary | ICD-10-CM | POA: Diagnosis not present

## 2023-03-03 NOTE — Therapy (Signed)
OUTPATIENT PHYSICAL THERAPY PROSTHETICS TREATMENT   Patient Name: [name] MRN: [id number] DOB:04/21/1966, 57 y.o., male Today's Date: 03/03/2023  PCP: [name], MD REFERRING PROVIDER: Marcello Fennel, MD  END OF SESSION:  PT End of Session - 03/03/23 1109     Visit Number 4    Number of Visits 15    Date for PT Re-Evaluation 05/14/23    Authorization Type Medicare & BCBS    Progress Note Due on Visit 10    PT Start Time 1106    PT Stop Time 1151    PT Time Calculation (min) 45 min    Equipment Utilized During Treatment Gait belt    Activity Tolerance Patient tolerated treatment well;Patient limited by pain    Behavior During Therapy [facility] for tasks assessed/performed               Past Medical History:  Diagnosis Date   Acute osteomyelitis of right foot (HCC) 05/15/2019   Anemia 06/23/2019   Aortic stenosis    Bursitis    right shoulder   Complication of anesthesia    Coronary artery disease    Diabetes with renal manifestations(250.4) 08/23/2013   Diabetic infection of right foot (HCC)    Diabetic retinopathy associated with type 2 diabetes mellitus (HCC) 06/23/2019   ESRD on hemodialysis (HCC) 07/18/2011   Essential hypertension 10/18/2007   Qualifier: Diagnosis of  By: Everardo All MD, Gregary Signs A    History of blood transfusion    HYPERLIPIDEMIA 05/16/2008   Qualifier: Diagnosis of  By: Jonny Ruiz MD, Len Blalock    MRSA INFECTION 10/18/2007   Qualifier: Diagnosis of  By: Tyrone Apple, Lucy     Other forms of retinal detachment(361.89) 06/23/2019   PAD (peripheral artery disease) (HCC)    PONV (postoperative nausea and vomiting)    Prolonged QT interval    Pulmonary hypertension (HCC)    Secondary hyperparathyroidism (HCC) 06/23/2019   Sepsis (HCC) 05/14/2019   Past Surgical History:  Procedure Laterality Date   ABDOMINAL AORTOGRAM N/A 05/17/2019   Procedure: ABDOMINAL AORTOGRAM;  Surgeon: Sherren Kerns, MD;  Location: [facility] INVASIVE CV LAB;  Service:  Cardiovascular;  Laterality: N/A;   AMPUTATION TOE Right 05/14/2019   Procedure: PARTIAL AMPUTATION SECOND TOE RIGHT FOOT;  Surgeon: Park Liter, DPM;  Location: [facility] OR;  Service: Podiatry;  Laterality: Right;   AMPUTATION TOE Right 05/23/2019   Procedure: AMPUTATION OF SECOND TOE METATARSAL PHALANGEAL JOINT;  Surgeon: Park Liter, DPM;  Location: [facility] OR;  Service: Podiatry;  Laterality: Right;   AMPUTATION TOE Right 05/27/2019   Procedure: Amputation Toe;  Surgeon: Park Liter, DPM;  Location: [facility] OR;  Service: Podiatry;  Laterality: Right;  right third toe   APPLICATION OF WOUND VAC Right 05/27/2019   Procedure: Application Of Wound Vac;  Surgeon: Park Liter, DPM;  Location: [facility] OR;  Service: Podiatry;  Laterality: Right;   AV FISTULA PLACEMENT  06-18-11   Right brachiocephalic AVF   BONE BIOPSY Right 05/14/2019   Procedure: OPEN SUPERFICIAL BONE BIOPSY GREAT TOE;  Surgeon: Park Liter, DPM;  Location: [facility] OR;  Service: Podiatry;  Laterality: Right;   CARDIAC CATHETERIZATION     CORONARY BALLOON ANGIOPLASTY N/A 09/24/2020   Procedure: CORONARY BALLOON ANGIOPLASTY;  Surgeon: Swaziland, Peter M, MD;  Location: [facility] INVASIVE CV LAB;  Service: Cardiovascular;  Laterality: N/A;   CORONARY PRESSURE/FFR STUDY N/A 09/24/2020   Procedure: INTRAVASCULAR PRESSURE WIRE/FFR STUDY;  Surgeon: Swaziland, Peter M, MD;  Location: [facility] INV[facility] CV LAB;  Service: Cardiovascular;  Laterality: N/A;   CORONARY STENT INTERVENTION N/A 09/24/2020   Procedure: CORONARY STENT INTERVENTION;  Surgeon: Swaziland, Peter M, MD;  Location: [facility] INVASIVE CV LAB;  Service: Cardiovascular;  Laterality: N/A;   EYE SURGERY     left eye for Laser, diabetic retinopathy   FISTULOGRAM Right 11/06/2011   Procedure: FISTULOGRAM;  Surgeon: Fransisco Hertz, MD;  Location: [facility] CATH LAB;  Service: Cardiovascular;  Laterality: Right;   HEMATOMA EVACUATION Right Feb. 25, 2014   HEMATOMA EVACUATION Right 12/14/2012   Procedure: EVACUATION HEMATOMA;   Surgeon: Fransisco Hertz, MD;  Location: [name] OR;  Service: Vascular;  Laterality: Right;   INCISION AND DRAINAGE Right 05/23/2019   Procedure: INCISION AND DRAINAGE OF RIGHT FOOT DEEP SPACE ABSCESS;  Surgeon: Park Liter, DPM;  Location: [facility] OR;  Service: Podiatry;  Laterality: Right;   INJECTION OF SILICONE OIL Left 01/09/2023   Procedure: INJECTION OF SILICONE OIL;  Surgeon: Edmon Crape, MD;  Location: [facility] OR;  Service: Ophthalmology;  Laterality: Left;   INSERTION OF DIALYSIS CATHETER  12/14/2012   Procedure: INSERTION OF DIALYSIS CATHETER;  Surgeon: Fransisco Hertz, MD;  Location: [facility] OR;  Service: Vascular;;   INSERTION OF DIALYSIS CATHETER Right 01/07/2021   Procedure: INSERTION OF DIALYSIS CATHETER;  Surgeon: Sherren Kerns, MD;  Location: [facility] OR;  Service: Vascular;  Laterality: Right;   INSERTION OF DIALYSIS CATHETER Left 01/08/2021   Procedure: INSERTION OF LEFT INTERNAL JUGULAR DIALYSIS CATHETER, tunneled;  Surgeon: Chuck Hint, MD;  Location: [facility] OR;  Service: Vascular;  Laterality: Left;   IRRIGATION AND DEBRIDEMENT FOOT Right 05/25/2019   Procedure: INCISION AND DRAINAGE FOOT;  Surgeon: Park Liter, DPM;  Location: [facility] OR;  Service: Podiatry;  Laterality: Right;   IRRIGATION AND DEBRIDEMENT FOOT Right 05/27/2019   Procedure: IRRIGATION AND DEBRIDEMENT FOOT;  Surgeon: Park Liter, DPM;  Location: [facility] OR;  Service: Podiatry;  Laterality: Right;   LASER PHOTO ABLATION Left 01/09/2023   Procedure: LASER PHOTO ABLATION;  Surgeon: Edmon Crape, MD;  Location: [facility] OR;  Service: Ophthalmology;  Laterality: Left;   LEFT HEART CATH AND CORONARY ANGIOGRAPHY N/A 09/24/2020   Procedure: LEFT HEART CATH AND CORONARY ANGIOGRAPHY;  Surgeon: Swaziland, Peter M, MD;  Location: [facility] INVASIVE CV LAB;  Service: Cardiovascular;  Laterality: N/A;   LOWER EXTREMITY ANGIOGRAPHY Bilateral 05/17/2019   Procedure: LOWER EXTREMITY ANGIOGRAPHY;  Surgeon: Sherren Kerns, MD;  Location: [facility] INVASIVE CV LAB;   Service: Cardiovascular;  Laterality: Bilateral;   PARS PLANA VITRECTOMY Left 01/09/2023   Procedure: PARS PLANA VITRECTOMY WITH 25 GAUGE AND REPAIR OF RETINAL DETACHMENT;  Surgeon: Edmon Crape, MD;  Location: [facility] OR;  Service: Ophthalmology;  Laterality: Left;   REMOVAL OF A DIALYSIS CATHETER Right 01/08/2021   Procedure: REMOVAL OF TEMPORARY DIALYSIS CATHETER, LEFT FEMORAL VEIN;  Surgeon: Chuck Hint, MD;  Location: [facility] OR;  Service: Vascular;  Laterality: Right;   REVISON OF ARTERIOVENOUS FISTULA Right 12/14/2012   Procedure: REVISON OF upper arm ARTERIOVENOUS FISTULA using 7mmx10cm gortex graft;  Surgeon: Fransisco Hertz, MD;  Location: [facility] OR;  Service: Vascular;  Laterality: Right;   REVISON OF ARTERIOVENOUS FISTULA Right 12/27/2020   Procedure: RIGHT UPPER EXTREMITY ARTERIOVENOUS FISTULA REVISiON;  Surgeon: Leonie Douglas, MD;  Location: [facility] OR;  Service: Vascular;  Laterality: Right;  PERIPHERAL NERVE BLOCK   RIGHT/LEFT HEART CATH AND CORONARY ANGIOGRAPHY N/A 08/07/2020   Procedure: RIGHT/LEFT HEART  CATH AND CORONARY ANGIOGRAPHY;  Surgeon: Swaziland, Peter M, MD;  Location: [facility] INVASIVE CV LAB;  Service: Cardiovascular;  Laterality: N/A;   SOFT TISSUE MASS EXCISION     Right arm, Left leg  for MRSA infection   Patient Active Problem List   Diagnosis Date Noted   ACS (acute coronary syndrome) (HCC) 09/12/2021   Prolonged QT interval 09/12/2021   Unstable angina (HCC) 08/20/2021   Nuclear sclerotic cataract of right eye 06/11/2021   Nuclear sclerotic cataract of left eye 06/11/2021   Posterior subcapsular age-related cataract, left eye 06/11/2021   Stable treated proliferative diabetic retinopathy of left eye determined by examination associated with type 2 diabetes mellitus (HCC) 06/11/2021   Right epiretinal membrane 06/11/2021   Stable treated proliferative diabetic retinopathy of right eye determined by examination associated with type 2 diabetes mellitus (HCC) 06/11/2021    Hyperkalemia 01/09/2021   ESRD (end stage renal disease) (HCC)    Dialysis AV fistula malfunction, initial encounter (HCC) 01/07/2021   NSTEMI (non-ST elevated myocardial infarction) (HCC) 09/21/2020   3-vessel coronary artery disease 08/09/2020   Pulmonary hypertension, unspecified (HCC) 08/09/2020   Pancytopenia (HCC) 08/06/2020   Allergy, unspecified, subsequent encounter 06/28/2020   Anaphylactic shock, unspecified, subsequent encounter 06/28/2020   Lymphedema of both lower extremities 12/29/2019   Hyperphosphatemia 12/11/2019   Encounter for post surgical wound check 08/16/2019   Post-op pain 08/16/2019   S/P transmetatarsal amputation of foot, right (HCC) 08/16/2019   Unspecified protein-calorie malnutrition (HCC) 07/12/2019   PAD (peripheral artery disease) (HCC) 06/30/2019   Diabetic retinopathy associated with type 2 diabetes mellitus (HCC) 06/23/2019   Other forms of retinal detachment(361.89) 06/23/2019   Secondary hyperparathyroidism (HCC) 06/23/2019   Osteomyelitis, unspecified (HCC) 05/31/2019   Gangrene of toe of right foot (HCC)    Ulcer of right foot, with necrosis of bone (HCC)    Abscess of right foot    Diabetic infection of right foot (HCC)    Ulcer of right second toe with necrosis of bone (HCC)    Cellulitis of right foot 05/22/2019   Acute osteomyelitis of right foot (HCC) 05/15/2019   Osteomyelitis of great toe of right foot (HCC) 05/14/2019   Diabetic foot ulcer- right second toe 05/14/2019   Sepsis (HCC) 05/14/2019   Hypothyroidism, unspecified 01/11/2018   Hypercalcemia 06/14/2016   Dyslipidemia 05/11/2015   Hyperglycemia 05/05/2015   Diarrhea, unspecified 02/01/2015   Fever, unspecified 02/01/2015   Pain, unspecified 02/01/2015   Pruritus, unspecified 02/01/2015   Coagulation defect, unspecified (HCC) 07/07/2014   Encounter for removal of sutures 05/30/2014   Encounter for immunization 04/19/2014   Chest pain 08/23/2013   Type 2 diabetes mellitus  (HCC) 08/23/2013   Hemodialysis status (HCC) 08/23/2013   Chronic kidney disease, stage V (HCC) 08/23/2013   Obesity, unspecified 08/23/2013   Complication of vascular dialysis catheter 12/15/2012   Other fluid overload 12/07/2012   Other specified complication of vascular prosthetic devices, implants and grafts, subsequent encounter 08/06/2012   ESRD on dialysis (HCC) 07/18/2011   Pure hypercholesterolemia 06/23/2011   Iron deficiency anemia, unspecified 06/21/2011   HYPOGLYCEMIA, UNSPECIFIED 11/13/2008   RENAL INSUFFICIENCY 07/31/2008   FOOT ULCER, LEFT 06/29/2008   HYPERLIPIDEMIA 05/16/2008   MRSA INFECTION 10/18/2007   Essential hypertension 10/18/2007    ONSET DATE: 02/12/2023 prosthesis delivery  REFERRING DIAG: 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  No issues.  Pt accompanied by: significant other  PERTINENT HISTORY: left BKA, right TMA, DM2, PAD, CAD, MI, ESRD on HD, moderate AS, HTN, hypothyroidism, anemia, CHF  PAIN:  Are you having pain?   Yes: NPRS scale: this morning 0/10 Pain location: low back, muscles of both LEs Pain description: dull ache, sciatica pain Aggravating factors: walking with lean,  Relieving factors: exercises  PRECAUTIONS: Fall and Other: No BP RUE  WEIGHT BEARING RESTRICTIONS: No  FALLS: Has patient fallen in last 6 months? No  LIVING ENVIRONMENT: Lives with: lives with their spouse Lives in: House Home Access: Ramped entrance Home layout: Two level, 1/2 bath on main level, and stair lift Stairs: Yes:  Internal: 15 steps; on left going up and External: 2-5 steps; can reach both Has following equipment at home: Environmental consultant - 2 wheeled, Wheelchair (manual), [employer], Grab bars, and Ramped entry  PLOF: Independent with household mobility without device and Independent with community mobility without device  PATIENT GOALS:   to use prosthesis to be active including gardening, his yard has hill,   OBJECTIVE:  COGNITION: Overall cognitive status: Within functional limits for tasks assessed   SENSATION: WFL  POSTURE:  Eval / 02/17/2023:  rounded shoulders, forward head, flexed trunk , and weight shift right  LOWER EXTREMITY ROM:  ROM P:passive  A:active Right eval Left eval  Hip flexion    Hip extension Standing -15* Standing  -15*  Hip abduction    Hip adduction    Hip internal rotation    Hip external rotation    Knee flexion    Knee extension    Ankle dorsiflexion    Ankle plantarflexion    Ankle inversion    Ankle eversion     (Blank rows = not tested)  LOWER EXTREMITY MMT:  MMT Right eval Left eval  Hip flexion    Hip extension Functional 4/5 Functional 4/5  Hip abduction Functional 4/5 Functional 4/5  Hip adduction    Hip internal rotation    Hip external rotation    Knee flexion Functional 4/5 Functional 4/5  Knee extension Functional 4/5 Functional 4/5  Ankle dorsiflexion 3-/5 NA  Ankle plantarflexion  NA  Ankle inversion  NA  Ankle eversion  NA  (Blank rows = not tested)  TRANSFERS: Eval / 02/17/2023: Sit to stand: SBA requires use of armrests from 18" chair using back of legs against chair & RW to stabilize Stand to sit: SBA requires use of armrests from 18" chair using back of legs against chair & RW to stabilize  GAIT: Eval / 02/17/2023: Gait pattern: step to pattern, decreased step length- Right, decreased stance time- Left, decreased hip/knee flexion- Left, decreased ankle dorsiflexion- Right, Left hip hike, Right foot flat, genu  recurvatum- Left, antalgic, trendelenburg, lateral hip instability, trunk flexed, abducted- Left, and poor foot clearance- Right excessive BUE weight bearing on RW Distance walked: 100' Assistive device utilized: Environmental consultant - 2 wheeled and TTA prosthesis Level of assistance: SBA  FUNCTIONAL TESTs:  Eval / 4/30/2024Sharlene Motts Balance Scale: 11/56    CURRENT PROSTHETIC WEAR ASSESSMENT: Eval /  02/17/2023: Patient is dependent with: skin check, residual limb care, care of non-amputated limb, prosthetic cleaning, ply sock cleaning, correct ply sock adjustment, proper wear  schedule/adjustment, and proper weight-bearing schedule/adjustment Donning prosthesis: SBA Doffing prosthesis: SBA Prosthetic wear tolerance: 4 hours, 1x/day, 4 of 6 days since delivery Prosthetic weight bearing tolerance: 5 minutes with limb pain 8/10 Edema: pitting with 5 sec capillary refill Residual limb condition: cylindrical shape, no open areas, limited to no hair growth, scar dryness, normal color & temperature Prosthetic description: silicon liner with pin lock suspension, total contact socket with flexible inner socket, dynamic response foot K code/activity level with prosthetic use: Level 3    TODAY'S TREATMENT:                                                                                                                             DATE:  03/03/2023: Prosthetic Training with Transtibial Amputation: PT assessed residual limb with no changes noted.  PT recommended increasing wear to all awake hours except 1 hour midday.  He should check or dry residual limb midway of each wear to avoid excessive moisture.  PT instructed patient in donning prosthesis with long pants that do not stretch.  Patient verbalized understanding PT switched lacing of right shoe that has transmetatarsal amputation on foot.  Patient reports less slippage of shoe. PT instructed patient in use of 24 inch barstool for standing ADLs like kitchen.  This  can improve positioning for ADLs and increase trunk standing tolerance.  PT instructed in sit to/from stand from barstool.  Patient able to perform with light upper extremity assist.  Patient verbalizes understanding of barstool use. Patient ambulated 75 feet x 2 with cane stand-alone tip.  Initially he required hand-held assist but progressed to min assist/CGA on belt.  PT demo and verbal cues on negotiating ramp and curb with cane.  Patient negotiated curb and ramp with cane 3 reps each with minA.  Neuromuscular reeducation: PT demo and verbal cues including mirror for visual feedback on stationary stance with equal weightbearing.  Patient able to return demonstration with proper pelvic weight shift to midline. Standing in a corner for safety with chair back in front: Head turns right/left, up/down and diagonals.  First 5 reps of each with eyes open then 5 reps with eyes closed.  Standing on foam with eyes open 5 reps of each motion.  Patient required intermittent touch to walls and chair for safety.  Patient verbalizes understanding of how to perform this activity outside of PT. Standing with chair behind him and rolling walker in front: Patient able to don his jacket with intermittent touch.   02/26/2023: Prosthetic Training with Transtibial Amputation: PT instructed with demo how to properly correct rotation & patella in relief to determine rotation.  PT instructed in signs of sweating and how to dry limb/liner. PT instructed in rationale for increasing wear and fall risk when prosthesis is off. Increase wear to all awake hours except 2-3 hours midday. Dry limb & liner mid time of morning & evening wear.  Pt & his wife verbalized understanding of all of above prosthetic  care instruction.  PT instructed with demo & verbal cues on prosthetic gait with cane. Pt amb 100' with cane stand alone tip & HHA modA with noted knee instability.    02/24/2023: Prosthetic Training with Transtibial  Amputation: PT demo & verbal cues on adjusting ply socks including use of cutoff socks. Pt donne, amb 20' and patella depth to socket as guidance points. PT had pt perform with too few, too many & correct ply for limb volume today.  PT instructed that limb will shrink over time starting mornings with more ply and during day weight bearing pumps fluid requiring to add more ply socks.  Next day he may be back to same or only slightly more than this morning.  Dialysis effects limb volume / fluid retention. Pt verbalized better understanding.  PT verbal & demo cues on neg ramp & curb with TTA prosthesis & RW. Pt performed with RW with supervision.    HOME EXERCISE PROGRAM:  ASSESSMENT: CLINICAL IMPRESSION: Patient appears to understand home exercise program for balance in corner.  Patient improved gait with cane including ramp and curb with PT instruction.  He does need further training prior to utilizing without assistance.  OBJECTIVE IMPAIRMENTS: Abnormal gait, decreased activity tolerance, decreased balance, decreased knowledge of condition, decreased knowledge of use of DME, decreased mobility, difficulty walking, decreased strength, increased edema, impaired flexibility, postural dysfunction, prosthetic dependency , and pain.   ACTIVITY LIMITATIONS: carrying, lifting, bending, standing, squatting, stairs, transfers, and locomotion level  PARTICIPATION LIMITATIONS: meal prep, cleaning, driving, community activity, and yard work  PERSONAL FACTORS: Education, Fitness, Past/current experiences, Time since onset of injury/illness/exacerbation, and 3+ comorbidities: see PMH  are also affecting patient's functional outcome.   REHAB POTENTIAL: Good  CLINICAL DECISION MAKING: Evolving/moderate complexity  EVALUATION COMPLEXITY: Moderate   GOALS: Goals reviewed with patient? Yes  SHORT TERM GOALS: Target date: 03/19/2023  Patient donnes prosthesis modified independent & verbalizes proper  cleaning. Baseline: SEE OBJECTIVE DATA Goal status:  Ongoing 02/24/2023 2.  Patient tolerates prosthesis >12 hrs total /day without skin issues or limb pain <5/10 after standing. Baseline: SEE OBJECTIVE DATA Goal status: Ongoing 02/24/2023  3.  Patient able to stand 30 seconds without UE support with supervision. Baseline: SEE OBJECTIVE DATA Goal status: Ongoing 02/24/2023  4. Patient ambulates 60' with RW & prosthesis with supervision. Baseline: SEE OBJECTIVE DATA Goal status: Ongoing 02/24/2023  5. Patient negotiates ramps & curbs with RW & prosthesis with supervision. Baseline: SEE OBJECTIVE DATA Goal status: Ongoing 02/24/2023  LONG TERM GOALS: Target date: 05/14/2023  Patient demonstrates & verbalized understanding of prosthetic care to enable safe utilization of prosthesis. Baseline: SEE OBJECTIVE DATA Goal status: Ongoing 02/24/2023  Patient tolerates prosthesis wear >90% of awake hours without skin or limb pain issues. Baseline: SEE OBJECTIVE DATA Goal status: Ongoing 02/24/2023  Berg Balance >/= 30 / 56 to indicate lower fall risk Baseline: SEE OBJECTIVE DATA Goal status: Ongoing 02/24/2023  Patient ambulates >500' with prosthesis & LRAD independently Baseline: SEE OBJECTIVE DATA Goal status: Ongoing 02/24/2023  Patient negotiates ramps, curbs & stairs with single rail with prosthesis & LRAD independently. Baseline: SEE OBJECTIVE DATA Goal status: Ongoing 02/24/2023   PLAN:  PT FREQUENCY: 2x/week  PT DURATION: 12 weeks  PLANNED INTERVENTIONS: Therapeutic exercises, Therapeutic activity, Neuromuscular re-education, Balance training, Gait training, Patient/Family education, Self Care, Stair training, Prosthetic training, DME instructions, Re-evaluation, and physical performance testing  PLAN FOR NEXT SESSION: Check on HEP in corner, continue gait training with cane including ramps &  curbs. Instruct in stairs.  Continue standing balance activities.    Vladimir Faster, PT,  DPT 03/03/2023, 12:05 PM

## 2023-03-05 ENCOUNTER — Ambulatory Visit (INDEPENDENT_AMBULATORY_CARE_PROVIDER_SITE_OTHER): Payer: Medicare Other | Admitting: Physical Therapy

## 2023-03-05 ENCOUNTER — Encounter: Payer: Self-pay | Admitting: Physical Therapy

## 2023-03-05 DIAGNOSIS — M6281 Muscle weakness (generalized): Secondary | ICD-10-CM | POA: Diagnosis not present

## 2023-03-05 DIAGNOSIS — M79662 Pain in left lower leg: Secondary | ICD-10-CM

## 2023-03-05 DIAGNOSIS — R2681 Unsteadiness on feet: Secondary | ICD-10-CM

## 2023-03-05 DIAGNOSIS — R2689 Other abnormalities of gait and mobility: Secondary | ICD-10-CM | POA: Diagnosis not present

## 2023-03-05 NOTE — Therapy (Signed)
OUTPATIENT PHYSICAL THERAPY PROSTHETICS TREATMENT   Patient Name: [name] MRN: [id number] DOB:04-Oct-1966, 57 y.o., male Today's Date: 03/05/2023  PCP: [name], MD REFERRING PROVIDER: Marcello Fennel, MD  END OF SESSION:  PT End of Session - 03/05/23 0844     Visit Number 5    Number of Visits 15    Date for PT Re-Evaluation 05/14/23    Authorization Type Medicare & BCBS    Progress Note Due on Visit 10    PT Start Time 0844    PT Stop Time 0925    PT Time Calculation (min) 41 min    Equipment Utilized During Treatment Gait belt    Activity Tolerance Patient tolerated treatment well;Patient limited by pain    Behavior During Therapy [facility] for tasks assessed/performed               Past Medical History:  Diagnosis Date   Acute osteomyelitis of right foot (HCC) 05/15/2019   Anemia 06/23/2019   Aortic stenosis    Bursitis    right shoulder   Complication of anesthesia    Coronary artery disease    Diabetes with renal manifestations(250.4) 08/23/2013   Diabetic infection of right foot (HCC)    Diabetic retinopathy associated with type 2 diabetes mellitus (HCC) 06/23/2019   ESRD on hemodialysis (HCC) 07/18/2011   Essential hypertension 10/18/2007   Qualifier: Diagnosis of  By: Everardo All MD, Gregary Signs A    History of blood transfusion    HYPERLIPIDEMIA 05/16/2008   Qualifier: Diagnosis of  By: Jonny Ruiz MD, Len Blalock    MRSA INFECTION 10/18/2007   Qualifier: Diagnosis of  By: Tyrone Apple, Lucy     Other forms of retinal detachment(361.89) 06/23/2019   PAD (peripheral artery disease) (HCC)    PONV (postoperative nausea and vomiting)    Prolonged QT interval    Pulmonary hypertension (HCC)    Secondary hyperparathyroidism (HCC) 06/23/2019   Sepsis (HCC) 05/14/2019   Past Surgical History:  Procedure Laterality Date   ABDOMINAL AORTOGRAM N/A 05/17/2019   Procedure: ABDOMINAL AORTOGRAM;  Surgeon: Sherren Kerns, MD;  Location: [facility] INVASIVE CV LAB;  Service:  Cardiovascular;  Laterality: N/A;   AMPUTATION TOE Right 05/14/2019   Procedure: PARTIAL AMPUTATION SECOND TOE RIGHT FOOT;  Surgeon: Park Liter, DPM;  Location: [facility] OR;  Service: Podiatry;  Laterality: Right;   AMPUTATION TOE Right 05/23/2019   Procedure: AMPUTATION OF SECOND TOE METATARSAL PHALANGEAL JOINT;  Surgeon: Park Liter, DPM;  Location: [facility] OR;  Service: Podiatry;  Laterality: Right;   AMPUTATION TOE Right 05/27/2019   Procedure: Amputation Toe;  Surgeon: Park Liter, DPM;  Location: [facility] OR;  Service: Podiatry;  Laterality: Right;  right third toe   APPLICATION OF WOUND VAC Right 05/27/2019   Procedure: Application Of Wound Vac;  Surgeon: Park Liter, DPM;  Location: [facility] OR;  Service: Podiatry;  Laterality: Right;   AV FISTULA PLACEMENT  06-18-11   Right brachiocephalic AVF   BONE BIOPSY Right 05/14/2019   Procedure: OPEN SUPERFICIAL BONE BIOPSY GREAT TOE;  Surgeon: Park Liter, DPM;  Location: [facility] OR;  Service: Podiatry;  Laterality: Right;   CARDIAC CATHETERIZATION     CORONARY BALLOON ANGIOPLASTY N/A 09/24/2020   Procedure: CORONARY BALLOON ANGIOPLASTY;  Surgeon: Swaziland, Peter M, MD;  Location: [facility] INVASIVE CV LAB;  Service: Cardiovascular;  Laterality: N/A;   CORONARY PRESSURE/FFR STUDY N/A 09/24/2020   Procedure: INTRAVASCULAR PRESSURE WIRE/FFR STUDY;  Surgeon: Swaziland, Peter M, MD;  Location: [facility] INV[facility] CV LAB;  Service: Cardiovascular;  Laterality: N/A;   CORONARY STENT INTERVENTION N/A 09/24/2020   Procedure: CORONARY STENT INTERVENTION;  Surgeon: Swaziland, Peter M, MD;  Location: [facility] INVASIVE CV LAB;  Service: Cardiovascular;  Laterality: N/A;   EYE SURGERY     left eye for Laser, diabetic retinopathy   FISTULOGRAM Right 11/06/2011   Procedure: FISTULOGRAM;  Surgeon: Fransisco Hertz, MD;  Location: [facility] CATH LAB;  Service: Cardiovascular;  Laterality: Right;   HEMATOMA EVACUATION Right Feb. 25, 2014   HEMATOMA EVACUATION Right 12/14/2012   Procedure: EVACUATION HEMATOMA;   Surgeon: Fransisco Hertz, MD;  Location: [facility] OR;  Service: Vascular;  Laterality: Right;   INCISION AND DRAINAGE Right 05/23/2019   Procedure: INCISION AND DRAINAGE OF RIGHT FOOT DEEP SPACE ABSCESS;  Surgeon: Park Liter, DPM;  Location: [facility] OR;  Service: Podiatry;  Laterality: Right;   INJECTION OF SILICONE OIL Left 01/09/2023   Procedure: INJECTION OF SILICONE OIL;  Surgeon: Edmon Crape, MD;  Location: [facility] OR;  Service: Ophthalmology;  Laterality: Left;   INSERTION OF DIALYSIS CATHETER  12/14/2012   Procedure: INSERTION OF DIALYSIS CATHETER;  Surgeon: Fransisco Hertz, MD;  Location: [facility] OR;  Service: Vascular;;   INSERTION OF DIALYSIS CATHETER Right 01/07/2021   Procedure: INSERTION OF DIALYSIS CATHETER;  Surgeon: Sherren Kerns, MD;  Location: [facility] OR;  Service: Vascular;  Laterality: Right;   INSERTION OF DIALYSIS CATHETER Left 01/08/2021   Procedure: INSERTION OF LEFT INTERNAL JUGULAR DIALYSIS CATHETER, tunneled;  Surgeon: Chuck Hint, MD;  Location: [facility] OR;  Service: Vascular;  Laterality: Left;   IRRIGATION AND DEBRIDEMENT FOOT Right 05/25/2019   Procedure: INCISION AND DRAINAGE FOOT;  Surgeon: Park Liter, DPM;  Location: [facility] OR;  Service: Podiatry;  Laterality: Right;   IRRIGATION AND DEBRIDEMENT FOOT Right 05/27/2019   Procedure: IRRIGATION AND DEBRIDEMENT FOOT;  Surgeon: Park Liter, DPM;  Location: [facility] OR;  Service: Podiatry;  Laterality: Right;   LASER PHOTO ABLATION Left 01/09/2023   Procedure: LASER PHOTO ABLATION;  Surgeon: Edmon Crape, MD;  Location: [facility] OR;  Service: Ophthalmology;  Laterality: Left;   LEFT HEART CATH AND CORONARY ANGIOGRAPHY N/A 09/24/2020   Procedure: LEFT HEART CATH AND CORONARY ANGIOGRAPHY;  Surgeon: Swaziland, Peter M, MD;  Location: [facility] INVASIVE CV LAB;  Service: Cardiovascular;  Laterality: N/A;   LOWER EXTREMITY ANGIOGRAPHY Bilateral 05/17/2019   Procedure: LOWER EXTREMITY ANGIOGRAPHY;  Surgeon: Sherren Kerns, MD;  Location: [facility] INVASIVE CV LAB;   Service: Cardiovascular;  Laterality: Bilateral;   PARS PLANA VITRECTOMY Left 01/09/2023   Procedure: PARS PLANA VITRECTOMY WITH 25 GAUGE AND REPAIR OF RETINAL DETACHMENT;  Surgeon: Edmon Crape, MD;  Location: [facility] OR;  Service: Ophthalmology;  Laterality: Left;   REMOVAL OF A DIALYSIS CATHETER Right 01/08/2021   Procedure: REMOVAL OF TEMPORARY DIALYSIS CATHETER, LEFT FEMORAL VEIN;  Surgeon: Chuck Hint, MD;  Location: [facility] OR;  Service: Vascular;  Laterality: Right;   REVISON OF ARTERIOVENOUS FISTULA Right 12/14/2012   Procedure: REVISON OF upper arm ARTERIOVENOUS FISTULA using 29mmx10cm gortex graft;  Surgeon: Fransisco Hertz, MD;  Location: [facility] OR;  Service: Vascular;  Laterality: Right;   REVISON OF ARTERIOVENOUS FISTULA Right 12/27/2020   Procedure: RIGHT UPPER EXTREMITY ARTERIOVENOUS FISTULA REVISiON;  Surgeon: Leonie Douglas, MD;  Location: [facility] OR;  Service: Vascular;  Laterality: Right;  PERIPHERAL NERVE BLOCK   RIGHT/LEFT HEART CATH AND CORONARY ANGIOGRAPHY N/A 08/07/2020   Procedure: RIGHT/LEFT HEART  CATH AND CORONARY ANGIOGRAPHY;  Surgeon: Swaziland, Peter M, MD;  Location: [facility] INVASIVE CV LAB;  Service: Cardiovascular;  Laterality: N/A;   SOFT TISSUE MASS EXCISION     Right arm, Left leg  for MRSA infection   Patient Active Problem List   Diagnosis Date Noted   ACS (acute coronary syndrome) (HCC) 09/12/2021   Prolonged QT interval 09/12/2021   Unstable angina (HCC) 08/20/2021   Nuclear sclerotic cataract of right eye 06/11/2021   Nuclear sclerotic cataract of left eye 06/11/2021   Posterior subcapsular age-related cataract, left eye 06/11/2021   Stable treated proliferative diabetic retinopathy of left eye determined by examination associated with type 2 diabetes mellitus (HCC) 06/11/2021   Right epiretinal membrane 06/11/2021   Stable treated proliferative diabetic retinopathy of right eye determined by examination associated with type 2 diabetes mellitus (HCC) 06/11/2021    Hyperkalemia 01/09/2021   ESRD (end stage renal disease) (HCC)    Dialysis AV fistula malfunction, initial encounter (HCC) 01/07/2021   NSTEMI (non-ST elevated myocardial infarction) (HCC) 09/21/2020   3-vessel coronary artery disease 08/09/2020   Pulmonary hypertension, unspecified (HCC) 08/09/2020   Pancytopenia (HCC) 08/06/2020   Allergy, unspecified, subsequent encounter 06/28/2020   Anaphylactic shock, unspecified, subsequent encounter 06/28/2020   Lymphedema of both lower extremities 12/29/2019   Hyperphosphatemia 12/11/2019   Encounter for post surgical wound check 08/16/2019   Post-op pain 08/16/2019   S/P transmetatarsal amputation of foot, right (HCC) 08/16/2019   Unspecified protein-calorie malnutrition (HCC) 07/12/2019   PAD (peripheral artery disease) (HCC) 06/30/2019   Diabetic retinopathy associated with type 2 diabetes mellitus (HCC) 06/23/2019   Other forms of retinal detachment(361.89) 06/23/2019   Secondary hyperparathyroidism (HCC) 06/23/2019   Osteomyelitis, unspecified (HCC) 05/31/2019   Gangrene of toe of right foot (HCC)    Ulcer of right foot, with necrosis of bone (HCC)    Abscess of right foot    Diabetic infection of right foot (HCC)    Ulcer of right second toe with necrosis of bone (HCC)    Cellulitis of right foot 05/22/2019   Acute osteomyelitis of right foot (HCC) 05/15/2019   Osteomyelitis of great toe of right foot (HCC) 05/14/2019   Diabetic foot ulcer- right second toe 05/14/2019   Sepsis (HCC) 05/14/2019   Hypothyroidism, unspecified 01/11/2018   Hypercalcemia 06/14/2016   Dyslipidemia 05/11/2015   Hyperglycemia 05/05/2015   Diarrhea, unspecified 02/01/2015   Fever, unspecified 02/01/2015   Pain, unspecified 02/01/2015   Pruritus, unspecified 02/01/2015   Coagulation defect, unspecified (HCC) 07/07/2014   Encounter for removal of sutures 05/30/2014   Encounter for immunization 04/19/2014   Chest pain 08/23/2013   Type 2 diabetes mellitus  (HCC) 08/23/2013   Hemodialysis status (HCC) 08/23/2013   Chronic kidney disease, stage V (HCC) 08/23/2013   Obesity, unspecified 08/23/2013   Complication of vascular dialysis catheter 12/15/2012   Other fluid overload 12/07/2012   Other specified complication of vascular prosthetic devices, implants and grafts, subsequent encounter 08/06/2012   ESRD on dialysis (HCC) 07/18/2011   Pure hypercholesterolemia 06/23/2011   Iron deficiency anemia, unspecified 06/21/2011   HYPOGLYCEMIA, UNSPECIFIED 11/13/2008   RENAL INSUFFICIENCY 07/31/2008   FOOT ULCER, LEFT 06/29/2008   HYPERLIPIDEMIA 05/16/2008   MRSA INFECTION 10/18/2007   Essential hypertension 10/18/2007    ONSET DATE: 02/12/2023 prosthesis delivery  REFERRING DIAG: 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  He is wearing prosthesis all awake hours except 1 hour midday.    PERTINENT HISTORY: left BKA, right TMA, DM2, PAD, CAD, MI, ESRD on HD, moderate AS, HTN, hypothyroidism, anemia, CHF  PAIN:  Are you having pain?   Yes: NPRS scale: this morning 0/10 Pain location: low back, muscles of both LEs Pain description: dull ache, sciatica pain Aggravating factors: walking with lean,  Relieving factors: exercises  PRECAUTIONS: Fall and Other: No BP RUE  WEIGHT BEARING RESTRICTIONS: No  FALLS: Has patient fallen in last 6 months? No  LIVING ENVIRONMENT: Lives with: lives with their spouse Lives in: House Home Access: Ramped entrance Home layout: Two level, 1/2 bath on main level, and stair lift Stairs: Yes: Internal: 15 steps; on left  going up and External: 2-5 steps; can reach both Has following equipment at home: Environmental consultant - 2 wheeled, Wheelchair (manual), [employer], Grab bars, and Ramped entry  PLOF: Independent with household mobility without device and Independent with community mobility without device  PATIENT GOALS:   to use prosthesis to be active including gardening, his yard has hill,   OBJECTIVE:  COGNITION: Overall cognitive status: Within functional limits for tasks assessed   SENSATION: WFL  POSTURE:  Eval / 02/17/2023:  rounded shoulders, forward head, flexed trunk , and weight shift right  LOWER EXTREMITY ROM:  ROM P:passive  A:active Right eval Left eval  Hip flexion    Hip extension Standing -15* Standing  -15*  Hip abduction    Hip adduction    Hip internal rotation    Hip external rotation    Knee flexion    Knee extension    Ankle dorsiflexion    Ankle plantarflexion    Ankle inversion    Ankle eversion     (Blank rows = not tested)  LOWER EXTREMITY MMT:  MMT Right eval Left eval  Hip flexion    Hip extension Functional 4/5 Functional 4/5  Hip abduction Functional 4/5 Functional 4/5  Hip adduction    Hip internal rotation    Hip external rotation    Knee flexion Functional 4/5 Functional 4/5  Knee extension Functional 4/5 Functional 4/5  Ankle dorsiflexion 3-/5 NA  Ankle plantarflexion  NA  Ankle inversion  NA  Ankle eversion  NA  (Blank rows = not tested)  TRANSFERS: Eval / 02/17/2023: Sit to stand: SBA requires use of armrests from 18" chair using back of legs against chair & RW to stabilize Stand to sit: SBA requires use of armrests from 18" chair using back of legs against chair & RW to stabilize  GAIT: Eval / 02/17/2023: Gait pattern: step to pattern, decreased step length- Right, decreased stance time- Left, decreased hip/knee flexion- Left, decreased ankle dorsiflexion- Right, Left hip hike, Right foot flat, genu recurvatum- Left, antalgic,  trendelenburg, lateral hip instability, trunk flexed, abducted- Left, and poor foot clearance- Right excessive BUE weight bearing on RW Distance walked: 100' Assistive device utilized: Environmental consultant - 2 wheeled and TTA prosthesis Level of assistance: SBA  FUNCTIONAL TESTs:  Eval / 4/30/2024Sharlene Motts Balance Scale: 11/56    CURRENT PROSTHETIC WEAR ASSESSMENT: Eval /  02/17/2023: Patient is dependent with: skin check, residual limb care, care of non-amputated limb, prosthetic cleaning, ply sock cleaning, correct ply sock adjustment, proper wear schedule/adjustment, and proper  weight-bearing schedule/adjustment Donning prosthesis: SBA Doffing prosthesis: SBA Prosthetic wear tolerance: 4 hours, 1x/day, 4 of 6 days since delivery Prosthetic weight bearing tolerance: 5 minutes with limb pain 8/10 Edema: pitting with 5 sec capillary refill Residual limb condition: cylindrical shape, no open areas, limited to no hair growth, scar dryness, normal color & temperature Prosthetic description: silicon liner with pin lock suspension, total contact socket with flexible inner socket, dynamic response foot K code/activity level with prosthetic use: Level 3    TODAY'S TREATMENT:                                                                                                                             DATE:  03/05/2023: Prosthetic Training with Transtibial Amputation: PT educated pt on changing shoes, skin moisturizers and checking for sweat. Pt verbalized understanding.  PT educated in hamstring set seated, prior to sit/stand and upon heel strike in gait. Pt performed 10 reps ea with tactile & verbal cues. Pt amb 100' X 4 with cane with minA with cues on LLE rotation with balance reactions.    03/03/2023: Prosthetic Training with Transtibial Amputation: PT assessed residual limb with no changes noted.  PT recommended increasing wear to all awake hours except 1 hour midday.  He should check or dry residual limb  midway of each wear to avoid excessive moisture.  PT instructed patient in donning prosthesis with long pants that do not stretch.  Patient verbalized understanding PT switched lacing of right shoe that has transmetatarsal amputation on foot.  Patient reports less slippage of shoe. PT instructed patient in use of 24 inch barstool for standing ADLs like kitchen.  This can improve positioning for ADLs and increase trunk standing tolerance.  PT instructed in sit to/from stand from barstool.  Patient able to perform with light upper extremity assist.  Patient verbalizes understanding of barstool use. Patient ambulated 75 feet x 2 with cane stand-alone tip.  Initially he required hand-held assist but progressed to min assist/CGA on belt.  PT demo and verbal cues on negotiating ramp and curb with cane.  Patient negotiated curb and ramp with cane 3 reps each with minA.  Neuromuscular reeducation: PT demo and verbal cues including mirror for visual feedback on stationary stance with equal weightbearing.  Patient able to return demonstration with proper pelvic weight shift to midline. Standing in a corner for safety with chair back in front: Head turns right/left, up/down and diagonals.  First 5 reps of each with eyes open then 5 reps with eyes closed.  Standing on foam with eyes open 5 reps of each motion.  Patient required intermittent touch to walls and chair for safety.  Patient verbalizes understanding of how to perform this activity outside of PT. Standing with chair behind him and rolling walker in front: Patient able to don his jacket with intermittent touch.   02/26/2023: Prosthetic Training with Transtibial Amputation: PT instructed with demo how to properly correct rotation & patella  in relief to determine rotation.  PT instructed in signs of sweating and how to dry limb/liner. PT instructed in rationale for increasing wear and fall risk when prosthesis is off. Increase wear to all awake hours except  2-3 hours midday. Dry limb & liner mid time of morning & evening wear.  Pt & his wife verbalized understanding of all of above prosthetic care instruction.  PT instructed with demo & verbal cues on prosthetic gait with cane. Pt amb 100' with cane stand alone tip & HHA modA with noted knee instability.    HOME EXERCISE PROGRAM:  ASSESSMENT: CLINICAL IMPRESSION: Patient had less limb pain with hamstring sets with functional activities to limit over use of quads pushing distal tibia into anterior socket. Pt is improving function with prosthesis.   OBJECTIVE IMPAIRMENTS: Abnormal gait, decreased activity tolerance, decreased balance, decreased knowledge of condition, decreased knowledge of use of DME, decreased mobility, difficulty walking, decreased strength, increased edema, impaired flexibility, postural dysfunction, prosthetic dependency , and pain.   ACTIVITY LIMITATIONS: carrying, lifting, bending, standing, squatting, stairs, transfers, and locomotion level  PARTICIPATION LIMITATIONS: meal prep, cleaning, driving, community activity, and yard work  PERSONAL FACTORS: Education, Fitness, Past/current experiences, Time since onset of injury/illness/exacerbation, and 3+ comorbidities: see PMH  are also affecting patient's functional outcome.   REHAB POTENTIAL: Good  CLINICAL DECISION MAKING: Evolving/moderate complexity  EVALUATION COMPLEXITY: Moderate   GOALS: Goals reviewed with patient? Yes  SHORT TERM GOALS: Target date: 03/19/2023  Patient donnes prosthesis modified independent & verbalizes proper cleaning. Baseline: SEE OBJECTIVE DATA Goal status:  Ongoing 02/24/2023 2.  Patient tolerates prosthesis >12 hrs total /day without skin issues or limb pain <5/10 after standing. Baseline: SEE OBJECTIVE DATA Goal status: Ongoing 02/24/2023  3.  Patient able to stand 30 seconds without UE support with supervision. Baseline: SEE OBJECTIVE DATA Goal status: Ongoing 02/24/2023  4. Patient  ambulates 65' with RW & prosthesis with supervision. Baseline: SEE OBJECTIVE DATA Goal status: Ongoing 02/24/2023  5. Patient negotiates ramps & curbs with RW & prosthesis with supervision. Baseline: SEE OBJECTIVE DATA Goal status: Ongoing 02/24/2023  LONG TERM GOALS: Target date: 05/14/2023  Patient demonstrates & verbalized understanding of prosthetic care to enable safe utilization of prosthesis. Baseline: SEE OBJECTIVE DATA Goal status: Ongoing 02/24/2023  Patient tolerates prosthesis wear >90% of awake hours without skin or limb pain issues. Baseline: SEE OBJECTIVE DATA Goal status: Ongoing 02/24/2023  Berg Balance >/= 30 / 56 to indicate lower fall risk Baseline: SEE OBJECTIVE DATA Goal status: Ongoing 02/24/2023  Patient ambulates >500' with prosthesis & LRAD independently Baseline: SEE OBJECTIVE DATA Goal status: Ongoing 02/24/2023  Patient negotiates ramps, curbs & stairs with single rail with prosthesis & LRAD independently. Baseline: SEE OBJECTIVE DATA Goal status: Ongoing 02/24/2023   PLAN:  PT FREQUENCY: 2x/week  PT DURATION: 12 weeks  PLANNED INTERVENTIONS: Therapeutic exercises, Therapeutic activity, Neuromuscular re-education, Balance training, Gait training, Patient/Family education, Self Care, Stair training, Prosthetic training, DME instructions, Re-evaluation, and physical performance testing  PLAN FOR NEXT SESSION: instruct in use of recumbent stepper & weight machines, continue gait training with cane including ramps & curbs. Instruct in stairs.  Continue standing balance activities.    Vladimir Faster, PT, DPT 03/05/2023, 12:56 PM

## 2023-03-10 ENCOUNTER — Encounter: Payer: Self-pay | Admitting: Physical Therapy

## 2023-03-10 ENCOUNTER — Ambulatory Visit (INDEPENDENT_AMBULATORY_CARE_PROVIDER_SITE_OTHER): Payer: Medicare Other | Admitting: Physical Therapy

## 2023-03-10 DIAGNOSIS — R2689 Other abnormalities of gait and mobility: Secondary | ICD-10-CM | POA: Diagnosis not present

## 2023-03-10 DIAGNOSIS — M6281 Muscle weakness (generalized): Secondary | ICD-10-CM

## 2023-03-10 DIAGNOSIS — R2681 Unsteadiness on feet: Secondary | ICD-10-CM

## 2023-03-10 DIAGNOSIS — M79662 Pain in left lower leg: Secondary | ICD-10-CM | POA: Diagnosis not present

## 2023-03-10 NOTE — Therapy (Signed)
OUTPATIENT PHYSICAL THERAPY PROSTHETICS TREATMENT   Patient Name: [name] MRN: [id number] DOB:07-18-1966, 57 y.o., male Today's Date: 03/10/2023  PCP: [name], MD REFERRING PROVIDER: Marcello Fennel, MD  END OF SESSION:  PT End of Session - 03/10/23 1142     Visit Number 6    Number of Visits 15    Date for PT Re-Evaluation 05/14/23    Authorization Type Medicare & BCBS    Progress Note Due on Visit 10    PT Start Time 1142    PT Stop Time 1230    PT Time Calculation (min) 48 min    Equipment Utilized During Treatment Gait belt    Activity Tolerance Patient tolerated treatment well;Patient limited by pain    Behavior During Therapy [facility] for tasks assessed/performed               Past Medical History:  Diagnosis Date   Acute osteomyelitis of right foot (HCC) 05/15/2019   Anemia 06/23/2019   Aortic stenosis    Bursitis    right shoulder   Complication of anesthesia    Coronary artery disease    Diabetes with renal manifestations(250.4) 08/23/2013   Diabetic infection of right foot (HCC)    Diabetic retinopathy associated with type 2 diabetes mellitus (HCC) 06/23/2019   ESRD on hemodialysis (HCC) 07/18/2011   Essential hypertension 10/18/2007   Qualifier: Diagnosis of  By: Everardo All MD, Gregary Signs A    History of blood transfusion    HYPERLIPIDEMIA 05/16/2008   Qualifier: Diagnosis of  By: Jonny Ruiz MD, Len Blalock    MRSA INFECTION 10/18/2007   Qualifier: Diagnosis of  By: Tyrone Apple, Lucy     Other forms of retinal detachment(361.89) 06/23/2019   PAD (peripheral artery disease) (HCC)    PONV (postoperative nausea and vomiting)    Prolonged QT interval    Pulmonary hypertension (HCC)    Secondary hyperparathyroidism (HCC) 06/23/2019   Sepsis (HCC) 05/14/2019   Past Surgical History:  Procedure Laterality Date   ABDOMINAL AORTOGRAM N/A 05/17/2019   Procedure: ABDOMINAL AORTOGRAM;  Surgeon: Sherren Kerns, MD;  Location: [facility] INVASIVE CV LAB;  Service:  Cardiovascular;  Laterality: N/A;   AMPUTATION TOE Right 05/14/2019   Procedure: PARTIAL AMPUTATION SECOND TOE RIGHT FOOT;  Surgeon: Park Liter, DPM;  Location: [facility] OR;  Service: Podiatry;  Laterality: Right;   AMPUTATION TOE Right 05/23/2019   Procedure: AMPUTATION OF SECOND TOE METATARSAL PHALANGEAL JOINT;  Surgeon: Park Liter, DPM;  Location: [facility] OR;  Service: Podiatry;  Laterality: Right;   AMPUTATION TOE Right 05/27/2019   Procedure: Amputation Toe;  Surgeon: Park Liter, DPM;  Location: [facility] OR;  Service: Podiatry;  Laterality: Right;  right third toe   APPLICATION OF WOUND VAC Right 05/27/2019   Procedure: Application Of Wound Vac;  Surgeon: Park Liter, DPM;  Location: [facility] OR;  Service: Podiatry;  Laterality: Right;   AV FISTULA PLACEMENT  06-18-11   Right brachiocephalic AVF   BONE BIOPSY Right 05/14/2019   Procedure: OPEN SUPERFICIAL BONE BIOPSY GREAT TOE;  Surgeon: Park Liter, DPM;  Location: [facility] OR;  Service: Podiatry;  Laterality: Right;   CARDIAC CATHETERIZATION     CORONARY BALLOON ANGIOPLASTY N/A 09/24/2020   Procedure: CORONARY BALLOON ANGIOPLASTY;  Surgeon: Swaziland, Peter M, MD;  Location: [facility] INVASIVE CV LAB;  Service: Cardiovascular;  Laterality: N/A;   CORONARY PRESSURE/FFR STUDY N/A 09/24/2020   Procedure: INTRAVASCULAR PRESSURE WIRE/FFR STUDY;  Surgeon: Swaziland, Peter M, MD;  Location: [facility];  Service: Cardiovascular;  Laterality: N/A;   CORONARY STENT INTERVENTION N/A 09/24/2020   Procedure: CORONARY STENT INTERVENTION;  Surgeon: Swaziland, Peter M, MD;  Location: [facility] INVASIVE CV LAB;  Service: Cardiovascular;  Laterality: N/A;   EYE SURGERY     left eye for Laser, diabetic retinopathy   FISTULOGRAM Right 11/06/2011   Procedure: FISTULOGRAM;  Surgeon: Fransisco Hertz, MD;  Location: [facility] CATH LAB;  Service: Cardiovascular;  Laterality: Right;   HEMATOMA EVACUATION Right Feb. 25, 2014   HEMATOMA EVACUATION Right 12/14/2012   Procedure: EVACUATION HEMATOMA;   Surgeon: Fransisco Hertz, MD;  Location: [facility] OR;  Service: Vascular;  Laterality: Right;   INCISION AND DRAINAGE Right 05/23/2019   Procedure: INCISION AND DRAINAGE OF RIGHT FOOT DEEP SPACE ABSCESS;  Surgeon: Park Liter, DPM;  Location: [facility] OR;  Service: Podiatry;  Laterality: Right;   INJECTION OF SILICONE OIL Left 01/09/2023   Procedure: INJECTION OF SILICONE OIL;  Surgeon: Edmon Crape, MD;  Location: [facility] OR;  Service: Ophthalmology;  Laterality: Left;   INSERTION OF DIALYSIS CATHETER  12/14/2012   Procedure: INSERTION OF DIALYSIS CATHETER;  Surgeon: Fransisco Hertz, MD;  Location: [facility] OR;  Service: Vascular;;   INSERTION OF DIALYSIS CATHETER Right 01/07/2021   Procedure: INSERTION OF DIALYSIS CATHETER;  Surgeon: Sherren Kerns, MD;  Location: [facility] OR;  Service: Vascular;  Laterality: Right;   INSERTION OF DIALYSIS CATHETER Left 01/08/2021   Procedure: INSERTION OF LEFT INTERNAL JUGULAR DIALYSIS CATHETER, tunneled;  Surgeon: Chuck Hint, MD;  Location: [facility] OR;  Service: Vascular;  Laterality: Left;   IRRIGATION AND DEBRIDEMENT FOOT Right 05/25/2019   Procedure: INCISION AND DRAINAGE FOOT;  Surgeon: Park Liter, DPM;  [facility]: [facility];  Service: Podiatry;  Laterality: Right;   IRRIGATION AND DEBRIDEMENT FOOT Right 05/27/2019   Procedure: IRRIGATION AND DEBRIDEMENT FOOT;  Surgeon: Park Liter, DPM;  Location: [facility] OR;  Service: Podiatry;  Laterality: Right;   LASER PHOTO ABLATION Left 01/09/2023   Procedure: LASER PHOTO ABLATION;  Surgeon: Edmon Crape, MD;  Location: [facility] OR;  Service: Ophthalmology;  Laterality: Left;   LEFT HEART CATH AND CORONARY ANGIOGRAPHY N/A 09/24/2020   Procedure: LEFT HEART CATH AND CORONARY ANGIOGRAPHY;  Surgeon: Swaziland, Peter M, MD;  Location: [facility] INVASIVE CV LAB;  Service: Cardiovascular;  Laterality: N/A;   LOWER EXTREMITY ANGIOGRAPHY Bilateral 05/17/2019   Procedure: LOWER EXTREMITY ANGIOGRAPHY;  Surgeon: Sherren Kerns, MD;  [facility]: [facility] INVAS[facility] CV LAB;   Service: Cardiovascular;  Laterality: Bilateral;   PARS PLANA VITRECTOMY Left 01/09/2023   Procedure: PARS PLANA VITRECTOMY WITH 25 GAUGE AND REPAIR OF RETINAL DETACHMENT;  Surgeon: Edmon Crape, MD;  Location: [facility] OR;  Service: Ophthalmology;  Laterality: Left;   REMOVAL OF A DIALYSIS CATHETER Right 01/08/2021   Procedure: REMOVAL OF TEMPORARY DIALYSIS CATHETER, LEFT FEMORAL VEIN;  Surgeon: Chuck Hint, MD;  Location: [facility] OR;  Service: Vascular;  Laterality: Right;   REVISON OF ARTERIOVENOUS FISTULA Right 12/14/2012   Procedure: REVISON OF upper arm ARTERIOVENOUS FISTULA using 40mmx10cm gortex graft;  Surgeon: Fransisco Hertz, MD;  Location: [facility] OR;  Service: Vascular;  Laterality: Right;   REVISON OF ARTERIOVENOUS FISTULA Right 12/27/2020   Procedure: RIGHT UPPER EXTREMITY ARTERIOVENOUS FISTULA REVISiON;  Surgeon: Leonie Douglas, MD;  Location: [facility] OR;  Service: Vascular;  Laterality: Right;  PERIPHERAL NERVE BLOCK   RIGHT/LEFT HEART CATH AND CORONARY ANGIOGRAPHY N/A 08/07/2020   Procedure: RIGHT/LEFT HEART  CATH AND CORONARY ANGIOGRAPHY;  Surgeon: Swaziland, Peter M, MD;  Location: [facility] INVASIVE CV LAB;  Service: Cardiovascular;  Laterality: N/A;   SOFT TISSUE MASS EXCISION     Right arm, Left leg  for MRSA infection   Patient Active Problem List   Diagnosis Date Noted   ACS (acute coronary syndrome) (HCC) 09/12/2021   Prolonged QT interval 09/12/2021   Unstable angina (HCC) 08/20/2021   Nuclear sclerotic cataract of right eye 06/11/2021   Nuclear sclerotic cataract of left eye 06/11/2021   Posterior subcapsular age-related cataract, left eye 06/11/2021   Stable treated proliferative diabetic retinopathy of left eye determined by examination associated with type 2 diabetes mellitus (HCC) 06/11/2021   Right epiretinal membrane 06/11/2021   Stable treated proliferative diabetic retinopathy of right eye determined by examination associated with type 2 diabetes mellitus (HCC) 06/11/2021    Hyperkalemia 01/09/2021   ESRD (end stage renal disease) (HCC)    Dialysis AV fistula malfunction, initial encounter (HCC) 01/07/2021   NSTEMI (non-ST elevated myocardial infarction) (HCC) 09/21/2020   3-vessel coronary artery disease 08/09/2020   Pulmonary hypertension, unspecified (HCC) 08/09/2020   Pancytopenia (HCC) 08/06/2020   Allergy, unspecified, subsequent encounter 06/28/2020   Anaphylactic shock, unspecified, subsequent encounter 06/28/2020   Lymphedema of both lower extremities 12/29/2019   Hyperphosphatemia 12/11/2019   Encounter for post surgical wound check 08/16/2019   Post-op pain 08/16/2019   S/P transmetatarsal amputation of foot, right (HCC) 08/16/2019   Unspecified protein-calorie malnutrition (HCC) 07/12/2019   PAD (peripheral artery disease) (HCC) 06/30/2019   Diabetic retinopathy associated with type 2 diabetes mellitus (HCC) 06/23/2019   Other forms of retinal detachment(361.89) 06/23/2019   Secondary hyperparathyroidism (HCC) 06/23/2019   Osteomyelitis, unspecified (HCC) 05/31/2019   Gangrene of toe of right foot (HCC)    Ulcer of right foot, with necrosis of bone (HCC)    Abscess of right foot    Diabetic infection of right foot (HCC)    Ulcer of right second toe with necrosis of bone (HCC)    Cellulitis of right foot 05/22/2019   Acute osteomyelitis of right foot (HCC) 05/15/2019   Osteomyelitis of great toe of right foot (HCC) 05/14/2019   Diabetic foot ulcer- right second toe 05/14/2019   Sepsis (HCC) 05/14/2019   Hypothyroidism, unspecified 01/11/2018   Hypercalcemia 06/14/2016   Dyslipidemia 05/11/2015   Hyperglycemia 05/05/2015   Diarrhea, unspecified 02/01/2015   Fever, unspecified 02/01/2015   Pain, unspecified 02/01/2015   Pruritus, unspecified 02/01/2015   Coagulation defect, unspecified (HCC) 07/07/2014   Encounter for removal of sutures 05/30/2014   Encounter for immunization 04/19/2014   Chest pain 08/23/2013   Type 2 diabetes mellitus  (HCC) 08/23/2013   Hemodialysis status (HCC) 08/23/2013   Chronic kidney disease, stage V (HCC) 08/23/2013   Obesity, unspecified 08/23/2013   Complication of vascular dialysis catheter 12/15/2012   Other fluid overload 12/07/2012   Other specified complication of vascular prosthetic devices, implants and grafts, subsequent encounter 08/06/2012   ESRD on dialysis (HCC) 07/18/2011   Pure hypercholesterolemia 06/23/2011   Iron deficiency anemia, unspecified 06/21/2011   HYPOGLYCEMIA, UNSPECIFIED 11/13/2008   RENAL INSUFFICIENCY 07/31/2008   FOOT ULCER, LEFT 06/29/2008   HYPERLIPIDEMIA 05/16/2008   MRSA INFECTION 10/18/2007   Essential hypertension 10/18/2007    ONSET DATE: 02/12/2023 prosthesis delivery  REFERRING DIAG: 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  He is wearing prosthesis all awake hours except 1 hour midday.    PERTINENT HISTORY: left BKA, right TMA, DM2, PAD, CAD, MI, ESRD on HD, moderate AS, HTN, hypothyroidism, anemia, CHF  PAIN:  Are you having pain?   Yes: NPRS scale: this morning 0/10 Pain location: low back, muscles of both LEs Pain description: dull ache, sciatica pain Aggravating factors: walking with lean,  Relieving factors: exercises  PRECAUTIONS: Fall and Other: No BP RUE  WEIGHT BEARING RESTRICTIONS: No  FALLS: Has patient fallen in last 6 months? No  LIVING ENVIRONMENT: Lives with: lives with their spouse Lives in: House Home Access: Ramped entrance Home layout: Two level, 1/2 bath on main level, and  stair lift Stairs: Yes: Internal: 15 steps; on left going up and External: 2-5 steps; can reach both Has following equipment at home: Environmental consultant - 2 wheeled, Wheelchair (manual), [employer], Grab bars, and Ramped entry  PLOF: Independent with household mobility without device and Independent with community mobility without device  PATIENT GOALS:   to use prosthesis to be active including gardening, his yard has hill,   OBJECTIVE:  COGNITION: Overall cognitive status: Within functional limits for tasks assessed   SENSATION: WFL  POSTURE:  Eval / 02/17/2023:  rounded shoulders, forward head, flexed trunk , and weight shift right  LOWER EXTREMITY ROM:  ROM P:passive  A:active Right eval Left eval  Hip flexion    Hip extension Standing -15* Standing  -15*  Hip abduction    Hip adduction    Hip internal rotation    Hip external rotation    Knee flexion    Knee extension    Ankle dorsiflexion    Ankle plantarflexion    Ankle inversion    Ankle eversion     (Blank rows = not tested)  LOWER EXTREMITY MMT:  MMT Right eval Left eval  Hip flexion    Hip extension Functional 4/5 Functional 4/5  Hip abduction Functional 4/5 Functional 4/5  Hip adduction    Hip internal rotation    Hip external rotation    Knee flexion Functional 4/5 Functional 4/5  Knee extension Functional 4/5 Functional 4/5  Ankle dorsiflexion 3-/5 NA  Ankle plantarflexion  NA  Ankle inversion  NA  Ankle eversion  NA  (Blank rows = not tested)  TRANSFERS: Eval / 02/17/2023: Sit to stand: SBA requires use of armrests from 18" chair using back of legs against chair & RW to stabilize Stand to sit: SBA requires use of armrests from 18" chair using back of legs against chair & RW to stabilize  GAIT: Eval / 02/17/2023: Gait pattern: step to pattern, decreased step length- Right, decreased stance time- Left, decreased hip/knee flexion- Left, decreased ankle dorsiflexion- Right, Left hip hike, Right  foot flat, genu recurvatum- Left, antalgic, trendelenburg, lateral hip instability, trunk flexed, abducted- Left, and poor foot clearance- Right excessive BUE weight bearing on RW Distance walked: 100' Assistive device utilized: Environmental consultant - 2 wheeled and TTA prosthesis Level of assistance: SBA  FUNCTIONAL TESTs:  Eval / 4/30/2024Sharlene Motts Balance Scale: 11/56    CURRENT PROSTHETIC WEAR ASSESSMENT: Eval /  02/17/2023: Patient is dependent with: skin check, residual limb care, care of non-amputated limb, prosthetic cleaning, ply sock  cleaning, correct ply sock adjustment, proper wear schedule/adjustment, and proper weight-bearing schedule/adjustment Donning prosthesis: SBA Doffing prosthesis: SBA Prosthetic wear tolerance: 4 hours, 1x/day, 4 of 6 days since delivery Prosthetic weight bearing tolerance: 5 minutes with limb pain 8/10 Edema: pitting with 5 sec capillary refill Residual limb condition: cylindrical shape, no open areas, limited to no hair growth, scar dryness, normal color & temperature Prosthetic description: silicon liner with pin lock suspension, total contact socket with flexible inner socket, dynamic response foot K code/activity level with prosthetic use: Level 3    TODAY'S TREATMENT:                                                                                                                             DATE:  03/10/2023: Prosthetic Training with Transtibial Amputation: PT reviewed donning liner without folding skin. PT recommended increasing wear to all awake hours drying limb/liner twice and prn.  Pt verbalized understanding.  Pt amb 75' and 50' X 2 with cane with minA.  Therapeutic Exercise: Leg press BLEs 100# 15 reps 2 sets. Nustep seat 12 level 5 with BLEs/BUEs 7 min. PT cued on set up. Seated chest press machine 10# 10 reps & row 15# 10 reps. PT cued on use & set-up. Knee ext machine 10# 10 reps  Knee flex machine 20# 10 reps Pt verbalized understanding of above  equipment at gym.      03/05/2023: Prosthetic Training with Transtibial Amputation: PT educated pt on changing shoes, skin moisturizers and checking for sweat. Pt verbalized understanding.  PT educated in hamstring set seated, prior to sit/stand and upon heel strike in gait. Pt performed 10 reps ea with tactile & verbal cues. Pt amb 100' X 4 with cane with minA with cues on LLE rotation with balance reactions.    03/03/2023: Prosthetic Training with Transtibial Amputation: PT assessed residual limb with no changes noted.  PT recommended increasing wear to all awake hours except 1 hour midday.  He should check or dry residual limb midway of each wear to avoid excessive moisture.  PT instructed patient in donning prosthesis with long pants that do not stretch.  Patient verbalized understanding PT switched lacing of right shoe that has transmetatarsal amputation on foot.  Patient reports less slippage of shoe. PT instructed patient in use of 24 inch barstool for standing ADLs like kitchen.  This can improve positioning for ADLs and increase trunk standing tolerance.  PT instructed in sit to/from stand from barstool.  Patient able to perform with light upper extremity assist.  Patient verbalizes understanding of barstool use. Patient ambulated 75 feet x 2 with cane stand-alone tip.  Initially he required hand-held assist but progressed to min assist/CGA on belt.  PT demo and verbal cues on negotiating ramp and curb with cane.  Patient negotiated curb and ramp with cane 3 reps each with minA.  Neuromuscular reeducation: PT demo and verbal cues including mirror for visual feedback on stationary stance with  equal weightbearing.  Patient able to return demonstration with proper pelvic weight shift to midline. Standing in a corner for safety with chair back in front: Head turns right/left, up/down and diagonals.  First 5 reps of each with eyes open then 5 reps with eyes closed.  Standing on foam with eyes  open 5 reps of each motion.  Patient required intermittent touch to walls and chair for safety.  Patient verbalizes understanding of how to perform this activity outside of PT. Standing with chair behind him and rolling walker in front: Patient able to don his jacket with intermittent touch.   HOME EXERCISE PROGRAM:  ASSESSMENT: CLINICAL IMPRESSION: Pt appears to understand use of weight equipment and seated cardio equipment.  Patient is improving function at household level with cane.  Pt is improving function with prosthesis.   OBJECTIVE IMPAIRMENTS: Abnormal gait, decreased activity tolerance, decreased balance, decreased knowledge of condition, decreased knowledge of use of DME, decreased mobility, difficulty walking, decreased strength, increased edema, impaired flexibility, postural dysfunction, prosthetic dependency , and pain.   ACTIVITY LIMITATIONS: carrying, lifting, bending, standing, squatting, stairs, transfers, and locomotion level  PARTICIPATION LIMITATIONS: meal prep, cleaning, driving, community activity, and yard work  PERSONAL FACTORS: Education, Fitness, Past/current experiences, Time since onset of injury/illness/exacerbation, and 3+ comorbidities: see PMH  are also affecting patient's functional outcome.   REHAB POTENTIAL: Good  CLINICAL DECISION MAKING: Evolving/moderate complexity  EVALUATION COMPLEXITY: Moderate   GOALS: Goals reviewed with patient? Yes  SHORT TERM GOALS: Target date: 03/19/2023  Patient donnes prosthesis modified independent & verbalizes proper cleaning. Baseline: SEE OBJECTIVE DATA Goal status:  Ongoing 02/24/2023 2.  Patient tolerates prosthesis >12 hrs total /day without skin issues or limb pain <5/10 after standing. Baseline: SEE OBJECTIVE DATA Goal status: Ongoing 02/24/2023  3.  Patient able to stand 30 seconds without UE support with supervision. Baseline: SEE OBJECTIVE DATA Goal status: Ongoing 02/24/2023  4. Patient ambulates 56'  with RW & prosthesis with supervision. Baseline: SEE OBJECTIVE DATA Goal status: Ongoing 02/24/2023  5. Patient negotiates ramps & curbs with RW & prosthesis with supervision. Baseline: SEE OBJECTIVE DATA Goal status: Ongoing 02/24/2023  LONG TERM GOALS: Target date: 05/14/2023  Patient demonstrates & verbalized understanding of prosthetic care to enable safe utilization of prosthesis. Baseline: SEE OBJECTIVE DATA Goal status: Ongoing 02/24/2023  Patient tolerates prosthesis wear >90% of awake hours without skin or limb pain issues. Baseline: SEE OBJECTIVE DATA Goal status: Ongoing 02/24/2023  Berg Balance >/= 30 / 56 to indicate lower fall risk Baseline: SEE OBJECTIVE DATA Goal status: Ongoing 02/24/2023  Patient ambulates >500' with prosthesis & LRAD independently Baseline: SEE OBJECTIVE DATA Goal status: Ongoing 02/24/2023  Patient negotiates ramps, curbs & stairs with single rail with prosthesis & LRAD independently. Baseline: SEE OBJECTIVE DATA Goal status: Ongoing 02/24/2023   PLAN:  PT FREQUENCY: 2x/week  PT DURATION: 12 weeks  PLANNED INTERVENTIONS: Therapeutic exercises, Therapeutic activity, Neuromuscular re-education, Balance training, Gait training, Patient/Family education, Self Care, Stair training, Prosthetic training, DME instructions, Re-evaluation, and physical performance testing  PLAN FOR NEXT SESSION: continue gait training with cane including ramps & curbs. Instruct in stairs.  Continue standing balance activities.    Vladimir Faster, PT, DPT 03/10/2023, 1:38 PM

## 2023-03-11 ENCOUNTER — Ambulatory Visit: Payer: Self-pay | Admitting: Licensed Clinical Social Worker

## 2023-03-11 NOTE — Patient Instructions (Signed)
Visit Information  Thank you for taking time to visit with me today. Please don't hesitate to contact me if I can be of assistance to you.   Following are the goals we discussed today:   Goals Addressed               This Visit's Progress     Patient Stated he is concerned about finances. sometimes it is hard to pay monthly bills (pt-stated)        Interventions:  LCSW spoke via phone with [name] about client needs Discussed client dialysis treatments he receives 3 times weekly. He drives himself  to and from dialysis treatments Reviewed medication procurement.  Discussed pain issues of client Reviewed family support. He has support from his spouse, Banjamin Whitten. He and [name] discuss financial plans and discuss how to pay monthly bills due Discussed ramp usage. He said ramp at home is very useful and helpful to him. Discussed uses of wheelchair for ambulation. Discussed use of stair lift to go up and down stairs at his home  Discussed mood of client. He said  he has stress related to finances and paying monthly bills. He said stair lift was expensive. He has numerous doctor appointments and copays are expensive. He said ramp construction was expensive. Provided counseling support. Talked with [name] about budget planning. Suggested he might consult consumer credit counseling agency to talk with him about managing bills due each month to help plan for what bills to pay Client said he is having issues with his right leg. He said he plans to call his medical provider and schedule appointment with medical provider to discuss issues with his right leg Encouraged client to call LCSW for SW support as needed at [phone]         Our next appointment is by telephone on 05/12/23 at 10:00 AM   Please call the care guide team at [phone] if you need to cancel or reschedule your appointment.   If you are experiencing a Mental Health or Behavioral Health Crisis or need someone to talk  to, please go to [facility] [address] [place] [phone])   The patient verbalized understanding of instructions, educational materials, and care plan provided today and DECLINED offer to receive copy of patient instructions, educational materials, and care plan.   The patient has been provided with contact information for the care management team and has been advised to call with any health related questions or concerns.   Kelton Pillar.Rylend Pietrzak MSW, LCSW Licensed Visual merchandiser [facility] Care Management [phone]

## 2023-03-11 NOTE — Patient Outreach (Signed)
  Care Coordination   Follow Up Visit Note   03/11/2023 Name: [name] MRN: [id number] DOB: 11/30/1965  [name] is a 57 y.o. year old male who sees [name], MD for primary care. I spoke with  [name] by phone today.  What matters to the patients health and wellness today?  Client has stress related to managing finances    Goals Addressed               This Visit's Progress     Patient Stated he is concerned about finances. sometimes it is hard to pay monthly bills (pt-stated)        Interventions:  LCSW spoke via phone with [name] about client needs Discussed client dialysis treatments he receives 3 times weekly. He drives himself  to and from dialysis treatments Reviewed medication procurement.  Discussed pain issues of client Reviewed family support. He has support from his spouse, [name]. He and [name] discuss financial plans and discuss how to pay monthly bills due Discussed ramp usage. He said ramp at home is very useful and helpful to him. Discussed uses of wheelchair for ambulation. Discussed use of stair lift to go up and down stairs at his home  Discussed mood of client. He said  he has stress related to finances and paying monthly bills. He said stair lift was expensive. He has numerous doctor appointments and copays are expensive. He said ramp construction was expensive. Provided counseling support. Talked with [name] about budget planning. Suggested he might consult consumer credit counseling agency to talk with him about managing bills due each month to help plan for what bills to pay Client said he is having issues with his right leg. He said he plans to call his medical provider and schedule appointment with medical provider to discuss issues with his right leg Encouraged client to call LCSW for SW support as needed at [phone]         SDOH assessments and interventions completed:  Yes  SDOH Interventions Today    Flowsheet Row  Most Recent Value  SDOH Interventions   Depression Interventions/Treatment  Counseling  Physical Activity Interventions Other (Comments)  [has prosthetic device he uses to help him walk]  Stress Interventions Provide Counseling  [client has stress related to finances and related to paying monthly bills]        Care Coordination Interventions:  Yes, provided    Interventions Today    Flowsheet Row Most Recent Value  Chronic Disease   Chronic disease during today's visit Other  [spoke with client about client needs]  General Interventions   General Interventions Discussed/Reviewed General Interventions Discussed, Community Resources  [discussed program resources]  Exercise Interventions   Exercise Discussed/Reviewed Physical Activity  [uses prosthetic device as needed. uses wheelchair as needed]  Education Interventions   Education Provided Provided Education  Provided Verbal Education On [employer]  Mental Health Interventions   Mental Health Discussed/Reviewed Anxiety, Coping Strategies  [discussed mood of client. he has stress related to managing finances]  Pharmacy Interventions   Pharmacy Dicussed/Reviewed Pharmacy Topics Discussed  [client and LCSW discussed assistance client receives with cost of one of his medications]       Follow up plan: Follow up call scheduled for 05/12/23 at 10:00 AM    Encounter Outcome:  Pt. Visit Completed   Kelton Pillar.Dereon Corkery MSW, LCSW Licensed Visual merchand[employer] [facility] [phone]

## 2023-03-12 ENCOUNTER — Ambulatory Visit (INDEPENDENT_AMBULATORY_CARE_PROVIDER_SITE_OTHER): Payer: Medicare Other | Admitting: Physical Therapy

## 2023-03-12 ENCOUNTER — Encounter: Payer: Self-pay | Admitting: Physical Therapy

## 2023-03-12 DIAGNOSIS — M6281 Muscle weakness (generalized): Secondary | ICD-10-CM

## 2023-03-12 DIAGNOSIS — M79662 Pain in left lower leg: Secondary | ICD-10-CM | POA: Diagnosis not present

## 2023-03-12 DIAGNOSIS — R2689 Other abnormalities of gait and mobility: Secondary | ICD-10-CM

## 2023-03-12 DIAGNOSIS — R2681 Unsteadiness on feet: Secondary | ICD-10-CM

## 2023-03-12 NOTE — Therapy (Signed)
OUTPATIENT PHYSICAL THERAPY PROSTHETICS TREATMENT   Patient Name: [name] MRN: [id number] DOB:July 19, 1966, 57 y.o., male Today's Date: 03/12/2023  PCP: [name], MD REFERRING PROVIDER: Marcello Fennel, MD  END OF SESSION:  PT End of Session - 03/12/23 0843     Visit Number 7    Number of Visits 15    Date for PT Re-Evaluation 05/14/23    Authorization Type Medicare & BCBS    Progress Note Due on Visit 10    PT Start Time 0843    PT Stop Time 0928    PT Time Calculation (min) 45 min    Equipment Utilized During Treatment Gait belt    Activity Tolerance Patient tolerated treatment well;Patient limited by pain    Behavior During Therapy [facility] for tasks assessed/performed               Past Medical History:  Diagnosis Date   Acute osteomyelitis of right foot (HCC) 05/15/2019   Anemia 06/23/2019   Aortic stenosis    Bursitis    right shoulder   Complication of anesthesia    Coronary artery disease    Diabetes with renal manifestations(250.4) 08/23/2013   Diabetic infection of right foot (HCC)    Diabetic retinopathy associated with type 2 diabetes mellitus (HCC) 06/23/2019   ESRD on hemodialysis (HCC) 07/18/2011   Essential hypertension 10/18/2007   Qualifier: Diagnosis of  By: Everardo All MD, Gregary Signs A    History of blood transfusion    HYPERLIPIDEMIA 05/16/2008   Qualifier: Diagnosis of  By: Jonny Ruiz MD, Len Blalock    MRSA INFECTION 10/18/2007   Qualifier: Diagnosis of  By: Tyrone Apple, Lucy     Other forms of retinal detachment(361.89) 06/23/2019   PAD (peripheral artery disease) (HCC)    PONV (postoperative nausea and vomiting)    Prolonged QT interval    Pulmonary hypertension (HCC)    Secondary hyperparathyroidism (HCC) 06/23/2019   Sepsis (HCC) 05/14/2019   Past Surgical History:  Procedure Laterality Date   ABDOMINAL AORTOGRAM N/A 05/17/2019   Procedure: ABDOMINAL AORTOGRAM;  Surgeon: Sherren Kerns, MD;  Location: [facility] INVASIVE CV LAB;  Service:  Cardiovascular;  Laterality: N/A;   AMPUTATION TOE Right 05/14/2019   Procedure: PARTIAL AMPUTATION SECOND TOE RIGHT FOOT;  Surgeon: Park Liter, DPM;  Location: [facility] OR;  Service: Podiatry;  Laterality: Right;   AMPUTATION TOE Right 05/23/2019   Procedure: AMPUTATION OF SECOND TOE METATARSAL PHALANGEAL JOINT;  Surgeon: Park Liter, DPM;  Location: [facility] OR;  Service: Podiatry;  Laterality: Right;   AMPUTATION TOE Right 05/27/2019   Procedure: Amputation Toe;  Surgeon: Park Liter, DPM;  Location: [facility] OR;  Service: Podiatry;  Laterality: Right;  right third toe   APPLICATION OF WOUND VAC Right 05/27/2019   Procedure: Application Of Wound Vac;  Surgeon: Park Liter, DPM;  Location: [facility] OR;  Service: Podiatry;  Laterality: Right;   AV FISTULA PLACEMENT  06-18-11   Right brachiocephalic AVF   BONE BIOPSY Right 05/14/2019   Procedure: OPEN SUPERFICIAL BONE BIOPSY GREAT TOE;  Surgeon: Park Liter, DPM;  Location: [facility] OR;  Service: Podiatry;  Laterality: Right;   CARDIAC CATHETERIZATION     CORONARY BALLOON ANGIOPLASTY N/A 09/24/2020   Procedure: CORONARY BALLOON ANGIOPLASTY;  Surgeon: Swaziland, Peter M, MD;  Location: [facility] INVASIVE CV LAB;  Service: Cardiovascular;  Laterality: N/A;   CORONARY PRESSURE/FFR STUDY N/A 09/24/2020   Procedure: INTRAVASCULAR PRESSURE WIRE/FFR STUDY;  Surgeon: Swaziland, Peter M, MD;  Location: [facility] CV [facility];  Service: Cardiovascular;  Laterality: N/A;   CORONARY STENT INTERVENTION N/A 09/24/2020   Procedure: CORONARY STENT INTERVENTION;  Surgeon: Swaziland, Peter M, MD;  Location: [facility] INVASIVE CV LAB;  Service: Cardiovascular;  Laterality: N/A;   EYE SURGERY     left eye for Laser, diabetic retinopathy   FISTULOGRAM Right 11/06/2011   Procedure: FISTULOGRAM;  Surgeon: Fransisco Hertz, MD;  Location: [facility] CATH LAB;  Service: Cardiovascular;  Laterality: Right;   HEMATOMA EVACUATION Right Feb. 25, 2014   HEMATOMA EVACUATION Right 12/14/2012   Procedure: EVACUATION HEMATOMA;   Surgeon: Fransisco Hertz, MD;  Location: [facility] OR;  Service: Vascular;  Laterality: Right;   INCISION AND DRAINAGE Right 05/23/2019   Procedure: INCISION AND DRAINAGE OF RIGHT FOOT DEEP SPACE ABSCESS;  Surgeon: Park Liter, DPM;  Location: [facility] OR;  Service: Podiatry;  Laterality: Right;   INJECTION OF SILICONE OIL Left 01/09/2023   Procedure: INJECTION OF SILICONE OIL;  Surgeon: Edmon Crape, MD;  Location: [facility] OR;  Service: Ophthalmology;  Laterality: Left;   INSERTION OF DIALYSIS CATHETER  12/14/2012   Procedure: INSERTION OF DIALYSIS CATHETER;  Surgeon: Fransisco Hertz, MD;  Location: [facility] OR;  Service: Vascular;;   INSERTION OF DIALYSIS CATHETER Right 01/07/2021   Procedure: INSERTION OF DIALYSIS CATHETER;  Surgeon: Sherren Kerns, MD;  Location: [facility] OR;  Service: Vascular;  Laterality: Right;   INSERTION OF DIALYSIS CATHETER Left 01/08/2021   Procedure: INSERTION OF LEFT INTERNAL JUGULAR DIALYSIS CATHETER, tunneled;  Surgeon: Chuck Hint, MD;  Location: [facility] OR;  Service: Vascular;  Laterality: Left;   IRRIGATION AND DEBRIDEMENT FOOT Right 05/25/2019   Procedure: INCISION AND DRAINAGE FOOT;  Surgeon: Park Liter, DPM;  [facility]: [facility] OR;  Service: Podiatry;  Laterality: Right;   IRRIGATION AND DEBRIDEMENT FOOT Right 05/27/2019   Procedure: IRRIGATION AND DEBRIDEMENT FOOT;  Surgeon: Park Liter, DPM;  Location: [facility] OR;  Service: Podiatry;  Laterality: Right;   LASER PHOTO ABLATION Left 01/09/2023   Procedure: LASER PHOTO ABLATION;  Surgeon: Edmon Crape, MD;  Location: [facility] OR;  Service: Ophthalmology;  Laterality: Left;   LEFT HEART CATH AND CORONARY ANGIOGRAPHY N/A 09/24/2020   Procedure: LEFT HEART CATH AND CORONARY ANGIOGRAPHY;  Surgeon: Swaziland, Peter M, MD;  Location: [facility] INVASIVE CV LAB;  Service: Cardiovascular;  Laterality: N/A;   LOWER EXTREMITY ANGIOGRAPHY Bilateral 05/17/2019   Procedure: LOWER EXTREMITY ANGIOGRAPHY;  Surgeon: Sherren Kerns, MD;  Location: [facility] INVASIVE CV LAB;   Service: Cardiovascular;  Laterality: Bilateral;   PARS PLANA VITRECTOMY Left 01/09/2023   Procedure: PARS PLANA VITRECTOMY WITH 25 GAUGE AND REPAIR OF RETINAL DETACHMENT;  Surgeon: Edmon Crape, MD;  Location: [facility] OR;  Service: Ophthalmology;  Laterality: Left;   REMOVAL OF A DIALYSIS CATHETER Right 01/08/2021   Procedure: REMOVAL OF TEMPORARY DIALYSIS CATHETER, LEFT FEMORAL VEIN;  Surgeon: Chuck Hint, MD;  Location: [facility] OR;  Service: Vascular;  Laterality: Right;   REVISON OF ARTERIOVENOUS FISTULA Right 12/14/2012   Procedure: REVISON OF upper arm ARTERIOVENOUS FISTULA using 39mmx10cm gortex graft;  Surgeon: Fransisco Hertz, MD;  Location: [facility] OR;  Service: Vascular;  Laterality: Right;   REVISON OF ARTERIOVENOUS FISTULA Right 12/27/2020   Procedure: RIGHT UPPER EXTREMITY ARTERIOVENOUS FISTULA REVISiON;  Surgeon: Leonie Douglas, MD;  Location: [facility] OR;  Service: Vascular;  Laterality: Right;  PERIPHERAL NERVE BLOCK   RIGHT/LEFT HEART CATH AND CORONARY ANGIOGRAPHY N/A 08/07/2020   Procedure: RIGHT/LEFT HEART  CATH AND CORONARY ANGIOGRAPHY;  Surgeon: Swaziland, Peter M, MD;  Location: [facility] INVASIVE CV LAB;  Service: Cardiovascular;  Laterality: N/A;   SOFT TISSUE MASS EXCISION     Right arm, Left leg  for MRSA infection   Patient Active Problem List   Diagnosis Date Noted   ACS (acute coronary syndrome) (HCC) 09/12/2021   Prolonged QT interval 09/12/2021   Unstable angina (HCC) 08/20/2021   Nuclear sclerotic cataract of right eye 06/11/2021   Nuclear sclerotic cataract of left eye 06/11/2021   Posterior subcapsular age-related cataract, left eye 06/11/2021   Stable treated proliferative diabetic retinopathy of left eye determined by examination associated with type 2 diabetes mellitus (HCC) 06/11/2021   Right epiretinal membrane 06/11/2021   Stable treated proliferative diabetic retinopathy of right eye determined by examination associated with type 2 diabetes mellitus (HCC) 06/11/2021    Hyperkalemia 01/09/2021   ESRD (end stage renal disease) (HCC)    Dialysis AV fistula malfunction, initial encounter (HCC) 01/07/2021   NSTEMI (non-ST elevated myocardial infarction) (HCC) 09/21/2020   3-vessel coronary artery disease 08/09/2020   Pulmonary hypertension, unspecified (HCC) 08/09/2020   Pancytopenia (HCC) 08/06/2020   Allergy, unspecified, subsequent encounter 06/28/2020   Anaphylactic shock, unspecified, subsequent encounter 06/28/2020   Lymphedema of both lower extremities 12/29/2019   Hyperphosphatemia 12/11/2019   Encounter for post surgical wound check 08/16/2019   Post-op pain 08/16/2019   S/P transmetatarsal amputation of foot, right (HCC) 08/16/2019   Unspecified protein-calorie malnutrition (HCC) 07/12/2019   PAD (peripheral artery disease) (HCC) 06/30/2019   Diabetic retinopathy associated with type 2 diabetes mellitus (HCC) 06/23/2019   Other forms of retinal detachment(361.89) 06/23/2019   Secondary hyperparathyroidism (HCC) 06/23/2019   Osteomyelitis, unspecified (HCC) 05/31/2019   Gangrene of toe of right foot (HCC)    Ulcer of right foot, with necrosis of bone (HCC)    Abscess of right foot    Diabetic infection of right foot (HCC)    Ulcer of right second toe with necrosis of bone (HCC)    Cellulitis of right foot 05/22/2019   Acute osteomyelitis of right foot (HCC) 05/15/2019   Osteomyelitis of great toe of right foot (HCC) 05/14/2019   Diabetic foot ulcer- right second toe 05/14/2019   Sepsis (HCC) 05/14/2019   Hypothyroidism, unspecified 01/11/2018   Hypercalcemia 06/14/2016   Dyslipidemia 05/11/2015   Hyperglycemia 05/05/2015   Diarrhea, unspecified 02/01/2015   Fever, unspecified 02/01/2015   Pain, unspecified 02/01/2015   Pruritus, unspecified 02/01/2015   Coagulation defect, unspecified (HCC) 07/07/2014   Encounter for removal of sutures 05/30/2014   Encounter for immunization 04/19/2014   Chest pain 08/23/2013   Type 2 diabetes mellitus  (HCC) 08/23/2013   Hemodialysis status (HCC) 08/23/2013   Chronic kidney disease, stage V (HCC) 08/23/2013   Obesity, unspecified 08/23/2013   Complication of vascular dialysis catheter 12/15/2012   Other fluid overload 12/07/2012   Other specified complication of vascular prosthetic devices, implants and grafts, subsequent encounter 08/06/2012   ESRD on dialysis (HCC) 07/18/2011   Pure hypercholesterolemia 06/23/2011   Iron deficiency anemia, unspecified 06/21/2011   HYPOGLYCEMIA, UNSPECIFIED 11/13/2008   RENAL INSUFFICIENCY 07/31/2008   FOOT ULCER, LEFT 06/29/2008   HYPERLIPIDEMIA 05/16/2008   MRSA INFECTION 10/18/2007   Essential hypertension 10/18/2007    ONSET DATE: 02/12/2023 prosthesis delivery  REFERRING DIAG: 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    PERTINENT HISTORY: left BKA, right TMA, DM2, PAD, CAD, MI, ESRD on HD, moderate AS, HTN, hypothyroidism, anemia, CHF  PAIN:  Are you having pain?   Yes: NPRS scale: this morning 0/10 Pain location: low back, muscles of both LEs Pain description: dull ache, sciatica pain Aggravating factors: walking with lean,  Relieving factors: exercises  PRECAUTIONS: Fall and Other: No BP RUE  WEIGHT BEARING RESTRICTIONS: No  FALLS: Has patient fallen in last 6 months? No  LIVING ENVIRONMENT: Lives with: lives with their spouse Lives in: House Home Access: Ramped entrance Home layout: Two level, 1/2 bath on main level, and stair lift Stairs: Yes: Internal: 15 steps; on left going up and External: 2-5 steps; can reach both Has following equipment at  home: Environmental consultant - 2 wheeled, Wheelchair (manual), [employer], Grab bars, and Ramped entry  PLOF: Independent with household mobility without device and Independent with community mobility without device  PATIENT GOALS:   to use prosthesis to be active including gardening, his yard has hill,   OBJECTIVE:  COGNITION: Overall cognitive status: Within functional limits for tasks assessed   SENSATION: WFL  POSTURE:  Eval / 02/17/2023:  rounded shoulders, forward head, flexed trunk , and weight shift right  LOWER EXTREMITY ROM:  ROM P:passive  A:active Right eval Left eval  Hip flexion    Hip extension Standing -15* Standing  -15*  Hip abduction    Hip adduction    Hip internal rotation    Hip external rotation    Knee flexion    Knee extension    Ankle dorsiflexion    Ankle plantarflexion    Ankle inversion    Ankle eversion     (Blank rows = not tested)  LOWER EXTREMITY MMT:  MMT Right eval Left eval  Hip flexion    Hip extension Functional 4/5 Functional 4/5  Hip abduction Functional 4/5 Functional 4/5  Hip adduction    Hip internal rotation    Hip external rotation    Knee flexion Functional 4/5 Functional 4/5  Knee extension Functional 4/5 Functional 4/5  Ankle dorsiflexion 3-/5 NA  Ankle plantarflexion  NA  Ankle inversion  NA  Ankle eversion  NA  (Blank rows = not tested)  TRANSFERS: Eval / 02/17/2023: Sit to stand: SBA requires use of armrests from 18" chair using back of legs against chair & RW to stabilize Stand to sit: SBA requires use of armrests from 18" chair using back of legs against chair & RW to stabilize  GAIT: Eval / 02/17/2023: Gait pattern: step to pattern, decreased step length- Right, decreased stance time- Left, decreased hip/knee flexion- Left, decreased ankle dorsiflexion- Right, Left hip hike, Right foot flat, genu recurvatum- Left, antalgic, trendelenburg, lateral hip instability, trunk flexed, abducted- Left, and poor foot  clearance- Right excessive BUE weight bearing on RW Distance walked: 100' Assistive device utilized: Environmental consultant - 2 wheeled and TTA prosthesis Level of assistance: SBA  FUNCTIONAL TESTs:  Eval / 4/30/2024Sharlene Motts Balance Scale: 11/56    CURRENT PROSTHETIC WEAR ASSESSMENT: Eval /  02/17/2023: Patient is dependent with: skin check, residual limb care, care of non-amputated limb, prosthetic cleaning, ply sock cleaning, correct ply sock adjustment, proper wear schedule/adjustment, and proper weight-bearing schedule/adjustment Donning prosthesis: SBA Doffing prosthesis: SBA Prosthetic wear tolerance: 4  hours, 1x/day, 4 of 6 days since delivery Prosthetic weight bearing tolerance: 5 minutes with limb pain 8/10 Edema: pitting with 5 sec capillary refill Residual limb condition: cylindrical shape, no open areas, limited to no hair growth, scar dryness, normal color & temperature Prosthetic description: silicon liner with pin lock suspension, total contact socket with flexible inner socket, dynamic response foot K code/activity level with prosthetic use: Level 3    TODAY'S TREATMENT:                                                                                                                             DATE:  03/12/2023: Prosthetic Training with Transtibial Amputation: No changes to patient's residual limb.  Patient continues to wear prosthesis all awake hours drying twice during the day. PT reviewed engaging hamstring muscle prior to standing and walking to decrease how much the distal tibia pulled forward into the anterior socket. Worked on sit to and from stand using UEs to push-up but not touching rolling walker for stabilization: engaging hamstrings prior to standing up motion, technique with weight shift over feet prior to erecting trunk, standing up from rolling desk chair placed 2 inches in front of a wall for safety.  Patient improved technique with repetition and PT instruction. Worked on  gait with rolling walker: Fluency of motion keeping rolling walker moving, looking forward / not staring at the ground to facilitate an upright posture with less upper extremity weightbearing and engaging hamstrings with initial contact on prosthesis.  Patient verbalized return demonstration understanding how to work on this outside of therapy.  He reports less anterior limb pain. Upon patient request PT educated patient in traveling including to the beach and use of prosthesis with water activities like a pool.  Patient verbalized understanding    03/10/2023: Prosthetic Training with Transtibial Amputation: PT reviewed donning liner without folding skin. PT recommended increasing wear to all awake hours drying limb/liner twice and prn.  Pt verbalized understanding.  Pt amb 75' and 50' X 2 with cane with minA.  Therapeutic Exercise: Leg press BLEs 100# 15 reps 2 sets. Nustep seat 12 level 5 with BLEs/BUEs 7 min. PT cued on set up. Seated chest press machine 10# 10 reps & row 15# 10 reps. PT cued on use & set-up. Knee ext machine 10# 10 reps  Knee flex machine 20# 10 reps Pt verbalized understanding of above equipment at gym.      03/05/2023: Prosthetic Training with Transtibial Amputation: PT educated pt on changing shoes, skin moisturizers and checking for sweat. Pt verbalized understanding.  PT educated in hamstring set seated, prior to sit/stand and upon heel strike in gait. Pt performed 10 reps ea with tactile & verbal cues. Pt amb 100' X 4 with cane with minA with cues on LLE rotation with balance reactions.     HOME EXERCISE PROGRAM:  ASSESSMENT: CLINICAL IMPRESSION: PT instructed and worked on sit to stand technique which she improved with repetition  including from a rolling desk chair.  Patient improved fluency and motion of gait with PT instruction.  Patient continues to benefit from skilled PT.  OBJECTIVE IMPAIRMENTS: Abnormal gait, decreased activity tolerance, decreased  balance, decreased knowledge of condition, decreased knowledge of use of DME, decreased mobility, difficulty walking, decreased strength, increased edema, impaired flexibility, postural dysfunction, prosthetic dependency , and pain.   ACTIVITY LIMITATIONS: carrying, lifting, bending, standing, squatting, stairs, transfers, and locomotion level  PARTICIPATION LIMITATIONS: meal prep, cleaning, driving, community activity, and yard work  PERSONAL FACTORS: Education, Fitness, Past/current experiences, Time since onset of injury/illness/exacerbation, and 3+ comorbidities: see PMH  are also affecting patient's functional outcome.   REHAB POTENTIAL: Good  CLINICAL DECISION MAKING: Evolving/moderate complexity  EVALUATION COMPLEXITY: Moderate   GOALS: Goals reviewed with patient? Yes  SHORT TERM GOALS: Target date: 03/19/2023  Patient donnes prosthesis modified independent & verbalizes proper cleaning. Baseline: SEE OBJECTIVE DATA Goal status:  Ongoing 02/24/2023 2.  Patient tolerates prosthesis >12 hrs total /day without skin issues or limb pain <5/10 after standing. Baseline: SEE OBJECTIVE DATA Goal status: Ongoing 02/24/2023  3.  Patient able to stand 30 seconds without UE support with supervision. Baseline: SEE OBJECTIVE DATA Goal status: Ongoing 02/24/2023  4. Patient ambulates 66' with RW & prosthesis with supervision. Baseline: SEE OBJECTIVE DATA Goal status: Ongoing 02/24/2023  5. Patient negotiates ramps & curbs with RW & prosthesis with supervision. Baseline: SEE OBJECTIVE DATA Goal status: Ongoing 02/24/2023  LONG TERM GOALS: Target date: 05/14/2023  Patient demonstrates & verbalized understanding of prosthetic care to enable safe utilization of prosthesis. Baseline: SEE OBJECTIVE DATA Goal status: Ongoing 02/24/2023  Patient tolerates prosthesis wear >90% of awake hours without skin or limb pain issues. Baseline: SEE OBJECTIVE DATA Goal status: Ongoing 02/24/2023  Berg Balance  >/= 30 / 56 to indicate lower fall risk Baseline: SEE OBJECTIVE DATA Goal status: Ongoing 02/24/2023  Patient ambulates >500' with prosthesis & LRAD independently Baseline: SEE OBJECTIVE DATA Goal status: Ongoing 02/24/2023  Patient negotiates ramps, curbs & stairs with single rail with prosthesis & LRAD independently. Baseline: SEE OBJECTIVE DATA Goal status: Ongoing 02/24/2023   PLAN:  PT FREQUENCY: 2x/week  PT DURATION: 12 weeks  PLANNED INTERVENTIONS: Therapeutic exercises, Therapeutic activity, Neuromuscular re-education, Balance training, Gait training, Patient/Family education, Self Care, Stair training, Prosthetic training, DME instructions, Re-evaluation, and physical performance testing  PLAN FOR NEXT SESSION:   gait training with cane including ramps & curbs. Instruct in stairs.  Continue standing balance activities.    Vladimir Faster, PT, DPT 03/12/2023, 11:29 AM

## 2023-03-17 ENCOUNTER — Ambulatory Visit (INDEPENDENT_AMBULATORY_CARE_PROVIDER_SITE_OTHER): Payer: Medicare Other | Admitting: Physical Therapy

## 2023-03-17 ENCOUNTER — Encounter: Payer: Medicare Other | Admitting: Physical Therapy

## 2023-03-17 ENCOUNTER — Encounter: Payer: Self-pay | Admitting: Physical Therapy

## 2023-03-17 DIAGNOSIS — R2689 Other abnormalities of gait and mobility: Secondary | ICD-10-CM | POA: Diagnosis not present

## 2023-03-17 DIAGNOSIS — M79662 Pain in left lower leg: Secondary | ICD-10-CM

## 2023-03-17 DIAGNOSIS — R2681 Unsteadiness on feet: Secondary | ICD-10-CM | POA: Diagnosis not present

## 2023-03-17 DIAGNOSIS — M6281 Muscle weakness (generalized): Secondary | ICD-10-CM

## 2023-03-17 NOTE — Therapy (Signed)
OUTPATIENT PHYSICAL THERAPY PROSTHETICS TREATMENT   Patient Name: [name] MRN: [id number] DOB:30-Dec-1965, 57 y.o., male Today's Date: 03/17/2023  PCP: [name], MD REFERRING PROVIDER: Marcello Fennel, MD  END OF SESSION:  PT End of Session - 03/17/23 1347     Visit Number 8    Number of Visits 15    Date for PT Re-Evaluation 05/14/23    Authorization Type Medicare & BCBS    Progress Note Due on Visit 10    PT Start Time 1345    PT Stop Time 1425    PT Time Calculation (min) 40 min    Equipment Utilized During Treatment Gait belt    Activity Tolerance Patient tolerated treatment well;Patient limited by pain    Behavior During Therapy [facility] for tasks assessed/performed               Past Medical History:  Diagnosis Date   Acute osteomyelitis of right foot (HCC) 05/15/2019   Anemia 06/23/2019   Aortic stenosis    Bursitis    right shoulder   Complication of anesthesia    Coronary artery disease    Diabetes with renal manifestations(250.4) 08/23/2013   Diabetic infection of right foot (HCC)    Diabetic retinopathy associated with type 2 diabetes mellitus (HCC) 06/23/2019   ESRD on hemodialysis (HCC) 07/18/2011   Essential hypertension 10/18/2007   Qualifier: Diagnosis of  By: Everardo All MD, Gregary Signs A    History of blood transfusion    HYPERLIPIDEMIA 05/16/2008   Qualifier: Diagnosis of  By: Jonny Ruiz MD, Len Blalock    MRSA INFECTION 10/18/2007   Qualifier: Diagnosis of  By: Tyrone Apple, Lucy     Other forms of retinal detachment(361.89) 06/23/2019   PAD (peripheral artery disease) (HCC)    PONV (postoperative nausea and vomiting)    Prolonged QT interval    Pulmonary hypertension (HCC)    Secondary hyperparathyroidism (HCC) 06/23/2019   Sepsis (HCC) 05/14/2019   Past Surgical History:  Procedure Laterality Date   ABDOMINAL AORTOGRAM N/A 05/17/2019   Procedure: ABDOMINAL AORTOGRAM;  Surgeon: Sherren Kerns, MD;  Location: [facility] INVASIVE CV LAB;  Service:  Cardiovascular;  Laterality: N/A;   AMPUTATION TOE Right 05/14/2019   Procedure: PARTIAL AMPUTATION SECOND TOE RIGHT FOOT;  Surgeon: Park Liter, DPM;  Location: [facility] OR;  Service: Podiatry;  Laterality: Right;   AMPUTATION TOE Right 05/23/2019   Procedure: AMPUTATION OF SECOND TOE METATARSAL PHALANGEAL JOINT;  Surgeon: Park Liter, DPM;  Location: [facility] OR;  Service: Podiatry;  Laterality: Right;   AMPUTATION TOE Right 05/27/2019   Procedure: Amputation Toe;  Surgeon: Park Liter, DPM;  Location: [facility] OR;  Service: Podiatry;  Laterality: Right;  right third toe   APPLICATION OF WOUND VAC Right 05/27/2019   Procedure: Application Of Wound Vac;  Surgeon: Park Liter, DPM;  Location: [facility] OR;  Service: Podiatry;  Laterality: Right;   AV FISTULA PLACEMENT  06-18-11   Right brachiocephalic AVF   BONE BIOPSY Right 05/14/2019   Procedure: OPEN SUPERFICIAL BONE BIOPSY GREAT TOE;  Surgeon: Park Liter, DPM;  Location: [facility] OR;  Service: Podiatry;  Laterality: Right;   CARDIAC CATHETERIZATION     CORONARY BALLOON ANGIOPLASTY N/A 09/24/2020   Procedure: CORONARY BALLOON ANGIOPLASTY;  Surgeon: Swaziland, Peter M, MD;  Location: [facility] INVASIVE CV LAB;  Service: Cardiovascular;  Laterality: N/A;   CORONARY PRESSURE/FFR STUDY N/A 09/24/2020   Procedure: INTRAVASCULAR PRESSURE WIRE/FFR STUDY;  Surgeon: Swaziland, Peter M, MD;  Location: [facility] INV[facility] CV LAB;  Service: Cardiovascular;  Laterality: N/A;   CORONARY STENT INTERVENTION N/A 09/24/2020   Procedure: CORONARY STENT INTERVENTION;  Surgeon: Swaziland, Peter M, MD;  Location: [facility] INVASIVE CV LAB;  Service: Cardiovascular;  Laterality: N/A;   EYE SURGERY     left eye for Laser, diabetic retinopathy   FISTULOGRAM Right 11/06/2011   Procedure: FISTULOGRAM;  Surgeon: Fransisco Hertz, MD;  Location: [facility] CATH LAB;  Service: Cardiovascular;  Laterality: Right;   HEMATOMA EVACUATION Right Feb. 25, 2014   HEMATOMA EVACUATION Right 12/14/2012   Procedure: EVACUATION HEMATOMA;   Surgeon: Fransisco Hertz, MD;  Location: [facility] OR;  Service: Vascular;  Laterality: Right;   INCISION AND DRAINAGE Right 05/23/2019   Procedure: INCISION AND DRAINAGE OF RIGHT FOOT DEEP SPACE ABSCESS;  Surgeon: Park Liter, DPM;  Location: [facility] OR;  Service: Podiatry;  Laterality: Right;   INJECTION OF SILICONE OIL Left 01/09/2023   Procedure: INJECTION OF SILICONE OIL;  Surgeon: Edmon Crape, MD;  Location: [facility] OR;  Service: Ophthalmology;  Laterality: Left;   INSERTION OF DIALYSIS CATHETER  12/14/2012   Procedure: INSERTION OF DIALYSIS CATHETER;  Surgeon: Fransisco Hertz, MD;  Location: [facility] OR;  Service: Vascular;;   INSERTION OF DIALYSIS CATHETER Right 01/07/2021   Procedure: INSERTION OF DIALYSIS CATHETER;  Surgeon: Sherren Kerns, MD;  Location: [facility] OR;  Service: Vascular;  Laterality: Right;   INSERTION OF DIALYSIS CATHETER Left 01/08/2021   Procedure: INSERTION OF LEFT INTERNAL JUGULAR DIALYSIS CATHETER, tunneled;  Surgeon: Chuck Hint, MD;  Location: [facility] OR;  Service: Vascular;  Laterality: Left;   IRRIGATION AND DEBRIDEMENT FOOT Right 05/25/2019   Procedure: INCISION AND DRAINAGE FOOT;  Surgeon: Park Liter, DPM;  [facility]: [facility];  Service: Podiatry;  Laterality: Right;   IRRIGATION AND DEBRIDEMENT FOOT Right 05/27/2019   Procedure: IRRIGATION AND DEBRIDEMENT FOOT;  Surgeon: Park Liter, DPM;  Location: [facility] OR;  Service: Podiatry;  Laterality: Right;   LASER PHOTO ABLATION Left 01/09/2023   Procedure: LASER PHOTO ABLATION;  Surgeon: Edmon Crape, MD;  Location: [facility] OR;  Service: Ophthalmology;  Laterality: Left;   LEFT HEART CATH AND CORONARY ANGIOGRAPHY N/A 09/24/2020   Procedure: LEFT HEART CATH AND CORONARY ANGIOGRAPHY;  Surgeon: Swaziland, Peter M, MD;  Location: [facility] INVASIVE CV LAB;  Service: Cardiovascular;  Laterality: N/A;   LOWER EXTREMITY ANGIOGRAPHY Bilateral 05/17/2019   Procedure: LOWER EXTREMITY ANGIOGRAPHY;  Surgeon: Sherren Kerns, MD;  [facility]: [facility] INVASIVE CV LAB;   Service: Cardiovascular;  Laterality: Bilateral;   PARS PLANA VITRECTOMY Left 01/09/2023   Procedure: PARS PLANA VITRECTOMY WITH 25 GAUGE AND REPAIR OF RETINAL DETACHMENT;  Surgeon: Edmon Crape, MD;  Location: [facility] OR;  Service: Ophthalmology;  Laterality: Left;   REMOVAL OF A DIALYSIS CATHETER Right 01/08/2021   Procedure: REMOVAL OF TEMPORARY DIALYSIS CATHETER, LEFT FEMORAL VEIN;  Surgeon: Chuck Hint, MD;  Location: [facility] OR;  Service: Vascular;  Laterality: Right;   REVISON OF ARTERIOVENOUS FISTULA Right 12/14/2012   Procedure: REVISON OF upper arm ARTERIOVENOUS FISTULA using 3mmx10cm gortex graft;  Surgeon: Fransisco Hertz, MD;  Location: [facility] OR;  Service: Vascular;  Laterality: Right;   REVISON OF ARTERIOVENOUS FISTULA Right 12/27/2020   Procedure: RIGHT UPPER EXTREMITY ARTERIOVENOUS FISTULA REVISiON;  Surgeon: Leonie Douglas, MD;  Location: [facility] OR;  Service: Vascular;  Laterality: Right;  PERIPHERAL NERVE BLOCK   RIGHT/LEFT HEART CATH AND CORONARY ANGIOGRAPHY N/A 08/07/2020   Procedure: RIGHT/LEFT HEART  CATH AND CORONARY ANGIOGRAPHY;  Surgeon: Swaziland, Peter M, MD;  Location: [facility] INVASIVE CV LAB;  Service: Cardiovascular;  Laterality: N/A;   SOFT TISSUE MASS EXCISION     Right arm, Left leg  for MRSA infection   Patient Active Problem List   Diagnosis Date Noted   ACS (acute coronary syndrome) (HCC) 09/12/2021   Prolonged QT interval 09/12/2021   Unstable angina (HCC) 08/20/2021   Nuclear sclerotic cataract of right eye 06/11/2021   Nuclear sclerotic cataract of left eye 06/11/2021   Posterior subcapsular age-related cataract, left eye 06/11/2021   Stable treated proliferative diabetic retinopathy of left eye determined by examination associated with type 2 diabetes mellitus (HCC) 06/11/2021   Right epiretinal membrane 06/11/2021   Stable treated proliferative diabetic retinopathy of right eye determined by examination associated with type 2 diabetes mellitus (HCC) 06/11/2021    Hyperkalemia 01/09/2021   ESRD (end stage renal disease) (HCC)    Dialysis AV fistula malfunction, initial encounter (HCC) 01/07/2021   NSTEMI (non-ST elevated myocardial infarction) (HCC) 09/21/2020   3-vessel coronary artery disease 08/09/2020   Pulmonary hypertension, unspecified (HCC) 08/09/2020   Pancytopenia (HCC) 08/06/2020   Allergy, unspecified, subsequent encounter 06/28/2020   Anaphylactic shock, unspecified, subsequent encounter 06/28/2020   Lymphedema of both lower extremities 12/29/2019   Hyperphosphatemia 12/11/2019   Encounter for post surgical wound check 08/16/2019   Post-op pain 08/16/2019   S/P transmetatarsal amputation of foot, right (HCC) 08/16/2019   Unspecified protein-calorie malnutrition (HCC) 07/12/2019   PAD (peripheral artery disease) (HCC) 06/30/2019   Diabetic retinopathy associated with type 2 diabetes mellitus (HCC) 06/23/2019   Other forms of retinal detachment(361.89) 06/23/2019   Secondary hyperparathyroidism (HCC) 06/23/2019   Osteomyelitis, unspecified (HCC) 05/31/2019   Gangrene of toe of right foot (HCC)    Ulcer of right foot, with necrosis of bone (HCC)    Abscess of right foot    Diabetic infection of right foot (HCC)    Ulcer of right second toe with necrosis of bone (HCC)    Cellulitis of right foot 05/22/2019   Acute osteomyelitis of right foot (HCC) 05/15/2019   Osteomyelitis of great toe of right foot (HCC) 05/14/2019   Diabetic foot ulcer- right second toe 05/14/2019   Sepsis (HCC) 05/14/2019   Hypothyroidism, unspecified 01/11/2018   Hypercalcemia 06/14/2016   Dyslipidemia 05/11/2015   Hyperglycemia 05/05/2015   Diarrhea, unspecified 02/01/2015   Fever, unspecified 02/01/2015   Pain, unspecified 02/01/2015   Pruritus, unspecified 02/01/2015   Coagulation defect, unspecified (HCC) 07/07/2014   Encounter for removal of sutures 05/30/2014   Encounter for immunization 04/19/2014   Chest pain 08/23/2013   Type 2 diabetes mellitus  (HCC) 08/23/2013   Hemodialysis status (HCC) 08/23/2013   Chronic kidney disease, stage V (HCC) 08/23/2013   Obesity, unspecified 08/23/2013   Complication of vascular dialysis catheter 12/15/2012   Other fluid overload 12/07/2012   Other specified complication of vascular prosthetic devices, implants and grafts, subsequent encounter 08/06/2012   ESRD on dialysis (HCC) 07/18/2011   Pure hypercholesterolemia 06/23/2011   Iron deficiency anemia, unspecified 06/21/2011   HYPOGLYCEMIA, UNSPECIFIED 11/13/2008   RENAL INSUFFICIENCY 07/31/2008   FOOT ULCER, LEFT 06/29/2008   HYPERLIPIDEMIA 05/16/2008   MRSA INFECTION 10/18/2007   Essential hypertension 10/18/2007    ONSET DATE: 02/12/2023 prosthesis delivery  REFERRING DIAG: 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      PERTINENT HISTORY: left BKA, right TMA, DM2, PAD, CAD, MI, ESRD on HD, moderate AS, HTN, hypothyroidism, anemia, CHF  PAIN:  Are you having pain?   Yes: NPRS scale: this morning 0/10 Pain location: low back, muscles of both LEs Pain description: dull ache, sciatica pain Aggravating factors: walking with lean,  Relieving factors: exercises  PRECAUTIONS: Fall and Other: No BP RUE  WEIGHT BEARING RESTRICTIONS: No  FALLS: Has patient fallen in last 6 months? No  LIVING ENVIRONMENT: Lives with: lives with their spouse Lives in: House Home Access: Ramped entrance Home layout: Two level, 1/2 bath on main level, and stair lift Stairs: Yes: Internal: 15 steps; on left going up and External: 2-5 steps; can reach both Has  following equipment at home: Environmental consultant - 2 wheeled, Wheelchair (manual), [employer], Grab bars, and Ramped entry  PLOF: Independent with household mobility without device and Independent with community mobility without device  PATIENT GOALS:   to use prosthesis to be active including gardening, his yard has hill,   OBJECTIVE:  COGNITION: Overall cognitive status: Within functional limits for tasks assessed   SENSATION: WFL  POSTURE:  Eval / 02/17/2023:  rounded shoulders, forward head, flexed trunk , and weight shift right  LOWER EXTREMITY ROM:  ROM P:passive  A:active Right eval Left eval  Hip flexion    Hip extension Standing -15* Standing  -15*  Hip abduction    Hip adduction    Hip internal rotation    Hip external rotation    Knee flexion    Knee extension    Ankle dorsiflexion    Ankle plantarflexion    Ankle inversion    Ankle eversion     (Blank rows = not tested)  LOWER EXTREMITY MMT:  MMT Right eval Left eval  Hip flexion    Hip extension Functional 4/5 Functional 4/5  Hip abduction Functional 4/5 Functional 4/5  Hip adduction    Hip internal rotation    Hip external rotation    Knee flexion Functional 4/5 Functional 4/5  Knee extension Functional 4/5 Functional 4/5  Ankle dorsiflexion 3-/5 NA  Ankle plantarflexion  NA  Ankle inversion  NA  Ankle eversion  NA  (Blank rows = not tested)  TRANSFERS: Eval / 02/17/2023: Sit to stand: SBA requires use of armrests from 18" chair using back of legs against chair & RW to stabilize Stand to sit: SBA requires use of armrests from 18" chair using back of legs against chair & RW to stabilize  GAIT: Eval / 02/17/2023: Gait pattern: step to pattern, decreased step length- Right, decreased stance time- Left, decreased hip/knee flexion- Left, decreased ankle dorsiflexion- Right, Left hip hike, Right foot flat, genu recurvatum- Left, antalgic, trendelenburg, lateral hip instability, trunk flexed, abducted-  Left, and poor foot clearance- Right excessive BUE weight bearing on RW Distance walked: 100' Assistive device utilized: Environmental consultant - 2 wheeled and TTA prosthesis Level of assistance: SBA  FUNCTIONAL TESTs:  Eval / 4/30/2024Sharlene Motts Balance Scale: 11/56    CURRENT PROSTHETIC WEAR ASSESSMENT: Eval /  02/17/2023: Patient is dependent with: skin check, residual limb care, care of non-amputated limb, prosthetic cleaning, ply sock cleaning, correct ply sock adjustment, proper wear schedule/adjustment, and proper weight-bearing schedule/adjustment Donning prosthesis: SBA Doffing prosthesis: SBA Prosthetic  wear tolerance: 4 hours, 1x/day, 4 of 6 days since delivery Prosthetic weight bearing tolerance: 5 minutes with limb pain 8/10 Edema: pitting with 5 sec capillary refill Residual limb condition: cylindrical shape, no open areas, limited to no hair growth, scar dryness, normal color & temperature Prosthetic description: silicon liner with pin lock suspension, total contact socket with flexible inner socket, dynamic response foot K code/activity level with prosthetic use: Level 3    TODAY'S TREATMENT:                                                                                                                             DATE:  03/17/2023: Prosthetic Training with Transtibial Amputation: PT demo & verbal cues on proper step width / not abducting initially using a line on floor for visual reference then progressing to proprioception by 4 steps correct placement 2 abducted.  Patient verbalizes better perception of proper foot placement. Patient ambulated 300 feet x 4 with cane working on scanning while maintaining path and pace, increasing speed, eyes closed to simulate walking in the dark and stepping over obstacles.  Patient able to carry 10 pound kettle bell 50 feet.  All above activities performed with contact-guard to supervision. Patient negotiated ramp and curb with cane and min assist with  verbal cues on technique. Stair negotiation with 2 rails alternating pattern and with single rail/cane descending with step to pattern alternating lead lower extremity.  Patient required supervision and verbal cues.  03/12/2023: Prosthetic Training with Transtibial Amputation: No changes to patient's residual limb.  Patient continues to wear prosthesis all awake hours drying twice during the day. PT reviewed engaging hamstring muscle prior to standing and walking to decrease how much the distal tibia pulled forward into the anterior socket. Worked on sit to and from stand using UEs to push-up but not touching rolling walker for stabilization: engaging hamstrings prior to standing up motion, technique with weight shift over feet prior to erecting trunk, standing up from rolling desk chair placed 2 inches in front of a wall for safety.  Patient improved technique with repetition and PT instruction. Worked on gait with rolling walker: Fluency of motion keeping rolling walker moving, looking forward / not staring at the ground to facilitate an upright posture with less upper extremity weightbearing and engaging hamstrings with initial contact on prosthesis.  Patient verbalized return demonstration understanding how to work on this outside of therapy.  He reports less anterior limb pain. Upon patient request PT educated patient in traveling including to the beach and use of prosthesis with water activities like a pool.  Patient verbalized understanding    03/10/2023: Prosthetic Training with Transtibial Amputation: PT reviewed donning liner without folding skin. PT recommended increasing wear to all awake hours drying limb/liner twice and prn.  Pt verbalized understanding.  Pt amb 75' and 50' X 2 with cane with minA.  Therapeutic Exercise: Leg press BLEs 100# 15 reps 2 sets. Nustep seat 12 level  5 with BLEs/BUEs 7 min. PT cued on set up. Seated chest press machine 10# 10 reps & row 15# 10 reps. PT cued  on use & set-up. Knee ext machine 10# 10 reps  Knee flex machine 20# 10 reps Pt verbalized understanding of above equipment at gym.     HOME EXERCISE PROGRAM:  ASSESSMENT: CLINICAL IMPRESSION: Patient met all short-term goals set for initial 30 days.  PT worked on proper step width and advanced gait activities maintaining balance.  He improved with PT instruction and repetition.  Patient continues to benefit from skilled PT.  OBJECTIVE IMPAIRMENTS: Abnormal gait, decreased activity tolerance, decreased balance, decreased knowledge of condition, decreased knowledge of use of DME, decreased mobility, difficulty walking, decreased strength, increased edema, impaired flexibility, postural dysfunction, prosthetic dependency , and pain.   ACTIVITY LIMITATIONS: carrying, lifting, bending, standing, squatting, stairs, transfers, and locomotion level  PARTICIPATION LIMITATIONS: meal prep, cleaning, driving, community activity, and yard work  PERSONAL FACTORS: Education, Fitness, Past/current experiences, Time since onset of injury/illness/exacerbation, and 3+ comorbidities: see PMH  are also affecting patient's functional outcome.   REHAB POTENTIAL: Good  CLINICAL DECISION MAKING: Evolving/moderate complexity  EVALUATION COMPLEXITY: Moderate   GOALS: Goals reviewed with patient? Yes  SHORT TERM GOALS: Target date: 03/19/2023  Patient donnes prosthesis modified independent & verbalizes proper cleaning. Baseline: SEE OBJECTIVE DATA Goal status: Met 03/17/2023 2.  Patient tolerates prosthesis >12 hrs total /day without skin issues or limb pain <5/10 after standing. Baseline: SEE OBJECTIVE DATA Goal status: Met 03/17/2023  3.  Patient able to stand 30 seconds without UE support with supervision. Baseline: SEE OBJECTIVE DATA Goal status: Met 03/17/2023  4. Patient ambulates 32' with RW & prosthesis with supervision. Baseline: SEE OBJECTIVE DATA Goal status: Met 03/17/2023  5. Patient  negotiates ramps & curbs with RW & prosthesis with supervision. Baseline: SEE OBJECTIVE DATA Goal status: Met 03/17/2023  LONG TERM GOALS: Target date: 05/14/2023  Patient demonstrates & verbalized understanding of prosthetic care to enable safe utilization of prosthesis. Baseline: SEE OBJECTIVE DATA Goal status: Ongoing 02/24/2023  Patient tolerates prosthesis wear >90% of awake hours without skin or limb pain issues. Baseline: SEE OBJECTIVE DATA Goal status: Ongoing 02/24/2023  Berg Balance >/= 30 / 56 to indicate lower fall risk Baseline: SEE OBJECTIVE DATA Goal status: Ongoing 02/24/2023  Patient ambulates >500' with prosthesis & LRAD independently Baseline: SEE OBJECTIVE DATA Goal status: Ongoing 02/24/2023  Patient negotiates ramps, curbs & stairs with single rail with prosthesis & LRAD independently. Baseline: SEE OBJECTIVE DATA Goal status: Ongoing 02/24/2023   PLAN:  PT FREQUENCY: 2x/week  PT DURATION: 12 weeks  PLANNED INTERVENTIONS: Therapeutic exercises, Therapeutic activity, Neuromuscular re-education, Balance training, Gait training, Patient/Family education, Self Care, Stair training, Prosthetic training, DME instructions, Re-evaluation, and physical performance testing  PLAN FOR NEXT SESSION:    gait training with cane including ramps, curbs and stairs.  Continue standing balance activities.    Vladimir Faster, PT, DPT 03/17/2023, 2:33 PM

## 2023-03-24 ENCOUNTER — Encounter: Payer: Self-pay | Admitting: Physical Therapy

## 2023-03-24 ENCOUNTER — Ambulatory Visit (INDEPENDENT_AMBULATORY_CARE_PROVIDER_SITE_OTHER): Payer: Medicare Other | Admitting: Physical Therapy

## 2023-03-24 DIAGNOSIS — R2681 Unsteadiness on feet: Secondary | ICD-10-CM

## 2023-03-24 DIAGNOSIS — R2689 Other abnormalities of gait and mobility: Secondary | ICD-10-CM

## 2023-03-24 DIAGNOSIS — M6281 Muscle weakness (generalized): Secondary | ICD-10-CM | POA: Diagnosis not present

## 2023-03-24 NOTE — Therapy (Signed)
OUTPATIENT PHYSICAL THERAPY PROSTHETICS TREATMENT   Patient Name: [name] MRN: [id number] DOB:06-27-1966, 57 y.o., male Today's Date: 03/24/2023  PCP: [name], MD REFERRING PROVIDER: Marcello Fennel, MD  END OF SESSION:  PT End of Session - 03/24/23 1012     Visit Number 9    Number of Visits 15    Date for PT Re-Evaluation 05/14/23    Authorization Type Medicare & BCBS    Progress Note Due on Visit 10    PT Start Time 1012    PT Stop Time 1058    PT Time Calculation (min) 46 min    Equipment Utilized During Treatment Gait belt    Activity Tolerance Patient tolerated treatment well;Patient limited by pain    Behavior During Therapy [facility] for tasks assessed/performed                Past Medical History:  Diagnosis Date   Acute osteomyelitis of right foot (HCC) 05/15/2019   Anemia 06/23/2019   Aortic stenosis    Bursitis    right shoulder   Complication of anesthesia    Coronary artery disease    Diabetes with renal manifestations(250.4) 08/23/2013   Diabetic infection of right foot (HCC)    Diabetic retinopathy associated with type 2 diabetes mellitus (HCC) 06/23/2019   ESRD on hemodialysis (HCC) 07/18/2011   Essential hypertension 10/18/2007   Qualifier: Diagnosis of  By: Everardo All MD, Gregary Signs A    History of blood transfusion    HYPERLIPIDEMIA 05/16/2008   Qualifier: Diagnosis of  By: Jonny Ruiz MD, Len Blalock    MRSA INFECTION 10/18/2007   Qualifier: Diagnosis of  By: Tyrone Apple, Lucy     Other forms of retinal detachment(361.89) 06/23/2019   PAD (peripheral artery disease) (HCC)    PONV (postoperative nausea and vomiting)    Prolonged QT interval    Pulmonary hypertension (HCC)    Secondary hyperparathyroidism (HCC) 06/23/2019   Sepsis (HCC) 05/14/2019   Past Surgical History:  Procedure Laterality Date   ABDOMINAL AORTOGRAM N/A 05/17/2019   Procedure: ABDOMINAL AORTOGRAM;  Surgeon: Sherren Kerns, MD;  Location: [facility] INVASIVE CV LAB;  Service:  Cardiovascular;  Laterality: N/A;   AMPUTATION TOE Right 05/14/2019   Procedure: PARTIAL AMPUTATION SECOND TOE RIGHT FOOT;  Surgeon: Park Liter, DPM;  Location: [facility] OR;  Service: Podiatry;  Laterality: Right;   AMPUTATION TOE Right 05/23/2019   Procedure: AMPUTATION OF SECOND TOE METATARSAL PHALANGEAL JOINT;  Surgeon: Park Liter, DPM;  Location: [facility] OR;  Service: Podiatry;  Laterality: Right;   AMPUTATION TOE Right 05/27/2019   Procedure: Amputation Toe;  Surgeon: Park Liter, DPM;  Location: [facility] OR;  Service: Podiatry;  Laterality: Right;  right third toe   APPLICATION OF WOUND VAC Right 05/27/2019   Procedure: Application Of Wound Vac;  Surgeon: Park Liter, DPM;  Location: [facility] OR;  Service: Podiatry;  Laterality: Right;   AV FISTULA PLACEMENT  06-18-11   Right brachiocephalic AVF   BONE BIOPSY Right 05/14/2019   Procedure: OPEN SUPERFICIAL BONE BIOPSY GREAT TOE;  Surgeon: Park Liter, DPM;  Location: [facility] OR;  Service: Podiatry;  Laterality: Right;   CARDIAC CATHETERIZATION     CORONARY BALLOON ANGIOPLASTY N/A 09/24/2020   Procedure: CORONARY BALLOON ANGIOPLASTY;  Surgeon: Swaziland, Peter M, MD;  Location: [facility] INVASIVE CV LAB;  Service: Cardiovascular;  Laterality: N/A;   CORONARY PRESSURE/FFR STUDY N/A 09/24/2020   Procedure: INTRAVASCULAR PRESSURE WIRE/FFR STUDY;  Surgeon: Swaziland, Peter M, MD;  Location: [facility];  Service: Cardiovascular;  Laterality: N/A;   CORONARY STENT INTERVENTION N/A 09/24/2020   Procedure: CORONARY STENT INTERVENTION;  Surgeon: Swaziland, Peter M, MD;  Location: [facility] INVASIVE CV LAB;  Service: Cardiovascular;  Laterality: N/A;   EYE SURGERY     left eye for Laser, diabetic retinopathy   FISTULOGRAM Right 11/06/2011   Procedure: FISTULOGRAM;  Surgeon: Fransisco Hertz, MD;  Location: [facility] CATH LAB;  Service: Cardiovascular;  Laterality: Right;   HEMATOMA EVACUATION Right Feb. 25, 2014   HEMATOMA EVACUATION Right 12/14/2012   Procedure: EVACUATION HEMATOMA;   Surgeon: Fransisco Hertz, MD;  Location: [facility] OR;  Service: Vascular;  Laterality: Right;   INCISION AND DRAINAGE Right 05/23/2019   Procedure: INCISION AND DRAINAGE OF RIGHT FOOT DEEP SPACE ABSCESS;  Surgeon: Park Liter, DPM;  Location: [facility] OR;  Service: Podiatry;  Laterality: Right;   INJECTION OF SILICONE OIL Left 01/09/2023   Procedure: INJECTION OF SILICONE OIL;  Surgeon: Edmon Crape, MD;  Location: [facility] OR;  Service: Ophthalmology;  Laterality: Left;   INSERTION OF DIALYSIS CATHETER  12/14/2012   Procedure: INSERTION OF DIALYSIS CATHETER;  Surgeon: Fransisco Hertz, MD;  Location: [facility] OR;  Service: Vascular;;   INSERTION OF DIALYSIS CATHETER Right 01/07/2021   Procedure: INSERTION OF DIALYSIS CATHETER;  Surgeon: Sherren Kerns, MD;  Location: [facility] OR;  Service: Vascular;  Laterality: Right;   INSERTION OF DIALYSIS CATHETER Left 01/08/2021   Procedure: INSERTION OF LEFT INTERNAL JUGULAR DIALYSIS CATHETER, tunneled;  Surgeon: Chuck Hint, MD;  Location: [facility] OR;  Service: Vascular;  Laterality: Left;   IRRIGATION AND DEBRIDEMENT FOOT Right 05/25/2019   Procedure: INCISION AND DRAINAGE FOOT;  Surgeon: Park Liter, DPM;  Location: [facility] OR;  Service: Podiatry;  Laterality: Right;   IRRIGATION AND DEBRIDEMENT FOOT Right 05/27/2019   Procedure: IRRIGATION AND DEBRIDEMENT FOOT;  Surgeon: Park Liter, DPM;  Location: [facility] OR;  Service: Podiatry;  Laterality: Right;   LASER PHOTO ABLATION Left 01/09/2023   Procedure: LASER PHOTO ABLATION;  Surgeon: Edmon Crape, MD;  Location: [name] OR;  Service: Ophthalmology;  Laterality: Left;   LEFT HEART CATH AND CORONARY ANGIOGRAPHY N/A 09/24/2020   Procedure: LEFT HEART CATH AND CORONARY ANGIOGRAPHY;  Surgeon: Swaziland, Peter M, MD;  Location: [facility] INVASIVE CV LAB;  Service: Cardiovascular;  Laterality: N/A;   LOWER EXTREMITY ANGIOGRAPHY Bilateral 05/17/2019   Procedure: LOWER EXTREMITY ANGIOGRAPHY;  Surgeon: Sherren Kerns, MD;  Location: [facility]VAS[facility] CV LAB;   Service: Cardiovascular;  Laterality: Bilateral;   PARS PLANA VITRECTOMY Left 01/09/2023   Procedure: PARS PLANA VITRECTOMY WITH 25 GAUGE AND REPAIR OF RETINAL DETACHMENT;  Surgeon: Edmon Crape, MD;  Location: [facility] OR;  Service: Ophthalmology;  Laterality: Left;   REMOVAL OF A DIALYSIS CATHETER Right 01/08/2021   Procedure: REMOVAL OF TEMPORARY DIALYSIS CATHETER, LEFT FEMORAL VEIN;  Surgeon: Chuck Hint, MD;  Location: [facility] OR;  Service: Vascular;  Laterality: Right;   REVISON OF ARTERIOVENOUS FISTULA Right 12/14/2012   Procedure: REVISON OF upper arm ARTERIOVENOUS FISTULA using 63mmx10cm gortex graft;  Surgeon: Fransisco Hertz, MD;  Location: [facility] OR;  Service: Vascular;  Laterality: Right;   REVISON OF ARTERIOVENOUS FISTULA Right 12/27/2020   Procedure: RIGHT UPPER EXTREMITY ARTERIOVENOUS FISTULA REVISiON;  Surgeon: Leonie Douglas, MD;  Location: [facility] OR;  Service: Vascular;  Laterality: Right;  PERIPHERAL NERVE BLOCK   RIGHT/LEFT HEART CATH AND CORONARY ANGIOGRAPHY N/A 08/07/2020   Procedure: RIGHT/LEFT HEART  CATH AND CORONARY ANGIOGRAPHY;  Surgeon: Swaziland, Peter M, MD;  Location: [facility] INVASIVE CV LAB;  Service: Cardiovascular;  Laterality: N/A;   SOFT TISSUE MASS EXCISION     Right arm, Left leg  for MRSA infection   Patient Active Problem List   Diagnosis Date Noted   ACS (acute coronary syndrome) (HCC) 09/12/2021   Prolonged QT interval 09/12/2021   Unstable angina (HCC) 08/20/2021   Nuclear sclerotic cataract of right eye 06/11/2021   Nuclear sclerotic cataract of left eye 06/11/2021   Posterior subcapsular age-related cataract, left eye 06/11/2021   Stable treated proliferative diabetic retinopathy of left eye determined by examination associated with type 2 diabetes mellitus (HCC) 06/11/2021   Right epiretinal membrane 06/11/2021   Stable treated proliferative diabetic retinopathy of right eye determined by examination associated with type 2 diabetes mellitus (HCC) 06/11/2021    Hyperkalemia 01/09/2021   ESRD (end stage renal disease) (HCC)    Dialysis AV fistula malfunction, initial encounter (HCC) 01/07/2021   NSTEMI (non-ST elevated myocardial infarction) (HCC) 09/21/2020   3-vessel coronary artery disease 08/09/2020   Pulmonary hypertension, unspecified (HCC) 08/09/2020   Pancytopenia (HCC) 08/06/2020   Allergy, unspecified, subsequent encounter 06/28/2020   Anaphylactic shock, unspecified, subsequent encounter 06/28/2020   Lymphedema of both lower extremities 12/29/2019   Hyperphosphatemia 12/11/2019   Encounter for post surgical wound check 08/16/2019   Post-op pain 08/16/2019   S/P transmetatarsal amputation of foot, right (HCC) 08/16/2019   Unspecified protein-calorie malnutrition (HCC) 07/12/2019   PAD (peripheral artery disease) (HCC) 06/30/2019   Diabetic retinopathy associated with type 2 diabetes mellitus (HCC) 06/23/2019   Other forms of retinal detachment(361.89) 06/23/2019   Secondary hyperparathyroidism (HCC) 06/23/2019   Osteomyelitis, unspecified (HCC) 05/31/2019   Gangrene of toe of right foot (HCC)    Ulcer of right foot, with necrosis of bone (HCC)    Abscess of right foot    Diabetic infection of right foot (HCC)    Ulcer of right second toe with necrosis of bone (HCC)    Cellulitis of right foot 05/22/2019   Acute osteomyelitis of right foot (HCC) 05/15/2019   Osteomyelitis of great toe of right foot (HCC) 05/14/2019   Diabetic foot ulcer- right second toe 05/14/2019   Sepsis (HCC) 05/14/2019   Hypothyroidism, unspecified 01/11/2018   Hypercalcemia 06/14/2016   Dyslipidemia 05/11/2015   Hyperglycemia 05/05/2015   Diarrhea, unspecified 02/01/2015   Fever, unspecified 02/01/2015   Pain, unspecified 02/01/2015   Pruritus, unspecified 02/01/2015   Coagulation defect, unspecified (HCC) 07/07/2014   Encounter for removal of sutures 05/30/2014   Encounter for immunization 04/19/2014   Chest pain 08/23/2013   Type 2 diabetes mellitus  (HCC) 08/23/2013   Hemodialysis status (HCC) 08/23/2013   Chronic kidney disease, stage V (HCC) 08/23/2013   Obesity, unspecified 08/23/2013   Complication of vascular dialysis catheter 12/15/2012   Other fluid overload 12/07/2012   Other specified complication of vascular prosthetic devices, implants and grafts, subsequent encounter 08/06/2012   ESRD on dialysis (HCC) 07/18/2011   Pure hypercholesterolemia 06/23/2011   Iron deficiency anemia, unspecified 06/21/2011   HYPOGLYCEMIA, UNSPECIFIED 11/13/2008   RENAL INSUFFICIENCY 07/31/2008   FOOT ULCER, LEFT 06/29/2008   HYPERLIPIDEMIA 05/16/2008   MRSA INFECTION 10/18/2007   Essential hypertension 10/18/2007    ONSET DATE: 02/12/2023 prosthesis delivery  REFERRING DIAG: 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  He has allergies in eyes which is causing some blurriness.  He is wearing all awake hours except 1 hour midday.  He has some issues with pain & length of prosthesis.   PERTINENT HISTORY: left BKA, right TMA, DM2, PAD, CAD, MI, ESRD on HD, moderate AS, HTN, hypothyroidism, anemia, CHF  PAIN:  Are you having pain?   Yes: NPRS scale: this morning 0/10 Pain location: low back, muscles of both LEs Pain description: dull ache, sciatica pain Aggravating factors: walking with lean,  Relieving factors: exercises  Residual limb pain 6-7/10 mainly at distal tibia  PRECAUTIONS: Fall and Other: No BP RUE  WEIGHT BEARING RESTRICTIONS: No  FALLS: Has patient fallen in last 6 months? No  LIVING ENVIRONMENT: Lives with: lives with their spouse Lives in: House Home Access: Ramped  entrance Home layout: Two level, 1/2 bath on main level, and stair lift Stairs: Yes: Internal: 15 steps; on left going up and External: 2-5 steps; can reach both Has following equipment at home: Environmental consultant - 2 wheeled, Wheelchair (manual), [employer], Grab bars, and Ramped entry  PLOF: Independent with household mobility without device and Independent with community mobility without device  PATIENT GOALS:   to use prosthesis to be active including gardening, his yard has hill,   OBJECTIVE:  COGNITION: Overall cognitive status: Within functional limits for tasks assessed   SENSATION: WFL  POSTURE:  Eval / 02/17/2023:  rounded shoulders, forward head, flexed trunk , and weight shift right  LOWER EXTREMITY ROM:  ROM P:passive  A:active Right eval Left eval  Hip flexion    Hip extension Standing -15* Standing  -15*  Hip abduction    Hip adduction    Hip internal rotation    Hip external rotation    Knee flexion    Knee extension    Ankle dorsiflexion    Ankle plantarflexion    Ankle inversion    Ankle eversion     (Blank rows = not tested)  LOWER EXTREMITY MMT:  MMT Right eval Left eval  Hip flexion    Hip extension Functional 4/5 Functional 4/5  Hip abduction Functional 4/5 Functional 4/5  Hip adduction    Hip internal rotation    Hip external rotation    Knee flexion Functional 4/5 Functional 4/5  Knee extension Functional 4/5 Functional 4/5  Ankle dorsiflexion 3-/5 NA  Ankle plantarflexion  NA  Ankle inversion  NA  Ankle eversion  NA  (Blank rows = not tested)  TRANSFERS: Eval / 02/17/2023: Sit to stand: SBA requires use of armrests from 18" chair using back of legs against chair & RW to stabilize Stand to sit: SBA requires use of armrests from 18" chair using back of legs against chair & RW to stabilize  GAIT: Eval / 02/17/2023: Gait pattern: step to pattern, decreased step length- Right, decreased stance time- Left, decreased hip/knee flexion-  Left, decreased ankle dorsiflexion- Right, Left hip hike, Right foot flat, genu recurvatum- Left, antalgic, trendelenburg, lateral hip instability, trunk flexed, abducted- Left, and poor foot clearance- Right excessive BUE weight bearing on RW Distance walked: 100' Assistive device utilized: Environmental consultant - 2 wheeled and TTA prosthesis Level of assistance: SBA  FUNCTIONAL TESTs:  Eval / 4/30/2024Sharlene Motts Balance Scale: 11/56    CURRENT PROSTHETIC WEAR ASSESSMENT: Eval /  02/17/2023: Patient is dependent with: skin check,  residual limb care, care of non-amputated limb, prosthetic cleaning, ply sock cleaning, correct ply sock adjustment, proper wear schedule/adjustment, and proper weight-bearing schedule/adjustment Donning prosthesis: SBA Doffing prosthesis: SBA Prosthetic wear tolerance: 4 hours, 1x/day, 4 of 6 days since delivery Prosthetic weight bearing tolerance: 5 minutes with limb pain 8/10 Edema: pitting with 5 sec capillary refill Residual limb condition: cylindrical shape, no open areas, limited to no hair growth, scar dryness, normal color & temperature Prosthetic description: silicon liner with pin lock suspension, total contact socket with flexible inner socket, dynamic response foot K code/activity level with prosthetic use: Level 3    TODAY'S TREATMENT:                                                                                                                             DATE:  03/24/2023: Prosthetic Training with Transtibial Amputation: No changes to patient's residual limb.  Patient continue to wear prosthesis all awake hours drying twice during the day. PT reviewed distal pain management 1st engaging hamstring muscle, 2nd check socks & 3rd check location of liner umbrella contact point. Pt verbalized understanding.  Verbal cues on sit to stand.  Pt amb 150' X 3 with cane with supervision with cues on RLE rotation & upright posture.  Pt neg ramp & curb with cane with close  supervision & cues.  Pt neg flight of 11 steps with PT demo & verbal cues on technique: descend step to pattern alternating lead leg first 5 steps 2 rails, then 4 steps left rail / RUE cane & last 2 steps right rail / LUE cane.  Ascend alternating pattern first 7 steps 2 rails, then 2 steps left rail / RUE cane & last 2 steps right rail / LUE cane PT demo & verbal cues on picking up item from floor. Pt performed 3 reps with supervision. PT instructed in safe set-up chair behind & in front with table to left side. Pt verbalized understanding.     03/12/2023: Prosthetic Training with Transtibial Amputation: No changes to patient's residual limb.  Patient continues to wear prosthesis all awake hours drying twice during the day. PT reviewed engaging hamstring muscle prior to standing and walking to decrease how much the distal tibia pulled forward into the anterior socket. Worked on sit to and from stand using UEs to push-up but not touching rolling walker for stabilization: engaging hamstrings prior to standing up motion, technique with weight shift over feet prior to erecting trunk, standing up from rolling desk chair placed 2 inches in front of a wall for safety.  Patient improved technique with repetition and PT instruction. Worked on gait with rolling walker: Fluency of motion keeping rolling walker moving, looking forward / not staring at the ground to facilitate an upright posture with less upper extremity weightbearing and engaging hamstrings with initial contact on prosthesis.  Patient verbalized return demonstration understanding how to work on this outside of therapy.  He reports less anterior limb  pain. Upon patient request PT educated patient in traveling including to the beach and use of prosthesis with water activities like a pool.  Patient verbalized understanding    03/10/2023: Prosthetic Training with Transtibial Amputation: PT reviewed donning liner without folding skin. PT recommended  increasing wear to all awake hours drying limb/liner twice and prn.  Pt verbalized understanding.  Pt amb 75' and 50' X 2 with cane with minA.  Therapeutic Exercise: Leg press BLEs 100# 15 reps 2 sets. Nustep seat 12 level 5 with BLEs/BUEs 7 min. PT cued on set up. Seated chest press machine 10# 10 reps & row 15# 10 reps. PT cued on use & set-up. Knee ext machine 10# 10 reps  Knee flex machine 20# 10 reps Pt verbalized understanding of above equipment at gym.     HOME EXERCISE PROGRAM:  ASSESSMENT: CLINICAL IMPRESSION: Patient met all STGs set for first 30 days.  He improved his ability to negotiate ramps, curbs & stairs.  He continues to improve wear, use & function with prosthesis.  Patient continues to benefit from skilled PT.  OBJECTIVE IMPAIRMENTS: Abnormal gait, decreased activity tolerance, decreased balance, decreased knowledge of condition, decreased knowledge of use of DME, decreased mobility, difficulty walking, decreased strength, increased edema, impaired flexibility, postural dysfunction, prosthetic dependency , and pain.   ACTIVITY LIMITATIONS: carrying, lifting, bending, standing, squatting, stairs, transfers, and locomotion level  PARTICIPATION LIMITATIONS: meal prep, cleaning, driving, community activity, and yard work  PERSONAL FACTORS: Education, Fitness, Past/current experiences, Time since onset of injury/illness/exacerbation, and 3+ comorbidities: see PMH  are also affecting patient's functional outcome.   REHAB POTENTIAL: Good  CLINICAL DECISION MAKING: Evolving/moderate complexity  EVALUATION COMPLEXITY: Moderate   GOALS: Goals reviewed with patient? Yes  SHORT TERM GOALS: Target date: 03/19/2023  Patient donnes prosthesis modified independent & verbalizes proper cleaning. Baseline: SEE OBJECTIVE DATA Goal status:  MET 03/24/2023 2.  Patient tolerates prosthesis >12 hrs total /day without skin issues or limb pain <5/10 after standing. Baseline: SEE  OBJECTIVE DATA Goal status: MET 03/24/2023  3.  Patient able to stand 30 seconds without UE support with supervision. Baseline: SEE OBJECTIVE DATA Goal status: MET 03/24/2023  4. Patient ambulates 54' with RW & prosthesis with supervision. Baseline: SEE OBJECTIVE DATA Goal status: MET 03/24/2023  5. Patient negotiates ramps & curbs with RW & prosthesis with supervision. Baseline: SEE OBJECTIVE DATA Goal status: MET 03/24/2023  LONG TERM GOALS: Target date: 05/14/2023  Patient demonstrates & verbalized understanding of prosthetic care to enable safe utilization of prosthesis. Baseline: SEE OBJECTIVE DATA Goal status: Ongoing 02/24/2023  Patient tolerates prosthesis wear >90% of awake hours without skin or limb pain issues. Baseline: SEE OBJECTIVE DATA Goal status: Ongoing 02/24/2023  Berg Balance >/= 30 / 56 to indicate lower fall risk Baseline: SEE OBJECTIVE DATA Goal status: Ongoing 02/24/2023  Patient ambulates >500' with prosthesis & LRAD independently Baseline: SEE OBJECTIVE DATA Goal status: Ongoing 02/24/2023  Patient negotiates ramps, curbs & stairs with single rail with prosthesis & LRAD independently. Baseline: SEE OBJECTIVE DATA Goal status: Ongoing 02/24/2023   PLAN:  PT FREQUENCY: 2x/week  PT DURATION: 12 weeks  PLANNED INTERVENTIONS: Therapeutic exercises, Therapeutic activity, Neuromuscular re-education, Balance training, Gait training, Patient/Family education, Self Care, Stair training, Prosthetic training, DME instructions, Re-evaluation, and physical performance testing  PLAN FOR NEXT SESSION:  do 10th visit note, gait training with cane including ramps, curbs &stairs.  Continue standing balance activities.    Vladimir Faster, PT, DPT 03/24/2023, 12:45 PM

## 2023-03-26 ENCOUNTER — Encounter: Payer: Self-pay | Admitting: Physical Therapy

## 2023-03-26 ENCOUNTER — Ambulatory Visit (INDEPENDENT_AMBULATORY_CARE_PROVIDER_SITE_OTHER): Payer: Medicare Other | Admitting: Physical Therapy

## 2023-03-26 DIAGNOSIS — R2689 Other abnormalities of gait and mobility: Secondary | ICD-10-CM

## 2023-03-26 DIAGNOSIS — M79662 Pain in left lower leg: Secondary | ICD-10-CM | POA: Diagnosis not present

## 2023-03-26 DIAGNOSIS — R2681 Unsteadiness on feet: Secondary | ICD-10-CM | POA: Diagnosis not present

## 2023-03-26 DIAGNOSIS — M6281 Muscle weakness (generalized): Secondary | ICD-10-CM

## 2023-03-26 NOTE — Therapy (Signed)
OUTPATIENT PHYSICAL THERAPY PROSTHETICS TREATMENT & PROGRESS NOTE   Patient Name: [name] MRN: [id number] DOB:March 28, 1966, 57 y.o., male Today's Date: 03/26/2023  PCP: [name], MD REFERRING PROVIDER: Marcello Fennel, MD  Progress Note Reporting Period 02/17/2023 to 03/26/2023  See note below for Objective Data and Assessment of Progress/Goals.      END OF SESSION:  PT End of Session - 03/26/23 1049     Visit Number 10    Number of Visits 15    Date for PT Re-Evaluation 05/14/23    Authorization Type Medicare & BCBS    Progress Note Due on Visit 20    PT Start Time 1055    PT Stop Time 1138    PT Time Calculation (min) 43 min    Equipment Utilized During Treatment Gait belt    Activity Tolerance Patient tolerated treatment well;Patient limited by pain    Behavior During Therapy [facility] for tasks assessed/performed                 Past Medical History:  Diagnosis Date   Acute osteomyelitis of right foot (HCC) 05/15/2019   Anemia 06/23/2019   Aortic stenosis    Bursitis    right shoulder   Complication of anesthesia    Coronary artery disease    Diabetes with renal manifestations(250.4) 08/23/2013   Diabetic infection of right foot (HCC)    Diabetic retinopathy associated with type 2 diabetes mellitus (HCC) 06/23/2019   ESRD on hemodialysis (HCC) 07/18/2011   Essential hypertension 10/18/2007   Qualifier: Diagnosis of  By: Everardo All MD, Gregary Signs A    History of blood transfusion    HYPERLIPIDEMIA 05/16/2008   Qualifier: Diagnosis of  By: Jonny Ruiz MD, Len Blalock    MRSA INFECTION 10/18/2007   Qualifier: Diagnosis of  By: Tyrone Apple, Lucy     Other forms of retinal detachment(361.89) 06/23/2019   PAD (peripheral artery disease) (HCC)    PONV (postoperative nausea and vomiting)    Prolonged QT interval    Pulmonary hypertension (HCC)    Secondary hyperparathyroidism (HCC) 06/23/2019   Sepsis (HCC) 05/14/2019   Past Surgical History:  Procedure Laterality Date    ABDOMINAL AORTOGRAM N/A 05/17/2019   Procedure: ABDOMINAL AORTOGRAM;  Surgeon: Sherren Kerns, MD;  Location: [facility] INVASIVE CV LAB;  Service: Cardiovascular;  Laterality: N/A;   AMPUTATION TOE Right 05/14/2019   Procedure: PARTIAL AMPUTATION SECOND TOE RIGHT FOOT;  Surgeon: Park Liter, DPM;  Location: [facility] OR;  Service: Podiatry;  Laterality: Right;   AMPUTATION TOE Right 05/23/2019   Procedure: AMPUTATION OF SECOND TOE METATARSAL PHALANGEAL JOINT;  Surgeon: Park Liter, DPM;  Location: [facility] OR;  Service: Podiatry;  Laterality: Right;   AMPUTATION TOE Right 05/27/2019   Procedure: Amputation Toe;  Surgeon: Park Liter, DPM;  Location: [facility] OR;  Service: Podiatry;  Laterality: Right;  right third toe   APPLICATION OF WOUND VAC Right 05/27/2019   Procedure: Application Of Wound Vac;  Surgeon: Park Liter, DPM;  Location: [facility] OR;  Service: Podiatry;  Laterality: Right;   AV FISTULA PLACEMENT  06-18-11   Right brachiocephalic AVF   BONE BIOPSY Right 05/14/2019   Procedure: OPEN SUPERFICIAL BONE BIOPSY GREAT TOE;  Surgeon: Park Liter, DPM;  Location: [facility] OR;  Service: Podiatry;  Laterality: Right;   CARDIAC CATHETERIZATION     CORONARY BALLOON ANGIOPLASTY N/A 09/24/2020   Procedure: CORONARY BALLOON ANGIOPLASTY;  Surgeon: Swaziland, Peter M, MD;  Location: [facility] INVASIVE CV  LAB;  Service: Cardiovascular;  Laterality: N/A;   CORONARY PRESSURE/FFR STUDY N/A 09/24/2020   Procedure: INTRAVASCULAR PRESSURE WIRE/FFR STUDY;  Surgeon: Swaziland, Peter M, MD;  Location: [facility] INVASIVE CV LAB;  Service: Cardiovascular;  Laterality: N/A;   CORONARY STENT INTERVENTION N/A 09/24/2020   Procedure: CORONARY STENT INTERVENTION;  Surgeon: Swaziland, Peter M, MD;  Location: [facility] INVASIVE CV LAB;  Service: Cardiovascular;  Laterality: N/A;   EYE SURGERY     left eye for Laser, diabetic retinopathy   FISTULOGRAM Right 11/06/2011   Procedure: FISTULOGRAM;  Surgeon: Fransisco Hertz, MD;  Location: [facility] CATH LAB;  Service:  Cardiovascular;  Laterality: Right;   HEMATOMA EVACUATION Right Feb. 25, 2014   HEMATOMA EVACUATION Right 12/14/2012   Procedure: EVACUATION HEMATOMA;  Surgeon: Fransisco Hertz, MD;  Location: [facility] OR;  Service: Vascular;  Laterality: Right;   INCISION AND DRAINAGE Right 05/23/2019   Procedure: INCISION AND DRAINAGE OF RIGHT FOOT DEEP SPACE ABSCESS;  Surgeon: Park Liter, DPM;  Location: [facility] OR;  Service: Podiatry;  Laterality: Right;   INJECTION OF SILICONE OIL Left 01/09/2023   Procedure: INJECTION OF SILICONE OIL;  Surgeon: Edmon Crape, MD;  Location: [facility] OR;  Service: Ophthalmology;  Laterality: Left;   INSERTION OF DIALYSIS CATHETER  12/14/2012   Procedure: INSERTION OF DIALYSIS CATHETER;  Surgeon: Fransisco Hertz, MD;  Location: [facility] OR;  Service: Vascular;;   INSERTION OF DIALYSIS CATHETER Right 01/07/2021   Procedure: INSERTION OF DIALYSIS CATHETER;  Surgeon: Sherren Kerns, MD;  Location: [facility] OR;  Service: Vascular;  Laterality: Right;   INSERTION OF DIALYSIS CATHETER Left 01/08/2021   Procedure: INSERTION OF LEFT INTERNAL JUGULAR DIALYSIS CATHETER, tunneled;  Surgeon: Chuck Hint, MD;  Location: [facility] OR;  Service: Vascular;  Laterality: Left;   IRRIGATION AND DEBRIDEMENT FOOT Right 05/25/2019   Procedure: INCISION AND DRAINAGE FOOT;  Surgeon: Park Liter, DPM;  Location: [facility] OR;  Service: Podiatry;  Laterality: Right;   IRRIGATION AND DEBRIDEMENT FOOT Right 05/27/2019   Procedure: IRRIGATION AND DEBRIDEMENT FOOT;  Surgeon: Park Liter, DPM;  Location: [facility] OR;  Service: Podiatry;  Laterality: Right;   LASER PHOTO ABLATION Left 01/09/2023   Procedure: LASER PHOTO ABLATION;  Surgeon: Edmon Crape, MD;  Location: [facility] OR;  Service: Ophthalmology;  Laterality: Left;   LEFT HEART CATH AND CORONARY ANGIOGRAPHY N/A 09/24/2020   Procedure: LEFT HEART CATH AND CORONARY ANGIOGRAPHY;  Surgeon: Swaziland, Peter M, MD;  Location: [facility] INVASIVE CV LAB;  Service: Cardiovascular;  Laterality: N/A;    LOWER EXTREMITY ANGIOGRAPHY Bilateral 05/17/2019   Procedure: LOWER EXTREMITY ANGIOGRAPHY;  Surgeon: Sherren Kerns, MD;  Location: [facility] INVASIVE CV LAB;  Service: Cardiovascular;  Laterality: Bilateral;   PARS PLANA VITRECTOMY Left 01/09/2023   Procedure: PARS PLANA VITRECTOMY WITH 25 GAUGE AND REPAIR OF RETINAL DETACHMENT;  Surgeon: Edmon Crape, MD;  Location: [facility] OR;  Service: Ophthalmology;  Laterality: Left;   REMOVAL OF A DIALYSIS CATHETER Right 01/08/2021   Procedure: REMOVAL OF TEMPORARY DIALYSIS CATHETER, LEFT FEMORAL VEIN;  Surgeon: Chuck Hint, MD;  Location: [facility] OR;  Service: Vascular;  Laterality: Right;   REVISON OF ARTERIOVENOUS FISTULA Right 12/14/2012   Procedure: REVISON OF upper arm ARTERIOVENOUS FISTULA using 32mmx10cm gortex graft;  Surgeon: Fransisco Hertz, MD;  Location: [facility] OR;  Service: Vascular;  Laterality: Right;   REVISON OF ARTERIOVENOUS FISTULA Right 12/27/2020   Procedure: RIGHT UPPER EXTREMITY ARTERIOVENOUS FISTULA REVISiON;  Surgeon: Leonie Douglas, MD;  Location: [facility] OR;  Service: Vascular;  Laterality: Right;  PERIPHERAL NERVE BLOCK   RIGHT/LEFT HEART CATH AND CORONARY ANGIOGRAPHY N/A 08/07/2020   Procedure: RIGHT/LEFT HEART CATH AND CORONARY ANGIOGRAPHY;  Surgeon: Swaziland, Peter M, MD;  Location: [facility] INVASIVE CV LAB;  Service: Cardiovascular;  Laterality: N/A;   SOFT TISSUE MASS EXCISION     Right arm, Left leg  for MRSA infection   Patient Active Problem List   Diagnosis Date Noted   ACS (acute coronary syndrome) (HCC) 09/12/2021   Prolonged QT interval 09/12/2021   Unstable angina (HCC) 08/20/2021   Nuclear sclerotic cataract of right eye 06/11/2021   Nuclear sclerotic cataract of left eye 06/11/2021   Posterior subcapsular age-related cataract, left eye 06/11/2021   Stable treated proliferative diabetic retinopathy of left eye determined by examination associated with type 2 diabetes mellitus (HCC) 06/11/2021   Right epiretinal membrane 06/11/2021    Stable treated proliferative diabetic retinopathy of right eye determined by examination associated with type 2 diabetes mellitus (HCC) 06/11/2021   Hyperkalemia 01/09/2021   ESRD (end stage renal disease) (HCC)    Dialysis AV fistula malfunction, initial encounter (HCC) 01/07/2021   NSTEMI (non-ST elevated myocardial infarction) (HCC) 09/21/2020   3-vessel coronary artery disease 08/09/2020   Pulmonary hypertension, unspecified (HCC) 08/09/2020   Pancytopenia (HCC) 08/06/2020   Allergy, unspecified, subsequent encounter 06/28/2020   Anaphylactic shock, unspecified, subsequent encounter 06/28/2020   Lymphedema of both lower extremities 12/29/2019   Hyperphosphatemia 12/11/2019   Encounter for post surgical wound check 08/16/2019   Post-op pain 08/16/2019   S/P transmetatarsal amputation of foot, right (HCC) 08/16/2019   Unspecified protein-calorie malnutrition (HCC) 07/12/2019   PAD (peripheral artery disease) (HCC) 06/30/2019   Diabetic retinopathy associated with type 2 diabetes mellitus (HCC) 06/23/2019   Other forms of retinal detachment(361.89) 06/23/2019   Secondary hyperparathyroidism (HCC) 06/23/2019   Osteomyelitis, unspecified (HCC) 05/31/2019   Gangrene of toe of right foot (HCC)    Ulcer of right foot, with necrosis of bone (HCC)    Abscess of right foot    Diabetic infection of right foot (HCC)    Ulcer of right second toe with necrosis of bone (HCC)    Cellulitis of right foot 05/22/2019   Acute osteomyelitis of right foot (HCC) 05/15/2019   Osteomyelitis of great toe of right foot (HCC) 05/14/2019   Diabetic foot ulcer- right second toe 05/14/2019   Sepsis (HCC) 05/14/2019   Hypothyroidism, unspecified 01/11/2018   Hypercalcemia 06/14/2016   Dyslipidemia 05/11/2015   Hyperglycemia 05/05/2015   Diarrhea, unspecified 02/01/2015   Fever, unspecified 02/01/2015   Pain, unspecified 02/01/2015   Pruritus, unspecified 02/01/2015   Coagulation defect, unspecified (HCC)  07/07/2014   Encounter for removal of sutures 05/30/2014   Encounter for immunization 04/19/2014   Chest pain 08/23/2013   Type 2 diabetes mellitus (HCC) 08/23/2013   Hemodialysis status (HCC) 08/23/2013   Chronic kidney disease, stage V (HCC) 08/23/2013   Obesity, unspecified 08/23/2013   Complication of vascular dialysis catheter 12/15/2012   Other fluid overload 12/07/2012   Other specified complication of vascular prosthetic devices, implants and grafts, subsequent encounter 08/06/2012   ESRD on dialysis (HCC) 07/18/2011   Pure hypercholesterolemia 06/23/2011   Iron deficiency anemia, unspecified 06/21/2011   HYPOGLYCEMIA, UNSPECIFIED 11/13/2008   RENAL INSUFFICIENCY 07/31/2008   FOOT ULCER, LEFT 06/29/2008   HYPERLIPIDEMIA 05/16/2008   MRSA INFECTION 10/18/2007   Essential hypertension 10/18/2007    ONSET DATE: 02/12/2023 prosthesis delivery  REFERRING DIAG: 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 No pads added.     PERTINENT HISTORY: left BKA, right TMA, DM2, PAD, CAD, MI, ESRD on HD, moderate AS, HTN, hypothyroidism, anemia, CHF  PAIN:  Are you having pain?   Yes: NPRS scale: this morning 0/10 Pain location: low back, muscles of both LEs Pain description: dull ache, sciatica pain Aggravating factors: walking with lean,  Relieving factors: exercises  Residual limb pain 6-7/10 mainly at distal tibia  PRECAUTIONS: Fall and Other: No BP RUE  WEIGHT BEARING RESTRICTIONS: No  FALLS: Has patient fallen in last 6 months? No  LIVING ENVIRONMENT: Lives with: lives with their spouse Lives  in: House Home Access: Ramped entrance Home layout: Two level, 1/2 bath on main level, and stair lift Stairs: Yes: Internal: 15 steps; on left going up and External: 2-5 steps; can reach both Has following equipment at home: Environmental consultant - 2 wheeled, Wheelchair (manual), [employer], Grab bars, and Ramped entry  PLOF: Independent with household mobility without device and Independent with community mobility without device  PATIENT GOALS:   to use prosthesis to be active including gardening, his yard has hill,   OBJECTIVE:  COGNITION: Overall cognitive status: Within functional limits for tasks assessed   SENSATION: WFL  POSTURE:  Eval / 02/17/2023:  rounded shoulders, forward head, flexed trunk , and weight shift right  LOWER EXTREMITY ROM:  ROM P:passive  A:active Right eval Left eval  Hip flexion    Hip extension Standing -15* Standing  -15*  Hip abduction    Hip adduction    Hip internal rotation    Hip external rotation    Knee flexion    Knee extension    Ankle dorsiflexion    Ankle plantarflexion    Ankle inversion    Ankle eversion     (Blank rows = not tested)  LOWER EXTREMITY MMT:  MMT Right eval Left eval  Hip flexion    Hip extension Functional 4/5 Functional 4/5  Hip abduction Functional 4/5 Functional 4/5  Hip adduction    Hip internal rotation    Hip external rotation    Knee flexion Functional 4/5 Functional 4/5  Knee extension Functional 4/5 Functional 4/5  Ankle dorsiflexion 3-/5 NA  Ankle plantarflexion  NA  Ankle inversion  NA  Ankle eversion  NA  (Blank rows = not tested)  TRANSFERS: Eval / 02/17/2023: Sit to stand: SBA requires use of armrests from 18" chair using back of legs against chair & RW to stabilize Stand to sit: SBA requires use of armrests from 18" chair using back of legs against chair & RW to stabilize  GAIT: 03/26/2023: with cane Gait Velocity 2.13 ft/sec comfortable self-selected pace with SBA & 2.60 ft/sec fast  pace with CGA Functional Gait Assessment with cane:  [facility] PT Assessment - 03/26/23 1100       Functional Gait  Assessment   Gait Level Surface Walks 20 ft, slow speed, abnormal gait pattern, evidence for imbalance or deviates 10-15 in outside of the 12 in walkway width. Requires more than 7 sec to ambulate 20 ft.    Change in Gait Speed Cannot change speeds, deviates greater than 15 in  outside 12 in walkway width, or loses balance and has to reach for wall or be caught.    Gait with Horizontal Head Turns Performs head turns with moderate changes in gait velocity, slows down, deviates 10-15 in outside 12 in walkway width but recovers, can continue to walk.    Gait with Vertical Head Turns Performs task with moderate change in gait velocity, slows down, deviates 10-15 in outside 12 in walkway width but recovers, can continue to walk.    Gait and Pivot Turn Turns slowly, requires verbal cueing, or requires several small steps to catch balance following turn and stop    Step Over Obstacle Cannot perform without assistance.    Gait with Narrow Base of Support Ambulates less than 4 steps heel to toe or cannot perform without assistance.    Gait with Eyes Closed Cannot walk 20 ft without assistance, severe gait deviations or imbalance, deviates greater than 15 in outside 12 in walkway width or will not attempt task.    Ambulating Backwards Cannot walk 20 ft without assistance, severe gait deviations or imbalance, deviates greater than 15 in outside 12 in walkway width or will not attempt task.    Steps Two feet to a stair, must use rail.    Total Score 5    FGA comment: with cane & prosthesis              Eval / 02/17/2023: Gait pattern: step to pattern, decreased step length- Right, decreased stance time- Left, decreased hip/knee flexion- Left, decreased ankle dorsiflexion- Right, Left hip hike, Right foot flat, genu recurvatum- Left, antalgic, trendelenburg, lateral hip instability, trunk  flexed, abducted- Left, and poor foot clearance- Right excessive BUE weight bearing on RW Distance walked: 100' Assistive device utilized: Environmental consultant - 2 wheeled and TTA prosthesis Level of assistance: SBA  FUNCTIONAL TESTs:  Eval / 4/30/2024Sharlene Motts Balance Scale: 11/56   CURRENT PROSTHETIC WEAR ASSESSMENT: 03/25/2023: pt reports wearing prosthesis all awake hours including to dialysis with some distal tibia pain.  He verbalizes how to adjust ply socks, properly donnes and reports proper cleaning.   Eval /  02/17/2023: Patient is dependent with: skin check, residual limb care, care of non-amputated limb, prosthetic cleaning, ply sock cleaning, correct ply sock adjustment, proper wear schedule/adjustment, and proper weight-bearing schedule/adjustment Donning prosthesis: SBA Doffing prosthesis: SBA Prosthetic wear tolerance: 4 hours, 1x/day, 4 of 6 days since delivery Prosthetic weight bearing tolerance: 5 minutes with limb pain 8/10 Edema: pitting with 5 sec capillary refill Residual limb condition: cylindrical shape, no open areas, limited to no hair growth, scar dryness, normal color & temperature Prosthetic description: silicon liner with pin lock suspension, total contact socket with flexible inner socket, dynamic response foot K code/activity level with prosthetic use: Level 3    TODAY'S TREATMENT:  DATE:  03/26/2023: Prosthetic Training with Transtibial Amputation: See objective data for gait & prosthetic wear. PT reviewed results with pt who reports understanding.  Pt amb 300' with cane with SBA / CGA.   PT demo & verbal cues on stepping over obstacle. Pt performed inside //bars initially with BUE //bars, then RUE bar then with cane.  Pt neg curb with cane with minA.  Neuromuscular Re-education: Standing crossways on foam beam working on hip & ankle/residual limb  strategies:  head turns eyes open feet hip width with minA & intermittent touch //bars.  Step strategy anticipatory stepping off beam & stabilizing with BLEs anteriorly, posteriorly & laterally. Pt requires minA or intermittent touch //bars. PT demo & verbal cues on balance in corner with chair back in front for safety. Pt verbalized understanding.    03/24/2023: Prosthetic Training with Transtibial Amputation: No changes to patient's residual limb.  Patient continue to wear prosthesis all awake hours drying twice during the day. PT reviewed distal pain management 1st engaging hamstring muscle, 2nd check socks & 3rd check location of liner umbrella contact point. Pt verbalized understanding.  Verbal cues on sit to stand.  Pt amb 150' X 3 with cane with supervision with cues on RLE rotation & upright posture.  Pt neg ramp & curb with cane with close supervision & cues.  Pt neg flight of 11 steps with PT demo & verbal cues on technique: descend step to pattern alternating lead leg first 5 steps 2 rails, then 4 steps left rail / RUE cane & last 2 steps right rail / LUE cane.  Ascend alternating pattern first 7 steps 2 rails, then 2 steps left rail / RUE cane & last 2 steps right rail / LUE cane PT demo & verbal cues on picking up item from floor. Pt performed 3 reps with supervision. PT instructed in safe set-up chair behind & in front with table to left side. Pt verbalized understanding.     03/12/2023: Prosthetic Training with Transtibial Amputation: No changes to patient's residual limb.  Patient continues to wear prosthesis all awake hours drying twice during the day. PT reviewed engaging hamstring muscle prior to standing and walking to decrease how much the distal tibia pulled forward into the anterior socket. Worked on sit to and from stand using UEs to push-up but not touching rolling walker for stabilization: engaging hamstrings prior to standing up motion, technique with weight shift over  feet prior to erecting trunk, standing up from rolling desk chair placed 2 inches in front of a wall for safety.  Patient improved technique with repetition and PT instruction. Worked on gait with rolling walker: Fluency of motion keeping rolling walker moving, looking forward / not staring at the ground to facilitate an upright posture with less upper extremity weightbearing and engaging hamstrings with initial contact on prosthesis.  Patient verbalized return demonstration understanding how to work on this outside of therapy.  He reports less anterior limb pain. Upon patient request PT educated patient in traveling including to the beach and use of prosthesis with water activities like a pool.  Patient verbalized understanding   HOME EXERCISE PROGRAM:  ASSESSMENT: CLINICAL IMPRESSION: Victoria has made significant progress with his safety & mobility with his prosthesis.  Patient met all STGs set for first 30 days.  He improved his ability to negotiate ramps, curbs & stairs.  He continues to improve wear, use & function with prosthesis.  Patient continues to benefit from skilled PT.  OBJECTIVE IMPAIRMENTS: Abnormal  gait, decreased activity tolerance, decreased balance, decreased knowledge of condition, decreased knowledge of use of DME, decreased mobility, difficulty walking, decreased strength, increased edema, impaired flexibility, postural dysfunction, prosthetic dependency , and pain.   ACTIVITY LIMITATIONS: carrying, lifting, bending, standing, squatting, stairs, transfers, and locomotion level  PARTICIPATION LIMITATIONS: meal prep, cleaning, driving, community activity, and yard work  PERSONAL FACTORS: Education, Fitness, Past/current experiences, Time since onset of injury/illness/exacerbation, and 3+ comorbidities: see PMH  are also affecting patient's functional outcome.   REHAB POTENTIAL: Good  CLINICAL DECISION MAKING: Evolving/moderate complexity  EVALUATION COMPLEXITY:  Moderate   GOALS: Goals reviewed with patient? Yes  SHORT TERM GOALS: Target date: 03/19/2023  Patient donnes prosthesis modified independent & verbalizes proper cleaning. Baseline: SEE OBJECTIVE DATA Goal status:  MET 03/24/2023 2.  Patient tolerates prosthesis >12 hrs total /day without skin issues or limb pain <5/10 after standing. Baseline: SEE OBJECTIVE DATA Goal status: MET 03/24/2023  3.  Patient able to stand 30 seconds without UE support with supervision. Baseline: SEE OBJECTIVE DATA Goal status: MET 03/24/2023  4. Patient ambulates 69' with RW & prosthesis with supervision. Baseline: SEE OBJECTIVE DATA Goal status: MET 03/24/2023  5. Patient negotiates ramps & curbs with RW & prosthesis with supervision. Baseline: SEE OBJECTIVE DATA Goal status: MET 03/24/2023  LONG TERM GOALS: Target date: 05/14/2023  Patient demonstrates & verbalized understanding of prosthetic care to enable safe utilization of prosthesis. Baseline: SEE OBJECTIVE DATA Goal status: Ongoing 02/24/2023  Patient tolerates prosthesis wear >90% of awake hours without skin or limb pain issues. Baseline: SEE OBJECTIVE DATA Goal status: Ongoing 02/24/2023  Berg Balance >/= 30 / 56 to indicate lower fall risk Baseline: SEE OBJECTIVE DATA Goal status: Ongoing 02/24/2023  Patient ambulates >500' with prosthesis & LRAD independently Baseline: SEE OBJECTIVE DATA Goal status: Ongoing 02/24/2023  Patient negotiates ramps, curbs & stairs with single rail with prosthesis & LRAD independently. Baseline: SEE OBJECTIVE DATA Goal status: Ongoing 02/24/2023   PLAN:  PT FREQUENCY: 2x/week  PT DURATION: 12 weeks  PLANNED INTERVENTIONS: Therapeutic exercises, Therapeutic activity, Neuromuscular re-education, Balance training, Gait training, Patient/Family education, Self Care, Stair training, Prosthetic training, DME instructions, Re-evaluation, and physical performance testing  PLAN FOR NEXT SESSION:   gait training with  cane including ramps, curbs &stairs.  Continue standing balance activities facilitating balance strategies.    Vladimir Faster, PT, DPT 03/26/2023, 12:55 PM

## 2023-03-31 ENCOUNTER — Encounter: Payer: Medicare Other | Admitting: Physical Therapy

## 2023-04-02 ENCOUNTER — Encounter: Payer: Medicare Other | Admitting: Physical Therapy

## 2023-04-07 ENCOUNTER — Encounter: Payer: Medicare Other | Admitting: Physical Therapy

## 2023-04-09 ENCOUNTER — Ambulatory Visit (INDEPENDENT_AMBULATORY_CARE_PROVIDER_SITE_OTHER): Payer: Medicare Other | Admitting: Physical Therapy

## 2023-04-09 ENCOUNTER — Encounter: Payer: Self-pay | Admitting: Physical Therapy

## 2023-04-09 DIAGNOSIS — R2689 Other abnormalities of gait and mobility: Secondary | ICD-10-CM

## 2023-04-09 DIAGNOSIS — M79662 Pain in left lower leg: Secondary | ICD-10-CM | POA: Diagnosis not present

## 2023-04-09 DIAGNOSIS — R2681 Unsteadiness on feet: Secondary | ICD-10-CM | POA: Diagnosis not present

## 2023-04-09 DIAGNOSIS — M6281 Muscle weakness (generalized): Secondary | ICD-10-CM

## 2023-04-09 NOTE — Therapy (Signed)
OUTPATIENT PHYSICAL THERAPY PROSTHETICS TREATMENT   Patient Name: [name] MRN: [id number] DOB:09-03-1966, 57 y.o., male Today's Date: 04/09/2023  PCP: [name], MD REFERRING PROVIDER: Marcello Fennel, MD  END OF SESSION:  PT End of Session - 04/09/23 1016     Visit Number 11    Number of Visits 15    Date for PT Re-Evaluation 05/14/23    Authorization Type Medicare & BCBS    Progress Note Due on Visit 20    PT Start Time 1015    PT Stop Time 1100    PT Time Calculation (min) 45 min    Equipment Utilized During Treatment Gait belt    Activity Tolerance Patient tolerated treatment well;Patient limited by pain    Behavior During Therapy [facility] for tasks assessed/performed                  Past Medical History:  Diagnosis Date   Acute osteomyelitis of right foot (HCC) 05/15/2019   Anemia 06/23/2019   Aortic stenosis    Bursitis    right shoulder   Complication of anesthesia    Coronary artery disease    Diabetes with renal manifestations(250.4) 08/23/2013   Diabetic infection of right foot (HCC)    Diabetic retinopathy associated with type 2 diabetes mellitus (HCC) 06/23/2019   ESRD on hemodialysis (HCC) 07/18/2011   Essential hypertension 10/18/2007   Qualifier: Diagnosis of  By: Everardo All MD, Gregary Signs A    History of blood transfusion    HYPERLIPIDEMIA 05/16/2008   Qualifier: Diagnosis of  By: Jonny Ruiz MD, Len Blalock    MRSA INFECTION 10/18/2007   Qualifier: Diagnosis of  By: Tyrone Apple, Lucy     Other forms of retinal detachment(361.89) 06/23/2019   PAD (peripheral artery disease) (HCC)    PONV (postoperative nausea and vomiting)    Prolonged QT interval    Pulmonary hypertension (HCC)    Secondary hyperparathyroidism (HCC) 06/23/2019   Sepsis (HCC) 05/14/2019   Past Surgical History:  Procedure Laterality Date   ABDOMINAL AORTOGRAM N/A 05/17/2019   Procedure: ABDOMINAL AORTOGRAM;  Surgeon: Sherren Kerns, MD;  Location: [facility] INVASIVE CV LAB;  Service:  Cardiovascular;  Laterality: N/A;   AMPUTATION TOE Right 05/14/2019   Procedure: PARTIAL AMPUTATION SECOND TOE RIGHT FOOT;  Surgeon: Park Liter, DPM;  Location: [facility] OR;  Service: Podiatry;  Laterality: Right;   AMPUTATION TOE Right 05/23/2019   Procedure: AMPUTATION OF SECOND TOE METATARSAL PHALANGEAL JOINT;  Surgeon: Park Liter, DPM;  Location: [facility] OR;  Service: Podiatry;  Laterality: Right;   AMPUTATION TOE Right 05/27/2019   Procedure: Amputation Toe;  Surgeon: Park Liter, DPM;  Location: [facility] OR;  Service: Podiatry;  Laterality: Right;  right third toe   APPLICATION OF WOUND VAC Right 05/27/2019   Procedure: Application Of Wound Vac;  Surgeon: Park Liter, DPM;  Location: [facility] OR;  Service: Podiatry;  Laterality: Right;   AV FISTULA PLACEMENT  06-18-11   Right brachiocephalic AVF   BONE BIOPSY Right 05/14/2019   Procedure: OPEN SUPERFICIAL BONE BIOPSY GREAT TOE;  Surgeon: Park Liter, DPM;  Location: [facility] OR;  Service: Podiatry;  Laterality: Right;   CARDIAC CATHETERIZATION     CORONARY BALLOON ANGIOPLASTY N/A 09/24/2020   Procedure: CORONARY BALLOON ANGIOPLASTY;  Surgeon: Swaziland, Peter M, MD;  Location: [facility] INVASIVE CV LAB;  Service: Cardiovascular;  Laterality: N/A;   CORONARY PRESSURE/FFR STUDY N/A 09/24/2020   Procedure: INTRAVASCULAR PRESSURE WIRE/FFR STUDY;  Surgeon: Swaziland, Peter  M, MD;  Location: [facility] IN[facility] CV LAB;  Service: Cardiovascular;  Laterality: N/A;   CORONARY STENT INTERVENTION N/A 09/24/2020   Procedure: CORONARY STENT INTERVENTION;  Surgeon: Swaziland, Peter M, MD;  Location: [facility] INVASIVE CV LAB;  Service: Cardiovascular;  Laterality: N/A;   EYE SURGERY     left eye for Laser, diabetic retinopathy   FISTULOGRAM Right 11/06/2011   Procedure: FISTULOGRAM;  Surgeon: Fransisco Hertz, MD;  Location: [facility] CATH LAB;  Service: Cardiovascular;  Laterality: Right;   HEMATOMA EVACUATION Right Feb. 25, 2014   HEMATOMA EVACUATION Right 12/14/2012   Procedure: EVACUATION HEMATOMA;   Surgeon: Fransisco Hertz, MD;  Location: [facility] OR;  Service: Vascular;  Laterality: Right;   INCISION AND DRAINAGE Right 05/23/2019   Procedure: INCISION AND DRAINAGE OF RIGHT FOOT DEEP SPACE ABSCESS;  Surgeon: Park Liter, DPM;  Location: [facility] OR;  Service: Podiatry;  Laterality: Right;   INJECTION OF SILICONE OIL Left 01/09/2023   Procedure: INJECTION OF SILICONE OIL;  Surgeon: Edmon Crape, MD;  Location: [facility] OR;  Service: Ophthalmology;  Laterality: Left;   INSERTION OF DIALYSIS CATHETER  12/14/2012   Procedure: INSERTION OF DIALYSIS CATHETER;  Surgeon: Fransisco Hertz, MD;  Location: [facility] OR;  Service: Vascular;;   INSERTION OF DIALYSIS CATHETER Right 01/07/2021   Procedure: INSERTION OF DIALYSIS CATHETER;  Surgeon: Sherren Kerns, MD;  Location: [facility] OR;  Service: Vascular;  Laterality: Right;   INSERTION OF DIALYSIS CATHETER Left 01/08/2021   Procedure: INSERTION OF LEFT INTERNAL JUGULAR DIALYSIS CATHETER, tunneled;  Surgeon: Chuck Hint, MD;  Location: [name] OR;  Service: Vascular;  Laterality: Left;   IRRIGATION AND DEBRIDEMENT FOOT Right 05/25/2019   Procedure: INCISION AND DRAINAGE FOOT;  Surgeon: Park Liter, DPM;  [facility]: [facility] OR;  Service: Podiatry;  Laterality: Right;   IRRIGATION AND DEBRIDEMENT FOOT Right 05/27/2019   Procedure: IRRIGATION AND DEBRIDEMENT FOOT;  Surgeon: Park Liter, DPM;  Location: [facility] OR;  Service: Podiatry;  Laterality: Right;   LASER PHOTO ABLATION Left 01/09/2023   Procedure: LASER PHOTO ABLATION;  Surgeon: Edmon Crape, MD;  Location: [facility] OR;  Service: Ophthalmology;  Laterality: Left;   LEFT HEART CATH AND CORONARY ANGIOGRAPHY N/A 09/24/2020   Procedure: LEFT HEART CATH AND CORONARY ANGIOGRAPHY;  Surgeon: Swaziland, Peter M, MD;  Location: [facility] INVASIVE CV LAB;  Service: Cardiovascular;  Laterality: N/A;   LOWER EXTREMITY ANGIOGRAPHY Bilateral 05/17/2019   Procedure: LOWER EXTREMITY ANGIOGRAPHY;  Surgeon: Sherren Kerns, MD;  Location: [facility] INVASIVE CV LAB;   Service: Cardiovascular;  Laterality: Bilateral;   PARS PLANA VITRECTOMY Left 01/09/2023   Procedure: PARS PLANA VITRECTOMY WITH 25 GAUGE AND REPAIR OF RETINAL DETACHMENT;  Surgeon: Edmon Crape, MD;  Location: [facility] OR;  Service: Ophthalmology;  Laterality: Left;   REMOVAL OF A DIALYSIS CATHETER Right 01/08/2021   Procedure: REMOVAL OF TEMPORARY DIALYSIS CATHETER, LEFT FEMORAL VEIN;  Surgeon: Chuck Hint, MD;  Location: [facility] OR;  Service: Vascular;  Laterality: Right;   REVISON OF ARTERIOVENOUS FISTULA Right 12/14/2012   Procedure: REVISON OF upper arm ARTERIOVENOUS FISTULA using 37mmx10cm gortex graft;  Surgeon: Fransisco Hertz, MD;  Location: [facility] OR;  Service: Vascular;  Laterality: Right;   REVISON OF ARTERIOVENOUS FISTULA Right 12/27/2020   Procedure: RIGHT UPPER EXTREMITY ARTERIOVENOUS FISTULA REVISiON;  Surgeon: Leonie Douglas, MD;  Location: [facility] OR;  Service: Vascular;  Laterality: Right;  PERIPHERAL NERVE BLOCK   RIGHT/LEFT HEART CATH AND CORONARY ANGIOGRAPHY N/A 08/07/2020  Procedure: RIGHT/LEFT HEART CATH AND CORONARY ANGIOGRAPHY;  Surgeon: Swaziland, Peter M, MD;  Location: [facility] INVASIVE CV LAB;  Service: Cardiovascular;  Laterality: N/A;   SOFT TISSUE MASS EXCISION     Right arm, Left leg  for MRSA infection   Patient Active Problem List   Diagnosis Date Noted   ACS (acute coronary syndrome) (HCC) 09/12/2021   Prolonged QT interval 09/12/2021   Unstable angina (HCC) 08/20/2021   Nuclear sclerotic cataract of right eye 06/11/2021   Nuclear sclerotic cataract of left eye 06/11/2021   Posterior subcapsular age-related cataract, left eye 06/11/2021   Stable treated proliferative diabetic retinopathy of left eye determined by examination associated with type 2 diabetes mellitus (HCC) 06/11/2021   Right epiretinal membrane 06/11/2021   Stable treated proliferative diabetic retinopathy of right eye determined by examination associated with type 2 diabetes mellitus (HCC) 06/11/2021    Hyperkalemia 01/09/2021   ESRD (end stage renal disease) (HCC)    Dialysis AV fistula malfunction, initial encounter (HCC) 01/07/2021   NSTEMI (non-ST elevated myocardial infarction) (HCC) 09/21/2020   3-vessel coronary artery disease 08/09/2020   Pulmonary hypertension, unspecified (HCC) 08/09/2020   Pancytopenia (HCC) 08/06/2020   Allergy, unspecified, subsequent encounter 06/28/2020   Anaphylactic shock, unspecified, subsequent encounter 06/28/2020   Lymphedema of both lower extremities 12/29/2019   Hyperphosphatemia 12/11/2019   Encounter for post surgical wound check 08/16/2019   Post-op pain 08/16/2019   S/P transmetatarsal amputation of foot, right (HCC) 08/16/2019   Unspecified protein-calorie malnutrition (HCC) 07/12/2019   PAD (peripheral artery disease) (HCC) 06/30/2019   Diabetic retinopathy associated with type 2 diabetes mellitus (HCC) 06/23/2019   Other forms of retinal detachment(361.89) 06/23/2019   Secondary hyperparathyroidism (HCC) 06/23/2019   Osteomyelitis, unspecified (HCC) 05/31/2019   Gangrene of toe of right foot (HCC)    Ulcer of right foot, with necrosis of bone (HCC)    Abscess of right foot    Diabetic infection of right foot (HCC)    Ulcer of right second toe with necrosis of bone (HCC)    Cellulitis of right foot 05/22/2019   Acute osteomyelitis of right foot (HCC) 05/15/2019   Osteomyelitis of great toe of right foot (HCC) 05/14/2019   Diabetic foot ulcer- right second toe 05/14/2019   Sepsis (HCC) 05/14/2019   Hypothyroidism, unspecified 01/11/2018   Hypercalcemia 06/14/2016   Dyslipidemia 05/11/2015   Hyperglycemia 05/05/2015   Diarrhea, unspecified 02/01/2015   Fever, unspecified 02/01/2015   Pain, unspecified 02/01/2015   Pruritus, unspecified 02/01/2015   Coagulation defect, unspecified (HCC) 07/07/2014   Encounter for removal of sutures 05/30/2014   Encounter for immunization 04/19/2014   Chest pain 08/23/2013   Type 2 diabetes mellitus  (HCC) 08/23/2013   Hemodialysis status (HCC) 08/23/2013   Chronic kidney disease, stage V (HCC) 08/23/2013   Obesity, unspecified 08/23/2013   Complication of vascular dialysis catheter 12/15/2012   Other fluid overload 12/07/2012   Other specified complication of vascular prosthetic devices, implants and grafts, subsequent encounter 08/06/2012   ESRD on dialysis (HCC) 07/18/2011   Pure hypercholesterolemia 06/23/2011   Iron deficiency anemia, unspecified 06/21/2011   HYPOGLYCEMIA, UNSPECIFIED 11/13/2008   RENAL INSUFFICIENCY 07/31/2008   FOOT ULCER, LEFT 06/29/2008   HYPERLIPIDEMIA 05/16/2008   MRSA INFECTION 10/18/2007   Essential hypertension 10/18/2007    ONSET DATE: 02/12/2023 prosthesis delivery  REFERRING DIAG: 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 No signs of infection.  He plans to get new shoes this weekend.    PERTINENT HISTORY: left BKA, right TMA, DM2, PAD, CAD, MI, ESRD on HD, moderate AS, HTN, hypothyroidism, anemia, CHF  PAIN:  Are you having pain?   Yes: NPRS scale: this morning 0/10 Pain location: low back, muscles of both LEs Pain description: dull ache, sciatica pain Aggravating factors: walking with lean,  Relieving factors: exercises  Residual limb pain 6-7/10 mainly at distal tibia  PRECAUTIONS: Fall and Other: No BP RUE  WEIGHT BEARING RESTRICTIONS: No  FALLS: Has patient fallen in last 6 months? No  LIVING ENVIRONMENT: Lives with: lives with their spouse Lives in: House Home Access: Ramped entrance Home  layout: Two level, 1/2 bath on main level, and stair lift Stairs: Yes: Internal: 15 steps; on left going up and External: 2-5 steps; can reach both Has following equipment at home: Environmental consultant - 2 wheeled, Wheelchair (manual), [employer], Grab bars, and Ramped entry  PLOF: Independent with household mobility without device and Independent with community mobility without device  PATIENT GOALS:   to use prosthesis to be active including gardening, his yard has hill,   OBJECTIVE:  COGNITION: Overall cognitive status: Within functional limits for tasks assessed   SENSATION: WFL  POSTURE:  Eval / 02/17/2023:  rounded shoulders, forward head, flexed trunk , and weight shift right  LOWER EXTREMITY ROM:  ROM P:passive  A:active Right eval Left eval  Hip flexion    Hip extension Standing -15* Standing  -15*  Hip abduction    Hip adduction    Hip internal rotation    Hip external rotation    Knee flexion    Knee extension    Ankle dorsiflexion    Ankle plantarflexion    Ankle inversion    Ankle eversion     (Blank rows = not tested)  LOWER EXTREMITY MMT:  MMT Right eval Left eval  Hip flexion    Hip extension Functional 4/5 Functional 4/5  Hip abduction Functional 4/5 Functional 4/5  Hip adduction    Hip internal rotation    Hip external rotation    Knee flexion Functional 4/5 Functional 4/5  Knee extension Functional 4/5 Functional 4/5  Ankle dorsiflexion 3-/5 NA  Ankle plantarflexion  NA  Ankle inversion  NA  Ankle eversion  NA  (Blank rows = not tested)  TRANSFERS: Eval / 02/17/2023: Sit to stand: SBA requires use of armrests from 18" chair using back of legs against chair & RW to stabilize Stand to sit: SBA requires use of armrests from 18" chair using back of legs against chair & RW to stabilize  GAIT: 03/26/2023: with cane Gait Velocity 2.13 ft/sec comfortable self-selected pace with SBA & 2.60 ft/sec fast pace with CGA Functional Gait Assessment with  cane:    Eval / 02/17/2023: Gait pattern: step to pattern, decreased step length- Right, decreased stance time- Left, decreased hip/knee flexion- Left, decreased ankle dorsiflexion- Right, Left hip hike, Right foot flat, genu recurvatum- Left, antalgic, trendelenburg, lateral hip instability, trunk flexed, abducted- Left, and poor foot clearance- Right excessive BUE weight bearing on RW Distance walked: 100' Assistive device utilized: Environmental consultant - 2 wheeled and TTA prosthesis Level of assistance: SBA  FUNCTIONAL TESTs:  Eval / 02/17/2023:   Sharlene Motts Balance Scale: 11/56   CURRENT PROSTHETIC WEAR ASSESSMENT: 03/25/2023: pt reports wearing prosthesis all awake hours including to dialysis with some distal tibia pain.  He verbalizes how to adjust ply socks, properly donnes and reports proper cleaning.   Eval /  02/17/2023: Patient is dependent with: skin check, residual limb care, care of non-amputated limb, prosthetic cleaning, ply sock cleaning, correct ply sock adjustment, proper wear schedule/adjustment, and proper weight-bearing schedule/adjustment Donning prosthesis: SBA Doffing prosthesis: SBA Prosthetic wear tolerance: 4 hours, 1x/day, 4 of 6 days since delivery Prosthetic weight bearing tolerance: 5 minutes with limb pain 8/10 Edema: pitting with 5 sec capillary refill Residual limb condition: cylindrical shape, no open areas, limited to no hair growth, scar dryness, normal color & temperature Prosthetic description: silicon liner with pin lock suspension, total contact socket with flexible inner socket, dynamic response foot K code/activity level with prosthetic use: Level 3    TODAY'S TREATMENT:                                                                                                                             DATE:  04/09/2023: Prosthetic Training with Transtibial Amputation: He wore prosthesis all awake hours even when not feeling well.   Pt amb 300' with cane working on scanning  & eyes closed to simulate low vision situations.   Therapeutic exercise: PT demo, verbal & HO cues on HEP for gastroc, soleus & eversion stretches.  Stretch to facilitate upright posture at door frame reaching single UE & BUE overhead and standing with posterior pelvis to counter.  Pt verbalized & return demo understanding of HEP see below.  03/26/2023: Prosthetic Training with Transtibial Amputation: See objective data for gait & prosthetic wear. PT reviewed results with pt who reports understanding.  Pt amb 300' with cane with SBA / CGA.   PT demo & verbal cues on stepping over obstacle. Pt performed inside //bars initially with BUE //bars, then RUE bar then with cane.  Pt neg curb with cane with minA.  Neuromuscular Re-education: Standing crossways on foam beam working on hip & ankle/residual limb strategies:  head turns eyes open feet hip width with minA & intermittent touch //bars.  Step strategy anticipatory stepping off beam & stabilizing with BLEs anteriorly, posteriorly & laterally. Pt requires minA or intermittent touch //bars. PT demo & verbal cues on balance in corner with chair back in front for safety. Pt verbalized understanding.    03/24/2023: Prosthetic Training with Transtibial Amputation: No changes to patient's residual limb.  Patient continue to wear prosthesis all awake hours drying twice during the day. PT reviewed distal pain management 1st engaging hamstring muscle, 2nd check socks & 3rd check location of liner umbrella contact point. Pt verbalized understanding.  Verbal cues on sit to stand.  Pt amb 150' X 3 with cane with supervision with cues on RLE rotation & upright posture.  Pt neg ramp &  curb with cane with close supervision & cues.  Pt neg flight of 11 steps with PT demo & verbal cues on technique: descend step to pattern alternating lead leg first 5 steps 2 rails, then 4 steps left rail / RUE cane & last 2 steps right rail / LUE cane.  Ascend alternating  pattern first 7 steps 2 rails, then 2 steps left rail / RUE cane & last 2 steps right rail / LUE cane PT demo & verbal cues on picking up item from floor. Pt performed 3 reps with supervision. PT instructed in safe set-up chair behind & in front with table to left side. Pt verbalized understanding.     HOME EXERCISE PROGRAM: Access Code: [email or web address] URL: [email or web address] Date: 04/09/2023 Prepared by: Vladimir Faster  Exercises - standing calf stretch with forefoot on small step or brick  - 2 x daily - 7 x weekly - 1 sets - 3 reps - 30 seconds hold - Standing Soleus Stretch on Foam 1/2 Roll  - 2 x daily - 7 x weekly - 1 sets - 13 reps - 30 seconds hold - Upright Stance at Door Frame Single Arm  - 1-3 x daily - 7 x weekly - 1 sets - 2 reps - 2 deep breathes hold - Upright Stance at Door Frame with Both Arms  - 1-3 x daily - 7 x weekly - 1 sets - 2 reps - 2 deep breathes hold - Standing posture with back to counter  - 1 x daily - 5 x weekly - 1 sets - 10 reps - 5 seconds hold - Walk with Head Turns  - 1 x daily - 7 x weekly - 1 sets - 10 reps - Walking with Eyes Closed  - 1 x daily - 7 x weekly - 1 sets - 10 reps  ASSESSMENT: CLINICAL IMPRESSION: Patient reports that he can see improvements in function thanks to PT.  He appears to understand HEP instructed today including rationale.  Patient continues to benefit from skilled PT.  OBJECTIVE IMPAIRMENTS: Abnormal gait, decreased activity tolerance, decreased balance, decreased knowledge of condition, decreased knowledge of use of DME, decreased mobility, difficulty walking, decreased strength, increased edema, impaired flexibility, postural dysfunction, prosthetic dependency , and pain.   ACTIVITY LIMITATIONS: carrying, lifting, bending, standing, squatting, stairs, transfers, and locomotion level  PARTICIPATION LIMITATIONS: meal prep, cleaning, driving, community activity, and yard work  PERSONAL FACTORS: Education, Fitness,  Past/current experiences, Time since onset of injury/illness/exacerbation, and 3+ comorbidities: see PMH  are also affecting patient's functional outcome.   REHAB POTENTIAL: Good  CLINICAL DECISION MAKING: Evolving/moderate complexity  EVALUATION COMPLEXITY: Moderate   GOALS: Goals reviewed with patient? Yes  SHORT TERM GOALS: Target date: 03/19/2023  Patient donnes prosthesis modified independent & verbalizes proper cleaning. Baseline: SEE OBJECTIVE DATA Goal status:  MET 03/24/2023 2.  Patient tolerates prosthesis >12 hrs total /day without skin issues or limb pain <5/10 after standing. Baseline: SEE OBJECTIVE DATA Goal status: MET 03/24/2023  3.  Patient able to stand 30 seconds without UE support with supervision. Baseline: SEE OBJECTIVE DATA Goal status: MET 03/24/2023  4. Patient ambulates 27' with RW & prosthesis with supervision. Baseline: SEE OBJECTIVE DATA Goal status: MET 03/24/2023  5. Patient negotiates ramps & curbs with RW & prosthesis with supervision. Baseline: SEE OBJECTIVE DATA Goal status: MET 03/24/2023  LONG TERM GOALS: Target date: 05/14/2023  Patient demonstrates & verbalized understanding of prosthetic care to enable safe utilization of prosthesis. Baseline: SEE OBJECTIVE DATA Goal  status: Ongoing 02/24/2023  Patient tolerates prosthesis wear >90% of awake hours without skin or limb pain issues. Baseline: SEE OBJECTIVE DATA Goal status: Ongoing 02/24/2023  Berg Balance >/= 30 / 56 to indicate lower fall risk Baseline: SEE OBJECTIVE DATA Goal status: Ongoing 02/24/2023  Patient ambulates >500' with prosthesis & LRAD independently Baseline: SEE OBJECTIVE DATA Goal status: Ongoing 02/24/2023  Patient negotiates ramps, curbs & stairs with single rail with prosthesis & LRAD independently. Baseline: SEE OBJECTIVE DATA Goal status: Ongoing 02/24/2023   PLAN:  PT FREQUENCY: 2x/week  PT DURATION: 12 weeks  PLANNED INTERVENTIONS: Therapeutic exercises,  Therapeutic activity, Neuromuscular re-education, Balance training, Gait training, Patient/Family education, Self Care, Stair training, Prosthetic training, DME instructions, Re-evaluation, and physical performance testing  PLAN FOR NEXT SESSION:  check how HEP going,  gait training with cane including ramps, curbs &stairs.  Continue standing balance activities facilitating balance strategies.    Vladimir Faster, PT, DPT 04/09/2023, 12:53 PM

## 2023-04-14 ENCOUNTER — Ambulatory Visit (INDEPENDENT_AMBULATORY_CARE_PROVIDER_SITE_OTHER): Payer: Medicare Other | Admitting: Physical Therapy

## 2023-04-14 ENCOUNTER — Encounter: Payer: Self-pay | Admitting: Physical Therapy

## 2023-04-14 DIAGNOSIS — M79662 Pain in left lower leg: Secondary | ICD-10-CM

## 2023-04-14 DIAGNOSIS — M6281 Muscle weakness (generalized): Secondary | ICD-10-CM

## 2023-04-14 DIAGNOSIS — R2681 Unsteadiness on feet: Secondary | ICD-10-CM

## 2023-04-14 DIAGNOSIS — R2689 Other abnormalities of gait and mobility: Secondary | ICD-10-CM | POA: Diagnosis not present

## 2023-04-14 NOTE — Therapy (Signed)
OUTPATIENT PHYSICAL THERAPY PROSTHETICS TREATMENT   Patient Name: [name] MRN: [id number] DOB:03-11-1966, 57 y.o., male Today's Date: 04/14/2023  PCP: [name], MD REFERRING PROVIDER: Marcello Fennel, MD  END OF SESSION:  PT End of Session - 04/14/23 1015     Visit Number 12    Number of Visits 15    Date for PT Re-Evaluation 05/14/23    Authorization Type Medicare & BCBS    Progress Note Due on Visit 20    PT Start Time 1014    PT Stop Time 1056    PT Time Calculation (min) 42 min    Equipment Utilized During Treatment Gait belt    Activity Tolerance Patient tolerated treatment well;Patient limited by pain    Behavior During Therapy [facility] for tasks assessed/performed                  Past Medical History:  Diagnosis Date   Acute osteomyelitis of right foot (HCC) 05/15/2019   Anemia 06/23/2019   Aortic stenosis    Bursitis    right shoulder   Complication of anesthesia    Coronary artery disease    Diabetes with renal manifestations(250.4) 08/23/2013   Diabetic infection of right foot (HCC)    Diabetic retinopathy associated with type 2 diabetes mellitus (HCC) 06/23/2019   ESRD on hemodialysis (HCC) 07/18/2011   Essential hypertension 10/18/2007   Qualifier: Diagnosis of  By: Everardo All MD, Gregary Signs A    History of blood transfusion    HYPERLIPIDEMIA 05/16/2008   Qualifier: Diagnosis of  By: Jonny Ruiz MD, Len Blalock    MRSA INFECTION 10/18/2007   Qualifier: Diagnosis of  By: Tyrone Apple, Lucy     Other forms of retinal detachment(361.89) 06/23/2019   PAD (peripheral artery disease) (HCC)    PONV (postoperative nausea and vomiting)    Prolonged QT interval    Pulmonary hypertension (HCC)    Secondary hyperparathyroidism (HCC) 06/23/2019   Sepsis (HCC) 05/14/2019   Past Surgical History:  Procedure Laterality Date   ABDOMINAL AORTOGRAM N/A 05/17/2019   Procedure: ABDOMINAL AORTOGRAM;  Surgeon: Sherren Kerns, MD;  Location: [facility] INVASIVE CV LAB;  Service:  Cardiovascular;  Laterality: N/A;   AMPUTATION TOE Right 05/14/2019   Procedure: PARTIAL AMPUTATION SECOND TOE RIGHT FOOT;  Surgeon: Park Liter, DPM;  Location: [facility] OR;  Service: Podiatry;  Laterality: Right;   AMPUTATION TOE Right 05/23/2019   Procedure: AMPUTATION OF SECOND TOE METATARSAL PHALANGEAL JOINT;  Surgeon: Park Liter, DPM;  Location: [facility] OR;  Service: Podiatry;  Laterality: Right;   AMPUTATION TOE Right 05/27/2019   Procedure: Amputation Toe;  Surgeon: Park Liter, DPM;  Location: [facility] OR;  Service: Podiatry;  Laterality: Right;  right third toe   APPLICATION OF WOUND VAC Right 05/27/2019   Procedure: Application Of Wound Vac;  Surgeon: Park Liter, DPM;  Location: [facility] OR;  Service: Podiatry;  Laterality: Right;   AV FISTULA PLACEMENT  06-18-11   Right brachiocephalic AVF   BONE BIOPSY Right 05/14/2019   Procedure: OPEN SUPERFICIAL BONE BIOPSY GREAT TOE;  Surgeon: Park Liter, DPM;  Location: [facility] OR;  Service: Podiatry;  Laterality: Right;   CARDIAC CATHETERIZATION     CORONARY BALLOON ANGIOPLASTY N/A 09/24/2020   Procedure: CORONARY BALLOON ANGIOPLASTY;  Surgeon: Swaziland, Peter M, MD;  Location: [facility] INVASIVE CV LAB;  Service: Cardiovascular;  Laterality: N/A;   CORONARY PRESSURE/FFR STUDY N/A 09/24/2020   Procedure: INTRAVASCULAR PRESSURE WIRE/FFR STUDY;  Surgeon: Swaziland, Peter  M, MD;  Location: [facility] INVAS[facility] CV LAB;  Service: Cardiovascular;  Laterality: N/A;   CORONARY STENT INTERVENTION N/A 09/24/2020   Procedure: CORONARY STENT INTERVENTION;  Surgeon: Swaziland, Peter M, MD;  Location: [facility] INVASIVE CV LAB;  Service: Cardiovascular;  Laterality: N/A;   EYE SURGERY     left eye for Laser, diabetic retinopathy   FISTULOGRAM Right 11/06/2011   Procedure: FISTULOGRAM;  Surgeon: Fransisco Hertz, MD;  Location: [facility] CATH LAB;  Service: Cardiovascular;  Laterality: Right;   HEMATOMA EVACUATION Right Feb. 25, 2014   HEMATOMA EVACUATION Right 12/14/2012   Procedure: EVACUATION HEMATOMA;   Surgeon: Fransisco Hertz, MD;  Location: [facility] OR;  Service: Vascular;  Laterality: Right;   INCISION AND DRAINAGE Right 05/23/2019   Procedure: INCISION AND DRAINAGE OF RIGHT FOOT DEEP SPACE ABSCESS;  Surgeon: Park Liter, DPM;  Location: [facility] OR;  Service: Podiatry;  Laterality: Right;   INJECTION OF SILICONE OIL Left 01/09/2023   Procedure: INJECTION OF SILICONE OIL;  Surgeon: Edmon Crape, MD;  Location: [facility] OR;  Service: Ophthalmology;  Laterality: Left;   INSERTION OF DIALYSIS CATHETER  12/14/2012   Procedure: INSERTION OF DIALYSIS CATHETER;  Surgeon: Fransisco Hertz, MD;  Location: [facility] OR;  Service: Vascular;;   INSERTION OF DIALYSIS CATHETER Right 01/07/2021   Procedure: INSERTION OF DIALYSIS CATHETER;  Surgeon: Sherren Kerns, MD;  Location: [facility] OR;  Service: Vascular;  Laterality: Right;   INSERTION OF DIALYSIS CATHETER Left 01/08/2021   Procedure: INSERTION OF LEFT INTERNAL JUGULAR DIALYSIS CATHETER, tunneled;  Surgeon: Chuck Hint, MD;  Location: [name] OR;  Service: Vascular;  Laterality: Left;   IRRIGATION AND DEBRIDEMENT FOOT Right 05/25/2019   Procedure: INCISION AND DRAINAGE FOOT;  Surgeon: Park Liter, DPM;  Location: [facility] OR;  Service: Podiatry;  Laterality: Right;   IRRIGATION AND DEBRIDEMENT FOOT Right 05/27/2019   Procedure: IRRIGATION AND DEBRIDEMENT FOOT;  Surgeon: Park Liter, DPM;  Location: [facility] OR;  Service: Podiatry;  Laterality: Right;   LASER PHOTO ABLATION Left 01/09/2023   Procedure: LASER PHOTO ABLATION;  Surgeon: Edmon Crape, MD;  Location: [facility] OR;  Service: Ophthalmology;  Laterality: Left;   LEFT HEART CATH AND CORONARY ANGIOGRAPHY N/A 09/24/2020   Procedure: LEFT HEART CATH AND CORONARY ANGIOGRAPHY;  Surgeon: Swaziland, Peter M, MD;  Location: [facility] INVASIVE CV LAB;  Service: Cardiovascular;  Laterality: N/A;   LOWER EXTREMITY ANGIOGRAPHY Bilateral 05/17/2019   Procedure: LOWER EXTREMITY ANGIOGRAPHY;  Surgeon: Sherren Kerns, MD;  Location: [facility] INVASIVE CV LAB;   Service: Cardiovascular;  Laterality: Bilateral;   PARS PLANA VITRECTOMY Left 01/09/2023   Procedure: PARS PLANA VITRECTOMY WITH 25 GAUGE AND REPAIR OF RETINAL DETACHMENT;  Surgeon: Edmon Crape, MD;  Location: [facility] OR;  Service: Ophthalmology;  Laterality: Left;   REMOVAL OF A DIALYSIS CATHETER Right 01/08/2021   Procedure: REMOVAL OF TEMPORARY DIALYSIS CATHETER, LEFT FEMORAL VEIN;  Surgeon: Chuck Hint, MD;  Location: [facility] OR;  Service: Vascular;  Laterality: Right;   REVISON OF ARTERIOVENOUS FISTULA Right 12/14/2012   Procedure: REVISON OF upper arm ARTERIOVENOUS FISTULA using 37mmx10cm gortex graft;  Surgeon: Fransisco Hertz, MD;  Location: [facility] OR;  Service: Vascular;  Laterality: Right;   REVISON OF ARTERIOVENOUS FISTULA Right 12/27/2020   Procedure: RIGHT UPPER EXTREMITY ARTERIOVENOUS FISTULA REVISiON;  Surgeon: Leonie Douglas, MD;  Location: [facility] OR;  Service: Vascular;  Laterality: Right;  PERIPHERAL NERVE BLOCK   RIGHT/LEFT HEART CATH AND CORONARY ANGIOGRAPHY N/A 08/07/2020  Procedure: RIGHT/LEFT HEART CATH AND CORONARY ANGIOGRAPHY;  Surgeon: Swaziland, Peter M, MD;  Location: [facility] INVASIVE CV LAB;  Service: Cardiovascular;  Laterality: N/A;   SOFT TISSUE MASS EXCISION     Right arm, Left leg  for MRSA infection   Patient Active Problem List   Diagnosis Date Noted   ACS (acute coronary syndrome) (HCC) 09/12/2021   Prolonged QT interval 09/12/2021   Unstable angina (HCC) 08/20/2021   Nuclear sclerotic cataract of right eye 06/11/2021   Nuclear sclerotic cataract of left eye 06/11/2021   Posterior subcapsular age-related cataract, left eye 06/11/2021   Stable treated proliferative diabetic retinopathy of left eye determined by examination associated with type 2 diabetes mellitus (HCC) 06/11/2021   Right epiretinal membrane 06/11/2021   Stable treated proliferative diabetic retinopathy of right eye determined by examination associated with type 2 diabetes mellitus (HCC) 06/11/2021    Hyperkalemia 01/09/2021   ESRD (end stage renal disease) (HCC)    Dialysis AV fistula malfunction, initial encounter (HCC) 01/07/2021   NSTEMI (non-ST elevated myocardial infarction) (HCC) 09/21/2020   3-vessel coronary artery disease 08/09/2020   Pulmonary hypertension, unspecified (HCC) 08/09/2020   Pancytopenia (HCC) 08/06/2020   Allergy, unspecified, subsequent encounter 06/28/2020   Anaphylactic shock, unspecified, subsequent encounter 06/28/2020   Lymphedema of both lower extremities 12/29/2019   Hyperphosphatemia 12/11/2019   Encounter for post surgical wound check 08/16/2019   Post-op pain 08/16/2019   S/P transmetatarsal amputation of foot, right (HCC) 08/16/2019   Unspecified protein-calorie malnutrition (HCC) 07/12/2019   PAD (peripheral artery disease) (HCC) 06/30/2019   Diabetic retinopathy associated with type 2 diabetes mellitus (HCC) 06/23/2019   Other forms of retinal detachment(361.89) 06/23/2019   Secondary hyperparathyroidism (HCC) 06/23/2019   Osteomyelitis, unspecified (HCC) 05/31/2019   Gangrene of toe of right foot (HCC)    Ulcer of right foot, with necrosis of bone (HCC)    Abscess of right foot    Diabetic infection of right foot (HCC)    Ulcer of right second toe with necrosis of bone (HCC)    Cellulitis of right foot 05/22/2019   Acute osteomyelitis of right foot (HCC) 05/15/2019   Osteomyelitis of great toe of right foot (HCC) 05/14/2019   Diabetic foot ulcer- right second toe 05/14/2019   Sepsis (HCC) 05/14/2019   Hypothyroidism, unspecified 01/11/2018   Hypercalcemia 06/14/2016   Dyslipidemia 05/11/2015   Hyperglycemia 05/05/2015   Diarrhea, unspecified 02/01/2015   Fever, unspecified 02/01/2015   Pain, unspecified 02/01/2015   Pruritus, unspecified 02/01/2015   Coagulation defect, unspecified (HCC) 07/07/2014   Encounter for removal of sutures 05/30/2014   Encounter for immunization 04/19/2014   Chest pain 08/23/2013   Type 2 diabetes mellitus  (HCC) 08/23/2013   Hemodialysis status (HCC) 08/23/2013   Chronic kidney disease, stage V (HCC) 08/23/2013   Obesity, unspecified 08/23/2013   Complication of vascular dialysis catheter 12/15/2012   Other fluid overload 12/07/2012   Other specified complication of vascular prosthetic devices, implants and grafts, subsequent encounter 08/06/2012   ESRD on dialysis (HCC) 07/18/2011   Pure hypercholesterolemia 06/23/2011   Iron deficiency anemia, unspecified 06/21/2011   HYPOGLYCEMIA, UNSPECIFIED 11/13/2008   RENAL INSUFFICIENCY 07/31/2008   FOOT ULCER, LEFT 06/29/2008   HYPERLIPIDEMIA 05/16/2008   MRSA INFECTION 10/18/2007   Essential hypertension 10/18/2007    ONSET DATE: 02/12/2023 prosthesis delivery  REFERRING DIAG: 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 He ended up walking in dark and was comfortable thanks to PT with activities. The stretches including counter and doorframe. The spot on right foot is dried up.   PERTINENT HISTORY: left BKA, right TMA, DM2, PAD, CAD, MI, ESRD on HD, moderate AS, HTN, hypothyroidism, anemia, CHF  PAIN:  Are you having pain?   Yes: NPRS scale: this morning 0/10 Pain location: low back, muscles of both LEs Pain description: dull ache, sciatica pain Aggravating factors: walking with lean,  Relieving factors: exercises  Residual limb pain 0/10 mainly at distal tibia  PRECAUTIONS: Fall and Other: No BP RUE  WEIGHT BEARING RESTRICTIONS: No  FALLS: Has patient fallen in last 6 months? No  LIVING ENVIRONMENT: Lives with: lives with their  spouse Lives in: House Home Access: Ramped entrance Home layout: Two level, 1/2 bath on main level, and stair lift Stairs: Yes: Internal: 15 steps; on left going up and External: 2-5 steps; can reach both Has following equipment at home: Environmental consultant - 2 wheeled, Wheelchair (manual), [employer], Grab bars, and Ramped entry  PLOF: Independent with household mobility without device and Independent with community mobility without device  PATIENT GOALS:   to use prosthesis to be active including gardening, his yard has hill,   OBJECTIVE:  COGNITION: Overall cognitive status: Within functional limits for tasks assessed   SENSATION: WFL  POSTURE:  Eval / 02/17/2023:  rounded shoulders, forward head, flexed trunk , and weight shift right  LOWER EXTREMITY ROM:  ROM P:passive  A:active Right eval Left eval  Hip flexion    Hip extension Standing -15* Standing  -15*  Hip abduction    Hip adduction    Hip internal rotation    Hip external rotation    Knee flexion    Knee extension    Ankle dorsiflexion    Ankle plantarflexion    Ankle inversion    Ankle eversion     (Blank rows = not tested)  LOWER EXTREMITY MMT:  MMT Right eval Left eval  Hip flexion    Hip extension Functional 4/5 Functional 4/5  Hip abduction Functional 4/5 Functional 4/5  Hip adduction    Hip internal rotation    Hip external rotation    Knee flexion Functional 4/5 Functional 4/5  Knee extension Functional 4/5 Functional 4/5  Ankle dorsiflexion 3-/5 NA  Ankle plantarflexion  NA  Ankle inversion  NA  Ankle eversion  NA  (Blank rows = not tested)  TRANSFERS: Eval / 02/17/2023: Sit to stand: SBA requires use of armrests from 18" chair using back of legs against chair & RW to stabilize Stand to sit: SBA requires use of armrests from 18" chair using back of legs against chair & RW to stabilize  GAIT: 03/26/2023: with cane Gait Velocity 2.13 ft/sec comfortable self-selected pace with SBA & 2.60  ft/sec fast pace with CGA Functional Gait Assessment with cane:    Eval / 02/17/2023: Gait pattern: step to pattern, decreased step length- Right, decreased stance time- Left, decreased hip/knee flexion- Left, decreased ankle dorsiflexion- Right, Left hip hike, Right foot flat, genu recurvatum- Left, antalgic, trendelenburg, lateral hip instability, trunk flexed, abducted- Left, and poor foot clearance- Right excessive BUE weight bearing on RW Distance walked: 100' Assistive device utilized: Environmental consultant - 2  wheeled and TTA prosthesis Level of assistance: SBA  FUNCTIONAL TESTs:  Eval / 02/17/2023:   Sharlene Motts Balance Scale: 11/56   CURRENT PROSTHETIC WEAR ASSESSMENT: 03/25/2023: pt reports wearing prosthesis all awake hours including to dialysis with some distal tibia pain.  He verbalizes how to adjust ply socks, properly donnes and reports proper cleaning.   Eval /  02/17/2023: Patient is dependent with: skin check, residual limb care, care of non-amputated limb, prosthetic cleaning, ply sock cleaning, correct ply sock adjustment, proper wear schedule/adjustment, and proper weight-bearing schedule/adjustment Donning prosthesis: SBA Doffing prosthesis: SBA Prosthetic wear tolerance: 4 hours, 1x/day, 4 of 6 days since delivery Prosthetic weight bearing tolerance: 5 minutes with limb pain 8/10 Edema: pitting with 5 sec capillary refill Residual limb condition: cylindrical shape, no open areas, limited to no hair growth, scar dryness, normal color & temperature Prosthetic description: silicon liner with pin lock suspension, total contact socket with flexible inner socket, dynamic response foot K code/activity level with prosthetic use: Level 3    TODAY'S TREATMENT:                                                                                                                             DATE:  04/14/2023: Prosthetic Training with Transtibial Amputation: Pt amb 100' X 2 with cane with verbal cues for  upright posture. Pt neg stairs 11 with left rail & cane RUE descend with step-to alternating lead LE and ascend alternating pattern.  PT reviewed shoe recommendations. Pt verbalized understanding.   Therapeutic Exercise  Standing with cane & chair back support with upright posture. PT updated HEP with demo, verbal & HO cues. Pt verbalized and return demo understanding.  - Standing Hip Flexion with Resistance (Mirrored)  - 1 x daily - 7 x weekly - 1 sets - 10 reps - Standing Hip Adduction with Resistance (Mirrored)  - 1 x daily - 7 x weekly - 1 sets - 10 reps - Standing Hip Extension with Resistance  - 1 x daily - 7 x weekly - 1 sets - 10 reps - Standing Hip Abduction with Theraband Resistance  - 1 x daily - 7 x weekly - 1 sets - 10 reps - standing knee extension  - 1 x daily - 7 x weekly - 1 sets - 10 reps - 5 seconds hold  04/09/2023: Prosthetic Training with Transtibial Amputation: He wore prosthesis all awake hours even when not feeling well.   Pt amb 300' with cane working on scanning & eyes closed to simulate low vision situations.   Therapeutic exercise: PT demo, verbal & HO cues on HEP for gastroc, soleus & eversion stretches.  Stretch to facilitate upright posture at door frame reaching single UE & BUE overhead and standing with posterior pelvis to counter.  Pt verbalized & return demo understanding of HEP see below.  03/26/2023: Prosthetic Training with Transtibial Amputation: See objective data for gait & prosthetic wear. PT reviewed results with pt who  reports understanding.  Pt amb 300' with cane with SBA / CGA.   PT demo & verbal cues on stepping over obstacle. Pt performed inside //bars initially with BUE //bars, then RUE bar then with cane.  Pt neg curb with cane with minA.  Neuromuscular Re-education: Standing crossways on foam beam working on hip & ankle/residual limb strategies:  head turns eyes open feet hip width with minA & intermittent touch //bars.  Step strategy  anticipatory stepping off beam & stabilizing with BLEs anteriorly, posteriorly & laterally. Pt requires minA or intermittent touch //bars. PT demo & verbal cues on balance in corner with chair back in front for safety. Pt verbalized understanding.     HOME EXERCISE PROGRAM: Access Code: [email or web address] URL: [email or web address] Date: 04/14/2023 Prepared by: Vladimir Faster  Exercises - standing calf stretch with forefoot on small step or brick  - 2 x daily - 7 x weekly - 1 sets - 3 reps - 30 seconds hold - Standing Soleus Stretch on Foam 1/2 Roll  - 2 x daily - 7 x weekly - 1 sets - 13 reps - 30 seconds hold - Upright Stance at Door Frame Single Arm  - 1-3 x daily - 7 x weekly - 1 sets - 2 reps - 2 deep breathes hold - Upright Stance at Door Frame with Both Arms  - 1-3 x daily - 7 x weekly - 1 sets - 2 reps - 2 deep breathes hold - Standing posture with back to counter  - 1 x daily - 5 x weekly - 1 sets - 10 reps - 5 seconds hold - Walk with Head Turns  - 1 x daily - 7 x weekly - 1 sets - 10 reps - Walking with Eyes Closed  - 1 x daily - 7 x weekly - 1 sets - 10 reps - Standing Hip Flexion with Resistance (Mirrored)  - 1 x daily - 7 x weekly - 1 sets - 10 reps - Standing Hip Adduction with Resistance (Mirrored)  - 1 x daily - 7 x weekly - 1 sets - 10 reps - Standing Hip Extension with Resistance  - 1 x daily - 7 x weekly - 1 sets - 10 reps - Standing Hip Abduction with Theraband Resistance  - 1 x daily - 7 x weekly - 1 sets - 10 reps - standing knee extension  - 1 x daily - 7 x weekly - 1 sets - 10 reps - 5 seconds hold  ASSESSMENT: CLINICAL IMPRESSION: Patient appears to understand HEP for LE theraband exercises.   Patient continues to benefit from skilled PT.  OBJECTIVE IMPAIRMENTS: Abnormal gait, decreased activity tolerance, decreased balance, decreased knowledge of condition, decreased knowledge of use of DME, decreased mobility, difficulty walking, decreased strength,  increased edema, impaired flexibility, postural dysfunction, prosthetic dependency , and pain.   ACTIVITY LIMITATIONS: carrying, lifting, bending, standing, squatting, stairs, transfers, and locomotion level  PARTICIPATION LIMITATIONS: meal prep, cleaning, driving, community activity, and yard work  PERSONAL FACTORS: Education, Fitness, Past/current experiences, Time since onset of injury/illness/exacerbation, and 3+ comorbidities: see PMH  are also affecting patient's functional outcome.   REHAB POTENTIAL: Good  CLINICAL DECISION MAKING: Evolving/moderate complexity  EVALUATION COMPLEXITY: Moderate   GOALS: Goals reviewed with patient? Yes  SHORT TERM GOALS: Target date: 03/19/2023  Patient donnes prosthesis modified independent & verbalizes proper cleaning. Baseline: SEE OBJECTIVE DATA Goal status:  MET 03/24/2023 2.  Patient tolerates prosthesis >12 hrs total /day without skin issues or limb pain <  5/10 after standing. Baseline: SEE OBJECTIVE DATA Goal status: MET 03/24/2023  3.  Patient able to stand 30 seconds without UE support with supervision. Baseline: SEE OBJECTIVE DATA Goal status: MET 03/24/2023  4. Patient ambulates 11' with RW & prosthesis with supervision. Baseline: SEE OBJECTIVE DATA Goal status: MET 03/24/2023  5. Patient negotiates ramps & curbs with RW & prosthesis with supervision. Baseline: SEE OBJECTIVE DATA Goal status: MET 03/24/2023  LONG TERM GOALS: Target date: 05/14/2023  Patient demonstrates & verbalized understanding of prosthetic care to enable safe utilization of prosthesis. Baseline: SEE OBJECTIVE DATA Goal status: Ongoing 04/14/2023  Patient tolerates prosthesis wear >90% of awake hours without skin or limb pain issues. Baseline: SEE OBJECTIVE DATA Goal status: Ongoing 04/14/2023  Berg Balance >/= 30 / 56 to indicate lower fall risk Baseline: SEE OBJECTIVE DATA Goal status: 04/14/2023  Patient ambulates >500' with prosthesis & LRAD  independently Baseline: SEE OBJECTIVE DATA Goal status: 04/14/2023  Patient negotiates ramps, curbs & stairs with single rail with prosthesis & LRAD independently. Baseline: SEE OBJECTIVE DATA Goal status: 04/14/2023   PLAN:  PT FREQUENCY: 2x/week  PT DURATION: 12 weeks  PLANNED INTERVENTIONS: Therapeutic exercises, Therapeutic activity, Neuromuscular re-education, Balance training, Gait training, Patient/Family education, Self Care, Stair training, Prosthetic training, DME instructions, Re-evaluation, and physical performance testing  PLAN FOR NEXT SESSION:  check Tband LE HEP,  gait training with cane including ramps, curbs &stairs.  Continue standing balance activities facilitating balance strategies.    Vladimir Faster, PT, DPT 04/14/2023, 3:52 PM

## 2023-04-16 ENCOUNTER — Encounter: Payer: Self-pay | Admitting: Physical Therapy

## 2023-04-16 ENCOUNTER — Ambulatory Visit (INDEPENDENT_AMBULATORY_CARE_PROVIDER_SITE_OTHER): Payer: Medicare Other | Admitting: Physical Therapy

## 2023-04-16 DIAGNOSIS — M6281 Muscle weakness (generalized): Secondary | ICD-10-CM | POA: Diagnosis not present

## 2023-04-16 DIAGNOSIS — R2681 Unsteadiness on feet: Secondary | ICD-10-CM

## 2023-04-16 DIAGNOSIS — M79662 Pain in left lower leg: Secondary | ICD-10-CM

## 2023-04-16 DIAGNOSIS — R2689 Other abnormalities of gait and mobility: Secondary | ICD-10-CM

## 2023-04-16 NOTE — Therapy (Signed)
OUTPATIENT PHYSICAL THERAPY PROSTHETICS TREATMENT   Patient Name: [name] MRN: [id number] DOB:14-Aug-1966, 57 y.o., male Today's Date: 04/16/2023  PCP: [name], MD REFERRING PROVIDER: Marcello Fennel, MD  END OF SESSION:  PT End of Session - 04/16/23 1006     Visit Number 13    Number of Visits 24    Date for PT Re-Evaluation 05/14/23    Authorization Type Medicare & BCBS    Progress Note Due on Visit 20    PT Start Time 1011    PT Stop Time 1058    PT Time Calculation (min) 47 min    Equipment Utilized During Treatment Gait belt    Activity Tolerance Patient tolerated treatment well;Patient limited by pain    Behavior During Therapy [facility] for tasks assessed/performed                  Past Medical History:  Diagnosis Date   Acute osteomyelitis of right foot (HCC) 05/15/2019   Anemia 06/23/2019   Aortic stenosis    Bursitis    right shoulder   Complication of anesthesia    Coronary artery disease    Diabetes with renal manifestations(250.4) 08/23/2013   Diabetic infection of right foot (HCC)    Diabetic retinopathy associated with type 2 diabetes mellitus (HCC) 06/23/2019   ESRD on hemodialysis (HCC) 07/18/2011   Essential hypertension 10/18/2007   Qualifier: Diagnosis of  By: Everardo All MD, Gregary Signs A    History of blood transfusion    HYPERLIPIDEMIA 05/16/2008   Qualifier: Diagnosis of  By: Jonny Ruiz MD, Len Blalock    MRSA INFECTION 10/18/2007   Qualifier: Diagnosis of  By: Tyrone Apple, Lucy     Other forms of retinal detachment(361.89) 06/23/2019   PAD (peripheral artery disease) (HCC)    PONV (postoperative nausea and vomiting)    Prolonged QT interval    Pulmonary hypertension (HCC)    Secondary hyperparathyroidism (HCC) 06/23/2019   Sepsis (HCC) 05/14/2019   Past Surgical History:  Procedure Laterality Date   ABDOMINAL AORTOGRAM N/A 05/17/2019   Procedure: ABDOMINAL AORTOGRAM;  Surgeon: Sherren Kerns, MD;  Location: [facility] INVASIVE CV LAB;  Service:  Cardiovascular;  Laterality: N/A;   AMPUTATION TOE Right 05/14/2019   Procedure: PARTIAL AMPUTATION SECOND TOE RIGHT FOOT;  Surgeon: Park Liter, DPM;  Location: [facility] OR;  Service: Podiatry;  Laterality: Right;   AMPUTATION TOE Right 05/23/2019   Procedure: AMPUTATION OF SECOND TOE METATARSAL PHALANGEAL JOINT;  Surgeon: Park Liter, DPM;  Location: [facility] OR;  Service: Podiatry;  Laterality: Right;   AMPUTATION TOE Right 05/27/2019   Procedure: Amputation Toe;  Surgeon: Park Liter, DPM;  Location: [facility] OR;  Service: Podiatry;  Laterality: Right;  right third toe   APPLICATION OF WOUND VAC Right 05/27/2019   Procedure: Application Of Wound Vac;  Surgeon: Park Liter, DPM;  Location: [facility] OR;  Service: Podiatry;  Laterality: Right;   AV FISTULA PLACEMENT  06-18-11   Right brachiocephalic AVF   BONE BIOPSY Right 05/14/2019   Procedure: OPEN SUPERFICIAL BONE BIOPSY GREAT TOE;  Surgeon: Park Liter, DPM;  Location: [facility] OR;  Service: Podiatry;  Laterality: Right;   CARDIAC CATHETERIZATION     CORONARY BALLOON ANGIOPLASTY N/A 09/24/2020   Procedure: CORONARY BALLOON ANGIOPLASTY;  Surgeon: Swaziland, Peter M, MD;  Location: [facility] INVASIVE CV LAB;  Service: Cardiovascular;  Laterality: N/A;   CORONARY PRESSURE/FFR STUDY N/A 09/24/2020   Procedure: INTRAVASCULAR PRESSURE WIRE/FFR STUDY;  Surgeon: Swaziland, Peter  M, MD;  Location: [facility] IN[facility] CV LAB;  Service: Cardiovascular;  Laterality: N/A;   CORONARY STENT INTERVENTION N/A 09/24/2020   Procedure: CORONARY STENT INTERVENTION;  Surgeon: Swaziland, Peter M, MD;  Location: [facility] INVASIVE CV LAB;  Service: Cardiovascular;  Laterality: N/A;   EYE SURGERY     left eye for Laser, diabetic retinopathy   FISTULOGRAM Right 11/06/2011   Procedure: FISTULOGRAM;  Surgeon: Fransisco Hertz, MD;  Location: [facility] CATH LAB;  Service: Cardiovascular;  Laterality: Right;   HEMATOMA EVACUATION Right Feb. 25, 2014   HEMATOMA EVACUATION Right 12/14/2012   Procedure: EVACUATION HEMATOMA;   Surgeon: Fransisco Hertz, MD;  Location: [facility] OR;  Service: Vascular;  Laterality: Right;   INCISION AND DRAINAGE Right 05/23/2019   Procedure: INCISION AND DRAINAGE OF RIGHT FOOT DEEP SPACE ABSCESS;  Surgeon: Park Liter, DPM;  Location: [facility] OR;  Service: Podiatry;  Laterality: Right;   INJECTION OF SILICONE OIL Left 01/09/2023   Procedure: INJECTION OF SILICONE OIL;  Surgeon: Edmon Crape, MD;  Location: [facility] OR;  Service: Ophthalmology;  Laterality: Left;   INSERTION OF DIALYSIS CATHETER  12/14/2012   Procedure: INSERTION OF DIALYSIS CATHETER;  Surgeon: Fransisco Hertz, MD;  Location: [facility] OR;  Service: Vascular;;   INSERTION OF DIALYSIS CATHETER Right 01/07/2021   Procedure: INSERTION OF DIALYSIS CATHETER;  Surgeon: Sherren Kerns, MD;  Location: [facility] OR;  Service: Vascular;  Laterality: Right;   INSERTION OF DIALYSIS CATHETER Left 01/08/2021   Procedure: INSERTION OF LEFT INTERNAL JUGULAR DIALYSIS CATHETER, tunneled;  Surgeon: Chuck Hint, MD;  Location: [name] OR;  Service: Vascular;  Laterality: Left;   IRRIGATION AND DEBRIDEMENT FOOT Right 05/25/2019   Procedure: INCISION AND DRAINAGE FOOT;  Surgeon: Park Liter, DPM;  [facility]: [facility] OR;  Service: Podiatry;  Laterality: Right;   IRRIGATION AND DEBRIDEMENT FOOT Right 05/27/2019   Procedure: IRRIGATION AND DEBRIDEMENT FOOT;  Surgeon: Park Liter, DPM;  Location: [facility] OR;  Service: Podiatry;  Laterality: Right;   LASER PHOTO ABLATION Left 01/09/2023   Procedure: LASER PHOTO ABLATION;  Surgeon: Edmon Crape, MD;  Location: [facility] OR;  Service: Ophthalmology;  Laterality: Left;   LEFT HEART CATH AND CORONARY ANGIOGRAPHY N/A 09/24/2020   Procedure: LEFT HEART CATH AND CORONARY ANGIOGRAPHY;  Surgeon: Swaziland, Peter M, MD;  Location: [facility] INVASIVE CV LAB;  Service: Cardiovascular;  Laterality: N/A;   LOWER EXTREMITY ANGIOGRAPHY Bilateral 05/17/2019   Procedure: LOWER EXTREMITY ANGIOGRAPHY;  Surgeon: Sherren Kerns, MD;  [facility]: [facility] INVAS[facility] CV LAB;   Service: Cardiovascular;  Laterality: Bilateral;   PARS PLANA VITRECTOMY Left 01/09/2023   Procedure: PARS PLANA VITRECTOMY WITH 25 GAUGE AND REPAIR OF RETINAL DETACHMENT;  Surgeon: Edmon Crape, MD;  Location: [facility] OR;  Service: Ophthalmology;  Laterality: Left;   REMOVAL OF A DIALYSIS CATHETER Right 01/08/2021   Procedure: REMOVAL OF TEMPORARY DIALYSIS CATHETER, LEFT FEMORAL VEIN;  Surgeon: Chuck Hint, MD;  Location: [facility] OR;  Service: Vascular;  Laterality: Right;   REVISON OF ARTERIOVENOUS FISTULA Right 12/14/2012   Procedure: REVISON OF upper arm ARTERIOVENOUS FISTULA using 37mmx10cm gortex graft;  Surgeon: Fransisco Hertz, MD;  Location: [facility] OR;  Service: Vascular;  Laterality: Right;   REVISON OF ARTERIOVENOUS FISTULA Right 12/27/2020   Procedure: RIGHT UPPER EXTREMITY ARTERIOVENOUS FISTULA REVISiON;  Surgeon: Leonie Douglas, MD;  Location: [facility] OR;  Service: Vascular;  Laterality: Right;  PERIPHERAL NERVE BLOCK   RIGHT/LEFT HEART CATH AND CORONARY ANGIOGRAPHY N/A 08/07/2020  Procedure: RIGHT/LEFT HEART CATH AND CORONARY ANGIOGRAPHY;  Surgeon: Swaziland, Peter M, MD;  Location: [facility] INVASIVE CV LAB;  Service: Cardiovascular;  Laterality: N/A;   SOFT TISSUE MASS EXCISION     Right arm, Left leg  for MRSA infection   Patient Active Problem List   Diagnosis Date Noted   ACS (acute coronary syndrome) (HCC) 09/12/2021   Prolonged QT interval 09/12/2021   Unstable angina (HCC) 08/20/2021   Nuclear sclerotic cataract of right eye 06/11/2021   Nuclear sclerotic cataract of left eye 06/11/2021   Posterior subcapsular age-related cataract, left eye 06/11/2021   Stable treated proliferative diabetic retinopathy of left eye determined by examination associated with type 2 diabetes mellitus (HCC) 06/11/2021   Right epiretinal membrane 06/11/2021   Stable treated proliferative diabetic retinopathy of right eye determined by examination associated with type 2 diabetes mellitus (HCC) 06/11/2021    Hyperkalemia 01/09/2021   ESRD (end stage renal disease) (HCC)    Dialysis AV fistula malfunction, initial encounter (HCC) 01/07/2021   NSTEMI (non-ST elevated myocardial infarction) (HCC) 09/21/2020   3-vessel coronary artery disease 08/09/2020   Pulmonary hypertension, unspecified (HCC) 08/09/2020   Pancytopenia (HCC) 08/06/2020   Allergy, unspecified, subsequent encounter 06/28/2020   Anaphylactic shock, unspecified, subsequent encounter 06/28/2020   Lymphedema of both lower extremities 12/29/2019   Hyperphosphatemia 12/11/2019   Encounter for post surgical wound check 08/16/2019   Post-op pain 08/16/2019   S/P transmetatarsal amputation of foot, right (HCC) 08/16/2019   Unspecified protein-calorie malnutrition (HCC) 07/12/2019   PAD (peripheral artery disease) (HCC) 06/30/2019   Diabetic retinopathy associated with type 2 diabetes mellitus (HCC) 06/23/2019   Other forms of retinal detachment(361.89) 06/23/2019   Secondary hyperparathyroidism (HCC) 06/23/2019   Osteomyelitis, unspecified (HCC) 05/31/2019   Gangrene of toe of right foot (HCC)    Ulcer of right foot, with necrosis of bone (HCC)    Abscess of right foot    Diabetic infection of right foot (HCC)    Ulcer of right second toe with necrosis of bone (HCC)    Cellulitis of right foot 05/22/2019   Acute osteomyelitis of right foot (HCC) 05/15/2019   Osteomyelitis of great toe of right foot (HCC) 05/14/2019   Diabetic foot ulcer- right second toe 05/14/2019   Sepsis (HCC) 05/14/2019   Hypothyroidism, unspecified 01/11/2018   Hypercalcemia 06/14/2016   Dyslipidemia 05/11/2015   Hyperglycemia 05/05/2015   Diarrhea, unspecified 02/01/2015   Fever, unspecified 02/01/2015   Pain, unspecified 02/01/2015   Pruritus, unspecified 02/01/2015   Coagulation defect, unspecified (HCC) 07/07/2014   Encounter for removal of sutures 05/30/2014   Encounter for immunization 04/19/2014   Chest pain 08/23/2013   Type 2 diabetes mellitus  (HCC) 08/23/2013   Hemodialysis status (HCC) 08/23/2013   Chronic kidney disease, stage V (HCC) 08/23/2013   Obesity, unspecified 08/23/2013   Complication of vascular dialysis catheter 12/15/2012   Other fluid overload 12/07/2012   Other specified complication of vascular prosthetic devices, implants and grafts, subsequent encounter 08/06/2012   ESRD on dialysis (HCC) 07/18/2011   Pure hypercholesterolemia 06/23/2011   Iron deficiency anemia, unspecified 06/21/2011   HYPOGLYCEMIA, UNSPECIFIED 11/13/2008   RENAL INSUFFICIENCY 07/31/2008   FOOT ULCER, LEFT 06/29/2008   HYPERLIPIDEMIA 05/16/2008   MRSA INFECTION 10/18/2007   Essential hypertension 10/18/2007    ONSET DATE: 02/12/2023 prosthesis delivery  REFERRING DIAG: 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  He does not have extra fluid to pull off at dialysis.  He thinks it is more related to diet as does not eat meat in summer. He did band exercises without issues.   PERTINENT HISTORY: left BKA, right TMA, DM2, PAD, CAD, MI, ESRD on HD, moderate AS, HTN, hypothyroidism, anemia, CHF  PAIN:  Are you having pain?   Yes: NPRS scale: this morning 0/10 Pain location: low back, muscles of both LEs Pain description: dull ache, sciatica pain Aggravating factors: walking with lean,  Relieving factors: exercises  Residual limb pain 0/10 mainly at distal tibia  PRECAUTIONS: Fall and Other: No BP RUE  WEIGHT BEARING RESTRICTIONS: No  FALLS: Has patient fallen in last 6 months? No  LIVING ENVIRONMENT: Lives with: lives with their  spouse Lives in: House Home Access: Ramped entrance Home layout: Two level, 1/2 bath on main level, and stair lift Stairs: Yes: Internal: 15 steps; on left going up and External: 2-5 steps; can reach both Has following equipment at home: Environmental consultant - 2 wheeled, Wheelchair (manual), [employer], Grab bars, and Ramped entry  PLOF: Independent with household mobility without device and Independent with community mobility without device  PATIENT GOALS:   to use prosthesis to be active including gardening, his yard has hill,   OBJECTIVE:  COGNITION: Overall cognitive status: Within functional limits for tasks assessed   SENSATION: WFL  POSTURE:  Eval / 02/17/2023:  rounded shoulders, forward head, flexed trunk , and weight shift right  LOWER EXTREMITY ROM:  ROM P:passive  A:active Right eval Left eval  Hip flexion    Hip extension Standing -15* Standing  -15*  Hip abduction    Hip adduction    Hip internal rotation    Hip external rotation    Knee flexion    Knee extension    Ankle dorsiflexion    Ankle plantarflexion    Ankle inversion    Ankle eversion     (Blank rows = not tested)  LOWER EXTREMITY MMT:  MMT Right eval Left eval  Hip flexion    Hip extension Functional 4/5 Functional 4/5  Hip abduction Functional 4/5 Functional 4/5  Hip adduction    Hip internal rotation    Hip external rotation    Knee flexion Functional 4/5 Functional 4/5  Knee extension Functional 4/5 Functional 4/5  Ankle dorsiflexion 3-/5 NA  Ankle plantarflexion  NA  Ankle inversion  NA  Ankle eversion  NA  (Blank rows = not tested)  TRANSFERS: Eval / 02/17/2023: Sit to stand: SBA requires use of armrests from 18" chair using back of legs against chair & RW to stabilize Stand to sit: SBA requires use of armrests from 18" chair using back of legs against chair & RW to stabilize  GAIT: 03/26/2023: with cane Gait Velocity 2.13 ft/sec comfortable self-selected pace with SBA & 2.60  ft/sec fast pace with CGA Functional Gait Assessment with cane:    Eval / 02/17/2023: Gait pattern: step to pattern, decreased step length- Right, decreased stance time- Left, decreased hip/knee flexion- Left, decreased ankle dorsiflexion- Right, Left hip hike, Right foot flat, genu recurvatum- Left, antalgic, trendelenburg, lateral hip instability, trunk flexed, abducted- Left, and poor foot clearance- Right excessive BUE weight bearing on RW Distance walked: 100'  Assistive device utilized: Environmental consultant - 2 wheeled and TTA prosthesis Level of assistance: SBA  FUNCTIONAL TESTs:  Eval / 4/30/2024Sharlene Motts Balance Scale: 11/56   CURRENT PROSTHETIC WEAR ASSESSMENT: 03/25/2023: pt reports wearing prosthesis all awake hours including to dialysis with some distal tibia pain.  He verbalizes how to adjust ply socks, properly donnes and reports proper cleaning.   Eval /  02/17/2023: Patient is dependent with: skin check, residual limb care, care of non-amputated limb, prosthetic cleaning, ply sock cleaning, correct ply sock adjustment, proper wear schedule/adjustment, and proper weight-bearing schedule/adjustment Donning prosthesis: SBA Doffing prosthesis: SBA Prosthetic wear tolerance: 4 hours, 1x/day, 4 of 6 days since delivery Prosthetic weight bearing tolerance: 5 minutes with limb pain 8/10 Edema: pitting with 5 sec capillary refill Residual limb condition: cylindrical shape, no open areas, limited to no hair growth, scar dryness, normal color & temperature Prosthetic description: silicon liner with pin lock suspension, total contact socket with flexible inner socket, dynamic response foot K code/activity level with prosthetic use: Level 3    TODAY'S TREATMENT:                                                                                                                             DATE:  04/16/2023: Prosthetic Training with Transtibial Amputation: Pt amb 300' X 2 with cane stand alone tip with  supervision working on scanning maintaining path/pace, increasing pace and eyes closed to simulate the dark.  He improved from last time worked on these skills but still has slight change in path and pace more than normal.  Pt neg ramp with cane with SBA. PT demo & verbal cues on weight shift over prosthesis in stance with shorter step and maintaining momentum.  Pt neg curb with cane with SBA with verbal cues on technique to maintain momentum.   Therapeutic Exercise: Pt reports no issues with LE theraband kicks. PT demo & verbal cues on step up & step down forward facing with BLEs on 4" step with counter & cane support. Pt performed 5 reps ea LE with improved prosthetic LE knee control.  PT demo & verbal cues on step up & step down side stepping both to right & left with BLEs on 4" step with counter & cane support. Pt performed 5 reps ea LE with improved prosthetic LE knee control.    04/14/2023: Prosthetic Training with Transtibial Amputation: Pt amb 100' X 2 with cane with verbal cues for upright posture. Pt neg stairs 11 with left rail & cane RUE descend with step-to alternating lead LE and ascend alternating pattern.  PT reviewed shoe recommendations. Pt verbalized understanding.   Therapeutic Exercise  Standing with cane & chair back support with upright posture. PT updated HEP with demo, verbal & HO cues. Pt verbalized and return demo understanding.  - Standing Hip Flexion with Resistance (Mirrored)  - 1 x daily - 7 x weekly - 1 sets - 10 reps - Standing Hip  Adduction with Resistance (Mirrored)  - 1 x daily - 7 x weekly - 1 sets - 10 reps - Standing Hip Extension with Resistance  - 1 x daily - 7 x weekly - 1 sets - 10 reps - Standing Hip Abduction with Theraband Resistance  - 1 x daily - 7 x weekly - 1 sets - 10 reps - standing knee extension  - 1 x daily - 7 x weekly - 1 sets - 10 reps - 5 seconds hold  04/09/2023: Prosthetic Training with Transtibial Amputation: He wore prosthesis all  awake hours even when not feeling well.   Pt amb 300' with cane working on scanning & eyes closed to simulate low vision situations.   Therapeutic exercise: PT demo, verbal & HO cues on HEP for gastroc, soleus & eversion stretches.  Stretch to facilitate upright posture at door frame reaching single UE & BUE overhead and standing with posterior pelvis to counter.  Pt verbalized & return demo understanding of HEP see below.   HOME EXERCISE PROGRAM: Access Code: [email or web address] URL: [email or web address] Date: 04/14/2023 Prepared by: Vladimir Faster  Exercises - standing calf stretch with forefoot on small step or brick  - 2 x daily - 7 x weekly - 1 sets - 3 reps - 30 seconds hold - Standing Soleus Stretch on Foam 1/2 Roll  - 2 x daily - 7 x weekly - 1 sets - 13 reps - 30 seconds hold - Upright Stance at Door Frame Single Arm  - 1-3 x daily - 7 x weekly - 1 sets - 2 reps - 2 deep breathes hold - Upright Stance at Door Frame with Both Arms  - 1-3 x daily - 7 x weekly - 1 sets - 2 reps - 2 deep breathes hold - Standing posture with back to counter  - 1 x daily - 5 x weekly - 1 sets - 10 reps - 5 seconds hold - Walk with Head Turns  - 1 x daily - 7 x weekly - 1 sets - 10 reps - Walking with Eyes Closed  - 1 x daily - 7 x weekly - 1 sets - 10 reps - Standing Hip Flexion with Resistance (Mirrored)  - 1 x daily - 7 x weekly - 1 sets - 10 reps - Standing Hip Adduction with Resistance (Mirrored)  - 1 x daily - 7 x weekly - 1 sets - 10 reps - Standing Hip Extension with Resistance  - 1 x daily - 7 x weekly - 1 sets - 10 reps - Standing Hip Abduction with Theraband Resistance  - 1 x daily - 7 x weekly - 1 sets - 10 reps - standing knee extension  - 1 x daily - 7 x weekly - 1 sets - 10 reps - 5 seconds hold  ASSESSMENT: CLINICAL IMPRESSION: Patient has improved normal functional tasks performed while ambulating.  He improved his ability to neg ramp & curb with cane.  He appears to understand  step up exercises which will help knee control.  Patient continues to benefit from skilled PT.  OBJECTIVE IMPAIRMENTS: Abnormal gait, decreased activity tolerance, decreased balance, decreased knowledge of condition, decreased knowledge of use of DME, decreased mobility, difficulty walking, decreased strength, increased edema, impaired flexibility, postural dysfunction, prosthetic dependency , and pain.   ACTIVITY LIMITATIONS: carrying, lifting, bending, standing, squatting, stairs, transfers, and locomotion level  PARTICIPATION LIMITATIONS: meal prep, cleaning, driving, community activity, and yard work  PERSONAL FACTORS: Education, Fitness, Past/current experiences, Time since  onset of injury/illness/exacerbation, and 3+ comorbidities: see PMH  are also affecting patient's functional outcome.   REHAB POTENTIAL: Good  CLINICAL DECISION MAKING: Evolving/moderate complexity  EVALUATION COMPLEXITY: Moderate   GOALS: Goals reviewed with patient? Yes  SHORT TERM GOALS: Target date: 03/19/2023  Patient donnes prosthesis modified independent & verbalizes proper cleaning. Baseline: SEE OBJECTIVE DATA Goal status:  MET 03/24/2023 2.  Patient tolerates prosthesis >12 hrs total /day without skin issues or limb pain <5/10 after standing. Baseline: SEE OBJECTIVE DATA Goal status: MET 03/24/2023  3.  Patient able to stand 30 seconds without UE support with supervision. Baseline: SEE OBJECTIVE DATA Goal status: MET 03/24/2023  4. Patient ambulates 4' with RW & prosthesis with supervision. Baseline: SEE OBJECTIVE DATA Goal status: MET 03/24/2023  5. Patient negotiates ramps & curbs with RW & prosthesis with supervision. Baseline: SEE OBJECTIVE DATA Goal status: MET 03/24/2023  LONG TERM GOALS: Target date: 05/14/2023  Patient demonstrates & verbalized understanding of prosthetic care to enable safe utilization of prosthesis. Baseline: SEE OBJECTIVE DATA Goal status: Ongoing 04/14/2023  Patient  tolerates prosthesis wear >90% of awake hours without skin or limb pain issues. Baseline: SEE OBJECTIVE DATA Goal status: Ongoing 04/14/2023  Berg Balance >/= 30 / 56 to indicate lower fall risk Baseline: SEE OBJECTIVE DATA Goal status: 04/14/2023  Patient ambulates >500' with prosthesis & LRAD independently Baseline: SEE OBJECTIVE DATA Goal status: 04/14/2023  Patient negotiates ramps, curbs & stairs with single rail with prosthesis & LRAD independently. Baseline: SEE OBJECTIVE DATA Goal status: 04/14/2023   PLAN:  PT FREQUENCY: 2x/week  PT DURATION: 12 weeks  PLANNED INTERVENTIONS: Therapeutic exercises, Therapeutic activity, Neuromuscular re-education, Balance training, Gait training, Patient/Family education, Self Care, Stair training, Prosthetic training, DME instructions, Re-evaluation, and physical performance testing  PLAN FOR NEXT SESSION:  gait training with cane including ramps, curbs &stairs.  Continue standing balance activities facilitating balance strategies.    Vladimir Faster, PT, DPT 04/16/2023, 12:42 PM

## 2023-04-21 ENCOUNTER — Other Ambulatory Visit: Payer: Self-pay | Admitting: Internal Medicine

## 2023-04-21 ENCOUNTER — Encounter: Payer: Medicare Other | Admitting: Physical Therapy

## 2023-04-21 ENCOUNTER — Ambulatory Visit (INDEPENDENT_AMBULATORY_CARE_PROVIDER_SITE_OTHER): Payer: Medicare Other | Admitting: Physical Therapy

## 2023-04-21 ENCOUNTER — Encounter: Payer: Self-pay | Admitting: Physical Therapy

## 2023-04-21 DIAGNOSIS — R2681 Unsteadiness on feet: Secondary | ICD-10-CM | POA: Diagnosis not present

## 2023-04-21 DIAGNOSIS — M79662 Pain in left lower leg: Secondary | ICD-10-CM | POA: Diagnosis not present

## 2023-04-21 DIAGNOSIS — R2689 Other abnormalities of gait and mobility: Secondary | ICD-10-CM

## 2023-04-21 DIAGNOSIS — M6281 Muscle weakness (generalized): Secondary | ICD-10-CM | POA: Diagnosis not present

## 2023-04-21 NOTE — Therapy (Signed)
OUTPATIENT PHYSICAL THERAPY PROSTHETICS TREATMENT   Patient Name: [name] MRN: [id number] DOB:1966-07-04, 57 y.o., male Today's Date: 04/21/2023  PCP: [name], MD REFERRING PROVIDER: Marcello Fennel, MD  END OF SESSION:  PT End of Session - 04/21/23 0805     Visit Number 14    Number of Visits 24    Date for PT Re-Evaluation 05/14/23    Authorization Type Medicare & BCBS    Progress Note Due on Visit 20    PT Start Time 0804    PT Stop Time 0845    PT Time Calculation (min) 41 min    Equipment Utilized During Treatment Gait belt    Activity Tolerance Patient tolerated treatment well;Patient limited by pain    Behavior During Therapy [facility] for tasks assessed/performed                   Past Medical History:  Diagnosis Date   Acute osteomyelitis of right foot (HCC) 05/15/2019   Anemia 06/23/2019   Aortic stenosis    Bursitis    right shoulder   Complication of anesthesia    Coronary artery disease    Diabetes with renal manifestations(250.4) 08/23/2013   Diabetic infection of right foot (HCC)    Diabetic retinopathy associated with type 2 diabetes mellitus (HCC) 06/23/2019   ESRD on hemodialysis (HCC) 07/18/2011   Essential hypertension 10/18/2007   Qualifier: Diagnosis of  By: Everardo All MD, Gregary Signs A    History of blood transfusion    HYPERLIPIDEMIA 05/16/2008   Qualifier: Diagnosis of  By: Jonny Ruiz MD, Len Blalock    MRSA INFECTION 10/18/2007   Qualifier: Diagnosis of  By: Tyrone Apple, Lucy     Other forms of retinal detachment(361.89) 06/23/2019   PAD (peripheral artery disease) (HCC)    PONV (postoperative nausea and vomiting)    Prolonged QT interval    Pulmonary hypertension (HCC)    Secondary hyperparathyroidism (HCC) 06/23/2019   Sepsis (HCC) 05/14/2019   Past Surgical History:  Procedure Laterality Date   ABDOMINAL AORTOGRAM N/A 05/17/2019   Procedure: ABDOMINAL AORTOGRAM;  Surgeon: Sherren Kerns, MD;  Location: [facility] INVASIVE CV LAB;  Service:  Cardiovascular;  Laterality: N/A;   AMPUTATION TOE Right 05/14/2019   Procedure: PARTIAL AMPUTATION SECOND TOE RIGHT FOOT;  Surgeon: Park Liter, DPM;  Location: [facility] OR;  Service: Podiatry;  Laterality: Right;   AMPUTATION TOE Right 05/23/2019   Procedure: AMPUTATION OF SECOND TOE METATARSAL PHALANGEAL JOINT;  Surgeon: Park Liter, DPM;  Location: [facility] OR;  Service: Podiatry;  Laterality: Right;   AMPUTATION TOE Right 05/27/2019   Procedure: Amputation Toe;  Surgeon: Park Liter, DPM;  Location: [facility] OR;  Service: Podiatry;  Laterality: Right;  right third toe   APPLICATION OF WOUND VAC Right 05/27/2019   Procedure: Application Of Wound Vac;  Surgeon: Park Liter, DPM;  Location: [facility] OR;  Service: Podiatry;  Laterality: Right;   AV FISTULA PLACEMENT  06-18-11   Right brachiocephalic AVF   BONE BIOPSY Right 05/14/2019   Procedure: OPEN SUPERFICIAL BONE BIOPSY GREAT TOE;  Surgeon: Park Liter, DPM;  Location: [facility] OR;  Service: Podiatry;  Laterality: Right;   CARDIAC CATHETERIZATION     CORONARY BALLOON ANGIOPLASTY N/A 09/24/2020   Procedure: CORONARY BALLOON ANGIOPLASTY;  Surgeon: Swaziland, Peter M, MD;  Location: [facility] INVASIVE CV LAB;  Service: Cardiovascular;  Laterality: N/A;   CORONARY PRESSURE/FFR STUDY N/A 09/24/2020   Procedure: INTRAVASCULAR PRESSURE WIRE/FFR STUDY;  Surgeon: Swaziland,  Demetria Pore, MD;  Location: [facility] INVAS[facility] CV LAB;  Service: Cardiovascular;  Laterality: N/A;   CORONARY STENT INTERVENTION N/A 09/24/2020   Procedure: CORONARY STENT INTERVENTION;  Surgeon: Swaziland, Peter M, MD;  Location: [facility] INVASIVE CV LAB;  Service: Cardiovascular;  Laterality: N/A;   EYE SURGERY     left eye for Laser, diabetic retinopathy   FISTULOGRAM Right 11/06/2011   Procedure: FISTULOGRAM;  Surgeon: Fransisco Hertz, MD;  Location: [facility] CATH LAB;  Service: Cardiovascular;  Laterality: Right;   HEMATOMA EVACUATION Right Feb. 25, 2014   HEMATOMA EVACUATION Right 12/14/2012   Procedure: EVACUATION HEMATOMA;   Surgeon: Fransisco Hertz, MD;  Location: [facility] OR;  Service: Vascular;  Laterality: Right;   INCISION AND DRAINAGE Right 05/23/2019   Procedure: INCISION AND DRAINAGE OF RIGHT FOOT DEEP SPACE ABSCESS;  Surgeon: Park Liter, DPM;  Location: [facility] OR;  Service: Podiatry;  Laterality: Right;   INJECTION OF SILICONE OIL Left 01/09/2023   Procedure: INJECTION OF SILICONE OIL;  Surgeon: Edmon Crape, MD;  Location: [facility] OR;  Service: Ophthalmology;  Laterality: Left;   INSERTION OF DIALYSIS CATHETER  12/14/2012   Procedure: INSERTION OF DIALYSIS CATHETER;  Surgeon: Fransisco Hertz, MD;  Location: [facility] OR;  Service: Vascular;;   INSERTION OF DIALYSIS CATHETER Right 01/07/2021   Procedure: INSERTION OF DIALYSIS CATHETER;  Surgeon: Sherren Kerns, MD;  Location: [facility] OR;  Service: Vascular;  Laterality: Right;   INSERTION OF DIALYSIS CATHETER Left 01/08/2021   Procedure: INSERTION OF LEFT INTERNAL JUGULAR DIALYSIS CATHETER, tunneled;  Surgeon: Chuck Hint, MD;  Location: [facility] OR;  Service: Vascular;  Laterality: Left;   IRRIGATION AND DEBRIDEMENT FOOT Right 05/25/2019   Procedure: INCISION AND DRAINAGE FOOT;  Surgeon: Park Liter, DPM;  [facility]: [facility] OR;  Service: Podiatry;  Laterality: Right;   IRRIGATION AND DEBRIDEMENT FOOT Right 05/27/2019   Procedure: IRRIGATION AND DEBRIDEMENT FOOT;  Surgeon: Park Liter, DPM;  Location: [facility] OR;  Service: Podiatry;  Laterality: Right;   LASER PHOTO ABLATION Left 01/09/2023   Procedure: LASER PHOTO ABLATION;  Surgeon: Edmon Crape, MD;  Location: [facility] OR;  Service: Ophthalmology;  Laterality: Left;   LEFT HEART CATH AND CORONARY ANGIOGRAPHY N/A 09/24/2020   Procedure: LEFT HEART CATH AND CORONARY ANGIOGRAPHY;  Surgeon: Swaziland, Peter M, MD;  Location: [facility] INVASIVE CV LAB;  Service: Cardiovascular;  Laterality: N/A;   LOWER EXTREMITY ANGIOGRAPHY Bilateral 05/17/2019   Procedure: LOWER EXTREMITY ANGIOGRAPHY;  Surgeon: Sherren Kerns, MD;  Location: [facility] INVASIVE CV LAB;   Service: Cardiovascular;  Laterality: Bilateral;   PARS PLANA VITRECTOMY Left 01/09/2023   Procedure: PARS PLANA VITRECTOMY WITH 25 GAUGE AND REPAIR OF RETINAL DETACHMENT;  Surgeon: Edmon Crape, MD;  Location: [facility] OR;  Service: Ophthalmology;  Laterality: Left;   REMOVAL OF A DIALYSIS CATHETER Right 01/08/2021   Procedure: REMOVAL OF TEMPORARY DIALYSIS CATHETER, LEFT FEMORAL VEIN;  Surgeon: Chuck Hint, MD;  Location: [facility] OR;  Service: Vascular;  Laterality: Right;   REVISON OF ARTERIOVENOUS FISTULA Right 12/14/2012   Procedure: REVISON OF upper arm ARTERIOVENOUS FISTULA using 18mmx10cm gortex graft;  Surgeon: Fransisco Hertz, MD;  Location: [facility] OR;  Service: Vascular;  Laterality: Right;   REVISON OF ARTERIOVENOUS FISTULA Right 12/27/2020   Procedure: RIGHT UPPER EXTREMITY ARTERIOVENOUS FISTULA REVISiON;  Surgeon: Leonie Douglas, MD;  Location: [facility] OR;  Service: Vascular;  Laterality: Right;  PERIPHERAL NERVE BLOCK   RIGHT/LEFT HEART CATH AND CORONARY ANGIOGRAPHY N/A 08/07/2020  Procedure: RIGHT/LEFT HEART CATH AND CORONARY ANGIOGRAPHY;  Surgeon: Swaziland, Peter M, MD;  Location: [facility] INVASIVE CV LAB;  Service: Cardiovascular;  Laterality: N/A;   SOFT TISSUE MASS EXCISION     Right arm, Left leg  for MRSA infection   Patient Active Problem List   Diagnosis Date Noted   ACS (acute coronary syndrome) (HCC) 09/12/2021   Prolonged QT interval 09/12/2021   Unstable angina (HCC) 08/20/2021   Nuclear sclerotic cataract of right eye 06/11/2021   Nuclear sclerotic cataract of left eye 06/11/2021   Posterior subcapsular age-related cataract, left eye 06/11/2021   Stable treated proliferative diabetic retinopathy of left eye determined by examination associated with type 2 diabetes mellitus (HCC) 06/11/2021   Right epiretinal membrane 06/11/2021   Stable treated proliferative diabetic retinopathy of right eye determined by examination associated with type 2 diabetes mellitus (HCC) 06/11/2021    Hyperkalemia 01/09/2021   ESRD (end stage renal disease) (HCC)    Dialysis AV fistula malfunction, initial encounter (HCC) 01/07/2021   NSTEMI (non-ST elevated myocardial infarction) (HCC) 09/21/2020   3-vessel coronary artery disease 08/09/2020   Pulmonary hypertension, unspecified (HCC) 08/09/2020   Pancytopenia (HCC) 08/06/2020   Allergy, unspecified, subsequent encounter 06/28/2020   Anaphylactic shock, unspecified, subsequent encounter 06/28/2020   Lymphedema of both lower extremities 12/29/2019   Hyperphosphatemia 12/11/2019   Encounter for post surgical wound check 08/16/2019   Post-op pain 08/16/2019   S/P transmetatarsal amputation of foot, right (HCC) 08/16/2019   Unspecified protein-calorie malnutrition (HCC) 07/12/2019   PAD (peripheral artery disease) (HCC) 06/30/2019   Diabetic retinopathy associated with type 2 diabetes mellitus (HCC) 06/23/2019   Other forms of retinal detachment(361.89) 06/23/2019   Secondary hyperparathyroidism (HCC) 06/23/2019   Osteomyelitis, unspecified (HCC) 05/31/2019   Gangrene of toe of right foot (HCC)    Ulcer of right foot, with necrosis of bone (HCC)    Abscess of right foot    Diabetic infection of right foot (HCC)    Ulcer of right second toe with necrosis of bone (HCC)    Cellulitis of right foot 05/22/2019   Acute osteomyelitis of right foot (HCC) 05/15/2019   Osteomyelitis of great toe of right foot (HCC) 05/14/2019   Diabetic foot ulcer- right second toe 05/14/2019   Sepsis (HCC) 05/14/2019   Hypothyroidism, unspecified 01/11/2018   Hypercalcemia 06/14/2016   Dyslipidemia 05/11/2015   Hyperglycemia 05/05/2015   Diarrhea, unspecified 02/01/2015   Fever, unspecified 02/01/2015   Pain, unspecified 02/01/2015   Pruritus, unspecified 02/01/2015   Coagulation defect, unspecified (HCC) 07/07/2014   Encounter for removal of sutures 05/30/2014   Encounter for immunization 04/19/2014   Chest pain 08/23/2013   Type 2 diabetes mellitus  (HCC) 08/23/2013   Hemodialysis status (HCC) 08/23/2013   Chronic kidney disease, stage V (HCC) 08/23/2013   Obesity, unspecified 08/23/2013   Complication of vascular dialysis catheter 12/15/2012   Other fluid overload 12/07/2012   Other specified complication of vascular prosthetic devices, implants and grafts, subsequent encounter 08/06/2012   ESRD on dialysis (HCC) 07/18/2011   Pure hypercholesterolemia 06/23/2011   Iron deficiency anemia, unspecified 06/21/2011   HYPOGLYCEMIA, UNSPECIFIED 11/13/2008   RENAL INSUFFICIENCY 07/31/2008   FOOT ULCER, LEFT 06/29/2008   HYPERLIPIDEMIA 05/16/2008   MRSA INFECTION 10/18/2007   Essential hypertension 10/18/2007    ONSET DATE: 02/12/2023 prosthesis delivery  REFERRING DIAG: 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  He has trouble getting up & down from floor.    PERTINENT HISTORY: left BKA, right TMA, DM2, PAD, CAD, MI, ESRD on HD, moderate AS, HTN, hypothyroidism, anemia, CHF  PAIN:  Are you having pain?   Yes: NPRS scale: this morning 0/10 Pain location: low back, muscles of both LEs Pain description: dull ache, sciatica pain Aggravating factors: walking with lean,  Relieving factors: exercises  Residual limb pain 0/10 mainly at distal tibia  PRECAUTIONS: Fall and Other: No BP RUE  WEIGHT BEARING RESTRICTIONS: No  FALLS: Has patient fallen in last 6 months? No  LIVING ENVIRONMENT: Lives with: lives with their spouse Lives in: House Home Access: Ramped entrance Home layout: Two level, 1/2 bath on main  level, and stair lift Stairs: Yes: Internal: 15 steps; on left going up and External: 2-5 steps; can reach both Has following equipment at home: Environmental consultant - 2 wheeled, Wheelchair (manual), [employer], Grab bars, and Ramped entry  PLOF: Independent with household mobility without device and Independent with community mobility without device  PATIENT GOALS:   to use prosthesis to be active including gardening, his yard has hill,   OBJECTIVE:  COGNITION: Overall cognitive status: Within functional limits for tasks assessed   SENSATION: WFL  POSTURE:  Eval / 02/17/2023:  rounded shoulders, forward head, flexed trunk , and weight shift right  LOWER EXTREMITY ROM:  ROM P:passive  A:active Right eval Left eval  Hip flexion    Hip extension Standing -15* Standing  -15*  Hip abduction    Hip adduction    Hip internal rotation    Hip external rotation    Knee flexion    Knee extension    Ankle dorsiflexion    Ankle plantarflexion    Ankle inversion    Ankle eversion     (Blank rows = not tested)  LOWER EXTREMITY MMT:  MMT Right eval Left eval  Hip flexion    Hip extension Functional 4/5 Functional 4/5  Hip abduction Functional 4/5 Functional 4/5  Hip adduction    Hip internal rotation    Hip external rotation    Knee flexion Functional 4/5 Functional 4/5  Knee extension Functional 4/5 Functional 4/5  Ankle dorsiflexion 3-/5 NA  Ankle plantarflexion  NA  Ankle inversion  NA  Ankle eversion  NA  (Blank rows = not tested)  TRANSFERS: Eval / 02/17/2023: Sit to stand: SBA requires use of armrests from 18" chair using back of legs against chair & RW to stabilize Stand to sit: SBA requires use of armrests from 18" chair using back of legs against chair & RW to stabilize  GAIT: 03/26/2023: with cane Gait Velocity 2.13 ft/sec comfortable self-selected pace with SBA & 2.60 ft/sec fast pace with CGA Functional Gait Assessment with cane:    Eval / 02/17/2023: Gait  pattern: step to pattern, decreased step length- Right, decreased stance time- Left, decreased hip/knee flexion- Left, decreased ankle dorsiflexion- Right, Left hip hike, Right foot flat, genu recurvatum- Left, antalgic, trendelenburg, lateral hip instability, trunk flexed, abducted- Left, and poor foot clearance- Right excessive BUE weight bearing on RW Distance walked: 100' Assistive device utilized: Environmental consultant - 2 wheeled and TTA prosthesis Level of assistance: SBA  FUNCTIONAL TESTs:  Eval /  4/30/2024Sharlene Motts Balance Scale: 11/56   CURRENT PROSTHETIC WEAR ASSESSMENT: 03/25/2023: pt reports wearing prosthesis all awake hours including to dialysis with some distal tibia pain.  He verbalizes how to adjust ply socks, properly donnes and reports proper cleaning.   Eval /  02/17/2023: Patient is dependent with: skin check, residual limb care, care of non-amputated limb, prosthetic cleaning, ply sock cleaning, correct ply sock adjustment, proper wear schedule/adjustment, and proper weight-bearing schedule/adjustment Donning prosthesis: SBA Doffing prosthesis: SBA Prosthetic wear tolerance: 4 hours, 1x/day, 4 of 6 days since delivery Prosthetic weight bearing tolerance: 5 minutes with limb pain 8/10 Edema: pitting with 5 sec capillary refill Residual limb condition: cylindrical shape, no open areas, limited to no hair growth, scar dryness, normal color & temperature Prosthetic description: silicon liner with pin lock suspension, total contact socket with flexible inner socket, dynamic response foot K code/activity level with prosthetic use: Level 3    TODAY'S TREATMENT:                                                                                                                             DATE:  04/21/2023: Prosthetic Training with Transtibial Amputation: PT demo & verbal cues on Correcting rotation of socket on residual limb. Pt verbalized understanding.  PT demo & verbal cues on Floor transfer  pushing on chair seat via half kneeling on RLE. If he falls, first check if injured then check that socket did not rotate on limb prior to getting up.  Pt return demo understanding and verbalized understanding including recommendation for 3 reps daily to build strength & memory for technique.  PT demo & verbal cues for Kneeling using towel roll or folded pillow under right ankle to prevent prolonged pressure on TMA distal limb. Pt verbalized understanding.  Pt amb 150' with cane with PT noting increased flexion of left knee & hip in stance. PT educated in stretches below to address issue.  Therapeutic Exercise: PT demo, verbal & HO cues for stretches to hamstring, hip flexors & quad muscles. See HEP below. Pt return demo 2 reps and verbalized understanding including rationale.     04/16/2023: Prosthetic Training with Transtibial Amputation: Pt amb 300' X 2 with cane stand alone tip with supervision working on scanning maintaining path/pace, increasing pace and eyes closed to simulate the dark.  He improved from last time worked on these skills but still has slight change in path and pace more than normal.  Pt neg ramp with cane with SBA. PT demo & verbal cues on weight shift over prosthesis in stance with shorter step and maintaining momentum.  Pt neg curb with cane with SBA with verbal cues on technique to maintain momentum.   Therapeutic Exercise: Pt reports no issues with LE theraband kicks. PT demo & verbal cues on step up & step down forward facing with BLEs on 4" step with counter & cane support. Pt performed 5 reps ea LE with improved prosthetic  LE knee control.  PT demo & verbal cues on step up & step down side stepping both to right & left with BLEs on 4" step with counter & cane support. Pt performed 5 reps ea LE with improved prosthetic LE knee control.    04/14/2023: Prosthetic Training with Transtibial Amputation: Pt amb 100' X 2 with cane with verbal cues for upright posture. Pt  neg stairs 11 with left rail & cane RUE descend with step-to alternating lead LE and ascend alternating pattern.  PT reviewed shoe recommendations. Pt verbalized understanding.   Therapeutic Exercise  Standing with cane & chair back support with upright posture. PT updated HEP with demo, verbal & HO cues. Pt verbalized and return demo understanding.  - Standing Hip Flexion with Resistance (Mirrored)  - 1 x daily - 7 x weekly - 1 sets - 10 reps - Standing Hip Adduction with Resistance (Mirrored)  - 1 x daily - 7 x weekly - 1 sets - 10 reps - Standing Hip Extension with Resistance  - 1 x daily - 7 x weekly - 1 sets - 10 reps - Standing Hip Abduction with Theraband Resistance  - 1 x daily - 7 x weekly - 1 sets - 10 reps - standing knee extension  - 1 x daily - 7 x weekly - 1 sets - 10 reps - 5 seconds hold    HOME EXERCISE PROGRAM: Access Code: [email or web address] URL: [email or web address] Date: 04/21/2023 Prepared by: Vladimir Faster  Exercises - standing calf stretch with forefoot on small step or brick  - 2 x daily - 7 x weekly - 1 sets - 3 reps - 30 seconds hold - Standing Soleus Stretch on Foam 1/2 Roll  - 2 x daily - 7 x weekly - 1 sets - 13 reps - 30 seconds hold - Upright Stance at Door Frame Single Arm  - 1-3 x daily - 7 x weekly - 1 sets - 2 reps - 2 deep breathes hold - Upright Stance at Door Frame with Both Arms  - 1-3 x daily - 7 x weekly - 1 sets - 2 reps - 2 deep breathes hold - Standing posture with back to counter  - 1 x daily - 5 x weekly - 1 sets - 10 reps - 5 seconds hold - Walk with Head Turns  - 1 x daily - 7 x weekly - 1 sets - 10 reps - Walking with Eyes Closed  - 1 x daily - 7 x weekly - 1 sets - 10 reps - Standing Hip Flexion with Resistance (Mirrored)  - 1 x daily - 7 x weekly - 1 sets - 10 reps - Standing Hip Adduction with Resistance (Mirrored)  - 1 x daily - 7 x weekly - 1 sets - 10 reps - Standing Hip Extension with Resistance  - 1 x daily - 7 x weekly - 1  sets - 10 reps - Standing Hip Abduction with Theraband Resistance  - 1 x daily - 7 x weekly - 1 sets - 10 reps - standing knee extension  - 1 x daily - 7 x weekly - 1 sets - 10 reps - 5 seconds hold - Seated Table Hamstring Stretch long sit with strap assist- 1-2 x daily - 7 x weekly - 1 sets - 3 reps - 30 seconds hold - Hip Flexor Stretch at Edge of Bed  - 1-2 x daily - 7 x weekly - 1 sets - 3 reps - 30 seconds hold -  Supine Quadriceps Stretch with Strap on Table  - 1-2 x daily - 7 x weekly - 1 sets - 3 reps - 30 seconds hold  ASSESSMENT: CLINICAL IMPRESSION: Patient appears to understand floor transfers and kneeling recommendations instructed today.  He also appears to understand stretches added to HEP. He is on target to meet LTGs at end of POC. Patient continues to benefit from skilled PT.  OBJECTIVE IMPAIRMENTS: Abnormal gait, decreased activity tolerance, decreased balance, decreased knowledge of condition, decreased knowledge of use of DME, decreased mobility, difficulty walking, decreased strength, increased edema, impaired flexibility, postural dysfunction, prosthetic dependency , and pain.   ACTIVITY LIMITATIONS: carrying, lifting, bending, standing, squatting, stairs, transfers, and locomotion level  PARTICIPATION LIMITATIONS: meal prep, cleaning, driving, community activity, and yard work  PERSONAL FACTORS: Education, Fitness, Past/current experiences, Time since onset of injury/illness/exacerbation, and 3+ comorbidities: see PMH  are also affecting patient's functional outcome.   REHAB POTENTIAL: Good  CLINICAL DECISION MAKING: Evolving/moderate complexity  EVALUATION COMPLEXITY: Moderate   GOALS: Goals reviewed with patient? Yes  SHORT TERM GOALS: Target date: 03/19/2023  Patient donnes prosthesis modified independent & verbalizes proper cleaning. Baseline: SEE OBJECTIVE DATA Goal status:  MET 03/24/2023 2.  Patient tolerates prosthesis >12 hrs total /day without skin  issues or limb pain <5/10 after standing. Baseline: SEE OBJECTIVE DATA Goal status: MET 03/24/2023  3.  Patient able to stand 30 seconds without UE support with supervision. Baseline: SEE OBJECTIVE DATA Goal status: MET 03/24/2023  4. Patient ambulates 99' with RW & prosthesis with supervision. Baseline: SEE OBJECTIVE DATA Goal status: MET 03/24/2023  5. Patient negotiates ramps & curbs with RW & prosthesis with supervision. Baseline: SEE OBJECTIVE DATA Goal status: MET 03/24/2023  LONG TERM GOALS: Target date: 05/14/2023  Patient demonstrates & verbalized understanding of prosthetic care to enable safe utilization of prosthesis. Baseline: SEE OBJECTIVE DATA Goal status: Ongoing 04/14/2023  Patient tolerates prosthesis wear >90% of awake hours without skin or limb pain issues. Baseline: SEE OBJECTIVE DATA Goal status: Ongoing 04/14/2023  Berg Balance >/= 30 / 56 to indicate lower fall risk Baseline: SEE OBJECTIVE DATA Goal status: 04/14/2023  Patient ambulates >500' with prosthesis & LRAD independently Baseline: SEE OBJECTIVE DATA Goal status: 04/14/2023  Patient negotiates ramps, curbs & stairs with single rail with prosthesis & LRAD independently. Baseline: SEE OBJECTIVE DATA Goal status: 04/14/2023   PLAN:  PT FREQUENCY: 2x/week  PT DURATION: 12 weeks  PLANNED INTERVENTIONS: Therapeutic exercises, Therapeutic activity, Neuromuscular re-education, Balance training, Gait training, Patient/Family education, Self Care, Stair training, Prosthetic training, DME instructions, Re-evaluation, and physical performance testing  PLAN FOR NEXT SESSION: check understanding of stretches and floor transfer, continue gait training with cane including ramps, curbs &stairs.  Continue standing balance activities facilitating balance strategies.    Vladimir Faster, PT, DPT 04/21/2023, 8:57 AM

## 2023-04-22 LAB — LIPID PANEL
Cholesterol: 156 mg/dL (ref <200)
HDL: 78 mg/dL (ref >=40)
LDL Cholesterol (Calc): 58 mg/dL (calc)
Non-HDL Cholesterol (Calc): 78 mg/dL (calc) (ref <130)
Total CHOL/HDL Ratio: 2 (calc) (ref <5.0)
Triglycerides: 114 mg/dL (ref <150)

## 2023-04-22 LAB — EXTRA LAV TOP TUBE

## 2023-04-28 ENCOUNTER — Encounter: Payer: Medicare Other | Admitting: Physical Therapy

## 2023-04-30 ENCOUNTER — Encounter: Payer: Self-pay | Admitting: Physical Therapy

## 2023-04-30 ENCOUNTER — Ambulatory Visit (INDEPENDENT_AMBULATORY_CARE_PROVIDER_SITE_OTHER): Payer: Medicare Other | Admitting: Physical Therapy

## 2023-04-30 DIAGNOSIS — M79662 Pain in left lower leg: Secondary | ICD-10-CM | POA: Diagnosis not present

## 2023-04-30 DIAGNOSIS — M6281 Muscle weakness (generalized): Secondary | ICD-10-CM | POA: Diagnosis not present

## 2023-04-30 DIAGNOSIS — R2689 Other abnormalities of gait and mobility: Secondary | ICD-10-CM

## 2023-04-30 DIAGNOSIS — R2681 Unsteadiness on feet: Secondary | ICD-10-CM | POA: Diagnosis not present

## 2023-04-30 NOTE — Therapy (Signed)
OUTPATIENT PHYSICAL THERAPY PROSTHETICS TREATMENT   Patient Name: [name] MRN: [id number] DOB:Feb 18, 1966, 57 y.o., male Today's Date: 04/30/2023  PCP: [name], MD REFERRING PROVIDER: Marcello Fennel, MD  END OF SESSION:  PT End of Session - 04/30/23 1009     Visit Number 15    Number of Visits 24    Date for PT Re-Evaluation 05/14/23    Authorization Type Medicare & BCBS    Progress Note Due on Visit 20    PT Start Time 1015    PT Stop Time 1059    PT Time Calculation (min) 44 min    Equipment Utilized During Treatment Gait belt    Activity Tolerance Patient tolerated treatment well;Patient limited by pain    Behavior During Therapy [facility] for tasks assessed/performed                   Past Medical History:  Diagnosis Date   Acute osteomyelitis of right foot (HCC) 05/15/2019   Anemia 06/23/2019   Aortic stenosis    Bursitis    right shoulder   Complication of anesthesia    Coronary artery disease    Diabetes with renal manifestations(250.4) 08/23/2013   Diabetic infection of right foot (HCC)    Diabetic retinopathy associated with type 2 diabetes mellitus (HCC) 06/23/2019   ESRD on hemodialysis (HCC) 07/18/2011   Essential hypertension 10/18/2007   Qualifier: Diagnosis of  By: Everardo All MD, Gregary Signs A    History of blood transfusion    HYPERLIPIDEMIA 05/16/2008   Qualifier: Diagnosis of  By: Jonny Ruiz MD, Len Blalock    MRSA INFECTION 10/18/2007   Qualifier: Diagnosis of  By: Tyrone Apple, Lucy     Other forms of retinal detachment(361.89) 06/23/2019   PAD (peripheral artery disease) (HCC)    PONV (postoperative nausea and vomiting)    Prolonged QT interval    Pulmonary hypertension (HCC)    Secondary hyperparathyroidism (HCC) 06/23/2019   Sepsis (HCC) 05/14/2019   Past Surgical History:  Procedure Laterality Date   ABDOMINAL AORTOGRAM N/A 05/17/2019   Procedure: ABDOMINAL AORTOGRAM;  Surgeon: Sherren Kerns, MD;  Location: [facility] INVASIVE CV LAB;  Service:  Cardiovascular;  Laterality: N/A;   AMPUTATION TOE Right 05/14/2019   Procedure: PARTIAL AMPUTATION SECOND TOE RIGHT FOOT;  Surgeon: Park Liter, DPM;  Location: [facility] OR;  Service: Podiatry;  Laterality: Right;   AMPUTATION TOE Right 05/23/2019   Procedure: AMPUTATION OF SECOND TOE METATARSAL PHALANGEAL JOINT;  Surgeon: Park Liter, DPM;  Location: [facility] OR;  Service: Podiatry;  Laterality: Right;   AMPUTATION TOE Right 05/27/2019   Procedure: Amputation Toe;  Surgeon: Park Liter, DPM;  Location: [facility] OR;  Service: Podiatry;  Laterality: Right;  right third toe   APPLICATION OF WOUND VAC Right 05/27/2019   Procedure: Application Of Wound Vac;  Surgeon: Park Liter, DPM;  Location: [facility] OR;  Service: Podiatry;  Laterality: Right;   AV FISTULA PLACEMENT  06-18-11   Right brachiocephalic AVF   BONE BIOPSY Right 05/14/2019   Procedure: OPEN SUPERFICIAL BONE BIOPSY GREAT TOE;  Surgeon: Park Liter, DPM;  Location: [facility] OR;  Service: Podiatry;  Laterality: Right;   CARDIAC CATHETERIZATION     CORONARY BALLOON ANGIOPLASTY N/A 09/24/2020   Procedure: CORONARY BALLOON ANGIOPLASTY;  Surgeon: Swaziland, Peter M, MD;  Location: [facility] INVASIVE CV LAB;  Service: Cardiovascular;  Laterality: N/A;   CORONARY PRESSURE/FFR STUDY N/A 09/24/2020   Procedure: INTRAVASCULAR PRESSURE WIRE/FFR STUDY;  Surgeon: Swaziland,  Demetria Pore, MD;  Location: [facility] CV LAB;  Service: Cardiovascular;  Laterality: N/A;   CORONARY STENT INTERVENTION N/A 09/24/2020   Procedure: CORONARY STENT INTERVENTION;  Surgeon: Swaziland, Peter M, MD;  Location: [facility] INVASIVE CV LAB;  Service: Cardiovascular;  Laterality: N/A;   EYE SURGERY     left eye for Laser, diabetic retinopathy   FISTULOGRAM Right 11/06/2011   Procedure: FISTULOGRAM;  Surgeon: Fransisco Hertz, MD;  Location: [facility] CATH LAB;  Service: Cardiovascular;  Laterality: Right;   HEMATOMA EVACUATION Right Feb. 25, 2014   HEMATOMA EVACUATION Right 12/14/2012   Procedure: EVACUATION HEMATOMA;   Surgeon: Fransisco Hertz, MD;  Location: [facility] OR;  Service: Vascular;  Laterality: Right;   INCISION AND DRAINAGE Right 05/23/2019   Procedure: INCISION AND DRAINAGE OF RIGHT FOOT DEEP SPACE ABSCESS;  Surgeon: Park Liter, DPM;  Location: [facility] OR;  Service: Podiatry;  Laterality: Right;   INJECTION OF SILICONE OIL Left 01/09/2023   Procedure: INJECTION OF SILICONE OIL;  Surgeon: Edmon Crape, MD;  Location: [facility] OR;  Service: Ophthalmology;  Laterality: Left;   INSERTION OF DIALYSIS CATHETER  12/14/2012   Procedure: INSERTION OF DIALYSIS CATHETER;  Surgeon: Fransisco Hertz, MD;  Location: [facility] OR;  Service: Vascular;;   INSERTION OF DIALYSIS CATHETER Right 01/07/2021   Procedure: INSERTION OF DIALYSIS CATHETER;  Surgeon: Sherren Kerns, MD;  Location: [facility] OR;  Service: Vascular;  Laterality: Right;   INSERTION OF DIALYSIS CATHETER Left 01/08/2021   Procedure: INSERTION OF LEFT INTERNAL JUGULAR DIALYSIS CATHETER, tunneled;  Surgeon: Chuck Hint, MD;  Location: [facility] OR;  Service: Vascular;  Laterality: Left;   IRRIGATION AND DEBRIDEMENT FOOT Right 05/25/2019   Procedure: INCISION AND DRAINAGE FOOT;  Surgeon: Park Liter, DPM;  [facility]: [facility] OR;  Service: Podiatry;  Laterality: Right;   IRRIGATION AND DEBRIDEMENT FOOT Right 05/27/2019   Procedure: IRRIGATION AND DEBRIDEMENT FOOT;  Surgeon: Park Liter, DPM;  Location: [facility] OR;  Service: Podiatry;  Laterality: Right;   LASER PHOTO ABLATION Left 01/09/2023   Procedure: LASER PHOTO ABLATION;  Surgeon: Edmon Crape, MD;  Location: [facility] OR;  Service: Ophthalmology;  Laterality: Left;   LEFT HEART CATH AND CORONARY ANGIOGRAPHY N/A 09/24/2020   Procedure: LEFT HEART CATH AND CORONARY ANGIOGRAPHY;  Surgeon: Swaziland, Peter M, MD;  Location: [facility] INVASIVE CV LAB;  Service: Cardiovascular;  Laterality: N/A;   LOWER EXTREMITY ANGIOGRAPHY Bilateral 05/17/2019   Procedure: LOWER EXTREMITY ANGIOGRAPHY;  Surgeon: Sherren Kerns, MD;  [facility]: [facility] INVAS[facility] CV LAB;   Service: Cardiovascular;  Laterality: Bilateral;   PARS PLANA VITRECTOMY Left 01/09/2023   Procedure: PARS PLANA VITRECTOMY WITH 25 GAUGE AND REPAIR OF RETINAL DETACHMENT;  Surgeon: Edmon Crape, MD;  Location: [facility] OR;  Service: Ophthalmology;  Laterality: Left;   REMOVAL OF A DIALYSIS CATHETER Right 01/08/2021   Procedure: REMOVAL OF TEMPORARY DIALYSIS CATHETER, LEFT FEMORAL VEIN;  Surgeon: Chuck Hint, MD;  Location: [facility] OR;  Service: Vascular;  Laterality: Right;   REVISON OF ARTERIOVENOUS FISTULA Right 12/14/2012   Procedure: REVISON OF upper arm ARTERIOVENOUS FISTULA using 63mmx10cm gortex graft;  Surgeon: Fransisco Hertz, MD;  Location: [facility] OR;  Service: Vascular;  Laterality: Right;   REVISON OF ARTERIOVENOUS FISTULA Right 12/27/2020   Procedure: RIGHT UPPER EXTREMITY ARTERIOVENOUS FISTULA REVISiON;  Surgeon: Leonie Douglas, MD;  Location: [facility] OR;  Service: Vascular;  Laterality: Right;  PERIPHERAL NERVE BLOCK   RIGHT/LEFT HEART CATH AND CORONARY ANGIOGRAPHY N/A 08/07/2020  Procedure: RIGHT/LEFT HEART CATH AND CORONARY ANGIOGRAPHY;  Surgeon: Swaziland, Peter M, MD;  Location: [facility] INVASIVE CV LAB;  Service: Cardiovascular;  Laterality: N/A;   SOFT TISSUE MASS EXCISION     Right arm, Left leg  for MRSA infection   Patient Active Problem List   Diagnosis Date Noted   ACS (acute coronary syndrome) (HCC) 09/12/2021   Prolonged QT interval 09/12/2021   Unstable angina (HCC) 08/20/2021   Nuclear sclerotic cataract of right eye 06/11/2021   Nuclear sclerotic cataract of left eye 06/11/2021   Posterior subcapsular age-related cataract, left eye 06/11/2021   Stable treated proliferative diabetic retinopathy of left eye determined by examination associated with type 2 diabetes mellitus (HCC) 06/11/2021   Right epiretinal membrane 06/11/2021   Stable treated proliferative diabetic retinopathy of right eye determined by examination associated with type 2 diabetes mellitus (HCC) 06/11/2021    Hyperkalemia 01/09/2021   ESRD (end stage renal disease) (HCC)    Dialysis AV fistula malfunction, initial encounter (HCC) 01/07/2021   NSTEMI (non-ST elevated myocardial infarction) (HCC) 09/21/2020   3-vessel coronary artery disease 08/09/2020   Pulmonary hypertension, unspecified (HCC) 08/09/2020   Pancytopenia (HCC) 08/06/2020   Allergy, unspecified, subsequent encounter 06/28/2020   Anaphylactic shock, unspecified, subsequent encounter 06/28/2020   Lymphedema of both lower extremities 12/29/2019   Hyperphosphatemia 12/11/2019   Encounter for post surgical wound check 08/16/2019   Post-op pain 08/16/2019   S/P transmetatarsal amputation of foot, right (HCC) 08/16/2019   Unspecified protein-calorie malnutrition (HCC) 07/12/2019   PAD (peripheral artery disease) (HCC) 06/30/2019   Diabetic retinopathy associated with type 2 diabetes mellitus (HCC) 06/23/2019   Other forms of retinal detachment(361.89) 06/23/2019   Secondary hyperparathyroidism (HCC) 06/23/2019   Osteomyelitis, unspecified (HCC) 05/31/2019   Gangrene of toe of right foot (HCC)    Ulcer of right foot, with necrosis of bone (HCC)    Abscess of right foot    Diabetic infection of right foot (HCC)    Ulcer of right second toe with necrosis of bone (HCC)    Cellulitis of right foot 05/22/2019   Acute osteomyelitis of right foot (HCC) 05/15/2019   Osteomyelitis of great toe of right foot (HCC) 05/14/2019   Diabetic foot ulcer- right second toe 05/14/2019   Sepsis (HCC) 05/14/2019   Hypothyroidism, unspecified 01/11/2018   Hypercalcemia 06/14/2016   Dyslipidemia 05/11/2015   Hyperglycemia 05/05/2015   Diarrhea, unspecified 02/01/2015   Fever, unspecified 02/01/2015   Pain, unspecified 02/01/2015   Pruritus, unspecified 02/01/2015   Coagulation defect, unspecified (HCC) 07/07/2014   Encounter for removal of sutures 05/30/2014   Encounter for immunization 04/19/2014   Chest pain 08/23/2013   Type 2 diabetes mellitus  (HCC) 08/23/2013   Hemodialysis status (HCC) 08/23/2013   Chronic kidney disease, stage V (HCC) 08/23/2013   Obesity, unspecified 08/23/2013   Complication of vascular dialysis catheter 12/15/2012   Other fluid overload 12/07/2012   Other specified complication of vascular prosthetic devices, implants and grafts, subsequent encounter 08/06/2012   ESRD on dialysis (HCC) 07/18/2011   Pure hypercholesterolemia 06/23/2011   Iron deficiency anemia, unspecified 06/21/2011   HYPOGLYCEMIA, UNSPECIFIED 11/13/2008   RENAL INSUFFICIENCY 07/31/2008   FOOT ULCER, LEFT 06/29/2008   HYPERLIPIDEMIA 05/16/2008   MRSA INFECTION 10/18/2007   Essential hypertension 10/18/2007    ONSET DATE: 02/12/2023 prosthesis delivery  REFERRING DIAG: 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 He relays some difficulty ambulating over uneven surfaces when he went to restaurant. He says he understands floor transfers and did not feel PT needed to work on that today with him.   PERTINENT HISTORY: left BKA, right TMA, DM2, PAD, CAD, MI, ESRD on HD, moderate AS, HTN, hypothyroidism, anemia, CHF  PAIN:  Are you having pain?   Yes: NPRS scale: this morning 0/10 Pain location: low back, muscles of both LEs Pain description: dull ache, sciatica pain Aggravating factors: walking with lean,  Relieving factors: exercises  Residual limb pain 0/10 mainly at distal tibia  PRECAUTIONS: Fall and Other: No BP RUE  WEIGHT BEARING RESTRICTIONS: No  FALLS: Has patient fallen in last 6 months?  No  LIVING ENVIRONMENT: Lives with: lives with their spouse Lives in: House Home Access: Ramped entrance Home layout: Two level, 1/2 bath on main level, and stair lift Stairs: Yes: Internal: 15 steps; on left going up and External: 2-5 steps; can reach both Has following equipment at home: Environmental consultant - 2 wheeled, Wheelchair (manual), [employer], Grab bars, and Ramped entry  PLOF: Independent with household mobility without device and Independent with community mobility without device  PATIENT GOALS:   to use prosthesis to be active including gardening, his yard has hill,   OBJECTIVE:  COGNITION: Overall cognitive status: Within functional limits for tasks assessed   SENSATION: WFL  POSTURE:  Eval / 02/17/2023:  rounded shoulders, forward head, flexed trunk , and weight shift right  LOWER EXTREMITY ROM:  ROM P:passive  A:active Right eval Left eval  Hip flexion    Hip extension Standing -15* Standing  -15*  Hip abduction    Hip adduction    Hip internal rotation    Hip external rotation    Knee flexion    Knee extension    Ankle dorsiflexion    Ankle plantarflexion    Ankle inversion    Ankle eversion     (Blank rows = not tested)  LOWER EXTREMITY MMT:  MMT Right eval Left eval  Hip flexion    Hip extension Functional 4/5 Functional 4/5  Hip abduction Functional 4/5 Functional 4/5  Hip adduction    Hip internal rotation    Hip external rotation    Knee flexion Functional 4/5 Functional 4/5  Knee extension Functional 4/5 Functional 4/5  Ankle dorsiflexion 3-/5 NA  Ankle plantarflexion  NA  Ankle inversion  NA  Ankle eversion  NA  (Blank rows = not tested)  TRANSFERS: Eval / 02/17/2023: Sit to stand: SBA requires use of armrests from 18" chair using back of legs against chair & RW to stabilize Stand to sit: SBA requires use of armrests from 18" chair using back of legs against chair & RW to stabilize  GAIT: 03/26/2023: with cane Gait Velocity 2.13  ft/sec comfortable self-selected pace with SBA & 2.60 ft/sec fast pace with CGA Functional Gait Assessment with cane:    Eval / 02/17/2023: Gait pattern: step to pattern, decreased step length- Right, decreased stance time- Left, decreased hip/knee flexion- Left, decreased ankle dorsiflexion- Right, Left hip hike, Right foot flat, genu recurvatum- Left, antalgic, trendelenburg, lateral hip instability, trunk flexed, abducted- Left, and poor foot clearance- Right excessive BUE weight bearing  on RW Distance walked: 100' Assistive device utilized: Environmental consultant - 2 wheeled and TTA prosthesis Level of assistance: SBA  FUNCTIONAL TESTs:  Eval / 4/30/2024Sharlene Motts Balance Scale: 11/56   CURRENT PROSTHETIC WEAR ASSESSMENT: 03/25/2023: pt reports wearing prosthesis all awake hours including to dialysis with some distal tibia pain.  He verbalizes how to adjust ply socks, properly donnes and reports proper cleaning.   Eval /  02/17/2023: Patient is dependent with: skin check, residual limb care, care of non-amputated limb, prosthetic cleaning, ply sock cleaning, correct ply sock adjustment, proper wear schedule/adjustment, and proper weight-bearing schedule/adjustment Donning prosthesis: SBA Doffing prosthesis: SBA Prosthetic wear tolerance: 4 hours, 1x/day, 4 of 6 days since delivery Prosthetic weight bearing tolerance: 5 minutes with limb pain 8/10 Edema: pitting with 5 sec capillary refill Residual limb condition: cylindrical shape, no open areas, limited to no hair growth, scar dryness, normal color & temperature Prosthetic description: silicon liner with pin lock suspension, total contact socket with flexible inner socket, dynamic response foot K code/activity level with prosthetic use: Level 3    TODAY'S TREATMENT:                                                                                                                             DATE:  04/30/2023: Prosthetic Training with Transtibial  Amputation:  Pt amb 150' with cane with PT Pt ambulated over obstacle course with cane and CGA/min A for unsteady surface simulation including stepping onto and down from airex pad, walking on yoga mat that had ankle weights underneath, and stepping over black bloster. Performed 4 times total with seated rest break in between Sidestepping on foam beam in bars with with intermittent UE support 3 round trips Tandem walking on foam beam with one UE support in bars 3 round trips  Therapeutic Exercise: Leg press machine DL 40# [id number], then left leg only 25# X 15, Rt leg only 50# X 15  TODAY'S TREATMENT:                                                                                                                             DATE:  04/21/2023: Prosthetic Training with Transtibial Amputation: PT demo & verbal cues on Correcting rotation of socket on residual limb. Pt verbalized understanding.  PT demo & verbal cues on Floor transfer pushing on chair seat via half kneeling on RLE. If he falls, first check if  injured then check that socket did not rotate on limb prior to getting up.  Pt return demo understanding and verbalized understanding including recommendation for 3 reps daily to build strength & memory for technique.  PT demo & verbal cues for Kneeling using towel roll or folded pillow under right ankle to prevent prolonged pressure on TMA distal limb. Pt verbalized understanding.  Pt amb 150' with cane with PT noting increased flexion of left knee & hip in stance. PT educated in stretches below to address issue.  Therapeutic Exercise: PT demo, verbal & HO cues for stretches to hamstring, hip flexors & quad muscles. See HEP below. Pt return demo 2 reps and verbalized understanding including rationale.     04/16/2023: Prosthetic Training with Transtibial Amputation: Pt amb 300' X 2 with cane stand alone tip with supervision working on scanning maintaining path/pace, increasing pace and eyes closed  to simulate the dark.  He improved from last time worked on these skills but still has slight change in path and pace more than normal.  Pt neg ramp with cane with SBA. PT demo & verbal cues on weight shift over prosthesis in stance with shorter step and maintaining momentum.  Pt neg curb with cane with SBA with verbal cues on technique to maintain momentum.   Therapeutic Exercise: Pt reports no issues with LE theraband kicks. PT demo & verbal cues on step up & step down forward facing with BLEs on 4" step with counter & cane support. Pt performed 5 reps ea LE with improved prosthetic LE knee control.  PT demo & verbal cues on step up & step down side stepping both to right & left with BLEs on 4" step with counter & cane support. Pt performed 5 reps ea LE with improved prosthetic LE knee control.    04/14/2023: Prosthetic Training with Transtibial Amputation: Pt amb 100' X 2 with cane with verbal cues for upright posture. Pt neg stairs 11 with left rail & cane RUE descend with step-to alternating lead LE and ascend alternating pattern.  PT reviewed shoe recommendations. Pt verbalized understanding.   Therapeutic Exercise  Standing with cane & chair back support with upright posture. PT updated HEP with demo, verbal & HO cues. Pt verbalized and return demo understanding.  - Standing Hip Flexion with Resistance (Mirrored)  - 1 x daily - 7 x weekly - 1 sets - 10 reps - Standing Hip Adduction with Resistance (Mirrored)  - 1 x daily - 7 x weekly - 1 sets - 10 reps - Standing Hip Extension with Resistance  - 1 x daily - 7 x weekly - 1 sets - 10 reps - Standing Hip Abduction with Theraband Resistance  - 1 x daily - 7 x weekly - 1 sets - 10 reps - standing knee extension  - 1 x daily - 7 x weekly - 1 sets - 10 reps - 5 seconds hold    HOME EXERCISE PROGRAM: Access Code: [email or web address] URL: [email or web address] Date: 04/21/2023 Prepared by: Vladimir Faster  Exercises - standing calf  stretch with forefoot on small step or brick  - 2 x daily - 7 x weekly - 1 sets - 3 reps - 30 seconds hold - Standing Soleus Stretch on Foam 1/2 Roll  - 2 x daily - 7 x weekly - 1 sets - 13 reps - 30 seconds hold - Upright Stance at Door Frame Single Arm  - 1-3 x daily - 7 x weekly - 1 sets - 2 reps -  2 deep breathes hold - Upright Stance at Door Frame with Both Arms  - 1-3 x daily - 7 x weekly - 1 sets - 2 reps - 2 deep breathes hold - Standing posture with back to counter  - 1 x daily - 5 x weekly - 1 sets - 10 reps - 5 seconds hold - Walk with Head Turns  - 1 x daily - 7 x weekly - 1 sets - 10 reps - Walking with Eyes Closed  - 1 x daily - 7 x weekly - 1 sets - 10 reps - Standing Hip Flexion with Resistance (Mirrored)  - 1 x daily - 7 x weekly - 1 sets - 10 reps - Standing Hip Adduction with Resistance (Mirrored)  - 1 x daily - 7 x weekly - 1 sets - 10 reps - Standing Hip Extension with Resistance  - 1 x daily - 7 x weekly - 1 sets - 10 reps - Standing Hip Abduction with Theraband Resistance  - 1 x daily - 7 x weekly - 1 sets - 10 reps - standing knee extension  - 1 x daily - 7 x weekly - 1 sets - 10 reps - 5 seconds hold - Seated Table Hamstring Stretch long sit with strap assist- 1-2 x daily - 7 x weekly - 1 sets - 3 reps - 30 seconds hold - Hip Flexor Stretch at Edge of Bed  - 1-2 x daily - 7 x weekly - 1 sets - 3 reps - 30 seconds hold - Supine Quadriceps Stretch with Strap on Table  - 1-2 x daily - 7 x weekly - 1 sets - 3 reps - 30 seconds hold  ASSESSMENT: CLINICAL IMPRESSION: Session focused on gait involving balance on uneven surfaces. He is overall supervision to min A with this. He will continue to benefit from skilled PT.  OBJECTIVE IMPAIRMENTS: Abnormal gait, decreased activity tolerance, decreased balance, decreased knowledge of condition, decreased knowledge of use of DME, decreased mobility, difficulty walking, decreased strength, increased edema, impaired flexibility, postural  dysfunction, prosthetic dependency , and pain.   ACTIVITY LIMITATIONS: carrying, lifting, bending, standing, squatting, stairs, transfers, and locomotion level  PARTICIPATION LIMITATIONS: meal prep, cleaning, driving, community activity, and yard work  PERSONAL FACTORS: Education, Fitness, Past/current experiences, Time since onset of injury/illness/exacerbation, and 3+ comorbidities: see PMH  are also affecting patient's functional outcome.   REHAB POTENTIAL: Good  CLINICAL DECISION MAKING: Evolving/moderate complexity  EVALUATION COMPLEXITY: Moderate   GOALS: Goals reviewed with patient? Yes  SHORT TERM GOALS: Target date: 03/19/2023  Patient donnes prosthesis modified independent & verbalizes proper cleaning. Baseline: SEE OBJECTIVE DATA Goal status:  MET 03/24/2023 2.  Patient tolerates prosthesis >12 hrs total /day without skin issues or limb pain <5/10 after standing. Baseline: SEE OBJECTIVE DATA Goal status: MET 03/24/2023  3.  Patient able to stand 30 seconds without UE support with supervision. Baseline: SEE OBJECTIVE DATA Goal status: MET 03/24/2023  4. Patient ambulates 32' with RW & prosthesis with supervision. Baseline: SEE OBJECTIVE DATA Goal status: MET 03/24/2023  5. Patient negotiates ramps & curbs with RW & prosthesis with supervision. Baseline: SEE OBJECTIVE DATA Goal status: MET 03/24/2023  LONG TERM GOALS: Target date: 05/14/2023  Patient demonstrates & verbalized understanding of prosthetic care to enable safe utilization of prosthesis. Baseline: SEE OBJECTIVE DATA Goal status: Ongoing 04/14/2023  Patient tolerates prosthesis wear >90% of awake hours without skin or limb pain issues. Baseline: SEE OBJECTIVE DATA Goal status: Ongoing 04/14/2023  Sharlene Motts  Balance >/= 30 / 56 to indicate lower fall risk Baseline: SEE OBJECTIVE DATA Goal status: 04/14/2023  Patient ambulates >500' with prosthesis & LRAD independently Baseline: SEE OBJECTIVE DATA Goal status:  04/14/2023  Patient negotiates ramps, curbs & stairs with single rail with prosthesis & LRAD independently. Baseline: SEE OBJECTIVE DATA Goal status: 04/14/2023   PLAN:  PT FREQUENCY: 2x/week  PT DURATION: 12 weeks  PLANNED INTERVENTIONS: Therapeutic exercises, Therapeutic activity, Neuromuscular re-education, Balance training, Gait training, Patient/Family education, Self Care, Stair training, Prosthetic training, DME instructions, Re-evaluation, and physical performance testing  PLAN FOR NEXT SESSION: check understanding of stretches  continue gait training with cane including uneven surfaces or different terrains, ramps, curbs &stairs.  Continue standing balance activities facilitating balance strategies.    April Manson, PT, DPT 04/30/2023, 11:37 AM

## 2023-05-05 ENCOUNTER — Encounter: Payer: Self-pay | Admitting: Physical Therapy

## 2023-05-05 ENCOUNTER — Ambulatory Visit: Payer: Medicare Other | Admitting: Physical Therapy

## 2023-05-05 ENCOUNTER — Encounter: Payer: Medicare Other | Admitting: Physical Therapy

## 2023-05-05 DIAGNOSIS — R2689 Other abnormalities of gait and mobility: Secondary | ICD-10-CM | POA: Diagnosis not present

## 2023-05-05 DIAGNOSIS — R2681 Unsteadiness on feet: Secondary | ICD-10-CM

## 2023-05-05 DIAGNOSIS — M79662 Pain in left lower leg: Secondary | ICD-10-CM

## 2023-05-05 DIAGNOSIS — M6281 Muscle weakness (generalized): Secondary | ICD-10-CM | POA: Diagnosis not present

## 2023-05-05 NOTE — Therapy (Signed)
OUTPATIENT PHYSICAL THERAPY PROSTHETICS TREATMENT   Patient Name: [name] MRN: [id number] DOB:1966-01-06, 57 y.o., male Today's Date: 05/05/2023  PCP: [name], MD REFERRING PROVIDER: Marcello Fennel, MD  END OF SESSION:  PT End of Session - 05/05/23 1432     Visit Number 16    Number of Visits 24    Date for PT Re-Evaluation 05/14/23    Authorization Type Medicare & BCBS    Progress Note Due on Visit 20    PT Start Time 1431    PT Stop Time 1510    PT Time Calculation (min) 39 min    Equipment Utilized During Treatment Gait belt    Activity Tolerance Patient tolerated treatment well;Patient limited by pain    Behavior During Therapy [facility] for tasks assessed/performed                    Past Medical History:  Diagnosis Date   Acute osteomyelitis of right foot (HCC) 05/15/2019   Anemia 06/23/2019   Aortic stenosis    Bursitis    right shoulder   Complication of anesthesia    Coronary artery disease    Diabetes with renal manifestations(250.4) 08/23/2013   Diabetic infection of right foot (HCC)    Diabetic retinopathy associated with type 2 diabetes mellitus (HCC) 06/23/2019   ESRD on hemodialysis (HCC) 07/18/2011   Essential hypertension 10/18/2007   Qualifier: Diagnosis of  By: Everardo All MD, Gregary Signs A    History of blood transfusion    HYPERLIPIDEMIA 05/16/2008   Qualifier: Diagnosis of  By: Jonny Ruiz MD, Len Blalock    MRSA INFECTION 10/18/2007   Qualifier: Diagnosis of  By: Tyrone Apple, Lucy     Other forms of retinal detachment(361.89) 06/23/2019   PAD (peripheral artery disease) (HCC)    PONV (postoperative nausea and vomiting)    Prolonged QT interval    Pulmonary hypertension (HCC)    Secondary hyperparathyroidism (HCC) 06/23/2019   Sepsis (HCC) 05/14/2019   Past Surgical History:  Procedure Laterality Date   ABDOMINAL AORTOGRAM N/A 05/17/2019   Procedure: ABDOMINAL AORTOGRAM;  Surgeon: Sherren Kerns, MD;  Location: [facility] INVASIVE CV LAB;   Service: Cardiovascular;  Laterality: N/A;   AMPUTATION TOE Right 05/14/2019   Procedure: PARTIAL AMPUTATION SECOND TOE RIGHT FOOT;  Surgeon: Park Liter, DPM;  Location: [facility] OR;  Service: Podiatry;  Laterality: Right;   AMPUTATION TOE Right 05/23/2019   Procedure: AMPUTATION OF SECOND TOE METATARSAL PHALANGEAL JOINT;  Surgeon: Park Liter, DPM;  Location: [facility] OR;  Service: Podiatry;  Laterality: Right;   AMPUTATION TOE Right 05/27/2019   Procedure: Amputation Toe;  Surgeon: Park Liter, DPM;  Location: [facility] OR;  Service: Podiatry;  Laterality: Right;  right third toe   APPLICATION OF WOUND VAC Right 05/27/2019   Procedure: Application Of Wound Vac;  Surgeon: Park Liter, DPM;  Location: [facility] OR;  Service: Podiatry;  Laterality: Right;   AV FISTULA PLACEMENT  06-18-11   Right brachiocephalic AVF   BONE BIOPSY Right 05/14/2019   Procedure: OPEN SUPERFICIAL BONE BIOPSY GREAT TOE;  Surgeon: Park Liter, DPM;  Location: [facility] OR;  Service: Podiatry;  Laterality: Right;   CARDIAC CATHETERIZATION     CORONARY BALLOON ANGIOPLASTY N/A 09/24/2020   Procedure: CORONARY BALLOON ANGIOPLASTY;  Surgeon: Swaziland, Peter M, MD;  Location: [facility] INVASIVE CV LAB;  Service: Cardiovascular;  Laterality: N/A;   CORONARY PRESSURE/FFR STUDY N/A 09/24/2020   Procedure: INTRAVASCULAR PRESSURE WIRE/FFR STUDY;  Surgeon:  Swaziland, Peter M, MD;  Location: [facility] INVASIVE CV LAB;  Service: Cardiovascular;  Laterality: N/A;   CORONARY STENT INTERVENTION N/A 09/24/2020   Procedure: CORONARY STENT INTERVENTION;  Surgeon: Swaziland, Peter M, MD;  Location: [facility] INVASIVE CV LAB;  Service: Cardiovascular;  Laterality: N/A;   EYE SURGERY     left eye for Laser, diabetic retinopathy   FISTULOGRAM Right 11/06/2011   Procedure: FISTULOGRAM;  Surgeon: Fransisco Hertz, MD;  Location: [facility] CATH LAB;  Service: Cardiovascular;  Laterality: Right;   HEMATOMA EVACUATION Right Feb. 25, 2014   HEMATOMA EVACUATION Right 12/14/2012   Procedure: EVACUATION  HEMATOMA;  Surgeon: Fransisco Hertz, MD;  Location: [facility] OR;  Service: Vascular;  Laterality: Right;   INCISION AND DRAINAGE Right 05/23/2019   Procedure: INCISION AND DRAINAGE OF RIGHT FOOT DEEP SPACE ABSCESS;  Surgeon: Park Liter, DPM;  Location: [facility] OR;  Service: Podiatry;  Laterality: Right;   INJECTION OF SILICONE OIL Left 01/09/2023   Procedure: INJECTION OF SILICONE OIL;  Surgeon: Edmon Crape, MD;  Location: [facility] OR;  Service: Ophthalmology;  Laterality: Left;   INSERTION OF DIALYSIS CATHETER  12/14/2012   Procedure: INSERTION OF DIALYSIS CATHETER;  Surgeon: Fransisco Hertz, MD;  Location: [facility] OR;  Service: Vascular;;   INSERTION OF DIALYSIS CATHETER Right 01/07/2021   Procedure: INSERTION OF DIALYSIS CATHETER;  Surgeon: Sherren Kerns, MD;  Location: [facility] OR;  Service: Vascular;  Laterality: Right;   INSERTION OF DIALYSIS CATHETER Left 01/08/2021   Procedure: INSERTION OF LEFT INTERNAL JUGULAR DIALYSIS CATHETER, tunneled;  Surgeon: Chuck Hint, MD;  Location: [facility] OR;  Service: Vascular;  Laterality: Left;   IRRIGATION AND DEBRIDEMENT FOOT Right 05/25/2019   Procedure: INCISION AND DRAINAGE FOOT;  Surgeon: Park Liter, DPM;  Location: [facility] OR;  Service: Podiatry;  Laterality: Right;   IRRIGATION AND DEBRIDEMENT FOOT Right 05/27/2019   Procedure: IRRIGATION AND DEBRIDEMENT FOOT;  Surgeon: Park Liter, DPM;  Location: [facility] OR;  Service: Podiatry;  Laterality: Right;   LASER PHOTO ABLATION Left 01/09/2023   Procedure: LASER PHOTO ABLATION;  Surgeon: Edmon Crape, MD;  Location: [facility] OR;  Service: Ophthalmology;  Laterality: Left;   LEFT HEART CATH AND CORONARY ANGIOGRAPHY N/A 09/24/2020   Procedure: LEFT HEART CATH AND CORONARY ANGIOGRAPHY;  Surgeon: Swaziland, Peter M, MD;  Location: [facility] INVASIVE CV LAB;  Service: Cardiovascular;  Laterality: N/A;   LOWER EXTREMITY ANGIOGRAPHY Bilateral 05/17/2019   Procedure: LOWER EXTREMITY ANGIOGRAPHY;  Surgeon: Sherren Kerns, MD;  [facility]: [facility] INVASIVE  CV LAB;  Service: Cardiovascular;  Laterality: Bilateral;   PARS PLANA VITRECTOMY Left 01/09/2023   Procedure: PARS PLANA VITRECTOMY WITH 25 GAUGE AND REPAIR OF RETINAL DETACHMENT;  Surgeon: Edmon Crape, MD;  Location: [facility] OR;  Service: Ophthalmology;  Laterality: Left;   REMOVAL OF A DIALYSIS CATHETER Right 01/08/2021   Procedure: REMOVAL OF TEMPORARY DIALYSIS CATHETER, LEFT FEMORAL VEIN;  Surgeon: Chuck Hint, MD;  Location: [facility] OR;  Service: Vascular;  Laterality: Right;   REVISON OF ARTERIOVENOUS FISTULA Right 12/14/2012   Procedure: REVISON OF upper arm ARTERIOVENOUS FISTULA using 36mmx10cm gortex graft;  Surgeon: Fransisco Hertz, MD;  Location: [facility] OR;  Service: Vascular;  Laterality: Right;   REVISON OF ARTERIOVENOUS FISTULA Right 12/27/2020   Procedure: RIGHT UPPER EXTREMITY ARTERIOVENOUS FISTULA REVISiON;  Surgeon: Leonie Douglas, MD;  Location: [facility] OR;  Service: Vascular;  Laterality: Right;  PERIPHERAL NERVE BLOCK   RIGHT/LEFT HEART CATH AND CORONARY ANGIOGRAPHY N/A 08/07/2020  Procedure: RIGHT/LEFT HEART CATH AND CORONARY ANGIOGRAPHY;  Surgeon: Swaziland, Peter M, MD;  Location: [facility] INVASIVE CV LAB;  Service: Cardiovascular;  Laterality: N/A;   SOFT TISSUE MASS EXCISION     Right arm, Left leg  for MRSA infection   Patient Active Problem List   Diagnosis Date Noted   ACS (acute coronary syndrome) (HCC) 09/12/2021   Prolonged QT interval 09/12/2021   Unstable angina (HCC) 08/20/2021   Nuclear sclerotic cataract of right eye 06/11/2021   Nuclear sclerotic cataract of left eye 06/11/2021   Posterior subcapsular age-related cataract, left eye 06/11/2021   Stable treated proliferative diabetic retinopathy of left eye determined by examination associated with type 2 diabetes mellitus (HCC) 06/11/2021   Right epiretinal membrane 06/11/2021   Stable treated proliferative diabetic retinopathy of right eye determined by examination associated with type 2 diabetes mellitus (HCC) 06/11/2021    Hyperkalemia 01/09/2021   ESRD (end stage renal disease) (HCC)    Dialysis AV fistula malfunction, initial encounter (HCC) 01/07/2021   NSTEMI (non-ST elevated myocardial infarction) (HCC) 09/21/2020   3-vessel coronary artery disease 08/09/2020   Pulmonary hypertension, unspecified (HCC) 08/09/2020   Pancytopenia (HCC) 08/06/2020   Allergy, unspecified, subsequent encounter 06/28/2020   Anaphylactic shock, unspecified, subsequent encounter 06/28/2020   Lymphedema of both lower extremities 12/29/2019   Hyperphosphatemia 12/11/2019   Encounter for post surgical wound check 08/16/2019   Post-op pain 08/16/2019   S/P transmetatarsal amputation of foot, right (HCC) 08/16/2019   Unspecified protein-calorie malnutrition (HCC) 07/12/2019   PAD (peripheral artery disease) (HCC) 06/30/2019   Diabetic retinopathy associated with type 2 diabetes mellitus (HCC) 06/23/2019   Other forms of retinal detachment(361.89) 06/23/2019   Secondary hyperparathyroidism (HCC) 06/23/2019   Osteomyelitis, unspecified (HCC) 05/31/2019   Gangrene of toe of right foot (HCC)    Ulcer of right foot, with necrosis of bone (HCC)    Abscess of right foot    Diabetic infection of right foot (HCC)    Ulcer of right second toe with necrosis of bone (HCC)    Cellulitis of right foot 05/22/2019   Acute osteomyelitis of right foot (HCC) 05/15/2019   Osteomyelitis of great toe of right foot (HCC) 05/14/2019   Diabetic foot ulcer- right second toe 05/14/2019   Sepsis (HCC) 05/14/2019   Hypothyroidism, unspecified 01/11/2018   Hypercalcemia 06/14/2016   Dyslipidemia 05/11/2015   Hyperglycemia 05/05/2015   Diarrhea, unspecified 02/01/2015   Fever, unspecified 02/01/2015   Pain, unspecified 02/01/2015   Pruritus, unspecified 02/01/2015   Coagulation defect, unspecified (HCC) 07/07/2014   Encounter for removal of sutures 05/30/2014   Encounter for immunization 04/19/2014   Chest pain 08/23/2013   Type 2 diabetes  mellitus (HCC) 08/23/2013   Hemodialysis status (HCC) 08/23/2013   Chronic kidney disease, stage V (HCC) 08/23/2013   Obesity, unspecified 08/23/2013   Complication of vascular dialysis catheter 12/15/2012   Other fluid overload 12/07/2012   Other specified complication of vascular prosthetic devices, implants and grafts, subsequent encounter 08/06/2012   ESRD on dialysis (HCC) 07/18/2011   Pure hypercholesterolemia 06/23/2011   Iron deficiency anemia, unspecified 06/21/2011   HYPOGLYCEMIA, UNSPECIFIED 11/13/2008   RENAL INSUFFICIENCY 07/31/2008   FOOT ULCER, LEFT 06/29/2008   HYPERLIPIDEMIA 05/16/2008   MRSA INFECTION 10/18/2007   Essential hypertension 10/18/2007    ONSET DATE: 02/12/2023 prosthesis delivery  REFERRING DIAG: 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  He is seeing it help with functional activities.    PERTINENT HISTORY: left BKA, right TMA, DM2, PAD, CAD, MI, ESRD on HD, moderate AS, HTN, hypothyroidism, anemia, CHF  PAIN:  Are you having pain?   Yes: NPRS scale: this morning 0/10 Pain location: low back, muscles of both LEs Pain description: dull ache, sciatica pain Aggravating factors: walking with lean,  Relieving factors: exercises  Residual limb pain 0/10 mainly at distal tibia  PRECAUTIONS: Fall and Other: No BP RUE  WEIGHT BEARING RESTRICTIONS: No  FALLS: Has patient fallen in last 6 months? No  LIVING ENVIRONMENT: Lives with: lives with their spouse Lives in: House Home Access: Ramped entrance Home layout: Two level, 1/2 bath on main level, and stair  lift Stairs: Yes: Internal: 15 steps; on left going up and External: 2-5 steps; can reach both Has following equipment at home: Environmental consultant - 2 wheeled, Wheelchair (manual), [employer], Grab bars, and Ramped entry  PLOF: Independent with household mobility without device and Independent with community mobility without device  PATIENT GOALS:   to use prosthesis to be active including gardening, his yard has hill,   OBJECTIVE:  COGNITION: Overall cognitive status: Within functional limits for tasks assessed   SENSATION: WFL  POSTURE:  Eval / 02/17/2023:  rounded shoulders, forward head, flexed trunk , and weight shift right  LOWER EXTREMITY ROM:  ROM P:passive  A:active Right eval Left eval  Hip flexion    Hip extension Standing -15* Standing  -15*  Hip abduction    Hip adduction    Hip internal rotation    Hip external rotation    Knee flexion    Knee extension    Ankle dorsiflexion    Ankle plantarflexion    Ankle inversion    Ankle eversion     (Blank rows = not tested)  LOWER EXTREMITY MMT:  MMT Right eval Left eval  Hip flexion    Hip extension Functional 4/5 Functional 4/5  Hip abduction Functional 4/5 Functional 4/5  Hip adduction    Hip internal rotation    Hip external rotation    Knee flexion Functional 4/5 Functional 4/5  Knee extension Functional 4/5 Functional 4/5  Ankle dorsiflexion 3-/5 NA  Ankle plantarflexion  NA  Ankle inversion  NA  Ankle eversion  NA  (Blank rows = not tested)  TRANSFERS: Eval / 02/17/2023: Sit to stand: SBA requires use of armrests from 18" chair using back of legs against chair & RW to stabilize Stand to sit: SBA requires use of armrests from 18" chair using back of legs against chair & RW to stabilize  GAIT: 03/26/2023: with cane Gait Velocity 2.13 ft/sec comfortable self-selected pace with SBA & 2.60 ft/sec fast pace with CGA Functional Gait Assessment with cane:    Eval / 02/17/2023: Gait pattern: step to  pattern, decreased step length- Right, decreased stance time- Left, decreased hip/knee flexion- Left, decreased ankle dorsiflexion- Right, Left hip hike, Right foot flat, genu recurvatum- Left, antalgic, trendelenburg, lateral hip instability, trunk flexed, abducted- Left, and poor foot clearance- Right excessive BUE weight bearing on RW Distance walked: 100' Assistive device utilized: Environmental consultant - 2 wheeled and TTA prosthesis Level of assistance: SBA  FUNCTIONAL TESTs:  Eval / 02/17/2023:   Sharlene Motts Balance Scale:  11/56   CURRENT PROSTHETIC WEAR ASSESSMENT: 03/25/2023: pt reports wearing prosthesis all awake hours including to dialysis with some distal tibia pain.  He verbalizes how to adjust ply socks, properly donnes and reports proper cleaning.   Eval /  02/17/2023: Patient is dependent with: skin check, residual limb care, care of non-amputated limb, prosthetic cleaning, ply sock cleaning, correct ply sock adjustment, proper wear schedule/adjustment, and proper weight-bearing schedule/adjustment Donning prosthesis: SBA Doffing prosthesis: SBA Prosthetic wear tolerance: 4 hours, 1x/day, 4 of 6 days since delivery Prosthetic weight bearing tolerance: 5 minutes with limb pain 8/10 Edema: pitting with 5 sec capillary refill Residual limb condition: cylindrical shape, no open areas, limited to no hair growth, scar dryness, normal color & temperature Prosthetic description: silicon liner with pin lock suspension, total contact socket with flexible inner socket, dynamic response foot K code/activity level with prosthetic use: Level 3    TODAY'S TREATMENT:                                                                                                                             DATE:  05/05/2023: Prosthetic Training with Transtibial Amputation: Pt reports limb itching. PT recommended to try to tap socket first to see if vibration overrides the sensation. If he removes prosthesis & liner, then use either  wet washcloth or dry material like prosthetic sock to decrease risk of breaking the skin with fingernails.  He can use hydrocortisone cream at night.  Make sure he thoroughly rinses liner & shrinker as soap residual can cause itching. Pt verbalized understanding.  Pt amb 75' without device with supervision.  PT recommended in house where limited distances and no barriers/ramps/curbs. Pt verbalized understanding.  PT demo & verbal cues on lifting & carrying box.  Pt return demo with light empty bin. Chair behind him for safely. Pt able to carry it 25' X 2 with supervision.  Pt amb 51' with cane carrying 10# kettle bell with supervision. PT demo & verbal cues on offset stance with weight shift between feet: moving 10# kettle bell to back & to front of counter, simulated vacuuming and simulated raking. Pt return demo understanding with CGA. PT demo & verbal cues on use of garden kneel bench. Pt verbalized understanding.    TREATMENT:  DATE:  04/30/2023: Prosthetic Training with Transtibial Amputation:  Pt amb 150' with cane with PT Pt ambulated over obstacle course with cane and CGA/min A for unsteady surface simulation including stepping onto and down from airex pad, walking on yoga mat that had ankle weights underneath, and stepping over black bloster. Performed 4 times total with seated rest break in between Sidestepping on foam beam in bars with with intermittent UE support 3 round trips Tandem walking on foam beam with one UE support in bars 3 round trips  Therapeutic Exercise: Leg press machine DL 43# [id number], then left leg only 25# X 15, Rt leg only 50# X 15   04/21/2023: Prosthetic Training with Transtibial Amputation: PT demo & verbal cues on Correcting rotation of socket on residual limb. Pt verbalized understanding.  PT demo & verbal cues on Floor transfer pushing on  chair seat via half kneeling on RLE. If he falls, first check if injured then check that socket did not rotate on limb prior to getting up.  Pt return demo understanding and verbalized understanding including recommendation for 3 reps daily to build strength & memory for technique.  PT demo & verbal cues for Kneeling using towel roll or folded pillow under right ankle to prevent prolonged pressure on TMA distal limb. Pt verbalized understanding.  Pt amb 150' with cane with PT noting increased flexion of left knee & hip in stance. PT educated in stretches below to address issue.  Therapeutic Exercise: PT demo, verbal & HO cues for stretches to hamstring, hip flexors & quad muscles. See HEP below. Pt return demo 2 reps and verbalized understanding including rationale.    HOME EXERCISE PROGRAM: Access Code: [email or web address] URL: [email or web address] Date: 04/21/2023 Prepared by: Vladimir Faster  Exercises - standing calf stretch with forefoot on small step or brick  - 2 x daily - 7 x weekly - 1 sets - 3 reps - 30 seconds hold - Standing Soleus Stretch on Foam 1/2 Roll  - 2 x daily - 7 x weekly - 1 sets - 13 reps - 30 seconds hold - Upright Stance at Door Frame Single Arm  - 1-3 x daily - 7 x weekly - 1 sets - 2 reps - 2 deep breathes hold - Upright Stance at Door Frame with Both Arms  - 1-3 x daily - 7 x weekly - 1 sets - 2 reps - 2 deep breathes hold - Standing posture with back to counter  - 1 x daily - 5 x weekly - 1 sets - 10 reps - 5 seconds hold - Walk with Head Turns  - 1 x daily - 7 x weekly - 1 sets - 10 reps - Walking with Eyes Closed  - 1 x daily - 7 x weekly - 1 sets - 10 reps - Standing Hip Flexion with Resistance (Mirrored)  - 1 x daily - 7 x weekly - 1 sets - 10 reps - Standing Hip Adduction with Resistance (Mirrored)  - 1 x daily - 7 x weekly - 1 sets - 10 reps - Standing Hip Extension with Resistance  - 1 x daily - 7 x weekly - 1 sets - 10 reps - Standing Hip Abduction  with Theraband Resistance  - 1 x daily - 7 x weekly - 1 sets - 10 reps - standing knee extension  - 1 x daily - 7 x weekly - 1 sets - 10 reps - 5 seconds hold - Seated Table Hamstring Stretch long sit with strap  assist- 1-2 x daily - 7 x weekly - 1 sets - 3 reps - 30 seconds hold - Hip Flexor Stretch at Edge of Bed  - 1-2 x daily - 7 x weekly - 1 sets - 3 reps - 30 seconds hold - Supine Quadriceps Stretch with Strap on Table  - 1-2 x daily - 7 x weekly - 1 sets - 3 reps - 30 seconds hold  ASSESSMENT: CLINICAL IMPRESSION: PT instructed in lifting / carrying weighted items which he improved with instruction / repetition.  Pt improved his ability to perform push / pull functions. He appears on target to meet LTGs next week at end of POC.  He will continue to benefit from skilled PT.  OBJECTIVE IMPAIRMENTS: Abnormal gait, decreased activity tolerance, decreased balance, decreased knowledge of condition, decreased knowledge of use of DME, decreased mobility, difficulty walking, decreased strength, increased edema, impaired flexibility, postural dysfunction, prosthetic dependency , and pain.   ACTIVITY LIMITATIONS: carrying, lifting, bending, standing, squatting, stairs, transfers, and locomotion level  PARTICIPATION LIMITATIONS: meal prep, cleaning, driving, community activity, and yard work  PERSONAL FACTORS: Education, Fitness, Past/current experiences, Time since onset of injury/illness/exacerbation, and 3+ comorbidities: see PMH  are also affecting patient's functional outcome.   REHAB POTENTIAL: Good  CLINICAL DECISION MAKING: Evolving/moderate complexity  EVALUATION COMPLEXITY: Moderate   GOALS: Goals reviewed with patient? Yes  SHORT TERM GOALS: Target date: 03/19/2023  Patient donnes prosthesis modified independent & verbalizes proper cleaning. Baseline: SEE OBJECTIVE DATA Goal status:  MET 03/24/2023 2.  Patient tolerates prosthesis >12 hrs total /day without skin issues or limb  pain <5/10 after standing. Baseline: SEE OBJECTIVE DATA Goal status: MET 03/24/2023  3.  Patient able to stand 30 seconds without UE support with supervision. Baseline: SEE OBJECTIVE DATA Goal status: MET 03/24/2023  4. Patient ambulates 31' with RW & prosthesis with supervision. Baseline: SEE OBJECTIVE DATA Goal status: MET 03/24/2023  5. Patient negotiates ramps & curbs with RW & prosthesis with supervision. Baseline: SEE OBJECTIVE DATA Goal status: MET 03/24/2023  LONG TERM GOALS: Target date: 05/14/2023  Patient demonstrates & verbalized understanding of prosthetic care to enable safe utilization of prosthesis. Baseline: SEE OBJECTIVE DATA Goal status: Ongoing 04/14/2023  Patient tolerates prosthesis wear >90% of awake hours without skin or limb pain issues. Baseline: SEE OBJECTIVE DATA Goal status: Ongoing 04/14/2023  Berg Balance >/= 30 / 56 to indicate lower fall risk Baseline: SEE OBJECTIVE DATA Goal status: 04/14/2023  Patient ambulates >500' with prosthesis & LRAD independently Baseline: SEE OBJECTIVE DATA Goal status: 04/14/2023  Patient negotiates ramps, curbs & stairs with single rail with prosthesis & LRAD independently. Baseline: SEE OBJECTIVE DATA Goal status: 04/14/2023   PLAN:  PT FREQUENCY: 2x/week  PT DURATION: 12 weeks  PLANNED INTERVENTIONS: Therapeutic exercises, Therapeutic activity, Neuromuscular re-education, Balance training, Gait training, Patient/Family education, Self Care, Stair training, Prosthetic training, DME instructions, Re-evaluation, and physical performance testing  PLAN FOR NEXT SESSION: continue with progressive functional activities, check HEP,   Continue standing balance activities facilitating balance strategies. Plan to discharge next week at end of POC   Vladimir Faster, PT, DPT 05/05/2023, 4:25 PM

## 2023-05-07 ENCOUNTER — Encounter: Payer: Self-pay | Admitting: Physical Therapy

## 2023-05-07 ENCOUNTER — Ambulatory Visit: Payer: Medicare Other | Admitting: Physical Therapy

## 2023-05-07 DIAGNOSIS — R2689 Other abnormalities of gait and mobility: Secondary | ICD-10-CM

## 2023-05-07 DIAGNOSIS — M79662 Pain in left lower leg: Secondary | ICD-10-CM | POA: Diagnosis not present

## 2023-05-07 DIAGNOSIS — M6281 Muscle weakness (generalized): Secondary | ICD-10-CM

## 2023-05-07 DIAGNOSIS — R2681 Unsteadiness on feet: Secondary | ICD-10-CM

## 2023-05-07 NOTE — Therapy (Signed)
OUTPATIENT PHYSICAL THERAPY PROSTHETICS TREATMENT   Patient Name: [name] MRN: [id number] DOB:19-Apr-1966, 57 y.o., male Today's Date: 05/07/2023  PCP: [name], MD REFERRING PROVIDER: Marcello Fennel, MD  END OF SESSION:  PT End of Session - 05/07/23 1014     Visit Number 17    Number of Visits 24    Date for PT Re-Evaluation 05/14/23    Authorization Type Medicare & BCBS    Progress Note Due on Visit 20    PT Start Time 1014    PT Stop Time 1100    PT Time Calculation (min) 46 min    Equipment Utilized During Treatment Gait belt    Activity Tolerance Patient tolerated treatment well;Patient limited by pain    Behavior During Therapy [facility] for tasks assessed/performed                     Past Medical History:  Diagnosis Date   Acute osteomyelitis of right foot (HCC) 05/15/2019   Anemia 06/23/2019   Aortic stenosis    Bursitis    right shoulder   Complication of anesthesia    Coronary artery disease    Diabetes with renal manifestations(250.4) 08/23/2013   Diabetic infection of right foot (HCC)    Diabetic retinopathy associated with type 2 diabetes mellitus (HCC) 06/23/2019   ESRD on hemodialysis (HCC) 07/18/2011   Essential hypertension 10/18/2007   Qualifier: Diagnosis of  By: Everardo All MD, Gregary Signs A    History of blood transfusion    HYPERLIPIDEMIA 05/16/2008   Qualifier: Diagnosis of  By: Jonny Ruiz MD, Len Blalock    MRSA INFECTION 10/18/2007   Qualifier: Diagnosis of  By: Tyrone Apple, Lucy     Other forms of retinal detachment(361.89) 06/23/2019   PAD (peripheral artery disease) (HCC)    PONV (postoperative nausea and vomiting)    Prolonged QT interval    Pulmonary hypertension (HCC)    Secondary hyperparathyroidism (HCC) 06/23/2019   Sepsis (HCC) 05/14/2019   Past Surgical History:  Procedure Laterality Date   ABDOMINAL AORTOGRAM N/A 05/17/2019   Procedure: ABDOMINAL AORTOGRAM;  Surgeon: Sherren Kerns, MD;  Location: [facility] INVASIVE CV LAB;   Service: Cardiovascular;  Laterality: N/A;   AMPUTATION TOE Right 05/14/2019   Procedure: PARTIAL AMPUTATION SECOND TOE RIGHT FOOT;  Surgeon: Park Liter, DPM;  Location: [facility] OR;  Service: Podiatry;  Laterality: Right;   AMPUTATION TOE Right 05/23/2019   Procedure: AMPUTATION OF SECOND TOE METATARSAL PHALANGEAL JOINT;  Surgeon: Park Liter, DPM;  Location: [facility] OR;  Service: Podiatry;  Laterality: Right;   AMPUTATION TOE Right 05/27/2019   Procedure: Amputation Toe;  Surgeon: Park Liter, DPM;  Location: [facility] OR;  Service: Podiatry;  Laterality: Right;  right third toe   APPLICATION OF WOUND VAC Right 05/27/2019   Procedure: Application Of Wound Vac;  Surgeon: Park Liter, DPM;  Location: [facility] OR;  Service: Podiatry;  Laterality: Right;   AV FISTULA PLACEMENT  06-18-11   Right brachiocephalic AVF   BONE BIOPSY Right 05/14/2019   Procedure: OPEN SUPERFICIAL BONE BIOPSY GREAT TOE;  Surgeon: Park Liter, DPM;  Location: [facility] OR;  Service: Podiatry;  Laterality: Right;   CARDIAC CATHETERIZATION     CORONARY BALLOON ANGIOPLASTY N/A 09/24/2020   Procedure: CORONARY BALLOON ANGIOPLASTY;  Surgeon: Swaziland, Peter M, MD;  Location: [facility] INVASIVE CV LAB;  Service: Cardiovascular;  Laterality: N/A;   CORONARY PRESSURE/FFR STUDY N/A 09/24/2020   Procedure: INTRAVASCULAR PRESSURE WIRE/FFR STUDY;  Surgeon: Swaziland, Peter M, MD;  Location: [facility] INVASIVE CV LAB;  Service: Cardiovascular;  Laterality: N/A;   CORONARY STENT INTERVENTION N/A 09/24/2020   Procedure: CORONARY STENT INTERVENTION;  Surgeon: Swaziland, Peter M, MD;  Location: [facility] INVASIVE CV LAB;  Service: Cardiovascular;  Laterality: N/A;   EYE SURGERY     left eye for Laser, diabetic retinopathy   FISTULOGRAM Right 11/06/2011   Procedure: FISTULOGRAM;  Surgeon: Fransisco Hertz, MD;  Location: [facility] CATH LAB;  Service: Cardiovascular;  Laterality: Right;   HEMATOMA EVACUATION Right Feb. 25, 2014   HEMATOMA EVACUATION Right 12/14/2012   Procedure: EVACUATION  HEMATOMA;  Surgeon: Fransisco Hertz, MD;  Location: [facility] OR;  Service: Vascular;  Laterality: Right;   INCISION AND DRAINAGE Right 05/23/2019   Procedure: INCISION AND DRAINAGE OF RIGHT FOOT DEEP SPACE ABSCESS;  Surgeon: Park Liter, DPM;  Location: [facility] OR;  Service: Podiatry;  Laterality: Right;   INJECTION OF SILICONE OIL Left 01/09/2023   Procedure: INJECTION OF SILICONE OIL;  Surgeon: Edmon Crape, MD;  Location: [facility] OR;  Service: Ophthalmology;  Laterality: Left;   INSERTION OF DIALYSIS CATHETER  12/14/2012   Procedure: INSERTION OF DIALYSIS CATHETER;  Surgeon: Fransisco Hertz, MD;  Location: [facility] OR;  Service: Vascular;;   INSERTION OF DIALYSIS CATHETER Right 01/07/2021   Procedure: INSERTION OF DIALYSIS CATHETER;  Surgeon: Sherren Kerns, MD;  Location: [facility] OR;  Service: Vascular;  Laterality: Right;   INSERTION OF DIALYSIS CATHETER Left 01/08/2021   Procedure: INSERTION OF LEFT INTERNAL JUGULAR DIALYSIS CATHETER, tunneled;  Surgeon: Chuck Hint, MD;  Location: [facility] OR;  Service: Vascular;  Laterality: Left;   IRRIGATION AND DEBRIDEMENT FOOT Right 05/25/2019   Procedure: INCISION AND DRAINAGE FOOT;  Surgeon: Park Liter, DPM;  Location: [facility] OR;  Service: Podiatry;  Laterality: Right;   IRRIGATION AND DEBRIDEMENT FOOT Right 05/27/2019   Procedure: IRRIGATION AND DEBRIDEMENT FOOT;  Surgeon: Park Liter, DPM;  Location: [facility] OR;  Service: Podiatry;  Laterality: Right;   LASER PHOTO ABLATION Left 01/09/2023   Procedure: LASER PHOTO ABLATION;  Surgeon: Edmon Crape, MD;  Location: [facility] OR;  Service: Ophthalmology;  Laterality: Left;   LEFT HEART CATH AND CORONARY ANGIOGRAPHY N/A 09/24/2020   Procedure: LEFT HEART CATH AND CORONARY ANGIOGRAPHY;  Surgeon: Swaziland, Peter M, MD;  Location: [facility] INVASIVE CV LAB;  Service: Cardiovascular;  Laterality: N/A;   LOWER EXTREMITY ANGIOGRAPHY Bilateral 05/17/2019   Procedure: LOWER EXTREMITY ANGIOGRAPHY;  Surgeon: Sherren Kerns, MD;  Location: [facility] INVASIVE  CV LAB;  Service: Cardiovascular;  Laterality: Bilateral;   PARS PLANA VITRECTOMY Left 01/09/2023   Procedure: PARS PLANA VITRECTOMY WITH 25 GAUGE AND REPAIR OF RETINAL DETACHMENT;  Surgeon: Edmon Crape, MD;  Location: [name] OR;  Service: Ophthalmology;  Laterality: Left;   REMOVAL OF A DIALYSIS CATHETER Right 01/08/2021   Procedure: REMOVAL OF TEMPORARY DIALYSIS CATHETER, LEFT FEMORAL VEIN;  Surgeon: Chuck Hint, MD;  Location: [facility] OR;  Service: Vascular;  Laterality: Right;   REVISON OF ARTERIOVENOUS FISTULA Right 12/14/2012   Procedure: REVISON OF upper arm ARTERIOVENOUS FISTULA using 48mmx10cm gortex graft;  Surgeon: Fransisco Hertz, MD;  Location: [facility] OR;  Service: Vascular;  Laterality: Right;   REVISON OF ARTERIOVENOUS FISTULA Right 12/27/2020   Procedure: RIGHT UPPER EXTREMITY ARTERIOVENOUS FISTULA REVISiON;  Surgeon: Leonie Douglas, MD;  Location: [facility] OR;  Service: Vascular;  Laterality: Right;  PERIPHERAL NERVE BLOCK   RIGHT/LEFT HEART CATH AND CORONARY ANGIOGRAPHY N/A  08/07/2020   Procedure: RIGHT/LEFT HEART CATH AND CORONARY ANGIOGRAPHY;  Surgeon: Swaziland, Peter M, MD;  Location: [facility] INVASIVE CV LAB;  Service: Cardiovascular;  Laterality: N/A;   SOFT TISSUE MASS EXCISION     Right arm, Left leg  for MRSA infection   Patient Active Problem List   Diagnosis Date Noted   ACS (acute coronary syndrome) (HCC) 09/12/2021   Prolonged QT interval 09/12/2021   Unstable angina (HCC) 08/20/2021   Nuclear sclerotic cataract of right eye 06/11/2021   Nuclear sclerotic cataract of left eye 06/11/2021   Posterior subcapsular age-related cataract, left eye 06/11/2021   Stable treated proliferative diabetic retinopathy of left eye determined by examination associated with type 2 diabetes mellitus (HCC) 06/11/2021   Right epiretinal membrane 06/11/2021   Stable treated proliferative diabetic retinopathy of right eye determined by examination associated with type 2 diabetes mellitus (HCC) 06/11/2021    Hyperkalemia 01/09/2021   ESRD (end stage renal disease) (HCC)    Dialysis AV fistula malfunction, initial encounter (HCC) 01/07/2021   NSTEMI (non-ST elevated myocardial infarction) (HCC) 09/21/2020   3-vessel coronary artery disease 08/09/2020   Pulmonary hypertension, unspecified (HCC) 08/09/2020   Pancytopenia (HCC) 08/06/2020   Allergy, unspecified, subsequent encounter 06/28/2020   Anaphylactic shock, unspecified, subsequent encounter 06/28/2020   Lymphedema of both lower extremities 12/29/2019   Hyperphosphatemia 12/11/2019   Encounter for post surgical wound check 08/16/2019   Post-op pain 08/16/2019   S/P transmetatarsal amputation of foot, right (HCC) 08/16/2019   Unspecified protein-calorie malnutrition (HCC) 07/12/2019   PAD (peripheral artery disease) (HCC) 06/30/2019   Diabetic retinopathy associated with type 2 diabetes mellitus (HCC) 06/23/2019   Other forms of retinal detachment(361.89) 06/23/2019   Secondary hyperparathyroidism (HCC) 06/23/2019   Osteomyelitis, unspecified (HCC) 05/31/2019   Gangrene of toe of right foot (HCC)    Ulcer of right foot, with necrosis of bone (HCC)    Abscess of right foot    Diabetic infection of right foot (HCC)    Ulcer of right second toe with necrosis of bone (HCC)    Cellulitis of right foot 05/22/2019   Acute osteomyelitis of right foot (HCC) 05/15/2019   Osteomyelitis of great toe of right foot (HCC) 05/14/2019   Diabetic foot ulcer- right second toe 05/14/2019   Sepsis (HCC) 05/14/2019   Hypothyroidism, unspecified 01/11/2018   Hypercalcemia 06/14/2016   Dyslipidemia 05/11/2015   Hyperglycemia 05/05/2015   Diarrhea, unspecified 02/01/2015   Fever, unspecified 02/01/2015   Pain, unspecified 02/01/2015   Pruritus, unspecified 02/01/2015   Coagulation defect, unspecified (HCC) 07/07/2014   Encounter for removal of sutures 05/30/2014   Encounter for immunization 04/19/2014   Chest pain 08/23/2013   Type 2 diabetes  mellitus (HCC) 08/23/2013   Hemodialysis status (HCC) 08/23/2013   Chronic kidney disease, stage V (HCC) 08/23/2013   Obesity, unspecified 08/23/2013   Complication of vascular dialysis catheter 12/15/2012   Other fluid overload 12/07/2012   Other specified complication of vascular prosthetic devices, implants and grafts, subsequent encounter 08/06/2012   ESRD on dialysis (HCC) 07/18/2011   Pure hypercholesterolemia 06/23/2011   Iron deficiency anemia, unspecified 06/21/2011   HYPOGLYCEMIA, UNSPECIFIED 11/13/2008   RENAL INSUFFICIENCY 07/31/2008   FOOT ULCER, LEFT 06/29/2008   HYPERLIPIDEMIA 05/16/2008   MRSA INFECTION 10/18/2007   Essential hypertension 10/18/2007    ONSET DATE: 02/12/2023 prosthesis delivery  REFERRING DIAG: 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    He was able to lift and carry laundry basket.    PERTINENT HISTORY: left BKA, right TMA, DM2, PAD, CAD, MI, ESRD on HD, moderate AS, HTN, hypothyroidism, anemia, CHF  PAIN:  Are you having pain?   Yes: NPRS scale: this morning 0/10 Pain location: low back, muscles of both LEs Pain description: dull ache, sciatica pain Aggravating factors: walking with lean,  Relieving factors: exercises  Residual limb pain 0/10 mainly at distal tibia  PRECAUTIONS: Fall and Other: No BP RUE  WEIGHT BEARING RESTRICTIONS: No  FALLS: Has patient fallen in last 6 months? No  LIVING ENVIRONMENT: Lives with: lives with their spouse Lives in: House Home Access: Ramped entrance Home layout: Two level, 1/2 bath on main level, and  stair lift Stairs: Yes: Internal: 15 steps; on left going up and External: 2-5 steps; can reach both Has following equipment at home: Environmental consultant - 2 wheeled, Wheelchair (manual), [employer], Grab bars, and Ramped entry  PLOF: Independent with household mobility without device and Independent with community mobility without device  PATIENT GOALS:   to use prosthesis to be active including gardening, his yard has hill,   OBJECTIVE:  COGNITION: Overall cognitive status: Within functional limits for tasks assessed   SENSATION: WFL  POSTURE:  Eval / 02/17/2023:  rounded shoulders, forward head, flexed trunk , and weight shift right  LOWER EXTREMITY ROM:  ROM P:passive  A:active Right eval Left eval  Hip flexion    Hip extension Standing -15* Standing  -15*  Hip abduction    Hip adduction    Hip internal rotation    Hip external rotation    Knee flexion    Knee extension    Ankle dorsiflexion    Ankle plantarflexion    Ankle inversion    Ankle eversion     (Blank rows = not tested)  LOWER EXTREMITY MMT:  MMT Right eval Left eval  Hip flexion    Hip extension Functional 4/5 Functional 4/5  Hip abduction Functional 4/5 Functional 4/5  Hip adduction    Hip internal rotation    Hip external rotation    Knee flexion Functional 4/5 Functional 4/5  Knee extension Functional 4/5 Functional 4/5  Ankle dorsiflexion 3-/5 NA  Ankle plantarflexion  NA  Ankle inversion  NA  Ankle eversion  NA  (Blank rows = not tested)  TRANSFERS: Eval / 02/17/2023: Sit to stand: SBA requires use of armrests from 18" chair using back of legs against chair & RW to stabilize Stand to sit: SBA requires use of armrests from 18" chair using back of legs against chair & RW to stabilize  GAIT: 03/26/2023: with cane Gait Velocity 2.13 ft/sec comfortable self-selected pace with SBA & 2.60 ft/sec fast pace with CGA Functional Gait Assessment with cane:    Eval / 02/17/2023: Gait pattern:  step to pattern, decreased step length- Right, decreased stance time- Left, decreased hip/knee flexion- Left, decreased ankle dorsiflexion- Right, Left hip hike, Right foot flat, genu recurvatum- Left, antalgic, trendelenburg, lateral hip instability, trunk flexed, abducted- Left, and poor foot clearance- Right excessive BUE weight bearing on RW Distance walked: 100' Assistive device utilized: Environmental consultant - 2 wheeled and TTA prosthesis Level of assistance: SBA  FUNCTIONAL  TESTs:  Eval / 4/30/2024Sharlene Motts Balance Scale: 11/56   CURRENT PROSTHETIC WEAR ASSESSMENT: 05/07/2023: Residual limb has not open areas. One 1mm circle on incision that could be internal suture pushing itself out. Pt has + tinel for neuroma.   03/25/2023: pt reports wearing prosthesis all awake hours including to dialysis with some distal tibia pain.  He verbalizes how to adjust ply socks, properly donnes and reports proper cleaning.   Eval /  02/17/2023: Patient is dependent with: skin check, residual limb care, care of non-amputated limb, prosthetic cleaning, ply sock cleaning, correct ply sock adjustment, proper wear schedule/adjustment, and proper weight-bearing schedule/adjustment Donning prosthesis: SBA Doffing prosthesis: SBA Prosthetic wear tolerance: 4 hours, 1x/day, 4 of 6 days since delivery Prosthetic weight bearing tolerance: 5 minutes with limb pain 8/10 Edema: pitting with 5 sec capillary refill Residual limb condition: cylindrical shape, no open areas, limited to no hair growth, scar dryness, normal color & temperature Prosthetic description: silicon liner with pin lock suspension, total contact socket with flexible inner socket, dynamic response foot K code/activity level with prosthetic use: Level 3    TODAY'S TREATMENT:                                                                                                                             DATE:  05/07/2023: Prosthetic Training with Transtibial  Amputation: PT verbal cues on neuroma and possible management options.  PT educated verbally on wife pulling internal incision if it comes out far enough to grasp with tweezers. PT advised to fully remove any lotion from limb prior to donning prosthesis. Pt verbalized understanding.   Therapeutic Exercise:  Pt plans to join a gym in the near future. PT educated pt on safety and proper use of equipment with TTA prosthesis. PT demo & verbal cues and pt return demo & verbalized understanding.  -recumbent bike including set-up -treadmill safety. Pt amb at 1.3 mph for 2 min.  -leg press BLEs 100# 10 reps; single leg with BLES 50# 5 reps ea. -UE resistance machines with pelvis level by positioning prosthesis  seated chest press BUEs 15# 5 reps -knee ext machine BLEs 15# 10 reps -knee flex machine BLEs 25# 15 reps     TREATMENT:  DATE:  05/05/2023: Prosthetic Training with Transtibial Amputation: Pt reports limb itching. PT recommended to try to tap socket first to see if vibration overrides the sensation. If he removes prosthesis & liner, then use either wet washcloth or dry material like prosthetic sock to decrease risk of breaking the skin with fingernails.  He can use hydrocortisone cream at night.  Make sure he thoroughly rinses liner & shrinker as soap residual can cause itching. Pt verbalized understanding.  Pt amb 75' without device with supervision.  PT recommended in house where limited distances and no barriers/ramps/curbs. Pt verbalized understanding.  PT demo & verbal cues on lifting & carrying box.  Pt return demo with light empty bin. Chair behind him for safely. Pt able to carry it 25' X 2 with supervision.  Pt amb 52' with cane carrying 10# kettle bell with supervision. PT demo & verbal cues on offset stance with weight shift between feet: moving 10# kettle bell to  back & to front of counter, simulated vacuuming and simulated raking. Pt return demo understanding with CGA. PT demo & verbal cues on use of garden kneel bench. Pt verbalized understanding.    TREATMENT:                                                                                                                             DATE:  04/30/2023: Prosthetic Training with Transtibial Amputation:  Pt amb 150' with cane with PT Pt ambulated over obstacle course with cane and CGA/min A for unsteady surface simulation including stepping onto and down from airex pad, walking on yoga mat that had ankle weights underneath, and stepping over black bloster. Performed 4 times total with seated rest break in between Sidestepping on foam beam in bars with with intermittent UE support 3 round trips Tandem walking on foam beam with one UE support in bars 3 round trips  Therapeutic Exercise: Leg press machine DL 40# 9W11, then left leg only 25# X 15, Rt leg only 50# X 15   HOME EXERCISE PROGRAM: Access Code: [email or web address] URL: [email or web address] Date: 04/21/2023 Prepared by: Vladimir Faster  Exercises - standing calf stretch with forefoot on small step or brick  - 2 x daily - 7 x weekly - 1 sets - 3 reps - 30 seconds hold - Standing Soleus Stretch on Foam 1/2 Roll  - 2 x daily - 7 x weekly - 1 sets - 13 reps - 30 seconds hold - Upright Stance at Door Frame Single Arm  - 1-3 x daily - 7 x weekly - 1 sets - 2 reps - 2 deep breathes hold - Upright Stance at Door Frame with Both Arms  - 1-3 x daily - 7 x weekly - 1 sets - 2 reps - 2 deep breathes hold - Standing posture with back to counter  - 1 x daily - 5 x weekly - 1 sets - 10 reps - 5 seconds hold - Walk with Head Turns  -  1 x daily - 7 x weekly - 1 sets - 10 reps - Walking with Eyes Closed  - 1 x daily - 7 x weekly - 1 sets - 10 reps - Standing Hip Flexion with Resistance (Mirrored)  - 1 x daily - 7 x weekly - 1 sets - 10 reps - Standing Hip  Adduction with Resistance (Mirrored)  - 1 x daily - 7 x weekly - 1 sets - 10 reps - Standing Hip Extension with Resistance  - 1 x daily - 7 x weekly - 1 sets - 10 reps - Standing Hip Abduction with Theraband Resistance  - 1 x daily - 7 x weekly - 1 sets - 10 reps - standing knee extension  - 1 x daily - 7 x weekly - 1 sets - 10 reps - 5 seconds hold - Seated Table Hamstring Stretch long sit with strap assist- 1-2 x daily - 7 x weekly - 1 sets - 3 reps - 30 seconds hold - Hip Flexor Stretch at Edge of Bed  - 1-2 x daily - 7 x weekly - 1 sets - 3 reps - 30 seconds hold - Supine Quadriceps Stretch with Strap on Table  - 1-2 x daily - 7 x weekly - 1 sets - 3 reps - 30 seconds hold  ASSESSMENT: CLINICAL IMPRESSION: PT educated pt on use of gym equipment with prosthesis which he appears to understand.   OBJECTIVE IMPAIRMENTS: Abnormal gait, decreased activity tolerance, decreased balance, decreased knowledge of condition, decreased knowledge of use of DME, decreased mobility, difficulty walking, decreased strength, increased edema, impaired flexibility, postural dysfunction, prosthetic dependency , and pain.   ACTIVITY LIMITATIONS: carrying, lifting, bending, standing, squatting, stairs, transfers, and locomotion level  PARTICIPATION LIMITATIONS: meal prep, cleaning, driving, community activity, and yard work  PERSONAL FACTORS: Education, Fitness, Past/current experiences, Time since onset of injury/illness/exacerbation, and 3+ comorbidities: see PMH  are also affecting patient's functional outcome.   REHAB POTENTIAL: Good  CLINICAL DECISION MAKING: Evolving/moderate complexity  EVALUATION COMPLEXITY: Moderate   GOALS: Goals reviewed with patient? Yes  SHORT TERM GOALS: Target date: 03/19/2023  Patient donnes prosthesis modified independent & verbalizes proper cleaning. Baseline: SEE OBJECTIVE DATA Goal status:  MET 03/24/2023 2.  Patient tolerates prosthesis >12 hrs total /day without skin  issues or limb pain <5/10 after standing. Baseline: SEE OBJECTIVE DATA Goal status: MET 03/24/2023  3.  Patient able to stand 30 seconds without UE support with supervision. Baseline: SEE OBJECTIVE DATA Goal status: MET 03/24/2023  4. Patient ambulates 85' with RW & prosthesis with supervision. Baseline: SEE OBJECTIVE DATA Goal status: MET 03/24/2023  5. Patient negotiates ramps & curbs with RW & prosthesis with supervision. Baseline: SEE OBJECTIVE DATA Goal status: MET 03/24/2023  LONG TERM GOALS: Target date: 05/14/2023  Patient demonstrates & verbalized understanding of prosthetic care to enable safe utilization of prosthesis. Baseline: SEE OBJECTIVE DATA Goal status: Ongoing 04/14/2023  Patient tolerates prosthesis wear >90% of awake hours without skin or limb pain issues. Baseline: SEE OBJECTIVE DATA Goal status: Ongoing 04/14/2023  Berg Balance >/= 30 / 56 to indicate lower fall risk Baseline: SEE OBJECTIVE DATA Goal status: 04/14/2023  Patient ambulates >500' with prosthesis & LRAD independently Baseline: SEE OBJECTIVE DATA Goal status: 04/14/2023  Patient negotiates ramps, curbs & stairs with single rail with prosthesis & LRAD independently. Baseline: SEE OBJECTIVE DATA Goal status: 04/14/2023   PLAN:  PT FREQUENCY: 2x/week  PT DURATION: 12 weeks  PLANNED INTERVENTIONS: Therapeutic exercises, Therapeutic activity, Neuromuscular  re-education, Balance training, Gait training, Patient/Family education, Self Care, Stair training, Prosthetic training, DME instructions, Re-evaluation, and physical performance testing  PLAN FOR NEXT SESSION: begin to check LTGs, Plan to discharge next week at end of POC   Vladimir Faster, PT, DPT 05/07/2023, 11:08 AM

## 2023-05-12 ENCOUNTER — Encounter: Payer: Self-pay | Admitting: Physical Therapy

## 2023-05-12 ENCOUNTER — Ambulatory Visit (INDEPENDENT_AMBULATORY_CARE_PROVIDER_SITE_OTHER): Payer: Medicare Other | Admitting: Physical Therapy

## 2023-05-12 ENCOUNTER — Ambulatory Visit: Payer: Self-pay | Admitting: Licensed Clinical Social Worker

## 2023-05-12 DIAGNOSIS — R2689 Other abnormalities of gait and mobility: Secondary | ICD-10-CM

## 2023-05-12 DIAGNOSIS — M6281 Muscle weakness (generalized): Secondary | ICD-10-CM | POA: Diagnosis not present

## 2023-05-12 DIAGNOSIS — M79662 Pain in left lower leg: Secondary | ICD-10-CM

## 2023-05-12 DIAGNOSIS — R2681 Unsteadiness on feet: Secondary | ICD-10-CM

## 2023-05-12 NOTE — Therapy (Addendum)
OUTPATIENT PHYSICAL THERAPY PROSTHETICS TREATMENT   Patient Name: [name] MRN: [id number] DOB:[birth year], 57 y.o., male Today's Date: 05/12/2023  PCP: [name], MD REFERRING PROVIDER: Marcello Fennel, MD  END OF SESSION:  PT End of Session - 05/12/23 1003     Visit Number 18    Number of Visits 24    Date for PT Re-Evaluation 05/14/23    Authorization Type Medicare & BCBS    Progress Note Due on Visit 20    PT Start Time 1011    PT Stop Time 1034    PT Time Calculation (min) 23 min    Equipment Utilized During Treatment Gait belt    Activity Tolerance Patient tolerated treatment well;Patient limited by pain    Behavior During Therapy [facility] for tasks assessed/performed                      Past Medical History:  Diagnosis Date   Acute osteomyelitis of right foot (HCC) 05/15/2019   Anemia 06/23/2019   Aortic stenosis    Bursitis    right shoulder   Complication of anesthesia    Coronary artery disease    Diabetes with renal manifestations(250.4) 08/23/2013   Diabetic infection of right foot (HCC)    Diabetic retinopathy associated with type 2 diabetes mellitus (HCC) 06/23/2019   ESRD on hemodialysis (HCC) 07/18/2011   Essential hypertension 10/18/2007   Qualifier: Diagnosis of  By: Everardo All MD, Gregary Signs A    History of blood transfusion    HYPERLIPIDEMIA 05/16/2008   Qualifier: Diagnosis of  By: Jonny Ruiz MD, Len Blalock    MRSA INFECTION 10/18/2007   Qualifier: Diagnosis of  By: Tyrone Apple, Lucy     Other forms of retinal detachment(361.89) 06/23/2019   PAD (peripheral artery disease) (HCC)    PONV (postoperative nausea and vomiting)    Prolonged QT interval    Pulmonary hypertension (HCC)    Secondary hyperparathyroidism (HCC) 06/23/2019   Sepsis (HCC) 05/14/2019   Past Surgical History:  Procedure Laterality Date   ABDOMINAL AORTOGRAM N/A 05/17/2019   Procedure: ABDOMINAL AORTOGRAM;  Surgeon: Sherren Kerns, MD;  Location: [facility] INVASIVE CV LAB;   Service: Cardiovascular;  Laterality: N/A;   AMPUTATION TOE Right 05/14/2019   Procedure: PARTIAL AMPUTATION SECOND TOE RIGHT FOOT;  Surgeon: Park Liter, DPM;  Location: [facility] OR;  Service: Podiatry;  Laterality: Right;   AMPUTATION TOE Right 05/23/2019   Procedure: AMPUTATION OF SECOND TOE METATARSAL PHALANGEAL JOINT;  Surgeon: Park Liter, DPM;  Location: [facility] OR;  Service: Podiatry;  Laterality: Right;   AMPUTATION TOE Right 05/27/2019   Procedure: Amputation Toe;  Surgeon: Park Liter, DPM;  Location: [facility] OR;  Service: Podiatry;  Laterality: Right;  right third toe   APPLICATION OF WOUND VAC Right 05/27/2019   Procedure: Application Of Wound Vac;  Surgeon: Park Liter, DPM;  Location: [facility] OR;  Service: Podiatry;  Laterality: Right;   AV FISTULA PLACEMENT  06-18-11   Right brachiocephalic AVF   BONE BIOPSY Right 05/14/2019   Procedure: OPEN SUPERFICIAL BONE BIOPSY GREAT TOE;  Surgeon: Park Liter, DPM;  Location: [facility] OR;  Service: Podiatry;  Laterality: Right;   CARDIAC CATHETERIZATION     CORONARY BALLOON ANGIOPLASTY N/A 09/24/2020   Procedure: CORONARY BALLOON ANGIOPLASTY;  Surgeon: Swaziland, Peter M, MD;  Location: [facility] INVASIVE CV LAB;  Service: Cardiovascular;  Laterality: N/A;   CORONARY PRESSURE/FFR STUDY N/A 09/24/2020   Procedure: INTRAVASCULAR PRESSURE WIRE/FFR STUDY;  Surgeon: Swaziland, Peter M, MD;  Location: [facility] INVASIVE CV LAB;  Service: Cardiovascular;  Laterality: N/A;   CORONARY STENT INTERVENTION N/A 09/24/2020   Procedure: CORONARY STENT INTERVENTION;  Surgeon: Swaziland, Peter M, MD;  Location: [facility] INVASIVE CV LAB;  Service: Cardiovascular;  Laterality: N/A;   EYE SURGERY     left eye for Laser, diabetic retinopathy   FISTULOGRAM Right 11/06/2011   Procedure: FISTULOGRAM;  Surgeon: Fransisco Hertz, MD;  Location: [facility] CATH LAB;  Service: Cardiovascular;  Laterality: Right;   HEMATOMA EVACUATION Right Feb. 25, 2014   HEMATOMA EVACUATION Right 12/14/2012   Procedure: EVACUATION  HEMATOMA;  Surgeon: Fransisco Hertz, MD;  Location: [facility] OR;  Service: Vascular;  Laterality: Right;   INCISION AND DRAINAGE Right 05/23/2019   Procedure: INCISION AND DRAINAGE OF RIGHT FOOT DEEP SPACE ABSCESS;  Surgeon: Park Liter, DPM;  Location: [facility] OR;  Service: Podiatry;  Laterality: Right;   INJECTION OF SILICONE OIL Left 01/09/2023   Procedure: INJECTION OF SILICONE OIL;  Surgeon: Edmon Crape, MD;  Location: [facility] OR;  Service: Ophthalmology;  Laterality: Left;   INSERTION OF DIALYSIS CATHETER  12/14/2012   Procedure: INSERTION OF DIALYSIS CATHETER;  Surgeon: Fransisco Hertz, MD;  Location: [facility] OR;  Service: Vascular;;   INSERTION OF DIALYSIS CATHETER Right 01/07/2021   Procedure: INSERTION OF DIALYSIS CATHETER;  Surgeon: Sherren Kerns, MD;  Location: [facility] OR;  Service: Vascular;  Laterality: Right;   INSERTION OF DIALYSIS CATHETER Left 01/08/2021   Procedure: INSERTION OF LEFT INTERNAL JUGULAR DIALYSIS CATHETER, tunneled;  Surgeon: Chuck Hint, MD;  Location: [facility] OR;  Service: Vascular;  Laterality: Left;   IRRIGATION AND DEBRIDEMENT FOOT Right 05/25/2019   Procedure: INCISION AND DRAINAGE FOOT;  Surgeon: Park Liter, DPM;  [facility]: [facility] OR;  Service: Podiatry;  Laterality: Right;   IRRIGATION AND DEBRIDEMENT FOOT Right 05/27/2019   Procedure: IRRIGATION AND DEBRIDEMENT FOOT;  Surgeon: Park Liter, DPM;  Location: [facility] OR;  Service: Podiatry;  Laterality: Right;   LASER PHOTO ABLATION Left 01/09/2023   Procedure: LASER PHOTO ABLATION;  Surgeon: Edmon Crape, MD;  Location: [facility] OR;  Service: Ophthalmology;  Laterality: Left;   LEFT HEART CATH AND CORONARY ANGIOGRAPHY N/A 09/24/2020   Procedure: LEFT HEART CATH AND CORONARY ANGIOGRAPHY;  Surgeon: Swaziland, Peter M, MD;  Location: [facility] INVASIVE CV LAB;  Service: Cardiovascular;  Laterality: N/A;   LOWER EXTREMITY ANGIOGRAPHY Bilateral 05/17/2019   Procedure: LOWER EXTREMITY ANGIOGRAPHY;  Surgeon: Sherren Kerns, MD;  [facility]: [facility] INVASIVE  CV LAB;  Service: Cardiovascular;  Laterality: Bilateral;   PARS PLANA VITRECTOMY Left 01/09/2023   Procedure: PARS PLANA VITRECTOMY WITH 25 GAUGE AND REPAIR OF RETINAL DETACHMENT;  Surgeon: Edmon Crape, MD;  Location: [facility] OR;  Service: Ophthalmology;  Laterality: Left;   REMOVAL OF A DIALYSIS CATHETER Right 01/08/2021   Procedure: REMOVAL OF TEMPORARY DIALYSIS CATHETER, LEFT FEMORAL VEIN;  Surgeon: Chuck Hint, MD;  Location: [facility] OR;  Service: Vascular;  Laterality: Right;   REVISON OF ARTERIOVENOUS FISTULA Right 12/14/2012   Procedure: REVISON OF upper arm ARTERIOVENOUS FISTULA using 86mmx10cm gortex graft;  Surgeon: Fransisco Hertz, MD;  Location: [facility] OR;  Service: Vascular;  Laterality: Right;   REVISON OF ARTERIOVENOUS FISTULA Right 12/27/2020   Procedure: RIGHT UPPER EXTREMITY ARTERIOVENOUS FISTULA REVISiON;  Surgeon: Leonie Douglas, MD;  Location: [facility] OR;  Service: Vascular;  Laterality: Right;  PERIPHERAL NERVE BLOCK   RIGHT/LEFT HEART CATH AND CORONARY ANGIOGRAPHY N/A  08/07/2020   Procedure: RIGHT/LEFT HEART CATH AND CORONARY ANGIOGRAPHY;  Surgeon: Swaziland, Peter M, MD;  Location: [facility] INVASIVE CV LAB;  Service: Cardiovascular;  Laterality: N/A;   SOFT TISSUE MASS EXCISION     Right arm, Left leg  for MRSA infection   Patient Active Problem List   Diagnosis Date Noted   ACS (acute coronary syndrome) (HCC) 09/12/2021   Prolonged QT interval 09/12/2021   Unstable angina (HCC) 08/20/2021   Nuclear sclerotic cataract of right eye 06/11/2021   Nuclear sclerotic cataract of left eye 06/11/2021   Posterior subcapsular age-related cataract, left eye 06/11/2021   Stable treated proliferative diabetic retinopathy of left eye determined by examination associated with type 2 diabetes mellitus (HCC) 06/11/2021   Right epiretinal membrane 06/11/2021   Stable treated proliferative diabetic retinopathy of right eye determined by examination associated with type 2 diabetes mellitus (HCC) 06/11/2021    Hyperkalemia 01/09/2021   ESRD (end stage renal disease) (HCC)    Dialysis AV fistula malfunction, initial encounter (HCC) 01/07/2021   NSTEMI (non-ST elevated myocardial infarction) (HCC) 09/21/2020   3-vessel coronary artery disease 08/09/2020   Pulmonary hypertension, unspecified (HCC) 08/09/2020   Pancytopenia (HCC) 08/06/2020   Allergy, unspecified, subsequent encounter 06/28/2020   Anaphylactic shock, unspecified, subsequent encounter 06/28/2020   Lymphedema of both lower extremities 12/29/2019   Hyperphosphatemia 12/11/2019   Encounter for post surgical wound check 08/16/2019   Post-op pain 08/16/2019   S/P transmetatarsal amputation of foot, right (HCC) 08/16/2019   Unspecified protein-calorie malnutrition (HCC) 07/12/2019   PAD (peripheral artery disease) (HCC) 06/30/2019   Diabetic retinopathy associated with type 2 diabetes mellitus (HCC) 06/23/2019   Other forms of retinal detachment(361.89) 06/23/2019   Secondary hyperparathyroidism (HCC) 06/23/2019   Osteomyelitis, unspecified (HCC) 05/31/2019   Gangrene of toe of right foot (HCC)    Ulcer of right foot, with necrosis of bone (HCC)    Abscess of right foot    Diabetic infection of right foot (HCC)    Ulcer of right second toe with necrosis of bone (HCC)    Cellulitis of right foot 05/22/2019   Acute osteomyelitis of right foot (HCC) 05/15/2019   Osteomyelitis of great toe of right foot (HCC) 05/14/2019   Diabetic foot ulcer- right second toe 05/14/2019   Sepsis (HCC) 05/14/2019   Hypothyroidism, unspecified 01/11/2018   Hypercalcemia 06/14/2016   Dyslipidemia 05/11/2015   Hyperglycemia 05/05/2015   Diarrhea, unspecified 02/01/2015   Fever, unspecified 02/01/2015   Pain, unspecified 02/01/2015   Pruritus, unspecified 02/01/2015   Coagulation defect, unspecified (HCC) 07/07/2014   Encounter for removal of sutures 05/30/2014   Encounter for immunization 04/19/2014   Chest pain 08/23/2013   Type 2 diabetes  mellitus (HCC) 08/23/2013   Hemodialysis status (HCC) 08/23/2013   Chronic kidney disease, stage V (HCC) 08/23/2013   Obesity, unspecified 08/23/2013   Complication of vascular dialysis catheter 12/15/2012   Other fluid overload 12/07/2012   Other specified complication of vascular prosthetic devices, implants and grafts, subsequent encounter 08/06/2012   ESRD on dialysis (HCC) 07/18/2011   Pure hypercholesterolemia 06/23/2011   Iron deficiency anemia, unspecified 06/21/2011   HYPOGLYCEMIA, UNSPECIFIED 11/13/2008   RENAL INSUFFICIENCY 07/31/2008   FOOT ULCER, LEFT 06/29/2008   HYPERLIPIDEMIA 05/16/2008   MRSA INFECTION 10/18/2007   Essential hypertension 10/18/2007    ONSET DATE: 02/12/2023 prosthesis delivery  REFERRING DIAG: 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  His current blood glucose is 104 and he has not eaten since Sunday (today is Tuesday).   PERTINENT HISTORY: left BKA, right TMA, DM2, PAD, CAD, MI, ESRD on HD, moderate AS, HTN, hypothyroidism, anemia, CHF  PAIN:  Are you having pain?   Yes: NPRS scale: this morning 0/10 Pain location: low back, muscles of both LEs Pain description: dull ache, sciatica pain Aggravating factors: walking with lean,  Relieving factors: exercises  Residual limb pain 0/10 mainly at distal tibia  PRECAUTIONS: Fall and Other: No BP RUE  WEIGHT BEARING RESTRICTIONS: No  FALLS: Has patient fallen in last 6 months? No  LIVING ENVIRONMENT: Lives with: lives with their spouse Lives in: House Home Access: Ramped entrance Home layout: Two level, 1/2 bath on main  level, and stair lift Stairs: Yes: Internal: 15 steps; on left going up and External: 2-5 steps; can reach both Has following equipment at home: Environmental consultant - 2 wheeled, Wheelchair (manual), [employer], Grab bars, and Ramped entry  PLOF: Independent with household mobility without device and Independent with community mobility without device  PATIENT GOALS:   to use prosthesis to be active including gardening, his yard has hill,   OBJECTIVE:  COGNITION: Eval on 02/17/23:   Overall cognitive status: Within functional limits for tasks assessed   SENSATION: WFL  POSTURE:  Eval / 02/17/2023:  rounded shoulders, forward head, flexed trunk , and weight shift right  LOWER EXTREMITY ROM:  ROM P:passive  A:active Right eval Left eval  Hip flexion    Hip extension Standing -15* Standing  -15*  Hip abduction    Hip adduction    Hip internal rotation    Hip external rotation    Knee flexion    Knee extension    Ankle dorsiflexion    Ankle plantarflexion    Ankle inversion    Ankle eversion     (Blank rows = not tested)  LOWER EXTREMITY MMT:  MMT Right eval Left eval  Hip flexion    Hip extension Functional 4/5 Functional 4/5  Hip abduction Functional 4/5 Functional 4/5  Hip adduction    Hip internal rotation    Hip external rotation    Knee flexion Functional 4/5 Functional 4/5  Knee extension Functional 4/5 Functional 4/5  Ankle dorsiflexion 3-/5 NA  Ankle plantarflexion  NA  Ankle inversion  NA  Ankle eversion  NA  (Blank rows = not tested)  TRANSFERS: Eval / 02/17/2023: Sit to stand: SBA requires use of armrests from 18" chair using back of legs against chair & RW to stabilize Stand to sit: SBA requires use of armrests from 18" chair using back of legs against chair & RW to stabilize  GAIT: 03/26/2023: with cane Gait Velocity 2.13 ft/sec comfortable self-selected pace with SBA & 2.60 ft/sec fast pace with CGA Functional Gait Assessment with cane:  [facility] PT  Assessment - 05/12/23 1015       Standardized Balance Assessment   Standardized Balance Assessment Berg Balance Test;Timed Up and Go Test      Berg Balance Test   Sit to Stand Able to stand  independently using hands    Standing Unsupported Able to stand safely 2 minutes    Sitting with Back Unsupported but Feet Supported on Floor or Stool Able  to sit safely and securely 2 minutes    Stand to Sit Controls descent by using hands    Transfers Able to transfer safely, minor use of hands    Standing Unsupported with Eyes Closed Unable to keep eyes closed 3 seconds but stays steady    Standing Unsupported with Feet Together Able to place feet together independently but unable to hold for 30 seconds    From Standing, Reach Forward with Outstretched Arm Can reach forward >5 cm safely (2")    From Standing Position, Pick up Object from Floor Able to pick up shoe safely and easily    From Standing Position, Turn to Look Behind Over each Shoulder Turn sideways only but maintains balance    Turn 360 Degrees Able to turn 360 degrees safely but slowly    Standing Unsupported, Alternately Place Feet on Step/Stool Able to complete >2 steps/needs minimal assist    Standing Unsupported, One Foot in Front Needs help to step but can hold 15 seconds    Standing on One Leg Tries to lift leg/unable to hold 3 seconds but remains standing independently    Total Score 34              Eval / 02/17/2023: Gait pattern: step to pattern, decreased step length- Right, decreased stance time- Left, decreased hip/knee flexion- Left, decreased ankle dorsiflexion- Right, Left hip hike, Right foot flat, genu recurvatum- Left, antalgic, trendelenburg, lateral hip instability, trunk flexed, abducted- Left, and poor foot clearance- Right excessive BUE weight bearing on RW Distance walked: 100' Assistive device utilized: Environmental consultant - 2 wheeled and TTA prosthesis Level of assistance: SBA  FUNCTIONAL TESTs:  05/12/2023: Sharlene Motts  Balance Scale 34/56  [facility] PT Assessment - 05/12/23 1015       Standardized Balance Assessment   Standardized Balance Assessment Berg Balance Test;Timed Up and Go Test      Berg Balance Test   Sit to Stand Able to stand  independently using hands    Standing Unsupported Able to stand safely 2 minutes    Sitting with Back Unsupported but Feet Supported on Floor or Stool Able to sit safely and securely 2 minutes    Stand to Sit Controls descent by using hands    Transfers Able to transfer safely, minor use of hands    Standing Unsupported with Eyes Closed Unable to keep eyes closed 3 seconds but stays steady    Standing Unsupported with Feet Together Able to place feet together independently but unable to hold for 30 seconds    From Standing, Reach Forward with Outstretched Arm Can reach forward >5 cm safely (2")    From Standing Position, Pick up Object from Floor Able to pick up shoe safely and easily    From Standing Position, Turn to Look Behind Over each Shoulder Turn sideways only but maintains balance    Turn 360 Degrees Able to turn 360 degrees safely but slowly    Standing Unsupported, Alternately Place Feet on Step/Stool Able to complete >2 steps/needs minimal assist    Standing Unsupported, One Foot in Front Needs help to step but can hold 15 seconds    Standing on One Leg Tries to lift leg/unable to hold 3 seconds but remains standing independently    Total Score 34              Eval / 02/17/2023:   Sharlene Motts Balance Scale: 11/56   CURRENT PROSTHETIC WEAR ASSESSMENT: 05/12/2023: Pt is independent in prosthetic care including dialysis  weight issues, skin check, residual limb care, care of non-amputated limb, prosthetic cleaning, ply sock cleaning, correct ply sock adjustment, proper wear schedule/adjustment, and proper weight-bearing schedule/adjustment. He tolerates prosthesis wear all awake hours without skin or limb pain issues.   05/07/2023: Residual limb has not open  areas. One 1mm circle on incision that could be internal suture pushing itself out. Pt has + tinel for neuroma.   03/25/2023: pt reports wearing prosthesis all awake hours including to dialysis with some distal tibia pain.  He verbalizes how to adjust ply socks, properly donnes and reports proper cleaning.   Eval /  02/17/2023: Patient is dependent with: skin check, residual limb care, care of non-amputated limb, prosthetic cleaning, ply sock cleaning, correct ply sock adjustment, proper wear schedule/adjustment, and proper weight-bearing schedule/adjustment Donning prosthesis: SBA Doffing prosthesis: SBA Prosthetic wear tolerance: 4 hours, 1x/day, 4 of 6 days since delivery Prosthetic weight bearing tolerance: 5 minutes with limb pain 8/10 Edema: pitting with 5 sec capillary refill Residual limb condition: cylindrical shape, no open areas, limited to no hair growth, scar dryness, normal color & temperature Prosthetic description: silicon liner with pin lock suspension, total contact socket with flexible inner socket, dynamic response foot K code/activity level with prosthetic use: Level 3    TODAY'S TREATMENT:                                                                                                                             DATE:  05/12/2023: Prosthetic Training with Transtibial Amputation: See objective data  Therapeutic Exercise: PT reviewed well-rounded fitness plan to include exercises for flexibility, strength, endurance & balance. Pt verbalized understanding.   Neuromuscular Re-education: See Sharlene Motts under objective data.  PT reviewed results and indications which pt verbalized understanding.    TREATMENT:                                                                                                                             DATE:  05/07/2023: Prosthetic Training with Transtibial Amputation: PT verbal cues on neuroma and possible management options.  PT educated verbally on  wife pulling internal incision if it comes out far enough to grasp with tweezers. PT advised to fully remove any lotion from limb prior to donning prosthesis. Pt verbalized understanding.   Therapeutic Exercise:  Pt plans to join a gym in the near future. PT educated pt on safety and proper use of  equipment with TTA prosthesis. PT demo & verbal cues and pt return demo & verbalized understanding.  -recumbent bike including set-up -treadmill safety. Pt amb at 1.3 mph for 2 min.  -leg press BLEs 100# 10 reps; single leg with BLES 50# 5 reps ea. -UE resistance machines with pelvis level by positioning prosthesis  seated chest press BUEs 15# 5 reps -knee ext machine BLEs 15# 10 reps -knee flex machine BLEs 25# 15 reps     TREATMENT:                                                                                                                             DATE:  05/05/2023: Prosthetic Training with Transtibial Amputation: Pt reports limb itching. PT recommended to try to tap socket first to see if vibration overrides the sensation. If he removes prosthesis & liner, then use either wet washcloth or dry material like prosthetic sock to decrease risk of breaking the skin with fingernails.  He can use hydrocortisone cream at night.  Make sure he thoroughly rinses liner & shrinker as soap residual can cause itching. Pt verbalized understanding.  Pt amb 75' without device with supervision.  PT recommended in house where limited distances and no barriers/ramps/curbs. Pt verbalized understanding.  PT demo & verbal cues on lifting & carrying box.  Pt return demo with light empty bin. Chair behind him for safely. Pt able to carry it 25' X 2 with supervision.  Pt amb 5' with cane carrying 10# kettle bell with supervision. PT demo & verbal cues on offset stance with weight shift between feet: moving 10# kettle bell to back & to front of counter, simulated vacuuming and simulated raking. Pt return demo  understanding with CGA. PT demo & verbal cues on use of garden kneel bench. Pt verbalized understanding.    HOME EXERCISE PROGRAM: Access Code: [email or web address] URL: [email or web address] Date: 04/21/2023 Prepared by: Vladimir Faster  Exercises - standing calf stretch with forefoot on small step or brick  - 2 x daily - 7 x weekly - 1 sets - 3 reps - 30 seconds hold - Standing Soleus Stretch on Foam 1/2 Roll  - 2 x daily - 7 x weekly - 1 sets - 13 reps - 30 seconds hold - Upright Stance at Door Frame Single Arm  - 1-3 x daily - 7 x weekly - 1 sets - 2 reps - 2 deep breathes hold - Upright Stance at Door Frame with Both Arms  - 1-3 x daily - 7 x weekly - 1 sets - 2 reps - 2 deep breathes hold - Standing posture with back to counter  - 1 x daily - 5 x weekly - 1 sets - 10 reps - 5 seconds hold - Walk with Head Turns  - 1 x daily - 7 x weekly - 1 sets - 10 reps - Walking with Eyes Closed  - 1 x daily - 7 x weekly - 1 sets -  10 reps - Standing Hip Flexion with Resistance (Mirrored)  - 1 x daily - 7 x weekly - 1 sets - 10 reps - Standing Hip Adduction with Resistance (Mirrored)  - 1 x daily - 7 x weekly - 1 sets - 10 reps - Standing Hip Extension with Resistance  - 1 x daily - 7 x weekly - 1 sets - 10 reps - Standing Hip Abduction with Theraband Resistance  - 1 x daily - 7 x weekly - 1 sets - 10 reps - standing knee extension  - 1 x daily - 7 x weekly - 1 sets - 10 reps - 5 seconds hold - Seated Table Hamstring Stretch long sit with strap assist- 1-2 x daily - 7 x weekly - 1 sets - 3 reps - 30 seconds hold - Hip Flexor Stretch at Edge of Bed  - 1-2 x daily - 7 x weekly - 1 sets - 3 reps - 30 seconds hold - Supine Quadriceps Stretch with Strap on Table  - 1-2 x daily - 7 x weekly - 1 sets - 3 reps - 30 seconds hold  ASSESSMENT: CLINICAL IMPRESSION: Pt met 3 LTGs checked today.  Berg Balance improved 23 points (11/56 to 34/56) indicating decrease in fall risk but his fall risk remains high  as score is <45/56. PT ended session early as pt not feeling well.   OBJECTIVE IMPAIRMENTS: Abnormal gait, decreased activity tolerance, decreased balance, decreased knowledge of condition, decreased knowledge of use of DME, decreased mobility, difficulty walking, decreased strength, increased edema, impaired flexibility, postural dysfunction, prosthetic dependency , and pain.   ACTIVITY LIMITATIONS: carrying, lifting, bending, standing, squatting, stairs, transfers, and locomotion level  PARTICIPATION LIMITATIONS: meal prep, cleaning, driving, community activity, and yard work  PERSONAL FACTORS: Education, Fitness, Past/current experiences, Time since onset of injury/illness/exacerbation, and 3+ comorbidities: see PMH  are also affecting patient's functional outcome.   REHAB POTENTIAL: Good  CLINICAL DECISION MAKING: Evolving/moderate complexity  EVALUATION COMPLEXITY: Moderate   GOALS: Goals reviewed with patient? Yes  SHORT TERM GOALS: Target date: 03/19/2023  Patient donnes prosthesis modified independent & verbalizes proper cleaning. Baseline: SEE OBJECTIVE DATA Goal status:  MET 03/24/2023 2.  Patient tolerates prosthesis >12 hrs total /day without skin issues or limb pain <5/10 after standing. Baseline: SEE OBJECTIVE DATA Goal status: MET 03/24/2023  3.  Patient able to stand 30 seconds without UE support with supervision. Baseline: SEE OBJECTIVE DATA Goal status: MET 03/24/2023  4. Patient ambulates 87' with RW & prosthesis with supervision. Baseline: SEE OBJECTIVE DATA Goal status: MET 03/24/2023  5. Patient negotiates ramps & curbs with RW & prosthesis with supervision. Baseline: SEE OBJECTIVE DATA Goal status: MET 03/24/2023  LONG TERM GOALS: Target date: 05/14/2023  Patient demonstrates & verbalized understanding of prosthetic care to enable safe utilization of prosthesis. Baseline: SEE OBJECTIVE DATA Goal status: MET 05/12/2023  Patient tolerates prosthesis wear >90%  of awake hours without skin or limb pain issues. Baseline: SEE OBJECTIVE DATA Goal status: MET 05/12/2023  Berg Balance >/= 30 / 56 to indicate lower fall risk Baseline: SEE OBJECTIVE DATA Goal status: MET 05/12/2023  Patient ambulates >500' with prosthesis & LRAD independently Baseline: SEE OBJECTIVE DATA Goal status: 04/14/2023  Patient negotiates ramps, curbs & stairs with single rail with prosthesis & LRAD independently. Baseline: SEE OBJECTIVE DATA Goal status: 04/14/2023   PLAN:  PT FREQUENCY: 2x/week  PT DURATION: 12 weeks  PLANNED INTERVENTIONS: Therapeutic exercises, Therapeutic activity, Neuromuscular re-education, Balance training, Gait training, Patient/Family  education, Self Care, Stair training, Prosthetic training, DME instructions, Re-evaluation, and physical performance testing  PLAN FOR NEXT SESSION: check remaining 2 LTGs with plan to discharge.   Vladimir Faster, PT, DPT 05/12/2023, 10:49 AM

## 2023-05-12 NOTE — Patient Instructions (Signed)
Visit Information  Thank you for taking time to visit with me today. Please don't hesitate to contact me if I can be of assistance to you.   Following are the goals we discussed today:   Goals Addressed               This Visit's Progress     Patient Stated he is concerned about finances. sometimes it is hard to pay monthly bills (pt-stated)        Interventions:  LCSW spoke via phone today  with [name] about his current status and needs. Reviewed medication procurement.  Discussed pain issues of client Discussed physical therapy sessions client is receiving. He is enjoying physical therapy sessions he receives. He said he is almost done with physical therapy sessions. He said he has one more session left to receive in physical therapy [name] said he thought he was doing well. He continues to use ramp to help him go in and out of home. He has support of his spouse, [name] Encouraged client to call LCSW for SW support as needed at [phone]         Our next appointment is by telephone on 06/30/23 at 9:30 AM    Please call the care guide team at [phone] if you need to cancel or reschedule your appointment.   If you are experiencing a Mental Health or Behavioral Health Crisis or need someone to talk to, please go to [facility] [address], [place] [phone])   The patient verbalized understanding of instructions, educational materials, and care plan provided today and DECLINED offer to receive copy of patient instructions, educational materials, and care plan.   The patient has been provided with contact information for the care management team and has been advised to call with any health related questions or concerns.   Kelton Pillar.Kameran Lallier MSW, LCSW Licensed Visual merchandiser [facility] Care Management [phone]

## 2023-05-12 NOTE — Patient Outreach (Signed)
  Care Coordination   Follow Up Visit Note   05/12/2023 Name: [name] MRN: [id number] DOB: [birth year]  [name] is a 57 y.o. year old male who sees [name] Contras, MD for primary care. I spoke with  [name] by phone today.  What matters to the patients health and wellness today? Patient is concerned about finances. Sometimes it is difficult for him to pay monthly bills    Goals Addressed               This Visit's Progress     Patient Stated he is concerned about finances. sometimes it is hard to pay monthly bills (pt-stated)        Interventions:  LCSW spoke via phone today  with [name] about his current status and needs. Reviewed medication procurement.  Discussed pain issues of client Discussed physical therapy sessions client is receiving. He is enjoying physical therapy sessions he receives. He said he is almost done with physical therapy sessions. He said he has one more session left to receive in physical therapy [name] said he thought he was doing well. He continues to use ramp to help him go in and out of home. He has support of his spouse, [name] Encouraged client to call LCSW for SW support as needed at [phone]         SDOH assessments and interventions completed:  Yes  SDOH Interventions Today    Flowsheet Row Most Recent Value  SDOH Interventions   Depression Interventions/Treatment  Counseling  Physical Activity Interventions Other (Comments)  [currently receiving physical therapy sessions as scheduled]  Stress Interventions Provide Counseling  [client has stress related to financial challenges]        Care Coordination Interventions:  Yes, provided   Interventions Today    Flowsheet Row Most Recent Value  Chronic Disease   Chronic disease during today's visit Other  [spoke with client about client needs]  General Interventions   General Interventions Discussed/Reviewed General Interventions Discussed, Community  Resources  [reviewed program support]  Exercise Interventions   Exercise Discussed/Reviewed Physical Activity  Education Interventions   Education Provided Provided Education  Provided Verbal Education On [employer]  Mental Health Interventions   Mental Health Discussed/Reviewed Coping Strategies  [client has stress in managing medical needs]  Pharmacy Interventions   Pharmacy Dicussed/Reviewed Pharmacy Topics Discussed        Follow up plan: Follow up call scheduled for 06/30/23 at 9:30 AM    Encounter Outcome:  Pt. Visit Completed   Kelton Pillar.Krystelle Prashad MSW, LCSW Licensed Visual merchandiser [facility] Care Management [phone]

## 2023-05-14 ENCOUNTER — Encounter: Payer: Self-pay | Admitting: Physical Therapy

## 2023-05-14 ENCOUNTER — Ambulatory Visit (INDEPENDENT_AMBULATORY_CARE_PROVIDER_SITE_OTHER): Payer: Medicare Other | Admitting: Physical Therapy

## 2023-05-14 DIAGNOSIS — R2689 Other abnormalities of gait and mobility: Secondary | ICD-10-CM

## 2023-05-14 DIAGNOSIS — M6281 Muscle weakness (generalized): Secondary | ICD-10-CM

## 2023-05-14 DIAGNOSIS — M79662 Pain in left lower leg: Secondary | ICD-10-CM

## 2023-05-14 DIAGNOSIS — R2681 Unsteadiness on feet: Secondary | ICD-10-CM

## 2023-05-14 NOTE — Therapy (Signed)
OUTPATIENT PHYSICAL THERAPY PROSTHETICS TREATMENT& DISCHARGE SUMMARY   Patient Name: [name] MRN: [id number] DOB:06/21/1966, 57 y.o., male Today's Date: 05/14/2023  PCP: [name], MD REFERRING PROVIDER: Marcello Fennel, MD  PHYSICAL THERAPY DISCHARGE SUMMARY  Visits from Start of Care: 90  Current functional level related to goals / functional outcomes: See below   Remaining deficits: See below   Education / Equipment: Patient was instructed in prosthetic care and ongoing HEP which he appears to understand.    Patient agrees to discharge. Patient goals were met. Patient is being discharged due to meeting the stated rehab goals.   END OF SESSION:  PT End of Session - 05/14/23 1016     Visit Number 19    Number of Visits 24    Date for PT Re-Evaluation 05/14/23    Authorization Type Medicare & BCBS    Progress Note Due on Visit 20    PT Start Time 1015    PT Stop Time 1042    PT Time Calculation (min) 27 min    Equipment Utilized During Treatment Gait belt    Activity Tolerance Patient tolerated treatment well;Patient limited by pain    Behavior During Therapy [facility] for tasks assessed/performed                       Past Medical History:  Diagnosis Date   Acute osteomyelitis of right foot (HCC) 05/15/2019   Anemia 06/23/2019   Aortic stenosis    Bursitis    right shoulder   Complication of anesthesia    Coronary artery disease    Diabetes with renal manifestations(250.4) 08/23/2013   Diabetic infection of right foot (HCC)    Diabetic retinopathy associated with type 2 diabetes mellitus (HCC) 06/23/2019   ESRD on hemodialysis (HCC) 07/18/2011   Essential hypertension 10/18/2007   Qualifier: Diagnosis of  By: Everardo All MD, Gregary Signs A    History of blood transfusion    HYPERLIPIDEMIA 05/16/2008   Qualifier: Diagnosis of  By: Jonny Ruiz MD, Len Blalock    MRSA INFECTION 10/18/2007   Qualifier: Diagnosis of  By: Tyrone Apple, Lucy     Other forms of  retinal detachment(361.89) 06/23/2019   PAD (peripheral artery disease) (HCC)    PONV (postoperative nausea and vomiting)    Prolonged QT interval    Pulmonary hypertension (HCC)    Secondary hyperparathyroidism (HCC) 06/23/2019   Sepsis (HCC) 05/14/2019   Past Surgical History:  Procedure Laterality Date   ABDOMINAL AORTOGRAM N/A 05/17/2019   Procedure: ABDOMINAL AORTOGRAM;  Surgeon: Sherren Kerns, MD;  Location: [facility] INVASIVE CV LAB;  Service: Cardiovascular;  Laterality: N/A;   AMPUTATION TOE Right 05/14/2019   Procedure: PARTIAL AMPUTATION SECOND TOE RIGHT FOOT;  Surgeon: Park Liter, DPM;  Location: [facility] OR;  Service: Podiatry;  Laterality: Right;   AMPUTATION TOE Right 05/23/2019   Procedure: AMPUTATION OF SECOND TOE METATARSAL PHALANGEAL JOINT;  Surgeon: Park Liter, DPM;  Location: [facility] OR;  Service: Podiatry;  Laterality: Right;   AMPUTATION TOE Right 05/27/2019   Procedure: Amputation Toe;  Surgeon: Park Liter, DPM;  Location: [facility] OR;  Service: Podiatry;  Laterality: Right;  right third toe   APPLICATION OF WOUND VAC Right 05/27/2019   Procedure: Application Of Wound Vac;  Surgeon: Park Liter, DPM;  Location: [facility] OR;  Service: Podiatry;  Laterality: Right;   AV FISTULA PLACEMENT  06-18-11   Right brachiocephalic AVF   BONE BIOPSY Right 05/14/2019  Procedure: OPEN SUPERFICIAL BONE BIOPSY GREAT TOE;  Surgeon: Park Liter, DPM;  Location: [facility] OR;  Service: Podiatry;  Laterality: Right;   CARDIAC CATHETERIZATION     CORONARY BALLOON ANGIOPLASTY N/A 09/24/2020   Procedure: CORONARY BALLOON ANGIOPLASTY;  Surgeon: Swaziland, Peter M, MD;  Location: [facility] INVASIVE CV LAB;  Service: Cardiovascular;  Laterality: N/A;   CORONARY PRESSURE/FFR STUDY N/A 09/24/2020   Procedure: INTRAVASCULAR PRESSURE WIRE/FFR STUDY;  Surgeon: Swaziland, Peter M, MD;  Location: [facility] INVASIVE CV LAB;  Service: Cardiovascular;  Laterality: N/A;   CORONARY STENT INTERVENTION N/A 09/24/2020   Procedure: CORONARY  STENT INTERVENTION;  Surgeon: Swaziland, Peter M, MD;  Location: [facility] INVASIVE CV LAB;  Service: Cardiovascular;  Laterality: N/A;   EYE SURGERY     left eye for Laser, diabetic retinopathy   FISTULOGRAM Right 11/06/2011   Procedure: FISTULOGRAM;  Surgeon: Fransisco Hertz, MD;  Location: [facility] CATH LAB;  Service: Cardiovascular;  Laterality: Right;   HEMATOMA EVACUATION Right Feb. 25, 2014   HEMATOMA EVACUATION Right 12/14/2012   Procedure: EVACUATION HEMATOMA;  Surgeon: Fransisco Hertz, MD;  Location: [facility] OR;  Service: Vascular;  Laterality: Right;   INCISION AND DRAINAGE Right 05/23/2019   Procedure: INCISION AND DRAINAGE OF RIGHT FOOT DEEP SPACE ABSCESS;  Surgeon: Park Liter, DPM;  Location: [facility] OR;  Service: Podiatry;  Laterality: Right;   INJECTION OF SILICONE OIL Left 01/09/2023   Procedure: INJECTION OF SILICONE OIL;  Surgeon: Edmon Crape, MD;  Location: [facility] OR;  Service: Ophthalmology;  Laterality: Left;   INSERTION OF DIALYSIS CATHETER  12/14/2012   Procedure: INSERTION OF DIALYSIS CATHETER;  Surgeon: Fransisco Hertz, MD;  Location: [facility] OR;  Service: Vascular;;   INSERTION OF DIALYSIS CATHETER Right 01/07/2021   Procedure: INSERTION OF DIALYSIS CATHETER;  Surgeon: Sherren Kerns, MD;  Location: [facility] OR;  Service: Vascular;  Laterality: Right;   INSERTION OF DIALYSIS CATHETER Left 01/08/2021   Procedure: INSERTION OF LEFT INTERNAL JUGULAR DIALYSIS CATHETER, tunneled;  Surgeon: Chuck Hint, MD;  Location: [facility] OR;  Service: Vascular;  Laterality: Left;   IRRIGATION AND DEBRIDEMENT FOOT Right 05/25/2019   Procedure: INCISION AND DRAINAGE FOOT;  Surgeon: Park Liter, DPM;  Location: [facility] OR;  Service: Podiatry;  Laterality: Right;   IRRIGATION AND DEBRIDEMENT FOOT Right 05/27/2019   Procedure: IRRIGATION AND DEBRIDEMENT FOOT;  Surgeon: Park Liter, DPM;  Location: [facility] OR;  Service: Podiatry;  Laterality: Right;   LASER PHOTO ABLATION Left 01/09/2023   Procedure: LASER PHOTO ABLATION;  Surgeon:  Edmon Crape, MD;  Location: [facility] OR;  Service: Ophthalmology;  Laterality: Left;   LEFT HEART CATH AND CORONARY ANGIOGRAPHY N/A 09/24/2020   Procedure: LEFT HEART CATH AND CORONARY ANGIOGRAPHY;  Surgeon: Swaziland, Peter M, MD;  Location: [facility] INVASIVE CV LAB;  Service: Cardiovascular;  Laterality: N/A;   LOWER EXTREMITY ANGIOGRAPHY Bilateral 05/17/2019   Procedure: LOWER EXTREMITY ANGIOGRAPHY;  Surgeon: Sherren Kerns, MD;  Location: [facility] INVASIVE CV LAB;  Service: Cardiovascular;  Laterality: Bilateral;   PARS PLANA VITRECTOMY Left 01/09/2023   Procedure: PARS PLANA VITRECTOMY WITH 25 GAUGE AND REPAIR OF RETINAL DETACHMENT;  Surgeon: Edmon Crape, MD;  Location: [facility] OR;  Service: Ophthalmology;  Laterality: Left;   REMOVAL OF A DIALYSIS CATHETER Right 01/08/2021   Procedure: REMOVAL OF TEMPORARY DIALYSIS CATHETER, LEFT FEMORAL VEIN;  Surgeon: Chuck Hint, MD;  Location: [facility] OR;  Service: Vascular;  Laterality: Right;   REVISON OF ARTERIOVENOUS FISTULA Right 12/14/2012  Procedure: REVISON OF upper arm ARTERIOVENOUS FISTULA using 66mmx10cm gortex graft;  Surgeon: Fransisco Hertz, MD;  Location: [facility] OR;  Service: Vascular;  Laterality: Right;   REVISON OF ARTERIOVENOUS FISTULA Right 12/27/2020   Procedure: RIGHT UPPER EXTREMITY ARTERIOVENOUS FISTULA REVISiON;  Surgeon: Leonie Douglas, MD;  Location: [facility] OR;  Service: Vascular;  Laterality: Right;  PERIPHERAL NERVE BLOCK   RIGHT/LEFT HEART CATH AND CORONARY ANGIOGRAPHY N/A 08/07/2020   Procedure: RIGHT/LEFT HEART CATH AND CORONARY ANGIOGRAPHY;  Surgeon: Swaziland, Peter M, MD;  Location: [facility] INVASIVE CV LAB;  Service: Cardiovascular;  Laterality: N/A;   SOFT TISSUE MASS EXCISION     Right arm, Left leg  for MRSA infection   Patient Active Problem List   Diagnosis Date Noted   ACS (acute coronary syndrome) (HCC) 09/12/2021   Prolonged QT interval 09/12/2021   Unstable angina (HCC) 08/20/2021   Nuclear sclerotic cataract of right eye 06/11/2021    Nuclear sclerotic cataract of left eye 06/11/2021   Posterior subcapsular age-related cataract, left eye 06/11/2021   Stable treated proliferative diabetic retinopathy of left eye determined by examination associated with type 2 diabetes mellitus (HCC) 06/11/2021   Right epiretinal membrane 06/11/2021   Stable treated proliferative diabetic retinopathy of right eye determined by examination associated with type 2 diabetes mellitus (HCC) 06/11/2021   Hyperkalemia 01/09/2021   ESRD (end stage renal disease) (HCC)    Dialysis AV fistula malfunction, initial encounter (HCC) 01/07/2021   NSTEMI (non-ST elevated myocardial infarction) (HCC) 09/21/2020   3-vessel coronary artery disease 08/09/2020   Pulmonary hypertension, unspecified (HCC) 08/09/2020   Pancytopenia (HCC) 08/06/2020   Allergy, unspecified, subsequent encounter 06/28/2020   Anaphylactic shock, unspecified, subsequent encounter 06/28/2020   Lymphedema of both lower extremities 12/29/2019   Hyperphosphatemia 12/11/2019   Encounter for post surgical wound check 08/16/2019   Post-op pain 08/16/2019   S/P transmetatarsal amputation of foot, right (HCC) 08/16/2019   Unspecified protein-calorie malnutrition (HCC) 07/12/2019   PAD (peripheral artery disease) (HCC) 06/30/2019   Diabetic retinopathy associated with type 2 diabetes mellitus (HCC) 06/23/2019   Other forms of retinal detachment(361.89) 06/23/2019   Secondary hyperparathyroidism (HCC) 06/23/2019   Osteomyelitis, unspecified (HCC) 05/31/2019   Gangrene of toe of right foot (HCC)    Ulcer of right foot, with necrosis of bone (HCC)    Abscess of right foot    Diabetic infection of right foot (HCC)    Ulcer of right second toe with necrosis of bone (HCC)    Cellulitis of right foot 05/22/2019   Acute osteomyelitis of right foot (HCC) 05/15/2019   Osteomyelitis of great toe of right foot (HCC) 05/14/2019   Diabetic foot ulcer- right second toe 05/14/2019   Sepsis (HCC)  05/14/2019   Hypothyroidism, unspecified 01/11/2018   Hypercalcemia 06/14/2016   Dyslipidemia 05/11/2015   Hyperglycemia 05/05/2015   Diarrhea, unspecified 02/01/2015   Fever, unspecified 02/01/2015   Pain, unspecified 02/01/2015   Pruritus, unspecified 02/01/2015   Coagulation defect, unspecified (HCC) 07/07/2014   Encounter for removal of sutures 05/30/2014   Encounter for immunization 04/19/2014   Chest pain 08/23/2013   Type 2 diabetes mellitus (HCC) 08/23/2013   Hemodialysis status (HCC) 08/23/2013   Chronic kidney disease, stage V (HCC) 08/23/2013   Obesity, unspecified 08/23/2013   Complication of vascular dialysis catheter 12/15/2012   Other fluid overload 12/07/2012   Other specified complication of vascular prosthetic devices, implants and grafts, subsequent encounter 08/06/2012   ESRD on dialysis (HCC) 07/18/2011   Pure hypercholesterolemia 06/23/2011  Iron deficiency anemia, unspecified 06/21/2011   HYPOGLYCEMIA, UNSPECIFIED 11/13/2008   RENAL INSUFFICIENCY 07/31/2008   FOOT ULCER, LEFT 06/29/2008   HYPERLIPIDEMIA 05/16/2008   MRSA INFECTION 10/18/2007   Essential hypertension 10/18/2007    ONSET DATE: 02/12/2023 prosthesis delivery  REFERRING DIAG: 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    PERTINENT HISTORY: left BKA, right TMA, DM2, PAD, CAD, MI, ESRD on HD, moderate AS, HTN, hypothyroidism, anemia, CHF  PAIN:  Are you having pain?   Yes: NPRS scale: this morning 0/10 Pain location: low back, muscles of both LEs Pain description: dull ache, sciatica  pain Aggravating factors: walking with lean,  Relieving factors: exercises  Residual limb pain 0/10 mainly at distal tibia  PRECAUTIONS: Fall and Other: No BP RUE  WEIGHT BEARING RESTRICTIONS: No  FALLS: Has patient fallen in last 6 months? No  LIVING ENVIRONMENT: Lives with: lives with their spouse Lives in: House Home Access: Ramped entrance Home layout: Two level, 1/2 bath on main level, and stair lift Stairs: Yes: Internal: 15 steps; on left going up and External: 2-5 steps; can reach both Has following equipment at home: Environmental consultant - 2 wheeled, Wheelchair (manual), [employer], Grab bars, and Ramped entry  PLOF: Independent with household mobility without device and Independent with community mobility without device  PATIENT GOALS:   to use prosthesis to be active including gardening, his yard has hill,   OBJECTIVE:  COGNITION: Eval on 02/17/23:   Overall cognitive status: Within functional limits for tasks assessed   SENSATION: WFL  POSTURE:  Eval / 02/17/2023:  rounded shoulders, forward head, flexed trunk , and weight shift right  LOWER EXTREMITY ROM:  ROM P:passive  A:active Right eval Left eval  Hip flexion    Hip extension Standing -15* Standing  -15*  Hip abduction    Hip adduction    Hip internal rotation    Hip external rotation    Knee flexion    Knee extension    Ankle dorsiflexion    Ankle plantarflexion    Ankle inversion    Ankle eversion     (Blank rows = not tested)  LOWER EXTREMITY MMT:  MMT Right eval Left eval  Hip flexion    Hip extension Functional 4/5 Functional 4/5  Hip abduction Functional 4/5 Functional 4/5  Hip adduction    Hip internal rotation    Hip external rotation    Knee flexion Functional 4/5 Functional 4/5  Knee extension Functional 4/5 Functional 4/5  Ankle dorsiflexion 3-/5 NA  Ankle plantarflexion  NA  Ankle inversion  NA  Ankle eversion  NA  (Blank rows = not tested)  TRANSFERS: Eval /  02/17/2023: Sit to stand: SBA requires use of armrests from 18" chair using back of legs against chair & RW to stabilize Stand to sit: SBA requires use of armrests from 18" chair using back of legs against chair & RW to stabilize  GAIT: 05/14/2023: 4 min Walk Test 500' with cane modified independent. Gait Velocity: with cane 2.33 ft/sec comfortable & 3.15 ft/sec fast pace modified independent Pt neg ramp & curb with cane modified independent.  03/26/2023: with cane  Gait Velocity 2.13 ft/sec comfortable self-selected pace with SBA & 2.60 ft/sec fast pace with CGA   Eval / 02/17/2023: Gait pattern: step to pattern, decreased step length- Right, decreased stance time- Left, decreased hip/knee flexion- Left, decreased ankle dorsiflexion- Right, Left hip hike, Right foot flat, genu recurvatum- Left, antalgic, trendelenburg, lateral hip instability, trunk flexed, abducted- Left, and poor foot clearance- Right excessive BUE weight bearing on RW Distance walked: 100' Assistive device utilized: Environmental consultant - 2 wheeled and TTA prosthesis Level of assistance: SBA  FUNCTIONAL TESTs:  05/14/2023: Timed Up & Go with cane 11.09sec and without device 11.25sec   05/12/2023: Berg Balance Scale 34/56  Eval / 02/17/2023:   Sharlene Motts Balance Scale: 11/56   CURRENT PROSTHETIC WEAR ASSESSMENT: 05/12/2023: Pt is independent in prosthetic care including dialysis weight issues, skin check, residual limb care, care of non-amputated limb, prosthetic cleaning, ply sock cleaning, correct ply sock adjustment, proper wear schedule/adjustment, and proper weight-bearing schedule/adjustment. He tolerates prosthesis wear all awake hours without skin or limb pain issues.   05/07/2023: Residual limb has not open areas. One 1mm circle on incision that could be internal suture pushing itself out. Pt has + tinel for neuroma.   03/25/2023: pt reports wearing prosthesis all awake hours including to dialysis with some distal tibia pain.  He  verbalizes how to adjust ply socks, properly donnes and reports proper cleaning.   Eval /  02/17/2023: Patient is dependent with: skin check, residual limb care, care of non-amputated limb, prosthetic cleaning, ply sock cleaning, correct ply sock adjustment, proper wear schedule/adjustment, and proper weight-bearing schedule/adjustment Donning prosthesis: SBA Doffing prosthesis: SBA Prosthetic wear tolerance: 4 hours, 1x/day, 4 of 6 days since delivery Prosthetic weight bearing tolerance: 5 minutes with limb pain 8/10 Edema: pitting with 5 sec capillary refill Residual limb condition: cylindrical shape, no open areas, limited to no hair growth, scar dryness, normal color & temperature Prosthetic description: silicon liner with pin lock suspension, total contact socket with flexible inner socket, dynamic response foot K code/activity level with prosthetic use: Level 3    TODAY'S TREATMENT:                                                                                                                             DATE:  05/14/2023: See objective data  TREATMENT:  DATE:  05/12/2023: Prosthetic Training with Transtibial Amputation: See objective data  Therapeutic Exercise: PT reviewed well-rounded fitness plan to include exercises for flexibility, strength, endurance & balance. Pt verbalized understanding.   Neuromuscular Re-education: See Sharlene Motts under objective data.  PT reviewed results and indications which pt verbalized understanding.    TREATMENT:                                                                                                                             DATE:  05/07/2023: Prosthetic Training with Transtibial Amputation: PT verbal cues on neuroma and possible management options.  PT educated verbally on wife pulling internal incision if it comes out far  enough to grasp with tweezers. PT advised to fully remove any lotion from limb prior to donning prosthesis. Pt verbalized understanding.   Therapeutic Exercise:  Pt plans to join a gym in the near future. PT educated pt on safety and proper use of equipment with TTA prosthesis. PT demo & verbal cues and pt return demo & verbalized understanding.  -recumbent bike including set-up -treadmill safety. Pt amb at 1.3 mph for 2 min.  -leg press BLEs 100# 10 reps; single leg with BLES 50# 5 reps ea. -UE resistance machines with pelvis level by positioning prosthesis  seated chest press BUEs 15# 5 reps -knee ext machine BLEs 15# 10 reps -knee flex machine BLEs 25# 15 reps     HOME EXERCISE PROGRAM: Access Code: [email or web address] URL: [email or web address] Date: 04/21/2023 Prepared by: Vladimir Faster  Exercises - standing calf stretch with forefoot on small step or brick  - 2 x daily - 7 x weekly - 1 sets - 3 reps - 30 seconds hold - Standing Soleus Stretch on Foam 1/2 Roll  - 2 x daily - 7 x weekly - 1 sets - 13 reps - 30 seconds hold - Upright Stance at Door Frame Single Arm  - 1-3 x daily - 7 x weekly - 1 sets - 2 reps - 2 deep breathes hold - Upright Stance at Door Frame with Both Arms  - 1-3 x daily - 7 x weekly - 1 sets - 2 reps - 2 deep breathes hold - Standing posture with back to counter  - 1 x daily - 5 x weekly - 1 sets - 10 reps - 5 seconds hold - Walk with Head Turns  - 1 x daily - 7 x weekly - 1 sets - 10 reps - Walking with Eyes Closed  - 1 x daily - 7 x weekly - 1 sets - 10 reps - Standing Hip Flexion with Resistance (Mirrored)  - 1 x daily - 7 x weekly - 1 sets - 10 reps - Standing Hip Adduction with Resistance (Mirrored)  - 1 x daily - 7 x weekly - 1 sets - 10 reps - Standing Hip Extension with Resistance  - 1 x daily - 7 x weekly - 1 sets - 10 reps - Standing Hip  Abduction with Theraband Resistance  - 1 x daily - 7 x weekly - 1 sets - 10 reps - standing knee extension  -  1 x daily - 7 x weekly - 1 sets - 10 reps - 5 seconds hold - Seated Table Hamstring Stretch long sit with strap assist- 1-2 x daily - 7 x weekly - 1 sets - 3 reps - 30 seconds hold - Hip Flexor Stretch at Edge of Bed  - 1-2 x daily - 7 x weekly - 1 sets - 3 reps - 30 seconds hold - Supine Quadriceps Stretch with Strap on Table  - 1-2 x daily - 7 x weekly - 1 sets - 3 reps - 30 seconds hold  ASSESSMENT: CLINICAL IMPRESSION: Patient met all LTGs. He appears to be safely functioning with prosthesis using cane in community and no device at times in home. He tolerates wear all day and verbalizes proper prosthetic care.  Berg Balance improved 23 points (11/56 to 34/56) indicating decrease in fall risk but his fall risk remains high as score is <45/56.   OBJECTIVE IMPAIRMENTS: Abnormal gait, decreased activity tolerance, decreased balance, decreased knowledge of condition, decreased knowledge of use of DME, decreased mobility, difficulty walking, decreased strength, increased edema, impaired flexibility, postural dysfunction, prosthetic dependency , and pain.   ACTIVITY LIMITATIONS: carrying, lifting, bending, standing, squatting, stairs, transfers, and locomotion level  PARTICIPATION LIMITATIONS: meal prep, cleaning, driving, community activity, and yard work  PERSONAL FACTORS: Education, Fitness, Past/current experiences, Time since onset of injury/illness/exacerbation, and 3+ comorbidities: see PMH  are also affecting patient's functional outcome.   REHAB POTENTIAL: Good  CLINICAL DECISION MAKING: Evolving/moderate complexity  EVALUATION COMPLEXITY: Moderate   GOALS: Goals reviewed with patient? Yes  SHORT TERM GOALS: Target date: 03/19/2023  Patient donnes prosthesis modified independent & verbalizes proper cleaning. Baseline: SEE OBJECTIVE DATA Goal status:  MET 03/24/2023 2.  Patient tolerates prosthesis >12 hrs total /day without skin issues or limb pain <5/10 after standing. Baseline:  SEE OBJECTIVE DATA Goal status: MET 03/24/2023  3.  Patient able to stand 30 seconds without UE support with supervision. Baseline: SEE OBJECTIVE DATA Goal status: MET 03/24/2023  4. Patient ambulates 87' with RW & prosthesis with supervision. Baseline: SEE OBJECTIVE DATA Goal status: MET 03/24/2023  5. Patient negotiates ramps & curbs with RW & prosthesis with supervision. Baseline: SEE OBJECTIVE DATA Goal status: MET 03/24/2023  LONG TERM GOALS: Target date: 05/14/2023  Patient demonstrates & verbalized understanding of prosthetic care to enable safe utilization of prosthesis. Baseline: SEE OBJECTIVE DATA Goal status: MET 05/12/2023  Patient tolerates prosthesis wear >90% of awake hours without skin or limb pain issues. Baseline: SEE OBJECTIVE DATA Goal status: MET 05/12/2023  Berg Balance >/= 30 / 56 to indicate lower fall risk Baseline: SEE OBJECTIVE DATA Goal status: MET 05/12/2023  Patient ambulates >500' with prosthesis & LRAD independently Baseline: SEE OBJECTIVE DATA Goal status: MET 05/14/2023  Patient negotiates ramps, curbs & stairs with single rail with prosthesis & LRAD independently. Baseline: SEE OBJECTIVE DATA Goal status: MET 05/14/2023   PLAN:  PT FREQUENCY: 2x/week  PT DURATION: 12 weeks  PLANNED INTERVENTIONS: Therapeutic exercises, Therapeutic activity, Neuromuscular re-education, Balance training, Gait training, Patient/Family education, Self Care, Stair training, Prosthetic training, DME instructions, Re-evaluation, and physical performance testing  PLAN FOR NEXT SESSION: Discharge PT   Vladimir Faster, PT, DPT 05/14/2023, 10:48 AM

## 2023-05-19 ENCOUNTER — Encounter: Payer: Medicare Other | Admitting: Physical Therapy

## 2023-06-02 ENCOUNTER — Other Ambulatory Visit: Payer: Self-pay | Admitting: Internal Medicine

## 2023-06-30 ENCOUNTER — Ambulatory Visit: Payer: Self-pay | Admitting: Licensed Clinical Social Worker

## 2023-06-30 NOTE — Patient Instructions (Signed)
Visit Information  Thank you for taking time to visit with me today. Please don't hesitate to contact me if I can be of assistance to you.   Following are the goals we discussed today:   Goals Addressed               This Visit's Progress     Patient Stated he is concerned about finances. sometimes it is hard to pay monthly bills (pt-stated)        Interventions:  LCSW spoke via phone today  with [name] about his current needs and medical issues Spoke with client about his dialysis treatments. Spoke with client about his transportation to and from dialysis  treatments Reviewed medication procurement.  Discussed pain issues of client Discussed client use of stair lift. He said he is in process of getting his stair lift repaired Discussed client use of ramp at his home. Discussed client use of wheelchair to ambulate Discussed physical therapy sessions received by client. Client said he has now completed his physical therapy sessions. He said physical therapy sessions ended a few weeks ago and that they were helpful to him Discussed family support. He has support of his spouse, [name] Provided counseling support to [name] Discussed coping skills of client. He has to manage skin issues faced. His wife is supportive. He has to manage financial challenges.  He has to manage paying for gas as needed. Client is doing well managing his needs at present.  He attends scheduled medical appointments, has support of his spouse, and tries to plan how he uses money to pay needed bills. Discussed pain issues of client. Discussed neuropathy issues and his trying to manage neuropathy issues Reminded client of program support with RN, Pharmacist and LCSW Encouraged client to call LCSW for SW support as needed at [phone] [facility] client for phone call with LCSW today         Our next appointment is by telephone on 08/19/23 at 11:00 AM   Please call the care guide team at [phone] if  you need to cancel or reschedule your appointment.   If you are experiencing a Mental Health or Behavioral Health Crisis or need someone to talk to, please go to [facility] [address], [place] [phone])   The patient verbalized understanding of instructions, educational materials, and care plan provided today and DECLINED offer to receive copy of patient instructions, educational materials, and care plan.   The patient has been provided with contact information for the care management team and has been advised to call with any health related questions or concerns.   Kelton Pillar.Andriy Sherk MSW, LCSW Licensed Visual merchandiser [facility] Care Management [phone]

## 2023-06-30 NOTE — Patient Outreach (Signed)
Care Coordination   Follow Up Visit Note   06/30/2023 Name: [name] MRN: [id number] DOB: 10-Feb-1966  [name] is a 57 y.o. year old male who sees [name], MD for primary care. I spoke with  [name] by phone today.  What matters to the patients health and wellness today? Patient is concerned about finances; sometimes it is difficult for him to pay monthly bills    Goals Addressed               This Visit's Progress     Patient Stated he is concerned about finances. sometimes it is hard to pay monthly bills (pt-stated)        Interventions:  LCSW spoke via phone today  with [name] about his current needs and medical issues Spoke with client about his dialysis treatments. Spoke with client about his transportation to and from dialysis  treatments Reviewed medication procurement.  Discussed pain issues of client Discussed client use of stair lift. He said he is in process of getting his stair lift repaired Discussed client use of ramp at his home. Discussed client use of wheelchair to ambulate Discussed physical therapy sessions received by client. Client said he has now completed his physical therapy sessions. He said physical therapy sessions ended a few weeks ago and that they were helpful to him Discussed family support. He has support of his spouse, [name] Provided counseling support to Julies Discussed coping skills of client. He has to manage skin issues faced. His wife is supportive. He has to manage financial challenges.  He has to manage paying for gas as needed. Client is doing well managing his needs at present.  He attends scheduled medical appointments, has support of his spouse, and tries to plan how he uses money to pay needed bills. Discussed pain issues of client. Discussed neuropathy issues and his trying to manage neuropathy issues Reminded client of program support with RN, Pharmacist and LCSW Encouraged client to call LCSW for SW  support as needed at [phone] [facility] client for phone call with LCSW today         SDOH assessments and interventions completed:  Yes  SDOH Interventions Today    Flowsheet Row Most Recent Value  SDOH Interventions   Depression Interventions/Treatment  Counseling  Physical Activity Interventions Other (Comments)  [physical therapy sessions have now ended.]  Stress Interventions Provide Counseling  [has stress related to finances. has stress related to food procurement]        Care Coordination Interventions:  Yes, provided    Interventions Today    Flowsheet Row Most Recent Value  Chronic Disease   Chronic disease during today's visit Other  [spoke with client about client needs]  General Interventions   General Interventions Discussed/Reviewed General Interventions Discussed, Community Resources  Exercise Interventions   Exercise Discussed/Reviewed Physical Activity  [client uses a wheelchair as needed]  Physical Activity Discussed/Reviewed Physical Activity Discussed  Education Interventions   Education Provided Provided Education  Provided Verbal Education On Community Resources  Mental Health Interventions   Mental Health Discussed/Reviewed Coping Strategies  [no mood issues noted]  Nutrition Interventions   Nutrition Discussed/Reviewed Nutrition Discussed  Pharmacy Interventions   Pharmacy Dicussed/Reviewed Pharmacy Topics Discussed  Safety Interventions   Safety Discussed/Reviewed Fall Risk       Follow up plan: Follow up call scheduled for 08/19/23 at 11:00 AM    Encounter Outcome:  Patient Visit Completed   Kelton Pillar.Neelah Mannings MSW,  LCSW Licensed Clinical Social Worker [facility] Care Management [phone]

## 2023-07-18 ENCOUNTER — Encounter (HOSPITAL_BASED_OUTPATIENT_CLINIC_OR_DEPARTMENT_OTHER): Payer: Self-pay | Admitting: Emergency Medicine

## 2023-07-18 ENCOUNTER — Other Ambulatory Visit: Payer: Self-pay

## 2023-07-18 ENCOUNTER — Emergency Department (HOSPITAL_BASED_OUTPATIENT_CLINIC_OR_DEPARTMENT_OTHER)
Admission: EM | Admit: 2023-07-18 | Discharge: 2023-07-18 | Disposition: A | Discharge status: Home / Self Care | Payer: Medicare Other | Attending: Emergency Medicine | Admitting: Emergency Medicine

## 2023-07-18 DIAGNOSIS — N186 End stage renal disease: Secondary | ICD-10-CM | POA: Diagnosis not present

## 2023-07-18 DIAGNOSIS — Z7982 Long term (current) use of aspirin: Secondary | ICD-10-CM | POA: Insufficient documentation

## 2023-07-18 DIAGNOSIS — L97511 Non-pressure chronic ulcer of other part of right foot limited to breakdown of skin: Secondary | ICD-10-CM | POA: Diagnosis present

## 2023-07-18 DIAGNOSIS — E08621 Diabetes mellitus due to underlying condition with foot ulcer: Secondary | ICD-10-CM | POA: Diagnosis not present

## 2023-07-18 DIAGNOSIS — Z992 Dependence on renal dialysis: Secondary | ICD-10-CM | POA: Diagnosis not present

## 2023-07-18 NOTE — ED Triage Notes (Signed)
Pt referred by optometrist, Blurred vision to LT eye. Also reports concern for sepsis to RT foot. 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

## 2023-07-18 NOTE — Discharge Instructions (Signed)
Clinically no signs of sepsis here today.  Follow-up with the ophthalmology as scheduled.  Follow-up with your podiatrist as scheduled.  Return and get seen for fever or chills or any new or worse symptoms.

## 2023-07-18 NOTE — ED Provider Notes (Signed)
[place] EMERGENCY DEPARTMENT AT [facility] Provider Note   CSN: [id number] Arrival date & time: 07/18/23  1443     History  Chief Complaint  Patient presents with   Blurred Vision   Foot Pain    [name] is a 57 y.o. male.  Patient seen by ophthalmology for left eye irritation some pain and some visual changes.  Ophthalmology get concerned that patient may be septic.  But patient denies any fever or chills.  Denies feeling bad.  Patient does have a right foot wound is followed carefully by podiatry.  Patient states that that is actually healing well.  He was seen by them on the 26 and they thought it was doing well.  Patient has type 2 diabetes peripheral vascular disease end-stage renal disease normally gets dialysis Monday Wednesdays and Fridays.  Patient did skip Friday because the eye was bothering him.  But has done that before without any difficulty.  Patient has no shortness of breath.  Vital signs here temp 98.1 pulse 77 respiration 16 blood pressure 134/92.  Patient denies any fevers in the last several days and also did not have a fever at the ophthalmologist office.  Ophthalmology has dressed to the left eye symptomatology.  Cannot see their notes so not sure what the formal diagnosis is.  They are planning to see him again on Monday in the office.       Home Medications Prior to Admission medications   Medication Sig Start Date End Date Taking? Authorizing Provider  amLODipine (NORVASC) 10 MG tablet Take 10 mg by mouth at bedtime.  08/03/20   [provider]  aspirin EC 81 MG tablet Take 1 tablet (81 mg total) by mouth daily. 06/28/19   Iran Ouch, MD  atropine 1 % ophthalmic solution Place 1 drop into the left eye daily.    [provider]  carboxymethylcellulose (REFRESH TEARS) 0.5 % SOLN Place 1 drop into both eyes 3 (three) times daily as needed (dry eyes).    [provider]  clopidogrel (PLAVIX) 75 MG tablet Take 75  mg by mouth daily.    [provider]  Continuous Blood Gluc Sensor (FREESTYLE LIBRE 14 DAY SENSOR) MISC See admin instructions. 06/20/19   [provider]  EPINEPHrine (EPIPEN 2-PAK) 0.3 mg/0.3 mL IJ SOAJ injection Inject 0.3 mg into the muscle as needed for anaphylaxis.    [provider]  ethyl chloride spray 1 application See admin instructions. As needed for pain on dialysis days 03/15/21   [provider]  Evolocumab (REPATHA SURECLICK) 140 MG/ML SOAJ Inject 140 mg into the skin every 14 (fourteen) days. 03/26/21   [provider]  hydrALAZINE (APRESOLINE) 50 MG tablet Take 25 mg by mouth in the morning and at bedtime. 12/18/20   [provider]  isosorbide mononitrate (IMDUR) 30 MG 24 hr tablet Take 30 mg by mouth daily.    [provider]  metoprolol tartrate (LOPRESSOR) 100 MG tablet Take 100 mg by mouth 2 (two) times daily.    [provider]  multivitamin (RENA-VIT) TABS tablet Take 1 tablet by mouth daily with lunch.    [provider]  nitroGLYCERIN (NITROSTAT) 0.4 MG SL tablet Place 0.4 mg under the tongue every 5 (five) minutes as needed for chest pain.    [provider]  pantoprazole (PROTONIX) 40 MG tablet Take 40 mg by mouth daily.    [provider]  sevelamer carbonate (RENVELA) 800 MG tablet  Take 1,600 mg by mouth 3 (three) times daily with meals. 07/25/20   [provider]  sodium zirconium cyclosilicate (LOKELMA) 10 g PACK packet Take 10 g by mouth See admin instructions. Non dialysis days(Tuesday,Thursday,Saturday and Sunday) as needed for high potassium    [provider]  traZODone (DESYREL) 150 MG tablet Take 150 mg by mouth at bedtime as needed for sleep. 07/15/21   [provider]  loratadine (CLARITIN) 10 MG tablet Take 10 mg by mouth daily as needed for allergies.  11/06/19  [provider]      Allergies    Crestor [rosuvastatin], Lipitor  [atorvastatin], Amoxicillin, Chlorhexidine, Hydromorphone, Percocet [oxycodone-acetaminophen], Morphine, and Gabapentin    Review of Systems   Review of Systems  Constitutional:  Negative for chills and fever.  HENT:  Negative for ear pain and sore throat.   Eyes:  Positive for pain and redness. Negative for visual disturbance.  Respiratory:  Negative for cough and shortness of breath.   Cardiovascular:  Negative for chest pain and palpitations.  Gastrointestinal:  Negative for abdominal pain and vomiting.  Genitourinary:  Negative for dysuria and hematuria.  Musculoskeletal:  Negative for arthralgias and back pain.  Skin:  Positive for wound. Negative for color change and rash.  Neurological:  Negative for seizures and syncope.  All other systems reviewed and are negative.   Physical Exam Updated Vital Signs BP 112/78   Pulse 73   Temp 98.1 F (36.7 C) (Oral)   Resp 16   Ht 1.778 m (5\' 10" )   Wt 70.3 kg   SpO2 93%   BMI 22.24 kg/m  Physical Exam Vitals and nursing note reviewed.  Constitutional:      General: He is not in acute distress.    Appearance: Normal appearance. He is well-developed. He is not ill-appearing.  HENT:     Head: Normocephalic and atraumatic.  Eyes:     Conjunctiva/sclera: Conjunctivae normal.     Comments: Left eye erythematous.  Details of this all evaluated by ophthalmology prior to patient being sent here.  Cardiovascular:     Rate and Rhythm: Normal rate and regular rhythm.     Heart sounds: No murmur heard. Pulmonary:     Effort: Pulmonary effort is normal. No respiratory distress.     Breath sounds: Normal breath sounds.  Abdominal:     Palpations: Abdomen is soft.     Tenderness: There is no abdominal tenderness.  Musculoskeletal:        General: No swelling.     Cervical back: Neck supple.     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  Patient has a medial healing ulcer measuring about a centimeter.  Not open not purulent  no erythema no tenderness.  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  Skin:    General: Skin is warm and dry.     Capillary Refill: Capillary refill takes less than 2 seconds.  Neurological:     Mental Status: He is alert. Mental status is at baseline.  Psychiatric:        Mood and Affect: Mood normal.     ED Results / Procedures / Treatments   Labs (all labs ordered are listed, but only abnormal results are displayed) Labs Reviewed - No data to display  EKG None  Radiology No results found.  Procedures Procedures    Medications Ordered in ED Medications - No data to display  ED Course/ Medical Decision Making/ A&P  Medical Decision Making  Patient without any concerning signs or symptoms suggestive of sepsis.  Patient's vital signs very normal.  No history of any fever or chills.  Patient feels that the right foot is healing well nothing anything worse about it.  Sent in from the ophthalmologist office where they are treating him with something for left eye pain and blurred vision.  Cannot see their notes so not sure what their concern was.  Apparently ophthalmology contacted Dr. Rosalia Hammers at [facility] ED.  And it was recommended the patient be evaluated.  Do not see any need for any antibiotics or even labs at this point in time.  Patient followed by podiatry every 2 weeks.  Patient states right foot wound is healing well and I concur it does not look secondarily infected.  No increased bone tenderness in that area do not feel that we need an x-ray to rule out osteo-.  Feel the patient stable for discharge home and continued follow-up with ophthalmology.  Patient is planned to be seen by ophthalmology again in 2 days.  Patient is a diabetic end-stage renal disease dialysis.  And patient has peripheral vascular disease.  Patient receives dialysis Monday Wednesday and Friday.  Patient did miss dialysis on Friday because of the discomfort.  However  he is not short of breath.  And he has skipped before he states without any difficulty   Final Clinical Impression(s) / ED Diagnoses Final diagnoses:  Diabetic ulcer of right foot associated with diabetes mellitus due to underlying condition, limited to breakdown of skin, unspecified part of foot (HCC)  End stage renal disease on dialysis [facility])    Rx / DC Orders ED Discharge Orders     None         Vanetta Mulders, MD 07/18/23 1601

## 2023-07-29 ENCOUNTER — Telehealth: Payer: Self-pay | Admitting: Physical Therapy

## 2023-07-29 NOTE — Telephone Encounter (Signed)
Patient wife called and ask if you would call her back. She wanted to know the name of the shoe store. CB#[phone]

## 2023-08-19 ENCOUNTER — Ambulatory Visit: Payer: Self-pay | Admitting: Licensed Clinical Social Worker

## 2023-08-19 NOTE — Patient Outreach (Signed)
Care Coordination   08/19/2023 Name: [name] MRN: [id number] DOB: 08-30-1966   Care Coordination Outreach Attempts:  An unsuccessful telephone outreach was attempted today to offer the patient information about available care coordination services.  Follow Up Plan:  Additional outreach attempts will be made to offer the patient care coordination information and services.   Encounter Outcome:  No Answer   Care Coordination Interventions:  No, not indicated    Kelton Pillar.Dona Klemann MSW, LCSW Licensed Visual merchandiser [facility] Care Management [phone]

## 2023-08-25 ENCOUNTER — Ambulatory Visit: Payer: Self-pay | Admitting: Licensed Clinical Social Worker

## 2023-08-25 NOTE — Patient Outreach (Signed)
  Care Coordination   Follow Up Visit Note   08/25/2023 Name: [name] MRN: [id number] DOB: 08-16-1966  [name] is a 57 y.o. year old male who sees [name], MD for primary care. I spoke with  [name] by phone today.  What matters to the patients health and wellness today?  Patient Stated he is concerned about finances. sometimes it is hard to pay monthly bills    Goals Addressed               This Visit's Progress     Patient Stated he is concerned about finances. sometimes it is hard to pay monthly bills (pt-stated)        Interventions:  LCSW spoke via phone today  with [name] about his current needs and medical issues Spoke with client about stair lift in home.  He said he is still making payments  on stair lift. He said he needed some repair work done to stair lift Reviewed medication procurement for client Discussed client use of ramp at his home. Client said he uses ramp to go in and out of his home Provided counseling support to [name] Discussed coping skills of client. He has to manage skin issues faced. He said he has skin area on his foot that had to be dressed or changed periodically.  Discussed vision needs. He is hoping to have cataract removed from his eye in January of 2025. However, skin area on his foot has to be healed well before cataract procedure can be conducted. His wife is supportive. He has to manage financial challenges.  He has to manage paying for gas as needed. Client is doing well managing his needs at present.  He attends scheduled medical appointments, has support of his spouse, and tries to plan how he uses money to pay needed bills. Reminded client of program support with RN, Pharmacist and LCSW Encouraged client to call LCSW for SW support as needed at [phone] [facility] client for phone call with LCSW today         SDOH assessments and interventions completed:  Yes  SDOH Interventions Today    Flowsheet Row  Most Recent Value  SDOH Interventions   Depression Interventions/Treatment  Counseling  Physical Activity Interventions Other (Comments)  [mobility challenges]  Stress Interventions Provide Counseling  [has stress in managing medical needs]        Care Coordination Interventions:  Yes, provided   Interventions Today    Flowsheet Row Most Recent Value  Chronic Disease   Chronic disease during today's visit Other  [spoke with client about client needs]  General Interventions   General Interventions Discussed/Reviewed General Interventions Discussed, Community Resources  Education Interventions   Education Provided Provided Education  Provided Engineer, [employer]  Mental Health Interventions   Mental Health Discussed/Reviewed Coping Strategies  [no mood issues noted]  Nutrition Interventions   Nutrition Discussed/Reviewed Nutrition Discussed  Pharmacy Interventions   Pharmacy Dicussed/Reviewed Pharmacy Topics Discussed  Safety Interventions   Safety Discussed/Reviewed Fall Risk        Follow up plan: Follow up call scheduled for 09/28/23 at 10:00 AM    Encounter Outcome:  Patient Visit Completed   Kelton Pillar.Dakwan Pridgen MSW, LCSW Licensed Visual merchandiser [facility] Care Management [phone]

## 2023-08-25 NOTE — Patient Instructions (Signed)
Visit Information  Thank you for taking time to visit with me today. Please don't hesitate to contact me if I can be of assistance to you.   Following are the goals we discussed today:   Goals Addressed               This Visit's Progress     Patient Stated he is concerned about finances. sometimes it is hard to pay monthly bills (pt-stated)        Interventions:  LCSW spoke via phone today  with [name] about his current needs and medical issues Spoke with client about stair lift in home.  He said he is still making payments  on stair lift. He said he needed some repair work done to stair lift Reviewed medication procurement for client Discussed client use of ramp at his home. Client said he uses ramp to go in and out of his home Provided counseling support to [name] Discussed coping skills of client. He has to manage skin issues faced. He said he has skin area on his foot that had to be dressed or changed periodically.  Discussed vision needs. He is hoping to have cataract removed from his eye in January of 2025. However, skin area on his foot has to be healed well before cataract procedure can be conducted. His wife is supportive. He has to manage financial challenges.  He has to manage paying for gas as needed. Client is doing well managing his needs at present.  He attends scheduled medical appointments, has support of his spouse, and tries to plan how he uses money to pay needed bills. Reminded client of program support with RN, Pharmacist and LCSW Encouraged client to call LCSW for SW support as needed at [phone] [facility] client for phone call with LCSW today         Our next appointment is by telephone on 09/28/23 at 10:00 AM   Please call the care guide team at [phone] if you need to cancel or reschedule your appointment.   If you are experiencing a Mental Health or Behavioral Health Crisis or need someone to talk to, please go to [facility] [address] [place] [phone])   The patient verbalized understanding of instructions, educational materials, and care plan provided today and DECLINED offer to receive copy of patient instructions, educational materials, and care plan.   The patient has been provided with contact information for the care management team and has been advised to call with any health related questions or concerns.   Kelton Pillar.Ronne Savoia MSW, LCSW Licensed Visual merchandiser [facility] Care Management [phone]

## 2023-09-05 IMAGING — CR DG CHEST 2V
1 series · 2 of 2 positions shown · non-contrast
Comparison: 02/10/2021

CLINICAL DATA: Chest pain

EXAM:
CHEST - 2 VIEW

[Series 1: dg chest 2 view · 0.14mm/px · 2 of 2 slices shown]
[im 1/2]
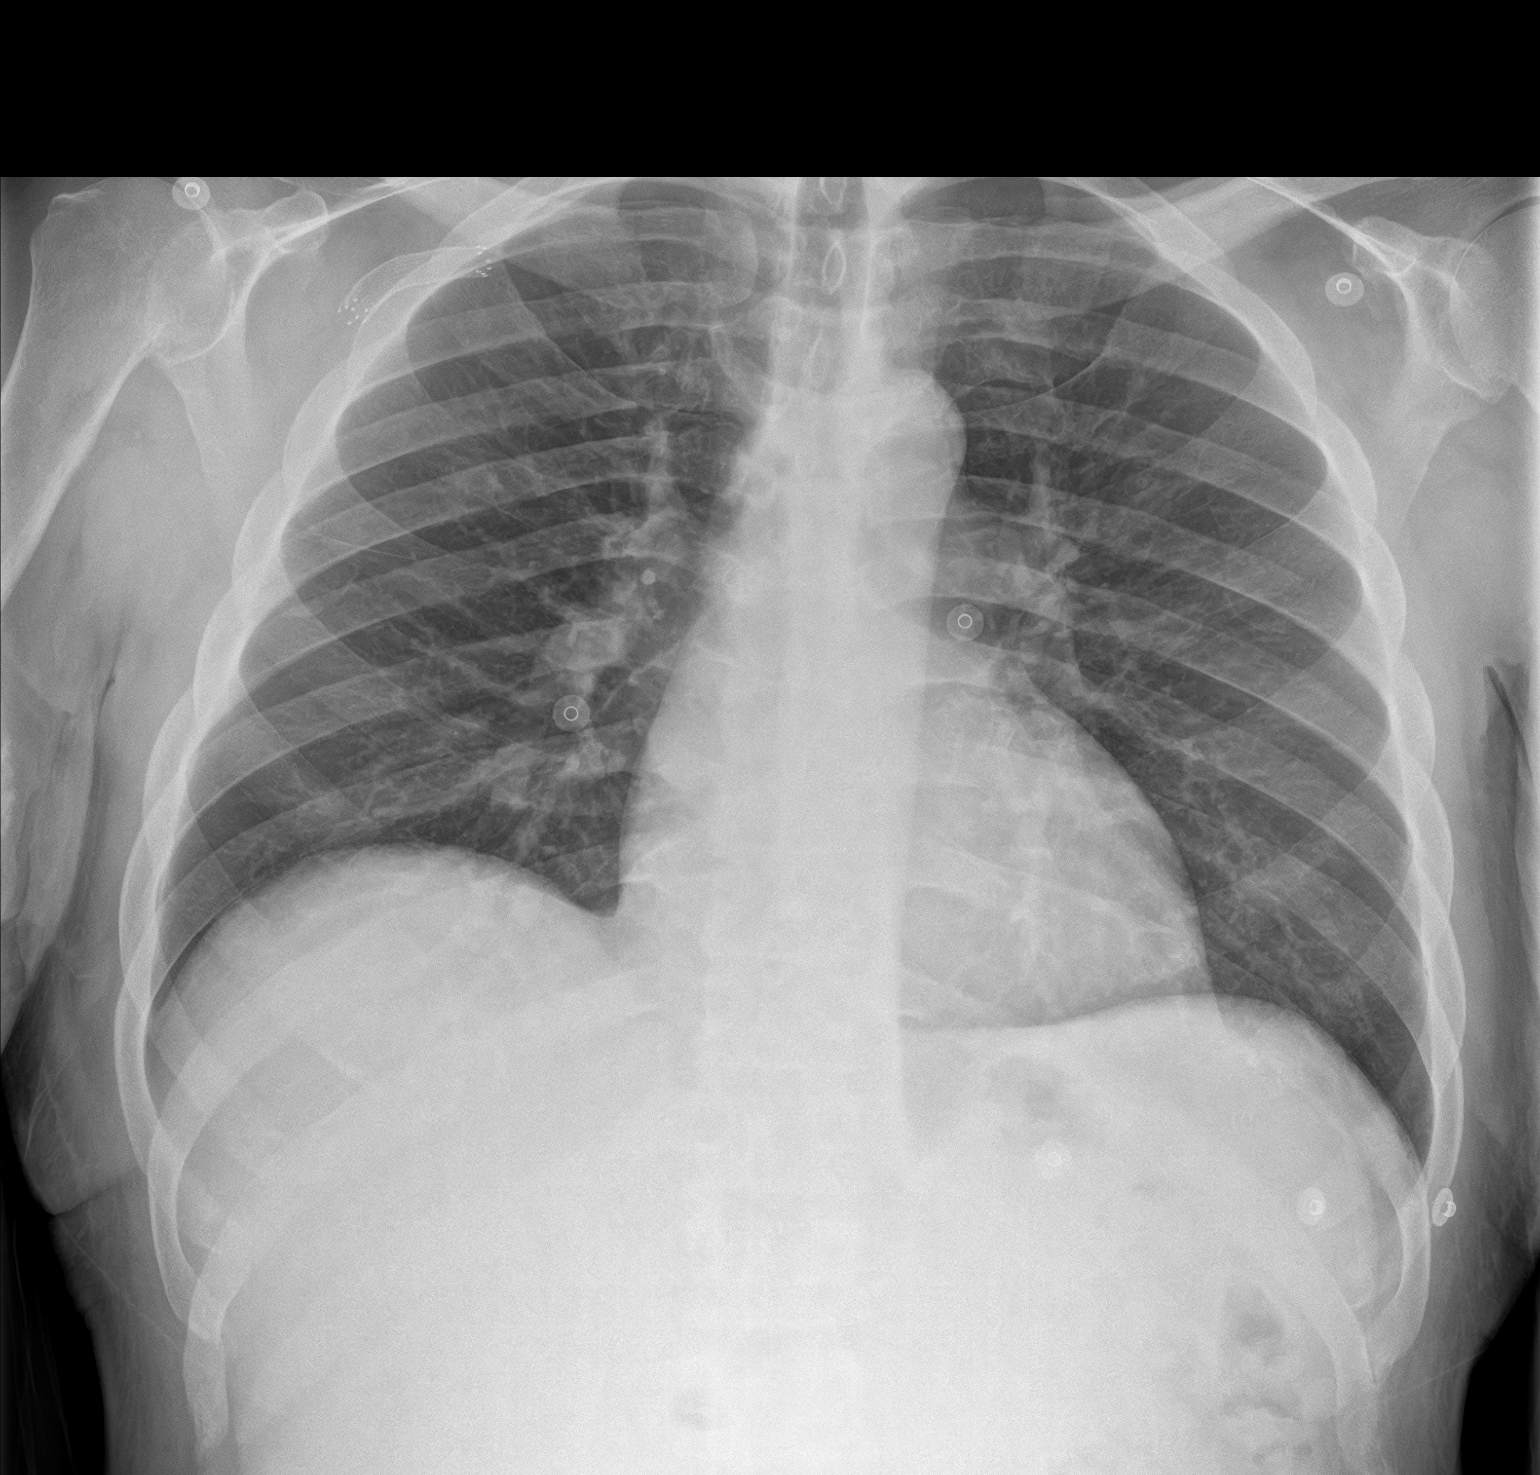
[im 2/2]
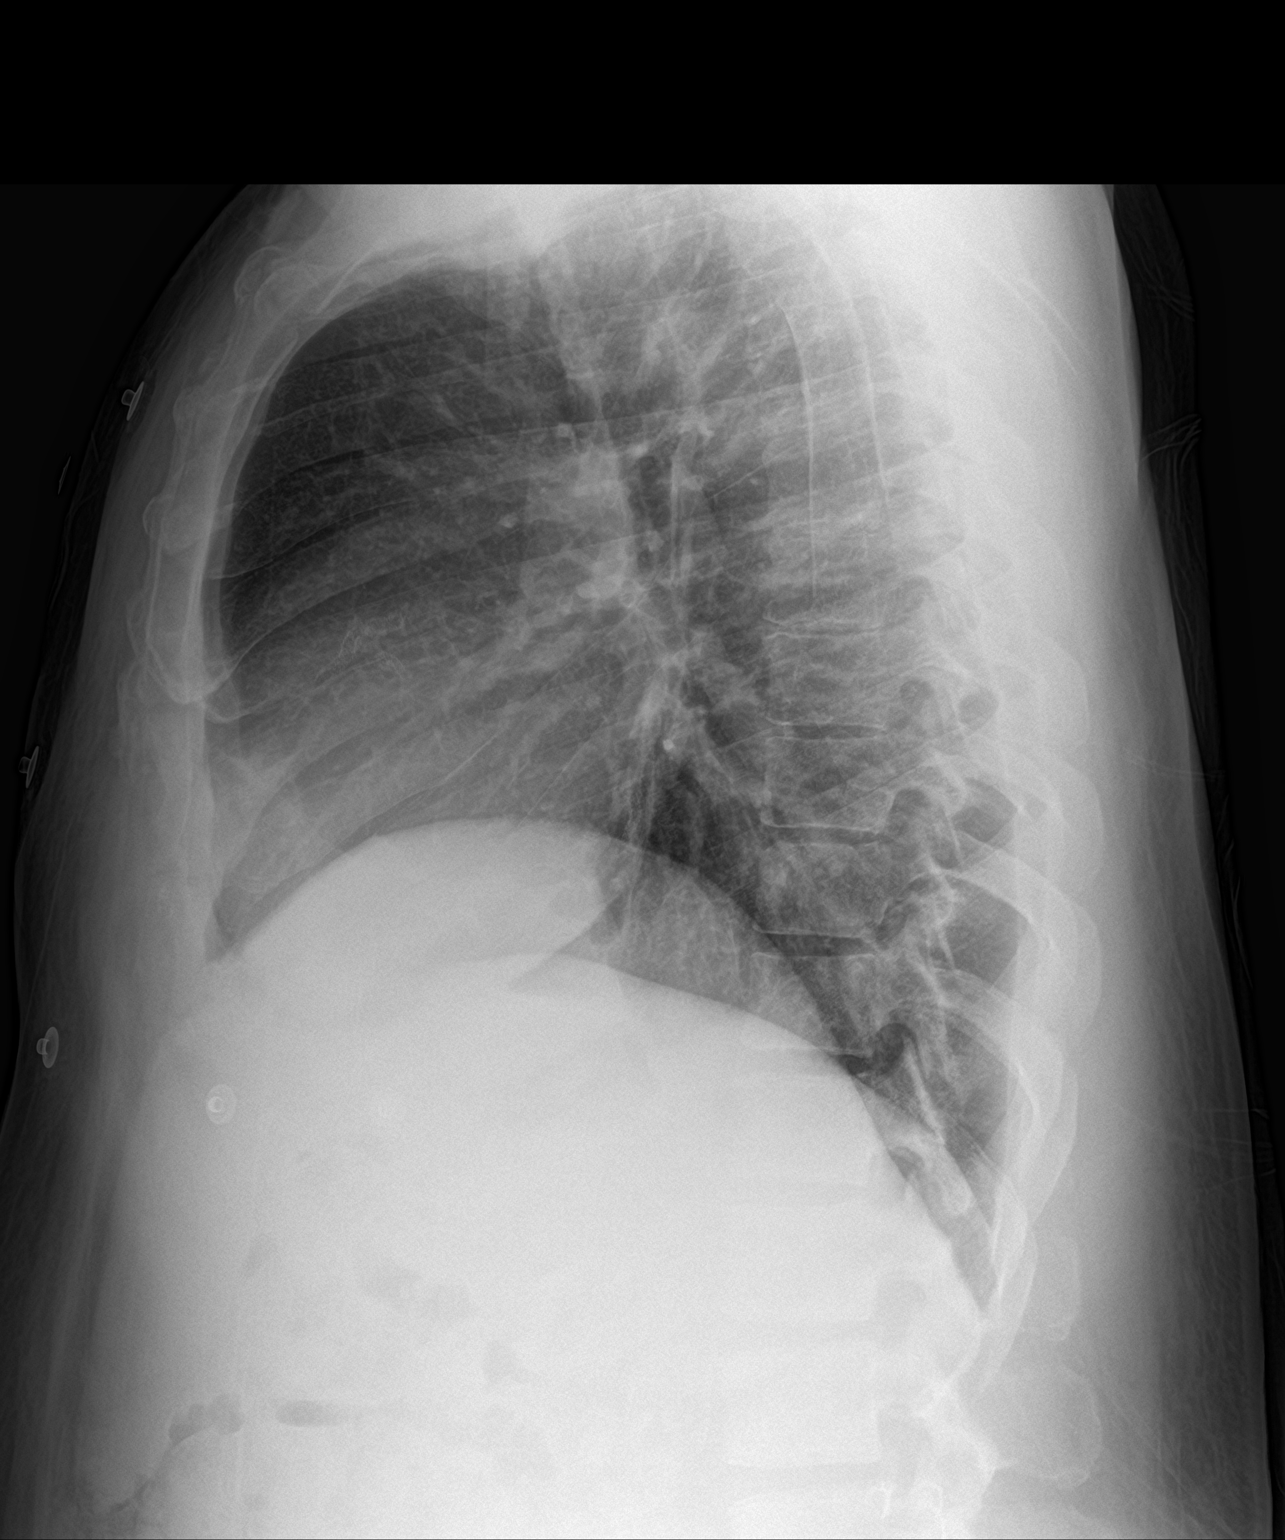

[2 of 2 positions shown; findings below may reference images not displayed]

FINDINGS: A vascular stent is identified in the expected location of the right
subclavian vein. Normal heart size. No pleural effusion or edema. No
airspace opacities identified. The visualized osseous structures are
unremarkable.
IMPRESSION: No active cardiopulmonary abnormalities.

## 2023-09-28 ENCOUNTER — Ambulatory Visit: Payer: Self-pay | Admitting: Licensed Clinical Social Worker

## 2023-09-28 IMAGING — CR DG CHEST 2V
2 series · 2 of 2 positions shown · non-contrast
Comparison: 08/20/2021

CLINICAL DATA: Chest pain

EXAM:
CHEST - 2 VIEW

[chest lat]
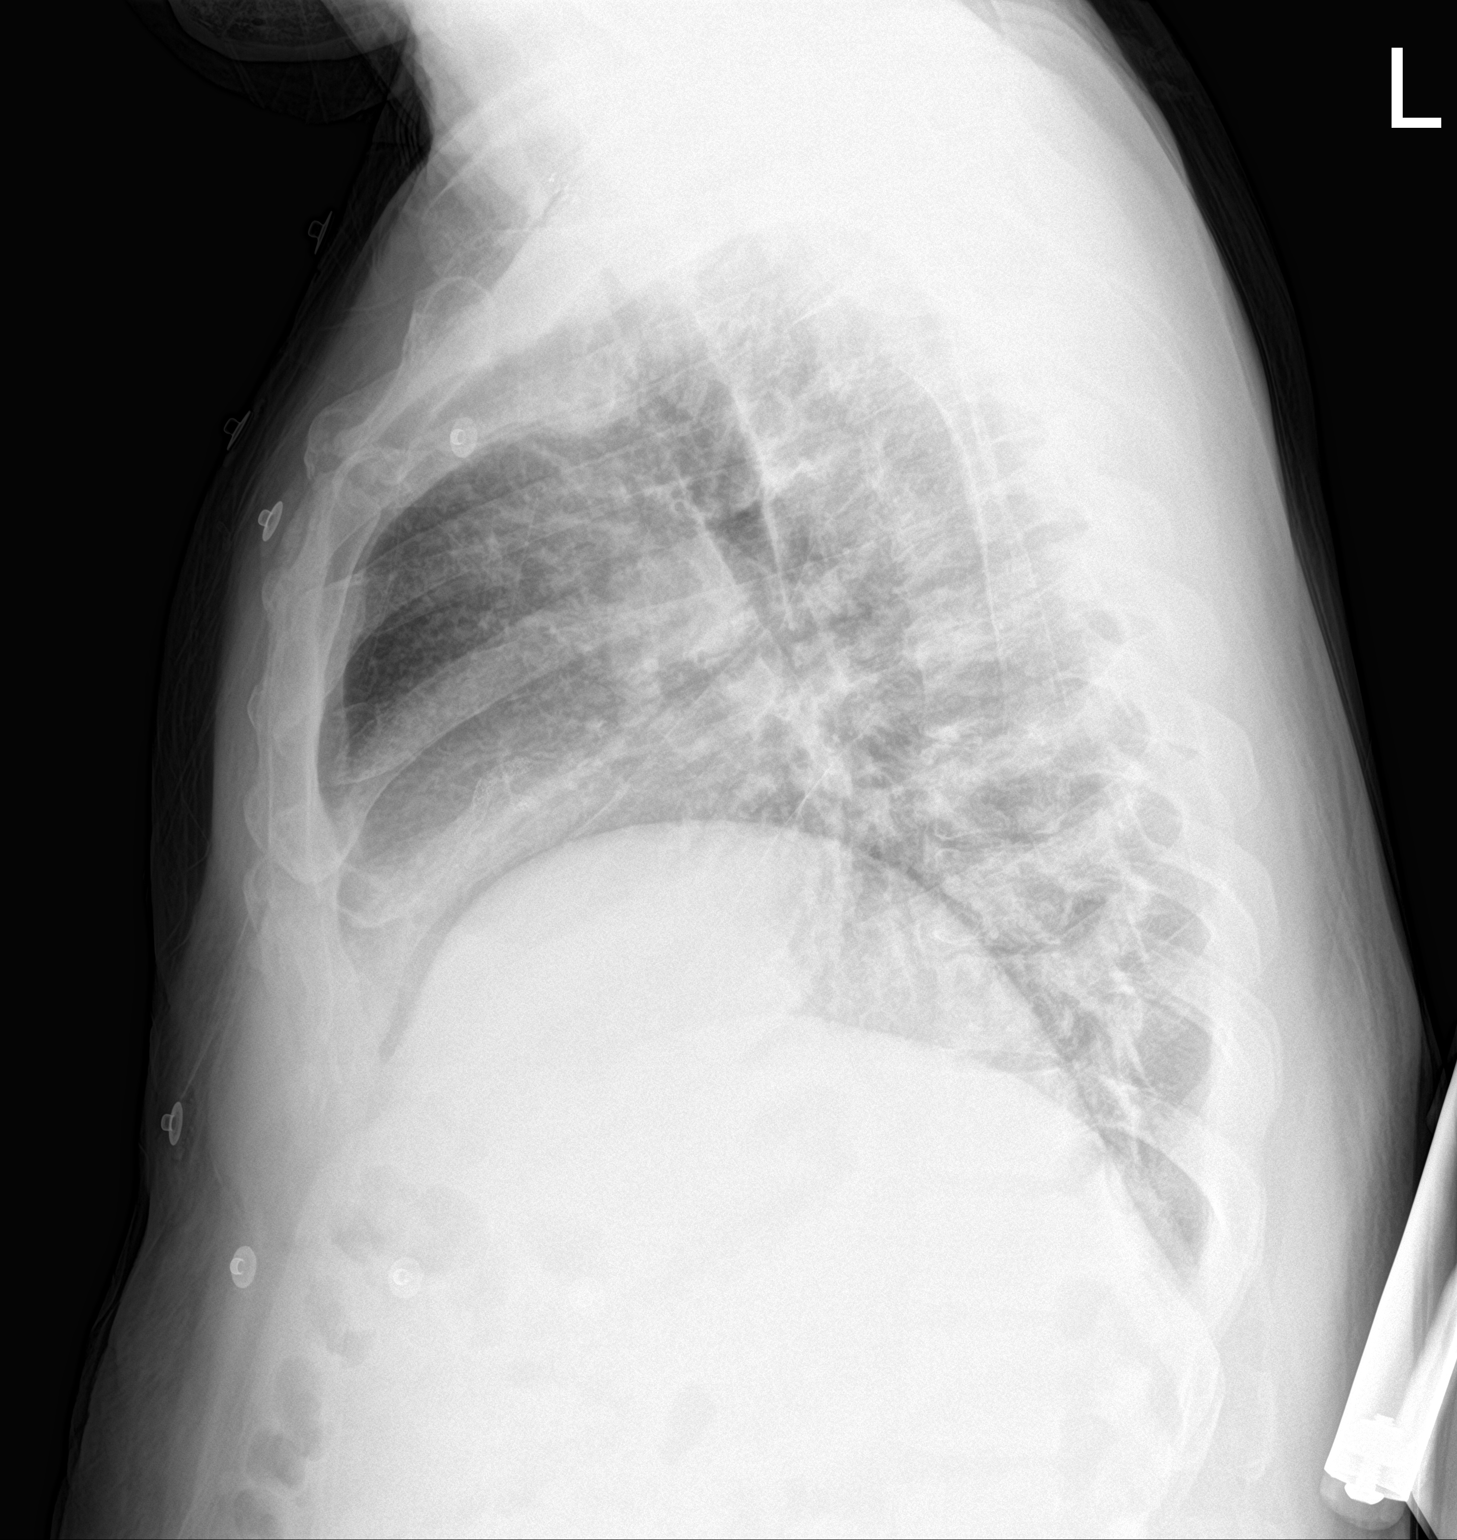

[chest ap]
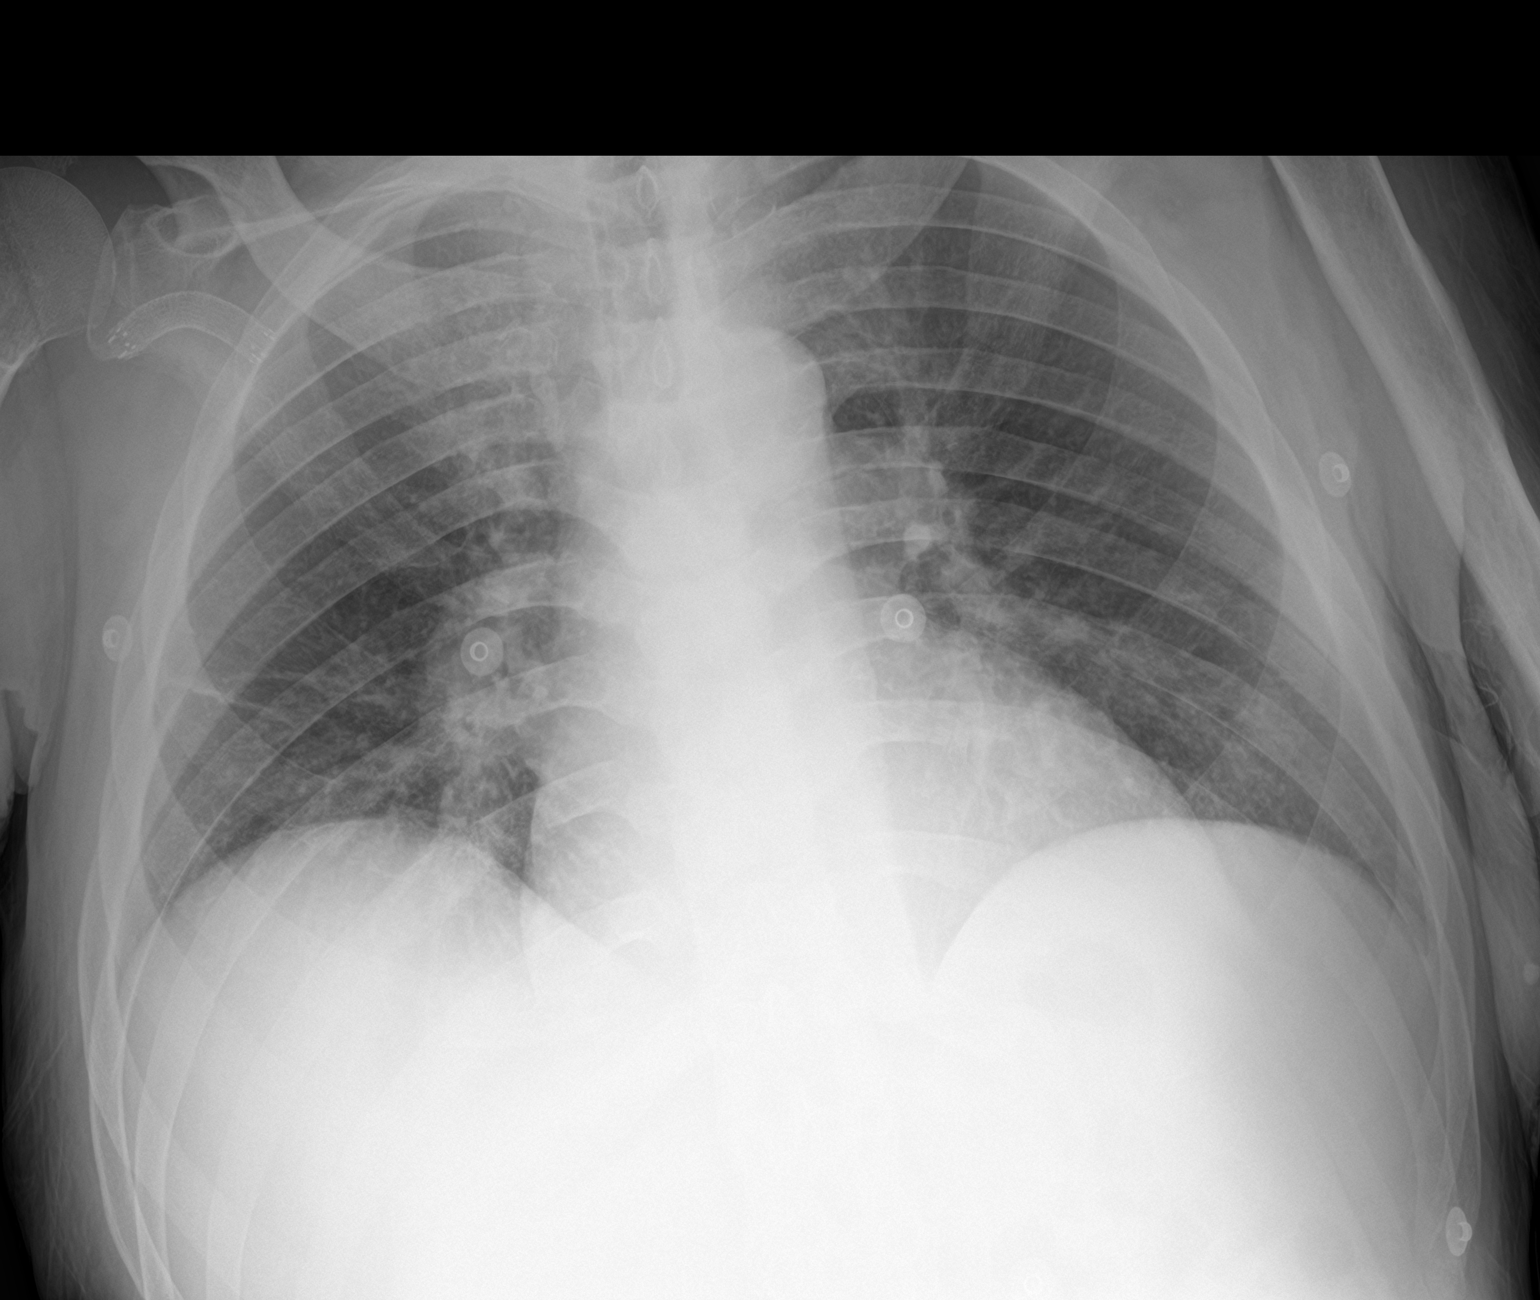

[2 of 2 positions shown; findings below may reference images not displayed]

FINDINGS: Generous heart size accentuated by low lung volumes. Indistinct
perihilar opacity. No pleural fluid or pneumothorax. Right
subclavian stenting.
IMPRESSION: 1. Indistinct perihilar opacity which could be edema or atelectasis.
2. Chronic cardiomegaly.

## 2023-09-28 NOTE — Patient Instructions (Signed)
Visit Information  Thank you for taking time to visit with me today. Please don't hesitate to contact me if I can be of assistance to you.   Following are the goals we discussed today:   Goals Addressed               This Visit's Progress     Patient Stated he is concerned about finances. sometimes it is hard to pay monthly bills (pt-stated)        Interventions:  LCSW spoke via phone today  with [name] about his current needs and medical issues Spoke with Gerone about his weekly dialysis treatments. He said his weekly dialysis treatments are going well. He did not mention any transport problems going to and from dialysis treatments Discussed medication procurement for client Client has support from his spouse Client and LCSW spoke of client use of ramp at this home. He said he uses ramp regularly to go in and out of his home. He also has a stair lift in his home to help him go up and down stairs in his home Provided counseling support to [name] Discussed skin care for client. He spoke of skin care area on his foot. He said area on his foot is healing well and is almost healed, Client has previously said he has to plan well how he pays for monthly bills due.  He has said that he makes payments on stair lift in his home. Reminded client of program support with RN, Pharmacist and LCSW Encouraged client to call LCSW for SW support as needed at [phone] [facility] client for phone call with LCSW today         Our next appointment is by telephone on 11/17/23 at 11:00 AM   Please call the care guide team at [phone] if you need to cancel or reschedule your appointment.   If you are experiencing a Mental Health or Behavioral Health Crisis or need someone to talk to, please go to [facility] [address], [place] [phone])   The patient verbalized understanding of instructions, educational materials, and care plan provided today  and DECLINED offer to receive copy of patient instructions, educational materials, and care plan.   The patient has been provided with contact information for the care management team and has been advised to call with any health related questions or concerns.   Kelton Pillar.Sven Pinheiro MSW, LCSW Licensed Visual merchandiser [facility] Care Management [phone]

## 2023-09-28 NOTE — Patient Outreach (Signed)
  Care Coordination   Follow Up Visit Note   09/28/2023 Name: [name] MRN: [id number] DOB: 07-Feb-1966  [name] is a 57 y.o. year old male who sees [name] Contras, MD for primary care. I spoke with  [name] by phone today.  What matters to the patients health and wellness today?  Patient Stated he is concerned about finances. sometimes it is hard to pay monthly bills (pt-stated)     Goals Addressed               This Visit's Progress     Patient Stated he is concerned about finances. sometimes it is hard to pay monthly bills (pt-stated)        Interventions:  LCSW spoke via phone today  with [name] about his current needs and medical issues Spoke with [name] about his weekly dialysis treatments. He said his weekly dialysis treatments are going well. He did not mention any transport problems going to and from dialysis treatments Discussed medication procurement for client Client has support from his spouse Client and LCSW spoke of client use of ramp at this home. He said he uses ramp regularly to go in and out of his home. He also has a stair lift in his home to help him go up and down stairs in his home Provided counseling support to [name] Discussed skin care for client. He spoke of skin care area on his foot. He said area on his foot is healing well and is almost healed, Client has previously said he has to plan well how he pays for monthly bills due.  He has said that he makes payments on stair lift in his home. Reminded client of program support with RN, Pharmacist and LCSW Encouraged client to call LCSW for SW support as needed at [phone] [facility] client for phone call with LCSW today         SDOH assessments and interventions completed:  Yes  SDOH Interventions Today    Flowsheet Row Most Recent Value  SDOH Interventions   Depression Interventions/Treatment  Counseling  Physical Activity Interventions Other (Comments)  [has some mobility  challenges. He uses a ramp to help him go in and out of his home]  Stress Interventions Provide Counseling  [client goes to and from dialysis 3 times weekly. He has some financial stress issues]        Care Coordination Interventions:  Yes, provided   Interventions Today    Flowsheet Row Most Recent Value  Chronic Disease   Chronic disease during today's visit Other  [spoke with client about client needs]  General Interventions   General Interventions Discussed/Reviewed General Interventions Discussed, Community Resources  Education Interventions   Education Provided Provided Education  Provided Verbal Education On [employer]  Mental Health Interventions   Mental Health Discussed/Reviewed Coping Strategies  [client has stress in managing financial needs. He has stress in managing medical needs]  Nutrition Interventions   Nutrition Discussed/Reviewed Nutrition Discussed  Pharmacy Interventions   Pharmacy Dicussed/Reviewed Pharmacy Topics Discussed  Safety Interventions   Safety Discussed/Reviewed Fall Risk        Follow up plan: Follow up call scheduled for 11/17/23 at 11:00 AM    Encounter Outcome:  Patient Visit Completed   Kelton Pillar.Windell Musson MSW, LCSW Licensed Visual merchandiser [facility] Care Management [phone]

## 2023-11-17 ENCOUNTER — Ambulatory Visit: Payer: Self-pay | Admitting: Licensed Clinical Social Worker

## 2023-11-17 NOTE — Patient Outreach (Signed)
Care Coordination   Follow Up Visit Note   11/17/2023 Name: [name] MRN: [id number] DOB: 1966/06/03  [name] is a 58 y.o. year old male who sees [name] Contras, MD for primary care. I spoke with  [name] by phone today.  What matters to the patients health and wellness today?  Patient Stated he is concerned about finances. sometimes it is hard to pay monthly bills (pt-stated)     Goals Addressed               This Visit's Progress     Patient Stated he is concerned about finances. sometimes it is hard to pay monthly bills (pt-stated)        Interventions:  LCSW spoke via phone today  with [name] about his current needs and medical issues Spoke with [name] about his weekly dialysis treatments. He said his weekly dialysis treatments are going well. He did not mention any transport problems going to and from dialysis treatments. He discussed that he sometimes gets sad that he cannot do some of the activities he once did. He said his dialysis treatments are required and he has to have them to survive. He said he is on dialysis machine for about 4 hours each time he receives dialysis Discussed support of client's spouse Client has stress related to financial needs. He said finances are difficult.  His wife does work but they have to plan well  how to spend their  household income.  Getting an unexpected bill can be difficult for client to manage.  LCSW talked with client about Facilities manager for financial planning and strategies. Client said he had talked with such agencies previously and did not want to talk with such an agency since they would suggest that he would give up (possibly) use of his credit cards. Client did not seem interested in talking with Credit Counseling Agency Discussed client home access. He has a ramp to use to go in and out of his home Discussed step life in his home. Client has step lift and is making monthly payments on step  lift. Client said he is not driving at present. He did not mention any problems going to and from dialysis treatments Reminded client of program support with RN, LCSW, Pharmacist Discussed skin issues. Client said he has been having some rash or outbreak on his back but he has been treating this skin need at his home. Discussed client appetite. Encouraged [name] to call LCSW as needed for SW support at [phone] [name] was appreciative of call from LCSW today           SDOH assessments and interventions completed:  Yes  SDOH Interventions Today    Flowsheet Row Most Recent Value  SDOH Interventions   Depression Interventions/Treatment  Counseling  Physical Activity Interventions Other (Comments)  [challenges with mobility]  Stress Interventions Provide Counseling  [client has financial stress,  he has stress in managing medical needs]        Care Coordination Interventions:  Yes, provided   Interventions Today    Flowsheet Row Most Recent Value  Chronic Disease   Chronic disease during today's visit Other  [spoke with client about client needs]  General Interventions   General Interventions Discussed/Reviewed General Interventions Discussed, Community Resources  Education Interventions   Education Provided Provided Education  Provided Engineer, petroleum On [employer] as possible support for managing client finances]  Mental  Health Interventions   Mental Health Discussed/Reviewed Coping Strategies  [name] is trying to use coping skills to manage anxiety and stress issues faced]  Nutrition Interventions   Nutrition Discussed/Reviewed Nutrition Discussed  Pharmacy Interventions   Pharmacy Dicussed/Reviewed Pharmacy Topics Discussed  Safety Interventions   Safety Discussed/Reviewed Fall Risk        Follow up plan: Follow up call scheduled for 01/19/2024  at 10:00 AM  Encounter Outcome:  Patient Visit Completed     [name]  MSW, LCSW [name] Licensed Clinical Social Worker Direct Dial:  [phone] Fax:  [phone] Website:  [email or web address]

## 2023-11-17 NOTE — Patient Instructions (Signed)
Visit Information  Thank you for taking time to visit with me today. Please don't hesitate to contact me if I can be of assistance to you.   Following are the goals we discussed today:   Goals Addressed               This Visit's Progress     Patient Stated he is concerned about finances. sometimes it is hard to pay monthly bills (pt-stated)        Interventions:  LCSW spoke via phone today  with [name] about his current needs and medical issues Spoke with Fletcher about his weekly dialysis treatments. He said his weekly dialysis treatments are going well. He did not mention any transport problems going to and from dialysis treatments. He discussed that he sometimes gets sad that he cannot do some of the activities he once did. He said his dialysis treatments are required and he has to have them to survive. He said he is on dialysis machine for about 4 hours each time he receives dialysis Discussed support of client's spouse Client has stress related to financial needs. He said finances are difficult.  His wife does work but they have to plan well  how to spend their  household income.  Getting an unexpected bill can be difficult for client to manage.  LCSW talked with client about Facilities manager for financial planning and strategies. Client said he had talked with such agencies previously and did not want to talk with such an agency since they would suggest that he would give up (possibly) use of his credit cards. Client did not seem interested in talking with Credit Counseling Agency Discussed client home access. He has a ramp to use to go in and out of his home Discussed step life in his home. Client has step lift and is making monthly payments on step lift. Client said he is not driving at present. He did not mention any problems going to and from dialysis treatments Reminded client of program support with RN, LCSW, Pharmacist Discussed skin issues. Client said he  has been having some rash or outbreak on his back but he has been treating this skin need at his home. Discussed client appetite. Encouraged [name] to call LCSW as needed for SW support at [phone] [place] was appreciative of call from LCSW today           Our next appointment is by telephone on 01/19/24 at 10:00 AM   Please call the care guide team at [phone] if you need to cancel or reschedule your appointment.   If you are experiencing a Mental Health or Behavioral Health Crisis or need someone to talk to, please go to [facility] [address], [place] [phone])   The patient verbalized understanding of instructions, educational materials, and care plan provided today and DECLINED offer to receive copy of patient instructions, educational materials, and care plan.   The patient has been provided with contact information for the care management team and has been advised to call with any health related questions or concerns.    Lorna Few  MSW, LCSW [place] Licensed Clinical Social Worker Direct Dial:  [phone] Fax:  [phone] Website:  [email or web address]

## 2023-12-29 ENCOUNTER — Ambulatory Visit (HOSPITAL_COMMUNITY)
Admission: RE | Admit: 2023-12-29 | Discharge: 2023-12-29 | Disposition: A | Discharge status: Home / Self Care | Payer: Medicare Other | Attending: Internal Medicine | Admitting: Internal Medicine

## 2023-12-29 ENCOUNTER — Encounter (HOSPITAL_COMMUNITY)
Admission: RE | Disposition: A | Discharge status: Home / Self Care | Payer: Self-pay | Source: Home / Self Care | Attending: Internal Medicine

## 2023-12-29 ENCOUNTER — Other Ambulatory Visit: Payer: Self-pay

## 2023-12-29 DIAGNOSIS — I120 Hypertensive chronic kidney disease with stage 5 chronic kidney disease or end stage renal disease: Secondary | ICD-10-CM | POA: Diagnosis not present

## 2023-12-29 DIAGNOSIS — T82858A Stenosis of vascular prosthetic devices, implants and grafts, initial encounter: Secondary | ICD-10-CM | POA: Insufficient documentation

## 2023-12-29 DIAGNOSIS — Z992 Dependence on renal dialysis: Secondary | ICD-10-CM | POA: Insufficient documentation

## 2023-12-29 DIAGNOSIS — Z833 Family history of diabetes mellitus: Secondary | ICD-10-CM | POA: Insufficient documentation

## 2023-12-29 DIAGNOSIS — E785 Hyperlipidemia, unspecified: Secondary | ICD-10-CM | POA: Insufficient documentation

## 2023-12-29 DIAGNOSIS — I2510 Atherosclerotic heart disease of native coronary artery without angina pectoris: Secondary | ICD-10-CM | POA: Insufficient documentation

## 2023-12-29 DIAGNOSIS — Z7722 Contact with and (suspected) exposure to environmental tobacco smoke (acute) (chronic): Secondary | ICD-10-CM | POA: Insufficient documentation

## 2023-12-29 DIAGNOSIS — E1122 Type 2 diabetes mellitus with diabetic chronic kidney disease: Secondary | ICD-10-CM | POA: Diagnosis not present

## 2023-12-29 DIAGNOSIS — N186 End stage renal disease: Secondary | ICD-10-CM | POA: Insufficient documentation

## 2023-12-29 DIAGNOSIS — I871 Compression of vein: Secondary | ICD-10-CM | POA: Insufficient documentation

## 2023-12-29 DIAGNOSIS — Y832 Surgical operation with anastomosis, bypass or graft as the cause of abnormal reaction of the patient, or of later complication, without mention of misadventure at the time of the procedure: Secondary | ICD-10-CM | POA: Insufficient documentation

## 2023-12-29 HISTORY — PX: A/V FISTULAGRAM: CATH118298

## 2023-12-29 HISTORY — PX: VENOUS ANGIOPLASTY: CATH118376

## 2023-12-29 SURGERY — A/V FISTULAGRAM
Anesthesia: LOCAL

## 2023-12-29 MED ORDER — HEPARIN (PORCINE) IN NACL 1000-0.9 UT/500ML-% IV SOLN
INTRAVENOUS | Status: DC | PRN
  Administered 2023-12-29: 500 mL

## 2023-12-29 MED ORDER — MIDAZOLAM HCL 2 MG/2ML IJ SOLN
INTRAMUSCULAR | Status: AC
  Filled 2023-12-29: qty 2

## 2023-12-29 MED ORDER — FENTANYL CITRATE (PF) 100 MCG/2ML IJ SOLN
INTRAMUSCULAR | Status: AC
  Filled 2023-12-29: qty 2

## 2023-12-29 MED ORDER — MIDAZOLAM HCL 2 MG/2ML IJ SOLN
INTRAMUSCULAR | Status: DC | PRN
  Administered 2023-12-29: 1 mg via INTRAVENOUS

## 2023-12-29 MED ORDER — IODIXANOL 320 MG/ML IV SOLN
INTRAVENOUS | Status: DC | PRN
  Administered 2023-12-29: 10 mL via INTRAVENOUS

## 2023-12-29 MED ORDER — FENTANYL CITRATE (PF) 100 MCG/2ML IJ SOLN
INTRAMUSCULAR | Status: DC | PRN
  Administered 2023-12-29: 50 ug via INTRAVENOUS

## 2023-12-29 MED ORDER — LIDOCAINE HCL (PF) 1 % IJ SOLN
INTRAMUSCULAR | Status: AC
  Filled 2023-12-29: qty 30

## 2023-12-29 MED ORDER — LIDOCAINE HCL (PF) 1 % IJ SOLN
INTRAMUSCULAR | Status: DC | PRN
  Administered 2023-12-29 (×2): 2 mL via SUBCUTANEOUS

## 2023-12-29 SURGICAL SUPPLY — 10 items
BALLN ATHLETIS 6X40X75 (BALLOONS) ×2 IMPLANT
BALLN MUSTANG 9X40X75 (BALLOONS) ×2 IMPLANT
BALLOON ATHLETIS 6X40X75 (BALLOONS) IMPLANT
BALLOON MUSTANG 9X40X75 (BALLOONS) IMPLANT
CATH BEACON 5 .035 65 KMP TIP (CATHETERS) IMPLANT
GUIDEWIRE ANGLED .035 180CM (WIRE) IMPLANT
KIT MICROPUNCTURE NIT STIFF (SHEATH) IMPLANT
PACK EP LF (CUSTOM PROCEDURE TRAY) IMPLANT
SHEATH PINNACLE R/O II 6F 4CM (SHEATH) IMPLANT
SYR MEDALLION 10ML (SYRINGE) IMPLANT

## 2023-12-29 NOTE — Op Note (Addendum)
 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   He states last intervention was 07/2023 intrafistula PTA at [facility] (records not currently available).  On exam the AVF is pulsatile with weak bruit and thrill with 2 moderate, firm aneurysms with normal overlying skin.    Summary:  1)      The patient had successful angioplasty of significant 90% stenosis in the cephalic vein arch involving the confluence (6 Athletis FE ~24 atm),  60% distal edge of previously placed arch stent (9 Mustang FE 18 ATM) and 50% intrafistula (6 Athletis FE 18 ATM).    2)      The body of the cephalic vein fistula was patent with good flows. 3)      The centrals were widely patent. 4)      This left BCF remains amenable to future percutaneous intervention but would not be a candidate for a percutaneous thrombectomy due to heavily calcified and aneurysms.  Description of procedure: The arm was prepped and draped in the usual sterile fashion. The left upper arm brachial cephalic fistula was cannulated (16109) with an 21G micropucture needle directed in an antegrade direction. A guidewire was inserted and exchanged for a 7Fr sheath. Contrast [phone]) injection via the side port of the sheath was performed. 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  There was a 90% stenosis of the proximal end of the cephalic arch involving the confluence, which is not stented.    The left centrals were patent.  The wire was advanced centrally without any difficulty. An 6 mm Athletis angioplasty balloon was inserted over a glide wire and positioned at the more proximal cephalic vein arch stenosis. Venous angioplasty (91478) was carried out to 24 ATM with FULL effacement of the waist on the balloon at the arch  lesion.  Next, a 9mm Mustang angioplasty balloon was inserted over the wire and positioned at the distal edge of the cephalic vein stenosis.  Venous angioplasty was carried out to 18 ATM with full effacement.  Follow up angiogram at the arch showed < 10% stenosis and no extravasation.  Next, attention was turned to the intrafistula stenosis.  The 6mm Athletis angioplasty balloon was passed over the wire to the site.  Venous angioplasty was completed to full effacement at 18 ATM.    Final angiogram showed <10% residual stenosis. The flow of contrast was quicker and the fistula was markedly less pulsatile.  Hemostasis: A 3-0 ethilon purse string suture was placed at the cannulation site on removal of the sheath.  Sedation: 1mg  Versed, Fentanyl. Sedation time. 13 minutes  Contrast. 10 mL  Monitoring: Because of the patient's comorbid conditions and sedation during the procedure, continuous EKG monitoring and O2 saturation monitoring was performed throughout the procedure by the RN. There were no abnormal arrhythmias encountered.  Complications: None.   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 Continue to cannulate the fistula with 15G needles.  2. Refer back for problems with flows or other clinical problems, low threshold to do so. 3. Remove the suture next treatment.   Discharge: The patient was discharged home in stable  condition. The patient was given education regarding the care of the dialysis access AVF and specific instructions in case of any problems.

## 2023-12-29 NOTE — H&P (Addendum)
 [facility]  INPATIENT CONSULTATION  Reason for Consultation: Prolonged bleeding, low AF Requesting Provider: Dr. Signe Colt  HPI: [name] is an 58 y.o. male with ESRD on HD, HTN, HL, CAD, DM who presents for evaluation of his hemodialysis access due to prolonged bleeding and low AF.   He has a RUE AVF placed 12y ago.  His last intervention he notes was 07/2023 at [facility] with PTA mid AV access.  He has prolonged bleeding from is access after needles removed and actually had to put a bandaid back on access this AM when he took dressings off and it started oozing again.   Denies any steal symptoms.   He is NPO with a driver and no reported contrast allergy.   PMH: Past Medical History:  Diagnosis Date   Acute osteomyelitis of right foot (HCC) 05/15/2019   Anemia 06/23/2019   Aortic stenosis    Bursitis    right shoulder   Complication of anesthesia    Coronary artery disease    Diabetes with renal manifestations(250.4) 08/23/2013   Diabetic infection of right foot (HCC)    Diabetic retinopathy associated with type 2 diabetes mellitus (HCC) 06/23/2019   ESRD on hemodialysis (HCC) 07/18/2011   Essential hypertension 10/18/2007   Qualifier: Diagnosis of  By: Everardo All MD, Gregary Signs A    History of blood transfusion    HYPERLIPIDEMIA 05/16/2008   Qualifier: Diagnosis of  By: Jonny Ruiz MD, Len Blalock    MRSA INFECTION 10/18/2007   Qualifier: Diagnosis of  By: Tyrone Apple, Lucy     Other forms of retinal detachment(361.89) 06/23/2019   PAD (peripheral artery disease) (HCC)    PONV (postoperative nausea and vomiting)    Prolonged QT interval    Pulmonary hypertension (HCC)    Secondary hyperparathyroidism (HCC) 06/23/2019   Sepsis (HCC) 05/14/2019   PSH: Past Surgical History:  Procedure Laterality Date   ABDOMINAL AORTOGRAM N/A 05/17/2019   Procedure: ABDOMINAL AORTOGRAM;  Surgeon: Sherren Kerns, MD;  Location: [facility] INVASIVE CV LAB;  Service: Cardiovascular;  Laterality: N/A;    AMPUTATION TOE Right 05/14/2019   Procedure: PARTIAL AMPUTATION SECOND TOE RIGHT FOOT;  Surgeon: Park Liter, DPM;  Location: [facility] OR;  Service: Podiatry;  Laterality: Right;   AMPUTATION TOE Right 05/23/2019   Procedure: AMPUTATION OF SECOND TOE METATARSAL PHALANGEAL JOINT;  Surgeon: Park Liter, DPM;  Location: [facility] OR;  Service: Podiatry;  Laterality: Right;   AMPUTATION TOE Right 05/27/2019   Procedure: Amputation Toe;  Surgeon: Park Liter, DPM;  Location: [facility] OR;  Service: Podiatry;  Laterality: Right;  right third toe   APPLICATION OF WOUND VAC Right 05/27/2019   Procedure: Application Of Wound Vac;  Surgeon: Park Liter, DPM;  Location: [facility] OR;  Service: Podiatry;  Laterality: Right;   AV FISTULA PLACEMENT  06-18-11   Right brachiocephalic AVF   BONE BIOPSY Right 05/14/2019   Procedure: OPEN SUPERFICIAL BONE BIOPSY GREAT TOE;  Surgeon: Park Liter, DPM;  Location: [facility] OR;  Service: Podiatry;  Laterality: Right;   CARDIAC CATHETERIZATION     CORONARY BALLOON ANGIOPLASTY N/A 09/24/2020   Procedure: CORONARY BALLOON ANGIOPLASTY;  Surgeon: Swaziland, Peter M, MD;  Location: [facility] INVASIVE CV LAB;  Service: Cardiovascular;  Laterality: N/A;   CORONARY PRESSURE/FFR STUDY N/A 09/24/2020   Procedure: INTRAVASCULAR PRESSURE WIRE/FFR STUDY;  Surgeon: Swaziland, Peter M, MD;  Location: [facility] INVASIVE CV LAB;  Service: Cardiovascular;  Laterality: N/A;   CORONARY STENT INTERVENTION N/A 09/24/2020  Procedure: CORONARY STENT INTERVENTION;  Surgeon: Swaziland, Peter M, MD;  Location: [facility] INVASIVE CV LAB;  Service: Cardiovascular;  Laterality: N/A;   EYE SURGERY     left eye for Laser, diabetic retinopathy   FISTULOGRAM Right 11/06/2011   Procedure: FISTULOGRAM;  Surgeon: Fransisco Hertz, MD;  Location: [facility] CATH LAB;  Service: Cardiovascular;  Laterality: Right;   HEMATOMA EVACUATION Right Feb. 25, 2014   HEMATOMA EVACUATION Right 12/14/2012   Procedure: EVACUATION HEMATOMA;  Surgeon: Fransisco Hertz, MD;  Location:  [facility] OR;  Service: Vascular;  Laterality: Right;   INCISION AND DRAINAGE Right 05/23/2019   Procedure: INCISION AND DRAINAGE OF RIGHT FOOT DEEP SPACE ABSCESS;  Surgeon: Park Liter, DPM;  Location: [facility] OR;  Service: Podiatry;  Laterality: Right;   INJECTION OF SILICONE OIL Left 01/09/2023   Procedure: INJECTION OF SILICONE OIL;  Surgeon: Edmon Crape, MD;  Location: [facility] OR;  Service: Ophthalmology;  Laterality: Left;   INSERTION OF DIALYSIS CATHETER  12/14/2012   Procedure: INSERTION OF DIALYSIS CATHETER;  Surgeon: Fransisco Hertz, MD;  Location: [name] OR;  Service: Vascular;;   INSERTION OF DIALYSIS CATHETER Right 01/07/2021   Procedure: INSERTION OF DIALYSIS CATHETER;  Surgeon: Sherren Kerns, MD;  Location: [facility] OR;  Service: Vascular;  Laterality: Right;   INSERTION OF DIALYSIS CATHETER Left 01/08/2021   Procedure: INSERTION OF LEFT INTERNAL JUGULAR DIALYSIS CATHETER, tunneled;  Surgeon: Chuck Hint, MD;  Location: [facility] OR;  Service: Vascular;  Laterality: Left;   IRRIGATION AND DEBRIDEMENT FOOT Right 05/25/2019   Procedure: INCISION AND DRAINAGE FOOT;  Surgeon: Park Liter, DPM;  Location: [facility] OR;  Service: Podiatry;  Laterality: Right;   IRRIGATION AND DEBRIDEMENT FOOT Right 05/27/2019   Procedure: IRRIGATION AND DEBRIDEMENT FOOT;  Surgeon: Park Liter, DPM;  Location: [facility] OR;  Service: Podiatry;  Laterality: Right;   LASER PHOTO ABLATION Left 01/09/2023   Procedure: LASER PHOTO ABLATION;  Surgeon: Edmon Crape, MD;  Location: [facility] OR;  Service: Ophthalmology;  Laterality: Left;   LEFT HEART CATH AND CORONARY ANGIOGRAPHY N/A 09/24/2020   Procedure: LEFT HEART CATH AND CORONARY ANGIOGRAPHY;  Surgeon: Swaziland, Peter M, MD;  Location: [facility] INVASIVE CV LAB;  Service: Cardiovascular;  Laterality: N/A;   LOWER EXTREMITY ANGIOGRAPHY Bilateral 05/17/2019   Procedure: LOWER EXTREMITY ANGIOGRAPHY;  Surgeon: Sherren Kerns, MD;  Location: [facility] INVASIVE CV LAB;  Service: Cardiovascular;  Laterality:  Bilateral;   PARS PLANA VITRECTOMY Left 01/09/2023   Procedure: PARS PLANA VITRECTOMY WITH 25 GAUGE AND REPAIR OF RETINAL DETACHMENT;  Surgeon: Edmon Crape, MD;  Location: [facility] OR;  Service: Ophthalmology;  Laterality: Left;   REMOVAL OF A DIALYSIS CATHETER Right 01/08/2021   Procedure: REMOVAL OF TEMPORARY DIALYSIS CATHETER, LEFT FEMORAL VEIN;  Surgeon: Chuck Hint, MD;  Location: [facility] OR;  Service: Vascular;  Laterality: Right;   REVISON OF ARTERIOVENOUS FISTULA Right 12/14/2012   Procedure: REVISON OF upper arm ARTERIOVENOUS FISTULA using 71mmx10cm gortex graft;  Surgeon: Fransisco Hertz, MD;  Location: [facility] OR;  Service: Vascular;  Laterality: Right;   REVISON OF ARTERIOVENOUS FISTULA Right 12/27/2020   Procedure: RIGHT UPPER EXTREMITY ARTERIOVENOUS FISTULA REVISiON;  Surgeon: Leonie Douglas, MD;  Location: [facility] OR;  Service: Vascular;  Laterality: Right;  PERIPHERAL NERVE BLOCK   RIGHT/LEFT HEART CATH AND CORONARY ANGIOGRAPHY N/A 08/07/2020   Procedure: RIGHT/LEFT HEART CATH AND CORONARY ANGIOGRAPHY;  Surgeon: Swaziland, Peter M, MD;  Location: [facility] INVASIVE CV LAB;  Service: Cardiovascular;  Laterality: N/A;   SOFT TISSUE MASS EXCISION     Right arm, Left leg  for MRSA infection     Past Medical History:  Diagnosis Date   Acute osteomyelitis of right foot (HCC) 05/15/2019   Anemia 06/23/2019   Aortic stenosis    Bursitis    right shoulder   Complication of anesthesia    Coronary artery disease    Diabetes with renal manifestations(250.4) 08/23/2013   Diabetic infection of right foot (HCC)    Diabetic retinopathy associated with type 2 diabetes mellitus (HCC) 06/23/2019   ESRD on hemodialysis (HCC) 07/18/2011   Essential hypertension 10/18/2007   Qualifier: Diagnosis of  By: Everardo All MD, Gregary Signs A    History of blood transfusion    HYPERLIPIDEMIA 05/16/2008   Qualifier: Diagnosis of  By: Jonny Ruiz MD, Len Blalock    MRSA INFECTION 10/18/2007   Qualifier: Diagnosis of  By: Samara Snide      Other forms of retinal detachment(361.89) 06/23/2019   PAD (peripheral artery disease) (HCC)    PONV (postoperative nausea and vomiting)    Prolonged QT interval    Pulmonary hypertension (HCC)    Secondary hyperparathyroidism (HCC) 06/23/2019   Sepsis (HCC) 05/14/2019    Medications:  I have reviewed the patient's current medications.  Medications Prior to Admission  Medication Sig Dispense Refill   aspirin EC 81 MG tablet Take 1 tablet (81 mg total) by mouth daily. 90 tablet 3   EPINEPHrine (EPIPEN 2-PAK) 0.3 mg/0.3 mL IJ SOAJ injection Inject 0.3 mg into the muscle as needed for anaphylaxis.     hydrOXYzine (VISTARIL) 50 MG capsule Take 50 mg by mouth every 6 (six) hours as needed for anxiety.     metoprolol tartrate (LOPRESSOR) 100 MG tablet Take 100 mg by mouth daily.     multivitamin (RENA-VIT) TABS tablet Take 1 tablet by mouth daily with lunch.     neomycin-polymyxin b-dexamethasone (MAXITROL) 3.5-10000-0.1 OINT Place 1 Application into the left eye at bedtime.     nitroGLYCERIN (NITROSTAT) 0.4 MG SL tablet Place 0.4 mg under the tongue every 5 (five) minutes as needed for chest pain.     ofloxacin (OCUFLOX) 0.3 % ophthalmic solution Place 1 drop into the right eye 4 (four) times daily.     pantoprazole (PROTONIX) 40 MG tablet Take 40 mg by mouth daily as needed (ACID REFLUX).     prednisoLONE acetate (PRED FORTE) 1 % ophthalmic suspension Place 1 drop into the right eye 4 (four) times daily.     pregabalin (LYRICA) 50 MG capsule Take 50 mg by mouth at bedtime as needed (PAIN).     sevelamer carbonate (RENVELA) 800 MG tablet Take 1,600 mg by mouth 3 (three) times daily with meals.     sodium zirconium cyclosilicate (LOKELMA) 10 g PACK packet Take 10 g by mouth See admin instructions. Non dialysis days(Tuesday,Thursday,Saturday and Sunday) as needed for high potassium     Tenapanor HCl, CKD, (XPHOZAH) 30 MG TABS Take 30 mg by mouth in the morning and at bedtime.     traZODone  (DESYREL) 150 MG tablet Take 150 mg by mouth at bedtime as needed for sleep.     triamcinolone cream (KENALOG) 0.1 % Apply 1 Application topically 2 (two) times daily as needed (ITCHING).     Continuous Blood Gluc Sensor (FREESTYLE LIBRE 14 DAY SENSOR) MISC See admin instructions.      ALLERGIES:   Allergies  Allergen Reactions   Crestor [Rosuvastatin]  Other reaction(s): Muscle Pain   Lipitor [Atorvastatin]     Other reaction(s): Muscle Pain   Amoxicillin Itching    Did it involve swelling of the face/tongue/throat, SOB, or low BP? Unknown Did it involve sudden or severe rash/hives, skin peeling, or any reaction on the inside of your mouth or nose? Unknown Did you need to seek medical attention at a hospital or doctor's office? Unknown When did it last happen?      20 years ago If all above answers are "NO", may proceed with cephalosporin use.    Chlorhexidine Hives    Patient has blistering   Hydromorphone Itching    Patient states he may take with benadryl. Makes feet itch.   Percocet [Oxycodone-Acetaminophen] Itching and Other (See Comments)    Legs only. Itching and burning feeling. Can tolerate with hydroxyzine    Morphine     Other reaction(s): rash/itching All narcotics per pt   Gabapentin Nausea And Vomiting    Stomach issues     FAM HX: Family History  Problem Relation Age of Onset   Diabetes Mother     Social History:   reports that he has never smoked. He has been exposed to tobacco smoke. He has never used smokeless tobacco. He reports that he does not drink alcohol and does not use drugs.  ROS: 12 system relevant ROS neg except per HPI above  Blood pressure (!) 142/90, pulse 75, resp. rate 14, SpO2 100%. PHYSICAL EXAM: Gen: chronically ill but comfortable appearing  Eyes: EOMI ENT: class 3 airway CV: RRR  Lungs: clear on RA Extr: RUE AVG/AVF with several moderate pseudoaneurysms, pulsatile, poor thrill and puffy bruit Neuro: AOx3, conversant     No results found for this or any previous visit (from the past 48 hours).  No results found.  Assessment/Plan [name] is an 58 y.o. male with ESRD on HD, HTN, HL, CAD, DM who presents for evaluation of his hemodialysis access due to prolonged bleeding and low AF.   **ESRD with HD access malfunction:  Physical exam evidence for outflow stenosis which is causing the prolonged bleeding.  After discussing risks/benefits/alternatives he is agreeable to proceeding with angiogram with angioplasty if stenoses identified.  Use conscious sedation for his comfort.    Tyler Pita 12/29/2023, 7:51 AM

## 2023-12-29 NOTE — Discharge Instructions (Signed)

## 2023-12-30 ENCOUNTER — Encounter (HOSPITAL_COMMUNITY): Payer: Self-pay | Admitting: Internal Medicine

## 2024-01-14 ENCOUNTER — Encounter: Payer: Self-pay | Admitting: Nurse Practitioner

## 2024-01-19 ENCOUNTER — Ambulatory Visit: Payer: Self-pay | Admitting: Licensed Clinical Social Worker

## 2024-01-19 NOTE — Patient Outreach (Signed)
 Care Coordination   Follow Up Visit Note   01/19/2024 Name: MAGUIRE SIME MRN: [id number] DOB: 1966/06/25  [name] is a 58 y.o. year old male who sees [name], MD for primary care. I spoke with  [name] by phone today.  What matters to the patients health and wellness today?  Patient Stated he is concerned about finances. sometimes it is hard to pay monthly bills     Goals Addressed               This Visit's Progress     Patient Stated he is concerned about finances. sometimes it is hard to pay monthly bills (pt-stated)        Interventions:  LCSW spoke via phone today  with [name] about his current needs and medical issues Spoke with Giomar about his weekly dialysis treatments. He said his weekly dialysis treatments are going well. He did not mention any transport problems going to and from dialysis treatments. He discussed that he sometimes gets sad that he cannot do some of the activities he once did. He said his dialysis treatments are required and he has to have them to survive. He said he is on dialysis machine for about 4 hours each time he receives dialysis Discussed support of client's spouse Client has stress related to financial needs. He said finances are difficult.  His wife does work but they have to plan well  how to spend their  household income. Discussed client daily functions in home environment. He has a ramp he uses to go in and out of his home. He has wheelchair he uses to help him ambulate. He has stair lift he uses in his home as needed Client said he has no transport problems getting to and from dialysis Provided counseling support for client Reminded client of program support with RN, LCSW, Pharmacist Discussed skin issues. Client did not mention any skin issues Discussed client appetite. Discussed pain issues of client Discussed client support with PCP Client said he has been on dialysis for about 12 years.  Encouraged [name] to call  LCSW as needed for SW support at [phone] [place] was appreciative of call from LCSW today          SDOH assessments and interventions completed:  Yes  SDOH Interventions Today    Flowsheet Row Most Recent Value  SDOH Interventions   Depression Interventions/Treatment  Counseling  Physical Activity Interventions Other (Comments)  [uses wheelchair as needed to ambulate]  Stress Interventions Provide Counseling        Care Coordination Interventions:  Yes, provided   Interventions Today    Flowsheet Row Most Recent Value  Chronic Disease   Chronic disease during today's visit Other  [spoke with client about client needs]  General Interventions   General Interventions Discussed/Reviewed General Interventions Discussed, Community Resources  Education Interventions   Education Provided Provided Education  Provided Verbal Education On [employer]  Mental Health Interventions   Mental Health Discussed/Reviewed Coping Strategies  [trying to cope with medical needs and dialysis needs]  Nutrition Interventions   Nutrition Discussed/Reviewed Nutrition Discussed  Pharmacy Interventions   Pharmacy Dicussed/Reviewed Pharmacy Topics Discussed  Safety Interventions   Safety Discussed/Reviewed Fall Risk        Follow up plan: LCSW has provided client with LCSW name and phone number. LCSW has encouraged Bravlio to call LCSW as needed for SW support at [phone].   Encounter Outcome:  Patient Visit Completed  Lorna Few  MSW, LCSW [place] Licensed Clinical Social Worker Direct Dial:  [phone] Fax:  [phone] Website:  [email or web address]

## 2024-01-19 NOTE — Patient Instructions (Signed)
 Visit Information  Thank you for taking time to visit with me today. Please don't hesitate to contact me if I can be of assistance to you.   Following are the goals we discussed today:   Goals Addressed               This Visit's Progress     Patient Stated he is concerned about finances. sometimes it is hard to pay monthly bills (pt-stated)        Interventions:  LCSW spoke via phone today  with [name] about his current needs and medical issues Spoke with [name] about his weekly dialysis treatments. He said his weekly dialysis treatments are going well. He did not mention any transport problems going to and from dialysis treatments. He discussed that he sometimes gets sad that he cannot do some of the activities he once did. He said his dialysis treatments are required and he has to have them to survive. He said he is on dialysis machine for about 4 hours each time he receives dialysis Discussed support of client's spouse Client has stress related to financial needs. He said finances are difficult.  His wife does work but they have to plan well  how to spend their  household income. Discussed client daily functions in home environment. He has a ramp he uses to go in and out of his home. He has wheelchair he uses to help him ambulate. He has stair lift he uses in his home as needed Client said he has no transport problems getting to and from dialysis Provided counseling support for client Reminded client of program support with RN, LCSW, Pharmacist Discussed skin issues. Client did not mention any skin issues Discussed client appetite. Discussed pain issues of client Discussed client support with PCP Client said he has been on dialysis for about 12 years.  Encouraged [name] to call LCSW as needed for SW support at [phone] Early was appreciative of call from LCSW today         LCSW has provided client with LCSW name and phone number. LCSW has encouraged [name] to call LCSW  as needed for SW support for client at [phone]  Please call the care guide team at [phone] if you need to cancel or reschedule your appointment.   If you are experiencing a Mental Health or Behavioral Health Crisis or need someone to talk to, please go to [facility] [address] [place] [phone])   The patient verbalized understanding of instructions, educational materials, and care plan provided today and DECLINED offer to receive copy of patient instructions, educational materials, and care plan.   The patient has been provided with contact information for the care management team and has been advised to call with any health related questions or concerns.    [name]  MSW, LCSW [place] Licensed Clinical Social Worker Direct Dial:  [phone] Fax:  [phone] Website:  [email or web address]

## 2024-03-09 NOTE — Progress Notes (Signed)
 +    03/09/2024 [name] [id number] 1966/03/14   CHIEF COMPLAINT: Abdominal gas, loose stools, vomiting  HISTORY OF PRESENT ILLNESS: [name] is a 58 year old male with a past medical history of arthritis, depression, hypertension, hyperlipidemia, aortic stenosis, coronary artery disease s/p stent placement x 2 in 09/2020 and x 1 in 03/2021 with NSTEMI  03/2021 and 08/2021, CHF, left foot osteomyelitis s/p left foot metatarsal amputation 04/2022, peripheral artery disease s/p left BKA 06/2022, pulmonary hypertension, DM type II, ESRD on HD on M/W/F, diabetic retinopathy and chronic constipation.  He presents today for further evaluation regarding abdominal gas which sometimes results in vomiting, constipation and intermittent loose stools. He is accompanied by his wife.  When he is having active constipation he feels a lot of pressure with a noticeable bulge to the right groin area with known inguinal hernia, he sometimes manually pushes on the hernia to facilitate passing a bowel movement.  He often feels gas buildup in his intestines which sometimes results in vomiting nonbloody nonbilious emesis. He also endorses having loose stools  all night long if he eats dinner past 4 PM or eats any fatty/fried foods, onions or dairy products which abates after he takes psyllium husk in the morning. As long as he eats before 4 PM and avoids all the food triggers, he passes a normal bowel movement. No bloody or black stools. He sometimes has nausea before passing gas he denies ever having a screening colonoscopy.  Brother with history of colon polyps.  No known family history of colorectal cancer.  He has routine hemoglobin levels done during dialysis.  Significant history of CAD as noted above.  He stated that he has not seen his cardiologist for several years.  No chest pain or shortness of breath.     Latest Ref Rng & Units 06/02/2023    5:00 PM 01/09/2023    1:41 PM 09/15/2021    3:10 AM  CBC  WBC 3.8  - 10.8 Thousand/uL 5.1   5.3   Hemoglobin 13.2 - 17.1 g/dL 78.2  95.6  9.0   Hematocrit 38.5 - 50.0 % 32.3  37.0  28.9   Platelets 140 - 400 Thousand/uL 206   303        Latest Ref Rng & Units 06/02/2023    5:00 PM 01/09/2023    1:41 PM 09/15/2021    3:10 AM  CMP  Glucose 65 - 99 mg/dL 95  213  086   BUN 7 - 25 mg/dL 54  38  37   Creatinine 0.70 - 1.30 mg/dL 57.84  6.96  2.95   Sodium 135 - 146 mmol/L 136  138  136   Potassium 3.5 - 5.3 mmol/L 4.6  5.1  4.3   Chloride 98 - 110 mmol/L 93  101  96   CO2 20 - 32 mmol/L 20   29   Calcium  8.6 - 10.3 mg/dL 9.6   8.9   Total Protein 6.1 - 8.1 g/dL 6.6     Total Bilirubin 0.2 - 1.2 mg/dL 1.1     AST 10 - 35 U/L 11     ALT 9 - 46 U/L 6       Past Medical History:  Diagnosis Date   Acute osteomyelitis of right foot (HCC) 05/15/2019   Anemia 06/23/2019   Aortic stenosis    Bursitis    right shoulder   Complication of anesthesia    Coronary artery disease  Diabetes with renal manifestations(250.4) 08/23/2013   Diabetic infection of right foot (HCC)    Diabetic retinopathy associated with type 2 diabetes mellitus (HCC) 06/23/2019   ESRD on hemodialysis (HCC) 07/18/2011   Essential hypertension 10/18/2007   Qualifier: Diagnosis of  By: Washington Hacker MD, Kaaren Ora A    History of blood transfusion    HYPERLIPIDEMIA 05/16/2008   Qualifier: Diagnosis of  By: Autry Legions MD, Alveda Aures    MRSA INFECTION 10/18/2007   Qualifier: Diagnosis of  By: Ana Balling, Lucy     Other forms of retinal detachment(361.89) 06/23/2019   PAD (peripheral artery disease) (HCC)    PONV (postoperative nausea and vomiting)    Prolonged QT interval    Pulmonary hypertension (HCC)    Secondary hyperparathyroidism (HCC) 06/23/2019   Sepsis (HCC) 05/14/2019   Past Surgical History:  Procedure Laterality Date   A/V FISTULAGRAM N/A 12/29/2023   Procedure: A/V Fistulagram;  Surgeon: Baron Border, MD;  Location: [facility] INVASIVE CV LAB;  Service: Cardiovascular;  Laterality: N/A;    ABDOMINAL AORTOGRAM N/A 05/17/2019   Procedure: ABDOMINAL AORTOGRAM;  Surgeon: Richrd Char, MD;  Location: [facility] INVASIVE CV LAB;  Service: Cardiovascular;  Laterality: N/A;   AMPUTATION TOE Right 05/14/2019   Procedure: PARTIAL AMPUTATION SECOND TOE RIGHT FOOT;  Surgeon: Camilo Cella, DPM;  Location: [facility] OR;  Service: Podiatry;  Laterality: Right;   AMPUTATION TOE Right 05/23/2019   Procedure: AMPUTATION OF SECOND TOE METATARSAL PHALANGEAL JOINT;  Surgeon: Camilo Cella, DPM;  Location: [facility] OR;  Service: Podiatry;  Laterality: Right;   AMPUTATION TOE Right 05/27/2019   Procedure: Amputation Toe;  Surgeon: Camilo Cella, DPM;  Location: [facility] OR;  Service: Podiatry;  Laterality: Right;  right third toe   APPLICATION OF WOUND VAC Right 05/27/2019   Procedure: Application Of Wound Vac;  Surgeon: Camilo Cella, DPM;  Location: [facility] OR;  Service: Podiatry;  Laterality: Right;   AV FISTULA PLACEMENT  06-18-11   Right brachiocephalic AVF   BONE BIOPSY Right 05/14/2019   Procedure: OPEN SUPERFICIAL BONE BIOPSY GREAT TOE;  Surgeon: Camilo Cella, DPM;  Location: [facility] OR;  Service: Podiatry;  Laterality: Right;   CARDIAC CATHETERIZATION     CORONARY BALLOON ANGIOPLASTY N/A 09/24/2020   Procedure: CORONARY BALLOON ANGIOPLASTY;  Surgeon: Swaziland, Peter M, MD;  Location: [facility] INVASIVE CV LAB;  Service: Cardiovascular;  Laterality: N/A;   CORONARY PRESSURE/FFR STUDY N/A 09/24/2020   Procedure: INTRAVASCULAR PRESSURE WIRE/FFR STUDY;  Surgeon: Swaziland, Peter M, MD;  Location: [facility] INVASIVE CV LAB;  Service: Cardiovascular;  Laterality: N/A;   CORONARY STENT INTERVENTION N/A 09/24/2020   Procedure: CORONARY STENT INTERVENTION;  Surgeon: Swaziland, Peter M, MD;  Location: [facility] INVASIVE CV LAB;  Service: Cardiovascular;  Laterality: N/A;   EYE SURGERY     left eye for Laser, diabetic retinopathy   FISTULOGRAM Right 11/06/2011   Procedure: FISTULOGRAM;  Surgeon: Arvil Lauber, MD;  Location: [facility] CATH LAB;  Service:  Cardiovascular;  Laterality: Right;   HEMATOMA EVACUATION Right Feb. 25, 2014   HEMATOMA EVACUATION Right 12/14/2012   Procedure: EVACUATION HEMATOMA;  Surgeon: Arvil Lauber, MD;  Location: [facility] OR;  Service: Vascular;  Laterality: Right;   INCISION AND DRAINAGE Right 05/23/2019   Procedure: INCISION AND DRAINAGE OF RIGHT FOOT DEEP SPACE ABSCESS;  Surgeon: Camilo Cella, DPM;  Location: [facility] OR;  Service: Podiatry;  Laterality: Right;   INJECTION OF SILICONE OIL Left 01/09/2023   Procedure: INJECTION OF SILICONE OIL;  Surgeon: Shon Downing, MD;  Location: [facility] OR;  Service: Ophthalmology;  Laterality: Left;   INSERTION OF DIALYSIS CATHETER  12/14/2012   Procedure: INSERTION OF DIALYSIS CATHETER;  Surgeon: Arvil Lauber, MD;  Location: [facility] OR;  Service: Vascular;;   INSERTION OF DIALYSIS CATHETER Right 01/07/2021   Procedure: INSERTION OF DIALYSIS CATHETER;  Surgeon: Richrd Char, MD;  Location: [facility] OR;  Service: Vascular;  Laterality: Right;   INSERTION OF DIALYSIS CATHETER Left 01/08/2021   Procedure: INSERTION OF LEFT INTERNAL JUGULAR DIALYSIS CATHETER, tunneled;  Surgeon: Dannis Dy, MD;  Location: [facility] OR;  Service: Vascular;  Laterality: Left;   IRRIGATION AND DEBRIDEMENT FOOT Right 05/25/2019   Procedure: INCISION AND DRAINAGE FOOT;  Surgeon: Camilo Cella, DPM;  Location: [facility];  Service: Podiatry;  Laterality: Right;   IRRIGATION AND DEBRIDEMENT FOOT Right 05/27/2019   Procedure: IRRIGATION AND DEBRIDEMENT FOOT;  Surgeon: Camilo Cella, DPM;  Location: [facility] OR;  Service: Podiatry;  Laterality: Right;   LASER PHOTO ABLATION Left 01/09/2023   Procedure: LASER PHOTO ABLATION;  Surgeon: Shon Downing, MD;  Location: [facility] OR;  Service: Ophthalmology;  Laterality: Left;   LEFT HEART CATH AND CORONARY ANGIOGRAPHY N/A 09/24/2020   Procedure: LEFT HEART CATH AND CORONARY ANGIOGRAPHY;  Surgeon: Swaziland, Peter M, MD;  Location: [facility] INVASIVE CV LAB;  Service: Cardiovascular;  Laterality: N/A;    LOWER EXTREMITY ANGIOGRAPHY Bilateral 05/17/2019   Procedure: LOWER EXTREMITY ANGIOGRAPHY;  Surgeon: Richrd Char, MD;  Location: [facility] INVASIVE CV LAB;  Service: Cardiovascular;  Laterality: Bilateral;   PARS PLANA VITRECTOMY Left 01/09/2023   Procedure: PARS PLANA VITRECTOMY WITH 25 GAUGE AND REPAIR OF RETINAL DETACHMENT;  Surgeon: Shon Downing, MD;  Location: [facility] OR;  Service: Ophthalmology;  Laterality: Left;   REMOVAL OF A DIALYSIS CATHETER Right 01/08/2021   Procedure: REMOVAL OF TEMPORARY DIALYSIS CATHETER, LEFT FEMORAL VEIN;  Surgeon: Dannis Dy, MD;  Location: [facility] OR;  Service: Vascular;  Laterality: Right;   REVISON OF ARTERIOVENOUS FISTULA Right 12/14/2012   Procedure: REVISON OF upper arm ARTERIOVENOUS FISTULA using 49mmx10cm gortex graft;  Surgeon: Arvil Lauber, MD;  Location: [facility] OR;  Service: Vascular;  Laterality: Right;   REVISON OF ARTERIOVENOUS FISTULA Right 12/27/2020   Procedure: RIGHT UPPER EXTREMITY ARTERIOVENOUS FISTULA REVISiON;  Surgeon: Carlene Che, MD;  Location: [facility] OR;  Service: Vascular;  Laterality: Right;  PERIPHERAL NERVE BLOCK   RIGHT/LEFT HEART CATH AND CORONARY ANGIOGRAPHY N/A 08/07/2020   Procedure: RIGHT/LEFT HEART CATH AND CORONARY ANGIOGRAPHY;  Surgeon: Swaziland, Peter M, MD;  Location: [facility] INVASIVE CV LAB;  Service: Cardiovascular;  Laterality: N/A;   SOFT TISSUE MASS EXCISION     Right arm, Left leg  for MRSA infection   VENOUS ANGIOPLASTY  12/29/2023   Procedure: VENOUS ANGIOPLASTY;  Surgeon: Baron Border, MD;  Location: [facility] INVASIVE CV LAB;  Service: Cardiovascular;;  Cephalic Arch; Intragraft; Distal Edge of Stent   Social History: He is married.  He has 1 son.  Retired/disabled.  Non-smoker.  No alcohol  use.  No drug use.  Family History: Brother with history of colon polyps.  No known family history of esophageal, gastric or colon cancer.  Mother with diabetes.  Allergies  Allergen Reactions   Crestor [Rosuvastatin]     Other  reaction(s): Muscle Pain   Lipitor [Atorvastatin ]     Other reaction(s): Muscle Pain   Amoxicillin Itching    Did it involve swelling of the face/tongue/throat, SOB, or low  BP? Unknown Did it involve sudden or severe rash/hives, skin peeling, or any reaction on the inside of your mouth or nose? Unknown Did you need to seek medical attention at a hospital or doctor's office? Unknown When did it last happen?      20 years ago If all above answers are "NO", may proceed with cephalosporin use.    Chlorhexidine  Hives    Patient has blistering   Hydromorphone  Itching    Patient states he may take with benadryl . Makes feet itch.   Percocet [Oxycodone -Acetaminophen ] Itching and Other (See Comments)    Legs only. Itching and burning feeling. Can tolerate with hydroxyzine     Morphine      Other reaction(s): rash/itching All narcotics per pt   Gabapentin Nausea And Vomiting    Stomach issues      Outpatient Encounter Medications as of 03/10/2024  Medication Sig   aspirin  EC 81 MG tablet Take 1 tablet (81 mg total) by mouth daily.   Continuous Blood Gluc Sensor (FREESTYLE LIBRE 14 DAY SENSOR) MISC See admin instructions.   EPINEPHrine  (EPIPEN  2-PAK) 0.3 mg/0.3 mL IJ SOAJ injection Inject 0.3 mg into the muscle as needed for anaphylaxis.   hydrOXYzine  (VISTARIL ) 50 MG capsule Take 50 mg by mouth every 6 (six) hours as needed for anxiety.   metoprolol  tartrate (LOPRESSOR ) 100 MG tablet Take 100 mg by mouth daily.   multivitamin (RENA-VIT) TABS tablet Take 1 tablet by mouth daily with lunch.   neomycin-polymyxin b -dexamethasone  (MAXITROL) 3.5-10000-0.1 OINT Place 1 Application into the left eye at bedtime.   nitroGLYCERIN  (NITROSTAT ) 0.4 MG SL tablet Place 0.4 mg under the tongue every 5 (five) minutes as needed for chest pain.   ofloxacin (OCUFLOX) 0.3 % ophthalmic solution Place 1 drop into the right eye 4 (four) times daily.   pantoprazole  (PROTONIX ) 40 MG tablet Take 40 mg by mouth daily as  needed (ACID REFLUX).   prednisoLONE acetate (PRED FORTE) 1 % ophthalmic suspension Place 1 drop into the right eye 4 (four) times daily.   pregabalin (LYRICA) 50 MG capsule Take 50 mg by mouth at bedtime as needed (PAIN).   sevelamer  carbonate (RENVELA ) 800 MG tablet Take 1,600 mg by mouth 3 (three) times daily with meals.   sodium zirconium cyclosilicate  (LOKELMA ) 10 g PACK packet Take 10 g by mouth See admin instructions. Non dialysis days(Tuesday,Thursday,Saturday and Sunday) as needed for high potassium   Tenapanor HCl, CKD, (XPHOZAH) 30 MG TABS Take 30 mg by mouth in the morning and at bedtime.   traZODone  (DESYREL ) 150 MG tablet Take 150 mg by mouth at bedtime as needed for sleep.   triamcinolone cream (KENALOG) 0.1 % Apply 1 Application topically 2 (two) times daily as needed (ITCHING).   [DISCONTINUED] loratadine  (CLARITIN ) 10 MG tablet Take 10 mg by mouth daily as needed for allergies.   No facility-administered encounter medications on file as of 03/10/2024.    REVIEW OF SYSTEMS:  Gen: + Fatigue. + Night sweats.  CV: Denies chest pain. + Leg right leg swelling.  Resp: + Cough and SOB.   GI: See HPI.  GU: Denies urinary burning, blood in urine, increased urinary frequency or incontinence. MS:+ Arthritis. Back pain. Derm: + Rash and itchiness.  Psych:+ Anxiety and depression.  Heme: Denies bruising, easy bleeding. Neuro:  Denies headaches, dizziness or paresthesias. Endo:+ DM type II. Excessive thirst.   PHYSICAL EXAM: BP 124/72   Pulse 64   Ht 5\' 10"  (1.778 m)   Wt 170 lb (77.1  kg)   BMI 24.39 kg/m   General: Chronically ill-appearing 58 year old male in no acute distress. Head: Normocephalic and atraumatic. Eyes: Pupils irregular R < L. Sclerae non-icteric, conjunctive pink. Ears: Normal auditory acuity. Mouth: Dentition intact. No ulcers or lesions.  Neck: Supple, no lymphadenopathy or thyromegaly.  Lungs: Clear bilaterally to auscultation without wheezes, crackles  or rhonchi. Heart: Regular rate and rhythm. Systolic murmur + AV graft with bruit radiates to chest. No rub or gallop appreciated.  Abdomen: Soft, nontender, nondistended. No masses. No hepatosplenomegaly. Normoactive bowel sounds x 4 quadrants. Moderate right inguinal hernia.  Rectal: Deferred.  Musculoskeletal: Symmetrical with no gross deformities. Skin: Warm and dry. No rash or lesions on visible extremities. Extremities: RUE AV graft. No edema. Left BKA. Neurological: Alert oriented x 4, no focal deficits.  Psychological: Alert and cooperative. Normal mood and affect.  ASSESSMENT AND PLAN:  58 year old male with an altered bowel pattern, intermittent constipation with significant gas build up sometimes results in vomiting and right inguinal hernia significantly bulges when constipated.  He also has loose stools if he greasy/fatty foods, onions or eats after 4 PM - Diagnostic colonoscopy deferred at this time as the patient is high risk for sedation/anesthesia and procedure complications secondary to multiple comorbidities and specifically at a higher risk for perforation secondary to a large nonreducible right inguinal hernia. - CTAP with oral contrast only to rule out colitis and colon mass - MiraLAX  nightly, may increase to twice daily - Psyllium husk daily as tolerated - Gas-X twice daily - Avoid food triggers  Chronic anemia secondary to ESRD -Patient declined lab draw today as he intends to see his PCP within the next few weeks, recommended CBC, CMP and iron  panel at that time  ESRD on HD  DM type II  Large right inguinal hernia  - Refer to general surgery to evaluate right inguinal hernia  CAD s/p DES x 2 in 09/2020 and DES x 1 03/2021, s/p NSTEMI 03/2021 and 08/2021 - Cardiac clearance would be required prior to pursuing any future endoscopic evaluation - Patient instructed to follow-up with cardiologist  PAD s/p left BKA 06/2022    CC:  Charle Congo, MD

## 2024-03-10 ENCOUNTER — Encounter: Payer: Self-pay | Admitting: Nurse Practitioner

## 2024-03-10 ENCOUNTER — Ambulatory Visit (INDEPENDENT_AMBULATORY_CARE_PROVIDER_SITE_OTHER): Admitting: Nurse Practitioner

## 2024-03-10 VITALS — BP 124/72 | HR 64 | Ht 70.0 in | Wt 170.0 lb

## 2024-03-10 DIAGNOSIS — K4090 Unilateral inguinal hernia, without obstruction or gangrene, not specified as recurrent: Secondary | ICD-10-CM | POA: Diagnosis not present

## 2024-03-10 DIAGNOSIS — K5900 Constipation, unspecified: Secondary | ICD-10-CM

## 2024-03-10 DIAGNOSIS — R197 Diarrhea, unspecified: Secondary | ICD-10-CM | POA: Diagnosis not present

## 2024-03-10 DIAGNOSIS — N186 End stage renal disease: Secondary | ICD-10-CM

## 2024-03-10 DIAGNOSIS — D631 Anemia in chronic kidney disease: Secondary | ICD-10-CM

## 2024-03-10 DIAGNOSIS — R194 Change in bowel habit: Secondary | ICD-10-CM

## 2024-03-10 DIAGNOSIS — Z992 Dependence on renal dialysis: Secondary | ICD-10-CM

## 2024-03-10 NOTE — Patient Instructions (Addendum)
 Follow up with cardiology Dr Dorothye Gathers.   Follow up with PCP for routine labs. [place], PA-C, recommends you get a CBC, CMET and iron  panel.   Continue psyllium husk every other day as tolerated to regulate bowels.   Start Miralax  1 capful daily in 8 ounces of liquid at bedtime.   _______________________________________________________  If your blood pressure at your visit was 140/90 or greater, please contact your primary care physician to follow up on this.  _______________________________________________________  If you are age 58 or older, your body mass index should be between 23-30. Your Body mass index is 24.39 kg/m. If this is out of the aforementioned range listed, please consider follow up with your Primary Care Provider.  If you are age 88 or younger, your body mass index should be between 19-25. Your Body mass index is 24.39 kg/m. If this is out of the aformentioned range listed, please consider follow up with your Primary Care Provider.   ________________________________________________________  The [facility] providers would like to encourage you to use MYCHART to communicate with providers for non-urgent requests or questions.  Due to long hold times on the telephone, sending your provider a message by [facility] may be a faster and more efficient way to get a response.  Please allow 48 business hours for a response.  Please remember that this is for non-urgent requests.  _______________________________________________________

## 2024-03-11 ENCOUNTER — Telehealth: Payer: Self-pay

## 2024-03-11 DIAGNOSIS — K4090 Unilateral inguinal hernia, without obstruction or gangrene, not specified as recurrent: Secondary | ICD-10-CM

## 2024-03-11 NOTE — Telephone Encounter (Signed)
 LMTCB in regards to scheduling the CT. CT order and [facility] referral are placed/faxed. CMA would like to know if there is a specific day of the week that would work best for the pt to get his CT before scheduling.

## 2024-03-15 ENCOUNTER — Telehealth: Payer: Self-pay

## 2024-03-15 NOTE — Telephone Encounter (Signed)
 Referral to [facility] was placed and faxed on Friday 03/11/24. Fax confirmation received. Spoke with pts wife Brian Campanile today 03/15/24 and informed her CT (with oral contrast ONLY) has been scheduled at [facility] on 04/07/24 at 1 pm, arriving at 10:45 am for registration and to start contrast. She is aware he is to have nothing by mouth after midnight. They have no questions at this time.

## 2024-03-15 NOTE — Telephone Encounter (Signed)
-----   Message from Tory Freiberg sent at 03/11/2024  8:59 AM EDT ----- Trevor Fudge, as we discussed, please enter a general surgery consult to evaluate moderate to large right inguinal hernia. Pls contact patient and schedule him for an abdominal/pelvic CT scan with oral contrast only to assess his colon and inguinal hernia. THX.

## 2024-03-15 NOTE — Progress Notes (Signed)
 Addendum: Reviewed and agree with assessment and management plan. Asha Grumbine, Carie Caddy, MD

## 2024-03-17 ENCOUNTER — Other Ambulatory Visit: Payer: Self-pay

## 2024-03-17 ENCOUNTER — Encounter (HOSPITAL_COMMUNITY)
Admission: RE | Disposition: A | Discharge status: Home / Self Care | Payer: Self-pay | Source: Home / Self Care | Attending: Vascular Surgery

## 2024-03-17 ENCOUNTER — Ambulatory Visit (HOSPITAL_COMMUNITY)
Admission: RE | Admit: 2024-03-17 | Discharge: 2024-03-17 | Disposition: A | Discharge status: Home / Self Care | Attending: Vascular Surgery | Admitting: Vascular Surgery

## 2024-03-17 ENCOUNTER — Telehealth: Payer: Self-pay

## 2024-03-17 DIAGNOSIS — Z992 Dependence on renal dialysis: Secondary | ICD-10-CM | POA: Insufficient documentation

## 2024-03-17 DIAGNOSIS — T82898A Other specified complication of vascular prosthetic devices, implants and grafts, initial encounter: Secondary | ICD-10-CM

## 2024-03-17 DIAGNOSIS — E1122 Type 2 diabetes mellitus with diabetic chronic kidney disease: Secondary | ICD-10-CM | POA: Diagnosis not present

## 2024-03-17 DIAGNOSIS — I120 Hypertensive chronic kidney disease with stage 5 chronic kidney disease or end stage renal disease: Secondary | ICD-10-CM | POA: Diagnosis present

## 2024-03-17 DIAGNOSIS — E1151 Type 2 diabetes mellitus with diabetic peripheral angiopathy without gangrene: Secondary | ICD-10-CM | POA: Diagnosis not present

## 2024-03-17 DIAGNOSIS — N186 End stage renal disease: Secondary | ICD-10-CM | POA: Diagnosis not present

## 2024-03-17 HISTORY — PX: VENOUS ANGIOPLASTY: CATH118376

## 2024-03-17 HISTORY — PX: A/V SHUNT INTERVENTION: CATH118220

## 2024-03-17 LAB — GLUCOSE, CAPILLARY: Glucose-Capillary: 280 mg/dL — ABNORMAL HIGH (ref 70–99)

## 2024-03-17 MED ORDER — LIDOCAINE HCL (PF) 1 % IJ SOLN
INTRAMUSCULAR | Status: AC
  Filled 2024-03-17: qty 30

## 2024-03-17 MED ORDER — IODIXANOL 320 MG/ML IV SOLN
INTRAVENOUS | Status: DC | PRN
  Administered 2024-03-17: 20 mL via INTRAVENOUS

## 2024-03-17 MED ORDER — LIDOCAINE HCL (PF) 1 % IJ SOLN
INTRAMUSCULAR | Status: DC | PRN
  Administered 2024-03-17: 2 mL via INTRADERMAL

## 2024-03-17 MED ORDER — HEPARIN (PORCINE) IN NACL 1000-0.9 UT/500ML-% IV SOLN
INTRAVENOUS | Status: DC | PRN
  Administered 2024-03-17: 500 mL

## 2024-03-17 NOTE — Telephone Encounter (Signed)
   Pre-operative Risk Assessment    Patient Name: [name]  DOB: 1966-02-12 MRN: [id number]   Date of last office visit: None  Date of next office visit: New Patient appointment needed    Request for Surgical Clearance    Procedure:  Bilateral inguinal hernial surgery  Date of Surgery:  Clearance TBD                                Surgeon:  Dr. Rayna Calkin Group or Practice Name:  [facility]  Phone number:  [phone] Fax number:  [phone] Attn: Eduardo Grade, CMA   Type of Clearance Requested:   - Medical  - Pharmacy:  Hold Aspirin  not indicated   Type of Anesthesia:  General    Additional requests/questions:    Kenny Peals   03/17/2024, 2:09 PM

## 2024-03-17 NOTE — Telephone Encounter (Signed)
   Name: [name]  DOB: Dec 02, 1965  MRN: [id number]  Primary Cardiologist: Dorothye Gathers, MD  Chart reviewed as part of pre-operative protocol coverage. Because of HANDSOME ANGLIN past medical history and time since last visit, he will require a follow-up in-office visit in order to better assess preoperative cardiovascular risk.  Patient has not been seen in office since 2021.  Pre-op covering staff: - Please schedule appointment and call patient to inform them. If patient already had an upcoming appointment within acceptable timeframe, please add "pre-op clearance" to the appointment notes so provider is aware. - Please contact requesting surgeon's office via preferred method (i.e, phone, fax) to inform them of need for appointment prior to surgery.   Jude Norton, NP  03/17/2024, 2:35 PM

## 2024-03-17 NOTE — Telephone Encounter (Signed)
 Pt has been scheduled new pt appt 03/30/24 with Dr. Filiberto Hug ok per Ruthann Cover, RN to use time slot 11:40 as new pt appt.

## 2024-03-17 NOTE — H&P (Signed)
 H&P    MRN #:  [id number]  History of Present Illness: This is a 58 y.o. male with end-stage renal disease that presents for malfunction of right arm AV fistula.  States this has had an issue for the last month or 2 since last intervention.  States dialysis has a hard time sticking this.  Past Medical History:  Diagnosis Date   Acute osteomyelitis of right foot (HCC) 05/15/2019   Anemia 06/23/2019   Aortic stenosis    Bursitis    right shoulder   Complication of anesthesia    Coronary artery disease    Diabetes with renal manifestations(250.4) 08/23/2013   Diabetic infection of right foot (HCC)    Diabetic retinopathy associated with type 2 diabetes mellitus (HCC) 06/23/2019   ESRD on hemodialysis (HCC) 07/18/2011   Essential hypertension 10/18/2007   Qualifier: Diagnosis of  By: Washington Hacker MD, Kaaren Ora A    History of blood transfusion    HYPERLIPIDEMIA 05/16/2008   Qualifier: Diagnosis of  By: Autry Legions MD, Alveda Aures    MRSA INFECTION 10/18/2007   Qualifier: Diagnosis of  By: Ana Balling, Lucy     Other forms of retinal detachment(361.89) 06/23/2019   PAD (peripheral artery disease) (HCC)    PONV (postoperative nausea and vomiting)    Prolonged QT interval    Pulmonary hypertension (HCC)    Secondary hyperparathyroidism (HCC) 06/23/2019   Sepsis (HCC) 05/14/2019    Past Surgical History:  Procedure Laterality Date   A/V FISTULAGRAM N/A 12/29/2023   Procedure: A/V Fistulagram;  Surgeon: Baron Border, MD;  Location: [facility] INVASIVE CV LAB;  Service: Cardiovascular;  Laterality: N/A;   ABDOMINAL AORTOGRAM N/A 05/17/2019   Procedure: ABDOMINAL AORTOGRAM;  Surgeon: Richrd Char, MD;  Location: [facility] INVASIVE CV LAB;  Service: Cardiovascular;  Laterality: N/A;   AMPUTATION TOE Right 05/14/2019   Procedure: PARTIAL AMPUTATION SECOND TOE RIGHT FOOT;  Surgeon: Camilo Cella, DPM;  Location: [facility] OR;  Service: Podiatry;  Laterality: Right;   AMPUTATION TOE Right 05/23/2019   Procedure:  AMPUTATION OF SECOND TOE METATARSAL PHALANGEAL JOINT;  Surgeon: Camilo Cella, DPM;  Location: [facility] OR;  Service: Podiatry;  Laterality: Right;   AMPUTATION TOE Right 05/27/2019   Procedure: Amputation Toe;  Surgeon: Camilo Cella, DPM;  Location: [facility] OR;  Service: Podiatry;  Laterality: Right;  right third toe   APPLICATION OF WOUND VAC Right 05/27/2019   Procedure: Application Of Wound Vac;  Surgeon: Camilo Cella, DPM;  Location: [facility] OR;  Service: Podiatry;  Laterality: Right;   AV FISTULA PLACEMENT  06-18-11   Right brachiocephalic AVF   BONE BIOPSY Right 05/14/2019   Procedure: OPEN SUPERFICIAL BONE BIOPSY GREAT TOE;  Surgeon: Camilo Cella, DPM;  Location: [facility] OR;  Service: Podiatry;  Laterality: Right;   CARDIAC CATHETERIZATION     CORONARY BALLOON ANGIOPLASTY N/A 09/24/2020   Procedure: CORONARY BALLOON ANGIOPLASTY;  Surgeon: Swaziland, Peter M, MD;  Location: [name] INVASIVE CV LAB;  Service: Cardiovascular;  Laterality: N/A;   CORONARY PRESSURE/FFR STUDY N/A 09/24/2020   Procedure: INTRAVASCULAR PRESSURE WIRE/FFR STUDY;  Surgeon: Swaziland, Peter M, MD;  Location: [facility] INVASIVE CV LAB;  Service: Cardiovascular;  Laterality: N/A;   CORONARY STENT INTERVENTION N/A 09/24/2020   Procedure: CORONARY STENT INTERVENTION;  Surgeon: Swaziland, Peter M, MD;  Location: [facility] INVASIVE CV LAB;  Service: Cardiovascular;  Laterality: N/A;   EYE SURGERY     left eye for Laser, diabetic retinopathy   FISTULOGRAM Right 11/06/2011   Procedure:  FISTULOGRAM;  Surgeon: Arvil Lauber, MD;  Location: [facility] CATH LAB;  Service: Cardiovascular;  Laterality: Right;   HEMATOMA EVACUATION Right Feb. 25, 2014   HEMATOMA EVACUATION Right 12/14/2012   Procedure: EVACUATION HEMATOMA;  Surgeon: Arvil Lauber, MD;  Location: [facility] OR;  Service: Vascular;  Laterality: Right;   INCISION AND DRAINAGE Right 05/23/2019   Procedure: INCISION AND DRAINAGE OF RIGHT FOOT DEEP SPACE ABSCESS;  Surgeon: Camilo Cella, DPM;  Location: [facility] OR;  Service:  Podiatry;  Laterality: Right;   INJECTION OF SILICONE OIL Left 01/09/2023   Procedure: INJECTION OF SILICONE OIL;  Surgeon: Shon Downing, MD;  Location: [facility] OR;  Service: Ophthalmology;  Laterality: Left;   INSERTION OF DIALYSIS CATHETER  12/14/2012   Procedure: INSERTION OF DIALYSIS CATHETER;  Surgeon: Arvil Lauber, MD;  Location: [facility] OR;  Service: Vascular;;   INSERTION OF DIALYSIS CATHETER Right 01/07/2021   Procedure: INSERTION OF DIALYSIS CATHETER;  Surgeon: Richrd Char, MD;  Location: [facility] OR;  Service: Vascular;  Laterality: Right;   INSERTION OF DIALYSIS CATHETER Left 01/08/2021   Procedure: INSERTION OF LEFT INTERNAL JUGULAR DIALYSIS CATHETER, tunneled;  Surgeon: Dannis Dy, MD;  Location: [facility] OR;  Service: Vascular;  Laterality: Left;   IRRIGATION AND DEBRIDEMENT FOOT Right 05/25/2019   Procedure: INCISION AND DRAINAGE FOOT;  Surgeon: Camilo Cella, DPM;  Location: [facility] OR;  Service: Podiatry;  Laterality: Right;   IRRIGATION AND DEBRIDEMENT FOOT Right 05/27/2019   Procedure: IRRIGATION AND DEBRIDEMENT FOOT;  Surgeon: Camilo Cella, DPM;  Location: [facility] OR;  Service: Podiatry;  Laterality: Right;   LASER PHOTO ABLATION Left 01/09/2023   Procedure: LASER PHOTO ABLATION;  Surgeon: Shon Downing, MD;  Location: [facility] OR;  Service: Ophthalmology;  Laterality: Left;   LEFT HEART CATH AND CORONARY ANGIOGRAPHY N/A 09/24/2020   Procedure: LEFT HEART CATH AND CORONARY ANGIOGRAPHY;  Surgeon: Swaziland, Peter M, MD;  Location: [facility] INVASIVE CV LAB;  Service: Cardiovascular;  Laterality: N/A;   LOWER EXTREMITY ANGIOGRAPHY Bilateral 05/17/2019   Procedure: LOWER EXTREMITY ANGIOGRAPHY;  Surgeon: Richrd Char, MD;  [facility]: [facility] INVASIVE CV LAB;  Service: Cardiovascular;  Laterality: Bilateral;   PARS PLANA VITRECTOMY Left 01/09/2023   Procedure: PARS PLANA VITRECTOMY WITH 25 GAUGE AND REPAIR OF RETINAL DETACHMENT;  Surgeon: Shon Downing, MD;  Location: [facility] OR;  Service: Ophthalmology;  Laterality:  Left;   REMOVAL OF A DIALYSIS CATHETER Right 01/08/2021   Procedure: REMOVAL OF TEMPORARY DIALYSIS CATHETER, LEFT FEMORAL VEIN;  Surgeon: Dannis Dy, MD;  Location: [facility] OR;  Service: Vascular;  Laterality: Right;   REVISON OF ARTERIOVENOUS FISTULA Right 12/14/2012   Procedure: REVISON OF upper arm ARTERIOVENOUS FISTULA using 25mmx10cm gortex graft;  Surgeon: Arvil Lauber, MD;  Location: [facility] OR;  Service: Vascular;  Laterality: Right;   REVISON OF ARTERIOVENOUS FISTULA Right 12/27/2020   Procedure: RIGHT UPPER EXTREMITY ARTERIOVENOUS FISTULA REVISiON;  Surgeon: Carlene Che, MD;  Location: [facility] OR;  Service: Vascular;  Laterality: Right;  PERIPHERAL NERVE BLOCK   RIGHT/LEFT HEART CATH AND CORONARY ANGIOGRAPHY N/A 08/07/2020   Procedure: RIGHT/LEFT HEART CATH AND CORONARY ANGIOGRAPHY;  Surgeon: Swaziland, Peter M, MD;  Location: [facility] INVASIVE CV LAB;  Service: Cardiovascular;  Laterality: N/A;   SOFT TISSUE MASS EXCISION     Right arm, Left leg  for MRSA infection   VENOUS ANGIOPLASTY  12/29/2023   Procedure: VENOUS ANGIOPLASTY;  Surgeon: Baron Border, MD;  Location: [facility] INVASIVE CV LAB;  Service: Cardiovascular;;  Cephalic Arch; Intragraft; Distal Edge of Stent    Allergies  Allergen Reactions   Crestor [Rosuvastatin]     Other reaction(s): Muscle Pain   Lipitor [Atorvastatin ]     Other reaction(s): Muscle Pain   Amoxicillin Itching    Did it involve swelling of the face/tongue/throat, SOB, or low BP? Unknown Did it involve sudden or severe rash/hives, skin peeling, or any reaction on the inside of your mouth or nose? Unknown Did you need to seek medical attention at a hospital or doctor's office? Unknown When did it last happen?      20 years ago If all above answers are "NO", may proceed with cephalosporin use.    Chlorhexidine  Hives    Patient has blistering   Hydromorphone  Itching    Patient states he may take with benadryl . Makes feet itch.   Percocet  [Oxycodone -Acetaminophen ] Itching and Other (See Comments)    Legs only. Itching and burning feeling. Can tolerate with hydroxyzine     Morphine      Other reaction(s): rash/itching All narcotics per pt   Gabapentin Nausea And Vomiting    Stomach issues     Prior to Admission medications   Medication Sig Start Date End Date Taking? Authorizing Provider  metoprolol  tartrate (LOPRESSOR ) 100 MG tablet Take 100 mg by mouth daily.   Yes [provider]  aspirin  EC 81 MG tablet Take 1 tablet (81 mg total) by mouth daily. Patient not taking: Reported on 03/10/2024 06/28/19   Wenona Hamilton, MD  Continuous Blood Gluc Sensor (FREESTYLE LIBRE 14 DAY SENSOR) MISC See admin instructions. 06/20/19   [provider]  EPINEPHrine  (EPIPEN  2-PAK) 0.3 mg/0.3 mL IJ SOAJ injection Inject 0.3 mg into the muscle as needed for anaphylaxis.    [provider]  hydrOXYzine  (VISTARIL ) 50 MG capsule Take 50 mg by mouth every 6 (six) hours as needed for anxiety.    [provider]  multivitamin (RENA-VIT) TABS tablet Take 1 tablet by mouth daily with lunch.    [provider]  neomycin-polymyxin b -dexamethasone  (MAXITROL) 3.5-10000-0.1 OINT Place 1 Application into the left eye at bedtime. Patient not taking: Reported on 03/10/2024 11/13/23   [provider]  nitroGLYCERIN  (NITROSTAT ) 0.4 MG SL tablet Place 0.4 mg under the tongue every 5 (five) minutes as needed for chest pain.    [provider]  ofloxacin (OCUFLOX) 0.3 % ophthalmic solution Place 1 drop into the right eye 4 (four) times daily. Patient not taking: Reported on 03/10/2024 12/08/23   [provider]  pantoprazole  (PROTONIX ) 40 MG tablet Take 40 mg by mouth daily as needed (ACID REFLUX).    [provider]  prednisoLONE acetate (PRED FORTE) 1 % ophthalmic suspension Place 1 drop into the right eye 4 (four) times daily. 12/08/23   [provider]  pregabalin (LYRICA) 50 MG  capsule Take 50 mg by mouth at bedtime as needed (PAIN). 12/17/23   [provider]  sevelamer  carbonate (RENVELA ) 800 MG tablet Take 1,600 mg by mouth 3 (three) times daily with meals. 07/25/20   [provider]  sodium zirconium cyclosilicate  (LOKELMA ) 10 g PACK packet Take 10 g by mouth See admin instructions. Non dialysis days(Tuesday,Thursday,Saturday and Sunday) as needed for high potassium    [provider]  Tenapanor HCl, CKD, (XPHOZAH) 30 MG TABS Take 30 mg by mouth in the morning and at bedtime. 04/08/23   [provider]  traZODone  (DESYREL ) 150 MG tablet Take 150 mg by  mouth at bedtime as needed for sleep. 07/15/21   [provider]  triamcinolone cream (KENALOG) 0.1 % Apply 1 Application topically 2 (two) times daily as needed (ITCHING).    [provider]  loratadine  (CLARITIN ) 10 MG tablet Take 10 mg by mouth daily as needed for allergies.  11/06/19  [provider]    Social History   Socioeconomic History   Marital status: Married    Spouse name: Not on file   Number of children: 2   Years of education: 14   Highest education level: Not on file  Occupational History   Occupation: Disabled  Tobacco Use   Smoking status: Never    Passive exposure: Past   Smokeless tobacco: Never  Vaping Use   Vaping status: Never Used  Substance and Sexual Activity   Alcohol  use: No   Drug use: No   Sexual activity: Yes  Other Topics Concern   Not on file  Social History Narrative   Not on file   Social Drivers of Health   Financial Resource Strain: Not on file  Food Insecurity: No Food Insecurity (08/21/2022)   Received from [facility], [facility]   Hunger Vital Sign    Worried About Running Out of Food in the Last Year: Never true    Ran Out of Food in the Last Year: Never true  Transportation Needs: No Transportation Needs (07/19/2022)   Received from [facility], [facility]   [facility] - Transportation    In the past 12 months, has lack of transportation kept you from medical appointments or from getting medications?: No    Lack of Transportation (Non-Medical): No  Physical Activity: Inactive (01/19/2024)   Exercise Vital Sign    Days of Exercise per Week: 0 days    Minutes of Exercise per Session: 0 min  Stress: Stress Concern Present (01/19/2024)   [employer] [facility] [employer] - Occupational Stress Questionnaire    Feeling of Stress : Rather much  Social Connections: Not on file  Intimate Partner Violence: Not on file     Family History  Problem Relation Age of Onset   Diabetes Mother     ROS: [x]  Positive   [ ]  Negative   [ ]  All sytems reviewed and are negative  Cardiovascular: []  chest pain/pressure []  palpitations []  SOB lying flat []  DOE []  pain in legs while walking []  pain in legs at rest []  pain in legs at night []  non-healing ulcers []  hx of DVT []  swelling in legs  Pulmonary: []  productive cough []  asthma/wheezing []  home O2  Neurologic: []  weakness in []  arms []  legs []  numbness in []  arms []  legs []  hx of CVA []  mini stroke [] difficulty speaking or slurred speech []  temporary loss of vision in one eye []  dizziness  Hematologic: []  hx of cancer []  bleeding problems []  problems with blood clotting easily  Endocrine:   []  diabetes []  thyroid  disease  GI []  vomiting blood []  blood in stool  GU: []  CKD/renal failure []  HD--[]  M/W/F or []  T/T/S []  burning with urination []  blood in urine  Psychiatric: []  anxiety []  depression  Musculoskeletal: []  arthritis []  joint pain  Integumentary: []  rashes []  ulcers  Constitutional: []  fever []  chills   Physical Examination  Vitals:   03/17/24 0722  BP: (!) 141/82  Pulse: 66  Resp: 16  Temp: 98.1 F (36.7 C)  SpO2: 97%   There  is no height or weight on file to calculate BMI.  General:  WDWN in NAD Gait:  Not observed HENT: WNL, normocephalic Pulmonary: normal non-labored breathing Cardiac: regular, without  Murmurs, rubs or gallops Abdomen:  soft, NT/ND Vascular Exam/Pulses: Right arm AV fistula with weak thrill   CBC    Component Value Date/Time   WBC 5.1 06/02/2023 1700   RBC 3.85 (L) 06/02/2023 1700   HGB 10.6 (L) 06/02/2023 1700   HGB 8.6 (L) 06/28/2019 1042   HCT 32.3 (L) 06/02/2023 1700   HCT 27.8 (L) 06/28/2019 1042   PLT 206 06/02/2023 1700   PLT 308 06/28/2019 1042   MCV 83.9 06/02/2023 1700   MCV 85 06/28/2019 1042   MCH 27.5 06/02/2023 1700   MCHC 32.8 06/02/2023 1700   RDW 15.4 (H) 06/02/2023 1700   RDW 17.3 (H) 06/28/2019 1042   LYMPHSABS 0.5 (L) 02/11/2021 0812   MONOABS 0.5 02/11/2021 0812   EOSABS 0.2 02/11/2021 0812   BASOSABS 0.1 02/11/2021 0812    BMET    Component Value Date/Time   NA 136 06/02/2023 1700   NA 141 06/28/2019 1042   K 4.6 06/02/2023 1700   CL 93 (L) 06/02/2023 1700   CO2 20 06/02/2023 1700   GLUCOSE 95 06/02/2023 1700   BUN 54 (H) 06/02/2023 1700   BUN 28 (H) 06/28/2019 1042   CREATININE 10.48 (H) 06/02/2023 1700   CALCIUM  9.6 06/02/2023 1700   CALCIUM  7.9 (L) 08/31/2009 1524   GFRNONAA 7 (L) 09/15/2021 0310   GFRAA 6 (L) 08/05/2019 0537    COAGS: Lab Results  Component Value Date   INR 1.1 09/13/2021   INR 1.14 06/18/2011   INR 1.40 06/10/2011     Non-Invasive Vascular Imaging:      ASSESSMENT/PLAN: This is a 58 y.o. male  with end-stage renal disease that presents for malfunction of right arm AV fistula.  States this has had an issue for the last month or 2 since last intervention.  Plan right arm fistulogram with possible intervention.  Ekblad Hensen, MD [facility] [place] Office: [phone]  Sze Hensen

## 2024-03-17 NOTE — Op Note (Signed)
    Patient name: [name] MRN: [id number] DOB: 01-13-1966 Sex: male  03/17/2024 Pre-operative Diagnosis: Malfunction right arm AV fistula Post-operative diagnosis:  Same Surgeon:  Fawcett Hensen, MD Procedure Performed: 1.  Ultrasound-guided access right brachiocephalic AV fistula 2.  Right upper extremity fistulogram including central venogram 3.  Angioplasty AVF peripheral at the cephalic arch (6 mm x 80 mm Mustang and 7 mm x 80 mm Mustang)  Indications: 58 year old male with end-stage renal disease using a right arm brachiocephalic fistula that has been revised and has a stent in the cephalic arch.  He presents for fistulogram after malfunction of his right arm AV fistula.  Findings: No evidence of central venous stenosis.  There was high-grade over 80% stenosis in the proximal and distal segment of stent in the cephalic arch.  This was treated with a 6 mm and 7 mm Mustang.  Excellent flow.  The fistula is very calcified circumferentially and had to stick this a second time higher up in order to get my 6 French sheath in place.   Procedure:  The patient was identified in the holding area and taken to [facility] lab.  The patient was then placed supine on the table and prepped and draped in the usual sterile fashion.  A time out was called.  Ultrasound was used to evaluate the right arm AV fistula, this was patent, an image was saved.  The fistula was circumferentially calcified down by the antecubitum.  I initially used lidocaine  and then placed a micro access needle microwire and micro sheath.  We then got right upper extremity fistulogram images including central venogram from the micro sheath.  I then went to intervene on the in-stent stenosis in the arch.  I could not get my Bentson wire to track.  Ultimately elected to remove the micro access needle with a figure-of-eight and reaccessed the fistula higher up the arm where there was less calcification.  I then ultimately used a Glidewire  to get through the in-stent stenosis and exchanged for a 6 French sheath.  The lesion was then treated with a 6 mm x 80 mm Mustang to nominal pressure for 2 minutes and then I upsized to a 7 mm x 80 mm mustang to nominal pressure for 2 minutes.  Widely patent fistula at completion with better thrill.  Pursestring was tied down and the sheath was removed.  Taken holding in stable condition.     Givler Hensen, MD [facility] [place] Office: [phone]

## 2024-03-18 ENCOUNTER — Encounter (HOSPITAL_COMMUNITY): Payer: Self-pay | Admitting: Vascular Surgery

## 2024-03-30 ENCOUNTER — Encounter: Payer: Self-pay | Admitting: Cardiology

## 2024-03-30 ENCOUNTER — Ambulatory Visit: Attending: Cardiovascular Disease | Admitting: Cardiology

## 2024-03-30 VITALS — BP 130/70 | HR 64 | Resp 16 | Ht 70.0 in | Wt 160.0 lb

## 2024-03-30 DIAGNOSIS — Z01810 Encounter for preprocedural cardiovascular examination: Secondary | ICD-10-CM | POA: Diagnosis present

## 2024-03-30 DIAGNOSIS — R011 Cardiac murmur, unspecified: Secondary | ICD-10-CM | POA: Diagnosis present

## 2024-03-30 DIAGNOSIS — I2510 Atherosclerotic heart disease of native coronary artery without angina pectoris: Secondary | ICD-10-CM | POA: Diagnosis present

## 2024-03-30 DIAGNOSIS — I739 Peripheral vascular disease, unspecified: Secondary | ICD-10-CM | POA: Insufficient documentation

## 2024-03-30 DIAGNOSIS — E782 Mixed hyperlipidemia: Secondary | ICD-10-CM | POA: Diagnosis present

## 2024-03-30 MED ORDER — METOPROLOL SUCCINATE ER 100 MG PO TB24: 100.0000 mg | ORAL_TABLET | Freq: Every day | ORAL | 3 refills | Status: DC

## 2024-03-30 NOTE — Patient Instructions (Signed)
 Medication Instructions:  STOP Metoprolol  Tartrate  START Metoprolol  Succinate XL 100 mg daily   *If you need a refill on your cardiac medications before your next appointment, please call your pharmacy*  Lab Work: LIPID PANEL   If you have labs (blood work) drawn today and your tests are completely normal, you will receive your results only by: MyChart Message (if you have MyChart) OR A paper copy in the mail If you have any lab test that is abnormal or we need to change your treatment, we will call you to review the results.  Testing/Procedures: Echo  Your physician has requested that you have an echocardiogram. Echocardiography is a painless test that uses sound waves to create images of your heart. It provides your doctor with information about the size and shape of your heart and how well your heart's chambers and valves are working. This procedure takes approximately one hour. There are no restrictions for this procedure. Please do NOT wear cologne, perfume, aftershave, or lotions (deodorant is allowed). Please arrive 15 minutes prior to your appointment time.  Please note: We ask at that you not bring children with you during ultrasound (echo/ vascular) testing. Due to room size and safety concerns, children are not allowed in the ultrasound rooms during exams. Our front office staff cannot provide observation of children in our lobby area while testing is being conducted. An adult accompanying a patient to their appointment will only be allowed in the ultrasound room at the discretion of the ultrasound technician under special circumstances. We apologize for any inconvenience.  LEXISCAN   Your physician has requested that you have a lexiscan  myoview. For further information please visit [email or web address] Please follow instruction sheet, as given.   Follow-Up: At [facility], you and your health needs are our priority.  As part of our continuing mission to provide you  with exceptional heart care, our providers are all part of one team.  This team includes your primary Cardiologist (physician) and Advanced Practice Providers or APPs (Physician Assistants and Nurse Practitioners) who all work together to provide you with the care you need, when you need it.  Your next appointment:   1 year(s)  Provider:   Cody Das, MD

## 2024-03-30 NOTE — Progress Notes (Signed)
 Cardiology Office Note:  .   Date:  03/30/2024  ID:  [name], DOB 1966-05-09, MRN [id number] PCP: Charle Congo, MD  [facility] Providers Cardiologist:  Fransico Ivy, MD PCP: Charle Congo, MD  Chief Complaint  Patient presents with   Pre-op Exam   New Patient (Initial Visit)   Coronary Artery Disease     [name] is a 58 y.o. male with hypertension, hyperlipidemia, diabetes, CAD, PAD  s/p left BKA & right transmetatarsal potation, ESRD on HD  Discussed the use of AI scribe software for clinical note transcription with the patient, who gave verbal consent to proceed.  History of Present Illness  Patient has known severe multivessel CAD with prior PCI and ISR in LAD.  He was hospitalized at [facility] in 2022.  There were no good targets for revascularization as per discussion then.  He was then seen at [facility], last in 04/2022.  It was unclear if bypass surgery option was discussed with Dr. Heywood Louder at [facility],.  Patient is here today with his wife. He is going to undergo bilateral inguinal hernia repair surgery in the near future with [facility] here in [place].  Patient is here today in a wheelchair, but ambulates with a prosthetic leg at home.  Physical activity is limited to walking mailbox and back.  With this level of activity, he denies any chest pain or shortness of breath symptoms.  He has had chronic mild right leg swelling.  He denies any open wounds or injuries.    Vitals:   03/30/24 1143  BP: 130/70  Pulse: 64  Resp: 16  SpO2: 96%      Review of Systems  Cardiovascular:  Negative for chest pain, dyspnea on exertion, leg swelling, palpitations and syncope.        Studies Reviewed: Aaron Aas        EKG 03/30/2024: Normal sinus rhythm Left ventricular hypertrophy with repolarization abnormality ( Cornell product ) When compared with ECG of 09-Jan-2023 13:06, Questionable change in initial forces of Lateral leads     Independently interpreted 04/2023: Chol 156, TG 114, HDL 78, LDL 58 HbA1C 6.6% Hb 10.6 Cr 10.4 TSH 3.5  Coronary angiogram 08/2021: 1. .  The proximal LAD has diffuse mild disease.  Within the mid LAD there is a long segment of stent with modest 30 to 40% in-stent restenosis.  The apical LAD is subtotally occluded.  It is a very small vessel in the segment.  There is a very small first   diagonal branch which is also subtotally occluded.  A medium sized second diagonal branch has a stent in its ostial segment with approximately 80% in-stent restenosis.  There is an inferior branch of this diagonal branch which is occluded and fills via  left to left collaterals.  There is a small ramus branch which has diffuse 90% disease as well as a small OM1 branch of the left circumflex which has diffuse 90% disease.  Within the mid circumflex there is mild to moderate disease and severe disease in  the distal circumflex which is also a small vessel.  The RCA has nonobstructive disease with a widely patent stent in the PL branch.  2.  Severely elevated LVEDP  3.  Severe systemic hypertension, patient was given multiple doses of medications including labetalol  and hydralazine  to address the blood pressure  4.  Despite premedication for possible allergy, the patient developed severe pruritus of his lower legs, treated with additional Benadryl   and steroids.  No other signs of any systemic allergic reaction were present.  5.  Moderate sedation with IV Benadryl , fentanyl , and Versed  for greater than 20 minutes was personally supervised by me with no sedation issues     Recommendations:   1. Left femoral arterial sheath will be removed when blood pressure has improved  2.  No good targets for PCI identified.  Would recommend continued aggressive risk factor modification medical therapy     Physical Exam Vitals and nursing note reviewed.  Constitutional:      General: He is not in acute distress. Neck:      Vascular: No JVD.  Cardiovascular:     Rate and Rhythm: Normal rate and regular rhythm.     Heart sounds: Normal heart sounds. No murmur heard. Pulmonary:     Effort: Pulmonary effort is normal.     Breath sounds: Normal breath sounds. No wheezing or rales.  Musculoskeletal:     Right lower leg: Edema (Trace) present.     Comments: Left BKA, right transmetatarsal amputation      VISIT DIAGNOSES:   ICD-10-CM   1. Coronary artery disease involving native coronary artery of native heart without angina pectoris  I25.10 Myocardial Perfusion Imaging    Cardiac Stress Test: Informed Consent Details: Physician/Practitioner Attestation; Transcribe to consent form and obtain patient signature    2. Preop cardiovascular exam  Z01.810 EKG 12-Lead    3. Mixed hyperlipidemia  E78.2 Lipid panel    4. Murmur  R01.1 ECHOCARDIOGRAM COMPLETE    5. PAD (peripheral artery disease) (HCC)  I73.9        [name] is a 58 y.o. male with hypertension, hyperlipidemia, diabetes, CAD, PAD  s/p left BKA & right transmetatarsal potation, ESRD on HD Assessment & Plan  CAD:  Known extensive CAD with no options for revascularization as of 2022.  Assessment of physical functional capacity limited due to patient's baseline functional status (ambulatory short distances using prosthetic leg).  Recommend echocardiogram and Lexiscan  nuclear stress test for cardiac risk stratification.  I anticipate to see ischemia on stress testing.  If mild, may be okay to proceed with elevated but acceptable perioperative cardiac risk.  If moderate to severe ischemia or TID noted, he may need repeat cardiac catheterization to ensure no severe left main disease.  As noted above, it is unlikely that he has any options for revascularization.  Continue aspirin  81 mg daily. Patient is not on any lipid-lowering therapy at this time.  He was on Repatha in 2024, discontinued since then.  Check lipid panel today. He will likely need  reinitiation of Repatha. Change metoprolol  to tartrate 100 mg daily to metoprolol  succinate 100 mg daily. DM management as per PCP, currently not on on any therapy, but controlled diabetes.  PAD: Continue Aspirin , losartan. See above regarding lipid lowering therapy.   Murmur: Likely related to dialysis access. Will obtain echocardiogram.  Informed Consent   Shared Decision Making/Informed Consent The risks [chest pain, shortness of breath, cardiac arrhythmias, dizziness, blood pressure fluctuations, myocardial infarction, stroke/transient ischemic attack, nausea, vomiting, allergic reaction, radiation exposure, metallic taste sensation and life-threatening complications (estimated to be 1 in 10,000)], benefits (risk stratification, diagnosing coronary artery disease, treatment guidance) and alternatives of a nuclear stress test were discussed in detail with Mr. Calzadilla and he agrees to proceed.       No orders of the defined types were placed in this encounter.    F/u in 1 year  Signed,  Cody Das, MD

## 2024-03-31 ENCOUNTER — Ambulatory Visit: Payer: Self-pay

## 2024-03-31 ENCOUNTER — Other Ambulatory Visit: Payer: Self-pay | Admitting: *Deleted

## 2024-03-31 DIAGNOSIS — E782 Mixed hyperlipidemia: Secondary | ICD-10-CM

## 2024-03-31 DIAGNOSIS — T82590A Other mechanical complication of surgically created arteriovenous fistula, initial encounter: Secondary | ICD-10-CM

## 2024-03-31 LAB — LIPID PANEL
Chol/HDL Ratio: 2.7 ratio (ref 0.0–5.0)
Cholesterol, Total: 239 mg/dL — ABNORMAL HIGH (ref 100–199)
HDL: 89 mg/dL (ref >39)
LDL Chol Calc (NIH): 93 mg/dL (ref 0–99)
Triglycerides: 350 mg/dL — ABNORMAL HIGH (ref 0–149)
VLDL Cholesterol Cal: 57 mg/dL — ABNORMAL HIGH (ref 5–40)

## 2024-04-01 NOTE — Progress Notes (Signed)
 Triglycerides elevated like it was 2 years ago, but was normal 11 months ago.  Patient was likely not fasting at the time of his labs being checked.  If patient is amenable, recommend starting statin Crestor 20 mg daily, and repeat fasting lipid panel in 3 months.  At that time, if triglycerides and LDL still elevated, could consider alternate options.  Goal triglyceride <150 Goal LDL <55  Thanks MJP

## 2024-04-04 ENCOUNTER — Other Ambulatory Visit: Payer: Self-pay

## 2024-04-05 ENCOUNTER — Other Ambulatory Visit (HOSPITAL_COMMUNITY)

## 2024-04-06 NOTE — Progress Notes (Signed)
 Lets hold off and repeat fasting lipid panel in 2 weeks after 12-14 hour fasting please.  Thanks MJP

## 2024-04-07 ENCOUNTER — Encounter (HOSPITAL_COMMUNITY): Payer: Self-pay

## 2024-04-07 ENCOUNTER — Ambulatory Visit (HOSPITAL_COMMUNITY)
Admission: RE | Admit: 2024-04-07 | Discharge: 2024-04-07 | Disposition: A | Discharge status: Home / Self Care | Source: Ambulatory Visit | Attending: Nurse Practitioner | Admitting: Nurse Practitioner

## 2024-04-07 DIAGNOSIS — K4090 Unilateral inguinal hernia, without obstruction or gangrene, not specified as recurrent: Secondary | ICD-10-CM | POA: Diagnosis present

## 2024-04-07 MED ORDER — IOHEXOL 9 MG/ML PO SOLN
ORAL | Status: AC
  Filled 2024-04-07: qty 1000

## 2024-04-07 MED ORDER — IOHEXOL 9 MG/ML PO SOLN
1000.0000 mL | Freq: Once | ORAL | Status: AC
  Administered 2024-04-07: 1000 mL via ORAL

## 2024-04-10 ENCOUNTER — Ambulatory Visit: Payer: Self-pay | Admitting: Nurse Practitioner

## 2024-04-11 ENCOUNTER — Ambulatory Visit (HOSPITAL_COMMUNITY)
Admission: RE | Admit: 2024-04-11 | Discharge: 2024-04-11 | Disposition: A | Discharge status: Home / Self Care | Source: Ambulatory Visit | Attending: Surgery | Admitting: Surgery

## 2024-04-11 ENCOUNTER — Ambulatory Visit (HOSPITAL_BASED_OUTPATIENT_CLINIC_OR_DEPARTMENT_OTHER)
Admission: RE | Admit: 2024-04-11 | Discharge: 2024-04-11 | Disposition: A | Discharge status: Home / Self Care | Source: Ambulatory Visit | Attending: Surgery | Admitting: Surgery

## 2024-04-11 DIAGNOSIS — T82590A Other mechanical complication of surgically created arteriovenous fistula, initial encounter: Secondary | ICD-10-CM

## 2024-04-14 ENCOUNTER — Other Ambulatory Visit: Payer: Self-pay | Admitting: Cardiology

## 2024-04-14 DIAGNOSIS — I2510 Atherosclerotic heart disease of native coronary artery without angina pectoris: Secondary | ICD-10-CM

## 2024-04-22 LAB — LIPID PANEL
Chol/HDL Ratio: 2.5 ratio (ref 0.0–5.0)
Cholesterol, Total: 205 mg/dL — ABNORMAL HIGH (ref 100–199)
HDL: 83 mg/dL (ref >39)
LDL Chol Calc (NIH): 111 mg/dL — ABNORMAL HIGH (ref 0–99)
Triglycerides: 60 mg/dL (ref 0–149)
VLDL Cholesterol Cal: 11 mg/dL (ref 5–40)

## 2024-04-22 NOTE — Progress Notes (Signed)
 LDL not at goal. Extensive CAD, PAD. Refer to lipid clinic for reconsideration of Repatha.  Thanks MJP

## 2024-04-27 NOTE — Telephone Encounter (Signed)
 Patient returning call.

## 2024-05-09 NOTE — Progress Notes (Signed)
 Patient name: [name] MRN: [id number] DOB: Feb 17, 1966 Sex: male  REASON FOR VISIT: Persistent pseudoaneurysm, low access flow, new access?  HPI: [name] is a 58 y.o. male with history of ESRD and diabetes that presents for evaluation of his right arm AV fistula for possible new access.  Patient has a right brachiocephalic placed in 2012 by Dr. Laurence.  This has undergone revision with previous interposition.  He had multiple fistulogram's as recently as 03/17/2024 with a stenosis adjacent to the stent in the cephalic arch.  He states after recent fistulogram the fistula worked about 2 weeks and then had problems again.  He is right-handed.  Only ever had a right arm fistula.  Past Medical History:  Diagnosis Date   Acute osteomyelitis of right foot (HCC) 05/15/2019   Anemia 06/23/2019   Aortic stenosis    Bursitis    right shoulder   Complication of anesthesia    Coronary artery disease    Diabetes with renal manifestations(250.4) 08/23/2013   Diabetic infection of right foot (HCC)    Diabetic retinopathy associated with type 2 diabetes mellitus (HCC) 06/23/2019   ESRD on hemodialysis (HCC) 07/18/2011   Essential hypertension 10/18/2007   Qualifier: Diagnosis of  By: Kassie MD, Alyce A    History of blood transfusion    HYPERLIPIDEMIA 05/16/2008   Qualifier: Diagnosis of  By: Norleen MD, [name] ORN    MRSA INFECTION 10/18/2007   Qualifier: Diagnosis of  By: Wilhemina KRAFT, Lucy     Other forms of retinal detachment(361.89) 06/23/2019   PAD (peripheral artery disease) (HCC)    PONV (postoperative nausea and vomiting)    Prolonged QT interval    Pulmonary hypertension (HCC)    Secondary hyperparathyroidism (HCC) 06/23/2019   Sepsis (HCC) 05/14/2019    Past Surgical History:  Procedure Laterality Date   A/V FISTULAGRAM N/A 12/29/2023   Procedure: A/V Fistulagram;  Surgeon: Norine Manuelita LABOR, MD;  Location: [facility] IN[facility]AS[facility];  Service: Cardiovascular;  Laterality: N/A;   A/V  SHUNT INTERVENTION N/A 03/17/2024   Procedure: A/V SHUNT INTERVENTION;  Surgeon: Gretta Lonni PARAS, MD;  Location: [facility];  Service: Cardiovascular;  Laterality: N/A;  cephalic arch   ABDOMINAL AORTOGRAM N/A 05/17/2019   Procedure: ABDOMINAL AORTOGRAM;  Surgeon: Harvey Carlin BRAVO, MD;  Location: [facility] INVASIVE CV LAB;  Service: Cardiovascular;  Laterality: N/A;   AMPUTATION TOE Right 05/14/2019   Procedure: PARTIAL AMPUTATION SECOND TOE RIGHT FOOT;  Surgeon: Gretel Ozell PARAS, DPM;  Location: [facility] OR;  Service: Podiatry;  Laterality: Right;   AMPUTATION TOE Right 05/23/2019   Procedure: AMPUTATION OF SECOND TOE METATARSAL PHALANGEAL JOINT;  Surgeon: Gretel Ozell PARAS, DPM;  Location: [facility] OR;  Service: Podiatry;  Laterality: Right;   AMPUTATION TOE Right 05/27/2019   Procedure: Amputation Toe;  Surgeon: Gretel Ozell PARAS, DPM;  Location: [facility];  Service: Podiatry;  Laterality: Right;  right third toe   APPLICATION OF WOUND VAC Right 05/27/2019   Procedure: Application Of Wound Vac;  Surgeon: Gretel Ozell PARAS, DPM;  Location: [facility] OR;  Service: Podiatry;  Laterality: Right;   AV FISTULA PLACEMENT  06-18-11   Right brachiocephalic AVF   BONE BIOPSY Right 05/14/2019   Procedure: OPEN SUPERFICIAL BONE BIOPSY GREAT TOE;  Surgeon: Gretel Ozell PARAS, DPM;  Location: [facility];  Service: Podiatry;  Laterality: Right;   CARDIAC CATHETERIZATION     CORONARY BALLOON ANGIOPLASTY N/A 09/24/2020   Procedure: CORONARY BALLOON ANGIOPLASTY;  Surgeon: Swaziland, Peter M,  MD;  [facility]: [facility];  Service: Cardiovascular;  Laterality: N/A;   CORONARY PRESSURE/FFR STUDY N/A 09/24/2020   Procedure: INTRAVASCULAR PRESSURE WIRE/FFR STUDY;  Surgeon: Swaziland, Peter M, MD;  Location: [facility] INVASIVE CV LAB;  Service: Cardiovascular;  Laterality: N/A;   CORONARY STENT INTERVENTION N/A 09/24/2020   Procedure: CORONARY STENT INTERVENTION;  Surgeon: Swaziland, Peter M, MD;  Location: [facility] INVASIVE CV LAB;  Service: Cardiovascular;  Laterality: N/A;    EYE SURGERY     left eye for Laser, diabetic retinopathy   FISTULOGRAM Right 11/06/2011   Procedure: FISTULOGRAM;  Surgeon: Redell LITTIE Door, MD;  Location: [facility] CATH LAB;  Service: Cardiovascular;  Laterality: Right;   HEMATOMA EVACUATION Right Feb. 25, 2014   HEMATOMA EVACUATION Right 12/14/2012   Procedure: EVACUATION HEMATOMA;  Surgeon: Redell LITTIE Door, MD;  Location: [facility] OR;  Service: Vascular;  Laterality: Right;   INCISION AND DRAINAGE Right 05/23/2019   Procedure: INCISION AND DRAINAGE OF RIGHT FOOT DEEP SPACE ABSCESS;  Surgeon: Gretel Ozell PARAS, DPM;  [facility];  Service: Podiatry;  Laterality: Right;   INJECTION OF SILICONE OIL Left 01/09/2023   Procedure: INJECTION OF SILICONE OIL;  Surgeon: Elner Arley LABOR, MD;  Location: [facility] OR;  Service: Ophthalmology;  Laterality: Left;   INSERTION OF DIALYSIS CATHETER  12/14/2012   Procedure: INSERTION OF DIALYSIS CATHETER;  Surgeon: Redell LITTIE Door, MD;  Location: [facility] OR;  Service: Vascular;;   INSERTION OF DIALYSIS CATHETER Right 01/07/2021   Procedure: INSERTION OF DIALYSIS CATHETER;  Surgeon: Harvey Carlin BRAVO, MD;  Location: [facility] OR;  Service: Vascular;  Laterality: Right;   INSERTION OF DIALYSIS CATHETER Left 01/08/2021   Procedure: INSERTION OF LEFT INTERNAL JUGULAR DIALYSIS CATHETER, tunneled;  Surgeon: Eliza Lonni RAMAN, MD;  Location: [facility] OR;  Service: Vascular;  Laterality: Left;   IRRIGATION AND DEBRIDEMENT FOOT Right 05/25/2019   Procedure: INCISION AND DRAINAGE FOOT;  Surgeon: Gretel Ozell PARAS, DPM;  [facility]: [facility];  Service: Podiatry;  Laterality: Right;   IRRIGATION AND DEBRIDEMENT FOOT Right 05/27/2019   Procedure: IRRIGATION AND DEBRIDEMENT FOOT;  Surgeon: Gretel Ozell PARAS, DPM;  Location: [facility];  Service: Podiatry;  Laterality: Right;   LASER PHOTO ABLATION Left 01/09/2023   Procedure: LASER PHOTO ABLATION;  Surgeon: Elner Arley LABOR, MD;  Location: [facility] OR;  Service: Ophthalmology;  Laterality: Left;   LEFT HEART CATH AND CORONARY ANGIOGRAPHY  N/A 09/24/2020   Procedure: LEFT HEART CATH AND CORONARY ANGIOGRAPHY;  Surgeon: Swaziland, Peter M, MD;  Location: [facility] INVASIVE CV LAB;  Service: Cardiovascular;  Laterality: N/A;   LOWER EXTREMITY ANGIOGRAPHY Bilateral 05/17/2019   Procedure: LOWER EXTREMITY ANGIOGRAPHY;  Surgeon: Harvey Carlin BRAVO, MD;  [facility]: [facility];  Service: Cardiovascular;  Laterality: Bilateral;   PARS PLANA VITRECTOMY Left 01/09/2023   Procedure: PARS PLANA VITRECTOMY WITH 25 GAUGE AND REPAIR OF RETINAL DETACHMENT;  Surgeon: Elner Arley LABOR, MD;  [facility] OR;  Service: Ophthalmology;  Laterality: Left;   REMOVAL OF A DIALYSIS CATHETER Right 01/08/2021   Procedure: REMOVAL OF TEMPORARY DIALYSIS CATHETER, LEFT FEMORAL VEIN;  Surgeon: Eliza Lonni RAMAN, MD;  Location: [facility] OR;  Service: Vascular;  Laterality: Right;   REVISON OF ARTERIOVENOUS FISTULA Right 12/14/2012   Procedure: REVISON OF upper arm ARTERIOVENOUS FISTULA using 47mmx10cm gortex graft;  Surgeon: Redell LITTIE Door, MD;  Location: [facility] OR;  Service: Vascular;  Laterality: Right;   REVISON OF ARTERIOVENOUS FISTULA Right 12/27/2020   Procedure: RIGHT UPPER EXTREMITY ARTERIOVENOUS FISTULA REVISiON;  Surgeon: Magda Debby SAILOR, MD;  Location: [facility] OR;  Service: Vascular;  Laterality: Right;  PERIPHERAL NERVE BLOCK   RIGHT/LEFT HEART CATH AND CORONARY ANGIOGRAPHY N/A 08/07/2020   Procedure: RIGHT/LEFT HEART CATH AND CORONARY ANGIOGRAPHY;  Surgeon: Swaziland, Peter M, MD;  Location: [facility] INVASIVE CV LAB;  Service: Cardiovascular;  Laterality: N/A;   SOFT TISSUE MASS EXCISION     Right arm, Left leg  for MRSA infection   VENOUS ANGIOPLASTY  12/29/2023   Procedure: VENOUS ANGIOPLASTY;  Surgeon: Norine Manuelita LABOR, MD;  Location: [facility];  Service: Cardiovascular;;  Cephalic Arch; Intragraft; Distal Edge of Stent   VENOUS ANGIOPLASTY  03/17/2024   Procedure: VENOUS ANGIOPLASTY;  Surgeon: Gretta Lonni PARAS, MD;  Location: [facility];  Service: Cardiovascular;;     Family History  Problem Relation Age of Onset   Diabetes Mother     SOCIAL HISTORY: Social History   Tobacco Use   Smoking status: Never    Passive exposure: Past   Smokeless tobacco: Never  Substance Use Topics   Alcohol  use: No    Allergies  Allergen Reactions   Crestor [Rosuvastatin]     Other reaction(s): Muscle Pain   Lipitor [Atorvastatin ]     Other reaction(s): Muscle Pain   Amoxicillin Itching    Did it involve swelling of the face/tongue/throat, SOB, or low BP? Unknown Did it involve sudden or severe rash/hives, skin peeling, or any reaction on the inside of your mouth or nose? Unknown Did you need to seek medical attention at a hospital or doctor's office? Unknown When did it last happen?      20 years ago If all above answers are NO, may proceed with cephalosporin use.    Chlorhexidine  Hives    Patient has blistering   Hydromorphone  Itching    Patient states he may take with benadryl . Makes feet itch.   Percocet [Oxycodone -Acetaminophen ] Itching and Other (See Comments)    Legs only. Itching and burning feeling. Can tolerate with hydroxyzine     Morphine      Other reaction(s): rash/itching All narcotics per pt   Gabapentin Nausea And Vomiting    Stomach issues     Current Outpatient Medications  Medication Sig Dispense Refill   aspirin  EC 81 MG tablet Take 1 tablet (81 mg total) by mouth daily. 90 tablet 3   Continuous Blood Gluc Sensor (FREESTYLE LIBRE 14 DAY SENSOR) MISC See admin instructions.     EPINEPHrine  (EPIPEN  2-PAK) 0.3 mg/0.3 mL IJ SOAJ injection Inject 0.3 mg into the muscle as needed for anaphylaxis.     isosorbide  dinitrate (ISORDIL ) 30 MG tablet Take 30 mg by mouth daily.     metoprolol  succinate (TOPROL  XL) 100 MG 24 hr tablet Take 1 tablet (100 mg total) by mouth daily. Take with or immediately following a meal. 90 tablet 3   multivitamin (RENA-VIT) TABS tablet Take 1 tablet by mouth daily with lunch.     nitroGLYCERIN  (NITROSTAT )  0.4 MG SL tablet Place 0.4 mg under the tongue every 5 (five) minutes as needed for chest pain.     pantoprazole  (PROTONIX ) 40 MG tablet Take 40 mg by mouth daily as needed (ACID REFLUX).     prednisoLONE acetate (PRED FORTE) 1 % ophthalmic suspension Place 1 drop into the right eye 4 (four) times daily.     pregabalin (LYRICA) 50 MG capsule Take 50 mg by mouth at bedtime as needed (PAIN).     sevelamer  carbonate (RENVELA ) 800 MG tablet Take  1,600 mg by mouth 3 (three) times daily with meals.     sodium zirconium cyclosilicate  (LOKELMA ) 10 g PACK packet Take 10 g by mouth See admin instructions. Non dialysis days(Tuesday,Thursday,Saturday and Sunday) as needed for high potassium     Tenapanor HCl, CKD, (XPHOZAH) 30 MG TABS Take 30 mg by mouth in the morning and at bedtime.     traZODone  (DESYREL ) 150 MG tablet Take 150 mg by mouth at bedtime as needed for sleep.     triamcinolone cream (KENALOG) 0.1 % Apply 1 Application topically 2 (two) times daily as needed (ITCHING).     No current facility-administered medications for this visit.    REVIEW OF SYSTEMS:  [X]  denotes positive finding, [ ]  denotes negative finding Cardiac  Comments:  Chest pain or chest pressure:    Shortness of breath upon exertion:    Short of breath when lying flat:    Irregular heart rhythm:        Vascular    Pain in calf, thigh, or hip brought on by ambulation:    Pain in feet at night that wakes you up from your sleep:     Blood clot in your veins:    Leg swelling:         Pulmonary    Oxygen  at home:    Productive cough:     Wheezing:         Neurologic    Sudden weakness in arms or legs:     Sudden numbness in arms or legs:     Sudden onset of difficulty speaking or slurred speech:    Temporary loss of vision in one eye:     Problems with dizziness:         Gastrointestinal    Blood in stool:     Vomited blood:         Genitourinary    Burning when urinating:     Blood in urine:         Psychiatric    Major depression:         Hematologic    Bleeding problems:    Problems with blood clotting too easily:        Skin    Rashes or ulcers:        Constitutional    Fever or chills:      PHYSICAL EXAM: There were no vitals filed for this visit.  GENERAL: The patient is a well-nourished male, in no acute distress. The vital signs are documented above. CARDIAC: There is a regular rate and rhythm.  VASCULAR:  Right arm brachiocephalic fistula with weak thrill PULMONARY: No respiratory distress. ABDOMEN: Soft and non-tender. MUSCULOSKELETAL: There are no major deformities or cyanosis. NEUROLOGIC: No focal weakness or paresthesias are detected. SKIN: There are no ulcers or rashes noted. PSYCHIATRIC: The patient has a normal affect.  DATA:   UPPER EXTREMITY VEIN MAPPING  Patient Name:  [name]  Date of Exam:   04/11/2024 Medical Rec #: [id number]      Accession #:    [id number] Date of Birth: 21-Jun-1966       Patient Gender: M Patient Age:   70 years Exam Location:  [address] Procedure:      VAS US  UPPER EXT VEIN MAPPING (PRE-OP  AVF) Referring Phys: LONNI GASKINS   --------------------------------------------------------------------------- -----   Indications: Pre-access. Patient currently has a right brachiocephalic AVF with              stent  in the cephalic arch.  Performing Technologist: Edsel Mustard RVT    Examination Guidelines: A complete evaluation includes B-mode imaging, spectral Doppler, color Doppler, and power Doppler as needed of all accessible portions of each vessel. Bilateral testing is considered an integral part of a complete examination. Limited examinations for reoccurring indications may be performed as noted.  +-----------------+-------------+----------+--------------+ Right Basilic    Diameter (cm)Depth (cm)   Findings    +-----------------+-------------+----------+--------------+ Shoulder              0.76                              +-----------------+-------------+----------+--------------+ Prox upper arm       0.65                              +-----------------+-------------+----------+--------------+ Mid upper arm        0.65                              +-----------------+-------------+----------+--------------+ Dist upper arm       0.62                              +-----------------+-------------+----------+--------------+ Antecubital fossa    0.26                 branching    +-----------------+-------------+----------+--------------+ Prox forearm         0.24                              +-----------------+-------------+----------+--------------+ Mid forearm                             not visualized +-----------------+-------------+----------+--------------+ Distal forearm                          not visualized +-----------------+-------------+----------+--------------+ Wrist                                   not visualized +-----------------+-------------+----------+--------------+  +-----------------+-------------+----------+------------------------+ Left Cephalic    Diameter (cm)Depth (cm)        Findings         +-----------------+-------------+----------+------------------------+ Shoulder             0.23                                        +-----------------+-------------+----------+------------------------+ Prox upper arm       0.29                                        +-----------------+-------------+----------+------------------------+ Mid upper arm        0.28                                        +-----------------+-------------+----------+------------------------+ Dist upper arm  0.16                                        +-----------------+-------------+----------+------------------------+ Antecubital fossa    0.15               age-indetermine  thrombus +-----------------+-------------+----------+------------------------+ Prox forearm         0.16                                        +-----------------+-------------+----------+------------------------+ Mid forearm          0.16                                        +-----------------+-------------+----------+------------------------+ Dist forearm         0.10                                        +-----------------+-------------+----------+------------------------+ Wrist                0.09                                        +-----------------+-------------+----------+------------------------+  +-----------------+-------------+----------+--------+ Left Basilic     Diameter (cm)Depth (cm)Findings +-----------------+-------------+----------+--------+ Shoulder             0.42                        +-----------------+-------------+----------+--------+ Prox upper arm       0.53                        +-----------------+-------------+----------+--------+ Mid upper arm        0.24                        +-----------------+-------------+----------+--------+ Dist upper arm       0.20                        +-----------------+-------------+----------+--------+ Antecubital fossa    0.20                        +-----------------+-------------+----------+--------+ Prox forearm         0.12                        +-----------------+-------------+----------+--------+ Mid forearm          0.15                        +-----------------+-------------+----------+--------+ Distal forearm       0.17                        +-----------------+-------------+----------+--------+ Wrist                0.16                        +-----------------+-------------+----------+--------+  Age-indeterminate thrombus visualized in the left cephalic vein from the [facility] fossa to mid forearm. Summary: Right: Right basilic vein  patent with measurements as noted above. Left: Left basilic vein patent with measurements as noted above.       Age-indeterminate thrombus visualized in the left cephalic       vein at the [facility] fossa and mid forearm. Otherwise patent with       measurements as noted above.  *See table(s) above for measurements and observations.     Diagnosing physician: Gaile New MD Electronically signed by Gaile New MD on 04/11/2024 at 4:08:22 PM.    Assessment/Plan:  58 y.o. male with history of ESRD and diabetes that presents for evaluation of his right arm AV fistula for possible new access.  Patient has a right brachiocephalic placed in 2012 by Dr. Laurence.  This is undergone revision with previous interposition.  He had multiple fistulogram's as recently as 03/17/2024 with a stenosis adjacent to the stent in the cephalic arch.  Discussed that I do think it is reasonable to consider new access after his most recent fistulogram.   The fistula is quite calcified and difficult to access and has multiple issues including in-stent stenosis limiting outflow in the cephalic arch.  I do not think further jump grafting is an option.  Since he is right-handed we discussed going to the left arm.  His vein mapping in the left arm shows small surface veins so likely would require graft with catheter placement.  He is amendable to proceed.  Will get scheduled at [facility].  Risk benefits discussed.   Lonni DOROTHA Gaskins, MD [facility] Office: [phone]

## 2024-05-10 ENCOUNTER — Ambulatory Visit: Attending: Vascular Surgery | Admitting: Vascular Surgery

## 2024-05-10 ENCOUNTER — Telehealth: Payer: Self-pay

## 2024-05-10 ENCOUNTER — Telehealth (HOSPITAL_COMMUNITY): Payer: Self-pay

## 2024-05-10 ENCOUNTER — Encounter: Payer: Self-pay | Admitting: Vascular Surgery

## 2024-05-10 VITALS — BP 132/81 | HR 56 | Temp 97.6°F | Resp 16 | Ht 70.0 in | Wt 160.0 lb

## 2024-05-10 DIAGNOSIS — N186 End stage renal disease: Secondary | ICD-10-CM | POA: Insufficient documentation

## 2024-05-10 DIAGNOSIS — Z992 Dependence on renal dialysis: Secondary | ICD-10-CM | POA: Insufficient documentation

## 2024-05-10 NOTE — Telephone Encounter (Signed)
 Detailed instructions left on the patient's answering machine. Asked to call back with any questions. CCT

## 2024-05-10 NOTE — Telephone Encounter (Signed)
 Patient stated he would call office to schedule surgery with Dr. Gretta.  Left AVF vs AVG + TDC placement.

## 2024-05-17 ENCOUNTER — Ambulatory Visit (HOSPITAL_BASED_OUTPATIENT_CLINIC_OR_DEPARTMENT_OTHER)
Admission: RE | Admit: 2024-05-17 | Discharge: 2024-05-17 | Disposition: A | Discharge status: Home / Self Care | Source: Ambulatory Visit | Attending: Cardiology | Admitting: Cardiology

## 2024-05-17 ENCOUNTER — Ambulatory Visit (HOSPITAL_COMMUNITY)
Admission: RE | Admit: 2024-05-17 | Discharge: 2024-05-17 | Disposition: A | Discharge status: Home / Self Care | Source: Ambulatory Visit | Attending: Internal Medicine | Admitting: Internal Medicine

## 2024-05-17 DIAGNOSIS — R011 Cardiac murmur, unspecified: Secondary | ICD-10-CM

## 2024-05-17 DIAGNOSIS — I2510 Atherosclerotic heart disease of native coronary artery without angina pectoris: Secondary | ICD-10-CM | POA: Insufficient documentation

## 2024-05-17 LAB — ECHOCARDIOGRAM COMPLETE
AR max vel: 0.95 cm2
AV Area VTI: 0.96 cm2
AV Area mean vel: 0.93 cm2
AV Mean grad: 9.8 mmHg
AV Peak grad: 19.2 mmHg
Ao pk vel: 2.19 m/s
Area-P 1/2: 4.74 cm2
MV M vel: 5.7 m/s
MV Peak grad: 130 mmHg
S' Lateral: 4.3 cm

## 2024-05-17 LAB — MYOCARDIAL PERFUSION IMAGING
LV dias vol: 262 mL (ref 62–150)
LV sys vol: 179 mL (ref 4.2–5.8)
Nuc Stress EF: 32 %
Peak HR: 74 {beats}/min
Rest HR: 69 {beats}/min
Rest Nuclear Isotope Dose: 10.4 mCi
SDS: -1
SRS: 25
SSS: 24
ST Depression (mm): 0 mm
TID: 1.04

## 2024-05-17 MED ORDER — TECHNETIUM TC 99M TETROFOSMIN IV KIT
31.5000 | PACK | Freq: Once | INTRAVENOUS | Status: AC | PRN
  Administered 2024-05-17: 31.5 via INTRAVENOUS

## 2024-05-17 MED ORDER — REGADENOSON 0.4 MG/5ML IV SOLN
0.4000 mg | Freq: Once | INTRAVENOUS | Status: AC
  Administered 2024-05-17: 0.4 mg via INTRAVENOUS

## 2024-05-17 MED ORDER — TECHNETIUM TC 99M TETROFOSMIN IV KIT
10.4000 | PACK | Freq: Once | INTRAVENOUS | Status: AC | PRN
Start: 2024-05-17 — End: 2024-05-17
  Administered 2024-05-17: 10.4 via INTRAVENOUS

## 2024-05-17 MED ORDER — REGADENOSON 0.4 MG/5ML IV SOLN
INTRAVENOUS | Status: AC
  Filled 2024-05-17: qty 5

## 2024-05-17 NOTE — Progress Notes (Signed)
 Severely reduced ejection fraction.  Stress test results are pending.  Recommend office visit to further discuss abnormal echocardiogram findings. Can see him next week.  Thanks MJP

## 2024-05-18 ENCOUNTER — Other Ambulatory Visit: Payer: Self-pay | Admitting: Nephrology

## 2024-05-18 DIAGNOSIS — N281 Cyst of kidney, acquired: Secondary | ICD-10-CM

## 2024-05-19 ENCOUNTER — Ambulatory Visit
Admission: RE | Admit: 2024-05-19 | Discharge: 2024-05-19 | Disposition: A | Discharge status: Home / Self Care | Source: Ambulatory Visit | Attending: Nephrology | Admitting: Nephrology

## 2024-05-19 ENCOUNTER — Telehealth: Payer: Self-pay | Admitting: Cardiology

## 2024-05-19 DIAGNOSIS — E785 Hyperlipidemia, unspecified: Secondary | ICD-10-CM

## 2024-05-19 DIAGNOSIS — N281 Cyst of kidney, acquired: Secondary | ICD-10-CM

## 2024-05-19 NOTE — Telephone Encounter (Signed)
 Patient calling in for lab results. Made patient aware of lab results and recommendations. Made patient aware that order already placed for Referral to [facility] for reconsideration of Repatha. Made patient aware that someone will reach out to schedule that appointment. Patient verbalized an understanding.

## 2024-05-19 NOTE — Telephone Encounter (Signed)
Pt returning nurses call from yesterday regarding test results. Please advise 

## 2024-05-20 ENCOUNTER — Other Ambulatory Visit

## 2024-05-26 ENCOUNTER — Ambulatory Visit: Attending: Cardiology | Admitting: Cardiology

## 2024-05-26 ENCOUNTER — Encounter: Payer: Self-pay | Admitting: Cardiology

## 2024-05-26 ENCOUNTER — Other Ambulatory Visit (HOSPITAL_COMMUNITY): Payer: Self-pay

## 2024-05-26 VITALS — BP 146/86 | HR 61 | Ht 70.0 in | Wt 160.7 lb

## 2024-05-26 DIAGNOSIS — I2510 Atherosclerotic heart disease of native coronary artery without angina pectoris: Secondary | ICD-10-CM | POA: Diagnosis present

## 2024-05-26 DIAGNOSIS — I350 Nonrheumatic aortic (valve) stenosis: Secondary | ICD-10-CM | POA: Insufficient documentation

## 2024-05-26 DIAGNOSIS — I5020 Unspecified systolic (congestive) heart failure: Secondary | ICD-10-CM | POA: Diagnosis present

## 2024-05-26 MED ORDER — SACUBITRIL-VALSARTAN 24-26 MG PO TABS
1.0000 | ORAL_TABLET | Freq: Two times a day (BID) | ORAL | 3 refills | Status: DC
  Filled 2024-05-26: qty 60, 30d supply, fill #0

## 2024-05-26 NOTE — Progress Notes (Signed)
 Cardiology Office Note:  .   Date:  05/26/2024  ID:  [name], DOB [birth year], MRN [id number] PCP: Shelda Atlas, MD  [facility] Providers Cardiologist:  Newman Lawrence, MD PCP: Shelda Atlas, MD  Chief Complaint  Patient presents with   Coronary Artery Disease   Hyperlipidemia   Hypertension     DEMORRIS CHOYCE is a 58 y.o. male with hypertension, hyperlipidemia, diabetes, CAD, HFrEF, PAD  s/p left BKA & right transmetatarsal potation, ESRD on HD  History of Present Illness  Patient has known severe multivessel CAD with prior PCI and ISR in LAD.  He was hospitalized at [facility] in 2022.  There were no good targets for revascularization as per discussion then.  He was then seen at [facility], last in 04/2022.  It was unclear if bypass surgery option was discussed with Dr. Hershal at [facility],.  Patient is here today with his wife.  Patient is scheduled to undergo fistula revision surgery on Monday, 05/30/2024 by Dr. Gretta.  He is also being to undergo bilateral inguinal hernia surgery in the near future.  He has exertional dyspnea symptoms, denies any edema.  Recent echocardiogram shows severely reduced LVEF, moderate to severe TR, mild to moderate MR, with stress test largely showing infarct without any significant ischemia.  He has had recent lower chest pai, unrleated to exertion, lasting for an hour so.     Vitals:   05/26/24 1106  BP: (!) 146/86  Pulse: 61  SpO2: 98%       Review of Systems  Cardiovascular:  Positive for dyspnea on exertion. Negative for chest pain, leg swelling, palpitations and syncope.        Studies Reviewed: SABRA        EKG 03/30/2024: Normal sinus rhythm Left ventricular hypertrophy with repolarization abnormality ( Cornell product ) When compared with ECG of 09-Jan-2023 13:06, Questionable change in initial forces of Lateral leads    Echocardiogram 04/2024:  1. Left ventricular ejection fraction, by estimation, is 30 to  35%. Left  ventricular ejection fraction by 3D volume is 30 %. The left ventricle has  moderately decreased function. The left ventricle demonstrates global  hypokinesis. Left ventricular diastolic function could not be evaluated. The average left ventricular global longitudinal strain is -11.2 %.  The global longitudinal strain is abnormal.   2. Right ventricular systolic function is normal. The right ventricular  size is mildly enlarged.   3. Left atrial size was moderately dilated.   4. A small pericardial effusion is present. The pericardial effusion is  circumferential.   5. The mitral valve is normal in structure. Mild to moderate mitral valve  regurgitation. No evidence of mitral stenosis. Moderate to severe mitral  annular calcification.   6. Tricuspid valve regurgitation is severe.   7. The aortic valve is calcified. There is moderate calcification of the  aortic valve. There is moderate thickening of the aortic valve. Aortic  valve regurgitation is not visualized. Low flow low gradient moderate to  severe. Aortic valve area, by VTI measures 0.96 cm. Aortic valve  mean gradient measures 9.8 mmHg. Aortic valve Vmax measures 2.14 m/s, SVI 22, DI 0.34, Indexed AVA 0.51 cm2/m2.   8. The inferior vena cava is normal in size with greater than 50%  respiratory variability, suggesting right atrial pressure of 3 mmHg.   Stress test 04/2024:   Findings are consistent with infarction with peri-infarct ischemia. The study is high risk.   No ST deviation was  noted. The ECG was not diagnostic due to pharmacologic protocol.   LV perfusion is abnormal. There is no evidence of ischemia. There is evidence of infarction. Defect 1: There is a large defect with severe reduction in uptake present in the apical to basal inferior, inferolateral and apex location(s) that is fixed. There is abnormal wall motion in the defect area. Consistent with infarction and peri-infarct ischemia.   Left ventricular  function is abnormal. Global function is moderately reduced. There was a single regional abnormality. Nuclear stress EF: 32%. The left ventricular ejection fraction is moderately decreased (30-44%). End diastolic cavity size is severely enlarged. End systolic cavity size is severely enlarged.   Prior study available for comparison from 09/09/2013.   ECG rhythm shows normal sinus rhythm.   Large size, severe severity fixed basal to apical inferior, inferolateral and apical perfusion defect, suggestive of LCX territory scar without significant peri-infarct ischemia. LVEF 32% with inferolateral and apical akinesis. This is high risk study (due to LVEF <35% and large territory of scar). Compared to a prior study in 2014, the LVEF is lower and new regional perfusion defects are noted. This correlates with inferior and inferolateral akinesis on recent echo (my read).     Labs 04/2023: Chol 156, TG 114, HDL 78, LDL 58 HbA1C 6.6% Hb 10.6 Cr 10.4 TSH 3.5  Coronary angiogram 08/2021: 1. .  The proximal LAD has diffuse mild disease.  Within the mid LAD there is a long segment of stent with modest 30 to 40% in-stent restenosis.  The apical LAD is subtotally occluded.  It is a very small vessel in the segment.  There is a very small first   diagonal branch which is also subtotally occluded.  A medium sized second diagonal branch has a stent in its ostial segment with approximately 80% in-stent restenosis.  There is an inferior branch of this diagonal branch which is occluded and fills via  left to left collaterals.  There is a small ramus branch which has diffuse 90% disease as well as a small OM1 branch of the left circumflex which has diffuse 90% disease.  Within the mid circumflex there is mild to moderate disease and severe disease in  the distal circumflex which is also a small vessel.  The RCA has nonobstructive disease with a widely patent stent in the PL branch.  2.  Severely elevated LVEDP  3.   Severe systemic hypertension, patient was given multiple doses of medications including labetalol  and hydralazine  to address the blood pressure  4.  Despite premedication for possible allergy, the patient developed severe pruritus of his lower legs, treated with additional Benadryl  and steroids.  No other signs of any systemic allergic reaction were present.  5.  Moderate sedation with IV Benadryl , fentanyl , and Versed  for greater than 20 minutes was personally supervised by me with no sedation issues     Recommendations:   1. Left femoral arterial sheath will be removed when blood pressure has improved  2.  No good targets for PCI identified.  Would recommend continued aggressive risk factor modification medical therapy     Physical Exam Vitals and nursing note reviewed.  Constitutional:      General: He is not in acute distress. Neck:     Vascular: No JVD.  Cardiovascular:     Rate and Rhythm: Normal rate and regular rhythm.     Heart sounds: Normal heart sounds. No murmur heard. Pulmonary:     Effort: Pulmonary effort is normal.  Breath sounds: Normal breath sounds. No wheezing or rales.  Musculoskeletal:     Right lower leg: Edema (Trace) present.     Comments: Left BKA, right transmetatarsal amputation      VISIT DIAGNOSES:   ICD-10-CM   1. HFrEF (heart failure with reduced ejection fraction) (HCC)  I50.20 CBC    Basic metabolic panel with GFR    Basic metabolic panel with GFR    2. Coronary artery disease involving native coronary artery of native heart without angina pectoris  I25.10     3. Nonrheumatic aortic valve stenosis  I35.0         ORIEN MAYHALL is a 58 y.o. male with hypertension, hyperlipidemia, diabetes, CAD, HFrEF, PAD  s/p left BKA & right transmetatarsal potation, ESRD on HD Assessment & Plan  HFrEF: New diagnosis.  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  Valvular regurgitation  likely due to cardiomyopathy. With upcoming surgeries, I would ideally recommend right and left heart catheterization and assessing baseline hemodynamics.  He will need GDMT for HFrEF, will start with Entresto  24-26 mg twice daily after his cardiac catheterization.  Will arrange follow-up BMP a week after that and follow-up with Pharm.D. for GDMT up titration. Continue metoprolol  succinate 50 mg daily. I will request Dr. Gretta to reschedule his fistula surgery after his heart catheterization, if that is medically reasonable.  CAD:  Known extensive CAD with no options for revascularization as of 2022. Recent chest pain less likely to be angina.  I repeat cardiac catheterization more from heart failure or invasive hemodynamic assessment standpoint.  I do not think any further intervention is likely to benefit him.  Continue aspirin  81 mg daily. Patient has appointment with lipid clinic soon to restart on Repatha. Continue metoprolol  succinate 100 mg daily. DM management as per PCP, currently not on on any therapy, but controlled diabetes.  PAD: Continue Aspirin  See above regarding lipid lowering therapy.   Aortic stenosis: While low-flow gradient moderate to severe aortic stenosis cannot be ruled out, it is quite unlikely that aortic valve is the etiology of his cardiomyopathy.  I anticipate aortic valve area may improve if his EF improves with GDMT for HFrEF. Will repeat echocardiogram in 6 months. (Will order at next visit).    Informed Consent   Shared Decision Making/Informed Consent The risks [stroke (1 in 1000), death (1 in 1000), kidney failure [usually temporary] (1 in 500), bleeding (1 in 200), allergic reaction [possibly serious] (1 in 200)], benefits (diagnostic support and management of coronary artery disease) and alternatives of a cardiac catheterization were discussed in detail with Mr. Helbert and he is willing to proceed.      Meds ordered this encounter  Medications    sacubitril -valsartan  (ENTRESTO ) 24-26 MG    Sig: Take 1 tablet by mouth 2 (two) times daily. START AFTER HEART CATH    Dispense:  60 tablet    Refill:  3     F/u in 3 months   Signed, [name], MD

## 2024-05-26 NOTE — Patient Instructions (Signed)
 Medication Instructions:  START AFTER HEART CATH: Entresto  24-26 mg take one tablet twice daily   *If you need a refill on your cardiac medications before your next appointment, please call your pharmacy*  Lab Work: CBC- today BMP- today  BMP on 06/06/24  If you have labs (blood work) drawn today and your tests are completely normal, you will receive your results only by: MyChart Message (if you have MyChart) OR A paper copy in the mail If you have any lab test that is abnormal or we need to change your treatment, we will call you to review the results.  Testing/Procedures: RIGHT AND LEFT HEART CATH   Your physician has requested that you have a cardiac catheterization. Cardiac catheterization is used to diagnose and/or treat various heart conditions. Doctors may recommend this procedure for a number of different reasons. The most common reason is to evaluate chest pain. Chest pain can be a symptom of coronary artery disease (CAD), and cardiac catheterization can show whether plaque is narrowing or blocking your heart's arteries. This procedure is also used to evaluate the valves, as well as measure the blood flow and oxygen  levels in different parts of your heart. For further information please visit [email or web address] Please follow instruction sheet, as given.   Follow-Up: At [facility], you and your health needs are our priority.  As part of our continuing mission to provide you with exceptional heart care, our providers are all part of one team.  This team includes your primary Cardiologist (physician) and Advanced Practice Providers or APPs (Physician Assistants and Nurse Practitioners) who all work together to provide you with the care you need, when you need it.  Your next appointment:   2 WEEK POST CATH FOLLOW UP WITH APP  3 MONTHS FOLLOW UP WITH DR. PATWARDHAN   KEEP APPOINTMENT WITH PHARM-D    Other Instructions       Cardiac/Peripheral  Catheterization   You are scheduled for a Cardiac Catheterization on Tuesday, August 12 with Dr. Newman Lawrence.  1. Please arrive at the [facility] (Main Entrance A) at [facility]: [address] [name], [place] [address] at 10:00 AM (This time is 2 hour(s) before your procedure to ensure your preparation).   Free valet parking service is available. You will check in at ADMITTING. The support person will be asked to wait in the waiting room.  It is OK to have someone drop you off and come back when you are ready to be discharged.        Special note: Every effort is made to have your procedure done on time. Please understand that emergencies sometimes delay scheduled procedures.  2. Diet: Light meals may be eaten up to 6 hours before scheduled procedures from 12N and after; please stop eating at 6:00 AM   Light meal consist of plain toast, fruit, light soups, crackers.  3. Hydration: You may drink approved liquids (see below) until you arrive at the hospital.        List of approved liquids water , clear juice, clear tea, black coffee, fruit juices, non-citric and without pulp, carbonated beverages, Gatorade, Kool -Aid, plain Jello-O and plain ice popsicles.        4. Labs: You will need to have blood drawn today (8/7).   5. Medication instructions in preparation for your procedure:     On the morning of your procedure, take Aspirin  81 mg and any morning medicines NOT listed above.  You may use sips  of water .  6. Plan to go home the same day, you will only stay overnight if medically necessary. 7. You MUST have a responsible adult to drive you home. 8. An adult MUST be with you the first 24 hours after you arrive home. 9. Bring a current list of your medications, and the last time and date medication taken. 10. Bring ID and current insurance cards. 11.Please wear clothes that are easy to get on and off and wear slip-on shoes.  Thank you for allowing us  to care for  you!   -- [facility]

## 2024-05-27 ENCOUNTER — Telehealth: Payer: Self-pay

## 2024-05-27 LAB — BASIC METABOLIC PANEL WITH GFR
BUN/Creatinine Ratio: 5 — ABNORMAL LOW (ref 9–20)
BUN: 50 mg/dL — ABNORMAL HIGH (ref 6–24)
CO2: 19 mmol/L — ABNORMAL LOW (ref 20–29)
Calcium: 9.8 mg/dL (ref 8.7–10.2)
Chloride: 93 mmol/L — ABNORMAL LOW (ref 96–106)
Creatinine, Ser: 9.68 mg/dL — ABNORMAL HIGH (ref 0.76–1.27)
Glucose: 134 mg/dL — ABNORMAL HIGH (ref 70–99)
Potassium: 5.3 mmol/L — ABNORMAL HIGH (ref 3.5–5.2)
Sodium: 137 mmol/L (ref 134–144)
eGFR: 6 mL/min/1.73 — ABNORMAL LOW (ref >59)

## 2024-05-27 LAB — CBC
Hematocrit: 34 % — ABNORMAL LOW (ref 37.5–51.0)
Hemoglobin: 10.9 g/dL — ABNORMAL LOW (ref 13.0–17.7)
MCH: 27.9 pg (ref 26.6–33.0)
MCHC: 32.1 g/dL (ref 31.5–35.7)
MCV: 87 fL (ref 79–97)
Platelets: 107 x10E3/uL — ABNORMAL LOW (ref 150–450)
RBC: 3.9 x10E6/uL — ABNORMAL LOW (ref 4.14–5.80)
RDW: 14.7 % (ref 11.6–15.4)
WBC: 3.8 x10E3/uL (ref 3.4–10.8)

## 2024-05-27 NOTE — Telephone Encounter (Signed)
 Per Dr. Elmira, canceled AV shunt intervention due to needing heart cath on 05/31/24. Pt contacted and advised. Pt will contact to reschedule with Dr. Magda. Dr. Elmira aware.

## 2024-05-30 ENCOUNTER — Ambulatory Visit (HOSPITAL_COMMUNITY): Admission: RE | Admit: 2024-05-30 | Source: Home / Self Care | Admitting: Vascular Surgery

## 2024-05-30 ENCOUNTER — Encounter (HOSPITAL_COMMUNITY): Admission: RE | Payer: Self-pay | Source: Home / Self Care

## 2024-05-30 ENCOUNTER — Telehealth: Payer: Self-pay | Admitting: *Deleted

## 2024-05-30 SURGERY — A/V SHUNT INTERVENTION
Anesthesia: LOCAL | Site: Arm Upper | Laterality: Right

## 2024-05-30 NOTE — Telephone Encounter (Signed)
 Cardiac Catheterization scheduled at [facility] for: Tuesday May 31, 2024 12 Noon Arrival time [facility] Main Entrance A at: 10 AM  Diet: -May have light meal until 6 AM. (6 hours before procedure time) Approved light meal consists of plain toast, fruit, light soups, crackers.  Hydration: -May drink clear liquids until leaving for hospital. Approved liquids: Water , clear tea, black coffee, fruit juices-non-citric and without pulp,Gatorade, plain Jello/popsicles. Dialysis-no PO hydration (bottle of water ) OTW to hospital  Medication instructions: -Usual morning medications can be taken including aspirin  81 mg.  Plan to go home the same day, you will only stay overnight if medically necessary.  You must have responsible adult to drive you home.  Someone must be with you the first 24 hours after you arrive home.  Confirmed MWF dialysis schedule with patient.  Reviewed procedure instructions with patient.

## 2024-05-31 ENCOUNTER — Other Ambulatory Visit (HOSPITAL_COMMUNITY): Payer: Self-pay

## 2024-05-31 ENCOUNTER — Other Ambulatory Visit: Payer: Self-pay

## 2024-05-31 ENCOUNTER — Encounter (HOSPITAL_COMMUNITY): Payer: Self-pay | Admitting: Cardiology

## 2024-05-31 ENCOUNTER — Telehealth (HOSPITAL_COMMUNITY): Payer: Self-pay | Admitting: Pharmacy Technician

## 2024-05-31 ENCOUNTER — Inpatient Hospital Stay (HOSPITAL_COMMUNITY)
Admission: RE | Admit: 2024-05-31 | Discharge: 2024-06-08 | DRG: 264 | Disposition: A | Discharge status: Home / Self Care | Attending: Internal Medicine | Admitting: Internal Medicine

## 2024-05-31 ENCOUNTER — Encounter (HOSPITAL_COMMUNITY)
Admission: RE | Disposition: A | Discharge status: Home / Self Care | Payer: Self-pay | Source: Home / Self Care | Attending: Internal Medicine

## 2024-05-31 DIAGNOSIS — Z880 Allergy status to penicillin: Secondary | ICD-10-CM

## 2024-05-31 DIAGNOSIS — T829XXA Unspecified complication of cardiac and vascular prosthetic device, implant and graft, initial encounter: Secondary | ICD-10-CM

## 2024-05-31 DIAGNOSIS — E875 Hyperkalemia: Secondary | ICD-10-CM | POA: Diagnosis not present

## 2024-05-31 DIAGNOSIS — N2581 Secondary hyperparathyroidism of renal origin: Secondary | ICD-10-CM | POA: Diagnosis present

## 2024-05-31 DIAGNOSIS — E785 Hyperlipidemia, unspecified: Secondary | ICD-10-CM | POA: Diagnosis present

## 2024-05-31 DIAGNOSIS — I708 Atherosclerosis of other arteries: Secondary | ICD-10-CM | POA: Diagnosis not present

## 2024-05-31 DIAGNOSIS — N185 Chronic kidney disease, stage 5: Principal | ICD-10-CM

## 2024-05-31 DIAGNOSIS — I2722 Pulmonary hypertension due to left heart disease: Secondary | ICD-10-CM | POA: Diagnosis present

## 2024-05-31 DIAGNOSIS — I509 Heart failure, unspecified: Secondary | ICD-10-CM | POA: Diagnosis not present

## 2024-05-31 DIAGNOSIS — Z8614 Personal history of Methicillin resistant Staphylococcus aureus infection: Secondary | ICD-10-CM

## 2024-05-31 DIAGNOSIS — Z992 Dependence on renal dialysis: Secondary | ICD-10-CM

## 2024-05-31 DIAGNOSIS — T82510A Breakdown (mechanical) of surgically created arteriovenous fistula, initial encounter: Secondary | ICD-10-CM | POA: Diagnosis present

## 2024-05-31 DIAGNOSIS — D509 Iron deficiency anemia, unspecified: Secondary | ICD-10-CM | POA: Diagnosis present

## 2024-05-31 DIAGNOSIS — I132 Hypertensive heart and chronic kidney disease with heart failure and with stage 5 chronic kidney disease, or end stage renal disease: Secondary | ICD-10-CM | POA: Diagnosis present

## 2024-05-31 DIAGNOSIS — E1151 Type 2 diabetes mellitus with diabetic peripheral angiopathy without gangrene: Secondary | ICD-10-CM | POA: Diagnosis present

## 2024-05-31 DIAGNOSIS — Z885 Allergy status to narcotic agent status: Secondary | ICD-10-CM

## 2024-05-31 DIAGNOSIS — E1122 Type 2 diabetes mellitus with diabetic chronic kidney disease: Secondary | ICD-10-CM | POA: Diagnosis present

## 2024-05-31 DIAGNOSIS — I10 Essential (primary) hypertension: Secondary | ICD-10-CM | POA: Diagnosis present

## 2024-05-31 DIAGNOSIS — Z79899 Other long term (current) drug therapy: Secondary | ICD-10-CM | POA: Diagnosis not present

## 2024-05-31 DIAGNOSIS — Z888 Allergy status to other drugs, medicaments and biological substances status: Secondary | ICD-10-CM

## 2024-05-31 DIAGNOSIS — I2510 Atherosclerotic heart disease of native coronary artery without angina pectoris: Secondary | ICD-10-CM | POA: Diagnosis present

## 2024-05-31 DIAGNOSIS — R9431 Abnormal electrocardiogram [ECG] [EKG]: Secondary | ICD-10-CM | POA: Diagnosis present

## 2024-05-31 DIAGNOSIS — T82898A Other specified complication of vascular prosthetic devices, implants and grafts, initial encounter: Secondary | ICD-10-CM | POA: Diagnosis not present

## 2024-05-31 DIAGNOSIS — Z89511 Acquired absence of right leg below knee: Secondary | ICD-10-CM | POA: Diagnosis not present

## 2024-05-31 DIAGNOSIS — I739 Peripheral vascular disease, unspecified: Secondary | ICD-10-CM | POA: Diagnosis present

## 2024-05-31 DIAGNOSIS — I5023 Acute on chronic systolic (congestive) heart failure: Secondary | ICD-10-CM | POA: Diagnosis present

## 2024-05-31 DIAGNOSIS — I2721 Secondary pulmonary arterial hypertension: Secondary | ICD-10-CM | POA: Diagnosis not present

## 2024-05-31 DIAGNOSIS — Y832 Surgical operation with anastomosis, bypass or graft as the cause of abnormal reaction of the patient, or of later complication, without mention of misadventure at the time of the procedure: Secondary | ICD-10-CM | POA: Diagnosis present

## 2024-05-31 DIAGNOSIS — Z881 Allergy status to other antibiotic agents status: Secondary | ICD-10-CM

## 2024-05-31 DIAGNOSIS — Z833 Family history of diabetes mellitus: Secondary | ICD-10-CM | POA: Diagnosis not present

## 2024-05-31 DIAGNOSIS — N186 End stage renal disease: Secondary | ICD-10-CM | POA: Diagnosis present

## 2024-05-31 DIAGNOSIS — I350 Nonrheumatic aortic (valve) stenosis: Secondary | ICD-10-CM | POA: Diagnosis present

## 2024-05-31 DIAGNOSIS — Z89512 Acquired absence of left leg below knee: Secondary | ICD-10-CM

## 2024-05-31 DIAGNOSIS — E119 Type 2 diabetes mellitus without complications: Secondary | ICD-10-CM

## 2024-05-31 DIAGNOSIS — I255 Ischemic cardiomyopathy: Secondary | ICD-10-CM | POA: Diagnosis present

## 2024-05-31 DIAGNOSIS — Z7982 Long term (current) use of aspirin: Secondary | ICD-10-CM | POA: Diagnosis not present

## 2024-05-31 DIAGNOSIS — I5020 Unspecified systolic (congestive) heart failure: Secondary | ICD-10-CM | POA: Diagnosis present

## 2024-05-31 DIAGNOSIS — Z955 Presence of coronary angioplasty implant and graft: Secondary | ICD-10-CM

## 2024-05-31 DIAGNOSIS — I272 Pulmonary hypertension, unspecified: Secondary | ICD-10-CM | POA: Diagnosis present

## 2024-05-31 HISTORY — PX: RIGHT/LEFT HEART CATH AND CORONARY ANGIOGRAPHY: CATH118266

## 2024-05-31 LAB — MAGNESIUM: Magnesium: 2.5 mg/dL — ABNORMAL HIGH (ref 1.7–2.4)

## 2024-05-31 LAB — CBC WITH DIFFERENTIAL/PLATELET
Abs Immature Granulocytes: 0.01 K/uL (ref 0.00–0.07)
Basophils Absolute: 0 K/uL (ref 0.0–0.1)
Basophils Relative: 1 %
Eosinophils Absolute: 0 K/uL (ref 0.0–0.5)
Eosinophils Relative: 1 %
HCT: 32.8 % — ABNORMAL LOW (ref 39.0–52.0)
Hemoglobin: 10.5 g/dL — ABNORMAL LOW (ref 13.0–17.0)
Immature Granulocytes: 0 %
Lymphocytes Relative: 8 %
Lymphs Abs: 0.3 K/uL — ABNORMAL LOW (ref 0.7–4.0)
MCH: 27.6 pg (ref 26.0–34.0)
MCHC: 32 g/dL (ref 30.0–36.0)
MCV: 86.1 fL (ref 80.0–100.0)
Monocytes Absolute: 0.1 K/uL (ref 0.1–1.0)
Monocytes Relative: 3 %
Neutro Abs: 3.2 K/uL (ref 1.7–7.7)
Neutrophils Relative %: 87 %
Platelets: 80 K/uL — ABNORMAL LOW (ref 150–400)
RBC: 3.81 MIL/uL — ABNORMAL LOW (ref 4.22–5.81)
RDW: 14.5 % (ref 11.5–15.5)
WBC: 3.6 K/uL — ABNORMAL LOW (ref 4.0–10.5)
nRBC: 0 % (ref 0.0–0.2)

## 2024-05-31 LAB — POCT I-STAT EG7
Acid-Base Excess: 0 mmol/L (ref 0.0–2.0)
Bicarbonate: 26 mmol/L (ref 20.0–28.0)
Calcium, Ion: 1.2 mmol/L (ref 1.15–1.40)
HCT: 31 % — ABNORMAL LOW (ref 39.0–52.0)
Hemoglobin: 10.5 g/dL — ABNORMAL LOW (ref 13.0–17.0)
O2 Saturation: 41 %
Potassium: 5.5 mmol/L — ABNORMAL HIGH (ref 3.5–5.1)
Sodium: 136 mmol/L (ref 135–145)
TCO2: 27 mmol/L (ref 22–32)
pCO2, Ven: 46.6 mmHg (ref 44–60)
pH, Ven: 7.355 (ref 7.25–7.43)
pO2, Ven: 25 mmHg — CL (ref 32–45)

## 2024-05-31 LAB — POCT I-STAT 7, (LYTES, BLD GAS, ICA,H+H)
Acid-base deficit: 2 mmol/L (ref 0.0–2.0)
Bicarbonate: 21.4 mmol/L (ref 20.0–28.0)
Calcium, Ion: 1.17 mmol/L (ref 1.15–1.40)
HCT: 31 % — ABNORMAL LOW (ref 39.0–52.0)
Hemoglobin: 10.5 g/dL — ABNORMAL LOW (ref 13.0–17.0)
O2 Saturation: 96 %
Potassium: 5.5 mmol/L — ABNORMAL HIGH (ref 3.5–5.1)
Sodium: 136 mmol/L (ref 135–145)
TCO2: 22 mmol/L (ref 22–32)
pCO2 arterial: 31.1 mmHg — ABNORMAL LOW (ref 32–48)
pH, Arterial: 7.446 (ref 7.35–7.45)
pO2, Arterial: 76 mmHg — ABNORMAL LOW (ref 83–108)

## 2024-05-31 LAB — COMPREHENSIVE METABOLIC PANEL WITH GFR
ALT: 15 U/L (ref 0–44)
AST: 23 U/L (ref 15–41)
Albumin: 3.9 g/dL (ref 3.5–5.0)
Alkaline Phosphatase: 102 U/L (ref 38–126)
Anion gap: 16 — ABNORMAL HIGH (ref 5–15)
BUN: 43 mg/dL — ABNORMAL HIGH (ref 6–20)
CO2: 20 mmol/L — ABNORMAL LOW (ref 22–32)
Calcium: 9.4 mg/dL (ref 8.9–10.3)
Chloride: 98 mmol/L (ref 98–111)
Creatinine, Ser: 8.89 mg/dL — ABNORMAL HIGH (ref 0.61–1.24)
GFR, Estimated: 6 mL/min — ABNORMAL LOW (ref >60)
Glucose, Bld: 206 mg/dL — ABNORMAL HIGH (ref 70–99)
Potassium: 6.2 mmol/L — ABNORMAL HIGH (ref 3.5–5.1)
Sodium: 134 mmol/L — ABNORMAL LOW (ref 135–145)
Total Bilirubin: 1.1 mg/dL (ref 0.0–1.2)
Total Protein: 6.9 g/dL (ref 6.5–8.1)

## 2024-05-31 LAB — GLUCOSE, CAPILLARY
Glucose-Capillary: 188 mg/dL — ABNORMAL HIGH (ref 70–99)
Glucose-Capillary: 300 mg/dL — ABNORMAL HIGH (ref 70–99)

## 2024-05-31 LAB — HIV ANTIBODY (ROUTINE TESTING W REFLEX): HIV Screen 4th Generation wRfx: NONREACTIVE

## 2024-05-31 LAB — HEPATITIS B SURFACE ANTIGEN: Hepatitis B Surface Ag: NONREACTIVE

## 2024-05-31 MED ORDER — FAMOTIDINE IN NACL 20-0.9 MG/50ML-% IV SOLN
INTRAVENOUS | Status: AC | PRN
  Administered 2024-05-31 (×2): 20 mg via INTRAVENOUS

## 2024-05-31 MED ORDER — HYDROXYZINE HCL 50 MG PO TABS
50.0000 mg | ORAL_TABLET | Freq: Four times a day (QID) | ORAL | Status: DC | PRN
  Administered 2024-06-02 – 2024-06-07 (×6): 50 mg via ORAL
  Filled 2024-05-31 (×10): qty 1

## 2024-05-31 MED ORDER — LIDOCAINE HCL (PF) 1 % IJ SOLN
INTRAMUSCULAR | Status: AC
  Filled 2024-05-31: qty 30

## 2024-05-31 MED ORDER — INSULIN ASPART 100 UNIT/ML IJ SOLN
0.0000 [IU] | Freq: Three times a day (TID) | INTRAMUSCULAR | Status: DC
  Administered 2024-06-04: 1 [IU] via SUBCUTANEOUS
  Administered 2024-06-04 – 2024-06-05 (×2): 2 [IU] via SUBCUTANEOUS
  Administered 2024-06-05: 1 [IU] via SUBCUTANEOUS

## 2024-05-31 MED ORDER — FAMOTIDINE IN NACL 20-0.9 MG/50ML-% IV SOLN
INTRAVENOUS | Status: AC
  Filled 2024-05-31: qty 50

## 2024-05-31 MED ORDER — FREE WATER
250.0000 mL | Freq: Once | Status: AC
  Administered 2024-05-31 (×2): 250 mL via ORAL

## 2024-05-31 MED ORDER — SODIUM CHLORIDE 0.9 % IV SOLN: 250.0000 mL | INTRAVENOUS | Status: AC | PRN

## 2024-05-31 MED ORDER — SODIUM CHLORIDE 0.9% FLUSH: 3.0000 mL | INTRAVENOUS | Status: DC | PRN

## 2024-05-31 MED ORDER — LABETALOL HCL 5 MG/ML IV SOLN: 10.0000 mg | INTRAVENOUS | Status: AC | PRN

## 2024-05-31 MED ORDER — FENTANYL CITRATE (PF) 100 MCG/2ML IJ SOLN
INTRAMUSCULAR | Status: AC
Start: 2024-05-31 — End: 2024-05-31
  Filled 2024-05-31: qty 2

## 2024-05-31 MED ORDER — PANTOPRAZOLE SODIUM 40 MG PO TBEC
40.0000 mg | DELAYED_RELEASE_TABLET | Freq: Every day | ORAL | Status: DC
  Administered 2024-06-01 – 2024-06-08 (×8): 40 mg via ORAL
  Filled 2024-05-31 (×7): qty 1

## 2024-05-31 MED ORDER — PREDNISOLONE ACETATE 1 % OP SUSP: 1.0000 [drp] | Freq: Every day | OPHTHALMIC | Status: DC

## 2024-05-31 MED ORDER — MIDAZOLAM HCL 2 MG/2ML IJ SOLN
INTRAMUSCULAR | Status: DC | PRN
  Administered 2024-05-31 (×2): 1 mg via INTRAVENOUS

## 2024-05-31 MED ORDER — ACETAMINOPHEN 325 MG PO TABS
650.0000 mg | ORAL_TABLET | Freq: Four times a day (QID) | ORAL | Status: AC | PRN
Start: 2024-05-31 — End: ?
  Administered 2024-06-01 – 2024-06-03 (×3): 650 mg via ORAL
  Filled 2024-05-31 (×5): qty 2

## 2024-05-31 MED ORDER — SODIUM CHLORIDE 0.9 % IV SOLN: 250.0000 mL | INTRAVENOUS | Status: DC | PRN

## 2024-05-31 MED ORDER — ASPIRIN 81 MG PO TBEC
81.0000 mg | DELAYED_RELEASE_TABLET | Freq: Every day | ORAL | Status: DC
  Administered 2024-06-01 – 2024-06-08 (×8): 81 mg via ORAL
  Filled 2024-05-31 (×7): qty 1

## 2024-05-31 MED ORDER — FENTANYL CITRATE (PF) 100 MCG/2ML IJ SOLN
INTRAMUSCULAR | Status: DC | PRN
  Administered 2024-05-31 (×4): 25 ug via INTRAVENOUS

## 2024-05-31 MED ORDER — METHYLPREDNISOLONE SODIUM SUCC 125 MG IJ SOLR
INTRAMUSCULAR | Status: AC
  Filled 2024-05-31: qty 2

## 2024-05-31 MED ORDER — SACUBITRIL-VALSARTAN 24-26 MG PO TABS
1.0000 | ORAL_TABLET | Freq: Two times a day (BID) | ORAL | Status: AC
  Administered 2024-05-31 (×4): 1 via ORAL
  Filled 2024-05-31 (×2): qty 1

## 2024-05-31 MED ORDER — DIPHENHYDRAMINE HCL 50 MG/ML IJ SOLN
INTRAMUSCULAR | Status: AC
  Filled 2024-05-31: qty 1

## 2024-05-31 MED ORDER — SACUBITRIL-VALSARTAN 49-51 MG PO TABS
1.0000 | ORAL_TABLET | Freq: Two times a day (BID) | ORAL | Status: DC
  Filled 2024-05-31: qty 1

## 2024-05-31 MED ORDER — IOHEXOL 350 MG/ML SOLN
INTRAVENOUS | Status: DC | PRN
  Administered 2024-05-31 (×2): 35 mL

## 2024-05-31 MED ORDER — HYDRALAZINE HCL 20 MG/ML IJ SOLN
10.0000 mg | INTRAMUSCULAR | Status: DC | PRN
Start: 2024-05-31 — End: 2024-05-31

## 2024-05-31 MED ORDER — SEVELAMER CARBONATE 800 MG PO TABS
1600.0000 mg | ORAL_TABLET | Freq: Three times a day (TID) | ORAL | Status: DC
  Administered 2024-05-31 – 2024-06-01 (×6): 1600 mg via ORAL
  Administered 2024-06-02 (×2): 2400 mg via ORAL
  Administered 2024-06-03 – 2024-06-04 (×3): 1600 mg via ORAL
  Filled 2024-05-31: qty 2
  Filled 2024-05-31 (×2): qty 3
  Filled 2024-05-31: qty 2
  Filled 2024-05-31 (×5): qty 3

## 2024-05-31 MED ORDER — DIPHENHYDRAMINE HCL 50 MG/ML IJ SOLN
INTRAMUSCULAR | Status: DC | PRN
  Administered 2024-05-31 (×4): 25 mg via INTRAVENOUS

## 2024-05-31 MED ORDER — SEVELAMER CARBONATE 800 MG PO TABS: 1600.0000 mg | ORAL_TABLET | Freq: Three times a day (TID) | ORAL | Status: DC

## 2024-05-31 MED ORDER — HEPARIN SODIUM (PORCINE) 5000 UNIT/ML IJ SOLN
5000.0000 [IU] | Freq: Three times a day (TID) | INTRAMUSCULAR | Status: DC
  Administered 2024-05-31 – 2024-06-02 (×10): 5000 [IU] via SUBCUTANEOUS
  Filled 2024-05-31 (×6): qty 1

## 2024-05-31 MED ORDER — HEPARIN (PORCINE) IN NACL 1000-0.9 UT/500ML-% IV SOLN
INTRAVENOUS | Status: DC | PRN
  Administered 2024-05-31 (×4): 500 mL

## 2024-05-31 MED ORDER — METHYLPREDNISOLONE SODIUM SUCC 125 MG IJ SOLR
INTRAMUSCULAR | Status: DC | PRN
  Administered 2024-05-31 (×2): 125 mg via INTRAVENOUS

## 2024-05-31 MED ORDER — CHLORHEXIDINE GLUCONATE CLOTH 2 % EX PADS: 6.0000 | MEDICATED_PAD | Freq: Every day | CUTANEOUS | Status: DC

## 2024-05-31 MED ORDER — VERAPAMIL HCL 2.5 MG/ML IV SOLN
INTRAVENOUS | Status: AC
  Filled 2024-05-31: qty 2

## 2024-05-31 MED ORDER — ACETAMINOPHEN 325 MG PO TABS
650.0000 mg | ORAL_TABLET | ORAL | Status: DC | PRN
Start: 2024-05-31 — End: 2024-05-31

## 2024-05-31 MED ORDER — SODIUM CHLORIDE 0.9% FLUSH
3.0000 mL | Freq: Two times a day (BID) | INTRAVENOUS | Status: DC
  Administered 2024-05-31 – 2024-06-07 (×17): 3 mL via INTRAVENOUS

## 2024-05-31 MED ORDER — HYDRALAZINE HCL 20 MG/ML IJ SOLN: 5.0000 mg | Freq: Three times a day (TID) | INTRAMUSCULAR | Status: DC | PRN

## 2024-05-31 MED ORDER — ACETAMINOPHEN 650 MG RE SUPP: 650.0000 mg | Freq: Four times a day (QID) | RECTAL | Status: DC | PRN

## 2024-05-31 MED ORDER — TRAZODONE HCL 50 MG PO TABS
150.0000 mg | ORAL_TABLET | Freq: Every day | ORAL | Status: DC
  Administered 2024-05-31 – 2024-06-07 (×10): 150 mg via ORAL
  Filled 2024-05-31 (×8): qty 1

## 2024-05-31 MED ORDER — ASPIRIN 81 MG PO CHEW: 81.0000 mg | CHEWABLE_TABLET | ORAL | Status: DC

## 2024-05-31 MED ORDER — SODIUM CHLORIDE 0.9% FLUSH: 3.0000 mL | Freq: Two times a day (BID) | INTRAVENOUS | Status: DC

## 2024-05-31 MED ORDER — ISOSORBIDE MONONITRATE ER 30 MG PO TB24: 30.0000 mg | ORAL_TABLET | Freq: Every day | ORAL | Status: DC

## 2024-05-31 MED ORDER — MIDAZOLAM HCL 2 MG/2ML IJ SOLN
INTRAMUSCULAR | Status: AC
  Filled 2024-05-31: qty 2

## 2024-05-31 MED ORDER — LOTEPREDNOL-TOBRAMYCIN 0.5-0.3 % OP SUSP: 1.0000 [drp] | Freq: Every day | OPHTHALMIC | Status: DC

## 2024-05-31 NOTE — Assessment & Plan Note (Signed)
 Continue ASA  Statin intolerant

## 2024-05-31 NOTE — Consult Note (Signed)
 [facility] Renal Consultation Note    Indication for Consultation:  Management of ESRD/hemodialysis; anemia, hypertension/volume and secondary hyperparathyroidism  HPI: [name] is a 58 y.o. male with a PMH significant for CAD, HFrEF, DM, HTN, PAD s/p left TMA, s/p right BKA, HLD, ICM (EF with recent drop to 30-35%), and ESRD on HD MWF at [facility] who was schedule for left and right heart cath due to worsening EF.  Results revealed multivessel CAD, low cardiac output, elevated filling pressures.  He needed to have cardiac clearance before new vascular access placement and will be hospitalized to help improve his volume status prior to the procedure.  We were consulted to provide dialysis during his hospitalization.  He had been complaining of exertional chest pain.  He also reports cramping while on HD but has been getting to or slightly below his edw.  Past Medical History:  Diagnosis Date   Acute osteomyelitis of right foot (HCC) 05/15/2019   Anemia 06/23/2019   Aortic stenosis    Bursitis    right shoulder   Complication of anesthesia    Coronary artery disease    Diabetes with renal manifestations(250.4) 08/23/2013   Diabetic infection of right foot (HCC)    Diabetic retinopathy associated with type 2 diabetes mellitus (HCC) 06/23/2019   ESRD on hemodialysis (HCC) 07/18/2011   Essential hypertension 10/18/2007   Qualifier: Diagnosis of  By: Kassie MD, Alyce A    History of blood transfusion    HYPERLIPIDEMIA 05/16/2008   Qualifier: Diagnosis of  By: Norleen MD, [name] ORN    MRSA INFECTION 10/18/2007   Qualifier: Diagnosis of  By: Wilhemina KRAFT, Lucy     Other forms of retinal detachment(361.89) 06/23/2019   PAD (peripheral artery disease) (HCC)    PONV (postoperative nausea and vomiting)    Prolonged QT interval    Pulmonary hypertension (HCC)    Secondary hyperparathyroidism (HCC) 06/23/2019   Sepsis (HCC) 05/14/2019   Past Surgical History:  Procedure Laterality Date    A/V FISTULAGRAM N/A 12/29/2023   Procedure: A/V Fistulagram;  Surgeon: Norine Manuelita LABOR, MD;  [facility]: [facility] INVASIVE CV LAB;  Service: Cardiovascular;  Laterality: N/A;   A/V SHUNT INTERVENTION N/A 03/17/2024   Procedure: A/V SHUNT INTERVENTION;  Surgeon: Gretta Lonni PARAS, MD;  Location: [facility] PV LAB;  Service: Cardiovascular;  Laterality: N/A;  cephalic arch   ABDOMINAL AORTOGRAM N/A 05/17/2019   Procedure: ABDOMINAL AORTOGRAM;  Surgeon: Harvey Carlin BRAVO, MD;  Location: [facility] INVASIVE CV LAB;  Service: Cardiovascular;  Laterality: N/A;   AMPUTATION TOE Right 05/14/2019   Procedure: PARTIAL AMPUTATION SECOND TOE RIGHT FOOT;  Surgeon: Gretel Ozell PARAS, DPM;  Location: [facility] OR;  Service: Podiatry;  Laterality: Right;   AMPUTATION TOE Right 05/23/2019   Procedure: AMPUTATION OF SECOND TOE METATARSAL PHALANGEAL JOINT;  Surgeon: Gretel Ozell PARAS, DPM;  Location: [facility] OR;  Service: Podiatry;  Laterality: Right;   AMPUTATION TOE Right 05/27/2019   Procedure: Amputation Toe;  Surgeon: Gretel Ozell PARAS, DPM;  Location: [facility] OR;  Service: Podiatry;  Laterality: Right;  right third toe   APPLICATION OF WOUND VAC Right 05/27/2019   Procedure: Application Of Wound Vac;  Surgeon: Gretel Ozell PARAS, DPM;  Location: [facility] OR;  Service: Podiatry;  Laterality: Right;   AV FISTULA PLACEMENT  06-18-11   Right brachiocephalic AVF   BONE BIOPSY Right 05/14/2019   Procedure: OPEN SUPERFICIAL BONE BIOPSY GREAT TOE;  Surgeon: Gretel Ozell PARAS, DPM;  Location: [facility] OR;  Service: Podiatry;  Laterality: Right;   CARDIAC CATHETERIZATION     CORONARY BALLOON ANGIOPLASTY N/A 09/24/2020   Procedure: CORONARY BALLOON ANGIOPLASTY;  Surgeon: Swaziland, Peter M, MD;  Location: [facility] INVASIVE CV LAB;  Service: Cardiovascular;  Laterality: N/A;   CORONARY PRESSURE/FFR STUDY N/A 09/24/2020   Procedure: INTRAVASCULAR PRESSURE WIRE/FFR STUDY;  Surgeon: Swaziland, Peter M, MD;  Location: [facility] INVASIVE CV LAB;  Service: Cardiovascular;  Laterality: N/A;   CORONARY  STENT INTERVENTION N/A 09/24/2020   Procedure: CORONARY STENT INTERVENTION;  Surgeon: Swaziland, Peter M, MD;  Location: [facility] INVASIVE CV LAB;  Service: Cardiovascular;  Laterality: N/A;   EYE SURGERY     left eye for Laser, diabetic retinopathy   FISTULOGRAM Right 11/06/2011   Procedure: FISTULOGRAM;  Surgeon: Redell LITTIE Door, MD;  Location: [facility] CATH LAB;  Service: Cardiovascular;  Laterality: Right;   HEMATOMA EVACUATION Right Feb. 25, 2014   HEMATOMA EVACUATION Right 12/14/2012   Procedure: EVACUATION HEMATOMA;  Surgeon: Redell LITTIE Door, MD;  Location: [facility] OR;  Service: Vascular;  Laterality: Right;   INCISION AND DRAINAGE Right 05/23/2019   Procedure: INCISION AND DRAINAGE OF RIGHT FOOT DEEP SPACE ABSCESS;  Surgeon: Gretel Ozell PARAS, DPM;  [facility]: [facility];  Service: Podiatry;  Laterality: Right;   INJECTION OF SILICONE OIL Left 01/09/2023   Procedure: INJECTION OF SILICONE OIL;  Surgeon: Elner Arley LABOR, MD;  Location: [facility] OR;  Service: Ophthalmology;  Laterality: Left;   INSERTION OF DIALYSIS CATHETER  12/14/2012   Procedure: INSERTION OF DIALYSIS CATHETER;  Surgeon: Redell LITTIE Door, MD;  Location: [facility] OR;  Service: Vascular;;   INSERTION OF DIALYSIS CATHETER Right 01/07/2021   Procedure: INSERTION OF DIALYSIS CATHETER;  Surgeon: Harvey Carlin BRAVO, MD;  Location: [facility] OR;  Service: Vascular;  Laterality: Right;   INSERTION OF DIALYSIS CATHETER Left 01/08/2021   Procedure: INSERTION OF LEFT INTERNAL JUGULAR DIALYSIS CATHETER, tunneled;  Surgeon: Eliza Lonni RAMAN, MD;  Location: [facility] OR;  Service: Vascular;  Laterality: Left;   IRRIGATION AND DEBRIDEMENT FOOT Right 05/25/2019   Procedure: INCISION AND DRAINAGE FOOT;  Surgeon: Gretel Ozell PARAS, DPM;  [facility];  Service: Podiatry;  Laterality: Right;   IRRIGATION AND DEBRIDEMENT FOOT Right 05/27/2019   Procedure: IRRIGATION AND DEBRIDEMENT FOOT;  Surgeon: Gretel Ozell PARAS, DPM;  [facility]: [facility] OR;  Service: Podiatry;  Laterality: Right;   LASER PHOTO ABLATION Left  01/09/2023   Procedure: LASER PHOTO ABLATION;  Surgeon: Elner Arley LABOR, MD;  Location: [facility] OR;  Service: Ophthalmology;  Laterality: Left;   LEFT HEART CATH AND CORONARY ANGIOGRAPHY N/A 09/24/2020   Procedure: LEFT HEART CATH AND CORONARY ANGIOGRAPHY;  Surgeon: Swaziland, Peter M, MD;  Location: [facility] INVASIVE CV LAB;  Service: Cardiovascular;  Laterality: N/A;   LOWER EXTREMITY ANGIOGRAPHY Bilateral 05/17/2019   Procedure: LOWER EXTREMITY ANGIOGRAPHY;  Surgeon: Harvey Carlin BRAVO, MD;  [facility] INVAS[facility] CV LAB;  Service: Cardiovascular;  Laterality: Bilateral;   PARS PLANA VITRECTOMY Left 01/09/2023   Procedure: PARS PLANA VITRECTOMY WITH 25 GAUGE AND REPAIR OF RETINAL DETACHMENT;  Surgeon: Elner Arley LABOR, MD;  Location: [facility] OR;  Service: Ophthalmology;  Laterality: Left;   REMOVAL OF A DIALYSIS CATHETER Right 01/08/2021   Procedure: REMOVAL OF TEMPORARY DIALYSIS CATHETER, LEFT FEMORAL VEIN;  Surgeon: Eliza Lonni RAMAN, MD;  Location: [facility] OR;  Service: Vascular;  Laterality: Right;   REVISON OF ARTERIOVENOUS FISTULA Right 12/14/2012   Procedure: REVISON OF upper arm ARTERIOVENOUS FISTULA using 32mmx10cm gortex graft;  Surgeon: Redell LITTIE Door, MD;  Location:  [facility] OR;  Service: Vascular;  Laterality: Right;   REVISON OF ARTERIOVENOUS FISTULA Right 12/27/2020   Procedure: RIGHT UPPER EXTREMITY ARTERIOVENOUS FISTULA REVISiON;  Surgeon: Magda Debby SAILOR, MD;  Location: [facility] OR;  Service: Vascular;  Laterality: Right;  PERIPHERAL NERVE BLOCK   RIGHT/LEFT HEART CATH AND CORONARY ANGIOGRAPHY N/A 08/07/2020   Procedure: RIGHT/LEFT HEART CATH AND CORONARY ANGIOGRAPHY;  Surgeon: Swaziland, Peter M, MD;  Location: [facility] INVASIVE CV LAB;  Service: Cardiovascular;  Laterality: N/A;   SOFT TISSUE MASS EXCISION     Right arm, Left leg  for MRSA infection   VENOUS ANGIOPLASTY  12/29/2023   Procedure: VENOUS ANGIOPLASTY;  Surgeon: Norine Manuelita LABOR, MD;  Location: [facility] INVAS[facility] CV LAB;  Service: Cardiovascular;;  Cephalic Arch;  Intragraft; Distal Edge of Stent   VENOUS ANGIOPLASTY  03/17/2024   Procedure: VENOUS ANGIOPLASTY;  Surgeon: Gretta Lonni PARAS, MD;  Location: [facility] PV LAB;  Service: Cardiovascular;;   Family History:   Family History  Problem Relation Age of Onset   Diabetes Mother    Social History:  reports that he has never smoked. He has been exposed to tobacco smoke. He has never used smokeless tobacco. He reports that he does not drink alcohol  and does not use drugs. Allergies  Allergen Reactions   Crestor [Rosuvastatin]     Other reaction(s): Muscle Pain   Fentanyl  Itching    Patient received Fentanyl  during procedure and had reaction, medicinal intervention applied.    Lipitor [Atorvastatin ]     Other reaction(s): Muscle Pain   Amoxicillin Itching    Did it involve swelling of the face/tongue/throat, SOB, or low BP? Unknown Did it involve sudden or severe rash/hives, skin peeling, or any reaction on the inside of your mouth or nose? Unknown Did you need to seek medical attention at a hospital or doctor's office? Unknown When did it last happen?      20 years ago If all above answers are NO, may proceed with cephalosporin use.    Cefazolin  Other (See Comments) and Swelling    LIP SWELLING   Chlorhexidine  Hives    Patient has blistering   Hydromorphone  Itching    Patient states he may take with benadryl . Makes feet itch.   Oxycodone  Rash and Other (See Comments)   Percocet [Oxycodone -Acetaminophen ] Itching and Other (See Comments)    Legs only. Itching and burning feeling. Can tolerate with hydroxyzine     Morphine      Other reaction(s): rash/itching All narcotics per pt   Gabapentin Nausea And Vomiting    Stomach issues    Prior to Admission medications   Medication Sig Start Date End Date Taking? Authorizing Provider  aspirin  EC 81 MG tablet Take 1 tablet (81 mg total) by mouth daily. 06/28/19  Yes Darron Deatrice LABOR, MD  EPINEPHrine  (EPIPEN  2-PAK) 0.3 mg/0.3 mL IJ SOAJ injection  Inject 0.3 mg into the muscle as needed for anaphylaxis.   Yes [provider]  hydrOXYzine  (VISTARIL ) 50 MG capsule Take 50 mg by mouth every 6 (six) hours as needed for itching. 05/26/24  Yes [provider]  isosorbide  mononitrate (IMDUR ) 30 MG 24 hr tablet Take 30 mg by mouth daily. 03/29/24  Yes [provider]  metoprolol  succinate (TOPROL  XL) 100 MG 24 hr tablet Take 1 tablet (100 mg total) by mouth daily. Take with or immediately following a meal. 03/30/24  Yes Patwardhan, Manish J, MD  multivitamin (RENA-VIT) TABS tablet Take 1 tablet by mouth daily with lunch.   Yes  [provider]  nitroGLYCERIN  (NITROSTAT ) 0.4 MG SL tablet Place 0.4 mg under the tongue every 5 (five) minutes as needed for chest pain.   Yes [provider]  ondansetron  (ZOFRAN -ODT) 8 MG disintegrating tablet Take 8 mg by mouth 3 (three) times daily. 05/26/24  Yes [provider]  pantoprazole  (PROTONIX ) 40 MG tablet Take 40 mg by mouth daily.   Yes [provider]  prednisoLONE  acetate (PRED FORTE ) 1 % ophthalmic suspension Place 1 drop into the right eye daily. 12/08/23  Yes [provider]  sevelamer  carbonate (RENVELA ) 800 MG tablet Take 1,600-2,400 mg by mouth 3 (three) times daily with meals. 07/25/20  Yes [provider]  sodium zirconium cyclosilicate  (LOKELMA ) 10 g PACK packet Take 10 g by mouth every Tuesday, Thursday, Saturday, and Sunday.   Yes [provider]  Tenapanor HCl, CKD, (XPHOZAH) 30 MG TABS Take 30 mg by mouth daily as needed (when directed). 04/08/23  Yes [provider]  traZODone  (DESYREL ) 150 MG tablet Take 150 mg by mouth at bedtime. 07/15/21  Yes [provider]  triamcinolone cream (KENALOG) 0.1 % Apply 1 Application topically 2 (two) times daily as needed (ITCHING).   Yes [provider]  ZYLET  0.5-0.3 % SUSP Place 1 drop into the right eye daily. 03/23/24  Yes [provider]   Continuous Blood Gluc Sensor (FREESTYLE LIBRE 14 DAY SENSOR) MISC See admin instructions. 06/20/19   [provider]  sacubitril -valsartan  (ENTRESTO ) 24-26 MG Take 1 tablet by mouth 2 (two) times daily. START AFTER HEART CATH 05/26/24   Patwardhan, Newman PARAS, MD  loratadine  (CLARITIN ) 10 MG tablet Take 10 mg by mouth daily as needed for allergies.  11/06/19  [provider]   Current Facility-Administered Medications  Medication Dose Route Frequency Provider Last Rate Last Admin   0.9 %  sodium chloride  infusion  250 mL Intravenous PRN Patwardhan, Manish J, MD       aspirin  chewable tablet 81 mg  81 mg Oral Pre-Cath Patwardhan, Manish J, MD       diphenhydrAMINE  (BENADRYL ) injection    PRN Patwardhan, Manish J, MD   25 mg at 05/31/24 1332   famotidine  (PEPCID ) 20-0.9 MG/50ML-% IVPB            famotidine  (PEPCID ) IVPB 20 mg premix    Continuous PRN Patwardhan, Manish J, MD 200 mL/hr at 05/31/24 1340 20 mg at 05/31/24 1340   fentaNYL  (SUBLIMAZE ) injection    PRN Patwardhan, Manish J, MD   25 mcg at 05/31/24 1326   Heparin  (Porcine) in NaCl 1000-0.9 UT/500ML-% SOLN    PRN Patwardhan, Manish J, MD   500 mL at 05/31/24 1306   insulin  aspart (novoLOG ) injection 0-6 Units  0-6 Units Subcutaneous TID WC Waddell Rake, MD       iohexol  (OMNIPAQUE ) 350 MG/ML injection    PRN Patwardhan, Newman PARAS, MD   35 mL at 05/31/24 1417   isosorbide  mononitrate (IMDUR ) 24 hr tablet 30 mg  30 mg Oral Daily Clegg, Amy D, NP       methylPREDNISolone  sodium succinate (SOLU-MEDROL ) 125 mg/2 mL injection    PRN Patwardhan, Manish J, MD   125 mg at 05/31/24 1337   midazolam  (VERSED ) injection    PRN Patwardhan, Manish J, MD   1 mg at 05/31/24 1321   sacubitril -valsartan  (ENTRESTO ) 24-26 mg per tablet  1 tablet Oral BID Clegg, Amy D, NP       sodium chloride  flush (NS) 0.9 %  injection 3 mL  3 mL Intravenous Q12H Patwardhan, Manish J, MD       sodium chloride  flush (NS) 0.9 % injection 3 mL  3 mL Intravenous PRN  Patwardhan, Newman PARAS, MD       Labs: Basic Metabolic Panel: Recent Labs  Lab 05/26/24 1246  NA 137  K 5.3*  CL 93*  CO2 19*  GLUCOSE 134*  BUN 50*  CREATININE 9.68*  CALCIUM  9.8   Liver Function Tests: No results for input(s): AST, ALT, ALKPHOS, BILITOT, PROT, ALBUMIN in the last 168 hours. No results for input(s): LIPASE, AMYLASE in the last 168 hours. No results for input(s): AMMONIA in the last 168 hours. CBC: Recent Labs  Lab 05/26/24 1246  WBC 3.8  HGB 10.9*  HCT 34.0*  MCV 87  PLT 107*   Cardiac Enzymes: No results for input(s): CKTOTAL, CKMB, CKMBINDEX, TROPONINI in the last 168 hours. CBG: No results for input(s): GLUCAP in the last 168 hours. Iron  Studies: No results for input(s): IRON , TIBC, TRANSFERRIN, FERRITIN in the last 72 hours. Studies/Results: CARDIAC CATHETERIZATION Result Date: 05/31/2024 Images from the original result were not included. Coronary angiography 05/31/2024: LM: No significant disease LAD: Occluded mid LAD stent        Faint collaterals filling distal LAD        Diag 2 with 80% stenosis Ramus: Small caliber vessel, prox 90% stenosis Lcx: Prox 70%, mid 80% stenoses. Occluded OM 3 stent RCA: Large, dominant vessel          Mid focal 60% stenosis         LVEDP 22 mmHg Right heart catheterization 05/31/2024: RA: 22 mmHg RV: 105/10 mmHg PA: 109/56 mmHg, mPAP 81 mmHg PCW: 35 mmHg AO sats: 96% PA sats: 41% Fick: CO: 3.1 L/min CI: 1.6 L/min/m2 Thermodilution: CO: 3.0 L/min CI: 1.5 L/min/m2 Conclusion: Severe multivessel CAD with occluded prior stents in the LAD and left circumflex Territory supplied by these vessels is largely infarcted based on stress test findings.  Therefore, I do not think he will benefit from revascularization to these vessels. 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 Recommend medical  admission, with heart failure team input (Dr. Zenaida already aware) Newman PARAS Lawrence, MD    ROS: Pertinent items are noted in HPI. Physical Exam: Vitals:   05/31/24 1407 05/31/24 1412 05/31/24 1417 05/31/24 1450  BP: (!) 152/105 (!) 158/101 (!) 158/101   Pulse: 82 74 (!) 0 66  Resp: (!) 24 15  (!) 23  Temp:      SpO2: 94% 95%  94%  Weight:      Height:          Weight change:  No intake or output data in the 24 hours ending 05/31/24 1621 BP (!) 158/101   Pulse 66   Temp 98.6 F (37 C)   Resp (!) 23   Ht 5' 10 (1.778 m)   Wt 74.8 kg   SpO2 94%   BMI 23.68 kg/m  General appearance: alert, cooperative, and no distress Head: Normocephalic, without obvious abnormality, atraumatic Resp: clear to auscultation bilaterally Cardio: RRR, III/VI SEM GI: soft, non-tender; bowel sounds normal; no masses,  no organomegaly Extremities: no edema, s/p LBKA, s/p right TMA, RUE AVF +T/B Dialysis Access:  Dialysis Orders: Center: [facility]   on MWF . EDW 73 kg HD Bath 1K/2.5Ca  Time 4:00 Heparin  none. Access RUE AVF BFR 400  DFR 800    Hectoral 3 mcg IV/HD  Assessment/Plan:  Acute on chronic HFrEF, ICM - plan is to start entresto  and increase fluid removal.  We discussed an extra HD session today, however he declined.  No BB due to low output.  Vascular access - has some instent stenosis of brachial arch stent and was to have left AVG and TDC placement by Dr. Gretta, however this was postponed due to his cardiac issues.  He will hopefully get the procedure while he is an inpatient after more aggressive volume removal   ESRD -  plan for HD tomorrow to keep on his outpatient schedule.  He declined an extra treatment today.  Hypertension/volume  - as above, will need to challenge edw, however his UF is limited by cramping and drops in bp.  Anemia  - stable  Metabolic bone disease -   continue with home meds  Nutrition - renal diet carb modified.  Fairy RONAL Sellar, MD [facility], [facility]  05/31/2024, 4:21 PM

## 2024-05-31 NOTE — Assessment & Plan Note (Addendum)
-  LHC today: Severe multivessel CAD with occluded prior stents in the LAD and left circumflex Territory supplied by these vessels is largely infarcted based on stress test findings.  Therefore, I do not think he will benefit from revascularization to these vessels. -continue medical management  -statin intolerant -LpA pending for AM  -per cardiology

## 2024-05-31 NOTE — Assessment & Plan Note (Signed)
 Severe pulmonary HTN, likely WHO group 2, per LHC today

## 2024-05-31 NOTE — Plan of Care (Signed)
  Problem: Education: Goal: Understanding of CV disease, CV risk reduction, and recovery process will improve Outcome: Progressing   Problem: Activity: Goal: Ability to return to baseline activity level will improve Outcome: Progressing   Problem: Cardiovascular: Goal: Ability to achieve and maintain adequate cardiovascular perfusion will improve Outcome: Progressing Goal: Vascular access site(s) Level 0-1 will be maintained Outcome: Progressing   Problem: Health Behavior/Discharge Planning: Goal: Ability to safely manage health-related needs after discharge will improve Outcome: Progressing   Problem: Education: Goal: Knowledge of General Education information will improve Description: Including pain rating scale, medication(s)/side effects and non-pharmacologic comfort measures Outcome: Progressing   Problem: Health Behavior/Discharge Planning: Goal: Ability to manage health-related needs will improve Outcome: Progressing   Problem: Clinical Measurements: Goal: Ability to maintain clinical measurements within normal limits will improve Outcome: Progressing Goal: Will remain free from infection Outcome: Progressing Goal: Diagnostic test results will improve Outcome: Progressing Goal: Respiratory complications will improve Outcome: Progressing Goal: Cardiovascular complication will be avoided Outcome: Progressing   Problem: Activity: Goal: Risk for activity intolerance will decrease Outcome: Progressing   Problem: Nutrition: Goal: Adequate nutrition will be maintained Outcome: Progressing   Problem: Elimination: Goal: Will not experience complications related to bowel motility Outcome: Progressing Goal: Will not experience complications related to urinary retention Outcome: Progressing   Problem: Pain Managment: Goal: General experience of comfort will improve and/or be controlled Outcome: Progressing   Problem: Safety: Goal: Ability to remain free from  injury will improve Outcome: Progressing   Problem: Skin Integrity: Goal: Risk for impaired skin integrity will decrease Outcome: Progressing   Problem: Education: Goal: Ability to describe self-care measures that may prevent or decrease complications (Diabetes Survival Skills Education) will improve Outcome: Progressing   Problem: Coping: Goal: Ability to adjust to condition or change in health will improve Outcome: Progressing   Problem: Fluid Volume: Goal: Ability to maintain a balanced intake and output will improve Outcome: Progressing   Problem: Health Behavior/Discharge Planning: Goal: Ability to identify and utilize available resources and services will improve Outcome: Progressing Goal: Ability to manage health-related needs will improve Outcome: Progressing   Problem: Metabolic: Goal: Ability to maintain appropriate glucose levels will improve Outcome: Progressing   Problem: Nutritional: Goal: Maintenance of adequate nutrition will improve Outcome: Progressing Goal: Progress toward achieving an optimal weight will improve Outcome: Progressing   Problem: Skin Integrity: Goal: Risk for impaired skin integrity will decrease Outcome: Progressing   Problem: Tissue Perfusion: Goal: Adequacy of tissue perfusion will improve Outcome: Progressing

## 2024-05-31 NOTE — Interval H&P Note (Signed)
 History and Physical Interval Note:  05/31/2024 1:17 PM  [name]  has presented today for surgery, with the diagnosis of heart failure.  The various methods of treatment have been discussed with the patient and family. After consideration of risks, benefits and other options for treatment, the patient has consented to  Procedure(s): RIGHT/LEFT HEART CATH AND CORONARY ANGIOGRAPHY (N/A) as a surgical intervention.  The patient's history has been reviewed, patient examined, no change in status, stable for surgery.  I have reviewed the patient's chart and labs.  Questions were answered to the patient's satisfaction.     Joleene Burnham J [name]

## 2024-05-31 NOTE — H&P (Signed)
 OV 05/26/2024 copied for documentation   Cardiology Office Note:  .   Date:  05/31/2024  ID:  [name], DOB Feb 02, 1966, MRN [id number] PCP: Shelda Atlas, MD  [facility] Providers Cardiologist:  Newman Lawrence, MD PCP: Shelda Atlas, MD  No chief complaint on file.    [name] is a 58 y.o. male with hypertension, hyperlipidemia, diabetes, CAD, HFrEF, PAD  s/p left BKA & right transmetatarsal potation, ESRD on HD  History of Present Illness  Patient has known severe multivessel CAD with prior PCI and ISR in LAD.  He was hospitalized at [facility] in 2022.  There were no good targets for revascularization as per discussion then.  He was then seen at [facility], last in 04/2022.  It was unclear if bypass surgery option was discussed with Dr. Hershal at [facility],.  Patient is here today with his wife.  Patient is scheduled to undergo fistula revision surgery on Monday, 05/30/2024 by Dr. Gretta.  He is also being to undergo bilateral inguinal hernia surgery in the near future.  He has exertional dyspnea symptoms, denies any edema.  Recent echocardiogram shows severely reduced LVEF, moderate to severe TR, mild to moderate MR, with stress test largely showing infarct without any significant ischemia.  He has had recent lower chest pai, unrleated to exertion, lasting for an hour so.     Vitals:   05/31/24 1044 05/31/24 1308  BP: (!) 145/95   Pulse: 63   Resp: 16   Temp: 98.6 F (37 C)   SpO2: 100% 100%       Review of Systems  Cardiovascular:  Positive for dyspnea on exertion. Negative for chest pain, leg swelling, palpitations and syncope.        Studies Reviewed: SABRA        EKG 03/30/2024: Normal sinus rhythm Left ventricular hypertrophy with repolarization abnormality ( Cornell product ) When compared with ECG of 09-Jan-2023 13:06, Questionable change in initial forces of Lateral leads    Echocardiogram 04/2024:  1. Left ventricular ejection fraction,  by estimation, is 30 to 35%. Left  ventricular ejection fraction by 3D volume is 30 %. The left ventricle has  moderately decreased function. The left ventricle demonstrates global  hypokinesis. Left ventricular diastolic function could not be evaluated. The average left ventricular global longitudinal strain is -11.2 %.  The global longitudinal strain is abnormal.   2. Right ventricular systolic function is normal. The right ventricular  size is mildly enlarged.   3. Left atrial size was moderately dilated.   4. A small pericardial effusion is present. The pericardial effusion is  circumferential.   5. The mitral valve is normal in structure. Mild to moderate mitral valve  regurgitation. No evidence of mitral stenosis. Moderate to severe mitral  annular calcification.   6. Tricuspid valve regurgitation is severe.   7. The aortic valve is calcified. There is moderate calcification of the  aortic valve. There is moderate thickening of the aortic valve. Aortic  valve regurgitation is not visualized. Low flow low gradient moderate to  severe. Aortic valve area, by VTI measures 0.96 cm. Aortic valve  mean gradient measures 9.8 mmHg. Aortic valve Vmax measures 2.14 m/s, SVI 22, DI 0.34, Indexed AVA 0.51 cm2/m2.   8. The inferior vena cava is normal in size with greater than 50%  respiratory variability, suggesting right atrial pressure of 3 mmHg.   Stress test 04/2024:   Findings are consistent with infarction with peri-infarct ischemia. The  study is high risk.   No ST deviation was noted. The ECG was not diagnostic due to pharmacologic protocol.   LV perfusion is abnormal. There is no evidence of ischemia. There is evidence of infarction. Defect 1: There is a large defect with severe reduction in uptake present in the apical to basal inferior, inferolateral and apex location(s) that is fixed. There is abnormal wall motion in the defect area. Consistent with infarction and peri-infarct  ischemia.   Left ventricular function is abnormal. Global function is moderately reduced. There was a single regional abnormality. Nuclear stress EF: 32%. The left ventricular ejection fraction is moderately decreased (30-44%). End diastolic cavity size is severely enlarged. End systolic cavity size is severely enlarged.   Prior study available for comparison from 09/09/2013.   ECG rhythm shows normal sinus rhythm.   Large size, severe severity fixed basal to apical inferior, inferolateral and apical perfusion defect, suggestive of LCX territory scar without significant peri-infarct ischemia. LVEF 32% with inferolateral and apical akinesis. This is high risk study (due to LVEF <35% and large territory of scar). Compared to a prior study in 2014, the LVEF is lower and new regional perfusion defects are noted. This correlates with inferior and inferolateral akinesis on recent echo (my read).     Labs 04/2023: Chol 156, TG 114, HDL 78, LDL 58 HbA1C 6.6% Hb 10.6 Cr 10.4 TSH 3.5  Coronary angiogram 08/2021: 1. .  The proximal LAD has diffuse mild disease.  Within the mid LAD there is a long segment of stent with modest 30 to 40% in-stent restenosis.  The apical LAD is subtotally occluded.  It is a very small vessel in the segment.  There is a very small first   diagonal branch which is also subtotally occluded.  A medium sized second diagonal branch has a stent in its ostial segment with approximately 80% in-stent restenosis.  There is an inferior branch of this diagonal branch which is occluded and fills via  left to left collaterals.  There is a small ramus branch which has diffuse 90% disease as well as a small OM1 branch of the left circumflex which has diffuse 90% disease.  Within the mid circumflex there is mild to moderate disease and severe disease in  the distal circumflex which is also a small vessel.  The RCA has nonobstructive disease with a widely patent stent in the PL branch.  2.   Severely elevated LVEDP  3.  Severe systemic hypertension, patient was given multiple doses of medications including labetalol  and hydralazine  to address the blood pressure  4.  Despite premedication for possible allergy, the patient developed severe pruritus of his lower legs, treated with additional Benadryl  and steroids.  No other signs of any systemic allergic reaction were present.  5.  Moderate sedation with IV Benadryl , fentanyl , and Versed  for greater than 20 minutes was personally supervised by me with no sedation issues     Recommendations:   1. Left femoral arterial sheath will be removed when blood pressure has improved  2.  No good targets for PCI identified.  Would recommend continued aggressive risk factor modification medical therapy     Physical Exam Vitals and nursing note reviewed.  Constitutional:      General: He is not in acute distress. Neck:     Vascular: No JVD.  Cardiovascular:     Rate and Rhythm: Normal rate and regular rhythm.     Heart sounds: Normal heart sounds. No murmur heard. Pulmonary:  Effort: Pulmonary effort is normal.     Breath sounds: Normal breath sounds. No wheezing or rales.  Musculoskeletal:     Right lower leg: Edema (Trace) present.     Comments: Left BKA, right transmetatarsal amputation      VISIT DIAGNOSES: No diagnosis found.     [name] is a 58 y.o. male with hypertension, hyperlipidemia, diabetes, CAD, HFrEF, PAD  s/p left BKA & right transmetatarsal potation, ESRD on HD Assessment & Plan  HFrEF: New diagnosis.  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  Valvular regurgitation likely due to cardiomyopathy. With upcoming surgeries, I would ideally recommend right and left heart catheterization and assessing baseline hemodynamics.  He will need GDMT for HFrEF, will start with Entresto  24-26 mg twice daily after his cardiac catheterization.  Will arrange  follow-up BMP a week after that and follow-up with Pharm.D. for GDMT up titration. Continue metoprolol  succinate 50 mg daily. I will request Dr. Gretta to reschedule his fistula surgery after his heart catheterization, if that is medically reasonable.  CAD:  Known extensive CAD with no options for revascularization as of 2022. Recent chest pain less likely to be angina.  I repeat cardiac catheterization more from heart failure or invasive hemodynamic assessment standpoint.  I do not think any further intervention is likely to benefit him.  Continue aspirin  81 mg daily. Patient has appointment with lipid clinic soon to restart on Repatha. Continue metoprolol  succinate 100 mg daily. DM management as per PCP, currently not on on any therapy, but controlled diabetes.  PAD: Continue Aspirin  See above regarding lipid lowering therapy.   Aortic stenosis: While low-flow gradient moderate to severe aortic stenosis cannot be ruled out, it is quite unlikely that aortic valve is the etiology of his cardiomyopathy.  I anticipate aortic valve area may improve if his EF improves with GDMT for HFrEF. Will repeat echocardiogram in 6 months. (Will order at next visit).    Informed Consent   Shared Decision Making/Informed Consent The risks [stroke (1 in 1000), death (1 in 1000), kidney failure [usually temporary] (1 in 500), bleeding (1 in 200), allergic reaction [possibly serious] (1 in 200)], benefits (diagnostic support and management of coronary artery disease) and alternatives of a cardiac catheterization were discussed in detail with Mr. Ionescu and he is willing to proceed.      Meds ordered this encounter  Medications   aspirin  chewable tablet 81 mg   sodium chloride  flush (NS) 0.9 % injection 3 mL   sodium chloride  flush (NS) 0.9 % injection 3 mL   0.9 %  sodium chloride  infusion   Heparin  (Porcine) in NaCl 1000-0.9 UT/500ML-% SOLN     F/u in 3 months   Signed, [name], MD

## 2024-05-31 NOTE — Assessment & Plan Note (Signed)
 Statin intolerant

## 2024-05-31 NOTE — Assessment & Plan Note (Signed)
 Cbc pending  Baseline hgb around 10

## 2024-05-31 NOTE — Assessment & Plan Note (Signed)
 No recent A1C, pending  Very sensitive SSI and accuchecks QAC/HS

## 2024-05-31 NOTE — Telephone Encounter (Signed)
Patient Product/process development scientist completed.    The patient is insured through [facility]. Patient has [employer], may use a copay card, and/or apply for patient assistance if available.    Ran test claim for Entresto 24-26 mg and the current 30 day co-pay is $0.00.  Ran test claim for Farxiga 10 mg and the current 30 day co-pay is $0.00.  Ran test claim for Jardiance 10 mg and the current 30 day co-pay is $0.00.   This test claim was processed through [facility]- copay amounts may vary at other pharmacies due to pharmacy/plan contracts, or as the patient moves through the different stages of their insurance plan.     Roland Earl, CPHT Pharmacy Technician III Certified Patient Advocate [name] Patient Advocate Team Direct Number: [phone]  Fax: [phone]

## 2024-05-31 NOTE — Consult Note (Addendum)
 Advanced Heart Failure Team Consult Note   Primary Physician: Shelda Atlas, MD Cardiologist:  [name], MD Reason for Consultation: Heart Failure   HPI:    [name] is seen today for evaluation of heart failure  at the request of Dr Ritchie.   [name] is a 13 with a history of HFrEF, ICM, HTN, HLD, DM, CAD, PAD, s/p left BKA transmetatarsal, ESRD 3 times a week. 2021 PCI to D2, PCI to mid LAD, and PCI to rPDA with drug eluting stents. 2022 in-stent stenosis s/p PCI to the LAD. Of note he has statin intolerance (rosuvastatin, atorvastatin , and zetia ).   Previously seen by Cardiology at [facility] In 2023 EF 40% and normal RV.   Saw Dr [name] 8/7 or pre op clearance for upcoming surgeries. He reported chest pain and exertional dyspnea.  Recent echocardiogram shows severely reduced LVEF 30-35%  moderate to severe TR, mild to moderate [name], with stress test largely showing infarct without any significant ischemia. He was set up for cath  Presented today for scheduled cath given worsening Echo. Elevated filling pressures and low cardiac output.   RHC/LHC Multivessel disease.  RA 22 PA 109/56 PCWP 35 SVR 2500  PA Sat 40%  CO 3 CI 1.7   Home Medications Prior to Admission medications   Medication Sig Start Date End Date Taking? Authorizing Provider  aspirin  EC 81 MG tablet Take 1 tablet (81 mg total) by mouth daily. 06/28/19  Yes Darron Deatrice LABOR, MD  EPINEPHrine  (EPIPEN  2-PAK) 0.3 mg/0.3 mL IJ SOAJ injection Inject 0.3 mg into the muscle as needed for anaphylaxis.   Yes [provider]  hydrOXYzine  (VISTARIL ) 50 MG capsule Take 50 mg by mouth every 6 (six) hours as needed for itching. 05/26/24  Yes [provider]  isosorbide  mononitrate (IMDUR ) 30 MG 24 hr tablet Take 30 mg by mouth daily. 03/29/24  Yes [provider]  metoprolol  succinate (TOPROL  XL) 100 MG 24 hr tablet Take 1 tablet (100 mg total) by mouth daily. Take with or  immediately following a meal. 03/30/24  Yes Patwardhan, Manish J, MD  multivitamin (RENA-VIT) TABS tablet Take 1 tablet by mouth daily with lunch.   Yes [provider]  nitroGLYCERIN  (NITROSTAT ) 0.4 MG SL tablet Place 0.4 mg under the tongue every 5 (five) minutes as needed for chest pain.   Yes [provider]  ondansetron  (ZOFRAN -ODT) 8 MG disintegrating tablet Take 8 mg by mouth 3 (three) times daily. 05/26/24  Yes [provider]  pantoprazole  (PROTONIX ) 40 MG tablet Take 40 mg by mouth daily.   Yes [provider]  prednisoLONE  acetate (PRED FORTE ) 1 % ophthalmic suspension Place 1 drop into the right eye daily. 12/08/23  Yes [provider]  sevelamer  carbonate (RENVELA ) 800 MG tablet Take 1,600-2,400 mg by mouth 3 (three) times daily with meals. 07/25/20  Yes [provider]  sodium zirconium cyclosilicate  (LOKELMA ) 10 g PACK packet Take 10 g by mouth every Tuesday, Thursday, Saturday, and Sunday.   Yes [provider]  Tenapanor HCl, CKD, (XPHOZAH) 30 MG TABS Take 30 mg by mouth daily as needed (when directed). 04/08/23  Yes [provider]  traZODone  (DESYREL ) 150 MG tablet Take 150 mg by mouth at bedtime. 07/15/21  Yes [provider]  triamcinolone cream (KENALOG) 0.1 % Apply 1 Application topically 2 (two) times daily as needed (ITCHING).   Yes [provider]  ZYLET  0.5-0.3 % SUSP Place 1 drop into  the right eye daily. 03/23/24  Yes [provider]  Continuous Blood Gluc Sensor (FREESTYLE LIBRE 14 DAY SENSOR) MISC See admin instructions. 06/20/19   [provider]  sacubitril -valsartan  (ENTRESTO ) 24-26 MG Take 1 tablet by mouth 2 (two) times daily. START AFTER HEART CATH 05/26/24   Patwardhan, Newman PARAS, MD  loratadine  (CLARITIN ) 10 MG tablet Take 10 mg by mouth daily as needed for allergies.  11/06/19  [provider]    Past Medical History: Past Medical History:  Diagnosis Date    Acute osteomyelitis of right foot (HCC) 05/15/2019   Anemia 06/23/2019   Aortic stenosis    Bursitis    right shoulder   Complication of anesthesia    Coronary artery disease    Diabetes with renal manifestations(250.4) 08/23/2013   Diabetic infection of right foot (HCC)    Diabetic retinopathy associated with type 2 diabetes mellitus (HCC) 06/23/2019   ESRD on hemodialysis (HCC) 07/18/2011   Essential hypertension 10/18/2007   Qualifier: Diagnosis of  By: Kassie MD, Alyce A    History of blood transfusion    HYPERLIPIDEMIA 05/16/2008   Qualifier: Diagnosis of  By: Norleen MD, [name] ORN    MRSA INFECTION 10/18/2007   Qualifier: Diagnosis of  By: Wilhemina KRAFT, Lucy     Other forms of retinal detachment(361.89) 06/23/2019   PAD (peripheral artery disease) (HCC)    PONV (postoperative nausea and vomiting)    Prolonged QT interval    Pulmonary hypertension (HCC)    Secondary hyperparathyroidism (HCC) 06/23/2019   Sepsis (HCC) 05/14/2019    Past Surgical History: Past Surgical History:  Procedure Laterality Date   A/V FISTULAGRAM N/A 12/29/2023   Procedure: A/V Fistulagram;  Surgeon: Norine Manuelita LABOR, MD;  Location: [facility] INVASIVE CV LAB;  Service: Cardiovascular;  Laterality: N/A;   A/V SHUNT INTERVENTION N/A 03/17/2024   Procedure: A/V SHUNT INTERVENTION;  Surgeon: Gretta Lonni PARAS, MD;  Location: [facility] PV LAB;  Service: Cardiovascular;  Laterality: N/A;  cephalic arch   ABDOMINAL AORTOGRAM N/A 05/17/2019   Procedure: ABDOMINAL AORTOGRAM;  Surgeon: Harvey Carlin BRAVO, MD;  Location: [facility] INVASIVE CV LAB;  Service: Cardiovascular;  Laterality: N/A;   AMPUTATION TOE Right 05/14/2019   Procedure: PARTIAL AMPUTATION SECOND TOE RIGHT FOOT;  Surgeon: Gretel Ozell PARAS, DPM;  Location: [facility] OR;  Service: Podiatry;  Laterality: Right;   AMPUTATION TOE Right 05/23/2019   Procedure: AMPUTATION OF SECOND TOE METATARSAL PHALANGEAL JOINT;  Surgeon: Gretel Ozell PARAS, DPM;  Location: [facility] OR;  Service: Podiatry;   Laterality: Right;   AMPUTATION TOE Right 05/27/2019   Procedure: Amputation Toe;  Surgeon: Gretel Ozell PARAS, DPM;  Location: [facility] OR;  Service: Podiatry;  Laterality: Right;  right third toe   APPLICATION OF WOUND VAC Right 05/27/2019   Procedure: Application Of Wound Vac;  Surgeon: Gretel Ozell PARAS, DPM;  Location: [facility] OR;  Service: Podiatry;  Laterality: Right;   AV FISTULA PLACEMENT  06-18-11   Right brachiocephalic AVF   BONE BIOPSY Right 05/14/2019   Procedure: OPEN SUPERFICIAL BONE BIOPSY GREAT TOE;  Surgeon: Gretel Ozell PARAS, DPM;  Location: [facility] OR;  Service: Podiatry;  Laterality: Right;   CARDIAC CATHETERIZATION     CORONARY BALLOON ANGIOPLASTY N/A 09/24/2020   Procedure: CORONARY BALLOON ANGIOPLASTY;  Surgeon: Swaziland, Peter M, MD;  Location: [facility] INVASIVE CV LAB;  Service: Cardiovascular;  Laterality: N/A;   CORONARY PRESSURE/FFR STUDY N/A 09/24/2020   Procedure: INTRAVASCULAR PRESSURE WIRE/FFR STUDY;  Surgeon: Swaziland, Peter M, MD;  Location: [facility]  INVASIVE CV LAB;  Service: Cardiovascular;  Laterality: N/A;   CORONARY STENT INTERVENTION N/A 09/24/2020   Procedure: CORONARY STENT INTERVENTION;  Surgeon: Swaziland, Peter M, MD;  Location: [facility] INVASIVE CV LAB;  Service: Cardiovascular;  Laterality: N/A;   EYE SURGERY     left eye for Laser, diabetic retinopathy   FISTULOGRAM Right 11/06/2011   Procedure: FISTULOGRAM;  Surgeon: Redell LITTIE Door, MD;  Location: [facility] CATH LAB;  Service: Cardiovascular;  Laterality: Right;   HEMATOMA EVACUATION Right Feb. 25, 2014   HEMATOMA EVACUATION Right 12/14/2012   Procedure: EVACUATION HEMATOMA;  Surgeon: Redell LITTIE Door, MD;  Location: [facility] OR;  Service: Vascular;  Laterality: Right;   INCISION AND DRAINAGE Right 05/23/2019   Procedure: INCISION AND DRAINAGE OF RIGHT FOOT DEEP SPACE ABSCESS;  Surgeon: Gretel Ozell PARAS, DPM;  Location: [facility] OR;  Service: Podiatry;  Laterality: Right;   INJECTION OF SILICONE OIL Left 01/09/2023   Procedure: INJECTION OF SILICONE OIL;  Surgeon: Elner Arley LABOR, MD;  Location: [facility] OR;  Service: Ophthalmology;  Laterality: Left;   INSERTION OF DIALYSIS CATHETER  12/14/2012   Procedure: INSERTION OF DIALYSIS CATHETER;  Surgeon: Redell LITTIE Door, MD;  Location: [facility] OR;  Service: Vascular;;   INSERTION OF DIALYSIS CATHETER Right 01/07/2021   Procedure: INSERTION OF DIALYSIS CATHETER;  Surgeon: Harvey Carlin BRAVO, MD;  Location: [facility] OR;  Service: Vascular;  Laterality: Right;   INSERTION OF DIALYSIS CATHETER Left 01/08/2021   Procedure: INSERTION OF LEFT INTERNAL JUGULAR DIALYSIS CATHETER, tunneled;  Surgeon: Eliza Lonni RAMAN, MD;  Location: [facility] OR;  Service: Vascular;  Laterality: Left;   IRRIGATION AND DEBRIDEMENT FOOT Right 05/25/2019   Procedure: INCISION AND DRAINAGE FOOT;  Surgeon: Gretel Ozell PARAS, DPM;  Location: [facility] OR;  Service: Podiatry;  Laterality: Right;   IRRIGATION AND DEBRIDEMENT FOOT Right 05/27/2019   Procedure: IRRIGATION AND DEBRIDEMENT FOOT;  Surgeon: Gretel Ozell PARAS, DPM;  Location: [facility] OR;  Service: Podiatry;  Laterality: Right;   LASER PHOTO ABLATION Left 01/09/2023   Procedure: LASER PHOTO ABLATION;  Surgeon: Elner Arley LABOR, MD;  Location: [facility] OR;  Service: Ophthalmology;  Laterality: Left;   LEFT HEART CATH AND CORONARY ANGIOGRAPHY N/A 09/24/2020   Procedure: LEFT HEART CATH AND CORONARY ANGIOGRAPHY;  Surgeon: Swaziland, Peter M, MD;  Location: [facility] INVASIVE CV LAB;  Service: Cardiovascular;  Laterality: N/A;   LOWER EXTREMITY ANGIOGRAPHY Bilateral 05/17/2019   Procedure: LOWER EXTREMITY ANGIOGRAPHY;  Surgeon: Harvey Carlin BRAVO, MD;  Location: [facility] INVASIVE CV LAB;  Service: Cardiovascular;  Laterality: Bilateral;   PARS PLANA VITRECTOMY Left 01/09/2023   Procedure: PARS PLANA VITRECTOMY WITH 25 GAUGE AND REPAIR OF RETINAL DETACHMENT;  Surgeon: Elner Arley LABOR, MD;  Location: [facility] OR;  Service: Ophthalmology;  Laterality: Left;   REMOVAL OF A DIALYSIS CATHETER Right 01/08/2021   Procedure: REMOVAL OF TEMPORARY DIALYSIS CATHETER, LEFT FEMORAL VEIN;   Surgeon: Eliza Lonni RAMAN, MD;  Location: [facility] OR;  Service: Vascular;  Laterality: Right;   REVISON OF ARTERIOVENOUS FISTULA Right 12/14/2012   Procedure: REVISON OF upper arm ARTERIOVENOUS FISTULA using 16mmx10cm gortex graft;  Surgeon: Redell LITTIE Door, MD;  Location: [facility] OR;  Service: Vascular;  Laterality: Right;   REVISON OF ARTERIOVENOUS FISTULA Right 12/27/2020   Procedure: RIGHT UPPER EXTREMITY ARTERIOVENOUS FISTULA REVISiON;  Surgeon: Magda Debby SAILOR, MD;  Location: [facility] OR;  Service: Vascular;  Laterality: Right;  PERIPHERAL NERVE BLOCK   RIGHT/LEFT HEART CATH AND CORONARY ANGIOGRAPHY N/A 08/07/2020   Procedure: RIGHT/LEFT HEART CATH AND  CORONARY ANGIOGRAPHY;  Surgeon: Swaziland, Peter M, MD;  Location: [facility] INVASIVE CV LAB;  Service: Cardiovascular;  Laterality: N/A;   SOFT TISSUE MASS EXCISION     Right arm, Left leg  for MRSA infection   VENOUS ANGIOPLASTY  12/29/2023   Procedure: VENOUS ANGIOPLASTY;  Surgeon: Norine Manuelita LABOR, MD;  Location: [facility] INVASIVE CV LAB;  Service: Cardiovascular;;  Cephalic Arch; Intragraft; Distal Edge of Stent   VENOUS ANGIOPLASTY  03/17/2024   Procedure: VENOUS ANGIOPLASTY;  Surgeon: Gretta Lonni PARAS, MD;  Location: [facility] LAB;  Service: Cardiovascular;;    Family History: Family History  Problem Relation Age of Onset   Diabetes Mother     Social History: Social History   Socioeconomic History   Marital status: Married    Spouse name: Not on file   Number of children: 2   Years of education: 14   Highest education level: Not on file  Occupational History   Occupation: Disabled  Tobacco Use   Smoking status: Never    Passive exposure: Past   Smokeless tobacco: Never  Vaping Use   Vaping status: Never Used  Substance and Sexual Activity   Alcohol  use: No   Drug use: No   Sexual activity: Yes  Other Topics Concern   Not on file  Social History Narrative   Not on file   Social Drivers of Health   Financial Resource Strain: Not on file   Food Insecurity: No Food Insecurity (08/21/2022)   Received from [facility]   Hunger Vital Sign    Within the past 12 months, you worried that your food would run out before you got the money to buy more.: Never true    Within the past 12 months, the food you bought just didn't last and you didn't have money to get more.: Never true  Transportation Needs: No Transportation Needs (07/19/2022)   Received from [facility] - Transportation    In the past 12 months, has lack of transportation kept you from medical appointments or from getting medications?: No    Lack of Transportation (Non-Medical): No  Physical Activity: Inactive (01/19/2024)   Exercise Vital Sign    Days of Exercise per Week: 0 days    Minutes of Exercise per Session: 0 min  Stress: Stress Concern Present (01/19/2024)   [employer] - Occupational Stress Questionnaire    Feeling of Stress : Rather much  Social Connections: Not on file    Allergies:  Allergies  Allergen Reactions   Crestor [Rosuvastatin]     Other reaction(s): Muscle Pain   Lipitor [Atorvastatin ]     Other reaction(s): Muscle Pain   Amoxicillin Itching    Did it involve swelling of the face/tongue/throat, SOB, or low BP? Unknown Did it involve sudden or severe rash/hives, skin peeling, or any reaction on the inside of your mouth or nose? Unknown Did you need to seek medical attention at a hospital or doctor's office? Unknown When did it last happen?      20 years ago If all above answers are NO, may proceed with cephalosporin use.    Cefazolin  Other (See Comments) and Swelling    LIP SWELLING   Chlorhexidine  Hives    Patient has blistering   Hydromorphone  Itching    Patient states he may take with benadryl . Makes feet itch.   Oxycodone  Rash and Other (See Comments)   Percocet [Oxycodone -Acetaminophen ] Itching and Other (See Comments)  Legs only. Itching and burning  feeling. Can tolerate with hydroxyzine     Morphine      Other reaction(s): rash/itching All narcotics per pt   Gabapentin Nausea And Vomiting    Stomach issues     Objective:    Vital Signs:   Temp:  [98.6 F (37 C)] 98.6 F (37 C) (08/12 1044) Pulse Rate:  [63] 63 (08/12 1044) Resp:  [16] 16 (08/12 1044) BP: (145)/(95) 145/95 (08/12 1044) SpO2:  [100 %] 100 % (08/12 1308) Weight:  [74.8 kg] 74.8 kg (08/12 1044)    Weight change: Filed Weights   05/31/24 1044  Weight: 74.8 kg    Intake/Output:  No intake or output data in the 24 hours ending 05/31/24 1401    Physical Exam    General:   No resp difficulty + AVF Neck: JVP elevated.   Cor: Regular rate & rhythm. Lungs: clear Abdomen: soft, nontender, nondistended.  Extremities: no  edema Neuro: alert & oriented x3   Telemetry     EKG      Labs   Basic Metabolic Panel: Recent Labs  Lab 05/26/24 1246  NA 137  K 5.3*  CL 93*  CO2 19*  GLUCOSE 134*  BUN 50*  CREATININE 9.68*  CALCIUM  9.8    Liver Function Tests: No results for input(s): AST, ALT, ALKPHOS, BILITOT, PROT, ALBUMIN  in the last 168 hours. No results for input(s): LIPASE, AMYLASE in the last 168 hours. No results for input(s): AMMONIA in the last 168 hours.  CBC: Recent Labs  Lab 05/26/24 1246  WBC 3.8  HGB 10.9*  HCT 34.0*  MCV 87  PLT 107*    Cardiac Enzymes: No results for input(s): CKTOTAL, CKMB, CKMBINDEX, TROPONINI in the last 168 hours.  BNP: BNP (last 3 results) No results for input(s): BNP in the last 8760 hours.  ProBNP (last 3 results) No results for input(s): PROBNP in the last 8760 hours.   CBG: No results for input(s): GLUCAP in the last 168 hours.  Coagulation Studies: No results for input(s): LABPROT, INR in the last 72 hours.   Imaging   No results found.   Medications:     Current Medications:  aspirin   81 mg Oral Pre-Cath   sodium chloride  flush  3 mL  Intravenous Q12H    Infusions:  sodium chloride      famotidine      famotidine  20 mg (05/31/24 1340)      Patient Profile   [name] is a 26 with a history of HFrEF, ICM, HTN, HLD, DM, CAD, PAD, s/p left BKA transmetatarsal, ESRD 3 times a week. 2021 PCI to D2, PCI to mid LAD, and PCI to rPDA with drug eluting stents. 2022 in-stent stenosis s/p PCI to the LAD in 03/2021. Of note he has statin intolerance (rosuvastatin, atorvastatin , and zetia ).   Admitted from the cath lab with elevated filling pressures.    Assessment/Plan   A/C HFrEF, ICM  Echo 2023 ~ 40% but most recent Echo EF down 30-35%.  Cath today with multivessel disease and elevated filling pressures. CI 1.6.  Will need additional fluid remove with HD. May need another cath in few days to ensure optimization.  GDMT limited with ESRD.  Hold bb with low output.  Start entresto  24-26 mg twice a day.   CAD Cath with multivessel CAD. Intolerant statins  2021 PCI to D2, PCI to mid LAD, and PCI to rPDA with drug eluting stents. 2022 in-stent stenosis s/p PCI to the LAD.  Cath today multivessel disease.  Continue aspirin  81 mg daily.  Continue imdur  30 mg daily.  Intolerant rosuvastatin, atorvastatin , and zetia   ESRD Nephrology consulted. Filling pressures elevated on cath. Will need additional fluid removal.    HF Team will follow.  Length of Stay: 0  Greig Mosses, NP  05/31/2024, 2:01 PM    Advanced Heart Failure Team Pager [phone] (M-F; 7a - 5p)  Please contact [facility] for night-coverage after hours (4p -7a ) and weekends on [email or web address]

## 2024-05-31 NOTE — Assessment & Plan Note (Addendum)
-  He has persistent pseudoaneurysm, low access flow to his right arm AV fistula. He has had revision with previous interposition and multiple fistulogram's most recent 03/17/24.  -Plan is for new access.  -His vein mapping in the left arm shows small surface veins so likely would require graft with catheter placement. -Revision surgery scheduled for 05/30/24 by Dr Gretta.  -plan to optimize him from a cardiac standpoint prior to surgery

## 2024-05-31 NOTE — H&P (Signed)
 History and Physical    Patient: [name] DOB: [birth year] DOA: 05/31/2024 DOS: the patient was seen and examined on 05/31/2024 PCP: Shelda Atlas, MD  Patient coming from: hospital - lives with his wife. Uses prosthetic and cane to ambulate.    Chief Complaint: volume overload/HTN   HPI: [name] is a 58 y.o. male with medical history significant of ESRD on HD, T2DM, HTN, HLD, PAD, HFrEF, ICM, CAD, s/p left BKA & right transmetatarsal amputation who underwent LHC today to evaluate new reduced EF in setting of upcoming surgeries. After the surgery he was noted to have volume overload and persistent elevated blood pressures so we were asked to admit to optimize volume/cardiac output for upcoming surgeries. He is feeling fine. No complaints, would like to eat.   He gets dialysis on MWF, last session on Monday. Fluid removal limited by flow from AV, hypotension.   He has persistent pseudoaneurysm, low access flow to his right arm AV fistula. He has had revision with previous interposition and multiple fistulogram's most recent 03/17/24. Plan is for new access. His vein mapping in the left arm shows small surface veins so likely would require graft with catheter placement. Revision surgery scheduled for 05/30/24 by Dr Gretta.     Denies any fever/chills, vision changes/headaches, chest pain or palpitations, shortness of breath or cough, abdominal pain, N/V/D, dysuria or leg swelling.    He does not drink or smoke.   Hospital Course: Came in for scheduled LHC due to new HFrEF.  Vitals: afebrile, bp: 145/95, HR:63, RR: 16, oxygen : 100%RA Pertinent labs: none ordered No imaging  LHC: Severe multivessel CAD with occluded prior stents in the LAD and left circumflex Territory supplied by these vessels is largely infarcted based on stress test findings.  Therefore, I do not think he will benefit from revascularization to these vessels. 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 Recommend medical admission, with heart failure team input (Dr. Zenaida already aware)   Review of Systems: As mentioned in the history of present illness. All other systems reviewed and are negative. Past Medical History:  Diagnosis Date   Acute osteomyelitis of right foot (HCC) 05/15/2019   Anemia 06/23/2019   Aortic stenosis    Bursitis    right shoulder   Complication of anesthesia    Coronary artery disease    Diabetes with renal manifestations(250.4) 08/23/2013   Diabetic infection of right foot (HCC)    Diabetic retinopathy associated with type 2 diabetes mellitus (HCC) 06/23/2019   ESRD on hemodialysis (HCC) 07/18/2011   Essential hypertension 10/18/2007   Qualifier: Diagnosis of  By: Kassie MD, Alyce A    History of blood transfusion    HYPERLIPIDEMIA 05/16/2008   Qualifier: Diagnosis of  By: Norleen MD, [name] ORN    MRSA INFECTION 10/18/2007   Qualifier: Diagnosis of  By: Wilhemina KRAFT, Lucy     Other forms of retinal detachment(361.89) 06/23/2019   PAD (peripheral artery disease) (HCC)    PONV (postoperative nausea and vomiting)    Prolonged QT interval    Pulmonary hypertension (HCC)    Secondary hyperparathyroidism (HCC) 06/23/2019   Sepsis (HCC) 05/14/2019   Past Surgical History:  Procedure Laterality Date   A/V FISTULAGRAM N/A 12/29/2023   Procedure: A/V Fistulagram;  Surgeon: Norine Manuelita LABOR, MD;  Location: [facility] INVASIVE CV LAB;  Service: Cardiovascular;  Laterality: N/A;   A/V  SHUNT INTERVENTION N/A 03/17/2024   Procedure: A/V SHUNT INTERVENTION;  Surgeon: Gretta Lonni PARAS, MD;  Location: [facility] PV LAB;  Service: Cardiovascular;  Laterality: N/A;  cephalic arch   ABDOMINAL AORTOGRAM N/A 05/17/2019   Procedure: ABDOMINAL AORTOGRAM;  Surgeon: Harvey Carlin BRAVO, MD;  Location: [facility] INVASIVE CV LAB;  Service: Cardiovascular;  Laterality: N/A;    AMPUTATION TOE Right 05/14/2019   Procedure: PARTIAL AMPUTATION SECOND TOE RIGHT FOOT;  Surgeon: Gretel Ozell PARAS, DPM;  Location: [facility] OR;  Service: Podiatry;  Laterality: Right;   AMPUTATION TOE Right 05/23/2019   Procedure: AMPUTATION OF SECOND TOE METATARSAL PHALANGEAL JOINT;  Surgeon: Gretel Ozell PARAS, DPM;  Location: [facility] OR;  Service: Podiatry;  Laterality: Right;   AMPUTATION TOE Right 05/27/2019   Procedure: Amputation Toe;  Surgeon: Gretel Ozell PARAS, DPM;  Location: [facility] OR;  Service: Podiatry;  Laterality: Right;  right third toe   APPLICATION OF WOUND VAC Right 05/27/2019   Procedure: Application Of Wound Vac;  Surgeon: Gretel Ozell PARAS, DPM;  Location: [facility] OR;  Service: Podiatry;  Laterality: Right;   AV FISTULA PLACEMENT  06-18-11   Right brachiocephalic AVF   BONE BIOPSY Right 05/14/2019   Procedure: OPEN SUPERFICIAL BONE BIOPSY GREAT TOE;  Surgeon: Gretel Ozell PARAS, DPM;  Location: [facility] OR;  Service: Podiatry;  Laterality: Right;   CARDIAC CATHETERIZATION     CORONARY BALLOON ANGIOPLASTY N/A 09/24/2020   Procedure: CORONARY BALLOON ANGIOPLASTY;  Surgeon: Swaziland, Peter M, MD;  Location: [facility] INVASIVE CV LAB;  Service: Cardiovascular;  Laterality: N/A;   CORONARY PRESSURE/FFR STUDY N/A 09/24/2020   Procedure: INTRAVASCULAR PRESSURE WIRE/FFR STUDY;  Surgeon: Swaziland, Peter M, MD;  Location: [facility] INVASIVE CV LAB;  Service: Cardiovascular;  Laterality: N/A;   CORONARY STENT INTERVENTION N/A 09/24/2020   Procedure: CORONARY STENT INTERVENTION;  Surgeon: Swaziland, Peter M, MD;  Location: [facility] INVASIVE CV LAB;  Service: Cardiovascular;  Laterality: N/A;   EYE SURGERY     left eye for Laser, diabetic retinopathy   FISTULOGRAM Right 11/06/2011   Procedure: FISTULOGRAM;  Surgeon: Redell LITTIE Door, MD;  Location: [facility] CATH LAB;  Service: Cardiovascular;  Laterality: Right;   HEMATOMA EVACUATION Right Feb. 25, 2014   HEMATOMA EVACUATION Right 12/14/2012   Procedure: EVACUATION HEMATOMA;  Surgeon: Redell LITTIE Door, MD;  Location:  [facility] OR;  Service: Vascular;  Laterality: Right;   INCISION AND DRAINAGE Right 05/23/2019   Procedure: INCISION AND DRAINAGE OF RIGHT FOOT DEEP SPACE ABSCESS;  Surgeon: Gretel Ozell PARAS, DPM;  Location: [facility] OR;  Service: Podiatry;  Laterality: Right;   INJECTION OF SILICONE OIL Left 01/09/2023   Procedure: INJECTION OF SILICONE OIL;  Surgeon: Elner Arley LABOR, MD;  Location: [facility] OR;  Service: Ophthalmology;  Laterality: Left;   INSERTION OF DIALYSIS CATHETER  12/14/2012   Procedure: INSERTION OF DIALYSIS CATHETER;  Surgeon: Redell LITTIE Door, MD;  Location: [facility] OR;  Service: Vascular;;   INSERTION OF DIALYSIS CATHETER Right 01/07/2021   Procedure: INSERTION OF DIALYSIS CATHETER;  Surgeon: Harvey Carlin BRAVO, MD;  Location: [facility] OR;  Service: Vascular;  Laterality: Right;   INSERTION OF DIALYSIS CATHETER Left 01/08/2021   Procedure: INSERTION OF LEFT INTERNAL JUGULAR DIALYSIS CATHETER, tunneled;  Surgeon: Eliza Lonni RAMAN, MD;  Location: [facility] OR;  Service: Vascular;  Laterality: Left;   IRRIGATION AND DEBRIDEMENT FOOT Right 05/25/2019   Procedure: INCISION AND DRAINAGE FOOT;  Surgeon: Gretel Ozell PARAS, DPM;  Location: [facility] OR;  Service: Podiatry;  Laterality: Right;   IRRIGATION AND DEBRIDEMENT  FOOT Right 05/27/2019   Procedure: IRRIGATION AND DEBRIDEMENT FOOT;  Surgeon: Gretel Ozell PARAS, DPM;  Location: [facility] OR;  Service: Podiatry;  Laterality: Right;   LASER PHOTO ABLATION Left 01/09/2023   Procedure: LASER PHOTO ABLATION;  Surgeon: Elner Arley LABOR, MD;  Location: [facility] OR;  Service: Ophthalmology;  Laterality: Left;   LEFT HEART CATH AND CORONARY ANGIOGRAPHY N/A 09/24/2020   Procedure: LEFT HEART CATH AND CORONARY ANGIOGRAPHY;  Surgeon: Swaziland, Peter M, MD;  Location: [facility] INVASIVE CV LAB;  Service: Cardiovascular;  Laterality: N/A;   LOWER EXTREMITY ANGIOGRAPHY Bilateral 05/17/2019   Procedure: LOWER EXTREMITY ANGIOGRAPHY;  Surgeon: Harvey Carlin BRAVO, MD;  Location: [facility] INVASIVE CV LAB;  Service: Cardiovascular;  Laterality:  Bilateral;   PARS PLANA VITRECTOMY Left 01/09/2023   Procedure: PARS PLANA VITRECTOMY WITH 25 GAUGE AND REPAIR OF RETINAL DETACHMENT;  Surgeon: Elner Arley LABOR, MD;  Location: [facility] OR;  Service: Ophthalmology;  Laterality: Left;   REMOVAL OF A DIALYSIS CATHETER Right 01/08/2021   Procedure: REMOVAL OF TEMPORARY DIALYSIS CATHETER, LEFT FEMORAL VEIN;  Surgeon: Eliza Lonni RAMAN, MD;  Location: [facility] OR;  Service: Vascular;  Laterality: Right;   REVISON OF ARTERIOVENOUS FISTULA Right 12/14/2012   Procedure: REVISON OF upper arm ARTERIOVENOUS FISTULA using 46mmx10cm gortex graft;  Surgeon: Redell LITTIE Door, MD;  Location: [facility] OR;  Service: Vascular;  Laterality: Right;   REVISON OF ARTERIOVENOUS FISTULA Right 12/27/2020   Procedure: RIGHT UPPER EXTREMITY ARTERIOVENOUS FISTULA REVISiON;  Surgeon: Magda Debby SAILOR, MD;  Location: [facility] OR;  Service: Vascular;  Laterality: Right;  PERIPHERAL NERVE BLOCK   RIGHT/LEFT HEART CATH AND CORONARY ANGIOGRAPHY N/A 08/07/2020   Procedure: RIGHT/LEFT HEART CATH AND CORONARY ANGIOGRAPHY;  Surgeon: Swaziland, Peter M, MD;  Location: [facility] INVASIVE CV LAB;  Service: Cardiovascular;  Laterality: N/A;   SOFT TISSUE MASS EXCISION     Right arm, Left leg  for MRSA infection   VENOUS ANGIOPLASTY  12/29/2023   Procedure: VENOUS ANGIOPLASTY;  Surgeon: Norine Manuelita LABOR, MD;  Location: [facility] INVASIVE CV LAB;  Service: Cardiovascular;;  Cephalic Arch; Intragraft; Distal Edge of Stent   VENOUS ANGIOPLASTY  03/17/2024   Procedure: VENOUS ANGIOPLASTY;  Surgeon: Gretta Lonni PARAS, MD;  Location: [facility];  Service: Cardiovascular;;   Social History:  reports that he has never smoked. He has been exposed to tobacco smoke. He has never used smokeless tobacco. He reports that he does not drink alcohol  and does not use drugs.  Allergies  Allergen Reactions   Crestor [Rosuvastatin]     Other reaction(s): Muscle Pain   Fentanyl  Itching    Patient received Fentanyl  during procedure and had reaction,  medicinal intervention applied.    Lipitor [Atorvastatin ]     Other reaction(s): Muscle Pain   Amoxicillin Itching    Did it involve swelling of the face/tongue/throat, SOB, or low BP? Unknown Did it involve sudden or severe rash/hives, skin peeling, or any reaction on the inside of your mouth or nose? Unknown Did you need to seek medical attention at a hospital or doctor's office? Unknown When did it last happen?      20 years ago If all above answers are NO, may proceed with cephalosporin use.    Cefazolin  Other (See Comments) and Swelling    LIP SWELLING   Chlorhexidine  Hives    Patient has blistering   Hydromorphone  Itching    Patient states he may take with benadryl . Makes feet itch.   Oxycodone  Rash and Other (See Comments)  Percocet [Oxycodone -Acetaminophen ] Itching and Other (See Comments)    Legs only. Itching and burning feeling. Can tolerate with hydroxyzine     Morphine      Other reaction(s): rash/itching All narcotics per pt   Gabapentin Nausea And Vomiting    Stomach issues     Family History  Problem Relation Age of Onset   Diabetes Mother     Prior to Admission medications   Medication Sig Start Date End Date Taking? Authorizing Provider  aspirin  EC 81 MG tablet Take 1 tablet (81 mg total) by mouth daily. 06/28/19  Yes Darron Deatrice LABOR, MD  EPINEPHrine  (EPIPEN  2-PAK) 0.3 mg/0.3 mL IJ SOAJ injection Inject 0.3 mg into the muscle as needed for anaphylaxis.   Yes [provider]  hydrOXYzine  (VISTARIL ) 50 MG capsule Take 50 mg by mouth every 6 (six) hours as needed for itching. 05/26/24  Yes [provider]  isosorbide  mononitrate (IMDUR ) 30 MG 24 hr tablet Take 30 mg by mouth daily. 03/29/24  Yes [provider]  metoprolol  succinate (TOPROL  XL) 100 MG 24 hr tablet Take 1 tablet (100 mg total) by mouth daily. Take with or immediately following a meal. 03/30/24  Yes Patwardhan, Manish J, MD  multivitamin (RENA-VIT) TABS tablet Take 1 tablet  by mouth daily with lunch.   Yes [provider]  nitroGLYCERIN  (NITROSTAT ) 0.4 MG SL tablet Place 0.4 mg under the tongue every 5 (five) minutes as needed for chest pain.   Yes [provider]  ondansetron  (ZOFRAN -ODT) 8 MG disintegrating tablet Take 8 mg by mouth 3 (three) times daily. 05/26/24  Yes [provider]  pantoprazole  (PROTONIX ) 40 MG tablet Take 40 mg by mouth daily.   Yes [provider]  prednisoLONE  acetate (PRED FORTE ) 1 % ophthalmic suspension Place 1 drop into the right eye daily. 12/08/23  Yes [provider]  sevelamer  carbonate (RENVELA ) 800 MG tablet Take 1,600-2,400 mg by mouth 3 (three) times daily with meals. 07/25/20  Yes [provider]  sodium zirconium cyclosilicate  (LOKELMA ) 10 g PACK packet Take 10 g by mouth every Tuesday, Thursday, Saturday, and Sunday.   Yes [provider]  Tenapanor HCl, CKD, (XPHOZAH) 30 MG TABS Take 30 mg by mouth daily as needed (when directed). 04/08/23  Yes [provider]  traZODone  (DESYREL ) 150 MG tablet Take 150 mg by mouth at bedtime. 07/15/21  Yes [provider]  triamcinolone cream (KENALOG) 0.1 % Apply 1 Application topically 2 (two) times daily as needed (ITCHING).   Yes [provider]  ZYLET  0.5-0.3 % SUSP Place 1 drop into the right eye daily. 03/23/24  Yes [provider]  Continuous Blood Gluc Sensor (FREESTYLE LIBRE 14 DAY SENSOR) MISC See admin instructions. 06/20/19   [provider]  sacubitril -valsartan  (ENTRESTO ) 24-26 MG Take 1 tablet by mouth 2 (two) times daily. START AFTER HEART CATH 05/26/24   Patwardhan, Newman PARAS, MD  loratadine  (CLARITIN ) 10 MG tablet Take 10 mg by mouth daily as needed for allergies.  11/06/19  [provider]    Physical Exam: Vitals:   05/31/24 1417 05/31/24 1450 05/31/24 1700 05/31/24 1715  BP: (!) 158/101  (!) 158/94 (!) 158/91  Pulse: (!) 0 66 65   Resp:  (!) 23 18   Temp:   98.6 F  (37 C)   TempSrc:   Oral   SpO2:  94% 92% 94%  Weight:   77.7 kg   Height:   5' 10 (1.778 m)  General:  Appears calm and comfortable and is in NAD Eyes:  PERRL, EOMI, normal lids, iris ENT:  grossly normal hearing, lips & tongue, mmm; appropriate dentition Neck:  no LAD, masses or thyromegaly; no carotid bruits Cardiovascular:  RRR, +systolic murmur  No LE edema.  Respiratory:   CTA bilaterally with no wheezes/rales/rhonchi.  Normal respiratory effort. Abdomen:  soft, NT, ND, NABS Back:   normal alignment, no CVAT Skin:  no rash or induration seen on limited exam. Fistula of RUE Musculoskeletal:  grossly normal tone BUE/BLE, good ROM, no bony abnormality. Left BKA, right transmetatarsal amputation  Lower extremity:  No LE edema.  Limited foot exam with no ulcerations.   Psychiatric:  grossly normal mood and affect, speech fluent and appropriate, AOx3 Neurologic:  CN 2-12 grossly intact, moves all extremities in coordinated fashion, sensation intact   Radiological Exams on Admission: Independently reviewed - see discussion in A/P where applicable  CARDIAC CATHETERIZATION Result Date: 05/31/2024 Images from the original result were not included. Coronary angiography 05/31/2024: LM: No significant disease LAD: Occluded mid LAD stent        Faint collaterals filling distal LAD        Diag 2 with 80% stenosis Ramus: Small caliber vessel, prox 90% stenosis Lcx: Prox 70%, mid 80% stenoses. Occluded OM 3 stent RCA: Large, dominant vessel          Mid focal 60% stenosis         LVEDP 22 mmHg Right heart catheterization 05/31/2024: RA: 22 mmHg RV: 105/10 mmHg PA: 109/56 mmHg, mPAP 81 mmHg PCW: 35 mmHg AO sats: 96% PA sats: 41% Fick: CO: 3.1 L/min CI: 1.6 L/min/m2 Thermodilution: CO: 3.0 L/min CI: 1.5 L/min/m2 Conclusion: Severe multivessel CAD with occluded prior stents in the LAD and left circumflex Territory supplied by these vessels is largely infarcted based on stress test findings.  Therefore,  I do not think he will benefit from revascularization to these vessels. 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 Recommend medical admission, with heart failure team input (Dr. Zenaida already aware) [name], MD    EKG: Independently reviewed.  NSR with rate 66; nonspecific ST changes with no evidence of acute ischemia. Prolonged QT    Labs on Admission: I have personally reviewed the available labs and imaging studies at the time of the admission.  Pertinent labs:   Pending   Assessment and Plan: Principal Problem:   Acute on chronic systolic heart failure (HCC) Active Problems:   Complication of AV dialysis fistula   Prolonged QT interval   Essential hypertension   ESRD on dialysis (HCC)   CAD (coronary artery disease)   PAD (peripheral artery disease) (HCC)   Dyslipidemia   Type 2 diabetes mellitus (HCC)   Pulmonary hypertension, unspecified (HCC)   Iron  deficiency anemia, unspecified    Assessment and Plan: * Acute on chronic systolic heart failure (HCC) 58 year old male presenting for LHC found to be in decompensated systolic heart failure with need to optimize volume and cardiac function for upcoming surgeries  -admit to tele  -echo 04/2024 with new systolic findings of EF of 30-35%. LHC today with findings of severe multivessel CAD with occluded prior stents in the LAD and left circumflex. Decompensated ischemic CM. Severe pulmonary HTN, likely WHO group 2  -volume control per nephrology. Limited volume removal with poor flow from fistula, low blood pressures. Declined extra session today  -  heart failure team started low dose entresto   -strict I/O and daily weights  -HF team following and will need to make sure they clear him for surgery   Complication of AV dialysis fistula -He has persistent pseudoaneurysm, low access flow to his right arm AV fistula.  He has had revision with previous interposition and multiple fistulogram's most recent 03/17/24.  -Plan is for new access.  -His vein mapping in the left arm shows small surface veins so likely would require graft with catheter placement. -Revision surgery scheduled for 05/30/24 by Dr Gretta.  -plan to optimize him from a cardiac standpoint prior to surgery    Prolonged QT interval Optimize electrolytes Keep on telemetry Avoid qt prolonging drugs  Repeat ekg in AM    Essential hypertension Holding beta blocker with low ouput per HF team  Continue imdur  HF starting entresto  24-26mg  post LHC  PRN hydralazine    ESRD on dialysis [facility]) Dialysis on MWF, last session yesterday  Nephrology consulted, appreciate assistance Volume control per HD  No labs, have ordered to check potassium/electrolytes   CAD (coronary artery disease) -LHC today: Severe multivessel CAD with occluded prior stents in the LAD and left circumflex Territory supplied by these vessels is largely infarcted based on stress test findings.  Therefore, I do not think he will benefit from revascularization to these vessels. -continue medical management  -statin intolerant -LpA pending for AM  -per cardiology   PAD (peripheral artery disease) (HCC) Continue ASA  Statin intolerant   Dyslipidemia Statin intolerant   Type 2 diabetes mellitus (HCC) No recent A1C, pending  Very sensitive SSI and accuchecks QAC/HS   Pulmonary hypertension, unspecified (HCC) Severe pulmonary HTN, likely WHO group 2, per LHC today   Iron  deficiency anemia, unspecified Cbc pending  Baseline hgb around 10     Advance Care Planning:   Code Status: Full Code   Consults: heart failure team, nephrology   DVT Prophylaxis: heparin  [place]   Family Communication: updated his wife by phone.   Severity of Illness: The appropriate patient status for this patient is INPATIENT. Inpatient status is judged to be reasonable and necessary in  order to provide the required intensity of service to ensure the patient's safety. The patient's presenting symptoms, physical exam findings, and initial radiographic and laboratory data in the context of their chronic comorbidities is felt to place them at high risk for further clinical deterioration. Furthermore, it is not anticipated that the patient will be medically stable for discharge from the hospital within 2 midnights of admission.   * I certify that at the point of admission it is my clinical judgment that the patient will require inpatient hospital care spanning beyond 2 midnights from the point of admission due to high intensity of service, high risk for further deterioration and high frequency of surveillance required.*  Author: Isaiah Geralds, MD 05/31/2024 5:33 PM  For on call review [email or web address].

## 2024-05-31 NOTE — Assessment & Plan Note (Addendum)
 Dialysis on MWF, last session yesterday  Nephrology consulted, appreciate assistance Volume control per HD  No labs, have ordered to check potassium/electrolytes

## 2024-05-31 NOTE — Assessment & Plan Note (Addendum)
 Holding beta blocker with low ouput per HF team  Continue imdur  HF starting entresto  24-26mg  post LHC  PRN hydralazine 

## 2024-05-31 NOTE — Assessment & Plan Note (Signed)
 Optimize electrolytes Keep on telemetry Avoid qt prolonging drugs  Repeat ekg in AM

## 2024-05-31 NOTE — Assessment & Plan Note (Signed)
 58 year old male presenting for LHC found to be in decompensated systolic heart failure with need to optimize volume and cardiac function for upcoming surgeries  -admit to tele  -echo 04/2024 with new systolic findings of EF of 30-35%. LHC today with findings of severe multivessel CAD with occluded prior stents in the LAD and left circumflex. Decompensated ischemic CM. Severe pulmonary HTN, likely WHO group 2  -volume control per nephrology. Limited volume removal with poor flow from fistula, low blood pressures. Declined extra session today  -heart failure team started low dose entresto   -strict I/O and daily weights  -HF team following and will need to make sure they clear him for surgery

## 2024-06-01 DIAGNOSIS — I5023 Acute on chronic systolic (congestive) heart failure: Secondary | ICD-10-CM | POA: Diagnosis not present

## 2024-06-01 LAB — COMPREHENSIVE METABOLIC PANEL WITH GFR
ALT: 13 U/L (ref 0–44)
AST: 16 U/L (ref 15–41)
Albumin: 3.7 g/dL (ref 3.5–5.0)
Alkaline Phosphatase: 91 U/L (ref 38–126)
Anion gap: 18 — ABNORMAL HIGH (ref 5–15)
BUN: 51 mg/dL — ABNORMAL HIGH (ref 6–20)
CO2: 16 mmol/L — ABNORMAL LOW (ref 22–32)
Calcium: 9.2 mg/dL (ref 8.9–10.3)
Chloride: 100 mmol/L (ref 98–111)
Creatinine, Ser: 9.44 mg/dL — ABNORMAL HIGH (ref 0.61–1.24)
GFR, Estimated: 6 mL/min — ABNORMAL LOW (ref >60)
Glucose, Bld: 251 mg/dL — ABNORMAL HIGH (ref 70–99)
Potassium: 6 mmol/L — ABNORMAL HIGH (ref 3.5–5.1)
Sodium: 134 mmol/L — ABNORMAL LOW (ref 135–145)
Total Bilirubin: 1.5 mg/dL — ABNORMAL HIGH (ref 0.0–1.2)
Total Protein: 6.3 g/dL — ABNORMAL LOW (ref 6.5–8.1)

## 2024-06-01 LAB — GLUCOSE, CAPILLARY
Glucose-Capillary: 229 mg/dL — ABNORMAL HIGH (ref 70–99)
Glucose-Capillary: 204 mg/dL — ABNORMAL HIGH (ref 70–99)
Glucose-Capillary: 249 mg/dL — ABNORMAL HIGH (ref 70–99)

## 2024-06-01 LAB — HEMOGLOBIN A1C
Hgb A1c MFr Bld: 7.7 % — ABNORMAL HIGH (ref 4.8–5.6)
Mean Plasma Glucose: 174 mg/dL

## 2024-06-01 LAB — HEPATITIS B SURFACE ANTIBODY, QUANTITATIVE: Hep B S AB Quant (Post): 26 m[IU]/mL

## 2024-06-01 MED ORDER — ONDANSETRON HCL 4 MG/2ML IJ SOLN
4.0000 mg | Freq: Once | INTRAMUSCULAR | Status: DC
  Filled 2024-06-01: qty 2

## 2024-06-01 MED ORDER — LOPERAMIDE HCL 2 MG PO CAPS
ORAL_CAPSULE | ORAL | Status: AC
  Filled 2024-06-01: qty 1

## 2024-06-01 MED ORDER — SACUBITRIL-VALSARTAN 49-51 MG PO TABS
1.0000 | ORAL_TABLET | Freq: Once | ORAL | Status: AC
  Administered 2024-06-01 (×2): 1 via ORAL
  Filled 2024-06-01: qty 1

## 2024-06-01 MED ORDER — CALCIUM GLUCONATE-NACL 1-0.675 GM/50ML-% IV SOLN
1.0000 g | Freq: Once | INTRAVENOUS | Status: AC
  Administered 2024-06-01 (×2): 1000 mg via INTRAVENOUS
  Filled 2024-06-01: qty 50

## 2024-06-01 MED ORDER — SACUBITRIL-VALSARTAN 49-51 MG PO TABS
1.0000 | ORAL_TABLET | Freq: Two times a day (BID) | ORAL | Status: DC
  Administered 2024-06-01 – 2024-06-04 (×7): 1 via ORAL
  Filled 2024-06-01 (×8): qty 1

## 2024-06-01 MED ORDER — SODIUM ZIRCONIUM CYCLOSILICATE 10 G PO PACK
10.0000 g | PACK | ORAL | Status: AC
  Administered 2024-06-01 (×4): 10 g via ORAL
  Filled 2024-06-01 (×2): qty 1

## 2024-06-01 MED ORDER — ONDANSETRON HCL 4 MG/2ML IJ SOLN
4.0000 mg | Freq: Once | INTRAMUSCULAR | Status: AC
  Administered 2024-06-01 (×2): 4 mg via INTRAVENOUS
  Filled 2024-06-01: qty 2

## 2024-06-01 MED ORDER — LOPERAMIDE HCL 2 MG PO CAPS
2.0000 mg | ORAL_CAPSULE | ORAL | Status: DC | PRN
  Administered 2024-06-01 (×4): 2 mg via ORAL
  Filled 2024-06-01 (×2): qty 1

## 2024-06-01 MED ORDER — INSULIN ASPART 100 UNIT/ML IJ SOLN
2.0000 [IU] | Freq: Three times a day (TID) | INTRAMUSCULAR | Status: DC
  Administered 2024-06-04 – 2024-06-05 (×4): 2 [IU] via SUBCUTANEOUS

## 2024-06-01 NOTE — Consult Note (Signed)
  Hospital Consult   Patient currently admitted with Acute on chronic HF with severe Pulmonary HTN along with multiple other comorbidities. He has a currently functioning right AV fistula. Fistula has undergone multiple interventions and patient needs AV graft in LUE. He was initially scheduled for 8/11, but has been postponed for pre operative cardiac workup. He is scheduled for another heart Cath on Friday. With his current high risk surgical status and OR availability we would not pursue inpatient access potentially until early next week. Please reach back out to Vascular following heart catheterization Friday or on Monday to re evaluate AV Graft placement  Lenwood Balsam PA-C [facility] [phone] 06/01/2024  3:33 PM

## 2024-06-01 NOTE — Plan of Care (Signed)
 Problem: Education: Goal: Understanding of CV disease, CV risk reduction, and recovery process will improve 06/01/2024 0747 by Tawni Greig RAMAN, RN Outcome: Progressing 05/31/2024 1821 by Tawni Greig RAMAN, RN Outcome: Progressing   Problem: Activity: Goal: Ability to return to baseline activity level will improve 06/01/2024 0747 by Tawni Greig RAMAN, RN Outcome: Progressing 05/31/2024 1821 by Tawni Greig RAMAN, RN Outcome: Progressing   Problem: Cardiovascular: Goal: Ability to achieve and maintain adequate cardiovascular perfusion will improve 06/01/2024 0747 by Tawni Greig RAMAN, RN Outcome: Progressing 05/31/2024 1821 by Tawni Greig RAMAN, RN Outcome: Progressing Goal: Vascular access site(s) Level 0-1 will be maintained 06/01/2024 0747 by Tawni Greig RAMAN, RN Outcome: Progressing 05/31/2024 1821 by Tawni Greig RAMAN, RN Outcome: Progressing   Problem: Health Behavior/Discharge Planning: Goal: Ability to safely manage health-related needs after discharge will improve 06/01/2024 0747 by Tawni Greig RAMAN, RN Outcome: Progressing 05/31/2024 1821 by Tawni Greig RAMAN, RN Outcome: Progressing   Problem: Education: Goal: Knowledge of General Education information will improve Description: Including pain rating scale, medication(s)/side effects and non-pharmacologic comfort measures 06/01/2024 0747 by Tawni Greig RAMAN, RN Outcome: Progressing 05/31/2024 1821 by Tawni Greig RAMAN, RN Outcome: Progressing   Problem: Health Behavior/Discharge Planning: Goal: Ability to manage health-related needs will improve 06/01/2024 0747 by Tawni Greig RAMAN, RN Outcome: Progressing 05/31/2024 1821 by Tawni Greig RAMAN, RN Outcome: Progressing   Problem: Clinical Measurements: Goal: Ability to maintain clinical measurements within normal limits will improve Outcome: Progressing Goal: Will remain free from infection 06/01/2024 0747 by Tawni Greig RAMAN, RN Outcome: Progressing 05/31/2024 1821 by Tawni Greig RAMAN, RN Outcome:  Progressing Goal: Diagnostic test results will improve Outcome: Progressing Goal: Respiratory complications will improve 06/01/2024 0747 by Tawni Greig RAMAN, RN Outcome: Progressing 05/31/2024 1821 by Tawni Greig RAMAN, RN Outcome: Progressing Goal: Cardiovascular complication will be avoided 06/01/2024 0747 by Tawni Greig RAMAN, RN Outcome: Progressing 05/31/2024 1821 by Tawni Greig RAMAN, RN Outcome: Progressing   Problem: Activity: Goal: Risk for activity intolerance will decrease 06/01/2024 0747 by Tawni Greig RAMAN, RN Outcome: Progressing 05/31/2024 1821 by Tawni Greig RAMAN, RN Outcome: Progressing   Problem: Nutrition: Goal: Adequate nutrition will be maintained 06/01/2024 0747 by Tawni Greig RAMAN, RN Outcome: Progressing 05/31/2024 1821 by Tawni Greig RAMAN, RN Outcome: Progressing   Problem: Elimination: Goal: Will not experience complications related to bowel motility 06/01/2024 0747 by Tawni Greig RAMAN, RN Outcome: Progressing 05/31/2024 1821 by Tawni Greig RAMAN, RN Outcome: Progressing Goal: Will not experience complications related to urinary retention Outcome: Progressing   Problem: Safety: Goal: Ability to remain free from injury will improve 06/01/2024 0747 by Tawni Greig RAMAN, RN Outcome: Progressing 05/31/2024 1821 by Tawni Greig RAMAN, RN Outcome: Progressing   Problem: Skin Integrity: Goal: Risk for impaired skin integrity will decrease 06/01/2024 0747 by Tawni Greig RAMAN, RN Outcome: Progressing 05/31/2024 1821 by Tawni Greig RAMAN, RN Outcome: Progressing   Problem: Education: Goal: Ability to describe self-care measures that may prevent or decrease complications (Diabetes Survival Skills Education) will improve Outcome: Progressing   Problem: Coping: Goal: Ability to adjust to condition or change in health will improve 06/01/2024 0747 by Tawni Greig RAMAN, RN Outcome: Progressing 05/31/2024 1821 by Tawni Greig RAMAN, RN Outcome: Progressing   Problem: Fluid Volume: Goal: Ability to  maintain a balanced intake and output will improve Outcome: Progressing   Problem: Health Behavior/Discharge Planning: Goal: Ability to identify and utilize available resources and services will improve 06/01/2024 0747 by Tawni Greig RAMAN, RN Outcome: Progressing 05/31/2024 1821 by Tawni,  Petra Dumler S, RN Outcome: Progressing Goal: Ability to manage health-related needs will improve Outcome: Progressing   Problem: Nutritional: Goal: Maintenance of adequate nutrition will improve Outcome: Progressing Goal: Progress toward achieving an optimal weight will improve Outcome: Progressing   Problem: Skin Integrity: Goal: Risk for impaired skin integrity will decrease 06/01/2024 0747 by Tawni Greig RAMAN, RN Outcome: Progressing 05/31/2024 1821 by Tawni Greig RAMAN, RN Outcome: Progressing   Problem: Tissue Perfusion: Goal: Adequacy of tissue perfusion will improve Outcome: Progressing   Problem: Coping: Goal: Level of anxiety will decrease 06/01/2024 0747 by Tawni Greig RAMAN, RN Outcome: Not Progressing 05/31/2024 1821 by Tawni Greig RAMAN, RN Outcome: Not Progressing   Problem: Pain Managment: Goal: General experience of comfort will improve and/or be controlled 06/01/2024 0747 by Tawni Greig RAMAN, RN Outcome: Not Progressing 05/31/2024 1821 by Tawni Greig RAMAN, RN Outcome: Progressing   Problem: Metabolic: Goal: Ability to maintain appropriate glucose levels will improve 06/01/2024 0747 by Tawni Greig RAMAN, RN Outcome: Not Progressing 05/31/2024 1821 by Tawni Greig RAMAN, RN Outcome: Progressing

## 2024-06-01 NOTE — Progress Notes (Addendum)
 PROGRESS NOTE    [name]  FMW:[id number] DOB: [birth year] DOA: 05/31/2024 PCP: Shelda Atlas, MD  58 y.o. 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  Presented for elective R/LHC as part of pre-op  evaluation for hernia surgery and fistula revision. He was admitted for optimization given h/o low-output HF, volume overload and severe pulmonary hypertension. He gets dialysis on MWF, last session on Monday. Fluid removal limited by flow from AV, hypotension.  He has persistent pseudoaneurysm, low access flow to his right arm AV fistula. He has had revision with previous interposition and multiple fistulogram's most recent 03/17/24. Plan is for new access. His vein mapping in the left arm shows small surface veins so likely would require graft with catheter placement. Revision surgery scheduled for Left AVG and TDC by Dr Gretta but now postponed  - RHC noted severe pulmonary hypertension, reduced cardiac output,'s SVR severely elevated at> 2400, LHC with severe multivessel CAD  Subjective: -seen at HD, Nausea this am, somewhat chronic for him  Assessment and Plan:  Acute on chronic systolic heart failure (HCC) Severe pulmonary hypertension Valvular heart disease, severe AS, moderate MR, severe TR -echo 04/2024 with new systolic findings of EF of 30-35%.  RV preserved moderate to severe MAC with mild to moderate MR, severe TR, low-flow low gradient moderate to severe aortic stenosis LHC 8/12 with findings of severe multivessel CAD with occluded prior stents in the LAD and left circumflex, RHC RA 22, PA 109/56, wedge 35, cardiac index 1.6, SVR greater than 2400. Decompensated ischemic CM. Severe pulmonary HTN - Volume management with HD -CHF team following, Entresto  dose increased - Plan for repeat RHC on Monday  Complication of AV dialysis fistula -He has persistent pseudoaneurysm, low access flow to his right arm AV fistula.  He has had revision with previous interposition and multiple fistulogram's most recent 03/17/24.  -Plan is for new access.  -His vein mapping in the left arm shows small surface veins so likely would require graft with catheter placement. -Revision surgery scheduled for 05/30/24 by Dr Gretta postponed.   Prolonged QT interval Repeat EKG today  Essential hypertension Meds as above  ESRD on dialysis [facility]) hyperkalemia Should correct with HD today  CAD (coronary artery disease) -LHC 8/12: Severe multivessel CAD with occluded prior stents in the LAD and left circumflex Territory supplied by these vessels is largely infarcted based on stress test findings.  Therefore, I do not think he will benefit from revascularization to these vessels. -continue medical management  -statin intolerant  PAD (peripheral artery disease) (HCC) Left BKA, right TMA Continue ASA  Statin intolerant   Dyslipidemia Statin intolerant   Type 2 diabetes mellitus (HCC) A1c is 7.7 CBGs elevated, increase  Iron  deficiency anemia, unspecified Cbc pending  Baseline hgb around 10    DVT prophylaxis: Hep SQ Code Status: Full Code Family Communication:none present Disposition Plan:   Consultants:    Procedures:   Antimicrobials:    Objective: Vitals:   06/01/24 1100 06/01/24 1130 06/01/24 1150 06/01/24 1155  BP: 135/67 137/78 (!) 141/87 (!) 142/80  Pulse: 66 65 61 63  Resp: 15 11 (!) 21 19  Temp:   98.4 F (36.9 C) 98.4 F (36.9 C)  TempSrc:      SpO2: 99% 100% 100% 99%  Weight:   72.2 kg 72.2 kg  Height:        Intake/Output Summary (Last 24 hours) at  06/01/2024 1446 Last data filed at 06/01/2024 1155 Gross per 24 hour  Intake 900 ml  Output 3000 ml  Net -2100 ml   Filed Weights   06/01/24 0800 06/01/24 1150 06/01/24 1155  Weight: 75.2 kg 72.2 kg 72.2 kg    Examination:  General exam: Chronically ill-appearing HEENT: Positive JVD Respiratory system: Clear to  auscultation Cardiovascular system: S1 & S2 heard, RRR.  Systolic murmur Abd: nondistended, soft and nontender.Normal bowel sounds heard. Central nervous system: Alert and oriented. No focal neurological deficits. Extremities: Left BKA, right TMA Skin: No rashes Psychiatry: flat affect    Data Reviewed:   CBC: Recent Labs  Lab 05/26/24 1246 05/31/24 1343 05/31/24 1729  WBC 3.8  --  3.6*  NEUTROABS  --   --  3.2  HGB 10.9* 10.5*  10.5* 10.5*  HCT 34.0* 31.0*  31.0* 32.8*  MCV 87  --  86.1  PLT 107*  --  80*   Basic Metabolic Panel: Recent Labs  Lab 05/26/24 1246 05/31/24 1343 05/31/24 1729 06/01/24 0221  NA 137 136  136 134* 134*  K 5.3* 5.5*  5.5* 6.2* 6.0*  CL 93*  --  98 100  CO2 19*  --  20* 16*  GLUCOSE 134*  --  206* 251*  BUN 50*  --  43* 51*  CREATININE 9.68*  --  8.89* 9.44*  CALCIUM  9.8  --  9.4 9.2  MG  --   --  2.5*  --    GFR: Estimated Creatinine Clearance: 8.7 mL/min (A) (by C-G formula based on SCr of 9.44 mg/dL (H)). Liver Function Tests: Recent Labs  Lab 05/31/24 1729 06/01/24 0221  AST 23 16  ALT 15 13  ALKPHOS 102 91  BILITOT 1.1 1.5*  PROT 6.9 6.3*  ALBUMIN  3.9 3.7   No results for input(s): LIPASE, AMYLASE in the last 168 hours. No results for input(s): AMMONIA in the last 168 hours. Coagulation Profile: No results for input(s): INR, PROTIME in the last 168 hours. Cardiac Enzymes: No results for input(s): CKTOTAL, CKMB, CKMBINDEX, TROPONINI in the last 168 hours. BNP (last 3 results) No results for input(s): PROBNP in the last 8760 hours. HbA1C: Recent Labs    05/31/24 1729  HGBA1C 7.7*   CBG: Recent Labs  Lab 05/31/24 1652 05/31/24 2056 06/01/24 0602  GLUCAP 188* 300* 229*   Lipid Profile: No results for input(s): CHOL, HDL, LDLCALC, TRIG, CHOLHDL, LDLDIRECT in the last 72 hours. Thyroid  Function Tests: No results for input(s): TSH, T4TOTAL, FREET4, T3FREE, THYROIDAB in  the last 72 hours. Anemia Panel: No results for input(s): VITAMINB12, FOLATE, FERRITIN, TIBC, IRON , RETICCTPCT in the last 72 hours. Urine analysis:    Component Value Date/Time   COLORURINE YELLOW 06/06/2011 2300   APPEARANCEUR CLOUDY (A) 06/06/2011 2300   LABSPEC 1.021 06/06/2011 2300   PHURINE 5.0 06/06/2011 2300   GLUCOSEU NEGATIVE 06/06/2011 2300   GLUCOSEU > or = 1000 mg/dL (AA) 92/71/7990 8784   HGBUR SMALL (A) 06/06/2011 2300   HGBUR moderate 11/01/2007 0931   BILIRUBINUR NEGATIVE 06/06/2011 2300   KETONESUR NEGATIVE 06/06/2011 2300   PROTEINUR >300 (A) 06/06/2011 2300   UROBILINOGEN 0.2 06/06/2011 2300   NITRITE NEGATIVE 06/06/2011 2300   LEUKOCYTESUR SMALL (A) 06/06/2011 2300   Sepsis Labs: @LABRCNTIP (procalcitonin:4,lacticidven:4)  )No results found for this or any previous visit (from the past 240 hours).   Radiology Studies: CARDIAC CATHETERIZATION Result Date: 05/31/2024 Images from the original result were not included. Coronary angiography 05/31/2024: LM:  No significant disease LAD: Occluded mid LAD stent        Faint collaterals filling distal LAD        Diag 2 with 80% stenosis Ramus: Small caliber vessel, prox 90% stenosis Lcx: Prox 70%, mid 80% stenoses. Occluded OM 3 stent RCA: Large, dominant vessel          Mid focal 60% stenosis         LVEDP 22 mmHg Right heart catheterization 05/31/2024: RA: 22 mmHg RV: 105/10 mmHg PA: 109/56 mmHg, mPAP 81 mmHg PCW: 35 mmHg AO sats: 96% PA sats: 41% Fick: CO: 3.1 L/min CI: 1.6 L/min/m2 Thermodilution: CO: 3.0 L/min CI: 1.5 L/min/m2 Conclusion: Severe multivessel CAD with occluded prior stents in the LAD and left circumflex Territory supplied by these vessels is largely infarcted based on stress test findings.  Therefore, I do not think he will benefit from revascularization to these vessels. 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 Recommend medical admission, with heart failure team input (Dr. Zenaida already aware) [name] JINNY Lawrence, MD     Scheduled Meds:  aspirin  EC  81 mg Oral Daily   heparin   5,000 Units Subcutaneous Q8H   insulin  aspart  0-6 Units Subcutaneous TID WC   ondansetron  (ZOFRAN ) IV  4 mg Intravenous Once   pantoprazole   40 mg Oral Daily   sacubitril -valsartan   1 tablet Oral BID   sevelamer  carbonate  1,600-2,400 mg Oral TID WC   sodium chloride  flush  3 mL Intravenous Q12H   sodium zirconium cyclosilicate   10 g Oral Q4H   traZODone   150 mg Oral QHS   Continuous Infusions:  sodium chloride        LOS: 1 day    Time spent:    Sigurd Pac, MD [facility]   06/01/2024, 2:46 PM

## 2024-06-01 NOTE — Progress Notes (Addendum)
 Advanced Heart Failure Rounding Note  Cardiologist: [name], MD   Chief Complaint: Acute on chronic HFrEF with low-output  Subjective:    Reports intermittent nausea. No dyspnea at rest but gets short of breath when walking short distances with his prosthesis.   Objective:   Weight Range: 79.9 kg Body mass index is 25.27 kg/m.   Vital Signs:   Temp:  [97.9 F (36.6 C)-98.6 F (37 C)] 98.1 F (36.7 C) (08/13 0406) Pulse Rate:  [0-100] 60 (08/13 0406) Resp:  [11-24] 18 (08/13 0406) BP: (135-203)/(81-143) 140/81 (08/13 0406) SpO2:  [91 %-100 %] 96 % (08/13 0406) Weight:  [74.8 kg-79.9 kg] 79.9 kg (08/13 0406)    Weight change: Filed Weights   05/31/24 1044 05/31/24 1700 06/01/24 0406  Weight: 74.8 kg 77.7 kg 79.9 kg    Intake/Output:   Intake/Output Summary (Last 24 hours) at 06/01/2024 0708 Last data filed at 05/31/2024 1835 Gross per 24 hour  Intake 420 ml  Output --  Net 420 ml      Physical Exam    General:  Chronically ill appearing. No distress.  Neck: JVP to jaw Cor: Regular rate & rhythm. 2/6 systolic ejection murmur Lungs: Clear Abdomen: Soft, nontender, nondistended.  Extremities: L BKA, R TMA, no edema Neuro: Alert & orientedx3. Affect pleasant   Telemetry   SR 60s  Labs    CBC Recent Labs    05/31/24 1343 05/31/24 1729  WBC  --  3.6*  NEUTROABS  --  3.2  HGB 10.5*  10.5* 10.5*  HCT 31.0*  31.0* 32.8*  MCV  --  86.1  PLT  --  80*   Basic Metabolic Panel Recent Labs    91/87/74 1729 06/01/24 0221  NA 134* 134*  K 6.2* 6.0*  CL 98 100  CO2 20* 16*  GLUCOSE 206* 251*  BUN 43* 51*  CREATININE 8.89* 9.44*  CALCIUM  9.4 9.2  MG 2.5*  --    Liver Function Tests Recent Labs    05/31/24 1729 06/01/24 0221  AST 23 16  ALT 15 13  ALKPHOS 102 91  BILITOT 1.1 1.5*  PROT 6.9 6.3*  ALBUMIN  3.9 3.7   No results for input(s): LIPASE, AMYLASE in the last 72 hours. Cardiac Enzymes No results for  input(s): CKTOTAL, CKMB, CKMBINDEX, TROPONINI in the last 72 hours.  BNP: BNP (last 3 results) No results for input(s): BNP in the last 8760 hours.  ProBNP (last 3 results) No results for input(s): PROBNP in the last 8760 hours.   D-Dimer No results for input(s): DDIMER in the last 72 hours. Hemoglobin A1C Recent Labs    05/31/24 1729  HGBA1C 7.7*   Fasting Lipid Panel No results for input(s): CHOL, HDL, LDLCALC, TRIG, CHOLHDL, LDLDIRECT in the last 72 hours. Thyroid  Function Tests No results for input(s): TSH, T4TOTAL, T3FREE, THYROIDAB in the last 72 hours.  Invalid input(s): FREET3  Other results:   Imaging    CARDIAC CATHETERIZATION Result Date: 05/31/2024 Images from the original result were not included. Coronary angiography 05/31/2024: LM: No significant disease LAD: Occluded mid LAD stent        Faint collaterals filling distal LAD        Diag 2 with 80% stenosis Ramus: Small caliber vessel, prox 90% stenosis Lcx: Prox 70%, mid 80% stenoses. Occluded OM 3 stent RCA: Large, dominant vessel          Mid focal 60% stenosis  LVEDP 22 mmHg Right heart catheterization 05/31/2024: RA: 22 mmHg RV: 105/10 mmHg PA: 109/56 mmHg, mPAP 81 mmHg PCW: 35 mmHg AO sats: 96% PA sats: 41% Fick: CO: 3.1 L/min CI: 1.6 L/min/m2 Thermodilution: CO: 3.0 L/min CI: 1.5 L/min/m2 Conclusion: Severe multivessel CAD with occluded prior stents in the LAD and left circumflex Territory supplied by these vessels is largely infarcted based on stress test findings.  Therefore, I do not think he will benefit from revascularization to these vessels. 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 Recommend medical admission, with heart failure team input (Dr. Zenaida already aware) [name], MD     Medications:     Scheduled Medications:  aspirin  EC  81 mg  Oral Daily   heparin   5,000 Units Subcutaneous Q8H   insulin  aspart  0-6 Units Subcutaneous TID WC   pantoprazole   40 mg Oral Daily   sacubitril -valsartan   1 tablet Oral BID   sevelamer  carbonate  1,600-2,400 mg Oral TID WC   sodium chloride  flush  3 mL Intravenous Q12H   sodium zirconium cyclosilicate   10 g Oral Q4H   traZODone   150 mg Oral QHS    Infusions:  sodium chloride      calcium  gluconate 1,000 mg (06/01/24 0609)    PRN Medications: sodium chloride , acetaminophen  **OR** acetaminophen , hydrALAZINE , hydrOXYzine , sodium chloride  flush    Patient Profile   58 y.o. 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   Presented for elective R/LHC as part of pre-op  evaluation for hernia surgery and fistula revision. He was admitted for optimization given low-output HF, volume overload and severe pulmonary hypertension.  Assessment/Plan   Acute on chronic HFrEF -Ischemic cardiomyopathy -Echo 05/17/24: LVEF 30-35%, RV okay, moderate to severe MAC with mild to moderate MR, severe TR, low flow low gradient moderate to severe AS with AVA 0.96 cm2 by VTI and mean gradient 10 mmHg, indexed AVA 0.51 cm2/m2 -Hx multivessel PCI. LHC 05/31/24 w/ multivessel CAD including occluded stents in LAD and LCX treated medically. RHC: RA 22, PA 109/56 (81), PCWP 35, Fick CI 1.6, TD CI 1.5, PVR 11, SVR > 2400. -No beta blocker with low-output -BP remains elevated. Increasing entresto  to 49/51 mg BID. May need to hold on am of HD days if BP drops during sessions. -Would favor performing vascular procedures while inpatient. Will ask [facility] to weigh in on plans for HD access. Plan repeat RHC on Friday, 08/15 to reassess hemodynamics and PA pressures.  Informed Consent   Shared Decision Making/Informed Consent The risks, including but not limited to, [bleeding or vascular complications (1 in 500), pneumothorax (1 in 1600), arrhythmia (1 in 1000)  and death (1 in 5000)], benefits (diagnostic support and/or management of heart failure, pulmonary hypertension) and alternatives of a right heart catheterization were discussed in detail with Mr. Rakestraw and he is willing to proceed.      CAD -Hx prior PCIs. LHC 08/25 with multivessel CAD managed medically as above. No targets for PCI and poor CABG candidate. -Continue aspirin  81 mg daily -Intolerant to statins (rosuvastatin and atorvastatin ) as well as zetia   ESRD -volume managed with HD. -Nephrology consulted.  -He declined additional HD session 08/12. UF has been limited by cramping and BP. Reports more trouble with HD the last few weeks.  HTN -BP remains elevated -Meds as above  Hyperkalemia -  K is 6.  -Receiving lokelma  -HD today  Valvular heart disease - Last echo 07/25: EF reduced as above, moderate to severe MAC with mild to moderate MR, severe TR, low flow low gradient moderate to severe AS with AVA 0.96 cm2 by VTI and mean gradient 10 mmHg, indexed AVA 0.51 cm2/m2  PAD -S/p L BKA and R TMA  Length of Stay: 1  FINCH, LINDSAY N, PA-C  06/01/2024, 7:08 AM  Advanced Heart Failure Team Pager [phone] (M-F; 7a - 5p)  Please contact [facility] for night-coverage after hours (5p -7a ) and weekends on [email or web address]    Patient seen and examined with the above-signed Advanced Practice Provider and/or Housestaff. I personally reviewed laboratory data, imaging studies and relevant notes. I independently examined the patient and formulated the important aspects of the plan. I have edited the note to reflect any of my changes or salient points. I have personally discussed the plan with the patient and/or family.  Tolerated HD well today. BP remains high. Denies CP or SOB. Fistula with low flow.   General:  Sitting up No resp difficulty HEENT: normal Neck: supple. JVP  10 Carotids 2+ bilat; no bruits. No lymphadenopathy or thryomegaly appreciated. Cor: PMI nondisplaced. Regular  rate & rhythm. 3/6 AS Lungs: clear Abdomen: soft, nontender, nondistended. No hepatosplenomegaly. No bruits or masses. Good bowel sounds. Extremities: no cyanosis, clubbing, rash, edema  s/p L BKA  + RUE AVF Neuro: alert & orientedx3, cranial nerves grossly intact. moves all 4 extremities w/o difficulty. Affect pleasant  Difficult situation. 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 AS likely in moderate range.   Will continue to try to optimize with volume removal and GDMT titration. Plan repeat RHC tomorrow. Low output and lack of high-flow through AVF argue against that AVF is responsible for PAH.   Not candidate for hernia surgery currently. Continue to manage medically.   Toribio Fuel, MD  8:27 AM

## 2024-06-01 NOTE — Progress Notes (Signed)
 Pt receives Op HD at [facility] [place], [place], 9454 chair time. Will continue to assist as needed.   Deshawn Skelley Dialysis Navigator [phone]

## 2024-06-01 NOTE — TOC CM/SW Note (Signed)
 Transition of Care [facility]) - Inpatient Brief Assessment   Patient Details  Name: [name] MRN: [id number] Date of Birth: 1966-06-22  [facility]) CM/SW Contact:    Waddell Barnie Rama, RN Phone Number: 06/01/2024, 1:09 PM   Clinical Narrative: From home with spouse, has PCP and insurance on file, states has no HH services in place at this time, has  w/chair, chair lift, ramp, bathroom grab bars at home.  States family member (wife)  will transport them home at [employer] and family is support system, states gets medications from [facility].  Pta self ambulatory with Left Prosthetic.   There are no ICM needs identified  at this time.  Please place consult for ICM needs if any needs arise.     Transition of Care Asessment: Insurance and Status: Insurance coverage has been reviewed Patient has primary care physician: Yes Home environment has been reviewed: home with wife Prior level of function:: ambulatory wiht prosthetic L leg, has w/chair also Prior/Current Home Services: Current home services (w/chair, chair lift, ramp, bathroom grab bars) Social Drivers of Health Review: SDOH reviewed no interventions necessary Readmission risk has been reviewed: Yes Transition of care needs: no transition of care needs at this time

## 2024-06-01 NOTE — Progress Notes (Signed)
   06/01/24 1155  Vitals  Temp 98.4 F (36.9 C)  Pulse Rate 63  Resp 19  BP (!) 142/80  SpO2 99 %  Weight 72.2 kg  Type of Weight Post-Dialysis  Post Treatment  Dialyzer Clearance Lightly streaked  Hemodialysis Intake (mL) 0 mL  Liters Processed 83.9  Fluid Removed (mL) 3000 mL  Tolerated HD Treatment Yes  AVG/AVF Arterial Site Held (minutes) 10 minutes  AVG/AVF Venous Site Held (minutes) 10 minutes   Received patient in bed to unit.  Alert and oriented.  Informed consent signed and in chart.   TX duration:3.5hrs  Patient tolerated well.  Transported back to the room  Alert, without acute distress.  Hand-off given to patient's nurse.   Access used: RAVF Access issues: NONE  Total UF removed: 3L Medication(s) given: IMMODIUM   [facility]

## 2024-06-01 NOTE — Plan of Care (Signed)
  Problem: Education: Goal: Understanding of CV disease, CV risk reduction, and recovery process will improve Outcome: Progressing Goal: Individualized Educational Video(s) Outcome: Progressing   Problem: Activity: Goal: Ability to return to baseline activity level will improve Outcome: Progressing   Problem: Cardiovascular: Goal: Ability to achieve and maintain adequate cardiovascular perfusion will improve Outcome: Progressing Goal: Vascular access site(s) Level 0-1 will be maintained Outcome: Progressing   Problem: Health Behavior/Discharge Planning: Goal: Ability to safely manage health-related needs after discharge will improve Outcome: Progressing   Problem: Education: Goal: Knowledge of General Education information will improve Description: Including pain rating scale, medication(s)/side effects and non-pharmacologic comfort measures Outcome: Progressing   Problem: Health Behavior/Discharge Planning: Goal: Ability to manage health-related needs will improve Outcome: Progressing   Problem: Clinical Measurements: Goal: Ability to maintain clinical measurements within normal limits will improve Outcome: Progressing Goal: Will remain free from infection Outcome: Progressing Goal: Diagnostic test results will improve Outcome: Progressing Goal: Respiratory complications will improve Outcome: Progressing Goal: Cardiovascular complication will be avoided Outcome: Progressing   Problem: Activity: Goal: Risk for activity intolerance will decrease Outcome: Progressing   Problem: Nutrition: Goal: Adequate nutrition will be maintained Outcome: Progressing   Problem: Coping: Goal: Level of anxiety will decrease Outcome: Progressing   Problem: Elimination: Goal: Will not experience complications related to bowel motility Outcome: Progressing Goal: Will not experience complications related to urinary retention Outcome: Progressing   Problem: Pain Managment: Goal:  General experience of comfort will improve and/or be controlled Outcome: Progressing   Problem: Safety: Goal: Ability to remain free from injury will improve Outcome: Progressing   Problem: Skin Integrity: Goal: Risk for impaired skin integrity will decrease Outcome: Progressing   Problem: Education: Goal: Ability to describe self-care measures that may prevent or decrease complications (Diabetes Survival Skills Education) will improve Outcome: Progressing Goal: Individualized Educational Video(s) Outcome: Progressing   Problem: Coping: Goal: Ability to adjust to condition or change in health will improve Outcome: Progressing   Problem: Fluid Volume: Goal: Ability to maintain a balanced intake and output will improve Outcome: Progressing   Problem: Health Behavior/Discharge Planning: Goal: Ability to identify and utilize available resources and services will improve Outcome: Progressing Goal: Ability to manage health-related needs will improve Outcome: Progressing   Problem: Metabolic: Goal: Ability to maintain appropriate glucose levels will improve Outcome: Progressing   Problem: Nutritional: Goal: Maintenance of adequate nutrition will improve Outcome: Progressing Goal: Progress toward achieving an optimal weight will improve Outcome: Progressing   Problem: Skin Integrity: Goal: Risk for impaired skin integrity will decrease Outcome: Progressing   Problem: Tissue Perfusion: Goal: Adequacy of tissue perfusion will improve Outcome: Progressing

## 2024-06-01 NOTE — Plan of Care (Signed)
  Problem: Activity: Goal: Ability to return to baseline activity level will improve Outcome: Progressing   Problem: Education: Goal: Knowledge of General Education information will improve Description: Including pain rating scale, medication(s)/side effects and non-pharmacologic comfort measures Outcome: Progressing   Problem: Activity: Goal: Risk for activity intolerance will decrease Outcome: Progressing   Problem: Coping: Goal: Level of anxiety will decrease Outcome: Progressing   Problem: Safety: Goal: Ability to remain free from injury will improve Outcome: Progressing

## 2024-06-01 NOTE — Procedures (Signed)
 I was present at this dialysis session. I have reviewed the session itself and made appropriate changes.   Vital signs in last 24 hours:  Temp:  [97.7 F (36.5 C)-98.6 F (37 C)] 97.7 F (36.5 C) (08/13 0800) Pulse Rate:  [0-100] 60 (08/13 0815) Resp:  [11-24] 17 (08/13 0815) BP: (124-203)/(77-143) 132/79 (08/13 0815) SpO2:  [91 %-100 %] 100 % (08/13 0815) Weight:  [74.8 kg-79.9 kg] 75.2 kg (08/13 0800) Weight change:  Filed Weights   05/31/24 1700 06/01/24 0406 06/01/24 0800  Weight: 77.7 kg 79.9 kg 75.2 kg    Recent Labs  Lab 06/01/24 0221  NA 134*  K 6.0*  CL 100  CO2 16*  GLUCOSE 251*  BUN 51*  CREATININE 9.44*  CALCIUM  9.2    Recent Labs  Lab 05/26/24 1246 05/31/24 1343 05/31/24 1729  WBC 3.8  --  3.6*  NEUTROABS  --   --  3.2  HGB 10.9* 10.5*  10.5* 10.5*  HCT 34.0* 31.0*  31.0* 32.8*  MCV 87  --  86.1  PLT 107*  --  80*    Scheduled Meds:  aspirin  EC  81 mg Oral Daily   heparin   5,000 Units Subcutaneous Q8H   insulin  aspart  0-6 Units Subcutaneous TID WC   pantoprazole   40 mg Oral Daily   sacubitril -valsartan   1 tablet Oral BID   sevelamer  carbonate  1,600-2,400 mg Oral TID WC   sodium chloride  flush  3 mL Intravenous Q12H   sodium zirconium cyclosilicate   10 g Oral Q4H   traZODone   150 mg Oral QHS   Continuous Infusions:  sodium chloride      PRN Meds:.sodium chloride , acetaminophen  **OR** acetaminophen , hydrALAZINE , hydrOXYzine , sodium chloride  flush   [name],  MD 06/01/2024, 8:23 AM

## 2024-06-02 DIAGNOSIS — I5023 Acute on chronic systolic (congestive) heart failure: Secondary | ICD-10-CM | POA: Diagnosis not present

## 2024-06-02 LAB — BASIC METABOLIC PANEL WITH GFR
Anion gap: 15 (ref 5–15)
BUN: 42 mg/dL — ABNORMAL HIGH (ref 6–20)
CO2: 24 mmol/L (ref 22–32)
Calcium: 8.9 mg/dL (ref 8.9–10.3)
Chloride: 94 mmol/L — ABNORMAL LOW (ref 98–111)
Creatinine, Ser: 7.64 mg/dL — ABNORMAL HIGH (ref 0.61–1.24)
GFR, Estimated: 8 mL/min — ABNORMAL LOW (ref >60)
Glucose, Bld: 234 mg/dL — ABNORMAL HIGH (ref 70–99)
Potassium: 4 mmol/L (ref 3.5–5.1)
Sodium: 133 mmol/L — ABNORMAL LOW (ref 135–145)

## 2024-06-02 LAB — GLUCOSE, CAPILLARY
Glucose-Capillary: 217 mg/dL — ABNORMAL HIGH (ref 70–99)
Glucose-Capillary: 247 mg/dL — ABNORMAL HIGH (ref 70–99)
Glucose-Capillary: 178 mg/dL — ABNORMAL HIGH (ref 70–99)

## 2024-06-02 LAB — LIPOPROTEIN A (LPA): Lipoprotein (a): 61.6 nmol/L — ABNORMAL HIGH (ref <75.0)

## 2024-06-02 LAB — CBC
HCT: 33 % — ABNORMAL LOW (ref 39.0–52.0)
Hemoglobin: 11 g/dL — ABNORMAL LOW (ref 13.0–17.0)
MCH: 28.2 pg (ref 26.0–34.0)
MCHC: 33.3 g/dL (ref 30.0–36.0)
MCV: 84.6 fL (ref 80.0–100.0)
Platelets: 154 K/uL (ref 150–400)
RBC: 3.9 MIL/uL — ABNORMAL LOW (ref 4.22–5.81)
RDW: 14.4 % (ref 11.5–15.5)
WBC: 2.8 K/uL — ABNORMAL LOW (ref 4.0–10.5)
nRBC: 0 % (ref 0.0–0.2)

## 2024-06-02 MED ORDER — ONDANSETRON HCL 4 MG/2ML IJ SOLN
4.0000 mg | Freq: Once | INTRAMUSCULAR | Status: AC
  Administered 2024-06-02: 4 mg via INTRAVENOUS
  Filled 2024-06-02: qty 2

## 2024-06-02 MED ORDER — INSULIN GLARGINE-YFGN 100 UNIT/ML ~~LOC~~ SOLN
10.0000 [IU] | Freq: Every day | SUBCUTANEOUS | Status: DC
  Administered 2024-06-04 – 2024-06-07 (×4): 10 [IU] via SUBCUTANEOUS
  Filled 2024-06-02 (×7): qty 0.1

## 2024-06-02 MED ORDER — SODIUM CHLORIDE 0.9% FLUSH
3.0000 mL | Freq: Two times a day (BID) | INTRAVENOUS | Status: DC
  Administered 2024-06-02 (×2): 3 mL via INTRAVENOUS

## 2024-06-02 MED ORDER — SODIUM CHLORIDE 0.9% FLUSH: 3.0000 mL | INTRAVENOUS | Status: DC | PRN

## 2024-06-02 MED ORDER — ALUM & MAG HYDROXIDE-SIMETH 200-200-20 MG/5ML PO SUSP
15.0000 mL | Freq: Once | ORAL | Status: AC
  Administered 2024-06-02: 15 mL via ORAL
  Filled 2024-06-02: qty 30

## 2024-06-02 MED ORDER — SODIUM CHLORIDE 0.9 % IV SOLN: 250.0000 mL | INTRAVENOUS | Status: DC | PRN

## 2024-06-02 NOTE — Progress Notes (Signed)
 Patient refusing all insulin , education provided. Verbalized understanding

## 2024-06-02 NOTE — Inpatient Diabetes Management (Signed)
 Inpatient Diabetes Program Recommendations  AACE/ADA: New Consensus Statement on Inpatient Glycemic Control   Target Ranges:  Prepandial:   less than 140 mg/dL      Peak postprandial:   less than 180 mg/dL (1-2 hours)      Critically ill patients:  140 - 180 mg/dL    Latest Reference Range & Units 05/31/24 16:52 05/31/24 20:56 06/01/24 06:02 06/01/24 16:13 06/01/24 21:00 06/02/24 06:41  Glucose-Capillary 70 - 99 mg/dL 811 (H) 699 (H) 770 (H) 204 (H) 249 (H) 217 (H)    Latest Reference Range & Units 05/31/24 17:29  Hemoglobin A1C 4.8 - 5.6 % 7.7 (H)   Review of Glycemic Control  Diabetes history: DM2 Outpatient Diabetes medications: None Current orders for Inpatient glycemic control: Semglee  10 units daily, Novolog  2 units TID with meals, Novolog  0-6 units TID with meals  Inpatient Diabetes Program Recommendations:    Insulin : No insulin  has been given since arrival. Per chart, patient is refusing insulin .  May want to consider ordering oral DM medication if patient is willing to take it.   Thanks, Earnie Gainer, RN, MSN, CDCES Diabetes Coordinator Inpatient Diabetes Program [phone] (Team Pager from 8am to 5pm)

## 2024-06-02 NOTE — Progress Notes (Addendum)
 Advanced Heart Failure Rounding Note  Cardiologist: [name] JINNY Lawrence, MD   Chief Complaint: Acute on chronic HFrEF with low-output  Subjective:    Sitting at sink doing ADLs. Feels good this morning. He had iHD yesterday -3L removed. Denies CP/SOB. He is able to get around with his prosthesis.   Objective:   Weight Range: 73.8 kg Body mass index is 23.34 kg/m.   Vital Signs:   Temp:  [97.6 F (36.4 C)-98.4 F (36.9 C)] 97.8 F (36.6 C) (08/14 0423) Pulse Rate:  [55-67] 63 (08/14 0423) Resp:  [11-21] 18 (08/14 0423) BP: (121-149)/(57-100) 121/83 (08/14 0423) SpO2:  [97 %-100 %] 98 % (08/14 0423) Weight:  [72.2 kg-75.2 kg] 73.8 kg (08/14 0423) Last BM Date : 06/01/24  Weight change: Filed Weights   06/01/24 1150 06/01/24 1155 06/02/24 0423  Weight: 72.2 kg 72.2 kg 73.8 kg    Intake/Output:   Intake/Output Summary (Last 24 hours) at 06/02/2024 0738 Last data filed at 06/01/2024 2100 Gross per 24 hour  Intake 480 ml  Output 3000 ml  Net -2520 ml      Physical Exam    General:  chronically ill appearing.  No respiratory difficulty Neck: JVD ~7 cm.  Cor: Regular rate & rhythm. 2/6 systolic murmur. Lungs: clear, diminished bases Extremities: L BKA, R TMA, no edema Neuro: alert & oriented x 3. Affect pleasant.   Telemetry   SR 60s  Labs    CBC Recent Labs    05/31/24 1729 06/02/24 0320  WBC 3.6* 2.8*  NEUTROABS 3.2  --   HGB 10.5* 11.0*  HCT 32.8* 33.0*  MCV 86.1 84.6  PLT 80* 154   Basic Metabolic Panel Recent Labs    91/87/74 1729 06/01/24 0221 06/02/24 0320  NA 134* 134* 133*  K 6.2* 6.0* 4.0  CL 98 100 94*  CO2 20* 16* 24  GLUCOSE 206* 251* 234*  BUN 43* 51* 42*  CREATININE 8.89* 9.44* 7.64*  CALCIUM  9.4 9.2 8.9  MG 2.5*  --   --    Liver Function Tests Recent Labs    05/31/24 1729 06/01/24 0221  AST 23 16  ALT 15 13  ALKPHOS 102 91  BILITOT 1.1 1.5*  PROT 6.9 6.3*  ALBUMIN  3.9 3.7   No results for input(s):  LIPASE, AMYLASE in the last 72 hours. Cardiac Enzymes No results for input(s): CKTOTAL, CKMB, CKMBINDEX, TROPONINI in the last 72 hours.  BNP: BNP (last 3 results) No results for input(s): BNP in the last 8760 hours.  ProBNP (last 3 results) No results for input(s): PROBNP in the last 8760 hours.   D-Dimer No results for input(s): DDIMER in the last 72 hours. Hemoglobin A1C Recent Labs    05/31/24 1729  HGBA1C 7.7*   Fasting Lipid Panel No results for input(s): CHOL, HDL, LDLCALC, TRIG, CHOLHDL, LDLDIRECT in the last 72 hours. Thyroid  Function Tests No results for input(s): TSH, T4TOTAL, T3FREE, THYROIDAB in the last 72 hours.  Invalid input(s): FREET3  Other results:   Imaging    No results found.    Medications:     Scheduled Medications:  aspirin  EC  81 mg Oral Daily   heparin   5,000 Units Subcutaneous Q8H   insulin  aspart  0-6 Units Subcutaneous TID WC   insulin  aspart  2 Units Subcutaneous TID WC   ondansetron  (ZOFRAN ) IV  4 mg Intravenous Once   pantoprazole   40 mg Oral Daily   sacubitril -valsartan   1 tablet Oral BID  sevelamer  carbonate  1,600-2,400 mg Oral TID WC   sodium chloride  flush  3 mL Intravenous Q12H   traZODone   150 mg Oral QHS    Infusions:    PRN Medications: acetaminophen  **OR** acetaminophen , hydrALAZINE , hydrOXYzine , loperamide , sodium chloride  flush    Patient Profile   58 y.o. 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   Presented for elective R/LHC as part of pre-op  evaluation for hernia surgery and fistula revision. He was admitted for optimization given low-output HF, volume overload and severe pulmonary hypertension.  Assessment/Plan  Acute on chronic HFrEF -Ischemic cardiomyopathy -Echo 05/17/24: LVEF 30-35%, RV okay, moderate to severe MAC with mild to moderate MR, severe TR, low flow low gradient moderate to  severe AS with AVA 0.96 cm2 by VTI and mean gradient 10 mmHg, indexed AVA 0.51 cm2/m2 -Hx multivessel PCI. LHC 05/31/24 w/ multivessel CAD including occluded stents in LAD and LCX treated medically. RHC: RA 22, PA 109/56 (81), PCWP 35, Fick CI 1.6, TD CI 1.5, PVR 11, SVR > 2400. -No beta blocker with low-output -BP improving. Continue entresto  49/51 mg BID. May need to hold on am of HD days if BP drops during sessions. -Would favor performing vascular procedures while inpatient. [facility] consulted, they saw yesterday. Plan to re-consult if aVF dysfunctioning. Plan repeat RHC on Friday, 08/15 to reassess hemodynamics and PA pressures.  Informed Consent   Shared Decision Making/Informed Consent The risks, including but not limited to, [bleeding or vascular complications (1 in 500), pneumothorax (1 in 1600), arrhythmia (1 in 1000) and death (1 in 5000)], benefits (diagnostic support and/or management of heart failure, pulmonary hypertension) and alternatives of a right heart catheterization were discussed in detail with [name] and he is willing to proceed.      CAD -Hx prior PCIs. LHC 08/25 with multivessel CAD managed medically as above. No targets for PCI and poor CABG candidate. -Continue aspirin  81 mg daily -Intolerant to statins (rosuvastatin and atorvastatin ) as well as zetia   ESRD -volume managed with HD. -Nephrology following.  -He declined additional HD session 08/12. UF has been limited by cramping and BP. Reports more trouble with HD the last few weeks. - Tolerated iHD yesterday. -3L removed  HTN -BP improving post iHD and increased Entresto  dose.  -Meds as above  Hyperkalemia -resolved with iHD  Valvular heart disease - Last echo 07/25: EF reduced as above, moderate to severe MAC with mild to moderate MR, severe TR, low flow low gradient moderate to severe AS with AVA 0.96 cm2 by VTI and mean gradient 10 mmHg, indexed AVA 0.51 cm2/m2  PAD -S/p L BKA and R TMA  Length of Stay:  2  [name] LITTIE Coe, NP  06/02/2024, 7:38 AM  Advanced Heart Failure Team Pager [phone] (M-F; 7a - 5p)  Please contact [facility] for night-coverage after hours (5p -7a ) and weekends on [email or web address]    Patient seen and examined with the above-signed Advanced Practice Provider and/or Housestaff. I personally reviewed laboratory data, imaging studies and relevant notes. I independently examined the patient and formulated the important aspects of the plan. I have edited the note to reflect any of my changes or salient points. I have personally discussed the plan with the patient and/or family.  Feels ok. Denies CP or SOB. Has questions about cath. Planning for HD today  General:  Sitting up No resp difficulty HEENT: normal Neck: supple. JVP jaw  Cor:  Regular rate & rhythm. 2/6 AS Lungs: clear Abdomen: soft, nontender, nondistended. No hepatosplenomegaly. No bruits or masses. Good bowel sounds. Extremities: no cyanosis, clubbing, rash, edema  s/p BKA RUE AVF Neuro: alert & orientedx3, cranial nerves grossly intact. moves all 4 extremities w/o difficulty. Affect pleasant  HTN improved. Has had fluid removal with HD and will have additional optimization today. Plan repeat RHC tomorrow to reassess hemodynamics and severity of PAH. Echo and cath reviewed. AS not severe.   [name] Fuel, MD  7:56 AM

## 2024-06-02 NOTE — Progress Notes (Signed)
 Patient ID: [name], male   DOB: [birth year], 58 y.o.   MRN: [id number] S: Able to UF 3 liters yesterday with dialysis without incident. O:BP (!) 152/92 (BP Location: Left Arm)   Pulse 69   Temp 97.8 F (36.6 C) (Oral)   Resp 18   Ht 5' 10 (1.778 m)   Wt 73.8 kg   SpO2 97%   BMI 23.34 kg/m   Intake/Output Summary (Last 24 hours) at 06/02/2024 1029 Last data filed at 06/02/2024 1011 Gross per 24 hour  Intake 960 ml  Output 3000 ml  Net -2040 ml   Intake/Output: I/O last 3 completed shifts: In: 960 [P.O.:960] Out: 3000 [Other:3000]  Intake/Output this shift:  Total I/O In: 480 [P.O.:480] Out: -  Weight change: 0.356 kg Gen: NAD CVS: RRR Resp:CTA Abd: +BS, soft, NT/ND Ext: no edema, s/p RTMA, s/p LBKA, RUE AVF +T/B  Recent Labs  Lab 05/26/24 1246 05/31/24 1343 05/31/24 1729 06/01/24 0221 06/02/24 0320  NA 137 136  136 134* 134* 133*  K 5.3* 5.5*  5.5* 6.2* 6.0* 4.0  CL 93*  --  98 100 94*  CO2 19*  --  20* 16* 24  GLUCOSE 134*  --  206* 251* 234*  BUN 50*  --  43* 51* 42*  CREATININE 9.68*  --  8.89* 9.44* 7.64*  ALBUMIN   --   --  3.9 3.7  --   CALCIUM  9.8  --  9.4 9.2 8.9  AST  --   --  23 16  --   ALT  --   --  15 13  --    Liver Function Tests: Recent Labs  Lab 05/31/24 1729 06/01/24 0221  AST 23 16  ALT 15 13  ALKPHOS 102 91  BILITOT 1.1 1.5*  PROT 6.9 6.3*  ALBUMIN  3.9 3.7   No results for input(s): LIPASE, AMYLASE in the last 168 hours. No results for input(s): AMMONIA in the last 168 hours. CBC: Recent Labs  Lab 05/26/24 1246 05/31/24 1343 05/31/24 1729 06/02/24 0320  WBC 3.8  --  3.6* 2.8*  NEUTROABS  --   --  3.2  --   HGB 10.9* 10.5*  10.5* 10.5* 11.0*  HCT 34.0* 31.0*  31.0* 32.8* 33.0*  MCV 87  --  86.1 84.6  PLT 107*  --  80* 154   Cardiac Enzymes: No results for input(s): CKTOTAL, CKMB, CKMBINDEX, TROPONINI in the last 168 hours. CBG: Recent Labs  Lab 05/31/24 2056 06/01/24 0602 06/01/24 1613  06/01/24 2100 06/02/24 0641  GLUCAP 300* 229* 204* 249* 217*    Iron  Studies: No results for input(s): IRON , TIBC, TRANSFERRIN, FERRITIN in the last 72 hours. Studies/Results: CARDIAC CATHETERIZATION Result Date: 05/31/2024 Images from the original result were not included. Coronary angiography 05/31/2024: LM: No significant disease LAD: Occluded mid LAD stent        Faint collaterals filling distal LAD        Diag 2 with 80% stenosis Ramus: Small caliber vessel, prox 90% stenosis Lcx: Prox 70%, mid 80% stenoses. Occluded OM 3 stent RCA: Large, dominant vessel          Mid focal 60% stenosis         LVEDP 22 mmHg Right heart catheterization 05/31/2024: RA: 22 mmHg RV: 105/10 mmHg PA: 109/56 mmHg, mPAP 81 mmHg PCW: 35 mmHg AO sats: 96% PA sats: 41% Fick: CO: 3.1 L/min CI: 1.6 L/min/m2 Thermodilution: CO: 3.0 L/min CI: 1.5 L/min/m2 Conclusion: Severe multivessel CAD  with occluded prior stents in the LAD and left circumflex Territory supplied by these vessels is largely infarcted based on stress test findings.  Therefore, I do not think he will benefit from revascularization to these vessels. 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 Recommend medical admission, with heart failure team input (Dr. Zenaida already aware) [name] JINNY Lawrence, MD    aspirin  EC  81 mg Oral Daily   heparin   5,000 Units Subcutaneous Q8H   insulin  aspart  0-6 Units Subcutaneous TID WC   insulin  aspart  2 Units Subcutaneous TID WC   insulin  glargine-yfgn  10 Units Subcutaneous Daily   ondansetron  (ZOFRAN ) IV  4 mg Intravenous Once   pantoprazole   40 mg Oral Daily   sacubitril -valsartan   1 tablet Oral BID   sevelamer  carbonate  1,600-2,400 mg Oral TID WC   sodium chloride  flush  3 mL Intravenous Q12H   sodium chloride  flush  3 mL Intravenous Q12H   traZODone   150 mg Oral QHS    BMET    Component Value  Date/Time   NA 133 (L) 06/02/2024 0320   NA 137 05/26/2024 1246   K 4.0 06/02/2024 0320   CL 94 (L) 06/02/2024 0320   CO2 24 06/02/2024 0320   GLUCOSE 234 (H) 06/02/2024 0320   BUN 42 (H) 06/02/2024 0320   BUN 50 (H) 05/26/2024 1246   CREATININE 7.64 (H) 06/02/2024 0320   CREATININE 10.48 (H) 06/02/2023 1700   CALCIUM  8.9 06/02/2024 0320   CALCIUM  7.9 (L) 08/31/2009 1524   GFRNONAA 8 (L) 06/02/2024 0320   GFRAA 6 (L) 08/05/2019 0537   CBC    Component Value Date/Time   WBC 2.8 (L) 06/02/2024 0320   RBC 3.90 (L) 06/02/2024 0320   HGB 11.0 (L) 06/02/2024 0320   HGB 10.9 (L) 05/26/2024 1246   HCT 33.0 (L) 06/02/2024 0320   HCT 34.0 (L) 05/26/2024 1246   PLT 154 06/02/2024 0320   PLT 107 (L) 05/26/2024 1246   MCV 84.6 06/02/2024 0320   MCV 87 05/26/2024 1246   MCH 28.2 06/02/2024 0320   MCHC 33.3 06/02/2024 0320   RDW 14.4 06/02/2024 0320   RDW 14.7 05/26/2024 1246   LYMPHSABS 0.3 (L) 05/31/2024 1729   MONOABS 0.1 05/31/2024 1729   EOSABS 0.0 05/31/2024 1729   BASOSABS 0.0 05/31/2024 1729    Dialysis Orders: Center: [facility]   on MWF . EDW 73 kg HD Bath 1K/2.5Ca  Time 4:00 Heparin  none. Access RUE AVF BFR 400 DFR 800    Hectoral 3 mcg IV/HD  Assessment/Plan:  Acute on chronic HFrEF, ICM - started on entresto  UF of 3 liters with HD.  He is to have repeat RHC tomorrow.  May want to plan for HD later tonight vs first thing in am tomorrow.  Will continue to increase fluid removal.  No BB due to low output.  Vascular access - has some instent stenosis of brachial arch stent and was to have left AVG and TDC placement by Dr. Gretta, however this was postponed due to his cardiac issues.  He will hopefully get the procedure while he is an inpatient after more aggressive volume removal   ESRD -  plan for HD tomorrow to keep on his outpatient schedule but will need to be before his RHC.   Hypertension/volume  - as above, will need to challenge edw, however his UF is limited by drops in  bp.  Anemia  - stable  Metabolic bone disease -   continue with home meds  Nutrition - renal diet carb modified.  Fairy RONAL Sellar, MD [facility]

## 2024-06-02 NOTE — Plan of Care (Signed)
?  Problem: Clinical Measurements: ?Goal: Ability to maintain clinical measurements within normal limits will improve ?Outcome: Not Progressing ?  ?

## 2024-06-02 NOTE — Progress Notes (Signed)
 PROGRESS NOTE    [name]  FMW:[id number] DOB: 1966-09-17 DOA: 05/31/2024 PCP: Shelda Atlas, MD  58 y.o. 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  Presented for elective R/LHC as part of pre-op  evaluation for hernia surgery and fistula revision. He was admitted for optimization given h/o low-output HF, volume overload and severe pulmonary hypertension. He gets dialysis on MWF, last session on Monday. Fluid removal limited by flow from AV, hypotension.  He has persistent pseudoaneurysm, low access flow to his right arm AV fistula. He has had revision with previous interposition and multiple fistulogram's most recent 03/17/24. Plan is for new access. His vein mapping in the left arm shows small surface veins so likely would require graft with catheter placement. Revision surgery scheduled for Left AVG and TDC by Dr Gretta but now postponed  - RHC noted severe pulmonary hypertension, reduced cardiac output,'s SVR severely elevated at> 2400, LHC with severe multivessel CAD  Subjective: - Feels fair today, no events overnight, tolerated HD, denies dyspnea this morning at rest  Assessment and Plan:  Acute on chronic systolic heart failure (HCC) Severe pulmonary hypertension Valvular heart disease, severe AS, moderate MR, severe TR -echo 04/2024 with new systolic findings of EF of 30-35%.  RV preserved moderate to severe MAC with mild to moderate MR, severe TR, low-flow low gradient moderate to severe aortic stenosis LHC 8/12 with findings of severe multivessel CAD with occluded prior stents in the LAD and left circumflex, RHC RA 22, PA 109/56, wedge 35, cardiac index 1.6, SVR greater than 2400. Decompensated ischemic CM. Severe pulmonary HTN - Volume management with HD, sp HD yesterday -CHF team following, Entresto  dose increased - Plan for repeat RHC on Monday  Complication of AV dialysis fistula -He has persistent  pseudoaneurysm, low access flow to his right arm AV fistula. He has had revision with previous interposition and multiple fistulogram's most recent 03/17/24.  -Plan is for new access.  -His vein mapping in the left arm shows small surface veins so likely would require graft with catheter placement. -Revision surgery scheduled for 05/30/24 by Dr Gretta postponed.   Prolonged QT interval Repeat EKG today  Essential hypertension Meds as above  ESRD on dialysis [facility]) hyperkalemia Should correct with HD today  CAD (coronary artery disease) -LHC 8/12: Severe multivessel CAD with occluded prior stents in the LAD and L CFX territory supplied by these vessels is largely infarcted based on stress test. Therefore, I do not think he will benefit from revascularization to these vessels. -continue medical management  -statin intolerant  PAD (peripheral artery disease) (HCC) Left BKA, right TMA Continue ASA  Statin intolerant   Dyslipidemia Statin intolerant   Type 2 diabetes mellitus (HCC) A1c is 7.7 CBGs elevated, increase  Iron  deficiency anemia, unspecified Cbc pending  Baseline hgb around 10    DVT prophylaxis: Hep SQ Code Status: Full Code Family Communication:none present Disposition Plan: none present  Consultants:    Procedures:   Antimicrobials:    Objective: Vitals:   06/02/24 0044 06/02/24 0423 06/02/24 0816 06/02/24 0856  BP: 128/79 121/83 (!) 152/92   Pulse: 62 63 69   Resp: 18 18 18    Temp: 97.7 F (36.5 C) 97.8 F (36.6 C) 97.8 F (36.6 C)   TempSrc: Oral Oral Oral   SpO2: 100% 98% 97%   Weight:  73.8 kg  73.8 kg  Height:  Intake/Output Summary (Last 24 hours) at 06/02/2024 1204 Last data filed at 06/02/2024 1011 Gross per 24 hour  Intake 960 ml  Output --  Net 960 ml   Filed Weights   06/01/24 1155 06/02/24 0423 06/02/24 0856  Weight: 72.2 kg 73.8 kg 73.8 kg    Examination:  General exam: Chronically ill-appearing HEENT: no  JVD Respiratory system: Clear to auscultation Cardiovascular system: S1 & S2 heard, RRR.  Systolic murmur Abd: nondistended, soft and nontender.Normal bowel sounds heard. Central nervous system: Alert and oriented. No focal neurological deficits. Extremities: Left BKA, right TMA Skin: No rashes Psychiatry: flat affect    Data Reviewed:   CBC: Recent Labs  Lab 05/26/24 1246 05/31/24 1343 05/31/24 1729 06/02/24 0320  WBC 3.8  --  3.6* 2.8*  NEUTROABS  --   --  3.2  --   HGB 10.9* 10.5*  10.5* 10.5* 11.0*  HCT 34.0* 31.0*  31.0* 32.8* 33.0*  MCV 87  --  86.1 84.6  PLT 107*  --  80* 154   Basic Metabolic Panel: Recent Labs  Lab 05/26/24 1246 05/31/24 1343 05/31/24 1729 06/01/24 0221 06/02/24 0320  NA 137 136  136 134* 134* 133*  K 5.3* 5.5*  5.5* 6.2* 6.0* 4.0  CL 93*  --  98 100 94*  CO2 19*  --  20* 16* 24  GLUCOSE 134*  --  206* 251* 234*  BUN 50*  --  43* 51* 42*  CREATININE 9.68*  --  8.89* 9.44* 7.64*  CALCIUM  9.8  --  9.4 9.2 8.9  MG  --   --  2.5*  --   --    GFR: Estimated Creatinine Clearance: 10.9 mL/min (A) (by C-G formula based on SCr of 7.64 mg/dL (H)). Liver Function Tests: Recent Labs  Lab 05/31/24 1729 06/01/24 0221  AST 23 16  ALT 15 13  ALKPHOS 102 91  BILITOT 1.1 1.5*  PROT 6.9 6.3*  ALBUMIN  3.9 3.7   No results for input(s): LIPASE, AMYLASE in the last 168 hours. No results for input(s): AMMONIA in the last 168 hours. Coagulation Profile: No results for input(s): INR, PROTIME in the last 168 hours. Cardiac Enzymes: No results for input(s): CKTOTAL, CKMB, CKMBINDEX, TROPONINI in the last 168 hours. BNP (last 3 results) No results for input(s): PROBNP in the last 8760 hours. HbA1C: Recent Labs    05/31/24 1729  HGBA1C 7.7*   CBG: Recent Labs  Lab 05/31/24 2056 06/01/24 0602 06/01/24 1613 06/01/24 2100 06/02/24 0641  GLUCAP 300* 229* 204* 249* 217*   Lipid Profile: No results for input(s):  CHOL, HDL, LDLCALC, TRIG, CHOLHDL, LDLDIRECT in the last 72 hours. Thyroid  Function Tests: No results for input(s): TSH, T4TOTAL, FREET4, T3FREE, THYROIDAB in the last 72 hours. Anemia Panel: No results for input(s): VITAMINB12, FOLATE, FERRITIN, TIBC, IRON , RETICCTPCT in the last 72 hours. Urine analysis:    Component Value Date/Time   COLORURINE YELLOW 06/06/2011 2300   APPEARANCEUR CLOUDY (A) 06/06/2011 2300   LABSPEC 1.021 06/06/2011 2300   PHURINE 5.0 06/06/2011 2300   GLUCOSEU NEGATIVE 06/06/2011 2300   GLUCOSEU > or = 1000 mg/dL (AA) 92/71/7990 8784   HGBUR SMALL (A) 06/06/2011 2300   HGBUR moderate 11/01/2007 0931   BILIRUBINUR NEGATIVE 06/06/2011 2300   KETONESUR NEGATIVE 06/06/2011 2300   PROTEINUR >300 (A) 06/06/2011 2300   UROBILINOGEN 0.2 06/06/2011 2300   NITRITE NEGATIVE 06/06/2011 2300   LEUKOCYTESUR SMALL (A) 06/06/2011 2300   Sepsis Labs: @LABRCNTIP (procalcitonin:4,lacticidven:4)  )No  results found for this or any previous visit (from the past 240 hours).   Radiology Studies: CARDIAC CATHETERIZATION Result Date: 05/31/2024 Images from the original result were not included. Coronary angiography 05/31/2024: LM: No significant disease LAD: Occluded mid LAD stent        Faint collaterals filling distal LAD        Diag 2 with 80% stenosis Ramus: Small caliber vessel, prox 90% stenosis Lcx: Prox 70%, mid 80% stenoses. Occluded OM 3 stent RCA: Large, dominant vessel          Mid focal 60% stenosis         LVEDP 22 mmHg Right heart catheterization 05/31/2024: RA: 22 mmHg RV: 105/10 mmHg PA: 109/56 mmHg, mPAP 81 mmHg PCW: 35 mmHg AO sats: 96% PA sats: 41% Fick: CO: 3.1 L/min CI: 1.6 L/min/m2 Thermodilution: CO: 3.0 L/min CI: 1.5 L/min/m2 Conclusion: Severe multivessel CAD with occluded prior stents in the LAD and left circumflex Territory supplied by these vessels is largely infarcted based on stress test findings.  Therefore, I do not think he  will benefit from revascularization to these vessels. 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 Recommend medical admission, with heart failure team input (Dr. Zenaida already aware) [name], MD     Scheduled Meds:  aspirin  EC  81 mg Oral Daily   heparin   5,000 Units Subcutaneous Q8H   insulin  aspart  0-6 Units Subcutaneous TID WC   insulin  aspart  2 Units Subcutaneous TID WC   insulin  glargine-yfgn  10 Units Subcutaneous Daily   ondansetron  (ZOFRAN ) IV  4 mg Intravenous Once   pantoprazole   40 mg Oral Daily   sacubitril -valsartan   1 tablet Oral BID   sevelamer  carbonate  1,600-2,400 mg Oral TID WC   sodium chloride  flush  3 mL Intravenous Q12H   sodium chloride  flush  3 mL Intravenous Q12H   traZODone   150 mg Oral QHS   Continuous Infusions:  sodium chloride        LOS: 2 days    Time spent:    Sigurd Pac, MD [facility]   06/02/2024, 12:04 PM

## 2024-06-02 NOTE — Progress Notes (Signed)
 Received patient in bed.Alert and oriented x 4. Consent verified.  Access used: Right arm avf that worked well.  Duration of treatment: 3.5 hours.  Uf goal 2.6 liters.  He quit on his last 22 minutes of treatment.  Hand off to the patient's nurse ,back intohis room with stable condition via transporter.

## 2024-06-03 ENCOUNTER — Encounter (HOSPITAL_COMMUNITY)
Admission: RE | Disposition: A | Discharge status: Home / Self Care | Payer: Self-pay | Source: Home / Self Care | Attending: Internal Medicine

## 2024-06-03 DIAGNOSIS — I2721 Secondary pulmonary arterial hypertension: Secondary | ICD-10-CM

## 2024-06-03 DIAGNOSIS — I5023 Acute on chronic systolic (congestive) heart failure: Secondary | ICD-10-CM | POA: Diagnosis not present

## 2024-06-03 HISTORY — PX: RIGHT HEART CATH: CATH118263

## 2024-06-03 LAB — CBC
HCT: 37.4 % — ABNORMAL LOW (ref 39.0–52.0)
Hemoglobin: 12 g/dL — ABNORMAL LOW (ref 13.0–17.0)
MCH: 27 pg (ref 26.0–34.0)
MCHC: 32.1 g/dL (ref 30.0–36.0)
MCV: 84.2 fL (ref 80.0–100.0)
Platelets: 123 K/uL — ABNORMAL LOW (ref 150–400)
RBC: 4.44 MIL/uL (ref 4.22–5.81)
RDW: 14.1 % (ref 11.5–15.5)
WBC: 3 K/uL — ABNORMAL LOW (ref 4.0–10.5)
nRBC: 0 % (ref 0.0–0.2)

## 2024-06-03 LAB — POCT I-STAT EG7
Acid-Base Excess: 4 mmol/L — ABNORMAL HIGH (ref 0.0–2.0)
Acid-Base Excess: 5 mmol/L — ABNORMAL HIGH (ref 0.0–2.0)
Acid-Base Excess: 2 mmol/L (ref 0.0–2.0)
Bicarbonate: 29.7 mmol/L — ABNORMAL HIGH (ref 20.0–28.0)
Bicarbonate: 30.8 mmol/L — ABNORMAL HIGH (ref 20.0–28.0)
Bicarbonate: 27.3 mmol/L (ref 20.0–28.0)
Calcium, Ion: 1.15 mmol/L (ref 1.15–1.40)
Calcium, Ion: 1.16 mmol/L (ref 1.15–1.40)
Calcium, Ion: 0.96 mmol/L — ABNORMAL LOW (ref 1.15–1.40)
HCT: 33 % — ABNORMAL LOW (ref 39.0–52.0)
HCT: 34 % — ABNORMAL LOW (ref 39.0–52.0)
HCT: 30 % — ABNORMAL LOW (ref 39.0–52.0)
Hemoglobin: 11.2 g/dL — ABNORMAL LOW (ref 13.0–17.0)
Hemoglobin: 11.6 g/dL — ABNORMAL LOW (ref 13.0–17.0)
Hemoglobin: 10.2 g/dL — ABNORMAL LOW (ref 13.0–17.0)
O2 Saturation: 60 %
O2 Saturation: 61 %
O2 Saturation: 69 %
Potassium: 4.3 mmol/L (ref 3.5–5.1)
Potassium: 4.3 mmol/L (ref 3.5–5.1)
Potassium: 3.7 mmol/L (ref 3.5–5.1)
Sodium: 136 mmol/L (ref 135–145)
Sodium: 137 mmol/L (ref 135–145)
Sodium: 142 mmol/L (ref 135–145)
TCO2: 31 mmol/L (ref 22–32)
TCO2: 32 mmol/L (ref 22–32)
TCO2: 29 mmol/L (ref 22–32)
pCO2, Ven: 48.6 mmHg (ref 44–60)
pCO2, Ven: 49.9 mmHg (ref 44–60)
pCO2, Ven: 45.6 mmHg (ref 44–60)
pH, Ven: 7.393 (ref 7.25–7.43)
pH, Ven: 7.399 (ref 7.25–7.43)
pH, Ven: 7.385 (ref 7.25–7.43)
pO2, Ven: 32 mmHg (ref 32–45)
pO2, Ven: 32 mmHg (ref 32–45)
pO2, Ven: 37 mmHg (ref 32–45)

## 2024-06-03 LAB — GLUCOSE, CAPILLARY
Glucose-Capillary: 178 mg/dL — ABNORMAL HIGH (ref 70–99)
Glucose-Capillary: 229 mg/dL — ABNORMAL HIGH (ref 70–99)
Glucose-Capillary: 246 mg/dL — ABNORMAL HIGH (ref 70–99)

## 2024-06-03 LAB — BASIC METABOLIC PANEL WITH GFR
Anion gap: 15 (ref 5–15)
BUN: 35 mg/dL — ABNORMAL HIGH (ref 6–20)
CO2: 27 mmol/L (ref 22–32)
Calcium: 8.9 mg/dL (ref 8.9–10.3)
Chloride: 97 mmol/L — ABNORMAL LOW (ref 98–111)
Creatinine, Ser: 6.52 mg/dL — ABNORMAL HIGH (ref 0.61–1.24)
GFR, Estimated: 9 mL/min — ABNORMAL LOW (ref >60)
Glucose, Bld: 199 mg/dL — ABNORMAL HIGH (ref 70–99)
Potassium: 4.3 mmol/L (ref 3.5–5.1)
Sodium: 139 mmol/L (ref 135–145)

## 2024-06-03 SURGERY — RIGHT HEART CATH
Anesthesia: LOCAL

## 2024-06-03 MED ORDER — LIDOCAINE HCL (PF) 1 % IJ SOLN
INTRAMUSCULAR | Status: AC
  Filled 2024-06-03: qty 30

## 2024-06-03 MED ORDER — NITROGLYCERIN 0.4 MG SL SUBL
0.4000 mg | SUBLINGUAL_TABLET | SUBLINGUAL | Status: DC | PRN
  Administered 2024-06-05 (×2): 0.4 mg via SUBLINGUAL
  Filled 2024-06-03: qty 1

## 2024-06-03 MED ORDER — SODIUM CHLORIDE 0.9% FLUSH
3.0000 mL | Freq: Two times a day (BID) | INTRAVENOUS | Status: DC
  Administered 2024-06-03 – 2024-06-07 (×10): 3 mL via INTRAVENOUS

## 2024-06-03 MED ORDER — HEPARIN (PORCINE) IN NACL 1000-0.9 UT/500ML-% IV SOLN
INTRAVENOUS | Status: DC | PRN
  Administered 2024-06-03: 500 mL

## 2024-06-03 MED ORDER — LIDOCAINE HCL (PF) 1 % IJ SOLN
INTRAMUSCULAR | Status: DC | PRN
  Administered 2024-06-03 (×2): 2 mL

## 2024-06-03 MED ORDER — ENOXAPARIN SODIUM 40 MG/0.4ML IJ SOSY: 40.0000 mg | PREFILLED_SYRINGE | INTRAMUSCULAR | Status: DC

## 2024-06-03 MED ORDER — SODIUM CHLORIDE 0.9% FLUSH: 3.0000 mL | INTRAVENOUS | Status: DC | PRN

## 2024-06-03 MED ORDER — HEPARIN SODIUM (PORCINE) 5000 UNIT/ML IJ SOLN
5000.0000 [IU] | Freq: Three times a day (TID) | INTRAMUSCULAR | Status: DC
  Administered 2024-06-04 – 2024-06-07 (×7): 5000 [IU] via SUBCUTANEOUS
  Filled 2024-06-03 (×11): qty 1

## 2024-06-03 MED ORDER — SODIUM CHLORIDE 0.9 % IV SOLN: 250.0000 mL | INTRAVENOUS | Status: AC | PRN

## 2024-06-03 MED ORDER — METOCLOPRAMIDE HCL 5 MG/ML IJ SOLN
10.0000 mg | Freq: Four times a day (QID) | INTRAMUSCULAR | Status: DC | PRN
  Administered 2024-06-03 (×2): 10 mg via INTRAVENOUS
  Filled 2024-06-03 (×2): qty 2

## 2024-06-03 MED ORDER — HYDRALAZINE HCL 20 MG/ML IJ SOLN: 10.0000 mg | INTRAMUSCULAR | Status: AC | PRN

## 2024-06-03 MED ORDER — ACETAMINOPHEN 325 MG PO TABS
650.0000 mg | ORAL_TABLET | ORAL | Status: DC | PRN
  Administered 2024-06-07 – 2024-06-08 (×2): 650 mg via ORAL

## 2024-06-03 SURGICAL SUPPLY — 8 items
CATH SWAN GANZ 7F STRAIGHT (CATHETERS) IMPLANT
KIT MICROPUNCTURE NIT STIFF (SHEATH) IMPLANT
PACK CARDIAC CATHETERIZATION (CUSTOM PROCEDURE TRAY) ×1 IMPLANT
SHEATH PINNACLE 7F 10CM (SHEATH) IMPLANT
SHEATH PROBE COVER 6X72 (BAG) IMPLANT
TRANSDUCER W/STOPCOCK (MISCELLANEOUS) IMPLANT
TUBING ART PRESS 72 MALE/FEM (TUBING) IMPLANT
WIRE MICROINTRODUCER 60CM (WIRE) IMPLANT

## 2024-06-03 NOTE — Progress Notes (Addendum)
 Advanced Heart Failure Rounding Note  Cardiologist: [name], MD   Chief Complaint: Acute on chronic HFrEF with low-output  Subjective:    RHC today with significantly improved pressures post dialysis. RA 1, PA 43/12 (25), PCW 7, Fick CO/CI 4.6/2.4, TD CO/CI 4.7/2.4, PAPi 31.   Resting comfortably in bed, wife at bedside. Feels good this morning post cath.   Objective:   Weight Range: 72 kg Body mass index is 22.78 kg/m.   Vital Signs:   Temp:  [97.5 F (36.4 C)-98.1 F (36.7 C)] 97.5 F (36.4 C) (08/15 0850) Pulse Rate:  [61-81] 71 (08/15 0834) Resp:  [10-23] 18 (08/15 0850) BP: (120-164)/(81-111) 143/88 (08/15 0834) SpO2:  [91 %-100 %] 93 % (08/15 0829) Weight:  [72 kg-74.8 kg] 72 kg (08/15 0606) Last BM Date : 06/01/24  Weight change: Filed Weights   06/02/24 1502 06/02/24 1939 06/03/24 0606  Weight: 74.8 kg 72.2 kg 72 kg    Intake/Output:   Intake/Output Summary (Last 24 hours) at 06/03/2024 1124 Last data filed at 06/02/2024 2200 Gross per 24 hour  Intake 540 ml  Output 2600 ml  Net -2060 ml      Physical Exam   General:  chronically ill appearing.  No respiratory difficulty Neck: JVD ~6 cm.  Cor: Regular rate & rhythm. 2/6 systolic murmur. Lungs: clear Extremities: L BKA, R TMA, no edema Neuro: alert & oriented x 3. Affect pleasant.   Telemetry   SR 80s (Personally reviewed)    Labs    CBC Recent Labs    05/31/24 1729 06/02/24 0320 06/03/24 0235  WBC 3.6* 2.8* 3.0*  NEUTROABS 3.2  --   --   HGB 10.5* 11.0* 12.0*  HCT 32.8* 33.0* 37.4*  MCV 86.1 84.6 84.2  PLT 80* 154 123*   Basic Metabolic Panel Recent Labs    91/87/74 1729 06/01/24 0221 06/02/24 0320 06/03/24 0235  NA 134*   < > 133* 139  K 6.2*   < > 4.0 4.3  CL 98   < > 94* 97*  CO2 20*   < > 24 27  GLUCOSE 206*   < > 234* 199*  BUN 43*   < > 42* 35*  CREATININE 8.89*   < > 7.64* 6.52*  CALCIUM  9.4   < > 8.9 8.9  MG 2.5*  --   --   --    < > = values in  this interval not displayed.   Liver Function Tests Recent Labs    05/31/24 1729 06/01/24 0221  AST 23 16  ALT 15 13  ALKPHOS 102 91  BILITOT 1.1 1.5*  PROT 6.9 6.3*  ALBUMIN  3.9 3.7   No results for input(s): LIPASE, AMYLASE in the last 72 hours. Cardiac Enzymes No results for input(s): CKTOTAL, CKMB, CKMBINDEX, TROPONINI in the last 72 hours.  BNP: BNP (last 3 results) No results for input(s): BNP in the last 8760 hours.  ProBNP (last 3 results) No results for input(s): PROBNP in the last 8760 hours.   D-Dimer No results for input(s): DDIMER in the last 72 hours. Hemoglobin A1C Recent Labs    05/31/24 1729  HGBA1C 7.7*   Fasting Lipid Panel No results for input(s): CHOL, HDL, LDLCALC, TRIG, CHOLHDL, LDLDIRECT in the last 72 hours. Thyroid  Function Tests No results for input(s): TSH, T4TOTAL, T3FREE, THYROIDAB in the last 72 hours.  Invalid input(s): FREET3  Other results:   Imaging    CARDIAC CATHETERIZATION Result Date: 06/03/2024  Findings: RA = 1 RV = 44/5 PA = 43/12 (25) PCW = 7 Fick cardiac output/index = 4.6/2.4 Thermo CO/CI = 4.7/2.4 PVR = 3.9 Ao sat = 95% PA sat = 60%, 61% PAPi = 31 Assessment: 1. Much improved hemodynamics 2. Mild PAH with normal left-sided filling pressures and CO Plan/Discussion: Continue current plan. Ok for d/c today from cardiology standpoint. Kaylob Wallen, MD 8:40 AM     Medications:     Scheduled Medications:  aspirin  EC  81 mg Oral Daily   [START ON 06/04/2024] heparin  injection (subcutaneous)  5,000 Units Subcutaneous Q8H   insulin  aspart  0-6 Units Subcutaneous TID WC   insulin  aspart  2 Units Subcutaneous TID WC   insulin  glargine-yfgn  10 Units Subcutaneous Daily   pantoprazole   40 mg Oral Daily   sacubitril -valsartan   1 tablet Oral BID   sevelamer  carbonate  1,600-2,400 mg Oral TID WC   sodium chloride  flush  3 mL Intravenous Q12H   sodium chloride  flush  3 mL  Intravenous Q12H   traZODone   150 mg Oral QHS    Infusions:  sodium chloride        PRN Medications: sodium chloride , acetaminophen  **OR** acetaminophen , acetaminophen , hydrALAZINE , hydrALAZINE , hydrOXYzine , loperamide , metoCLOPramide  (REGLAN ) injection, nitroGLYCERIN , sodium chloride  flush, sodium chloride  flush    Patient Profile   58 y.o. 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   Presented for elective R/LHC as part of pre-op  evaluation for hernia surgery and fistula revision. He was admitted for optimization given low-output HF, volume overload and severe pulmonary hypertension.  Assessment/Plan  Acute on chronic HFrEF -Ischemic cardiomyopathy -Echo 05/17/24: LVEF 30-35%, RV okay, moderate to severe MAC with mild to moderate MR, severe TR, low flow low gradient moderate to severe AS with AVA 0.96 cm2 by VTI and mean gradient 10 mmHg, indexed AVA 0.51 cm2/m2 -Hx multivessel PCI. LHC 05/31/24 w/ multivessel CAD including occluded stents in LAD and LCX treated medically. RHC: RA 22, PA 109/56 (81), PCWP 35, Fick CI 1.6, TD CI 1.5, PVR 11, SVR > 2400. -No beta blocker with low-output -BP improving. Continue entresto  49/51 mg BID. May need to hold on am of HD days if BP drops during sessions. Tolerating so far.  -Would favor performing vascular procedures while inpatient. VVS consulted, they saw 8/13. Plan to re-consult if aVF dysfunctioning.  - RHC today with significantly improved pressures post dialysis. RA 1, PA 43/12 (25), PCW 7, Fick CO/CI 4.6/2.4, TD CO/CI 4.7/2.4, PAPi 31.   CAD -Hx prior PCIs. LHC 08/25 with multivessel CAD managed medically as above. No targets for PCI and poor CABG candidate. -Continue aspirin  81 mg daily -Intolerant to statins (rosuvastatin and atorvastatin ) as well as zetia   ESRD -volume managed with HD. -Nephrology following.  -He declined additional HD session 08/12. UF has been  limited by cramping and BP. Reports more trouble with HD the last few weeks. - Tolerated iHD last 2 days. Plan for next trt Monday  HTN -BP improving post iHD and increased Entresto  dose.  -Meds as above  Hyperkalemia -resolved with iHD  Valvular heart disease - Last echo 07/25: EF reduced as above, moderate to severe MAC with mild to moderate MR, severe TR, low flow low gradient moderate to severe AS with AVA 0.96 cm2 by VTI and mean gradient 10 mmHg, indexed AVA 0.51 cm2/m2  PAD -S/p L BKA and R TMA  Stable from AHF standpoint  for discharge once cleared by VVS, nephrology and primary. Will arrange close f/u in AHF clinic.   Length of Stay: 3  Beckey LITTIE Coe, NP  06/03/2024, 11:24 AM  Advanced Heart Failure Team Pager [phone] (M-F; 7a - 5p)  Please contact [facility] for night-coverage after hours (5p -7a ) and weekends on [email or web address]  Patient seen and examined with the above-signed Advanced Practice Provider and/or Housestaff. I personally reviewed laboratory data, imaging studies and relevant notes. I independently examined the patient and formulated the important aspects of the plan. I have edited the note to reflect any of my changes or salient points. I have personally discussed the plan with the patient and/or family.  RHC today with much improved hemodynamics after pushing dry weight and controlling BP.  Denies CP or SOB  General:  Sitting up No resp difficulty HEENT: normal Neck: supple. no JVD. C Cor: PMI nondisplaced. Regular rate & rhythm. 2/6 AS Lungs: clear Abdomen: soft, nontender, nondistended. No hepatosplenomegaly. No bruits or masses. Good bowel sounds. Extremities: no cyanosis, clubbing, rash, edema RUE AVF Neuro: alert & orientedx3, cranial nerves grossly intact. moves all 4 extremities w/o difficulty. Affect pleasant  Hemodynamics much improved. D/w Renal will keep dry weight down. Can revisit surgical risk stratification as an outpatient.   AS is not  severe  Can go home today from our standpoint. We will arrange outpatient f/u.  Toribio Fuel, MD  3:53 PM

## 2024-06-03 NOTE — Plan of Care (Signed)
  Problem: Education: Goal: Understanding of CV disease, CV risk reduction, and recovery process will improve Outcome: Progressing Goal: Individualized Educational Video(s) Outcome: Progressing   Problem: Activity: Goal: Ability to return to baseline activity level will improve Outcome: Progressing   Problem: Cardiovascular: Goal: Ability to achieve and maintain adequate cardiovascular perfusion will improve Outcome: Progressing Goal: Vascular access site(s) Level 0-1 will be maintained Outcome: Progressing   Problem: Health Behavior/Discharge Planning: Goal: Ability to safely manage health-related needs after discharge will improve Outcome: Progressing   Problem: Education: Goal: Knowledge of General Education information will improve Description: Including pain rating scale, medication(s)/side effects and non-pharmacologic comfort measures Outcome: Progressing   Problem: Health Behavior/Discharge Planning: Goal: Ability to manage health-related needs will improve Outcome: Progressing   Problem: Clinical Measurements: Goal: Ability to maintain clinical measurements within normal limits will improve Outcome: Progressing Goal: Will remain free from infection Outcome: Progressing Goal: Diagnostic test results will improve Outcome: Progressing Goal: Respiratory complications will improve Outcome: Progressing Goal: Cardiovascular complication will be avoided Outcome: Progressing   Problem: Activity: Goal: Risk for activity intolerance will decrease Outcome: Progressing   Problem: Nutrition: Goal: Adequate nutrition will be maintained Outcome: Progressing   Problem: Coping: Goal: Level of anxiety will decrease Outcome: Progressing   Problem: Elimination: Goal: Will not experience complications related to bowel motility Outcome: Progressing Goal: Will not experience complications related to urinary retention Outcome: Progressing   Problem: Pain Managment: Goal:  General experience of comfort will improve and/or be controlled Outcome: Progressing   Problem: Safety: Goal: Ability to remain free from injury will improve Outcome: Progressing   Problem: Skin Integrity: Goal: Risk for impaired skin integrity will decrease Outcome: Progressing   Problem: Education: Goal: Ability to describe self-care measures that may prevent or decrease complications (Diabetes Survival Skills Education) will improve Outcome: Progressing Goal: Individualized Educational Video(s) Outcome: Progressing   Problem: Coping: Goal: Ability to adjust to condition or change in health will improve Outcome: Progressing   Problem: Fluid Volume: Goal: Ability to maintain a balanced intake and output will improve Outcome: Progressing   Problem: Health Behavior/Discharge Planning: Goal: Ability to identify and utilize available resources and services will improve Outcome: Progressing Goal: Ability to manage health-related needs will improve Outcome: Progressing   Problem: Metabolic: Goal: Ability to maintain appropriate glucose levels will improve Outcome: Progressing   Problem: Nutritional: Goal: Maintenance of adequate nutrition will improve Outcome: Progressing Goal: Progress toward achieving an optimal weight will improve Outcome: Progressing   Problem: Skin Integrity: Goal: Risk for impaired skin integrity will decrease Outcome: Progressing   Problem: Tissue Perfusion: Goal: Adequacy of tissue perfusion will improve Outcome: Progressing

## 2024-06-03 NOTE — Progress Notes (Signed)
 Patient ID: [name], male   DOB: 03/11/1966, 58 y.o.   MRN: [id number] S: Feeling better after HD with UF.  LHC markedly improved. O:BP (!) 143/88   Pulse 71   Temp (!) 97.5 F (36.4 C) (Oral)   Resp 18   Ht 5' 10 (1.778 m)   Wt 72 kg   SpO2 93%   BMI 22.78 kg/m   Intake/Output Summary (Last 24 hours) at 06/03/2024 1011 Last data filed at 06/02/2024 2200 Gross per 24 hour  Intake 540 ml  Output 2600 ml  Net -2060 ml   Intake/Output: I/O last 3 completed shifts: In: 1260 [P.O.:1260] Out: 2600 [Other:2600]  Intake/Output this shift:  No intake/output data recorded. Weight change: -1.4 kg Gen: NAD CVS: RRR Resp:CTA Abd: _BS, soft, NT/ND Ext: RUE AVF +T/B, no edema, s/p RTMA, s/p LBKA  Recent Labs  Lab 05/31/24 1343 05/31/24 1729 06/01/24 0221 06/02/24 0320 06/03/24 0235  NA 136  136 134* 134* 133* 139  K 5.5*  5.5* 6.2* 6.0* 4.0 4.3  CL  --  98 100 94* 97*  CO2  --  20* 16* 24 27  GLUCOSE  --  206* 251* 234* 199*  BUN  --  43* 51* 42* 35*  CREATININE  --  8.89* 9.44* 7.64* 6.52*  ALBUMIN   --  3.9 3.7  --   --   CALCIUM   --  9.4 9.2 8.9 8.9  AST  --  23 16  --   --   ALT  --  15 13  --   --    Liver Function Tests: Recent Labs  Lab 05/31/24 1729 06/01/24 0221  AST 23 16  ALT 15 13  ALKPHOS 102 91  BILITOT 1.1 1.5*  PROT 6.9 6.3*  ALBUMIN  3.9 3.7   No results for input(s): LIPASE, AMYLASE in the last 168 hours. No results for input(s): AMMONIA in the last 168 hours. CBC: Recent Labs  Lab 05/31/24 1729 06/02/24 0320 06/03/24 0235  WBC 3.6* 2.8* 3.0*  NEUTROABS 3.2  --   --   HGB 10.5* 11.0* 12.0*  HCT 32.8* 33.0* 37.4*  MCV 86.1 84.6 84.2  PLT 80* 154 123*   Cardiac Enzymes: No results for input(s): CKTOTAL, CKMB, CKMBINDEX, TROPONINI in the last 168 hours. CBG: Recent Labs  Lab 06/01/24 2100 06/02/24 0641 06/02/24 1212 06/02/24 2011 06/03/24 0615  GLUCAP 249* 217* 247* 178* 178*    Iron  Studies: No results for  input(s): IRON , TIBC, TRANSFERRIN, FERRITIN in the last 72 hours. Studies/Results: CARDIAC CATHETERIZATION Result Date: 06/03/2024 Findings: RA = 1 RV = 44/5 PA = 43/12 (25) PCW = 7 Fick cardiac output/index = 4.6/2.4 Thermo CO/CI = 4.7/2.4 PVR = 3.9 Ao sat = 95% PA sat = 60%, 61% PAPi = 31 Assessment: 1. Much improved hemodynamics 2. Mild PAH with normal left-sided filling pressures and CO Plan/Discussion: Continue current plan. Ok for d/c today from cardiology standpoint. Daniel Bensimhon, MD 8:40 AM   aspirin  EC  81 mg Oral Daily   [START ON 06/04/2024] heparin  injection (subcutaneous)  5,000 Units Subcutaneous Q8H   insulin  aspart  0-6 Units Subcutaneous TID WC   insulin  aspart  2 Units Subcutaneous TID WC   insulin  glargine-yfgn  10 Units Subcutaneous Daily   pantoprazole   40 mg Oral Daily   sacubitril -valsartan   1 tablet Oral BID   sevelamer  carbonate  1,600-2,400 mg Oral TID WC   sodium chloride  flush  3 mL Intravenous Q12H   sodium  chloride flush  3 mL Intravenous Q12H   traZODone   150 mg Oral QHS    BMET    Component Value Date/Time   NA 139 06/03/2024 0235   NA 137 05/26/2024 1246   K 4.3 06/03/2024 0235   CL 97 (L) 06/03/2024 0235   CO2 27 06/03/2024 0235   GLUCOSE 199 (H) 06/03/2024 0235   BUN 35 (H) 06/03/2024 0235   BUN 50 (H) 05/26/2024 1246   CREATININE 6.52 (H) 06/03/2024 0235   CREATININE 10.48 (H) 06/02/2023 1700   CALCIUM  8.9 06/03/2024 0235   CALCIUM  7.9 (L) 08/31/2009 1524   GFRNONAA 9 (L) 06/03/2024 0235   GFRAA 6 (L) 08/05/2019 0537   CBC    Component Value Date/Time   WBC 3.0 (L) 06/03/2024 0235   RBC 4.44 06/03/2024 0235   HGB 12.0 (L) 06/03/2024 0235   HGB 10.9 (L) 05/26/2024 1246   HCT 37.4 (L) 06/03/2024 0235   HCT 34.0 (L) 05/26/2024 1246   PLT 123 (L) 06/03/2024 0235   PLT 107 (L) 05/26/2024 1246   MCV 84.2 06/03/2024 0235   MCV 87 05/26/2024 1246   MCH 27.0 06/03/2024 0235   MCHC 32.1 06/03/2024 0235   RDW 14.1 06/03/2024 0235    RDW 14.7 05/26/2024 1246   LYMPHSABS 0.3 (L) 05/31/2024 1729   MONOABS 0.1 05/31/2024 1729   EOSABS 0.0 05/31/2024 1729   BASOSABS 0.0 05/31/2024 1729    Dialysis Orders: Center: [facility]   on MWF . New EDW 72 kg HD [place] 1K/2.5Ca  Time 4:00 Heparin  none. Access RUE AVF BFR 400 DFR 800    Hectoral 3 mcg IV/HD  Assessment/Plan:  Acute on chronic HFrEF, ICM - started on entresto  UF of 3 liters with HD.  RHC markedly improved after UF of 2.6L yesterday.  Post edw was 72 kg which will now be his new edw.  No BB due to low output.  Vascular access - has some instent stenosis of brachial arch stent and was to have left AVG and TDC placement by Dr. Gretta, however this was postponed due to his cardiac issues.  He will hopefully get the procedure while he is an inpatient since his cardiovascular status has improved with UF.  Having some bleeding from his RUE AVF last night.    ESRD -  Had HD late last night.  Hold off on further HD for now and resume on Monday as long as his idwg is stable.     Hypertension/volume  - as above, will need to challenge edw, however his UF is limited by drops in bp.  New EDW 72kg with improved RHC measurements.  Anemia  - stable  Metabolic bone disease -   continue with home meds  Nutrition - renal diet carb modified.  Fairy RONAL Sellar, MD [facility]

## 2024-06-03 NOTE — Progress Notes (Signed)
 Pt c/o nausea/chest pain and indigestion.   VVS, pt not in distress.  Provider notified. Ordered nitroglycerin , EKG and Reglan .  Pt is refusing the nitro and the EKG now. Pt states his chest pain is resolving. Just c/o nausea/anxiety and indigestion. Gave Reglan  and will recheck pt later. POC maintained, will continue to monitor.

## 2024-06-03 NOTE — TOC Progression Note (Signed)
 Transition of Care [facility]) - Progression Note    Patient Details  Name: [name] MRN: [id number] Date of Birth: Sep 09, 1966  Transition of [facility]) CM/SW Contact  Justina Delcia Czar, RN Phone Number: [phone] 06/03/2024, 4:47 PM  Clinical Narrative:     Spoke to pt and states he lives at home with wife. Pt states he is able to drive to HD or his wife will provide transport. Pt was independent pta.  Pt states he monitor his weight closely due to dialysis. Provided pt with Living Better with HF. States he does monitor to his diet because he has to adhere to Renal and Cardiac diets.   Will continue to follow for dc needs.   Expected Discharge Plan: Home/Self Care Barriers to Discharge: Continued Medical Work up               Expected Discharge Plan and Services   Discharge Planning Services: CM Consult                                           Social Drivers of Health (SDOH) Interventions SDOH Screenings   Food Insecurity: No Food Insecurity (05/31/2024)  Housing: Low Risk  (05/31/2024)  Transportation Needs: No Transportation Needs (05/31/2024)  Utilities: Not At Risk (05/31/2024)  Depression (PHQ2-9): Medium Risk (01/19/2024)  Physical Activity: Inactive (01/19/2024)  Stress: Stress Concern Present (01/19/2024)  Tobacco Use: Low Risk  (05/31/2024)    Readmission Risk Interventions    06/01/2024    1:07 PM  Readmission Risk Prevention Plan  Transportation Screening Complete  PCP or Specialist Appt within 5-7 Days Complete  Home Care Screening Complete  Medication Review (RN CM) Complete

## 2024-06-03 NOTE — Plan of Care (Signed)
 Pt refuses all insulin , says, I have high BS b/c I'm stressed, it will go back down when I'm not stressed naturally. Pt's education could be updated, but I'm not sure his internal beliefs will let him receive education.   Velinda Server, RN 06/03/2024 6:30 PM

## 2024-06-03 NOTE — Progress Notes (Signed)
 Pt refused staff to view groin sites.  Pt states wound sites are not bleeding and no swelling at site.  Pt did have minor bleeding from fistula site post dialysis and dressing re-enforced.  Pt refused heparin .  Provider aware of all.

## 2024-06-03 NOTE — Progress Notes (Addendum)
 PROGRESS NOTE    [name]  FMW:[id number] DOB: 02-10-1966 DOA: 05/31/2024 PCP: Shelda Atlas, MD  58/M w systolic CHF, CAD, severe aortic stenosis, moderate MR, severe TR PAD with prior left BKA and R TMA, ESRD on HD, DM II. Presented for elective R/LHC as part of pre-op  evaluation for hernia surgery and fistula revision. He was admitted for optimization given h/o low-output HF, volume overload and severe pulmonary hypertension.  Right arm AV fistula complicated by pseudoaneurysm, in-stent stenosis>resulting in low flow and cannulation issues at HD. Revision surgery scheduled for Left AVG and TDC by Dr Gretta then postponed - RHC noted severe pulmonary hypertension, reduced cardiac output,'s SVR severely elevated at> 2400, LHC with severe multivessel CAD -sp multiple dialysis treatments - Repeat right heart cath with significantly improved PA pressures and hemodynamics  Subjective: - Feels okay, just back from right heart cath  Assessment and Plan:  Acute on chronic systolic heart failure (HCC) Severe pulmonary hypertension Valvular heart disease, severe AS, moderate MR, severe TR -echo 04/2024 with new systolic findings of EF of 30-35%.  RV preserved moderate to severe MAC with mild to moderate MR, severe TR, low-flow low gradient moderate to severe aortic stenosis LHC 8/12 with findings of severe multivessel CAD with occluded prior stents in the LAD and left circumflex, RHC RA 22, PA 109/56, wedge 35, cardiac index 1.6, SVR greater than 2400. Decompensated ischemic CM. Severe pulmonary HTN - Significant improvement after repeat HD last night, repeat RHC with normal filling pressures and improved PA pressures - Remains on Entresto , blood pressure stable on this regimen - Discussed with vascular surgery re: access  Complication of AV dialysis fistula -He has persistent pseudoaneurysm, low access flow to his right arm AV fistula. He has had revision with previous interposition and multiple  fistulogram's most recent 03/17/24.  -Plan is for new access. His vein mapping in the left arm shows small surface veins so likely would require graft with TDC placement. -Revision surgery scheduled for 05/30/24 by Dr Gretta postponed.  -d/w VVS again for access while inpt  Prolonged QT interval  Essential hypertension Meds as above  ESRD on dialysis [name]) hyperkalemia Last HD yesterday, potassium improved and monitor while on Entresto   CAD (coronary artery disease) -LHC 8/12: Severe multivessel CAD with occluded prior stents in the LAD and L CFX territory supplied by these vessels is largely infarcted based on stress test. Therefore, I do not think he will benefit from revascularization to these vessels. -continue medical management  -statin intolerant  PAD (peripheral artery disease) (HCC) Left BKA, right TMA Continue ASA  Statin intolerant   Dyslipidemia Statin intolerant   Type 2 diabetes mellitus (HCC) A1c is 7.7 CBGs elevated, refuses insulin   Iron  deficiency anemia, unspecified Cbc pending  Baseline hgb around 10    DVT prophylaxis: Hep SQ Code Status: Full Code Family Communication:none present Disposition Plan: none present  Consultants:    Procedures:   Antimicrobials:    Objective: Vitals:   06/03/24 0829 06/03/24 0834 06/03/24 0850 06/03/24 1134  BP: (!) 140/89 (!) 143/88  101/66  Pulse: 70 71    Resp: 15 19 18 18   Temp:   (!) 97.5 F (36.4 C) 98 F (36.7 C)  TempSrc:   Oral Oral  SpO2: 93%   98%  Weight:      Height:        Intake/Output Summary (Last 24 hours) at 06/03/2024 1144 Last data filed at 06/02/2024 2200 Gross per 24 hour  Intake  540 ml  Output 2600 ml  Net -2060 ml   Filed Weights   06/02/24 1502 06/02/24 1939 06/03/24 0606  Weight: 74.8 kg 72.2 kg 72 kg    Examination:  General exam: Chronically ill-appearing HEENT: no JVD Respiratory system: Clear Cardiovascular system: S1 & S2 heard, RRR.  Systolic murmur Abd:  nondistended, soft and nontender.Normal bowel sounds heard. Central nervous system: Alert and oriented. No focal neurological deficits. Extremities: Left BKA, right TMA Skin: No rashes Psychiatry: flat affect    Data Reviewed:   CBC: Recent Labs  Lab 05/31/24 1343 05/31/24 1729 06/02/24 0320 06/03/24 0235  WBC  --  3.6* 2.8* 3.0*  NEUTROABS  --  3.2  --   --   HGB 10.5*  10.5* 10.5* 11.0* 12.0*  HCT 31.0*  31.0* 32.8* 33.0* 37.4*  MCV  --  86.1 84.6 84.2  PLT  --  80* 154 123*   Basic Metabolic Panel: Recent Labs  Lab 05/31/24 1343 05/31/24 1729 06/01/24 0221 06/02/24 0320 06/03/24 0235  NA 136  136 134* 134* 133* 139  K 5.5*  5.5* 6.2* 6.0* 4.0 4.3  CL  --  98 100 94* 97*  CO2  --  20* 16* 24 27  GLUCOSE  --  206* 251* 234* 199*  BUN  --  43* 51* 42* 35*  CREATININE  --  8.89* 9.44* 7.64* 6.52*  CALCIUM   --  9.4 9.2 8.9 8.9  MG  --  2.5*  --   --   --    GFR: Estimated Creatinine Clearance: 12.6 mL/min (A) (by C-G formula based on SCr of 6.52 mg/dL (H)). Liver Function Tests: Recent Labs  Lab 05/31/24 1729 06/01/24 0221  AST 23 16  ALT 15 13  ALKPHOS 102 91  BILITOT 1.1 1.5*  PROT 6.9 6.3*  ALBUMIN  3.9 3.7   No results for input(s): LIPASE, AMYLASE in the last 168 hours. No results for input(s): AMMONIA in the last 168 hours. Coagulation Profile: No results for input(s): INR, PROTIME in the last 168 hours. Cardiac Enzymes: No results for input(s): CKTOTAL, CKMB, CKMBINDEX, TROPONINI in the last 168 hours. BNP (last 3 results) No results for input(s): PROBNP in the last 8760 hours. HbA1C: Recent Labs    05/31/24 1729  HGBA1C 7.7*   CBG: Recent Labs  Lab 06/01/24 2100 06/02/24 0641 06/02/24 1212 06/02/24 2011 06/03/24 0615  GLUCAP 249* 217* 247* 178* 178*   Lipid Profile: No results for input(s): CHOL, HDL, LDLCALC, TRIG, CHOLHDL, LDLDIRECT in the last 72 hours. Thyroid  Function Tests: No results for  input(s): TSH, T4TOTAL, FREET4, T3FREE, THYROIDAB in the last 72 hours. Anemia Panel: No results for input(s): VITAMINB12, FOLATE, FERRITIN, TIBC, IRON , RETICCTPCT in the last 72 hours. Urine analysis:    Component Value Date/Time   COLORURINE YELLOW 06/06/2011 2300   APPEARANCEUR CLOUDY (A) 06/06/2011 2300   LABSPEC 1.021 06/06/2011 2300   PHURINE 5.0 06/06/2011 2300   GLUCOSEU NEGATIVE 06/06/2011 2300   GLUCOSEU > or = 1000 mg/dL (AA) 92/71/7990 8784   HGBUR SMALL (A) 06/06/2011 2300   HGBUR moderate 11/01/2007 0931   BILIRUBINUR NEGATIVE 06/06/2011 2300   KETONESUR NEGATIVE 06/06/2011 2300   PROTEINUR >300 (A) 06/06/2011 2300   UROBILINOGEN 0.2 06/06/2011 2300   NITRITE NEGATIVE 06/06/2011 2300   LEUKOCYTESUR SMALL (A) 06/06/2011 2300   Sepsis Labs: @LABRCNTIP (procalcitonin:4,lacticidven:4)  )No results found for this or any previous visit (from the past 240 hours).   Radiology Studies: CARDIAC CATHETERIZATION Result Date:  06/03/2024 Findings: RA = 1 RV = 44/5 PA = 43/12 (25) PCW = 7 Fick cardiac output/index = 4.6/2.4 Thermo CO/CI = 4.7/2.4 PVR = 3.9 Ao sat = 95% PA sat = 60%, 61% PAPi = 31 Assessment: 1. Much improved hemodynamics 2. Mild PAH with normal left-sided filling pressures and CO Plan/Discussion: Continue current plan. Ok for d/c today from cardiology standpoint. Daniel Bensimhon, MD 8:40 AM    Scheduled Meds:  aspirin  EC  81 mg Oral Daily   [START ON 06/04/2024] heparin  injection (subcutaneous)  5,000 Units Subcutaneous Q8H   insulin  aspart  0-6 Units Subcutaneous TID WC   insulin  aspart  2 Units Subcutaneous TID WC   insulin  glargine-yfgn  10 Units Subcutaneous Daily   pantoprazole   40 mg Oral Daily   sacubitril -valsartan   1 tablet Oral BID   sevelamer  carbonate  1,600-2,400 mg Oral TID WC   sodium chloride  flush  3 mL Intravenous Q12H   sodium chloride  flush  3 mL Intravenous Q12H   traZODone   150 mg Oral QHS   Continuous  Infusions:  sodium chloride        LOS: 3 days    Time spent:    Sigurd Pac, MD [facility]   06/03/2024, 11:44 AM

## 2024-06-04 DIAGNOSIS — N186 End stage renal disease: Secondary | ICD-10-CM | POA: Diagnosis not present

## 2024-06-04 DIAGNOSIS — T82898A Other specified complication of vascular prosthetic devices, implants and grafts, initial encounter: Secondary | ICD-10-CM

## 2024-06-04 DIAGNOSIS — Z992 Dependence on renal dialysis: Secondary | ICD-10-CM

## 2024-06-04 DIAGNOSIS — I5023 Acute on chronic systolic (congestive) heart failure: Secondary | ICD-10-CM | POA: Diagnosis not present

## 2024-06-04 LAB — BASIC METABOLIC PANEL WITH GFR
Anion gap: 17 — ABNORMAL HIGH (ref 5–15)
BUN: 54 mg/dL — ABNORMAL HIGH (ref 6–20)
CO2: 24 mmol/L (ref 22–32)
Calcium: 8.9 mg/dL (ref 8.9–10.3)
Chloride: 97 mmol/L — ABNORMAL LOW (ref 98–111)
Creatinine, Ser: 8.53 mg/dL — ABNORMAL HIGH (ref 0.61–1.24)
GFR, Estimated: 7 mL/min — ABNORMAL LOW (ref >60)
Glucose, Bld: 260 mg/dL — ABNORMAL HIGH (ref 70–99)
Potassium: 4.5 mmol/L (ref 3.5–5.1)
Sodium: 138 mmol/L (ref 135–145)

## 2024-06-04 LAB — GLUCOSE, CAPILLARY
Glucose-Capillary: 243 mg/dL — ABNORMAL HIGH (ref 70–99)
Glucose-Capillary: 236 mg/dL — ABNORMAL HIGH (ref 70–99)
Glucose-Capillary: 176 mg/dL — ABNORMAL HIGH (ref 70–99)
Glucose-Capillary: 229 mg/dL — ABNORMAL HIGH (ref 70–99)

## 2024-06-04 LAB — PHOSPHORUS: Phosphorus: 5.2 mg/dL — ABNORMAL HIGH (ref 2.5–4.6)

## 2024-06-04 MED ORDER — SEVELAMER CARBONATE 800 MG PO TABS: 1600.0000 mg | ORAL_TABLET | Freq: Two times a day (BID) | ORAL | Status: DC | PRN

## 2024-06-04 MED ORDER — POLYETHYLENE GLYCOL 3350 17 G PO PACK
17.0000 g | PACK | Freq: Every day | ORAL | Status: DC
  Administered 2024-06-04 – 2024-06-06 (×3): 17 g via ORAL
  Filled 2024-06-04 (×3): qty 1

## 2024-06-04 MED ORDER — ALUM & MAG HYDROXIDE-SIMETH 200-200-20 MG/5ML PO SUSP
30.0000 mL | ORAL | Status: DC | PRN
  Administered 2024-06-04 – 2024-06-07 (×4): 30 mL via ORAL
  Filled 2024-06-04 (×5): qty 30

## 2024-06-04 MED ORDER — SEVELAMER CARBONATE 800 MG PO TABS
2400.0000 mg | ORAL_TABLET | Freq: Three times a day (TID) | ORAL | Status: DC
  Administered 2024-06-04 – 2024-06-06 (×5): 2400 mg via ORAL
  Filled 2024-06-04 (×5): qty 3

## 2024-06-04 MED ORDER — ALUM & MAG HYDROXIDE-SIMETH 200-200-20 MG/5ML PO SUSP
15.0000 mL | Freq: Once | ORAL | Status: AC
  Administered 2024-06-04: 15 mL via ORAL
  Filled 2024-06-04: qty 30

## 2024-06-04 MED ORDER — SENNOSIDES-DOCUSATE SODIUM 8.6-50 MG PO TABS
1.0000 | ORAL_TABLET | Freq: Two times a day (BID) | ORAL | Status: DC
  Administered 2024-06-04 – 2024-06-06 (×2): 1 via ORAL
  Filled 2024-06-04 (×5): qty 1

## 2024-06-04 NOTE — Consult Note (Signed)
 Hospital Consult    Reason for Consult: Need to establish new access Referring Physician: Dr. Fairy MRN #:  [id number]  History of Present Illness This is a 58 y.o. male history of end-stage renal disease has been on dialysis via right upper arm AV fistula which has undergone multiple previous interventions and now he is having difficulty dialyzing and is very calcified.  He has been planned for left upper arm AV graft and TDC placement but due to cardiac issues he has been admitted and underwent right heart cath yesterday.  We are now consulted for dialysis access.  Past Medical History:  Diagnosis Date   Acute osteomyelitis of right foot (HCC) 05/15/2019   Anemia 06/23/2019   Aortic stenosis    Bursitis    right shoulder   Complication of anesthesia    Coronary artery disease    Diabetes with renal manifestations(250.4) 08/23/2013   Diabetic infection of right foot (HCC)    Diabetic retinopathy associated with type 2 diabetes mellitus (HCC) 06/23/2019   ESRD on hemodialysis (HCC) 07/18/2011   Essential hypertension 10/18/2007   Qualifier: Diagnosis of  By: Kassie MD, Alyce A    History of blood transfusion    HYPERLIPIDEMIA 05/16/2008   Qualifier: Diagnosis of  By: Norleen MD, [name] ORN    MRSA INFECTION 10/18/2007   Qualifier: Diagnosis of  By: Wilhemina KRAFT, Lucy     Other forms of retinal detachment(361.89) 06/23/2019   PAD (peripheral artery disease) (HCC)    PONV (postoperative nausea and vomiting)    Prolonged QT interval    Pulmonary hypertension (HCC)    Secondary hyperparathyroidism (HCC) 06/23/2019   Sepsis (HCC) 05/14/2019    Past Surgical History:  Procedure Laterality Date   A/V FISTULAGRAM N/A 12/29/2023   Procedure: A/V Fistulagram;  Surgeon: Norine Manuelita LABOR, MD;  [facility]: [facility] INVASIVE CV LAB;  Service: Cardiovascular;  Laterality: N/A;   A/V SHUNT INTERVENTION N/A 03/17/2024   Procedure: A/V SHUNT INTERVENTION;  Surgeon: Gretta Lonni PARAS, MD;  Location: [facility]  PV LAB;  Service: Cardiovascular;  Laterality: N/A;  cephalic arch   ABDOMINAL AORTOGRAM N/A 05/17/2019   Procedure: ABDOMINAL AORTOGRAM;  Surgeon: Harvey Carlin BRAVO, MD;  Location: [facility] INVASIVE CV LAB;  Service: Cardiovascular;  Laterality: N/A;   AMPUTATION TOE Right 05/14/2019   Procedure: PARTIAL AMPUTATION SECOND TOE RIGHT FOOT;  Surgeon: Gretel Ozell PARAS, DPM;  Location: [facility] OR;  Service: Podiatry;  Laterality: Right;   AMPUTATION TOE Right 05/23/2019   Procedure: AMPUTATION OF SECOND TOE METATARSAL PHALANGEAL JOINT;  Surgeon: Gretel Ozell PARAS, DPM;  Location: [facility] OR;  Service: Podiatry;  Laterality: Right;   AMPUTATION TOE Right 05/27/2019   Procedure: Amputation Toe;  Surgeon: Gretel Ozell PARAS, DPM;  [facility]: [facility] OR;  Service: Podiatry;  Laterality: Right;  right third toe   APPLICATION OF WOUND VAC Right 05/27/2019   Procedure: Application Of Wound Vac;  Surgeon: Gretel Ozell PARAS, DPM;  [facility] OR;  Service: Podiatry;  Laterality: Right;   AV FISTULA PLACEMENT  06-18-11   Right brachiocephalic AVF   BONE BIOPSY Right 05/14/2019   Procedure: OPEN SUPERFICIAL BONE BIOPSY GREAT TOE;  Surgeon: Gretel Ozell PARAS, DPM;  [facility];  Service: Podiatry;  Laterality: Right;   CARDIAC CATHETERIZATION     CORONARY BALLOON ANGIOPLASTY N/A 09/24/2020   Procedure: CORONARY BALLOON ANGIOPLASTY;  Surgeon: Swaziland, Peter M, MD;  Location: [facility] INVASIVE CV LAB;  Service: Cardiovascular;  Laterality: N/A;   CORONARY PRESSURE/FFR STUDY N/A 09/24/2020  Procedure: INTRAVASCULAR PRESSURE WIRE/FFR STUDY;  Surgeon: Swaziland, Peter M, MD;  Location: [facility] INVASIVE CV LAB;  Service: Cardiovascular;  Laterality: N/A;   CORONARY STENT INTERVENTION N/A 09/24/2020   Procedure: CORONARY STENT INTERVENTION;  Surgeon: Swaziland, Peter M, MD;  Location: [facility] INVASIVE CV LAB;  Service: Cardiovascular;  Laterality: N/A;   EYE SURGERY     left eye for Laser, diabetic retinopathy   FISTULOGRAM Right 11/06/2011   Procedure: FISTULOGRAM;   Surgeon: Redell LITTIE Door, MD;  Location: [facility] CATH LAB;  Service: Cardiovascular;  Laterality: Right;   HEMATOMA EVACUATION Right Feb. 25, 2014   HEMATOMA EVACUATION Right 12/14/2012   Procedure: EVACUATION HEMATOMA;  Surgeon: Redell LITTIE Door, MD;  Location: [facility] OR;  Service: Vascular;  Laterality: Right;   INCISION AND DRAINAGE Right 05/23/2019   Procedure: INCISION AND DRAINAGE OF RIGHT FOOT DEEP SPACE ABSCESS;  Surgeon: Gretel Ozell PARAS, DPM;  [facility];  Service: Podiatry;  Laterality: Right;   INJECTION OF SILICONE OIL Left 01/09/2023   Procedure: INJECTION OF SILICONE OIL;  Surgeon: Elner Arley LABOR, MD;  Location: [facility] OR;  Service: Ophthalmology;  Laterality: Left;   INSERTION OF DIALYSIS CATHETER  12/14/2012   Procedure: INSERTION OF DIALYSIS CATHETER;  Surgeon: Redell LITTIE Door, MD;  Location: [facility] OR;  Service: Vascular;;   INSERTION OF DIALYSIS CATHETER Right 01/07/2021   Procedure: INSERTION OF DIALYSIS CATHETER;  Surgeon: Harvey Carlin BRAVO, MD;  Location: [facility] OR;  Service: Vascular;  Laterality: Right;   INSERTION OF DIALYSIS CATHETER Left 01/08/2021   Procedure: INSERTION OF LEFT INTERNAL JUGULAR DIALYSIS CATHETER, tunneled;  Surgeon: Eliza Lonni RAMAN, MD;  Location: [facility] OR;  Service: Vascular;  Laterality: Left;   IRRIGATION AND DEBRIDEMENT FOOT Right 05/25/2019   Procedure: INCISION AND DRAINAGE FOOT;  Surgeon: Gretel Ozell PARAS, DPM;  [facility];  Service: Podiatry;  Laterality: Right;   IRRIGATION AND DEBRIDEMENT FOOT Right 05/27/2019   Procedure: IRRIGATION AND DEBRIDEMENT FOOT;  Surgeon: Gretel Ozell PARAS, DPM;  [facility];  Service: Podiatry;  Laterality: Right;   LASER PHOTO ABLATION Left 01/09/2023   Procedure: LASER PHOTO ABLATION;  Surgeon: Elner Arley LABOR, MD;  Location: [facility] OR;  Service: Ophthalmology;  Laterality: Left;   LEFT HEART CATH AND CORONARY ANGIOGRAPHY N/A 09/24/2020   Procedure: LEFT HEART CATH AND CORONARY ANGIOGRAPHY;  Surgeon: Swaziland, Peter M, MD;  Location: [facility]  INVASIVE CV LAB;  Service: Cardiovascular;  Laterality: N/A;   LOWER EXTREMITY ANGIOGRAPHY Bilateral 05/17/2019   Procedure: LOWER EXTREMITY ANGIOGRAPHY;  Surgeon: Harvey Carlin BRAVO, MD;  [facility] CV LAB;  Service: Cardiovascular;  Laterality: Bilateral;   PARS PLANA VITRECTOMY Left 01/09/2023   Procedure: PARS PLANA VITRECTOMY WITH 25 GAUGE AND REPAIR OF RETINAL DETACHMENT;  Surgeon: Elner Arley LABOR, MD;  Location: [facility] OR;  Service: Ophthalmology;  Laterality: Left;   REMOVAL OF A DIALYSIS CATHETER Right 01/08/2021   Procedure: REMOVAL OF TEMPORARY DIALYSIS CATHETER, LEFT FEMORAL VEIN;  Surgeon: Eliza Lonni RAMAN, MD;  Location: [facility] OR;  Service: Vascular;  Laterality: Right;   REVISON OF ARTERIOVENOUS FISTULA Right 12/14/2012   Procedure: REVISON OF upper arm ARTERIOVENOUS FISTULA using 47mmx10cm gortex graft;  Surgeon: Redell LITTIE Door, MD;  Location: [facility] OR;  Service: Vascular;  Laterality: Right;   REVISON OF ARTERIOVENOUS FISTULA Right 12/27/2020   Procedure: RIGHT UPPER EXTREMITY ARTERIOVENOUS FISTULA REVISiON;  Surgeon: Magda Debby SAILOR, MD;  Location[facility] OR;  Service: Vascular;  Laterality: Right;  PERIPHERAL NERVE BLOCK   RIGHT/LEFT  HEART CATH AND CORONARY ANGIOGRAPHY N/A 08/07/2020   Procedure: RIGHT/LEFT HEART CATH AND CORONARY ANGIOGRAPHY;  Surgeon: Swaziland, Peter M, MD;  Location: [facility] INVASIVE CV LAB;  Service: Cardiovascular;  Laterality: N/A;   RIGHT/LEFT HEART CATH AND CORONARY ANGIOGRAPHY N/A 05/31/2024   Procedure: RIGHT/LEFT HEART CATH AND CORONARY ANGIOGRAPHY;  Surgeon: Elmira Newman PARAS, MD;  [facility] CV LAB;  Service: Cardiovascular;  Laterality: N/A;   SOFT TISSUE MASS EXCISION     Right arm, Left leg  for MRSA infection   VENOUS ANGIOPLASTY  12/29/2023   Procedure: VENOUS ANGIOPLASTY;  Surgeon: Norine Manuelita LABOR, MD;  [facility] INVASIVE CV LAB;  Service: Cardiovascular;;  Cephalic Arch; Intragraft; Distal Edge of Stent   VENOUS ANGIOPLASTY  03/17/2024    Procedure: VENOUS ANGIOPLASTY;  Surgeon: Gretta Lonni PARAS, MD;  Location: [facility] PV LAB;  Service: Cardiovascular;;    Allergies  Allergen Reactions   Crestor [Rosuvastatin]     Other reaction(s): Muscle Pain   Fentanyl  Itching    Patient received Fentanyl  during procedure and had reaction, medicinal intervention applied.    Lipitor [Atorvastatin ]     Other reaction(s): Muscle Pain   Amoxicillin Itching    Did it involve swelling of the face/tongue/throat, SOB, or low BP? Unknown Did it involve sudden or severe rash/hives, skin peeling, or any reaction on the inside of your mouth or nose? Unknown Did you need to seek medical attention at a hospital or doctor's office? Unknown When did it last happen?      20 years ago If all above answers are NO, may proceed with cephalosporin use.    Cefazolin  Other (See Comments) and Swelling    LIP SWELLING   Chlorhexidine  Hives    Patient has blistering   Hydromorphone  Itching    Patient states he may take with benadryl . Makes feet itch.   Oxycodone  Rash and Other (See Comments)   Percocet [Oxycodone -Acetaminophen ] Itching and Other (See Comments)    Legs only. Itching and burning feeling. Can tolerate with hydroxyzine     Morphine      Other reaction(s): rash/itching All narcotics per pt   Gabapentin Nausea And Vomiting    Stomach issues     Prior to Admission medications   Medication Sig Start Date End Date Taking? Authorizing Provider  aspirin  EC 81 MG tablet Take 1 tablet (81 mg total) by mouth daily. 06/28/19  Yes Darron Deatrice LABOR, MD  EPINEPHrine  (EPIPEN  2-PAK) 0.3 mg/0.3 mL IJ SOAJ injection Inject 0.3 mg into the muscle as needed for anaphylaxis.   Yes [provider]  hydrOXYzine  (VISTARIL ) 50 MG capsule Take 50 mg by mouth every 6 (six) hours as needed for itching. 05/26/24  Yes [provider]  isosorbide  mononitrate (IMDUR ) 30 MG 24 hr tablet Take 30 mg by mouth daily. 03/29/24  Yes [provider]   metoprolol  succinate (TOPROL  XL) 100 MG 24 hr tablet Take 1 tablet (100 mg total) by mouth daily. Take with or immediately following a meal. 03/30/24  Yes Patwardhan, Manish J, MD  multivitamin (RENA-VIT) TABS tablet Take 1 tablet by mouth daily with lunch.   Yes [provider]  nitroGLYCERIN  (NITROSTAT ) 0.4 MG SL tablet Place 0.4 mg under the tongue every 5 (five) minutes as needed for chest pain.   Yes [provider]  ondansetron  (ZOFRAN -ODT) 8 MG disintegrating tablet Take 8 mg by mouth 3 (three) times daily. 05/26/24  Yes [provider]  pantoprazole  (PROTONIX ) 40 MG tablet Take 40 mg by  mouth daily.   Yes [provider]  prednisoLONE  acetate (PRED FORTE ) 1 % ophthalmic suspension Place 1 drop into the right eye daily. 12/08/23  Yes [provider]  sevelamer  carbonate (RENVELA ) 800 MG tablet Take 1,600-2,400 mg by mouth 3 (three) times daily with meals. 07/25/20  Yes [provider]  sodium zirconium cyclosilicate  (LOKELMA ) 10 g PACK packet Take 10 g by mouth every Tuesday, Thursday, Saturday, and Sunday.   Yes [provider]  Tenapanor HCl, CKD, (XPHOZAH) 30 MG TABS Take 30 mg by mouth daily as needed (when directed). 04/08/23  Yes [provider]  traZODone  (DESYREL ) 150 MG tablet Take 150 mg by mouth at bedtime. 07/15/21  Yes [provider]  triamcinolone cream (KENALOG) 0.1 % Apply 1 Application topically 2 (two) times daily as needed (ITCHING).   Yes [provider]  ZYLET  0.5-0.3 % SUSP Place 1 drop into the right eye daily. 03/23/24  Yes [provider]  Continuous Blood Gluc Sensor (FREESTYLE LIBRE 14 DAY SENSOR) MISC See admin instructions. 06/20/19   [provider]  sacubitril -valsartan  (ENTRESTO ) 24-26 MG Take 1 tablet by mouth 2 (two) times daily. START AFTER HEART CATH 05/26/24   Patwardhan, Newman PARAS, MD  loratadine  (CLARITIN ) 10 MG tablet Take 10 mg by mouth daily as needed for  allergies.  11/06/19  [provider]    Social History   Socioeconomic History   Marital status: Married    Spouse name: Not on file   Number of children: 2   Years of education: 14   Highest education level: Not on file  Occupational History   Occupation: Disabled  Tobacco Use   Smoking status: Never    Passive exposure: Past   Smokeless tobacco: Never  Vaping Use   Vaping status: Never Used  Substance and Sexual Activity   Alcohol  use: No   Drug use: No   Sexual activity: Yes  Other Topics Concern   Not on file  Social History Narrative   Not on file   Social Drivers of Health   Financial Resource Strain: Not on file  Food Insecurity: No Food Insecurity (05/31/2024)   Hunger Vital Sign    Worried About Running Out of Food in the Last Year: Never true    Ran Out of Food in the Last Year: Never true  Transportation Needs: No Transportation Needs (05/31/2024)   PRAPARE - Administrator, Civil Service (Medical): No    Lack of Transportation (Non-Medical): No  Physical Activity: Inactive (01/19/2024)   Exercise Vital Sign    Days of Exercise per Week: 0 days    Minutes of Exercise per Session: 0 min  Stress: Stress Concern Present (01/19/2024)   [employer] of Occupational Health - Occupational Stress Questionnaire    Feeling of Stress : Rather much  Social Connections: Not on file  Intimate Partner Violence: Not At Risk (05/31/2024)   Humiliation, Afraid, Rape, and Kick questionnaire    Fear of Current or Ex-Partner: No    Emotionally Abused: No    Physically Abused: No    Sexually Abused: No     Family History  Problem Relation Age of Onset   Diabetes Mother     ROS: No complaints   Physical Examination  Vitals:   06/04/24 0039 06/04/24 0500  BP: (!) 158/94 (!) 152/89  Pulse: 81 74  Resp: 19 19  Temp: (!) 97.5 F (36.4 C) (!) 97.5 F (36.4 C)  SpO2: 100%  94%   Body mass index is 22.55 kg/m.  Awake alert oriented On the  respirations Right upper arm AV fistula is heavily calcified and pulsatile Left radial pulses palpable  CBC    Component Value Date/Time   WBC 3.0 (L) 06/03/2024 0235   RBC 4.44 06/03/2024 0235   HGB 10.2 (L) 06/03/2024 0832   HGB 10.9 (L) 05/26/2024 1246   HCT 30.0 (L) 06/03/2024 0832   HCT 34.0 (L) 05/26/2024 1246   PLT 123 (L) 06/03/2024 0235   PLT 107 (L) 05/26/2024 1246   MCV 84.2 06/03/2024 0235   MCV 87 05/26/2024 1246   MCH 27.0 06/03/2024 0235   MCHC 32.1 06/03/2024 0235   RDW 14.1 06/03/2024 0235   RDW 14.7 05/26/2024 1246   LYMPHSABS 0.3 (L) 05/31/2024 1729   MONOABS 0.1 05/31/2024 1729   EOSABS 0.0 05/31/2024 1729   BASOSABS 0.0 05/31/2024 1729    BMET    Component Value Date/Time   NA 138 06/04/2024 0233   NA 137 05/26/2024 1246   K 4.5 06/04/2024 0233   CL 97 (L) 06/04/2024 0233   CO2 24 06/04/2024 0233   GLUCOSE 260 (H) 06/04/2024 0233   BUN 54 (H) 06/04/2024 0233   BUN 50 (H) 05/26/2024 1246   CREATININE 8.53 (H) 06/04/2024 0233   CREATININE 10.48 (H) 06/02/2023 1700   CALCIUM  8.9 06/04/2024 0233   CALCIUM  7.9 (L) 08/31/2009 1524   GFRNONAA 7 (L) 06/04/2024 0233   GFRAA 6 (L) 08/05/2019 0537    COAGS: Lab Results  Component Value Date   INR 1.1 09/13/2021   INR 1.14 06/18/2011   INR 1.40 06/10/2011     Non-Invasive Vascular Imaging:   Left Pre-Dialysis Findings:  +---------------------+---------+-------------------+-----------+----------  ----+  Location            PSV      Intralum. Diam.    Waveform   Comments                              (cm/s)   (cm)                                           +---------------------+---------+-------------------+-----------+----------  ----+  Brachial Antecub.    65       0.43               triphasic  calcific  walls  fossa                                                                       +---------------------+---------+-------------------+-----------+----------   ----+  Radial Art at Wrist  64       0.23               multiphasiccalcified  walls            +---------------------+---------+-------------------+-----------+----------  ----+  Ulnar Art at Wrist   67       0.12               multiphasiccalcified                                                                    walls             Summary:    Right: No obstruction visualized in the right upper extremity.  Left: No obstruction visualized in the left upper extremity.    +-----------------+-------------+----------+------------------------+  Left Cephalic    Diameter (cm)Depth (cm)        Findings          +-----------------+-------------+----------+------------------------+  Shoulder            0.23                                         +-----------------+-------------+----------+------------------------+  Prox upper arm       0.29                                         +-----------------+-------------+----------+------------------------+  Mid upper arm        0.28                                         +-----------------+-------------+----------+------------------------+  Dist upper arm       0.16                                         +-----------------+-------------+----------+------------------------+  Antecubital fossa    0.15               age-indetermine thrombus  +-----------------+-------------+----------+------------------------+  Prox forearm         0.16                                         +-----------------+-------------+----------+------------------------+  Mid forearm          0.16                                         +-----------------+-------------+----------+------------------------+  Dist forearm         0.10                                         +-----------------+-------------+----------+------------------------+   Wrist               0.09                                         +-----------------+-------------+----------+------------------------+   +-----------------+-------------+----------+--------+  Left Basilic     Diameter (cm)Depth (cm)Findings  +-----------------+-------------+----------+--------+  Shoulder            0.42                         +-----------------+-------------+----------+--------+  Prox upper arm       0.53                         +-----------------+-------------+----------+--------+  Mid upper arm        0.24                         +-----------------+-------------+----------+--------+  Dist upper arm       0.20                         +-----------------+-------------+----------+--------+  Antecubital fossa    0.20                         +-----------------+-------------+----------+--------+  Prox forearm         0.12                         +-----------------+-------------+----------+--------+  Mid forearm          0.15                         +-----------------+-------------+----------+--------+  Distal forearm       0.17                         +-----------------+-------------+----------+--------+  Wrist               0.16                         +-----------------+-------------+----------+--------+   Age-indeterminate thrombus visualized in the left cephalic vein from the  [facility] fossa to mid forearm.  Summary: Right: Right basilic vein patent with measurements as noted  above.  Left: Left basilic vein patent with measurements as noted above.        Age-indeterminate thrombus visualized in the left cephalic        vein at the [facility] fossa and mid forearm. Otherwise patent with        measurements as noted above.      ASSESSMENT/PLAN: This is a 58 y.o. male with end-stage renal disease now needs new dialysis access.  Plan will be for left arm AV fistula versus more likely graft and will need tunneled dialysis  catheter and this is scheduled for Tuesday this next week.  Okay to continue aspirin  and subcutaneous heparin .    Penne C. Sheree, MD [facility] Office: [phone] Pager: [phone]

## 2024-06-04 NOTE — Progress Notes (Signed)
 [facility] Progress Note   Subjective:   Patient seen and examined in room. Sitting in wheelchair eating breakfast.  Reports feeling a little better each day.  Working on making good choices for foods and limiting fluids.  Denies CP, SOB and edema.  Complains of itchy rash on his back that he had once before when stressed. Reports he is supposed to have his fistula worked on while admitted. Waiting to find out the plan.   Objective Vitals:   06/03/24 1941 06/03/24 2120 06/04/24 0039 06/04/24 0500  BP: (!) 117/91 (!) 144/101 (!) 158/94 (!) 152/89  Pulse: 82  81 74  Resp: 18  19 19   Temp: 98 F (36.7 C)  (!) 97.5 F (36.4 C) (!) 97.5 F (36.4 C)  TempSrc: Oral  Oral Oral  SpO2: 100%  100% 94%  Weight:    71.3 kg  Height:       Physical Exam General:chronically ill appearing male in NAD, hyperpigmented papules scattered on back Heart:RRR, no mrg Lungs:CTAB, nml WOB on RA Abdomen:NTND, soft Extremities:no LE edema, L BKA Dialysis Access: RU AVF   Filed Weights   06/02/24 1939 06/03/24 0606 06/04/24 0500  Weight: 72.2 kg 72 kg 71.3 kg    Intake/Output Summary (Last 24 hours) at 06/04/2024 1305 Last data filed at 06/03/2024 2000 Gross per 24 hour  Intake 360 ml  Output 0 ml  Net 360 ml    Additional Objective Labs: Basic Metabolic Panel: Recent Labs  Lab 06/02/24 0320 06/03/24 0235 06/03/24 0827 06/03/24 0828 06/03/24 0832 06/04/24 0233  NA 133* 139   < > 136 142 138  K 4.0 4.3   < > 4.3 3.7 4.5  CL 94* 97*  --   --   --  97*  CO2 24 27  --   --   --  24  GLUCOSE 234* 199*  --   --   --  260*  BUN 42* 35*  --   --   --  54*  CREATININE 7.64* 6.52*  --   --   --  8.53*  CALCIUM  8.9 8.9  --   --   --  8.9   < > = values in this interval not displayed.   Liver Function Tests: Recent Labs  Lab 05/31/24 1729 06/01/24 0221  AST 23 16  ALT 15 13  ALKPHOS 102 91  BILITOT 1.1 1.5*  PROT 6.9 6.3*  ALBUMIN  3.9 3.7   CBC: Recent Labs  Lab  05/31/24 1729 06/02/24 0320 06/03/24 0235 06/03/24 0827 06/03/24 0828 06/03/24 0832  WBC 3.6* 2.8* 3.0*  --   --   --   NEUTROABS 3.2  --   --   --   --   --   HGB 10.5* 11.0* 12.0* 11.6* 11.2* 10.2*  HCT 32.8* 33.0* 37.4* 34.0* 33.0* 30.0*  MCV 86.1 84.6 84.2  --   --   --   PLT 80* 154 123*  --   --   --    CBG: Recent Labs  Lab 06/03/24 0615 06/03/24 1621 06/03/24 2121 06/04/24 0621 06/04/24 1111  GLUCAP 178* 229* 246* 243* 236*   Studies/Results: CARDIAC CATHETERIZATION Result Date: 06/03/2024 Findings: RA = 1 RV = 44/5 PA = 43/12 (25) PCW = 7 Fick cardiac output/index = 4.6/2.4 Thermo CO/CI = 4.7/2.4 PVR = 3.9 Ao sat = 95% PA sat = 60%, 61% PAPi = 31 Assessment: 1. Much improved hemodynamics 2. Mild PAH with normal left-sided  filling pressures and CO Plan/Discussion: Continue current plan. Ok for d/c today from cardiology standpoint. Daniel Bensimhon, MD 8:40 AM   Medications:   aspirin  EC  81 mg Oral Daily   heparin  injection (subcutaneous)  5,000 Units Subcutaneous Q8H   insulin  aspart  0-6 Units Subcutaneous TID WC   insulin  aspart  2 Units Subcutaneous TID WC   insulin  glargine-yfgn  10 Units Subcutaneous Daily   pantoprazole   40 mg Oral Daily   polyethylene glycol  17 g Oral Daily   sacubitril -valsartan   1 tablet Oral BID   senna-docusate  1 tablet Oral BID   sevelamer  carbonate  2,400 mg Oral TID WC   sodium chloride  flush  3 mL Intravenous Q12H   sodium chloride  flush  3 mL Intravenous Q12H   traZODone   150 mg Oral QHS    Dialysis Orders: [facility]   on MWF . New EDW 72 kg HD Bath 1K/2.5Ca  Time 4:00 Heparin  none. Access RUE AVF BFR 400 DFR 800    Hectoral 3 mcg IV/HD  Assessment/Plan:  Acute on chronic HFrEF, ICM, Valvular heart disease - started on entresto  UF of 3 liters with HD.  RHC markedly improved after additional UF. Post weight 72 kg which will now be his new edw.  No BB due to low output. Heart failure clinic following.   Vascular access - has  some instent stenosis of brachial arch stent and was to have left AVG and TDC placement by Dr. Gretta, however this was postponed due to his cardiac issues.  Seen by Dr. Sheree today, plan for new access on Left on 06/07/24.   ESRD -  Last HD overnight on 8/14. No acute indications for HD today.  Next HD planned for 06/06/24.   Hypertension/volume  - BP better. HF team managing - on Entresto  49-51mg .  New EDW 72kg with improved RHC measurements.  Continue UF as tolerated.   Anemia  - Hgb 10.2. no indication for ESA at this time.   Metabolic bone disease -   Calcium  in goal. Check phos. continue with home meds  Nutrition - Currently on regular diet. Added fluid restrictions.  Monitor labs closely, if K becomes elevated will need to change to renal diet.   Manuelita Labella, PA-C [facility] 06/04/2024,1:05 PM  LOS: 4 days

## 2024-06-04 NOTE — Progress Notes (Signed)
 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 Presented for elective R/LHC as part of pre-op  evaluation for hernia surgery and fistula revision. He was admitted for optimization given h/o low-output HF, volume overload and severe pulmonary hypertension.  Right arm AV fistula complicated by pseudoaneurysm, in-stent stenosis>resulting in low flow and cannulation issues at HD. Revision surgery scheduled for Left AVG and TDC by Dr Gretta then postponed - RHC noted severe pulmonary hypertension, reduced cardiac output,'s SVR severely elevated at> 2400, LHC with severe multivessel CAD -sp multiple dialysis treatments - Repeat right heart cath with significantly improved PA pressures and hemodynamics - 8/15, discussed with vascular surgery regarding access  Subjective: - Feels okay overall, took insulin  this morning  Assessment and Plan:  Acute on chronic systolic heart failure (HCC) Severe pulmonary hypertension Valvular heart disease, severe AS, moderate MR, severe TR -echo 04/2024 with new systolic findings of EF of 30-35%.  RV preserved moderate to severe MAC with mild to moderate MR, severe TR, low-flow low gradient moderate to severe aortic stenosis LHC 8/12 with findings of severe multivessel CAD with occluded prior stents in the LAD and left circumflex, RHC RA 22, PA 109/56, wedge 35, cardiac index 1.6, SVR greater than 2400. Decompensated ischemic CM. Severe pulmonary HTN - Significant improvement after repeat HD last night, repeat RHC with normal filling pressures and improved PA pressures - Remains on Entresto , blood pressure stable on this regimen  Complication of AV dialysis fistula -He has persistent pseudoaneurysm, low access flow to his right arm AV fistula. He has had revision with previous  interposition and multiple fistulogram's most recent 03/17/24.  -Plan is for new access. His vein mapping in the left arm shows small surface veins so likely would require graft with TDC placement. -Revision surgery scheduled for 05/30/24 by Dr Gretta postponed.  -d/w VVS re: access  Prolonged QT interval  Essential hypertension Meds as above  ESRD on dialysis [facility]) hyperkalemia Last HD yesterday, potassium improved and monitor while on Entresto   CAD (coronary artery disease) -LHC 8/12: Severe multivessel CAD with occluded prior stents in the LAD and L CFX territory supplied by these vessels is largely infarcted based on stress test. Therefore, I do not think he will benefit from revascularization to these vessels. -continue medical management  -statin intolerant  PAD (peripheral artery disease) (HCC) Left BKA, right TMA Continue ASA  Statin intolerant   Dyslipidemia Statin intolerant   Type 2 diabetes mellitus (HCC) A1c is 7.7 CBGs elevated, refuses insulin   Iron  deficiency anemia, unspecified Cbc pending  Baseline hgb around 10    DVT prophylaxis: Hep SQ Code Status: Full Code Family Communication:none present Disposition Plan: none present  Consultants:    Procedures:   Antimicrobials:    Objective: Vitals:   06/03/24 1941 06/03/24 2120 06/04/24 0039 06/04/24 0500  BP: (!) 117/91 (!) 144/101 (!) 158/94 (!) 152/89  Pulse: 82  81 74  Resp: 18  19 19   Temp: 98 F (36.7 C)  (!) 97.5 F (36.4 C) (!) 97.5 F (36.4 C)  TempSrc: Oral  Oral Oral  SpO2: 100%  100% 94%  Weight:    71.3 kg  Height:        Intake/Output Summary (Last 24 hours) at 06/04/2024 1055 Last data filed at 06/03/2024 2000 Gross per  24 hour  Intake 360 ml  Output 0 ml  Net 360 ml   Filed Weights   06/02/24 1939 06/03/24 0606 06/04/24 0500  Weight: 72.2 kg 72 kg 71.3 kg    Examination:  General exam: Chronically ill-appearing HEENT: no JVD Respiratory system:  Clear Cardiovascular system: S1 & S2 heard, RRR.  Systolic murmur Abd: nondistended, soft and nontender.Normal bowel sounds heard. Central nervous system: Alert and oriented. No focal neurological deficits. Extremities: Left BKA, right TMA Skin: No rashes Psychiatry: flat affect    Data Reviewed:   CBC: Recent Labs  Lab 05/31/24 1729 06/02/24 0320 06/03/24 0235 06/03/24 0827 06/03/24 0828 06/03/24 0832  WBC 3.6* 2.8* 3.0*  --   --   --   NEUTROABS 3.2  --   --   --   --   --   HGB 10.5* 11.0* 12.0* 11.6* 11.2* 10.2*  HCT 32.8* 33.0* 37.4* 34.0* 33.0* 30.0*  MCV 86.1 84.6 84.2  --   --   --   PLT 80* 154 123*  --   --   --    Basic Metabolic Panel: Recent Labs  Lab 05/31/24 1729 06/01/24 0221 06/02/24 0320 06/03/24 0235 06/03/24 0827 06/03/24 0828 06/03/24 0832 06/04/24 0233  NA 134* 134* 133* 139 137 136 142 138  K 6.2* 6.0* 4.0 4.3 4.3 4.3 3.7 4.5  CL 98 100 94* 97*  --   --   --  97*  CO2 20* 16* 24 27  --   --   --  24  GLUCOSE 206* 251* 234* 199*  --   --   --  260*  BUN 43* 51* 42* 35*  --   --   --  54*  CREATININE 8.89* 9.44* 7.64* 6.52*  --   --   --  8.53*  CALCIUM  9.4 9.2 8.9 8.9  --   --   --  8.9  MG 2.5*  --   --   --   --   --   --   --    GFR: Estimated Creatinine Clearance: 9.5 mL/min (A) (by C-G formula based on SCr of 8.53 mg/dL (H)). Liver Function Tests: Recent Labs  Lab 05/31/24 1729 06/01/24 0221  AST 23 16  ALT 15 13  ALKPHOS 102 91  BILITOT 1.1 1.5*  PROT 6.9 6.3*  ALBUMIN  3.9 3.7   No results for input(s): LIPASE, AMYLASE in the last 168 hours. No results for input(s): AMMONIA in the last 168 hours. Coagulation Profile: No results for input(s): INR, PROTIME in the last 168 hours. Cardiac Enzymes: No results for input(s): CKTOTAL, CKMB, CKMBINDEX, TROPONINI in the last 168 hours. BNP (last 3 results) No results for input(s): PROBNP in the last 8760 hours. HbA1C: No results for input(s): HGBA1C in the  last 72 hours.  CBG: Recent Labs  Lab 06/02/24 2011 06/03/24 0615 06/03/24 1621 06/03/24 2121 06/04/24 0621  GLUCAP 178* 178* 229* 246* 243*   Lipid Profile: No results for input(s): CHOL, HDL, LDLCALC, TRIG, CHOLHDL, LDLDIRECT in the last 72 hours. Thyroid  Function Tests: No results for input(s): TSH, T4TOTAL, FREET4, T3FREE, THYROIDAB in the last 72 hours. Anemia Panel: No results for input(s): VITAMINB12, FOLATE, FERRITIN, TIBC, IRON , RETICCTPCT in the last 72 hours. Urine analysis:    Component Value Date/Time   COLORURINE YELLOW 06/06/2011 2300   APPEARANCEUR CLOUDY (A) 06/06/2011 2300   LABSPEC 1.021 06/06/2011 2300   PHURINE 5.0 06/06/2011 2300   GLUCOSEU NEGATIVE 06/06/2011 2300  GLUCOSEU > or = 1000 mg/dL (AA) 92/71/7990 8784   HGBUR SMALL (A) 06/06/2011 2300   HGBUR moderate 11/01/2007 0931   BILIRUBINUR NEGATIVE 06/06/2011 2300   KETONESUR NEGATIVE 06/06/2011 2300   PROTEINUR >300 (A) 06/06/2011 2300   UROBILINOGEN 0.2 06/06/2011 2300   NITRITE NEGATIVE 06/06/2011 2300   LEUKOCYTESUR SMALL (A) 06/06/2011 2300   Sepsis Labs: @LABRCNTIP (procalcitonin:4,lacticidven:4)  )No results found for this or any previous visit (from the past 240 hours).   Radiology Studies: CARDIAC CATHETERIZATION Result Date: 06/03/2024 Findings: RA = 1 RV = 44/5 PA = 43/12 (25) PCW = 7 Fick cardiac output/index = 4.6/2.4 Thermo CO/CI = 4.7/2.4 PVR = 3.9 Ao sat = 95% PA sat = 60%, 61% PAPi = 31 Assessment: 1. Much improved hemodynamics 2. Mild PAH with normal left-sided filling pressures and CO Plan/Discussion: Continue current plan. Ok for d/c today from cardiology standpoint. Daniel Bensimhon, MD 8:40 AM    Scheduled Meds:  aspirin  EC  81 mg Oral Daily   heparin  injection (subcutaneous)  5,000 Units Subcutaneous Q8H   insulin  aspart  0-6 Units Subcutaneous TID WC   insulin  aspart  2 Units Subcutaneous TID WC   insulin  glargine-yfgn  10 Units  Subcutaneous Daily   pantoprazole   40 mg Oral Daily   polyethylene glycol  17 g Oral Daily   sacubitril -valsartan   1 tablet Oral BID   senna-docusate  1 tablet Oral BID   sevelamer  carbonate  2,400 mg Oral TID WC   sodium chloride  flush  3 mL Intravenous Q12H   sodium chloride  flush  3 mL Intravenous Q12H   traZODone   150 mg Oral QHS   Continuous Infusions:     LOS: 4 days    Time spent:    Sigurd Pac, MD [facility]   06/04/2024, 10:55 AM

## 2024-06-05 ENCOUNTER — Encounter (HOSPITAL_COMMUNITY): Payer: Self-pay | Admitting: Internal Medicine

## 2024-06-05 DIAGNOSIS — I5023 Acute on chronic systolic (congestive) heart failure: Secondary | ICD-10-CM | POA: Diagnosis not present

## 2024-06-05 LAB — GLUCOSE, CAPILLARY
Glucose-Capillary: 213 mg/dL — ABNORMAL HIGH (ref 70–99)
Glucose-Capillary: 139 mg/dL — ABNORMAL HIGH (ref 70–99)
Glucose-Capillary: 167 mg/dL — ABNORMAL HIGH (ref 70–99)
Glucose-Capillary: 197 mg/dL — ABNORMAL HIGH (ref 70–99)

## 2024-06-05 LAB — BASIC METABOLIC PANEL WITH GFR
Anion gap: 16 — ABNORMAL HIGH (ref 5–15)
BUN: 71 mg/dL — ABNORMAL HIGH (ref 6–20)
CO2: 23 mmol/L (ref 22–32)
Calcium: 9.1 mg/dL (ref 8.9–10.3)
Chloride: 98 mmol/L (ref 98–111)
Creatinine, Ser: 9.95 mg/dL — ABNORMAL HIGH (ref 0.61–1.24)
GFR, Estimated: 6 mL/min — ABNORMAL LOW (ref >60)
Glucose, Bld: 293 mg/dL — ABNORMAL HIGH (ref 70–99)
Potassium: 4.8 mmol/L (ref 3.5–5.1)
Sodium: 137 mmol/L (ref 135–145)

## 2024-06-05 MED ORDER — LACTULOSE 10 GM/15ML PO SOLN
20.0000 g | Freq: Two times a day (BID) | ORAL | Status: DC
  Administered 2024-06-05: 20 g via ORAL
  Filled 2024-06-05 (×4): qty 30

## 2024-06-05 MED ORDER — BISACODYL 10 MG RE SUPP
10.0000 mg | Freq: Once | RECTAL | Status: AC
  Administered 2024-06-05: 10 mg via RECTAL
  Filled 2024-06-05: qty 1

## 2024-06-05 MED ORDER — INSULIN ASPART 100 UNIT/ML IJ SOLN
4.0000 [IU] | Freq: Three times a day (TID) | INTRAMUSCULAR | Status: DC
  Administered 2024-06-06 – 2024-06-07 (×2): 4 [IU] via SUBCUTANEOUS

## 2024-06-05 MED ORDER — DIPHENHYDRAMINE HCL 25 MG PO CAPS
25.0000 mg | ORAL_CAPSULE | Freq: Once | ORAL | Status: AC
  Administered 2024-06-05: 25 mg via ORAL
  Filled 2024-06-05: qty 1

## 2024-06-05 NOTE — Progress Notes (Signed)
 [facility] Progress Note   Subjective:   Seen and examined in room.  Sitting in wheelchair.  No specific complaints.  Denies chest pain and shortness of breath. No acute events overnight.   Objective Vitals:   06/04/24 1936 06/04/24 2330 06/05/24 0415 06/05/24 1100  BP: (!) 147/86 (!) 166/97 136/88 (!) 157/85  Pulse: 86 84 79 86  Resp: 18 18 16 20   Temp: 97.9 F (36.6 C) 97.8 F (36.6 C) 98 F (36.7 C) 97.8 F (36.6 C)  TempSrc: Oral Oral Oral Oral  SpO2: 100% 100% 96% 100%  Weight:   74.2 kg   Height:       Physical Exam General:chronically ill appearing male in NAD Heart:RRR Lungs:CTAB, nml WOB on RA Abdomen:soft, NTND Extremities:no LE edema, L BKA Dialysis Access: RU AVF +b/t   Filed Weights   06/03/24 0606 06/04/24 0500 06/05/24 0415  Weight: 72 kg 71.3 kg 74.2 kg    Intake/Output Summary (Last 24 hours) at 06/05/2024 1337 Last data filed at 06/05/2024 0900 Gross per 24 hour  Intake 360 ml  Output --  Net 360 ml    Additional Objective Labs: Basic Metabolic Panel: Recent Labs  Lab 06/03/24 0235 06/03/24 0827 06/03/24 0832 06/04/24 0233 06/05/24 0240  NA 139   < > 142 138 137  K 4.3   < > 3.7 4.5 4.8  CL 97*  --   --  97* 98  CO2 27  --   --  24 23  GLUCOSE 199*  --   --  260* 293*  BUN 35*  --   --  54* 71*  CREATININE 6.52*  --   --  8.53* 9.95*  CALCIUM  8.9  --   --  8.9 9.1  PHOS  --   --   --  5.2*  --    < > = values in this interval not displayed.   Liver Function Tests: Recent Labs  Lab 05/31/24 1729 06/01/24 0221  AST 23 16  ALT 15 13  ALKPHOS 102 91  BILITOT 1.1 1.5*  PROT 6.9 6.3*  ALBUMIN  3.9 3.7   CBC: Recent Labs  Lab 05/31/24 1729 06/02/24 0320 06/03/24 0235 06/03/24 0827 06/03/24 0828 06/03/24 0832  WBC 3.6* 2.8* 3.0*  --   --   --   NEUTROABS 3.2  --   --   --   --   --   HGB 10.5* 11.0* 12.0* 11.6* 11.2* 10.2*  HCT 32.8* 33.0* 37.4* 34.0* 33.0* 30.0*  MCV 86.1 84.6 84.2  --   --   --   PLT  80* 154 123*  --   --   --    Medications:   aspirin  EC  81 mg Oral Daily   heparin  injection (subcutaneous)  5,000 Units Subcutaneous Q8H   insulin  aspart  0-6 Units Subcutaneous TID WC   insulin  aspart  4 Units Subcutaneous TID WC   insulin  glargine-yfgn  10 Units Subcutaneous Daily   pantoprazole   40 mg Oral Daily   polyethylene glycol  17 g Oral Daily   senna-docusate  1 tablet Oral BID   sevelamer  carbonate  2,400 mg Oral TID WC   sodium chloride  flush  3 mL Intravenous Q12H   sodium chloride  flush  3 mL Intravenous Q12H   traZODone   150 mg Oral QHS    Dialysis Orders: [facility]   on MWF . New EDW 72 kg HD Bath 1K/2.5Ca  Time 4:00 Heparin  none. Access RUE  AVF BFR 400 DFR 800    Hectoral 3 mcg IV/HD   Assessment/Plan:  Acute on chronic HFrEF, ICM, Valvular heart disease - started on entresto .   RHC markedly improved after additional UF. Post weight 72 kg which will now be his new edw.  No BB due to low output. Heart failure clinic following.   Vascular access - has some instent stenosis of brachial arch stent and was to have left AVG and TDC placement by Dr. Gretta, however this was postponed due to his cardiac issues.  Seen by Dr. Sheree, plan for new access on Left on 06/07/24.   ESRD -  Last HD overnight on 8/14. No acute indications for HD today.  Next HD planned for 06/06/24 using RU AVF.   Hypertension/volume  - BP better. HF team managing - on Entresto  49-51mg .  New EDW 72kg with improved RHC measurements.  Continue UF as tolerated.   Anemia  - Hgb 10.2. no indication for ESA at this time.   Metabolic bone disease -   Calcium  and phos in goal. continue with home meds  Nutrition - Currently on regular diet. Added fluid restrictions.  Monitor labs closely, if K becomes elevated will need to change to renal diet.   Manuelita Labella, PA-C [facility] 06/05/2024,1:37 PM  LOS: 5 days

## 2024-06-05 NOTE — Progress Notes (Signed)
 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 Presented for elective R/LHC as part of pre-op  evaluation for hernia surgery and fistula revision. He was admitted for optimization given h/o low-output HF, volume overload and severe pulmonary hypertension.  Right arm AV fistula complicated by pseudoaneurysm, in-stent stenosis>resulting in low flow and cannulation issues at HD. Revision surgery scheduled for Left AVG and TDC by Dr Gretta then postponed - RHC noted severe pulmonary hypertension, reduced cardiac output,'s SVR severely elevated at> 2400, LHC with severe multivessel CAD -sp multiple dialysis treatments - Repeat right heart cath with significantly improved PA pressures and hemodynamics - 8/15, discussed with vascular surgery regarding access  Subjective: - Developed some lip swelling, concerned that Entresto  is contributing  Assessment and Plan:  Acute on chronic systolic heart failure (HCC) Severe pulmonary hypertension Valvular heart disease, severe AS, moderate MR, severe TR -echo 04/2024 with new systolic findings of EF of 30-35%.  RV preserved moderate to severe MAC with mild to moderate MR, severe TR, low-flow low gradient moderate to severe aortic stenosis LHC 8/12 with findings of severe multivessel CAD with occluded prior stents in the LAD and left circumflex, RHC RA 22, PA 109/56, wedge 35, cardiac index 1.6, SVR greater than 2400. Decompensated ischemic CM. Severe pulmonary HTN - Significant improvement after repeat HD last night, repeat RHC with normal filling pressures and improved PA pressures - Hold Entresto  considering swelling in his lips  Complication of AV dialysis fistula -He has persistent pseudoaneurysm, low access flow to his right arm AV fistula. He has had revision with previous  interposition and multiple fistulogram's most recent 03/17/24.  -Plan is for new access. His vein mapping in the left arm shows small surface veins so likely would require graft with TDC placement. -Revision surgery scheduled for 05/30/24 by Dr Gretta postponed.  -d/w VVS re: access, plan for left arm AVF and tunneled dialysis catheter on Tuesday  Prolonged QT interval  Essential hypertension Meds as above  ESRD on dialysis (HCC) hyperkalemia Last HD yesterday, potassium improved and monitor while on Entresto   CAD (coronary artery disease) -LHC 8/12: Severe multivessel CAD with occluded prior stents in the LAD and L CFX territory supplied by these vessels is largely infarcted based on stress test. Therefore, I do not think he will benefit from revascularization to these vessels. -continue medical management  -statin intolerant  PAD (peripheral artery disease) (HCC) Left BKA, right TMA Continue ASA  Statin intolerant   Dyslipidemia Statin intolerant   Type 2 diabetes mellitus (HCC) A1c is 7.7 CBGs elevated, refuses insulin   Iron  deficiency anemia, unspecified Baseline hgb around 10    DVT prophylaxis: Hep SQ Code Status: Full Code Family Communication:none present Disposition Plan: none present  Consultants:    Procedures:   Antimicrobials:    Objective: Vitals:   06/04/24 1406 06/04/24 1936 06/04/24 2330 06/05/24 0415  BP: (!) 176/101 (!) 147/86 (!) 166/97 136/88  Pulse: 93 86 84 79  Resp: 18 18 18 16   Temp: 97.7 F (36.5 C) 97.9 F (36.6 C) 97.8 F (36.6 C) 98 F (36.7 C)  TempSrc:  Oral Oral Oral  SpO2: 100% 100% 100% 96%  Weight:    74.2 kg  Height:        Intake/Output Summary (Last 24 hours) at 06/05/2024 1044  Last data filed at 06/05/2024 0900 Gross per 24 hour  Intake 360 ml  Output --  Net 360 ml   Filed Weights   06/03/24 0606 06/04/24 0500 06/05/24 0415  Weight: 72 kg 71.3 kg 74.2 kg    Examination:  General exam: Chronically  ill-appearing, AAO x 3, no distress HEENT: no JVD, mild lower lip swelling Respiratory system: Clear Cardiovascular system: S1 & S2 heard, RRR.  Systolic murmur Abd: nondistended, soft and nontender.Normal bowel sounds heard. Central nervous system: Alert and oriented. No focal neurological deficits. Extremities: Left BKA, right TMA Skin: No rashes Psychiatry: flat affect    Data Reviewed:   CBC: Recent Labs  Lab 05/31/24 1729 06/02/24 0320 06/03/24 0235 06/03/24 0827 06/03/24 0828 06/03/24 0832  WBC 3.6* 2.8* 3.0*  --   --   --   NEUTROABS 3.2  --   --   --   --   --   HGB 10.5* 11.0* 12.0* 11.6* 11.2* 10.2*  HCT 32.8* 33.0* 37.4* 34.0* 33.0* 30.0*  MCV 86.1 84.6 84.2  --   --   --   PLT 80* 154 123*  --   --   --    Basic Metabolic Panel: Recent Labs  Lab 05/31/24 1729 06/01/24 0221 06/02/24 0320 06/03/24 0235 06/03/24 0827 06/03/24 0828 06/03/24 0832 06/04/24 0233 06/05/24 0240  NA 134* 134* 133* 139 137 136 142 138 137  K 6.2* 6.0* 4.0 4.3 4.3 4.3 3.7 4.5 4.8  CL 98 100 94* 97*  --   --   --  97* 98  CO2 20* 16* 24 27  --   --   --  24 23  GLUCOSE 206* 251* 234* 199*  --   --   --  260* 293*  BUN 43* 51* 42* 35*  --   --   --  54* 71*  CREATININE 8.89* 9.44* 7.64* 6.52*  --   --   --  8.53* 9.95*  CALCIUM  9.4 9.2 8.9 8.9  --   --   --  8.9 9.1  MG 2.5*  --   --   --   --   --   --   --   --   PHOS  --   --   --   --   --   --   --  5.2*  --    GFR: Estimated Creatinine Clearance: 8.4 mL/min (A) (by C-G formula based on SCr of 9.95 mg/dL (H)). Liver Function Tests: Recent Labs  Lab 05/31/24 1729 06/01/24 0221  AST 23 16  ALT 15 13  ALKPHOS 102 91  BILITOT 1.1 1.5*  PROT 6.9 6.3*  ALBUMIN  3.9 3.7   No results for input(s): LIPASE, AMYLASE in the last 168 hours. No results for input(s): AMMONIA in the last 168 hours. Coagulation Profile: No results for input(s): INR, PROTIME in the last 168 hours. Cardiac Enzymes: No results for  input(s): CKTOTAL, CKMB, CKMBINDEX, TROPONINI in the last 168 hours. BNP (last 3 results) No results for input(s): PROBNP in the last 8760 hours. HbA1C: No results for input(s): HGBA1C in the last 72 hours.  CBG: Recent Labs  Lab 06/04/24 0621 06/04/24 1111 06/04/24 1557 06/04/24 2133 06/05/24 0644  GLUCAP 243* 236* 176* 229* 213*   Lipid Profile: No results for input(s): CHOL, HDL, LDLCALC, TRIG, CHOLHDL, LDLDIRECT in the last 72 hours. Thyroid  Function Tests: No results for input(s): TSH, T4TOTAL, FREET4, T3FREE, THYROIDAB in the last 72 hours.  Anemia Panel: No results for input(s): VITAMINB12, FOLATE, FERRITIN, TIBC, IRON , RETICCTPCT in the last 72 hours. Urine analysis:    Component Value Date/Time   COLORURINE YELLOW 06/06/2011 2300   APPEARANCEUR CLOUDY (A) 06/06/2011 2300   LABSPEC 1.021 06/06/2011 2300   PHURINE 5.0 06/06/2011 2300   GLUCOSEU NEGATIVE 06/06/2011 2300   GLUCOSEU > or = 1000 mg/dL (AA) 92/71/7990 8784   HGBUR SMALL (A) 06/06/2011 2300   HGBUR moderate 11/01/2007 0931   BILIRUBINUR NEGATIVE 06/06/2011 2300   KETONESUR NEGATIVE 06/06/2011 2300   PROTEINUR >300 (A) 06/06/2011 2300   UROBILINOGEN 0.2 06/06/2011 2300   NITRITE NEGATIVE 06/06/2011 2300   LEUKOCYTESUR SMALL (A) 06/06/2011 2300   Sepsis Labs: @LABRCNTIP (procalcitonin:4,lacticidven:4)  )No results found for this or any previous visit (from the past 240 hours).   Radiology Studies: No results found.    Scheduled Meds:  aspirin  EC  81 mg Oral Daily   heparin  injection (subcutaneous)  5,000 Units Subcutaneous Q8H   insulin  aspart  0-6 Units Subcutaneous TID WC   insulin  aspart  4 Units Subcutaneous TID WC   insulin  glargine-yfgn  10 Units Subcutaneous Daily   pantoprazole   40 mg Oral Daily   polyethylene glycol  17 g Oral Daily   senna-docusate  1 tablet Oral BID   sevelamer  carbonate  2,400 mg Oral TID WC   sodium chloride  flush  3  mL Intravenous Q12H   sodium chloride  flush  3 mL Intravenous Q12H   traZODone   150 mg Oral QHS   Continuous Infusions:     LOS: 5 days    Time spent:    Sigurd Pac, MD [facility]   06/05/2024, 10:44 AM

## 2024-06-05 NOTE — Plan of Care (Signed)
  Problem: Education: Goal: Knowledge of General Education information will improve Description: Including pain rating scale, medication(s)/side effects and non-pharmacologic comfort measures Outcome: Progressing   Problem: Clinical Measurements: Goal: Cardiovascular complication will be avoided Outcome: Progressing   Problem: Elimination: Goal: Will not experience complications related to bowel motility Outcome: Progressing

## 2024-06-05 NOTE — Progress Notes (Signed)
 Pt c/o chest pain to the center/left area of chest. Described as tight pressure. EKG was obtained, results uploaded to chart. BP at 16:41 146/94 1 nitro given. MD made aware of the situation, advised to give 2nd dose of nitroglycerin . 16:46 bp 138/86. Pt stated CP is mostly gone, not as intense as it was. BP at 16:51 128/81.

## 2024-06-05 NOTE — Plan of Care (Signed)
  Problem: Education: Goal: Understanding of CV disease, CV risk reduction, and recovery process will improve Outcome: Progressing Goal: Individualized Educational Video(s) Outcome: Progressing   Problem: Activity: Goal: Ability to return to baseline activity level will improve Outcome: Progressing   Problem: Cardiovascular: Goal: Ability to achieve and maintain adequate cardiovascular perfusion will improve Outcome: Progressing Goal: Vascular access site(s) Level 0-1 will be maintained Outcome: Progressing   Problem: Health Behavior/Discharge Planning: Goal: Ability to safely manage health-related needs after discharge will improve Outcome: Progressing   Problem: Education: Goal: Knowledge of General Education information will improve Description: Including pain rating scale, medication(s)/side effects and non-pharmacologic comfort measures Outcome: Progressing   Problem: Health Behavior/Discharge Planning: Goal: Ability to manage health-related needs will improve Outcome: Progressing   Problem: Clinical Measurements: Goal: Ability to maintain clinical measurements within normal limits will improve Outcome: Progressing Goal: Will remain free from infection Outcome: Progressing Goal: Diagnostic test results will improve Outcome: Progressing Goal: Respiratory complications will improve Outcome: Progressing Goal: Cardiovascular complication will be avoided Outcome: Progressing   Problem: Activity: Goal: Risk for activity intolerance will decrease Outcome: Progressing   Problem: Nutrition: Goal: Adequate nutrition will be maintained Outcome: Progressing   Problem: Coping: Goal: Level of anxiety will decrease Outcome: Progressing   Problem: Elimination: Goal: Will not experience complications related to bowel motility Outcome: Progressing Goal: Will not experience complications related to urinary retention Outcome: Progressing   Problem: Pain Managment: Goal:  General experience of comfort will improve and/or be controlled Outcome: Progressing   Problem: Safety: Goal: Ability to remain free from injury will improve Outcome: Progressing   Problem: Skin Integrity: Goal: Risk for impaired skin integrity will decrease Outcome: Progressing   Problem: Education: Goal: Ability to describe self-care measures that may prevent or decrease complications (Diabetes Survival Skills Education) will improve Outcome: Progressing Goal: Individualized Educational Video(s) Outcome: Progressing   Problem: Coping: Goal: Ability to adjust to condition or change in health will improve Outcome: Progressing   Problem: Fluid Volume: Goal: Ability to maintain a balanced intake and output will improve Outcome: Progressing   Problem: Health Behavior/Discharge Planning: Goal: Ability to identify and utilize available resources and services will improve Outcome: Progressing Goal: Ability to manage health-related needs will improve Outcome: Progressing   Problem: Metabolic: Goal: Ability to maintain appropriate glucose levels will improve Outcome: Progressing   Problem: Nutritional: Goal: Maintenance of adequate nutrition will improve Outcome: Progressing Goal: Progress toward achieving an optimal weight will improve Outcome: Progressing   Problem: Skin Integrity: Goal: Risk for impaired skin integrity will decrease Outcome: Progressing   Problem: Tissue Perfusion: Goal: Adequacy of tissue perfusion will improve Outcome: Progressing

## 2024-06-06 DIAGNOSIS — I5023 Acute on chronic systolic (congestive) heart failure: Secondary | ICD-10-CM | POA: Diagnosis not present

## 2024-06-06 LAB — BASIC METABOLIC PANEL WITH GFR
Anion gap: 15 (ref 5–15)
BUN: 83 mg/dL — ABNORMAL HIGH (ref 6–20)
CO2: 23 mmol/L (ref 22–32)
Calcium: 9.4 mg/dL (ref 8.9–10.3)
Chloride: 97 mmol/L — ABNORMAL LOW (ref 98–111)
Creatinine, Ser: 11.17 mg/dL — ABNORMAL HIGH (ref 0.61–1.24)
GFR, Estimated: 5 mL/min — ABNORMAL LOW (ref >60)
Glucose, Bld: 145 mg/dL — ABNORMAL HIGH (ref 70–99)
Potassium: 5.6 mmol/L — ABNORMAL HIGH (ref 3.5–5.1)
Sodium: 135 mmol/L (ref 135–145)

## 2024-06-06 LAB — GLUCOSE, CAPILLARY
Glucose-Capillary: 116 mg/dL — ABNORMAL HIGH (ref 70–99)
Glucose-Capillary: 107 mg/dL — ABNORMAL HIGH (ref 70–99)
Glucose-Capillary: 97 mg/dL (ref 70–99)

## 2024-06-06 LAB — CBC
HCT: 30.7 % — ABNORMAL LOW (ref 39.0–52.0)
Hemoglobin: 9.9 g/dL — ABNORMAL LOW (ref 13.0–17.0)
MCH: 27 pg (ref 26.0–34.0)
MCHC: 32.2 g/dL (ref 30.0–36.0)
MCV: 83.7 fL (ref 80.0–100.0)
Platelets: 129 K/uL — ABNORMAL LOW (ref 150–400)
RBC: 3.67 MIL/uL — ABNORMAL LOW (ref 4.22–5.81)
RDW: 14.5 % (ref 11.5–15.5)
WBC: 4.1 K/uL (ref 4.0–10.5)
nRBC: 0 % (ref 0.0–0.2)

## 2024-06-06 MED ORDER — ANTICOAGULANT SODIUM CITRATE 4% (200MG/5ML) IV SOLN: 5.0000 mL | Status: DC | PRN

## 2024-06-06 MED ORDER — LIDOCAINE HCL (PF) 1 % IJ SOLN
5.0000 mL | INTRAMUSCULAR | Status: DC | PRN
Start: 2024-06-06 — End: 2024-06-06

## 2024-06-06 MED ORDER — LIDOCAINE-PRILOCAINE 2.5-2.5 % EX CREA: 1.0000 | TOPICAL_CREAM | CUTANEOUS | Status: DC | PRN

## 2024-06-06 MED ORDER — ALTEPLASE 2 MG IJ SOLR: 2.0000 mg | Freq: Once | INTRAMUSCULAR | Status: DC | PRN

## 2024-06-06 MED ORDER — SEVELAMER CARBONATE 800 MG PO TABS
1600.0000 mg | ORAL_TABLET | Freq: Three times a day (TID) | ORAL | Status: DC
  Administered 2024-06-07: 1600 mg via ORAL
  Filled 2024-06-06 (×3): qty 2

## 2024-06-06 NOTE — Progress Notes (Signed)
  Progress Note    06/06/2024 7:45 AM 3 Days Post-Op  ESRD in need of new access Scheduled for OR tomorrow for [facility] insertion and left AV graft with Dr. Pearline He did not have any questions regarding surgery NPO after midnight Consent ordered   Teretha Damme, PA-C [facility] [phone] 06/06/2024 7:45 AM

## 2024-06-06 NOTE — Progress Notes (Signed)
 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 Presented for elective R/LHC as part of pre-op  evaluation for hernia surgery and fistula revision. He was admitted for optimization given h/o low-output HF, volume overload and severe pulmonary hypertension.  Right arm AV fistula complicated by pseudoaneurysm, in-stent stenosis>resulting in low flow and cannulation issues at HD. Revision surgery scheduled for Left AVG and TDC by Dr Gretta then postponed - RHC noted severe pulmonary hypertension, reduced cardiac output,'s SVR severely elevated at> 2400, LHC with severe multivessel CAD -sp multiple dialysis treatments - Repeat right heart cath with significantly improved PA pressures and hemodynamics - 8/15, discussed with vascular surgery regarding access - 8/16 developed lip swelling, concerned about Entresto , dose held  Subjective: - Mild persistent lip swelling, no other symptoms today, denies dyspnea, had chest pressure yesterday  Assessment and Plan:  Acute on chronic systolic heart failure (HCC) Severe pulmonary hypertension Valvular heart disease, severe AS, moderate MR, severe TR -echo 04/2024 with new systolic findings of EF of 30-35%.  RV preserved moderate to severe MAC with mild to moderate MR, severe TR, low-flow low gradient moderate to severe aortic stenosis LHC 8/12 with findings of severe multivessel CAD with occluded prior stents in the LAD and left circumflex, RHC RA 22, PA 109/56, wedge 35, cardiac index 1.6, SVR greater than 2400. Decompensated ischemic CM. Severe pulmonary HTN - Significant improvement after repeat HD last night, repeat RHC with normal filling pressures and improved PA pressures - Hold Entresto  considering swelling in his lips - Plan for DC home after left arm AV graft and TDC  placement, patient insists on staying in hospital till procedure completed  Complication of AV dialysis fistula -He has persistent pseudoaneurysm, low access flow to his right arm AV fistula. He has had revision with previous interposition and multiple fistulogram's most recent 03/17/24.  -Plan is for new access. His vein mapping in the left arm shows small surface veins so likely would require graft with TDC placement. -Revision surgery scheduled for 05/30/24 by Dr Gretta postponed.  -d/w VVS re: access, plan for left arm AVF and tunneled dialysis catheter on Tuesday  Prolonged QT interval  Essential hypertension Meds as above  ESRD on dialysis (HCC) hyperkalemia HD today, Lokelma  discontinued  CAD (coronary artery disease) -LHC 8/12: Severe multivessel CAD with occluded prior stents in the LAD and L CFX territory supplied by these vessels is largely infarcted based on stress test. Therefore, I do not think he will benefit from revascularization to these vessels. -continue medical management  -statin intolerant  PAD (peripheral artery disease) (HCC) Left BKA, right TMA Continue ASA  Statin intolerant   Dyslipidemia Statin intolerant   Type 2 diabetes mellitus (HCC) A1c is 7.7 CBGs elevated, refuses insulin   Iron  deficiency anemia, unspecified Baseline hgb around 10    DVT prophylaxis: Hep SQ Code Status: Full Code Family Communication:none present Disposition Plan: none present  Consultants:    Procedures:   Antimicrobials:    Objective: Vitals:   06/06/24 0020 06/06/24 0439 06/06/24 0500 06/06/24 0735  BP: 124/78 (!) 130/96  (!) 149/101  Pulse: 93 86  81  Resp: 17 20  18   Temp: (!) 97.4 F (36.3 C) 98.1 F (36.7 C)  97.8 F (36.6 C)  TempSrc: Oral Oral  Oral  SpO2:  94% 98%  99%  Weight:   74.8 kg   Height:        Intake/Output Summary (Last 24 hours) at 06/06/2024 1007 Last data filed at 06/05/2024 1700 Gross per 24 hour  Intake 120 ml  Output --   Net 120 ml   Filed Weights   06/04/24 0500 06/05/24 0415 06/06/24 0500  Weight: 71.3 kg 74.2 kg 74.8 kg    Examination:  General exam: Chronically ill-appearing, AAO x 3, no distress HEENT: no JVD, mild lower lip swelling, minimal improvement Respiratory system: Clear bilaterally Cardiovascular system: S1 & S2 heard, RRR.  Systolic murmur Abd: nondistended, soft and nontender.Normal bowel sounds heard. Central nervous system: Alert and oriented. No focal neurological deficits. Extremities: Left BKA, right TMA Skin: No rashes Psychiatry: flat affect    Data Reviewed:   CBC: Recent Labs  Lab 05/31/24 1729 06/02/24 0320 06/03/24 0235 06/03/24 0827 06/03/24 0828 06/03/24 0832  WBC 3.6* 2.8* 3.0*  --   --   --   NEUTROABS 3.2  --   --   --   --   --   HGB 10.5* 11.0* 12.0* 11.6* 11.2* 10.2*  HCT 32.8* 33.0* 37.4* 34.0* 33.0* 30.0*  MCV 86.1 84.6 84.2  --   --   --   PLT 80* 154 123*  --   --   --    Basic Metabolic Panel: Recent Labs  Lab 05/31/24 1729 06/01/24 0221 06/02/24 0320 06/03/24 0235 06/03/24 0827 06/03/24 0828 06/03/24 0832 06/04/24 0233 06/05/24 0240 06/06/24 0343  NA 134*   < > 133* 139   < > 136 142 138 137 135  K 6.2*   < > 4.0 4.3   < > 4.3 3.7 4.5 4.8 5.6*  CL 98   < > 94* 97*  --   --   --  97* 98 97*  CO2 20*   < > 24 27  --   --   --  24 23 23   GLUCOSE 206*   < > 234* 199*  --   --   --  260* 293* 145*  BUN 43*   < > 42* 35*  --   --   --  54* 71* 83*  CREATININE 8.89*   < > 7.64* 6.52*  --   --   --  8.53* 9.95* 11.17*  CALCIUM  9.4   < > 8.9 8.9  --   --   --  8.9 9.1 9.4  MG 2.5*  --   --   --   --   --   --   --   --   --   PHOS  --   --   --   --   --   --   --  5.2*  --   --    < > = values in this interval not displayed.   GFR: Estimated Creatinine Clearance: 7.4 mL/min (A) (by C-G formula based on SCr of 11.17 mg/dL (H)). Liver Function Tests: Recent Labs  Lab 05/31/24 1729 06/01/24 0221  AST 23 16  ALT 15 13  ALKPHOS  102 91  BILITOT 1.1 1.5*  PROT 6.9 6.3*  ALBUMIN  3.9 3.7   No results for input(s): LIPASE, AMYLASE in the last 168 hours. No results for input(s): AMMONIA in the last 168 hours. Coagulation Profile: No results for input(s): INR, PROTIME in the last 168 hours. Cardiac Enzymes: No results for input(s): CKTOTAL, CKMB, CKMBINDEX, TROPONINI in  the last 168 hours. BNP (last 3 results) No results for input(s): PROBNP in the last 8760 hours. HbA1C: No results for input(s): HGBA1C in the last 72 hours.  CBG: Recent Labs  Lab 06/05/24 0644 06/05/24 1113 06/05/24 1544 06/05/24 2201 06/06/24 0644  GLUCAP 213* 139* 167* 197* 116*   Lipid Profile: No results for input(s): CHOL, HDL, LDLCALC, TRIG, CHOLHDL, LDLDIRECT in the last 72 hours. Thyroid  Function Tests: No results for input(s): TSH, T4TOTAL, FREET4, T3FREE, THYROIDAB in the last 72 hours. Anemia Panel: No results for input(s): VITAMINB12, FOLATE, FERRITIN, TIBC, IRON , RETICCTPCT in the last 72 hours. Urine analysis:    Component Value Date/Time   COLORURINE YELLOW 06/06/2011 2300   APPEARANCEUR CLOUDY (A) 06/06/2011 2300   LABSPEC 1.021 06/06/2011 2300   PHURINE 5.0 06/06/2011 2300   GLUCOSEU NEGATIVE 06/06/2011 2300   GLUCOSEU > or = 1000 mg/dL (AA) 92/71/7990 8784   HGBUR SMALL (A) 06/06/2011 2300   HGBUR moderate 11/01/2007 0931   BILIRUBINUR NEGATIVE 06/06/2011 2300   KETONESUR NEGATIVE 06/06/2011 2300   PROTEINUR >300 (A) 06/06/2011 2300   UROBILINOGEN 0.2 06/06/2011 2300   NITRITE NEGATIVE 06/06/2011 2300   LEUKOCYTESUR SMALL (A) 06/06/2011 2300   Sepsis Labs: @LABRCNTIP (procalcitonin:4,lacticidven:4)  )No results found for this or any previous visit (from the past 240 hours).   Radiology Studies: No results found.    Scheduled Meds:  aspirin  EC  81 mg Oral Daily   heparin  injection (subcutaneous)  5,000 Units Subcutaneous Q8H   insulin  aspart  0-6  Units Subcutaneous TID WC   insulin  aspart  4 Units Subcutaneous TID WC   insulin  glargine-yfgn  10 Units Subcutaneous Daily   lactulose   20 g Oral BID   pantoprazole   40 mg Oral Daily   polyethylene glycol  17 g Oral Daily   senna-docusate  1 tablet Oral BID   sevelamer  carbonate  2,400 mg Oral TID WC   sodium chloride  flush  3 mL Intravenous Q12H   sodium chloride  flush  3 mL Intravenous Q12H   traZODone   150 mg Oral QHS   Continuous Infusions:     LOS: 6 days    Time spent:    Sigurd Pac, MD [facility]   06/06/2024, 10:07 AM

## 2024-06-06 NOTE — Progress Notes (Signed)
 [facility] Progress Note   Subjective:   Seen in room. No new events overnight. Comfortable this am. K 5.6. For dialysis today.   Objective Vitals:   06/06/24 0020 06/06/24 0439 06/06/24 0500 06/06/24 0735  BP: 124/78 (!) 130/96  (!) 149/101  Pulse: 93 86  81  Resp: 17 20  18   Temp: (!) 97.4 F (36.3 C) 98.1 F (36.7 C)  97.8 F (36.6 C)  TempSrc: Oral Oral  Oral  SpO2: 94% 98%  99%  Weight:   74.8 kg   Height:       Physical Exam General: Alert, sitting up in wc, nad Heart: RRR Lungs:CTAB, nml WOB on RA Abdomen:soft, NTND Extremities:no LE edema, L BKA Dialysis Access: RU AVF +b/t   Filed Weights   06/04/24 0500 06/05/24 0415 06/06/24 0500  Weight: 71.3 kg 74.2 kg 74.8 kg    Intake/Output Summary (Last 24 hours) at 06/06/2024 1038 Last data filed at 06/05/2024 1700 Gross per 24 hour  Intake 120 ml  Output --  Net 120 ml    Additional Objective Labs: Basic Metabolic Panel: Recent Labs  Lab 06/04/24 0233 06/05/24 0240 06/06/24 0343  NA 138 137 135  K 4.5 4.8 5.6*  CL 97* 98 97*  CO2 24 23 23   GLUCOSE 260* 293* 145*  BUN 54* 71* 83*  CREATININE 8.53* 9.95* 11.17*  CALCIUM  8.9 9.1 9.4  PHOS 5.2*  --   --    Liver Function Tests: Recent Labs  Lab 05/31/24 1729 06/01/24 0221  AST 23 16  ALT 15 13  ALKPHOS 102 91  BILITOT 1.1 1.5*  PROT 6.9 6.3*  ALBUMIN  3.9 3.7   CBC: Recent Labs  Lab 05/31/24 1729 06/02/24 0320 06/03/24 0235 06/03/24 0827 06/03/24 0828 06/03/24 0832  WBC 3.6* 2.8* 3.0*  --   --   --   NEUTROABS 3.2  --   --   --   --   --   HGB 10.5* 11.0* 12.0* 11.6* 11.2* 10.2*  HCT 32.8* 33.0* 37.4* 34.0* 33.0* 30.0*  MCV 86.1 84.6 84.2  --   --   --   PLT 80* 154 123*  --   --   --    Medications:   aspirin  EC  81 mg Oral Daily   heparin  injection (subcutaneous)  5,000 Units Subcutaneous Q8H   insulin  aspart  0-6 Units Subcutaneous TID WC   insulin  aspart  4 Units Subcutaneous TID WC   insulin  glargine-yfgn   10 Units Subcutaneous Daily   lactulose   20 g Oral BID   pantoprazole   40 mg Oral Daily   polyethylene glycol  17 g Oral Daily   senna-docusate  1 tablet Oral BID   sevelamer  carbonate  2,400 mg Oral TID WC   sodium chloride  flush  3 mL Intravenous Q12H   sodium chloride  flush  3 mL Intravenous Q12H   traZODone   150 mg Oral QHS    Dialysis Orders: [facility]   on MWF . New EDW 72 kg HD Bath 1K/2.5Ca  Time 4:00 Heparin  none. Access RUE AVF BFR 400 DFR 800    Hectoral 3 mcg IV/HD   Assessment/Plan: Acute on chronic HFrEF/ICM/ Valvular heart disease/multivessel CAD.  Echo EF 30-35%. s/p R/LHC. No targets for PCI on LHC.  Hemodynamics improved on RHC after additional UF. HF team following -->started on entresto . No BB due to low output.  Vascular access - [facility]VS planning new L AVG and TDC placement on 06/07/24.  ESRD -  HD MWF.  HD today.  Hypertension. BP better.- Entresto  49-51mg  started by HF team.  Volume. EDW lowered to 72 kg. Continue UF as tolerated.  Anemia  - Hgb 10.2. no indication for ESA at this time.  Metabolic bone disease -   Calcium  and phos in goal. continue with home meds Nutrition - Currently on regular diet. Added fluid restrictions.  Monitor labs closely, if K becomes elevated will need to change to renal diet.   Maisie Ronnald Acosta PA-C [facility] 06/06/2024,10:40 AM

## 2024-06-06 NOTE — Plan of Care (Signed)
  Problem: Education: Goal: Understanding of CV disease, CV risk reduction, and recovery process will improve Outcome: Progressing Goal: Individualized Educational Video(s) Outcome: Progressing   Problem: Activity: Goal: Ability to return to baseline activity level will improve Outcome: Progressing   Problem: Cardiovascular: Goal: Ability to achieve and maintain adequate cardiovascular perfusion will improve Outcome: Progressing Goal: Vascular access site(s) Level 0-1 will be maintained Outcome: Progressing   Problem: Health Behavior/Discharge Planning: Goal: Ability to safely manage health-related needs after discharge will improve Outcome: Progressing   Problem: Education: Goal: Knowledge of General Education information will improve Description: Including pain rating scale, medication(s)/side effects and non-pharmacologic comfort measures Outcome: Progressing   Problem: Health Behavior/Discharge Planning: Goal: Ability to manage health-related needs will improve Outcome: Progressing   Problem: Clinical Measurements: Goal: Ability to maintain clinical measurements within normal limits will improve Outcome: Progressing Goal: Will remain free from infection Outcome: Progressing Goal: Diagnostic test results will improve Outcome: Progressing Goal: Respiratory complications will improve Outcome: Progressing Goal: Cardiovascular complication will be avoided Outcome: Progressing   Problem: Activity: Goal: Risk for activity intolerance will decrease Outcome: Progressing   Problem: Nutrition: Goal: Adequate nutrition will be maintained Outcome: Progressing   Problem: Coping: Goal: Level of anxiety will decrease Outcome: Progressing   Problem: Elimination: Goal: Will not experience complications related to bowel motility Outcome: Progressing Goal: Will not experience complications related to urinary retention Outcome: Progressing   Problem: Pain Managment: Goal:  General experience of comfort will improve and/or be controlled Outcome: Progressing   Problem: Safety: Goal: Ability to remain free from injury will improve Outcome: Progressing   Problem: Skin Integrity: Goal: Risk for impaired skin integrity will decrease Outcome: Progressing   Problem: Education: Goal: Ability to describe self-care measures that may prevent or decrease complications (Diabetes Survival Skills Education) will improve Outcome: Progressing Goal: Individualized Educational Video(s) Outcome: Progressing   Problem: Coping: Goal: Ability to adjust to condition or change in health will improve Outcome: Progressing   Problem: Fluid Volume: Goal: Ability to maintain a balanced intake and output will improve Outcome: Progressing   Problem: Health Behavior/Discharge Planning: Goal: Ability to identify and utilize available resources and services will improve Outcome: Progressing Goal: Ability to manage health-related needs will improve Outcome: Progressing   Problem: Metabolic: Goal: Ability to maintain appropriate glucose levels will improve Outcome: Progressing   Problem: Nutritional: Goal: Maintenance of adequate nutrition will improve Outcome: Progressing Goal: Progress toward achieving an optimal weight will improve Outcome: Progressing   Problem: Skin Integrity: Goal: Risk for impaired skin integrity will decrease Outcome: Progressing   Problem: Tissue Perfusion: Goal: Adequacy of tissue perfusion will improve Outcome: Progressing

## 2024-06-07 ENCOUNTER — Encounter (HOSPITAL_COMMUNITY)
Admission: RE | Disposition: A | Discharge status: Home / Self Care | Payer: Self-pay | Source: Home / Self Care | Attending: Internal Medicine

## 2024-06-07 ENCOUNTER — Inpatient Hospital Stay (HOSPITAL_COMMUNITY)

## 2024-06-07 ENCOUNTER — Inpatient Hospital Stay (HOSPITAL_COMMUNITY): Admitting: Anesthesiology

## 2024-06-07 ENCOUNTER — Encounter (HOSPITAL_COMMUNITY): Payer: Self-pay | Admitting: Family Medicine

## 2024-06-07 ENCOUNTER — Other Ambulatory Visit: Payer: Self-pay

## 2024-06-07 DIAGNOSIS — Z992 Dependence on renal dialysis: Secondary | ICD-10-CM | POA: Diagnosis not present

## 2024-06-07 DIAGNOSIS — I5023 Acute on chronic systolic (congestive) heart failure: Secondary | ICD-10-CM | POA: Diagnosis not present

## 2024-06-07 DIAGNOSIS — N186 End stage renal disease: Secondary | ICD-10-CM

## 2024-06-07 DIAGNOSIS — I708 Atherosclerosis of other arteries: Secondary | ICD-10-CM

## 2024-06-07 DIAGNOSIS — I132 Hypertensive heart and chronic kidney disease with heart failure and with stage 5 chronic kidney disease, or end stage renal disease: Secondary | ICD-10-CM

## 2024-06-07 DIAGNOSIS — I509 Heart failure, unspecified: Secondary | ICD-10-CM | POA: Diagnosis not present

## 2024-06-07 HISTORY — PX: INSERTION OF DIALYSIS CATHETER: SHX1324

## 2024-06-07 HISTORY — PX: ULTRASOUND GUIDANCE FOR VASCULAR ACCESS: SHX6516

## 2024-06-07 HISTORY — PX: INSERTION OF ARTERIOVENOUS (AV) ARTEGRAFT ARM: SHX6779

## 2024-06-07 LAB — BASIC METABOLIC PANEL WITH GFR
Anion gap: 13 (ref 5–15)
BUN: 58 mg/dL — ABNORMAL HIGH (ref 6–20)
CO2: 27 mmol/L (ref 22–32)
Calcium: 9.2 mg/dL (ref 8.9–10.3)
Chloride: 97 mmol/L — ABNORMAL LOW (ref 98–111)
Creatinine, Ser: 8.63 mg/dL — ABNORMAL HIGH (ref 0.61–1.24)
GFR, Estimated: 7 mL/min — ABNORMAL LOW (ref >60)
Glucose, Bld: 96 mg/dL (ref 70–99)
Potassium: 4.8 mmol/L (ref 3.5–5.1)
Sodium: 137 mmol/L (ref 135–145)

## 2024-06-07 LAB — GLUCOSE, CAPILLARY
Glucose-Capillary: 175 mg/dL — ABNORMAL HIGH (ref 70–99)
Glucose-Capillary: 158 mg/dL — ABNORMAL HIGH (ref 70–99)
Glucose-Capillary: 113 mg/dL — ABNORMAL HIGH (ref 70–99)
Glucose-Capillary: 99 mg/dL (ref 70–99)
Glucose-Capillary: 97 mg/dL (ref 70–99)

## 2024-06-07 SURGERY — INSERTION, GRAFT, ARTERIOVENOUS, UPPER EXTREMITY
Anesthesia: General | Site: Chest | Laterality: Left

## 2024-06-07 MED ORDER — HEPARIN SODIUM (PORCINE) 1000 UNIT/ML IJ SOLN
INTRAMUSCULAR | Status: DC | PRN
Start: 2024-06-07 — End: 2024-06-07
  Administered 2024-06-07: 3000 [IU] via INTRAVENOUS

## 2024-06-07 MED ORDER — LIDOCAINE 2% (20 MG/ML) 5 ML SYRINGE
INTRAMUSCULAR | Status: AC
  Filled 2024-06-07: qty 5

## 2024-06-07 MED ORDER — VASOPRESSIN 20 UNIT/ML IV SOLN
INTRAVENOUS | Status: AC
Start: 2024-06-07 — End: 2024-06-07
  Filled 2024-06-07: qty 1

## 2024-06-07 MED ORDER — ONDANSETRON HCL 4 MG/2ML IJ SOLN: 4.0000 mg | Freq: Once | INTRAMUSCULAR | Status: DC | PRN

## 2024-06-07 MED ORDER — LACTATED RINGERS IV SOLN: INTRAVENOUS | Status: DC

## 2024-06-07 MED ORDER — FENTANYL CITRATE (PF) 250 MCG/5ML IJ SOLN
INTRAMUSCULAR | Status: AC
  Filled 2024-06-07: qty 5

## 2024-06-07 MED ORDER — ETOMIDATE 2 MG/ML IV SOLN
INTRAVENOUS | Status: AC
Start: 2024-06-07 — End: 2024-06-07
  Filled 2024-06-07: qty 10

## 2024-06-07 MED ORDER — PHENYLEPHRINE 80 MCG/ML (10ML) SYRINGE FOR IV PUSH (FOR BLOOD PRESSURE SUPPORT)
PREFILLED_SYRINGE | INTRAVENOUS | Status: AC
  Filled 2024-06-07: qty 10

## 2024-06-07 MED ORDER — HEPARIN SODIUM (PORCINE) 1000 UNIT/ML IJ SOLN
INTRAMUSCULAR | Status: AC
  Filled 2024-06-07: qty 10

## 2024-06-07 MED ORDER — HEMOSTATIC AGENTS (NO CHARGE) OPTIME
TOPICAL | Status: DC | PRN
Start: 2024-06-07 — End: 2024-06-07
  Administered 2024-06-07 (×3): 1 via TOPICAL

## 2024-06-07 MED ORDER — EPINEPHRINE 1 MG/10ML IJ SOSY
PREFILLED_SYRINGE | INTRAMUSCULAR | Status: AC
  Filled 2024-06-07: qty 10

## 2024-06-07 MED ORDER — MIDAZOLAM HCL 2 MG/2ML IJ SOLN
INTRAMUSCULAR | Status: DC | PRN
  Administered 2024-06-07: 2 mg via INTRAVENOUS

## 2024-06-07 MED ORDER — SODIUM CHLORIDE 0.9 % IV SOLN
20.0000 ug | Freq: Once | INTRAVENOUS | Status: AC
  Administered 2024-06-07: 20 ug via INTRAVENOUS
  Filled 2024-06-07: qty 5

## 2024-06-07 MED ORDER — LIDOCAINE 2% (20 MG/ML) 5 ML SYRINGE
INTRAMUSCULAR | Status: DC | PRN
  Administered 2024-06-07: 60 mg via INTRAVENOUS

## 2024-06-07 MED ORDER — INSULIN ASPART 100 UNIT/ML IJ SOLN: 0.0000 [IU] | INTRAMUSCULAR | Status: DC | PRN

## 2024-06-07 MED ORDER — VANCOMYCIN HCL IN DEXTROSE 1-5 GM/200ML-% IV SOLN
1000.0000 mg | Freq: Once | INTRAVENOUS | Status: AC
  Administered 2024-06-07: 1000 mg via INTRAVENOUS

## 2024-06-07 MED ORDER — HEPARIN 6000 UNIT IRRIGATION SOLUTION
Status: DC | PRN
  Administered 2024-06-07: 1

## 2024-06-07 MED ORDER — DIPHENHYDRAMINE HCL 50 MG/ML IJ SOLN
INTRAMUSCULAR | Status: AC
  Filled 2024-06-07: qty 1

## 2024-06-07 MED ORDER — 0.9 % SODIUM CHLORIDE (POUR BTL) OPTIME
TOPICAL | Status: DC | PRN
  Administered 2024-06-07: 1000 mL

## 2024-06-07 MED ORDER — ACETAMINOPHEN 500 MG PO TABS
1000.0000 mg | ORAL_TABLET | Freq: Once | ORAL | Status: AC
  Administered 2024-06-07: 1000 mg via ORAL
  Filled 2024-06-07: qty 2

## 2024-06-07 MED ORDER — PROPOFOL 10 MG/ML IV BOLUS
INTRAVENOUS | Status: AC
  Filled 2024-06-07: qty 20

## 2024-06-07 MED ORDER — ETOMIDATE 2 MG/ML IV SOLN
INTRAVENOUS | Status: DC | PRN
  Administered 2024-06-07 (×2): 2 mg via INTRAVENOUS
  Administered 2024-06-07: 8 mg via INTRAVENOUS

## 2024-06-07 MED ORDER — ROCURONIUM BROMIDE 10 MG/ML (PF) SYRINGE
PREFILLED_SYRINGE | INTRAVENOUS | Status: DC | PRN
  Administered 2024-06-07: 50 mg via INTRAVENOUS
  Administered 2024-06-07: 10 mg via INTRAVENOUS

## 2024-06-07 MED ORDER — EPHEDRINE 5 MG/ML INJ
INTRAVENOUS | Status: AC
  Filled 2024-06-07: qty 5

## 2024-06-07 MED ORDER — SUGAMMADEX SODIUM 200 MG/2ML IV SOLN
INTRAVENOUS | Status: DC | PRN
  Administered 2024-06-07: 200 mg via INTRAVENOUS

## 2024-06-07 MED ORDER — HEPARIN SODIUM (PORCINE) 1000 UNIT/ML IJ SOLN
INTRAMUSCULAR | Status: DC | PRN
  Administered 2024-06-07: 3.8 [IU]

## 2024-06-07 MED ORDER — ONDANSETRON HCL 4 MG/2ML IJ SOLN
4.0000 mg | Freq: Once | INTRAMUSCULAR | Status: AC
  Administered 2024-06-07: 4 mg via INTRAVENOUS
  Filled 2024-06-07: qty 2

## 2024-06-07 MED ORDER — FENTANYL CITRATE (PF) 250 MCG/5ML IJ SOLN
INTRAMUSCULAR | Status: DC | PRN
  Administered 2024-06-07: 50 ug via INTRAVENOUS
  Administered 2024-06-07 (×2): 25 ug via INTRAVENOUS
  Administered 2024-06-07: 50 ug via INTRAVENOUS

## 2024-06-07 MED ORDER — MIDAZOLAM HCL 2 MG/2ML IJ SOLN
INTRAMUSCULAR | Status: AC
  Filled 2024-06-07: qty 2

## 2024-06-07 MED ORDER — DIPHENHYDRAMINE HCL 50 MG/ML IJ SOLN
25.0000 mg | Freq: Once | INTRAMUSCULAR | Status: AC
  Administered 2024-06-07: 25 mg via INTRAVENOUS

## 2024-06-07 MED ORDER — PHENYLEPHRINE 80 MCG/ML (10ML) SYRINGE FOR IV PUSH (FOR BLOOD PRESSURE SUPPORT)
PREFILLED_SYRINGE | INTRAVENOUS | Status: DC | PRN
  Administered 2024-06-07: 240 ug via INTRAVENOUS
  Administered 2024-06-07: 160 ug via INTRAVENOUS
  Administered 2024-06-07: 200 ug via INTRAVENOUS
  Administered 2024-06-07: 240 ug via INTRAVENOUS

## 2024-06-07 MED ORDER — ALBUMIN HUMAN 5 % IV SOLN: INTRAVENOUS | Status: DC | PRN

## 2024-06-07 MED ORDER — VANCOMYCIN HCL IN DEXTROSE 1-5 GM/200ML-% IV SOLN
INTRAVENOUS | Status: AC
  Filled 2024-06-07: qty 200

## 2024-06-07 MED ORDER — ROCURONIUM BROMIDE 10 MG/ML (PF) SYRINGE
PREFILLED_SYRINGE | INTRAVENOUS | Status: AC
  Filled 2024-06-07: qty 10

## 2024-06-07 MED ORDER — GLUCAGON HCL RDNA (DIAGNOSTIC) 1 MG IJ SOLR
INTRAMUSCULAR | Status: AC
Start: 2024-06-07 — End: 2024-06-07
  Administered 2024-06-07: 1 mg
  Filled 2024-06-07: qty 1

## 2024-06-07 MED ORDER — FENTANYL CITRATE (PF) 100 MCG/2ML IJ SOLN: 25.0000 ug | INTRAMUSCULAR | Status: DC | PRN

## 2024-06-07 MED ORDER — EPHEDRINE SULFATE-NACL 50-0.9 MG/10ML-% IV SOSY
PREFILLED_SYRINGE | INTRAVENOUS | Status: DC | PRN
  Administered 2024-06-07: 10 mg via INTRAVENOUS

## 2024-06-07 MED ORDER — HEPARIN 6000 UNIT IRRIGATION SOLUTION
Status: AC
  Filled 2024-06-07: qty 500

## 2024-06-07 MED ORDER — VASOPRESSIN 20 UNIT/ML IV SOLN
INTRAVENOUS | Status: DC | PRN
  Administered 2024-06-07 (×2): 1 [IU] via INTRAVENOUS

## 2024-06-07 MED ORDER — HYDROCODONE-ACETAMINOPHEN 7.5-325 MG PO TABS: 1.0000 | ORAL_TABLET | Freq: Once | ORAL | Status: DC | PRN

## 2024-06-07 MED ORDER — PHENYLEPHRINE HCL-NACL 20-0.9 MG/250ML-% IV SOLN
INTRAVENOUS | Status: DC | PRN
  Administered 2024-06-07: 15 ug/min via INTRAVENOUS

## 2024-06-07 SURGICAL SUPPLY — 48 items
ARMBAND PINK RESTRICT EXTREMIT (MISCELLANEOUS) ×3 IMPLANT
BAG COUNTER SPONGE SURGICOUNT (BAG) ×3 IMPLANT
BAG DECANTER FOR FLEXI CONT (MISCELLANEOUS) ×3 IMPLANT
BIOPATCH RED 1 DISK 7.0 (GAUZE/BANDAGES/DRESSINGS) ×3 IMPLANT
CANISTER SUCTION 3000ML PPV (SUCTIONS) ×3 IMPLANT
CATH PALINDROME-P 23 W/VT (CATHETERS) ×1 IMPLANT
CLIP TI MEDIUM 6 (CLIP) ×3 IMPLANT
CLIP TI WIDE RED SMALL 6 (CLIP) ×4 IMPLANT
COVER PROBE W GEL 5X96 (DRAPES) ×4 IMPLANT
COVER SURGICAL LIGHT HANDLE (MISCELLANEOUS) ×3 IMPLANT
DERMABOND ADVANCED .7 DNX12 (GAUZE/BANDAGES/DRESSINGS) ×3 IMPLANT
DRAPE C-ARM 42X72 X-RAY (DRAPES) ×2 IMPLANT
DRAPE CHEST BREAST 15X10 FENES (DRAPES) ×3 IMPLANT
ELECTRODE REM PT RTRN 9FT ADLT (ELECTROSURGICAL) ×3 IMPLANT
GAUZE 4X4 16PLY ~~LOC~~+RFID DBL (SPONGE) ×3 IMPLANT
GLOVE BIOGEL PI IND STRL 7.0 (GLOVE) ×3 IMPLANT
GOWN STRL REUS W/ TWL LRG LVL3 (GOWN DISPOSABLE) ×6 IMPLANT
GOWN STRL REUS W/ TWL XL LVL3 (GOWN DISPOSABLE) ×3 IMPLANT
GRAFT GORETEX STRT 4-7X45 (Vascular Products) ×1 IMPLANT
HEMOSTAT HEMOBLAST BELLOWS (HEMOSTASIS) ×1 IMPLANT
HEMOSTAT SNOW SURGICEL 2X4 (HEMOSTASIS) ×1 IMPLANT
INSERT FOGARTY SM (MISCELLANEOUS) IMPLANT
KIT BASIN OR (CUSTOM PROCEDURE TRAY) ×3 IMPLANT
KIT TURNOVER KIT B (KITS) ×3 IMPLANT
LOOP VESSEL MINI RED (MISCELLANEOUS) ×1 IMPLANT
NDL 18GX1X1/2 (RX/OR ONLY) (NEEDLE) ×2 IMPLANT
NDL HYPO 25GX1X1/2 BEV (NEEDLE) ×2 IMPLANT
NEEDLE 18GX1X1/2 (RX/OR ONLY) (NEEDLE) ×3 IMPLANT
NEEDLE HYPO 25GX1X1/2 BEV (NEEDLE) ×3 IMPLANT
NS IRRIG 1000ML POUR BTL (IV SOLUTION) ×3 IMPLANT
PACK CV ACCESS (CUSTOM PROCEDURE TRAY) ×3 IMPLANT
PACK SRG BSC III STRL LF ECLPS (CUSTOM PROCEDURE TRAY) ×3 IMPLANT
PAD ARMBOARD POSITIONER FOAM (MISCELLANEOUS) ×6 IMPLANT
POWDER SURGICEL 3.0 GRAM (HEMOSTASIS) ×1 IMPLANT
SET MICROPUNCTURE 5F STIFF (MISCELLANEOUS) ×1 IMPLANT
SLING ARM FOAM STRAP LRG (SOFTGOODS) IMPLANT
SUT ETHILON 3 0 PS 1 (SUTURE) ×3 IMPLANT
SUT MNCRL AB 4-0 PS2 18 (SUTURE) ×5 IMPLANT
SUT PROLENE 6 0 BV (SUTURE) ×6 IMPLANT
SUT VIC AB 3-0 SH 27X BRD (SUTURE) ×4 IMPLANT
SYR 10ML LL (SYRINGE) ×3 IMPLANT
SYR 20ML LL LF (SYRINGE) ×6 IMPLANT
SYR 5ML LL (SYRINGE) ×3 IMPLANT
SYR CONTROL 10ML LL (SYRINGE) ×3 IMPLANT
TOWEL GREEN STERILE (TOWEL DISPOSABLE) ×3 IMPLANT
TOWEL GREEN STERILE FF (TOWEL DISPOSABLE) ×6 IMPLANT
UNDERPAD 30X36 HEAVY ABSORB (UNDERPADS AND DIAPERS) ×3 IMPLANT
WATER STERILE IRR 1000ML POUR (IV SOLUTION) ×3 IMPLANT

## 2024-06-07 NOTE — Progress Notes (Signed)
 [facility] Progress Note   Subjective:    Seen in room. Completed dialysis yesterday with net UF 3L. He feels like dry weight should be 72.5kg.  On his way to the OR for access surgery.   Objective Vitals:   06/06/24 2238 06/07/24 0400 06/07/24 0500 06/07/24 0730  BP: 121/83 133/84  129/86  Pulse: 88 79  78  Resp: 20 20  18   Temp: 98.1 F (36.7 C) 97.8 F (36.6 C)  97.8 F (36.6 C)  TempSrc: Oral Oral  Oral  SpO2: 100% 96%  97%  Weight:   76.2 kg   Height:       Physical Exam General: Alert,lying in bed, nad Heart: RRR  Lungs: Clear  Abdomen: non tender  Extremities: no LE edema, L BKA Dialysis Access: RU AVF +brut   Filed Weights   06/06/24 1752 06/06/24 2130 06/07/24 0500  Weight: 76.1 kg 75.3 kg 76.2 kg    Intake/Output Summary (Last 24 hours) at 06/07/2024 0914 Last data filed at 06/06/2024 2259 Gross per 24 hour  Intake 123 ml  Output 3000 ml  Net -2877 ml    Additional Objective Labs: Basic Metabolic Panel: Recent Labs  Lab 06/04/24 0233 06/05/24 0240 06/06/24 0343 06/07/24 0316  NA 138 137 135 137  K 4.5 4.8 5.6* 4.8  CL 97* 98 97* 97*  CO2 24 23 23 27   GLUCOSE 260* 293* 145* 96  BUN 54* 71* 83* 58*  CREATININE 8.53* 9.95* 11.17* 8.63*  CALCIUM  8.9 9.1 9.4 9.2  PHOS 5.2*  --   --   --    Liver Function Tests: Recent Labs  Lab 05/31/24 1729 06/01/24 0221  AST 23 16  ALT 15 13  ALKPHOS 102 91  BILITOT 1.1 1.5*  PROT 6.9 6.3*  ALBUMIN  3.9 3.7   CBC: Recent Labs  Lab 05/31/24 1729 06/02/24 0320 06/03/24 0235 06/03/24 0827 06/03/24 0828 06/03/24 0832 06/06/24 1833  WBC 3.6* 2.8* 3.0*  --   --   --  4.1  NEUTROABS 3.2  --   --   --   --   --   --   HGB 10.5* 11.0* 12.0*   < > 11.2* 10.2* 9.9*  HCT 32.8* 33.0* 37.4*   < > 33.0* 30.0* 30.7*  MCV 86.1 84.6 84.2  --   --   --  83.7  PLT 80* 154 123*  --   --   --  129*   < > = values in this interval not displayed.   Medications:   aspirin  EC  81 mg Oral Daily    heparin  injection (subcutaneous)  5,000 Units Subcutaneous Q8H   insulin  aspart  0-6 Units Subcutaneous TID WC   insulin  aspart  4 Units Subcutaneous TID WC   insulin  glargine-yfgn  10 Units Subcutaneous Daily   lactulose   20 g Oral BID   pantoprazole   40 mg Oral Daily   polyethylene glycol  17 g Oral Daily   senna-docusate  1 tablet Oral BID   sevelamer  carbonate  1,600 mg Oral TID WC   sodium chloride  flush  3 mL Intravenous Q12H   sodium chloride  flush  3 mL Intravenous Q12H   traZODone   150 mg Oral QHS    Dialysis Orders: [facility]   on MWF .  HD Bath 1K/2.5Ca  Time 4:00 Heparin  none. Access RUE AVF BFR 400 DFR 800    Hectoral 3 mcg IV/HD   Assessment/Plan: Acute on chronic HFrEF/ICM/ Valvular heart  disease/multivessel CAD.  Echo EF 30-35%. s/p R/LHC. No targets for PCI on LHC.  Hemodynamics improved on RHC after additional UF. HF team following -->started on entresto . Now held w concern for angioedema.  Vascular access - [facility]VS planning new L AVG and TDC placement on 06/07/24.  ESRD -  HD MWF.  Next HD 8/20 Hypertension. BP acceptable. Holding Entresto .  Volume. Lower EDW to 72.5 kg Continue UF as tolerated.  Anemia  - Hgb 9-10.  Follow trends. Would resume ESA if Hgb < 9.  Metabolic bone disease -   Calcium  and phos in goal. continue with home meds Nutrition - Currently on regular diet. Added fluid restrictions.  Monitor labs closely, if K becomes elevated will need to change to renal diet.   Maisie Ronnald Acosta PA-C [facility] 06/07/2024,9:14 AM

## 2024-06-07 NOTE — Anesthesia Preprocedure Evaluation (Addendum)
 Anesthesia Evaluation  Patient identified by MRN, date of birth, ID band Patient awake    Reviewed: Allergy & Precautions, H&P , NPO status , Patient's Chart, lab work & pertinent test results, reviewed documented beta blocker date and time   History of Anesthesia Complications (+) PONV and history of anesthetic complications  Airway Mallampati: III  TM Distance: >3 FB Neck ROM: Full    Dental  (+) Teeth Intact, Dental Advisory Given   Pulmonary neg pulmonary ROS   Pulmonary exam normal breath sounds clear to auscultation       Cardiovascular hypertension, Pt. on medications and Pt. on home beta blockers pulmonary hypertension (mild pHTN on last cath 05/2024)+ CAD, + Past MI, + Peripheral Vascular Disease and +CHF (LVEF 30-35%)  Normal cardiovascular exam+ Valvular Problems/Murmurs (mod-severe AS) AS  Rhythm:Regular Rate:Normal  Echo 05/17/24  1. Left ventricular ejection fraction, by estimation, is 30 to 35%. Left  ventricular ejection fraction by 3D volume is 30 %. The left ventricle has  moderately decreased function. The left ventricle demonstrates global  hypokinesis. Left ventricular  diastolic function could not be evaluated. The average left ventricular  global longitudinal strain is -11.2 %. The global longitudinal strain is  abnormal.   2. Right ventricular systolic function is normal. The right ventricular  size is mildly enlarged.   3. Left atrial size was moderately dilated.   4. A small pericardial effusion is present. The pericardial effusion is  circumferential.   5. The mitral valve is normal in structure. Mild to moderate mitral valve  regurgitation. No evidence of mitral stenosis. Moderate to severe mitral  annular calcification.   6. Tricuspid valve regurgitation is severe.   7. The aortic valve is calcified. There is moderate calcification of the  aortic valve. There is moderate thickening of the aortic  valve. Aortic  valve regurgitation is not visualized. Low flow low gradient moderate to  severe. Aortic valve area, by VTI  measures 0.96 cm. Aortic valve mean gradient measures 9.8 mmHg. Aortic  valve Vmax measures 2.14 m/s,SVI 22, DI 0.34, Indexed AVA 0.51 cm2/m2.   8. The inferior vena cava is normal in size with greater than 50%  respiratory variability, suggesting right atrial pressure of 3 mmHg.    R heart cath 06/03/24 Findings:   RA = 1 RV = 44/5 PA = 43/12 (25) PCW = 7 Fick cardiac output/index = 4.6/2.4 Thermo CO/CI = 4.7/2.4 PVR = 3.9 Ao sat = 95%  PA sat = 60%, 61% PAPi = 31   Assessment: 1. Much improved hemodynamics 2. Mild PAH with normal left-sided filling pressures and CO    Neuro/Psych negative neurological ROS  negative psych ROS   GI/Hepatic negative GI ROS, Neg liver ROS,,,  Endo/Other  diabetesHypothyroidism    Renal/GU ESRF and DialysisRenal diseaseK 4.8 this AM  negative genitourinary   Musculoskeletal negative musculoskeletal ROS (+)    Abdominal   Peds negative pediatric ROS (+)  Hematology  (+) Blood dyscrasia, anemia Hb 9/9, plt 129   Anesthesia Other Findings   Reproductive/Obstetrics negative OB ROS                              Anesthesia Physical Anesthesia Plan  ASA: 4  Anesthesia Plan: General   Post-op Pain Management: Tylenol  PO (pre-op )*   Induction: Intravenous  PONV Risk Score and Plan: 2 and Ondansetron , Dexamethasone , Midazolam , Diphenhydramine  and Treatment may vary due to age or medical  condition  Airway Management Planned: Oral ETT  Additional Equipment: Arterial line and ClearSight  Intra-op Plan:   Post-operative Plan: Extubation in OR  Informed Consent: I have reviewed the patients History and Physical, chart, labs and discussed the procedure including the risks, benefits and alternatives for the proposed anesthesia with the patient or authorized representative who has  indicated his/her understanding and acceptance.     Dental advisory given  Plan Discussed with: CRNA  Anesthesia Plan Comments: (Itching w/ all narcotics- benadryl  intra-op Will use L arm PIV to get off to sleep and then place R arm PIV (new fistula will be in L arm))         Anesthesia Quick Evaluation

## 2024-06-07 NOTE — Discharge Instructions (Signed)
 [facility]  Discharge Instructions  AV Fistula or Graft Surgery for Dialysis Access  Please refer to the following instructions for your post-procedure care. Your surgeon or physician assistant will discuss any changes with you.  Activity  You may drive the day following your surgery, if you are comfortable and no longer taking prescription pain medication. Resume full activity as the soreness in your incision resolves.  Bathing/Showering  You may shower after you go home. Keep your incision dry for 48 hours. Do not soak in a bathtub, hot tub, or swim until the incision heals completely. You may not shower if you have a hemodialysis catheter.  Incision Care  Clean your incision with mild soap and water  after 48 hours. Pat the area dry with a clean towel. You do not need a bandage unless otherwise instructed. Do not apply any ointments or creams to your incision. You may have skin glue on your incision. Do not peel it off. It will come off on its own in about one week. Your arm may swell a bit after surgery. To reduce swelling use pillows to elevate your arm so it is above your heart. Your doctor will tell you if you need to lightly wrap your arm with an ACE bandage.  Diet  Resume your normal diet. There are not special food restrictions following this procedure. In order to heal from your surgery, it is CRITICAL to get adequate nutrition. Your body requires vitamins, minerals, and protein. Vegetables are the best source of vitamins and minerals. Vegetables also provide the perfect balance of protein. Processed food has little nutritional value, so try to avoid this.  Medications  Resume taking all of your medications. If your incision is causing pain, you may take over-the counter pain relievers such as acetaminophen  (Tylenol ). If you were prescribed a stronger pain medication, please be aware these medications can cause nausea and constipation. Prevent  nausea by taking the medication with a snack or meal. Avoid constipation by drinking plenty of fluids and eating foods with high amount of fiber, such as fruits, vegetables, and grains.  Do not take Tylenol  if you are taking prescription pain medications.  Follow up Your surgeon may want to see you in the office following your access surgery. If so, this will be arranged at the time of your surgery.  Please call us  immediately for any of the following conditions:  Increased pain, redness, drainage (pus) from your incision site Fever of 101 degrees or higher Severe or worsening pain at your incision site Hand pain or numbness.  Reduce your risk of vascular disease:  Stop smoking. If you would like help, call [facility] at 1-800-QUIT-NOW (412-522-1936) or [place] at (671)197-3015  Manage your cholesterol Maintain a desired weight Control your diabetes Keep your blood pressure down  Dialysis  It will take several weeks to several months for your new dialysis access to be ready for use. Your surgeon will determine when it is okay to use it. Your nephrologist will continue to direct your dialysis. You can continue to use your Permcath until your new access is ready for use.   06/07/2024 [name] [id number] [birth year]  Surgeon(s): Pearline Norman RAMAN, MD  Procedure(s): INSERTION LEFT UPPER ARM ARTERIOVENOUS GRAFT INSERTION OF DIALYSIS CATHETER ULTRASOUND GUIDANCE, FOR VASCULAR ACCESS   May stick graft immediately   May stick graft on designated area only:   X Do not stick left AV graft for 4 weeks  If you have any questions, please call the office at [phone].

## 2024-06-07 NOTE — Transfer of Care (Signed)
 Immediate Anesthesia Transfer of Care Note  Patient: [name]  Procedure(s) Performed: INSERTION LEFT UPPER ARM ARTERIOVENOUS GRAFT (Left: Arm Upper) INSERTION OF DIALYSIS CATHETER (Left: Chest) ULTRASOUND GUIDANCE, FOR VASCULAR ACCESS (Left: Chest)  Patient Location: PACU  Anesthesia Type:General  Level of Consciousness: awake, alert , and oriented  Airway & Oxygen  Therapy: Patient Spontanous Breathing and Patient connected to face mask oxygen   Post-op Assessment: Report given to RN and Post -op Vital signs reviewed and stable  Post vital signs: Reviewed and stable  Last Vitals:  Vitals Value Taken Time  BP 160/71 06/07/24 13:34  Temp    Pulse 87 06/07/24 13:40  Resp 19 06/07/24 13:40  SpO2 96 % 06/07/24 13:40  Vitals shown include unfiled device data.  Last Pain:  Vitals:   06/07/24 1009  TempSrc:   PainSc: 5          Complications: No notable events documented.

## 2024-06-07 NOTE — Discharge Summary (Signed)
 Physician Discharge Summary  [name] FMW:[id number] DOB: 26-Sep-1966 DOA: 05/31/2024  PCP: Shelda Atlas, MD  Admit date: 05/31/2024 Discharge date: 06/07/2024  Time spent: 45 minutes  Recommendations for Outpatient Follow-up:  [facility] in 2 weeks, message sent Continue hemodialysis MWF Vascular surgery for access check in few weeks   Discharge Diagnoses:  Principal Problem:   Acute on chronic systolic heart failure (HCC) Severe multivessel CAD Moderate to severe aortic stenosis   Complication of AV dialysis fistula   Prolonged QT interval   Essential hypertension   ESRD on dialysis [facility])   CAD (coronary artery disease)   PAD (peripheral artery disease) (HCC)   Dyslipidemia   Type 2 diabetes mellitus (HCC)   Pulmonary hypertension, unspecified (HCC)   Iron  deficiency anemia, unspecified   Discharge Condition: Improved  Diet recommendation:, Diabetic  Filed Weights   06/06/24 2130 06/07/24 0500 06/07/24 0946  Weight: 75.3 kg 76.2 kg 76.2 kg    History of present illness:  58/M w systolic CHF, CAD, severe aortic stenosis, moderate MR, severe TR PAD with prior left BKA and R TMA, ESRD on HD, DM II. Presented for elective R/LHC as part of pre-op  evaluation for hernia surgery and fistula revision. He was admitted for optimization given h/o low-output HF, volume overload and severe pulmonary hypertension.  Right arm AV fistula complicated by pseudoaneurysm, in-stent stenosis>resulting in low flow and cannulation issues at HD. Revision surgery scheduled for Left AVG and TDC by Dr Gretta then postponed - RHC noted severe pulmonary hypertension, reduced cardiac output,'s SVR severely elevated at> 2400, LHC with severe multivessel CAD -sp multiple dialysis treatments - Repeat right heart cath with significantly improved PA pressures and hemodynamics - 8/15, discussed with vascular surgery regarding access - 8/16 developed lip swelling, concerned about Entresto , dose  held  Hospital Course:   Acute on chronic systolic heart failure (HCC) Severe pulmonary hypertension Valvular heart disease, severe AS, moderate MR, severe TR -echo 04/2024 with new systolic findings of EF of 30-35%.  RV preserved moderate to severe MAC with mild to moderate MR, severe TR, low-flow low gradient moderate to severe aortic stenosis LHC 8/12 with findings of severe multivessel CAD with occluded prior stents in the LAD and left circumflex, RHC RA 22, PA 109/56, wedge 35, cardiac index 1.6, SVR greater than 2400. Decompensated ischemic CM. Severe pulmonary HTN - Significant improvement after repeat HD last night, repeat RHC with normal filling pressures and improved PA pressures - Developed some swelling and discomfort in his lips which he was concerned is related to Entresto , hence discontinued - Plan for DC home after left arm AV graft and TDC placement, patient insists on staying in hospital till procedure completed   Complication of AV dialysis fistula -He has persistent pseudoaneurysm, low access flow to his right arm AV fistula. He has had revision with previous interposition and multiple fistulogram's most recent 03/17/24.  -Plan is for new access. His vein mapping in the left arm shows small surface veins so likely would require graft with TDC placement. -Revision surgery scheduled for 05/30/24 by Dr Gretta postponed.  -d/w VVS re: access, plan for left arm AVF and tunneled dialysis catheter on Tuesday, plan for discharge later today after procedure if stable   Prolonged QT interval   Essential hypertension Meds as above   ESRD on dialysis (HCC) hyperkalemia HD today, Lokelma  given   CAD (coronary artery disease) -LHC 8/12: Severe multivessel CAD with occluded prior stents in the LAD and L CFX  territory supplied by these vessels is largely infarcted based on stress test. Therefore, I do not think he will benefit from revascularization to these vessels. -continue medical  management  -statin intolerant   PAD (peripheral artery disease) (HCC) Left BKA, right TMA Continue ASA  Statin intolerant    Dyslipidemia Statin intolerant    Type 2 diabetes mellitus (HCC) A1c is 7.7 CBGs elevated, refuses insulin    Iron  deficiency anemia, unspecified Baseline hgb around 10     Discharge Exam: Vitals:   06/07/24 0730 06/07/24 0946  BP: 129/86 (!) 135/92  Pulse: 78 79  Resp: 18 20  Temp: 97.8 F (36.6 C) 98 F (36.7 C)  SpO2: 97% 99%   General exam: Chronically ill-appearing, AAO x 3, no distress HEENT: no JVD, mild lower lip swelling, minimal improvement Respiratory system: Clear bilaterally Cardiovascular system: S1 & S2 heard, RRR.  Systolic murmur Abd: nondistended, soft and nontender.Normal bowel sounds heard. Central nervous system: Alert and oriented. No focal neurological deficits. Extremities: Left BKA, right TMA Skin: No rashes Psychiatry: flat affect  Discharge Instructions    Allergies as of 06/07/2024       Reactions   Crestor [rosuvastatin]    Other reaction(s): Muscle Pain   Fentanyl  Itching   Patient received Fentanyl  during procedure and had reaction, medicinal intervention applied.    Lipitor [atorvastatin ]    Other reaction(s): Muscle Pain   Amoxicillin Itching   Did it involve swelling of the face/tongue/throat, SOB, or low BP? Unknown Did it involve sudden or severe rash/hives, skin peeling, or any reaction on the inside of your mouth or nose? Unknown Did you need to seek medical attention at a hospital or doctor's office? Unknown When did it last happen?      20 years ago If all above answers are NO, may proceed with cephalosporin use.   Cefazolin  Other (See Comments), Swelling   LIP SWELLING   Chlorhexidine  Hives   Patient has blistering   Hydromorphone  Itching   Patient states he may take with benadryl . Makes feet itch.   Oxycodone  Rash, Other (See Comments)   Percocet [oxycodone -acetaminophen ] Itching, Other  (See Comments)   Legs only. Itching and burning feeling. Can tolerate with hydroxyzine     Morphine     Other reaction(s): rash/itching All narcotics per pt   Gabapentin Nausea And Vomiting   Stomach issues         Medication List     STOP taking these medications    Entresto  24-26 MG Generic drug: sacubitril -valsartan    isosorbide  mononitrate 30 MG 24 hr tablet Commonly known as: IMDUR    metoprolol  succinate 100 MG 24 hr tablet Commonly known as: Toprol  XL   Xphozah 30 MG Tabs Generic drug: Tenapanor HCl (CKD)       TAKE these medications    aspirin  EC 81 MG tablet Take 1 tablet (81 mg total) by mouth daily.   EpiPen  2-Pak 0.3 mg/0.3 mL Soaj injection Generic drug: EPINEPHrine  Inject 0.3 mg into the muscle as needed for anaphylaxis.   FreeStyle [employer] 14 Day Sensor Misc See admin instructions.   hydrOXYzine  50 MG capsule Commonly known as: VISTARIL  Take 50 mg by mouth every 6 (six) hours as needed for itching.   Lokelma  10 g Pack packet Generic drug: sodium zirconium cyclosilicate  Take 10 g by mouth every Tuesday, Thursday, Saturday, and Sunday.   multivitamin Tabs tablet Take 1 tablet by mouth daily with lunch.   nitroGLYCERIN  0.4 MG SL tablet Commonly known as: NITROSTAT  Place 0.4  mg under the tongue every 5 (five) minutes as needed for chest pain.   ondansetron  8 MG disintegrating tablet Commonly known as: ZOFRAN -ODT Take 8 mg by mouth 3 (three) times daily.   pantoprazole  40 MG tablet Commonly known as: PROTONIX  Take 40 mg by mouth daily.   prednisoLONE  acetate 1 % ophthalmic suspension Commonly known as: PRED FORTE  Place 1 drop into the right eye daily.   sevelamer  carbonate 800 MG tablet Commonly known as: RENVELA  Take 1,600-2,400 mg by mouth 3 (three) times daily with meals.   traZODone  150 MG tablet Commonly known as: DESYREL  Take 150 mg by mouth at bedtime.   triamcinolone cream 0.1 % Commonly known as: KENALOG Apply 1 Application  topically 2 (two) times daily as needed (ITCHING).   Zylet  0.5-0.3 % Susp Generic drug: Loteprednol -Tobramycin  Place 1 drop into the right eye daily.       Allergies  Allergen Reactions   Crestor [Rosuvastatin]     Other reaction(s): Muscle Pain   Fentanyl  Itching    Patient received Fentanyl  during procedure and had reaction, medicinal intervention applied.    Lipitor [Atorvastatin ]     Other reaction(s): Muscle Pain   Amoxicillin Itching    Did it involve swelling of the face/tongue/throat, SOB, or low BP? Unknown Did it involve sudden or severe rash/hives, skin peeling, or any reaction on the inside of your mouth or nose? Unknown Did you need to seek medical attention at a hospital or doctor's office? Unknown When did it last happen?      20 years ago If all above answers are NO, may proceed with cephalosporin use.    Cefazolin  Other (See Comments) and Swelling    LIP SWELLING   Chlorhexidine  Hives    Patient has blistering   Hydromorphone  Itching    Patient states he may take with benadryl . Makes feet itch.   Oxycodone  Rash and Other (See Comments)   Percocet [Oxycodone -Acetaminophen ] Itching and Other (See Comments)    Legs only. Itching and burning feeling. Can tolerate with hydroxyzine     Morphine      Other reaction(s): rash/itching All narcotics per pt   Gabapentin Nausea And Vomiting    Stomach issues       The results of significant diagnostics from this hospitalization (including imaging, microbiology, ancillary and laboratory) are listed below for reference.    Significant Diagnostic Studies: HYBRID OR IMAGING ([facility] ONLY) Result Date: 06/07/2024 There is no interpretation for this exam.  This order is for images obtained during a surgical procedure.  Please See Surgeries Tab for more information regarding the procedure.   CARDIAC CATHETERIZATION Result Date: 06/03/2024 Findings: RA = 1 RV = 44/5 PA = 43/12 (25) PCW = 7 Fick cardiac output/index = 4.6/2.4  Thermo CO/CI = 4.7/2.4 PVR = 3.9 Ao sat = 95% PA sat = 60%, 61% PAPi = 31 Assessment: 1. Much improved hemodynamics 2. Mild PAH with normal left-sided filling pressures and CO Plan/Discussion: Continue current plan. Ok for d/c today from cardiology standpoint. Toribio Fuel, MD 8:40 AM  CARDIAC CATHETERIZATION Result Date: 05/31/2024 Images from the original result were not included. Coronary angiography 05/31/2024: LM: No significant disease LAD: Occluded mid LAD stent        Faint collaterals filling distal LAD        Diag 2 with 80% stenosis Ramus: Small caliber vessel, prox 90% stenosis Lcx: Prox 70%, mid 80% stenoses. Occluded OM 3 stent RCA: Large, dominant vessel  Mid focal 60% stenosis         LVEDP 22 mmHg Right heart catheterization 05/31/2024: RA: 22 mmHg RV: 105/10 mmHg PA: 109/56 mmHg, mPAP 81 mmHg PCW: 35 mmHg AO sats: 96% PA sats: 41% Fick: CO: 3.1 L/min CI: 1.6 L/min/m2 Thermodilution: CO: 3.0 L/min CI: 1.5 L/min/m2 Conclusion: Severe multivessel CAD with occluded prior stents in the LAD and left circumflex Territory supplied by these vessels is largely infarcted based on stress test findings.  Therefore, I do not think he will benefit from revascularization to these vessels. 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 Recommend medical admission, with heart failure team input (Dr. Zenaida already aware) [name], MD   US  RENAL Result Date: 05/19/2024 CLINICAL DATA:  KIDNEY CYST, end-stage renal disease on dialysis. EXAM: RENAL / URINARY TRACT ULTRASOUND COMPLETE COMPARISON:  April 07, 2024 FINDINGS: Right Kidney: Renal measurements: 10.4 x 6.4 x 3.8 cm = volume: 134 mL. Cortical thinning with increased echogenicity. Multiple renal cysts measuring up to 2.4 x 2.3 x 2.5 cm. There is a more solid-appearing lesion in the upper pole measuring 2.2 x 1.9 x 2 cm. No  hydronephrosis or nephrolithiasis. Left Kidney: Renal measurements: 11.1 x 5.7 x 4.2 cm = volume: 141 mL. Cortical thinning with otherwise normal echogenicity. Multiple renal cysts present measuring up to 2.2 x 2.1 x 2.2 cm. No hydronephrosis or nephrolithiasis. Bladder: Completely decompressed and not well evaluated. Other: Large amount of biliary sludge with punctate gallstones in the gallbladder. IMPRESSION: 1. No hydronephrosis or nephrolithiasis. Renal parenchymal changes consistent with chronic medical renal disease. 2. Multiple bilateral renal cysts. There is a more solid-appearing lesion in the upper pole of the right kidney, measuring 2.2 x 1.9 x 2 cm. A follow-up CT of the abdomen and pelvis with IV contrast could be considered to evaluate for underlying neoplasm. Electronically Signed   By: Rogelia Myers M.D.   On: 05/19/2024 17:40   Myocardial Perfusion Imaging Result Date: 05/17/2024   Findings are consistent with infarction with peri-infarct ischemia. The study is high risk.   No ST deviation was noted. The ECG was not diagnostic due to pharmacologic protocol.   LV perfusion is abnormal. There is no evidence of ischemia. There is evidence of infarction. Defect 1: There is a large defect with severe reduction in uptake present in the apical to basal inferior, inferolateral and apex location(s) that is fixed. There is abnormal wall motion in the defect area. Consistent with infarction and peri-infarct ischemia.   Left ventricular function is abnormal. Global function is moderately reduced. There was a single regional abnormality. Nuclear stress EF: 32%. The left ventricular ejection fraction is moderately decreased (30-44%). End diastolic cavity size is severely enlarged. End systolic cavity size is severely enlarged.   Prior study available for comparison from 09/09/2013.   ECG rhythm shows normal sinus rhythm. Large size, severe severity fixed basal to apical inferior, inferolateral and apical  perfusion defect, suggestive of LCX territory scar without significant peri-infarct ischemia. LVEF 32% with inferolateral and apical akinesis. This is high risk study (due to LVEF <35% and large territory of scar). Compared to a prior study in 2014, the LVEF is lower and new regional perfusion defects are noted. This correlates with inferior and inferolateral akinesis on recent echo (my read).   ECHOCARDIOGRAM COMPLETE Result Date: 05/17/2024    ECHOCARDIOGRAM REPORT   Patient Name:   RONNELL CLINGER  Date of Exam: 05/17/2024 Medical Rec #:  [id number]     Height:       70.0 in Accession #:    [id number]    Weight:       160.0 lb Date of Birth:  Apr 20, 1966      BSA:          1.898 m Patient Age:    58 years      BP:           103/67 mmHg Patient Gender: M             HR:           68 bpm. Exam Location:  [address] Procedure: 2D Echo, 3D Echo, Cardiac Doppler, Color Doppler and Strain Analysis            (Both Spectral and Color Flow Doppler were utilized during            procedure). Indications:    R01.1 Murmur  History:        Patient has no prior history of Echocardiogram examinations.                 CAD; Risk Factors:Diabetes, Hypertension and Dyslipidemia.                 Pulmonary hypertension.  Sonographer:    Carl Coma RDCS Referring Phys: [id number] [facility] J PATWARDHAN IMPRESSIONS  1. Left ventricular ejection fraction, by estimation, is 30 to 35%. Left ventricular ejection fraction by 3D volume is 30 %. The left ventricle has moderately decreased function. The left ventricle demonstrates global hypokinesis. Left ventricular diastolic function could not be evaluated. The average left ventricular global longitudinal strain is -11.2 %. The global longitudinal strain is abnormal.  2. Right ventricular systolic function is normal. The right ventricular size is mildly enlarged.  3. Left atrial size was moderately dilated.  4. A small pericardial effusion is present. The pericardial effusion is  circumferential.  5. The mitral valve is normal in structure. Mild to moderate mitral valve regurgitation. No evidence of mitral stenosis. Moderate to severe mitral annular calcification.  6. Tricuspid valve regurgitation is severe.  7. The aortic valve is calcified. There is moderate calcification of the aortic valve. There is moderate thickening of the aortic valve. Aortic valve regurgitation is not visualized. Low flow low gradient moderate to severe. Aortic valve area, by VTI measures 0.96 cm. Aortic valve mean gradient measures 9.8 mmHg. Aortic valve Vmax measures 2.14 m/s,SVI 22, DI 0.34, Indexed AVA 0.51 cm2/m2.  8. The inferior vena cava is normal in size with greater than 50% respiratory variability, suggesting right atrial pressure of 3 mmHg. FINDINGS  Left Ventricle: Left ventricular ejection fraction, by estimation, is 30 to 35%. Left ventricular ejection fraction by 3D volume is 30 %. The left ventricle has moderately decreased function. The left ventricle demonstrates global hypokinesis. The average left ventricular global longitudinal strain is -11.2 %. Strain was performed and the global longitudinal strain is abnormal. The left ventricular internal cavity size was normal in size. There is no left ventricular hypertrophy. Left ventricular diastolic function could not be evaluated due to mitral annular calcification (moderate or greater). Left ventricular diastolic function could not be evaluated. Right Ventricle: The right ventricular size is mildly enlarged. No increase in right ventricular wall thickness. Right ventricular systolic function is normal. Left Atrium: Left atrial size was moderately dilated. Right Atrium: Right atrial size was normal in size. Pericardium: A small pericardial effusion is  present. The pericardial effusion is circumferential. Mitral Valve: The mitral valve is normal in structure. Moderate to severe mitral annular calcification. Mild to moderate mitral valve  regurgitation. No evidence of mitral valve stenosis. Tricuspid Valve: The tricuspid valve is normal in structure. Tricuspid valve regurgitation is severe. No evidence of tricuspid stenosis. The aortic valve is calcified. There is moderate calcification of the aortic valve. There is moderate thickening of the aortic valve. There is moderate aortic valve annular calcification. Aortic valve regurgitation is not visualized. Low flow low gradient moderate to severe. Pulmonic Valve: The pulmonic valve was normal in structure. Pulmonic valve regurgitation is mild. No evidence of pulmonic stenosis. Aorta: The aortic root is normal in size and structure. Venous: The inferior vena cava is normal in size with greater than 50% respiratory variability, suggesting right atrial pressure of 3 mmHg. IAS/Shunts: No atrial level shunt detected by color flow Doppler. Additional Comments: 3D was performed not requiring image post processing on an independent workstation and was abnormal.  LEFT VENTRICLE PLAX 2D LVIDd:         5.80 cm         Diastology LVIDs:         4.30 cm         LV e' medial:    4.35 cm/s LV PW:         0.90 cm         LV E/e' medial:  26.8 LV IVS:        0.90 cm         LV e' lateral:   5.27 cm/s LVOT diam:     1.90 cm         LV E/e' lateral: 22.1 LV SV:         43 LV SV Index:   22              2D Longitudinal LVOT Area:     2.84 cm        Strain                                2D Strain GLS   -11.2 %                                (A4C):                                2D Strain GLS   -10.7 %                                (A3C):                                2D Strain GLS   -11.7 %                                (A2C):                                2D Strain GLS   -11.2 %  Avg:                                 3D Volume EF                                LV 3D EF:    Left                                             ventricul                                             ar                                              ejection                                             fraction                                             by 3D                                             volume is                                             30 %.                                 3D Volume EF:                                3D EF:        30 %                                LV EDV:       207 ml                                LV ESV:       145 ml                                LV SV:        61 ml RIGHT VENTRICLE  IVC RV Basal diam:  4.40 cm    IVC diam: 1.80 cm RV S prime:     9.94 cm/s TAPSE (M-mode): 2.4 cm LEFT ATRIUM             Index        RIGHT ATRIUM           Index LA diam:        5.10 cm 2.69 cm/m   RA Area:     16.10 cm LA Vol (A2C):   90.6 ml 47.72 ml/m  RA Volume:   46.60 ml  24.55 ml/m LA Vol (A4C):   67.1 ml 35.34 ml/m LA Biplane Vol: 82.8 ml 43.61 ml/m  AORTIC VALVE AV Area (Vmax):    0.95 cm AV Area (Vmean):   0.93 cm AV Area (VTI):     0.96 cm AV Vmax:           219.00 cm/s AV Vmean:          146.600 cm/s AV VTI:            0.443 m AV Peak Grad:      19.2 mmHg AV Mean Grad:      9.8 mmHg LVOT Vmax:         73.60 cm/s LVOT Vmean:        48.250 cm/s LVOT VTI:          0.150 m LVOT/AV VTI ratio: 0.34  AORTA Ao Root diam: 3.10 cm Ao Asc diam:  3.30 cm MITRAL VALVE                TRICUSPID VALVE MV Area (PHT): 4.74 cm     TR Peak grad:   71.6 mmHg MV Decel Time: 160 msec     TR Vmax:        423.00 cm/s MR Peak grad: 130.0 mmHg MR Mean grad: 87.0 mmHg     SHUNTS MR Vmax:      570.00 cm/s   Systemic VTI:  0.15 m MR Vmean:     445.5 cm/s    Systemic Diam: 1.90 cm MV E velocity: 116.50 cm/s MV A velocity: 63.00 cm/s MV E/A ratio:  1.85 Kardie Tobb DO Electronically signed by Dub Huntsman DO Signature Date/Time: 05/17/2024/3:13:47 PM    Final     Microbiology: No results found for this or any previous visit (from the past 240 hours).   Labs: Basic Metabolic Panel: Recent Labs  Lab  05/31/24 1729 06/01/24 0221 06/03/24 0235 06/03/24 0827 06/03/24 9167 06/04/24 0233 06/05/24 0240 06/06/24 0343 06/07/24 0316  NA 134*   < > 139   < > 142 138 137 135 137  K 6.2*   < > 4.3   < > 3.7 4.5 4.8 5.6* 4.8  CL 98   < > 97*  --   --  97* 98 97* 97*  CO2 20*   < > 27  --   --  24 23 23 27   GLUCOSE 206*   < > 199*  --   --  260* 293* 145* 96  BUN 43*   < > 35*  --   --  54* 71* 83* 58*  CREATININE 8.89*   < > 6.52*  --   --  8.53* 9.95* 11.17* 8.63*  CALCIUM  9.4   < > 8.9  --   --  8.9 9.1 9.4 9.2  MG 2.5*  --   --   --   --   --   --   --   --  PHOS  --   --   --   --   --  5.2*  --   --   --    < > = values in this interval not displayed.   Liver Function Tests: Recent Labs  Lab 05/31/24 1729 06/01/24 0221  AST 23 16  ALT 15 13  ALKPHOS 102 91  BILITOT 1.1 1.5*  PROT 6.9 6.3*  ALBUMIN  3.9 3.7   No results for input(s): LIPASE, AMYLASE in the last 168 hours. No results for input(s): AMMONIA in the last 168 hours. CBC: Recent Labs  Lab 05/31/24 1729 06/02/24 0320 06/03/24 0235 06/03/24 0827 06/03/24 0828 06/03/24 0832 06/06/24 1833  WBC 3.6* 2.8* 3.0*  --   --   --  4.1  NEUTROABS 3.2  --   --   --   --   --   --   HGB 10.5* 11.0* 12.0* 11.6* 11.2* 10.2* 9.9*  HCT 32.8* 33.0* 37.4* 34.0* 33.0* 30.0* 30.7*  MCV 86.1 84.6 84.2  --   --   --  83.7  PLT 80* 154 123*  --   --   --  129*   Cardiac Enzymes: No results for input(s): CKTOTAL, CKMB, CKMBINDEX, TROPONINI in the last 168 hours. BNP: BNP (last 3 results) No results for input(s): BNP in the last 8760 hours.  ProBNP (last 3 results) No results for input(s): PROBNP in the last 8760 hours.  CBG: Recent Labs  Lab 06/06/24 0644 06/06/24 1116 06/06/24 1606 06/07/24 0622 06/07/24 0909  GLUCAP 116* 107* 97 175* 158*       Signed:  Sigurd Pac MD.  [facility] 06/07/2024, 12:30 PM

## 2024-06-07 NOTE — Progress Notes (Signed)
  Progress Note    06/07/2024 9:46 AM * Day of Surgery *  ESRD in need of new access Scheduled for OR today for [facility] insertion and left AV graft   Risks and benefits reviewed. Elects to proceed   Norman GORMAN Serve, MD [facility] [phone] 06/07/2024 9:46 AM

## 2024-06-07 NOTE — Anesthesia Postprocedure Evaluation (Signed)
 Anesthesia Post Note  Patient: [name]  Procedure(s) Performed: INSERTION LEFT UPPER ARM ARTERIOVENOUS GRAFT (Left: Arm Upper) INSERTION OF DIALYSIS CATHETER (Left: Chest) ULTRASOUND GUIDANCE, FOR VASCULAR ACCESS (Left: Chest)     Patient location during evaluation: PACU Anesthesia Type: General Level of consciousness: awake and alert, oriented and patient cooperative Pain management: pain level controlled Vital Signs Assessment: post-procedure vital signs reviewed and stable Respiratory status: spontaneous breathing, nonlabored ventilation and respiratory function stable Cardiovascular status: blood pressure returned to baseline and stable Postop Assessment: no apparent nausea or vomiting Anesthetic complications: no Comments: Itching/burning of B/L LE c/w neuropathic pain he has experienced in the past in PACU treated with benadryl  with some resolution, requesting hydroxyzine  which will be given on the floor. After resolution of some itching, then c/o chest heaviness/itching- normal 12 lead EKG.   Femoral art line placed urgently intraoperatively when we were unable to get any NIBP or clearsight readings. Art line removed by myself in PACU, small hematoma formed. Pressure dressing applied.   No notable events documented.  Last Vitals:  Vitals:   06/07/24 1430 06/07/24 1445  BP: (!) 129/99 (!) 113/55  Pulse: (!) 101 90  Resp: 20 11  Temp: 36.5 C   SpO2: 100% 98%    Last Pain:  Vitals:   06/07/24 1430  TempSrc:   PainSc: 0-No pain                 [name]

## 2024-06-07 NOTE — Op Note (Signed)
 OPERATIVE NOTE  PROCEDURE:   Fluoroscopic and ultrasound-guided left IJ TDC left arm brachial - axillary AVG creation  PRE-OPERATIVE DIAGNOSIS: ESRD  POST-OPERATIVE DIAGNOSIS: same as above   SURGEON: Norman GORMAN Serve MD  ASSISTANT(S): Curry Damme, PA  Given the complexity of the case,  the assistant was necessary in order to expedient the procedure and safely perform the technical aspects of the operation.  The assistant provided traction and countertraction to assist with exposure of the artery and vein.  They also assisted with suture ligation of multiple venous branches. They also assisted with tunneling of the graft.  They played a critical role for both anastomoses. These skills, especially following the Prolene suture for the anastomosis, could not have been adequately performed by a scrub tech assistant.  ANESTHESIA: general  ESTIMATED BLOOD LOSS: 50 cc  FINDING(S): Significantly diseased and calcified left brachial artery Palpable and doppler thrill in AVG with monophasic radial artery on completion He did have significant oozing from needle holes and drying up to greater than 30 minutes with multiple different hemostatic agents and administration of DDAVP .  SPECIMEN(S):  none  INDICATIONS:   [name] is a 58 y.o. male with ESRD. The patient is currently on dialysis via R arm AVF.  His right arm fistula is functioning poorly and is severely calcified.  He was originally planned for outpatient surgery for a tunneled dialysis catheter left arm access although he has severe heart failure and currently admitted and has been optimized.  The risks and benefits of access creation were reviewed including: need for additional procedures, need for additional creations, steal, ischemia monomelic neuropathy, failure of access, and bleeding. The patient expressed understand and is willing to proceed.    DESCRIPTION: The patient was brought to the operating room positioned  supine on the operating table.  The left arm and neck was prepped and draped in usual sterile fashion.  Timeout was performed and preoperative antibiotics were administered.  Using ultrasound guidance the left internal jugular vein was accessed with micropuncture technique.  Through the micropuncture sheath, the guidewire was advanced into the superior vena cava.  A small incision was made around the skin access point.  The access point was serially dilated under direct fluoroscopic guidance.  A peel-away sheath was introduced into the superior vena cava under fluoroscopic guidance.  A counterincision was made in the chest under the clavicle.  A 23 cm tunnel dialysis catheter was then tunneled under the skin, over the clavicle into the incision in the neck.  The tunneling device was removed and the catheter fed through the peel-away sheath into the superior vena cava.  The peel-away sheath was removed and the catheter gently pulled back.  Adequate position was confirmed with x-ray.  The catheter was tested and found to aspirate and flush with ease.    The catheter was sutured to the skin and the neck incision was closed with 4-0 Monocryl.  The catheter was then capped and heparin  locked.  The left brachial vein at the axilla was exposed using longitudinal incision.  Incision was carried down until the brachial sheath was encountered.  The brachial vein was identified, exposed, encircled with a Silastic Vesseloop. The brachial artery was exposed using a longitudinal incision in the distal arm just above the antecubital fossa.  Incision was carried down through subcutaneous tissue until the brachial sheath was encountered.  This was incised sharply.  The brachial artery was exposed and encircled with a Silastic Vesseloop. Using  a curved, sheathed tunneling device, a 4-7 mm tapered Gore-Tex graft was tunneled subcutaneously in a gentle arc across the biceps.  The patient was then heparinized with 3000 units of IV  heparin .   The brachial artery was clamped proximally and distally.  An anterior arteriotomy was made with an 11 blade.  This was extended with Potts scissors.  The 4 mm end of the Gore-Tex graft was spatulated and then anastomosed end-to-side to the brachial arteriotomy using continuous running suture of 6-0 Prolene.  The anastomosis was completed and hemostasis ensured.  The brachial artery clamps were removed and the graft was clamped restoring perfusion to the hand. The brachial vein was clamped proximally and distally.  An anterior venotomy was made with an 11 blade.  This was extended with Potts scissors.  The 7 mm end of the Gore-Tex graft was then anastomosed end to side to the brachial vein venotomy using continuous running suture of 6-0 Prolene. Immediately prior to completion the anastomosis was de-aired and flushed.  Anastomosis was then completed.  There was significant oozing from needle holes and drying up took greater than 30 minutes with multiple different hemostatic agents and administration of DDAVP .   A doppler machine was brought onto the field to interrogate the graft.  Monophasic doppler flow was noted in the radial artery. Distal to the venous anastomosis a Doppler bruit was heard. The incisions were copiously irrigated and then closed in layers with 3-0 Vicryl and 4-0 Monocryl for the skin. Dermabond was applied. All counts were correct at the end of the case. The patient tolerated the procedure well and was brought to recovery in stable condition.    COMPLICATIONS: None apparent  CONDITION: Stable  Norman GORMAN Serve MD [facility] Phone Number: [phone] 06/07/2024 1:50 PM

## 2024-06-07 NOTE — Anesthesia Procedure Notes (Signed)
 Arterial Line Insertion Start/End8/19/2025 11:30 AM, 06/07/2024 11:35 AM Performed by: Merla Almarie HERO, DO, anesthesiologist  Patient location: OR. Preanesthetic checklist: patient identified, IV checked, site marked, risks and benefits discussed, surgical consent, monitors and equipment checked, pre-op  evaluation, timeout performed and anesthesia consent Lidocaine  1% used for infiltration Right, femoral was placed Catheter size: 20 G Hand hygiene performed  and maximum sterile barriers used   Attempts: 1 Procedure performed using ultrasound guided technique. Ultrasound Notes:anatomy identified, needle tip was noted to be adjacent to the nerve/plexus identified and no ultrasound evidence of intravascular and/or intraneural injection Following insertion, dressing applied. Post procedure assessment: normal and unchanged  Patient tolerated the procedure well with no immediate complications. Additional procedure comments: Emergent line placed intra-op, no image printed.

## 2024-06-07 NOTE — Anesthesia Procedure Notes (Signed)
 Procedure Name: Intubation Date/Time: 06/07/2024 10:50 AM  Performed by: Dalessandro Baldyga J, CRNAPre-anesthesia Checklist: Patient identified, Emergency Drugs available, Suction available and Patient being monitored Patient Re-evaluated:Patient Re-evaluated prior to induction Oxygen  Delivery Method: Circle System Utilized Preoxygenation: Pre-oxygenation with 100% oxygen  Induction Type: IV induction Ventilation: Mask ventilation without difficulty Laryngoscope Size: Miller and 3 Grade View: Grade I Tube type: Oral Tube size: 7.5 mm Number of attempts: 1 Airway Equipment and Method: Stylet and Oral airway Placement Confirmation: ETT inserted through vocal cords under direct vision, positive ETCO2 and breath sounds checked- equal and bilateral Secured at: 23 cm Tube secured with: Tape Dental Injury: Teeth and Oropharynx as per pre-operative assessment

## 2024-06-08 ENCOUNTER — Encounter (HOSPITAL_COMMUNITY): Payer: Self-pay | Admitting: Vascular Surgery

## 2024-06-08 DIAGNOSIS — I2722 Pulmonary hypertension due to left heart disease: Secondary | ICD-10-CM | POA: Insufficient documentation

## 2024-06-08 DIAGNOSIS — I255 Ischemic cardiomyopathy: Secondary | ICD-10-CM | POA: Insufficient documentation

## 2024-06-08 DIAGNOSIS — Z9889 Other specified postprocedural states: Secondary | ICD-10-CM

## 2024-06-08 LAB — GLUCOSE, CAPILLARY
Glucose-Capillary: 79 mg/dL (ref 70–99)
Glucose-Capillary: 68 mg/dL — ABNORMAL LOW (ref 70–99)

## 2024-06-08 LAB — BASIC METABOLIC PANEL WITH GFR
Anion gap: 14 (ref 5–15)
BUN: 76 mg/dL — ABNORMAL HIGH (ref 6–20)
CO2: 25 mmol/L (ref 22–32)
Calcium: 9 mg/dL (ref 8.9–10.3)
Chloride: 97 mmol/L — ABNORMAL LOW (ref 98–111)
Creatinine, Ser: 10.17 mg/dL — ABNORMAL HIGH (ref 0.61–1.24)
GFR, Estimated: 5 mL/min — ABNORMAL LOW (ref >60)
Glucose, Bld: 74 mg/dL (ref 70–99)
Potassium: 5 mmol/L (ref 3.5–5.1)
Sodium: 136 mmol/L (ref 135–145)

## 2024-06-08 MED ORDER — HEPARIN SODIUM (PORCINE) 1000 UNIT/ML IJ SOLN
INTRAMUSCULAR | Status: AC
  Filled 2024-06-08: qty 4

## 2024-06-08 MED ORDER — PENTAFLUOROPROP-TETRAFLUOROETH EX AERO
1.0000 | INHALATION_SPRAY | CUTANEOUS | Status: DC | PRN
Start: 2024-06-08 — End: 2024-06-08

## 2024-06-08 MED ORDER — LIDOCAINE-PRILOCAINE 2.5-2.5 % EX CREA
1.0000 | TOPICAL_CREAM | CUTANEOUS | Status: DC | PRN
Start: 2024-06-08 — End: 2024-06-08

## 2024-06-08 MED ORDER — HEPARIN SODIUM (PORCINE) 1000 UNIT/ML DIALYSIS
1000.0000 [IU] | INTRAMUSCULAR | Status: DC | PRN
  Administered 2024-06-08: 1000 [IU]

## 2024-06-08 MED ORDER — LIDOCAINE HCL (PF) 1 % IJ SOLN: 5.0000 mL | INTRAMUSCULAR | Status: DC | PRN

## 2024-06-08 MED ORDER — ANTICOAGULANT SODIUM CITRATE 4% (200MG/5ML) IV SOLN: 5.0000 mL | Status: DC | PRN

## 2024-06-08 MED ORDER — ALTEPLASE 2 MG IJ SOLR: 2.0000 mg | Freq: Once | INTRAMUSCULAR | Status: DC | PRN

## 2024-06-08 MED ORDER — ALBUMIN HUMAN 25 % IV SOLN
25.0000 g | Freq: Once | INTRAVENOUS | Status: AC
  Administered 2024-06-08: 25 g via INTRAVENOUS

## 2024-06-08 MED ORDER — ONDANSETRON HCL 4 MG/2ML IJ SOLN
INTRAMUSCULAR | Status: AC
Start: 2024-06-08 — End: 2024-06-08
  Filled 2024-06-08: qty 2

## 2024-06-08 NOTE — Progress Notes (Signed)
 [facility] Progress Note   Subjective:   Seen in HD today. He reports some pain in his left arm from his AVF procedure yesterday. Plan for 3.5 L UF during HD today. He reported that he had an adverse reaction from the Lyrica that he received. His lips were very chapped and bleeding. K 5.0 today pre-HD  Objective Vitals:   06/08/24 0845 06/08/24 0900 06/08/24 0915 06/08/24 0930  BP:  (!) 78/67 (!) 87/61 (!) 102/36  Pulse:   61   Resp: (!) 22 17 16  (!) 21  Temp:      TempSrc:      SpO2:   100%   Weight:      Height:       Physical Exam General: Alert, sitting up in wc, nad Heart: RRR Lungs:CTAB, nml WOB on RA Abdomen:soft, NTND Extremities:no LE edema, L BKA, LUE sore with limited ROM Dialysis Access: RU AVF +b/t   Filed Weights   06/07/24 0946 06/08/24 0500 06/08/24 0814  Weight: 76.2 kg 75 kg 74.8 kg    Intake/Output Summary (Last 24 hours) at 06/08/2024 1011 Last data filed at 06/07/2024 2258 Gross per 24 hour  Intake 423 ml  Output 5 ml  Net 418 ml    Additional Objective Labs: Basic Metabolic Panel: Recent Labs  Lab 06/04/24 0233 06/05/24 0240 06/06/24 0343 06/07/24 0316 06/08/24 0250  NA 138   < > 135 137 136  K 4.5   < > 5.6* 4.8 5.0  CL 97*   < > 97* 97* 97*  CO2 24   < > 23 27 25   GLUCOSE 260*   < > 145* 96 74  BUN 54*   < > 83* 58* 76*  CREATININE 8.53*   < > 11.17* 8.63* 10.17*  CALCIUM  8.9   < > 9.4 9.2 9.0  PHOS 5.2*  --   --   --   --    < > = values in this interval not displayed.   Liver Function Tests: No results for input(s): AST, ALT, ALKPHOS, BILITOT, PROT, ALBUMIN  in the last 168 hours.  CBC: Recent Labs  Lab 06/02/24 0320 06/03/24 0235 06/03/24 0827 06/03/24 0828 06/03/24 0832 06/06/24 1833  WBC 2.8* 3.0*  --   --   --  4.1  HGB 11.0* 12.0*   < > 11.2* 10.2* 9.9*  HCT 33.0* 37.4*   < > 33.0* 30.0* 30.7*  MCV 84.6 84.2  --   --   --  83.7  PLT 154 123*  --   --   --  129*   < > = values in this  interval not displayed.   Medications:  albumin  human     anticoagulant sodium citrate       aspirin  EC  81 mg Oral Daily   heparin  injection (subcutaneous)  5,000 Units Subcutaneous Q8H   insulin  aspart  0-6 Units Subcutaneous TID WC   insulin  aspart  4 Units Subcutaneous TID WC   insulin  glargine-yfgn  10 Units Subcutaneous Daily   lactulose   20 g Oral BID   pantoprazole   40 mg Oral Daily   polyethylene glycol  17 g Oral Daily   senna-docusate  1 tablet Oral BID   sevelamer  carbonate  1,600 mg Oral TID WC   sodium chloride  flush  3 mL Intravenous Q12H   sodium chloride  flush  3 mL Intravenous Q12H   traZODone   150 mg Oral QHS    Dialysis Orders: [facility]  on MWF . New EDW 72 kg HD Bath 1K/2.5Ca  Time 4:00 Heparin  none. Access RUE AVF BFR 400 DFR 800    Hectoral 3 mcg IV/HD   Assessment/Plan: Acute on chronic HFrEF/ICM/ Valvular heart disease/multivessel CAD.  Echo EF 30-35%. s/p R/LHC. No targets for PCI on LHC.  Hemodynamics improved on RHC after additional UF. HF team following -->started on entresto . No BB due to low output.  Vascular access - VVS planning new L AVG and TDC placement on 06/07/24.  ESRD -  HD MWF.  HD today.  Hypertension. BP better.- Entresto  49-51mg  started by HF team.  Volume. EDW lowered to 72 kg. Continue UF as tolerated.  Anemia  - Hgb 9.9. no indication for ESA at this time.  Metabolic bone disease -   Calcium  and phos in goal. continue with home meds Nutrition - Currently on regular diet. Added fluid restrictions.  Monitor labs closely, if K becomes elevated will need to change to renal diet.  Dispo: probable d/c today  Belvie Och, NP  [facility] 06/08/2024,10:11 AM

## 2024-06-08 NOTE — Care Management Important Message (Signed)
 Important Message  Patient Details  Name: [name] MRN: [id number] Date of Birth: 17-Feb-1966   Important Message Given:  Yes - Medicare IM     [name] 06/08/2024, 11:55 AM

## 2024-06-08 NOTE — Progress Notes (Signed)
 Patient had HD today with good toleration, plan to discharge home today and follow up as outpatient.  BP (!) 122/39   Pulse (!) 143   Temp 97.9 F (36.6 C)   Resp 14   Ht 5' 10 (1.778 m)   Wt 71.4 kg   SpO2 92%   BMI 22.59 kg/m

## 2024-06-08 NOTE — Progress Notes (Signed)
  Progress Note    06/08/2024 1:29 PM 1 Day Post-Op  1 day postop from left TDC insertion and left arm brachial axillary graft. Doing well, incisions look good.  Underwent dialysis via left IJ [facility] which ran without any issues. No hand numbness or motor dysfunction. Okay for discharge from vascular surgery perspective.  Okay to access graft in 4 weeks.   Norman GORMAN Serve, MD [facility] [phone] 06/08/2024 1:29 PM

## 2024-06-08 NOTE — Discharge Planning (Signed)
 [facility] Patient Discharge Orders - [facility] CLINIC: [place]  Patient's name: [name] Admit/DC Dates: 05/31/2024 - 06/08/24  DISCHARGE DIAGNOSES: Acute on chronic systolic heart failure (HCC), Severe pulmonary hypertension, Valvular heart disease, severe AS, moderate MR, severe TR: echo 04/2024 with new systolic findings of EF of 30-35%.  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 LHC 8/12 with findings of severe multivessel CAD with occluded prior stents in the LAD and left circumflex, RHC RA 22, PA 109/56, wedge 35, cardiac index 1.6, SVR greater than 2400. Decompensated ischemic CM. Severe pulmonary HTN  Complication of AV dialysis fistula: New L AVG created 06/08/24 by Dr. Pearline  HD ORDER CHANGES: Heparin  change: no heparin  EDW Change: yes New EDW: 72 kg Bath Change: no  ANEMIA MANAGEMENT: Aranesp : Given: no    ESA dose for discharge: mircera per protocol IV Iron  dose at discharge: per protocol Transfusion: Given: no  BONE/MINERAL MEDICATIONS: Hectorol /Calcitriol change: per protocol Sensipar/Parsabiv change: per protocol  ACCESS INTERVENTION/CHANGE: yes Details: New L UA AVG created 06/08/24 by Dr. Pearline   RECENT LABS: Recent Labs  Lab 06/04/24 0233 06/05/24 0240 06/06/24 1833 06/07/24 0316 06/08/24 0250  HGB  --   --  9.9*  --   --   NA 138   < >  --    < > 136  K 4.5   < >  --    < > 5.0  CALCIUM  8.9   < >  --    < > 9.0  PHOS 5.2*  --   --   --   --    < > = values in this interval not displayed.    IV ANTIBIOTICS: no Details:   OTHER/APPTS/LAB ORDERS: Patient needs updated labs at his next HD session  D/C Meds to be reconciled by nurse after every discharge.  Completed By: Belvie Och, NP   Reviewed by: MD:______ RN_______

## 2024-06-08 NOTE — TOC Transition Note (Signed)
 Transition of Care [facility]) - Discharge Note   Patient Details  Name: [name] MRN: [id number] Date of Birth: 08/06/1966  Transition of Care [facility]) CM/SW Contact:  Waddell Barnie Rama, RN Phone Number: 06/08/2024, 12:31 PM   Clinical Narrative:    For possible dc today, HD patient.  He has trransport.     Barriers to Discharge: Continued Medical Work up   Patient Goals and CMS Choice Patient states their goals for this hospitalization and ongoing recovery are:: wants to remain independent          Discharge Placement                       Discharge Plan and Services Additional resources added to the After Visit Summary for     Discharge Planning Services: CM Consult                                 Social Drivers of Health (SDOH) Interventions SDOH Screenings   Food Insecurity: No Food Insecurity (05/31/2024)  Housing: Low Risk  (05/31/2024)  Transportation Needs: No Transportation Needs (05/31/2024)  Utilities: Not At Risk (05/31/2024)  Depression (PHQ2-9): Medium Risk (01/19/2024)  Physical Activity: Inactive (01/19/2024)  Stress: Stress Concern Present (01/19/2024)  Tobacco Use: Low Risk  (06/07/2024)     Readmission Risk Interventions    06/01/2024    1:07 PM  Readmission Risk Prevention Plan  Transportation Screening Complete  PCP or Specialist Appt within 5-7 Days Complete  Home Care Screening Complete  Medication Review (RN CM) Complete

## 2024-06-08 NOTE — Progress Notes (Signed)
 D/c orders noted. Contacted op hd clinic and informed of pt d/c and to anticipate pt back on Friday.   [facility] Dialysis Navigator [phone]

## 2024-06-08 NOTE — Progress Notes (Signed)
 PT Cancellation Note  Patient Details Name: GRADIE OHM MRN: [id number] DOB: Mar 25, 1966   Cancelled Treatment:    Reason Eval/Treat Not Completed: Patient at procedure or test/unavailable, off unit for HD. Will continue to follow and evaluate as time/schedule allows.  Izetta Call, PT, DPT   Acute Rehabilitation Department Office [phone] Secure Chat Communication Preferred   Izetta JULIANNA Call 06/08/2024, 9:48 AM

## 2024-06-08 NOTE — Progress Notes (Signed)
 PT Cancellation Note  Patient Details Name: [name] MRN: [id number] DOB: [birth year]   Cancelled Treatment:    Reason Eval/Treat Not Completed: PT screened, no needs identified, will sign off. I met with patient who reports he is mobilizing at his baseline, but not feeling up to moving at this time. He reports he has all needed DME and has all needed support from family to return home. No further acute PT needs identified at this time. Thank you for the consult.   [name], PT, DPT   Acute Rehabilitation Department Office [phone] Secure Chat Communication Preferred   [name] 06/08/2024, 2:46 PM

## 2024-06-08 NOTE — Progress Notes (Signed)
 Received patient in bed.Alert and oriented x 4. Consent verified.  Access used:Right hd catheter that worked well.Dressing change done today.  Duration of treatment: 3.5 hours.  Uf goal: Met 2.5 iters.  Medicine given: Albumin  25 g.  Hand off to the patient's nurse,back into his room with stable condition via transporter.

## 2024-06-10 ENCOUNTER — Ambulatory Visit (HOSPITAL_COMMUNITY)
Admission: RE | Admit: 2024-06-10 | Discharge: 2024-06-10 | Disposition: A | Discharge status: Home / Self Care | Source: Ambulatory Visit | Attending: Vascular Surgery | Admitting: Vascular Surgery

## 2024-06-10 ENCOUNTER — Encounter: Payer: Self-pay | Admitting: Vascular Surgery

## 2024-06-10 ENCOUNTER — Other Ambulatory Visit: Payer: Self-pay | Admitting: Vascular Surgery

## 2024-06-10 ENCOUNTER — Telehealth: Payer: Self-pay

## 2024-06-10 ENCOUNTER — Ambulatory Visit: Attending: Vascular Surgery | Admitting: Vascular Surgery

## 2024-06-10 VITALS — BP 97/60 | HR 87 | Temp 97.9°F | Resp 18 | Ht 70.0 in | Wt 157.0 lb

## 2024-06-10 DIAGNOSIS — M79603 Pain in arm, unspecified: Secondary | ICD-10-CM | POA: Insufficient documentation

## 2024-06-10 DIAGNOSIS — R209 Unspecified disturbances of skin sensation: Secondary | ICD-10-CM | POA: Diagnosis present

## 2024-06-10 DIAGNOSIS — M79602 Pain in left arm: Secondary | ICD-10-CM | POA: Diagnosis not present

## 2024-06-10 DIAGNOSIS — N186 End stage renal disease: Secondary | ICD-10-CM

## 2024-06-10 NOTE — Telephone Encounter (Signed)
 Patient called reporting left hand s/p AVGraft creation on 8/19 is tingling, numb and cold.  Consulted Dr. Pearline.  Advised Steal US  today.

## 2024-06-10 NOTE — Progress Notes (Signed)
 Patient ID: [name], male   DOB: 1966/01/07, 58 y.o.   MRN: [id number]  Reason for Consult: Triage   Referred by Shelda Atlas, MD  Subjective:     HPI [name] is a 59 y.o. male with a history of significant heart failure and ESRD who recently underwent left arm brachial axillary graft creation and left IJ TDC placement on 06/07/2024.  He was noted to have a significantly diseased brachial artery and was worried about a high risk of steal.  He presents today for an urgent triage visit due to left hand numbness and tingling more on the ulnar aspect of the hand.  He has some mild pain but most of his symptoms are numbness tingling and obviously some incisional pain.  Past Medical History:  Diagnosis Date   Acute osteomyelitis of right foot (HCC) 05/15/2019   Anemia 06/23/2019   Aortic stenosis    Bursitis    right shoulder   Complication of anesthesia    Coronary artery disease    Diabetes with renal manifestations(250.4) 08/23/2013   Diabetic infection of right foot (HCC)    Diabetic retinopathy associated with type 2 diabetes mellitus (HCC) 06/23/2019   ESRD on hemodialysis (HCC) 07/18/2011   Essential hypertension 10/18/2007   Qualifier: Diagnosis of  By: Kassie MD, Alyce A    History of blood transfusion    HYPERLIPIDEMIA 05/16/2008   Qualifier: Diagnosis of  By: Norleen MD, [name] ORN    MRSA INFECTION 10/18/2007   Qualifier: Diagnosis of  By: Wilhemina KRAFT, Lucy     Other forms of retinal detachment(361.89) 06/23/2019   PAD (peripheral artery disease) (HCC)    PONV (postoperative nausea and vomiting)    Prolonged QT interval    Pulmonary hypertension (HCC)    Secondary hyperparathyroidism (HCC) 06/23/2019   Sepsis (HCC) 05/14/2019   Family History  Problem Relation Age of Onset   Diabetes Mother    Past Surgical History:  Procedure Laterality Date   A/V FISTULAGRAM N/A 12/29/2023   Procedure: A/V Fistulagram;  Surgeon: Norine Manuelita LABOR, MD;  [facility] INVASIVE  CV LAB;  Service: Cardiovascular;  Laterality: N/A;   A/V SHUNT INTERVENTION N/A 03/17/2024   Procedure: A/V SHUNT INTERVENTION;  Surgeon: Gretta Lonni PARAS, MD;  Location: [facility] PV LAB;  Service: Cardiovascular;  Laterality: N/A;  cephalic arch   ABDOMINAL AORTOGRAM N/A 05/17/2019   Procedure: ABDOMINAL AORTOGRAM;  Surgeon: Harvey Carlin BRAVO, MD;  Location: [facility] INVASIVE CV LAB;  Service: Cardiovascular;  Laterality: N/A;   AMPUTATION TOE Right 05/14/2019   Procedure: PARTIAL AMPUTATION SECOND TOE RIGHT FOOT;  Surgeon: Gretel Ozell PARAS, DPM;  Location: [facility] OR;  Service: Podiatry;  Laterality: Right;   AMPUTATION TOE Right 05/23/2019   Procedure: AMPUTATION OF SECOND TOE METATARSAL PHALANGEAL JOINT;  Surgeon: Gretel Ozell PARAS, DPM;  Location: [facility] OR;  Service: Podiatry;  Laterality: Right;   AMPUTATION TOE Right 05/27/2019   Procedure: Amputation Toe;  Surgeon: Gretel Ozell PARAS, DPM;  Location: [facility];  Service: Podiatry;  Laterality: Right;  right third toe   APPLICATION OF WOUND VAC Right 05/27/2019   Procedure: Application Of Wound Vac;  Surgeon: Gretel Ozell PARAS, DPM;  [facility]: [facility] OR;  Service: Podiatry;  Laterality: Right;   AV FISTULA PLACEMENT  06-18-11   Right brachiocephalic AVF   BONE BIOPSY Right 05/14/2019   Procedure: OPEN SUPERFICIAL BONE BIOPSY GREAT TOE;  Surgeon: Gretel Ozell PARAS, DPM;  Location: [facility] OR;  Service: Podiatry;  Laterality: Right;  CARDIAC CATHETERIZATION     CORONARY BALLOON ANGIOPLASTY N/A 09/24/2020   Procedure: CORONARY BALLOON ANGIOPLASTY;  Surgeon: Swaziland, Peter M, MD;  Location: [facility] INVASIVE CV LAB;  Service: Cardiovascular;  Laterality: N/A;   CORONARY PRESSURE/FFR STUDY N/A 09/24/2020   Procedure: INTRAVASCULAR PRESSURE WIRE/FFR STUDY;  Surgeon: Swaziland, Peter M, MD;  Location: [facility] INVASIVE CV LAB;  Service: Cardiovascular;  Laterality: N/A;   CORONARY STENT INTERVENTION N/A 09/24/2020   Procedure: CORONARY STENT INTERVENTION;  Surgeon: Swaziland, Peter M, MD;  Location: [facility]  INVASIVE CV LAB;  Service: Cardiovascular;  Laterality: N/A;   EYE SURGERY     left eye for Laser, diabetic retinopathy   FISTULOGRAM Right 11/06/2011   Procedure: FISTULOGRAM;  Surgeon: Redell LITTIE Door, MD;  Location: [facility] CATH LAB;  Service: Cardiovascular;  Laterality: Right;   HEMATOMA EVACUATION Right Feb. 25, 2014   HEMATOMA EVACUATION Right 12/14/2012   Procedure: EVACUATION HEMATOMA;  Surgeon: Redell LITTIE Door, MD;  Location: [facility] OR;  Service: Vascular;  Laterality: Right;   INCISION AND DRAINAGE Right 05/23/2019   Procedure: INCISION AND DRAINAGE OF RIGHT FOOT DEEP SPACE ABSCESS;  Surgeon: Gretel Ozell PARAS, DPM;  [facility];  Service: Podiatry;  Laterality: Right;   INJECTION OF SILICONE OIL Left 01/09/2023   Procedure: INJECTION OF SILICONE OIL;  Surgeon: Elner Arley LABOR, MD;  Location: [facility] OR;  Service: Ophthalmology;  Laterality: Left;   INSERTION OF ARTERIOVENOUS (AV) ARTEGRAFT ARM Left 06/07/2024   Procedure: INSERTION LEFT UPPER ARM ARTERIOVENOUS GRAFT;  Surgeon: Pearline Norman RAMAN, MD;  Location: [facility] OR;  Service: Vascular;  Laterality: Left;  LEFT UPPER EXTREMITY   INSERTION OF DIALYSIS CATHETER  12/14/2012   Procedure: INSERTION OF DIALYSIS CATHETER;  Surgeon: Redell LITTIE Door, MD;  [facility];  Service: Vascular;;   INSERTION OF DIALYSIS CATHETER Right 01/07/2021   Procedure: INSERTION OF DIALYSIS CATHETER;  Surgeon: Harvey Carlin BRAVO, MD;  Location: [facility] OR;  Service: Vascular;  Laterality: Right;   INSERTION OF DIALYSIS CATHETER Left 01/08/2021   Procedure: INSERTION OF LEFT INTERNAL JUGULAR DIALYSIS CATHETER, tunneled;  Surgeon: Eliza Lonni RAMAN, MD;  Location: [facility] OR;  Service: Vascular;  Laterality: Left;   INSERTION OF DIALYSIS CATHETER Left 06/07/2024   Procedure: INSERTION OF DIALYSIS CATHETER;  Surgeon: Pearline Norman RAMAN, MD;  Location: [facility] OR;  Service: Vascular;  Laterality: Left;  TUNNELED DIALYSIS   IRRIGATION AND DEBRIDEMENT FOOT Right 05/25/2019   Procedure: INCISION AND DRAINAGE  FOOT;  Surgeon: Gretel Ozell PARAS, DPM;  [facility];  Service: Podiatry;  Laterality: Right;   IRRIGATION AND DEBRIDEMENT FOOT Right 05/27/2019   Procedure: IRRIGATION AND DEBRIDEMENT FOOT;  Surgeon: Gretel Ozell PARAS, DPM;  [facility];  Service: Podiatry;  Laterality: Right;   LASER PHOTO ABLATION Left 01/09/2023   Procedure: LASER PHOTO ABLATION;  Surgeon: Elner Arley LABOR, MD;  Location: [facility] OR;  Service: Ophthalmology;  Laterality: Left;   LEFT HEART CATH AND CORONARY ANGIOGRAPHY N/A 09/24/2020   Procedure: LEFT HEART CATH AND CORONARY ANGIOGRAPHY;  Surgeon: Swaziland, Peter M, MD;  Location: [facility] INVASIVE CV LAB;  Service: Cardiovascular;  Laterality: N/A;   LOWER EXTREMITY ANGIOGRAPHY Bilateral 05/17/2019   Procedure: LOWER EXTREMITY ANGIOGRAPHY;  Surgeon: Harvey Carlin BRAVO, MD;  [facility];  Service: Cardiovascular;  Laterality: Bilateral;   PARS PLANA VITRECTOMY Left 01/09/2023   Procedure: PARS PLANA VITRECTOMY WITH 25 GAUGE AND REPAIR OF RETINAL DETACHMENT;  Surgeon: Elner Arley LABOR, MD;  [facility];  Service:  Ophthalmology;  Laterality: Left;   REMOVAL OF A DIALYSIS CATHETER Right 01/08/2021   Procedure: REMOVAL OF TEMPORARY DIALYSIS CATHETER, LEFT FEMORAL VEIN;  Surgeon: Eliza Lonni RAMAN, MD;  Location: [facility] OR;  Service: Vascular;  Laterality: Right;   REVISON OF ARTERIOVENOUS FISTULA Right 12/14/2012   Procedure: REVISON OF upper arm ARTERIOVENOUS FISTULA using 21mmx10cm gortex graft;  Surgeon: Redell LITTIE Door, MD;  Location: [facility] OR;  Service: Vascular;  Laterality: Right;   REVISON OF ARTERIOVENOUS FISTULA Right 12/27/2020   Procedure: RIGHT UPPER EXTREMITY ARTERIOVENOUS FISTULA REVISiON;  Surgeon: Magda Debby SAILOR, MD;  Location: [facility] OR;  Service: Vascular;  Laterality: Right;  PERIPHERAL NERVE BLOCK   RIGHT HEART CATH N/A 06/03/2024   Procedure: RIGHT HEART CATH;  Surgeon: Cherrie Toribio SAUNDERS, MD;  [facility] CV LAB;  Service: Cardiovascular;  Laterality: N/A;    RIGHT/LEFT HEART CATH AND CORONARY ANGIOGRAPHY N/A 08/07/2020   Procedure: RIGHT/LEFT HEART CATH AND CORONARY ANGIOGRAPHY;  Surgeon: Swaziland, Peter M, MD;  Location: [facility] INVASIVE CV LAB;  Service: Cardiovascular;  Laterality: N/A;   RIGHT/LEFT HEART CATH AND CORONARY ANGIOGRAPHY N/A 05/31/2024   Procedure: RIGHT/LEFT HEART CATH AND CORONARY ANGIOGRAPHY;  Surgeon: Elmira Newman PARAS, MD;  [facility] CV LAB;  Service: Cardiovascular;  Laterality: N/A;   SOFT TISSUE MASS EXCISION     Right arm, Left leg  for MRSA infection   ULTRASOUND GUIDANCE FOR VASCULAR ACCESS Left 06/07/2024   Procedure: ULTRASOUND GUIDANCE, FOR VASCULAR ACCESS;  Surgeon: Pearline Norman RAMAN, MD;  Location: [facility] OR;  Service: Vascular;  Laterality: Left;   VENOUS ANGIOPLASTY  12/29/2023   Procedure: VENOUS ANGIOPLASTY;  Surgeon: Norine Manuelita LABOR, MD;  [facility] CV LAB;  Service: Cardiovascular;;  Cephalic Arch; Intragraft; Distal Edge of Stent   VENOUS ANGIOPLASTY  03/17/2024   Procedure: VENOUS ANGIOPLASTY;  Surgeon: Gretta Lonni PARAS, MD;  Location: [facility] PV [facility];  Service: Cardiovascular;;    Short Social History:  Social History   Tobacco Use   Smoking status: Never    Passive exposure: Past   Smokeless tobacco: Never  Substance Use Topics   Alcohol  use: No    Allergies  Allergen Reactions   Crestor [Rosuvastatin]     Other reaction(s): Muscle Pain   Fentanyl  Itching    Patient received Fentanyl  during procedure and had reaction, medicinal intervention applied.    Lipitor [Atorvastatin ]     Other reaction(s): Muscle Pain   Amoxicillin Itching    Did it involve swelling of the face/tongue/throat, SOB, or low BP? Unknown Did it involve sudden or severe rash/hives, skin peeling, or any reaction on the inside of your mouth or nose? Unknown Did you need to seek medical attention at a hospital or doctor's office? Unknown When did it last happen?      20 years ago If all above answers are NO,  may proceed with cephalosporin use.    Cefazolin  Other (See Comments) and Swelling    LIP SWELLING   Chlorhexidine  Hives    Patient has blistering   Hydromorphone  Itching    Patient states he may take with benadryl . Makes feet itch.   Oxycodone  Rash and Other (See Comments)   Percocet [Oxycodone -Acetaminophen ] Itching and Other (See Comments)    Legs only. Itching and burning feeling. Can tolerate with hydroxyzine     Morphine      Other reaction(s): rash/itching All narcotics per pt   Gabapentin Nausea And Vomiting    Stomach issues     Current Outpatient  Medications  Medication Sig Dispense Refill   aspirin  EC 81 MG tablet Take 1 tablet (81 mg total) by mouth daily. 90 tablet 3   Continuous Blood Gluc Sensor (FREESTYLE LIBRE 14 DAY SENSOR) MISC See admin instructions.     EPINEPHrine  (EPIPEN  2-PAK) 0.3 mg/0.3 mL IJ SOAJ injection Inject 0.3 mg into the muscle as needed for anaphylaxis.     hydrOXYzine  (VISTARIL ) 50 MG capsule Take 50 mg by mouth every 6 (six) hours as needed for itching.     multivitamin (RENA-VIT) TABS tablet Take 1 tablet by mouth daily with lunch.     nitroGLYCERIN  (NITROSTAT ) 0.4 MG SL tablet Place 0.4 mg under the tongue every 5 (five) minutes as needed for chest pain.     ondansetron  (ZOFRAN -ODT) 8 MG disintegrating tablet Take 8 mg by mouth 3 (three) times daily.     pantoprazole  (PROTONIX ) 40 MG tablet Take 40 mg by mouth daily.     prednisoLONE  acetate (PRED FORTE ) 1 % ophthalmic suspension Place 1 drop into the right eye daily.     sevelamer  carbonate (RENVELA ) 800 MG tablet Take 1,600-2,400 mg by mouth 3 (three) times daily with meals.     sodium zirconium cyclosilicate  (LOKELMA ) 10 g PACK packet Take 10 g by mouth every Tuesday, Thursday, Saturday, and Sunday.     traZODone  (DESYREL ) 150 MG tablet Take 150 mg by mouth at bedtime.     triamcinolone cream (KENALOG) 0.1 % Apply 1 Application topically 2 (two) times daily as needed (ITCHING).     ZYLET   0.5-0.3 % SUSP Place 1 drop into the right eye daily.     No current facility-administered medications for this visit.    REVIEW OF SYSTEMS  All other systems were reviewed and are negative     Objective:  Objective   Vitals:   06/10/24 1334  BP: 97/60  Pulse: 87  Resp: 18  Temp: 97.9 F (36.6 C)  TempSrc: Temporal  SpO2: 96%  Weight: 157 lb (71.2 kg)  Height: 5' 10 (1.778 m)   Body mass index is 22.53 kg/m.  Physical Exam General: no acute distress Cardiac: hemodynamically stable Vascular:   Multiphasic left radial signal and slightly augments with graft compression.  Strong palmar arch signal, monophasic ulnar signal.  There is also palpable thrill a fairly strong Doppler bruit in the graft.  Data: I reviewed the steal ultrasound. Slight augmentation in the radial artery from 19-23.  No flow was noted in the ulnar.  There is also a notes of some softly echogenic material in the proximal graft with velocities of 0.     Assessment/Plan:   [name] is a 58 y.o. male with ESRD who recently underwent left arm brachial axillary AV graft creation and left IJ TDC placement on 06/07/2024.  He was seen urgently in for steal evaluation.  Based on physical exam I do not believe he is having steal he has a strong Doppler signal at the radial and palmar arch.  I cannot quite explain the echogenic material in the proximal graft with velocities of 0 because he does have a fairly strong Doppler bruit overlying the graft and a palpable thrill.  I do believe that the graft is still open given these findings as well as a Doppler augmentation of the radial artery with compression. I believe his numbness and tingling should get better with time.  Encouraged to call back for follow-up evaluation should he have worsening of his symptoms or significant hand pain.  Norman GORMAN Serve MD [facility]

## 2024-06-14 ENCOUNTER — Ambulatory Visit: Admitting: Student

## 2024-06-15 NOTE — Progress Notes (Incomplete)
 ADVANCED HF CLINIC NOTE  Referring Physician: Shelda Atlas, MD Primary Care: Shelda Atlas, MD Primary Cardiologist: [name], MD  Chief Complaint:  HPI: Mr [name] is a 58 y.o. AAM with a history of HFrEF, ICM, HTN, HLD, DM, CAD, PAD, s/p left BKA transmetatarsal, ESRD 3 times a week. 2021 PCI to D2, PCI to mid LAD, and PCI to rPDA with drug eluting stents. 2022 in-stent stenosis s/p PCI to the LAD. Of note he has statin intolerance (rosuvastatin, atorvastatin , and zetia ).    Previously seen by Cardiology at [facility]. In 2023 EF 40% and normal RV.    Saw Dr Lawrence 8/7 or pre op clearance for upcoming surgeries. He reported chest pain and exertional dyspnea.  Recent echocardiogram shows severely reduced LVEF 30-35%  moderate to severe TR, mild to moderate MR, with stress test largely showing infarct without any significant ischemia. He was set up for cath  Admitted 8/25 with A/C HFrEF with low output. Initially with heart cath that showed severe multivessel disease with occluded prior stents in the LAD and LCx and severely elevated pressures. Significantly improved with iHD. RHC post several rounds of iHD and better BP control  with significantly improved pressures. GDMT limited with ESRD, tolerated Entresto  for BP control. VVS added TDC and L AV graft prior to discharge.   Today he returns for post hospital follow up. Overall feeling ***. Denies palpitations, CP, dizziness, edema, or PND/Orthopnea. *** SOB. Appetite ok. No fever or chills. Weight at home *** pounds. Taking all medications. Denies ETOH, tobacco or drug use.    Cardiac studies reviewed:  [facility] 05/31/24: Multivessel disease. RA 22, PA 109/56, PCWP 35, SVR 2500, Pa Sat 40%, CO 3, CI 1.7.  RHC 06/03/24 post dialysis RA 1, PA 43/12 (25), PCW 7, Fick CO/CI 4.6/2.4, TD CO/CI 4.7/2.4, PAPi 31. Pressures significantly improved.   Past Medical History:  Diagnosis Date   Acute osteomyelitis of right foot (HCC) 05/15/2019    Anemia 06/23/2019   Aortic stenosis    Bursitis    right shoulder   Complication of anesthesia    Coronary artery disease    Diabetes with renal manifestations(250.4) 08/23/2013   Diabetic infection of right foot (HCC)    Diabetic retinopathy associated with type 2 diabetes mellitus (HCC) 06/23/2019   ESRD on hemodialysis (HCC) 07/18/2011   Essential hypertension 10/18/2007   Qualifier: Diagnosis of  By: Kassie MD, Alyce A    History of blood transfusion    HYPERLIPIDEMIA 05/16/2008   Qualifier: Diagnosis of  By: Norleen MD, [name] ORN    MRSA INFECTION 10/18/2007   Qualifier: Diagnosis of  By: Wilhemina SHARALYN Aspen     Other forms of retinal detachment(361.89) 06/23/2019   PAD (peripheral artery disease) (HCC)    PONV (postoperative nausea and vomiting)    Prolonged QT interval    Pulmonary hypertension (HCC)    Secondary hyperparathyroidism (HCC) 06/23/2019   Sepsis (HCC) 05/14/2019    Current Outpatient Medications  Medication Sig Dispense Refill   aspirin  EC 81 MG tablet Take 1 tablet (81 mg total) by mouth daily. 90 tablet 3   Continuous Blood Gluc Sensor (FREESTYLE LIBRE 14 DAY SENSOR) MISC See admin instructions.     EPINEPHrine  (EPIPEN  2-PAK) 0.3 mg/0.3 mL IJ SOAJ injection Inject 0.3 mg into the muscle as needed for anaphylaxis.     hydrOXYzine  (VISTARIL ) 50 MG capsule Take 50 mg by mouth every 6 (six) hours as needed for itching.     multivitamin (RENA-VIT)  TABS tablet Take 1 tablet by mouth daily with lunch.     nitroGLYCERIN  (NITROSTAT ) 0.4 MG SL tablet Place 0.4 mg under the tongue every 5 (five) minutes as needed for chest pain.     ondansetron  (ZOFRAN -ODT) 8 MG disintegrating tablet Take 8 mg by mouth 3 (three) times daily.     pantoprazole  (PROTONIX ) 40 MG tablet Take 40 mg by mouth daily.     prednisoLONE  acetate (PRED FORTE ) 1 % ophthalmic suspension Place 1 drop into the right eye daily.     sevelamer  carbonate (RENVELA ) 800 MG tablet Take 1,600-2,400 mg by mouth 3  (three) times daily with meals.     sodium zirconium cyclosilicate  (LOKELMA ) 10 g PACK packet Take 10 g by mouth every Tuesday, Thursday, Saturday, and "Sunday.     traZODone (DESYREL) 150 MG tablet Take 150 mg by mouth at bedtime.     triamcinolone cream (KENALOG) 0.1 % Apply 1 Application topically 2 (two) times daily as needed (ITCHING).     ZYLET 0.5-0.3 % SUSP Place 1 drop into the right eye daily.     No current facility-administered medications for this visit.    Allergies  Allergen Reactions   Crestor [Rosuvastatin]     Other reaction(s): Muscle Pain   Fentanyl Itching    Patient received Fentanyl during procedure and had reaction, medicinal intervention applied.    Lipitor [Atorvastatin]     Other reaction(s): Muscle Pain   Amoxicillin Itching    Did it involve swelling of the face/tongue/throat, SOB, or low BP? Unknown Did it involve sudden or severe rash/hives, skin peeling, or any reaction on the inside of your mouth or nose? Unknown Did you need to seek medical attention at a hospital or doctor's office? Unknown When did it last happen?      20"  years ago If all above answers are NO, may proceed with cephalosporin use.    Cefazolin  Other (See Comments) and Swelling    LIP SWELLING   Chlorhexidine  Hives    Patient has blistering   Hydromorphone  Itching    Patient states he may take with benadryl . Makes feet itch.   Oxycodone  Rash and Other (See Comments)   Percocet [Oxycodone -Acetaminophen ] Itching and Other (See Comments)    Legs only. Itching and burning feeling. Can tolerate with hydroxyzine     Morphine      Other reaction(s): rash/itching All narcotics per pt   Gabapentin Nausea And Vomiting    Stomach issues       Social History   Socioeconomic History   Marital status: Married    Spouse name: Not on file   Number of children: 2   Years of education: 14   Highest education level: Not on file  Occupational History   Occupation: Disabled  Tobacco Use    Smoking status: Never    Passive exposure: Past   Smokeless tobacco: Never  Vaping Use   Vaping status: Never Used  Substance and Sexual Activity   Alcohol  use: No   Drug use: No   Sexual activity: Yes  Other Topics Concern   Not on file  Social History Narrative   Not on file   Social Drivers of Health   Financial Resource Strain: Not on file  Food Insecurity: No Food Insecurity (05/31/2024)   Hunger Vital Sign    Worried About Running Out of Food in the Last Year: Never true    Ran Out of Food in the Last Year: Never true  Transportation Needs: No Transportation  Needs (05/31/2024)   PRAPARE - Administrator, Civil Service (Medical): No    Lack of Transportation (Non-Medical): No  Physical Activity: Inactive (01/19/2024)   Exercise Vital Sign    Days of Exercise per Week: 0 days    Minutes of Exercise per Session: 0 min  Stress: Stress Concern Present (01/19/2024)   [employer] of Occupational Health - Occupational Stress Questionnaire    Feeling of Stress : Rather much  Social Connections: Not on file  Intimate Partner Violence: Not At Risk (05/31/2024)   Humiliation, Afraid, Rape, and Kick questionnaire    Fear of Current or Ex-Partner: No    Emotionally Abused: No    Physically Abused: No    Sexually Abused: No      Family History  Problem Relation Age of Onset   Diabetes Mother     There were no vitals filed for this visit.  PHYSICAL EXAM: General:  *** appearing.  No respiratory difficulty Neck: JVD *** cm.  Cor: Regular rate & rhythm. No murmurs. Lungs: clear Extremities: no edema  Neuro: alert & oriented x 3. Affect pleasant.   ECG:  ASSESSMENT & PLAN: Chronic HFrEF -Ischemic cardiomyopathy -Echo 05/17/24: LVEF 30-35%, RV okay, moderate to severe MAC with mild to moderate MR, severe TR, low flow low gradient moderate to severe AS with AVA 0.96 cm2 by VTI and mean gradient 10 mmHg, indexed AVA 0.51 cm2/m2 -Hx multivessel PCI. LHC  05/31/24 w/ multivessel CAD including occluded stents in LAD and LCX treated medically.  - RHC 05/31/24: RA 22, PA 109/56 (81), PCWP 35, Fick CI 1.6, TD CI 1.5, PVR 11, SVR > 2400. - RHC 06/03/24 with significantly improved pressures post dialysis. RA 1, PA 43/12 (25), PCW 7, Fick CO/CI 4.6/2.4, TD CO/CI 4.7/2.4, PAPi 31.  -No beta blocker with low-output -BP improving. Continue entresto  49/51 mg BID.    CAD -Hx prior PCIs. LHC 08/25 with multivessel CAD managed medically as above. No targets for PCI and poor CABG candidate. -Continue aspirin  81 mg daily -Intolerant to statins (rosuvastatin and atorvastatin ) as well as zetia    ESRD -volume managed with HD. -Nephrology following.  -Vascular placed new TDC and L AV graft 06/07/24   HTN -BP improving post iHD and increased Entresto  dose. *** -Meds as above   [facility]   [facility] [address] [facility] [place] [address] [phone] (office) [phone] (fax)

## 2024-06-16 ENCOUNTER — Encounter (HOSPITAL_COMMUNITY)

## 2024-06-16 ENCOUNTER — Telehealth (HOSPITAL_COMMUNITY): Payer: Self-pay

## 2024-06-16 NOTE — Telephone Encounter (Signed)
 Called to confirm/remind patient of their appointment at the [facility] on 06/17/24 2:30.   Appointment:   [x] Confirmed  [] Left mess   [] No answer/No voice mail  [] VM Full/unable to leave message  [] Phone not in service  Patient reminded to bring all medications and/or complete list.  Confirmed patient has transportation. Gave directions, instructed to utilize valet parking.

## 2024-06-17 ENCOUNTER — Ambulatory Visit (HOSPITAL_COMMUNITY)
Admission: RE | Admit: 2024-06-17 | Discharge: 2024-06-17 | Disposition: A | Discharge status: Home / Self Care | Source: Ambulatory Visit | Attending: Internal Medicine | Admitting: Internal Medicine

## 2024-06-17 ENCOUNTER — Encounter (HOSPITAL_COMMUNITY): Payer: Self-pay

## 2024-06-17 VITALS — BP 106/68 | HR 79 | Wt 165.4 lb

## 2024-06-17 DIAGNOSIS — I2510 Atherosclerotic heart disease of native coronary artery without angina pectoris: Secondary | ICD-10-CM | POA: Insufficient documentation

## 2024-06-17 DIAGNOSIS — I120 Hypertensive chronic kidney disease with stage 5 chronic kidney disease or end stage renal disease: Secondary | ICD-10-CM | POA: Diagnosis not present

## 2024-06-17 DIAGNOSIS — N186 End stage renal disease: Secondary | ICD-10-CM | POA: Insufficient documentation

## 2024-06-17 DIAGNOSIS — I255 Ischemic cardiomyopathy: Secondary | ICD-10-CM | POA: Diagnosis not present

## 2024-06-17 DIAGNOSIS — I082 Rheumatic disorders of both aortic and tricuspid valves: Secondary | ICD-10-CM | POA: Insufficient documentation

## 2024-06-17 DIAGNOSIS — Z79899 Other long term (current) drug therapy: Secondary | ICD-10-CM | POA: Insufficient documentation

## 2024-06-17 DIAGNOSIS — Z955 Presence of coronary angioplasty implant and graft: Secondary | ICD-10-CM | POA: Diagnosis not present

## 2024-06-17 DIAGNOSIS — E1151 Type 2 diabetes mellitus with diabetic peripheral angiopathy without gangrene: Secondary | ICD-10-CM | POA: Diagnosis not present

## 2024-06-17 DIAGNOSIS — I5022 Chronic systolic (congestive) heart failure: Secondary | ICD-10-CM | POA: Insufficient documentation

## 2024-06-17 DIAGNOSIS — E785 Hyperlipidemia, unspecified: Secondary | ICD-10-CM | POA: Insufficient documentation

## 2024-06-17 DIAGNOSIS — I10 Essential (primary) hypertension: Secondary | ICD-10-CM

## 2024-06-17 DIAGNOSIS — Z992 Dependence on renal dialysis: Secondary | ICD-10-CM | POA: Insufficient documentation

## 2024-06-17 DIAGNOSIS — E1122 Type 2 diabetes mellitus with diabetic chronic kidney disease: Secondary | ICD-10-CM | POA: Diagnosis not present

## 2024-06-17 DIAGNOSIS — Z89512 Acquired absence of left leg below knee: Secondary | ICD-10-CM | POA: Diagnosis not present

## 2024-06-17 DIAGNOSIS — Z7982 Long term (current) use of aspirin: Secondary | ICD-10-CM | POA: Insufficient documentation

## 2024-06-17 NOTE — Patient Instructions (Signed)
 Here has been no changes to your medications.  Your physician recommends that you schedule a follow-up appointment in: 4 months ( December) ** PLEASE CALL THE OFFICE IN OCTOBER TO ARRANGE YOUR FOLLOW UP APPOINTMENT.**  If you have any questions or concerns before your next appointment please send us  a message through [place] or call our office at [phone].    TO LEAVE A MESSAGE FOR THE NURSE SELECT OPTION 2, PLEASE LEAVE A MESSAGE INCLUDING: YOUR NAME DATE OF BIRTH CALL BACK NUMBER REASON FOR CALL**this is important as we prioritize the call backs  YOU WILL RECEIVE A CALL BACK THE SAME DAY AS LONG AS YOU CALL BEFORE 4:00 PM  At the [facility], you and your health needs are our priority. As part of our continuing mission to provide you with exceptional heart care, we have created designated Provider Care Teams. These Care Teams include your primary Cardiologist (physician) and Advanced Practice Providers (APPs- Physician Assistants and Nurse Practitioners) who all work together to provide you with the care you need, when you need it.   You may see any of the following providers on your designated Care Team at your next follow up: Dr Toribio Fuel Dr Ezra Shuck Dr. Ria Commander Dr. Morene Brownie Amy Lenetta, NP Caffie Shed, [place] [facility] [place], [place] Beckey Coe, NP [place] Lee, NP Ellouise Class, NP Tinnie Redman, PharmD Jaun Bash, PharmD   Please be sure to bring in all your medications bottles to every appointment.    Thank you for choosing [facility]

## 2024-06-21 ENCOUNTER — Ambulatory Visit: Attending: Cardiology | Admitting: Pharmacist

## 2024-06-21 ENCOUNTER — Other Ambulatory Visit (HOSPITAL_COMMUNITY): Payer: Self-pay

## 2024-06-21 ENCOUNTER — Telehealth: Payer: Self-pay

## 2024-06-21 DIAGNOSIS — E782 Mixed hyperlipidemia: Secondary | ICD-10-CM | POA: Diagnosis present

## 2024-06-21 DIAGNOSIS — E785 Hyperlipidemia, unspecified: Secondary | ICD-10-CM | POA: Insufficient documentation

## 2024-06-21 MED ORDER — EPINEPHRINE 0.3 MG/0.3ML IJ SOAJ
0.3000 mg | INTRAMUSCULAR | 0 refills | Status: AC | PRN
  Filled 2024-06-21: qty 2, 2d supply, fill #0

## 2024-06-21 NOTE — Patient Instructions (Signed)
 I will submit a prior authorization for Repatha. I will call you once I hear back. Please call me at [phone] with any questions.   Repatha is a cholesterol medication that improved your body's ability to get rid of bad cholesterol known as LDL. It can lower your LDL up to 60%! It is an injection that is given under the skin every 2 weeks. The medication often requires a prior authorization from your insurance company. We will take care of submitting all the necessary information to your insurance company to get it approved. The most common side effects of Repatha include runny nose, symptoms of the common cold, rarely flu or flu-like symptoms, back/muscle pain in about 3-4% of the patients, and redness, pain, or bruising at the injection site. Tell your healthcare provider if you have any side effect that bothers you or that does not go away.   Please repeat lab work in 3 months. Orders are in the system. Please come fasting.

## 2024-06-21 NOTE — Assessment & Plan Note (Signed)
 Assessment: LDL-C is not at goal of <55  Currently not on therapy- LDL-C 111 Intolerant to rosuvastatin, atorvastatin  (20 and 40mg ), ezetimibe  (myalgias) Previously on Repatha and tolerated well- was being prescribed by [facility] Reviewed injection technique, dosing  Plan: Submit PA for Repatha Send 90 DS to [facility] per pt request Repeat labs in 3 months

## 2024-06-21 NOTE — Progress Notes (Signed)
 Patient ID: [name]                 DOB: 10-31-1965                    MRN: [id number]      HPI: [name] is a 58 y.o. male patient referred to lipid clinic by Dr. Elmira. PMH is significant for HFrEF, ICM, HTN, HLD, DM, CAD, PAD, s/p left BKA transmetatarsal, ESRD 3 times a week. 2021 PCI to D2, PCI to mid LAD, and PCI to rPDA with drug eluting stents. 2022 in-stent stenosis s/p PCI to the LAD.   Patient presents today accompanied by his wife. He states he was previously on Repatha and tolerated well. Did not tolerate rosuvastatin or atorvastatin  (myalgias) or ezetimibe . Would like to resume Repatha (previously prescribed by cardiologist at [facility]). Would like 90 DS sent to [facility] once PA approved.   Current Medications: none Intolerances: rosuvastatin, atorvastatin  (20 and 40mg ), ezetimibe  (myalgias) Risk Factors: CAD, HTN, DM, PAD, in-stent stenosis LDL-C goal: <55 ApoB goal: <70  Diet: not discussed   Exercise: not discussed  Family History:  Family History  Problem Relation Age of Onset   Diabetes Mother     Social History: no tobacco, no ETOH  Labs: Lipid Panel     Component Value Date/Time   CHOL 205 (H) 04/21/2024 1051   TRIG 60 04/21/2024 1051   HDL 83 04/21/2024 1051   CHOLHDL 2.5 04/21/2024 1051   CHOLHDL 2.0 04/21/2023 0000   VLDL 70 (H) 09/12/2021 0454   LDLCALC 111 (H) 04/21/2024 1051   LDLCALC 58 04/21/2023 0000   LDLDIRECT 166.1 11/13/2008 1202   LABVLDL 11 04/21/2024 1051    Past Medical History:  Diagnosis Date   Acute osteomyelitis of right foot (HCC) 05/15/2019   Anemia 06/23/2019   Aortic stenosis    Bursitis    right shoulder   Complication of anesthesia    Coronary artery disease    Diabetes with renal manifestations(250.4) 08/23/2013   Diabetic infection of right foot (HCC)    Diabetic retinopathy associated with type 2 diabetes mellitus (HCC) 06/23/2019   ESRD on hemodialysis (HCC) 07/18/2011   Essential hypertension  10/18/2007   Qualifier: Diagnosis of  By: Kassie MD, Alyce A    History of blood transfusion    HYPERLIPIDEMIA 05/16/2008   Qualifier: Diagnosis of  By: Norleen MD, [name] ORN    MRSA INFECTION 10/18/2007   Qualifier: Diagnosis of  By: Wilhemina SHARALYN Aspen     Other forms of retinal detachment(361.89) 06/23/2019   PAD (peripheral artery disease) (HCC)    PONV (postoperative nausea and vomiting)    Prolonged QT interval    Pulmonary hypertension (HCC)    Secondary hyperparathyroidism (HCC) 06/23/2019   Sepsis (HCC) 05/14/2019    Current Outpatient Medications on File Prior to Visit  Medication Sig Dispense Refill   aspirin  EC 81 MG tablet Take 1 tablet (81 mg total) by mouth daily. 90 tablet 3   hydrOXYzine  (VISTARIL ) 50 MG capsule Take 50 mg by mouth every 6 (six) hours as needed for itching.     multivitamin (RENA-VIT) TABS tablet Take 1 tablet by mouth daily with lunch.     nitroGLYCERIN  (NITROSTAT ) 0.4 MG SL tablet Place 0.4 mg under the tongue every 5 (five) minutes as needed for chest pain.     ondansetron  (ZOFRAN -ODT) 8 MG disintegrating tablet Take 8 mg by mouth 3 (three) times daily.  pantoprazole  (PROTONIX ) 40 MG tablet Take 40 mg by mouth daily.     prednisoLONE  acetate (PRED FORTE ) 1 % ophthalmic suspension Place 1 drop into the right eye daily.     sevelamer  carbonate (RENVELA ) 800 MG tablet Take 1,600-2,400 mg by mouth 3 (three) times daily with meals.     sodium zirconium cyclosilicate  (LOKELMA ) 10 g PACK packet Take 10 g by mouth every Tuesday, Thursday, Saturday, and "Sunday.     traZODone (DESYREL) 150 MG tablet Take 150 mg by mouth at bedtime.     triamcinolone cream (KENALOG) 0.1 % Apply 1 Application topically 2 (two) times daily as needed (ITCHING).     ZYLET 0.5-0.3 % SUSP Place 1 drop into the right eye daily.     Continuous Blood Gluc Sensor (FREESTYLE LIBRE 14 DAY SENSOR) MISC See admin instructions.     [DISCONTINUED] loratadine (CLARITIN) 10 MG tablet Take 10 mg by  mouth daily as needed for allergies.     No current facility-administered medications on file prior to visit.    Allergies  Allergen Reactions   Crestor [Rosuvastatin]     Other reaction(s): Muscle Pain   Fentanyl Itching    Patient received Fentanyl during procedure and had reaction, medicinal intervention applied.    Lipitor [Atorvastatin]     Other reaction(s): Muscle Pain   Amoxicillin Itching    Did it involve swelling of the face/tongue/throat, SOB, or low BP? Unknown Did it involve sudden or severe rash/hives, skin peeling, or any reaction on the inside of your mouth or nose? Unknown Did you need to seek medical attention at a hospital or doctor's office? Unknown When did it last happen?      20"  years ago If all above answers are NO, may proceed with cephalosporin use.    Cefazolin  Other (See Comments) and Swelling    LIP SWELLING   Chlorhexidine  Hives    Patient has blistering   Hydromorphone  Itching    Patient states he may take with benadryl . Makes feet itch.   Oxycodone  Rash and Other (See Comments)   Percocet [Oxycodone -Acetaminophen ] Itching and Other (See Comments)    Legs only. Itching and burning feeling. Can tolerate with hydroxyzine     Morphine      Other reaction(s): rash/itching All narcotics per pt   Gabapentin Nausea And Vomiting    Stomach issues     Assessment/Plan:  1. Hyperlipidemia -  Mixed hyperlipidemia Assessment: LDL-C is not at goal of <55  Currently not on therapy- LDL-C 111 Intolerant to rosuvastatin, atorvastatin  (20 and 40mg ), ezetimibe  (myalgias) Previously on Repatha and tolerated well- was being prescribed by [facility] Reviewed injection technique, dosing  Plan: Submit PA for Repatha Send 90 DS to [facility] per pt request Repeat labs in 3 months    Thank you,  Nga Rabon D Raeanna Soberanes, Pharm.JONETTA SARAN, CPP [facility] [address] [place], [place] [address]  Phone: [phone]; Fax: [phone]

## 2024-06-21 NOTE — Telephone Encounter (Signed)
-----   Message from [name] sent at 06/21/2024 10:24 AM EDT ----- Please do PA for Reaptha

## 2024-06-21 NOTE — Telephone Encounter (Signed)
 Pharmacy Patient Advocate Encounter   Received notification from Physician's Office that prior authorization for REPATHA is required/requested.   Insurance verification completed.   The patient is insured through [facility] .   Per test claim: PA required; PA submitted to above mentioned insurance via Latent Key/confirmation #/EOC [id number] Status is pending

## 2024-06-24 ENCOUNTER — Telehealth: Payer: Self-pay | Admitting: Cardiology

## 2024-06-24 NOTE — Telephone Encounter (Signed)
 Called patient's wife (DPR) and canceled appointment for 06/30/24.

## 2024-06-24 NOTE — Telephone Encounter (Signed)
 Yes, okay with me.  Thanks MJP

## 2024-06-24 NOTE — Telephone Encounter (Signed)
 Pt's wife requesting a c/b regarding upcoming appt on 9/11. Please advise

## 2024-06-24 NOTE — Telephone Encounter (Signed)
 The pt was in hospital from 8/12-8/19-  Ellouise (dpr) reports that the f/u appt on 06/30/24- she has scheduling conflict. She is wondering if this appointment is even needed. She reports that the patient was seen in the [facility] last week 8/29 where they went over all of his medications, assessment, and EKG. He was also seen by the pharmacist here on 06/21/24. She is wondering if they can cancel this appt on 9/11- or maybe if it can be further out? Informed her that I would ask the provider and let her know. She verbalized understanding.

## 2024-06-27 ENCOUNTER — Encounter: Payer: Self-pay | Admitting: Vascular Surgery

## 2024-06-27 NOTE — Telephone Encounter (Signed)
 Looks like the plan is requesting additional information for approval (see chart media). Plan thinks this should be a continuation instead of new start, please advise.

## 2024-06-27 NOTE — Telephone Encounter (Signed)
 He was previously on Repatha - PA done by [facility]. It looks like he has Labs from 7/24 that were at goal. Maybe we can submit them.

## 2024-06-28 ENCOUNTER — Telehealth: Payer: Self-pay

## 2024-06-28 ENCOUNTER — Other Ambulatory Visit (HOSPITAL_COMMUNITY): Payer: Self-pay

## 2024-06-28 MED ORDER — REPATHA SURECLICK 140 MG/ML ~~LOC~~ SOAJ: 1.0000 mL | SUBCUTANEOUS | 3 refills | Status: AC

## 2024-06-28 NOTE — Telephone Encounter (Signed)
 Thanks! Faxed back requested information to plan. Status still pending.

## 2024-06-28 NOTE — Telephone Encounter (Signed)
 Rx approved. Pt made aware. He requested Rx be sent to [facility]

## 2024-06-28 NOTE — Addendum Note (Signed)
 Addended by: Adilynne Fitzwater D on: 06/28/2024 12:09 PM   Modules accepted: Orders

## 2024-06-28 NOTE — Telephone Encounter (Signed)
 Spoke to pt regarding his MyChart message. He is s/p LUE AVG on 06/07/24. He states his numbness/tingling and pain has been improving slowly but it is improving. He is interested in medical massage. I have advised him to discuss with APP at his wound check in 3 weeks, as he is still in the post op period. He is in agreement with this and will call us  if he has any further questions/concerns that arise.

## 2024-06-28 NOTE — Telephone Encounter (Signed)
 Pharmacy Patient Advocate Encounter  Received notification from [facility] that Prior Authorization for REPATHA  has been APPROVED from 06/22/24 to 06/22/25. Ran test claim, Copay is $0. This test claim was processed through [facility]- copay amounts may vary at other pharmacies due to pharmacy/plan contracts, or as the patient moves through the different stages of their insurance plan.

## 2024-06-30 ENCOUNTER — Ambulatory Visit: Admitting: Cardiology

## 2024-07-07 ENCOUNTER — Telehealth: Payer: Self-pay

## 2024-07-07 NOTE — Telephone Encounter (Signed)
 Office called stating that pt is having surgery for Hernia not rt hand. This needs to be corrected on clearance and sent back to their office. Please advise

## 2024-07-07 NOTE — Telephone Encounter (Signed)
 Per preop APP, patient is having hernia surgery.  Can you please correct in the chart? Thank you.

## 2024-07-07 NOTE — Telephone Encounter (Signed)
   Pre-operative Risk Assessment    Patient Name: [name]  DOB: 05-04-1966 MRN: [id number]   Date of last office visit: 05/26/24 NEWMAN LAWRENCE, MD Date of next office visit: 08/26/24 [facility], [facility]   Request for Surgical Clearance    Procedure:  HERNIA SURGERY  Date of Surgery:  Clearance TBD                                Surgeon:  CORDELLA IDLER, MD Surgeon's Group or Practice Name:  [facility] Phone number:  [phone] Fax number:  [phone]  ATTN: ROSELINE ARGYLE, CMA   Type of Clearance Requested:   - Medical  - Pharmacy:  Hold Aspirin      Type of Anesthesia:  General    Additional requests/questions:    Signed, Lucie DELENA Ku   07/07/2024, 10:23 AM

## 2024-07-07 NOTE — Telephone Encounter (Signed)
 Correction: Dictation error. Patient may proceed with hernia surgery.  Thanks MJP

## 2024-07-07 NOTE — Telephone Encounter (Signed)
 Ok to send to requesting office? Please advise.

## 2024-07-07 NOTE — Telephone Encounter (Signed)
   Patient Name: [name]  DOB: 1966-03-26 MRN: [id number]  Primary Cardiologist: [name], MD  Chart reviewed as part of pre-operative protocol coverage. Pre-op  clearance already addressed by colleagues in earlier phone notes. To summarize recommendations:  - Patient was hospitalized after his right and left heart catheterization on 05/31/2024 for optimization of his heart failure.  He after diuresis and GDMT, repeat right heart cath showed improved hemodynamics.  If clinically nothing is changed since hospital discharge, okay to proceed with right hand surgery.  Okay to hold aspirin     -Dr. Lawrence  Will route this bundled recommendation to requesting provider via Epic fax function and remove from pre-op  pool. Please call with questions.  [name] LOISE Fabry, PA-C 07/07/2024, 12:53 PM

## 2024-07-07 NOTE — Telephone Encounter (Signed)
 Patient was hospitalized after his right and left heart catheterization on 05/31/2024 for optimization of his heart failure.  He after diuresis and GDMT, repeat right heart cath showed improved hemodynamics.  If clinically nothing is changed since hospital discharge, okay to proceed with right hand surgery.  Okay to hold aspirin .

## 2024-07-12 ENCOUNTER — Ambulatory Visit: Payer: Self-pay | Admitting: General Surgery

## 2024-07-12 NOTE — Progress Notes (Signed)
 Sent message, via epic in basket, requesting orders in epic from Careers adviser.

## 2024-07-19 ENCOUNTER — Encounter (HOSPITAL_COMMUNITY)
Admission: RE | Admit: 2024-07-19 | Discharge: 2024-07-19 | Disposition: A | Discharge status: Home / Self Care | Source: Ambulatory Visit | Attending: Nurse Practitioner | Admitting: Nurse Practitioner

## 2024-07-19 ENCOUNTER — Other Ambulatory Visit: Payer: Self-pay

## 2024-07-19 ENCOUNTER — Encounter (HOSPITAL_COMMUNITY): Payer: Self-pay

## 2024-07-19 VITALS — BP 139/92 | HR 79 | Resp 16 | Ht 70.0 in | Wt 156.0 lb

## 2024-07-19 DIAGNOSIS — Z992 Dependence on renal dialysis: Secondary | ICD-10-CM | POA: Insufficient documentation

## 2024-07-19 DIAGNOSIS — Z955 Presence of coronary angioplasty implant and graft: Secondary | ICD-10-CM | POA: Insufficient documentation

## 2024-07-19 DIAGNOSIS — D45 Polycythemia vera: Secondary | ICD-10-CM | POA: Insufficient documentation

## 2024-07-19 DIAGNOSIS — E1129 Type 2 diabetes mellitus with other diabetic kidney complication: Secondary | ICD-10-CM

## 2024-07-19 DIAGNOSIS — I2510 Atherosclerotic heart disease of native coronary artery without angina pectoris: Secondary | ICD-10-CM | POA: Insufficient documentation

## 2024-07-19 DIAGNOSIS — K4020 Bilateral inguinal hernia, without obstruction or gangrene, not specified as recurrent: Secondary | ICD-10-CM | POA: Diagnosis not present

## 2024-07-19 DIAGNOSIS — N186 End stage renal disease: Secondary | ICD-10-CM | POA: Insufficient documentation

## 2024-07-19 DIAGNOSIS — Z01812 Encounter for preprocedural laboratory examination: Secondary | ICD-10-CM | POA: Diagnosis present

## 2024-07-19 DIAGNOSIS — I120 Hypertensive chronic kidney disease with stage 5 chronic kidney disease or end stage renal disease: Secondary | ICD-10-CM | POA: Insufficient documentation

## 2024-07-19 DIAGNOSIS — I272 Pulmonary hypertension, unspecified: Secondary | ICD-10-CM | POA: Insufficient documentation

## 2024-07-19 DIAGNOSIS — E1122 Type 2 diabetes mellitus with diabetic chronic kidney disease: Secondary | ICD-10-CM | POA: Insufficient documentation

## 2024-07-19 HISTORY — DX: Family history of other specified conditions: Z84.89

## 2024-07-19 HISTORY — DX: Gastro-esophageal reflux disease without esophagitis: K21.9

## 2024-07-19 HISTORY — DX: Unspecified osteoarthritis, unspecified site: M19.90

## 2024-07-19 LAB — CBC
HCT: 35.7 % — ABNORMAL LOW (ref 39.0–52.0)
Hemoglobin: 11.1 g/dL — ABNORMAL LOW (ref 13.0–17.0)
MCH: 27.7 pg (ref 26.0–34.0)
MCHC: 31.1 g/dL (ref 30.0–36.0)
MCV: 89 fL (ref 80.0–100.0)
Platelets: 123 K/uL — ABNORMAL LOW (ref 150–400)
RBC: 4.01 MIL/uL — ABNORMAL LOW (ref 4.22–5.81)
RDW: 15.7 % — ABNORMAL HIGH (ref 11.5–15.5)
WBC: 3.4 K/uL — ABNORMAL LOW (ref 4.0–10.5)
nRBC: 0 % (ref 0.0–0.2)

## 2024-07-19 LAB — BASIC METABOLIC PANEL WITH GFR
Anion gap: 20 — ABNORMAL HIGH (ref 5–15)
BUN: 52 mg/dL — ABNORMAL HIGH (ref 6–20)
CO2: 21 mmol/L — ABNORMAL LOW (ref 22–32)
Calcium: 9.7 mg/dL (ref 8.9–10.3)
Chloride: 95 mmol/L — ABNORMAL LOW (ref 98–111)
Creatinine, Ser: 6.89 mg/dL — ABNORMAL HIGH (ref 0.61–1.24)
GFR, Estimated: 9 mL/min — ABNORMAL LOW (ref >60)
Glucose, Bld: 283 mg/dL — ABNORMAL HIGH (ref 70–99)
Potassium: 4.4 mmol/L (ref 3.5–5.1)
Sodium: 135 mmol/L (ref 135–145)

## 2024-07-19 LAB — GLUCOSE, CAPILLARY: Glucose-Capillary: 286 mg/dL — ABNORMAL HIGH (ref 70–99)

## 2024-07-19 NOTE — Patient Instructions (Addendum)
 SURGICAL WAITING ROOM VISITATION  Patients having surgery or a procedure may have no more than 2 support people in the waiting area - these visitors may rotate.    Children under the age of 63 must have an adult with them who is not the patient.  Visitors with respiratory illnesses are discouraged from visiting and should remain at home.  If the patient needs to stay at the hospital during part of their recovery, the visitor guidelines for inpatient rooms apply. Pre-op  nurse will coordinate an appropriate time for 1 support person to accompany patient in pre-op .  This support person may not rotate.    Please refer to the [facility] website for the visitor guidelines for Inpatients (after your surgery is over and you are in a regular room).    Your procedure is scheduled on: 07/26/24   Report to [facility] Main Entrance    Report to admitting at 10:15 AM   Call this number if you have problems the morning of surgery [phone]   Do not eat food :After Midnight.   After Midnight you may have the following liquids until 9:30 AM DAY OF SURGERY  Water  Non-Citrus Juices (without pulp, NO RED-Apple, White grape, White cranberry) Black Coffee (NO MILK/CREAM OR CREAMERS, sugar ok)  Clear Tea (NO MILK/CREAM OR CREAMERS, sugar ok) regular and decaf                             Plain Jell-O (NO RED)                                           Fruit ices (not with fruit pulp, NO RED)                                     Popsicles (NO RED)                                                               Sports drinks like Gatorade (NO RED)          If you have questions, please contact your surgeon's office.   FOLLOW BOWEL PREP AND ANY ADDITIONAL PRE OP INSTRUCTIONS YOU RECEIVED FROM YOUR SURGEON'S OFFICE!!!     Oral Hygiene is also important to reduce your risk of infection.                                    Remember - BRUSH YOUR TEETH THE MORNING OF SURGERY WITH YOUR REGULAR  TOOTHPASTE  DENTURES WILL BE REMOVED PRIOR TO SURGERY PLEASE DO NOT APPLY Poly grip OR ADHESIVES!!!   Stop all vitamins and herbal supplements 7 days before surgery.   Take these medicines the morning of surgery with A SIP OF WATER : None   DO NOT TAKE ANY ORAL DIABETIC MEDICATIONS DAY OF YOUR SURGERY  How to Manage Your Diabetes Before and After Surgery  Why is it important to control my blood sugar before and after surgery? Improving  blood sugar levels before and after surgery helps healing and can limit problems. A way of improving blood sugar control is eating a healthy diet by:  Eating less sugar and carbohydrates  Increasing activity/exercise  Talking with your doctor about reaching your blood sugar goals High blood sugars (greater than 180 mg/dL) can raise your risk of infections and slow your recovery, so you will need to focus on controlling your diabetes during the weeks before surgery. Make sure that the doctor who takes care of your diabetes knows about your planned surgery including the date and location.  How do I manage my blood sugar before surgery? Check your blood sugar at least 4 times a day, starting 2 days before surgery, to make sure that the level is not too high or low. Check your blood sugar the morning of your surgery when you wake up and every 2 hours until you get to the Short Stay unit. If your blood sugar is less than 70 mg/dL, you will need to treat for low blood sugar: Do not take insulin . Treat a low blood sugar (less than 70 mg/dL) with  cup of clear juice (cranberry or apple), 4 glucose tablets, OR glucose gel. Recheck blood sugar in 15 minutes after treatment (to make sure it is greater than 70 mg/dL). If your blood sugar is not greater than 70 mg/dL on recheck, call [phone] for further instructions. Report your blood sugar to the short stay nurse when you get to Short Stay.  If you are admitted to the hospital after surgery: Your blood sugar  will be checked by the staff and you will probably be given insulin  after surgery (instead of oral diabetes medicines) to make sure you have good blood sugar levels. The goal for blood sugar control after surgery is 80-180 mg/dL.  Reviewed and Endorsed by [facility] Patient Education Committee, August 2015                              You may not have any metal on your body including jewelry, and body piercing             Do not wear lotions, powders, cologne, or deodorant              Men may shave face and neck.   Do not bring valuables to the hospital. [facility] IS NOT             RESPONSIBLE   FOR VALUABLES.   Contacts, glasses, dentures or bridgework may not be worn into surgery.  DO NOT BRING YOUR HOME MEDICATIONS TO THE HOSPITAL. PHARMACY WILL DISPENSE MEDICATIONS LISTED ON YOUR MEDICATION LIST TO YOU DURING YOUR ADMISSION IN THE HOSPITAL!    Patients discharged on the day of surgery will not be allowed to drive home.  Someone NEEDS to stay with you for the first 24 hours after anesthesia.              Please read over the following fact sheets you were given: IF YOU HAVE QUESTIONS ABOUT YOUR PRE-OP  INSTRUCTIONS PLEASE CALL [phone].   If you received a COVID test during your pre-op  visit  it is requested that you wear a mask when out in public, stay away from anyone that may not be feeling well and notify your surgeon if you develop symptoms. If you test positive for Covid or have been in contact with anyone that has tested positive  in the last 10 days please notify you surgeon.    [place] - Preparing for Surgery Before surgery, you can play an important role.  Because skin is not sterile, your skin needs to be as free of germs as possible.  You can reduce the number of germs on your skin by washing with CHG (chlorahexidine gluconate) soap before surgery.  CHG is an antiseptic cleaner which kills germs and bonds with the skin to continue killing germs even after  washing. Please DO NOT use if you have an allergy to CHG or antibacterial soaps.  If your skin becomes reddened/irritated stop using the CHG and inform your nurse when you arrive at Short Stay. Do not shave (including legs and underarms) for at least 48 hours prior to the first CHG shower.  You may shave your face/neck.  Please follow these instructions carefully:  1.  Shower with CHG Soap the night before surgery and the  morning of surgery.  2.  If you choose to wash your hair, wash your hair first as usual with your normal  shampoo.  3.  After you shampoo, rinse your hair and body thoroughly to remove the shampoo.                             4.  Use CHG as you would any other liquid soap.  You can apply chg directly to the skin and wash.  Gently with a scrungie or clean washcloth.  5.  Apply the CHG Soap to your body ONLY FROM THE NECK DOWN.   Do   not use on face/ open                           Wound or open sores. Avoid contact with eyes, ears mouth and   genitals (private parts).                       Wash face,  Genitals (private parts) with your normal soap.             6.  Wash thoroughly, paying special attention to the area where your    surgery  will be performed.  7.  Thoroughly rinse your body with warm water  from the neck down.  8.  DO NOT shower/wash with your normal soap after using and rinsing off the CHG Soap.                9.  Pat yourself dry with a clean towel.            10.  Wear clean pajamas.            11.  Place clean sheets on your bed the night of your first shower and do not  sleep with pets. Day of Surgery : Do not apply any lotions/deodorants the morning of surgery.  Please wear clean clothes to the hospital/surgery center.  FAILURE TO FOLLOW THESE INSTRUCTIONS MAY RESULT IN THE CANCELLATION OF YOUR SURGERY  PATIENT SIGNATURE_________________________________  NURSE  SIGNATURE__________________________________  ________________________________________________________________________

## 2024-07-19 NOTE — Progress Notes (Addendum)
 Patient states CHG gives him a rash. Instructed to use Dial  soap instead  COVID Vaccine Completed:  Date of COVID positive in last 90 days:  PCP - Aliene Colon, MD Cardiologist - Newman Lawrence, MD  Cardiac clearance by Lucie Ku 07/07/24 in Epic   Chest x-ray - N/A EKG - 06/17/24 Epic Stress Test - 05/17/24 Epic ECHO - 05/17/24 Epic Cardiac Cath - 06/03/24 Epic Pacemaker/ICD device last checked:N/A Spinal Cord Stimulator:N/A  Bowel Prep - N/A  Sleep Study - N/A CPAP -   Fasting Blood Sugar - 145-92 Checks Blood Sugar has CGM  Last dose of GLP1 agonist-  N/A GLP1 instructions:  Do not take after     Last dose of SGLT-2 inhibitors-  N/A SGLT-2 instructions:  Do not take after     Blood Thinner Instructions: N/A Last dose:   Time: Aspirin  Instructions: ASA 81, hold 5-7 days Last Dose:  Activity level: Can go up a flight of stairs and perform activities of daily living without stopping and without symptoms of chest pain or shortness of breath.   Anesthesia review: HTN, NSTEMI, dialysis MWF, PAD, pulmonary HTN, ACS, DM2, CAD, CHF, ESRD, patient needs benadryl  with surgery due to allergy to pain meds, BS 286 at PAT  Patient denies shortness of breath, fever, cough and chest pain at PAT appointment  Patient verbalized understanding of instructions that were given to them at the PAT appointment. Patient was also instructed that they will need to review over the PAT instructions again at home before surgery.

## 2024-07-20 NOTE — Anesthesia Preprocedure Evaluation (Addendum)
 Anesthesia Evaluation  Patient identified by MRN, date of birth, ID band Patient awake    Reviewed: Allergy & Precautions, H&P , NPO status , Patient's Chart, lab work & pertinent test results, reviewed documented beta blocker date and time   History of Anesthesia Complications (+) PONV and history of anesthetic complications  Airway Mallampati: III  TM Distance: >3 FB Neck ROM: Full    Dental no notable dental hx. (+) Teeth Intact, Dental Advisory Given   Pulmonary neg pulmonary ROS   Pulmonary exam normal breath sounds clear to auscultation       Cardiovascular hypertension, Pt. on medications and Pt. on home beta blockers pulmonary hypertension (mild pHTN on last cath 05/2024)+ CAD, + Past MI, + Peripheral Vascular Disease and +CHF (LVEF 30-35%)  Normal cardiovascular exam+ Valvular Problems/Murmurs (mod-severe AS) AS and MR  Rhythm:Regular Rate:Normal - Systolic murmurs Echo 05/17/24  1. Left ventricular ejection fraction, by estimation, is 30 to 35%. Left  ventricular ejection fraction by 3D volume is 30 %. The left ventricle has  moderately decreased function. The left ventricle demonstrates global  hypokinesis. Left ventricular  diastolic function could not be evaluated. The average left ventricular  global longitudinal strain is -11.2 %. The global longitudinal strain is  abnormal.   2. Right ventricular systolic function is normal. The right ventricular  size is mildly enlarged.   3. Left atrial size was moderately dilated.   4. A small pericardial effusion is present. The pericardial effusion is  circumferential.   5. The mitral valve is normal in structure. Mild to moderate mitral valve  regurgitation. No evidence of mitral stenosis. Moderate to severe mitral  annular calcification.   6. Tricuspid valve regurgitation is severe.   7. The aortic valve is calcified. There is moderate calcification of the  aortic valve.  There is moderate thickening of the aortic valve. Aortic  valve regurgitation is not visualized. Low flow low gradient moderate to  severe. Aortic valve area, by VTI  measures 0.96 cm. Aortic valve mean gradient measures 9.8 mmHg. Aortic  valve Vmax measures 2.14 m/s,SVI 22, DI 0.34, Indexed AVA 0.51 cm2/m2.   8. The inferior vena cava is normal in size with greater than 50%  respiratory variability, suggesting right atrial pressure of 3 mmHg.    R heart cath 06/03/24 Findings:   RA = 1 RV = 44/5 PA = 43/12 (25) PCW = 7 Fick cardiac output/index = 4.6/2.4 Thermo CO/CI = 4.7/2.4 PVR = 3.9 Ao sat = 95%  PA sat = 60%, 61% PAPi = 31   Assessment: 1. Much improved hemodynamics 2. Mild PAH with normal left-sided filling pressures and CO    Neuro/Psych negative neurological ROS  negative psych ROS   GI/Hepatic Neg liver ROS,GERD  ,,  Endo/Other  diabetesHypothyroidism    Renal/GU ESRF and DialysisRenal diseaseLast dialyzed yesterday.  negative genitourinary   Musculoskeletal negative musculoskeletal ROS (+)    Abdominal   Peds negative pediatric ROS (+)  Hematology  (+) Blood dyscrasia, anemia Hb 9/9, plt 129   Anesthesia Other Findings Past Medical History: 05/15/2019: Acute osteomyelitis of right foot (HCC) 06/23/2019: Anemia No date: Aortic stenosis No date: Arthritis No date: Bursitis     Comment:  right shoulder No date: Complication of anesthesia     Comment:  need benadryal with surgery d/t allergy to pain meds No date: Coronary artery disease 08/23/2013: Diabetes with renal manifestations(250.4) No date: Diabetic infection of right foot (HCC) 06/23/2019: Diabetic retinopathy associated with type  2 diabetes  mellitus (HCC) 07/18/2011: ESRD on hemodialysis (HCC) 10/18/2007: Essential hypertension     Comment:  Qualifier: Diagnosis of  By: Kassie MD, Alyce A  No date: Family history of adverse reaction to anesthesia     Comment:  sister get allegeric  reaction to all pain meds No date: GERD (gastroesophageal reflux disease) No date: Heart murmur No date: History of blood transfusion 05/16/2008: HYPERLIPIDEMIA     Comment:  Qualifier: Diagnosis of  By: Norleen MD, [name] ORN  10/18/2007: MRSA INFECTION     Comment:  Qualifier: Diagnosis of  By: Wilhemina KRAFT, Lucy   No date: Myocardial infarction (HCC) 06/23/2019: Other forms of retinal detachment(361.89) No date: PAD (peripheral artery disease) No date: PONV (postoperative nausea and vomiting) No date: Prolonged QT interval No date: Pulmonary hypertension (HCC) 06/23/2019: Secondary hyperparathyroidism 05/14/2019: Sepsis (HCC)   Reproductive/Obstetrics negative OB ROS                              Anesthesia Physical Anesthesia Plan  ASA: 4  Anesthesia Plan: General   Post-op Pain Management: Tylenol  PO (pre-op )*, Regional block*, Gabapentin PO (pre-op )* and Celebrex PO (pre-op )*   Induction: Intravenous  PONV Risk Score and Plan: 2 and Ondansetron , Dexamethasone , Midazolam , Diphenhydramine  and Treatment may vary due to age or medical condition  Airway Management Planned: Oral ETT  Additional Equipment: Arterial line  Intra-op Plan:   Post-operative Plan: Extubation in OR  Informed Consent: I have reviewed the patients History and Physical, chart, labs and discussed the procedure including the risks, benefits and alternatives for the proposed anesthesia with the patient or authorized representative who has indicated his/her understanding and acceptance.     Dental advisory given  Plan Discussed with: CRNA  Anesthesia Plan Comments: (Discussed risks of anesthesia with patient, including PONV, sore throat, lip/dental/eye damage. Rare risks discussed as well, such as cardiorespiratory and neurological sequelae, and allergic reactions. Discussed the role of CRNA in patient's perioperative care. Patient understands. Patient counseled on being higher risk  for anesthesia due to comorbidities: HFrEF, severe AS. Patient was told about increased risk of cardiac and respiratory events, including death.  Had in depth discussions about patients higher risk. Spoke with surgeon, who will do an open procedure rather than robotic procedure to avoid insufflation pressures, and only unilateral. 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   In regards to patient's cefazolin  allergy, he said his lips swelled from it, but he is unsure if the cefazolin  was the cause, and thinks it may have been stress related. He denied any mucus membrane involvement, or airway involvement. I discussed trying cefazolin  intra-op and he was OK with that plan. 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 He says he can take fentanyl  if he receives 50mg  of benadryl  as well. I advised him that he received fentanyl  at his last case, and he does not remember feeling itchy after it. We will proceed with fentanyl , and PRN benadryl .  I discussed with the patient the r/b/a of Transversus Abdominus Plane (TAP) blocks, including: - Bleeding - Infection - Damage to surrounding structures such as bowel, blood vessels, nerves - Allergic reaction to local anesthetic - Poor or nonfunctional block  Patient understands and agrees. )         Anesthesia Quick Evaluation

## 2024-07-20 NOTE — Progress Notes (Signed)
 Anesthesia Chart Review   Case: [id number] Date/Time: 07/26/24 1215   Procedure: REPAIR, HERNIA, INGUINAL, BILATERAL, ROBOT-ASSISTED (Bilateral) - GEN/TAP BLOCK ROBOTIC BILATERAL INGUINAL HERNIA REPAIR WITH MESH   Anesthesia type: General   Pre-op  diagnosis: BILATERAL INGUINAL HERNIAS   [facility] 02 / [facility]   Surgeons: Polly Cordella LABOR, MD       DISCUSSION:58 y.o. never smoker with h/o PONV, HTN, DM II (A1C 7.7), CAD (2021 PCI to D2, PCI to mid LAD, and PCI to rPDA with drug eluting stents. 2022 in-stent stenosis s/p PCI to the LAD), ICM LVEF 30-35%, pulmonary HTN mild on cath 05/2024, moderate to severe AS, ESRD on hemodialysis, bilateral inguinal hernias scheduled for above procedure 07/26/24 with Dr. Cordella Polly.   Dialysis scheduled MWF. S/p LUE AVG 06/07/24.   Pt reports itching with all narcotics, benadryl  given with procedure.   Per cardiology preoperative evaluation 07/07/24, Patient was hospitalized after his right and left heart catheterization on 05/31/2024 for optimization of his heart failure. He after diuresis and GDMT, repeat right heart cath showed improved hemodynamics. If clinically nothing is changed since hospital discharge, okay to proceed with right hand surgery. Okay to hold aspirin    Surgery corrected from right hand to inguinal hernia repair.   VS: BP (!) 139/92   Pulse 79   Resp 16   Ht 5' 10 (1.778 m)   Wt 70.8 kg   SpO2 100%   BMI 22.38 kg/m   PROVIDERS: Shelda Atlas, MD is PCP   Cardiologist - Newman Lawrence, MD  LABS: labs DOS (all labs ordered are listed, but only abnormal results are displayed)  Labs Reviewed  BASIC METABOLIC PANEL WITH GFR - Abnormal; Notable for the following components:      Result Value   Chloride 95 (*)    CO2 21 (*)    Glucose, Bld 283 (*)    BUN 52 (*)    Creatinine, Ser 6.89 (*)    GFR, Estimated 9 (*)    Anion gap 20 (*)    All other components within normal limits  CBC - Abnormal; Notable for  the following components:   WBC 3.4 (*)    RBC 4.01 (*)    Hemoglobin 11.1 (*)    HCT 35.7 (*)    RDW 15.7 (*)    Platelets 123 (*)    All other components within normal limits  GLUCOSE, CAPILLARY - Abnormal; Notable for the following components:   Glucose-Capillary 286 (*)    All other components within normal limits     IMAGES:   EKG:   CV: Echo 05/17/24 1. Left ventricular ejection fraction, by estimation, is 30 to 35%. Left  ventricular ejection fraction by 3D volume is 30 %. The left ventricle has  moderately decreased function. The left ventricle demonstrates global  hypokinesis. Left ventricular  diastolic function could not be evaluated. The average left ventricular  global longitudinal strain is -11.2 %. The global longitudinal strain is  abnormal.   2. Right ventricular systolic function is normal. The right ventricular  size is mildly enlarged.   3. Left atrial size was moderately dilated.   4. A small pericardial effusion is present. The pericardial effusion is  circumferential.   5. The mitral valve is normal in structure. Mild to moderate mitral valve  regurgitation. No evidence of mitral stenosis. Moderate to severe mitral  annular calcification.   6. Tricuspid valve regurgitation is severe.   7. The aortic valve is calcified. There  is moderate calcification of the  aortic valve. There is moderate thickening of the aortic valve. Aortic  valve regurgitation is not visualized. Low flow low gradient moderate to  severe. Aortic valve area, by VTI  measures 0.96 cm. Aortic valve mean gradient measures 9.8 mmHg. Aortic  valve Vmax measures 2.14 m/s,SVI 22, DI 0.34, Indexed AVA 0.51 cm2/m2.   8. The inferior vena cava is normal in size with greater than 50%  respiratory variability, suggesting right atrial pressure of 3 mmHg.   Cardiac Cath 06/03/24 Assessment: 1. Much improved hemodynamics 2. Mild PAH with normal left-sided filling pressures and CO   Past  Medical History:  Diagnosis Date   Acute osteomyelitis of right foot (HCC) 05/15/2019   Anemia 06/23/2019   Aortic stenosis    Arthritis    Bursitis    right shoulder   Complication of anesthesia    need benadryal with surgery d/t allergy to pain meds   Coronary artery disease    Diabetes with renal manifestations(250.4) 08/23/2013   Diabetic infection of right foot (HCC)    Diabetic retinopathy associated with type 2 diabetes mellitus (HCC) 06/23/2019   ESRD on hemodialysis (HCC) 07/18/2011   Essential hypertension 10/18/2007   Qualifier: Diagnosis of  By: Kassie MD, Alyce LABOR    Family history of adverse reaction to anesthesia    sister get allegeric reaction to all pain meds   GERD (gastroesophageal reflux disease)    Heart murmur    History of blood transfusion    HYPERLIPIDEMIA 05/16/2008   Qualifier: Diagnosis of  By: Norleen MD, [name] ORN    MRSA INFECTION 10/18/2007   Qualifier: Diagnosis of  By: Wilhemina RMA, Lucy     Myocardial infarction [facility])    Other forms of retinal detachment(361.89) 06/23/2019   PAD (peripheral artery disease)    PONV (postoperative nausea and vomiting)    Prolonged QT interval    Pulmonary hypertension (HCC)    Secondary hyperparathyroidism 06/23/2019   Sepsis (HCC) 05/14/2019    Past Surgical History:  Procedure Laterality Date   A/V FISTULAGRAM N/A 12/29/2023   Procedure: A/V Fistulagram;  Surgeon: Norine Manuelita LABOR, MD;  Location: [facility] INVASIVE CV LAB;  Service: Cardiovascular;  Laterality: N/A;   A/V SHUNT INTERVENTION N/A 03/17/2024   Procedure: A/V SHUNT INTERVENTION;  Surgeon: Gretta Lonni PARAS, MD;  Location: [facility] PV LAB;  Service: Cardiovascular;  Laterality: N/A;  cephalic arch   ABDOMINAL AORTOGRAM N/A 05/17/2019   Procedure: ABDOMINAL AORTOGRAM;  Surgeon: Harvey Carlin BRAVO, MD;  Location: [facility] INVASIVE CV LAB;  Service: Cardiovascular;  Laterality: N/A;   AMPUTATION TOE Right 05/14/2019   Procedure: PARTIAL AMPUTATION SECOND TOE RIGHT FOOT;   Surgeon: Gretel Ozell PARAS, DPM;  Location: [facility] OR;  Service: Podiatry;  Laterality: Right;   AMPUTATION TOE Right 05/23/2019   Procedure: AMPUTATION OF SECOND TOE METATARSAL PHALANGEAL JOINT;  Surgeon: Gretel Ozell PARAS, DPM;  Location: [facility];  Service: Podiatry;  Laterality: Right;   AMPUTATION TOE Right 05/27/2019   Procedure: Amputation Toe;  Surgeon: Gretel Ozell PARAS, DPM;  [facility];  Service: Podiatry;  Laterality: Right;  right third toe   APPLICATION OF WOUND VAC Right 05/27/2019   Procedure: Application Of Wound Vac;  Surgeon: Gretel Ozell PARAS, DPM;  [facility];  Service: Podiatry;  Laterality: Right;   AV FISTULA PLACEMENT  06-18-11   Right brachiocephalic AVF   BONE BIOPSY Right 05/14/2019   Procedure: OPEN SUPERFICIAL BONE BIOPSY GREAT TOE;  Surgeon: Gretel,  Ozell PARAS, DPM;  [facility];  Service: Podiatry;  Laterality: Right;   CARDIAC CATHETERIZATION     CORONARY BALLOON ANGIOPLASTY N/A 09/24/2020   Procedure: CORONARY BALLOON ANGIOPLASTY;  Surgeon: Swaziland, Peter M, MD;  Location: [facility] INVASIVE CV LAB;  Service: Cardiovascular;  Laterality: N/A;   CORONARY PRESSURE/FFR STUDY N/A 09/24/2020   Procedure: INTRAVASCULAR PRESSURE WIRE/FFR STUDY;  Surgeon: Swaziland, Peter M, MD;  Location: [facility] INVASIVE CV LAB;  Service: Cardiovascular;  Laterality: N/A;   CORONARY STENT INTERVENTION N/A 09/24/2020   Procedure: CORONARY STENT INTERVENTION;  Surgeon: Swaziland, Peter M, MD;  Location: [facility] INVASIVE CV LAB;  Service: Cardiovascular;  Laterality: N/A;   EYE SURGERY     left eye for Laser, diabetic retinopathy   FISTULOGRAM Right 11/06/2011   Procedure: FISTULOGRAM;  Surgeon: Redell LITTIE Door, MD;  Location: [facility] CATH LAB;  Service: Cardiovascular;  Laterality: Right;   HEMATOMA EVACUATION Right Feb. 25, 2014   HEMATOMA EVACUATION Right 12/14/2012   Procedure: EVACUATION HEMATOMA;  Surgeon: Redell LITTIE Door, MD;  Location: [facility] OR;  Service: Vascular;  Laterality: Right;   INCISION AND DRAINAGE Right 05/23/2019    Procedure: INCISION AND DRAINAGE OF RIGHT FOOT DEEP SPACE ABSCESS;  Surgeon: Gretel Ozell PARAS, DPM;  [facility];  Service: Podiatry;  Laterality: Right;   INJECTION OF SILICONE OIL Left 01/09/2023   Procedure: INJECTION OF SILICONE OIL;  Surgeon: Elner Arley LABOR, MD;  Location: [facility] OR;  Service: Ophthalmology;  Laterality: Left;   INSERTION OF ARTERIOVENOUS (AV) ARTEGRAFT ARM Left 06/07/2024   Procedure: INSERTION LEFT UPPER ARM ARTERIOVENOUS GRAFT;  Surgeon: Pearline Norman RAMAN, MD;  Location: [facility] OR;  Service: Vascular;  Laterality: Left;  LEFT UPPER EXTREMITY   INSERTION OF DIALYSIS CATHETER  12/14/2012   Procedure: INSERTION OF DIALYSIS CATHETER;  Surgeon: Redell LITTIE Door, MD;  [facility];  Service: Vascular;;   INSERTION OF DIALYSIS CATHETER Right 01/07/2021   Procedure: INSERTION OF DIALYSIS CATHETER;  Surgeon: Harvey Carlin BRAVO, MD;  Location: [facility] OR;  Service: Vascular;  Laterality: Right;   INSERTION OF DIALYSIS CATHETER Left 01/08/2021   Procedure: INSERTION OF LEFT INTERNAL JUGULAR DIALYSIS CATHETER, tunneled;  Surgeon: Eliza Lonni RAMAN, MD;  Location: [facility] OR;  Service: Vascular;  Laterality: Left;   INSERTION OF DIALYSIS CATHETER Left 06/07/2024   Procedure: INSERTION OF DIALYSIS CATHETER;  Surgeon: Pearline Norman RAMAN, MD;  Location: [facility] OR;  Service: Vascular;  Laterality: Left;  TUNNELED DIALYSIS   IRRIGATION AND DEBRIDEMENT FOOT Right 05/25/2019   Procedure: INCISION AND DRAINAGE FOOT;  Surgeon: Gretel Ozell PARAS, DPM;  [facility];  Service: Podiatry;  Laterality: Right;   IRRIGATION AND DEBRIDEMENT FOOT Right 05/27/2019   Procedure: IRRIGATION AND DEBRIDEMENT FOOT;  Surgeon: Gretel Ozell PARAS, DPM;  [facility];  Service: Podiatry;  Laterality: Right;   LASER PHOTO ABLATION Left 01/09/2023   Procedure: LASER PHOTO ABLATION;  Surgeon: Elner Arley LABOR, MD;  Location: [facility] OR;  Service: Ophthalmology;  Laterality: Left;   LEFT HEART CATH AND CORONARY ANGIOGRAPHY N/A 09/24/2020    Procedure: LEFT HEART CATH AND CORONARY ANGIOGRAPHY;  Surgeon: Swaziland, Peter M, MD;  Location: [facility] INVASIVE CV LAB;  Service: Cardiovascular;  Laterality: N/A;   LOWER EXTREMITY ANGIOGRAPHY Bilateral 05/17/2019   Procedure: LOWER EXTREMITY ANGIOGRAPHY;  Surgeon: Harvey Carlin BRAVO, MD;  [facility];  Service: Cardiovascular;  Laterality: Bilateral;   PARS PLANA VITRECTOMY Left 01/09/2023   Procedure: PARS PLANA VITRECTOMY WITH 25 GAUGE AND REPAIR  OF RETINAL DETACHMENT;  Surgeon: Elner Arley LABOR, MD;  Location: [facility] OR;  Service: Ophthalmology;  Laterality: Left;   REMOVAL OF A DIALYSIS CATHETER Right 01/08/2021   Procedure: REMOVAL OF TEMPORARY DIALYSIS CATHETER, LEFT FEMORAL VEIN;  Surgeon: Eliza Lonni RAMAN, MD;  Location: [facility] OR;  Service: Vascular;  Laterality: Right;   REVISON OF ARTERIOVENOUS FISTULA Right 12/14/2012   Procedure: REVISON OF upper arm ARTERIOVENOUS FISTULA using 61mmx10cm gortex graft;  Surgeon: Redell LITTIE Door, MD;  Location: [facility] OR;  Service: Vascular;  Laterality: Right;   REVISON OF ARTERIOVENOUS FISTULA Right 12/27/2020   Procedure: RIGHT UPPER EXTREMITY ARTERIOVENOUS FISTULA REVISiON;  Surgeon: Magda Debby SAILOR, MD;  Location[facility] OR;  Service: Vascular;  Laterality: Right;  PERIPHERAL NERVE BLOCK   RIGHT HEART CATH N/A 06/03/2024   Procedure: RIGHT HEART CATH;  Surgeon: Cherrie Toribio SAUNDERS, MD;  [facility];  Service: Cardiovascular;  Laterality: N/A;   RIGHT/LEFT HEART CATH AND CORONARY ANGIOGRAPHY N/A 08/07/2020   Procedure: RIGHT/LEFT HEART CATH AND CORONARY ANGIOGRAPHY;  Surgeon: Swaziland, Peter M, MD;  Location: [facility] INVASIVE CV LAB;  Service: Cardiovascular;  Laterality: N/A;   RIGHT/LEFT HEART CATH AND CORONARY ANGIOGRAPHY N/A 05/31/2024   Procedure: RIGHT/LEFT HEART CATH AND CORONARY ANGIOGRAPHY;  Surgeon: Elmira Newman PARAS, MD;  [facility];  Service: Cardiovascular;  Laterality: N/A;   SOFT TISSUE MASS EXCISION     Right  arm, Left leg  for MRSA infection   ULTRASOUND GUIDANCE FOR VASCULAR ACCESS Left 06/07/2024   Procedure: ULTRASOUND GUIDANCE, FOR VASCULAR ACCESS;  Surgeon: Pearline Norman RAMAN, MD;  Location: [facility] OR;  Service: Vascular;  Laterality: Left;   VENOUS ANGIOPLASTY  12/29/2023   Procedure: VENOUS ANGIOPLASTY;  Surgeon: Norine Manuelita LABOR, MD;  [facility];  Service: Cardiovascular;;  Cephalic Arch; Intragraft; Distal Edge of Stent   VENOUS ANGIOPLASTY  03/17/2024   Procedure: VENOUS ANGIOPLASTY;  Surgeon: Gretta Lonni PARAS, MD;  Location: [facility] PV LAB;  Service: Cardiovascular;;    MEDICATIONS:  traZODone  (DESYREL ) 150 MG tablet   aspirin  EC 81 MG tablet   Continuous Blood Gluc Sensor (FREESTYLE LIBRE 14 DAY SENSOR) MISC   EPINEPHrine  (EPIPEN  2-PAK) 0.3 mg/0.3 mL IJ SOAJ injection   Evolocumab  (REPATHA  SURECLICK) 140 MG/ML SOAJ   multivitamin (RENA-VIT) TABS tablet   nitroGLYCERIN  (NITROSTAT ) 0.4 MG SL tablet   prednisoLONE  acetate (PRED FORTE ) 1 % ophthalmic suspension   sevelamer  carbonate (RENVELA ) 800 MG tablet   sodium zirconium cyclosilicate  (LOKELMA ) 10 g PACK packet   No current facility-administered medications for this encounter.    Harlene Hoots Ward, PA-C [facility] Pre-Surgical Testing [phone]

## 2024-07-20 NOTE — Progress Notes (Unsigned)
 POST OPERATIVE OFFICE NOTE    CC:  F/u for surgery  HPI:  This is a 58 y.o. male who is s/p LUA AVG and TDC on 06/07/2024 by Dr. Pearline.     Patient has a right brachiocephalic placed in 2012 by Dr. Laurence.  This has undergone revision with previous interposition.  He had multiple fistulogram's as recently as 03/17/2024 with a stenosis adjacent to the stent in the cephalic arch.   He was seen on 06/10/2024 for concerns for steal.  Dr. Pearline did not feel that his sx were due to steal as he had a strong doppler signal at the radial and palmar arch.    Pt states he does have pain/numbness in the left hand.    He states it was just around the pinky finger and ring finger but now he has it around the thumb and first finger.  He states that the middle finger is not really affected.  He uses vibration on the palm and it seems to help.  He has rubbed the pinky finger due to the numbness and now has a tiny ulceration present.  This has not gotten any worse but is not getting any better.  He does not have any clumsiness with the left hand.  He is right hand dominant.  He states that this may be a little better than when he last saw Dr. Pearline.   The pt is on dialysis M/W/F at [facility] location.   Allergies  Allergen Reactions   Crestor [Rosuvastatin]     Other reaction(s): Muscle Pain   Fentanyl  Itching    Patient received Fentanyl  during procedure and had reaction, medicinal intervention applied.    Lipitor [Atorvastatin ]     Other reaction(s): Muscle Pain   Amoxicillin Itching    Did it involve swelling of the face/tongue/throat, SOB, or low BP? Unknown Did it involve sudden or severe rash/hives, skin peeling, or any reaction on the inside of your mouth or nose? Unknown Did you need to seek medical attention at a hospital or doctor's office? Unknown When did it last happen?      20 years ago If all above answers are NO, may proceed with cephalosporin use.    Cefazolin  Other (See  Comments) and Swelling    LIP SWELLING   Chlorhexidine  Hives    Patient has blistering   Hydromorphone  Itching    Patient states he may take with benadryl . Makes feet itch.   Oxycodone  Rash and Other (See Comments)   Percocet [Oxycodone -Acetaminophen ] Itching and Other (See Comments)    Legs only. Itching and burning feeling. Can tolerate with hydroxyzine     Morphine      Other reaction(s): rash/itching All narcotics per pt   Gabapentin Nausea And Vomiting    Stomach issues     Current Outpatient Medications  Medication Sig Dispense Refill   aspirin  EC 81 MG tablet Take 1 tablet (81 mg total) by mouth daily. 90 tablet 3   Continuous Blood Gluc Sensor (FREESTYLE LIBRE 14 DAY SENSOR) MISC See admin instructions.     EPINEPHrine  (EPIPEN  2-PAK) 0.3 mg/0.3 mL IJ SOAJ injection Inject 0.3 mg into the muscle as needed for anaphylaxis. 2 each 0   Evolocumab  (REPATHA  SURECLICK) 140 MG/ML SOAJ Inject 140 mg into the skin every 14 (fourteen) days. 6 mL 3   multivitamin (RENA-VIT) TABS tablet Take 1 tablet by mouth daily with lunch.     nitroGLYCERIN  (NITROSTAT ) 0.4 MG SL tablet Place 0.4 mg under the tongue  every 5 (five) minutes as needed for chest pain.     prednisoLONE  acetate (PRED FORTE ) 1 % ophthalmic suspension Place 1 drop into the right eye daily. (Patient not taking: Reported on 07/14/2024)     sevelamer  carbonate (RENVELA ) 800 MG tablet Take 1,600-2,400 mg by mouth 3 (three) times daily with meals.     sodium zirconium cyclosilicate  (LOKELMA ) 10 g PACK packet Take 10 g by mouth every Tuesday, Thursday, Saturday, and Sunday.     traZODone  (DESYREL ) 150 MG tablet Take 150 mg by mouth at bedtime as needed for sleep.     No current facility-administered medications for this visit.     ROS:  See HPI  Physical Exam:  Today's Vitals   07/22/24 0956  BP: (!) 144/80  Pulse: 80  Temp: 97.9 F (36.6 C)  SpO2: 99%  Weight: 158 lb 4.6 oz (71.8 kg)   Body mass index is 22.71  kg/m.   Incision:  well healed Extremities:   He does have radial and doppler flow that augments with compression of the graft.  He has a faint palmar arch signal that slightly augments with compression of the graft.  He has strong hand grip and motor in tact.   Access is  easily palpable and has an excellent thrill.     Assessment/Plan:  This is a 58 y.o. male who is s/p: LUA AVG and TDC on 06/07/2024 by Dr. Pearline.    -pt continues to have pain and numbness in the left hand.  It is still significant but maybe slightly better.  His motor is in tact.  Discussed possibility of needing to ligate the graft.  After much discussion, pt would like to wait and see if his hand continues to improve.  He is having hernia surgery on Tuesday.  Discussed with Dr. Pearline and will set him up for LUE angiogram via the groin in 3-4 weeks after he has recovered from his hernia surgery.   -discussed with him that if the tiny ulcer on his pinky finger worsens or his symptoms worsen, he will call us  sooner.  He and his wife expressed good understanding.  -will refer him for occupational therapy for his left hand symptoms.    Lucie Apt, [facility] [phone]  Clinic MD:  Pearline

## 2024-07-22 ENCOUNTER — Ambulatory Visit: Attending: Vascular Surgery | Admitting: Physician Assistant

## 2024-07-22 VITALS — BP 144/80 | HR 80 | Temp 97.9°F | Wt 158.3 lb

## 2024-07-22 DIAGNOSIS — Z992 Dependence on renal dialysis: Secondary | ICD-10-CM

## 2024-07-22 DIAGNOSIS — M79642 Pain in left hand: Secondary | ICD-10-CM

## 2024-07-22 DIAGNOSIS — N186 End stage renal disease: Secondary | ICD-10-CM

## 2024-07-25 ENCOUNTER — Other Ambulatory Visit: Payer: Self-pay

## 2024-07-25 DIAGNOSIS — N186 End stage renal disease: Secondary | ICD-10-CM

## 2024-07-26 ENCOUNTER — Ambulatory Visit (HOSPITAL_COMMUNITY): Payer: Self-pay | Admitting: Medical

## 2024-07-26 ENCOUNTER — Encounter (HOSPITAL_COMMUNITY)
Admission: RE | Disposition: A | Discharge status: Home / Self Care | Payer: Self-pay | Source: Home / Self Care | Attending: General Surgery

## 2024-07-26 ENCOUNTER — Other Ambulatory Visit: Payer: Self-pay

## 2024-07-26 ENCOUNTER — Encounter (HOSPITAL_COMMUNITY): Payer: Self-pay | Admitting: General Surgery

## 2024-07-26 ENCOUNTER — Ambulatory Visit (HOSPITAL_COMMUNITY)
Admission: RE | Admit: 2024-07-26 | Discharge: 2024-07-26 | Disposition: A | Discharge status: Home / Self Care | Attending: General Surgery | Admitting: General Surgery

## 2024-07-26 ENCOUNTER — Ambulatory Visit (HOSPITAL_BASED_OUTPATIENT_CLINIC_OR_DEPARTMENT_OTHER): Payer: Self-pay | Admitting: Certified Registered"

## 2024-07-26 DIAGNOSIS — I509 Heart failure, unspecified: Secondary | ICD-10-CM | POA: Diagnosis not present

## 2024-07-26 DIAGNOSIS — D45 Polycythemia vera: Secondary | ICD-10-CM | POA: Diagnosis not present

## 2024-07-26 DIAGNOSIS — E039 Hypothyroidism, unspecified: Secondary | ICD-10-CM | POA: Diagnosis not present

## 2024-07-26 DIAGNOSIS — Z7982 Long term (current) use of aspirin: Secondary | ICD-10-CM | POA: Diagnosis not present

## 2024-07-26 DIAGNOSIS — K4090 Unilateral inguinal hernia, without obstruction or gangrene, not specified as recurrent: Secondary | ICD-10-CM | POA: Diagnosis present

## 2024-07-26 DIAGNOSIS — Z992 Dependence on renal dialysis: Secondary | ICD-10-CM | POA: Diagnosis not present

## 2024-07-26 DIAGNOSIS — E1122 Type 2 diabetes mellitus with diabetic chronic kidney disease: Secondary | ICD-10-CM | POA: Insufficient documentation

## 2024-07-26 DIAGNOSIS — I2510 Atherosclerotic heart disease of native coronary artery without angina pectoris: Secondary | ICD-10-CM | POA: Diagnosis not present

## 2024-07-26 DIAGNOSIS — Z955 Presence of coronary angioplasty implant and graft: Secondary | ICD-10-CM | POA: Insufficient documentation

## 2024-07-26 DIAGNOSIS — D631 Anemia in chronic kidney disease: Secondary | ICD-10-CM | POA: Diagnosis not present

## 2024-07-26 DIAGNOSIS — D176 Benign lipomatous neoplasm of spermatic cord: Secondary | ICD-10-CM | POA: Diagnosis not present

## 2024-07-26 DIAGNOSIS — E1129 Type 2 diabetes mellitus with other diabetic kidney complication: Secondary | ICD-10-CM

## 2024-07-26 DIAGNOSIS — I120 Hypertensive chronic kidney disease with stage 5 chronic kidney disease or end stage renal disease: Secondary | ICD-10-CM | POA: Diagnosis not present

## 2024-07-26 DIAGNOSIS — I132 Hypertensive heart and chronic kidney disease with heart failure and with stage 5 chronic kidney disease, or end stage renal disease: Secondary | ICD-10-CM | POA: Insufficient documentation

## 2024-07-26 DIAGNOSIS — K4020 Bilateral inguinal hernia, without obstruction or gangrene, not specified as recurrent: Secondary | ICD-10-CM | POA: Diagnosis not present

## 2024-07-26 DIAGNOSIS — Z833 Family history of diabetes mellitus: Secondary | ICD-10-CM | POA: Diagnosis not present

## 2024-07-26 DIAGNOSIS — N186 End stage renal disease: Secondary | ICD-10-CM

## 2024-07-26 DIAGNOSIS — E785 Hyperlipidemia, unspecified: Secondary | ICD-10-CM | POA: Insufficient documentation

## 2024-07-26 DIAGNOSIS — E1151 Type 2 diabetes mellitus with diabetic peripheral angiopathy without gangrene: Secondary | ICD-10-CM | POA: Diagnosis not present

## 2024-07-26 DIAGNOSIS — K219 Gastro-esophageal reflux disease without esophagitis: Secondary | ICD-10-CM | POA: Insufficient documentation

## 2024-07-26 DIAGNOSIS — Z79899 Other long term (current) drug therapy: Secondary | ICD-10-CM | POA: Diagnosis not present

## 2024-07-26 HISTORY — PX: INGUINAL HERNIA REPAIR: SHX194

## 2024-07-26 LAB — COMPREHENSIVE METABOLIC PANEL WITH GFR
ALT: 15 U/L (ref 0–44)
AST: 27 U/L (ref 15–41)
Albumin: 4.3 g/dL (ref 3.5–5.0)
Alkaline Phosphatase: 152 U/L — ABNORMAL HIGH (ref 38–126)
Anion gap: 22 — ABNORMAL HIGH (ref 5–15)
BUN: 46 mg/dL — ABNORMAL HIGH (ref 6–20)
CO2: 17 mmol/L — ABNORMAL LOW (ref 22–32)
Calcium: 10 mg/dL (ref 8.9–10.3)
Chloride: 97 mmol/L — ABNORMAL LOW (ref 98–111)
Creatinine, Ser: 6.73 mg/dL — ABNORMAL HIGH (ref 0.61–1.24)
GFR, Estimated: 9 mL/min — ABNORMAL LOW (ref >60)
Glucose, Bld: 124 mg/dL — ABNORMAL HIGH (ref 70–99)
Potassium: 4.2 mmol/L (ref 3.5–5.1)
Sodium: 137 mmol/L (ref 135–145)
Total Bilirubin: 0.7 mg/dL (ref 0.0–1.2)
Total Protein: 7.5 g/dL (ref 6.5–8.1)

## 2024-07-26 LAB — GLUCOSE, CAPILLARY
Glucose-Capillary: 104 mg/dL — ABNORMAL HIGH (ref 70–99)
Glucose-Capillary: 118 mg/dL — ABNORMAL HIGH (ref 70–99)

## 2024-07-26 SURGERY — REPAIR, HERNIA, INGUINAL, BILATERAL, ADULT
Anesthesia: General | Laterality: Right

## 2024-07-26 MED ORDER — FENTANYL CITRATE (PF) 100 MCG/2ML IJ SOLN
INTRAMUSCULAR | Status: AC
  Filled 2024-07-26: qty 2

## 2024-07-26 MED ORDER — ORAL CARE MOUTH RINSE
15.0000 mL | Freq: Once | OROMUCOSAL | Status: AC
  Administered 2024-07-26: 15 mL via OROMUCOSAL

## 2024-07-26 MED ORDER — PROPOFOL 10 MG/ML IV BOLUS
INTRAVENOUS | Status: AC
  Filled 2024-07-26: qty 20

## 2024-07-26 MED ORDER — OXYCODONE HCL 5 MG PO TABS: 5.0000 mg | ORAL_TABLET | Freq: Three times a day (TID) | ORAL | 0 refills | Status: AC | PRN

## 2024-07-26 MED ORDER — OXYCODONE HCL 5 MG PO TABS: 5.0000 mg | ORAL_TABLET | Freq: Once | ORAL | Status: DC

## 2024-07-26 MED ORDER — LIDOCAINE HCL (PF) 2 % IJ SOLN
INTRAMUSCULAR | Status: AC
  Filled 2024-07-26: qty 5

## 2024-07-26 MED ORDER — PROPOFOL 10 MG/ML IV BOLUS
INTRAVENOUS | Status: DC | PRN
  Administered 2024-07-26: 50 mg via INTRAVENOUS
  Administered 2024-07-26: 30 mg via INTRAVENOUS

## 2024-07-26 MED ORDER — 0.9 % SODIUM CHLORIDE (POUR BTL) OPTIME
TOPICAL | Status: DC | PRN
Start: 2024-07-26 — End: 2024-07-26
  Administered 2024-07-26: 1000 mL

## 2024-07-26 MED ORDER — FENTANYL CITRATE PF 50 MCG/ML IJ SOSY: 50.0000 ug | PREFILLED_SYRINGE | INTRAMUSCULAR | Status: DC

## 2024-07-26 MED ORDER — SUGAMMADEX SODIUM 200 MG/2ML IV SOLN
INTRAVENOUS | Status: AC
  Filled 2024-07-26: qty 2

## 2024-07-26 MED ORDER — ACETAMINOPHEN 325 MG PO TABS: 650.0000 mg | ORAL_TABLET | Freq: Four times a day (QID) | ORAL | 0 refills | Status: AC

## 2024-07-26 MED ORDER — CLINDAMYCIN PHOSPHATE 900 MG/50ML IV SOLN
900.0000 mg | Freq: Once | INTRAVENOUS | Status: AC
  Administered 2024-07-26: 900 mg via INTRAVENOUS

## 2024-07-26 MED ORDER — SODIUM CHLORIDE 0.9 % IV SOLN: INTRAVENOUS | Status: DC

## 2024-07-26 MED ORDER — DIPHENHYDRAMINE HCL 50 MG/ML IJ SOLN
INTRAMUSCULAR | Status: DC | PRN
  Administered 2024-07-26 (×2): 25 mg via INTRAVENOUS

## 2024-07-26 MED ORDER — FENTANYL CITRATE (PF) 100 MCG/2ML IJ SOLN
INTRAMUSCULAR | Status: DC | PRN
  Administered 2024-07-26: 50 ug via INTRAVENOUS
  Administered 2024-07-26: 25 ug via INTRAVENOUS

## 2024-07-26 MED ORDER — VASOPRESSIN 20 UNIT/ML IV SOLN
INTRAVENOUS | Status: AC
  Filled 2024-07-26: qty 1

## 2024-07-26 MED ORDER — CELECOXIB 200 MG PO CAPS
200.0000 mg | ORAL_CAPSULE | ORAL | Status: AC
  Administered 2024-07-26: 200 mg via ORAL
  Filled 2024-07-26: qty 1

## 2024-07-26 MED ORDER — BUPIVACAINE-EPINEPHRINE (PF) 0.25% -1:200000 IJ SOLN
INTRAMUSCULAR | Status: AC
  Filled 2024-07-26: qty 30

## 2024-07-26 MED ORDER — DEXAMETHASONE SODIUM PHOSPHATE 10 MG/ML IJ SOLN
INTRAMUSCULAR | Status: DC | PRN
  Administered 2024-07-26: 4 mg via INTRAVENOUS

## 2024-07-26 MED ORDER — CEFAZOLIN SODIUM-DEXTROSE 2-4 GM/100ML-% IV SOLN
2.0000 g | INTRAVENOUS | Status: DC
  Filled 2024-07-26: qty 100

## 2024-07-26 MED ORDER — LACTATED RINGERS IV SOLN
INTRAVENOUS | Status: DC
Start: 2024-07-26 — End: 2024-07-26

## 2024-07-26 MED ORDER — DIPHENHYDRAMINE HCL 50 MG/ML IJ SOLN
INTRAMUSCULAR | Status: AC
  Filled 2024-07-26: qty 1

## 2024-07-26 MED ORDER — HYDROCODONE-ACETAMINOPHEN 5-325 MG PO TABS
ORAL_TABLET | ORAL | Status: AC
  Filled 2024-07-26: qty 1

## 2024-07-26 MED ORDER — VASOPRESSIN 20 UNIT/ML IV SOLN
INTRAVENOUS | Status: DC | PRN
  Administered 2024-07-26: 1 [IU] via INTRAVENOUS

## 2024-07-26 MED ORDER — LIDOCAINE HCL (PF) 2 % IJ SOLN
INTRAMUSCULAR | Status: DC | PRN
  Administered 2024-07-26: 40 mg via INTRADERMAL

## 2024-07-26 MED ORDER — CLINDAMYCIN PHOSPHATE 900 MG/50ML IV SOLN
INTRAVENOUS | Status: AC
  Filled 2024-07-26: qty 50

## 2024-07-26 MED ORDER — BUPIVACAINE-EPINEPHRINE 0.25% -1:200000 IJ SOLN
INTRAMUSCULAR | Status: DC | PRN
  Administered 2024-07-26: 20 mL

## 2024-07-26 MED ORDER — INSULIN ASPART 100 UNIT/ML IJ SOLN: 0.0000 [IU] | INTRAMUSCULAR | Status: DC | PRN

## 2024-07-26 MED ORDER — CHLORHEXIDINE GLUCONATE 0.12 % MT SOLN: 15.0000 mL | Freq: Once | OROMUCOSAL | Status: DC

## 2024-07-26 MED ORDER — GABAPENTIN 300 MG PO CAPS
300.0000 mg | ORAL_CAPSULE | ORAL | Status: AC
  Administered 2024-07-26: 300 mg via ORAL
  Filled 2024-07-26: qty 1

## 2024-07-26 MED ORDER — HYDROCODONE-ACETAMINOPHEN 5-325 MG PO TABS
1.0000 | ORAL_TABLET | Freq: Once | ORAL | Status: AC
  Administered 2024-07-26: 1 via ORAL

## 2024-07-26 MED ORDER — ONDANSETRON HCL 4 MG/2ML IJ SOLN
INTRAMUSCULAR | Status: DC | PRN
Start: 2024-07-26 — End: 2024-07-26
  Administered 2024-07-26: 4 mg via INTRAVENOUS

## 2024-07-26 MED ORDER — ROCURONIUM BROMIDE 10 MG/ML (PF) SYRINGE
PREFILLED_SYRINGE | INTRAVENOUS | Status: AC
  Filled 2024-07-26: qty 10

## 2024-07-26 MED ORDER — SUGAMMADEX SODIUM 200 MG/2ML IV SOLN
INTRAVENOUS | Status: DC | PRN
  Administered 2024-07-26: 200 mg via INTRAVENOUS

## 2024-07-26 MED ORDER — IBUPROFEN 200 MG PO TABS: 600.0000 mg | ORAL_TABLET | Freq: Four times a day (QID) | ORAL | 0 refills | Status: AC

## 2024-07-26 MED ORDER — CHLORHEXIDINE GLUCONATE CLOTH 2 % EX PADS: 6.0000 | MEDICATED_PAD | Freq: Once | CUTANEOUS | Status: DC

## 2024-07-26 MED ORDER — ROCURONIUM BROMIDE 10 MG/ML (PF) SYRINGE
PREFILLED_SYRINGE | INTRAVENOUS | Status: DC | PRN
  Administered 2024-07-26: 10 mg via INTRAVENOUS
  Administered 2024-07-26: 70 mg via INTRAVENOUS

## 2024-07-26 MED ORDER — OXYCODONE HCL 5 MG PO TABS
ORAL_TABLET | ORAL | Status: AC
  Filled 2024-07-26: qty 1

## 2024-07-26 MED ORDER — MIDAZOLAM HCL 2 MG/2ML IJ SOLN
1.0000 mg | INTRAMUSCULAR | Status: DC
  Filled 2024-07-26: qty 2

## 2024-07-26 MED ORDER — ACETAMINOPHEN 500 MG PO TABS
1000.0000 mg | ORAL_TABLET | ORAL | Status: AC
  Administered 2024-07-26: 1000 mg via ORAL
  Filled 2024-07-26: qty 2

## 2024-07-26 MED ORDER — PHENYLEPHRINE HCL-NACL 20-0.9 MG/250ML-% IV SOLN
INTRAVENOUS | Status: DC | PRN
  Administered 2024-07-26: 50 ug/min via INTRAVENOUS

## 2024-07-26 SURGICAL SUPPLY — 47 items
APPLICATOR COTTON TIP 6 STRL (MISCELLANEOUS) IMPLANT
BAG COUNTER SPONGE SURGICOUNT (BAG) ×1 IMPLANT
BLADE SURG 15 STRL LF DISP TIS (BLADE) ×2 IMPLANT
BLADE SURG SZ10 CARB STEEL (BLADE) IMPLANT
BLADE SURG SZ11 CARB STEEL (BLADE) ×2 IMPLANT
CHLORAPREP W/TINT 26 (MISCELLANEOUS) ×4 IMPLANT
COVER SURGICAL LIGHT HANDLE (MISCELLANEOUS) ×4 IMPLANT
DERMABOND ADVANCED .7 DNX12 (GAUZE/BANDAGES/DRESSINGS) ×3 IMPLANT
DISSECTOR ROUND CHERRY 3/8 STR (MISCELLANEOUS) IMPLANT
DRAIN PENROSE 0.5X18 (DRAIN) IMPLANT
DRAPE LAPAROTOMY T 102X78X121 (DRAPES) ×2 IMPLANT
DRAPE LAPAROTOMY TRNSV 102X78 (DRAPES) ×2 IMPLANT
DRAPE UTILITY XL STRL (DRAPES) ×2 IMPLANT
ELECT PENCIL ROCKER SW 15FT (MISCELLANEOUS) ×2 IMPLANT
ELECT REM PT RETURN 15FT ADLT (MISCELLANEOUS) ×4 IMPLANT
FORCEPS BPLR 8 MD DVNC XI (FORCEP) ×2 IMPLANT
GAUZE 4X4 16PLY ~~LOC~~+RFID DBL (SPONGE) ×4 IMPLANT
GLOVE BIO SURGEON STRL SZ7 (GLOVE) ×6 IMPLANT
GLOVE INDICATOR 7.5 STRL GRN (GLOVE) ×2 IMPLANT
GOWN STRL REUS W/ TWL XL LVL3 (GOWN DISPOSABLE) ×6 IMPLANT
KIT BASIN OR (CUSTOM PROCEDURE TRAY) ×4 IMPLANT
KIT TURNOVER KIT A (KITS) ×4 IMPLANT
MARKER SKIN DUAL TIP RULER LAB (MISCELLANEOUS) ×4 IMPLANT
MESH BARD SOFT 3X6IN (Mesh General) ×1 IMPLANT
NDL HYPO 22X1.5 SAFETY MO (MISCELLANEOUS) ×1 IMPLANT
NDL HYPO 25X1 1.5 SAFETY (NEEDLE) ×1 IMPLANT
NDL INSUFFLATION 14GA 120MM (NEEDLE) ×1 IMPLANT
NEEDLE HYPO 22X1.5 SAFETY MO (MISCELLANEOUS) ×2 IMPLANT
NEEDLE HYPO 25X1 1.5 SAFETY (NEEDLE) ×2 IMPLANT
NEEDLE INSUFFLATION 14GA 120MM (NEEDLE) ×2 IMPLANT
PACK BASIC VI WITH GOWN DISP (CUSTOM PROCEDURE TRAY) ×2 IMPLANT
SPIKE FLUID TRANSFER (MISCELLANEOUS) ×4 IMPLANT
SUT MNCRL AB 4-0 PS2 18 (SUTURE) ×4 IMPLANT
SUT PDS AB 2-0 CT2 27 (SUTURE) ×4 IMPLANT
SUT PROLENE 2 0 SH DA (SUTURE) ×4 IMPLANT
SUT SILK 3-0 18XBRD TIE 12 (SUTURE) IMPLANT
SUT STRATA PDS 2-0 23 CT-1 (SUTURE) IMPLANT
SUT STRATAFIX SPIRAL PDS3-0 (SUTURE) ×2 IMPLANT
SUT VIC AB 2-0 SH 27X BRD (SUTURE) IMPLANT
SUT VIC AB 3-0 SH 18 (SUTURE) ×3 IMPLANT
SUT VIC AB 3-0 SH 27X BRD (SUTURE) ×4 IMPLANT
SUT VIC AB 3-0 SH 27XBRD (SUTURE) ×4 IMPLANT
SYR 20ML LL LF (SYRINGE) ×4 IMPLANT
SYR BULB IRRIG 60ML STRL (SYRINGE) IMPLANT
TOWEL GREEN STERILE FF (TOWEL DISPOSABLE) ×2 IMPLANT
TOWEL OR 17X26 10 PK STRL BLUE (TOWEL DISPOSABLE) ×4 IMPLANT
YANKAUER SUCT BULB TIP 10FT TU (MISCELLANEOUS) ×2 IMPLANT

## 2024-07-26 NOTE — H&P (Signed)
 [name] 01-18-1966  [id number].    HPI:  58 y/o M with significant CAD and PAD as well as ESRD on iHD who presents for inguinal hernia repair. He reports that he is in his usual state of health and denies any recent changes in medication.   ROS: Review of Systems  Constitutional: Negative.   HENT: Negative.    Eyes: Negative.   Respiratory: Negative.    Cardiovascular: Negative.   Gastrointestinal: Negative.   Genitourinary: Negative.   Musculoskeletal: Negative.   Skin: Negative.   Neurological: Negative.   Endo/Heme/Allergies: Negative.   Psychiatric/Behavioral: Negative.      Family History  Problem Relation Age of Onset   Diabetes Mother     Past Medical History:  Diagnosis Date   Acute osteomyelitis of right foot (HCC) 05/15/2019   Anemia 06/23/2019   Aortic stenosis    Arthritis    Bursitis    right shoulder   Complication of anesthesia    need benadryal with surgery d/t allergy to pain meds   Coronary artery disease    Diabetes with renal manifestations(250.4) 08/23/2013   Diabetic infection of right foot (HCC)    Diabetic retinopathy associated with type 2 diabetes mellitus (HCC) 06/23/2019   ESRD on hemodialysis (HCC) 07/18/2011   Essential hypertension 10/18/2007   Qualifier: Diagnosis of  By: Kassie MD, Alyce LABOR    Family history of adverse reaction to anesthesia    sister get allegeric reaction to all pain meds   GERD (gastroesophageal reflux disease)    Heart murmur    History of blood transfusion    HYPERLIPIDEMIA 05/16/2008   Qualifier: Diagnosis of  By: Norleen MD, [name] ORN    MRSA INFECTION 10/18/2007   Qualifier: Diagnosis of  By: Wilhemina RMA, Lucy     Myocardial infarction [facility])    Other forms of retinal detachment(361.89) 06/23/2019   PAD (peripheral artery disease)    PONV (postoperative nausea and vomiting)    Prolonged QT interval    Pulmonary hypertension (HCC)    Secondary hyperparathyroidism 06/23/2019   Sepsis (HCC)  05/14/2019    Past Surgical History:  Procedure Laterality Date   A/V FISTULAGRAM N/A 12/29/2023   Procedure: A/V Fistulagram;  Surgeon: Norine Manuelita LABOR, MD;  Location: [facility] INVASIVE CV LAB;  Service: Cardiovascular;  Laterality: N/A;   A/V SHUNT INTERVENTION N/A 03/17/2024   Procedure: A/V SHUNT INTERVENTION;  Surgeon: Gretta Lonni PARAS, MD;  Location: [facility] PV LAB;  Service: Cardiovascular;  Laterality: N/A;  cephalic arch   ABDOMINAL AORTOGRAM N/A 05/17/2019   Procedure: ABDOMINAL AORTOGRAM;  Surgeon: Harvey Carlin BRAVO, MD;  Location: [facility] INVASIVE CV LAB;  Service: Cardiovascular;  Laterality: N/A;   AMPUTATION TOE Right 05/14/2019   Procedure: PARTIAL AMPUTATION SECOND TOE RIGHT FOOT;  Surgeon: Gretel Ozell PARAS, DPM;  Location: [facility] OR;  Service: Podiatry;  Laterality: Right;   AMPUTATION TOE Right 05/23/2019   Procedure: AMPUTATION OF SECOND TOE METATARSAL PHALANGEAL JOINT;  Surgeon: Gretel Ozell PARAS, DPM;  Location: [facility] OR;  Service: Podiatry;  Laterality: Right;   AMPUTATION TOE Right 05/27/2019   Procedure: Amputation Toe;  Surgeon: Gretel Ozell PARAS, DPM;  Location: [facility] OR;  Service: Podiatry;  Laterality: Right;  right third toe   APPLICATION OF WOUND VAC Right 05/27/2019   Procedure: Application Of Wound Vac;  Surgeon: Gretel Ozell PARAS, DPM;  Location: [facility] OR;  Service: Podiatry;  Laterality: Right;   AV FISTULA PLACEMENT  06-18-11   Right brachiocephalic  AVF   BONE BIOPSY Right 05/14/2019   Procedure: OPEN SUPERFICIAL BONE BIOPSY GREAT TOE;  Surgeon: Gretel Ozell PARAS, DPM;  Location: [facility] OR;  Service: Podiatry;  Laterality: Right;   CARDIAC CATHETERIZATION     CORONARY BALLOON ANGIOPLASTY N/A 09/24/2020   Procedure: CORONARY BALLOON ANGIOPLASTY;  Surgeon: Swaziland, Peter M, MD;  Location: [facility] INVASIVE CV LAB;  Service: Cardiovascular;  Laterality: N/A;   CORONARY PRESSURE/FFR STUDY N/A 09/24/2020   Procedure: INTRAVASCULAR PRESSURE WIRE/FFR STUDY;  Surgeon: Swaziland, Peter M, MD;  Location: [facility] INVASIVE  CV LAB;  Service: Cardiovascular;  Laterality: N/A;   CORONARY STENT INTERVENTION N/A 09/24/2020   Procedure: CORONARY STENT INTERVENTION;  Surgeon: Swaziland, Peter M, MD;  Location: [facility] INVASIVE CV LAB;  Service: Cardiovascular;  Laterality: N/A;   EYE SURGERY     left eye for Laser, diabetic retinopathy   FISTULOGRAM Right 11/06/2011   Procedure: FISTULOGRAM;  Surgeon: Redell LITTIE Door, MD;  Location: [facility] CATH LAB;  Service: Cardiovascular;  Laterality: Right;   HEMATOMA EVACUATION Right Feb. 25, 2014   HEMATOMA EVACUATION Right 12/14/2012   Procedure: EVACUATION HEMATOMA;  Surgeon: Redell LITTIE Door, MD;  Location: [facility] OR;  Service: Vascular;  Laterality: Right;   INCISION AND DRAINAGE Right 05/23/2019   Procedure: INCISION AND DRAINAGE OF RIGHT FOOT DEEP SPACE ABSCESS;  Surgeon: Gretel Ozell PARAS, DPM;  Location: [facility] OR;  Service: Podiatry;  Laterality: Right;   INJECTION OF SILICONE OIL Left 01/09/2023   Procedure: INJECTION OF SILICONE OIL;  Surgeon: Elner Arley LABOR, MD;  Location: [facility] OR;  Service: Ophthalmology;  Laterality: Left;   INSERTION OF ARTERIOVENOUS (AV) ARTEGRAFT ARM Left 06/07/2024   Procedure: INSERTION LEFT UPPER ARM ARTERIOVENOUS GRAFT;  Surgeon: Pearline Norman RAMAN, MD;  Location: [facility] OR;  Service: Vascular;  Laterality: Left;  LEFT UPPER EXTREMITY   INSERTION OF DIALYSIS CATHETER  12/14/2012   Procedure: INSERTION OF DIALYSIS CATHETER;  Surgeon: Redell LITTIE Door, MD;  Location: [facility] OR;  Service: Vascular;;   INSERTION OF DIALYSIS CATHETER Right 01/07/2021   Procedure: INSERTION OF DIALYSIS CATHETER;  Surgeon: Harvey Carlin BRAVO, MD;  [facility] OR;  Service: Vascular;  Laterality: Right;   INSERTION OF DIALYSIS CATHETER Left 01/08/2021   Procedure: INSERTION OF LEFT INTERNAL JUGULAR DIALYSIS CATHETER, tunneled;  Surgeon: Eliza Lonni RAMAN, MD;  Location: [facility] OR;  Service: Vascular;  Laterality: Left;   INSERTION OF DIALYSIS CATHETER Left 06/07/2024   Procedure: INSERTION OF DIALYSIS CATHETER;   Surgeon: Pearline Norman RAMAN, MD;  Location: [facility] OR;  Service: Vascular;  Laterality: Left;  TUNNELED DIALYSIS   IRRIGATION AND DEBRIDEMENT FOOT Right 05/25/2019   Procedure: INCISION AND DRAINAGE FOOT;  Surgeon: Gretel Ozell PARAS, DPM;  Location: [facility] OR;  Service: Podiatry;  Laterality: Right;   IRRIGATION AND DEBRIDEMENT FOOT Right 05/27/2019   Procedure: IRRIGATION AND DEBRIDEMENT FOOT;  Surgeon: Gretel Ozell PARAS, DPM;  Location[facility] OR;  Service: Podiatry;  Laterality: Right;   LASER PHOTO ABLATION Left 01/09/2023   Procedure: LASER PHOTO ABLATION;  Surgeon: Elner Arley LABOR, MD;  Location: [facility] OR;  Service: Ophthalmology;  Laterality: Left;   LEFT HEART CATH AND CORONARY ANGIOGRAPHY N/A 09/24/2020   Procedure: LEFT HEART CATH AND CORONARY ANGIOGRAPHY;  Surgeon: Swaziland, Peter M, MD;  Location: [facility] INVASIVE CV LAB;  Service: Cardiovascular;  Laterality: N/A;   LOWER EXTREMITY ANGIOGRAPHY Bilateral 05/17/2019   Procedure: LOWER EXTREMITY ANGIOGRAPHY;  Surgeon: Harvey Carlin BRAVO, MD;  Location: [facility]VAS[facility] CV LAB;  Service: Cardiovascular;  Laterality:  Bilateral;   PARS PLANA VITRECTOMY Left 01/09/2023   Procedure: PARS PLANA VITRECTOMY WITH 25 GAUGE AND REPAIR OF RETINAL DETACHMENT;  Surgeon: Elner Arley LABOR, MD;  Location: [facility] OR;  Service: Ophthalmology;  Laterality: Left;   REMOVAL OF A DIALYSIS CATHETER Right 01/08/2021   Procedure: REMOVAL OF TEMPORARY DIALYSIS CATHETER, LEFT FEMORAL VEIN;  Surgeon: Eliza Lonni RAMAN, MD;  Location: [facility] OR;  Service: Vascular;  Laterality: Right;   REVISON OF ARTERIOVENOUS FISTULA Right 12/14/2012   Procedure: REVISON OF upper arm ARTERIOVENOUS FISTULA using 54mmx10cm gortex graft;  Surgeon: Redell LITTIE Door, MD;  Location: [facility] OR;  Service: Vascular;  Laterality: Right;   REVISON OF ARTERIOVENOUS FISTULA Right 12/27/2020   Procedure: RIGHT UPPER EXTREMITY ARTERIOVENOUS FISTULA REVISiON;  Surgeon: Magda Debby SAILOR, MD;  Location: [facility] OR;  Service: Vascular;  Laterality: Right;   PERIPHERAL NERVE BLOCK   RIGHT HEART CATH N/A 06/03/2024   Procedure: RIGHT HEART CATH;  Surgeon: Cherrie Toribio SAUNDERS, MD;  Location: [facility] INVAS[facility] CV LAB;  Service: Cardiovascular;  Laterality: N/A;   RIGHT/LEFT HEART CATH AND CORONARY ANGIOGRAPHY N/A 08/07/2020   Procedure: RIGHT/LEFT HEART CATH AND CORONARY ANGIOGRAPHY;  Surgeon: Swaziland, Peter M, MD;  Location: [facility] INVASIVE CV LAB;  Service: Cardiovascular;  Laterality: N/A;   RIGHT/LEFT HEART CATH AND CORONARY ANGIOGRAPHY N/A 05/31/2024   Procedure: RIGHT/LEFT HEART CATH AND CORONARY ANGIOGRAPHY;  Surgeon: Elmira Newman PARAS, MD;  Location: [facility] INVASIVE CV LAB;  Service: Cardiovascular;  Laterality: N/A;   SOFT TISSUE MASS EXCISION     Right arm, Left leg  for MRSA infection   ULTRASOUND GUIDANCE FOR VASCULAR ACCESS Left 06/07/2024   Procedure: ULTRASOUND GUIDANCE, FOR VASCULAR ACCESS;  Surgeon: Pearline Norman RAMAN, MD;  Location: [facility] OR;  Service: Vascular;  Laterality: Left;   VENOUS ANGIOPLASTY  12/29/2023   Procedure: VENOUS ANGIOPLASTY;  Surgeon: Norine Manuelita LABOR, MD;  Location: [facility] INVASIVE CV LAB;  Service: Cardiovascular;;  Cephalic Arch; Intragraft; Distal Edge of Stent   VENOUS ANGIOPLASTY  03/17/2024   Procedure: VENOUS ANGIOPLASTY;  Surgeon: Gretta Lonni PARAS, MD;  Location: [facility] LAB;  Service: Cardiovascular;;    Social History:  reports that he has never smoked. He has been exposed to tobacco smoke. He has never used smokeless tobacco. He reports that he does not currently use alcohol . He reports that he does not use drugs.  Allergies:  Allergies  Allergen Reactions   Crestor [Rosuvastatin]     Other reaction(s): Muscle Pain   Fentanyl  Itching    Patient received Fentanyl  during procedure and had reaction, medicinal intervention applied.    Lipitor [Atorvastatin ]     Other reaction(s): Muscle Pain   Amoxicillin Itching    Did it involve swelling of the face/tongue/throat, SOB, or low BP? Unknown Did it involve sudden or  severe rash/hives, skin peeling, or any reaction on the inside of your mouth or nose? Unknown Did you need to seek medical attention at a hospital or doctor's office? Unknown When did it last happen?      20 years ago If all above answers are NO, may proceed with cephalosporin use.    Cefazolin  Swelling and Other (See Comments)    LIP SWELLING, patient unsure if this was stress related. No other airway symptoms.   Chlorhexidine  Hives    Patient has blistering   Hydromorphone  Itching    Patient states he may take with benadryl . Makes feet itch.   Oxycodone  Rash and Other (See Comments)   Percocet [Oxycodone -Acetaminophen ] Itching  and Other (See Comments)    Legs only. Itching and burning feeling. Can tolerate with hydroxyzine     Lyrica [Pregabalin]     Vision problem, nervousness   Morphine      Other reaction(s): rash/itching All narcotics per pt   Tape Hives    Adhesive tape = blisters; PAPER tape only!   Gabapentin Nausea And Vomiting    Stomach issues, vision problems, nervousness    Medications Prior to Admission  Medication Sig Dispense Refill   aspirin  EC 81 MG tablet Take 1 tablet (81 mg total) by mouth daily. 90 tablet 3   Continuous Blood Gluc Sensor (FREESTYLE LIBRE 14 DAY SENSOR) MISC See admin instructions.     EPINEPHrine  (EPIPEN  2-PAK) 0.3 mg/0.3 mL IJ SOAJ injection Inject 0.3 mg into the muscle as needed for anaphylaxis. 2 each 0   Evolocumab  (REPATHA  SURECLICK) 140 MG/ML SOAJ Inject 140 mg into the skin every 14 (fourteen) days. 6 mL 3   multivitamin (RENA-VIT) TABS tablet Take 1 tablet by mouth daily with lunch.     prednisoLONE  acetate (PRED FORTE ) 1 % ophthalmic suspension Place 1 drop into the right eye daily.     sevelamer  carbonate (RENVELA ) 800 MG tablet Take 1,600-2,400 mg by mouth 3 (three) times daily with meals.     sodium zirconium cyclosilicate  (LOKELMA ) 10 g PACK packet Take 10 g by mouth every Tuesday, Thursday, Saturday, and Sunday.     traZODone   (DESYREL ) 150 MG tablet Take 150 mg by mouth at bedtime as needed for sleep.     nitroGLYCERIN  (NITROSTAT ) 0.4 MG SL tablet Place 0.4 mg under the tongue every 5 (five) minutes as needed for chest pain.      Physical Exam: Blood pressure (!) 138/91, pulse 81, temperature 98.6 F (37 C), temperature source Oral, resp. rate 18, height 5' 10 (1.778 m), weight 71.8 kg, SpO2 98%. Gen: male, NAD Groin: Right bulge, easily reducible  Results for orders placed or performed during the hospital encounter of 07/26/24 (from the past 48 hours)  Glucose, capillary     Status: Abnormal   Collection Time: 07/26/24 10:33 AM  Result Value Ref Range   Glucose-Capillary 104 (H) 70 - 99 mg/dL    Comment: Glucose reference range applies only to samples taken after fasting for at least 8 hours.  Comprehensive metabolic panel     Status: Abnormal   Collection Time: 07/26/24 11:19 AM  Result Value Ref Range   Sodium 137 135 - 145 mmol/L    Comment: Electrolytes repeated to verify    Potassium 4.2 3.5 - 5.1 mmol/L   Chloride 97 (L) 98 - 111 mmol/L   CO2 17 (L) 22 - 32 mmol/L   Glucose, Bld 124 (H) 70 - 99 mg/dL    Comment: Glucose reference range applies only to samples taken after fasting for at least 8 hours.   BUN 46 (H) 6 - 20 mg/dL   Creatinine, Ser 3.26 (H) 0.61 - 1.24 mg/dL   Calcium  10.0 8.9 - 10.3 mg/dL   Total Protein 7.5 6.5 - 8.1 g/dL   Albumin  4.3 3.5 - 5.0 g/dL   AST 27 15 - 41 U/L   ALT 15 0 - 44 U/L   Alkaline Phosphatase 152 (H) 38 - 126 U/L   Total Bilirubin 0.7 0.0 - 1.2 mg/dL   GFR, Estimated 9 (L) >60 mL/min    Comment: (NOTE) Calculated using the CKD-EPI Creatinine Equation (2021)    Anion gap 22 (H) 5 - 15  Comment: Performed at [facility], [address], [place], [place] [address]   No results found.  Assessment/Plan 58 y/o M with bilateral inguinal hernias  - Had a long discussion with the patient as well as anesthesia. He has been cleared by  cardiology for surgery but remains high risk. I do not feel that an MIS approach would be in his best interested given his cardiac history. Will plan to repair the more symptomatic right side today and can evaluate for left-sided repair in the future - Will proceed to the OR. We discussed the alternatives and potential risks of surgery, including but not limited to: bleeding, infection, damage to bowel or surrounding structures, damage to the vas/cord, mesh complications, chronic pain, recurrent hernia, and need for additional procedures. All questions were addressed and consent was obtained.    Cordella DELENA Polly Marlis Cheron Surgery 07/26/2024, 12:32 PM Please see Amion for pager number during day hours 7:00am-4:30pm or 7:00am -11:30am on weekends

## 2024-07-26 NOTE — Anesthesia Procedure Notes (Signed)
 Arterial Line Insertion Start/End10/04/2024 1:10 PM, 07/26/2024 1:12 PM Performed by: Keneth [name] POUR, MD  Patient location: Pre-op . Preanesthetic checklist: patient identified, IV checked, site marked, risks and benefits discussed, surgical consent, monitors and equipment checked, pre-op  evaluation, timeout performed and anesthesia consent Lidocaine  1% used for infiltration Right, radial was placed Catheter size: 20 G Hand hygiene performed  and maximum sterile barriers used   Attempts: 1 Following insertion, dressing applied. Post procedure assessment: normal and unchanged  Patient tolerated the procedure well with no immediate complications. Additional procedure comments: Ultrasound guided however no image saved.

## 2024-07-26 NOTE — Anesthesia Procedure Notes (Signed)
 Procedure Name: Intubation Date/Time: 07/26/2024 1:13 PM  Performed by: Metta Andrea NOVAK, CRNAPre-anesthesia Checklist: Patient identified, Emergency Drugs available, Suction available, Patient being monitored and Timeout performed Patient Re-evaluated:Patient Re-evaluated prior to induction Oxygen  Delivery Method: Circle system utilized Preoxygenation: Pre-oxygenation with 100% oxygen  Induction Type: IV induction Ventilation: Mask ventilation without difficulty Laryngoscope Size: Mac and 4 Grade View: Grade I Tube type: Oral Tube size: 7.5 mm Number of attempts: 1 Airway Equipment and Method: Stylet Placement Confirmation: ETT inserted through vocal cords under direct vision, positive ETCO2 and breath sounds checked- equal and bilateral Secured at: 23 cm Tube secured with: Tape Dental Injury: Teeth and Oropharynx as per pre-operative assessment

## 2024-07-26 NOTE — Op Note (Signed)
 Post-Op Note/Post-Procedure Note  Patient: [name] MRN: [id number] DOB: July 02, 1966 Sex: male Operation/Procedure Date: 07/26/2024 Surgeons and Role:    * Polly Cordella LABOR, MD - Primary  Pre-operative Diagnoses: Right Inguinal Hernia Postoperative Diagnoses: Same  Procedure performed: Open right inguinal hernia repair  Side: Right Anesthesia: General  Indications: [name] is a 58 y.o. year old male who presents for open right inguinal hernia repair. Preoperatively, I discussed in detail the risks, benefits, alternatives, and potential complications. The patient understands and requests to proceed.  Operative Findings: Large indirect right inguinal hernia  Technique: The patient was positively identified, was taken to the operating room and placed supine on the operating table. The patient had voided prior to the procedure. The arms were carefully left out and padded. After establishment of deep sedation by the anesthesia team, the lower abdomen was prepped and draped in the usual sterile surgical fashion. A time-out was performed confirming correct patient and procedure. We identified the right anterosuperior iliac spine and the left pubic tubercle. A field block was performed using a combination of 0.25% Marcaine  with epinephrine . We then made a 7-cm oblique incision just above the right pubic tubercle. This was done with #15 blade and the subcutaneous tissues were divided. The expected vein in the subcutaneous space laterally was controlled using the Bovie. We then incised the Scarpa's fascia and soon encountered the external oblique aponeurosis. We identified the femoral vessels and confirmed no femoral hernia. Several milliliters of the local mixture was infiltrated below this to elevate it off the underlying cord structures. A #15 blade was used to incise the external oblique aponeurosis and this was divided obliterating the external ring. The division of the external oblique was  taken up cephalad for an additional 3 cm. We then encircled the cord structures, and controlled these with a Penrose drain. We then explored the cord by dividing the cremaster muscles. We identified the testicular vessels and the vas. There was a large indirect hernia sac that appeared chronic and was quite adherent to the cord structures. Using I combination of blunt dissection and selective electrocautery I was able to carefully free the hernia sac from the cord structure. I incised the sac and identified a small volume of ascites but no evidence of bowel or bowel injury. A high ligation of the sac was performed. There was a small cord lipoma which was excised. Hemostasis was observed. He had no obvious femoral hernia palpated or direct hernia. I brought Bard Softmesh 15cm x 7cm and placed this as an onlay in a modified Lichtenstein fashion using running 2-0 Prolene suture inferiorly along the shelving edge of the inguinal ligament. Superiorly, we place interrupted 2-0 PDS suture fixating the mesh to the conjoint tendon taking care not to injure or entrap nervous structures. We made a new internal ring with 3-0 Vicryl suture and made sure this was not too tight around the cord structures. We irrigated the dissection field with warm sterile saline. The tails were approximated using a single interrupted 3-0 Vicryl suture. The entire mesh and tails were tucked up under the external oblique aponeurosis. We closed the external oblique aponeurosis with running 3-0 Vicryl suture. In doing this, we created a new external ring. The subcutaneous tissues were infiltrated with the local mixture. Scarpa's layer was closed using running 3-0 Vicryl suture. In addition, we closed the deep dermal layers using interrupted 3-0 Vicryl suture. A 4-0 Monocryl running subcuticular suture was used to close the skin. Final dressing  of Dermabond was placed.  All sponge, needle, and instrument counts were reported correct at the end of  the case. We confirmed that the testicles were in their proper position in the scrotal sac at the end of the case.   Estimated Blood Loss: Minimal. Specimens: Hernia sac Implants:  Implant Name Type Inv. Item Serial No. Manufacturer Lot No. LRB No. Used Action  MESH BARD SOFT 3X6IN - [id number] Mesh General MESH BARD SOFT ABBE HIPOLITO HAIL BARD ACCESS [id number] Right 1 Implanted   Drains: None. Complications: * No complications entered in OR log * Condition of the patient: Good, emerged from sedation Disposition: PACU  Cordella [name] Date: 07/26/2024 Time: 8:11 PM

## 2024-07-26 NOTE — Transfer of Care (Signed)
 Immediate Anesthesia Transfer of Care Note  Patient: [name]  Procedure(s) Performed: REPAIR, HERNIA, INGUINAL, BILATERAL, ADULT (Right)  Patient Location: PACU  Anesthesia Type:GA combined with regional for post-op pain  Level of Consciousness: awake and patient cooperative  Airway & Oxygen  Therapy: Patient Spontanous Breathing and Patient connected to nasal cannula oxygen   Post-op Assessment: Report given to RN and Post -op Vital signs reviewed and stable  Post vital signs: Reviewed and stable  Last Vitals:  Vitals Value Taken Time  BP    Temp    Pulse    Resp    SpO2      Last Pain:  Vitals:   07/26/24 1250  TempSrc:   PainSc: 0-No pain      Patients Stated Pain Goal: 5 (07/26/24 1049)  Complications: No notable events documented.

## 2024-07-27 ENCOUNTER — Encounter (HOSPITAL_COMMUNITY): Payer: Self-pay | Admitting: General Surgery

## 2024-07-27 NOTE — Anesthesia Postprocedure Evaluation (Signed)
 Anesthesia Post Note  Patient: [name]  Procedure(s) Performed: REPAIR, HERNIA, INGUINAL, BILATERAL, ADULT (Right)     Patient location during evaluation: PACU Anesthesia Type: General Level of consciousness: awake and alert Pain management: pain level controlled Vital Signs Assessment: post-procedure vital signs reviewed and stable Respiratory status: spontaneous breathing, nonlabored ventilation, respiratory function stable and patient connected to nasal cannula oxygen  Cardiovascular status: blood pressure returned to baseline and stable Postop Assessment: no apparent nausea or vomiting Anesthetic complications: no   No notable events documented.  Last Vitals:  Vitals:   07/26/24 1730 07/26/24 1745  BP: 123/79 (!) 105/57  Pulse: 80   Resp:    Temp: 36.6 C   SpO2: 93%     Last Pain:  Vitals:   07/26/24 1815  TempSrc:   PainSc: 4                  [name] MARLA Cornea

## 2024-07-28 LAB — SURGICAL PATHOLOGY

## 2024-07-28 NOTE — Therapy (Incomplete)
 OUTPATIENT OCCUPATIONAL THERAPY NEURO EVALUATION  Patient Name: [name] MRN: [id number] DOB:08/01/1966, 58 y.o., male Today's Date: 07/28/2024  PCP: *** REFERRING PROVIDER: Pearline Norman RAMAN, MD  END OF SESSION:   Past Medical History:  Diagnosis Date   Acute osteomyelitis of right foot (HCC) 05/15/2019   Anemia 06/23/2019   Aortic stenosis    Arthritis    Bursitis    right shoulder   Complication of anesthesia    need benadryal with surgery d/t allergy to pain meds   Coronary artery disease    Diabetes with renal manifestations(250.4) 08/23/2013   Diabetic infection of right foot (HCC)    Diabetic retinopathy associated with type 2 diabetes mellitus (HCC) 06/23/2019   ESRD on hemodialysis (HCC) 07/18/2011   Essential hypertension 10/18/2007   Qualifier: Diagnosis of  By: Kassie MD, Alyce LABOR    Family history of adverse reaction to anesthesia    sister get allegeric reaction to all pain meds   GERD (gastroesophageal reflux disease)    Heart murmur    History of blood transfusion    HYPERLIPIDEMIA 05/16/2008   Qualifier: Diagnosis of  By: Norleen MD, [name] ORN    MRSA INFECTION 10/18/2007   Qualifier: Diagnosis of  By: Wilhemina RMA, Lucy     Myocardial infarction [facility])    Other forms of retinal detachment(361.89) 06/23/2019   PAD (peripheral artery disease)    PONV (postoperative nausea and vomiting)    Prolonged QT interval    Pulmonary hypertension (HCC)    Secondary hyperparathyroidism 06/23/2019   Sepsis (HCC) 05/14/2019   Past Surgical History:  Procedure Laterality Date   A/V FISTULAGRAM N/A 12/29/2023   Procedure: A/V Fistulagram;  Surgeon: Norine Manuelita LABOR, MD;  Location: [facility] INVASIVE CV LAB;  Service: Cardiovascular;  Laterality: N/A;   A/V SHUNT INTERVENTION N/A 03/17/2024   Procedure: A/V SHUNT INTERVENTION;  Surgeon: Gretta Lonni PARAS, MD;  Location: [facility] PV LAB;  Service: Cardiovascular;  Laterality: N/A;  cephalic arch   ABDOMINAL AORTOGRAM N/A 05/17/2019    Procedure: ABDOMINAL AORTOGRAM;  Surgeon: Harvey Carlin BRAVO, MD;  Location: [facility] INVASIVE CV LAB;  Service: Cardiovascular;  Laterality: N/A;   AMPUTATION TOE Right 05/14/2019   Procedure: PARTIAL AMPUTATION SECOND TOE RIGHT FOOT;  Surgeon: Gretel Ozell PARAS, DPM;  Location: [facility] OR;  Service: Podiatry;  Laterality: Right;   AMPUTATION TOE Right 05/23/2019   Procedure: AMPUTATION OF SECOND TOE METATARSAL PHALANGEAL JOINT;  Surgeon: Gretel Ozell PARAS, DPM;  Location: [facility] OR;  Service: Podiatry;  Laterality: Right;   AMPUTATION TOE Right 05/27/2019   Procedure: Amputation Toe;  Surgeon: Gretel Ozell PARAS, DPM;  Location: [facility] OR;  Service: Podiatry;  Laterality: Right;  right third toe   APPLICATION OF WOUND VAC Right 05/27/2019   Procedure: Application Of Wound Vac;  Surgeon: Gretel Ozell PARAS, DPM;  Location: [facility] OR;  Service: Podiatry;  Laterality: Right;   AV FISTULA PLACEMENT  06-18-11   Right brachiocephalic AVF   BONE BIOPSY Right 05/14/2019   Procedure: OPEN SUPERFICIAL BONE BIOPSY GREAT TOE;  Surgeon: Gretel Ozell PARAS, DPM;  Location: [facility] OR;  Service: Podiatry;  Laterality: Right;   CARDIAC CATHETERIZATION     CORONARY BALLOON ANGIOPLASTY N/A 09/24/2020   Procedure: CORONARY BALLOON ANGIOPLASTY;  Surgeon: Swaziland, Peter M, MD;  Location: [facility] INVASIVE CV LAB;  Service: Cardiovascular;  Laterality: N/A;   CORONARY PRESSURE/FFR STUDY N/A 09/24/2020   Procedure: INTRAVASCULAR PRESSURE WIRE/FFR STUDY;  Surgeon: Swaziland, Peter M, MD;  Location: [facility] INVASIVE CV  LAB;  Service: Cardiovascular;  Laterality: N/A;   CORONARY STENT INTERVENTION N/A 09/24/2020   Procedure: CORONARY STENT INTERVENTION;  Surgeon: Swaziland, Peter M, MD;  Location: [facility] INVASIVE CV LAB;  Service: Cardiovascular;  Laterality: N/A;   EYE SURGERY     left eye for Laser, diabetic retinopathy   FISTULOGRAM Right 11/06/2011   Procedure: FISTULOGRAM;  Surgeon: Redell LITTIE Door, MD;  Location: [facility] CATH LAB;  Service: Cardiovascular;  Laterality: Right;    HEMATOMA EVACUATION Right Feb. 25, 2014   HEMATOMA EVACUATION Right 12/14/2012   Procedure: EVACUATION HEMATOMA;  Surgeon: Redell LITTIE Door, MD;  Location: [facility] OR;  Service: Vascular;  Laterality: Right;   INCISION AND DRAINAGE Right 05/23/2019   Procedure: INCISION AND DRAINAGE OF RIGHT FOOT DEEP SPACE ABSCESS;  Surgeon: Gretel Ozell PARAS, DPM;  Location: [facility];  Service: Podiatry;  Laterality: Right;   INGUINAL HERNIA REPAIR Right 07/26/2024   Procedure: REPAIR, HERNIA, INGUINAL, BILATERAL, ADULT;  Surgeon: Polly Cordella LABOR, MD;  Location: [facility] ORS;  Service: General;  Laterality: Right;   INJECTION OF SILICONE OIL Left 01/09/2023   Procedure: INJECTION OF SILICONE OIL;  Surgeon: Elner Arley LABOR, MD;  Location: [facility] OR;  Service: Ophthalmology;  Laterality: Left;   INSERTION OF ARTERIOVENOUS (AV) ARTEGRAFT ARM Left 06/07/2024   Procedure: INSERTION LEFT UPPER ARM ARTERIOVENOUS GRAFT;  Surgeon: Pearline Norman RAMAN, MD;  Location: [facility] OR;  Service: Vascular;  Laterality: Left;  LEFT UPPER EXTREMITY   INSERTION OF DIALYSIS CATHETER  12/14/2012   Procedure: INSERTION OF DIALYSIS CATHETER;  Surgeon: Redell LITTIE Door, MD;  Location: [facility] OR;  Service: Vascular;;   INSERTION OF DIALYSIS CATHETER Right 01/07/2021   Procedure: INSERTION OF DIALYSIS CATHETER;  Surgeon: Harvey Carlin BRAVO, MD;  Location: [facility] OR;  Service: Vascular;  Laterality: Right;   INSERTION OF DIALYSIS CATHETER Left 01/08/2021   Procedure: INSERTION OF LEFT INTERNAL JUGULAR DIALYSIS CATHETER, tunneled;  Surgeon: Eliza Lonni RAMAN, MD;  Location: [facility] OR;  Service: Vascular;  Laterality: Left;   INSERTION OF DIALYSIS CATHETER Left 06/07/2024   Procedure: INSERTION OF DIALYSIS CATHETER;  Surgeon: Pearline Norman RAMAN, MD;  Location: [facility] OR;  Service: Vascular;  Laterality: Left;  TUNNELED DIALYSIS   IRRIGATION AND DEBRIDEMENT FOOT Right 05/25/2019   Procedure: INCISION AND DRAINAGE FOOT;  Surgeon: Gretel Ozell PARAS, DPM;  [facility]: [facility] OR;  Service: Podiatry;   Laterality: Right;   IRRIGATION AND DEBRIDEMENT FOOT Right 05/27/2019   Procedure: IRRIGATION AND DEBRIDEMENT FOOT;  Surgeon: Gretel Ozell PARAS, DPM;  Location: [facility];  Service: Podiatry;  Laterality: Right;   LASER PHOTO ABLATION Left 01/09/2023   Procedure: LASER PHOTO ABLATION;  Surgeon: Elner Arley LABOR, MD;  Location: [facility] OR;  Service: Ophthalmology;  Laterality: Left;   LEFT HEART CATH AND CORONARY ANGIOGRAPHY N/A 09/24/2020   Procedure: LEFT HEART CATH AND CORONARY ANGIOGRAPHY;  Surgeon: Swaziland, Peter M, MD;  Location: [facility] INVASIVE CV LAB;  Service: Cardiovascular;  Laterality: N/A;   LOWER EXTREMITY ANGIOGRAPHY Bilateral 05/17/2019   Procedure: LOWER EXTREMITY ANGIOGRAPHY;  Surgeon: Harvey Carlin BRAVO, MD;  [facility]: [facility] LAB;  Service: Cardiovascular;  Laterality: Bilateral;   PARS PLANA VITRECTOMY Left 01/09/2023   Procedure: PARS PLANA VITRECTOMY WITH 25 GAUGE AND REPAIR OF RETINAL DETACHMENT;  Surgeon: Elner Arley LABOR, MD;  Location: [facility] OR;  Service: Ophthalmology;  Laterality: Left;   REMOVAL OF A DIALYSIS CATHETER Right 01/08/2021   Procedure: REMOVAL OF TEMPORARY DIALYSIS CATHETER, LEFT FEMORAL VEIN;  Surgeon: Eliza Lonni RAMAN,  MD;  Location: [facility] OR;  Service: Vascular;  Laterality: Right;   REVISON OF ARTERIOVENOUS FISTULA Right 12/14/2012   Procedure: REVISON OF upper arm ARTERIOVENOUS FISTULA using 38mmx10cm gortex graft;  Surgeon: Redell LITTIE Door, MD;  Location: [facility] OR;  Service: Vascular;  Laterality: Right;   REVISON OF ARTERIOVENOUS FISTULA Right 12/27/2020   Procedure: RIGHT UPPER EXTREMITY ARTERIOVENOUS FISTULA REVISiON;  Surgeon: Magda Debby SAILOR, MD;  Location: [facility] OR;  Service: Vascular;  Laterality: Right;  PERIPHERAL NERVE BLOCK   RIGHT HEART CATH N/A 06/03/2024   Procedure: RIGHT HEART CATH;  Surgeon: Cherrie Toribio SAUNDERS, MD;  Location: [facility] INVAS[facility] CV LAB;  Service: Cardiovascular;  Laterality: N/A;   RIGHT/LEFT HEART CATH AND CORONARY ANGIOGRAPHY N/A 08/07/2020   Procedure:  RIGHT/LEFT HEART CATH AND CORONARY ANGIOGRAPHY;  Surgeon: Swaziland, Peter M, MD;  Location: [facility] INVASIVE CV LAB;  Service: Cardiovascular;  Laterality: N/A;   RIGHT/LEFT HEART CATH AND CORONARY ANGIOGRAPHY N/A 05/31/2024   Procedure: RIGHT/LEFT HEART CATH AND CORONARY ANGIOGRAPHY;  Surgeon: Elmira Newman PARAS, MD;  Location: [facility] INV[facility] CV LAB;  Service: Cardiovascular;  Laterality: N/A;   SOFT TISSUE MASS EXCISION     Right arm, Left leg  for MRSA infection   ULTRASOUND GUIDANCE FOR VASCULAR ACCESS Left 06/07/2024   Procedure: ULTRASOUND GUIDANCE, FOR VASCULAR ACCESS;  Surgeon: Pearline Norman RAMAN, MD;  Location: [facility] OR;  Service: Vascular;  Laterality: Left;   VENOUS ANGIOPLASTY  12/29/2023   Procedure: VENOUS ANGIOPLASTY;  Surgeon: Norine Manuelita LABOR, MD;  Location: [facility] INVASIVE CV LAB;  Service: Cardiovascular;;  Cephalic Arch; Intragraft; Distal Edge of Stent   VENOUS ANGIOPLASTY  03/17/2024   Procedure: VENOUS ANGIOPLASTY;  Surgeon: Gretta Lonni PARAS, MD;  Location: [facility] LAB;  Service: Cardiovascular;;   Patient Active Problem List   Diagnosis Date Noted   Acute on chronic systolic heart failure (HCC) 05/31/2024   Complication of AV dialysis fistula 05/31/2024   CAD (coronary artery disease) 05/31/2024   Nonrheumatic aortic valve stenosis 05/26/2024   Murmur 03/30/2024   ACS (acute coronary syndrome) (HCC) 09/12/2021   Prolonged QT interval 09/12/2021   Unstable angina (HCC) 08/20/2021   Nuclear sclerotic cataract of right eye 06/11/2021   Nuclear sclerotic cataract of left eye 06/11/2021   Posterior subcapsular age-related cataract, left eye 06/11/2021   Stable treated proliferative diabetic retinopathy of left eye determined by examination associated with type 2 diabetes mellitus (HCC) 06/11/2021   Right epiretinal membrane 06/11/2021   Stable treated proliferative diabetic retinopathy of right eye determined by examination associated with type 2 diabetes mellitus (HCC) 06/11/2021    Hyperkalemia 01/09/2021   ESRD (end stage renal disease) (HCC)    Dialysis AV fistula malfunction, initial encounter 01/07/2021   NSTEMI (non-ST elevated myocardial infarction) (HCC) 09/21/2020   Coronary artery disease involving native coronary artery of native heart without angina pectoris 08/09/2020   Pulmonary hypertension, unspecified (HCC) 08/09/2020   Pancytopenia (HCC) 08/06/2020   Allergy, unspecified, subsequent encounter 06/28/2020   Anaphylactic shock, unspecified, subsequent encounter 06/28/2020   Lymphedema of both lower extremities 12/29/2019   Hyperphosphatemia 12/11/2019   Encounter for post surgical wound check 08/16/2019   Post-op pain 08/16/2019   S/P transmetatarsal amputation of foot, right (HCC) 08/16/2019   Unspecified protein-calorie malnutrition 07/12/2019   PAD (peripheral artery disease) 06/30/2019   Diabetic retinopathy associated with type 2 diabetes mellitus (HCC) 06/23/2019   Other forms of retinal detachment(361.89) 06/23/2019   Secondary hyperparathyroidism 06/23/2019   Osteomyelitis, unspecified (HCC) 05/31/2019  Gangrene of toe of right foot (HCC)    Ulcer of right foot, with necrosis of bone (HCC)    Abscess of right foot    Diabetic infection of right foot (HCC)    Ulcer of right second toe with necrosis of bone (HCC)    Cellulitis of right foot 05/22/2019   Acute osteomyelitis of right foot (HCC) 05/15/2019   Osteomyelitis of great toe of right foot (HCC) 05/14/2019   Diabetic foot ulcer- right second toe 05/14/2019   Sepsis (HCC) 05/14/2019   Hypothyroidism, unspecified 01/11/2018   Hypercalcemia 06/14/2016   Dyslipidemia 05/11/2015   Hyperglycemia 05/05/2015   Diarrhea, unspecified 02/01/2015   Fever, unspecified 02/01/2015   Pain, unspecified 02/01/2015   Pruritus, unspecified 02/01/2015   Coagulation defect, unspecified 07/07/2014   Encounter for removal of sutures 05/30/2014   Preop cardiovascular exam 04/19/2014   Chest pain  08/23/2013   Type 2 diabetes mellitus (HCC) 08/23/2013   Hemodialysis status 08/23/2013   Chronic kidney disease, stage V (HCC) 08/23/2013   Obesity, unspecified 08/23/2013   Complication of vascular dialysis catheter 12/15/2012   Other fluid overload 12/07/2012   Other specified complication of vascular prosthetic devices, implants and grafts, subsequent encounter 08/06/2012   ESRD on dialysis (HCC) 07/18/2011   Pure hypercholesterolemia 06/23/2011   Iron  deficiency anemia, unspecified 06/21/2011   HYPOGLYCEMIA, UNSPECIFIED 11/13/2008   Disorder resulting from impaired renal function 07/31/2008   Ulcer of lower limb (HCC) 06/29/2008   Mixed hyperlipidemia 05/16/2008   MRSA INFECTION 10/18/2007   Essential hypertension 10/18/2007    ONSET DATE: 07/22/2024  REFERRING DIAG: Lt hand steal syndrome  THERAPY DIAG:  No diagnosis found.  Rationale for Evaluation and Treatment: {HABREHAB:27488}  SUBJECTIVE:   SUBJECTIVE STATEMENT: *** Pt accompanied by: {accompnied:27141}  PERTINENT HISTORY: ESRD - pt is on dialysis M/W/F  PRECAUTIONS: {Therapy precautions:24002}  WEIGHT BEARING RESTRICTIONS: {Yes ***/No:24003}  PAIN:  Are you having pain? {OPRCPAIN:27236}  FALLS: Has patient fallen in last 6 months? {fallsyesno:27318}  LIVING ENVIRONMENT: Lives with: {OPRC lives with:25569::lives with their family} Lives in: {Lives in:25570} Stairs: {opstairs:27293} Has following equipment at home: {Assistive devices:23999}  PLOF: {PLOF:24004}  PATIENT GOALS: ***  OBJECTIVE:  Note: Objective measures were completed at Evaluation unless otherwise noted.  HAND DOMINANCE: {MISC; OT HAND DOMINANCE:[id number]}  ADLs: Overall ADLs: *** Transfers/ambulation related to ADLs: Eating: *** Grooming: *** UB Dressing: *** LB Dressing: *** Toileting: *** Bathing: *** Tub Shower transfers: *** Equipment: {equipment:25573}  IADLs: Shopping: *** Light housekeeping: *** Meal Prep:  *** Community mobility: *** Medication management: *** Financial management: *** Handwriting: {OTWRITTENEXPRESSION:25361}  MOBILITY STATUS: {OTMOBILITY:25360}  POSTURE COMMENTS:  {posture:25561} Sitting balance: {sitting balance:25483}  ACTIVITY TOLERANCE: Activity tolerance: ***  FUNCTIONAL OUTCOME MEASURES: {OTFUNCTIONALMEASURES:27238}  UPPER EXTREMITY ROM:    {AROM/PROM:27142} ROM Right eval Left eval  Shoulder flexion    Shoulder abduction    Shoulder adduction    Shoulder extension    Shoulder internal rotation    Shoulder external rotation    Elbow flexion    Elbow extension    Wrist flexion    Wrist extension    Wrist ulnar deviation    Wrist radial deviation    Wrist pronation    Wrist supination    (Blank rows = not tested)  UPPER EXTREMITY MMT:     MMT Right eval Left eval  Shoulder flexion    Shoulder abduction    Shoulder adduction    Shoulder extension    Shoulder internal rotation  Shoulder external rotation    Middle trapezius    Lower trapezius    Elbow flexion    Elbow extension    Wrist flexion    Wrist extension    Wrist ulnar deviation    Wrist radial deviation    Wrist pronation    Wrist supination    (Blank rows = not tested)  HAND FUNCTION: {handfunction:27230}  COORDINATION: {otcoordination:27237}  SENSATION: {sensation:27233}  EDEMA: ***  MUSCLE TONE: {UETONE:25567}  COGNITION: Overall cognitive status: {cognition:24006}  VISION: Subjective report: *** Baseline vision: {OTBASELINEVISION:25363} Visual history: {OTVISUALHISTORY:25364}  VISION ASSESSMENT: {visionassessment:27231}  Patient has difficulty with following activities due to following visual impairments: ***  PERCEPTION: {Perception:25564}  PRAXIS: {Praxis:25565}  OBSERVATIONS: ***                                                                                                                             TREATMENT DATE: ***          PATIENT EDUCATION: Education details: *** Person educated: {Person educated:25204} Education method: {Education Method:25205} Education comprehension: {Education Comprehension:25206}  HOME EXERCISE PROGRAM: ***   GOALS: Goals reviewed with patient? {yes/no:20286}  SHORT TERM GOALS: Target date: ***  *** Baseline: Goal status: INITIAL  2.  *** Baseline:  Goal status: INITIAL  3.  *** Baseline:  Goal status: INITIAL  4.  *** Baseline:  Goal status: INITIAL  5.  *** Baseline:  Goal status: INITIAL  6.  *** Baseline:  Goal status: INITIAL  LONG TERM GOALS: Target date: ***  *** Baseline:  Goal status: INITIAL  2.  *** Baseline:  Goal status: INITIAL  3.  *** Baseline:  Goal status: INITIAL  4.  *** Baseline:  Goal status: INITIAL  5.  *** Baseline:  Goal status: INITIAL  6.  *** Baseline:  Goal status: INITIAL  ASSESSMENT:  CLINICAL IMPRESSION: Patient is a *** y.o. *** who was seen today for occupational therapy evaluation for ***.   PERFORMANCE DEFICITS: in functional skills including {OT physical skills:25468}, cognitive skills including {OT cognitive skills:25469}, and psychosocial skills including {OT psychosocial skills:25470}.   IMPAIRMENTS: are limiting patient from {OT performance deficits:25471}.   CO-MORBIDITIES: {Comorbidities:25485} that affects occupational performance. Patient will benefit from skilled OT to address above impairments and improve overall function.  MODIFICATION OR ASSISTANCE TO COMPLETE EVALUATION: {OT modification:25474}  OT OCCUPATIONAL PROFILE AND HISTORY: {OT PROFILE AND HISTORY:25484}  CLINICAL DECISION MAKING: {OT CDM:25475}  REHAB POTENTIAL: {rehabpotential:25112}  EVALUATION COMPLEXITY: {Evaluation complexity:25115}    PLAN:  OT FREQUENCY: {rehab frequency:25116}  OT DURATION: {rehab duration:25117}  PLANNED INTERVENTIONS: {OT Interventions:25467}  RECOMMENDED OTHER  SERVICES: ***  CONSULTED AND AGREED WITH PLAN OF CARE: {ENR:74513}  PLAN FOR NEXT SESSION: ***   Burnard JINNY Roads, OT 07/28/2024, 12:14 PM

## 2024-08-02 ENCOUNTER — Ambulatory Visit: Admitting: Occupational Therapy

## 2024-08-09 ENCOUNTER — Other Ambulatory Visit: Payer: Self-pay | Admitting: General Surgery

## 2024-08-09 DIAGNOSIS — L089 Local infection of the skin and subcutaneous tissue, unspecified: Secondary | ICD-10-CM

## 2024-08-10 ENCOUNTER — Inpatient Hospital Stay: Admission: RE | Admit: 2024-08-10 | Discharge: 2024-08-10 | Attending: General Surgery

## 2024-08-10 DIAGNOSIS — L089 Local infection of the skin and subcutaneous tissue, unspecified: Secondary | ICD-10-CM

## 2024-08-11 ENCOUNTER — Ambulatory Visit: Admitting: Occupational Therapy

## 2024-08-26 ENCOUNTER — Encounter: Payer: Self-pay | Admitting: Cardiology

## 2024-08-26 ENCOUNTER — Ambulatory Visit: Attending: Cardiology | Admitting: Cardiology

## 2024-08-26 VITALS — BP 116/69 | HR 74 | Ht 70.0 in | Wt 156.0 lb

## 2024-08-26 DIAGNOSIS — E782 Mixed hyperlipidemia: Secondary | ICD-10-CM | POA: Diagnosis present

## 2024-08-26 DIAGNOSIS — I5020 Unspecified systolic (congestive) heart failure: Secondary | ICD-10-CM | POA: Insufficient documentation

## 2024-08-26 DIAGNOSIS — I25118 Atherosclerotic heart disease of native coronary artery with other forms of angina pectoris: Secondary | ICD-10-CM | POA: Insufficient documentation

## 2024-08-26 LAB — LIPID PANEL
Chol/HDL Ratio: 1.8 ratio (ref 0.0–5.0)
Cholesterol, Total: 158 mg/dL (ref 100–199)
HDL: 90 mg/dL (ref >39)
LDL Chol Calc (NIH): 53 mg/dL (ref 0–99)
Triglycerides: 77 mg/dL (ref 0–149)
VLDL Cholesterol Cal: 15 mg/dL (ref 5–40)

## 2024-08-26 MED ORDER — LOSARTAN POTASSIUM 25 MG PO TABS: 12.5000 mg | ORAL_TABLET | Freq: Every day | ORAL | 3 refills | Status: DC

## 2024-08-26 NOTE — Progress Notes (Signed)
 Cardiology Office Note:  .   Date:  08/26/2024  ID:  [name], DOB [birth year], MRN [id number] PCP: Shelda Atlas, MD  [facility] Providers Cardiologist:  Newman Lawrence, MD PCP: Shelda Atlas, MD  Chief Complaint  Patient presents with   HFrEF     [name] is a 58 y.o. male with hypertension, hyperlipidemia, diabetes, CAD, HFrEF, PAD  s/p left BKA & right transmetatarsal amputation, ESRD on HD  History of Present Illness  Patient is doing well without any significant chest pain or more than usual exertional dyspnea symptoms.  He was hospitalized in 05/2024 after left and right heart catheterization for optimization of his heart failure.  He saw heart failure clinic since then.  There was some question of Entresto  causing angioedema, and therefore was discontinued.  At this time, he does not know GDMT for HFrEF.  He underwent fistula surgery, but is going to undergo repeat procedure due to concerns with the flow.  He 1 hernia surgery, another hernia surgery is awaited.  He did not have any perioperative cardiac events.  He is compliant with his medical therapy, including Repatha .    Vitals:   08/26/24 1015  BP: 116/69  Pulse: 74  SpO2: 100%        Review of Systems  Cardiovascular:  Positive for dyspnea on exertion. Negative for chest pain, leg swelling, palpitations and syncope.        Studies Reviewed: SABRA        EKG 03/30/2024: Normal sinus rhythm Left ventricular hypertrophy with repolarization abnormality ( Cornell product ) When compared with ECG of 09-Jan-2023 13:06, Questionable change in initial forces of Lateral leads  Coronary angiography 05/31/2024: LM: No significant disease LAD: Occluded mid LAD stent        Faint collaterals filling distal LAD        Diag 2 with 80% stenosis Ramus: Small caliber vessel, prox 90% stenosis Lcx: Prox 70%, mid 80% stenoses. Occluded OM 3 stent RCA: Large, dominant vessel          Mid focal 60%  stenosis          LVEDP 22 mmHg   Right heart catheterization 05/31/2024: RA: 22 mmHg RV: 105/10 mmHg PA: 109/56 mmHg, mPAP 81 mmHg PCW: 35 mmHg   AO sats: 96% PA sats: 41%   Fick: CO: 3.1 L/min CI: 1.6 L/min/m2   Thermodilution: CO: 3.0 L/min CI: 1.5 L/min/m2      Conclusion: Severe multivessel CAD with occluded prior stents in the LAD and left circumflex Territory supplied by these vessels is largely infarcted based on stress test findings.  Therefore, I do not think he will benefit from revascularization to these vessels. 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 Left ventricular ejection fraction, by estimation, is 30 to 35%. Left  ventricular ejection fraction by 3D volume is 30 %. The left ventricle has  moderately decreased function. The left ventricle demonstrates global  hypokinesis. Left ventricular diastolic function could not be evaluated. The average left ventricular global longitudinal strain is -11.2 %.  The global longitudinal strain is abnormal.   2. Right ventricular systolic function is normal. The right ventricular  size is mildly enlarged.   3. Left atrial size was moderately dilated.   4. A small pericardial effusion is present. The pericardial effusion is  circumferential.   5. The mitral valve is normal in structure. Mild to moderate mitral valve  regurgitation. No evidence of mitral stenosis. Moderate to severe mitral  annular calcification.   6. Tricuspid valve regurgitation is severe.   7. The aortic valve is calcified. There is moderate calcification of the  aortic valve. There is moderate thickening of the aortic valve. Aortic  valve regurgitation is not visualized. Low flow low gradient moderate to  severe. Aortic valve area, by VTI measures 0.96 cm. Aortic valve  mean gradient measures 9.8 mmHg. Aortic valve Vmax measures 2.14 m/s, SVI 22, DI 0.34, Indexed AVA  0.51 cm2/m2.   8. The inferior vena cava is normal in size with greater than 50%  respiratory variability, suggesting right atrial pressure of 3 mmHg.   Stress test 04/2024:   Findings are consistent with infarction with peri-infarct ischemia. The study is high risk.   No ST deviation was noted. The ECG was not diagnostic due to pharmacologic protocol.   LV perfusion is abnormal. There is no evidence of ischemia. There is evidence of infarction. Defect 1: There is a large defect with severe reduction in uptake present in the apical to basal inferior, inferolateral and apex location(s) that is fixed. There is abnormal wall motion in the defect area. Consistent with infarction and peri-infarct ischemia.   Left ventricular function is abnormal. Global function is moderately reduced. There was a single regional abnormality. Nuclear stress EF: 32%. The left ventricular ejection fraction is moderately decreased (30-44%). End diastolic cavity size is severely enlarged. End systolic cavity size is severely enlarged.   Prior study available for comparison from 09/09/2013.   ECG rhythm shows normal sinus rhythm.   Large size, severe severity fixed basal to apical inferior, inferolateral and apical perfusion defect, suggestive of LCX territory scar without significant peri-infarct ischemia. LVEF 32% with inferolateral and apical akinesis. This is high risk study (due to LVEF <35% and large territory of scar). Compared to a prior study in 2014, the LVEF is lower and new regional perfusion defects are noted. This correlates with inferior and inferolateral akinesis on recent echo (my read).   Labs 7-07/2024: Chol 205, TG 60, HDL 83, LDL 111 Hb 11.1   Labs 04/2023: Chol 156, TG 114, HDL 78, LDL 58 HbA1C 6.6% Hb 10.6 Cr 10.4 TSH 3.5    Physical Exam Vitals and nursing note reviewed.  Constitutional:      General: He is not in acute distress. Neck:     Vascular: No JVD.  Cardiovascular:     Rate  and Rhythm: Normal rate and regular rhythm.     Heart sounds: Normal heart sounds. No murmur heard. Pulmonary:     Effort: Pulmonary effort is normal.     Breath sounds: Normal breath sounds. No wheezing or rales.  Musculoskeletal:     Right lower leg: Edema (Trace) present.     Comments: Left BKA, right transmetatarsal amputation      VISIT DIAGNOSES: No diagnosis found.     [name] is a 58 y.o. male with hypertension, hyperlipidemia, diabetes, CAD, HFrEF, PAD  s/p left BKA & right transmetatarsal potation, ESRD on HD Assessment & Plan  HFrEF: Ischemic cardiomyopathy, with no ischemia on stress tests. Severe multivessel CAD with no angina symptoms. Surprisingly, he is now on no GDMT for HFrEF. Entresto  was discontinued with questionable concern for angioedema during hospitalization in 05/2024. Also, there was concern about low blood pressures during dialysis. I have started back on losartan 12.5 mg daily. Recommend follow-up with heart failure clinic, with probable follow-up with Pharm.D. outpatient for  uptitration of GDMT. If EF remains low, he may need ICD in future.  CAD:  Severe multivessel CAD with no anginal symptoms, prior infarct with no ischemia on stress testing. Continue medical therapy.   Continue aspirin , Repatha . Check lipid panel.   Will add beta-blocker in future concurrent with uptitration of GDMT for HFrEF.  PAD: Continue Aspirin  See above regarding lipid lowering therapy.   Aortic stenosis: I do not think this is severe low-flow low gradient aortic stenosis. Will repeat echocardiogram in 04/2025, will order at next visit.  Preoperative risk stratification: Fairly compensated at this time from a CAD and HFrEF standpoint. Okay to proceed with upcoming hernia and fistula surgery in the next 6 few months.  Meds ordered this encounter  Medications   losartan (COZAAR) 25 MG tablet    Sig: Take 0.5 tablets (12.5 mg total) by mouth daily.     Dispense:  45 tablet    Refill:  3     F/u in 3 months   Signed, [name], MD

## 2024-08-26 NOTE — Patient Instructions (Addendum)
 Medication Instructions:  Your physician has recommended you make the following change in your medication:  START: Losartan 12.5 mg once daily  *If you need a refill on your cardiac medications before your next appointment, please call your pharmacy*  Lab Work: Lipids  If you have labs (blood work) drawn today and your tests are completely normal, you will receive your results only by: MyChart Message (if you have MyChart) OR A paper copy in the mail If you have any lab test that is abnormal or we need to change your treatment, we will call you to review the results.  Follow-Up: At [facility], you and your health needs are our priority.  As part of our continuing mission to provide you with exceptional heart care, our providers are all part of one team.  This team includes your primary Cardiologist (physician) and Advanced Practice Providers or APPs (Physician Assistants and Nurse Practitioners) who all work together to provide you with the care you need, when you need it.  Your next appointment:   6 month(s)  Provider:   [name], MD    Please set up a follow up appointment with Heart failure clinic soon.

## 2024-08-29 ENCOUNTER — Ambulatory Visit: Payer: Self-pay | Admitting: Cardiology

## 2024-08-29 ENCOUNTER — Ambulatory Visit (HOSPITAL_COMMUNITY)
Admission: RE | Admit: 2024-08-29 | Discharge: 2024-08-29 | Disposition: A | Discharge status: Home / Self Care | Attending: Vascular Surgery | Admitting: Vascular Surgery

## 2024-08-29 ENCOUNTER — Other Ambulatory Visit: Payer: Self-pay

## 2024-08-29 ENCOUNTER — Encounter (HOSPITAL_COMMUNITY)
Admission: RE | Disposition: A | Discharge status: Home / Self Care | Payer: Self-pay | Source: Home / Self Care | Attending: Vascular Surgery

## 2024-08-29 DIAGNOSIS — N186 End stage renal disease: Secondary | ICD-10-CM | POA: Insufficient documentation

## 2024-08-29 DIAGNOSIS — I700 Atherosclerosis of aorta: Secondary | ICD-10-CM | POA: Diagnosis not present

## 2024-08-29 DIAGNOSIS — Z992 Dependence on renal dialysis: Secondary | ICD-10-CM | POA: Diagnosis not present

## 2024-08-29 DIAGNOSIS — T82898A Other specified complication of vascular prosthetic devices, implants and grafts, initial encounter: Secondary | ICD-10-CM

## 2024-08-29 HISTORY — PX: UPPER EXTREMITY INTERVENTION: CATH118271

## 2024-08-29 LAB — POCT I-STAT, CHEM 8
BUN: 43 mg/dL — ABNORMAL HIGH (ref 6–20)
Calcium, Ion: 1.27 mmol/L (ref 1.15–1.40)
Chloride: 101 mmol/L (ref 98–111)
Creatinine, Ser: 7.1 mg/dL — ABNORMAL HIGH (ref 0.61–1.24)
Glucose, Bld: 134 mg/dL — ABNORMAL HIGH (ref 70–99)
HCT: 35 % — ABNORMAL LOW (ref 39.0–52.0)
Hemoglobin: 11.9 g/dL — ABNORMAL LOW (ref 13.0–17.0)
Potassium: 4.6 mmol/L (ref 3.5–5.1)
Sodium: 138 mmol/L (ref 135–145)
TCO2: 23 mmol/L (ref 22–32)

## 2024-08-29 LAB — GLUCOSE, CAPILLARY: Glucose-Capillary: 124 mg/dL — ABNORMAL HIGH (ref 70–99)

## 2024-08-29 SURGERY — UPPER EXTREMITY INTERVENTION
Anesthesia: LOCAL | Laterality: Left

## 2024-08-29 MED ORDER — SODIUM CHLORIDE 0.9% FLUSH: 3.0000 mL | Freq: Two times a day (BID) | INTRAVENOUS | Status: DC

## 2024-08-29 MED ORDER — HEPARIN SODIUM (PORCINE) 1000 UNIT/ML IJ SOLN
INTRAMUSCULAR | Status: AC
  Filled 2024-08-29: qty 10

## 2024-08-29 MED ORDER — MIDAZOLAM HCL 2 MG/2ML IJ SOLN
INTRAMUSCULAR | Status: AC
  Filled 2024-08-29: qty 2

## 2024-08-29 MED ORDER — FENTANYL CITRATE (PF) 100 MCG/2ML IJ SOLN
INTRAMUSCULAR | Status: AC
  Filled 2024-08-29: qty 2

## 2024-08-29 MED ORDER — MIDAZOLAM HCL (PF) 2 MG/2ML IJ SOLN
INTRAMUSCULAR | Status: DC | PRN
Start: 2024-08-29 — End: 2024-08-29
  Administered 2024-08-29: 1 mg via INTRAVENOUS

## 2024-08-29 MED ORDER — ACETAMINOPHEN 325 MG PO TABS: 650.0000 mg | ORAL_TABLET | ORAL | Status: DC | PRN

## 2024-08-29 MED ORDER — LABETALOL HCL 5 MG/ML IV SOLN: 10.0000 mg | INTRAVENOUS | Status: DC | PRN

## 2024-08-29 MED ORDER — DIPHENHYDRAMINE HCL 50 MG/ML IJ SOLN
INTRAMUSCULAR | Status: DC | PRN
Start: 2024-08-29 — End: 2024-08-29
  Administered 2024-08-29 (×2): 25 mg via INTRAVENOUS

## 2024-08-29 MED ORDER — DIPHENHYDRAMINE HCL 50 MG/ML IJ SOLN
INTRAMUSCULAR | Status: AC
  Filled 2024-08-29: qty 1

## 2024-08-29 MED ORDER — SODIUM CHLORIDE 0.9% FLUSH: 3.0000 mL | INTRAVENOUS | Status: DC | PRN

## 2024-08-29 MED ORDER — FENTANYL CITRATE (PF) 100 MCG/2ML IJ SOLN
INTRAMUSCULAR | Status: DC | PRN
  Administered 2024-08-29: 50 ug via INTRAVENOUS

## 2024-08-29 MED ORDER — HYDRALAZINE HCL 20 MG/ML IJ SOLN: 5.0000 mg | INTRAMUSCULAR | Status: DC | PRN

## 2024-08-29 MED ORDER — DIPHENHYDRAMINE HCL 50 MG PO CAPS
50.0000 mg | ORAL_CAPSULE | ORAL | Status: DC | PRN
  Administered 2024-08-29: 50 mg via ORAL
  Filled 2024-08-29: qty 2
  Filled 2024-08-29: qty 1

## 2024-08-29 MED ORDER — SODIUM CHLORIDE 0.9 % IV SOLN: 250.0000 mL | INTRAVENOUS | Status: DC | PRN

## 2024-08-29 MED ORDER — HEPARIN (PORCINE) IN NACL 1000-0.9 UT/500ML-% IV SOLN
INTRAVENOUS | Status: DC | PRN
Start: 2024-08-29 — End: 2024-08-29
  Administered 2024-08-29 (×2): 500 mL

## 2024-08-29 MED ORDER — HEPARIN SODIUM (PORCINE) 1000 UNIT/ML IJ SOLN
INTRAMUSCULAR | Status: DC | PRN
Start: 2024-08-29 — End: 2024-08-29
  Administered 2024-08-29: 6000 [IU] via INTRAVENOUS

## 2024-08-29 MED ORDER — IODIXANOL 320 MG/ML IV SOLN
INTRAVENOUS | Status: DC | PRN
Start: 2024-08-29 — End: 2024-08-29
  Administered 2024-08-29: 55 mL

## 2024-08-29 MED ORDER — LIDOCAINE HCL (PF) 1 % IJ SOLN
INTRAMUSCULAR | Status: DC | PRN
  Administered 2024-08-29: 10 mL via INTRADERMAL

## 2024-08-29 MED ORDER — LIDOCAINE HCL (PF) 1 % IJ SOLN
INTRAMUSCULAR | Status: AC
  Filled 2024-08-29: qty 30

## 2024-08-29 SURGICAL SUPPLY — 12 items
CATH ANGIO 5F BER2 100CM (CATHETERS) IMPLANT
CATH ANGIO 5F PIGTAIL 100CM (CATHETERS) IMPLANT
CLOSURE MYNX CONTROL 5F (Vascular Products) IMPLANT
GUIDEWIRE ANGLED .035 180CM (WIRE) IMPLANT
KIT MICROPUNCTURE NIT STIFF (SHEATH) IMPLANT
KIT SINGLE USE MANIFOLD (KITS) IMPLANT
KIT SYRINGE INJ CVI SPIKEX1 (MISCELLANEOUS) IMPLANT
PACK CARDIAC CATHETERIZATION (CUSTOM PROCEDURE TRAY) IMPLANT
SET ATX-X65L (MISCELLANEOUS) IMPLANT
SHEATH PINNACLE 5F 10CM (SHEATH) IMPLANT
SHEATH PROBE COVER 6X72 (BAG) IMPLANT
WIRE BENTSON .035X145CM (WIRE) IMPLANT

## 2024-08-29 NOTE — H&P (Signed)
 [name] seen and examined.  No complaints. No changes to medication history or physical exam since last seen. After discussing the risks and benefits of LUE Angiogram, [name] elected to proceed.   [name] Serve MD      POST OPERATIVE OFFICE NOTE       CC:  F/u for surgery   HPI:  This is a 58 y.o. male who is s/p LUA AVG and TDC on 06/07/2024 by Dr. Serve.      [name] has a right brachiocephalic placed in 2012 by Dr. Laurence.  This has undergone revision with previous interposition.  He had multiple fistulogram's as recently as 03/17/2024 with a stenosis adjacent to the stent in the cephalic arch.    He was seen on 06/10/2024 for concerns for steal.  Dr. Serve did not feel that his sx were due to steal as he had a strong doppler signal at the radial and palmar arch.     [name] states he does have pain/numbness in the left hand.    He states it was just around the pinky finger and ring finger but now he has it around the thumb and first finger.  He states that the middle finger is not really affected.  He uses vibration on the palm and it seems to help.  He has rubbed the pinky finger due to the numbness and now has a tiny ulceration present.  This has not gotten any worse but is not getting any better.  He does not have any clumsiness with the left hand.  He is right hand dominant.  He states that this may be a little better than when he last saw Dr. Serve.    The [name] is on dialysis M/W/F at [facility] location.     Allergies       Allergies  Allergen Reactions   Crestor [Rosuvastatin]        Other reaction(s): Muscle Pain   Fentanyl  Itching      [name] received Fentanyl  during procedure and had reaction, medicinal intervention applied.    Lipitor [Atorvastatin ]        Other reaction(s): Muscle Pain   Amoxicillin Itching      Did it involve swelling of the face/tongue/throat, SOB, or low BP? Unknown Did it involve sudden or severe rash/hives, skin peeling, or any  reaction on the inside of your mouth or nose? Unknown Did you need to seek medical attention at a hospital or doctor's office? Unknown When did it last happen?      20 years ago If all above answers are NO, may proceed with cephalosporin use.     Cefazolin  Other (See Comments) and Swelling      LIP SWELLING   Chlorhexidine  Hives      [name] has blistering   Hydromorphone  Itching      [name] states he may take with benadryl . Makes feet itch.   Oxycodone  Rash and Other (See Comments)   Percocet [Oxycodone -Acetaminophen ] Itching and Other (See Comments)      Legs only. Itching and burning feeling. Can tolerate with hydroxyzine     Morphine         Other reaction(s): rash/itching All narcotics per [name]   Gabapentin Nausea And Vomiting      Stomach issues               Current Outpatient Medications  Medication Sig Dispense Refill   aspirin  EC 81 MG tablet Take 1 tablet (81 mg total) by mouth daily.  90 tablet 3   Continuous Blood Gluc Sensor (FREESTYLE LIBRE 14 DAY SENSOR) MISC See admin instructions.       EPINEPHrine  (EPIPEN  2-PAK) 0.3 mg/0.3 mL IJ SOAJ injection Inject 0.3 mg into the muscle as needed for anaphylaxis. 2 each 0   Evolocumab  (REPATHA  SURECLICK) 140 MG/ML SOAJ Inject 140 mg into the skin every 14 (fourteen) days. 6 mL 3   multivitamin (RENA-VIT) TABS tablet Take 1 tablet by mouth daily with lunch.       nitroGLYCERIN  (NITROSTAT ) 0.4 MG SL tablet Place 0.4 mg under the tongue every 5 (five) minutes as needed for chest pain.       prednisoLONE  acetate (PRED FORTE ) 1 % ophthalmic suspension Place 1 drop into the right eye daily. ([name] not taking: Reported on 07/14/2024)       sevelamer  carbonate (RENVELA ) 800 MG tablet Take 1,600-2,400 mg by mouth 3 (three) times daily with meals.       sodium zirconium cyclosilicate  (LOKELMA ) 10 g PACK packet Take 10 g by mouth every Tuesday, Thursday, Saturday, and Sunday.       traZODone  (DESYREL ) 150 MG tablet Take 150 mg by mouth at  bedtime as needed for sleep.          No current facility-administered medications for this visit.         ROS:  See HPI   Physical Exam:      Today's Vitals    07/22/24 0956  BP: (!) 144/80  Pulse: 80  Temp: 97.9 F (36.6 C)  SpO2: 99%  Weight: 158 lb 4.6 oz (71.8 kg)    Body mass index is 22.71 kg/m.     Incision:  well healed Extremities:   He does have radial and doppler flow that augments with compression of the graft.  He has a faint palmar arch signal that slightly augments with compression of the graft.  He has strong hand grip and motor in tact.   Access is  easily palpable and has an excellent thrill.       Assessment/Plan:  This is a 58 y.o. male who is s/p: LUA AVG and TDC on 06/07/2024 by Dr. Pearline.     -[name] continues to have pain and numbness in the left hand.  It is still significant but maybe slightly better.  His motor is in tact.  Discussed possibility of needing to ligate the graft.  After much discussion, [name] would like to wait and see if his hand continues to improve.  He is having hernia surgery on Tuesday.  Discussed with Dr. Pearline and will set him up for LUE angiogram via the groin in 3-4 weeks after he has recovered from his hernia surgery.   -discussed with him that if the tiny ulcer on his pinky finger worsens or his symptoms worsen, he will call us  sooner.  He and his wife expressed good understanding.  -will refer him for occupational therapy for his left hand symptoms.      [name], [facility] [phone]

## 2024-08-29 NOTE — Progress Notes (Signed)
 Discharge instructions reviewed with patient and wife at bedside. Denies questions concerns. PT tolerated PO intake. Ambulated in the hallway. Incision site remains clean dry and intact. No s/s of complications. PT escorted from the unit via wheel chair to personal vehicle.

## 2024-08-29 NOTE — Op Note (Signed)
    Patient name: [name] MRN: [id number] DOB: 1966-02-19 Sex: male  08/29/2024 Pre-operative Diagnosis: Left arm steal with AVG Post-operative diagnosis:  Same Surgeon:  Norman GORMAN Serve, MD Procedure Performed:  Ultrasound-guided access of left common femoral artery Arch aortogram and left upper extremity angiogram Second-order cannulation of left axillary artery 15 minutes of moderate sedation with fentanyl  and Versed   Indications: [name] is a 58 year old male with ESRD on HD.  In August he underwent left arm AVG creation and TDC placement.  He continues to dialyze through the [facility] as he has been having hand pain, numbness and has a small ulcer on the left pinky.  This presumed to be due to steal.  Risks and benefits of angiogram to assess for arterial lesion were reviewed and he elected to proceed.  Findings:  Mild to moderate disease of the aortic arch.  Patent subclavian axillary and brachial arteries.  The radial and ulnar arteries are patent with the radial being dominant.  There is significantly increased flow with the graft being compressed versus open.   Procedure:  The patient was identified in the holding area and taken to the cath lab  The patient was then placed supine on the table and prepped and draped in the usual sterile fashion.  A time out was called.  Ultrasound was used to evaluate the left common femoral artery.  It was patent .  A digital ultrasound image was acquired.  A micropuncture needle was used to access the left common femoral artery under ultrasound guidance.  An 018 wire was advanced without resistance and a micropuncture sheath was placed.  The 018 wire was removed and a benson wire was placed.  The micropuncture sheath was exchanged for a 5 french sheath.  The patient was then systemically heparinized and pigtail catheter was placed into the ascending aorta over the Bentson wire.  An arch aortogram was then obtained.  Using a peritoneal catheter and a soft  angled Glidewire I then selected the left subclavian.  The wire was passed down into the left arm and the catheter was parked in the left axillary artery.  Left upper extremity angiogram was then obtained which demonstrated the above findings.  The wire and catheter were removed.  A Mynx closure device was deployed in the left common femoral access for hemostasis.  Contrast: 55 cc Sedation: 15 minutes  Impression: Radiographic steal with significant increased flow into the hand with the graft compressed versus open.   Norman GORMAN Serve MD [facility] [place] Office: [phone]

## 2024-08-29 NOTE — Progress Notes (Signed)
 Patient and wife was given discharge instructions. Both verbalized understanding.

## 2024-08-31 ENCOUNTER — Encounter (HOSPITAL_COMMUNITY): Payer: Self-pay | Admitting: Vascular Surgery

## 2024-09-09 ENCOUNTER — Ambulatory Visit (HOSPITAL_COMMUNITY)
Admission: RE | Admit: 2024-09-09 | Discharge: 2024-09-09 | Disposition: A | Discharge status: Home / Self Care | Source: Ambulatory Visit | Attending: Cardiology | Admitting: Cardiology

## 2024-09-09 ENCOUNTER — Encounter (HOSPITAL_COMMUNITY): Payer: Self-pay | Admitting: Cardiology

## 2024-09-09 VITALS — BP 118/74 | HR 80 | Ht 70.0 in | Wt 167.8 lb

## 2024-09-09 DIAGNOSIS — N186 End stage renal disease: Secondary | ICD-10-CM

## 2024-09-09 DIAGNOSIS — E1122 Type 2 diabetes mellitus with diabetic chronic kidney disease: Secondary | ICD-10-CM | POA: Diagnosis not present

## 2024-09-09 DIAGNOSIS — I081 Rheumatic disorders of both mitral and tricuspid valves: Secondary | ICD-10-CM | POA: Diagnosis not present

## 2024-09-09 DIAGNOSIS — I25118 Atherosclerotic heart disease of native coronary artery with other forms of angina pectoris: Secondary | ICD-10-CM

## 2024-09-09 DIAGNOSIS — Z7982 Long term (current) use of aspirin: Secondary | ICD-10-CM | POA: Insufficient documentation

## 2024-09-09 DIAGNOSIS — I5022 Chronic systolic (congestive) heart failure: Secondary | ICD-10-CM | POA: Diagnosis present

## 2024-09-09 DIAGNOSIS — I350 Nonrheumatic aortic (valve) stenosis: Secondary | ICD-10-CM

## 2024-09-09 DIAGNOSIS — Z955 Presence of coronary angioplasty implant and graft: Secondary | ICD-10-CM | POA: Insufficient documentation

## 2024-09-09 DIAGNOSIS — I255 Ischemic cardiomyopathy: Secondary | ICD-10-CM | POA: Diagnosis not present

## 2024-09-09 DIAGNOSIS — I132 Hypertensive heart and chronic kidney disease with heart failure and with stage 5 chronic kidney disease, or end stage renal disease: Secondary | ICD-10-CM | POA: Insufficient documentation

## 2024-09-09 DIAGNOSIS — I2510 Atherosclerotic heart disease of native coronary artery without angina pectoris: Secondary | ICD-10-CM | POA: Insufficient documentation

## 2024-09-09 DIAGNOSIS — E1151 Type 2 diabetes mellitus with diabetic peripheral angiopathy without gangrene: Secondary | ICD-10-CM | POA: Insufficient documentation

## 2024-09-09 DIAGNOSIS — I272 Pulmonary hypertension, unspecified: Secondary | ICD-10-CM | POA: Diagnosis not present

## 2024-09-09 DIAGNOSIS — I5020 Unspecified systolic (congestive) heart failure: Secondary | ICD-10-CM

## 2024-09-09 NOTE — Patient Instructions (Signed)
 Medication Changes:  STOP LOSARTAN    Follow-Up in: 4 MONTHS WITH APP CLINIC AS SCHEDULED   At the [facility], you and your health needs are our priority. We have a designated team specialized in the treatment of Heart Failure. This Care Team includes your primary Heart Failure Specialized Cardiologist (physician), Advanced Practice Providers (APPs- Physician Assistants and Nurse Practitioners), and Pharmacist who all work together to provide you with the care you need, when you need it.   You may see any of the following providers on your designated Care Team at your next follow up:  Dr. Toribio Fuel Dr. Ezra Shuck Dr. Odis Brownie Greig Mosses, NP Caffie Shed, [place] [facility] Aristes, [place] Beckey Coe, NP Jordan Lee, NP Tinnie Redman, PharmD   Please be sure to bring in all your medications bottles to every appointment.   Need to Contact Us :  If you have any questions or concerns before your next appointment please send us  a message through [name] or call our office at [phone].    TO LEAVE A MESSAGE FOR THE NURSE SELECT OPTION 2, PLEASE LEAVE A MESSAGE INCLUDING: YOUR NAME DATE OF BIRTH CALL BACK NUMBER REASON FOR CALL**this is important as we prioritize the call backs  YOU WILL RECEIVE A CALL BACK THE SAME DAY AS LONG AS YOU CALL BEFORE 4:00 PM

## 2024-09-11 NOTE — Progress Notes (Signed)
 ADVANCED HEART FAILURE FOLLOW UP CLINIC NOTE  Referring Physician: Shelda Atlas, MD  Primary Care: Shelda Atlas, MD Primary Cardiologist:  HPI: [name] is a 58 y.o. male who presents for follow up of chronic systolic heart failure and pulmonary hypertension.      Patient with a complicated PMH of HFrEF, ICM, HTN, HLD, DM, CAD, PAD, s/p left BKA transmetatarsal, ESRD 3 times a week. 2021 PCI to D2, PCI to mid LAD, and PCI to rPDA with drug eluting stents. 2022 in-stent stenosis s/p PCI to the LAD.   Previously seen by Cardiology at [facility]. In 2023 EF 40% and normal RV.    Saw Dr Elmira 8/7 or pre op clearance for upcoming surgeries. He reported chest pain and exertional dyspnea.  Recent echocardiogram shows severely reduced LVEF 30-35%  moderate to severe TR, mild to moderate MR, with stress test largely showing infarct without any significant ischemia. He was set up for cath   Admitted 8/25 with A/C HFrEF with low output. Initially with heart cath that showed severe multivessel disease with occluded prior stents in the LAD and LCx and severely elevated pressures. Significantly improved with iHD. RHC post several rounds of iHD and better BP control  with significantly improved pressures. GDMT limited with ESRD, tolerated Entresto  for BP control. VVS added TDC and L AV graft prior to discharge.        SUBJECTIVE:  Patient reports that overall he is doing well.  He is not significantly limited by shortness of breath and is able to make it to and from dialysis without issue.  He is tolerating dialysis well and has not had to finish any sessions early.  He has not yet started the losartan  due to concerns about it dropping his blood pressure, reports that when he takes pain meds his blood pressure drops into the 80s systolic.   PMH, current medications, allergies, social history, and family history reviewed in epic.  PHYSICAL EXAM: Vitals:   09/09/24 1210  BP: 118/74  Pulse:  80  SpO2: 99%   GENERAL: Well nourished and in no apparent distress at rest.  PULM:  Normal work of breathing, clear to auscultation bilaterally. Respirations are unlabored.  CARDIAC:  JVP: flat         Normal rate with regular rhythm.  Systolic flow murmur from fistula, aortic stenosis murmur.  No edema. Warm and well perfused extremities. ABDOMEN: Soft, non-tender, non-distended. NEUROLOGIC: Patient is oriented x3 with no focal or lateralizing neurologic deficits.    L/RHC 05/31/24: Multivessel disease. RA 22, PA 109/56, PCWP 35, SVR 2500, Pa Sat 40%, CO 3, CI 1.7.  RHC 06/03/24 post dialysis RA 1, PA 43/12 (25), PCW 7, Fick CO/CI 4.6/2.4, TD CO/CI 4.7/2.4, PAPi 31. Pressures significantly improved.   ASSESSMENT & PLAN:  Chronic HFrEF: Ischemic with multiple chronically occluded vessels.  Not a bypass candidate given likely infarct in those areas.  While ideally he would be on medical therapy, he has NYHA class II symptoms and is limited by his blood pressure.  Doubt there is much incremental benefit to be gained from aggressively titrating medical therapy, especially since with adequate dialysis his pressures have improved. - Reasonable to stop losartan  - Discussed low-dose metoprolol , patient would prefer to hold off - Will need repeat echo in the future, doubt ejection fraction will improve significantly - Will need ICD discussion at that time - No role for bypass in this situation  CAD -Hx prior PCIs. LHC 08/25 with multivessel  CAD managed medically as above. No targets for PCI and poor CABG candidate. -Continue aspirin  81 mg daily -Intolerant to statins (rosuvastatin and atorvastatin ) as well as zetia    ESRD -volume managed with HD M,W,F. -Nephrology following.  -Vascular placed new TDC and L AV graft 06/07/24   HTN -BP soft/stable.  - No room to titrate medical therapy  AS: Suspect at least moderate, also complicates BP therapy.  - Follow up with general  cardiology  Follow up in 4 months  Morene Brownie, MD Advanced Heart Failure Mechanical Circulatory Support 09/11/24

## 2024-09-19 ENCOUNTER — Ambulatory Visit: Payer: Self-pay | Admitting: General Surgery

## 2024-09-19 ENCOUNTER — Telehealth (HOSPITAL_BASED_OUTPATIENT_CLINIC_OR_DEPARTMENT_OTHER): Payer: Self-pay

## 2024-09-19 NOTE — Telephone Encounter (Signed)
   Pre-operative Risk Assessment    Patient Name: [name]  DOB: 10-12-1966 MRN: [id number]   Date of last office visit: 09/09/24 with Dr. Zenaida Date of next office visit: NA  Request for Surgical Clearance    Procedure:  Inguinal Hernia surgery  Date of Surgery:  Clearance TBD                                 Surgeon:  Dr. Polly Socks Group or Practice Name:  [facility] Phone number:  [phone] Fax number:  [phone]   Type of Clearance Requested:   - Medical  - Pharmacy:  Hold Aspirin  not indicated   Type of Anesthesia:  General    Additional requests/questions:    SignedAugustin JONETTA Daring   09/19/2024, 12:00 PM

## 2024-09-19 NOTE — Telephone Encounter (Signed)
 Correction. You saw the patient on 09/09/2024, my apologies.   KL

## 2024-09-19 NOTE — Telephone Encounter (Signed)
 Dr. Zenaida  You saw this patient on 10/09/2024. Per protocol we request that you comment on his cardiac risk to proceed with  Inguinal Hernia surgery since it has been less than 2 months since evaluated in the office. Please send your comment to P CV Pre-Op  Pool.  Thank you, Lamarr Satterfield DNP, ANP, AACC.

## 2024-09-21 NOTE — Progress Notes (Unsigned)
 Patient ID: [name], male   DOB: 11/16/1965, 58 y.o.   MRN: [id number]  Reason for Consult: Routine Post Op   Referred by Shelda Atlas, MD  Subjective:     HPI [name] is a 58 y.o. male who underwent left arm angiogram about a month ago to assess for steal.  He had a left arm AVG placed on June 07, 2024.  About 2 months later he presented for follow-up stating that he was having pain and numbness in the left hand and had a small area of skin breakdown pinky finger. Angiogram demonstrated radiographic steal with significant increased flow to the hand with graft compression. Today he reports continues to have pain in his fingers and hand.  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  Past Medical History:  Diagnosis Date   Acute osteomyelitis of right foot (HCC) 05/15/2019   Anemia 06/23/2019   Aortic stenosis    Arthritis    Bursitis    right shoulder   Complication of anesthesia    need benadryal with surgery d/t allergy to pain meds   Coronary artery disease    Diabetes with renal manifestations(250.4) 08/23/2013   Diabetic infection of right foot (HCC)    Diabetic retinopathy associated with type 2 diabetes mellitus (HCC) 06/23/2019   ESRD on hemodialysis (HCC) 07/18/2011   Essential hypertension 10/18/2007   Qualifier: Diagnosis of  By: Kassie MD, Alyce LABOR    Family history of adverse reaction to anesthesia    sister get allegeric reaction to all pain meds   GERD (gastroesophageal reflux disease)    Heart murmur    History of blood transfusion    HYPERLIPIDEMIA 05/16/2008   Qualifier: Diagnosis of  By: Norleen MD, [name] ORN    MRSA INFECTION 10/18/2007   Qualifier: Diagnosis of  By: Wilhemina RMA, Lucy     Myocardial infarction [facility])    Other forms of retinal detachment(361.89) 06/23/2019   PAD (peripheral artery disease)    PONV (postoperative nausea and vomiting)    Prolonged QT interval    Pulmonary hypertension  (HCC)    Secondary hyperparathyroidism 06/23/2019   Sepsis (HCC) 05/14/2019   Family History  Problem Relation Age of Onset   Diabetes Mother    Past Surgical History:  Procedure Laterality Date   A/V FISTULAGRAM N/A 12/29/2023   Procedure: A/V Fistulagram;  Surgeon: Norine Manuelita LABOR, MD;  Location: [facility] INVASIVE CV LAB;  Service: Cardiovascular;  Laterality: N/A;   A/V SHUNT INTERVENTION N/A 03/17/2024   Procedure: A/V SHUNT INTERVENTION;  Surgeon: Gretta Lonni PARAS, MD;  Location: [facility] PV LAB;  Service: Cardiovascular;  Laterality: N/A;  cephalic arch   ABDOMINAL AORTOGRAM N/A 05/17/2019   Procedure: ABDOMINAL AORTOGRAM;  Surgeon: Harvey Carlin BRAVO, MD;  Location: [facility] INVASIVE CV LAB;  Service: Cardiovascular;  Laterality: N/A;   AMPUTATION TOE Right 05/14/2019   Procedure: PARTIAL AMPUTATION SECOND TOE RIGHT FOOT;  Surgeon: Gretel Ozell PARAS, DPM;  Location: [facility] OR;  Service: Podiatry;  Laterality: Right;   AMPUTATION TOE Right 05/23/2019   Procedure: AMPUTATION OF SECOND TOE METATARSAL PHALANGEAL JOINT;  Surgeon: Gretel Ozell PARAS, DPM;  Location: [facility] OR;  Service: Podiatry;  Laterality: Right;   AMPUTATION TOE Right 05/27/2019   Procedure: Amputation Toe;  Surgeon: Gretel Ozell PARAS, DPM;  Location: [facility] OR;  Service: Podiatry;  Laterality: Right;  right third toe   APPLICATION OF WOUND VAC Right 05/27/2019   Procedure:  Application Of Wound Vac;  Surgeon: Gretel Ozell PARAS, DPM;  Location: [facility] OR;  Service: Podiatry;  Laterality: Right;   AV FISTULA PLACEMENT  06-18-11   Right brachiocephalic AVF   BONE BIOPSY Right 05/14/2019   Procedure: OPEN SUPERFICIAL BONE BIOPSY GREAT TOE;  Surgeon: Gretel Ozell PARAS, DPM;  Location: [facility] OR;  Service: Podiatry;  Laterality: Right;   CARDIAC CATHETERIZATION     CORONARY BALLOON ANGIOPLASTY N/A 09/24/2020   Procedure: CORONARY BALLOON ANGIOPLASTY;  Surgeon: Jordan, Peter M, MD;  Location: [facility] INVASIVE CV LAB;  Service: Cardiovascular;  Laterality: N/A;   CORONARY  PRESSURE/FFR STUDY N/A 09/24/2020   Procedure: INTRAVASCULAR PRESSURE WIRE/FFR STUDY;  Surgeon: Jordan, Peter M, MD;  Location: [facility] INVASIVE CV LAB;  Service: Cardiovascular;  Laterality: N/A;   CORONARY STENT INTERVENTION N/A 09/24/2020   Procedure: CORONARY STENT INTERVENTION;  Surgeon: Jordan, Peter M, MD;  Location: [facility] INVASIVE CV LAB;  Service: Cardiovascular;  Laterality: N/A;   EYE SURGERY     left eye for Laser, diabetic retinopathy   FISTULOGRAM Right 11/06/2011   Procedure: FISTULOGRAM;  Surgeon: Redell LITTIE Door, MD;  Location: [facility] CATH LAB;  Service: Cardiovascular;  Laterality: Right;   HEMATOMA EVACUATION Right Feb. 25, 2014   HEMATOMA EVACUATION Right 12/14/2012   Procedure: EVACUATION HEMATOMA;  Surgeon: Redell LITTIE Door, MD;  Location: [facility] OR;  Service: Vascular;  Laterality: Right;   INCISION AND DRAINAGE Right 05/23/2019   Procedure: INCISION AND DRAINAGE OF RIGHT FOOT DEEP SPACE ABSCESS;  Surgeon: Gretel Ozell PARAS, DPM;  Location: [facility] OR;  Service: Podiatry;  Laterality: Right;   INGUINAL HERNIA REPAIR Right 07/26/2024   Procedure: REPAIR, HERNIA, INGUINAL, BILATERAL, ADULT;  Surgeon: Polly Cordella LABOR, MD;  Location: [facility] ORS;  Service: General;  Laterality: Right;   INJECTION OF SILICONE OIL Left 01/09/2023   Procedure: INJECTION OF SILICONE OIL;  Surgeon: Elner Arley LABOR, MD;  Location: [facility] OR;  Service: Ophthalmology;  Laterality: Left;   INSERTION OF ARTERIOVENOUS (AV) ARTEGRAFT ARM Left 06/07/2024   Procedure: INSERTION LEFT UPPER ARM ARTERIOVENOUS GRAFT;  Surgeon: Pearline Norman RAMAN, MD;  Location: [facility] OR;  Service: Vascular;  Laterality: Left;  LEFT UPPER EXTREMITY   INSERTION OF DIALYSIS CATHETER  12/14/2012   Procedure: INSERTION OF DIALYSIS CATHETER;  Surgeon: Redell LITTIE Door, MD;  Location: [facility] OR;  Service: Vascular;;   INSERTION OF DIALYSIS CATHETER Right 01/07/2021   Procedure: INSERTION OF DIALYSIS CATHETER;  Surgeon: Harvey Carlin BRAVO, MD;  Location: [facility] OR;  Service: Vascular;  Laterality:  Right;   INSERTION OF DIALYSIS CATHETER Left 01/08/2021   Procedure: INSERTION OF LEFT INTERNAL JUGULAR DIALYSIS CATHETER, tunneled;  Surgeon: Eliza Lonni RAMAN, MD;  Location: [facility] OR;  Service: Vascular;  Laterality: Left;   INSERTION OF DIALYSIS CATHETER Left 06/07/2024   Procedure: INSERTION OF DIALYSIS CATHETER;  Surgeon: Pearline Norman RAMAN, MD;  Location: [facility] OR;  Service: Vascular;  Laterality: Left;  TUNNELED DIALYSIS   IRRIGATION AND DEBRIDEMENT FOOT Right 05/25/2019   Procedure: INCISION AND DRAINAGE FOOT;  Surgeon: Gretel Ozell PARAS, DPM;  Location: [facility] OR;  Service: Podiatry;  Laterality: Right;   IRRIGATION AND DEBRIDEMENT FOOT Right 05/27/2019   Procedure: IRRIGATION AND DEBRIDEMENT FOOT;  Surgeon: Gretel Ozell PARAS, DPM;  Location: [facility] OR;  Service: Podiatry;  Laterality: Right;   LASER PHOTO ABLATION Left 01/09/2023   Procedure: LASER PHOTO ABLATION;  Surgeon: Elner Arley LABOR, MD;  Location: [facility] OR;  Service: Ophthalmology;  Laterality: Left;   LEFT HEART CATH  AND CORONARY ANGIOGRAPHY N/A 09/24/2020   Procedure: LEFT HEART CATH AND CORONARY ANGIOGRAPHY;  Surgeon: Jordan, Peter M, MD;  Location: [facility] INVASIVE CV LAB;  Service: Cardiovascular;  Laterality: N/A;   LOWER EXTREMITY ANGIOGRAPHY Bilateral 05/17/2019   Procedure: LOWER EXTREMITY ANGIOGRAPHY;  Surgeon: Harvey Carlin BRAVO, MD;  [facility]VAS[facility] CV LAB;  Service: Cardiovascular;  Laterality: Bilateral;   PARS PLANA VITRECTOMY Left 01/09/2023   Procedure: PARS PLANA VITRECTOMY WITH 25 GAUGE AND REPAIR OF RETINAL DETACHMENT;  Surgeon: Elner Arley LABOR, MD;  Location: [facility] OR;  Service: Ophthalmology;  Laterality: Left;   REMOVAL OF A DIALYSIS CATHETER Right 01/08/2021   Procedure: REMOVAL OF TEMPORARY DIALYSIS CATHETER, LEFT FEMORAL VEIN;  Surgeon: Eliza Lonni RAMAN, MD;  Location: [facility] OR;  Service: Vascular;  Laterality: Right;   REVISON OF ARTERIOVENOUS FISTULA Right 12/14/2012   Procedure: REVISON OF upper arm ARTERIOVENOUS FISTULA using  4mmx10cm gortex graft;  Surgeon: Redell LITTIE Door, MD;  Location: [facility] OR;  Service: Vascular;  Laterality: Right;   REVISON OF ARTERIOVENOUS FISTULA Right 12/27/2020   Procedure: RIGHT UPPER EXTREMITY ARTERIOVENOUS FISTULA REVISiON;  Surgeon: Magda Debby SAILOR, MD;  Location: [facility] OR;  Service: Vascular;  Laterality: Right;  PERIPHERAL NERVE BLOCK   RIGHT HEART CATH N/A 06/03/2024   Procedure: RIGHT HEART CATH;  Surgeon: Cherrie Toribio SAUNDERS, MD;  Location: [facility] INVASIVE CV LAB;  Service: Cardiovascular;  Laterality: N/A;   RIGHT/LEFT HEART CATH AND CORONARY ANGIOGRAPHY N/A 08/07/2020   Procedure: RIGHT/LEFT HEART CATH AND CORONARY ANGIOGRAPHY;  Surgeon: Jordan, Peter M, MD;  Location: [facility] INVASIVE CV LAB;  Service: Cardiovascular;  Laterality: N/A;   RIGHT/LEFT HEART CATH AND CORONARY ANGIOGRAPHY N/A 05/31/2024   Procedure: RIGHT/LEFT HEART CATH AND CORONARY ANGIOGRAPHY;  Surgeon: Elmira Newman PARAS, MD;  Location: [facility] INVASIVE CV LAB;  Service: Cardiovascular;  Laterality: N/A;   SOFT TISSUE MASS EXCISION     Right arm, Left leg  for MRSA infection   ULTRASOUND GUIDANCE FOR VASCULAR ACCESS Left 06/07/2024   Procedure: ULTRASOUND GUIDANCE, FOR VASCULAR ACCESS;  Surgeon: Pearline Norman RAMAN, MD;  Location: [facility] OR;  Service: Vascular;  Laterality: Left;   UPPER EXTREMITY INTERVENTION Left 08/29/2024   Procedure: UPPER EXTREMITY INTERVENTION;  Surgeon: Pearline Norman RAMAN, MD;  Location: [facility] INVASIVE CV LAB;  Service: Cardiovascular;  Laterality: Left;   VENOUS ANGIOPLASTY  12/29/2023   Procedure: VENOUS ANGIOPLASTY;  Surgeon: Norine Manuelita LABOR, MD;  Location: [facility] INVASIVE CV LAB;  Service: Cardiovascular;;  Cephalic Arch; Intragraft; Distal Edge of Stent   VENOUS ANGIOPLASTY  03/17/2024   Procedure: VENOUS ANGIOPLASTY;  Surgeon: Gretta Lonni PARAS, MD;  Location: [facility];  Service: Cardiovascular;;    Short Social History:  Social History   Tobacco Use   Smoking status: Never    Passive exposure: Past    Smokeless tobacco: Never  Substance Use Topics   Alcohol  use: Not Currently    Allergies  Allergen Reactions   Crestor [Rosuvastatin]     Other reaction(s): Muscle Pain   Fentanyl  Itching    Patient received Fentanyl  during procedure and had reaction, medicinal intervention applied.    Lipitor [Atorvastatin ]     Other reaction(s): Muscle Pain   Amoxicillin Itching    Did it involve swelling of the face/tongue/throat, SOB, or low BP? Unknown Did it involve sudden or severe rash/hives, skin peeling, or any reaction on the inside of your mouth or nose? Unknown Did you need to seek medical attention at a hospital or doctor's office?  Unknown When did it last happen?      20 years ago If all above answers are NO, may proceed with cephalosporin use.    Cefazolin  Swelling and Other (See Comments)    LIP SWELLING, patient unsure if this was stress related. No other airway symptoms.   Chlorhexidine  Hives    Patient has blistering   Hydromorphone  Itching    Patient states he may take with benadryl . Makes feet itch.   Oxycodone  Rash and Other (See Comments)   Percocet [Oxycodone -Acetaminophen ] Itching and Other (See Comments)    Legs only. Itching and burning feeling. Can tolerate with hydroxyzine     Lyrica [Pregabalin]     Vision problem, nervousness   Morphine  Other (See Comments)    Other reaction(s): rash/itching All narcotics per pt Can take with benadryl    Tape Hives    Adhesive tape = blisters; PAPER tape only!   Gabapentin  Nausea And Vomiting    Stomach issues, vision problems, nervousness Cannot tolerate it every day    Current Outpatient Medications  Medication Sig Dispense Refill   aspirin  EC 81 MG tablet Take 1 tablet (81 mg total) by mouth daily. 90 tablet 3   B Complex-C-Biotin-E-Min-FA (DIALYVITE 5000) 5 MG TABS Take 1 tablet by mouth daily.     Continuous Blood Gluc Sensor (FREESTYLE LIBRE 14 DAY SENSOR) MISC See admin instructions.     EPINEPHrine  (EPIPEN   2-PAK) 0.3 mg/0.3 mL IJ SOAJ injection Inject 0.3 mg into the muscle as needed for anaphylaxis. 2 each 0   Evolocumab  (REPATHA  SURECLICK) 140 MG/ML SOAJ Inject 140 mg into the skin every 14 (fourteen) days. 6 mL 3   gabapentin  (NEURONTIN ) 100 MG capsule Take 100 mg by mouth daily as needed (Pain).     hydrOXYzine  (ATARAX ) 50 MG tablet Take 50 mg by mouth 3 (three) times daily as needed for itching.     nitroGLYCERIN  (NITROSTAT ) 0.4 MG SL tablet Place 0.4 mg under the tongue every 5 (five) minutes as needed for chest pain.     pantoprazole  (PROTONIX ) 40 MG tablet Take 40 mg by mouth daily as needed (Heartburn).     prednisoLONE  acetate (PRED FORTE ) 1 % ophthalmic suspension Place 1 drop into the left eye daily.     sevelamer  carbonate (RENVELA ) 800 MG tablet Take 1,600-2,400 mg by mouth 3 (three) times daily with meals.     sodium zirconium cyclosilicate  (LOKELMA ) 10 g PACK packet Take 10 g by mouth every Tuesday, Thursday, Saturday, and Sunday.     Tenapanor HCl, CKD, (XPHOZAH) 30 MG TABS Take 30 mg by mouth daily.     traZODone  (DESYREL ) 150 MG tablet Take 150 mg by mouth at bedtime.     triamcinolone cream (KENALOG) 0.1 % Apply 1 Application topically 2 (two) times daily.     No current facility-administered medications for this visit.    REVIEW OF SYSTEMS  All other systems were reviewed and are negative     Objective:  Objective   Vitals:   09/23/24 1143  BP: 120/63  Pulse: 82  Resp: 18  Temp: 98.1 F (36.7 C)  TempSrc: Temporal  SpO2: 99%  Weight: 167 lb (75.8 kg)  Height: 5' 10 (1.778 m)   Body mass index is 23.96 kg/m.  Physical Exam General: no acute distress Cardiac: hemodynamically stable Extremities: Left arm with multiple circular wounds throughout the forearm and hand.  There is 1 on the MCP joint of the pinky as well as at the wrist that have been  very slow healing. Strong thrill and graft.  Nonpalpable radial  Data: Left arm angiogram from  08/29/2024 Impression: Radiographic steal with significant increased flow into the hand with the graft compressed versus open.  Steal duplex +---------------------------+-----+-------+--------+                            RightLeft   Comments  +---------------------------+-----+-------+--------+  Brachial                                        +---------------------------+-----+-------+--------+  Radial Ambient                  19 mmHg          +---------------------------+-----+-------+--------+  Radial AV Compression           23 mmHg          +---------------------------+-----+-------+--------+  Ulnar Ambient                                    +---------------------------+-----+-------+--------+  Ulnar AV Compression                             +---------------------------+-----+-------+--------+  2nd Digit Ambient                                +---------------------------+-----+-------+--------+  2nd Digit Ulnar Compression                      +---------------------------+-----+-------+--------+      Assessment/Plan:   [name] is a 58 y.o. male with ESRD on HD who underwent left arm angiogram for steal symptoms about 1 month ago.  The angiogram demonstrated significant increased flow with the graft compressed. I discussed the options of ligation and continue with the catheter versus proximal incision of inflow and create an axillary loop AVG.  He would like to proceed with revision to axillary loop graft. I explained that if he continues to have steal symptoms after this procedure we would need to perform ligation and he would need to be catheter dependent.   Norman GORMAN Serve MD [facility]

## 2024-09-22 NOTE — Progress Notes (Signed)
 Clinic Note     Referring Practitoner: Tonna Almarie Slice, MD [address] [place],  [place] [address] Reason for Visit:  Chief Complaint  Patient presents with  . Visit Follow Up    HPI    [name] is a 58 y.o. male who presents to Vascular Surgery clinic for evaluation of a nonhealing wound on his right transmetatarsal amputation. He developed the wound on his TMA several months ago and it has not healed and in fact now seems to be growing. He is followed by Dr. Leigh, DPM, who is also concerned. Furthermore, he developed a large blister secondary to a burn on the TMA last week. He has previously had a CT scan that showed tibial disease without clear in-line flow to his foot. We planned for right ipsilateral antegrade angiogram, however over the past 6 weeks, he has been undergoing wound care for a right hernia repair with Dr. Polly. He is now healed from this.  He is also complaining of pain and redness of his left below knee amputation stump. This has started fairly recently within the past few weeks. He wears a prosthetic and is independently ambulatory. He carries a diagnosis of calciphylaxis and is worried that that is causing his stump redness and pain.  Finally, he also has wounds of his left hand, where he underwent dialysis access placement of LUE brachial-axillary AVG at an outside hospital in August 2025. He says he sustained a wound a few days after access placement that still hasn't healed. He also has numbness of left digits 4 and 5. He underwent angiogram of the left arm at this outside facility a few weeks ago that (per report in care everywhere) showed widely patent arterial inflow with significant augmentation of distal flow with graft compression. He has not started using the graft because of pain in his hand and is still using a TDC. He is supposed to see that outside surgeon tomorrow for further management details of what sounds like steal.  [name] is  accompanied.  He is independently ambulatory.  [name] is medically managed on the following relevant vascular medications:  aspirin   Medical History      Past Medical History:  Diagnosis Date  . Acute myocardial infarction of anterolateral wall, initial episode of care (CMS/HHS-HCC)   . Anemia in chronic kidney disease 06/21/2011  . Chronic osteomyelitis (CMS/HHS-HCC) 10/24/2021  . Coronary artery disease   . DM2 (diabetes mellitus, type 2) (CMS/HHS-HCC)   . ESRD on hemodialysis (CMS/HHS-HCC)   . HFrEF (heart failure with reduced ejection fraction) (CMS/HHS-HCC) 05/01/2022  . History of blood transfusion   . HTN (hypertension)   . Hyperkalemia 01/09/2021  . Kidney disease   . MRSA (methicillin resistant Staphylococcus aureus)    in arm, chin and left leg  . Neuropathy of foot, left 10/28/2021  . PAD (peripheral artery disease)   . PONV (postoperative nausea and vomiting)   . Pulmonary hypertension, unspecified (CMS/HHS-HCC) 08/09/2020  . S/P transmetatarsal amputation of foot, right (CMS/HHS-HCC) 08/16/2019  . Seizures (CMS/HHS-HCC)    one time h/o seizure after last heart cath    Past Surgical History:  Procedure Laterality Date  . AMPUTATION FOOT TRANSMETATARSAL Right 07/08/2019   Procedure: AMPUTATION, FOOT; TRANSMETATARSAL;  Surgeon: Cyrilla Franky Fallow, MD;  Location: [facility] OR;  Service: General Surgery;  Laterality: Right;  . AMPUTATION TOE Left 09/01/2021   Procedure: AMPUTATION, TOE; FIRST AND SECOND ;  Surgeon: Darra Dozier Blunt, MD;  Location: [facility]  OR;  Service: General Surgery;  Laterality: Left;  . ANGIOGRAPHY LOWER EXTREMITY Left 10/20/2021   Procedure: ANGIOGRAPHY, LOWER EXTREMITY, UNILATERAL, RADIOLOGICAL SUPERVISION AND INTERPRETATION;  Surgeon: Trudy Arthea Pear, MD;  Location: [facility] OR;  Service: General Surgery;  Laterality: Left;  . AMPUTATION TOE Left 10/20/2021   Procedure: AMPUTATION, TOE; INTERPHALANGEAL JOINT;  Surgeon:  Trudy Arthea Pear, MD;  Location: [facility] OR;  Service: General Surgery;  Laterality: Left;  possible 3rd, 4th, 5th toe amputations  . ANGIOGRAPHY LOWER EXTREMITY Left 10/23/2021   Procedure: ANGIOGRAPHY, LOWER EXTREMITY, UNILATERAL, RADIOLOGICAL SUPERVISION AND INTERPRETATION;  Surgeon: Cyrilla Franky Fallow, MD;  Location: [facility] OR;  Service: General Surgery;  Laterality: Left;  . AMPUTATION FOOT TRANSMETATARSAL Left 05/06/2022   Procedure: AMPUTATION, FOOT; TRANSMETATARSAL;  Surgeon: Leigh Eleanor Sam, DPM;  Location: [facility] OR;  Service: Podiatry;  Laterality: Left;  . ANGIOGRAPHY LOWER EXTREMITY Left 05/21/2022   Procedure: ANGIOGRAPHY, LOWER EXTREMITY, UNILATERAL, RADIOLOGICAL SUPERVISION AND INTERPRETATION;  Surgeon: Cyrilla Franky Fallow, MD;  Location: [facility];  Service: General Surgery;  Laterality: Left;  . DEBRIDEMENT ANKLE/FOOT/TOE Left 05/21/2022   Procedure: DEBRIDEMENT, ANKLE/FOOT/TOE; MUSCLE AND/OR FASCIA (INCLUDES EPIDERMIS, DERMIS, AND SUBCUTANEOUS TISSUE, IF PERFORMED); FIRST 20 SQ CM OR LESS;  Surgeon: Cyrilla Franky Fallow, MD;  Location: [facility];  Service: General Surgery;  Laterality: Left;  . AMPUTATION LEG BELOW KNEE BKA Left 05/28/2022   Procedure: AMPUTATION, LEG, THROUGH TIBIA AND FIBULA;  Surgeon: Cyrilla Franky Fallow, MD;  Location: [facility] OR;  Service: General Surgery;  Laterality: Left;  . REVISION BELOW KNEE AMPUTATION BKA Left 07/19/2022   Procedure: AMPUTATION, LEG, THROUGH TIBIA AND FIBULA; SECONDARY CLOSURE OR SCAR REVISION;  Surgeon: Luke Salt, MD;  Location: [facility];  Service: General Surgery;  Laterality: Left;    Social History   Socioeconomic History  . Marital status: Married  Tobacco Use  . Smoking status: Never    Passive exposure: Never  . Smokeless tobacco: Never  Vaping Use  . Vaping status: Never Used  Substance and Sexual Activity  . Alcohol  use: Not Currently    Comment: Rarely  . Drug  use: Never  . Sexual activity: Defer   Social Drivers of Health   Financial Resource Strain: Low Risk  (07/19/2024)   Overall Financial Resource Strain (CARDIA)   . Difficulty of Paying Living Expenses: Not hard at all  Food Insecurity: No Food Insecurity (07/19/2024)   Hunger Vital Sign   . Worried About Programme Researcher, Broadcasting/film/video in the Last Year: Never true   . Ran Out of Food in the Last Year: Never true  Transportation Needs: Unmet Transportation Needs (07/19/2024)   PRAPARE - Transportation   . Lack of Transportation (Medical): Yes   . Lack of Transportation (Non-Medical): No  Physical Activity: Inactive (01/19/2024)   Received from [facility]   Exercise Vital Sign   . On average, how many days per week do you engage in moderate to strenuous exercise (like a brisk walk)?: 0 days   . On average, how many minutes do you engage in exercise at this level?: 0 min  Stress: Stress Concern Present (01/19/2024)   Received from [facility] Occupational [facility] - Occupational Stress Questionnaire   . Feeling of Stress : Rather much  Housing Stability: Unknown (09/19/2024)   Housing Stability Vital Sign   . Unable to Pay for Housing in the Last Year: No   . Homeless in the Last Year: No  Family History  Problem Relation Age of Onset  . Diabetes Mother   . Stroke Father   . Myocardial Infarction (Heart attack) Father   . Anesthesia problems Neg Hx   . Malignant hyperthermia Neg Hx      Objective Data      Physical Exam Vitals:   09/22/24 1440  BP: (!) 143/78  Pulse: 93  Temp: 36.5 C (97.7 F)  TempSrc: Oral  Weight: 73.5 kg (162 lb)  Height: 177.8 cm (5' 10)  PainSc:   8  PainLoc: Leg    General: Alert and Oriented, NAD. HEENT: Normocephalic, benign. Chest/lungs: Equal expansion.  No acute respiratory difficulty. CV: RRR Extremities:  LUE: faintly palpable radial pulse significantly augmented with graft compression. Multiple small punched out ulcers  of left hand/dorsal forearm. 5/5 grip strength RLE: shallow ulcer with single drop of non-purulent drainage; somewhat macerated wound bed. Blister on tip of the TMA. No surrounding erythema but the entire calf and shin are somewhat swollen. LLE: Redness of the distal BKA stump. Faint excoriations, no frank wounds.    Independent Review of Imaging/Non-invasive testing     CTA as above  Assessment & Plan      [name] is a pleasant 58 y.o. male with multiple ongoing issues.  I discussed with the patient the following:  Right TMA wound. We will plan for right lower extremity angiogram with ipsilateral antegrade approach for possible tibial intervention. Will keep the patient overnight for this so that we can have our limb loss nurse assess his left BKA stump as well (and because he lives relatively far away) Left BKA stump: We discussed that this might be more difficult to address, as he doesn't have revascularization options for it. It is possible this is local irritation from the prosthetic versus ischemia versus calciphylaxis. I asked him to rest the stump as much as possible Left arm access and likely steal. Per his report, finger pressures with and without compression have not been done. Evaluation of arterial inflow is complete. It is possible the wounds are calciphylaxis, not ischemic, but his hand pain/numbness/wounds onset around the time of access creation are concerning. I encouraged him to follow up with his surgeon tomorrow but ensured him that if he wants to transfer care to me to deal with this in the future, I would be happy to do so. .  Treatment plan was discussed and developedas follows:      TIME: I spent a total of 40 minutes in both face-to-face and non-face-to-face activities for this visit on the date of the encounter.

## 2024-09-22 NOTE — Telephone Encounter (Signed)
 Per Dr. Zenaida, Patient is a moderate risk for a moderate risk elective procedure. He has known HFrEF, coronary artery disease, and ESRD. He is well optimized from a volume standpoint and needs no further  optimization or risk stratification prior to planned procedure. Would ensure no missed dialysis sessions prior to surgery.   The patient is doing well from a cardiac perspective. Therefore, based on ACC/AHA guidelines, the patient would be at acceptable risk for the planned procedure without further cardiovascular testing.   Clearance will be forwarded to requesting provider and removed from preop basket.   Rosaline EMERSON Bane, NP-C  09/22/2024, 1:09 PM [address], [address] [place], [place] [address] Office [phone] Fax [phone]

## 2024-09-23 ENCOUNTER — Ambulatory Visit: Attending: Vascular Surgery | Admitting: Vascular Surgery

## 2024-09-23 ENCOUNTER — Encounter: Payer: Self-pay | Admitting: Vascular Surgery

## 2024-09-23 VITALS — BP 120/63 | HR 82 | Temp 98.1°F | Resp 18 | Ht 70.0 in | Wt 167.0 lb

## 2024-09-23 DIAGNOSIS — T82898D Other specified complication of vascular prosthetic devices, implants and grafts, subsequent encounter: Secondary | ICD-10-CM | POA: Diagnosis present

## 2024-09-23 DIAGNOSIS — N186 End stage renal disease: Secondary | ICD-10-CM | POA: Diagnosis present

## 2024-09-26 ENCOUNTER — Telehealth: Payer: Self-pay

## 2024-09-26 NOTE — Telephone Encounter (Signed)
 Patient states that he has an upcoming surgery and needs to wait for scheduling with Dr. Pearline.  Potentially first of the year scheduling for L arm AVG revision.

## 2024-10-06 DIAGNOSIS — S8981XA Other specified injuries of right lower leg, initial encounter: Secondary | ICD-10-CM | POA: Insufficient documentation

## 2024-10-06 NOTE — Telephone Encounter (Signed)
 Patient made aware of labs and to continue Repatha ,

## 2024-10-17 DIAGNOSIS — Z89431 Acquired absence of right foot: Secondary | ICD-10-CM | POA: Insufficient documentation

## 2024-11-01 ENCOUNTER — Encounter: Payer: Self-pay | Admitting: Dermatology

## 2024-11-01 ENCOUNTER — Ambulatory Visit: Admitting: Dermatology

## 2024-11-01 DIAGNOSIS — L281 Prurigo nodularis: Secondary | ICD-10-CM | POA: Diagnosis not present

## 2024-11-01 DIAGNOSIS — L870 Keratosis follicularis et parafollicularis in cutem penetrans: Secondary | ICD-10-CM

## 2024-11-01 MED ORDER — BETAMETHASONE DIPROPIONATE 0.05 % EX OINT: TOPICAL_OINTMENT | Freq: Two times a day (BID) | CUTANEOUS | 11 refills | Status: AC | PRN

## 2024-11-01 MED ORDER — TACROLIMUS 0.1 % EX OINT: TOPICAL_OINTMENT | Freq: Two times a day (BID) | CUTANEOUS | 11 refills | Status: DC

## 2024-11-01 NOTE — Patient Instructions (Addendum)
 VISIT SUMMARY:  During your visit, we discussed your persistent skin lesions and discomfort. We reviewed your history of psoriasis and the recent development of tender lesions on your back and arms. We also considered your history of kidney failure and dialysis, which may be contributing to your skin condition.  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 Given your history of kidney failure and dialysis, Kyrle's disease is more likely. We are switching your treatment from clobetasol to betamethasone , which you should apply twice daily for two weeks. If the nodules area clears in one week, discontinue betamethasone  and start using tacrolimus  daily.   Continue using tacrolimus  until a flare returns, then resume betamethasone  for two weeks.   We may consider injections for persistent nodules if the topical treatments are not effective. Additionally, continue using moisturizers to keep your skin hydrated.  INSTRUCTIONS:  Apply betamethasone  twice daily for two weeks. If the psoriasis area clears in one week, discontinue betamethasone  and start using tacrolimus  daily. Continue using tacrolimus  until a flare returns, then resume betamethasone  for two weeks. Follow up with us  if the lesions persist or worsen, and we may consider injections for persistent nodules.    Important Information   Due to recent changes in healthcare laws, you may see results of your pathology and/or laboratory studies on MyChart before the doctors have had a chance to review them. We understand that in some cases there may be results that are confusing or concerning to you. Please understand that not all results are received at the same time and often the doctors may need to interpret multiple results in order to provide you with the best plan of care or course of treatment.  Therefore, we ask that you please give us  2 business days to thoroughly review all your results before contacting the office for clarification. Should we see a critical lab result, you will be contacted sooner.     If You Need Anything After Your Visit   If you have any questions or concerns for your doctor, please call our main line at [phone]. If no one answers, please leave a voicemail as directed and we will return your call as soon as possible. Messages left after 4 pm will be answered the following business day.    You may also send us  a message via MyChart. We typically respond to MyChart messages within 1-2 business days.  For prescription refills, please ask your pharmacy to contact our office. Our fax number is [phone].  If you have an urgent issue when the clinic is closed that cannot wait until the next business day, you can page your doctor at the number below.     Please note that while we do our best to be available for urgent issues outside of office hours, we are not available 24/7.    If you have an urgent issue and are unable to reach us , you may choose to seek medical care at your doctor's office, retail clinic, urgent care center, or emergency room.   If you have a medical emergency, please immediately call 911 or go to the emergency department. In the event of inclement weather, please call our main line at [phone] for an update on the status of any delays or closures.  Dermatology Medication Tips: Please keep the boxes that topical medications come in in  order to help keep track of the instructions about where and how to use these. Pharmacies typically print the medication instructions only on the boxes and not directly on the medication tubes.   If your medication is too expensive, please contact our office at [phone] or send us  a message through MyChart.    We are unable to tell what your co-pay for medications will be in advance as this is  different depending on your insurance coverage. However, we may be able to find a substitute medication at lower cost or fill out paperwork to get insurance to cover a needed medication.    If a prior authorization is required to get your medication covered by your insurance company, please allow us  1-2 business days to complete this process.   Drug prices often vary depending on where the prescription is filled and some pharmacies may offer cheaper prices.   The website www.goodrx.com contains coupons for medications through different pharmacies. The prices here do not account for what the cost may be with help from insurance (it may be cheaper with your insurance), but the website can give you the price if you did not use any insurance.  - You can print the associated coupon and take it with your prescription to the pharmacy.  - You may also stop by our office during regular business hours and pick up a GoodRx coupon card.  - If you need your prescription sent electronically to a different pharmacy, notify our office through [facility] or by phone at [phone]

## 2024-11-01 NOTE — Progress Notes (Signed)
 "  New Patient Visit   Subjective  [name] is a 59 y.o. male who presents for the following: Psoriasis  [name] is a 59 year old male with psoriasis who presents with persistent skin lesions and discomfort. He is accompanied by his wife.  He has a history of psoriasis (self diagnosed and never biopsied) and is currently experiencing persistent skin lesions that are sore and tender. Clobetasol has been used, which helps with older lesions but not with newer ones. Concerns about skin thinning due to prolonged steroid use have led him to take breaks from the medication, using it for two days and then stopping for two days. Aquaphor is also used to manage symptoms.  Lesions appear on his back. Lesions on his back resemble 'chicken pox' and are not itchy but sore. Lesions on his arms are tender, and he tries to avoid picking at them, although sometimes applies alcohol , causing the skin to come off.  He has a history of kidney failure and is on dialysis, HTN, BKA, Diabetes, numerous infections. Skin issues began after starting dialysis.   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 He has a history of diabetes and hypertension, contributing to his kidney failure. Blood pressure and A1c levels are currently well-controlled.  Patient provided verbal consent for the use of an AI-assisted program to generate a detailed after-visit summary. The patient understands that the AI tool is used to support clinical documentation and that all information will be reviewed and verified by the healthcare provider.  The following portions of the chart were reviewed this encounter and updated as appropriate: medications, allergies, medical history  Review of Systems:  No other skin or systemic complaints except as noted in HPI or  Assessment and Plan.  Objective  Well appearing patient in no apparent distress; mood and affect are within normal limits.  A full examination was performed including scalp, head, eyes, ears, nose, lips, neck, chest, axillae, abdomen, back, buttocks, bilateral upper extremities, bilateral lower extremities, hands, feet, fingers, toes, fingernails, and toenails. All findings within normal limits unless otherwise noted below.   Relevant exam findings are noted in the Assessment and Plan.    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 Differential diagnosis includes prurigo nodularis and Kyrle's disease, with Kyrle's disease being more likely due to kidney failure and dialysis. Lesions are sometimes tender but not itchy, though current presentation is atypical. Previous clobetasol treatment provided limited relief. Current presentation does not resemble psoriasis, suggesting , prurigo nodularis secondary to picking at lesions from flares of Kyrle's disease.  - Switched from clobetasol to betamethasone , apply twice daily for two weeks. - If tenderness/irritated area is clear in one week, discontinue betamethasone  and start tacrolimus  daily. - Use tacrolimus  daily until flare returns, then resume betamethasone  for two weeks. - Will consider ILK injections for persistent nodules if topicals are ineffective. - Continue moisturizers to maintain skin hydration.   No follow-ups on file.  I, Jetta Ager, am acting as neurosurgeon for [employer], DO.  Documentation: I have reviewed the above documentation for accuracy and completeness, and I agree with the above.  Delon Lenis,  DO     "

## 2024-11-06 ENCOUNTER — Encounter (HOSPITAL_COMMUNITY): Payer: Self-pay

## 2024-11-06 ENCOUNTER — Ambulatory Visit (HOSPITAL_COMMUNITY)
Admission: EM | Admit: 2024-11-06 | Discharge: 2024-11-06 | Disposition: A | Discharge status: Home / Self Care | Attending: Internal Medicine | Admitting: Internal Medicine

## 2024-11-06 DIAGNOSIS — T24202A Burn of second degree of unspecified site of left lower limb, except ankle and foot, initial encounter: Secondary | ICD-10-CM | POA: Diagnosis not present

## 2024-11-06 MED ORDER — SILVER SULFADIAZINE 1 % EX CREA: TOPICAL_CREAM | Freq: Once | CUTANEOUS | Status: AC

## 2024-11-06 MED ORDER — SILVER SULFADIAZINE 1 % EX CREA
TOPICAL_CREAM | CUTANEOUS | Status: AC
  Filled 2024-11-06: qty 85

## 2024-11-06 MED ORDER — SILVER SULFADIAZINE 1 % EX CREA: 1.0000 | TOPICAL_CREAM | Freq: Two times a day (BID) | CUTANEOUS | 3 refills | Status: AC

## 2024-11-06 NOTE — ED Triage Notes (Signed)
 Patient presents to the office for left leg burn after cooking cube steak in the air flyer.

## 2024-11-06 NOTE — ED Provider Notes (Signed)
 " [facility]    CSN: [id number] Arrival date & time: 11/06/24  1654      History   Chief Complaint Chief Complaint  Patient presents with   Wound Check   Burn    HPI JORGEN WOLFINGER is a 59 y.o. male.   59 year old male who presents urgent care with complaints of a burn on his left anterior thigh.  He reports this happened earlier today when he was using his air [place]lin.  The grease fell onto his leg and he wiped it off quickly with a towel.  He did apply cool water  to the area.  He contacted his sister who is a engineer, civil (consulting) and she told him to put ice on the area and to see urgent care as he would likely need some type of an ointment.  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   Wound Check Pertinent negatives include no chest pain, no abdominal pain and no shortness of breath.  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   Essential hypertension 10/18/2007   Qualifier: Diagnosis of  By: Kassie MD, Alyce LABOR    Family history of adverse reaction to anesthesia    sister get allegeric reaction to all pain meds   GERD (gastroesophageal reflux disease)    Heart murmur    History of blood transfusion    HYPERLIPIDEMIA 05/16/2008   Qualifier: Diagnosis of  By: Norleen MD, [name] ORN    MRSA INFECTION 10/18/2007   Qualifier: Diagnosis of  By: Wilhemina RMA, Lucy     Myocardial infarction (HCC)     Other forms of retinal detachment(361.89) 06/23/2019   PAD (peripheral artery disease)    PONV (postoperative nausea and vomiting)    Prolonged QT interval    Pulmonary hypertension (HCC)    Secondary hyperparathyroidism 06/23/2019   Sepsis (HCC) 05/14/2019    Patient Active Problem List   Diagnosis Date Noted   Partial nontraumatic amputation of right foot (HCC) 10/17/2024   Other specified injuries of right lower leg, initial encounter 10/06/2024   HFrEF (heart failure with reduced ejection fraction) (HCC) 08/26/2024   Pulmonary hypertension due to left heart disease (HCC) 06/08/2024   Ischemic cardiomyopathy 06/08/2024   Acute on chronic systolic heart failure (HCC) 05/31/2024   Complication of AV dialysis fistula 05/31/2024   CAD (coronary artery disease) 05/31/2024   Nonrheumatic aortic valve stenosis 05/26/2024   Murmur 03/30/2024   Acquired absence of left lower extremity below knee (HCC) 12/25/2022   Inflammatory disease of prostate, unspecified 09/29/2022   Infection of wound due to methicillin resistant Staphylococcus aureus (MRSA) 07/23/2022   Bacteremia due to Streptococcus 07/20/2022   S/P BKA (below knee amputation) unilateral, left (HCC) 07/01/2022   Absence of toe 06/13/2022   Depressive disorder 06/12/2022   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) 06/12/2022   Chronic congestive heart failure (HCC) 06/12/2022   Asymptomatic varicose veins of left lower extremity 10/30/2021   ACS (acute coronary syndrome) (HCC) 09/12/2021   Prolonged QT interval 09/12/2021   Unstable angina (HCC) 08/20/2021   Nuclear sclerotic cataract of right eye 06/11/2021   Nuclear sclerotic cataract of left eye 06/11/2021   Posterior subcapsular age-related cataract, left eye 06/11/2021   Stable treated proliferative diabetic retinopathy of left eye determined by examination associated with type 2 diabetes mellitus (HCC) 06/11/2021   Right epiretinal  membrane 06/11/2021   Stable treated proliferative diabetic retinopathy of right eye determined by examination associated with type 2 diabetes mellitus (HCC) 06/11/2021   Statin intolerance 03/26/2021   Moderate tricuspid regurgitation 03/26/2021   Hyperkalemia 01/09/2021   ESRD (end stage renal disease) (HCC)    Dialysis AV fistula malfunction, initial encounter 01/07/2021   NSTEMI (non-ST elevated myocardial infarction) (HCC) 09/21/2020   Coronary artery disease involving native coronary artery of native heart without angina pectoris 08/09/2020   Pulmonary hypertension, unspecified (HCC) 08/09/2020   Pancytopenia (HCC) 08/06/2020   Allergy, unspecified, subsequent encounter 06/28/2020   Anaphylactic shock, unspecified, subsequent encounter 06/28/2020   Lymphedema of both lower extremities 12/29/2019   Hyperphosphatemia 12/11/2019   Encounter for post surgical wound check 08/16/2019   Post-op pain 08/16/2019   S/P transmetatarsal amputation of foot, right (HCC) 08/16/2019   Unspecified protein-calorie malnutrition 07/12/2019   PAD (peripheral artery disease) 06/30/2019   Diabetic retinopathy associated with type 2 diabetes mellitus (HCC) 06/23/2019   Other forms of retinal detachment(361.89) 06/23/2019   Secondary hyperparathyroidism 06/23/2019   Osteomyelitis, unspecified (HCC) 05/31/2019   Gangrene of toe of right foot (HCC)    Ulcer of right foot, with necrosis of bone (HCC)    Abscess of right foot    Diabetic infection of right foot (HCC)    Ulcer of right second toe with necrosis of bone (HCC)    Cellulitis of right foot 05/22/2019   Acute osteomyelitis of right foot (HCC) 05/15/2019   Osteomyelitis of great toe of right foot (HCC) 05/14/2019   Diabetic foot ulcer- right second toe 05/14/2019   Sepsis (HCC) 05/14/2019   Hypothyroidism, unspecified 01/11/2018   Hypercalcemia 06/14/2016   Dyslipidemia 05/11/2015   Hyperglycemia 05/05/2015   Diarrhea, unspecified 02/01/2015    Fever, unspecified 02/01/2015   Pain, unspecified 02/01/2015   Pruritus, unspecified 02/01/2015   Coagulation defect, unspecified 07/07/2014   Encounter for removal of sutures 05/30/2014   Preop cardiovascular exam 04/19/2014   Chest pain 08/23/2013   Type 2 diabetes mellitus (HCC) 08/23/2013   Hemodialysis status 08/23/2013   Chronic kidney disease, stage V (HCC) 08/23/2013   Obesity, unspecified 08/23/2013   Complication of vascular dialysis catheter 12/15/2012   Other fluid overload 12/07/2012   Other specified complication of vascular prosthetic devices, implants and grafts, subsequent encounter 08/06/2012   ESRD on dialysis (HCC) 07/18/2011   Pure hypercholesterolemia 06/23/2011   Iron  deficiency anemia, unspecified 06/21/2011   HYPOGLYCEMIA, UNSPECIFIED 11/13/2008   Disorder resulting from impaired renal function 07/31/2008   Ulcer of lower limb (HCC) 06/29/2008   Mixed hyperlipidemia 05/16/2008   MRSA INFECTION 10/18/2007   Essential hypertension 10/18/2007    Past Surgical History:  Procedure Laterality Date   A/V FISTULAGRAM N/A 12/29/2023   Procedure: A/V Fistulagram;  Surgeon: Norine Manuelita LABOR, MD;  [facility];  Service: Cardiovascular;  Laterality: N/A;   A/V SHUNT INTERVENTION N/A 03/17/2024   Procedure:  A/V SHUNT INTERVENTION;  Surgeon: Gretta Lonni PARAS, MD;  Location: [facility];  Service: Cardiovascular;  Laterality: N/A;  cephalic arch   ABDOMINAL AORTOGRAM N/A 05/17/2019   Procedure: ABDOMINAL AORTOGRAM;  Surgeon: Harvey Carlin BRAVO, MD;  Location: [facility] INVASIVE CV LAB;  Service: Cardiovascular;  Laterality: N/A;   AMPUTATION TOE Right 05/14/2019   Procedure: PARTIAL AMPUTATION SECOND TOE RIGHT FOOT;  Surgeon: Gretel Ozell PARAS, DPM;  Location: [facility];  Service: Podiatry;  Laterality: Right;   AMPUTATION TOE Right 05/23/2019   Procedure: AMPUTATION OF SECOND TOE METATARSAL PHALANGEAL JOINT;  Surgeon: Gretel Ozell PARAS, DPM;  [facility]: [facility];   Service: Podiatry;  Laterality: Right;   AMPUTATION TOE Right 05/27/2019   Procedure: Amputation Toe;  Surgeon: Gretel Ozell PARAS, DPM;  [facility];  Service: Podiatry;  Laterality: Right;  right third toe   APPLICATION OF WOUND VAC Right 05/27/2019   Procedure: Application Of Wound Vac;  Surgeon: Gretel Ozell PARAS, DPM;  [facility];  Service: Podiatry;  Laterality: Right;   AV FISTULA PLACEMENT  06-18-11   Right brachiocephalic AVF   BONE BIOPSY Right 05/14/2019   Procedure: OPEN SUPERFICIAL BONE BIOPSY GREAT TOE;  Surgeon: Gretel Ozell PARAS, DPM;  [facility];  Service: Podiatry;  Laterality: Right;   CARDIAC CATHETERIZATION     CORONARY BALLOON ANGIOPLASTY N/A 09/24/2020   Procedure: CORONARY BALLOON ANGIOPLASTY;  Surgeon: Jordan, Peter M, MD;  Location: [facility] INVASIVE CV LAB;  Service: Cardiovascular;  Laterality: N/A;   CORONARY PRESSURE/FFR STUDY N/A 09/24/2020   Procedure: INTRAVASCULAR PRESSURE WIRE/FFR STUDY;  Surgeon: Jordan, Peter M, MD;  Location: [facility] INVASIVE CV LAB;  Service: Cardiovascular;  Laterality: N/A;   CORONARY STENT INTERVENTION N/A 09/24/2020   Procedure: CORONARY STENT INTERVENTION;  Surgeon: Jordan, Peter M, MD;  Location: [facility] INVASIVE CV LAB;  Service: Cardiovascular;  Laterality: N/A;   EYE SURGERY     left eye for Laser, diabetic retinopathy   FISTULOGRAM Right 11/06/2011   Procedure: FISTULOGRAM;  Surgeon: Redell LITTIE Door, MD;  Location: [facility] CATH LAB;  Service: Cardiovascular;  Laterality: Right;   HEMATOMA EVACUATION Right Feb. 25, 2014   HEMATOMA EVACUATION Right 12/14/2012   Procedure: EVACUATION HEMATOMA;  Surgeon: Redell LITTIE Door, MD;  Location: [facility] OR;  Service: Vascular;  Laterality: Right;   INCISION AND DRAINAGE Right 05/23/2019   Procedure: INCISION AND DRAINAGE OF RIGHT FOOT DEEP SPACE ABSCESS;  Surgeon: Gretel Ozell PARAS, DPM;  [facility];  Service: Podiatry;  Laterality: Right;   INGUINAL HERNIA REPAIR Right 07/26/2024   Procedure: REPAIR, HERNIA, INGUINAL,  BILATERAL, ADULT;  Surgeon: Polly Cordella LABOR, MD;  Location: [facility] ORS;  Service: General;  Laterality: Right;   INJECTION OF SILICONE OIL Left 01/09/2023   Procedure: INJECTION OF SILICONE OIL;  Surgeon: Elner Arley LABOR, MD;  Location: [facility] OR;  Service: Ophthalmology;  Laterality: Left;   INSERTION OF ARTERIOVENOUS (AV) ARTEGRAFT ARM Left 06/07/2024   Procedure: INSERTION LEFT UPPER ARM ARTERIOVENOUS GRAFT;  Surgeon: Pearline Norman RAMAN, MD;  Location: [facility] OR;  Service: Vascular;  Laterality: Left;  LEFT UPPER EXTREMITY   INSERTION OF DIALYSIS CATHETER  12/14/2012   Procedure: INSERTION OF DIALYSIS CATHETER;  Surgeon: Redell LITTIE Door, MD;  Location: [facility] OR;  Service: Vascular;;   INSERTION OF DIALYSIS CATHETER Right 01/07/2021   Procedure: INSERTION OF DIALYSIS CATHETER;  Surgeon: Harvey Carlin BRAVO, MD;  Location: [facility] OR;  Service: Vascular;  Laterality: Right;   INSERTION OF DIALYSIS CATHETER Left 01/08/2021  Procedure: INSERTION OF LEFT INTERNAL JUGULAR DIALYSIS CATHETER, tunneled;  Surgeon: Eliza Lonni RAMAN, MD;  Location: [facility] OR;  Service: Vascular;  Laterality: Left;   INSERTION OF DIALYSIS CATHETER Left 06/07/2024   Procedure: INSERTION OF DIALYSIS CATHETER;  Surgeon: Pearline Norman RAMAN, MD;  Location: [facility] OR;  Service: Vascular;  Laterality: Left;  TUNNELED DIALYSIS   IRRIGATION AND DEBRIDEMENT FOOT Right 05/25/2019   Procedure: INCISION AND DRAINAGE FOOT;  Surgeon: Gretel Ozell PARAS, DPM;  [facility];  Service: Podiatry;  Laterality: Right;   IRRIGATION AND DEBRIDEMENT FOOT Right 05/27/2019   Procedure: IRRIGATION AND DEBRIDEMENT FOOT;  Surgeon: Gretel Ozell PARAS, DPM;  [facility];  Service: Podiatry;  Laterality: Right;   LASER PHOTO ABLATION Left 01/09/2023   Procedure: LASER PHOTO ABLATION;  Surgeon: Elner Arley LABOR, MD;  Location: [facility] OR;  Service: Ophthalmology;  Laterality: Left;   LEFT HEART CATH AND CORONARY ANGIOGRAPHY N/A 09/24/2020   Procedure: LEFT HEART CATH AND CORONARY ANGIOGRAPHY;   Surgeon: Jordan, Peter M, MD;  Location: [facility] INVASIVE CV LAB;  Service: Cardiovascular;  Laterality: N/A;   LOWER EXTREMITY ANGIOGRAPHY Bilateral 05/17/2019   Procedure: LOWER EXTREMITY ANGIOGRAPHY;  Surgeon: Harvey Carlin BRAVO, MD;  [facility];  Service: Cardiovascular;  Laterality: Bilateral;   PARS PLANA VITRECTOMY Left 01/09/2023   Procedure: PARS PLANA VITRECTOMY WITH 25 GAUGE AND REPAIR OF RETINAL DETACHMENT;  Surgeon: Elner Arley LABOR, MD;  Location: [facility] OR;  Service: Ophthalmology;  Laterality: Left;   REMOVAL OF A DIALYSIS CATHETER Right 01/08/2021   Procedure: REMOVAL OF TEMPORARY DIALYSIS CATHETER, LEFT FEMORAL VEIN;  Surgeon: Eliza Lonni RAMAN, MD;  Location: [facility] OR;  Service: Vascular;  Laterality: Right;   REVISON OF ARTERIOVENOUS FISTULA Right 12/14/2012   Procedure: REVISON OF upper arm ARTERIOVENOUS FISTULA using 74mmx10cm gortex graft;  Surgeon: Redell LITTIE Door, MD;  Location: [facility] OR;  Service: Vascular;  Laterality: Right;   REVISON OF ARTERIOVENOUS FISTULA Right 12/27/2020   Procedure: RIGHT UPPER EXTREMITY ARTERIOVENOUS FISTULA REVISiON;  Surgeon: Magda Debby SAILOR, MD;  [facility];  Service: Vascular;  Laterality: Right;  PERIPHERAL NERVE BLOCK   RIGHT HEART CATH N/A 06/03/2024   Procedure: RIGHT HEART CATH;  Surgeon: Cherrie Toribio SAUNDERS, MD;  [facility];  Service: Cardiovascular;  Laterality: N/A;   RIGHT/LEFT HEART CATH AND CORONARY ANGIOGRAPHY N/A 08/07/2020   Procedure: RIGHT/LEFT HEART CATH AND CORONARY ANGIOGRAPHY;  Surgeon: Jordan, Peter M, MD;  Location: [facility] INVASIVE CV LAB;  Service: Cardiovascular;  Laterality: N/A;   RIGHT/LEFT HEART CATH AND CORONARY ANGIOGRAPHY N/A 05/31/2024   Procedure: RIGHT/LEFT HEART CATH AND CORONARY ANGIOGRAPHY;  Surgeon: Elmira Newman PARAS, MD;  [facility];  Service: Cardiovascular;  Laterality: N/A;   SOFT TISSUE MASS EXCISION     Right arm, Left leg  for MRSA infection   ULTRASOUND GUIDANCE  FOR VASCULAR ACCESS Left 06/07/2024   Procedure: ULTRASOUND GUIDANCE, FOR VASCULAR ACCESS;  Surgeon: Pearline Norman RAMAN, MD;  Location: [facility] OR;  Service: Vascular;  Laterality: Left;   UPPER EXTREMITY INTERVENTION Left 08/29/2024   Procedure: UPPER EXTREMITY INTERVENTION;  Surgeon: Pearline Norman RAMAN, MD;  Location: [facility] INVASIVE CV LAB;  Service: Cardiovascular;  Laterality: Left;   VENOUS ANGIOPLASTY  12/29/2023   Procedure: VENOUS ANGIOPLASTY;  Surgeon: Norine Manuelita LABOR, MD;  [facility];  Service: Cardiovascular;;  Cephalic Arch; Intragraft; Distal Edge of Stent   VENOUS ANGIOPLASTY  03/17/2024   Procedure: VENOUS ANGIOPLASTY;  Surgeon: Gretta Lonni PARAS, MD;  Location: [facility];  Service: Cardiovascular;;       Home Medications    Prior to Admission medications  Medication Sig Start Date End Date Taking? Authorizing Provider  aspirin  EC 81 MG tablet Take 1 tablet (81 mg total) by mouth daily. 06/28/19  Yes Darron Deatrice LABOR, MD  B Complex-C-Biotin-E-Min-FA (DIALYVITE 5000) 5 MG TABS Take 1 tablet by mouth daily. 08/12/24  Yes [provider]  betamethasone  dipropionate (DIPROLENE ) 0.05 % ointment Apply topically 2 (two) times daily as needed (Rash). Apply for 2 weeks then STOP and alternate with Tacrolimus  11/01/24  Yes Alm Delon SAILOR, DO  clopidogrel (PLAVIX) 75 MG tablet Take 75 mg by mouth. 10/06/24 10/06/25 Yes [provider]  Continuous Blood Gluc Sensor (FREESTYLE LIBRE 14 DAY SENSOR) MISC See admin instructions. 06/20/19  Yes [provider]  EPINEPHrine  (EPIPEN  2-PAK) 0.3 mg/0.3 mL IJ SOAJ injection Inject 0.3 mg into the muscle as needed for anaphylaxis. 06/21/24  Yes Patwardhan, Manish J, MD  Evolocumab  (REPATHA  SURECLICK) 140 MG/ML SOAJ Inject 140 mg into the skin every 14 (fourteen) days. 06/28/24  Yes Patwardhan, Manish J, MD  gabapentin  (NEURONTIN ) 100 MG capsule Take 100 mg by mouth daily as needed (Pain).   Yes [provider]   hydrOXYzine  (ATARAX ) 50 MG tablet Take 50 mg by mouth 3 (three) times daily as needed for itching.   Yes [provider]  hydrOXYzine  (VISTARIL ) 50 MG capsule Take by mouth. 10/27/24  Yes [provider]  Methoxy PEG-Epoetin  Beta (MIRCERA IJ) 100 mcg. 08/19/24 08/18/25 Yes [provider]  nitroGLYCERIN  (NITROSTAT ) 0.4 MG SL tablet Place 0.4 mg under the tongue every 5 (five) minutes as needed for chest pain.   Yes [provider]  ondansetron  (ZOFRAN -ODT) 8 MG disintegrating tablet Take 8 mg by mouth every 8 (eight) hours as needed. 09/30/24  Yes [provider]  pantoprazole  (PROTONIX ) 40 MG tablet Take 40 mg by mouth daily as needed (Heartburn).   Yes [provider]  silver  sulfADIAZINE  (SILVADENE ) 1 % cream Apply 1 Application topically 2 (two) times daily. Apply to the affected area twice daily and cover with nonadherent dressing 11/06/24  Yes Stanislaw Acton A, PA-C  sodium thiosulfate 250 MG/ML injection Inject 25 g into the vein. 08/24/24 08/21/25 Yes [provider]  sodium zirconium cyclosilicate  (LOKELMA ) 10 g PACK packet Take 10 g by mouth every Tuesday, Thursday, Saturday, and Sunday.   Yes [provider]  tacrolimus  (PROTOPIC ) 0.1 % ointment Apply topically 2 (two) times daily. Apply for 2 weeks then STOP and alternate with Mometasone 11/01/24  Yes Alm Delon SAILOR, DO  Tenapanor HCl, CKD, (XPHOZAH) 30 MG TABS Take 30 mg by mouth daily.   Yes [provider]  traZODone  (DESYREL ) 150 MG tablet Take 150 mg by mouth at bedtime.   Yes [provider]  triamcinolone cream (KENALOG) 0.1 % Apply 1 Application topically 2 (two) times daily.   Yes [provider]  prednisoLONE  acetate (PRED FORTE ) 1 % ophthalmic suspension Place 1 drop into the left eye daily. 12/08/23   [provider]  sevelamer  carbonate (RENVELA ) 800 MG tablet Take 1,600-2,400 mg by mouth 3 (three) times daily with meals.  07/25/20   [provider]  loratadine  (CLARITIN ) 10 MG tablet Take 10 mg by mouth daily as needed for allergies.  11/06/19  [provider]    Family History Family History  Problem Relation Age of Onset  Diabetes Mother     Social History Social History[1]   Allergies   Crestor [rosuvastatin], Fentanyl , Lipitor [atorvastatin ], Amoxicillin, Cefazolin , Chlorhexidine , Hydromorphone , Oxycodone , Percocet [oxycodone -acetaminophen ], Lyrica [pregabalin], Morphine , Tape, and Gabapentin    Review of Systems Review of Systems  Constitutional:  Negative for chills and fever.  HENT:  Negative for ear pain and sore throat.   Eyes:  Negative for pain and visual disturbance.  Respiratory:  Negative for cough and shortness of breath.   Cardiovascular:  Negative for chest pain and palpitations.  Gastrointestinal:  Negative for abdominal pain and vomiting.  Genitourinary:  Negative for dysuria and hematuria.  Musculoskeletal:  Negative for arthralgias and back pain.  Skin:  Positive for wound. Negative for color change and rash.  Neurological:  Negative for seizures and syncope.  All other systems reviewed and are negative.    Physical Exam Triage Vital Signs ED Triage Vitals [11/06/24 1740]  Encounter Vitals Group     BP 114/73     Girls Systolic BP Percentile      Girls Diastolic BP Percentile      Boys Systolic BP Percentile      Boys Diastolic BP Percentile      Pulse Rate 97     Resp 18     Temp 97.7 F (36.5 C)     Temp Source Oral     SpO2 97 %     Weight      Height      Head Circumference      Peak Flow      Pain Score      Pain Loc      Pain Education      Exclude from Growth Chart    No data found.  Updated Vital Signs BP 114/73 (BP Location: Left Arm)   Pulse 97   Temp 97.7 F (36.5 C) (Oral)   Resp 18   SpO2 97%   Visual Acuity Right Eye Distance:   Left Eye Distance:   Bilateral Distance:    Right Eye Near:   Left Eye Near:     Bilateral Near:     Physical Exam Vitals and nursing note reviewed.  Constitutional:      General: He is not in acute distress.    Appearance: He is well-developed.  HENT:     Head: Normocephalic and atraumatic.  Eyes:     Conjunctiva/sclera: Conjunctivae normal.  Cardiovascular:     Rate and Rhythm: Normal rate.     Heart sounds: No murmur heard. Pulmonary:     Effort: Pulmonary effort is normal. No respiratory distress.     Breath sounds: Normal breath sounds.  Abdominal:     Palpations: Abdomen is soft.     Tenderness: There is no abdominal tenderness.  Musculoskeletal:        General: No swelling.     Cervical back: Neck supple.     Comments: Left BKA  See Media for burn image  Skin:    General: Skin is warm and dry.     Capillary Refill: Capillary refill takes less than 2 seconds.  Neurological:     Mental Status: He is alert.  Psychiatric:        Mood and Affect: Mood normal.      UC Treatments / Results  Labs (all labs ordered are listed, but only abnormal results are displayed) Labs Reviewed - No data to display  EKG   Radiology No results found.  Procedures Procedures (including critical care  time)  Medications Ordered in UC Medications  silver  sulfADIAZINE  (SILVADENE ) 1 % cream ( Topical Given 11/06/24 1835)    Initial Impression / Assessment and Plan / UC Course  I have reviewed the triage vital signs and the nursing notes.  Pertinent labs & imaging results that were available during my care of the patient were reviewed by me and considered in my medical decision making (see chart for details).     Partial thickness burn of left lower extremity, initial encounter   Partial-thickness burn to the left lower extremity anterior thigh.  Recommend applying Silvadene  to the area twice daily then cover with a nonadherent dressing such as a Telfa or Adaptic.  Can wash the area with soap and water .  Recommend following up for a wound check in 3 to 5  days.  Return sooner if area seems to be worsening.  Final Clinical Impressions(s) / UC Diagnoses   Final diagnoses:  Partial thickness burn of left lower extremity, initial encounter     Discharge Instructions      Partial-thickness burn to the left lower extremity anterior thigh.  Recommend applying Silvadene  to the area twice daily then cover with a nonadherent dressing such as a Telfa or Adaptic.  Can wash the area with soap and water .  Recommend following up for a wound check in 3 to 5 days.  Return sooner if area seems to be worsening.    ED Prescriptions     Medication Sig Dispense Auth. Provider   silver  sulfADIAZINE  (SILVADENE ) 1 % cream Apply 1 Application topically 2 (two) times daily. Apply to the affected area twice daily and cover with nonadherent dressing 85 g Teresa Almarie LABOR, [place]      PDMP not reviewed this encounter.    [1]  Social History Tobacco Use   Smoking status: Never    Passive exposure: Past   Smokeless tobacco: Never  Vaping Use   Vaping status: Never Used  Substance Use Topics   Alcohol  use: Not Currently   Drug use: No     Teresa Almarie LABOR, PA-C 11/06/24 1846  "

## 2024-11-06 NOTE — Discharge Instructions (Addendum)
 Partial-thickness burn to the left lower extremity anterior thigh.  Recommend applying Silvadene  to the area twice daily then cover with a nonadherent dressing such as a Telfa or Adaptic.  Can wash the area with soap and water .  Recommend following up for a wound check in 3 to 5 days.  Return sooner if area seems to be worsening.

## 2024-11-08 ENCOUNTER — Other Ambulatory Visit: Payer: Self-pay | Admitting: Dermatology

## 2024-11-08 DIAGNOSIS — L281 Prurigo nodularis: Secondary | ICD-10-CM

## 2024-11-08 DIAGNOSIS — L870 Keratosis follicularis et parafollicularis in cutem penetrans: Secondary | ICD-10-CM

## 2024-11-15 ENCOUNTER — Other Ambulatory Visit: Payer: Self-pay

## 2024-11-15 DIAGNOSIS — L281 Prurigo nodularis: Secondary | ICD-10-CM

## 2024-11-15 DIAGNOSIS — L870 Keratosis follicularis et parafollicularis in cutem penetrans: Secondary | ICD-10-CM

## 2024-11-23 ENCOUNTER — Other Ambulatory Visit: Payer: Self-pay

## 2024-11-23 ENCOUNTER — Telehealth: Payer: Self-pay

## 2024-11-23 NOTE — Telephone Encounter (Signed)
 error

## 2024-12-26 ENCOUNTER — Ambulatory Visit: Admitting: Dermatology

## 2024-12-27 ENCOUNTER — Ambulatory Visit (HOSPITAL_COMMUNITY): Admit: 2024-12-27 | Admitting: General Surgery

## 2024-12-29 ENCOUNTER — Ambulatory Visit (HOSPITAL_COMMUNITY)

## 2025-01-11 SURGERY — REPAIR, HERNIA, INGUINAL, ADULT
Anesthesia: General | Laterality: Left
